# Patient Record
Sex: Male | Born: 1960 | Race: Black or African American | Hispanic: No | Marital: Single | Smoking: Current every day smoker
Health system: Southern US, Community
[De-identification: ages and names within clinical notes are randomized; demographics above are authoritative.]

## PROBLEM LIST (undated history)

## (undated) DIAGNOSIS — I10 Essential (primary) hypertension: Secondary | ICD-10-CM

## (undated) DIAGNOSIS — E119 Type 2 diabetes mellitus without complications: Secondary | ICD-10-CM

## (undated) DIAGNOSIS — D649 Anemia, unspecified: Secondary | ICD-10-CM

## (undated) DIAGNOSIS — N186 End stage renal disease: Secondary | ICD-10-CM

## (undated) DIAGNOSIS — I739 Peripheral vascular disease, unspecified: Secondary | ICD-10-CM

## (undated) DIAGNOSIS — Z992 Dependence on renal dialysis: Secondary | ICD-10-CM

## (undated) DIAGNOSIS — M25512 Pain in left shoulder: Secondary | ICD-10-CM

## (undated) HISTORY — PX: APPENDECTOMY: SHX54

## (undated) HISTORY — PX: OTHER SURGICAL HISTORY: SHX169

---

## 1998-10-02 ENCOUNTER — Emergency Department (HOSPITAL_COMMUNITY): Admission: EM | Admit: 1998-10-02 | Discharge: 1998-10-02 | Payer: Self-pay | Admitting: Emergency Medicine

## 1998-10-02 ENCOUNTER — Encounter: Payer: Self-pay | Admitting: Emergency Medicine

## 1999-08-10 ENCOUNTER — Encounter: Payer: Self-pay | Admitting: Emergency Medicine

## 1999-08-10 ENCOUNTER — Emergency Department (HOSPITAL_COMMUNITY): Admission: EM | Admit: 1999-08-10 | Discharge: 1999-08-10 | Payer: Self-pay | Admitting: Emergency Medicine

## 1999-08-30 ENCOUNTER — Ambulatory Visit (HOSPITAL_COMMUNITY): Admission: RE | Admit: 1999-08-30 | Discharge: 1999-08-30 | Payer: Self-pay | Admitting: Family Medicine

## 1999-08-30 ENCOUNTER — Encounter: Payer: Self-pay | Admitting: Family Medicine

## 1999-09-19 ENCOUNTER — Ambulatory Visit (HOSPITAL_BASED_OUTPATIENT_CLINIC_OR_DEPARTMENT_OTHER): Admission: RE | Admit: 1999-09-19 | Discharge: 1999-09-20 | Payer: Self-pay | Admitting: Orthopedic Surgery

## 2001-04-30 ENCOUNTER — Emergency Department (HOSPITAL_COMMUNITY): Admission: EM | Admit: 2001-04-30 | Discharge: 2001-04-30 | Payer: Self-pay | Admitting: Emergency Medicine

## 2001-04-30 ENCOUNTER — Encounter: Payer: Self-pay | Admitting: Emergency Medicine

## 2004-12-07 ENCOUNTER — Emergency Department (HOSPITAL_COMMUNITY): Admission: EM | Admit: 2004-12-07 | Discharge: 2004-12-07 | Payer: Self-pay | Admitting: Emergency Medicine

## 2006-02-18 ENCOUNTER — Emergency Department (HOSPITAL_COMMUNITY): Admission: EM | Admit: 2006-02-18 | Discharge: 2006-02-18 | Payer: Self-pay | Admitting: Emergency Medicine

## 2009-12-31 ENCOUNTER — Emergency Department (HOSPITAL_COMMUNITY)
Admission: EM | Admit: 2009-12-31 | Discharge: 2009-12-31 | Payer: Self-pay | Source: Home / Self Care | Admitting: Emergency Medicine

## 2009-12-31 ENCOUNTER — Ambulatory Visit: Payer: Self-pay | Admitting: Vascular Surgery

## 2010-05-17 ENCOUNTER — Emergency Department (HOSPITAL_COMMUNITY)
Admission: EM | Admit: 2010-05-17 | Discharge: 2010-05-17 | Payer: Self-pay | Source: Home / Self Care | Admitting: Emergency Medicine

## 2010-05-20 LAB — CBC
MCH: 32.7 pg (ref 26.0–34.0)
MCHC: 34 g/dL (ref 30.0–36.0)
Platelets: 179 10*3/uL (ref 150–400)
WBC: 9.8 10*3/uL (ref 4.0–10.5)

## 2010-05-20 LAB — COMPREHENSIVE METABOLIC PANEL
AST: 64 U/L — ABNORMAL HIGH (ref 0–37)
CO2: 25 mEq/L (ref 19–32)
Calcium: 9.5 mg/dL (ref 8.4–10.5)
Chloride: 107 mEq/L (ref 96–112)
Creatinine, Ser: 0.87 mg/dL (ref 0.4–1.5)
GFR calc Af Amer: >60 mL/min (ref >60)
GFR calc non Af Amer: >60 mL/min (ref >60)

## 2010-05-20 LAB — DIFFERENTIAL
Basophils Absolute: 0 10*3/uL (ref 0.0–0.1)
Basophils Relative: 0 % (ref 0–1)
Lymphocytes Relative: 48 % — ABNORMAL HIGH (ref 12–46)
Lymphs Abs: 4.7 10*3/uL — ABNORMAL HIGH (ref 0.7–4.0)
Monocytes Absolute: 0.7 10*3/uL (ref 0.1–1.0)
Monocytes Relative: 7 % (ref 3–12)
Neutro Abs: 4.2 10*3/uL (ref 1.7–7.7)
Neutrophils Relative %: 43 % (ref 43–77)

## 2010-05-20 LAB — PROTIME-INR: INR: 0.92 (ref 0.00–1.49)

## 2010-07-11 LAB — POCT I-STAT, CHEM 8
Calcium, Ion: 1.15 mmol/L (ref 1.12–1.32)
Glucose, Bld: 135 mg/dL — ABNORMAL HIGH (ref 70–99)
Sodium: 140 mEq/L (ref 135–145)
TCO2: 20 mmol/L (ref 0–100)

## 2010-07-11 LAB — URINALYSIS, ROUTINE W REFLEX MICROSCOPIC

## 2010-09-13 NOTE — Op Note (Signed)
[place]  Patient:    Daniel [name], Daniel [name]                   MRN: [id number] Proc. Date: 09/19/99 Adm. Date:  [id number] Disc. Date: [id number] Attending:  Meriel Flavors                           Operative Report  PREOPERATIVE DIAGNOSIS:  Displaced comminuted depressed intra-articular os calcis fracture, left heel.  POSTOPERATIVE DIAGNOSIS:  Displaced comminuted depressed intra-articular os calcis fracture, left heel.  OPERATIVE PROCEDURE:  Open reduction and internal fixation of os calcis fracture, left utilizing titanium os calcis 12-hole plate with ten interfragmentary 4.0 screws.  SURGEON:  Ninetta Lights, M.D.  ASSISTANT:  Aaron Edelman D. Petrarca, P.A.-C.  ANESTHESIA:  General.  BLOOD LOSS:  Minimal.  TOURNIQUET TIME:  One hour and 15 minutes.  SPECIMENS:  None.  CULTURES:  None.  COMPLICATIONS:  None.  DRESSING:  Self-compressive.  DESCRIPTION OF PROCEDURE:  The patient was brought to the operating room and placed on the operating table in the supine position.  After adequate anesthesia had been obtained, turned to appropriate position with appropriate padding and support.  Prepped and draped in the usual sterile fashion. Exsanguinated with elevation and Esmarch.  Tourniquet inflated to 200 mmHg. An L-shaped incision was made under the lateral aspect of the foot, standard approach for os calcis fracture with the incision carried all the way down to the bone, and then the soft tissue sleeve being left as big as possible. Peroneal tendon and sural nerve identified and protected.  Subperiosteal exposure of the entire lateral wall of the os calcis to allow for reduction. Small fluoroscopy used for guidance.  The main intra-articular fragment at the subtalar joint which had been significantly depressed was elevated from below, bringing it up and restoring Bohler angle to an appropriate positive position. I then put a large  Steinmann pin through the base of the os calcis in order to tilt the os calcis back over into a normal slight valgus position from the 20 degrees of varus that it was sitting in post-traumatically.  We then were able to use the fragments on the lateral side and compress these in to restore the normal width of the heel and provide some bony support underneath the elevated intra-articular fragment.  I could get up into the subtalar joint _______ wound to be sure that I was at least reasonably congruent at completion.  A Depuy plate was then placed on the lateral wall of the os calcis, utilizing the small os calcis 12-hole plate.  A series of interfragmentary screws were then placed in order to bring the plate down solidly on the lateral wall of the os calcis, and to interlock this all the way over to the medial wall with the screws in place.  At completion, I had a positive Bohlers angle.  I had restored the height and alignment of the heel to a very reasonable position on all views fluoroscopically as well as clinically.  The wounds were then irrigated and closed with Vicryl and nylon.  Margins of the wound injected with Marcaine.  A sterile compressive bulky trauma dressing applied.  Tourniquet deflated and removed.  Returned to the supine position.  Anesthesia reversed.  Left to the recovery room.  Tolerated surgery well with no complications. DD:  09/20/99 TD:  09/24/99 Job: [id number]

## 2010-09-13 NOTE — Op Note (Signed)
Daniel [name]. [facility]  Patient:    BUDD, LUCKE                   MRN: [id number] Proc. Date: 09/19/99 Adm. Date:  [id number] Disc. Date: [id number] Attending:  Meriel Flavors                           Operative Report  PREOPERATIVE DIAGNOSIS:  Displaced, comminuted, depressed intra-articular os calcis fracture, left heel.  POSTOPERATIVE DIAGNOSIS:  Displaced, comminuted, depressed intra-articular os calcis fracture, left heel.  PROCEDURE:  Open reduction and internal fixation of left os calcis fracture utilizing DePuy os calcis titanium 12-hole plate and 10 interfragmentary screws.  SURGEON:  Ninetta Lights, M.D.  ASSISTANT:  Aaron Edelman D. Petrarca, P.A.-C.  ANESTHESIA:  General.  ESTIMATED BLOOD LOSS:  Minimal.  TOURNIQUET TIME:  1 hour 15 minutes.  SPECIMENS:  None.  CULTURES:  None.  COMPLICATIONS:  None.  DRESSINGS:  Soft compressive with a bulky trauma dressing and splint.  DESCRIPTION OF PROCEDURE:  The patient was brought to the operating room and after adequate anesthesia had been obtained, turned to a prone position with appropriate padding and support.  Tourniquet had been placed about the upper aspect of the left thigh.  Prepped and draped in the usual sterile fashion. Exsanguinated with Esmarch, tourniquet inflated to 350 mmHg.  An L-shaped appropriate trauma incision made over the os calcis, extending from halfway between the fibula and Achilles tendon for the superior limb and halfway between the lateral malleolus and the bottom of the foot for the inferior limb.  The flap was kept very thick, the incision taken down to bone, and then subperiosteal exposure of the os calcis, protecting the peroneal tendons and the sural nerve.  The os calcis was exposed.  This was extended to expose the subtalar joint.  The comminuted fragments were identified.  The main portion of the intra-articular fragment, which was making up a  good 90% of the subtalar joint, was elevated back up to a reasonable position.  A large Steinmann pin was put in the more posteroinferior fragment to pull traction down on this fragment.  I was able then with indirect means to realign the medial and lateral walls and allow compression of these to restore the heel height and appropriate slight valgus position of the os calcis, taking it out of the varus position that it was in from trauma.  With this technique, I could restore the os calcis to a reasonable alignment and recreate a positive Bohlers angle and a reasonably congruent subtalar joint surface.  The lateral wall fragments were then used as graft under the articular fragment and compressed in to restore the normal width of the os calcis.  A 12-hole os calcis plate was then fashioned and placed on the lateral wall of the os calcis and interfragmentary screws were passed through the plate, over to the medial bony cortex.  The large Steinmann pin removed.  All traction removed. Overall alignment assessed clinically as well as with x-ray on an AP and lateral plane and was reasonable and acceptable.  The wound was irrigated and closed with Vicryl and nylon.  Margins of the wound injected with Marcaine. Bulky dressing, trauma dressing, and splint applied.  Tourniquet deflated and removed.  Returned to supine position.  Anesthesia reversed, brought to the recovery room.  Tolerated surgery well with no complications. DD:  09/24/99 TD:  09/26/99 Job:  [id number]

## 2011-09-24 ENCOUNTER — Telehealth: Payer: Self-pay

## 2011-09-24 NOTE — Telephone Encounter (Signed)
LMOM to call.

## 2011-09-25 ENCOUNTER — Telehealth: Payer: Self-pay | Admitting: Gastroenterology

## 2011-09-25 ENCOUNTER — Other Ambulatory Visit: Payer: Self-pay

## 2011-09-25 DIAGNOSIS — Z139 Encounter for screening, unspecified: Secondary | ICD-10-CM

## 2011-09-25 NOTE — Telephone Encounter (Signed)
Called the number [phone] and was given the number of [phone]. Called and got some of the info. Pt has just been diagnosed with diabetes. He hasn't started his meds yet. He will call me back with that info and his blood pressure med. He said he does not have insurance and will be a self pay.

## 2011-09-25 NOTE — Telephone Encounter (Signed)
See phone note of 09/24/2011

## 2011-09-25 NOTE — Telephone Encounter (Signed)
Gastroenterology Pre-Procedure Form  Pt does not have insurance  Request Date: 09/25/2011       Requesting Physician: Dr. Legrand Rams     PATIENT INFORMATION:  Daniel [name] is a 51 y.o., male (DOB=08-29-1960).  PROCEDURE: Procedure(s) requested: colonoscopy Procedure Reason: screening for colon cancer  PATIENT REVIEW QUESTIONS: The patient reports the following:   1. Diabetes Melitis: yes  2. Joint replacements in the past 12 months: no 3. Major health problems in the past 3 months: no 4. Has an artificial valve or MVP:no 5. Has been advised in past to take antibiotics in advance of a procedure like teeth cleaning: no}    MEDICATIONS & ALLERGIES:    Patient reports the following regarding taking any blood thinners:   Plavix? no Aspirin?no Coumadin?  no  Patient confirms/reports the following medications:  Current Outpatient Prescriptions  Medication Sig Dispense Refill  . amLODipine (NORVASC) 5 MG tablet Take 5 mg by mouth daily.      [name] lisinopril (PRINIVIL,ZESTRIL) 20 MG tablet Take 20 mg by mouth daily.      . metFORMIN (GLUCOPHAGE) 500 MG tablet Take 500 mg by mouth daily with breakfast.      . sildenafil (VIAGRA) 100 MG tablet Take 100 mg by mouth as needed.        Patient confirms/reports the following allergies:  No Known Allergies  Patient is appropriate to schedule for requested procedure(s): yes  AUTHORIZATION INFORMATION Primary Insurance:   ID #:  Group #:  Pre-Cert / Auth required: Pre-Cert / Auth #:   Secondary Insurance:   ID #:   Group #:  Pre-Cert / Auth required:  Pre-Cert / Auth #:   No orders of the defined types were placed in this encounter.    SCHEDULE INFORMATION: Procedure has been scheduled as follows:  Date: 10/10/2011           Time: 8:30 AM  Location: [facility] Short Stay  This Gastroenterology Pre-Precedure Form is being routed to the following provider(s) for review: Barney Drain, MD

## 2011-09-25 NOTE — Telephone Encounter (Signed)
Called the number and was told it is not his number and they do not know him.

## 2011-09-25 NOTE — Telephone Encounter (Signed)
Pt returned call to schedule TCS- he can be reached at [phone]

## 2011-09-26 NOTE — Telephone Encounter (Signed)
Continue GLUCOPHAGE.  MOVI PREP SPLIT DOSING, REGULAR BREAKFAST. CLEAR LIQUIDS AFTER 9 AM.

## 2011-09-26 NOTE — Telephone Encounter (Signed)
REVIEWED.  

## 2011-09-29 NOTE — Telephone Encounter (Signed)
Rx and instructions mailed to pt.  

## 2011-10-06 ENCOUNTER — Encounter (HOSPITAL_COMMUNITY): Payer: Self-pay | Admitting: Pharmacy Technician

## 2011-10-08 ENCOUNTER — Telehealth: Payer: Self-pay

## 2011-10-08 NOTE — Telephone Encounter (Signed)
Pt called to cancel colonoscopy appt on 10/10/2011. He needs to reschedule in August (first week). Informed Kim in Endo. And pt on recall list for first week of July to get scheduled for first week of August.

## 2011-10-10 ENCOUNTER — Ambulatory Visit (HOSPITAL_COMMUNITY): Admission: RE | Admit: 2011-10-10 | Payer: Self-pay | Source: Ambulatory Visit | Admitting: Gastroenterology

## 2011-10-10 ENCOUNTER — Encounter (HOSPITAL_COMMUNITY): Admission: RE | Payer: Self-pay | Source: Ambulatory Visit

## 2011-10-10 SURGERY — COLONOSCOPY
Anesthesia: Moderate Sedation

## 2011-11-13 ENCOUNTER — Telehealth: Payer: Self-pay

## 2011-11-13 DIAGNOSIS — Z139 Encounter for screening, unspecified: Secondary | ICD-10-CM

## 2011-11-14 ENCOUNTER — Other Ambulatory Visit: Payer: Self-pay

## 2011-11-14 DIAGNOSIS — Z139 Encounter for screening, unspecified: Secondary | ICD-10-CM

## 2011-11-14 NOTE — Telephone Encounter (Signed)
CONTINUE GLUCOPHAGE.  PREPOPIK-DRINK WATER TO KEEP URINE LIGHT YELLOW.  PT SHOULD DROP OFF RX 3 DAYS PRIOR TO PROCEDURE.

## 2011-11-14 NOTE — Telephone Encounter (Addendum)
Gastroenterology Pre-Procedure Form     Request Date: 11/13/2011    Requesting Physician: Dr. Legrand Rams     PATIENT INFORMATION:  Daniel [name] is a 51 y.o., male (DOB=1960/09/01).  PROCEDURE: Procedure(s) requested: colonoscopy Procedure Reason: screening for colon cancer  PATIENT REVIEW QUESTIONS: The patient reports the following:   1. Diabetes Melitis: yes  2. Joint replacements in the past 12 months: no 3. Major health problems in the past 3 months: no 4. Has an artificial valve or MVP:no 5. Has been advised in past to take antibiotics in advance of a procedure like teeth cleaning: no}    MEDICATIONS & ALLERGIES:    Patient reports the following regarding taking any blood thinners:   Plavix? no Aspirin?no Coumadin?  no  Patient confirms/reports the following medications:  Current Outpatient Prescriptions  Medication Sig Dispense Refill  . amLODipine (NORVASC) 5 MG tablet Take 5 mg by mouth daily.      [name] lisinopril (PRINIVIL,ZESTRIL) 20 MG tablet Take 20 mg by mouth daily.      . metFORMIN (GLUCOPHAGE) 500 MG tablet Take 500 mg by mouth daily with breakfast.      . naphazoline (CLEAR EYES) 0.012 % ophthalmic solution Place 1 drop into both eyes daily as needed. Dry, Red Eyes      . sildenafil (VIAGRA) 100 MG tablet Take 100 mg by mouth as needed. Sexual Arousal        Patient confirms/reports the following allergies:  No Known Allergies  Patient is appropriate to schedule for requested procedure(s): yes  PT SAID HE IS SELF PAY  AUTHORIZATION INFORMATION Primary Insurance:   ID #:   Group #:  Pre-Cert / Auth required: Pre-Cert / Auth #:  Secondary Insurance:   ID #:   Group #:  Pre-Cert / Auth required:  Pre-Cert / Auth #:   No orders of the defined types were placed in this encounter.    SCHEDULE INFORMATION: Procedure has been scheduled as follows:  Date: 12/12/2011     Time: 8:30 AM  Location: [facility] Short Stay  This Gastroenterology  Pre-Precedure Form is being routed to the following provider(s) for review: Barney Drain, MD    Note added to drink lots of liquids to keep urine light yellow.

## 2011-11-19 MED ORDER — SOD PICOSULFATE-MAG OX-CIT ACD 10-3.5-12 MG-GM-GM PO PACK: 1.0000 | PACK | Freq: Once | ORAL | Status: DC

## 2011-11-19 NOTE — Telephone Encounter (Signed)
Rx and presciption mailed to pt . Note to take prescription to the pharmacy at least 3 days prior to procedure.

## 2011-11-19 NOTE — Addendum Note (Signed)
Addended by: Everardo All on: 11/19/2011 10:11 AM   Modules accepted: Orders

## 2011-11-28 ENCOUNTER — Encounter (HOSPITAL_COMMUNITY): Payer: Self-pay | Admitting: Pharmacy Technician

## 2011-12-10 ENCOUNTER — Telehealth: Payer: Self-pay | Admitting: Gastroenterology

## 2011-12-10 NOTE — Telephone Encounter (Signed)
Pt's wife called this afternoon. 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 He will not be able to do his procedure in the morning and would like to [facility] it for next Friday. Please call the wife back at [phone]

## 2011-12-11 NOTE — Telephone Encounter (Signed)
Called Kim to cancel his procedure. Called Pt's wife to let her know that both doctors are off next Friday but Tamela Oddi will call your next week to [facility] him.

## 2011-12-12 ENCOUNTER — Encounter (HOSPITAL_COMMUNITY): Admission: RE | Payer: Self-pay | Source: Ambulatory Visit

## 2011-12-12 ENCOUNTER — Ambulatory Visit (HOSPITAL_COMMUNITY): Admission: RE | Admit: 2011-12-12 | Payer: Self-pay | Source: Ambulatory Visit | Admitting: Gastroenterology

## 2011-12-12 SURGERY — COLONOSCOPY
Anesthesia: Moderate Sedation

## 2011-12-15 NOTE — Telephone Encounter (Signed)
LMOM to call.

## 2011-12-16 NOTE — Telephone Encounter (Signed)
LMOM to call.

## 2011-12-17 ENCOUNTER — Telehealth: Payer: Self-pay | Admitting: *Deleted

## 2011-12-17 ENCOUNTER — Other Ambulatory Visit: Payer: Self-pay

## 2011-12-17 DIAGNOSIS — Z139 Encounter for screening, unspecified: Secondary | ICD-10-CM

## 2011-12-17 NOTE — Telephone Encounter (Signed)
Ms Tia called today to set up a colonoscopy for her husband. She is receiving chemo and will be in and out, but would like for you to call her back. Thanks.

## 2011-12-18 NOTE — Telephone Encounter (Signed)
Pt is rescheduled for 01/23/2012 @ 11:00 AM with Dr. Oneida Alar. Will route to her to address his diabetics meds for instructions on his prep.

## 2011-12-18 NOTE — Telephone Encounter (Signed)
Pt is rescheduled for 01/23/2012 at 11:00 AM with Dr. Oneida Alar.

## 2011-12-18 NOTE — Telephone Encounter (Signed)
REVIEWED.  

## 2011-12-31 ENCOUNTER — Telehealth: Payer: Self-pay

## 2011-12-31 NOTE — Telephone Encounter (Signed)
Gastroenterology Pre-Procedure Form   Pt is self pay. ( Had previous Rx for Prepopik) Wife Lenell Antu gave info for triage  Request Date: 12/31/2011      Requesting Physician: Legrand Rams     PATIENT INFORMATION:  Daniel [name] is a 51 y.o., male (DOB=August 18, 1960).  PROCEDURE: Procedure(s) requested: colonoscopy Procedure Reason: screening for colon cancer  PATIENT REVIEW QUESTIONS: The patient reports the following:   1. Diabetes Melitis: yes  2. Joint replacements in the past 12 months: no 3. Major health problems in the past 3 months: no 4. Has an artificial valve or MVP:no 5. Has been advised in past to take antibiotics in advance of a procedure like teeth cleaning: no}    MEDICATIONS & ALLERGIES:    Patient reports the following regarding taking any blood thinners:   Plavix? No Aspirin?no Coumadin?  no  Patient confirms/reports the following medications:  Current Outpatient Prescriptions  Medication Sig Dispense Refill  . amLODipine (NORVASC) 5 MG tablet Take 5 mg by mouth daily.      [name] lisinopril (PRINIVIL,ZESTRIL) 20 MG tablet Take 20 mg by mouth daily.      . metFORMIN (GLUCOPHAGE) 500 MG tablet Take 500 mg by mouth daily with breakfast.      . naphazoline (CLEAR EYES) 0.012 % ophthalmic solution Place 1 drop into both eyes daily as needed. Dry, Red Eyes      . sildenafil (VIAGRA) 100 MG tablet Take 100 mg by mouth as needed. Sexual Arousal      . Sod Picosulfate-Mag Ox-Cit Acd 10-3.5-12 MG-GM-GM PACK Take 1 kit by mouth once.  1 each  0    Patient confirms/reports the following allergies:  No Known Allergies  Patient is appropriate to schedule for requested procedure(s): yes  AUTHORIZATION INFORMATION Primary Insurance:   ID #:   Group #:  Pre-Cert / Auth required: Pre-Cert / Auth #:   Secondary Insurance:   ID #:  Group #Pre-Cert / Auth required:  Pre-Cert / Auth #:   No orders of the defined types were placed in this encounter.    SCHEDULE  INFORMATION: Procedure has been scheduled as follows:  Date: 01/23/2012      Time: 11:00 AM  Location: [facility] Short Stay  This Gastroenterology Pre-Precedure Form is being routed to the following provider(s) for review: Barney Drain, MD

## 2012-01-01 NOTE — Telephone Encounter (Signed)
PT MAY HAVE PREPOPIK SAMPLE.

## 2012-01-01 NOTE — Telephone Encounter (Signed)
LMOM that sample of Prep and instructions are at front for pick up.

## 2012-01-07 NOTE — Telephone Encounter (Signed)
Called and informed pt of prep and instructions at front desk for pick up.

## 2012-01-23 ENCOUNTER — Ambulatory Visit (HOSPITAL_COMMUNITY)
Admission: RE | Admit: 2012-01-23 | Discharge: 2012-01-23 | Disposition: A | Discharge status: Home / Self Care | Payer: Self-pay | Source: Ambulatory Visit | Attending: Gastroenterology | Admitting: Gastroenterology

## 2012-01-23 ENCOUNTER — Encounter (HOSPITAL_COMMUNITY)
Admission: RE | Disposition: A | Discharge status: Home / Self Care | Payer: Self-pay | Source: Ambulatory Visit | Attending: Gastroenterology

## 2012-01-23 ENCOUNTER — Encounter (HOSPITAL_COMMUNITY): Payer: Self-pay | Admitting: *Deleted

## 2012-01-23 DIAGNOSIS — Z1211 Encounter for screening for malignant neoplasm of colon: Secondary | ICD-10-CM

## 2012-01-23 DIAGNOSIS — K5730 Diverticulosis of large intestine without perforation or abscess without bleeding: Secondary | ICD-10-CM

## 2012-01-23 DIAGNOSIS — Z139 Encounter for screening, unspecified: Secondary | ICD-10-CM

## 2012-01-23 DIAGNOSIS — K648 Other hemorrhoids: Secondary | ICD-10-CM

## 2012-01-23 DIAGNOSIS — I10 Essential (primary) hypertension: Secondary | ICD-10-CM | POA: Insufficient documentation

## 2012-01-23 HISTORY — DX: Essential (primary) hypertension: I10

## 2012-01-23 HISTORY — PX: COLONOSCOPY: SHX5424

## 2012-01-23 LAB — GLUCOSE, CAPILLARY: Glucose-Capillary: 124 mg/dL — ABNORMAL HIGH (ref 70–99)

## 2012-01-23 SURGERY — COLONOSCOPY
Anesthesia: Moderate Sedation

## 2012-01-23 MED ORDER — SODIUM CHLORIDE 0.45 % IV SOLN
INTRAVENOUS | Status: DC
  Administered 2012-01-23: 10:00:00 via INTRAVENOUS

## 2012-01-23 MED ORDER — STERILE WATER FOR IRRIGATION IR SOLN
Status: DC | PRN
  Administered 2012-01-23: 10:00:00

## 2012-01-23 MED ORDER — MIDAZOLAM HCL 5 MG/5ML IJ SOLN
INTRAMUSCULAR | Status: AC
  Filled 2012-01-23: qty 10

## 2012-01-23 MED ORDER — MEPERIDINE HCL 100 MG/ML IJ SOLN
INTRAMUSCULAR | Status: DC | PRN
  Administered 2012-01-23: 50 mg via INTRAVENOUS
  Administered 2012-01-23: 25 mg via INTRAVENOUS

## 2012-01-23 MED ORDER — MEPERIDINE HCL 100 MG/ML IJ SOLN
INTRAMUSCULAR | Status: AC
  Filled 2012-01-23: qty 1

## 2012-01-23 MED ORDER — MIDAZOLAM HCL 5 MG/5ML IJ SOLN
INTRAMUSCULAR | Status: DC | PRN
  Administered 2012-01-23: 1 mg via INTRAVENOUS
  Administered 2012-01-23 (×2): 2 mg via INTRAVENOUS
  Administered 2012-01-23: 1 mg via INTRAVENOUS

## 2012-01-23 NOTE — Op Note (Signed)
[facility] [address] [place], [address]   COLONOSCOPY PROCEDURE REPORT  PATIENT: Daniel [name], Daniel [name]  MR#: [id number] BIRTHDATE: 06/29/1960 , 63  yrs. old GENDER: Male ENDOSCOPIST: Barney Drain, MD REFERRED [id number] Fanta, M.D. PROCEDURE DATE:  01/23/2012 PROCEDURE:   Colonoscopy, screening INDICATIONS:average risk patient for colon cancer. MEDICATIONS: Demerol 75 mg IV and Versed 6 mg IV  DESCRIPTION OF PROCEDURE:    Physical exam was performed.  Informed consent was obtained from the patient after explaining the benefits, risks, and alternatives to procedure.  The patient was connected to monitor and placed in left lateral position. Continuous oxygen was provided by nasal cannula and IV medicine administered through an indwelling cannula.  After administration of sedation and rectal exam, the patients rectum was intubated and the [id number])  colonoscope was advanced under direct visualization to the cecum.  The scope was removed slowly by carefully examining the color, texture, anatomy, and integrity mucosa on the way out.  The patient was recovered in endoscopy and discharged home in satisfactory condition.       COLON FINDINGS: Mild diverticulosis was noted in the descending colon and sigmoid colon.  , The colon was otherwise normal.  There was no inflammation, polyps or cancers unless previously stated.  , and Moderate sized internal hemorrhoids were found.  PREP QUALITY: good. CECAL W/D TIME: 12 minutes  COMPLICATIONS: None  ENDOSCOPIC IMPRESSION: 1.   Mild diverticulosis was noted in the descending colon and sigmoid colon 2.   The colon was otherwise normal 3.   Moderate sized internal hemorrhoids   RECOMMENDATIONS: HIGH FIBER DIET TCS IN 10 YEARS       _______________________________ eSignedBarney Drain, MD 01/23/2012 2:53 PM

## 2012-01-23 NOTE — H&P (Signed)
  Primary Care Physician:  Rosita Fire, MD Primary Gastroenterologist:  Dr. Oneida Alar  Pre-Procedure History & Physical: HPI:  Daniel [name] is a 51 y.o. male here for COLON CANCER SCREENING.   Past Medical History  Diagnosis Date  . Hypertension     Past Surgical History  Procedure Date  . Appendectomy   . Left heel surgery     Prior to Admission medications   Medication Sig Start Date End Date Taking? Authorizing Provider  amLODipine (NORVASC) 5 MG tablet Take 5 mg by mouth daily.   Yes Historical Provider, MD  lisinopril (PRINIVIL,ZESTRIL) 20 MG tablet Take 20 mg by mouth daily.   Yes Historical Provider, MD  metFORMIN (GLUCOPHAGE) 500 MG tablet Take 500 mg by mouth 2 (two) times daily.    Yes Historical Provider, MD  sildenafil (VIAGRA) 100 MG tablet Take 100 mg by mouth as needed. Sexual Arousal   Yes Historical Provider, MD  naphazoline (CLEAR EYES) 0.012 % ophthalmic solution Place 1 drop into both eyes daily as needed. Dry, Red Eyes    Historical Provider, MD    Allergies as of 12/17/2011  . (No Known Allergies)    Family History  Problem Relation Age of Onset  . Colon cancer Neg Hx     History   Social History  . Marital Status: Married    Spouse Name: N/A    Number of Children: N/A  . Years of Education: N/A   Occupational History  . Not on file.   Social History Main Topics  . Smoking status: Current Every Day Smoker -- 0.5 packs/day for 30 years  . Smokeless tobacco: Not on file  . Alcohol Use: 7.2 oz/week    12 Cans of beer per week  . Drug Use: No  . Sexually Active:    Other Topics Concern  . Not on file   Social History Narrative  . No narrative on file    Review of Systems: See HPI, otherwise negative ROS   Physical Exam: BP 168/101  Pulse 89  Temp 97.8 F (36.6 C) (Oral)  Resp 17  Ht 5\' 11"  (1.803 m)  Wt 275 lb (124.739 kg)  BMI 38.35 kg/m2  SpO2 95% General:   Alert,  pleasant and cooperative in NAD Head:   Normocephalic and atraumatic. Neck:  Supple; Lungs:  Clear throughout to auscultation.    Heart:  Regular rate and rhythm. Abdomen:  Soft, nontender and nondistended. Normal bowel sounds, without guarding, and without rebound.   Neurologic:  Alert and  oriented x4;  grossly normal neurologically.  Impression/Plan:     SCREENING  Plan:  1. TCS TODAY

## 2012-01-30 ENCOUNTER — Encounter (HOSPITAL_COMMUNITY): Payer: Self-pay | Admitting: Gastroenterology

## 2013-01-11 ENCOUNTER — Emergency Department (HOSPITAL_COMMUNITY)
Admission: EM | Admit: 2013-01-11 | Discharge: 2013-01-11 | Disposition: A | Discharge status: Home / Self Care | Payer: Self-pay | Attending: Emergency Medicine | Admitting: Emergency Medicine

## 2013-01-11 ENCOUNTER — Emergency Department (HOSPITAL_COMMUNITY): Payer: Self-pay

## 2013-01-11 ENCOUNTER — Encounter (HOSPITAL_COMMUNITY): Payer: Self-pay | Admitting: Emergency Medicine

## 2013-01-11 DIAGNOSIS — I10 Essential (primary) hypertension: Secondary | ICD-10-CM | POA: Insufficient documentation

## 2013-01-11 DIAGNOSIS — E119 Type 2 diabetes mellitus without complications: Secondary | ICD-10-CM | POA: Insufficient documentation

## 2013-01-11 DIAGNOSIS — Z79899 Other long term (current) drug therapy: Secondary | ICD-10-CM | POA: Insufficient documentation

## 2013-01-11 DIAGNOSIS — M7501 Adhesive capsulitis of right shoulder: Secondary | ICD-10-CM

## 2013-01-11 DIAGNOSIS — M7500 Adhesive capsulitis of unspecified shoulder: Secondary | ICD-10-CM | POA: Insufficient documentation

## 2013-01-11 DIAGNOSIS — F17200 Nicotine dependence, unspecified, uncomplicated: Secondary | ICD-10-CM | POA: Insufficient documentation

## 2013-01-11 HISTORY — DX: Type 2 diabetes mellitus without complications: E11.9

## 2013-01-11 MED ORDER — PROMETHAZINE HCL 25 MG PO TABS: 25.0000 mg | ORAL_TABLET | Freq: Four times a day (QID) | ORAL | Status: DC | PRN

## 2013-01-11 MED ORDER — ONDANSETRON 4 MG PO TBDP
8.0000 mg | ORAL_TABLET | Freq: Once | ORAL | Status: AC
  Administered 2013-01-11: 8 mg via ORAL
  Filled 2013-01-11: qty 2

## 2013-01-11 MED ORDER — OXYCODONE-ACETAMINOPHEN 5-325 MG PO TABS: 2.0000 | ORAL_TABLET | Freq: Four times a day (QID) | ORAL | Status: DC | PRN

## 2013-01-11 MED ORDER — OXYCODONE-ACETAMINOPHEN 5-325 MG PO TABS
2.0000 | ORAL_TABLET | Freq: Once | ORAL | Status: AC
  Administered 2013-01-11: 2 via ORAL
  Filled 2013-01-11: qty 2

## 2013-01-11 NOTE — ED Notes (Signed)
Patient states he started having bilateral arm and shoulder pain x 6 months ago.   He advised that is has gotten worse and he is now unable to move his R arm up and down.

## 2013-01-11 NOTE — ED Notes (Signed)
Provider at the bedside.  

## 2013-01-11 NOTE — ED Provider Notes (Signed)
CSN: [id number]     Arrival date & time 01/11/13  0720 History   First MD Initiated Contact with Patient 01/11/13 [phone]     Chief Complaint  Patient presents with  . Shoulder Pain  . Arm Pain   (Consider location/radiation/quality/duration/timing/severity/associated sxs/prior Treatment) HPI Comments: Patient is a 52 year old male with history of hypertension and diabetes who presents today with 6 months of gradually worsening shoulder pain. His pain is bilateral, but his right shoulder is worse than his left. The pain is an aching pain. He uses icy hot patches without relief. Movement makes his pain worse and he has recently not been able to lift his arm above his head. He reports that he has been seen by a doctor in [place] for this, but has not had any x-rays and cannot remember the doctor's name. He was told that if this gets worse to come back in. No initial injury. The pain sometimes radiates into his right hip. Currently his hip is not bothering him. No fevers, chills, headache, nausea, vomiting, abdominal pain, weakness, paresthesias.  The history is provided by the patient. No language interpreter was used.    Past Medical History  Diagnosis Date  . Hypertension    Past Surgical History  Procedure Laterality Date  . Appendectomy    . Left heel surgery    . Colonoscopy  01/23/2012    Procedure: COLONOSCOPY;  Surgeon: Danie Binder, MD;  [facility];  Service: Endoscopy;  Laterality: N/A;  11:10 AM   Family History  Problem Relation Age of Onset  . Colon cancer Neg Hx    History  Substance Use Topics  . Smoking status: Current Every Day Smoker -- 0.50 packs/day for 30 years  . Smokeless tobacco: Not on file  . Alcohol Use: 7.2 oz/week    12 Cans of beer per week    Review of Systems  Constitutional: Negative for fever and chills.  Respiratory: Negative for shortness of breath.   Cardiovascular: Negative for chest pain.  Gastrointestinal: Negative for nausea,  vomiting and abdominal pain.  Musculoskeletal: Positive for myalgias and arthralgias.  All other systems reviewed and are negative.    Allergies  Review of patient's allergies indicates no known allergies.  Home Medications   Current Outpatient Rx  Name  Route  Sig  Dispense  Refill  . amLODipine (NORVASC) 5 MG tablet   Oral   Take 5 mg by mouth daily.         [name] lisinopril (PRINIVIL,ZESTRIL) 20 MG tablet   Oral   Take 20 mg by mouth daily.         . metFORMIN (GLUCOPHAGE) 500 MG tablet   Oral   Take 500 mg by mouth 2 (two) times daily.          . naphazoline (CLEAR EYES) 0.012 % ophthalmic solution   Both Eyes   Place 1 drop into both eyes daily as needed. Dry, Red Eyes         . sildenafil (VIAGRA) 100 MG tablet   Oral   Take 100 mg by mouth as needed. Sexual Arousal          BP 193/119  Pulse 69  Temp(Src) 98.1 F (36.7 C) (Oral)  Resp 18  Ht 5\' 11"  (1.803 m)  Wt 250 lb (113.399 kg)  BMI 34.88 kg/m2  SpO2 96% Physical Exam  Nursing note and vitals reviewed. Constitutional: He is oriented to person, place, and time. He appears well-developed  and well-nourished.  Non-toxic appearance. He does not have a sickly appearance. He does not appear ill. No distress.  Laying with the right arm by his side and left arm above head  HENT:  Head: Normocephalic and atraumatic.  Right Ear: External ear normal.  Left Ear: External ear normal.  Nose: Nose normal.  Eyes: Conjunctivae are normal.  Neck: Normal range of motion. No tracheal deviation present.  Cardiovascular: Normal rate, regular rhythm, normal heart sounds, intact distal pulses and normal pulses.   Pulses:      Femoral pulses are 2+ on the right side, and 2+ on the left side. Pulmonary/Chest: Effort normal and breath sounds normal. No stridor.  Abdominal: Soft. He exhibits no distension. There is no tenderness.  Musculoskeletal: Normal range of motion.  Tender to palpation over right a.c. Joint.  Range of motion limited due to pain. Cap refill less than 3 seconds in all fingers. Neurovascularly intact. Sensation intact. Logroll test negative on right  Lymphadenopathy:       Right: No inguinal adenopathy present.       Left: No inguinal adenopathy present.  Neurological: He is alert and oriented to person, place, and time.  Skin: Skin is warm and dry. He is not diaphoretic.  Psychiatric: He has a normal mood and affect. His behavior is normal.    ED Course  Procedures (including critical care time) Labs Review Labs Reviewed - No data to display Imaging Review Dg Shoulder Right  01/11/2013   *RADIOLOGY REPORT*  Clinical Data: Right shoulder pain without injury  RIGHT SHOULDER - 2+ VIEW  Comparison: None.  Findings: No acute fracture or dislocation is noted. Degenerative changes of the acromioclavicular joint are seen.  Mild degenerative changes of the glenohumeral joint are noted as well.  No soft tissue abnormality is noted.  IMPRESSION: Mild degenerative changes without acute abnormality.   Original Report Authenticated By: Inez Catalina, M.D.    MDM   1. Adhesive capsulitis, right   2. Hypertension    Patient presents with right shoulder pain worse over the past 6 months. Now ROM limited in shoulder. Neurovascularly intact. Compartment soft. Sensation intact. XR shows mild degenerative changes without acute abnormality. Patient will be given pain medication and ortho follow up. He is hypertensive and has meds to take, but has not taken them recently. No headache, weakness, numbness, paresthesias. Return instructions given. Vital signs stable for discharge. Discussed case with Dr. Christy Gentles who agrees with plan. Patient / Family / Caregiver informed of clinical course, understand medical decision-making process, and agree with plan.     Elwyn Lade, PA-C 01/11/13 1517

## 2013-01-12 NOTE — ED Provider Notes (Signed)
Medical screening examination/treatment/procedure(s) were performed by non-physician practitioner and as supervising physician I was immediately available for consultation/collaboration.   Sharyon Cable, MD 01/12/13 1145

## 2013-06-28 ENCOUNTER — Encounter (HOSPITAL_COMMUNITY): Payer: Self-pay | Admitting: Emergency Medicine

## 2013-06-28 ENCOUNTER — Emergency Department (HOSPITAL_COMMUNITY)
Admission: EM | Admit: 2013-06-28 | Discharge: 2013-06-29 | Disposition: A | Discharge status: Home / Self Care | Payer: Self-pay | Attending: Emergency Medicine | Admitting: Emergency Medicine

## 2013-06-28 DIAGNOSIS — F1010 Alcohol abuse, uncomplicated: Secondary | ICD-10-CM | POA: Insufficient documentation

## 2013-06-28 DIAGNOSIS — Z5900 Homelessness unspecified: Secondary | ICD-10-CM | POA: Insufficient documentation

## 2013-06-28 DIAGNOSIS — E119 Type 2 diabetes mellitus without complications: Secondary | ICD-10-CM | POA: Insufficient documentation

## 2013-06-28 DIAGNOSIS — I10 Essential (primary) hypertension: Secondary | ICD-10-CM | POA: Insufficient documentation

## 2013-06-28 DIAGNOSIS — F17200 Nicotine dependence, unspecified, uncomplicated: Secondary | ICD-10-CM | POA: Insufficient documentation

## 2013-06-28 DIAGNOSIS — M25512 Pain in left shoulder: Secondary | ICD-10-CM

## 2013-06-28 HISTORY — DX: Pain in left shoulder: M25.512

## 2013-06-28 LAB — RAPID URINE DRUG SCREEN, HOSP PERFORMED
Amphetamines: NOT DETECTED
BARBITURATES: NOT DETECTED
Benzodiazepines: NOT DETECTED
Cocaine: POSITIVE — AB
Opiates: NOT DETECTED
TETRAHYDROCANNABINOL: NOT DETECTED

## 2013-06-28 LAB — CBC
HCT: 43.2 % (ref 39.0–52.0)
HEMOGLOBIN: 15.3 g/dL (ref 13.0–17.0)
MCH: 34.8 pg — ABNORMAL HIGH (ref 26.0–34.0)
MCHC: 35.4 g/dL (ref 30.0–36.0)
MCV: 98.2 fL (ref 78.0–100.0)
Platelets: 171 10*3/uL (ref 150–400)
RBC: 4.4 MIL/uL (ref 4.22–5.81)
RDW: 13.6 % (ref 11.5–15.5)
WBC: 13.5 10*3/uL — ABNORMAL HIGH (ref 4.0–10.5)

## 2013-06-28 LAB — COMPREHENSIVE METABOLIC PANEL
ALK PHOS: 68 U/L (ref 39–117)
ALT: 67 U/L — AB (ref 0–53)
AST: 110 U/L — ABNORMAL HIGH (ref 0–37)
Albumin: 3.6 g/dL (ref 3.5–5.2)
BUN: 16 mg/dL (ref 6–23)
CHLORIDE: 101 meq/L (ref 96–112)
CO2: 21 meq/L (ref 19–32)
Calcium: 9.2 mg/dL (ref 8.4–10.5)
Creatinine, Ser: 1.18 mg/dL (ref 0.50–1.35)
GFR, EST AFRICAN AMERICAN: 80 mL/min — AB (ref >90)
GFR, EST NON AFRICAN AMERICAN: 69 mL/min — AB (ref >90)
GLUCOSE: 127 mg/dL — AB (ref 70–99)
POTASSIUM: 4.1 meq/L (ref 3.7–5.3)
SODIUM: 138 meq/L (ref 137–147)
Total Bilirubin: 0.9 mg/dL (ref 0.3–1.2)
Total Protein: 8.4 g/dL — ABNORMAL HIGH (ref 6.0–8.3)

## 2013-06-28 LAB — ACETAMINOPHEN LEVEL

## 2013-06-28 LAB — ETHANOL: Alcohol, Ethyl (B): <11 mg/dL (ref 0–11)

## 2013-06-28 LAB — SALICYLATE LEVEL: Salicylate Lvl: <2 mg/dL — ABNORMAL LOW (ref 2.8–20.0)

## 2013-06-28 MED ORDER — ALUM & MAG HYDROXIDE-SIMETH 200-200-20 MG/5ML PO SUSP: 30.0000 mL | ORAL | Status: DC | PRN

## 2013-06-28 MED ORDER — LORAZEPAM 1 MG PO TABS: 0.0000 mg | ORAL_TABLET | Freq: Two times a day (BID) | ORAL | Status: DC

## 2013-06-28 MED ORDER — LORAZEPAM 1 MG PO TABS
0.0000 mg | ORAL_TABLET | Freq: Four times a day (QID) | ORAL | Status: DC
  Administered 2013-06-28: 1 mg via ORAL
  Filled 2013-06-28: qty 1

## 2013-06-28 MED ORDER — CLONIDINE HCL 0.2 MG PO TABS: 0.1000 mg | ORAL_TABLET | Freq: Two times a day (BID) | ORAL | Status: DC

## 2013-06-28 MED ORDER — CLONIDINE HCL 0.1 MG PO TABS
0.2000 mg | ORAL_TABLET | Freq: Once | ORAL | Status: AC
  Administered 2013-06-28: 0.2 mg via ORAL
  Filled 2013-06-28: qty 2

## 2013-06-28 MED ORDER — ONDANSETRON HCL 4 MG PO TABS: 4.0000 mg | ORAL_TABLET | Freq: Three times a day (TID) | ORAL | Status: DC | PRN

## 2013-06-28 MED ORDER — CLONIDINE HCL 0.1 MG PO TABS
0.1000 mg | ORAL_TABLET | Freq: Three times a day (TID) | ORAL | Status: DC
Start: 2013-06-28 — End: 2013-06-29
  Administered 2013-06-28: 0.1 mg via ORAL
  Filled 2013-06-28: qty 1

## 2013-06-28 MED ORDER — NICOTINE 21 MG/24HR TD PT24
21.0000 mg | MEDICATED_PATCH | Freq: Every day | TRANSDERMAL | Status: DC
  Administered 2013-06-28: 21 mg via TRANSDERMAL
  Filled 2013-06-28: qty 1

## 2013-06-28 MED ORDER — IBUPROFEN 200 MG PO TABS
600.0000 mg | ORAL_TABLET | Freq: Three times a day (TID) | ORAL | Status: DC | PRN
  Filled 2013-06-28: qty 3

## 2013-06-28 NOTE — ED Notes (Addendum)
Pt requesting [facility] detox.  Suicidal w/o plan. Last drink was last night (5) 40 oz beers.  Has been through detox before. No hx of seizures.

## 2013-06-28 NOTE — ED Provider Notes (Signed)
CSN: [id number]     Arrival date & time 06/28/13  0844 History   First MD Initiated Contact with Patient 06/28/13 [phone]     Chief Complaint  Patient presents with  . Medical Clearance     (Consider location/radiation/quality/duration/timing/severity/associated sxs/prior Treatment) The history is provided by the patient.   Patient here complaining of suicidal ideations without plan. He admits to drinking copious alcohol today as well as cocaine use. States that he drinks 5 40 ounce beers a day. Last check was yesterday. Denies abdominal pain. No vomiting or black or bloody stools. Denies any auditory or visual hallucinations. Has been in rehabilitation before in the past and that was approximately 3 years ago. Denies any prior history of DTs. No current tremors noted. History of this time because of recent job loss as well as he is now homeless Past Medical History  Diagnosis Date  . Hypertension   . Diabetes mellitus without complication    Past Surgical History  Procedure Laterality Date  . Appendectomy    . Left heel surgery    . Colonoscopy  01/23/2012    Procedure: COLONOSCOPY;  Surgeon: Danie Binder, MD;  [facility];  Service: Endoscopy;  Laterality: N/A;  11:10 AM   Family History  Problem Relation Age of Onset  . Colon cancer Neg Hx    History  Substance Use Topics  . Smoking status: Current Every Day Smoker -- 0.50 packs/day for 30 years    Types: Cigarettes  . Smokeless tobacco: Not on file  . Alcohol Use: 7.2 oz/week    12 Cans of beer per week    Review of Systems  All other systems reviewed and are negative.      Allergies  Review of patient's allergies indicates no known allergies.  Home Medications   Current Outpatient Rx  Name  Route  Sig  Dispense  Refill  . oxyCODONE-acetaminophen (PERCOCET/ROXICET) 5-325 MG per tablet   Oral   Take 2 tablets by mouth every 6 (six) hours as needed for pain.   12 tablet   0   . promethazine  (PHENERGAN) 25 MG tablet   Oral   Take 1 tablet (25 mg total) by mouth every 6 (six) hours as needed for nausea.   12 tablet   0    There were no vitals taken for this visit. Physical Exam  Nursing note and vitals reviewed. Constitutional: He is oriented to person, place, and time. He appears well-developed and well-nourished.  Non-toxic appearance. No distress.  HENT:  Head: Normocephalic and atraumatic.  Eyes: Conjunctivae, EOM and lids are normal. Pupils are equal, round, and reactive to light.  Neck: Normal range of motion. Neck supple. No tracheal deviation present. No mass present.  Cardiovascular: Normal rate, regular rhythm and normal heart sounds.  Exam reveals no gallop.   No murmur heard. Pulmonary/Chest: Effort normal and breath sounds normal. No stridor. No respiratory distress. He has no decreased breath sounds. He has no wheezes. He has no rhonchi. He has no rales.  Abdominal: Soft. Normal appearance and bowel sounds are normal. He exhibits no distension. There is no tenderness. There is no rebound and no CVA tenderness.  Musculoskeletal: Normal range of motion. He exhibits no edema and no tenderness.  Neurological: He is alert and oriented to person, place, and time. He has normal strength. No cranial nerve deficit or sensory deficit. GCS eye subscore is 4. GCS verbal subscore is 5. GCS motor subscore is 6.  Skin: Skin is warm and dry. No abrasion and no rash noted.  Psychiatric: His speech is normal. His affect is blunt. He is withdrawn. He expresses suicidal ideation. He expresses no suicidal plans and no homicidal plans.    ED Course  Procedures (including critical care time) Labs Review Labs Reviewed  ACETAMINOPHEN LEVEL  CBC  COMPREHENSIVE METABOLIC PANEL  ETHANOL  SALICYLATE LEVEL  URINE RAPID DRUG SCREEN (HOSP PERFORMED)   Imaging Review No results found.   EKG Interpretation None      MDM   Final diagnoses:  None    Patient is medically  cleared, place and ciwa and seen by behavior health assessment team    Leota Jacobsen, MD 06/28/13 [phone]

## 2013-06-28 NOTE — BH Assessment (Signed)
[place] Assessment Progress Note  At 23:27 [place] from [facility] called reporting that pt is accepted to their facility.  She requests that I call [employer] to have pt enrolled for third party payment.  She adds that she will take care of authorization.  I then called [employer] and spoke to [place].  She reports that [facility] is responsible both for enrollment and for authorization.  She agrees to call Sandy at [facility], then to call us back to let us know.  I informed her that Curlene Dolphin, [facility] will be assuming responsibility for pt at this point.  I then called EDP Dr Jeneen Rinks, who agrees to transfer to [facility].  Jalene Mullet, MA Triage Specialist 06/28/2013 @ 23:39

## 2013-06-28 NOTE — ED Provider Notes (Addendum)
Pt is still  hypertensive. Given clonidine. Pressures are improving. He has been accepted for residential treatment services. Final disposition pending.  Tanna Furry, MD 06/28/13 [id number]  Tanna Furry, MD 06/30/13 [phone]

## 2013-06-28 NOTE — BH Assessment (Signed)
Assessment Note  Daniel [name] is a 53 y.o. separated black male.  He presents at [facility] unaccompanied complaining of problems with alcohol and crack cocaine, as well as some SI.  Stressors: Pt reports that in 05/2012 he lost his job after failing a urine drug screen.  He anticipates receiving one final unemployment check.  He is eligible for reinstatement at work, but first must complete a substance abuse assessment, and the cost is prohibitive for him.  On Monday, 06/27/2013, he and his wife of the past 2.5 years separated by mutual decision with no intention to reconcile.  Pt is now living with his brother, but is uncertain whether he will be allowed to return.  Lethality: Suicidality: Pt endorses passive SI only, feeling that perhaps he would be better off dead.  He denies any thoughts of taking his life, and denies having a suicide plan.  He has never made a suicide attempt or engaged in self mutilation.  He endorses depressed mood and symptoms of depression as noted in the "risk to self" assessment below, but of not, he is not hopeless about the future, and is eager to "get my life back together." Homicidality: Pt denies homicidal thoughts or physical aggression.  Pt denies having access to firearms.  Pt denies having any legal problems at this time.  Pt is calm and cooperative during assessment. Psychosis: Pt denies hallucinations.  Pt does not appear to be responding to internal stimuli and exhibits no delusional thought.  Pt's reality testing appears to be intact. Substance Abuse: Pt reports that for the past 8 - 9 months he has been drinking about five 40 oz beers daily, with most recent use of six 40 oz beers yesterday (06/27/2013).  He has also been using about $100 worth of crack cocaine once or twice a month for an unspecified duration, with most recent use of $200 yesterday.  His first use of alcohol was at 53 y/o, and of cocaine at [age] y/o.  Pt denies any history of seizures or DT's,  but he reports a number of current withdrawal symptoms as noted in the "Additional Social History" section below, with a total CIWA score of 8.  Social History: Pt identifies an uncle, and brother, and a sister as social supports.  He continues to list his estranged wife, Forster Pamer [phone]) as his emergency contact.  He reports that he has a younger brother who drinks heavily, but he denies any history of mental health problems or suicidality in the family.  Pt denies any history of abuse.  He held a job with [employer] before being terminated.  His highest level of education is 11th grade.    Treatment History: Pt was admitted to [facility] for rehabilitation about 3 years ago, resulting in a 2 year period of sobriety, his longest ever.  About 10 years ago he was admitted to a rehab facility in [place] called [facility].  He denies any history of professional outpatient behavioral health treatment, but recently he attended 12-Step meetings for a couple months.  Today pt is seeking admission to a facility for detoxification.   Axis I: Alcohol Dependence 303.90; Cocaine Abuse 305.60; Mood Disorder NOS 296.90 Axis II: Deferred 799.9 Axis III:  Past Medical History  Diagnosis Date  . Hypertension   . Diabetes mellitus without complication   . Shoulder pain, left 06/28/2013   Axis IV: economic problems, housing problems, occupational problems and problems with primary support group Axis V: GAF =  47  Past Medical History:  Past Medical History  Diagnosis Date  . Hypertension   . Diabetes mellitus without complication   . Shoulder pain, left 06/28/2013    Past Surgical History  Procedure Laterality Date  . Appendectomy    . Left heel surgery    . Colonoscopy  01/23/2012    Procedure: COLONOSCOPY;  Surgeon: Danie Binder, MD;  [facility];  Service: Endoscopy;  Laterality: N/A;  11:10 AM    Family History:  Family History  Problem Relation Age of Onset  .  Colon cancer Neg Hx     Social History:  reports that he has been smoking Cigarettes.  He has a 30 pack-year smoking history. He has never used smokeless tobacco. He reports that he drinks about 7.2 ounces of alcohol per week. He reports that he uses illicit drugs ("Crack" cocaine).  Additional Social History:  Alcohol / Drug Use Pain Medications: Denies Prescriptions: Denies Over the Counter: Denies Negative Consequences of Use: Financial;Legal;Personal relationships;Work / School Withdrawal Symptoms: Nausea / Vomiting;Cramps;Tremors;Tingling;Fever / Chills;Sweats;Weakness;Other (Comment) (Visual sensitivity, headache) Substance #1 Name of Substance 1: Alcohol (beer) 1 - Age of First Use: 53 y/o 1 - Amount (size/oz): 200 oz 1 - Frequency: daily 1 - Duration: 8 - 9 months 1 - Last Use / Amount: 240 oz on  06/27/2013 Substance #2 Name of Substance 2: Cocaine (crack) 2 - Age of First Use: 4 or 52 y/o 2 - Amount (size/oz): $100 2 - Frequency: 1 - 2 times a month 2 - Duration: Unspecified 2 - Last Use / Amount: $200 o 06/27/2013  CIWA: CIWA-Ar BP: 178/107 mmHg Pulse Rate: 82 Nausea and Vomiting: mild nausea with no vomiting Tactile Disturbances: none Tremor: two Auditory Disturbances: not present Paroxysmal Sweats: no sweat visible Visual Disturbances: not present Anxiety: two Headache, Fullness in Head: moderate Agitation: normal activity Orientation and Clouding of Sensorium: oriented and can do serial additions CIWA-Ar Total: 8 COWS:    Allergies: No Known Allergies  Home Medications:  (Not in a hospital admission)  OB/GYN Status:  No LMP for male patient.  General Assessment Data Location of Assessment: [facility] ED Is this a Tele or Face-to-Face Assessment?: Face-to-Face Is this an Initial Assessment or a Re-assessment for this encounter?: Initial Assessment Living Arrangements: Other relatives (Brother, in brother's home) Can pt return to current living arrangement?:   (Uncertain) Admission Status: Voluntary Is patient capable of signing voluntary admission?: Yes Transfer from: [facility] Referral Source: Other ([facility])     [facility] Crisis Care Plan Living Arrangements: Other relatives (Brother, in brother's home) Name of Psychiatrist: None Name of Therapist: None  Education Status Is patient currently in school?: No Highest grade of school patient has completed: 11 Contact person: Atif Zappone (estranged wife) [phone]  Risk to self Suicidal Ideation: Yes-Currently Present (Passive only, feeling he would be better off dead.) Suicidal Intent: No Is patient at risk for suicide?: No Suicidal Plan?: No Access to Means: No What has been your use of drugs/alcohol within the last 12 months?: Daily alcohol, intermittent crack cocaine Previous Attempts/Gestures: No How many times?: 0 Other Self Harm Risks: Homelessness, unemployment, financial problems Triggers for Past Attempts: Other (Comment) (Not applicable) Intentional Self Injurious Behavior: None Family Suicide History: No Recent stressful life event(s): Job Loss;Financial Problems;Other (Comment) (Living w/ brother; separation from wife.) Persecutory voices/beliefs?: No Depression: Yes Depression Symptoms: Insomnia;Tearfulness;Isolating;Fatigue;Guilt;Loss of interest in usual pleasures;Feeling worthless/self pity;Feeling angry/irritable Substance abuse history and/or treatment for substance abuse?: Yes (Daily  alcohol, intermittent crack cocaine) Suicide prevention information given to non-admitted patients: Yes  Risk to Others Homicidal Ideation: No Thoughts of Harm to Others: No Current Homicidal Intent: No Current Homicidal Plan: No Access to Homicidal Means: No Identified Victim: None History of harm to others?: No Assessment of Violence: None Noted Violent Behavior Description: Calm/cooperative Does patient have access to weapons?: No (Denies having firearms.) Criminal  Charges Pending?: No Does patient have a court date: No  Psychosis Hallucinations: None noted Delusions: None noted  Mental Status Report Appear/Hygiene: Other (Comment);Disheveled (Paper scrubs) Eye Contact: Fair Motor Activity: Unremarkable Speech: Other (Comment) (Garbled, flat prosody) Level of Consciousness: Other (Comment) (Lethargic) Mood: Depressed Affect: Blunted Anxiety Level: None Thought Processes: Coherent;Relevant Judgement: Unimpaired Orientation: Person;Time;Place;Situation Obsessive Compulsive Thoughts/Behaviors: None  Cognitive Functioning Concentration: Decreased Memory: Recent Intact;Remote Intact IQ: Average Insight: Good Impulse Control: Good Appetite: Fair (Fluctuates) Weight Loss: 0 Weight Gain: 38 (35 - 40# over past 3 months.) Sleep: Decreased (Mid-insomnia, awakening 4 - 5 times a night) Total Hours of Sleep:  (Unspecified) Vegetative Symptoms: None  ADLScreening [facility] Assessment Services) Patient's cognitive ability adequate to safely complete daily activities?: Yes Patient able to express need for assistance with ADLs?: Yes Independently performs ADLs?: Yes (appropriate for developmental age)  Prior Inpatient Therapy Prior Inpatient Therapy: Yes Prior Therapy Dates: 3 years ago: [facility] for rehab Prior Therapy Facilty/Provider(s): 10 years ago: [facility] in [place] for rehab  Prior Outpatient Therapy Prior Outpatient Therapy: No Prior Therapy Dates: Denies any professional outpatient treatment Prior Therapy Facilty/Provider(s): Recent 2 month involvement with 12-Step meetings.  ADL Screening (condition at time of admission) Patient's cognitive ability adequate to safely complete daily activities?: Yes Is the patient deaf or have difficulty hearing?: No Does the patient have difficulty seeing, even when wearing glasses/contacts?: No Does the patient have difficulty concentrating, remembering, or making decisions?: No Patient able  to express need for assistance with ADLs?: Yes Does the patient have difficulty dressing or bathing?: No Independently performs ADLs?: Yes (appropriate for developmental age) Does the patient have difficulty walking or climbing stairs?: No Weakness of Legs: None Weakness of Arms/Hands: None  Home Assistive Devices/Equipment Home Assistive Devices/Equipment: CBG Meter (Reports that he needs reading glasses)    Abuse/Neglect Assessment (Assessment to be complete while patient is alone) Physical Abuse: Denies Verbal Abuse: Denies Sexual Abuse: Denies Exploitation of patient/patient's resources: Denies Self-Neglect: Denies Values / Beliefs Cultural Requests During Hospitalization: None Spiritual Requests During Hospitalization: None   Advance Directives (For Healthcare) Advance Directive: Patient does not have advance directive;Patient would not like information Pre-existing out of facility DNR order (yellow form or pink MOST form): No Nutrition Screen- [facility] Patient's home diet: Carb modified  Additional Information 1:1 In Past 12 Months?: No CIRT Risk: No Elopement Risk: No Does patient have medical clearance?: Yes     Disposition:  Disposition Initial Assessment Completed for this Encounter: Yes Disposition of Patient: Referred to Patient referred to: [facility] (Failing these, will consider at [facility].) After consulting with Patriciaann Clan, PA, it has been determined that pt does not present a life threatening danger to himself or others, but that he would benefit from facility-based detoxification from alcohol.  He recommends seeking placement for pt at [facility] or [facility].  Failing these, pt will be considered for admission to [facility].  At 20:56 I spoke to EDP Dr Jeneen Rinks, who concurs with opinion.  On Site Evaluation by:   Reviewed with Physician:  Patriciaann Clan, PA @ 20:52  Jalene Mullet, MA  Triage Specialist Abbe [place] 06/28/2013 9:27 PM

## 2013-06-28 NOTE — ED Notes (Signed)
Patient has elevated BP and will be cover per [facility] orders. Patient is asymptomatic and voices no complaints at this. Encouragement and support provided and safety maintain,

## 2013-06-28 NOTE — Progress Notes (Signed)
[facility] provided pt with a [employer], highlighting [facility] [place], to help patient establish primary care.

## 2013-06-28 NOTE — Discharge Instructions (Signed)
Alcohol Withdrawal °Anytime drug use is interfering with normal living activities it has become abuse. This includes problems with family and friends. Psychological dependence has developed when your mind tells you that the drug is needed. This is usually followed by physical dependence when a continuing increase of drugs are required to get the same feeling or "high." This is known as addiction or chemical dependency. A person's risk is much higher if there is a history of chemical dependency in the family. °Mild Withdrawal Following Stopping Alcohol, When Addiction or Chemical Dependency Has Developed °When a person has developed tolerance to alcohol, any sudden stopping of alcohol can cause uncomfortable physical symptoms. Most of the time these are mild and consist of tremors in the hands and increases in heart rate, breathing, and temperature. Sometimes these symptoms are associated with anxiety, panic attacks, and bad dreams. There may also be stomach upset. Normal sleep patterns are often interrupted with periods of inability to sleep (insomnia). This may last for 6 months. Because of this discomfort, many people choose to continue drinking to get rid of this discomfort and to try to feel normal. °Severe Withdrawal with Decreased or No Alcohol Intake, When Addiction or Chemical Dependency Has Developed °About five percent of alcoholics will develop signs of severe withdrawal when they stop using alcohol. One sign of this is development of generalized seizures (convulsions). Other signs of this are severe agitation and confusion. This may be associated with believing in things which are not real or seeing things which are not really there (delusions and hallucinations). Vitamin deficiencies are usually present if alcohol intake has been long-term. Treatment for this most often requires hospitalization and close observation. °Addiction can only be helped by stopping use of all chemicals. This is hard but may  save your life. With continual alcohol use, possible outcomes are usually loss of self respect and esteem, violence, and death. °Addiction cannot be cured but it can be stopped. This often requires outside help and the care of professionals. Treatment centers are listed in the yellow pages under Cocaine, Narcotics, and Alcoholics Anonymous. Most hospitals and clinics can refer you to a specialized care center. °It is not necessary for you to go through the uncomfortable symptoms of withdrawal. Your caregiver can provide you with medicines that will help you through this difficult period. Try to avoid situations, friends, or drugs that made it possible for you to keep using alcohol in the past. Learn how to say no. °It takes a long period of time to overcome addictions to all drugs, including alcohol. There may be many times when you feel as though you want a drink. After getting rid of the physical addiction and withdrawal, you will have a lessening of the craving which tells you that you need alcohol to feel normal. Call your caregiver if more support is needed. Learn who to talk to in your family and among your friends so that during these periods you can receive outside help. Alcoholics Anonymous (AA) has helped many people over the years. To get further help, contact AA or call your caregiver, counselor, or clergyperson. Al-Anon and Alateen are support groups for friends and family members of an alcoholic. The people who love and care for an alcoholic often need help, too. For information about these organizations, check your phone directory or call a local alcoholism treatment center.  °SEEK IMMEDIATE MEDICAL CARE IF:  °· You have a seizure. °· You have a fever. °· You experience uncontrolled vomiting or you   SEEK IMMEDIATE MEDICAL CARE IF:   You have a seizure.   You have a fever.   You experience uncontrolled vomiting or you vomit up blood. This may be bright red or look like black coffee grounds.   You have blood in the stool. This may be bright red or appear as a black, tarry, bad-smelling stool.   You become  lightheaded or faint. Do not drive if you feel this way. Have someone else drive you or call 911 for help.   You become more agitated or confused.   You develop uncontrolled anxiety.   You begin to see things that are not really there (hallucinate).  Your caregiver has determined that you completely understand your medical condition, and that your mental state is back to normal. You understand that you have been treated for alcohol withdrawal, have agreed not to drink any alcohol for a minimum of 1 day, will not operate a car or other machinery for 24 hours, and have had an opportunity to ask any questions about your condition.   Document Released: 01/22/2005 Document Revised: 07/07/2011 Document Reviewed: 12/01/2007   ExitCare Patient Information 2014 [employer].

## 2013-06-29 LAB — CBG MONITORING, ED: Glucose-Capillary: 110 mg/dL — ABNORMAL HIGH (ref 70–99)

## 2013-06-29 NOTE — ED Notes (Signed)
Patient discharge via ambulatory with a steady gait. No acute distress noted.

## 2015-08-21 ENCOUNTER — Emergency Department (HOSPITAL_COMMUNITY)
Admission: EM | Admit: 2015-08-21 | Discharge: 2015-08-21 | Disposition: A | Discharge status: Home / Self Care | Payer: Self-pay | Attending: Emergency Medicine | Admitting: Emergency Medicine

## 2015-08-21 ENCOUNTER — Emergency Department (HOSPITAL_COMMUNITY): Payer: Self-pay

## 2015-08-21 ENCOUNTER — Encounter (HOSPITAL_COMMUNITY): Payer: Self-pay | Admitting: Family Medicine

## 2015-08-21 DIAGNOSIS — J029 Acute pharyngitis, unspecified: Secondary | ICD-10-CM | POA: Insufficient documentation

## 2015-08-21 DIAGNOSIS — J028 Acute pharyngitis due to other specified organisms: Secondary | ICD-10-CM

## 2015-08-21 DIAGNOSIS — F17210 Nicotine dependence, cigarettes, uncomplicated: Secondary | ICD-10-CM | POA: Insufficient documentation

## 2015-08-21 DIAGNOSIS — Z79899 Other long term (current) drug therapy: Secondary | ICD-10-CM | POA: Insufficient documentation

## 2015-08-21 DIAGNOSIS — Z8739 Personal history of other diseases of the musculoskeletal system and connective tissue: Secondary | ICD-10-CM | POA: Insufficient documentation

## 2015-08-21 DIAGNOSIS — E119 Type 2 diabetes mellitus without complications: Secondary | ICD-10-CM | POA: Insufficient documentation

## 2015-08-21 DIAGNOSIS — B9789 Other viral agents as the cause of diseases classified elsewhere: Secondary | ICD-10-CM

## 2015-08-21 DIAGNOSIS — R1310 Dysphagia, unspecified: Secondary | ICD-10-CM | POA: Insufficient documentation

## 2015-08-21 DIAGNOSIS — R05 Cough: Secondary | ICD-10-CM

## 2015-08-21 DIAGNOSIS — I10 Essential (primary) hypertension: Secondary | ICD-10-CM | POA: Insufficient documentation

## 2015-08-21 DIAGNOSIS — F17200 Nicotine dependence, unspecified, uncomplicated: Secondary | ICD-10-CM

## 2015-08-21 DIAGNOSIS — R059 Cough, unspecified: Secondary | ICD-10-CM

## 2015-08-21 LAB — RAPID STREP SCREEN (MED CTR MEBANE ONLY): STREPTOCOCCUS, GROUP A SCREEN (DIRECT): NEGATIVE

## 2015-08-21 MED ORDER — IBUPROFEN 800 MG PO TABS
800.0000 mg | ORAL_TABLET | Freq: Once | ORAL | Status: AC
  Administered 2015-08-21: 800 mg via ORAL
  Filled 2015-08-21: qty 1

## 2015-08-21 MED ORDER — BENZONATATE 100 MG PO CAPS
200.0000 mg | ORAL_CAPSULE | Freq: Once | ORAL | Status: AC
  Administered 2015-08-21: 200 mg via ORAL
  Filled 2015-08-21: qty 2

## 2015-08-21 MED ORDER — BENZONATATE 100 MG PO CAPS: 100.0000 mg | ORAL_CAPSULE | Freq: Three times a day (TID) | ORAL | Status: DC

## 2015-08-21 MED ORDER — IBUPROFEN 600 MG PO TABS: 600.0000 mg | ORAL_TABLET | Freq: Four times a day (QID) | ORAL | Status: DC | PRN

## 2015-08-21 NOTE — ED Provider Notes (Signed)
CSN: [id number]     Arrival date & time 08/21/15  1256 History   First MD Initiated Contact with Patient 08/21/15 1722     Chief Complaint  Patient presents with  . Sore Throat  . Cough     (Consider location/radiation/quality/duration/timing/severity/associated sxs/prior Treatment) HPI 55 y.o. male smoker presents to the ED noting a 2-3 month history of cough worsening over the last 2-3 days with intermittent productivity of white sputum accompanied by sore throat with coughing and swallowing. He denies any rhinorrhea or sinus congestion, headache or nausea, vomiting, hemoptysis or hematemesis, diarrhea, abdominal pain. He denies any pain in his chest either with or without coughing. He denies any known sick contacts, nor recent travel. He takes no medications and tried one dose of Tylenol to only limited effect to his pain.  Additionally the patient notes a small area of swelling over his lateral left foot in the plantar region and a smaller one beneath his fifth toe of his left foot which is been present for several months associated with mild tenderness with direct palpation. He is able to ambulate normally and states that he walks a lot. Denies any history of significant trauma to his foot nor prior surgeries. He denies any swelling to his right foot, legs, or abdominal distention.  No hx of CHF, DVT/PE.   Past Medical History  Diagnosis Date  . Hypertension   . Diabetes mellitus without complication ([place])   . Shoulder pain, left 06/28/2013   Past Surgical History  Procedure Laterality Date  . Appendectomy    . Left heel surgery    . Colonoscopy  01/23/2012    Procedure: COLONOSCOPY;  Surgeon: Danie Binder, MD;  [facility];  Service: Endoscopy;  Laterality: N/A;  11:10 AM   Family History  Problem Relation Age of Onset  . Colon cancer Neg Hx    Social History  Substance Use Topics  . Smoking status: Current Every Day Smoker -- 1.00 packs/day for 30 years    Types:  Cigarettes  . Smokeless tobacco: Never Used  . Alcohol Use: 7.2 oz/week    12 Cans of beer per week    Review of Systems  Constitutional: Positive for activity change. Negative for fever, chills and appetite change.  HENT: Positive for sore throat and trouble swallowing. Negative for congestion, ear discharge, ear pain, rhinorrhea, sinus pressure, sneezing and voice change.   Respiratory: Positive for cough. Negative for chest tightness, shortness of breath and wheezing.   Cardiovascular: Negative for chest pain, palpitations and leg swelling.  Gastrointestinal: Negative for nausea, vomiting, abdominal pain and diarrhea.  Genitourinary: Negative for dysuria and difficulty urinating.  Musculoskeletal: Negative for myalgias, back pain and neck pain.  Skin: Negative for rash and wound.  Neurological: Negative for syncope, weakness, light-headedness, numbness and headaches.  All other systems reviewed and are negative.     Allergies  Review of patient's allergies indicates no known allergies.  Home Medications   Prior to Admission medications   Medication Sig Start Date End Date Taking? Authorizing Provider  benzonatate (TESSALON) 100 MG capsule Take 1 capsule (100 mg total) by mouth every 8 (eight) hours. 08/21/15   Zenovia Jarred, DO  cloNIDine (CATAPRES) 0.2 MG tablet Take 0.5 tablets (0.1 mg total) by mouth 2 (two) times daily. 06/28/13   Tanna Furry, MD  ibuprofen (ADVIL,MOTRIN) 600 MG tablet Take 1 tablet (600 mg total) by mouth every 6 (six) hours as needed. 08/21/15   Janeann Merl  Asti Mackley, DO   BP 166/77 mmHg  Pulse 77  Temp(Src) 98.1 F (36.7 C) (Oral)  Resp 16  SpO2 98% Physical Exam  Constitutional: He is oriented to person, place, and time. He appears well-developed and well-nourished. No distress.  HENT:  Head: Normocephalic and atraumatic.  Right Ear: External ear normal.  Left Ear: External ear normal.  Nose: Nose normal.  Mouth/Throat: No oropharyngeal exudate.  Mildly  erythematous posterior oropharynx with no tonsillar exudate. No PTA or RPA visualized.   Eyes: Conjunctivae and EOM are normal. Pupils are equal, round, and reactive to light.  Neck: Normal range of motion. Neck supple.  Cardiovascular: Normal rate, regular rhythm, normal heart sounds and intact distal pulses.   Pulmonary/Chest: Effort normal and breath sounds normal.  Abdominal: Soft. There is no tenderness.  Musculoskeletal: He exhibits no edema or tenderness.       Left foot: There is no tenderness, no swelling, no crepitus, no deformity and no laceration.       Feet:  Lymphadenopathy:    He has cervical adenopathy.  Neurological: He is alert and oriented to person, place, and time. No cranial nerve deficit. Coordination normal.  Skin: Skin is warm and dry. No rash noted. He is not diaphoretic.  Nursing note and vitals reviewed.   ED Course  Procedures (including critical care time) Labs Review Labs Reviewed  RAPID STREP SCREEN (NOT AT [facility])  CULTURE, GROUP A STREP [facility])    Imaging Review Dg Chest 2 View  08/21/2015  CLINICAL DATA:  Productive cough for 2 months with intermittent fever. Smoker. EXAM: CHEST  2 VIEW COMPARISON:  05/17/2010 FINDINGS: The cardiomediastinal silhouette is within normal limits. Lung volumes are unchanged. Airway thickening has slightly increased compared to the prior study. No confluent airspace opacity, edema, pleural effusion, or pneumothorax is identified. Thoracic spondylosis is noted. IMPRESSION: Increased bronchitic changes. Electronically Signed   By: Logan Bores M.D.   On: 08/21/2015 13:42   I have personally reviewed and evaluated these images and lab results as part of my medical decision-making.   EKG Interpretation   Date/Time:  Tuesday August 21 2015 19:02:24 EDT Ventricular Rate:  80 PR Interval:  164 QRS Duration: 84 QT Interval:  440 QTC Calculation: 508 R Axis:   -24 Text Interpretation:  Sinus rhythm Borderline left axis  deviation Minimal  ST depression, inferior leads Prolonged QT interval Baseline wander in  lead(s) V4 No significant change was found Confirmed by Wyvonnia Dusky  MD,  STEPHEN [phone]) on 08/21/2015 7:04:28 PM Also confirmed by Wyvonnia Dusky  MD,  [place] [phone]), editor Gilford Rile, CCT, [place] ([id number])  on 08/22/2015 7:11:31  AM      MDM  55 y.o. male smoker presents to the ED noting an acute on chronic cough, worse over the last few days with accompanying sore throat. Exam as above, consistent with viral pharyngitis with bronchitis. CXR shows evidence of bronchitis. Smoking cessation advised. Rapid strep screen negative, sent for culture. Feel that this is a viral illness precipitating his sx. His foot callous are clearly chronic and likely due to ill-fitting shoes and extensive ambulation. No edema suggestive of CHF nor DVT. Recommended symptomatic care with NSAIDs for pain, tessalon for cough and recommended honey for sore throat treatment and hydration. This plan was discussed with the patient at the bedside and he stated both understanding and agreement. Return precautions were given for significant worsening or concerns.    Final diagnoses:  Acute viral pharyngitis  Cough  Smoking  Zenovia Jarred, DO 08/22/15 [place], MD 08/22/15 [phone]

## 2015-08-21 NOTE — ED Notes (Signed)
Pt here for sore throat, cough and feet swelling.

## 2015-08-21 NOTE — ED Notes (Signed)
Ambulated Pt in hallway. Sat remained at 97%.

## 2015-08-23 LAB — CULTURE, GROUP A STREP (THRC)

## 2016-06-29 ENCOUNTER — Encounter (HOSPITAL_COMMUNITY)
Admission: EM | Disposition: A | Discharge status: Home / Self Care | Payer: Self-pay | Source: Home / Self Care | Attending: Family Medicine

## 2016-06-29 ENCOUNTER — Emergency Department (HOSPITAL_COMMUNITY): Payer: Self-pay

## 2016-06-29 ENCOUNTER — Inpatient Hospital Stay (HOSPITAL_COMMUNITY)
Admission: EM | Admit: 2016-06-29 | Discharge: 2016-07-01 | DRG: 854 | Disposition: A | Discharge status: Home / Self Care | Payer: Self-pay | Attending: Family Medicine | Admitting: Family Medicine

## 2016-06-29 ENCOUNTER — Inpatient Hospital Stay (HOSPITAL_COMMUNITY): Payer: Self-pay | Admitting: Anesthesiology

## 2016-06-29 ENCOUNTER — Encounter (HOSPITAL_COMMUNITY): Payer: Self-pay | Admitting: Emergency Medicine

## 2016-06-29 ENCOUNTER — Inpatient Hospital Stay (HOSPITAL_COMMUNITY): Payer: Self-pay

## 2016-06-29 DIAGNOSIS — I10 Essential (primary) hypertension: Secondary | ICD-10-CM | POA: Diagnosis present

## 2016-06-29 DIAGNOSIS — E119 Type 2 diabetes mellitus without complications: Secondary | ICD-10-CM

## 2016-06-29 DIAGNOSIS — E872 Acidosis: Secondary | ICD-10-CM | POA: Diagnosis present

## 2016-06-29 DIAGNOSIS — L02211 Cutaneous abscess of abdominal wall: Secondary | ICD-10-CM | POA: Diagnosis present

## 2016-06-29 DIAGNOSIS — A419 Sepsis, unspecified organism: Principal | ICD-10-CM | POA: Diagnosis present

## 2016-06-29 DIAGNOSIS — L03311 Cellulitis of abdominal wall: Secondary | ICD-10-CM

## 2016-06-29 DIAGNOSIS — F17210 Nicotine dependence, cigarettes, uncomplicated: Secondary | ICD-10-CM | POA: Diagnosis present

## 2016-06-29 DIAGNOSIS — N200 Calculus of kidney: Secondary | ICD-10-CM | POA: Diagnosis present

## 2016-06-29 DIAGNOSIS — E118 Type 2 diabetes mellitus with unspecified complications: Secondary | ICD-10-CM | POA: Diagnosis present

## 2016-06-29 DIAGNOSIS — N189 Chronic kidney disease, unspecified: Secondary | ICD-10-CM | POA: Diagnosis present

## 2016-06-29 DIAGNOSIS — F1010 Alcohol abuse, uncomplicated: Secondary | ICD-10-CM | POA: Diagnosis present

## 2016-06-29 DIAGNOSIS — Z9049 Acquired absence of other specified parts of digestive tract: Secondary | ICD-10-CM

## 2016-06-29 DIAGNOSIS — R740 Nonspecific elevation of levels of transaminase and lactic acid dehydrogenase [LDH]: Secondary | ICD-10-CM | POA: Diagnosis present

## 2016-06-29 HISTORY — PX: INCISION AND DRAINAGE ABSCESS: SHX5864

## 2016-06-29 LAB — LACTIC ACID, PLASMA
LACTIC ACID, VENOUS: 3.5 mmol/L — AB (ref 0.5–1.9)
Lactic Acid, Venous: 1.7 mmol/L (ref 0.5–1.9)
Lactic Acid, Venous: 0.9 mmol/L (ref 0.5–1.9)
Lactic Acid, Venous: 0.8 mmol/L (ref 0.5–1.9)

## 2016-06-29 LAB — CBC WITH DIFFERENTIAL/PLATELET
BASOS PCT: 0 %
Basophils Absolute: 0 10*3/uL (ref 0.0–0.1)
EOS ABS: 0 10*3/uL (ref 0.0–0.7)
EOS PCT: 0 %
HEMATOCRIT: 40.9 % (ref 39.0–52.0)
Hemoglobin: 14.3 g/dL (ref 13.0–17.0)
Lymphocytes Relative: 32 %
Lymphs Abs: 3.4 10*3/uL (ref 0.7–4.0)
MCH: 35.5 pg — ABNORMAL HIGH (ref 26.0–34.0)
MCHC: 35 g/dL (ref 30.0–36.0)
MCV: 101.5 fL — ABNORMAL HIGH (ref 78.0–100.0)
MONO ABS: 0.9 10*3/uL (ref 0.1–1.0)
MONOS PCT: 8 %
Neutro Abs: 6.2 10*3/uL (ref 1.7–7.7)
Neutrophils Relative %: 60 %
PLATELETS: 124 10*3/uL — AB (ref 150–400)
RBC: 4.03 MIL/uL — ABNORMAL LOW (ref 4.22–5.81)
RDW: 13.2 % (ref 11.5–15.5)
WBC: 10.5 10*3/uL (ref 4.0–10.5)

## 2016-06-29 LAB — APTT: aPTT: 30 seconds (ref 24–36)

## 2016-06-29 LAB — COMPREHENSIVE METABOLIC PANEL
ALBUMIN: 3.4 g/dL — AB (ref 3.5–5.0)
ALT: 49 U/L (ref 17–63)
ALT: 51 U/L (ref 17–63)
ANION GAP: 14 (ref 5–15)
AST: 111 U/L — ABNORMAL HIGH (ref 15–41)
AST: 106 U/L — ABNORMAL HIGH (ref 15–41)
Albumin: 3.4 g/dL — ABNORMAL LOW (ref 3.5–5.0)
Alkaline Phosphatase: 54 U/L (ref 38–126)
Alkaline Phosphatase: 58 U/L (ref 38–126)
Anion gap: 8 (ref 5–15)
BILIRUBIN TOTAL: 1 mg/dL (ref 0.3–1.2)
BUN: 16 mg/dL (ref 6–20)
BUN: 14 mg/dL (ref 6–20)
CHLORIDE: 106 mmol/L (ref 101–111)
CHLORIDE: 110 mmol/L (ref 101–111)
CO2: 18 mmol/L — ABNORMAL LOW (ref 22–32)
CO2: 22 mmol/L (ref 22–32)
Calcium: 9 mg/dL (ref 8.9–10.3)
Calcium: 9 mg/dL (ref 8.9–10.3)
Creatinine, Ser: 1.24 mg/dL (ref 0.61–1.24)
Creatinine, Ser: 1.13 mg/dL (ref 0.61–1.24)
GFR calc Af Amer: >60 mL/min (ref >60)
GFR calc non Af Amer: >60 mL/min (ref >60)
GFR calc non Af Amer: >60 mL/min (ref >60)
GLUCOSE: 109 mg/dL — AB (ref 65–99)
Glucose, Bld: 133 mg/dL — ABNORMAL HIGH (ref 65–99)
POTASSIUM: 3.9 mmol/L (ref 3.5–5.1)
POTASSIUM: 4 mmol/L (ref 3.5–5.1)
SODIUM: 138 mmol/L (ref 135–145)
Sodium: 140 mmol/L (ref 135–145)
TOTAL PROTEIN: 8.1 g/dL (ref 6.5–8.1)
Total Bilirubin: 0.8 mg/dL (ref 0.3–1.2)
Total Protein: 8 g/dL (ref 6.5–8.1)

## 2016-06-29 LAB — URINALYSIS, ROUTINE W REFLEX MICROSCOPIC
Bilirubin Urine: NEGATIVE
Glucose, UA: NEGATIVE mg/dL
KETONES UR: NEGATIVE mg/dL
LEUKOCYTES UA: NEGATIVE
Nitrite: NEGATIVE
PROTEIN: 30 mg/dL — AB
SQUAMOUS EPITHELIAL / LPF: NONE SEEN
Specific Gravity, Urine: 1.008 (ref 1.005–1.030)
pH: 5 (ref 5.0–8.0)

## 2016-06-29 LAB — GLUCOSE, CAPILLARY
GLUCOSE-CAPILLARY: 105 mg/dL — AB (ref 65–99)
GLUCOSE-CAPILLARY: 98 mg/dL (ref 65–99)
Glucose-Capillary: 110 mg/dL — ABNORMAL HIGH (ref 65–99)
Glucose-Capillary: 117 mg/dL — ABNORMAL HIGH (ref 65–99)

## 2016-06-29 LAB — TROPONIN I
Troponin I: <0.03 ng/mL (ref <0.03)
Troponin I: <0.03 ng/mL (ref <0.03)

## 2016-06-29 LAB — CBC
HEMATOCRIT: 39.2 % (ref 39.0–52.0)
HEMOGLOBIN: 13.7 g/dL (ref 13.0–17.0)
MCH: 34.9 pg — AB (ref 26.0–34.0)
MCHC: 34.9 g/dL (ref 30.0–36.0)
MCV: 99.7 fL (ref 78.0–100.0)
Platelets: 118 10*3/uL — ABNORMAL LOW (ref 150–400)
RBC: 3.93 MIL/uL — AB (ref 4.22–5.81)
RDW: 13 % (ref 11.5–15.5)
WBC: 13.3 10*3/uL — ABNORMAL HIGH (ref 4.0–10.5)

## 2016-06-29 LAB — PROTIME-INR
INR: 0.94
PROTHROMBIN TIME: 12.6 s (ref 11.4–15.2)

## 2016-06-29 LAB — MRSA PCR SCREENING: MRSA BY PCR: NEGATIVE

## 2016-06-29 LAB — TSH: TSH: 2.833 u[IU]/mL (ref 0.350–4.500)

## 2016-06-29 LAB — PROCALCITONIN

## 2016-06-29 LAB — LIPASE, BLOOD: LIPASE: 45 U/L (ref 11–51)

## 2016-06-29 SURGERY — INCISION AND DRAINAGE, ABSCESS
Anesthesia: General | Site: Abdomen

## 2016-06-29 MED ORDER — ONDANSETRON HCL 4 MG/2ML IJ SOLN: 4.0000 mg | Freq: Four times a day (QID) | INTRAMUSCULAR | Status: DC | PRN

## 2016-06-29 MED ORDER — VANCOMYCIN HCL IN DEXTROSE 750-5 MG/150ML-% IV SOLN
750.0000 mg | Freq: Two times a day (BID) | INTRAVENOUS | Status: DC
  Administered 2016-06-29 – 2016-06-30 (×2): 750 mg via INTRAVENOUS
  Filled 2016-06-29 (×4): qty 150

## 2016-06-29 MED ORDER — PROPOFOL 10 MG/ML IV BOLUS
INTRAVENOUS | Status: AC
  Filled 2016-06-29: qty 20

## 2016-06-29 MED ORDER — ADULT MULTIVITAMIN W/MINERALS CH
1.0000 | ORAL_TABLET | Freq: Every day | ORAL | Status: DC
  Administered 2016-06-30 – 2016-07-01 (×2): 1 via ORAL
  Filled 2016-06-29 (×3): qty 1

## 2016-06-29 MED ORDER — ONDANSETRON HCL 4 MG/2ML IJ SOLN
4.0000 mg | INTRAMUSCULAR | Status: AC
  Administered 2016-06-29: 4 mg via INTRAVENOUS
  Filled 2016-06-29: qty 2

## 2016-06-29 MED ORDER — FENTANYL CITRATE (PF) 100 MCG/2ML IJ SOLN
INTRAMUSCULAR | Status: AC
  Filled 2016-06-29: qty 2

## 2016-06-29 MED ORDER — PROMETHAZINE HCL 25 MG/ML IJ SOLN: 6.2500 mg | INTRAMUSCULAR | Status: DC | PRN

## 2016-06-29 MED ORDER — PROPOFOL 10 MG/ML IV BOLUS
INTRAVENOUS | Status: DC | PRN
  Administered 2016-06-29: 200 mg via INTRAVENOUS

## 2016-06-29 MED ORDER — MIDAZOLAM HCL 5 MG/5ML IJ SOLN
INTRAMUSCULAR | Status: DC | PRN
  Administered 2016-06-29: 2 mg via INTRAVENOUS

## 2016-06-29 MED ORDER — VANCOMYCIN HCL IN DEXTROSE 1-5 GM/200ML-% IV SOLN
1000.0000 mg | Freq: Once | INTRAVENOUS | Status: AC
  Administered 2016-06-29: 1000 mg via INTRAVENOUS
  Filled 2016-06-29: qty 200

## 2016-06-29 MED ORDER — 0.9 % SODIUM CHLORIDE (POUR BTL) OPTIME
TOPICAL | Status: DC | PRN
  Administered 2016-06-29: 1000 mL

## 2016-06-29 MED ORDER — NICOTINE 21 MG/24HR TD PT24
21.0000 mg | MEDICATED_PATCH | Freq: Every day | TRANSDERMAL | Status: DC
  Administered 2016-06-29 – 2016-07-01 (×3): 21 mg via TRANSDERMAL
  Filled 2016-06-29 (×4): qty 1

## 2016-06-29 MED ORDER — CLONIDINE HCL 0.1 MG PO TABS
0.1000 mg | ORAL_TABLET | Freq: Two times a day (BID) | ORAL | Status: DC
  Administered 2016-06-29 – 2016-07-01 (×5): 0.1 mg via ORAL
  Filled 2016-06-29 (×5): qty 1

## 2016-06-29 MED ORDER — PIPERACILLIN-TAZOBACTAM 3.375 G IVPB
3.3750 g | Freq: Three times a day (TID) | INTRAVENOUS | Status: DC
  Administered 2016-06-29 (×2): 3.375 g via INTRAVENOUS
  Filled 2016-06-29 (×4): qty 50

## 2016-06-29 MED ORDER — PIPERACILLIN-TAZOBACTAM 3.375 G IVPB 30 MIN
3.3750 g | Freq: Once | INTRAVENOUS | Status: AC
  Administered 2016-06-29: 3.375 g via INTRAVENOUS
  Filled 2016-06-29: qty 50

## 2016-06-29 MED ORDER — IOPAMIDOL (ISOVUE-300) INJECTION 61%
INTRAVENOUS | Status: AC
  Administered 2016-06-29: 100 mL via INTRAVENOUS
  Filled 2016-06-29: qty 100

## 2016-06-29 MED ORDER — FOLIC ACID 1 MG PO TABS
1.0000 mg | ORAL_TABLET | Freq: Every day | ORAL | Status: DC
  Administered 2016-06-30 – 2016-07-01 (×2): 1 mg via ORAL
  Filled 2016-06-29 (×3): qty 1

## 2016-06-29 MED ORDER — OXYCODONE HCL 5 MG PO TABS
5.0000 mg | ORAL_TABLET | ORAL | Status: DC | PRN
  Administered 2016-06-29 – 2016-06-30 (×3): 5 mg via ORAL
  Filled 2016-06-29 (×3): qty 1

## 2016-06-29 MED ORDER — LACTATED RINGERS IV SOLN
INTRAVENOUS | Status: DC | PRN
  Administered 2016-06-29: 14:00:00 via INTRAVENOUS

## 2016-06-29 MED ORDER — HYDROMORPHONE HCL 1 MG/ML IJ SOLN
0.2500 mg | INTRAMUSCULAR | Status: DC | PRN
  Administered 2016-06-29 (×2): 0.5 mg via INTRAVENOUS

## 2016-06-29 MED ORDER — SODIUM CHLORIDE 0.9 % IV SOLN
INTRAVENOUS | Status: DC
  Administered 2016-06-29 – 2016-06-30 (×4): via INTRAVENOUS

## 2016-06-29 MED ORDER — FENTANYL CITRATE (PF) 100 MCG/2ML IJ SOLN
INTRAMUSCULAR | Status: DC | PRN
  Administered 2016-06-29 (×2): 50 ug via INTRAVENOUS

## 2016-06-29 MED ORDER — INSULIN ASPART 100 UNIT/ML ~~LOC~~ SOLN
0.0000 [IU] | Freq: Three times a day (TID) | SUBCUTANEOUS | Status: DC
  Administered 2016-06-30: 3 [IU] via SUBCUTANEOUS

## 2016-06-29 MED ORDER — HYDROMORPHONE HCL 1 MG/ML IJ SOLN
INTRAMUSCULAR | Status: AC
  Filled 2016-06-29: qty 1

## 2016-06-29 MED ORDER — LORAZEPAM 2 MG/ML IJ SOLN: 1.0000 mg | Freq: Four times a day (QID) | INTRAMUSCULAR | Status: DC | PRN

## 2016-06-29 MED ORDER — VITAMIN B-1 100 MG PO TABS
100.0000 mg | ORAL_TABLET | Freq: Every day | ORAL | Status: DC
  Administered 2016-06-30 – 2016-07-01 (×2): 100 mg via ORAL
  Filled 2016-06-29 (×3): qty 1

## 2016-06-29 MED ORDER — IOPAMIDOL (ISOVUE-300) INJECTION 61%
100.0000 mL | Freq: Once | INTRAVENOUS | Status: AC | PRN
  Administered 2016-06-29: 100 mL via INTRAVENOUS

## 2016-06-29 MED ORDER — KETOROLAC TROMETHAMINE 30 MG/ML IJ SOLN
30.0000 mg | Freq: Four times a day (QID) | INTRAMUSCULAR | Status: DC | PRN
Start: 2016-06-29 — End: 2016-06-30
  Administered 2016-06-29: 30 mg via INTRAVENOUS
  Filled 2016-06-29: qty 1

## 2016-06-29 MED ORDER — ONDANSETRON HCL 4 MG PO TABS: 4.0000 mg | ORAL_TABLET | Freq: Four times a day (QID) | ORAL | Status: DC | PRN

## 2016-06-29 MED ORDER — DEXTROSE 5 % IV SOLN
3.3750 g | Freq: Three times a day (TID) | INTRAVENOUS | Status: DC
  Administered 2016-06-30 (×2): 3.375 g via INTRAVENOUS
  Filled 2016-06-29 (×3): qty 3.38

## 2016-06-29 MED ORDER — BISACODYL 5 MG PO TBEC: 5.0000 mg | DELAYED_RELEASE_TABLET | Freq: Every day | ORAL | Status: DC | PRN

## 2016-06-29 MED ORDER — THIAMINE HCL 100 MG/ML IJ SOLN: 100.0000 mg | Freq: Every day | INTRAMUSCULAR | Status: DC

## 2016-06-29 MED ORDER — MORPHINE SULFATE (PF) 4 MG/ML IV SOLN
4.0000 mg | Freq: Once | INTRAVENOUS | Status: AC
  Administered 2016-06-29: 4 mg via INTRAVENOUS
  Filled 2016-06-29: qty 1

## 2016-06-29 MED ORDER — VANCOMYCIN HCL IN DEXTROSE 1-5 GM/200ML-% IV SOLN: 1000.0000 mg | Freq: Two times a day (BID) | INTRAVENOUS | Status: DC

## 2016-06-29 MED ORDER — LIDOCAINE 2% (20 MG/ML) 5 ML SYRINGE
INTRAMUSCULAR | Status: DC | PRN
  Administered 2016-06-29: 60 mg via INTRAVENOUS

## 2016-06-29 MED ORDER — ENOXAPARIN SODIUM 40 MG/0.4ML ~~LOC~~ SOLN
40.0000 mg | SUBCUTANEOUS | Status: DC
  Administered 2016-06-29 – 2016-06-30 (×2): 40 mg via SUBCUTANEOUS
  Filled 2016-06-29 (×2): qty 0.4

## 2016-06-29 MED ORDER — MORPHINE SULFATE (PF) 4 MG/ML IV SOLN
1.0000 mg | INTRAVENOUS | Status: DC | PRN
  Administered 2016-06-30: 3 mg via INTRAVENOUS
  Filled 2016-06-29: qty 1

## 2016-06-29 MED ORDER — LORAZEPAM 1 MG PO TABS
1.0000 mg | ORAL_TABLET | Freq: Four times a day (QID) | ORAL | Status: DC | PRN
Start: 2016-06-29 — End: 2016-07-01

## 2016-06-29 MED ORDER — MIDAZOLAM HCL 2 MG/2ML IJ SOLN
INTRAMUSCULAR | Status: AC
Start: 2016-06-29 — End: 2016-06-29
  Filled 2016-06-29: qty 2

## 2016-06-29 SURGICAL SUPPLY — 31 items
BLADE SURG 15 STRL LF DISP TIS (BLADE) ×1 IMPLANT
BLADE SURG 15 STRL SS (BLADE) ×2
BNDG GAUZE ELAST 4 BULKY (GAUZE/BANDAGES/DRESSINGS) ×2 IMPLANT
COVER SURGICAL LIGHT HANDLE (MISCELLANEOUS) IMPLANT
DECANTER SPIKE VIAL GLASS SM (MISCELLANEOUS) IMPLANT
DRAPE LAPAROSCOPIC ABDOMINAL (DRAPES) ×2 IMPLANT
DRSG PAD ABDOMINAL 8X10 ST (GAUZE/BANDAGES/DRESSINGS) IMPLANT
ELECT PENCIL ROCKER SW 15FT (MISCELLANEOUS) ×2 IMPLANT
ELECT REM PT RETURN 9FT ADLT (ELECTROSURGICAL) ×2
ELECTRODE REM PT RTRN 9FT ADLT (ELECTROSURGICAL) ×1 IMPLANT
GAUZE SPONGE 4X4 12PLY STRL (GAUZE/BANDAGES/DRESSINGS) ×2 IMPLANT
GLOVE BIO SURGEON STRL SZ7.5 (GLOVE) ×2 IMPLANT
GOWN SPEC L4 XLG W/TWL (GOWN DISPOSABLE) ×2 IMPLANT
GOWN STRL REUS W/ TWL XL LVL3 (GOWN DISPOSABLE) ×3 IMPLANT
GOWN STRL REUS W/TWL XL LVL3 (GOWN DISPOSABLE) ×6
KIT BASIN OR (CUSTOM PROCEDURE TRAY) ×2 IMPLANT
NEEDLE HYPO 25X1 1.5 SAFETY (NEEDLE) IMPLANT
PACK BASIC VI WITH GOWN DISP (CUSTOM PROCEDURE TRAY) ×2 IMPLANT
PAD ABD 8X10 STRL (GAUZE/BANDAGES/DRESSINGS) ×2 IMPLANT
SPONGE LAP 18X18 X RAY DECT (DISPOSABLE) ×2 IMPLANT
SUT MNCRL AB 4-0 PS2 18 (SUTURE) IMPLANT
SUT VIC AB 3-0 SH 27 (SUTURE)
SUT VIC AB 3-0 SH 27XBRD (SUTURE) IMPLANT
SWAB COLLECTION DEVICE MRSA (MISCELLANEOUS) ×2 IMPLANT
SWAB CULTURE ESWAB REG 1ML (MISCELLANEOUS) ×2 IMPLANT
SYR 20CC LL (SYRINGE) IMPLANT
SYR BULB IRRIGATION 50ML (SYRINGE) ×2 IMPLANT
TAPE CLOTH SURG 4X10 WHT LF (GAUZE/BANDAGES/DRESSINGS) ×2 IMPLANT
TOWEL OR 17X26 10 PK STRL BLUE (TOWEL DISPOSABLE) ×2 IMPLANT
TOWEL OR NON WOVEN STRL DISP B (DISPOSABLE) ×2 IMPLANT
YANKAUER SUCT BULB TIP 10FT TU (MISCELLANEOUS) ×2 IMPLANT

## 2016-06-29 NOTE — Progress Notes (Addendum)
Pharmacy Antibiotic Note  Daniel [name] is a 56 y.o. male admitted on 06/29/2016 with sepsis d/t abdominal wall cellulitis and abscess.  Pharmacy has been consulted for Vancomycin and Zosyn dosing.  Plan:  Reduce vancomycin to 750 mg IV q12 per obesity nomogram  Continue Zosyn extended infusion as ordered  For I&D today  Daily SCr given vanc/Zosyn combo and obesity dosing  Height: 5\' 11"  (180.3 cm) Weight: 260 lb 4.8 oz (118.1 kg) IBW/kg (Calculated) : 75.3  Temp (24hrs), Avg:98.7 F (37.1 C), Min:98.4 F (36.9 C), Max:99 F (37.2 C)   Recent Labs Lab 06/29/16 0152 06/29/16 0247 06/29/16 0636  WBC 13.3*  --   --   CREATININE 1.24  --   --   LATICACIDVEN  --  3.5* 1.7    Estimated Creatinine Clearance: 86.9 mL/min (by C-G formula based on SCr of 1.24 mg/dL).    No Known Allergies  Antimicrobials this admission: Vancomycin 3/4 >> Zosyn 3/4 >>    Dose adjustments this admission: -   Microbiology results: 3/4 BCx: sent  Thank you for allowing pharmacy to be a part of this patient's care.  Ndia Sampath A 06/29/2016 11:21 AM

## 2016-06-29 NOTE — ED Notes (Signed)
Bed: PP06 Expected date:  Expected time:  Means of arrival:  Comments: 83m abdominal pain

## 2016-06-29 NOTE — H&P (Signed)
History and Physical    FREDRICO BEEDLE YTK:[id number] DOB: [birth year] DOA: 06/29/2016  Referring MD/NP/PA: EDP PCP: Rosita Fire, MD Outpatient Specialists:  Patient coming from: Home  Chief Complaint: Lower abdominal pain  HPI: HADRIEL NORTHUP is a 56 y.o. male with medical history significant of hypertension and probable diabetes mellitus presents with 4 day history of abdominal pain which have been progressively worsening with chills without fever. No nausea vomiting. No diarrhea, no recent sick contacts. 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  He is not very sure whether he has diabetes or not but his records indicate history of diabetes  ED Course: Elevation in the ED patient had frequent was more than 90, he was noted to have abdominal redness and tenderness consistent with cellulitis. Lab work indicated leukocytosis with elevated lactic acid level, otherwise hemodynamically stable.  CT abdomen noted possible abscess versus phlegmon, with an incidental finding of aortic atherosclerosis and nephrolithiasis.  Gen. surgical consult was called and antibiotics initiated - hospitalist asked to evaluate for admission after talking to general surgery on account of associated abscess  Review of Systems: As per HPI otherwise 10 point review of systems negative.  Past Medical History:  Diagnosis Date  . Diabetes mellitus without complication ([place])   . Hypertension   . Shoulder pain, left 06/28/2013    Past Surgical History:  Procedure Laterality Date  . APPENDECTOMY    . COLONOSCOPY  01/23/2012   Procedure: COLONOSCOPY;  Surgeon: Danie Binder, MD;  Location[facility];  Service: Endoscopy;  Laterality: N/A;  11:10 AM  . Left heel surgery       reports that he has been smoking Cigarettes.  He has a 30.00 pack-year smoking history. He has never used smokeless  tobacco. He reports that he drinks about 7.2 oz of alcohol per week . He reports that he uses drugs, including "Crack" cocaine.  No Known Allergies  Family History  Problem Relation Age of Onset  . Colon cancer Neg Hx      Prior to Admission medications   Medication Sig Start Date End Date Taking? Authorizing Provider  benzonatate (TESSALON) 100 MG capsule Take 1 capsule (100 mg total) by mouth every 8 (eight) hours. Patient not taking: Reported on 06/29/2016 08/21/15   Zenovia Jarred, DO  cloNIDine (CATAPRES) 0.2 MG tablet Take 0.5 tablets (0.1 mg total) by mouth 2 (two) times daily. Patient not taking: Reported on 06/29/2016 06/28/13   Tanna Furry, MD  ibuprofen (ADVIL,MOTRIN) 600 MG tablet Take 1 tablet (600 mg total) by mouth every 6 (six) hours as needed. Patient not taking: Reported on 06/29/2016 08/21/15   Zenovia Jarred, DO    Physical Exam: Vitals:   06/29/16 0500 06/29/16 0630 06/29/16 0633 06/29/16 0800  BP: 156/81 152/93 155/88 167/98  Pulse: 93 85 81 83  Resp: _0 Temp:   99 F (37.2 C)   TempSrc:   Oral   SpO2: 97% 98% 98% 97%  Weight:      Height:          Constitutional: NAD, calm, comfortable Vitals:   06/29/16 0500 06/29/16 0630 06/29/16 0633 06/29/16 0800  BP: 156/81 152/93 155/88 167/98  Pulse: 93 85 81 83  Resp: _1 Temp:   99 F (37.2 C)   TempSrc:  Oral   SpO2: 97% 98% 98% 97%  Weight:      Height:       Eyes: PERRL, lids and conjunctivae normal ENMT: Mucous membranes are moist. Posterior pharynx clear of any exudate or lesions.Normal dentition.  Neck: normal, supple, no masses, no thyromegaly Respiratory: clear to auscultation bilaterally, no wheezing, no crackles. Normal respiratory effort. No accessory muscle use.  Cardiovascular: Regular rate and rhythm, no murmurs / rubs / gallops. No extremity edema. 2+ pedal pulses. No carotid bruits.  Abdomen: no tenderness, no masses palpated. No hepatosplenomegaly. Bowel sounds positive.    Musculoskeletal: no clubbing / cyanosis. No joint deformity upper and lower extremities. Good ROM, no contractures. Normal muscle tone.  Skin: Large intra-abdominal erythema, warm to touch and tender to palpation, about 10 cm x 20 cm, generally no fluctuancy,  however there is a focal area of area of about 3-4 cm which is fluctuant.  Neurologic: CN 2-12 grossly intact. Sensation intact, DTR normal. Strength 5/5 in all 4.  Psychiatric: Normal judgment and insight. Alert and oriented x 3. Normal mood.    Labs on Admission: I have personally reviewed following labs and imaging studies  CBC:  Recent Labs Lab 06/29/16 0152  WBC 13.3*  HGB 13.7  HCT 39.2  MCV 99.7  PLT 338*   Basic Metabolic Panel:  Recent Labs Lab 06/29/16 0152  NA 138  K 3.9  CL 106  CO2 18*  GLUCOSE 133*  BUN 16  CREATININE 1.24  CALCIUM 9.0   GFR: Estimated Creatinine Clearance: 85.1 mL/min (by C-G formula based on SCr of 1.24 mg/dL). Liver Function Tests:  Recent Labs Lab 06/29/16 0152  AST 111*  ALT 49  ALKPHOS 54  BILITOT 0.8  PROT 8.0  ALBUMIN 3.4*    Recent Labs Lab 06/29/16 0152  LIPASE 45   No results for input(s): AMMONIA in the last 168 hours. Coagulation Profile: No results for input(s): INR, PROTIME in the last 168 hours. Cardiac Enzymes: No results for input(s): CKTOTAL, CKMB, CKMBINDEX, TROPONINI in the last 168 hours. BNP (last 3 results) No results for input(s): PROBNP in the last 8760 hours. HbA1C: No results for input(s): HGBA1C in the last 72 hours. CBG: No results for input(s): GLUCAP in the last 168 hours. Lipid Profile: No results for input(s): CHOL, HDL, LDLCALC, TRIG, CHOLHDL, LDLDIRECT in the last 72 hours. Thyroid Function Tests: No results for input(s): TSH, T4TOTAL, FREET4, T3FREE, THYROIDAB in the last 72 hours. Anemia Panel: No results for input(s): VITAMINB12, FOLATE, FERRITIN, TIBC, IRON, RETICCTPCT in the last 72 hours. Urine analysis:     Component Value Date/Time   COLORURINE YELLOW 06/29/2016 0153   APPEARANCEUR CLEAR 06/29/2016 0153   LABSPEC 1.008 06/29/2016 0153   PHURINE 5.0 06/29/2016 0153   GLUCOSEU NEGATIVE 06/29/2016 0153   HGBUR SMALL (A) 06/29/2016 0153   BILIRUBINUR NEGATIVE 06/29/2016 0153   KETONESUR NEGATIVE 06/29/2016 0153   PROTEINUR 30 (A) 06/29/2016 0153   UROBILINOGEN 0.2 12/31/2009 0746   NITRITE NEGATIVE 06/29/2016 0153   LEUKOCYTESUR NEGATIVE 06/29/2016 0153   Sepsis Labs: _0 (procalcitonin:4,lacticidven:4) )No results found for this or any previous visit (from the past 240 hour(s)).   Radiological Exams on Admission: Ct Abdomen Pelvis W Contrast  Result Date: 06/29/2016 CLINICAL DATA:  Abdominal pain. Evaluate for abdominal wall abscess. EXAM: CT ABDOMEN AND PELVIS WITH CONTRAST TECHNIQUE: Multidetector CT imaging of the abdomen and pelvis was performed using the standard protocol following bolus administration of intravenous contrast. CONTRAST:  147m ISOVUE-300  IOPAMIDOL (ISOVUE-300) INJECTION 61% COMPARISON:  None. FINDINGS: Lower chest: No pulmonary nodules. No visible pleural or pericardial effusion. Hepatobiliary: Normal hepatic size and contours without focal liver lesion. No perihepatic ascites. No intra- or extrahepatic biliary dilatation. There is a calcified granuloma in the left hepatic lobe. Normal gallbladder. Pancreas: Normal pancreatic contours and enhancement. No peripancreatic fluid collection or pancreatic ductal dilatation. Spleen: Normal. Adrenals/Urinary Tract: Normal adrenal glands. There is a nonobstructing left renal calculus at the lower pole measuring 3 mm. The right kidney is normal. Stomach/Bowel: No abnormal bowel dilatation. No bowel wall thickening or adjacent fat stranding to indicate acute inflammation. No abdominal fluid collection. The appendix is absent. Vascular/Lymphatic: There is atherosclerotic calcification of the non aneurysmal abdominal aorta. No  abdominal or pelvic adenopathy. Reproductive: The prostate is normal. Musculoskeletal: There is no bony spinal canal stenosis. No lytic or blastic lesions. There is lower lumbar osteophytosis. Other: Within the subcutaneous fat of the paracentral left lower quadrant, there is a intermediate attenuation collection measuring 4.7 x 3.0 x 2.8 cm with surrounding inflammatory stranding. IMPRESSION: 1. Intermediate attenuation collection within the subcutaneous fat of the left lower quadrant abdomen. The appearances is consistent with phlegmon or early abscess formation. 2. Nonobstructing left nephrolithiasis with 3 mm stone at the lower pole. 3. Aortic atherosclerosis. Electronically Signed   By: Ulyses Jarred M.D.   On: 06/29/2016 04:25    EKG: ordered  Assessment/Plan Principal Problem:   Cellulitis of abdominal wall Active Problems:   Abscess of abdominal wall   Hypertension   DM2 (diabetes mellitus, type 2) ([place])   Sepsis ([place])  #1 Sepsis: Source- abd wall cellulitis and abcsess Hemodynamically stable IV Antibiotic Watch Hemodynamics F/u  Leukocytosis, Lactate Supportive Care  #2 Metabolic Acidosis: Mild.  Anion gap met Acidosis Hx of DM2 -but glucose so far  Less than 140 mg/dl No clinical DKA Suspect due to Sepsis IVF and tx of primary etiology  #3 Lower Abdominal Wall Cellulitis and Abscess: Abx pain control NPO for now - for I& D per surgery today  #4 DM 2: Diet Controlled. Proteinuria(+) on UA CBG checks with SSI as needed A1C ordered - if DM confirmed and Met Acidosis resolved may start Metformin  #5 Hypertension: On Clonidine Consider switch to ACEI/ARB before discharge- (+) proteinuria      #6 Thrombocytopenia with Transaminitis (AST): Likely alcohol-related Follow clinically CIWA ( prventative) ordered  #7 Aortic Atherosclerosis: Secondary preventative measures Check lipid panel- target LDL <  70, statin prn - can be followed as out-patient  #8 3 mm Left  Nephrolithiasis: Nonobstructing Outpatient follow-up No further workup during this hospitalization    DVT prophylaxis: (Lovenox) Code Status: (Full) Family Communication:  Disposition Plan: Home Consults called: General Surgery, Dr Jackolyn Confer Admission status: (inpatient Margarita Sermons MD [facility] Pager [phone]  If 7PM-7AM, please contact night-coverage [email or web address] Password [email or web address]  06/29/2016, 8:11 AM

## 2016-06-29 NOTE — Anesthesia Postprocedure Evaluation (Addendum)
Anesthesia Post Note  Patient: Daniel [name]  Procedure(s) Performed: Procedure(s) (LRB): INCISION AND DRAINAGE ABDOMINAL WALL ABSCESS (N/A)  Patient location during evaluation: PACU Anesthesia Type: General Level of consciousness: sedated Pain management: pain level controlled Vital Signs Assessment: post-procedure vital signs reviewed and stable Respiratory status: spontaneous breathing and respiratory function stable Cardiovascular status: stable Anesthetic complications: no       Last Vitals:  Vitals:   06/29/16 1530 06/29/16 1551  BP: (!) 153/90 (!) 160/97  Pulse: 66 68  Resp: 13 14  Temp: 36.7 C 36.6 C    Last Pain:  Vitals:   06/29/16 1515  TempSrc:   PainSc: Asleep                 Jancy Sprankle DANIEL

## 2016-06-29 NOTE — Progress Notes (Signed)
Pharmacy Antibiotic Note  Daniel [name] is a 56 y.o. male admitted on 06/29/2016 with cellulitis.  Pharmacy has been consulted for Vancomycin and Zosyn dosing.  Plan: Vancomycin 1gm IV every 12 hours.  Goal trough 10-15 mcg/mL.  Zosyn 3.375g IV Q8H infused over 4hrs.   Height: 5\' 11"  (180.3 cm) Weight: 250 lb (113.4 kg) IBW/kg (Calculated) : 75.3  Temp (24hrs), Avg:98.5 F (36.9 C), Min:98.4 F (36.9 C), Max:98.6 F (37 C)   Recent Labs Lab 06/29/16 0152 06/29/16 0247  WBC 13.3*  --   CREATININE 1.24  --   LATICACIDVEN  --  3.5*    Estimated Creatinine Clearance: 85.1 mL/min (by C-G formula based on SCr of 1.24 mg/dL).    No Known Allergies  Antimicrobials this admission: Vancomycin 06/29/2016 >> Zosyn 06/29/2016 >>   Dose adjustments this admission: -  Microbiology results: pending  Thank you for allowing pharmacy to be a part of this patient's care.  Nani Skillern Crowford 06/29/2016 4:56 AM

## 2016-06-29 NOTE — ED Triage Notes (Signed)
Patient is having left quad abdominal pain for 4 days. Patient was having no n/v/d. Patient is having extreme pain with movement and tough.

## 2016-06-29 NOTE — Consult Note (Signed)
Reason for Consult: Abdominal wall cellulitis and abscess Referring Physician: Margarita Mail, PA  Daniel [name] is an 56 y.o. male.  HPI: This is a 56 year old male who began noticing pain in the lower abdominal wall 6 days ago.  He noticed a "knot in the area.  It is uncomfortable when he touches the area or coughs.  He presented to the emergency department for evaluation.  He met a code sepsis criteria at that time.  A CT scan was ordered which demonstrated findings concerning for abdominal wall abscess.  We were asked to see him in consultation because of that.  Past Medical History:  Diagnosis Date  . Diabetes mellitus without complication ([place])   . Hypertension   . Shoulder pain, left 06/28/2013    Past Surgical History:  Procedure Laterality Date  . APPENDECTOMY    . COLONOSCOPY  01/23/2012   Procedure: COLONOSCOPY;  Surgeon: Danie Binder, MD;  Location: [facility];  Service: Endoscopy;  Laterality: N/A;  11:10 AM  . Left heel surgery      Family History  Problem Relation Age of Onset  . Colon cancer Neg Hx     Social History:  reports that he has been smoking Cigarettes.  He has a 30.00 pack-year smoking history. He has never used smokeless tobacco. He reports that he drinks about 7.2 oz of alcohol per week . He reports that he uses drugs, including "Crack" cocaine.  Allergies: No Known Allergies Prior to Admission medications   Medication Sig Start Date End Date Taking? Authorizing Provider  benzonatate (TESSALON) 100 MG capsule Take 1 capsule (100 mg total) by mouth every 8 (eight) hours. Patient not taking: Reported on 06/29/2016 08/21/15   Zenovia Jarred, DO  cloNIDine (CATAPRES) 0.2 MG tablet Take 0.5 tablets (0.1 mg total) by mouth 2 (two) times daily. Patient not taking: Reported on 06/29/2016 06/28/13   Tanna Furry, MD  ibuprofen (ADVIL,MOTRIN) 600 MG tablet Take 1 tablet (600 mg total) by mouth every 6 (six) hours as needed. Patient not taking: Reported on 06/29/2016  08/21/15   Zenovia Jarred, DO    Results for orders placed or performed during the hospital encounter of 06/29/16 (from the past 48 hour(s))  Lipase, blood     Status: None   Collection Time: 06/29/16  1:52 AM  Result Value Ref Range   Lipase 45 11 - 51 U/L  Comprehensive metabolic panel     Status: Abnormal   Collection Time: 06/29/16  1:52 AM  Result Value Ref Range   Sodium 138 135 - 145 mmol/L   Potassium 3.9 3.5 - 5.1 mmol/L   Chloride 106 101 - 111 mmol/L   CO2 18 (L) 22 - 32 mmol/L   Glucose, Bld 133 (H) 65 - 99 mg/dL   BUN 16 6 - 20 mg/dL   Creatinine, Ser 1.24 0.61 - 1.24 mg/dL   Calcium 9.0 8.9 - 10.3 mg/dL   Total Protein 8.0 6.5 - 8.1 g/dL   Albumin 3.4 (L) 3.5 - 5.0 g/dL   AST 111 (H) 15 - 41 U/L   ALT 49 17 - 63 U/L   Alkaline Phosphatase 54 38 - 126 U/L   Total Bilirubin 0.8 0.3 - 1.2 mg/dL   GFR calc non Af Amer >60 >60 mL/min   GFR calc Af Amer >60 >60 mL/min    Comment: (NOTE) The eGFR has been calculated using the CKD EPI equation. This calculation has not been validated in all clinical  situations. eGFR's persistently <60 mL/min signify possible Chronic Kidney Disease.    Anion gap 14 5 - 15  CBC     Status: Abnormal   Collection Time: 06/29/16  1:52 AM  Result Value Ref Range   WBC 13.3 (H) 4.0 - 10.5 K/uL   RBC 3.93 (L) 4.22 - 5.81 MIL/uL   Hemoglobin 13.7 13.0 - 17.0 g/dL   HCT 39.2 39.0 - 52.0 %   MCV 99.7 78.0 - 100.0 fL   MCH 34.9 (H) 26.0 - 34.0 pg   MCHC 34.9 30.0 - 36.0 g/dL   RDW 13.0 11.5 - 15.5 %   Platelets 118 (L) 150 - 400 K/uL    Comment: RESULT REPEATED AND VERIFIED SPECIMEN CHECKED FOR CLOTS PLATELET COUNT CONFIRMED BY SMEAR   Urinalysis, Routine w reflex microscopic     Status: Abnormal   Collection Time: 06/29/16  1:53 AM  Result Value Ref Range   Color, Urine YELLOW YELLOW   APPearance CLEAR CLEAR   Specific Gravity, Urine 1.008 1.005 - 1.030   pH 5.0 5.0 - 8.0   Glucose, UA NEGATIVE NEGATIVE mg/dL   Hgb urine dipstick  SMALL (A) NEGATIVE   Bilirubin Urine NEGATIVE NEGATIVE   Ketones, ur NEGATIVE NEGATIVE mg/dL   Protein, ur 30 (A) NEGATIVE mg/dL   Nitrite NEGATIVE NEGATIVE   Leukocytes, UA NEGATIVE NEGATIVE   RBC / HPF 0-5 0 - 5 RBC/hpf   WBC, UA 0-5 0 - 5 WBC/hpf   Bacteria, UA RARE (A) NONE SEEN   Squamous Epithelial / LPF NONE SEEN NONE SEEN  Lactic acid, plasma     Status: Abnormal   Collection Time: 06/29/16  2:47 AM  Result Value Ref Range   Lactic Acid, Venous 3.5 (HH) 0.5 - 1.9 mmol/L    Comment: CRITICAL RESULT CALLED TO, READ BACK BY AND VERIFIED WITH: NASH,N RN [phone] [id number] C[place],N   Lactic acid, plasma     Status: None   Collection Time: 06/29/16  6:36 AM  Result Value Ref Range   Lactic Acid, Venous 1.7 0.5 - 1.9 mmol/L    Ct Abdomen Pelvis W Contrast  Result Date: 06/29/2016 CLINICAL DATA:  Abdominal pain. Evaluate for abdominal wall abscess. EXAM: CT ABDOMEN AND PELVIS WITH CONTRAST TECHNIQUE: Multidetector CT imaging of the abdomen and pelvis was performed using the standard protocol following bolus administration of intravenous contrast. CONTRAST:  155m ISOVUE-300 IOPAMIDOL (ISOVUE-300) INJECTION 61% COMPARISON:  None. FINDINGS: Lower chest: No pulmonary nodules. No visible pleural or pericardial effusion. Hepatobiliary: Normal hepatic size and contours without focal liver lesion. No perihepatic ascites. No intra- or extrahepatic biliary dilatation. There is a calcified granuloma in the left hepatic lobe. Normal gallbladder. Pancreas: Normal pancreatic contours and enhancement. No peripancreatic fluid collection or pancreatic ductal dilatation. Spleen: Normal. Adrenals/Urinary Tract: Normal adrenal glands. There is a nonobstructing left renal calculus at the lower pole measuring 3 mm. The right kidney is normal. Stomach/Bowel: No abnormal bowel dilatation. No bowel wall thickening or adjacent fat stranding to indicate acute inflammation. No abdominal fluid collection. The appendix is  absent. Vascular/Lymphatic: There is atherosclerotic calcification of the non aneurysmal abdominal aorta. No abdominal or pelvic adenopathy. Reproductive: The prostate is normal. Musculoskeletal: There is no bony spinal canal stenosis. No lytic or blastic lesions. There is lower lumbar osteophytosis. Other: Within the subcutaneous fat of the paracentral left lower quadrant, there is a intermediate attenuation collection measuring 4.7 x 3.0 x 2.8 cm with surrounding inflammatory stranding. IMPRESSION: 1. Intermediate attenuation collection  within the subcutaneous fat of the left lower quadrant abdomen. The appearances is consistent with phlegmon or early abscess formation. 2. Nonobstructing left nephrolithiasis with 3 mm stone at the lower pole. 3. Aortic atherosclerosis. Electronically Signed   By: Ulyses Jarred M.D.   On: 06/29/2016 04:25    Review of Systems  Constitutional: Negative for chills and fever.  Respiratory: Positive for shortness of breath.   Cardiovascular: Negative for chest pain.  Gastrointestinal: Positive for abdominal pain. Negative for nausea and vomiting.  Endo/Heme/Allergies:       No blood clots or bleeding disorders.   Blood pressure 167/98, pulse 83, temperature 99 F (37.2 C), temperature source Oral, resp. rate 16, height _0  (1.803 m), weight 113.4 kg (250 lb), SpO2 97 %. Physical Exam GENERAL APPEARANCE:  Overweight male in NAD.  Pleasant and cooperative.  EARS, NOSE, MOUTH THROAT:  [place]/AT external ears:  no lesions or deformities external nose:  no lesions or deformities hearing:  grossly normal lips:  moist, no deformities EYES external: conjunctiva, lids, sclerae normal pupils:  equal, round glasses: no   CV ascultation:  RRR, no murmur extremity edema:  no extremity varicosities:  no   RESP auscultation:  breath sounds equal and clear respiratory effort:  normal   GASTROINTESTINAL abdomen:  Soft, obese, 30 cm x 20 cm area of erythema left  lower abdominal wall with 6 cm tender area of fluctuance-difficult to examine because of significant pain to palpation. hernia:  none present scar:  RLQ  SKIN rash or lesion: see above subcutaneous nodules:  See above  NEUROLOGIC speech:  normal  PSYCHIATRIC alertness and orientation:  normal mood/affect/behavior:  normal judgement and insight:  normal   Assessment/Plan: Left lower quadrant abdominal wall abscess with significant surrounding cellulitis.  Very uncomfortable with the exam.  CT was reviewed.  Plan: He is being admitted by the medical service and starting broad-spectrum antibiotics.  I recommended incision and drainage of the abdominal wall abscess and I believe this is going to require general anesthesia given his level of discomfort with exam.  We discussed the procedure, rationale, and risks.  Risks include but are not limited to bleeding, infection, wound healing problems, anesthesia.  We also discussed the fact that this wound would need to heal secondary intention using dressing changes.  He seems to understand this and is agreeable with the plan.  Marketta Valadez J 06/29/2016, 8:14 AM

## 2016-06-29 NOTE — Transfer of Care (Signed)
Immediate Anesthesia Transfer of Care Note  Patient: Daniel [name]  Procedure(s) Performed: Procedure(s): INCISION AND DRAINAGE ABDOMINAL WALL ABSCESS (N/A)  Patient Location: PACU  Anesthesia Type:General  Level of Consciousness: sedated, patient cooperative and responds to stimulation  Airway & Oxygen Therapy: Patient Spontanous Breathing and Patient connected to face mask oxygen  Post-op Assessment: Report given to RN and Post -op Vital signs reviewed and stable  Post vital signs: Reviewed and stable  Last Vitals:  Vitals:   06/29/16 0957 06/29/16 1021  BP: 178/95 167/85  Pulse: 84 79  Resp: 16 20  Temp: 37.2 C 36.9 C    Last Pain:  Vitals:   06/29/16 1024  TempSrc:   PainSc: 9          Complications: No apparent anesthesia complications

## 2016-06-29 NOTE — ED Provider Notes (Signed)
Medical screening examination/treatment/procedure(s) were performed by non-physician practitioner and as supervising physician I was immediately available for consultation/collaboration.   EKG Interpretation None        Dellas Guard, MD 06/29/16 [phone]

## 2016-06-29 NOTE — ED Provider Notes (Signed)
[facility] DEPT Provider Note   CSN: [id number] Arrival date & time: 06/29/16  0133     History   Chief Complaint Chief Complaint  Patient presents with  . Abdominal Pain    HPI Daniel [name] is a 56 y.o. male with a past medical history of hypertension, diabetes who presents emergency Department with chief complaint of abdominal pain. Patient is a very poor historian. He states that he has "a knot" on the bottom of his belly. It has been painful and worsening over the past couple of days .  He states that he is unsure if he is diabetic or not. He denies fevers or chills. He states that the pain is severe when he touches his belly or coughs or moves. Patient is a poor historian. History is limited by the capacity of the patient, patient's ability to communicate effectively, and overall poor insight.     HPI  Past Medical History:  Diagnosis Date  . Diabetes mellitus without complication ([place])   . Hypertension   . Shoulder pain, left 06/28/2013    There are no active problems to display for this patient.   Past Surgical History:  Procedure Laterality Date  . APPENDECTOMY    . COLONOSCOPY  01/23/2012   Procedure: COLONOSCOPY;  Surgeon: Danie Binder, MD;  Location: [facility];  Service: Endoscopy;  Laterality: N/A;  11:10 AM  . Left heel surgery         Home Medications    Prior to Admission medications   Medication Sig Start Date End Date Taking? Authorizing Provider  benzonatate (TESSALON) 100 MG capsule Take 1 capsule (100 mg total) by mouth every 8 (eight) hours. 08/21/15   Zenovia Jarred, DO  cloNIDine (CATAPRES) 0.2 MG tablet Take 0.5 tablets (0.1 mg total) by mouth 2 (two) times daily. 06/28/13   Tanna Furry, MD  ibuprofen (ADVIL,MOTRIN) 600 MG tablet Take 1 tablet (600 mg total) by mouth every 6 (six) hours as needed. 08/21/15   Zenovia Jarred, DO    Family History Family History  Problem Relation Age of Onset  . Colon cancer Neg Hx     Social  History Social History  Substance Use Topics  . Smoking status: Current Every Day Smoker    Packs/day: 1.00    Years: 30.00    Types: Cigarettes  . Smokeless tobacco: Never Used  . Alcohol use 7.2 oz/week    12 Cans of beer per week     Allergies   Patient has no known allergies.   Review of Systems Review of Systems Ten systems reviewed and are negative for acute change, except as noted in the HPI.    Physical Exam Updated Vital Signs BP 131/85 (BP Location: Right Arm)   Pulse 91   Temp 98.4 F (36.9 C) (Oral)   Resp 14   Ht 5\' 11"  (1.803 m)   Wt 113.4 kg   SpO2 99%   BMI 34.87 kg/m   Physical Exam  Constitutional: He appears well-developed and well-nourished. No distress.  HENT:  Head: Normocephalic and atraumatic.  Eyes: Conjunctivae are normal. No scleral icterus.  Neck: Normal range of motion. Neck supple.  Cardiovascular: Normal rate, regular rhythm and normal heart sounds.   Pulmonary/Chest: Effort normal and breath sounds normal. No respiratory distress.  Abdominal: Soft. There is no tenderness.  Musculoskeletal: He exhibits no edema.  Neurological: He is alert.  Skin: Skin is warm and dry. He is not diaphoretic.  Psychiatric: His  behavior is normal.  Nursing note and vitals reviewed.    ED Treatments / Results  Labs (all labs ordered are listed, but only abnormal results are displayed) Labs Reviewed  LIPASE, BLOOD  COMPREHENSIVE METABOLIC PANEL  CBC  URINALYSIS, ROUTINE W REFLEX MICROSCOPIC  I-STAT CG4 LACTIC ACID, ED    EKG  EKG Interpretation None       Radiology No results found.  Procedures Procedures (including critical care time)  Medications Ordered in ED Medications - No data to display   Initial Impression / Assessment and Plan / ED Course  I have reviewed the triage vital signs and the nursing notes.  Pertinent labs & imaging results that were available during my care of the patient were reviewed by me and  considered in my medical decision making (see chart for details).  Clinical Course as of Jun 29 717  Sun Jun 29, 2016  0420 WBC: (!) 13.3 [AH]  7619 Patient meets sepsis protocol. I have ordered cultures and IV abx. CT pending. Patient is HDS. Lactic Acid, Venous: (!!) 3.5 [AH]  0548 Abscess vs. Phlegmon in the abdominal wall. Will need admission and surgery consult. CT ABDOMEN PELVIS W CONTRAST [AH]    Clinical Course User Index [AH] Margarita Mail, PA-C    Patient with epidural abscess and cellulitis. He also has serious or sepsis criteria and treated as such with blood cultures, pink and Zosyn. I discussed the case with the surgeons who will consult on the patient. Patient will be admitted to the hospitalist. Afebrile and hemodynamically stable.  Final Clinical Impressions(s) / ED Diagnoses   Final diagnoses:  Abdominal wall abscess  Cellulitis of abdominal wall    New Prescriptions New Prescriptions   No medications on file     Ned Grace 06/29/16 5093    April Palumbo, MD 06/29/16 [phone]

## 2016-06-29 NOTE — Anesthesia Procedure Notes (Signed)
Procedure Name: LMA Insertion Performed by: Gean Maidens Pre-anesthesia Checklist: Patient identified, Emergency Drugs available, Suction available, Patient being monitored and Timeout performed Patient Re-evaluated:Patient Re-evaluated prior to inductionOxygen Delivery Method: Circle system utilized Preoxygenation: Pre-oxygenation with 100% oxygen Intubation Type: IV induction Ventilation: Mask ventilation without difficulty LMA: LMA inserted LMA Size: 5.0 Number of attempts: 1 Placement Confirmation: positive ETCO2,  CO2 detector and breath sounds checked- equal and bilateral Tube secured with: Tape Dental Injury: Teeth and Oropharynx as per pre-operative assessment

## 2016-06-29 NOTE — ED Notes (Signed)
Patient lower abdomin on the skin is reddened and warm to touch. Patient is having pain when palpation is done in any quadrant of his abdomen. Patient states that he did not know he is a diabetic.

## 2016-06-29 NOTE — Anesthesia Preprocedure Evaluation (Addendum)
Anesthesia Evaluation  Patient identified by MRN, date of birth, ID band Patient awake    Reviewed: Allergy & Precautions, NPO status , Patient's Chart, lab work & pertinent test results  History of Anesthesia Complications Negative for: history of anesthetic complications  Airway Mallampati: II  TM Distance: >3 FB Neck ROM: Full    Dental  (+) Dental Advisory Given, Poor Dentition   Pulmonary Current Smoker,    Pulmonary exam normal        Cardiovascular hypertension, negative cardio ROS Normal cardiovascular exam     Neuro/Psych negative neurological ROS  negative psych ROS   GI/Hepatic negative GI ROS, (+)     substance abuse  alcohol use and cocaine use,   Endo/Other  negative endocrine ROSdiabetesMorbid obesity  Renal/GU negative Renal ROS  negative genitourinary   Musculoskeletal negative musculoskeletal ROS (+)   Abdominal   Peds negative pediatric ROS (+)  Hematology negative hematology ROS (+)   Anesthesia Other Findings   Reproductive/Obstetrics negative OB ROS                            Anesthesia Physical Anesthesia Plan  ASA: III and emergent  Anesthesia Plan: General   Post-op Pain Management:    Induction: Intravenous  Airway Management Planned: Oral ETT and LMA  Additional Equipment:   Intra-op Plan:   Post-operative Plan: Extubation in OR  Informed Consent: I have reviewed the patients History and Physical, chart, labs and discussed the procedure including the risks, benefits and alternatives for the proposed anesthesia with the patient or authorized representative who has indicated his/her understanding and acceptance.   Dental advisory given  Plan Discussed with: Surgeon, Anesthesiologist and CRNA  Anesthesia Plan Comments:        Anesthesia Quick Evaluation

## 2016-06-29 NOTE — Op Note (Signed)
06/29/2016  2:22 PM  PATIENT:  Daniel [name], 56 y.o., male, MRN: [id number]  PREOP DIAGNOSIS:  abdominal wall abscess  POSTOP DIAGNOSIS:   Left lower abdominal wall abscess (6 x 4.5 cm)  PROCEDURE:   Procedure(s):  INCISION AND DRAINAGE ABDOMINAL WALL ABSCESS  SURGEON:   Alphonsa Overall, M.D.  ASSISTANT:   Arlana Lindau, M.D.  ANESTHESIA:   general  Anesthesiologist: Duane Boston, MD CRNA: Gean Maidens, CRNA  General  EBL:  50  ml  BLOOD ADMINISTERED: none  DRAINS: none   LOCAL MEDICATIONS USED:   None  SPECIMEN:   Aerobe and anaerobe cultures  COUNTS CORRECT:  YES  INDICATIONS FOR PROCEDURE:  [name] is a 56 y.o. (DOB: 1960-12-16) AA male whose primary care physician is FANTA,TESFAYE, MD and comes for I&D of left lower abdominal wall abscess.   The indications and risks of the surgery were explained to the patient.  The risks include, but are not limited to, infection, bleeding, and nerve injury.  PROCEDURE:  The patient was taken to room #1 at [facility] operating room room. He was started on Zosyn and vancomycin as antibiotic.  His abdomen was prepped with ChloraPrep.   A timeout was held and surgical checklist run.   He had an abscess in his left pannus  which was about 6 cm in diameter. I made a cruciate incision into this abscess and obtain cultures for aerobes and anaerobes.   I ended up making a cruciate incision 6 cm x 4.5 cm. There was no evidence of the abscess tracking deep. I packed the wound with 6 inch Kerlix gauze.   He was taken to the recovery room in good condition. We will start dressing changes tomorrow.  Alphonsa Overall, MD, [facility] Surgery Pager: [phone] Office phone:  [phone]

## 2016-06-30 ENCOUNTER — Encounter (HOSPITAL_COMMUNITY): Payer: Self-pay | Admitting: Surgery

## 2016-06-30 DIAGNOSIS — L03311 Cellulitis of abdominal wall: Secondary | ICD-10-CM

## 2016-06-30 LAB — HEMOGLOBIN A1C
HEMOGLOBIN A1C: 6.2 % — AB (ref 4.8–5.6)
Mean Plasma Glucose: 131 mg/dL

## 2016-06-30 LAB — CBC
HEMATOCRIT: 36.1 % — AB (ref 39.0–52.0)
Hemoglobin: 12.1 g/dL — ABNORMAL LOW (ref 13.0–17.0)
MCH: 34.3 pg — AB (ref 26.0–34.0)
MCHC: 33.5 g/dL (ref 30.0–36.0)
MCV: 102.3 fL — ABNORMAL HIGH (ref 78.0–100.0)
Platelets: 100 10*3/uL — ABNORMAL LOW (ref 150–400)
RBC: 3.53 MIL/uL — ABNORMAL LOW (ref 4.22–5.81)
RDW: 13.2 % (ref 11.5–15.5)
WBC: 6 10*3/uL (ref 4.0–10.5)

## 2016-06-30 LAB — COMPREHENSIVE METABOLIC PANEL
ALBUMIN: 2.7 g/dL — AB (ref 3.5–5.0)
ALK PHOS: 50 U/L (ref 38–126)
ALT: 40 U/L (ref 17–63)
AST: 81 U/L — AB (ref 15–41)
Anion gap: 7 (ref 5–15)
BILIRUBIN TOTAL: 0.7 mg/dL (ref 0.3–1.2)
BUN: 20 mg/dL (ref 6–20)
CALCIUM: 8.1 mg/dL — AB (ref 8.9–10.3)
CO2: 23 mmol/L (ref 22–32)
Chloride: 107 mmol/L (ref 101–111)
Creatinine, Ser: 1.26 mg/dL — ABNORMAL HIGH (ref 0.61–1.24)
GFR calc Af Amer: >60 mL/min (ref >60)
GFR calc non Af Amer: >60 mL/min (ref >60)
GLUCOSE: 154 mg/dL — AB (ref 65–99)
POTASSIUM: 4 mmol/L (ref 3.5–5.1)
Sodium: 137 mmol/L (ref 135–145)
TOTAL PROTEIN: 6.8 g/dL (ref 6.5–8.1)

## 2016-06-30 LAB — HIV ANTIBODY (ROUTINE TESTING W REFLEX): HIV Screen 4th Generation wRfx: NONREACTIVE

## 2016-06-30 LAB — GLUCOSE, CAPILLARY
GLUCOSE-CAPILLARY: 131 mg/dL — AB (ref 65–99)
Glucose-Capillary: 121 mg/dL — ABNORMAL HIGH (ref 65–99)
Glucose-Capillary: 106 mg/dL — ABNORMAL HIGH (ref 65–99)
Glucose-Capillary: 191 mg/dL — ABNORMAL HIGH (ref 65–99)

## 2016-06-30 MED ORDER — MORPHINE SULFATE (PF) 4 MG/ML IV SOLN
1.0000 mg | INTRAVENOUS | Status: DC | PRN
  Administered 2016-06-30 – 2016-07-01 (×3): 3 mg via INTRAVENOUS
  Filled 2016-06-30 (×3): qty 1

## 2016-06-30 MED ORDER — HYDROCHLOROTHIAZIDE 25 MG PO TABS
25.0000 mg | ORAL_TABLET | Freq: Every day | ORAL | Status: DC
  Administered 2016-06-30 – 2016-07-01 (×2): 25 mg via ORAL
  Filled 2016-06-30 (×3): qty 1

## 2016-06-30 MED ORDER — METFORMIN HCL 500 MG PO TABS
500.0000 mg | ORAL_TABLET | Freq: Every day | ORAL | Status: DC
  Administered 2016-07-01: 500 mg via ORAL
  Filled 2016-06-30: qty 1

## 2016-06-30 MED ORDER — DOXYCYCLINE HYCLATE 100 MG PO TABS
100.0000 mg | ORAL_TABLET | Freq: Two times a day (BID) | ORAL | Status: DC
  Administered 2016-06-30 – 2016-07-01 (×2): 100 mg via ORAL
  Filled 2016-06-30 (×2): qty 1

## 2016-06-30 MED ORDER — ACETAMINOPHEN 325 MG PO TABS: 650.0000 mg | ORAL_TABLET | Freq: Four times a day (QID) | ORAL | Status: DC | PRN

## 2016-06-30 MED ORDER — METHOCARBAMOL 500 MG PO TABS
500.0000 mg | ORAL_TABLET | Freq: Three times a day (TID) | ORAL | Status: DC | PRN
  Administered 2016-06-30: 500 mg via ORAL
  Filled 2016-06-30: qty 1

## 2016-06-30 MED ORDER — OXYCODONE HCL 5 MG PO TABS
5.0000 mg | ORAL_TABLET | ORAL | Status: DC | PRN
  Administered 2016-06-30 (×2): 10 mg via ORAL
  Filled 2016-06-30 (×2): qty 2

## 2016-06-30 NOTE — Progress Notes (Signed)
Patient ID: Daniel [name], male   DOB: [birth year], 56 y.o.   MRN: [id number]  [facility] Surgery Progress Note  1 Day Post-Op  Subjective: Feeling ok this morning. Abdomen sore but pain well controlled. Tolerating diet.  Objective: Vital signs in last 24 hours: Temp:  [97.5 F (36.4 C)-98.9 F (37.2 C)] 97.5 F (36.4 C) (03/05 0503) Pulse Rate:  [62-84] 64 (03/05 0503) Resp:  [9-20] 18 (03/05 0503) BP: (140-191)/(80-110) 154/89 (03/05 0503) SpO2:  [96 %-100 %] 100 % (03/05 0503) Weight:  [260 lb 4.8 oz (118.1 kg)] 260 lb 4.8 oz (118.1 kg) (03/04 1021) Last BM Date: 06/28/16  Intake/Output from previous day: 03/04 0701 - 03/05 0700 In: 4410 [P.O.:840; I.V.:3270; IV Piggyback:300] Out: 2325 [Urine:2300; Blood:25] Intake/Output this shift: No intake/output data recorded.  PE: Gen:  Alert, NAD, pleasant Pulm:  Effort normal Abd: Soft, ND, +BS, no HSM, lower abdominal abscess under pannus s/p I&D with bloody drainage, no purulent drainage or noticeable surrounding erythema >> dressing changed  Lab Results:   Recent Labs  06/29/16 1131 06/30/16 0507  WBC 10.5 6.0  HGB 14.3 12.1*  HCT 40.9 36.1*  PLT 124* 100*   BMET  Recent Labs  06/29/16 1131 06/30/16 0507  NA 140 137  K 4.0 4.0  CL 110 107  CO2 22 23  GLUCOSE 109* 154*  BUN 14 20  CREATININE 1.13 1.26*  CALCIUM 9.0 8.1*   PT/INR  Recent Labs  06/29/16 1131  LABPROT 12.6  INR 0.94   CMP     Component Value Date/Time   NA 137 06/30/2016 0507   K 4.0 06/30/2016 0507   CL 107 06/30/2016 0507   CO2 23 06/30/2016 0507   GLUCOSE 154 (H) 06/30/2016 0507   BUN 20 06/30/2016 0507   CREATININE 1.26 (H) 06/30/2016 0507   CALCIUM 8.1 (L) 06/30/2016 0507   PROT 6.8 06/30/2016 0507   ALBUMIN 2.7 (L) 06/30/2016 0507   AST 81 (H) 06/30/2016 0507   ALT 40 06/30/2016 0507   ALKPHOS 50 06/30/2016 0507   BILITOT 0.7 06/30/2016 0507   GFRNONAA >60 06/30/2016 0507   GFRAA >60 06/30/2016 0507    Lipase     Component Value Date/Time   LIPASE 45 06/29/2016 0152       Studies/Results: Ct Abdomen Pelvis W Contrast  Result Date: 06/29/2016 CLINICAL DATA:  Abdominal pain. Evaluate for abdominal wall abscess. EXAM: CT ABDOMEN AND PELVIS WITH CONTRAST TECHNIQUE: Multidetector CT imaging of the abdomen and pelvis was performed using the standard protocol following bolus administration of intravenous contrast. CONTRAST:  16mL ISOVUE-300 IOPAMIDOL (ISOVUE-300) INJECTION 61% COMPARISON:  None. FINDINGS: Lower chest: No pulmonary nodules. No visible pleural or pericardial effusion. Hepatobiliary: Normal hepatic size and contours without focal liver lesion. No perihepatic ascites. No intra- or extrahepatic biliary dilatation. There is a calcified granuloma in the left hepatic lobe. Normal gallbladder. Pancreas: Normal pancreatic contours and enhancement. No peripancreatic fluid collection or pancreatic ductal dilatation. Spleen: Normal. Adrenals/Urinary Tract: Normal adrenal glands. There is a nonobstructing left renal calculus at the lower pole measuring 3 mm. The right kidney is normal. Stomach/Bowel: No abnormal bowel dilatation. No bowel wall thickening or adjacent fat stranding to indicate acute inflammation. No abdominal fluid collection. The appendix is absent. Vascular/Lymphatic: There is atherosclerotic calcification of the non aneurysmal abdominal aorta. No abdominal or pelvic adenopathy. Reproductive: The prostate is normal. Musculoskeletal: There is no bony spinal canal stenosis. No lytic or blastic lesions. There is lower lumbar osteophytosis.  Other: Within the subcutaneous fat of the paracentral left lower quadrant, there is a intermediate attenuation collection measuring 4.7 x 3.0 x 2.8 cm with surrounding inflammatory stranding. IMPRESSION: 1. Intermediate attenuation collection within the subcutaneous fat of the left lower quadrant abdomen. The appearances is consistent with phlegmon or  early abscess formation. 2. Nonobstructing left nephrolithiasis with 3 mm stone at the lower pole. 3. Aortic atherosclerosis. Electronically Signed   By: Ulyses Jarred M.D.   On: 06/29/2016 04:25   Dg Chest Port 1 View  Result Date: 06/29/2016 CLINICAL DATA:  Sepsis. EXAM: PORTABLE CHEST 1 VIEW COMPARISON:  Radiographs of August 21, 2015. FINDINGS: The heart size and mediastinal contours are within normal limits. Both lungs are clear. No pneumothorax or pleural effusion is noted. The visualized skeletal structures are unremarkable. IMPRESSION: No acute cardiopulmonary abnormality seen. Electronically Signed   By: Marijo Conception, M.D.   On: 06/29/2016 12:22    Anti-infectives: Anti-infectives    Start     Dose/Rate Route Frequency Ordered Stop   06/30/16 0600  piperacillin-tazobactam (ZOSYN) 3.375 g in dextrose 5 % 50 mL IVPB     3.375 g 12.5 mL/hr over 240 Minutes Intravenous Every 8 hours 06/29/16 2357     06/29/16 1400  piperacillin-tazobactam (ZOSYN) IVPB 3.375 g  Status:  Discontinued     3.375 g 12.5 mL/hr over 240 Minutes Intravenous Every 8 hours 06/29/16 0455 06/29/16 2357   06/29/16 1200  vancomycin (VANCOCIN) IVPB 1000 mg/200 mL premix  Status:  Discontinued     1,000 mg 200 mL/hr over 60 Minutes Intravenous Every 12 hours 06/29/16 0455 06/29/16 1127   06/29/16 1200  vancomycin (VANCOCIN) IVPB 750 mg/150 ml premix     750 mg 150 mL/hr over 60 Minutes Intravenous 2 times daily 06/29/16 1127     06/29/16 0400  piperacillin-tazobactam (ZOSYN) IVPB 3.375 g     3.375 g 100 mL/hr over 30 Minutes Intravenous  Once 06/29/16 0347 06/29/16 0437   06/29/16 0400  vancomycin (VANCOCIN) IVPB 1000 mg/200 mL premix     1,000 mg 200 mL/hr over 60 Minutes Intravenous  Once 06/29/16 0347 06/29/16 0555       Assessment/Plan Aabdominal wall abscess S/p INCISION AND DRAINAGE ABDOMINAL WALL ABSCESS 3/4 Dr. Lucia Gaskins - POD 1 - culture pending, gram stain growing GRAM NEGATIVE RODS and GRAM POSITIVE  COCCI IN CLUSTERS  DM - SSI. Continue working on better blood glucose control HTN Alcohol abuse - CIWA Tobacco abuse - nicotine patch  ID - zosyn/vanco 3/4>> FEN - carb modified diet VTE - lovenox  Plan - Dressing repacked with saline dampened gauze. Continue BID dressing changes. Encourage daily shower with wound open. Continue IV antibiotics and follow culture. Internal medicine requesting patient to be transitioned to our service, will discuss with Md. Home health consulted for wound care.   LOS: 1 day    Jerrye Beavers , [facility] Surgery 06/30/2016, 9:55 AM Pager: [phone] Consults: [phone] Mon-Fri 7:00 am-4:30 pm Sat-Sun 7:00 am-11:30 am

## 2016-06-30 NOTE — Progress Notes (Signed)
Pt discharging with [place] and appointment with [place].

## 2016-06-30 NOTE — Care Management Note (Signed)
Case Management Note  Patient Details  Name: Daniel [name] MRN: [id number] Date of Birth: Dec 18, 1960  Subjective/Objective:   Pt admitted with lower abd pain. Found to have Cellulitis of abdominal wall.                Action/Plan: plan for surgery, app   Expected Discharge Date:  06/30/16               Expected Discharge Plan:  Home/Self Care  In-House Referral:  Clinical Social Work  Discharge planning Services  CM Consult, Appointment with [place] 07/24/16 at 2:00 PM  Post Acute Care Choice:  NA Choice offered to:  Patient  DME Arranged:  N/A DME Agency:     HH Arranged:    [place] Agency:     Status of Service:  In process, will continue to follow  If discussed at Long Length of Stay Meetings, dates discussed:    Additional CommentsPurcell Mouton, RN 06/30/2016, 3:06 PM

## 2016-06-30 NOTE — Progress Notes (Signed)
Inpatient Diabetes Program Recommendations  AACE/ADA: New Consensus Statement on Inpatient Glycemic Control (2015)  Target Ranges:  Prepandial:   less than 140 mg/dL      Peak postprandial:   less than 180 mg/dL (1-2 hours)      Critically ill patients:  140 - 180 mg/dL   Lab Results  Component Value Date   GLUCAP 106 (H) 06/30/2016   HGBA1C 6.2 (H) 06/29/2016    Review of Glycemic Control  Diabetes history: DM2 Outpatient Diabetes medications: None - previously on metformin Current orders for Inpatient glycemic control: metformin 500 mg QD, Novolog 0-15 units tidwc HgbA1C - 6.2%.  Inpatient Diabetes Program Recommendations:    Caution metformin with ETOH abuse. Add HS correction. Will need prescription for glucose meter and supplies. PCP - pt will be seen at [facility] and get meds at [facility] at discharge.  Long discussion with pt regarding importance of controlling blood sugars at home to prevent long-term complications. Pt states his top 3 problems are his neuropathy in feet, sore on foot and shoulder pain. States he drinks about 4-6 beers/day "spread out - not all at one time."  Very little exercise d/t feet hurting. Uses the "egg" to trim his calluses and the rough areas continue to come back. Discussed diet, exercise and stress management with regard to glycemic control. Stressed importance of f/u with PCP for glucose management. Answered questions.  Will f/u in am.  Thank you. Lorenda Peck, RD, LDN, CDE Inpatient Diabetes Coordinator [phone]

## 2016-06-30 NOTE — Progress Notes (Signed)
PROGRESS NOTE    THUNDER BRIDGEWATER  IOX:[id number] DOB: 09-20-1960 DOA: 06/29/2016 PCP: Rosita Fire, MD  Outpatient Specialists:     Brief Narrative:  56 y/o ? 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 CT showed this. General surgery consulted recommended medical admission Abscess drained in the OR    Assessment & Plan:   Principal Problem:   Cellulitis of abdominal wall Active Problems:   Abscess of abdominal wall   Hypertension   DM2 (diabetes mellitus, type 2) ([place])   Sepsis ([place])   Sepsis secondary to abscess status post I&D 06/29/16-resolved. GR + cocci /gram-negative rods Narrow to doxycycline 100 twice a day, discontinue Vanco/Zosyn. Discontinue 125 cc/hIV fluids-dressings as per general surgery. Outpatient follow-up with general surgery 1-2 weeks-pain management Percocet 30 tablets on discharge Diabetes mellitus-A1c 6.2-start metformin 500, needs education Hypertension-on clonidine as an outpatient. Poor choice of agent. 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 Some pain. Wound just packed and very small. Poor tolerance according to surgeon who I spoke to No fever no chills Does not recall being called diabetic  Objective: Vitals:   06/29/16 1817 06/29/16 1924 06/29/16 2050 06/30/16 0503  BP: 140/80 (!) 144/96 (!) 164/106 (!) 154/89  Pulse: 77 65 62 64  Resp: 17 20 18 18   Temp: 97.6 F (36.4 C) 97.7 F (36.5 C) 97.7 F (36.5 C) 97.5  F (36.4 C)  TempSrc: Oral Oral Oral Oral  SpO2: 99% 99% 100% 100%  Weight:      Height:        Intake/Output Summary (Last 24 hours) at 06/30/16 1329 Last data filed at 06/30/16 1000  Gross per 24 hour  Intake             5010 ml  Output             1325 ml  Net             3685 ml   Filed Weights   06/29/16 0152 06/29/16 1021  Weight: 113.4 kg (250 lb) 118.1 kg (260 lb 4.8 oz)    Examination:  General exam: Appears calm and comfortable  Respiratory system: Clear to auscultation. Respiratory effort normal. Cardiovascular system: S1 & S2 heard, RRR. No JVD, murmurs, rubs, gallops or clicks. No pedal edema. Gastrointestinal system: Abdomen is nondistended, soft and nontender. as wound was just dressed did not examine Neurologically stable    Data Reviewed: I have personally reviewed following labs and imaging studies  CBC:  Recent Labs Lab 06/29/16 0152 06/29/16 1131 06/30/16 0507  WBC 13.3* 10.5 6.0  NEUTROABS  --  6.2  --   HGB 13.7 14.3 12.1*  HCT 39.2 40.9 36.1*  MCV 99.7 101.5* 102.3*  PLT 118* 124* 268*   Basic Metabolic Panel:  Recent Labs Lab 06/29/16 0152 06/29/16 1131 06/30/16 0507  NA 138 140 137  K 3.9 4.0 4.0  CL 106 110 107  CO2 18* 22 23  GLUCOSE 133* 109* 154*  BUN 16 14 20   CREATININE 1.24 1.13 1.26*  CALCIUM 9.0 9.0 8.1*  GFR: Estimated Creatinine Clearance: 85.6 mL/min (by C-G formula based on SCr of 1.26 mg/dL (H)). Liver Function Tests:  Recent Labs Lab 06/29/16 0152 06/29/16 1131 06/30/16 0507  AST 111* 106* 81*  ALT 49 51 40  ALKPHOS 54 58 50  BILITOT 0.8 1.0 0.7  PROT 8.0 8.1 6.8  ALBUMIN 3.4* 3.4* 2.7*    Recent Labs Lab 06/29/16 0152  LIPASE 45   No results for input(s): AMMONIA in the last 168 hours. Coagulation Profile:  Recent Labs Lab 06/29/16 1131  INR 0.94   Cardiac Enzymes:  Recent Labs Lab 06/29/16 1131 06/29/16 1924 06/29/16 2243  TROPONINI <0.03 <0.03 <0.03   BNP (last 3  results) No results for input(s): PROBNP in the last 8760 hours. HbA1C:  Recent Labs  06/29/16 1131  HGBA1C 6.2*   CBG:  Recent Labs Lab 06/29/16 1516 06/29/16 1639 06/29/16 2049 06/30/16 0831 06/30/16 1246  GLUCAP 110* 98 117* 121* 106*   Lipid Profile: No results for input(s): CHOL, HDL, LDLCALC, TRIG, CHOLHDL, LDLDIRECT in the last 72 hours. Thyroid Function Tests:  Recent Labs  06/29/16 1131  TSH 2.833   Anemia Panel: No results for input(s): VITAMINB12, FOLATE, FERRITIN, TIBC, IRON, RETICCTPCT in the last 72 hours. Urine analysis:    Component Value Date/Time   COLORURINE YELLOW 06/29/2016 0153   APPEARANCEUR CLEAR 06/29/2016 0153   LABSPEC 1.008 06/29/2016 0153   PHURINE 5.0 06/29/2016 0153   GLUCOSEU NEGATIVE 06/29/2016 0153   HGBUR SMALL (A) 06/29/2016 0153   BILIRUBINUR NEGATIVE 06/29/2016 0153   KETONESUR NEGATIVE 06/29/2016 0153   PROTEINUR 30 (A) 06/29/2016 0153   UROBILINOGEN 0.2 12/31/2009 0746   NITRITE NEGATIVE 06/29/2016 0153   LEUKOCYTESUR NEGATIVE 06/29/2016 0153   Sepsis Labs: @LABRCNTIP (procalcitonin:4,lacticidven:4)  ) Recent Results (from the past 240 hour(s))  MRSA PCR Screening     Status: None   Collection Time: 06/29/16 11:45 AM  Result Value Ref Range Status   MRSA by PCR NEGATIVE NEGATIVE Final    Comment:        The GeneXpert MRSA Assay (FDA approved for NASAL specimens only), is one component of a comprehensive MRSA colonization surveillance program. It is not intended to diagnose MRSA infection nor to guide or monitor treatment for MRSA infections.   Aerobic/Anaerobic Culture (surgical/deep wound)     Status: None (Preliminary result)   Collection Time: 06/29/16  1:00 PM  Result Value Ref Range Status   Specimen Description ABDOMEN ABSCESS  Final   Special Requests NONE  Final   Gram Stain   Final    ABUNDANT WBC PRESENT, PREDOMINANTLY PMN ABUNDANT GRAM NEGATIVE RODS ABUNDANT GRAM POSITIVE COCCI IN CLUSTERS     Culture   Final    TOO YOUNG TO READ Performed at [facility], [place] [address], [place], [place] [address]    Report Status PENDING  Incomplete         Radiology Studies: Ct Abdomen Pelvis W Contrast  Result Date: 06/29/2016 CLINICAL DATA:  Abdominal pain. Evaluate for abdominal wall abscess. EXAM: CT ABDOMEN AND PELVIS WITH CONTRAST TECHNIQUE: Multidetector CT imaging of the abdomen and pelvis was performed using the standard protocol following bolus administration of intravenous contrast. CONTRAST:  177mL ISOVUE-300 IOPAMIDOL (ISOVUE-300) INJECTION 61% COMPARISON:  None. FINDINGS: Lower chest: No pulmonary nodules. No visible pleural or pericardial effusion. Hepatobiliary: Normal hepatic size and contours without focal liver lesion. No perihepatic ascites. No intra- or extrahepatic biliary dilatation. There is a calcified granuloma in the left hepatic  lobe. Normal gallbladder. Pancreas: Normal pancreatic contours and enhancement. No peripancreatic fluid collection or pancreatic ductal dilatation. Spleen: Normal. Adrenals/Urinary Tract: Normal adrenal glands. There is a nonobstructing left renal calculus at the lower pole measuring 3 mm. The right kidney is normal. Stomach/Bowel: No abnormal bowel dilatation. No bowel wall thickening or adjacent fat stranding to indicate acute inflammation. No abdominal fluid collection. The appendix is absent. Vascular/Lymphatic: There is atherosclerotic calcification of the non aneurysmal abdominal aorta. No abdominal or pelvic adenopathy. Reproductive: The prostate is normal. Musculoskeletal: There is no bony spinal canal stenosis. No lytic or blastic lesions. There is lower lumbar osteophytosis. Other: Within the subcutaneous fat of the paracentral left lower quadrant, there is a intermediate attenuation collection measuring 4.7 x 3.0 x 2.8 cm with surrounding inflammatory stranding. IMPRESSION: 1. Intermediate attenuation collection within the  subcutaneous fat of the left lower quadrant abdomen. The appearances is consistent with phlegmon or early abscess formation. 2. Nonobstructing left nephrolithiasis with 3 mm stone at the lower pole. 3. Aortic atherosclerosis. Electronically Signed   By: Ulyses Jarred M.D.   On: 06/29/2016 04:25   Dg Chest Port 1 View  Result Date: 06/29/2016 CLINICAL DATA:  Sepsis. EXAM: PORTABLE CHEST 1 VIEW COMPARISON:  Radiographs of August 21, 2015. FINDINGS: The heart size and mediastinal contours are within normal limits. Both lungs are clear. No pneumothorax or pleural effusion is noted. The visualized skeletal structures are unremarkable. IMPRESSION: No acute cardiopulmonary abnormality seen. Electronically Signed   By: Marijo Conception, M.D.   On: 06/29/2016 12:22        Scheduled Meds: . cloNIDine  0.1 mg Oral BID  . enoxaparin (LOVENOX) injection  40 mg Subcutaneous Q24H  . folic acid  1 mg Oral Daily  . insulin aspart  0-15 Units Subcutaneous TID WC  . multivitamin with minerals  1 tablet Oral Daily  . nicotine  21 mg Transdermal Daily  . piperacillin-tazobactam (ZOSYN)  IV  3.375 g Intravenous Q8H  . thiamine  100 mg Oral Daily   Or  . thiamine  100 mg Intravenous Daily  . vancomycin  750 mg Intravenous BID   Continuous Infusions: . sodium chloride 150 mL/hr at 06/30/16 1249     LOS: 1 day    Time spent: [place], MD [facility]   If 7PM-7AM, please contact night-coverage [email or web address] Password [email or web address] 06/30/2016, 1:29 PM

## 2016-07-01 LAB — GLUCOSE, CAPILLARY: Glucose-Capillary: 111 mg/dL — ABNORMAL HIGH (ref 65–99)

## 2016-07-01 LAB — CREATININE, SERUM
Creatinine, Ser: 1.2 mg/dL (ref 0.61–1.24)
GFR calc non Af Amer: >60 mL/min (ref >60)

## 2016-07-01 MED ORDER — CLONIDINE HCL 0.1 MG PO TABS: 0.1000 mg | ORAL_TABLET | Freq: Two times a day (BID) | ORAL | 11 refills | Status: DC

## 2016-07-01 MED ORDER — METFORMIN HCL 500 MG PO TABS: 500.0000 mg | ORAL_TABLET | Freq: Every day | ORAL | 0 refills | Status: DC

## 2016-07-01 MED ORDER — CLONIDINE HCL 0.2 MG PO TABS: 0.1000 mg | ORAL_TABLET | Freq: Two times a day (BID) | ORAL | 0 refills | Status: DC

## 2016-07-01 MED ORDER — OXYCODONE HCL 5 MG PO TABS: 5.0000 mg | ORAL_TABLET | ORAL | 0 refills | Status: DC | PRN

## 2016-07-01 MED ORDER — DOXYCYCLINE HYCLATE 100 MG PO TABS: 100.0000 mg | ORAL_TABLET | Freq: Two times a day (BID) | ORAL | 0 refills | Status: DC

## 2016-07-01 MED ORDER — HYDROCHLOROTHIAZIDE 25 MG PO TABS: 25.0000 mg | ORAL_TABLET | Freq: Every day | ORAL | 3 refills | Status: DC

## 2016-07-01 MED ORDER — BLOOD GLUCOSE MONITOR KIT: PACK | 0 refills | Status: DC

## 2016-07-01 MED ORDER — HYDRALAZINE HCL 20 MG/ML IJ SOLN
10.0000 mg | Freq: Once | INTRAMUSCULAR | Status: AC
  Administered 2016-07-01: 10 mg via INTRAVENOUS
  Filled 2016-07-01: qty 1

## 2016-07-01 NOTE — Discharge Instructions (Signed)
WOUND CARE: - dressing to be changed twice daily - supplies: sterile saline, kerlix, scissors, gauze, tape  - remove dressing and all packing carefully, moistening with sterile saline as needed to avoid packing/internal dressing sticking to the wound. - clean edges of skin around the wound with water/gauze, making sure there is no tape debris or leakage left on skin that could cause skin irritation or breakdown. - dampen and clean kerlix with sterile saline and pack wound from wound base to skin level, making sure to take note of any possible areas of wound tracking, tunneling and packing appropriately. Wound can be packed loosely. Trim kerlix to size. - cover wound with a dry gauze pad and secure with tape.  - change dressing as needed if leakage occurs, wound gets contaminated, or patient requests to shower. - patient may shower daily with wound open and following the shower the wound should be dried and a clean dressing placed.

## 2016-07-01 NOTE — Discharge Summary (Signed)
Physician Discharge Summary  ROMYN BOSWELL NWG:[id number] DOB: [birth year] DOA: 06/29/2016  PCP: Rosita Fire, MD  Admit date: 06/29/2016 Discharge date: 07/01/2016  Time spent: 30 minutes  Recommendations for Outpatient Follow-up:  1. Started metfromin this admit 2. Wound care as per gen surgery 3. Needs discussion with PCP re DM 4. Doxycycline Rx till 07/07/16  Discharge Diagnoses:  Principal Problem:   Cellulitis of abdominal wall Active Problems:   Abscess of abdominal wall   Hypertension   DM2 (diabetes mellitus, type 2) ([place])   Sepsis ([place])   Discharge Condition:  imporved  Diet recommendation:  hh low salt  Filed Weights   06/29/16 0152 06/29/16 1021  Weight: 113.4 kg (250 lb) 118.1 kg (260 lb 4.8 oz)    History of present illness:  56 y/o ? 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 CT showed this. General surgery consulted recommended medical admission Abscess drained in the [facility] Course:  Sepsis secondary to abscess status post I&D 06/29/16-resolved. GR + cocci /gram-negative rods Narrow to doxycycline 100 twice a day, discontinue Vanco/Zosyn. Outpatient follow-up with general surgery 1-2 weeks-pain management Percocet 30 tablets on discharge Diabetes mellitus-A1c 6.2-start metformin 500, needs education--need OP PCP follow up Hypertension-on clonidine as an outpatient. Poor choice of agent. 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 Qty: 12 tablet, Refills: 0    hydrochlorothiazide (HYDRODIURIL) 25 MG tablet Take 1 tablet (25 mg total) by mouth daily. Qty: 30 tablet, Refills: 3    metFORMIN (GLUCOPHAGE) 500 MG tablet Take 1 tablet (500 mg total) by mouth daily with breakfast. Qty: 30 tablet, Refills: 0    oxyCODONE (OXY IR/ROXICODONE) 5 MG immediate release tablet Take 1-2 tablets (5-10 mg total) by mouth every 4 (four) hours as needed for moderate pain. Qty: 30 tablet, Refills: 0      CONTINUE these medications which have CHANGED   Details  cloNIDine (CATAPRES) 0.1 MG tablet Take 1 tablet (0.1 mg total) by mouth 2 (two) times daily. Qty: 60 tablet, Refills: 11      STOP taking these medications     benzonatate (TESSALON) 100 MG capsule      ibuprofen (ADVIL,MOTRIN) 600 MG tablet        No Known Allergies Follow-up Information    [facility] Follow up on 07/24/2016.   Why:  Please keep this appointment on 3/29 at 2:00 PM.  Take photo ID, all medications Contact information: [place] [phone]           The results of significant diagnostics from this hospitalization (including imaging, microbiology, ancillary and laboratory) are listed below for reference.  Significant Diagnostic Studies: Ct Abdomen Pelvis W Contrast  Result Date: 06/29/2016 CLINICAL DATA:  Abdominal pain. Evaluate for abdominal wall abscess. EXAM: CT ABDOMEN AND PELVIS WITH CONTRAST TECHNIQUE: Multidetector CT imaging of the abdomen and pelvis was performed using the standard protocol following bolus administration of intravenous contrast. CONTRAST:  17mL ISOVUE-300 IOPAMIDOL  (ISOVUE-300) INJECTION 61% COMPARISON:  None. FINDINGS: Lower chest: No pulmonary nodules. No visible pleural or pericardial effusion. Hepatobiliary: Normal hepatic size and contours without focal liver lesion. No perihepatic ascites. No intra- or extrahepatic biliary dilatation. There is a calcified granuloma in the left hepatic lobe. Normal gallbladder. Pancreas: Normal pancreatic contours and enhancement. No peripancreatic fluid collection or pancreatic ductal dilatation. Spleen: Normal. Adrenals/Urinary Tract: Normal adrenal glands. There is a nonobstructing left renal calculus at the lower pole measuring 3 mm. The right kidney is normal. Stomach/Bowel: No abnormal bowel dilatation. No bowel wall thickening or adjacent fat stranding to indicate acute inflammation. No abdominal fluid collection. The appendix is absent. Vascular/Lymphatic: There is atherosclerotic calcification of the non aneurysmal abdominal aorta. No abdominal or pelvic adenopathy. Reproductive: The prostate is normal. Musculoskeletal: There is no bony spinal canal stenosis. No lytic or blastic lesions. There is lower lumbar osteophytosis. Other: Within the subcutaneous fat of the paracentral left lower quadrant, there is a intermediate attenuation collection measuring 4.7 x 3.0 x 2.8 cm with surrounding inflammatory stranding. IMPRESSION: 1. Intermediate attenuation collection within the subcutaneous fat of the left lower quadrant abdomen. The appearances is consistent with phlegmon or early abscess formation. 2. Nonobstructing left nephrolithiasis with 3 mm stone at the lower pole. 3. Aortic atherosclerosis. Electronically Signed   By: Ulyses Jarred M.D.   On: 06/29/2016 04:25   Dg Chest Port 1 View  Result Date: 06/29/2016 CLINICAL DATA:  Sepsis. EXAM: PORTABLE CHEST 1 VIEW COMPARISON:  Radiographs of August 21, 2015. FINDINGS: The heart size and mediastinal contours are within normal limits. Both lungs are clear. No pneumothorax or pleural  effusion is noted. The visualized skeletal structures are unremarkable. IMPRESSION: No acute cardiopulmonary abnormality seen. Electronically Signed   By: Marijo Conception, M.D.   On: 06/29/2016 12:22    Microbiology: Recent Results (from the past 240 hour(s))  Blood Culture (routine x 2)     Status: None (Preliminary result)   Collection Time: 06/29/16  4:35 AM  Result Value Ref Range Status   Specimen Description BLOOD RIGHT FA  Final   Special Requests BOTTLES DRAWN AEROBIC AND ANAEROBIC 5CC  Final   Culture   Final    NO GROWTH 1 DAY Performed at [facility], [address] [place], [place] [address]    Report Status PENDING  Incomplete  Blood Culture (routine x 2)     Status: None (Preliminary result)   Collection Time: 06/29/16  4:35 AM  Result Value Ref Range Status   Specimen Description BLOOD RIGHT HAND  Final   Special Requests BOTTLES DRAWN AEROBIC AND ANAEROBIC 5CC  Final   Culture   Final    NO GROWTH 1 DAY Performed at [facility], [place] [address] [place], [place] [address]    Report Status PENDING  Incomplete  MRSA PCR Screening     Status: None   Collection Time: 06/29/16 11:45 AM  Result Value Ref Range Status   MRSA by PCR NEGATIVE NEGATIVE Final    Comment:        The GeneXpert MRSA Assay (FDA approved for NASAL specimens only), is one component  of a comprehensive MRSA colonization surveillance program. It is not intended to diagnose MRSA infection nor to guide or monitor treatment for MRSA infections.   Aerobic/Anaerobic Culture (surgical/deep wound)     Status: None (Preliminary result)   Collection Time: 06/29/16  1:00 PM  Result Value Ref Range Status   Specimen Description ABDOMEN ABSCESS  Final   Special Requests NONE  Final   Gram Stain   Final    ABUNDANT WBC PRESENT, PREDOMINANTLY PMN ABUNDANT GRAM NEGATIVE RODS ABUNDANT GRAM POSITIVE COCCI IN CLUSTERS    Culture   Final    TOO YOUNG TO READ Performed at [facility], [place] [address] [place],  [address]    Report Status PENDING  Incomplete     Labs: Basic Metabolic Panel:  Recent Labs Lab 06/29/16 0152 06/29/16 1131 06/30/16 0507  NA 138 140 137  K 3.9 4.0 4.0  CL 106 110 107  CO2 18* 22 23  GLUCOSE 133* 109* 154*  BUN 16 14 20   CREATININE 1.24 1.13 1.26*  CALCIUM 9.0 9.0 8.1*   Liver Function Tests:  Recent Labs Lab 06/29/16 0152 06/29/16 1131 06/30/16 0507  AST 111* 106* 81*  ALT 49 51 40  ALKPHOS 54 58 50  BILITOT 0.8 1.0 0.7  PROT 8.0 8.1 6.8  ALBUMIN 3.4* 3.4* 2.7*    Recent Labs Lab 06/29/16 0152  LIPASE 45   No results for input(s): AMMONIA in the last 168 hours. CBC:  Recent Labs Lab 06/29/16 0152 06/29/16 1131 06/30/16 0507  WBC 13.3* 10.5 6.0  NEUTROABS  --  6.2  --   HGB 13.7 14.3 12.1*  HCT 39.2 40.9 36.1*  MCV 99.7 101.5* 102.3*  PLT 118* 124* 100*   Cardiac Enzymes:  Recent Labs Lab 06/29/16 1131 06/29/16 1924 06/29/16 2243  TROPONINI <0.03 <0.03 <0.03   BNP: BNP (last 3 results) No results for input(s): BNP in the last 8760 hours.  ProBNP (last 3 results) No results for input(s): PROBNP in the last 8760 hours.  CBG:  Recent Labs Lab 06/30/16 0831 06/30/16 1246 06/30/16 1713 06/30/16 2124 07/01/16 0723  GLUCAP 121* 106* 191* 131* 111*       Signed:  Nita Sells MD   [facility] 07/01/2016, 8:08 AM

## 2016-07-01 NOTE — Progress Notes (Signed)
Patient ID: Daniel [name], male   DOB: 29-Dec-1960, 56 y.o.   MRN: [id number]  [facility] Surgery Progress Note  2 Days Post-Op  Subjective: Doing well this morning. Pain mostly controlled with PO meds except during dressing changes he is still requiring morphine.  Objective: Vital signs in last 24 hours: Temp:  [97.8 F (36.6 C)-98.1 F (36.7 C)] 97.8 F (36.6 C) (03/06 0500) Pulse Rate:  [67-70] 70 (03/06 0500) Resp:  [17-18] 17 (03/06 0500) BP: (139-179)/(88-114) 161/93 (03/06 0633) SpO2:  [99 %-100 %] 99 % (03/06 0500) Last BM Date: 06/30/16  Intake/Output from previous day: 03/05 0701 - 03/06 0700 In: 2960 [P.O.:960; I.V.:1950; IV Piggyback:50] Out: 2300 [Urine:2300] Intake/Output this shift: Total I/O In: -  Out: 300 [Urine:300]  PE: Gen:  Alert, NAD, pleasant Pulm:  Effort normal Abd: Soft, ND, +BS, no HSM, lower abdominal abscess under pannus s/p I&D with no purulent drainage, tissue is beefy red with no signs of infection   Lab Results:   Recent Labs  06/29/16 1131 06/30/16 0507  WBC 10.5 6.0  HGB 14.3 12.1*  HCT 40.9 36.1*  PLT 124* 100*   BMET  Recent Labs  06/29/16 1131 06/30/16 0507 07/01/16 0659  NA 140 137  --   K 4.0 4.0  --   CL 110 107  --   CO2 22 23  --   GLUCOSE 109* 154*  --   BUN 14 20  --   CREATININE 1.13 1.26* 1.20  CALCIUM 9.0 8.1*  --    PT/INR  Recent Labs  06/29/16 1131  LABPROT 12.6  INR 0.94   CMP     Component Value Date/Time   NA 137 06/30/2016 0507   K 4.0 06/30/2016 0507   CL 107 06/30/2016 0507   CO2 23 06/30/2016 0507   GLUCOSE 154 (H) 06/30/2016 0507   BUN 20 06/30/2016 0507   CREATININE 1.20 07/01/2016 0659   CALCIUM 8.1 (L) 06/30/2016 0507   PROT 6.8 06/30/2016 0507   ALBUMIN 2.7 (L) 06/30/2016 0507   AST 81 (H) 06/30/2016 0507   ALT 40 06/30/2016 0507   ALKPHOS 50 06/30/2016 0507   BILITOT 0.7 06/30/2016 0507   GFRNONAA >60 07/01/2016 0659   GFRAA >60 07/01/2016 0659   Lipase      Component Value Date/Time   LIPASE 45 06/29/2016 0152       Studies/Results: Dg Chest Port 1 View  Result Date: 06/29/2016 CLINICAL DATA:  Sepsis. EXAM: PORTABLE CHEST 1 VIEW COMPARISON:  Radiographs of August 21, 2015. FINDINGS: The heart size and mediastinal contours are within normal limits. Both lungs are clear. No pneumothorax or pleural effusion is noted. The visualized skeletal structures are unremarkable. IMPRESSION: No acute cardiopulmonary abnormality seen. Electronically Signed   By: Marijo Conception, M.D.   On: 06/29/2016 12:22    Anti-infectives: Anti-infectives    Start     Dose/Rate Route Frequency Ordered Stop   06/30/16 1500  doxycycline (VIBRA-TABS) tablet 100 mg     100 mg Oral Every 12 hours 06/30/16 1337     06/30/16 0600  piperacillin-tazobactam (ZOSYN) 3.375 g in dextrose 5 % 50 mL IVPB  Status:  Discontinued     3.375 g 12.5 mL/hr over 240 Minutes Intravenous Every 8 hours 06/29/16 2357 06/30/16 1337   06/29/16 1400  piperacillin-tazobactam (ZOSYN) IVPB 3.375 g  Status:  Discontinued     3.375 g 12.5 mL/hr over 240 Minutes Intravenous Every 8 hours 06/29/16 0455 06/29/16  2357   06/29/16 1200  vancomycin (VANCOCIN) IVPB 1000 mg/200 mL premix  Status:  Discontinued     1,000 mg 200 mL/hr over 60 Minutes Intravenous Every 12 hours 06/29/16 0455 06/29/16 1127   06/29/16 1200  vancomycin (VANCOCIN) IVPB 750 mg/150 ml premix  Status:  Discontinued     750 mg 150 mL/hr over 60 Minutes Intravenous 2 times daily 06/29/16 1127 06/30/16 1337   06/29/16 0400  piperacillin-tazobactam (ZOSYN) IVPB 3.375 g     3.375 g 100 mL/hr over 30 Minutes Intravenous  Once 06/29/16 0347 06/29/16 0437   06/29/16 0400  vancomycin (VANCOCIN) IVPB 1000 mg/200 mL premix     1,000 mg 200 mL/hr over 60 Minutes Intravenous  Once 06/29/16 0347 06/29/16 0555       Assessment/Plan Abdominal wall abscess S/p INCISION AND DRAINAGE ABDOMINAL WALL ABSCESS 3/4 Dr. Lucia Gaskins - POD 2 - culture  pending, gram stain growing GRAM NEGATIVE RODS and [place]  DM - SSI. Continue working on better blood glucose control HTN Alcohol abuse - CIWA Tobacco abuse - nicotine patch  ID - zosyn/vanco 3/4>> FEN - carb modified diet VTE - lovenox  Plan - Wound healing well. Continue BID dressing changes and encourage daily shower with wound open. From surgical standpoint patient is nearly ready for discharge on PO antibiotics, once pain is controlled on PO meds.    LOS: 2 days    Jerrye Beavers , [facility] 07/01/2016, 9:04 AM Pager: [phone] Consults: [phone] Mon-Fri 7:00 am-4:30 pm Sat-Sun 7:00 am-11:30 am

## 2016-07-04 LAB — CULTURE, BLOOD (ROUTINE X 2)
CULTURE: NO GROWTH
CULTURE: NO GROWTH

## 2016-07-06 LAB — AEROBIC/ANAEROBIC CULTURE (SURGICAL/DEEP WOUND)

## 2016-07-06 LAB — AEROBIC/ANAEROBIC CULTURE W GRAM STAIN (SURGICAL/DEEP WOUND)

## 2016-07-10 ENCOUNTER — Ambulatory Visit: Payer: Self-pay | Admitting: Family Medicine

## 2016-07-16 ENCOUNTER — Telehealth: Payer: Self-pay

## 2016-07-16 NOTE — Telephone Encounter (Signed)
Nurse, "Patty" with [facility] called to report patients BP of 210/110 today at his visit. Patty states he is only taking HCTZ at home. I advised Patty we could get patient in asap (which was scheduled for Friday 07/18/2016 @2 :30pm). We would not be able to do anything over the phone because patient has not yet established care with our office. Patty states patient is not currently having any sob/ chest pain/ or headache but was advised to tell him if he develops any of these symptoms before his appointment, he should report to the nearest Emergency room. Thanks!

## 2016-07-18 ENCOUNTER — Ambulatory Visit: Payer: Self-pay | Admitting: Family Medicine

## 2016-07-24 ENCOUNTER — Ambulatory Visit: Payer: Self-pay | Admitting: Family Medicine

## 2016-08-04 ENCOUNTER — Ambulatory Visit: Payer: Self-pay | Admitting: Family Medicine

## 2016-09-21 ENCOUNTER — Emergency Department (HOSPITAL_COMMUNITY)
Admission: EM | Admit: 2016-09-21 | Discharge: 2016-09-21 | Disposition: A | Discharge status: Home / Self Care | Payer: Self-pay | Attending: Emergency Medicine | Admitting: Emergency Medicine

## 2016-09-21 ENCOUNTER — Encounter (HOSPITAL_COMMUNITY): Payer: Self-pay

## 2016-09-21 DIAGNOSIS — E119 Type 2 diabetes mellitus without complications: Secondary | ICD-10-CM | POA: Insufficient documentation

## 2016-09-21 DIAGNOSIS — M79672 Pain in left foot: Secondary | ICD-10-CM

## 2016-09-21 DIAGNOSIS — Z79899 Other long term (current) drug therapy: Secondary | ICD-10-CM | POA: Insufficient documentation

## 2016-09-21 DIAGNOSIS — M79671 Pain in right foot: Secondary | ICD-10-CM

## 2016-09-21 DIAGNOSIS — F17210 Nicotine dependence, cigarettes, uncomplicated: Secondary | ICD-10-CM | POA: Insufficient documentation

## 2016-09-21 DIAGNOSIS — B351 Tinea unguium: Secondary | ICD-10-CM

## 2016-09-21 DIAGNOSIS — Z7984 Long term (current) use of oral hypoglycemic drugs: Secondary | ICD-10-CM | POA: Insufficient documentation

## 2016-09-21 DIAGNOSIS — I10 Essential (primary) hypertension: Secondary | ICD-10-CM | POA: Insufficient documentation

## 2016-09-21 LAB — CBG MONITORING, ED: GLUCOSE-CAPILLARY: 98 mg/dL (ref 65–99)

## 2016-09-21 MED ORDER — CLOTRIMAZOLE 1 % EX CREA: TOPICAL_CREAM | CUTANEOUS | 0 refills | Status: DC

## 2016-09-21 NOTE — ED Provider Notes (Signed)
[facility] DEPT Provider Note   CSN: [id number] Arrival date & time: 09/21/16  1558  By signing my name below, I, Daniel [name], attest that this documentation has been prepared under the direction and in the presence of Daniel Reichert, MD. Electronically Signed: Mayer [name], Scribe. 09/21/16. 6:56 PM.   History   Chief Complaint No chief complaint on file.  The history is provided by the patient. No language interpreter was used.  HPI Comments: Daniel [name] is a 56 y.o. male with PMHx of DM2 who presents to the Emergency Department complaining of constant, gradually worsening bilateral foot pain for 2 weeks. He states his toenails are also falling off. He denies any injury or trauma to the area. He denies fever, SOB, nausea, vomiting, and abdominal pain. Pt is a dump Administrator for work. He does not see a podiatrist.  Past Medical History:  Diagnosis Date  . Diabetes mellitus without complication ([place])   . Hypertension   . Shoulder pain, left 06/28/2013    Patient Active Problem List   Diagnosis Date Noted  . Cellulitis of abdominal wall 06/29/2016  . Abscess of abdominal wall 06/29/2016  . Hypertension 06/29/2016  . DM2 (diabetes mellitus, type 2) ([place]) 06/29/2016  . Sepsis ([place]) 06/29/2016    Past Surgical History:  Procedure Laterality Date  . APPENDECTOMY    . COLONOSCOPY  01/23/2012   Procedure: COLONOSCOPY;  Surgeon: Danie Binder, MD;  Location: [facility] END[facility] SUITE;  Service: Endoscopy;  Laterality: N/A;  11:10 AM  . INCISION AND DRAINAGE ABSCESS N/A 06/29/2016   Procedure: INCISION AND DRAINAGE ABDOMINAL WALL ABSCESS;  Surgeon: Alphonsa Overall, MD;  Location: [facility] ORS;  Service: General;  Laterality: N/A;  . Left heel surgery         Home Medications    Prior to Admission medications   Medication Sig Start Date End Date Taking? Authorizing Provider  blood glucose meter kit and supplies KIT Dispense based on patient and insurance preference. Use up to four  times daily as directed. (FOR ICD-9 250.00, 250.01). 07/01/16   Nita Sells, MD  cloNIDine (CATAPRES) 0.1 MG tablet Take 1 tablet (0.1 mg total) by mouth 2 (two) times daily. 07/01/16   Nita Sells, MD  cloNIDine (CATAPRES) 0.2 MG tablet Take 0.5 tablets (0.1 mg total) by mouth 2 (two) times daily. 07/01/16   Nita Sells, MD  clotrimazole (LOTRIMIN) 1 % cream Apply to affected area 2 times daily 09/21/16   Daniel Reichert, MD  doxycycline (VIBRA-TABS) 100 MG tablet Take 1 tablet (100 mg total) by mouth every 12 (twelve) hours. 07/01/16   Nita Sells, MD  hydrochlorothiazide (HYDRODIURIL) 25 MG tablet Take 1 tablet (25 mg total) by mouth daily. 07/01/16   Nita Sells, MD  metFORMIN (GLUCOPHAGE) 500 MG tablet Take 1 tablet (500 mg total) by mouth daily with breakfast. 07/01/16   Nita Sells, MD  oxyCODONE (OXY IR/ROXICODONE) 5 MG immediate release tablet Take 1-2 tablets (5-10 mg total) by mouth every 4 (four) hours as needed for moderate pain. 07/01/16   Nita Sells, MD    Family History Family History  Problem Relation Age of Onset  . Colon cancer Neg Hx     Social History Social History  Substance Use Topics  . Smoking status: Current Every Day Smoker    Packs/day: 1.00    Years: 30.00    Types: Cigarettes  . Smokeless tobacco: Never Used  . Alcohol use 7.2 oz/week    12 Cans of beer per  week     Allergies   Patient has no known allergies.   Review of Systems Review of Systems  Constitutional: Negative for fever.  Respiratory: Negative for shortness of breath.   Gastrointestinal: Negative for abdominal pain, nausea and vomiting.  Musculoskeletal: Positive for arthralgias (foot pain bilaterally).  All other systems reviewed and are negative.    Physical Exam Updated Vital Signs BP (!) 169/105   Pulse 88   Temp 99.8 F (37.7 C) (Oral)   Resp 18   SpO2 97%   Physical Exam  Constitutional: He is oriented to person,  place, and time. He appears well-developed and well-nourished.  HENT:  Head: Normocephalic and atraumatic.  Cardiovascular: Normal rate and regular rhythm.   Pulmonary/Chest: Effort normal. No respiratory distress.  Abdominal: There is no tenderness.  Musculoskeletal:  2+ DP pulses bilaterally.  No edema to the foot. There is scaling to the soles of the feet bilaterally. Onychomycosis to the nails of the great toes bilaterally and nails are loose. There is no surrounding erythema or edema. There is tenderness to palpation on the soles of the feet bilaterally as well as on the great toes bilaterally.  Neurological: He is alert and oriented to person, place, and time.  Skin: Skin is warm and dry.  Psychiatric: He has a normal mood and affect. His behavior is normal.  Nursing note and vitals reviewed.    ED Treatments / Results  DIAGNOSTIC STUDIES: Oxygen Saturation is 97% on RA, normal by my interpretation.    COORDINATION OF CARE: 6:55 PM Discussed treatment plan with pt at bedside and pt agreed to plan. Labs (all labs ordered are listed, but only abnormal results are displayed) Labs Reviewed  CBG MONITORING, ED    EKG  EKG Interpretation None       Radiology No results found.  Procedures Procedures (including critical care time)  Medications Ordered in ED Medications - No data to display   Initial Impression / Assessment and Plan / ED Course  I have reviewed the triage vital signs and the nursing notes.  Pertinent labs & imaging results that were available during my care of the patient were reviewed by me and considered in my medical decision making (see chart for details).     Patient with history of diabetes here with bilateral foot pain for the last 2 weeks. He does have loose but not detached great toenails bilaterally. There is some scaling to the soles of the feeds concerning for tinea infection. No evidence of bacterial infection. Counseled patient on proper  shoes to avoid pressure on the nails. Will treat for fungal skin infection. Discussed the importance of podiatry follow-up for further nail evaluation and possible removal.  Final Clinical Impressions(s) / ED Diagnoses   Final diagnoses:  Onychomycosis  Foot pain, bilateral    New Prescriptions New Prescriptions   CLOTRIMAZOLE (LOTRIMIN) 1 % CREAM    Apply to affected area 2 times daily  I personally performed the services described in this documentation, which was scribed in my presence. The recorded information has been reviewed and is accurate.    Daniel Reichert, MD 09/21/16 1904

## 2016-09-21 NOTE — ED Notes (Signed)
Pt st's he has had bil foot pain for approx 2 weeks.  Pt is diabetic and has fungus to his toenails

## 2016-09-21 NOTE — ED Triage Notes (Signed)
Patient complains of bilateral foot pain x 2 weeks, denies trauma. States that he has both great toe nails loose. Alert and oriented, NAD

## 2016-09-27 NOTE — Addendum Note (Signed)
Addendum  created 09/27/16 1039 by Duane Boston, MD   Sign clinical note

## 2016-10-03 ENCOUNTER — Encounter: Payer: Self-pay | Admitting: Podiatry

## 2016-12-31 NOTE — Progress Notes (Signed)
This encounter was created in error - please disregard.

## 2017-05-20 ENCOUNTER — Inpatient Hospital Stay (HOSPITAL_COMMUNITY)
Admission: EM | Admit: 2017-05-20 | Discharge: 2017-05-25 | DRG: 065 | Disposition: A | Discharge status: Home Health | Payer: Self-pay | Attending: Neurology | Admitting: Neurology

## 2017-05-20 ENCOUNTER — Other Ambulatory Visit: Payer: Self-pay

## 2017-05-20 ENCOUNTER — Emergency Department (HOSPITAL_COMMUNITY): Payer: Self-pay

## 2017-05-20 ENCOUNTER — Encounter (HOSPITAL_COMMUNITY): Payer: Self-pay | Admitting: Emergency Medicine

## 2017-05-20 DIAGNOSIS — N401 Enlarged prostate with lower urinary tract symptoms: Secondary | ICD-10-CM | POA: Diagnosis present

## 2017-05-20 DIAGNOSIS — F1910 Other psychoactive substance abuse, uncomplicated: Secondary | ICD-10-CM | POA: Diagnosis present

## 2017-05-20 DIAGNOSIS — Z9114 Patient's other noncompliance with medication regimen: Secondary | ICD-10-CM

## 2017-05-20 DIAGNOSIS — F1410 Cocaine abuse, uncomplicated: Secondary | ICD-10-CM | POA: Diagnosis present

## 2017-05-20 DIAGNOSIS — I619 Nontraumatic intracerebral hemorrhage, unspecified: Secondary | ICD-10-CM

## 2017-05-20 DIAGNOSIS — Z6836 Body mass index (BMI) 36.0-36.9, adult: Secondary | ICD-10-CM

## 2017-05-20 DIAGNOSIS — R29702 NIHSS score 2: Secondary | ICD-10-CM | POA: Diagnosis present

## 2017-05-20 DIAGNOSIS — I610 Nontraumatic intracerebral hemorrhage in hemisphere, subcortical: Secondary | ICD-10-CM

## 2017-05-20 DIAGNOSIS — R338 Other retention of urine: Secondary | ICD-10-CM | POA: Diagnosis present

## 2017-05-20 DIAGNOSIS — E785 Hyperlipidemia, unspecified: Secondary | ICD-10-CM | POA: Diagnosis present

## 2017-05-20 DIAGNOSIS — G8191 Hemiplegia, unspecified affecting right dominant side: Secondary | ICD-10-CM | POA: Diagnosis present

## 2017-05-20 DIAGNOSIS — R7303 Prediabetes: Secondary | ICD-10-CM | POA: Diagnosis present

## 2017-05-20 DIAGNOSIS — E669 Obesity, unspecified: Secondary | ICD-10-CM | POA: Diagnosis present

## 2017-05-20 DIAGNOSIS — I618 Other nontraumatic intracerebral hemorrhage: Principal | ICD-10-CM | POA: Diagnosis present

## 2017-05-20 DIAGNOSIS — I159 Secondary hypertension, unspecified: Secondary | ICD-10-CM

## 2017-05-20 DIAGNOSIS — I161 Hypertensive emergency: Secondary | ICD-10-CM | POA: Diagnosis present

## 2017-05-20 DIAGNOSIS — N179 Acute kidney failure, unspecified: Secondary | ICD-10-CM | POA: Diagnosis present

## 2017-05-20 DIAGNOSIS — I615 Nontraumatic intracerebral hemorrhage, intraventricular: Secondary | ICD-10-CM | POA: Diagnosis present

## 2017-05-20 DIAGNOSIS — Z23 Encounter for immunization: Secondary | ICD-10-CM

## 2017-05-20 DIAGNOSIS — F17210 Nicotine dependence, cigarettes, uncomplicated: Secondary | ICD-10-CM | POA: Diagnosis present

## 2017-05-20 DIAGNOSIS — D696 Thrombocytopenia, unspecified: Secondary | ICD-10-CM | POA: Diagnosis present

## 2017-05-20 HISTORY — DX: Nontraumatic intracerebral hemorrhage, unspecified: I61.9

## 2017-05-20 LAB — CBC WITH DIFFERENTIAL/PLATELET
BASOS ABS: 0 10*3/uL (ref 0.0–0.1)
Basophils Relative: 0 %
EOS PCT: 1 %
Eosinophils Absolute: 0.1 10*3/uL (ref 0.0–0.7)
HCT: 39.3 % (ref 39.0–52.0)
Hemoglobin: 13.5 g/dL (ref 13.0–17.0)
LYMPHS PCT: 45 %
Lymphs Abs: 3.2 10*3/uL (ref 0.7–4.0)
MCH: 35.1 pg — ABNORMAL HIGH (ref 26.0–34.0)
MCHC: 34.4 g/dL (ref 30.0–36.0)
MCV: 102.1 fL — AB (ref 78.0–100.0)
Monocytes Absolute: 0.7 10*3/uL (ref 0.1–1.0)
Monocytes Relative: 10 %
NEUTROS ABS: 3.1 10*3/uL (ref 1.7–7.7)
Neutrophils Relative %: 44 %
PLATELETS: 103 10*3/uL — AB (ref 150–400)
RBC: 3.85 MIL/uL — ABNORMAL LOW (ref 4.22–5.81)
RDW: 12.8 % (ref 11.5–15.5)
WBC: 7 10*3/uL (ref 4.0–10.5)

## 2017-05-20 LAB — I-STAT TROPONIN, ED: TROPONIN I, POC: 0.03 ng/mL (ref 0.00–0.08)

## 2017-05-20 LAB — COMPREHENSIVE METABOLIC PANEL
ALT: 45 U/L (ref 17–63)
AST: 84 U/L — ABNORMAL HIGH (ref 15–41)
Albumin: 3 g/dL — ABNORMAL LOW (ref 3.5–5.0)
Alkaline Phosphatase: 48 U/L (ref 38–126)
Anion gap: 13 (ref 5–15)
BILIRUBIN TOTAL: 0.6 mg/dL (ref 0.3–1.2)
BUN: 16 mg/dL (ref 6–20)
CALCIUM: 9.5 mg/dL (ref 8.9–10.3)
CO2: 21 mmol/L — ABNORMAL LOW (ref 22–32)
Chloride: 105 mmol/L (ref 101–111)
Creatinine, Ser: 1.49 mg/dL — ABNORMAL HIGH (ref 0.61–1.24)
GFR calc Af Amer: 58 mL/min — ABNORMAL LOW (ref >60)
GFR calc non Af Amer: 50 mL/min — ABNORMAL LOW (ref >60)
Glucose, Bld: 122 mg/dL — ABNORMAL HIGH (ref 65–99)
POTASSIUM: 3.6 mmol/L (ref 3.5–5.1)
Sodium: 139 mmol/L (ref 135–145)
TOTAL PROTEIN: 7.4 g/dL (ref 6.5–8.1)

## 2017-05-20 LAB — I-STAT ARTERIAL BLOOD GAS, ED
ACID-BASE DEFICIT: 3 mmol/L — AB (ref 0.0–2.0)
BICARBONATE: 22 mmol/L (ref 20.0–28.0)
O2 SAT: 94 %
PO2 ART: 74 mmHg — AB (ref 83.0–108.0)
TCO2: 23 mmol/L (ref 22–32)
pCO2 arterial: 38.9 mmHg (ref 32.0–48.0)
pH, Arterial: 7.358 (ref 7.350–7.450)

## 2017-05-20 LAB — PROTIME-INR
INR: 0.92
PROTHROMBIN TIME: 12.3 s (ref 11.4–15.2)

## 2017-05-20 LAB — URINALYSIS, COMPLETE (UACMP) WITH MICROSCOPIC
BILIRUBIN URINE: NEGATIVE
Bacteria, UA: NONE SEEN
GLUCOSE, UA: NEGATIVE mg/dL
HGB URINE DIPSTICK: NEGATIVE
KETONES UR: NEGATIVE mg/dL
LEUKOCYTES UA: NEGATIVE
NITRITE: NEGATIVE
PH: 5 (ref 5.0–8.0)
PROTEIN: 30 mg/dL — AB
Specific Gravity, Urine: 1.005 (ref 1.005–1.030)

## 2017-05-20 LAB — RAPID URINE DRUG SCREEN, HOSP PERFORMED
Amphetamines: NOT DETECTED
BARBITURATES: NOT DETECTED
Benzodiazepines: NOT DETECTED
Cocaine: POSITIVE — AB
Opiates: NOT DETECTED
TETRAHYDROCANNABINOL: NOT DETECTED

## 2017-05-20 LAB — I-STAT CG4 LACTIC ACID, ED: Lactic Acid, Venous: 1.09 mmol/L (ref 0.5–1.9)

## 2017-05-20 MED ORDER — ACETAMINOPHEN 650 MG RE SUPP: 650.0000 mg | RECTAL | Status: DC | PRN

## 2017-05-20 MED ORDER — NICARDIPINE HCL IN NACL 20-0.86 MG/200ML-% IV SOLN
INTRAVENOUS | Status: AC
  Filled 2017-05-20: qty 200

## 2017-05-20 MED ORDER — NICARDIPINE HCL IN NACL 20-0.86 MG/200ML-% IV SOLN: 0.0000 mg/h | INTRAVENOUS | Status: DC

## 2017-05-20 MED ORDER — INSULIN ASPART 100 UNIT/ML ~~LOC~~ SOLN
0.0000 [IU] | Freq: Three times a day (TID) | SUBCUTANEOUS | Status: DC
  Administered 2017-05-21 (×2): 3 [IU] via SUBCUTANEOUS
  Administered 2017-05-22: 2 [IU] via SUBCUTANEOUS
  Administered 2017-05-23 (×2): 3 [IU] via SUBCUTANEOUS
  Administered 2017-05-23: 2 [IU] via SUBCUTANEOUS
  Administered 2017-05-24: 3 [IU] via SUBCUTANEOUS
  Administered 2017-05-24 – 2017-05-25 (×3): 2 [IU] via SUBCUTANEOUS
  Administered 2017-05-25: 3 [IU] via SUBCUTANEOUS

## 2017-05-20 MED ORDER — CLEVIDIPINE BUTYRATE 0.5 MG/ML IV EMUL
0.0000 mg/h | INTRAVENOUS | Status: DC
  Administered 2017-05-20: 8 mg/h via INTRAVENOUS
  Administered 2017-05-21: 17 mg/h via INTRAVENOUS
  Administered 2017-05-21 (×3): 21 mg/h via INTRAVENOUS
  Administered 2017-05-21: 13 mg/h via INTRAVENOUS
  Administered 2017-05-21 (×2): 21 mg/h via INTRAVENOUS
  Administered 2017-05-21: 12 mg/h via INTRAVENOUS
  Administered 2017-05-21: 21 mg/h via INTRAVENOUS
  Administered 2017-05-21: 17 mg/h via INTRAVENOUS
  Administered 2017-05-22: 1 mg/h via INTRAVENOUS
  Administered 2017-05-22: 21 mg/h via INTRAVENOUS
  Administered 2017-05-22: 18 mg/h via INTRAVENOUS
  Administered 2017-05-22 (×3): 21 mg/h via INTRAVENOUS
  Filled 2017-05-20 (×16): qty 50

## 2017-05-20 MED ORDER — ACETAMINOPHEN 160 MG/5ML PO SOLN: 650.0000 mg | ORAL | Status: DC | PRN

## 2017-05-20 MED ORDER — ACETAMINOPHEN 325 MG PO TABS
650.0000 mg | ORAL_TABLET | ORAL | Status: DC | PRN
  Administered 2017-05-23 – 2017-05-24 (×3): 650 mg via ORAL
  Filled 2017-05-20 (×3): qty 2

## 2017-05-20 MED ORDER — HYDRALAZINE HCL 20 MG/ML IJ SOLN
10.0000 mg | Freq: Once | INTRAMUSCULAR | Status: AC
  Administered 2017-05-20: 10 mg via INTRAVENOUS
  Filled 2017-05-20: qty 1

## 2017-05-20 MED ORDER — PANTOPRAZOLE SODIUM 40 MG IV SOLR
40.0000 mg | Freq: Every day | INTRAVENOUS | Status: DC
  Administered 2017-05-21 – 2017-05-22 (×2): 40 mg via INTRAVENOUS
  Filled 2017-05-20 (×2): qty 40

## 2017-05-20 MED ORDER — STROKE: EARLY STAGES OF RECOVERY BOOK
Freq: Once | Status: AC
  Administered 2017-05-21: 06:00:00
  Filled 2017-05-20: qty 1

## 2017-05-20 MED ORDER — SENNOSIDES-DOCUSATE SODIUM 8.6-50 MG PO TABS
1.0000 | ORAL_TABLET | Freq: Two times a day (BID) | ORAL | Status: DC
  Administered 2017-05-21 – 2017-05-25 (×9): 1 via ORAL
  Filled 2017-05-20 (×9): qty 1

## 2017-05-20 MED ORDER — NICARDIPINE HCL IN NACL 20-0.86 MG/200ML-% IV SOLN
3.0000 mg/h | Freq: Once | INTRAVENOUS | Status: AC
  Administered 2017-05-20: 5 mg/h via INTRAVENOUS

## 2017-05-20 NOTE — ED Notes (Signed)
Per dr verbal order once nurse is to get Cleviprex discontinue Cardene.

## 2017-05-20 NOTE — ED Provider Notes (Signed)
[facility] EMERGENCY DEPARTMENT Provider Note  CSN: [id number] Arrival date & time: 05/20/17 2022  Chief Complaint(s) No chief complaint on file.  Triage Note 2027 Per EMS: pt from home with c/o bilateral leg pain.  Pt ambulated to EMS truck and became altered.  Pt was complaining of pain but would not rate or describe. Pt also complained of generalized weakness and abdominal pain.  PTA vitals: BP 200/128, CBG 148, Sp02 94-95%, temp 100.7.    HPI Daniel [name] is a 57 y.o. male   The history is provided by the patient.  Altered Mental Status   This is a new problem. Episode onset: unknown. The problem has not changed since onset.Associated symptoms include confusion. Risk factors include alcohol intake. His past medical history is significant for diabetes and hypertension.   Remainder of history, ROS, and physical exam limited due to patient's condition (AMS). Additional information was obtained from EMS.   Level V Caveat.  Patient is having fluctuating complaints.  Initially stated that his legs were hurting then stated that his neck was hurting.  When question was repeated patient denied any neck pain or leg pain.  He endorses drinking 1-1/2 beers earlier today.  Denied any illicit drug use.  Past Medical History Past Medical History:  Diagnosis Date  . Diabetes mellitus without complication ([place])   . Hypertension   . Shoulder pain, left 06/28/2013   Patient Active Problem List   Diagnosis Date Noted  . Cellulitis of abdominal wall 06/29/2016  . Abscess of abdominal wall 06/29/2016  . Hypertension 06/29/2016  . DM2 (diabetes mellitus, type 2) ([place]) 06/29/2016  . Sepsis ([place]) 06/29/2016   Home Medication(s) Prior to Admission medications   Medication Sig Start Date End Date Taking? Authorizing Provider  blood glucose meter kit and supplies KIT Dispense based on patient and insurance preference. Use up to four times daily as directed. (FOR ICD-9  250.00, 250.01). 07/01/16   Nita Sells, MD  cloNIDine (CATAPRES) 0.1 MG tablet Take 1 tablet (0.1 mg total) by mouth 2 (two) times daily. 07/01/16   Nita Sells, MD  cloNIDine (CATAPRES) 0.2 MG tablet Take 0.5 tablets (0.1 mg total) by mouth 2 (two) times daily. 07/01/16   Nita Sells, MD  clotrimazole (LOTRIMIN) 1 % cream Apply to affected area 2 times daily 09/21/16   [name], MD  doxycycline (VIBRA-TABS) 100 MG tablet Take 1 tablet (100 mg total) by mouth every 12 (twelve) hours. 07/01/16   Nita Sells, MD  hydrochlorothiazide (HYDRODIURIL) 25 MG tablet Take 1 tablet (25 mg total) by mouth daily. 07/01/16   Nita Sells, MD  metFORMIN (GLUCOPHAGE) 500 MG tablet Take 1 tablet (500 mg total) by mouth daily with breakfast. 07/01/16   Nita Sells, MD  oxyCODONE (OXY IR/ROXICODONE) 5 MG immediate release tablet Take 1-2 tablets (5-10 mg total) by mouth every 4 (four) hours as needed for moderate pain. 07/01/16   Nita Sells, MD  Past Surgical History Past Surgical History:  Procedure Laterality Date  . APPENDECTOMY    . COLONOSCOPY  01/23/2012   Procedure: COLONOSCOPY;  Surgeon: Danie Binder, MD;  Location: [facility] ENDO SUITE;  Service: Endoscopy;  Laterality: N/A;  11:10 AM  . INCISION AND DRAINAGE ABSCESS N/A 06/29/2016   Procedure: INCISION AND DRAINAGE ABDOMINAL WALL ABSCESS;  Surgeon: Alphonsa Overall, MD;  Location: [facility] ORS;  Service: General;  Laterality: N/A;  . Left heel surgery     Family History Family History  Problem Relation Age of Onset  . Colon cancer Neg Hx     Social History Social History   Tobacco Use  . Smoking status: Current Every Day Smoker    Packs/day: 1.00    Years: 30.00    Pack years: 30.00    Types: Cigarettes  . Smokeless tobacco: Never Used  Substance Use Topics  . Alcohol use:  Yes    Alcohol/week: 7.2 oz    Types: 12 Cans of beer per week  . Drug use: Yes    Types: "Crack" cocaine   Allergies Patient has no known allergies.  Review of Systems Review of Systems  Unable to perform ROS: Mental status change  Psychiatric/Behavioral: Positive for confusion.    Physical Exam Vital Signs  I have reviewed the triage vital signs BP (!) 164/105   Pulse 85   Resp 19   SpO2 97%   Physical Exam  Constitutional: He is oriented to person, place, and time. He appears well-developed and well-nourished. No distress.  HENT:  Head: Normocephalic and atraumatic.  Nose: Nose normal.  Eyes: Conjunctivae and EOM are normal. Pupils are equal, round, and reactive to light. Right eye exhibits no discharge. Left eye exhibits no discharge. No scleral icterus.  Neck: Normal range of motion. Neck supple.  Cardiovascular: Normal rate and regular rhythm. Exam reveals no gallop and no friction rub.  No murmur heard. Pulmonary/Chest: Effort normal and breath sounds normal. No stridor. No respiratory distress. He has no rales.  Abdominal: Soft. He exhibits no distension. There is no tenderness.  Musculoskeletal: He exhibits no edema or tenderness.  Neurological: He is alert and oriented to person, place, and time.  Able to follow commands.  Moves all extremities with 5 out of 5 strength throughout  Skin: Skin is warm and dry. No rash noted. He is not diaphoretic. No erythema.  Psychiatric: He has a normal mood and affect.  Vitals reviewed.   ED Results and Treatments Labs (all labs ordered are listed, but only abnormal results are displayed) Labs Reviewed  CBC WITH DIFFERENTIAL/PLATELET - Abnormal; Notable for the following components:      Result Value   RBC 3.85 (*)    MCV 102.1 (*)    MCH 35.1 (*)    Platelets 103 (*)    All other components within normal limits  URINALYSIS, COMPLETE (UACMP) WITH MICROSCOPIC - Abnormal; Notable for the following components:   Color,  Urine STRAW (*)    Protein, ur 30 (*)    Squamous Epithelial / LPF 0-5 (*)    All other components within normal limits  I-STAT ARTERIAL BLOOD GAS, ED - Abnormal; Notable for the following components:   pO2, Arterial 74.0 (*)    Acid-base deficit 3.0 (*)    All other components within normal limits  PROTIME-INR  COMPREHENSIVE METABOLIC PANEL  AMMONIA  RAPID URINE DRUG SCREEN, HOSP PERFORMED  ETHANOL  I-STAT CG4 LACTIC ACID, ED  I-STAT TROPONIN, ED  CBG  MONITORING, ED                                                                                                                         EKG  EKG Interpretation  Date/Time:  Wednesday May 20 2017 20:39:20 EST Ventricular Rate:  86 PR Interval:    QRS Duration: 96 QT Interval:  419 QTC Calculation: 502 R Axis:   -30 Text Interpretation:  Sinus rhythm Left ventricular hypertrophy Prolonged QT interval No significant change since last tracing Confirmed by Addison Lank [phone]) on 05/20/2017 10:11:48 PM      Radiology Ct Head Wo Contrast  Result Date: 05/20/2017 CLINICAL DATA:  Altered level of consciousness, bilateral leg pain. Generalized weakness and abdominal pain. History of diabetes, hypertension. EXAM: CT HEAD WITHOUT CONTRAST TECHNIQUE: Contiguous axial images were obtained from the base of the skull through the vertex without intravenous contrast. COMPARISON:  None. FINDINGS: Brain: 1.4 x 1.4 x 3.0 cm (volume = 3.1 cm^3) acute LEFT thalamus intraparenchymal hematoma, no significant vasogenic edema. Intraventricular extension with blood products and bilateral lateral ventricles, third ventricle and a lesser extent fourth ventricle. No hydrocephalus. No acute large vascular territory infarct. No abnormal extra-axial fluid collections. Vascular: Mild calcific atherosclerosis carotid siphons. Skull: No skull fracture or destructive bony lesions. No significant scalp soft tissue swelling. Sinuses/Orbits: Trace paranasal sinus  mucosal thickening without air-fluid levels. Mastoid air cells are well aerated. Soft tissue within RIGHT external auditory canal most compatible with cerumen. Ocular globes and orbital contents are normal. Other: None. IMPRESSION: 1. Acute 1.4 x 1.4 x 3 cm LEFT thalamic hematoma (usually hypertensive etiology) with intraventricular extension. No hydrocephalus. No significant mass effect. 2. Critical Value/emergent results were called by telephone at the time of interpretation on 05/20/2017 at 10:34 pm to Dr. Addison Lank , who verbally acknowledged these results. Electronically Signed   By: Elon Alas M.D.   On: 05/20/2017 22:34   Pertinent labs & imaging results that were available during my care of the patient were reviewed by me and considered in my medical decision making (see chart for details).  Medications Ordered in ED Medications  nicardipine (CARDENE) 22m in 0.86% saline 2067mIV infusion (0.1 mg/ml) (not administered)  hydrALAZINE (APRESOLINE) injection 10 mg (10 mg Intravenous Given 05/20/17 2159)  Procedures Procedures CRITICAL CARE Performed by: Grayce Sessions [place] Ponder Total critical care time: 50 minutes Critical care time was exclusive of separately billable procedures and treating other patients. Critical care was necessary to treat or prevent imminent or life-threatening deterioration. Critical care was time spent personally by me on the following activities: development of treatment plan with patient and/or surrogate as well as nursing, discussions with consultants, evaluation of patient's response to treatment, examination of patient, obtaining history from patient or surrogate, ordering and performing treatments and interventions, ordering and review of laboratory studies, ordering and review of radiographic studies, pulse oximetry and  re-evaluation of patient's condition.   (including critical care time)  Medical Decision Making / ED Course I have reviewed the nursing notes for this encounter and the patient's prior records (if available in EHR or on provided paperwork).  Clinical Course as of May 20 2254  Wed May 20, 2017  2055 Patient appears to be confused.  Possible alcohol intoxication.  However patient initial blood pressure by EMS was significant only elevated with systolics greater than 637C and diastolics in the 588F.  Here his blood pressure is 180/110s.  Will obtain altered mental status workup including CT head to assess for possible ICH.  [PC]  2233 CT head with left-sided thalamic hemorrhagic stroke with intraventricular extension.  No hydrocephalus noted.  Patient started on nicardipine drip for aggressive blood pressure control.  Neurology consulted for admission to the neuro ICU and continued management.  [PC]  2256 Patient was evaluated by neurology and will be admitted for further management.  [PC]    Clinical Course User Index [PC] Purcell Jungbluth, Grayce Sessions, MD     Final Clinical Impression(s) / ED Diagnoses Final diagnoses:  Thalamic hemorrhage ([place])  Secondary hypertension      This chart was dictated using voice recognition software.  Despite best efforts to proofread,  errors can occur which can change the documentation meaning.   Fatima Blank, MD 05/20/17 2257

## 2017-05-20 NOTE — ED Triage Notes (Signed)
Per EMS: pt from home with c/o bilateral leg pain.  Pt ambulated to EMS truck and became altered.  Pt was complaining of pain but would not rate or describe. Pt also complained of generalized weakness and abdominal pain.  PTA vitals: BP 200/128, CBG 148, Sp02 94-95%, temp 100.7.

## 2017-05-20 NOTE — ED Notes (Addendum)
Patient transported to CT 

## 2017-05-20 NOTE — ED Notes (Signed)
NIH was done with neuro dr.

## 2017-05-20 NOTE — H&P (Signed)
Neurology H&P  CC: Leg weakness   History is obtained from:patient   HPI: Daniel [name] is a 57 y.o. male with a history of DM who reports he takes no medications who presents with right sided weakness that started today, though time is unclear. He feels like it may have been about midday. He complained of "Leg pain" at triage, but it was noted he was altered and there was some concern for EtOH. A head CT was obtained which demonstrates ICH.   LKW: this morning, unclear teim tpa given?: no, ICH ICH Score: 1  ROS: A 14 point ROS was performed and is negative except as noted in the HPI.   Past Medical History:  Diagnosis Date  . Diabetes mellitus without complication ([place])   . Hypertension   . Shoulder pain, left 06/28/2013     Family History  Problem Relation Age of Onset  . Colon cancer Neg Hx      Social History:  reports that he has been smoking cigarettes.  He has a 30.00 pack-year smoking history. he has never used smokeless tobacco. He reports that he drinks about 7.2 oz of alcohol per week. He reports that he uses drugs. Drug: "Crack" cocaine.   Exam: Current vital signs: BP (!) 168/101   Pulse 88   Resp (!) 29   SpO2 97%  Vital signs in last 24 hours: Pulse Rate:  [85-88] 88 (01/23 2300) Resp:  [19-29] 29 (01/23 2300) BP: (164-168)/(101-105) 168/101 (01/23 2300) SpO2:  [97 %] 97 % (01/23 2300)  Physical Exam  Constitutional: Appears obese Psych: Affect appropriate to situation Eyes: No scleral injection HENT: No OP obstrucion Head: Normocephalic.  Cardiovascular: Normal rate and regular rhythm.  Respiratory: Effort normal and breath sounds normal to anterior ascultation GI: Soft.  No distension. There is no tenderness.  Skin: WDI  Neuro: Mental Status: Patient is awake, alert, oriented to person, place, does not give correct year or age.  He has an increased latency of speech and appears mildly confused. Cranial Nerves: II: Visual Fields are full.  L pupil slightly larger than right, both are reactive.  III,IV, VI: L partial third nerve palsy, he has some movement in all directions, though limited medially.   V: Facial sensation is symmetric to temperature VII: Facial movement is mildly decreased on the right  VIII: hearing is intact to voice X: Uvula elevates symmetrically XI: Shoulder shrug is symmetric. XII: tongue is midline without atrophy or fasciculations.  Motor: He has a right hemiparesis, 4/5 in the right arm, 4-/5 in the right leg.  Sensory: Sensation is symmetric to light touch and temperature in the arms and legs. Deep Tendon Reflexes: 2+ and symmetric in the patellae.  Cerebellar: FNF slow on the right, but no definite ataxia.    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 He also has mild AKI.   Recommendations: 1) Admit to ICU 2) no antiplatelets or anticoagulants 3) blood pressure control with goal systolic 053 - 976 4) Frequent neuro checks 5) If symptoms worsen or there is decreased mental status, repeat stat head CT 6) PT,OT,ST 7) Elevated MCV - check B12    This patient is critically ill and at significant risk of neurological worsening,  death and care requires constant monitoring of vital signs, hemodynamics,respiratory and cardiac monitoring, neurological assessment, discussion with family, other specialists and medical decision making of high complexity. I spent 50 minutes of neurocritical care time  in the care of  this patient.  Roland Rack, MD [facility] [phone]  If 7pm- 7am, please page neurology on call as listed in IXL. 05/20/2017  11:05 PM

## 2017-05-21 ENCOUNTER — Inpatient Hospital Stay (HOSPITAL_COMMUNITY): Payer: Self-pay

## 2017-05-21 LAB — LIPID PANEL
CHOL/HDL RATIO: 3.6 ratio
Cholesterol: 140 mg/dL (ref 0–200)
HDL: 39 mg/dL — AB (ref >40)
LDL CALC: 50 mg/dL (ref 0–99)
TRIGLYCERIDES: 256 mg/dL — AB (ref <150)
VLDL: 51 mg/dL — ABNORMAL HIGH (ref 0–40)

## 2017-05-21 LAB — ETHANOL: Alcohol, Ethyl (B): <10 mg/dL (ref <10)

## 2017-05-21 LAB — HEMOGLOBIN A1C
HEMOGLOBIN A1C: 6.7 % — AB (ref 4.8–5.6)
Mean Plasma Glucose: 145.59 mg/dL

## 2017-05-21 LAB — GLUCOSE, CAPILLARY
GLUCOSE-CAPILLARY: 234 mg/dL — AB (ref 65–99)
Glucose-Capillary: 166 mg/dL — ABNORMAL HIGH (ref 65–99)
Glucose-Capillary: 151 mg/dL — ABNORMAL HIGH (ref 65–99)

## 2017-05-21 LAB — AMMONIA: Ammonia: 36 umol/L — ABNORMAL HIGH (ref 9–35)

## 2017-05-21 LAB — CBG MONITORING, ED: GLUCOSE-CAPILLARY: 141 mg/dL — AB (ref 65–99)

## 2017-05-21 LAB — MRSA PCR SCREENING: MRSA by PCR: NEGATIVE

## 2017-05-21 LAB — VITAMIN B12: Vitamin B-12: 244 pg/mL (ref 180–914)

## 2017-05-21 MED ORDER — LABETALOL HCL 5 MG/ML IV SOLN
10.0000 mg | INTRAVENOUS | Status: DC | PRN
  Administered 2017-05-21 – 2017-05-22 (×4): 10 mg via INTRAVENOUS
  Filled 2017-05-21: qty 4

## 2017-05-21 MED ORDER — HYDRALAZINE HCL 20 MG/ML IJ SOLN
10.0000 mg | Freq: Once | INTRAMUSCULAR | Status: AC
  Administered 2017-05-21: 10 mg via INTRAVENOUS
  Filled 2017-05-21: qty 1

## 2017-05-21 MED ORDER — CLONIDINE HCL 0.1 MG PO TABS: 0.1000 mg | ORAL_TABLET | Freq: Two times a day (BID) | ORAL | Status: DC

## 2017-05-21 MED ORDER — CLEVIDIPINE BUTYRATE 0.5 MG/ML IV EMUL
INTRAVENOUS | Status: AC
  Administered 2017-05-21: 12 mg/h via INTRAVENOUS
  Filled 2017-05-21: qty 50

## 2017-05-21 MED ORDER — CLONIDINE HCL 0.1 MG PO TABS
0.2000 mg | ORAL_TABLET | Freq: Two times a day (BID) | ORAL | Status: DC
  Administered 2017-05-21 – 2017-05-22 (×3): 0.2 mg via ORAL
  Filled 2017-05-21 (×3): qty 2

## 2017-05-21 MED ORDER — LABETALOL HCL 5 MG/ML IV SOLN
5.0000 mg | INTRAVENOUS | Status: DC | PRN
  Administered 2017-05-21 (×2): 5 mg via INTRAVENOUS
  Filled 2017-05-21: qty 4

## 2017-05-21 MED ORDER — LABETALOL HCL 5 MG/ML IV SOLN
INTRAVENOUS | Status: AC
  Filled 2017-05-21: qty 4

## 2017-05-21 NOTE — ED Notes (Signed)
Patient denies pain and is resting comfortably.  

## 2017-05-21 NOTE — Progress Notes (Signed)
NEUROHOSPITALISTS STROKE TEAM - DAILY PROGRESS NOTE   ADMISSION HISTORY: Daniel [name] is a 57 y.o. male with a history of DM who reports he takes no medications who presents with right sided weakness that started today, though time is unclear. He feels like it may have been about midday. He complained of "Leg pain" at triage, but it was noted he was altered and there was some concern for EtOH. A head CT was obtained which demonstrates ICH.  LKW: this morning, unclear time tpa given?: no, ICH ICH Score: 1  SUBJECTIVE (INTERVAL HISTORY)  is at the bedside. Patient is found laying in bed in NAD. Overall he feels his condition is unchanged Voices no new complaints. No new events reported overnight.   OBJECTIVE Lab Results: CBC:  Recent Labs  Lab 05/20/17 2134  WBC 7.0  HGB 13.5  HCT 39.3  MCV 102.1*  PLT 103*   BMP: Recent Labs  Lab 05/20/17 2134  NA 139  K 3.6  CL 105  CO2 21*  GLUCOSE 122*  BUN 16  CREATININE 1.49*  CALCIUM 9.5   Liver Function Tests:  Recent Labs  Lab 05/20/17 2134  AST 84*  ALT 45  ALKPHOS 48  BILITOT 0.6  PROT 7.4  ALBUMIN 3.0*   Coagulation Studies:  Recent Labs    05/20/17 2134  INR 0.92   Urinalysis:  Recent Labs  Lab 05/20/17 [place] 1.005  PHURINE 5.0  GLUCOSEU NEGATIVE  HGBUR NEGATIVE  BILIRUBINUR NEGATIVE  KETONESUR NEGATIVE  PROTEINUR 30*  NITRITE NEGATIVE  LEUKOCYTESUR NEGATIVE   Urine Drug Screen:     Component Value Date/Time   LABOPIA NONE DETECTED 05/20/2017 2206   COCAINSCRNUR POSITIVE (A) 05/20/2017 2206   LABBENZ NONE DETECTED 05/20/2017 2206   AMPHETMU NONE DETECTED 05/20/2017 2206   THCU NONE DETECTED 05/20/2017 2206   LABBARB NONE DETECTED 05/20/2017 2206    PHYSICAL EXAM Temp:  [98.5 F (36.9 C)-98.8 F (37.1 C)] 98.7 F (37.1 C) (01/24 0800) Pulse Rate:  [85-120] 92 (01/24 1000) Resp:   [12-29] 21 (01/24 1000) BP: (112-168)/(53-105) 133/83 (01/24 1000) SpO2:  [92 %-100 %] 92 % (01/24 1000) Weight:  [119 kg (262 lb 5.6 oz)] 119 kg (262 lb 5.6 oz) (01/24 0540) General - Well nourished, well developed, in no apparent distress HEENT-  Normocephalic,   Cardiovascular - Regular rate and rhythm  Respiratory - Lungs clear bilaterally. No wheezing. Abdomen - soft and non-tender, BS normal Extremities- no edema or cyanosis Neuro: Mental Status: Patient is awake, alert, oriented to person, place, does not give correct year or age.  He has an increased latency of speech and appears mildly confused. Cranial Nerves: II: Visual Fields are full. L pupil slightly larger than right, both are reactive.  III,IV, VI: L partial third nerve palsy, he has some movement in all directions, though limited medially.   V: Facial sensation is symmetric to temperature VII: Facial movement is mildly decreased on the right  VIII: hearing is intact to voice X: Uvula elevates symmetrically XI: Shoulder shrug is symmetric. XII: tongue is midline without atrophy or fasciculations.  Motor: He has a right hemiparesis, 4/5 in the right arm, 4-/5 in the right leg.  Sensory: Sensation is symmetric to light touch and temperature in the arms and legs. Deep Tendon Reflexes: 2+ and symmetric in the patellae.  Cerebellar: FNF slow on the right, but no definite ataxia.   IMAGING: I have personally  reviewed the radiological images below and agree with the radiology interpretations. Ct Head Wo Contrast Result Date: 05/20/2017 IMPRESSION: 1. Acute 1.4 x 1.4 x 3 cm LEFT thalamic hematoma (usually hypertensive etiology) with intraventricular extension. No hydrocephalus. No significant mass effect.      IMPRESSION: [name] is a 57 y.o. male with PMH of +cocaine use, HTN and DM, medication non-compliance who reports right sided weakness. Head CT demonstrates ICH.  Acute 1.4 x 1.4 x 3 cm LEFT  thalamic hematoma with intraventricular extension  Suspected Etiology: Hypertension and Cocaine use Resultant Symptoms: Right sided deficits Stroke Risk Factors: diabetes mellitus, hyperlipidemia and smoking Other Stroke Risk Factors: Advanced age, Cigarette smoker, Polysubstance Abuse Obesity, Body mass index is 36.59 kg/m.   Outstanding Stroke Work-up Studies:     Lipid Panel and HgbA1C  PLAN  05/21/2017: HOLD ASA  Repeat Head CT in next few days Restart Oral meds and titrate off of IV hypertensive medication If symptoms worsen or there is decreased mental status, repeat stat head CT Frequent neuro checks Telemetry monitoring Discontinue Bedrest orders - PT/OT/SLP Consult PM & Rehab Consult Case Management /MSW-  For PCP and medication assistance Ongoing aggressive stroke risk factor management Patient counseled to be compliant with his medications Patient counseled on Lifestyle modifications including, Diet, Exercise, and Stress Follow up with [facility] in 6 weeks, [place]: Acute on Chronic Kidney Disease with Mild AKI.  Admission Creatinine 1.49 Creatinine -  1.13 in March 2018 Gentle IVF's Repeat labs in AM  Elevated LFT's Will repeat labs after IVF hydration  Elevated MCV  Will check B12   Thrombocytopenia Will monitor trend Repeat labs in AM  HYPERTENSION: Stable SBP goal less than 120 - 140  Nicardipine drip, Labetolol PRN Long term BP goal normotensive. Home Meds: Catapres and HCTZ - Patient not taking per Admission Hx  HYPERLIPIDEMIA: No results found for: CHOL, TRIG, HDL, CHOLHDL, VLDL, LDLCALC-PENDING Home Meds:  NONE LDL  goal < 70 Start statin at discharge, if necessary Monitor LFT's - elevated on admission  PRE- DIABETES: 6.2 on 06/29/2016 HbgA1C - PENDING HgbA1c goal < 7.0 Currently on: Novolog Continue CBG monitoring and SSI to maintain glucose 140-180 mg/dl DM education   TOBACCO ABUSE &  POLYSUBSTANCE ABUSE UDS+ Current smoker Smoking cessation counseling provided Nicotine patch provided  OBESITY Obesity, Body mass index is 36.59 kg/m. Greater than/equal to 30  Other Active Problems: Active Problems:   ICH (intracerebral hemorrhage) [facility])    Hospital day # 1 VTE prophylaxis:  SCD's  Diet : Fall precautions Diet Heart Room service appropriate? Yes; Fluid consistency: Thin   FAMILY UPDATES: No family at bedside  TEAM UPDATES: Melvenia Beam, MD   Prior Home Stroke Medications:  No antithrombotic  Discharge Stroke Meds:  Please discharge patient on TBD   Disposition: 01-Home or Self Care Therapy Recs:               PENDING Follow Up:  Follow-up Information    Dennie Bible, NP. Schedule an appointment as soon as possible for a visit in 6 week(s).   Specialty:  Family Medicine Contact information: [address] [place] [address] [phone]          Patient, No Pcp Per -PCP Follow up in 1-2 weeks   Case Management aware of need    Attending Note:    05/21/2017 ASSESSMENT:    Stroke Neurology Team 05/21/2017 10:34 AM  Personally examined patient and images, and have participated in and made any corrections needed to history, physical, neuro exam,assessment and plan as stated above.  I have personally obtained the history, evaluated lab date, reviewed imaging studies and agree with radiology interpretations.    Sarina Ill, MD Stroke Neurology  To contact Stroke Continuity provider, please refer to [email or web address] After hours, contact General Neurology

## 2017-05-21 NOTE — Progress Notes (Signed)
Unable to control pt BP with current infusions and PRN medications. Neuro MD paged and MD will enter orders. CT head also ordered.

## 2017-05-21 NOTE — Progress Notes (Signed)
Patient's BP increasingly more difficult to control throughout shift. Multiple providers notified. Please refer to doc flowsheets for interventions regarding blood pressure management.

## 2017-05-21 NOTE — ED Notes (Signed)
Pt is sleeping and keeps on his side and has his arm bent. When straitening his arm the pressure are in the parameters the physician is asking for.

## 2017-05-21 NOTE — Progress Notes (Signed)
PT Cancellation Note  Patient Details Name: Daniel [name] MRN: [id number] DOB: Jan 05, 1961   Cancelled Treatment:    Reason Eval/Treat Not Completed: Medical issues which prohibited therapy(pt currently on bedrest and await increased activity order)   Yui Mulvaney B Maquita Sandoval 05/21/2017, 7:22 AM  Elwyn Reach, [place]

## 2017-05-22 LAB — GLUCOSE, CAPILLARY
GLUCOSE-CAPILLARY: 171 mg/dL — AB (ref 65–99)
Glucose-Capillary: 172 mg/dL — ABNORMAL HIGH (ref 65–99)
Glucose-Capillary: 145 mg/dL — ABNORMAL HIGH (ref 65–99)
Glucose-Capillary: 162 mg/dL — ABNORMAL HIGH (ref 65–99)

## 2017-05-22 LAB — BASIC METABOLIC PANEL
ANION GAP: 13 (ref 5–15)
BUN: 18 mg/dL (ref 6–20)
CHLORIDE: 103 mmol/L (ref 101–111)
CO2: 20 mmol/L — AB (ref 22–32)
CREATININE: 1.59 mg/dL — AB (ref 0.61–1.24)
Calcium: 9.2 mg/dL (ref 8.9–10.3)
GFR calc non Af Amer: 47 mL/min — ABNORMAL LOW (ref >60)
GFR, EST AFRICAN AMERICAN: 54 mL/min — AB (ref >60)
Glucose, Bld: 200 mg/dL — ABNORMAL HIGH (ref 65–99)
Potassium: 3.5 mmol/L (ref 3.5–5.1)
Sodium: 136 mmol/L (ref 135–145)

## 2017-05-22 LAB — CBC
HCT: 42.5 % (ref 39.0–52.0)
HEMOGLOBIN: 14.7 g/dL (ref 13.0–17.0)
MCH: 35.3 pg — ABNORMAL HIGH (ref 26.0–34.0)
MCHC: 34.6 g/dL (ref 30.0–36.0)
MCV: 101.9 fL — AB (ref 78.0–100.0)
PLATELETS: 123 10*3/uL — AB (ref 150–400)
RBC: 4.17 MIL/uL — AB (ref 4.22–5.81)
RDW: 13.2 % (ref 11.5–15.5)
WBC: 7.4 10*3/uL (ref 4.0–10.5)

## 2017-05-22 LAB — TRIGLYCERIDES: Triglycerides: 220 mg/dL — ABNORMAL HIGH (ref <150)

## 2017-05-22 MED ORDER — PANTOPRAZOLE SODIUM 40 MG PO TBEC
40.0000 mg | DELAYED_RELEASE_TABLET | Freq: Every day | ORAL | Status: DC
  Administered 2017-05-23 – 2017-05-25 (×3): 40 mg via ORAL
  Filled 2017-05-22 (×3): qty 1

## 2017-05-22 MED ORDER — AMLODIPINE BESYLATE 10 MG PO TABS
10.0000 mg | ORAL_TABLET | Freq: Every day | ORAL | Status: DC
  Administered 2017-05-23 – 2017-05-25 (×3): 10 mg via ORAL
  Filled 2017-05-22 (×3): qty 1

## 2017-05-22 MED ORDER — TAMSULOSIN HCL 0.4 MG PO CAPS
0.4000 mg | ORAL_CAPSULE | Freq: Every day | ORAL | Status: DC
  Administered 2017-05-22 – 2017-05-25 (×4): 0.4 mg via ORAL
  Filled 2017-05-22 (×4): qty 1

## 2017-05-22 MED ORDER — CLONIDINE HCL 0.1 MG PO TABS
0.3000 mg | ORAL_TABLET | Freq: Two times a day (BID) | ORAL | Status: DC
Start: 2017-05-22 — End: 2017-05-23
  Administered 2017-05-22: 0.3 mg via ORAL
  Filled 2017-05-22: qty 3

## 2017-05-22 MED ORDER — LABETALOL HCL 5 MG/ML IV SOLN
10.0000 mg | INTRAVENOUS | Status: DC | PRN
  Administered 2017-05-22 – 2017-05-23 (×4): 10 mg via INTRAVENOUS
  Filled 2017-05-22 (×4): qty 4

## 2017-05-22 MED ORDER — CLONIDINE HCL 0.1 MG PO TABS
0.1000 mg | ORAL_TABLET | Freq: Once | ORAL | Status: AC
  Administered 2017-05-22: 0.1 mg via ORAL
  Filled 2017-05-22: qty 1

## 2017-05-22 MED ORDER — POTASSIUM CHLORIDE IN NACL 20-0.9 MEQ/L-% IV SOLN
INTRAVENOUS | Status: DC
  Administered 2017-05-22: 16:00:00 via INTRAVENOUS
  Filled 2017-05-22 (×2): qty 1000

## 2017-05-22 MED ORDER — VITAMIN B-12 100 MCG PO TABS
500.0000 ug | ORAL_TABLET | Freq: Every day | ORAL | Status: DC
  Administered 2017-05-22 – 2017-05-25 (×4): 500 ug via ORAL
  Filled 2017-05-22: qty 5
  Filled 2017-05-22: qty 1
  Filled 2017-05-22: qty 5
  Filled 2017-05-22: qty 1

## 2017-05-22 MED ORDER — AMLODIPINE BESYLATE 5 MG PO TABS
5.0000 mg | ORAL_TABLET | Freq: Every day | ORAL | Status: DC
  Administered 2017-05-22: 5 mg via ORAL
  Filled 2017-05-22: qty 1

## 2017-05-22 NOTE — Evaluation (Signed)
Physical Therapy Evaluation Patient Details Name: Daniel [name] MRN: [id number] DOB: [birth year] Today's Date: 05/22/2017   History of Present Illness  57 yo admitted with L ICH with right weakness. PMHx: DM, HTN, HLD, cocaine use  Clinical Impression  Pt pleasant and able to mobilize well without notable weakness. Pt with balance, cognition and safety deficits who will benefit from acute therapy to maximize mobility, function and safety. Pt does not have 24hr care and currently unable to recall year, situation or how to call 911 and cannot mobilize independently. Recommend daily mobility with nursing staff with use of RW.     Follow Up Recommendations SNF;Supervision/Assistance - 24 hour    Equipment Recommendations  Rolling walker with 5" wheels    Recommendations for Other Services       Precautions / Restrictions Precautions Precautions: Fall Restrictions Weight Bearing Restrictions: No      Mobility  Bed Mobility Overal bed mobility: Modified Independent             General bed mobility comments: increased time to achieve  Transfers Overall transfer level: Needs assistance   Transfers: Sit to/from Stand Sit to Stand: Min guard         General transfer comment: cues for position and safety  Ambulation/Gait Ambulation/Gait assistance: Min assist Ambulation Distance (Feet): 150 Feet Assistive device: Rolling walker (2 wheeled) Gait Pattern/deviations: Step-through pattern;Wide base of support   Gait velocity interpretation: Below normal speed for age/gender General Gait Details: cues for position in RW, safety and direction. Attempted gait initially without RW with very unsteady, staggering gait with wide BOS  Stairs            Wheelchair Mobility    Modified Rankin (Stroke Patients Only) Modified Rankin (Stroke Patients Only) Pre-Morbid Rankin Score: No symptoms Modified Rankin: Slight disability     Balance Overall balance assessment:  Needs assistance   Sitting balance-Leahy Scale: Fair       Standing balance-Leahy Scale: Fair                               Pertinent Vitals/Pain Pain Assessment: No/denies pain    Home Living Family/patient expects to be discharged to:: Private residence Living Arrangements: Non-relatives/Friends Available Help at Discharge: Friend(s) Type of Home: Apartment Home Access: Stairs to enter Entrance Stairs-Rails: Left Entrance Stairs-Number of Steps: 10 Home Layout: One level Home Equipment: None      Prior Function Level of Independence: Independent         Comments: builds steps     Hand Dominance   Dominant Hand: Left    Extremity/Trunk Assessment   Upper Extremity Assessment Upper Extremity Assessment: Defer to OT evaluation    Lower Extremity Assessment Lower Extremity Assessment: Overall WFL for tasks assessed(4/5 bil hip flexion, 5/5 quad and hamstring bil)    Cervical / Trunk Assessment Cervical / Trunk Assessment: Other exceptions Cervical / Trunk Exceptions: forward head  Communication   Communication: No difficulties  Cognition Arousal/Alertness: Awake/alert Behavior During Therapy: WFL for tasks assessed/performed Overall Cognitive Status: Impaired/Different from baseline Area of Impairment: Orientation;Attention;Memory                 Orientation Level: Disoriented to;Time;Situation;Place Current Attention Level: Sustained Memory: Decreased short-term memory         General Comments: pt stating year as 2000, 2003, 2013, unable to recall year after education. Stating he enjoys going to dialysis (not an HD pt),  pt unable to state how to call 911      General Comments      Exercises     Assessment/Plan    PT Assessment Patient needs continued PT services  PT Problem List Decreased mobility;Decreased coordination;Decreased cognition;Decreased balance;Decreased knowledge of use of DME;Decreased activity  tolerance;Decreased safety awareness       PT Treatment Interventions Gait training;Therapeutic exercise;Patient/family education;Balance training;Stair training;Functional mobility training;Neuromuscular re-education;Therapeutic activities;DME instruction;Cognitive remediation    PT Goals (Current goals can be found in the Care Plan section)  Acute Rehab PT Goals Patient Stated Goal: return home PT Goal Formulation: With patient Time For Goal Achievement: 06/05/17 Potential to Achieve Goals: Fair    Frequency Min 3X/week   Barriers to discharge Decreased caregiver support      Co-evaluation PT/OT/SLP Co-Evaluation/Treatment: Yes Reason for Co-Treatment: Complexity of the patient's impairments (multi-system involvement) PT goals addressed during session: Mobility/safety with mobility         AM-PAC PT "6 Clicks" Daily Activity  Outcome Measure Difficulty turning over in bed (including adjusting bedclothes, sheets and blankets)?: A Little Difficulty moving from lying on back to sitting on the side of the bed? : A Little Difficulty sitting down on and standing up from a chair with arms (e.g., wheelchair, bedside commode, etc,.)?: A Little Help needed moving to and from a bed to chair (including a wheelchair)?: A Little Help needed walking in hospital room?: A Little Help needed climbing 3-5 steps with a railing? : A Little 6 Click Score: 18    End of Session Equipment Utilized During Treatment: Gait belt Activity Tolerance: Patient tolerated treatment well Patient left: in chair;with call bell/phone within reach;with chair alarm set Nurse Communication: Mobility status PT Visit Diagnosis: Other abnormalities of gait and mobility (R26.89);Other symptoms and signs involving the nervous system (R29.898)    Time:  -1013     Charges:   PT Evaluation $PT Eval Moderate Complexity: 1 Mod     PT G Codes:        Elwyn Reach, PT [phone]   [place] 05/22/2017, 10:15 AM

## 2017-05-22 NOTE — Care Management Note (Signed)
Case Management Note  Patient Details  Name: Daniel [name] MRN: [id number] Date of Birth: Jun 29, 1960  Subjective/Objective: Pt admitted on 05/20/17 with Lt ICH.  PTA, pt independent; resided at home with friends.                     Action/Plan: PT/OT recommending SNF for rehab at dc; CSW consulted to facilitate possible dc to SNF upon medical stability.    Expected Discharge Date:                  Expected Discharge Plan:  Skilled Nursing Facility  In-House Referral:  Clinical Social Work  Discharge planning Services  CM Consult  Post Acute Care Choice:    Choice offered to:     DME Arranged:    DME Agency:     HH Arranged:    [place] Agency:     Status of Service:  In process, will continue to follow  If discussed at Long Length of Stay Meetings, dates discussed:    Additional Comments:  Ella Bodo, RN 05/22/2017, 4:36 PM

## 2017-05-22 NOTE — Progress Notes (Addendum)
NEUROHOSPITALISTS STROKE TEAM - DAILY PROGRESS NOTE   ADMISSION HISTORY: Daniel [name] is a 57 y.o. male with a history of DM who reports he takes no medications who presents with right sided weakness that started today, though time is unclear. He feels like it may have been about midday. He complained of "Leg pain" at triage, but it was noted he was altered and there was some concern for EtOH. A head CT was obtained which demonstrates ICH.  LKW: this morning, unclear time tpa given?: no, ICH ICH Score: 1  SUBJECTIVE (INTERVAL HISTORY)  No family is at the bedside. Patient is found laying in bed in NAD. Overall he feels his condition is unchanged Voices no new complaints. No new events reported overnight. Per nursing was able to ambulate in hallway with PT this AM   OBJECTIVE Lab Results: CBC:  Recent Labs  Lab 05/20/17 2134 05/22/17 0253  WBC 7.0 7.4  HGB 13.5 14.7  HCT 39.3 42.5  MCV 102.1* 101.9*  PLT 103* 123*   BMP: Recent Labs  Lab 05/20/17 2134 05/22/17 0253  NA 139 136  K 3.6 3.5  CL 105 103  CO2 21* 20*  GLUCOSE 122* 200*  BUN 16 18  CREATININE 1.49* 1.59*  CALCIUM 9.5 9.2   Liver Function Tests:  Recent Labs  Lab 05/20/17 2134  AST 84*  ALT 45  ALKPHOS 48  BILITOT 0.6  PROT 7.4  ALBUMIN 3.0*   Coagulation Studies:  Recent Labs    05/20/17 2134  INR 0.92   Urinalysis:  Recent Labs  Lab 05/20/17 2206  COLORURINE STRAW*  APPEARANCEUR CLEAR  LABSPEC 1.005  PHURINE 5.0  GLUCOSEU NEGATIVE  HGBUR NEGATIVE  BILIRUBINUR NEGATIVE  KETONESUR NEGATIVE  PROTEINUR 30*  NITRITE NEGATIVE  LEUKOCYTESUR NEGATIVE   Urine Drug Screen:     Component Value Date/Time   LABOPIA NONE DETECTED 05/20/2017 2206   COCAINSCRNUR POSITIVE (A) 05/20/2017 2206   LABBENZ NONE DETECTED 05/20/2017 2206   AMPHETMU NONE DETECTED 05/20/2017 2206   THCU NONE DETECTED 05/20/2017 2206   LABBARB NONE DETECTED  05/20/2017 2206    PHYSICAL EXAM Temp:  [97.9 F (36.6 C)-99.1 F (37.3 C)] 97.9 F (36.6 C) (01/25 1200) Pulse Rate:  [73-106] 77 (01/25 1200) Resp:  [7-27] 17 (01/25 1200) BP: (95-169)/(56-97) 125/59 (01/25 1200) SpO2:  [91 %-96 %] 92 % (01/25 1200) General - Well nourished, well developed, in no apparent distress HEENT-  Normocephalic,   Cardiovascular - Regular rate and rhythm  Respiratory - Lungs clear bilaterally. No wheezing. Abdomen - soft and non-tender, BS normal Extremities- no edema or cyanosis Neuro: Mental Status: Patient is awake, alert, oriented to person, place, does not give correct year or age.  He has an increased latency of speech and appears mildly confused. Cranial Nerves: II: Visual Fields are full. L pupil slightly larger than right, both are reactive.  III,IV, VI: L partial third nerve palsy, he has some movement in all directions, though limited medially.   V: Facial sensation is symmetric to temperature VII: Facial movement is mildly decreased on the right  VIII: hearing is intact to voice X: Uvula elevates symmetrically XI: Shoulder shrug is symmetric. XII: tongue is midline without atrophy or fasciculations.  Motor: He has a right hemiparesis, 4/5 in the right arm, 4-/5 in the right leg.  Sensory: Sensation is symmetric to light touch and temperature in the arms and legs. Deep Tendon Reflexes: 2+ and symmetric in the patellae.  Cerebellar:  FNF slow on the right, but no definite ataxia.   IMAGING: I have personally reviewed the radiological images below and agree with the radiology interpretations. Ct Head Wo Contrast Result Date: 05/20/2017 IMPRESSION: 1. Acute 1.4 x 1.4 x 3 cm LEFT thalamic hematoma (usually hypertensive etiology) with intraventricular extension. No hydrocephalus. No significant mass effect.   Ct Head Wo Contrast Result Date: 05/21/2017 IMPRESSION: Unchanged appearance of left thalamic intraparenchymal hematoma  with unchanged intraventricular extension.    IMPRESSION: [name] is a 57 y.o. male with PMH of +cocaine use, HTN and DM, medication non-compliance who reports right sided weakness. Head CT demonstrates ICH.  Acute 1.4 x 1.4 x 3 cm LEFT thalamic hematoma with intraventricular extension  Suspected Etiology: Hypertension and Cocaine use Resultant Symptoms: Right sided deficits Stroke Risk Factors: diabetes mellitus, hyperlipidemia and smoking Other Stroke Risk Factors: Advanced age, Cigarette smoker, Polysubstance Abuse Obesity, Body mass index is 36.59 kg/m.   Outstanding Stroke Work-up Studies:    Work up completed at this time  PLAN  05/22/2017: HOLD ASA  Restart Oral meds and titrate off of IV hypertensive medication If symptoms worsen or there is decreased mental status, repeat stat head CT Frequent neuro checks Telemetry monitoring PT/OT/SLP Consult PM & Rehab Consult Case Management /MSW-  For PCP and medication assistance Ongoing aggressive stroke risk factor management Patient counseled to be compliant with his medications Patient counseled on Lifestyle modifications including, Diet, Exercise, and Stress Follow up with [facility] in 6 weeks, [place]: Acute on Chronic Kidney Disease with Mild AKI.  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 Home Meds: Catapres and HCTZ - Patient not taking per Admission  Hx  HYPERLIPIDEMIA:    Component Value Date/Time   CHOL 140 05/21/2017 0632   TRIG 220 (H) 05/22/2017 0253   HDL 39 (L) 05/21/2017 0632   CHOLHDL 3.6 05/21/2017 0632   VLDL 51 (H) 05/21/2017 0632   LDLCALC 50 05/21/2017 0981  -PENDING Home Meds:  NONE LDL  goal < 70, LDL 50,  Start statin at discharge, if necessary Monitor LFT's - elevated on admission  PRE- DIABETES: HbgA1C - 6.7 HgbA1c goal < 7.0 Currently on: Novolog Continue CBG monitoring and SSI to maintain glucose 140-180 mg/dl DM education   TOBACCO ABUSE & POLYSUBSTANCE ABUSE UDS+ Current smoker Smoking cessation counseling provided Nicotine patch provided  OBESITY Obesity, Body mass index is 36.59 kg/m. Greater than/equal to 30  Other Active Problems: Active Problems:   ICH (intracerebral hemorrhage) [facility])    Hospital day # 2 VTE prophylaxis:  SCD's  Diet : Fall precautions Diet Heart Room service appropriate? Yes; Fluid consistency: Thin   FAMILY UPDATES: No family at bedside  TEAM UPDATES: Melvenia Beam, MD   Prior Home Stroke Medications:  No antithrombotic  Discharge Stroke Meds:  Please discharge patient on TBD   Disposition: 01-Home or Self Care Therapy Recs:               SNF Follow  Up:  Follow-up Information    Dennie Bible, NP. Schedule an appointment as soon as possible for a visit in 6 week(s).   Specialty:  Family Medicine Contact information: [address] [place] [address] [phone]          Patient, No Pcp Per -PCP Follow up in 1-2 weeks   Case Management aware of need    Renie Ora Stroke Neurology Team 05/22/2017 1:44 PM  To contact Stroke Continuity provider, please refer to [email or web address] After hours, contact General Neurology

## 2017-05-22 NOTE — Evaluation (Signed)
Occupational Therapy Evaluation Patient Details Name: Daniel [name] MRN: [id number] DOB: [birth year] Today's Date: 05/22/2017    History of Present Illness 57 yo admitted with L ICH with right weakness. 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 He will benefit from acute OT with follow up at SNF unless he has 24 hour care at home then he could go home.    Follow Up Recommendations  SNF;Supervision/Assistance - 24 hour    Equipment Recommendations  Other (comment)(TBD at next venue)       Precautions / Restrictions Precautions Precautions: Fall Restrictions Weight Bearing Restrictions: No      Mobility Bed Mobility Overal bed mobility: Modified Independent             General bed mobility comments: increased time to achieve  Transfers Overall transfer level: Needs assistance   Transfers: Sit to/from Stand Sit to Stand: Min guard         General transfer comment: cues for position and safety    Balance Overall balance assessment: Needs assistance   Sitting balance-Leahy Scale: Fair       Standing balance-Leahy Scale: Fair                             ADL either performed or assessed with clinical judgement   ADL Overall ADL's : Needs assistance/impaired Eating/Feeding: Independent;Sitting   Grooming: Set up;Supervision/safety;Sitting   Upper Body Bathing: Set up;Supervision/ safety;Sitting   Lower Body Bathing: Sit to/from stand;Min guard   Upper Body Dressing : Set up;Supervision/safety;Sitting   Lower Body Dressing: Sit to/from stand;Min guard Lower Body Dressing Details (indicate cue type and reason): struggles with socks but can do them with  increased time Toilet Transfer: Minimal assistance;Ambulation;RW   Toileting- Water quality scientist and Hygiene: Min guard;Sit to/from stand               Vision Baseline Vision/History: No visual deficits Patient Visual Report: No change from baseline Vision Assessment?: Yes Eye Alignment: Within Functional Limits Ocular Range of Motion: Within Functional Limits Alignment/Gaze Preference: Within Defined Limits Tracking/Visual Pursuits: Able to track stimulus in all quads without difficulty Saccades: Decreased speed of saccadic movement;Additional eye shifts occurred during testing;Other (comment)(needed VCs to keep looking back and forth between the 2 objects) Visual Fields: No apparent deficits            Pertinent Vitals/Pain Pain Assessment: No/denies pain     Hand Dominance Left   Extremity/Trunk Assessment Upper Extremity Assessment Upper Extremity Assessment: Overall WFL for tasks assessed   Lower Extremity Assessment Lower Extremity Assessment: Overall WFL for tasks assessed(4/5 bil hip flexion, 5/5 quad and hamstring bil)   Cervical / Trunk Assessment Cervical / Trunk Assessment: Other exceptions Cervical / Trunk Exceptions: forward head   Communication Communication Communication: No difficulties   Cognition Arousal/Alertness: Awake/alert Behavior During Therapy: WFL for tasks assessed/performed Overall Cognitive Status: Difficult to assess Area of Impairment: Orientation;Attention;Memory                 Orientation Level: Disoriented to;Time;Situation;Place Current Attention Level: Sustained Memory: Decreased short-term memory         General Comments: pt stating year as 2000, 2003, 2013, unable to recall  year after education. Stating he enjoys going to dialysis (not an HD pt), pt unable to state how to call [place] expects to be discharged to:: Private residence Living Arrangements:  Non-relatives/Friends Available Help at Discharge: Friend(s);Available PRN/intermittently Type of Home: Apartment Home Access: Stairs to enter Entrance Stairs-Number of Steps: 10 Entrance Stairs-Rails: Left Home Layout: One level     Bathroom Shower/Tub: Teacher, early years/pre: Standard     Home Equipment: None          Prior Functioning/Environment Level of Independence: Independent        Comments: builds steps        OT Problem List: Impaired balance (sitting and/or standing);Decreased cognition;Decreased safety awareness      OT Treatment/Interventions: Self-care/ADL training;Balance training;Cognitive remediation/compensation;DME and/or AE instruction;Patient/family education;Therapeutic activities    OT Goals(Current goals can be found in the care plan section) Acute Rehab OT Goals Patient Stated Goal: return home OT Goal Formulation: With patient Time For Goal Achievement: 06/05/17 Potential to Achieve Goals: Good  OT Frequency: Min 3X/week   Barriers to D/C: Decreased caregiver support          Co-evaluation PT/OT/SLP Co-Evaluation/Treatment: Yes Reason for Co-Treatment: Complexity of the patient's impairments (multi-system involvement) PT goals addressed during session: Mobility/safety with mobility OT goals addressed during session: ADL's and self-care;Strengthening/ROM      AM-PAC PT "6 Clicks" Daily Activity     Outcome Measure Help from another person eating meals?: None Help from another person taking care of personal grooming?: A Little Help from another person toileting, which includes using toliet, bedpan, or urinal?: A Little Help from another person bathing (including washing, rinsing, drying)?: A Little Help from another person to put on and taking off regular upper body clothing?: A Little Help from another person to put on and taking off regular lower body clothing?: A Little 6 Click Score: 19   End of Session Equipment  Utilized During Treatment: Gait belt;Rolling walker Nurse Communication: Mobility status  Activity Tolerance: Patient tolerated treatment well Patient left: in chair;with call bell/phone within reach;with chair alarm set  OT Visit Diagnosis: Unsteadiness on feet (R26.81);Other abnormalities of gait and mobility (R26.89);Cognitive communication deficit (R41.841) Symptoms and signs involving cognitive functions: Nontraumatic intracerebral hemorrhage                Time: 2694-8546 OT Time Calculation (min): 26 min Charges:  OT General Charges $OT Visit: 1 Visit OT Evaluation $OT Eval Moderate Complexity: [address], [place] [phone] 05/22/2017

## 2017-05-22 NOTE — Progress Notes (Signed)
Pt with 909 mls on bladder scan. Neuro MD paged and order for I/O cath X1 received.

## 2017-05-22 NOTE — Evaluation (Signed)
Speech Language Pathology Evaluation Patient Details Name: Daniel [name] MRN: [id number] DOB: [birth year] Today's Date: 05/22/2017 Time: 7858-8502 SLP Time Calculation (min) (ACUTE ONLY): 17 min  Problem List:  Patient Active Problem List   Diagnosis Date Noted  . ICH (intracerebral hemorrhage) ([place]) 05/20/2017  . Cellulitis of abdominal wall 06/29/2016  . Abscess of abdominal wall 06/29/2016  . Hypertension 06/29/2016  . DM2 (diabetes mellitus, type 2) ([place]) 06/29/2016  . Sepsis ([place]) 06/29/2016   Past Medical History:  Past Medical History:  Diagnosis Date  . Diabetes mellitus without complication ([place])   . Hypertension   . Shoulder pain, left 06/28/2013   Past Surgical History:  Past Surgical History:  Procedure Laterality Date  . APPENDECTOMY    . COLONOSCOPY  01/23/2012   Procedure: COLONOSCOPY;  Surgeon: Danie Binder, MD;  Location: [facility];  Service: Endoscopy;  Laterality: N/A;  11:10 AM  . INCISION AND DRAINAGE ABSCESS N/A 06/29/2016   Procedure: INCISION AND DRAINAGE ABDOMINAL WALL ABSCESS;  Surgeon: Alphonsa Overall, MD;  Location: [facility];  Service: General;  Laterality: N/A;  . Left heel surgery     HPI:  57 yo admitted with L ICH with right weakness. PMHx: DM, HTN, HLD, cocaine use.  DX:AJOIN 1.4 x 1.4 x 3 cm LEFT thalamic hematoma (usually hypertensive etiology) with intraventricular extension. No hydrocephalus. No significant mass effect.    Assessment / Plan / Recommendation Clinical Impression  Pt with moderate cognitive-linguistic deficits with limited spontaneous output, decreased initiation.  Speech is mildly dysarthric.  Affect flat. Yes/no reliability for factual questions (e.g., can you use a hammer to pound nails) is 25%.  Responsive/confrontational naming are [facility]; generative naming tasks are significantly impaired (e.g., category naming of animals - "rat, cheese, secret animals"). Poor problem solving, apathy present.  Pt will need 24 hour  supervision upon D/C.  SLP will follow acutely to address basic cognitive-communication.      SLP Assessment  SLP Recommendation/Assessment: Patient needs continued Speech Lanaguage Pathology Services SLP Visit Diagnosis: Cognitive communication deficit (R41.841)    Follow Up Recommendations  Skilled Nursing facility    Frequency and Duration min 2x/week  1 week      SLP Evaluation Cognition  Overall Cognitive Status: Impaired/Different from baseline Arousal/Alertness: Awake/alert Orientation Level: Oriented to person;Disoriented to place;Disoriented to time;Disoriented to situation Attention: Sustained Sustained Attention: Appears intact Awareness: Impaired Problem Solving: Impaired Problem Solving Impairment: Verbal basic;Functional basic       Comprehension  Auditory Comprehension Overall Auditory Comprehension: Impaired Yes/No Questions: Impaired Complex Questions: 0-24% accurate Commands: Impaired Multistep Basic Commands: 50-74% accurate Visual Recognition/Discrimination Discrimination: Not tested Reading Comprehension Reading Status: Not tested    Expression Expression Primary Mode of Expression: Verbal Verbal Expression Overall Verbal Expression: Impaired Initiation: Impaired Level of Generative/Spontaneous Verbalization: Sentence Repetition: No impairment Naming: Impairment Responsive: 76-100% accurate Confrontation: Within functional limits Divergent: 0-24% accurate Verbal Errors: Perseveration   Oral / Motor  Oral Motor/Sensory Function Overall Oral Motor/Sensory Function: Mild impairment Facial Symmetry: Abnormal symmetry right;Suspected CN VII (facial) dysfunction Motor Speech Overall Motor Speech: Impaired Respiration: Within functional limits Phonation: Normal Resonance: Within functional limits Articulation: Impaired Level of Impairment: Sentence Intelligibility: Intelligibility reduced Conversation: 75-100% accurate Motor Planning: Witnin  functional limits   GO                   Daniel [name], [place] CCC/SLP Pager [phone]  Daniel [name] 05/22/2017, 3:02 PM

## 2017-05-23 ENCOUNTER — Inpatient Hospital Stay (HOSPITAL_COMMUNITY): Payer: Self-pay

## 2017-05-23 ENCOUNTER — Other Ambulatory Visit: Payer: Self-pay

## 2017-05-23 DIAGNOSIS — I340 Nonrheumatic mitral (valve) insufficiency: Secondary | ICD-10-CM

## 2017-05-23 DIAGNOSIS — N182 Chronic kidney disease, stage 2 (mild): Secondary | ICD-10-CM

## 2017-05-23 LAB — BASIC METABOLIC PANEL
ANION GAP: 9 (ref 5–15)
BUN: 15 mg/dL (ref 6–20)
CALCIUM: 8.9 mg/dL (ref 8.9–10.3)
CO2: 22 mmol/L (ref 22–32)
Chloride: 107 mmol/L (ref 101–111)
Creatinine, Ser: 1.4 mg/dL — ABNORMAL HIGH (ref 0.61–1.24)
GFR calc Af Amer: >60 mL/min (ref >60)
GFR calc non Af Amer: 54 mL/min — ABNORMAL LOW (ref >60)
GLUCOSE: 155 mg/dL — AB (ref 65–99)
Potassium: 4 mmol/L (ref 3.5–5.1)
Sodium: 138 mmol/L (ref 135–145)

## 2017-05-23 LAB — GLUCOSE, CAPILLARY
GLUCOSE-CAPILLARY: 158 mg/dL — AB (ref 65–99)
GLUCOSE-CAPILLARY: 130 mg/dL — AB (ref 65–99)
Glucose-Capillary: 171 mg/dL — ABNORMAL HIGH (ref 65–99)
Glucose-Capillary: 131 mg/dL — ABNORMAL HIGH (ref 65–99)

## 2017-05-23 LAB — ECHOCARDIOGRAM COMPLETE
Height: 71 in
Weight: 4197.56 oz

## 2017-05-23 MED ORDER — IOPAMIDOL (ISOVUE-370) INJECTION 76%
INTRAVENOUS | Status: AC
  Administered 2017-05-23: 50 mL
  Filled 2017-05-23: qty 50

## 2017-05-23 MED ORDER — CLONIDINE HCL 0.1 MG PO TABS
0.2000 mg | ORAL_TABLET | Freq: Three times a day (TID) | ORAL | Status: DC
  Administered 2017-05-23 – 2017-05-25 (×7): 0.2 mg via ORAL
  Filled 2017-05-23 (×7): qty 2

## 2017-05-23 MED ORDER — HYDRALAZINE HCL 25 MG PO TABS
25.0000 mg | ORAL_TABLET | Freq: Three times a day (TID) | ORAL | Status: DC
  Administered 2017-05-23 – 2017-05-24 (×4): 25 mg via ORAL
  Filled 2017-05-23 (×4): qty 1

## 2017-05-23 MED ORDER — PNEUMOCOCCAL VAC POLYVALENT 25 MCG/0.5ML IJ INJ
0.5000 mL | INJECTION | INTRAMUSCULAR | Status: AC
  Administered 2017-05-24: 0.5 mL via INTRAMUSCULAR
  Filled 2017-05-23: qty 0.5

## 2017-05-23 NOTE — [place] FL2 (Signed)
[place] MEDICAID FL2 LEVEL OF CARE SCREENING TOOL     IDENTIFICATION  Patient Name: Daniel [name] Birthdate: 14-Sep-1960 Sex: male Admission Date (Current Location): 05/20/2017  [facility] and [place] Number:  [place] (Pending) Facility and Address:  [facility] [address], [place], [place] [address]      Provider Number: [id number]  Attending Physician Name and Address:  Rosalin Hawking, MD  Relative Name and Phone Number:  [name] [phone]    Current Level of Care: Hospital Recommended Level of Care: [place] Prior Approval Number:    Date Approved/Denied: 05/23/17 PASRR Number: [id number]   Discharge Plan: SNF    Current Diagnoses: Patient Active Problem List   Diagnosis Date Noted  . ICH (intracerebral hemorrhage) ([place]) 05/20/2017  . Cellulitis of abdominal wall 06/29/2016  . Abscess of abdominal wall 06/29/2016  . Hypertension 06/29/2016  . DM2 (diabetes mellitus, type 2) ([place]) 06/29/2016  . Sepsis ([place]) 06/29/2016    Orientation RESPIRATION BLADDER Height & Weight     Self  Normal Continent Weight: 262 lb 5.6 oz (119 kg) Height:  5\' 11"  (180.3 cm)  BEHAVIORAL SYMPTOMS/MOOD NEUROLOGICAL BOWEL NUTRITION STATUS      Continent    AMBULATORY STATUS COMMUNICATION OF NEEDS Skin   Limited Assist Verbally Normal(Amputation)                       Personal Care Assistance Level of Assistance  Total care       Total Care Assistance: Limited assistance   Functional Limitations Info             SPECIAL CARE FACTORS FREQUENCY  PT (By licensed PT), OT (By licensed OT)     PT Frequency: 5x OT Frequency: 5x            Contractures      Additional Factors Info                  Current Medications (05/23/2017):  This is the current hospital active medication list Current Facility-Administered Medications  Medication Dose Route Frequency Provider Last Rate Last Dose  . acetaminophen  (TYLENOL) tablet 650 mg  650 mg Oral Q4H PRN Greta Doom, MD   650 mg at 05/23/17 1740   Or  . acetaminophen (TYLENOL) solution 650 mg  650 mg Per Tube Q4H PRN Greta Doom, MD       Or  . acetaminophen (TYLENOL) suppository 650 mg  650 mg Rectal Q4H PRN Greta Doom, MD      . amLODipine (NORVASC) tablet 10 mg  10 mg Oral Daily Candise Che A, NP   10 mg at 05/23/17 0953  . cloNIDine (CATAPRES) tablet 0.2 mg  0.2 mg Oral TID Rosalin Hawking, MD   0.2 mg at 05/23/17 0953  . cyanocobalamin tablet 500 mcg  500 mcg Oral Daily Costello, Mary A, NP   500 mcg at 05/23/17 0957  . hydrALAZINE (APRESOLINE) tablet 25 mg  25 mg Oral Q8H Rosalin Hawking, MD   25 mg at 05/23/17 0957  . insulin aspart (novoLOG) injection 0-15 Units  0-15 Units Subcutaneous TID WC Greta Doom, MD   3 Units at 05/23/17 0900  . labetalol (NORMODYNE,TRANDATE) injection 10 mg  10 mg Intravenous Q10 min PRN Mary Sella, NP   10 mg at 05/23/17 0815  . pantoprazole (PROTONIX) EC tablet 40 mg  40 mg Oral Daily Melvenia Beam, MD  40 mg at 05/23/17 0953  . senna-docusate (Senokot-S) tablet 1 tablet  1 tablet Oral BID Greta Doom, MD   1 tablet at 05/23/17 [phone]  . tamsulosin (FLOMAX) capsule 0.4 mg  0.4 mg Oral Daily Costello, Mary A, NP   0.4 mg at 05/23/17 8032     Discharge Medications: Please see discharge summary for a list of discharge medications.  Relevant Imaging Results:  Relevant Lab Results:   Additional Information No known allergies, SSN : [id number]  Archie Endo, LCSWA

## 2017-05-23 NOTE — Progress Notes (Addendum)
NEUROHOSPITALISTS STROKE TEAM - DAILY PROGRESS NOTE   SUBJECTIVE (INTERVAL HISTORY) No family is at the bedside. Patient is found laying in bed in NAD. Right UE and LE strength much improved, near normal now. CTA head and neck negative. BP at 140s now, off cleviprex. Not orientated to time and age and people.     OBJECTIVE Lab Results: CBC:  Recent Labs  Lab 05/20/17 2134 05/22/17 0253  WBC 7.0 7.4  HGB 13.5 14.7  HCT 39.3 42.5  MCV 102.1* 101.9*  PLT 103* 123*   BMP: Recent Labs  Lab 05/20/17 2134 05/22/17 0253 05/23/17 0305  NA 139 136 138  K 3.6 3.5 4.0  CL 105 103 107  CO2 21* 20* 22  GLUCOSE 122* 200* 155*  BUN 16 18 15   CREATININE 1.49* 1.59* 1.40*  CALCIUM 9.5 9.2 8.9   Liver Function Tests:  Recent Labs  Lab 05/20/17 2134  AST 84*  ALT 45  ALKPHOS 48  BILITOT 0.6  PROT 7.4  ALBUMIN 3.0*   Coagulation Studies:  Recent Labs    05/20/17 2134  INR 0.92   Urinalysis:  Recent Labs  Lab 05/20/17 2206  COLORURINE STRAW*  APPEARANCEUR CLEAR  LABSPEC 1.005  PHURINE 5.0  GLUCOSEU NEGATIVE  HGBUR NEGATIVE  BILIRUBINUR NEGATIVE  KETONESUR NEGATIVE  PROTEINUR 30*  NITRITE NEGATIVE  LEUKOCYTESUR NEGATIVE   Urine Drug Screen:     Component Value Date/Time   LABOPIA NONE DETECTED 05/20/2017 2206   COCAINSCRNUR POSITIVE (A) 05/20/2017 2206   LABBENZ NONE DETECTED 05/20/2017 2206   AMPHETMU NONE DETECTED 05/20/2017 2206   THCU NONE DETECTED 05/20/2017 2206   LABBARB NONE DETECTED 05/20/2017 2206     PHYSICAL EXAM Temp:  [97.9 F (36.6 C)-98.8 F (37.1 C)] 98.2 F (36.8 C) (01/26 1651) Pulse Rate:  [59-77] 69 (01/26 1651) Resp:  [11-33] 18 (01/26 1305) BP: (112-198)/(60-133) 154/91 (01/26 1651) SpO2:  [91 %-99 %] 98 % (01/26 1651) General - Well nourished, well developed, in no apparent distress HEENT-  Normocephalic,   Cardiovascular - Regular rate and rhythm  Respiratory - Lungs  clear bilaterally. No wheezing. Abdomen - soft and non-tender, BS normal Extremities- no edema or cyanosis Neuro: Mental Status: Patient is awake, alert, oriented to place, but not orientated to time, age, or people. He has an increased latency of speech and appears mildly confused. Cranial Nerves: II: Visual Fields are full. L pupil slightly larger than right, both are reactive.  III,IV, VI: L partial third nerve palsy, he has some movement in all directions, though limited medially.   V: Facial sensation is symmetric to temperature VII: Facial movement is mildly decreased on the right  VIII: hearing is intact to voice X: Uvula elevates symmetrically XI: Shoulder shrug is symmetric. XII: tongue is midline without atrophy or fasciculations.  Motor: He has a right hemiparesis, 4+/5 in the right arm, 4+/5 in the right leg. LUE and LLE 5-/5.  Sensory: Sensation is symmetric to light touch and temperature in the arms and legs. Deep Tendon Reflexes: 2+ and symmetric in the patellae.  Cerebellar: FNF slow on the right, but no definite ataxia.    IMAGING: I have personally reviewed the radiological images below and agree with the radiology interpretations.  Ct Head Wo Contrast 05/20/2017 IMPRESSION:  Acute 1.4 x 1.4 x 3 cm LEFT thalamic hematoma (usually hypertensive etiology) with intraventricular extension. No hydrocephalus. No significant mass effect.   Ct Head Wo Contrast 05/21/2017 IMPRESSION: Unchanged appearance of left thalamic  intraparenchymal hematoma with unchanged intraventricular extension.  Ct Angio Head and neck W Or Wo Contrast 05/23/2017 IMPRESSION:  1. Negative CTA of the head and neck. No large vessel occlusion. No aneurysm or other vascular abnormality.  2. Mild carotid siphon atherosclerotic change with resultant mild cavernous ICA stenoses. No high-grade or hemodynamically significant stenosis within the major arterial vasculature of the head and neck.  3. No  significant interval change in left thalamic intraparenchymal hemorrhage with associated intraventricular extension. No hydrocephalus or ventricular trapping.   2D echo - Left ventricle: The cavity size was mildly dilated. There was   moderate concentric hypertrophy. Systolic function was normal.   The estimated ejection fraction was in the range of 55% to 60%.   Wall motion was normal; there were no regional wall motion   abnormalities. Features are consistent with a pseudonormal left   ventricular filling pattern, with concomitant abnormal relaxation   and increased filling pressure (grade 2 diastolic dysfunction).   Doppler parameters are consistent with high ventricular filling   pressure. - Aortic valve: Trileaflet; normal thickness, mildly calcified   leaflets. - Mitral valve: There was mild regurgitation. - Left atrium: The atrium was mildly dilated. - Atrial septum: There was increased thickness of the septum,   consistent with lipomatous hypertrophy. - Tricuspid valve: There was trivial regurgitation.     IMPRESSION: Daniel [name] is a 57 y.o. male with PMH of +cocaine use, HTN and DM, medication non-compliance who reports right sided weakness. Head CT demonstrates ICH.  Acute 1.4 x 1.4 x 3 cm LEFT thalamic hematoma with intraventricular extension  Suspected Etiology: Hypertension and Cocaine use Resultant Symptoms: Right sided deficits Stroke Risk Factors: diabetes mellitus, hyperlipidemia and smoking Other Stroke Risk Factors: Advanced age, Cigarette smoker, Polysubstance Abuse Obesity, Body mass index is 36.59 kg/m.   Outstanding Stroke Work-up Studies:    Work up completed at this time  PLAN  05/23/2017: HOLD ASA  Restart Oral meds and titrate off of IV hypertensive medication If symptoms worsen or there is decreased mental status, repeat stat head CT Frequent neuro checks Telemetry monitoring PT/OT/SLP Consult PM & Rehab Consult Case Management /MSW-   For PCP and medication assistance Ongoing aggressive stroke risk factor management Patient counseled to be compliant with his medications Patient counseled on Lifestyle modifications including, Diet, Exercise, and Stress Follow up with [facility] in 6 weeks, [employer]: Acute on Chronic Kidney Disease with Mild AKI.  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 Home Meds: Catapres and HCTZ - Patient not taking per Admission Hx  HYPERLIPIDEMIA:    Component Value Date/Time   CHOL 140 05/21/2017 0632   TRIG 220 (H) 05/22/2017 0253   HDL 39 (L) 05/21/2017 0632   CHOLHDL 3.6 05/21/2017 0632   VLDL 51 (H) 05/21/2017 0632   LDLCALC 50 05/21/2017 0632   Home Meds:  NONE LDL  goal < 70, LDL 50,  Start statin at discharge, if necessary Monitor LFT's - elevated on  admission  PRE- DIABETES: HbgA1C - 6.7 HgbA1c goal < 7.0 Currently on: Novolog Continue CBG monitoring and SSI to maintain glucose 140-180 mg/dl DM education   TOBACCO ABUSE & POLYSUBSTANCE ABUSE UDS+ Current smoker Smoking cessation counseling provided Nicotine patch provided  OBESITY Obesity, Body mass index is 36.59 kg/m. Greater than/equal to 30  Other Active Problems: Active Problems:   ICH (intracerebral hemorrhage) [facility])    Hospital day # 3 VTE prophylaxis:  SCD's  Diet : Fall precautions Diet Heart Room service appropriate? Yes; Fluid consistency:  Thin   FAMILY UPDATES: No family at bedside  TEAM UPDATES: Rosalin Hawking, MD   Prior Home Stroke Medications:  No antithrombotic  Discharge Stroke Meds:  Please discharge patient on TBD   Disposition: 01-Home or Self Care Therapy Recs:               SNF Follow Up:  Follow-up Information    Dennie Bible, NP. Schedule an appointment as soon as possible for a visit in 6 week(s).   Specialty:  Family Medicine Contact information: [address] [place] [address] [phone]          Patient, No Pcp Per -PCP Follow up in 1-2 weeks   Case Management aware of need    ATTENDING NOTE: I reviewed above note and agree with the assessment and plan. I have made any additions or clarifications directly to the above note. Pt was seen and examined.   57 year old male with history of hypertension, diabetes admitted for right-sided weakness.  CT showed left thalamic ICH and small IVH.  Repeat CT stable ICH and IVH.  CTA head and neck unremarkable, no aneurysm or AVM.  EF 55-60%.  LDL 50 and A1c 6.7.  His ICH most likely due to uncontrolled hypertension.    He was on Cleviprex IV initially, transition to p.o. with Norvasc and clonidine.  No added hydralazine.  Creatinine 1.59 down to 1.4.  PT/OT recommend SNF.  Rosalin Hawking, MD PhD Stroke Neurology 05/23/2017 5:58 PM  This patient is critically ill due to acute ICH and IVH, hypertensive emergency and at significant risk of neurological worsening, death form hematoma expansion, heart failure, hypertensive encephalopathy. This patient's care requires constant monitoring of vital signs, hemodynamics, respiratory and cardiac monitoring, review of multiple databases, neurological assessment, discussion with family, other specialists and medical decision making of high complexity. I spent 35 minutes of neurocritical care time in the care of this patient.   To contact Stroke Continuity provider, please refer to [email or web address] After  hours, contact General Neurology

## 2017-05-23 NOTE — Progress Notes (Signed)
  Echocardiogram 2D Echocardiogram has been performed.  Daniel [name] 05/23/2017, 3:45 PM

## 2017-05-23 NOTE — Progress Notes (Signed)
CSW completed assessment, FL2, faxed out to local facilities for SNF placement. Currently awaiting bed offers.  Madilyn Fireman, MSW, LCSW-A Weekend Clinical Social Worker [phone]

## 2017-05-23 NOTE — Clinical Social Work Note (Signed)
Clinical Social Work Assessment  Patient Details  Name: Daniel [name] MRN: [id number] Date of Birth: [birth year]  Date of referral:  05/23/17               Reason for consult:  Facility Placement                Permission sought to share information with:  Facility Sport and exercise psychologist, Family Supports Permission granted to share information::  Yes, Verbal Permission Granted  Name::     Music therapist::     Relationship::  Brother  Contact Information:  [phone]  Housing/Transportation Living arrangements for the past 2 months:  Single Family Home Source of Information:  Siblings Patient Interpreter Needed:  None Criminal Activity/Legal Involvement Pertinent to Current Situation/Hospitalization:  No - Comment as needed Significant Relationships:  Siblings Lives with:  Friends Do you feel safe going back to the place where you live?  Yes Need for family participation in patient care:  No (Coment)  Care giving concerns: Patient only oriented to himself, not to place, time, or situation. CSW contacted patient's brother [name] to discuss SNF placement process.   Social Worker assessment / plan:  CSW did not attempt to meet with patient due to him only being oriented to himself, contacted patient's brother [name] to discuss placement process and to gain permission to begin referral process. Ricky expressed agreement after CSW explained process througoholy and informed him of recommendations from PT/OT. Per notes, patient is already confused so CSW did not want to create any additional confusion.   Employment status:  (Unknown) Insurance information:  Self Pay (Medicaid Pending) PT Recommendations:  [place] / Referral to community resources:  [place]  Patient/Family's Response to care:  Patient's brother [name] expressed understanding and acceptance of SNF placement at discharge.   Patient/Family's Understanding of and Emotional  Response to Diagnosis, Current Treatment, and Prognosis:  Family member is understanding of needed treatment plan including discharging to SNF.  Emotional Assessment Appearance:    Attitude/Demeanor/Rapport:    Affect (typically observed):    Orientation:  Oriented to Self Alcohol / Substance use:  Not Applicable Psych involvement (Current and /or in the community):  No (Comment)  Discharge Needs  Concerns to be addressed:  Discharge Planning Concerns Readmission within the last 30 days:  No Current discharge risk:    Barriers to Discharge:   Lives with friends   Archie Endo, [place] 05/23/2017, 9:54 AM

## 2017-05-24 DIAGNOSIS — N179 Acute kidney failure, unspecified: Secondary | ICD-10-CM

## 2017-05-24 DIAGNOSIS — G629 Polyneuropathy, unspecified: Secondary | ICD-10-CM

## 2017-05-24 LAB — GLUCOSE, CAPILLARY
GLUCOSE-CAPILLARY: 177 mg/dL — AB (ref 65–99)
Glucose-Capillary: 122 mg/dL — ABNORMAL HIGH (ref 65–99)
Glucose-Capillary: 133 mg/dL — ABNORMAL HIGH (ref 65–99)
Glucose-Capillary: 133 mg/dL — ABNORMAL HIGH (ref 65–99)

## 2017-05-24 LAB — CBC
HCT: 42.5 % (ref 39.0–52.0)
HEMOGLOBIN: 14.7 g/dL (ref 13.0–17.0)
MCH: 35.7 pg — ABNORMAL HIGH (ref 26.0–34.0)
MCHC: 34.6 g/dL (ref 30.0–36.0)
MCV: 103.2 fL — ABNORMAL HIGH (ref 78.0–100.0)
PLATELETS: 109 10*3/uL — AB (ref 150–400)
RBC: 4.12 MIL/uL — ABNORMAL LOW (ref 4.22–5.81)
RDW: 13.3 % (ref 11.5–15.5)
WBC: 7.8 10*3/uL (ref 4.0–10.5)

## 2017-05-24 LAB — BASIC METABOLIC PANEL
Anion gap: 9 (ref 5–15)
BUN: 16 mg/dL (ref 6–20)
CO2: 22 mmol/L (ref 22–32)
CREATININE: 1.37 mg/dL — AB (ref 0.61–1.24)
Calcium: 8.8 mg/dL — ABNORMAL LOW (ref 8.9–10.3)
Chloride: 106 mmol/L (ref 101–111)
GFR, EST NON AFRICAN AMERICAN: 56 mL/min — AB (ref >60)
Glucose, Bld: 160 mg/dL — ABNORMAL HIGH (ref 65–99)
Potassium: 4 mmol/L (ref 3.5–5.1)
SODIUM: 137 mmol/L (ref 135–145)

## 2017-05-24 MED ORDER — BUTALBITAL-APAP-CAFFEINE 50-325-40 MG PO TABS
1.0000 | ORAL_TABLET | Freq: Two times a day (BID) | ORAL | Status: DC | PRN
  Administered 2017-05-24 – 2017-05-25 (×2): 1 via ORAL
  Filled 2017-05-24 (×2): qty 1

## 2017-05-24 MED ORDER — HYDRALAZINE HCL 50 MG PO TABS
50.0000 mg | ORAL_TABLET | Freq: Three times a day (TID) | ORAL | Status: DC
  Administered 2017-05-24 – 2017-05-25 (×4): 50 mg via ORAL
  Filled 2017-05-24 (×4): qty 1

## 2017-05-24 MED ORDER — ACETAMINOPHEN 325 MG PO TABS: 650.0000 mg | ORAL_TABLET | Freq: Four times a day (QID) | ORAL | Status: DC | PRN

## 2017-05-24 MED ORDER — GABAPENTIN 100 MG PO CAPS
100.0000 mg | ORAL_CAPSULE | Freq: Two times a day (BID) | ORAL | Status: DC
  Administered 2017-05-24 – 2017-05-25 (×3): 100 mg via ORAL
  Filled 2017-05-24 (×3): qty 1

## 2017-05-24 NOTE — Progress Notes (Signed)
NEUROHOSPITALISTS STROKE TEAM - DAILY PROGRESS NOTE   SUBJECTIVE (INTERVAL HISTORY) No family is at the bedside. Patient complains of mild frontal HA, no neuro changes. He also complains of b/l toes painful sensation. Does not have good appetite.     OBJECTIVE Lab Results: CBC:  Recent Labs  Lab 05/20/17 2134 05/22/17 0253 05/24/17 0241  WBC 7.0 7.4 7.8  HGB 13.5 14.7 14.7  HCT 39.3 42.5 42.5  MCV 102.1* 101.9* 103.2*  PLT 103* 123* 109*   BMP: Recent Labs  Lab 05/20/17 2134 05/22/17 0253 05/23/17 0305 05/24/17 0241  NA 139 136 138 137  K 3.6 3.5 4.0 4.0  CL 105 103 107 106  CO2 21* 20* 22 22  GLUCOSE 122* 200* 155* 160*  BUN 16 18 15 16   CREATININE 1.49* 1.59* 1.40* 1.37*  CALCIUM 9.5 9.2 8.9 8.8*   Liver Function Tests:  Recent Labs  Lab 05/20/17 2134  AST 84*  ALT 45  ALKPHOS 48  BILITOT 0.6  PROT 7.4  ALBUMIN 3.0*   Coagulation Studies:  No results for input(s): APTT, INR in the last 72 hours. Urinalysis:  Recent Labs  Lab 05/20/17 2206  COLORURINE STRAW*  APPEARANCEUR CLEAR  LABSPEC 1.005  PHURINE 5.0  GLUCOSEU NEGATIVE  HGBUR NEGATIVE  BILIRUBINUR NEGATIVE  KETONESUR NEGATIVE  PROTEINUR 30*  NITRITE NEGATIVE  LEUKOCYTESUR NEGATIVE   Urine Drug Screen:     Component Value Date/Time   LABOPIA NONE DETECTED 05/20/2017 2206   COCAINSCRNUR POSITIVE (A) 05/20/2017 2206   LABBENZ NONE DETECTED 05/20/2017 2206   AMPHETMU NONE DETECTED 05/20/2017 2206   THCU NONE DETECTED 05/20/2017 2206   LABBARB NONE DETECTED 05/20/2017 2206     PHYSICAL EXAM Temp:  [97.9 F (36.6 C)-98.6 F (37 C)] 98.3 F (36.8 C) (01/27 0531) Pulse Rate:  [60-73] 73 (01/27 0531) Resp:  [13-27] 18 (01/27 0531) BP: (137-167)/(77-110) 141/83 (01/27 0531) SpO2:  [94 %-99 %] 98 % (01/27 0531) General - Well nourished, well developed, in no apparent distress HEENT-  Normocephalic,   Cardiovascular - Regular rate  and rhythm  Respiratory - Lungs clear bilaterally. No wheezing. Abdomen - soft and non-tender, BS normal Extremities- no edema or cyanosis Neuro: Mental Status: Patient is awake, alert, oriented to place, but not orientated to time, age, or people. He has an increased latency of speech and appears mildly confused. Cranial Nerves: II: Visual Fields are full. L pupil slightly larger than right, both are reactive.  III,IV, VI: L partial third nerve palsy, he has some movement in all directions, though limited medially.   V: Facial sensation is symmetric to temperature VII: Facial movement is mildly decreased on the right  VIII: hearing is intact to voice X: Uvula elevates symmetrically XI: Shoulder shrug is symmetric. XII: tongue is midline without atrophy or fasciculations.  Motor: He has a right hemiparesis, 4+/5 in the right arm, 4+/5 in the right leg. LUE and LLE 5-/5.  Sensory: Sensation is symmetric to light touch and temperature in the arms and legs. Deep Tendon Reflexes: 2+ and symmetric in the patellae.  Cerebellar: FNF slow on the right, but no definite ataxia.    IMAGING: I have personally reviewed the radiological images below and agree with the radiology interpretations.  Ct Head Wo Contrast 05/20/2017 IMPRESSION:  Acute 1.4 x 1.4 x 3 cm LEFT thalamic hematoma (usually hypertensive etiology) with intraventricular extension. No hydrocephalus. No significant mass effect.   Ct Head Wo Contrast 05/21/2017 IMPRESSION: Unchanged appearance of left  thalamic intraparenchymal hematoma with unchanged intraventricular extension.  Ct Angio Head and neck W Or Wo Contrast 05/23/2017 IMPRESSION:  1. Negative CTA of the head and neck. No large vessel occlusion. No aneurysm or other vascular abnormality.  2. Mild carotid siphon atherosclerotic change with resultant mild cavernous ICA stenoses. No high-grade or hemodynamically significant stenosis within the major arterial  vasculature of the head and neck.  3. No significant interval change in left thalamic intraparenchymal hemorrhage with associated intraventricular extension. No hydrocephalus or ventricular trapping.   2D echo - Left ventricle: The cavity size was mildly dilated. There was   moderate concentric hypertrophy. Systolic function was normal.   The estimated ejection fraction was in the range of 55% to 60%.   Wall motion was normal; there were no regional wall motion   abnormalities. Features are consistent with a pseudonormal left   ventricular filling pattern, with concomitant abnormal relaxation   and increased filling pressure (grade 2 diastolic dysfunction).   Doppler parameters are consistent with high ventricular filling   pressure. - Aortic valve: Trileaflet; normal thickness, mildly calcified   leaflets. - Mitral valve: There was mild regurgitation. - Left atrium: The atrium was mildly dilated. - Atrial septum: There was increased thickness of the septum,   consistent with lipomatous hypertrophy. - Tricuspid valve: There was trivial regurgitation.     IMPRESSION: [name] is a 57 y.o. male with PMH of +cocaine use, HTN and DM, medication non-compliance who reports right sided weakness. Head CT demonstrates ICH.  Acute 1.4 x 1.4 x 3 cm LEFT thalamic hematoma with intraventricular extension  Suspected Etiology: Hypertension and Cocaine use Resultant Symptoms: Right sided deficits Stroke Risk Factors: diabetes mellitus, hyperlipidemia and smoking Other Stroke Risk Factors: Advanced age, Cigarette smoker, Polysubstance Abuse Obesity, Body mass index is 36.59 kg/m.   Outstanding Stroke Work-up Studies:    Work up completed at this time  PLAN  05/24/2017: HOLD ASA  Restart Oral meds and titrate off of IV hypertensive medication If symptoms worsen or there is decreased mental status, repeat stat head CT Frequent neuro checks Telemetry monitoring PT/OT/SLP Consult  PM & Rehab Consult Case Management /MSW-  For PCP and medication assistance Ongoing aggressive stroke risk factor management Patient counseled to be compliant with his medications Patient counseled on Lifestyle modifications including, Diet, Exercise, and Stress Follow up with [facility] in 6 weeks, [place]: Acute on Chronic Kidney Disease with Mild AKI.  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 Home Meds: Catapres and HCTZ - Patient not taking per Admission Hx  HYPERLIPIDEMIA:    Component Value Date/Time   CHOL 140 05/21/2017 0632   TRIG 220 (H) 05/22/2017 0253   HDL 39 (L) 05/21/2017 0632   CHOLHDL 3.6 05/21/2017 0632   VLDL 51 (H) 05/21/2017 0632   LDLCALC 50 05/21/2017 0632   Home Meds:  NONE LDL  goal < 70, LDL 50,  Start statin at discharge, if necessary Monitor LFT's -  elevated on admission  PRE- DIABETES: HbgA1C - 6.7 HgbA1c goal < 7.0 Currently on: Novolog Continue CBG monitoring and SSI to maintain glucose 140-180 mg/dl DM education   TOBACCO ABUSE & POLYSUBSTANCE ABUSE UDS+ Current smoker Smoking cessation counseling provided Nicotine patch provided  OBESITY Obesity, Body mass index is 36.59 kg/m. Greater than/equal to 30  Other Active Problems:  ICH (intracerebral hemorrhage) (HCC)  Thrombocytopenia - [facility] day # 4 VTE prophylaxis:  SCD's  Diet : Fall  precautions Diet Heart Room service appropriate? Yes; Fluid consistency: Thin   FAMILY UPDATES: No family at bedside  TEAM UPDATES: Rosalin Hawking, MD   Prior Home Stroke Medications:  No antithrombotic  Discharge Stroke Meds:  Please discharge patient on TBD   Disposition: 01-Home or Self Care Therapy Recs:               SNF Follow Up:  Follow-up Information    Dennie Bible, NP. Schedule an appointment as soon as possible for a visit in 6 week(s).   Specialty:  Family Medicine Contact information: [address] [place] [address] [phone]          Patient, No Pcp Per -PCP Follow up in 1-2 weeks   Case Management aware of need    ATTENDING NOTE: I reviewed above note and agree with the assessment and plan. I have made any additions or clarifications directly to the above note. Pt was seen and examined.   57 year old male with history of hypertension, diabetes admitted for right-sided weakness.  CT showed left thalamic ICH and small IVH.  Repeat CT stable ICH and IVH.  CTA head and neck unremarkable, no aneurysm or AVM.  EF 55-60%.  LDL 50 and A1c 6.7.  His ICH most likely due to uncontrolled hypertension.    He is off clevipres, transition to p.o. with Norvasc and clonidine and hydralazine. Increased hydralazine to 50mg  Q8h. Creatinine 1.59 ->1.4->1.37.  Also put on gabapentin for b/l toe pain likely due to neuropathy. Put on fioricet PRN for HA. PT/OT recommend SNF.  Rosalin Hawking, MD PhD Stroke Neurology 05/24/2017 5:13 PM  To contact Stroke Continuity provider, please refer to [email or web address] After hours, contact General Neurology

## 2017-05-25 DIAGNOSIS — D696 Thrombocytopenia, unspecified: Secondary | ICD-10-CM

## 2017-05-25 DIAGNOSIS — I610 Nontraumatic intracerebral hemorrhage in hemisphere, subcortical: Secondary | ICD-10-CM

## 2017-05-25 DIAGNOSIS — F1410 Cocaine abuse, uncomplicated: Secondary | ICD-10-CM

## 2017-05-25 DIAGNOSIS — I161 Hypertensive emergency: Secondary | ICD-10-CM

## 2017-05-25 DIAGNOSIS — I615 Nontraumatic intracerebral hemorrhage, intraventricular: Secondary | ICD-10-CM

## 2017-05-25 LAB — CBC
HCT: 43.4 % (ref 39.0–52.0)
HEMOGLOBIN: 14.6 g/dL (ref 13.0–17.0)
MCH: 35 pg — ABNORMAL HIGH (ref 26.0–34.0)
MCHC: 33.6 g/dL (ref 30.0–36.0)
MCV: 104.1 fL — ABNORMAL HIGH (ref 78.0–100.0)
Platelets: 100 10*3/uL — ABNORMAL LOW (ref 150–400)
RBC: 4.17 MIL/uL — AB (ref 4.22–5.81)
RDW: 13.3 % (ref 11.5–15.5)
WBC: 7.1 10*3/uL (ref 4.0–10.5)

## 2017-05-25 LAB — BASIC METABOLIC PANEL
Anion gap: 9 (ref 5–15)
BUN: 17 mg/dL (ref 6–20)
CHLORIDE: 106 mmol/L (ref 101–111)
CO2: 21 mmol/L — ABNORMAL LOW (ref 22–32)
CREATININE: 1.41 mg/dL — AB (ref 0.61–1.24)
Calcium: 8.8 mg/dL — ABNORMAL LOW (ref 8.9–10.3)
GFR, EST NON AFRICAN AMERICAN: 54 mL/min — AB (ref >60)
Glucose, Bld: 141 mg/dL — ABNORMAL HIGH (ref 65–99)
POTASSIUM: 3.7 mmol/L (ref 3.5–5.1)
SODIUM: 136 mmol/L (ref 135–145)

## 2017-05-25 LAB — GLUCOSE, CAPILLARY
GLUCOSE-CAPILLARY: 181 mg/dL — AB (ref 65–99)
Glucose-Capillary: 135 mg/dL — ABNORMAL HIGH (ref 65–99)

## 2017-05-25 MED ORDER — CYANOCOBALAMIN 500 MCG PO TABS: 500.0000 ug | ORAL_TABLET | Freq: Every day | ORAL | 1 refills | Status: DC

## 2017-05-25 MED ORDER — HYDRALAZINE HCL 50 MG PO TABS: 50.0000 mg | ORAL_TABLET | Freq: Three times a day (TID) | ORAL | 1 refills | Status: DC

## 2017-05-25 MED ORDER — CLONIDINE HCL 0.2 MG PO TABS: 0.2000 mg | ORAL_TABLET | Freq: Three times a day (TID) | ORAL | 11 refills | Status: DC

## 2017-05-25 MED ORDER — GABAPENTIN 100 MG PO CAPS: 100.0000 mg | ORAL_CAPSULE | Freq: Two times a day (BID) | ORAL | 1 refills | Status: DC

## 2017-05-25 MED ORDER — AMLODIPINE BESYLATE 10 MG PO TABS: 10.0000 mg | ORAL_TABLET | Freq: Every day | ORAL | 1 refills | Status: DC

## 2017-05-25 MED FILL — ?HYDRALAZINE 50MG TABLET: 50 | 10 days supply | Qty: 30 | Fill #0

## 2017-05-25 MED FILL — ?CLONIDINE HCL 0.2 MG TABLE: 0.2 | 20 days supply | Qty: 60 | Fill #0

## 2017-05-25 MED FILL — AMLODIPINE BESYLATE 10 MG T: 10 | 30 days supply | Qty: 30 | Fill #0

## 2017-05-25 MED FILL — GABAPENTIN 100 MG CAPSULE: 100 | 30 days supply | Qty: 60 | Fill #0

## 2017-05-25 NOTE — Discharge Summary (Addendum)
Stroke Discharge Summary  Patient ID: izick gasbarro   MRN: [id number]      DOB: 08-24-1960  Date of Admission: 05/20/2017 Date of Discharge: 05/25/2017  Attending Physician:  Rosalin Hawking, MD, Stroke MD Consultant(s):   Treatment Team:  Stroke, Md, MD None  Patient's PCP:  Patient, No Pcp Per  DISCHARGE DIAGNOSIS:  Right thalamic ICH with IVH   Active Problems: Thrombocytopenia Urinary Retention Hypertension Pre-Diabetes Tobacco Abuse Polysubstance Abuse Obesity  Past Medical History:  Diagnosis Date  . Diabetes mellitus without complication ([place])   . Hypertension   . Shoulder pain, left 06/28/2013   Past Surgical History:  Procedure Laterality Date  . APPENDECTOMY    . COLONOSCOPY  01/23/2012   Procedure: COLONOSCOPY;  Surgeon: Danie Binder, MD;  Location: [facility];  Service: Endoscopy;  Laterality: N/A;  11:10 AM  . INCISION AND DRAINAGE ABSCESS N/A 06/29/2016   Procedure: INCISION AND DRAINAGE ABDOMINAL WALL ABSCESS;  Surgeon: Alphonsa Overall, MD;  Location: [facility];  Service: General;  Laterality: N/A;  . Left heel surgery      Allergies as of 05/25/2017   No Known Allergies     Medication List    STOP taking these medications   clotrimazole 1 % cream Commonly known as:  LOTRIMIN   doxycycline 100 MG tablet Commonly known as:  VIBRA-TABS   hydrochlorothiazide 25 MG tablet Commonly known as:  HYDRODIURIL   oxyCODONE 5 MG immediate release tablet Commonly known as:  Oxy IR/ROXICODONE     TAKE these medications   amLODipine 10 MG tablet Commonly known as:  NORVASC Take 1 tablet (10 mg total) by mouth daily. Start taking on:  05/26/2017   blood glucose meter kit and supplies Kit Dispense based on patient and insurance preference. Use up to four times daily as directed. (FOR ICD-9 250.00, 250.01).   cloNIDine 0.2 MG tablet Commonly known as:  CATAPRES Take 1 tablet (0.2 mg total) by mouth 3 (three) times daily. What changed:    how much to  take  when to take this  Another medication with the same name was removed. Continue taking this medication, and follow the directions you see here.   cyanocobalamin 500 MCG tablet Take 1 tablet (500 mcg total) by mouth daily. Start taking on:  05/26/2017   gabapentin 100 MG capsule Commonly known as:  NEURONTIN Take 1 capsule (100 mg total) by mouth 2 (two) times daily.   hydrALAZINE 50 MG tablet Commonly known as:  APRESOLINE Take 1 tablet (50 mg total) by mouth every 8 (eight) hours.   metFORMIN 500 MG tablet Commonly known as:  GLUCOPHAGE Take 1 tablet (500 mg total) by mouth daily with breakfast.      LABORATORY STUDIES CBC    Component Value Date/Time   WBC 7.1 05/25/2017 0615   RBC 4.17 (L) 05/25/2017 0615   HGB 14.6 05/25/2017 0615   HCT 43.4 05/25/2017 0615   PLT 100 (L) 05/25/2017 0615   MCV 104.1 (H) 05/25/2017 0615   MCH 35.0 (H) 05/25/2017 0615   MCHC 33.6 05/25/2017 0615   RDW 13.3 05/25/2017 0615   LYMPHSABS 3.2 05/20/2017 2134   MONOABS 0.7 05/20/2017 2134   EOSABS 0.1 05/20/2017 2134   BASOSABS 0.0 05/20/2017 2134   CMP    Component Value Date/Time   NA 136 05/25/2017 0615   K 3.7 05/25/2017 0615   CL 106 05/25/2017 0615   CO2 21 (L) 05/25/2017 0615  GLUCOSE 141 (H) 05/25/2017 0615   BUN 17 05/25/2017 0615   CREATININE 1.41 (H) 05/25/2017 0615   CALCIUM 8.8 (L) 05/25/2017 0615   PROT 7.4 05/20/2017 2134   ALBUMIN 3.0 (L) 05/20/2017 2134   AST 84 (H) 05/20/2017 2134   ALT 45 05/20/2017 2134   ALKPHOS 48 05/20/2017 2134   BILITOT 0.6 05/20/2017 2134   GFRNONAA 54 (L) 05/25/2017 0615   GFRAA >60 05/25/2017 0615   COAGS Lab Results  Component Value Date   INR 0.92 05/20/2017   INR 0.94 06/29/2016   INR 0.92 05/17/2010   Lipid Panel    Component Value Date/Time   CHOL 140 05/21/2017 0632   TRIG 220 (H) 05/22/2017 0253   HDL 39 (L) 05/21/2017 0632   CHOLHDL 3.6 05/21/2017 0632   VLDL 51 (H) 05/21/2017 0632   LDLCALC 50 05/21/2017  0632   HgbA1C  Lab Results  Component Value Date   HGBA1C 6.7 (H) 05/21/2017   Urinalysis    Component Value Date/Time   COLORURINE STRAW (A) 05/20/2017 2206   APPEARANCEUR CLEAR 05/20/2017 2206   LABSPEC 1.005 05/20/2017 2206   PHURINE 5.0 05/20/2017 2206   GLUCOSEU NEGATIVE 05/20/2017 2206   HGBUR NEGATIVE 05/20/2017 2206   BILIRUBINUR NEGATIVE 05/20/2017 2206   KETONESUR NEGATIVE 05/20/2017 2206   PROTEINUR 30 (A) 05/20/2017 2206   UROBILINOGEN 0.2 12/31/2009 0746   NITRITE NEGATIVE 05/20/2017 2206   LEUKOCYTESUR NEGATIVE 05/20/2017 2206   Urine Drug Screen     Component Value Date/Time   LABOPIA NONE DETECTED 05/20/2017 2206   COCAINSCRNUR POSITIVE (A) 05/20/2017 2206   LABBENZ NONE DETECTED 05/20/2017 2206   AMPHETMU NONE DETECTED 05/20/2017 2206   THCU NONE DETECTED 05/20/2017 2206   LABBARB NONE DETECTED 05/20/2017 2206    Alcohol Level    Component Value Date/Time   ETH <10 05/21/2017 0631    SIGNIFICANT DIAGNOSTIC STUDIES Ct Head Wo Contrast 05/20/2017 IMPRESSION:  Acute 1.4 x 1.4 x 3 cm LEFT thalamic hematoma (usually hypertensive etiology) with intraventricular extension. No hydrocephalus. No significant mass effect.   Ct Head Wo Contrast 05/21/2017 IMPRESSION: Unchanged appearance of left thalamic intraparenchymal hematoma with unchanged intraventricular extension.  Ct Angio Head and neck W Or Wo Contrast 05/23/2017 IMPRESSION:  1. Negative CTA of the head and neck. No large vessel occlusion. No aneurysm or other vascular abnormality.  2. Mild carotid siphon atherosclerotic change with resultant mild cavernous ICA stenoses. No high-grade or hemodynamically significant stenosis within the major arterial vasculature of the head and neck.  3. No significant interval change in left thalamic intraparenchymal hemorrhage with associated intraventricular extension. No hydrocephalus or ventricular trapping.   2D echo - Left ventricle: The cavity size was  mildly dilated. There was moderate concentric hypertrophy. Systolic function was normal. The estimated ejection fraction was in the range of 55% to 60%. Wall motion was normal; there were no regional wall motion abnormalities. Features are consistent with a pseudonormal left ventricular filling pattern, with concomitant abnormal relaxation and increased filling pressure (grade 2 diastolic dysfunction). Doppler parameters are consistent with high ventricular filling pressure. - Aortic valve: Trileaflet; normal thickness, mildly calcified leaflets. - Mitral valve: There was mild regurgitation. - Left atrium: The atrium was mildly dilated. - Atrial septum: There was increased thickness of the septum, consistent with lipomatous hypertrophy. - Tricuspid valve: There was trivial regurgitation.    HISTORY OF [place] COURSE 57 year old male with history of hypertension,  diabetes admitted for right-sided weakness.  CT showed left thalamic ICH and small IVH.  Repeat CT stable ICH and IVH.  CTA head and neck unremarkable, no aneurysm or AVM.  EF 55-60%.  LDL 50 and A1c 6.7.  His ICH most likely due to uncontrolled hypertension.    He is off clevipres, transition to p.o. with Norvasc and clonidine and hydralazine. Increased hydralazine to 73m Q8h. Creatinine 1.59 ->1.4->1.37.  Also put on gabapentin for b/l toe pain likely due to neuropathy. Put on fioricet PRN for HA. PT/OT recommend SNF  Acute 1.4 x 1.4 x 3 cm LEFT thalamic hematoma with intraventricular extension  Suspected Etiology: Hypertension and Cocaine use Resultant Symptoms: Right sided deficits Stroke Risk Factors: diabetes mellitus, hyperlipidemia and smoking Other Stroke Risk Factors: Advanced age, Cigarette smoker, Polysubstance Abuse Obesity, Body mass index is 36.59 kg/m.   Outstanding Stroke Work-up Studies:    Work up completed at this time  PLAN  05/24/2017: HOLD ASA   Restart Oral meds and titrate off of IV hypertensive medication If symptoms worsen or there is decreased mental status, repeat stat head CT Frequent neuro checks Telemetry monitoring PT/OT/SLP Consult Case Management /MSW-  For PCP and medication assistance Ongoing aggressive stroke risk factor management Patient counseled to be compliant withhismedications Patient counseled on Lifestyle modifications including, Diet, Exercise, and Stress [facility] [place] Acute on Chronic Kidney Disease with Mild AKI.  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 Close Follow up with PCP  HYPERLIPIDEMIA: Labs(Brief)          Component Value Date/Time   CHOL 140 05/21/2017 0632   TRIG 220 (H) 05/22/2017 0253   HDL 39 (L) 05/21/2017 0632   CHOLHDL 3.6 05/21/2017 0632   VLDL 51 (H) 05/21/2017 0632   LDLCALC 50 05/21/2017 0632    Home Meds:  NONE LDL  goal < 70, LDL 50,  No need to start statin at discharge  PRE- DIABETES: HbgA1C - 6.7 HgbA1c goal < 7.0 Follow up with PCP  TOBACCO ABUSE &POLYSUBSTANCE ABUSE UDS+ Current smoker Smoking cessation counseling provided Nicotine patch provided  OBESITY Obesity, Body mass index is 36.59 kg/m. Greater than/equal to 30  DISCHARGE EXAM Blood pressure 128/78, pulse 60, temperature 98 F (36.7 C), temperature source Oral, resp. rate 18, height _0  (1.803 m), weight 119 kg (262 lb 5.6 oz), SpO2 98  %. General - Well nourished, well developed, in no apparent distress HEENT-  Normocephalic,   Cardiovascular - Regular rate and rhythm  Respiratory - Lungs clear bilaterally. No wheezing. Abdomen - soft and non-tender, BS normal Extremities- no edema or cyanosis Neuro: Mental Status: Patient is awake, alert, oriented x 3. Speech is dysarthric but understandable.  Cranial Nerves: II: Visual Fields are full. L pupil slightly larger than right, both are reactive.  III,IV, VI: L partial third nerve palsy, he has some movement in all directions, though limited medially.   V: Facial sensation is symmetric to temperature VII: Facial movement is mildly  decreased on the right  VIII: hearing is intact to voice X: Uvula elevates symmetrically XI: Shoulder shrug is symmetric. XII: tongue is midline without atrophy or fasciculations.  Motor: He has a right hemiparesis, 4+/5 in the right arm, 4+/5 in the right leg. LUE and LLE 5-/5.  Sensory: Sensation is symmetric to light touch and temperature in the arms and legs. Deep Tendon Reflexes: 2+ and symmetric in the patellae.  Cerebellar: FNF slow on the right, but no definite ataxia. Patient was able to ambulate independently with PT in the hallway this morning.  Discharge Diet   Fall precautions Diet Heart Room service appropriate? Yes; Fluid consistency: Thin liquids  DISCHARGE PLAN  Disposition:  HOME with family  No antithrombotic for secondary stroke prevention, until follow up with Neurology  Ongoing risk factor control by Primary Care Physician at time of discharge  [place] in 2 weeks.   [facility] in [facility] weeks, office to schedule an appointment.  Greater than 30 minutes were spent preparing discharge.  Mary Sella, ANP-C Stroke Neurology Team 05/25/2017 3:00 PM  ATTENDING NOTE: I reviewed above note and agree with the assessment and plan. I  have made any additions or clarifications directly to the above note. Pt was seen and examined.   57 year old male with history of hypertension, diabetes admitted for right-sided weakness.  CT showed left thalamic ICH and small IVH.  Repeat CT stable ICH and IVH.  CTA head and neck unremarkable, no aneurysm or AVM.  EF 55-60%.  LDL 50 and A1c 6.7. UDS positive for cocaine. His ICH most likely due to uncontrolled hypertension.    His BP now stable with Norvasc and clonidine and hydralazine. Creatinine 1.59 ->1.4->1.37 now at baseline.  Also put on gabapentin for b/l toe pain likely due to neuropathy. PT/OT recommend home PT/OT. Will need PCP arrange for follow up.   Will follow up with neuro clinic to repeat head CT, if blood absorbed, may consider baby ASA for stroke prevention. However, he does have thrombocytopenia with platelet only at 100. Also need monitoring before starting ASA. Educated for cocaine cessation.   Rosalin Hawking, MD PhD Stroke Neurology 05/25/2017 6:11 PM

## 2017-05-25 NOTE — Progress Notes (Signed)
Pt discharging home. Pt walked 300 feet today with PT. Pt states he has 24/7 support at home. CM spoke to Daniel [name], pt's brother, and he agrees that the patient has support at home. He also states he will check in on him regularly.  CM notified Butch Penny with [facility] that patient has [place] orders and no insurance. Butch Penny going to see if qualifies for [facility] services.  Pt without insurance or PCP. CM was able to get him an appointment at the [facility]. Information on the AVS and given to the patient. Pt to use [facility] for his pharmacy needs. Pt's prescriptions faxed to [facility] pharmacy to be filled.  Daniel [name] to provide transportation to [facility] pharmacy and then home.

## 2017-05-25 NOTE — Progress Notes (Signed)
Pt discharged home with cousin and uncle. AVS and scripts reviewed and given to patient. CM set patient up with [facility] for [facility] needs. All belongings sent with patient. VSS. BP 128/78 (BP Location: Right Arm)   Pulse 60   Temp 98 F (36.7 C) (Oral)   Resp 18   Ht 5\' 11"  (1.803 m)   Wt 119 kg (262 lb 5.6 oz)   SpO2 98%   BMI 36.59 kg/m

## 2017-05-25 NOTE — Plan of Care (Signed)
  Progressing Education: Knowledge of General Education information will improve 05/25/2017 1042 - Progressing by Harlin Heys, RN Health Behavior/Discharge Planning: Ability to manage health-related needs will improve 05/25/2017 1042 - Progressing by Harlin Heys, RN Clinical Measurements: Ability to maintain clinical measurements within normal limits will improve 05/25/2017 1042 - Progressing by Harlin Heys, RN Will remain free from infection 05/25/2017 1042 - Progressing by Harlin Heys, RN Diagnostic test results will improve 05/25/2017 1042 - Progressing by Harlin Heys, RN Respiratory complications will improve 05/25/2017 1042 - Progressing by Harlin Heys, RN Cardiovascular complication will be avoided 05/25/2017 1042 - Progressing by Harlin Heys, RN Activity: Risk for activity intolerance will decrease 05/25/2017 1042 - Progressing by Harlin Heys, RN Coping: Level of anxiety will decrease 05/25/2017 1042 - Progressing by Harlin Heys, RN Elimination: Will not experience complications related to bowel motility 05/25/2017 1042 - Progressing by Harlin Heys, RN Will not experience complications related to urinary retention 05/25/2017 1042 - Progressing by Harlin Heys, RN Pain Managment: General experience of comfort will improve 05/25/2017 1042 - Progressing by Harlin Heys, RN Safety: Ability to remain free from injury will improve 05/25/2017 1042 - Progressing by Harlin Heys, RN Skin Integrity: Risk for impaired skin integrity will decrease 05/25/2017 1042 - Progressing by Harlin Heys, RN Education: Knowledge of disease or condition will improve 05/25/2017 1042 - Progressing by Harlin Heys, RN Knowledge of secondary prevention will improve 05/25/2017 1042 - Progressing by Harlin Heys, RN Knowledge of patient specific risk factors addressed and post discharge goals established will improve 05/25/2017 1042 - Progressing by  Harlin Heys, RN Coping: Will verbalize positive feelings about self 05/25/2017 1042 - Progressing by Harlin Heys, RN Will identify appropriate support needs 05/25/2017 1042 - Progressing by Harlin Heys, [place] Behavior/Discharge Planning: Ability to manage health-related needs will improve 05/25/2017 1042 - Progressing by Harlin Heys, RN Self-Care: Ability to participate in self-care as condition permits will improve 05/25/2017 1042 - Progressing by Harlin Heys, RN Verbalization of feelings and concerns over difficulty with self-care will improve 05/25/2017 1042 - Progressing by Harlin Heys, RN Ability to communicate needs accurately will improve 05/25/2017 1042 - Progressing by Harlin Heys, RN Nutrition: Risk of aspiration will decrease 05/25/2017 1042 - Progressing by Harlin Heys, RN Dietary intake will improve 05/25/2017 1042 - Progressing by Harlin Heys, RN Intracerebral Hemorrhage Tissue Perfusion: Complications of Intracerebral Hemorrhage will be minimized 05/25/2017 1042 - Progressing by Harlin Heys, RN

## 2017-05-25 NOTE — Progress Notes (Signed)
Occupational Therapy Treatment Patient Details Name: Daniel [name] MRN: [id number] DOB: 07/29/1960 Today's Date: 05/25/2017    History of present illness 57 yo admitted with L ICH with right weakness. PMHx: DM, HTN, HLD, cocaine use   OT comments  Pt supine in bed and agreeable to OT intervention within the room. Pt performed bed mobility with increased time. Pt ambulated to bathroom with RW with overall supervision for ambulating, functional transfer onto standard toilet, and clothing management. Pt standing at sink for hand hygiene with close supervision and pt returned to bed.Pt required mod multimodal cues for orientation to location and time. Pt's caregivers arriving at end of session and OT educated pt and caregivers on secondary stroke risk and symptoms.Questions asked as needed and they verbalized understanding. Pt remained in bed with all needs within reach upon exiting the room.    Follow Up Recommendations  Home health OT;Supervision/Assistance - 24 hour    Equipment Recommendations  None recommended by OT    Recommendations for Other Services      Precautions / Restrictions Precautions Precautions: Fall Restrictions Weight Bearing Restrictions: No       Mobility Bed Mobility Overal bed mobility: Modified Independent      General bed mobility comments: +rail, increased time  Transfers Overall transfer level: Needs assistance Equipment used: Ambulation equipment used Transfers: Sit to/from Stand Sit to Stand: Supervision    General transfer comment: cues for safety and hand placement    Balance Overall balance assessment: Needs assistance Sitting-balance support: No upper extremity supported;Feet supported Sitting balance-Leahy Scale: Good     Standing balance support: Bilateral upper extremity supported;During functional activity Standing balance-Leahy Scale: Fair Standing balance comment: RW       ADL either performed or assessed with clinical  judgement   ADL Overall ADL's : Needs assistance/impaired           Toileting- Clothing Manipulation and Hygiene: Supervision/safety;Sit to/from stand       Functional mobility during ADLs: Supervision/safety;Rolling walker General ADL Comments: supervision with RW for overall functional mobility and tasks this session.      Vision Baseline Vision/History: No visual deficits Patient Visual Report: No change from baseline            Cognition Arousal/Alertness: Awake/alert Behavior During Therapy: WFL for tasks assessed/performed Overall Cognitive Status: Impaired/Different from baseline Area of Impairment: Orientation;Attention;Memory     Orientation Level: Disoriented to;Time Current Attention Level: Selective Memory: Decreased short-term memory         General Comments: pt  not oriented to month, year, or place.                    Pertinent Vitals/ Pain       Pain Assessment: No/denies pain         Frequency  Min 3X/week        Progress Toward Goals  OT Goals(current goals can now be found in the care plan section)  Progress towards OT goals: Progressing toward goals  Acute Rehab OT Goals Patient Stated Goal: return home OT Goal Formulation: With patient Time For Goal Achievement: 06/08/17 Potential to Achieve Goals: Good  Plan Discharge plan needs to be updated;Other (comment)(Home Health OT services, 24/7 Supervision for safety)    Co-evaluation    PT/OT/SLP Co-Evaluation/Treatment: Yes        AM-PAC PT "6 Clicks" Daily Activity     Outcome Measure   Help from another person eating meals?: None Help from another person taking  care of personal grooming?: A Little Help from another person toileting, which includes using toliet, bedpan, or urinal?: A Little Help from another person bathing (including washing, rinsing, drying)?: A Little Help from another person to put on and taking off regular upper body clothing?: A Little Help  from another person to put on and taking off regular lower body clothing?: A Little 6 Click Score: 19    End of Session Equipment Utilized During Treatment: Rolling walker  OT Visit Diagnosis: Unsteadiness on feet (R26.81);Other abnormalities of gait and mobility (R26.89);Cognitive communication deficit (R41.841) Symptoms and signs involving cognitive functions: Nontraumatic intracerebral hemorrhage   Activity Tolerance Patient tolerated treatment well   Patient Left with family/visitor present;in bed;with call bell/phone within reach           Time: 8185-9093 OT Time Calculation (min): 18 min  Charges: OT General Charges $OT Visit: 1 Visit OT Treatments $Self Care/Home Management : 8-22 mins   Gypsy Decant 05/25/2017, 2:33 PM

## 2017-05-25 NOTE — Progress Notes (Signed)
Physical Therapy Treatment Patient Details Name: Daniel [name] MRN: [id number] DOB: Feb 11, 1961 Today's Date: 05/25/2017    History of Present Illness 57 yo admitted with L ICH with right weakness. PMHx: DM, HTN, HLD, cocaine use    PT Comments    Pt received in bed and agreeable to therapy. Pt stood at sink with min guard assist to brush his teeth. Hallway ambulation x 300 feet with RW and min guard assist. Pt continues to demonstrate decreased safety awareness, decreased cognition and balance deficits. Current POC remains appropriate.    Follow Up Recommendations  SNF;Supervision/Assistance - 24 hour     Equipment Recommendations  Rolling walker with 5" wheels    Recommendations for Other Services       Precautions / Restrictions Precautions Precautions: Fall Restrictions Weight Bearing Restrictions: No    Mobility  Bed Mobility Overal bed mobility: Modified Independent             General bed mobility comments: +rail, increased time  Transfers Overall transfer level: Needs assistance Equipment used: Ambulation equipment used   Sit to Stand: Min guard         General transfer comment: cues for safety, increased time to stabilize initial standing balance  Ambulation/Gait Ambulation/Gait assistance: Min guard Ambulation Distance (Feet): 300 Feet Assistive device: Rolling walker (2 wheeled) Gait Pattern/deviations: Step-through pattern Gait velocity: mildly decreased Gait velocity interpretation: Below normal speed for age/gender General Gait Details: cues for maneuvering around obstacles   Stairs            Wheelchair Mobility    Modified Rankin (Stroke Patients Only) Modified Rankin (Stroke Patients Only) Pre-Morbid Rankin Score: No symptoms Modified Rankin: Slight disability     Balance Overall balance assessment: Needs assistance Sitting-balance support: No upper extremity supported;Feet supported Sitting balance-Leahy Scale:  Good     Standing balance support: Bilateral upper extremity supported;During functional activity Standing balance-Leahy Scale: Fair                              Cognition Arousal/Alertness: Awake/alert Behavior During Therapy: WFL for tasks assessed/performed Overall Cognitive Status: Impaired/Different from baseline Area of Impairment: Orientation;Attention;Memory                 Orientation Level: Disoriented to;Time Current Attention Level: Selective Memory: Decreased short-term memory                Exercises      General Comments        Pertinent Vitals/Pain Pain Assessment: No/denies pain    Home Living                      Prior Function            PT Goals (current goals can now be found in the care plan section) Acute Rehab PT Goals Patient Stated Goal: return home PT Goal Formulation: With patient Time For Goal Achievement: 06/05/17 Potential to Achieve Goals: Fair Progress towards PT goals: Progressing toward goals    Frequency    Min 3X/week      PT Plan Current plan remains appropriate    Co-evaluation              AM-PAC PT "6 Clicks" Daily Activity  Outcome Measure  Difficulty turning over in bed (including adjusting bedclothes, sheets and blankets)?: None Difficulty moving from lying on back to sitting on the side of the bed? : A  Little Difficulty sitting down on and standing up from a chair with arms (e.g., wheelchair, bedside commode, etc,.)?: A Little Help needed moving to and from a bed to chair (including a wheelchair)?: A Little Help needed walking in hospital room?: A Little Help needed climbing 3-5 steps with a railing? : A Little 6 Click Score: 19    End of Session Equipment Utilized During Treatment: Gait belt Activity Tolerance: Patient tolerated treatment well Patient left: in chair;with call bell/phone within reach;with chair alarm set Nurse Communication: Mobility status PT  Visit Diagnosis: Other abnormalities of gait and mobility (R26.89);Other symptoms and signs involving the nervous system (R29.898)     Time: 1010-1026 PT Time Calculation (min) (ACUTE ONLY): 16 min  Charges:  $Gait Training: 8-22 mins                    G Codes:       Lorrin Goodell, PT  Office # [phone] Pager [phone]    Lorriane Shire 05/25/2017, 10:47 AM

## 2017-05-27 ENCOUNTER — Emergency Department (HOSPITAL_COMMUNITY): Payer: Self-pay

## 2017-05-27 ENCOUNTER — Encounter (HOSPITAL_COMMUNITY): Payer: Self-pay | Admitting: *Deleted

## 2017-05-27 ENCOUNTER — Emergency Department (HOSPITAL_COMMUNITY)
Admission: EM | Admit: 2017-05-27 | Discharge: 2017-05-27 | Disposition: A | Discharge status: Home / Self Care | Payer: Self-pay | Attending: Emergency Medicine | Admitting: Emergency Medicine

## 2017-05-27 ENCOUNTER — Other Ambulatory Visit: Payer: Self-pay

## 2017-05-27 DIAGNOSIS — Y9389 Activity, other specified: Secondary | ICD-10-CM | POA: Insufficient documentation

## 2017-05-27 DIAGNOSIS — Z7984 Long term (current) use of oral hypoglycemic drugs: Secondary | ICD-10-CM | POA: Insufficient documentation

## 2017-05-27 DIAGNOSIS — M5441 Lumbago with sciatica, right side: Secondary | ICD-10-CM | POA: Insufficient documentation

## 2017-05-27 DIAGNOSIS — Y33XXXA Other specified events, undetermined intent, initial encounter: Secondary | ICD-10-CM | POA: Insufficient documentation

## 2017-05-27 DIAGNOSIS — E119 Type 2 diabetes mellitus without complications: Secondary | ICD-10-CM | POA: Insufficient documentation

## 2017-05-27 DIAGNOSIS — F17210 Nicotine dependence, cigarettes, uncomplicated: Secondary | ICD-10-CM | POA: Insufficient documentation

## 2017-05-27 DIAGNOSIS — R519 Headache, unspecified: Secondary | ICD-10-CM

## 2017-05-27 DIAGNOSIS — Y92512 Supermarket, store or market as the place of occurrence of the external cause: Secondary | ICD-10-CM | POA: Insufficient documentation

## 2017-05-27 DIAGNOSIS — S39012A Strain of muscle, fascia and tendon of lower back, initial encounter: Secondary | ICD-10-CM | POA: Insufficient documentation

## 2017-05-27 DIAGNOSIS — R51 Headache: Secondary | ICD-10-CM | POA: Insufficient documentation

## 2017-05-27 DIAGNOSIS — I10 Essential (primary) hypertension: Secondary | ICD-10-CM | POA: Insufficient documentation

## 2017-05-27 DIAGNOSIS — Z79899 Other long term (current) drug therapy: Secondary | ICD-10-CM | POA: Insufficient documentation

## 2017-05-27 DIAGNOSIS — Y998 Other external cause status: Secondary | ICD-10-CM | POA: Insufficient documentation

## 2017-05-27 DIAGNOSIS — Z8673 Personal history of transient ischemic attack (TIA), and cerebral infarction without residual deficits: Secondary | ICD-10-CM | POA: Insufficient documentation

## 2017-05-27 DIAGNOSIS — M5442 Lumbago with sciatica, left side: Secondary | ICD-10-CM | POA: Insufficient documentation

## 2017-05-27 LAB — URINALYSIS, ROUTINE W REFLEX MICROSCOPIC
Bilirubin Urine: NEGATIVE
Glucose, UA: NEGATIVE mg/dL
Hgb urine dipstick: NEGATIVE
KETONES UR: NEGATIVE mg/dL
LEUKOCYTES UA: NEGATIVE
Nitrite: NEGATIVE
PH: 5 (ref 5.0–8.0)
PROTEIN: 100 mg/dL — AB
SQUAMOUS EPITHELIAL / LPF: NONE SEEN
Specific Gravity, Urine: 1.011 (ref 1.005–1.030)

## 2017-05-27 LAB — COMPREHENSIVE METABOLIC PANEL
ALT: 48 U/L (ref 17–63)
AST: 66 U/L — AB (ref 15–41)
Albumin: 3.3 g/dL — ABNORMAL LOW (ref 3.5–5.0)
Alkaline Phosphatase: 49 U/L (ref 38–126)
Anion gap: 10 (ref 5–15)
BUN: 23 mg/dL — AB (ref 6–20)
CHLORIDE: 108 mmol/L (ref 101–111)
CO2: 20 mmol/L — AB (ref 22–32)
CREATININE: 1.62 mg/dL — AB (ref 0.61–1.24)
Calcium: 8.7 mg/dL — ABNORMAL LOW (ref 8.9–10.3)
GFR calc Af Amer: 53 mL/min — ABNORMAL LOW (ref >60)
GFR, EST NON AFRICAN AMERICAN: 46 mL/min — AB (ref >60)
Glucose, Bld: 137 mg/dL — ABNORMAL HIGH (ref 65–99)
POTASSIUM: 4 mmol/L (ref 3.5–5.1)
SODIUM: 138 mmol/L (ref 135–145)
Total Bilirubin: 0.7 mg/dL (ref 0.3–1.2)
Total Protein: 7.6 g/dL (ref 6.5–8.1)

## 2017-05-27 LAB — CBC
HEMATOCRIT: 39.8 % (ref 39.0–52.0)
Hemoglobin: 13.5 g/dL (ref 13.0–17.0)
MCH: 35.1 pg — AB (ref 26.0–34.0)
MCHC: 33.9 g/dL (ref 30.0–36.0)
MCV: 103.4 fL — AB (ref 78.0–100.0)
PLATELETS: 106 10*3/uL — AB (ref 150–400)
RBC: 3.85 MIL/uL — ABNORMAL LOW (ref 4.22–5.81)
RDW: 12.9 % (ref 11.5–15.5)
WBC: 8.4 10*3/uL (ref 4.0–10.5)

## 2017-05-27 LAB — PROTIME-INR
INR: 0.92
Prothrombin Time: 12.3 seconds (ref 11.4–15.2)

## 2017-05-27 MED ORDER — OXYCODONE HCL 5 MG PO TABS: 5.0000 mg | ORAL_TABLET | ORAL | 0 refills | Status: DC | PRN

## 2017-05-27 MED ORDER — HYDROMORPHONE HCL 1 MG/ML IJ SOLN
1.0000 mg | Freq: Once | INTRAMUSCULAR | Status: AC
  Administered 2017-05-27: 1 mg via INTRAVENOUS
  Filled 2017-05-27: qty 1

## 2017-05-27 NOTE — ED Provider Notes (Signed)
[facility]PART[facility] Provider Note   CSN: [id number] Arrival date & time: 05/27/17  1455     History   Chief Complaint Chief Complaint  Patient presents with  . Back Pain    HPI Daniel [name] is a 57 y.o. male.  The history is provided by the patient and medical records. No language interpreter was used.  Headache   This is a new problem. The current episode started more than 2 days ago. The problem occurs constantly. The problem has not changed since onset.The headache is associated with nothing. The quality of the pain is described as sharp and dull. The pain is at a severity of 3/10. The pain is mild. The pain does not radiate. Pertinent negatives include no fever, no malaise/fatigue, no palpitations, no shortness of breath, no nausea and no vomiting. He has tried nothing for the symptoms. The treatment provided no relief.  Back Pain   This is a new problem. The current episode started 6 to 12 hours ago. The problem occurs constantly. The problem has not changed since onset.Associated with: Bent over to pick up something in a grocery store. The pain is present in the lumbar spine. The quality of the pain is described as stabbing and shooting. The pain radiates to the left thigh and right thigh. The pain is at a severity of 10/10. The pain is severe. The symptoms are aggravated by bending, twisting and certain positions. The pain is the same all the time. Associated symptoms include headaches. Pertinent negatives include no chest pain, no fever, no numbness, no dysuria and no weakness. He has tried nothing for the symptoms.    Past Medical History:  Diagnosis Date  . Diabetes mellitus without complication ([place])   . Hypertension   . Shoulder pain, left 06/28/2013    Patient Active Problem List   Diagnosis Date Noted  . ICH (intracerebral hemorrhage) ([place]) 05/20/2017  . Cellulitis of abdominal wall 06/29/2016  . Abscess of abdominal wall  06/29/2016  . Hypertension 06/29/2016  . DM2 (diabetes mellitus, type 2) ([place]) 06/29/2016  . Sepsis ([place]) 06/29/2016    Past Surgical History:  Procedure Laterality Date  . APPENDECTOMY    . COLONOSCOPY  01/23/2012   Procedure: COLONOSCOPY;  Surgeon: Danie Binder, MD;  Location: [facility];  Service: Endoscopy;  Laterality: N/A;  11:10 AM  . INCISION AND DRAINAGE ABSCESS N/A 06/29/2016   Procedure: INCISION AND DRAINAGE ABDOMINAL WALL ABSCESS;  Surgeon: Alphonsa Overall, MD;  Location: [facility] ORS;  Service: General;  Laterality: N/A;  . Left heel surgery         Home Medications    Prior to Admission medications   Medication Sig Start Date End Date Taking? Authorizing Provider  amLODipine (NORVASC) 10 MG tablet Take 1 tablet (10 mg total) by mouth daily. 05/26/17   Mary Sella, NP  blood glucose meter kit and supplies KIT Dispense based on patient and insurance preference. Use up to four times daily as directed. (FOR ICD-9 250.00, 250.01). 07/01/16   Nita Sells, MD  cloNIDine (CATAPRES) 0.2 MG tablet Take 1 tablet (0.2 mg total) by mouth 3 (three) times daily. 05/25/17   Mary Sella, NP  gabapentin (NEURONTIN) 100 MG capsule Take 1 capsule (100 mg total) by mouth 2 (two) times daily. 05/25/17   Mary Sella, NP  hydrALAZINE (APRESOLINE) 50 MG tablet Take 1 tablet (50 mg total) by mouth every 8 (eight) hours. 05/25/17   Candise Che  A, NP  metFORMIN (GLUCOPHAGE) 500 MG tablet Take 1 tablet (500 mg total) by mouth daily with breakfast. Patient not taking: Reported on 05/21/2017 07/01/16   Nita Sells, MD  vitamin B-12 500 MCG tablet Take 1 tablet (500 mcg total) by mouth daily. 05/26/17   Mary Sella, NP    Family History Family History  Problem Relation Age of Onset  . Colon cancer Neg Hx     Social History Social History   Tobacco Use  . Smoking status: Current Every Day Smoker    Packs/day: 1.00    Years: 30.00    Pack years: 30.00    Types:  Cigarettes  . Smokeless tobacco: Never Used  Substance Use Topics  . Alcohol use: Yes    Alcohol/week: 7.2 oz    Types: 12 Cans of beer per week  . Drug use: Yes    Types: "Crack" cocaine     Allergies   Patient has no known allergies.   Review of Systems Review of Systems  Constitutional: Negative for chills, diaphoresis, fatigue, fever and malaise/fatigue.  HENT: Negative for congestion.   Eyes: Negative for visual disturbance.  Respiratory: Negative for chest tightness, shortness of breath, wheezing and stridor.   Cardiovascular: Negative for chest pain and palpitations.  Gastrointestinal: Negative for diarrhea, nausea and vomiting.  Genitourinary: Negative for dysuria.  Musculoskeletal: Positive for back pain. Negative for neck pain and neck stiffness.  Neurological: Positive for headaches. Negative for seizures, speech difficulty, weakness, light-headedness and numbness.  Psychiatric/Behavioral: Negative for agitation.  All other systems reviewed and are negative.    Physical Exam Updated Vital Signs BP (!) 164/89 (BP Location: Left Arm)   Pulse 65   Temp 98.7 F (37.1 C) (Oral)   Resp 18   SpO2 100%   Physical Exam  Constitutional: He is oriented to person, place, and time. He appears well-developed and well-nourished. No distress.  HENT:  Head: Normocephalic.  Mouth/Throat: Oropharynx is clear and moist. No oropharyngeal exudate.  Eyes: Conjunctivae and EOM are normal. Right pupil is round and reactive. Left pupil is round and reactive.    Neck: Normal range of motion.  Cardiovascular: Normal rate and intact distal pulses.  No murmur heard. Pulmonary/Chest: Effort normal. He has no wheezes. He exhibits no tenderness.  Abdominal: Soft. He exhibits no distension. There is no tenderness.  Musculoskeletal: He exhibits tenderness. He exhibits no edema.       Lumbar back: He exhibits tenderness, pain and spasm.       Back:  Neurological: He is alert and  oriented to person, place, and time. He is not disoriented. No sensory deficit. He exhibits normal muscle tone. Coordination normal. GCS eye subscore is 4. GCS verbal subscore is 5. GCS motor subscore is 6.  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  Numbness in bilateral feet.  Normal strength in bilateral legs.  Normal sensation and strength in upper extremities.  Skin: Capillary refill takes less than 2 seconds. He is not diaphoretic. No erythema. No pallor.  Psychiatric: He has a normal mood and affect.  Nursing note and vitals reviewed.    ED Treatments / Results  Labs (all labs ordered are listed, but only abnormal results are displayed) Labs Reviewed  COMPREHENSIVE METABOLIC PANEL - Abnormal; Notable for the following components:      Result Value   CO2 20 (*)    Glucose, Bld 137 (*)    BUN 23 (*)    Creatinine, Ser 1.62 (*)  Calcium 8.7 (*)    Albumin 3.3 (*)    AST 66 (*)    GFR calc non Af Amer 46 (*)    GFR calc Af Amer 53 (*)    All other components within normal limits  CBC - Abnormal; Notable for the following components:   RBC 3.85 (*)    MCV 103.4 (*)    MCH 35.1 (*)    Platelets 106 (*)    All other components within normal limits  URINALYSIS, ROUTINE W REFLEX MICROSCOPIC - Abnormal; Notable for the following components:   Protein, ur 100 (*)    Bacteria, UA RARE (*)    All other components within normal limits  PROTIME-INR    EKG  EKG Interpretation None       Radiology Ct Head Wo Contrast  Result Date: 05/27/2017 CLINICAL DATA:  Intracerebral hemorrhage EXAM: CT HEAD WITHOUT CONTRAST TECHNIQUE: Contiguous axial images were obtained from the base of the skull through the vertex without intravenous contrast. COMPARISON:  05/21/2017 FINDINGS: Brain: Again noted is the hemorrhage within the left thalamus, unchanged. No new hemorrhage or acute infarction. No hydrocephalus, mass effect or midline shift. Previously seen blood within the left lateral  ventricle occipital horn has decreased. Vascular: No hyperdense vessel or unexpected calcification. Skull: No acute calvarial abnormality. Sinuses/Orbits: Visualized paranasal sinuses and mastoids clear. Orbital soft tissues unremarkable. Other: None IMPRESSION: Stable left thalamic hemorrhage. Decreasing intraventricular blood within the left lateral ventricle. No new hemorrhage or acute finding. Electronically Signed   By: Rolm Baptise M.D.   On: 05/27/2017 18:35   Ct Lumbar Spine Wo Contrast  Result Date: 05/27/2017 CLINICAL DATA:  Acute back pain. EXAM: CT LUMBAR SPINE WITHOUT CONTRAST TECHNIQUE: Multidetector CT imaging of the lumbar spine was performed without intravenous contrast administration. Multiplanar CT image reconstructions were also generated. COMPARISON:  CT abdomen 06/29/2016 FINDINGS: Segmentation: 5 lumbar type vertebral bodies. Alignment: Normal Vertebrae: No fracture or primary bone lesion. Paraspinal and other soft tissues: Gallstone noted. Aortic atherosclerosis noted. Disc levels: T11-12: Mild disc bulge.  No stenosis. T12-L1: Chronic disc degeneration with vacuum phenomenon. Mild bulging of the disc. No compressive stenosis. Similar appearance to the CT study of last year. L1-2: Normal disc.  Mild facet hypertrophy.  No stenosis. L2-3: Mild disc bulge.  No stenosis. L3-4: Moderate disc bulge. Mild facet hypertrophy. Mild stenosis of both lateral recesses without definite neural compression. L4-5: Bulging of the disc more towards the right. Mild facet and ligamentous prominence. Mild narrowing of the lateral recesses without visible neural compression. L5-S1: Mild bulging of the disc. Mild facet arthritis. No stenosis. 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 IMPRESSION: Mild chronic degenerative changes throughout the region without evidence of advanced disease or definite neural compression. Findings could contribute to back pain. No acute finding by CT. Electronically  Signed   By: Nelson Chimes M.D.   On: 05/27/2017 18:37    Procedures Procedures (including critical care time)  Medications Ordered in ED Medications  HYDROmorphone (DILAUDID) injection 1 mg (1 mg Intravenous Given 05/27/17 1801)     Initial Impression / Assessment and Plan / ED Course  I have reviewed the triage vital signs and the nursing notes.  Pertinent labs & imaging results that were available during my care of the patient were reviewed by me and considered in my medical decision making (see chart for details).     ORRIN YURKOVICH is a 57 y.o. male with a past medical history significant for hypertension,  diabetes, and discharged 2 days ago from an admission for hemorrhagic stroke who presents with continued headaches, bilateral foot numbness, and severe low back pain.  Patient reports that he was discharged 2 days ago from an admission for hemorrhagic stroke.  Patient says that he has been taking his medications as directed and has not been checking his blood pressure.  He says that he has had continued headaches ever since discharge that is now in the 3-4 range in severity.  He describes no nausea, vomiting, or vision changes but does report that he is developed numbness in his bilateral lower extremities.  He reports this is new.  He says that this morning, all the grocery store he bent over to pick up something and had sudden onset of 10 out of 10 low back pain.  He reports the pain does radiate down his bilateral legs.  He denies any loss of bowel or bladder function.  He denies any other trauma.  He denies fevers, chills, chest pain, shortness of breath, constipation, diarrhea or dysuria.  He denies any upper extremity problems.  Next  On exam, patient does have anisocoria with his left pupil larger than his right.  Patient has no facial droop or sensation of normality in the face or arms.  Patient has symmetric grip strength and normal finger nose finger testing bilaterally.   Patient had normal strength in bilateral lower extreme knees but had reported numbness in both of his feet which she reports is new.  Patient had diffuse tenderness across his low back both in the midline and on the lateral paraspinal areas.  No CVA tenderness.  No upper back or neck tenderness.  No abdominal tenderness.  Patient had symmetric pulses in bilateral lower extremities.  Given patient's report of continued headache with what appears to be a new neurologic deficit of anisocoria, patient will have repeat head CT as he his blood pressure has been monitored at home.  Patient blood pressures in the 160s on arrival.  Next  Patient will have workup including a CT of the lumbar spine to look for severe abnormalities in the setting of his new numbness and her low back pain.  Patient was given pain medicine and have screening laboratory testing performed.  Next  Anticipate reassessment after workup.  8:31 PM Patient reports his back pain and headache have improved after pain medication.  CT imaging of the head showed no acute changes of his hemorrhagic stroke.  No evidence of new mass-effect or rebleed.  There appears to be improvement in the amount of blood.  CT of the back showed an no evidence of acute abnormality however there was degenerative disease likely causing his pain.  Patient was able to ambulate around the room without difficulty and felt his pain was improved.    Laboratory testing showed slight increase in kidney function but otherwise is reassuring.    Given patient's improvement in symptoms, I suspect this is a musculoskeletal type back pain.  Patient was given prescription for pain medication and follow-up with his PCP in several days.  Strict return precautions were given and understood and patient was encouraged to take the blood pressure medicine to prevent further intracranial hemorrhage.  Patient voiced understanding of plan of care as well as follow-up instructions.   Patient had no other questions or concerns and was discharged in good condition with improving symptoms.   Final Clinical Impressions(s) / ED Diagnoses   Final diagnoses:  Acute bilateral low back pain with  bilateral sciatica  Strain of lumbar region, initial encounter  Nonintractable headache, unspecified chronicity pattern, unspecified headache type    ED Discharge Orders        Ordered    oxyCODONE (ROXICODONE) 5 MG immediate release tablet  Every 4 hours PRN     05/27/17 2033      Clinical Impression: 1. Acute bilateral low back pain with bilateral sciatica   2. Strain of lumbar region, initial encounter   3. Nonintractable headache, unspecified chronicity pattern, unspecified headache type     Disposition: Discharge  Condition: Good  I have discussed the results, Dx and Tx plan with the pt(& family if present). He/she/they expressed understanding and agree(s) with the plan. Discharge instructions discussed at great length. Strict return precautions discussed and pt &/or family have verbalized understanding of the instructions. No further questions at time of discharge.    New Prescriptions   OXYCODONE (ROXICODONE) 5 MG IMMEDIATE RELEASE TABLET    Take 1 tablet (5 mg total) by mouth every 4 (four) hours as needed for severe pain.    Follow Up: [place] [id number] [phone] Schedule an appointment as soon as possible for a visit    [facility] [address] [id number] [place]       Tegeler, Gwenyth Allegra, MD 05/27/17 2213

## 2017-05-27 NOTE — ED Triage Notes (Signed)
Pt reports recent discharge from hospital for same. Pt had onset of severe lower back pain today. Denies weakness.

## 2017-05-27 NOTE — Discharge Instructions (Signed)
Your imaging today showed no evidence of acute changes.  We suspect her back pain was due to a musculoskeletal sprain when you bent over at the grocery store.  Please use the pain medicine to help with your symptoms.  Please stay hydrated.  Please follow-up with your PCP in the next several days and use the blood pressure medicine as previous as directed.  If any symptoms change or worsen, please return to the nearest emergency department.

## 2017-05-27 NOTE — ED Notes (Signed)
ED Provider at bedside. 

## 2017-05-29 ENCOUNTER — Emergency Department (HOSPITAL_COMMUNITY)
Admission: EM | Admit: 2017-05-29 | Discharge: 2017-05-29 | Disposition: A | Discharge status: Home / Self Care | Payer: Self-pay | Attending: Emergency Medicine | Admitting: Emergency Medicine

## 2017-05-29 ENCOUNTER — Emergency Department (HOSPITAL_COMMUNITY): Payer: Self-pay

## 2017-05-29 ENCOUNTER — Other Ambulatory Visit: Payer: Self-pay

## 2017-05-29 ENCOUNTER — Encounter (HOSPITAL_COMMUNITY): Payer: Self-pay | Admitting: *Deleted

## 2017-05-29 DIAGNOSIS — Y33XXXD Other specified events, undetermined intent, subsequent encounter: Secondary | ICD-10-CM | POA: Insufficient documentation

## 2017-05-29 DIAGNOSIS — F17210 Nicotine dependence, cigarettes, uncomplicated: Secondary | ICD-10-CM | POA: Insufficient documentation

## 2017-05-29 DIAGNOSIS — Z7984 Long term (current) use of oral hypoglycemic drugs: Secondary | ICD-10-CM | POA: Insufficient documentation

## 2017-05-29 DIAGNOSIS — S39012D Strain of muscle, fascia and tendon of lower back, subsequent encounter: Secondary | ICD-10-CM | POA: Insufficient documentation

## 2017-05-29 DIAGNOSIS — I10 Essential (primary) hypertension: Secondary | ICD-10-CM | POA: Insufficient documentation

## 2017-05-29 DIAGNOSIS — R32 Unspecified urinary incontinence: Secondary | ICD-10-CM | POA: Insufficient documentation

## 2017-05-29 DIAGNOSIS — E119 Type 2 diabetes mellitus without complications: Secondary | ICD-10-CM | POA: Insufficient documentation

## 2017-05-29 DIAGNOSIS — Z79899 Other long term (current) drug therapy: Secondary | ICD-10-CM | POA: Insufficient documentation

## 2017-05-29 DIAGNOSIS — M5126 Other intervertebral disc displacement, lumbar region: Secondary | ICD-10-CM | POA: Insufficient documentation

## 2017-05-29 DIAGNOSIS — R200 Anesthesia of skin: Secondary | ICD-10-CM | POA: Insufficient documentation

## 2017-05-29 MED ORDER — CYCLOBENZAPRINE HCL 10 MG PO TABS: 10.0000 mg | ORAL_TABLET | Freq: Two times a day (BID) | ORAL | 0 refills | Status: DC | PRN

## 2017-05-29 MED ORDER — HYDROMORPHONE HCL 1 MG/ML IJ SOLN
1.0000 mg | Freq: Once | INTRAMUSCULAR | Status: AC
  Administered 2017-05-29: 1 mg via INTRAVENOUS
  Filled 2017-05-29: qty 1

## 2017-05-29 MED ORDER — AMLODIPINE BESYLATE 5 MG PO TABS
10.0000 mg | ORAL_TABLET | Freq: Once | ORAL | Status: AC
Start: 2017-05-29 — End: 2017-05-29
  Administered 2017-05-29: 10 mg via ORAL
  Filled 2017-05-29: qty 2

## 2017-05-29 NOTE — ED Notes (Signed)
Called pt to take to room. No answer

## 2017-05-29 NOTE — ED Provider Notes (Signed)
[facility] EMERGENCY DEPARTMENT Provider Note   CSN: [id number] Arrival date & time: 05/29/17  1114     History   Chief Complaint Chief Complaint  Patient presents with  . Back Pain    HPI ETHER WOLTERS is a 57 y.o. male with a past medical history of hypertension, diabetes, recent hospitalization for ICH, presents to ED the second time this week for evaluation of low back pain.  He was seen and evaluated here 2 days ago when CT of head, lumbar spine were done with no acute abnormalities.  Note states that he felt better after pain medication was given, was able to ambulate with no issues.  He was discharged home with a few doses of pain medication to take at home.  He tells me that his pain did not get any better and is actually gotten worse since his discharge 2 days ago.  He has been ambulatory since pain began but cannot recall any inciting incident that brought the pain on.  He reports a history of chronic back pain but states that this feels different.  Pain is located in the lower part of his back and reports tingling sensation down both of the back of his legs.  HPI  Past Medical History:  Diagnosis Date  . Diabetes mellitus without complication ([place])   . Hypertension   . Shoulder pain, left 06/28/2013    Patient Active Problem List   Diagnosis Date Noted  . ICH (intracerebral hemorrhage) ([place]) 05/20/2017  . Cellulitis of abdominal wall 06/29/2016  . Abscess of abdominal wall 06/29/2016  . Hypertension 06/29/2016  . DM2 (diabetes mellitus, type 2) ([place]) 06/29/2016  . Sepsis ([place]) 06/29/2016    Past Surgical History:  Procedure Laterality Date  . APPENDECTOMY    . COLONOSCOPY  01/23/2012   Procedure: COLONOSCOPY;  Surgeon: Danie Binder, MD;  Location: [facility] SUITE;  Service: Endoscopy;  Laterality: N/A;  11:10 AM  . INCISION AND DRAINAGE ABSCESS N/A 06/29/2016   Procedure: INCISION AND DRAINAGE ABDOMINAL WALL ABSCESS;  Surgeon: Alphonsa Overall, MD;   Location: [facility] ORS;  Service: General;  Laterality: N/A;  . Left heel surgery         Home Medications    Prior to Admission medications   Medication Sig Start Date End Date Taking? Authorizing Provider  amLODipine (NORVASC) 10 MG tablet Take 1 tablet (10 mg total) by mouth daily. 05/26/17   Mary Sella, NP  blood glucose meter kit and supplies KIT Dispense based on patient and insurance preference. Use up to four times daily as directed. (FOR ICD-9 250.00, 250.01). Patient not taking: Reported on 05/27/2017 07/01/16   Nita Sells, MD  cloNIDine (CATAPRES) 0.2 MG tablet Take 1 tablet (0.2 mg total) by mouth 3 (three) times daily. 05/25/17   Mary Sella, NP  gabapentin (NEURONTIN) 100 MG capsule Take 1 capsule (100 mg total) by mouth 2 (two) times daily. 05/25/17   Mary Sella, NP  hydrALAZINE (APRESOLINE) 50 MG tablet Take 1 tablet (50 mg total) by mouth every 8 (eight) hours. 05/25/17   Mary Sella, NP  metFORMIN (GLUCOPHAGE) 500 MG tablet Take 1 tablet (500 mg total) by mouth daily with breakfast. Patient not taking: Reported on 05/21/2017 07/01/16   Nita Sells, MD  oxyCODONE (ROXICODONE) 5 MG immediate release tablet Take 1 tablet (5 mg total) by mouth every 4 (four) hours as needed for severe pain. 05/27/17   Tegeler, Gwenyth Allegra, MD  vitamin  B-12 500 MCG tablet Take 1 tablet (500 mcg total) by mouth daily. 05/26/17   Mary Sella, NP    Family History Family History  Problem Relation Age of Onset  . Colon cancer Neg Hx     Social History Social History   Tobacco Use  . Smoking status: Current Every Day Smoker    Packs/day: 1.00    Years: 30.00    Pack years: 30.00    Types: Cigarettes  . Smokeless tobacco: Never Used  Substance Use Topics  . Alcohol use: Yes    Alcohol/week: 7.2 oz    Types: 12 Cans of beer per week  . Drug use: Yes    Types: "Crack" cocaine     Allergies   Patient has no known allergies.   Review of  Systems Review of Systems  Constitutional: Negative for appetite change, chills and fever.  HENT: Negative for ear pain, rhinorrhea, sneezing and sore throat.   Eyes: Negative for photophobia and visual disturbance.  Respiratory: Negative for cough, chest tightness, shortness of breath and wheezing.   Cardiovascular: Negative for chest pain and palpitations.  Gastrointestinal: Negative for abdominal pain, blood in stool, constipation, diarrhea, nausea and vomiting.  Genitourinary: Negative for dysuria, hematuria and urgency.  Musculoskeletal: Positive for back pain. Negative for myalgias.  Skin: Negative for rash.  Neurological: Negative for dizziness, weakness and light-headedness.     Physical Exam Updated Vital Signs BP (!) 175/102 (BP Location: Right Arm)   Pulse 92   Temp 98.6 F (37 C) (Oral)   Resp 18   SpO2 98%   Physical Exam  Constitutional: He appears well-developed and well-nourished. No distress.  HENT:  Head: Normocephalic and atraumatic.  Nose: Nose normal.  Eyes: Conjunctivae and EOM are normal. Right eye exhibits no discharge. Left eye exhibits no discharge. No scleral icterus.  Neck: Normal range of motion. Neck supple.  Cardiovascular: Normal rate, regular rhythm, normal heart sounds and intact distal pulses. Exam reveals no gallop and no friction rub.  No murmur heard. Pulmonary/Chest: Effort normal and breath sounds normal. No respiratory distress.  Abdominal: Soft. Bowel sounds are normal. He exhibits no distension. There is no tenderness. There is no guarding.  Musculoskeletal: Normal range of motion. He exhibits tenderness. He exhibits no edema.       Arms: Midline thoracic and lumbar spinal tenderness to palpation. No step-off palpated. No visible bruising, edema or temperature change noted. No objective signs of numbness present. No saddle anesthesia. 2+ DP pulses bilaterally. Sensation intact to light touch. Strength 5/5 in bilateral lower extremities.   Ambulatory with normal gait.  Neurological: He is alert. He exhibits normal muscle tone. Coordination normal.  Skin: Skin is warm and dry. No rash noted.  Psychiatric: He has a normal mood and affect.  Nursing note and vitals reviewed.    ED Treatments / Results  Labs (all labs ordered are listed, but only abnormal results are displayed) Labs Reviewed - No data to display  EKG  EKG Interpretation None       Radiology Ct Head Wo Contrast  Result Date: 05/27/2017 CLINICAL DATA:  Intracerebral hemorrhage EXAM: CT HEAD WITHOUT CONTRAST TECHNIQUE: Contiguous axial images were obtained from the base of the skull through the vertex without intravenous contrast. COMPARISON:  05/21/2017 FINDINGS: Brain: Again noted is the hemorrhage within the left thalamus, unchanged. No new hemorrhage or acute infarction. No hydrocephalus, mass effect or midline shift. Previously seen blood within the left lateral ventricle occipital horn has  decreased. Vascular: No hyperdense vessel or unexpected calcification. Skull: No acute calvarial abnormality. Sinuses/Orbits: Visualized paranasal sinuses and mastoids clear. Orbital soft tissues unremarkable. Other: None IMPRESSION: Stable left thalamic hemorrhage. Decreasing intraventricular blood within the left lateral ventricle. No new hemorrhage or acute finding. Electronically Signed   By: Rolm Baptise M.D.   On: 05/27/2017 18:35   Ct Lumbar Spine Wo Contrast  Result Date: 05/27/2017 CLINICAL DATA:  Acute back pain. EXAM: CT LUMBAR SPINE WITHOUT CONTRAST TECHNIQUE: Multidetector CT imaging of the lumbar spine was performed without intravenous contrast administration. Multiplanar CT image reconstructions were also generated. COMPARISON:  CT abdomen 06/29/2016 FINDINGS: Segmentation: 5 lumbar type vertebral bodies. Alignment: Normal Vertebrae: No fracture or primary bone lesion. Paraspinal and other soft tissues: Gallstone noted. Aortic atherosclerosis noted. Disc  levels: T11-12: Mild disc bulge.  No stenosis. T12-L1: Chronic disc degeneration with vacuum phenomenon. Mild bulging of the disc. No compressive stenosis. Similar appearance to the CT study of last year. L1-2: Normal disc.  Mild facet hypertrophy.  No stenosis. L2-3: Mild disc bulge.  No stenosis. L3-4: Moderate disc bulge. Mild facet hypertrophy. Mild stenosis of both lateral recesses without definite neural compression. L4-5: Bulging of the disc more towards the right. Mild facet and ligamentous prominence. Mild narrowing of the lateral recesses without visible neural compression. L5-S1: Mild bulging of the disc. Mild facet arthritis. No stenosis. 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 IMPRESSION: Mild chronic degenerative changes throughout the region without evidence of advanced disease or definite neural compression. Findings could contribute to back pain. No acute finding by CT. Electronically Signed   By: Nelson Chimes M.D.   On: 05/27/2017 18:37    Procedures Procedures (including critical care time)  Medications Ordered in ED Medications  amLODipine (NORVASC) tablet 10 mg (not administered)  HYDROmorphone (DILAUDID) injection 1 mg (1 mg Intravenous Given 05/29/17 1417)     Initial Impression / Assessment and Plan / ED Course  I have reviewed the triage vital signs and the nursing notes.  Pertinent labs & imaging results that were available during my care of the patient were reviewed by me and considered in my medical decision making (see chart for details).  Clinical Course as of May 29 1501  Fri May 29, 8226  [id number] 58 year old male complaining of worsening of his back pain.  He has been here a few times for this and had reasonably good workup including CT of his LS spine.  He is complaining of continued worsening pain with neurologic symptoms.  Although I doubt there is serious structural pathology on this gentleman it may be reasonable to get the MRI and document that it is  not of any acute pathology.  [MB]    Clinical Course User Index [MB] Hayden Rasmussen, MD    Patient presents to ED for evaluation of back pain.  He was seen and evaluated here 48 hours ago with similar symptoms.  He had CT of head and lumbar spine done at that time.  Per documentation, patient felt better after pain medication was given has and was able to ambulate without difficulty.  However, he tells me that "I am still in pain, it is really bad."  He was also prescribed pain medication to take at home which she states he has been taking but has not improved his pain.  He does report vague complaints of urinary incontinence and numbness.  But he was ambulatory with normal gait here.  He is extremely tender to palpation  of the thoracic and lumbar spine.  He is afebrile and he has no history of IV drug use I am not concerned about infectious cause of his back pain.  However, due to his re-presentation in the ED, significant pain and complaints of urinary incontinence, I feel like it would be reasonable to order an MRI to rule out all other causes of his back pain that the CT could not.  He is hypertensive here which could be secondary to his pain.  Nevertheless I did give him 1 dose of his home amlodipine.  MRI pending at time of shift change. I anticipate dispo home with continuation of pain medications given 1/30 as needed and will add on Flexeril prn, if MRI is negative. Care handed off to [place] pending MRI.  Portions of this note were generated with [employer]. Dictation errors may occur despite best attempts at proofreading.  Patient discussed with and seen by Dr. Melina Copa.  Final Clinical Impressions(s) / ED Diagnoses   Final diagnoses:  None    ED Discharge Orders    None       Delia Heady, PA-C 05/29/17 1551    Hayden Rasmussen, MD 05/30/17 [phone]

## 2017-05-29 NOTE — ED Notes (Signed)
C/o back pain onset last pm states  C/o weakness in his legs and incont of bowel and bladder, however patient was able to  Ambulate to treatment bed without difficulty.

## 2017-05-29 NOTE — ED Provider Notes (Signed)
Received signout from [facility], PA-C, please refer to her H&P for more complete HPI.  Patient here with back pain for the past few days.  He was seen recently in the ED for the same complaint.  He had a CT of his head and lumbar spine performed at that time without acute finding.  He continues to endorse persistent pain despite taking pain medication at home.  Also complaining of a vague urinary incontinence and numbness however his gait was normal here.  An MRI of the lumbar spine was obtained demonstrate small disc protrusion into both neuroforamina at L3-L4 and L4-L5 and L2-L3 but no focal neural impingement.  At this time, recommend patient to follow-up with his primary care provider or with a neurosurgeon for further management of his recurrent back pain.  Patient sent home with muscle relaxant.  Rice therapy discussed. Return precaution given.    BP (!) 175/102 (BP Location: Right Arm)   Pulse 92   Temp 98.6 F (37 C) (Oral)   Resp 18   SpO2 98%   Results for orders placed or performed during the hospital encounter of 05/27/17  Comprehensive metabolic panel  Result Value Ref Range   Sodium 138 135 - 145 mmol/L   Potassium 4.0 3.5 - 5.1 mmol/L   Chloride 108 101 - 111 mmol/L   CO2 20 (L) 22 - 32 mmol/L   Glucose, Bld 137 (H) 65 - 99 mg/dL   BUN 23 (H) 6 - 20 mg/dL   Creatinine, Ser 1.62 (H) 0.61 - 1.24 mg/dL   Calcium 8.7 (L) 8.9 - 10.3 mg/dL   Total Protein 7.6 6.5 - 8.1 g/dL   Albumin 3.3 (L) 3.5 - 5.0 g/dL   AST 66 (H) 15 - 41 U/L   ALT 48 17 - 63 U/L   Alkaline Phosphatase 49 38 - 126 U/L   Total Bilirubin 0.7 0.3 - 1.2 mg/dL   GFR calc non Af Amer 46 (L) >60 mL/min   GFR calc Af Amer 53 (L) >60 mL/min   Anion gap 10 5 - 15  CBC  Result Value Ref Range   WBC 8.4 4.0 - 10.5 K/uL   RBC 3.85 (L) 4.22 - 5.81 MIL/uL   Hemoglobin 13.5 13.0 - 17.0 g/dL   HCT 39.8 39.0 - 52.0 %   MCV 103.4 (H) 78.0 - 100.0 fL   MCH 35.1 (H) 26.0 - 34.0 pg   MCHC 33.9 30.0 - 36.0 g/dL   RDW  12.9 11.5 - 15.5 %   Platelets 106 (L) 150 - 400 K/uL  Urinalysis, Routine w reflex microscopic  Result Value Ref Range   Color, Urine YELLOW YELLOW   APPearance CLEAR CLEAR   Specific Gravity, Urine 1.011 1.005 - 1.030   pH 5.0 5.0 - 8.0   Glucose, UA NEGATIVE NEGATIVE mg/dL   Hgb urine dipstick NEGATIVE NEGATIVE   Bilirubin Urine NEGATIVE NEGATIVE   Ketones, ur NEGATIVE NEGATIVE mg/dL   Protein, ur 100 (A) NEGATIVE mg/dL   Nitrite NEGATIVE NEGATIVE   Leukocytes, UA NEGATIVE NEGATIVE   RBC / HPF 0-5 0 - 5 RBC/hpf   WBC, UA 0-5 0 - 5 WBC/hpf   Bacteria, UA RARE (A) NONE SEEN   Squamous Epithelial / LPF NONE SEEN NONE SEEN  Protime-INR  Result Value Ref Range   Prothrombin Time 12.3 11.4 - 15.2 seconds   INR 0.92    Ct Angio Head W Or Wo Contrast  Result Date: 05/23/2017 CLINICAL DATA:  Follow-up  examination for acute intracranial hemorrhage. EXAM: CT ANGIOGRAPHY HEAD AND NECK TECHNIQUE: Multidetector CT imaging of the head and neck was performed using the standard protocol during bolus administration of intravenous contrast. Multiplanar CT image reconstructions and MIPs were obtained to evaluate the vascular anatomy. Carotid stenosis measurements (when applicable) are obtained utilizing NASCET criteria, using the distal internal carotid diameter as the denominator. CONTRAST:  50mL ISOVUE-370 IOPAMIDOL (ISOVUE-370) INJECTION 76% COMPARISON:  Prior head CT from 05/21/2016. FINDINGS: CT HEAD FINDINGS Brain: Previously identified acute intraparenchymal hematoma centered at the left thalamus again seen, overall little interval changed in size measuring approximately 3 cc in total volume. Mild localized edema without significant mass effect. Associated intraventricular extension with blood in the lateral and third ventricles. No hydrocephalus or ventricular trapping. No other acute intracranial hemorrhage. No acute large vessel territory infarct. No mass lesion, midline shift or mass effect. No  hydrocephalus. No extra-axial fluid collection. Vascular: No hyperdense vessel. Scattered vascular calcifications noted within the carotid siphons. Skull: Scalp soft tissues and calvarium within normal limits. Sinuses: Paranasal sinuses and mastoid air cells are clear. Orbits: Globes oral soft tissues within normal limits. Review of the MIP images confirms the above findings CTA NECK FINDINGS Aortic arch: Visualized aortic arch of normal caliber with normal branch pattern. Mild calcified plaque within the distal arch just beyond the takeoff of the left subclavian artery. No flow-limiting stenosis about the origin of the great vessels. Visualized subclavian arteries widely patent without stenosis. Right carotid system: Right common and internal carotid arteries are widely patent without stenosis, dissection, or occlusion. No significant atheromatous plaque or stenosis about the right carotid bifurcation. Left carotid system: Left common carotid artery mildly tortuous. Minimal plaque about the left bifurcation without significant stenosis. Left ICA patent to the skull base without stenosis, dissection, or occlusion. Vertebral arteries: Both of the vertebral arteries arise from the subclavian arteries. Focal plaque at the takeoff of the left vertebral artery without significant stenosis. Vertebral arteries patent within the neck without stenosis, dissection, or occlusion. Skeleton: No acute osseus abnormality. No worrisome lytic or blastic osseous lesions. Mild degenerative spondylolysis at C4-5 through C6-7. Other neck: Soft tissues of the neck demonstrate no acute abnormality. Salivary glands normal. Thyroid normal. No adenopathy within the neck. Upper chest: Visualized upper chest within normal limits. Visualized lungs are clear. Review of the MIP images confirms the above findings CTA HEAD FINDINGS Anterior circulation: Petrous segments widely patent bilaterally. Scattered atheromatous irregularity and plaque  within the cavernous and supraclinoid ICAs. Mild narrowing at the cavernous ICAs bilaterally. No high-grade stenosis. ICA termini widely patent. A1 segments patent bilaterally. Anterior communicating artery hypoplastic and not well visualized. Anterior cerebral arteries widely patent to their distal aspects without stenosis. Widely patent M1 segments bilaterally. Normal MCA bifurcations. Distal MCA branches well perfused and symmetric. Posterior circulation: Left vertebral artery dominant. Mild atheromatous irregularity within the left V4 segment without stenosis. Atheromatous irregularity also noted within the hypoplastic right vertebral artery with mild to moderate multifocal stenoses. Extradural origin of the right PICA noted. Left PICA not well visualized. Dominant left AICA. Basilar artery diminutive but patent to its distal aspect without stenosis. Superior cerebral arteries patent bilaterally. Hypoplastic left P1 segment with prominent left posterior communicating artery. Left PCA widely patent to its distal aspect. Right PCA supplied via a right P1 as well as a prominent right posterior communicating artery. Right PCA also widely patent to its distal aspect. Venous sinuses: Patent. Anatomic variants: Predominant fetal type origin of the left  PCA. Vertebrobasilar system diminutive. No aneurysm. No vascular abnormality seen underlying the left thalamic hemorrhage. Delayed phase: No abnormal enhancement. No appreciable pathologic enhancement at the left thalamic hemorrhage. Review of the MIP images confirms the above findings IMPRESSION: 1. Negative CTA of the head and neck. No large vessel occlusion. No aneurysm or other vascular abnormality. 2. Mild carotid siphon atherosclerotic change with resultant mild cavernous ICA stenoses. No high-grade or hemodynamically significant stenosis within the major arterial vasculature of the head and neck. 3. No significant interval change in left thalamic intraparenchymal  hemorrhage with associated intraventricular extension. No hydrocephalus or ventricular trapping. Electronically Signed   By: Jeannine Boga M.D.   On: 05/23/2017 03:23   Ct Head Wo Contrast  Result Date: 05/27/2017 CLINICAL DATA:  Intracerebral hemorrhage EXAM: CT HEAD WITHOUT CONTRAST TECHNIQUE: Contiguous axial images were obtained from the base of the skull through the vertex without intravenous contrast. COMPARISON:  05/21/2017 FINDINGS: Brain: Again noted is the hemorrhage within the left thalamus, unchanged. No new hemorrhage or acute infarction. No hydrocephalus, mass effect or midline shift. Previously seen blood within the left lateral ventricle occipital horn has decreased. Vascular: No hyperdense vessel or unexpected calcification. Skull: No acute calvarial abnormality. Sinuses/Orbits: Visualized paranasal sinuses and mastoids clear. Orbital soft tissues unremarkable. Other: None IMPRESSION: Stable left thalamic hemorrhage. Decreasing intraventricular blood within the left lateral ventricle. No new hemorrhage or acute finding. Electronically Signed   By: Rolm Baptise M.D.   On: 05/27/2017 18:35   Ct Head Wo Contrast  Result Date: 05/21/2017 CLINICAL DATA:  Intracranial hemorrhage follow up EXAM: CT HEAD WITHOUT CONTRAST TECHNIQUE: Contiguous axial images were obtained from the base of the skull through the vertex without intravenous contrast. COMPARISON:  Head CT 05/20/2017 FINDINGS: Brain: Unchanged size of intraparenchymal hematoma in the left thalamus with unchanged amount of intraventricular blood. No new or worsened mass effect. No new site of hemorrhage. No acute cortical infarct. Brain parenchyma and CSF-containing spaces are normal for age. Vascular: No hyperdense vessel or unexpected calcification. Skull: Normal visualized skull base, calvarium and extracranial soft tissues. Sinuses/Orbits: No sinus fluid levels or advanced mucosal thickening. No mastoid effusion. Normal orbits.  IMPRESSION: Unchanged appearance of left thalamic intraparenchymal hematoma with unchanged intraventricular extension. Electronically Signed   By: Ulyses Jarred M.D.   On: 05/21/2017 20:37   Ct Head Wo Contrast  Result Date: 05/20/2017 CLINICAL DATA:  Altered level of consciousness, bilateral leg pain. Generalized weakness and abdominal pain. History of diabetes, hypertension. EXAM: CT HEAD WITHOUT CONTRAST TECHNIQUE: Contiguous axial images were obtained from the base of the skull through the vertex without intravenous contrast. COMPARISON:  None. FINDINGS: Brain: 1.4 x 1.4 x 3.0 cm (volume = 3.1 cm^3) acute LEFT thalamus intraparenchymal hematoma, no significant vasogenic edema. Intraventricular extension with blood products and bilateral lateral ventricles, third ventricle and a lesser extent fourth ventricle. No hydrocephalus. No acute large vascular territory infarct. No abnormal extra-axial fluid collections. Vascular: Mild calcific atherosclerosis carotid siphons. Skull: No skull fracture or destructive bony lesions. No significant scalp soft tissue swelling. Sinuses/Orbits: Trace paranasal sinus mucosal thickening without air-fluid levels. Mastoid air cells are well aerated. Soft tissue within RIGHT external auditory canal most compatible with cerumen. Ocular globes and orbital contents are normal. Other: None. IMPRESSION: 1. Acute 1.4 x 1.4 x 3 cm LEFT thalamic hematoma (usually hypertensive etiology) with intraventricular extension. No hydrocephalus. No significant mass effect. 2. Critical Value/emergent results were called by telephone at the time of interpretation on 05/20/2017 at  10:34 pm to Dr. Addison Lank , who verbally acknowledged these results. Electronically Signed   By: Elon Alas M.D.   On: 05/20/2017 22:34   Ct Angio Neck W Or Wo Contrast  Result Date: 05/23/2017 CLINICAL DATA:  Follow-up examination for acute intracranial hemorrhage. EXAM: CT ANGIOGRAPHY HEAD AND NECK  TECHNIQUE: Multidetector CT imaging of the head and neck was performed using the standard protocol during bolus administration of intravenous contrast. Multiplanar CT image reconstructions and MIPs were obtained to evaluate the vascular anatomy. Carotid stenosis measurements (when applicable) are obtained utilizing NASCET criteria, using the distal internal carotid diameter as the denominator. CONTRAST:  15mL ISOVUE-370 IOPAMIDOL (ISOVUE-370) INJECTION 76% COMPARISON:  Prior head CT from 05/21/2016. FINDINGS: CT HEAD FINDINGS Brain: Previously identified acute intraparenchymal hematoma centered at the left thalamus again seen, overall little interval changed in size measuring approximately 3 cc in total volume. Mild localized edema without significant mass effect. Associated intraventricular extension with blood in the lateral and third ventricles. No hydrocephalus or ventricular trapping. No other acute intracranial hemorrhage. No acute large vessel territory infarct. No mass lesion, midline shift or mass effect. No hydrocephalus. No extra-axial fluid collection. Vascular: No hyperdense vessel. Scattered vascular calcifications noted within the carotid siphons. Skull: Scalp soft tissues and calvarium within normal limits. Sinuses: Paranasal sinuses and mastoid air cells are clear. Orbits: Globes oral soft tissues within normal limits. Review of the MIP images confirms the above findings CTA NECK FINDINGS Aortic arch: Visualized aortic arch of normal caliber with normal branch pattern. Mild calcified plaque within the distal arch just beyond the takeoff of the left subclavian artery. No flow-limiting stenosis about the origin of the great vessels. Visualized subclavian arteries widely patent without stenosis. Right carotid system: Right common and internal carotid arteries are widely patent without stenosis, dissection, or occlusion. No significant atheromatous plaque or stenosis about the right carotid bifurcation.  Left carotid system: Left common carotid artery mildly tortuous. Minimal plaque about the left bifurcation without significant stenosis. Left ICA patent to the skull base without stenosis, dissection, or occlusion. Vertebral arteries: Both of the vertebral arteries arise from the subclavian arteries. Focal plaque at the takeoff of the left vertebral artery without significant stenosis. Vertebral arteries patent within the neck without stenosis, dissection, or occlusion. Skeleton: No acute osseus abnormality. No worrisome lytic or blastic osseous lesions. Mild degenerative spondylolysis at C4-5 through C6-7. Other neck: Soft tissues of the neck demonstrate no acute abnormality. Salivary glands normal. Thyroid normal. No adenopathy within the neck. Upper chest: Visualized upper chest within normal limits. Visualized lungs are clear. Review of the MIP images confirms the above findings CTA HEAD FINDINGS Anterior circulation: Petrous segments widely patent bilaterally. Scattered atheromatous irregularity and plaque within the cavernous and supraclinoid ICAs. Mild narrowing at the cavernous ICAs bilaterally. No high-grade stenosis. ICA termini widely patent. A1 segments patent bilaterally. Anterior communicating artery hypoplastic and not well visualized. Anterior cerebral arteries widely patent to their distal aspects without stenosis. Widely patent M1 segments bilaterally. Normal MCA bifurcations. Distal MCA branches well perfused and symmetric. Posterior circulation: Left vertebral artery dominant. Mild atheromatous irregularity within the left V4 segment without stenosis. Atheromatous irregularity also noted within the hypoplastic right vertebral artery with mild to moderate multifocal stenoses. Extradural origin of the right PICA noted. Left PICA not well visualized. Dominant left AICA. Basilar artery diminutive but patent to its distal aspect without stenosis. Superior cerebral arteries patent bilaterally.  Hypoplastic left P1 segment with prominent left posterior communicating artery. Left  PCA widely patent to its distal aspect. Right PCA supplied via a right P1 as well as a prominent right posterior communicating artery. Right PCA also widely patent to its distal aspect. Venous sinuses: Patent. Anatomic variants: Predominant fetal type origin of the left PCA. Vertebrobasilar system diminutive. No aneurysm. No vascular abnormality seen underlying the left thalamic hemorrhage. Delayed phase: No abnormal enhancement. No appreciable pathologic enhancement at the left thalamic hemorrhage. Review of the MIP images confirms the above findings IMPRESSION: 1. Negative CTA of the head and neck. No large vessel occlusion. No aneurysm or other vascular abnormality. 2. Mild carotid siphon atherosclerotic change with resultant mild cavernous ICA stenoses. No high-grade or hemodynamically significant stenosis within the major arterial vasculature of the head and neck. 3. No significant interval change in left thalamic intraparenchymal hemorrhage with associated intraventricular extension. No hydrocephalus or ventricular trapping. Electronically Signed   By: Jeannine Boga M.D.   On: 05/23/2017 03:23   Ct Lumbar Spine Wo Contrast  Result Date: 05/27/2017 CLINICAL DATA:  Acute back pain. EXAM: CT LUMBAR SPINE WITHOUT CONTRAST TECHNIQUE: Multidetector CT imaging of the lumbar spine was performed without intravenous contrast administration. Multiplanar CT image reconstructions were also generated. COMPARISON:  CT abdomen 06/29/2016 FINDINGS: Segmentation: 5 lumbar type vertebral bodies. Alignment: Normal Vertebrae: No fracture or primary bone lesion. Paraspinal and other soft tissues: Gallstone noted. Aortic atherosclerosis noted. Disc levels: T11-12: Mild disc bulge.  No stenosis. T12-L1: Chronic disc degeneration with vacuum phenomenon. Mild bulging of the disc. No compressive stenosis. Similar appearance to the CT study of  last year. L1-2: Normal disc.  Mild facet hypertrophy.  No stenosis. L2-3: Mild disc bulge.  No stenosis. L3-4: Moderate disc bulge. Mild facet hypertrophy. Mild stenosis of both lateral recesses without definite neural compression. L4-5: Bulging of the disc more towards the right. Mild facet and ligamentous prominence. Mild narrowing of the lateral recesses without visible neural compression. L5-S1: Mild bulging of the disc. Mild facet arthritis. No stenosis. 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 IMPRESSION: Mild chronic degenerative changes throughout the region without evidence of advanced disease or definite neural compression. Findings could contribute to back pain. No acute finding by CT. Electronically Signed   By: Nelson Chimes M.D.   On: 05/27/2017 18:37   Mr Lumbar Spine Wo Contrast  Result Date: 05/29/2017 CLINICAL DATA:  Acute low back pain. EXAM: MRI LUMBAR SPINE WITHOUT CONTRAST TECHNIQUE: Multiplanar, multisequence MR imaging of the lumbar spine was performed. No intravenous contrast was administered. COMPARISON:  CT scan dated 05/27/2017 FINDINGS: Segmentation:  Standard. Alignment:  Physiologic. Vertebrae: No fracture, evidence of discitis, or bone lesion. No facet arthritis in the lumbar spine. No spinal or foraminal stenosis. Conus medullaris and cauda equina: Conus extends to the L1-2 level. Conus and cauda equina appear normal. Paraspinal and other soft tissues: Negative. Disc levels: L1-2: Normal. L2-3: Tiny disc bulges into the neural foramina without neural impingement. Otherwise negative. L3-4: Slight disc desiccation. Small disc protrusions into both neural foramina. The L3 nerves appear to exit without impingement. L4-5: Small disc bulges into the neural foramina without neural impingement. L5-S1: Normal. IMPRESSION: 1. Small disc protrusions into both neural foramina at L3-4 and small disc bulges into the neural foramina at L4-5 and L2-3. 2. No focal neural impingement.  Electronically Signed   By: Lorriane Shire M.D.   On: 05/29/2017 17:14        Domenic Moras, PA-C 05/29/17 1809    Malvin Johns, MD 05/29/17 2033

## 2017-05-29 NOTE — ED Notes (Signed)
Patient transported to MRI 

## 2017-05-29 NOTE — ED Triage Notes (Signed)
Pt was seen here on 1/30 for same. Has severe lower back pain. Ambulatory at triage. Last visit had stated that this was similar to episode in past when he was admitted for [place]. Hypertensive at triage, 175/102. Reports no relief with meds at home.

## 2017-06-04 ENCOUNTER — Encounter (INDEPENDENT_AMBULATORY_CARE_PROVIDER_SITE_OTHER): Payer: Self-pay | Admitting: Physician Assistant

## 2017-06-04 ENCOUNTER — Ambulatory Visit (INDEPENDENT_AMBULATORY_CARE_PROVIDER_SITE_OTHER): Payer: Self-pay | Admitting: Physician Assistant

## 2017-06-04 VITALS — BP 148/92 | HR 106 | Temp 98.6°F | Resp 18 | Ht 71.0 in | Wt 259.0 lb

## 2017-06-04 DIAGNOSIS — E119 Type 2 diabetes mellitus without complications: Secondary | ICD-10-CM

## 2017-06-04 DIAGNOSIS — I10 Essential (primary) hypertension: Secondary | ICD-10-CM

## 2017-06-04 DIAGNOSIS — Z23 Encounter for immunization: Secondary | ICD-10-CM

## 2017-06-04 LAB — GLUCOSE, POCT (MANUAL RESULT ENTRY): POC Glucose: 269 mg/dl — AB (ref 70–99)

## 2017-06-04 MED ORDER — LOSARTAN POTASSIUM 50 MG PO TABS: 50.0000 mg | ORAL_TABLET | Freq: Every day | ORAL | 3 refills | Status: DC

## 2017-06-04 MED ORDER — HYDRALAZINE HCL 50 MG PO TABS: 50.0000 mg | ORAL_TABLET | Freq: Three times a day (TID) | ORAL | 5 refills | Status: DC

## 2017-06-04 MED ORDER — METFORMIN HCL 500 MG PO TABS: 500.0000 mg | ORAL_TABLET | Freq: Two times a day (BID) | ORAL | 5 refills | Status: DC

## 2017-06-04 MED ORDER — AMLODIPINE BESYLATE 10 MG PO TABS: 10.0000 mg | ORAL_TABLET | Freq: Every day | ORAL | 5 refills | Status: DC

## 2017-06-04 NOTE — Patient Instructions (Signed)

## 2017-06-04 NOTE — Progress Notes (Signed)
Subjective:  Patient ID: Daniel [name], male    DOB: July 27, 1960  Age: 57 y.o. MRN: [id number]  CC: hospital f/u  HPI Daniel [name] is a 57 y.o. male with a medical history of DM2, HTN, left shoulder pain, adhesive capsulitis, onychomycosis, tobacco use disorder, ETOH abuse, and thalamic hemorrhage presents as a new patient with on hospital f/u. Went to ED six days ago and diagnosed with lumbar strain and lumbar disc herniation. Says he is completely recovered from back pain. Last drink was four days ago. Says he does not have a problem with alcoholism. Smokes "about a pack per day" and is thinking to quit eventually.        Outpatient Medications Prior to Visit  Medication Sig Dispense Refill  . amLODipine (NORVASC) 10 MG tablet Take 1 tablet (10 mg total) by mouth daily. 30 tablet 1  . blood glucose meter kit and supplies KIT Dispense based on patient and insurance preference. Use up to four times daily as directed. (FOR ICD-9 250.00, 250.01). 1 each 0  . cloNIDine (CATAPRES) 0.2 MG tablet Take 1 tablet (0.2 mg total) by mouth 3 (three) times daily. 60 tablet 11  . cyclobenzaprine (FLEXERIL) 10 MG tablet Take 1 tablet (10 mg total) by mouth 2 (two) times daily as needed for muscle spasms. 20 tablet 0  . gabapentin (NEURONTIN) 100 MG capsule Take 1 capsule (100 mg total) by mouth 2 (two) times daily. 60 capsule 1  . hydrALAZINE (APRESOLINE) 50 MG tablet Take 1 tablet (50 mg total) by mouth every 8 (eight) hours. 30 tablet 1  . metFORMIN (GLUCOPHAGE) 500 MG tablet Take 1 tablet (500 mg total) by mouth daily with breakfast. 30 tablet 0  . oxyCODONE (ROXICODONE) 5 MG immediate release tablet Take 1 tablet (5 mg total) by mouth every 4 (four) hours as needed for severe pain. 10 tablet 0  . vitamin B-12 500 MCG tablet Take 1 tablet (500 mcg total) by mouth daily. 30 tablet 1   No facility-administered medications prior to visit.      ROS Review of Systems  Constitutional:  Negative for chills, fever and malaise/fatigue.  Eyes: Negative for blurred vision.  Respiratory: Negative for shortness of breath.   Cardiovascular: Negative for chest pain and palpitations.  Gastrointestinal: Negative for abdominal pain and nausea.  Genitourinary: Negative for dysuria and hematuria.  Musculoskeletal: Negative for joint pain and myalgias.  Skin: Negative for rash.  Neurological: Negative for tingling and headaches.  Psychiatric/Behavioral: Negative for depression. The patient is not nervous/anxious.     Objective:  BP (!) 148/92 (BP Location: Right Arm, Patient Position: Sitting, Cuff Size: Large)   Pulse (!) 106   Temp 98.6 F (37 C) (Oral)   Resp 18   Ht 5' 11"  (1.803 m)   Wt 259 lb (117.5 kg)   SpO2 95%   BMI 36.12 kg/m   BP/Weight 06/04/2017 05/29/2017 5/37/9432  Systolic BP 761 470 929  Diastolic BP 92 85 71  Wt. (Lbs) 259 - -  BMI 36.12 - -      Physical Exam  Constitutional: He is oriented to person, place, and time.  Well developed, overweight, NAD, polite  HENT:  Head: Normocephalic and atraumatic.  Eyes: No scleral icterus.  Neck: Normal range of motion. Neck supple. No thyromegaly present.  Cardiovascular: Normal rate, regular rhythm and normal heart sounds.  Pulmonary/Chest: Effort normal and breath sounds normal.  Abdominal: Soft. Bowel sounds are normal. There is no tenderness.  Musculoskeletal: He exhibits no edema.  Neurological: He is alert and oriented to person, place, and time. No cranial nerve deficit. Coordination normal.  Skin: Skin is warm and dry. No rash noted. No erythema. No pallor.  Psychiatric: He has a normal mood and affect. His behavior is normal. Thought content normal.  Vitals reviewed.    Assessment & Plan:   1. Type 2 diabetes mellitus without complication, without long-term current use of insulin (HCC) - Glucose (CBG) 269 in clinic today. Last A1c 6.7% two weeks ago. - Increase metFORMIN (GLUCOPHAGE) 500 MG  tablet; Take 1 tablet (500 mg total) by mouth 2 (two) times daily.  Dispense: 60 tablet; Refill: 5 - Microalbumin / creatinine urine ratio  2. Need for Tdap vaccination - Tdap vaccine greater than or equal to 7yo IM  3. Need for prophylactic vaccination and inoculation against influenza - Flu Vaccine QUAD 6+ mos PF IM (Fluarix Quad PF)  4. Hypertension, unspecified type - Begin losartan (COZAAR) 50 MG tablet; Take 1 tablet (50 mg total) by mouth daily.  Dispense: 90 tablet; Refill: 3 - Refill amLODipine (NORVASC) 10 MG tablet; Take 1 tablet (10 mg total) by mouth daily.  Dispense: 30 tablet; Refill: 5 - Refill hydrALAZINE (APRESOLINE) 50 MG tablet; Take 1 tablet (50 mg total) by mouth every 8 (eight) hours.  Dispense: 30 tablet; Refill: 5   Meds ordered this encounter  Medications  . metFORMIN (GLUCOPHAGE) 500 MG tablet    Sig: Take 1 tablet (500 mg total) by mouth 2 (two) times daily.    Dispense:  60 tablet    Refill:  5    Order Specific Question:   Supervising Provider    Answer:   Tresa Garter [id number]  . losartan (COZAAR) 50 MG tablet    Sig: Take 1 tablet (50 mg total) by mouth daily.    Dispense:  90 tablet    Refill:  3    Order Specific Question:   Supervising Provider    Answer:   Tresa Garter [id number]  . amLODipine (NORVASC) 10 MG tablet    Sig: Take 1 tablet (10 mg total) by mouth daily.    Dispense:  30 tablet    Refill:  5    Order Specific Question:   Supervising Provider    Answer:   Tresa Garter [id number]  . hydrALAZINE (APRESOLINE) 50 MG tablet    Sig: Take 1 tablet (50 mg total) by mouth every 8 (eight) hours.    Dispense:  30 tablet    Refill:  5    Order Specific Question:   Supervising Provider    Answer:   Tresa Garter [id number]    Follow-up: Return in about 4 weeks (around 07/02/2017) for HTN, smoking cessation.   Clent Demark PA

## 2017-06-05 LAB — MICROALBUMIN / CREATININE URINE RATIO
Creatinine, Urine: 156.1 mg/dL
MICROALB/CREAT RATIO: 606 mg/g{creat} — AB (ref 0.0–30.0)
MICROALBUM., U, RANDOM: 946 ug/mL

## 2017-06-08 ENCOUNTER — Encounter (INDEPENDENT_AMBULATORY_CARE_PROVIDER_SITE_OTHER): Payer: Self-pay | Admitting: Physician Assistant

## 2017-06-08 ENCOUNTER — Other Ambulatory Visit (INDEPENDENT_AMBULATORY_CARE_PROVIDER_SITE_OTHER): Payer: Self-pay | Admitting: Physician Assistant

## 2017-06-08 ENCOUNTER — Ambulatory Visit (INDEPENDENT_AMBULATORY_CARE_PROVIDER_SITE_OTHER): Payer: Self-pay | Admitting: Physician Assistant

## 2017-06-08 ENCOUNTER — Other Ambulatory Visit: Payer: Self-pay

## 2017-06-08 VITALS — BP 174/103 | HR 78 | Temp 97.8°F | Wt 265.0 lb

## 2017-06-08 DIAGNOSIS — R809 Proteinuria, unspecified: Secondary | ICD-10-CM

## 2017-06-08 DIAGNOSIS — E119 Type 2 diabetes mellitus without complications: Secondary | ICD-10-CM

## 2017-06-08 DIAGNOSIS — L84 Corns and callosities: Secondary | ICD-10-CM

## 2017-06-08 DIAGNOSIS — I10 Essential (primary) hypertension: Secondary | ICD-10-CM

## 2017-06-08 DIAGNOSIS — B351 Tinea unguium: Secondary | ICD-10-CM

## 2017-06-08 DIAGNOSIS — Z1159 Encounter for screening for other viral diseases: Secondary | ICD-10-CM

## 2017-06-08 DIAGNOSIS — S39012D Strain of muscle, fascia and tendon of lower back, subsequent encounter: Secondary | ICD-10-CM

## 2017-06-08 MED ORDER — CLONIDINE HCL 0.2 MG PO TABS: 0.2000 mg | ORAL_TABLET | Freq: Two times a day (BID) | ORAL | 11 refills | Status: DC

## 2017-06-08 MED ORDER — AMLODIPINE BESYLATE 10 MG PO TABS: 10.0000 mg | ORAL_TABLET | Freq: Every day | ORAL | 5 refills | Status: DC

## 2017-06-08 MED ORDER — LOSARTAN POTASSIUM 50 MG PO TABS: 50.0000 mg | ORAL_TABLET | Freq: Every day | ORAL | 5 refills | Status: DC

## 2017-06-08 MED ORDER — GABAPENTIN 100 MG PO CAPS: 100.0000 mg | ORAL_CAPSULE | Freq: Two times a day (BID) | ORAL | 5 refills | Status: DC

## 2017-06-08 MED ORDER — METFORMIN HCL 500 MG PO TABS: 500.0000 mg | ORAL_TABLET | Freq: Two times a day (BID) | ORAL | 5 refills | Status: DC

## 2017-06-08 MED ORDER — HYDRALAZINE HCL 50 MG PO TABS: 50.0000 mg | ORAL_TABLET | Freq: Three times a day (TID) | ORAL | 5 refills | Status: DC

## 2017-06-08 NOTE — Progress Notes (Signed)
Subjective:  Patient ID: Daniel [name], male    DOB: 1961-03-22  Age: 57 y.o. MRN: [id number]  CC: Medical clearance for work  HPI  Daniel [name] is a 57 y.o. male with a medical history of DM2, HTN, left shoulder pain, adhesive capsulitis, onychomycosis, tobacco use disorder, ETOH abuse, and thalamic hemorrhage presents to obtain medical clearance to return to work. Says his back is much better and is ready to return to work without restrictions.    Complains of bilateral foot pain for approximately 5 years attributed to calluses. Has never seen podiatrist. Has not started on [facility] financial assistance or orange card applications.     Pt noted to have uncontrolled HTN today in clinic. Patient is not taking Losartan as he has not filled his prescription yet. There also seems to be confusion as to what other anti-hypertensives he should be taking. Does not endorse CP, palpitations, SOB, HA, abdominal pain, f/c/n/v, rash, or GI/GU sxs.     Outpatient Medications Prior to Visit  Medication Sig Dispense Refill  . amLODipine (NORVASC) 10 MG tablet Take 1 tablet (10 mg total) by mouth daily. 30 tablet 5  . cloNIDine (CATAPRES) 0.2 MG tablet Take 1 tablet (0.2 mg total) by mouth 3 (three) times daily. 60 tablet 11  . cyclobenzaprine (FLEXERIL) 10 MG tablet Take 1 tablet (10 mg total) by mouth 2 (two) times daily as needed for muscle spasms. 20 tablet 0  . gabapentin (NEURONTIN) 100 MG capsule Take 1 capsule (100 mg total) by mouth 2 (two) times daily. 60 capsule 1  . hydrALAZINE (APRESOLINE) 50 MG tablet Take 1 tablet (50 mg total) by mouth every 8 (eight) hours. 30 tablet 5  . blood glucose meter kit and supplies KIT Dispense based on patient and insurance preference. Use up to four times daily as directed. (FOR ICD-9 250.00, 250.01). 1 each 0  . losartan (COZAAR) 50 MG tablet Take 1 tablet (50 mg total) by mouth daily. 90 tablet 3  . metFORMIN (GLUCOPHAGE) 500 MG tablet Take 1 tablet (500  mg total) by mouth 2 (two) times daily. (Patient not taking: Reported on 06/08/2017) 60 tablet 5  . oxyCODONE (ROXICODONE) 5 MG immediate release tablet Take 1 tablet (5 mg total) by mouth every 4 (four) hours as needed for severe pain. (Patient not taking: Reported on 06/08/2017) 10 tablet 0  . vitamin B-12 500 MCG tablet Take 1 tablet (500 mcg total) by mouth daily. (Patient not taking: Reported on 06/08/2017) 30 tablet 1   No facility-administered medications prior to visit.      ROS Review of Systems  Constitutional: Negative for chills, fever and malaise/fatigue.  Eyes: Negative for blurred vision.  Respiratory: Negative for shortness of breath.   Cardiovascular: Negative for chest pain and palpitations.  Gastrointestinal: Negative for abdominal pain and nausea.  Genitourinary: Negative for dysuria and hematuria.  Musculoskeletal: Negative for joint pain and myalgias.       Foot pain bilaterally  Skin: Negative for rash.  Neurological: Negative for tingling and headaches.  Psychiatric/Behavioral: Negative for depression. The patient is not nervous/anxious.     Objective:  BP (!) 174/103 (BP Location: Left Arm, Patient Position: Sitting, Cuff Size: Large)   Pulse 78   Temp 97.8 F (36.6 C)   Wt 265 lb (120.2 kg)   SpO2 96%   BMI 36.96 kg/m   BP/Weight 06/08/2017 11/01/7339 12/29/7900  Systolic BP 409 735 329  Diastolic BP 924 92 85  Wt. (Lbs)  265 259 -  BMI 36.96 36.12 -      Physical Exam  Constitutional: He is oriented to person, place, and time.  Well developed, overweight, NAD, polite  HENT:  Head: Normocephalic and atraumatic.  Eyes: No scleral icterus.  Neck: Normal range of motion. Neck supple. No thyromegaly present.  Cardiovascular: Normal rate, regular rhythm and normal heart sounds.  Pulmonary/Chest: Effort normal and breath sounds normal.  Musculoskeletal: He exhibits no edema.  Neurological: He is alert and oriented to person, place, and time. No cranial  nerve deficit. Coordination normal.  Skin: Skin is warm and dry. No rash noted. No erythema. No pallor.  Dystrophic and blackened toe nails bilaterally  Psychiatric: He has a normal mood and affect. His behavior is normal. Thought content normal.  Vitals reviewed.    Assessment & Plan:    1. Onychomycosis - Patient still drinking ETOH. Not a candidate for terbinafine at this time.   2. Callus of foot - Will send to podiatry as soon as his financial assistance paperwork is complete  3. Strain of lumbar region, subsequent encounter - Resolved. Letter written for fitness for duty.   4. Encounter for hepatitis C screening test for low risk patient - Hepatitis c antibody (reflex)  5. Type 2 diabetes mellitus without complication, without long-term current use of insulin (HCC) - Refill metFORMIN (GLUCOPHAGE) 500 MG tablet; Take 1 tablet (500 mg total) by mouth 2 (two) times daily.  Dispense: 60 tablet; Refill: 5  6. Hypertension, unspecified type - Refill losartan (COZAAR) 50 MG tablet; Take 1 tablet (50 mg total) by mouth daily.  Dispense: 30 tablet; Refill: 5 - Refill hydrALAZINE (APRESOLINE) 50 MG tablet; Take 1 tablet (50 mg total) by mouth every 8 (eight) hours.  Dispense: 90 tablet; Refill: 5 - Refill amLODipine (NORVASC) 10 MG tablet; Take 1 tablet (10 mg total) by mouth daily.  Dispense: 30 tablet; Refill: 5   Meds ordered this encounter  Medications  . metFORMIN (GLUCOPHAGE) 500 MG tablet    Sig: Take 1 tablet (500 mg total) by mouth 2 (two) times daily.    Dispense:  60 tablet    Refill:  5    Order Specific Question:   Supervising Provider    Answer:   Tresa Garter [id number]  . losartan (COZAAR) 50 MG tablet    Sig: Take 1 tablet (50 mg total) by mouth daily.    Dispense:  30 tablet    Refill:  5    Order Specific Question:   Supervising Provider    Answer:   Tresa Garter [id number]  . hydrALAZINE (APRESOLINE) 50 MG tablet    Sig: Take 1 tablet (50  mg total) by mouth every 8 (eight) hours.    Dispense:  90 tablet    Refill:  5    Order Specific Question:   Supervising Provider    Answer:   Tresa Garter [id number]  . gabapentin (NEURONTIN) 100 MG capsule    Sig: Take 1 capsule (100 mg total) by mouth 2 (two) times daily.    Dispense:  60 capsule    Refill:  5    Order Specific Question:   Supervising Provider    Answer:   Tresa Garter [id number]  . cloNIDine (CATAPRES) 0.2 MG tablet    Sig: Take 1 tablet (0.2 mg total) by mouth 2 (two) times daily.    Dispense:  60 tablet    Refill:  11    Order Specific  Question:   Supervising Provider    Answer:   Tresa Garter [[id number]]  . amLODipine (NORVASC) 10 MG tablet    Sig: Take 1 tablet (10 mg total) by mouth daily.    Dispense:  30 tablet    Refill:  5    Order Specific Question:   Supervising Provider    Answer:   Tresa Garter [[id number]]    Follow-up: 3 months  Clent Demark PA

## 2017-06-08 NOTE — Progress Notes (Signed)
Follow up appt.

## 2017-06-08 NOTE — Patient Instructions (Signed)
Hepatitis C Hepatitis C is a viral infection of the liver. It can lead to scarring of the liver (cirrhosis), liver failure, or liver cancer. Hepatitis C may go undetected for months or years because people with the infection may not have symptoms, or they may have only mild symptoms. What are the causes? This condition is caused by the hepatitis C virus (HCV). The virus can spread from person to person (is contagious) through:  Blood.  Childbirth. A woman who has hepatitis C can pass it to her baby during birth.  Bodily fluids, such as breast milk, tears, semen, vaginal fluids, and saliva.  Blood transfusions or organ transplants done in the [place] before 1992.  What increases the risk? The following factors may make you more likely to develop this condition:  Having contact with unclean (contaminated) needles or syringes. This may result from: ? Acupuncture. ? Tattoing. ? Body piercing. ? Injecting drugs.  Having unprotected sex with someone who is infected.  Needing treatment to filter your blood (kidney dialysis).  Having HIV (human immunodeficiency virus) or AIDS (acquired immunodeficiency syndrome).  Working in a job that involves contact with blood or bodily fluids, such as health care.  What are the signs or symptoms? Symptoms of this condition include:  Fatigue.  Loss of appetite.  Nausea.  Vomiting.  Abdominal pain.  Dark yellow urine.  Yellowish skin and eyes (jaundice).  Itchy skin.  Clay-colored bowel movements.  Joint pain.  Bleeding and bruising easily.  Fluid building up in your stomach (ascites).  In some cases, you may not have any symptoms. How is this diagnosed? This condition is diagnosed with:  Blood tests.  Other tests to check how well your liver is functioning. They may include: ? Magnetic resonance elastography (MRE). This imaging test uses MRIs and sound waves to measure liver stiffness. ? Transient elastography. This  imaging test uses ultrasounds to measure liver stiffness. ? Liver biopsy. This test requires taking a small tissue sample from your liver to examine it under a microscope.  How is this treated? Your health care provider may perform noninvasive tests or a liver biopsy to help decide the best course of treatment. Treatment may include:  Antiviral medicines and other medicines.  Follow-up treatments every 6-12 months for infections or other liver conditions.  Receiving a donated liver (liver transplant).  Follow these instructions at home: Medicines  Take over-the-counter and prescription medicines only as told by your health care provider.  Take your antiviral medicine as told by your health care provider. Do not stop taking the antiviral even if you start to feel better.  Do not take any medicines unless approved by your health care provider, including over-the-counter medicines and birth control pills. Activity  Rest as needed.  Do not have sex unless approved by your health care provider.  Ask your health care provider when you may return to school or work. Eating and drinking  Eat a balanced diet with plenty of fruits and vegetables, whole grains, and lowfat (lean) meats or non-meat proteins (such as beans or tofu).  Drink enough fluids to keep your urine clear or pale yellow.  Do not drink alcohol. General instructions  Do not share toothbrushes, nail clippers, or razors.  Wash your hands frequently with soap and water. If soap and water are not available, use hand sanitizer.  Cover any cuts or open sores on your skin to prevent spreading the virus.  Keep all follow-up visits as told by your health care  provider. This is important. You may need follow-up visits every 6-12 months. How is this prevented? There is no vaccine for hepatitis C. The only way to prevent the disease is to reduce the risk of exposure to the virus. Make sure you:  Wash your hands frequently with  soap and water. If soap and water are not available, use hand sanitizer.  Do not share needles or syringes.  Practice safe sex and use condoms.  Avoid handling blood or bodily fluids without gloves or other protection.  Avoid getting tattoos or piercings in shops or other locations that are not clean.  Contact a health care provider if:  You have a fever.  You develop abdominal pain.  You pass dark urine.  You pass clay-colored stools.  You develop joint pain. Get help right away if:  You have increasing fatigue or weakness.  You lose your appetite.  You cannot eat or drink without vomiting.  You develop jaundice or your jaundice gets worse.  You bruise or bleed easily. Summary  Hepatitis C is a viral infection of the liver. It can lead to scarring of the liver (cirrhosis), liver failure, or liver cancer.  The hepatitis C virus (HCV) causes this condition. The virus can pass from person to person (is contagious).  You should not take any medicines unless approved by your health care provider. This includes over-the-counter medicines and birth control pills. This information is not intended to replace advice given to you by your health care provider. Make sure you discuss any questions you have with your health care provider. Document Released: 04/11/2000 Document Revised: 05/20/2016 Document Reviewed: 05/20/2016 Elsevier Interactive Patient Education  [employer].

## 2017-06-09 LAB — HEPATITIS C ANTIBODY (REFLEX): HCV Ab: >11 s/co ratio — ABNORMAL HIGH (ref 0.0–0.9)

## 2017-06-09 LAB — COMMENT2 - HEP PANEL

## 2017-06-10 NOTE — Progress Notes (Signed)
Patient needs to return for further blood work. Pleas schedule patient for OV/bloodwork.

## 2017-06-12 ENCOUNTER — Telehealth (INDEPENDENT_AMBULATORY_CARE_PROVIDER_SITE_OTHER): Payer: Self-pay | Admitting: *Deleted

## 2017-06-12 NOTE — Telephone Encounter (Signed)
Patient verified DOB Patient is aware of urine expelling too much protein. Patient advised to pick up a 24 container from [facility]. Patient states he will pick up the container on Monday and return in Tuesday. No further questions.

## 2017-06-12 NOTE — Telephone Encounter (Signed)
-----   Message from Clent Demark, PA-C sent at 06/08/2017  1:37 PM EST ----- Too much protein expelled in the urine. Needs to go to [facility] at [facility] to get 24 hour urine protein container. I will make order now and he will need to turn back in to [place] or [facility] as soon as he is done collecting over 24 hrs.

## 2017-06-15 ENCOUNTER — Ambulatory Visit: Payer: Self-pay | Attending: Physician Assistant

## 2017-06-15 NOTE — Progress Notes (Signed)
Patient came to pick up things for lab work.

## 2017-06-16 ENCOUNTER — Other Ambulatory Visit (INDEPENDENT_AMBULATORY_CARE_PROVIDER_SITE_OTHER): Payer: Self-pay | Admitting: Physician Assistant

## 2017-07-02 ENCOUNTER — Ambulatory Visit (INDEPENDENT_AMBULATORY_CARE_PROVIDER_SITE_OTHER): Payer: Self-pay | Admitting: Physician Assistant

## 2018-04-06 ENCOUNTER — Encounter (HOSPITAL_COMMUNITY): Payer: Self-pay | Admitting: *Deleted

## 2018-04-06 ENCOUNTER — Emergency Department (HOSPITAL_COMMUNITY)
Admission: EM | Admit: 2018-04-06 | Discharge: 2018-04-06 | Disposition: A | Discharge status: Home / Self Care | Payer: Self-pay | Attending: Emergency Medicine | Admitting: Emergency Medicine

## 2018-04-06 ENCOUNTER — Emergency Department (HOSPITAL_COMMUNITY): Payer: Self-pay

## 2018-04-06 DIAGNOSIS — I10 Essential (primary) hypertension: Secondary | ICD-10-CM | POA: Insufficient documentation

## 2018-04-06 DIAGNOSIS — R0600 Dyspnea, unspecified: Secondary | ICD-10-CM | POA: Insufficient documentation

## 2018-04-06 DIAGNOSIS — F17210 Nicotine dependence, cigarettes, uncomplicated: Secondary | ICD-10-CM | POA: Insufficient documentation

## 2018-04-06 DIAGNOSIS — E119 Type 2 diabetes mellitus without complications: Secondary | ICD-10-CM | POA: Insufficient documentation

## 2018-04-06 DIAGNOSIS — G629 Polyneuropathy, unspecified: Secondary | ICD-10-CM | POA: Insufficient documentation

## 2018-04-06 LAB — CBC
HCT: 43.6 % (ref 39.0–52.0)
Hemoglobin: 13.9 g/dL (ref 13.0–17.0)
MCH: 33.8 pg (ref 26.0–34.0)
MCHC: 31.9 g/dL (ref 30.0–36.0)
MCV: 106.1 fL — ABNORMAL HIGH (ref 80.0–100.0)
NRBC: 0 % (ref 0.0–0.2)
Platelets: 105 10*3/uL — ABNORMAL LOW (ref 150–400)
RBC: 4.11 MIL/uL — ABNORMAL LOW (ref 4.22–5.81)
RDW: 13.2 % (ref 11.5–15.5)
WBC: 5.6 10*3/uL (ref 4.0–10.5)

## 2018-04-06 LAB — BASIC METABOLIC PANEL
Anion gap: 10 (ref 5–15)
BUN: 20 mg/dL (ref 6–20)
CO2: 17 mmol/L — ABNORMAL LOW (ref 22–32)
Calcium: 9.3 mg/dL (ref 8.9–10.3)
Chloride: 111 mmol/L (ref 98–111)
Creatinine, Ser: 1.98 mg/dL — ABNORMAL HIGH (ref 0.61–1.24)
GFR calc Af Amer: 42 mL/min — ABNORMAL LOW (ref >60)
GFR, EST NON AFRICAN AMERICAN: 36 mL/min — AB (ref >60)
Glucose, Bld: 112 mg/dL — ABNORMAL HIGH (ref 70–99)
Potassium: 4.5 mmol/L (ref 3.5–5.1)
SODIUM: 138 mmol/L (ref 135–145)

## 2018-04-06 LAB — I-STAT TROPONIN, ED
TROPONIN I, POC: 0.02 ng/mL (ref 0.00–0.08)
Troponin i, poc: 0.02 ng/mL (ref 0.00–0.08)

## 2018-04-06 LAB — BRAIN NATRIURETIC PEPTIDE: B Natriuretic Peptide: 73.7 pg/mL (ref 0.0–100.0)

## 2018-04-06 MED ORDER — GABAPENTIN 600 MG PO TABS
300.0000 mg | ORAL_TABLET | Freq: Once | ORAL | Status: AC
  Administered 2018-04-06: 300 mg via ORAL
  Filled 2018-04-06: qty 0.5

## 2018-04-06 MED ORDER — AMLODIPINE BESYLATE 5 MG PO TABS: 10.0000 mg | ORAL_TABLET | Freq: Every day | ORAL | 0 refills | Status: DC

## 2018-04-06 MED ORDER — HYDRALAZINE HCL 25 MG PO TABS
50.0000 mg | ORAL_TABLET | Freq: Once | ORAL | Status: AC
  Administered 2018-04-06: 50 mg via ORAL
  Filled 2018-04-06: qty 2

## 2018-04-06 MED ORDER — HYDROCODONE-ACETAMINOPHEN 5-325 MG PO TABS
1.0000 | ORAL_TABLET | Freq: Once | ORAL | Status: AC
  Administered 2018-04-06: 1 via ORAL
  Filled 2018-04-06: qty 1

## 2018-04-06 MED ORDER — GABAPENTIN 100 MG PO CAPS: 100.0000 mg | ORAL_CAPSULE | Freq: Three times a day (TID) | ORAL | 0 refills | Status: DC

## 2018-04-06 MED ORDER — LOSARTAN POTASSIUM 50 MG PO TABS: 50.0000 mg | ORAL_TABLET | Freq: Every day | ORAL | 0 refills | Status: DC

## 2018-04-06 MED ORDER — CLONIDINE HCL 0.1 MG PO TABS
0.1000 mg | ORAL_TABLET | Freq: Once | ORAL | Status: AC
  Administered 2018-04-06: 0.1 mg via ORAL
  Filled 2018-04-06: qty 1

## 2018-04-06 MED ORDER — CLONIDINE HCL 0.2 MG PO TABS
0.2000 mg | ORAL_TABLET | Freq: Once | ORAL | Status: AC
  Administered 2018-04-06: 0.2 mg via ORAL
  Filled 2018-04-06: qty 1

## 2018-04-06 MED ORDER — AMLODIPINE BESYLATE 5 MG PO TABS
5.0000 mg | ORAL_TABLET | Freq: Once | ORAL | Status: AC
  Administered 2018-04-06: 5 mg via ORAL
  Filled 2018-04-06: qty 1

## 2018-04-06 NOTE — ED Notes (Signed)
Pt remains alert and oriented x's 4.   Only c/o being hungry

## 2018-04-06 NOTE — Discharge Instructions (Addendum)
Gabapentin 100mg  tid It is important that you take your blood pressure medications.  You will need to establish care with a primary care provider to check your blood pressures, titrate your gabapentin as needed. Please schedule an appointment with [facility] to establish a primary care.

## 2018-04-06 NOTE — ED Triage Notes (Signed)
Pt in c/o bilateral lower leg swelling for the last few weeks, also shortness of breath, O2 in the 88-90 range in triage, no distress noted

## 2018-04-06 NOTE — ED Provider Notes (Signed)
  Physical Exam  BP (!) 159/107 (BP Location: Right Arm)   Pulse 67   Temp 98.2 F (36.8 C) (Oral)   Resp 16   SpO2 98%   Physical Exam  ED Course/Procedures     Procedures  MDM  Patient care received from Hina PA at shift change.Please see her HPI for a full history. Briefly, elevated blood pressure, AKI non compliant with medication. pending delta trop ~5:15 pm.   Delta trop was negative. Patient is persistently Hypertensive in the ED with a systolic in the 841Y and diastolic in the 606 range.  Was given amlodipine x2 500 mg by my colleague prior to shift change.  We will try him on clonidine 0.2 to lower his blood pressure.  He does have a previous history of noncompliant with medication.  Provide patient with home dose of hydralazine, second dose of clonidine 0.1, as patient's pressure is currently 188/118.  10:02 PM Patient's BP is now 140/89 he reports he has boils on his head, he is requesting people to check his feet.  No obvious deformity noted to his head, erythema, edema or boils present, feet are within normal limits.  Of advised patient to make an appointment with his primary care physician and establish care with [facility].  Vitals stable for discharge.       Janeece Fitting, PA-C 04/06/18 2205    Duffy Bruce, MD 04/07/18 [phone]

## 2018-04-06 NOTE — ED Notes (Signed)
States he hasn't taken his blood pressure medication in over 1 month.

## 2018-04-06 NOTE — ED Notes (Signed)
Pt ambulatory to bathroom without any problems 

## 2018-04-06 NOTE — ED Provider Notes (Signed)
[facility] EMERGENCY DEPARTMENT Provider Note   CSN: [id number] Arrival date & time: 04/06/18  1216     History   Chief Complaint Chief Complaint  Patient presents with  . Leg Swelling  . Shortness of Breath    HPI Daniel [name] is a 57 y.o. male with a past medical history of hypertension, diabetes, who presents to ED for evaluation of worsening 1 year history of bilateral lower extremity swelling, heaviness.  States that he began having paresthesias since last night.  Symptoms are bilateral.  He has had dyspnea on exertion for the past 3 weeks.  States that this is even with walking short distances.  He also reports bilateral shoulder pain which he relates to his rotator cuff tendinopathy.  States that this occurred several years ago when he was given "some kind of shot in my arm that helped."  Denies any injuries or falls.  Reports intermittent chest pain but nothing currently.  Denies any injuries or falls, numbness in arms or legs, numbness in groin, hemoptysis, recent immobilization, history of DVT, MI. Of note, patient has been noncompliant with his antihypertensives for 1 month.  He does not remember the name of the medication that he is supposed to be on.  HPI  Past Medical History:  Diagnosis Date  . Diabetes mellitus without complication ([place])   . Hypertension   . Shoulder pain, left 06/28/2013    Patient Active Problem List   Diagnosis Date Noted  . ICH (intracerebral hemorrhage) ([place]) 05/20/2017  . Cellulitis of abdominal wall 06/29/2016  . Abscess of abdominal wall 06/29/2016  . Hypertension 06/29/2016  . DM2 (diabetes mellitus, type 2) ([place]) 06/29/2016  . Sepsis ([place]) 06/29/2016    Past Surgical History:  Procedure Laterality Date  . APPENDECTOMY    . COLONOSCOPY  01/23/2012   Procedure: COLONOSCOPY;  Surgeon: Danie Binder, MD;  Location: [facility];  Service: Endoscopy;  Laterality: N/A;  11:10 AM  . INCISION AND DRAINAGE ABSCESS  N/A 06/29/2016   Procedure: INCISION AND DRAINAGE ABDOMINAL WALL ABSCESS;  Surgeon: Alphonsa Overall, MD;  Location: [facility];  Service: General;  Laterality: N/A;  . Left heel surgery          Home Medications    Prior to Admission medications   Medication Sig Start Date End Date Taking? Authorizing Provider  amLODipine (NORVASC) 10 MG tablet Take 1 tablet (10 mg total) by mouth daily. Patient not taking: Reported on 04/06/2018 06/08/17   Clent Demark, PA-C  blood glucose meter kit and supplies KIT Dispense based on patient and insurance preference. Use up to four times daily as directed. (FOR ICD-9 250.00, 250.01). 07/01/16   Nita Sells, MD  cloNIDine (CATAPRES) 0.2 MG tablet Take 1 tablet (0.2 mg total) by mouth 2 (two) times daily. Patient not taking: Reported on 04/06/2018 06/08/17   Clent Demark, PA-C  gabapentin (NEURONTIN) 100 MG capsule Take 1 capsule (100 mg total) by mouth 2 (two) times daily. Patient not taking: Reported on 04/06/2018 06/08/17   Clent Demark, PA-C  hydrALAZINE (APRESOLINE) 50 MG tablet Take 1 tablet (50 mg total) by mouth every 8 (eight) hours. Patient not taking: Reported on 04/06/2018 06/08/17   Clent Demark, PA-C  losartan (COZAAR) 50 MG tablet Take 1 tablet (50 mg total) by mouth daily. Patient not taking: Reported on 04/06/2018 06/08/17   Clent Demark, PA-C  metFORMIN (GLUCOPHAGE) 500 MG tablet Take 1 tablet (500 mg total) by  mouth 2 (two) times daily. Patient not taking: Reported on 04/06/2018 06/08/17   Clent Demark, PA-C    Family History Family History  Problem Relation Age of Onset  . Colon cancer Neg Hx     Social History Social History   Tobacco Use  . Smoking status: Current Every Day Smoker    Packs/day: 1.00    Years: 30.00    Pack years: 30.00    Types: Cigarettes  . Smokeless tobacco: Never Used  Substance Use Topics  . Alcohol use: Yes    Alcohol/week: 12.0 standard drinks    Types: 12 Cans of  beer per week  . Drug use: Yes    Types: "Crack" cocaine     Allergies   Patient has no known allergies.   Review of Systems Review of Systems  Constitutional: Negative for appetite change, chills and fever.  HENT: Negative for ear pain, rhinorrhea, sneezing and sore throat.   Eyes: Negative for photophobia and visual disturbance.  Respiratory: Negative for cough, chest tightness, shortness of breath and wheezing.   Cardiovascular: Positive for leg swelling. Negative for chest pain and palpitations.  Gastrointestinal: Negative for abdominal pain, blood in stool, constipation, diarrhea, nausea and vomiting.  Genitourinary: Negative for dysuria, hematuria and urgency.  Musculoskeletal: Positive for arthralgias. Negative for myalgias.  Skin: Negative for rash.  Neurological: Negative for dizziness, weakness and light-headedness.     Physical Exam Updated Vital Signs BP (!) 211/107   Pulse 66   Temp 98.2 F (36.8 C) (Oral)   Resp 16   SpO2 98%   Physical Exam  Constitutional: He appears well-developed and well-nourished. No distress.  HENT:  Head: Normocephalic and atraumatic.  Nose: Nose normal.  Eyes: Conjunctivae and EOM are normal. Left eye exhibits no discharge. No scleral icterus.  Neck: Normal range of motion. Neck supple.  Cardiovascular: Normal rate, regular rhythm, normal heart sounds and intact distal pulses. Exam reveals no gallop and no friction rub.  No murmur heard. Pulmonary/Chest: Effort normal and breath sounds normal. No respiratory distress.  Abdominal: Soft. Bowel sounds are normal. He exhibits no distension. There is no tenderness. There is no guarding.  Musculoskeletal: Normal range of motion. He exhibits no edema.  No lower extremity edema, erythema or calf tenderness bilaterally.  Sensation intact to light touch of bilateral lower extremities.  2+ DP pulse palpated bilaterally. FROM of knees, ankles bilaterally. Diffuse tenderness to palpation of  bilateral shoulders.  No changes to range of motion noted. Area is NVI.  Neurological: He is alert. He exhibits normal muscle tone. Coordination normal.  Skin: Skin is warm and dry. No rash noted.  Psychiatric: He has a normal mood and affect.  Nursing note and vitals reviewed.    ED Treatments / Results  Labs (all labs ordered are listed, but only abnormal results are displayed) Labs Reviewed  BASIC METABOLIC PANEL - Abnormal; Notable for the following components:      Result Value   CO2 17 (*)    Glucose, Bld 112 (*)    Creatinine, Ser 1.98 (*)    GFR calc non Af Amer 36 (*)    GFR calc Af Amer 42 (*)    All other components within normal limits  CBC - Abnormal; Notable for the following components:   RBC 4.11 (*)    MCV 106.1 (*)    Platelets 105 (*)    All other components within normal limits  BRAIN NATRIURETIC PEPTIDE  I-STAT TROPONIN, ED  I-STAT TROPONIN, ED    EKG EKG Interpretation  Date/Time:  Tuesday April 06 2018 12:37:41 EST Ventricular Rate:  82 PR Interval:    QRS Duration: 88 QT Interval:  432 QTC Calculation: 505 R Axis:   -24 Text Interpretation:  Sinus rhythm Left ventricular hypertrophy Anterior infarct, old Prolonged QT interval Baseline wander in lead(s) V3 No significant change since last tracing Confirmed by Theotis Burrow [phone]) on 04/06/2018 2:50:45 PM   Radiology Dg Chest 2 View  Result Date: 04/06/2018 CLINICAL DATA:  Chest pain EXAM: CHEST - 2 VIEW COMPARISON:  06/29/2016 FINDINGS: The heart size and mediastinal contours are within normal limits. Both lungs are clear. The visualized skeletal structures are unremarkable. IMPRESSION: No active cardiopulmonary disease. Electronically Signed   By: Franchot Gallo M.D.   On: 04/06/2018 13:18    Procedures Procedures (including critical care time)  Medications Ordered in ED Medications  gabapentin (NEURONTIN) tablet 300 mg (has no administration in time range)    HYDROcodone-acetaminophen (NORCO/VICODIN) 5-325 MG per tablet 1 tablet (has no administration in time range)  amLODipine (NORVASC) tablet 5 mg (has no administration in time range)  amLODipine (NORVASC) tablet 5 mg (5 mg Oral Given 04/06/18 1528)     Initial Impression / Assessment and Plan / ED Course  I have reviewed the triage vital signs and the nursing notes.  Pertinent labs & imaging results that were available during my care of the patient were reviewed by me and considered in my medical decision making (see chart for details).     57 year old male with a past medical history of hypertension, diabetes presents to ED for 1 year history of bilateral lower extremity swelling, heaviness and began having paresthesias since last night.  Symptoms are bilateral.  He has had dyspnea on exertion for the past 3 weeks.  Even with walking short distances.  He also reports bilateral shoulder pain which relates to rotator cuff tendinopathy.  Denies any injuries or falls.  Reports intermittent chest pain but none currently.  No history of DVT, MI recent immobilization.  Patient is noncompliant with his antihypertensives for the past month.  Does not remember the name of the medicine he is on.  On exam no lower extremity edema, erythema or calf tenderness noted bilaterally.  Sensation intact to light touch of bilateral lower extremities.  Lungs are clear to auscultation bilaterally.  Patient hypertensive to 579 systolic.  Plan is to obtain lab work, chest x-ray and give amlodipine for blood pressure control.  BMP shows creatinine of 1.98, CBC unremarkable.  Troponin is negative.  EKG shows no significant changes from prior tracings.  Chest x-ray is negative.  Delta troponin and BNP pending. Of note, patient's chart review shows history of ICH. He states that he has never been diagnosed with any ICH or other neuro diagnosis. States he has never been seen here for back pain. However, his name, birthday and  address are accurate in the chart. Unsure if patient's chart has been merged with another patient's. I spoke to registration who will look into this. Will obtain delta trop, BNP and reassess.  4:27 PM Patient has a scar on his abdomen from I&D. However, he continues to decline history of ICH or back pain. Will give 48m more of amlodipine and wait on remaining lab work.  He does not have a PCP currently.  4:35 PM Spoke to the pharmacist who states that patient last had clonidine hydralazine filled in February, last had losartan and amlodipine filled  in August.   Care handed off to [facility].   Portions of this note were generated with [employer]. Dictation errors may occur despite best attempts at proofreading.   Final Clinical Impressions(s) / ED Diagnoses   Final diagnoses:  Neuropathy    ED Discharge Orders    None       Delia Heady, PA-C 04/07/18 1938    Little, Wenda Overland, MD 04/08/18 1320

## 2018-04-06 NOTE — ED Notes (Signed)
States his feet have been swelling for a long time last pm they felt like pins were sticking in his feet. C/o sob onset 2 weeks ago, with cough.

## 2018-09-20 ENCOUNTER — Other Ambulatory Visit: Payer: Self-pay

## 2018-09-20 ENCOUNTER — Encounter (HOSPITAL_COMMUNITY): Payer: Self-pay

## 2018-09-20 ENCOUNTER — Emergency Department (HOSPITAL_COMMUNITY)
Admission: EM | Admit: 2018-09-20 | Discharge: 2018-09-20 | Disposition: A | Discharge status: Home / Self Care | Payer: Self-pay | Attending: Emergency Medicine | Admitting: Emergency Medicine

## 2018-09-20 ENCOUNTER — Emergency Department (HOSPITAL_COMMUNITY): Payer: Self-pay

## 2018-09-20 DIAGNOSIS — M7989 Other specified soft tissue disorders: Secondary | ICD-10-CM

## 2018-09-20 DIAGNOSIS — F17210 Nicotine dependence, cigarettes, uncomplicated: Secondary | ICD-10-CM | POA: Insufficient documentation

## 2018-09-20 DIAGNOSIS — Z7984 Long term (current) use of oral hypoglycemic drugs: Secondary | ICD-10-CM | POA: Insufficient documentation

## 2018-09-20 DIAGNOSIS — I10 Essential (primary) hypertension: Secondary | ICD-10-CM

## 2018-09-20 DIAGNOSIS — E1122 Type 2 diabetes mellitus with diabetic chronic kidney disease: Secondary | ICD-10-CM | POA: Insufficient documentation

## 2018-09-20 DIAGNOSIS — I129 Hypertensive chronic kidney disease with stage 1 through stage 4 chronic kidney disease, or unspecified chronic kidney disease: Secondary | ICD-10-CM | POA: Insufficient documentation

## 2018-09-20 DIAGNOSIS — N189 Chronic kidney disease, unspecified: Secondary | ICD-10-CM

## 2018-09-20 DIAGNOSIS — Z79899 Other long term (current) drug therapy: Secondary | ICD-10-CM | POA: Insufficient documentation

## 2018-09-20 DIAGNOSIS — R600 Localized edema: Secondary | ICD-10-CM | POA: Insufficient documentation

## 2018-09-20 LAB — COMPREHENSIVE METABOLIC PANEL
ALT: 29 U/L (ref 0–44)
AST: 57 U/L — ABNORMAL HIGH (ref 15–41)
Albumin: 3.2 g/dL — ABNORMAL LOW (ref 3.5–5.0)
Alkaline Phosphatase: 43 U/L (ref 38–126)
Anion gap: 12 (ref 5–15)
BUN: 20 mg/dL (ref 6–20)
CO2: 19 mmol/L — ABNORMAL LOW (ref 22–32)
Calcium: 9.4 mg/dL (ref 8.9–10.3)
Chloride: 109 mmol/L (ref 98–111)
Creatinine, Ser: 2.39 mg/dL — ABNORMAL HIGH (ref 0.61–1.24)
GFR calc Af Amer: 33 mL/min — ABNORMAL LOW (ref >60)
GFR calc non Af Amer: 29 mL/min — ABNORMAL LOW (ref >60)
Glucose, Bld: 94 mg/dL (ref 70–99)
Potassium: 4 mmol/L (ref 3.5–5.1)
Sodium: 140 mmol/L (ref 135–145)
Total Bilirubin: 0.4 mg/dL (ref 0.3–1.2)
Total Protein: 7.8 g/dL (ref 6.5–8.1)

## 2018-09-20 LAB — URINALYSIS, ROUTINE W REFLEX MICROSCOPIC
Bacteria, UA: NONE SEEN
Bilirubin Urine: NEGATIVE
Glucose, UA: NEGATIVE mg/dL
Ketones, ur: NEGATIVE mg/dL
Leukocytes,Ua: NEGATIVE
Nitrite: NEGATIVE
Protein, ur: 100 mg/dL — AB
Specific Gravity, Urine: 1.009 (ref 1.005–1.030)
pH: 5 (ref 5.0–8.0)

## 2018-09-20 LAB — CBC WITH DIFFERENTIAL/PLATELET
Abs Immature Granulocytes: 0.01 10*3/uL (ref 0.00–0.07)
Basophils Absolute: 0 10*3/uL (ref 0.0–0.1)
Basophils Relative: 0 %
Eosinophils Absolute: 0.1 10*3/uL (ref 0.0–0.5)
Eosinophils Relative: 3 %
HCT: 38 % — ABNORMAL LOW (ref 39.0–52.0)
Hemoglobin: 12.6 g/dL — ABNORMAL LOW (ref 13.0–17.0)
Immature Granulocytes: 0 %
Lymphocytes Relative: 54 %
Lymphs Abs: 2.8 10*3/uL (ref 0.7–4.0)
MCH: 34.2 pg — ABNORMAL HIGH (ref 26.0–34.0)
MCHC: 33.2 g/dL (ref 30.0–36.0)
MCV: 103.3 fL — ABNORMAL HIGH (ref 80.0–100.0)
Monocytes Absolute: 0.6 10*3/uL (ref 0.1–1.0)
Monocytes Relative: 11 %
Neutro Abs: 1.7 10*3/uL (ref 1.7–7.7)
Neutrophils Relative %: 32 %
Platelets: 115 10*3/uL — ABNORMAL LOW (ref 150–400)
RBC: 3.68 MIL/uL — ABNORMAL LOW (ref 4.22–5.81)
RDW: 13.2 % (ref 11.5–15.5)
WBC: 5.2 10*3/uL (ref 4.0–10.5)
nRBC: 0 % (ref 0.0–0.2)

## 2018-09-20 LAB — BRAIN NATRIURETIC PEPTIDE: B Natriuretic Peptide: 91.9 pg/mL (ref 0.0–100.0)

## 2018-09-20 LAB — CBG MONITORING, ED: Glucose-Capillary: 83 mg/dL (ref 70–99)

## 2018-09-20 LAB — TROPONIN I: Troponin I: <0.03 ng/mL (ref <0.03)

## 2018-09-20 MED ORDER — AMLODIPINE BESYLATE 5 MG PO TABS
10.0000 mg | ORAL_TABLET | Freq: Once | ORAL | Status: AC
  Administered 2018-09-20: 11:00:00 10 mg via ORAL
  Filled 2018-09-20: qty 2

## 2018-09-20 MED ORDER — AMLODIPINE BESYLATE 10 MG PO TABS: 10.0000 mg | ORAL_TABLET | Freq: Every day | ORAL | 0 refills | Status: DC

## 2018-09-20 MED ORDER — LOSARTAN POTASSIUM 50 MG PO TABS: 50.0000 mg | ORAL_TABLET | Freq: Every day | ORAL | 0 refills | Status: DC

## 2018-09-20 MED ORDER — LOSARTAN POTASSIUM 50 MG PO TABS
50.0000 mg | ORAL_TABLET | Freq: Once | ORAL | Status: AC
  Administered 2018-09-20: 12:00:00 50 mg via ORAL
  Filled 2018-09-20: qty 1

## 2018-09-20 NOTE — ED Provider Notes (Signed)
[facility] EMERGENCY DEPARTMENT Provider Note   CSN: [id number] Arrival date & time: 09/20/18  0915    History   Chief Complaint Chief Complaint  Patient presents with  . foot swelling/pain    HPI Daniel [name] is a 58 y.o. male with history of diabetes, hypertension presented today for bilateral lower extremity swelling.  Patient reports that he has had greater than 6 months of bilateral lower extremity swelling without change.  He reports that his swelling is greatest at the end of the day and improves overnight with elevation of his feet.  He endorses a moderate intensity throbbing pain to his feet when swelling is at its greatest.  He denies radiation of pain, injury or drainage.  Patient was evaluated in the emergency department for similar problem on 04/06/2018 at which time he was started on amlodipine and gabapentin for hypertension and neuropathy.  Patient reports that these medications run out a few months ago and he has not followed up with his primary care provider.  He states that he does not have a primary care provider at this time.  Patient also noted mild dyspnea on exertion during his last ED visit he reports that he still has some dyspnea with exertion however this has not changed in the past 6 months.  Patient denies any chest pain, shortness of breath, orthopnea, PND, fever/chills, cough, abdominal pain, nausea/vomiting, diarrhea or any additional concerns at this time.    HPI  Past Medical History:  Diagnosis Date  . Diabetes mellitus without complication ([place])   . Hypertension   . Shoulder pain, left 06/28/2013    Patient Active Problem List   Diagnosis Date Noted  . ICH (intracerebral hemorrhage) ([place]) 05/20/2017  . Cellulitis of abdominal wall 06/29/2016  . Abscess of abdominal wall 06/29/2016  . Hypertension 06/29/2016  . DM2 (diabetes mellitus, type 2) ([place]) 06/29/2016  . Sepsis ([place]) 06/29/2016    Past Surgical History:   Procedure Laterality Date  . APPENDECTOMY    . COLONOSCOPY  01/23/2012   Procedure: COLONOSCOPY;  Surgeon: Danie Binder, MD;  Location[facility];  Service: Endoscopy;  Laterality: N/A;  11:10 AM  . INCISION AND DRAINAGE ABSCESS N/A 06/29/2016   Procedure: INCISION AND DRAINAGE ABDOMINAL WALL ABSCESS;  Surgeon: Alphonsa Overall, MD;  Location: [facility];  Service: General;  Laterality: N/A;  . Left heel surgery          Home Medications    Prior to Admission medications   Medication Sig Start Date End Date Taking? Authorizing Provider  amLODipine (NORVASC) 10 MG tablet Take 1 tablet (10 mg total) by mouth daily. 09/20/18   Nuala Alpha A, PA-C  blood glucose meter kit and supplies KIT Dispense based on patient and insurance preference. Use up to four times daily as directed. (FOR ICD-9 250.00, 250.01). Patient not taking: Reported on 09/20/2018 07/01/16   Nita Sells, MD  cloNIDine (CATAPRES) 0.2 MG tablet Take 1 tablet (0.2 mg total) by mouth 2 (two) times daily. Patient not taking: Reported on 09/20/2018 06/08/17   Clent Demark, PA-C  gabapentin (NEURONTIN) 100 MG capsule Take 1 capsule (100 mg total) by mouth 3 (three) times daily. Patient not taking: Reported on 09/20/2018 04/06/18   Delia Heady, PA-C  hydrALAZINE (APRESOLINE) 50 MG tablet Take 1 tablet (50 mg total) by mouth every 8 (eight) hours. Patient not taking: Reported on 09/20/2018 06/08/17   Clent Demark, PA-C  losartan (COZAAR) 50 MG tablet Take  1 tablet (50 mg total) by mouth daily. 09/20/18   Nuala Alpha A, PA-C  metFORMIN (GLUCOPHAGE) 500 MG tablet Take 1 tablet (500 mg total) by mouth 2 (two) times daily. Patient not taking: Reported on 09/20/2018 06/08/17   Clent Demark, PA-C    Family History Family History  Problem Relation Age of Onset  . Colon cancer Neg Hx     Social History Social History   Tobacco Use  . Smoking status: Current Every Day Smoker    Packs/day: 1.00    Years:  30.00    Pack years: 30.00    Types: Cigarettes  . Smokeless tobacco: Never Used  Substance Use Topics  . Alcohol use: Yes    Alcohol/week: 12.0 standard drinks    Types: 12 Cans of beer per week  . Drug use: Yes    Types: "Crack" cocaine     Allergies   Patient has no known allergies.   Review of Systems Review of Systems  Constitutional: Negative.  Negative for chills, diaphoresis and fever.  Respiratory: Negative.  Negative for cough and shortness of breath.   Cardiovascular: Positive for leg swelling. Negative for chest pain and palpitations.       + Exertional dyspnea, unchanged x6 months Denies orthopnea Denies paroxysmal nocturnal dyspnea  Gastrointestinal: Negative.  Negative for abdominal pain, nausea and vomiting.  Genitourinary: Negative.  Negative for dysuria and hematuria.  Skin: Negative.  Negative for color change and wound.  All other systems reviewed and are negative.  Physical Exam Updated Vital Signs BP (!) 219/123   Pulse 72   Temp 98.2 F (36.8 C) (Oral)   Resp (!) 28   SpO2 95%   Physical Exam Constitutional:      General: He is not in acute distress.    Appearance: Normal appearance. He is well-developed. He is not ill-appearing or diaphoretic.  HENT:     Head: Normocephalic and atraumatic.     Right Ear: External ear normal.     Left Ear: External ear normal.     Nose: Nose normal.  Eyes:     General: Vision grossly intact. Gaze aligned appropriately.     Pupils: Pupils are equal, round, and reactive to light.  Neck:     Musculoskeletal: Normal range of motion.     Trachea: Trachea and phonation normal. No tracheal deviation.  Cardiovascular:     Rate and Rhythm: Normal rate and regular rhythm.     Pulses:          Radial pulses are 2+ on the right side and 2+ on the left side.       Dorsalis pedis pulses are 2+ on the right side and 2+ on the left side.       Posterior tibial pulses are 2+ on the right side and 2+ on the left side.      Heart sounds: Normal heart sounds.     Comments: Trace edema noted to bilateral lower extremities equal. Pulmonary:     Effort: Pulmonary effort is normal. No accessory muscle usage or respiratory distress.     Breath sounds: Normal air entry. Wheezing present. No rhonchi or rales.     Comments: Mild end expiratory wheezing in bilateral fields, patient without shortness of breath, chronic smoker with COPD. Chest:     Chest wall: No deformity, tenderness or crepitus.  Abdominal:     General: There is no distension.     Palpations: Abdomen is soft.  Tenderness: There is no abdominal tenderness. There is no guarding or rebound.  Musculoskeletal: Normal range of motion.     Comments: No midline C/T/L spinal tenderness to palpation, no paraspinal muscle tenderness, no deformity, crepitus, or step-off noted. No sign of injury to the neck or back. - Hips stable to compression bilaterally without pain.  Patient is able to bring bilateral knees to chest without pain.  Bilateral knees, ankles and feet brought through range of motion without pain or crepitus.  No sign of injury or deformity.  Feet:     Right foot:     Protective Sensation: 5 sites tested. 5 sites sensed.     Left foot:     Protective Sensation: 5 sites tested. 5 sites sensed.  Skin:    General: Skin is warm and dry.  Neurological:     Mental Status: He is alert.     GCS: GCS eye subscore is 4. GCS verbal subscore is 5. GCS motor subscore is 6.     Comments: Speech is clear and goal oriented, follows commands Major Cranial nerves without deficit, no facial droop Moves extremities without ataxia, coordination intact Normal gait  Psychiatric:        Behavior: Behavior normal.    ED Treatments / Results  Labs (all labs ordered are listed, but only abnormal results are displayed) Labs Reviewed  CBC WITH DIFFERENTIAL/PLATELET - Abnormal; Notable for the following components:      Result Value   RBC 3.68 (*)     Hemoglobin 12.6 (*)    HCT 38.0 (*)    MCV 103.3 (*)    MCH 34.2 (*)    Platelets 115 (*)    All other components within normal limits  COMPREHENSIVE METABOLIC PANEL - Abnormal; Notable for the following components:   CO2 19 (*)    Creatinine, Ser 2.39 (*)    Albumin 3.2 (*)    AST 57 (*)    GFR calc non Af Amer 29 (*)    GFR calc Af Amer 33 (*)    All other components within normal limits  URINALYSIS, ROUTINE W REFLEX MICROSCOPIC - Abnormal; Notable for the following components:   Hgb urine dipstick SMALL (*)    Protein, ur 100 (*)    All other components within normal limits  BRAIN NATRIURETIC PEPTIDE  TROPONIN I  HIV ANTIBODY (ROUTINE TESTING W REFLEX)  RPR  CBG MONITORING, ED    EKG EKG Interpretation  Date/Time:  Monday Sep 20 2018 09:35:57 EDT Ventricular Rate:  75 PR Interval:    QRS Duration: 103 QT Interval:  469 QTC Calculation: 524 R Axis:   -15 Text Interpretation:  Sinus rhythm Borderline left axis deviation Anterior infarct, old Prolonged QT interval No significant change since last tracing Confirmed by [name] [phone]) on 09/20/2018 9:43:57 AM   Radiology Dg Chest Portable 1 View  Result Date: 09/20/2018 CLINICAL DATA:  Swelling, CHF EXAM: PORTABLE CHEST 1 VIEW COMPARISON:  06/29/2016 FINDINGS: The heart size and mediastinal contours are within normal limits. Both lungs are clear. The visualized skeletal structures are unremarkable. IMPRESSION: No active disease. Electronically Signed   By: Kathreen Devoid   On: 09/20/2018 10:15    Procedures Procedures (including critical care time)  Medications Ordered in ED Medications  amLODipine (NORVASC) tablet 10 mg (10 mg Oral Given 09/20/18 1043)  losartan (COZAAR) tablet 50 mg (50 mg Oral Given 09/20/18 1217)     Initial Impression / Assessment and Plan / ED Course  I have reviewed the triage vital signs and the nursing notes.  Pertinent labs & imaging results that were available during my care of the  patient were reviewed by me and considered in my medical decision making (see chart for details).    Troponin negative CBG 83 CBC nonacute BNP within normal limits Urinalysis with protein present, baseline CMP with elevated creatinine continuously worsening from prior, no acute injury DG chest:  IMPRESSION: No active disease. EKG: Sinus rhythm Borderline left axis deviation Anterior infarct, old Prolonged QT interval No significant change since last tracing Confirmed by [name]  - Patient noncompliant with home medications, hypertensive with worsening chronic kidney disease and mild bilateral lower extremity edema.  Is without chest pain, shortness of breath.  Some DOE however unchanged x1 year.  He is ambulate around the emergency department without hypoxia or tachycardia on room air. Patient seen and evaluated by Dr. Ralene Bathe, plan of care at this time is to refill patient's blood pressure medications amlodipine 10 mg daily and losartan 50 mg daily encourage PCP follow-up, leg elevation and compression stocking use.  Patient is aware that continued noncompliance will leave him at high risk for kidney failure, stroke, heart attack and other serious illnesses.  He plans to call his primary care doctor's office tomorrow to schedule follow-up appointment this week.  Patient hypertensive in the emergency department however he is asymptomatic regarding this, I discussed the signs and symptoms of hypertensive urgency/emergency with the patient he states understanding and to return to emergency department immediately if these occur.  He has been started on his blood pressure medication here in the emergency department, p.o.  At this time there does not appear to be any evidence of an acute emergency medical condition and the patient appears stable for discharge with appropriate outpatient follow up. Diagnosis was discussed with patient who verbalizes understanding of care plan and is agreeable to  discharge. I have discussed return precautions with patient who verbalizes understanding of return precautions. Patient encouraged to follow-up with their PCP. All questions answered.  Patient has been discharged in good condition.  Patient's case rediscussed with Dr. Ralene Bathe who agrees with plan to discharge with follow-up.   Note: Portions of this report may have been transcribed using voice recognition software. Every effort was made to ensure accuracy; however, inadvertent computerized transcription errors may still be present. Final Clinical Impressions(s) / ED Diagnoses   Final diagnoses:  Leg swelling  Chronic kidney disease, unspecified CKD stage  Hypertension, unspecified type    ED Discharge Orders         Ordered    losartan (COZAAR) 50 MG tablet  Daily     09/20/18 1116    amLODipine (NORVASC) 10 MG tablet  Daily     09/20/18 1116           Gari Crown 09/20/18 1316    [name], MD 09/22/18 [phone]

## 2018-09-20 NOTE — ED Triage Notes (Signed)
Patient complains of months of bilateral feet and lower leg swelling. States that the pain and swelling worse at night. Denies taking any meds but found hypertensive on assessment. Denies CP, denies SOB. Also complains of rash/on left scalp

## 2018-09-20 NOTE — ED Notes (Signed)
Pt ambulated unassisted around the hallway. Pulse ox mostly stayed at 100%, but went as low as 95% for half a minute at the beginning of the walk.

## 2018-09-20 NOTE — Discharge Instructions (Addendum)
You have been diagnosed today with leg swelling, high blood pressure, kidney disease.  At this time there does not appear to be the presence of an emergent medical condition, however there is always the potential for conditions to change. Please read and follow the below instructions.  Please return to the Emergency Department immediately for any new or worsening symptoms. Please be sure to follow up with your Primary Care Provider within one week regarding your visit today; please call their office to schedule an appointment even if you are feeling better for a follow-up visit. Please take your prescribed blood pressure medication to help lower your blood pressure and risk of worsening of kidney disease.  Please call your primary care doctor's office tomorrow to schedule a follow-up appointment for blood pressure recheck and medication management.  Please read the attached handout regarding high blood pressure.  Return for any new or worsening symptoms.  Get help right away if: You have shortness of breath or chest pain. You cannot breathe when you lie down. You have pain, redness, or warmth in the swollen areas. You have heart, liver, or kidney disease and get edema all of a sudden. You have a fever and your symptoms get worse all of a sudden. Get help right away if: You get a very bad headache. You start to feel confused. You feel weak or numb. You feel faint. You get very bad pain in your: Chest. Belly (abdomen). You throw up (vomit) more than once. You have trouble breathing. Any new/concerning or worsening symptoms  Please read the additional information packets attached to your discharge summary.  Do not take your medicine if  develop an itchy rash, swelling in your mouth or lips, or difficulty breathing. Call 911/seek emergency medical attention immediately if this occurs.

## 2018-09-21 LAB — HIV ANTIBODY (ROUTINE TESTING W REFLEX): HIV Screen 4th Generation wRfx: NONREACTIVE

## 2018-09-23 LAB — RPR, QUANT+TP ABS (REFLEX)
Rapid Plasma Reagin, Quant: 1:1 {titer} — ABNORMAL HIGH
T Pallidum Abs: REACTIVE — AB

## 2018-09-23 LAB — RPR: RPR Ser Ql: REACTIVE — AB

## 2018-10-13 ENCOUNTER — Emergency Department (HOSPITAL_COMMUNITY): Payer: Self-pay

## 2018-10-13 ENCOUNTER — Other Ambulatory Visit: Payer: Self-pay

## 2018-10-13 ENCOUNTER — Emergency Department (HOSPITAL_COMMUNITY)
Admission: EM | Admit: 2018-10-13 | Discharge: 2018-10-13 | Disposition: A | Discharge status: Home / Self Care | Payer: Self-pay | Attending: Emergency Medicine | Admitting: Emergency Medicine

## 2018-10-13 ENCOUNTER — Encounter (HOSPITAL_COMMUNITY): Payer: Self-pay

## 2018-10-13 DIAGNOSIS — E119 Type 2 diabetes mellitus without complications: Secondary | ICD-10-CM | POA: Insufficient documentation

## 2018-10-13 DIAGNOSIS — F17210 Nicotine dependence, cigarettes, uncomplicated: Secondary | ICD-10-CM | POA: Insufficient documentation

## 2018-10-13 DIAGNOSIS — M79671 Pain in right foot: Secondary | ICD-10-CM | POA: Insufficient documentation

## 2018-10-13 DIAGNOSIS — G629 Polyneuropathy, unspecified: Secondary | ICD-10-CM | POA: Insufficient documentation

## 2018-10-13 DIAGNOSIS — M79672 Pain in left foot: Secondary | ICD-10-CM | POA: Insufficient documentation

## 2018-10-13 DIAGNOSIS — R200 Anesthesia of skin: Secondary | ICD-10-CM | POA: Insufficient documentation

## 2018-10-13 DIAGNOSIS — G8929 Other chronic pain: Secondary | ICD-10-CM | POA: Insufficient documentation

## 2018-10-13 DIAGNOSIS — Z7984 Long term (current) use of oral hypoglycemic drugs: Secondary | ICD-10-CM | POA: Insufficient documentation

## 2018-10-13 LAB — CBG MONITORING, ED: Glucose-Capillary: 83 mg/dL (ref 70–99)

## 2018-10-13 MED ORDER — GABAPENTIN 300 MG PO CAPS
300.0000 mg | ORAL_CAPSULE | Freq: Once | ORAL | Status: AC
  Administered 2018-10-13: 300 mg via ORAL
  Filled 2018-10-13: qty 1

## 2018-10-13 MED ORDER — PENICILLIN G BENZATHINE 1200000 UNIT/2ML IM SUSP
2.4000 10*6.[IU] | Freq: Once | INTRAMUSCULAR | Status: AC
  Administered 2018-10-13: 12:00:00 2.4 10*6.[IU] via INTRAMUSCULAR
  Filled 2018-10-13: qty 4

## 2018-10-13 MED ORDER — GABAPENTIN 300 MG PO CAPS: 300.0000 mg | ORAL_CAPSULE | Freq: Three times a day (TID) | ORAL | 0 refills | Status: DC

## 2018-10-13 NOTE — Discharge Instructions (Signed)
Begin taking gabapentin twice daily tomorrow and then change to 3 times daily from there after.  Continue taking your blood pressure medications as prescribed.  It is important that you follow-up and establish care with a primary care provider for further management of your blood pressure, chronic kidney disease, and continue evaluation of your foot pain and neuropathy.  Please follow-up with Dr. Doreatha Martin for further evaluation of your shoulder pain.  I suspect you have a rotator cuff injury as well as arthritis.  Please return to the emergency department if you develop any new or worsening symptoms.

## 2018-10-13 NOTE — ED Triage Notes (Signed)
Complains of bilateral foot pain and right arm pain.  States he was told if he took his blood pressure medicine it would not hurt.  He says painful to walk but hurts all the time.

## 2018-10-13 NOTE — ED Provider Notes (Signed)
[facility] EMERGENCY DEPARTMENT Provider Note   CSN: [id number] Arrival date & time: 10/13/18  0034     History   Chief Complaint Chief Complaint  Patient presents with  . Foot Pain    HPI Daniel [name] is a 58 y.o. male with history of hypertension, diabetes, chronic shoulder pain who presents with chronic bilateral foot pain.  Patient reports this is been going on for months.  It is unchanged from 09/20/2018 when he was evaluated for the same problem.  He reports both feet intermittently swell, although they are not swollen today.  He reports the feet hurt all the time, but worse when he walks.  He describes it as a tingly pain.  He denies any back pain, saddle anesthesia, loss of bowel or bladder control.  He reports he urinates frequently, however this is unchanged.  He does not currently have a PCP.  He is only taking medications for his high blood pressure.     HPI  Past Medical History:  Diagnosis Date  . Diabetes mellitus without complication ([place])   . Hypertension   . Shoulder pain, left 06/28/2013    Patient Active Problem List   Diagnosis Date Noted  . ICH (intracerebral hemorrhage) ([place]) 05/20/2017  . Cellulitis of abdominal wall 06/29/2016  . Abscess of abdominal wall 06/29/2016  . Hypertension 06/29/2016  . DM2 (diabetes mellitus, type 2) ([place]) 06/29/2016  . Sepsis ([place]) 06/29/2016    Past Surgical History:  Procedure Laterality Date  . APPENDECTOMY    . COLONOSCOPY  01/23/2012   Procedure: COLONOSCOPY;  Surgeon: Danie Binder, MD;  Location[facility];  Service: Endoscopy;  Laterality: N/A;  11:10 AM  . INCISION AND DRAINAGE ABSCESS N/A 06/29/2016   Procedure: INCISION AND DRAINAGE ABDOMINAL WALL ABSCESS;  Surgeon: Alphonsa Overall, MD;  Location: [facility] ORS;  Service: General;  Laterality: N/A;  . Left heel surgery          Home Medications    Prior to Admission medications   Medication Sig Start Date End Date Taking? Authorizing  Provider  amLODipine (NORVASC) 10 MG tablet Take 1 tablet (10 mg total) by mouth daily. 09/20/18  Yes Nuala Alpha A, PA-C  losartan (COZAAR) 50 MG tablet Take 1 tablet (50 mg total) by mouth daily. 09/20/18  Yes Nuala Alpha A, PA-C  blood glucose meter kit and supplies KIT Dispense based on patient and insurance preference. Use up to four times daily as directed. (FOR ICD-9 250.00, 250.01). Patient not taking: Reported on 09/20/2018 07/01/16   Nita Sells, MD  cloNIDine (CATAPRES) 0.2 MG tablet Take 1 tablet (0.2 mg total) by mouth 2 (two) times daily. Patient not taking: Reported on 09/20/2018 06/08/17   Clent Demark, PA-C  gabapentin (NEURONTIN) 300 MG capsule Take 1 capsule (300 mg total) by mouth 3 (three) times daily. 10/13/18   Yolando Gillum, Bea Graff, PA-C  hydrALAZINE (APRESOLINE) 50 MG tablet Take 1 tablet (50 mg total) by mouth every 8 (eight) hours. 06/08/17   Clent Demark, PA-C  metFORMIN (GLUCOPHAGE) 500 MG tablet Take 1 tablet (500 mg total) by mouth 2 (two) times daily. Patient not taking: Reported on 09/20/2018 06/08/17   Clent Demark, PA-C    Family History Family History  Problem Relation Age of Onset  . Colon cancer Neg Hx     Social History Social History   Tobacco Use  . Smoking status: Current Every Day Smoker    Packs/day: 1.00  Years: 30.00    Pack years: 30.00    Types: Cigarettes  . Smokeless tobacco: Never Used  Substance Use Topics  . Alcohol use: Yes    Alcohol/week: 12.0 standard drinks    Types: 12 Cans of beer per week  . Drug use: Yes    Types: "Crack" cocaine     Allergies   Patient has no known allergies.   Review of Systems Review of Systems  Constitutional: Negative for chills and fever.  HENT: Negative for facial swelling and sore throat.   Respiratory: Negative for shortness of breath.   Cardiovascular: Negative for chest pain.  Gastrointestinal: Negative for abdominal pain, nausea and vomiting.   Genitourinary: Positive for frequency (chronic). Negative for dysuria.  Musculoskeletal: Positive for arthralgias (chronic shoulder pain). Negative for back pain.  Skin: Negative for rash and wound.  Neurological: Positive for numbness (neuropathy pain to both feet, chronic). Negative for headaches.  Psychiatric/Behavioral: The patient is not nervous/anxious.      Physical Exam Updated Vital Signs BP (!) 174/91 (BP Location: Left Arm)   Pulse 65   Temp 98.6 F (37 C) (Oral)   Resp (!) 22   Ht 5' 10"  (1.778 m)   Wt 108.9 kg   SpO2 98%   BMI 34.44 kg/m   Physical Exam Vitals signs and nursing note reviewed.  Constitutional:      General: He is not in acute distress.    Appearance: He is well-developed. He is not diaphoretic.  HENT:     Head: Normocephalic and atraumatic.     Mouth/Throat:     Pharynx: No oropharyngeal exudate.  Eyes:     General: No scleral icterus.       Right eye: No discharge.        Left eye: No discharge.     Conjunctiva/sclera: Conjunctivae normal.     Pupils: Pupils are equal, round, and reactive to light.  Neck:     Musculoskeletal: Normal range of motion and neck supple.     Thyroid: No thyromegaly.  Cardiovascular:     Rate and Rhythm: Normal rate and regular rhythm.     Heart sounds: Normal heart sounds. No murmur. No friction rub. No gallop.   Pulmonary:     Effort: Pulmonary effort is normal. No respiratory distress.     Breath sounds: Normal breath sounds. No stridor. No wheezing or rales.  Abdominal:     General: Bowel sounds are normal. There is no distension.     Palpations: Abdomen is soft.     Tenderness: There is no abdominal tenderness. There is no guarding or rebound.  Musculoskeletal:     Comments: Tenderness without swelling or erythema to the right shoulder; range of motion is limited Equal bilateral grip strength with 5/5 strength with flexion extension of the upper extremities Tenderness on the left plantar aspect of the  foot over a callus without erythema; otherwise no bony tenderness to the feet bilaterally; no edema noted  Lymphadenopathy:     Cervical: No cervical adenopathy.  Skin:    General: Skin is warm and dry.     Coloration: Skin is not pale.     Findings: No rash.  Neurological:     Mental Status: He is alert.     Coordination: Coordination normal.      ED Treatments / Results  Labs (all labs ordered are listed, but only abnormal results are displayed) Labs Reviewed  CBG MONITORING, ED    EKG  Radiology Dg Shoulder Right  Result Date: 10/13/2018 CLINICAL DATA:  Superior right shoulder pain with no known injury for 1 month. EXAM: RIGHT SHOULDER - 2+ VIEW COMPARISON:  None. FINDINGS: Mild degenerative changes at the rotator cuff insertion site. Minimal glenohumeral degenerative changes. No fracture or dislocation. IMPRESSION: Mild degenerative changes.  No other abnormalities. Electronically Signed   By: Dorise Bullion III M.D   On: 10/13/2018 11:42    Procedures Procedures (including critical care time)  Medications Ordered in ED Medications  gabapentin (NEURONTIN) capsule 300 mg (300 mg Oral Given 10/13/18 1132)  penicillin g benzathine (BICILLIN LA) 1200000 UNIT/2ML injection 2.4 Million Units (2.4 Million Units Intramuscular Given 10/13/18 1133)     Initial Impression / Assessment and Plan / ED Course  I have reviewed the triage vital signs and the nursing notes.  Pertinent labs & imaging results that were available during my care of the patient were reviewed by me and considered in my medical decision making (see chart for details).        Patient presenting with ongoing foot pain.  He appears to have a neuropathy of unknown source.  His CBG is within normal limits today, however he has documented diabetes on the chart.  He is not currently taking metformin.  Will not restart now until A1c can be completed by PCP.  Will start gabapentin and titrate to 300 mg twice  daily.  Right shoulder x-ray shows some degenerative changes at the rotator cuff insertion site, which is equivalent with exam.  Will refer to orthopedics for this.  Patient strongly encouraged to follow-up with the PCP for further management of his chronic illness as well as his previous history of CKD.  Labs that were done 3 weeks ago were stable for the patient.  Do not feel these need to be repeated considering nothing has changed.  Patient did test positive for syphilis.  This was treated with penicillin IM today.  Patient reports he has not been sexually active recently, and does not remember the last time he was, possibly a couple years ago.  Return precautions discussed.  Patient understands and agrees with plan.  Patient vital stable throughout ED course and discharged in satisfactory condition.  I discussed patient case with Dr. Darl Householder who guided the patient's management and agrees with plan.   Final Clinical Impressions(s) / ED Diagnoses   Final diagnoses:  Neuropathy  Chronic right shoulder pain    ED Discharge Orders         Ordered    gabapentin (NEURONTIN) 300 MG capsule  3 times daily     10/13/18 1237           Frederica Kuster, PA-C 10/13/18 1243    Drenda Freeze, MD 10/14/18 [phone]

## 2018-10-13 NOTE — ED Notes (Signed)
CBG 83 

## 2018-10-21 ENCOUNTER — Other Ambulatory Visit: Payer: Self-pay

## 2018-10-21 ENCOUNTER — Emergency Department (HOSPITAL_COMMUNITY)
Admission: EM | Admit: 2018-10-21 | Discharge: 2018-10-21 | Disposition: A | Discharge status: Home / Self Care | Payer: Self-pay | Attending: Emergency Medicine | Admitting: Emergency Medicine

## 2018-10-21 ENCOUNTER — Emergency Department (HOSPITAL_COMMUNITY): Payer: Self-pay

## 2018-10-21 ENCOUNTER — Encounter (HOSPITAL_COMMUNITY): Payer: Self-pay | Admitting: Emergency Medicine

## 2018-10-21 DIAGNOSIS — R600 Localized edema: Secondary | ICD-10-CM | POA: Insufficient documentation

## 2018-10-21 DIAGNOSIS — I10 Essential (primary) hypertension: Secondary | ICD-10-CM | POA: Insufficient documentation

## 2018-10-21 DIAGNOSIS — E119 Type 2 diabetes mellitus without complications: Secondary | ICD-10-CM | POA: Insufficient documentation

## 2018-10-21 DIAGNOSIS — F17210 Nicotine dependence, cigarettes, uncomplicated: Secondary | ICD-10-CM | POA: Insufficient documentation

## 2018-10-21 LAB — CBC WITH DIFFERENTIAL/PLATELET
Abs Immature Granulocytes: 0.01 10*3/uL (ref 0.00–0.07)
Basophils Absolute: 0 10*3/uL (ref 0.0–0.1)
Basophils Relative: 0 %
Eosinophils Absolute: 0.2 10*3/uL (ref 0.0–0.5)
Eosinophils Relative: 3 %
HCT: 32.6 % — ABNORMAL LOW (ref 39.0–52.0)
Hemoglobin: 10.7 g/dL — ABNORMAL LOW (ref 13.0–17.0)
Immature Granulocytes: 0 %
Lymphocytes Relative: 59 %
Lymphs Abs: 3.3 10*3/uL (ref 0.7–4.0)
MCH: 34.5 pg — ABNORMAL HIGH (ref 26.0–34.0)
MCHC: 32.8 g/dL (ref 30.0–36.0)
MCV: 105.2 fL — ABNORMAL HIGH (ref 80.0–100.0)
Monocytes Absolute: 0.7 10*3/uL (ref 0.1–1.0)
Monocytes Relative: 11 %
Neutro Abs: 1.6 10*3/uL — ABNORMAL LOW (ref 1.7–7.7)
Neutrophils Relative %: 27 %
Platelets: 115 10*3/uL — ABNORMAL LOW (ref 150–400)
RBC: 3.1 MIL/uL — ABNORMAL LOW (ref 4.22–5.81)
RDW: 13.7 % (ref 11.5–15.5)
WBC: 5.7 10*3/uL (ref 4.0–10.5)
nRBC: 0 % (ref 0.0–0.2)

## 2018-10-21 LAB — BRAIN NATRIURETIC PEPTIDE: B Natriuretic Peptide: 61.9 pg/mL (ref 0.0–100.0)

## 2018-10-21 LAB — BASIC METABOLIC PANEL
Anion gap: 10 (ref 5–15)
BUN: 34 mg/dL — ABNORMAL HIGH (ref 6–20)
CO2: 19 mmol/L — ABNORMAL LOW (ref 22–32)
Calcium: 8.8 mg/dL — ABNORMAL LOW (ref 8.9–10.3)
Chloride: 109 mmol/L (ref 98–111)
Creatinine, Ser: 2.54 mg/dL — ABNORMAL HIGH (ref 0.61–1.24)
GFR calc Af Amer: 31 mL/min — ABNORMAL LOW (ref >60)
GFR calc non Af Amer: 27 mL/min — ABNORMAL LOW (ref >60)
Glucose, Bld: 106 mg/dL — ABNORMAL HIGH (ref 70–99)
Potassium: 4 mmol/L (ref 3.5–5.1)
Sodium: 138 mmol/L (ref 135–145)

## 2018-10-21 MED ORDER — HYDROCHLOROTHIAZIDE 12.5 MG PO TABS: 12.5000 mg | ORAL_TABLET | Freq: Every day | ORAL | 0 refills | Status: DC

## 2018-10-21 MED ORDER — FUROSEMIDE 20 MG PO TABS
20.0000 mg | ORAL_TABLET | Freq: Once | ORAL | Status: AC
  Administered 2018-10-21: 20 mg via ORAL
  Filled 2018-10-21: qty 1

## 2018-10-21 NOTE — ED Provider Notes (Signed)
[facility] EMERGENCY DEPARTMENT Provider Note   CSN: [id number] Arrival date & time: 10/21/18  1219    History   Chief Complaint Chief Complaint  Patient presents with  . Leg Swelling    HPI Daniel [name] is a 58 y.o. male with past medical history of hypertension, diet controlled diabetes, chronic shoulder pain presents emergency department today with chief complaint of bilateral lower extremity edema x1 week.  Patient states he started taking gabapentin 1 week ago for bilateral foot pain. He states the pain has continued despite gabapentin.  He describes the pain as constant and is worse with walking, states pain is sharp. He takes advil for pain with relief.  He denies any back pain, saddle anesthesia, loss of bowel or bladder control, chest pain, shortness of breath, orthopnea, fever, chills, cough, abdominal pain, nausea, vomiting.  Chart review shows pt has been seen in ED for similar complaint 09/20/2018 and 04/06/2018.  Past Medical History:  Diagnosis Date  . Diabetes mellitus without complication ([place])   . Hypertension   . Shoulder pain, left 06/28/2013    Patient Active Problem List   Diagnosis Date Noted  . ICH (intracerebral hemorrhage) ([place]) 05/20/2017  . Cellulitis of abdominal wall 06/29/2016  . Abscess of abdominal wall 06/29/2016  . Hypertension 06/29/2016  . DM2 (diabetes mellitus, type 2) ([place]) 06/29/2016  . Sepsis ([place]) 06/29/2016    Past Surgical History:  Procedure Laterality Date  . APPENDECTOMY    . COLONOSCOPY  01/23/2012   Procedure: COLONOSCOPY;  Surgeon: Danie Binder, MD;  Location[facility];  Service: Endoscopy;  Laterality: N/A;  11:10 AM  . INCISION AND DRAINAGE ABSCESS N/A 06/29/2016   Procedure: INCISION AND DRAINAGE ABDOMINAL WALL ABSCESS;  Surgeon: Alphonsa Overall, MD;  Location: [facility];  Service: General;  Laterality: N/A;  . Left heel surgery          Home Medications    Prior to Admission medications    Medication Sig Start Date End Date Taking? Authorizing Provider  cloNIDine (CATAPRES) 0.2 MG tablet Take 1 tablet (0.2 mg total) by mouth 2 (two) times daily. 06/08/17  Yes Clent Demark, PA-C  losartan (COZAAR) 50 MG tablet Take 1 tablet (50 mg total) by mouth daily. 09/20/18  Yes Nuala Alpha A, PA-C  amLODipine (NORVASC) 10 MG tablet Take 1 tablet (10 mg total) by mouth daily. 09/20/18 10/21/18 Yes Nuala Alpha A, PA-C  gabapentin (NEURONTIN) 300 MG capsule Take 1 capsule (300 mg total) by mouth 3 (three) times daily. 10/13/18 10/21/18 Yes Law, Bea Graff, PA-C  blood glucose meter kit and supplies KIT Dispense based on patient and insurance preference. Use up to four times daily as directed. (FOR ICD-9 250.00, 250.01). Patient not taking: Reported on 09/20/2018 07/01/16   Nita Sells, MD  hydrochlorothiazide (HYDRODIURIL) 12.5 MG tablet Take 1 tablet (12.5 mg total) by mouth daily for 30 days. 10/21/18 11/20/18  Germani Gavilanes, Harley Hallmark, PA-C    Family History Family History  Problem Relation Age of Onset  . Colon cancer Neg Hx     Social History Social History   Tobacco Use  . Smoking status: Current Every Day Smoker    Packs/day: 1.00    Years: 30.00    Pack years: 30.00    Types: Cigarettes  . Smokeless tobacco: Never Used  Substance Use Topics  . Alcohol use: Yes    Alcohol/week: 12.0 standard drinks    Types: 12 Cans of beer per week  .  Drug use: Yes    Types: "Crack" cocaine     Allergies   Patient has no known allergies.   Review of Systems Review of Systems  Constitutional: Negative for chills and fever.  HENT: Negative for congestion, rhinorrhea, sinus pressure and sore throat.   Eyes: Negative for pain and redness.  Respiratory: Negative for cough, shortness of breath and wheezing.   Cardiovascular: Positive for leg swelling. Negative for chest pain and palpitations.  Gastrointestinal: Negative for abdominal pain, constipation, diarrhea, nausea and  vomiting.  Genitourinary: Negative for dysuria.  Musculoskeletal: Positive for arthralgias. Negative for back pain, myalgias and neck pain.  Skin: Negative for rash and wound.  Neurological: Negative for dizziness, syncope, weakness, numbness and headaches.  Psychiatric/Behavioral: Negative for confusion.     Physical Exam Updated Vital Signs BP 135/90 (BP Location: Right Arm)   Pulse 80   Temp 97.9 F (36.6 C) (Oral)   Resp 18   SpO2 99%   Physical Exam Vitals signs and nursing note reviewed.  Constitutional:      General: He is not in acute distress.    Appearance: He is not ill-appearing.  HENT:     Head: Normocephalic and atraumatic.     Right Ear: Tympanic membrane and external ear normal.     Left Ear: Tympanic membrane and external ear normal.     Nose: Nose normal.     Mouth/Throat:     Mouth: Mucous membranes are moist.     Pharynx: Oropharynx is clear.  Eyes:     General: No scleral icterus.       Right eye: No discharge.        Left eye: No discharge.     Extraocular Movements: Extraocular movements intact.     Conjunctiva/sclera: Conjunctivae normal.     Pupils: Pupils are equal, round, and reactive to light.  Neck:     Musculoskeletal: Normal range of motion.     Vascular: No JVD.  Cardiovascular:     Rate and Rhythm: Normal rate and regular rhythm.     Pulses: Normal pulses.          Radial pulses are 2+ on the right side and 2+ on the left side.       Dorsalis pedis pulses are 2+ on the right side and 2+ on the left side.     Heart sounds: Normal heart sounds.  Pulmonary:     Comments: Lungs clear to auscultation in all fields. Symmetric chest rise. No wheezing, rales, or rhonchi. Abdominal:     Comments: Abdomen is soft, non-distended, and non-tender in all quadrants. No rigidity, no guarding. No peritoneal signs.  Musculoskeletal: Normal range of motion.     Right lower leg: 1+ Edema present.     Left lower leg: 1+ Edema present.     Comments:  Bilateral knees, ankles and feet brought through range of motion without pain or crepitus.  No sign of injury or deformity, no overlying edema, open wounds  Homans sign absent bilaterally. No palpable cords, compartments are soft  Skin:    General: Skin is warm and dry.     Capillary Refill: Capillary refill takes less than 2 seconds.  Neurological:     Mental Status: He is oriented to person, place, and time.     GCS: GCS eye subscore is 4. GCS verbal subscore is 5. GCS motor subscore is 6.     Comments: Fluent speech, no facial droop.  Psychiatric:  Behavior: Behavior normal.      ED Treatments / Results  Labs (all labs ordered are listed, but only abnormal results are displayed) Labs Reviewed  CBC WITH DIFFERENTIAL/PLATELET - Abnormal; Notable for the following components:      Result Value   RBC 3.10 (*)    Hemoglobin 10.7 (*)    HCT 32.6 (*)    MCV 105.2 (*)    MCH 34.5 (*)    Platelets 115 (*)    Neutro Abs 1.6 (*)    All other components within normal limits  BASIC METABOLIC PANEL - Abnormal; Notable for the following components:   CO2 19 (*)    Glucose, Bld 106 (*)    BUN 34 (*)    Creatinine, Ser 2.54 (*)    Calcium 8.8 (*)    GFR calc non Af Amer 27 (*)    GFR calc Af Amer 31 (*)    All other components within normal limits  BRAIN NATRIURETIC PEPTIDE    EKG EKG Interpretation  Date/Time:  Thursday October 21 2018 12:33:57 EDT Ventricular Rate:  62 PR Interval:    QRS Duration: 90 QT Interval:  441 QTC Calculation: 448 R Axis:   -10 Text Interpretation:  Age not entered, assumed to be  58 years old for purpose of ECG interpretation Sinus rhythm Left ventricular hypertrophy No significant change since last tracing Confirmed by Deno Etienne [id number]) on 10/21/2018 1:35:22 PM   Radiology No results found.  Procedures Procedures (including critical care time)  Medications Ordered in ED Medications  furosemide (LASIX) tablet 20 mg (20 mg Oral Given  10/21/18 1418)     Initial Impression / Assessment and Plan / ED Course  I have reviewed the triage vital signs and the nursing notes.  Pertinent labs & imaging results that were available during my care of the patient were reviewed by me and considered in my medical decision making (see chart for details).  58 year old male presents with bilateral lower extremity edema.  Chart review shows history of medication noncompliance.   He is well-appearing, in no acute distress. On exam he has equal 1+ bilateral lower extremity edema. No erythema or calf tenderness noted bilaterally.  Sensation intact to light touch of bilateral lower extremities. There are no findings of cellulitis or infection on exam. Lungs clear to auscultation in all fields, no JVD. I reviewed home medications and he is taking amlodipine and gabapentin. Both could be contributing to his lower extremity edema. Will consult social work as pt has no pcp and needs close follow up for hypertension management. Labs today show glucose 106. He has documented diagnosis of diabetes, but is not on medications for it. Pain is consistent with neuropathy, although source unknown.  Also worsening renal function with BUN/creatinine of  34/2.54 compared to 1 month ago when it was 20/2.39. Will give low dose of PO lasix. On reassessment pt with good urine output. EKG without ischemic changes. Pt discussed with [facility] case Administrator, [employer]. She will attempt to schedule follow up appointment with [facility] and will contact pt. Discontinued amlodipine and gabapentin. Will discharge on HCTZ for hypertension. Verified pt's phone number is [phone].  Patient is hemodynamically stable, in NAD, and able to ambulate in the ED. Evaluation does not show pathology that would require ongoing emergent intervention or inpatient treatment. I explained the diagnosis to the patient.  Patient is comfortable with above plan and is  stable for discharge at this time.  All questions were answered prior to disposition. Strict return precautions for returning to the ED were discussed. Findings and plan of care discussed with supervising physician Dr. Tyrone Nine.   Final Clinical Impressions(s) / ED Diagnoses   Final diagnoses:  Bilateral lower extremity edema    ED Discharge Orders         Ordered    hydrochlorothiazide (HYDRODIURIL) 12.5 MG tablet  Daily     10/21/18 1631           Cherre Robins, PA-C 10/21/18 [place], [place], DO 10/23/18 0745

## 2018-10-21 NOTE — Discharge Instructions (Addendum)
You have been seen today for leg swelling. Please read and follow all provided instructions. Return to the emergency room for worsening condition or new concerning symptoms.    1. Medications:  Prescription sent to your pharmacy for a new blood pressure medication hydrochlorothiazide.  Please take as prescribed. -Please stop taking amlodipine and gabapentin.  These medications could be causing your lower leg swelling. -You can take Tylenol for pain as needed.  Please take as directed. -Continue other usual home medications Take medications as prescribed. Please review all of the medicines and only take them if you do not have an allergy to them.   2. Treatment: rest, drink plenty of fluids 3. Follow Up: Please follow up the [facility].  Our case manager is working on getting you an appointment scheduled for tomorrow or the next day.  They will be in contact with you.  I have also included the information for the clinic.  If you do not hear from case management I recommend you call and schedule an appointment to be seen at the clinic. -We started you on a blood pressure medication however it is very possible you will need dose changes to best control your blood pressure.  This is important that you see you primary care doctor for long-term management of your blood pressure.  It is also a possibility that you have an allergic reaction to any of the medicines that you have been prescribed - Everybody reacts differently to medications and while MOST people have no trouble with most medicines, you may have a reaction such as nausea, vomiting, rash, swelling, shortness of breath. If this is the case, please stop taking the medicine immediately and contact your physician.  ?

## 2018-10-21 NOTE — ED Triage Notes (Signed)
Patient arrived from home with reports of bilateral lower extremities swelling, stating "they gave me some medicine last week that made me swell." Patient is referring to  gabapentin (at bedside)

## 2018-10-21 NOTE — Progress Notes (Signed)
[facility] T[facility] CM -referral PCP  Referral received from attending, pt will need follow up with PCP for HTN. Will call [facility] on 10/22/2018 to arrange appt. Will call pt with appt time.  Jonnie Finner RN CCM Case Mgmt phone [phone]

## 2018-10-21 NOTE — ED Notes (Signed)
Patient verbalizes understanding of discharge instructions. Opportunity for questioning and answers were provided. Armband removed by staff, pt discharged from ED.  

## 2018-10-22 ENCOUNTER — Telehealth: Payer: Self-pay | Admitting: *Deleted

## 2018-10-22 NOTE — Telephone Encounter (Signed)
[facility] -referral PCP/3 ED visits in six months  Arranged appt for pt's clinic, [facility] on 11/01/2018 at 1:50 pm. Called number and phone disconnected. Attempted call to brother's number and no answer. Will continue to follow up to give information about appt. Jonnie Finner RN CCM Case Mgmt phone [phone]

## 2018-11-01 ENCOUNTER — Ambulatory Visit (INDEPENDENT_AMBULATORY_CARE_PROVIDER_SITE_OTHER): Payer: Self-pay | Admitting: Primary Care

## 2018-11-01 ENCOUNTER — Encounter (INDEPENDENT_AMBULATORY_CARE_PROVIDER_SITE_OTHER): Payer: Self-pay | Admitting: Primary Care

## 2018-11-01 ENCOUNTER — Other Ambulatory Visit: Payer: Self-pay

## 2018-11-01 ENCOUNTER — Other Ambulatory Visit (INDEPENDENT_AMBULATORY_CARE_PROVIDER_SITE_OTHER): Payer: Self-pay | Admitting: Primary Care

## 2018-11-01 VITALS — BP 152/83 | HR 91 | Temp 98.2°F | Ht 70.0 in | Wt 251.6 lb

## 2018-11-01 DIAGNOSIS — Z720 Tobacco use: Secondary | ICD-10-CM

## 2018-11-01 DIAGNOSIS — M2550 Pain in unspecified joint: Secondary | ICD-10-CM

## 2018-11-01 DIAGNOSIS — L259 Unspecified contact dermatitis, unspecified cause: Secondary | ICD-10-CM

## 2018-11-01 DIAGNOSIS — I10 Essential (primary) hypertension: Secondary | ICD-10-CM

## 2018-11-01 DIAGNOSIS — E119 Type 2 diabetes mellitus without complications: Secondary | ICD-10-CM

## 2018-11-01 LAB — GLUCOSE, POCT (MANUAL RESULT ENTRY): POC Glucose: 167 mg/dl — AB (ref 70–99)

## 2018-11-01 LAB — POCT GLYCOSYLATED HEMOGLOBIN (HGB A1C): Hemoglobin A1C: 5.8 % — AB (ref 4.0–5.6)

## 2018-11-01 MED ORDER — LOSARTAN POTASSIUM 50 MG PO TABS: 50.0000 mg | ORAL_TABLET | Freq: Every day | ORAL | 3 refills | Status: DC

## 2018-11-01 MED ORDER — TRIAMCINOLONE ACETONIDE 0.1 % EX CREA: 1.0000 "application " | TOPICAL_CREAM | Freq: Two times a day (BID) | CUTANEOUS | 0 refills | Status: DC

## 2018-11-01 MED ORDER — HYDROCHLOROTHIAZIDE 25 MG PO TABS: 25.0000 mg | ORAL_TABLET | Freq: Every day | ORAL | 3 refills | Status: DC

## 2018-11-01 MED ORDER — LISINOPRIL 10 MG PO TABS: 10.0000 mg | ORAL_TABLET | Freq: Every day | ORAL | Status: DC

## 2018-11-01 MED ORDER — HYDROCHLOROTHIAZIDE 12.5 MG PO TABS: 25.0000 mg | ORAL_TABLET | Freq: Every day | ORAL | 0 refills | Status: DC

## 2018-11-01 NOTE — Progress Notes (Signed)
Established Patient Office Visit  Subjective:  Patient ID: Daniel [name], male    DOB: 1960/11/21  Age: 58 y.o. MRN: [id number]  CC:  Chief Complaint  Patient presents with  . Hospitalization Follow-up    neuropathy/bilateral lower extremity edema     HPI [name] presents to the emergency room for bilateral swelling of his feet and shoulder pain. Pain has become worst no stopping pain  8/10 and hurts worst at night. Nothing makes the pain feel better. Past medical history below.  Past Medical History:  Diagnosis Date  . Diabetes mellitus without complication ([place])   . Hypertension   . Shoulder pain, left 06/28/2013    Past Surgical History:  Procedure Laterality Date  . APPENDECTOMY    . COLONOSCOPY  01/23/2012   Procedure: COLONOSCOPY;  Surgeon: Danie Binder, MD;  Location: [facility];  Service: Endoscopy;  Laterality: N/A;  11:10 AM  . INCISION AND DRAINAGE ABSCESS N/A 06/29/2016   Procedure: INCISION AND DRAINAGE ABDOMINAL WALL ABSCESS;  Surgeon: Alphonsa Overall, MD;  Location: [facility];  Service: General;  Laterality: N/A;  . Left heel surgery      Family History  Problem Relation Age of Onset  . Colon cancer Neg Hx     Social History   Socioeconomic History  . Marital status: Single    Spouse name: Not on file  . Number of children: Not on file  . Years of education: Not on file  . Highest education level: Not on file  Occupational History  . Not on file  Social Needs  . Financial resource strain: Not on file  . Food insecurity    Worry: Not on file    Inability: Not on file  . Transportation needs    Medical: Not on file    Non-medical: Not on file  Tobacco Use  . Smoking status: Current Every Day Smoker    Packs/day: 1.00    Years: 30.00    Pack years: 30.00    Types: Cigarettes  . Smokeless tobacco: Never Used  Substance and Sexual Activity  . Alcohol use: Yes    Alcohol/week: 12.0 standard drinks    Types: 12 Cans of beer per  week  . Drug use: Yes    Types: "Crack" cocaine  . Sexual activity: Yes    Birth control/protection: None  Lifestyle  . Physical activity    Days per week: Not on file    Minutes per session: Not on file  . Stress: Not on file  Relationships  . Social Herbalist on phone: Not on file    Gets together: Not on file    Attends religious service: Not on file    Active member of club or organization: Not on file    Attends meetings of clubs or organizations: Not on file    Relationship status: Not on file  . Intimate partner violence    Fear of current or ex partner: Not on file    Emotionally abused: Not on file    Physically abused: Not on file    Forced sexual activity: Not on file  Other Topics Concern  . Not on file  Social History Narrative  . Not on file    Outpatient Medications Prior to Visit  Medication Sig Dispense Refill  . hydrochlorothiazide (HYDRODIURIL) 12.5 MG tablet Take 1 tablet (12.5 mg total) by mouth daily for 30 days. 30 tablet 0  . blood glucose  meter kit and supplies KIT Dispense based on patient and insurance preference. Use up to four times daily as directed. (FOR ICD-9 250.00, 250.01). (Patient not taking: Reported on 09/20/2018) 1 each 0  . cloNIDine (CATAPRES) 0.2 MG tablet Take 1 tablet (0.2 mg total) by mouth 2 (two) times daily. (Patient not taking: Reported on 11/01/2018) 60 tablet 11  . losartan (COZAAR) 50 MG tablet Take 1 tablet (50 mg total) by mouth daily. (Patient not taking: Reported on 11/01/2018) 30 tablet 0   No facility-administered medications prior to visit.     No Known Allergies  ROS Review of Systems  Cardiovascular: Positive for leg swelling.  Endocrine: Positive for polyuria.  Genitourinary: Positive for frequency.  Musculoskeletal:       Shoulder and feet   Psychiatric/Behavioral: Positive for sleep disturbance.      Objective:    Physical Exam  Constitutional: He is oriented to person, place, and time. He  appears well-developed and well-nourished.  HENT:  Right Ear: External ear normal.  Neck: Neck supple.  Cardiovascular: Normal rate and regular rhythm.  Pulmonary/Chest: Effort normal.  Abdominal: Soft. Bowel sounds are normal. He exhibits distension.  Musculoskeletal:        General: Edema present.  Neurological: He is oriented to person, place, and time.  Psychiatric: He has a normal mood and affect.    BP (!) 152/83 (BP Location: Left Arm, Patient Position: Sitting, Cuff Size: Normal)   Pulse 91   Temp 98.2 F (36.8 C) (Oral)   Ht 5' 10"  (1.778 m)   Wt 251 lb 9.6 oz (114.1 kg)   SpO2 100%   BMI 36.10 kg/m  Wt Readings from Last 3 Encounters:  11/01/18 251 lb 9.6 oz (114.1 kg)  10/13/18 240 lb (108.9 kg)  06/08/17 265 lb (120.2 kg)     Health Maintenance Due  Topic Date Due  . FOOT EXAM  11/09/1969  . OPHTHALMOLOGY EXAM  11/09/1969  . HEMOGLOBIN A1C  11/18/2017    There are no preventive care reminders to display for this patient.  Lab Results  Component Value Date   TSH 2.833 06/29/2016   Lab Results  Component Value Date   WBC 5.7 10/21/2018   HGB 10.7 (L) 10/21/2018   HCT 32.6 (L) 10/21/2018   MCV 105.2 (H) 10/21/2018   PLT 115 (L) 10/21/2018   Lab Results  Component Value Date   NA 138 10/21/2018   K 4.0 10/21/2018   CO2 19 (L) 10/21/2018   GLUCOSE 106 (H) 10/21/2018   BUN 34 (H) 10/21/2018   CREATININE 2.54 (H) 10/21/2018   BILITOT 0.4 09/20/2018   ALKPHOS 43 09/20/2018   AST 57 (H) 09/20/2018   ALT 29 09/20/2018   PROT 7.8 09/20/2018   ALBUMIN 3.2 (L) 09/20/2018   CALCIUM 8.8 (L) 10/21/2018   ANIONGAP 10 10/21/2018   Lab Results  Component Value Date   CHOL 140 05/21/2017   Lab Results  Component Value Date   HDL 39 (L) 05/21/2017   Lab Results  Component Value Date   LDLCALC 50 05/21/2017   Lab Results  Component Value Date   TRIG 220 (H) 05/22/2017   Lab Results  Component Value Date   CHOLHDL 3.6 05/21/2017   Lab  Results  Component Value Date   HGBA1C 6.7 (H) 05/21/2017      Assessment & Plan:   Problem List Items Addressed This Visit    DM2 (diabetes mellitus, type 2) ([place]) - Primary   Relevant  Orders   HgB A1c   Glucose (CBG) (Completed)    [name] was seen today for hospitalization follow-up.  Diagnoses and all orders for this visit:  Type 2 diabetes mellitus without complication, without long-term current use of insulin ([place]) ADA recommends the following therapeutic goals for glycemic control related to A1c measurements: Goal of therapy: Less than 6.5 hemoglobin A1c.  Reference clinical practice recommendations. Foods that are high in carbohydrates are the following rice, potatoes, breads, sugars, and pastas.  Reduction in the intake (eating) will assist in lowering your blood sugars. -     HgB A1c -     Glucose (CBG)  Hypertension, unspecified type Counseled on blood pressure goal of less than 130/80, low-sodium, DASH diet, medication compliance, 150 minutes of moderate intensity exercise per week. Discussed medication compliance, adverse effects.  Tobacco abuse Nicotine can decrease circulation in your body and affect every organ. Researched shows increase in lung cancer and respiratory problems. When you are ready to stop let's talk.  Arthralgia, unspecified joint Arteria v/s venous status causing decrease blood circulation to lower extremities. Pain is worst at night. Would [facility] to have [facility]     No orders of the defined types were placed in this encounter.   Follow-up: No follow-ups on file.    [name], NP

## 2018-11-05 ENCOUNTER — Ambulatory Visit: Payer: Self-pay

## 2018-11-29 ENCOUNTER — Ambulatory Visit (INDEPENDENT_AMBULATORY_CARE_PROVIDER_SITE_OTHER): Payer: Self-pay | Admitting: Primary Care

## 2018-12-01 ENCOUNTER — Encounter (INDEPENDENT_AMBULATORY_CARE_PROVIDER_SITE_OTHER): Payer: Self-pay | Admitting: Primary Care

## 2018-12-01 ENCOUNTER — Ambulatory Visit (INDEPENDENT_AMBULATORY_CARE_PROVIDER_SITE_OTHER): Payer: Self-pay | Admitting: Primary Care

## 2018-12-01 ENCOUNTER — Other Ambulatory Visit: Payer: Self-pay

## 2018-12-01 VITALS — BP 152/85 | HR 79 | Temp 98.6°F | Ht 70.0 in | Wt 247.6 lb

## 2018-12-01 DIAGNOSIS — I10 Essential (primary) hypertension: Secondary | ICD-10-CM

## 2018-12-01 DIAGNOSIS — R7303 Prediabetes: Secondary | ICD-10-CM

## 2018-12-01 DIAGNOSIS — G629 Polyneuropathy, unspecified: Secondary | ICD-10-CM

## 2018-12-01 DIAGNOSIS — L409 Psoriasis, unspecified: Secondary | ICD-10-CM

## 2018-12-01 MED ORDER — GABAPENTIN 300 MG PO CAPS: 300.0000 mg | ORAL_CAPSULE | Freq: Three times a day (TID) | ORAL | 3 refills | Status: DC

## 2018-12-01 MED ORDER — AMLODIPINE BESYLATE 10 MG PO TABS: 10.0000 mg | ORAL_TABLET | Freq: Every day | ORAL | 0 refills | Status: DC

## 2018-12-01 NOTE — Patient Instructions (Addendum)
Psoriasis Psoriasis is a long-term (chronic) skin condition. It occurs because your body's defense system (immune system) causes skin cells to form too quickly. This causes raised, red patches (plaques) on your skin that look silvery. The patches may be on all areas of your body. They can be any size or shape. Psoriasis can come and go. It can range from mild to very bad. It cannot be passed from one person to another (is not contagious). There is no cure for this condition, but it can be helped with treatment. What are the causes? The cause of psoriasis is not known. Some things can make it worse. These are:  Skin damage, such as cuts, scrapes, sunburn, and dryness.  Not getting enough sunlight.  Some medicines.  Alcohol.  Tobacco.  Stress.  Infections. What increases the risk?  Having a family member with psoriasis.  Being very overweight (obese).  Being 20-40 years old.  Taking certain medicines. What are the signs or symptoms? There are different types of psoriasis. The types are:  Plaque. This is the most common. Symptoms include red, raised patches with a silvery coating. These may be itchy. Your nails may be crumbly or fall off.  Guttate. Symptoms include small red spots on your stomach area, arms, and legs. These may happen after you have been sick, such as with strep throat.  Inverse. Symptoms include patches in your armpits, under your breasts, private areas, or on your butt.  Pustular. Symptoms include pus-filled bumps on the palms of your hands or the soles of your feet. You also may feel very tired, weak, have a fever, and not be hungry.  Erythrodermic. Symptoms include bright red skin that looks burned. You may have a fast heartbeat and a body temperature that is too high or too low. You may be itchy or in pain.  Sebopsoriasis. Symptoms include red patches on your scalp, forehead, and face that are greasy.  Psoriatic arthritis. Symptoms include swollen,  painful joints along with scaly skin patches. How is this treated? There is no cure for this condition, but treatment can:  Help your skin heal.  Lessen itching and irritation and swelling (inflammation).  Slow the growth of new skin cells.  Help your body's defense system respond better to your skin. Treatment may include:  Creams or ointments.  Light therapy. This may include natural sunlight or light therapy in a doctor's office.  Medicines. These can help your body better manage skin cells. They may be used with light therapy or ointments. Medicines may include pills or injections. You may also get antibiotic medicines if you have an infection. Follow these instructions at home: Skin Care  Apply lotion to your skin as needed. Only use those that your doctor has said are okay.  Apply cool, wet cloths (cold compresses) to the affected areas.  Do not use a hot tub or take hot showers. Use slightly warm, not hot, water when taking showers and baths.  Do not scratch your skin. Lifestyle   Do not use any products that contain nicotine or tobacco, such as cigarettes, e-cigarettes, and chewing tobacco. If you need help quitting, ask your doctor.  Lower your stress.  Keep a healthy weight.  Go out in the sun as told by your doctor. Do not get sunburned.  Join a support group. Medicines  Take or use over-the-counter and prescription medicines only as told by your doctor.  If you were prescribed an antibiotic medicine, take it as told by your doctor.   Do not stop using the antibiotic even if you start to feel better. Alcohol use If you drink alcohol:  Limit how much you use: ? 0-1 drink a day for women. ? 0-2 drinks a day for men.  Be aware of how much alcohol is in your drink. In the [place] one drink equals one 12 oz bottle of beer (355 mL), one 5 oz glass of wine (148 mL), or one 1 oz glass of hard liquor (44 mL). General instructions  Keep a journal to track the  things that cause symptoms (triggers). Try to avoid these things.  See a counselor if you feel the support would help.  Keep all follow-up visits as told by your doctor. This is important. Contact a doctor if:  You have a fever.  Your pain gets worse.  You have more redness or warmth in the affected areas.  You have new or worse pain or stiffness in your joints.  Your nails start to break easily or pull away from the nail bed.  You feel very sad (depressed). Summary  Psoriasis is a long-term (chronic) skin condition.  There is no cure for this condition, but treatment can help manage it.  Keep a journal to track the things that cause symptoms.  Take or use over-the-counter and prescription medicines only as told by your doctor.  Keep all follow-up visits as told by your doctor. This is important. This information is not intended to replace advice given to you by your health care provider. Make sure you discuss any questions you have with your health care provider. Document Released: 05/22/2004 Document Revised: 02/16/2018 Document Reviewed: 02/16/2018 Elsevier Patient Education  [place].  Psoriasis Psoriasis is a long-term (chronic) skin condition. It occurs because your body's defense system (immune system) causes skin cells to form too quickly. This causes raised, red patches (plaques) on your skin that look silvery. The patches may be on all areas of your body. They can be any size or shape. Psoriasis can come and go. It can range from mild to very bad. It cannot be passed from one person to another (is not contagious). There is no cure for this condition, but it can be helped with treatment. What are the causes? The cause of psoriasis is not known. Some things can make it worse. These are:  Skin damage, such as cuts, scrapes, sunburn, and dryness.  Not getting enough sunlight.  Some medicines.  Alcohol.  Tobacco.  Stress.  Infections. What increases  the risk?  Having a family member with psoriasis.  Being very overweight (obese).  Being 78-70 years old.  Taking certain medicines. What are the signs or symptoms? There are different types of psoriasis. The types are:  Plaque. This is the most common. Symptoms include red, raised patches with a silvery coating. These may be itchy. Your nails may be crumbly or fall off.  Guttate. Symptoms include small red spots on your stomach area, arms, and legs. These may happen after you have been sick, such as with strep throat.  Inverse. Symptoms include patches in your armpits, under your breasts, private areas, or on your butt.  Pustular. Symptoms include pus-filled bumps on the palms of your hands or the soles of your feet. You also may feel very tired, weak, have a fever, and not be hungry.  Erythrodermic. Symptoms include bright red skin that looks burned. You may have a fast heartbeat and a body temperature that is too high or too low. You  may be itchy or in pain.  Sebopsoriasis. Symptoms include red patches on your scalp, forehead, and face that are greasy.  Psoriatic arthritis. Symptoms include swollen, painful joints along with scaly skin patches. How is this treated? There is no cure for this condition, but treatment can:  Help your skin heal.  Lessen itching and irritation and swelling (inflammation).  Slow the growth of new skin cells.  Help your body's defense system respond better to your skin. Treatment may include:  Creams or ointments.  Light therapy. This may include natural sunlight or light therapy in a doctor's office.  Medicines. These can help your body better manage skin cells. They may be used with light therapy or ointments. Medicines may include pills or injections. You may also get antibiotic medicines if you have an infection. Follow these instructions at home: Skin Care  Apply lotion to your skin as needed. Only use those that your doctor has said are  okay.  Apply cool, wet cloths (cold compresses) to the affected areas.  Do not use a hot tub or take hot showers. Use slightly warm, not hot, water when taking showers and baths.  Do not scratch your skin. Lifestyle   Do not use any products that contain nicotine or tobacco, such as cigarettes, e-cigarettes, and chewing tobacco. If you need help quitting, ask your doctor.  Lower your stress.  Keep a healthy weight.  Go out in the sun as told by your doctor. Do not get sunburned.  Join a support group. Medicines  Take or use over-the-counter and prescription medicines only as told by your doctor.  If you were prescribed an antibiotic medicine, take it as told by your doctor. Do not stop using the antibiotic even if you start to feel better. Alcohol use If you drink alcohol:  Limit how much you use: ? 0-1 drink a day for women. ? 0-2 drinks a day for men.  Be aware of how much alcohol is in your drink. In the [place] one drink equals one 12 oz bottle of beer (355 mL), one 5 oz glass of wine (148 mL), or one 1 oz glass of hard liquor (44 mL). General instructions  Keep a journal to track the things that cause symptoms (triggers). Try to avoid these things.  See a counselor if you feel the support would help.  Keep all follow-up visits as told by your doctor. This is important. Contact a doctor if:  You have a fever.  Your pain gets worse.  You have more redness or warmth in the affected areas.  You have new or worse pain or stiffness in your joints.  Your nails start to break easily or pull away from the nail bed.  You feel very sad (depressed). Summary  Psoriasis is a long-term (chronic) skin condition.  There is no cure for this condition, but treatment can help manage it.  Keep a journal to track the things that cause symptoms.  Take or use over-the-counter and prescription medicines only as told by your doctor.  Keep all follow-up visits as told by your  doctor. This is important. This information is not intended to replace advice given to you by your health care provider. Make sure you discuss any questions you have with your health care provider. Document Released: 05/22/2004 Document Revised: 02/16/2018 Document Reviewed: 02/16/2018 Elsevier Patient Education  2020 [employer].

## 2018-12-01 NOTE — Progress Notes (Signed)
Established Patient Office Visit  Subjective:  Patient ID: Daniel [name], male    DOB: Nov 15, 1960  Age: 58 y.o. MRN: [id number]  CC:  Chief Complaint  Patient presents with  . Follow-up    HTN  . Foot Pain    bilateral     HPI Daniel [name] presents for blood pressure follow up remains elevated added norvasc 73m He denies shortness of breath, headaches, chest pain or lower extremity edema. He is complaining of pins and needs in his feet. Daniel [name] was seen today for follow-up and foot pain.  Past Medical History:  Diagnosis Date  . Diabetes mellitus without complication ([place]   . Hypertension   . Shoulder pain, left 06/28/2013    Past Surgical History:  Procedure Laterality Date  . APPENDECTOMY    . COLONOSCOPY  01/23/2012   Procedure: COLONOSCOPY;  Surgeon: SDanie Binder MD;  [facility]: [facility];  Service: Endoscopy;  Laterality: N/A;  11:10 AM  . INCISION AND DRAINAGE ABSCESS N/A 06/29/2016   Procedure: INCISION AND DRAINAGE ABDOMINAL WALL ABSCESS;  Surgeon: Daniel [name] Overall MD;  Location: [facility];  Service: General;  Laterality: N/A;  . Left heel surgery      Family History  Problem Relation Age of Onset  . Colon cancer Neg Hx     Social History   Socioeconomic History  . Marital status: Single    Spouse name: Not on file  . Number of children: Not on file  . Years of education: Not on file  . Highest education level: Not on file  Occupational History  . Not on file  Social Needs  . Financial resource strain: Not on file  . Food insecurity    Worry: Not on file    Inability: Not on file  . Transportation needs    Medical: Not on file    Non-medical: Not on file  Tobacco Use  . Smoking status: Current Every Day Smoker    Packs/day: 1.00    Years: 30.00    Pack years: 30.00    Types: Cigarettes  . Smokeless tobacco: Never Used  Substance and Sexual Activity  . Alcohol use: Yes    Alcohol/week: 12.0 standard drinks    Types: 12 Cans of beer  per week  . Drug use: Yes    Types: "Crack" cocaine  . Sexual activity: Yes    Birth control/protection: None  Lifestyle  . Physical activity    Days per week: Not on file    Minutes per session: Not on file  . Stress: Not on file  Relationships  . Social [place] phone: Not on file    Gets together: Not on file    Attends religious service: Not on file    Active member of club or organization: Not on file    Attends meetings of clubs or organizations: Not on file    Relationship status: Not on file  . Intimate partner violence    Fear of current or ex partner: Not on file    Emotionally abused: Not on file    Physically abused: Not on file    Forced sexual activity: Not on file  Other Topics Concern  . Not on file  Social History Narrative  . Not on file    Outpatient Medications Prior to Visit  Medication Sig Dispense Refill  . hydrochlorothiazide (HYDRODIURIL) 25 MG tablet Take 1 tablet (25 mg total) by mouth daily. 90 tablet 3  .  losartan (COZAAR) 50 MG tablet Take 1 tablet (50 mg total) by mouth daily. 30 tablet 3  . blood glucose meter kit and supplies KIT Dispense based on patient and insurance preference. Use up to four times daily as directed. (FOR ICD-9 250.00, 250.01). (Patient not taking: Reported on 09/20/2018) 1 each 0  . cloNIDine (CATAPRES) 0.2 MG tablet Take 1 tablet (0.2 mg total) by mouth 2 (two) times daily. (Patient not taking: Reported on 11/01/2018) 60 tablet 11  . triamcinolone cream (KENALOG) 0.1 % Apply 1 application topically 2 (two) times daily. (Patient not taking: Reported on 12/01/2018) 30 g 0   No facility-administered medications prior to visit.     No Known Allergies  ROS Review of Systems  All other systems reviewed and are negative.     Objective:    Physical Exam  Constitutional: He is oriented to person, place, and time. He appears well-developed and well-nourished.  HENT:  Head: Normocephalic.  Neck: Normal range  of motion. Neck supple.  Cardiovascular: Normal rate and regular rhythm.  Pulmonary/Chest: Effort normal and breath sounds normal.  Abdominal: Soft. Bowel sounds are normal. He exhibits distension.  Musculoskeletal: Normal range of motion.  Neurological: He is oriented to person, place, and time.  Skin: Skin is warm and dry.  Psychiatric: He has a normal mood and affect.    BP (!) 152/85 (BP Location: Left Arm, Patient Position: Sitting, Cuff Size: Large)   Pulse 79   Temp 98.6 F (37 C) (Tympanic)   Ht _0  (1.778 m)   Wt 247 lb 9.6 oz (112.3 kg)   SpO2 97%   BMI 35.53 kg/m  Wt Readings from Last 3 Encounters:  12/01/18 247 lb 9.6 oz (112.3 kg)  11/01/18 251 lb 9.6 oz (114.1 kg)  10/13/18 240 lb (108.9 kg)     Health Maintenance Due  Topic Date Due  . FOOT EXAM  11/09/1969  . OPHTHALMOLOGY EXAM  11/09/1969  . INFLUENZA VACCINE  11/27/2018    There are no preventive care reminders to display for this patient.  Lab Results  Component Value Date   TSH 2.833 06/29/2016   Lab Results  Component Value Date   WBC 5.7 10/21/2018   HGB 10.7 (L) 10/21/2018   HCT 32.6 (L) 10/21/2018   MCV 105.2 (H) 10/21/2018   PLT 115 (L) 10/21/2018   Lab Results  Component Value Date   NA 138 10/21/2018   K 4.0 10/21/2018   CO2 19 (L) 10/21/2018   GLUCOSE 106 (H) 10/21/2018   BUN 34 (H) 10/21/2018   CREATININE 2.54 (H) 10/21/2018   BILITOT 0.4 09/20/2018   ALKPHOS 43 09/20/2018   AST 57 (H) 09/20/2018   ALT 29 09/20/2018   PROT 7.8 09/20/2018   ALBUMIN 3.2 (L) 09/20/2018   CALCIUM 8.8 (L) 10/21/2018   ANIONGAP 10 10/21/2018   Lab Results  Component Value Date   CHOL 140 05/21/2017   Lab Results  Component Value Date   HDL 39 (L) 05/21/2017   Lab Results  Component Value Date   LDLCALC 50 05/21/2017   Lab Results  Component Value Date   TRIG 220 (H) 05/22/2017   Lab Results  Component Value Date   CHOLHDL 3.6 05/21/2017   Lab Results  Component Value Date    HGBA1C 5.8 (A) 11/01/2018   Assessment & Plan:   Junius was seen today for follow-up and foot pain.  Diagnoses and all orders for this visit:  Prediabetes ADA recommends the  following therapeutic goals for glycemic control related to A1c measurements: Goal of therapy: Less than 6.5 hemoglobin A1c.  Reference clinical practice recommendations. Foods that are high in carbohydrates are the following rice, potatoes, breads, sugars, and pastas.  Reduction in the intake (eating) will assist in lowering your blood sugars.  Hypertension, unspecified type Counseled on blood pressure goal of less than 130/80, low-sodium, DASH diet, medication compliance, 150 minutes of moderate intensity exercise per week. Discussed medication compliance, adverse effects.  Psoriasis Thisis hypersensitivity reaction to a substance causing cellular immunity response. This may cause papules, vesicles, bullae with surrounding erythema and pruritic .Conservative treatment luke warm baths, Aveeno oatmeal bath or emollients. Medications can be discussed at this visit- antihistamine or pruritic medications. Causes may be contributed to changes in soaps, deodorants, laundry detergent or environmental- grass, weeds and trees.  Neuropathy Peripheral neuropathy develops slowly over time leading to a loss of sensation. Burning, stabbing, or aching pain in the legs or feet are common symptoms.This can lead to calluses or sores on areas of constant pressure, reduced ability to feel temperature changes. Diabetic foot exam should be done at each visit.   Other orders -     amLODipine (NORVASC) 10 MG tablet; Take 1 tablet (10 mg total) by mouth daily. -     gabapentin (NEURONTIN) 300 MG capsule; Take 1 capsule (300 mg total) by mouth 3 (three) times daily.  Follow-up: Return in about 2 years (around 11/30/2020) for Re check BP added Norvasc.    [name], NP

## 2018-12-15 ENCOUNTER — Telehealth (INDEPENDENT_AMBULATORY_CARE_PROVIDER_SITE_OTHER): Payer: Self-pay

## 2018-12-15 ENCOUNTER — Ambulatory Visit (INDEPENDENT_AMBULATORY_CARE_PROVIDER_SITE_OTHER): Payer: Self-pay | Admitting: Primary Care

## 2018-12-15 NOTE — Telephone Encounter (Signed)
FWD to PCP. Jorah Hua S [place], CMA  

## 2018-12-15 NOTE — Telephone Encounter (Signed)
Patient called to inform that gabapentin (NEURONTIN) 300 MG capsule  Is not helping him with pain. Patient had an appointment today but when front office rep checked his temperature at the door his temperature was 100.4. Patient had to reschedule appointment for 8-27. Patient would like to know if PCP can prescribe a different medication for pain.    Please advice [phone]   Patient use [place], [address]  Thank you Whitney Post

## 2018-12-17 ENCOUNTER — Other Ambulatory Visit (INDEPENDENT_AMBULATORY_CARE_PROVIDER_SITE_OTHER): Payer: Self-pay | Admitting: Primary Care

## 2018-12-17 NOTE — Telephone Encounter (Signed)
Only medication he can take is OTC tylenol 500mg  twice daily no NSAIDS he has a elevated creatinine

## 2018-12-17 NOTE — Telephone Encounter (Signed)
Left voicemail asking patient to return call to [facility] at [phone]. Nat Christen, CMA

## 2018-12-17 NOTE — Telephone Encounter (Signed)
This med was sent 12/01/2018 with 3 refills

## 2018-12-17 NOTE — Telephone Encounter (Signed)
Patient is aware of refills. He states that the medication is not helping and would like something different for pain. Nat Christen, CMA

## 2018-12-22 NOTE — Telephone Encounter (Signed)
Will discuss with patient at [place] on 12/23/2018. Nat Christen, CMA

## 2018-12-23 ENCOUNTER — Ambulatory Visit (INDEPENDENT_AMBULATORY_CARE_PROVIDER_SITE_OTHER): Payer: Self-pay | Admitting: Primary Care

## 2019-01-14 ENCOUNTER — Other Ambulatory Visit (INDEPENDENT_AMBULATORY_CARE_PROVIDER_SITE_OTHER): Payer: Self-pay | Admitting: Primary Care

## 2019-01-14 MED ORDER — AMLODIPINE BESYLATE 10 MG PO TABS: 10.0000 mg | ORAL_TABLET | Freq: Every day | ORAL | 0 refills | Status: DC

## 2019-01-17 ENCOUNTER — Encounter (INDEPENDENT_AMBULATORY_CARE_PROVIDER_SITE_OTHER): Payer: Self-pay | Admitting: Primary Care

## 2019-01-17 ENCOUNTER — Other Ambulatory Visit: Payer: Self-pay

## 2019-01-17 ENCOUNTER — Ambulatory Visit (INDEPENDENT_AMBULATORY_CARE_PROVIDER_SITE_OTHER): Payer: Self-pay | Admitting: Primary Care

## 2019-01-17 VITALS — BP 160/98 | HR 86 | Temp 98.4°F | Ht 70.0 in | Wt 251.0 lb

## 2019-01-17 DIAGNOSIS — I10 Essential (primary) hypertension: Secondary | ICD-10-CM

## 2019-01-17 DIAGNOSIS — K5909 Other constipation: Secondary | ICD-10-CM

## 2019-01-17 DIAGNOSIS — Z23 Encounter for immunization: Secondary | ICD-10-CM

## 2019-01-17 DIAGNOSIS — Z760 Encounter for issue of repeat prescription: Secondary | ICD-10-CM

## 2019-01-17 DIAGNOSIS — F17210 Nicotine dependence, cigarettes, uncomplicated: Secondary | ICD-10-CM

## 2019-01-17 DIAGNOSIS — Z720 Tobacco use: Secondary | ICD-10-CM

## 2019-01-17 MED ORDER — CLONIDINE HCL 0.2 MG PO TABS: 0.2000 mg | ORAL_TABLET | Freq: Two times a day (BID) | ORAL | 1 refills | Status: DC

## 2019-01-17 MED ORDER — HYDROCHLOROTHIAZIDE 25 MG PO TABS: 25.0000 mg | ORAL_TABLET | Freq: Every day | ORAL | 1 refills | Status: DC

## 2019-01-17 MED ORDER — LOSARTAN POTASSIUM 50 MG PO TABS: 50.0000 mg | ORAL_TABLET | Freq: Every day | ORAL | 1 refills | Status: DC

## 2019-01-17 MED ORDER — SENNA 8.6 MG PO TABS: 1.0000 | ORAL_TABLET | Freq: Every day | ORAL | 1 refills | Status: DC

## 2019-01-17 NOTE — Patient Instructions (Signed)

## 2019-01-20 NOTE — Progress Notes (Signed)
Established Patient Office Visit  Subjective:  Patient ID: Daniel [name], male    DOB: 09/13/1960  Age: 58 y.o. MRN: [id number]  CC:  Chief Complaint  Patient presents with  . Hypertension  . Medication Refill    HPI Daniel [name] presents for the management of chronic disease type 2 diabetes A1c 5.8 November 01, 2018 well-controlled, hypertension elevated with a blood pressure 160/98. He denies shortness of breath, headaches, chest pain or lower extremity edema.  Also requesting medication refills.  Past Medical History:  Diagnosis Date  . Diabetes mellitus without complication ([place])   . Hypertension   . Shoulder pain, left 06/28/2013    Past Surgical History:  Procedure Laterality Date  . APPENDECTOMY    . COLONOSCOPY  01/23/2012   Procedure: COLONOSCOPY;  Surgeon: Daniel Binder, MD;  [facility];  Service: Endoscopy;  Laterality: N/A;  11:10 AM  . INCISION AND DRAINAGE ABSCESS N/A 06/29/2016   Procedure: INCISION AND DRAINAGE ABDOMINAL WALL ABSCESS;  Surgeon: Daniel Overall, MD;  Location: [facility];  Service: General;  Laterality: N/A;  . Left heel surgery      Family History  Problem Relation Age of Onset  . Colon cancer Neg Hx     Social History   Socioeconomic History  . Marital status: Single    Spouse name: Not on file  . Number of children: Not on file  . Years of education: Not on file  . Highest education level: Not on file  Occupational History  . Not on file  Social Needs  . Financial resource strain: Not on file  . Food insecurity    Worry: Not on file    Inability: Not on file  . Transportation needs    Medical: Not on file    Non-medical: Not on file  Tobacco Use  . Smoking status: Current Every Day Smoker    Packs/day: 1.00    Years: 30.00    Pack years: 30.00    Types: Cigarettes  . Smokeless tobacco: Never Used  Substance and Sexual Activity  . Alcohol use: Yes    Alcohol/week: 12.0 standard drinks    Types: 12 Cans of  beer per week  . Drug use: Yes    Types: "Crack" cocaine  . Sexual activity: Yes    Birth control/protection: None  Lifestyle  . Physical activity    Days per week: Not on file    Minutes per session: Not on file  . Stress: Not on file  Relationships  . Social Herbalist on phone: Not on file    Gets together: Not on file    Attends religious service: Not on file    Active member of club or organization: Not on file    Attends meetings of clubs or organizations: Not on file    Relationship status: Not on file  . Intimate partner violence    Fear of current or ex partner: Not on file    Emotionally abused: Not on file    Physically abused: Not on file    Forced sexual activity: Not on file  Other Topics Concern  . Not on file  Social History Narrative  . Not on file    Outpatient Medications Prior to Visit  Medication Sig Dispense Refill  . amLODipine (NORVASC) 10 MG tablet Take 1 tablet (10 mg total) by mouth daily. 90 tablet 0  . blood glucose meter kit and supplies KIT Dispense based  on patient and insurance preference. Use up to four times daily as directed. (FOR ICD-9 250.00, 250.01). 1 each 0  . triamcinolone cream (KENALOG) 0.1 % Apply 1 application topically 2 (two) times daily. 30 g 0  . cloNIDine (CATAPRES) 0.2 MG tablet Take 1 tablet (0.2 mg total) by mouth 2 (two) times daily. 60 tablet 11  . hydrochlorothiazide (HYDRODIURIL) 25 MG tablet Take 1 tablet (25 mg total) by mouth daily. 90 tablet 3  . losartan (COZAAR) 50 MG tablet Take 1 tablet (50 mg total) by mouth daily. 30 tablet 3   No facility-administered medications prior to visit.     No Known Allergies  ROS Review of Systems    Objective:    Physical Exam  BP (!) 160/98 (BP Location: Left Arm, Patient Position: Sitting, Cuff Size: Large)   Pulse 86   Temp 98.4 F (36.9 C) (Oral)   Ht 5' 10"  (1.778 m)   Wt 251 lb (113.9 kg)   SpO2 98%   BMI 36.01 kg/m  Wt Readings from Last 3  Encounters:  01/17/19 251 lb (113.9 kg)  12/01/18 247 lb 9.6 oz (112.3 kg)  11/01/18 251 lb 9.6 oz (114.1 kg)     Health Maintenance Due  Topic Date Due  . FOOT EXAM  11/09/1969  . OPHTHALMOLOGY EXAM  11/09/1969    There are no preventive care reminders to display for this patient.  Lab Results  Component Value Date   TSH 2.833 06/29/2016   Lab Results  Component Value Date   WBC 5.7 10/21/2018   HGB 10.7 (L) 10/21/2018   HCT 32.6 (L) 10/21/2018   MCV 105.2 (H) 10/21/2018   PLT 115 (L) 10/21/2018   Lab Results  Component Value Date   NA 138 10/21/2018   K 4.0 10/21/2018   CO2 19 (L) 10/21/2018   GLUCOSE 106 (H) 10/21/2018   BUN 34 (H) 10/21/2018   CREATININE 2.54 (H) 10/21/2018   BILITOT 0.4 09/20/2018   ALKPHOS 43 09/20/2018   AST 57 (H) 09/20/2018   ALT 29 09/20/2018   PROT 7.8 09/20/2018   ALBUMIN 3.2 (L) 09/20/2018   CALCIUM 8.8 (L) 10/21/2018   ANIONGAP 10 10/21/2018   Lab Results  Component Value Date   CHOL 140 05/21/2017   Lab Results  Component Value Date   HDL 39 (L) 05/21/2017   Lab Results  Component Value Date   LDLCALC 50 05/21/2017   Lab Results  Component Value Date   TRIG 220 (H) 05/22/2017   Lab Results  Component Value Date   CHOLHDL 3.6 05/21/2017   Lab Results  Component Value Date   HGBA1C 5.8 (A) 11/01/2018      Assessment & Plan:  Daniel [name] was seen today for hypertension and medication refill.  Diagnoses and all orders for this visit:  Need for immunization against influenza CDC recommends influenza vaccine yearly.  Flu intercanthus respiratory illness caused by influenza virus that affects the nose throat and sometimes the lungs.  Experts believe that liver spread managed by tract was made mainly by tiny droplets made when people with the flu cough sneeze or talk. -     Flu Vaccine QUAD 6+ mos PF IM (Fluarix Quad PF)  Tobacco abuse Nicotine affect every organ in the body second leading cause of death.  Increased  risk for lung cancer and other respiratory diseases recommend cessation.  This will be reminded at each clinical visit.  Hypertension, unspecified type Counseled on blood pressure goal of  less than 130/80, low-sodium, DASH diet, medication compliance, 150 minutes of moderate intensity exercise per week. Discussed medication compliance, adverse effects.  Other constipation MANAGEMENT OF CHRONIC CONSTIPATION   Drink fluids in the recommended amount everyday. Recommend amount is 8 cups of water daily. Do not replace water with Gatorade or Powerade as these should only be used when you are dehydrated.   Eat lots of high fiber foods-fruits, veggies, bran and whole grain instead of white bread  Be active everyday. Inactivity makes constipation worse.  Add psyllium daily (Metamucil) which comes in capsules now. Start very low dose and work up to recommended dose on bottle daily.  Stay away from [place] or any magnesium containing laxative, unless you need it to clear things out rarely. It is an addictive laxative and your gut will become dependent on it.  If that is not working, I would start Miralax, which you can buy in generic 17 gms daily. It's a powder and not an "addictive laxative". Take it every day and titrate the dose up or down to get the daily Bm.  We will consider the use of other pharmacological treatments should the above recommendations prove to be unsuccessful.    Other orders/Medication Refills -     losartan (COZAAR) 50 MG tablet; Take 1 tablet (50 mg total) by mouth daily. -     cloNIDine (CATAPRES) 0.2 MG tablet; Take 1 tablet (0.2 mg total) by mouth 2 (two) times daily. -     hydrochlorothiazide (HYDRODIURIL) 25 MG tablet; Take 1 tablet (25 mg total) by mouth daily. -     senna (SENOKOT) 8.6 MG TABS tablet; Take 1 tablet (8.6 mg total) by mouth daily.     Meds ordered this encounter  Medications  . losartan (COZAAR) 50 MG tablet    Sig: Take 1 tablet (50  mg total) by mouth daily.    Dispense:  90 tablet    Refill:  1  . cloNIDine (CATAPRES) 0.2 MG tablet    Sig: Take 1 tablet (0.2 mg total) by mouth 2 (two) times daily.    Dispense:  180 tablet    Refill:  1  . hydrochlorothiazide (HYDRODIURIL) 25 MG tablet    Sig: Take 1 tablet (25 mg total) by mouth daily.    Dispense:  90 tablet    Refill:  1  . senna (SENOKOT) 8.6 MG TABS tablet    Sig: Take 1 tablet (8.6 mg total) by mouth daily.    Dispense:  120 tablet    Refill:  1    Follow-up: Return in about 2 weeks (around 01/31/2019) for BP ck.    [name], NP

## 2019-01-22 ENCOUNTER — Encounter (HOSPITAL_COMMUNITY): Payer: Self-pay

## 2019-01-22 ENCOUNTER — Other Ambulatory Visit: Payer: Self-pay

## 2019-01-22 ENCOUNTER — Inpatient Hospital Stay (HOSPITAL_COMMUNITY)
Admission: EM | Admit: 2019-01-22 | Discharge: 2019-01-24 | DRG: 312 | Disposition: A | Discharge status: Home / Self Care | Payer: Self-pay | Attending: Family Medicine | Admitting: Family Medicine

## 2019-01-22 ENCOUNTER — Emergency Department (HOSPITAL_COMMUNITY): Payer: Self-pay

## 2019-01-22 DIAGNOSIS — Z20828 Contact with and (suspected) exposure to other viral communicable diseases: Secondary | ICD-10-CM | POA: Diagnosis present

## 2019-01-22 DIAGNOSIS — E1142 Type 2 diabetes mellitus with diabetic polyneuropathy: Secondary | ICD-10-CM | POA: Diagnosis present

## 2019-01-22 DIAGNOSIS — R195 Other fecal abnormalities: Secondary | ICD-10-CM | POA: Insufficient documentation

## 2019-01-22 DIAGNOSIS — N183 Chronic kidney disease, stage 3 (moderate): Secondary | ICD-10-CM | POA: Diagnosis present

## 2019-01-22 DIAGNOSIS — D539 Nutritional anemia, unspecified: Secondary | ICD-10-CM | POA: Diagnosis present

## 2019-01-22 DIAGNOSIS — E876 Hypokalemia: Secondary | ICD-10-CM | POA: Diagnosis present

## 2019-01-22 DIAGNOSIS — K5791 Diverticulosis of intestine, part unspecified, without perforation or abscess with bleeding: Secondary | ICD-10-CM | POA: Diagnosis present

## 2019-01-22 DIAGNOSIS — B1920 Unspecified viral hepatitis C without hepatic coma: Secondary | ICD-10-CM | POA: Diagnosis present

## 2019-01-22 DIAGNOSIS — I951 Orthostatic hypotension: Principal | ICD-10-CM | POA: Diagnosis present

## 2019-01-22 DIAGNOSIS — I129 Hypertensive chronic kidney disease with stage 1 through stage 4 chronic kidney disease, or unspecified chronic kidney disease: Secondary | ICD-10-CM | POA: Diagnosis present

## 2019-01-22 DIAGNOSIS — E1122 Type 2 diabetes mellitus with diabetic chronic kidney disease: Secondary | ICD-10-CM | POA: Diagnosis present

## 2019-01-22 DIAGNOSIS — D649 Anemia, unspecified: Secondary | ICD-10-CM | POA: Insufficient documentation

## 2019-01-22 DIAGNOSIS — R768 Other specified abnormal immunological findings in serum: Secondary | ICD-10-CM

## 2019-01-22 DIAGNOSIS — N179 Acute kidney failure, unspecified: Secondary | ICD-10-CM | POA: Insufficient documentation

## 2019-01-22 DIAGNOSIS — Z79899 Other long term (current) drug therapy: Secondary | ICD-10-CM

## 2019-01-22 DIAGNOSIS — G8929 Other chronic pain: Secondary | ICD-10-CM | POA: Diagnosis present

## 2019-01-22 DIAGNOSIS — F17210 Nicotine dependence, cigarettes, uncomplicated: Secondary | ICD-10-CM | POA: Diagnosis present

## 2019-01-22 DIAGNOSIS — D61818 Other pancytopenia: Secondary | ICD-10-CM | POA: Diagnosis present

## 2019-01-22 DIAGNOSIS — F1010 Alcohol abuse, uncomplicated: Secondary | ICD-10-CM | POA: Diagnosis present

## 2019-01-22 DIAGNOSIS — Z9114 Patient's other noncompliance with medication regimen: Secondary | ICD-10-CM

## 2019-01-22 LAB — CBC
HCT: 29.8 % — ABNORMAL LOW (ref 39.0–52.0)
Hemoglobin: 9.9 g/dL — ABNORMAL LOW (ref 13.0–17.0)
MCH: 34.5 pg — ABNORMAL HIGH (ref 26.0–34.0)
MCHC: 33.2 g/dL (ref 30.0–36.0)
MCV: 103.8 fL — ABNORMAL HIGH (ref 80.0–100.0)
Platelets: 114 10*3/uL — ABNORMAL LOW (ref 150–400)
RBC: 2.87 MIL/uL — ABNORMAL LOW (ref 4.22–5.81)
RDW: 13 % (ref 11.5–15.5)
WBC: 5.3 10*3/uL (ref 4.0–10.5)
nRBC: 0 % (ref 0.0–0.2)

## 2019-01-22 LAB — COMPREHENSIVE METABOLIC PANEL
ALT: 45 U/L — ABNORMAL HIGH (ref 0–44)
AST: 83 U/L — ABNORMAL HIGH (ref 15–41)
Albumin: 3.1 g/dL — ABNORMAL LOW (ref 3.5–5.0)
Alkaline Phosphatase: 42 U/L (ref 38–126)
Anion gap: 12 (ref 5–15)
BUN: 47 mg/dL — ABNORMAL HIGH (ref 6–20)
CO2: 20 mmol/L — ABNORMAL LOW (ref 22–32)
Calcium: 9.9 mg/dL (ref 8.9–10.3)
Chloride: 104 mmol/L (ref 98–111)
Creatinine, Ser: 4.08 mg/dL — ABNORMAL HIGH (ref 0.61–1.24)
GFR calc Af Amer: 17 mL/min — ABNORMAL LOW (ref >60)
GFR calc non Af Amer: 15 mL/min — ABNORMAL LOW (ref >60)
Glucose, Bld: 158 mg/dL — ABNORMAL HIGH (ref 70–99)
Potassium: 3.2 mmol/L — ABNORMAL LOW (ref 3.5–5.1)
Sodium: 136 mmol/L (ref 135–145)
Total Bilirubin: 0.3 mg/dL (ref 0.3–1.2)
Total Protein: 7.8 g/dL (ref 6.5–8.1)

## 2019-01-22 LAB — DIFFERENTIAL
Abs Immature Granulocytes: 0.01 10*3/uL (ref 0.00–0.07)
Basophils Absolute: 0 10*3/uL (ref 0.0–0.1)
Basophils Relative: 0 %
Eosinophils Absolute: 0.1 10*3/uL (ref 0.0–0.5)
Eosinophils Relative: 2 %
Immature Granulocytes: 0 %
Lymphocytes Relative: 47 %
Lymphs Abs: 2.5 10*3/uL (ref 0.7–4.0)
Monocytes Absolute: 0.4 10*3/uL (ref 0.1–1.0)
Monocytes Relative: 7 %
Neutro Abs: 2.3 10*3/uL (ref 1.7–7.7)
Neutrophils Relative %: 44 %

## 2019-01-22 LAB — MAGNESIUM: Magnesium: 2.1 mg/dL (ref 1.7–2.4)

## 2019-01-22 LAB — APTT: aPTT: 30 seconds (ref 24–36)

## 2019-01-22 LAB — PROTIME-INR
INR: 1 (ref 0.8–1.2)
Prothrombin Time: 13.2 seconds (ref 11.4–15.2)

## 2019-01-22 LAB — CBG MONITORING, ED: Glucose-Capillary: 126 mg/dL — ABNORMAL HIGH (ref 70–99)

## 2019-01-22 LAB — POC OCCULT BLOOD, ED: Fecal Occult Bld: POSITIVE — AB

## 2019-01-22 LAB — PHOSPHORUS: Phosphorus: 4.6 mg/dL (ref 2.5–4.6)

## 2019-01-22 LAB — SARS CORONAVIRUS 2 BY RT PCR (HOSPITAL ORDER, PERFORMED IN ~~LOC~~ HOSPITAL LAB): SARS Coronavirus 2: NEGATIVE

## 2019-01-22 MED ORDER — SODIUM CHLORIDE 0.9 % IV BOLUS: 1000.0000 mL | Freq: Once | INTRAVENOUS | Status: DC

## 2019-01-22 MED ORDER — POTASSIUM CHLORIDE CRYS ER 20 MEQ PO TBCR
40.0000 meq | EXTENDED_RELEASE_TABLET | Freq: Once | ORAL | Status: AC
  Administered 2019-01-22: 40 meq via ORAL
  Filled 2019-01-22: qty 2

## 2019-01-22 MED ORDER — SODIUM CHLORIDE 0.9% FLUSH
3.0000 mL | Freq: Once | INTRAVENOUS | Status: AC
  Administered 2019-01-22: 16:00:00 3 mL via INTRAVENOUS

## 2019-01-22 MED ORDER — SODIUM CHLORIDE 0.9 % IV BOLUS
1000.0000 mL | Freq: Once | INTRAVENOUS | Status: AC
  Administered 2019-01-22: 1000 mL via INTRAVENOUS

## 2019-01-22 MED ORDER — VITAMIN B-1 100 MG PO TABS
100.0000 mg | ORAL_TABLET | Freq: Every day | ORAL | Status: DC
  Administered 2019-01-22 – 2019-01-24 (×3): 100 mg via ORAL
  Filled 2019-01-22 (×3): qty 1

## 2019-01-22 MED ORDER — SODIUM CHLORIDE 0.9 % IV SOLN: INTRAVENOUS | Status: DC

## 2019-01-22 MED ORDER — SODIUM CHLORIDE 0.9 % IV SOLN
INTRAVENOUS | Status: AC
  Administered 2019-01-22 – 2019-01-23 (×2): via INTRAVENOUS

## 2019-01-22 MED ORDER — ADULT MULTIVITAMIN W/MINERALS CH
1.0000 | ORAL_TABLET | Freq: Every day | ORAL | Status: DC
  Administered 2019-01-22 – 2019-01-24 (×3): 1 via ORAL
  Filled 2019-01-22 (×3): qty 1

## 2019-01-22 MED ORDER — FOLIC ACID 1 MG PO TABS
1.0000 mg | ORAL_TABLET | Freq: Every day | ORAL | Status: DC
  Administered 2019-01-22 – 2019-01-24 (×3): 1 mg via ORAL
  Filled 2019-01-22 (×3): qty 1

## 2019-01-22 MED ORDER — ENOXAPARIN SODIUM 30 MG/0.3ML ~~LOC~~ SOLN: 30.0000 mg | SUBCUTANEOUS | Status: DC

## 2019-01-22 MED ORDER — SODIUM CHLORIDE 0.9 % IV BOLUS
500.0000 mL | Freq: Once | INTRAVENOUS | Status: AC
  Administered 2019-01-22: 500 mL via INTRAVENOUS

## 2019-01-22 MED ORDER — CLONIDINE HCL 0.1 MG PO TABS
0.1000 mg | ORAL_TABLET | Freq: Two times a day (BID) | ORAL | Status: DC
  Administered 2019-01-22 – 2019-01-24 (×4): 0.1 mg via ORAL
  Filled 2019-01-22 (×5): qty 1

## 2019-01-22 NOTE — H&P (Addendum)
[facility] Admission History and Physical Service Pager: [phone]  Patient name: Daniel [name] Medical record number: [id number] Date of birth: 1960/05/31 Age: 58 y.o. Gender: male  Primary Care Provider: [name], NP Consultants: Code Status:   Code Status: Full Code Preferred Emergency Contact: [name]  Chief Complaint: weakness, dizziness    Assessment and Plan: Daniel [name] is a 58 y.o. male presenting after presyncopal episode . PMH is significant for T2DM, HTN, CKD, Chronic foot and shoulder pain, Hep C, EtOH abuse, substance abuse, intracerebral hemorrhage   Hypotension  Symptomatic Anemia Patient reports acute onset of lightheadedness that began yesterday at home.  Dizziness is worse with movement and is not described as room spinning.  States he has not had these symptoms before.  Upon arrival to the ED patient's blood pressure 105/66.  Upon standing patient's systolic blood pressure dropped to 84. Patient was given 500 mL bolus with improvement of blood pressure.  Patient states his pressure medication was recently changed.  However, he is unable to tell me what time of day he takes his medicines and which medication is for his blood pressure.  He has been eating and drinking well, poor intake is less likely.  Patient appears to have poor health literacy, so medication nonadherence and confusion is likely, and patient endorses sometimes forgetting his medications.  Polypharmacy the most likely cause of his orthostatic hypotension since losartan was recently added back to his regimen, and he likely does not need 4 agents for BP control due to his nonadherence.  Patient had a history of left thalamic intercerebral hemorrhage in 04/2017 without any residual deficits, so this is likely not contributing.  CT head obtained today was negative for acute processes.  Not likely, cerebellar stroke as neurological exam was normal.  Last ECHO  on 05/23/2017 showed EF of 55 to 60%, concentric LVH and grade 2 diastolic dysfunction.  Cardiac origin of hypotension less likely but possible.  ECHO ordered.  Patient has hemoglobin of 9.9 and has been steadily declining since last year when Hgb was 13, likely due to alcohol use given MCV 103.8 and slow GI bleed given positive fecal occult test in the ED.   - Admit to medical telemetry, attending Dr. Ardelia Mems - 1 L bolus normal saline followed by 125 ml/hr mIVF, s/p 500 ml bolus in ED - hold losartan, HCTZ, amlodipine - Vitals per routine - CBC / BMP - F/U ECHO   Acute kidney injury  CKD Creatinine on admission was 4.08  Cr 2.54 on 10/21/18, possibly patient's baseline.  Will hold nephrotoxic drugs. Potential etiology includes intrinsic injury due to ATN from hypotension as well as pre-renal due to dehydration. - Hold home Losartan, HCTZ - Avoid nephrotoxic agents - daily BMPs  - Maintenance IV fluids normal saline - F/u on FeUrea   Melena Patient reports one episode of melena three days ago.  Possibly the cause of patient's symptomatic anemia as his hemoglobin has slowly downtrended from 13  in 2019 to 9.9 on admission.  Unlikely acute GI bleed due to normal appearance of stool on DRE and slow downtrend of Hgb.  Patient had a colonoscopy in 2013 that showed only diverticulosis with a recommended repeat follow up in 10 years.  He denies Fe intake and says he will notice melanotic stools every 4-5 months.   -Consider GI consult -Trend H/H  Hypertension Hypotensive on admission. Blood pressures since admission have ranged between 105/66-124/72.  Home meds include  Losartan 50 mg daily, HCTZ 25 mg daily, Clonidine 0.2 mg twice daily.  Amlodipine 10 mg is prescribed however patient did not have this medication in his bag today unsure if this medication was discontinued at his last PCP visit.  Will continue clonidine at half dose to prevent rebound hypotension. - Hold Amlodipine, HCTZ, Losartan   - Clonidine 0.1 BID - Monitor blood pressures - consider weaning down clonidine or switching to clonidine patch given patient's nonadherence and risk of rebound hypertension  Diabetes mellitus CBGs 153, 126. Patient denies being on medication for diabetes.  Last A1c was 5.6 on 11/01/18.  He does not check his blood sugar. - BMP  - no CBG checks d/t good control  Tobacco use disorder Patient endorses smoking 1 pack/day for the past 30+ years.  Patient did not request nicotine patch while in the ED. -Encourage smoking cessation -Nicotine patch if needed  Alcohol use disorder Reports drinking two quarts of malt liquor daily.  His last drink was yesterday, 9/25.  He denies prior difficulties with withdrawal.  He denies other drug use. AST 83 and ALT 45.  - CIWA protocol  -Thiamine, folic acid, multivitamin  ?Hepatitis C Hepatitis C antibody was >11 in 06/08/17.  He denies any diagnosis of hepatitis.  It appears he was asked to follow up at the time, but no further notes refer to this. AST 83 and ALT 45.  - outpatient ID follow up for Hep C at discharge   Chronic foot pain He is unsure why he has been prescribed gabapentin but takes 300 mg once daily.  Describes numbness and tingling of his bilateral feet, likely due to neuropathy. Pain is worse at night.  - Hold home medication  -Consider ABI outpatient    FEN/GI: Regular diet, replete electrolytes as needed  Prophylaxis: SCDs    Disposition: Likely home, pending medical stabilization   History of Present Illness:  Daniel [name] is a 58 y.o. male presenting with presyncope.  This started yesterday, 9/25 and would occur when he stood or moved too fast.  He denies LOC.  He denies chest pain or abdominal pain.  He endorses chronic R shoulder pain and bilateral foot pain but no acute pain.  He did not have an appetite today says he was eating about three meals per day this week and has a normal amount of fluid intake.  He reports  adherence to his medication regimen but can't remember which medications were changed at his recent appointment.  He says his blood pressure usually is 784 systolic.    Review Of Systems: Per HPI with the following additions:   Review of Systems  Constitutional: Negative for chills and diaphoresis.  HENT: Negative for congestion and sore throat.   Eyes: Negative for blurred vision.  Respiratory: Positive for cough and shortness of breath.   Cardiovascular: Negative for chest pain, palpitations, claudication and leg swelling.  Gastrointestinal: Positive for melena. Negative for abdominal pain, nausea and vomiting.  Genitourinary: Negative for dysuria.  Musculoskeletal: Negative for back pain, falls and joint pain.  Neurological: Positive for dizziness.  Psychiatric/Behavioral: Positive for substance abuse.    Patient Active Problem List   Diagnosis Date Noted  . Orthostatic hypotension 01/22/2019  . AKI (acute kidney injury) ([place])   . Anemia   . Heme positive stool   . ICH (intracerebral hemorrhage) ([place]) 05/20/2017  . Cellulitis of abdominal wall 06/29/2016  . Abscess of abdominal wall 06/29/2016  . Hypertension 06/29/2016  . DM2 (  diabetes mellitus, type 2) ([place]) 06/29/2016  . Sepsis ([place]) 06/29/2016    Past Medical History: Past Medical History:  Diagnosis Date  . Diabetes mellitus without complication ([name])   . Hypertension   . Shoulder pain, left 06/28/2013    Past Surgical History: Past Surgical History:  Procedure Laterality Date  . APPENDECTOMY    . COLONOSCOPY  01/23/2012   Procedure: COLONOSCOPY;  Surgeon: Danie Binder, MD;  Location: [facility];  Service: Endoscopy;  Laterality: N/A;  11:10 AM  . INCISION AND DRAINAGE ABSCESS N/A 06/29/2016   Procedure: INCISION AND DRAINAGE ABDOMINAL WALL ABSCESS;  Surgeon: Alphonsa Overall, MD;  Location: [facility] ORS;  Service: General;  Laterality: N/A;  . Left heel surgery      Social History: Social History   Tobacco Use  .  Smoking status: Current Every Day Smoker    Packs/day: 1.00    Years: 30.00    Pack years: 30.00    Types: Cigarettes  . Smokeless tobacco: Never Used  Substance Use Topics  . Alcohol use: Yes    Alcohol/week: 12.0 standard drinks    Types: 12 Cans of beer per week  . Drug use: Yes    Types: "Crack" cocaine   Additional social history:  Please also refer to relevant sections of EMR.  Family History: Family History  Problem Relation Age of Onset  . Colon cancer Neg Hx   No known Family hx of heart disease   Allergies and Medications: No Known Allergies No current facility-administered medications on file prior to encounter.    Current Outpatient Medications on File Prior to Encounter  Medication Sig Dispense Refill  . amLODipine (NORVASC) 10 MG tablet Take 1 tablet (10 mg total) by mouth daily. 90 tablet 0  . blood glucose meter kit and supplies KIT Dispense based on patient and insurance preference. Use up to four times daily as directed. (FOR ICD-9 250.00, 250.01). 1 each 0  . cloNIDine (CATAPRES) 0.2 MG tablet Take 1 tablet (0.2 mg total) by mouth 2 (two) times daily. 180 tablet 1  . hydrochlorothiazide (HYDRODIURIL) 25 MG tablet Take 1 tablet (25 mg total) by mouth daily. 90 tablet 1  . losartan (COZAAR) 50 MG tablet Take 1 tablet (50 mg total) by mouth daily. 90 tablet 1  . senna (SENOKOT) 8.6 MG TABS tablet Take 1 tablet (8.6 mg total) by mouth daily. 120 tablet 1  . triamcinolone cream (KENALOG) 0.1 % Apply 1 application topically 2 (two) times daily. 30 g 0    Objective: BP 120/70   Pulse (!) 57   Temp 98.2 F (36.8 C) (Oral)   Resp 17   Ht 5' 10"  (1.778 m)   Wt 113.9 kg   SpO2 100%   BMI 36.01 kg/m   Exam: GEN:    Alert, in no acute distress, not ill-appearing  HENT:  mucus membranes moist, oropharyngeal without lesions or erythema,  nares patent, no nasal discharge, poor dentition  EYES:   pupils equal and reactive, EOM intact NECK:  supple, normal ROM,  no lymphadenopathy  RESP:  clear to auscultation bilaterally, no increased work of breathing  CVS:   regular rate and rhythm, no murmur,  ABD:  soft, non-tender; bowel sounds present; no palpable masses, EXT:   limited ROM in right upper extremity (baseline), non tender, no lower extremity edema or calf tenderness NEURO:  CN 2-12 grossly intact, speech normal, alert and oriented x4, gross sensation intact, strength 5/5 bilateral upper and lower  extremities, no nystagmus Skin:   warm and dry, normal skin turgor Psych: Normal affect and thought content     Labs and Imaging: CBC BMET  Recent Labs  Lab 01/22/19 1441  WBC 5.3  HGB 9.9*  HCT 29.8*  PLT 114*   Recent Labs  Lab 01/22/19 1441  NA 136  K 3.2*  CL 104  CO2 20*  BUN 47*  CREATININE 4.08*  GLUCOSE 158*  CALCIUM 9.9     EKG: NSR HR 81, LVH   Ct Head Wo Contrast  Result Date: 01/22/2019 CLINICAL DATA:  Headache. EXAM: CT HEAD WITHOUT CONTRAST TECHNIQUE: Contiguous axial images were obtained from the base of the skull through the vertex without intravenous contrast. COMPARISON:  CT scan of May 27, 2017. FINDINGS: Brain: No evidence of acute infarction, hemorrhage, hydrocephalus, extra-axial collection or mass lesion/mass effect. Vascular: No hyperdense vessel or unexpected calcification. Skull: Normal. Negative for fracture or focal lesion. Sinuses/Orbits: No acute finding. Other: None. IMPRESSION: Normal head CT. Electronically Signed   By: Marijo Conception M.D.   On: 01/22/2019 15:34     Lyndee Hensen, MD 01/22/2019, 7:46 PM PGY-1, [place] Intern pager: [phone], text pages welcome  FPTS Upper-Level Resident Addendum   I have independently interviewed and examined the patient. I have discussed the above with the original author and agree with their documentation. My edits for correction/addition/clarification are in blue. Please see also any attending notes.    Kathrene Alu,  MD PGY-3, [facility] 01/22/2019 8:21 PM  [place] Service pager: [phone] (text pages welcome through [place])

## 2019-01-22 NOTE — Progress Notes (Signed)
New Admission Note:  Arrival Method: By bed from ED at this time Mental Orientation: Alert and oriented Telemetry: Box 13, CCMD notified Assessment: Completed Skin: Completed, refer to flowsheets IV: Left A.C. S.L. Pain: Denies Tubes: None Safety Measures: Safety Fall Prevention Plan was given, discussed  Admission: Completed [facility] Orientation: Patient has been orientated to the room, unit and the staff. Family: None  Orders have been reviewed and implemented. Will continue to monitor the patient. Call light has been placed within reach and bed alarm has been activated.   Perry Mount, RN  Phone Number: [phone]

## 2019-01-22 NOTE — ED Provider Notes (Signed)
[facility] EMERGENCY DEPARTMENT Provider Note   CSN: [id number] Arrival date & time: 01/22/19  1409     History   Chief Complaint Chief Complaint  Patient presents with   Weakness   Dizziness    HPI Daniel [name] is a 58 y.o. male.     58 year old male with history of hypertension, diabetes, intracerebral hemorrhage presents with complaint of feeling lightheaded with standing since 6 PM last night.  Patient states symptoms completely resolved while he is resting, does not feel dizzy or lightheaded turning his head side to side.  Denies fevers, chills, shortness of breath, chest pain, abdominal pain, changes in bowel or bladder habits.  Patient reports occasional black stools, last episode was 3 days ago.  Last colonoscopy was several years ago, no results.  Denies acute changes in vision, no changes in speech or gait, denies arm weakness or numbness.  Patient was seen by his PCP 5 days ago, started on losartan and HCTZ for his blood pressure.  No other complaints or concerns.     Past Medical History:  Diagnosis Date   Diabetes mellitus without complication ([place])    Hypertension    Shoulder pain, left 06/28/2013    Patient Active Problem List   Diagnosis Date Noted   ICH (intracerebral hemorrhage) ([place]) 05/20/2017   Cellulitis of abdominal wall 06/29/2016   Abscess of abdominal wall 06/29/2016   Hypertension 06/29/2016   DM2 (diabetes mellitus, type 2) ([place]) 06/29/2016   Sepsis ([place]) 06/29/2016    Past Surgical History:  Procedure Laterality Date   APPENDECTOMY     COLONOSCOPY  01/23/2012   Procedure: COLONOSCOPY;  Surgeon: Danie Binder, MD;  [facility];  Service: Endoscopy;  Laterality: N/A;  11:10 AM   INCISION AND DRAINAGE ABSCESS N/A 06/29/2016   Procedure: INCISION AND DRAINAGE ABDOMINAL WALL ABSCESS;  Surgeon: Alphonsa Overall, MD;  Location: [facility]S;  Service: General;  Laterality: N/A;   Left heel surgery           Home Medications    Prior to Admission medications   Medication Sig Start Date End Date Taking? Authorizing Provider  amLODipine (NORVASC) 10 MG tablet Take 1 tablet (10 mg total) by mouth daily. 01/14/19   [name], NP  blood glucose meter kit and supplies KIT Dispense based on patient and insurance preference. Use up to four times daily as directed. (FOR ICD-9 250.00, 250.01). 07/01/16   Nita Sells, MD  cloNIDine (CATAPRES) 0.2 MG tablet Take 1 tablet (0.2 mg total) by mouth 2 (two) times daily. 01/17/19   [name], NP  hydrochlorothiazide (HYDRODIURIL) 25 MG tablet Take 1 tablet (25 mg total) by mouth daily. 01/17/19   [name], NP  losartan (COZAAR) 50 MG tablet Take 1 tablet (50 mg total) by mouth daily. 01/17/19   [name], NP  senna (SENOKOT) 8.6 MG TABS tablet Take 1 tablet (8.6 mg total) by mouth daily. 01/17/19   [name], NP  triamcinolone cream (KENALOG) 0.1 % Apply 1 application topically 2 (two) times daily. 11/01/18   [name], NP    Family History Family History  Problem Relation Age of Onset   Colon cancer Neg Hx     Social History Social History   Tobacco Use   Smoking status: Current Every Day Smoker    Packs/day: 1.00    Years: 30.00    Pack years: 30.00    Types: Cigarettes  Smokeless tobacco: Never Used  Substance Use Topics   Alcohol use: Yes    Alcohol/week: 12.0 standard drinks    Types: 12 Cans of beer per week   Drug use: Yes    Types: "Crack" cocaine     Allergies   Patient has no known allergies.   Review of Systems Review of Systems  Constitutional: Negative for fever.  Respiratory: Negative for chest tightness and shortness of breath.   Cardiovascular: Negative for chest pain and leg swelling.  Gastrointestinal: Negative for abdominal pain, constipation, diarrhea, nausea and vomiting.  Genitourinary: Negative for decreased urine volume and difficulty  urinating.  Musculoskeletal: Negative for arthralgias and myalgias.  Skin: Negative for rash and wound.  Allergic/Immunologic: Positive for immunocompromised state.  Neurological: Positive for light-headedness. Negative for weakness.  Hematological: Does not bruise/bleed easily.  Psychiatric/Behavioral: Negative for confusion.  All other systems reviewed and are negative.    Physical Exam Updated Vital Signs BP 112/72    Pulse 64    Temp 98.2 F (36.8 C) (Oral)    Resp (!) 26    Ht 5' 10"  (1.778 m)    Wt 113.9 kg    SpO2 99%    BMI 36.01 kg/m   Physical Exam Vitals signs and nursing note reviewed. Exam conducted with a chaperone present.  Constitutional:      General: He is not in acute distress.    Appearance: He is well-developed. He is not diaphoretic.  HENT:     Head: Normocephalic and atraumatic.  Eyes:     Extraocular Movements: Extraocular movements intact.     Pupils: Pupils are equal, round, and reactive to light.  Cardiovascular:     Rate and Rhythm: Normal rate and regular rhythm.     Pulses: Normal pulses.     Heart sounds: Normal heart sounds.  Pulmonary:     Effort: Pulmonary effort is normal.     Breath sounds: Normal breath sounds.  Abdominal:     Palpations: Abdomen is soft.     Tenderness: There is no abdominal tenderness.  Genitourinary:    Rectum: Guaiac result positive.     Comments: Soft brown stool present. Skin:    General: Skin is warm and dry.     Findings: No erythema or rash.  Neurological:     Mental Status: He is alert and oriented to person, place, and time.     Cranial Nerves: No cranial nerve deficit.     Sensory: No sensory deficit.     Motor: No weakness.     Coordination: Coordination normal.     Deep Tendon Reflexes: Reflexes normal.  Psychiatric:        Behavior: Behavior normal.      ED Treatments / Results  Labs (all labs ordered are listed, but only abnormal results are displayed) Labs Reviewed  CBC - Abnormal;  Notable for the following components:      Result Value   RBC 2.87 (*)    Hemoglobin 9.9 (*)    HCT 29.8 (*)    MCV 103.8 (*)    MCH 34.5 (*)    Platelets 114 (*)    All other components within normal limits  COMPREHENSIVE METABOLIC PANEL - Abnormal; Notable for the following components:   Potassium 3.2 (*)    CO2 20 (*)    Glucose, Bld 158 (*)    BUN 47 (*)    Creatinine, Ser 4.08 (*)    Albumin 3.1 (*)    AST  83 (*)    ALT 45 (*)    GFR calc non Af Amer 15 (*)    GFR calc Af Amer 17 (*)    All other components within normal limits  CBG MONITORING, ED - Abnormal; Notable for the following components:   Glucose-Capillary 126 (*)    All other components within normal limits  POC OCCULT BLOOD, ED - Abnormal; Notable for the following components:   Fecal Occult Bld POSITIVE (*)    All other components within normal limits  SARS CORONAVIRUS 2 (HOSPITAL ORDER, [facility])  PROTIME-INR  APTT  DIFFERENTIAL  I-STAT CHEM 8, ED  CBG MONITORING, ED    EKG EKG Interpretation  Date/Time:  Saturday January 22 2019 14:34:52 EDT Ventricular Rate:  81 PR Interval:  156 QRS Duration: 100 QT Interval:  394 QTC Calculation: 457 R Axis:   -6 Text Interpretation:  Normal sinus rhythm Left ventricular hypertrophy No significant change since last tracing Confirmed by Blanchie Dessert [id number]) on 01/22/2019 3:57:03 PM   Radiology Ct Head Wo Contrast  Result Date: 01/22/2019 CLINICAL DATA:  Headache. EXAM: CT HEAD WITHOUT CONTRAST TECHNIQUE: Contiguous axial images were obtained from the base of the skull through the vertex without intravenous contrast. COMPARISON:  CT scan of May 27, 2017. FINDINGS: Brain: No evidence of acute infarction, hemorrhage, hydrocephalus, extra-axial collection or mass lesion/mass effect. Vascular: No hyperdense vessel or unexpected calcification. Skull: Normal. Negative for fracture or focal lesion. Sinuses/Orbits: No acute  finding. Other: None. IMPRESSION: Normal head CT. Electronically Signed   By: Marijo Conception M.D.   On: 01/22/2019 15:34    Procedures .Critical Care Performed by: Tacy Learn, PA-C Authorized by: Tacy Learn, PA-C   Critical care provider statement:    Critical care time (minutes):  45   Critical care was time spent personally by me on the following activities:  Discussions with consultants, evaluation of patient's response to treatment, examination of patient, ordering and performing treatments and interventions, ordering and review of laboratory studies, ordering and review of radiographic studies, pulse oximetry, re-evaluation of patient's condition, obtaining history from patient or surrogate and review of old charts   (including critical care time)  Medications Ordered in ED Medications  sodium chloride flush (NS) 0.9 % injection 3 mL (3 mLs Intravenous Given 01/22/19 1615)  sodium chloride 0.9 % bolus 500 mL (500 mLs Intravenous New Bag/Given 01/22/19 1639)     Initial Impression / Assessment and Plan / ED Course  I have reviewed the triage vital signs and the nursing notes.  Pertinent labs & imaging results that were available during my care of the patient were reviewed by me and considered in my medical decision making (see chart for details).  Clinical Course as of Jan 21 1749  Sat Jan 21, 3057  [id number] 58 year old male presents with complaint of feeling lightheaded like he is going to pass out when he stands up.  Symptoms started last night around 6 PM.  Patient saw his PCP 5 days ago who added losartan and HCTZ for his blood pressure (per patient, review of notes shows these medications were prescribed on previous 10/2018 visit as well).  Patient reports occasionally having black stools, last episode was 3 days ago, does not take NSAIDs, not on blood thinners.  He denies chest pain, shortness of breath, weakness, fevers or any other complaints or concerns. Review of lab  work, CBC with chronic appearing anemia with hemoglobin of 9.9, gradual  trend down over the past year, 10.7 on 10/21/2018, 04/06/18 labs show hemoglobin of 13.9. CMP with AKI with creatinine of 4.08 (2.5 on 10/21/18).    [LM]  3338 On exam, patient is well-appearing, neuro exam is unremarkable, he is feeling well, does not feel lightheaded at this time.  Blood pressure is 105/66, patient is not tachycardic. Orthostatic vitals were ordered and with standing patient's blood pressure dropped to 84 systolic and he feels lightheaded.   [LM]  1743 Suspect patient lightheadedness and hypotension are due to either medication change or taking his blood pressure medications which she had not been previously doing.  Felt less likely to be due to anemia as he has not had a significant change in his hemoglobin since his June 2020 labs and his stool is not grossly heme positive. Patient was seen by Dr. Maryan Rued, ER attending, agrees with plan to consult hospitalist for admission.  Patient is agreeable with plan of care.  Patient given IV fluids for his AKI.   [LM]  1748 Case discussed with hospitalist who will consult for admission.    [LM]    Clinical Course User Index [LM] Tacy Learn, PA-C     Final Clinical Impressions(s) / ED Diagnoses   Final diagnoses:  AKI (acute kidney injury) ([place])  Orthostatic hypotension  Anemia, unspecified type  Heme positive stool    ED Discharge Orders    None       Tacy Learn, PA-C 01/22/19 1751    Blanchie Dessert, MD 01/23/19 1900

## 2019-01-22 NOTE — ED Notes (Signed)
Orthostatic vital signs attempted. Patient was unable to complete the standing portion without sitting down. Patient presents with hypotension. Provider is being notified.

## 2019-01-22 NOTE — ED Triage Notes (Signed)
Patient complains of dizziness and weakness since yesterday am, states that he feels like he isnt walking correctly, unsteady gait on arrival. Alert and oriented/

## 2019-01-23 ENCOUNTER — Observation Stay (HOSPITAL_BASED_OUTPATIENT_CLINIC_OR_DEPARTMENT_OTHER): Payer: Self-pay

## 2019-01-23 DIAGNOSIS — I951 Orthostatic hypotension: Secondary | ICD-10-CM

## 2019-01-23 LAB — CBC
HCT: 27.3 % — ABNORMAL LOW (ref 39.0–52.0)
Hemoglobin: 9.1 g/dL — ABNORMAL LOW (ref 13.0–17.0)
MCH: 35 pg — ABNORMAL HIGH (ref 26.0–34.0)
MCHC: 33.3 g/dL (ref 30.0–36.0)
MCV: 105 fL — ABNORMAL HIGH (ref 80.0–100.0)
Platelets: 96 10*3/uL — ABNORMAL LOW (ref 150–400)
RBC: 2.6 MIL/uL — ABNORMAL LOW (ref 4.22–5.81)
RDW: 13.3 % (ref 11.5–15.5)
WBC: 4.7 10*3/uL (ref 4.0–10.5)
nRBC: 0 % (ref 0.0–0.2)

## 2019-01-23 LAB — MAGNESIUM: Magnesium: 2 mg/dL (ref 1.7–2.4)

## 2019-01-23 LAB — BASIC METABOLIC PANEL
Anion gap: 9 (ref 5–15)
BUN: 43 mg/dL — ABNORMAL HIGH (ref 6–20)
CO2: 17 mmol/L — ABNORMAL LOW (ref 22–32)
Calcium: 9 mg/dL (ref 8.9–10.3)
Chloride: 113 mmol/L — ABNORMAL HIGH (ref 98–111)
Creatinine, Ser: 3.25 mg/dL — ABNORMAL HIGH (ref 0.61–1.24)
GFR calc Af Amer: 23 mL/min — ABNORMAL LOW (ref >60)
GFR calc non Af Amer: 20 mL/min — ABNORMAL LOW (ref >60)
Glucose, Bld: 124 mg/dL — ABNORMAL HIGH (ref 70–99)
Potassium: 3.3 mmol/L — ABNORMAL LOW (ref 3.5–5.1)
Sodium: 139 mmol/L (ref 135–145)

## 2019-01-23 LAB — ECHOCARDIOGRAM COMPLETE
Height: 71 in
Weight: 3988.81 oz

## 2019-01-23 LAB — VITAMIN B12: Vitamin B-12: 320 pg/mL (ref 180–914)

## 2019-01-23 MED ORDER — POTASSIUM CHLORIDE CRYS ER 20 MEQ PO TBCR
40.0000 meq | EXTENDED_RELEASE_TABLET | ORAL | Status: AC
  Administered 2019-01-23 (×3): 40 meq via ORAL
  Filled 2019-01-23 (×3): qty 2

## 2019-01-23 MED ORDER — SODIUM CHLORIDE 0.9 % IV SOLN
INTRAVENOUS | Status: AC
  Administered 2019-01-23: 20:00:00 via INTRAVENOUS

## 2019-01-23 MED ORDER — SODIUM CHLORIDE 0.9 % IV SOLN
INTRAVENOUS | Status: AC
  Administered 2019-01-23: 12:00:00 via INTRAVENOUS

## 2019-01-23 NOTE — Progress Notes (Signed)
Family Medicine Teaching Service Daily Progress Note Intern Pager: [phone]  Patient name: Daniel [name] Medical record number: [id number] Date of birth: 1960-12-29 Age: 58 y.o. Gender: male  Primary Care Provider: [name], NP Consultants: None Code Status: Full  Pt Overview and Major Events to Date:  9/26 - Admitted to FPTS  Assessment and Plan: Daniel [name] is a 58 y.o. male presenting after presyncopal episode . 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  Orthostatic Hypotension and Symptomatic Anemia  Chronic Hypertension: Symptoms multifactorial 2/2 to polypharmacy for HTN with poor health literacy likely contributing to medication adherence and symptomatic anemia. Home blood pressure meds include: Amlodipine 10mg  QD, Clonidine 0.2mg  BID, HCTZ 25mg  QD, and Losartan 50mg  QD. Orthostatics positive on admission. Blood pressures soft on admission, however improved this morning (148/79 this AM). P in the 50-60's. Hgb stable 9.9>9.1 (BL 13) this AM with MCV of 105. FOBT positive on admission - likely anemia of liver disease and slow GI bleed given history of diverticulosis on past cholonoscopy. However given new peripheral neuropathy and pancytopenia, will evaluate B12/folate levels. No known history of heart failure, however repeat echo pending. - s/p 1.5L NS bolus + MIVF overnight - continue home Clonidine 0.1mg  to prevent rebound HTN and tachycardia - continue to hold losartan, HCTZ, amlodipine - vitals per routine - AM CBC/BMP - f/u echo - consider weaning clonidine vs switching to clonidine patch given patient's non-adherence and risk of rebound HTN - f/u PT/OT recs - continuous pulse ox - folate/B12 level  Hypokalemia: K 3.2 s/p 53mEq Kdur x 1. K 3.3 this AM. Mag 2.1 on admission. - 63mEq Kdur x 3  - f/u repeat Mag level - daily  BMP  Acute on CKD: improving Cr 4.08>3.25 (BL 1.0-2.5). Expect prerenal 2/2 to hypotension given improvement with blood pressure. FeUrea pending. - hold nephrotoxic agents  - daily BMPs - hold home HCTZ and losartan - follow up FeUrea - consider continuing mIVF pending echo results  Melena/FOBT (+)  Pancytopenia: Episode of melena 4 days ago. Hemoglobin down-trending from 13 in 2019 to 9.9>9.1 this AM. FOBT positive on admission. Colonoscopy 2013 notable for diverticulosis, repeat recommended in 2023. No signs of acute bleed on exam. Labs notable for pancytopenia with Hgb 9.1, WBC 4.7, and Platelets of 114>96. Differential includes chronic alcohol use vs B12/Folate deficiency given peripheral neuropathy vs possible malignancy although weight stable which is reassuring. - consider GI consult vs workup outpatient - follow up B12/folate levels - daily CBC  H/o T2DM: Last A1C 5.6 in 10/2018. CBG overnight 120-150. Currently diet controlled. - daily BMP's    Alcohol Use Disorder  Tobacco Use Disorder: Drinks 2 quarts of malt liquor daily. Last drink on 9/25. AS/ALT WNL on admission. CIWA score of 3 overnight . Smokes 1PPD x 30+ years.  - thiamine, folic acid, MTVI - consider RUQ ultrasound to evaluate for cirrhosis - encourage alcohol and tobacco cessation - nicotine patch PRN - recommend low dose CT to screen for lung cancer  - CIWA without PRN ativan  Questionable Hepatitis C: Hep C antibody >11 in 05/2017. Denies official diagnosis of hepatitis. This was lost to follow up.  - recommend outpatient ID referral for Hep C at discharge  Bilateral Peripheral Neuropathy Home meds: Gabapentin 300mg  QD.  Described feet numbness/tingling at night. Given pancytopenia, must consider B12/Folate deficiency. - hold home meds - follow up  folate/B12 level  FEN/GI: Regular Diet PPx: SCDs  Disposition: home pending improvement  Subjective:  Patient notes lightheadedness is somewhat improved  but still present when ambulating this AM. Denies any chest pain, SOB. Normal bowel movements without any melena, hematochezia. Notes he lives with his cousin that is in similar health as he is.   Objective: Temp:  [97.7 F (36.5 C)-98.2 F (36.8 C)] 97.7 F (36.5 C) (09/27 0551) Pulse Rate:  [49-80] 50 (09/27 0551) Resp:  [14-26] 16 (09/27 0551) BP: (105-149)/(65-94) 148/79 (09/27 0551) SpO2:  [97 %-100 %] 100 % (09/27 0551) Weight:  [113.1 kg-113.9 kg] 113.1 kg (09/27 0532) Physical Exam: General: well nourished, well developed, in no acute distress with non-toxic appearance, lying comfortably in bed CV: regular rate and rhythm without murmurs, rubs, or gallops, no lower extremity edema bilaterally Lungs: clear to auscultation bilaterally with normal work of breathing on room air Abdomen: soft, non-tender, non-distended, normoactive bowel sounds Skin: warm, dry Extremities: warm and well perfused MSK: gait normal, some lightheadedness with ambulation Neuro: Alert and oriented, speech normal   Laboratory: Recent Labs  Lab 01/22/19 1441 01/23/19 0544  WBC 5.3 4.7  HGB 9.9* 9.1*  HCT 29.8* 27.3*  PLT 114* 96*   Recent Labs  Lab 01/22/19 1441 01/23/19 0544  NA 136 139  K 3.2* 3.3*  CL 104 113*  CO2 20* 17*  BUN 47* 43*  CREATININE 4.08* 3.25*  CALCIUM 9.9 9.0  PROT 7.8  --   BILITOT 0.3  --   ALKPHOS 42  --   ALT 45*  --   AST 83*  --   GLUCOSE 158* 124*    PT/INR: 13.2/1.0 PTT 30 Mag 2.1>2.0 Phos 4.6 FOBT: positive COVID neg FeUrea: pending  Imaging/Diagnostic Tests: Ct Head Wo Contrast  Result Date: 01/22/2019 CLINICAL DATA:  Headache. EXAM: CT HEAD WITHOUT CONTRAST TECHNIQUE: Contiguous axial images were obtained from the base of the skull through the vertex without intravenous contrast. COMPARISON:  CT scan of May 27, 2017. FINDINGS: Brain: No evidence of acute infarction, hemorrhage, hydrocephalus, extra-axial collection or mass lesion/mass  effect. Vascular: No hyperdense vessel or unexpected calcification. Skull: Normal. Negative for fracture or focal lesion. Sinuses/Orbits: No acute finding. Other: None. IMPRESSION: Normal head CT. Electronically Signed   By: Marijo Conception M.D.   On: 01/22/2019 15:34   Danna Hefty, DO 01/23/2019, 7:02 AM PGY-2, [place] Intern pager: [phone], text pages welcome

## 2019-01-23 NOTE — Progress Notes (Signed)
MD gave verbal order to stop NS at 125 ml/hr. Order followed.

## 2019-01-23 NOTE — Evaluation (Signed)
Occupational Therapy Evaluation Patient Details Name: Daniel [name] MRN: [id number] DOB: Apr 14, 1961 Today's Date: 01/23/2019    History of Present Illness Daniel [name] is a 58 y.o. male presenting after presyncopal episode . PMH is significant for T2DM, HTN, CKD, Chronic foot and shoulder pain, Hep C, EtOH abuse, substance abuse, intracerebral hemorrhage   Clinical Impression   Pt PTA: living with cousin and reports independence. Pt currently limited by dizziness in standing  > 1 minute and poor activity tolerance. Pt currently supervisionA for ADL tasks and mobility in room - taking only a few steps toward chair. BPs in vitals portion of chart: supine: 156/81; sit 108/95, stand 101/63 after 3 mins standing: 110/72. Pt's BP too low to ambulate in room. Pt would benefit from continued OT to address energy conservation and mobility. OT following acutely.     Follow Up Recommendations  No OT follow up;Supervision - Intermittent    Equipment Recommendations  None recommended by OT    Recommendations for Other Services       Precautions / Restrictions Precautions Precautions: Fall;Other (comment)(orthostatic hypotension) Precaution Comments: orthostatic hypotension Restrictions Weight Bearing Restrictions: No      Mobility Bed Mobility Overal bed mobility: Modified Independent                Transfers Overall transfer level: Needs assistance   Transfers: Sit to/from Stand;Stand Pivot Transfers Sit to Stand: Min guard Stand pivot transfers: Min guard       General transfer comment: [email or web address] for safety due to low BP in standing    Balance Overall balance assessment: Needs assistance   Sitting balance-Leahy Scale: Good     Standing balance support: No upper extremity supported Standing balance-Leahy Scale: Fair                             ADL either performed or assessed with clinical judgement   ADL Overall ADL's : At baseline                                        General ADL Comments: Pt requires assist due to dizziness. pt performing own toilet hygiene at [facility] by bed in standing; pt's BP too low to ambulate in room.     Vision Baseline Vision/History: No visual deficits Vision Assessment?: No apparent visual deficits     Perception     Praxis      Pertinent Vitals/Pain Pain Assessment: No/denies pain     Hand Dominance     Extremity/Trunk Assessment Upper Extremity Assessment Upper Extremity Assessment: Overall WFL for tasks assessed   Lower Extremity Assessment Lower Extremity Assessment: Defer to PT evaluation   Cervical / Trunk Assessment Cervical / Trunk Assessment: Normal   Communication Communication Communication: No difficulties   Cognition Arousal/Alertness: Awake/alert Behavior During Therapy: WFL for tasks assessed/performed Overall Cognitive Status: Within Functional Limits for tasks assessed                                     General Comments  BPs in vitals portion of chart: supine: 156/81; sit 108/95, stand 101/63 after 3 mins standing: 110/72.     Exercises     Shoulder Instructions      Home Living Family/patient expects to be discharged to:: Private  residence Living Arrangements: Other relatives Available Help at Discharge: Family(cousin) Type of Home: Apartment Home Access: Stairs to enter Entrance Stairs-Number of Steps: 2 Entrance Stairs-Rails: None Home Layout: One level     Bathroom Shower/Tub: Occupational psychologist: Standard     Home Equipment: Grab bars - tub/shower          Prior Functioning/Environment Level of Independence: Independent        Comments: driving, grocery shopping        OT Problem List: Decreased activity tolerance;Impaired balance (sitting and/or standing)      OT Treatment/Interventions: Self-care/ADL training;Therapeutic exercise;Neuromuscular education;Energy conservation     OT Goals(Current goals can be found in the care plan section) Acute Rehab OT Goals Patient Stated Goal: to go home OT Goal Formulation: With patient Time For Goal Achievement: 02/06/19 Potential to Achieve Goals: Good ADL Goals Additional ADL Goal #1: Pt will perform OOB ADL with modified independence with no safety cueing for steps to take when pt gets dizzy. Additional ADL Goal #2: Pt will state 3 ways to conserve energy in order to perform own ADL tasks.  OT Frequency: Min 2X/week   Barriers to D/C:            Co-evaluation              AM-PAC OT "6 Clicks" Daily Activity     Outcome Measure Help from another person eating meals?: None Help from another person taking care of personal grooming?: None Help from another person toileting, which includes using toliet, bedpan, or urinal?: A Little Help from another person bathing (including washing, rinsing, drying)?: A Little Help from another person to put on and taking off regular upper body clothing?: None Help from another person to put on and taking off regular lower body clothing?: A Little 6 Click Score: 21   End of Session Equipment Utilized During Treatment: Gait belt Nurse Communication: Mobility status  Activity Tolerance: Patient tolerated treatment well Patient left: in chair;with call bell/phone within reach;with nursing/sitter in room;with chair alarm set  OT Visit Diagnosis: Unsteadiness on feet (R26.81);Muscle weakness (generalized) (M62.81)                Time: 9983-3825 OT Time Calculation (min): 30 min Charges:  OT General Charges $OT Visit: 1 Visit OT Evaluation $OT Eval Moderate Complexity: 1 Mod  Darryl Nestle) Marsa Aris OTR/L Acute Rehabilitation Services Pager: [phone] Office: [phone]   Audie Pinto 01/23/2019, 12:48 PM

## 2019-01-23 NOTE — Discharge Summary (Signed)
[facility] Discharge Summary  Patient name: Daniel [name] Medical record number: [id number] Date of birth: February 16, 1961 Age: 58 y.o. Gender: male Date of Admission: 01/22/2019  Date of Discharge: 01/24/2019 Admitting Physician: Leeanne Rio, MD  Primary Care Provider: [name], NP Consultants: None  Indication for Hospitalization: Lightheadedness  Discharge Diagnoses/Problem List:  Orthostatic Hypotension Macrocytic anemia Acute on chronic kidney disease Hypokalemia Melena Alcohol use disorder Tobacco use disorder Questionable Hepatitis C Bilateral peripheral neuropathy  Disposition: to home  Discharge Condition: stable and improved  Brief Hospital Course:  Daniel [name] is a 58 y.o. male with past medical history significant for h/o diet controlled T2DM, HTN, CKD, Chronic foot and shoulder pain, Hep C, EtOH abuse, substance abuse, intracerebral hemorrhage (04/2017) without residual defects, who presented with lightheadedness  found to have orthostatic hypotension and symptomatic anemia. Hospital course outlined below:  Orthostatic Hypotension: Patient presented with worsening lightheadedness with ambulation and found to have orthostatic hypotension. It was felt symptoms may be secondary to polypharmacy and poor health literacy in regards to appropriate medication administration. Patient was on 4 different blood pressure medicines on admission but was unclear on what and when to take. Blood pressure on admission was low and orthostatic blood pressures were positive. His medications were discontinued on admission except for 1/2 dose of his home Clonidine to prevent rebound tachycardia and hypertension, with improvement in his blood pressure and symptoms. He was discharged on 9/28.  Macrocytic Anemia  Pancytopenia: Patient was noted to have hemoglobin of 9.9 on admission, down from 13.0 in Dec 2019. MCV was elevated at 105. Patient  does have a history of chronic alcohol abuse which is likely contributing. He was also notable to be FOBT positive with a history of diverticulosis on last colonoscopy in 2013. He had no signs of active bleed.  Vitamin B12 and folate levels were within normal limits. Expect this anemia was also contributing to his presenting symptoms.      Acute on Chronic Kidney Disease: Patient presented with creatinine of 4.08, with a baseline of (1.0-2.5). This slowly down trended with fluid resuscitation and resolved by discharge. FeUrea level was 14%, confirming pre-renal etiology.  Issues for Follow Up:  1. Please ensure patient follows up with Infectious disease outpatient for further evaluation of Hepatitis C antibody 2. Please check blood pressure and management accordingly with emphasis on decreasing polypharmacy 3. Please ensure patient follows up with GI for further evaluation of chronic macrocytic anemia 4. Please continue to encourage smoking and alcohol cessation  Significant Procedures:  CT head without contrast Echocardiogram  Significant Labs and Imaging:  Recent Labs  Lab 01/22/19 1441 01/23/19 0544 01/23/19 1014 01/24/19 0704  WBC 5.3 4.7  --  5.0  HGB 9.9* 9.1*  --  9.7*  HCT 29.8* 27.3* 27.5* 28.9*  PLT 114* 96*  --  100*   Recent Labs  Lab 01/22/19 1441 01/23/19 0544 01/24/19 0704  NA 136 139 142  K 3.2* 3.3* 4.0  CL 104 113* 116*  CO2 20* 17* 20*  GLUCOSE 158* 124* 108*  BUN 47* 43* 32*  CREATININE 4.08* 3.25* 2.69*  CALCIUM 9.9 9.0 8.8*  MG 2.1 2.0  --   PHOS 4.6  --   --   ALKPHOS 42  --   --   AST 83*  --   --   ALT 45*  --   --   ALBUMIN 3.1*  --   --  PT/INR: 13.2/1.0 PTT 30 Mag 2.1>2.0 Phos 4.6 FOBT: positive COVID neg FeUrea 14% Echo EF 55 to 60%. The left ventricle has normal function. Normal left ventricular size. There is mildly increased left ventricular hypertrophy  Ct Head Wo Contrast  Result Date: 01/22/2019 CLINICAL DATA:  Headache.  EXAM: CT HEAD WITHOUT CONTRAST TECHNIQUE: Contiguous axial images were obtained from the base of the skull through the vertex without intravenous contrast. COMPARISON:  CT scan of May 27, 2017. FINDINGS: Brain: No evidence of acute infarction, hemorrhage, hydrocephalus, extra-axial collection or mass lesion/mass effect. Vascular: No hyperdense vessel or unexpected calcification. Skull: Normal. Negative for fracture or focal lesion. Sinuses/Orbits: No acute finding. Other: None. IMPRESSION: Normal head CT. Electronically Signed   By: Marijo Conception M.D.   On: 01/22/2019 15:34   Results/Tests Pending at Time of Discharge: Hep C RNA quant 3,730,000  Discharge Medications:  Allergies as of 01/24/2019   No Known Allergies     Medication List    STOP taking these medications   amLODipine 10 MG tablet Commonly known as: NORVASC   hydrochlorothiazide 25 MG tablet Commonly known as: HYDRODIURIL   losartan 50 MG tablet Commonly known as: Cozaar     TAKE these medications   blood glucose meter kit and supplies Kit Dispense based on patient and insurance preference. Use up to four times daily as directed. (FOR ICD-9 250.00, 250.01).   cloNIDine 0.2 MG tablet Commonly known as: CATAPRES Take 1 tablet (0.2 mg total) by mouth 2 (two) times daily.   ferrous sulfate 325 (65 FE) MG tablet Take 1 tablet (325 mg total) by mouth daily with breakfast.   folic acid 1 MG tablet Commonly known as: FOLVITE Take 1 tablet (1 mg total) by mouth daily.   gabapentin 300 MG capsule Commonly known as: NEURONTIN Take 1 capsule (300 mg total) by mouth 3 (three) times daily. Patient takes 1 tablet daily and second dose as needed What changed: when to take this   senna 8.6 MG Tabs tablet Commonly known as: SENOKOT Take 1 tablet (8.6 mg total) by mouth daily.   thiamine 100 MG tablet Take 1 tablet (100 mg total) by mouth daily.   triamcinolone cream 0.1 % Commonly known as: KENALOG Apply 1  application topically 2 (two) times daily.       Discharge Instructions: Please refer to Patient Instructions section of EMR for full details.  Patient was counseled important signs and symptoms that should prompt return to medical care, changes in medications, dietary instructions, activity restrictions, and follow up appointments.   Follow-Up Appointments: Follow-up Information    [name], NP. Schedule an appointment as soon as possible for a visit on 01/31/2019.   Specialty: Internal Medicine Contact information: [place] [address] [phone]           Gladys Damme, MD 01/26/2019, 10:53 PM PGY-1, [place]

## 2019-01-23 NOTE — Evaluation (Signed)
Physical Therapy Evaluation Patient Details Name: Daniel [name] MRN: [id number] DOB: [birth year] Today's Date: 01/23/2019   History of Present Illness  Daniel [name] is a 58 y.o. male presenting after presyncopal episode . PMH is significant for T2DM, HTN, CKD, Chronic foot and shoulder pain, Hep C, EtOH abuse, substance abuse, intracerebral hemorrhage  Clinical Impression   Pt admitted with above diagnosis. Pt PTA: living with cousin and reports independence. Pt currently limited by dizziness in standing  > 1 minute and poor activity tolerance. Pt currently supervisionA for ADL tasks and mobility in room - taking only a few steps toward chair, then getting to the sink to wash hands; he did experience quite a BP rop with the postural change to standing; see below;  Pt currently with functional limitations due to the deficits listed below (see PT Problem List). Pt will benefit from skilled PT to increase their independence and safety with mobility to allow discharge to the venue listed below.       01/23/19 1100 01/23/19 1142 01/23/19 1146  Vital Signs  Patient Position (if appropriate) Orthostatic Vitals  --   --   Orthostatic Lying   BP- Lying 156/81  --   --   Pulse- Lying 50  --   --   Orthostatic Sitting  BP- Sitting (!) 108/95 (!) 77/66 (after attempting to take standing BP)  --   Pulse- Sitting 63 77  --   Orthostatic Standing at 0 minutes  BP- Standing at 0 minutes  (unable to stand long enough)  --  104/67  Pulse- Standing at 0 minutes  --   --  90  Orthostatic Standing at 3 minutes  BP- Standing at 3 minutes  --   --   --   Pulse- Standing at 3 minutes  --   --   --     01/23/19 1149  Vital Signs  Patient Position (if appropriate)  --   Orthostatic Lying   BP- Lying  --   Pulse- Lying  --   Orthostatic Sitting  BP- Sitting  --   Pulse- Sitting  --   Orthostatic Standing at 0 minutes  BP- Standing at 0 minutes  --   Pulse- Standing at 0 minutes  --    Orthostatic Standing at 3 minutes  BP- Standing at 3 minutes 110/72  Pulse- Standing at 3 minutes 86     Follow Up Recommendations No PT follow up    Equipment Recommendations  None recommended by PT    Recommendations for Other Services       Precautions / Restrictions Precautions Precautions: Fall;Other (comment)(orthostatic hypotension) Precaution Comments: orthostatic hypotension      Mobility  Bed Mobility Overal bed mobility: Modified Independent                Transfers Overall transfer level: Needs assistance Equipment used: None Transfers: Sit to/from [facility] Sit to Stand: Min guard Stand pivot transfers: Min guard       General transfer comment: [email or web address] for safety due to low BP in standing  Ambulation/Gait Ambulation/Gait assistance: Min guard Gait Distance (Feet): 30 Feet Assistive device: None Gait Pattern/deviations: Step-through pattern Gait velocity: slow   General Gait Details: Cues to self-monitor for activity tolerance  Stairs            Wheelchair Mobility    Modified Rankin (Stroke Patients Only)       Balance     Sitting balance-Leahy Scale: Good  Standing balance support: No upper extremity supported Standing balance-Leahy Scale: Fair                               Pertinent Vitals/Pain Pain Assessment: No/denies pain    Home Living Family/patient expects to be discharged to:: Private residence Living Arrangements: Other relatives Available Help at Discharge: Family Type of Home: Apartment Home Access: Stairs to enter Entrance Stairs-Rails: None Entrance Stairs-Number of Steps: 2 Home Layout: One level Home Equipment: Grab bars - tub/shower      Prior Function Level of Independence: Independent         Comments: driving, grocery shopping     Hand Dominance        Extremity/Trunk Assessment   Upper Extremity Assessment Upper Extremity Assessment: Defer  to OT evaluation    Lower Extremity Assessment Lower Extremity Assessment: Overall WFL for tasks assessed    Cervical / Trunk Assessment Cervical / Trunk Assessment: Normal  Communication   Communication: No difficulties  Cognition Arousal/Alertness: Awake/alert Behavior During Therapy: WFL for tasks assessed/performed Overall Cognitive Status: Within Functional Limits for tasks assessed                                        General Comments General comments (skin integrity, edema, etc.): BPs in vitals portion of chart: supine: 156/81; sit 108/95, stand 77/66 (with symptomatic for dizziness), then 101/63 after 3 mins standing: 110/72.     Exercises     Assessment/Plan    PT Assessment Patient needs continued PT services  PT Problem List Decreased strength;Decreased activity tolerance;Decreased balance;Decreased mobility;Decreased knowledge of use of DME       PT Treatment Interventions DME instruction;Gait training;Stair training;Functional mobility training;Therapeutic activities;Therapeutic exercise;Balance training    PT Goals (Current goals can be found in the Care Plan section)  Acute Rehab PT Goals Patient Stated Goal: to go home PT Goal Formulation: With patient Time For Goal Achievement: 02/06/19 Potential to Achieve Goals: Good    Frequency Min 3X/week   Barriers to discharge Decreased caregiver support Need more stable BP for safe dc home    Co-evaluation               AM-PAC PT "6 Clicks" Mobility  Outcome Measure Help needed turning from your back to your side while in a flat bed without using bedrails?: None Help needed moving from lying on your back to sitting on the side of a flat bed without using bedrails?: None Help needed moving to and from a bed to a chair (including a wheelchair)?: None Help needed standing up from a chair using your arms (e.g., wheelchair or bedside chair)?: None Help needed to walk in hospital room?: A  Little Help needed climbing 3-5 steps with a railing? : A Little 6 Click Score: 22    End of Session Equipment Utilized During Treatment: Gait belt Activity Tolerance: Patient tolerated treatment well Patient left: in chair;with call bell/phone within reach;with nursing/sitter in room Nurse Communication: Mobility status PT Visit Diagnosis: Other abnormalities of gait and mobility (R26.89)    Time: 1287-8676 PT Time Calculation (min) (ACUTE ONLY): 30 min   Charges:   PT Evaluation $PT Eval Moderate Complexity: [place], [place] Pager [phone] Office [phone]   Rockne Coons  Gregary Cromer 01/23/2019, 3:27 PM

## 2019-01-23 NOTE — Progress Notes (Signed)
  Echocardiogram 2D Echocardiogram has been performed.  Bobbye [place] 01/23/2019, 10:57 AM

## 2019-01-24 LAB — FOLATE RBC
Folate, Hemolysate: 296 ng/mL
Folate, RBC: 1076 ng/mL (ref >498)
Hematocrit: 27.5 % — ABNORMAL LOW (ref 37.5–51.0)

## 2019-01-24 LAB — CBC
HCT: 28.9 % — ABNORMAL LOW (ref 39.0–52.0)
Hemoglobin: 9.7 g/dL — ABNORMAL LOW (ref 13.0–17.0)
MCH: 35.3 pg — ABNORMAL HIGH (ref 26.0–34.0)
MCHC: 33.6 g/dL (ref 30.0–36.0)
MCV: 105.1 fL — ABNORMAL HIGH (ref 80.0–100.0)
Platelets: 100 10*3/uL — ABNORMAL LOW (ref 150–400)
RBC: 2.75 MIL/uL — ABNORMAL LOW (ref 4.22–5.81)
RDW: 13.7 % (ref 11.5–15.5)
WBC: 5 10*3/uL (ref 4.0–10.5)
nRBC: 0 % (ref 0.0–0.2)

## 2019-01-24 LAB — BASIC METABOLIC PANEL
Anion gap: 6 (ref 5–15)
BUN: 32 mg/dL — ABNORMAL HIGH (ref 6–20)
CO2: 20 mmol/L — ABNORMAL LOW (ref 22–32)
Calcium: 8.8 mg/dL — ABNORMAL LOW (ref 8.9–10.3)
Chloride: 116 mmol/L — ABNORMAL HIGH (ref 98–111)
Creatinine, Ser: 2.69 mg/dL — ABNORMAL HIGH (ref 0.61–1.24)
GFR calc Af Amer: 29 mL/min — ABNORMAL LOW (ref >60)
GFR calc non Af Amer: 25 mL/min — ABNORMAL LOW (ref >60)
Glucose, Bld: 108 mg/dL — ABNORMAL HIGH (ref 70–99)
Potassium: 4 mmol/L (ref 3.5–5.1)
Sodium: 142 mmol/L (ref 135–145)

## 2019-01-24 LAB — IRON AND TIBC
Iron: 35 ug/dL — ABNORMAL LOW (ref 45–182)
Saturation Ratios: 8 % — ABNORMAL LOW (ref 17.9–39.5)
TIBC: 420 ug/dL (ref 250–450)
UIBC: 385 ug/dL

## 2019-01-24 LAB — RETICULOCYTES
Immature Retic Fract: 26 % — ABNORMAL HIGH (ref 2.3–15.9)
RBC.: 2.75 MIL/uL — ABNORMAL LOW (ref 4.22–5.81)
Retic Count, Absolute: 85 10*3/uL (ref 19.0–186.0)
Retic Ct Pct: 3.1 % (ref 0.4–3.1)

## 2019-01-24 LAB — UREA NITROGEN, URINE: Urea Nitrogen, Ur: 290 mg/dL

## 2019-01-24 LAB — FERRITIN: Ferritin: 21 ng/mL — ABNORMAL LOW (ref 24–336)

## 2019-01-24 MED ORDER — GABAPENTIN 300 MG PO CAPS: 300.0000 mg | ORAL_CAPSULE | Freq: Three times a day (TID) | ORAL | 0 refills | Status: DC

## 2019-01-24 MED ORDER — FERROUS SULFATE 325 (65 FE) MG PO TABS: 325.0000 mg | ORAL_TABLET | Freq: Every day | ORAL | 0 refills | Status: DC

## 2019-01-24 MED ORDER — FERROUS SULFATE 325 (65 FE) MG PO TABS: 325.0000 mg | ORAL_TABLET | Freq: Every day | ORAL | Status: DC

## 2019-01-24 MED ORDER — FOLIC ACID 1 MG PO TABS: 1.0000 mg | ORAL_TABLET | Freq: Every day | ORAL | 0 refills | Status: DC

## 2019-01-24 MED ORDER — THIAMINE HCL 100 MG PO TABS: 100.0000 mg | ORAL_TABLET | Freq: Every day | ORAL | 0 refills | Status: DC

## 2019-01-24 NOTE — Progress Notes (Signed)
PT Cancellation Note  Patient Details Name: JEROMEY KRUER MRN: [id number] DOB: 09/22/1960   Cancelled Treatment:    Reason Eval/Treat Not Completed: Other (comment)   Reports he is feeling much better; Politely declining PT, hoping to dc later today;   Roney [place], [place] Pager [phone] Office [phone]    Colletta [place] 01/24/2019, 2:45 PM

## 2019-01-24 NOTE — Plan of Care (Signed)
  Problem: Education: Goal: Knowledge of General Education information will improve Description Including pain rating scale, medication(s)/side effects and non-pharmacologic comfort measures Outcome: Progressing   Problem: Clinical Measurements: Goal: Ability to maintain clinical measurements within normal limits will improve Outcome: Progressing   Problem: Activity: Goal: Risk for activity intolerance will decrease Outcome: Progressing   Problem: Safety: Goal: Ability to remain free from injury will improve Outcome: Progressing   

## 2019-01-24 NOTE — Progress Notes (Addendum)
Family Medicine Teaching Service Daily Progress Note Intern Pager: [phone]  Patient name: Daniel [name] Medical record number: [id number] Date of birth: 03-23-1961 Age: 58 y.o. Gender: male  Primary Care Provider: [name], NP Consultants: None Code Status: Full  Pt Overview and Major Events to Date:  9/26 - Admitted to FPTS  Assessment and Plan: Daniel [name] is a 58 y.o. male presenting after presyncopal episode . 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  Orthostatic Hypotension and Symptomatic Anemia  Chronic Hypertension: Patient states that he feels much better today, reports no dizziness.  Orthostatic vitals positive today, but much improved from admission: 40 mmHg difference between lying and sitting systolic, heart rate appropriate, blood pressure while standing for 3 minutes also appropriate 110/70.  We will continue home clonidine, counseled patient about medication compliance.  Referring patient to PCP for optimization as an outpatient. - continue home Clonidine 0.1mg  to prevent rebound HTN and tachycardia - continue to hold losartan, HCTZ, amlodipine  Acute anemia, macrocytic He reports occasional bloody bowel movements.  FOBT positive.  GI was curb sided and suggest that patient be worked up as an outpatient.  Vitamin B12 within normal limits. -Folate level pending  Hypokalemia: resolved Potassium 3.2 on admission, repleted to 4.0 today.  Acute on CKD: improved Cr 4.08>3.25>2.69 (BL 1.0-2.5). FEUrea 14%, pre-renal as suspected.  Continue to hold home medications as patient has orthostasis on occasion, and BPs have been appropriate 150s over 70s in the last 24 hours.  Will refer to PCP for outpatient optimization. - hold nephrotoxic agents  - daily BMPs - hold home HCTZ and losartan  H/o T2DM: Last A1C 5.6 in 10/2018. CBG  overnight 120-150. Currently diet controlled. - daily BMP's    Alcohol Use Disorder  Tobacco Use Disorder: Drinks 2 quarts of malt liquor daily. Last drink on 9/25. AS/ALT WNL on admission. CIWA score of 1 overnight . Smokes 1PPD x 30+ years.  - thiamine, folic acid, MTVI - encourage alcohol and tobacco cessation - nicotine patch PRN - recommend low dose CT to screen for lung cancer  - CIWA without PRN ativan  Questionable Hepatitis C: Hep C antibody >11 in 05/2017. Denies official diagnosis of hepatitis. This was lost to follow up. Will obtain Hep C viral load. - recommend outpatient ID referral for Hep C at discharge  Bilateral Peripheral Neuropathy Home meds: Gabapentin 300mg  QD.  Patient mistakenly thought gabapentin was actually hypertensive medication, only taking 300 mg once a day.  However it appears that gabapentin was discontinued in August by his primary care provider, as it appeared it did not work, but likely due to misunderstanding by patient.  Described feet numbness/tingling at night.  B12 within normal limits, folate pending.  Likely to be due to diabetic nephropathy. - restart gabapentin 300 mg qd TID - medication counseling  FEN/GI: Regular Diet PPx: SCDs  Disposition: to home later today after medication counseling  Subjective:  Is feeling well today, reports no dizziness.  Asking to go home.  His complaint is regarding his medication for nerve pain.  Patient has neuropathic pain, appears that gabapentin was discontinued in August as it was not working to treat his pain, however seems that patient was confused and that gabapentin was his hypertensive medication, and was only taking 300 mg once a day.  Will counsel patient on appropriate medication use, give strict guidelines on how  to use medication.  And reinstitute gabapentin appropriately.  Objective: Temp:  [98.2 F (36.8 C)-98.6 F (37 C)] 98.3 F (36.8 C) (09/28 0338) Pulse Rate:  [51-57] 57 (09/27  2124) Resp:  [18-20] 20 (09/28 0338) BP: (140-150)/(78-80) 150/79 (09/27 2124) SpO2:  [98 %-100 %] 98 % (09/27 2124)  Physical Exam: Physical Exam Constitutional:      General: He is not in acute distress.    Appearance: Normal appearance. He is obese. He is not ill-appearing.  HENT:     Head: Normocephalic and atraumatic.     Mouth/Throat:     Mouth: Mucous membranes are moist.  Eyes:     General: No scleral icterus.    Conjunctiva/sclera: Conjunctivae normal.     Pupils: Pupils are equal, round, and reactive to light.  Cardiovascular:     Rate and Rhythm: Normal rate and regular rhythm.     Heart sounds: No murmur. No friction rub. No gallop.   Pulmonary:     Effort: Pulmonary effort is normal.     Breath sounds: Normal breath sounds. No wheezing, rhonchi or rales.  Abdominal:     General: Abdomen is flat. Bowel sounds are normal. There is no distension.     Palpations: Abdomen is soft.     Tenderness: There is no abdominal tenderness.  Musculoskeletal:        General: No swelling or tenderness.     Right lower leg: No edema.     Left lower leg: No edema.  Skin:    General: Skin is warm and dry.     Capillary Refill: Capillary refill takes less than 2 seconds.     Coloration: Skin is not jaundiced or pale.  Neurological:     General: No focal deficit present.     Mental Status: He is alert and oriented to person, place, and time. Mental status is at baseline.  Psychiatric:        Mood and Affect: Mood normal.        Behavior: Behavior normal.     Laboratory: Recent Labs  Lab 01/22/19 1441 01/23/19 0544  WBC 5.3 4.7  HGB 9.9* 9.1*  HCT 29.8* 27.3*  PLT 114* 96*   Recent Labs  Lab 01/22/19 1441 01/23/19 0544  NA 136 139  K 3.2* 3.3*  CL 104 113*  CO2 20* 17*  BUN 47* 43*  CREATININE 4.08* 3.25*  CALCIUM 9.9 9.0  PROT 7.8  --   BILITOT 0.3  --   ALKPHOS 42  --   ALT 45*  --   AST 83*  --   GLUCOSE 158* 124*    PT/INR: 13.2/1.0 PTT 30 Mag  2.1>2.0 Phos 4.6 FOBT: positive COVID neg FeUrea: 14%, pre-renal  Imaging/Diagnostic Tests: Ct Head Wo Contrast  Result Date: 01/22/2019 CLINICAL DATA:  Headache. EXAM: CT HEAD WITHOUT CONTRAST TECHNIQUE: Contiguous axial images were obtained from the base of the skull through the vertex without intravenous contrast. COMPARISON:  CT scan of May 27, 2017. FINDINGS: Brain: No evidence of acute infarction, hemorrhage, hydrocephalus, extra-axial collection or mass lesion/mass effect. Vascular: No hyperdense vessel or unexpected calcification. Skull: Normal. Negative for fracture or focal lesion. Sinuses/Orbits: No acute finding. Other: None. IMPRESSION: Normal head CT. Electronically Signed   By: Marijo Conception M.D.   On: 01/22/2019 15:34   Gladys Damme, MD PGY-1, [facility] Intern pager: [phone], text pages welcome

## 2019-01-24 NOTE — Progress Notes (Addendum)
DISCHARGE NOTE  Daniel [name] to be discharged Home per MD order. Patient verbalized understanding.  Skin clean, dry and intact without evidence of skin break down, no evidence of skin tears noted. IV catheter discontinued intact. Site without signs and symptoms of complications. Dressing and pressure applied. Pt denies pain at the site currently. No complaints noted.  Patient free of lines, drains, and wounds.   Discharge packet assembled. An After Visit Summary (AVS) was printed and given to the patient at bedside. Patient escorted via stretcher and discharged to designated family member via private vehicle. All questions and concerns addressed.   Dolores Hoose, RN

## 2019-01-24 NOTE — Progress Notes (Addendum)
Entered in error

## 2019-01-24 NOTE — Discharge Instructions (Signed)
Was a pleasure taking care of you while you are in the hospital!  You were treated for low blood pressure and low blood counts.  You are to take the following medications in the following formats: 1. Clonidine 0.2 mg - take 1 pill two times per day for High blood pressure    - do not stop taking this pill suddenly, or else you may have low blood pressure and fast heart rate. Please           see your PCP for further management of your blood pressure. 2. Gabapentin 300 mg- take 1 pill three times per day for foot/leg pain 3. Senna (senokot) 8.6 mg- you can take 1 pill daily as needed to help have a bowel movement 4. Iron supplement- take 1 pill daily for low blood count (hemoglobin) 5. Thiamine- take 1 pill daily for low vitamins from drinking alcohol 6. Folic acid- take 1 pill daily for low blood count (hemoglobin) 7. Multivitamin- take 1 pill daily for low vitamins from drinking alcohol  DO NOT take any other medication than those listed above until you visit your primary care provider.  You are to follow up with your primary care provider about your blood pressure and low blood count (hemoglobin).   You also may have a viral infection in your liver called Hepatitis C. We have referred you to see the infectious disease doctors for more evaluation and possible treatment.  We recommend you decrease both your alcohol drinking and smoking. Please speak with your primary care provider if you need more community resources.  Be Well!

## 2019-01-25 LAB — HCV RNA QUANT
HCV Quantitative Log: 6.572 log10 IU/mL (ref >1.70)
HCV Quantitative: 3730000 IU/mL (ref >50)

## 2019-01-27 ENCOUNTER — Emergency Department (HOSPITAL_COMMUNITY)
Admission: EM | Admit: 2019-01-27 | Discharge: 2019-01-27 | Disposition: A | Discharge status: Home / Self Care | Payer: Self-pay | Attending: Emergency Medicine | Admitting: Emergency Medicine

## 2019-01-27 ENCOUNTER — Encounter (HOSPITAL_COMMUNITY): Payer: Self-pay

## 2019-01-27 ENCOUNTER — Other Ambulatory Visit: Payer: Self-pay

## 2019-01-27 DIAGNOSIS — Z79899 Other long term (current) drug therapy: Secondary | ICD-10-CM | POA: Insufficient documentation

## 2019-01-27 DIAGNOSIS — I10 Essential (primary) hypertension: Secondary | ICD-10-CM | POA: Insufficient documentation

## 2019-01-27 DIAGNOSIS — M79642 Pain in left hand: Secondary | ICD-10-CM | POA: Insufficient documentation

## 2019-01-27 DIAGNOSIS — F17210 Nicotine dependence, cigarettes, uncomplicated: Secondary | ICD-10-CM | POA: Insufficient documentation

## 2019-01-27 DIAGNOSIS — W19XXXA Unspecified fall, initial encounter: Secondary | ICD-10-CM | POA: Insufficient documentation

## 2019-01-27 DIAGNOSIS — D649 Anemia, unspecified: Secondary | ICD-10-CM | POA: Insufficient documentation

## 2019-01-27 DIAGNOSIS — N289 Disorder of kidney and ureter, unspecified: Secondary | ICD-10-CM | POA: Insufficient documentation

## 2019-01-27 DIAGNOSIS — R42 Dizziness and giddiness: Secondary | ICD-10-CM | POA: Insufficient documentation

## 2019-01-27 DIAGNOSIS — F1410 Cocaine abuse, uncomplicated: Secondary | ICD-10-CM | POA: Insufficient documentation

## 2019-01-27 DIAGNOSIS — E119 Type 2 diabetes mellitus without complications: Secondary | ICD-10-CM | POA: Insufficient documentation

## 2019-01-27 LAB — COMPREHENSIVE METABOLIC PANEL
ALT: 45 U/L — ABNORMAL HIGH (ref 0–44)
AST: 76 U/L — ABNORMAL HIGH (ref 15–41)
Albumin: 3 g/dL — ABNORMAL LOW (ref 3.5–5.0)
Alkaline Phosphatase: 36 U/L — ABNORMAL LOW (ref 38–126)
Anion gap: 10 (ref 5–15)
BUN: 34 mg/dL — ABNORMAL HIGH (ref 6–20)
CO2: 20 mmol/L — ABNORMAL LOW (ref 22–32)
Calcium: 9.2 mg/dL (ref 8.9–10.3)
Chloride: 111 mmol/L (ref 98–111)
Creatinine, Ser: 3.29 mg/dL — ABNORMAL HIGH (ref 0.61–1.24)
GFR calc Af Amer: 23 mL/min — ABNORMAL LOW (ref >60)
GFR calc non Af Amer: 19 mL/min — ABNORMAL LOW (ref >60)
Glucose, Bld: 149 mg/dL — ABNORMAL HIGH (ref 70–99)
Potassium: 4.1 mmol/L (ref 3.5–5.1)
Sodium: 141 mmol/L (ref 135–145)
Total Bilirubin: 0.5 mg/dL (ref 0.3–1.2)
Total Protein: 7.7 g/dL (ref 6.5–8.1)

## 2019-01-27 LAB — URINALYSIS, ROUTINE W REFLEX MICROSCOPIC
Bacteria, UA: NONE SEEN
Bilirubin Urine: NEGATIVE
Glucose, UA: NEGATIVE mg/dL
Ketones, ur: NEGATIVE mg/dL
Leukocytes,Ua: NEGATIVE
Nitrite: NEGATIVE
Protein, ur: 30 mg/dL — AB
Specific Gravity, Urine: 1.01 (ref 1.005–1.030)
pH: 5 (ref 5.0–8.0)

## 2019-01-27 LAB — ETHANOL: Alcohol, Ethyl (B): <10 mg/dL (ref <10)

## 2019-01-27 LAB — CBC
HCT: 28.9 % — ABNORMAL LOW (ref 39.0–52.0)
Hemoglobin: 9.8 g/dL — ABNORMAL LOW (ref 13.0–17.0)
MCH: 36.2 pg — ABNORMAL HIGH (ref 26.0–34.0)
MCHC: 33.9 g/dL (ref 30.0–36.0)
MCV: 106.6 fL — ABNORMAL HIGH (ref 80.0–100.0)
Platelets: 115 10*3/uL — ABNORMAL LOW (ref 150–400)
RBC: 2.71 MIL/uL — ABNORMAL LOW (ref 4.22–5.81)
RDW: 13.9 % (ref 11.5–15.5)
WBC: 5.5 10*3/uL (ref 4.0–10.5)
nRBC: 0 % (ref 0.0–0.2)

## 2019-01-27 LAB — RAPID URINE DRUG SCREEN, HOSP PERFORMED
Amphetamines: NOT DETECTED
Barbiturates: NOT DETECTED
Benzodiazepines: NOT DETECTED
Cocaine: POSITIVE — AB
Opiates: NOT DETECTED
Tetrahydrocannabinol: NOT DETECTED

## 2019-01-27 MED ORDER — CLONIDINE HCL 0.2 MG PO TABS
0.2000 mg | ORAL_TABLET | Freq: Once | ORAL | Status: AC
  Administered 2019-01-27: 0.2 mg via ORAL
  Filled 2019-01-27: qty 1

## 2019-01-27 NOTE — ED Triage Notes (Signed)
Pt arrives POV for eval of dizziness and fall. Pt reports that he fell yesteday, did no strike head, no thinners. Pt was recently admitted and d/c'd on 9/27 for orthostatic hypotension. States dizziness and weakness has been persistent since that time. Denies CP, endorses SOB

## 2019-01-27 NOTE — ED Notes (Signed)
Pt dc'd home w/all belongings, a/o x4, drove self home, no narcotics given in ED

## 2019-01-27 NOTE — Discharge Instructions (Signed)
Testing today did not show any serious problems.  Continue taking your usual medications.  Make sure you follow-up with the gastroenterologist about your anemia, as planned.  Do not use illegal drugs.  Call your primary care doctor for follow-up appointment in 1 or 2 weeks.  Make sure you are drinking plenty of fluids and eating a good diet every day.

## 2019-01-27 NOTE — ED Notes (Signed)
Pt given water, tolerated well

## 2019-01-27 NOTE — ED Provider Notes (Addendum)
[facility] EMERGENCY DEPARTMENT Provider Note   CSN: [id number] Arrival date & time: 01/27/19  1233     History   Chief Complaint Chief Complaint  Patient presents with  . Dizziness  . Fall    HPI Daniel [name] is a 58 y.o. male.     HPI   Patient is here for evaluation of persistent dizziness which occurs typically when he stands up and improves when he sits.  Symptoms are ongoing since recent hospitalization on 01/22/2019.  He was evaluated, and observed for 2 days.  He had renal insufficiency that improved with treatment and medication changes were done.  He was felt to have orthostatic hypotension.  Also noted to have microcytic anemia, with blood in stool.  He has been referred to gastroenterology.  Blood pressure medication modification included stopping amlodipine, HCTZ and losartan.  He was discharged on clonidine for high blood pressure.  Patient was able to drive himself here to be evaluated.  He states yesterday he fell injuring his left hand, but that pain has been mild and is almost gone.  He denies headache, chest pain, shortness of breath, focal weakness or paresthesia.  There are no other known modifying factors.  Past Medical History:  Diagnosis Date  . Diabetes mellitus without complication ([place])   . Hypertension   . Shoulder pain, left 06/28/2013    Patient Active Problem List   Diagnosis Date Noted  . Orthostatic hypotension 01/22/2019  . AKI (acute kidney injury) ([place])   . Anemia   . Heme positive stool   . ICH (intracerebral hemorrhage) ([place]) 05/20/2017  . Cellulitis of abdominal wall 06/29/2016  . Abscess of abdominal wall 06/29/2016  . Hypertension 06/29/2016  . DM2 (diabetes mellitus, type 2) ([place]) 06/29/2016  . Sepsis ([place]) 06/29/2016    Past Surgical History:  Procedure Laterality Date  . APPENDECTOMY    . COLONOSCOPY  01/23/2012   Procedure: COLONOSCOPY;  Surgeon: Danie Binder, MD;  [facility];  Service:  Endoscopy;  Laterality: N/A;  11:10 AM  . INCISION AND DRAINAGE ABSCESS N/A 06/29/2016   Procedure: INCISION AND DRAINAGE ABDOMINAL WALL ABSCESS;  Surgeon: Alphonsa Overall, MD;  Location: [facility] ORS;  Service: General;  Laterality: N/A;  . Left heel surgery          Home Medications    Prior to Admission medications   Medication Sig Start Date End Date Taking? Authorizing Provider  blood glucose meter kit and supplies KIT Dispense based on patient and insurance preference. Use up to four times daily as directed. (FOR ICD-9 250.00, 250.01). 07/01/16   Nita Sells, MD  cloNIDine (CATAPRES) 0.2 MG tablet Take 1 tablet (0.2 mg total) by mouth 2 (two) times daily. 01/17/19   [name], NP  ferrous sulfate 325 (65 FE) MG tablet Take 1 tablet (325 mg total) by mouth daily with breakfast. 01/25/19   Winfrey, Alcario Drought, MD  folic acid (FOLVITE) 1 MG tablet Take 1 tablet (1 mg total) by mouth daily. 01/25/19   Kathrene Alu, MD  gabapentin (NEURONTIN) 300 MG capsule Take 1 capsule (300 mg total) by mouth 3 (three) times daily. Patient takes 1 tablet daily and second dose as needed 01/24/19   Kathrene Alu, MD  senna (SENOKOT) 8.6 MG TABS tablet Take 1 tablet (8.6 mg total) by mouth daily. 01/17/19   [name], NP  thiamine 100 MG tablet Take 1 tablet (100 mg total) by mouth daily. 01/25/19  Kathrene Alu, MD  triamcinolone cream (KENALOG) 0.1 % Apply 1 application topically 2 (two) times daily. Patient not taking: Reported on 01/24/2019 11/01/18   [name], NP    Family History Family History  Problem Relation Age of Onset  . Colon cancer Neg Hx     Social History Social History   Tobacco Use  . Smoking status: Current Every Day Smoker    Packs/day: 1.00    Years: 30.00    Pack years: 30.00    Types: Cigarettes  . Smokeless tobacco: Never Used  Substance Use Topics  . Alcohol use: Yes    Alcohol/week: 12.0 standard drinks    Types: 12 Cans of beer per  week  . Drug use: Yes    Types: "Crack" cocaine     Allergies   Patient has no known allergies.   Review of Systems Review of Systems  All other systems reviewed and are negative.    Physical Exam Updated Vital Signs BP (!) 173/73 (BP Location: Left Arm)   Pulse 82   Temp 98.8 F (37.1 C) (Oral)   Resp 15   Ht 5' 10" (1.778 m)   Wt 106.6 kg   SpO2 99%   BMI 33.72 kg/m   Physical Exam Vitals signs and nursing note reviewed.  Constitutional:      General: He is not in acute distress.    Appearance: He is well-developed. He is obese. He is not ill-appearing.  HENT:     Head: Normocephalic and atraumatic.     Right Ear: External ear normal.     Left Ear: External ear normal.  Eyes:     Conjunctiva/sclera: Conjunctivae normal.     Pupils: Pupils are equal, round, and reactive to light.  Neck:     Musculoskeletal: Normal range of motion and neck supple.     Trachea: Phonation normal.  Cardiovascular:     Rate and Rhythm: Normal rate and regular rhythm.     Heart sounds: Normal heart sounds.  Pulmonary:     Effort: Pulmonary effort is normal.     Breath sounds: Normal breath sounds.  Abdominal:     General: There is no distension.     Palpations: Abdomen is soft.     Tenderness: There is no abdominal tenderness.  Musculoskeletal: Normal range of motion.  Skin:    General: Skin is warm and dry.  Neurological:     Mental Status: He is alert and oriented to person, place, and time.     Cranial Nerves: No cranial nerve deficit.     Sensory: No sensory deficit.     Motor: No abnormal muscle tone.     Coordination: Coordination normal.     Comments: No dysarthria, or aphasia.  No pronator drift.  Normal finger-to-nose bilaterally.  No ataxia.  No symptoms with standing.  Psychiatric:        Mood and Affect: Mood normal.        Behavior: Behavior normal.        Thought Content: Thought content normal.        Judgment: Judgment normal.      ED Treatments /  Results  Labs (all labs ordered are listed, but only abnormal results are displayed) Labs Reviewed  CBC - Abnormal; Notable for the following components:      Result Value   RBC 2.71 (*)    Hemoglobin 9.8 (*)    HCT 28.9 (*)    MCV 106.6 (*)  MCH 36.2 (*)    Platelets 115 (*)    All other components within normal limits  COMPREHENSIVE METABOLIC PANEL - Abnormal; Notable for the following components:   CO2 20 (*)    Glucose, Bld 149 (*)    BUN 34 (*)    Creatinine, Ser 3.29 (*)    Albumin 3.0 (*)    AST 76 (*)    ALT 45 (*)    Alkaline Phosphatase 36 (*)    GFR calc non Af Amer 19 (*)    GFR calc Af Amer 23 (*)    All other components within normal limits  URINALYSIS, ROUTINE W REFLEX MICROSCOPIC - Abnormal; Notable for the following components:   Hgb urine dipstick SMALL (*)    Protein, ur 30 (*)    All other components within normal limits  RAPID URINE DRUG SCREEN, HOSP PERFORMED - Abnormal; Notable for the following components:   Cocaine POSITIVE (*)    All other components within normal limits  ETHANOL  BLOOD GAS, VENOUS    EKG None  Radiology No results found.  Procedures Procedures (including critical care time)  Medications Ordered in ED Medications  cloNIDine (CATAPRES) tablet 0.2 mg (0.2 mg Oral Given 01/27/19 2014)     Initial Impression / Assessment and Plan / ED Course  I have reviewed the triage vital signs and the nursing notes.  Pertinent labs & imaging results that were available during my care of the patient were reviewed by me and considered in my medical decision making (see chart for details).  Clinical Course as of Jan 26 2034  Thu Jan 27, 2019  1832 Abnormal, presence of cocaine  Urine rapid drug screen (hosp performed)(!) [EW]  1832 Normal except presence of protein and small amount of hemoglobin  Urinalysis, Routine w reflex microscopic(!) [EW]  1833 Normal except CO2 low, glucose high, BUN high, creatinine high, albumin low, AST  high, ALT high, alkaline phosphatase low  Comprehensive metabolic panel(!) [EW]  0100 Normal  Ethanol [EW]  1833 Normal except hemoglobin low, MCV high, platelets low  CBC(!) [EW]  1914 Since arrival, blood pressure has trended up, will give a dose of clonidine.   [EW]    Clinical Course User Index [EW] Daleen Bo, MD        Patient Vitals for the past 24 hrs:  BP Temp Temp src Pulse Resp SpO2 Height Weight  01/27/19 1912 (!) 173/73 - - 82 15 99 % - -  01/27/19 1700 (!) 185/96 - - (!) 50 (!) 21 100 % - -  01/27/19 1615 (!) 167/89 - - (!) 51 19 98 % - -  01/27/19 1609 (!) 162/89 - - (!) 52 20 99 % - -  01/27/19 1259 - - - - - - 5' 10" (1.778 m) 106.6 kg  01/27/19 1238 126/87 98.8 F (37.1 C) Oral 67 18 98 % - -    8:35 PM Reevaluation with update and discussion. After initial assessment and treatment, an updated evaluation reveals he remains comfortable has no further complaints, findings discussed and questions answered. Daleen Bo   Medical Decision Making: Nonspecific dizziness, recently evaluated in hospital with comprehensive testing, observation and medication modification.  Patient has been abusing cocaine.  He has mildly worsened renal function from baseline, other parameters are roughly at baseline.  He is probably mildly dehydrated, without significant vital sign abnormalities.  I suspect this is been complicated by his recent substance abuse.  Renal function will likely improve now  that he is off and ARB, and drinks more fluid.  He is counseled to drink more fluid.  He is stable for outpatient management, as currently planned.  He has a primary care doctor, for follow-up.   CRITICAL CARE-no Performed by: Daleen Bo  Nursing Notes Reviewed/ Care Coordinated Applicable Imaging Reviewed Interpretation of Laboratory Data incorporated into ED treatment  The patient appears reasonably screened and/or stabilized for discharge and I doubt any other medical condition  or other [facility] requiring further screening, evaluation, or treatment in the ED at this time prior to discharge.  Plan: Home Medications-continue usual; Home Treatments-do not use cocaine; return here if the recommended treatment, does not improve the symptoms; Recommended follow up-PCP checkup 1 to 2 weeks.   Final Clinical Impressions(s) / ED Diagnoses   Final diagnoses:  Dizziness  Hypertension, unspecified type  Cocaine abuse ([place])  Anemia, unspecified type  Renal insufficiency    ED Discharge Orders    None       Daleen Bo, MD 01/27/19 2035    Daleen Bo, MD 01/27/19 2048

## 2019-01-31 ENCOUNTER — Other Ambulatory Visit: Payer: Self-pay

## 2019-01-31 ENCOUNTER — Encounter (INDEPENDENT_AMBULATORY_CARE_PROVIDER_SITE_OTHER): Payer: Self-pay | Admitting: Primary Care

## 2019-01-31 ENCOUNTER — Ambulatory Visit (INDEPENDENT_AMBULATORY_CARE_PROVIDER_SITE_OTHER): Payer: Self-pay | Admitting: Primary Care

## 2019-01-31 VITALS — BP 133/85 | HR 82 | Temp 97.6°F | Ht 70.0 in | Wt 261.6 lb

## 2019-01-31 DIAGNOSIS — I10 Essential (primary) hypertension: Secondary | ICD-10-CM

## 2019-01-31 DIAGNOSIS — Z09 Encounter for follow-up examination after completed treatment for conditions other than malignant neoplasm: Secondary | ICD-10-CM

## 2019-01-31 DIAGNOSIS — F1010 Alcohol abuse, uncomplicated: Secondary | ICD-10-CM

## 2019-01-31 DIAGNOSIS — F17200 Nicotine dependence, unspecified, uncomplicated: Secondary | ICD-10-CM

## 2019-01-31 DIAGNOSIS — G629 Polyneuropathy, unspecified: Secondary | ICD-10-CM

## 2019-01-31 DIAGNOSIS — R42 Dizziness and giddiness: Secondary | ICD-10-CM

## 2019-01-31 DIAGNOSIS — F1410 Cocaine abuse, uncomplicated: Secondary | ICD-10-CM

## 2019-01-31 MED ORDER — LOSARTAN POTASSIUM 25 MG PO TABS: ORAL_TABLET | ORAL | 0 refills | Status: DC

## 2019-01-31 MED ORDER — CLONIDINE HCL 0.2 MG PO TABS: ORAL_TABLET | ORAL | 0 refills | Status: DC

## 2019-01-31 MED ORDER — GABAPENTIN 300 MG PO CAPS: ORAL_CAPSULE | ORAL | 1 refills | Status: DC

## 2019-01-31 NOTE — Patient Instructions (Addendum)
Mental Health/ Substance Use [facility] the [place]  [place]:  [phone] or [phone]  [facility] of Care:  [phone]  [facility]our[facility] Counseling:  [place]:  [place] (walk[place])  [phone] / [address]  [place]:  [phone]  Alcoholics Anonymous:  [phone]  Narcotics Anonymous:  [phone]  Quit Smoking Hotline:  800-QUIT-NOW (972) 274-2360)    Poison Control [phone]  Counseled on blood pressure goal of less than 130/80, low-sodium, DASH diet, medication compliance, 150 minutes of moderate intensity exercise per week. Medication changed clonidine .2mg  for bedtime only causing light headiness and sleepiness Start taking Losartan 25 mg in the morning. (currently has 50mg ) He is aware to take 1/2 of tablet until Bp follow up.

## 2019-01-31 NOTE — Progress Notes (Signed)
Established Patient Office Visit  Subjective:  Patient ID: Daniel [name], male    DOB: 09-10-1960  Age: 58 y.o. MRN: [id number]  CC:  Chief Complaint  Patient presents with  . Follow-up    HTN  . Dizziness    HPI Daniel [name] presents for hospital follow up on 01/22/2019 admitted for acute kidney injury on assessment he was lightheaded and orthostatic. Returned on 01/27/2019 for dizziness. Today his Bp is 133/85 and continues to complain of dizziness. Patient states he is taking all his medication as prescribe since discharge clonidine .63m twice daily gabapentin 300 mg three times and day and aspirn. He is also complaining of drowsiness. States his feet continue to hurt on the gabapentin and want to increase it.  Past Medical History:  Diagnosis Date  . [place] complication [place]   . Hypertension   . Shoulder pain, left 06/28/2013    Past Surgical History:  Procedure Laterality Date  . APPENDECTOMY    . COLONOSCOPY  01/23/2012   Procedure: COLONOSCOPY;  Surgeon: SDanie Binder MD;  Location[facility];  Service: Endoscopy;  Laterality: N/A;  11:10 AM  . INCISION AND DRAINAGE ABSCESS N/A 06/29/2016   Procedure: INCISION AND DRAINAGE ABDOMINAL WALL ABSCESS;  Surgeon: [name] Overall MD;  Location: [facility] ORS;  Service: General;  Laterality: N/A;  . Left heel surgery      Family History  Problem Relation Age of Onset  . Colon cancer Neg Hx     Social History   Socioeconomic History  . Marital status: Single    Spouse name: Not on file  . Number of children: Not on file  . Years of education: Not on file  . Highest education level: Not on file  Occupational History  . Not on file  Social Needs  . Financial resource strain: Not on file  . Food insecurity    Worry: Not on file    Inability: Not on file  . Transportation needs    Medical: Not on file    Non-medical: Not on file  Tobacco Use  . Smoking status: Current Every Day Smoker   Packs/day: 1.00    Years: 30.00    Pack years: 30.00    Types: Cigarettes  . Smokeless tobacco: Never Used  Substance and Sexual Activity  . Alcohol use: Yes    Alcohol/week: 12.0 standard drinks    Types: 12 Cans of beer per week  . Drug use: Yes    Types: "Crack" cocaine  . Sexual activity: Yes    Birth control/protection: None  Lifestyle  . Physical activity    Days per week: Not on file    Minutes per session: Not on file  . Stress: Not on file  Relationships  . Social [place] phone: Not on file    Gets together: Not on file    Attends religious service: Not on file    Active member of club or organization: Not on file    Attends meetings of clubs or organizations: Not on file    Relationship status: Not on file  . Intimate partner violence    Fear of current or ex partner: Not on file    Emotionally abused: Not on file    Physically abused: Not on file    Forced sexual activity: Not on file  Other Topics Concern  . Not on file  Social History Narrative  . Not on file  Outpatient Medications Prior to Visit  Medication Sig Dispense Refill  . cloNIDine (CATAPRES) 0.2 MG tablet Take 1 tablet (0.2 mg total) by mouth 2 (two) times daily. 180 tablet 1  . gabapentin (NEURONTIN) 300 MG capsule Take 1 capsule (300 mg total) by mouth 3 (three) times daily. Patient takes 1 tablet daily and second dose as needed 90 capsule 0  . blood glucose meter kit and supplies KIT Dispense based on patient and insurance preference. Use up to four times daily as directed. (FOR ICD-9 250.00, 250.01). 1 each 0  . ferrous sulfate 325 (65 FE) MG tablet Take 1 tablet (325 mg total) by mouth daily with breakfast. (Patient not taking: Reported on 01/31/2019) 30 tablet 0  . folic acid (FOLVITE) 1 MG tablet Take 1 tablet (1 mg total) by mouth daily. (Patient not taking: Reported on 01/31/2019) 30 tablet 0  . senna (SENOKOT) 8.6 MG TABS tablet Take 1 tablet (8.6 mg total) by mouth daily.  (Patient not taking: Reported on 01/31/2019) 120 tablet 1  . thiamine 100 MG tablet Take 1 tablet (100 mg total) by mouth daily. (Patient not taking: Reported on 01/31/2019) 30 tablet 0  . triamcinolone cream (KENALOG) 0.1 % Apply 1 application topically 2 (two) times daily. (Patient not taking: Reported on 01/24/2019) 30 g 0   No facility-administered medications prior to visit.     No Known Allergies  ROS Review of Systems  Neurological: Positive for dizziness and light-headedness.  All other systems reviewed and are negative.     Objective:    Physical Exam  Constitutional: He is oriented to person, place, and time. He appears well-developed and well-nourished.  HENT:  Head: Normocephalic.  Neck: Normal range of motion. Neck supple.  Cardiovascular: Normal rate and regular rhythm.  Pulmonary/Chest: Effort normal and breath sounds normal.  Abdominal: Soft. Bowel sounds are normal. He exhibits distension.  Neurological: He is oriented to person, place, and time.  Skin: Skin is warm and dry.  Psychiatric: He has a normal mood and affect.    BP 133/85 (BP Location: Left Arm, Patient Position: Sitting, Cuff Size: Large)   Pulse 82   Temp 97.6 F (36.4 C) (Temporal)   Ht 5' 10"  (1.778 m)   Wt 261 lb 9.6 oz (118.7 kg)   SpO2 95%   BMI 37.54 kg/m  Wt Readings from Last 3 Encounters:  01/31/19 261 lb 9.6 oz (118.7 kg)  01/27/19 235 lb (106.6 kg)  01/23/19 249 lb 4.8 oz (113.1 kg)     Health Maintenance Due  Topic Date Due  . FOOT EXAM  11/09/1969  . OPHTHALMOLOGY EXAM  11/09/1969    There are no preventive care reminders to display for this patient.  Lab Results  Component Value Date   TSH 2.833 06/29/2016   Lab Results  Component Value Date   WBC 5.5 01/27/2019   HGB 9.8 (L) 01/27/2019   HCT 28.9 (L) 01/27/2019   MCV 106.6 (H) 01/27/2019   PLT 115 (L) 01/27/2019   Lab Results  Component Value Date   NA 141 01/27/2019   K 4.1 01/27/2019   CO2 20 (L)  01/27/2019   GLUCOSE 149 (H) 01/27/2019   BUN 34 (H) 01/27/2019   CREATININE 3.29 (H) 01/27/2019   BILITOT 0.5 01/27/2019   ALKPHOS 36 (L) 01/27/2019   AST 76 (H) 01/27/2019   ALT 45 (H) 01/27/2019   PROT 7.7 01/27/2019   ALBUMIN 3.0 (L) 01/27/2019   CALCIUM 9.2 01/27/2019  ANIONGAP 10 01/27/2019   Lab Results  Component Value Date   CHOL 140 05/21/2017   Lab Results  Component Value Date   HDL 39 (L) 05/21/2017   Lab Results  Component Value Date   LDLCALC 50 05/21/2017   Lab Results  Component Value Date   TRIG 220 (H) 05/22/2017   Lab Results  Component Value Date   CHOLHDL 3.6 05/21/2017   Lab Results  Component Value Date   HGBA1C 5.8 (A) 11/01/2018      Assessment & Plan  Daniel [name] was seen today for follow-up and dizziness.  Diagnoses and all orders for this visit:  Essential hypertension  [facility] discharge follow-up  Alcohol abuse  Cocaine abuse ([place]) Discussed the effects of increase risk for heart attacks, respiratory problems by causing lung damage, loss of smell and nose bleeds. Inform patient at next appointment will perform UDS. Patient understands risks.  Neuropathy continues to complain of neuropathy increased at night change dosage 328m twice daily and 6070mat bedtime.   Other orders -     cloNIDine (CATAPRES) 0.2 MG tablet; Take  1 tablet at night- after he is off of work -     gabapentin (NEURONTIN) 300 MG capsule; Patient takes 1 tablet twice a day and 2 tablets at night. -     losartan (COZAAR) 25 MG tablet; Take 2541mvery morning after breakfast   Meds ordered this encounter  Medications  . cloNIDine (CATAPRES) 0.2 MG tablet    Sig: Take  1 tablet at night- after he is off of work    Dispense:  30 tablet    Refill:  0  . gabapentin (NEURONTIN) 300 MG capsule    Sig: Patient takes 1 tablet twice a day and 2 tablets at night.    Dispense:  120 capsule    Refill:  1  . losartan (COZAAR) 25 MG tablet    Sig:  Take 40m12mery morning after breakfast    Dispense:  15 tablet    Refill:  0    Follow-up: Return in about 3 weeks (around 02/21/2019) for schedule with CSW for alcohol and drug abuse. and Bp check.    MichKerin Perna

## 2019-02-02 ENCOUNTER — Other Ambulatory Visit (INDEPENDENT_AMBULATORY_CARE_PROVIDER_SITE_OTHER): Payer: Self-pay | Admitting: Primary Care

## 2019-02-02 DIAGNOSIS — K7460 Unspecified cirrhosis of liver: Secondary | ICD-10-CM

## 2019-02-21 ENCOUNTER — Ambulatory Visit (INDEPENDENT_AMBULATORY_CARE_PROVIDER_SITE_OTHER): Payer: Self-pay | Admitting: Primary Care

## 2019-02-22 ENCOUNTER — Ambulatory Visit (INDEPENDENT_AMBULATORY_CARE_PROVIDER_SITE_OTHER): Payer: Self-pay | Admitting: Primary Care

## 2019-02-22 ENCOUNTER — Institutional Professional Consult (permissible substitution) (INDEPENDENT_AMBULATORY_CARE_PROVIDER_SITE_OTHER): Payer: Self-pay | Admitting: Licensed Clinical Social Worker

## 2019-03-29 ENCOUNTER — Encounter (HOSPITAL_COMMUNITY): Payer: Self-pay | Admitting: *Deleted

## 2019-03-29 ENCOUNTER — Inpatient Hospital Stay (HOSPITAL_COMMUNITY)
Admission: EM | Admit: 2019-03-29 | Discharge: 2019-03-31 | DRG: 812 | Disposition: A | Discharge status: Home / Self Care | Payer: Self-pay | Attending: Student in an Organized Health Care Education/Training Program | Admitting: Student in an Organized Health Care Education/Training Program

## 2019-03-29 ENCOUNTER — Emergency Department (HOSPITAL_COMMUNITY): Payer: Self-pay

## 2019-03-29 DIAGNOSIS — F17210 Nicotine dependence, cigarettes, uncomplicated: Secondary | ICD-10-CM | POA: Diagnosis present

## 2019-03-29 DIAGNOSIS — N185 Chronic kidney disease, stage 5: Secondary | ICD-10-CM | POA: Diagnosis present

## 2019-03-29 DIAGNOSIS — R195 Other fecal abnormalities: Secondary | ICD-10-CM | POA: Diagnosis present

## 2019-03-29 DIAGNOSIS — Z20828 Contact with and (suspected) exposure to other viral communicable diseases: Secondary | ICD-10-CM | POA: Diagnosis present

## 2019-03-29 DIAGNOSIS — F1010 Alcohol abuse, uncomplicated: Secondary | ICD-10-CM | POA: Diagnosis present

## 2019-03-29 DIAGNOSIS — E8770 Fluid overload, unspecified: Secondary | ICD-10-CM | POA: Diagnosis present

## 2019-03-29 DIAGNOSIS — D509 Iron deficiency anemia, unspecified: Principal | ICD-10-CM | POA: Diagnosis present

## 2019-03-29 DIAGNOSIS — I129 Hypertensive chronic kidney disease with stage 1 through stage 4 chronic kidney disease, or unspecified chronic kidney disease: Secondary | ICD-10-CM | POA: Diagnosis present

## 2019-03-29 DIAGNOSIS — R0600 Dyspnea, unspecified: Secondary | ICD-10-CM

## 2019-03-29 DIAGNOSIS — D631 Anemia in chronic kidney disease: Secondary | ICD-10-CM | POA: Diagnosis present

## 2019-03-29 DIAGNOSIS — Z23 Encounter for immunization: Secondary | ICD-10-CM

## 2019-03-29 DIAGNOSIS — Z8719 Personal history of other diseases of the digestive system: Secondary | ICD-10-CM

## 2019-03-29 DIAGNOSIS — E1122 Type 2 diabetes mellitus with diabetic chronic kidney disease: Secondary | ICD-10-CM

## 2019-03-29 DIAGNOSIS — Z7289 Other problems related to lifestyle: Secondary | ICD-10-CM

## 2019-03-29 DIAGNOSIS — F1490 Cocaine use, unspecified, uncomplicated: Secondary | ICD-10-CM

## 2019-03-29 DIAGNOSIS — N184 Chronic kidney disease, stage 4 (severe): Secondary | ICD-10-CM | POA: Diagnosis present

## 2019-03-29 DIAGNOSIS — R079 Chest pain, unspecified: Secondary | ICD-10-CM

## 2019-03-29 DIAGNOSIS — D519 Vitamin B12 deficiency anemia, unspecified: Secondary | ICD-10-CM | POA: Diagnosis present

## 2019-03-29 DIAGNOSIS — E538 Deficiency of other specified B group vitamins: Secondary | ICD-10-CM | POA: Diagnosis present

## 2019-03-29 DIAGNOSIS — Z9889 Other specified postprocedural states: Secondary | ICD-10-CM

## 2019-03-29 DIAGNOSIS — Z79899 Other long term (current) drug therapy: Secondary | ICD-10-CM

## 2019-03-29 DIAGNOSIS — E669 Obesity, unspecified: Secondary | ICD-10-CM | POA: Diagnosis present

## 2019-03-29 DIAGNOSIS — F1410 Cocaine abuse, uncomplicated: Secondary | ICD-10-CM | POA: Diagnosis present

## 2019-03-29 DIAGNOSIS — D649 Anemia, unspecified: Secondary | ICD-10-CM | POA: Diagnosis present

## 2019-03-29 DIAGNOSIS — Z6832 Body mass index (BMI) 32.0-32.9, adult: Secondary | ICD-10-CM

## 2019-03-29 DIAGNOSIS — R778 Other specified abnormalities of plasma proteins: Secondary | ICD-10-CM | POA: Diagnosis present

## 2019-03-29 LAB — TROPONIN I (HIGH SENSITIVITY)
Troponin I (High Sensitivity): 27 ng/L — ABNORMAL HIGH (ref <18)
Troponin I (High Sensitivity): 31 ng/L — ABNORMAL HIGH (ref <18)
Troponin I (High Sensitivity): 26 ng/L — ABNORMAL HIGH (ref <18)

## 2019-03-29 LAB — BASIC METABOLIC PANEL
Anion gap: 14 (ref 5–15)
BUN: 27 mg/dL — ABNORMAL HIGH (ref 6–20)
CO2: 17 mmol/L — ABNORMAL LOW (ref 22–32)
Calcium: 8.9 mg/dL (ref 8.9–10.3)
Chloride: 113 mmol/L — ABNORMAL HIGH (ref 98–111)
Creatinine, Ser: 2.8 mg/dL — ABNORMAL HIGH (ref 0.61–1.24)
GFR calc Af Amer: 27 mL/min — ABNORMAL LOW (ref >60)
GFR calc non Af Amer: 24 mL/min — ABNORMAL LOW (ref >60)
Glucose, Bld: 170 mg/dL — ABNORMAL HIGH (ref 70–99)
Potassium: 4.2 mmol/L (ref 3.5–5.1)
Sodium: 144 mmol/L (ref 135–145)

## 2019-03-29 LAB — CBC
HCT: 22.3 % — ABNORMAL LOW (ref 39.0–52.0)
Hemoglobin: 6.9 g/dL — CL (ref 13.0–17.0)
MCH: 33 pg (ref 26.0–34.0)
MCHC: 30.9 g/dL (ref 30.0–36.0)
MCV: 106.7 fL — ABNORMAL HIGH (ref 80.0–100.0)
Platelets: 158 10*3/uL (ref 150–400)
RBC: 2.09 MIL/uL — ABNORMAL LOW (ref 4.22–5.81)
RDW: 15.2 % (ref 11.5–15.5)
WBC: 6 10*3/uL (ref 4.0–10.5)
nRBC: 0.7 % — ABNORMAL HIGH (ref 0.0–0.2)

## 2019-03-29 LAB — FERRITIN: Ferritin: 13 ng/mL — ABNORMAL LOW (ref 24–336)

## 2019-03-29 LAB — IRON AND TIBC
Iron: 40 ug/dL — ABNORMAL LOW (ref 45–182)
Saturation Ratios: 8 % — ABNORMAL LOW (ref 17.9–39.5)
TIBC: 526 ug/dL — ABNORMAL HIGH (ref 250–450)
UIBC: 486 ug/dL

## 2019-03-29 LAB — RAPID URINE DRUG SCREEN, HOSP PERFORMED
Amphetamines: NOT DETECTED
Barbiturates: NOT DETECTED
Benzodiazepines: NOT DETECTED
Cocaine: POSITIVE — AB
Opiates: NOT DETECTED
Tetrahydrocannabinol: NOT DETECTED

## 2019-03-29 LAB — RETICULOCYTES
Immature Retic Fract: 30.9 % — ABNORMAL HIGH (ref 2.3–15.9)
RBC.: 2.2 MIL/uL — ABNORMAL LOW (ref 4.22–5.81)
Retic Count, Absolute: 79 10*3/uL (ref 19.0–186.0)
Retic Ct Pct: 3.6 % — ABNORMAL HIGH (ref 0.4–3.1)

## 2019-03-29 LAB — ABO/RH: ABO/RH(D): B POS

## 2019-03-29 LAB — POC SARS CORONAVIRUS 2 AG -  ED: SARS Coronavirus 2 Ag: NEGATIVE

## 2019-03-29 LAB — PREPARE RBC (CROSSMATCH)

## 2019-03-29 LAB — HEMOGLOBIN AND HEMATOCRIT, BLOOD
HCT: 25.5 % — ABNORMAL LOW (ref 39.0–52.0)
Hemoglobin: 7.8 g/dL — ABNORMAL LOW (ref 13.0–17.0)

## 2019-03-29 LAB — VITAMIN B12: Vitamin B-12: 258 pg/mL (ref 180–914)

## 2019-03-29 LAB — POC OCCULT BLOOD, ED: Fecal Occult Bld: NEGATIVE

## 2019-03-29 LAB — FOLATE: Folate: 10.2 ng/mL (ref >5.9)

## 2019-03-29 MED ORDER — FUROSEMIDE 10 MG/ML IJ SOLN
60.0000 mg | Freq: Once | INTRAMUSCULAR | Status: AC
  Administered 2019-03-29: 60 mg via INTRAVENOUS
  Filled 2019-03-29: qty 6

## 2019-03-29 MED ORDER — IPRATROPIUM-ALBUTEROL 0.5-2.5 (3) MG/3ML IN SOLN: 3.0000 mL | Freq: Four times a day (QID) | RESPIRATORY_TRACT | Status: DC | PRN

## 2019-03-29 MED ORDER — PANTOPRAZOLE SODIUM 40 MG PO TBEC
40.0000 mg | DELAYED_RELEASE_TABLET | Freq: Two times a day (BID) | ORAL | Status: DC
  Administered 2019-03-29 – 2019-03-31 (×4): 40 mg via ORAL
  Filled 2019-03-29 (×4): qty 1

## 2019-03-29 MED ORDER — PANTOPRAZOLE SODIUM 40 MG IV SOLR
40.0000 mg | Freq: Once | INTRAVENOUS | Status: AC
  Administered 2019-03-29: 40 mg via INTRAVENOUS
  Filled 2019-03-29: qty 40

## 2019-03-29 MED ORDER — VITAMIN B-1 100 MG PO TABS
100.0000 mg | ORAL_TABLET | Freq: Every day | ORAL | Status: DC
  Administered 2019-03-29 – 2019-03-31 (×3): 100 mg via ORAL
  Filled 2019-03-29 (×3): qty 1

## 2019-03-29 MED ORDER — CLONIDINE HCL 0.2 MG PO TABS
0.2000 mg | ORAL_TABLET | Freq: Every day | ORAL | Status: DC
  Administered 2019-03-29 – 2019-03-30 (×2): 0.2 mg via ORAL
  Filled 2019-03-29 (×2): qty 1

## 2019-03-29 MED ORDER — SODIUM CHLORIDE 0.9% IV SOLUTION
Freq: Once | INTRAVENOUS | Status: AC
  Administered 2019-03-29: 12:00:00 via INTRAVENOUS

## 2019-03-29 MED ORDER — THIAMINE HCL 100 MG/ML IJ SOLN: 100.0000 mg | Freq: Every day | INTRAMUSCULAR | Status: DC

## 2019-03-29 MED ORDER — ADULT MULTIVITAMIN W/MINERALS CH
1.0000 | ORAL_TABLET | Freq: Every day | ORAL | Status: DC
  Administered 2019-03-29 – 2019-03-31 (×3): 1 via ORAL
  Filled 2019-03-29 (×3): qty 1

## 2019-03-29 MED ORDER — LORAZEPAM 1 MG PO TABS: 1.0000 mg | ORAL_TABLET | ORAL | Status: DC | PRN

## 2019-03-29 MED ORDER — IPRATROPIUM-ALBUTEROL 0.5-2.5 (3) MG/3ML IN SOLN
3.0000 mL | Freq: Once | RESPIRATORY_TRACT | Status: AC
  Administered 2019-03-29: 3 mL via RESPIRATORY_TRACT

## 2019-03-29 MED ORDER — SODIUM CHLORIDE 0.9% FLUSH
3.0000 mL | Freq: Once | INTRAVENOUS | Status: AC
  Administered 2019-03-29: 3 mL via INTRAVENOUS

## 2019-03-29 MED ORDER — LORAZEPAM 2 MG/ML IJ SOLN
1.0000 mg | INTRAMUSCULAR | Status: DC | PRN
  Administered 2019-03-31: 2 mg via INTRAVENOUS
  Filled 2019-03-29: qty 1

## 2019-03-29 MED ORDER — GABAPENTIN 100 MG PO CAPS
200.0000 mg | ORAL_CAPSULE | Freq: Three times a day (TID) | ORAL | Status: DC
  Administered 2019-03-29 – 2019-03-31 (×5): 200 mg via ORAL
  Filled 2019-03-29 (×5): qty 2

## 2019-03-29 MED ORDER — FOLIC ACID 1 MG PO TABS
1.0000 mg | ORAL_TABLET | Freq: Every day | ORAL | Status: DC
  Administered 2019-03-29 – 2019-03-31 (×3): 1 mg via ORAL
  Filled 2019-03-29 (×3): qty 1

## 2019-03-29 NOTE — Progress Notes (Signed)
Paged Dr on call in regards to patients q1 CIWA assessment to change to q6 to meet needs of level of care of this floor.

## 2019-03-29 NOTE — ED Triage Notes (Signed)
To ED for eval of cp and sob that started yesterday while watching TV. States pain is in middle of chest without radiation. States he is winded when walking or going up stairs. No nausea or vomiting. Cough noted.

## 2019-03-29 NOTE — ED Notes (Signed)
Lunch Tray Ordered @ 1411-per Caryl Pina, RN called by Levada Dy

## 2019-03-29 NOTE — ED Provider Notes (Signed)
[facility] EMERGENCY DEPARTMENT Provider Note   CSN: [id number] Arrival date & time: 03/29/19  0603     History   Chief Complaint Chief Complaint  Patient presents with  . Chest Pain  . Cough  . Shortness of Breath    HPI Daniel [name] is a 58 y.o. male history diabetes, hypertension, anemia.  Patient presents today for chest pain and shortness of breath that began yesterday while watching TV.  He describes a central chest pressure constant no aggravating or alleviating factors, nonradiating and moderate in intensity.  Associated with shortness of breath described as difficulty catching a full breath.  Denies similar in the past.  Reports worse with exertion.  Denies fever/chills, headache, neck pain, back pain, cough/hemoptysis, abdominal pain, nausea/vomiting, diarrhea, blood in stool, melena, dysuria/hematuria, fall/injury or any additional concerns.     HPI  Past Medical History:  Diagnosis Date  . Diabetes mellitus without complication ([place])   . Hypertension   . Shoulder pain, left 06/28/2013    Patient Active Problem List   Diagnosis Date Noted  . Symptomatic anemia 03/29/2019  . Orthostatic hypotension 01/22/2019  . AKI (acute kidney injury) ([place])   . Anemia   . Heme positive stool   . ICH (intracerebral hemorrhage) ([place]) 05/20/2017  . Cellulitis of abdominal wall 06/29/2016  . Abscess of abdominal wall 06/29/2016  . Hypertension 06/29/2016  . DM2 (diabetes mellitus, type 2) ([place]) 06/29/2016  . Sepsis ([place]) 06/29/2016    Past Surgical History:  Procedure Laterality Date  . APPENDECTOMY    . COLONOSCOPY  01/23/2012   Procedure: COLONOSCOPY;  Surgeon: Danie Binder, MD;  Location: [facility]ITE;  Service: Endoscopy;  Laterality: N/A;  11:10 AM  . INCISION AND DRAINAGE ABSCESS N/A 06/29/2016   Procedure: INCISION AND DRAINAGE ABDOMINAL WALL ABSCESS;  Surgeon: Alphonsa Overall, MD;  Location: [facility] ORS;  Service: General;  Laterality: N/A;  .  Left heel surgery          Home Medications    Prior to Admission medications   Medication Sig Start Date End Date Taking? Authorizing Provider  blood glucose meter kit and supplies KIT Dispense based on patient and insurance preference. Use up to four times daily as directed. (FOR ICD-9 250.00, 250.01). 07/01/16   Nita Sells, MD  cloNIDine (CATAPRES) 0.2 MG tablet Take  1 tablet at night- after he is off of work 01/31/19   [name], NP  ferrous sulfate 325 (65 FE) MG tablet Take 1 tablet (325 mg total) by mouth daily with breakfast. Patient not taking: Reported on 01/31/2019 01/25/19   Kathrene Alu, MD  folic acid (FOLVITE) 1 MG tablet Take 1 tablet (1 mg total) by mouth daily. Patient not taking: Reported on 01/31/2019 01/25/19   Kathrene Alu, MD  gabapentin (NEURONTIN) 300 MG capsule Patient takes 1 tablet twice a day and 2 tablets at night. 01/31/19   [name], NP  losartan (COZAAR) 25 MG tablet Take 55m every morning after breakfast 01/31/19   EKerin Perna NP  senna (SENOKOT) 8.6 MG TABS tablet Take 1 tablet (8.6 mg total) by mouth daily. Patient not taking: Reported on 01/31/2019 01/17/19   EKerin Perna NP  thiamine 100 MG tablet Take 1 tablet (100 mg total) by mouth daily. Patient not taking: Reported on 01/31/2019 01/25/19   WKathrene Alu MD  triamcinolone cream (KENALOG) 0.1 % Apply 1 application topically 2 (two) times daily. Patient not taking:  Reported on 01/24/2019 11/01/18   [name], NP    Family History Family History  Problem Relation Age of Onset  . Colon cancer Neg Hx     Social History Social History   Tobacco Use  . Smoking status: Current Every Day Smoker    Packs/day: 1.00    Years: 30.00    Pack years: 30.00    Types: Cigarettes  . Smokeless tobacco: Never Used  Substance Use Topics  . Alcohol use: Yes    Alcohol/week: 12.0 standard drinks    Types: 12 Cans of beer per week  . Drug use:  Yes    Types: "Crack" cocaine     Allergies   Patient has no known allergies.   Review of Systems Review of Systems Ten systems are reviewed and are negative for acute change except as noted in the HPI   Physical Exam Updated Vital Signs BP (!) 166/108   Pulse 78   Temp 98.6 F (37 C) (Oral)   Resp 19   Ht 5' 11"  (1.803 m)   Wt 104.3 kg   SpO2 100%   BMI 32.08 kg/m   Physical Exam Constitutional:      General: He is not in acute distress.    Appearance: Normal appearance. He is well-developed. He is not ill-appearing or diaphoretic.  HENT:     Head: Normocephalic and atraumatic.     Right Ear: External ear normal.     Left Ear: External ear normal.     Nose: Nose normal.  Eyes:     General: Vision grossly intact. Gaze aligned appropriately.     Pupils: Pupils are equal, round, and reactive to light.  Neck:     Musculoskeletal: Normal range of motion.     Trachea: Trachea and phonation normal. No tracheal deviation.  Cardiovascular:     Rate and Rhythm: Normal rate and regular rhythm.     Pulses:          Radial pulses are 2+ on the right side and 2+ on the left side.       Dorsalis pedis pulses are 2+ on the right side and 2+ on the left side.     Heart sounds: Normal heart sounds.  Pulmonary:     Effort: Pulmonary effort is normal. No accessory muscle usage or respiratory distress.     Breath sounds: Normal breath sounds.  Chest:     Chest wall: No tenderness.  Abdominal:     General: There is no distension.     Palpations: Abdomen is soft.     Tenderness: There is no abdominal tenderness. There is no guarding or rebound.  Genitourinary:    Comments: Rectal examination chaperoned by Casey Burkitt.  No gross blood on examination.  Light brown stool.  Normal rectal tone.  No external hemorrhoid.  No palpable internal hemorrhoids or fissures. Musculoskeletal: Normal range of motion.     Right lower leg: He exhibits no tenderness. No edema.     Left lower leg: He  exhibits no tenderness. No edema.  Skin:    General: Skin is warm and dry.  Neurological:     Mental Status: He is alert.     GCS: GCS eye subscore is 4. GCS verbal subscore is 5. GCS motor subscore is 6.     Comments: Speech is clear and goal oriented, follows commands Major Cranial nerves without deficit, no facial droop Moves extremities without ataxia, coordination intact  Psychiatric:  Behavior: Behavior normal.    ED Treatments / Results  Labs (all labs ordered are listed, but only abnormal results are displayed) Labs Reviewed  BASIC METABOLIC PANEL - Abnormal; Notable for the following components:      Result Value   Chloride 113 (*)    CO2 17 (*)    Glucose, Bld 170 (*)    BUN 27 (*)    Creatinine, Ser 2.80 (*)    GFR calc non Af Amer 24 (*)    GFR calc Af Amer 27 (*)    All other components within normal limits  CBC - Abnormal; Notable for the following components:   RBC 2.09 (*)    Hemoglobin 6.9 (*)    HCT 22.3 (*)    MCV 106.7 (*)    nRBC 0.7 (*)    All other components within normal limits  IRON AND TIBC - Abnormal; Notable for the following components:   Iron 40 (*)    TIBC 526 (*)    Saturation Ratios 8 (*)    All other components within normal limits  FERRITIN - Abnormal; Notable for the following components:   Ferritin 13 (*)    All other components within normal limits  RETICULOCYTES - Abnormal; Notable for the following components:   Retic Ct Pct 3.6 (*)    RBC. 2.20 (*)    Immature Retic Fract 30.9 (*)    All other components within normal limits  TROPONIN I (HIGH SENSITIVITY) - Abnormal; Notable for the following components:   Troponin I (High Sensitivity) 27 (*)    All other components within normal limits  TROPONIN I (HIGH SENSITIVITY) - Abnormal; Notable for the following components:   Troponin I (High Sensitivity) 31 (*)    All other components within normal limits  VITAMIN B12  FOLATE  POC OCCULT BLOOD, ED  POC SARS CORONAVIRUS 2  AG -  ED  TYPE AND SCREEN  PREPARE RBC (CROSSMATCH)  ABO/RH    EKG EKG Interpretation  Date/Time:  Tuesday March 29 2019 06:13:44 EST Ventricular Rate:  86 PR Interval:  166 QRS Duration: 80 QT Interval:  364 QTC Calculation: 435 R Axis:   -11 Text Interpretation: Normal sinus rhythm Minimal voltage criteria for LVH, may be normal variant ( R in aVL ) Septal infarct , age undetermined Abnormal ECG Interpretation limited secondary to artifact Confirmed by Sherwood Gambler [phone]) on 03/29/2019 8:23:28 AM   Radiology Dg Chest Portable 1 View  Result Date: 03/29/2019 CLINICAL DATA:  Cough and shortness of breath EXAM: PORTABLE CHEST 1 VIEW COMPARISON:  Sep 20, 2018 FINDINGS: Lungs are clear. Heart is mildly enlarged with pulmonary vascularity normal. There is aortic atherosclerosis. No adenopathy. No bone lesions. IMPRESSION: Mild cardiac enlargement. No edema or consolidation. Aortic Atherosclerosis (ICD10-I70.0). Electronically Signed   By: Lowella Grip III M.D.   On: 03/29/2019 08:42    Procedures .Critical Care Performed by: Deliah Boston, PA-C Authorized by: Deliah Boston, PA-C   Critical care provider statement:    Critical care time (minutes):  43   Critical care was necessary to treat or prevent imminent or life-threatening deterioration of the following conditions: Symptomatic Anemia.   Critical care was time spent personally by me on the following activities:  Discussions with consultants, evaluation of patient's response to treatment, examination of patient, ordering and performing treatments and interventions, ordering and review of laboratory studies, ordering and review of radiographic studies, pulse oximetry, re-evaluation of patient's condition, obtaining history from patient  or surrogate, review of old charts and development of treatment plan with patient or surrogate   (including critical care time)  Medications Ordered in ED Medications  sodium  chloride flush (NS) 0.9 % injection 3 mL (has no administration in time range)  0.9 %  sodium chloride infusion (Manually program via Guardrails IV Fluids) (has no administration in time range)     Initial Impression / Assessment and Plan / ED Course  I have reviewed the triage vital signs and the nursing notes.  Pertinent labs & imaging results that were available during my care of the patient were reviewed by me and considered in my medical decision making (see chart for details).  Clinical Course as of Mar 28 1145  Tue Mar 29, 2019  1109 Dr. Shan Levans   [BM]    Clinical Course User Index [BM] Nuala Alpha A, PA-C   COVID-19 negative  Hemoccult negative Type and screen B+ High-sensitivity troponin: 27 BMP creatinine 2.8, BUN 27 appears baseline CBC with hemoglobin 6.9 Chest x-ray:  IMPRESSION:  Mild cardiac enlargement. No edema or consolidation. Aortic  Atherosclerosis (ICD10-I70.0).   EKG: Normal sinus rhythm Minimal voltage criteria for LVH, may be normal variant ( R in aVL ) Septal infarct , age undetermined Abnormal ECG Interpretation limited secondary to artifact Confirmed by Sherwood Gambler [phone]) on 03/29/2019 8:23:28 AM - Patient well-appearing in no acute distress eating chips on initial evaluation.  1 day of chest pain and shortness of breath.  Cranial nerves intact, no meningeal signs, chest nontender to palpation, heart regular rate and rhythm without murmur, lungs clear to auscultation bilaterally, abdomen soft nontender without peritoneal signs, bowel sounds active, neurovascular intact to all 4 extremities without sign of DVT, equal distal pulses bilaterally, Hemoccult negative for blood.  No tachycardia, hypoxia, pleurisy or extremity swelling to suggest pulmonary embolism. Suspect patient to have symptomatic anemia today hemoglobin of 6.9, transfusion ordered, suspect troponin mildly elevated due to demand ischemia.  Delta troponin pending, will admit to  hospitalist.   - On reevaluation patient resting comfortably and in no acute distress states understanding of care plan and is agreeable for admission. - Discussed case with Dr. Shan Levans from teaching service who will be seeing patient for admission.  Patient seen and evaluated by Dr. Regenia Skeeter during this visit.  Note: Portions of this report may have been transcribed using voice recognition software. Every effort was made to ensure accuracy; however, inadvertent computerized transcription errors may still be present. Final Clinical Impressions(s) / ED Diagnoses   Final diagnoses:  Symptomatic anemia    ED Discharge Orders    None       Gari Crown 03/29/19 1147    Sherwood Gambler, MD 04/04/19 2135

## 2019-03-29 NOTE — ED Notes (Signed)
hospitalist at bedside

## 2019-03-29 NOTE — ED Notes (Signed)
Lab called critical value hemoglobin 6.9 reported to Dr Regenia Skeeter and sort/triage nurse patient next to get a room.

## 2019-03-29 NOTE — ED Notes (Signed)
Ordered dinner tray.  

## 2019-03-29 NOTE — H&P (Signed)
Date: 03/29/2019               Patient Name:  Daniel [name] MRN: [id number]  DOB: 11/18/1960 Age / Sex: 58 y.o., male   PCP: [name], NP         Medical Service: Internal Medicine Teaching Service         Attending Physician: Dr. Evette Doffing, Mallie Mussel, *    First Contact: Myrtie Hawk, MD, [place] Pager: [phone])  Second Contact: Darrick Meigs, MD, [place] Pager: [place] [phone])       After Hours (After 5p/  First Contact Pager: [phone]  weekends / holidays): Second Contact Pager: [phone]   Chief Complaint: dyspnea, Foot pain  History of Present Illness: 58 y.o. yo male w/ PMH significant for HTN, CKD 4, Melena, Alcohol use disorder, cocaine use disorder.  Presents with 2-3 days of worsening shortness of breath (feels all began 6 months ago) on exertion and at rest and bilateral foot pain.  He developed a cough around 4-5 days ago and also mentions he had chest pain this morning which brought him in today. He describes the chest pain as a mild pressure which worsens when taking a deep breath and coughing.  It is non radiating and he denies diaphoresis.  Sitting and resting seems to help dyspnea and chest pain.  He denies any similar episodes.  Patient reports he has had dark tarry stools for about one month but denies any abdominal pain.  He denies constipation or diarrhea.  No nausea or vomiting.  He is drinking around one-two 40 oz beer per day.  Initial workup in the ED showed an iron deficiency anemia with negative hemoccult.  Mildly elevated Troponins.      Meds:   Current Facility-Administered Medications:  .  0.9 %  sodium chloride infusion (Manually program via Guardrails IV Fluids), , Intravenous, Once, Nuala Alpha A, PA-C .  sodium chloride flush (NS) 0.9 % injection 3 mL, 3 mL, Intravenous, Once, [place], Corene Cornea, MD  Current Outpatient Medications:  .  blood glucose meter kit and supplies KIT, Dispense based on patient and insurance preference. Use up  to four times daily as directed. (FOR ICD-9 250.00, 250.01)., Disp: 1 each, Rfl: 0 .  cloNIDine (CATAPRES) 0.2 MG tablet, Take  1 tablet at night- after he is off of work, Disp: 30 tablet, Rfl: 0 .  ferrous sulfate 325 (65 FE) MG tablet, Take 1 tablet (325 mg total) by mouth daily with breakfast. (Patient not taking: Reported on 01/31/2019), Disp: 30 tablet, Rfl: 0 .  folic acid (FOLVITE) 1 MG tablet, Take 1 tablet (1 mg total) by mouth daily. (Patient not taking: Reported on 01/31/2019), Disp: 30 tablet, Rfl: 0 .  gabapentin (NEURONTIN) 300 MG capsule, Patient takes 1 tablet twice a day and 2 tablets at night., Disp: 120 capsule, Rfl: 1 .  losartan (COZAAR) 25 MG tablet, Take 76m every morning after breakfast, Disp: 15 tablet, Rfl: 0 .  senna (SENOKOT) 8.6 MG TABS tablet, Take 1 tablet (8.6 mg total) by mouth daily. (Patient not taking: Reported on 01/31/2019), Disp: 120 tablet, Rfl: 1 .  thiamine 100 MG tablet, Take 1 tablet (100 mg total) by mouth daily. (Patient not taking: Reported on 01/31/2019), Disp: 30 tablet, Rfl: 0 .  triamcinolone cream (KENALOG) 0.1 %, Apply 1 application topically 2 (two) times daily. (Patient not taking: Reported on 01/24/2019), Disp: 30 g, Rfl: 0 Past Medical History:  Diagnosis Date  . Diabetes mellitus  without complication ([place])   . Hypertension   . Shoulder pain, left 06/28/2013    Allergies: Allergies as of 03/29/2019  . (No Known Allergies)   Past Medical History:  Diagnosis Date  . Diabetes mellitus without complication ([place])   . Hypertension   . Shoulder pain, left 06/28/2013    Family History:  Family History  Problem Relation Age of Onset  . Colon cancer Neg Hx      Social History:  Social History   Tobacco Use  . Smoking status: Current Every Day Smoker    Packs/day: 1.00    Years: 30.00    Pack years: 30.00    Types: Cigarettes  . Smokeless tobacco: Never Used  Substance Use Topics  . Alcohol use: Yes    Alcohol/week: 12.0 standard  drinks    Types: 12 Cans of beer per week  . Drug use: Yes    Types: "Crack" cocaine     Review of Systems: A complete ROS was negative except as per HPI.   Physical Exam: Blood pressure (!) 170/97, pulse 75, temperature 98.5 F (36.9 C), temperature source Oral, resp. rate 20, height 5' 11"  (1.803 m), weight 104.3 kg, SpO2 99 %. Physical Exam Constitutional:      Appearance: He is obese. He is not diaphoretic.  Eyes:     General: No scleral icterus.       Right eye: No discharge.        Left eye: No discharge.  Neck:     Vascular: JVD present.  Cardiovascular:     Rate and Rhythm: Normal rate and regular rhythm.     Heart sounds: Normal heart sounds. No murmur. No friction rub. No gallop.      Comments: Bilateral Lower extremity edema 1-2+ Pulmonary:     Effort: No respiratory distress.     Breath sounds: Examination of the right-lower field reveals rales. Examination of the left-lower field reveals rales. Wheezing (diffuse) and rales present.  Abdominal:     General: Bowel sounds are normal. There is no distension.     Palpations: Abdomen is soft. There is no mass.     Tenderness: There is no abdominal tenderness. There is no guarding.  Genitourinary:    Rectum: Guaiac result negative.  Neurological:     Mental Status: He is alert.     EKG: personally reviewed my interpretation is SR, likely LVH  CXR: personally reviewed my interpretation is: Difficult to visualize lung bases and costophrenic angles due to body habitus but upper lung fields clear   Assessment & Plan by Problem: Active Problems:   Symptomatic anemia  Iron Deficency Anemia: seems to be more of a chronic GI blood loss based on history and labs. Hgb 6.9 on admission. Likely partially contributing to patient's shortness of breath.  Coag studies normal 2 months ago.   -start pt on PPI therapy -already receiving 1u PRBC's check hgb afterwards -repeat coag studies tomorrow -consider IV iron tomorrow   Dyspnea: Primarily seems to be a pulmonary process as he may have some COPD from cigarette/crack cocaine use.  He does have cough but it is non productive and no other evidence to suggest infection.  Anemia and some mild volume overload from possible diastolic CHF vs CKD likely also contributing.  He is oxygenating normally without supplementation and is not in respiratory distress.  -Will trial some duoneb breathing treatments -lasix 55m x1 -transfusing for anemia  Chest Pain: based on history and initial workup appears atypical  for ACS could be related to pulmonary process.  Another possibility is GI related given melena.    -will repeat one more troponin with post transfusion cbc -repeat ECG -protonix and breathing treatments  Alcohol Use Disorder: Drinking 1-2 40 oz beers daily.    -CIWA protocol -thiamine and folate  CKD4: baseline cr around 3.0 today is 2.80  -continue to monitor  T2DM: seems to have improved with worsening renal function  -monitor CBG for now no need for insulin   Dispo: Admit patient to Inpatient with expected length of stay greater than 2 midnights.  Signed: Katherine Roan, MD 03/29/2019, 12:01 PM

## 2019-03-29 NOTE — Progress Notes (Signed)
NAME:  GRADY MOHABIR, MRN:  [id number], DOB:  1960/06/10, LOS: 0 ADMISSION DATE:  03/29/2019, Primary: [name], NP  CHIEF COMPLAINT:  dyspnea   Brief History  58 yo male with PMH HTN, CKD4, alcohol use disorder, cocaine use disorder, T2DM admitted to the internal medicine teaching service for symptomatic anemia with 4d history of cough, 1d hx chest pain, and 3-4d hx of dypsnea. Hgb 6.9 on admission. Iron panel consistent with iron deficiency anemia. Macrocytic component present with MCV of 107. No significant findings on CXR. ACS ruled out with no significant EKG findings or increase in troponins although they are mildly elevated. UDS + for cocaine.  Per chart review, appears anemia began around May 2020 and has been slowly down trending. Most significant drop occurring since 10/1 when hgb was 9.8.   Last colonoscopy appears to be in 2013 and was significant for mild diverticulosis and moderate internal hemmrhoids. Previous stool occult from 2 mo ago positive however one obtained this admission was negative.   Subjective  Afebrile overnight. Blood pressures improved. Now on RA. Post transfusion h/h 7.8.  Objective   Blood pressure (!) 177/85, pulse 81, temperature 98.4 F (36.9 C), temperature source Oral, resp. rate (!) 22, height 5\' 11"  (1.803 m), weight 104.3 kg, SpO2 97 %.    Examination: GENERAL: in no acute distress CARDIAC: heart RRR. No peripheral edema.  PULMONARY: acyanotic. Lung sounds clear to auscultation. ABDOMEN: soft. Nontender to palpation.  Nondistended.  NEURO: alert and oriented SKIN: no rash or lesions on limited exam  PSYCH: A/Ox3. Normal affect  Significant Diagnostic Tests:  CXR: mild cardiomegaly. No focal disease.  Labs    CBC Latest Ref Rng & Units 03/29/2019 03/29/2019 01/27/2019  WBC 4.0 - 10.5 K/uL - 6.0 5.5  Hemoglobin 13.0 - 17.0 g/dL 7.8(L) 6.9(LL) 9.8(L)  Hematocrit 39.0 - 52.0 % 25.5(L) 22.3(L) 28.9(L)  Platelets 150 - 400 K/uL -  158 115(L)   BMP Latest Ref Rng & Units 03/29/2019 01/27/2019 01/24/2019  Glucose 70 - 99 mg/dL 170(H) 149(H) 108(H)  BUN 6 - 20 mg/dL 27(H) 34(H) 32(H)  Creatinine 0.61 - 1.24 mg/dL 2.80(H) 3.29(H) 2.69(H)  Sodium 135 - 145 mmol/L 144 141 142  Potassium 3.5 - 5.1 mmol/L 4.2 4.1 4.0  Chloride 98 - 111 mmol/L 113(H) 111 116(H)  CO2 22 - 32 mmol/L 17(L) 20(L) 20(L)  Calcium 8.9 - 10.3 mg/dL 8.9 9.2 8.8(L)    Summary  58 yo male admitted to IMTS for symptomatic anemia.  Assessment & Plan:  Active Problems:   Symptomatic anemia  Symptomatic hypoproliferic macrocytic anemia superimposed on iron deficiency.  hgb 6.9 on admission. Transfused 1U PRBCs.  Iron studies suggestive of iron deficiency anemia.Total iron deficit of 2048mg . RPI is 0.99 suggesting hypoproliferation. B12 low normal. Given macrocytosis, this is likely a contributing factor to anemia. His history of alcohol use disorder and CKD are also likely contributors.  Although stool occult is negative, GI source can not be ruled out. Other consideration would be hemolytic process however bilirubin is normal. If this became elevated, I would be more inclined to consider hemolysis.  Chest pain with flat troponins and no EKG findings suggesting ACS. Likely related to anemia. Dyspnea RESOLVED. Now on RA. Likely related to anemia however does have a significant smoking history which, based on physical exam findings, could also suggest a component of COPD although this has not been formally diagnosed. CXR neg for focal disease process. Plan Trend hgb. Transfuse for hgb <  7. Duonebs prn  Alcohol use disorder. Patient reporting consumption of 1-2 40oz beers daily. b12 and folate normal. Plan: CIWA CKD3 stable.  Best practice:  CODE STATUS: full Diet: reg DVT for prophylaxis: lovenox Dispo: likely discharge tomorrow pending hgb stabilization   Mitzi Hansen, MD INTERNAL MEDICINE RESIDENT PGY-1 PAGER #: [phone] 03/30/19 8:18 PM

## 2019-03-30 ENCOUNTER — Encounter (HOSPITAL_COMMUNITY): Payer: Self-pay | Admitting: Student in an Organized Health Care Education/Training Program

## 2019-03-30 DIAGNOSIS — D539 Nutritional anemia, unspecified: Secondary | ICD-10-CM

## 2019-03-30 DIAGNOSIS — N185 Chronic kidney disease, stage 5: Secondary | ICD-10-CM | POA: Diagnosis present

## 2019-03-30 DIAGNOSIS — N183 Chronic kidney disease, stage 3 unspecified: Secondary | ICD-10-CM

## 2019-03-30 DIAGNOSIS — D509 Iron deficiency anemia, unspecified: Secondary | ICD-10-CM | POA: Diagnosis present

## 2019-03-30 DIAGNOSIS — F1010 Alcohol abuse, uncomplicated: Secondary | ICD-10-CM | POA: Diagnosis present

## 2019-03-30 DIAGNOSIS — N184 Chronic kidney disease, stage 4 (severe): Secondary | ICD-10-CM | POA: Diagnosis present

## 2019-03-30 DIAGNOSIS — E538 Deficiency of other specified B group vitamins: Secondary | ICD-10-CM | POA: Diagnosis present

## 2019-03-30 LAB — TYPE AND SCREEN
ABO/RH(D): B POS
Antibody Screen: NEGATIVE
Unit division: 0

## 2019-03-30 LAB — CBC
HCT: 22.5 % — ABNORMAL LOW (ref 39.0–52.0)
Hemoglobin: 7.1 g/dL — ABNORMAL LOW (ref 13.0–17.0)
MCH: 31.6 pg (ref 26.0–34.0)
MCHC: 31.6 g/dL (ref 30.0–36.0)
MCV: 100 fL (ref 80.0–100.0)
Platelets: 147 10*3/uL — ABNORMAL LOW (ref 150–400)
RBC: 2.25 MIL/uL — ABNORMAL LOW (ref 4.22–5.81)
RDW: 17.6 % — ABNORMAL HIGH (ref 11.5–15.5)
WBC: 5.2 10*3/uL (ref 4.0–10.5)
nRBC: 0.6 % — ABNORMAL HIGH (ref 0.0–0.2)

## 2019-03-30 LAB — COMPREHENSIVE METABOLIC PANEL
ALT: 26 U/L (ref 0–44)
AST: 50 U/L — ABNORMAL HIGH (ref 15–41)
Albumin: 2.5 g/dL — ABNORMAL LOW (ref 3.5–5.0)
Alkaline Phosphatase: 28 U/L — ABNORMAL LOW (ref 38–126)
Anion gap: 9 (ref 5–15)
BUN: 27 mg/dL — ABNORMAL HIGH (ref 6–20)
CO2: 21 mmol/L — ABNORMAL LOW (ref 22–32)
Calcium: 8.6 mg/dL — ABNORMAL LOW (ref 8.9–10.3)
Chloride: 109 mmol/L (ref 98–111)
Creatinine, Ser: 2.73 mg/dL — ABNORMAL HIGH (ref 0.61–1.24)
GFR calc Af Amer: 28 mL/min — ABNORMAL LOW (ref >60)
GFR calc non Af Amer: 24 mL/min — ABNORMAL LOW (ref >60)
Glucose, Bld: 119 mg/dL — ABNORMAL HIGH (ref 70–99)
Potassium: 3.8 mmol/L (ref 3.5–5.1)
Sodium: 139 mmol/L (ref 135–145)
Total Bilirubin: 0.5 mg/dL (ref 0.3–1.2)
Total Protein: 7 g/dL (ref 6.5–8.1)

## 2019-03-30 LAB — BPAM RBC
Blood Product Expiration Date: 202012162359
ISSUE DATE / TIME: 202012011125
Unit Type and Rh: 7300

## 2019-03-30 MED ORDER — PNEUMOCOCCAL VAC POLYVALENT 25 MCG/0.5ML IJ INJ
0.5000 mL | INJECTION | INTRAMUSCULAR | Status: AC
  Administered 2019-03-31: 0.5 mL via INTRAMUSCULAR
  Filled 2019-03-30: qty 0.5

## 2019-03-30 MED ORDER — ACETAMINOPHEN 325 MG PO TABS
650.0000 mg | ORAL_TABLET | Freq: Four times a day (QID) | ORAL | Status: DC | PRN
  Administered 2019-03-30: 650 mg via ORAL
  Filled 2019-03-30: qty 2

## 2019-03-30 MED ORDER — SODIUM CHLORIDE 0.9 % IV SOLN
510.0000 mg | Freq: Once | INTRAVENOUS | Status: AC
  Administered 2019-03-30: 510 mg via INTRAVENOUS
  Filled 2019-03-30: qty 17

## 2019-03-30 MED ORDER — LOSARTAN POTASSIUM 25 MG PO TABS
25.0000 mg | ORAL_TABLET | Freq: Every morning | ORAL | Status: DC
  Administered 2019-03-30 – 2019-03-31 (×2): 25 mg via ORAL
  Filled 2019-03-30 (×2): qty 1

## 2019-03-30 MED ORDER — PHENOL 1.4 % MT LIQD
1.0000 | OROMUCOSAL | Status: DC | PRN
  Administered 2019-03-31: 1 via OROMUCOSAL
  Filled 2019-03-30: qty 177

## 2019-03-30 NOTE — Plan of Care (Signed)
  Problem: Education: Goal: Knowledge of General Education information will improve Description Including pain rating scale, medication(s)/side effects and non-pharmacologic comfort measures Outcome: Progressing   

## 2019-03-30 NOTE — Plan of Care (Signed)
  Problem: Education: Goal: Knowledge of General Education information will improve Description: Including pain rating scale, medication(s)/side effects and non-pharmacologic comfort measures Outcome: Progressing   Problem: Health Behavior/Discharge Planning: Goal: Ability to manage health-related needs will improve Outcome: Progressing   Problem: Clinical Measurements: Goal: Ability to maintain clinical measurements within normal limits will improve Outcome: Progressing   Problem: Activity: Goal: Risk for activity intolerance will decrease Outcome: Progressing   Problem: Nutrition: Goal: Adequate nutrition will be maintained Outcome: Progressing   Problem: Elimination: Goal: Will not experience complications related to urinary retention Outcome: Progressing   Problem: Pain Managment: Goal: General experience of comfort will improve Outcome: Progressing   Problem: Safety: Goal: Ability to remain free from injury will improve Outcome: Progressing   Problem: Skin Integrity: Goal: Risk for impaired skin integrity will decrease Outcome: Progressing   

## 2019-03-30 NOTE — TOC Initial Note (Addendum)
Transition of Care [facility]) - Initial/Assessment Note    Patient Details  Name: Daniel [name] MRN: [id number] Date of Birth: Oct 15, 1960  [facility]) CM/SW Contact:    Marilu Favre, RN Phone Number: 03/30/2019, 1:09 PM  Clinical Narrative:                  Confirmed face sheet information with patient. Patient from home with cousin. Active with PCP. Will continue to follow for discharge needs. Pharmacy changed to [facility], pending scripts will see if can assist with [place].  Provided substance abuse resources. Expected Discharge Plan: Home/Self Care     Patient Goals and CMS Choice Patient states their goals for this hospitalization and ongoing recovery are:: to return to home CMS Medicare.gov Compare Post Acute Care list provided to:: Patient Choice offered to / list presented to : NA  Expected Discharge Plan and Services Expected Discharge Plan: Home/Self Care In-house Referral: Financial Counselor Discharge Planning Services: CM Consult Post Acute Care Choice: NA Living arrangements for the past 2 months: Single Family Home                 DME Arranged: N/A         HH Arranged: NA          Prior Living Arrangements/Services Living arrangements for the past 2 months: Single Family Home Lives with:: Relatives(cousin) Patient language and need for interpreter reviewed:: Yes Do you feel safe going back to the place where you live?: Yes      Need for Family Participation in Patient Care: Yes (Comment) Care giver support system in place?: Yes (comment)   Criminal Activity/Legal Involvement Pertinent to Current Situation/Hospitalization: No - Comment as needed  Activities of Daily Living Home Assistive Devices/Equipment: None ADL Screening (condition at time of admission) Patient's cognitive ability adequate to safely complete daily activities?: Yes Is the patient deaf or have difficulty hearing?: No Does the patient have  difficulty seeing, even when wearing glasses/contacts?: No Does the patient have difficulty concentrating, remembering, or making decisions?: No Patient able to express need for assistance with ADLs?: Yes Does the patient have difficulty dressing or bathing?: No Independently performs ADLs?: Yes (appropriate for developmental age) Does the patient have difficulty walking or climbing stairs?: Yes Weakness of Legs: None Weakness of Arms/Hands: None  Permission Sought/Granted   Permission granted to share information with : No              Emotional Assessment Appearance:: Appears stated age Attitude/Demeanor/Rapport: Engaged Affect (typically observed): Accepting Orientation: : Oriented to Place, Oriented to  Time, Oriented to Situation, Oriented to Self Alcohol / Substance Use: Not Applicable Psych Involvement: No (comment)  Admission diagnosis:  Symptomatic anemia [D64.9] Patient Active Problem List   Diagnosis Date Noted  . Iron deficiency anemia 03/30/2019  . CKD (chronic kidney disease) stage 4, GFR 15-29 ml/min (HCC) 03/30/2019  . Alcohol use disorder 03/30/2019  . B12 deficiency 03/30/2019  . Symptomatic anemia 03/29/2019  . Anemia   . Heme positive stool   . Hypertension 06/29/2016  . DM2 (diabetes mellitus, type 2) ([place]) 06/29/2016   PCP:  [name], NP Pharmacy:   [place], [place] - [address] [place] [address] Phone: [phone] Fax: [phone]  [facility] [place], [place] - [address] [place] [address] Phone: [phone] Fax: [phone]  Social Determinants of Health (SDOH) Interventions    Readmission Risk Interventions No flowsheet data found.

## 2019-03-30 NOTE — Progress Notes (Signed)
Patient complains of pain bilateral feet. Perfusion good with cap refill <3 sec and bilateral pedal pulse 2+. Paged Dr and made aware no current pain medication available

## 2019-03-30 NOTE — Plan of Care (Signed)
  Problem: Education: Goal: Knowledge of General Education information will improve Description: Including pain rating scale, medication(s)/side effects and non-pharmacologic comfort measures 03/30/2019 2352 by Nelia Shi, RN Outcome: Progressing 03/30/2019 2351 by Nelia Shi, RN Outcome: Progressing

## 2019-03-31 DIAGNOSIS — D631 Anemia in chronic kidney disease: Secondary | ICD-10-CM

## 2019-03-31 LAB — CBC
HCT: 24.2 % — ABNORMAL LOW (ref 39.0–52.0)
Hemoglobin: 7.6 g/dL — ABNORMAL LOW (ref 13.0–17.0)
MCH: 31 pg (ref 26.0–34.0)
MCHC: 31.4 g/dL (ref 30.0–36.0)
MCV: 98.8 fL (ref 80.0–100.0)
Platelets: 150 10*3/uL (ref 150–400)
RBC: 2.45 MIL/uL — ABNORMAL LOW (ref 4.22–5.81)
RDW: 16.8 % — ABNORMAL HIGH (ref 11.5–15.5)
WBC: 5.8 10*3/uL (ref 4.0–10.5)
nRBC: 0.7 % — ABNORMAL HIGH (ref 0.0–0.2)

## 2019-03-31 LAB — BASIC METABOLIC PANEL
Anion gap: 9 (ref 5–15)
BUN: 27 mg/dL — ABNORMAL HIGH (ref 6–20)
CO2: 23 mmol/L (ref 22–32)
Calcium: 8.9 mg/dL (ref 8.9–10.3)
Chloride: 110 mmol/L (ref 98–111)
Creatinine, Ser: 2.85 mg/dL — ABNORMAL HIGH (ref 0.61–1.24)
GFR calc Af Amer: 27 mL/min — ABNORMAL LOW (ref >60)
GFR calc non Af Amer: 23 mL/min — ABNORMAL LOW (ref >60)
Glucose, Bld: 199 mg/dL — ABNORMAL HIGH (ref 70–99)
Potassium: 4.1 mmol/L (ref 3.5–5.1)
Sodium: 142 mmol/L (ref 135–145)

## 2019-03-31 MED ORDER — PANTOPRAZOLE SODIUM 40 MG PO TBEC: 40.0000 mg | DELAYED_RELEASE_TABLET | Freq: Two times a day (BID) | ORAL | 0 refills | Status: DC

## 2019-03-31 MED ORDER — VITAMIN B-12 1000 MCG PO TABS: 1000.0000 ug | ORAL_TABLET | Freq: Every day | ORAL | 0 refills | Status: DC

## 2019-03-31 MED ORDER — POLYSACCHARIDE IRON COMPLEX 150 MG PO CAPS: 150.0000 mg | ORAL_CAPSULE | Freq: Every day | ORAL | 0 refills | Status: DC

## 2019-03-31 MED ORDER — FERROUS SULFATE 325 (65 FE) MG PO TBEC: 325.0000 mg | DELAYED_RELEASE_TABLET | Freq: Two times a day (BID) | ORAL | 0 refills | Status: DC

## 2019-03-31 MED ORDER — NALTREXONE HCL 50 MG PO TABS: 50.0000 mg | ORAL_TABLET | Freq: Every day | ORAL | 0 refills | Status: DC

## 2019-03-31 MED ORDER — AMLODIPINE BESYLATE 5 MG PO TABS: 5.0000 mg | ORAL_TABLET | Freq: Every day | ORAL | 11 refills | Status: DC

## 2019-03-31 MED FILL — NALTREXONE 50 MG TABLET: 50 | 30 days supply | Qty: 30 | Fill #0

## 2019-03-31 MED FILL — PANTOPRAZOLE SOD DR 40 MG T: 40 | 30 days supply | Qty: 60 | Fill #0

## 2019-03-31 MED FILL — VITAMIN B-12 1000 MCG TABS: 1000 | 30 days supply | Qty: 30 | Fill #0

## 2019-03-31 MED FILL — FERROUS SULFATE 325 MG TAB: 325 (65 FE) | 30 days supply | Qty: 60 | Fill #0

## 2019-03-31 NOTE — Plan of Care (Signed)

## 2019-03-31 NOTE — Progress Notes (Signed)
AVS given and reviewed with pt. Medications delivered to bedside by [facility] pharmacy and discussed with pt. All questions answered to satisfaction. Pt verbalized understanding of information given. Pt to be escorted off the unit with all belongings via wheelchair by volunteer services.

## 2019-03-31 NOTE — Discharge Instructions (Signed)
Thank you for allowing the Internal Medicine team oversee your care during this hospitalization.  The shortness of breath and chest discomfort you had when you came to the emergency department was most likely due to your blood counts being low. This is called symptomatic anemia. Below, I will list several of the reasons why you may have become anemic and what we would like you to do about them. 1. Alcohol use. Alcohol does several different things to your body. One of these things is that it affects your bone marrow and decreases the amount of blood that your bone marrow normally produces. Additionally, it decreases your absorbion of several nutrients including vitamin B12, folate, and iron. These vitamins are important in making your red blood cells. Basically, decreasing or, ideally, stopping your alcohol intake would likely help prevent this from happening again. I am prescribing you a medication called naltrexone that will help decrease your alcohol cravings. I am also recommending you restart several different supplements including B12, thiamine, folate and B12. I am also prescribing you a new type of iron pill that is typically easier on your stomach. 2. Although you did not have any obvious bleeding during your hospitalization, people can slowly loose blood through their stomach and intestines. This can be due to several different reasons. In order to figure this out, I have placed a referral to the stomach doctor who can further evaluate this.  3. Kidney Disease can also cause your blood counts to be low. I have placed a referral to the kidney doctor for them to see if they have anything to offer at this point.  We also noticed that your blood pressure has been fairly elevated during this hospitalization. Chronically elevated high blood pressure can lead to several issues including heart attack and stroke.  I am going to start you on a new blood pressure medicine called amlodipine. Please follow up  closely with your primary care doctor to further evaluate this and the above issues.  The internal medicine team wishes you the best!

## 2019-03-31 NOTE — Progress Notes (Signed)
NAME:  DAVAN NAWABI, MRN:  [id number], DOB:  December 01, 1960, LOS: 2 ADMISSION DATE:  03/29/2019, Primary: [name], NP  CHIEF COMPLAINT:  dyspnea   Brief History  58 yo male with PMH HTN, CKD4, alcohol use disorder, cocaine use disorder, T2DM admitted to the internal medicine teaching service for symptomatic anemia with 4d history of cough, 1d hx chest pain, and 3-4d hx of dypsnea. Hgb 6.9 on admission. Iron panel consistent with iron deficiency anemia. Macrocytic component present with MCV of 107. No significant findings on CXR. ACS ruled out with no significant EKG findings or increase in troponins although they are mildly elevated. UDS + for cocaine.  Per chart review, appears anemia began around May 2020 and has been slowly down trending. Most significant drop occurring since 10/1 when hgb was 9.8.   Last colonoscopy appears to be in 2013 and was significant for mild diverticulosis and moderate internal hemmrhoids. Previous stool occult from 2 mo ago positive however one obtained this admission was negative.   Subjective  Afebrile overnight. Blood pressures up. Still on room air hgb stable  Objective   Blood pressure (!) 199/110, pulse 73, temperature 98.7 F (37.1 C), temperature source Oral, resp. rate 19, height 5\' 11"  (1.803 m), weight 104.3 kg, SpO2 97 %.    Examination: GENERAL: in no acute distress CARDIAC: heart RRR. No peripheral edema.  PULMONARY: acyanotic. Lung sounds clear to auscultation. ABDOMEN: soft. Nontender to palpation.  Nondistended.  NEURO: alert and oriented SKIN: no rash or lesions on limited exam  PSYCH: A/Ox3. Normal affect  Significant Diagnostic Tests:  CXR: mild cardiomegaly. No focal disease.  Labs    CBC Latest Ref Rng & Units 03/31/2019 03/30/2019 03/29/2019  WBC 4.0 - 10.5 K/uL 5.8 5.2 -  Hemoglobin 13.0 - 17.0 g/dL 7.6(L) 7.1(L) 7.8(L)  Hematocrit 39.0 - 52.0 % 24.2(L) 22.5(L) 25.5(L)  Platelets 150 - 400 K/uL 150 147(L) -    BMP Latest Ref Rng & Units 03/30/2019 03/29/2019 01/27/2019  Glucose 70 - 99 mg/dL 119(H) 170(H) 149(H)  BUN 6 - 20 mg/dL 27(H) 27(H) 34(H)  Creatinine 0.61 - 1.24 mg/dL 2.73(H) 2.80(H) 3.29(H)  Sodium 135 - 145 mmol/L 139 144 141  Potassium 3.5 - 5.1 mmol/L 3.8 4.2 4.1  Chloride 98 - 111 mmol/L 109 113(H) 111  CO2 22 - 32 mmol/L 21(L) 17(L) 20(L)  Calcium 8.9 - 10.3 mg/dL 8.6(L) 8.9 9.2    Summary  58 yo male admitted to IMTS for symptomatic anemia.  Assessment & Plan:  Principal Problem:   Symptomatic anemia Active Problems:   Iron deficiency anemia   CKD (chronic kidney disease) stage 4, GFR 15-29 ml/min (HCC)   Alcohol use disorder   B12 deficiency  Symptomatic hypoproliferic macrocytic anemia superimposed on iron deficiency.  hgb 6.9 on admission. Transfused 1U PRBCs. hgb stable this morning. Iron studies suggestive of iron deficiency anemia.Total iron deficit of 2048mg . RPI is 0.99 suggesting hypoproliferation. B12 low normal. Given macrocytosis, this is likely a contributing factor to anemia. His history of alcohol use disorder and CKD are also likely contributors.  Although stool occult is negative, GI source can not be ruled out.   Chest pain. ACS ruled out diagnosically. Likely related to anemia. Dyspnea RESOLVED. Still on RA. Likely related to anemia. Plan hgb remains stable. No sign of active bleeding. Can likely discharge later today with close outpatient follow up. Iron, b12, folate supplementation at discharge  HTN. Blood pressures elevated during hospitalization. Plan: continue losartan and  clonidine. Will add on 5mg  amlodipine. Follow up with PCP.  Alcohol use disorder. Patient reporting consumption of 1-2 40oz beers daily.Plan: CIWA CKD3 stable.  Best practice:  CODE STATUS: full Diet: reg DVT for prophylaxis: lovenox Dispo: discharge today.   Mitzi Hansen, MD INTERNAL MEDICINE RESIDENT PGY-1 PAGER #: [phone] 03/31/19 4:54 AM

## 2019-04-05 NOTE — Discharge Summary (Addendum)
Name: Daniel [name] MRN: [id number] DOB: [birth year] 58 y.o. PCP: [name], NP  Date of Admission: 03/29/2019  6:04 AM Date of Discharge: 03/31/2019 Attending Physician: Dr. Evette Doffing  Discharge Diagnosis: 1. Principal Problem:   Symptomatic anemia Active Problems:   Iron deficiency anemia   CKD (chronic kidney disease) stage 4, GFR 15-29 ml/min (HCC)   Alcohol use disorder   B12 deficiency    Discharge Medications: Allergies as of 03/31/2019   No Known Allergies     Medication List    STOP taking these medications   ferrous sulfate 325 (65 FE) MG tablet Replaced by: ferrous sulfate 325 (65 FE) MG EC tablet   senna 8.6 MG Tabs tablet Commonly known as: SENOKOT   triamcinolone cream 0.1 % Commonly known as: KENALOG     TAKE these medications   amLODipine 5 MG tablet Commonly known as: NORVASC Take 1 tablet (5 mg total) by mouth daily.   blood glucose meter kit and supplies Kit Dispense based on patient and insurance preference. Use up to four times daily as directed. (FOR ICD-9 250.00, 250.01).   cloNIDine 0.2 MG tablet Commonly known as: CATAPRES Take  1 tablet at night- after he is off of work   ferrous sulfate 325 (65 FE) MG EC tablet Take 1 tablet (325 mg total) by mouth 2 (two) times daily for 60 doses. Replaces: ferrous sulfate 325 (65 FE) MG tablet   folic acid 1 MG tablet Commonly known as: FOLVITE Take 1 tablet (1 mg total) by mouth daily.   gabapentin 300 MG capsule Commonly known as: NEURONTIN Patient takes 1 tablet twice a day and 2 tablets at night. What changed:   how much to take  how to take this  when to take this  additional instructions   losartan 25 MG tablet Commonly known as: COZAAR Take 50m every morning after breakfast What changed:   how much to take  how to take this  when to take this  additional instructions   naltrexone 50 MG tablet Commonly known as: DEPADE Take 1 tablet (50 mg total) by  mouth daily.   pantoprazole 40 MG tablet Commonly known as: PROTONIX Take 1 tablet (40 mg total) by mouth 2 (two) times daily.   thiamine 100 MG tablet Take 1 tablet (100 mg total) by mouth daily.   vitamin B-12 1000 MCG tablet Commonly known as: CYANOCOBALAMIN Take 1 tablet (1,000 mcg total) by mouth daily.       Disposition and follow-up:   [name]  At the hospital follow up visit please address:  1.Recommending more anemia work up including evaluation for GI source with routine screening colonoscopy   2. Ensure patient is compliant with Iron, BY80and Folic acid supplement  3. Amlodipine started during this hospitalization. Please reassess BP  4. Conseal pt for alcohol abstinence   2.  Labs / imaging needed at time of follow-up: CBC (And monitor Ferritin and Hb in future visits)  3.  Pending labs/ test needing follow-up: None  Follow-up Appointments: Follow-up Information    EKerin Perna NP Follow up in 5 day(s).   Specialty: Internal Medicine Contact information: [id number].   Specialty: Gastroenterology Why: I placed a referral to their office. If they do not reach out to you within the next week, please contact  them. Contact information: [place] [id number] [phone]       [place] [place] Follow up.   Why: I placed a referral to their office. If they do not reach out to you within the next week, please contact them. Contact information: [place] [address] [phone]           Hospital Course by problem list: 1. Symptomatic anemia: Daniel [name] is a 58 y/o male with PMHx of HTN, CKD4, T2DM, alcohol use, cocaine use disorder presented with 4 day Hx of cough, SOB, chest pain and some cough. He found to have Hb of 6.9 and  admitted for management of symptomatic anemia. He received a unit of PRBC and Hb remained stable at ~ 7.5 after that. His anemia work up was suggestive of IDA with some B12 def component (his B12 level was low normal, but has macrocytic anemia with MCV 107). He received Feraheme in hospital and prescribed Ferrous Sulfate, Folate (with Hx of Alcohol use) and B12 to be taken after discharge. His CKD3 also plays a role in his anemia. His FOBT was negative but we recommend him to follow up with PCP for routine screening.  Chest pain and SOB: Pt reports 4 day Hx of chest pain and SOB. His Tropining was mildly elevated but no acute EKG changes. Not suggestive of ACS. His symptoms were resolved after transfusion and were likley due to symptomatic anemia.  No further intervention performed. He also has positive Cocaine at UDS that could be contributing with his chest pain.  HTN: Patient was on Clonidine and Losartan prior to admission. His BP was elevated at around 170s/80s on current regimen during this hospitalization and Amlodipine 5 mg QD added to his regimen and recommended to f/u with PCP.   Discharge Vitals:   BP (!) 196/117 (BP Location: Left Arm) Comment: Nurse notified  Pulse 75   Temp (!) 97.5 F (36.4 C) (Oral)   Resp 20   Ht _0  (1.803 m)   Wt 104.3 kg   SpO2 98%   BMI 32.08 kg/m   Pertinent Labs, Studies, and Procedures:    Ref Range & Units 7d ago (03/29/19) 7d ago (03/29/19) 7d ago (03/29/19)  Troponin I (High Sensitivity) <18 ng/L 26High   31High  CM  27High     Ref Range & Units 6d ago (03/30/19) 7d ago (03/29/19) 7d ago (03/29/19)  WBC 4.0 - 10.5 K/uL 5.2   6.0   RBC 4.22 - 5.81 MIL/uL 2.25Low    2.09Low    Hemoglobin 13.0 - 17.0 g/dL 7.1Low   7.8Low   6.9Low Panic  CM   HCT 39.0 - 52.0 % 22.5Low   25.5Low  CM  22.3Low    MCV 80.0 - 100.0 fL 100.0   106.7High    MCH 26.0 - 34.0 pg 31.6   33.0   MCHC 30.0 - 36.0 g/dL 31.6   30.9   RDW 11.5 - 15.5 % 17.6High    15.2    Platelets 150 - 400 K/uL 147Low    158   nRBC 0.0 - 0.2 % 0.6High    0.7High  CM  Frerritin    13 Folate    10.2 B12        258  Ref Range & Units 7d ago  Retic Ct Pct 0.4 - 3.1 % 3.6High    RBC. 4.22 - 5.81 MIL/uL 2.20Low    Retic Count, Absolute 19.0 - 186.0  K/uL 79.0   Immature Retic Fract 2.3 - 15.9 % 30.9High       Ref Range & Units 7d ago  Iron 45 - 182 ug/dL 40Low    TIBC 250 - 450 ug/dL 526High    Saturation Ratios 17.9 - 39.5 % 8Low     Discharge Instructions: Please take iron supplements, B12 and folate supplement as instructed. We also prescribed you a new medication for your blood pressure. Please take 1 tablet of Amlodipine 5 mg every day. Take rest of your medications as before. Discharge Instructions    Ambulatory referral to Gastroenterology   Complete by: As directed    Iron deficiency anemia. Evaluate for need for colonoscopy and EGD. Last colonoscopy 2013.   Ambulatory referral to Nephrology   Complete by: As directed    Anemia. CKD.   Diet - low sodium heart healthy   Complete by: As directed    Increase activity slowly   Complete by: As directed       Signed: Dewayne Hatch, MD 04/05/2019, 6:19 PM   Pager: [phone]

## 2019-04-22 ENCOUNTER — Emergency Department (HOSPITAL_COMMUNITY): Payer: Self-pay

## 2019-04-22 ENCOUNTER — Emergency Department (HOSPITAL_COMMUNITY)
Admission: EM | Admit: 2019-04-22 | Discharge: 2019-04-22 | Disposition: A | Discharge status: Home / Self Care | Payer: Self-pay | Attending: Emergency Medicine | Admitting: Emergency Medicine

## 2019-04-22 DIAGNOSIS — I129 Hypertensive chronic kidney disease with stage 1 through stage 4 chronic kidney disease, or unspecified chronic kidney disease: Secondary | ICD-10-CM | POA: Insufficient documentation

## 2019-04-22 DIAGNOSIS — Z79899 Other long term (current) drug therapy: Secondary | ICD-10-CM | POA: Insufficient documentation

## 2019-04-22 DIAGNOSIS — M79605 Pain in left leg: Secondary | ICD-10-CM | POA: Insufficient documentation

## 2019-04-22 DIAGNOSIS — F17210 Nicotine dependence, cigarettes, uncomplicated: Secondary | ICD-10-CM | POA: Insufficient documentation

## 2019-04-22 DIAGNOSIS — N184 Chronic kidney disease, stage 4 (severe): Secondary | ICD-10-CM | POA: Insufficient documentation

## 2019-04-22 DIAGNOSIS — E1122 Type 2 diabetes mellitus with diabetic chronic kidney disease: Secondary | ICD-10-CM | POA: Insufficient documentation

## 2019-04-22 MED ORDER — CYCLOBENZAPRINE HCL 10 MG PO TABS: 10.0000 mg | ORAL_TABLET | Freq: Two times a day (BID) | ORAL | 0 refills | Status: DC | PRN

## 2019-04-22 MED ORDER — ACETAMINOPHEN 325 MG PO TABS
650.0000 mg | ORAL_TABLET | Freq: Once | ORAL | Status: AC
  Administered 2019-04-22: 650 mg via ORAL
  Filled 2019-04-22: qty 2

## 2019-04-22 MED ORDER — LIDOCAINE 5 % EX PTCH
1.0000 | MEDICATED_PATCH | Freq: Once | CUTANEOUS | Status: DC
  Administered 2019-04-22: 1 via TRANSDERMAL
  Filled 2019-04-22: qty 1

## 2019-04-22 NOTE — ED Provider Notes (Signed)
[name] Provider Note   CSN: [id number] Arrival date & time: 04/22/19  0831     History Chief Complaint  Patient presents with  . Hip Pain  . feet pain    Daniel [name] is a 58 y.o. male has medical history significant for diabetes, hypertension, ICH in 2019 presents emergency department today with chief complaint of left leg and bilateral feet pain.  He states the left leg pain started yesterday.  Pain is located under his left buttock.  The pain is worse with movement.  He has no pain at rest.  He describes the pain is history of similar pain.  He states he has had bilateral feet pain for >1 month.  Pain is located on the bottom of his feet.  He states he feels like they are swollen.  Denies any injury, laceration, or wound.  He has not take any medications for symptoms prior to arrival.  He denies any fall or recent injury.  Denies fever, chills, polydipsia, polyuria, weakness, numbness, tingling.     Past Medical History:  Diagnosis Date  . Diabetes mellitus without complication ([place])   . Hypertension   . ICH (intracerebral hemorrhage) ([place]) 05/20/2017  . Shoulder pain, left 06/28/2013    Patient Active Problem List   Diagnosis Date Noted  . Iron deficiency anemia 03/30/2019  . CKD (chronic kidney disease) stage 4, GFR 15-29 ml/min ([facility]) 03/30/2019  . Alcohol use disorder 03/30/2019  . B12 deficiency 03/30/2019  . Symptomatic anemia 03/29/2019  . Anemia   . Heme positive stool   . Hypertension 06/29/2016  . DM2 (diabetes mellitus, type 2) ([place]) 06/29/2016    Past Surgical History:  Procedure Laterality Date  . APPENDECTOMY    . COLONOSCOPY  01/23/2012   Procedure: COLONOSCOPY;  Surgeon: Danie Binder, MD;  Location: [facility];  Service: Endoscopy;  Laterality: N/A;  11:10 AM  . INCISION AND DRAINAGE ABSCESS N/A 06/29/2016   Procedure: INCISION AND DRAINAGE ABDOMINAL WALL ABSCESS;  Surgeon: Alphonsa Overall, MD;   Location: [facility];  Service: General;  Laterality: N/A;  . Left heel surgery         Family History  Problem Relation Age of Onset  . Colon cancer Neg Hx     Social History   Tobacco Use  . Smoking status: Current Every Day Smoker    Packs/day: 1.00    Years: 30.00    Pack years: 30.00    Types: Cigarettes  . Smokeless tobacco: Never Used  Substance Use Topics  . Alcohol use: Yes    Alcohol/week: 12.0 standard drinks    Types: 12 Cans of beer per week  . Drug use: Yes    Types: "Crack" cocaine    Home Medications Prior to Admission medications   Medication Sig Start Date End Date Taking? Authorizing Provider  amLODipine (NORVASC) 5 MG tablet Take 1 tablet (5 mg total) by mouth daily. 03/31/19 03/30/20  Mitzi Hansen, MD  blood glucose meter kit and supplies KIT Dispense based on patient and insurance preference. Use up to four times daily as directed. (FOR ICD-9 250.00, 250.01). 07/01/16   Nita Sells, MD  cloNIDine (CATAPRES) 0.2 MG tablet Take  1 tablet at night- after he is off of work 01/31/19   [name], NP  cyclobenzaprine (FLEXERIL) 10 MG tablet Take 1 tablet (10 mg total) by mouth 2 (two) times daily as needed for muscle spasms. 04/22/19   Albrizze, Harley Hallmark,  PA-C  ferrous sulfate 325 (65 FE) MG EC tablet Take 1 tablet (325 mg total) by mouth 2 (two) times daily for 60 doses. 03/31/19 04/30/19  Mitzi Hansen, MD  folic acid (FOLVITE) 1 MG tablet Take 1 tablet (1 mg total) by mouth daily. Patient not taking: Reported on 01/31/2019 01/25/19   Kathrene Alu, MD  gabapentin (NEURONTIN) 300 MG capsule Patient takes 1 tablet twice a day and 2 tablets at night. Patient taking differently: Take 300 mg by mouth 4 (four) times daily.  01/31/19   [name], NP  losartan (COZAAR) 25 MG tablet Take 59m every morning after breakfast Patient taking differently: Take 25 mg by mouth every morning.  01/31/19   EKerin Perna NP  naltrexone (DEPADE)  50 MG tablet Take 1 tablet (50 mg total) by mouth daily. 03/31/19   CMitzi Hansen MD  pantoprazole (PROTONIX) 40 MG tablet Take 1 tablet (40 mg total) by mouth 2 (two) times daily. 03/31/19   CMitzi Hansen MD  thiamine 100 MG tablet Take 1 tablet (100 mg total) by mouth daily. Patient not taking: Reported on 01/31/2019 01/25/19   WKathrene Alu MD  vitamin B-12 (CYANOCOBALAMIN) 1000 MCG tablet Take 1 tablet (1,000 mcg total) by mouth daily. 03/31/19   CMitzi Hansen MD    Allergies    Patient has no known allergies.  Review of Systems   Review of Systems  All other systems are reviewed and are negative for acute change except as noted in the HPI.   Physical Exam Updated Vital Signs BP (!) 165/96   Pulse 75   Temp 98.5 F (36.9 C) (Oral)   Resp 16   SpO2 97%   Physical Exam Vitals and nursing note reviewed.  Constitutional:      General: He is not in acute distress.    Appearance: He is not ill-appearing.  HENT:     Head: Normocephalic and atraumatic.     Right Ear: Tympanic membrane and external ear normal.     Left Ear: Tympanic membrane and external ear normal.     Nose: Nose normal.     Mouth/Throat:     Mouth: Mucous membranes are moist.     Pharynx: Oropharynx is clear.  Eyes:     General: No scleral icterus.       Right eye: No discharge.        Left eye: No discharge.     Extraocular Movements: Extraocular movements intact.     Conjunctiva/sclera: Conjunctivae normal.     Pupils: Pupils are equal, round, and reactive to light.  Neck:     Vascular: No JVD.  Cardiovascular:     Rate and Rhythm: Normal rate and regular rhythm.     Pulses: Normal pulses.          Radial pulses are 2+ on the right side and 2+ on the left side.     Heart sounds: Normal heart sounds.  Pulmonary:     Comments: Lungs clear to auscultation in all fields. Symmetric chest rise. No wheezing, rales, or rhonchi. Abdominal:     Comments: Abdomen is soft, non-distended, and  non-tender in all quadrants. No rigidity, no guarding. No peritoneal signs.  Musculoskeletal:        General: Normal range of motion.     Cervical back: Normal range of motion.     Comments: Pelvis is stable.  Patient ambulates with steady gait. No tenderness to palpation of left hip.  No  erythema, warmth, or edema. + Tender to palpation on posterior left thigh underneath left buttock.  No overlying skin changes, no wounds, no swelling. Full ROM of bilateral knees and ankles  Feet:     Right foot:     Toenail Condition: Right toenails are abnormally thick.     Left foot:     Toenail Condition: Left toenails are abnormally thick.     Comments: No swelling noted to feet. Able to wiggle all toes. No wound or laceration. No overlying erythema, edema or skin changes.  DP pulses 2+ bilaterally. Skin:    General: Skin is warm and dry.     Capillary Refill: Capillary refill takes less than 2 seconds.  Neurological:     Mental Status: He is oriented to person, place, and time.     GCS: GCS eye subscore is 4. GCS verbal subscore is 5. GCS motor subscore is 6.     Comments: Fluent speech, no facial droop.  Psychiatric:        Behavior: Behavior normal.       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 There should be no sentence thereafter. Electronically Signed   By: Evangeline Dakin M.D.   On: 04/22/2019 13:17   Result Date: 04/22/2019 CLINICAL DATA:  58 year old who awoke this morning with acute severe LEFT hip pain. No known injury. EXAM: DG HIP (WITH OR WITHOUT PELVIS) 2-3V LEFT COMPARISON:  None. FINDINGS: No evidence of acute, subacute or  healed fractures. Well-preserved joint space. Well-preserved bone mineral density. Mild narrowing of the joint space in the contralateral RIGHT hip. Sacroiliac joints and symphysis pubis anatomically aligned without significant degenerative changes. Degenerative changes involving the visualized lower lumbar spine. IMPRESSION: 1. No acute or subacute osseous abnormality. 2. 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  This is a Electronically Signed: By: Evangeline Dakin M.D. On: 04/22/2019 12:49    Procedures Procedures (including critical care time)  Medications Ordered in ED Medications  acetaminophen (TYLENOL) tablet 650 mg (650 mg Oral Given 04/22/19 1312)    ED Course  I have reviewed the triage vital signs and the nursing notes.  Pertinent labs & imaging results that were available during my care of the patient were reviewed by me and considered in my medical decision making (see chart for details).    MDM Rules/Calculators/A&P                     Patient seen and examined.  He is afebrile, vital signs within normal range.  On my exam he is very well-appearing, no acute distress.  He is moving all extremities without signs of injury.  Pelvis is stable.  He has no tenderness to left hip.  He has tenderness to palpation of your left thigh underneath his left buttock.  No overlying skin changes, no swelling, no wound.  Full range of motion of bilateral lower extremities.  Very low suspicion for septic hip joint.  No signs of injury to bilateral feet.  No signs of cellulitis or wound.  Patient states he is on gabapentin but is not helping his pain enough.  Suspect  that his pain continues to be his diabetic neuropathy. X-ray viewed by me is negative for any acute fracture dislocation.  The radiologist does comment on mild osteoarthritis in the right hip.  In the impression is sentences incomplete stating "this is a."  I called and spoke with radiologist to confirm there was not anything  missing from the impression and he states it must have been a typo and "this is a" was not meant to be included.  Patient's pain is consistent with a musculoskeletal strain.  Given Lidoderm patch and Tylenol for pain.  Given his reassuring exam and imaging feel that he is stable to be discharged home with symptomatic treatment.  Patient given prescription for muscle relaxer and advised not to work or drive after taking as it can cause drowsiness.  The patient appears reasonably screened and/or stabilized for discharge and I doubt any other medical condition or other [facility] requiring further screening, evaluation, or treatment in the ED at this time prior to discharge. The patient is safe for discharge with strict return precautions discussed. Recommend close pcp follow up for symptom recheck.    Portions of this note were generated with [employer]. Dictation errors may occur despite best attempts at proofreading.    Final Clinical Impression(s) / ED Diagnoses Final diagnoses:  Left leg pain    Rx / DC Orders ED Discharge Orders         Ordered    cyclobenzaprine (FLEXERIL) 10 MG tablet  2 times daily PRN     04/22/19 1315           Flint Melter 04/22/19 1644    Dorie Rank, MD 04/22/19 2128

## 2019-04-22 NOTE — ED Triage Notes (Signed)
C/o left hip pain started 2 days ago, also has feet pain-- "for a long time"

## 2019-04-22 NOTE — ED Notes (Signed)
Patient verbalizes understanding of discharge instructions. Opportunity for questioning and answers were provided. Armband removed by staff, pt discharged from ED.  

## 2019-04-22 NOTE — Discharge Instructions (Addendum)
You have been seen today for leg pain. Please read and follow all provided instructions. Return to the emergency room for worsening condition or new concerning symptoms.    1. Medications:  Prescription sent to your pharmacy for flexeril. This is a muscle relaxer.  This medication can make you drowsy.  You cannot take it if you are going to be working or driving. -Also recommend you take ibuprofen for the pain to help with inflammation.  You can also take Tylenol.  Take as directed on the bottle.  Continue usual home medications Take medications as prescribed. Please review all of the medicines and only take them if you do not have an allergy to them.   2. Treatment: rest, drink plenty of fluids.  You can ice or heat the area that hurts if it helps.  3. Follow Up:  Please follow up with primary care provider by scheduling an appointment as soon as possible for a visit  The pain in your feet is likely caused by your diabetes and you need to talk about this with your primary care provider.   It is also a possibility that you have an allergic reaction to any of the medicines that you have been prescribed - Everybody reacts differently to medications and while MOST people have no trouble with most medicines, you may have a reaction such as nausea, vomiting, rash, swelling, shortness of breath. If this is the case, please stop taking the medicine immediately and contact your physician.  ?

## 2019-04-22 NOTE — ED Notes (Signed)
Pt has been taking gabapentin for foot pain with no relief. Pain is worse at night. Pt reports bilat swelling under toes.

## 2019-05-09 ENCOUNTER — Encounter: Payer: Self-pay | Admitting: Student in an Organized Health Care Education/Training Program

## 2019-06-23 ENCOUNTER — Other Ambulatory Visit: Payer: Self-pay

## 2019-06-23 ENCOUNTER — Ambulatory Visit (INDEPENDENT_AMBULATORY_CARE_PROVIDER_SITE_OTHER): Payer: Self-pay | Admitting: Primary Care

## 2019-06-23 VITALS — BP 130/76 | HR 71 | Temp 97.2°F | Ht 71.0 in | Wt 257.0 lb

## 2019-06-23 DIAGNOSIS — G629 Polyneuropathy, unspecified: Secondary | ICD-10-CM

## 2019-06-23 DIAGNOSIS — Z760 Encounter for issue of repeat prescription: Secondary | ICD-10-CM

## 2019-06-23 DIAGNOSIS — I10 Essential (primary) hypertension: Secondary | ICD-10-CM

## 2019-06-23 DIAGNOSIS — R7303 Prediabetes: Secondary | ICD-10-CM

## 2019-06-23 LAB — POCT GLYCOSYLATED HEMOGLOBIN (HGB A1C): Hemoglobin A1C: 5.2 % (ref 4.0–5.6)

## 2019-06-23 MED ORDER — GABAPENTIN 300 MG PO CAPS: ORAL_CAPSULE | ORAL | 1 refills | Status: DC

## 2019-06-23 MED ORDER — CLONIDINE HCL 0.2 MG PO TABS: ORAL_TABLET | ORAL | 1 refills | Status: DC

## 2019-06-23 MED ORDER — AMLODIPINE BESYLATE 10 MG PO TABS: 10.0000 mg | ORAL_TABLET | Freq: Every day | ORAL | 1 refills | Status: DC

## 2019-06-23 MED ORDER — LOSARTAN POTASSIUM 25 MG PO TABS: 25.0000 mg | ORAL_TABLET | Freq: Every morning | ORAL | 1 refills | Status: DC

## 2019-06-23 NOTE — Patient Instructions (Signed)
Neuropathic Pain Neuropathic pain is pain caused by damage to the nerves that are responsible for certain sensations in your body (sensory nerves). The pain can be caused by:  Damage to the sensory nerves that send signals to your spinal cord and brain (peripheral nervous system).  Damage to the sensory nerves in your brain or spinal cord (central nervous system). Neuropathic pain can make you more sensitive to pain. Even a minor sensation can feel very painful. This is usually a long-term condition that can be difficult to treat. The type of pain differs from person to person. It may:  Start suddenly (acute), or it may develop slowly and last for a long time (chronic).  Come and go as damaged nerves heal, or it may stay at the same level for years.  Cause emotional distress, loss of sleep, and a lower quality of life. What are the causes? The most common cause of this condition is diabetes. Many other diseases and conditions can also cause neuropathic pain. Causes of neuropathic pain can be classified as:  Toxic. This is caused by medicines and chemicals. The most common cause of toxic neuropathic pain is damage from cancer treatments (chemotherapy).  Metabolic. This can be caused by: ? Diabetes. This is the most common disease that damages the nerves. ? Lack of vitamin B from long-term alcohol abuse.  Traumatic. Any injury that cuts, crushes, or stretches a nerve can cause damage and pain. A common example is feeling pain after losing an arm or leg (phantom limb pain).  Compression-related. If a sensory nerve gets trapped or compressed for a long period of time, the blood supply to the nerve can be cut off.  Vascular. Many blood vessel diseases can cause neuropathic pain by decreasing blood supply and oxygen to nerves.  Autoimmune. This type of pain results from diseases in which the body's defense system (immune system) mistakenly attacks sensory nerves. Examples of autoimmune diseases  that can cause neuropathic pain include lupus and multiple sclerosis.  Infectious. Many types of viral infections can damage sensory nerves and cause pain. Shingles infection is a common cause of this type of pain.  Inherited. Neuropathic pain can be a symptom of many diseases that are passed down through families (genetic). What increases the risk? You are more likely to develop this condition if:  You have diabetes.  You smoke.  You drink too much alcohol.  You are taking certain medicines, including medicines that kill cancer cells (chemotherapy) or that treat immune system disorders. What are the signs or symptoms? The main symptom is pain. Neuropathic pain is often described as:  Burning.  Shock-like.  Stinging.  Hot or cold.  Itching. How is this diagnosed? No single test can diagnose neuropathic pain. It is diagnosed based on:  Physical exam and your symptoms. Your health care provider will ask you about your pain. You may be asked to use a pain scale to describe how bad your pain is.  Tests. These may be done to see if you have a high sensitivity to pain and to help find the cause and location of any sensory nerve damage. They include: ? Nerve conduction studies to test how well nerve signals travel through your sensory nerves (electrodiagnostic testing). ? Stimulating your sensory nerves through electrodes on your skin and measuring the response in your spinal cord and brain (somatosensory evoked potential).  Imaging studies, such as: ? X-rays. ? CT scan. ? MRI. How is this treated? Treatment for neuropathic pain may change   over time. You may need to try different treatment options or a combination of treatments. Some options include:  Treating the underlying cause of the neuropathy, such as diabetes, kidney disease, or vitamin deficiencies.  Stopping medicines that can cause neuropathy, such as chemotherapy.  Medicine to relieve pain. Medicines may  include: ? Prescription or over-the-counter pain medicine. ? Anti-seizure medicine. ? Antidepressant medicines. ? Pain-relieving patches that are applied to painful areas of skin. ? A medicine to numb the area (local anesthetic), which can be injected as a nerve block.  Transcutaneous nerve stimulation. This uses electrical currents to block painful nerve signals. The treatment is painless.  Alternative treatments, such as: ? Acupuncture. ? Meditation. ? Massage. ? Physical therapy. ? Pain management programs. ? Counseling. Follow these instructions at home: Medicines   Take over-the-counter and prescription medicines only as told by your health care provider.  Do not drive or use heavy machinery while taking prescription pain medicine.  If you are taking prescription pain medicine, take actions to prevent or treat constipation. Your health care provider may recommend that you: ? Drink enough fluid to keep your urine pale yellow. ? Eat foods that are high in fiber, such as fresh fruits and vegetables, whole grains, and beans. ? Limit foods that are high in fat and processed sugars, such as fried or sweet foods. ? Take an over-the-counter or prescription medicine for constipation. Lifestyle   Have a good support system at home.  Consider joining a chronic pain support group.  Do not use any products that contain nicotine or tobacco, such as cigarettes and e-cigarettes. If you need help quitting, ask your health care provider.  Do not drink alcohol. General instructions  Learn as much as you can about your condition.  Work closely with all your health care providers to find the treatment plan that works best for you.  Ask your health care provider what activities are safe for you.  Keep all follow-up visits as told by your health care provider. This is important. Contact a health care provider if:  Your pain treatments are not working.  You are having side effects  from your medicines.  You are struggling with tiredness (fatigue), mood changes, depression, or anxiety. Summary  Neuropathic pain is pain caused by damage to the nerves that are responsible for certain sensations in your body (sensory nerves).  Neuropathic pain may come and go as damaged nerves heal, or it may stay at the same level for years.  Neuropathic pain is usually a long-term condition that can be difficult to treat. Consider joining a chronic pain support group. This information is not intended to replace advice given to you by your health care provider. Make sure you discuss any questions you have with your health care provider. Document Revised: 08/05/2018 Document Reviewed: 05/01/2017 [employer] Patient Education  2020 [employer]  

## 2019-06-23 NOTE — Progress Notes (Signed)
Established Patient Office Visit  Subjective:  Patient ID: Daniel [name], male    DOB: 03-31-1961  Age: 59 y.o. MRN: [id number]  CC:  Chief Complaint  Patient presents with  . Anemia  . Hypertension    HPI Daniel [name] presents for hypertension management of blood pressure. He denies shortness of breath, headaches, chest pain or lower extremity edema. Prediabetes managed with diet and exercising he voices that his feet continue to burn and feel like needles and pins. There are  No other problems or concerns.  Past Medical History:  Diagnosis Date  . Diabetes mellitus without complication ([place])   . Hypertension   . ICH (intracerebral hemorrhage) ([place]) 05/20/2017  . Shoulder pain, left 06/28/2013    Past Surgical History:  Procedure Laterality Date  . APPENDECTOMY    . COLONOSCOPY  01/23/2012   Procedure: COLONOSCOPY;  Surgeon: Danie Binder, MD;  Location: [facility];  Service: Endoscopy;  Laterality: N/A;  11:10 AM  . INCISION AND DRAINAGE ABSCESS N/A 06/29/2016   Procedure: INCISION AND DRAINAGE ABDOMINAL WALL ABSCESS;  Surgeon: Alphonsa Overall, MD;  Location: [facility];  Service: General;  Laterality: N/A;  . Left heel surgery      Family History  Problem Relation Age of Onset  . Colon cancer Neg Hx     Social History   Socioeconomic History  . Marital status: Single    Spouse name: Not on file  . Number of children: Not on file  . Years of education: Not on file  . Highest education level: Not on file  Occupational History  . Not on file  Tobacco Use  . Smoking status: Current Every Day Smoker    Packs/day: 1.00    Years: 30.00    Pack years: 30.00    Types: Cigarettes  . Smokeless tobacco: Never Used  Substance and Sexual Activity  . Alcohol use: Yes    Alcohol/week: 12.0 standard drinks    Types: 12 Cans of beer per week  . Drug use: Yes    Types: "Crack" cocaine  . Sexual activity: Yes    Birth control/protection: None  Other Topics Concern   . Not on file  Social History Narrative  . Not on file   Social Determinants of Health   Financial Resource Strain:   . Difficulty of Paying Living Expenses: Not on file  Food Insecurity:   . Worried About Charity fundraiser in the Last Year: Not on file  . Ran Out of Food in the Last Year: Not on file  Transportation Needs:   . Lack of Transportation (Medical): Not on file  . Lack of Transportation (Non-Medical): Not on file  Physical Activity:   . Days of Exercise per Week: Not on file  . Minutes of Exercise per Session: Not on file  Stress:   . Feeling of Stress : Not on file  Social Connections:   . Frequency of Communication with Friends and Family: Not on file  . Frequency of Social Gatherings with Friends and Family: Not on file  . Attends Religious Services: Not on file  . Active Member of Clubs or Organizations: Not on file  . Attends Archivist Meetings: Not on file  . Marital Status: Not on file  Intimate Partner Violence:   . Fear of Current or Ex-Partner: Not on file  . Emotionally Abused: Not on file  . Physically Abused: Not on file  . Sexually Abused: Not on file  Outpatient Medications Prior to Visit  Medication Sig Dispense Refill  . folic acid (FOLVITE) 1 MG tablet Take 1 tablet (1 mg total) by mouth daily. 30 tablet 0  . amLODipine (NORVASC) 5 MG tablet Take 1 tablet (5 mg total) by mouth daily. 30 tablet 11  . cloNIDine (CATAPRES) 0.2 MG tablet Take  1 tablet at night- after he is off of work 30 tablet 0  . gabapentin (NEURONTIN) 300 MG capsule Patient takes 1 tablet twice a day and 2 tablets at night. (Patient taking differently: Take 300 mg by mouth 4 (four) times daily. ) 120 capsule 1  . blood glucose meter kit and supplies KIT Dispense based on patient and insurance preference. Use up to four times daily as directed. (FOR ICD-9 250.00, 250.01). (Patient not taking: Reported on 06/23/2019) 1 each 0  . ferrous sulfate 325 (65 FE) MG EC  tablet Take 1 tablet (325 mg total) by mouth 2 (two) times daily for 60 doses. 60 tablet 0  . vitamin B-12 (CYANOCOBALAMIN) 1000 MCG tablet Take 1 tablet (1,000 mcg total) by mouth daily. (Patient not taking: Reported on 06/23/2019) 30 tablet 0  . cyclobenzaprine (FLEXERIL) 10 MG tablet Take 1 tablet (10 mg total) by mouth 2 (two) times daily as needed for muscle spasms. 8 tablet 0  . losartan (COZAAR) 25 MG tablet Take 25m every morning after breakfast (Patient taking differently: Take 25 mg by mouth every morning. ) 15 tablet 0  . naltrexone (DEPADE) 50 MG tablet Take 1 tablet (50 mg total) by mouth daily. 30 tablet 0  . pantoprazole (PROTONIX) 40 MG tablet Take 1 tablet (40 mg total) by mouth 2 (two) times daily. 60 tablet 0  . thiamine 100 MG tablet Take 1 tablet (100 mg total) by mouth daily. (Patient not taking: Reported on 01/31/2019) 30 tablet 0   No facility-administered medications prior to visit.    No Known Allergies  ROS Review of Systems  Neurological: Positive for numbness.       Tingling burning feet  All other systems reviewed and are negative.     Objective:    Physical Exam  Constitutional: He is oriented to person, place, and time. He appears well-developed and well-nourished.  HENT:  Head: Normocephalic.  Eyes: Pupils are equal, round, and reactive to light. EOM are normal.  Cardiovascular: Normal rate and regular rhythm.  Pulmonary/Chest: Effort normal and breath sounds normal.  Abdominal: Soft. Bowel sounds are normal.  Musculoskeletal:     Cervical back: Normal range of motion.  Neurological: He is oriented to person, place, and time. He has normal reflexes.  Skin: Skin is warm and dry.  Psychiatric: He has a normal mood and affect. His behavior is normal. Judgment and thought content normal.    BP 130/76 (BP Location: Left Arm, Patient Position: Sitting, Cuff Size: Large)   Pulse 71   Temp (!) 97.2 F (36.2 C) (Temporal)   Ht 5' 11"  (1.803 m)   Wt  257 lb (116.6 kg)   SpO2 99%   BMI 35.84 kg/m  Wt Readings from Last 3 Encounters:  06/23/19 257 lb (116.6 kg)  03/29/19 230 lb (104.3 kg)  01/31/19 261 lb 9.6 oz (118.7 kg)     Health Maintenance Due  Topic Date Due  . FOOT EXAM  11/09/1969  . OPHTHALMOLOGY EXAM  11/09/1969    There are no preventive care reminders to display for this patient.  Lab Results  Component Value Date   TSH  2.833 06/29/2016   Lab Results  Component Value Date   WBC 5.8 03/31/2019   HGB 7.6 (L) 03/31/2019   HCT 24.2 (L) 03/31/2019   MCV 98.8 03/31/2019   PLT 150 03/31/2019   Lab Results  Component Value Date   NA 142 03/31/2019   K 4.1 03/31/2019   CO2 23 03/31/2019   GLUCOSE 199 (H) 03/31/2019   BUN 27 (H) 03/31/2019   CREATININE 2.85 (H) 03/31/2019   BILITOT 0.5 03/30/2019   ALKPHOS 28 (L) 03/30/2019   AST 50 (H) 03/30/2019   ALT 26 03/30/2019   PROT 7.0 03/30/2019   ALBUMIN 2.5 (L) 03/30/2019   CALCIUM 8.9 03/31/2019   ANIONGAP 9 03/31/2019   Lab Results  Component Value Date   CHOL 140 05/21/2017   Lab Results  Component Value Date   HDL 39 (L) 05/21/2017   Lab Results  Component Value Date   LDLCALC 50 05/21/2017   Lab Results  Component Value Date   TRIG 220 (H) 05/22/2017   Lab Results  Component Value Date   CHOLHDL 3.6 05/21/2017   Lab Results  Component Value Date   HGBA1C 5.2 06/23/2019      Assessment & Plan:  Daniel [name] was seen today for anemia and hypertension.  Diagnoses and all orders for this visit:  Prediabetes -     HgB A1c 5.2  This reading changes diagnosis to No diabetes. Ranges for Pre diabetes 5.7- 6.4.   Essential hypertension Blood pressure is at goal goal of less than/= 130/80, low-sodium, DASH diet, medication compliance, 150 minutes of moderate intensity exercise per week. Discussed medication compliance  Neuropathy Peripheral neuropathy has burning, stabbing, or aching pain in his feet. Continue gabapentin 339m BID and qhs  6060mat bedtime  Other orders/Medication refill -     gabapentin (NEURONTIN) 300 MG capsule; Patient takes 1 tablet twice a day and 2 tablets at night. -     amLODipine (NORVASC) 10 MG tablet; Take 1 tablet (10 mg total) by mouth daily. -     cloNIDine (CATAPRES) 0.2 MG tablet; Take  1 tablet twice daily . If makes drowsy take 2 tablets - after he is off of work -     losartan (COZAAR) 25 MG tablet; Take 1 tablet (25 mg total) by mouth every morning.   Meds ordered this encounter  Medications  . gabapentin (NEURONTIN) 300 MG capsule    Sig: Patient takes 1 tablet twice a day and 2 tablets at night.    Dispense:  120 capsule    Refill:  1  . amLODipine (NORVASC) 10 MG tablet    Sig: Take 1 tablet (10 mg total) by mouth daily.    Dispense:  90 tablet    Refill:  1  . cloNIDine (CATAPRES) 0.2 MG tablet    Sig: Take  1 tablet twice daily . If makes drowsy take 2 tablets - after he is off of work    Dispense:  180 tablet    Refill:  1  . losartan (COZAAR) 25 MG tablet    Sig: Take 1 tablet (25 mg total) by mouth every morning.    Dispense:  90 tablet    Refill:  1    Follow-up: No follow-ups on file.    MiKerin PernaNP

## 2019-06-23 NOTE — Progress Notes (Signed)
Is patient supposed to be taking HCTZ

## 2019-06-27 ENCOUNTER — Observation Stay (HOSPITAL_COMMUNITY): Payer: Self-pay

## 2019-06-27 ENCOUNTER — Other Ambulatory Visit: Payer: Self-pay

## 2019-06-27 ENCOUNTER — Emergency Department (HOSPITAL_COMMUNITY): Payer: Self-pay

## 2019-06-27 ENCOUNTER — Encounter (HOSPITAL_COMMUNITY): Payer: Self-pay | Admitting: Emergency Medicine

## 2019-06-27 ENCOUNTER — Inpatient Hospital Stay (HOSPITAL_COMMUNITY)
Admission: EM | Admit: 2019-06-27 | Discharge: 2019-07-01 | DRG: 377 | Disposition: A | Discharge status: Home / Self Care | Payer: Self-pay | Attending: Internal Medicine | Admitting: Internal Medicine

## 2019-06-27 DIAGNOSIS — Z79899 Other long term (current) drug therapy: Secondary | ICD-10-CM

## 2019-06-27 DIAGNOSIS — F17210 Nicotine dependence, cigarettes, uncomplicated: Secondary | ICD-10-CM | POA: Diagnosis present

## 2019-06-27 DIAGNOSIS — N179 Acute kidney failure, unspecified: Secondary | ICD-10-CM

## 2019-06-27 DIAGNOSIS — K5730 Diverticulosis of large intestine without perforation or abscess without bleeding: Secondary | ICD-10-CM | POA: Diagnosis present

## 2019-06-27 DIAGNOSIS — Z9889 Other specified postprocedural states: Secondary | ICD-10-CM

## 2019-06-27 DIAGNOSIS — E669 Obesity, unspecified: Secondary | ICD-10-CM | POA: Diagnosis present

## 2019-06-27 DIAGNOSIS — B1920 Unspecified viral hepatitis C without hepatic coma: Secondary | ICD-10-CM | POA: Diagnosis present

## 2019-06-27 DIAGNOSIS — N184 Chronic kidney disease, stage 4 (severe): Secondary | ICD-10-CM

## 2019-06-27 DIAGNOSIS — J189 Pneumonia, unspecified organism: Secondary | ICD-10-CM

## 2019-06-27 DIAGNOSIS — E538 Deficiency of other specified B group vitamins: Secondary | ICD-10-CM | POA: Diagnosis present

## 2019-06-27 DIAGNOSIS — I472 Ventricular tachycardia: Secondary | ICD-10-CM | POA: Diagnosis not present

## 2019-06-27 DIAGNOSIS — J181 Lobar pneumonia, unspecified organism: Secondary | ICD-10-CM | POA: Diagnosis present

## 2019-06-27 DIAGNOSIS — K922 Gastrointestinal hemorrhage, unspecified: Secondary | ICD-10-CM | POA: Diagnosis present

## 2019-06-27 DIAGNOSIS — E860 Dehydration: Secondary | ICD-10-CM

## 2019-06-27 DIAGNOSIS — D649 Anemia, unspecified: Secondary | ICD-10-CM

## 2019-06-27 DIAGNOSIS — I129 Hypertensive chronic kidney disease with stage 1 through stage 4 chronic kidney disease, or unspecified chronic kidney disease: Secondary | ICD-10-CM | POA: Diagnosis present

## 2019-06-27 DIAGNOSIS — Z20822 Contact with and (suspected) exposure to covid-19: Secondary | ICD-10-CM | POA: Diagnosis present

## 2019-06-27 DIAGNOSIS — D509 Iron deficiency anemia, unspecified: Secondary | ICD-10-CM | POA: Diagnosis present

## 2019-06-27 DIAGNOSIS — Z6839 Body mass index (BMI) 39.0-39.9, adult: Secondary | ICD-10-CM

## 2019-06-27 DIAGNOSIS — D539 Nutritional anemia, unspecified: Secondary | ICD-10-CM | POA: Diagnosis present

## 2019-06-27 DIAGNOSIS — Z7289 Other problems related to lifestyle: Secondary | ICD-10-CM

## 2019-06-27 DIAGNOSIS — Z9119 Patient's noncompliance with other medical treatment and regimen: Secondary | ICD-10-CM

## 2019-06-27 DIAGNOSIS — N1832 Chronic kidney disease, stage 3b: Secondary | ICD-10-CM | POA: Diagnosis present

## 2019-06-27 DIAGNOSIS — E1122 Type 2 diabetes mellitus with diabetic chronic kidney disease: Secondary | ICD-10-CM | POA: Diagnosis present

## 2019-06-27 DIAGNOSIS — K921 Melena: Principal | ICD-10-CM | POA: Diagnosis present

## 2019-06-27 LAB — DIFFERENTIAL
Abs Immature Granulocytes: 0 10*3/uL (ref 0.00–0.07)
Basophils Absolute: 0.1 10*3/uL (ref 0.0–0.1)
Basophils Relative: 1 %
Eosinophils Absolute: 0.1 10*3/uL (ref 0.0–0.5)
Eosinophils Relative: 1 %
Lymphocytes Relative: 33 %
Lymphs Abs: 1.9 10*3/uL (ref 0.7–4.0)
Monocytes Absolute: 0.2 10*3/uL (ref 0.1–1.0)
Monocytes Relative: 3 %
Neutro Abs: 3.5 10*3/uL (ref 1.7–7.7)
Neutrophils Relative %: 62 %
nRBC: 2 /100 WBC — ABNORMAL HIGH

## 2019-06-27 LAB — RESPIRATORY PANEL BY RT PCR (FLU A&B, COVID)
Influenza A by PCR: NEGATIVE
Influenza B by PCR: NEGATIVE
SARS Coronavirus 2 by RT PCR: NEGATIVE

## 2019-06-27 LAB — PROTIME-INR
INR: 1 (ref 0.8–1.2)
Prothrombin Time: 13 seconds (ref 11.4–15.2)

## 2019-06-27 LAB — CBG MONITORING, ED: Glucose-Capillary: 122 mg/dL — ABNORMAL HIGH (ref 70–99)

## 2019-06-27 LAB — COMPREHENSIVE METABOLIC PANEL
ALT: 31 U/L (ref 0–44)
AST: 69 U/L — ABNORMAL HIGH (ref 15–41)
Albumin: 2.2 g/dL — ABNORMAL LOW (ref 3.5–5.0)
Alkaline Phosphatase: 39 U/L (ref 38–126)
Anion gap: 11 (ref 5–15)
BUN: 64 mg/dL — ABNORMAL HIGH (ref 6–20)
CO2: 15 mmol/L — ABNORMAL LOW (ref 22–32)
Calcium: 8 mg/dL — ABNORMAL LOW (ref 8.9–10.3)
Chloride: 114 mmol/L — ABNORMAL HIGH (ref 98–111)
Creatinine, Ser: 7.02 mg/dL — ABNORMAL HIGH (ref 0.61–1.24)
GFR calc Af Amer: 9 mL/min — ABNORMAL LOW (ref >60)
GFR calc non Af Amer: 8 mL/min — ABNORMAL LOW (ref >60)
Glucose, Bld: 125 mg/dL — ABNORMAL HIGH (ref 70–99)
Potassium: 3.5 mmol/L (ref 3.5–5.1)
Sodium: 140 mmol/L (ref 135–145)
Total Bilirubin: 0.3 mg/dL (ref 0.3–1.2)
Total Protein: 6.5 g/dL (ref 6.5–8.1)

## 2019-06-27 LAB — APTT: aPTT: 33 seconds (ref 24–36)

## 2019-06-27 LAB — BASIC METABOLIC PANEL
Anion gap: 10 (ref 5–15)
BUN: 63 mg/dL — ABNORMAL HIGH (ref 6–20)
CO2: 15 mmol/L — ABNORMAL LOW (ref 22–32)
Calcium: 7.6 mg/dL — ABNORMAL LOW (ref 8.9–10.3)
Chloride: 118 mmol/L — ABNORMAL HIGH (ref 98–111)
Creatinine, Ser: 6.76 mg/dL — ABNORMAL HIGH (ref 0.61–1.24)
GFR calc Af Amer: 9 mL/min — ABNORMAL LOW (ref >60)
GFR calc non Af Amer: 8 mL/min — ABNORMAL LOW (ref >60)
Glucose, Bld: 199 mg/dL — ABNORMAL HIGH (ref 70–99)
Potassium: 3.6 mmol/L (ref 3.5–5.1)
Sodium: 143 mmol/L (ref 135–145)

## 2019-06-27 LAB — PREPARE RBC (CROSSMATCH)

## 2019-06-27 LAB — URINALYSIS, ROUTINE W REFLEX MICROSCOPIC
Bacteria, UA: NONE SEEN
Bilirubin Urine: NEGATIVE
Glucose, UA: NEGATIVE mg/dL
Ketones, ur: NEGATIVE mg/dL
Leukocytes,Ua: NEGATIVE
Nitrite: NEGATIVE
Protein, ur: 100 mg/dL — AB
Specific Gravity, Urine: 1.009 (ref 1.005–1.030)
pH: 5 (ref 5.0–8.0)

## 2019-06-27 LAB — CBC
HCT: 20.2 % — ABNORMAL LOW (ref 39.0–52.0)
Hemoglobin: 6.4 g/dL — CL (ref 13.0–17.0)
MCH: 35.6 pg — ABNORMAL HIGH (ref 26.0–34.0)
MCHC: 31.7 g/dL (ref 30.0–36.0)
MCV: 112.2 fL — ABNORMAL HIGH (ref 80.0–100.0)
Platelets: 128 10*3/uL — ABNORMAL LOW (ref 150–400)
RBC: 1.8 MIL/uL — ABNORMAL LOW (ref 4.22–5.81)
RDW: 16.4 % — ABNORMAL HIGH (ref 11.5–15.5)
WBC: 5.7 10*3/uL (ref 4.0–10.5)
nRBC: 0.7 % — ABNORMAL HIGH (ref 0.0–0.2)

## 2019-06-27 LAB — LACTIC ACID, PLASMA: Lactic Acid, Venous: 1.2 mmol/L (ref 0.5–1.9)

## 2019-06-27 LAB — POC OCCULT BLOOD, ED: Fecal Occult Bld: POSITIVE — AB

## 2019-06-27 MED ORDER — SODIUM CHLORIDE 0.9% FLUSH
3.0000 mL | Freq: Once | INTRAVENOUS | Status: AC
  Administered 2019-06-27: 15:00:00 3 mL via INTRAVENOUS

## 2019-06-27 MED ORDER — SODIUM CHLORIDE 0.9 % IV BOLUS
1000.0000 mL | Freq: Once | INTRAVENOUS | Status: AC
  Administered 2019-06-27: 13:00:00 1000 mL via INTRAVENOUS

## 2019-06-27 MED ORDER — PANTOPRAZOLE SODIUM 40 MG IV SOLR
40.0000 mg | Freq: Once | INTRAVENOUS | Status: AC
  Administered 2019-06-27: 40 mg via INTRAVENOUS
  Filled 2019-06-27: qty 40

## 2019-06-27 MED ORDER — ACETAMINOPHEN 650 MG RE SUPP: 650.0000 mg | Freq: Four times a day (QID) | RECTAL | Status: DC | PRN

## 2019-06-27 MED ORDER — SODIUM CHLORIDE 0.9 % IV SOLN
500.0000 mg | Freq: Once | INTRAVENOUS | Status: AC
  Administered 2019-06-27: 500 mg via INTRAVENOUS
  Filled 2019-06-27: qty 500

## 2019-06-27 MED ORDER — CLONIDINE HCL 0.1 MG PO TABS
0.1000 mg | ORAL_TABLET | Freq: Two times a day (BID) | ORAL | Status: DC
  Administered 2019-06-27 – 2019-06-28 (×3): 0.1 mg via ORAL
  Filled 2019-06-27 (×3): qty 1

## 2019-06-27 MED ORDER — SODIUM CHLORIDE 0.9 % IV BOLUS: 500.0000 mL | Freq: Once | INTRAVENOUS | Status: DC

## 2019-06-27 MED ORDER — SODIUM CHLORIDE 0.9 % IV SOLN
2.0000 g | Freq: Once | INTRAVENOUS | Status: AC
  Administered 2019-06-27: 13:00:00 2 g via INTRAVENOUS
  Filled 2019-06-27: qty 20

## 2019-06-27 MED ORDER — ACETAMINOPHEN 325 MG PO TABS: 650.0000 mg | ORAL_TABLET | Freq: Four times a day (QID) | ORAL | Status: DC | PRN

## 2019-06-27 MED ORDER — PANTOPRAZOLE SODIUM 40 MG IV SOLR
40.0000 mg | Freq: Two times a day (BID) | INTRAVENOUS | Status: DC
  Administered 2019-06-27 – 2019-06-28 (×2): 40 mg via INTRAVENOUS
  Filled 2019-06-27 (×2): qty 40

## 2019-06-27 MED ORDER — GABAPENTIN 300 MG PO CAPS
300.0000 mg | ORAL_CAPSULE | Freq: Every day | ORAL | Status: DC
  Administered 2019-06-27 – 2019-06-30 (×4): 300 mg via ORAL
  Filled 2019-06-27 (×4): qty 1

## 2019-06-27 MED ORDER — AMLODIPINE BESYLATE 10 MG PO TABS
10.0000 mg | ORAL_TABLET | Freq: Every day | ORAL | Status: DC
  Administered 2019-06-28 – 2019-07-01 (×4): 10 mg via ORAL
  Filled 2019-06-27 (×4): qty 1

## 2019-06-27 MED ORDER — SODIUM CHLORIDE 0.9 % IV SOLN
10.0000 mL/h | Freq: Once | INTRAVENOUS | Status: AC
  Administered 2019-06-27: 10 mL/h via INTRAVENOUS

## 2019-06-27 MED ORDER — THIAMINE HCL 100 MG PO TABS
100.0000 mg | ORAL_TABLET | Freq: Every day | ORAL | Status: DC
  Administered 2019-06-27 – 2019-07-01 (×5): 100 mg via ORAL
  Filled 2019-06-27 (×6): qty 1

## 2019-06-27 MED ORDER — FOLIC ACID 1 MG PO TABS
1.0000 mg | ORAL_TABLET | Freq: Every day | ORAL | Status: DC
  Administered 2019-06-27 – 2019-07-01 (×5): 1 mg via ORAL
  Filled 2019-06-27 (×5): qty 1

## 2019-06-27 MED ORDER — SODIUM CHLORIDE 0.9 % IV SOLN
1.0000 g | INTRAVENOUS | Status: DC
  Administered 2019-06-28: 10:00:00 1 g via INTRAVENOUS
  Filled 2019-06-27: qty 1

## 2019-06-27 MED ORDER — DEXTROSE 5 % IV SOLN
250.0000 mg | INTRAVENOUS | Status: DC
  Filled 2019-06-27 (×2): qty 250

## 2019-06-27 NOTE — ED Notes (Signed)
Hooked patient back up to the monitor patient is resting with call bell in reach 

## 2019-06-27 NOTE — ED Notes (Signed)
Pt returned from XR at this time. Placed pt back on monitor, pt placed in position of comfort and advised of wait status.   

## 2019-06-27 NOTE — ED Notes (Signed)
Date and time results received: 06/27/2110:56 PM   Test: Hemoglobin Critical Value: 6.4  Name of Provider Notified: Manuela Schwartz, PA  Orders to follow.

## 2019-06-27 NOTE — ED Provider Notes (Signed)
[facility] EMERGENCY DEPARTMENT Provider Note   CSN: [id number] Arrival date & time: 06/27/19  1054     History Chief Complaint  Patient presents with  . Dizziness    Daniel [name] is a 59 y.o. male with a past medical history of diabetes, hypertension, prior ICH presenting to the ED with a chief complaint of dizziness.  Approximately 30 hours ago started having dizziness, feeling lightheadedness and "off balance" when he woke up yesterday.  States that he had similar symptoms in the past which improved over time.  He did have a fall yesterday while he was going up the steps but denies any head injury or loss of consciousness.  He believes this was because of his ongoing dizziness.  Does report some shortness of breath but denies any cough, chest pain or leg swelling.  He has been taking his home medications as prescribed.  Cannot recall any incident 2 days ago that may have triggered this dizziness.  Denies any vomiting, diarrhea, abdominal pain, numbness in arms or legs, vision changes, headache, fever or neck stiffness.  HPI     Past Medical History:  Diagnosis Date  . Diabetes mellitus without complication ([place])   . Hypertension   . ICH (intracerebral hemorrhage) ([place]) 05/20/2017  . Shoulder pain, left 06/28/2013    Patient Active Problem List   Diagnosis Date Noted  . Iron deficiency anemia 03/30/2019  . CKD (chronic kidney disease) stage 4, GFR 15-29 ml/min ([facility]) 03/30/2019  . Alcohol use disorder 03/30/2019  . B12 deficiency 03/30/2019  . Symptomatic anemia 03/29/2019  . Anemia   . Heme positive stool   . Hypertension 06/29/2016  . DM2 (diabetes mellitus, type 2) ([place]) 06/29/2016    Past Surgical History:  Procedure Laterality Date  . APPENDECTOMY    . COLONOSCOPY  01/23/2012   Procedure: COLONOSCOPY;  Surgeon: Danie Binder, MD;  [facility];  Service: Endoscopy;  Laterality: N/A;  11:10 AM  . INCISION AND DRAINAGE ABSCESS N/A  06/29/2016   Procedure: INCISION AND DRAINAGE ABDOMINAL WALL ABSCESS;  Surgeon: Alphonsa Overall, MD;  Location: [facility];  Service: General;  Laterality: N/A;  . Left heel surgery         Family History  Problem Relation Age of Onset  . Colon cancer Neg Hx     Social History   Tobacco Use  . Smoking status: Current Every Day Smoker    Packs/day: 1.00    Years: 30.00    Pack years: 30.00    Types: Cigarettes  . Smokeless tobacco: Never Used  Substance Use Topics  . Alcohol use: Yes    Alcohol/week: 12.0 standard drinks    Types: 12 Cans of beer per week  . Drug use: Yes    Types: "Crack" cocaine    Home Medications Prior to Admission medications   Medication Sig Start Date End Date Taking? Authorizing Provider  amLODipine (NORVASC) 10 MG tablet Take 1 tablet (10 mg total) by mouth daily. 06/23/19 06/22/20 Yes [name], NP  cloNIDine (CATAPRES) 0.2 MG tablet Take  1 tablet twice daily . If makes drowsy take 2 tablets - after he is off of work 06/23/19  Yes Edwards, Milford Cage, NP  folic acid (FOLVITE) 1 MG tablet Take 1 tablet (1 mg total) by mouth daily. 01/25/19  Yes Winfrey, Alcario Drought, MD  gabapentin (NEURONTIN) 300 MG capsule Patient takes 1 tablet twice a day and 2 tablets at night. Patient taking differently:  Take 300-600 mg by mouth See admin instructions. Patient takes 1 capsule twice a day and 2 capsules (630m) at night. 06/23/19  Yes EKerin Perna NP  blood glucose meter kit and supplies KIT Dispense based on patient and insurance preference. Use up to four times daily as directed. (FOR ICD-9 250.00, 250.01). Patient not taking: Reported on 06/23/2019 07/01/16   SNita Sells MD  ferrous sulfate 325 (65 FE) MG EC tablet Take 1 tablet (325 mg total) by mouth 2 (two) times daily for 60 doses. 03/31/19 04/30/19  CMitzi Hansen MD  losartan (COZAAR) 25 MG tablet Take 1 tablet (25 mg total) by mouth every morning. 06/23/19   EKerin Perna NP  vitamin B-12  (CYANOCOBALAMIN) 1000 MCG tablet Take 1 tablet (1,000 mcg total) by mouth daily. Patient not taking: Reported on 06/23/2019 03/31/19   CMitzi Hansen MD    Allergies    Patient has no known allergies.  Review of Systems   Review of Systems  Constitutional: Negative for appetite change, chills and fever.  HENT: Negative for ear pain, rhinorrhea, sneezing and sore throat.   Eyes: Negative for photophobia and visual disturbance.  Respiratory: Positive for shortness of breath. Negative for cough, chest tightness and wheezing.   Cardiovascular: Negative for chest pain and palpitations.  Gastrointestinal: Negative for abdominal pain, blood in stool, constipation, diarrhea, nausea and vomiting.  Genitourinary: Negative for dysuria, hematuria and urgency.  Musculoskeletal: Negative for myalgias.  Skin: Negative for rash.  Neurological: Positive for dizziness and light-headedness. Negative for weakness.    Physical Exam Updated Vital Signs BP 138/81 (BP Location: Right Arm)   Pulse 69   Temp 98.3 F (36.8 C) (Oral)   Resp 18   Ht 5' 11"  (1.803 m)   Wt 116.6 kg   SpO2 100%   BMI 35.84 kg/m   Physical Exam Vitals and nursing note reviewed. Exam conducted with a chaperone present.  Constitutional:      General: He is not in acute distress.    Appearance: He is well-developed. He is obese.     Comments: Speaking in complete sentences without difficulty.  HENT:     Head: Normocephalic and atraumatic.     Nose: Nose normal.  Eyes:     General: No scleral icterus.       Right eye: No discharge.        Left eye: No discharge.     Conjunctiva/sclera: Conjunctivae normal.     Pupils: Pupils are equal, round, and reactive to light.  Cardiovascular:     Rate and Rhythm: Normal rate and regular rhythm.     Heart sounds: Normal heart sounds. No murmur. No friction rub. No gallop.   Pulmonary:     Effort: Pulmonary effort is normal. No respiratory distress.     Breath sounds: Normal  breath sounds.  Abdominal:     General: Bowel sounds are normal. There is no distension.     Palpations: Abdomen is soft.     Tenderness: There is no abdominal tenderness. There is no guarding.  Genitourinary:    Rectum: Guaiac result positive.     Comments: Rectal exam without tenderness.  Brown stool noted on exam. Musculoskeletal:        General: Normal range of motion.     Cervical back: Normal range of motion and neck supple.  Skin:    General: Skin is warm and dry.     Findings: No rash.  Neurological:     General:  No focal deficit present.     Mental Status: He is alert and oriented to person, place, and time.     Cranial Nerves: No cranial nerve deficit.     Sensory: No sensory deficit.     Motor: No weakness or abnormal muscle tone.     Coordination: Coordination normal.     Comments: Pupils reactive. No facial asymmetry noted. Cranial nerves appear grossly intact. Sensation intact to light touch on face, BUE and BLE. Strength 5/5 in BUE and BLE. Normal finger-to-nose coordination bilaterally.  No pronator drift.     ED Results / Procedures / Treatments   Labs (all labs ordered are listed, but only abnormal results are displayed) Labs Reviewed  CBC - Abnormal; Notable for the following components:      Result Value   RBC 1.80 (*)    Hemoglobin 6.4 (*)    HCT 20.2 (*)    MCV 112.2 (*)    MCH 35.6 (*)    RDW 16.4 (*)    Platelets 128 (*)    nRBC 0.7 (*)    All other components within normal limits  DIFFERENTIAL - Abnormal; Notable for the following components:   nRBC 2 (*)    All other components within normal limits  COMPREHENSIVE METABOLIC PANEL - Abnormal; Notable for the following components:   Chloride 114 (*)    CO2 15 (*)    Glucose, Bld 125 (*)    BUN 64 (*)    Creatinine, Ser 7.02 (*)    Calcium 8.0 (*)    Albumin 2.2 (*)    AST 69 (*)    GFR calc non Af Amer 8 (*)    GFR calc Af Amer 9 (*)    All other components within normal limits  CBG  MONITORING, ED - Abnormal; Notable for the following components:   Glucose-Capillary 122 (*)    All other components within normal limits  POC OCCULT BLOOD, ED - Abnormal; Notable for the following components:   Fecal Occult Bld POSITIVE (*)    All other components within normal limits  RESPIRATORY PANEL BY RT PCR (FLU A&B, COVID)  PROTIME-INR  APTT  URINALYSIS, ROUTINE W REFLEX MICROSCOPIC  PREPARE RBC (CROSSMATCH)  TYPE AND SCREEN    EKG EKG Interpretation  Date/Time:  Monday June 27 2019 10:59:14 EST Ventricular Rate:  75 PR Interval:  180 QRS Duration: 80 QT Interval:  374 QTC Calculation: 417 R Axis:   -3 Text Interpretation: Normal sinus rhythm Anterior infarct , age undetermined Abnormal ECG nonspecific ST/T changes similar to Oct 2020 Confirmed by Sherwood Gambler [phone]) on 06/27/2019 11:19:38 AM   Radiology DG Chest 2 View  Result Date: 06/27/2019 CLINICAL DATA:  Short of breath. EXAM: CHEST - 2 VIEW COMPARISON:  03/29/2019 FINDINGS: Mild cardiac enlargement. No pleural effusion identified. Left lower lobe airspace consolidation is identified. Right lung clear. 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 Followup PA and lateral chest X-ray is recommended in 3-4 weeks following trial of antibiotic therapy to ensure resolution and exclude underlying malignancy. Electronically Signed   By: Kerby Moors M.D.   On: 06/27/2019 12:19   CT Head Wo Contrast  Result Date: 06/27/2019 CLINICAL DATA:  Patient status post fall yesterday. Initial encounter. EXAM: CT HEAD WITHOUT CONTRAST TECHNIQUE: Contiguous axial images were obtained from the base of the skull through the vertex without intravenous contrast. COMPARISON:  Head CT scan 01/22/2019.  FINDINGS: Brain: No evidence of acute infarction, hemorrhage, hydrocephalus, extra-axial collection or mass lesion/mass effect. Vascular:  Atherosclerosis. Skull: Intact.  No focal lesion. Sinuses/Orbits: Minimal mucosal thickening in the maxillary sinuses is seen. Other: None. IMPRESSION: No acute abnormality. Atherosclerosis. Very mild mucosal thickening in the maxillary sinuses. Electronically Signed   By: Inge Rise M.D.   On: 06/27/2019 13:06    Procedures .Critical Care Performed by: Delia Heady, PA-C Authorized by: Delia Heady, PA-C   Critical care provider statement:    Critical care time (minutes):  35   Critical care time was exclusive of:  Separately billable procedures and treating other patients   Critical care was necessary to treat or prevent imminent or life-threatening deterioration of the following conditions:  Cardiac failure, circulatory failure, CNS failure or compromise, renal failure, respiratory failure and dehydration   Critical care was time spent personally by me on the following activities:  Development of treatment plan with patient or surrogate, discussions with consultants, evaluation of patient's response to treatment, examination of patient, obtaining history from patient or surrogate, ordering and performing treatments and interventions, ordering and review of laboratory studies, ordering and review of radiographic studies, pulse oximetry, re-evaluation of patient's condition and review of old charts   I assumed direction of critical care for this patient from another provider in my specialty: no     (including critical care time)  Medications Ordered in ED Medications  azithromycin (ZITHROMAX) 500 mg in sodium chloride 0.9 % 250 mL IVPB (500 mg Intravenous New Bag/Given 06/27/19 1431)  0.9 %  sodium chloride infusion (has no administration in time range)  sodium chloride flush (NS) 0.9 % injection 3 mL (3 mLs Intravenous Given 06/27/19 1433)  sodium chloride 0.9 % bolus 1,000 mL (1,000 mLs Intravenous New Bag/Given 06/27/19 1313)  cefTRIAXone (ROCEPHIN) 2 g in sodium chloride 0.9 % 100 mL IVPB  (2 g Intravenous New Bag/Given 06/27/19 1313)  pantoprazole (PROTONIX) injection 40 mg (40 mg Intravenous Given 06/27/19 1429)    ED Course  I have reviewed the triage vital signs and the nursing notes.  Pertinent labs & imaging results that were available during my care of the patient were reviewed by me and considered in my medical decision making (see chart for details).  Clinical Course as of Jun 26 1448  Mon Jun 27, 2019  1127 Increased from 2.8 in December 2020.  Creatinine(!): 7.02 [HK]  1253 BUN(!): 64 [HK]  1254 Potassium: 3.5 [HK]  1322 Baseline around 7.5. Will order 1 unit to transfuse. Patient reports some darker appearing stools over the past 3-4 days without history of GI bleed in the past.  Does not believe he has ever had a colonoscopy or endoscopy.  Hemoglobin(!!): 6.4 [HK]  1323 Fecal Occult Blood, POC(!): POSITIVE [HK]  1344 Spoke to Dr. Watt Climes from [place] who recommends Protonix but no other recommendations at this time for any emergent intervention due to the remainder of his work-up.   [HK]    Clinical Course User Index [HK] Delia Heady, PA-C   MDM Rules/Calculators/A&P                      ANDI MAHAFFY was evaluated in Emergency Department on 06/27/19 for the symptoms described in the history of present illness. He/she was evaluated in the context of the global COVID-19 pandemic, which necessitated consideration that the patient might be at risk for infection with the SARS-CoV-2 virus that causes COVID-19. Institutional protocols and  algorithms that pertain to the evaluation of patients at risk for COVID-19 are in a state of rapid change based on information released by regulatory bodies including the CDC and federal and state organizations. These policies and algorithms were followed during the patient's care in the ED.  59 year old male presents to the ED for dizziness.  Symptoms started yesterday.  Did have a fall yesterday due to his dizziness but denies  any head injury or loss of consciousness.  Does report some dark stools for the past 3 to 4 days.  Work-up here significant for hemoglobin of 6.4 and his baseline is around 7.5.  Creatinine of 7, this was significantly increased from his creatinine 3 months ago which was around 2.8.  Hemoccult is positive.  We will start him on Protonix, antibiotics for left lower lobe pneumonia seen on x-ray and will get him admitted to medicine service.  Final Clinical Impression(s) / ED Diagnoses Final diagnoses:  Community acquired pneumonia of left lower lobe of lung  AKI (acute kidney injury) ([place])  Dehydration  Symptomatic anemia    Rx / DC Orders ED Discharge Orders    None     Portions of this note were generated with Dragon dictation software. Dictation errors may occur despite best attempts at proofreading.    Delia Heady, PA-C 06/27/19 [place], MD 06/28/19 [phone]

## 2019-06-27 NOTE — ED Notes (Signed)
CBG 122 

## 2019-06-27 NOTE — ED Notes (Signed)
Pt transported to XR at this time.

## 2019-06-27 NOTE — H&P (Signed)
   Date: 06/27/2019               Patient Name:  Daniel [name] MRN: [id number]  DOB: 05/24/1960 Age / Sex: 58 y.o., male   PCP: Edwards, Michelle P, NP         Medical Service: Internal Medicine Teaching Service         Attending Physician: Dr. Hoffman, Erik C, DO    First Contact: Dr.  Pager: [phone]  Second Contact: Dr. Seawell Pager: [phone]       After Hours (After 5p/  First Contact Pager: [phone]  weekends / holidays): Second Contact Pager: [phone]   Chief Complaint: dizziness  History of Present Illness:  [name] is a 58yo male with PMH hypertension, CKD Stage IV, mild alcohol use disorder, and B12 deficiency, who presents of a 1 day history of dizziness. He was in his usual state of health until yesterday when he developed sudden onset dizziness when walking up the stairs. Pt states he stumbled and fell at that time but denies head trauma, loss of consciousness, or any other injury. Endorses that his dizziness feels like he is going to pass out, is generally weak everywhere, and has associated palpitations. It recurs when he is up and walking/excerting himself. Denies any room spinning sensations or changes with head movement. He states he has been having vision changes/seeing stars each morning when he sits up to get out of bed.  He endorses a chronic cough with new sputum production. Denies chest pain, shortness of breath, sick contacts, nausea, vomiting, or abdominal pain. He is having bowel movements once every three days. He denies BRB but they have been soft and dark for months. He denies dysuria or difficulty with urination. He does feel like he has decreased urination for the past week. His appetite has not changed and he's been able to drink and eat food.   Of note, he was recently admitted for symptomatic anemia in Dec 2020. He was instructed to follow-up with GI for further anemia work-up but was unable to as he wasn't sure where to go. He did  follow-up with his PCP. He has never seen a nephrologist.   In the ED, pt was afebrile, HR 74, BP 127/73, RR 24, and O2 saturation 98% on room air. CBC significant for Hgb 6.4, MCV 112, plts 128, and WBC 5.7. CMP revealed Cr 7.02 (prior baseline of 2.8), BUN 64, bicarb 15, K 3.5, Na 140,  AST 69, ALT 31, alk phos 39, and t bili 0.3. His FOBT was positive. CXR showed a LLL pneumonia. CT head was negative for hemorrhage, infarct, or mass. He was given 1L NS bolus, 40mg IV pantoprazole, and azithromycin/ceftriaxone. IMTS was called for admission.  Social: He drinks 1 40oz beer per day but has never had symptoms of withdrawal.  He is a current smoker, has a 40 pack year history. Denies marijuana, cocaine, or other substance use. He lives at home alone and is able to complete his ADLs.   Family History:  Pt's brother has hypertension. Pt is not aware of a family history of diabetes, hyperlipidemia, heart disease, strokes, or CKD.  Meds:  Current Meds  Medication Sig  . amLODipine (NORVASC) 10 MG tablet Take 1 tablet (10 mg total) by mouth daily.  . cloNIDine (CATAPRES) 0.2 MG tablet Take  1 tablet twice daily . If makes drowsy take 2 tablets - after he is off of work  . folic acid (FOLVITE)   1 MG tablet Take 1 tablet (1 mg total) by mouth daily.  . gabapentin (NEURONTIN) 300 MG capsule Patient takes 1 tablet twice a day and 2 tablets at night. (Patient taking differently: Take 300-600 mg by mouth See admin instructions. Patient takes 1 capsule twice a day and 2 capsules (600mg) at night.)   Allergies: Allergies as of 06/27/2019  . (No Known Allergies)   Past Medical History:  Diagnosis Date  . Diabetes mellitus without complication (HCC)   . Hypertension   . ICH (intracerebral hemorrhage) (HCC) 05/20/2017  . Shoulder pain, left 06/28/2013   Review of Systems: A complete ROS was negative except as per HPI.   Physical Exam: Blood pressure (!) 142/90, pulse 72, temperature 98.1 F (36.7 C),  temperature source Oral, resp. rate 19, height 5' 11" (1.803 m), weight 116.6 kg, SpO2 98 %. Physical Exam Vitals and nursing note reviewed.  Constitutional:      General: He is not in acute distress.    Appearance: Normal appearance. He is not ill-appearing.  HENT:     Head: Normocephalic and atraumatic.  Eyes:     Extraocular Movements: Extraocular movements intact.     Conjunctiva/sclera: Conjunctivae normal.  Cardiovascular:     Rate and Rhythm: Normal rate and regular rhythm.     Heart sounds: Normal heart sounds. No murmur. No friction rub. No gallop.   Pulmonary:     Effort: Pulmonary effort is normal. No tachypnea, prolonged expiration or respiratory distress.     Comments: Scattered end expiratory wheezing throughout. Course crackles in the left lower lobe. Abdominal:     General: Abdomen is flat. Bowel sounds are normal. There is distension (secondary to body habitus).     Palpations: Abdomen is soft.     Tenderness: There is no abdominal tenderness.  Musculoskeletal:        General: No swelling or tenderness. Normal range of motion.     Comments: Trace non-pitting LE edema.  Skin:    General: Skin is warm and dry.  Neurological:     General: No focal deficit present.     Mental Status: He is alert and oriented to person, place, and time.     Comments: Pt moving all extremities spontaneously against gravity.  Full and symmetric sensation to light touch in bilateral LE.  Psychiatric:        Mood and Affect: Mood normal.    EKG: personally reviewed my interpretation is normal rate, sinus rhythm, no ST segment elevations, unchanged from prior on 03/29/2019.  CXR: personally reviewed my interpretation is left lower lobe opacity best visualized on lateral view, no effusions or pneumothorax, degenerative changes of the thoracic spine  Assessment & Plan by Problem: Active Problems:   Upper GI bleed  [name] is a 58yo male with PMH hypertension, CKD Stage IV,  mild alcohol use disorder, and B12 deficiency, who presents of a 1 day history of dizziness and generalized weakness and was found to have a Hgb of 6.4 consistent with symptomatic anemia. Also noted to have acute renal failure and a left lower lobe pneumonia on additional work-up.  Symptomatic anemia Upper GI Bleed Hemoglobin on admission was 6.4, with prior baseline around 7.6. Pt endorses months long history of black stools and FOBT positive in the ED. He had an admission in Dec for symptomatic anemia suspected to be from a slow GI bleed. He was unable to follow-up outpatient with GI due to not knowing where to go. Colonoscopy in   2013 showed mild diverticulosis and moderate internal hemorrhoids. Pt denies ever having an EGD. Pt is not on an outpatient PPI. - pt's anemia likely multifactorial given superimposed chronic renal disease and dietary deficiencies (Vitamin B12 and Fe deficiency)  - pt hemodynamically stable at this time - transfuse 1unit pRBCs  - follow-up post-transfusion H&H - continue pantoprazole 40mg IV BID GI consulted, will see in the morning - clear liquid diet  - NPO at midnight  AKI Cr on admission was 7.02, with prior baseline around 2.8. Pt denies dysuria, frequency, difficulty urinating, or back pain. Does think he has been urinating less over the last week. Endorses good PO intake. He was unable to follow-up with nephrology after his last admission. - pt s/p 1L NS bolus in the ED - UA and microalbumin/creatinine ratio pending - renal ultrasound ordered - strict I/Os - will likely need nephrology consult in the AM  Community acquired pneumonia CXR on admission with left lower lobe pneumonia. Pt endorsing a chronic cough and new sputum production. Denies fevers/chills or sick contacts at home. CBC without leukocytosis and pt afebrile on admission. He was given azithromycin and ceftriaxone in the ED. - continue 5 day CAP coverage - radiology recommending follow-up PA  and lateral CXR in 3-4 weeks after antibiotic therapy  HTN Blood pressures ranging from 120-140s/70-90s. Per chart review, pt's PCP has him taking losartan 25mg, clonidine 0.2mg, and amlodipine 5mg for hypertension. Pt has is medications at the bedside which include HCTZ, clonidine, and amlodipine. - continue clonidine 0.1mg BID and amlodipine 10mg  Mild EtOH use Pt drinking one 40oz beer daily. Denies withdrawal symptoms or seizures. He was on CIWA during his last admission but required no Ativan. Per PCP, pt prescribed supplemental thiamine, folate, and B12, though only has folate at the bedside. - continue home folate and thiamine    Diet: clear liquid diet VTE: SCDs IVF: none Code: FULL  Dispo: Admit patient to Observation with expected length of stay less than 2 midnights.  Signed: , , MD 06/27/2019, 4:05 PM  Pager: [phone]  

## 2019-06-27 NOTE — ED Notes (Signed)
Got patient on the monitor into a gown patient is resting with call bell in reach  

## 2019-06-27 NOTE — ED Notes (Signed)
Pt transported to CT at this time.

## 2019-06-27 NOTE — ED Triage Notes (Signed)
Pt reports he woke up yesterday morning and felt dizzy and had trouble walking. Pt also had a fall yesterday in his house due to dizzy. Pt states it was no better today and drove himself to [employer] but was unable to walk inside so he drove himself to ER. Pt is alert and ox4, clear speech.

## 2019-06-28 DIAGNOSIS — K922 Gastrointestinal hemorrhage, unspecified: Secondary | ICD-10-CM | POA: Diagnosis present

## 2019-06-28 LAB — CBC WITH DIFFERENTIAL/PLATELET
Abs Immature Granulocytes: 0.03 10*3/uL (ref 0.00–0.07)
Basophils Absolute: 0 10*3/uL (ref 0.0–0.1)
Basophils Relative: 0 %
Eosinophils Absolute: 0.1 10*3/uL (ref 0.0–0.5)
Eosinophils Relative: 1 %
HCT: 21.3 % — ABNORMAL LOW (ref 39.0–52.0)
Hemoglobin: 7 g/dL — ABNORMAL LOW (ref 13.0–17.0)
Immature Granulocytes: 1 %
Lymphocytes Relative: 43 %
Lymphs Abs: 2.3 10*3/uL (ref 0.7–4.0)
MCH: 35 pg — ABNORMAL HIGH (ref 26.0–34.0)
MCHC: 32.9 g/dL (ref 30.0–36.0)
MCV: 106.5 fL — ABNORMAL HIGH (ref 80.0–100.0)
Monocytes Absolute: 0.5 10*3/uL (ref 0.1–1.0)
Monocytes Relative: 9 %
Neutro Abs: 2.4 10*3/uL (ref 1.7–7.7)
Neutrophils Relative %: 46 %
Platelets: 110 10*3/uL — ABNORMAL LOW (ref 150–400)
RBC: 2 MIL/uL — ABNORMAL LOW (ref 4.22–5.81)
RDW: 18.2 % — ABNORMAL HIGH (ref 11.5–15.5)
WBC: 5.2 10*3/uL (ref 4.0–10.5)
nRBC: 0.6 % — ABNORMAL HIGH (ref 0.0–0.2)

## 2019-06-28 LAB — COMPREHENSIVE METABOLIC PANEL
ALT: 28 U/L (ref 0–44)
AST: 68 U/L — ABNORMAL HIGH (ref 15–41)
Albumin: 2 g/dL — ABNORMAL LOW (ref 3.5–5.0)
Alkaline Phosphatase: 39 U/L (ref 38–126)
Anion gap: 10 (ref 5–15)
BUN: 64 mg/dL — ABNORMAL HIGH (ref 6–20)
CO2: 16 mmol/L — ABNORMAL LOW (ref 22–32)
Calcium: 7.9 mg/dL — ABNORMAL LOW (ref 8.9–10.3)
Chloride: 115 mmol/L — ABNORMAL HIGH (ref 98–111)
Creatinine, Ser: 6.79 mg/dL — ABNORMAL HIGH (ref 0.61–1.24)
GFR calc Af Amer: 9 mL/min — ABNORMAL LOW (ref >60)
GFR calc non Af Amer: 8 mL/min — ABNORMAL LOW (ref >60)
Glucose, Bld: 98 mg/dL (ref 70–99)
Potassium: 3.6 mmol/L (ref 3.5–5.1)
Sodium: 141 mmol/L (ref 135–145)
Total Bilirubin: 0.1 mg/dL — ABNORMAL LOW (ref 0.3–1.2)
Total Protein: 6.1 g/dL — ABNORMAL LOW (ref 6.5–8.1)

## 2019-06-28 LAB — TYPE AND SCREEN
ABO/RH(D): B POS
Antibody Screen: NEGATIVE
Unit division: 0

## 2019-06-28 LAB — HEPATITIS B SURFACE ANTIGEN: Hepatitis B Surface Ag: NONREACTIVE

## 2019-06-28 LAB — MICROALBUMIN / CREATININE URINE RATIO
Creatinine, Urine: 54.7 mg/dL
Microalb Creat Ratio: 1750 mg/g creat — ABNORMAL HIGH (ref 0–29)
Microalb, Ur: 957.5 ug/mL — ABNORMAL HIGH

## 2019-06-28 LAB — BPAM RBC
Blood Product Expiration Date: 202103232359
ISSUE DATE / TIME: 202103011526
Unit Type and Rh: 7300

## 2019-06-28 LAB — PHOSPHORUS
Phosphorus: 5 mg/dL — ABNORMAL HIGH (ref 2.5–4.6)
Phosphorus: 5.3 mg/dL — ABNORMAL HIGH (ref 2.5–4.6)

## 2019-06-28 LAB — MAGNESIUM: Magnesium: 1.9 mg/dL (ref 1.7–2.4)

## 2019-06-28 LAB — SODIUM, URINE, RANDOM: Sodium, Ur: 77 mmol/L

## 2019-06-28 LAB — CREATININE, URINE, RANDOM: Creatinine, Urine: 54.39 mg/dL

## 2019-06-28 LAB — LACTIC ACID, PLASMA: Lactic Acid, Venous: 0.8 mmol/L (ref 0.5–1.9)

## 2019-06-28 LAB — HEPATITIS C ANTIBODY: HCV Ab: REACTIVE — AB

## 2019-06-28 MED ORDER — VITAMIN B-12 1000 MCG PO TABS
1000.0000 ug | ORAL_TABLET | Freq: Every day | ORAL | Status: DC
  Administered 2019-06-28 – 2019-07-01 (×4): 1000 ug via ORAL
  Filled 2019-06-28 (×3): qty 1

## 2019-06-28 MED ORDER — SODIUM BICARBONATE 650 MG PO TABS
650.0000 mg | ORAL_TABLET | Freq: Two times a day (BID) | ORAL | Status: DC
  Administered 2019-06-28 – 2019-06-29 (×4): 650 mg via ORAL
  Filled 2019-06-28 (×4): qty 1

## 2019-06-28 MED ORDER — PANTOPRAZOLE SODIUM 40 MG PO TBEC
40.0000 mg | DELAYED_RELEASE_TABLET | Freq: Two times a day (BID) | ORAL | Status: DC
  Administered 2019-06-28 – 2019-07-01 (×6): 40 mg via ORAL
  Filled 2019-06-28 (×6): qty 1

## 2019-06-28 MED ORDER — SODIUM CHLORIDE 0.9 % IV SOLN
1.0000 g | INTRAVENOUS | Status: DC
  Administered 2019-06-29 – 2019-07-01 (×2): 1 g via INTRAVENOUS
  Filled 2019-06-28: qty 10
  Filled 2019-06-28: qty 1
  Filled 2019-06-28: qty 10

## 2019-06-28 MED ORDER — GUAIFENESIN-DM 100-10 MG/5ML PO SYRP
5.0000 mL | ORAL_SOLUTION | ORAL | Status: DC | PRN
  Administered 2019-06-29: 5 mL via ORAL
  Filled 2019-06-28 (×2): qty 5

## 2019-06-28 MED ORDER — DEXTROSE 5 % IV SOLN
250.0000 mg | INTRAVENOUS | Status: DC
  Filled 2019-06-28: qty 250

## 2019-06-28 NOTE — TOC Initial Note (Signed)
Transition of Care [facility]) - Initial/Assessment Note    Patient Details  Name: Daniel [name] MRN: [id number] Date of Birth: Oct 02, 1960  [facility] CM/SW Contact:    Marilu Favre, RN Phone Number: 06/28/2019, 1:57 PM  Clinical Narrative:                 Patient from home . Patient active with [facility] has an appointment July 21, 2019. Will use TOC at discharge. Will continue to follow for discharge needs.   Expected Discharge Plan: Home/Self Care Barriers to Discharge: Continued Medical Work up   Patient Goals and CMS Choice Patient states their goals for this hospitalization and ongoing recovery are:: to return to home CMS Medicare.gov Compare Post Acute Care list provided to:: Patient Choice offered to / list presented to : NA  Expected Discharge Plan and Services Expected Discharge Plan: Home/Self Care   Discharge Planning Services: CM Consult   Living arrangements for the past 2 months: Single Family Home                   DME Agency: NA       HH Arranged: NA          Prior Living Arrangements/Services Living arrangements for the past 2 months: Single Family Home Lives with:: Self Patient language and need for interpreter reviewed:: Yes Do you feel safe going back to the place where you live?: Yes      Need for Family Participation in Patient Care: Yes (Comment) Care giver support system in place?: Yes (comment)   Criminal Activity/Legal Involvement Pertinent to Current Situation/Hospitalization: No - Comment as needed  Activities of Daily Living      Permission Sought/Granted   Permission granted to share information with : No              Emotional Assessment Appearance:: Appears stated age Attitude/Demeanor/Rapport: Engaged Affect (typically observed): Accepting Orientation: : Oriented to Self, Oriented to Place, Oriented to  Time, Oriented to Situation Alcohol / Substance Use: Not Applicable Psych  Involvement: No (comment)  Admission diagnosis:  Dehydration [E86.0] Upper GI bleed [K92.2] AKI (acute kidney injury) ([place]) [N17.9] Symptomatic anemia [D64.9] Community acquired pneumonia of left lower lobe of lung [J18.9] GI bleed [K92.2] Patient Active Problem List   Diagnosis Date Noted  . GI bleed 06/28/2019  . Upper GI bleed 06/27/2019  . Iron deficiency anemia 03/30/2019  . CKD (chronic kidney disease) stage 4, GFR 15-29 ml/min (HCC) 03/30/2019  . Alcohol use disorder 03/30/2019  . B12 deficiency 03/30/2019  . Symptomatic anemia 03/29/2019  . Anemia   . Heme positive stool   . Hypertension 06/29/2016  . DM2 (diabetes mellitus, type 2) ([place]) 06/29/2016   PCP:  [name], NP Pharmacy:   [facility], [place] - [address] [place] [address] Phone: [phone] Fax: [phone]  [facility] [place] [place] [address] [place] [address] Phone: [phone] Fax: [phone]     Social Determinants of Health (SDOH) Interventions    Readmission Risk Interventions No flowsheet data found.

## 2019-06-28 NOTE — Consult Note (Addendum)
Referring Provider: Dr. Joni Reining Primary Care Physician:  [name], NP Primary Gastroenterologist:  [facility])  Reason for Consultation:  Symptomatic anemia  HPI: Daniel [name] is a 59 y.o. male with past medical history of Hepatitis C, hypertension, CKD Stage IV, type 2 DM, mild alcohol use disorder, and B12 deficiency presenting with symptomatic anemia.  Yesterday, he presented to the ED due to dizziness for approximately 1 day.    On arrival, hemoglobin was noted to be 6.4 (as compared to 7.6 on 03/31/2019)  and FOBT was positive.  He was transfused, and hemoglobin today is 7.0 with MCV of 106.5.    Patient typically runs hgb of 9-10, but presented in early December with hgb of 6.9, vs. 9.8 in October 2020.  He has CKD and his current BUN is 64 with creatinine of 6.79.   Following his episode of symptomatic anemia back in December 2020, he was supposed to follow-up with outpatient GI; however he did not follow-up, as he reports he did not know where he was supposed to be seen.  Prior to this, he states he was feeling normal.  He endorses intermittent melanotic stools for several months, though, specific timeframe is unknown by patient.  He reports his last melanotic stool was approximately 2 days ago.  He has 2 bowel movements a day and they are loose.  He denies any iron supplementation or Pepto-Bismol usage.  He denies NSAID usage other than occasional ibuprofen.  He drinks approximately 1 quart of beer per day but denies liquor use. He denies any hematochezia.    Patient further denies dysphagia, heartburn, nausea, vomiting, abdominal pain, constipation, changes in appetite, or weight loss.  He denies family history of gastrointestinal malignancies or inflammatory bowel disease.  Last September, he was noted to be Hep C positive, with a viral quantitation of 3.7 million.  Patient's last colonoscopy was in September 2013 and showed diverticulosis as well  as moderate hemorrhoids.  He has never had an upper endoscopy.   Past Medical History:  Diagnosis Date  . Diabetes mellitus without complication ([place])   . Hypertension   . ICH (intracerebral hemorrhage) ([place]) 05/20/2017  . Shoulder pain, left 06/28/2013    Past Surgical History:  Procedure Laterality Date  . APPENDECTOMY    . COLONOSCOPY  01/23/2012   Procedure: COLONOSCOPY;  Surgeon: Danie Binder, MD;  Location: [facility];  Service: Endoscopy;  Laterality: N/A;  11:10 AM  . INCISION AND DRAINAGE ABSCESS N/A 06/29/2016   Procedure: INCISION AND DRAINAGE ABDOMINAL WALL ABSCESS;  Surgeon: Alphonsa Overall, MD;  Location: [facility] OR[facility];  Service: General;  Laterality: N/A;  . Left heel surgery      Prior to Admission medications   Medication Sig Start Date End Date Taking? Authorizing Provider  amLODipine (NORVASC) 10 MG tablet Take 1 tablet (10 mg total) by mouth daily. 06/23/19 06/22/20 Yes [name], NP  cloNIDine (CATAPRES) 0.2 MG tablet Take  1 tablet twice daily . If makes drowsy take 2 tablets - after he is off of work 06/23/19  Yes Edwards, Milford Cage, NP  folic acid (FOLVITE) 1 MG tablet Take 1 tablet (1 mg total) by mouth daily. 01/25/19  Yes Winfrey, Alcario Drought, MD  gabapentin (NEURONTIN) 300 MG capsule Patient takes 1 tablet twice a day and 2 tablets at night. Patient taking differently: Take 300-600 mg by mouth See admin instructions. Patient takes 1 capsule twice a day and 2 capsules (669m) at night.  06/23/19  Yes [name], NP  blood glucose meter kit and supplies KIT Dispense based on patient and insurance preference. Use up to four times daily as directed. (FOR ICD-9 250.00, 250.01). Patient not taking: Reported on 06/23/2019 07/01/16   Nita Sells, MD  ferrous sulfate 325 (65 FE) MG EC tablet Take 1 tablet (325 mg total) by mouth 2 (two) times daily for 60 doses. 03/31/19 04/30/19  Mitzi Hansen, MD  losartan (COZAAR) 25 MG tablet Take 1 tablet (25 mg total) by  mouth every morning. 06/23/19   [name], NP  vitamin B-12 (CYANOCOBALAMIN) 1000 MCG tablet Take 1 tablet (1,000 mcg total) by mouth daily. Patient not taking: Reported on 06/23/2019 03/31/19   Mitzi Hansen, MD    Current Facility-Administered Medications  Medication Dose Route Frequency Provider Last Rate Last Admin  . acetaminophen (TYLENOL) tablet 650 mg  650 mg Oral Q6H PRN Seawell, Jaimie A, DO       Or  . acetaminophen (TYLENOL) suppository 650 mg  650 mg Rectal Q6H PRN Seawell, Jaimie A, DO      . amLODipine (NORVASC) tablet 10 mg  10 mg Oral Daily Seawell, Jaimie A, DO      . azithromycin (ZITHROMAX) 250 mg in dextrose 5 % 125 mL IVPB  250 mg Intravenous Q24H Seawell, Jaimie A, DO      . cefTRIAXone (ROCEPHIN) 1 g in sodium chloride 0.9 % 100 mL IVPB  1 g Intravenous Q24H Seawell, Jaimie A, DO      . cloNIDine (CATAPRES) tablet 0.1 mg  0.1 mg Oral BID Seawell, Jaimie A, DO   0.1 mg at 06/27/19 2056  . folic acid (FOLVITE) tablet 1 mg  1 mg Oral Daily Seawell, Jaimie A, DO   1 mg at 06/27/19 1707  . gabapentin (NEURONTIN) capsule 300 mg  300 mg Oral QHS Seawell, Jaimie A, DO   300 mg at 06/27/19 2056  . pantoprazole (PROTONIX) injection 40 mg  40 mg Intravenous Q12H Seawell, Jaimie A, DO   40 mg at 06/27/19 2056  . thiamine tablet 100 mg  100 mg Oral Daily Seawell, Jaimie A, DO   100 mg at 06/27/19 1709    Allergies as of 06/27/2019  . (No Known Allergies)    Family History  Problem Relation Age of Onset  . Colon cancer Neg Hx     Social History   Socioeconomic History  . Marital status: Single    Spouse name: Not on file  . Number of children: Not on file  . Years of education: Not on file  . Highest education level: Not on file  Occupational History  . Not on file  Tobacco Use  . Smoking status: Current Every Day Smoker    Packs/day: 1.00    Years: 30.00    Pack years: 30.00    Types: Cigarettes  . Smokeless tobacco: Never Used  Substance and Sexual  Activity  . Alcohol use: Yes    Alcohol/week: 12.0 standard drinks    Types: 12 Cans of beer per week  . Drug use: Yes    Types: "Crack" cocaine  . Sexual activity: Yes    Birth control/protection: None  Other Topics Concern  . Not on file  Social History Narrative  . Not on file   Social Determinants of Health   Financial Resource Strain:   . Difficulty of Paying Living Expenses: Not on file  Food Insecurity:   . Worried About [employer] of  Food in the Last Year: Not on file  . Ran Out of Food in the Last Year: Not on file  Transportation Needs:   . Lack of Transportation (Medical): Not on file  . Lack of Transportation (Non-Medical): Not on file  Physical Activity:   . Days of Exercise per Week: Not on file  . Minutes of Exercise per Session: Not on file  Stress:   . Feeling of Stress : Not on file  Social Connections:   . Frequency of Communication with Friends and Family: Not on file  . Frequency of Social Gatherings with Friends and Family: Not on file  . Attends Religious Services: Not on file  . Active Member of Clubs or Organizations: Not on file  . Attends Archivist Meetings: Not on file  . Marital Status: Not on file  Intimate Partner Violence:   . Fear of Current or Ex-Partner: Not on file  . Emotionally Abused: Not on file  . Physically Abused: Not on file  . Sexually Abused: Not on file    Review of Systems:   Physical Exam: Vital signs in last 24 hours: Temp:  [97.9 F (36.6 C)-98.5 F (36.9 C)] 98 F (36.7 C) (03/02 0619) Pulse Rate:  [67-79] 72 (03/02 0619) Resp:  [16-24] 16 (03/02 0619) BP: (121-150)/(70-95) 121/74 (03/02 0619) SpO2:  [96 %-100 %] 98 % (03/02 0619) Weight:  [116.6 kg-120 kg] 120 kg (03/01 1637) Last BM Date: 06/26/19 General:  Alert, obese, cooperative in NAD Head: Normocephalic and atraumatic. Eyes: Sclera clear, no icterus Lungs: Diffuse rhonchi and expiratory wheezing noted on auscultation, especially in lower  lung fields. No evident respiratory distress. Heart: Regular rate and rhythm; no murmurs, clicks, rubs,  or gallops. Abdomen: Soft, nontender, and nondistended. No masses noted. Normal bowel sounds, without guarding or rebound.   Pulses: Normal radial pulses noted. Extremities: Minimal bilateral lower extremity edema. Neurologic: Alert and coherent;  grossly normal neurologically. Skin: Intact without significant lesions or rashes. Psych:  Alert and cooperative. Normal mood and affect.  Intake/Output from previous day: 03/01 0701 - 03/02 0700 In: 620 [I.V.:20; Blood:500; IV Piggyback:100] Out: 1050 [Urine:1050] Intake/Output this shift: No intake/output data recorded.  Lab Results: Recent Labs    06/27/19 1115 06/27/19 2357  WBC 5.7 5.2  HGB 6.4* 7.0*  HCT 20.2* 21.3*  PLT 128* 110*   BMET Recent Labs    06/27/19 1115 06/27/19 2027 06/27/19 2357  NA 140 143 141  K 3.5 3.6 3.6  CL 114* 118* 115*  CO2 15* 15* 16*  GLUCOSE 125* 199* 98  BUN 64* 63* 64*  CREATININE 7.02* 6.76* 6.79*  CALCIUM 8.0* 7.6* 7.9*   LFT Recent Labs    06/27/19 2357  PROT 6.1*  ALBUMIN 2.0*  AST 68*  ALT 28  ALKPHOS 39  BILITOT 0.1*   PT/INR Recent Labs    06/27/19 1115  LABPROT 13.0  INR 1.0    Studies/Results: DG Chest 2 View  Result Date: 06/27/2019 CLINICAL DATA:  Short of breath. EXAM: CHEST - 2 VIEW COMPARISON:  03/29/2019 FINDINGS: Mild cardiac enlargement. No pleural effusion identified. Left lower lobe airspace consolidation is identified. Right lung clear. 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 Followup PA and lateral chest X-ray is recommended in 3-4 weeks following trial of antibiotic therapy to ensure resolution and exclude underlying malignancy. Electronically Signed   By: Kerby Moors M.D.   On:  06/27/2019 12:19   CT Head Wo Contrast  Result Date:  06/27/2019 CLINICAL DATA:  Patient status post fall yesterday. Initial encounter. EXAM: CT HEAD WITHOUT CONTRAST TECHNIQUE: Contiguous axial images were obtained from the base of the skull through the vertex without intravenous contrast. COMPARISON:  Head CT scan 01/22/2019. FINDINGS: Brain: No evidence of acute infarction, hemorrhage, hydrocephalus, extra-axial collection or mass lesion/mass effect. Vascular: Atherosclerosis. Skull: Intact.  No focal lesion. Sinuses/Orbits: Minimal mucosal thickening in the maxillary sinuses is seen. Other: None. IMPRESSION: No acute abnormality. Atherosclerosis. Very mild mucosal thickening in the maxillary sinuses. Electronically Signed   By: Inge Rise M.D.   On: 06/27/2019 13:06   US RENAL  Result Date: 06/27/2019 CLINICAL DATA:  Acute renal injury with elevated BUN and creatinine EXAM: RENAL / URINARY TRACT ULTRASOUND COMPLETE COMPARISON:  None. FINDINGS: Right Kidney: Renal measurements: 9.6 x 5.2 x 5.4 cm. = volume: 140 mL. Mild increased echogenicity is noted. No mass lesion or hydronephrosis is seen. Left Kidney: Renal measurements: 11.4 x 6.0 x 6.0 cm. = volume: 212 mL. Mild increased echogenicity is noted. No focal mass or hydronephrosis is noted. Bladder: Appears normal for degree of bladder distention. Other: None. IMPRESSION: Mild increased echogenicity consistent with medical renal disease. No acute abnormality noted. Electronically Signed   By: Inez Catalina M.D.   On: 06/27/2019 22:37    Impression: 1.  Symptomatic anemia.  Suspect this is multifactorial in the setting of CKD.  Documented to be iron deficient as of 2 months ago.  However do suspect patient is having chronic GI blood loss as evidenced by intermittent melanotic stools and positive FOBT on admission.      Plan: 1.  EGD and colonoscopy is warranted; however, patient is currently stable post transfusion of 1 unit.  I believe it is best to wait until improvement in pneumonia after a few  days of antibiotics.  In the meantime, from our standpoint, patient has no contraindications to advancing diet. Have gone ahead and ordered diet since patient is hungry.  Given pt's h/o non-compliance with follow-up, we may want to do his GI studies prior to dischg (although pt current expresses a preference for doing them at a later time as outpt).  2. Continue to monitor daily H&H.  Transfuse as needed.  3.  Recommend checking B12 and folate levels, as patient has a history of B12 deficiency  4.  Continue PPI--ok to transition to oral.  5.  Pneumonia mgt per MTS  6.  ID Consult for treatment of Hep C  GI will follow.   LOS: 0 days   Youlanda Mighty Hershel Corkery  06/28/2019, 9:54 AM   Pager [phone] If no answer or after 5 PM call [phone]

## 2019-06-28 NOTE — Consult Note (Addendum)
Reason for Consult: Renal failure Referring Physician:  Dr. Ladona Horns  Chief Complaint: Dizziness  Assessment/Plan: 1. AKI on CKDIIIB - appears to be fairly rapidly progressing renal disease with proteinuria present but not an active sediment. No e/o obstruction on renal ultrasound but incr echogenicity. Preserved LV EF 60% 01/23/2019.  - Plan on sending serologies for this gentleman, urine studies, quantifying proteinuria. . - Fortunately no acute indication for RRT. - Will d/w pt as a renal  biopsy may be necessary for a definitive diagnosis.  - Avoid nephrotoxins and dose meds for GFR <20 ml/min - Start HCO 614m 1 tab PO BID 2. Anemia - seen by GI with h/o melena and FOBT +.  3. CAP  4. HTN -  Agree with holding the Losartan with the AKI.   HPI: Daniel DENNis an 59y.o. male 40PY smoking  HTN ETOH CKD4 presenting with dizziness for a day while walking up the stairs. He did fall but denied any trauma to the head of LOC. He also has a chronic cough +sputum and decreased urine output. In the ED his sats were 98% on RA with anemia (Hb 6.4) and cr of 7.02 where before the cr was 1.98 03/2018, 2.8 on 03/31/19 and as high as in the 4's in 2020; Cr 04/2017 was 1.4-1.6.   CXR noted a LLL PNA and he was treated with azithromycin and ceftriaxone. He denies f/c/ rashes, joint pain, NSAID's, illicit drug use; there is also no h/o ESRD in the family or sudden deaths from ruptured aneurysms. He also denies obstructive symptoms, hematuria, nephrolithiasis.   ROS Pertinent items are noted in HPI.  Chemistry and CBC: Creatinine, Ser  Date/Time Value Ref Range Status  06/27/2019 11:57 PM 6.79 (H) 0.61 - 1.24 mg/dL Final  06/27/2019 08:27 PM 6.76 (H) 0.61 - 1.24 mg/dL Final  06/27/2019 11:15 AM 7.02 (H) 0.61 - 1.24 mg/dL Final  03/31/2019 08:02 AM 2.85 (H) 0.61 - 1.24 mg/dL Final  03/30/2019 04:36 AM 2.73 (H) 0.61 - 1.24 mg/dL Final  03/29/2019 06:20 AM 2.80 (H) 0.61 - 1.24 mg/dL Final   01/27/2019 01:01 PM 3.29 (H) 0.61 - 1.24 mg/dL Final  01/24/2019 07:04 AM 2.69 (H) 0.61 - 1.24 mg/dL Final  01/23/2019 05:44 AM 3.25 (H) 0.61 - 1.24 mg/dL Final  01/22/2019 02:41 PM 4.08 (H) 0.61 - 1.24 mg/dL Final  10/21/2018 12:48 PM 2.54 (H) 0.61 - 1.24 mg/dL Final  09/20/2018 09:29 AM 2.39 (H) 0.61 - 1.24 mg/dL Final  04/06/2018 12:30 PM 1.98 (H) 0.61 - 1.24 mg/dL Final  05/27/2017 03:29 PM 1.62 (H) 0.61 - 1.24 mg/dL Final  05/25/2017 06:15 AM 1.41 (H) 0.61 - 1.24 mg/dL Final  05/24/2017 02:41 AM 1.37 (H) 0.61 - 1.24 mg/dL Final  05/23/2017 03:05 AM 1.40 (H) 0.61 - 1.24 mg/dL Final  05/22/2017 02:53 AM 1.59 (H) 0.61 - 1.24 mg/dL Final  05/20/2017 09:34 PM 1.49 (H) 0.61 - 1.24 mg/dL Final  07/01/2016 06:59 AM 1.20 0.61 - 1.24 mg/dL Final  06/30/2016 05:07 AM 1.26 (H) 0.61 - 1.24 mg/dL Final  06/29/2016 11:31 AM 1.13 0.61 - 1.24 mg/dL Final  06/29/2016 01:52 AM 1.24 0.61 - 1.24 mg/dL Final  06/28/2013 09:18 AM 1.18 0.50 - 1.35 mg/dL Final  05/17/2010 11:34 AM 0.87 0.4 - 1.5 mg/dL Final  12/31/2009 08:00 AM 0.9 0.4 - 1.5 mg/dL Final   Recent Labs  Lab 06/27/19 1115 06/27/19 2027 06/27/19 2357  NA 140 143 141  K 3.5 3.6 3.6  CL  114* 118* 115*  CO2 15* 15* 16*  GLUCOSE 125* 199* 98  BUN 64* 63* 64*  CREATININE 7.02* 6.76* 6.79*  CALCIUM 8.0* 7.6* 7.9*  PHOS  --   --  5.0*   Recent Labs  Lab 06/27/19 1115 06/27/19 2357  WBC 5.7 5.2  NEUTROABS 3.5 2.4  HGB 6.4* 7.0*  HCT 20.2* 21.3*  MCV 112.2* 106.5*  PLT 128* 110*   Liver Function Tests: Recent Labs  Lab 06/27/19 1115 06/27/19 2357  AST 69* 68*  ALT 31 28  ALKPHOS 39 39  BILITOT 0.3 0.1*  PROT 6.5 6.1*  ALBUMIN 2.2* 2.0*   No results for input(s): LIPASE, AMYLASE in the last 168 hours. No results for input(s): AMMONIA in the last 168 hours. Cardiac Enzymes: No results for input(s): CKTOTAL, CKMB, CKMBINDEX, TROPONINI in the last 168 hours. Iron Studies: No results for input(s): IRON, TIBC, TRANSFERRIN,  FERRITIN in the last 72 hours. PT/INR: @LABRCNTIP (inr:5)  Xrays/Other Studies: ) Results for orders placed or performed during the hospital encounter of 06/27/19 (from the past 48 hour(s))  Protime-INR     Status: None   Collection Time: 06/27/19 11:15 AM  Result Value Ref Range   Prothrombin Time 13.0 11.4 - 15.2 seconds   INR 1.0 0.8 - 1.2    Comment: (NOTE) INR goal varies based on device and disease states. Performed at [facility], [place] [address], [place], [place] [address]   APTT     Status: None   Collection Time: 06/27/19 11:15 AM  Result Value Ref Range   aPTT 33 24 - 36 seconds    Comment: Performed at [facility] [address] [place], [place] [address]  CBC     Status: Abnormal   Collection Time: 06/27/19 11:15 AM  Result Value Ref Range   WBC 5.7 4.0 - 10.5 K/uL   RBC 1.80 (L) 4.22 - 5.81 MIL/uL   Hemoglobin 6.4 (LL) 13.0 - 17.0 g/dL    Comment: REPEATED TO VERIFY THIS CRITICAL RESULT HAS VERIFIED AND BEEN CALLED TO J.PAYAN,RN BY PAMELA HENDERSON ON 03 01 2021 AT 1257, AND HAS BEEN READ BACK.     HCT 20.2 (L) 39.0 - 52.0 %   MCV 112.2 (H) 80.0 - 100.0 fL   MCH 35.6 (H) 26.0 - 34.0 pg   MCHC 31.7 30.0 - 36.0 g/dL   RDW 16.4 (H) 11.5 - 15.5 %   Platelets 128 (L) 150 - 400 K/uL   nRBC 0.7 (H) 0.0 - 0.2 %    Comment: Performed at [facility] [address] [place], [place] [address]  Differential     Status: Abnormal   Collection Time: 06/27/19 11:15 AM  Result Value Ref Range   Neutrophils Relative % 62 %   Neutro Abs 3.5 1.7 - 7.7 K/uL   Lymphocytes Relative 33 %   Lymphs Abs 1.9 0.7 - 4.0 K/uL   Monocytes Relative 3 %   Monocytes Absolute 0.2 0.1 - 1.0 K/uL   Eosinophils Relative 1 %   Eosinophils Absolute 0.1 0.0 - 0.5 K/uL   Basophils Relative 1 %   Basophils Absolute 0.1 0.0 - 0.1 K/uL   nRBC 2 (H) 0 /100 WBC   Abs Immature Granulocytes 0.00 0.00 - 0.07 K/uL    Comment: Performed at [facility] [address] [place], [place] [address]  Comprehensive metabolic panel     Status: Abnormal   Collection Time: 06/27/19 11:15  AM  Result Value Ref Range   Sodium 140 135 - 145 mmol/L   Potassium 3.5 3.5 - 5.1 mmol/L   Chloride 114 (H) 98 - 111 mmol/L   CO2 15 (L) 22 - 32 mmol/L   Glucose, Bld 125 (H) 70 - 99 mg/dL    Comment: Glucose reference range applies only to samples taken after fasting for at least 8 hours.   BUN 64 (H) 6 - 20 mg/dL   Creatinine, Ser 7.02 (H) 0.61 - 1.24 mg/dL   Calcium 8.0 (L) 8.9 - 10.3 mg/dL   Total Protein 6.5 6.5 - 8.1 g/dL   Albumin 2.2 (L) 3.5 - 5.0 g/dL   AST 69 (H) 15 - 41 U/L   ALT 31 0 - 44 U/L   Alkaline Phosphatase 39 38 - 126 U/L   Total Bilirubin 0.3 0.3 - 1.2 mg/dL   GFR calc non Af Amer 8 (L) >60 mL/min   GFR calc Af Amer 9 (L) >60 mL/min   Anion gap 11 5 - 15    Comment: Performed at [facility] [address], [place], [place] [address]  POC CBG, ED     Status: Abnormal   Collection Time: 06/27/19 11:38 AM  Result Value Ref Range   Glucose-Capillary 122 (H) 70 - 99 mg/dL    Comment: Glucose reference range applies only to samples taken after fasting for at least 8 hours.  Prepare RBC     Status: None   Collection Time: 06/27/19  1:30 PM  Result Value Ref Range   Order Confirmation      ORDER PROCESSED BY BLOOD BANK Performed at [facility], [place] [address] [place], [place] [address]   Type and screen [place]     Status: None   Collection Time: 06/27/19  1:30 PM  Result Value Ref Range   ABO/RH(D) B POS    Antibody Screen NEG    Sample Expiration 06/30/2019,2359    Unit Number [id number]    Blood Component Type RED CELLS,LR    Unit division 00    Status of Unit ISSUED,FINAL    Transfusion Status OK TO TRANSFUSE    Crossmatch Result      Compatible Performed at [facility], [place] [address] [place],  [address]   POC occult blood, ED     Status: Abnormal   Collection Time: 06/27/19  1:41  PM  Result Value Ref Range   Fecal Occult Bld POSITIVE (A) NEGATIVE  Respiratory Panel by RT PCR (Flu A&B, Covid) - Nasopharyngeal Swab     Status: None   Collection Time: 06/27/19  1:47 PM   Specimen: Nasopharyngeal Swab  Result Value Ref Range   SARS Coronavirus 2 by RT PCR NEGATIVE NEGATIVE    Comment: (NOTE) SARS-CoV-2 target nucleic acids are NOT DETECTED. The SARS-CoV-2 RNA is generally detectable in upper respiratoy specimens during the acute phase of infection. The lowest concentration of SARS-CoV-2 viral copies this assay can detect is 131 copies/mL. A negative result does not preclude SARS-Cov-2 infection and should not be used as the sole basis for treatment or other patient management decisions. A negative result may occur with  improper specimen collection/handling, submission of specimen other than nasopharyngeal swab, presence of viral mutation(s) within the areas targeted by this assay, and inadequate number of viral copies (<131 copies/mL). A negative result must be combined with clinical observations, patient history, and epidemiological information. The expected result is Negative.  Fact Sheet for Patients:  PinkCheek.be Fact Sheet for Healthcare Providers:  GravelBags.it This test is not yet ap proved or cleared by the [place] FDA and  has been authorized for detection and/or diagnosis of SARS-CoV-2 by FDA under an Emergency Use Authorization (EUA). This EUA will remain  in effect (meaning this test can be used) for the duration of the COVID-19 declaration under Section 564(b)(1) of the Act, 21 U.S.C. section 360bbb-3(b)(1), unless the authorization is terminated or revoked sooner.    Influenza A by PCR NEGATIVE NEGATIVE   Influenza B by PCR NEGATIVE NEGATIVE    Comment: (NOTE) The Xpert Xpress SARS-CoV-2/FLU/RSV assay is intended as an aid in  the diagnosis of influenza from Nasopharyngeal swab  specimens and  should not be used as a sole basis for treatment. Nasal washings and  aspirates are unacceptable for Xpert Xpress SARS-CoV-2/FLU/RSV  testing. Fact Sheet for Patients: PinkCheek.be Fact Sheet for Healthcare Providers: GravelBags.it This test is not yet approved or cleared by the [place] FDA and  has been authorized for detection and/or diagnosis of SARS-CoV-2 by  FDA under an Emergency Use Authorization (EUA). This EUA will remain  in effect (meaning this test can be used) for the duration of the  Covid-19 declaration under Section 564(b)(1) of the Act, 21  U.S.C. section 360bbb-3(b)(1), unless the authorization is  terminated or revoked. Performed at [facility], [place] [address], [place], [place] [address]   Urinalysis, Routine w reflex microscopic     Status: Abnormal   Collection Time: 06/27/19  3:51 PM  Result Value Ref Range   Color, Urine STRAW (A) YELLOW   APPearance CLEAR CLEAR   Specific Gravity, Urine 1.009 1.005 - 1.030   pH 5.0 5.0 - 8.0   Glucose, UA NEGATIVE NEGATIVE mg/dL   Hgb urine dipstick SMALL (A) NEGATIVE   Bilirubin Urine NEGATIVE NEGATIVE   Ketones, ur NEGATIVE NEGATIVE mg/dL   Protein, ur 100 (A) NEGATIVE mg/dL   Nitrite NEGATIVE NEGATIVE   Leukocytes,Ua NEGATIVE NEGATIVE   RBC / HPF 0-5 0 - 5 RBC/hpf   WBC, UA 0-5 0 - 5 WBC/hpf   Bacteria, UA NONE SEEN NONE SEEN    Comment: Performed at [facility] [address], [place], [place] [address]  Basic metabolic panel     Status: Abnormal   Collection Time: 06/27/19  8:27 PM  Result Value Ref Range   Sodium 143 135 - 145 mmol/L   Potassium 3.6 3.5 - 5.1 mmol/L   Chloride 118 (H) 98 - 111 mmol/L   CO2 15 (L) 22 - 32 mmol/L   Glucose, Bld 199 (H) 70 - 99 mg/dL    Comment: Glucose reference range applies only to samples taken after fasting for at least 8 hours.   BUN 63 (H) 6 - 20 mg/dL   Creatinine, Ser 6.76 (H)  0.61 - 1.24 mg/dL   Calcium 7.6 (L) 8.9 - 10.3 mg/dL   GFR calc non Af Amer 8 (L) >60 mL/min   GFR calc Af Amer 9 (L) >60 mL/min   Anion gap 10 5 - 15    Comment: Performed at [facility] [address] [place], [place] [address]  Lactic acid, plasma     Status: None   Collection Time: 06/27/19  8:27 PM  Result Value Ref Range   Lactic Acid, Venous 1.2 0.5 - 1.9 mmol/L    Comment: Performed at [facility]  [address] [place], [place] [address]  Magnesium     Status: None   Collection Time: 06/27/19 11:57 PM  Result Value Ref Range   Magnesium 1.9 1.7 - 2.4 mg/dL    Comment: Performed at [facility], [place] [address], [place], [place] [address]  Phosphorus     Status: Abnormal   Collection Time: 06/27/19 11:57 PM  Result Value Ref Range   Phosphorus 5.0 (H) 2.5 - 4.6 mg/dL    Comment: Performed at [facility] [address] [place], [place] [address]  Comprehensive metabolic panel     Status: Abnormal   Collection Time: 06/27/19 11:57 PM  Result Value Ref Range   Sodium 141 135 - 145 mmol/L   Potassium 3.6 3.5 - 5.1 mmol/L   Chloride 115 (H) 98 - 111 mmol/L   CO2 16 (L) 22 - 32 mmol/L   Glucose, Bld 98 70 - 99 mg/dL    Comment: Glucose reference range applies only to samples taken after fasting for at least 8 hours.   BUN 64 (H) 6 - 20 mg/dL   Creatinine, Ser 6.79 (H) 0.61 - 1.24 mg/dL   Calcium 7.9 (L) 8.9 - 10.3 mg/dL   Total Protein 6.1 (L) 6.5 - 8.1 g/dL   Albumin 2.0 (L) 3.5 - 5.0 g/dL   AST 68 (H) 15 - 41 U/L   ALT 28 0 - 44 U/L   Alkaline Phosphatase 39 38 - 126 U/L   Total Bilirubin 0.1 (L) 0.3 - 1.2 mg/dL   GFR calc non Af Amer 8 (L) >60 mL/min   GFR calc Af Amer 9 (L) >60 mL/min   Anion gap 10 5 - 15    Comment: Performed at [facility], [place] [address], [place], [place] [address]  CBC with Differential/Platelet     Status: Abnormal   Collection Time: 06/27/19 11:57 PM  Result Value Ref Range   WBC 5.2 4.0 - 10.5 K/uL    RBC 2.00 (L) 4.22 - 5.81 MIL/uL   Hemoglobin 7.0 (L) 13.0 - 17.0 g/dL   HCT 21.3 (L) 39.0 - 52.0 %   MCV 106.5 (H) 80.0 - 100.0 fL   MCH 35.0 (H) 26.0 - 34.0 pg   MCHC 32.9 30.0 - 36.0 g/dL   RDW 18.2 (H) 11.5 - 15.5 %   Platelets 110 (L) 150 - 400 K/uL    Comment: REPEATED TO VERIFY PLATELET COUNT CONFIRMED BY SMEAR SPECIMEN CHECKED FOR CLOTS Immature Platelet Fraction may be clinically indicated, consider ordering this additional test [id number]    nRBC 0.6 (H) 0.0 - 0.2 %   Neutrophils Relative % 46 %   Neutro Abs 2.4 1.7 - 7.7 K/uL   Lymphocytes Relative 43 %   Lymphs Abs 2.3 0.7 - 4.0 K/uL   Monocytes Relative 9 %   Monocytes Absolute 0.5 0.1 - 1.0 K/uL   Eosinophils Relative 1 %   Eosinophils Absolute 0.1 0.0 - 0.5 K/uL   Basophils Relative 0 %   Basophils Absolute 0.0 0.0 - 0.1 K/uL   Immature Granulocytes 1 %   Abs Immature Granulocytes 0.03 0.00 - 0.07 K/uL    Comment: Performed at [facility], [place] [address], [place], [place] [address]  Lactic acid, plasma     Status: None   Collection Time: 06/27/19 11:57 PM  Result Value Ref Range   Lactic Acid, Venous 0.8 0.5 - 1.9 mmol/L    Comment: Performed at [facility],  [address], [place], [place] [address]   DG Chest 2 View  Result Date: 06/27/2019 CLINICAL DATA:  Short of breath. EXAM: CHEST - 2 VIEW COMPARISON:  03/29/2019 FINDINGS: Mild cardiac enlargement. No pleural effusion identified. Left lower lobe airspace consolidation is identified. Right lung clear. 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 Followup PA and lateral chest X-ray is recommended in 3-4 weeks following trial of antibiotic therapy to ensure resolution and exclude underlying malignancy. Electronically Signed   By: Kerby Moors M.D.   On: 06/27/2019 12:19   CT Head Wo Contrast  Result Date: 06/27/2019 CLINICAL DATA:  Patient status  post fall yesterday. Initial encounter. EXAM: CT HEAD WITHOUT CONTRAST TECHNIQUE: Contiguous axial images were obtained from the base of the skull through the vertex without intravenous contrast. COMPARISON:  Head CT scan 01/22/2019. FINDINGS: Brain: No evidence of acute infarction, hemorrhage, hydrocephalus, extra-axial collection or mass lesion/mass effect. Vascular: Atherosclerosis. Skull: Intact.  No focal lesion. Sinuses/Orbits: Minimal mucosal thickening in the maxillary sinuses is seen. Other: None. IMPRESSION: No acute abnormality. Atherosclerosis. Very mild mucosal thickening in the maxillary sinuses. Electronically Signed   By: Inge Rise M.D.   On: 06/27/2019 13:06   US RENAL  Result Date: 06/27/2019 CLINICAL DATA:  Acute renal injury with elevated BUN and creatinine EXAM: RENAL / URINARY TRACT ULTRASOUND COMPLETE COMPARISON:  None. FINDINGS: Right Kidney: Renal measurements: 9.6 x 5.2 x 5.4 cm. = volume: 140 mL. Mild increased echogenicity is noted. No mass lesion or hydronephrosis is seen. Left Kidney: Renal measurements: 11.4 x 6.0 x 6.0 cm. = volume: 212 mL. Mild increased echogenicity is noted. No focal mass or hydronephrosis is noted. Bladder: Appears normal for degree of bladder distention. Other: None. IMPRESSION: Mild increased echogenicity consistent with medical renal disease. No acute abnormality noted. Electronically Signed   By: Inez Catalina M.D.   On: 06/27/2019 22:37    PMH:   Past Medical History:  Diagnosis Date  . Diabetes mellitus without complication ([place])   . Hypertension   . ICH (intracerebral hemorrhage) ([place]) 05/20/2017  . Shoulder pain, left 06/28/2013    PSH:   Past Surgical History:  Procedure Laterality Date  . APPENDECTOMY    . COLONOSCOPY  01/23/2012   Procedure: COLONOSCOPY;  Surgeon: Danie Binder, MD;  Location: [facility]ITE;  Service: Endoscopy;  Laterality: N/A;  11:10 AM  . INCISION AND DRAINAGE ABSCESS N/A 06/29/2016   Procedure: INCISION AND  DRAINAGE ABDOMINAL WALL ABSCESS;  Surgeon: Alphonsa Overall, MD;  Location: [facility] ORS;  Service: General;  Laterality: N/A;  . Left heel surgery      Allergies: No Known Allergies  Medications:   Prior to Admission medications   Medication Sig Start Date End Date Taking? Authorizing Provider  amLODipine (NORVASC) 10 MG tablet Take 1 tablet (10 mg total) by mouth daily. 06/23/19 06/22/20 Yes [name], NP  cloNIDine (CATAPRES) 0.2 MG tablet Take  1 tablet twice daily . If makes drowsy take 2 tablets - after he is off of work 06/23/19  Yes Edwards, Milford Cage, NP  folic acid (FOLVITE) 1 MG tablet Take 1 tablet (1 mg total) by mouth daily. 01/25/19  Yes Winfrey, Alcario Drought, MD  gabapentin (NEURONTIN) 300 MG capsule Patient takes 1 tablet twice a day and 2 tablets at night. Patient taking differently: Take 300-600 mg by mouth See admin instructions. Patient takes 1 capsule twice a day  and 2 capsules (685m) at night. 06/23/19  Yes EKerin Perna NP  blood glucose meter kit and supplies KIT Dispense based on patient and insurance preference. Use up to four times daily as directed. (FOR ICD-9 250.00, 250.01). Patient not taking: Reported on 06/23/2019 07/01/16   SNita Sells MD  ferrous sulfate 325 (65 FE) MG EC tablet Take 1 tablet (325 mg total) by mouth 2 (two) times daily for 60 doses. 03/31/19 04/30/19  CMitzi Hansen MD  losartan (COZAAR) 25 MG tablet Take 1 tablet (25 mg total) by mouth every morning. 06/23/19   EKerin Perna NP  vitamin B-12 (CYANOCOBALAMIN) 1000 MCG tablet Take 1 tablet (1,000 mcg total) by mouth daily. Patient not taking: Reported on 06/23/2019 03/31/19   CMitzi Hansen MD    Discontinued Meds:   Medications Discontinued During This Encounter  Medication Reason  . sodium chloride 0.9 % bolus 500 mL     Social History:  reports that he has been smoking cigarettes. He has a 30.00 pack-year smoking history. He has never used smokeless tobacco. He reports  current alcohol use of about 12.0 standard drinks of alcohol per week. He reports current drug use. Drug: "Crack" cocaine.  Family History:   Family History  Problem Relation Age of Onset  . Colon cancer Neg Hx     Blood pressure 121/74, pulse 72, temperature 98 F (36.7 C), temperature source Oral, resp. rate 16, height 5' 9"  (1.753 m), weight 120 kg, SpO2 98 %. General appearance: alert, cooperative and appears stated age Head: Normocephalic, without obvious abnormality, atraumatic Eyes: negative Neck: no adenopathy, no carotid bruit, supple, symmetrical, trachea midline and thyroid not enlarged, symmetric, no tenderness/mass/nodules Back: symmetric, no curvature. ROM normal. No CVA tenderness. Resp: clear to auscultation bilaterally Cardio: S1, S2 normal GI: soft, non-tender; bowel sounds normal; no masses,  no organomegaly Extremities: edema tr Pulses: 2+ and symmetric Skin: Skin color, texture, turgor normal. No rashes or lesions Lymph nodes: Cervical adenopathy: none Neurologic: Grossly normal       Gerldine Suleiman, JHunt Oris MD 06/28/2019, 9:11 AM

## 2019-06-28 NOTE — Progress Notes (Signed)
Subjective:   He is still feeling weak but feels much more improved compared to yesterday after receiving blood. He had no dizziness when he sat up today. He is urinating ok. Denies chest pain, shortness of breath, or abdominal pain. Pt has not had a bowel movement yet.  Endorses having long standing hypertension, but cannot recall how long. His brother also has a history of hypertension. No family members have hx of bleeding or renal disease that he knows of.   Objective:  Vital signs in last 24 hours: Vitals:   06/27/19 1637 06/27/19 1848 06/27/19 2349 06/28/19 0619  BP: (!) 147/87 (!) 150/84 132/85 121/74  Pulse: 75 79 67 72  Resp: 18 (!) 22 18 16   Temp: 97.9 F (36.6 C) 98 F (36.7 C) 98.5 F (36.9 C) 98 F (36.7 C)  TempSrc: Oral Oral Oral Oral  SpO2: 96% 98% 99% 98%  Weight: 120 kg     Height: 5\' 9"  (1.753 m)      Physical Exam Vitals and nursing note reviewed.  Constitutional:      General: He is not in acute distress.    Appearance: He is not ill-appearing.     Comments: Pt resting comfortably in bed this morning. Well appearing though tired.  Cardiovascular:     Rate and Rhythm: Normal rate and regular rhythm.     Heart sounds: Normal heart sounds.  Pulmonary:     Effort: Pulmonary effort is normal.     Comments: Crackles in left lower lobe. Abdominal:     General: Abdomen is flat. Bowel sounds are normal.     Palpations: Abdomen is soft.  Musculoskeletal:     Right lower leg: No edema.     Left lower leg: No edema.  Neurological:     Mental Status: He is alert.    Assessment/Plan:  Active Problems:   Upper GI bleed  Daniel [name] is a 59yo male with PMH hypertension, CKD Stage IV, mild alcohol use disorder, and B12 deficiency, who presents of a 1 day history of dizziness and generalized weakness and was found to have a Hgb of 6.4 consistent with symptomatic anemia. Also noted to have acute renal failure and a left lower lobe pneumonia on  additional work-up.  Symptomatic anemia Upper GI Bleed Hemoglobin on admission was 6.4, with prior baseline around 7.6. Months long history of black stools and FOBT positive in the ED. Anemia likely multifactorial given superimposed chronic renal disease and dietary deficiencies (Vitamin B12 and Fe deficiency) but acutely worsened due to slow blood loss. S/p 1unit pRBCs on 3/1. Pt remains hemodynamically stable. - Hgb 7.0 this morning - pt symptoms improved after blood transfusion - continue to monitor with daily CBC - will restart B12 supplementation (pt was not taking outpatient), given history of low-normal B12 on 03/29/2019 - folate on 03/29/2019 was normal at 10.2  GI consulted, appreciate recommendations - endoscopy/colonoscopy warranted - will do inpatient given prior issues with follow-up - renal diet today - plan for colonoscopy prep tomorrow, and scopes on Thursday - transition PPI to oral  AKI on CKD Cr on admission was 7.02, with prior baseline around 2.8. Is not established with nephrology outpatient. - pt made 1L urine outpatient yesterday - UA with 100 protein and small Hgb - microalbumin/creatinine ratio pending - strict I/Os  Nephrology consulted, appreciate recommendations - appears to be rapidly progressive renal disease - increased echogenicity on ultrasound, without obstruction - no indication for dialysis at this time -  sending serologies, urine studies, and quantifying proteinuria - start bicarb 650mg  1 tab PO BID - avoid nephrotoxic medications, holding home losartan   Community acquired pneumonia  Pt is virtually asymptomatic, possible incidental finding on CXR with left lower lobe pneumonia. Pt states he is feeling well, denies dyspnea. Afebrile and no leukocytosis. - day 2 of 5 of CAP coverge with azithromycin and ceftriaxone  - radiology recommending follow-up PA and lateral CXR in 3-4 weeks after antibiotic therapy  HTN BP well controlled this  morning 121/74. - continue home clonidine 0.1mg  BID and amlodipine 10mg   Mild EtOH use Pt drinking one 40oz beer daily. Denies history of withdrawal symptoms or seizures.  - continue home folate and thiamine    Diet: renal diet VTE: SCDs IVF: none Code: FULL  Prior to Admission Living Arrangement: home Anticipated Discharge Location: home Barriers to Discharge: clinical improvement Dispo: Anticipated discharge in approximately 2-3 day(s).   Ladona Horns, MD 06/28/2019, 7:33 AM Pager: [phone]

## 2019-06-29 ENCOUNTER — Inpatient Hospital Stay (HOSPITAL_COMMUNITY): Payer: Self-pay

## 2019-06-29 DIAGNOSIS — D649 Anemia, unspecified: Secondary | ICD-10-CM

## 2019-06-29 LAB — GLOMERULAR BASEMENT MEMBRANE ANTIBODIES: GBM Ab: 3 units (ref 0–20)

## 2019-06-29 LAB — PROTEIN ELECTROPHORESIS, SERUM
A/G Ratio: 0.7 (ref 0.7–1.7)
Albumin ELP: 2.7 g/dL — ABNORMAL LOW (ref 2.9–4.4)
Alpha-1-Globulin: 0.2 g/dL (ref 0.0–0.4)
Alpha-2-Globulin: 0.7 g/dL (ref 0.4–1.0)
Beta Globulin: 1.1 g/dL (ref 0.7–1.3)
Gamma Globulin: 1.7 g/dL (ref 0.4–1.8)
Globulin, Total: 3.8 g/dL (ref 2.2–3.9)
Total Protein ELP: 6.5 g/dL (ref 6.0–8.5)

## 2019-06-29 LAB — RENAL FUNCTION PANEL
Albumin: 2 g/dL — ABNORMAL LOW (ref 3.5–5.0)
Anion gap: 11 (ref 5–15)
BUN: 64 mg/dL — ABNORMAL HIGH (ref 6–20)
CO2: 16 mmol/L — ABNORMAL LOW (ref 22–32)
Calcium: 7.9 mg/dL — ABNORMAL LOW (ref 8.9–10.3)
Chloride: 112 mmol/L — ABNORMAL HIGH (ref 98–111)
Creatinine, Ser: 7.06 mg/dL — ABNORMAL HIGH (ref 0.61–1.24)
GFR calc Af Amer: 9 mL/min — ABNORMAL LOW (ref >60)
GFR calc non Af Amer: 8 mL/min — ABNORMAL LOW (ref >60)
Glucose, Bld: 207 mg/dL — ABNORMAL HIGH (ref 70–99)
Phosphorus: 5.2 mg/dL — ABNORMAL HIGH (ref 2.5–4.6)
Potassium: 3.4 mmol/L — ABNORMAL LOW (ref 3.5–5.1)
Sodium: 139 mmol/L (ref 135–145)

## 2019-06-29 LAB — CBC
HCT: 22.3 % — ABNORMAL LOW (ref 39.0–52.0)
Hemoglobin: 7.3 g/dL — ABNORMAL LOW (ref 13.0–17.0)
MCH: 35.1 pg — ABNORMAL HIGH (ref 26.0–34.0)
MCHC: 32.7 g/dL (ref 30.0–36.0)
MCV: 107.2 fL — ABNORMAL HIGH (ref 80.0–100.0)
Platelets: 110 10*3/uL — ABNORMAL LOW (ref 150–400)
RBC: 2.08 MIL/uL — ABNORMAL LOW (ref 4.22–5.81)
RDW: 18.6 % — ABNORMAL HIGH (ref 11.5–15.5)
WBC: 4.8 10*3/uL (ref 4.0–10.5)
nRBC: 0 % (ref 0.0–0.2)

## 2019-06-29 LAB — FERRITIN: Ferritin: 47 ng/mL (ref 24–336)

## 2019-06-29 LAB — C3 COMPLEMENT: C3 Complement: 103 mg/dL (ref 82–167)

## 2019-06-29 LAB — ANCA TITERS
Atypical P-ANCA titer: <1:20 {titer}
C-ANCA: <1:20 {titer}
P-ANCA: <1:20 {titer}

## 2019-06-29 LAB — IRON AND TIBC
Iron: 65 ug/dL (ref 45–182)
Saturation Ratios: 17 % — ABNORMAL LOW (ref 17.9–39.5)
TIBC: 379 ug/dL (ref 250–450)
UIBC: 314 ug/dL

## 2019-06-29 LAB — ANTI-DNA ANTIBODY, DOUBLE-STRANDED: ds DNA Ab: 3 IU/mL (ref 0–9)

## 2019-06-29 LAB — C4 COMPLEMENT: Complement C4, Body Fluid: 21 mg/dL (ref 12–38)

## 2019-06-29 LAB — ANTINUCLEAR ANTIBODIES, IFA: ANA Ab, IFA: NEGATIVE

## 2019-06-29 MED ORDER — CLONIDINE HCL 0.2 MG PO TABS
0.2000 mg | ORAL_TABLET | Freq: Two times a day (BID) | ORAL | Status: DC
  Administered 2019-06-29 – 2019-07-01 (×5): 0.2 mg via ORAL
  Filled 2019-06-29 (×5): qty 1

## 2019-06-29 MED ORDER — PEG 3350-KCL-NA BICARB-NACL 420 G PO SOLR
4000.0000 mL | Freq: Once | ORAL | Status: AC
  Administered 2019-06-29: 14:00:00 4000 mL via ORAL
  Filled 2019-06-29: qty 4000

## 2019-06-29 MED ORDER — LINACLOTIDE 145 MCG PO CAPS
290.0000 ug | ORAL_CAPSULE | Freq: Once | ORAL | Status: AC
  Administered 2019-06-29: 09:00:00 290 ug via ORAL
  Filled 2019-06-29: qty 2

## 2019-06-29 MED ORDER — SODIUM CHLORIDE 0.9 % IV SOLN: INTRAVENOUS | Status: DC

## 2019-06-29 MED ORDER — AZITHROMYCIN 250 MG PO TABS
250.0000 mg | ORAL_TABLET | Freq: Every day | ORAL | Status: AC
  Administered 2019-06-29 – 2019-07-01 (×3): 250 mg via ORAL
  Filled 2019-06-29 (×3): qty 1

## 2019-06-29 MED ORDER — SODIUM CHLORIDE 0.9 % IV SOLN
510.0000 mg | Freq: Once | INTRAVENOUS | Status: AC
  Administered 2019-06-29: 11:00:00 510 mg via INTRAVENOUS
  Filled 2019-06-29: qty 17

## 2019-06-29 NOTE — Progress Notes (Signed)
Subjective: Pt seen at the bedside this morning. States he is feeling well. Denies any additional episodes of dizziness or weakness. Endorses a black soft bowel movement this morning, unchanged from prior BMs at home. Denies chest pain or shortness of breath.  Pt seen by GI this morning. Agreeable to plan for endoscopy and colonoscopy tomorrow.  Objective:  Vital signs in last 24 hours: Vitals:   06/28/19 1900 06/29/19 0007 06/29/19 0500 06/29/19 0531  BP: (!) 149/82 (!) 153/78  (!) 158/89  Pulse: 80 67  72  Resp: 18 17  16   Temp: 97.8 F (36.6 C) 99 F (37.2 C)  (!) 97.5 F (36.4 C)  TempSrc: Oral Oral  Oral  SpO2: 98% 99%  97%  Weight:   117.4 kg   Height:       Physical Exam Constitutional:      General: He is not in acute distress.    Appearance: Normal appearance. He is not ill-appearing.     Comments: Pt resting comfortably in bed.  Cardiovascular:     Rate and Rhythm: Normal rate and regular rhythm.     Heart sounds: Normal heart sounds.  Pulmonary:     Effort: Pulmonary effort is normal.     Comments: Course crackles in the left lower lobe. Breath sounds elsewhere normal.  Abdominal:     General: Abdomen is flat. Bowel sounds are normal. There is distension (secondary to body habitus).     Palpations: Abdomen is soft.  Neurological:     Mental Status: He is alert.    Assessment/Plan:  Active Problems:   Upper GI bleed   GI bleed   [name] is a 59yo male with PMH hypertension, CKD Stage IV, mildalcohol usedisorder, and B12 deficiency, who presents of a 1 day history of dizziness and generalized weakness and was found to have a Hgb of 6.4 consistent with symptomatic anemia. Also noted to have acute renal failure and a left lower lobe pneumonia on additional work-up.  Symptomatic anemia - resolved UpperGI Bleed Hemoglobinon admission was6.4, with prior baseline around 7.6.Months long history of black stools and FOBT positive in the ED.  Anemia likely multifactorial given superimposed chronic renal disease and dietary deficiencies (Vitamin B12 and Fe deficiency) but acutely worsened due to slow blood loss. S/p 1unit pRBCs on 3/1.  - Hgb 7.3 today - continue to monitor with daily CBC - B12 supplementation - Ferritin on low end of normal (47), Iron 65, TIBC 379, and saturation ratio low at 17  - IV iron transfusion today  GI consulted, appreciate recommendations - endoscopy/colonoscopy scheduled for tomorrow 3/3 - clear liquid diet, NPO at midnight - NuLytely prep this afternoon  AKI on CKD Cr on admission was 7.02, with prior baseline around 2.8.  - making urine outpatient (1L/day for last two days) - strict I/Os - UA with 100 protein and small Hgb - microalbumin/creatinine ratio elevated to 1,750  Nephrology consulted, appreciate recommendations - appears to be rapidly progressive renal disease without indication for dialysis at this time - plan for renal biopsy for definitive diagnosis on Friday 3/5 given rapid progression and significant proteinuria - serology results - HCV positive (known history), normal complement, HBV negative, GBM antibody negative - SPEP, ANCA, ANA, and anti-DNA antibiody pending - bicarb 650mg  1 tab PO BID - avoid nephrotoxic medications, holding home losartan   Community acquired pneumonia  Pt remains asymptomatic. Afebrile and no leukocytosis. - day 3 of 5 of CAP coverge with azithromycin  and ceftriaxone  - given pt's lack of clinical symptoms or evidence for pneumonia, will obtain chest CT today to further evaluate the opacity seen in pt's LLL on CXR  HTN BP mildly elevated today 150s/80s. Asymptomatic. - continue to monitor  - continue home clonidine 0.1mg  BID and amlodipine 10mg  - holding losartan and HCTZ given renal function  Mild EtOH use Pt drinking one 40oz beer daily. Denies history of withdrawal symptoms or seizures.  - continue home folate and  thiamine  Diet:renal diet AOZ:HYQM VHQ:IONG Code:FULL  Prior to Admission Living Arrangement: home Anticipated Discharge Location: home Barriers to Discharge: clinical improvement Dispo: Anticipated discharge in approximately 3-4 day(s) after endoscopy and renal biopsy procedures.   Ladona Horns, MD 06/29/2019, 6:27 AM Pager: [phone]

## 2019-06-29 NOTE — Progress Notes (Signed)
[facility] Progress Note   59 y.o. male 40PY smoking  HTN ETOH CKD4 presenting with dizziness for a day while walking up the stairs. He did fall but denied any trauma to the head of LOC. He also has a chronic cough +sputum and decreased urine output. In the ED his sats were 98% on RA with anemia (Hb 6.4) and cr of 7.02 where before the cr was 1.98 03/2018, 2.8 on 03/31/19 and as high as in the 4's in 2020; Cr 04/2017 was 1.4-1.6.   CXR noted a LLL PNA and he was treated with azithromycin and ceftriaxone. He denies f/c/ rashes, joint pain, NSAID's, illicit drug use; there is also no h/o ESRD in the family or sudden deaths from ruptured aneurysms. He also denies obstructive symptoms, hematuria, nephrolithiasis.   Assessment/ Plan:   1. AKI on CKDIIIB - appears to be fairly rapidly progressing renal disease with proteinuria present but not an active sediment. No e/o obstruction on renal ultrasound but incr echogenicity. Preserved LV EF 60% 01/23/2019.  - Sending serologies for this gentleman, urine studies, quantifying proteinuria. .C3C4 nl - Fortunately no acute indication for RRT. - Will d/w pt as a renal  biopsy may be necessary for a definitive diagnosis -> plan for biopsy on Fri as he is going for scope thur. May have progressed to ESRD but fairly rapid and has significant proteinuria. Kidneys are still decent sized so hopefully there is salvageable tissue  - Avoid nephrotoxins and dose meds for GFR <20 ml/min - Start HCO 650mg  1 tab PO BID 2. Anemia - seen by GI with h/o melena and FOBT +. Fereheme 510mg  ordered 3. CAP  4. HTN -  Agree with holding the Losartan with the AKI.   Subjective:   Denies f/c/n/v/ anorexia. No events overnight.   Objective:   BP (!) 158/89 (BP Location: Right Arm)   Pulse 72   Temp (!) 97.5 F (36.4 C) (Oral)   Resp 16   Ht 5\' 9"  (1.753 m)   Wt 117.4 kg   SpO2 97%   BMI 38.23 kg/m   Intake/Output Summary (Last 24 hours) at 06/29/2019  0934 Last data filed at 06/29/2019 0540 Gross per 24 hour  Intake 1745 ml  Output 1100 ml  Net 645 ml   Weight change: 0.862 kg  Physical Exam: General: NCAT, NAD Resp: clear to auscultation bilaterally Cardio: S1, S2 normal GI: soft, non-tender; bowel sounds normal; no masses,  no organomegaly Extremities: edema tr Pulses: 2+ and symmetric Neurologic: Grossly normal  Imaging: DG Chest 2 View  Result Date: 06/27/2019 CLINICAL DATA:  Short of breath. EXAM: CHEST - 2 VIEW COMPARISON:  03/29/2019 FINDINGS: Mild cardiac enlargement. No pleural effusion identified. Left lower lobe airspace consolidation is identified. Right lung clear. 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 Followup PA and lateral chest X-ray is recommended in 3-4 weeks following trial of antibiotic therapy to ensure resolution and exclude underlying malignancy. Electronically Signed   By: Kerby Moors M.D.   On: 06/27/2019 12:19   CT Head Wo Contrast  Result Date: 06/27/2019 CLINICAL DATA:  Patient status post fall yesterday. Initial encounter. EXAM: CT HEAD WITHOUT CONTRAST TECHNIQUE: Contiguous axial images were obtained from the base of the skull through the vertex without intravenous contrast. COMPARISON:  Head CT scan 01/22/2019. FINDINGS: Brain: No evidence of acute infarction, hemorrhage, hydrocephalus, extra-axial collection or mass lesion/mass effect. Vascular: Atherosclerosis. Skull:  Intact.  No focal lesion. Sinuses/Orbits: Minimal mucosal thickening in the maxillary sinuses is seen. Other: None. IMPRESSION: No acute abnormality. Atherosclerosis. Very mild mucosal thickening in the maxillary sinuses. Electronically Signed   By: Inge Rise M.D.   On: 06/27/2019 13:06   US RENAL  Result Date: 06/27/2019 CLINICAL DATA:  Acute renal injury with elevated BUN and creatinine EXAM: RENAL / URINARY TRACT ULTRASOUND  COMPLETE COMPARISON:  None. FINDINGS: Right Kidney: Renal measurements: 9.6 x 5.2 x 5.4 cm. = volume: 140 mL. Mild increased echogenicity is noted. No mass lesion or hydronephrosis is seen. Left Kidney: Renal measurements: 11.4 x 6.0 x 6.0 cm. = volume: 212 mL. Mild increased echogenicity is noted. No focal mass or hydronephrosis is noted. Bladder: Appears normal for degree of bladder distention. Other: None. IMPRESSION: Mild increased echogenicity consistent with medical renal disease. No acute abnormality noted. Electronically Signed   By: Inez Catalina M.D.   On: 06/27/2019 22:37    Labs: BMET Recent Labs  Lab 06/27/19 1115 06/27/19 2027 06/27/19 2357 06/28/19 1130 06/29/19 0739  NA 140 143 141  --  139  K 3.5 3.6 3.6  --  3.4*  CL 114* 118* 115*  --  112*  CO2 15* 15* 16*  --  16*  GLUCOSE 125* 199* 98  --  207*  BUN 64* 63* 64*  --  64*  CREATININE 7.02* 6.76* 6.79*  --  7.06*  CALCIUM 8.0* 7.6* 7.9*  --  7.9*  PHOS  --   --  5.0* 5.3* 5.2*   CBC Recent Labs  Lab 06/27/19 1115 06/27/19 2357 06/29/19 0739  WBC 5.7 5.2 4.8  NEUTROABS 3.5 2.4  --   HGB 6.4* 7.0* 7.3*  HCT 20.2* 21.3* 22.3*  MCV 112.2* 106.5* 107.2*  PLT 128* 110* 110*    Medications:    . amLODipine  10 mg Oral Daily  . azithromycin  250 mg Oral Daily  . cloNIDine  0.1 mg Oral BID  . folic acid  1 mg Oral Daily  . gabapentin  300 mg Oral QHS  . pantoprazole  40 mg Oral BID  . polyethylene glycol-electrolytes  4,000 mL Oral Once  . sodium bicarbonate  650 mg Oral BID  . thiamine  100 mg Oral Daily  . vitamin B-12  1,000 mcg Oral Daily      Otelia Santee, MD 06/29/2019, 9:34 AM

## 2019-06-29 NOTE — Progress Notes (Signed)
WBC nl, afebrile, no resp distress.  Hgb stable/improved 7.3.  Reviewed nature, purpose, risks of egd/colon w/ pt and he is agreeable--scheduled for tomorrow 8:30 a.m.  Discussed w/ MTS team.  Cleotis Nipper, M.D. Pager [phone] If no answer or after 5 PM call [phone]  ]

## 2019-06-29 NOTE — Progress Notes (Signed)
Assumed care from Vito Berger, RN. Patient stable and comfortable in bed.

## 2019-06-30 ENCOUNTER — Encounter (HOSPITAL_COMMUNITY): Payer: Self-pay | Admitting: Internal Medicine

## 2019-06-30 ENCOUNTER — Inpatient Hospital Stay (HOSPITAL_COMMUNITY): Payer: Self-pay | Admitting: Certified Registered"

## 2019-06-30 ENCOUNTER — Encounter (HOSPITAL_COMMUNITY)
Admission: EM | Disposition: A | Discharge status: Home / Self Care | Payer: Self-pay | Source: Home / Self Care | Attending: Internal Medicine

## 2019-06-30 HISTORY — PX: ESOPHAGOGASTRODUODENOSCOPY (EGD) WITH PROPOFOL: SHX5813

## 2019-06-30 HISTORY — PX: GIVENS CAPSULE STUDY: SHX5432

## 2019-06-30 HISTORY — PX: COLONOSCOPY WITH PROPOFOL: SHX5780

## 2019-06-30 HISTORY — PX: BIOPSY: SHX5522

## 2019-06-30 LAB — RENAL FUNCTION PANEL
Albumin: 2.1 g/dL — ABNORMAL LOW (ref 3.5–5.0)
Anion gap: 13 (ref 5–15)
BUN: 54 mg/dL — ABNORMAL HIGH (ref 6–20)
CO2: 15 mmol/L — ABNORMAL LOW (ref 22–32)
Calcium: 8.4 mg/dL — ABNORMAL LOW (ref 8.9–10.3)
Chloride: 117 mmol/L — ABNORMAL HIGH (ref 98–111)
Creatinine, Ser: 6.64 mg/dL — ABNORMAL HIGH (ref 0.61–1.24)
GFR calc Af Amer: 10 mL/min — ABNORMAL LOW (ref >60)
GFR calc non Af Amer: 8 mL/min — ABNORMAL LOW (ref >60)
Glucose, Bld: 110 mg/dL — ABNORMAL HIGH (ref 70–99)
Phosphorus: 5.7 mg/dL — ABNORMAL HIGH (ref 2.5–4.6)
Potassium: 4.2 mmol/L (ref 3.5–5.1)
Sodium: 145 mmol/L (ref 135–145)

## 2019-06-30 LAB — GLUCOSE, CAPILLARY: Glucose-Capillary: 99 mg/dL (ref 70–99)

## 2019-06-30 LAB — CBC
HCT: 23.8 % — ABNORMAL LOW (ref 39.0–52.0)
Hemoglobin: 7.6 g/dL — ABNORMAL LOW (ref 13.0–17.0)
MCH: 34.5 pg — ABNORMAL HIGH (ref 26.0–34.0)
MCHC: 31.9 g/dL (ref 30.0–36.0)
MCV: 108.2 fL — ABNORMAL HIGH (ref 80.0–100.0)
Platelets: 113 10*3/uL — ABNORMAL LOW (ref 150–400)
RBC: 2.2 MIL/uL — ABNORMAL LOW (ref 4.22–5.81)
RDW: 18.4 % — ABNORMAL HIGH (ref 11.5–15.5)
WBC: 5.5 10*3/uL (ref 4.0–10.5)
nRBC: 0.4 % — ABNORMAL HIGH (ref 0.0–0.2)

## 2019-06-30 SURGERY — ESOPHAGOGASTRODUODENOSCOPY (EGD) WITH PROPOFOL
Anesthesia: Monitor Anesthesia Care

## 2019-06-30 SURGERY — IMAGING PROCEDURE, GI TRACT, INTRALUMINAL, VIA CAPSULE
Anesthesia: LOCAL

## 2019-06-30 MED ORDER — FENTANYL CITRATE (PF) 100 MCG/2ML IJ SOLN
INTRAMUSCULAR | Status: DC | PRN
  Administered 2019-06-30 (×2): 25 ug via INTRAVENOUS

## 2019-06-30 MED ORDER — ONDANSETRON HCL 4 MG/2ML IJ SOLN
INTRAMUSCULAR | Status: DC | PRN
  Administered 2019-06-30: 4 mg via INTRAVENOUS

## 2019-06-30 MED ORDER — PHENYLEPHRINE 40 MCG/ML (10ML) SYRINGE FOR IV PUSH (FOR BLOOD PRESSURE SUPPORT)
PREFILLED_SYRINGE | INTRAVENOUS | Status: DC | PRN
  Administered 2019-06-30: 80 ug via INTRAVENOUS

## 2019-06-30 MED ORDER — PROPOFOL 500 MG/50ML IV EMUL
INTRAVENOUS | Status: DC | PRN
  Administered 2019-06-30: 75 ug/kg/min via INTRAVENOUS

## 2019-06-30 MED ORDER — DARBEPOETIN ALFA 100 MCG/0.5ML IJ SOSY
100.0000 ug | PREFILLED_SYRINGE | Freq: Once | INTRAMUSCULAR | Status: AC
  Administered 2019-06-30: 17:00:00 100 ug via SUBCUTANEOUS
  Filled 2019-06-30: qty 0.5

## 2019-06-30 MED ORDER — LIDOCAINE 2% (20 MG/ML) 5 ML SYRINGE
INTRAMUSCULAR | Status: DC | PRN
  Administered 2019-06-30: 60 mg via INTRAVENOUS

## 2019-06-30 MED ORDER — GLYCOPYRROLATE PF 0.2 MG/ML IJ SOSY
PREFILLED_SYRINGE | INTRAMUSCULAR | Status: DC | PRN
  Administered 2019-06-30: .1 mg via INTRAVENOUS

## 2019-06-30 MED ORDER — SODIUM CHLORIDE 0.9 % IV SOLN: INTRAVENOUS | Status: DC | PRN

## 2019-06-30 MED ORDER — SODIUM BICARBONATE 650 MG PO TABS
1300.0000 mg | ORAL_TABLET | Freq: Two times a day (BID) | ORAL | Status: DC
  Administered 2019-06-30 – 2019-07-01 (×3): 1300 mg via ORAL
  Filled 2019-06-30 (×3): qty 2

## 2019-06-30 MED ORDER — PROPOFOL 10 MG/ML IV BOLUS
INTRAVENOUS | Status: DC | PRN
  Administered 2019-06-30: 30 mg via INTRAVENOUS
  Administered 2019-06-30: 70 mg via INTRAVENOUS
  Administered 2019-06-30: 50 mg via INTRAVENOUS

## 2019-06-30 SURGICAL SUPPLY — 24 items

## 2019-06-30 NOTE — Anesthesia Postprocedure Evaluation (Signed)
Anesthesia Post Note  Patient: Daniel [name]  Procedure(s) Performed: ESOPHAGOGASTRODUODENOSCOPY (EGD) WITH PROPOFOL (N/A ) COLONOSCOPY WITH PROPOFOL (N/A ) BIOPSY GIVENS CAPSULE STUDY (N/A )     Patient location during evaluation: PACU Anesthesia Type: MAC Level of consciousness: awake and alert Pain management: pain level controlled Vital Signs Assessment: post-procedure vital signs reviewed and stable Respiratory status: spontaneous breathing, nonlabored ventilation, respiratory function stable and patient connected to nasal cannula oxygen Cardiovascular status: stable and blood pressure returned to baseline Postop Assessment: no apparent nausea or vomiting Anesthetic complications: no    Last Vitals:  Vitals:   06/30/19 1005 06/30/19 1123  BP: (!) 143/60 116/72  Pulse: 64 66  Resp: 13 19  Temp:  36.4 C  SpO2: 100% 100%    Last Pain:  Vitals:   06/30/19 1123  TempSrc: Oral  PainSc:                  Callyn Severtson DAVID

## 2019-06-30 NOTE — Op Note (Signed)
[facility] Patient Name: Daniel [name] Procedure Date : 06/30/2019 MRN: [id number] Attending MD: Ronald Lobo , MD Date of Birth: 01-27-1961 CSN: [id number] Age: 59 Admit Type: Inpatient Procedure:                Upper GI endoscopy Indications:              Iron deficiency anemia, Heme positive stool Providers:                Ronald Lobo, MD, Grace Isaac, RN, William Dalton, Technician Referring MD:              Medicines:                Monitored Anesthesia Care Complications:            No immediate complications. Estimated Blood Loss:     Estimated blood loss was minimal. Procedure:                Pre-Anesthesia Assessment:                           - Prior to the procedure, a History and Physical                            was performed, and patient medications and                            allergies were reviewed. The patient's tolerance of                            previous anesthesia was also reviewed. The risks                            and benefits of the procedure and the sedation                            options and risks were discussed with the patient.                            All questions were answered, and informed consent                            was obtained. Prior Anticoagulants: The patient has                            taken no previous anticoagulant or antiplatelet                            agents. ASA Grade Assessment: III - A patient with                            severe systemic disease. After reviewing the risks  and benefits, the patient was deemed in                            satisfactory condition to undergo the procedure.                           After obtaining informed consent, the endoscope was                            passed under direct vision. Throughout the                            procedure, the patient's blood pressure, pulse, and        oxygen saturations were monitored continuously. The                            GIF-H190 ([id number] Olympus gastroscope was                            introduced through the mouth, and advanced to the                            second part of duodenum. The upper GI endoscopy was                            accomplished without difficulty. The patient                            tolerated the procedure well. Scope In: Scope Out: Findings:      The larynx was normal.      The examined esophagus was normal.      The entire examined stomach was normal.      The cardia and gastric fundus were normal on retroflexion.      A single 30 mm semi-sessile polyp with no bleeding was found in the area       of the papilla; this may simply have been a very prominent papilla, but       its size would go against that. Its mucosa was normal. It was very soft.       Biopsies were taken with a cold forceps for histology. Additional random       duodenal biopsies for histology were taken with a cold forceps for       evaluation of possible celiac disease as a cause of the patient's iron       deficiency anemia. Impression:               - No source of iron deficiency anemia or heme                            positive stool endoscopically evident.                           - Normal larynx.                           - Normal esophagus.                           -  Normal stomach.                           - Duodenal polyp vs. prominent papilla,                            benign-appearing. Biopsied. Recommendation:           - Await pathology results.                           - Perform a colonoscopy today. Procedure Code(s):        --- Professional ---                           [phone], Esophagogastroduodenoscopy, flexible,                            transoral; with biopsy, single or multiple Diagnosis Code(s):        --- Professional ---                           K31.7, Polyp of stomach and duodenum                            D50.9, Iron deficiency anemia, unspecified                           R19.5, Other fecal abnormalities CPT copyright 2019 [employer]. All rights reserved. The codes documented in this report are preliminary and upon coder review may  be revised to meet current compliance requirements. Ronald Lobo, MD 06/30/2019 9:21:21 AM This report has been signed electronically. Number of Addenda: 0

## 2019-06-30 NOTE — Plan of Care (Signed)
  Problem: Clinical Measurements: Goal: Ability to maintain clinical measurements within normal limits will improve Outcome: Progressing   Problem: Coping: Goal: Level of anxiety will decrease Outcome: Progressing   Problem: Elimination: Goal: Will not experience complications related to bowel motility Outcome: Progressing Goal: Will not experience complications related to urinary retention Outcome: Progressing   Problem: Pain Managment: Goal: General experience of comfort will improve Outcome: Progressing   Problem: Safety: Goal: Ability to remain free from injury will improve Outcome: Progressing

## 2019-06-30 NOTE — Transfer of Care (Signed)
Immediate Anesthesia Transfer of Care Note  Patient: Leslye Peer Vea  Procedure(s) Performed: ESOPHAGOGASTRODUODENOSCOPY (EGD) WITH PROPOFOL (N/A ) COLONOSCOPY WITH PROPOFOL (N/A ) BIOPSY  Patient Location: Endoscopy Unit  Anesthesia Type:MAC  Level of Consciousness: awake and patient cooperative  Airway & Oxygen Therapy: Patient Spontanous Breathing and Patient connected to face mask oxygen  Post-op Assessment: Report given to RN, Post -op Vital signs reviewed and stable and Patient moving all extremities X 4  Post vital signs: Reviewed and stable  Last Vitals:  Vitals Value Taken Time  BP    Temp    Pulse    Resp    SpO2      Last Pain:  Vitals:   06/30/19 0807  TempSrc: Oral  PainSc: 0-No pain         Complications: No apparent anesthesia complications

## 2019-06-30 NOTE — Progress Notes (Addendum)
Subjective: Pt seen at the bedside this afternoon after endoscopy. States he is feeling well and tolerated the procedure. His capsule endoscopy is in process. No acute concerns at this time, asking to eat.  Objective:  Vital signs in last 24 hours: Vitals:   06/29/19 1247 06/29/19 1756 06/29/19 2350 06/30/19 0442  BP: 139/80 (!) 151/92 139/82 (!) 142/86  Pulse: 67 72 63 64  Resp: 17 17 18 18   Temp: 98.3 F (36.8 C) 97.8 F (36.6 C) 97.9 F (36.6 C) 98.6 F (37 C)  TempSrc: Oral Oral Oral Oral  SpO2: 98% 100% 98% 99%  Weight:    116 kg  Height:       Physical Exam Vitals and nursing note reviewed.  Constitutional:      General: He is not in acute distress.    Appearance: He is not ill-appearing.     Comments: Pt resting comfortably in bed. Awake and oriented.  Cardiovascular:     Rate and Rhythm: Normal rate and regular rhythm.     Heart sounds: Normal heart sounds.  Pulmonary:     Effort: Pulmonary effort is normal.  Abdominal:     General: Abdomen is flat. Bowel sounds are normal. There is distension (secondary to body habitus).     Palpations: Abdomen is soft.  Skin:    General: Skin is warm and dry.  Neurological:     Mental Status: He is alert.    Assessment/Plan:  Active Problems:   Upper GI bleed   GI bleed  [name] is a 59yo male with PMH hypertension, CKD Stage IV, mild alcohol use disorder, and B12 deficiency, who presents of a 1 day history of dizziness and generalized weakness and was found to have a Hgb of 6.4 consistent with symptomatic anemia. Also noted to have acute renal failure and a left lower lobe pneumonia on additional work-up.   Symptomatic anemia - resolved, s/p 1unit pRBCs on 3/1 Upper GI Bleed Months long history of black stools and FOBT positive in the ED. Anemia likely multifactorial given superimposed chronic renal disease and dietary deficiencies (Vitamin B12 and Fe deficiency) but acutely worsened due to suspected  superimposed GI bleeding. - Hgb 7.6 today - IV iron transfusion on 3/3, continue B12 supplementation -Nephrology adding aranesp   GI consulted, appreciate their input Endoscopy Impression: - No source of iron deficiency anemia or heme positive stool endoscopically evident. - Normal larynx, esophagus, stomach. - Duodenal polyp vs. prominent papilla, benign-appearing. Biopsied. Colonoscopy Impression: - No active bleeding or source of heme positive stool/anemia seen. - Diverticulosis in the sigmoid colon. - Probable old blood in the terminal ileum, raising the question of a small bowel source (given negative egd). - The distal rectum and anal verge are normal on retroflexion view. - To visualize the small bowel, perform video capsule endoscopy today.   AKI on CKD Cr on admission was 7.02, with prior baseline around 2.8. Labs remain stable, today K 4.2, BUN 54, Cr 6.64, bicarb 15, and GFR 10. - documented 300cc urine output yesterday - strict I/Os  Nephrology consulted, appreciate recommendations - appears to be rapidly progressive renal disease without indication for dialysis at this time - renal function stabilized - pt prefers discharge tomorrow and outpatient biopsy - will follow-up with [facility] outpatient in 2 weeks - microalbumin/creatinine ratio elevated to 1,750, HCV positive (known history), SPEP pattern of hypoalbuminemia without monoclonal protein, normal complement, HBV negative, GBM antibody negative, ANCA negative, ANA negative, and anti-DNA  antibiody negative - bicarb 650mg  1 tab PO BID - avoid nephrotoxic medications, holding home losartan    Community acquired pneumonia  Afebrile and no leukocytosis. CT on 3/3 with bilateral lower lobe pneumonia and trace bilateral effusions. - day 4 of 5 of CAP coverge with azithromycin and ceftriaxone    HTN BP labile in last 24hrs 110-170s/60-90s. - continue home clonidine 0.1mg  BID and amlodipine 10mg  - holding  losartan and HCTZ given renal function   Mild EtOH use - no withdrawal symptoms during this admission - continue home folate and thiamine    Diet: renal diet VTE: SCDs IVF: none Code: FULL   Prior to Admission Living Arrangement: home Anticipated Discharge Location: home Barriers to Discharge: completion of capsule endoscopy  Dispo: Anticipated discharge approximately tomorrow.  Ladona Horns, MD 06/30/2019, 6:46 AM Pager: [phone]

## 2019-06-30 NOTE — Progress Notes (Signed)
Patient's endoscopy and colonoscopy were well-tolerated.  They were basically unrevealing, for source of heme positive stool or iron deficiency anemia.  Please see dictated reports.  He did have a prominent polypoid lesion in the second duodenum, suggestive of a prominent papilla.  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  Biopsies are pending.  However, this lesion would not account for heme positive stool or anemia, based on its appearance.  The colonoscopy was pre of polyps, inflammation, cancer, or vascular ectasia.  I was able to enter the terminal ileum, and it did appear that there were strands of old blood present in the TI.  This would suggest a possible source of bleeding distal to the duodenum and proximal to the ileocecal valve.  Accordingly, we are arranging for the patient to have a capsule endoscopy of the small intestine today.  He could be discharged thereafter, but since it is a 12-hour study, realistically he will have to probably stay here tonight.  Cleotis Nipper, M.D. Pager [phone] If no answer or after 5 PM call [phone]

## 2019-06-30 NOTE — Anesthesia Procedure Notes (Signed)
Procedure Name: MAC Date/Time: 06/30/2019 8:44 AM Performed by: Orlie Dakin, CRNA Pre-anesthesia Checklist: Patient identified, Emergency Drugs available, Suction available and Patient being monitored Patient Re-evaluated:Patient Re-evaluated prior to induction Oxygen Delivery Method: Nasal cannula Preoxygenation: Pre-oxygenation with 100% oxygen Induction Type: IV induction Placement Confirmation: positive ETCO2

## 2019-06-30 NOTE — Interval H&P Note (Signed)
History and Physical Interval Note:  06/30/2019 8:22 AM  Daniel [name]  has presented today for surgery, with the diagnosis of heme positive stool and anemia.  The various methods of treatment have been discussed with the patient. After consideration of risks, benefits and other options for treatment, the patient has consented to  Procedure(s): ESOPHAGOGASTRODUODENOSCOPY (EGD) WITH PROPOFOL (N/A) COLONOSCOPY WITH PROPOFOL (N/A) as a surgical intervention.  The patient's history has been reviewed, patient examined, no change in status, stable for surgery.  I have reviewed the patient's chart and labs.  Questions were answered to the patient's satisfaction.     [name]

## 2019-06-30 NOTE — Progress Notes (Signed)
[facility] Progress Note   59 y.o.male40PY smoking HTN ETOH CKD3B presenting with dizziness for a day while walking up the stairs. He did fall but denied any trauma to the head of LOC. He also has a chronic cough +sputum and decreased urine output. In the ED his sats were 98% on RA with anemia (Hb 6.4) and cr of 7.02 where before the cr was 1.98 03/2018,2.8 on 12/3/20and as high as in the 4's in 2020; Cr 04/2017 was 1.4-1.6. CXR noted a LLL PNA and he was treated with azithromycin and ceftriaxone. He denies f/c/ rashes, joint pain, NSAID's, illicit drug use; there is also no h/o ESRD in the family or sudden deaths from ruptured aneurysms. He also denies obstructive symptoms, hematuria, nephrolithiasis.  Assessment/ Plan:   1. AKI on CKDIIIB - appears to be fairly rapidly progressing renal disease with proteinuria present but not an active sediment. No e/o obstruction on renal ultrasound but incr echogenicity. Preserved LV EF 60% 01/23/2019. Microalb/cr ratio is 1.75 representing 1750 mg of proteinuria extrapolated over 24 hrs. SPEP (no M-spike), ANA, DSDNA, GMB, ANCA are neg, normal C3C4 - Fortunately no acute indication for RRT and renal function is stabilizing. - Pt would like a definitive diagnosis; cr was as high as 4's in 2020 but 2.8 03/31/2019. Renal biopsy would give  a definitive diagnosis as fairly rapid progression from CKD3b to CKD5 in a span of 2 mths.  - I was planning on a  biopsy on Fri but he prefers to be d/c and then perform bx as outpt. We can make this happen from the [place] office; he will need f/u in 2 weeks at [place]. [phone].  We will call him with an appt date.  - Avoid nephrotoxins and dose meds for GFR <20 ml/min - Increase  HCO 650mg  2 tab PO BID 2. Anemia - seen by GI with h/o melena and FOBT +. Fereheme 510mg  ordered - Will dose Aranesp 100 3. CAP  4. HTN - Agree with holding the Losartan with the AKI.   Subjective:   Denies f/c/n/v/  anorexia. No events overnight. Back from scope and capsule   Objective:   BP (!) 143/60   Pulse 64   Temp 97.7 F (36.5 C) (Oral)   Resp 13   Ht 5\' 9"  (1.753 m)   Wt 121.1 kg   SpO2 100%   BMI 39.43 kg/m   Intake/Output Summary (Last 24 hours) at 06/30/2019 1201 Last data filed at 06/30/2019 0913 Gross per 24 hour  Intake 450 ml  Output 300 ml  Net 150 ml   Weight change: -1.452 kg  Physical Exam: General: NCAT, NAD Resp:clear to auscultation bilaterally Cardio:S1, S2 normal EX:BMWU, non-tender; bowel sounds normal; no masses, no organomegaly Extremities:edematr Pulses:2+ and symmetric Neurologic:Grossly normal  Imaging: CT CHEST WO CONTRAST  Result Date: 06/29/2019 CLINICAL DATA:  Pleural effusion EXAM: CT CHEST WITHOUT CONTRAST TECHNIQUE: Multidetector CT imaging of the chest was performed following the standard protocol without IV contrast. COMPARISON:  None. FINDINGS: Cardiovascular: No significant vascular findings. Normal heart size. No pericardial effusion. Thoracic aortic atherosclerosis. Mediastinum/Nodes: No enlarged mediastinal or axillary lymph nodes. Thyroid gland, trachea, and esophagus demonstrate no significant findings. Lungs/Pleura: Small bilateral pleural effusions. Bilateral lower lobe airspace disease, left greater than right. Small area of airspace disease in the superior segment of the right lower lobe. No pneumothorax. Upper Abdomen: Cholelithiasis. Musculoskeletal: No acute osseous abnormality. No aggressive osseous lesion. Calcified central disc protrusion at T6-7. IMPRESSION: 1.  Bilateral lower lobe pneumonia. Trace bilateral pleural effusions. 2.  Aortic Atherosclerosis (ICD10-I70.0). 3. Cholelithiasis. Electronically Signed   By: Kathreen Devoid   On: 06/29/2019 11:03    Labs: BMET Recent Labs  Lab 06/27/19 1115 06/27/19 2027 06/27/19 2357 06/28/19 1130 06/29/19 0739 06/30/19 0348  NA 140 143 141  --  139 145  K 3.5 3.6 3.6  --  3.4* 4.2   CL 114* 118* 115*  --  112* 117*  CO2 15* 15* 16*  --  16* 15*  GLUCOSE 125* 199* 98  --  207* 110*  BUN 64* 63* 64*  --  64* 54*  CREATININE 7.02* 6.76* 6.79*  --  7.06* 6.64*  CALCIUM 8.0* 7.6* 7.9*  --  7.9* 8.4*  PHOS  --   --  5.0* 5.3* 5.2* 5.7*   CBC Recent Labs  Lab 06/27/19 1115 06/27/19 2357 06/29/19 0739 06/30/19 0348  WBC 5.7 5.2 4.8 5.5  NEUTROABS 3.5 2.4  --   --   HGB 6.4* 7.0* 7.3* 7.6*  HCT 20.2* 21.3* 22.3* 23.8*  MCV 112.2* 106.5* 107.2* 108.2*  PLT 128* 110* 110* 113*    Medications:    . amLODipine  10 mg Oral Daily  . azithromycin  250 mg Oral Daily  . cloNIDine  0.2 mg Oral BID  . folic acid  1 mg Oral Daily  . gabapentin  300 mg Oral QHS  . pantoprazole  40 mg Oral BID  . sodium bicarbonate  650 mg Oral BID  . thiamine  100 mg Oral Daily  . vitamin B-12  1,000 mcg Oral Daily      Otelia Santee, MD 06/30/2019, 12:01 PM

## 2019-06-30 NOTE — Anesthesia Preprocedure Evaluation (Signed)
Anesthesia Evaluation  Patient identified by MRN, date of birth, ID band Patient awake    Reviewed: Allergy & Precautions, NPO status , Patient's Chart, lab work & pertinent test results  Airway Mallampati: I  TM Distance: >3 FB Neck ROM: Full    Dental   Pulmonary Current Smoker and Patient abstained from smoking.,    Pulmonary exam normal        Cardiovascular hypertension, Pt. on medications Normal cardiovascular exam     Neuro/Psych H/O IC Hemorhagge    GI/Hepatic   Endo/Other  diabetes, Type 2, Oral Hypoglycemic Agents  Renal/GU Renal InsufficiencyRenal disease     Musculoskeletal   Abdominal   Peds  Hematology   Anesthesia Other Findings   Reproductive/Obstetrics                             Anesthesia Physical Anesthesia Plan  ASA: III  Anesthesia Plan: MAC   Post-op Pain Management:    Induction: Intravenous  PONV Risk Score and Plan: Treatment may vary due to age or medical condition  Airway Management Planned: Nasal Cannula  Additional Equipment:   Intra-op Plan:   Post-operative Plan:   Informed Consent: I have reviewed the patients History and Physical, chart, labs and discussed the procedure including the risks, benefits and alternatives for the proposed anesthesia with the patient or authorized representative who has indicated his/her understanding and acceptance.       Plan Discussed with: CRNA and Surgeon  Anesthesia Plan Comments:         Anesthesia Quick Evaluation

## 2019-06-30 NOTE — Op Note (Signed)
[facility] Patient Name: Daniel [name] Procedure Date : 06/30/2019 MRN: [id number] Attending MD: Ronald Lobo , MD Date of Birth: [birth year] CSN: [id number] Age: 59 Admit Type: Inpatient Procedure:                Colonoscopy Indications:              Heme positive stool, Iron deficiency anemia Providers:                Ronald Lobo, MD, Grace Isaac, RN, William Dalton, Technician Referring MD:              Medicines:                Monitored Anesthesia Care Complications:            No immediate complications. Estimated Blood Loss:     Estimated blood loss: none. Procedure:                Pre-Anesthesia Assessment:                           - Prior to the procedure, a History and Physical                            was performed, and patient medications and                            allergies were reviewed. The patient's tolerance of                            previous anesthesia was also reviewed. The risks                            and benefits of the procedure and the sedation                            options and risks were discussed with the patient.                            All questions were answered, and informed consent                            was obtained. Prior Anticoagulants: The patient has                            taken no previous anticoagulant or antiplatelet                            agents. ASA Grade Assessment: III - A patient with                            severe systemic disease. After reviewing the risks  and benefits, the patient was deemed in                            satisfactory condition to undergo the procedure.                           After obtaining informed consent, the colonoscope                            was passed under direct vision. Throughout the                            procedure, the patient's blood pressure, pulse, and   oxygen saturations were monitored continuously. The                            CF-HQ190L ([id number]) Olympus colonoscope was                            introduced through the anus and advanced to the the                            terminal ileum. The colonoscopy was performed                            without difficulty. The patient tolerated the                            procedure well. The quality of the bowel                            preparation was good. Scope In: 8:54:17 AM Scope Out: 9:06:46 AM Total Procedure Duration: 0 hours 12 minutes 29 seconds  Findings:      The perianal and digital rectal examinations were normal. Pertinent       negatives include normal prostate (size, shape, and consistency).      A few medium-mouthed diverticula were found in the sigmoid colon.      No other significant abnormalities were identified in a careful       examination of the remainder of the colon.      There is no endoscopic evidence of inflammation, mass, polyps or       angiodysplasia in the entire colon.      The terminal ileum contained hematin (altered blood/coffee-ground-like       material). The mucosa of the TI was normal.      The retroflexed view of the distal rectum and anal verge was normal and       showed no anal or rectal abnormalities. Impression:               - No active bleeding or source of heme positive                            stool/anemia seen.                           - Diverticulosis in the  sigmoid colon.                           - Probable old blood in the terminal ileum, raising                            the question of a small bowel source (given                            negative egd).                           - The distal rectum and anal verge are normal on                            retroflexion view.                           - No specimens collected. Recommendation:           - To visualize the small bowel, perform video                             capsule endoscopy today. Procedure Code(s):        --- Professional ---                           [phone], Colonoscopy, flexible; diagnostic, including                            collection of specimen(s) by brushing or washing,                            when performed (separate procedure) Diagnosis Code(s):        --- Professional ---                           K92.2, Gastrointestinal hemorrhage, unspecified                           R19.5, Other fecal abnormalities                           D50.9, Iron deficiency anemia, unspecified CPT copyright 2019 [employer]. All rights reserved. The codes documented in this report are preliminary and upon coder review may  be revised to meet current compliance requirements. Ronald Lobo, MD 06/30/2019 9:29:35 AM This report has been signed electronically. Number of Addenda: 0

## 2019-07-01 ENCOUNTER — Encounter (HOSPITAL_COMMUNITY): Payer: Self-pay | Admitting: Internal Medicine

## 2019-07-01 DIAGNOSIS — R768 Other specified abnormal immunological findings in serum: Secondary | ICD-10-CM

## 2019-07-01 LAB — CBC
HCT: 23.8 % — ABNORMAL LOW (ref 39.0–52.0)
Hemoglobin: 7.7 g/dL — ABNORMAL LOW (ref 13.0–17.0)
MCH: 35.2 pg — ABNORMAL HIGH (ref 26.0–34.0)
MCHC: 32.4 g/dL (ref 30.0–36.0)
MCV: 108.7 fL — ABNORMAL HIGH (ref 80.0–100.0)
Platelets: 107 10*3/uL — ABNORMAL LOW (ref 150–400)
RBC: 2.19 MIL/uL — ABNORMAL LOW (ref 4.22–5.81)
RDW: 18.2 % — ABNORMAL HIGH (ref 11.5–15.5)
WBC: 4.4 10*3/uL (ref 4.0–10.5)
nRBC: 0.5 % — ABNORMAL HIGH (ref 0.0–0.2)

## 2019-07-01 LAB — RENAL FUNCTION PANEL
Albumin: 2.2 g/dL — ABNORMAL LOW (ref 3.5–5.0)
Anion gap: 11 (ref 5–15)
BUN: 51 mg/dL — ABNORMAL HIGH (ref 6–20)
CO2: 17 mmol/L — ABNORMAL LOW (ref 22–32)
Calcium: 8.1 mg/dL — ABNORMAL LOW (ref 8.9–10.3)
Chloride: 113 mmol/L — ABNORMAL HIGH (ref 98–111)
Creatinine, Ser: 6.41 mg/dL — ABNORMAL HIGH (ref 0.61–1.24)
GFR calc Af Amer: 10 mL/min — ABNORMAL LOW (ref >60)
GFR calc non Af Amer: 9 mL/min — ABNORMAL LOW (ref >60)
Glucose, Bld: 153 mg/dL — ABNORMAL HIGH (ref 70–99)
Phosphorus: 5.4 mg/dL — ABNORMAL HIGH (ref 2.5–4.6)
Potassium: 3.8 mmol/L (ref 3.5–5.1)
Sodium: 141 mmol/L (ref 135–145)

## 2019-07-01 LAB — SURGICAL PATHOLOGY

## 2019-07-01 MED ORDER — FERROUS SULFATE 325 (65 FE) MG PO TBEC: 325.0000 mg | DELAYED_RELEASE_TABLET | Freq: Two times a day (BID) | ORAL | 0 refills | Status: DC

## 2019-07-01 MED ORDER — VITAMIN B-12 1000 MCG PO TABS: 1000.0000 ug | ORAL_TABLET | Freq: Every day | ORAL | 0 refills | Status: DC

## 2019-07-01 MED ORDER — FLUTICASONE PROPIONATE 50 MCG/ACT NA SUSP
1.0000 | Freq: Two times a day (BID) | NASAL | Status: DC | PRN
  Filled 2019-07-01: qty 16

## 2019-07-01 MED ORDER — SODIUM BICARBONATE 650 MG PO TABS: 1300.0000 mg | ORAL_TABLET | Freq: Two times a day (BID) | ORAL | 0 refills | Status: DC

## 2019-07-01 MED FILL — SODIUM BICARBONATE 650 MG T: 650 | 30 days supply | Qty: 120 | Fill #0

## 2019-07-01 MED FILL — FERROUS SULFATE 325 MG TAB: 325 (65 FE) | 30 days supply | Qty: 60 | Fill #0

## 2019-07-01 MED FILL — VITAMIN B-12 1000 MCG TABS: 1000 | 30 days supply | Qty: 30 | Fill #0

## 2019-07-01 NOTE — Discharge Summary (Signed)
Name: Daniel [name] MRN: [id number] DOB: [birth year] 59 y.o. PCP: [name], NP  Date of Admission: 06/27/2019 10:59 AM Date of Discharge: 07/01/2019 Attending Physician: Lucious Groves, DO  Discharge Diagnosis: Active Problems:   Upper GI bleed   GI bleed  Discharge Medications: Allergies as of 07/01/2019   No Known Allergies     Medication List    STOP taking these medications   losartan 25 MG tablet Commonly known as: COZAAR     TAKE these medications   amLODipine 10 MG tablet Commonly known as: NORVASC Take 1 tablet (10 mg total) by mouth daily.   blood glucose meter kit and supplies Kit Dispense based on patient and insurance preference. Use up to four times daily as directed. (FOR ICD-9 250.00, 250.01).   cloNIDine 0.2 MG tablet Commonly known as: CATAPRES Take  1 tablet twice daily . If makes drowsy take 2 tablets - after he is off of work   ferrous sulfate 325 (65 FE) MG EC tablet Take 1 tablet (325 mg total) by mouth 2 (two) times daily.   folic acid 1 MG tablet Commonly known as: FOLVITE Take 1 tablet (1 mg total) by mouth daily.   gabapentin 300 MG capsule Commonly known as: NEURONTIN Patient takes 1 tablet twice a day and 2 tablets at night. What changed:   how much to take  how to take this  when to take this  additional instructions   sodium bicarbonate 650 MG tablet Take 2 tablets (1,300 mg total) by mouth 2 (two) times daily.   vitamin B-12 1000 MCG tablet Commonly known as: CYANOCOBALAMIN Take 1 tablet (1,000 mcg total) by mouth daily.       Disposition and follow-up:   DanielDaniel [name] was discharged from [facility] in Stable condition.  At the hospital follow up visit please address:  1.  Symptomatic anemia GI bleed Fe deficiency anemia - follow-up capsule endoscopy results - monitor Hgb and hemoccult outpatient via PCP - continue Fe, B12, and folate supplementation  AKI on CKD - pt  with rapidly progressive renal failure - outpatient follow-up with [facility] for renal biopsy - avoid nephrotoxic medications, pt instructed not to take NSAIDs, losartan, or HCTZ  Untreated Hep C - pt with +Hep C antibody from 2 years ago - outpatient referral to infectious disease for treatment   Bilateral lower lobe pneumonia (L>R) - CXR with evidence of LLL pneumonia and CT showing bilateral LL pneumonia - pt virtually asymptomatic, endorsed some cough - completed 5 day course of ceftriaxone/azithromycin   2.  Labs / imaging needed at time of follow-up: monitor renal function, Hgb, and hemoccult   3.  Pending labs/ test needing follow-up: pathology from duodenal endoscopy biopsy  Follow-up Appointments: Follow-up Information    [name], NP Follow up.   Specialty: Internal Medicine Why: July 21, 2019 at 0910 am  Contact information: [address] [place] [address] [phone]        [facility] [place]. Call in 1 week(s).   Why: Schedule an appointment with a kidney doctor for follow-up and a kidney biopsy.  Contact information: [place] [id number] [place] [facility] Follow up on 07/11/2019.   Specialty: Infectious Diseases Why: Please go to a follow-up appointment on 10:30AM on Monday March 15th with the infectious disease clinic for management of your Hepatitis C.  Contact information:  [place], Tina [id number] [address] [facility] Hospital Course by problem list: 1. Daniel [name] is a 59yo male with PMH hypertension, CKD Stage IV, mild alcohol use disorder, and B12 deficiency, who presents of a 1 day history of dizziness and generalized weakness. Of note, pt had a recent hospitalization in 03/2019, where he was found to have CKD and Fe deficiency anemia. He did not have any outpatient follow-up of these  findings, and presented on 06/27/2019 with chronic renal failure and a progressive symptomatic anemia.  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 SPEP (no M-spike), ANA, DSDNA, GMB, ANCA are neg, normal C3C4. [place] without obstruction and with increase echogenicity. - started on HCO 1337m PO BID - avoid nephrotoxic medications, pt instructed not to take NSAIDs, losartan, or HCTZ  Bilateral lower lobe pneumonia (L>R) - CXR on admisison with evidence of LLL pneumonia  - pt asymptomatic at this time, aside from mild worsening of chronic cough - treated with ceftriaxone/azithromycin for CAP - chest CT completed on 3/3 due to pt's lack of symptoms, results showed bilateral lower lobe pneumonias with trace bilateral pleural effusions - completed 5 day course of CAP coverage on 07/01/2019 before discharge  Discharge Vitals:   BP (!) 154/87 (BP Location: Left Arm)   Pulse 62   Temp (!) 97.4 F (36.3 C) (Oral)   Resp 18   Ht _0  (1.753 m)   Wt 116.4 kg   SpO2 100%   BMI 37.89 kg/m   Pertinent Labs, Studies, and Procedures:  Endoscopy 06/30/2019 Impression: - No source of iron  deficiency anemia or heme positive stool endoscopically evident. - Normal larynx, esophagus, stomach. - Duodenal polyp vs. prominent papilla, benign-appearing. Biopsied.  Colonoscopy 06/30/2019 Impression: - No active bleeding or source of heme positive stool/anemia seen. - Diverticulosis in the sigmoid colon. - Probable old blood in the terminal ileum, raising the question of a small bowel source (given negative egd). - The distal rectum and anal verge are normal on retroflexion view. - To visualize the small bowel, perform video capsule endoscopy today.  Capsule endoscopy results pending  CBC Latest Ref Rng & Units 07/01/2019 06/30/2019 06/29/2019  WBC 4.0 - 10.5 K/uL 4.4 5.5 4.8  Hemoglobin 13.0 - 17.0 g/dL 7.7(L) 7.6(L) 7.3(L)  Hematocrit 39.0 - 52.0 % 23.8(L) 23.8(L) 22.3(L)  Platelets 150 - 400 K/uL 107(L) 113(L) 110(L)   BMP Latest Ref Rng & Units 07/01/2019 06/30/2019 06/29/2019  Glucose 70 - 99 mg/dL 153(H) 110(H) 207(H)  BUN 6 - 20 mg/dL 51(H) 54(H) 64(H)  Creatinine 0.61 - 1.24 mg/dL 6.41(H) 6.64(H) 7.06(H)  Sodium  135 - 145 mmol/L 141 145 139  Potassium 3.5 - 5.1 mmol/L 3.8 4.2 3.4(L)  Chloride 98 - 111 mmol/L 113(H) 117(H) 112(H)  CO2 22 - 32 mmol/L 17(L) 15(L) 16(L)  Calcium 8.9 - 10.3 mg/dL 8.1(L) 8.4(L) 7.9(L)   Lab Results  Component Value Date   LABMICR 946.0 06/04/2017   MICROALBUR 957.5 (H) 06/27/2019   Recent Results (from the past 240 hour(s))  Respiratory Panel by RT PCR (Flu A&B, Covid) - Nasopharyngeal Swab     Status: None   Collection Time: 06/27/19  1:47 PM   Specimen: Nasopharyngeal Swab  Result Value Ref Range Status   SARS Coronavirus 2 by RT PCR NEGATIVE NEGATIVE Final    Comment: (NOTE) SARS-CoV-2 target nucleic acids are NOT DETECTED. The SARS-CoV-2 RNA is generally detectable in upper respiratoy specimens during the acute phase of infection. The lowest concentration of SARS-CoV-2 viral copies this assay can detect is 131 copies/mL. A negative result  does not preclude SARS-Cov-2 infection and should not be used as the sole basis for treatment or other patient management decisions. A negative result may occur with  improper specimen collection/handling, submission of specimen other than nasopharyngeal swab, presence of viral mutation(s) within the areas targeted by this assay, and inadequate number of viral copies (<131 copies/mL). A negative result must be combined with clinical observations, patient history, and epidemiological information. The expected result is Negative. Fact Sheet for Patients:  PinkCheek.be Fact Sheet for Healthcare Providers:  GravelBags.it This test is not yet ap proved or cleared by the [place] FDA and  has been authorized for detection and/or diagnosis of SARS-CoV-2 by FDA under an Emergency Use Authorization (EUA). This EUA Daniel remain  in effect (meaning this test can be used) for the duration of the COVID-19 declaration under Section 564(b)(1) of the Act, 21 U.S.C. section 360bbb-3(b)(1), unless the authorization is terminated or revoked sooner.    Influenza A by PCR NEGATIVE NEGATIVE Final   Influenza B by PCR NEGATIVE NEGATIVE Final    Comment: (NOTE) The Xpert Xpress SARS-CoV-2/FLU/RSV assay is intended as an aid in  the diagnosis of influenza from Nasopharyngeal swab specimens and  should not be used as a sole basis for treatment. Nasal washings and  aspirates are unacceptable for Xpert Xpress SARS-CoV-2/FLU/RSV  testing. Fact Sheet for Patients: PinkCheek.be Fact Sheet for Healthcare Providers: GravelBags.it This test is not yet approved or cleared by the [place] FDA and  has been authorized for detection and/or diagnosis of SARS-CoV-2 by  FDA under an Emergency Use Authorization (EUA). This EUA Daniel remain  in effect (meaning this test can be used) for the duration of  the  Covid-19 declaration under Section 564(b)(1) of the Act, 21  U.S.C. section 360bbb-3(b)(1), unless the authorization is  terminated or revoked. Performed at [facility], [place] [address] [place], [place] [address]    CT CHEST WO CONTRAST CLINICAL DATA:  Pleural effusion  EXAM: CT CHEST WITHOUT CONTRAST  TECHNIQUE: Multidetector CT imaging of the chest was performed following the standard protocol without IV contrast.  COMPARISON:  None.  FINDINGS: Cardiovascular: No significant vascular findings. Normal heart size. No pericardial effusion. Thoracic aortic atherosclerosis.  Mediastinum/Nodes: No enlarged mediastinal or axillary lymph nodes. Thyroid gland, trachea, and esophagus demonstrate no significant findings.  Lungs/Pleura: Small bilateral pleural effusions. Bilateral lower lobe airspace disease, left greater than right. Small area of airspace disease in the superior segment of the right lower lobe. No pneumothorax.  Upper Abdomen: Cholelithiasis.  Musculoskeletal: No acute osseous abnormality. No aggressive osseous lesion. Calcified central disc protrusion at T6-7.  IMPRESSION: 1. Bilateral lower lobe pneumonia. Trace bilateral pleural effusions. 2.  Aortic Atherosclerosis (ICD10-I70.0). 3. Cholelithiasis.  Electronically Signed   By: Kathreen Devoid   On: 06/29/2019 11:03   US RENAL 06/27/2019 CLINICAL DATA:  Acute renal injury with elevated BUN and creatinine  EXAM: RENAL / URINARY TRACT ULTRASOUND COMPLETE  COMPARISON:  None.  FINDINGS: Right Kidney:  Renal measurements: 9.6 x 5.2 x 5.4 cm. = volume: 140 mL. Mild increased echogenicity is noted. No mass lesion or hydronephrosis is seen.  Left Kidney:  Renal measurements: 11.4 x 6.0 x 6.0 cm. = volume: 212 mL. Mild increased echogenicity is noted. No focal mass or hydronephrosis is noted.  Bladder:  Appears normal for degree of bladder  distention.  Other:  None.  IMPRESSION: Mild increased echogenicity consistent with medical renal disease. No acute abnormality noted.   Electronically Signed   By: Inez Catalina M.D.   On: 06/27/2019 22:37  CT HEAD 06/27/2019 CLINICAL DATA:  Patient status post fall yesterday. Initial encounter.  EXAM: CT HEAD WITHOUT CONTRAST  TECHNIQUE: Contiguous axial images were obtained from the base of the skull through the vertex without intravenous contrast.  COMPARISON:  Head CT scan 01/22/2019.  FINDINGS: Brain: No evidence of acute infarction, hemorrhage, hydrocephalus, extra-axial collection or mass lesion/mass effect.  Vascular: Atherosclerosis.  Skull: Intact.  No focal lesion.  Sinuses/Orbits: Minimal mucosal thickening in the maxillary sinuses is seen.  Other: None.  IMPRESSION: No acute abnormality.  Atherosclerosis.  Very mild mucosal thickening in the maxillary sinuses.   Electronically Signed   By: Inge Rise M.D.   On: 06/27/2019 13:06   CXR 06/27/2019 CLINICAL DATA:  Short of breath.  EXAM: CHEST - 2 VIEW  COMPARISON:  03/29/2019  FINDINGS: Mild cardiac enlargement. No pleural effusion identified. Left lower lobe airspace consolidation is identified. Right lung clear. Visualized osseous structures are notable for multilevel degenerative disc disease in the thoracic spine  IMPRESSION: Left lower lobe pneumonia. Followup PA and lateral chest X-ray is recommended in 3-4 weeks following trial of antibiotic therapy to ensure resolution and exclude underlying malignancy.   Electronically Signed   By: Kerby Moors M.D.   On: 06/27/2019 12:19   Discharge Instructions: Discharge Instructions    Call MD for:   Complete by: As directed    Any bright red blood in your stools, dizziness/lightheadedness, or if you begin to urinate less.   Call MD for:  extreme fatigue   Complete by: As directed    Call MD for:   persistant dizziness or light-headedness   Complete by: As directed    Call MD for:  persistant nausea and vomiting   Complete by: As directed    Call MD for:  temperature >100.4   Complete by: As directed    Diet - low sodium heart healthy   Complete by: As directed    Discharge instructions   Complete by: As directed    Mr. Meisenheimer,  You were seen in the hospital for low blood counts and worsening renal function. A scope to look into the GI tract did not show any evidence of continued bleeding initially, but a GI doctor Daniel call you with the final results. Please follow-up with your primary provider to monitor your blood counts.   Lab work indicates that your kidney function has gotten much worse since your last time in the hospital. Please follow-up  with the [facility] doctors for kidney biopsy and continued management. Your prescription for losartan and hydrochlorothiazide was discontinued because they can be harmful to the kidneys.   Also you tested positive for hepatitis C here in the hospital. Please follow-up with the infectious disease clinic on March 15th at 10:30AM for treatment of this.  Thank you for letting us be a part of your care!   Increase activity slowly   Complete by: As directed       Signed: Ladona Horns, MD 07/01/2019, 12:03 PM   Pager: [phone]

## 2019-07-01 NOTE — Progress Notes (Signed)
Pt discharge education and instructions completed with pt at bedside; voices understanding and denies any questions. Pt IV and telemetry removed; pt prescribed medications delivered to pt at bedside by pharmacy. Pt discharged home and pt to self transport home. Pt upcoming appoints with location and time highlighted and reinforced to pt on the importance of keeping those appointments. Pt voices understanding. Pt offered wheelchair but he declined and ambulated off unit independently with belongings to the side. Delia Heady RN

## 2019-07-01 NOTE — Progress Notes (Signed)
Paged for patient have a 10 run of vtach on telemetry. Telemetry reviewed with run of vtach vs. SVT without other abnormal rhythms. At bedside patient is asymptomatic. He denies recent chest pain or shortness of breath. Most recent echo 12/2018 without signs of structural heart disease except mild LVH. Vitals WNL and physical exam benign. At this time he is stable and will need close follow-up with his PCP and multiple specialists and to have his multiple comorbidities addressed and optimized.  Molli Hazard A, DO 07/01/2019, 11:27 AM Pager: [phone]

## 2019-07-01 NOTE — Progress Notes (Addendum)
   Subjective: Pt seen at the bedside this morning. States he is feeling well and ready to go home today. Denies abdominal pain, nausea/vomiting, or shortness of breath. Endorsing some sinus congestion.  Discussed with pt the importance of outpatient follow-up with nephrology, GI, infectious disease, and his primary care doctor.   Objective:  Vital signs in last 24 hours: Vitals:   06/30/19 1123 06/30/19 1802 07/01/19 0029 07/01/19 0559  BP: 116/72 (!) 142/85 125/71 (!) 154/87  Pulse: 66 64 60 62  Resp: 19 19 16 18   Temp: 97.6 F (36.4 C) 97.8 F (36.6 C) 98 F (36.7 C) (!) 97.4 F (36.3 C)  TempSrc: Oral Oral Oral Oral  SpO2: 100% 100% 98% 100%  Weight:    116.4 kg  Height:       Physical Exam Vitals and nursing note reviewed.  Constitutional:      General: He is not in acute distress.    Appearance: He is not ill-appearing.     Comments: Pt resting in bed comfortably.  Cardiovascular:     Rate and Rhythm: Normal rate and regular rhythm.  Pulmonary:     Effort: Pulmonary effort is normal.  Abdominal:     General: Abdomen is flat. Bowel sounds are normal.     Palpations: Abdomen is soft.  Neurological:     Mental Status: He is alert.    Assessment/Plan:  Active Problems:   Upper GI bleed   GI bleed  Mr. Mika Anastasi is a 59yo male with PMH hypertension, CKD Stage IV, mildalcohol usedisorder, and B12 deficiency, who presents of a 1 day history of dizziness and generalized weakness and was found to have a Hgb of 6.4 consistent with symptomatic anemia. Also noted to have acute renal failure and a left lower lobe pneumonia on additional work-up.  Symptomatic anemia- resolved, s/p 1unit pRBCs on 3/1 UpperGI Bleed Months long history of black stools and FOBT positive in the ED.Anemia likely multifactorial given superimposed chronic renal disease and dietary deficiencies (Vitamin B12 and Fe deficiency)but acutely worsened due to suspected superimposed GI  bleeding. - Hgb stable - IV iron transfusion on 3/3, continue B12 supplementation - nephrology gave 15mcg Aranesp on 3/4  GI consulted,appreciate their input - endoscopy and colonoscopy without evidence for bleeding source - biopsy of duodenal polypoid lesion are pending - will notify pt of capsule endoscopy and pathology results after discharge - continue iron supplementation until he is confirmed to be no longer iron deficient  - outpatient PCP vs GI follow-up to monitor Hgb and hemoccult status - hep C antibody reactive, will refer to ID clinic  AKIon CKD Cr on admission was 7.02, with prior baseline around 2.8.Labs remain stable. - no urine output documented - strict I/Os  Nephrology consulted, appreciate recommendations - appears to be rapidly progressive renal diseasewithoutindication for dialysis - follow-up with [facility] in 2 weeks and get outpatient biopsy - continue bicarb 1300mg  PO BID outpatient - hold nephrotoxic medications  Community acquired pneumonia CT on 3/3 with bilateral lower lobe pneumonia and trace bilateral effusions. Pt on room air and has remains virtually asymptomatic aside from mild cough and congestion. - day5of 5 of CAP coverge with azithromycin and ceftriaxone  HTN - continuehomeclonidine 0.1mg  BID and amlodipine 10mg  - hold losartan and HCTZ given renal function - outpatient pharmacy called to cancel those prescriptions and refills  Diet:renal diet ELF:YBOF BPZ:WCHE Code:FULL  Dispo: Anticipated discharge today.  Ladona Horns, MD 07/01/2019, 10:56 AM Pager: [phone]

## 2019-07-01 NOTE — Progress Notes (Signed)
[facility] Progress Note   59 y.o.male40PY smoking HTN ETOH CKD3B presenting with dizziness for a day while walking up the stairs. He did fall but denied any trauma to the head of LOC. He also has a chronic cough +sputum and decreased urine output. In the ED his sats were 98% on RA with anemia (Hb 6.4) and cr of 7.02 where before the cr was 1.98 03/2018,2.8 on 12/3/20and as high as in the 4's in 2020; Cr 04/2017 was 1.4-1.6. CXR noted a LLL PNA and he was treated with azithromycin and ceftriaxone. He denies f/c/ rashes, joint pain, NSAID's, illicit drug use; there is also no h/o ESRD in the family or sudden deaths from ruptured aneurysms. He also denies obstructive symptoms, hematuria, nephrolithiasis.  Assessment/ Plan:   1. AKI on CKDIIIB - appears to be fairly rapidly progressing renal disease with proteinuria present but not an active sediment. No e/o obstruction on renal ultrasound but incr echogenicity. Preserved LV EF 60% 01/23/2019. Microalb/cr ratio is 1.75 representing 1750 mg of proteinuria extrapolated over 24 hrs. SPEP (no M-spike), ANA, DSDNA, GMB, ANCA are neg, normal C3C4 - Fortunately no acute indication for RRT and renal function is fairly stable but certainly w/ significantly compromised renal function. - Pt would like a definitive diagnosis; cr was as high as 4's in 2020 but 2.8 03/31/2019. Renal biopsy would give  a definitive diagnosisas fairly rapid progression from CKD3b to CKD5 in a span of 2 mths.  - I was planning on a  biopsy on Fri but he prefers to be d/c and then perform bx as outpt.  He will need f/u in 2 weeks at [place]; I've already contacted the office and they will call him with an appt.  I encouraged him to make sure he goes to the appt as he is heading towards RRT.  Continue  650mg  2 tab PO BID 2. Anemia - seen by GI with h/o melena and FOBT +.Fereheme 510mg  ordered - Gave Aranesp 100 sq on 3/4  3. CAP  4. HTN - Agree with holding the  Losartan with the AKI.  Subjective:   Denies f/c/n/v/ anorexia. No events overnight.   Objective:   BP (!) 163/88 (BP Location: Left Arm)   Pulse 64   Temp 97.9 F (36.6 C) (Oral)   Resp 18   Ht 5\' 9"  (1.753 m)   Wt 116.4 kg   SpO2 99%   BMI 37.89 kg/m   Intake/Output Summary (Last 24 hours) at 07/01/2019 1220 Last data filed at 07/01/2019 1212 Gross per 24 hour  Intake 1642 ml  Output 200 ml  Net 1442 ml   Weight change: 5.126 kg  Physical Exam: General: NCAT, NAD Resp:clear to auscultation bilaterally Cardio:S1, S2 normal KK:XFGH, non-tender; bowel sounds normal; no masses, no organomegaly Extremities:edematr Pulses:2+ and symmetric Neurologic:Grossly normal  Imaging: No results found.  Labs: BMET Recent Labs  Lab 06/27/19 1115 06/27/19 2027 06/27/19 2357 06/28/19 1130 06/29/19 0739 06/30/19 0348 07/01/19 0808  NA 140 143 141  --  139 145 141  K 3.5 3.6 3.6  --  3.4* 4.2 3.8  CL 114* 118* 115*  --  112* 117* 113*  CO2 15* 15* 16*  --  16* 15* 17*  GLUCOSE 125* 199* 98  --  207* 110* 153*  BUN 64* 63* 64*  --  64* 54* 51*  CREATININE 7.02* 6.76* 6.79*  --  7.06* 6.64* 6.41*  CALCIUM 8.0* 7.6* 7.9*  --  7.9* 8.4*  8.1*  PHOS  --   --  5.0* 5.3* 5.2* 5.7* 5.4*   CBC Recent Labs  Lab 06/27/19 1115 06/27/19 1115 06/27/19 2357 06/29/19 0739 06/30/19 0348 07/01/19 0808  WBC 5.7   < > 5.2 4.8 5.5 4.4  NEUTROABS 3.5  --  2.4  --   --   --   HGB 6.4*   < > 7.0* 7.3* 7.6* 7.7*  HCT 20.2*   < > 21.3* 22.3* 23.8* 23.8*  MCV 112.2*   < > 106.5* 107.2* 108.2* 108.7*  PLT 128*   < > 110* 110* 113* 107*   < > = values in this interval not displayed.    Medications:    . amLODipine  10 mg Oral Daily  . cloNIDine  0.2 mg Oral BID  . folic acid  1 mg Oral Daily  . gabapentin  300 mg Oral QHS  . pantoprazole  40 mg Oral BID  . sodium bicarbonate  1,300 mg Oral BID  . thiamine  100 mg Oral Daily  . vitamin B-12  1,000 mcg Oral Daily       Otelia Santee, MD 07/01/2019, 12:20 PM

## 2019-07-01 NOTE — Progress Notes (Signed)
Pharmacy student rounding with IMTS/B1 service. Asked by Dr. Heber [place] to contact patients pharmacy to discontinue his home medications, all HCTZ and losartan prescriptions plus any refills due to worsening renal function.   Spoke with pharmacy technician who d/c'd these prescriptions while I was on the phone with her.   Patients outpatient pharmacy is [facility]; phone number [phone].

## 2019-07-01 NOTE — Progress Notes (Signed)
Capsule endoscopy complete.  Hope to have it read by later today, or tomorrow.    Patient's hemoglobin is stable at 7.6, so I do not feel it is essential to keep the patient in the hospital until the capsule study is read.    No BM's since colonoscopy prep.  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  Recommendations:  1.  Okay for discharge from GI standpoint at any time once capsule endoscopy monitoring equipment is disconnected--no need to wait for passage of capsule, which might take several days.  I will notify patient of results.  2.  I will contact the patient with the pathology results on his duodenal polypoid lesion.  3.  Patient will need ongoing iron supplementation until he is confirmed to be no longer iron deficient.  4.  He will need outpatient follow-up with either a primary physician; that person can monitor Hemoccult status and evolution of his hemoglobin level.  If for some reason care is not available to the patient, I would certainly be willing to see him in my office to follow his GI issues (I have given him my card).  Unfortunately, I am concerned that this patient will not be compliant with medical follow-up.  5.  It is recalled that the patient has a high viral load from his hepatitis C, and will need referral to the infectious disease clinic for treatment of that.  Will sign off; please call me if you have any questions.  Cleotis Nipper, M.D. Pager [phone] If no answer or after 5 PM call [phone]

## 2019-07-01 NOTE — Progress Notes (Signed)
Telemetry called to inform RN of pt having 10 beats of V-tach; MD notified and she said she will come review; no new orders received. Will continue to closely monitor. Delia Heady RN

## 2019-07-01 NOTE — Progress Notes (Signed)
MD called RN concerning pt's output d/t no documentation of pt's output. Pt reported to RN he has been voiding in the urinal and that he voids. Urinal noted to be empty. Pt educated not to empty his urinal himself after usage because it has to be documented. Pt voices understanding and agrees to notify staff after voiding. NT informed to document I&O's on pt. Will continue to closely monitor. Delia Heady RN

## 2019-07-03 LAB — PROTEIN / CREATININE RATIO, URINE
Creatinine, Urine: 53.96 mg/dL
Protein Creatinine Ratio: 3.28 mg/mg{Cre} — ABNORMAL HIGH (ref 0.00–0.15)
Total Protein, Urine: 177 mg/dL

## 2019-07-04 ENCOUNTER — Telehealth: Payer: Self-pay

## 2019-07-04 NOTE — Telephone Encounter (Signed)
Transition Care Management Follow-up Telephone Call Date of discharge and from where: 07/01/2019, [facility].  Call placed to # [phone], message stated that call can't be completed at this time.   He has an appointment with Ms Oletta Lamas, NP 07/21/2019

## 2019-07-05 ENCOUNTER — Telehealth: Payer: Self-pay

## 2019-07-05 NOTE — Telephone Encounter (Signed)
Transition Care Management Follow-up Telephone Call Date of discharge and from where: 07/01/2019, [facility].  Call placed to # [phone], message stated that call can't be completed at this time.   Call placed to patient's brother, Josephina Gip # [phone], message left with call back from patient reuqested  He has an appointment with Ms Oletta Lamas, NP 07/21/2019

## 2019-07-06 ENCOUNTER — Inpatient Hospital Stay (HOSPITAL_COMMUNITY)
Admission: EM | Admit: 2019-07-06 | Discharge: 2019-07-07 | DRG: 378 | Disposition: A | Discharge status: Home / Self Care | Payer: Self-pay | Attending: Internal Medicine | Admitting: Internal Medicine

## 2019-07-06 ENCOUNTER — Emergency Department (HOSPITAL_COMMUNITY): Payer: Self-pay

## 2019-07-06 ENCOUNTER — Encounter (HOSPITAL_COMMUNITY): Payer: Self-pay | Admitting: Emergency Medicine

## 2019-07-06 ENCOUNTER — Other Ambulatory Visit: Payer: Self-pay

## 2019-07-06 DIAGNOSIS — N184 Chronic kidney disease, stage 4 (severe): Secondary | ICD-10-CM

## 2019-07-06 DIAGNOSIS — I129 Hypertensive chronic kidney disease with stage 1 through stage 4 chronic kidney disease, or unspecified chronic kidney disease: Secondary | ICD-10-CM

## 2019-07-06 DIAGNOSIS — R531 Weakness: Secondary | ICD-10-CM | POA: Diagnosis present

## 2019-07-06 DIAGNOSIS — N185 Chronic kidney disease, stage 5: Secondary | ICD-10-CM | POA: Diagnosis present

## 2019-07-06 DIAGNOSIS — F17210 Nicotine dependence, cigarettes, uncomplicated: Secondary | ICD-10-CM | POA: Diagnosis present

## 2019-07-06 DIAGNOSIS — R195 Other fecal abnormalities: Secondary | ICD-10-CM | POA: Diagnosis present

## 2019-07-06 DIAGNOSIS — D519 Vitamin B12 deficiency anemia, unspecified: Secondary | ICD-10-CM | POA: Diagnosis present

## 2019-07-06 DIAGNOSIS — M545 Low back pain: Secondary | ICD-10-CM

## 2019-07-06 DIAGNOSIS — M5441 Lumbago with sciatica, right side: Secondary | ICD-10-CM | POA: Diagnosis present

## 2019-07-06 DIAGNOSIS — D631 Anemia in chronic kidney disease: Secondary | ICD-10-CM | POA: Diagnosis present

## 2019-07-06 DIAGNOSIS — R197 Diarrhea, unspecified: Secondary | ICD-10-CM

## 2019-07-06 DIAGNOSIS — R3911 Hesitancy of micturition: Secondary | ICD-10-CM

## 2019-07-06 DIAGNOSIS — D649 Anemia, unspecified: Secondary | ICD-10-CM

## 2019-07-06 DIAGNOSIS — R0602 Shortness of breath: Secondary | ICD-10-CM

## 2019-07-06 DIAGNOSIS — E538 Deficiency of other specified B group vitamins: Secondary | ICD-10-CM

## 2019-07-06 DIAGNOSIS — Z79899 Other long term (current) drug therapy: Secondary | ICD-10-CM

## 2019-07-06 DIAGNOSIS — Z9889 Other specified postprocedural states: Secondary | ICD-10-CM

## 2019-07-06 DIAGNOSIS — M79606 Pain in leg, unspecified: Secondary | ICD-10-CM | POA: Diagnosis present

## 2019-07-06 DIAGNOSIS — B1920 Unspecified viral hepatitis C without hepatic coma: Secondary | ICD-10-CM | POA: Diagnosis present

## 2019-07-06 DIAGNOSIS — Z7289 Other problems related to lifestyle: Secondary | ICD-10-CM

## 2019-07-06 DIAGNOSIS — E1122 Type 2 diabetes mellitus with diabetic chronic kidney disease: Secondary | ICD-10-CM

## 2019-07-06 DIAGNOSIS — R32 Unspecified urinary incontinence: Secondary | ICD-10-CM

## 2019-07-06 DIAGNOSIS — K922 Gastrointestinal hemorrhage, unspecified: Principal | ICD-10-CM | POA: Diagnosis present

## 2019-07-06 DIAGNOSIS — I120 Hypertensive chronic kidney disease with stage 5 chronic kidney disease or end stage renal disease: Secondary | ICD-10-CM | POA: Diagnosis present

## 2019-07-06 DIAGNOSIS — D500 Iron deficiency anemia secondary to blood loss (chronic): Secondary | ICD-10-CM | POA: Diagnosis present

## 2019-07-06 DIAGNOSIS — Z20822 Contact with and (suspected) exposure to covid-19: Secondary | ICD-10-CM | POA: Diagnosis present

## 2019-07-06 LAB — URINALYSIS, ROUTINE W REFLEX MICROSCOPIC
Bilirubin Urine: NEGATIVE
Glucose, UA: NEGATIVE mg/dL
Ketones, ur: NEGATIVE mg/dL
Leukocytes,Ua: NEGATIVE
Nitrite: NEGATIVE
Protein, ur: 100 mg/dL — AB
Specific Gravity, Urine: 1.009 (ref 1.005–1.030)
pH: 5 (ref 5.0–8.0)

## 2019-07-06 LAB — BASIC METABOLIC PANEL
Anion gap: 14 (ref 5–15)
BUN: 57 mg/dL — ABNORMAL HIGH (ref 6–20)
CO2: 16 mmol/L — ABNORMAL LOW (ref 22–32)
Calcium: 8.2 mg/dL — ABNORMAL LOW (ref 8.9–10.3)
Chloride: 109 mmol/L (ref 98–111)
Creatinine, Ser: 7.24 mg/dL — ABNORMAL HIGH (ref 0.61–1.24)
GFR calc Af Amer: 9 mL/min — ABNORMAL LOW (ref >60)
GFR calc non Af Amer: 7 mL/min — ABNORMAL LOW (ref >60)
Glucose, Bld: 136 mg/dL — ABNORMAL HIGH (ref 70–99)
Potassium: 3.6 mmol/L (ref 3.5–5.1)
Sodium: 139 mmol/L (ref 135–145)

## 2019-07-06 LAB — CBC
HCT: 20.2 % — ABNORMAL LOW (ref 39.0–52.0)
Hemoglobin: 6.4 g/dL — CL (ref 13.0–17.0)
MCH: 35.2 pg — ABNORMAL HIGH (ref 26.0–34.0)
MCHC: 31.7 g/dL (ref 30.0–36.0)
MCV: 111 fL — ABNORMAL HIGH (ref 80.0–100.0)
Platelets: 126 10*3/uL — ABNORMAL LOW (ref 150–400)
RBC: 1.82 MIL/uL — ABNORMAL LOW (ref 4.22–5.81)
RDW: 17.5 % — ABNORMAL HIGH (ref 11.5–15.5)
WBC: 5.6 10*3/uL (ref 4.0–10.5)
nRBC: 1.1 % — ABNORMAL HIGH (ref 0.0–0.2)

## 2019-07-06 LAB — POC OCCULT BLOOD, ED: Fecal Occult Bld: POSITIVE — AB

## 2019-07-06 LAB — BRAIN NATRIURETIC PEPTIDE: B Natriuretic Peptide: 242.7 pg/mL — ABNORMAL HIGH (ref 0.0–100.0)

## 2019-07-06 LAB — PREPARE RBC (CROSSMATCH)

## 2019-07-06 LAB — CBG MONITORING, ED: Glucose-Capillary: 229 mg/dL — ABNORMAL HIGH (ref 70–99)

## 2019-07-06 MED ORDER — GABAPENTIN 300 MG PO CAPS
300.0000 mg | ORAL_CAPSULE | Freq: Two times a day (BID) | ORAL | Status: DC
  Administered 2019-07-07: 300 mg via ORAL
  Filled 2019-07-06 (×2): qty 1

## 2019-07-06 MED ORDER — ENOXAPARIN SODIUM 30 MG/0.3ML ~~LOC~~ SOLN: 30.0000 mg | SUBCUTANEOUS | Status: DC

## 2019-07-06 MED ORDER — FERROUS SULFATE 325 (65 FE) MG PO TABS
325.0000 mg | ORAL_TABLET | Freq: Two times a day (BID) | ORAL | Status: DC
  Administered 2019-07-06 – 2019-07-07 (×2): 325 mg via ORAL
  Filled 2019-07-06 (×2): qty 1

## 2019-07-06 MED ORDER — CLONIDINE HCL 0.2 MG PO TABS
0.2000 mg | ORAL_TABLET | Freq: Two times a day (BID) | ORAL | Status: DC
  Administered 2019-07-06 – 2019-07-07 (×2): 0.2 mg via ORAL
  Filled 2019-07-06 (×2): qty 1

## 2019-07-06 MED ORDER — INSULIN ASPART 100 UNIT/ML ~~LOC~~ SOLN
0.0000 [IU] | Freq: Every day | SUBCUTANEOUS | Status: DC
  Administered 2019-07-06: 2 [IU] via SUBCUTANEOUS

## 2019-07-06 MED ORDER — FOLIC ACID 1 MG PO TABS
1.0000 mg | ORAL_TABLET | Freq: Every day | ORAL | Status: DC
  Administered 2019-07-06 – 2019-07-07 (×2): 1 mg via ORAL
  Filled 2019-07-06 (×2): qty 1

## 2019-07-06 MED ORDER — ONDANSETRON HCL 4 MG/2ML IJ SOLN: 4.0000 mg | Freq: Four times a day (QID) | INTRAMUSCULAR | Status: DC | PRN

## 2019-07-06 MED ORDER — HYDROMORPHONE HCL 2 MG PO TABS: 1.0000 mg | ORAL_TABLET | Freq: Four times a day (QID) | ORAL | Status: DC | PRN

## 2019-07-06 MED ORDER — GABAPENTIN 300 MG PO CAPS
600.0000 mg | ORAL_CAPSULE | Freq: Every day | ORAL | Status: DC
  Administered 2019-07-06: 600 mg via ORAL
  Filled 2019-07-06: qty 2

## 2019-07-06 MED ORDER — SODIUM CHLORIDE 0.9% FLUSH: 3.0000 mL | Freq: Once | INTRAVENOUS | Status: DC

## 2019-07-06 MED ORDER — HYDROCODONE-ACETAMINOPHEN 5-325 MG PO TABS
1.0000 | ORAL_TABLET | Freq: Once | ORAL | Status: AC
  Administered 2019-07-06: 1 via ORAL
  Filled 2019-07-06: qty 1

## 2019-07-06 MED ORDER — ACETAMINOPHEN 325 MG PO TABS: 650.0000 mg | ORAL_TABLET | Freq: Four times a day (QID) | ORAL | Status: DC | PRN

## 2019-07-06 MED ORDER — ONDANSETRON HCL 4 MG PO TABS: 4.0000 mg | ORAL_TABLET | Freq: Four times a day (QID) | ORAL | Status: DC | PRN

## 2019-07-06 MED ORDER — SODIUM BICARBONATE 650 MG PO TABS
1300.0000 mg | ORAL_TABLET | Freq: Two times a day (BID) | ORAL | Status: DC
  Administered 2019-07-06 – 2019-07-07 (×2): 1300 mg via ORAL
  Filled 2019-07-06 (×2): qty 2

## 2019-07-06 MED ORDER — SODIUM CHLORIDE 0.9 % IV SOLN
10.0000 mL/h | Freq: Once | INTRAVENOUS | Status: AC
  Administered 2019-07-07: 10 mL/h via INTRAVENOUS

## 2019-07-06 MED ORDER — VITAMIN B-12 1000 MCG PO TABS
1000.0000 ug | ORAL_TABLET | Freq: Every day | ORAL | Status: DC
  Administered 2019-07-06 – 2019-07-07 (×2): 1000 ug via ORAL
  Filled 2019-07-06 (×2): qty 1

## 2019-07-06 MED ORDER — ACETAMINOPHEN 650 MG RE SUPP: 650.0000 mg | Freq: Four times a day (QID) | RECTAL | Status: DC | PRN

## 2019-07-06 MED ORDER — INSULIN ASPART 100 UNIT/ML ~~LOC~~ SOLN
0.0000 [IU] | Freq: Three times a day (TID) | SUBCUTANEOUS | Status: DC
  Administered 2019-07-07: 1 [IU] via SUBCUTANEOUS

## 2019-07-06 MED ORDER — AMLODIPINE BESYLATE 10 MG PO TABS
10.0000 mg | ORAL_TABLET | Freq: Every day | ORAL | Status: DC
  Administered 2019-07-07: 10 mg via ORAL
  Filled 2019-07-06: qty 2
  Filled 2019-07-06: qty 1

## 2019-07-06 NOTE — ED Provider Notes (Signed)
Patient signed out at end of shift by Janetta Hora, PA-C, pending needed admission for drop in hemoglobin. He presented today for back pain resulting from fall where "my legs gave out" He recently had a GI bleed, undetermined source, resulting in admission, d/ch x 5 days with plan to continue evaluation by GI in the outpatient setting.  He reports ongoing melena since discharge from the hospital Has normal color stool on exam, heme positive here Is anemic here to 6.4, a drop from 7.7 at discharge and needs transfusion   C/O back pain since fall yesterday, pending MRI for better evaluation  Plan: Needs to be admitted.  7:35 - discussed with [facility] admitting resident who accepts the patient for admission.   Charlann Lange, PA-C 07/06/19 Lattie Corns    [name], MD 07/08/19 [phone]

## 2019-07-06 NOTE — ED Notes (Signed)
Pt transported to xray 

## 2019-07-06 NOTE — H&P (Signed)
Date: 07/06/2019               Patient Name:  Daniel [name] MRN: [id number]  DOB: 09-19-1960 Age / Sex: 59 y.o., male   PCP: [name], NP         Medical Service: Internal Medicine Teaching Service         Attending Physician: Dr. Lucious Groves, DO    First Contact: Dr. Ronnald Ramp Pager: [phone]   Second Contact: Dr. Sharon Seller Pager: [phone]        After Hours (After 5p/  First Contact Pager: [phone]  weekends / holidays): Second Contact Pager: [phone]   Chief Complaint: SOB and weakness  History of Present Illness: Mannix Kroeker is a 59 y.o male with hypertension, CKD Stage IV, mild alcohol use disorder, B12 deficiency, and recent hospitalization for a GI bleed who presented to the ED with lower back pain and weakness. History was obtained via the patient and through chart review.   Patient states that approximately two days ago he noticed lower back pain that radiated down both his legs to his feet. This was associated with a feeling of weakness however no numbness. He states that the pain was worse with sitting up and walking and relieved when he would lie down or sit still. Because of the pain he did experience a fall last night while taking his trash to the dumpster. He did not experience loss of consciousness or hit his head. He was able to get back up and walk back to his house. He has never had pain like this before. He does acknowledge that he has had a history of pain in his feet and was recently started on gabapentin with minimal relief. He has had no changes in bowel function however has noticed urinary hesitancy and one episode of urinary incontinence. He has not had any fevers, chills, weight loss. He has no history of IV drug use.  In regards to his G.I. bleed. He was recently admitted from 3/1-3/5. During that hospitalization he received one unit of packed red cells. 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 Capsule endoscopy was performed; however, the results are not available. His hemoglobin had stabilized and he was recommended to follow-up as an outpatient. Since discharge he has continued to have black tarry stools. He denies any bright red blood per rectum. He denies any abdominal pain. He has not followed up with G.I. Denies a change in chronic SHOB but reports increased SOB when walking up stairs.  Meds:  Current Meds  Medication Sig  . amLODipine (NORVASC) 10 MG tablet Take 1 tablet (10 mg total) by mouth daily.  . cloNIDine (CATAPRES) 0.2 MG tablet Take  1 tablet twice daily . If makes drowsy take 2 tablets - after he is off of work (Patient taking differently: Take 0.2 mg by mouth See admin instructions. Take 0.2 mg by mouth two times a day and take after work if drowsiness occurs with this)  . ferrous sulfate 325 (65 FE) MG EC tablet Take 1 tablet (325 mg total) by mouth 2 (two) times daily.  [name] gabapentin (NEURONTIN) 300 MG capsule Patient takes 1 tablet twice a day and 2 tablets at night. (Patient taking differently: Take 300-600 mg by mouth See admin instructions. Take 300 mg by mouth two times a day and 600 mg at bedtime)  .  sodium bicarbonate 650 MG tablet Take 2 tablets (1,300 mg total) by mouth 2 (two) times daily.  . vitamin B-12 (CYANOCOBALAMIN) 1000 MCG tablet Take 1 tablet (1,000 mcg total) by mouth daily.   Allergies: Allergies as of 07/06/2019  . (No Known Allergies)   Past Medical History:  Diagnosis Date  . Diabetes mellitus without complication ([place])   . Hypertension   . ICH (intracerebral hemorrhage) ([place]) 05/20/2017  . Shoulder pain, left 06/28/2013   Past Surgical History:  Procedure Laterality Date  . APPENDECTOMY    . BIOPSY  06/30/2019   Procedure: BIOPSY;  Surgeon: Ronald Lobo, MD;  Location: [place];  Service: Endoscopy;;  . COLONOSCOPY   01/23/2012   Procedure: COLONOSCOPY;  Surgeon: Danie Binder, MD;  [facility];  Service: Endoscopy;  Laterality: N/A;  11:10 AM  . COLONOSCOPY WITH PROPOFOL N/A 06/30/2019   Procedure: COLONOSCOPY WITH PROPOFOL;  Surgeon: Ronald Lobo, MD;  Location: [place];  Service: Endoscopy;  Laterality: N/A;  . ESOPHAGOGASTRODUODENOSCOPY (EGD) WITH PROPOFOL N/A 06/30/2019   Procedure: ESOPHAGOGASTRODUODENOSCOPY (EGD) WITH PROPOFOL;  Surgeon: Ronald Lobo, MD;  Location: [place];  Service: Endoscopy;  Laterality: N/A;  . GIVENS CAPSULE STUDY N/A 06/30/2019   Procedure: GIVENS CAPSULE STUDY;  Surgeon: Ronald Lobo, MD;  Location: [place];  Service: Endoscopy;  Laterality: N/A;  . INCISION AND DRAINAGE ABSCESS N/A 06/29/2016   Procedure: INCISION AND DRAINAGE ABDOMINAL WALL ABSCESS;  Surgeon: Alphonsa Overall, MD;  Location: [facility];  Service: General;  Laterality: N/A;  . Left heel surgery     Family History:  Family History  Problem Relation Age of Onset  . Colon cancer Neg Hx    Social History:  Social History   Tobacco Use  . Smoking status: Current Every Day Smoker    Packs/day: 1.00    Years: 30.00    Pack years: 30.00    Types: Cigarettes  . Smokeless tobacco: Never Used  Substance Use Topics  . Alcohol use: Yes    Alcohol/week: 12.0 standard drinks    Types: 12 Cans of beer per week  . Drug use: Yes    Types: "Crack" cocaine   Review of Systems: A complete ROS was negative except as per HPI.   Review of Systems  Constitutional: Negative for chills and fever.  HENT: Negative for congestion and sore throat.   Respiratory: Positive for shortness of breath.   Cardiovascular: Positive for leg swelling. Negative for chest pain.  Gastrointestinal: Positive for diarrhea. Negative for abdominal pain.       Black tarry stools  Genitourinary:       Episode of incontinence  Musculoskeletal: Positive for falls.  Neurological: Negative for loss of consciousness.    Psychiatric/Behavioral: Negative for depression. The patient is not nervous/anxious.    Physical Exam Blood pressure (!) 152/99, pulse 62, temperature (!) 97.4 F (36.3 C), temperature source Axillary, resp. rate 13, SpO2 98 %.  Physical Exam  Constitutional: He is well-developed, well-nourished, and in no distress. No distress.  Appears uncomfortable, frequently repositioning   Cardiovascular: Normal rate, regular rhythm and normal heart sounds.  No murmur heard. 1+ pitting edema to the mid calf bilaterally  Pulmonary/Chest: Effort normal.  Bibasilar crackles  Abdominal: Soft. Bowel sounds are normal. He exhibits no distension. There is no abdominal tenderness.  Musculoskeletal:        General: No deformity. Normal range of motion.     Cervical back: Normal range of motion.  Neurological: He is  alert. Coordination normal.  Moves all 4 extremities equally  Skin: Skin is warm. He is not diaphoretic.  Nursing note and vitals reviewed.  Labs: Results for orders placed or performed during the hospital encounter of 07/06/19 (from the past 24 hour(s))  Basic metabolic panel     Status: Abnormal   Collection Time: 07/06/19  4:18 PM  Result Value Ref Range   Sodium 139 135 - 145 mmol/L   Potassium 3.6 3.5 - 5.1 mmol/L   Chloride 109 98 - 111 mmol/L   CO2 16 (L) 22 - 32 mmol/L   Glucose, Bld 136 (H) 70 - 99 mg/dL   BUN 57 (H) 6 - 20 mg/dL   Creatinine, Ser 7.24 (H) 0.61 - 1.24 mg/dL   Calcium 8.2 (L) 8.9 - 10.3 mg/dL   GFR calc non Af Amer 7 (L) >60 mL/min   GFR calc Af Amer 9 (L) >60 mL/min   Anion gap 14 5 - 15  CBC     Status: Abnormal   Collection Time: 07/06/19  4:18 PM  Result Value Ref Range   WBC 5.6 4.0 - 10.5 K/uL   RBC 1.82 (L) 4.22 - 5.81 MIL/uL   Hemoglobin 6.4 (LL) 13.0 - 17.0 g/dL   HCT 20.2 (L) 39.0 - 52.0 %   MCV 111.0 (H) 80.0 - 100.0 fL   MCH 35.2 (H) 26.0 - 34.0 pg   MCHC 31.7 30.0 - 36.0 g/dL   RDW 17.5 (H) 11.5 - 15.5 %   Platelets 126 (L) 150 - 400  K/uL   nRBC 1.1 (H) 0.0 - 0.2 %  Type and screen [place]     Status: None (Preliminary result)   Collection Time: 07/06/19  6:37 PM  Result Value Ref Range   ABO/RH(D) B POS    Antibody Screen NEG    Sample Expiration 07/09/2019,2359    Unit Number [id number]    Blood Component Type RED CELLS,LR    Unit division 00    Status of Unit ISSUED    Transfusion Status OK TO TRANSFUSE    Crossmatch Result      Compatible Performed at [facility], [address] [place], [place] [address]   Prepare RBC     Status: None   Collection Time: 07/06/19  6:37 PM  Result Value Ref Range   Order Confirmation      ORDER PROCESSED BY BLOOD BANK Performed at [facility], [place] [address] [place], [place] [address]   POC occult blood, ED Provider will collect     Status: Abnormal   Collection Time: 07/06/19  6:49 PM  Result Value Ref Range   Fecal Occult Bld POSITIVE (A) NEGATIVE  CBG monitoring, ED     Status: Abnormal   Collection Time: 07/06/19 10:36 PM  Result Value Ref Range   Glucose-Capillary 229 (H) 70 - 99 mg/dL  Urinalysis, Routine w reflex microscopic     Status: Abnormal   Collection Time: 07/06/19 10:52 PM  Result Value Ref Range   Color, Urine YELLOW YELLOW   APPearance CLEAR CLEAR   Specific Gravity, Urine 1.009 1.005 - 1.030   pH 5.0 5.0 - 8.0   Glucose, UA NEGATIVE NEGATIVE mg/dL   Hgb urine dipstick SMALL (A) NEGATIVE   Bilirubin Urine NEGATIVE NEGATIVE   Ketones, ur NEGATIVE NEGATIVE mg/dL   Protein, ur 100 (A) NEGATIVE mg/dL   Nitrite NEGATIVE NEGATIVE   Leukocytes,Ua NEGATIVE NEGATIVE   RBC / HPF  0-5 0 - 5 RBC/hpf   WBC, UA 0-5 0 - 5 WBC/hpf   Bacteria, UA RARE (A) NONE SEEN   EKG: personally reviewed my interpretation is NSR without acute ischemic changes   CXR: personally reviewed my interpretation is cardiomegaly without acute cardiopulmonary abnormalities   DG Chest 2 View  Result Date: 07/06/2019 CLINICAL DATA:   Shortness of breath and leg weakness. EXAM: CHEST - 2 VIEW COMPARISON:  06/27/2019 FINDINGS: Cardiomegaly. Possible venous hypertension without frank edema. Mild atelectasis or infiltrate at the left base, similar to the previous study. No dense consolidation or lobar collapse. No effusion. No acute bone finding. IMPRESSION: Cardiomegaly and pulmonary venous hypertension without frank edema. Redemonstration of mild atelectasis or infiltrate at the left base. Electronically Signed   By: Nelson Chimes M.D.   On: 07/06/2019 16:40   Assessment:  Mr. Lindahl is a 59 yo M with a PMHx of HTN, DM2, CKD IIIB, AUD, untreated Hep C and a recent admission for GI bleed and symptomatic anemia here with increased SOB and new lower extremity weakness s/p fall found to have a Hgb of 6.4 and a positive FOBT.   Plan by Problem:  SOB: -pt presenting with SOB he initially describes as unchanged from baseline however he states that he has had increased SOB when walking up stairs which is not normal for him -he has no infectious sx  -satting well on room air without tachypnea -EKG shows NSR without acute ischemic changes -pt does have bibasilar crackles and LE edema on exam -sx could be secondary to anemia of 6.4, has had sx anemia prior, or could be due to volume overload of which patient has no prior hx; prior Echo with EF 55-60% (04/2017)  Plan: -transfusing 1 U PRBCs -obtaining BNP to evaluate for volume overload -continue ferrous sulfate and folic acid supplementation -cardiac monitoring  -continuous pulse ox  LE Weakness: -pt presenting with 2 days of lower back pain that radiates down the legs bilaterally -describes pain as burning and worse when standing or walking and improved with sitting down or laying still -he has never had sx like this before -he has associated lower extremity weakness secondary to pain but no numbness -no genitoanal numbness -of note, he has had an episode of urinary  incontinence -sx consistent with sciatic pain; low concern for cauda equina although episode of urinary incontinence is new and concerning   Plan: -X-ray lumbar spine -MRI lumbar spine -continue home gabapentin  -dilaudid for pain control  GI Bleed: -pt with recent discharge for symptomatic anemia in setting of GI bleed for which he received one unit of packed red cells. 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 Capsule endoscopy was performed; however, the results are not available. His hemoglobin had stabilized and he was recommended to follow-up as an outpatient. Since discharge he has continued to have black tarry stools. He denies any bright red blood per rectum. He denies any abdominal pain. He has not followed up with G.I.  -Hgb 6.4 on presentation to the ED -patient having some increased SOB when going up the stairs which may represent symptomatic anemia  Plan: -transfuse 1 U PRBCs -daily CBC -transfuse < 7 -will need close fu with GI  CKDIIIB: -Creatinine 7.24 up slightly from 6.41 at discharge -pt has had one episode of urinary incontinence which is new for him -was seen by nephrology inpatient and was scheduled to see them outpatient  as well but has not yet follow up   Plan: -UA -avoid nephrotoxic meds -follow with BMP  HTN: -continue home amlodipine and clonidine  DM2: -SSI  Active Problems:   GI bleed  Dispo: Admit patient to Inpatient with expected length of stay greater than 2 midnights.  Signed: Al Decant, MD 07/06/2019, 11:02 PM  Pager: [phone]

## 2019-07-06 NOTE — ED Notes (Signed)
Pt transported to MRI 

## 2019-07-06 NOTE — ED Triage Notes (Signed)
Pt reports SOB, weakness to bilateral legs, and state legs just give out for the past few days.

## 2019-07-06 NOTE — ED Provider Notes (Signed)
[facility] EMERGENCY DEPARTMENT Provider Note   CSN: [id number] Arrival date & time: 07/06/19  1545     History Chief Complaint  Patient presents with  . Shortness of Breath  . Weakness    Daniel [name] is a 59 y.o. male with history of DM, HTN, CKD not yet on dialysis, anemia, Hep C who presents with back pain and leg weakness. He states that since he was discharged from the hospital on 3/5 for upper GI bleed and CAP. He was seen by GI and had an EGD and colonoscopy. There was some old blood in the terminal ileum and therefore a capsule endoscopy was performed. He received a blood transfusion. He was also seen by nephrology due to acute on chronic kidney disease and outpatient kidney biopsy was recommended. His SCr on d/c was 6.4. Hgb was 7.7. He returns today because he's been having low back pain which radiates down both legs. He reports associated leg weakness as well and had a fall yesterday because they suddenly gave out on him. He denies injuring himself from the fall and was able to get up. He is normally ambulatory. He denies bowel/bladder incontinence or numbness/tingling. He had a MRI of the lumbar spine in 2019 which showed mild disc disease. He reported SOB in triage but states he's always SOB and seems unchanged. He denies fever, chills, chest pain, cough, abdominal pain.  HPI     Past Medical History:  Diagnosis Date  . Diabetes mellitus without complication ([place])   . Hypertension   . ICH (intracerebral hemorrhage) ([place]) 05/20/2017  . Shoulder pain, left 06/28/2013    Patient Active Problem List   Diagnosis Date Noted  . GI bleed 06/28/2019  . Upper GI bleed 06/27/2019  . Iron deficiency anemia 03/30/2019  . CKD (chronic kidney disease) stage 4, GFR 15-29 ml/min (H[facility]) 03/30/2019  . Alcohol use disorder 03/30/2019  . B12 deficiency 03/30/2019  . Symptomatic anemia 03/29/2019  . Anemia   . Heme positive stool   . Hypertension 06/29/2016  .  DM2 (diabetes mellitus, type 2) ([place]) 06/29/2016    Past Surgical History:  Procedure Laterality Date  . APPENDECTOMY    . BIOPSY  06/30/2019   Procedure: BIOPSY;  Surgeon: Ronald Lobo, MD;  Location: [facility];  Service: Endoscopy;;  . COLONOSCOPY  01/23/2012   Procedure: COLONOSCOPY;  Surgeon: Danie Binder, MD;  [facility];  Service: Endoscopy;  Laterality: N/A;  11:10 AM  . COLONOSCOPY WITH PROPOFOL N/A 06/30/2019   Procedure: COLONOSCOPY WITH PROPOFOL;  Surgeon: Ronald Lobo, MD;  Location: [facility];  Service: Endoscopy;  Laterality: N/A;  . ESOPHAGOGASTRODUODENOSCOPY (EGD) WITH PROPOFOL N/A 06/30/2019   Procedure: ESOPHAGOGASTRODUODENOSCOPY (EGD) WITH PROPOFOL;  Surgeon: Ronald Lobo, MD;  Location: [facility];  Service: Endoscopy;  Laterality: N/A;  . GIVENS CAPSULE STUDY N/A 06/30/2019   Procedure: GIVENS CAPSULE STUDY;  Surgeon: Ronald Lobo, MD;  Location: [facility];  Service: Endoscopy;  Laterality: N/A;  . INCISION AND DRAINAGE ABSCESS N/A 06/29/2016   Procedure: INCISION AND DRAINAGE ABDOMINAL WALL ABSCESS;  Surgeon: Alphonsa Overall, MD;  Location: [facility];  Service: General;  Laterality: N/A;  . Left heel surgery         Family History  Problem Relation Age of Onset  . Colon cancer Neg Hx     Social History   Tobacco Use  . Smoking status: Current Every Day Smoker    Packs/day: 1.00    Years: 30.00  Pack years: 30.00    Types: Cigarettes  . Smokeless tobacco: Never Used  Substance Use Topics  . Alcohol use: Yes    Alcohol/week: 12.0 standard drinks    Types: 12 Cans of beer per week  . Drug use: Yes    Types: "Crack" cocaine    Home Medications Prior to Admission medications   Medication Sig Start Date End Date Taking? Authorizing Provider  amLODipine (NORVASC) 10 MG tablet Take 1 tablet (10 mg total) by mouth daily. 06/23/19 06/22/20 Yes [name], NP  cloNIDine (CATAPRES) 0.2 MG tablet Take  1 tablet twice daily . If  makes drowsy take 2 tablets - after he is off of work Patient taking differently: Take 0.2 mg by mouth 2 (two) times daily. Take  1 tablet twice daily . If makes drowsy take 2 tablets - after he is off of work 180on 02// 06/23/19  Yes Edwards, Milford Cage, NP  ferrous sulfate 325 (65 FE) MG EC tablet Take 1 tablet (325 mg total) by mouth 2 (two) times daily. 07/01/19 07/31/19 Yes Ladona Horns, MD  gabapentin (NEURONTIN) 300 MG capsule Patient takes 1 tablet twice a day and 2 tablets at night. Patient taking differently: Take 300-600 mg by mouth See admin instructions. Take 300 mg by mouth two times a day and 600 mg at bedtime 06/23/19  Yes [name] P, NP  sodium bicarbonate 650 MG tablet Take 2 tablets (1,300 mg total) by mouth 2 (two) times daily. 07/01/19  Yes Ladona Horns, MD  vitamin B-12 (CYANOCOBALAMIN) 1000 MCG tablet Take 1 tablet (1,000 mcg total) by mouth daily. 07/01/19 07/31/19 Yes Ladona Horns, MD  blood glucose meter kit and supplies KIT Dispense based on patient and insurance preference. Use up to four times daily as directed. (FOR ICD-9 250.00, 250.01). 07/01/16   Nita Sells, MD  folic acid (FOLVITE) 1 MG tablet Take 1 tablet (1 mg total) by mouth daily. 01/25/19   Kathrene Alu, MD    Allergies    Patient has no known allergies.  Review of Systems   Review of Systems  Constitutional: Negative for chills and fever.  Respiratory: Positive for shortness of breath (chronic). Negative for cough and wheezing.   Cardiovascular: Positive for leg swelling. Negative for chest pain.  Gastrointestinal: Negative for abdominal pain, nausea and vomiting.       +black stools  Neurological: Positive for weakness (leg). Negative for headaches.  All other systems reviewed and are negative.   Physical Exam Updated Vital Signs BP (!) 147/73 (BP Location: Left Arm)   Pulse 80   Temp 98.9 F (37.2 C) (Oral)   Resp (!) 24   SpO2 96%   Physical Exam Vitals and nursing note reviewed.   Constitutional:      General: He is not in acute distress.    Appearance: He is well-developed. He is obese. He is not ill-appearing.     Comments: Chronically ill appearing  HENT:     Head: Normocephalic and atraumatic.  Eyes:     General: No scleral icterus.       Right eye: No discharge.        Left eye: No discharge.     Conjunctiva/sclera: Conjunctivae normal.     Pupils: Pupils are equal, round, and reactive to light.  Cardiovascular:     Rate and Rhythm: Normal rate and regular rhythm.  Pulmonary:     Effort: Pulmonary effort is normal. No respiratory distress.  Breath sounds: Rales (in bilateral bases) present.  Abdominal:     General: There is no distension.     Palpations: Abdomen is soft.     Tenderness: There is no abdominal tenderness.  Genitourinary:    Comments: Rectal: Brown stool. No melena. Normal rectal tone. Chaperone present during exam.  Musculoskeletal:     Cervical back: Normal range of motion.     Comments: Back: Inspection: No masses, deformity, or rash Palpation: Mild lower lumbar tenderness Strength: 4/5 in lower extremities and normal plantar and dorsiflexion Sensation: Intact sensation with light touch in lower extremities bilaterally Reflexes: Patellar reflex is 2+ bilaterally SLR: Positive seated straight leg raise bilaterally Gait: Not tested   Skin:    General: Skin is warm and dry.  Neurological:     Mental Status: He is alert and oriented to person, place, and time.  Psychiatric:        Behavior: Behavior normal.     ED Results / Procedures / Treatments   Labs (all labs ordered are listed, but only abnormal results are displayed) Labs Reviewed  BASIC METABOLIC PANEL - Abnormal; Notable for the following components:      Result Value   CO2 16 (*)    Glucose, Bld 136 (*)    BUN 57 (*)    Creatinine, Ser 7.24 (*)    Calcium 8.2 (*)    GFR calc non Af Amer 7 (*)    GFR calc Af Amer 9 (*)    All other components within  normal limits  CBC - Abnormal; Notable for the following components:   RBC 1.82 (*)    Hemoglobin 6.4 (*)    HCT 20.2 (*)    MCV 111.0 (*)    MCH 35.2 (*)    RDW 17.5 (*)    Platelets 126 (*)    nRBC 1.1 (*)    All other components within normal limits  POC OCCULT BLOOD, ED - Abnormal; Notable for the following components:   Fecal Occult Bld POSITIVE (*)    All other components within normal limits  URINALYSIS, ROUTINE W REFLEX MICROSCOPIC  TYPE AND SCREEN  PREPARE RBC (CROSSMATCH)    EKG None  Radiology DG Chest 2 View  Result Date: 07/06/2019 CLINICAL DATA:  Shortness of breath and leg weakness. EXAM: CHEST - 2 VIEW COMPARISON:  06/27/2019 FINDINGS: Cardiomegaly. Possible venous hypertension without frank edema. Mild atelectasis or infiltrate at the left base, similar to the previous study. No dense consolidation or lobar collapse. No effusion. No acute bone finding. IMPRESSION: Cardiomegaly and pulmonary venous hypertension without frank edema. Redemonstration of mild atelectasis or infiltrate at the left base. Electronically Signed   By: Nelson Chimes M.D.   On: 07/06/2019 16:40    Procedures Procedures (including critical care time)  Medications Ordered in ED Medications  sodium chloride flush (NS) 0.9 % injection 3 mL (has no administration in time range)  0.9 %  sodium chloride infusion (has no administration in time range)  HYDROcodone-acetaminophen (NORCO/VICODIN) 5-325 MG per tablet 1 tablet (1 tablet Oral Given 07/06/19 1805)    ED Course  I have reviewed the triage vital signs and the nursing notes.  Pertinent labs & imaging results that were available during my care of the patient were reviewed by me and considered in my medical decision making (see chart for details).  59 year old male presents with low back pain and leg weakness.  Vital signs are reassuring here.  On exam he has mild low  back tenderness and mild bilateral lower extremity weakness.  Rectal exam  shows normal rectal tone and stool is brown.  Heart is regular rate and rhythm.  He has rales in the bilateral lung bases.  Abdomen is soft and nontender.  EKG is normal sinus rhythm.  Chest x-ray shows cardiomegaly and pulmonary venous hypertension with mild atelectasis versus infiltrate in the left lung base.  CBC is remarkable for worsening anemia.  Hemoglobin is 6.4.  Will order type and screen and unit of blood.  Serum creatinine is slightly worse and is up to 7.2 today.  Bicarb is chronically low.  Patient states he has been taking his medicines.  Shared visit with Dr. Ralene Bathe.  We will add on MRI of the lumbar spine and admit to medicine.  MDM Rules/Calculators/A&P                       Final Clinical Impression(s) / ED Diagnoses Final diagnoses:  Anemia, unspecified type  Acute midline low back pain with bilateral sciatica    Rx / DC Orders ED Discharge Orders    None       Recardo Evangelist, PA-C 07/06/19 1933    [name], MD 07/08/19 1740

## 2019-07-06 NOTE — ED Notes (Signed)
Hgb 6.4

## 2019-07-07 DIAGNOSIS — K922 Gastrointestinal hemorrhage, unspecified: Principal | ICD-10-CM

## 2019-07-07 DIAGNOSIS — N185 Chronic kidney disease, stage 5: Secondary | ICD-10-CM

## 2019-07-07 DIAGNOSIS — I120 Hypertensive chronic kidney disease with stage 5 chronic kidney disease or end stage renal disease: Secondary | ICD-10-CM

## 2019-07-07 DIAGNOSIS — R531 Weakness: Secondary | ICD-10-CM

## 2019-07-07 DIAGNOSIS — D62 Acute posthemorrhagic anemia: Secondary | ICD-10-CM

## 2019-07-07 DIAGNOSIS — Z79899 Other long term (current) drug therapy: Secondary | ICD-10-CM

## 2019-07-07 LAB — TYPE AND SCREEN
ABO/RH(D): B POS
Antibody Screen: NEGATIVE
Unit division: 0

## 2019-07-07 LAB — BASIC METABOLIC PANEL
Anion gap: 13 (ref 5–15)
BUN: 56 mg/dL — ABNORMAL HIGH (ref 6–20)
CO2: 18 mmol/L — ABNORMAL LOW (ref 22–32)
Calcium: 8.3 mg/dL — ABNORMAL LOW (ref 8.9–10.3)
Chloride: 108 mmol/L (ref 98–111)
Creatinine, Ser: 6.94 mg/dL — ABNORMAL HIGH (ref 0.61–1.24)
GFR calc Af Amer: 9 mL/min — ABNORMAL LOW (ref >60)
GFR calc non Af Amer: 8 mL/min — ABNORMAL LOW (ref >60)
Glucose, Bld: 176 mg/dL — ABNORMAL HIGH (ref 70–99)
Potassium: 3.4 mmol/L — ABNORMAL LOW (ref 3.5–5.1)
Sodium: 139 mmol/L (ref 135–145)

## 2019-07-07 LAB — CBC WITH DIFFERENTIAL/PLATELET
Abs Immature Granulocytes: 0.05 10*3/uL (ref 0.00–0.07)
Basophils Absolute: 0 10*3/uL (ref 0.0–0.1)
Basophils Relative: 0 %
Eosinophils Absolute: 0.1 10*3/uL (ref 0.0–0.5)
Eosinophils Relative: 2 %
HCT: 23.3 % — ABNORMAL LOW (ref 39.0–52.0)
Hemoglobin: 7.6 g/dL — ABNORMAL LOW (ref 13.0–17.0)
Immature Granulocytes: 1 %
Lymphocytes Relative: 41 %
Lymphs Abs: 2.3 10*3/uL (ref 0.7–4.0)
MCH: 34.7 pg — ABNORMAL HIGH (ref 26.0–34.0)
MCHC: 32.6 g/dL (ref 30.0–36.0)
MCV: 106.4 fL — ABNORMAL HIGH (ref 80.0–100.0)
Monocytes Absolute: 0.5 10*3/uL (ref 0.1–1.0)
Monocytes Relative: 9 %
Neutro Abs: 2.6 10*3/uL (ref 1.7–7.7)
Neutrophils Relative %: 47 %
Platelets: 110 10*3/uL — ABNORMAL LOW (ref 150–400)
RBC: 2.19 MIL/uL — ABNORMAL LOW (ref 4.22–5.81)
RDW: 19 % — ABNORMAL HIGH (ref 11.5–15.5)
WBC: 5.6 10*3/uL (ref 4.0–10.5)
nRBC: 1.1 % — ABNORMAL HIGH (ref 0.0–0.2)

## 2019-07-07 LAB — BPAM RBC
Blood Product Expiration Date: 202103302359
ISSUE DATE / TIME: 202103102046
Unit Type and Rh: 7300

## 2019-07-07 LAB — GLUCOSE, CAPILLARY
Glucose-Capillary: 144 mg/dL — ABNORMAL HIGH (ref 70–99)
Glucose-Capillary: 109 mg/dL — ABNORMAL HIGH (ref 70–99)
Glucose-Capillary: 138 mg/dL — ABNORMAL HIGH (ref 70–99)

## 2019-07-07 LAB — SARS CORONAVIRUS 2 (TAT 6-24 HRS): SARS Coronavirus 2: NEGATIVE

## 2019-07-07 MED ORDER — FUROSEMIDE 10 MG/ML IJ SOLN
120.0000 mg | Freq: Once | INTRAVENOUS | Status: AC
  Administered 2019-07-07: 120 mg via INTRAVENOUS
  Filled 2019-07-07: qty 12

## 2019-07-07 NOTE — Discharge Summary (Signed)
Name: Daniel [name] MRN: [id number] DOB: [birth year] 59 y.o. PCP: [name], NP  Date of Admission: 07/06/2019  3:48 PM Date of Discharge: 07/07/2019 Attending Physician: Lucious Groves, DO  Discharge Diagnosis: 1. Low back pain  2. Acute on chronic anemia 3. Suspected GI bleed  Discharge Medications: Allergies as of 07/07/2019   No Known Allergies     Medication List    TAKE these medications   amLODipine 10 MG tablet Commonly known as: NORVASC Take 1 tablet (10 mg total) by mouth daily.   blood glucose meter kit and supplies Kit Dispense based on patient and insurance preference. Use up to four times daily as directed. (FOR ICD-9 250.00, 250.01).   cloNIDine 0.2 MG tablet Commonly known as: CATAPRES Take  1 tablet twice daily . If makes drowsy take 2 tablets - after he is off of work What changed:   how much to take  how to take this  when to take this  additional instructions   ferrous sulfate 325 (65 FE) MG EC tablet Take 1 tablet (325 mg total) by mouth 2 (two) times daily.   folic acid 1 MG tablet Commonly known as: FOLVITE Take 1 tablet (1 mg total) by mouth daily.   gabapentin 300 MG capsule Commonly known as: NEURONTIN Patient takes 1 tablet twice a day and 2 tablets at night. What changed:   how much to take  how to take this  when to take this  additional instructions   sodium bicarbonate 650 MG tablet Take 2 tablets (1,300 mg total) by mouth 2 (two) times daily.   vitamin B-12 1000 MCG tablet Commonly known as: CYANOCOBALAMIN Take 1 tablet (1,000 mcg total) by mouth daily.       Disposition and follow-up:   DanielFredderick C [name] was discharged from [facility] in Stable condition.  At the hospital follow up visit please address:  1.  Low back pain  - likely secondary to degenerative disc disease and recent immobility during prior hospital stay - MRI with disc bulging with bilateral foraminal  prominence with potential for compression of either or both L3 and L4 nerves - instructed pt to take tylenol and AVOID NSAIDs secondary to kidney   2. Acute on chronic anemia Suspected GI bleed - Hgb 6.4 on admission - transfused 1 unit with improvement to 7.6 - needs GI follow-up for results of capsule endoscopy - continued monitoring of pt's anemia and need for continued transfusions  3. CKD stage 5 - Cr remains elevated at >7 - GFR 9 - pt needs nephrology follow-up for outpatient dialysis - avoid NSAIDs and nephrotoxic medications  4. Hep C - outpatient appointment with ID scheduled for 3/15 for Hep C work-up/treatment   2.  Labs / imaging needed at time of follow-up:  - nephrology to complete outpatient renal biopsy - follow-up CBC and hemoccult   3.  Pending labs/ test needing follow-up: none  Follow-up Appointments: Follow-up [facility] for Infectious Disease. Go on 07/11/2019.   Specialty: Infectious Diseases Why: An appointment has been scheduled with the infectious disease doctor's on Monday March 15th on 10:30AM Contact information: [address] [place], [place] [id number] [place] [phone]       Kidney, [place]. Call.   Why: Schedule an appointment with the kidney doctors in the next week for monitoring of your kidney function and biopsy. Contact information: [address] [place] [address]  [phone]        Gastroenterology, [name]. Call.   Why: Call for the results of the endoscopy scan completed in the hospital. This was done by Dr. Cristina Gong when you were in the hospital on 07/01/2019. Contact information: [address] [place] [address] [phone]           Hospital Course by problem list: 1. Daniel [name] is a 59 year old gentleman with past medical history of CKD stage IV, alcohol use disorder B12 deficiency and hepatitis C who was recently admitted for iron  deficiency anemia secondary to presumed GI losses complicated by progressive chronic kidney disease.  He presented to the ED with chief complaint of back pain associated with shortness of breath and generalized weakness. Found to have a mildly lower hemoglobin than his last discharge (7.7>>6.4). Pain worsened by movement and relieved with laying down or sitting still.  X-rays revealed mild degenerative changes and MRI of the lumbar spine showed arthritis and bulging disc but with no emergent compression of cord or nerve roots.  Treated with a unit of packed red blood cells and weakness improved.  Back pain improved with Norco 5-333m x 1, but remained bothersome.  He needs to follow-up with GI and nephrology (as he still does not want to pursue a renal biopsy inpatient).  Pt stable for discharge on hospital day 1 but has a few chronic medical conditions that need prolonged care. He was discharged with explicit instructions on his follow-up appointments with his PCP, nephrology, ID, and GI.  Suspected his back pain worsened from his recent hospitalization and it is due to his degenerative disc disease. Recommended Tylenol for the acute exacerbation and ice/heat/stretching at home. Gave instructions to avoid NSAIDs given his CKD.  Discharge Vitals:   BP (!) 158/91   Pulse 67   Temp 98.1 F (36.7 C) (Oral)   Resp 18   Ht 5' 11" (1.803 m)   Wt 120.6 kg   SpO2 100%   BMI 37.09 kg/m   Pertinent Labs, Studies, and Procedures:  CBC Latest Ref Rng & Units 07/07/2019 07/06/2019 07/01/2019  WBC 4.0 - 10.5 K/uL 5.6 5.6 4.4  Hemoglobin 13.0 - 17.0 g/dL 7.6(L) 6.4(LL) 7.7(L)  Hematocrit 39.0 - 52.0 % 23.3(L) 20.2(L) 23.8(L)  Platelets 150 - 400 K/uL 110(L) 126(L) 107(L)   BMP Latest Ref Rng & Units 07/07/2019 07/06/2019 07/01/2019  Glucose 70 - 99 mg/dL 176(H) 136(H) 153(H)  BUN 6 - 20 mg/dL 56(H) 57(H) 51(H)  Creatinine 0.61 - 1.24 mg/dL 6.94(H) 7.24(H) 6.41(H)  Sodium 135 - 145 mmol/L 139 139 141    Potassium 3.5 - 5.1 mmol/L 3.4(L) 3.6 3.8  Chloride 98 - 111 mmol/L 108 109 113(H)  CO2 22 - 32 mmol/L 18(L) 16(L) 17(L)  Calcium 8.9 - 10.3 mg/dL 8.3(L) 8.2(L) 8.1(L)   Urinalysis    Component Value Date/Time   COLORURINE YELLOW 07/06/2019 2252   APPEARANCEUR CLEAR 07/06/2019 2252   LABSPEC 1.009 07/06/2019 2252   PHURINE 5.0 07/06/2019 2252   GLUCOSEU NEGATIVE 07/06/2019 2252   HGBUR SMALL (A) 07/06/2019 2252   BILIRUBINUR NEGATIVE 07/06/2019 2252   KETONESUR NEGATIVE 07/06/2019 2252   PROTEINUR 100 (A) 07/06/2019 2252   UROBILINOGEN 0.2 12/31/2009 0746   NITRITE NEGATIVE 07/06/2019 2252   LEUKOCYTESUR NEGATIVE 07/06/2019 2252   DG Chest 2 View  Result Date: 07/06/2019 CLINICAL DATA:  Shortness of breath and leg weakness. EXAM: CHEST - 2 VIEW COMPARISON:  06/27/2019 FINDINGS: Cardiomegaly. Possible venous hypertension without  frank edema. Mild atelectasis or infiltrate at the left base, similar to the previous study. No dense consolidation or lobar collapse. No effusion. No acute bone finding. IMPRESSION: Cardiomegaly and pulmonary venous hypertension without frank edema. Redemonstration of mild atelectasis or infiltrate at the left base. Electronically Signed   By: Nelson Chimes M.D.   On: 07/06/2019 16:40   DG Lumbar Spine Complete  Result Date: 07/06/2019 CLINICAL DATA:  Back pain EXAM: LUMBAR SPINE - COMPLETE 4+ VIEW COMPARISON:  05/27/17 CT FINDINGS: Lumbar alignment within normal limits. Mild chronic wedging T12. Remaining vertebral body heights are within normal limits. Mild disc space narrowing at L4-L5. Mild degenerative osteophytes throughout the lumbar spine. Aortic atherosclerosis. Facet degenerative change of the lower lumbar spine IMPRESSION: No acute osseous abnormality. Mild degenerative changes of the spine Electronically Signed   By: Donavan Foil M.D.   On: 07/06/2019 19:01   MR LUMBAR SPINE WO CONTRAST  Result Date: 07/06/2019 CLINICAL DATA:  Low back pain with  bilateral leg weakness. Symptoms worsening recently. EXAM: MRI LUMBAR SPINE WITHOUT CONTRAST TECHNIQUE: Multiplanar, multisequence MR imaging of the lumbar spine was performed. No intravenous contrast was administered. COMPARISON:  Radiography same day. MRI 05/29/2017 FINDINGS: Segmentation:  5 lumbar type vertebral bodies. Alignment:  Normal Vertebrae:  No fracture or primary bone lesion. Conus medullaris and cauda equina: Conus extends to the L1 level. Conus and cauda equina appear normal. Paraspinal and other soft tissues: Negative Disc levels: T11-12 and T12-L1: Moderate bulging of the discs cause indentation of the ventral thecal sac but do not compress the cord. L1-2: Normal L2-3: Mild disc bulge. No stenosis. L3-4: Moderate disc bulge with bilateral foraminal to extraforaminal prominence. No central canal stenosis. Potential for compression of either or both L3 nerves. This is slightly more pronounced on the left. Mild facet osteoarthritis. L4-5: Bulging of the disc, again with bilateral foraminal to extraforaminal prominence. No central canal stenosis. Some potential for irritation of either L4 nerve. Mild facet osteoarthritis. L5-S1: Minimal disc bulge. Mild facet degeneration. No compressive stenosis. IMPRESSION: 1. L3-4: Disc bulge with bilateral foraminal to extraforaminal prominence. Potential for compression of either or both L3 nerves. Mild facet osteoarthritis. Similar appearance to the study of 2019. 2. L4-5: Bulging of the disc with bilateral foraminal to extraforaminal prominence. Some potential for irritation of either L4 nerve. Mild facet osteoarthritis. Similar appearance to the study of 2019. Electronically Signed   By: Nelson Chimes M.D.   On: 07/06/2019 20:49   Discharge Instructions: Discharge Instructions    Call MD for:   Complete by: As directed    Decreased urination, shortness of breath, sudden onset chest pain, lower extremity swelling, fevers or chills, or red blood in your  stools.   Call MD for:  persistant nausea and vomiting   Complete by: As directed    Call MD for:  temperature >100.4   Complete by: As directed    Diet - low sodium heart healthy   Complete by: As directed    Discharge instructions   Complete by: As directed    Mr. Robey, Massmann were seen in the hospital for low back pain.   LOW BACK PAIN - An MRI shows that this is likely related to arthritis around the nerves of the back. You may take Tylenol as needed at home for the pain or using a heating pad on the back. It will also be beneficial to stay active and stretch the back. Your primary provider can continue to manage  this pain in their office.  ANEMIA - You continue to have low blood counts and require blood transfusions. Please take iron, B12, and folate supplements at home. Follow-up with your primary provider and GI doctors, with [facility], for further monitoring.  WORSENING KIDNEY FUNCTION - Follow-up with the [facility] doctors for a kidney biopsy to determine the cause of your worsening kidney function. Do not take ibuprofen or any NSAID medication, as these can further damage your kidneys. Please take sodium bicarb twice a daily as this will protect your kidneys from worsening.  HEPATITIS C - Follow-up with the infectious disease clinic on March 15th at 10:30AM for additional blood work and treatment of your hepatitis C.  Thank you for letting us be a part of your care!   Increase activity slowly   Complete by: As directed       Signed: Ladona Horns, MD 07/07/2019, 3:42 PM   Pager: [phone]

## 2019-07-07 NOTE — ED Notes (Signed)
Pt transported to [facility] on continuous monitors with all belongings by this RN. Pt alert, speaking in full sentences. Breathing easy, non-labored. Equal rise and fall of chest noted. VSS.

## 2019-07-07 NOTE — Progress Notes (Signed)
Subjective: Pt seen at the bedside this morning. States he presented to the ED yesterday for low back pain that radiated down both of his legs. Denies any current pain while laying down in bed. Endorses continued dark stools.   Pt is interested in discharge today. Would like to follow-up with nephrology outpatient for kidney biopsy. Pt requests help coordinating all his follow-up care, instructed pt that we will reach out to [facility] today to discuss what is available through his PCP's office.  Objective:  Vital signs in last 24 hours: Vitals:   07/07/19 0030 07/07/19 0136 07/07/19 0152 07/07/19 0605  BP: (!) 155/98 (!) 145/90 (!) 141/89 (!) 144/84  Pulse: (!) 56 (!) 56 (!) 55 (!) 52  Resp: 11 16 18 18   Temp:   97.7 F (36.5 C) 97.7 F (36.5 C)  TempSrc:   Oral Oral  SpO2: 98% 100% 100% 99%  Weight:   120.6 kg   Height:   5\' 11"  (1.803 m)    Physical Exam Vitals and nursing note reviewed.  Constitutional:      General: He is not in acute distress.    Appearance: He is not ill-appearing.     Comments: Pt laying awake in bed, resting comfortably.  Pulmonary:     Effort: Pulmonary effort is normal.  Abdominal:     General: Bowel sounds are normal.     Palpations: Abdomen is soft.     Tenderness: There is no abdominal tenderness.  Musculoskeletal:     Right lower leg: No edema.     Left lower leg: No edema.  Neurological:     Mental Status: He is alert.  Psychiatric:        Mood and Affect: Mood normal.    Assessment/Plan:  Active Problems:   GI bleed  Mr. Sabre Leonetti is a 59yo male with PMH hypertension, CKD Stage IV, mildalcohol usedisorder, and B12 deficiency, who was recently admitted for Fe deficiency anemia secondary to GI bleed and presented to the ED yesterday for low back pain radiating down the back of his legs. He was then found to have a Hgb of 6.4.   Low back pain Lower extremity weakness Pt with 2 day history of pain radiating down the back of his  legs. MRI of L spine shows disc bulge of L3-4 with potential for compression of either/both L3 nerves and disc bulging at L4-5 with potential for irritation of either L4 nerve root, both similar in appearance from study in 2019. - suspect pt having a flare degenerative disc disease secondary to recent prolonged hospitalization - pt states his symptoms have improved since his presentation - recommended conservative management with tylenol, heating pad, and stretching - instructed pt to avoid NSAIDs due to renal disease - can follow-up outpatient with PCP for continued follow-up and potential steroid course vs outpatient PT   GI bleed Fe deficiency anemia Hgb 6.4 on admission, from 7.7 on discharge last week. Capsule endoscopy results are not available for review. 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 He has not yet called [facility] to schedule an outpatient biopsy, but is interested in following up with them outpatient as opposed to consulting them now in the hospital. - printed AVS instructions and follow-up provider list in large font for pt - delivered to the bedside and instructed pt on the office number to call to arrange this follow-up - avoid nephrotoxic meds, reiterated pt to not use NSAIDs or ibuprofen - pt endorses taking sodium bicarb 1300mg  BID since discharge last week  Prior to Admission Living Arrangement: home Anticipated Discharge Location: home Barriers to Discharge: outpatient care coordination with multiple specialty  providers.  Dispo: Anticipated discharge later today.  Ladona Horns, MD 07/07/2019, 6:45 AM Pager: [phone]

## 2019-07-07 NOTE — ED Notes (Signed)
Report given to Alleen Borne, Therapist, sports. All questions answered completely.

## 2019-07-07 NOTE — ED Notes (Signed)
Assumed care of pt. Pt alert, speaking in full sentences. Breathing easy, non-labored. Equal rise and fall of chest noted. Sodium bicarb given per MAR. Name/DOB verified with pt

## 2019-07-07 NOTE — TOC Initial Note (Signed)
Transition of Care [facility]) - Initial/Assessment Note    Patient Details  Name: Daniel [name] MRN: [id number] Date of Birth: 09/12/1960  Transition of [facility]) CM/SW Contact:    Bethena Roys, RN Phone Number: 07/07/2019, 4:19 PM  Clinical Narrative:     Patient presented for [place]. Appointment was scheduled at the [facility]. Patient has transportation and ability to get to pharmacy. Hospital follow up appointment scheduled. Community Case Manager has the patient's number [phone] to call patient. No further needs from Case Manager at this time.               Expected Discharge Plan: Home/Self Care Barriers to Discharge: Barriers Resolved   Patient Goals and CMS Choice Patient states their goals for this hospitalization and ongoing recovery are:: "to return home"   Choice offered to / list presented to : NA  Expected Discharge Plan and Services Expected Discharge Plan: Home/Self Care In-house Referral: NA Discharge Planning Services: CM Consult, Follow-up appt scheduled, [facility], Medication Assistance   Living arrangements for the past 2 months: Single Family Home Expected Discharge Date: 07/07/19               DME Arranged: N/A DME Agency: NA       HH Arranged: NA          Prior Living Arrangements/Services Living arrangements for the past 2 months: Single Family Home Lives with:: Relatives Patient language and need for interpreter reviewed:: Yes Do you feel safe going back to the place where you live?: Yes      Need for Family Participation in Patient Care: No (Comment) Care giver support system in place?: No (comment)   Criminal Activity/Legal Involvement Pertinent to Current Situation/Hospitalization: No - Comment as needed  Activities of Daily Living Home Assistive Devices/Equipment: Cane (specify quad or straight) ADL Screening (condition at time of admission) Patient's cognitive ability adequate to safely  complete daily activities?: Yes Is the patient deaf or have difficulty hearing?: No Does the patient have difficulty seeing, even when wearing glasses/contacts?: No Does the patient have difficulty concentrating, remembering, or making decisions?: No Patient able to express need for assistance with ADLs?: Yes Does the patient have difficulty dressing or bathing?: No Independently performs ADLs?: Yes (appropriate for developmental age) Does the patient have difficulty walking or climbing stairs?: Yes Weakness of Legs: Both Weakness of Arms/Hands: Right  Permission Sought/Granted   Permission granted to share information with : Yes, Verbal Permission Granted              Emotional Assessment Appearance:: Appears stated age Attitude/Demeanor/Rapport: Engaged Affect (typically observed): Accepting Orientation: : Oriented to Self, Oriented to Place, Oriented to  Time, Oriented to Situation Alcohol / Substance Use: Not Applicable Psych Involvement: No (comment)  Admission diagnosis:  GI bleed [K92.2] Acute midline low back pain with bilateral sciatica [M54.42, M54.41] Anemia, unspecified type [D64.9] Patient Active Problem List   Diagnosis Date Noted  . GI bleed 06/28/2019  . Upper GI bleed 06/27/2019  . Iron deficiency anemia 03/30/2019  . CKD (chronic kidney disease) stage 4, GFR 15-29 ml/min (HCC) 03/30/2019  . Alcohol use disorder 03/30/2019  . B12 deficiency 03/30/2019  . Symptomatic anemia 03/29/2019  . Anemia   . Heme positive stool   . Hypertension 06/29/2016  . DM2 (diabetes mellitus, type 2) ([place]) 06/29/2016   PCP:  [name], NP Pharmacy:   [place], [place]  [address] [place] [address] Phone: [phone] Fax: [phone]  [facility] [place] [place] - [address] [place] [address] Phone: [phone] Fax:  [phone]     Social Determinants of Health (SDOH) Interventions    Readmission Risk Interventions Readmission Risk Prevention Plan 07/07/2019  Transportation Screening Complete  PCP or Specialist Appt within 3-5 Days Complete  HRI or [place] Complete  Social Work Consult for [place] Planning/Counseling Complete  Palliative Care Screening Not Applicable  Medication Review Press photographer) Complete  Some recent data might be hidden

## 2019-07-08 ENCOUNTER — Telehealth: Payer: Self-pay

## 2019-07-08 LAB — KAPPA/LAMBDA LIGHT CHAINS
Kappa free light chain: 301.3 mg/L — ABNORMAL HIGH (ref 3.3–19.4)
Kappa, lambda light chain ratio: 1.61 (ref 0.26–1.65)
Lambda free light chains: 187.1 mg/L — ABNORMAL HIGH (ref 5.7–26.3)

## 2019-07-08 NOTE — Telephone Encounter (Signed)
Transition Care Management Follow-up Telephone Call Date of discharge and from where: 07/06/2019, [facility].  Call placed to [phone], DOB and Name verified Pt stated that he cannot take the f /u call at this time because he is at work and does not have the time/ Stated he is aware of upcoming  appointments and will call the clinic when free.   Hospital F /U form  can't be completed at this time.   He has an appointment with Ms Oletta Lamas, NP 3/17/2021at 10:50 am

## 2019-07-11 ENCOUNTER — Encounter: Payer: Self-pay | Admitting: Infectious Diseases

## 2019-07-11 ENCOUNTER — Telehealth: Payer: Self-pay | Admitting: Pharmacy Technician

## 2019-07-11 DIAGNOSIS — B1920 Unspecified viral hepatitis C without hepatic coma: Secondary | ICD-10-CM | POA: Insufficient documentation

## 2019-07-11 DIAGNOSIS — B182 Chronic viral hepatitis C: Secondary | ICD-10-CM | POA: Insufficient documentation

## 2019-07-11 NOTE — Telephone Encounter (Signed)
  [facility] Patient Advocate Encounter ° °Insurance verification completed.   ° °The patient is uninsured and will need patient assistance for medication. ° °We can complete the application and will need to meet with the patient for signatures and income documentation. ° °Pinkie Manger E. Kandie Keiper, CPhT °Specialty Pharmacy Patient Advocate °[facility] °Phone: [phone] °Fax:  [phone] ° ° °

## 2019-07-13 ENCOUNTER — Encounter (INDEPENDENT_AMBULATORY_CARE_PROVIDER_SITE_OTHER): Payer: Self-pay | Admitting: Primary Care

## 2019-07-13 ENCOUNTER — Other Ambulatory Visit: Payer: Self-pay

## 2019-07-13 ENCOUNTER — Ambulatory Visit (INDEPENDENT_AMBULATORY_CARE_PROVIDER_SITE_OTHER): Payer: Self-pay | Admitting: Primary Care

## 2019-07-13 VITALS — BP 134/74 | HR 64 | Temp 97.3°F | Ht 71.0 in | Wt 270.8 lb

## 2019-07-13 DIAGNOSIS — E539 Vitamin B deficiency, unspecified: Secondary | ICD-10-CM

## 2019-07-13 DIAGNOSIS — Z09 Encounter for follow-up examination after completed treatment for conditions other than malignant neoplasm: Secondary | ICD-10-CM

## 2019-07-13 DIAGNOSIS — D500 Iron deficiency anemia secondary to blood loss (chronic): Secondary | ICD-10-CM

## 2019-07-13 DIAGNOSIS — I10 Essential (primary) hypertension: Secondary | ICD-10-CM

## 2019-07-13 MED ORDER — CLONIDINE HCL 0.2 MG PO TABS: ORAL_TABLET | ORAL | 1 refills | Status: DC

## 2019-07-13 MED ORDER — FERROUS SULFATE 325 (65 FE) MG PO TBEC: 325.0000 mg | DELAYED_RELEASE_TABLET | Freq: Two times a day (BID) | ORAL | 1 refills | Status: DC

## 2019-07-13 MED ORDER — CHLORTHALIDONE 50 MG PO TABS: 50.0000 mg | ORAL_TABLET | Freq: Every day | ORAL | 0 refills | Status: DC

## 2019-07-13 MED ORDER — VITAMIN B-12 1000 MCG PO TABS: 1000.0000 ug | ORAL_TABLET | Freq: Every day | ORAL | 2 refills | Status: AC

## 2019-07-13 MED ORDER — AMLODIPINE BESYLATE 10 MG PO TABS: 10.0000 mg | ORAL_TABLET | Freq: Every day | ORAL | 1 refills | Status: DC

## 2019-07-13 NOTE — Progress Notes (Signed)
Established Patient Office Visit  Subjective:  Patient ID: Daniel [name], male    DOB: [birth year]  Age: 59 y.o. MRN: [id number]  CC:  Chief Complaint  Patient presents with  . Hospitalization Follow-up  . Edema  . Dizziness    HPI Daniel [name] is a 59 year old African American male who presents for hospital follow up and bilateral lower extremity edema. Blood pressure is 134/74. Denies shortness of breath, headaches or chest pain but does have lower extremity edema  Past Medical History:  Diagnosis Date  . Diabetes mellitus without complication ([place])   . Hypertension   . ICH (intracerebral hemorrhage) ([place]) 05/20/2017  . Shoulder pain, left 06/28/2013    Past Surgical History:  Procedure Laterality Date  . APPENDECTOMY    . BIOPSY  06/30/2019   Procedure: BIOPSY;  Surgeon: Ronald Lobo, MD;  Location: [facility];  Service: Endoscopy;;  . COLONOSCOPY  01/23/2012   Procedure: COLONOSCOPY;  Surgeon: Danie Binder, MD;  [facility];  Service: Endoscopy;  Laterality: N/A;  11:10 AM  . COLONOSCOPY WITH PROPOFOL N/A 06/30/2019   Procedure: COLONOSCOPY WITH PROPOFOL;  Surgeon: Ronald Lobo, MD;  Location: [facility];  Service: Endoscopy;  Laterality: N/A;  . ESOPHAGOGASTRODUODENOSCOPY (EGD) WITH PROPOFOL N/A 06/30/2019   Procedure: ESOPHAGOGASTRODUODENOSCOPY (EGD) WITH PROPOFOL;  Surgeon: Ronald Lobo, MD;  Location: [facility];  Service: Endoscopy;  Laterality: N/A;  . GIVENS CAPSULE STUDY N/A 06/30/2019   Procedure: GIVENS CAPSULE STUDY;  Surgeon: Ronald Lobo, MD;  Location: [facility];  Service: Endoscopy;  Laterality: N/A;  . INCISION AND DRAINAGE ABSCESS N/A 06/29/2016   Procedure: INCISION AND DRAINAGE ABDOMINAL WALL ABSCESS;  Surgeon: Alphonsa Overall, MD;  Location: [facility];  Service: General;  Laterality: N/A;  . Left heel surgery      Family History  Problem Relation Age of Onset  . Colon cancer Neg Hx     Social History   Socioeconomic  History  . Marital status: Single    Spouse name: Not on file  . Number of children: Not on file  . Years of education: Not on file  . Highest education level: Not on file  Occupational History  . Not on file  Tobacco Use  . Smoking status: Current Every Day Smoker    Packs/day: 1.00    Years: 30.00    Pack years: 30.00    Types: Cigarettes  . Smokeless tobacco: Never Used  Substance and Sexual Activity  . Alcohol use: Yes    Alcohol/week: 12.0 standard drinks    Types: 12 Cans of beer per week  . Drug use: Yes    Types: "Crack" cocaine  . Sexual activity: Yes    Birth control/protection: None  Other Topics Concern  . Not on file  Social History Narrative  . Not on file   Social Determinants of Health   Financial Resource Strain:   . Difficulty of Paying Living Expenses:   Food Insecurity:   . Worried About Charity fundraiser in the Last Year:   . Arboriculturist in the Last Year:   Transportation Needs:   . Film/video editor (Medical):   [name] Lack of Transportation (Non-Medical):   Physical Activity:   . Days of Exercise per Week:   . Minutes of Exercise per Session:   Stress:   . Feeling of Stress :   Social Connections:   . Frequency of Communication with Friends and Family:   .  Frequency of Social Gatherings with Friends and Family:   . Attends Religious Services:   . Active Member of Clubs or Organizations:   . Attends Archivist Meetings:   [name] Marital Status:   Intimate Partner Violence:   . Fear of Current or Ex-Partner:   . Emotionally Abused:   [name] Physically Abused:   . Sexually Abused:     Outpatient Medications Prior to Visit  Medication Sig Dispense Refill  . folic acid (FOLVITE) 1 MG tablet Take 1 tablet (1 mg total) by mouth daily. 30 tablet 0  . amLODipine (NORVASC) 10 MG tablet Take 1 tablet (10 mg total) by mouth daily. 90 tablet 1  . cloNIDine (CATAPRES) 0.2 MG tablet Take  1 tablet twice daily . If makes drowsy take 2 tablets  - after he is off of work (Patient taking differently: Take 0.2 mg by mouth See admin instructions. Take 0.2 mg by mouth two times a day and take after work if drowsiness occurs with this) 180 tablet 1  . ferrous sulfate 325 (65 FE) MG EC tablet Take 1 tablet (325 mg total) by mouth 2 (two) times daily. 60 tablet 0  . vitamin B-12 (CYANOCOBALAMIN) 1000 MCG tablet Take 1 tablet (1,000 mcg total) by mouth daily. 30 tablet 0  . blood glucose meter kit and supplies KIT Dispense based on patient and insurance preference. Use up to four times daily as directed. (FOR ICD-9 250.00, 250.01). 1 each 0  . sodium bicarbonate 650 MG tablet Take 2 tablets (1,300 mg total) by mouth 2 (two) times daily. 120 tablet 0  . gabapentin (NEURONTIN) 300 MG capsule Patient takes 1 tablet twice a day and 2 tablets at night. (Patient not taking: Reported on 07/13/2019) 120 capsule 1   No facility-administered medications prior to visit.    No Known Allergies  ROS Review of Systems  Cardiovascular: Positive for leg swelling.  Neurological: Positive for dizziness.  All other systems reviewed and are negative.     Objective:    Physical Exam  Constitutional: He is oriented to person, place, and time. He appears well-developed.  HENT:  Head: Normocephalic.  Cardiovascular: Normal rate and regular rhythm.  Pulmonary/Chest: Effort normal and breath sounds normal.  Abdominal: Bowel sounds are normal.  Musculoskeletal:        General: Normal range of motion.     Cervical back: Normal range of motion.  Neurological: He is alert and oriented to person, place, and time.  Skin: Skin is warm and dry.  Psychiatric: He has a normal mood and affect. His behavior is normal. Judgment and thought content normal.    BP 134/74 (BP Location: Left Arm, Patient Position: Sitting, Cuff Size: Large)   Pulse 64   Temp (!) 97.3 F (36.3 C) (Temporal)   Ht 5' 11"  (1.803 m)   Wt 270 lb 12.8 oz (122.8 kg)   SpO2 97%   BMI 37.77  kg/m  Wt Readings from Last 3 Encounters:  07/16/19 286 lb 9.6 oz (130 kg)  07/13/19 270 lb 12.8 oz (122.8 kg)  07/07/19 265 lb 14.4 oz (120.6 kg)     Health Maintenance Due  Topic Date Due  . FOOT EXAM  Never done  . OPHTHALMOLOGY EXAM  Never done    There are no preventive care reminders to display for this patient.  Lab Results  Component Value Date   TSH 2.833 06/29/2016   Lab Results  Component Value Date   WBC 5.7 07/16/2019  HGB 6.7 (LL) 07/16/2019   HCT 21.2 (L) 07/16/2019   MCV 111.0 (H) 07/16/2019   PLT 125 (L) 07/16/2019   Lab Results  Component Value Date   NA 140 07/16/2019   K 4.1 07/16/2019   CO2 20 (L) 07/16/2019   GLUCOSE 154 (H) 07/16/2019   BUN 86 (H) 07/16/2019   CREATININE 6.72 (H) 07/16/2019   BILITOT 0.5 07/16/2019   ALKPHOS 60 07/16/2019   AST 159 (H) 07/16/2019   ALT 48 (H) 07/16/2019   PROT 6.6 07/16/2019   ALBUMIN 2.2 (L) 07/16/2019   CALCIUM 8.1 (L) 07/16/2019   ANIONGAP 11 07/16/2019   Lab Results  Component Value Date   CHOL 140 05/21/2017   Lab Results  Component Value Date   HDL 39 (L) 05/21/2017   Lab Results  Component Value Date   LDLCALC 50 05/21/2017   Lab Results  Component Value Date   TRIG 220 (H) 05/22/2017   Lab Results  Component Value Date   CHOLHDL 3.6 05/21/2017   Lab Results  Component Value Date   HGBA1C 5.2 06/23/2019      Assessment & Plan:  Daniel [name] was seen today for hospitalization follow-up, edema and dizziness.  Diagnoses and all orders for this visit:  Essential hypertension Counseled on blood pressure goal of less than 130/80, low-sodium, DASH diet, medication compliance, 150 minutes of moderate intensity exercise per week. Added chlorthalidone 37m daily for edema and lower Blood pressure  -     cloNIDine (CATAPRES) 0.2 MG tablet; Take  1 tablet twice daily . If makes drowsy take 2 tablets - after he is off of work (Patient taking differently: Take 0.2 mg by mouth 2 (two) times  daily. ) -     amLODipine (NORVASC) 10 MG tablet; Take 1 tablet (10 mg total) by mouth daily.  Iron deficiency anemia due to chronic blood loss Increase foods in iron  such as shellfish,liver, organ meats(liver, gizzard), and red meats can increase cholesterol and should be consumed in moderation.However; legumes(beans), spinach, pumpkin seeds, [place] broccoli, tofu, green leafy vegetables and dark chocolate can be consumed without concern to cholesterol.  -     ferrous sulfate 325 (65 FE) MG EC tablet; Take 1 tablet (325 mg total) by mouth 2 (two) times daily.  Vitamin B deficiency -     vitamin B-12 (CYANOCOBALAMIN) 1000 MCG tablet; Take 1 tablet (1,000 mcg total) by mouth daily.  Other orders -     chlorthalidone (HYGROTON) 50 MG tablet; Take 1 tablet (50 mg total) by mouth daily.   Meds ordered this encounter  Medications  . cloNIDine (CATAPRES) 0.2 MG tablet    Sig: Take  1 tablet twice daily . If makes drowsy take 2 tablets - after he is off of work    Dispense:  180 tablet    Refill:  1  . amLODipine (NORVASC) 10 MG tablet    Sig: Take 1 tablet (10 mg total) by mouth daily.    Dispense:  90 tablet    Refill:  1  . ferrous sulfate 325 (65 FE) MG EC tablet    Sig: Take 1 tablet (325 mg total) by mouth 2 (two) times daily.    Dispense:  60 tablet    Refill:  1  . vitamin B-12 (CYANOCOBALAMIN) 1000 MCG tablet    Sig: Take 1 tablet (1,000 mcg total) by mouth daily.    Dispense:  30 tablet    Refill:  2  . chlorthalidone (HYGROTON)  50 MG tablet    Sig: Take 1 tablet (50 mg total) by mouth daily.    Dispense:  90 tablet    Refill:  0    Follow-up: Return in about 1 month (around 08/13/2019) for in person Bilateral edema / medication.    [name], NP

## 2019-07-13 NOTE — Patient Instructions (Signed)
Edema  Edema is when you have too much fluid in your body or under your skin. Edema may make your legs, feet, and ankles swell up. Swelling is also common in looser tissues, like around your eyes. This is a common condition. It gets more common as you get older. There are many possible causes of edema. Eating too much salt (sodium) and being on your feet or sitting for a long time can cause edema in your legs, feet, and ankles. Hot weather may make edema worse. Edema is usually painless. Your skin may look swollen or shiny. Follow these instructions at home:  Keep the swollen body part raised (elevated) above the level of your heart when you are sitting or lying down.  Do not sit still or stand for a long time.  Do not wear tight clothes. Do not wear garters on your upper legs.  Exercise your legs. This can help the swelling go down.  Wear elastic bandages or support stockings as told by your doctor.  Eat a low-salt (low-sodium) diet to reduce fluid as told by your doctor.  Depending on the cause of your swelling, you may need to limit how much fluid you drink (fluid restriction).  Take over-the-counter and prescription medicines only as told by your doctor. Contact a doctor if:  Treatment is not working.  You have heart, liver, or kidney disease and have symptoms of edema.  You have sudden and unexplained weight gain. Get help right away if:  You have shortness of breath or chest pain.  You cannot breathe when you lie down.  You have pain, redness, or warmth in the swollen areas.  You have heart, liver, or kidney disease and get edema all of a sudden.  You have a fever and your symptoms get worse all of a sudden. Summary  Edema is when you have too much fluid in your body or under your skin.  Edema may make your legs, feet, and ankles swell up. Swelling is also common in looser tissues, like around your eyes.  Raise (elevate) the swollen body part above the level of your  heart when you are sitting or lying down.  Follow your doctor's instructions about diet and how much fluid you can drink (fluid restriction). This information is not intended to replace advice given to you by your health care provider. Make sure you discuss any questions you have with your health care provider. Document Revised: 04/17/2017 Document Reviewed: 05/02/2016 Elsevier Patient Education  2020 Elsevier Inc.  

## 2019-07-13 NOTE — Progress Notes (Signed)
Pt has bottle of HCTZ that is not in his current list of medications

## 2019-07-16 ENCOUNTER — Other Ambulatory Visit: Payer: Self-pay

## 2019-07-16 ENCOUNTER — Emergency Department (HOSPITAL_COMMUNITY)
Admission: EM | Admit: 2019-07-16 | Discharge: 2019-07-17 | Disposition: A | Discharge status: Home / Self Care | Payer: Self-pay | Attending: Emergency Medicine | Admitting: Emergency Medicine

## 2019-07-16 ENCOUNTER — Encounter (HOSPITAL_COMMUNITY): Payer: Self-pay | Admitting: Emergency Medicine

## 2019-07-16 DIAGNOSIS — I120 Hypertensive chronic kidney disease with stage 5 chronic kidney disease or end stage renal disease: Secondary | ICD-10-CM | POA: Insufficient documentation

## 2019-07-16 DIAGNOSIS — E1122 Type 2 diabetes mellitus with diabetic chronic kidney disease: Secondary | ICD-10-CM | POA: Insufficient documentation

## 2019-07-16 DIAGNOSIS — K8070 Calculus of gallbladder and bile duct without cholecystitis without obstruction: Secondary | ICD-10-CM | POA: Insufficient documentation

## 2019-07-16 DIAGNOSIS — N185 Chronic kidney disease, stage 5: Secondary | ICD-10-CM | POA: Insufficient documentation

## 2019-07-16 DIAGNOSIS — R748 Abnormal levels of other serum enzymes: Secondary | ICD-10-CM | POA: Insufficient documentation

## 2019-07-16 DIAGNOSIS — K838 Other specified diseases of biliary tract: Secondary | ICD-10-CM

## 2019-07-16 DIAGNOSIS — Z992 Dependence on renal dialysis: Secondary | ICD-10-CM | POA: Insufficient documentation

## 2019-07-16 DIAGNOSIS — Z79899 Other long term (current) drug therapy: Secondary | ICD-10-CM | POA: Insufficient documentation

## 2019-07-16 DIAGNOSIS — D696 Thrombocytopenia, unspecified: Secondary | ICD-10-CM | POA: Insufficient documentation

## 2019-07-16 DIAGNOSIS — F17210 Nicotine dependence, cigarettes, uncomplicated: Secondary | ICD-10-CM | POA: Insufficient documentation

## 2019-07-16 DIAGNOSIS — D539 Nutritional anemia, unspecified: Secondary | ICD-10-CM | POA: Insufficient documentation

## 2019-07-16 DIAGNOSIS — R197 Diarrhea, unspecified: Secondary | ICD-10-CM | POA: Insufficient documentation

## 2019-07-16 DIAGNOSIS — R7401 Elevation of levels of liver transaminase levels: Secondary | ICD-10-CM | POA: Insufficient documentation

## 2019-07-16 MED ORDER — SODIUM CHLORIDE 0.9% FLUSH
3.0000 mL | Freq: Once | INTRAVENOUS | Status: AC
  Administered 2019-07-17: 3 mL via INTRAVENOUS

## 2019-07-16 NOTE — ED Triage Notes (Signed)
Patient reports LLQ pain with diarrhea onset today , denies emesis or fever .

## 2019-07-17 ENCOUNTER — Emergency Department (HOSPITAL_COMMUNITY): Payer: Self-pay

## 2019-07-17 LAB — CBC
HCT: 21.2 % — ABNORMAL LOW (ref 39.0–52.0)
Hemoglobin: 6.7 g/dL — CL (ref 13.0–17.0)
MCH: 35.1 pg — ABNORMAL HIGH (ref 26.0–34.0)
MCHC: 31.6 g/dL (ref 30.0–36.0)
MCV: 111 fL — ABNORMAL HIGH (ref 80.0–100.0)
Platelets: 125 10*3/uL — ABNORMAL LOW (ref 150–400)
RBC: 1.91 MIL/uL — ABNORMAL LOW (ref 4.22–5.81)
RDW: 17.4 % — ABNORMAL HIGH (ref 11.5–15.5)
WBC: 5.7 10*3/uL (ref 4.0–10.5)
nRBC: 0.7 % — ABNORMAL HIGH (ref 0.0–0.2)

## 2019-07-17 LAB — URINALYSIS, ROUTINE W REFLEX MICROSCOPIC
Bilirubin Urine: NEGATIVE
Glucose, UA: NEGATIVE mg/dL
Ketones, ur: NEGATIVE mg/dL
Leukocytes,Ua: NEGATIVE
Nitrite: NEGATIVE
Protein, ur: 100 mg/dL — AB
Specific Gravity, Urine: 1.012 (ref 1.005–1.030)
pH: 5 (ref 5.0–8.0)

## 2019-07-17 LAB — COMPREHENSIVE METABOLIC PANEL
ALT: 48 U/L — ABNORMAL HIGH (ref 0–44)
AST: 159 U/L — ABNORMAL HIGH (ref 15–41)
Albumin: 2.2 g/dL — ABNORMAL LOW (ref 3.5–5.0)
Alkaline Phosphatase: 60 U/L (ref 38–126)
Anion gap: 11 (ref 5–15)
BUN: 86 mg/dL — ABNORMAL HIGH (ref 6–20)
CO2: 20 mmol/L — ABNORMAL LOW (ref 22–32)
Calcium: 8.1 mg/dL — ABNORMAL LOW (ref 8.9–10.3)
Chloride: 109 mmol/L (ref 98–111)
Creatinine, Ser: 6.72 mg/dL — ABNORMAL HIGH (ref 0.61–1.24)
GFR calc Af Amer: 9 mL/min — ABNORMAL LOW (ref >60)
GFR calc non Af Amer: 8 mL/min — ABNORMAL LOW (ref >60)
Glucose, Bld: 154 mg/dL — ABNORMAL HIGH (ref 70–99)
Potassium: 4.1 mmol/L (ref 3.5–5.1)
Sodium: 140 mmol/L (ref 135–145)
Total Bilirubin: 0.5 mg/dL (ref 0.3–1.2)
Total Protein: 6.6 g/dL (ref 6.5–8.1)

## 2019-07-17 LAB — LIPASE, BLOOD: Lipase: 84 U/L — ABNORMAL HIGH (ref 11–51)

## 2019-07-17 MED ORDER — SODIUM CHLORIDE 0.9 % IV BOLUS
1000.0000 mL | Freq: Once | INTRAVENOUS | Status: AC
  Administered 2019-07-17: 02:00:00 1000 mL via INTRAVENOUS

## 2019-07-17 MED ORDER — LOPERAMIDE HCL 2 MG PO CAPS
4.0000 mg | ORAL_CAPSULE | Freq: Once | ORAL | Status: AC
  Administered 2019-07-17: 02:00:00 4 mg via ORAL
  Filled 2019-07-17: qty 2

## 2019-07-17 NOTE — ED Provider Notes (Signed)
[facility] EMERGENCY DEPARTMENT Provider Note   CSN: [id number] Arrival date & time: 07/16/19  2301   History Chief Complaint  Patient presents with   Abdominal Pain    Daniel [name] is a 59 y.o. male.  The history is provided by the patient.  Abdominal Pain He has history of hypertension, diabetes, chronic kidney disease and comes in because of diarrhea which started this morning.  He states he has had 6 or 7 episodes of diarrhea.  He denies any blood in the stool.  Denies any nausea or vomiting.  He is complaining of some periumbilical pain which he rates at 8/10.  This pain does not radiate.  He has not done anything to treat his diarrhea.  He denies any sick contacts and denies any unusual food intake.  Past Medical History:  Diagnosis Date   Diabetes mellitus without complication (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) 03/30/2019   Alcohol use disorder 03/30/2019   B12 deficiency 03/30/2019   Symptomatic anemia 03/29/2019   Anemia    Heme positive stool    Hypertension 06/29/2016   DM2 (diabetes mellitus, type 2) ([place]) 06/29/2016    Past Surgical History:  Procedure Laterality Date   APPENDECTOMY     BIOPSY  06/30/2019   Procedure: BIOPSY;  Surgeon: Ronald Lobo, MD;  Location: [place];  Service: Endoscopy;;   COLONOSCOPY  01/23/2012   Procedure: COLONOSCOPY;  Surgeon: Danie Binder, MD;  [facility];  Service: Endoscopy;  Laterality: N/A;  11:10 AM   COLONOSCOPY WITH PROPOFOL N/A 06/30/2019   Procedure: COLONOSCOPY WITH PROPOFOL;  Surgeon: Ronald Lobo, MD;  Location: [place];  Service: Endoscopy;  Laterality: N/A;   ESOPHAGOGASTRODUODENOSCOPY (EGD) WITH PROPOFOL N/A 06/30/2019   Procedure: ESOPHAGOGASTRODUODENOSCOPY (EGD) WITH PROPOFOL;  Surgeon: Ronald Lobo, MD;  Location: [place];  Service: Endoscopy;  Laterality: N/A;   GIVENS CAPSULE STUDY N/A 06/30/2019   Procedure: GIVENS CAPSULE STUDY;  Surgeon: Ronald Lobo, MD;  Location: [place];  Service: Endoscopy;  Laterality: N/A;   INCISION AND DRAINAGE ABSCESS N/A 06/29/2016   Procedure: INCISION AND DRAINAGE ABDOMINAL WALL ABSCESS;  Surgeon: Alphonsa Overall, MD;  Location: [facility];  Service: General;  Laterality: N/A;   Left heel surgery         Family History  Problem Relation Age of Onset   Colon cancer Neg Hx     Social History   Tobacco Use   Smoking status: Current Every Day Smoker    Packs/day: 1.00    Years: 30.00    Pack years: 30.00    Types: Cigarettes   Smokeless tobacco: Never Used  Substance Use Topics   Alcohol use: Yes    Alcohol/week: 12.0 standard drinks    Types: 12 Cans of beer per week   Drug use: Yes    Types: "Crack" cocaine    Home Medications Prior to Admission medications   Medication Sig Start Date End Date Taking? Authorizing Provider  amLODipine (NORVASC) 10 MG tablet Take 1 tablet (10 mg total) by mouth daily. 07/13/19 07/12/20  [name], NP  blood  glucose meter kit and supplies KIT Dispense based on patient and insurance preference. Use up to four times daily as directed. (FOR ICD-9 250.00, 250.01). 07/01/16   Nita Sells, MD  chlorthalidone (HYGROTON) 50 MG tablet Take 1 tablet (50 mg total) by mouth daily. 07/13/19   [name], NP  cloNIDine (CATAPRES) 0.2 MG tablet Take  1 tablet twice daily . If makes drowsy take 2 tablets - after he is off of work 07/13/19   [name], NP  ferrous sulfate 325 (65 FE) MG EC tablet Take 1 tablet (325 mg total) by mouth 2 (two) times daily. 07/13/19 08/12/19  [name], NP  folic acid (FOLVITE) 1 MG tablet Take 1 tablet (1 mg total) by mouth daily. 01/25/19   Kathrene Alu, MD  sodium bicarbonate 650 MG tablet Take 2 tablets (1,300 mg total) by mouth 2 (two) times daily. 07/01/19   Ladona Horns, MD  vitamin B-12 (CYANOCOBALAMIN) 1000 MCG tablet Take 1 tablet (1,000 mcg total) by mouth daily. 07/13/19 08/12/19  [name], NP    Allergies    Patient has no known allergies.  Review of Systems   Review of Systems  Gastrointestinal: Positive for abdominal pain.  All other systems reviewed and are negative.   Physical Exam Updated Vital Signs BP 125/65 (BP Location: Right Arm)    Pulse 85    Temp 98.2 F (36.8 C) (Oral)    Resp 20    Ht 5' 11"  (1.803 m)    Wt 130 kg    SpO2 100%    BMI 39.97 kg/m   Physical Exam Vitals and nursing note reviewed.   59 year old male, resting comfortably and in no acute distress. Vital signs are normal. Oxygen saturation is 100%, which is normal. Head is normocephalic and atraumatic. PERRLA, EOMI. Oropharynx is clear. Neck is nontender and supple without adenopathy or JVD. Back is nontender and there is no CVA tenderness. Lungs are clear without rales, wheezes, or rhonchi. Chest is nontender. Heart has regular rate and rhythm without murmur. Abdomen is soft, flat, with very mild periumbilical tenderness.  There is no rebound or guarding.  There are no masses or hepatosplenomegaly and peristalsis is hypoactive. Extremities have no cyanosis or edema, full range of motion is present. Skin is warm and dry without rash. Neurologic: Mental status is normal, cranial nerves are intact, there are no motor or sensory deficits.  ED Results / Procedures / Treatments   Labs (all labs ordered are listed, but only abnormal results are displayed) Labs Reviewed  LIPASE, BLOOD - Abnormal; Notable for the following components:      Result Value   Lipase 84 (*)    All other components within normal limits    COMPREHENSIVE METABOLIC PANEL - Abnormal; Notable for the following components:   CO2 20 (*)    Glucose, Bld 154 (*)    BUN 86 (*)    Creatinine, Ser 6.72 (*)    Calcium 8.1 (*)    Albumin 2.2 (*)    AST 159 (*)    ALT 48 (*)    GFR calc non Af Amer 8 (*)    GFR calc Af Amer 9 (*)    All other components within normal limits  CBC - Abnormal; Notable for the following components:   RBC 1.91 (*)    Hemoglobin 6.7 (*)    HCT 21.2 (*)    MCV 111.0 (*)    MCH 35.1 (*)  RDW 17.4 (*)    Platelets 125 (*)    nRBC 0.7 (*)    All other components within normal limits  URINALYSIS, ROUTINE W REFLEX MICROSCOPIC - Abnormal; Notable for the following components:   Hgb urine dipstick SMALL (*)    Protein, ur 100 (*)    Bacteria, UA RARE (*)    All other components within normal limits   Radiology CT ABDOMEN PELVIS WO CONTRAST  Result Date: 07/17/2019 CLINICAL DATA:  Diverticulitis suspected, left lower quadrant with diarrheal onset today mom EXAM: CT ABDOMEN AND PELVIS WITHOUT CONTRAST TECHNIQUE: Multidetector CT imaging of the abdomen and pelvis was performed following the standard protocol without IV contrast. COMPARISON:  CT 06/29/2016 FINDINGS: Lower chest: Bibasilar atelectatic changes in the lung bases. More patchy consolidative opacities noted in the posterior left lower lobe. No effusion. Hepatobiliary: No focal liver lesion is seen. Smooth liver surface contour. Normal hepatic attenuation. Mild gallbladder wall thickening and pericholecystic hazy stranding. 7 mm calculus seen at the level of the cystic duct. No other visible biliary calcifications. No abnormal dilatation of the intra or extrahepatic common bile duct or remaining portions of the biliary tree. Pancreas: Unremarkable. No pancreatic ductal dilatation or surrounding inflammatory changes. Spleen: Normal in size without focal abnormality. Adrenals/Urinary Tract: Normal adrenal glands. Punctate nonobstructing calculus seen in the  lower pole left kidney. No obstructive urolithiasis or hydronephrosis. No suspicious renal lesions. Mild asymmetric anterior bladder wall thickening, nonspecific. No bladder debris or calculi. Stomach/Bowel: Distal esophagus is unremarkable. Stomach mildly distended with ingested material. Duodenum is unremarkable. No small bowel dilatation or wall thickening. The appendix is surgically absent. Few scattered colonic diverticula including a small cecal diverticulum (3/65). No focal diverticular inflammation to suggest findings of acute diverticulitis. No colonic dilatation or wall thickening. Vascular/Lymphatic: Atherosclerotic plaque within the normal caliber aorta. No suspicious or enlarged lymph nodes in the included lymphatic chains. Reproductive: The prostate and seminal vesicles are unremarkable. Other: No abdominopelvic free fluid or free gas. No bowel containing hernias. Musculoskeletal: Multilevel degenerative changes are present in the imaged portions of the spine. No acute osseous abnormality or suspicious osseous lesion. Stable anterior wedging at T12. IMPRESSION: 1. Mild gallbladder wall thickening and pericholecystic hazy stranding with 7 mm calculus seen at the level of the cystic duct. No other visible biliary calcifications. Findings are suggestive of acute cholecystitis. 2. Punctate nonobstructing calculus in the lower pole left kidney. No obstructive urolithiasis or hydronephrosis. 3. Mild asymmetric anterior bladder wall thickening, nonspecific. Correlate with urinalysis to exclude infection. May consider cystoscopy if risk factors are present. 4. Few scattered colonic diverticula without evidence of acute diverticulitis. 5. Stable anterior wedging at T12. 6. Aortic Atherosclerosis (ICD10-I70.0). Electronically Signed   By: Lovena Le M.D.   On: 07/17/2019 02:37   US Abdomen Limited  Result Date: 07/17/2019 CLINICAL DATA:  Initial evaluation for possible acute cholecystitis. EXAM: ULTRASOUND  ABDOMEN LIMITED RIGHT UPPER QUADRANT COMPARISON:  Prior CT from earlier the same day. FINDINGS: Gallbladder: Gallbladder somewhat contracted. 7 mm echogenic focus within the gallbladder lumen at the level of the fundus likely reflects a small stone, also seen on prior CT. Gallbladder wall thickened up to 4.4 mm. Suspected superimposed mild adenomyomatosis. Trace free pericholecystic fluid. No sonographic Murphy sign elicited on exam. Common bile duct: Diameter: Dilated up to 8.7 mm distally. No visible choledocholithiasis. Liver: No focal lesion identified. Within normal limits in parenchymal echogenicity. Portal vein is patent on color Doppler imaging with normal direction of blood flow towards  the liver. Other: None. IMPRESSION: 1. Probable cholelithiasis with associated gallbladder wall thickening and trace free pericholecystic fluid, correlating with findings seen on prior CT. Clinical correlation for possible acute cholecystitis recommended. Further assessment with dedicated hepatobiliary scintigraphy could be performed for further evaluation as warranted. 2. Dilatation of the common bile duct up to 8.7 mm. No visible choledocholithiasis. Electronically Signed   By: Jeannine Boga M.D.   On: 07/17/2019 05:38    Procedures Procedures  Medications Ordered in ED Medications  sodium chloride flush (NS) 0.9 % injection 3 mL (3 mLs Intravenous Given 07/17/19 0136)  loperamide (IMODIUM) capsule 4 mg (4 mg Oral Given 07/17/19 0153)  sodium chloride 0.9 % bolus 1,000 mL (0 mLs Intravenous Stopped 07/17/19 0338)    ED Course  I have reviewed the triage vital signs and the nursing notes.  Pertinent labs & imaging results that were available during my care of the patient were reviewed by me and considered in my medical decision making (see chart for details).  MDM Rules/Calculators/A&P Diarrhea, probably infectious.  He had another episode of diarrhea after arrival in the ED, and is given a dose of  loperamide.  Labs show renal insufficiency with creatinine stable, mild elevation of ALT and AST which had not been present previously, mild elevation of lipase.  Clinically, he does not have pancreatitis, but will send for CT of abdomen and pelvis.  Also, hemoglobin has dropped from 7.6 on 3/11 following blood transfusion down to 6.7 today.  6.7 is in the range that he has been running recently.  Thrombocytopenia is present and is unchanged from prior.  Given multiple episodes of diarrhea today, will give IV fluids and a dose of loperamide.  He feels much better after above-noted treatment.  CT showed cholelithiasis and possible cholecystitis.  He was sent for gallbladder ultrasound which confirmed cholelithiasis but no findings that were strongly suggestive of acute cholecystitis.  His clinical picture is not that of acute cholecystitis.  There was incidental finding of mildly dilated common bile duct without evidence of choledocholithiasis.  He is discharged with instructions to take loperamide as needed for his diarrhea.  Advised that he needs to have his CBC rechecked to make sure hemoglobin is not dropping.  He needs to see his nephrologist as I feel he is likely to need dialysis soon.  He is referred to general surgery for evaluation of cholelithiasis.  Return precautions discussed.  Final Clinical Impression(s) / ED Diagnoses Final diagnoses:  Diarrhea of presumed infectious origin  Elevated lipase  Elevated transaminase level  Stage 5 chronic kidney disease not on chronic dialysis (HCC)  Macrocytic anemia  Thrombocytopenia (HCC)  Calculus of gallbladder and bile duct without cholecystitis or obstruction  Dilation of common bile duct    Rx / DC Orders ED Discharge Orders    None       Delora Fuel, MD 50/72/25 0602

## 2019-07-17 NOTE — Discharge Instructions (Addendum)
Take loperamide (Imodium AD) as needed for diarrhea.  Please see your primary care provider in 2-3 days to repeat your blood count.  Return if your symptoms are getting worse.  Please see your nephrologist (kidney doctor) to discuss your kidneys - they are not working well and you may need to start dialysis soon.

## 2019-07-21 ENCOUNTER — Telehealth: Payer: Self-pay

## 2019-07-21 ENCOUNTER — Ambulatory Visit (INDEPENDENT_AMBULATORY_CARE_PROVIDER_SITE_OTHER): Payer: Self-pay | Admitting: Primary Care

## 2019-07-21 ENCOUNTER — Telehealth (INDEPENDENT_AMBULATORY_CARE_PROVIDER_SITE_OTHER): Payer: Self-pay

## 2019-07-21 NOTE — Telephone Encounter (Signed)
Patient called requesting to speak with PCP in regards to his health. PCP walked in front office and over heard conversation and advice patient to go to the emergency room since patient stated he was out of air and did not sound to good.

## 2019-07-21 NOTE — Telephone Encounter (Signed)
Walked in on conversation where patient wanting to speak to me gasping for air and not feeling well. Told Mr.Shipley it was me Sharyn Lull l needed you to go to E emergency room to be evaluated and follow up with me patient voiced understanding and agreed

## 2019-07-21 NOTE — Telephone Encounter (Signed)
Call placed to the patient to inquire about the status of his medicaid application. Informed him that he is being contacted at the request of Ms Oletta Lamas, NP who he saw last week.  He then said that he hasn't seen Ms edwards, NP and he didn't see anyone last week.   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  He is currently living with his cousin and wants to get out of his house because it is difficult to navigate with the stairs .   Informed him that he will need to contact [place] to check on the status of the medicaid application [phone]. He said that he would need to call this CM back for the information because he could not write it down at this time and then hung up.

## 2019-07-23 ENCOUNTER — Observation Stay (HOSPITAL_COMMUNITY): Payer: Self-pay

## 2019-07-23 ENCOUNTER — Encounter (HOSPITAL_COMMUNITY): Payer: Self-pay | Admitting: *Deleted

## 2019-07-23 ENCOUNTER — Inpatient Hospital Stay (HOSPITAL_COMMUNITY)
Admission: EM | Admit: 2019-07-23 | Discharge: 2019-07-25 | DRG: 683 | Disposition: A | Discharge status: Home / Self Care | Payer: Self-pay | Attending: Student in an Organized Health Care Education/Training Program | Admitting: Student in an Organized Health Care Education/Training Program

## 2019-07-23 ENCOUNTER — Emergency Department (HOSPITAL_COMMUNITY): Payer: Self-pay

## 2019-07-23 ENCOUNTER — Other Ambulatory Visit: Payer: Self-pay

## 2019-07-23 DIAGNOSIS — F1010 Alcohol abuse, uncomplicated: Secondary | ICD-10-CM | POA: Diagnosis present

## 2019-07-23 DIAGNOSIS — F17210 Nicotine dependence, cigarettes, uncomplicated: Secondary | ICD-10-CM | POA: Diagnosis present

## 2019-07-23 DIAGNOSIS — D631 Anemia in chronic kidney disease: Secondary | ICD-10-CM | POA: Diagnosis present

## 2019-07-23 DIAGNOSIS — Z20822 Contact with and (suspected) exposure to covid-19: Secondary | ICD-10-CM | POA: Diagnosis present

## 2019-07-23 DIAGNOSIS — D649 Anemia, unspecified: Secondary | ICD-10-CM | POA: Diagnosis present

## 2019-07-23 DIAGNOSIS — E538 Deficiency of other specified B group vitamins: Secondary | ICD-10-CM | POA: Diagnosis present

## 2019-07-23 DIAGNOSIS — Z9114 Patient's other noncompliance with medication regimen: Secondary | ICD-10-CM

## 2019-07-23 DIAGNOSIS — I951 Orthostatic hypotension: Secondary | ICD-10-CM

## 2019-07-23 DIAGNOSIS — K2980 Duodenitis without bleeding: Secondary | ICD-10-CM

## 2019-07-23 DIAGNOSIS — E119 Type 2 diabetes mellitus without complications: Secondary | ICD-10-CM

## 2019-07-23 DIAGNOSIS — D696 Thrombocytopenia, unspecified: Secondary | ICD-10-CM | POA: Diagnosis present

## 2019-07-23 DIAGNOSIS — Z8639 Personal history of other endocrine, nutritional and metabolic disease: Secondary | ICD-10-CM

## 2019-07-23 DIAGNOSIS — D539 Nutritional anemia, unspecified: Secondary | ICD-10-CM | POA: Diagnosis present

## 2019-07-23 DIAGNOSIS — Z7289 Other problems related to lifestyle: Secondary | ICD-10-CM

## 2019-07-23 DIAGNOSIS — N185 Chronic kidney disease, stage 5: Secondary | ICD-10-CM | POA: Diagnosis present

## 2019-07-23 DIAGNOSIS — E8770 Fluid overload, unspecified: Secondary | ICD-10-CM | POA: Diagnosis present

## 2019-07-23 DIAGNOSIS — R531 Weakness: Secondary | ICD-10-CM

## 2019-07-23 DIAGNOSIS — E1122 Type 2 diabetes mellitus with diabetic chronic kidney disease: Secondary | ICD-10-CM | POA: Diagnosis present

## 2019-07-23 DIAGNOSIS — I120 Hypertensive chronic kidney disease with stage 5 chronic kidney disease or end stage renal disease: Principal | ICD-10-CM | POA: Diagnosis present

## 2019-07-23 DIAGNOSIS — D72819 Decreased white blood cell count, unspecified: Secondary | ICD-10-CM

## 2019-07-23 DIAGNOSIS — Z9889 Other specified postprocedural states: Secondary | ICD-10-CM

## 2019-07-23 DIAGNOSIS — R278 Other lack of coordination: Secondary | ICD-10-CM

## 2019-07-23 DIAGNOSIS — Z79899 Other long term (current) drug therapy: Secondary | ICD-10-CM

## 2019-07-23 DIAGNOSIS — Z720 Tobacco use: Secondary | ICD-10-CM

## 2019-07-23 LAB — CBC WITH DIFFERENTIAL/PLATELET
Abs Immature Granulocytes: 0.08 10*3/uL — ABNORMAL HIGH (ref 0.00–0.07)
Basophils Absolute: 0 10*3/uL (ref 0.0–0.1)
Basophils Relative: 0 %
Eosinophils Absolute: 0.1 10*3/uL (ref 0.0–0.5)
Eosinophils Relative: 1 %
HCT: 18.6 % — ABNORMAL LOW (ref 39.0–52.0)
Hemoglobin: 5.9 g/dL — CL (ref 13.0–17.0)
Immature Granulocytes: 2 %
Lymphocytes Relative: 43 %
Lymphs Abs: 1.6 10*3/uL (ref 0.7–4.0)
MCH: 35.5 pg — ABNORMAL HIGH (ref 26.0–34.0)
MCHC: 31.7 g/dL (ref 30.0–36.0)
MCV: 112 fL — ABNORMAL HIGH (ref 80.0–100.0)
Monocytes Absolute: 0.5 10*3/uL (ref 0.1–1.0)
Monocytes Relative: 12 %
Neutro Abs: 1.6 10*3/uL — ABNORMAL LOW (ref 1.7–7.7)
Neutrophils Relative %: 42 %
Platelets: 123 10*3/uL — ABNORMAL LOW (ref 150–400)
RBC: 1.66 MIL/uL — ABNORMAL LOW (ref 4.22–5.81)
RDW: 19 % — ABNORMAL HIGH (ref 11.5–15.5)
WBC: 3.8 10*3/uL — ABNORMAL LOW (ref 4.0–10.5)
nRBC: 1.9 % — ABNORMAL HIGH (ref 0.0–0.2)

## 2019-07-23 LAB — COMPREHENSIVE METABOLIC PANEL
ALT: 38 U/L (ref 0–44)
AST: 98 U/L — ABNORMAL HIGH (ref 15–41)
Albumin: 2.2 g/dL — ABNORMAL LOW (ref 3.5–5.0)
Alkaline Phosphatase: 50 U/L (ref 38–126)
Anion gap: 11 (ref 5–15)
BUN: 78 mg/dL — ABNORMAL HIGH (ref 6–20)
CO2: 18 mmol/L — ABNORMAL LOW (ref 22–32)
Calcium: 7.7 mg/dL — ABNORMAL LOW (ref 8.9–10.3)
Chloride: 111 mmol/L (ref 98–111)
Creatinine, Ser: 7.84 mg/dL — ABNORMAL HIGH (ref 0.61–1.24)
GFR calc Af Amer: 8 mL/min — ABNORMAL LOW (ref >60)
GFR calc non Af Amer: 7 mL/min — ABNORMAL LOW (ref >60)
Glucose, Bld: 105 mg/dL — ABNORMAL HIGH (ref 70–99)
Potassium: 3.6 mmol/L (ref 3.5–5.1)
Sodium: 140 mmol/L (ref 135–145)
Total Bilirubin: 0.7 mg/dL (ref 0.3–1.2)
Total Protein: 6.5 g/dL (ref 6.5–8.1)

## 2019-07-23 LAB — PREPARE RBC (CROSSMATCH)

## 2019-07-23 LAB — PROTIME-INR
INR: 1 (ref 0.8–1.2)
Prothrombin Time: 13 seconds (ref 11.4–15.2)

## 2019-07-23 LAB — CBG MONITORING, ED: Glucose-Capillary: 101 mg/dL — ABNORMAL HIGH (ref 70–99)

## 2019-07-23 LAB — GLUCOSE, CAPILLARY: Glucose-Capillary: 183 mg/dL — ABNORMAL HIGH (ref 70–99)

## 2019-07-23 LAB — SARS CORONAVIRUS 2 (TAT 6-24 HRS): SARS Coronavirus 2: NEGATIVE

## 2019-07-23 LAB — APTT: aPTT: 27 seconds (ref 24–36)

## 2019-07-23 LAB — VITAMIN B12: Vitamin B-12: 699 pg/mL (ref 180–914)

## 2019-07-23 LAB — ETHANOL: Alcohol, Ethyl (B): <10 mg/dL (ref <10)

## 2019-07-23 LAB — MAGNESIUM: Magnesium: 1.9 mg/dL (ref 1.7–2.4)

## 2019-07-23 LAB — SAVE SMEAR(SSMR), FOR PROVIDER SLIDE REVIEW

## 2019-07-23 MED ORDER — SODIUM CHLORIDE 0.9% IV SOLUTION: Freq: Once | INTRAVENOUS | Status: DC

## 2019-07-23 MED ORDER — SODIUM CHLORIDE 0.9 % IV BOLUS
500.0000 mL | Freq: Once | INTRAVENOUS | Status: AC
  Administered 2019-07-23: 16:00:00 500 mL via INTRAVENOUS

## 2019-07-23 MED ORDER — ACETAMINOPHEN 650 MG RE SUPP: 650.0000 mg | Freq: Four times a day (QID) | RECTAL | Status: DC | PRN

## 2019-07-23 MED ORDER — ACETAMINOPHEN 325 MG PO TABS: 650.0000 mg | ORAL_TABLET | Freq: Four times a day (QID) | ORAL | Status: DC | PRN

## 2019-07-23 MED ORDER — PANTOPRAZOLE SODIUM 40 MG PO TBEC
40.0000 mg | DELAYED_RELEASE_TABLET | Freq: Every day | ORAL | Status: DC
  Administered 2019-07-23 – 2019-07-25 (×3): 40 mg via ORAL
  Filled 2019-07-23 (×3): qty 1

## 2019-07-23 MED ORDER — SENNOSIDES-DOCUSATE SODIUM 8.6-50 MG PO TABS: 1.0000 | ORAL_TABLET | Freq: Every evening | ORAL | Status: DC | PRN

## 2019-07-23 MED ORDER — FUROSEMIDE 10 MG/ML IJ SOLN
60.0000 mg | Freq: Once | INTRAMUSCULAR | Status: AC
  Administered 2019-07-23: 60 mg via INTRAVENOUS
  Filled 2019-07-23: qty 6

## 2019-07-23 MED ORDER — SUCRALFATE 1 GM/10ML PO SUSP
1.0000 g | Freq: Three times a day (TID) | ORAL | Status: DC
  Administered 2019-07-23 – 2019-07-25 (×6): 1 g via ORAL
  Filled 2019-07-23 (×8): qty 10

## 2019-07-23 NOTE — ED Provider Notes (Signed)
  Physical Exam  BP 126/74 (BP Location: Right Arm)   Pulse 72   Temp 98.1 F (36.7 C) (Oral)   Resp 14   Ht 5\' 11"  (1.803 m)   Wt 122.5 kg   SpO2 98%   BMI 37.66 kg/m   Physical Exam  ED Course/Procedures     Procedures  MDM   Assuming care of patient from Dr. Ralene Bathe.   Patient in the ED for syncope. Pt was ambulating, got dizzy and fainted. Workup thus far shows normal ekg and CXR.  Concerning findings are as following : none Important pending results are : all labs.  According to Dr. Ralene Bathe, plan is to d/c patient if the labs are reassuring.   Patient had no complains, no concerns from the nursing side. Will continue to monitor.     Varney Biles, MD 07/23/19 [phone]

## 2019-07-23 NOTE — ED Provider Notes (Signed)
[facility] EMERGENCY DEPARTMENT Provider Note   CSN: [id number] Arrival date & time: 07/23/19  1348     History Chief Complaint  Patient presents with  . Loss of Consciousness    Daniel [name] is a 59 y.o. male.  The history is provided by the patient and medical records. No language interpreter was used.  Loss of Consciousness  Daniel [name] is a 59 y.o. male who presents to the Emergency Department complaining of syncope. He presents the emergency department for evaluation following a syncopal event. Per EMS report he was walking outside of [place] when he passed out. Patient states that he did not pass out but he became lightheaded and fell. He states that he did drink 124 ounce beer today. He denies any recent illnesses. He denies any headache, chest pain, shortness of breath, nausea, vomiting, diarrhea, blocker blood he stools. He has no complaints on ED evaluation. He states that he did hit his right hand but is not very sore anymore. He did not hit his head.    Past Medical History:  Diagnosis Date  . Diabetes mellitus without complication ([place])   . Hypertension   . ICH (intracerebral hemorrhage) ([place]) 05/20/2017  . Shoulder pain, left 06/28/2013    Patient Active Problem List   Diagnosis Date Noted  . Chronic hepatitis C without hepatic coma ([place]) 07/11/2019  . GI bleed 06/28/2019  . Upper GI bleed 06/27/2019  . Iron deficiency anemia 03/30/2019  . CKD (chronic kidney disease) stage 4, GFR 15-29 ml/min ([facility]) 03/30/2019  . Alcohol use disorder 03/30/2019  . B12 deficiency 03/30/2019  . Symptomatic anemia 03/29/2019  . Anemia   . Heme positive stool   . Hypertension 06/29/2016  . DM2 (diabetes mellitus, type 2) ([place]) 06/29/2016    Past Surgical History:  Procedure Laterality Date  . APPENDECTOMY    . BIOPSY  06/30/2019   Procedure: BIOPSY;  Surgeon: Ronald Lobo, MD;  Location: [facility];  Service: Endoscopy;;  . COLONOSCOPY   01/23/2012   Procedure: COLONOSCOPY;  Surgeon: Danie Binder, MD;  [facility];  Service: Endoscopy;  Laterality: N/A;  11:10 AM  . COLONOSCOPY WITH PROPOFOL N/A 06/30/2019   Procedure: COLONOSCOPY WITH PROPOFOL;  Surgeon: Ronald Lobo, MD;  Location: [facility];  Service: Endoscopy;  Laterality: N/A;  . ESOPHAGOGASTRODUODENOSCOPY (EGD) WITH PROPOFOL N/A 06/30/2019   Procedure: ESOPHAGOGASTRODUODENOSCOPY (EGD) WITH PROPOFOL;  Surgeon: Ronald Lobo, MD;  Location: [facility];  Service: Endoscopy;  Laterality: N/A;  . GIVENS CAPSULE STUDY N/A 06/30/2019   Procedure: GIVENS CAPSULE STUDY;  Surgeon: Ronald Lobo, MD;  Location: [facility];  Service: Endoscopy;  Laterality: N/A;  . INCISION AND DRAINAGE ABSCESS N/A 06/29/2016   Procedure: INCISION AND DRAINAGE ABDOMINAL WALL ABSCESS;  Surgeon: Alphonsa Overall, MD;  Location: [facility];  Service: General;  Laterality: N/A;  . Left heel surgery         Family History  Problem Relation Age of Onset  . Colon cancer Neg Hx     Social History   Tobacco Use  . Smoking status: Current Every Day Smoker    Packs/day: 1.00    Years: 30.00    Pack years: 30.00    Types: Cigarettes  . Smokeless tobacco: Never Used  Substance Use Topics  . Alcohol use: Yes    Alcohol/week: 12.0 standard drinks    Types: 12 Cans of beer per week  . Drug use: Yes    Types: "Crack" cocaine  Home Medications Prior to Admission medications   Medication Sig Start Date End Date Taking? Authorizing Provider  amLODipine (NORVASC) 10 MG tablet Take 1 tablet (10 mg total) by mouth daily. 07/13/19 07/12/20  [name], NP  blood glucose meter kit and supplies KIT Dispense based on patient and insurance preference. Use up to four times daily as directed. (FOR ICD-9 250.00, 250.01). 07/01/16   Nita Sells, MD  chlorthalidone (HYGROTON) 50 MG tablet Take 1 tablet (50 mg total) by mouth daily. 07/13/19   [name], NP  cloNIDine  (CATAPRES) 0.2 MG tablet Take  1 tablet twice daily . If makes drowsy take 2 tablets - after he is off of work Patient taking differently: Take 0.2 mg by mouth 2 (two) times daily.  07/13/19   [name], NP  ferrous sulfate 325 (65 FE) MG EC tablet Take 1 tablet (325 mg total) by mouth 2 (two) times daily. 07/13/19 08/12/19  [name], NP  folic acid (FOLVITE) 1 MG tablet Take 1 tablet (1 mg total) by mouth daily. 01/25/19   Kathrene Alu, MD  hydrochlorothiazide (HYDRODIURIL) 25 MG tablet Take 25 mg by mouth daily. 07/09/19   [provider]  sodium bicarbonate 650 MG tablet Take 2 tablets (1,300 mg total) by mouth 2 (two) times daily. 07/01/19   Ladona Horns, MD  vitamin B-12 (CYANOCOBALAMIN) 1000 MCG tablet Take 1 tablet (1,000 mcg total) by mouth daily. 07/13/19 08/12/19  [name], NP    Allergies    Patient has no known allergies.  Review of Systems   Review of Systems  Cardiovascular: Positive for syncope.  All other systems reviewed and are negative.   Physical Exam Updated Vital Signs BP 137/82   Pulse 65   Temp 98.1 F (36.7 C) (Oral)   Resp 11   Ht 5' 11"  (1.803 m)   Wt 122.5 kg   SpO2 100%   BMI 37.66 kg/m   Physical Exam Vitals and nursing note reviewed.  Constitutional:      Appearance: He is well-developed.  HENT:     Head: Normocephalic and atraumatic.  Cardiovascular:     Rate and Rhythm: Normal rate and regular rhythm.     Heart sounds: No murmur.  Pulmonary:     Effort: Pulmonary effort is normal. No respiratory distress.     Breath sounds: Normal breath sounds.  Abdominal:     Palpations: Abdomen is soft.     Tenderness: There is no abdominal tenderness. There is no guarding or rebound.  Musculoskeletal:        General: No tenderness.     Comments: Trace edema to bilateral lower extremities. There is no significant swelling, tenderness to the right hand. Range of motion is intact throughout the hand.  Skin:     General: Skin is warm and dry.  Neurological:     Mental Status: He is alert and oriented to person, place, and time.     Comments: Five out of five strength in all four extremities with sensation to light touch intact in all four extremities.  Psychiatric:        Behavior: Behavior normal.     ED Results / Procedures / Treatments   Labs (all labs ordered are listed, but only abnormal results are displayed) Labs Reviewed  COMPREHENSIVE METABOLIC PANEL - Abnormal; Notable for the following components:      Result Value   CO2 18 (*)    Glucose, Bld 105 (*)  BUN 78 (*)    Creatinine, Ser 7.84 (*)    Calcium 7.7 (*)    Albumin 2.2 (*)    AST 98 (*)    GFR calc non Af Amer 7 (*)    GFR calc Af Amer 8 (*)    All other components within normal limits  CBC WITH DIFFERENTIAL/PLATELET - Abnormal; Notable for the following components:   WBC 3.8 (*)    RBC 1.66 (*)    Hemoglobin 5.9 (*)    HCT 18.6 (*)    MCV 112.0 (*)    MCH 35.5 (*)    RDW 19.0 (*)    Platelets 123 (*)    nRBC 1.9 (*)    Neutro Abs 1.6 (*)    Abs Immature Granulocytes 0.08 (*)    All other components within normal limits  CBG MONITORING, ED - Abnormal; Notable for the following components:   Glucose-Capillary 101 (*)    All other components within normal limits  SARS CORONAVIRUS 2 (TAT 6-24 HRS)  ETHANOL  PROTIME-INR  MAGNESIUM  TYPE AND SCREEN  PREPARE RBC (CROSSMATCH)    EKG None  Radiology DG Chest 2 View  Result Date: 07/23/2019 CLINICAL DATA:  Syncope EXAM: CHEST - 2 VIEW COMPARISON:  Chest radiograph dated 07/06/2019 FINDINGS: The heart remains enlarged. Mild left basilar atelectasis/airspace disease has decreased. Mild diffuse bilateral interstitial opacities are not significantly changed and likely represent pulmonary edema. There is no pleural effusion or pneumothorax. IMPRESSION: 1. Cardiomegaly with mild pulmonary edema. 2. Decreased left basilar atelectasis/airspace disease. Electronically  Signed   By: Zerita Boers M.D.   On: 07/23/2019 15:41    Procedures Procedures (including critical care time) CRITICAL CARE Performed by: [name]   Total critical care time: 35 minutes  Critical care time was exclusive of separately billable procedures and treating other patients.  Critical care was necessary to treat or prevent imminent or life-threatening deterioration.  Critical care was time spent personally by me on the following activities: development of treatment plan with patient and/or surrogate as well as nursing, discussions with consultants, evaluation of patient's response to treatment, examination of patient, obtaining history from patient or surrogate, ordering and performing treatments and interventions, ordering and review of laboratory studies, ordering and review of radiographic studies, pulse oximetry and re-evaluation of patient's condition.  Medications Ordered in ED Medications  0.9 %  sodium chloride infusion (Manually program via Guardrails IV Fluids) (has no administration in time range)  sodium chloride 0.9 % bolus 500 mL (500 mLs Intravenous New Bag/Given 07/23/19 1603)    ED Course  I have reviewed the triage vital signs and the nursing notes.  Pertinent labs & imaging results that were available during my care of the patient were reviewed by me and considered in my medical decision making (see chart for details).    MDM Rules/Calculators/A&P                     Patient here for evaluation of lightheadedness, syncope versus near syncope. He is orthostatic in the emergency department. Labs significant for progressive anemia. Patient denies any bleeding at home. Labs are also significant for progressive renal failure. Concern for symptomatic anemia, will transfuse. Internal medicine consulted for admission.  Final Clinical Impression(s) / ED Diagnoses Final diagnoses:  Symptomatic anemia  Orthostasis  Stage 5 chronic kidney disease not on  chronic dialysis ([place])    Rx / DC Orders ED Discharge Orders    None  [name], MD 07/24/19 [phone]

## 2019-07-23 NOTE — ED Notes (Signed)
Pt able to stand and transfere from [facility] to bed with out assistance.

## 2019-07-23 NOTE — ED Notes (Signed)
During assessment Pt reported he has not been able to walk in for ever. Pt can not state how long the for ever is.

## 2019-07-23 NOTE — H&P (Addendum)
Date: 07/23/2019               Patient Name:  ANTWANE GROSE MRN: [id number]  DOB: Apr 15, 1961 Age / Sex: 59 y.o., male   PCP: [name], NP         Medical Service: Internal Medicine Teaching Service         Attending Physician: Dr. Evette Doffing, Mallie Mussel, *    First Contact: Dr. Ronnald Ramp Pager: [phone]  Second Contact: Dr. Sharon Seller Pager: [phone]       After Hours (After 5p/  First Contact Pager: [phone]  weekends / holidays): Second Contact Pager: [phone]   Chief Complaint: syncope  History of Present Illness:   59yo male with PMH HTN, CKD Stage V, Vitamin B12 deficiency, GI bleed, chronic anemia presenting after becoming dizzy and [place]. He states he suddenly became dizzy and weak while standing and fell without LOC or hitting his head. He states this has not happened before. He no longer feels dizzy.  He denies recent illness, nausea, vomiting, abdominal pain, melena, BRB per rectum, diarrhea, constipation, difficulty with urination, dysuria. He states his legs feel weak now but did not earlier today. He denies changes in vision, numbness or tingling.  He was recently admitted for upper GI bleed earlier in the month with melena and hemoglobin <7 requiring transfusion. He has not yet had his follow-up appointment with GI. He also was planning to follow-up with nephrology for outpatient renal biopsy for CKD but that appointment is not until April 9th.   In the ED patient had normal vital signs but was found to have hemoglobin of 5.8. He was transfused 1U of blood.   Social:   He lives at home with his cousin.  He drinks 1 40oz per day  He smokes   Family History:   Family History  Problem Relation Age of Onset  . Colon cancer Neg Hx      Meds:  Current Meds  Medication Sig  . blood glucose meter kit and supplies KIT Dispense based on patient and insurance preference. Use up to four times daily as directed. (FOR ICD-9 250.00,  250.01).  . chlorthalidone (HYGROTON) 50 MG tablet Take 1 tablet (50 mg total) by mouth daily.  . ferrous sulfate 325 (65 FE) MG EC tablet Take 1 tablet (325 mg total) by mouth 2 (two) times daily.  . folic acid (FOLVITE) 1 MG tablet Take 1 tablet (1 mg total) by mouth daily.  . hydrochlorothiazide (HYDRODIURIL) 25 MG tablet Take 25 mg by mouth daily.  . sodium bicarbonate 650 MG tablet Take 2 tablets (1,300 mg total) by mouth 2 (two) times daily.  . vitamin B-12 (CYANOCOBALAMIN) 1000 MCG tablet Take 1 tablet (1,000 mcg total) by mouth daily.     Allergies: Allergies as of 07/23/2019  . (No Known Allergies)   Past Medical History:  Diagnosis Date  . Diabetes mellitus without complication ([place])   . Hypertension   . ICH (intracerebral hemorrhage) ([place]) 05/20/2017  . Shoulder pain, left 06/28/2013     Review of Systems: A complete ROS was negative except as per HPI.   Physical Exam: Blood pressure (!) 145/84, pulse 82, temperature 97.9 F (36.6 C), temperature source Oral, resp. rate 17, height 5' 11"  (1.803 m), weight 122.5 kg, SpO2 100 %.  Constitution: NAD, sitting up in bed, non-ill appearing Eyes: eom intact, perrla Cardio: RRR, no m/r/g, +1 pitting edema, +JVD Respiratory: CTA, no  w/r/r Abdominal: NTTP, soft, non-distended  Neuro: CN II-XII intact Skin: c/d/i    EKG: personally reviewed my interpretation is NSR with prolonged QTc  CXR: personally reviewed my interpretation is cardiomegaly, mild pulmonary edema  Assessment & Plan by Problem: Active Problems:   Acute on chronic anemia  59yo male with PMH HTN, CKD Stage V, Vitamin B12 deficiency, GI bleed, chronic anemia presenting after becoming dizzy and [place] found to have hemoglobin of 5.8.    Acute on Chronic Macrocytic Anemia Chronic Thrombocytopenia New Onset Leukopenia Previously admitted earlier this month for acute GI bleed with EGD, colonoscopy and capsule endoscopy done. Source  of bleeding was not found although there was hematin on colonoscopy. EGD biopsy did show peptic duodenitis. Except for his fall he has no signs of recurrence of acute GI bleed. Additionally, his hemoglobin has remained ~7 and his current symptomatic anemia may be secondary to his CKD. He was given aranesp on 3/4 per nephro.  He has a history of vitamin B12 deficiency and is not sure if he is currently taking B12. He received feraheme 12/20 and 3/21 during acute anemia admissions requiring transfusion.   - transfuse 2U  - cont. protonix 40 mg qd, start carafate qid  - pathologist smear review  - B12 level, MMA  - consider aranesp again as last dose was nearly one month ago.  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 Previous work-up earlier this month showed proteinuria with Pr/Cr ratio of 1.75, no M spike on SPEP, unremarkable ANA, dsDNA, GMB, ANCA, and normal C3/C4. Renal US showed no signs of obstruction. Nephrology offered renal biopsy but he opted to have this done outpatient and has not yet had his follow-up appointment.  During last discharge his losartan and HCTZ were held. His PCP recently started chlorthalidone for LE edema but he has not taken this yet. He does have sign of LE edema but endorses that he is not having any issues with urination. He is approaching dialysis but will attempt lasix challenge. It may be prudent for him to undergo renal biopsy while he is admitted if he is amenable.   - hold chlorthalidone  - lasix 60 mg IV - discuss renal biopsy with him in the am.   HTN BP medications include clonidine .80m, which he states he is not taking; norvasc 10 mg. Chlorthalidone was started recently by PCP but he is not taking this either.   - hold BP medications with recent fall - monitor for rebound hypertension  - d/c chlorthalidone with CKD   LE Weakness New bilateral LE weakness R>L. Denies injury with fall. Mild dysmetria on right.   -  CT head   Diet: renal  VTE: SCDs IVF: none Code: full  Dispo: Admit patient to Observation with expected length of stay less than 2 midnights.  Signed:Marty Heck DO 07/23/2019, 8:57 PM  Pager: [phone]

## 2019-07-23 NOTE — ED Triage Notes (Signed)
PT had a witnessed syncope episode out side [employer] today. Pt A/O for EMS . EMS reported Pt told them he drank one Beer today . Pt has been seen for adm with a GI bleed this month . Pt denies seeing any blood in his stool since last DC. EMS vitals  BP 120/69 , P 74, CBG 140  EMS gave Pt 450 ml IV NS.

## 2019-07-24 LAB — BPAM RBC
Blood Product Expiration Date: 202104082359
Blood Product Expiration Date: 202104172359
ISSUE DATE / TIME: 202103271828
ISSUE DATE / TIME: 202103272258
Unit Type and Rh: 1700
Unit Type and Rh: 7300

## 2019-07-24 LAB — BASIC METABOLIC PANEL
Anion gap: 12 (ref 5–15)
BUN: 75 mg/dL — ABNORMAL HIGH (ref 6–20)
CO2: 21 mmol/L — ABNORMAL LOW (ref 22–32)
Calcium: 8.4 mg/dL — ABNORMAL LOW (ref 8.9–10.3)
Chloride: 110 mmol/L (ref 98–111)
Creatinine, Ser: 7.71 mg/dL — ABNORMAL HIGH (ref 0.61–1.24)
GFR calc Af Amer: 8 mL/min — ABNORMAL LOW (ref >60)
GFR calc non Af Amer: 7 mL/min — ABNORMAL LOW (ref >60)
Glucose, Bld: 172 mg/dL — ABNORMAL HIGH (ref 70–99)
Potassium: 3.3 mmol/L — ABNORMAL LOW (ref 3.5–5.1)
Sodium: 143 mmol/L (ref 135–145)

## 2019-07-24 LAB — CBC WITH DIFFERENTIAL/PLATELET
Abs Immature Granulocytes: 0.07 10*3/uL (ref 0.00–0.07)
Basophils Absolute: 0 10*3/uL (ref 0.0–0.1)
Basophils Relative: 1 %
Eosinophils Absolute: 0.1 10*3/uL (ref 0.0–0.5)
Eosinophils Relative: 2 %
HCT: 24.4 % — ABNORMAL LOW (ref 39.0–52.0)
Hemoglobin: 8.1 g/dL — ABNORMAL LOW (ref 13.0–17.0)
Immature Granulocytes: 2 %
Lymphocytes Relative: 42 %
Lymphs Abs: 1.7 10*3/uL (ref 0.7–4.0)
MCH: 34.2 pg — ABNORMAL HIGH (ref 26.0–34.0)
MCHC: 33.2 g/dL (ref 30.0–36.0)
MCV: 103 fL — ABNORMAL HIGH (ref 80.0–100.0)
Monocytes Absolute: 0.5 10*3/uL (ref 0.1–1.0)
Monocytes Relative: 12 %
Neutro Abs: 1.6 10*3/uL — ABNORMAL LOW (ref 1.7–7.7)
Neutrophils Relative %: 41 %
Platelets: 137 10*3/uL — ABNORMAL LOW (ref 150–400)
RBC: 2.37 MIL/uL — ABNORMAL LOW (ref 4.22–5.81)
RDW: 22.4 % — ABNORMAL HIGH (ref 11.5–15.5)
WBC: 4 10*3/uL (ref 4.0–10.5)
nRBC: 1.5 % — ABNORMAL HIGH (ref 0.0–0.2)

## 2019-07-24 LAB — TYPE AND SCREEN
ABO/RH(D): B POS
Antibody Screen: NEGATIVE
Unit division: 0
Unit division: 0

## 2019-07-24 LAB — CBC
HCT: 23.9 % — ABNORMAL LOW (ref 39.0–52.0)
Hemoglobin: 8.1 g/dL — ABNORMAL LOW (ref 13.0–17.0)
MCH: 34.9 pg — ABNORMAL HIGH (ref 26.0–34.0)
MCHC: 33.9 g/dL (ref 30.0–36.0)
MCV: 103 fL — ABNORMAL HIGH (ref 80.0–100.0)
Platelets: 133 10*3/uL — ABNORMAL LOW (ref 150–400)
RBC: 2.32 MIL/uL — ABNORMAL LOW (ref 4.22–5.81)
RDW: 23.2 % — ABNORMAL HIGH (ref 11.5–15.5)
WBC: 4.1 10*3/uL (ref 4.0–10.5)
nRBC: 1.2 % — ABNORMAL HIGH (ref 0.0–0.2)

## 2019-07-24 LAB — HIV ANTIBODY (ROUTINE TESTING W REFLEX): HIV Screen 4th Generation wRfx: NONREACTIVE

## 2019-07-24 LAB — MAGNESIUM: Magnesium: 2.1 mg/dL (ref 1.7–2.4)

## 2019-07-24 LAB — FERRITIN: Ferritin: 313 ng/mL (ref 24–336)

## 2019-07-24 MED ORDER — AMLODIPINE BESYLATE 10 MG PO TABS
10.0000 mg | ORAL_TABLET | Freq: Every day | ORAL | Status: DC
  Administered 2019-07-24 – 2019-07-25 (×2): 10 mg via ORAL
  Filled 2019-07-24 (×2): qty 1

## 2019-07-24 MED ORDER — MUPIROCIN 2 % EX OINT: 1.0000 "application " | TOPICAL_OINTMENT | Freq: Two times a day (BID) | CUTANEOUS | Status: DC

## 2019-07-24 MED ORDER — FOLIC ACID 1 MG PO TABS
1.0000 mg | ORAL_TABLET | Freq: Every day | ORAL | Status: DC
  Administered 2019-07-24 – 2019-07-25 (×2): 1 mg via ORAL
  Filled 2019-07-24 (×2): qty 1

## 2019-07-24 NOTE — Progress Notes (Addendum)
   Subjective:   Feeling much better after his transfusion. Had episode of black bowel movement this morning.   Objective:  Vital signs in last 24 hours: Vitals:   07/23/19 2332 07/24/19 0215 07/24/19 0325 07/24/19 0500  BP: (!) 156/95 (!) 142/90 (!) 147/99   Pulse: 66 60 65   Resp: 18 17 19    Temp: 97.7 F (36.5 C) 98.3 F (36.8 C) 98.1 F (36.7 C)   TempSrc: Oral Oral Oral   SpO2: 100% 100% 100%   Weight:    121.2 kg  Height:       Constitution: NAD, appears stated age Cardio: RRR, no m/r/g  Respiratory: CTA, no w/r/r Abdominal: NTTP, soft, non-distended, +BS MSK: moving all extremities Neuro: normal affect, a&ox3 Skin: c/d/i   Assessment/Plan:  Active Problems:   Acute on chronic anemia   Stage 5 chronic kidney disease not on chronic dialysis [facility])  Two 59-year-old male with past medical history of hypertension, CKD stage V, vitamin B12 deficiency, GI bleed, chronic anemia presenting after becoming dizzy and falling [place] found to have a hemoglobin of 5.8.  Acute on chronic macrocytic anemia Chronic thrombocytopenia Acute on chronic anemia initially thought likely secondary to patient's CKD stage V.  This a.m. patient endorses one dark bowel movement.  Previous recent work-up earlier this month with normal EGD, colonoscopy, and capsule endoscopy except for hematin in the colon and duodenitis on EGD.  Possible recurrence of GI bleed.  He was transfused overnight with improvement in hemoglobin. B12 level normal.  - observe overnight, trend CBC, will need outpatient follow-up with GI  - ferritin  - continue PPI, carafate  - f/u with nephrology after discharge for monthly aranesp  - start folic acid - pathologist smear review pending   CKD Stage V He will follow-up with nephrology 4/9 for renal biopsy. Discussed having this done inpatient but he would like to defer. He is not having difficulty with urination. Some signs of volume o/l at admission and  may need diuretic at discharge - d/c chlorthalidone at discharge, he has not been taking this   HTN Blood pressure elevated today. He has not been taking clonidine .1mg  or chlorthalidone, which was recently started by pcp  - restart norvasc  VTE: SCDs IVF: none Diet: renal  Code: full   Dispo: Anticipated discharge in approximately 1-2 days.   Marty Heck, DO 07/24/2019, 6:38 AM Pager: [phone]

## 2019-07-24 NOTE — Plan of Care (Signed)
  Problem: Education: Goal: Knowledge of General Education information will improve Description: Including pain rating scale, medication(s)/side effects and non-pharmacologic comfort measures Outcome: Progressing   Problem: Safety: Goal: Ability to remain free from injury will improve Outcome: Progressing   

## 2019-07-25 DIAGNOSIS — E538 Deficiency of other specified B group vitamins: Secondary | ICD-10-CM

## 2019-07-25 LAB — BASIC METABOLIC PANEL
Anion gap: 13 (ref 5–15)
BUN: 76 mg/dL — ABNORMAL HIGH (ref 6–20)
CO2: 21 mmol/L — ABNORMAL LOW (ref 22–32)
Calcium: 8 mg/dL — ABNORMAL LOW (ref 8.9–10.3)
Chloride: 108 mmol/L (ref 98–111)
Creatinine, Ser: 7.58 mg/dL — ABNORMAL HIGH (ref 0.61–1.24)
GFR calc Af Amer: 8 mL/min — ABNORMAL LOW (ref >60)
GFR calc non Af Amer: 7 mL/min — ABNORMAL LOW (ref >60)
Glucose, Bld: 135 mg/dL — ABNORMAL HIGH (ref 70–99)
Potassium: 3.2 mmol/L — ABNORMAL LOW (ref 3.5–5.1)
Sodium: 142 mmol/L (ref 135–145)

## 2019-07-25 LAB — PATHOLOGIST SMEAR REVIEW

## 2019-07-25 LAB — CBC
HCT: 23.7 % — ABNORMAL LOW (ref 39.0–52.0)
Hemoglobin: 8 g/dL — ABNORMAL LOW (ref 13.0–17.0)
MCH: 34.3 pg — ABNORMAL HIGH (ref 26.0–34.0)
MCHC: 33.8 g/dL (ref 30.0–36.0)
MCV: 101.7 fL — ABNORMAL HIGH (ref 80.0–100.0)
Platelets: 133 10*3/uL — ABNORMAL LOW (ref 150–400)
RBC: 2.33 MIL/uL — ABNORMAL LOW (ref 4.22–5.81)
RDW: 22.4 % — ABNORMAL HIGH (ref 11.5–15.5)
WBC: 4.1 10*3/uL (ref 4.0–10.5)
nRBC: 1 % — ABNORMAL HIGH (ref 0.0–0.2)

## 2019-07-25 LAB — IRON AND TIBC
Iron: 79 ug/dL (ref 45–182)
Saturation Ratios: 22 % (ref 17.9–39.5)
TIBC: 357 ug/dL (ref 250–450)
UIBC: 278 ug/dL

## 2019-07-25 LAB — MAGNESIUM: Magnesium: 2.1 mg/dL (ref 1.7–2.4)

## 2019-07-25 MED ORDER — FUROSEMIDE 40 MG PO TABS: 40.0000 mg | ORAL_TABLET | Freq: Every day | ORAL | 0 refills | Status: DC

## 2019-07-25 MED FILL — FUROSEMIDE 40 MG TABLET: 40 | 30 days supply | Qty: 30 | Fill #0

## 2019-07-25 NOTE — Discharge Summary (Signed)
Name: Daniel [name] MRN: [id number] DOB: 1961-01-03 59 y.o. PCP: [name], NP  Date of Admission: 07/23/2019  1:48 PM Date of Discharge: 07/25/2019 Attending Physician: Dr. 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   blood glucose meter kit and supplies Kit Dispense based on patient and insurance preference. Use up to four times daily as directed. (FOR ICD-9 250.00, 250.01).   cloNIDine 0.2 MG tablet Commonly known as: CATAPRES Take  1 tablet twice daily . If makes drowsy take 2 tablets - after he is off of work   ferrous sulfate 325 (65 FE) MG EC tablet Take 1 tablet (325 mg total) by mouth 2 (two) times daily.   folic acid 1 MG tablet Commonly known as: FOLVITE Take 1 tablet (1 mg total) by mouth daily.   furosemide 40 MG tablet Commonly known as: Lasix Take 1 tablet (40 mg total) by mouth daily.   sodium bicarbonate 650 MG tablet Take 2 tablets (1,300 mg total) by mouth 2 (two) times daily.   vitamin B-12 1000 MCG tablet Commonly known as: CYANOCOBALAMIN Take 1 tablet (1,000 mcg total) by mouth daily.       Disposition and follow-up:   Daniel [name] was discharged from [facility] in Stable condition.  At the hospital follow up visit please address:  1.  Acute on chronic anemia - Hgb 5.9 on admission - s/p 2u pRBC with Hgb stable at 8 on discharge - suspect  advanced kidney disease is driving pt's recurrent anemia - continue outpatient follow-up with nephrology - continue iron, B12, and folate supplementation  CKD stage V, not on dialysis - started pt on 35m PO lasix daily at discharge, given shortness of breath as a symptom of volume overall on presentation - STOP pt's HCTZ and chlorthalidone - continue 1,3048msodium bicarb BID  HTN - pt's BP remains mildly elevated with systolic BP's in the [address]563O continue amlodipine 1072maily and clonidine 0.2mg77mD  - continue follow-up with nephrology as CKD likely contributing to persistent hypertension  2.  Labs / imaging needed at time of follow-up: recheck Hgb  3.  Pending labs/ test needing follow-up: none  Follow-up Appointments: Follow-up Information    [facility] up.   Specialty: Gastroenterology Why: Call for follow-up if you begin having black or red stools Contact information: [address] by problem list: 1. Daniel [name] 58 y31r old M with a significant PMH of chronic kidney disease stage V and moderate alcohol use disorder admitted after becoming dizzy and falling while walking [place] the mall and found to have an acute on chronic symptomatic anemia with a hemoglobin of 5.8.  He was admitted once in December with a similar anemia requiring a transfusion of blood and  IV iron.  Then he was admitted 2 subsequent times in March for similar anemia requiring blood transfusion.  He has had a full endoscopy evaluation with normal EGD, colonoscopy, and capsule endoscopy. CT head on this admission negative.  Of note, he has had 2 Feraheme infusions since December, last ferritin was 47 on 3/3, and repeat this admission 313 on 3/28.  He had a borderline B12 deficiency and has been supplementing with oral B12 with good improvement.  Pt needs to continue folic acid 1 mg daily given his ongoing  alcohol use. Advanced kidney disease appears to be driving his recurrent anemia given his endorsement of mostly normal brown stools and no evidence for acute bleeding. He is being evaluated as an outpatient by nephrology. Pt given 2u pRBCs with Hgb response to 8.1. Hgb stable at 8.0 on discharge on 3/29. Pt instructed to continue close outpatient follow-up with nephrology, his PCP, and GI.  Discharge Vitals:   BP (!) 195/100 (BP Location: Right Arm)   Pulse 83   Temp 97.8 F (36.6 C) (Oral)   Resp 18   Ht 5' 11"  (1.803 m)   Wt 120.1 kg   SpO2 100%   BMI 36.93 kg/m   Pertinent Labs, Studies, and Procedures:  CBC Latest Ref Rng & Units 07/25/2019 07/24/2019 07/24/2019  WBC 4.0 - 10.5 K/uL 4.1 4.1 4.0  Hemoglobin 13.0 - 17.0 g/dL 8.0(L) 8.1(L) 8.1(L)  Hematocrit 39.0 - 52.0 % 23.7(L) 23.9(L) 24.4(L)  Platelets 150 - 400 K/uL 133(L) 133(L) 137(L)   BMP Latest Ref Rng & Units 07/25/2019 07/24/2019 07/23/2019  Glucose 70 - 99 mg/dL 135(H) 172(H) 105(H)  BUN 6 - 20 mg/dL 76(H) 75(H) 78(H)  Creatinine 0.61 - 1.24 mg/dL 7.58(H) 7.71(H) 7.84(H)  Sodium 135 - 145 mmol/L 142 143 140  Potassium 3.5 - 5.1 mmol/L 3.2(L) 3.3(L) 3.6  Chloride 98 - 111 mmol/L 108 110 111  CO2 22 - 32 mmol/L 21(L) 21(L) 18(L)  Calcium 8.9 - 10.3 mg/dL 8.0(L) 8.4(L) 7.7(L)   Lab Results  Component Value Date   VITAMINB12 699 07/23/2019   Iron/TIBC/Ferritin/ %Sat    Component Value Date/Time   IRON 79 07/25/2019 0433   TIBC 357 07/25/2019 0433   FERRITIN 313 07/24/2019 1410   IRONPCTSAT 22 07/25/2019 0433   Urinalysis    Component Value Date/Time   COLORURINE YELLOW 07/17/2019 0016   APPEARANCEUR CLEAR 07/17/2019 0016   LABSPEC 1.012 07/17/2019 0016   PHURINE 5.0 07/17/2019 0016   GLUCOSEU NEGATIVE 07/17/2019 0016   HGBUR SMALL (A) 07/17/2019 0016   BILIRUBINUR NEGATIVE 07/17/2019 0016   KETONESUR NEGATIVE 07/17/2019 0016   PROTEINUR 100 (A) 07/17/2019 0016   UROBILINOGEN 0.2 12/31/2009 0746   NITRITE  NEGATIVE 07/17/2019 0016   LEUKOCYTESUR NEGATIVE 07/17/2019 0016    EKG Interpretation  Date/Time:  Saturday July 23 2019 14:19:45 EDT Ventricular Rate:  68 PR Interval:  180 QRS Duration: 94 QT Interval:  460 QTC Calculation: 489 R Axis:   -14 Text Interpretation: Normal sinus rhythm Prolonged QT Abnormal ECG Confirmed by Isla Pence [phone]) on 07/24/2019 2:37:38 PM      Path Review Macrocytic anemia   Comment: Left shift with nucleated RBC  Thrombocytopenia  Reviewed by Lennox Solders. Lyndon Code, M.D.   DG Chest 2 View  Result Date: 07/23/2019 CLINICAL DATA:  Syncope EXAM: CHEST - 2 VIEW COMPARISON:  Chest radiograph dated 07/06/2019 FINDINGS: The heart remains enlarged. Mild left basilar atelectasis/airspace disease has decreased. Mild diffuse bilateral interstitial opacities are not  significantly changed and likely represent pulmonary edema. There is no pleural effusion or pneumothorax. IMPRESSION: 1. Cardiomegaly with mild pulmonary edema. 2. Decreased left basilar atelectasis/airspace disease. Electronically Signed   By: Zerita Boers M.D.   On: 07/23/2019 15:41   CT HEAD WO CONTRAST  Result Date: 07/23/2019 CLINICAL DATA:  Focal neuro deficit, > 6 hrs, stroke suspected. Syncope. EXAM: CT HEAD WITHOUT CONTRAST TECHNIQUE: Contiguous axial images were obtained from the base of the skull through the vertex without intravenous contrast. COMPARISON:  Head CT 06/27/2019 FINDINGS: Brain: No intracranial hemorrhage, mass effect, or midline shift. Brain volume is normal for age. No hydrocephalus. The basilar cisterns are patent. Minor chronic small vessel ischemia. Remote lacunar infarcts in the right caudate and basal ganglia. No evidence of territorial infarct or acute ischemia. No extra-axial or intracranial fluid collection. Vascular: Atherosclerosis of skullbase vasculature without hyperdense vessel or abnormal calcification. Skull: No fracture or focal lesion. Sinuses/Orbits: Improved  maxillary sinus mucosal thickening from prior exam. No acute findings. Mastoid air cells are clear. Other: None. IMPRESSION: No acute intracranial abnormality. Electronically Signed   By: Keith Rake M.D.   On: 07/23/2019 21:03   Discharge Instructions: Discharge Instructions    Call MD for:   Complete by: As directed    Decreased urine out, shortness of breath, chest pain, leg swelling, or sudden weight gain   Call MD for:  persistant dizziness or light-headedness   Complete by: As directed    Call MD for:  persistant nausea and vomiting   Complete by: As directed    Call MD for:  temperature >100.4   Complete by: As directed    Diet - low sodium heart healthy   Complete by: As directed    Discharge instructions   Complete by: As directed    Daniel [name],  You were seen in the hospital for low blood counts that caused you to pass out. You were given 2 blood transfusions and the blood counts normalized. It is likely that your continued low blood counts and need for blood transfusions is because of your kidney disease and slow bleeding in your gut.  STOP taking - hydrochlorothiazide daily and chlorthalidone daily  START taking - furosemide 1 tablet (883m) daily, amlodipine 1 tablet (141m daily, and clonidine 1 tablet (0.83m64mtwice a day  Continue taking - sodium bicarbonate 2 tablets (1,300m30mwice a day, ferrous sulfate 1 tablet (325mg66mice a day, vitamin b12 dQ76y, and folic acid daily  Please follow-up with the kidney doctors on Friday April 9th. If you have any further episodes of black or red stool, then please call the office of [facility] to follow-up with the stomach doctors to continue investigating the bleeding.  Thank you for [place] of your [place]!   Increase activity slowly   Complete by: As directed       Signed: JonesLadona Horns3/29/2021, 10:03 AM   Pager: [phone]

## 2019-07-25 NOTE — Progress Notes (Signed)
   Subjective: Pt seen at the bedside this morning. Walking easily in the room/hallway. States he is feeling well. Denies any additional black bowel movements. Denies shortness of breath, chest pain, lightheadedness, or dizziness.   Pt ready to discharge home today. States he is aware of his nephrology appointment on 08/05/2019. Emphasized the continued importance of outpatient follow-up with his PCP, nephrology, and GI given pt's continued symptomatic anemia and need for transfusions.   Objective:  Vital signs in last 24 hours: Vitals:   07/24/19 0725 07/24/19 1929 07/24/19 2029 07/25/19 0447  BP: (!) 152/90 (!) 145/91 (!) 141/80 (!) 142/88  Pulse: 69 76 72 77  Resp: 18 19 20 19   Temp: 97.9 F (36.6 C) 98.4 F (36.9 C) 98.5 F (36.9 C) 99 F (37.2 C)  TempSrc: Oral Oral Oral Oral  SpO2: 100% 99% 100% 100%  Weight:      Height:       Physical Exam Vitals and nursing note reviewed.  Constitutional:      General: He is not in acute distress.    Appearance: He is obese. He is not ill-appearing.  Cardiovascular:     Rate and Rhythm: Normal rate and regular rhythm.     Heart sounds: Normal heart sounds.  Pulmonary:     Effort: Pulmonary effort is normal. No respiratory distress.  Neurological:     Mental Status: He is alert.  Psychiatric:        Mood and Affect: Mood normal.    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  Acute on chronic macrocytic anemia Chronic thrombocytopenia Acute on chronic anemia likely multifactorial given pt's CKD stage V and continued slow GI bleeding (due to  persistent black bowel movements). Prior GI work-up with EGD, colonoscopy, and capsule endoscopy unremarkable. Ferritin 313, iron 79, and TIBC 357. S/p 2u pRBCs. - Hgb stable this AM at 8.0 - path smea with macrocytic anemia, left shift with nucleated RBC, and thrombocytopenia - outpatient follow-up with GI as needed for continued dark stools - continue PPI, carafate  - nephrology follow-up on 4/9, will likely need monthly aranesp around that time too - continue supplementation with iron, B12, and folate  CKD Stage V Follow-up with nephrology 4/9 for renal biopsy.  - shortness of breath on admission consistent with a component of volume overload - will discharge with 40mg  PO lasix daily - pt to stop HCTZ and chlorthalidone at discharge - continue 1,300mg  sodium bicarb BID  HTN BP elevated 140-150s/80-90s. - continue amlodipine 10mg  daily - home meds to resume clonidine 0.2mg  BID  - continue follow-up with nephrology as CKD likely contributing to persistent hypertension  Prior to Admission Living Arrangement: home Anticipated Discharge Location: home Barriers to Discharge: none Dispo: Anticipated discharge today  Ladona Horns, MD 07/25/2019, 6:25 AM Pager: [phone]

## 2019-07-25 NOTE — Progress Notes (Signed)
Constant C Kijowski to be D/C'd home per MD order.  Discussed prescriptions and follow up appointments with the patient. Prescriptions given to patient, medication list explained in detail. Pt verbalized understanding.  Allergies as of 07/25/2019   No Known Allergies     Medication List    STOP taking these medications   chlorthalidone 50 MG tablet Commonly known as: HYGROTON   hydrochlorothiazide 25 MG tablet Commonly known as: HYDRODIURIL     TAKE these medications   amLODipine 10 MG tablet Commonly known as: NORVASC Take 1 tablet (10 mg total) by mouth daily.   blood glucose meter kit and supplies Kit Dispense based on patient and insurance preference. Use up to four times daily as directed. (FOR ICD-9 250.00, 250.01).   cloNIDine 0.2 MG tablet Commonly known as: CATAPRES Take  1 tablet twice daily . If makes drowsy take 2 tablets - after he is off of work   ferrous sulfate 325 (65 FE) MG EC tablet Take 1 tablet (325 mg total) by mouth 2 (two) times daily.   folic acid 1 MG tablet Commonly known as: FOLVITE Take 1 tablet (1 mg total) by mouth daily.   furosemide 40 MG tablet Commonly known as: Lasix Take 1 tablet (40 mg total) by mouth daily.   sodium bicarbonate 650 MG tablet Take 2 tablets (1,300 mg total) by mouth 2 (two) times daily.   vitamin B-12 1000 MCG tablet Commonly known as: CYANOCOBALAMIN Take 1 tablet (1,000 mcg total) by mouth daily.       Vitals:   07/25/19 0906 07/25/19 1003  BP: (!) 195/100 (!) 146/81  Pulse: 83 85  Resp: 18   Temp: 97.8 F (36.6 C)   SpO2: 100%     Skin clean, dry and intact without evidence of skin break down, no evidence of skin tears noted. IV catheter discontinued intact. Site without signs and symptoms of complications. Dressing and pressure applied. Cardiac monitoring discontinued. Pt denies pain at this time. No complaints noted.  An After Visit Summary was printed and given to the patient. Patient escorted via  [place], and D/C home via private auto.  [place] 07/25/2019 11:20 AM

## 2019-07-26 ENCOUNTER — Telehealth: Payer: Self-pay

## 2019-07-26 NOTE — Telephone Encounter (Signed)
Transition Care Management Follow-up Telephone Call Date of discharge and from where:  07/25/2019, [facility].  Call placed to # [phone], unable to leave a message, voicemail full.  Patient has an appt with PCP 08/04/2019 @ 1430.

## 2019-07-26 NOTE — Telephone Encounter (Signed)
Transition Care Management Follow-up Telephone Call Date of discharge and from where:  07/25/2019, [facility].  Tired to reach patient at [phone], unable to leave a message, voicemail full.  Patient has an appt with PCP 08/04/2019 @ 1430.

## 2019-07-27 ENCOUNTER — Telehealth: Payer: Self-pay

## 2019-07-27 NOTE — Telephone Encounter (Signed)
Transition Care Management Follow-up Telephone Call  Date of discharge and from where: 07/25/2019, [facility]    Call placed to patient # [phone], unable to leave a message, voicemail full.  Call placed to patient's contact  - Josephina Gip (Brother)  [phone] and informed that the patient could be reached at # [phone].  Call then placed to that number and message left with call back requested to this CM.   Patient has an appointment at [facility] 08/04/2019

## 2019-07-30 LAB — METHYLMALONIC ACID, SERUM: Methylmalonic Acid, Quantitative: 380 nmol/L — ABNORMAL HIGH (ref 0–378)

## 2019-07-31 ENCOUNTER — Other Ambulatory Visit: Payer: Self-pay

## 2019-07-31 ENCOUNTER — Encounter (HOSPITAL_COMMUNITY): Payer: Self-pay

## 2019-07-31 ENCOUNTER — Inpatient Hospital Stay (HOSPITAL_COMMUNITY)
Admission: EM | Admit: 2019-07-31 | Discharge: 2019-08-09 | DRG: 981 | Disposition: A | Discharge status: Home / Self Care | Payer: Self-pay | Attending: Internal Medicine | Admitting: Internal Medicine

## 2019-07-31 DIAGNOSIS — N2581 Secondary hyperparathyroidism of renal origin: Secondary | ICD-10-CM | POA: Diagnosis present

## 2019-07-31 DIAGNOSIS — Z95828 Presence of other vascular implants and grafts: Secondary | ICD-10-CM

## 2019-07-31 DIAGNOSIS — N185 Chronic kidney disease, stage 5: Secondary | ICD-10-CM | POA: Diagnosis present

## 2019-07-31 DIAGNOSIS — K317 Polyp of stomach and duodenum: Secondary | ICD-10-CM | POA: Diagnosis present

## 2019-07-31 DIAGNOSIS — Z9181 History of falling: Secondary | ICD-10-CM

## 2019-07-31 DIAGNOSIS — E872 Acidosis: Secondary | ICD-10-CM | POA: Diagnosis present

## 2019-07-31 DIAGNOSIS — F419 Anxiety disorder, unspecified: Secondary | ICD-10-CM | POA: Diagnosis present

## 2019-07-31 DIAGNOSIS — Z79899 Other long term (current) drug therapy: Secondary | ICD-10-CM

## 2019-07-31 DIAGNOSIS — F329 Major depressive disorder, single episode, unspecified: Secondary | ICD-10-CM | POA: Diagnosis present

## 2019-07-31 DIAGNOSIS — Z9889 Other specified postprocedural states: Secondary | ICD-10-CM

## 2019-07-31 DIAGNOSIS — K5730 Diverticulosis of large intestine without perforation or abscess without bleeding: Secondary | ICD-10-CM

## 2019-07-31 DIAGNOSIS — K5520 Angiodysplasia of colon without hemorrhage: Secondary | ICD-10-CM

## 2019-07-31 DIAGNOSIS — D638 Anemia in other chronic diseases classified elsewhere: Secondary | ICD-10-CM

## 2019-07-31 DIAGNOSIS — D62 Acute posthemorrhagic anemia: Secondary | ICD-10-CM | POA: Diagnosis present

## 2019-07-31 DIAGNOSIS — E119 Type 2 diabetes mellitus without complications: Secondary | ICD-10-CM

## 2019-07-31 DIAGNOSIS — B182 Chronic viral hepatitis C: Secondary | ICD-10-CM | POA: Diagnosis present

## 2019-07-31 DIAGNOSIS — D631 Anemia in chronic kidney disease: Secondary | ICD-10-CM | POA: Diagnosis present

## 2019-07-31 DIAGNOSIS — E1122 Type 2 diabetes mellitus with diabetic chronic kidney disease: Secondary | ICD-10-CM | POA: Diagnosis present

## 2019-07-31 DIAGNOSIS — I10 Essential (primary) hypertension: Secondary | ICD-10-CM | POA: Diagnosis present

## 2019-07-31 DIAGNOSIS — R296 Repeated falls: Secondary | ICD-10-CM | POA: Diagnosis present

## 2019-07-31 DIAGNOSIS — Z419 Encounter for procedure for purposes other than remedying health state, unspecified: Secondary | ICD-10-CM

## 2019-07-31 DIAGNOSIS — D696 Thrombocytopenia, unspecified: Secondary | ICD-10-CM | POA: Diagnosis present

## 2019-07-31 DIAGNOSIS — Z992 Dependence on renal dialysis: Secondary | ICD-10-CM

## 2019-07-31 DIAGNOSIS — E669 Obesity, unspecified: Secondary | ICD-10-CM | POA: Diagnosis present

## 2019-07-31 DIAGNOSIS — R269 Unspecified abnormalities of gait and mobility: Secondary | ICD-10-CM

## 2019-07-31 DIAGNOSIS — F17210 Nicotine dependence, cigarettes, uncomplicated: Secondary | ICD-10-CM | POA: Diagnosis present

## 2019-07-31 DIAGNOSIS — I951 Orthostatic hypotension: Secondary | ICD-10-CM | POA: Diagnosis present

## 2019-07-31 DIAGNOSIS — K5521 Angiodysplasia of colon with hemorrhage: Principal | ICD-10-CM | POA: Diagnosis present

## 2019-07-31 DIAGNOSIS — N186 End stage renal disease: Secondary | ICD-10-CM | POA: Diagnosis present

## 2019-07-31 DIAGNOSIS — Z20822 Contact with and (suspected) exposure to covid-19: Secondary | ICD-10-CM | POA: Diagnosis present

## 2019-07-31 DIAGNOSIS — I120 Hypertensive chronic kidney disease with stage 5 chronic kidney disease or end stage renal disease: Secondary | ICD-10-CM | POA: Diagnosis present

## 2019-07-31 DIAGNOSIS — D649 Anemia, unspecified: Secondary | ICD-10-CM

## 2019-07-31 DIAGNOSIS — R011 Cardiac murmur, unspecified: Secondary | ICD-10-CM

## 2019-07-31 DIAGNOSIS — E876 Hypokalemia: Secondary | ICD-10-CM | POA: Diagnosis present

## 2019-07-31 DIAGNOSIS — N179 Acute kidney failure, unspecified: Secondary | ICD-10-CM | POA: Diagnosis present

## 2019-07-31 DIAGNOSIS — D500 Iron deficiency anemia secondary to blood loss (chronic): Secondary | ICD-10-CM | POA: Diagnosis present

## 2019-07-31 LAB — BASIC METABOLIC PANEL
Anion gap: 17 — ABNORMAL HIGH (ref 5–15)
BUN: 104 mg/dL — ABNORMAL HIGH (ref 6–20)
CO2: 17 mmol/L — ABNORMAL LOW (ref 22–32)
Calcium: 7.7 mg/dL — ABNORMAL LOW (ref 8.9–10.3)
Chloride: 102 mmol/L (ref 98–111)
Creatinine, Ser: 8.06 mg/dL — ABNORMAL HIGH (ref 0.61–1.24)
GFR calc Af Amer: 8 mL/min — ABNORMAL LOW (ref >60)
GFR calc non Af Amer: 7 mL/min — ABNORMAL LOW (ref >60)
Glucose, Bld: 190 mg/dL — ABNORMAL HIGH (ref 70–99)
Potassium: 3.1 mmol/L — ABNORMAL LOW (ref 3.5–5.1)
Sodium: 136 mmol/L (ref 135–145)

## 2019-07-31 LAB — URINALYSIS, ROUTINE W REFLEX MICROSCOPIC
Bacteria, UA: NONE SEEN
Bilirubin Urine: NEGATIVE
Glucose, UA: NEGATIVE mg/dL
Ketones, ur: NEGATIVE mg/dL
Leukocytes,Ua: NEGATIVE
Nitrite: NEGATIVE
Protein, ur: 100 mg/dL — AB
Specific Gravity, Urine: 1.009 (ref 1.005–1.030)
pH: 5 (ref 5.0–8.0)

## 2019-07-31 LAB — CBG MONITORING, ED: Glucose-Capillary: 178 mg/dL — ABNORMAL HIGH (ref 70–99)

## 2019-07-31 LAB — CBC
HCT: 21.2 % — ABNORMAL LOW (ref 39.0–52.0)
Hemoglobin: 7.1 g/dL — ABNORMAL LOW (ref 13.0–17.0)
MCH: 34.5 pg — ABNORMAL HIGH (ref 26.0–34.0)
MCHC: 33.5 g/dL (ref 30.0–36.0)
MCV: 102.9 fL — ABNORMAL HIGH (ref 80.0–100.0)
Platelets: 114 10*3/uL — ABNORMAL LOW (ref 150–400)
RBC: 2.06 MIL/uL — ABNORMAL LOW (ref 4.22–5.81)
RDW: 19.4 % — ABNORMAL HIGH (ref 11.5–15.5)
WBC: 4.3 10*3/uL (ref 4.0–10.5)
nRBC: 0 % (ref 0.0–0.2)

## 2019-07-31 LAB — RENAL FUNCTION PANEL
Albumin: 2.3 g/dL — ABNORMAL LOW (ref 3.5–5.0)
Anion gap: 15 (ref 5–15)
BUN: 105 mg/dL — ABNORMAL HIGH (ref 6–20)
CO2: 18 mmol/L — ABNORMAL LOW (ref 22–32)
Calcium: 7.7 mg/dL — ABNORMAL LOW (ref 8.9–10.3)
Chloride: 104 mmol/L (ref 98–111)
Creatinine, Ser: 8.25 mg/dL — ABNORMAL HIGH (ref 0.61–1.24)
GFR calc Af Amer: 7 mL/min — ABNORMAL LOW (ref >60)
GFR calc non Af Amer: 6 mL/min — ABNORMAL LOW (ref >60)
Glucose, Bld: 113 mg/dL — ABNORMAL HIGH (ref 70–99)
Phosphorus: 4.5 mg/dL (ref 2.5–4.6)
Potassium: 2.9 mmol/L — ABNORMAL LOW (ref 3.5–5.1)
Sodium: 137 mmol/L (ref 135–145)

## 2019-07-31 LAB — PREPARE RBC (CROSSMATCH)

## 2019-07-31 LAB — RESPIRATORY PANEL BY RT PCR (FLU A&B, COVID)
Influenza A by PCR: NEGATIVE
Influenza B by PCR: NEGATIVE
SARS Coronavirus 2 by RT PCR: NEGATIVE

## 2019-07-31 MED ORDER — PANTOPRAZOLE SODIUM 40 MG PO TBEC
40.0000 mg | DELAYED_RELEASE_TABLET | Freq: Every day | ORAL | Status: DC
  Administered 2019-07-31 – 2019-08-09 (×9): 40 mg via ORAL
  Filled 2019-07-31 (×9): qty 1

## 2019-07-31 MED ORDER — SODIUM BICARBONATE 650 MG PO TABS
1300.0000 mg | ORAL_TABLET | Freq: Two times a day (BID) | ORAL | Status: DC
  Administered 2019-07-31 – 2019-08-03 (×5): 1300 mg via ORAL
  Filled 2019-07-31 (×6): qty 2

## 2019-07-31 MED ORDER — SODIUM CHLORIDE 0.9% FLUSH: 3.0000 mL | Freq: Once | INTRAVENOUS | Status: DC

## 2019-07-31 MED ORDER — SODIUM CHLORIDE 0.9 % IV SOLN: 10.0000 mL/h | Freq: Once | INTRAVENOUS | Status: DC

## 2019-07-31 NOTE — ED Notes (Signed)
Pt has unsteady gait when standing

## 2019-07-31 NOTE — ED Provider Notes (Signed)
[facility] EMERGENCY DEPARTMENT Provider Note   CSN: [id number] Arrival date & time: 07/31/19  1646     History Chief Complaint  Patient presents with  . Dizziness    Daniel [name] is a 59 y.o. male.  Who presents the emergency department for weakness and fall.  The patient has stage V  kidney disease and is not yet on dialysis.  He has recurrent bouts of symptomatic anemia likely secondary to his kidney function.  He was admitted once in December with a similar anemia requiring a transfusion of blood and IV iron.  Then he was admitted 2 subsequent times in March for similar anemia requiring blood transfusion.  He has had a full endoscopy evaluation with normal EGD, colonoscopy, and capsule endoscopy.  The patient feels overall very weak.  Today he got up out of his car and he got very lightheaded and fell to the ground.  He did not hit his head or lose consciousness and he has no complaint of injuries.  Patient states that he does not know why his blood keeps getting so low.  Denies melena or hematochezia  .        Past Medical History:  Diagnosis Date  . Diabetes mellitus without complication ([place])   . Hypertension   . ICH (intracerebral hemorrhage) ([place]) 05/20/2017  . Shoulder pain, left 06/28/2013    Patient Active Problem List   Diagnosis Date Noted  . Acute on chronic anemia 07/23/2019  . Stage 5 chronic kidney disease not on chronic dialysis ([place])   . Chronic hepatitis C without hepatic coma ([place]) 07/11/2019  . Iron deficiency anemia 03/30/2019  . CKD (chronic kidney disease) stage 4, GFR 15-29 ml/min (HCC) 03/30/2019  . Alcohol use disorder 03/30/2019  . B12 deficiency 03/30/2019  . Anemia   . Hypertension 06/29/2016  . DM2 (diabetes mellitus, type 2) ([place]) 06/29/2016    Past Surgical History:  Procedure Laterality Date  . APPENDECTOMY    . BIOPSY  06/30/2019   Procedure: BIOPSY;  Surgeon: Ronald Lobo, MD;  Location: [facility];   Service: Endoscopy;;  . COLONOSCOPY  01/23/2012   Procedure: COLONOSCOPY;  Surgeon: Danie Binder, MD;  [facility];  Service: Endoscopy;  Laterality: N/A;  11:10 AM  . COLONOSCOPY WITH PROPOFOL N/A 06/30/2019   Procedure: COLONOSCOPY WITH PROPOFOL;  Surgeon: Ronald Lobo, MD;  Location: [facility];  Service: Endoscopy;  Laterality: N/A;  . ESOPHAGOGASTRODUODENOSCOPY (EGD) WITH PROPOFOL N/A 06/30/2019   Procedure: ESOPHAGOGASTRODUODENOSCOPY (EGD) WITH PROPOFOL;  Surgeon: Ronald Lobo, MD;  Location: [facility];  Service: Endoscopy;  Laterality: N/A;  . GIVENS CAPSULE STUDY N/A 06/30/2019   Procedure: GIVENS CAPSULE STUDY;  Surgeon: Ronald Lobo, MD;  Location: [facility];  Service: Endoscopy;  Laterality: N/A;  . INCISION AND DRAINAGE ABSCESS N/A 06/29/2016   Procedure: INCISION AND DRAINAGE ABDOMINAL WALL ABSCESS;  Surgeon: Alphonsa Overall, MD;  [facility];  Service: General;  Laterality: N/A;  . Left heel surgery         Family History  Problem Relation Age of Onset  . Colon cancer Neg Hx     Social History   Tobacco Use  . Smoking status: Current Every Day Smoker    Packs/day: 1.00    Years: 30.00    Pack years: 30.00    Types: Cigarettes  . Smokeless tobacco: Never Used  Substance Use Topics  . Alcohol use: Yes    Alcohol/week: 12.0 standard drinks  Types: 12 Cans of beer per week  . Drug use: Yes    Types: "Crack" cocaine    Home Medications Prior to Admission medications   Medication Sig Start Date End Date Taking? Authorizing Provider  amLODipine (NORVASC) 10 MG tablet Take 1 tablet (10 mg total) by mouth daily. Patient not taking: Reported on 07/23/2019 07/13/19 07/12/20  [name], NP  blood glucose meter kit and supplies KIT Dispense based on patient and insurance preference. Use up to four times daily as directed. (FOR ICD-9 250.00, 250.01). 07/01/16   Nita Sells, MD  cloNIDine (CATAPRES) 0.2 MG tablet Take  1 tablet twice  daily . If makes drowsy take 2 tablets - after he is off of work Patient not taking: Reported on 07/23/2019 07/13/19   [name], NP  ferrous sulfate 325 (65 FE) MG EC tablet Take 1 tablet (325 mg total) by mouth 2 (two) times daily. 07/13/19 08/12/19  [name], NP  folic acid (FOLVITE) 1 MG tablet Take 1 tablet (1 mg total) by mouth daily. 01/25/19   Kathrene Alu, MD  furosemide (LASIX) 40 MG tablet Take 1 tablet (40 mg total) by mouth daily. 07/25/19 08/24/19  Ladona Horns, MD  sodium bicarbonate 650 MG tablet Take 2 tablets (1,300 mg total) by mouth 2 (two) times daily. 07/01/19   Ladona Horns, MD  vitamin B-12 (CYANOCOBALAMIN) 1000 MCG tablet Take 1 tablet (1,000 mcg total) by mouth daily. 07/13/19 08/12/19  [name], NP    Allergies    Patient has no known allergies.  Review of Systems   Review of Systems Ten systems reviewed and are negative for acute change, except as noted in the HPI.   Physical Exam Updated Vital Signs BP 131/70 (BP Location: Right Arm)   Pulse 70   Temp 98 F (36.7 C) (Oral)   Resp 20   Ht 5' 11"  (1.803 m)   Wt 124.7 kg   SpO2 97%   BMI 38.35 kg/m   Physical Exam Vitals and nursing note reviewed.  Constitutional:      General: He is not in acute distress.    Appearance: He is well-developed. He is not diaphoretic.     Comments: Ashen appearance ashen appearance, pale conjunctiva  HENT:     Head: Normocephalic and atraumatic.  Eyes:     General: No scleral icterus.    Conjunctiva/sclera: Conjunctivae normal.  Cardiovascular:     Rate and Rhythm: Normal rate and regular rhythm.     Heart sounds: Normal heart sounds.  Pulmonary:     Effort: Pulmonary effort is normal. No respiratory distress.     Breath sounds: Normal breath sounds.  Abdominal:     Palpations: Abdomen is soft.     Tenderness: There is no abdominal tenderness.  Musculoskeletal:     Cervical back: Normal range of motion and neck supple.  Skin:     General: Skin is warm and dry.  Neurological:     Mental Status: He is alert.  Psychiatric:        Behavior: Behavior normal.     ED Results / Procedures / Treatments   Labs (all labs ordered are listed, but only abnormal results are displayed) Labs Reviewed  BASIC METABOLIC PANEL - Abnormal; Notable for the following components:      Result Value   Potassium 3.1 (*)    CO2 17 (*)    Glucose, Bld 190 (*)    BUN 104 (*)  Creatinine, Ser 8.06 (*)    Calcium 7.7 (*)    GFR calc non Af Amer 7 (*)    GFR calc Af Amer 8 (*)    Anion gap 17 (*)    All other components within normal limits  CBC - Abnormal; Notable for the following components:   RBC 2.06 (*)    Hemoglobin 7.1 (*)    HCT 21.2 (*)    MCV 102.9 (*)    MCH 34.5 (*)    RDW 19.4 (*)    Platelets 114 (*)    All other components within normal limits  CBG MONITORING, ED - Abnormal; Notable for the following components:   Glucose-Capillary 178 (*)    All other components within normal limits  URINALYSIS, ROUTINE W REFLEX MICROSCOPIC  TYPE AND SCREEN  PREPARE RBC (CROSSMATCH)    EKG None  Radiology No results found.  Procedures .Critical Care Performed by: Margarita Mail, PA-C Authorized by: Margarita Mail, PA-C   Critical care provider statement:    Critical care time (minutes):  50   Critical care time was exclusive of:  Separately billable procedures and treating other patients   Critical care was necessary to treat or prevent imminent or life-threatening deterioration of the following conditions:  Renal failure and circulatory failure   Critical care was time spent personally by me on the following activities:  Discussions with consultants, evaluation of patient's response to treatment, examination of patient, ordering and performing treatments and interventions, ordering and review of laboratory studies, ordering and review of radiographic studies, pulse oximetry, re-evaluation of patient's condition,  obtaining history from patient or surrogate and review of old charts   (including critical care time)  Medications Ordered in ED Medications  sodium chloride flush (NS) 0.9 % injection 3 mL (has no administration in time range)  0.9 %  sodium chloride infusion (has no administration in time range)    ED Course  I have reviewed the triage vital signs and the nursing notes.  Pertinent labs & imaging results that were available during my care of the patient were reviewed by me and considered in my medical decision making (see chart for details).  Clinical Course as of Jul 30 2029  Nancy Fetter Jul 31, 2019  1819 CBC(!) [AH]    Clinical Course User Index [AH] Margarita Mail, PA-C   MDM Rules/Calculators/A&P                      This patient complains of weakness/ near syncope, this involves an extensive number of treatment options, and is a complaint that carries with it a high risk of complications and morbidity.  The differential diagnosis includes The differential for syncope is extensive and includes, but is not limited to: arrythmia (Vtach, SVT, SSS, sinus arrest, AV block, bradycardia) aortic stenosis, AMI, HOCM, PE, atrial myxoma, pulmonary hypertension, orthostatic hypotension, (hypovolemia, drug effect, GB syndrome, micturition, cough, swall) carotid sinus sensitivity, Seizure, TIA/CVA, hypoglycemia,  Vertigo.   I Ordered, reviewed, and interpreted labs, which included cbc, bmp, cbg which show chronic sequelae of end-stage renal disease with elevated creatinine which is at baseline.  BUN is also more elevated than normal.  Anion gap is 17 likely from his elevated BUN.  The patient's CBC shows macrocytic anemia with a hemoglobin of 7.1 down about 1 g from his discharge hemoglobin on 07/16/2019   Additional history obtained from internal medicine resident service who is very familiar with this patient and has had multiple admissions,  review of EMR Previous records obtained and reviewed  I  consulted the internal medicine teaching service to discuss the patient's case and for admission and discussed lab    Critical interventions: Patient with positive orthostatic hypotension with a systolic pressure of 040 which dropped to 109 with standing.  He is also very weak and unable to maintain that position for a long period of time.  I have ordered 2 units of packed red blood cells for transfusion.  Patient will need admission for further management.  Apparently he has returning so quickly that he is unable to follow-up with his specialist especially with nephrology.  Discussed potentially obtaining a consult with nephrology during the visit.  Final Clinical Impression(s) / ED Diagnoses Final diagnoses:  Symptomatic anemia  Orthostatic hypotension  Chronic kidney disease, stage V [facility])    Rx / DC Orders ED Discharge Orders    None       Margarita Mail, PA-C 07/31/19 2036    Dorie Rank, MD 08/01/19 [phone]

## 2019-07-31 NOTE — ED Triage Notes (Signed)
Pt reports he has had dizziness for "months".  No dizziness when sitting but when standing gait is off balance and falls.  Pt does drive but states as long as he is sitting there is no dizziness.  Denies vomiting but states stools are black and his doctor is aware.

## 2019-07-31 NOTE — ED Notes (Signed)
Pt signed blood consent form and copy is at bedside. Originally copy sent to Medical Records.

## 2019-07-31 NOTE — H&P (Signed)
Date: 07/31/2019               Patient Name:  Daniel [name] MRN: [id number]  DOB: 1960/08/21 Age / Sex: 59 y.o., male   PCP: [name], NP         Medical Service: Internal Medicine Teaching Service         Attending Physician: Dr. Velna Ochs, MD    First Contact: Sheppard Coil, MD, Mitzi Hansen Pager: [place] [phone])  Second Contact: Masoudi, MD, [place] Pager: EM [phone])       After Hours (After 5p/  First Contact Pager: [phone]  weekends / holidays): Second Contact Pager: [phone]   Chief Complaint: Ground-level fall, dizziness  History of Present Illness: [name] is a 59 year old very pleasant gentleman with medical history significant for chronic kidney disease stage V, type 2 diabetes mellitus, anemia of chronic disease, chronic hepatitis C and hypertension who is presenting after a ground-level fall in setting of dizziness.  Mr. Sanguinetti reports that he has been feeling dizzy and weak for the past several months.  He also reports of gait abnormalities which results in frequent falls.  He states that today he got out of his car, felt dizzy and subsequently fell to the ground. He denies LOC, trauma, fevers, chills, sore throat, sick contacts, chest pain, SOB. He states that he becomes fatigued easily especially in the morning. He states that his eyes have been watering and itching for the past few weeks. He continues to endorse melanic stools. Also reports of dysuria, urinary frequency. He states that he has not been able to follow up with nephrology because he is uninsured.  He endorses anxiety and depression regarding his medical problems.  Per chart review, he has had multiple admissions to the hospital and ED visits for dizziness thought to be secondary to symptomatic anemia.  His last admission to the hospital was from July 23, 2019 to July 25, 2019 where he was found to have a hemoglobin of 5.9.  He received 2 unit PRBC transfusion and was instructed to  follow-up with nephrology. He has undergone an endoscopy which which was unremarkable but did reveal duodenal polyp versus prominent papular; colonoscopy was also performed which revealed diverticulosis of the sigmoid colon and old blood in the terminal ileum.  He also underwent a capsule endoscopy which revealed no AVMs, ulceration, diverticular change or masses.  ED course: Afebrile, pulse 77, respiratory rate 18, BP 111/79, SPO2 99% on room air.  BMP showed potassium 3.1, bicarb 17, BUN 104, creatinine 8.06, GFR 8, anion gap 17.  CBC reviewed hemoglobin 7.1, hematocrit 21, MCV 102. Orthostatic vitals were performed and he had systolic BP dropped from 546-568.  He was typed and screened for 2 PRBC transfusion.   Lab Orders     Respiratory Panel by RT PCR (Flu A&B, Covid) - Nasopharyngeal Swab     Basic metabolic panel     CBC     CBC     Renal function panel     Erythropoietin     PTH, intact and calcium     Urinalysis, Routine w reflex microscopic     Hepatic function panel     CBG monitoring, ED   Meds:  No outpatient medications have been marked as taking for the 07/31/19 encounter [facility] Encounter).   Allergies: Allergies as of 07/31/2019  . (No Known Allergies)   Past Medical History:  Diagnosis Date  . Diabetes mellitus without complication ([place])   .  Hypertension   . ICH (intracerebral hemorrhage) ([place]) 05/20/2017  . Shoulder pain, left 06/28/2013   Past Surgical History:  Procedure Laterality Date  . APPENDECTOMY    . BIOPSY  06/30/2019   Procedure: BIOPSY;  Surgeon: Ronald Lobo, MD;  Location: [facility];  Service: Endoscopy;;  . COLONOSCOPY  01/23/2012   Procedure: COLONOSCOPY;  Surgeon: Danie Binder, MD;  [facility];  Service: Endoscopy;  Laterality: N/A;  11:10 AM  . COLONOSCOPY WITH PROPOFOL N/A 06/30/2019   Procedure: COLONOSCOPY WITH PROPOFOL;  Surgeon: Ronald Lobo, MD;  Location: [place];  Service: Endoscopy;  Laterality: N/A;  .  ESOPHAGOGASTRODUODENOSCOPY (EGD) WITH PROPOFOL N/A 06/30/2019   Procedure: ESOPHAGOGASTRODUODENOSCOPY (EGD) WITH PROPOFOL;  Surgeon: Ronald Lobo, MD;  Location: [place];  Service: Endoscopy;  Laterality: N/A;  . GIVENS CAPSULE STUDY N/A 06/30/2019   Procedure: GIVENS CAPSULE STUDY;  Surgeon: Ronald Lobo, MD;  Location: [facility];  Service: Endoscopy;  Laterality: N/A;  . INCISION AND DRAINAGE ABSCESS N/A 06/29/2016   Procedure: INCISION AND DRAINAGE ABDOMINAL WALL ABSCESS;  Surgeon: Alphonsa Overall, MD;  [facility];  Service: General;  Laterality: N/A;  . Left heel surgery     Family History:  Family History  Problem Relation Age of Onset  . Colon cancer Neg Hx    Social History:  Social History   Tobacco Use  . Smoking status: Current Every Day Smoker    Packs/day: 1.00    Years: 30.00    Pack years: 30.00    Types: Cigarettes  . Smokeless tobacco: Never Used  Substance Use Topics  . Alcohol use: Yes    Alcohol/week: 12.0 standard drinks    Types: 12 Cans of beer per week  . Drug use: Yes    Types: "Crack" cocaine   Review of Systems: A complete ROS was negative except as per HPI.   Physical Exam: Blood pressure (!) 156/94, pulse 70, temperature 98 F (36.7 C), temperature source Oral, resp. rate 16, height 5\' 11"  (1.803 m), weight 124.7 kg, SpO2 98 %. Physical Exam  Constitutional: He is oriented to person, place, and time. No distress.  HENT:  Head: Normocephalic and atraumatic.  Cardiovascular: Normal rate, regular rhythm and normal heart sounds.  No murmur heard. Trace edema  Pulmonary/Chest: Effort normal.  Basilar crackles; occasional wheezes  Abdominal: Soft. Bowel sounds are normal. He exhibits no distension.  Musculoskeletal:        General: No deformity.     Cervical back: Normal range of motion.  Neurological: He is alert and oriented to person, place, and time.  Skin: Skin is warm and dry. He is not diaphoretic. No erythema.  Psychiatric:  Affect normal.   Results for orders placed or performed during the hospital encounter of 07/31/19 (from the past 24 hour(s))  CBG monitoring, ED     Status: Abnormal   Collection Time: 07/31/19  5:03 PM  Result Value Ref Range   Glucose-Capillary 178 (H) 70 - 99 mg/dL  Basic metabolic panel     Status: Abnormal   Collection Time: 07/31/19  5:13 PM  Result Value Ref Range   Sodium 136 135 - 145 mmol/L   Potassium 3.1 (L) 3.5 - 5.1 mmol/L   Chloride 102 98 - 111 mmol/L   CO2 17 (L) 22 - 32 mmol/L   Glucose, Bld 190 (H) 70 - 99 mg/dL   BUN 104 (H) 6 - 20 mg/dL   Creatinine, Ser 8.06 (H) 0.61 - 1.24 mg/dL  Calcium 7.7 (L) 8.9 - 10.3 mg/dL   GFR calc non Af Amer 7 (L) >60 mL/min   GFR calc Af Amer 8 (L) >60 mL/min   Anion gap 17 (H) 5 - 15  CBC     Status: Abnormal   Collection Time: 07/31/19  5:13 PM  Result Value Ref Range   WBC 4.3 4.0 - 10.5 K/uL   RBC 2.06 (L) 4.22 - 5.81 MIL/uL   Hemoglobin 7.1 (L) 13.0 - 17.0 g/dL   HCT 21.2 (L) 39.0 - 52.0 %   MCV 102.9 (H) 80.0 - 100.0 fL   MCH 34.5 (H) 26.0 - 34.0 pg   MCHC 33.5 30.0 - 36.0 g/dL   RDW 19.4 (H) 11.5 - 15.5 %   Platelets 114 (L) 150 - 400 K/uL   nRBC 0.0 0.0 - 0.2 %  Prepare RBC (crossmatch)     Status: None   Collection Time: 07/31/19  8:15 PM  Result Value Ref Range   Order Confirmation      ORDER PROCESSED BY BLOOD BANK Performed at [facility], [address], [place], [place] [address]   Renal function panel     Status: Abnormal   Collection Time: 07/31/19  8:33 PM  Result Value Ref Range   Sodium 137 135 - 145 mmol/L   Potassium 2.9 (L) 3.5 - 5.1 mmol/L   Chloride 104 98 - 111 mmol/L   CO2 18 (L) 22 - 32 mmol/L   Glucose, Bld 113 (H) 70 - 99 mg/dL   BUN 105 (H) 6 - 20 mg/dL   Creatinine, Ser 8.25 (H) 0.61 - 1.24 mg/dL   Calcium 7.7 (L) 8.9 - 10.3 mg/dL   Phosphorus 4.5 2.5 - 4.6 mg/dL   Albumin 2.3 (L) 3.5 - 5.0 g/dL   GFR calc non Af Amer 6 (L) >60 mL/min   GFR calc Af Amer 7 (L) >60 mL/min    Anion gap 15 5 - 15  Type and screen     Status: None (Preliminary result)   Collection Time: 07/31/19  8:40 PM  Result Value Ref Range   ABO/RH(D) B POS    Antibody Screen NEG    Sample Expiration 08/03/2019,2359    Unit Number [id number]    Blood Component Type RBC LR PHER1    Unit division 00    Status of Unit ALLOCATED    Transfusion Status OK TO TRANSFUSE    Crossmatch Result      Compatible Performed at [facility], [address] [place], [name] [address]    Unit Number [id number]    Blood Component Type RED CELLS,LR    Unit division 00    Status of Unit ALLOCATED    Transfusion Status OK TO TRANSFUSE    Crossmatch Result Compatible   Urinalysis, Routine w reflex microscopic     Status: Abnormal   Collection Time: 07/31/19  9:28 PM  Result Value Ref Range   Color, Urine YELLOW YELLOW   APPearance CLEAR CLEAR   Specific Gravity, Urine 1.009 1.005 - 1.030   pH 5.0 5.0 - 8.0   Glucose, UA NEGATIVE NEGATIVE mg/dL   Hgb urine dipstick SMALL (A) NEGATIVE   Bilirubin Urine NEGATIVE NEGATIVE   Ketones, ur NEGATIVE NEGATIVE mg/dL   Protein, ur 100 (A) NEGATIVE mg/dL   Nitrite NEGATIVE NEGATIVE   Leukocytes,Ua NEGATIVE NEGATIVE   WBC, UA 0-5 0 - 5 WBC/hpf   Bacteria, UA NONE SEEN NONE SEEN   EKG:  personally reviewed my interpretation is unremarkable  CXR: Not performed  No results found.  Assessment & Plan by Problem:  Mr. Jim is a 59 year old male with frequent recent admissions for symptomatic anemia and GI blood loss with a history of CKD stage V, hypertension and diabetes here for symptomatic anemia and falls likely due to GI blood loss related anemia versus anemia of chronic renal failure.  Principal Problem:   Symptomatic anemia -Mr. Baird Cancer has been frequently readmitted for symptomatic anemia thought to be related to GI blood loss -Has received blood transfusions on prior admissions -Underwent colonoscopy and endoscopy with old blood  identified in the terminal ileum but with indeterminate source of bleeding  -Patient states he is unable to follow-up with GI outpatient as he is uninsured -He endorses continued tarry bowel movements -He continues to endorse fatigue and weakness most pronounced when standing and walking resulting in multiple falls without trauma to his head or extremities or loss of consciousness -On exam, no signs of trauma, heart sounds normal, no obvious pallor -Hemoglobin of 7.1 on admission up from 5.9 eight days ago for which he was admitted and underwent blood transfusion  Plan: -Cardiac monitoring -Continuous pulse ox -Daily CBC -Follow-up erythropoietin level -Continue Protonix, sodium bicarb -Transfuse 2 units  Active Problems potassium is 3.1 CKD (chronic kidney disease), stage V (HCC)   -Patient with stage V chronic kidney disease not on dialysis and unable to follow-up with nephrology outpatient due to insurance status -CKD is likely contributing to his chronic and symptomatic anemia -Patient continues making urine although he does endorse recent dysuria, frequency and urgency -Creatinine 8.06 today from prior of 7.5 today; BUN is 104; has an anion gap of 17 and a bicarb of 17; calcium is 3.1  Plan: -Continue sodium bicarb -Follow-up erythropoietin level -Follow-up PTH level -Follow-up RFP -Follow-up UA -Consider nephrology consult in the a.m. -Renal diet -Avoid nephrotoxic medications -Replete potassium  Hypertension -Patient on clonidine and Lasix at home with stable BP here  Plan: -Hold antihypertensives at this time in setting of dizziness and falls  Dispo: Admit patient to Observation with expected length of stay less than 2 midnights.  Signed: Al Decant, MD 07/31/2019, 10:04 PM  Pager: [phone] Internal Medicine Teaching Service

## 2019-07-31 NOTE — ED Notes (Signed)
Pt stated that he was feeling fine. Rechecked temperature which was 97.5. Pt has remianed afebrile.

## 2019-08-01 DIAGNOSIS — E119 Type 2 diabetes mellitus without complications: Secondary | ICD-10-CM

## 2019-08-01 DIAGNOSIS — N185 Chronic kidney disease, stage 5: Secondary | ICD-10-CM

## 2019-08-01 DIAGNOSIS — D649 Anemia, unspecified: Secondary | ICD-10-CM

## 2019-08-01 LAB — HEPATIC FUNCTION PANEL
ALT: 44 U/L (ref 0–44)
AST: 111 U/L — ABNORMAL HIGH (ref 15–41)
Albumin: 2.2 g/dL — ABNORMAL LOW (ref 3.5–5.0)
Alkaline Phosphatase: 58 U/L (ref 38–126)
Bilirubin, Direct: 0.1 mg/dL (ref 0.0–0.2)
Indirect Bilirubin: 0.5 mg/dL (ref 0.3–0.9)
Total Bilirubin: 0.6 mg/dL (ref 0.3–1.2)
Total Protein: 7 g/dL (ref 6.5–8.1)

## 2019-08-01 LAB — CBC
HCT: 26 % — ABNORMAL LOW (ref 39.0–52.0)
Hemoglobin: 8.9 g/dL — ABNORMAL LOW (ref 13.0–17.0)
MCH: 33.5 pg (ref 26.0–34.0)
MCHC: 34.2 g/dL (ref 30.0–36.0)
MCV: 97.7 fL (ref 80.0–100.0)
Platelets: 110 10*3/uL — ABNORMAL LOW (ref 150–400)
RBC: 2.66 MIL/uL — ABNORMAL LOW (ref 4.22–5.81)
RDW: 19 % — ABNORMAL HIGH (ref 11.5–15.5)
WBC: 4.3 10*3/uL (ref 4.0–10.5)
nRBC: 0.5 % — ABNORMAL HIGH (ref 0.0–0.2)

## 2019-08-01 LAB — RENAL FUNCTION PANEL
Albumin: 2.2 g/dL — ABNORMAL LOW (ref 3.5–5.0)
Anion gap: 17 — ABNORMAL HIGH (ref 5–15)
BUN: 107 mg/dL — ABNORMAL HIGH (ref 6–20)
CO2: 20 mmol/L — ABNORMAL LOW (ref 22–32)
Calcium: 8.1 mg/dL — ABNORMAL LOW (ref 8.9–10.3)
Chloride: 105 mmol/L (ref 98–111)
Creatinine, Ser: 8.3 mg/dL — ABNORMAL HIGH (ref 0.61–1.24)
GFR calc Af Amer: 7 mL/min — ABNORMAL LOW (ref >60)
GFR calc non Af Amer: 6 mL/min — ABNORMAL LOW (ref >60)
Glucose, Bld: 118 mg/dL — ABNORMAL HIGH (ref 70–99)
Phosphorus: 7.2 mg/dL — ABNORMAL HIGH (ref 2.5–4.6)
Potassium: 3.3 mmol/L — ABNORMAL LOW (ref 3.5–5.1)
Sodium: 142 mmol/L (ref 135–145)

## 2019-08-01 MED ORDER — POTASSIUM CHLORIDE CRYS ER 20 MEQ PO TBCR
20.0000 meq | EXTENDED_RELEASE_TABLET | Freq: Once | ORAL | Status: AC
  Administered 2019-08-01: 20 meq via ORAL
  Filled 2019-08-01: qty 1

## 2019-08-01 NOTE — TOC Initial Note (Signed)
Transition of Care [facility]) - Initial/Assessment Note    Patient Details  Name: Daniel [name] MRN: [id number] Date of Birth: 1961-01-22  Transition of Care [facility]) CM/SW Contact:    Bartholomew Crews, RN Phone Number: [phone] 08/01/2019, 4:03 PM  Clinical Narrative:                 Spoke with patient at the bedside. PTA home with cousin. Veified contact information for his brother and his cousin. Patient is an existing patient at [facility] for primary care. [facility] is preferred pharmacy of choice. Continues to drive and states that his care is in the parking lot of the hospital. Uses a cane, but no other DME. Patient requesting assistance with Medicaid application and referral to servant center to assist with disability application. Patient stating he is not going to be able to work any more. Referral sent to financial counselor. TOC following for transition needs.   Expected Discharge Plan: Home/Self Care Barriers to Discharge: Continued Medical Work up   Patient Goals and CMS Choice   CMS Medicare.gov Compare Post Acute Care list provided to:: Patient    Expected Discharge Plan and Services Expected Discharge Plan: Home/Self Care In-house Referral: Financial Counselor, Clinical Social Work Discharge Planning Services: CM Consult   Living arrangements for the past 2 months: Single Family Home                 DME Arranged: N/A DME Agency: NA       HH Arranged: NA [place] Agency: NA        Prior Living Arrangements/Services Living arrangements for the past 2 months: Daniel [name] Lives with:: Self, Relatives Patient language and need for interpreter reviewed:: Yes            Current home services: DME(cane) Criminal Activity/Legal Involvement Pertinent to Current Situation/Hospitalization: No - Comment as needed  Activities of Daily Living Home Assistive Devices/Equipment: Cane (specify quad or straight) ADL Screening (condition at time of  admission) Patient's cognitive ability adequate to safely complete daily activities?: Yes Is the patient deaf or have difficulty hearing?: No Does the patient have difficulty seeing, even when wearing glasses/contacts?: No Does the patient have difficulty concentrating, remembering, or making decisions?: No Patient able to express need for assistance with ADLs?: Yes Does the patient have difficulty dressing or bathing?: No Independently performs ADLs?: Yes (appropriate for developmental age) Does the patient have difficulty walking or climbing stairs?: Yes Weakness of Legs: Both Weakness of Arms/Hands: None  Permission Sought/Granted                  Emotional Assessment Appearance:: Appears stated age Attitude/Demeanor/Rapport: Engaged Affect (typically observed): Accepting Orientation: : Oriented to Self, Oriented to  Time, Oriented to Place, Oriented to Situation Alcohol / Substance Use: Not Applicable Psych Involvement: No (comment)  Admission diagnosis:  Orthostatic hypotension [I95.1] Chronic kidney disease, stage V (HCC) [N18.5] Symptomatic anemia [D64.9] Patient Active Problem List   Diagnosis Date Noted  . Symptomatic anemia 07/31/2019  . Acute on chronic anemia 07/23/2019  . Chronic kidney disease, stage V ([place])   . Chronic hepatitis C without hepatic coma ([place]) 07/11/2019  . Iron deficiency anemia 03/30/2019  . CKD (chronic kidney disease), stage V ([place]) 03/30/2019  . Alcohol use disorder 03/30/2019  . B12 deficiency 03/30/2019  . Orthostatic hypotension 01/22/2019  . Anemia   . Hypertension 06/29/2016  . DM2 (diabetes mellitus, type 2) ([place]) 06/29/2016   PCP:  [name]  Mamie Nick, NP Pharmacy:   [facility], [place] [address] Phone: [phone] Fax: [phone]  [facility] [place] [place] - [address] [place] [address] Phone: [phone] Fax: [phone]     Social Determinants of Health (SDOH) Interventions    Readmission Risk Interventions Readmission Risk Prevention Plan 07/07/2019  Transportation Screening Complete  PCP or Specialist Appt within 3-5 Days Complete  HRI or [place] Complete  Social Work Consult for [place] Planning/Counseling Complete  Palliative Care Screening Not Applicable  Medication Review Press photographer) Complete  Some recent data might be hidden

## 2019-08-01 NOTE — Consult Note (Signed)
[facility] Consult: 12:29 PM 08/01/2019  LOS: 0 days    Referring Provider: Dr Philipp Ovens  Primary Care Physician:  [name], NP [facility] Primary Gastroenterologist: Althia Forts.    Reason for Consultation: Acute on chronic anemia.    HPI: Daniel [name] is a 59 y.o. male.  PMH Stage 5 CKD, not yet on dialysis.  Recurrent episodes of symptomatic anemia attributed mostly to his poor kidney function.  Hep C, untreated, oral count 3.7 million in 12/2018.  Diabetes.  Substance abuse. Has received transfusion of RBCs in 03/2019 and twice in March 2020. Received Feraheme twice since 08/2018.  12/2011 screening colonoscopy.  Dr. Carlyon Prows fields. Mild descending and sigmoid diverticulosis, internal hemorrhoids. Admitted 03/2019 with anemia requiring transfusion and IV iron.  2 admissions in March for transfusion requiring anemia.  Hgb 6.4 on 3/1,  5.9 on 3/28.[name]  Underwent 06/30/2019 EGD: normal other than a benign appearing duodenal polyp vs prominent papilla, pathology showed Foveolar metaplasia/peptic duodenitis. 06/30/19 colonoscopy.  Sigmoid diverticulosis.  Probable old blood in terminal ileum, raising question of small bowel source. 07/08/2018 capsule endoscopy.  Normal study.  No blood seen throughout the 13 hours of studies.  No AVMs, ulcers, diverticulosis, masses or other causes for blood and anemia He was discharged prior to the reading of the capsule study.  Dr. Cristina Gong suggested follow-up with his primary care provider and his note states "If for some reason care is not available to the patient, I would certainly be willing to see him in my office to follow his GI issues (I have given him my card).  Unfortunately, I am concerned that this patient will not be compliant with medical  follow-up.".[name] "patient has a high viral load from his hepatitis C, and will need referral to the infectious disease clinic for treatment of that" CTAP 07/17/2019: Mild gallbladder wall thickening with pericholecystic stranding, stone at level cystic duct.  No evidence cirrhosis or changes of any sort in the liver.  Patient had a dizzy spell resulting in a fall yesterday.  There was no LOC. She says his stools are more often dark than not but not melenic.  Does not see blood in his stool or burgundy stools.  Appetite is good, no nausea, vomiting.  No abdominal pain.  No heartburn. Hb 7.1 >> 2 PRBCs>>  8.9.  Was 8 on 3/29  MCV elevated.  Previously 111, currently 102. Platelets also low at 114 which is within previous range in March. INR 1 FOBT not yet tested    Past Medical History:  Diagnosis Date  . Diabetes mellitus without complication ([place])   . Hypertension   . ICH (intracerebral hemorrhage) ([place]) 05/20/2017  . Shoulder pain, left 06/28/2013    Past Surgical History:  Procedure Laterality Date  . APPENDECTOMY    . BIOPSY  06/30/2019   Procedure: BIOPSY;  Surgeon: Ronald Lobo, MD;  Location: [facility];  Service: Endoscopy;;  . COLONOSCOPY  01/23/2012   Procedure: COLONOSCOPY;  Surgeon: Danie Binder, MD;  [facility];  Service:  Endoscopy;  Laterality: N/A;  11:10 AM  . COLONOSCOPY WITH PROPOFOL N/A 06/30/2019   Procedure: COLONOSCOPY WITH PROPOFOL;  Surgeon: Ronald Lobo, MD;  Location: [facility];  Service: Endoscopy;  Laterality: N/A;  . ESOPHAGOGASTRODUODENOSCOPY (EGD) WITH PROPOFOL N/A 06/30/2019   Procedure: ESOPHAGOGASTRODUODENOSCOPY (EGD) WITH PROPOFOL;  Surgeon: Ronald Lobo, MD;  Location: [facility];  Service: Endoscopy;  Laterality: N/A;  . GIVENS CAPSULE STUDY N/A 06/30/2019   Procedure: GIVENS CAPSULE STUDY;  Surgeon: Ronald Lobo, MD;  Location: [facility];  Service: Endoscopy;  Laterality: N/A;  . INCISION AND DRAINAGE ABSCESS N/A 06/29/2016    Procedure: INCISION AND DRAINAGE ABDOMINAL WALL ABSCESS;  Surgeon: Alphonsa Overall, MD;  Location: [facility];  Service: General;  Laterality: N/A;  . Left heel surgery      Prior to Admission medications   Medication Sig Start Date End Date Taking? Authorizing Provider  amLODipine (NORVASC) 10 MG tablet Take 1 tablet (10 mg total) by mouth daily. 07/13/19 07/12/20  [name], NP  blood glucose meter kit and supplies KIT Dispense based on patient and insurance preference. Use up to four times daily as directed. (FOR ICD-9 250.00, 250.01). 07/01/16   Nita Sells, MD  cloNIDine (CATAPRES) 0.2 MG tablet Take  1 tablet twice daily . If makes drowsy take 2 tablets - after he is off of work Patient taking differently: Take 0.2 mg by mouth See admin instructions. Take 1 tablet (0.2 mg) by mouth twice daily, if it makes you drowsy take 2 tablets (0.4 mg) after work 07/13/19   [name], NP  ferrous sulfate 325 (65 FE) MG EC tablet Take 1 tablet (325 mg total) by mouth 2 (two) times daily. 07/13/19 08/12/19  [name], NP  folic acid (FOLVITE) 1 MG tablet Take 1 tablet (1 mg total) by mouth daily. 01/25/19   Kathrene Alu, MD  furosemide (LASIX) 40 MG tablet Take 1 tablet (40 mg total) by mouth daily. 07/25/19 08/24/19  Ladona Horns, MD  sodium bicarbonate 650 MG tablet Take 2 tablets (1,300 mg total) by mouth 2 (two) times daily. 07/01/19   Ladona Horns, MD  vitamin B-12 (CYANOCOBALAMIN) 1000 MCG tablet Take 1 tablet (1,000 mcg total) by mouth daily. 07/13/19 08/12/19  [name], NP    Scheduled Meds: . pantoprazole  40 mg Oral Daily  . sodium bicarbonate  1,300 mg Oral BID  . sodium chloride flush  3 mL Intravenous Once   Infusions: . sodium chloride     PRN Meds:    Allergies as of 07/31/2019  . (No Known Allergies)    Family History  Problem Relation Age of Onset  . Colon cancer Neg Hx     Social History   Socioeconomic History  . Marital status:  Single    Spouse name: Not on file  . Number of children: Not on file  . Years of education: Not on file  . Highest education level: Not on file  Occupational History  . Not on file  Tobacco Use  . Smoking status: Current Every Day Smoker    Packs/day: 1.00    Years: 30.00    Pack years: 30.00    Types: Cigarettes  . Smokeless tobacco: Never Used  Substance and Sexual Activity  . Alcohol use: Yes    Alcohol/week: 12.0 standard drinks    Types: 12 Cans of beer per week  . Drug use: Yes    Types: "Crack" cocaine  . Sexual activity: Yes  Birth control/protection: None  Other Topics Concern  . Not on file  Social History Narrative  . Not on file   Social Determinants of Health   Financial Resource Strain:   . Difficulty of Paying Living Expenses:   Food Insecurity:   . Worried About Charity fundraiser in the Last Year:   . Arboriculturist in the Last Year:   Transportation Needs:   . Film/video editor (Medical):   [name] Lack of Transportation (Non-Medical):   Physical Activity:   . Days of Exercise per Week:   . Minutes of Exercise per Session:   Stress:   . Feeling of Stress :   Social Connections:   . Frequency of Communication with Friends and Family:   . Frequency of Social Gatherings with Friends and Family:   . Attends Religious Services:   . Active Member of Clubs or Organizations:   . Attends Archivist Meetings:   [name] Marital Status:   Intimate Partner Violence:   . Fear of Current or Ex-Partner:   . Emotionally Abused:   [name] Physically Abused:   . Sexually Abused:     REVIEW OF SYSTEMS: Constitutional: Not feeling dizzy today. ENT:  No nose bleeds Pulm: Not short of breath CV:  No palpitations, no LE edema.  GU:  No hematuria, no frequency GI: See HPI. Heme: Denies unusual or excessive bleeding or bruising. Transfusions: See HPI. Neuro:  No headaches, no peripheral tingling or numbness.  Near syncope but did not lose consciousness  yesterday. Derm:  No itching, no rash or sores.  Endocrine:  No sweats or chills.  No polyuria or dysuria Immunization: Not queried Travel:  None beyond local counties in last few months.    PHYSICAL EXAM: Vital signs in last 24 hours: Vitals:   08/01/19 0516 08/01/19 0955  BP: (!) 141/79 (!) 144/86  Pulse: 72 72  Resp: 18 (!) 24  Temp: 98.4 F (36.9 C) 97.9 F (36.6 C)  SpO2: 100% 100%   Wt Readings from Last 3 Encounters:  07/31/19 124.7 kg  07/25/19 120.1 kg  07/16/19 130 kg    General: Obese, comfortable looking.  Does not appear acutely or chronically ill. Head: No facial asymmetry or swelling.  No signs head trauma. Eyes: No scleral icterus.  No conjunctival pallor. Ears: Slightly hard of hearing Nose: No discharge Mouth: Moist, pink, clear mucosa.  Tongue midline Neck: No JVD, no masses, no thyromegaly. Lungs: Clear bilaterally.  No cough, no labored breathing. Heart: RRR.  No MRG.  S1, S2 present Abdomen: Soft, obese.  Not tender.  Not distended.  No HSM, masses, bruits, hernias.   Rectal: Deferred Musc/Skeltl: No joint redness, swelling or gross deformity. Extremities: Trace lower extremity pedal/ankle edema.. Neurologic: Oriented x3.  Fluid speech.  Moves all 4 limbs, strength not tested.  No tremors. Skin: No rash, no sores, no suspicious lesions. Nodes: No cervical adenopathy. Psych: Calm, cooperative, pleasant.  Intake/Output from previous day: 04/04 0701 - 04/05 0700 In: 1007 [P.O.:360; Blood:647] Out: 1100 [Urine:1100] Intake/Output this shift: Total I/O In: 240 [P.O.:240] Out: 300 [Urine:300]  LAB RESULTS: Recent Labs    07/31/19 1713 08/01/19 0702  WBC 4.3 4.3  HGB 7.1* 8.9*  HCT 21.2* 26.0*  PLT 114* 110*   BMET Lab Results  Component Value Date   NA 142 08/01/2019   NA 137 07/31/2019   NA 136 07/31/2019   K 3.3 (L) 08/01/2019   K 2.9 (L) 07/31/2019  K 3.1 (L) 07/31/2019   CL 105 08/01/2019   CL 104 07/31/2019   CL 102  07/31/2019   CO2 20 (L) 08/01/2019   CO2 18 (L) 07/31/2019   CO2 17 (L) 07/31/2019   GLUCOSE 118 (H) 08/01/2019   GLUCOSE 113 (H) 07/31/2019   GLUCOSE 190 (H) 07/31/2019   BUN 107 (H) 08/01/2019   BUN 105 (H) 07/31/2019   BUN 104 (H) 07/31/2019   CREATININE 8.30 (H) 08/01/2019   CREATININE 8.25 (H) 07/31/2019   CREATININE 8.06 (H) 07/31/2019   CALCIUM 8.1 (L) 08/01/2019   CALCIUM 7.7 (L) 07/31/2019   CALCIUM 7.7 (L) 07/31/2019   LFT Recent Labs    07/31/19 2033 08/01/19 0702  PROT  --  7.0  ALBUMIN 2.3* 2.2*  2.2*  AST  --  111*  ALT  --  44  ALKPHOS  --  58  BILITOT  --  0.6  BILIDIR  --  0.1  IBILI  --  0.5   PT/INR Lab Results  Component Value Date   INR 1.0 07/23/2019   INR 1.0 06/27/2019   INR 1.0 01/22/2019   Hepatitis Panel No results for input(s): HEPBSAG, HCVAB, HEPAIGM, HEPBIGM in the last 72 hours. C-Diff No components found for: CDIFF Lipase     Component Value Date/Time   LIPASE 84 (H) 07/16/2019 2336    Drugs of Abuse     Component Value Date/Time   LABOPIA NONE DETECTED 03/29/2019 1209   COCAINSCRNUR POSITIVE (A) 03/29/2019 1209   LABBENZ NONE DETECTED 03/29/2019 1209   AMPHETMU NONE DETECTED 03/29/2019 1209   THCU NONE DETECTED 03/29/2019 1209   LABBARB NONE DETECTED 03/29/2019 1209     RADIOLOGY STUDIES: No results found.    IMPRESSION:   *   Acute on chronic anemia.  Has required multiple transfusions over the last several months. Last week iron, TIBC, iron saturation, ferritin, Y70 normal.  Folic acid normal 10/19/7626 and 04/17/2019. 2 units transfused thus far this admission. Underwent EGD, colonoscopy, capsule endoscopy as related above in HPI.  *   Untreated hepatitis C, no evidence for cirrhosis on CT scan last month.  AST 111, albumin 2.2, otherwise normal LFTs.  *    Stage V CKD.  *    Hypokalemia.  *    Lack of healthcare insurance complicating follow-up with primary care as well as specialist  providers.    PLAN:     *  Per Dr Havery Moros.     Azucena Freed  08/01/2019, 12:29 PM Phone [phone]

## 2019-08-01 NOTE — Plan of Care (Signed)
  Problem: Safety: Goal: Ability to remain free from injury will improve Outcome: Progressing   

## 2019-08-01 NOTE — Anesthesia Preprocedure Evaluation (Addendum)
Anesthesia Evaluation  Patient identified by MRN, date of birth, ID band Patient awake    Reviewed: Allergy & Precautions, NPO status , Patient's Chart, lab work & pertinent test results  Airway Mallampati: III  TM Distance: >3 FB Neck ROM: Full    Dental  (+) Missing, Dental Advisory Given, Poor Dentition,    Pulmonary Current Smoker and Patient abstained from smoking.,    breath sounds clear to auscultation       Cardiovascular hypertension,  Rhythm:Regular Rate:Normal     Neuro/Psych    GI/Hepatic (+) Hepatitis -  Endo/Other  diabetes  Renal/GU Renal disease     Musculoskeletal   Abdominal Normal abdominal exam  (+)   Peds  Hematology  (+) anemia ,   Anesthesia Other Findings   Reproductive/Obstetrics                           Anesthesia Physical Anesthesia Plan  ASA: III  Anesthesia Plan: MAC   Post-op Pain Management:    Induction: Intravenous  PONV Risk Score and Plan: 2 and Ondansetron, Dexamethasone and Midazolam  Airway Management Planned: Nasal Cannula and Simple Face Mask  Additional Equipment:   Intra-op Plan:   Post-operative Plan:   Informed Consent: I have reviewed the patients History and Physical, chart, labs and discussed the procedure including the risks, benefits and alternatives for the proposed anesthesia with the patient or authorized representative who has indicated his/her understanding and acceptance.       Plan Discussed with: Anesthesiologist and CRNA  Anesthesia Plan Comments:        Anesthesia Quick Evaluation

## 2019-08-01 NOTE — Consult Note (Addendum)
[facility] NOTE   MRN : [id number]  Reason for Consult: AKI on CKD Referring Physician: Dr. Justin Mend  History of Present Illness: 59 y/o male with with CKD stage V.  We have been asked to provide permanent access and TDC placement.   He was admitted with symptomatic anemia 07/23/2019-07/25/2019 and transfused 2 units of packed red blood cells for hemoglobin of 5.9.   Endoscopic evaluation was unremarkable but did reveal duodenal polyp, colonoscopy was also performed and he underwent capsule endoscopy revealed no AVMs ulceration or diverticular disease.  In the emergency room he is found to have BUN of 104 creatinine 08.06.  Past medical history: type 2 diabetes mellitus, anemia of chronic disease, chronic hepatitis C and hypertension.         Current Facility-Administered Medications  Medication Dose Route Frequency Provider Last Rate Last Admin  . 0.9 %  sodium chloride infusion  10 mL/hr Intravenous Once Agyei, Obed K, MD      . pantoprazole (PROTONIX) EC tablet 40 mg  40 mg Oral Daily Agyei, Obed K, MD   40 mg at 08/01/19 0913  . sodium bicarbonate tablet 1,300 mg  1,300 mg Oral BID Jean Rosenthal, MD   1,300 mg at 08/01/19 0913  . sodium chloride flush (NS) 0.9 % injection 3 mL  3 mL Intravenous Once Agyei, Obed K, MD        Pt meds include: Statin :No Betablocker: No ASA: No Other anticoagulants/antiplatelets: none  Past Medical History:  Diagnosis Date  . Diabetes mellitus without complication ([place])   . Hypertension   . ICH (intracerebral hemorrhage) ([place]) 05/20/2017  . Shoulder pain, left 06/28/2013    Past Surgical History:  Procedure Laterality Date  . APPENDECTOMY    . BIOPSY  06/30/2019   Procedure: BIOPSY;  Surgeon: Ronald Lobo, MD;  Location: [facility];  Service: Endoscopy;;  . COLONOSCOPY  01/23/2012   Procedure: COLONOSCOPY;  Surgeon: Danie Binder, MD;  [facility];  Service: Endoscopy;  Laterality: N/A;  11:10  AM  . COLONOSCOPY WITH PROPOFOL N/A 06/30/2019   Procedure: COLONOSCOPY WITH PROPOFOL;  Surgeon: Ronald Lobo, MD;  Location: [facility];  Service: Endoscopy;  Laterality: N/A;  . ESOPHAGOGASTRODUODENOSCOPY (EGD) WITH PROPOFOL N/A 06/30/2019   Procedure: ESOPHAGOGASTRODUODENOSCOPY (EGD) WITH PROPOFOL;  Surgeon: Ronald Lobo, MD;  Location: [facility];  Service: Endoscopy;  Laterality: N/A;  . GIVENS CAPSULE STUDY N/A 06/30/2019   Procedure: GIVENS CAPSULE STUDY;  Surgeon: Ronald Lobo, MD;  Location: [facility];  Service: Endoscopy;  Laterality: N/A;  . INCISION AND DRAINAGE ABSCESS N/A 06/29/2016   Procedure: INCISION AND DRAINAGE ABDOMINAL WALL ABSCESS;  Surgeon: Alphonsa Overall, MD;  Location: [facility];  Service: General;  Laterality: N/A;  . Left heel surgery      Social History Social History   Tobacco Use  . Smoking status: Current Every Day Smoker    Packs/day: 1.00    Years: 30.00    Pack years: 30.00    Types: Cigarettes  . Smokeless tobacco: Never Used  Substance Use Topics  . Alcohol use: Yes    Alcohol/week: 12.0 standard drinks    Types: 12 Cans of beer per week  . Drug use: Yes    Types: "Crack" cocaine    Family History Family History  Problem Relation Age of Onset  . Colon cancer Neg Hx     No Known Allergies   REVIEW OF SYSTEMS  General: [ ]  Weight  loss, [ ]  Fever, [ ]  chills Neurologic: [ ]  Dizziness, [ ]  Blackouts, [ ]  Seizure [ ]  Stroke, [ ]  "Mini stroke", [ ]  Slurred speech, [ ]  Temporary blindness; [ ]  weakness in arms or legs, [ ]  Hoarseness [ ]  Dysphagia Cardiac: [ ]  Chest pain/pressure, [ ]  Shortness of breath at rest [ ]  Shortness of breath with exertion, [ ]  Atrial fibrillation or irregular heartbeat  Vascular: [ ]  Pain in legs with walking, [ ]  Pain in legs at rest, [ ]  Pain in legs at night,  [ ]  Non-healing ulcer, [ ]  Blood clot in vein/DVT,   Pulmonary: [ ]  Home oxygen, [ ]  Productive cough, [ ]  Coughing up blood, [ ]  Asthma,  [ ]   Wheezing [ ]  COPD Musculoskeletal:  [ ]  Arthritis, [ ]  Low back pain, [ ]  Joint pain Hematologic: [ ]  Easy Bruising, [x ] Anemia; [ ]  Hepatitis Gastrointestinal: [ ]  Blood in stool, [ ]  Gastroesophageal Reflux/heartburn, Urinary: [x ] chronic Kidney disease, [ ]  on HD - [ ]  MWF or [ ]  TTHS, [ ]  Burning with urination, [ ]  Difficulty urinating Skin: [ ]  Rashes, [ ]  Wounds Psychological: [ ]  Anxiety, [ ]  Depression  Physical Examination Vitals:   08/01/19 0200 08/01/19 0229 08/01/19 0516 08/01/19 0955  BP: 134/84 (!) 148/83 (!) 141/79 (!) 144/86  Pulse: 68 70 72 72  Resp: 18 18 18  (!) 24  Temp: 97.8 F (36.6 C) 97.7 F (36.5 C) 98.4 F (36.9 C) 97.9 F (36.6 C)  TempSrc: Oral Oral Oral Oral  SpO2: 97% 98% 100% 100%  Weight:      Height:       Body mass index is 38.35 kg/m.  General:  WDWN in NAD HENT: WNL Eyes: Pupils equal Pulmonary: normal non-labored breathing , without Rales, rhonchi,  wheezing Cardiac: RRR, without  Murmurs, rubs or gallops; No carotid bruits Abdomen: soft, NT, no masses Skin: no rashes, ulcers noted;  no Gangrene , no cellulitis; no open wounds;   Vascular Exam/Pulses:palpable radial and brachial pulses B UE   Musculoskeletal: no muscle wasting or atrophy; no edema  Neurologic: A&O X 3; Appropriate Affect ;  SENSATION: normal; MOTOR FUNCTION: 5/5 Symmetric Speech is fluent/normal   Significant Diagnostic Studies: CBC Lab Results  Component Value Date   WBC 4.3 08/01/2019   HGB 8.9 (L) 08/01/2019   HCT 26.0 (L) 08/01/2019   MCV 97.7 08/01/2019   PLT 110 (L) 08/01/2019    BMET    Component Value Date/Time   NA 142 08/01/2019 0702   K 3.3 (L) 08/01/2019 0702   CL 105 08/01/2019 0702   CO2 20 (L) 08/01/2019 0702   GLUCOSE 118 (H) 08/01/2019 0702   BUN 107 (H) 08/01/2019 0702   CREATININE 8.30 (H) 08/01/2019 0702   CALCIUM 8.1 (L) 08/01/2019 0702   GFRNONAA 6 (L) 08/01/2019 0702   GFRAA 7 (L) 08/01/2019 0702   Estimated Creatinine  Clearance: 12.9 mL/min (A) (by C-G formula based on SCr of 8.3 mg/dL (H)).  COAG Lab Results  Component Value Date   INR 1.0 07/23/2019   INR 1.0 06/27/2019   INR 1.0 01/22/2019     Non-Invasive Vascular Imaging:  pending  ASSESSMENT/PLAN:  CKD stage V Nephrology thinks he needs to start HD and have asked [place] to place a [facility] and permanent access.  He is right hand dominant.  Pending vein mapping.    He has a scheduled EGD study tomorrow.  We can plan  on left AV fistula and TDC placement Wednesday 08/03/2019.     Roxy Horseman 08/01/2019 2:53 PM  I have seen and evaluated the patient. I agree with the PA note as documented above.  59 year old male with stage V CKD and hepatitis C that was admitted with symptomatic anemia and hemoglobin 7.1.  Ultimately has been evaluated by GI and plan endoscopy tomorrow.  Vascular surgery has been asked to place tunneled dialysis catheter and permanent AV fistula access.  Patient is right-handed.  He has palpable pulses to bilateral upper extremities.  Discussed risks and benefits of tunneled dialysis catheter placement as well as permanent AV fistula access in his left arm.  He needs vein mapping which has been ordered.  Cannot do his procedure tomorrow since he is already scheduled with GI and cannot have multiple anesthetics in the same day.  We will plan for Wednesday with vascular surgery.  Marty Heck, MD [facility] Office: [phone]

## 2019-08-01 NOTE — H&P (View-Only) (Signed)
[facility] Consult: 12:29 PM 08/01/2019  LOS: 0 days    Referring Provider: Dr Philipp Ovens  Primary Care Physician:  [name], NP [facility] Primary Gastroenterologist: Althia Forts.    Reason for Consultation: Acute on chronic anemia.    HPI: Daniel [name] is a 59 y.o. male.  PMH Stage 5 CKD, not yet on dialysis.  Recurrent episodes of symptomatic anemia attributed mostly to his poor kidney function.  Hep C, untreated, oral count 3.7 million in 12/2018.  Diabetes.  Substance abuse. Has received transfusion of RBCs in 03/2019 and twice in March 2020. Received Feraheme twice since 08/2018.  12/2011 screening colonoscopy.  Dr. Carlyon Prows fields. Mild descending and sigmoid diverticulosis, internal hemorrhoids. Admitted 03/2019 with anemia requiring transfusion and IV iron.  2 admissions in March for transfusion requiring anemia.  Hgb 6.4 on 3/1,  5.9 on 3/28.[name]  Underwent 06/30/2019 EGD: normal other than a benign appearing duodenal polyp vs prominent papilla, pathology showed Foveolar metaplasia/peptic duodenitis. 06/30/19 colonoscopy.  Sigmoid diverticulosis.  Probable old blood in terminal ileum, raising question of small bowel source. 07/08/2018 capsule endoscopy.  Normal study.  No blood seen throughout the 13 hours of studies.  No AVMs, ulcers, diverticulosis, masses or other causes for blood and anemia He was discharged prior to the reading of the capsule study.  Dr. Cristina Gong suggested follow-up with his primary care provider and his note states "If for some reason care is not available to the patient, I would certainly be willing to see him in my office to follow his GI issues (I have given him my card).  Unfortunately, I am concerned that this patient will not be compliant with medical  follow-up.".[name] "patient has a high viral load from his hepatitis C, and will need referral to the infectious disease clinic for treatment of that" CTAP 07/17/2019: Mild gallbladder wall thickening with pericholecystic stranding, stone at level cystic duct.  No evidence cirrhosis or changes of any sort in the liver.  Patient had a dizzy spell resulting in a fall yesterday.  There was no LOC. She says his stools are more often dark than not but not melenic.  Does not see blood in his stool or burgundy stools.  Appetite is good, no nausea, vomiting.  No abdominal pain.  No heartburn. Hb 7.1 >> 2 PRBCs>>  8.9.  Was 8 on 3/29  MCV elevated.  Previously 111, currently 102. Platelets also low at 114 which is within previous range in March. INR 1 FOBT not yet tested    Past Medical History:  Diagnosis Date  . Diabetes mellitus without complication ([place])   . Hypertension   . ICH (intracerebral hemorrhage) ([place]) 05/20/2017  . Shoulder pain, left 06/28/2013    Past Surgical History:  Procedure Laterality Date  . APPENDECTOMY    . BIOPSY  06/30/2019   Procedure: BIOPSY;  Surgeon: Ronald Lobo, MD;  Location: [facility];  Service: Endoscopy;;  . COLONOSCOPY  01/23/2012   Procedure: COLONOSCOPY;  Surgeon: Danie Binder, MD;  [facility];  Service:  Endoscopy;  Laterality: N/A;  11:10 AM  . COLONOSCOPY WITH PROPOFOL N/A 06/30/2019   Procedure: COLONOSCOPY WITH PROPOFOL;  Surgeon: Ronald Lobo, MD;  Location: [place];  Service: Endoscopy;  Laterality: N/A;  . ESOPHAGOGASTRODUODENOSCOPY (EGD) WITH PROPOFOL N/A 06/30/2019   Procedure: ESOPHAGOGASTRODUODENOSCOPY (EGD) WITH PROPOFOL;  Surgeon: Ronald Lobo, MD;  Location: [place];  Service: Endoscopy;  Laterality: N/A;  . GIVENS CAPSULE STUDY N/A 06/30/2019   Procedure: GIVENS CAPSULE STUDY;  Surgeon: Ronald Lobo, MD;  Location: [place];  Service: Endoscopy;  Laterality: N/A;  . INCISION AND DRAINAGE ABSCESS N/A 06/29/2016    Procedure: INCISION AND DRAINAGE ABDOMINAL WALL ABSCESS;  Surgeon: Alphonsa Overall, MD;  [facility];  Service: General;  Laterality: N/A;  . Left heel surgery      Prior to Admission medications   Medication Sig Start Date End Date Taking? Authorizing Provider  amLODipine (NORVASC) 10 MG tablet Take 1 tablet (10 mg total) by mouth daily. 07/13/19 07/12/20  [name], NP  blood glucose meter kit and supplies KIT Dispense based on patient and insurance preference. Use up to four times daily as directed. (FOR ICD-9 250.00, 250.01). 07/01/16   Nita Sells, MD  cloNIDine (CATAPRES) 0.2 MG tablet Take  1 tablet twice daily . If makes drowsy take 2 tablets - after he is off of work Patient taking differently: Take 0.2 mg by mouth See admin instructions. Take 1 tablet (0.2 mg) by mouth twice daily, if it makes you drowsy take 2 tablets (0.4 mg) after work 07/13/19   [name], NP  ferrous sulfate 325 (65 FE) MG EC tablet Take 1 tablet (325 mg total) by mouth 2 (two) times daily. 07/13/19 08/12/19  [name], NP  folic acid (FOLVITE) 1 MG tablet Take 1 tablet (1 mg total) by mouth daily. 01/25/19   Kathrene Alu, MD  furosemide (LASIX) 40 MG tablet Take 1 tablet (40 mg total) by mouth daily. 07/25/19 08/24/19  Ladona Horns, MD  sodium bicarbonate 650 MG tablet Take 2 tablets (1,300 mg total) by mouth 2 (two) times daily. 07/01/19   Ladona Horns, MD  vitamin B-12 (CYANOCOBALAMIN) 1000 MCG tablet Take 1 tablet (1,000 mcg total) by mouth daily. 07/13/19 08/12/19  [name], NP    Scheduled Meds: . pantoprazole  40 mg Oral Daily  . sodium bicarbonate  1,300 mg Oral BID  . sodium chloride flush  3 mL Intravenous Once   Infusions: . sodium chloride     PRN Meds:    Allergies as of 07/31/2019  . (No Known Allergies)    Family History  Problem Relation Age of Onset  . Colon cancer Neg Hx     Social History   Socioeconomic History  . Marital status:  Single    Spouse name: Not on file  . Number of children: Not on file  . Years of education: Not on file  . Highest education level: Not on file  Occupational History  . Not on file  Tobacco Use  . Smoking status: Current Every Day Smoker    Packs/day: 1.00    Years: 30.00    Pack years: 30.00    Types: Cigarettes  . Smokeless tobacco: Never Used  Substance and Sexual Activity  . Alcohol use: Yes    Alcohol/week: 12.0 standard drinks    Types: 12 Cans of beer per week  . Drug use: Yes    Types: "Crack" cocaine  . Sexual activity: Yes  Birth control/protection: None  Other Topics Concern  . Not on file  Social History Narrative  . Not on file   Social Determinants of Health   Financial Resource Strain:   . Difficulty of Paying Living Expenses:   Food Insecurity:   . Worried About Charity fundraiser in the Last Year:   . Arboriculturist in the Last Year:   Transportation Needs:   . Film/video editor (Medical):   [name] Lack of Transportation (Non-Medical):   Physical Activity:   . Days of Exercise per Week:   . Minutes of Exercise per Session:   Stress:   . Feeling of Stress :   Social Connections:   . Frequency of Communication with Friends and Family:   . Frequency of Social Gatherings with Friends and Family:   . Attends Religious Services:   . Active Member of Clubs or Organizations:   . Attends Archivist Meetings:   [name] Marital Status:   Intimate Partner Violence:   . Fear of Current or Ex-Partner:   . Emotionally Abused:   [name] Physically Abused:   . Sexually Abused:     REVIEW OF SYSTEMS: Constitutional: Not feeling dizzy today. ENT:  No nose bleeds Pulm: Not short of breath CV:  No palpitations, no LE edema.  GU:  No hematuria, no frequency GI: See HPI. Heme: Denies unusual or excessive bleeding or bruising. Transfusions: See HPI. Neuro:  No headaches, no peripheral tingling or numbness.  Near syncope but did not lose consciousness  yesterday. Derm:  No itching, no rash or sores.  Endocrine:  No sweats or chills.  No polyuria or dysuria Immunization: Not queried Travel:  None beyond local counties in last few months.    PHYSICAL EXAM: Vital signs in last 24 hours: Vitals:   08/01/19 0516 08/01/19 0955  BP: (!) 141/79 (!) 144/86  Pulse: 72 72  Resp: 18 (!) 24  Temp: 98.4 F (36.9 C) 97.9 F (36.6 C)  SpO2: 100% 100%   Wt Readings from Last 3 Encounters:  07/31/19 124.7 kg  07/25/19 120.1 kg  07/16/19 130 kg    General: Obese, comfortable looking.  Does not appear acutely or chronically ill. Head: No facial asymmetry or swelling.  No signs head trauma. Eyes: No scleral icterus.  No conjunctival pallor. Ears: Slightly hard of hearing Nose: No discharge Mouth: Moist, pink, clear mucosa.  Tongue midline Neck: No JVD, no masses, no thyromegaly. Lungs: Clear bilaterally.  No cough, no labored breathing. Heart: RRR.  No MRG.  S1, S2 present Abdomen: Soft, obese.  Not tender.  Not distended.  No HSM, masses, bruits, hernias.   Rectal: Deferred Musc/Skeltl: No joint redness, swelling or gross deformity. Extremities: Trace lower extremity pedal/ankle edema.. Neurologic: Oriented x3.  Fluid speech.  Moves all 4 limbs, strength not tested.  No tremors. Skin: No rash, no sores, no suspicious lesions. Nodes: No cervical adenopathy. Psych: Calm, cooperative, pleasant.  Intake/Output from previous day: 04/04 0701 - 04/05 0700 In: 1007 [P.O.:360; Blood:647] Out: 1100 [Urine:1100] Intake/Output this shift: Total I/O In: 240 [P.O.:240] Out: 300 [Urine:300]  LAB RESULTS: Recent Labs    07/31/19 1713 08/01/19 0702  WBC 4.3 4.3  HGB 7.1* 8.9*  HCT 21.2* 26.0*  PLT 114* 110*   BMET Lab Results  Component Value Date   NA 142 08/01/2019   NA 137 07/31/2019   NA 136 07/31/2019   K 3.3 (L) 08/01/2019   K 2.9 (L) 07/31/2019  K 3.1 (L) 07/31/2019   CL 105 08/01/2019   CL 104 07/31/2019   CL 102  07/31/2019   CO2 20 (L) 08/01/2019   CO2 18 (L) 07/31/2019   CO2 17 (L) 07/31/2019   GLUCOSE 118 (H) 08/01/2019   GLUCOSE 113 (H) 07/31/2019   GLUCOSE 190 (H) 07/31/2019   BUN 107 (H) 08/01/2019   BUN 105 (H) 07/31/2019   BUN 104 (H) 07/31/2019   CREATININE 8.30 (H) 08/01/2019   CREATININE 8.25 (H) 07/31/2019   CREATININE 8.06 (H) 07/31/2019   CALCIUM 8.1 (L) 08/01/2019   CALCIUM 7.7 (L) 07/31/2019   CALCIUM 7.7 (L) 07/31/2019   LFT Recent Labs    07/31/19 2033 08/01/19 0702  PROT  --  7.0  ALBUMIN 2.3* 2.2*  2.2*  AST  --  111*  ALT  --  44  ALKPHOS  --  58  BILITOT  --  0.6  BILIDIR  --  0.1  IBILI  --  0.5   PT/INR Lab Results  Component Value Date   INR 1.0 07/23/2019   INR 1.0 06/27/2019   INR 1.0 01/22/2019   Hepatitis Panel No results for input(s): HEPBSAG, HCVAB, HEPAIGM, HEPBIGM in the last 72 hours. C-Diff No components found for: CDIFF Lipase     Component Value Date/Time   LIPASE 84 (H) 07/16/2019 2336    Drugs of Abuse     Component Value Date/Time   LABOPIA NONE DETECTED 03/29/2019 1209   COCAINSCRNUR POSITIVE (A) 03/29/2019 1209   LABBENZ NONE DETECTED 03/29/2019 1209   AMPHETMU NONE DETECTED 03/29/2019 1209   THCU NONE DETECTED 03/29/2019 1209   LABBARB NONE DETECTED 03/29/2019 1209     RADIOLOGY STUDIES: No results found.    IMPRESSION:   *   Acute on chronic anemia.  Has required multiple transfusions over the last several months. Last week iron, TIBC, iron saturation, ferritin, Y70 normal.  Folic acid normal 10/19/7626 and 04/17/2019. 2 units transfused thus far this admission. Underwent EGD, colonoscopy, capsule endoscopy as related above in HPI.  *   Untreated hepatitis C, no evidence for cirrhosis on CT scan last month.  AST 111, albumin 2.2, otherwise normal LFTs.  *    Stage V CKD.  *    Hypokalemia.  *    Lack of healthcare insurance complicating follow-up with primary care as well as specialist  providers.    PLAN:     *  Per Dr Havery Moros.     Azucena Freed  08/01/2019, 12:29 PM Phone [phone]

## 2019-08-01 NOTE — Progress Notes (Signed)
Patient ID: Daniel [name], male   DOB: 10-May-1960, 59 y.o.   MRN: [id number]   Subjective:  Patient examined at bedside. He reports feeling well overall. Still having black stools. He notes ongoing dizziness and feeling like he is going to fall when he stands up. He denies any chest pain or shortness of breath. He denies any NSAID use. Discussed his ongoing renal failure and need for dialysis. Patient reports he was told that he did not need dialysis for another year. Has not been able to follow up with PCP due to insurance issues. Patient agreeable to start dialysis if needed.   Objective:  Vital signs in last 24 hours: Vitals:   07/31/19 2335 08/01/19 0200 08/01/19 0229 08/01/19 0516  BP: (!) 153/84 134/84 (!) 148/83 (!) 141/79  Pulse: 71 68 70 72  Resp: 19 18 18 18   Temp: 97.7 F (36.5 C) 97.8 F (36.6 C) 97.7 F (36.5 C) 98.4 F (36.9 C)  TempSrc: Oral Oral Oral Oral  SpO2: 99% 97% 98% 100%  Weight:      Height:       Weight change:   Intake/Output Summary (Last 24 hours) at 08/01/2019 0652 Last data filed at 08/01/2019 0516 Gross per 24 hour  Intake 887 ml  Output 1100 ml  Net -213 ml   Physical Exam Cardiovascular:     Rate and Rhythm: Normal rate and regular rhythm.     Pulses: Normal pulses.     Heart sounds: Murmur present. Systolic murmur present with a grade of 1/6.     Comments: Trace edema bilateral lower extremities up to ~2 inches above ankle Pulmonary:     Effort: Pulmonary effort is normal.     Breath sounds: Normal breath sounds.  Abdominal:     General: Bowel sounds are normal.     Palpations: Abdomen is soft.     Tenderness: There is no abdominal tenderness.  Neurological:     Mental Status: He is alert.    CBC Latest Ref Rng & Units 08/01/2019 07/31/2019 07/25/2019  WBC 4.0 - 10.5 K/uL 4.3 4.3 4.1  Hemoglobin 13.0 - 17.0 g/dL 8.9(L) 7.1(L) 8.0(L)  Hematocrit 39.0 - 52.0 % 26.0(L) 21.2(L) 23.7(L)  Platelets 150 - 400 K/uL 110(L) 114(L) 133(L)    CMP Latest Ref Rng & Units 08/01/2019 07/31/2019 07/31/2019  Glucose 70 - 99 mg/dL 118(H) 113(H) 190(H)  BUN 6 - 20 mg/dL 107(H) 105(H) 104(H)  Creatinine 0.61 - 1.24 mg/dL 8.30(H) 8.25(H) 8.06(H)  Sodium 135 - 145 mmol/L 142 137 136  Potassium 3.5 - 5.1 mmol/L 3.3(L) 2.9(L) 3.1(L)  Chloride 98 - 111 mmol/L 105 104 102  CO2 22 - 32 mmol/L 20(L) 18(L) 17(L)  Calcium 8.9 - 10.3 mg/dL 8.1(L) 7.7(L) 7.7(L)  Total Protein 6.5 - 8.1 g/dL 7.0 - -  Total Bilirubin 0.3 - 1.2 mg/dL 0.6 - -  Alkaline Phos 38 - 126 U/L 58 - -  AST 15 - 41 U/L 111(H) - -  ALT 0 - 44 U/L 44 - -    Ref Range & Units 07:02 1 d ago 1 d ago 7 d ago  Sodium 135 - 145 mmol/L 142  137  136  142   Potassium 3.5 - 5.1 mmol/L 3.3Low   2.9Low   3.1Low   3.2Low    Chloride 98 - 111 mmol/L 105  104  102  108   CO2 22 - 32 mmol/L 20Low   18Low   17Low   21Low  Glucose, Bld 70 - 99 mg/dL 118High   113High  CM  190High  CM  135High  CM   Comment: Glucose reference range applies only to samples taken after fasting for at least 8 hours.  BUN 6 - 20 mg/dL 107High   105High   104High   76High    Creatinine, Ser 0.61 - 1.24 mg/dL 8.30High   8.25High   8.06High   7.58High    Calcium 8.9 - 10.3 mg/dL 8.1Low   7.7Low   7.7Low   8.0Low    Phosphorus 2.5 - 4.6 mg/dL 7.2High   4.5     Albumin 3.5 - 5.0 g/dL 2.2Low   2.3Low      GFR calc non Af Amer >60 mL/min 6Low   6Low   7Low   7Low    GFR calc Af Amer >60 mL/min 7Low   7Low   8Low   8Low    Anion gap 5 - 15 17High   15 CM  17High  CM  13 CM    Urinalysis    Component Value Date/Time   COLORURINE YELLOW 07/31/2019 2128   [place] 07/31/2019 2128   LABSPEC 1.009 07/31/2019 2128   PHURINE 5.0 07/31/2019 2128   GLUCOSEU NEGATIVE 07/31/2019 2128   HGBUR SMALL (A) 07/31/2019 2128   [place] NEGATIVE 07/31/2019 2128   [place] NEGATIVE 07/31/2019 2128   PROTEINUR 100 (A) 07/31/2019 2128   UROBILINOGEN 0.2 12/31/2009 0746   NITRITE NEGATIVE 07/31/2019 2128    LEUKOCYTESUR NEGATIVE 07/31/2019 2128   Assessment/Plan:  Principal Problem:   Symptomatic anemia Active Problems:   Hypertension   DM2 (diabetes mellitus, type 2) (HCC)   Orthostatic hypotension   CKD (chronic kidney disease), stage V (HCC)   Acute on chronic anemia  Summary:  Daniel [name] is a 59 year old male with a history of CKD stage 5, type II DM, HTN, and anemia of chronic disease who presented with dizziness and admitted for symptomatic anemia due to GI bleed v chronic renal failure.   Symptomatic anemia, orthostatic hypotension:  On admission Hgb was 7.1 and received 2 U pRBC while in the ED and Hgb was 8.9 this morning. The patient continues to endorse melanotic stools and orthostatic dizziness, which is concerning for ongoing GI bleed. He has had an upper EGD, capsular EGD, and colonoscopy over the course of the last month none of which identified source of bleeding. He has been unable to follow up with outpatient GI clinic due to lack of insurance.   - GI consulted; we greatly appreciate recommendations - monitor daily CBC - continue protonix  - EPO level pending - continue 0.9% sodium chloride infusion   CKD stage V:  Patient's creatinine is 8.3 this morning and has climbed slowly since the beginning of this month when it was upper 6 to lower 7. Anion gap of 17 today with BUN 107, up from 104 yesterday. As of December 2020 his creatinine was high 2, and his baseline 2019-2020 appears to have been 2-3. His kidney function has dropped significantly over this period of 3-4 months with no known inciting event. The patient denies NSAID use, which could have explained concurrent GI bleeding and worsening kidney function. His diabetes is well controlled with most recent HgbA1c at 5.2 and HTN has been well controlled as outpatient as well.  At this time etiology of progression of kidney disease is not known.   He has been unable to follow with outpatient nephrology due to lack  of  insurance.   - nephrology consulted; will see patient this afternoon; we appreciate all recommendations.  - Pending labs: EPO level, PTH, protein creatinine ratio - avoid nephrotoxic medications - had low K+, received 20 mEq potassium chloride this AM - continue 1300 mg sodium bicarbonate BID  HTN:  On clonidine and lasix at home. Holding antihypertensives for now given ongoing dizziness and falls.    Diet: renal w/ fluid restriction 1200 mL VTE ppx: SCDs, no lovenox due to GI bleed Code: full Dispo: to be determined     LOS: 0 days   Mikael Spray, Medical Student 08/01/2019, 6:52 AM

## 2019-08-01 NOTE — Plan of Care (Signed)
  Problem: Activity: Goal: Risk for activity intolerance will decrease Outcome: Progressing   

## 2019-08-01 NOTE — Consult Note (Addendum)
[facility] Renal Consultation Note  Requesting MD:  Indication for Consultation: Acute on chronic kidney disease, maintenance of euvolemia, treatment and evaluation of electrolyte abnormalities, treatment and evaluation of acid-base disorders.  HPI:  Daniel [name] is a 59 y.o. male.  History of chronic kidney disease stage V, diabetes mellitus type 2, anemia chronic disease, history of hepatitis C and hypertension.  He was evaluated for progression of his renal disease by Dr. Augustin Coupe and and was meant to have follow-up with [facility].  Serological evaluation has been unremarkable.  He was admitted with symptomatic anemia 07/23/2019-07/25/2019 and transfused 2 units of packed red blood cells for hemoglobin of 5.9.  Endoscopic evaluation was unremarkable but did reveal duodenal polyp, colonoscopy was also performed and he underwent capsule endoscopy revealed no AVMs ulceration or diverticular disease.  In the emergency room he is found to have BUN of 104 creatinine 08.06 orthostatic vitals were performed with a systolic blood pressure that dropped from 150 mmHg to 109 mmHg.  Was typed and screened for 2 units packed red blood cells and gastroenterology was consulted.  Blood pressure 144/86 pulse 72 temperature 97.9 O2 sats 100% room air  Sodium 142 potassium 3.3 chloride 105 CO2 20 BUN 107 creatinine 8.3 glucose 118 calcium 8.1 phosphorus 7.2 albumin 2.2 AST 111.  Liver enzymes within normal range T sats 22%.    Urinalysis 100 mg/dL protein 0-5 WBCs 0-5 RBCs.  CT scan of head showed no acute intracranial abnormality Ultrasound of abdomen cholelithiasis with associated gallbladder wall thickening dilation of the common bile duct 8.7 mm CT scan of abdomen 07/17/2019 showed normal adrenal glands nonobstructing calculus lower pole left kidney no obstructive urolithiasis no hydronephrosis no suspicious renal lesions.  Protonix 40 mg daily sodium bicarbonate 1.3 g twice  daily  Creatinine, Ser  Date/Time Value Ref Range Status  08/01/2019 07:02 AM 8.30 (H) 0.61 - 1.24 mg/dL Final  07/31/2019 08:33 PM 8.25 (H) 0.61 - 1.24 mg/dL Final  07/31/2019 05:13 PM 8.06 (H) 0.61 - 1.24 mg/dL Final  07/25/2019 04:33 AM 7.58 (H) 0.61 - 1.24 mg/dL Final  07/24/2019 06:18 AM 7.71 (H) 0.61 - 1.24 mg/dL Final  07/23/2019 03:55 PM 7.84 (H) 0.61 - 1.24 mg/dL Final  07/16/2019 11:36 PM 6.72 (H) 0.61 - 1.24 mg/dL Final  07/07/2019 03:18 AM 6.94 (H) 0.61 - 1.24 mg/dL Final  07/06/2019 04:18 PM 7.24 (H) 0.61 - 1.24 mg/dL Final  07/01/2019 08:08 AM 6.41 (H) 0.61 - 1.24 mg/dL Final  06/30/2019 03:48 AM 6.64 (H) 0.61 - 1.24 mg/dL Final  06/29/2019 07:39 AM 7.06 (H) 0.61 - 1.24 mg/dL Final  06/27/2019 11:57 PM 6.79 (H) 0.61 - 1.24 mg/dL Final  06/27/2019 08:27 PM 6.76 (H) 0.61 - 1.24 mg/dL Final  06/27/2019 11:15 AM 7.02 (H) 0.61 - 1.24 mg/dL Final  03/31/2019 08:02 AM 2.85 (H) 0.61 - 1.24 mg/dL Final  03/30/2019 04:36 AM 2.73 (H) 0.61 - 1.24 mg/dL Final  03/29/2019 06:20 AM 2.80 (H) 0.61 - 1.24 mg/dL Final  01/27/2019 01:01 PM 3.29 (H) 0.61 - 1.24 mg/dL Final  01/24/2019 07:04 AM 2.69 (H) 0.61 - 1.24 mg/dL Final  01/23/2019 05:44 AM 3.25 (H) 0.61 - 1.24 mg/dL Final  01/22/2019 02:41 PM 4.08 (H) 0.61 - 1.24 mg/dL Final  10/21/2018 12:48 PM 2.54 (H) 0.61 - 1.24 mg/dL Final  09/20/2018 09:29 AM 2.39 (H) 0.61 - 1.24 mg/dL Final  04/06/2018 12:30 PM 1.98 (H) 0.61 - 1.24 mg/dL Final  05/27/2017 03:29 PM 1.62 (H) 0.61 -  1.24 mg/dL Final  05/25/2017 06:15 AM 1.41 (H) 0.61 - 1.24 mg/dL Final  05/24/2017 02:41 AM 1.37 (H) 0.61 - 1.24 mg/dL Final  05/23/2017 03:05 AM 1.40 (H) 0.61 - 1.24 mg/dL Final  05/22/2017 02:53 AM 1.59 (H) 0.61 - 1.24 mg/dL Final  05/20/2017 09:34 PM 1.49 (H) 0.61 - 1.24 mg/dL Final  07/01/2016 06:59 AM 1.20 0.61 - 1.24 mg/dL Final  06/30/2016 05:07 AM 1.26 (H) 0.61 - 1.24 mg/dL Final  06/29/2016 11:31 AM 1.13 0.61 - 1.24 mg/dL Final  06/29/2016 01:52 AM 1.24  0.61 - 1.24 mg/dL Final  06/28/2013 09:18 AM 1.18 0.50 - 1.35 mg/dL Final  05/17/2010 11:34 AM 0.87 0.4 - 1.5 mg/dL Final  12/31/2009 08:00 AM 0.9 0.4 - 1.5 mg/dL Final     PMHx:   Past Medical History:  Diagnosis Date  . Diabetes mellitus without complication ([place])   . Hypertension   . ICH (intracerebral hemorrhage) ([place]) 05/20/2017  . Shoulder pain, left 06/28/2013    Past Surgical History:  Procedure Laterality Date  . APPENDECTOMY    . BIOPSY  06/30/2019   Procedure: BIOPSY;  Surgeon: Ronald Lobo, MD;  Location: [place];  Service: Endoscopy;;  . COLONOSCOPY  01/23/2012   Procedure: COLONOSCOPY;  Surgeon: Danie Binder, MD;  [facility];  Service: Endoscopy;  Laterality: N/A;  11:10 AM  . COLONOSCOPY WITH PROPOFOL N/A 06/30/2019   Procedure: COLONOSCOPY WITH PROPOFOL;  Surgeon: Ronald Lobo, MD;  Location: [facility];  Service: Endoscopy;  Laterality: N/A;  . ESOPHAGOGASTRODUODENOSCOPY (EGD) WITH PROPOFOL N/A 06/30/2019   Procedure: ESOPHAGOGASTRODUODENOSCOPY (EGD) WITH PROPOFOL;  Surgeon: Ronald Lobo, MD;  Location: [place];  Service: Endoscopy;  Laterality: N/A;  . GIVENS CAPSULE STUDY N/A 06/30/2019   Procedure: GIVENS CAPSULE STUDY;  Surgeon: Ronald Lobo, MD;  Location: [place];  Service: Endoscopy;  Laterality: N/A;  . INCISION AND DRAINAGE ABSCESS N/A 06/29/2016   Procedure: INCISION AND DRAINAGE ABDOMINAL WALL ABSCESS;  Surgeon: Alphonsa Overall, MD;  Location: [facility];  Service: General;  Laterality: N/A;  . Left heel surgery      Family Hx:  Family History  Problem Relation Age of Onset  . Colon cancer Neg Hx     Social History:  reports that he has been smoking cigarettes. He has a 30.00 pack-year smoking history. He has never used smokeless tobacco. He reports current alcohol use of about 12.0 standard drinks of alcohol per week. He reports current drug use. Drug: "Crack" cocaine.  Allergies: No Known Allergies  Medications: Prior  to Admission medications   Medication Sig Start Date End Date Taking? Authorizing Provider  amLODipine (NORVASC) 10 MG tablet Take 1 tablet (10 mg total) by mouth daily. 07/13/19 07/12/20 Yes [name], NP  cloNIDine (CATAPRES) 0.2 MG tablet Take  1 tablet twice daily . If makes drowsy take 2 tablets - after he is off of work Patient taking differently: Take 0.2 mg by mouth See admin instructions. Take 1 tablet (0.2 mg) by mouth twice daily, if it makes you drowsy take 2 tablets (0.4 mg) after work 07/13/19  Yes [name], NP  ferrous sulfate 325 (65 FE) MG EC tablet Take 1 tablet (325 mg total) by mouth 2 (two) times daily. 07/13/19 08/12/19 Yes [name], NP  furosemide (LASIX) 40 MG tablet Take 1 tablet (40 mg total) by mouth daily. 07/25/19 08/24/19 Yes Ladona Horns, MD  gabapentin (NEURONTIN) 300 MG capsule Take 300 mg by mouth See admin instructions.  Take 321m twice daily and 6034mat bedtime.   Yes [provider]  hydrochlorothiazide (HYDRODIURIL) 25 MG tablet Take 25 mg by mouth daily.   Yes [provider]  sodium bicarbonate 650 MG tablet Take 2 tablets (1,300 mg total) by mouth 2 (two) times daily. 07/01/19  Yes JoLadona HornsMD  vitamin B-12 (CYANOCOBALAMIN) 1000 MCG tablet Take 1 tablet (1,000 mcg total) by mouth daily. 07/13/19 08/12/19 Yes EdKerin PernaNP  blood glucose meter kit and supplies KIT Dispense based on patient and insurance preference. Use up to four times daily as directed. (FOR ICD-9 250.00, 250.01). 07/01/16   SaNita SellsMD     Labs:  Results for orders placed or performed during the hospital encounter of 07/31/19 (from the past 48 hour(s))  CBG monitoring, ED     Status: Abnormal   Collection Time: 07/31/19  5:03 PM  Result Value Ref Range   Glucose-Capillary 178 (H) 70 - 99 mg/dL    Comment: Glucose reference range applies only to samples taken after fasting for at least 8 hours.  Basic metabolic panel      Status: Abnormal   Collection Time: 07/31/19  5:13 PM  Result Value Ref Range   Sodium 136 135 - 145 mmol/L   Potassium 3.1 (L) 3.5 - 5.1 mmol/L   Chloride 102 98 - 111 mmol/L   CO2 17 (L) 22 - 32 mmol/L   Glucose, Bld 190 (H) 70 - 99 mg/dL    Comment: Glucose reference range applies only to samples taken after fasting for at least 8 hours.   BUN 104 (H) 6 - 20 mg/dL   Creatinine, Ser 8.06 (H) 0.61 - 1.24 mg/dL   Calcium 7.7 (L) 8.9 - 10.3 mg/dL   GFR calc non Af Amer 7 (L) >60 mL/min   GFR calc Af Amer 8 (L) >60 mL/min   Anion gap 17 (H) 5 - 15    Comment: Performed at [facility] [place] [id number]     Status: Abnormal   Collection Time: 07/31/19  5:13 PM  Result Value Ref Range   WBC 4.3 4.0 - 10.5 K/uL   RBC 2.06 (L) 4.22 - 5.81 MIL/uL   Hemoglobin 7.1 (L) 13.0 - 17.0 g/dL   HCT 21.2 (L) 39.0 - 52.0 %   MCV 102.9 (H) 80.0 - 100.0 fL   MCH 34.5 (H) 26.0 - 34.0 pg   MCHC 33.5 30.0 - 36.0 g/dL   RDW 19.4 (H) 11.5 - 15.5 %   Platelets 114 (L) 150 - 400 K/uL    Comment: REPEATED TO VERIFY PLATELET COUNT CONFIRMED BY SMEAR SPECIMEN CHECKED FOR CLOTS Immature Platelet Fraction may be clinically indicated, consider ordering this additional test [id number]  nRBC 0.0 0.0 - 0.2 %    Comment: Performed at [facility] [address] [place] [id number] RBC (crossmatch)     Status: None   Collection Time: 07/31/19  8:15 PM  Result Value Ref Range   Order Confirmation      ORDER PROCESSED BY BLOOD BANK Performed at [facility] [place] [id number] Renal function panel     Status: Abnormal   Collection Time: 07/31/19  8:33 PM  Result Value Ref Range   Sodium 137 135 - 145 mmol/L   Potassium 2.9 (L) 3.5 - 5.1 mmol/L   Chloride 104 98 -  111 mmol/L   CO2 18 (L) 22 - 32 mmol/L   Glucose, Bld 113 (H) 70 - 99 mg/dL    Comment: Glucose reference range applies only to samples taken after  fasting for at least 8 hours.   BUN 105 (H) 6 - 20 mg/dL   Creatinine, Ser 8.25 (H) 0.61 - 1.24 mg/dL   Calcium 7.7 (L) 8.9 - 10.3 mg/dL   Phosphorus 4.5 2.5 - 4.6 mg/dL   Albumin 2.3 (L) 3.5 - 5.0 g/dL   GFR calc non Af Amer 6 (L) >60 mL/min   GFR calc Af Amer 7 (L) >60 mL/min   Anion gap 15 5 - 15    Comment: Performed at [facility] [address] [place], [place] [address]  Type and screen     Status: None (Preliminary result)   Collection Time: 07/31/19  8:40 PM  Result Value Ref Range   ABO/RH(D) B POS    Antibody Screen NEG    Sample Expiration 08/03/2019,2359    Unit Number [id number]    Blood Component Type RBC LR PHER1    Unit division 00    Status of Unit ISSUED    Transfusion Status OK TO TRANSFUSE    Crossmatch Result Compatible    Unit Number [id number]    Blood Component Type RED CELLS,LR    Unit division 00    Status of Unit ISSUED,FINAL    Transfusion Status OK TO TRANSFUSE    Crossmatch Result      Compatible Performed at [facility], [place] [address] [place], [place] [address]   Urinalysis, Routine w reflex microscopic     Status: Abnormal   Collection Time: 07/31/19  9:28 PM  Result Value Ref Range   Color, Urine YELLOW YELLOW   APPearance CLEAR CLEAR   Specific Gravity, Urine 1.009 1.005 - 1.030   pH 5.0 5.0 - 8.0   Glucose, UA NEGATIVE NEGATIVE mg/dL   Hgb urine dipstick SMALL (A) NEGATIVE   Bilirubin Urine NEGATIVE NEGATIVE   Ketones, ur NEGATIVE NEGATIVE mg/dL   Protein, ur 100 (A) NEGATIVE mg/dL   Nitrite NEGATIVE NEGATIVE   Leukocytes,Ua NEGATIVE NEGATIVE   WBC, UA 0-5 0 - 5 WBC/hpf   Bacteria, UA NONE SEEN NONE SEEN    Comment: Performed at [facility] [address], [place], [place] [address]  Respiratory Panel by RT PCR (Flu A&B, Covid) - Nasopharyngeal Swab     Status: None   Collection Time: 07/31/19 10:48 PM   Specimen: Nasopharyngeal Swab  Result Value Ref Range   SARS Coronavirus 2 by RT PCR NEGATIVE  NEGATIVE    Comment: (NOTE) SARS-CoV-2 target nucleic acids are NOT DETECTED. The SARS-CoV-2 RNA is generally detectable in upper respiratoy specimens during the acute phase of infection. The lowest concentration of SARS-CoV-2 viral copies this assay can detect is 131 copies/mL. A negative result does not preclude SARS-Cov-2 infection and should not be used as the sole basis for treatment or other patient management decisions. A negative result may occur with  improper specimen collection/handling, submission of specimen other than nasopharyngeal swab, presence of viral mutation(s) within the areas targeted by this assay, and inadequate number of viral copies (<131 copies/mL). A negative result must be combined with clinical observations, patient history, and epidemiological information. The expected result is Negative. Fact Sheet for Patients:  PinkCheek.be Fact Sheet for Healthcare Providers:  GravelBags.it This test is not yet ap proved or cleared by the [place]  States FDA and  has been authorized for detection and/or diagnosis of SARS-CoV-2 by FDA under an Emergency Use Authorization (EUA). This EUA will remain  in effect (meaning this test can be used) for the duration of the COVID-19 declaration under Section 564(b)(1) of the Act, 21 U.S.C. section 360bbb-3(b)(1), unless the authorization is terminated or revoked sooner.    Influenza A by PCR NEGATIVE NEGATIVE   Influenza B by PCR NEGATIVE NEGATIVE    Comment: (NOTE) The Xpert Xpress SARS-CoV-2/FLU/RSV assay is intended as an aid in  the diagnosis of influenza from Nasopharyngeal swab specimens and  should not be used as a sole basis for treatment. Nasal washings and  aspirates are unacceptable for Xpert Xpress SARS-CoV-2/FLU/RSV  testing. Fact Sheet for Patients: PinkCheek.be Fact Sheet for Healthcare  Providers: GravelBags.it This test is not yet approved or cleared by the [place] FDA and  has been authorized for detection and/or diagnosis of SARS-CoV-2 by  FDA under an Emergency Use Authorization (EUA). This EUA will remain  in effect (meaning this test can be used) for the duration of the  Covid-19 declaration under Section 564(b)(1) of the Act, 21  U.S.C. section 360bbb-3(b)(1), unless the authorization is  terminated or revoked. Performed at [facility], [place] [address] [place], [place] [address]   CBC     Status: Abnormal   Collection Time: 08/01/19  7:02 AM  Result Value Ref Range   WBC 4.3 4.0 - 10.5 K/uL   RBC 2.66 (L) 4.22 - 5.81 MIL/uL   Hemoglobin 8.9 (L) 13.0 - 17.0 g/dL    Comment: REPEATED TO VERIFY POST TRANSFUSION SPECIMEN    HCT 26.0 (L) 39.0 - 52.0 %   MCV 97.7 80.0 - 100.0 fL   MCH 33.5 26.0 - 34.0 pg   MCHC 34.2 30.0 - 36.0 g/dL   RDW 19.0 (H) 11.5 - 15.5 %   Platelets 110 (L) 150 - 400 K/uL    Comment: REPEATED TO VERIFY Immature Platelet Fraction may be clinically indicated, consider ordering this additional test [id number] CONSISTENT WITH PREVIOUS RESULT    nRBC 0.5 (H) 0.0 - 0.2 %    Comment: Performed at [facility], [place] [address], [place], [place] [address]  Hepatic function panel     Status: Abnormal   Collection Time: 08/01/19  7:02 AM  Result Value Ref Range   Total Protein 7.0 6.5 - 8.1 g/dL   Albumin 2.2 (L) 3.5 - 5.0 g/dL   AST 111 (H) 15 - 41 U/L   ALT 44 0 - 44 U/L   Alkaline Phosphatase 58 38 - 126 U/L   Total Bilirubin 0.6 0.3 - 1.2 mg/dL   Bilirubin, Direct 0.1 0.0 - 0.2 mg/dL   Indirect Bilirubin 0.5 0.3 - 0.9 mg/dL    Comment: Performed at [facility] [address] [place], [place] [address]  Renal function panel     Status: Abnormal   Collection Time: 08/01/19  7:02 AM  Result Value Ref Range   Sodium 142 135 - 145 mmol/L   Potassium 3.3 (L) 3.5 - 5.1 mmol/L    Chloride 105 98 - 111 mmol/L   CO2 20 (L) 22 - 32 mmol/L   Glucose, Bld 118 (H) 70 - 99 mg/dL    Comment: Glucose reference range applies only to samples taken after fasting for at least 8 hours.   BUN 107 (H) 6 - 20 mg/dL   Creatinine, Ser 8.30 (H) 0.61 -  1.24 mg/dL   Calcium 8.1 (L) 8.9 - 10.3 mg/dL   Phosphorus 7.2 (H) 2.5 - 4.6 mg/dL   Albumin 2.2 (L) 3.5 - 5.0 g/dL   GFR calc non Af Amer 6 (L) >60 mL/min   GFR calc Af Amer 7 (L) >60 mL/min   Anion gap 17 (H) 5 - 15    Comment: Performed at [facility] [address], [place], [place] [address]     ROS: General denies fatigue fever sweats chills Eyes no visual complaints blurred vision double vision Ears nose mouth throat no hearing loss epistaxis no sore throat Cardiovascular no anginal chest pain orthopnea PND palpitations Respiratory no cough wheeze emesis Abdominal system no abdominal pain nausea vomiting.  GI blood loss as per GI Urogenital no urgency frequency dysuria Dermatologic no skin rash or itching Endocrine  diabetes no thyroid or adrenal disease Neurologic no stroke seizures    Physical Exam: Vitals:   08/01/19 0516 08/01/19 0955  BP: (!) 141/79 (!) 144/86  Pulse: 72 72  Resp: 18 (!) 24  Temp: 98.4 F (36.9 C) 97.9 F (36.6 C)  SpO2: 100% 100%     General: Alert oriented nondistressed HEENT: Normocephalic atraumatic Eyes: Pupils round equal reactive no icterus or pallor Neck: Supple no thyromegaly adenopathy no JVP Heart: Regular rate and rhythm no murmurs rubs gallops Lungs: Clear to auscultation occasional wheezes Abdomen: Soft nontender bowel sounds present Extremities: No cyanosis clubbing or edema Skin: No skin lesions noted Neuro: Grossly intact  Assessment/Plan: 1.Progressive renal insufficiency and active appearing urine sediment.  Fairly remarkable progression in the past 18 months.  Did have chronic kidney disease at baseline.  He is very inactive urine sediment and no evidence  of obstruction on CT scan.  Serological evaluation has been unrevealing.  It was considered that a renal biopsy should be performed although I do not see that this is going to provide any diagnostic or therapeutic benefit.  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  We will obtain vein mapping and consult with vein and vascular surgery 2. Hypertension/volume  -appears adequately controlled at this time.  Antihypertensives have been put on hold secondary to GI bleed.  He also has symptomatic orthostasis. 3.  GI blood loss with Feraheme administered in the past.  Most recently he has a history of transfusions in December 2020 and twice in March 2020.  Thorough GI evaluation with mild gallbladder wall thickening pericholecystic stranding 4.  History of hepatitis C untreated can 3.7 million 12/2018 5.  Diabetes as per primary service   vein and vascular surgery consult called 08/01/2019.  Will need dialysis cath and AV fistula for initiation of dialysis.  Have discussed with patient patient is agreeable to proceed with dialysis   Sherril Croon 08/01/2019, 1:43 PM

## 2019-08-02 ENCOUNTER — Inpatient Hospital Stay (HOSPITAL_COMMUNITY): Payer: Self-pay | Admitting: Anesthesiology

## 2019-08-02 ENCOUNTER — Encounter (HOSPITAL_COMMUNITY)
Admission: EM | Disposition: A | Discharge status: Home / Self Care | Payer: Self-pay | Source: Home / Self Care | Attending: Internal Medicine

## 2019-08-02 ENCOUNTER — Inpatient Hospital Stay (HOSPITAL_COMMUNITY): Payer: Self-pay

## 2019-08-02 ENCOUNTER — Encounter (HOSPITAL_COMMUNITY): Payer: Self-pay | Admitting: Internal Medicine

## 2019-08-02 DIAGNOSIS — K317 Polyp of stomach and duodenum: Secondary | ICD-10-CM

## 2019-08-02 DIAGNOSIS — K5520 Angiodysplasia of colon without hemorrhage: Secondary | ICD-10-CM

## 2019-08-02 DIAGNOSIS — K5521 Angiodysplasia of colon with hemorrhage: Secondary | ICD-10-CM

## 2019-08-02 DIAGNOSIS — K3189 Other diseases of stomach and duodenum: Secondary | ICD-10-CM

## 2019-08-02 DIAGNOSIS — K31819 Angiodysplasia of stomach and duodenum without bleeding: Secondary | ICD-10-CM

## 2019-08-02 DIAGNOSIS — K922 Gastrointestinal hemorrhage, unspecified: Secondary | ICD-10-CM

## 2019-08-02 DIAGNOSIS — N186 End stage renal disease: Secondary | ICD-10-CM

## 2019-08-02 HISTORY — PX: BIOPSY: SHX5522

## 2019-08-02 HISTORY — PX: ENTEROSCOPY: SHX5533

## 2019-08-02 HISTORY — PX: HEMOSTASIS CONTROL: SHX6838

## 2019-08-02 LAB — BPAM RBC
Blood Product Expiration Date: 202104302359
Blood Product Expiration Date: 202105042359
ISSUE DATE / TIME: 202104042209
ISSUE DATE / TIME: 202104050208
Unit Type and Rh: 7300
Unit Type and Rh: 7300

## 2019-08-02 LAB — RENAL FUNCTION PANEL
Albumin: 2.2 g/dL — ABNORMAL LOW (ref 3.5–5.0)
Anion gap: 15 (ref 5–15)
BUN: 111 mg/dL — ABNORMAL HIGH (ref 6–20)
CO2: 19 mmol/L — ABNORMAL LOW (ref 22–32)
Calcium: 8.3 mg/dL — ABNORMAL LOW (ref 8.9–10.3)
Chloride: 108 mmol/L (ref 98–111)
Creatinine, Ser: 8.22 mg/dL — ABNORMAL HIGH (ref 0.61–1.24)
GFR calc Af Amer: 7 mL/min — ABNORMAL LOW (ref >60)
GFR calc non Af Amer: 6 mL/min — ABNORMAL LOW (ref >60)
Glucose, Bld: 111 mg/dL — ABNORMAL HIGH (ref 70–99)
Phosphorus: 6.4 mg/dL — ABNORMAL HIGH (ref 2.5–4.6)
Potassium: 3.2 mmol/L — ABNORMAL LOW (ref 3.5–5.1)
Sodium: 142 mmol/L (ref 135–145)

## 2019-08-02 LAB — CBC
HCT: 27.5 % — ABNORMAL LOW (ref 39.0–52.0)
Hemoglobin: 9 g/dL — ABNORMAL LOW (ref 13.0–17.0)
MCH: 32.5 pg (ref 26.0–34.0)
MCHC: 32.7 g/dL (ref 30.0–36.0)
MCV: 99.3 fL (ref 80.0–100.0)
Platelets: 98 10*3/uL — ABNORMAL LOW (ref 150–400)
RBC: 2.77 MIL/uL — ABNORMAL LOW (ref 4.22–5.81)
RDW: 20.2 % — ABNORMAL HIGH (ref 11.5–15.5)
WBC: 4.2 10*3/uL (ref 4.0–10.5)
nRBC: 0 % (ref 0.0–0.2)

## 2019-08-02 LAB — TYPE AND SCREEN
ABO/RH(D): B POS
Antibody Screen: NEGATIVE
Unit division: 0
Unit division: 0

## 2019-08-02 LAB — PTH, INTACT AND CALCIUM
Calcium, Total (PTH): 7.9 mg/dL — ABNORMAL LOW (ref 8.7–10.2)
PTH: 217 pg/mL — ABNORMAL HIGH (ref 15–65)

## 2019-08-02 LAB — SURGICAL PCR SCREEN
MRSA, PCR: NEGATIVE
Staphylococcus aureus: NEGATIVE

## 2019-08-02 LAB — ERYTHROPOIETIN: Erythropoietin: 14.3 m[IU]/mL (ref 2.6–18.5)

## 2019-08-02 SURGERY — ENTEROSCOPY
Anesthesia: Monitor Anesthesia Care

## 2019-08-02 MED ORDER — SODIUM CHLORIDE 0.9 % IV SOLN
1.5000 g | INTRAVENOUS | Status: AC
  Administered 2019-08-03: 1.5 g via INTRAVENOUS
  Filled 2019-08-02 (×2): qty 1.5

## 2019-08-02 MED ORDER — SODIUM CHLORIDE 0.9 % IV SOLN
INTRAVENOUS | Status: AC | PRN
  Administered 2019-08-02: 500 mL via INTRAVENOUS

## 2019-08-02 MED ORDER — SPOT INK MARKER SYRINGE KIT
PACK | SUBMUCOSAL | Status: AC
  Filled 2019-08-02: qty 5

## 2019-08-02 MED ORDER — CHLORHEXIDINE GLUCONATE CLOTH 2 % EX PADS
6.0000 | MEDICATED_PAD | Freq: Every day | CUTANEOUS | Status: DC
  Administered 2019-08-03 – 2019-08-06 (×4): 6 via TOPICAL

## 2019-08-02 MED ORDER — SODIUM CHLORIDE 0.9 % IV SOLN
250.0000 mg | Freq: Every day | INTRAVENOUS | Status: AC
  Administered 2019-08-02 – 2019-08-03 (×2): 250 mg via INTRAVENOUS
  Filled 2019-08-02 (×2): qty 20

## 2019-08-02 MED ORDER — PROPOFOL 10 MG/ML IV BOLUS
INTRAVENOUS | Status: DC | PRN
  Administered 2019-08-02: 20 mg via INTRAVENOUS
  Administered 2019-08-02 (×2): 30 mg via INTRAVENOUS

## 2019-08-02 MED ORDER — BUTAMBEN-TETRACAINE-BENZOCAINE 2-2-14 % EX AERO
INHALATION_SPRAY | CUTANEOUS | Status: DC | PRN
  Administered 2019-08-02: 10:00:00 2 via TOPICAL

## 2019-08-02 MED ORDER — POTASSIUM CHLORIDE CRYS ER 20 MEQ PO TBCR
20.0000 meq | EXTENDED_RELEASE_TABLET | Freq: Once | ORAL | Status: AC
  Administered 2019-08-02: 20 meq via ORAL
  Filled 2019-08-02: qty 1

## 2019-08-02 MED ORDER — GLYCOPYRROLATE 0.2 MG/ML IJ SOLN
INTRAMUSCULAR | Status: DC | PRN
  Administered 2019-08-02: .1 mg via INTRAVENOUS

## 2019-08-02 MED ORDER — PHENYLEPHRINE 40 MCG/ML (10ML) SYRINGE FOR IV PUSH (FOR BLOOD PRESSURE SUPPORT)
PREFILLED_SYRINGE | INTRAVENOUS | Status: DC | PRN
  Administered 2019-08-02 (×2): 80 ug via INTRAVENOUS

## 2019-08-02 MED ORDER — GLUCAGON HCL RDNA (DIAGNOSTIC) 1 MG IJ SOLR
INTRAMUSCULAR | Status: AC
  Filled 2019-08-02: qty 1

## 2019-08-02 MED ORDER — DARBEPOETIN ALFA 60 MCG/0.3ML IJ SOSY: 60.0000 ug | PREFILLED_SYRINGE | INTRAMUSCULAR | Status: DC

## 2019-08-02 MED ORDER — LACTATED RINGERS IV BOLUS: 1000.0000 mL | Freq: Once | INTRAVENOUS | Status: DC

## 2019-08-02 MED ORDER — PROPOFOL 500 MG/50ML IV EMUL
INTRAVENOUS | Status: DC | PRN
  Administered 2019-08-02: 75 ug/kg/min via INTRAVENOUS
  Administered 2019-08-02: 125 ug/kg/min via INTRAVENOUS

## 2019-08-02 MED ORDER — AMLODIPINE BESYLATE 10 MG PO TABS
10.0000 mg | ORAL_TABLET | Freq: Every day | ORAL | Status: DC
  Administered 2019-08-02 – 2019-08-09 (×8): 10 mg via ORAL
  Filled 2019-08-02 (×8): qty 1

## 2019-08-02 NOTE — Anesthesia Postprocedure Evaluation (Signed)
Anesthesia Post Note  Patient: Daniel [name]  Procedure(s) Performed: ENTEROSCOPY (N/A ) HEMOSTASIS CONTROL BIOPSY     Patient location during evaluation: PACU Anesthesia Type: MAC Level of consciousness: awake Pain management: pain level controlled Vital Signs Assessment: post-procedure vital signs reviewed and stable Respiratory status: spontaneous breathing Cardiovascular status: stable Postop Assessment: no apparent nausea or vomiting Anesthetic complications: no    Last Vitals:  Vitals:   08/02/19 1105 08/02/19 1141  BP: 136/86 (!) 178/78  Pulse: 73 (!) 51  Resp: 14 18  Temp:    SpO2: 100% 98%    Last Pain:  Vitals:   08/02/19 1105  TempSrc:   PainSc: 0-No pain                 Newel Oien

## 2019-08-02 NOTE — Plan of Care (Signed)
  Problem: Activity: Goal: Risk for activity intolerance will decrease Outcome: Progressing   

## 2019-08-02 NOTE — Progress Notes (Signed)
Upper extremity vein mapping has been completed.   Preliminary results in CV Proc.   Abram Sander 08/02/2019 3:36 PM

## 2019-08-02 NOTE — Transfer of Care (Signed)
Immediate Anesthesia Transfer of Care Note  Patient: Daniel [name]  Procedure(s) Performed: ENTEROSCOPY (N/A ) HEMOSTASIS CONTROL BIOPSY  Patient Location: PACU  Anesthesia Type:MAC  Level of Consciousness: awake, oriented and patient cooperative  Airway & Oxygen Therapy: Patient Spontanous Breathing and Patient connected to nasal cannula oxygen  Post-op Assessment: Report given to RN and Post -op Vital signs reviewed and stable  Post vital signs: Reviewed  Last Vitals:  Vitals Value Taken Time  BP 129/70 08/02/19 1052  Temp    Pulse 82 08/02/19 1054  Resp 17 08/02/19 1054  SpO2 100 % 08/02/19 1054  Vitals shown include unvalidated device data.  Last Pain:  Vitals:   08/02/19 1051  TempSrc:   PainSc: 0-No pain         Complications: No apparent anesthesia complications

## 2019-08-02 NOTE — Plan of Care (Signed)
  Problem: Education: Goal: Knowledge of General Education information will improve Description Including pain rating scale, medication(s)/side effects and non-pharmacologic comfort measures Outcome: Progressing   Problem: Health Behavior/Discharge Planning: Goal: Ability to manage health-related needs will improve Outcome: Progressing   

## 2019-08-02 NOTE — Anesthesia Procedure Notes (Signed)
Procedure Name: MAC Date/Time: 08/02/2019 10:06 AM Performed by: Jenne Campus, CRNA Pre-anesthesia Checklist: Patient identified, Emergency Drugs available, Suction available and Patient being monitored Oxygen Delivery Method: Nasal cannula

## 2019-08-02 NOTE — Interval H&P Note (Signed)
History and Physical Interval Note:  08/02/2019 9:47 AM  Daniel [name]  has presented today for surgery, with the diagnosis of anemia.  The various methods of treatment have been discussed with the patient and family. After consideration of risks, benefits and other options for treatment, the patient has consented to  Procedure(s): ENTEROSCOPY (N/A) as a surgical intervention.  The patient's history has been reviewed, patient examined, no change in status, stable for surgery.  I have reviewed the patient's chart and labs.  Questions were answered to the patient's satisfaction.     [place]

## 2019-08-02 NOTE — Op Note (Signed)
[name] Patient Name: Daniel [name] Procedure Date : 08/02/2019 MRN: [id number] Attending MD: Carlota Raspberry. Havery Moros , MD Date of Birth: [birth year] CSN: [id number] Age: 59 Admit Type: Inpatient Procedure:                Small bowel enteroscopy Indications:              recurrent anemia / intermittent dark stools                            concerning for obscure gastrointestinal bleeding,                            prior EGD / colonoscopy / capsule study without                            clear etiology one month ago Providers:                Remo Lipps P. Havery Moros, MD, Benetta Spar RN, RN,                            Cletis Athens, Technician, Luciana Axe, CRNA Referring MD:              Medicines:                Monitored Anesthesia Care Complications:            No immediate complications. Estimated blood loss:                            Minimal. Estimated Blood Loss:     Estimated blood loss was minimal. Procedure:                Pre-Anesthesia Assessment:                           - Prior to the procedure, a History and Physical                            was performed, and patient medications and                            allergies were reviewed. The patient's tolerance of                            previous anesthesia was also reviewed. The risks                            and benefits of the procedure and the sedation                            options and risks were discussed with the patient.                            All questions were answered, and informed consent  was obtained. Prior Anticoagulants: The patient has                            taken no previous anticoagulant or antiplatelet                            agents. ASA Grade Assessment: III - A patient with                            severe systemic disease. After reviewing the risks                            and benefits, the patient was deemed in                             satisfactory condition to undergo the procedure.                           After obtaining informed consent, the endoscope was                            passed under direct vision. Throughout the                            procedure, the patient's blood pressure, pulse, and                            oxygen saturations were monitored continuously. The                            [id number]-H190DL ([id number] Olympus pediatric colonscope                            was introduced through the mouth and advanced to                            the proximal jejunum. The small bowel enteroscopy                            was accomplished without difficulty. The patient                            tolerated the procedure well. Scope In: Scope Out: Findings:      Esophagogastric landmarks were identified: the cricopharyngeus was found       at 43 cm, the Z-line was found at 43 cm, the gastroesophageal junction       was found at 43 cm and the upper extent of the gastric folds was found       at 43 cm from the incisors.      The exam of the esophagus was otherwise normal.      The entire examined stomach was normal.      The ampulla was prominent but without obvious adenomatous tissue.       Biopsies were taken with a cold forceps for histology.  A few small sessile polypoid lesions were found in the duodenal bulb,       benign appearing. Biopsies were taken with a cold forceps for histology       to rule out adenomatous changes. One of the polypoid lesions located at       the 6 o clock position in the bulb just distal to the pylorus had small       amount of old heme on the side of it with ? small AVM, it was ablated       with APC.      A single angiodysplastic lesion with no bleeding was found in the       duodenal bulb. Fulguration to ablate the lesion to prevent bleeding by       argon plasma was successful.      The exam of the duodenum was otherwise normal.      A single angiodysplastic  lesion with no bleeding was found in the       proximal jejunum. Fulguration to ablate the lesion to prevent bleeding       by argon plasma was successful. The remainder of the examined portion of       the proximal jejunum appeared normal. Impression:               - Esophagogastric landmarks identified.                           - Normal esophagus.                           - Normal stomach.                           - Prominent ampulla without overt adenomatous                            changes. Biopsied.                           - A few benign appearing duodenal polyps. Biopsied,                            one suspected AVM treated as above.                           - A single non-bleeding angiodysplastic lesion in                            the duodenum. Treated with argon plasma coagulation                            (APC).                           - A single non-bleeding angiodysplastic lesion in                            the proximal jejunum. Treated with argon plasma  coagulation (APC).                           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 Recommendation:           - Return patient to hospital ward for ongoing care.                           - Clear liquid diet now, advance as tolerated later                            today                           - Continue present medications.                           - Await pathology results.                           - Serial Hgb, monitor for recurrent bleeding                            symptoms Procedure Code(s):        --- Professional ---                           [phone], 14, Small intestinal endoscopy, enteroscopy                            beyond second portion of duodenum, not including                            ileum; with control of bleeding (eg, injection,                             bipolar cautery, unipolar cautery, laser, heater                            probe, stapler, plasma coagulator)                           [id number], Small intestinal endoscopy, enteroscopy                            beyond second portion of duodenum, not including                            ileum; with biopsy, single or multiple Diagnosis Code(s):        --- Professional ---  K31.89, Other diseases of stomach and duodenum                           K31.7, Polyp of stomach and duodenum                           K31.819, Angiodysplasia of stomach and duodenum                            without bleeding                           K92.2, Gastrointestinal hemorrhage, unspecified CPT copyright 2019 [employer]. All rights reserved. The codes documented in this report are preliminary and upon coder review may  be revised to meet current compliance requirements. Remo Lipps P. Bertel Venard, MD 08/02/2019 10:55:29 AM This report has been signed electronically. Number of Addenda: 0

## 2019-08-02 NOTE — Progress Notes (Addendum)
  Progress Note    08/02/2019 7:47 AM * No surgery date entered *  Subjective: No complaints.  He is quite concerned about his life on hemodialysis, his job and paying bills.  He is n.p.o. for endoscopy today.   Vitals:   08/01/19 2122 08/02/19 0448  BP: (!) 145/81 (!) 152/77  Pulse: 76 64  Resp: 20 18  Temp: 98.6 F (37 C) 98.2 F (36.8 C)  SpO2: 99% 98%    Physical Exam: Cardiac: Rate rhythm regular Lungs: Nonlabored Extremities: Moves all extremities well  CBC    Component Value Date/Time   WBC 4.2 08/02/2019 0638   RBC 2.77 (L) 08/02/2019 0638   HGB 9.0 (L) 08/02/2019 0638   HCT 27.5 (L) 08/02/2019 0638   HCT 27.5 (L) 01/23/2019 1014   PLT 98 (L) 08/02/2019 0638   MCV 99.3 08/02/2019 0638   MCH 32.5 08/02/2019 0638   MCHC 32.7 08/02/2019 0638   RDW 20.2 (H) 08/02/2019 0638   LYMPHSABS 1.7 07/24/2019 0618   MONOABS 0.5 07/24/2019 0618   EOSABS 0.1 07/24/2019 0618   BASOSABS 0.0 07/24/2019 0618    BMET    Component Value Date/Time   NA 142 08/01/2019 0702   K 3.3 (L) 08/01/2019 0702   CL 105 08/01/2019 0702   CO2 20 (L) 08/01/2019 0702   GLUCOSE 118 (H) 08/01/2019 0702   BUN 107 (H) 08/01/2019 0702   CREATININE 8.30 (H) 08/01/2019 0702   CALCIUM 7.9 (L) 08/01/2019 0702   CALCIUM 8.1 (L) 08/01/2019 0702   GFRNONAA 6 (L) 08/01/2019 0702   GFRAA 7 (L) 08/01/2019 0702     Intake/Output Summary (Last 24 hours) at 08/02/2019 0747 Last data filed at 08/02/2019 0500 Gross per 24 hour  Intake 1140 ml  Output 875 ml  Net 265 ml    HOSPITAL MEDICATIONS Scheduled Meds: . pantoprazole  40 mg Oral Daily  . sodium bicarbonate  1,300 mg Oral BID  . sodium chloride flush  3 mL Intravenous Once   Continuous Infusions: . sodium chloride     PRN Meds:.  Assessment:  59 y.o. male assess for permanent hemodialysis access.   Vein mapping duplex of upper extremities pending.  * No surgery date entered *  Plan: - Plan is to proceed with tunneled dialysis  catheter as well as left upper extremity AV fistula tomorrow.  I reviewed these procedures with him this morning and his questions are answered. -DVT prophylaxis: SCDs   Risa Grill, PA-C [facility] [phone] 08/02/2019  7:47 AM   I have seen and evaluated the patient. I agree with the PA note as documented above. Plan left arm avf vs graft tomorrow and TDC placement.  Please keep NPO after midnight.  Has endoscopy with GI today so will delay surgery until tomorrow.  Marty Heck, MD [facility] Office: [phone]

## 2019-08-02 NOTE — Progress Notes (Signed)
[facility] Progress Note   Subjective:   Patient feeling well this morning with no acute complaints. He denies fatigue, night sweats, change in vision, chest pain, shortness of breath, or palpitations. SCDs hooked up and turned on prior to leaving the room.   Patient denied history of DMT2 and did not know he was having problems with his kidneys prior to admission. Reinforced to patient that vascular surgery will placing TDC and LUE AVF tomorrow so he can have dialysis in the hospital to help his kidneys. Patient notes that he is having a scope done at 9 AM (enteroscopy for reevaluation of GI bleed).  He is very concerned about his financial and living situation. Patient would like to have someone from social work talk to him. Upon reviewing chart, case manager met with patient yesterday for initial visit with plans to follow up.  Objective Vitals:   08/01/19 0955 08/01/19 1709 08/01/19 2122 08/02/19 0448  BP: (!) 144/86  (!) 145/81 (!) 152/77  Pulse: 72  76 64  Resp: (!) _0 Temp: 97.9 F (36.6 C)  98.6 F (37 C) 98.2 F (36.8 C)  TempSrc: Oral  Oral Oral  SpO2: 100% 100% 99% 98%  Weight:   113.1 kg   Height:       Physical Exam General: Male patient resting in hospital bed in NAD. Heart: Appears perfused. No lifts, heaves, or thrills.  Lungs: Good respiratory effort. No increased work of breathing or use of accessory muscles.  Abdomen: Soft, nondistended, and nontender.  Extremities: 2+ pulses bilaterally, no lower extremity edema. Dialysis Access: None.  Filed Weights   07/31/19 1659 08/01/19 2122  Weight: 124.7 kg 113.1 kg    Intake/Output Summary (Last 24 hours) at 08/02/2019 0811 Last data filed at 08/02/2019 0500 Gross per 24 hour  Intake 1140 ml  Output 875 ml  Net 265 ml    Additional Objective Labs: Basic Metabolic Panel: Recent Labs  Lab 07/31/19 2033 08/01/19 0702 08/02/19 0713  NA 137 142 142  K 2.9* 3.3* 3.2*  CL 104 105 108   CO2 18* 20* 19*  GLUCOSE 113* 118* 111*  BUN 105* 107* 111*  CREATININE 8.25* 8.30* 8.22*  CALCIUM 7.7* 8.1*  7.9* 8.3*  PHOS 4.5 7.2* 6.4*   Liver Function Tests: Recent Labs  Lab 07/31/19 2033 08/01/19 0702 08/02/19 0713  AST  --  111*  --   ALT  --  44  --   ALKPHOS  --  58  --   BILITOT  --  0.6  --   PROT  --  7.0  --   ALBUMIN 2.3* 2.2*  2.2* 2.2*   No results for input(s): LIPASE, AMYLASE in the last 168 hours. CBC: Recent Labs  Lab 07/31/19 1713 08/01/19 0702 08/02/19 0638  WBC 4.3 4.3 4.2  HGB 7.1* 8.9* 9.0*  HCT 21.2* 26.0* 27.5*  MCV 102.9* 97.7 99.3  PLT 114* 110* 98*   Blood Culture    Component Value Date/Time   SDES ABDOMEN ABSCESS 06/29/2016 1300   SPECREQUEST NONE 06/29/2016 1300   CULT  06/29/2016 1300    MODERATE STAPHYLOCOCCUS LUGDUNENSIS ABUNDANT PEPTOSTREPTOCOCCUS SPECIES ABUNDANT ANAEROBIC GRAM NEGATIVE ROD UNABLE TO FURTHER IDENTIFY. BETA LACTAMASE NEGATIVE Performed at [facility], [place] [address] [place], [place] [address]    REPTSTATUS 07/06/2016 FINAL 06/29/2016 1300    Cardiac Enzymes: No results for input(s): CKTOTAL, CKMB, CKMBINDEX, TROPONINI in the last 168 hours. CBG: Recent  Labs  Lab 07/31/19 1703  GLUCAP 178*   Iron Studies: No results for input(s): IRON, TIBC, TRANSFERRIN, FERRITIN in the last 72 hours. Lab Results  Component Value Date   INR 1.0 07/23/2019   INR 1.0 06/27/2019   INR 1.0 01/22/2019    Ref Range & Units 08/01/19  PTH 15 - 65 pg/mL 217 High    Calcium, Total (PTH) 8.7 - 10.2 mg/dL 7.9 Low     Urinalysis    Component Value Date/Time   COLORURINE YELLOW 07/31/2019 2128   APPEARANCEUR CLEAR 07/31/2019 2128   LABSPEC 1.009 07/31/2019 2128   PHURINE 5.0 07/31/2019 2128   GLUCOSEU NEGATIVE 07/31/2019 2128   HGBUR SMALL (A) 07/31/2019 2128   BILIRUBINUR NEGATIVE 07/31/2019 2128   KETONESUR NEGATIVE 07/31/2019 2128   PROTEINUR 100 (A) 07/31/2019 2128   UROBILINOGEN 0.2 12/31/2009 0746    NITRITE NEGATIVE 07/31/2019 2128   LEUKOCYTESUR NEGATIVE 07/31/2019 2128   Studies/Results: No results found.  Medications: . sodium chloride    . [START ON 08/03/2019] cefUROXime (ZINACEF)  IV    . lactated ringers     . pantoprazole  40 mg Oral Daily  . sodium bicarbonate  1,300 mg Oral BID  . sodium chloride flush  3 mL Intravenous Once   Assessment/Plan: 1. Progressive renal insufficiency and active appearing urine sediment. History of CKD V, DMT2, ACD, Hep C, and HTN. Admitted 07/31/19 with symptomatic anemia s/p dizziness and ground-level fall with BUN:Cr of 104:8.06. Patient previously evaluated by Dr. Lin for significant progression of CKD with intended follow up with [facility]. - Workup for autoimmune causes and multiple myeloma were non-contributory. - CT abd 07/17/19 showed no evidence of obstruction. UA + for Hgb and protein. - Renal biopsy would likely not provide any diagnostic or therapeutic benefit.  - BUN and Cr persistently elevated to 111 and 8.22. Protein:Cr and EPO pending. Currently making good urine.  - Vasc Surg placing TDC and LUE AVF tomorrow, 08/03/19, to start HD. Deferred to tomorrow considering patient's enteroscopy this morning.   2. Anemia of CKD - GI blood loss with feraheme administered int he past. Recent transfusions in Dec 2020 and twice in Mar 2020. GI workup with EGD, colonoscopy, and capsule study Mar 2020 with no confirmed etiology. Hgb 7.1 on admission. Monitor and transfuse as needed.  - Enteroscopy today, 08/02/19, to re-evaluate small bowel per GI   3. Secondary hyperparathyroidism - PTH 217, Ca 7.9 - Plan to address with initiation of dialysis   4. HTN/volume - BP 144/86-152/77 with some symptomatic orthostatis. Controlled at this time. Holding antihypertensives secondary to GI bleed.   5. History of untreated Hep C. 3.7 million 12/2018  6. Diabetes. Per primary team.       , PA-S2 [employer] 08/02/2019,8:11  AM   

## 2019-08-02 NOTE — Progress Notes (Signed)
Pre op orders placed for TDC/left arm access tomorrow by Dr. Donzetta Matters.  Npo after MN/consent/labs.  Daniel [name], [facility] 08/02/2019 2:05 PM

## 2019-08-02 NOTE — Progress Notes (Signed)
Patient ID: [name], male   DOB: Nov 02, 1960, 59 y.o.   MRN: [id number]   Subjective: HD: 1  [name] was seen this afternoon and reports feeling stressed about needing to leave soon to take care of some bills that he needs to pay. He expressed desire to have his case manager come and speak with him some time today to help him with these issues. Otherwise he is physically feeling well. He does not recall any further melena and denies any further dizziness, nausea, or orthostatic symptoms. His only question was about the difference between the tunnel cathater and AV fistula and why he needed both. I explained that the cathater will be temporary as the AV fistula matures and that the cathater will be so he can start dialysis immediately. He expressed understanding of this and had no further questions or concerns at this time.    Objective:  Vital signs in last 24 hours: Vitals:   08/01/19 0955 08/01/19 1709 08/01/19 2122 08/02/19 0448  BP: (!) 144/86  (!) 145/81 (!) 152/77  Pulse: 72  76 64  Resp: (!) 24 18 20 18   Temp: 97.9 F (36.6 C)  98.6 F (37 C) 98.2 F (36.8 C)  TempSrc: Oral  Oral Oral  SpO2: 100% 100% 99% 98%  Weight:   113.1 kg   Height:       Weight change: -11.6 kg  Intake/Output Summary (Last 24 hours) at 08/02/2019 0705 Last data filed at 08/02/2019 0500 Gross per 24 hour  Intake 1140 ml  Output 1175 ml  Net -35 ml   Physical Exam Cardiovascular:     Rate and Rhythm: Normal rate and regular rhythm.     Pulses: Normal pulses.     Heart sounds: Normal heart sounds.  Pulmonary:     Effort: Pulmonary effort is normal.     Breath sounds: Normal breath sounds.  Musculoskeletal:     Right lower leg: No edema.     Left lower leg: No edema.  Skin:    General: Skin is warm and dry.  Neurological:     Mental Status: He is alert.     CBC Latest Ref Rng & Units 08/02/2019 08/01/2019 07/31/2019  WBC 4.0 - 10.5 K/uL 4.2 4.3 4.3  Hemoglobin 13.0 - 17.0 g/dL  9.0(L) 8.9(L) 7.1(L)  Hematocrit 39.0 - 52.0 % 27.5(L) 26.0(L) 21.2(L)  Platelets 150 - 400 K/uL 98(L) 110(L) 114(L)   BMP Latest Ref Rng & Units 08/02/2019 08/01/2019 08/01/2019  Glucose 70 - 99 mg/dL 111(H) 118(H) -  BUN 6 - 20 mg/dL 111(H) 107(H) -  Creatinine 0.61 - 1.24 mg/dL 8.22(H) 8.30(H) -  Sodium 135 - 145 mmol/L 142 142 -  Potassium 3.5 - 5.1 mmol/L 3.2(L) 3.3(L) -  Chloride 98 - 111 mmol/L 108 105 -  CO2 22 - 32 mmol/L 19(L) 20(L) -  Calcium 8.9 - 10.3 mg/dL 8.3(L) 8.1(L) 7.9(L)   Small bowel enteroscopy:   Normal esophagus. Normal stomach. Prominent ampulla without overt adenomatous changes.   A few benign appearing duodenal polyps. Biopsied, one suspected AVM, treated.  A single non-bleeding angiodysplastic lesion in the duodenum. Treated with argon plasma coagulation (APC). A single non-bleeding angiodysplastic lesion in the proximal jejunum. Treated with argon plasma coagulation (APC).  Assessment/Plan:  Principal Problem:   Symptomatic anemia Active Problems:   Hypertension   DM2 (diabetes mellitus, type 2) (HCC)   Orthostatic hypotension   CKD (chronic kidney disease), stage V (HCC)   Acute on chronic  anemia   Chronic kidney disease, stage V ([place])  Summary:  Elzy Tomasello is a 59 year old male with a past medical history of CKD stage 5, untreated HCV, type II DM and anemia of chronic disease admitted for symptomatic acute on chronic anemia due to chronic renal failure and ongoing GI bleed on HD 1.   Symptomatic anemia, orthostatic hypotension:  Hgb is stable today at 9.0 after post transfusion hgb of 8.9 yesterday.  Orthostatic vital yesterday evening showed continued orthostatic hypotension. GI was consulted yesterday and did enteroscopy today, which showed small bowel AVMs which were treated and could be the cause of the patient's anemia and orthostatic hypotension. GI following; we appreciate all recommendations.  - continue protonix - EPO level pending - 1  L IV lactated ringer infusion   CKD stage V, now ESRD:  Consulted nephrology who has classified him as ESRD and recommending hemodialysis. Vascular surgery consulted and following as well. We appreciate all recommendations.  - nephrology consulted; now ESRD - vascular surgery consulted; vein mapping, L arm AV fistula and catheter placement on Wednesday - dialysis to be initiated on 04/07 - 1300 mg sodium bicarbonate BID - start norvasc for HTN  - KCl 20 mEq one time dose   HCV:  History of untreated HCV. No evidence of cirrhosis on recent US 03/21.  - infectious disease referral to outpatient clinic for follow up treatment   Diet: renal w/ fluid restriction 1200 mL VTE ppx: SCDs, no lovenox due to GI bleed Code: full Dispo: to be determined    LOS: 1 day   Mikael Spray, Medical Student 08/02/2019, 7:05 AM

## 2019-08-03 ENCOUNTER — Encounter: Payer: Self-pay | Admitting: Gastroenterology

## 2019-08-03 ENCOUNTER — Inpatient Hospital Stay (HOSPITAL_COMMUNITY): Payer: Self-pay

## 2019-08-03 ENCOUNTER — Inpatient Hospital Stay (HOSPITAL_COMMUNITY): Payer: Self-pay | Admitting: Certified Registered"

## 2019-08-03 ENCOUNTER — Encounter (HOSPITAL_COMMUNITY)
Admission: EM | Disposition: A | Discharge status: Home / Self Care | Payer: Self-pay | Source: Home / Self Care | Attending: Internal Medicine

## 2019-08-03 ENCOUNTER — Encounter (HOSPITAL_COMMUNITY): Payer: Self-pay | Admitting: Internal Medicine

## 2019-08-03 DIAGNOSIS — K5521 Angiodysplasia of colon with hemorrhage: Principal | ICD-10-CM

## 2019-08-03 HISTORY — PX: INSERTION OF DIALYSIS CATHETER: SHX1324

## 2019-08-03 HISTORY — PX: AV FISTULA PLACEMENT: SHX1204

## 2019-08-03 LAB — CBC
HCT: 28.9 % — ABNORMAL LOW (ref 39.0–52.0)
Hemoglobin: 9.8 g/dL — ABNORMAL LOW (ref 13.0–17.0)
MCH: 33.4 pg (ref 26.0–34.0)
MCHC: 33.9 g/dL (ref 30.0–36.0)
MCV: 98.6 fL (ref 80.0–100.0)
Platelets: 102 10*3/uL — ABNORMAL LOW (ref 150–400)
RBC: 2.93 MIL/uL — ABNORMAL LOW (ref 4.22–5.81)
RDW: 20 % — ABNORMAL HIGH (ref 11.5–15.5)
WBC: 4.6 10*3/uL (ref 4.0–10.5)
nRBC: 0 % (ref 0.0–0.2)

## 2019-08-03 LAB — BASIC METABOLIC PANEL
Anion gap: 13 (ref 5–15)
BUN: 99 mg/dL — ABNORMAL HIGH (ref 6–20)
CO2: 20 mmol/L — ABNORMAL LOW (ref 22–32)
Calcium: 8.4 mg/dL — ABNORMAL LOW (ref 8.9–10.3)
Chloride: 110 mmol/L (ref 98–111)
Creatinine, Ser: 8.22 mg/dL — ABNORMAL HIGH (ref 0.61–1.24)
GFR calc Af Amer: 7 mL/min — ABNORMAL LOW (ref >60)
GFR calc non Af Amer: 6 mL/min — ABNORMAL LOW (ref >60)
Glucose, Bld: 138 mg/dL — ABNORMAL HIGH (ref 70–99)
Potassium: 3.1 mmol/L — ABNORMAL LOW (ref 3.5–5.1)
Sodium: 143 mmol/L (ref 135–145)

## 2019-08-03 LAB — GLUCOSE, CAPILLARY
Glucose-Capillary: 121 mg/dL — ABNORMAL HIGH (ref 70–99)
Glucose-Capillary: 171 mg/dL — ABNORMAL HIGH (ref 70–99)

## 2019-08-03 LAB — SURGICAL PATHOLOGY

## 2019-08-03 LAB — PROTIME-INR
INR: 1 (ref 0.8–1.2)
Prothrombin Time: 13 seconds (ref 11.4–15.2)

## 2019-08-03 SURGERY — ARTERIOVENOUS (AV) FISTULA CREATION
Anesthesia: Regional | Site: Arm Lower | Laterality: Right

## 2019-08-03 MED ORDER — SODIUM CHLORIDE 0.9 % IV SOLN
INTRAVENOUS | Status: AC
  Filled 2019-08-03: qty 1.2

## 2019-08-03 MED ORDER — PHENYLEPHRINE HCL-NACL 10-0.9 MG/250ML-% IV SOLN
INTRAVENOUS | Status: DC | PRN
  Administered 2019-08-03: 25 ug/min via INTRAVENOUS

## 2019-08-03 MED ORDER — SODIUM CHLORIDE 0.9 % IV SOLN
INTRAVENOUS | Status: DC | PRN
  Administered 2019-08-03: 500 mL

## 2019-08-03 MED ORDER — HEPARIN SODIUM (PORCINE) 1000 UNIT/ML IJ SOLN
INTRAMUSCULAR | Status: AC
  Filled 2019-08-03: qty 1

## 2019-08-03 MED ORDER — OXYCODONE HCL 5 MG/5ML PO SOLN: 5.0000 mg | Freq: Once | ORAL | Status: DC | PRN

## 2019-08-03 MED ORDER — PROPOFOL 10 MG/ML IV BOLUS
INTRAVENOUS | Status: AC
  Filled 2019-08-03: qty 20

## 2019-08-03 MED ORDER — MIDAZOLAM HCL 2 MG/2ML IJ SOLN
INTRAMUSCULAR | Status: DC | PRN
  Administered 2019-08-03: 1 mg via INTRAVENOUS

## 2019-08-03 MED ORDER — OXYCODONE-ACETAMINOPHEN 5-325 MG PO TABS
1.0000 | ORAL_TABLET | Freq: Four times a day (QID) | ORAL | Status: DC | PRN
  Administered 2019-08-03 – 2019-08-04 (×2): 1 via ORAL
  Filled 2019-08-03 (×2): qty 1

## 2019-08-03 MED ORDER — POTASSIUM CHLORIDE CRYS ER 20 MEQ PO TBCR: 40.0000 meq | EXTENDED_RELEASE_TABLET | Freq: Once | ORAL | Status: DC

## 2019-08-03 MED ORDER — LIDOCAINE-EPINEPHRINE (PF) 1 %-1:200000 IJ SOLN
INTRAMUSCULAR | Status: DC | PRN
  Administered 2019-08-03: 10 mL

## 2019-08-03 MED ORDER — FENTANYL CITRATE (PF) 100 MCG/2ML IJ SOLN: 50.0000 ug | Freq: Once | INTRAMUSCULAR | Status: AC

## 2019-08-03 MED ORDER — FENTANYL CITRATE (PF) 100 MCG/2ML IJ SOLN
INTRAMUSCULAR | Status: DC | PRN
  Administered 2019-08-03: 50 ug via INTRAVENOUS

## 2019-08-03 MED ORDER — HEPARIN SODIUM (PORCINE) 1000 UNIT/ML IJ SOLN
INTRAMUSCULAR | Status: DC | PRN
  Administered 2019-08-03: 3000 [IU]

## 2019-08-03 MED ORDER — SODIUM CHLORIDE 0.9 % IV SOLN: INTRAVENOUS | Status: DC

## 2019-08-03 MED ORDER — LIDOCAINE-EPINEPHRINE (PF) 1.5 %-1:200000 IJ SOLN
INTRAMUSCULAR | Status: DC | PRN
  Administered 2019-08-03: 30 mL via PERINEURAL

## 2019-08-03 MED ORDER — ONDANSETRON HCL 4 MG/2ML IJ SOLN
INTRAMUSCULAR | Status: AC
  Filled 2019-08-03: qty 2

## 2019-08-03 MED ORDER — MIDAZOLAM HCL 2 MG/2ML IJ SOLN
INTRAMUSCULAR | Status: AC
  Filled 2019-08-03: qty 2

## 2019-08-03 MED ORDER — STERILE WATER FOR IRRIGATION IR SOLN
Status: DC | PRN
  Administered 2019-08-03: 1000 mL

## 2019-08-03 MED ORDER — FENTANYL CITRATE (PF) 250 MCG/5ML IJ SOLN
INTRAMUSCULAR | Status: AC
  Filled 2019-08-03: qty 5

## 2019-08-03 MED ORDER — HYDROMORPHONE HCL 1 MG/ML IJ SOLN
INTRAMUSCULAR | Status: AC
  Filled 2019-08-03: qty 1

## 2019-08-03 MED ORDER — ONDANSETRON HCL 4 MG/2ML IJ SOLN
INTRAMUSCULAR | Status: DC | PRN
  Administered 2019-08-03: 4 mg via INTRAVENOUS

## 2019-08-03 MED ORDER — POTASSIUM CHLORIDE CRYS ER 20 MEQ PO TBCR
40.0000 meq | EXTENDED_RELEASE_TABLET | Freq: Once | ORAL | Status: AC
  Administered 2019-08-03: 40 meq via ORAL
  Filled 2019-08-03: qty 2

## 2019-08-03 MED ORDER — OXYCODONE HCL 5 MG PO TABS: 5.0000 mg | ORAL_TABLET | Freq: Once | ORAL | Status: DC | PRN

## 2019-08-03 MED ORDER — LIDOCAINE-EPINEPHRINE 1 %-1:100000 IJ SOLN
INTRAMUSCULAR | Status: AC
  Filled 2019-08-03: qty 1

## 2019-08-03 MED ORDER — 0.9 % SODIUM CHLORIDE (POUR BTL) OPTIME
TOPICAL | Status: DC | PRN
  Administered 2019-08-03: 1000 mL

## 2019-08-03 MED ORDER — HYDROMORPHONE HCL 1 MG/ML IJ SOLN
0.2500 mg | INTRAMUSCULAR | Status: DC | PRN
  Administered 2019-08-03 (×2): 0.5 mg via INTRAVENOUS

## 2019-08-03 MED ORDER — PROPOFOL 500 MG/50ML IV EMUL
INTRAVENOUS | Status: DC | PRN
  Administered 2019-08-03: 100 ug/kg/min via INTRAVENOUS

## 2019-08-03 MED ORDER — FENTANYL CITRATE (PF) 100 MCG/2ML IJ SOLN
INTRAMUSCULAR | Status: AC
  Administered 2019-08-03: 12:00:00 50 ug via INTRAVENOUS
  Filled 2019-08-03: qty 2

## 2019-08-03 MED ORDER — PROMETHAZINE HCL 25 MG/ML IJ SOLN: 6.2500 mg | INTRAMUSCULAR | Status: DC | PRN

## 2019-08-03 SURGICAL SUPPLY — 52 items
ADH SKN CLS APL DERMABOND .7 (GAUZE/BANDAGES/DRESSINGS) ×2
ARMBAND PINK RESTRICT EXTREMIT (MISCELLANEOUS) ×3 IMPLANT
BAG DECANTER FOR FLEXI CONT (MISCELLANEOUS) ×3 IMPLANT
BIOPATCH RED 1 DISK 7.0 (GAUZE/BANDAGES/DRESSINGS) ×3 IMPLANT
CANISTER SUCT 3000ML PPV (MISCELLANEOUS) ×3 IMPLANT
CATH PALINDROME RT-P 15FX19CM (CATHETERS) ×1 IMPLANT
CATH PALINDROME RT-P 15FX23CM (CATHETERS) IMPLANT
CATH PALINDROME RT-P 15FX28CM (CATHETERS) IMPLANT
CATH PALINDROME RT-P 15FX55CM (CATHETERS) IMPLANT
CLIP VESOCCLUDE MED 6/CT (CLIP) ×3 IMPLANT
CLIP VESOCCLUDE SM WIDE 6/CT (CLIP) ×3 IMPLANT
COVER PROBE W GEL 5X96 (DRAPES) ×3 IMPLANT
COVER SURGICAL LIGHT HANDLE (MISCELLANEOUS) ×3 IMPLANT
COVER WAND RF STERILE (DRAPES) ×2 IMPLANT
DERMABOND ADVANCED (GAUZE/BANDAGES/DRESSINGS) ×2
DERMABOND ADVANCED .7 DNX12 (GAUZE/BANDAGES/DRESSINGS) ×2 IMPLANT
DRAPE C-ARM 42X72 X-RAY (DRAPES) ×3 IMPLANT
DRAPE CHEST BREAST 15X10 FENES (DRAPES) ×2 IMPLANT
ELECT REM PT RETURN 9FT ADLT (ELECTROSURGICAL) ×3
ELECTRODE REM PT RTRN 9FT ADLT (ELECTROSURGICAL) ×2 IMPLANT
GAUZE 4X4 16PLY RFD (DISPOSABLE) ×3 IMPLANT
GLOVE BIO SURGEON STRL SZ7.5 (GLOVE) ×3 IMPLANT
GLOVE SURG SS PI 7.0 STRL IVOR (GLOVE) ×2 IMPLANT
GOWN STRL REUS W/ TWL LRG LVL3 (GOWN DISPOSABLE) ×4 IMPLANT
GOWN STRL REUS W/ TWL XL LVL3 (GOWN DISPOSABLE) ×2 IMPLANT
GOWN STRL REUS W/TWL LRG LVL3 (GOWN DISPOSABLE) ×6
GOWN STRL REUS W/TWL XL LVL3 (GOWN DISPOSABLE) ×3
INSERT FOGARTY SM (MISCELLANEOUS) ×1 IMPLANT
KIT BASIN OR (CUSTOM PROCEDURE TRAY) ×3 IMPLANT
KIT TURNOVER KIT B (KITS) ×3 IMPLANT
NDL 18GX1X1/2 (RX/OR ONLY) (NEEDLE) ×2 IMPLANT
NDL HYPO 25GX1X1/2 BEV (NEEDLE) ×2 IMPLANT
NEEDLE 18GX1X1/2 (RX/OR ONLY) (NEEDLE) ×3 IMPLANT
NEEDLE HYPO 25GX1X1/2 BEV (NEEDLE) ×3 IMPLANT
NS IRRIG 1000ML POUR BTL (IV SOLUTION) ×3 IMPLANT
PACK CV ACCESS (CUSTOM PROCEDURE TRAY) ×3 IMPLANT
PACK SURGICAL SETUP 50X90 (CUSTOM PROCEDURE TRAY) ×2 IMPLANT
PAD ARMBOARD 7.5X6 YLW CONV (MISCELLANEOUS) ×6 IMPLANT
SOAP 2 % CHG 4 OZ (WOUND CARE) ×3 IMPLANT
SUT ETHILON 3 0 PS 1 (SUTURE) ×3 IMPLANT
SUT MNCRL AB 4-0 PS2 18 (SUTURE) ×4 IMPLANT
SUT PROLENE 6 0 BV (SUTURE) ×4 IMPLANT
SUT VIC AB 3-0 SH 27 (SUTURE) ×3
SUT VIC AB 3-0 SH 27X BRD (SUTURE) ×2 IMPLANT
SYR 10ML LL (SYRINGE) ×3 IMPLANT
SYR 20ML LL LF (SYRINGE) ×6 IMPLANT
SYR 5ML LL (SYRINGE) ×3 IMPLANT
SYR CONTROL 10ML LL (SYRINGE) ×3 IMPLANT
TOWEL GREEN STERILE (TOWEL DISPOSABLE) ×3 IMPLANT
TOWEL GREEN STERILE FF (TOWEL DISPOSABLE) ×6 IMPLANT
UNDERPAD 30X30 (UNDERPADS AND DIAPERS) ×3 IMPLANT
WATER STERILE IRR 1000ML POUR (IV SOLUTION) ×3 IMPLANT

## 2019-08-03 NOTE — Transfer of Care (Signed)
Immediate Anesthesia Transfer of Care Note  Patient: Daniel [name]  Procedure(s) Performed: LEFT ARM ARTERIOVENOUS (AV) CEPHALIC  FISTULA CREATION (Left Arm Lower) INSERTION OF DIALYSIS CATHETER (Right )  Patient Location: PACU  Anesthesia Type:MAC and Regional  Level of Consciousness: awake and oriented  Airway & Oxygen Therapy: Patient Spontanous Breathing  Post-op Assessment: Report given to RN  Post vital signs: Reviewed and stable  Last Vitals:  Vitals Value Taken Time  BP    Temp    Pulse 96 08/03/19 1412  Resp 16 08/03/19 1412  SpO2 96 % 08/03/19 1412  Vitals shown include unvalidated device data.  Last Pain:  Vitals:   08/03/19 1230  TempSrc:   PainSc: 0-No pain         Complications: No apparent anesthesia complications

## 2019-08-03 NOTE — Discharge Instructions (Signed)
[facility]  Discharge Instructions  AV Fistula or Graft Surgery for Dialysis Access  Please refer to the following instructions for your post-procedure care. Your surgeon or physician assistant will discuss any changes with you.  Activity  You may drive the day following your surgery, if you are comfortable and no longer taking prescription pain medication. Resume full activity as the soreness in your incision resolves.  Bathing/Showering  You may shower after you go home. Keep your incision dry for 48 hours. Do not soak in a bathtub, hot tub, or swim until the incision heals completely. You may not shower if you have a hemodialysis catheter.  Incision Care  Clean your incision with mild soap and water after 48 hours. Pat the area dry with a clean towel. You do not need a bandage unless otherwise instructed. Do not apply any ointments or creams to your incision. You may have skin glue on your incision. Do not peel it off. It will come off on its own in about one week. Your arm may swell a bit after surgery. To reduce swelling use pillows to elevate your arm so it is above your heart. Your doctor will tell you if you need to lightly wrap your arm with an ACE bandage.  Diet  Resume your normal diet. There are not special food restrictions following this procedure. In order to heal from your surgery, it is CRITICAL to get adequate nutrition. Your body requires vitamins, minerals, and protein. Vegetables are the best source of vitamins and minerals. Vegetables also provide the perfect balance of protein. Processed food has little nutritional value, so try to avoid this.  Medications  Resume taking all of your medications. If your incision is causing pain, you may take over-the counter pain relievers such as acetaminophen (Tylenol). If you were prescribed a stronger pain medication, please be aware these medications can cause nausea and constipation. Prevent  nausea by taking the medication with a snack or meal. Avoid constipation by drinking plenty of fluids and eating foods with high amount of fiber, such as fruits, vegetables, and grains.  Do not take Tylenol if you are taking prescription pain medications.  Follow up Your surgeon may want to see you in the office following your access surgery. If so, this will be arranged at the time of your surgery.  Please call us immediately for any of the following conditions:  . Increased pain, redness, drainage (pus) from your incision site . Fever of 101 degrees or higher . Severe or worsening pain at your incision site . Hand pain or numbness. .  Reduce your risk of vascular disease:  . Stop smoking. If you would like help, call [facility] at 1-800-QUIT-NOW 302-008-1683) or [place] at (575)422-8140  . Manage your cholesterol . Maintain a desired weight . Control your diabetes . Keep your blood pressure down  Dialysis  It will take several weeks to several months for your new dialysis access to be ready for use. Your surgeon will determine when it is okay to use it. Your nephrologist will continue to direct your dialysis. You can continue to use your Permcath until your new access is ready for use.   08/03/2019 YEHYA BRENDLE [id number] [birth year]  Surgeon(s): Waynetta Sandy, MD  Procedure(s): Creation left brachiocephalic AV fistula and insertion of tunneled dialysis catheter.   x Do not stick fistula for 12 weeks    If you have any questions, please call the office at [phone].

## 2019-08-03 NOTE — Progress Notes (Signed)
Daily Rounding Note  08/03/2019, 11:19 AM  LOS: 2 days   SUBJECTIVE:   Chief complaint: Anemia, FOBT positive stool recurrent.    Patient feels okay.  Eating well but he is n.p.o. now to allow for AV fistula procedure this afternoon.  Plan is to start dialysis this afternoon via temporary catheter. No BM yesterday or today.  OBJECTIVE:         Vital signs in last 24 hours:    Temp:  [98.2 F (36.8 C)-98.5 F (36.9 C)] 98.5 F (36.9 C) (04/07 0839) Pulse Rate:  [51-79] 70 (04/07 0839) Resp:  [14-18] 18 (04/07 0839) BP: (138-178)/(78-116) 165/90 (04/07 0839) SpO2:  [98 %-100 %] 100 % (04/07 0839) Weight:  [111.6 kg] 111.6 kg (04/06 2152) Last BM Date: 08/01/19 Filed Weights   08/01/19 2122 08/02/19 0917 08/02/19 2152  Weight: 113.1 kg 113.1 kg 111.6 kg   General: Comfortable, alert. Heart: RRR Chest: Clear bilaterally. Abdomen: Obese, soft, nontender, obese/slight protuberant. Extremities: No CCE. Neuro/Psych: Alert.  Appropriate.  Oriented x3.  Intake/Output from previous day: 04/06 0701 - 04/07 0700 In: 680 [P.O.:480; I.V.:200] Out: 2225 [Urine:2225]  Intake/Output this shift: Total I/O In: -  Out: 200 [Urine:200]  Lab Results: Recent Labs    08/01/19 0702 08/02/19 0638 08/03/19 0547  WBC 4.3 4.2 4.6  HGB 8.9* 9.0* 9.8*  HCT 26.0* 27.5* 28.9*  PLT 110* 98* 102*   BMET Recent Labs    08/01/19 0702 08/02/19 0713 08/03/19 0547  NA 142 142 143  K 3.3* 3.2* 3.1*  CL 105 108 110  CO2 20* 19* 20*  GLUCOSE 118* 111* 138*  BUN 107* 111* 99*  CREATININE 8.30* 8.22* 8.22*  CALCIUM 8.1*  7.9* 8.3* 8.4*   LFT Recent Labs    07/31/19 2033 08/01/19 0702 08/02/19 0713  PROT  --  7.0  --   ALBUMIN 2.3* 2.2*  2.2* 2.2*  AST  --  111*  --   ALT  --  44  --   ALKPHOS  --  58  --   BILITOT  --  0.6  --   BILIDIR  --  0.1  --   IBILI  --  0.5  --    PT/INR Recent Labs    08/03/19 0547    LABPROT 13.0  INR 1.0   Hepatitis Panel No results for input(s): HEPBSAG, HCVAB, HEPAIGM, HEPBIGM in the last 72 hours.  Studies/Results: VAS [place] UPPER EXT VEIN MAPPING (PRE-OP AVF)  Result Date: 08/03/2019 UPPER EXTREMITY VEIN MAPPING  Indications: Pre-access. Performing Technologist: Abram Sander RVS  Examination Guidelines: A complete evaluation includes B-mode imaging, spectral Doppler, color Doppler, and power Doppler as needed of all accessible portions of each vessel. Bilateral testing is considered an integral part of a complete examination. Limited examinations for reoccurring indications may be performed as noted. +-----------------+-------------+----------+--------+ Right Cephalic   Diameter (cm)Depth (cm)Findings +-----------------+-------------+----------+--------+ Shoulder             0.17        1.34            +-----------------+-------------+----------+--------+ Prox upper arm       0.16        0.96            +-----------------+-------------+----------+--------+ Mid upper arm        0.16        0.50            +-----------------+-------------+----------+--------+  Dist upper arm       0.16        0.43            +-----------------+-------------+----------+--------+ Antecubital fossa    0.22        0.23            +-----------------+-------------+----------+--------+ Prox forearm         0.24        0.79            +-----------------+-------------+----------+--------+ Mid forearm          0.14        0.50            +-----------------+-------------+----------+--------+ Dist forearm         0.17        0.28            +-----------------+-------------+----------+--------+ +-----------------+-------------+----------+--------------+ Right Basilic    Diameter (cm)Depth (cm)   Findings    +-----------------+-------------+----------+--------------+ Prox upper arm       0.42        2.19                   +-----------------+-------------+----------+--------------+ Mid upper arm        0.22        1.65                  +-----------------+-------------+----------+--------------+ Dist upper arm       0.21        1.37                  +-----------------+-------------+----------+--------------+ Antecubital fossa    0.26        0.87     branching    +-----------------+-------------+----------+--------------+ Prox forearm                            not visualized +-----------------+-------------+----------+--------------+ Mid forearm                             not visualized +-----------------+-------------+----------+--------------+ Distal forearm                          not visualized +-----------------+-------------+----------+--------------+ +-----------------+-------------+----------+--------+ Left Cephalic    Diameter (cm)Depth (cm)Findings +-----------------+-------------+----------+--------+ Shoulder             0.33        1.21            +-----------------+-------------+----------+--------+ Prox upper arm       0.28        0.70            +-----------------+-------------+----------+--------+ Mid upper arm        0.30        0.62            +-----------------+-------------+----------+--------+ Dist upper arm       0.31        0.46            +-----------------+-------------+----------+--------+ Antecubital fossa    0.49        0.37            +-----------------+-------------+----------+--------+ Prox forearm         0.23        0.43            +-----------------+-------------+----------+--------+ Mid forearm          0.18  0.28            +-----------------+-------------+----------+--------+ Dist forearm         0.16        0.22            +-----------------+-------------+----------+--------+ +-----------------+-------------+----------+--------------+ Left Basilic     Diameter (cm)Depth (cm)   Findings     +-----------------+-------------+----------+--------------+ Prox upper arm       0.29        1.65                  +-----------------+-------------+----------+--------------+ Mid upper arm                           not visualized +-----------------+-------------+----------+--------------+ Dist upper arm                          not visualized +-----------------+-------------+----------+--------------+ Antecubital fossa                       not visualized +-----------------+-------------+----------+--------------+ Prox forearm                            not visualized +-----------------+-------------+----------+--------------+ Mid forearm                             not visualized +-----------------+-------------+----------+--------------+ Distal forearm                          not visualized +-----------------+-------------+----------+--------------+ *See table(s) above for measurements and observations.  Diagnosing physician: Deitra Mayo MD Electronically signed by Deitra Mayo MD on 08/03/2019 at 8:07:48 AM.    Final     Scheduled Meds: . amLODipine  10 mg Oral Daily  . Chlorhexidine Gluconate Cloth  6 each Topical Q0600  . darbepoetin (ARANESP) injection - DIALYSIS  60 mcg Intravenous Q Wed-HD  . pantoprazole  40 mg Oral Daily  . sodium bicarbonate  1,300 mg Oral BID  . sodium chloride flush  3 mL Intravenous Once   Continuous Infusions: . sodium chloride    . cefUROXime (ZINACEF)  IV    . lactated ringers Stopped (08/02/19 1223)   PRN Meds:.   ASSESMENT:   *   Anemia, FOBT positive.  Recurrent issue 06/30/2019 EGD.  Benign looking duodenal polyp versus prominent papula, biopsy showed foveolar metaplasia/peptic duodenitis. 06/30/2019 colonoscopy showed sigmoid diverticulosis, probable old blood in terminal ileum. 07/08/2018 capsule endoscopy.  Normal 13-hour study.  No bleeding, AVMs or lesions of any sort. 08/02/2019 small bowel enteroscopy:  Prominent ampulla, biopsied.  Benign looking duodenal polyps, biopsied.  Nonbleeding duodenal AVM eradicated with APC.  Nonbleeding AVM in proximal jejunum eradicated with APC. S/p PRBCs x 2.  Hgb 7.1 >> 9.    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  AV fistula placement planned 4/7.  Will begin dialysis this afternoon via temporary dialysis catheter.  *   Thrombocytopenia.     PLAN   *   In future, renal can follow his blood counts at dialysis Does not need office follow-up with GI. Given nature of AVMs he may have recurrent acute on chronic blood loss anemia and require additional enteroscopy or other studies.  Should contact the GI service if he requires admission for this.  *  Continue 1 x daily full  dose PPI    Azucena Freed  08/03/2019, 11:19 AM Phone [phone]

## 2019-08-03 NOTE — Anesthesia Procedure Notes (Signed)
Procedure Name: MAC Date/Time: 08/03/2019 12:43 PM Performed by: Barrington Ellison, CRNA Pre-anesthesia Checklist: Patient identified, Emergency Drugs available, Suction available and Patient being monitored Patient Re-evaluated:Patient Re-evaluated prior to induction Oxygen Delivery Method: Simple face mask

## 2019-08-03 NOTE — Op Note (Signed)
Patient name: Daniel [name] MRN: [id number] DOB: 09/22/1960 Sex: male  08/03/2019 Pre-operative Diagnosis: End-stage renal disease Post-operative diagnosis:  Same Surgeon:  Erlene Quan C. Donzetta Matters, MD Assistant: Ellsworth Lennox, RNFA Procedure Performed: 1.  Right IJ 19 cm tunneled dialysis catheter placement with ultrasound guidance 2.  Creation of left brachial artery to cephalic vein AV fistula  Indications: 59 year old male now with end-stage renal disease in need of permanent and temporary access.  He is indicated for tunneled catheter as well as creation of left arm AV fistula.  Patient is right arm dominant.  Findings: IJ was large compressible.  19 cm catheter was placed with ultrasound and fluoroscopic guidance.  There is a large cephalic vein at the antecubitum that was compressible.  Did have some disease from previous IVs at completion was a very strong thrill and a palpable radial artery pulse the wrist.   Procedure:  The patient was identified in the holding area and taken to the operating room where MAC anesthesia was induced.  A left upper extremity preoperative block was in place and this was tested noted to be intact.  Timeout was called antibiotics were administered.  Ultrasound was used to identify the right internal jugular vein which was noted to be patent and compressible.  The areas anesthetized 1% lidocaine as well as the expected tunnel tract.  I cannulated the vein with direct ultrasound visualization with 18-gauge needle and placed a wire centrally.  I tunneled a 19 cm catheter and trimmed to size.  I serially dilated the wire tract in place introducer sheath under fluoroscopic guidance.  Catheter was placed the SVC atrial junction.  This was done with fluoroscopic guidance.  Was assembled flushed with heparinized saline locked with 1.5 cc of concentrated heparin either port.  Was affixed to the skin with 3-0 nylon suture.  Neck incision was closed with 4 Monocryl dermal was  placed at both sites.  A sterile dressing was placed.  Return my attention left upper extremity.  The block was checked and was noted to be intact.  Ultrasound was used to identify the cephalic vein which was patent and compressible.  Transverse incision was created overlying the vein and the palpable brachial artery pulse.  I dissected out the vein for several centimeters.  I ligated branches between ties.  There was 1 large branch that I oversewed with Vicryl suture as it bled after tying.  I then dissected out the brachial artery was very large and placed a vessel loop around this.  I then clamped the vein marked for orientation transected.  I flushed with heparinized saline and clamped it.  I tied off the distal end of the vein.  I clamped the artery distally and proximally opened longitudinally flushed with heparinized saline by directions.  I then sewed the vein end-to-side with 6-0 Prolene suture.  Prior to completion allowed flushing all directions.  Upon completion there was one side branch that did bleed I oversewed this with 6-0 Prolene suture.  I then had a very strong thrill in the vein confirmed with Doppler a palpable radial artery pulse the wrist also confirmed with Doppler.  Satisfied I irrigated the wound with saline obtain hemostasis and closed in layers of Vicryl and Monocryl.  Dermabond is placed at the level of the skin.  He was awake from anesthesia having tolerated procedure without immediate complication.  Counts were correct at completion.  EBL: 50 cc   [name] C. Donzetta Matters, MD [facility] Office: [phone] Pager: [phone]

## 2019-08-03 NOTE — Progress Notes (Signed)
  Progress Note    08/03/2019 12:03 PM Day of Surgery  Subjective: No overnight issues.  Vitals:   08/03/19 0413 08/03/19 0839  BP: 138/78 (!) 165/90  Pulse: 72 70  Resp: 14 18  Temp: 98.5 F (36.9 C) 98.5 F (36.9 C)  SpO2: 98% 100%    Physical Exam: Awake alert oriented Unlabored respirations Palpable left radial pulse  CBC    Component Value Date/Time   WBC 4.6 08/03/2019 0547   RBC 2.93 (L) 08/03/2019 0547   HGB 9.8 (L) 08/03/2019 0547   HCT 28.9 (L) 08/03/2019 0547   HCT 27.5 (L) 01/23/2019 1014   PLT 102 (L) 08/03/2019 0547   MCV 98.6 08/03/2019 0547   MCH 33.4 08/03/2019 0547   MCHC 33.9 08/03/2019 0547   RDW 20.0 (H) 08/03/2019 0547   LYMPHSABS 1.7 07/24/2019 0618   MONOABS 0.5 07/24/2019 0618   EOSABS 0.1 07/24/2019 0618   BASOSABS 0.0 07/24/2019 0618    BMET    Component Value Date/Time   NA 143 08/03/2019 0547   K 3.1 (L) 08/03/2019 0547   CL 110 08/03/2019 0547   CO2 20 (L) 08/03/2019 0547   GLUCOSE 138 (H) 08/03/2019 0547   BUN 99 (H) 08/03/2019 0547   CREATININE 8.22 (H) 08/03/2019 0547   CALCIUM 8.4 (L) 08/03/2019 0547   CALCIUM 7.9 (L) 08/01/2019 0702   GFRNONAA 6 (L) 08/03/2019 0547   GFRAA 7 (L) 08/03/2019 0547    INR    Component Value Date/Time   INR 1.0 08/03/2019 0547     Intake/Output Summary (Last 24 hours) at 08/03/2019 1203 Last data filed at 08/03/2019 1020 Gross per 24 hour  Intake 480 ml  Output 1500 ml  Net -1020 ml     Assessment:  59 y.o. male is in need of permanent dialysis access.  Plan: OR today for tunneled dialysis catheter and left arm AV fistula versus graft   Ascher Schroepfer C. Donzetta Matters, MD [facility] [place] Office: [phone] Pager: [phone]  08/03/2019 12:03 PM

## 2019-08-03 NOTE — Anesthesia Procedure Notes (Signed)
Anesthesia Regional Block: Supraclavicular block   Pre-Anesthetic Checklist: ,, timeout performed, Correct Patient, Correct Site, Correct Laterality, Correct Procedure, Correct Position, site marked, Risks and benefits discussed,  Surgical consent,  Pre-op evaluation,  At surgeon's request and post-op pain management  Laterality: Left  Prep: chloraprep       Needles:  Injection technique: Single-shot  Needle Type: Echogenic Stimulator Needle     Needle Length: 10cm  Needle Gauge: 20     Additional Needles:   Procedures:,,,, ultrasound used (permanent image in chart),,,,  Narrative:  Start time: 08/03/2019 11:15 AM End time: 08/03/2019 11:25 AM Injection made incrementally with aspirations every 5 mL.  Performed by: Personally  Anesthesiologist: Murvin Natal, MD  Additional Notes: Functioning IV was confirmed and monitors were applied.  A timeout was performed. Sterile prep, hand hygiene and sterile gloves were used. A 1106mm 20ga B Braun echogenic stimulator needle was used. Negative aspiration and negative test dose prior to incremental administration of local anesthetic. The patient tolerated the procedure well.  Ultrasound guidance: relevent anatomy identified, needle position confirmed, local anesthetic spread visualized around nerve(s), vascular puncture avoided.  Image printed for medical record.

## 2019-08-03 NOTE — Progress Notes (Signed)
Patient ID: Daniel [name], male   DOB: 07/11/1960, 59 y.o.   MRN: [id number]   Subjective: HD: 2  [name] was seen this morning on rounds and appeared to be in better spirits today making some jokes. He has not had any dizziness or further melanotic stools. We discussed the plan to start HD today and his upcoming procedure for catheter and fistula placement.  He wanted some clarification of the purpose of catheter v fistula which was explained to him and he expressed understanding. He also wanted information regarding how often he will have to have HD and how long he will be in the hospital. Necessity of 3x weekly HD was explained and he was informed that his length of stay will likely depend on how long it takes to get his insurance set up and an outpatient HD clinic set up for him to go to after discharge. He was informed that it will likely be at minimum a few days. He expressed understanding of this conversation.    Objective:  Vital signs in last 24 hours: Vitals:   08/02/19 1141 08/02/19 1711 08/02/19 2152 08/03/19 0413  BP: (!) 178/78 (!) 171/96 (!) 175/116 138/78  Pulse: (!) 51 73 79 72  Resp: 18 18 16 14   Temp:  98.2 F (36.8 C) 98.2 F (36.8 C) 98.5 F (36.9 C)  TempSrc:  Oral Oral Oral  SpO2: 98% 98% 98% 98%  Weight:   111.6 kg   Height:       Weight change: 0 kg  Intake/Output Summary (Last 24 hours) at 08/03/2019 0700 Last data filed at 08/03/2019 0630 Gross per 24 hour  Intake 680 ml  Output 2225 ml  Net -1545 ml   Physical Exam  Cardiovascular: Normal rate, regular rhythm, normal heart sounds and intact distal pulses.  Respiratory: Effort normal and breath sounds normal.  GI: Soft. Bowel sounds are normal. He exhibits no distension. There is no abdominal tenderness.  Neurological: He is alert.  Skin: Skin is warm and dry.   CBC Latest Ref Rng & Units 08/03/2019 08/02/2019 08/01/2019  WBC 4.0 - 10.5 K/uL 4.6 4.2 4.3  Hemoglobin 13.0 - 17.0 g/dL 9.8(L)  9.0(L) 8.9(L)  Hematocrit 39.0 - 52.0 % 28.9(L) 27.5(L) 26.0(L)  Platelets 150 - 400 K/uL 102(L) 98(L) 110(L)   BMP Latest Ref Rng & Units 08/03/2019 08/02/2019 08/01/2019  Glucose 70 - 99 mg/dL 138(H) 111(H) 118(H)  BUN 6 - 20 mg/dL 99(H) 111(H) 107(H)  Creatinine 0.61 - 1.24 mg/dL 8.22(H) 8.22(H) 8.30(H)  Sodium 135 - 145 mmol/L 143 142 142  Potassium 3.5 - 5.1 mmol/L 3.1(L) 3.2(L) 3.3(L)  Chloride 98 - 111 mmol/L 110 108 105  CO2 22 - 32 mmol/L 20(L) 19(L) 20(L)  Calcium 8.9 - 10.3 mg/dL 8.4(L) 8.3(L) 8.1(L)   INR: 04/07 1.4 04/06 1.4 04/05 1.4  Assessment/Plan:  Principal Problem:   Symptomatic anemia Active Problems:   Hypertension   DM2 (diabetes mellitus, type 2) (HCC)   Orthostatic hypotension   CKD (chronic kidney disease), stage V (HCC)   Acute on chronic anemia   Chronic kidney disease, stage V (HCC)   AVM (arteriovenous malformation) of small bowel, acquired with hemorrhage  Summary:  Daniel [name] is a 59 year old male with a history of CKD stage V now ESRD, DM2, and chronic anemia admitted for symptomatic acute on chronic anemia secondary to GI bleeding and CKD on HD 2.   CKD stage V, now ESRD:  Creatinine has been stable  at 8.22 since yesterday. BUN remains elevated at 99, down from 111 yesterday. The patient is still able to make urine. Nephrology on board; appreciate all recommendations. No recommendation for biopsy as it will not provide diagnostic or therapeutic benefit.  - tunnel catheter and AV fistula placement today - start HD today  - aranesp to start today  - continue sodium bicarbonate 1300 mg BID  Symptomatic anemia, orthostatic hypotension:  Patient's Hgb has been stable since transfusion and AVM ablation. He has also been asymptomatic with no orthostatic symptoms or melanotic stool since 2 days ago. - continue protonix - received ferric gluconate 250 mg yesterday  - aranesp to start today   HTN:  - continue amlodipine 10 mg daily   Diet:  NPO VTE ppx: SCDs, no lovenox due to GI bleed Code:full Dispo:to be determined    LOS: 2 days   Mikael Spray, Medical Student 08/03/2019, 7:00 AM

## 2019-08-03 NOTE — Progress Notes (Signed)
Informed primary RN Angela Nevin at 1700 that HD will be [id number]/82021 per Dr Carolin Sicks

## 2019-08-03 NOTE — Progress Notes (Signed)
[facility] Progress Note   Subjective:    Patient quiet this morning. Examined after nurse changed peripheral IV from R arm to L arm. Appears down and nervous about TDC and LUE AVF placement this morning with dialysis to follow. He noted that he was very hungry/thirsty (NPO since midnight). Reassured patient and asked for questions/concerns but he had none to offer to me. Patient otherwise comfortable with no acute complaints.   Objective Vitals:   08/02/19 1711 08/02/19 2152 08/03/19 0413 08/03/19 0839  BP: (!) 171/96 (!) 175/116 138/78 (!) 165/90  Pulse: 73 79 72 70  Resp: 18 16 14 18   Temp: 98.2 F (36.8 C) 98.2 F (36.8 C) 98.5 F (36.9 C) 98.5 F (36.9 C)  TempSrc: Oral Oral Oral Oral  SpO2: 98% 98% 98% 100%  Weight:  111.6 kg    Height:       Physical Exam General: Male resting in hospital bed while IV is placed. In NAD.  Heart: RRR S1/S2 distinct. No murmurs, gallops, or rubs.  Lungs: CTA anteriorly. No increased work of breathing or use of accessory muscles.  Abdomen: Mildly distended but soft, and nontender.  Extremities: No edema, ischemic changes, or open wounds  Dialysis Access: None.  Filed Weights   08/01/19 2122 08/02/19 0917 08/02/19 2152  Weight: 113.1 kg 113.1 kg 111.6 kg    Intake/Output Summary (Last 24 hours) at 08/03/2019 1058 Last data filed at 08/03/2019 1020 Gross per 24 hour  Intake 480 ml  Output 1500 ml  Net -1020 ml    Additional Objective Labs: Basic Metabolic Panel: Recent Labs  Lab 07/31/19 2033 07/31/19 2033 08/01/19 0702 08/02/19 0713 08/03/19 0547  NA 137   < > 142 142 143  K 2.9*   < > 3.3* 3.2* 3.1*  CL 104   < > 105 108 110  CO2 18*   < > 20* 19* 20*  GLUCOSE 113*   < > 118* 111* 138*  BUN 105*   < > 107* 111* 99*  CREATININE 8.25*   < > 8.30* 8.22* 8.22*  CALCIUM 7.7*   < > 8.1*  7.9* 8.3* 8.4*  PHOS 4.5  --  7.2* 6.4*  --    < > = values in this interval not displayed.   Liver Function  Tests: Recent Labs  Lab 07/31/19 2033 08/01/19 0702 08/02/19 0713  AST  --  111*  --   ALT  --  44  --   ALKPHOS  --  58  --   BILITOT  --  0.6  --   PROT  --  7.0  --   ALBUMIN 2.3* 2.2*  2.2* 2.2*   No results for input(s): LIPASE, AMYLASE in the last 168 hours. CBC: Recent Labs  Lab 07/31/19 1713 07/31/19 1713 08/01/19 0702 08/02/19 0638 08/03/19 0547  WBC 4.3   < > 4.3 4.2 4.6  HGB 7.1*   < > 8.9* 9.0* 9.8*  HCT 21.2*   < > 26.0* 27.5* 28.9*  MCV 102.9*  --  97.7 99.3 98.6  PLT 114*   < > 110* 98* 102*   < > = values in this interval not displayed.   Blood Culture    Component Value Date/Time   SDES ABDOMEN ABSCESS 06/29/2016 1300   SPECREQUEST NONE 06/29/2016 1300   CULT  06/29/2016 1300    MODERATE STAPHYLOCOCCUS LUGDUNENSIS ABUNDANT PEPTOSTREPTOCOCCUS SPECIES ABUNDANT ANAEROBIC GRAM NEGATIVE ROD UNABLE TO FURTHER IDENTIFY. BETA LACTAMASE NEGATIVE Performed at  [facility], [place] [address], [place], [place] [address]    REPTSTATUS 07/06/2016 FINAL 06/29/2016 1300    Cardiac Enzymes: No results for input(s): CKTOTAL, CKMB, CKMBINDEX, TROPONINI in the last 168 hours. CBG: Recent Labs  Lab 07/31/19 1703  GLUCAP 178*   Iron Studies: No results for input(s): IRON, TIBC, TRANSFERRIN, FERRITIN in the last 72 hours. Lab Results  Component Value Date   INR 1.0 08/03/2019   INR 1.0 07/23/2019   INR 1.0 06/27/2019   Studies/Results: VAS [place] UPPER EXT VEIN MAPPING (PRE-OP AVF) -- Result Date: 08/03/2019 See chart for final measurements and observations. Diagnosing physician: Deitra Mayo MD   Medications: . sodium chloride    . cefUROXime (ZINACEF)  IV    . ferric gluconate (FERRLECIT/NULECIT) IV 250 mg (08/03/19 1001)  . lactated ringers Stopped (08/02/19 1223)   . amLODipine  10 mg Oral Daily  . Chlorhexidine Gluconate Cloth  6 each Topical Q0600  . darbepoetin (ARANESP) injection - DIALYSIS  60 mcg Intravenous Q Wed-HD  . pantoprazole   40 mg Oral Daily  . sodium bicarbonate  1,300 mg Oral BID  . sodium chloride flush  3 mL Intravenous Once    Assessment/Plan: 59 y.o M with PMH of CKD V, DMT2, ACD, Hep C, and HTN. Admitted 07/31/19 with symptomatic anemia s/p dizziness and ground-level fall with BUN:Cr of 104:8.06. Patient previously evaluated by Dr. Augustin Coupe for significant progression of CKD with intended follow up with [facility].  1. 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 Good UOP during admission.  - Recent workup for renal failure was unremarkable. CT abd 07/17/19 showed no evidence of obstruction. Renal US 07/01/19 with medical renal disease. UA + for proteinuria on admission. - BUN and Cr persistently elevated to 99 and 8.22. EPO 14.3. Protein:Cr pending.  - Continue sodium bicarb 1.3 g PO BID - Vein mapping completed yesterday. Vasc Surg placing TDC and LUE AVF today, 08/03/19, to start HD this afternoon.  - Social worker consulted to arrange for OP HD.   2. Anemia due to GI bleed and CKD - GI blood loss with feraheme administered int he past. Recent transfusions in Dec 2020 and twice in Mar 2020. GI workup with EGD, colonoscopy, and capsule study Mar 2020 with no confirmed etiology. Hgb 7.1 on admission. T sat 22%.  - Upper EGD repeated yesterday, 08/02/19, revealed non-bleeding ulcer.  - Will start iron and ESA during HD today  3. Hypokalemia - Remains low at 3.1 today. Replete with 40 mg KCl today.   4. Secondary hyperparathyroidism - PTH 217, Phos 6. Expect to improve with initiation of dialysis.   5. HTN/volume - Last BP 165/90. Up to 175/116 last night following initiation of amlodipine 10 mg yesterday afternoon, 08/02/19. Expect BP to improve with HD. Continue to monitor. May consider restarting home clonidine.  6. History of untreated Hep C. 3.7 million 12/2018  7. Diabetes. Per primary team.    Nephrology will continue to follow along in the care of this patient.   Marisa Sprinkles, PA-S2 [facility]

## 2019-08-03 NOTE — Anesthesia Postprocedure Evaluation (Signed)
Anesthesia Post Note  Patient: Daniel [name]  Procedure(s) Performed: LEFT ARM ARTERIOVENOUS (AV) CEPHALIC  FISTULA CREATION (Left Arm Lower) INSERTION OF DIALYSIS CATHETER (Right )     Patient location during evaluation: PACU Anesthesia Type: Regional Level of consciousness: awake and alert Pain management: pain level controlled Vital Signs Assessment: post-procedure vital signs reviewed and stable Respiratory status: spontaneous breathing, nonlabored ventilation, respiratory function stable and patient connected to nasal cannula oxygen Cardiovascular status: stable and blood pressure returned to baseline Postop Assessment: no apparent nausea or vomiting Anesthetic complications: no    Last Vitals:  Vitals:   08/03/19 1516 08/03/19 1600  BP: 139/86   Pulse: 90   Resp: 18   Temp: (!) 39.3 C 36.6 C  SpO2: 99%     Last Pain:  Vitals:   08/03/19 1600  TempSrc: Oral  PainSc:                  [name] P Kamiryn Bezanson

## 2019-08-03 NOTE — Anesthesia Preprocedure Evaluation (Addendum)
Anesthesia Evaluation  Patient identified by MRN, date of birth, ID band Patient awake    Reviewed: Allergy & Precautions, NPO status , Patient's Chart, lab work & pertinent test results  Airway Mallampati: III  TM Distance: >3 FB Neck ROM: Full    Dental  (+) Poor Dentition, Missing   Pulmonary Current Smoker and Patient abstained from smoking.,    Pulmonary exam normal breath sounds clear to auscultation       Cardiovascular hypertension, Pt. on medications Normal cardiovascular exam Rhythm:Regular Rate:Normal  ECG: rate 76   Neuro/Psych PSYCHIATRIC DISORDERS ICH (intracerebral hemorrhage)  negative neurological ROS     GI/Hepatic negative GI ROS, (+)     substance abuse  ,   Endo/Other  diabetes  Renal/GU ESRFRenal disease     Musculoskeletal negative musculoskeletal ROS (+)   Abdominal (+) + obese,   Peds  Hematology  (+) anemia ,   Anesthesia Other Findings END STAGE RENAL DISEASE  Reproductive/Obstetrics                           Anesthesia Physical Anesthesia Plan  ASA: III  Anesthesia Plan: Regional   Post-op Pain Management:    Induction: Intravenous  PONV Risk Score and Plan: 0 and Ondansetron, Dexamethasone, Propofol infusion and Treatment may vary due to age or medical condition  Airway Management Planned: Nasal Cannula  Additional Equipment:   Intra-op Plan:   Post-operative Plan:   Informed Consent: I have reviewed the patients History and Physical, chart, labs and discussed the procedure including the risks, benefits and alternatives for the proposed anesthesia with the patient or authorized representative who has indicated his/her understanding and acceptance.     Dental advisory given  Plan Discussed with: CRNA  Anesthesia Plan Comments:      Anesthesia Quick Evaluation

## 2019-08-03 NOTE — Progress Notes (Signed)
Patient for left AV fistula today. Upon reviewing chart, found peripheral IV in left arm. Angela Nevin, RN made aware and stated she would place new IV in non-operative arm.

## 2019-08-04 ENCOUNTER — Telehealth (INDEPENDENT_AMBULATORY_CARE_PROVIDER_SITE_OTHER): Payer: Self-pay | Admitting: Primary Care

## 2019-08-04 DIAGNOSIS — N186 End stage renal disease: Secondary | ICD-10-CM

## 2019-08-04 DIAGNOSIS — B1920 Unspecified viral hepatitis C without hepatic coma: Secondary | ICD-10-CM

## 2019-08-04 DIAGNOSIS — Z992 Dependence on renal dialysis: Secondary | ICD-10-CM

## 2019-08-04 LAB — RENAL FUNCTION PANEL
Albumin: 2.4 g/dL — ABNORMAL LOW (ref 3.5–5.0)
Anion gap: 13 (ref 5–15)
BUN: 88 mg/dL — ABNORMAL HIGH (ref 6–20)
CO2: 19 mmol/L — ABNORMAL LOW (ref 22–32)
Calcium: 8.3 mg/dL — ABNORMAL LOW (ref 8.9–10.3)
Chloride: 107 mmol/L (ref 98–111)
Creatinine, Ser: 7.92 mg/dL — ABNORMAL HIGH (ref 0.61–1.24)
GFR calc Af Amer: 8 mL/min — ABNORMAL LOW (ref >60)
GFR calc non Af Amer: 7 mL/min — ABNORMAL LOW (ref >60)
Glucose, Bld: 121 mg/dL — ABNORMAL HIGH (ref 70–99)
Phosphorus: 6.3 mg/dL — ABNORMAL HIGH (ref 2.5–4.6)
Potassium: 3.7 mmol/L (ref 3.5–5.1)
Sodium: 139 mmol/L (ref 135–145)

## 2019-08-04 LAB — CBC
HCT: 27.5 % — ABNORMAL LOW (ref 39.0–52.0)
Hemoglobin: 8.9 g/dL — ABNORMAL LOW (ref 13.0–17.0)
MCH: 32.6 pg (ref 26.0–34.0)
MCHC: 32.4 g/dL (ref 30.0–36.0)
MCV: 100.7 fL — ABNORMAL HIGH (ref 80.0–100.0)
Platelets: 97 10*3/uL — ABNORMAL LOW (ref 150–400)
RBC: 2.73 MIL/uL — ABNORMAL LOW (ref 4.22–5.81)
RDW: 19.8 % — ABNORMAL HIGH (ref 11.5–15.5)
WBC: 6 10*3/uL (ref 4.0–10.5)
nRBC: 0 % (ref 0.0–0.2)

## 2019-08-04 LAB — HEPATITIS B SURFACE ANTIGEN: Hepatitis B Surface Ag: NONREACTIVE

## 2019-08-04 LAB — HEPATITIS B CORE ANTIBODY, TOTAL: Hep B Core Total Ab: REACTIVE — AB

## 2019-08-04 LAB — HEPATITIS B SURFACE ANTIBODY,QUALITATIVE: Hep B S Ab: REACTIVE — AB

## 2019-08-04 MED ORDER — DARBEPOETIN ALFA 60 MCG/0.3ML IJ SOSY: 60.0000 ug | PREFILLED_SYRINGE | INTRAMUSCULAR | Status: DC

## 2019-08-04 MED ORDER — HYDROMORPHONE HCL 2 MG PO TABS
1.0000 mg | ORAL_TABLET | ORAL | Status: DC | PRN
  Administered 2019-08-04 – 2019-08-05 (×3): 1 mg via ORAL
  Filled 2019-08-04 (×3): qty 1

## 2019-08-04 MED ORDER — SODIUM CHLORIDE 0.9 % IV SOLN: 100.0000 mL | INTRAVENOUS | Status: DC | PRN

## 2019-08-04 MED ORDER — PENTAFLUOROPROP-TETRAFLUOROETH EX AERO: 1.0000 "application " | INHALATION_SPRAY | CUTANEOUS | Status: DC | PRN

## 2019-08-04 MED ORDER — ALTEPLASE 2 MG IJ SOLR: 2.0000 mg | Freq: Once | INTRAMUSCULAR | Status: DC | PRN

## 2019-08-04 MED ORDER — CHLORHEXIDINE GLUCONATE CLOTH 2 % EX PADS: 6.0000 | MEDICATED_PAD | Freq: Every day | CUTANEOUS | Status: DC

## 2019-08-04 MED ORDER — HYDROMORPHONE HCL 2 MG PO TABS
1.0000 mg | ORAL_TABLET | Freq: Three times a day (TID) | ORAL | Status: DC | PRN
  Administered 2019-08-04: 1 mg via ORAL
  Filled 2019-08-04: qty 1

## 2019-08-04 MED ORDER — HEPARIN SODIUM (PORCINE) 1000 UNIT/ML DIALYSIS: 1000.0000 [IU] | INTRAMUSCULAR | Status: DC | PRN

## 2019-08-04 MED ORDER — HEPARIN SODIUM (PORCINE) 1000 UNIT/ML IJ SOLN
INTRAMUSCULAR | Status: AC
  Filled 2019-08-04: qty 3

## 2019-08-04 MED ORDER — LIDOCAINE-PRILOCAINE 2.5-2.5 % EX CREA: 1.0000 "application " | TOPICAL_CREAM | CUTANEOUS | Status: DC | PRN

## 2019-08-04 MED ORDER — DARBEPOETIN ALFA 60 MCG/0.3ML IJ SOSY: 60.0000 ug | PREFILLED_SYRINGE | INTRAMUSCULAR | Status: AC

## 2019-08-04 MED ORDER — LIDOCAINE HCL (PF) 1 % IJ SOLN: 5.0000 mL | INTRAMUSCULAR | Status: DC | PRN

## 2019-08-04 NOTE — Progress Notes (Signed)
Referral for OP HD treatment submitted to [facility] Admissions to request treatment for ESRD at [facility], which is the clinic closest to patient's home. Patient reports that he drives.  Patient is uninsured, which may delay approval process, however, he reports that, "I have worked all my life and my Social Security records will show that." Patient is fixated on his, now, inability to work and bills he can no longer pay. Navigator encouraged him to take things one step at a time and to focus on his physical health in order to evaluate what type of work he may be capable of doing in the future, stating that this situation may not mean that he is completely unable to make an income, and that he may be able to inquire about Disability income. Navigator thinks he will be able to qualify for Medicare given his work hx and dx of ESRD, which will cover OP HD, but will need [employer] Department to financially clear patient before he can be accepted for treatment in the clinic. Renal Navigator will follow closely.  Alphonzo Cruise, [place] Renal Navigator [phone]

## 2019-08-04 NOTE — Progress Notes (Signed)
Daniel [name] Progress Note   Subjective:    Patient seen during first HD treatment. Completed entire 2 hours with no complaints. He is feeling well and in better spirits than yesterday. He endorses good appetite and some L arm soreness from his fistula. He denies SOB, CP, N/V, dysuria. He made 450 mL of urine yesterday.   Objective: Vitals:   08/04/19 0730 08/04/19 0800 08/04/19 0830 08/04/19 1041  BP: (!) 132/50 (!) 161/100 (!) 158/83 (!) 150/86  Pulse: 83 90 89 88  Resp: 15 15 17 18   Temp:    98.4 F (36.9 C)  TempSrc:    Oral  SpO2:    100%  Weight:      Height:       Physical Exam: General:Male resting in hospital bed completing HD treatment. NAD.  Heart:RRR S1/S2 distinct. No murmurs, gallops, or rubs.  Lungs: CTA anteriorly. No increased work of breathing or use of accessory muscles. Abdomen:Mildly distended but soft, and nontender. Extremities:No edema or ischemic changes. L arm incision c/d/i.  Dialysis Access: RIJ TDC, L brachial artery to cephalic vein fistula with palpable thrill.   Dialysis Orders:    Filed Weights   08/03/19 1159 08/03/19 2205 08/04/19 0652  Weight: 111.6 kg 112.8 kg 113.9 kg    Intake/Output Summary (Last 24 hours) at 08/04/2019 1108 Last data filed at 08/04/2019 0900 Gross per 24 hour  Intake 1720 ml  Output 10 ml  Net 1710 ml    Additional Objective Labs: Basic Metabolic Panel: Recent Labs  Lab 08/01/19 0702 08/01/19 0702 08/02/19 0713 08/03/19 0547 08/04/19 0712  NA 142   < > 142 143 139  K 3.3*   < > 3.2* 3.1* 3.7  CL 105   < > 108 110 107  CO2 20*   < > 19* 20* 19*  GLUCOSE 118*   < > 111* 138* 121*  BUN 107*   < > 111* 99* 88*  CREATININE 8.30*   < > 8.22* 8.22* 7.92*  CALCIUM 8.1*  7.9*   < > 8.3* 8.4* 8.3*  PHOS 7.2*  --  6.4*  --  6.3*   < > = values in this interval not displayed.   Liver Function Tests: Recent Labs  Lab 08/01/19 0702 08/02/19 0713 08/04/19 0712  AST 111*  --   --   ALT  44  --   --   ALKPHOS 58  --   --   BILITOT 0.6  --   --   PROT 7.0  --   --   ALBUMIN 2.2*  2.2* 2.2* 2.4*   No results for input(s): LIPASE, AMYLASE in the last 168 hours. CBC: Recent Labs  Lab 07/31/19 1713 07/31/19 1713 08/01/19 0702 08/01/19 0702 08/02/19 0638 08/03/19 0547 08/04/19 0713  WBC 4.3   < > 4.3   < > 4.2 4.6 6.0  HGB 7.1*   < > 8.9*   < > 9.0* 9.8* 8.9*  HCT 21.2*   < > 26.0*   < > 27.5* 28.9* 27.5*  MCV 102.9*  --  97.7  --  99.3 98.6 100.7*  PLT 114*   < > 110*   < > 98* 102* 97*   < > = values in this interval not displayed.   Blood Culture    Component Value Date/Time   SDES ABDOMEN ABSCESS 06/29/2016 1300   SPECREQUEST NONE 06/29/2016 1300   CULT  06/29/2016 1300    MODERATE STAPHYLOCOCCUS LUGDUNENSIS ABUNDANT PEPTOSTREPTOCOCCUS  SPECIES ABUNDANT ANAEROBIC GRAM NEGATIVE ROD UNABLE TO FURTHER IDENTIFY. BETA LACTAMASE NEGATIVE Performed at [facility], [place] [address] [place], [place] [address]    REPTSTATUS 07/06/2016 FINAL 06/29/2016 1300    Cardiac Enzymes: No results for input(s): CKTOTAL, CKMB, CKMBINDEX, TROPONINI in the last 168 hours. CBG: Recent Labs  Lab 07/31/19 1703 08/03/19 1205 08/03/19 1413  GLUCAP 178* 121* 171*   Iron Studies: No results for input(s): IRON, TIBC, TRANSFERRIN, FERRITIN in the last 72 hours. Lab Results  Component Value Date   INR 1.0 08/03/2019   INR 1.0 07/23/2019   INR 1.0 06/27/2019   Studies/Results: DG Chest Port 1 View  Result Date: 08/03/2019 CLINICAL DATA:  Status post dialysis catheter insertion. EXAM: PORTABLE CHEST 1 VIEW COMPARISON:  Radiograph 07/23/2019 FINDINGS: Right internal jugular dialysis catheter tip in the lower SVC. No pneumothorax. Unchanged heart size and mediastinal contours, mild cardiomegaly. Aortic atherosclerosis. Vascular congestion with suggestion of mild pulmonary edema. No confluent airspace disease or large pleural effusion. IMPRESSION: 1. Tip of the right internal  jugular dialysis catheter in the mid-lower SVC. No pneumothorax. 2. Stable cardiomegaly. Vascular congestion with suggestion of mild pulmonary edema. Aortic Atherosclerosis (ICD10-I70.0). Electronically Signed   By: Keith Rake M.D.   On: 08/03/2019 16:42   DG Fluoro Guide CV Line-No Report  Result Date: 08/03/2019 Fluoroscopy was utilized by the requesting physician.  No radiographic interpretation.   VAS [place] UPPER EXT VEIN MAPPING (PRE-OP AVF)  Result Date: 08/03/2019 UPPER EXTREMITY VEIN MAPPING  Indications: Pre-access. Performing Technologist: Abram Sander RVS  Examination Guidelines: A complete evaluation includes B-mode imaging, spectral Doppler, color Doppler, and power Doppler as needed of all accessible portions of each vessel. Bilateral testing is considered an integral part of a complete examination. Limited examinations for reoccurring indications may be performed as noted. +-----------------+-------------+----------+--------+ Right Cephalic   Diameter (cm)Depth (cm)Findings +-----------------+-------------+----------+--------+ Shoulder             0.17        1.34            +-----------------+-------------+----------+--------+ Prox upper arm       0.16        0.96            +-----------------+-------------+----------+--------+ Mid upper arm        0.16        0.50            +-----------------+-------------+----------+--------+ Dist upper arm       0.16        0.43            +-----------------+-------------+----------+--------+ Antecubital fossa    0.22        0.23            +-----------------+-------------+----------+--------+ Prox forearm         0.24        0.79            +-----------------+-------------+----------+--------+ Mid forearm          0.14        0.50            +-----------------+-------------+----------+--------+ Dist forearm         0.17        0.28            +-----------------+-------------+----------+--------+  +-----------------+-------------+----------+--------------+ Right Basilic    Diameter (cm)Depth (cm)   Findings    +-----------------+-------------+----------+--------------+ Prox upper arm       0.42  2.19                  +-----------------+-------------+----------+--------------+ Mid upper arm        0.22        1.65                  +-----------------+-------------+----------+--------------+ Dist upper arm       0.21        1.37                  +-----------------+-------------+----------+--------------+ Antecubital fossa    0.26        0.87     branching    +-----------------+-------------+----------+--------------+ Prox forearm                            not visualized +-----------------+-------------+----------+--------------+ Mid forearm                             not visualized +-----------------+-------------+----------+--------------+ Distal forearm                          not visualized +-----------------+-------------+----------+--------------+ +-----------------+-------------+----------+--------+ Left Cephalic    Diameter (cm)Depth (cm)Findings +-----------------+-------------+----------+--------+ Shoulder             0.33        1.21            +-----------------+-------------+----------+--------+ Prox upper arm       0.28        0.70            +-----------------+-------------+----------+--------+ Mid upper arm        0.30        0.62            +-----------------+-------------+----------+--------+ Dist upper arm       0.31        0.46            +-----------------+-------------+----------+--------+ Antecubital fossa    0.49        0.37            +-----------------+-------------+----------+--------+ Prox forearm         0.23        0.43            +-----------------+-------------+----------+--------+ Mid forearm          0.18        0.28            +-----------------+-------------+----------+--------+ Dist  forearm         0.16        0.22            +-----------------+-------------+----------+--------+ +-----------------+-------------+----------+--------------+ Left Basilic     Diameter (cm)Depth (cm)   Findings    +-----------------+-------------+----------+--------------+ Prox upper arm       0.29        1.65                  +-----------------+-------------+----------+--------------+ Mid upper arm                           not visualized +-----------------+-------------+----------+--------------+ Dist upper arm                          not visualized +-----------------+-------------+----------+--------------+ Antecubital fossa  not visualized +-----------------+-------------+----------+--------------+ Prox forearm                            not visualized +-----------------+-------------+----------+--------------+ Mid forearm                             not visualized +-----------------+-------------+----------+--------------+ Distal forearm                          not visualized +-----------------+-------------+----------+--------------+ *See table(s) above for measurements and observations.  Diagnosing physician: Deitra Mayo MD Electronically signed by Deitra Mayo MD on 08/03/2019 at 8:07:48 AM.    Final     Medications: . sodium chloride    . sodium chloride 10 mL/hr at 08/03/19 1201  . lactated ringers Stopped (08/02/19 1223)   . amLODipine  10 mg Oral Daily  . Chlorhexidine Gluconate Cloth  6 each Topical Q0600  . darbepoetin (ARANESP) injection - DIALYSIS  60 mcg Intravenous To Hemo  . [START ON 08/12/2019] darbepoetin (ARANESP) injection - DIALYSIS  60 mcg Intravenous Q Fri-HD  . heparin      . pantoprazole  40 mg Oral Daily  . sodium chloride flush  3 mL Intravenous Once    Assessment/Plan: 59 y.o M with PMH of CKD V, DMT2, ACD, Hep C, and HTN. Admitted 07/31/19 with symptomatic anemia s/p dizziness and  ground-level fall with BUN:Cr of 104:8.06. Patient previously evaluated by Dr. Augustin Coupe for significant progression of CKD with intended follow up with [facility]. RIJ TDC and L brachial artery/cephalic vein fistula placed 08/04/19. First HD treatment 08/04/19.   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 CT abd 07/17/19 showed no evidence of obstruction. Renal US 07/01/19 with medical renal disease. UA + for proteinuria on admission. - Cr down from 8.22 to 7.92 today. EPO 14.3. Protein:Cr pending.  - Continue sodium bicarb 1.3 g PO BID - Vasc Surg placing TDC and LUE AVF yesterday, 08/03/19. Patient completed 2 hours of HD today, 08/04/19, with no problems. Recheck renal function panel.  - Case manager saw patient today to discuss financial counselor. Renal navigator working on OP HD.    2.Anemiadue to GI bleed and CKD - GI blood loss with feraheme administered int he past. Recent transfusions in Dec 2020 and twice in Mar 2020. GI workup with EGD, colonoscopy, and capsule study Mar 2020 with no confirmed etiology. Hgb 7.1 on admission. T sat 22%.  - Upper EGD repeated yesterday, 08/02/19, revealed non-bleeding ulcer.  - Iron and ESA started with HD treatment today.   3. Hypokalemia - 3.7 this morning - Repleted with HD. Repeat renal function panel in the morning.   4. Secondary hyperparathyroidism - PTH 217, Phos 6. Expect to improve with initiation of dialysis.  5. HTN/volume -  Expect BP to improve with HD. Continue to monitor. May consider restarting home clonidine.  6. History of untreated Hep C.3.7 million 12/2018  7. Diabetes. Per primary team.   Nephrology will continue to follow along in the care of this patient.   Marisa Sprinkles, PA-S2 [facility]

## 2019-08-04 NOTE — Progress Notes (Addendum)
  Progress Note    08/04/2019 7:22 AM 1 Day Post-Op  Subjective:  Soreness L arm incision   Vitals:   08/03/19 2205 08/04/19 0543  BP: 134/82 (!) 148/96  Pulse: 87 77  Resp: 16 16  Temp: 97.8 F (36.6 C) 98.3 F (36.8 C)  SpO2: 100% 97%   Physical Exam: Lungs:  Non labored Incisions:  L arm incision c/d/i Extremities:  Palpable thrill L arm BC fistula; palpable L radial pulse Neurologic: A&O  CBC    Component Value Date/Time   WBC 4.6 08/03/2019 0547   RBC 2.93 (L) 08/03/2019 0547   HGB 9.8 (L) 08/03/2019 0547   HCT 28.9 (L) 08/03/2019 0547   HCT 27.5 (L) 01/23/2019 1014   PLT 102 (L) 08/03/2019 0547   MCV 98.6 08/03/2019 0547   MCH 33.4 08/03/2019 0547   MCHC 33.9 08/03/2019 0547   RDW 20.0 (H) 08/03/2019 0547   LYMPHSABS 1.7 07/24/2019 0618   MONOABS 0.5 07/24/2019 0618   EOSABS 0.1 07/24/2019 0618   BASOSABS 0.0 07/24/2019 0618    BMET    Component Value Date/Time   NA 143 08/03/2019 0547   K 3.1 (L) 08/03/2019 0547   CL 110 08/03/2019 0547   CO2 20 (L) 08/03/2019 0547   GLUCOSE 138 (H) 08/03/2019 0547   BUN 99 (H) 08/03/2019 0547   CREATININE 8.22 (H) 08/03/2019 0547   CALCIUM 8.4 (L) 08/03/2019 0547   CALCIUM 7.9 (L) 08/01/2019 0702   GFRNONAA 6 (L) 08/03/2019 0547   GFRAA 7 (L) 08/03/2019 0547    INR    Component Value Date/Time   INR 1.0 08/03/2019 0547     Intake/Output Summary (Last 24 hours) at 08/04/2019 0722 Last data filed at 08/04/2019 0600 Gross per 24 hour  Intake 1420 ml  Output 460 ml  Net 960 ml     Assessment/Plan:  59 y.o. male is s/p L brachiocephalic fistula 1 Day Post-Op   Patent L arm fistula without steal symptoms Check fistula duplex in 4-6 weeks; office will call to arrange [facility] for d/c from vascular standpoint   Dagoberto Ligas, PA-C [facility] [phone] 08/04/2019 7:22 AM   I have independently interviewed and examined patient and agree with PA assessment and plan above.   Kadia Abaya  C. Donzetta Matters, MD [facility] [place] Office: [phone] Pager: [phone]

## 2019-08-04 NOTE — Progress Notes (Signed)
Patient ID: Daniel [name], male   DOB: December 18, 1960, 59 y.o.   MRN: [id number]   Subjective: HD: 3   [name] was seen this morning during id first HD session. He said that he feels fine and that his procedure went well yesterday and HD today went well too. He denies any abdominal pain or further melanotic stool. He did complain of an abnormal urine stream saying that "it goes everywhere" and is not a normal straight stream, which is new for him. He denies any dysuria, hesitancy, or changes in frequency.  He is very concerned about when he will be able to leave the hospital as he has bills he needs to pay and no family in the area who can help him. We informed him that he will have to be here until outpatient dialysis can be set up for him and he expressed understanding of this. We also advised him to talk to his case manager and financial advisor about these issues to see if they could assist him further.   Objective:  Vital signs in last 24 hours: Vitals:   08/03/19 1516 08/03/19 1600 08/03/19 2205 08/04/19 0543  BP: 139/86  134/82 (!) 148/96  Pulse: 90  87 77  Resp: 18  16 16   Temp: (!) 102.8 F (39.3 C) 97.8 F (36.6 C) 97.8 F (36.6 C) 98.3 F (36.8 C)  TempSrc: Oral Oral Oral Oral  SpO2: 99%  100% 97%  Weight:   112.8 kg   Height:       Weight change: -1.5 kg  Intake/Output Summary (Last 24 hours) at 08/04/2019 0711 Last data filed at 08/04/2019 0600 Gross per 24 hour  Intake 1420 ml  Output 460 ml  Net 960 ml   Physical Exam  Cardiovascular: Normal rate, regular rhythm and normal heart sounds.  Respiratory: Effort normal and breath sounds normal.  GI: Soft. There is no abdominal tenderness.  Neurological: He is alert.  Skin: Skin is warm and dry.   CBC Latest Ref Rng & Units 08/04/2019 08/03/2019 08/02/2019  WBC 4.0 - 10.5 K/uL 6.0 4.6 4.2  Hemoglobin 13.0 - 17.0 g/dL 8.9(L) 9.8(L) 9.0(L)  Hematocrit 39.0 - 52.0 % 27.5(L) 28.9(L) 27.5(L)  Platelets 150 - 400 K/uL  97(L) 102(L) 98(L)   BMP Latest Ref Rng & Units 08/04/2019 08/03/2019 08/02/2019  Glucose 70 - 99 mg/dL 121(H) 138(H) 111(H)  BUN 6 - 20 mg/dL 88(H) 99(H) 111(H)  Creatinine 0.61 - 1.24 mg/dL 7.92(H) 8.22(H) 8.22(H)  Sodium 135 - 145 mmol/L 139 143 142  Potassium 3.5 - 5.1 mmol/L 3.7 3.1(L) 3.2(L)  Chloride 98 - 111 mmol/L 107 110 108  CO2 22 - 32 mmol/L 19(L) 20(L) 19(L)  Calcium 8.9 - 10.3 mg/dL 8.3(L) 8.4(L) 8.3(L)  Phosphorus:  6.3 Albumin:  2.4 GFR:   8 Anion gap: 13  Assessment/Plan:  Principal Problem:   Symptomatic anemia Active Problems:   Hypertension   DM2 (diabetes mellitus, type 2) (HCC)   Orthostatic hypotension   CKD (chronic kidney disease), stage V (HCC)   Acute on chronic anemia   Chronic kidney disease, stage V (HCC)   AVM (arteriovenous malformation) of small bowel, acquired with hemorrhage  Summary:  Daniel [name] is a 59 year old male with a history of CKD V, now ESRD, type II DM, and HCV without cirrhosis who was admitted for acute on chronic anemia and progression of CKD on hospital day 3.  Symptomatic anemia, orthostatic hypotension: Patient had enteroscopy with ablation of two  AVMs, likely the source of his GI bleeding and symptomatic anemia. During the procedure biopsies were taken of the ampulla as it was protuberant on exam. Biopsies showed adenomatous changes that will require outpatient follow up with GI. Otherwise, the patient's hemoglobin has remained stable and he is no longer symptomatic.   - follow up outpatient GI appointment with Dr. Havery Moros on 09/13/2019 at 9:50 AM for side viewing duodenoscope for further evaluation of adenomatous changes identified on biopsy  - continue protonix - will need close monitoring of Hgb as ESRD predisposes him to further AVMs - iron and aransep during HD  CKD V, now ESRD:  The patient had a R IJ tunnel catheter and L brachial to cephalic AV fistula placed yesterday without complication. Will initiate  dialysis today.Social worker consulted to arrange outpatient HD. Nephrology following; we appreciate all recommendations.   -  Discontinued Oxycodone 325 mg Q6H PRN given ESRD - started Dilaudid 1 mg PO Q8H PRN  - continue sodium bircarbonate 1300 mg BID  HTN:  The patient continues to have HTN despite restarting amlodipine. Expect BP to improve following HD. Will monitor for now.   - continue amlodipine - restart home clonidine if BP not improved after a few days of HD  Diet: Renal diet, fluid restricted 1200 mL  VTE ppx: SCDs, no lovenox due to GI bleed Code:full Dispo:pending insurance and outpatient HD setup      LOS: 3 days   Mikael Spray, Medical Student 08/04/2019, 7:11 AM

## 2019-08-04 NOTE — TOC Progression Note (Signed)
Transition of Care [facility]) - Progression Note    Patient Details  Name: Daniel [name] MRN: [id number] Date of Birth: 1960/10/14  Transition of [facility]) CM/SW Contact  Bartholomew Crews, RN Phone Number: [phone] 08/04/2019, 11:05 AM  Clinical Narrative:     Spoke with patient at the bedside following first HD treatment. Discussed financial counselor following up with paperwork today. Renal navigator working on outpatient HD. TOC following for transition needs.    Expected Discharge Plan: Home/Self Care Barriers to Discharge: Continued Medical Work up  Expected Discharge Plan and Services Expected Discharge Plan: Home/Self Care In-house Referral: Development worker, community, Clinical Social Work Discharge Planning Services: CM Consult   Living arrangements for the past 2 months: Single Family Home                 DME Arranged: N/A DME Agency: NA       HH Arranged: NA HH Agency: NA         Social Determinants of Health (SDOH) Interventions    Readmission Risk Interventions Readmission Risk Prevention Plan 07/07/2019  Transportation Screening Complete  PCP or Specialist Appt within 3-5 Days Complete  HRI or [place] Complete  Social Work Consult for [place] Planning/Counseling Complete  Palliative Care Screening Not Applicable  Medication Review [employer]) Complete  Some recent data might be hidden

## 2019-08-05 DIAGNOSIS — K5520 Angiodysplasia of colon without hemorrhage: Secondary | ICD-10-CM

## 2019-08-05 DIAGNOSIS — E876 Hypokalemia: Secondary | ICD-10-CM

## 2019-08-05 LAB — CBC
HCT: 27.5 % — ABNORMAL LOW (ref 39.0–52.0)
Hemoglobin: 9.1 g/dL — ABNORMAL LOW (ref 13.0–17.0)
MCH: 33 pg (ref 26.0–34.0)
MCHC: 33.1 g/dL (ref 30.0–36.0)
MCV: 99.6 fL (ref 80.0–100.0)
Platelets: 80 10*3/uL — ABNORMAL LOW (ref 150–400)
RBC: 2.76 MIL/uL — ABNORMAL LOW (ref 4.22–5.81)
RDW: 19.4 % — ABNORMAL HIGH (ref 11.5–15.5)
WBC: 5.3 10*3/uL (ref 4.0–10.5)
nRBC: 0 % (ref 0.0–0.2)

## 2019-08-05 LAB — RENAL FUNCTION PANEL
Albumin: 2.4 g/dL — ABNORMAL LOW (ref 3.5–5.0)
Anion gap: 15 (ref 5–15)
BUN: 69 mg/dL — ABNORMAL HIGH (ref 6–20)
CO2: 21 mmol/L — ABNORMAL LOW (ref 22–32)
Calcium: 8.5 mg/dL — ABNORMAL LOW (ref 8.9–10.3)
Chloride: 104 mmol/L (ref 98–111)
Creatinine, Ser: 7.55 mg/dL — ABNORMAL HIGH (ref 0.61–1.24)
GFR calc Af Amer: 8 mL/min — ABNORMAL LOW (ref >60)
GFR calc non Af Amer: 7 mL/min — ABNORMAL LOW (ref >60)
Glucose, Bld: 139 mg/dL — ABNORMAL HIGH (ref 70–99)
Phosphorus: 6.1 mg/dL — ABNORMAL HIGH (ref 2.5–4.6)
Potassium: 3.3 mmol/L — ABNORMAL LOW (ref 3.5–5.1)
Sodium: 140 mmol/L (ref 135–145)

## 2019-08-05 MED ORDER — HEPARIN SODIUM (PORCINE) 1000 UNIT/ML IJ SOLN
INTRAMUSCULAR | Status: AC
  Filled 2019-08-05: qty 4

## 2019-08-05 MED ORDER — DARBEPOETIN ALFA 60 MCG/0.3ML IJ SOSY
60.0000 ug | PREFILLED_SYRINGE | INTRAMUSCULAR | Status: DC
  Administered 2019-08-05: 60 ug via INTRAVENOUS
  Filled 2019-08-05: qty 0.3

## 2019-08-05 MED ORDER — LIDOCAINE HCL (PF) 1 % IJ SOLN: 5.0000 mL | INTRAMUSCULAR | Status: DC | PRN

## 2019-08-05 MED ORDER — SODIUM CHLORIDE 0.9 % IV SOLN
125.0000 mg | INTRAVENOUS | Status: DC
  Administered 2019-08-05: 125 mg via INTRAVENOUS
  Filled 2019-08-05 (×3): qty 10

## 2019-08-05 MED ORDER — SODIUM CHLORIDE 0.9 % IV SOLN: 100.0000 mL | INTRAVENOUS | Status: DC | PRN

## 2019-08-05 MED ORDER — HEPARIN SODIUM (PORCINE) 1000 UNIT/ML DIALYSIS: 1000.0000 [IU] | INTRAMUSCULAR | Status: DC | PRN

## 2019-08-05 MED ORDER — LIDOCAINE-PRILOCAINE 2.5-2.5 % EX CREA: 1.0000 "application " | TOPICAL_CREAM | CUTANEOUS | Status: DC | PRN

## 2019-08-05 MED ORDER — DARBEPOETIN ALFA 60 MCG/0.3ML IJ SOSY
PREFILLED_SYRINGE | INTRAMUSCULAR | Status: AC
  Filled 2019-08-05: qty 0.3

## 2019-08-05 MED ORDER — HEPARIN SODIUM (PORCINE) 1000 UNIT/ML DIALYSIS: 20.0000 [IU]/kg | INTRAMUSCULAR | Status: DC | PRN

## 2019-08-05 MED ORDER — POTASSIUM CHLORIDE CRYS ER 20 MEQ PO TBCR
30.0000 meq | EXTENDED_RELEASE_TABLET | Freq: Once | ORAL | Status: AC
  Administered 2019-08-05: 30 meq via ORAL
  Filled 2019-08-05: qty 1

## 2019-08-05 MED ORDER — ALTEPLASE 2 MG IJ SOLR: 2.0000 mg | Freq: Once | INTRAMUSCULAR | Status: DC | PRN

## 2019-08-05 MED ORDER — PENTAFLUOROPROP-TETRAFLUOROETH EX AERO: 1.0000 "application " | INHALATION_SPRAY | CUTANEOUS | Status: DC | PRN

## 2019-08-05 NOTE — Plan of Care (Signed)
  Problem: Education: Goal: Knowledge of General Education information will improve Description: Including pain rating scale, medication(s)/side effects and non-pharmacologic comfort measures Outcome: Completed/Met   Problem: Health Behavior/Discharge Planning: Goal: Ability to manage health-related needs will improve Outcome: Completed/Met   Problem: Clinical Measurements: Goal: Ability to maintain clinical measurements within normal limits will improve Outcome: Completed/Met Goal: Will remain free from infection Outcome: Completed/Met Goal: Diagnostic test results will improve Outcome: Completed/Met Goal: Respiratory complications will improve Outcome: Completed/Met Goal: Cardiovascular complication will be avoided Outcome: Completed/Met   Problem: Elimination: Goal: Will not experience complications related to bowel motility Outcome: Completed/Met Goal: Will not experience complications related to urinary retention Outcome: Completed/Met   Problem: Skin Integrity: Goal: Risk for impaired skin integrity will decrease Outcome: Completed/Met   Problem: Education: Goal: Knowledge of disease and its progression will improve Outcome: Completed/Met   Problem: Activity: Goal: Activity intolerance will improve Outcome: Completed/Met   Problem: Respiratory: Goal: Respiratory symptoms related to disease process will be avoided Outcome: Completed/Met   Problem: Self-Concept: Goal: Body image disturbance will be avoided or minimized Outcome: Completed/Met   Problem: Urinary Elimination: Goal: Progression of disease will be identified and treated Outcome: Completed/Met

## 2019-08-05 NOTE — Progress Notes (Signed)
Patient ID: Daniel [name], male   DOB: 28-Jan-1961, 59 y.o.   MRN: [id number]   Subjective: HD: 4  Patient notes feeling well and tolerating dialysis yesterday. He notes continued pain in left arm at site of fistula. Patient eager for discharge; however, discussed need for continued hospitalization until placed with outpatient dialysis center.   Objective:  Vital signs in last 24 hours: Vitals:   08/04/19 1657 08/04/19 2125 08/05/19 0402 08/05/19 0415  BP: (!) 161/75 (!) 150/88  (!) 158/82  Pulse: 86 91  79  Resp: 18 19  19   Temp: 98.3 F (36.8 C) 98.7 F (37.1 C)  98.6 F (37 C)  TempSrc: Oral Oral  Oral  SpO2: 99% 96%  99%  Weight:   111.9 kg   Height:       Weight change: 1.8 kg  Intake/Output Summary (Last 24 hours) at 08/05/2019 0651 Last data filed at 08/05/2019 0600 Gross per 24 hour  Intake 840 ml  Output 700 ml  Net 140 ml   Physical Exam  Cardiovascular: Normal rate, regular rhythm, normal heart sounds and intact distal pulses.  Respiratory: Effort normal and breath sounds normal.  GI: Soft. Bowel sounds are normal. He exhibits no distension. There is no abdominal tenderness.  Neurological: He is alert.  Skin: Skin is warm and dry.    CBC Latest Ref Rng & Units 08/05/2019 08/04/2019 08/03/2019  WBC 4.0 - 10.5 K/uL 5.3 6.0 4.6  Hemoglobin 13.0 - 17.0 g/dL 9.1(L) 8.9(L) 9.8(L)  Hematocrit 39.0 - 52.0 % 27.5(L) 27.5(L) 28.9(L)  Platelets 150 - 400 K/uL 80(L) 97(L) 102(L)   BMP Latest Ref Rng & Units 08/05/2019 08/04/2019 08/03/2019  Glucose 70 - 99 mg/dL 139(H) 121(H) 138(H)  BUN 6 - 20 mg/dL 69(H) 88(H) 99(H)  Creatinine 0.61 - 1.24 mg/dL 7.55(H) 7.92(H) 8.22(H)  Sodium 135 - 145 mmol/L 140 139 143  Potassium 3.5 - 5.1 mmol/L 3.3(L) 3.7 3.1(L)  Chloride 98 - 111 mmol/L 104 107 110  CO2 22 - 32 mmol/L 21(L) 19(L) 20(L)  Calcium 8.9 - 10.3 mg/dL 8.5(L) 8.3(L) 8.4(L)  Phosphorus:  6.1 Albumin:  2.4 GFR:   8 Anion gap: 15   Assessment/Plan:  Principal Problem:   Symptomatic anemia Active Problems:   Hypertension   DM2 (diabetes mellitus, type 2) (HCC)   Orthostatic hypotension   CKD (chronic kidney disease), stage V (HCC)   Acute on chronic anemia   Chronic kidney disease, stage V (HCC)   AVM (arteriovenous malformation) of small bowel, acquired with hemorrhage   ESRD (end stage renal disease) ([place])  Summary:  Daniel [name] is a 59 year old male with a history of CKD V, now ESRD, HCV, and DM II admitted for symptomatic anemia and progression of CKD on HD 4.   CKD v, now ESRD:  Patient had first round of dialysis yesterday and tolerated it well. Patient currently without insurance, but he will likely be able to qualify for medicare. Currently awaiting [facility] financial department to financially clear him so that he may be accepted at outpatient HD clinic. Renal navigator and [facility] team are working with patient to resolve these barriers. We greatly appreciate their assistance.  Creatinine is stable, now 7.55 down from 7.92 yesterday. Phosphorus and calcium levels are normalizing.   - dilaudid Q4H PRN for pain at fistula site - continue sodium bircarbonate - 2nd round of HD today    Anemia, orthostatic hypotension, AVM: Anemia continues to improve. Hgb increased to 9.1 from 8.9  yesterday. Anemia was likely due to AVM in small bowel that has been repaired as well as his CKD.   - aranesp with HD - outpatient monitoring of CBC - continue protonix - ferric gluconate infusion today 125 mg   Hypokalemia:  Potassium was at 3.3 today down from 3.7.   - will be repleted with HD today    Hypertension: Improving. BP at 158/82 this morning. Will continue to monitor.   - continue amplodipine   Diet: Renal, fluid restricted 1200 mL VTE ppx: SCDs, no lovenox due to GI bleed Code:full Dispo:pending financial clearance for outpatient HD setup     LOS: 4 days   Mikael Spray, Medical Student 08/05/2019, 6:51 AM

## 2019-08-05 NOTE — Progress Notes (Signed)
Renal Navigator is understanding of patient's desire to go home, however, as previously explained to him, he will most likely be accepted for OP HD treatment, but this will be delayed due to his uninsured status. Renal Navigator checked with [facility] Admissions and patient has not yet been financially cleared. He needs to remain in the hospital until he has been financially cleared and accepted at an OP HD clinic.  Patient's referral has been diverted from [place] (no seats available at this time) to [facility], next closest clinic to have a seat available.  Navigator will follow up on Monday, 08/08/19.  Alphonzo Cruise, [place] Renal Navigator [phone]

## 2019-08-05 NOTE — Progress Notes (Addendum)
[facility] Progress Note   Subjective:  Patient doing well this morning following dialysis treatment yesterday. Notes that he hopes things work out with him getting Medicare to cover dialysis treatments. He slept well last night. He jokingly mentions that he didn't eat much dinner last night because the food is awful. He states that he is ready to get home. Notes pain surrounding L AVF site.   Objective: Vitals:   08/04/19 1657 08/04/19 2125 08/05/19 0402 08/05/19 0415  BP: (!) 161/75 (!) 150/88  (!) 158/82  Pulse: 86 91  79  Resp: 18 19  19   Temp: 98.3 F (36.8 C) 98.7 F (37.1 C)  98.6 F (37 C)  TempSrc: Oral Oral  Oral  SpO2: 99% 96%  99%  Weight:   111.9 kg   Height:       Physical Exam: General:Alert male resting in hospital bed. NAD.  Heart:RRR S1/S2 distinct. No murmurs, gallops, or rubs. Lungs:CTA BL.No increased work of breathing or use of accessory muscles. Abdomen:Mildly distended but soft, and nontender. Extremities:No edema orischemic changes. L arm incision c/d/i but with surrounding erythema and minimal swelling.  Dialysis Access: RIJ TDC, L AVF with palpable thrill.  Filed Weights   08/04/19 0652 08/04/19 0859 08/05/19 0402  Weight: 113.9 kg 113.4 kg 111.9 kg    Intake/Output Summary (Last 24 hours) at 08/05/2019 0825 Last data filed at 08/05/2019 0600 Gross per 24 hour  Intake 840 ml  Output 700 ml  Net 140 ml    Additional Objective Labs: Basic Metabolic Panel: Recent Labs  Lab 08/02/19 0713 08/02/19 0713 08/03/19 0547 08/04/19 0712 08/05/19 0533  NA 142   < > 143 139 140  K 3.2*   < > 3.1* 3.7 3.3*  CL 108   < > 110 107 104  CO2 19*   < > 20* 19* 21*  GLUCOSE 111*   < > 138* 121* 139*  BUN 111*   < > 99* 88* 69*  CREATININE 8.22*   < > 8.22* 7.92* 7.55*  CALCIUM 8.3*   < > 8.4* 8.3* 8.5*  PHOS 6.4*  --   --  6.3* 6.1*   < > = values in this interval not displayed.   Liver Function Tests: Recent Labs  Lab  08/01/19 0702 08/01/19 0702 08/02/19 0713 08/04/19 0712 08/05/19 0533  AST 111*  --   --   --   --   ALT 44  --   --   --   --   ALKPHOS 58  --   --   --   --   BILITOT 0.6  --   --   --   --   PROT 7.0  --   --   --   --   ALBUMIN 2.2*  2.2*   < > 2.2* 2.4* 2.4*   < > = values in this interval not displayed.   No results for input(s): LIPASE, AMYLASE in the last 168 hours. CBC: Recent Labs  Lab 08/01/19 0702 08/01/19 0702 08/02/19 1607 08/02/19 3710 08/03/19 0547 08/04/19 0713 08/05/19 0533  WBC 4.3   < > 4.2   < > 4.6 6.0 5.3  HGB 8.9*   < > 9.0*   < > 9.8* 8.9* 9.1*  HCT 26.0*   < > 27.5*   < > 28.9* 27.5* 27.5*  MCV 97.7  --  99.3  --  98.6 100.7* 99.6  PLT 110*   < > 98*   < >  102* 97* 80*   < > = values in this interval not displayed.   Blood Culture    Component Value Date/Time   SDES ABDOMEN ABSCESS 06/29/2016 1300   SPECREQUEST NONE 06/29/2016 1300   CULT  06/29/2016 1300    MODERATE STAPHYLOCOCCUS LUGDUNENSIS ABUNDANT PEPTOSTREPTOCOCCUS SPECIES ABUNDANT ANAEROBIC GRAM NEGATIVE ROD UNABLE TO FURTHER IDENTIFY. BETA LACTAMASE NEGATIVE Performed at [facility], [place] [address] [place], [place] [address]    REPTSTATUS 07/06/2016 FINAL 06/29/2016 1300    Cardiac Enzymes: No results for input(s): CKTOTAL, CKMB, CKMBINDEX, TROPONINI in the last 168 hours. CBG: Recent Labs  Lab 07/31/19 1703 08/03/19 1205 08/03/19 1413  GLUCAP 178* 121* 171*   Iron Studies: No results for input(s): IRON, TIBC, TRANSFERRIN, FERRITIN in the last 72 hours. Lab Results  Component Value Date   INR 1.0 08/03/2019   INR 1.0 07/23/2019   INR 1.0 06/27/2019   Studies/Results: DG Chest Port 1 View  Result Date: 08/03/2019 CLINICAL DATA:  Status post dialysis catheter insertion. EXAM: PORTABLE CHEST 1 VIEW COMPARISON:  Radiograph 07/23/2019 FINDINGS: Right internal jugular dialysis catheter tip in the lower SVC. No pneumothorax. Unchanged heart size and mediastinal  contours, mild cardiomegaly. Aortic atherosclerosis. Vascular congestion with suggestion of mild pulmonary edema. No confluent airspace disease or large pleural effusion. IMPRESSION: 1. Tip of the right internal jugular dialysis catheter in the mid-lower SVC. No pneumothorax. 2. Stable cardiomegaly. Vascular congestion with suggestion of mild pulmonary edema. Aortic Atherosclerosis (ICD10-I70.0). Electronically Signed   By: Keith Rake M.D.   On: 08/03/2019 16:42   DG Fluoro Guide CV Line-No Report  Result Date: 08/03/2019 Fluoroscopy was utilized by the requesting physician.  No radiographic interpretation.    Medications: . sodium chloride    . sodium chloride 10 mL/hr at 08/03/19 1201  . lactated ringers Stopped (08/02/19 1223)   . amLODipine  10 mg Oral Daily  . Chlorhexidine Gluconate Cloth  6 each Topical Q0600  . darbepoetin (ARANESP) injection - DIALYSIS  60 mcg Intravenous To Hemo  . [START ON 08/12/2019] darbepoetin (ARANESP) injection - DIALYSIS  60 mcg Intravenous Q Fri-HD  . pantoprazole  40 mg Oral Daily  . sodium chloride flush  3 mL Intravenous Once    Dialysis Orders:    Assessment/Plan: 59 y.o M with PMH ofCKD V, DMT2, ACD, Hep C, and HTN. Admitted 07/31/19 with symptomatic anemia s/p dizziness and ground-level fall with BUN:Cr of 104:8.06. Patient previously evaluated by Dr. Augustin Coupe for significant progression of CKD with intended follow up with [facility]. RIJ TDC and L brachial artery/cephalic vein fistula placed 08/04/19. First HD treatment 08/04/19.   1.ProgressiveCKD V to new ESRD with metabolic acidosis - Recent workup forrenal failure was unremarkable.CT abd 07/17/19 showed no evidence of obstruction.Renal US 07/01/19 with medical renal disease. UA +for proteinuria on admission. Protein:Cr pending.EPO 14.3. - Cr down from 7.92 to 7.55 today. - CO2 21. Continue sodium bicarb 1.3 g PO BID -Vasc Surg placed TDC and LUE 08/03/19. Patient completed 2 hours of HD yesterday,  08/04/19, with no problems. Plan for second round of HD today, 08/05/19.  - Renal navigator met with patient yesterday. Referral for OP HD submitted to [facility] for treatment at [facility] 08/05/19.   2.Anemiadue to GI bleed andCKD - GI blood loss with feraheme administered int he past. Recent transfusions in Dec 2020 and twice in Mar 2020. GI workup with EGD, colonoscopy, and capsule study Mar 2020 with no confirmed etiology. Hgb 7.1 on admission,  9.1 today, 08/05/19.T sat 22%. -Upper EGD repeated yesterday, 08/02/19,revealed non-bleeding ulcer.  - Iron and ESA with HD treatments.   3. Hypokalemia - Down from 3.7 to 3.3 - Replete with HD. Repeat renal function panel in the morning.   4.Secondary hyperparathyroidism - PTH 217,Phos 6. Expect to improve withinitiation of dialysis.  5.HTN/volume - Expect BP to improve with HD. Continue to monitor. May consider restarting home clonidine.  6.History of untreated Hep C.3.7 million 12/2018  7.Diabetes. Per primary team.   Nephrology will continue to follow along in the care of this patient.  Marisa Sprinkles, PA-S2 [facility]  Nephrology attending: Patient was seen and examined at bedside. Chart reviewed. I agree with assessment plan except as outlined above.  59 year old male with history of DM, hep C, HTN, CKD V, admitted with anemia and found to have worsening renal failure.  UA with proteinuria without RBC. Kidney ultrasound with medical renal disease. On examination: Lying on bed comfortable, not in distress. Feels weak. Lungs clear bilateral. Cardiovascular regular rate rhythm. No rubs. No lower extremity edema.  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 Patient recently had extensive work-up for renal failure including unremarkable serology.   Status post right IJ TDC and creation of left brachial artery to cephalic vein AV fistula by  Dr. Donzetta Matters on 4/7.  Initiated dialysis on 4/8, tolerating well.  Second HD today. Social worker consulted to arrange for OP HD unit.  #Anemia due to GI bleed and CKD: Upper EGD on 4/6 with nonbleeding ulcer. Iron saturation 22%. Started iron and ESA during HD.  #Secondary hyperparathyroidism.: PTH 217 which is at goal.  Phosphorus level expect to improve with dialysis.  #Hypokalemia: higher K bath with HD, no need to replete oral KCL.  #Hypertension/volume: BP improving with HD. Monitor.   # Disp: Okay to discharge when OP HD unit arranged.  Discussed with renal navigator.  Daniel James, MD [facility].

## 2019-08-06 DIAGNOSIS — D631 Anemia in chronic kidney disease: Secondary | ICD-10-CM

## 2019-08-06 LAB — GLUCOSE, CAPILLARY: Glucose-Capillary: 107 mg/dL — ABNORMAL HIGH (ref 70–99)

## 2019-08-06 MED ORDER — HEPARIN SODIUM (PORCINE) 1000 UNIT/ML IJ SOLN
INTRAMUSCULAR | Status: AC
  Administered 2019-08-06: 3000 [IU]
  Filled 2019-08-06: qty 3

## 2019-08-06 MED ORDER — CHLORHEXIDINE GLUCONATE CLOTH 2 % EX PADS
6.0000 | MEDICATED_PAD | Freq: Every day | CUTANEOUS | Status: DC
  Administered 2019-08-06 – 2019-08-08 (×3): 6 via TOPICAL

## 2019-08-06 MED ORDER — CYCLOBENZAPRINE HCL 5 MG PO TABS
5.0000 mg | ORAL_TABLET | Freq: Three times a day (TID) | ORAL | Status: DC | PRN
  Administered 2019-08-06 – 2019-08-07 (×2): 5 mg via ORAL
  Filled 2019-08-06 (×2): qty 1

## 2019-08-06 NOTE — Progress Notes (Signed)
   Subjective: Patient was seen and evaluated at bedside on morning rounds. No acute events overnight. He complaines of some muscle cramp at distal left upper extremity where procedure performed.  He denies any numbness, tingling.  He was informed that he has as needed pain medication available.  We discussed that we are waiting for outpatient hemodialysis arrangement for him.  Objective:  Vital signs in last 24 hours: Vitals:   08/05/19 1348 08/05/19 1648 08/05/19 2052 08/06/19 0506  BP: (!) 152/102 (!) 166/78 (!) 154/81 (!) 156/96  Pulse: 94 84 82 88  Resp: 16 18 19 16   Temp: 98.2 F (36.8 C) 98.2 F (36.8 C) 98.9 F (37.2 C) 98.7 F (37.1 C)  TempSrc: Oral Oral Oral Oral  SpO2: 98% 98% 97% 97%  Weight: 111.6 kg     Height:       Constitutional:Well-developed and well-nourished. No acute distress.  HENT:  Head: Normocephalic and atraumatic.  Eyes: Conjunctivae are normal, EOM nl Cardiovascular: RRR, nl E0F1, systolic murmur, no LEE Respiratory: Effort normal and breath sounds normal. No respiratory distress. No wheezes.  GI: Soft. Bowel sounds are normal. No distension. There is no tenderness.  Neurological: Is alert and oriented x 3  MSK: Surgical incision site of left upper extremity is clean, without erythema, swelling or discharge.  Radial pulses are normal, has normal capillary refill bilaterally. Psychiatric:  Normal mood and affect. Behavior is normal. Judgment and thought content normal.    Assessment/Plan:  Principal Problem:   Symptomatic anemia Active Problems:   Hypertension   DM2 (diabetes mellitus, type 2) (HCC)   Orthostatic hypotension   CKD (chronic kidney disease), stage V (HCC)   Acute on chronic anemia   Chronic kidney disease, stage V (HCC)   AVM (arteriovenous malformation) of small bowel, acquired with hemorrhage   ESRD (end stage renal disease) (HCC)  Daniel [name] is a 59 year old male with a history of CKD V, now ESRD, HCV, and DM II  admitted for symptomatic anemia and progression of CKD to ESRD on HD 4.   CKDV, now ESRD:  -Started on hemodialysis this admission -Nephrology is on board appreciate recommendations -Medically stable, awaiting CLIP  Anemia, orthostatic hypotension: In setting of blood loss through small bowel AVM, also anemia of chronic disease and anemia secondary to ESRD.  -Status post 2 units transfusion on arrival, also getting Aranesp and iron through dialysis -GI was consulted and evaluated patient. -Plan to do outpatient follow-up with GI.  Appointment scheduled for 4/18 -Hemoglobin stable at 9.1 today -Monitoring CBC  Prior to Admission Living Arrangement: Home Anticipated Discharge Location: Home Barriers to Discharge: Awaiting outpatient hemodialysis arrangement Dispo: Anticipate discharge next week, pending CLIP  Dewayne Hatch, MD 08/06/2019, 6:40 AM Pager: @[email or web address] @

## 2019-08-06 NOTE — Progress Notes (Signed)
Patient continues to refuse telemetry.

## 2019-08-06 NOTE — Progress Notes (Signed)
Patient is refusing tele monitor MD notify.

## 2019-08-06 NOTE — Progress Notes (Signed)
[facility] NEPHROLOGY PROGRESS NOTE  Assessment/ Plan: Pt is a 59 y.o. yo male  with history of DM, hep C, HTN, CKD V, admitted with anemia and found to have worsening renal failure.  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 Patient recently had extensive work-up for renal failure including unremarkable serology.  Status post right IJ TDC and creation of left brachial artery to cephalic vein AV fistula by Dr. Donzetta Matters on 4/7. Initiated dialysis on 4/8, tolerating well.  Plan for third HD today.  Clinically improved. Renal navigator is following for OP HD unit, delaying because of uninsured status.  #Anemia due to GI bleed and CKD: Upper EGD on 4/6 with nonbleeding ulcer. Iron saturation 22%. Started iron and ESA during HD.  #Secondary hyperparathyroidism.: PTH 217 which is at goal.    Start PhosLo.  #Hypokalemia: higher K bath with HD, no need to replete oral KCL.  #Hypertension/volume: BP improving with HD. Monitor.   # Disp: Okay to discharge when OP HD unit arranged.  Discussed with renal navigator.  Subjective: Seen and examined bedside.  He is walking in the room.  Denies nausea vomiting chest pain shortness of breath.  Tolerated HD well yesterday with 1 L UF.  No new event. Objective Vital signs in last 24 hours: Vitals:   08/05/19 1648 08/05/19 2052 08/06/19 0506 08/06/19 1015  BP: (!) 166/78 (!) 154/81 (!) 156/96 (!) 147/88  Pulse: 84 82 88 92  Resp: 18 19 16 18   Temp: 98.2 F (36.8 C) 98.9 F (37.2 C) 98.7 F (37.1 C) 98.5 F (36.9 C)  TempSrc: Oral Oral Oral Oral  SpO2: 98% 97% 97% 100%  Weight:      Height:       Weight change: -0.6 kg  Intake/Output Summary (Last 24 hours) at 08/06/2019 1116 Last data filed at 08/06/2019 0900 Gross per 24 hour  Intake 480 ml  Output 1250 ml  Net -770 ml       Labs: Basic Metabolic Panel: Recent Labs  Lab 08/02/19 0713 08/02/19 0713  08/03/19 0547 08/04/19 0712 08/05/19 0533  NA 142   < > 143 139 140  K 3.2*   < > 3.1* 3.7 3.3*  CL 108   < > 110 107 104  CO2 19*   < > 20* 19* 21*  GLUCOSE 111*   < > 138* 121* 139*  BUN 111*   < > 99* 88* 69*  CREATININE 8.22*   < > 8.22* 7.92* 7.55*  CALCIUM 8.3*   < > 8.4* 8.3* 8.5*  PHOS 6.4*  --   --  6.3* 6.1*   < > = values in this interval not displayed.   Liver Function Tests: Recent Labs  Lab 08/01/19 0702 08/01/19 0702 08/02/19 0713 08/04/19 0712 08/05/19 0533  AST 111*  --   --   --   --   ALT 44  --   --   --   --   ALKPHOS 58  --   --   --   --   BILITOT 0.6  --   --   --   --   PROT 7.0  --   --   --   --   ALBUMIN 2.2*  2.2*   < > 2.2* 2.4* 2.4*   < > = values in this interval not displayed.   No results for input(s): LIPASE, AMYLASE in the last 168 hours. No results for  input(s): AMMONIA in the last 168 hours. CBC: Recent Labs  Lab 08/01/19 0702 08/01/19 0702 08/02/19 0258 08/02/19 5277 08/03/19 0547 08/04/19 0713 08/05/19 0533  WBC 4.3   < > 4.2   < > 4.6 6.0 5.3  HGB 8.9*   < > 9.0*   < > 9.8* 8.9* 9.1*  HCT 26.0*   < > 27.5*   < > 28.9* 27.5* 27.5*  MCV 97.7  --  99.3  --  98.6 100.7* 99.6  PLT 110*   < > 98*   < > 102* 97* 80*   < > = values in this interval not displayed.   Cardiac Enzymes: No results for input(s): CKTOTAL, CKMB, CKMBINDEX, TROPONINI in the last 168 hours. CBG: Recent Labs  Lab 07/31/19 1703 08/03/19 1205 08/03/19 1413  GLUCAP 178* 121* 171*    Iron Studies: No results for input(s): IRON, TIBC, TRANSFERRIN, FERRITIN in the last 72 hours. Studies/Results: No results found.  Medications: Infusions: . sodium chloride    . sodium chloride 10 mL/hr at 08/03/19 1201  . ferric gluconate (FERRLECIT/NULECIT) IV 125 mg (08/05/19 1438)  . lactated ringers Stopped (08/02/19 1223)    Scheduled Medications: . amLODipine  10 mg Oral Daily  . Chlorhexidine Gluconate Cloth  6 each Topical Q0600  . Chlorhexidine  Gluconate Cloth  6 each Topical Q0600  . darbepoetin (ARANESP) injection - DIALYSIS  60 mcg Intravenous Q Fri-HD  . pantoprazole  40 mg Oral Daily  . sodium chloride flush  3 mL Intravenous Once    have reviewed scheduled and prn medications.  Physical Exam: General:NAD, comfortable Heart:RRR, s1s2 nl Lungs:clear b/l, no crackle Abdomen:soft, Non-tender, non-distended Extremities:No edema Dialysis Access: Right IJ TDC and left UE AV fistula, site healing well with some swelling of the arm.  Jaielle Dlouhy Prasad Tiawanna Luchsinger 08/06/2019,11:16 AM  LOS: 5 days  Pager: [phone]

## 2019-08-06 NOTE — Progress Notes (Signed)
Continue to refuse telemetry.

## 2019-08-06 NOTE — Plan of Care (Signed)
  Problem: Pain Managment: Goal: General experience of comfort will improve Outcome: Completed/Met   Problem: Clinical Measurements: Goal: Complications related to the disease process or treatment will be avoided or minimized Outcome: Completed/Met Goal: Dialysis access will remain free of complications Outcome: Completed/Met   Problem: Fluid Volume: Goal: Fluid volume balance will be maintained or improved Outcome: Completed/Met

## 2019-08-07 LAB — GLUCOSE, CAPILLARY
Glucose-Capillary: 91 mg/dL (ref 70–99)
Glucose-Capillary: 133 mg/dL — ABNORMAL HIGH (ref 70–99)

## 2019-08-07 NOTE — Progress Notes (Signed)
[facility] NEPHROLOGY PROGRESS NOTE  Assessment/ Plan: Pt is a 59 y.o. yo male  with history of DM, hep C, HTN, CKD V, admitted with anemia and found to have worsening renal failure.  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 Patient recently had extensive work-up for renal failure including unremarkable serology.  Status post right IJ TDC and creation of left brachial artery to cephalic vein AV fistula by Dr. Donzetta Matters on 4/7. Initiated dialysis on 4/8, tolerating well.  3rd HD LT treatment on 4/10, tolerated well.  Clinically improved.  Plan for next HD on 4/13 per TTS schedule. Renal navigator is following for OP HD unit, delaying because of uninsured status.  #Anemia due to GI bleed and CKD: Upper EGD on 4/6 with nonbleeding ulcer. Iron saturation 22%. Started iron and ESA during HD.  #Secondary hyperparathyroidism.: PTH 217 which is at goal.    Started PhosLo.  #Hypokalemia: higher K bath with HD, no need to replete oral KCL.  #Hypertension/volume: BP improving with HD. Monitor.   # Disp: Okay to discharge when OP HD unit arranged.  Discussed with renal navigator.  Subjective: Seen and examined bedside.  Has been tolerating dialysis well.  Denies nausea vomiting chest pain shortness of breath walking in the room. Objective Vital signs in last 24 hours: Vitals:   08/06/19 1745 08/06/19 2157 08/07/19 0517 08/07/19 0922  BP: (!) 146/92 121/60 (!) 145/68 140/71  Pulse: (!) 103 96 93 95  Resp: 18 20 20 18   Temp: 98.9 F (37.2 C) 99.4 F (37.4 C) 98.3 F (36.8 C) 98.6 F (37 C)  TempSrc: Oral Oral Oral Oral  SpO2: 98% 100% 95% 96%  Weight: 109.8 kg     Height:       Weight change: -2 kg  Intake/Output Summary (Last 24 hours) at 08/07/2019 0955 Last data filed at 08/07/2019 0900 Gross per 24 hour  Intake 480 ml  Output 2425 ml  Net -1945 ml       Labs: Basic Metabolic Panel: Recent Labs  Lab  08/02/19 0713 08/02/19 0713 08/03/19 0547 08/04/19 0712 08/05/19 0533  NA 142   < > 143 139 140  K 3.2*   < > 3.1* 3.7 3.3*  CL 108   < > 110 107 104  CO2 19*   < > 20* 19* 21*  GLUCOSE 111*   < > 138* 121* 139*  BUN 111*   < > 99* 88* 69*  CREATININE 8.22*   < > 8.22* 7.92* 7.55*  CALCIUM 8.3*   < > 8.4* 8.3* 8.5*  PHOS 6.4*  --   --  6.3* 6.1*   < > = values in this interval not displayed.   Liver Function Tests: Recent Labs  Lab 08/01/19 0702 08/01/19 0702 08/02/19 0713 08/04/19 0712 08/05/19 0533  AST 111*  --   --   --   --   ALT 44  --   --   --   --   ALKPHOS 58  --   --   --   --   BILITOT 0.6  --   --   --   --   PROT 7.0  --   --   --   --   ALBUMIN 2.2*  2.2*   < > 2.2* 2.4* 2.4*   < > = values in this interval not displayed.   No results for input(s): LIPASE, AMYLASE in the last 168 hours.  No results for input(s): AMMONIA in the last 168 hours. CBC: Recent Labs  Lab 08/01/19 0702 08/01/19 0702 08/02/19 7829 08/02/19 5621 08/03/19 0547 08/04/19 0713 08/05/19 0533  WBC 4.3   < > 4.2   < > 4.6 6.0 5.3  HGB 8.9*   < > 9.0*   < > 9.8* 8.9* 9.1*  HCT 26.0*   < > 27.5*   < > 28.9* 27.5* 27.5*  MCV 97.7  --  99.3  --  98.6 100.7* 99.6  PLT 110*   < > 98*   < > 102* 97* 80*   < > = values in this interval not displayed.   Cardiac Enzymes: No results for input(s): CKTOTAL, CKMB, CKMBINDEX, TROPONINI in the last 168 hours. CBG: Recent Labs  Lab 07/31/19 1703 08/03/19 1205 08/03/19 1413 08/06/19 2223 08/07/19 0642  GLUCAP 178* 121* 171* 107* 91    Iron Studies: No results for input(s): IRON, TIBC, TRANSFERRIN, FERRITIN in the last 72 hours. Studies/Results: No results found.  Medications: Infusions: . sodium chloride    . sodium chloride 10 mL/hr at 08/03/19 1201  . ferric gluconate (FERRLECIT/NULECIT) IV 125 mg (08/05/19 1438)  . lactated ringers Stopped (08/02/19 1223)    Scheduled Medications: . amLODipine  10 mg Oral Daily  .  Chlorhexidine Gluconate Cloth  6 each Topical Q0600  . Chlorhexidine Gluconate Cloth  6 each Topical Q0600  . darbepoetin (ARANESP) injection - DIALYSIS  60 mcg Intravenous Q Fri-HD  . pantoprazole  40 mg Oral Daily  . sodium chloride flush  3 mL Intravenous Once    have reviewed scheduled and prn medications.  Physical Exam: General:NAD, comfortable, walking in the room and hallway. Heart:RRR, s1s2 nl Lungs:clear b/l, no crackle Abdomen:soft, Non-tender, non-distended Extremities:No edema Dialysis Access: Right IJ TDC and left UE AV fistula, site healing well with some swelling of the arm.  Daniel [name] Daniel [name] 08/07/2019,9:55 AM  LOS: 6 days  Pager: [phone]

## 2019-08-07 NOTE — Progress Notes (Signed)
Patient ID: Daniel [name], male   DOB: 05-10-1960, 59 y.o.   MRN: [id number]   Subjective:  HD: 6  Daniel [name] was seen this morning on rounds and reports feeling well, but is ready to leave the hospital. We discussed that the renal coordinator is still working on the process of financial approval and insurance set up for outpatient dialysis. He expressed understanding of this and had no other questions or concerns. He reports no further pain in his av fistula site and says the muscle spasms in his hand have improved since yesterday.   Objective:  Vital signs in last 24 hours: Vitals:   08/06/19 1730 08/06/19 1745 08/06/19 2157 08/07/19 0517  BP: (!) 146/98 (!) 146/92 121/60 (!) 145/68  Pulse: 97 (!) 103 96 93  Resp:  18 20 20   Temp:  98.9 F (37.2 C) 99.4 F (37.4 C) 98.3 F (36.8 C)  TempSrc:  Oral Oral Oral  SpO2:  98% 100% 95%  Weight:  109.8 kg    Height:       Weight change: -2 kg  Intake/Output Summary (Last 24 hours) at 08/07/2019 0655 Last data filed at 08/07/2019 0600 Gross per 24 hour  Intake 480 ml  Output 1425 ml  Net -945 ml   Physical Exam  Constitutional: He is oriented to person, place, and time.  Cardiovascular: Normal rate, regular rhythm, normal heart sounds, intact distal pulses and normal pulses.  No edema present.   Respiratory: Effort normal and breath sounds normal.  GI: Soft. Bowel sounds are normal. He exhibits no distension. There is no abdominal tenderness.  Neurological: He is alert and oriented to person, place, and time.  Skin: Skin is warm and dry.   Assessment/Plan:  Principal Problem:   Symptomatic anemia Active Problems:   Hypertension   DM2 (diabetes mellitus, type 2) (HCC)   Orthostatic hypotension   CKD (chronic kidney disease), stage V (HCC)   Acute on chronic anemia   Chronic kidney disease, stage V (HCC)   AVM (arteriovenous malformation) of small bowel, acquired with hemorrhage   ESRD (end stage renal disease)  ([place])  Summary: Daniel [name] is a 59 year old male with a history of CKD, V now ESRD, DM II, and HCV admitted for symptomatic acute on chronic anemia and progression CKD to ESRD on HD 5.    CKD V. Now ESRD on hemodialysis:  Nephology following; we appreciate their recommendations.  - now on hemodialysis - medically stable, awaiting CLIP - start PhosLo - Dilaudid 1 mg Q4H PRN   Anemia, orthostatic hypotension:  Anemia due to CKD and GI bleed. Hgb has been stable since ablation of small bowel AVM.  - Aranesp and iron during dialysis - continue protonix  - Follow up with outpatient GI appointment on 04/18   Diet: Renal diet with fluid restriction 1200 mL  Prior to Admission Living Arrangement: Home Anticipated Discharge Location: Home Barriers to Discharge: Awaiting outpatient hemodialysis arrangement Dispo: Anticipate discharge next week, pending CLIP    LOS: 6 days   Mikael Spray, Medical Student 08/07/2019, 6:55 AM

## 2019-08-07 NOTE — Plan of Care (Signed)
  Problem: Safety: Goal: Ability to remain free from injury will improve Outcome: Progressing   

## 2019-08-07 NOTE — Progress Notes (Signed)
Patient is still refusing tele monitoring.

## 2019-08-08 LAB — GLUCOSE, CAPILLARY
Glucose-Capillary: 159 mg/dL — ABNORMAL HIGH (ref 70–99)
Glucose-Capillary: 111 mg/dL — ABNORMAL HIGH (ref 70–99)

## 2019-08-08 MED ORDER — SODIUM CHLORIDE 0.9 % IV SOLN
125.0000 mg | INTRAVENOUS | Status: DC
  Administered 2019-08-09: 125 mg via INTRAVENOUS
  Filled 2019-08-08: qty 10

## 2019-08-08 MED ORDER — DARBEPOETIN ALFA 60 MCG/0.3ML IJ SOSY: 60.0000 ug | PREFILLED_SYRINGE | INTRAMUSCULAR | Status: DC

## 2019-08-08 MED ORDER — RENA-VITE PO TABS
1.0000 | ORAL_TABLET | Freq: Every day | ORAL | Status: DC
  Administered 2019-08-08: 1 via ORAL
  Filled 2019-08-08: qty 1

## 2019-08-08 NOTE — Progress Notes (Signed)
Patient ID: Daniel [name], male   DOB: May 14, 1960, 59 y.o.   MRN: [id number]  [facility] Progress Note   Assessment/ Plan:   1. ESRD: Following rather rapidly progressive chronic kidney disease with progression to uremic symptoms and indications for dialysis.  He is currently on a TTS dialysis schedule as we await outpatient dialysis unit placement (unable to do so at this time due to lack of financial clearance).  He is status post left BCF by Dr. Donzetta Matters on 4/7 and is currently undergoing HD via right IJ TDC. 2. Anemia: He is currently on intravenous iron and ESA, no overt blood loss.  We will continue to monitor H/H trend. 3. CKD-MBD: Elevated phosphorus level noted on labs from 3 days ago, will recheck labs again tomorrow with hemodialysis to determine need for binders. 4. Nutrition: Continue renal diet with protein supplementation.  Begin renal multivitamin. 5. Hypertension: Blood pressure intermittently elevated, will continue to monitor with consistent hemodialysis for additional adjustment of antihypertensive therapy.  Subjective:   Reports to be feeling fair, frustrated with length of stay in the hospital.  Would like to go downstairs and check on his car.   Objective:   BP (!) 150/89 (BP Location: Right Arm)   Pulse 94   Temp 98.7 F (37.1 C) (Oral)   Resp 17   Ht 5\' 11"  (1.803 m)   Wt 110.1 kg   SpO2 98%   BMI 33.85 kg/m   Physical Exam: Gen: Appears comfortable resting in bed, laying on his left side. CVS: Pulse regular rhythm, normal rate, S1 and S2 normal Resp: Clear to auscultation, no rales/rhonchi Abd: Soft, obese, nontender Ext: Trace lower extremity edema.  Palpable thrill over left BCF with associated erythema/ecchymosis.  Labs: BMET Recent Labs  Lab 08/02/19 0713 08/03/19 0547 08/04/19 0712 08/05/19 0533  NA 142 143 139 140  K 3.2* 3.1* 3.7 3.3*  CL 108 110 107 104  CO2 19* 20* 19* 21*  GLUCOSE 111* 138* 121* 139*  BUN 111* 99* 88*  69*  CREATININE 8.22* 8.22* 7.92* 7.55*  CALCIUM 8.3* 8.4* 8.3* 8.5*  PHOS 6.4*  --  6.3* 6.1*   CBC Recent Labs  Lab 08/02/19 0638 08/03/19 0547 08/04/19 0713 08/05/19 0533  WBC 4.2 4.6 6.0 5.3  HGB 9.0* 9.8* 8.9* 9.1*  HCT 27.5* 28.9* 27.5* 27.5*  MCV 99.3 98.6 100.7* 99.6  PLT 98* 102* 97* 80*     Medications:    . amLODipine  10 mg Oral Daily  . Chlorhexidine Gluconate Cloth  6 each Topical Q0600  . Chlorhexidine Gluconate Cloth  6 each Topical Q0600  . darbepoetin (ARANESP) injection - DIALYSIS  60 mcg Intravenous Q Fri-HD  . pantoprazole  40 mg Oral Daily  . sodium chloride flush  3 mL Intravenous Once   Elmarie Shiley, MD 08/08/2019, 11:37 AM

## 2019-08-08 NOTE — Progress Notes (Signed)
Patient has not received financial clearance for OP HD approval-Renal Navigator continuing to follow closely and will check again this afternoon.  Alphonzo Cruise, [place] Renal Navigator [phone]

## 2019-08-08 NOTE — Progress Notes (Signed)
Patient ID: Daniel [name], male   DOB: 1961/04/24, 59 y.o.   MRN: [id number]   Subjective: HD: 7   Daniel [name] was seen this morning on rounds and has no complaints. He is ready to go home, but understands that he must wait for outpatient hemodialysis is set up. He is frustrated at this but is okay with continued hospitalization.   Objective:  Vital signs in last 24 hours: Vitals:   08/07/19 0922 08/07/19 1637 08/07/19 2044 08/08/19 0617  BP: 140/71 (!) 147/81 (!) 159/90 (!) 145/97  Pulse: 95 95 91 91  Resp: 18 18 18 16   Temp: 98.6 F (37 C) 99.2 F (37.3 C) 98.4 F (36.9 C) 98.4 F (36.9 C)  TempSrc: Oral Oral Oral Oral  SpO2: 96% 96% 99% 99%  Weight:   110.1 kg   Height:       Weight change: -0.7 kg  Intake/Output Summary (Last 24 hours) at 08/08/2019 0700 Last data filed at 08/08/2019 0200 Gross per 24 hour  Intake 960 ml  Output 1950 ml  Net -990 ml   Physical Exam  Cardiovascular: Normal rate, regular rhythm, normal heart sounds and intact distal pulses.  Respiratory: Effort normal and breath sounds normal.  GI: Soft. He exhibits no distension. There is no abdominal tenderness.  Neurological: He is alert.  Skin: Skin is warm and dry.   Assessment/Plan:  Principal Problem:   Symptomatic anemia Active Problems:   Hypertension   DM2 (diabetes mellitus, type 2) (HCC)   Orthostatic hypotension   CKD (chronic kidney disease), stage V (HCC)   Acute on chronic anemia   Chronic kidney disease, stage V (HCC)   AVM (arteriovenous malformation) of small bowel, acquired with hemorrhage   ESRD (end stage renal disease) ([place])  Summary: Daniel [name] is a 59 year old male with a history of CKD V, now ESRD, DM II, and HCV who was admitted for CKD progression and symptomatic anemia on HD 7.   CKD V, now ESRD:  Patient tolerating dialysis well. Nex HD session scheduled for 04/13. Awaiting CLIP process. Spoke with renal coordinator this morning and there are no  new updates. She will contact me when he has a chair and financial approval. Nephrology follow; we appreciate all recommendations.  - patient started on HD 04/08 with new AV fistula and tunnel catheter placement on 04/07 - medically stable - waiting on financial approval; already has seat for OP HD - Dilaudid 1 mg Q4H PRN   Anemia, orthostatic hypotension:  Due to CKD and GI bleed. GI bleeding resolved after ablation of small bowel AVM.  -Aranesp and iron during dialysis - continue protonix - follow up outpatient GI on  04/18   Diet: Renal diet with fluid restriction 1200 mL  Prior to Admission Living Arrangement:Home Anticipated Discharge Location:Home Barriers to [place] outpatient hemodialysis arrangement Dispo:Anticipate discharge next few days, pendingCLIP    LOS: 7 days   Mikael Spray, Medical Student 08/08/2019, 7:00 AM

## 2019-08-09 LAB — RENAL FUNCTION PANEL
Albumin: 2.6 g/dL — ABNORMAL LOW (ref 3.5–5.0)
Anion gap: 15 (ref 5–15)
BUN: 45 mg/dL — ABNORMAL HIGH (ref 6–20)
CO2: 23 mmol/L (ref 22–32)
Calcium: 8.7 mg/dL — ABNORMAL LOW (ref 8.9–10.3)
Chloride: 98 mmol/L (ref 98–111)
Creatinine, Ser: 8 mg/dL — ABNORMAL HIGH (ref 0.61–1.24)
GFR calc Af Amer: 8 mL/min — ABNORMAL LOW (ref >60)
GFR calc non Af Amer: 7 mL/min — ABNORMAL LOW (ref >60)
Glucose, Bld: 122 mg/dL — ABNORMAL HIGH (ref 70–99)
Phosphorus: 6.5 mg/dL — ABNORMAL HIGH (ref 2.5–4.6)
Potassium: 3.1 mmol/L — ABNORMAL LOW (ref 3.5–5.1)
Sodium: 136 mmol/L (ref 135–145)

## 2019-08-09 LAB — CBC
HCT: 29.3 % — ABNORMAL LOW (ref 39.0–52.0)
Hemoglobin: 9.5 g/dL — ABNORMAL LOW (ref 13.0–17.0)
MCH: 33.1 pg (ref 26.0–34.0)
MCHC: 32.4 g/dL (ref 30.0–36.0)
MCV: 102.1 fL — ABNORMAL HIGH (ref 80.0–100.0)
Platelets: 115 10*3/uL — ABNORMAL LOW (ref 150–400)
RBC: 2.87 MIL/uL — ABNORMAL LOW (ref 4.22–5.81)
RDW: 19.2 % — ABNORMAL HIGH (ref 11.5–15.5)
WBC: 5.7 10*3/uL (ref 4.0–10.5)
nRBC: 0.7 % — ABNORMAL HIGH (ref 0.0–0.2)

## 2019-08-09 MED ORDER — HYDROMORPHONE HCL 1 MG/ML IJ SOLN: 1.0000 mg | Freq: Once | INTRAMUSCULAR | Status: AC

## 2019-08-09 MED ORDER — HEPARIN SODIUM (PORCINE) 1000 UNIT/ML DIALYSIS
40.0000 [IU]/kg | INTRAMUSCULAR | Status: DC | PRN
  Filled 2019-08-09: qty 5

## 2019-08-09 MED ORDER — POTASSIUM CHLORIDE CRYS ER 20 MEQ PO TBCR: 40.0000 meq | EXTENDED_RELEASE_TABLET | Freq: Once | ORAL | Status: DC

## 2019-08-09 MED ORDER — HYDROMORPHONE HCL 2 MG PO TABS: 1.0000 mg | ORAL_TABLET | Freq: Four times a day (QID) | ORAL | 0 refills | Status: DC | PRN

## 2019-08-09 MED ORDER — HEPARIN SODIUM (PORCINE) 1000 UNIT/ML IJ SOLN
INTRAMUSCULAR | Status: AC
  Administered 2019-08-09: 3000 [IU] via INTRAVENOUS
  Filled 2019-08-09: qty 3

## 2019-08-09 MED ORDER — HYDROMORPHONE HCL 1 MG/ML IJ SOLN
INTRAMUSCULAR | Status: AC
  Administered 2019-08-09: 1 mg via INTRAVENOUS
  Filled 2019-08-09: qty 1

## 2019-08-09 MED ORDER — ACETAMINOPHEN 325 MG PO TABS: 650.0000 mg | ORAL_TABLET | ORAL | Status: DC | PRN

## 2019-08-09 MED ORDER — HEPARIN SODIUM (PORCINE) 1000 UNIT/ML IJ SOLN: 3000.0000 [IU] | INTRAMUSCULAR | Status: DC

## 2019-08-09 NOTE — Progress Notes (Signed)
Renal Navigator spoke with Director of Operations/A. Jacqualin Combes regarding the fact that patient has still not been cleared financially by [employer] and really needs to discharge from the hospital by this point. Ms. Jacqualin Combes states she will take patient at his word that he has worked all his life, since given his dx of ESRD, he will qualify for Medicare with work hx, as long as he understands that he will receive a bill for OP HD if he is not giving accurate information. This was discussed with patient and he again confirms that he has worked all his life. He is very appreciative of the exception being made in order for him to be released from the hospital to start in the OP HD clinic. Patient has been instructed verbally and in writing that he needs to report to the clinic on Thursday, 08/11/19 at 11:00am to complete intake paperwork prior to his first treatment at 12:00pm, as he has been given a TTS schedule with a seat time of 12:00pm. On all following appointments, patient needs to arrive at 11:40am for his 12:00pm seat.  Patient requests transportation be arranged for him. Navigator assisted patient in completing [facility] [place] application, though patient states he can drive to his first appointment if needed. Renal Navigator informed Nephrologist, Renal NP (to request orders be sent to clinic) and Attending.  Patient cleared for discharge from OP HD standpoint.  Alphonzo Cruise, [place] Renal Navigator [phone]

## 2019-08-09 NOTE — Progress Notes (Signed)
   Subjective:  Pt seen at the bedside this AM during dialysis. He is ready to go home, but understands that he must wait for outpatient hemodialysis is set up. No complaints at this time.  Objective:  CBC Latest Ref Rng & Units 08/09/2019 08/05/2019 08/04/2019  WBC 4.0 - 10.5 K/uL 5.7 5.3 6.0  Hemoglobin 13.0 - 17.0 g/dL 9.5(L) 9.1(L) 8.9(L)  Hematocrit 39.0 - 52.0 % 29.3(L) 27.5(L) 27.5(L)  Platelets 150 - 400 K/uL 115(L) 80(L) 97(L)   BMP Latest Ref Rng & Units 08/09/2019 08/05/2019 08/04/2019  Glucose 70 - 99 mg/dL 122(H) 139(H) 121(H)  BUN 6 - 20 mg/dL 45(H) 69(H) 88(H)  Creatinine 0.61 - 1.24 mg/dL 8.00(H) 7.55(H) 7.92(H)  Sodium 135 - 145 mmol/L 136 140 139  Potassium 3.5 - 5.1 mmol/L 3.1(L) 3.3(L) 3.7  Chloride 98 - 111 mmol/L 98 104 107  CO2 22 - 32 mmol/L 23 21(L) 19(L)  Calcium 8.9 - 10.3 mg/dL 8.7(L) 8.5(L) 8.3(L)   Vital signs in last 24 hours: Vitals:   08/09/19 0930 08/09/19 1000 08/09/19 1030 08/09/19 1100  BP: 131/77 140/79 (!) 144/85 (!) 159/84  Pulse: 91 95 88 84  Resp: 20 20 20 20   Temp:      TempSrc:      SpO2:      Weight:      Height:       Physical Exam General: Laying in bed getting dialysis, NAD HEENT: NCAT CV: Normal S1-S2, no murmurs rubs or gallops appreciated.  No peripheral edema. Pulmonary: Clear to auscultation bilaterally, no crackles or wheezes appreciated ABD: Soft and nontender in all quadrants Neuro: Alert and oriented, nonfocal  Assessment/Plan:  Principal Problem:   Symptomatic anemia Active Problems:   Hypertension   DM2 (diabetes mellitus, type 2) (HCC)   Orthostatic hypotension   CKD (chronic kidney disease), stage V (HCC)   Acute on chronic anemia   Chronic kidney disease, stage V (HCC)   AVM (arteriovenous malformation) of small bowel, acquired with hemorrhage   ESRD (end stage renal disease) (HCC)  In summary, Mr. Snyders is a 59 year old male with a history of CKD V, now ESRD, DM II, and HCV who was admitted for CKD  progression to ESRD and symptomatic anemia.    #ESRD: Tolerating dialysis well.  Currently waiting on outpatient dialysis to be set up.  Per the renal coordinator, still no outpatient set up as of yet.  Status post tunnel catheter placement on 4/7 and started HD on 4/8.  The patient is medically stable for discharge. - Dilaudid 1 mg Q4H PRN   #Symptomatic Anemia #Orthostatic hypotension: Likley secondary to progression of CKD and chronic GI bleed. Upper endoscopy was performed on 4/6 and the bleeding resolved after ablation of small bowel AVM. S/p 2U pRBC when admitted. Hgb has been stable.  - F/u w/ GI in outpatient on 4/18 -Daily CBC  #FEN/GI -Diet: Renal diet with fluid restriction 1200 mL  -Fluids: none  #DVT prophylaxis -SCDs  #CODE STATUS: FULL  #Dispo:Anticipate discharge once outpatient dialysis is set up. Prior to Admission Living Arrangement:Home Anticipated Discharge Location:Home Barriers to [place] outpatient hemodialysis arrangement  Earlene Plater, MD Internal Medicine, PGY1 Pager: [phone]  08/09/2019,1:26 PM

## 2019-08-09 NOTE — Procedures (Signed)
Patient seen on Hemodialysis. BP (!) 160/87 (BP Location: Right Arm)   Pulse 78   Temp 97.7 F (36.5 C) (Oral)   Resp 20   Ht 5\' 11"  (1.803 m)   Wt 110.1 kg   SpO2 99%   BMI 33.85 kg/m   QB 400, UF goal 1L (revised from 2L after review of net UF so far)   Cramping on dialysis -- stop UF and give 278mL NS. Still awaiting financial clearance for OP HD.  Elmarie Shiley MD [facility]. Office # [phone] Pager # [phone] 10:12 AM

## 2019-08-09 NOTE — Discharge Summary (Signed)
Name: Daniel [name] MRN: [id number] DOB: 1960/05/20 59 y.o. PCP: [name], NP  Date of Admission: 07/31/2019  4:59 PM Date of Discharge: 08/09/2019 Attending Physician: Velna Ochs, MD  Discharge Diagnosis: 1. ESRD 2. Symptomatic Anemia  Discharge Medications: Allergies as of 08/09/2019   No Known Allergies     Medication List    TAKE these medications   amLODipine 10 MG tablet Commonly known as: NORVASC Take 1 tablet (10 mg total) by mouth daily.   blood glucose meter kit and supplies Kit Dispense based on patient and insurance preference. Use up to four times daily as directed. (FOR ICD-9 250.00, 250.01).   cloNIDine 0.2 MG tablet Commonly known as: CATAPRES Take  1 tablet twice daily . If makes drowsy take 2 tablets - after he is off of work What changed:   how much to take  how to take this  when to take this  additional instructions   ferrous sulfate 325 (65 FE) MG EC tablet Take 1 tablet (325 mg total) by mouth 2 (two) times daily.   furosemide 40 MG tablet Commonly known as: Lasix Take 1 tablet (40 mg total) by mouth daily.   gabapentin 300 MG capsule Commonly known as: NEURONTIN Take 300 mg by mouth See admin instructions. Take 326m twice daily and 6029mat bedtime.   hydrochlorothiazide 25 MG tablet Commonly known as: HYDRODIURIL Take 25 mg by mouth daily.   HYDROmorphone 2 MG tablet Commonly known as: Dilaudid Take 0.5 tablets (1 mg total) by mouth every 6 (six) hours as needed for moderate pain or severe pain.   sodium bicarbonate 650 MG tablet Take 2 tablets (1,300 mg total) by mouth 2 (two) times daily.   vitamin B-12 1000 MCG tablet Commonly known as: CYANOCOBALAMIN Take 1 tablet (1,000 mcg total) by mouth daily.      Disposition and follow-up:   [name]  At the hospital follow up visit please address:  1. ESRD: Patient  presented with fatigue, dizziness,and generalized weakness and SOB.  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  S/P tunnel catheter and L arm fistua placement on 4/7 and started HD on 4/8.  Has outpatient follow-up scheduled for ongoing dialysis.  -Next HD session on 4/15 at 11 AM. -F/u with vascular surgery in 4-6 weeks with Dr. CaDonzetta Matters2. Symptomatic Anemia: Hgb on admission was 7.1. Patient was lightheaded, dizzy, weak and had dyspnea with exertion.  Patient has had chronic melena and has required transfusions in the past.  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  He received 2 units of PRBCs and his hemoglobin became stable. -f/u with GI on 4/18  3.  Labs / imaging needed at time of follow-up: CBC  4.  Pending labs/ test needing follow-up: none  Follow-up Appointments: Follow-up Information    [facility] In 6 weeks.   Specialty: Vascular Surgery Why: Office will call you to arrange your appt (sent) Contact information: [phone]     ArYetta FlockMD Follow up on 09/13/2019.   Specialty: Gastroenterology Why: For follow up with GI.  appointment is at 9:50 AM.   Contact information: [address] [phone]          Hospital Course by problem list: 1. ESRD 2.  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  The patient presented with symptomatic anemia with a hemoglobin of 7.1.  An upper endoscopy with GI demonstrated AVMs within the small bowel that were cauterized.  Patient's hemoglobin became stable after receiving 2 units of PRBCs and ablations.  The patient also had  a tunneled catheter and left arm fistula placed on 4/7 and started hemodialysis on 4/8.  The patient tolerated his hemodialysis sessions well, with and he is to continue dialysis in the outpatient setting.  Patient is to follow-up with nephrology in the outpatient setting for dialysis on 4/15 and with GI on 4/18.  Vascular surgery will also follow with the patient to evaluate his fistula in 4-6 weeks.  Discharge Vitals:   BP (!) 141/77 (BP Location: Right Arm)   Pulse 77   Temp 98.2 F (36.8 C) (Oral)   Resp 19   Ht _0  (1.803 m)   Wt 109.4 kg   SpO2 99%   BMI 33.64 kg/m   Pertinent Labs, Studies, and Procedures:  CBC Latest Ref Rng & Units 08/09/2019 08/05/2019 08/04/2019  WBC 4.0 - 10.5 K/uL 5.7 5.3 6.0  Hemoglobin 13.0 - 17.0 g/dL 9.5(L) 9.1(L) 8.9(L)  Hematocrit 39.0 - 52.0 % 29.3(L) 27.5(L) 27.5(L)  Platelets 150 - 400 K/uL 115(L) 80(L) 97(L)   BMP Latest Ref Rng & Units 08/09/2019 08/05/2019 08/04/2019  Glucose 70 - 99 mg/dL 122(H) 139(H) 121(H)  BUN 6 - 20 mg/dL 45(H) 69(H) 88(H)  Creatinine 0.61 - 1.24 mg/dL 8.00(H) 7.55(H) 7.92(H)  Sodium 135 - 145 mmol/L 136 140 139  Potassium 3.5 - 5.1 mmol/L 3.1(L) 3.3(L) 3.7  Chloride 98 - 111 mmol/L 98 104 107  CO2 22 - 32 mmol/L 23 21(L) 19(L)  Calcium 8.9 - 10.3 mg/dL 8.7(L) 8.5(L) 8.3(L)   CXR: IMPRESSION: 1. Tip of the right internal jugular dialysis catheter in the mid-lower SVC. No pneumothorax. 2. Stable cardiomegaly. Vascular congestion with suggestion of mild pulmonary edema.  Discharge Instructions: Discharge Instructions    Call MD for:  difficulty breathing, headache or visual disturbances   Complete by: As directed    Call MD for:  extreme fatigue   Complete by: As directed    Call MD for:  hives   Complete by: As directed    Call MD for:  persistant dizziness or light-headedness   Complete by: As directed    Call MD for:  persistant nausea and vomiting   Complete by: As directed    Call MD for:  redness,  tenderness, or signs of infection (pain, swelling, redness, odor or green/yellow discharge around incision site)   Complete by: As directed    Call MD for:  severe uncontrolled pain   Complete by: As directed    Call MD for:  temperature >100.4   Complete by: As directed    Diet - low sodium heart healthy   Complete by: As directed    Discharge instructions   Complete by: As directed    Thank you for allowing [place] to take care of you during your hospitalization.  Below is a summary of what we treated:  1.  End-stage renal disease -You will need to get dialysis in the outpatient setting indefinitely.  Your first appointment  is at 11 AM on Thursday.  Please make sure you make it to dialysis  2.  Anemia -Your blood counts were really low when you first came in.  You got 2 units of blood while you were here and your blood counts remained stable. -The GI doctors looked in your intestines and found small lesions that were bleeding.  This is likely the source of why your blood counts were low.  If you start having black stools and fatigue again, it may be a sign that you are bleeding again.  3.  Follow-up -Please go to dialysis when you are scheduled to do so. -Please follow-up with the GI doctors in outpatient setting.  You have an appointment on 5/18 at 9:50 AM -Please make an appointment with your primary care provider as soon as possible so they can help manage your medical problems moving forward. -The vascular surgeons want to see you in their office in 4 to 6 weeks to make sure your fistula is healing properly.  Please schedule an appointment to have this done.   Increase activity slowly   Complete by: As directed      Signed: Earlene Plater, MD Internal Medicine, PGY1 Pager: [phone]  08/10/2019,12:42 PM

## 2019-08-09 NOTE — Plan of Care (Signed)
  Problem: Nutrition: Goal: Adequate nutrition will be maintained Outcome: Adequate for Discharge   Problem: Health Behavior/Discharge Planning: Goal: Ability to manage health-related needs will improve Outcome: Adequate for Discharge

## 2019-08-09 NOTE — Plan of Care (Signed)
  Problem: Nutritional: Goal: Ability to make appropriate dietary choices will improve Outcome: Completed/Met

## 2019-08-10 ENCOUNTER — Telehealth: Payer: Self-pay

## 2019-08-10 NOTE — Telephone Encounter (Signed)
Transition Care Management Follow-up Telephone Call Date of discharge and from where: 08/09/2019, [facility].  Call placed to patient # [phone], message left with call back requested to this CM.  Call placed to # [phone] and the person who answered said it was a wrong number and it was removed from [place].  Call placed to patient's brother, [name] # [phone] and he said that the patient was home.   A message has already been left at his home number.  Patient has an appointment at [facility] on 08/16/2019 but that is a dialysis day and he will need to change the appointment

## 2019-08-11 ENCOUNTER — Telehealth: Payer: Self-pay

## 2019-08-11 NOTE — Telephone Encounter (Signed)
Transition Care Management Follow-up Telephone Call Attempt #2   Date of discharge and from where: 08/09/2019, [facility].  Call placed to patient # [phone], message left with call back requested to this CM.   Patient has an appointment at [facility] on 08/16/2019 but that is a dialysis day and he will need to change the appointment

## 2019-08-15 ENCOUNTER — Ambulatory Visit (INDEPENDENT_AMBULATORY_CARE_PROVIDER_SITE_OTHER): Payer: Self-pay | Admitting: Primary Care

## 2019-08-16 ENCOUNTER — Other Ambulatory Visit: Payer: Self-pay

## 2019-08-16 ENCOUNTER — Encounter (INDEPENDENT_AMBULATORY_CARE_PROVIDER_SITE_OTHER): Payer: Self-pay | Admitting: Primary Care

## 2019-08-16 ENCOUNTER — Ambulatory Visit (INDEPENDENT_AMBULATORY_CARE_PROVIDER_SITE_OTHER): Payer: Self-pay | Admitting: Primary Care

## 2019-08-16 VITALS — BP 118/75 | HR 110 | Temp 97.0°F | Ht 71.0 in | Wt 248.0 lb

## 2019-08-16 DIAGNOSIS — L299 Pruritus, unspecified: Secondary | ICD-10-CM

## 2019-08-16 DIAGNOSIS — I10 Essential (primary) hypertension: Secondary | ICD-10-CM

## 2019-08-16 DIAGNOSIS — N186 End stage renal disease: Secondary | ICD-10-CM

## 2019-08-16 DIAGNOSIS — F1010 Alcohol abuse, uncomplicated: Secondary | ICD-10-CM

## 2019-08-16 DIAGNOSIS — Z720 Tobacco use: Secondary | ICD-10-CM

## 2019-08-16 MED ORDER — HYDROXYZINE HCL 10 MG PO TABS: 10.0000 mg | ORAL_TABLET | Freq: Three times a day (TID) | ORAL | 0 refills | Status: DC | PRN

## 2019-08-16 MED ORDER — GABAPENTIN 300 MG PO CAPS: 300.0000 mg | ORAL_CAPSULE | ORAL | 2 refills | Status: DC

## 2019-08-16 NOTE — Progress Notes (Signed)
Established Patient Office Visit  Subjective:  Patient ID: Daniel [name], male    DOB: 27-Jun-1960  Age: 59 y.o. MRN: [id number]  CC:  Chief Complaint  Patient presents with  . Hospitalization Follow-up    anemia, orthostatic hypotension, CKD  . Pruritis    HPI [name] is a 59 year old African American male with stage V kidney disease requiring dialysis 3 times a week. His blood pressure is excellent today 118/75 and this is after HD. Recently admitted to the hospital for symptomatic anemic requiring blood transfusion.  He presents today for pruritus all over especially after dialysis .Asked him to inform [facility] about this too.  Past Medical History:  Diagnosis Date  . Diabetes mellitus without complication ([place])   . Hypertension   . ICH (intracerebral hemorrhage) ([place]) 05/20/2017  . Shoulder pain, left 06/28/2013    Past Surgical History:  Procedure Laterality Date  . APPENDECTOMY    . AV FISTULA PLACEMENT Left 08/03/2019   Procedure: LEFT ARM ARTERIOVENOUS (AV) CEPHALIC  FISTULA CREATION;  Surgeon: Waynetta Sandy, MD;  Location: [place];  Service: Vascular;  Laterality: Left;  . BIOPSY  06/30/2019   Procedure: BIOPSY;  Surgeon: Ronald Lobo, MD;  Location: [place];  Service: Endoscopy;;  . BIOPSY  08/02/2019   Procedure: BIOPSY;  Surgeon: Yetta Flock, MD;  Location: [place];  Service: Gastroenterology;;  . COLONOSCOPY  01/23/2012   Procedure: COLONOSCOPY;  Surgeon: Danie Binder, MD;  [facility];  Service: Endoscopy;  Laterality: N/A;  11:10 AM  . COLONOSCOPY WITH PROPOFOL N/A 06/30/2019   Procedure: COLONOSCOPY WITH PROPOFOL;  Surgeon: Ronald Lobo, MD;  Location: [place];  Service: Endoscopy;  Laterality: N/A;  . ENTEROSCOPY N/A 08/02/2019   Procedure: ENTEROSCOPY;  Surgeon: Yetta Flock, MD;  Location: [facility] ENDOSCOPY;  Service: Gastroenterology;  Laterality: N/A;  . ESOPHAGOGASTRODUODENOSCOPY (EGD)  WITH PROPOFOL N/A 06/30/2019   Procedure: ESOPHAGOGASTRODUODENOSCOPY (EGD) WITH PROPOFOL;  Surgeon: Ronald Lobo, MD;  Location: [place];  Service: Endoscopy;  Laterality: N/A;  . GIVENS CAPSULE STUDY N/A 06/30/2019   Procedure: GIVENS CAPSULE STUDY;  Surgeon: Ronald Lobo, MD;  Location: [place];  Service: Endoscopy;  Laterality: N/A;  . HEMOSTASIS CONTROL  08/02/2019   Procedure: HEMOSTASIS CONTROL;  Surgeon: Yetta Flock, MD;  Location: [place] ENDOSCOPY;  Service: Gastroenterology;;  . INCISION AND DRAINAGE ABSCESS N/A 06/29/2016   Procedure: INCISION AND DRAINAGE ABDOMINAL WALL ABSCESS;  Surgeon: Alphonsa Overall, MD;  Location: [facility];  Service: General;  Laterality: N/A;  . INSERTION OF DIALYSIS CATHETER Right 08/03/2019   Procedure: INSERTION OF DIALYSIS CATHETER;  Surgeon: Waynetta Sandy, MD;  Location: [place];  Service: Vascular;  Laterality: Right;  . Left heel surgery      Family History  Problem Relation Age of Onset  . Colon cancer Neg Hx     Social History   Socioeconomic History  . Marital status: Single    Spouse name: Not on file  . Number of children: Not on file  . Years of education: Not on file  . Highest education level: Not on file  Occupational History  . Not on file  Tobacco Use  . Smoking status: Current Every Day Smoker    Packs/day: 1.00    Years: 30.00    Pack years: 30.00    Types: Cigarettes  . Smokeless tobacco: Never Used  Substance and Sexual Activity  . Alcohol use: Yes  Alcohol/week: 12.0 standard drinks    Types: 12 Cans of beer per week  . Drug use: Yes    Types: "Crack" cocaine  . Sexual activity: Yes    Birth control/protection: None  Other Topics Concern  . Not on file  Social History Narrative  . Not on file   Social Determinants of Health   Financial Resource Strain:   . Difficulty of Paying Living Expenses:   Food Insecurity:   . Worried About Charity fundraiser in the Last Year:   . Arboriculturist  in the Last Year:   Transportation Needs:   . Film/video editor (Medical):   [name] Lack of Transportation (Non-Medical):   Physical Activity:   . Days of Exercise per Week:   . Minutes of Exercise per Session:   Stress:   . Feeling of Stress :   Social Connections:   . Frequency of Communication with Friends and Family:   . Frequency of Social Gatherings with Friends and Family:   . Attends Religious Services:   . Active Member of Clubs or Organizations:   . Attends Archivist Meetings:   [name] Marital Status:   Intimate Partner Violence:   . Fear of Current or Ex-Partner:   . Emotionally Abused:   [name] Physically Abused:   . Sexually Abused:     Outpatient Medications Prior to Visit  Medication Sig Dispense Refill  . amLODipine (NORVASC) 10 MG tablet Take 1 tablet (10 mg total) by mouth daily. 90 tablet 1  . cloNIDine (CATAPRES) 0.2 MG tablet Take  1 tablet twice daily . If makes drowsy take 2 tablets - after he is off of work (Patient taking differently: Take 0.2 mg by mouth See admin instructions. Take 1 tablet (0.2 mg) by mouth twice daily, if it makes you drowsy take 2 tablets (0.4 mg) after work) 180 tablet 1  . furosemide (LASIX) 40 MG tablet Take 1 tablet (40 mg total) by mouth daily. 30 tablet 0  . gabapentin (NEURONTIN) 300 MG capsule Take 300 mg by mouth See admin instructions. Take 366m twice daily and 6057mat bedtime.    . blood glucose meter kit and supplies KIT Dispense based on patient and insurance preference. Use up to four times daily as directed. (FOR ICD-9 250.00, 250.01). 1 each 0  . chlorthalidone (HYGROTON) 50 MG tablet Take 50 mg by mouth daily.    . ferrous sulfate 325 (65 FE) MG EC tablet Take 1 tablet (325 mg total) by mouth 2 (two) times daily. 60 tablet 1  . folic acid (FOLVITE) 1 MG tablet Take 1 mg by mouth daily.    . hydrochlorothiazide (HYDRODIURIL) 25 MG tablet Take 25 mg by mouth daily.    . Marland KitchenYDROmorphone (DILAUDID) 2 MG tablet Take 0.5  tablets (1 mg total) by mouth every 6 (six) hours as needed for moderate pain or severe pain. (Patient not taking: Reported on 08/16/2019) 12 tablet 0  . sodium bicarbonate 325 MG tablet Take 650 mg by mouth 2 (two) times daily.    . sodium bicarbonate 650 MG tablet Take 2 tablets (1,300 mg total) by mouth 2 (two) times daily. (Patient not taking: Reported on 08/16/2019) 120 tablet 0   No facility-administered medications prior to visit.    No Known Allergies  ROS Review of Systems  Skin:        Pruritis worst after dialysis    All other systems reviewed and are negative.  Objective:    Physical Exam  Constitutional: He is oriented to person, place, and time. He appears well-developed and well-nourished.  HENT:  Head: Normocephalic and atraumatic.  Cardiovascular: Normal rate and regular rhythm.  Pulmonary/Chest: Effort normal and breath sounds normal.  Abdominal: Soft. Bowel sounds are normal.  Musculoskeletal:        General: Normal range of motion.     Cervical back: Normal range of motion and neck supple.  Neurological: He is oriented to person, place, and time.  Skin: Skin is warm and dry.  Psychiatric: He has a normal mood and affect. His behavior is normal. Judgment and thought content normal.    BP 118/75 (BP Location: Left Arm, Patient Position: Sitting, Cuff Size: Large)   Pulse (!) 110   Temp (!) 97 F (36.1 C) (Temporal)   Ht 5' 11"  (1.803 m)   Wt 248 lb (112.5 kg)   SpO2 97%   BMI 34.59 kg/m  Wt Readings from Last 3 Encounters:  08/16/19 248 lb (112.5 kg)  08/09/19 241 lb 2.9 oz (109.4 kg)  07/25/19 264 lb 12.4 oz (120.1 kg)     Health Maintenance Due  Topic Date Due  . FOOT EXAM  Never done  . OPHTHALMOLOGY EXAM  Never done  . COVID-19 Vaccine (1) Never done    There are no preventive care reminders to display for this patient.  Lab Results  Component Value Date   TSH 2.833 06/29/2016   Lab Results  Component Value Date   WBC 5.7  08/09/2019   HGB 9.5 (L) 08/09/2019   HCT 29.3 (L) 08/09/2019   MCV 102.1 (H) 08/09/2019   PLT 115 (L) 08/09/2019   Lab Results  Component Value Date   NA 136 08/09/2019   K 3.1 (L) 08/09/2019   CO2 23 08/09/2019   GLUCOSE 122 (H) 08/09/2019   BUN 45 (H) 08/09/2019   CREATININE 8.00 (H) 08/09/2019   BILITOT 0.6 08/01/2019   ALKPHOS 58 08/01/2019   AST 111 (H) 08/01/2019   ALT 44 08/01/2019   PROT 7.0 08/01/2019   ALBUMIN 2.6 (L) 08/09/2019   CALCIUM 8.7 (L) 08/09/2019   ANIONGAP 15 08/09/2019   Lab Results  Component Value Date   CHOL 140 05/21/2017   Lab Results  Component Value Date   HDL 39 (L) 05/21/2017   Lab Results  Component Value Date   LDLCALC 50 05/21/2017   Lab Results  Component Value Date   TRIG 220 (H) 05/22/2017   Lab Results  Component Value Date   CHOLHDL 3.6 05/21/2017   Lab Results  Component Value Date   HGBA1C 5.2 06/23/2019      Assessment & Plan:  Danell was seen today for hospitalization follow-up and pruritis.  Diagnoses and all orders for this visit:  Essential hypertension We have discussed target BP range and blood pressure goal. I have advised patient to check BP regularly and to call us back or report to clinic if the numbers are consistently higher than 130/80. We discussed the importance of compliance with medical therapy and DASH diet recommended, consequences of uncontrolled hypertension discussed.  - continue current BP medications  ESRD (end stage renal disease) ([place]) Chronic kidney disease stage V. Followed by [facility] .  Pruritic dermatitis Prescribing hydroxyzine 51m three times a day before and after dialysis . Pruritus only occurs with dialysis per patient   Tobacco abuse He is aware of increased risk for lung cancer and other respiratory diseases recommend cessation.  This will be reminded at each clinical visit.  -     gabapentin (NEURONTIN) 300 MG capsule; Take 1 capsule (300 mg total) by mouth  See admin instructions. Take 352m twice daily and 60100mat bedtime. -     hydrOXYzine (ATARAX/VISTARIL) 10 MG tablet; Take 1 tablet (10 mg total) by mouth 3 (three) times daily as needed.   Meds ordered this encounter  Medications  . gabapentin (NEURONTIN) 300 MG capsule    Sig: Take 1 capsule (300 mg total) by mouth See admin instructions. Take 30064mwice daily and 600m46m bedtime.    Dispense:  90 capsule    Refill:  2  . hydrOXYzine (ATARAX/VISTARIL) 10 MG tablet    Sig: Take 1 tablet (10 mg total) by mouth 3 (three) times daily as needed.    Dispense:  30 tablet    Refill:  0    Follow-up: Return in about 3 months (around 11/15/2019) for Htn in person and tobacco abuse .    MichKerin Perna

## 2019-08-18 ENCOUNTER — Telehealth: Payer: Self-pay

## 2019-08-18 NOTE — Telephone Encounter (Signed)
Call placed to patient # [phone] at request of [name], NP.  She noted that he had questions about paying for his medications and his bills.  Message left with call back requested to this CM

## 2019-09-13 ENCOUNTER — Other Ambulatory Visit: Payer: Self-pay | Admitting: *Deleted

## 2019-09-13 ENCOUNTER — Ambulatory Visit: Payer: Self-pay | Admitting: Gastroenterology

## 2019-09-13 DIAGNOSIS — N185 Chronic kidney disease, stage 5: Secondary | ICD-10-CM

## 2019-09-16 ENCOUNTER — Inpatient Hospital Stay (HOSPITAL_COMMUNITY): Admit: 2019-09-16 | Payer: Self-pay

## 2019-10-04 ENCOUNTER — Other Ambulatory Visit: Payer: Self-pay | Admitting: *Deleted

## 2019-10-04 DIAGNOSIS — N185 Chronic kidney disease, stage 5: Secondary | ICD-10-CM

## 2019-10-07 ENCOUNTER — Ambulatory Visit (HOSPITAL_COMMUNITY): Payer: Self-pay | Attending: Vascular Surgery

## 2019-10-12 ENCOUNTER — Other Ambulatory Visit: Payer: Self-pay | Admitting: *Deleted

## 2019-10-12 DIAGNOSIS — N185 Chronic kidney disease, stage 5: Secondary | ICD-10-CM

## 2019-10-14 ENCOUNTER — Other Ambulatory Visit: Payer: Self-pay

## 2019-10-14 ENCOUNTER — Other Ambulatory Visit (HOSPITAL_COMMUNITY)
Admission: RE | Admit: 2019-10-14 | Discharge: 2019-10-14 | Disposition: A | Discharge status: Home / Self Care | Payer: Medicaid Other | Source: Ambulatory Visit | Attending: Vascular Surgery | Admitting: Vascular Surgery

## 2019-10-14 ENCOUNTER — Ambulatory Visit (HOSPITAL_COMMUNITY)
Admission: RE | Admit: 2019-10-14 | Discharge: 2019-10-14 | Disposition: A | Discharge status: Home / Self Care | Payer: Medicaid Other | Source: Ambulatory Visit | Attending: Vascular Surgery | Admitting: Vascular Surgery

## 2019-10-14 ENCOUNTER — Ambulatory Visit (INDEPENDENT_AMBULATORY_CARE_PROVIDER_SITE_OTHER): Payer: Self-pay | Admitting: Physician Assistant

## 2019-10-14 VITALS — BP 144/81 | HR 80 | Temp 98.1°F | Resp 20 | Ht 71.0 in | Wt 257.5 lb

## 2019-10-14 DIAGNOSIS — N185 Chronic kidney disease, stage 5: Secondary | ICD-10-CM

## 2019-10-14 DIAGNOSIS — I770 Arteriovenous fistula, acquired: Secondary | ICD-10-CM | POA: Insufficient documentation

## 2019-10-14 DIAGNOSIS — Z20822 Contact with and (suspected) exposure to covid-19: Secondary | ICD-10-CM | POA: Diagnosis not present

## 2019-10-14 DIAGNOSIS — Z01818 Encounter for other preprocedural examination: Secondary | ICD-10-CM | POA: Diagnosis not present

## 2019-10-14 NOTE — Progress Notes (Signed)
  POST OPERATIVE OFFICE NOTE    CC:  F/u for surgery  HPI:  This is a 59 y.o. male who is s/p right IJ 19 cm tunneled dialysis catheter placement with ultrasound guidance and creation of left brachial artery to cephalic vein AV fistula by Dr. Donzetta Matters on 08/03/2019. No complications with catheter. No hand pain.  He dialyzes T/T/S.   No Known Allergies  Current Outpatient Medications  Medication Sig Dispense Refill  . amLODipine (NORVASC) 10 MG tablet Take 1 tablet (10 mg total) by mouth daily. 90 tablet 1  . blood glucose meter kit and supplies KIT Dispense based on patient and insurance preference. Use up to four times daily as directed. (FOR ICD-9 250.00, 250.01). 1 each 0  . chlorthalidone (HYGROTON) 50 MG tablet Take 50 mg by mouth daily.    . cloNIDine (CATAPRES) 0.2 MG tablet Take  1 tablet twice daily . If makes drowsy take 2 tablets - after he is off of work (Patient taking differently: Take 0.2 mg by mouth See admin instructions. Take 1 tablet (0.2 mg) by mouth twice daily, if it makes you drowsy take 2 tablets (0.4 mg) after work) 262 tablet 1  . folic acid (FOLVITE) 1 MG tablet Take 1 mg by mouth daily.    [name] gabapentin (NEURONTIN) 300 MG capsule Take 1 capsule (300 mg total) by mouth See admin instructions. Take 344m twice daily and 6047mat bedtime. 90 capsule 2  . hydrochlorothiazide (HYDRODIURIL) 25 MG tablet Take 25 mg by mouth daily.    . Marland KitchenYDROmorphone (DILAUDID) 2 MG tablet Take 0.5 tablets (1 mg total) by mouth every 6 (six) hours as needed for moderate pain or severe pain. 12 tablet 0  . hydrOXYzine (ATARAX/VISTARIL) 10 MG tablet Take 1 tablet (10 mg total) by mouth 3 (three) times daily as needed. 30 tablet 0  . sodium bicarbonate 325 MG tablet Take 650 mg by mouth 2 (two) times daily.    . sodium bicarbonate 650 MG tablet Take 2 tablets (1,300 mg total) by mouth 2 (two) times daily. 120 tablet 0  . ferrous sulfate 325 (65 FE) MG EC tablet Take 1 tablet (325 mg total) by  mouth 2 (two) times daily. 60 tablet 1  . furosemide (LASIX) 40 MG tablet Take 1 tablet (40 mg total) by mouth daily. 30 tablet 0   No current facility-administered medications for this visit.     ROS:  See HPI Vitals:   10/14/19 1045  BP: (!) 144/81  Pulse: 80  Resp: 20  Temp: 98.1 F (36.7 C)  SpO2: 98%   Physical Exam:  General appearance: WD, WN in NAD Cardiac:RRR Respiratory:CTAB Incision:  Well healed Extremities:  Left upper arm fistula with palpable thrill. Good bruit Neuro: A and O times 4   Assessment/Plan:  This is a [age].o. male who is s/p: left B-C AVF.  His fistula diameter is adequate but the fistula is too deep to access.  Will need superficialization and ligation of competing side branches.  I explained how this is done and will be done as an outpatient as long as he did well.  We will plan this on a non-dialysis day.  SaRisa GrillPA-C [facility] [phone] MD:  CaDonzetta Matters

## 2019-10-14 NOTE — Progress Notes (Signed)
II was unable to reach patient.  I left  A message on voice mail.  I instructed the patient to arrive at [place] entrance at [phone]  , register in the [place]. DO NOT eat or drink anything after midnight.  I instructed the patient to take the following medications in the am with just enough water to get them down:  Amlodipine, Clonidine, Gabapentin, Sodium Bicarb. I instructed patient to shower, wear clean clothes, brush teeth.  Do not wear any lotion, powders or colognes and to bring any valuables. Glasses, contacts, hearing aids, or dentures, partials are not allowed to be worn in the OR. I informed patient that there will need to be a driver and someone to stay with him/her for the first 24 hours after surgery.   I instructed  patient to call [phone], in the am if there were any questions or problems.

## 2019-10-15 LAB — SARS CORONAVIRUS 2 (TAT 6-24 HRS): SARS Coronavirus 2: NEGATIVE

## 2019-10-15 NOTE — Progress Notes (Signed)
Per the lab, the covid results should be resulted by 12:00pm today.

## 2019-10-16 NOTE — Anesthesia Preprocedure Evaluation (Addendum)
Anesthesia Evaluation  Patient identified by MRN, date of birth, ID band Patient awake    Reviewed: Allergy & Precautions, NPO status , Patient's Chart, lab work & pertinent test results  History of Anesthesia Complications Negative for: history of anesthetic complications  Airway Mallampati: III  TM Distance: >3 FB Neck ROM: Full    Dental  (+) Poor Dentition, Missing   Pulmonary Current Smoker,    Pulmonary exam normal        Cardiovascular hypertension, Pt. on medications Normal cardiovascular exam     Neuro/Psych ICH (intracerebral hemorrhage)     GI/Hepatic negative GI ROS, (+)     substance abuse  ,   Endo/Other  diabetes  Renal/GU ESRFRenal disease     Musculoskeletal negative musculoskeletal ROS (+)   Abdominal (+) - obese,   Peds  Hematology  (+) anemia ,   Anesthesia Other Findings END STAGE RENAL DISEASE  Reproductive/Obstetrics                            Anesthesia Physical  Anesthesia Plan  ASA: III  Anesthesia Plan: Regional   Post-op Pain Management:    Induction: Intravenous  PONV Risk Score and Plan: 0 and Ondansetron, Dexamethasone, Propofol infusion and Treatment may vary due to age or medical condition  Airway Management Planned: Nasal Cannula and Natural Airway  Additional Equipment:   Intra-op Plan:   Post-operative Plan:   Informed Consent: I have reviewed the patients History and Physical, chart, labs and discussed the procedure including the risks, benefits and alternatives for the proposed anesthesia with the patient or authorized representative who has indicated his/her understanding and acceptance.     Dental advisory given  Plan Discussed with: Anesthesiologist and CRNA  Anesthesia Plan Comments:        Anesthesia Quick Evaluation

## 2019-10-17 ENCOUNTER — Other Ambulatory Visit: Payer: Self-pay

## 2019-10-17 ENCOUNTER — Ambulatory Visit (HOSPITAL_COMMUNITY): Payer: Medicaid Other | Admitting: Anesthesiology

## 2019-10-17 ENCOUNTER — Ambulatory Visit (HOSPITAL_COMMUNITY)
Admission: RE | Admit: 2019-10-17 | Discharge: 2019-10-17 | Disposition: A | Discharge status: Home / Self Care | Payer: Medicaid Other | Attending: Vascular Surgery | Admitting: Vascular Surgery

## 2019-10-17 ENCOUNTER — Encounter (HOSPITAL_COMMUNITY): Payer: Self-pay | Admitting: Vascular Surgery

## 2019-10-17 ENCOUNTER — Encounter (HOSPITAL_COMMUNITY)
Admission: RE | Disposition: A | Discharge status: Home / Self Care | Payer: Self-pay | Source: Home / Self Care | Attending: Vascular Surgery

## 2019-10-17 DIAGNOSIS — Z79899 Other long term (current) drug therapy: Secondary | ICD-10-CM | POA: Diagnosis not present

## 2019-10-17 DIAGNOSIS — T82510A Breakdown (mechanical) of surgically created arteriovenous fistula, initial encounter: Secondary | ICD-10-CM | POA: Diagnosis not present

## 2019-10-17 DIAGNOSIS — Y841 Kidney dialysis as the cause of abnormal reaction of the patient, or of later complication, without mention of misadventure at the time of the procedure: Secondary | ICD-10-CM | POA: Insufficient documentation

## 2019-10-17 DIAGNOSIS — Z992 Dependence on renal dialysis: Secondary | ICD-10-CM | POA: Diagnosis not present

## 2019-10-17 DIAGNOSIS — T82898A Other specified complication of vascular prosthetic devices, implants and grafts, initial encounter: Secondary | ICD-10-CM | POA: Diagnosis not present

## 2019-10-17 DIAGNOSIS — N186 End stage renal disease: Secondary | ICD-10-CM | POA: Diagnosis not present

## 2019-10-17 HISTORY — PX: FISTULA SUPERFICIALIZATION: SHX6341

## 2019-10-17 LAB — GLUCOSE, CAPILLARY
Glucose-Capillary: 121 mg/dL — ABNORMAL HIGH (ref 70–99)
Glucose-Capillary: 81 mg/dL (ref 70–99)
Glucose-Capillary: 77 mg/dL (ref 70–99)
Glucose-Capillary: 99 mg/dL (ref 70–99)

## 2019-10-17 LAB — POCT I-STAT, CHEM 8
BUN: 42 mg/dL — ABNORMAL HIGH (ref 6–20)
Calcium, Ion: 1.07 mmol/L — ABNORMAL LOW (ref 1.15–1.40)
Chloride: 100 mmol/L (ref 98–111)
Creatinine, Ser: 10 mg/dL — ABNORMAL HIGH (ref 0.61–1.24)
Glucose, Bld: 102 mg/dL — ABNORMAL HIGH (ref 70–99)
HCT: 26 % — ABNORMAL LOW (ref 39.0–52.0)
Hemoglobin: 8.8 g/dL — ABNORMAL LOW (ref 13.0–17.0)
Potassium: 3.7 mmol/L (ref 3.5–5.1)
Sodium: 139 mmol/L (ref 135–145)
TCO2: 27 mmol/L (ref 22–32)

## 2019-10-17 SURGERY — FISTULA SUPERFICIALIZATION
Anesthesia: Regional | Site: Arm Upper | Laterality: Left

## 2019-10-17 MED ORDER — PROPOFOL 500 MG/50ML IV EMUL
INTRAVENOUS | Status: DC | PRN
  Administered 2019-10-17: 100 ug/kg/min via INTRAVENOUS

## 2019-10-17 MED ORDER — FENTANYL CITRATE (PF) 100 MCG/2ML IJ SOLN
INTRAMUSCULAR | Status: AC
  Administered 2019-10-17: 100 ug via INTRAVENOUS
  Filled 2019-10-17: qty 2

## 2019-10-17 MED ORDER — ORAL CARE MOUTH RINSE: 15.0000 mL | Freq: Once | OROMUCOSAL | Status: AC

## 2019-10-17 MED ORDER — ACETAMINOPHEN 500 MG PO TABS
1000.0000 mg | ORAL_TABLET | Freq: Once | ORAL | Status: AC
  Administered 2019-10-17: 1000 mg via ORAL
  Filled 2019-10-17: qty 2

## 2019-10-17 MED ORDER — SODIUM CHLORIDE 0.9 % IV SOLN: INTRAVENOUS | Status: DC

## 2019-10-17 MED ORDER — LIDOCAINE-EPINEPHRINE (PF) 1.5 %-1:200000 IJ SOLN
INTRAMUSCULAR | Status: DC | PRN
  Administered 2019-10-17: 30 mL via PERINEURAL

## 2019-10-17 MED ORDER — CELECOXIB 200 MG PO CAPS
200.0000 mg | ORAL_CAPSULE | Freq: Once | ORAL | Status: AC
  Administered 2019-10-17: 200 mg via ORAL
  Filled 2019-10-17: qty 1

## 2019-10-17 MED ORDER — OXYCODONE-ACETAMINOPHEN 5-325 MG PO TABS: 1.0000 | ORAL_TABLET | Freq: Four times a day (QID) | ORAL | 0 refills | Status: DC | PRN

## 2019-10-17 MED ORDER — FENTANYL CITRATE (PF) 100 MCG/2ML IJ SOLN: 100.0000 ug | Freq: Once | INTRAMUSCULAR | Status: AC

## 2019-10-17 MED ORDER — LIDOCAINE HCL (PF) 1 % IJ SOLN
INTRAMUSCULAR | Status: AC
  Filled 2019-10-17: qty 30

## 2019-10-17 MED ORDER — CEFAZOLIN SODIUM-DEXTROSE 2-4 GM/100ML-% IV SOLN
2.0000 g | INTRAVENOUS | Status: AC
  Administered 2019-10-17: 2 g via INTRAVENOUS
  Filled 2019-10-17: qty 100

## 2019-10-17 MED ORDER — CHLORHEXIDINE GLUCONATE 4 % EX LIQD: 60.0000 mL | Freq: Once | CUTANEOUS | Status: DC

## 2019-10-17 MED ORDER — SODIUM CHLORIDE 0.9 % IV SOLN
INTRAVENOUS | Status: AC
  Filled 2019-10-17: qty 1.2

## 2019-10-17 MED ORDER — ONDANSETRON HCL 4 MG/2ML IJ SOLN
INTRAMUSCULAR | Status: DC | PRN
Start: 2019-10-17 — End: 2019-10-17
  Administered 2019-10-17: 4 mg via INTRAVENOUS

## 2019-10-17 MED ORDER — DEXAMETHASONE SODIUM PHOSPHATE 4 MG/ML IJ SOLN
INTRAMUSCULAR | Status: DC | PRN
Start: 2019-10-17 — End: 2019-10-17
  Administered 2019-10-17: 10 mg via PERINEURAL

## 2019-10-17 MED ORDER — MIDAZOLAM HCL 2 MG/2ML IJ SOLN: 2.0000 mg | Freq: Once | INTRAMUSCULAR | Status: AC

## 2019-10-17 MED ORDER — 0.9 % SODIUM CHLORIDE (POUR BTL) OPTIME
TOPICAL | Status: DC | PRN
  Administered 2019-10-17: 1000 mL

## 2019-10-17 MED ORDER — SODIUM CHLORIDE 0.9 % IV SOLN
INTRAVENOUS | Status: DC | PRN
  Administered 2019-10-17: 500 mL

## 2019-10-17 MED ORDER — MIDAZOLAM HCL 2 MG/2ML IJ SOLN
INTRAMUSCULAR | Status: AC
  Administered 2019-10-17: 2 mg via INTRAVENOUS
  Filled 2019-10-17: qty 2

## 2019-10-17 MED ORDER — CHLORHEXIDINE GLUCONATE 0.12 % MT SOLN
15.0000 mL | Freq: Once | OROMUCOSAL | Status: AC
  Administered 2019-10-17: 15 mL via OROMUCOSAL
  Filled 2019-10-17: qty 15

## 2019-10-17 SURGICAL SUPPLY — 33 items
AGENT HMST SPONGE THK3/8 (HEMOSTASIS)
ARMBAND PINK RESTRICT EXTREMIT (MISCELLANEOUS) ×2 IMPLANT
CANISTER SUCT 3000ML PPV (MISCELLANEOUS) ×2 IMPLANT
CLIP VESOCCLUDE MED 6/CT (CLIP) ×2 IMPLANT
CLIP VESOCCLUDE SM WIDE 6/CT (CLIP) ×2 IMPLANT
COVER PROBE W GEL 5X96 (DRAPES) ×2 IMPLANT
COVER WAND RF STERILE (DRAPES) ×2 IMPLANT
DECANTER SPIKE VIAL GLASS SM (MISCELLANEOUS) IMPLANT
DERMABOND ADVANCED (GAUZE/BANDAGES/DRESSINGS) ×1
DERMABOND ADVANCED .7 DNX12 (GAUZE/BANDAGES/DRESSINGS) ×1 IMPLANT
ELECT REM PT RETURN 9FT ADLT (ELECTROSURGICAL) ×2
ELECTRODE REM PT RTRN 9FT ADLT (ELECTROSURGICAL) ×1 IMPLANT
GLOVE BIO SURGEON STRL SZ7.5 (GLOVE) ×2 IMPLANT
GLOVE BIOGEL PI IND STRL 8 (GLOVE) ×1 IMPLANT
GLOVE BIOGEL PI INDICATOR 8 (GLOVE) ×1
GOWN STRL REUS W/ TWL LRG LVL3 (GOWN DISPOSABLE) ×2 IMPLANT
GOWN STRL REUS W/ TWL XL LVL3 (GOWN DISPOSABLE) ×2 IMPLANT
GOWN STRL REUS W/TWL LRG LVL3 (GOWN DISPOSABLE) ×4
GOWN STRL REUS W/TWL XL LVL3 (GOWN DISPOSABLE) ×4
HEMOSTAT SPONGE AVITENE ULTRA (HEMOSTASIS) IMPLANT
KIT BASIN OR (CUSTOM PROCEDURE TRAY) ×2 IMPLANT
KIT TURNOVER KIT B (KITS) ×2 IMPLANT
NS IRRIG 1000ML POUR BTL (IV SOLUTION) ×2 IMPLANT
PACK CV ACCESS (CUSTOM PROCEDURE TRAY) ×2 IMPLANT
PAD ARMBOARD 7.5X6 YLW CONV (MISCELLANEOUS) ×4 IMPLANT
SUT MNCRL AB 4-0 PS2 18 (SUTURE) ×4 IMPLANT
SUT PROLENE 6 0 BV (SUTURE) ×4 IMPLANT
SUT PROLENE 7 0 BV 1 (SUTURE) IMPLANT
SUT VIC AB 3-0 SH 27 (SUTURE) ×4
SUT VIC AB 3-0 SH 27X BRD (SUTURE) ×2 IMPLANT
TOWEL GREEN STERILE (TOWEL DISPOSABLE) ×2 IMPLANT
UNDERPAD 30X36 HEAVY ABSORB (UNDERPADS AND DIAPERS) ×2 IMPLANT
WATER STERILE IRR 1000ML POUR (IV SOLUTION) ×2 IMPLANT

## 2019-10-17 NOTE — H&P (Signed)
History and Physical Interval Note:  10/17/2019 8:34 AM  Daniel [name]  has presented today for surgery, with the diagnosis of END STAGE RENAL DISEASE.  The various methods of treatment have been discussed with the patient and family. After consideration of risks, benefits and other options for treatment, the patient has consented to  Procedure(s): FISTULA SUPERFICIALIZATION OF LEFT ARTERIOVENOUS FISTULA (Left) as a surgical intervention.  The patient's history has been reviewed, patient examined, no change in status, stable for surgery.  I have reviewed the patient's chart and labs.  Questions were answered to the patient's satisfaction.    Plan left arm AV fistula revision with sidebranch ligation and superficialization.  Unfortunately had breakfast at 5 AM and has to wait till 1 PM which he is amendable to.  Daniel [name]  POST OPERATIVE OFFICE NOTE    CC:  F/u for surgery  HPI:  This is a 59 y.o. male who is s/p right IJ 19 cm tunneled dialysis catheter placement with ultrasound guidance andcreation of left brachial artery to cephalic vein AV fistula by Dr. Donzetta Matters on 08/03/2019. No complications with catheter. No hand pain.  He dialyzes T/T/S.   No Known Allergies        Current Outpatient Medications  Medication Sig Dispense Refill  . amLODipine (NORVASC) 10 MG tablet Take 1 tablet (10 mg total) by mouth daily. 90 tablet 1  . blood glucose meter kit and supplies KIT Dispense based on patient and insurance preference. Use up to four times daily as directed. (FOR ICD-9 250.00, 250.01). 1 each 0  . chlorthalidone (HYGROTON) 50 MG tablet Take 50 mg by mouth daily.    . cloNIDine (CATAPRES) 0.2 MG tablet Take  1 tablet twice daily . If makes drowsy take 2 tablets - after he is off of work (Patient taking differently: Take 0.2 mg by mouth See admin instructions. Take 1 tablet (0.2 mg) by mouth twice daily, if it makes you drowsy take 2 tablets (0.4 mg) after work) 333  tablet 1  . folic acid (FOLVITE) 1 MG tablet Take 1 mg by mouth daily.    [name] gabapentin (NEURONTIN) 300 MG capsule Take 1 capsule (300 mg total) by mouth See admin instructions. Take 345m twice daily and 6018mat bedtime. 90 capsule 2  . hydrochlorothiazide (HYDRODIURIL) 25 MG tablet Take 25 mg by mouth daily.    . Marland KitchenYDROmorphone (DILAUDID) 2 MG tablet Take 0.5 tablets (1 mg total) by mouth every 6 (six) hours as needed for moderate pain or severe pain. 12 tablet 0  . hydrOXYzine (ATARAX/VISTARIL) 10 MG tablet Take 1 tablet (10 mg total) by mouth 3 (three) times daily as needed. 30 tablet 0  . sodium bicarbonate 325 MG tablet Take 650 mg by mouth 2 (two) times daily.    . sodium bicarbonate 650 MG tablet Take 2 tablets (1,300 mg total) by mouth 2 (two) times daily. 120 tablet 0  . ferrous sulfate 325 (65 FE) MG EC tablet Take 1 tablet (325 mg total) by mouth 2 (two) times daily. 60 tablet 1  . furosemide (LASIX) 40 MG tablet Take 1 tablet (40 mg total) by mouth daily. 30 tablet 0   No current facility-administered medications for this visit.     ROS:  See HPI    Vitals:   10/14/19 1045  BP: (!) 144/81  Pulse: 80  Resp: 20  Temp: 98.1 F (36.7 C)  SpO2: 98%   Physical Exam:  General appearance: WD, WN  in NAD Cardiac:RRR Respiratory:CTAB Incision:  Well healed Extremities:  Left upper arm fistula with palpable thrill. Good bruit Neuro: A and O times 4   Assessment/Plan:  This is a 59 y.o. male who is s/p: left B-C AVF.  His fistula diameter is adequate but the fistula is too deep to access.  Will need superficialization and ligation of competing side branches.  I explained how this is done and will be done as an outpatient as long as he did well.  We will plan this on a non-dialysis day.  Risa Grill, PA-C [facility] [phone]  Clinic MD:  Donzetta Matters

## 2019-10-17 NOTE — Progress Notes (Signed)
Pt ate Marguarite Arbour this AM at 5 AM. Dr. Tobias Alexander made aware, must wait 8 hrs. OR desk contacted Dr. Carlis Abbott, will be moved to this afternoon. Pt agreeable to plan.  Pt drove car to hospital, informed he is not allowed to drive home. Called and spoke to uncle, Tymar Polyak, [phone], who will be able to drive pt home post op.

## 2019-10-17 NOTE — Anesthesia Procedure Notes (Signed)
Anesthesia Regional Block: Supraclavicular block   Pre-Anesthetic Checklist: ,, timeout performed, Correct Patient, Correct Site, Correct Laterality, Correct Procedure, Correct Position, site marked, Risks and benefits discussed,  Surgical consent,  Pre-op evaluation,  At surgeon's request and post-op pain management  Laterality: Left  Prep: chloraprep       Needles:  Injection technique: Single-shot  Needle Type: Echogenic Stimulator Needle     Needle Length: 5cm  Needle Gauge: 22     Additional Needles:   Narrative:  Start time: 10/17/2019 1:18 PM End time: 10/17/2019 1:28 PM Injection made incrementally with aspirations every 5 mL.  Performed by: Personally  Anesthesiologist: Duane Boston, MD  Additional Notes: Functioning IV was confirmed and monitors applied.  A 65mm 22ga echogenic arrow stimulator was used. Sterile prep and drape,hand hygiene and sterile gloves were used.Ultrasound guidance: relevant anatomy identified, needle position confirmed, local anesthetic spread visualized around nerve(s)., vascular puncture avoided.  Image printed for medical record.  Negative aspiration and negative test dose prior to incremental administration of local anesthetic. The patient tolerated the procedure well.

## 2019-10-17 NOTE — Transfer of Care (Signed)
Immediate Anesthesia Transfer of Care Note  Patient: Daniel [name]  Procedure(s) Performed: LEFT UPPER EXTREMITY FISTULA REVISION, SIDE BRANCH LIGATION,  AND SUPERFICIALIZATION (Left Arm Upper)  Patient Location: PACU  Anesthesia Type:MAC  Level of Consciousness: drowsy and patient cooperative  Airway & Oxygen Therapy: Patient Spontanous Breathing and Patient connected to face mask oxygen  Post-op Assessment: Report given to RN and Post -op Vital signs reviewed and stable  Post vital signs: Reviewed and stable  Last Vitals:  Vitals Value Taken Time  BP 111/88 10/17/19 1529  Temp    Pulse 74 10/17/19 1531  Resp 28 10/17/19 1531  SpO2 100 % 10/17/19 1531  Vitals shown include unvalidated device data.  Last Pain:  Vitals:   10/17/19 0831  TempSrc: Oral  PainSc:       Patients Stated Pain Goal: 3 (21/22/48 2500)  Complications: No complications documented.

## 2019-10-17 NOTE — Discharge Instructions (Signed)
[facility]  Discharge Instructions  AV Fistula or Graft Surgery for Dialysis Access  Please refer to the following instructions for your post-procedure care. Your surgeon or physician assistant will discuss any changes with you.  Activity  You may drive the day following your surgery, if you are comfortable and no longer taking prescription pain medication. Resume full activity as the soreness in your incision resolves.  Bathing/Showering  You may shower after you go home. Keep your incision dry for 48 hours. Do not soak in a bathtub, hot tub, or swim until the incision heals completely. You may not shower if you have a hemodialysis catheter.  Incision Care  Clean your incision with mild soap and water after 48 hours. Pat the area dry with a clean towel. You do not need a bandage unless otherwise instructed. Do not apply any ointments or creams to your incision. You may have skin glue on your incision. Do not peel it off. It will come off on its own in about one week. Your arm may swell a bit after surgery. To reduce swelling use pillows to elevate your arm so it is above your heart. Your doctor will tell you if you need to lightly wrap your arm with an ACE bandage.  Diet  Resume your normal diet. There are not special food restrictions following this procedure. In order to heal from your surgery, it is CRITICAL to get adequate nutrition. Your body requires vitamins, minerals, and protein. Vegetables are the best source of vitamins and minerals. Vegetables also provide the perfect balance of protein. Processed food has little nutritional value, so try to avoid this.  Medications  Resume taking all of your medications. If your incision is causing pain, you may take over-the counter pain relievers such as acetaminophen (Tylenol). If you were prescribed a stronger pain medication, please be aware these medications can cause nausea and constipation. Prevent  nausea by taking the medication with a snack or meal. Avoid constipation by drinking plenty of fluids and eating foods with high amount of fiber, such as fruits, vegetables, and grains.  Do not take Tylenol if you are taking prescription pain medications.  Follow up Your surgeon may want to see you in the office following your access surgery. If so, this will be arranged at the time of your surgery.  Please call us immediately for any of the following conditions:  . Increased pain, redness, drainage (pus) from your incision site . Fever of 101 degrees or higher . Severe or worsening pain at your incision site . Hand pain or numbness. .  Reduce your risk of vascular disease:  . Stop smoking. If you would like help, call [facility] at 1-800-QUIT-NOW 740-367-7251) or [facility] at [phone]  . Manage your cholesterol . Maintain a desired weight . Control your diabetes . Keep your blood pressure down  Dialysis  It will take several weeks to several months for your new dialysis access to be ready for use. Your surgeon will determine when it is okay to use it. Your nephrologist will continue to direct your dialysis. You can continue to use your Permcath until your new access is ready for use.   10/17/2019 Daniel [name] [id number] 1960/10/16  Surgeon(s): Marty Heck, MD  Procedure(s): LEFT UPPER EXTREMITY FISTULA REVISION, SIDE BRANCH LIGATION,  AND SUPERFICIALIZATION  x Do not stick fistula for 6 weeks    If you have any questions, please call the office at [phone].

## 2019-10-17 NOTE — Op Note (Signed)
Date: October 17, 2019  Preoperative diagnosis: Too deep to cannulate left upper extremity brachiocephalic AV fistula  Postoperative diagnosis: Same  Procedure: Left upper extremity AV fistula revision with sidebranch ligation and superficialization  Surgeon: Dr. Marty Heck, MD  Assistant: Leontine Locket, PA  Indications: Patient is a 59 year old male who underwent a left brachiocephalic AV fistula on 3/84/66 with Dr. Donzetta Matters.  Ultimately this remains too deep to cannulate in the mid to upper arm and he presents today for planned superficialization and sidebranch ligation to further facilitate maturation.  Risk benefits have been discussed in detail.  Findings: One large side branch was identified just above the antecubitum that was ligated as well as several smaller side branches in the mid to upper portion of the fistula.  Ultimately through two skip incisions the fistula was circumferentially mobilized and elevated by closing the subcutaneous tissue with 3-0 Vicryl underneath the fistula and skin was run closed with 4-0 Monocryl just over the fistula.  Good thrill upon completion.  Anesthesia: MAC  Details: Patient was taken to the operating room after informed consent was obtained.  Placed on operative table in supine position and left arm was then prepped and draped in usual sterile fashion.  Preop timeout was performed.  Initially sterile ultrasound was used to map out the course of the brachiocephalic fistula in the left upper arm and also marked side branches that we could identify with ultrasound.  Two skip incisions were then made on the upper arm with scalpel.  Dissected down with Bovie cautery and the subcutaneous tissue was opened and then the fistula was circumferentially mobilized throughout its course through the skin bridges.  We identified several side branches that were ligated between 3-0 silk ties and divided.  Ultimately once the fistula was fully mobilized from just above  the antecubitum up toward the axilla we then closed the subcutaneous tissue with 3-0 Vicryl to elevate the fistula.  The skin was run closed with 4-0 Monocryl just over the fistula.  Dermabond was applied.  Excellent thrill upon completion.  Complication: None  Condition: Stable  Marty Heck, MD [facility] [place] Office: [place]

## 2019-10-18 ENCOUNTER — Encounter (HOSPITAL_COMMUNITY): Payer: Self-pay | Admitting: Vascular Surgery

## 2019-10-18 NOTE — Anesthesia Postprocedure Evaluation (Signed)
Anesthesia Post Note  Patient: Daniel [name]  Procedure(s) Performed: LEFT UPPER EXTREMITY FISTULA REVISION, SIDE BRANCH LIGATION,  AND SUPERFICIALIZATION (Left Arm Upper)     Patient location during evaluation: PACU Anesthesia Type: Regional Level of consciousness: awake and alert Pain management: pain level controlled Vital Signs Assessment: post-procedure vital signs reviewed and stable Respiratory status: spontaneous breathing, nonlabored ventilation, respiratory function stable and patient connected to nasal cannula oxygen Cardiovascular status: stable and blood pressure returned to baseline Postop Assessment: no apparent nausea or vomiting Anesthetic complications: no   No complications documented.  Last Vitals:  Vitals:   10/17/19 1545 10/17/19 1600  BP: 140/90 (!) 157/98  Pulse: 76 76  Resp: 17 18  Temp:  36.6 C  SpO2: 97% 97%    Last Pain:  Vitals:   10/17/19 1600  TempSrc:   PainSc: 0-No pain                 Tiajuana Amass

## 2019-10-22 ENCOUNTER — Emergency Department (HOSPITAL_COMMUNITY)
Admission: EM | Admit: 2019-10-22 | Discharge: 2019-10-23 | Disposition: A | Discharge status: Home / Self Care | Payer: Medicaid Other | Attending: Emergency Medicine | Admitting: Emergency Medicine

## 2019-10-22 ENCOUNTER — Emergency Department (HOSPITAL_COMMUNITY): Payer: Medicaid Other

## 2019-10-22 ENCOUNTER — Encounter (HOSPITAL_COMMUNITY): Payer: Self-pay | Admitting: Emergency Medicine

## 2019-10-22 ENCOUNTER — Other Ambulatory Visit: Payer: Self-pay

## 2019-10-22 ENCOUNTER — Emergency Department (HOSPITAL_COMMUNITY): Payer: Medicaid Other | Admitting: Anesthesiology

## 2019-10-22 ENCOUNTER — Encounter (HOSPITAL_COMMUNITY)
Admission: EM | Disposition: A | Discharge status: Home / Self Care | Payer: Self-pay | Source: Home / Self Care | Attending: Emergency Medicine

## 2019-10-22 DIAGNOSIS — Z20822 Contact with and (suspected) exposure to covid-19: Secondary | ICD-10-CM | POA: Insufficient documentation

## 2019-10-22 DIAGNOSIS — Z79899 Other long term (current) drug therapy: Secondary | ICD-10-CM | POA: Insufficient documentation

## 2019-10-22 DIAGNOSIS — I120 Hypertensive chronic kidney disease with stage 5 chronic kidney disease or end stage renal disease: Secondary | ICD-10-CM | POA: Insufficient documentation

## 2019-10-22 DIAGNOSIS — F17210 Nicotine dependence, cigarettes, uncomplicated: Secondary | ICD-10-CM | POA: Insufficient documentation

## 2019-10-22 DIAGNOSIS — Z7984 Long term (current) use of oral hypoglycemic drugs: Secondary | ICD-10-CM | POA: Diagnosis not present

## 2019-10-22 DIAGNOSIS — T8242XA Displacement of vascular dialysis catheter, initial encounter: Secondary | ICD-10-CM | POA: Diagnosis not present

## 2019-10-22 DIAGNOSIS — Z992 Dependence on renal dialysis: Secondary | ICD-10-CM | POA: Insufficient documentation

## 2019-10-22 DIAGNOSIS — E1122 Type 2 diabetes mellitus with diabetic chronic kidney disease: Secondary | ICD-10-CM | POA: Insufficient documentation

## 2019-10-22 DIAGNOSIS — Z419 Encounter for procedure for purposes other than remedying health state, unspecified: Secondary | ICD-10-CM

## 2019-10-22 DIAGNOSIS — N186 End stage renal disease: Secondary | ICD-10-CM | POA: Insufficient documentation

## 2019-10-22 DIAGNOSIS — T829XXA Unspecified complication of cardiac and vascular prosthetic device, implant and graft, initial encounter: Secondary | ICD-10-CM

## 2019-10-22 HISTORY — PX: INSERTION OF DIALYSIS CATHETER: SHX1324

## 2019-10-22 LAB — RENAL FUNCTION PANEL
Albumin: 2.4 g/dL — ABNORMAL LOW (ref 3.5–5.0)
Anion gap: 17 — ABNORMAL HIGH (ref 5–15)
BUN: 66 mg/dL — ABNORMAL HIGH (ref 6–20)
CO2: 23 mmol/L (ref 22–32)
Calcium: 8.6 mg/dL — ABNORMAL LOW (ref 8.9–10.3)
Chloride: 95 mmol/L — ABNORMAL LOW (ref 98–111)
Creatinine, Ser: 8.56 mg/dL — ABNORMAL HIGH (ref 0.61–1.24)
GFR calc Af Amer: 7 mL/min — ABNORMAL LOW (ref >60)
GFR calc non Af Amer: 6 mL/min — ABNORMAL LOW (ref >60)
Glucose, Bld: 107 mg/dL — ABNORMAL HIGH (ref 70–99)
Phosphorus: 6.6 mg/dL — ABNORMAL HIGH (ref 2.5–4.6)
Potassium: 3.7 mmol/L (ref 3.5–5.1)
Sodium: 135 mmol/L (ref 135–145)

## 2019-10-22 LAB — CBC
HCT: 24.9 % — ABNORMAL LOW (ref 39.0–52.0)
Hemoglobin: 8 g/dL — ABNORMAL LOW (ref 13.0–17.0)
MCH: 34.8 pg — ABNORMAL HIGH (ref 26.0–34.0)
MCHC: 32.1 g/dL (ref 30.0–36.0)
MCV: 108.3 fL — ABNORMAL HIGH (ref 80.0–100.0)
Platelets: 136 10*3/uL — ABNORMAL LOW (ref 150–400)
RBC: 2.3 MIL/uL — ABNORMAL LOW (ref 4.22–5.81)
RDW: 13.3 % (ref 11.5–15.5)
WBC: 6.1 10*3/uL (ref 4.0–10.5)
nRBC: 0.3 % — ABNORMAL HIGH (ref 0.0–0.2)

## 2019-10-22 LAB — SARS CORONAVIRUS 2 BY RT PCR (HOSPITAL ORDER, PERFORMED IN ~~LOC~~ HOSPITAL LAB): SARS Coronavirus 2: NEGATIVE

## 2019-10-22 LAB — I-STAT CHEM 8, ED
BUN: 63 mg/dL — ABNORMAL HIGH (ref 6–20)
Calcium, Ion: 1.1 mmol/L — ABNORMAL LOW (ref 1.15–1.40)
Chloride: 98 mmol/L (ref 98–111)
Creatinine, Ser: 9.7 mg/dL — ABNORMAL HIGH (ref 0.61–1.24)
Glucose, Bld: 119 mg/dL — ABNORMAL HIGH (ref 70–99)
HCT: 24 % — ABNORMAL LOW (ref 39.0–52.0)
Hemoglobin: 8.2 g/dL — ABNORMAL LOW (ref 13.0–17.0)
Potassium: 3.8 mmol/L (ref 3.5–5.1)
Sodium: 134 mmol/L — ABNORMAL LOW (ref 135–145)
TCO2: 28 mmol/L (ref 22–32)

## 2019-10-22 LAB — GLUCOSE, CAPILLARY: Glucose-Capillary: 87 mg/dL (ref 70–99)

## 2019-10-22 LAB — CBG MONITORING, ED: Glucose-Capillary: 87 mg/dL (ref 70–99)

## 2019-10-22 SURGERY — INSERTION OF DIALYSIS CATHETER
Anesthesia: Monitor Anesthesia Care | Laterality: Right

## 2019-10-22 MED ORDER — FENTANYL CITRATE (PF) 100 MCG/2ML IJ SOLN
INTRAMUSCULAR | Status: AC
  Filled 2019-10-22: qty 2

## 2019-10-22 MED ORDER — HEPARIN SODIUM (PORCINE) 1000 UNIT/ML DIALYSIS
1000.0000 [IU] | INTRAMUSCULAR | Status: DC | PRN
  Filled 2019-10-22: qty 1

## 2019-10-22 MED ORDER — SODIUM CHLORIDE 0.9 % IV SOLN
INTRAVENOUS | Status: AC
  Filled 2019-10-22: qty 1.2

## 2019-10-22 MED ORDER — SODIUM CHLORIDE 0.9 % IV SOLN: 100.0000 mL | INTRAVENOUS | Status: DC | PRN

## 2019-10-22 MED ORDER — LIDOCAINE 2% (20 MG/ML) 5 ML SYRINGE
INTRAMUSCULAR | Status: AC
  Filled 2019-10-22: qty 5

## 2019-10-22 MED ORDER — LIDOCAINE HCL (PF) 1 % IJ SOLN: 5.0000 mL | INTRAMUSCULAR | Status: DC | PRN

## 2019-10-22 MED ORDER — DIPHENHYDRAMINE HCL 50 MG/ML IJ SOLN
12.5000 mg | Freq: Once | INTRAMUSCULAR | Status: AC
  Administered 2019-10-22: 12.5 mg via INTRAVENOUS

## 2019-10-22 MED ORDER — ACETAMINOPHEN 500 MG PO TABS
ORAL_TABLET | ORAL | Status: AC
  Administered 2019-10-22: 1000 mg via ORAL
  Filled 2019-10-22: qty 2

## 2019-10-22 MED ORDER — PROPOFOL 500 MG/50ML IV EMUL
INTRAVENOUS | Status: DC | PRN
  Administered 2019-10-22: 100 ug/kg/min via INTRAVENOUS

## 2019-10-22 MED ORDER — FENTANYL CITRATE (PF) 250 MCG/5ML IJ SOLN
INTRAMUSCULAR | Status: DC | PRN
  Administered 2019-10-22: 50 ug via INTRAVENOUS

## 2019-10-22 MED ORDER — OXYCODONE HCL 5 MG PO TABS: 5.0000 mg | ORAL_TABLET | ORAL | 0 refills | Status: DC | PRN

## 2019-10-22 MED ORDER — ACETAMINOPHEN 500 MG PO TABS: 1000.0000 mg | ORAL_TABLET | Freq: Once | ORAL | Status: AC

## 2019-10-22 MED ORDER — ALTEPLASE 2 MG IJ SOLR: 2.0000 mg | Freq: Once | INTRAMUSCULAR | Status: DC | PRN

## 2019-10-22 MED ORDER — FENTANYL CITRATE (PF) 100 MCG/2ML IJ SOLN
50.0000 ug | Freq: Once | INTRAMUSCULAR | Status: AC
  Administered 2019-10-22: 50 ug via INTRAVENOUS
  Filled 2019-10-22: qty 2

## 2019-10-22 MED ORDER — CHLORHEXIDINE GLUCONATE CLOTH 2 % EX PADS: 6.0000 | MEDICATED_PAD | Freq: Every day | CUTANEOUS | Status: DC

## 2019-10-22 MED ORDER — SODIUM CHLORIDE 0.9 % IV SOLN: INTRAVENOUS | Status: DC

## 2019-10-22 MED ORDER — ALBUTEROL SULFATE HFA 108 (90 BASE) MCG/ACT IN AERS
INHALATION_SPRAY | RESPIRATORY_TRACT | Status: DC | PRN
  Administered 2019-10-22: 2 via RESPIRATORY_TRACT

## 2019-10-22 MED ORDER — CHLORHEXIDINE GLUCONATE 0.12 % MT SOLN
OROMUCOSAL | Status: AC
  Administered 2019-10-22: 15 mL via OROMUCOSAL
  Filled 2019-10-22: qty 15

## 2019-10-22 MED ORDER — 0.9 % SODIUM CHLORIDE (POUR BTL) OPTIME
TOPICAL | Status: DC | PRN
  Administered 2019-10-22: 1000 mL

## 2019-10-22 MED ORDER — LIDOCAINE-PRILOCAINE 2.5-2.5 % EX CREA
1.0000 | TOPICAL_CREAM | CUTANEOUS | Status: DC | PRN
  Filled 2019-10-22: qty 5

## 2019-10-22 MED ORDER — LIDOCAINE-EPINEPHRINE (PF) 1 %-1:200000 IJ SOLN
INTRAMUSCULAR | Status: AC
  Filled 2019-10-22: qty 30

## 2019-10-22 MED ORDER — DIPHENHYDRAMINE HCL 50 MG/ML IJ SOLN
INTRAMUSCULAR | Status: AC
  Filled 2019-10-22: qty 1

## 2019-10-22 MED ORDER — HEPARIN SODIUM (PORCINE) 1000 UNIT/ML IJ SOLN
INTRAMUSCULAR | Status: AC
  Filled 2019-10-22: qty 1

## 2019-10-22 MED ORDER — MIDAZOLAM HCL 2 MG/2ML IJ SOLN
INTRAMUSCULAR | Status: AC
  Filled 2019-10-22: qty 2

## 2019-10-22 MED ORDER — SODIUM CHLORIDE 0.9 % IV SOLN
INTRAVENOUS | Status: DC | PRN
  Administered 2019-10-22: 500 mL

## 2019-10-22 MED ORDER — PROPOFOL 10 MG/ML IV BOLUS
INTRAVENOUS | Status: DC | PRN
  Administered 2019-10-22 (×2): 20 mg via INTRAVENOUS

## 2019-10-22 MED ORDER — LIDOCAINE-EPINEPHRINE (PF) 1 %-1:200000 IJ SOLN
INTRAMUSCULAR | Status: DC | PRN
  Administered 2019-10-22: 10 mL

## 2019-10-22 MED ORDER — HEPARIN SODIUM (PORCINE) 1000 UNIT/ML IJ SOLN
INTRAMUSCULAR | Status: DC | PRN
  Administered 2019-10-22: 3800 [IU]

## 2019-10-22 MED ORDER — CEFAZOLIN SODIUM-DEXTROSE 1-4 GM/50ML-% IV SOLN
1.0000 g | INTRAVENOUS | Status: DC
  Filled 2019-10-22: qty 50

## 2019-10-22 MED ORDER — ONDANSETRON HCL 4 MG/2ML IJ SOLN
INTRAMUSCULAR | Status: DC | PRN
  Administered 2019-10-22: 4 mg via INTRAVENOUS

## 2019-10-22 MED ORDER — FENTANYL CITRATE (PF) 100 MCG/2ML IJ SOLN
25.0000 ug | INTRAMUSCULAR | Status: DC | PRN
  Administered 2019-10-22 (×2): 50 ug via INTRAVENOUS

## 2019-10-22 MED ORDER — MIDAZOLAM HCL 2 MG/2ML IJ SOLN
INTRAMUSCULAR | Status: DC | PRN
  Administered 2019-10-22: 1 mg via INTRAVENOUS

## 2019-10-22 MED ORDER — LIDOCAINE HCL 1 % IJ SOLN
INTRAMUSCULAR | Status: AC
  Filled 2019-10-22: qty 20

## 2019-10-22 MED ORDER — CEFAZOLIN SODIUM-DEXTROSE 2-3 GM-%(50ML) IV SOLR
INTRAVENOUS | Status: DC | PRN
Start: 2019-10-22 — End: 2019-10-22
  Administered 2019-10-22: 2 g via INTRAVENOUS

## 2019-10-22 MED ORDER — LIDOCAINE 2% (20 MG/ML) 5 ML SYRINGE
INTRAMUSCULAR | Status: DC | PRN
  Administered 2019-10-22: 40 mg via INTRAVENOUS

## 2019-10-22 MED ORDER — CHLORHEXIDINE GLUCONATE 0.12 % MT SOLN: 15.0000 mL | Freq: Once | OROMUCOSAL | Status: AC

## 2019-10-22 MED ORDER — PENTAFLUOROPROP-TETRAFLUOROETH EX AERO
1.0000 "application " | INHALATION_SPRAY | CUTANEOUS | Status: DC | PRN
  Filled 2019-10-22: qty 116

## 2019-10-22 MED ORDER — FENTANYL CITRATE (PF) 250 MCG/5ML IJ SOLN
INTRAMUSCULAR | Status: AC
  Filled 2019-10-22: qty 5

## 2019-10-22 SURGICAL SUPPLY — 47 items
ADH SKN CLS APL DERMABOND .7 (GAUZE/BANDAGES/DRESSINGS) ×1
APL PRP STRL LF DISP 70% ISPRP (MISCELLANEOUS) ×1
BAG DECANTER FOR FLEXI CONT (MISCELLANEOUS) ×2 IMPLANT
BIOPATCH BLUE 3/4IN DISK W/1.5 (GAUZE/BANDAGES/DRESSINGS) ×2 IMPLANT
BIOPATCH RED 1 DISK 7.0 (GAUZE/BANDAGES/DRESSINGS) ×2 IMPLANT
CATH PALINDROME-P 19CM W/VT (CATHETERS) IMPLANT
CATH PALINDROME-P 23CM W/VT (CATHETERS) ×2 IMPLANT
CATH PALINDROME-P 28CM W/VT (CATHETERS) IMPLANT
CHLORAPREP W/TINT 26 (MISCELLANEOUS) ×2 IMPLANT
COVER PROBE W GEL 5X96 (DRAPES) ×2 IMPLANT
COVER SURGICAL LIGHT HANDLE (MISCELLANEOUS) ×2 IMPLANT
COVER WAND RF STERILE (DRAPES) IMPLANT
DERMABOND ADVANCED (GAUZE/BANDAGES/DRESSINGS) ×1
DERMABOND ADVANCED .7 DNX12 (GAUZE/BANDAGES/DRESSINGS) ×1 IMPLANT
DRAPE C-ARM 42X72 X-RAY (DRAPES) ×2 IMPLANT
DRAPE CHEST BREAST 15X10 FENES (DRAPES) ×2 IMPLANT
GAUZE 4X4 16PLY RFD (DISPOSABLE) ×2 IMPLANT
GAUZE SPONGE 4X4 12PLY STRL LF (GAUZE/BANDAGES/DRESSINGS) ×2 IMPLANT
GLOVE BIO SURGEON STRL SZ7.5 (GLOVE) ×2 IMPLANT
GLOVE BIOGEL PI IND STRL 7.0 (GLOVE) ×1 IMPLANT
GLOVE BIOGEL PI IND STRL 7.5 (GLOVE) ×1 IMPLANT
GLOVE BIOGEL PI IND STRL 8 (GLOVE) ×1 IMPLANT
GLOVE BIOGEL PI INDICATOR 7.0 (GLOVE) ×1
GLOVE BIOGEL PI INDICATOR 7.5 (GLOVE) ×1
GLOVE BIOGEL PI INDICATOR 8 (GLOVE) ×1
GLOVE SURG SS PI 7.5 STRL IVOR (GLOVE) ×2 IMPLANT
GOWN STRL REUS W/ TWL LRG LVL3 (GOWN DISPOSABLE) ×2 IMPLANT
GOWN STRL REUS W/TWL LRG LVL3 (GOWN DISPOSABLE) ×4
KIT BASIN OR (CUSTOM PROCEDURE TRAY) ×2 IMPLANT
KIT PALINDROME-P 55CM (CATHETERS) IMPLANT
KIT TURNOVER KIT B (KITS) ×2 IMPLANT
NEEDLE 18GX1X1/2 (RX/OR ONLY) (NEEDLE) ×2 IMPLANT
NEEDLE HYPO 25GX1X1/2 BEV (NEEDLE) ×2 IMPLANT
NS IRRIG 1000ML POUR BTL (IV SOLUTION) ×2 IMPLANT
PACK SURGICAL SETUP 50X90 (CUSTOM PROCEDURE TRAY) ×2 IMPLANT
PAD ARMBOARD 7.5X6 YLW CONV (MISCELLANEOUS) ×4 IMPLANT
SET MICROPUNCTURE 5F STIFF (MISCELLANEOUS) ×2 IMPLANT
SUT ETHILON 3 0 PS 1 (SUTURE) ×2 IMPLANT
SUT VICRYL 4-0 PS2 18IN ABS (SUTURE) ×2 IMPLANT
SYR 10ML LL (SYRINGE) ×2 IMPLANT
SYR 20ML LL LF (SYRINGE) ×4 IMPLANT
SYR 5ML LL (SYRINGE) ×4 IMPLANT
SYR CONTROL 10ML LL (SYRINGE) ×2 IMPLANT
TAPE CLOTH SURG 4X10 WHT LF (GAUZE/BANDAGES/DRESSINGS) ×2 IMPLANT
TOWEL GREEN STERILE (TOWEL DISPOSABLE) ×4 IMPLANT
TOWEL GREEN STERILE FF (TOWEL DISPOSABLE) ×2 IMPLANT
WATER STERILE IRR 1000ML POUR (IV SOLUTION) ×2 IMPLANT

## 2019-10-22 NOTE — ED Provider Notes (Addendum)
[facility] EMERGENCY DEPARTMENT Provider Note   CSN: [id number] Arrival date & time: 10/22/19  4196     History Chief Complaint  Patient presents with  . Vascular Access Problem    Daniel [name] is a 59 y.o. male.  HPI    59 yo male esrd on dialysis t, th, Saturday last dialysed presents today with dialysis catheter falling out.  Patient states he was dialyzed on Thursday.  He reports last every went to bed he felt that some of the catheter was out farther than it had been.  He went into a store when he came out he saw the catheter laying on the ground outside the store.  He does not report any trauma to the chest wall.  He is due for dialysis today.  He had a left upper extremity AV fistula revision performed on June 21.  He states his tender at the site.  He denies fever or redness. Past Medical History:  Diagnosis Date  . Diabetes mellitus without complication ([place])   . Hypertension   . ICH (intracerebral hemorrhage) ([place]) 05/20/2017  . Shoulder pain, left 06/28/2013    Patient Active Problem List   Diagnosis Date Noted  . ESRD (end stage renal disease) ([place])   . AVM (arteriovenous malformation) of small bowel, acquired with hemorrhage   . Symptomatic anemia 07/31/2019  . Acute on chronic anemia 07/23/2019  . Chronic kidney disease, stage V ([place])   . Chronic hepatitis C without hepatic coma ([place]) 07/11/2019  . Iron deficiency anemia 03/30/2019  . CKD (chronic kidney disease), stage V ([place]) 03/30/2019  . Alcohol use disorder 03/30/2019  . B12 deficiency 03/30/2019  . Orthostatic hypotension 01/22/2019  . Anemia   . Hypertension 06/29/2016  . DM2 (diabetes mellitus, type 2) ([place]) 06/29/2016    Past Surgical History:  Procedure Laterality Date  . APPENDECTOMY    . AV FISTULA PLACEMENT Left 08/03/2019   Procedure: LEFT ARM ARTERIOVENOUS (AV) CEPHALIC  FISTULA CREATION;  Surgeon: Waynetta Sandy, MD;  Location: [place];  Service: Vascular;   Laterality: Left;  . BIOPSY  06/30/2019   Procedure: BIOPSY;  Surgeon: Ronald Lobo, MD;  Location: [place];  Service: Endoscopy;;  . BIOPSY  08/02/2019   Procedure: BIOPSY;  Surgeon: Yetta Flock, MD;  Location: [place];  Service: Gastroenterology;;  . COLONOSCOPY  01/23/2012   Procedure: COLONOSCOPY;  Surgeon: Danie Binder, MD;  [facility];  Service: Endoscopy;  Laterality: N/A;  11:10 AM  . COLONOSCOPY WITH PROPOFOL N/A 06/30/2019   Procedure: COLONOSCOPY WITH PROPOFOL;  Surgeon: Ronald Lobo, MD;  Location: [place];  Service: Endoscopy;  Laterality: N/A;  . ENTEROSCOPY N/A 08/02/2019   Procedure: ENTEROSCOPY;  Surgeon: Yetta Flock, MD;  Location: [facility] ENDOSCOPY;  Service: Gastroenterology;  Laterality: N/A;  . ESOPHAGOGASTRODUODENOSCOPY (EGD) WITH PROPOFOL N/A 06/30/2019   Procedure: ESOPHAGOGASTRODUODENOSCOPY (EGD) WITH PROPOFOL;  Surgeon: Ronald Lobo, MD;  Location: [place];  Service: Endoscopy;  Laterality: N/A;  . FISTULA SUPERFICIALIZATION Left 10/17/2019   Procedure: LEFT UPPER EXTREMITY FISTULA REVISION, SIDE BRANCH LIGATION,  AND SUPERFICIALIZATION;  Surgeon: Marty Heck, MD;  Location: [place];  Service: Vascular;  Laterality: Left;  . GIVENS CAPSULE STUDY N/A 06/30/2019   Procedure: GIVENS CAPSULE STUDY;  Surgeon: Ronald Lobo, MD;  Location: [facility] ENDOSCOPY;  Service: Endoscopy;  Laterality: N/A;  . HEMOSTASIS CONTROL  08/02/2019   Procedure: HEMOSTASIS CONTROL;  Surgeon: Yetta Flock, MD;  Location: [place] ENDOSCOPY;  Service: Gastroenterology;;  . Lannette Donath AND DRAINAGE ABSCESS N/A 06/29/2016   Procedure: INCISION AND DRAINAGE ABDOMINAL WALL ABSCESS;  Surgeon: Alphonsa Overall, MD;  Location: [facility];  Service: General;  Laterality: N/A;  . INSERTION OF DIALYSIS CATHETER Right 08/03/2019   Procedure: INSERTION OF DIALYSIS CATHETER;  Surgeon: Waynetta Sandy, MD;  Location: [place];  Service: Vascular;  Laterality: Right;  .  Left heel surgery         Family History  Problem Relation Age of Onset  . Colon cancer Neg Hx     Social History   Tobacco Use  . Smoking status: Current Every Day Smoker    Packs/day: 1.00    Years: 30.00    Pack years: 30.00    Types: Cigarettes  . Smokeless tobacco: Never Used  Substance Use Topics  . Alcohol use: Yes    Alcohol/week: 12.0 standard drinks    Types: 12 Cans of beer per week  . Drug use: Yes    Types: "Crack" cocaine    Comment: last in 2020    Home Medications Prior to Admission medications   Medication Sig Start Date End Date Taking? Authorizing Provider  amLODipine (NORVASC) 10 MG tablet Take 1 tablet (10 mg total) by mouth daily. 07/13/19 07/12/20  [name], NP  blood glucose meter kit and supplies KIT Dispense based on patient and insurance preference. Use up to four times daily as directed. (FOR ICD-9 250.00, 250.01). 07/01/16   Nita Sells, MD  chlorthalidone (HYGROTON) 50 MG tablet Take 50 mg by mouth daily. 08/11/19   [provider]  cloNIDine (CATAPRES) 0.2 MG tablet Take  1 tablet twice daily . If makes drowsy take 2 tablets - after he is off of work Patient taking differently: Take 0.2 mg by mouth See admin instructions. Take 1 tablet (0.2 mg) by mouth twice daily, if it makes you drowsy take 2 tablets (0.4 mg) after work 07/13/19   [name], NP  ferrous sulfate 325 (65 FE) MG EC tablet Take 1 tablet (325 mg total) by mouth 2 (two) times daily. 07/13/19 10/17/19  [name], NP  folic acid (FOLVITE) 1 MG tablet Take 1 mg by mouth daily. 08/11/19   [provider]  furosemide (LASIX) 40 MG tablet Take 1 tablet (40 mg total) by mouth daily. 07/25/19 10/17/19  Ladona Horns, MD  gabapentin (NEURONTIN) 300 MG capsule Take 1 capsule (300 mg total) by mouth See admin instructions. Take 311m twice daily and 6049mat bedtime. 08/16/19   EdKerin PernaNP  hydrochlorothiazide (HYDRODIURIL) 25 MG tablet  Take 25 mg by mouth daily.    [provider]  hydrOXYzine (ATARAX/VISTARIL) 10 MG tablet Take 1 tablet (10 mg total) by mouth 3 (three) times daily as needed. Patient taking differently: Take 10 mg by mouth 3 (three) times daily as needed for itching or anxiety.  08/16/19   EdKerin PernaNP  oxyCODONE-acetaminophen (PERCOCET) 5-325 MG tablet Take 1 tablet by mouth every 6 (six) hours as needed for severe pain. 10/17/19   RhGabriel EaringPA-C    Allergies    Patient has no known allergies.  Review of Systems   Review of Systems  Physical Exam Updated Vital Signs BP (!) 152/79 (BP Location: Right Arm)   Pulse 96   Temp 98.4 F (36.9 C) (Oral)   Resp 16   SpO2 100%   Physical Exam  ED Results / Procedures / Treatments   Labs (  all labs ordered are listed, but only abnormal results are displayed) Labs Reviewed  I-STAT CHEM 8, ED    EKG None  Radiology No results found.  Procedures Procedures (including critical care time)  Medications Ordered in ED Medications - No data to display  ED Course  I have reviewed the triage vital signs and the nursing notes.  Pertinent labs & imaging results that were available during my care of the patient were reviewed by me and considered in my medical decision making (see chart for details).    MDM Rules/Calculators/A&P                          Discussed with Dr. Scot Dock and he will be in to evaluate Dr. Scot Dock has been in to evaluate patient.  Plan is to take the patient to the operating room.  Patient reports last p.o. 6 AM. Will consult nephrology Cussed with Dr. Hollie Salk and she is aware that patient will need dialysis after access has been obtained Final Clinical Impression(s) / ED Diagnoses Final diagnoses:  ESRD (end stage renal disease) ([place])  Complication of vascular dialysis catheter, unspecified complication, initial encounter    Rx / DC Orders ED Discharge Orders    None       Pattricia Boss,  MD 10/22/19 5520    Pattricia Boss, MD 10/22/19 1003

## 2019-10-22 NOTE — Op Note (Signed)
° ° °  NAME: Daniel [name]    MRN: [id number] DOB: 1960-09-24    DATE OF OPERATION: 10/22/2019  PREOP DIAGNOSIS:    End-stage renal disease  POSTOP DIAGNOSIS:    Same  PROCEDURE:    Ultrasound-guided placement of right IJ 23 cm tunneled dialysis catheter  SURGEON: Judeth Cornfield. Scot Dock, MD  ASSIST: None  ANESTHESIA: Local with sedation  EBL: Minimal  INDICATIONS:    Daniel [name] is a 59 y.o. male who had a right IJ tunneled dialysis catheter which "fell out."  He needs dialysis and we were asked to place a new catheter  FINDINGS:   Patent right IJ  TECHNIQUE:   The patient was taken to the operating room and sedated by anesthesia.  The neck and upper chest were prepped and draped in usual sterile fashion.  By duplex the IJ's were both patent.  I selected a right-sided approach.  The neck and upper chest had been prepped and draped, under ultrasound guidance, after the skin was anesthetized, I cannulated the right IJ with a micropuncture needle and a micropuncture sheath was introduced over a wire.  I then advanced the wire into the right atrium.  I selected the exit site for the catheter and the skin anesthetized between the 2 areas.  A small incision was made at the exit site in the catheter tunneled to the IJ cannulation site.  Next the track over the wire was dilated and then the dilator and sheath were passed over the wire and the dilator and wire were removed.  The catheter was passed through the peel-away sheath and positioned in the right atrium.  Catheter was secured at its exit site with 3-0 nylon suture.  The IJ cannulation site was closed with a 4-0 subcuticular stitch.  Sterile dressing was applied.  Both ports withdrew easily with and flushed with heparinized saline and filled with concentrated heparin.   Deitra Mayo, MD, FACS [facility]  DATE OF DICTATION:   10/22/2019

## 2019-10-22 NOTE — Progress Notes (Signed)
  [facility] Progress Note   Assessment/ Plan:   1. Inadvertent TDC Removal- for [facility] replacement with [facility] today.  Appreciate Dr Scot Dock 2.ESRD TTS AF.  Will do HD today 3. Anemia: ESA just given 4. CKD-MBD: binders, hectorol with rx 5. Nutrition: NPO for now 6. Hypertension: UF as tolerated 7.  Dispo: OK to go from renal perspective after HD, don't forsee he will need admission  Subjective:    Brief note: TTS ESRD pt who had his HD catheter fell out this AM.  Came to ED today.  Plan for [facility] placement with VVS and then dialysis thereafter.  He dialyzes at [facility].  Had AVF superficialization and side branch ligation 6/21, reports L arm soreness.  CXR and AXR without retained foreign body  Dialysis orders: TTS 4 hrs EDW 114 kg 2K 2.25 Ca bath No UF profile Hectorol 4 mg q rx No heparin mircera 75 q 4 weeks, last given 6/24.     Objective:   BP (!) 168/103   Pulse 76   Temp 98 F (36.7 C) (Oral)   Resp 16   SpO2 98%   Physical Exam: Gen: NAD sitting on hospital bed CVS: RRR Resp: clear Abd: soft Ext: trace LE edema ACCESS: R IJ TDC catheter tract without bleeding.  Some scabbing at site.  No tenderness in tract or purulence expressed.  LUE AVF + T/B, some soreness, slight ecchymoses/ warmth.    Labs: BMET Recent Labs  Lab 10/17/19 0927 10/22/19 0841 10/22/19 1145  NA 139 134* 135  K 3.7 3.8 3.7  CL 100 98 95*  CO2  --   --  23  GLUCOSE 102* 119* 107*  BUN 42* 63* 66*  CREATININE 10.00* 9.70* 8.56*  CALCIUM  --   --  8.6*  PHOS  --   --  6.6*   CBC Recent Labs  Lab 10/17/19 0927 10/22/19 0841 10/22/19 1145  WBC  --   --  6.1  HGB 8.8* 8.2* 8.0*  HCT 26.0* 24.0* 24.9*  MCV  --   --  108.3*  PLT  --   --  136*      Medications:    . acetaminophen      . acetaminophen  1,000 mg Oral Once  . chlorhexidine  15 mL Mouth/Throat Once  . chlorhexidine      . [MAR Hold] Chlorhexidine Gluconate Cloth  6 each Topical Q0600     Madelon Lips MD 10/22/2019, 1:55 PM

## 2019-10-22 NOTE — ED Triage Notes (Addendum)
Pt states he walked in a store this morning and came back out and saw hemodialysis catheter from R chest lying on the ground.  States he didn't realize it came out.  Bandage still in place that was already on site.  Reports SOB since then.  Also c/o L upper arm pain at site of dialysis graft that was placed approx 1 week ago.

## 2019-10-22 NOTE — OR Nursing (Signed)
Patient has left upper arm fistula;  positive for thrill and pulse pre and post-op.

## 2019-10-22 NOTE — ED Notes (Signed)
MD was called to inform him that pt is back in the room.

## 2019-10-22 NOTE — ED Notes (Signed)
Pt is going to xray.

## 2019-10-22 NOTE — Anesthesia Postprocedure Evaluation (Signed)
Anesthesia Post Note  Patient: Daniel [name]  Procedure(s) Performed: INSERTION OF 23CM TUNNELED DIALYSIS CATHETER RIGHT INTERNAL JUGULAR (Right )     Patient location during evaluation: PACU Anesthesia Type: MAC Level of consciousness: awake and alert Pain management: pain level controlled Vital Signs Assessment: post-procedure vital signs reviewed and stable Respiratory status: spontaneous breathing, nonlabored ventilation and respiratory function stable Cardiovascular status: stable and blood pressure returned to baseline Postop Assessment: no apparent nausea or vomiting Anesthetic complications: no   No complications documented.  Last Vitals:  Vitals:   10/22/19 1525 10/22/19 1540  BP: (!) 157/93 (!) 155/92  Pulse: 80 84  Resp: 20 20  Temp:    SpO2: 96% 96%    Last Pain:  Vitals:   10/22/19 1540  TempSrc:   PainSc: 0-No pain                 Rexine Gowens,W. EDMOND

## 2019-10-22 NOTE — Consult Note (Signed)
REASON FOR CONSULT:    Hemodialysis catheter came out.  The consult is requested by Dr. Jeanell Sparrow.  ASSESSMENT & PLAN:   END-STAGE RENAL DISEASE: His right IJ tunneled dialysis catheter came out.  He will need a new catheter.  He had cereal and a chicken wing at 6 AM and then a slurp he and doughnuts at 8 AM.  I have spoken with Dr. Ola Spurr.  He will need to be n.p.o. for 8 hours since he had the chicken at 6 AM.  I have reviewed the indications for placement of the catheter and the potential complications and he is agreeable to proceed.  We will proceed at 2 PM if an OR room is available.   Deitra Mayo, MD Office: [phone]   HPI:   Daniel [name] is a pleasant 59 y.o. male, who had Superficialization of a right upper arm fistula last week.  He had a previous catheter in place.  The patient states that the catheter simply came out on its own.  He dialyzes on Tuesdays Thursdays and Saturdays.  For this reason he was unable to get dialysis today.  Vascular surgery is consulted for placement of a new catheter.   Past Medical History:  Diagnosis Date  . Diabetes mellitus without complication ([place])   . Hypertension   . ICH (intracerebral hemorrhage) ([place]) 05/20/2017  . Shoulder pain, left 06/28/2013    Family History  Problem Relation Age of Onset  . Colon cancer Neg Hx     SOCIAL HISTORY: Social History   Socioeconomic History  . Marital status: Single    Spouse name: Not on file  . Number of children: Not on file  . Years of education: Not on file  . Highest education level: Not on file  Occupational History  . Not on file  Tobacco Use  . Smoking status: Current Every Day Smoker    Packs/day: 1.00    Years: 30.00    Pack years: 30.00    Types: Cigarettes  . Smokeless tobacco: Never Used  Substance and Sexual Activity  . Alcohol use: Yes    Alcohol/week: 12.0 standard drinks    Types: 12 Cans of beer per week  . Drug use: Yes    Types: "Crack" cocaine     Comment: last in 2020  . Sexual activity: Yes    Birth control/protection: None  Other Topics Concern  . Not on file  Social History Narrative  . Not on file   Social Determinants of Health   Financial Resource Strain:   . Difficulty of Paying Living Expenses:   Food Insecurity:   . Worried About Charity fundraiser in the Last Year:   . Arboriculturist in the Last Year:   Transportation Needs:   . Film/video editor (Medical):   [name] Lack of Transportation (Non-Medical):   Physical Activity:   . Days of Exercise per Week:   . Minutes of Exercise per Session:   Stress:   . Feeling of Stress :   Social Connections:   . Frequency of Communication with Friends and Family:   . Frequency of Social Gatherings with Friends and Family:   . Attends Religious Services:   . Active Member of Clubs or Organizations:   . Attends Archivist Meetings:   [name] Marital Status:   Intimate Partner Violence:   . Fear of Current or Ex-Partner:   . Emotionally Abused:   [name] Physically Abused:   .  Sexually Abused:     No Known Allergies  Current Facility-Administered Medications  Medication Dose Route Frequency Provider Last Rate Last Admin  . [START ON 10/23/2019] ceFAZolin (ANCEF) IVPB 1 g/50 mL premix  1 g Intravenous On Call Angelia Mould, MD       Current Outpatient Medications  Medication Sig Dispense Refill  . amLODipine (NORVASC) 10 MG tablet Take 1 tablet (10 mg total) by mouth daily. 90 tablet 1  . cloNIDine (CATAPRES) 0.2 MG tablet Take  1 tablet twice daily . If makes drowsy take 2 tablets - after he is off of work (Patient taking differently: Take 0.2 mg by mouth See admin instructions. Take 1 tablet (0.2 mg) by mouth twice daily, if it makes you drowsy take 2 tablets (0.4 mg) after work) 180 tablet 1  . gabapentin (NEURONTIN) 300 MG capsule Take 1 capsule (300 mg total) by mouth See admin instructions. Take 322m twice daily and 6033mat bedtime. 90 capsule 2  .  hydrochlorothiazide (HYDRODIURIL) 25 MG tablet Take 25 mg by mouth daily.    . hydrOXYzine (ATARAX/VISTARIL) 10 MG tablet Take 1 tablet (10 mg total) by mouth 3 (three) times daily as needed. (Patient taking differently: Take 10 mg by mouth 3 (three) times daily as needed for itching or anxiety. ) 30 tablet 0  . blood glucose meter kit and supplies KIT Dispense based on patient and insurance preference. Use up to four times daily as directed. (FOR ICD-9 250.00, 250.01). 1 each 0  . chlorthalidone (HYGROTON) 50 MG tablet Take 50 mg by mouth daily.    . ferrous sulfate 325 (65 FE) MG EC tablet Take 1 tablet (325 mg total) by mouth 2 (two) times daily. 60 tablet 1  . folic acid (FOLVITE) 1 MG tablet Take 1 mg by mouth daily.    . furosemide (LASIX) 40 MG tablet Take 1 tablet (40 mg total) by mouth daily. 30 tablet 0  . oxyCODONE-acetaminophen (PERCOCET) 5-325 MG tablet Take 1 tablet by mouth every 6 (six) hours as needed for severe pain. (Patient not taking: Reported on 10/22/2019) 8 tablet 0    REVIEW OF SYSTEMS:  _0  denotes positive finding, _1  denotes negative finding Cardiac  Comments:  Chest pain or chest pressure:    Shortness of breath upon exertion:    Short of breath when lying flat:    Irregular heart rhythm:        Vascular    Pain in calf, thigh, or hip brought on by ambulation:    Pain in feet at night that wakes you up from your sleep:     Blood clot in your veins:    Leg swelling:         Pulmonary    Oxygen at home:    Productive cough:     Wheezing:         Neurologic    Sudden weakness in arms or legs:     Sudden numbness in arms or legs:     Sudden onset of difficulty speaking or slurred speech:    Temporary loss of vision in one eye:     Problems with dizziness:         Gastrointestinal    Blood in stool:     Vomited blood:         Genitourinary    Burning when urinating:     Blood in urine:        Psychiatric    Major depression:  Hematologic     Bleeding problems:    Problems with blood clotting too easily:        Skin    Rashes or ulcers:        Constitutional    Fever or chills:     PHYSICAL EXAM:   Vitals:   10/22/19 0710 10/22/19 0808 10/22/19 0809 10/22/19 0810  BP: (!) 152/79 (!) 158/82  (!) 158/92  Pulse: 96  89 89  Resp: _0 Temp: 98.4 F (36.9 C)   98.3 F (36.8 C)  TempSrc: Oral   Oral  SpO2: 100%  97% 97%    GENERAL: The patient is a well-nourished male, in no acute distress. The vital signs are documented above. CARDIAC: There is a regular rate and rhythm.  VASCULAR: His fistula has a good thrill in the left upper arm. There is no bleeding from the catheter exit site. PULMONARY: There is good air exchange bilaterally without wheezing or rales. ABDOMEN: Soft and non-tender with normal pitched bowel sounds.  MUSCULOSKELETAL: There are no major deformities or cyanosis. NEUROLOGIC: No focal weakness or paresthesias are detected. SKIN: There are no ulcers or rashes noted. PSYCHIATRIC: The patient has a normal affect.  DATA:     CHEST X-RAY:  No retained foreign body.

## 2019-10-22 NOTE — Progress Notes (Signed)
Spoke to [place] patients brother at [phone] and he will pick up patient from dialysis following treatment. Dialysis RN to call when patient is ready.

## 2019-10-22 NOTE — Anesthesia Preprocedure Evaluation (Addendum)
Anesthesia Evaluation  Patient identified by MRN, date of birth, ID band Patient awake    Reviewed: Allergy & Precautions, H&P , NPO status , Patient's Chart, lab work & pertinent test results  Airway Mallampati: II  TM Distance: >3 FB Neck ROM: Full    Dental no notable dental hx. (+) Teeth Intact, Dental Advisory Given   Pulmonary Current Smoker and Patient abstained from smoking.,    Pulmonary exam normal breath sounds clear to auscultation       Cardiovascular hypertension, Pt. on medications  Rhythm:Regular Rate:Normal     Neuro/Psych negative neurological ROS  negative psych ROS   GI/Hepatic negative GI ROS, Neg liver ROS,   Endo/Other  diabetesMorbid obesity  Renal/GU ESRF and DialysisRenal disease  negative genitourinary   Musculoskeletal   Abdominal   Peds  Hematology  (+) Blood dyscrasia, anemia ,   Anesthesia Other Findings   Reproductive/Obstetrics negative OB ROS                            Anesthesia Physical Anesthesia Plan  ASA: III  Anesthesia Plan: MAC   Post-op Pain Management:    Induction: Intravenous  PONV Risk Score and Plan: 1 and Propofol infusion and Ondansetron  Airway Management Planned: Simple Face Mask  Additional Equipment:   Intra-op Plan:   Post-operative Plan:   Informed Consent: I have reviewed the patients History and Physical, chart, labs and discussed the procedure including the risks, benefits and alternatives for the proposed anesthesia with the patient or authorized representative who has indicated his/her understanding and acceptance.     Dental advisory given  Plan Discussed with: CRNA  Anesthesia Plan Comments:         Anesthesia Quick Evaluation

## 2019-10-22 NOTE — ED Notes (Signed)
MD Scot Dock would like to be called when patient arrives back in the room. [phone]

## 2019-10-22 NOTE — Transfer of Care (Signed)
Immediate Anesthesia Transfer of Care Note  Patient: Leslye Peer K[place]oka  Procedure(s) Performed: INSERTION OF TUNNELED DIALYSIS CATHETER (N/A )  Patient Location: PACU  Anesthesia Type:MAC  Level of Consciousness: awake, alert  and oriented  Airway & Oxygen Therapy: Patient Spontanous Breathing  Post-op Assessment: Report given to RN and Post -op Vital signs reviewed and stable  Post vital signs: Reviewed and stable  Last Vitals:  Vitals Value Taken Time  BP 140/84 10/22/19 1508  Temp    Pulse 79 10/22/19 1509  Resp 16 10/22/19 1509  SpO2 98 % 10/22/19 1509  Vitals shown include unvalidated device data.  Last Pain:  Vitals:   10/22/19 1328  TempSrc:   PainSc: 6          Complications: No complications documented.

## 2019-10-22 NOTE — ED Notes (Signed)
Got patient on the monitor into a gown patient is resting with call bell in reach  

## 2019-10-22 NOTE — ED Notes (Signed)
1400- Schedule for OR 1230- Wash in Chlorhexidine

## 2019-10-22 NOTE — ED Notes (Signed)
Report given to short stay nurse. 

## 2019-10-23 ENCOUNTER — Encounter (HOSPITAL_COMMUNITY): Payer: Self-pay | Admitting: Vascular Surgery

## 2019-10-23 MED ORDER — HEPARIN SODIUM (PORCINE) 1000 UNIT/ML IJ SOLN
INTRAMUSCULAR | Status: AC
  Administered 2019-10-23: 1000 [IU]
  Filled 2019-10-23: qty 4

## 2019-10-23 NOTE — Progress Notes (Signed)
   10/23/19 0150  Hand-Off documentation  Handoff Given Given to shift RN/LPN  Report given to (Full Name) Brittney   Handoff Received Received from shift RN/LPN  Report received from (Full Name) Kerrilyn Azbill, RN  Vital Signs  Temp 98.6 F (37 C)  Temp Source Oral  Pulse Rate 82  Resp 16  BP (!) 175/100  BP Location Right Arm  BP Method Automatic  Patient Position (if appropriate) Lying  Oxygen Therapy  SpO2 99 %  O2 Device Room Air  Pain Assessment  Pain Scale 0-10  Pain Score 0  Post-Hemodialysis Assessment  Rinseback Volume (mL) 250 mL  KECN 275 V  Dialyzer Clearance Heavily streaked  Duration of HD Treatment -hour(s) 3.5 hour(s)  Hemodialysis Intake (mL) 500 mL  UF Total -Machine (mL) 3004 mL  Net UF (mL) 2504 mL  Tolerated HD Treatment Yes  Post-Hemodialysis Comments pt tolerated the HD tx very well,. tx achieved as expected with no complaints.  AVG/AVF Arterial Site Held (minutes) 0 minutes  AVG/AVF Venous Site Held (minutes) 0 minutes  Education / Care Plan  Dialysis Education Provided Yes  Hemodialysis Catheter Right Internal jugular Double lumen Permanent (Tunneled)  Placement Date/Time: 10/22/19 1448   Placed prior to admission: No  Time Out: Correct patient;Correct site;Correct procedure  Maximum sterile barrier precautions: Mask;Hand hygiene;Cap;Large sterile sheet;Sterile gloves  Site Prep: (c) Other (comment)...  Site Condition No complications  Blue Lumen Status Heparin locked;Capped (Central line)  Red Lumen Status Heparin locked;Capped (Central line)  Catheter fill solution Heparin 1000 units/ml  Catheter fill volume (Arterial) 1.9 cc  Catheter fill volume (Venous) 1.9  Dressing Type Gauze/Drain sponge  Dressing Status Clean;Dry;Intact  Drainage Description None

## 2019-10-23 NOTE — ED Provider Notes (Signed)
  2:54 AM Patient returns from having dialysis catheter replaced.  He did receive full dialysis treatment afterwards.  He states he feels great.  Vitals are stable.  Lungs clear, no distress.  Dialysis catheter appears clean.  He is anxious to go home.  Feel this is appropriate.  He will resume his normal dialysis schedule.  Close follow-up with PCP.  Return here for any new/acute changes.   Larene Pickett, PA-C 10/23/19 [place], [place], MD 10/23/19 [phone]

## 2019-10-23 NOTE — ED Notes (Signed)
Pt verbalized understanding of d/c instructions, follow up acre and s/s requiring return to ed. Pt had no additional questions and was transported via wheelchair to exit.

## 2019-11-11 ENCOUNTER — Ambulatory Visit (INDEPENDENT_AMBULATORY_CARE_PROVIDER_SITE_OTHER): Payer: Self-pay | Admitting: Physician Assistant

## 2019-11-11 ENCOUNTER — Other Ambulatory Visit: Payer: Self-pay

## 2019-11-11 VITALS — BP 151/90 | HR 90 | Temp 98.2°F | Resp 20 | Ht 71.0 in | Wt 263.4 lb

## 2019-11-11 DIAGNOSIS — N186 End stage renal disease: Secondary | ICD-10-CM

## 2019-11-11 NOTE — Progress Notes (Signed)
  POST OPERATIVE DIALYSIS ACCESS OFFICE NOTE    CC:  F/u for dialysis access surgery  HPI:  This is a 59 y.o. male who is s/p left BC AVF on 08/03/2019 by Dr. Donzetta Matters. He subsequently underwent side-branch ligation and superficialization by Dr. Carlis Abbott on 10/17/2019. No complaints today. Denies hand pain or numbness.   Dialysis days:  TTS   Dialysis center:  [facility] [address]   No Known Allergies  Current Outpatient Medications  Medication Sig Dispense Refill  . amLODipine (NORVASC) 10 MG tablet Take 1 tablet (10 mg total) by mouth daily. 90 tablet 1  . blood glucose meter kit and supplies KIT Dispense based on patient and insurance preference. Use up to four times daily as directed. (FOR ICD-9 250.00, 250.01). 1 each 0  . chlorthalidone (HYGROTON) 50 MG tablet Take 50 mg by mouth daily.    . cloNIDine (CATAPRES) 0.2 MG tablet Take  1 tablet twice daily . If makes drowsy take 2 tablets - after he is off of work (Patient taking differently: Take 0.2 mg by mouth See admin instructions. Take 1 tablet (0.2 mg) by mouth twice daily, if it makes you drowsy take 2 tablets (0.4 mg) after work) 180 tablet 1  . ferrous sulfate 325 (65 FE) MG tablet SMARTSIG:1 Tablet(s) By Mouth Twice Daily    . gabapentin (NEURONTIN) 300 MG capsule Take 1 capsule (300 mg total) by mouth See admin instructions. Take '300mg'$  twice daily and '600mg'$  at bedtime. 90 capsule 2  . hydrochlorothiazide (HYDRODIURIL) 25 MG tablet Take 25 mg by mouth daily.    . hydrOXYzine (ATARAX/VISTARIL) 25 MG tablet Take 25 mg by mouth 2 (two) times daily as needed.    [name] oxyCODONE (ROXICODONE) 5 MG immediate release tablet Take 1 tablet (5 mg total) by mouth every 4 (four) hours as needed. 12 tablet 0  . oxyCODONE-acetaminophen (PERCOCET) 5-325 MG tablet Take 1 tablet by mouth every 6 (six) hours as needed for severe pain. 8 tablet 0  . furosemide (LASIX) 40 MG tablet Take 1 tablet (40 mg total) by mouth daily. 30 tablet 0   No current  facility-administered medications for this visit.     ROS:  See HPI  BP (!) 151/90 (BP Location: Right Arm, Patient Position: Sitting, Cuff Size: Large)   Pulse 90   Temp 98.2 F (36.8 C) (Temporal)   Resp 20   Ht '5\' 11"'$  (1.803 m)   Wt 263 lb 6.4 oz (119.5 kg)   SpO2 95%   BMI 36.74 kg/m    Physical Exam:  General appearance:in NAD Cardiac:RRR Respiratory:nonlabored Incision:  Well healed Extremities:  5/5 grip strength sensation intact. +thrill and normal bruit in fistula. Hand warm and well perfused  Assessment/Plan:   -s/p LUE AVF post-op 13 weeks -pt does not have evidence of steal syndrome - fistula easily palpated along its course in the left upper arm -the fistula may be used immediately -please notify us when appropriate to remove [place], PA-C 11/11/2019 8:47 AM [facility] [phone]  Clinic MD:  Donzetta Matters

## 2019-12-17 ENCOUNTER — Emergency Department (HOSPITAL_COMMUNITY)
Admission: EM | Admit: 2019-12-17 | Discharge: 2019-12-17 | Disposition: A | Discharge status: Home / Self Care | Payer: Medicare Other | Attending: Emergency Medicine | Admitting: Emergency Medicine

## 2019-12-17 ENCOUNTER — Encounter (HOSPITAL_COMMUNITY): Payer: Self-pay | Admitting: Emergency Medicine

## 2019-12-17 ENCOUNTER — Other Ambulatory Visit: Payer: Self-pay

## 2019-12-17 ENCOUNTER — Emergency Department (HOSPITAL_COMMUNITY): Payer: Medicare Other

## 2019-12-17 DIAGNOSIS — N186 End stage renal disease: Secondary | ICD-10-CM | POA: Insufficient documentation

## 2019-12-17 DIAGNOSIS — Z992 Dependence on renal dialysis: Secondary | ICD-10-CM | POA: Insufficient documentation

## 2019-12-17 DIAGNOSIS — F17210 Nicotine dependence, cigarettes, uncomplicated: Secondary | ICD-10-CM | POA: Diagnosis not present

## 2019-12-17 DIAGNOSIS — I959 Hypotension, unspecified: Secondary | ICD-10-CM | POA: Diagnosis not present

## 2019-12-17 DIAGNOSIS — E119 Type 2 diabetes mellitus without complications: Secondary | ICD-10-CM | POA: Diagnosis not present

## 2019-12-17 DIAGNOSIS — R42 Dizziness and giddiness: Secondary | ICD-10-CM | POA: Insufficient documentation

## 2019-12-17 DIAGNOSIS — I120 Hypertensive chronic kidney disease with stage 5 chronic kidney disease or end stage renal disease: Secondary | ICD-10-CM | POA: Diagnosis not present

## 2019-12-17 HISTORY — DX: End stage renal disease: N18.6

## 2019-12-17 LAB — CBC
HCT: 28.4 % — ABNORMAL LOW (ref 39.0–52.0)
Hemoglobin: 9.1 g/dL — ABNORMAL LOW (ref 13.0–17.0)
MCH: 34.3 pg — ABNORMAL HIGH (ref 26.0–34.0)
MCHC: 32 g/dL (ref 30.0–36.0)
MCV: 107.2 fL — ABNORMAL HIGH (ref 80.0–100.0)
Platelets: 141 10*3/uL — ABNORMAL LOW (ref 150–400)
RBC: 2.65 MIL/uL — ABNORMAL LOW (ref 4.22–5.81)
RDW: 14.4 % (ref 11.5–15.5)
WBC: 5.4 10*3/uL (ref 4.0–10.5)
nRBC: 0.4 % — ABNORMAL HIGH (ref 0.0–0.2)

## 2019-12-17 LAB — BASIC METABOLIC PANEL
Anion gap: 18 — ABNORMAL HIGH (ref 5–15)
BUN: 67 mg/dL — ABNORMAL HIGH (ref 6–20)
CO2: 21 mmol/L — ABNORMAL LOW (ref 22–32)
Calcium: 9.4 mg/dL (ref 8.9–10.3)
Chloride: 96 mmol/L — ABNORMAL LOW (ref 98–111)
Creatinine, Ser: 11.21 mg/dL — ABNORMAL HIGH (ref 0.61–1.24)
GFR calc Af Amer: 5 mL/min — ABNORMAL LOW (ref >60)
GFR calc non Af Amer: 4 mL/min — ABNORMAL LOW (ref >60)
Glucose, Bld: 187 mg/dL — ABNORMAL HIGH (ref 70–99)
Potassium: 4 mmol/L (ref 3.5–5.1)
Sodium: 135 mmol/L (ref 135–145)

## 2019-12-17 MED ORDER — SODIUM CHLORIDE 0.9% FLUSH: 3.0000 mL | Freq: Once | INTRAVENOUS | Status: DC

## 2019-12-17 MED ORDER — SODIUM CHLORIDE 0.9 % IV BOLUS
500.0000 mL | Freq: Once | INTRAVENOUS | Status: AC
  Administered 2019-12-17: 500 mL via INTRAVENOUS

## 2019-12-17 NOTE — Discharge Instructions (Signed)
Make sure you do not take your blood pressure medications (the Clonidine (Catapres) and the Amlodopine (Norvasc)) until you see your dialysis doctor on Tuesday. Please make sure to discuss your symptoms that you have been experiencing the last month (dizziness and low BP) and that you have stopped your medications per the hospital kidney doctors request. Please make sure to go to dialysis on Tuesday and do not miss your appointment. Return to the ER if your symptoms worsen.

## 2019-12-17 NOTE — ED Provider Notes (Signed)
[facility] EMERGENCY DEPARTMENT Provider Note   CSN: [id number] Arrival date & time: 12/17/19  0301     History Chief Complaint  Patient presents with  . near syncope    Daniel [name] is a 59 y.o. male.  HPI 59 year old male with a history of DM type II, ESRD on dialysis TTHS, hypertension, ICH presents to the ER with dizziness and feeling like he's going to pass out for approximately a month. Patient states dizziness is at its worse when he stands up and when he tries to walk "he walks funny". He says his gate improves after some time and his dizziness improves.  Denies any unilateral weakness.  States he has been compliant with dialysis, however missed today's session due to being in the ER. Has been taking his blood pressure medication. Patient poor historian, unaware of who his nephrologist is.  States he started dialysis recently, several months ago.  Per chart review this appears to be in April.  Denies any chest pain or shortness of breath.  No noticeable leg swelling.  No hematuria or hematochezia. No fevers, cough or chills.      Past Medical History:  Diagnosis Date  . Diabetes mellitus without complication ([place])   . End stage chronic kidney disease ([place])   . Hypertension   . ICH (intracerebral hemorrhage) ([place]) 05/20/2017  . Shoulder pain, left 06/28/2013    Patient Active Problem List   Diagnosis Date Noted  . ESRD (end stage renal disease) ([place])   . AVM (arteriovenous malformation) of small bowel, acquired with hemorrhage   . Symptomatic anemia 07/31/2019  . Acute on chronic anemia 07/23/2019  . Chronic kidney disease, stage V ([place])   . Chronic hepatitis C without hepatic coma ([place]) 07/11/2019  . Iron deficiency anemia 03/30/2019  . CKD (chronic kidney disease), stage V ([place]) 03/30/2019  . Alcohol use disorder 03/30/2019  . B12 deficiency 03/30/2019  . Orthostatic hypotension 01/22/2019  . Anemia   . Hypertension 06/29/2016  . DM2  (diabetes mellitus, type 2) ([place]) 06/29/2016    Past Surgical History:  Procedure Laterality Date  . APPENDECTOMY    . AV FISTULA PLACEMENT Left 08/03/2019   Procedure: LEFT ARM ARTERIOVENOUS (AV) CEPHALIC  FISTULA CREATION;  Surgeon: Waynetta Sandy, MD;  Location: [facility];  Service: Vascular;  Laterality: Left;  . BIOPSY  06/30/2019   Procedure: BIOPSY;  Surgeon: Ronald Lobo, MD;  Location: [facility];  Service: Endoscopy;;  . BIOPSY  08/02/2019   Procedure: BIOPSY;  Surgeon: Yetta Flock, MD;  Location: [facility];  Service: Gastroenterology;;  . COLONOSCOPY  01/23/2012   Procedure: COLONOSCOPY;  Surgeon: Danie Binder, MD;  [facility];  Service: Endoscopy;  Laterality: N/A;  11:10 AM  . COLONOSCOPY WITH PROPOFOL N/A 06/30/2019   Procedure: COLONOSCOPY WITH PROPOFOL;  Surgeon: Ronald Lobo, MD;  Location: [facility];  Service: Endoscopy;  Laterality: N/A;  . ENTEROSCOPY N/A 08/02/2019   Procedure: ENTEROSCOPY;  Surgeon: Yetta Flock, MD;  Location[facility] ENDOSCOPY;  Service: Gastroenterology;  Laterality: N/A;  . ESOPHAGOGASTRODUODENOSCOPY (EGD) WITH PROPOFOL N/A 06/30/2019   Procedure: ESOPHAGOGASTRODUODENOSCOPY (EGD) WITH PROPOFOL;  Surgeon: Ronald Lobo, MD;  Location: [facility];  Service: Endoscopy;  Laterality: N/A;  . FISTULA SUPERFICIALIZATION Left 10/17/2019   Procedure: LEFT UPPER EXTREMITY FISTULA REVISION, SIDE BRANCH LIGATION,  AND SUPERFICIALIZATION;  Surgeon: Marty Heck, MD;  Location: [facility];  Service: Vascular;  Laterality: Left;  . GIVENS CAPSULE STUDY N/A 06/30/2019  Procedure: GIVENS CAPSULE STUDY;  Surgeon: Ronald Lobo, MD;  Location: [facility] ENDOSCOPY;  Service: Endoscopy;  Laterality: N/A;  . HEMOSTASIS CONTROL  08/02/2019   Procedure: HEMOSTASIS CONTROL;  Surgeon: Yetta Flock, MD;  Location: [facility] ENDOSCOPY;  Service: Gastroenterology;;  . INCISION AND DRAINAGE ABSCESS N/A 06/29/2016   Procedure: INCISION AND  DRAINAGE ABDOMINAL WALL ABSCESS;  Surgeon: Alphonsa Overall, MD;  Location: [facility];  Service: General;  Laterality: N/A;  . INSERTION OF DIALYSIS CATHETER Right 08/03/2019   Procedure: INSERTION OF DIALYSIS CATHETER;  Surgeon: Waynetta Sandy, MD;  Location: [facility];  Service: Vascular;  Laterality: Right;  . INSERTION OF DIALYSIS CATHETER Right 10/22/2019   Procedure: INSERTION OF 23CM TUNNELED DIALYSIS CATHETER RIGHT INTERNAL JUGULAR;  Surgeon: Angelia Mould, MD;  Location: [facility];  Service: Vascular;  Laterality: Right;  . Left heel surgery         Family History  Problem Relation Age of Onset  . Colon cancer Neg Hx     Social History   Tobacco Use  . Smoking status: Current Every Day Smoker    Packs/day: 1.00    Years: 30.00    Pack years: 30.00    Types: Cigarettes  . Smokeless tobacco: Never Used  Substance Use Topics  . Alcohol use: Yes    Alcohol/week: 12.0 standard drinks    Types: 12 Cans of beer per week  . Drug use: Yes    Types: "Crack" cocaine    Comment: last in 2020    Home Medications Prior to Admission medications   Medication Sig Start Date End Date Taking? Authorizing Provider  amLODipine (NORVASC) 10 MG tablet Take 1 tablet (10 mg total) by mouth daily. 07/13/19 07/12/20  [name], NP  blood glucose meter kit and supplies KIT Dispense based on patient and insurance preference. Use up to four times daily as directed. (FOR ICD-9 250.00, 250.01). 07/01/16   Nita Sells, MD  chlorthalidone (HYGROTON) 50 MG tablet Take 50 mg by mouth daily. 08/11/19   [provider]  cloNIDine (CATAPRES) 0.2 MG tablet Take  1 tablet twice daily . If makes drowsy take 2 tablets - after he is off of work Patient taking differently: Take 0.2 mg by mouth See admin instructions. Take 1 tablet (0.2 mg) by mouth twice daily, if it makes you drowsy take 2 tablets (0.4 mg) after work 07/13/19   [name], NP  ferrous sulfate 325 (65 FE) MG  tablet SMARTSIG:1 Tablet(s) By Mouth Twice Daily 08/11/19   [provider]  furosemide (LASIX) 40 MG tablet Take 1 tablet (40 mg total) by mouth daily. 07/25/19 10/22/19  Ladona Horns, MD  gabapentin (NEURONTIN) 300 MG capsule Take 1 capsule (300 mg total) by mouth See admin instructions. Take 324m twice daily and 6070mat bedtime. 08/16/19   EdKerin PernaNP  hydrochlorothiazide (HYDRODIURIL) 25 MG tablet Take 25 mg by mouth daily.    [provider]  hydrOXYzine (ATARAX/VISTARIL) 25 MG tablet Take 25 mg by mouth 2 (two) times daily as needed. 11/01/19   [provider]  oxyCODONE (ROXICODONE) 5 MG immediate release tablet Take 1 tablet (5 mg total) by mouth every 4 (four) hours as needed. 10/22/19   DiAngelia MouldMD  oxyCODONE-acetaminophen (PERCOCET) 5-325 MG tablet Take 1 tablet by mouth every 6 (six) hours as needed for severe pain. 10/17/19   RhGabriel EaringPA-C    Allergies    Patient has no known allergies.  Review of Systems   Review of Systems  Constitutional: Negative for chills and fever.  HENT: Negative for ear pain and sore throat.   Eyes: Negative for pain and visual disturbance.  Respiratory: Negative for cough and shortness of breath.   Cardiovascular: Negative for chest pain and palpitations.  Gastrointestinal: Negative for abdominal pain and vomiting.  Genitourinary: Negative for dysuria and hematuria.  Musculoskeletal: Negative for arthralgias and back pain.  Skin: Negative for color change and rash.  Neurological: Positive for dizziness and weakness. Negative for seizures and syncope.  Psychiatric/Behavioral: Negative for confusion.  All other systems reviewed and are negative.   Physical Exam Updated Vital Signs BP 120/79 (BP Location: Right Arm)   Pulse 63   Temp 97.6 F (36.4 C) (Oral)   Resp 16   Ht 5' 11.5" (1.816 m)   Wt 113.4 kg   SpO2 98%   BMI 34.38 kg/m   Physical Exam Vitals and nursing note reviewed.    Constitutional:      General: He is not in acute distress.    Appearance: Normal appearance. He is well-developed. He is obese. He is not ill-appearing or toxic-appearing.  HENT:     Head: Normocephalic and atraumatic.     Nose: Nose normal.     Mouth/Throat:     Mouth: Mucous membranes are moist.     Pharynx: Oropharynx is clear.  Eyes:     Conjunctiva/sclera: Conjunctivae normal.  Cardiovascular:     Rate and Rhythm: Normal rate and regular rhythm.     Pulses: Normal pulses.     Heart sounds: Normal heart sounds. No murmur heard.   Pulmonary:     Effort: Pulmonary effort is normal. No respiratory distress.     Breath sounds: Normal breath sounds.  Abdominal:     Palpations: Abdomen is soft.     Tenderness: There is no abdominal tenderness.  Musculoskeletal:        General: Normal range of motion.     Cervical back: Normal range of motion and neck supple.     Right lower leg: No edema.     Left lower leg: No edema.  Skin:    General: Skin is warm and dry.     Capillary Refill: Capillary refill takes less than 2 seconds.     Findings: No erythema or rash.  Neurological:     General: No focal deficit present.     Mental Status: He is alert and oriented to person, place, and time.     Sensory: No sensory deficit.     Motor: No weakness.     Comments: Mental Status:  Alert, thought content appropriate, able to give a coherent history. Speech fluent without evidence of aphasia. Able to follow 2 step commands without difficulty.  Cranial Nerves:  II: Peripheral visual fields grossly normal, pupils equal, round, reactive to light III,IV, VI: ptosis not present, extra-ocular motions intact bilaterally  V,VII: smile symmetric, facial light touch sensation equal VIII: hearing grossly normal to voice  X: uvula elevates symmetrically  XI: bilateral shoulder shrug symmetric and strong XII: midline tongue extension without fassiculations Motor:  Normal tone. 5/5 strength of BUE  and BLE major muscle groups including strong and equal grip strength and dorsiflexion/plantar flexion Sensory: light touch normal in all extremities. Cerebellar: normal finger-to-nose with bilateral upper extremities, Romberg sign absent Gait: not accessed       ED Results / Procedures / Treatments   Labs (all labs ordered are listed, but  only abnormal results are displayed) Labs Reviewed  BASIC METABOLIC PANEL - Abnormal; Notable for the following components:      Result Value   Chloride 96 (*)    CO2 21 (*)    Glucose, Bld 187 (*)    BUN 67 (*)    Creatinine, Ser 11.21 (*)    GFR calc non Af Amer 4 (*)    GFR calc Af Amer 5 (*)    Anion gap 18 (*)    All other components within normal limits  CBC - Abnormal; Notable for the following components:   RBC 2.65 (*)    Hemoglobin 9.1 (*)    HCT 28.4 (*)    MCV 107.2 (*)    MCH 34.3 (*)    Platelets 141 (*)    nRBC 0.4 (*)    All other components within normal limits  URINALYSIS, ROUTINE W REFLEX MICROSCOPIC  CBG MONITORING, ED    EKG EKG Interpretation  Date/Time:  Saturday December 17 2019 08:49:23 EDT Ventricular Rate:  79 PR Interval:  188 QRS Duration: 78 QT Interval:  398 QTC Calculation: 456 R Axis:   -24 Text Interpretation: Normal sinus rhythm Minimal voltage criteria for LVH, may be normal variant ( R in aVL ) Inferior infarct , age undetermined Possible Anterior infarct , age undetermined No significant change since last tracing Confirmed by Blanchie Dessert [phone]) on 12/17/2019 5:02:22 PM   Radiology DG Chest 2 View  Result Date: 12/17/2019 CLINICAL DATA:  Near syncope. EXAM: CHEST - 2 VIEW COMPARISON:  October 22, 2019 FINDINGS: Mild, stable chronic appearing increased lung markings are seen. A trace amount of atelectasis is noted within the left lung base. There is no evidence of a pleural effusion or pneumothorax. The cardiac silhouette is mildly enlarged. There is mild calcification of the aortic arch. The  visualized skeletal structures are unremarkable. IMPRESSION: Chronic appearing increased lung markings without acute or active cardiopulmonary disease. Electronically Signed   By: Virgina Norfolk M.D.   On: 12/17/2019 17:50    Procedures Procedures (including critical care time)  Medications Ordered in ED Medications  sodium chloride flush (NS) 0.9 % injection 3 mL (3 mLs Intravenous Not Given 12/17/19 1639)  sodium chloride 0.9 % bolus 500 mL (0 mLs Intravenous Stopped 12/17/19 2013)    ED Course  I have reviewed the triage vital signs and the nursing notes.  Pertinent labs & imaging results that were available during my care of the patient were reviewed by me and considered in my medical decision making (see chart for details).    MDM Rules/Calculators/A&P                         59 year old male presents with 1 month of dizziness, worse with getting up and walking On presentation, he is alert, oriented, nontoxic-appearing, speaking full sentences, without increased work of breathing.  Vitals with soft blood pressures as low as 100/61.  Not tachycardic, tachypneic or hypoxic.  No gross neurologic abnormalities noted.  Patient did have significant orthostatic vitals, with blood pressure of 127/76 lying, symptomatic with sitting in a blood pressure 104/72, and a blood pressure of 91/63 with visible wobbliness and dizziness with standing.  Lab work without evidence of anemia, no evidence of infection.  Chest x-ray without evidence of pneumonia, no widened mediastinum.  UA pending however the patient does not not have any urinary symptoms at this time, does make a little bit of urine.  EKG normal sinus rhythm.  Discussed the case with Dr. Maryan Rued, suspect that his dizziness and orthostasis is secondary to his recent initiation of dialysis and him continuing to take his blood pressure medications.  Consulted Dr. Joylene Grapes with nephrology, he recommends 500 cc bolus and reassessing.  Patient will  need to stop blood pressure medications, and reassess the patient.  If he continues to be symptomatic, he will need to be admitted for observation.  9:20 PM: Patient received 500 cc of fluid.  Reports significant improvement in dizziness.  Was able to ambulate in the ED without symptoms.  Repeat orthostatic vital signs much improved, with lying blood pressure of 120/79 and standing blood pressure 104/74.  Patient was instructed to hold his amlodipine and Catapres until his dialysis appointment on Tuesday.  I was not able to locate his nephrologist information through my chart.  Patient was given strict instructions to discuss his symptoms with the dialysis center and to tell them that he has stopped his medications.  Strict return precautions discussed.  Patient voices understanding and is agreeable.  Case discussed with Dr. Maryan Rued who is agreeable to the above plan and disposition.   Final Clinical Impression(s) / ED Diagnoses Final diagnoses:  Dizziness  Hypotension, unspecified hypotension type    Rx / DC Orders ED Discharge Orders    None       Lyndel Safe 12/17/19 2124    Blanchie Dessert, MD 12/17/19 2332

## 2019-12-17 NOTE — ED Notes (Signed)
Discharge instructions reviewed w/ pt along w/ follow-up care and medication instructions per nephrologist and importance of not missing dialysis. Pt verbalized understanding. Pt A&Ox4, VSS, ambulatory w/ steady gait upon departure.

## 2019-12-17 NOTE — ED Notes (Signed)
Pt transported to Xray. 

## 2019-12-17 NOTE — ED Triage Notes (Signed)
Pt reports feeling like he is going to pass out and dizziness x 1 month.  Denies pain.  Missed dialysis today.  States he went on Thursday.

## 2019-12-21 ENCOUNTER — Ambulatory Visit: Payer: MEDICAID | Admitting: Podiatry

## 2019-12-28 ENCOUNTER — Other Ambulatory Visit: Payer: Self-pay

## 2019-12-28 ENCOUNTER — Encounter (INDEPENDENT_AMBULATORY_CARE_PROVIDER_SITE_OTHER): Payer: Self-pay | Admitting: Primary Care

## 2019-12-28 ENCOUNTER — Ambulatory Visit (INDEPENDENT_AMBULATORY_CARE_PROVIDER_SITE_OTHER): Payer: Medicare Other | Admitting: Primary Care

## 2019-12-28 VITALS — BP 145/92 | HR 92 | Temp 97.6°F | Resp 16 | Ht 71.0 in | Wt 259.0 lb

## 2019-12-28 DIAGNOSIS — Z13220 Encounter for screening for lipoid disorders: Secondary | ICD-10-CM

## 2019-12-28 DIAGNOSIS — L299 Pruritus, unspecified: Secondary | ICD-10-CM | POA: Diagnosis not present

## 2019-12-28 DIAGNOSIS — I10 Essential (primary) hypertension: Secondary | ICD-10-CM

## 2019-12-28 DIAGNOSIS — N185 Chronic kidney disease, stage 5: Secondary | ICD-10-CM

## 2019-12-28 DIAGNOSIS — R7303 Prediabetes: Secondary | ICD-10-CM

## 2019-12-28 DIAGNOSIS — K5909 Other constipation: Secondary | ICD-10-CM

## 2019-12-28 LAB — POCT GLYCOSYLATED HEMOGLOBIN (HGB A1C): Hemoglobin A1C: 5.2 % (ref 4.0–5.6)

## 2019-12-28 MED ORDER — HYDROXYZINE HCL 25 MG PO TABS
25.0000 mg | ORAL_TABLET | Freq: Two times a day (BID) | ORAL | 1 refills | Status: DC | PRN
Start: 2019-12-28 — End: 2020-02-23

## 2019-12-28 NOTE — Progress Notes (Signed)
Assessment and Plan: Diagnoses and all orders for this visit:  Prediabetes -     CMP14+EGFR -     Lipid Panel -     HgB A1c 5.2 Per ADA guidelines patient doe not have pre or diabetes with A1C 5,2  Pruritus Contact dermatitis unknown etiology , errupted healed lesions bilateral arms  -     CMP14+EGFR -     Lipid Panel  Other constipation -     CMP14+EGFR -     Ambulatory referral to Gastroenterology  CKD (chronic kidney disease), stage V (H[facility]) -     CMP14+EGFR  Lipid screening -     Lipid Panel  Essential hypertension -     Lipid Panel  Other orders -     hydrOXYzine (ATARAX/VISTARIL) 25 MG tablet; Take 1 tablet (25 mg total) by mouth 2 (two) times daily as needed.     HPI Mr.Hoang C. Basley 59 y.o.male presents for follow up from the hospital.Patient presents to the emergency room on 12/17/2019 with 1 month of dizziness, worse with getting up and walking.   Past Medical History:  Diagnosis Date  . Diabetes mellitus without complication ([place])   . End stage chronic kidney disease ([place])   . Hypertension   . ICH (intracerebral hemorrhage) ([place]) 05/20/2017  . Shoulder pain, left 06/28/2013     No Known Allergies    Current Outpatient Medications on File Prior to Visit  Medication Sig Dispense Refill  . amLODipine (NORVASC) 10 MG tablet Take 1 tablet (10 mg total) by mouth daily. 90 tablet 1  . blood glucose meter kit and supplies KIT Dispense based on patient and insurance preference. Use up to four times daily as directed. (FOR ICD-9 250.00, 250.01). 1 each 0  . chlorthalidone (HYGROTON) 50 MG tablet Take 50 mg by mouth daily.    . cloNIDine (CATAPRES) 0.2 MG tablet Take  1 tablet twice daily . If makes drowsy take 2 tablets - after he is off of work (Patient taking differently: Take 0.2 mg by mouth See admin instructions. Take 1 tablet (0.2 mg) by mouth twice daily, if it makes you drowsy take 2 tablets (0.4 mg) after work) 180 tablet 1  . ferrous sulfate 325 (65  FE) MG tablet SMARTSIG:1 Tablet(s) By Mouth Twice Daily    . furosemide (LASIX) 40 MG tablet Take 1 tablet (40 mg total) by mouth daily. 30 tablet 0  . gabapentin (NEURONTIN) 300 MG capsule Take 1 capsule (300 mg total) by mouth See admin instructions. Take 339m twice daily and 6079mat bedtime. 90 capsule 2  . hydrochlorothiazide (HYDRODIURIL) 25 MG tablet Take 25 mg by mouth daily.    . hydrOXYzine (ATARAX/VISTARIL) 25 MG tablet Take 25 mg by mouth 2 (two) times daily as needed.    . Marland KitchenxyCODONE (ROXICODONE) 5 MG immediate release tablet Take 1 tablet (5 mg total) by mouth every 4 (four) hours as needed. 12 tablet 0  . oxyCODONE-acetaminophen (PERCOCET) 5-325 MG tablet Take 1 tablet by mouth every 6 (six) hours as needed for severe pain. 8 tablet 0   No current facility-administered medications on file prior to visit.    ROS: all negative except above.   Physical Exam: Filed Weights   12/28/19 1515  Weight: 259 lb (117.5 kg)   BP (!) 145/92   Pulse 92   Temp 97.6 F (36.4 C)   Resp 16   Ht _0  (1.803 m)   Wt 259 lb (117.5 kg)  SpO2 96%   BMI 36.12 kg/m  General Appearance: Well nourished, in no apparent distress. Eyes: PERRLA, EOMs, conjunctiva no swelling or erythema Sinuses: No Frontal/maxillary tenderness ENT/Mouth: Ext aud canals clear, TMs without erythema, bulging. No erythema, swelling, or exudate on post pharynx.  Tonsils not swollen or erythematous. Hearing normal.  Neck: Supple, thyroid normal.  Respiratory: Respiratory effort normal, BS equal bilaterally without rales, rhonchi, wheezing or stridor.  Cardio: RRR with no MRGs. Brisk peripheral pulses without edema.  Abdomen: Soft, + BS.  Non tender, no guarding, rebound, hernias, masses. Lymphatics: Non tender without lymphadenopathy.  Musculoskeletal: Full ROM, 5/5 strength, normal gait.  Skin: Warm, dry  errupted healed lesions bilateral arms without rashes, lesions,  Neuro: Cranial nerves intact. Normal muscle  tone, no cerebellar symptoms. Sensation intact.  Psych: Awake and oriented X 3, normal affect, Insight and Judgment appropriate.     [name], NP 4:10 PM

## 2019-12-28 NOTE — Progress Notes (Signed)
[facility] pt stated not doing well since out of the hospital/

## 2019-12-29 ENCOUNTER — Other Ambulatory Visit (INDEPENDENT_AMBULATORY_CARE_PROVIDER_SITE_OTHER): Payer: Self-pay | Admitting: Primary Care

## 2019-12-29 DIAGNOSIS — E782 Mixed hyperlipidemia: Secondary | ICD-10-CM

## 2019-12-29 LAB — CMP14+EGFR
ALT: 29 IU/L (ref 0–44)
AST: 75 IU/L — ABNORMAL HIGH (ref 0–40)
Albumin/Globulin Ratio: 0.9 — ABNORMAL LOW (ref 1.2–2.2)
Albumin: 3.9 g/dL (ref 3.8–4.9)
Alkaline Phosphatase: 52 IU/L (ref 48–121)
BUN/Creatinine Ratio: 4 — ABNORMAL LOW (ref 10–24)
BUN: 31 mg/dL — ABNORMAL HIGH (ref 8–27)
Bilirubin Total: 0.5 mg/dL (ref 0.0–1.2)
CO2: 25 mmol/L (ref 20–29)
Calcium: 8.9 mg/dL (ref 8.6–10.2)
Chloride: 86 mmol/L — ABNORMAL LOW (ref 96–106)
Creatinine, Ser: 8.15 mg/dL — ABNORMAL HIGH (ref 0.76–1.27)
GFR calc Af Amer: 7 mL/min/{1.73_m2} — ABNORMAL LOW (ref >59)
GFR calc non Af Amer: 6 mL/min/{1.73_m2} — ABNORMAL LOW (ref >59)
Globulin, Total: 4.5 g/dL (ref 1.5–4.5)
Glucose: 85 mg/dL (ref 65–99)
Potassium: 3.3 mmol/L — ABNORMAL LOW (ref 3.5–5.2)
Sodium: 128 mmol/L — ABNORMAL LOW (ref 134–144)
Total Protein: 8.4 g/dL (ref 6.0–8.5)

## 2019-12-29 LAB — LIPID PANEL
Chol/HDL Ratio: 2.9 ratio (ref 0.0–5.0)
Cholesterol, Total: 103 mg/dL (ref 100–199)
HDL: 35 mg/dL — ABNORMAL LOW (ref >39)
LDL Chol Calc (NIH): 32 mg/dL (ref 0–99)
Triglycerides: 235 mg/dL — ABNORMAL HIGH (ref 0–149)
VLDL Cholesterol Cal: 36 mg/dL (ref 5–40)

## 2019-12-29 MED ORDER — OMEGA-3-FATTY ACID/PLACEBO 1000 MG CAPSULE #180 S0927: 3.0000 | ORAL_CAPSULE | Freq: Two times a day (BID) | ORAL | 0 refills | Status: DC

## 2020-01-04 ENCOUNTER — Ambulatory Visit: Payer: Medicare Other | Admitting: Podiatry

## 2020-01-31 ENCOUNTER — Ambulatory Visit (HOSPITAL_COMMUNITY)
Admission: EM | Admit: 2020-01-31 | Discharge: 2020-01-31 | Disposition: A | Discharge status: Home / Self Care | Payer: Medicare Other | Attending: Urgent Care | Admitting: Urgent Care

## 2020-01-31 ENCOUNTER — Encounter (HOSPITAL_COMMUNITY): Payer: Self-pay

## 2020-01-31 ENCOUNTER — Other Ambulatory Visit: Payer: Self-pay

## 2020-01-31 DIAGNOSIS — N4889 Other specified disorders of penis: Secondary | ICD-10-CM

## 2020-01-31 DIAGNOSIS — N481 Balanitis: Secondary | ICD-10-CM | POA: Diagnosis not present

## 2020-01-31 DIAGNOSIS — E1122 Type 2 diabetes mellitus with diabetic chronic kidney disease: Secondary | ICD-10-CM

## 2020-01-31 DIAGNOSIS — N186 End stage renal disease: Secondary | ICD-10-CM

## 2020-01-31 DIAGNOSIS — Z992 Dependence on renal dialysis: Secondary | ICD-10-CM

## 2020-01-31 MED ORDER — MUPIROCIN 2 % EX OINT: 1.0000 "application " | TOPICAL_OINTMENT | Freq: Three times a day (TID) | CUTANEOUS | 0 refills | Status: DC

## 2020-01-31 MED ORDER — MICONAZOLE NITRATE 2 % EX CREA: 1.0000 "application " | TOPICAL_CREAM | Freq: Two times a day (BID) | CUTANEOUS | 0 refills | Status: DC

## 2020-01-31 NOTE — Discharge Instructions (Addendum)
Please using miconazole twice daily for the next 2 weeks.  This is an antifungal medication.  Also would like for you to try mupirocin Bactroban ointment to cover for bacterial infection.  This is 3 times daily.  If your symptoms worsen over the next 2 days, report to the emergency room as you may need more intensive treatment then we could provide in the urgent care setting.

## 2020-01-31 NOTE — ED Provider Notes (Addendum)
[place]   MRN: [id number] DOB: [birth year]  Subjective:   Daniel [name] is a 59 y.o. male with PMH of CKD stage V, diabetes presenting for 2 week hx of persistent now painful skin tear of the head of the penis. Patient states that his sx started randomly, noticed he woke up like that. Denies being sexually active. Has not tried any oral medications. Has dialysis 3 times weekly, MWF. Patient denies being sexually active. He is not circumcised.   No current facility-administered medications for this encounter.  Current Outpatient Medications:  .  amLODipine (NORVASC) 10 MG tablet, Take 1 tablet (10 mg total) by mouth daily., Disp: 90 tablet, Rfl: 1 .  blood glucose meter kit and supplies KIT, Dispense based on patient and insurance preference. Use up to four times daily as directed. (FOR ICD-9 250.00, 250.01)., Disp: 1 each, Rfl: 0 .  chlorthalidone (HYGROTON) 50 MG tablet, Take 50 mg by mouth daily., Disp: , Rfl:  .  cloNIDine (CATAPRES) 0.2 MG tablet, Take  1 tablet twice daily . If makes drowsy take 2 tablets - after he is off of work (Patient taking differently: Take 0.2 mg by mouth See admin instructions. Take 1 tablet (0.2 mg) by mouth twice daily, if it makes you drowsy take 2 tablets (0.4 mg) after work), Disp: 180 tablet, Rfl: 1 .  ferrous sulfate 325 (65 FE) MG tablet, SMARTSIG:1 Tablet(s) By Mouth Twice Daily, Disp: , Rfl:  .  furosemide (LASIX) 40 MG tablet, Take 1 tablet (40 mg total) by mouth daily., Disp: 30 tablet, Rfl: 0 .  gabapentin (NEURONTIN) 300 MG capsule, Take 1 capsule (300 mg total) by mouth See admin instructions. Take 326m twice daily and 6033mat bedtime., Disp: 90 capsule, Rfl: 2 .  hydrOXYzine (ATARAX/VISTARIL) 25 MG tablet, Take 1 tablet (25 mg total) by mouth 2 (two) times daily as needed., Disp: 120 tablet, Rfl: 1 .  Investigational omega-3-fatty acid/placebo capsule [id number] 3 capsules by mouth 2 (two) times daily. Take with food.,  Disp: 540 capsule, Rfl: 0 .  oxyCODONE (ROXICODONE) 5 MG immediate release tablet, Take 1 tablet (5 mg total) by mouth every 4 (four) hours as needed., Disp: 12 tablet, Rfl: 0 .  oxyCODONE-acetaminophen (PERCOCET) 5-325 MG tablet, Take 1 tablet by mouth every 6 (six) hours as needed for severe pain., Disp: 8 tablet, Rfl: 0   No Known Allergies  Past Medical History:  Diagnosis Date  . Diabetes mellitus without complication ([facility]  . End stage chronic kidney disease ([facility] [place]  . Hypertension   . ICH (intracerebral hemorrhage) ([facility]  . Shoulder pain, left 06/28/2013     Past Surgical History:  Procedure Laterality Date  . APPENDECTOMY    . AV FISTULA PLACEMENT Left 08/03/2019   Procedure: LEFT ARM ARTERIOVENOUS (AV) CEPHALIC  FISTULA CREATION;  Surgeon: CaWaynetta SandyMD;  [facility];  Laterality: Left;  . BIOPSY  06/30/2019   Procedure: BIOPSY;  Surgeon: BuRonald LoboMD;  [facility]: Endoscopy;;  . BIOPSY  08/02/2019   Procedure: BIOPSY;  Surgeon: ArYetta FlockMD;  [facility] Service: Gastroenterology;;  . COLONOSCOPY  01/23/2012   Procedure: COLONOSCOPY;  Surgeon: SaDanie BinderMD;  [facility];  Service: Endoscopy;  Laterality: N/A;  11:10 AM  . COLONOSCOPY WITH PROPOFOL N/A 06/30/2019   Procedure: COLONOSCOPY WITH PROPOFOL;  Surgeon: BuRonald LoboMD;  [facility];  Service: Endoscopy;  Laterality: N/A;  . ENTEROSCOPY N/A 08/02/2019   Procedure: ENTEROSCOPY;  Surgeon: Yetta Flock, MD;  [facility] END[facility];  Service: Gastroenterology;  Laterality: N/A;  . ESOPHAGOGASTRODUODENOSCOPY (EGD) WITH PROPOFOL N/A 06/30/2019   Procedure: ESOPHAGOGASTRODUODENOSCOPY (EGD) WITH PROPOFOL;  Surgeon: Ronald Lobo, MD;  Location: [place];  Service: Endoscopy;  Laterality: N/A;  . FISTULA SUPERFICIALIZATION Left 10/17/2019   Procedure: LEFT UPPER EXTREMITY FISTULA REVISION,  SIDE BRANCH LIGATION,  AND SUPERFICIALIZATION;  Surgeon: Marty Heck, MD;  Location: [place];  Service: Vascular;  Laterality: Left;  . GIVENS CAPSULE STUDY N/A 06/30/2019   Procedure: GIVENS CAPSULE STUDY;  Surgeon: Ronald Lobo, MD;  Location: [facility] ENDOSCOPY;  Service: Endoscopy;  Laterality: N/A;  . HEMOSTASIS CONTROL  08/02/2019   Procedure: HEMOSTASIS CONTROL;  Surgeon: Yetta Flock, MD;  Location: [place] ENDOSCOPY;  Service: Gastroenterology;;  . INCISION AND DRAINAGE ABSCESS N/A 06/29/2016   Procedure: INCISION AND DRAINAGE ABDOMINAL WALL ABSCESS;  Surgeon: Alphonsa Overall, MD;  [facility];  Service: General;  Laterality: N/A;  . INSERTION OF DIALYSIS CATHETER Right 08/03/2019   Procedure: INSERTION OF DIALYSIS CATHETER;  Surgeon: Waynetta Sandy, MD;  Location: [place];  Service: Vascular;  Laterality: Right;  . INSERTION OF DIALYSIS CATHETER Right 10/22/2019   Procedure: INSERTION OF 23CM TUNNELED DIALYSIS CATHETER RIGHT INTERNAL JUGULAR;  Surgeon: Angelia Mould, MD;  Location: [place];  Service: Vascular;  Laterality: Right;  . Left heel surgery      Family History  Problem Relation Age of Onset  . Colon cancer Neg Hx     Social History   Tobacco Use  . Smoking status: Current Every Day Smoker    Packs/day: 1.00    Years: 30.00    Pack years: 30.00    Types: Cigarettes  . Smokeless tobacco: Never Used  Substance Use Topics  . Alcohol use: Yes    Alcohol/week: 12.0 standard drinks    Types: 12 Cans of beer per week  . Drug use: Yes    Types: "Crack" cocaine    Comment: last in 2020    ROS   Objective:   Vitals: BP (!) 144/84 (BP Location: Right Arm)   Pulse 91   Temp 99.1 F (37.3 C) (Oral)   Resp 20   SpO2 98%   Physical Exam Constitutional:      General: He is not in acute distress.    Appearance: Normal appearance. He is well-developed and normal weight. He is not ill-appearing, toxic-appearing or diaphoretic.  HENT:     Head:  Normocephalic and atraumatic.     Right Ear: External ear normal.     Left Ear: External ear normal.     Nose: Nose normal.     Mouth/Throat:     Pharynx: Oropharynx is clear.  Eyes:     General: No scleral icterus.       Right eye: No discharge.        Left eye: No discharge.     Extraocular Movements: Extraocular movements intact.     Conjunctiva/sclera: Conjunctivae normal.     Pupils: Pupils are equal, round, and reactive to light.  Cardiovascular:     Rate and Rhythm: Normal rate.  Pulmonary:     Effort: Pulmonary effort is normal.  Genitourinary:   Musculoskeletal:     Cervical back: Normal range of motion.  Skin:    General: Skin is warm and dry.  Neurological:     Mental  Status: He is alert and oriented to person, place, and time.  Psychiatric:        Mood and Affect: Mood normal.        Behavior: Behavior normal.        Thought Content: Thought content normal.        Judgment: Judgment normal.              Assessment and Plan :   PDMP not reviewed this encounter.  1. Balanitis   2. Penile pain   3. ESRD on dialysis ([place])   4. Controlled type 2 diabetes mellitus with stage 5 chronic kidney disease not on chronic dialysis, unspecified whether long term insulin use ([place])     Will cover for balanitis and superficial skin infection with miconazole cream and Bactroban.  Discussed case with Dr. Meda Coffee, considered chancroid, necrosis but will use a trial of after mentioned therapies.  Strict ER precautions. Counseled patient on potential for adverse effects with medications prescribed today, patient verbalized understanding.     Jaynee Eagles, [place] 01/31/20 [id number]

## 2020-01-31 NOTE — ED Triage Notes (Signed)
Pt states he has area on penis where the skin ha come off about 2 weeks ago that is painful.

## 2020-02-09 ENCOUNTER — Encounter (HOSPITAL_COMMUNITY): Payer: Self-pay | Admitting: *Deleted

## 2020-02-09 ENCOUNTER — Other Ambulatory Visit: Payer: Self-pay

## 2020-02-09 ENCOUNTER — Ambulatory Visit (HOSPITAL_COMMUNITY)
Admission: EM | Admit: 2020-02-09 | Discharge: 2020-02-09 | Disposition: A | Discharge status: Home / Self Care | Payer: Medicare Other | Attending: Family Medicine | Admitting: Family Medicine

## 2020-02-09 DIAGNOSIS — K921 Melena: Secondary | ICD-10-CM

## 2020-02-09 NOTE — ED Triage Notes (Signed)
Pt reports he finished dialysis at 1100. Pt has had 2 episodes of bloody stools since 11AM.. Pt reports feeling dizzy

## 2020-02-09 NOTE — Discharge Instructions (Addendum)
I feel your black stools are likely to taking Pepto Bismol yesterday. Nevertheless, you should call your primary care provider about having another colonoscopy.

## 2020-02-14 NOTE — ED Provider Notes (Signed)
[place]   [id number] 02/09/20 Arrival Time: 2542  ASSESSMENT & PLAN:  1. Black stools     No obvious abnormalities/masses/BRB on rectal exam. Did take Pepto Bismol yesterday. This could blacken stools but, nevertheless, he should have a colonoscopy. Discussed. He will talke with his PCP re: referral. ED if abrupt worsening or if he develops abdominal pain.    Reviewed expectations re: course of current medical issues. Questions answered. Outlined signs and symptoms indicating need for more acute intervention. Patient verbalized understanding. After Visit Summary given.   SUBJECTIVE: History from: patient.  Daniel [name] is a 59 y.o. male who reports "black stools" noted today x 2. No foul smell. No h/o GI bleed. No BRB. Afebrile. No abd pain. Did take Pepto Bismol yesterday for upset stomach. Mild lightheadedness after dialysis today; few minutes; has resolved.  Past Surgical History:  Procedure Laterality Date  . APPENDECTOMY    . AV FISTULA PLACEMENT Left 08/03/2019   Procedure: LEFT ARM ARTERIOVENOUS (AV) CEPHALIC  FISTULA CREATION;  Surgeon: Waynetta Sandy, MD;  Location: [place];  Service: Vascular;  Laterality: Left;  . BIOPSY  06/30/2019   Procedure: BIOPSY;  Surgeon: Ronald Lobo, MD;  Location: [place];  Service: Endoscopy;;  . BIOPSY  08/02/2019   Procedure: BIOPSY;  Surgeon: Yetta Flock, MD;  Location: [place];  Service: Gastroenterology;;  . COLONOSCOPY  01/23/2012   Procedure: COLONOSCOPY;  Surgeon: Danie Binder, MD;  [facility];  Service: Endoscopy;  Laterality: N/A;  11:10 AM  . COLONOSCOPY WITH PROPOFOL N/A 06/30/2019   Procedure: COLONOSCOPY WITH PROPOFOL;  Surgeon: Ronald Lobo, MD;  Location: [place];  Service: Endoscopy;  Laterality: N/A;  . ENTEROSCOPY N/A 08/02/2019   Procedure: ENTEROSCOPY;  Surgeon: Yetta Flock, MD;  Location[facility] ENDOSCOPY;  Service: Gastroenterology;  Laterality:  N/A;  . ESOPHAGOGASTRODUODENOSCOPY (EGD) WITH PROPOFOL N/A 06/30/2019   Procedure: ESOPHAGOGASTRODUODENOSCOPY (EGD) WITH PROPOFOL;  Surgeon: Ronald Lobo, MD;  Location: [place];  Service: Endoscopy;  Laterality: N/A;  . FISTULA SUPERFICIALIZATION Left 10/17/2019   Procedure: LEFT UPPER EXTREMITY FISTULA REVISION, SIDE BRANCH LIGATION,  AND SUPERFICIALIZATION;  Surgeon: Marty Heck, MD;  Location: [place];  Service: Vascular;  Laterality: Left;  . GIVENS CAPSULE STUDY N/A 06/30/2019   Procedure: GIVENS CAPSULE STUDY;  Surgeon: Ronald Lobo, MD;  Location: [facility] ENDOSCOPY;  Service: Endoscopy;  Laterality: N/A;  . HEMOSTASIS CONTROL  08/02/2019   Procedure: HEMOSTASIS CONTROL;  Surgeon: Yetta Flock, MD;  Location: [place] ENDOSCOPY;  Service: Gastroenterology;;  . INCISION AND DRAINAGE ABSCESS N/A 06/29/2016   Procedure: INCISION AND DRAINAGE ABDOMINAL WALL ABSCESS;  Surgeon: Alphonsa Overall, MD;  [facility];  Service: General;  Laterality: N/A;  . INSERTION OF DIALYSIS CATHETER Right 08/03/2019   Procedure: INSERTION OF DIALYSIS CATHETER;  Surgeon: Waynetta Sandy, MD;  Location: [place];  Service: Vascular;  Laterality: Right;  . INSERTION OF DIALYSIS CATHETER Right 10/22/2019   Procedure: INSERTION OF 23CM TUNNELED DIALYSIS CATHETER RIGHT INTERNAL JUGULAR;  Surgeon: Angelia Mould, MD;  Location: [place];  Service: Vascular;  Laterality: Right;  . Left heel surgery       OBJECTIVE:  Vitals:   02/09/20 1826 02/09/20 1829  BP: 135/69   Pulse: 90   Resp: 18   Temp: 98.8 F (37.1 C)   TempSrc: Oral   SpO2: 96%   Height:  5' 11.5" (1.816 m)    General appearance: alert; no distress Oropharynx: NAD;  no bleeding Heart: regular Abdomen: soft; non-distended; no significant abdominal tenderness; no masses or organomegaly; no guarding or rebound tenderness; normal rectal exam Back: no CVA tenderness Skin: warm; dry Neurologic: normal gait Psychological: alert and  cooperative; normal mood and affect   No Known Allergies                                             Past Medical History:  Diagnosis Date  . Diabetes mellitus without complication ([place])   . End stage chronic kidney disease ([place])   . Hypertension   . ICH (intracerebral hemorrhage) ([place]) 05/20/2017  . Shoulder pain, left 06/28/2013   Social History   Socioeconomic History  . Marital status: Single    Spouse name: Not on file  . Number of children: Not on file  . Years of education: Not on file  . Highest education level: Not on file  Occupational History  . Not on file  Tobacco Use  . Smoking status: Current Every Day Smoker    Packs/day: 1.00    Years: 30.00    Pack years: 30.00    Types: Cigarettes  . Smokeless tobacco: Never Used  Substance and Sexual Activity  . Alcohol use: Yes    Alcohol/week: 12.0 standard drinks    Types: 12 Cans of beer per week  . Drug use: Yes    Types: "Crack" cocaine    Comment: last in 2020  . Sexual activity: Yes    Birth control/protection: None  Other Topics Concern  . Not on file  Social History Narrative  . Not on file   Social Determinants of Health   Financial Resource Strain:   . Difficulty of Paying Living Expenses: Not on file  Food Insecurity:   . Worried About Charity fundraiser in the Last Year: Not on file  . Ran Out of Food in the Last Year: Not on file  Transportation Needs:   . Lack of Transportation (Medical): Not on file  . Lack of Transportation (Non-Medical): Not on file  Physical Activity:   . Days of Exercise per Week: Not on file  . Minutes of Exercise per Session: Not on file  Stress:   . Feeling of Stress : Not on file  Social Connections:   . Frequency of Communication with Friends and Family: Not on file  . Frequency of Social Gatherings with Friends and Family: Not on file  . Attends Religious Services: Not on file  . Active Member of Clubs or Organizations: Not on file  . Attends Theatre manager Meetings: Not on file  . Marital Status: Not on file  Intimate Partner Violence:   . Fear of Current or Ex-Partner: Not on file  . Emotionally Abused: Not on file  . Physically Abused: Not on file  . Sexually Abused: Not on file   Family History  Problem Relation Age of Onset  . Colon cancer Neg Hx      Vanessa Kick, MD 02/14/20 1006

## 2020-02-20 ENCOUNTER — Ambulatory Visit (HOSPITAL_COMMUNITY)
Admission: EM | Admit: 2020-02-20 | Discharge: 2020-02-20 | Disposition: A | Discharge status: Home / Self Care | Payer: Medicare Other | Attending: Internal Medicine | Admitting: Internal Medicine

## 2020-02-20 ENCOUNTER — Other Ambulatory Visit: Payer: Self-pay

## 2020-02-20 ENCOUNTER — Encounter (HOSPITAL_COMMUNITY): Payer: Self-pay | Admitting: Emergency Medicine

## 2020-02-20 DIAGNOSIS — L298 Other pruritus: Secondary | ICD-10-CM

## 2020-02-20 MED ORDER — MELATONIN 3 MG PO TABS
3.0000 mg | ORAL_TABLET | Freq: Every day | ORAL | 0 refills | Status: DC
Start: 2020-02-20 — End: 2020-02-23

## 2020-02-20 MED ORDER — CLARITIN 5 MG PO CHEW: 5.0000 mg | CHEWABLE_TABLET | Freq: Every day | ORAL | 0 refills | Status: DC

## 2020-02-20 NOTE — ED Triage Notes (Signed)
reports a rash all over his body for 2 weeks.  Rash itches.

## 2020-02-20 NOTE — Discharge Instructions (Addendum)
Please use mild body lotion to prevent dry skin.  Take medications as tolerated If symptoms persist please reach out to your nephrologist for further medication adjustment.

## 2020-02-21 NOTE — ED Provider Notes (Signed)
[facility]    CSN: [id number] Arrival date & time: 02/20/20  1524      History   Chief Complaint Chief Complaint  Patient presents with  . Rash    HPI Daniel [name] is a 59 y.o. male with a history of end-stage renal disease on hemodialysis comes to the urgent care with complaints of generalized itchy rash.  Patient started dialysis 5 months ago.  A few weeks after onset of dialysis patient started experiencing generalized itching.  He has several areas of excoriations without redness or discharge.  He has also seen hyperpigmented lesions all over his body.  Patient is currently on gabapentin and hydroxyzine.  No fever or chills.  HPI  Past Medical History:  Diagnosis Date  . Diabetes mellitus without complication ([place])   . End stage chronic kidney disease ([place])   . Hypertension   . ICH (intracerebral hemorrhage) ([place]) 05/20/2017  . Shoulder pain, left 06/28/2013    Patient Active Problem List   Diagnosis Date Noted  . ESRD (end stage renal disease) ([place])   . AVM (arteriovenous malformation) of small bowel, acquired with hemorrhage   . Symptomatic anemia 07/31/2019  . Acute on chronic anemia 07/23/2019  . Chronic kidney disease, stage V ([place])   . Chronic hepatitis C without hepatic coma ([place]) 07/11/2019  . Iron deficiency anemia 03/30/2019  . CKD (chronic kidney disease), stage V ([place]) 03/30/2019  . Alcohol use disorder 03/30/2019  . B12 deficiency 03/30/2019  . Orthostatic hypotension 01/22/2019  . Anemia   . Hypertension 06/29/2016  . DM2 (diabetes mellitus, type 2) ([place]) 06/29/2016    Past Surgical History:  Procedure Laterality Date  . APPENDECTOMY    . AV FISTULA PLACEMENT Left 08/03/2019   Procedure: LEFT ARM ARTERIOVENOUS (AV) CEPHALIC  FISTULA CREATION;  Surgeon: Waynetta Sandy, MD;  Location: [place];  Service: Vascular;  Laterality: Left;  . BIOPSY  06/30/2019   Procedure: BIOPSY;  Surgeon: Ronald Lobo, MD;  Location: [place];  Service: Endoscopy;;  . BIOPSY  08/02/2019   Procedure: BIOPSY;  Surgeon: Yetta Flock, MD;  Location: [place];  Service: Gastroenterology;;  . COLONOSCOPY  01/23/2012   Procedure: COLONOSCOPY;  Surgeon: Danie Binder, MD;  [facility];  Service: Endoscopy;  Laterality: N/A;  11:10 AM  . COLONOSCOPY WITH PROPOFOL N/A 06/30/2019   Procedure: COLONOSCOPY WITH PROPOFOL;  Surgeon: Ronald Lobo, MD;  Location: [place];  Service: Endoscopy;  Laterality: N/A;  . ENTEROSCOPY N/A 08/02/2019   Procedure: ENTEROSCOPY;  Surgeon: Yetta Flock, MD;  Location[facility];  Service: Gastroenterology;  Laterality: N/A;  . ESOPHAGOGASTRODUODENOSCOPY (EGD) WITH PROPOFOL N/A 06/30/2019   Procedure: ESOPHAGOGASTRODUODENOSCOPY (EGD) WITH PROPOFOL;  Surgeon: Ronald Lobo, MD;  Location: [place];  Service: Endoscopy;  Laterality: N/A;  . FISTULA SUPERFICIALIZATION Left 10/17/2019   Procedure: LEFT UPPER EXTREMITY FISTULA REVISION, SIDE BRANCH LIGATION,  AND SUPERFICIALIZATION;  Surgeon: Marty Heck, MD;  Location: [place];  Service: Vascular;  Laterality: Left;  . GIVENS CAPSULE STUDY N/A 06/30/2019   Procedure: GIVENS CAPSULE STUDY;  Surgeon: Ronald Lobo, MD;  Location: [facility] ENDOSCOPY;  Service: Endoscopy;  Laterality: N/A;  . HEMOSTASIS CONTROL  08/02/2019   Procedure: HEMOSTASIS CONTROL;  Surgeon: Yetta Flock, MD;  Location: [facility] ENDOSCOPY;  Service: Gastroenterology;;  . INCISION AND DRAINAGE ABSCESS N/A 06/29/2016   Procedure: INCISION AND DRAINAGE ABDOMINAL WALL ABSCESS;  Surgeon: Alphonsa Overall, MD;  Location: [facility];  Service: General;  Laterality: N/A;  . INSERTION OF DIALYSIS CATHETER Right 08/03/2019   Procedure: INSERTION OF DIALYSIS CATHETER;  Surgeon: Waynetta Sandy, MD;  Location: [place];  Service: Vascular;  Laterality: Right;  . INSERTION OF DIALYSIS CATHETER Right 10/22/2019   Procedure: INSERTION OF 23CM TUNNELED DIALYSIS CATHETER  RIGHT INTERNAL JUGULAR;  Surgeon: Angelia Mould, MD;  Location: [place];  Service: Vascular;  Laterality: Right;  . Left heel surgery         Home Medications    Prior to Admission medications   Medication Sig Start Date End Date Taking? Authorizing Provider  cloNIDine (CATAPRES) 0.2 MG tablet Take  1 tablet twice daily . If makes drowsy take 2 tablets - after he is off of work Patient taking differently: Take 0.2 mg by mouth See admin instructions. Take 1 tablet (0.2 mg) by mouth twice daily, if it makes you drowsy take 2 tablets (0.4 mg) after work 07/13/19  Yes [name], NP  gabapentin (NEURONTIN) 300 MG capsule Take 1 capsule (300 mg total) by mouth See admin instructions. Take 359m twice daily and 6081mat bedtime. 08/16/19  Yes EdKerin PernaNP  hydrOXYzine (ATARAX/VISTARIL) 25 MG tablet Take 1 tablet (25 mg total) by mouth 2 (two) times daily as needed. 12/28/19  Yes EdKerin PernaNP  blood glucose meter kit and supplies KIT Dispense based on patient and insurance preference. Use up to four times daily as directed. (FOR ICD-9 250.00, 250.01). 07/01/16   SaNita SellsMD  loratadine (CLARITIN) 5 MG chewable tablet Chew 1 tablet (5 mg total) by mouth daily. 02/20/20   [name], PhMyrene GalasMD  melatonin 3 MG TABS tablet Take 1 tablet (3 mg total) by mouth at bedtime. 02/20/20   [name], PhMyrene GalasMD  miconazole (MICOTIN) 2 % cream Apply 1 application topically 2 (two) times daily. 01/31/20   MaJaynee EaglesPA-C  mupirocin ointment (BACTROBAN) 2 % Apply 1 application topically 3 (three) times daily. 01/31/20   MaJaynee EaglesPA-C  amLODipine (NORVASC) 10 MG tablet Take 1 tablet (10 mg total) by mouth daily. 07/13/19 02/20/20  EdKerin PernaNP  ferrous sulfate 325 (65 FE) MG tablet SMARTSIG:1 Tablet(s) By Mouth Twice Daily 08/11/19 02/09/20  [provider]  furosemide (LASIX) 40 MG tablet Take 1 tablet (40 mg total) by mouth daily. 07/25/19 02/09/20   JoLadona HornsMD    Family History Family History  Problem Relation Age of Onset  . Colon cancer Neg Hx     Social History Social History   Tobacco Use  . Smoking status: Current Every Day Smoker    Packs/day: 1.00    Years: 30.00    Pack years: 30.00    Types: Cigarettes  . Smokeless tobacco: Never Used  Substance Use Topics  . Alcohol use: Yes    Alcohol/week: 12.0 standard drinks    Types: 12 Cans of beer per week  . Drug use: Yes    Types: "Crack" cocaine    Comment: last in 2020     Allergies   Patient has no known allergies.   Review of Systems Review of Systems  Respiratory: Negative.   Cardiovascular: Negative.   Genitourinary: Negative.   Musculoskeletal: Negative for arthralgias, gait problem, joint swelling and myalgias.  Skin: Positive for rash. Negative for color change, pallor and wound.  Neurological: Negative.      Physical Exam Triage Vital Signs ED Triage Vitals  Enc Vitals Group     BP  02/20/20 1705 (!) 153/91     Pulse Rate 02/20/20 1705 92     Resp 02/20/20 1705 (!) 22     Temp 02/20/20 1705 98.5 F (36.9 C)     Temp Source 02/20/20 1705 Oral     SpO2 02/20/20 1705 95 %     Weight --      Height --      Head Circumference --      Peak Flow --      Pain Score 02/20/20 1703 0     Pain Loc --      Pain Edu? --      Excl. in [place]? --    No data found.  Updated Vital Signs BP (!) 153/91 (BP Location: Right Arm)   Pulse 92   Temp 98.5 F (36.9 C) (Oral)   Resp (!) 22   SpO2 95%   Visual Acuity Right Eye Distance:   Left Eye Distance:   Bilateral Distance:    Right Eye Near:   Left Eye Near:    Bilateral Near:     Physical Exam Vitals and nursing note reviewed.  Constitutional:      General: He is not in acute distress.    Appearance: He is not ill-appearing.  Cardiovascular:     Rate and Rhythm: Normal rate and regular rhythm.  Pulmonary:     Effort: Pulmonary effort is normal.     Breath sounds: Normal breath  sounds.  Musculoskeletal:        General: Normal range of motion.     Cervical back: Normal range of motion.  Skin:    Comments: Hyperpigmented papular lesions over the upper extremities torso and lower extremities.  Excoriations over the upper extremities.  No erythema.  Neurological:     Mental Status: He is alert.      UC Treatments / Results  Labs (all labs ordered are listed, but only abnormal results are displayed) Labs Reviewed - No data to display  EKG   Radiology No results found.  Procedures Procedures (including critical care time)  Medications Ordered in UC Medications - No data to display  Initial Impression / Assessment and Plan / UC Course  I have reviewed the triage vital signs and the nursing notes.  Pertinent labs & imaging results that were available during my care of the patient were reviewed by me and considered in my medical decision making (see chart for details).     1.  Uremic pruritus: Patient is on gabapentin and hydroxyzine Patient skin is dry so I advised him to use body lotion very frequently to help with the dryness/itching I added Claritin to the treatment regimen Patient requested medication for sleep-I will start him on melatonin. Patient is encouraged to follow-up with his nephrologist since itching in the setting of hemodialysis is common and the patient will need his medications to be adjusted over time to control his symptoms. Final Clinical Impressions(s) / UC Diagnoses   Final diagnoses:  Uremic pruritus     Discharge Instructions     Please use mild body lotion to prevent dry skin.  Take medications as tolerated If symptoms persist please reach out to your nephrologist for further medication adjustment.    ED Prescriptions    Medication Sig Dispense Auth. Provider   melatonin 3 MG TABS tablet Take 1 tablet (3 mg total) by mouth at bedtime. 30 tablet Khrystian Schauf, Myrene Galas, MD   loratadine (CLARITIN) 5 MG chewable tablet  Chew 1  tablet (5 mg total) by mouth daily. 30 tablet Sri Clegg, Myrene Galas, MD     PDMP not reviewed this encounter.   Chase Picket, MD 02/21/20 1535

## 2020-02-23 ENCOUNTER — Encounter (HOSPITAL_COMMUNITY): Payer: Self-pay | Admitting: Emergency Medicine

## 2020-02-23 ENCOUNTER — Encounter (INDEPENDENT_AMBULATORY_CARE_PROVIDER_SITE_OTHER): Payer: Self-pay | Admitting: Primary Care

## 2020-02-23 ENCOUNTER — Ambulatory Visit (HOSPITAL_COMMUNITY)
Admission: EM | Admit: 2020-02-23 | Discharge: 2020-02-23 | Disposition: A | Discharge status: Home / Self Care | Payer: Medicare Other | Attending: Family Medicine | Admitting: Family Medicine

## 2020-02-23 ENCOUNTER — Other Ambulatory Visit: Payer: Self-pay

## 2020-02-23 ENCOUNTER — Ambulatory Visit: Payer: Medicare Other | Admitting: Nurse Practitioner

## 2020-02-23 ENCOUNTER — Ambulatory Visit (INDEPENDENT_AMBULATORY_CARE_PROVIDER_SITE_OTHER): Payer: Medicare Other | Admitting: Primary Care

## 2020-02-23 VITALS — BP 142/92 | HR 95 | Temp 97.5°F | Ht 71.0 in | Wt 252.6 lb

## 2020-02-23 DIAGNOSIS — N186 End stage renal disease: Secondary | ICD-10-CM | POA: Diagnosis not present

## 2020-02-23 DIAGNOSIS — L281 Prurigo nodularis: Secondary | ICD-10-CM

## 2020-02-23 DIAGNOSIS — L299 Pruritus, unspecified: Secondary | ICD-10-CM

## 2020-02-23 DIAGNOSIS — S3120XA Unspecified open wound of penis, initial encounter: Secondary | ICD-10-CM

## 2020-02-23 MED ORDER — GABAPENTIN 300 MG PO CAPS: 300.0000 mg | ORAL_CAPSULE | ORAL | 2 refills | Status: DC

## 2020-02-23 MED ORDER — CETIRIZINE HCL 10 MG PO TABS: 10.0000 mg | ORAL_TABLET | Freq: Every day | ORAL | 1 refills | Status: DC

## 2020-02-23 MED ORDER — MUPIROCIN 2 % EX OINT: 1.0000 "application " | TOPICAL_OINTMENT | Freq: Two times a day (BID) | CUTANEOUS | 0 refills | Status: DC

## 2020-02-23 MED ORDER — HYDROXYZINE HCL 25 MG PO TABS
25.0000 mg | ORAL_TABLET | Freq: Two times a day (BID) | ORAL | 1 refills | Status: DC | PRN
Start: 2020-02-23 — End: 2020-08-22

## 2020-02-23 MED ORDER — GABAPENTIN 300 MG PO CAPS: 300.0000 mg | ORAL_CAPSULE | Freq: Three times a day (TID) | ORAL | 2 refills | Status: DC

## 2020-02-23 NOTE — ED Provider Notes (Signed)
[facility]   [id number] 02/23/20 Arrival Time: 2376  ASSESSMENT & PLAN:  1. Open wound of penis, initial encounter     Meds ordered this encounter  Medications  . mupirocin ointment (BACTROBAN) 2 %    Sig: Apply 1 application topically 2 (two) times daily.    Dispense:  22 g    Refill:  0   No bleeding or drainage. Discussed that this will take some time to heal.  Recommend:  Follow-up Information    Schedule an appointment as soon as possible for a visit  with [facility].   Specialty: Wound Care Contact information: [place], [address] [id number] [place] [phone]              Reviewed expectations re: course of current medical issues. Questions answered. Outlined signs and symptoms indicating need for more acute intervention. Patient verbalized understanding. After Visit Summary given.   SUBJECTIVE:  Daniel [name] is a 59 y.o. male who reports catching the tip of his penis in his zipper; few weeks ago. Healing very slowly. Painful at times. No bleeding or drainage. Afebrile.  OBJECTIVE:  Vitals:   02/23/20 1747  BP: (!) 151/81  Resp: 19  Temp: 98.6 F (37 C)  TempSrc: Oral  SpO2: 96%     General appearance: alert, cooperative, appears stated age and no distress GU: inferior glans penis with approx 1 x 1 cm ulceration with eschar formation; no bleeding or drainage; is TTP Skin: warm and dry Psychological: alert and cooperative; normal mood and affect.  No Known Allergies  Past Medical History:  Diagnosis Date  . Diabetes mellitus without complication ([place])   . End stage chronic kidney disease ([place])   . Hypertension   . ICH (intracerebral hemorrhage) ([place]) 05/20/2017  . Shoulder pain, left 06/28/2013   Family History  Problem Relation Age of Onset  . Colon cancer Neg Hx    Social History   Socioeconomic History  . Marital status: Single    Spouse name: Not on  file  . Number of children: Not on file  . Years of education: Not on file  . Highest education level: Not on file  Occupational History  . Not on file  Tobacco Use  . Smoking status: Current Every Day Smoker    Packs/day: 1.00    Years: 30.00    Pack years: 30.00    Types: Cigarettes  . Smokeless tobacco: Never Used  Substance and Sexual Activity  . Alcohol use: Yes    Alcohol/week: 12.0 standard drinks    Types: 12 Cans of beer per week  . Drug use: Yes    Types: "Crack" cocaine    Comment: last in 2020  . Sexual activity: Yes    Birth control/protection: None  Other Topics Concern  . Not on file  Social History Narrative  . Not on file   Social Determinants of Health   Financial Resource Strain:   . Difficulty of Paying Living Expenses: Not on file  Food Insecurity:   . Worried About Charity fundraiser in the Last Year: Not on file  . Ran Out of Food in the Last Year: Not on file  Transportation Needs:   . Lack of Transportation (Medical): Not on file  . Lack of Transportation (Non-Medical): Not on file  Physical Activity:   . Days of Exercise per Week: Not on file  . Minutes of Exercise per Session: Not on  file  Stress:   . Feeling of Stress : Not on file  Social Connections:   . Frequency of Communication with Friends and Family: Not on file  . Frequency of Social Gatherings with Friends and Family: Not on file  . Attends Religious Services: Not on file  . Active Member of Clubs or Organizations: Not on file  . Attends Archivist Meetings: Not on file  . Marital Status: Not on file  Intimate Partner Violence:   . Fear of Current or Ex-Partner: Not on file  . Emotionally Abused: Not on file  . Physically Abused: Not on file  . Sexually Abused: Not on file          Vanessa Kick, MD 02/28/20 1118

## 2020-02-23 NOTE — ED Triage Notes (Signed)
Pt did not wish to discuss reason for visit with male.

## 2020-02-23 NOTE — Patient Instructions (Signed)
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 Pruritus is always severe and distressing; it can be paroxysmal, sporadic, or continuous and is worsened by heat, sweating, or irritation from clothing. (See 'Introduction' above and 'Clinical manifestations' above.) ?The diagnosis of PN is clinical, based upon a history of chronic, severe pruritus and the clinical finding of characteristic excoriated, nodular lesions symmetrically distributed. Cutaneous and systemic causes of chronic pruritus should be investigated. (See 'Diagnosis' above and "Pruritus: Etiology and patient evaluation", section on 'Potential causes'.) ?Treatment of PN is difficult and requires a multifaceted approach, involving patient education to adopt skin care measures to reduce skin irritation and scratching, symptomatic treatment of pruritus, and topical or systemic therapies aimed at interrupting the itch-scratch cycle and flattening the skin lesions. (See 'Management' above and 'Symptomatic control of pruritus' above.) ?For patients with a limited number of nodular lesions, we suggest superpotent topical corticosteroids (group 1 (table 2)) as first-line therapy (Grade 2C). Intralesional injection of corticosteroids may be an additional or alternative treatment modality for patients with very few lesions. Sedating antihistamines, such diphenhydramine, hydroxyzine, or others, are routinely used. (See 'Patients with limited disease' above.) ?For patients with widespread or recalcitrant disease, we suggest

## 2020-02-23 NOTE — Progress Notes (Signed)
Acute Office Visit  Subjective:    Patient ID: Daniel [name], male    DOB: 03/08/1961, 59 y.o.   MRN: [id number]  Chief Complaint  Patient presents with  . Rash    HPI Daniel [name] is a 59 year old obese male end-stage chronic disease on hemodialysis.  Presents today for a pruritus rash that covers his arms, trunk and back.  Ruled out bedbugs and scabies.  Past Medical History:  Diagnosis Date  . Diabetes mellitus without complication ([place])   . End stage chronic kidney disease ([place])   . Hypertension   . ICH (intracerebral hemorrhage) ([place]) 05/20/2017  . Shoulder pain, left 06/28/2013    Past Surgical History:  Procedure Laterality Date  . APPENDECTOMY    . AV FISTULA PLACEMENT Left 08/03/2019   Procedure: LEFT ARM ARTERIOVENOUS (AV) CEPHALIC  FISTULA CREATION;  Surgeon: Waynetta Sandy, MD;  Location: [place];  Service: Vascular;  Laterality: Left;  . BIOPSY  06/30/2019   Procedure: BIOPSY;  Surgeon: Ronald Lobo, MD;  Location: [place];  Service: Endoscopy;;  . BIOPSY  08/02/2019   Procedure: BIOPSY;  Surgeon: Yetta Flock, MD;  Location: [place];  Service: Gastroenterology;;  . COLONOSCOPY  01/23/2012   Procedure: COLONOSCOPY;  Surgeon: Danie Binder, MD;  [facility];  Service: Endoscopy;  Laterality: N/A;  11:10 AM  . COLONOSCOPY WITH PROPOFOL N/A 06/30/2019   Procedure: COLONOSCOPY WITH PROPOFOL;  Surgeon: Ronald Lobo, MD;  Location: [place];  Service: Endoscopy;  Laterality: N/A;  . ENTEROSCOPY N/A 08/02/2019   Procedure: ENTEROSCOPY;  Surgeon: Yetta Flock, MD;  Location: [facility] ENDOSCOPY;  Service: Gastroenterology;  Laterality: N/A;  . ESOPHAGOGASTRODUODENOSCOPY (EGD) WITH PROPOFOL N/A 06/30/2019   Procedure: ESOPHAGOGASTRODUODENOSCOPY (EGD) WITH PROPOFOL;  Surgeon: Ronald Lobo, MD;  Location: [place];  Service: Endoscopy;  Laterality: N/A;  . FISTULA SUPERFICIALIZATION Left 10/17/2019   Procedure: LEFT  UPPER EXTREMITY FISTULA REVISION, SIDE BRANCH LIGATION,  AND SUPERFICIALIZATION;  Surgeon: Marty Heck, MD;  Location: [place];  Service: Vascular;  Laterality: Left;  . GIVENS CAPSULE STUDY N/A 06/30/2019   Procedure: GIVENS CAPSULE STUDY;  Surgeon: Ronald Lobo, MD;  Location: [facility] ENDOSCOPY;  Service: Endoscopy;  Laterality: N/A;  . HEMOSTASIS CONTROL  08/02/2019   Procedure: HEMOSTASIS CONTROL;  Surgeon: Yetta Flock, MD;  Location: [place] ENDOSCOPY;  Service: Gastroenterology;;  . INCISION AND DRAINAGE ABSCESS N/A 06/29/2016   Procedure: INCISION AND DRAINAGE ABDOMINAL WALL ABSCESS;  Surgeon: Alphonsa Overall, MD;  [facility];  Service: General;  Laterality: N/A;  . INSERTION OF DIALYSIS CATHETER Right 08/03/2019   Procedure: INSERTION OF DIALYSIS CATHETER;  Surgeon: Waynetta Sandy, MD;  Location: [place];  Service: Vascular;  Laterality: Right;  . INSERTION OF DIALYSIS CATHETER Right 10/22/2019   Procedure: INSERTION OF 23CM TUNNELED DIALYSIS CATHETER RIGHT INTERNAL JUGULAR;  Surgeon: Angelia Mould, MD;  Location: [place];  Service: Vascular;  Laterality: Right;  . Left heel surgery      Family History  Problem Relation Age of Onset  . Colon cancer Neg Hx     Social History   Socioeconomic History  . Marital status: Single    Spouse name: Not on file  . Number of children: Not on file  . Years of education: Not on file  . Highest education level: Not on file  Occupational History  . Not on file  Tobacco Use  . Smoking status: Current Every Day Smoker    Packs/day:  1.00    Years: 30.00    Pack years: 30.00    Types: Cigarettes  . Smokeless tobacco: Never Used  Substance and Sexual Activity  . Alcohol use: Yes    Alcohol/week: 12.0 standard drinks    Types: 12 Cans of beer per week  . Drug use: Yes    Types: "Crack" cocaine    Comment: last in 2020  . Sexual activity: Yes    Birth control/protection: None  Other Topics Concern  . Not on file   Social History Narrative  . Not on file   Social Determinants of Health   Financial Resource Strain:   . Difficulty of Paying Living Expenses: Not on file  Food Insecurity:   . Worried About Charity fundraiser in the Last Year: Not on file  . Ran Out of Food in the Last Year: Not on file  Transportation Needs:   . Lack of Transportation (Medical): Not on file  . Lack of Transportation (Non-Medical): Not on file  Physical Activity:   . Days of Exercise per Week: Not on file  . Minutes of Exercise per Session: Not on file  Stress:   . Feeling of Stress : Not on file  Social Connections:   . Frequency of Communication with Friends and Family: Not on file  . Frequency of Social Gatherings with Friends and Family: Not on file  . Attends Religious Services: Not on file  . Active Member of Clubs or Organizations: Not on file  . Attends Archivist Meetings: Not on file  . Marital Status: Not on file  Intimate Partner Violence:   . Fear of Current or Ex-Partner: Not on file  . Emotionally Abused: Not on file  . Physically Abused: Not on file  . Sexually Abused: Not on file    Outpatient Medications Prior to Visit  Medication Sig Dispense Refill  . gabapentin (NEURONTIN) 300 MG capsule Take 1 capsule (300 mg total) by mouth See admin instructions. Take 324m twice daily and 6058mat bedtime. 90 capsule 2  . hydrOXYzine (ATARAX/VISTARIL) 25 MG tablet Take 1 tablet (25 mg total) by mouth 2 (two) times daily as needed. 120 tablet 1  . blood glucose meter kit and supplies KIT Dispense based on patient and insurance preference. Use up to four times daily as directed. (FOR ICD-9 250.00, 250.01). 1 each 0  . cloNIDine (CATAPRES) 0.2 MG tablet Take  1 tablet twice daily . If makes drowsy take 2 tablets - after Daniel [name] is off of work (Patient not taking: Reported on 02/23/2020) 180 tablet 1  . loratadine (CLARITIN) 5 MG chewable tablet Chew 1 tablet (5 mg total) by mouth daily. 30 tablet 0   . melatonin 3 MG TABS tablet Take 1 tablet (3 mg total) by mouth at bedtime. 30 tablet 0  . miconazole (MICOTIN) 2 % cream Apply 1 application topically 2 (two) times daily. 30 g 0  . mupirocin ointment (BACTROBAN) 2 % Apply 1 application topically 3 (three) times daily. 30 g 0   No facility-administered medications prior to visit.    No Known Allergies  Review of Systems  Skin: Positive for rash.  All other systems reviewed and are negative.      Objective:    Physical Exam Vitals reviewed.  Constitutional:      Appearance: Daniel [name] is obese.  HENT:     Head: Normocephalic.     Nose: Nose normal.  Cardiovascular:     Rate and Rhythm:  Normal rate and regular rhythm.  Pulmonary:     Effort: Pulmonary effort is normal.     Breath sounds: Normal breath sounds.  Abdominal:     General: Bowel sounds are normal. There is distension.     Palpations: Abdomen is soft.  Musculoskeletal:        General: Normal range of motion.     Cervical back: Normal range of motion and neck supple.     Comments: Shunt left arm positive for bruit and thrill  Skin:    Findings: Erythema, lesion and rash present.  Neurological:     Mental Status: Daniel [name] is alert and oriented to person, place, and time.  Psychiatric:        Mood and Affect: Mood normal.        Behavior: Behavior normal.        Thought Content: Thought content normal.        Judgment: Judgment normal.     BP (!) 142/92 (BP Location: Right Arm, Patient Position: Sitting, Cuff Size: Large)   Pulse 95   Temp (!) 97.5 F (36.4 C) (Temporal)   Ht 5' 11"  (1.803 m)   Wt 252 lb 9.6 oz (114.6 kg)   SpO2 98%   BMI 35.23 kg/m  Wt Readings from Last 3 Encounters:  02/23/20 252 lb 9.6 oz (114.6 kg)  12/28/19 259 lb (117.5 kg)  12/17/19 250 lb (113.4 kg)    Health Maintenance Due  Topic Date Due  . FOOT EXAM  Never done  . OPHTHALMOLOGY EXAM  Never done    There are no preventive care reminders to display for this patient.   Lab  Results  Component Value Date   TSH 2.833 06/29/2016   Lab Results  Component Value Date   WBC 5.4 12/17/2019   HGB 9.1 (L) 12/17/2019   HCT 28.4 (L) 12/17/2019   MCV 107.2 (H) 12/17/2019   PLT 141 (L) 12/17/2019   Lab Results  Component Value Date   NA 128 (L) 12/28/2019   K 3.3 (L) 12/28/2019   CO2 25 12/28/2019   GLUCOSE 85 12/28/2019   BUN 31 (H) 12/28/2019   CREATININE 8.15 (H) 12/28/2019   BILITOT 0.5 12/28/2019   ALKPHOS 52 12/28/2019   AST 75 (H) 12/28/2019   ALT 29 12/28/2019   PROT 8.4 12/28/2019   ALBUMIN 3.9 12/28/2019   CALCIUM 8.9 12/28/2019   ANIONGAP 18 (H) 12/17/2019   Lab Results  Component Value Date   CHOL 103 12/28/2019   Lab Results  Component Value Date   HDL 35 (L) 12/28/2019   Lab Results  Component Value Date   LDLCALC 32 12/28/2019   Lab Results  Component Value Date   TRIG 235 (H) 12/28/2019   Lab Results  Component Value Date   CHOLHDL 2.9 12/28/2019   Lab Results  Component Value Date   HGBA1C 5.2 12/28/2019       Assessment & Plan:  Daniel [name] was seen today for rash.  Diagnoses and all orders for this visit:  Prurigo nodularis Differential diagnosis included the above.  Consulted with nephrologist and discuss this diagnosis and likely treatment options.  This can be a common effect with dialysis.  Advised 40 mg of prednisone and stop.  Daniel [name] also agreed with my treatment of antihistamines and hydroxyzine  Pruritic dermatitis -     -     gabapentin (NEURONTIN) 300 MG capsule; Take 1 capsule (300 mg total) by mouth 3 (three) times daily. Take  342m twice daily and 6043mat bedtime.  ESRD (end stage renal disease) ([place] by nephrology on dialysis 3 days a week  Other orders -     hydrOXYzine (ATARAX/VISTARIL) 25 MG tablet; Take 1 tablet (25 mg total) by mouth 2 (two) times daily as needed. -     Discontinue: cetirizine (ZYRTEC ALLERGY) 10 MG tablet; Take 1 tablet (10 mg total) by mouth daily. -     cetirizine  (ZYRTEC ALLERGY) 10 MG tablet; Take 1 tablet (10 mg total) by mouth daily.  No orders of the defined types were placed in this encounter.  This visit included a discussion and consult with a nephrologist To discuss my findings and differential diagnosis. MiKerin PernaNP

## 2020-02-23 NOTE — Progress Notes (Deleted)
     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 Past appendectomy and AV fistula placement.   He presented to the urgent care clinic on 02/09/2020 with complaints of black stools.  He took Pepto bismol for an upset stomach.   EGD 06/30/2019 by Dr. Cristina Gong: - No source of iron deficiency anemia or heme positive stool endoscopically evident. - Normal larynx. - Normal esophagus. - Normal stomach. - Duodenal polyp vs. prominent papilla, benign-appearing. Biopsied.  Biopsy Report: A. DUODENUM, BIOPSY:  - Patchy chronic duodenitis with surface gastric foveolar metaplasia,  suggestive of peptic duodenitis   B. DUODENUM, BIOPSY:  - Patchy chronic duodenitis with surface gastric foveolar metaplasia,  suggestive of peptic duodenitis   Colonoscopy by Dr. Cristina Gong 06/30/2019: - No active bleeding or source of heme positive stool/anemia seen. - Diverticulosis in the sigmoid colon. - Probable old blood in the terminal ileum, raising the question of a small bowel source (given negative egd). - The distal rectum and anal verge are normal on retroflexion view. - No specimens collected.  Small bowel capsule endoscopy 07/08/2019: Normal study. No evidence of small bowel AVMs.  Small bowel enteroscopy 08/02/2019 by Dr. Havery Moros: - Esophagogastric landmarks identified. - Normal esophagus. - Normal stomach. - Prominent ampulla without overt adenomatous changes. Biopsied. - A few benign appearing duodenal polyps. Biopsied, one suspected AVM treated as above. 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 Treated with argon  plasma coagulation (APC). - A single non-bleeding angiodysplastic lesion in the proximal jejunum. Treated with argon plasma coagulation (APC). Small bowel AVMs could be the cause of the patient's anemia / symptoms, in the setting of advanced renal disease.   Current Medications, Allergies, Past Medical History, Past Surgical History, Family History and Social History were reviewed in [employer] record.   Review of Systems:   Constitutional: Negative for fever, sweats, chills or weight loss.  Respiratory: Negative for shortness of breath.   Cardiovascular: Negative for chest pain, palpitations and leg swelling.  Gastrointestinal: See HPI.  Musculoskeletal: Negative for back pain or muscle aches.  Neurological: Negative for dizziness, headaches or paresthesias.    Physical Exam: There were no vitals taken for this visit. General: Well developed, w   ***male in no acute distress. Head: Normocephalic and atraumatic. Eyes: No scleral icterus. Conjunctiva pink . Ears: Normal auditory acuity. Mouth: Dentition intact. No ulcers or lesions.  Lungs: Clear throughout to auscultation. Heart: Regular rate and rhythm, no murmur. Abdomen: Soft, nontender and nondistended. No masses or hepatomegaly. Normal bowel sounds x 4 quadrants.  Rectal: *** Musculoskeletal: Symmetrical with no gross deformities. Extremities: No edema. Neurological: Alert oriented x 4. No focal deficits.  Psychological: Alert and cooperative. Normal mood and affect  Assessment and Recommendations: ***

## 2020-03-05 ENCOUNTER — Ambulatory Visit: Payer: Medicare Other | Admitting: Podiatry

## 2020-03-05 ENCOUNTER — Other Ambulatory Visit: Payer: Self-pay

## 2020-03-05 ENCOUNTER — Encounter (HOSPITAL_COMMUNITY): Payer: Self-pay

## 2020-03-05 ENCOUNTER — Ambulatory Visit (HOSPITAL_COMMUNITY)
Admission: EM | Admit: 2020-03-05 | Discharge: 2020-03-05 | Disposition: A | Discharge status: Home / Self Care | Payer: Medicare Other | Attending: Internal Medicine | Admitting: Internal Medicine

## 2020-03-05 DIAGNOSIS — L298 Other pruritus: Secondary | ICD-10-CM | POA: Diagnosis not present

## 2020-03-05 DIAGNOSIS — N485 Ulcer of penis: Secondary | ICD-10-CM

## 2020-03-05 DIAGNOSIS — I10 Essential (primary) hypertension: Secondary | ICD-10-CM

## 2020-03-05 NOTE — Discharge Instructions (Signed)
Please take medications as prescribed Generalized itching is also a result of hemodialysis Continue current medications as prescribed The urologist wants you to call the office and make an appointment to be seen in the coming week.

## 2020-03-05 NOTE — ED Triage Notes (Signed)
Pt presents with rash on different areas of body that is not clearing up and sore on penis for past few weeks.Daniel [name]

## 2020-03-06 NOTE — ED Provider Notes (Signed)
[place]    CSN: [id number] Arrival date & time: 03/05/20  1539      History   Chief Complaint Chief Complaint  Patient presents with  . Rash    HPI Daniel [name] is a 59 y.o. 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  Patient has been on gabapentin, Vistaril and recently Zyrtec for the pruritus with minimal improvement in his symptoms.  No fever or chills.  Patient has shallow ulcer on the penis.  Ulcer is painful.  No surrounding erythema.  No fever or chills.  Patient continues to make some urine.  No urethral discharge.  No dysuria urgency or frequency.  Patient continues to have generalized pruritus with hyperpigmented nodular lesions all over his body.    HPI  Past Medical History:  Diagnosis Date  . Diabetes mellitus without complication ([place])   . End stage chronic kidney disease ([place])   . Hypertension   . ICH (intracerebral hemorrhage) ([place]) 05/20/2017  . Shoulder pain, left 06/28/2013    Patient Active Problem List   Diagnosis Date Noted  . ESRD (end stage renal disease) ([place])   . AVM (arteriovenous malformation) of small bowel, acquired with hemorrhage   . Symptomatic anemia 07/31/2019  . Acute on chronic anemia 07/23/2019  . Chronic kidney disease, stage V ([place])   . Chronic hepatitis C without hepatic coma ([place]) 07/11/2019  . Iron deficiency anemia 03/30/2019  . CKD (chronic kidney disease), stage V ([place]) 03/30/2019  . Alcohol use disorder 03/30/2019  . B12 deficiency 03/30/2019  . Orthostatic hypotension 01/22/2019  . Anemia   . Hypertension 06/29/2016  . DM2 (diabetes mellitus, type 2) ([place]) 06/29/2016    Past Surgical History:  Procedure Laterality Date  . APPENDECTOMY    . AV FISTULA PLACEMENT Left 08/03/2019   Procedure: LEFT ARM ARTERIOVENOUS (AV)  CEPHALIC  FISTULA CREATION;  Surgeon: Waynetta Sandy, MD;  Location: [place];  Service: Vascular;  Laterality: Left;  . BIOPSY  06/30/2019   Procedure: BIOPSY;  Surgeon: Ronald Lobo, MD;  Location: [place];  Service: Endoscopy;;  . BIOPSY  08/02/2019   Procedure: BIOPSY;  Surgeon: Yetta Flock, MD;  Location: [place];  Service: Gastroenterology;;  . COLONOSCOPY  01/23/2012   Procedure: COLONOSCOPY;  Surgeon: Danie Binder, MD;  [facility];  Service: Endoscopy;  Laterality: N/A;  11:10 AM  . COLONOSCOPY WITH PROPOFOL N/A 06/30/2019   Procedure: COLONOSCOPY WITH PROPOFOL;  Surgeon: Ronald Lobo, MD;  Location: [place];  Service: Endoscopy;  Laterality: N/A;  . ENTEROSCOPY N/A 08/02/2019   Procedure: ENTEROSCOPY;  Surgeon: Yetta Flock, MD;  Location: [facility] ENDOSCOPY;  Service: Gastroenterology;  Laterality: N/A;  . ESOPHAGOGASTRODUODENOSCOPY (EGD) WITH PROPOFOL N/A 06/30/2019   Procedure: ESOPHAGOGASTRODUODENOSCOPY (EGD) WITH PROPOFOL;  Surgeon: Ronald Lobo, MD;  Location: [place];  Service: Endoscopy;  Laterality: N/A;  . FISTULA SUPERFICIALIZATION Left 10/17/2019   Procedure: LEFT UPPER EXTREMITY FISTULA REVISION, SIDE BRANCH LIGATION,  AND SUPERFICIALIZATION;  Surgeon: Marty Heck, MD;  Location: [place];  Service: Vascular;  Laterality: Left;  . GIVENS CAPSULE STUDY N/A 06/30/2019   Procedure: GIVENS CAPSULE STUDY;  Surgeon: Ronald Lobo, MD;  Location[facility]COPY;  Service: Endoscopy;  Laterality: N/A;  . HEMOSTASIS CONTROL  08/02/2019   Procedure: HEMOSTASIS CONTROL;  Surgeon: [place] Cellar  P, MD;  Location: [place];  Service: Gastroenterology;;  . INCISION AND DRAINAGE ABSCESS N/A 06/29/2016   Procedure: INCISION AND DRAINAGE ABDOMINAL WALL ABSCESS;  Surgeon: Alphonsa Overall, MD;  [facility];  Service: General;  Laterality: N/A;  . INSERTION OF DIALYSIS CATHETER Right 08/03/2019   Procedure: INSERTION OF DIALYSIS  CATHETER;  Surgeon: Waynetta Sandy, MD;  Location: [place];  Service: Vascular;  Laterality: Right;  . INSERTION OF DIALYSIS CATHETER Right 10/22/2019   Procedure: INSERTION OF 23CM TUNNELED DIALYSIS CATHETER RIGHT INTERNAL JUGULAR;  Surgeon: Angelia Mould, MD;  Location: [place];  Service: Vascular;  Laterality: Right;  . Left heel surgery         Home Medications    Prior to Admission medications   Medication Sig Start Date End Date Taking? Authorizing Provider  blood glucose meter kit and supplies KIT Dispense based on patient and insurance preference. Use up to four times daily as directed. (FOR ICD-9 250.00, 250.01). 07/01/16   Nita Sells, MD  cetirizine (ZYRTEC ALLERGY) 10 MG tablet Take 1 tablet (10 mg total) by mouth daily. 02/23/20   [name], NP  cloNIDine (CATAPRES) 0.2 MG tablet Take  1 tablet twice daily . If makes drowsy take 2 tablets - after he is off of work Patient not taking: Reported on 02/23/2020 07/13/19   [name], NP  gabapentin (NEURONTIN) 300 MG capsule Take 1 capsule (300 mg total) by mouth 3 (three) times daily. Take 34m twice daily and 6036mat bedtime. 02/23/20   EdKerin PernaNP  hydrOXYzine (ATARAX/VISTARIL) 25 MG tablet Take 1 tablet (25 mg total) by mouth 2 (two) times daily as needed. 02/23/20   EdKerin PernaNP  mupirocin ointment (BACTROBAN) 2 % Apply 1 application topically 2 (two) times daily. 02/23/20   HaVanessa KickMD  amLODipine (NORVASC) 10 MG tablet Take 1 tablet (10 mg total) by mouth daily. 07/13/19 02/20/20  EdKerin PernaNP  ferrous sulfate 325 (65 FE) MG tablet SMARTSIG:1 Tablet(s) By Mouth Twice Daily 08/11/19 02/09/20  [provider]  furosemide (LASIX) 40 MG tablet Take 1 tablet (40 mg total) by mouth daily. 07/25/19 02/09/20  JoLadona HornsMD    Family History Family History  Problem Relation Age of Onset  . Colon cancer Neg Hx     Social History Social  History   Tobacco Use  . Smoking status: Current Every Day Smoker    Packs/day: 1.00    Years: 30.00    Pack years: 30.00    Types: Cigarettes  . Smokeless tobacco: Never Used  Substance Use Topics  . Alcohol use: Yes    Alcohol/week: 12.0 standard drinks    Types: 12 Cans of beer per week  . Drug use: Yes    Types: "Crack" cocaine    Comment: last in 2020     Allergies   Patient has no known allergies.   Review of Systems Review of Systems  Constitutional: Negative.   Respiratory: Negative.   Gastrointestinal: Negative.   Genitourinary: Positive for genital sores and penile pain. Negative for difficulty urinating, discharge, dysuria and frequency.  Musculoskeletal: Negative.   Skin: Positive for color change, rash and wound. Negative for pallor.  Neurological: Negative.      Physical Exam Triage Vital Signs ED Triage Vitals  Enc Vitals Group     BP 03/05/20 1639 (!) 152/75     Pulse Rate 03/05/20 1639 75     Resp 03/05/20  1639 17     Temp 03/05/20 1639 97.7 F (36.5 C)     Temp Source 03/05/20 1639 Oral     SpO2 03/05/20 1639 100 %     Weight --      Height --      Head Circumference --      Peak Flow --      Pain Score 03/05/20 1638 4     Pain Loc --      Pain Edu? --      Excl. in [place]? --    No data found.  Updated Vital Signs BP (!) 152/75 (BP Location: Right Arm)   Pulse 75   Temp 97.7 F (36.5 C) (Oral)   Resp 17   SpO2 100%   Visual Acuity Right Eye Distance:   Left Eye Distance:   Bilateral Distance:    Right Eye Near:   Left Eye Near:    Bilateral Near:     Physical Exam Constitutional:      Comments: Chronically ill looking  Cardiovascular:     Rate and Rhythm: Normal rate and regular rhythm.  Pulmonary:     Effort: Pulmonary effort is normal.     Breath sounds: Normal breath sounds. No wheezing, rhonchi or rales.  Genitourinary:    Comments: Circular penile ulcer on the head of the penis.  No surrounding erythema.  Ulcer  base has slough.  Penile meatus is normal. Musculoskeletal:        General: No swelling or signs of injury. Normal range of motion.  Skin:    General: Skin is warm.     Findings: Lesion and rash present. No bruising or erythema.     Comments: Hyperpigmented papular rash over the torso and upper extremities.  Occasional ulcerations noted.  Neurological:     General: No focal deficit present.     Mental Status: He is alert.      UC Treatments / Results  Labs (all labs ordered are listed, but only abnormal results are displayed) Labs Reviewed - No data to display  EKG   Radiology No results found.  Procedures Procedures (including critical care time)  Medications Ordered in UC Medications - No data to display  Initial Impression / Assessment and Plan / UC Course  I have reviewed the triage vital signs and the nursing notes.  Pertinent labs & imaging results that were available during my care of the patient were reviewed by me and considered in my medical decision making (see chart for details).     1.  Uremic pruritus: Gabapentin, hydroxyzine Patient advised to increase hydroxyzine dose from 25 to 50 twice daily Emollient application to help with the symptoms as patient skin is very dry  2.  Penile ulcer: Patient is advised to follow-up with the urologist.  Patient will call the urologist for an appointment next week.  I spoke with the urologist on-call Dr. Lovena Neighbours and he would like to see the patient in the office sometime next week.  Patient will call to make the appointment to be seen next week. Final Clinical Impressions(s) / UC Diagnoses   Final diagnoses:  Uremic pruritus  Penile ulcer     Discharge Instructions     Please take medications as prescribed Generalized itching is also a result of hemodialysis Continue current medications as prescribed The urologist wants you to call the office and make an appointment to be seen in the coming week.   ED  Prescriptions  None     PDMP not reviewed this encounter.   Chase Picket, MD 03/06/20 [phone]

## 2020-03-08 ENCOUNTER — Ambulatory Visit (INDEPENDENT_AMBULATORY_CARE_PROVIDER_SITE_OTHER): Payer: Self-pay | Admitting: Primary Care

## 2020-03-08 NOTE — Telephone Encounter (Signed)
Pt. Called to report severe pain in penile ulcer.  Was seen in ER on 11/8, and advised to contact [facility] for an appt. Next week.  Stated he called [facility], and was informed there are no openings until late December.  The pt. Reported pain is constant, severe.  Reported the penile ulcer is nickel-size, red and oozing a white discharge.  Denied fever/ chills, or swelling.  Reported very painful when underwear rubs against the ulcer, or when urinating.  Denied pain in groin or abdomen.    Call placed to [facility] on pt's behalf.  Spoke with Mickel Baas, Triage nurse.  Was advised that there are no available appts. To even try to work pt. Into.  Suggested to try another Urology office.  Called [facility]; spoke with [place] she will discuss with [name], and will try to find another Urology clinic for pt. To be seen ASAP.  Advised that someone from the office, will call pt. With plan.  Pt. Was advised of the above plan.  Advised to call back or go to the ER if fever/ chills and onset of bleeding from the ulcer site.  Verb. Understanding.  Reason for Disposition . SEVERE pain (e.g., excruciating)  Answer Assessment - Initial Assessment Questions 1. SYMPTOM: "What's the main symptom you're concerned about?" (e.g., discharge from penis, rash, pain, itching, swelling)     Sore on penis that is very painful 2. LOCATION: "Where is the sore located?"    Tip of penis  3. ONSET: "When did symptoms  start?"     About 2 weeks ago  4. PAIN: "Is there any pain?" If Yes, ask: "How bad is it?"  (Scale 1-10; or mild, moderate, severe)     Constant; it rubs against underwear 5. URINE: "Any difficulty passing urine?" If Yes, ask: "When was the last time?"     Difficulty urinating due to the amt. Of pain ; is also a dialysis pt.  6. CAUSE: "What do you think is causing the symptoms?"     Sore on penis 7. OTHER SYMPTOMS: "Do you have any other symptoms?" (e.g., fever,  abdominal pain, blood in urine)    nicklel - sized sore on tip of penis; red with white discharge. Denied fever/ chills, denies pain in abdomen or groin  Protocols used: PENIS AND SCROTUM [facility]

## 2020-03-13 ENCOUNTER — Encounter (INDEPENDENT_AMBULATORY_CARE_PROVIDER_SITE_OTHER): Payer: Self-pay | Admitting: Primary Care

## 2020-03-13 NOTE — Telephone Encounter (Signed)
error 

## 2020-03-21 ENCOUNTER — Other Ambulatory Visit: Payer: Self-pay

## 2020-03-21 ENCOUNTER — Encounter (HOSPITAL_COMMUNITY): Payer: Self-pay | Admitting: Urology

## 2020-03-21 ENCOUNTER — Other Ambulatory Visit: Payer: Self-pay | Admitting: Urology

## 2020-03-21 NOTE — Progress Notes (Addendum)
COVID Vaccine Completed: Yes Date COVID Vaccine completed: 08/29/19 COVID vaccine manufacturer: [employer]   PCP - [name] NP last office visit 02/23/20 in epic Cardiologist - N/A  Chest x-ray - 12/17/19 in epic EKG - 12/19/19 IN EPIC Stress Test - N/A ECHO - 01/23/2019 in epic Cardiac Cath - N/A Pacemaker/ICD device last checked:N/A  Sleep Study - N/A CPAP - N/A  Fasting Blood Sugar - N/A Checks Blood Sugar __N/A___ times a day  Blood Thinner Instructions: N/A Aspirin Instructions: N/A Last Dose:N/A  Activity level:  Can go up a flight of stairs without stopping and without symptoms       Anesthesia review: dialysis  Patient denies shortness of breath, fever, cough and chest pain at PAT appointment   Patient verbalized understanding of instructions that were given to them at the PAT appointment. Patient was also instructed that they will need to review over the PAT instructions again at home before surgery.

## 2020-03-26 ENCOUNTER — Ambulatory Visit (HOSPITAL_COMMUNITY): Payer: Medicare Other | Admitting: Physician Assistant

## 2020-03-26 ENCOUNTER — Other Ambulatory Visit: Payer: Self-pay

## 2020-03-26 ENCOUNTER — Ambulatory Visit (HOSPITAL_COMMUNITY)
Admission: RE | Admit: 2020-03-26 | Discharge: 2020-03-26 | Disposition: A | Discharge status: Home / Self Care | Payer: Medicare Other | Attending: Urology | Admitting: Urology

## 2020-03-26 ENCOUNTER — Encounter (HOSPITAL_COMMUNITY): Payer: Self-pay | Admitting: Urology

## 2020-03-26 ENCOUNTER — Encounter (HOSPITAL_COMMUNITY)
Admission: RE | Disposition: A | Discharge status: Home / Self Care | Payer: Self-pay | Source: Home / Self Care | Attending: Urology

## 2020-03-26 DIAGNOSIS — F17210 Nicotine dependence, cigarettes, uncomplicated: Secondary | ICD-10-CM | POA: Insufficient documentation

## 2020-03-26 DIAGNOSIS — N186 End stage renal disease: Secondary | ICD-10-CM | POA: Insufficient documentation

## 2020-03-26 DIAGNOSIS — E1122 Type 2 diabetes mellitus with diabetic chronic kidney disease: Secondary | ICD-10-CM | POA: Diagnosis not present

## 2020-03-26 DIAGNOSIS — Z79899 Other long term (current) drug therapy: Secondary | ICD-10-CM | POA: Insufficient documentation

## 2020-03-26 DIAGNOSIS — Z992 Dependence on renal dialysis: Secondary | ICD-10-CM | POA: Insufficient documentation

## 2020-03-26 DIAGNOSIS — Z20822 Contact with and (suspected) exposure to covid-19: Secondary | ICD-10-CM | POA: Insufficient documentation

## 2020-03-26 HISTORY — PX: PENILE BIOPSY: SHX6013

## 2020-03-26 HISTORY — DX: Dependence on renal dialysis: Z99.2

## 2020-03-26 HISTORY — DX: Anemia, unspecified: D64.9

## 2020-03-26 LAB — BASIC METABOLIC PANEL
Anion gap: 18 — ABNORMAL HIGH (ref 5–15)
BUN: 64 mg/dL — ABNORMAL HIGH (ref 6–20)
CO2: 22 mmol/L (ref 22–32)
Calcium: 8.9 mg/dL (ref 8.9–10.3)
Chloride: 98 mmol/L (ref 98–111)
Creatinine, Ser: 8.94 mg/dL — ABNORMAL HIGH (ref 0.61–1.24)
GFR, Estimated: 6 mL/min — ABNORMAL LOW (ref >60)
Glucose, Bld: 89 mg/dL (ref 70–99)
Potassium: 4.4 mmol/L (ref 3.5–5.1)
Sodium: 138 mmol/L (ref 135–145)

## 2020-03-26 LAB — CBC
HCT: 31.5 % — ABNORMAL LOW (ref 39.0–52.0)
Hemoglobin: 10 g/dL — ABNORMAL LOW (ref 13.0–17.0)
MCH: 33.3 pg (ref 26.0–34.0)
MCHC: 31.7 g/dL (ref 30.0–36.0)
MCV: 105 fL — ABNORMAL HIGH (ref 80.0–100.0)
Platelets: 149 10*3/uL — ABNORMAL LOW (ref 150–400)
RBC: 3 MIL/uL — ABNORMAL LOW (ref 4.22–5.81)
RDW: 14.6 % (ref 11.5–15.5)
WBC: 7.6 10*3/uL (ref 4.0–10.5)
nRBC: 0.3 % — ABNORMAL HIGH (ref 0.0–0.2)

## 2020-03-26 LAB — HEMOGLOBIN A1C
Hgb A1c MFr Bld: 6.4 % — ABNORMAL HIGH (ref 4.8–5.6)
Mean Plasma Glucose: 136.98 mg/dL

## 2020-03-26 LAB — SARS CORONAVIRUS 2 BY RT PCR (HOSPITAL ORDER, PERFORMED IN ~~LOC~~ HOSPITAL LAB): SARS Coronavirus 2: NEGATIVE

## 2020-03-26 LAB — GLUCOSE, CAPILLARY: Glucose-Capillary: 87 mg/dL (ref 70–99)

## 2020-03-26 SURGERY — BIOPSY, PENIS
Anesthesia: General | Site: Penis

## 2020-03-26 MED ORDER — CEFAZOLIN SODIUM-DEXTROSE 2-4 GM/100ML-% IV SOLN
2.0000 g | INTRAVENOUS | Status: DC
  Filled 2020-03-26: qty 100

## 2020-03-26 MED ORDER — LABETALOL HCL 5 MG/ML IV SOLN
INTRAVENOUS | Status: DC | PRN
  Administered 2020-03-26: 2.5 mg via INTRAVENOUS

## 2020-03-26 MED ORDER — BUPIVACAINE HCL 0.25 % IJ SOLN
INTRAMUSCULAR | Status: AC
  Filled 2020-03-26: qty 1

## 2020-03-26 MED ORDER — ONDANSETRON HCL 4 MG/2ML IJ SOLN
INTRAMUSCULAR | Status: DC | PRN
  Administered 2020-03-26: 4 mg via INTRAVENOUS

## 2020-03-26 MED ORDER — CHLORHEXIDINE GLUCONATE 0.12 % MT SOLN
15.0000 mL | Freq: Once | OROMUCOSAL | Status: AC
  Administered 2020-03-26: 15 mL via OROMUCOSAL

## 2020-03-26 MED ORDER — PROPOFOL 10 MG/ML IV BOLUS
INTRAVENOUS | Status: DC | PRN
  Administered 2020-03-26: 20 mg via INTRAVENOUS
  Administered 2020-03-26: 150 mg via INTRAVENOUS

## 2020-03-26 MED ORDER — LABETALOL HCL 5 MG/ML IV SOLN
INTRAVENOUS | Status: AC
  Filled 2020-03-26: qty 4

## 2020-03-26 MED ORDER — ACETAMINOPHEN 10 MG/ML IV SOLN: 1000.0000 mg | Freq: Once | INTRAVENOUS | Status: DC | PRN

## 2020-03-26 MED ORDER — BUPIVACAINE HCL (PF) 0.5 % IJ SOLN
INTRAMUSCULAR | Status: DC | PRN
  Administered 2020-03-26: 10 mL

## 2020-03-26 MED ORDER — ONDANSETRON HCL 4 MG/2ML IJ SOLN
INTRAMUSCULAR | Status: AC
  Filled 2020-03-26: qty 2

## 2020-03-26 MED ORDER — ONDANSETRON HCL 4 MG/2ML IJ SOLN: 4.0000 mg | Freq: Once | INTRAMUSCULAR | Status: DC | PRN

## 2020-03-26 MED ORDER — SODIUM CHLORIDE 0.9 % IV SOLN: INTRAVENOUS | Status: DC

## 2020-03-26 MED ORDER — MIDAZOLAM HCL 2 MG/2ML IJ SOLN
INTRAMUSCULAR | Status: AC
  Filled 2020-03-26: qty 2

## 2020-03-26 MED ORDER — OXYCODONE-ACETAMINOPHEN 5-325 MG PO TABS
1.0000 | ORAL_TABLET | ORAL | 0 refills | Status: DC | PRN
Start: 2020-03-26 — End: 2020-07-29

## 2020-03-26 MED ORDER — LIDOCAINE HCL (PF) 2 % IJ SOLN
INTRAMUSCULAR | Status: AC
  Filled 2020-03-26: qty 5

## 2020-03-26 MED ORDER — FENTANYL CITRATE (PF) 100 MCG/2ML IJ SOLN: 25.0000 ug | INTRAMUSCULAR | Status: DC | PRN

## 2020-03-26 MED ORDER — LIDOCAINE HCL (CARDIAC) PF 50 MG/5ML IV SOSY
PREFILLED_SYRINGE | INTRAVENOUS | Status: DC | PRN
  Administered 2020-03-26 (×2): 50 mg via INTRAVENOUS

## 2020-03-26 MED ORDER — 0.9 % SODIUM CHLORIDE (POUR BTL) OPTIME
TOPICAL | Status: DC | PRN
  Administered 2020-03-26: 1000 mL

## 2020-03-26 MED ORDER — FENTANYL CITRATE (PF) 100 MCG/2ML IJ SOLN
INTRAMUSCULAR | Status: DC | PRN
  Administered 2020-03-26 (×3): 50 ug via INTRAVENOUS

## 2020-03-26 MED ORDER — LACTATED RINGERS IV SOLN: INTRAVENOUS | Status: DC

## 2020-03-26 MED ORDER — MIDAZOLAM HCL 5 MG/5ML IJ SOLN
INTRAMUSCULAR | Status: DC | PRN
  Administered 2020-03-26: .5 mg via INTRAVENOUS
  Administered 2020-03-26: 1 mg via INTRAVENOUS
  Administered 2020-03-26: .5 mg via INTRAVENOUS

## 2020-03-26 MED ORDER — PROPOFOL 10 MG/ML IV BOLUS
INTRAVENOUS | Status: AC
  Filled 2020-03-26: qty 20

## 2020-03-26 MED ORDER — ORAL CARE MOUTH RINSE: 15.0000 mL | Freq: Once | OROMUCOSAL | Status: AC

## 2020-03-26 MED ORDER — FENTANYL CITRATE (PF) 100 MCG/2ML IJ SOLN
INTRAMUSCULAR | Status: AC
  Filled 2020-03-26: qty 2

## 2020-03-26 MED ORDER — FENTANYL CITRATE (PF) 250 MCG/5ML IJ SOLN
INTRAMUSCULAR | Status: AC
  Filled 2020-03-26: qty 5

## 2020-03-26 MED ORDER — BUPIVACAINE HCL (PF) 0.5 % IJ SOLN
INTRAMUSCULAR | Status: AC
  Filled 2020-03-26: qty 30

## 2020-03-26 SURGICAL SUPPLY — 42 items
BAG DECANTER FOR FLEXI CONT (MISCELLANEOUS) IMPLANT
BLADE SURG 15 STRL LF DISP TIS (BLADE) ×1 IMPLANT
BLADE SURG 15 STRL SS (BLADE) ×2
BNDG COHESIVE 1X5 TAN STRL LF (GAUZE/BANDAGES/DRESSINGS) ×2 IMPLANT
BNDG CONFORM 2 STRL LF (GAUZE/BANDAGES/DRESSINGS) ×2 IMPLANT
CATH FOLEY 2WAY SLVR  5CC 18FR (CATHETERS)
CATH FOLEY 2WAY SLVR 5CC 18FR (CATHETERS) IMPLANT
COVER WAND RF STERILE (DRAPES) IMPLANT
DRAIN PENROSE 0.5X18 (DRAIN) IMPLANT
DRAPE LAPAROTOMY T 98X78 PEDS (DRAPES) ×2 IMPLANT
ELECT REM PT RETURN 15FT ADLT (MISCELLANEOUS) ×2 IMPLANT
GAUZE 4X4 16PLY RFD (DISPOSABLE) ×4 IMPLANT
GAUZE PETROLATUM 1 X8 (GAUZE/BANDAGES/DRESSINGS) IMPLANT
GAUZE SPONGE 2X2 8PLY STRL LF (GAUZE/BANDAGES/DRESSINGS) ×1 IMPLANT
GAUZE SPONGE 4X4 12PLY STRL (GAUZE/BANDAGES/DRESSINGS) IMPLANT
GAUZE XEROFORM 1X8 LF (GAUZE/BANDAGES/DRESSINGS) ×8 IMPLANT
GLOVE BIOGEL M STRL SZ7.5 (GLOVE) ×2 IMPLANT
GOWN STRL REUS W/TWL XL LVL3 (GOWN DISPOSABLE) ×2 IMPLANT
IV NS 1000ML (IV SOLUTION) ×2
IV NS 1000ML BAXH (IV SOLUTION) ×1 IMPLANT
KIT BASIN OR (CUSTOM PROCEDURE TRAY) ×2 IMPLANT
KIT TURNOVER KIT A (KITS) IMPLANT
NEEDLE HYPO 22GX1.5 SAFETY (NEEDLE) ×2 IMPLANT
NS IRRIG 1000ML POUR BTL (IV SOLUTION) IMPLANT
PACK GENERAL/GYN (CUSTOM PROCEDURE TRAY) ×2 IMPLANT
PENCIL SMOKE EVACUATOR (MISCELLANEOUS) IMPLANT
PLUG CATH AND CAP STER (CATHETERS) IMPLANT
SET CYSTO W/LG BORE CLAMP LF (SET/KITS/TRAYS/PACK) IMPLANT
SPONGE GAUZE 2X2 STER 10/PKG (GAUZE/BANDAGES/DRESSINGS) ×1
SUT CHROMIC 3 0 SH 27 (SUTURE) IMPLANT
SUT CHROMIC 4 0 SH 27 (SUTURE) ×2 IMPLANT
SUT PDS AB 3-0 SH 27 (SUTURE) IMPLANT
SUT SILK 2 0 (SUTURE)
SUT SILK 2-0 18XBRD TIE 12 (SUTURE) IMPLANT
SWAB COLLECTION DEVICE MRSA (MISCELLANEOUS) ×2 IMPLANT
SWAB CULTURE ESWAB REG 1ML (MISCELLANEOUS) ×2 IMPLANT
SYR 10ML LL (SYRINGE) ×2 IMPLANT
SYR 50ML LL SCALE MARK (SYRINGE) IMPLANT
SYR CONTROL 10ML LL (SYRINGE) ×2 IMPLANT
TOWEL OR 17X26 10 PK STRL BLUE (TOWEL DISPOSABLE) ×2 IMPLANT
TOWEL OR NON WOVEN STRL DISP B (DISPOSABLE) ×2 IMPLANT
WATER STERILE IRR 1000ML POUR (IV SOLUTION) IMPLANT

## 2020-03-26 NOTE — Transfer of Care (Signed)
Immediate Anesthesia Transfer of Care Note  Patient: Daniel [name]  Procedure(s) Performed: PENILE ULCER DEBRIDEMENT (N/A Penis)  Patient Location: PACU  Anesthesia Type:General  Level of Consciousness: awake, alert , oriented and patient cooperative  Airway & Oxygen Therapy: Patient Spontanous Breathing and Patient connected to face mask oxygen  Post-op Assessment: Report given to RN, Post -op Vital signs reviewed and stable and Patient moving all extremities X 4  Post vital signs: stable  Last Vitals:  Vitals Value Taken Time  BP 166/99 03/26/20 1411  Temp    Pulse 79 03/26/20 1415  Resp 17 03/26/20 1415  SpO2 100 % 03/26/20 1415  Vitals shown include unvalidated device data.  Last Pain:  Vitals:   03/26/20 1004  TempSrc: Oral  PainSc: 0-No pain      Patients Stated Pain Goal: 4 (32/02/33 [id number])  Complications: No complications documented.

## 2020-03-26 NOTE — Anesthesia Preprocedure Evaluation (Addendum)
Anesthesia Evaluation  Patient identified by MRN, date of birth, ID band Patient awake    Reviewed: Allergy & Precautions, NPO status , Patient's Chart, lab work & pertinent test results  Airway Mallampati: IV  TM Distance: >3 FB Neck ROM: Full    Dental  (+) Poor Dentition, Chipped,    Pulmonary Current SmokerPatient did not abstain from smoking.,    Pulmonary exam normal breath sounds clear to auscultation       Cardiovascular hypertension, Pt. on medications Normal cardiovascular exam Rhythm:Regular Rate:Normal  ECG: NSR, rate 79   Neuro/Psych PSYCHIATRIC DISORDERS ICH (intracerebral hemorrhage)    GI/Hepatic negative GI ROS, (+) Hepatitis -  Endo/Other  negative endocrine ROSdiabetes  Renal/GU ESRF and DialysisRenal disease     Musculoskeletal negative musculoskeletal ROS (+)   Abdominal (+) + obese,   Peds  Hematology  (+) anemia ,   Anesthesia Other Findings PENILE ULCER  Reproductive/Obstetrics                            Anesthesia Physical Anesthesia Plan  ASA: III  Anesthesia Plan: General   Post-op Pain Management:    Induction: Intravenous  PONV Risk Score and Plan: 1 and Ondansetron, Dexamethasone and Treatment may vary due to age or medical condition  Airway Management Planned: LMA  Additional Equipment:   Intra-op Plan:   Post-operative Plan: Extubation in OR  Informed Consent: I have reviewed the patients History and Physical, chart, labs and discussed the procedure including the risks, benefits and alternatives for the proposed anesthesia with the patient or authorized representative who has indicated his/her understanding and acceptance.     Dental advisory given  Plan Discussed with: CRNA  Anesthesia Plan Comments:         Anesthesia Quick Evaluation

## 2020-03-26 NOTE — H&P (Signed)
Patient is a 59 year old African American male who has end-stage renal disease on hemodialysis. He states 3 weeks ago began developing a "sore in to "on the tip of the penis and has progressed over the last 3 weeks such that is now exquisitely tender to touch. Denies any fever or history of trauma. No history of STD exposure.  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 Here to discuss dressing changes. He has a Xeroform dressing. We discussed how to apply the dressing on a twice a day basis none going to see him back middle of next week  -03/14/20-patient with end-stage renal disease and penile ulceration which likely represents calciphylaxis. He has been doing dressing changes with Xeroform to the area twice daily. Here for follow-up. Patient states the ulcer area still very tender but no longer has the bad odor.  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 Here for follow-up. He has continued withXeroform dressing changes twice daily. He states that he has noted some worsening odor from the ulcer area.     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 Has smoked since 02/27/1976. Smokes 1/2 pack per day.   Tobacco Use Assessment Completed: Used Tobacco in last 30 days? Light Drinker.  Does not drink caffeine.    REVIEW OF SYSTEMS:    GU Review Male:   Patient reports get up at night to urinate,  leakage of urine, and trouble starting your stream. Patient denies frequent urination, hard to postpone urination, burning/ pain with urination, stream starts and stops, erection problems, and penile pain.  Gastrointestinal (Upper):   Patient denies nausea, vomiting, and indigestion/ heartburn.  Gastrointestinal (Lower):   Patient denies diarrhea and constipation.  Constitutional:   Patient reports fatigue. Patient denies fever, night sweats, and weight loss.  Skin:   Patient denies skin rash/ lesion and itching.  Eyes:   Patient denies blurred vision and double vision.  Ears/ Nose/ Throat:   Patient denies sore throat and sinus problems.  Hematologic/Lymphatic:   Patient denies swollen glands and easy bruising.  Cardiovascular:   Patient denies leg swelling and chest pains.  Respiratory:   Patient denies cough and shortness of breath.  Endocrine:   Patient denies excessive thirst.  Musculoskeletal:   Patient denies back pain and joint pain.  Neurological:   Patient denies headaches and dizziness.  Psychologic:   Patient denies anxiety and depression.   Notes: on dialysis    VITAL SIGNS:      03/21/2020 10:04 AM  Weight 230 lb / 104.33 kg  Height 71 in / 180.34 cm  BP 148/80 mmHg  Pulse 92 /min  Temperature 98.7 F / 37.0 C  BMI 32.1 kg/m   GU PHYSICAL EXAMINATION:    Anus and Perineum: No hemorrhoids. No anal stenosis. No rectal fissure, no anal fissure. No edema, no dimple, no perineal tenderness, no anal tenderness.  Scrotum:  No lesions. No edema. No cysts. No warts.  Epididymides: Right: no spermatocele, no masses, no cysts, no tenderness, no induration, no enlargement. Left: no spermatocele, no masses, no cysts, no tenderness, no induration, no enlargement.  Testes: No tenderness, no swelling, no enlargement left testes. No tenderness, no swelling, no enlargement right testes. Normal location left testes. Normal location right testes. No mass, no cyst, no varicocele, no hydrocele  left testes. No mass, no cyst, no varicocele, no hydrocele right testes.  Urethral Meatus: Normal size. No lesion, no wart, no discharge, no polyp. Normal location.  Penis: Glandular ulcer has some fibrin S exited today does not seem to have increased in size.  Prostate: 40 gram or 2+ size. Left lobe normal consistency, right lobe normal consistency. Symmetrical lobes. No prostate nodule. Left lobe no tenderness, right lobe no tenderness.  Seminal Vesicles: Nonpalpable.  Sphincter Tone: Normal sphincter. No rectal tenderness. No rectal mass.    MULTI-SYSTEM PHYSICAL EXAMINATION:    Constitutional: Well-nourished. No physical deformities. Normally developed. Good grooming.  Neck: Neck symmetrical, not swollen. Normal tracheal position.  Respiratory: No labored breathing, no use of accessory muscles.   Cardiovascular: Normal temperature, normal extremity pulses, no swelling, no varicosities.  Lymphatic: No enlargement of neck, axillae, groin.  Skin: No paleness, no jaundice, no cyanosis. No lesion, no ulcer, no rash.  Neurologic / Psychiatric: Oriented to time, oriented to place, oriented to person. No depression, no anxiety, no agitation.  Eyes: Normal conjunctivae. Normal eyelids.  Ears, Nose, Mouth, and Throat: Left ear no scars, no lesions, no masses. Right ear no scars, no lesions, no masses. Nose no scars, no lesions, no masses. Normal hearing. Normal lips.  Musculoskeletal: Normal gait and station of head and neck.     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 As the ulcer  seems to have gotten some more exited to it and perhaps has a deeper ulcer I am going to recommend surgical debridement. Will schedule accordingly on 03/26/2020. Patient understands debridement could cause permanent damage to the penis with scarring and disfigurement. Sometimes ulcers could require partial penectomy.

## 2020-03-26 NOTE — Anesthesia Postprocedure Evaluation (Signed)
Anesthesia Post Note  Patient: Daniel [name]  Procedure(s) Performed: PENILE ULCER DEBRIDEMENT (N/A Penis)     Patient location during evaluation: PACU Anesthesia Type: General Level of consciousness: awake and alert Pain management: pain level controlled Vital Signs Assessment: post-procedure vital signs reviewed and stable Respiratory status: spontaneous breathing, nonlabored ventilation, respiratory function stable and patient connected to nasal cannula oxygen Cardiovascular status: blood pressure returned to baseline and stable Postop Assessment: no apparent nausea or vomiting Anesthetic complications: no   No complications documented.  Last Vitals:  Vitals:   03/26/20 1430 03/26/20 1445  BP: (!) 171/92 (!) 155/86  Pulse: 81 76  Resp: 18 11  Temp:  36.6 C  SpO2: 100% 97%    Last Pain:  Vitals:   03/26/20 1445  TempSrc:   PainSc: 0-No pain                 Eryc Bodey P Comfort Iversen

## 2020-03-26 NOTE — Interval H&P Note (Signed)
History and Physical Interval Note:  03/26/2020 10:02 AM  Daniel [name]  has presented today for surgery, with the diagnosis of PENILE ULCER.  The various methods of treatment have been discussed with the patient and family. After consideration of risks, benefits and other options for treatment, the patient has consented to  Procedure(s) with comments: [place] (N/A) - 30 MINS as a surgical intervention.  The patient's history has been reviewed, patient examined, no change in status, stable for surgery.  I have reviewed the patient's chart and labs.  Questions were answered to the patient's satisfaction.     Remi Haggard

## 2020-03-26 NOTE — Interval H&P Note (Signed)
History and Physical Interval Note:  03/26/2020 1:00 PM  Daniel [name]  has presented today for surgery, with the diagnosis of PENILE ULCER.  The various methods of treatment have been discussed with the patient and family. After consideration of risks, benefits and other options for treatment, the patient has consented to  Procedure(s) with comments: [place] (N/A) - 30 MINS as a surgical intervention.  The patient's history has been reviewed, patient examined, no change in status, stable for surgery.  I have reviewed the patient's chart and labs.  Questions were answered to the patient's satisfaction.     Remi Haggard

## 2020-03-26 NOTE — Discharge Instructions (Signed)
Remove gauze wrap in a.m. and start xeroform dressing changes twice daily

## 2020-03-26 NOTE — Op Note (Addendum)
Operative report  Preop diagnosis: Calciphylaxis involving penis Postop diagnosis: Same Procedure: Debridement of penile ulcer Surgeon: Milford Cage Anesthesia: General Estimated blood loss minimal Operative findings penile ulcer debrided down to bed of granulation tissue with some bleeding.  Tissue cultures were sent.  Gauze wrap with compression at termination of the case. Ulcer size was 2cm X 1.5cm.  Depth was into the subcutaneous tissue Complications none Operative note: After obtaining informed consent for the patient is taken to the major OR suite and placed under general anesthesia.  Placed in the supine position genitalia prepped and draped in usual sterile fashion.  Half percent Marcaine was used for penile block.  The penile ulcer with subsequent debrided utilizing a 15 blade down to bed of granulation tissue which seemed to bleed adequately.  There is another smaller ulcer on the left portion of the glans this was likewise debrided.  This was debrided into the corporal tissue which tended to ooze and I required a skin stitch to control the bleeding.  Both of the penile ulcers were inspected and again good hemostasis was noted.  A gauze wrap was placed with mild compression along with [place] to hold this in place.  Again good hemostasis was noted.  Procedure was terminated he was taken back to the recovery room in stable condition.  No immediate complication from the procedure.

## 2020-03-26 NOTE — Anesthesia Procedure Notes (Signed)
Procedure Name: LMA Insertion Date/Time: 03/26/2020 1:15 PM Performed by: Lissa Morales, CRNA Pre-anesthesia Checklist: Patient identified, Emergency Drugs available, Suction available and Patient being monitored Patient Re-evaluated:Patient Re-evaluated prior to induction Oxygen Delivery Method: Circle system utilized Preoxygenation: Pre-oxygenation with 100% oxygen Induction Type: IV induction LMA: LMA with gastric port inserted LMA Size: 5.0 Tube type: Oral Number of attempts: 1 Placement Confirmation: positive ETCO2 Tube secured with: Tape Dental Injury: Teeth and Oropharynx as per pre-operative assessment

## 2020-03-27 ENCOUNTER — Encounter (HOSPITAL_COMMUNITY): Payer: Self-pay | Admitting: Urology

## 2020-03-31 LAB — AEROBIC/ANAEROBIC CULTURE W GRAM STAIN (SURGICAL/DEEP WOUND)

## 2020-04-10 ENCOUNTER — Encounter (INDEPENDENT_AMBULATORY_CARE_PROVIDER_SITE_OTHER): Payer: Self-pay | Admitting: Primary Care

## 2020-04-10 ENCOUNTER — Other Ambulatory Visit: Payer: Self-pay

## 2020-04-10 ENCOUNTER — Ambulatory Visit (INDEPENDENT_AMBULATORY_CARE_PROVIDER_SITE_OTHER): Payer: Medicare Other | Admitting: Primary Care

## 2020-04-10 VITALS — BP 121/85 | HR 93 | Temp 97.5°F | Ht 71.0 in | Wt 261.6 lb

## 2020-04-10 DIAGNOSIS — L281 Prurigo nodularis: Secondary | ICD-10-CM | POA: Diagnosis not present

## 2020-04-10 DIAGNOSIS — K921 Melena: Secondary | ICD-10-CM

## 2020-04-10 NOTE — Progress Notes (Signed)
Acute Office Visit  Subjective:    Patient ID: Daniel [name], male    DOB: 1960/05/23, 59 y.o.   MRN: [id number]  Chief Complaint  Patient presents with  . Blood In Stools  . Rash    All over body     HPI [name] is a 59 year old obese male who presents with the same problem rash and itching all over explained and spoke with nephrology Prurigo nodularis unable to understands this can be a side effect from dialysis. None of the medication prescribed last visit helped.  Past Medical History:  Diagnosis Date  . Anemia   . Diabetes mellitus without complication ([place])    patient denies  . Dialysis patient ([place])   . End stage chronic kidney disease ([place])   . Hypertension   . ICH (intracerebral hemorrhage) ([place]) 05/20/2017  . Shoulder pain, left 06/28/2013    Past Surgical History:  Procedure Laterality Date  . APPENDECTOMY    . AV FISTULA PLACEMENT Left 08/03/2019   Procedure: LEFT ARM ARTERIOVENOUS (AV) CEPHALIC  FISTULA CREATION;  Surgeon: Waynetta Sandy, MD;  Location: [place];  Service: Vascular;  Laterality: Left;  . BIOPSY  06/30/2019   Procedure: BIOPSY;  Surgeon: Ronald Lobo, MD;  Location: [place];  Service: Endoscopy;;  . BIOPSY  08/02/2019   Procedure: BIOPSY;  Surgeon: Yetta Flock, MD;  Location: [place];  Service: Gastroenterology;;  . COLONOSCOPY  01/23/2012   Procedure: COLONOSCOPY;  Surgeon: Danie Binder, MD;  [facility];  Service: Endoscopy;  Laterality: N/A;  11:10 AM  . COLONOSCOPY WITH PROPOFOL N/A 06/30/2019   Procedure: COLONOSCOPY WITH PROPOFOL;  Surgeon: Ronald Lobo, MD;  Location: [place];  Service: Endoscopy;  Laterality: N/A;  . ENTEROSCOPY N/A 08/02/2019   Procedure: ENTEROSCOPY;  Surgeon: Yetta Flock, MD;  Location: [facility];  Service: Gastroenterology;  Laterality: N/A;  . ESOPHAGOGASTRODUODENOSCOPY (EGD) WITH PROPOFOL N/A 06/30/2019   Procedure: ESOPHAGOGASTRODUODENOSCOPY  (EGD) WITH PROPOFOL;  Surgeon: Ronald Lobo, MD;  Location: [place];  Service: Endoscopy;  Laterality: N/A;  . FISTULA SUPERFICIALIZATION Left 10/17/2019   Procedure: LEFT UPPER EXTREMITY FISTULA REVISION, SIDE BRANCH LIGATION,  AND SUPERFICIALIZATION;  Surgeon: Marty Heck, MD;  Location: [place];  Service: Vascular;  Laterality: Left;  . GIVENS CAPSULE STUDY N/A 06/30/2019   Procedure: GIVENS CAPSULE STUDY;  Surgeon: Ronald Lobo, MD;  Location: [facility] ENDOSCOPY;  Service: Endoscopy;  Laterality: N/A;  . HEMOSTASIS CONTROL  08/02/2019   Procedure: HEMOSTASIS CONTROL;  Surgeon: Yetta Flock, MD;  Location: [place] [facility];  Service: Gastroenterology;;  . INCISION AND DRAINAGE ABSCESS N/A 06/29/2016   Procedure: INCISION AND DRAINAGE ABDOMINAL WALL ABSCESS;  Surgeon: Alphonsa Overall, MD;  Location: [facility];  Service: General;  Laterality: N/A;  . INSERTION OF DIALYSIS CATHETER Right 08/03/2019   Procedure: INSERTION OF DIALYSIS CATHETER;  Surgeon: Waynetta Sandy, MD;  Location: [place];  Service: Vascular;  Laterality: Right;  . INSERTION OF DIALYSIS CATHETER Right 10/22/2019   Procedure: INSERTION OF 23CM TUNNELED DIALYSIS CATHETER RIGHT INTERNAL JUGULAR;  Surgeon: Angelia Mould, MD;  Location: [place];  Service: Vascular;  Laterality: Right;  . Left heel surgery    . PENILE BIOPSY N/A 03/26/2020   Procedure: PENILE ULCER DEBRIDEMENT;  Surgeon: Remi Haggard, MD;  Location: [facility];  Service: Urology;  Laterality: N/A;  30 MINS    Family History  Problem Relation Age of Onset  . Colon cancer  Neg Hx     Social History   Socioeconomic History  . Marital status: Single    Spouse name: Not on file  . Number of children: Not on file  . Years of education: Not on file  . Highest education level: Not on file  Occupational History  . Not on file  Tobacco Use  . Smoking status: Current Every Day Smoker    Packs/day: 1.00    Years: 30.00    Pack years: 30.00     Types: Cigarettes  . Smokeless tobacco: Never Used  Vaping Use  . Vaping Use: Never used  Substance and Sexual Activity  . Alcohol use: Not Currently    Alcohol/week: 12.0 standard drinks    Types: 12 Cans of beer per week  . Drug use: Not Currently    Types: "Crack" cocaine    Comment: last in 2020  . Sexual activity: Yes    Birth control/protection: None  Other Topics Concern  . Not on file  Social History Narrative  . Not on file   Social Determinants of Health   Financial Resource Strain: Not on file  Food Insecurity: Not on file  Transportation Needs: Not on file  Physical Activity: Not on file  Stress: Not on file  Social Connections: Not on file  Intimate Partner Violence: Not on file    Outpatient Medications Prior to Visit  Medication Sig Dispense Refill  . amLODipine (NORVASC) 10 MG tablet Take 10 mg by mouth daily.    . blood glucose meter kit and supplies KIT Dispense based on patient and insurance preference. Use up to four times daily as directed. (FOR ICD-9 250.00, 250.01). 1 each 0  . Calcium Acetate 667 MG TABS Take 667-1,334 mg by mouth See admin instructions. Take 667 mg in the morning and afternoon and 1,334 in the evening    . cetirizine (ZYRTEC ALLERGY) 10 MG tablet Take 1 tablet (10 mg total) by mouth daily. 90 tablet 1  . cloNIDine (CATAPRES) 0.2 MG tablet Take  1 tablet twice daily . If makes drowsy take 2 tablets - after he is off of work (Patient taking differently: Take 0.2 mg by mouth See admin instructions. If makes drowsy take 2 tablets - after he is off of work) 180 tablet 1  . gabapentin (NEURONTIN) 300 MG capsule Take 1 capsule (300 mg total) by mouth 3 (three) times daily. Take 376m twice daily and 6076mat bedtime. (Patient taking differently: Take 300-600 mg by mouth See admin instructions. Take 30062mwice daily and 600m25m bedtime.) 120 capsule 2  . hydrOXYzine (ATARAX/VISTARIL) 25 MG tablet Take 1 tablet (25 mg total) by mouth 2 (two)  times daily as needed. (Patient taking differently: Take 25 mg by mouth 2 (two) times daily as needed (Pain).) 120 tablet 1  . lidocaine-prilocaine (EMLA) cream Apply 1 application topically daily as needed.    . mupirocin ointment (BACTROBAN) 2 % Apply 1 application topically 2 (two) times daily. 22 g 0  . oxyCODONE-acetaminophen (PERCOCET) 5-325 MG tablet Take 1 tablet by mouth every 4 (four) hours as needed for severe pain. 20 tablet 0  . oxyCODONE-acetaminophen (PERCOCET/ROXICET) 5-325 MG tablet Take 1 tablet by mouth every 4 (four) hours as needed.    . triamcinolone (KENALOG) 0.1 % Apply 1 application topically 2 (two) times daily.     No facility-administered medications prior to visit.    No Known Allergies  Review of Systems  Skin: Positive for color change and  rash.  All other systems reviewed and are negative.      Objective:    Physical Exam Vitals reviewed.  Constitutional:      Appearance: He is obese.  HENT:     Head: Normocephalic.     Nose: Nose normal.  Cardiovascular:     Rate and Rhythm: Normal rate and regular rhythm.  Pulmonary:     Effort: Pulmonary effort is normal.     Breath sounds: Normal breath sounds.  Abdominal:     General: Bowel sounds are normal. There is distension.  Musculoskeletal:        General: Normal range of motion.     Cervical back: Normal range of motion and neck supple.  Skin:    General: Skin is warm and dry.  Neurological:     Mental Status: He is alert and oriented to person, place, and time.  Psychiatric:        Mood and Affect: Mood normal.        Behavior: Behavior normal.        Thought Content: Thought content normal.        Judgment: Judgment normal.     BP 121/85 (BP Location: Right Arm, Patient Position: Sitting, Cuff Size: Large)   Pulse 93   Temp (!) 97.5 F (36.4 C) (Temporal)   Ht _0  (1.803 m)   Wt 261 lb 9.6 oz (118.7 kg)   SpO2 94%   BMI 36.49 kg/m  Wt Readings from Last 3 Encounters:   04/10/20 261 lb 9.6 oz (118.7 kg)  03/26/20 269 lb 3.2 oz (122.1 kg)  02/23/20 252 lb 9.6 oz (114.6 kg)    Health Maintenance Due  Topic Date Due  . FOOT EXAM  Never done  . OPHTHALMOLOGY EXAM  Never done  . COVID-19 Vaccine (2 - Booster for Janssen series) 10/24/2019    There are no preventive care reminders to display for this patient.   Lab Results  Component Value Date   TSH 2.833 06/29/2016   Lab Results  Component Value Date   WBC 7.6 03/26/2020   HGB 10.0 (L) 03/26/2020   HCT 31.5 (L) 03/26/2020   MCV 105.0 (H) 03/26/2020   PLT 149 (L) 03/26/2020   Lab Results  Component Value Date   NA 138 03/26/2020   K 4.4 03/26/2020   CO2 22 03/26/2020   GLUCOSE 89 03/26/2020   BUN 64 (H) 03/26/2020   CREATININE 8.94 (H) 03/26/2020   BILITOT 0.5 12/28/2019   ALKPHOS 52 12/28/2019   AST 75 (H) 12/28/2019   ALT 29 12/28/2019   PROT 8.4 12/28/2019   ALBUMIN 3.9 12/28/2019   CALCIUM 8.9 03/26/2020   ANIONGAP 18 (H) 03/26/2020   Lab Results  Component Value Date   CHOL 103 12/28/2019   Lab Results  Component Value Date   HDL 35 (L) 12/28/2019   Lab Results  Component Value Date   LDLCALC 32 12/28/2019   Lab Results  Component Value Date   TRIG 235 (H) 12/28/2019   Lab Results  Component Value Date   CHOLHDL 2.9 12/28/2019   Lab Results  Component Value Date   HGBA1C 6.4 (H) 03/26/2020       Assessment & Plan:  Amauris was seen today for blood in stools and rash.  Diagnoses and all orders for this visit:  Prurigo nodularis -     Ambulatory referral to Dermatology  Hematochezia -     Ambulatory referral to Gastroenterology   Consulted  with Dr. Dellia Nims for additional treatment options  No orders of the defined types were placed in this encounter.    [name], NP

## 2020-04-10 NOTE — Patient Instructions (Addendum)
Pruritus Pruritus is an itchy feeling on the skin. One of the most common causes is dry skin, but many different things can cause itching. Most cases of itching do not require medical attention. Sometimes itchy skin can turn into a rash. Follow these instructions at home: Skin care   Apply moisturizing lotion to your skin as needed. Lotion that contains petroleum jelly is best.  Take medicines or apply medicated creams only as told by your health care provider. This may include: ? Corticosteroid cream. ? Anti-itch lotions. ? Oral antihistamines.  Apply a cool, wet cloth (cool compress) to the affected areas.  Take baths with one of the following: ? Epsom salts. You can get these at your local pharmacy or grocery store. Follow the instructions on the packaging. ? Baking soda. Pour a small amount into the bath as told by your health care provider. ? Colloidal oatmeal. You can get this at your local pharmacy or grocery store. Follow the instructions on the packaging.  Apply baking soda paste to your skin. To make the paste, stir water into a small amount of baking soda until it reaches a paste-like consistency.  Do not scratch your skin.  Do not take hot showers or baths, which can make itching worse. A cool shower may help with itching as long as you apply moisturizing lotion after the shower.  Do not use scented soaps, detergents, perfumes, and cosmetic products. Instead, use gentle, unscented versions of these items. General instructions  Avoid wearing tight clothes.  Keep a journal to help find out what is causing your itching. Write down: ? What you eat and drink. ? What cosmetic products you use. ? What soaps or detergents you use. ? What you wear, including jewelry.  Use a humidifier. This keeps the air moist, which helps to prevent dry skin.  Be aware of any changes in your itchiness. Contact a health care provider if:  The itching does not go away after several  days.  You are unusually thirsty or urinating more than normal.  Your skin tingles or feels numb.  Your skin or the white parts of your eyes turn yellow (jaundice).  You feel weak.  You have any of the following: ? Night sweats. ? Tiredness (fatigue). ? Weight loss. ? Abdominal pain. Summary  Pruritus is an itchy feeling on the skin. One of the most common causes is dry skin, but many different conditions and factors can cause itching.  Apply moisturizing lotion to your skin as needed. Lotion that contains petroleum jelly is best.  Take medicines or apply medicated creams only as told by your health care provider.  Do not take hot showers or baths. Do not use scented soaps, detergents, perfumes, or cosmetic products. This information is not intended to replace advice given to you by your health care provider. Make sure you discuss any questions you have with your health care provider. Document Revised: 04/28/2017 Document Reviewed: 04/28/2017 Elsevier Patient Education  2020 [employer]  

## 2020-04-29 ENCOUNTER — Other Ambulatory Visit: Payer: Self-pay

## 2020-04-29 ENCOUNTER — Emergency Department (HOSPITAL_COMMUNITY)
Admission: EM | Admit: 2020-04-29 | Discharge: 2020-04-30 | Disposition: A | Discharge status: Home / Self Care | Payer: Medicare Other | Attending: Emergency Medicine | Admitting: Emergency Medicine

## 2020-04-29 DIAGNOSIS — N485 Ulcer of penis: Secondary | ICD-10-CM | POA: Diagnosis not present

## 2020-04-29 DIAGNOSIS — E1122 Type 2 diabetes mellitus with diabetic chronic kidney disease: Secondary | ICD-10-CM | POA: Diagnosis not present

## 2020-04-29 DIAGNOSIS — L298 Other pruritus: Secondary | ICD-10-CM | POA: Diagnosis not present

## 2020-04-29 DIAGNOSIS — F17210 Nicotine dependence, cigarettes, uncomplicated: Secondary | ICD-10-CM | POA: Insufficient documentation

## 2020-04-29 DIAGNOSIS — Z992 Dependence on renal dialysis: Secondary | ICD-10-CM | POA: Insufficient documentation

## 2020-04-29 DIAGNOSIS — L281 Prurigo nodularis: Secondary | ICD-10-CM | POA: Diagnosis not present

## 2020-04-29 DIAGNOSIS — Z79899 Other long term (current) drug therapy: Secondary | ICD-10-CM | POA: Insufficient documentation

## 2020-04-29 DIAGNOSIS — R21 Rash and other nonspecific skin eruption: Secondary | ICD-10-CM | POA: Diagnosis present

## 2020-04-29 DIAGNOSIS — I120 Hypertensive chronic kidney disease with stage 5 chronic kidney disease or end stage renal disease: Secondary | ICD-10-CM | POA: Diagnosis not present

## 2020-04-29 DIAGNOSIS — N186 End stage renal disease: Secondary | ICD-10-CM

## 2020-04-29 NOTE — ED Triage Notes (Signed)
Pt c/o bumps all over body and sore on penis that wont go away x52m. Pt is dialysis pt t/th/s. Has not missed

## 2020-04-30 DIAGNOSIS — L281 Prurigo nodularis: Secondary | ICD-10-CM | POA: Diagnosis not present

## 2020-04-30 LAB — CBC WITH DIFFERENTIAL/PLATELET
Abs Immature Granulocytes: 0.08 10*3/uL — ABNORMAL HIGH (ref 0.00–0.07)
Basophils Absolute: 0 10*3/uL (ref 0.0–0.1)
Basophils Relative: 0 %
Eosinophils Absolute: 0.1 10*3/uL (ref 0.0–0.5)
Eosinophils Relative: 1 %
HCT: 32.1 % — ABNORMAL LOW (ref 39.0–52.0)
Hemoglobin: 10.1 g/dL — ABNORMAL LOW (ref 13.0–17.0)
Immature Granulocytes: 1 %
Lymphocytes Relative: 31 %
Lymphs Abs: 2.8 10*3/uL (ref 0.7–4.0)
MCH: 33.7 pg (ref 26.0–34.0)
MCHC: 31.5 g/dL (ref 30.0–36.0)
MCV: 107 fL — ABNORMAL HIGH (ref 80.0–100.0)
Monocytes Absolute: 1 10*3/uL (ref 0.1–1.0)
Monocytes Relative: 11 %
Neutro Abs: 5.1 10*3/uL (ref 1.7–7.7)
Neutrophils Relative %: 56 %
Platelets: 173 10*3/uL (ref 150–400)
RBC: 3 MIL/uL — ABNORMAL LOW (ref 4.22–5.81)
RDW: 15.5 % (ref 11.5–15.5)
WBC: 9.1 10*3/uL (ref 4.0–10.5)
nRBC: 0.9 % — ABNORMAL HIGH (ref 0.0–0.2)

## 2020-04-30 LAB — BASIC METABOLIC PANEL
Anion gap: 22 — ABNORMAL HIGH (ref 5–15)
BUN: 44 mg/dL — ABNORMAL HIGH (ref 6–20)
CO2: 22 mmol/L (ref 22–32)
Calcium: 8.9 mg/dL (ref 8.9–10.3)
Chloride: 90 mmol/L — ABNORMAL LOW (ref 98–111)
Creatinine, Ser: 8.18 mg/dL — ABNORMAL HIGH (ref 0.61–1.24)
GFR, Estimated: 7 mL/min — ABNORMAL LOW (ref >60)
Glucose, Bld: 107 mg/dL — ABNORMAL HIGH (ref 70–99)
Potassium: 4.2 mmol/L (ref 3.5–5.1)
Sodium: 134 mmol/L — ABNORMAL LOW (ref 135–145)

## 2020-04-30 MED ORDER — PRAMOXINE HCL 0.5 % EX OINT: 1.0000 "application " | TOPICAL_OINTMENT | Freq: Four times a day (QID) | CUTANEOUS | 0 refills | Status: DC

## 2020-04-30 MED ORDER — HYDROCODONE-ACETAMINOPHEN 5-325 MG PO TABS
1.0000 | ORAL_TABLET | Freq: Once | ORAL | Status: AC
  Administered 2020-04-30: 1 via ORAL
  Filled 2020-04-30: qty 1

## 2020-04-30 MED ORDER — SULFAMETHOXAZOLE-TRIMETHOPRIM 800-160 MG PO TABS
1.0000 | ORAL_TABLET | Freq: Once | ORAL | Status: AC
  Administered 2020-04-30: 1 via ORAL
  Filled 2020-04-30: qty 1

## 2020-04-30 MED ORDER — DIPHENHYDRAMINE HCL 50 MG/ML IJ SOLN
50.0000 mg | Freq: Once | INTRAMUSCULAR | Status: AC
  Administered 2020-04-30: 50 mg via INTRAMUSCULAR
  Filled 2020-04-30: qty 1

## 2020-04-30 MED ORDER — SULFAMETHOXAZOLE-TRIMETHOPRIM 400-80 MG PO TABS: 1.0000 | ORAL_TABLET | Freq: Two times a day (BID) | ORAL | 0 refills | Status: DC

## 2020-04-30 MED ORDER — MUPIROCIN CALCIUM 2 % EX CREA: 1.0000 "application " | TOPICAL_CREAM | Freq: Two times a day (BID) | CUTANEOUS | 0 refills | Status: DC

## 2020-04-30 NOTE — ED Provider Notes (Signed)
[facility] EMERGENCY DEPARTMENT Provider Note   CSN: [id number] Arrival date & time: 04/29/20  2052     History Chief Complaint  Patient presents with  . Rash    Daniel [name] is a 60 y.o. male.  Pt presents today with an itchy rash.  Pt tells me it's been going on for a few weeks.  However, on chart review, I see that he has been seen for it several times as far back as October.  Pt also c/o a painful bump on his penis. He is not sexually active.  He told me it had only been going on for about a month, but it looks like he's been seen for it several times as well.  It initially started with his penis getting stuck in a zipper which caused a skin tear.  He was seen starting in October.  He did f/u with urology.  Dr. Milford Cage did a debridement of the ulcer on 11/30.   Pt is a dialysis patient and goes Tues, Thurs, Sat.  He said he's not missed.  He still urinates, but it is very painful for him to urinate.  PT's chart said he is diabetic, however he said he is not and he is not on any diabetic meds.        Past Medical History:  Diagnosis Date  . Anemia   . Diabetes mellitus without complication ([place])    patient denies  . Dialysis patient ([place])   . End stage chronic kidney disease ([place])   . Hypertension   . ICH (intracerebral hemorrhage) ([place]) 05/20/2017  . Shoulder pain, left 06/28/2013    Patient Active Problem List   Diagnosis Date Noted  . ESRD (end stage renal disease) ([place])   . AVM (arteriovenous malformation) of small bowel, acquired with hemorrhage   . Symptomatic anemia 07/31/2019  . Acute on chronic anemia 07/23/2019  . Chronic kidney disease, stage V ([place])   . Chronic hepatitis C without hepatic coma ([place]) 07/11/2019  . Iron deficiency anemia 03/30/2019  . CKD (chronic kidney disease), stage V ([place]) 03/30/2019  . Alcohol use disorder 03/30/2019  . B12 deficiency 03/30/2019  . Orthostatic hypotension 01/22/2019  . Anemia   . Hypertension  06/29/2016  . DM2 (diabetes mellitus, type 2) ([place]) 06/29/2016    Past Surgical History:  Procedure Laterality Date  . APPENDECTOMY    . AV FISTULA PLACEMENT Left 08/03/2019   Procedure: LEFT ARM ARTERIOVENOUS (AV) CEPHALIC  FISTULA CREATION;  Surgeon: Waynetta Sandy, MD;  Location: [place];  Service: Vascular;  Laterality: Left;  . BIOPSY  06/30/2019   Procedure: BIOPSY;  Surgeon: Ronald Lobo, MD;  Location: [place];  Service: Endoscopy;;  . BIOPSY  08/02/2019   Procedure: BIOPSY;  Surgeon: Yetta Flock, MD;  Location: [place];  Service: Gastroenterology;;  . COLONOSCOPY  01/23/2012   Procedure: COLONOSCOPY;  Surgeon: Danie Binder, MD;  [facility];  Service: Endoscopy;  Laterality: N/A;  11:10 AM  . COLONOSCOPY WITH PROPOFOL N/A 06/30/2019   Procedure: COLONOSCOPY WITH PROPOFOL;  Surgeon: Ronald Lobo, MD;  Location: [place];  Service: Endoscopy;  Laterality: N/A;  . ENTEROSCOPY N/A 08/02/2019   Procedure: ENTEROSCOPY;  Surgeon: Yetta Flock, MD;  Location: [facility] ENDOSCOPY;  Service: Gastroenterology;  Laterality: N/A;  . ESOPHAGOGASTRODUODENOSCOPY (EGD) WITH PROPOFOL N/A 06/30/2019   Procedure: ESOPHAGOGASTRODUODENOSCOPY (EGD) WITH PROPOFOL;  Surgeon: Ronald Lobo, MD;  Location: [place];  Service: Endoscopy;  Laterality: N/A;  .  FISTULA SUPERFICIALIZATION Left 10/17/2019   Procedure: LEFT UPPER EXTREMITY FISTULA REVISION, SIDE BRANCH LIGATION,  AND SUPERFICIALIZATION;  Surgeon: Marty Heck, MD;  Location: [place];  Service: Vascular;  Laterality: Left;  . GIVENS CAPSULE STUDY N/A 06/30/2019   Procedure: GIVENS CAPSULE STUDY;  Surgeon: Ronald Lobo, MD;  Location: [facility] END[facility]COPY;  Service: Endoscopy;  Laterality: N/A;  . HEMOSTASIS CONTROL  08/02/2019   Procedure: HEMOSTASIS CONTROL;  Surgeon: Yetta Flock, MD;  Location: [place] [facility];  Service: Gastroenterology;;  . INCISION AND DRAINAGE ABSCESS N/A 06/29/2016   Procedure:  INCISION AND DRAINAGE ABDOMINAL WALL ABSCESS;  Surgeon: Alphonsa Overall, MD;  Location: [facility];  Service: General;  Laterality: N/A;  . INSERTION OF DIALYSIS CATHETER Right 08/03/2019   Procedure: INSERTION OF DIALYSIS CATHETER;  Surgeon: Waynetta Sandy, MD;  Location: [place];  Service: Vascular;  Laterality: Right;  . INSERTION OF DIALYSIS CATHETER Right 10/22/2019   Procedure: INSERTION OF 23CM TUNNELED DIALYSIS CATHETER RIGHT INTERNAL JUGULAR;  Surgeon: Angelia Mould, MD;  Location: [place];  Service: Vascular;  Laterality: Right;  . Left heel surgery    . PENILE BIOPSY N/A 03/26/2020   Procedure: PENILE ULCER DEBRIDEMENT;  Surgeon: Remi Haggard, MD;  Location: [facility];  Service: Urology;  Laterality: N/A;  30 MINS       Family History  Problem Relation Age of Onset  . Colon cancer Neg Hx     Social History   Tobacco Use  . Smoking status: Current Every Day Smoker    Packs/day: 1.00    Years: 30.00    Pack years: 30.00    Types: Cigarettes  . Smokeless tobacco: Never Used  Vaping Use  . Vaping Use: Never used  Substance Use Topics  . Alcohol use: Not Currently    Alcohol/week: 12.0 standard drinks    Types: 12 Cans of beer per week  . Drug use: Not Currently    Types: "Crack" cocaine    Comment: last in 2020    Home Medications Prior to Admission medications   Medication Sig Start Date End Date Taking? Authorizing Provider  mupirocin cream (BACTROBAN) 2 % Apply 1 application topically 2 (two) times daily. 04/30/20  Yes Isla Pence, MD  Pramoxine HCl 0.5 % OINT Apply 1 application topically 4 (four) times daily. 04/30/20  Yes Isla Pence, MD  sulfamethoxazole-trimethoprim (BACTRIM) 400-80 MG tablet Take 1 tablet by mouth 2 (two) times daily. 04/30/20  Yes Isla Pence, MD  amLODipine (NORVASC) 10 MG tablet Take 10 mg by mouth daily.    [provider]  blood glucose meter kit and supplies KIT Dispense based on patient and insurance  preference. Use up to four times daily as directed. (FOR ICD-9 250.00, 250.01). 07/01/16   Nita Sells, MD  Calcium Acetate 667 MG TABS Take 667-1,334 mg by mouth See admin instructions. Take 667 mg in the morning and afternoon and 1,334 in the evening 03/20/20   [provider]  cetirizine (ZYRTEC ALLERGY) 10 MG tablet Take 1 tablet (10 mg total) by mouth daily. 02/23/20   [name], NP  cloNIDine (CATAPRES) 0.2 MG tablet Take  1 tablet twice daily . If makes drowsy take 2 tablets - after he is off of work Patient taking differently: Take 0.2 mg by mouth See admin instructions. If makes drowsy take 2 tablets - after he is off of work 07/13/19   [name], NP  gabapentin (NEURONTIN) 300 MG capsule Take 1 capsule (300  mg total) by mouth 3 (three) times daily. Take 383m twice daily and 6029mat bedtime. Patient taking differently: Take 300-600 mg by mouth See admin instructions. Take 30032mwice daily and 600m25m bedtime. 02/23/20   EdwaKerin Perna  hydrOXYzine (ATARAX/VISTARIL) 25 MG tablet Take 1 tablet (25 mg total) by mouth 2 (two) times daily as needed. Patient taking differently: Take 25 mg by mouth 2 (two) times daily as needed (Pain). 02/23/20   EdwaKerin Perna  lidocaine-prilocaine (EMLA) cream Apply 1 application topically daily as needed. 01/24/20   [provider]  mupirocin ointment (BACTROBAN) 2 % Apply 1 application topically 2 (two) times daily. 02/23/20   HaglVanessa Kick  oxyCODONE-acetaminophen (PERCOCET) 5-325 MG tablet Take 1 tablet by mouth every 4 (four) hours as needed for severe pain. 03/26/20 03/26/21  NewsRemi Haggard  oxyCODONE-acetaminophen (PERCOCET/ROXICET) 5-325 MG tablet Take 1 tablet by mouth every 4 (four) hours as needed. 03/21/20   [provider]  triamcinolone (KENALOG) 0.1 % Apply 1 application topically 2 (two) times daily.    [provider]  ferrous sulfate 325 (65 FE) MG tablet  SMARTSIG:1 Tablet(s) By Mouth Twice Daily 08/11/19 02/09/20  [provider]  furosemide (LASIX) 40 MG tablet Take 1 tablet (40 mg total) by mouth daily. 07/25/19 02/09/20  JoneLadona Horns    Allergies    Patient has no known allergies.  Review of Systems   Review of Systems  Genitourinary: Positive for penile pain.  Skin: Positive for rash.  All other systems reviewed and are negative.   Physical Exam Updated Vital Signs BP (!) 154/72   Pulse 96   Temp 98.5 F (36.9 C) (Oral)   Resp 15   Ht _0  (1.803 m)   Wt 118.4 kg   SpO2 100%   BMI 36.40 kg/m   Physical Exam Vitals and nursing note reviewed. Exam conducted with a chaperone present.  Constitutional:      Appearance: Normal appearance. He is obese.  HENT:     Head: Normocephalic and atraumatic.     Right Ear: External ear normal.     Left Ear: External ear normal.     Nose: Nose normal.     Mouth/Throat:     Mouth: Mucous membranes are moist.     Pharynx: Oropharynx is clear.  Eyes:     Extraocular Movements: Extraocular movements intact.     Conjunctiva/sclera: Conjunctivae normal.     Pupils: Pupils are equal, round, and reactive to light.  Cardiovascular:     Rate and Rhythm: Normal rate and regular rhythm.     Pulses: Normal pulses.     Heart sounds: Normal heart sounds.  Pulmonary:     Effort: Pulmonary effort is normal.     Breath sounds: Normal breath sounds.  Abdominal:     General: Abdomen is flat. Bowel sounds are normal.     Palpations: Abdomen is soft.  Genitourinary:    Penis: Uncircumcised.      Comments: Large ulceration to penile shaft See picture. Musculoskeletal:        General: Normal range of motion.     Cervical back: Normal range of motion and neck supple.  Skin:    General: Skin is warm.     Capillary Refill: Capillary refill takes less than 2 seconds.  Neurological:     General: No focal deficit present.     Mental Status: He is alert and oriented to person, place,  and time.  Psychiatric:        Mood and Affect: Mood normal.        Behavior: Behavior normal.       ED Results / Procedures / Treatments   Labs (all labs ordered are listed, but only abnormal results are displayed) Labs Reviewed  BASIC METABOLIC PANEL - Abnormal; Notable for the following components:      Result Value   Sodium 134 (*)    Chloride 90 (*)    Glucose, Bld 107 (*)    BUN 44 (*)    Creatinine, Ser 8.18 (*)    GFR, Estimated 7 (*)    Anion gap 22 (*)    All other components within normal limits  CBC WITH DIFFERENTIAL/PLATELET - Abnormal; Notable for the following components:   RBC 3.00 (*)    Hemoglobin 10.1 (*)    HCT 32.1 (*)    MCV 107.0 (*)    nRBC 0.9 (*)    Abs Immature Granulocytes 0.08 (*)    All other components within normal limits    EKG None  Radiology No results found.  Procedures Procedures (including critical care time)  Medications Ordered in ED Medications  sulfamethoxazole-trimethoprim (BACTRIM DS) 800-160 MG per tablet 1 tablet (has no administration in time range)  diphenhydrAMINE (BENADRYL) injection 50 mg (50 mg Intramuscular Given 04/30/20 1042)  HYDROcodone-acetaminophen (NORCO/VICODIN) 5-325 MG per tablet 1 tablet (1 tablet Oral Given 04/30/20 1126)    ED Course  I have reviewed the triage vital signs and the nursing notes.  Pertinent labs & imaging results that were available during my care of the patient were reviewed by me and considered in my medical decision making (see chart for details).    MDM Rules/Calculators/A&P                          Pt is already on atarax and neurontin.  I will try pramoxine ointment to see if that helps.  Pt encouraged to go to his dialysis treatments as that will help with the itching.  Pt d/w Dr. Lovena Neighbours (urology) who recommended abx and f/u with Dr. Milford Cage.  Pt thinks he has an appt on 1/5.  Pt d/c home with renally dosed bactrim.    Pt is to return if worse.  Final Clinical  Impression(s) / ED Diagnoses Final diagnoses:  Prurigo nodularis  Penile ulcer  Uremic pruritus  ESRD on hemodialysis ([place])    Rx / DC Orders ED Discharge Orders         Ordered    Pramoxine HCl 0.5 % OINT  4 times daily        04/30/20 1141    mupirocin cream (BACTROBAN) 2 %  2 times daily        04/30/20 1221    sulfamethoxazole-trimethoprim (BACTRIM) 400-80 MG tablet  2 times daily        04/30/20 1221           Isla Pence, MD 04/30/20 1221

## 2020-04-30 NOTE — Discharge Instructions (Addendum)
Go to your dialysis sessions as scheduled.  This will help the itching.

## 2020-05-01 DIAGNOSIS — D408 Neoplasm of uncertain behavior of other specified male genital organs: Secondary | ICD-10-CM | POA: Insufficient documentation

## 2020-05-20 ENCOUNTER — Emergency Department (HOSPITAL_COMMUNITY)
Admission: EM | Admit: 2020-05-20 | Discharge: 2020-05-20 | Disposition: A | Discharge status: Home / Self Care | Payer: Medicare Other | Attending: Emergency Medicine | Admitting: Emergency Medicine

## 2020-05-20 ENCOUNTER — Encounter (HOSPITAL_COMMUNITY): Payer: Self-pay

## 2020-05-20 DIAGNOSIS — Z992 Dependence on renal dialysis: Secondary | ICD-10-CM | POA: Diagnosis not present

## 2020-05-20 DIAGNOSIS — I120 Hypertensive chronic kidney disease with stage 5 chronic kidney disease or end stage renal disease: Secondary | ICD-10-CM | POA: Insufficient documentation

## 2020-05-20 DIAGNOSIS — U071 COVID-19: Secondary | ICD-10-CM | POA: Diagnosis not present

## 2020-05-20 DIAGNOSIS — N186 End stage renal disease: Secondary | ICD-10-CM | POA: Insufficient documentation

## 2020-05-20 DIAGNOSIS — R109 Unspecified abdominal pain: Secondary | ICD-10-CM | POA: Diagnosis not present

## 2020-05-20 DIAGNOSIS — N485 Ulcer of penis: Secondary | ICD-10-CM | POA: Diagnosis not present

## 2020-05-20 DIAGNOSIS — Z79899 Other long term (current) drug therapy: Secondary | ICD-10-CM | POA: Insufficient documentation

## 2020-05-20 DIAGNOSIS — E1122 Type 2 diabetes mellitus with diabetic chronic kidney disease: Secondary | ICD-10-CM | POA: Insufficient documentation

## 2020-05-20 DIAGNOSIS — F17210 Nicotine dependence, cigarettes, uncomplicated: Secondary | ICD-10-CM | POA: Diagnosis not present

## 2020-05-20 DIAGNOSIS — N4889 Other specified disorders of penis: Secondary | ICD-10-CM | POA: Diagnosis present

## 2020-05-20 LAB — CBC
HCT: 27.6 % — ABNORMAL LOW (ref 39.0–52.0)
Hemoglobin: 8.4 g/dL — ABNORMAL LOW (ref 13.0–17.0)
MCH: 32.8 pg (ref 26.0–34.0)
MCHC: 30.4 g/dL (ref 30.0–36.0)
MCV: 107.8 fL — ABNORMAL HIGH (ref 80.0–100.0)
Platelets: 109 10*3/uL — ABNORMAL LOW (ref 150–400)
RBC: 2.56 MIL/uL — ABNORMAL LOW (ref 4.22–5.81)
RDW: 16.8 % — ABNORMAL HIGH (ref 11.5–15.5)
WBC: 5.7 10*3/uL (ref 4.0–10.5)
nRBC: 0.7 % — ABNORMAL HIGH (ref 0.0–0.2)

## 2020-05-20 LAB — COMPREHENSIVE METABOLIC PANEL
ALT: 25 U/L (ref 0–44)
AST: 78 U/L — ABNORMAL HIGH (ref 15–41)
Albumin: 2.8 g/dL — ABNORMAL LOW (ref 3.5–5.0)
Alkaline Phosphatase: 48 U/L (ref 38–126)
Anion gap: 19 — ABNORMAL HIGH (ref 5–15)
BUN: 46 mg/dL — ABNORMAL HIGH (ref 6–20)
CO2: 24 mmol/L (ref 22–32)
Calcium: 8.7 mg/dL — ABNORMAL LOW (ref 8.9–10.3)
Chloride: 92 mmol/L — ABNORMAL LOW (ref 98–111)
Creatinine, Ser: 12.34 mg/dL — ABNORMAL HIGH (ref 0.61–1.24)
GFR, Estimated: 4 mL/min — ABNORMAL LOW (ref >60)
Glucose, Bld: 93 mg/dL (ref 70–99)
Potassium: 4.3 mmol/L (ref 3.5–5.1)
Sodium: 135 mmol/L (ref 135–145)
Total Bilirubin: 0.9 mg/dL (ref 0.3–1.2)
Total Protein: 8.2 g/dL — ABNORMAL HIGH (ref 6.5–8.1)

## 2020-05-20 LAB — LIPASE, BLOOD: Lipase: 107 U/L — ABNORMAL HIGH (ref 11–51)

## 2020-05-20 MED ORDER — HYDROMORPHONE HCL 1 MG/ML IJ SOLN
0.5000 mg | Freq: Once | INTRAMUSCULAR | Status: AC
  Administered 2020-05-20: 0.5 mg via INTRAVENOUS
  Filled 2020-05-20: qty 1

## 2020-05-20 MED ORDER — HYDROMORPHONE HCL 2 MG PO TABS
2.0000 mg | ORAL_TABLET | ORAL | 0 refills | Status: DC | PRN
Start: 2020-05-20 — End: 2020-07-29

## 2020-05-20 NOTE — ED Provider Notes (Signed)
[facility] EMERGENCY DEPARTMENT Provider Note   CSN: [id number] Arrival date & time: 05/20/20  3235     History Chief Complaint  Patient presents with  . Abdominal Pain    Daniel [name] is a 60 y.o. male.  60 yo M with a chief complaint of pain to his penis.  This has been an ongoing issue.  Was supposed to have a procedure done by urologist at [facility] yesterday however was found to be febrile and tested positive for the coronavirus.  The surgery was called off and the patient was sent home.  He states that he has been aching all over that he has been coughing and having fevers.  Feels like the pain to his penis is gotten worse and he feels like he cannot deal with it anymore.  Feels like something needs to be done for it.  The history is provided by the patient.  Illness Severity:  Moderate Onset quality:  Gradual Duration:  2 days Timing:  Constant Progression:  Worsening Chronicity:  New Associated symptoms: cough, fever, myalgias and shortness of breath   Associated symptoms: no abdominal pain, no chest pain, no congestion, no diarrhea, no headaches, no rash and no vomiting        Past Medical History:  Diagnosis Date  . Anemia   . Diabetes mellitus without complication ([place])    patient denies  . Dialysis patient ([place])   . End stage chronic kidney disease ([place])   . Hypertension   . ICH (intracerebral hemorrhage) ([place]) 05/20/2017  . Shoulder pain, left 06/28/2013    Patient Active Problem List   Diagnosis Date Noted  . ESRD (end stage renal disease) ([place])   . AVM (arteriovenous malformation) of small bowel, acquired with hemorrhage   . Symptomatic anemia 07/31/2019  . Acute on chronic anemia 07/23/2019  . Chronic kidney disease, stage V ([place])   . Chronic hepatitis C without hepatic coma ([employer]) 07/11/2019  . Iron deficiency anemia 03/30/2019  . CKD (chronic kidney disease), stage V ([place]) 03/30/2019  . Alcohol use disorder 03/30/2019  . B12  deficiency 03/30/2019  . Orthostatic hypotension 01/22/2019  . Anemia   . Hypertension 06/29/2016  . DM2 (diabetes mellitus, type 2) ([place]) 06/29/2016    Past Surgical History:  Procedure Laterality Date  . APPENDECTOMY    . AV FISTULA PLACEMENT Left 08/03/2019   Procedure: LEFT ARM ARTERIOVENOUS (AV) CEPHALIC  FISTULA CREATION;  Surgeon: Waynetta Sandy, MD;  Location: [place];  Service: Vascular;  Laterality: Left;  . BIOPSY  06/30/2019   Procedure: BIOPSY;  Surgeon: Ronald Lobo, MD;  Location: [place];  Service: Endoscopy;;  . BIOPSY  08/02/2019   Procedure: BIOPSY;  Surgeon: Yetta Flock, MD;  Location: [place];  Service: Gastroenterology;;  . COLONOSCOPY  01/23/2012   Procedure: COLONOSCOPY;  Surgeon: Danie Binder, MD;  [facility];  Service: Endoscopy;  Laterality: N/A;  11:10 AM  . COLONOSCOPY WITH PROPOFOL N/A 06/30/2019   Procedure: COLONOSCOPY WITH PROPOFOL;  Surgeon: Ronald Lobo, MD;  [place];  Service: Endoscopy;  Laterality: N/A;  . ENTEROSCOPY N/A 08/02/2019   Procedure: ENTEROSCOPY;  Surgeon: Yetta Flock, MD;  Location: [facility] ENDOSCOPY;  Service: Gastroenterology;  Laterality: N/A;  . ESOPHAGOGASTRODUODENOSCOPY (EGD) WITH PROPOFOL N/A 06/30/2019   Procedure: ESOPHAGOGASTRODUODENOSCOPY (EGD) WITH PROPOFOL;  Surgeon: Ronald Lobo, MD;  Location: [place];  Service: Endoscopy;  Laterality: N/A;  . FISTULA SUPERFICIALIZATION Left 10/17/2019   Procedure: LEFT  UPPER EXTREMITY FISTULA REVISION, SIDE BRANCH LIGATION,  AND SUPERFICIALIZATION;  Surgeon: Marty Heck, MD;  Location: [place];  Service: Vascular;  Laterality: Left;  . GIVENS CAPSULE STUDY N/A 06/30/2019   Procedure: GIVENS CAPSULE STUDY;  Surgeon: Ronald Lobo, MD;  Location: [facility] ENDOSCOPY;  Service: Endoscopy;  Laterality: N/A;  . HEMOSTASIS CONTROL  08/02/2019   Procedure: HEMOSTASIS CONTROL;  Surgeon: Yetta Flock, MD;  Location: [place] ENDOSCOPY;   Service: Gastroenterology;;  . INCISION AND DRAINAGE ABSCESS N/A 06/29/2016   Procedure: INCISION AND DRAINAGE ABDOMINAL WALL ABSCESS;  Surgeon: Alphonsa Overall, MD;  Location: [facility];  Service: General;  Laterality: N/A;  . INSERTION OF DIALYSIS CATHETER Right 08/03/2019   Procedure: INSERTION OF DIALYSIS CATHETER;  Surgeon: Waynetta Sandy, MD;  Location: [place];  Service: Vascular;  Laterality: Right;  . INSERTION OF DIALYSIS CATHETER Right 10/22/2019   Procedure: INSERTION OF 23CM TUNNELED DIALYSIS CATHETER RIGHT INTERNAL JUGULAR;  Surgeon: Angelia Mould, MD;  Location: [place];  Service: Vascular;  Laterality: Right;  . Left heel surgery    . PENILE BIOPSY N/A 03/26/2020   Procedure: PENILE ULCER DEBRIDEMENT;  Surgeon: Remi Haggard, MD;  Location: [facility];  Service: Urology;  Laterality: N/A;  30 MINS       Family History  Problem Relation Age of Onset  . Colon cancer Neg Hx     Social History   Tobacco Use  . Smoking status: Current Every Day Smoker    Packs/day: 1.00    Years: 30.00    Pack years: 30.00    Types: Cigarettes  . Smokeless tobacco: Never Used  Vaping Use  . Vaping Use: Never used  Substance Use Topics  . Alcohol use: Not Currently    Alcohol/week: 12.0 standard drinks    Types: 12 Cans of beer per week  . Drug use: Not Currently    Types: "Crack" cocaine    Comment: last in 2020    Home Medications Prior to Admission medications   Medication Sig Start Date End Date Taking? Authorizing Provider  HYDROmorphone (DILAUDID) 2 MG tablet Take 1 tablet (2 mg total) by mouth every 4 (four) hours as needed for severe pain. 05/20/20  Yes Deno Etienne, DO  amLODipine (NORVASC) 10 MG tablet Take 10 mg by mouth daily.    [provider]  blood glucose meter kit and supplies KIT Dispense based on patient and insurance preference. Use up to four times daily as directed. (FOR ICD-9 250.00, 250.01). 07/01/16   Nita Sells, MD  Calcium Acetate  667 MG TABS Take 667-1,334 mg by mouth See admin instructions. Take 667 mg in the morning and afternoon and 1,334 in the evening 03/20/20   [provider]  cetirizine (ZYRTEC ALLERGY) 10 MG tablet Take 1 tablet (10 mg total) by mouth daily. 02/23/20   [name], NP  cloNIDine (CATAPRES) 0.2 MG tablet Take  1 tablet twice daily . If makes drowsy take 2 tablets - after he is off of work Patient taking differently: Take 0.2 mg by mouth See admin instructions. If makes drowsy take 2 tablets - after he is off of work 07/13/19   [name], NP  gabapentin (NEURONTIN) 300 MG capsule Take 1 capsule (300 mg total) by mouth 3 (three) times daily. Take 369m twice daily and 6023mat bedtime. Patient taking differently: Take 300-600 mg by mouth See admin instructions. Take 30062mwice daily and 600m67m bedtime. 02/23/20   EdwaJuluis Mire  P, NP  hydrOXYzine (ATARAX/VISTARIL) 25 MG tablet Take 1 tablet (25 mg total) by mouth 2 (two) times daily as needed. Patient taking differently: Take 25 mg by mouth 2 (two) times daily as needed (Pain). 02/23/20   [name], NP  lidocaine-prilocaine (EMLA) cream Apply 1 application topically daily as needed. 01/24/20   [provider]  mupirocin cream (BACTROBAN) 2 % Apply 1 application topically 2 (two) times daily. 04/30/20   Isla Pence, MD  mupirocin ointment (BACTROBAN) 2 % Apply 1 application topically 2 (two) times daily. 02/23/20   Vanessa Kick, MD  oxyCODONE-acetaminophen (PERCOCET) 5-325 MG tablet Take 1 tablet by mouth every 4 (four) hours as needed for severe pain. 03/26/20 03/26/21  Remi Haggard, MD  oxyCODONE-acetaminophen (PERCOCET/ROXICET) 5-325 MG tablet Take 1 tablet by mouth every 4 (four) hours as needed. 03/21/20   [provider]  Pramoxine HCl 0.5 % OINT Apply 1 application topically 4 (four) times daily. 04/30/20   Isla Pence, MD  sulfamethoxazole-trimethoprim (BACTRIM) 400-80 MG tablet  Take 1 tablet by mouth 2 (two) times daily. 04/30/20   Isla Pence, MD  triamcinolone (KENALOG) 0.1 % Apply 1 application topically 2 (two) times daily.    [provider]  ferrous sulfate 325 (65 FE) MG tablet SMARTSIG:1 Tablet(s) By Mouth Twice Daily 08/11/19 02/09/20  [provider]  furosemide (LASIX) 40 MG tablet Take 1 tablet (40 mg total) by mouth daily. 07/25/19 02/09/20  Ladona Horns, MD    Allergies    Patient has no known allergies.  Review of Systems   Review of Systems  Constitutional: Positive for chills and fever.  HENT: Negative for congestion and facial swelling.   Eyes: Negative for discharge and visual disturbance.  Respiratory: Positive for cough and shortness of breath.   Cardiovascular: Negative for chest pain and palpitations.  Gastrointestinal: Negative for abdominal pain, diarrhea and vomiting.  Genitourinary: Positive for penile pain.  Musculoskeletal: Positive for myalgias. Negative for arthralgias.  Skin: Positive for wound. Negative for color change and rash.  Neurological: Negative for tremors, syncope and headaches.  Psychiatric/Behavioral: Negative for confusion and dysphoric mood.    Physical Exam Updated Vital Signs BP 121/72   Pulse 88   Temp 98.6 F (37 C) (Oral)   Resp 20   SpO2 93%   Physical Exam Vitals and nursing note reviewed.  Constitutional:      Appearance: He is well-developed and well-nourished. He is obese.  HENT:     Head: Normocephalic and atraumatic.  Eyes:     Extraocular Movements: EOM normal.     Pupils: Pupils are equal, round, and reactive to light.  Neck:     Vascular: No JVD.  Cardiovascular:     Rate and Rhythm: Normal rate and regular rhythm.     Heart sounds: No murmur heard. No friction rub. No gallop.   Pulmonary:     Effort: No respiratory distress.     Breath sounds: No wheezing.  Abdominal:     General: There is no distension.     Tenderness: There is no guarding or rebound.   Genitourinary:    Comments: Ulceration with erosion of the head of the penis mostly on the dorsal aspect.  No purulent drainage no fluctuance.  No induration. Musculoskeletal:        General: Normal range of motion.     Cervical back: Normal range of motion and neck supple.  Skin:    Coloration: Skin is not pale.  Findings: No rash.  Neurological:     Mental Status: He is alert and oriented to person, place, and time.  Psychiatric:        Mood and Affect: Mood and affect normal.        Behavior: Behavior normal.     ED Results / Procedures / Treatments   Labs (all labs ordered are listed, but only abnormal results are displayed) Labs Reviewed  LIPASE, BLOOD - Abnormal; Notable for the following components:      Result Value   Lipase 107 (*)    All other components within normal limits  COMPREHENSIVE METABOLIC PANEL - Abnormal; Notable for the following components:   Chloride 92 (*)    BUN 46 (*)    Creatinine, Ser 12.34 (*)    Calcium 8.7 (*)    Total Protein 8.2 (*)    Albumin 2.8 (*)    AST 78 (*)    GFR, Estimated 4 (*)    Anion gap 19 (*)    All other components within normal limits  CBC - Abnormal; Notable for the following components:   RBC 2.56 (*)    Hemoglobin 8.4 (*)    HCT 27.6 (*)    MCV 107.8 (*)    RDW 16.8 (*)    Platelets 109 (*)    nRBC 0.7 (*)    All other components within normal limits  URINALYSIS, ROUTINE W REFLEX MICROSCOPIC    EKG EKG Interpretation  Date/Time:  Sunday May 20 2020 07:59:48 EST Ventricular Rate:  91 PR Interval:    QRS Duration: 88 QT Interval:  376 QTC Calculation: 463 R Axis:   -20 Text Interpretation: Sinus rhythm Inferior infarct, old Anteroseptal infarct, old No significant change since last tracing Confirmed by ,  ([id number]) on 05/20/2020 8:24:31 AM   Radiology No results found.  Procedures Procedures (including critical care time)  Medications Ordered in ED Medications  HYDROmorphone  (DILAUDID) injection 0.5 mg (0.5 mg Intravenous Given 05/20/20 0813)    ED Course  I have reviewed the triage vital signs and the nursing notes.  Pertinent labs & imaging results that were available during my care of the patient were reviewed by me and considered in my medical decision making (see chart for details).    MDM Rules/Calculators/A&P                          59  yo M with chief complaints of worsening pain to the tip of his penis.  Patient has a suspected vascular lesion to his penis.  He was scheduled to have a procedure done yesterday however had fevers and chills upon arrival and so was sent to the ED and diagnosed with coronavirus.  His surgery was called off and he was sent home.  Not immediately hypoxic here.  Will attempt to ambulate.  Will try some pain medicine for home and so that he can make it to outpatient follow-up.  He has missed dialysis this week and so we will obtain a laboratory evaluation.  Has been able to urinate.   Work-up here without hypoxia on ambulation.  No hyperkalemia no acidosis no fluid overload.  We will have the patient follow-up with his dialysis center and his urologist.  9:20 AM:  I have discussed the diagnosis/risks/treatment options with the patient and believe the pt to be eligible for discharge home to follow-up with Urology. We also discussed returning to the ED immediately if new or  worsening sx occur. We discussed the sx which are most concerning (e.g., sudden worsening pain, fever, inability to tolerate by mouth, sob) that necessitate immediate return. Medications administered to the patient during their visit and any new prescriptions provided to the patient are listed below.  Medications given during this visit Medications  HYDROmorphone (DILAUDID) injection 0.5 mg (0.5 mg Intravenous Given 05/20/20 0813)     The patient appears reasonably screen and/or stabilized for discharge and I doubt any other medical condition or other [facility]  requiring further screening, evaluation, or treatment in the ED at this time prior to discharge.    Final Clinical Impression(s) / ED Diagnoses Final diagnoses:  Penile ulcer  COVID-19 virus infection    Rx / DC Orders ED Discharge Orders         Ordered    HYDROmorphone (DILAUDID) 2 MG tablet  Every 4 hours PRN        05/20/20 0901           Deno Etienne, DO 05/20/20 0920

## 2020-05-20 NOTE — ED Triage Notes (Addendum)
Pt reports that he went to a hospital yesterday and was diagnosed with colitis and covid and told to come here if abd pain is worse, denies n/v/d

## 2020-05-20 NOTE — ED Notes (Signed)
Ambulated patient in room on pulse oximetry. Patient's oxygen level remained at 93% on room air while ambulating. Patient reported pain while walking but did not report feeling short of breath.

## 2020-05-20 NOTE — Discharge Instructions (Signed)
Follow-up with your urologist in the office.  I prescribed you a very short course of pain medicine to try and take other than the pain medicine you had at home.  Your urologist should be managing this pain for you.  Please discuss with them that your pain is gotten worse and brought you to the ED.  You also have the coronavirus.  The big thing that should bring you back to the hospital would be difficulty breathing or if you are unable to eat or drink.  Please return to the ED if that occurs.

## 2020-05-29 ENCOUNTER — Emergency Department (HOSPITAL_COMMUNITY)
Admission: EM | Admit: 2020-05-29 | Discharge: 2020-05-29 | Disposition: A | Discharge status: Home / Self Care | Payer: Medicare Other | Attending: Emergency Medicine | Admitting: Emergency Medicine

## 2020-05-29 ENCOUNTER — Encounter (HOSPITAL_COMMUNITY): Payer: Self-pay | Admitting: Emergency Medicine

## 2020-05-29 DIAGNOSIS — Z79899 Other long term (current) drug therapy: Secondary | ICD-10-CM | POA: Diagnosis not present

## 2020-05-29 DIAGNOSIS — N186 End stage renal disease: Secondary | ICD-10-CM | POA: Insufficient documentation

## 2020-05-29 DIAGNOSIS — M79671 Pain in right foot: Secondary | ICD-10-CM | POA: Insufficient documentation

## 2020-05-29 DIAGNOSIS — E1122 Type 2 diabetes mellitus with diabetic chronic kidney disease: Secondary | ICD-10-CM | POA: Diagnosis not present

## 2020-05-29 DIAGNOSIS — F17210 Nicotine dependence, cigarettes, uncomplicated: Secondary | ICD-10-CM | POA: Insufficient documentation

## 2020-05-29 DIAGNOSIS — Z992 Dependence on renal dialysis: Secondary | ICD-10-CM | POA: Insufficient documentation

## 2020-05-29 DIAGNOSIS — I120 Hypertensive chronic kidney disease with stage 5 chronic kidney disease or end stage renal disease: Secondary | ICD-10-CM | POA: Diagnosis not present

## 2020-05-29 DIAGNOSIS — M79672 Pain in left foot: Secondary | ICD-10-CM

## 2020-05-29 MED ORDER — HYDROCODONE-ACETAMINOPHEN 5-325 MG PO TABS: 1.0000 | ORAL_TABLET | ORAL | 0 refills | Status: DC | PRN

## 2020-05-29 NOTE — ED Triage Notes (Signed)
Pt reports bilateral foot pain x2 days, denies any recent injury, hx of DM, unsure if he has neuropathy or not. Denies swelling.

## 2020-05-29 NOTE — ED Provider Notes (Signed)
[name] EMERGENCY DEPARTMENT Provider Note   CSN: [id number] Arrival date & time: 05/29/20  0338     History Chief Complaint  Patient presents with  . Foot Pain    Daniel [name] is a 60 y.o. male.  The history is provided by the patient.  Foot Pain This is a new problem. The problem occurs constantly. The problem has not changed since onset.Nothing aggravates the symptoms. Nothing relieves the symptoms. He has tried nothing for the symptoms. The treatment provided no relief.   Pt complains of pain in both feet.  Pt reports he has had pain for over a month.  Pt is on dialysis.  Pt denies any injury.  Pt denies any new condition     Past Medical History:  Diagnosis Date  . Anemia   . Diabetes mellitus without complication ([place])    patient denies  . Dialysis patient ([place])   . End stage chronic kidney disease ([place])   . Hypertension   . ICH (intracerebral hemorrhage) ([place]) 05/20/2017  . Shoulder pain, left 06/28/2013    Patient Active Problem List   Diagnosis Date Noted  . ESRD (end stage renal disease) ([place])   . AVM (arteriovenous malformation) of small bowel, acquired with hemorrhage   . Symptomatic anemia 07/31/2019  . Acute on chronic anemia 07/23/2019  . Chronic kidney disease, stage V ([place])   . Chronic hepatitis C without hepatic coma ([place]) 07/11/2019  . Iron deficiency anemia 03/30/2019  . CKD (chronic kidney disease), stage V ([place]) 03/30/2019  . Alcohol use disorder 03/30/2019  . B12 deficiency 03/30/2019  . Orthostatic hypotension 01/22/2019  . Anemia   . Hypertension 06/29/2016  . DM2 (diabetes mellitus, type 2) ([place]) 06/29/2016    Past Surgical History:  Procedure Laterality Date  . APPENDECTOMY    . AV FISTULA PLACEMENT Left 08/03/2019   Procedure: LEFT ARM ARTERIOVENOUS (AV) CEPHALIC  FISTULA CREATION;  Surgeon: Waynetta Sandy, MD;  Location: [place];  Service: Vascular;  Laterality: Left;  . BIOPSY  06/30/2019    Procedure: BIOPSY;  Surgeon: Ronald Lobo, MD;  Location: [place];  Service: Endoscopy;;  . BIOPSY  08/02/2019   Procedure: BIOPSY;  Surgeon: Yetta Flock, MD;  Location: [place];  Service: Gastroenterology;;  . COLONOSCOPY  01/23/2012   Procedure: COLONOSCOPY;  Surgeon: Danie Binder, MD;  [facility];  Service: Endoscopy;  Laterality: N/A;  11:10 AM  . COLONOSCOPY WITH PROPOFOL N/A 06/30/2019   Procedure: COLONOSCOPY WITH PROPOFOL;  Surgeon: Ronald Lobo, MD;  Location: [place];  Service: Endoscopy;  Laterality: N/A;  . ENTEROSCOPY N/A 08/02/2019   Procedure: ENTEROSCOPY;  Surgeon: Yetta Flock, MD;  Location: [facility] ENDOSCOPY;  Service: Gastroenterology;  Laterality: N/A;  . ESOPHAGOGASTRODUODENOSCOPY (EGD) WITH PROPOFOL N/A 06/30/2019   Procedure: ESOPHAGOGASTRODUODENOSCOPY (EGD) WITH PROPOFOL;  Surgeon: Ronald Lobo, MD;  Location: [place];  Service: Endoscopy;  Laterality: N/A;  . FISTULA SUPERFICIALIZATION Left 10/17/2019   Procedure: LEFT UPPER EXTREMITY FISTULA REVISION, SIDE BRANCH LIGATION,  AND SUPERFICIALIZATION;  Surgeon: Marty Heck, MD;  Location: [place];  Service: Vascular;  Laterality: Left;  . GIVENS CAPSULE STUDY N/A 06/30/2019   Procedure: GIVENS CAPSULE STUDY;  Surgeon: Ronald Lobo, MD;  Location: [facility] ENDOSCOPY;  Service: Endoscopy;  Laterality: N/A;  . HEMOSTASIS CONTROL  08/02/2019   Procedure: HEMOSTASIS CONTROL;  Surgeon: Yetta Flock, MD;  Location: [place];  Service: Gastroenterology;;  . INCISION AND DRAINAGE ABSCESS N/A 06/29/2016  Procedure: INCISION AND DRAINAGE ABDOMINAL WALL ABSCESS;  Surgeon: Alphonsa Overall, MD;  [facility];  Service: General;  Laterality: N/A;  . INSERTION OF DIALYSIS CATHETER Right 08/03/2019   Procedure: INSERTION OF DIALYSIS CATHETER;  Surgeon: Waynetta Sandy, MD;  Location: [place];  Service: Vascular;  Laterality: Right;  . INSERTION OF DIALYSIS CATHETER Right  10/22/2019   Procedure: INSERTION OF 23CM TUNNELED DIALYSIS CATHETER RIGHT INTERNAL JUGULAR;  Surgeon: Angelia Mould, MD;  Location: [place];  Service: Vascular;  Laterality: Right;  . Left heel surgery    . PENILE BIOPSY N/A 03/26/2020   Procedure: PENILE ULCER DEBRIDEMENT;  Surgeon: Remi Haggard, MD;  [facility];  Service: Urology;  Laterality: N/A;  30 MINS       Family History  Problem Relation Age of Onset  . Colon cancer Neg Hx     Social History   Tobacco Use  . Smoking status: Current Every Day Smoker    Packs/day: 1.00    Years: 30.00    Pack years: 30.00    Types: Cigarettes  . Smokeless tobacco: Never Used  Vaping Use  . Vaping Use: Never used  Substance Use Topics  . Alcohol use: Not Currently    Alcohol/week: 12.0 standard drinks    Types: 12 Cans of beer per week  . Drug use: Not Currently    Types: "Crack" cocaine    Comment: last in 2020    Home Medications Prior to Admission medications   Medication Sig Start Date End Date Taking? Authorizing Provider  HYDROcodone-acetaminophen (NORCO/VICODIN) 5-325 MG tablet Take 1 tablet by mouth every 4 (four) hours as needed for moderate pain. 05/29/20 05/29/21 Yes Caryl Ada K, PA-C  amLODipine (NORVASC) 10 MG tablet Take 10 mg by mouth daily.    [provider]  blood glucose meter kit and supplies KIT Dispense based on patient and insurance preference. Use up to four times daily as directed. (FOR ICD-9 250.00, 250.01). 07/01/16   Nita Sells, MD  Calcium Acetate 667 MG TABS Take 667-1,334 mg by mouth See admin instructions. Take 667 mg in the morning and afternoon and 1,334 in the evening 03/20/20   [provider]  cetirizine (ZYRTEC ALLERGY) 10 MG tablet Take 1 tablet (10 mg total) by mouth daily. 02/23/20   [name], NP  cloNIDine (CATAPRES) 0.2 MG tablet Take  1 tablet twice daily . If makes drowsy take 2 tablets - after he is off of work Patient taking  differently: Take 0.2 mg by mouth See admin instructions. If makes drowsy take 2 tablets - after he is off of work 07/13/19   [name], NP  gabapentin (NEURONTIN) 300 MG capsule Take 1 capsule (300 mg total) by mouth 3 (three) times daily. Take 312m twice daily and 6030mat bedtime. Patient taking differently: Take 300-600 mg by mouth See admin instructions. Take 30032mwice daily and 600m22m bedtime. 02/23/20   EdwaKerin Perna  HYDROmorphone (DILAUDID) 2 MG tablet Take 1 tablet (2 mg total) by mouth every 4 (four) hours as needed for severe pain. 05/20/20   FloyDeno Etienne  hydrOXYzine (ATARAX/VISTARIL) 25 MG tablet Take 1 tablet (25 mg total) by mouth 2 (two) times daily as needed. Patient taking differently: Take 25 mg by mouth 2 (two) times daily as needed (Pain). 02/23/20   EdwaKerin Perna  lidocaine-prilocaine (EMLA) cream Apply 1 application topically daily as needed. 01/24/20   [provider]  mupirocin cream (BACTROBAN) 2 % Apply 1 application topically 2 (two) times daily. 04/30/20   Isla Pence, MD  mupirocin ointment (BACTROBAN) 2 % Apply 1 application topically 2 (two) times daily. 02/23/20   Vanessa Kick, MD  oxyCODONE-acetaminophen (PERCOCET) 5-325 MG tablet Take 1 tablet by mouth every 4 (four) hours as needed for severe pain. 03/26/20 03/26/21  Remi Haggard, MD  oxyCODONE-acetaminophen (PERCOCET/ROXICET) 5-325 MG tablet Take 1 tablet by mouth every 4 (four) hours as needed. 03/21/20   [provider]  Pramoxine HCl 0.5 % OINT Apply 1 application topically 4 (four) times daily. 04/30/20   Isla Pence, MD  sulfamethoxazole-trimethoprim (BACTRIM) 400-80 MG tablet Take 1 tablet by mouth 2 (two) times daily. 04/30/20   Isla Pence, MD  triamcinolone (KENALOG) 0.1 % Apply 1 application topically 2 (two) times daily.    [provider]  ferrous sulfate 325 (65 FE) MG tablet SMARTSIG:1 Tablet(s) By Mouth Twice Daily 08/11/19  02/09/20  [provider]  furosemide (LASIX) 40 MG tablet Take 1 tablet (40 mg total) by mouth daily. 07/25/19 02/09/20  Ladona Horns, MD    Allergies    Patient has no known allergies.  Review of Systems   Review of Systems  All other systems reviewed and are negative.   Physical Exam Updated Vital Signs BP 133/83 (BP Location: Right Arm)   Pulse 85   Temp (!) 97.5 F (36.4 C) (Oral)   Resp 20   SpO2 98%   Physical Exam Vitals and nursing note reviewed.  Constitutional:      Appearance: He is well-developed and well-nourished.  HENT:     Head: Normocephalic.  Eyes:     Extraocular Movements: EOM normal.  Cardiovascular:     Rate and Rhythm: Normal rate.  Pulmonary:     Effort: Pulmonary effort is normal.  Abdominal:     General: There is no distension.  Musculoskeletal:        General: Tenderness present. No swelling. Normal range of motion.     Cervical back: Normal range of motion.  Skin:    General: Skin is warm.  Neurological:     General: No focal deficit present.     Mental Status: He is alert and oriented to person, place, and time.  Psychiatric:        Mood and Affect: Mood and affect and mood normal.     ED Results / Procedures / Treatments   Labs (all labs ordered are listed, but only abnormal results are displayed) Labs Reviewed - No data to display  EKG None  Radiology No results found.  Procedures Procedures   Medications Ordered in ED Medications - No data to display  ED Course  I have reviewed the triage vital signs and the nursing notes.  Pertinent labs & imaging results that were available during my care of the patient were reviewed by me and considered in my medical decision making (see chart for details).    MDM Rules/Calculators/A&P                          MDM: Pt advised he needs to see his primary MD for recheck.  I suspect neuropathy.  Final Clinical Impression(s) / ED Diagnoses Final diagnoses:  Foot pain,  left  Foot pain, right    Rx / DC Orders ED Discharge Orders         Ordered    HYDROcodone-acetaminophen (NORCO/VICODIN) 5-325 MG tablet  Every 4 hours PRN        05/29/20 1130        An After Visit Summary was printed and given to the patient.    Fransico Meadow, PA-C 05/29/20 [place], [place], DO 05/29/20 1157

## 2020-05-29 NOTE — Discharge Instructions (Signed)
Follow up at [facility].

## 2020-06-01 ENCOUNTER — Telehealth (INDEPENDENT_AMBULATORY_CARE_PROVIDER_SITE_OTHER): Payer: Self-pay

## 2020-06-01 NOTE — Telephone Encounter (Signed)
Copied from [place] [id number]. Topic: General - Inquiry >> May 31, 2020  9:42 AM Greggory Keen D wrote: Pt called asking for an appt to see [facility] for feet pain.  There was not an appt until Feb 15th.  He states he went to the ER recently and they prescrib  Hydrocodone 325  CB#  He does not know his number but it should be in the files   per pt.

## 2020-06-04 ENCOUNTER — Other Ambulatory Visit (INDEPENDENT_AMBULATORY_CARE_PROVIDER_SITE_OTHER): Payer: Self-pay | Admitting: Primary Care

## 2020-06-04 ENCOUNTER — Encounter (HOSPITAL_COMMUNITY): Payer: Self-pay | Admitting: Emergency Medicine

## 2020-06-04 ENCOUNTER — Other Ambulatory Visit: Payer: Self-pay

## 2020-06-04 ENCOUNTER — Ambulatory Visit (INDEPENDENT_AMBULATORY_CARE_PROVIDER_SITE_OTHER)
Admission: EM | Admit: 2020-06-04 | Discharge: 2020-06-04 | Disposition: A | Discharge status: Home / Self Care | Payer: Medicare Other | Source: Home / Self Care

## 2020-06-04 ENCOUNTER — Encounter (HOSPITAL_COMMUNITY): Payer: Self-pay

## 2020-06-04 ENCOUNTER — Telehealth (INDEPENDENT_AMBULATORY_CARE_PROVIDER_SITE_OTHER): Payer: Self-pay

## 2020-06-04 ENCOUNTER — Emergency Department (HOSPITAL_COMMUNITY)
Admission: EM | Admit: 2020-06-04 | Discharge: 2020-06-04 | Disposition: A | Discharge status: Home / Self Care | Payer: Medicare Other | Attending: Emergency Medicine | Admitting: Emergency Medicine

## 2020-06-04 DIAGNOSIS — Z5321 Procedure and treatment not carried out due to patient leaving prior to being seen by health care provider: Secondary | ICD-10-CM | POA: Diagnosis not present

## 2020-06-04 DIAGNOSIS — M79672 Pain in left foot: Secondary | ICD-10-CM | POA: Insufficient documentation

## 2020-06-04 DIAGNOSIS — Z760 Encounter for issue of repeat prescription: Secondary | ICD-10-CM | POA: Insufficient documentation

## 2020-06-04 DIAGNOSIS — M79671 Pain in right foot: Secondary | ICD-10-CM | POA: Diagnosis not present

## 2020-06-04 DIAGNOSIS — G894 Chronic pain syndrome: Secondary | ICD-10-CM

## 2020-06-04 DIAGNOSIS — B351 Tinea unguium: Secondary | ICD-10-CM

## 2020-06-04 LAB — CBC WITH DIFFERENTIAL/PLATELET
Abs Immature Granulocytes: 0.08 10*3/uL — ABNORMAL HIGH (ref 0.00–0.07)
Basophils Absolute: 0 10*3/uL (ref 0.0–0.1)
Basophils Relative: 0 %
Eosinophils Absolute: 0.1 10*3/uL (ref 0.0–0.5)
Eosinophils Relative: 1 %
HCT: 28.9 % — ABNORMAL LOW (ref 39.0–52.0)
Hemoglobin: 9 g/dL — ABNORMAL LOW (ref 13.0–17.0)
Immature Granulocytes: 1 %
Lymphocytes Relative: 30 %
Lymphs Abs: 2 10*3/uL (ref 0.7–4.0)
MCH: 34.7 pg — ABNORMAL HIGH (ref 26.0–34.0)
MCHC: 31.1 g/dL (ref 30.0–36.0)
MCV: 111.6 fL — ABNORMAL HIGH (ref 80.0–100.0)
Monocytes Absolute: 0.7 10*3/uL (ref 0.1–1.0)
Monocytes Relative: 10 %
Neutro Abs: 3.8 10*3/uL (ref 1.7–7.7)
Neutrophils Relative %: 58 %
Platelets: 89 10*3/uL — ABNORMAL LOW (ref 150–400)
RBC: 2.59 MIL/uL — ABNORMAL LOW (ref 4.22–5.81)
RDW: 17.6 % — ABNORMAL HIGH (ref 11.5–15.5)
WBC: 6.7 10*3/uL (ref 4.0–10.5)
nRBC: 0.3 % — ABNORMAL HIGH (ref 0.0–0.2)

## 2020-06-04 LAB — URIC ACID: Uric Acid, Serum: 9.9 mg/dL — ABNORMAL HIGH (ref 3.7–8.6)

## 2020-06-04 MED ORDER — TRIAMCINOLONE ACETONIDE 40 MG/ML IJ SUSP
INTRAMUSCULAR | Status: AC
  Filled 2020-06-04: qty 1

## 2020-06-04 MED ORDER — TRIAMCINOLONE ACETONIDE 40 MG/ML IJ SUSP
40.0000 mg | Freq: Once | INTRAMUSCULAR | Status: AC
  Administered 2020-06-04: 40 mg via INTRAMUSCULAR

## 2020-06-04 MED ORDER — TERBINAFINE HCL 1 % EX CREA: 1.0000 "application " | TOPICAL_CREAM | Freq: Two times a day (BID) | CUTANEOUS | 1 refills | Status: DC

## 2020-06-04 NOTE — ED Provider Notes (Signed)
[facility]    CSN: [id number] Arrival date & time: 06/04/20  1028      History   Chief Complaint Chief Complaint  Patient presents with  . Leg Pain  . Foot Pain    HPI Daniel [name] is a 60 y.o. male.   Here today following up on his chronic b/l severe foot pain. States the pain is 10/10 and burning and sharp in quality. Denies injury to the area and has been seen numerous times for this in the ER. Awaiting appt with PCP now to get referred to Pain mgmt. Hx of DM, ESRD on dialysis, HTN, etc. On gabapentin and has been taking norco, oxycodone, hydromorphone as able the past few months for this issue but states PCP refusing to refill his norco last given 1 week ago in ED and he is out. Requesting refill today. Denies fever, chills, known foot wounds, hx of gout.      Past Medical History:  Diagnosis Date  . Anemia   . Diabetes mellitus without complication ([place])    patient denies  . Dialysis patient ([place])   . End stage chronic kidney disease ([place])   . Hypertension   . ICH (intracerebral hemorrhage) ([place]) 05/20/2017  . Shoulder pain, left 06/28/2013    Patient Active Problem List   Diagnosis Date Noted  . ESRD (end stage renal disease) ([place])   . AVM (arteriovenous malformation) of small bowel, acquired with hemorrhage   . Symptomatic anemia 07/31/2019  . Acute on chronic anemia 07/23/2019  . Chronic kidney disease, stage V ([place])   . Chronic hepatitis C without hepatic coma ([place]) 07/11/2019  . Iron deficiency anemia 03/30/2019  . CKD (chronic kidney disease), stage V ([place]) 03/30/2019  . Alcohol use disorder 03/30/2019  . B12 deficiency 03/30/2019  . Orthostatic hypotension 01/22/2019  . Anemia   . Hypertension 06/29/2016  . DM2 (diabetes mellitus, type 2) ([place]) 06/29/2016    Past Surgical History:  Procedure Laterality Date  . APPENDECTOMY    . AV FISTULA PLACEMENT Left 08/03/2019   Procedure: LEFT ARM ARTERIOVENOUS (AV) CEPHALIC  FISTULA CREATION;   Surgeon: Waynetta Sandy, MD;  Location: [place];  Service: Vascular;  Laterality: Left;  . BIOPSY  06/30/2019   Procedure: BIOPSY;  Surgeon: Ronald Lobo, MD;  Location: [place];  Service: Endoscopy;;  . BIOPSY  08/02/2019   Procedure: BIOPSY;  Surgeon: Yetta Flock, MD;  Location: [place];  Service: Gastroenterology;;  . COLONOSCOPY  01/23/2012   Procedure: COLONOSCOPY;  Surgeon: Danie Binder, MD;  [facility];  Service: Endoscopy;  Laterality: N/A;  11:10 AM  . COLONOSCOPY WITH PROPOFOL N/A 06/30/2019   Procedure: COLONOSCOPY WITH PROPOFOL;  Surgeon: Ronald Lobo, MD;  Location: [place];  Service: Endoscopy;  Laterality: N/A;  . ENTEROSCOPY N/A 08/02/2019   Procedure: ENTEROSCOPY;  Surgeon: Yetta Flock, MD;  Location: [facility] ENDOSCOPY;  Service: Gastroenterology;  Laterality: N/A;  . ESOPHAGOGASTRODUODENOSCOPY (EGD) WITH PROPOFOL N/A 06/30/2019   Procedure: ESOPHAGOGASTRODUODENOSCOPY (EGD) WITH PROPOFOL;  Surgeon: Ronald Lobo, MD;  Location: [place];  Service: Endoscopy;  Laterality: N/A;  . FISTULA SUPERFICIALIZATION Left 10/17/2019   Procedure: LEFT UPPER EXTREMITY FISTULA REVISION, SIDE BRANCH LIGATION,  AND SUPERFICIALIZATION;  Surgeon: Marty Heck, MD;  Location: [place];  Service: Vascular;  Laterality: Left;  . GIVENS CAPSULE STUDY N/A 06/30/2019   Procedure: GIVENS CAPSULE STUDY;  Surgeon: Ronald Lobo, MD;  Location: [facility] ENDOSCOPY;  Service: Endoscopy;  Laterality: N/A;  .  HEMOSTASIS CONTROL  08/02/2019   Procedure: HEMOSTASIS CONTROL;  Surgeon: Yetta Flock, MD;  Location: [facility] ENDOSCOPY;  Service: Gastroenterology;;  . INCISION AND DRAINAGE ABSCESS N/A 06/29/2016   Procedure: INCISION AND DRAINAGE ABDOMINAL WALL ABSCESS;  Surgeon: Alphonsa Overall, MD;  [facility];  Service: General;  Laterality: N/A;  . INSERTION OF DIALYSIS CATHETER Right 08/03/2019   Procedure: INSERTION OF DIALYSIS CATHETER;  Surgeon: Waynetta Sandy, MD;  Location: [place];  Service: Vascular;  Laterality: Right;  . INSERTION OF DIALYSIS CATHETER Right 10/22/2019   Procedure: INSERTION OF 23CM TUNNELED DIALYSIS CATHETER RIGHT INTERNAL JUGULAR;  Surgeon: Angelia Mould, MD;  Location: [place];  Service: Vascular;  Laterality: Right;  . Left heel surgery    . PENILE BIOPSY N/A 03/26/2020   Procedure: PENILE ULCER DEBRIDEMENT;  Surgeon: Remi Haggard, MD;  [facility];  Service: Urology;  Laterality: N/A;  30 MINS       Home Medications    Prior to Admission medications   Medication Sig Start Date End Date Taking? Authorizing Provider  HYDROcodone-acetaminophen (NORCO/VICODIN) 5-325 MG tablet Take 1 tablet by mouth every 4 (four) hours as needed for moderate pain. 05/29/20 05/29/21 Yes Caryl Ada K, PA-C  terbinafine (LAMISIL) 1 % cream Apply 1 application topically 2 (two) times daily. 06/04/20  Yes Volney American, PA-C  amLODipine (NORVASC) 10 MG tablet Take 10 mg by mouth daily.    [provider]  blood glucose meter kit and supplies KIT Dispense based on patient and insurance preference. Use up to four times daily as directed. (FOR ICD-9 250.00, 250.01). 07/01/16   Nita Sells, MD  Calcium Acetate 667 MG TABS Take 667-1,334 mg by mouth See admin instructions. Take 667 mg in the morning and afternoon and 1,334 in the evening 03/20/20   [provider]  cetirizine (ZYRTEC ALLERGY) 10 MG tablet Take 1 tablet (10 mg total) by mouth daily. 02/23/20   [name], NP  cloNIDine (CATAPRES) 0.2 MG tablet Take  1 tablet twice daily . If makes drowsy take 2 tablets - after he is off of work Patient taking differently: Take 0.2 mg by mouth See admin instructions. If makes drowsy take 2 tablets - after he is off of work 07/13/19   [name], NP  gabapentin (NEURONTIN) 300 MG capsule Take 1 capsule (300 mg total) by mouth 3 (three) times daily. Take 342m twice daily and 6018mat  bedtime. Patient taking differently: Take 300-600 mg by mouth See admin instructions. Take 30062mwice daily and 600m53m bedtime. 02/23/20   EdwaKerin Perna  HYDROmorphone (DILAUDID) 2 MG tablet Take 1 tablet (2 mg total) by mouth every 4 (four) hours as needed for severe pain. 05/20/20   FloyDeno Etienne  hydrOXYzine (ATARAX/VISTARIL) 25 MG tablet Take 1 tablet (25 mg total) by mouth 2 (two) times daily as needed. Patient taking differently: Take 25 mg by mouth 2 (two) times daily as needed (Pain). 02/23/20   EdwaKerin Perna  lidocaine-prilocaine (EMLA) cream Apply 1 application topically daily as needed. 01/24/20   [provider]  mupirocin cream (BACTROBAN) 2 % Apply 1 application topically 2 (two) times daily. 04/30/20   HaviIsla Pence  mupirocin ointment (BACTROBAN) 2 % Apply 1 application topically 2 (two) times daily. 02/23/20   HaglVanessa Kick  oxyCODONE-acetaminophen (PERCOCET) 5-325 MG tablet Take 1 tablet by mouth every 4 (four) hours as needed for severe pain. 03/26/20  03/26/21  Remi Haggard, MD  oxyCODONE-acetaminophen (PERCOCET/ROXICET) 5-325 MG tablet Take 1 tablet by mouth every 4 (four) hours as needed. 03/21/20   [provider]  Pramoxine HCl 0.5 % OINT Apply 1 application topically 4 (four) times daily. 04/30/20   Isla Pence, MD  sulfamethoxazole-trimethoprim (BACTRIM) 400-80 MG tablet Take 1 tablet by mouth 2 (two) times daily. 04/30/20   Isla Pence, MD  triamcinolone (KENALOG) 0.1 % Apply 1 application topically 2 (two) times daily.    [provider]  ferrous sulfate 325 (65 FE) MG tablet SMARTSIG:1 Tablet(s) By Mouth Twice Daily 08/11/19 02/09/20  [provider]  furosemide (LASIX) 40 MG tablet Take 1 tablet (40 mg total) by mouth daily. 07/25/19 02/09/20  Ladona Horns, MD    Family History Family History  Problem Relation Age of Onset  . Colon cancer Neg Hx     Social History Social History   Tobacco  Use  . Smoking status: Current Every Day Smoker    Packs/day: 1.00    Years: 30.00    Pack years: 30.00    Types: Cigarettes  . Smokeless tobacco: Never Used  Vaping Use  . Vaping Use: Never used  Substance Use Topics  . Alcohol use: Not Currently    Alcohol/week: 12.0 standard drinks    Types: 12 Cans of beer per week  . Drug use: Not Currently    Types: "Crack" cocaine    Comment: last in 2020     Allergies   Patient has no known allergies.   Review of Systems Review of Systems PER HPI   Physical Exam Triage Vital Signs ED Triage Vitals  Enc Vitals Group     BP 06/04/20 1235 (!) 177/96     Pulse Rate 06/04/20 1235 90     Resp 06/04/20 1235 (!) 22     Temp 06/04/20 1235 97.9 F (36.6 C)     Temp Source 06/04/20 1235 Oral     SpO2 06/04/20 1235 96 %     Weight --      Height --      Head Circumference --      Peak Flow --      Pain Score 06/04/20 1233 10     Pain Loc --      Pain Edu? --      Excl. in [place]? --    No data found.  Updated Vital Signs BP (!) 177/96 (BP Location: Right Arm)   Pulse 90   Temp 97.9 F (36.6 C) (Oral)   Resp (!) 22   SpO2 96%   Visual Acuity Right Eye Distance:   Left Eye Distance:   Bilateral Distance:    Right Eye Near:   Left Eye Near:    Bilateral Near:     Physical Exam Vitals and nursing note reviewed.  Constitutional:      Appearance: Normal appearance.  HENT:     Head: Atraumatic.  Eyes:     Extraocular Movements: Extraocular movements intact.     Conjunctiva/sclera: Conjunctivae normal.  Cardiovascular:     Rate and Rhythm: Normal rate and regular rhythm.  Pulmonary:     Effort: Pulmonary effort is normal.     Breath sounds: Normal breath sounds.  Musculoskeletal:        General: Swelling (trace edema b/l feet) and tenderness (b/l feet diffusely ttp) present. Normal range of motion.     Cervical back: Normal range of motion and neck supple.  Skin:  General: Skin is warm and dry.     Findings:  Erythema (mild erythema at toes diffusely b/l feet) present.     Comments: Peeling skin, thickened toenails b/l distal foot. No ulcers or open wounds noted  Neurological:     General: No focal deficit present.     Mental Status: He is oriented to person, place, and time.  Psychiatric:        Mood and Affect: Mood normal.        Thought Content: Thought content normal.        Judgment: Judgment normal.      UC Treatments / Results  Labs (all labs ordered are listed, but only abnormal results are displayed) Labs Reviewed  CBC WITH DIFFERENTIAL/PLATELET  URIC ACID    EKG   Radiology No results found.  Procedures Procedures (including critical care time)  Medications Ordered in UC Medications  triamcinolone acetonide (KENALOG-40) injection 40 mg (40 mg Intramuscular Given 06/04/20 1348)    Initial Impression / Assessment and Plan / UC Course  I have reviewed the triage vital signs and the nursing notes.  Pertinent labs & imaging results that were available during my care of the patient were reviewed by me and considered in my medical decision making (see chart for details).     Discussed with patient that we do not manage chronic pain conditions in this setting and would not be able to refill his narcotic pain medication. Per PDMP he has filled numerous scripts for narcotic pain medication over the last several months and agree with Pain Mgmt consultation which it appears PCP is currently arranging. Will treat for fungal infection and run labs to r/o gouty arthritis but agree with ED provider that likely neuropathy is to blame for his severe pain. Continue gabapentin, will give IM kenalog to see if this provides any relief. F/u with PCP as soon as able for recheck.   Final Clinical Impressions(s) / UC Diagnoses   Final diagnoses:  Bilateral foot pain  Onychomycosis   Discharge Instructions   None    ED Prescriptions    Medication Sig Dispense Auth. Provider    terbinafine (LAMISIL) 1 % cream Apply 1 application topically 2 (two) times daily. 90 g Volney American, [place]     I have reviewed the PDMP during this encounter.   Volney American, [place] 06/04/20 1550

## 2020-06-04 NOTE — ED Notes (Signed)
Pt states he does not want to stay and wait and not be able to get med refill so he is going to leave.

## 2020-06-04 NOTE — Telephone Encounter (Signed)
Please refer patient to pain management.

## 2020-06-04 NOTE — Telephone Encounter (Signed)
Patient came into the clinic requesting a referral to pain management.   Please advice [phone]

## 2020-06-04 NOTE — Telephone Encounter (Signed)
Do not prescribe narcotic sent to pain management

## 2020-06-04 NOTE — ED Triage Notes (Signed)
Pt requesting oxycodone refill that he got here on 2/1 for bilateral foot pain.

## 2020-06-04 NOTE — ED Triage Notes (Signed)
Pt presents with bilateral foot and leg pain x 1 month. Pt states he is not aware of what caused the pain. States he notices swelling that comes and goes. Pt states he needs  refilled. Pt states he needs a refill on HYDROcodone.

## 2020-06-07 ENCOUNTER — Telehealth: Payer: Self-pay | Admitting: Podiatry

## 2020-06-07 ENCOUNTER — Other Ambulatory Visit: Payer: Self-pay | Admitting: Sports Medicine

## 2020-06-07 ENCOUNTER — Ambulatory Visit (INDEPENDENT_AMBULATORY_CARE_PROVIDER_SITE_OTHER): Payer: Medicare Other

## 2020-06-07 ENCOUNTER — Encounter: Payer: Self-pay | Admitting: Podiatry

## 2020-06-07 ENCOUNTER — Other Ambulatory Visit: Payer: Self-pay

## 2020-06-07 ENCOUNTER — Ambulatory Visit (INDEPENDENT_AMBULATORY_CARE_PROVIDER_SITE_OTHER): Payer: Medicare Other | Admitting: Podiatry

## 2020-06-07 ENCOUNTER — Other Ambulatory Visit: Payer: Self-pay | Admitting: Podiatry

## 2020-06-07 DIAGNOSIS — M779 Enthesopathy, unspecified: Secondary | ICD-10-CM

## 2020-06-07 DIAGNOSIS — I999 Unspecified disorder of circulatory system: Secondary | ICD-10-CM

## 2020-06-07 DIAGNOSIS — M79672 Pain in left foot: Secondary | ICD-10-CM

## 2020-06-07 DIAGNOSIS — M79671 Pain in right foot: Secondary | ICD-10-CM

## 2020-06-07 DIAGNOSIS — M778 Other enthesopathies, not elsewhere classified: Secondary | ICD-10-CM | POA: Diagnosis not present

## 2020-06-07 MED ORDER — TRAMADOL HCL 50 MG PO TABS
50.0000 mg | ORAL_TABLET | Freq: Three times a day (TID) | ORAL | 0 refills | Status: AC
Start: 2020-06-07 — End: 2020-06-12

## 2020-06-07 MED ORDER — TRIAMCINOLONE ACETONIDE 10 MG/ML IJ SUSP
10.0000 mg | Freq: Once | INTRAMUSCULAR | Status: AC
  Administered 2020-06-07: 10 mg

## 2020-06-07 MED ORDER — TRAMADOL HCL 50 MG PO TABS: 50.0000 mg | ORAL_TABLET | Freq: Three times a day (TID) | ORAL | 2 refills | Status: DC

## 2020-06-07 NOTE — Progress Notes (Signed)
Sent Tramadol to pharmacy

## 2020-06-07 NOTE — Telephone Encounter (Signed)
Pt states pharmacy never sent in Rx. Please advise.

## 2020-06-07 NOTE — Telephone Encounter (Signed)
Sent Tramadol to his pharmacy

## 2020-06-08 ENCOUNTER — Ambulatory Visit (INDEPENDENT_AMBULATORY_CARE_PROVIDER_SITE_OTHER): Payer: Medicare Other | Admitting: Primary Care

## 2020-06-08 NOTE — Progress Notes (Signed)
Subjective:   Patient ID: Daniel [name], male   DOB: 60 y.o.   MRN: [id number]   HPI Patient presents stating that he has had a lot of pain in the bottom of both his feet and he gets discomfort also in his legs at times he does smoke approximately a pack a day and is no longer working.  States that he is also concerned about circulation and just is not sure as to what is causing amount of pain he is experiencing   Review of Systems  All other systems reviewed and are negative.       Objective:  Physical Exam Vitals and nursing note reviewed.  Constitutional:      Appearance: He is well-developed and well-nourished.  Cardiovascular:     Pulses: Intact distal pulses.  Pulmonary:     Effort: Pulmonary effort is normal.  Musculoskeletal:        General: Normal range of motion.  Skin:    General: Skin is warm.  Neurological:     Mental Status: He is alert.     Vascular status found to be diminished with diminished pulses PT and DP bilateral.  Patient is noted to have exquisite discomfort in the forefoot mostly around the second and third metatarsal phalangeal joints with fluid buildup around the joint surfaces that are painful and has generalized foot pain nondescript in nature extending from the ankle distal.  Also gets some cramping in his legs.  Patient did have reasonably good digital perfusion warm feet and he is well oriented x3     Assessment:  Inflammatory capsulitis which appears to be present bilateral with also distinct possibility for vascular disease or other systemic pathology     Plan:  H&P reviewed condition and I have ordered vascular studies to be done on him with the possibility that that might be of benefit.  I did go ahead today I did sterile prep and I injected the second and third metatarsal phalangeal joints bilateral with a combination of 3 mg dexamethasone 2 mg Kenalog and I advised on supportive therapy.  Patient was given a prescription for tramadol  which she can utilize and will be seen back after he has circulatory studies to see if there is any other ways we can be of benefit to him  X-rays indicate bone structure for the most part looks good mild arthritis no indications of advanced condition

## 2020-06-15 ENCOUNTER — Other Ambulatory Visit: Payer: Self-pay | Admitting: Podiatry

## 2020-06-15 DIAGNOSIS — I999 Unspecified disorder of circulatory system: Secondary | ICD-10-CM

## 2020-06-15 DIAGNOSIS — I739 Peripheral vascular disease, unspecified: Secondary | ICD-10-CM

## 2020-06-18 ENCOUNTER — Ambulatory Visit (HOSPITAL_COMMUNITY): Admission: RE | Admit: 2020-06-18 | Payer: Medicare Other | Source: Ambulatory Visit

## 2020-06-26 ENCOUNTER — Ambulatory Visit (INDEPENDENT_AMBULATORY_CARE_PROVIDER_SITE_OTHER): Payer: Medicare Other | Admitting: Primary Care

## 2020-06-29 ENCOUNTER — Ambulatory Visit (HOSPITAL_COMMUNITY)
Admission: RE | Admit: 2020-06-29 | Payer: Medicare Other | Source: Ambulatory Visit | Attending: Podiatry | Admitting: Podiatry

## 2020-07-02 ENCOUNTER — Emergency Department (HOSPITAL_COMMUNITY)
Admission: EM | Admit: 2020-07-02 | Discharge: 2020-07-03 | Disposition: A | Discharge status: Home / Self Care | Payer: Medicare Other | Attending: Emergency Medicine | Admitting: Emergency Medicine

## 2020-07-02 ENCOUNTER — Other Ambulatory Visit: Payer: Self-pay

## 2020-07-02 ENCOUNTER — Encounter (HOSPITAL_COMMUNITY): Payer: Self-pay

## 2020-07-02 DIAGNOSIS — Z79899 Other long term (current) drug therapy: Secondary | ICD-10-CM | POA: Insufficient documentation

## 2020-07-02 DIAGNOSIS — N186 End stage renal disease: Secondary | ICD-10-CM | POA: Diagnosis not present

## 2020-07-02 DIAGNOSIS — M79672 Pain in left foot: Secondary | ICD-10-CM | POA: Insufficient documentation

## 2020-07-02 DIAGNOSIS — F17210 Nicotine dependence, cigarettes, uncomplicated: Secondary | ICD-10-CM | POA: Diagnosis not present

## 2020-07-02 DIAGNOSIS — M79671 Pain in right foot: Secondary | ICD-10-CM | POA: Insufficient documentation

## 2020-07-02 DIAGNOSIS — M25551 Pain in right hip: Secondary | ICD-10-CM | POA: Insufficient documentation

## 2020-07-02 DIAGNOSIS — I120 Hypertensive chronic kidney disease with stage 5 chronic kidney disease or end stage renal disease: Secondary | ICD-10-CM | POA: Insufficient documentation

## 2020-07-02 DIAGNOSIS — E119 Type 2 diabetes mellitus without complications: Secondary | ICD-10-CM | POA: Insufficient documentation

## 2020-07-02 DIAGNOSIS — L299 Pruritus, unspecified: Secondary | ICD-10-CM | POA: Diagnosis present

## 2020-07-02 DIAGNOSIS — E790 Hyperuricemia without signs of inflammatory arthritis and tophaceous disease: Secondary | ICD-10-CM | POA: Insufficient documentation

## 2020-07-02 DIAGNOSIS — Z992 Dependence on renal dialysis: Secondary | ICD-10-CM | POA: Diagnosis not present

## 2020-07-02 NOTE — ED Triage Notes (Signed)
Pt reports that he has been itching all over for a long time and nothing makes it stop, pt also reports that he has been having pain in his R hip that shoots down his R leg since yesterday, hurts so bad he can barely move it.

## 2020-07-02 NOTE — ED Notes (Signed)
Patient ambulatory to and from restroom with steady gait.

## 2020-07-03 DIAGNOSIS — L299 Pruritus, unspecified: Secondary | ICD-10-CM | POA: Diagnosis not present

## 2020-07-03 LAB — COMPREHENSIVE METABOLIC PANEL
ALT: 21 U/L (ref 0–44)
AST: 89 U/L — ABNORMAL HIGH (ref 15–41)
Albumin: 2.7 g/dL — ABNORMAL LOW (ref 3.5–5.0)
Alkaline Phosphatase: 53 U/L (ref 38–126)
Anion gap: 16 — ABNORMAL HIGH (ref 5–15)
BUN: 63 mg/dL — ABNORMAL HIGH (ref 6–20)
CO2: 18 mmol/L — ABNORMAL LOW (ref 22–32)
Calcium: 8.6 mg/dL — ABNORMAL LOW (ref 8.9–10.3)
Chloride: 104 mmol/L (ref 98–111)
Creatinine, Ser: 10.02 mg/dL — ABNORMAL HIGH (ref 0.61–1.24)
GFR, Estimated: 5 mL/min — ABNORMAL LOW (ref >60)
Glucose, Bld: 100 mg/dL — ABNORMAL HIGH (ref 70–99)
Potassium: 3.9 mmol/L (ref 3.5–5.1)
Sodium: 138 mmol/L (ref 135–145)
Total Bilirubin: 0.7 mg/dL (ref 0.3–1.2)
Total Protein: 7.5 g/dL (ref 6.5–8.1)

## 2020-07-03 LAB — CBC WITH DIFFERENTIAL/PLATELET
Abs Immature Granulocytes: 0.02 10*3/uL (ref 0.00–0.07)
Basophils Absolute: 0 10*3/uL (ref 0.0–0.1)
Basophils Relative: 0 %
Eosinophils Absolute: 0.1 10*3/uL (ref 0.0–0.5)
Eosinophils Relative: 1 %
HCT: 27.6 % — ABNORMAL LOW (ref 39.0–52.0)
Hemoglobin: 9.1 g/dL — ABNORMAL LOW (ref 13.0–17.0)
Immature Granulocytes: 0 %
Lymphocytes Relative: 41 %
Lymphs Abs: 2.5 10*3/uL (ref 0.7–4.0)
MCH: 37.8 pg — ABNORMAL HIGH (ref 26.0–34.0)
MCHC: 33 g/dL (ref 30.0–36.0)
MCV: 114.5 fL — ABNORMAL HIGH (ref 80.0–100.0)
Monocytes Absolute: 0.6 10*3/uL (ref 0.1–1.0)
Monocytes Relative: 10 %
Neutro Abs: 2.8 10*3/uL (ref 1.7–7.7)
Neutrophils Relative %: 48 %
Platelets: 114 10*3/uL — ABNORMAL LOW (ref 150–400)
RBC: 2.41 MIL/uL — ABNORMAL LOW (ref 4.22–5.81)
RDW: 18.3 % — ABNORMAL HIGH (ref 11.5–15.5)
WBC: 6.1 10*3/uL (ref 4.0–10.5)
nRBC: 0 % (ref 0.0–0.2)

## 2020-07-03 LAB — URIC ACID: Uric Acid, Serum: 11.2 mg/dL — ABNORMAL HIGH (ref 3.7–8.6)

## 2020-07-03 LAB — PHOSPHORUS: Phosphorus: 7.8 mg/dL — ABNORMAL HIGH (ref 2.5–4.6)

## 2020-07-03 MED ORDER — TRAMADOL HCL 50 MG PO TABS
50.0000 mg | ORAL_TABLET | Freq: Once | ORAL | Status: AC
  Administered 2020-07-03: 50 mg via ORAL
  Filled 2020-07-03: qty 1

## 2020-07-03 MED ORDER — HYDROXYZINE HCL 10 MG PO TABS
10.0000 mg | ORAL_TABLET | Freq: Once | ORAL | Status: AC
  Administered 2020-07-03: 10 mg via ORAL
  Filled 2020-07-03: qty 1

## 2020-07-03 NOTE — ED Notes (Signed)
ED Provider at bedside. 

## 2020-07-03 NOTE — ED Provider Notes (Signed)
[facility] EMERGENCY DEPARTMENT Provider Note   CSN: [id number] Arrival date & time: 07/02/20  2220     History Chief Complaint  Patient presents with  . Pruritis  . Sciatica    Daniel [name] is a 60 y.o. male with a history of ESRD on HD (M/W/F), chronic hepatitis C, iron deficiency anemia, intracranial hemorrhage, HTN, AVM of the small bowel who presents the emergency department with a chief complaint of pruritus.  The patient reports that he was started on dialysis approximately 6 months ago.  Since that time, he has been having itching to his entire body that is almost constant.  He has attempted to take Benadryl in the past without improvement.  Tonight, he came to the ER for evaluation after he had a difficult time falling asleep due to the persistent itching.  He has also developed an associated rash with scabbing diffusely to his body.  No family members in his home have a similar rash.  Rash was not present before he started dialysis.  There is no concern for bedbugs exposure.  The patient also reports that he has been having pain in his bilateral feet for many, many months.  Pain is throbbing and constant.  No known aggravating or alleviating factors.  He has been seen and evaluated by podiatry and primary care multiple times for the same without improvement.  Symptoms are not worse tonight, but they have not resolved.   Triage note also mentions that the patient is having pain in his right hip that shoots down his right leg.  He denies this complaint to me.  No fevers, chills, nausea, vomiting, chest pain, shortness of breath, abdominal pain, diarrhea, constipation, weakness, numbness, headache, redness, warmth, or purulent drainage to the skin.  No other treatment prior to arrival.  No recent falls.  The history is provided by the patient and medical records. No language interpreter was used.       Past Medical History:  Diagnosis Date  . Anemia   .  Diabetes mellitus without complication ([place])    patient denies  . Dialysis patient ([place])   . End stage chronic kidney disease ([place])   . Hypertension   . ICH (intracerebral hemorrhage) ([place]) 05/20/2017  . Shoulder pain, left 06/28/2013    Patient Active Problem List   Diagnosis Date Noted  . ESRD (end stage renal disease) ([place])   . AVM (arteriovenous malformation) of small bowel, acquired with hemorrhage   . Symptomatic anemia 07/31/2019  . Acute on chronic anemia 07/23/2019  . Chronic kidney disease, stage V ([place])   . Chronic hepatitis C without hepatic coma ([place]) 07/11/2019  . Iron deficiency anemia 03/30/2019  . CKD (chronic kidney disease), stage V ([place]) 03/30/2019  . Alcohol use disorder 03/30/2019  . B12 deficiency 03/30/2019  . Orthostatic hypotension 01/22/2019  . Anemia   . Hypertension 06/29/2016  . DM2 (diabetes mellitus, type 2) ([place]) 06/29/2016    Past Surgical History:  Procedure Laterality Date  . APPENDECTOMY    . AV FISTULA PLACEMENT Left 08/03/2019   Procedure: LEFT ARM ARTERIOVENOUS (AV) CEPHALIC  FISTULA CREATION;  Surgeon: Waynetta Sandy, MD;  Location: [place];  Service: Vascular;  Laterality: Left;  . BIOPSY  06/30/2019   Procedure: BIOPSY;  Surgeon: Ronald Lobo, MD;  Location: [place];  Service: Endoscopy;;  . BIOPSY  08/02/2019   Procedure: BIOPSY;  Surgeon: Yetta Flock, MD;  Location: [place];  Service: Gastroenterology;;  . COLONOSCOPY  01/23/2012   Procedure: COLONOSCOPY;  Surgeon: Danie Binder, MD;  [facility];  Service: Endoscopy;  Laterality: N/A;  11:10 AM  . COLONOSCOPY WITH PROPOFOL N/A 06/30/2019   Procedure: COLONOSCOPY WITH PROPOFOL;  Surgeon: Ronald Lobo, MD;  Location: [place];  Service: Endoscopy;  Laterality: N/A;  . ENTEROSCOPY N/A 08/02/2019   Procedure: ENTEROSCOPY;  Surgeon: Yetta Flock, MD;  Location[facility]COPY;  Service: Gastroenterology;  Laterality: N/A;  .  ESOPHAGOGASTRODUODENOSCOPY (EGD) WITH PROPOFOL N/A 06/30/2019   Procedure: ESOPHAGOGASTRODUODENOSCOPY (EGD) WITH PROPOFOL;  Surgeon: Ronald Lobo, MD;  Location: [place];  Service: Endoscopy;  Laterality: N/A;  . FISTULA SUPERFICIALIZATION Left 10/17/2019   Procedure: LEFT UPPER EXTREMITY FISTULA REVISION, SIDE BRANCH LIGATION,  AND SUPERFICIALIZATION;  Surgeon: Marty Heck, MD;  Location: [place];  Service: Vascular;  Laterality: Left;  . GIVENS CAPSULE STUDY N/A 06/30/2019   Procedure: GIVENS CAPSULE STUDY;  Surgeon: Ronald Lobo, MD;  Location: [facility] ENDOSCOPY;  Service: Endoscopy;  Laterality: N/A;  . HEMOSTASIS CONTROL  08/02/2019   Procedure: HEMOSTASIS CONTROL;  Surgeon: Yetta Flock, MD;  Location: [place] [facility]OS[facility];  Service: Gastroenterology;;  . INCISION AND DRAINAGE ABSCESS N/A 06/29/2016   Procedure: INCISION AND DRAINAGE ABDOMINAL WALL ABSCESS;  Surgeon: Alphonsa Overall, MD;  Location: [facility];  Service: General;  Laterality: N/A;  . INSERTION OF DIALYSIS CATHETER Right 08/03/2019   Procedure: INSERTION OF DIALYSIS CATHETER;  Surgeon: Waynetta Sandy, MD;  Location: [place];  Service: Vascular;  Laterality: Right;  . INSERTION OF DIALYSIS CATHETER Right 10/22/2019   Procedure: INSERTION OF 23CM TUNNELED DIALYSIS CATHETER RIGHT INTERNAL JUGULAR;  Surgeon: Angelia Mould, MD;  Location: [place];  Service: Vascular;  Laterality: Right;  . Left heel surgery    . PENILE BIOPSY N/A 03/26/2020   Procedure: PENILE ULCER DEBRIDEMENT;  Surgeon: Remi Haggard, MD;  Location: [facility];  Service: Urology;  Laterality: N/A;  30 MINS       Family History  Problem Relation Age of Onset  . Colon cancer Neg Hx     Social History   Tobacco Use  . Smoking status: Current Every Day Smoker    Packs/day: 1.00    Years: 30.00    Pack years: 30.00    Types: Cigarettes  . Smokeless tobacco: Never Used  Vaping Use  . Vaping Use: Never used  Substance Use Topics  . Alcohol  use: Not Currently    Alcohol/week: 12.0 standard drinks    Types: 12 Cans of beer per week  . Drug use: Not Currently    Types: "Crack" cocaine    Comment: last in 2020    Home Medications Prior to Admission medications   Medication Sig Start Date End Date Taking? Authorizing Provider  amLODipine (NORVASC) 10 MG tablet Take 10 mg by mouth daily.    [provider]  blood glucose meter kit and supplies KIT Dispense based on patient and insurance preference. Use up to four times daily as directed. (FOR ICD-9 250.00, 250.01). 07/01/16   Nita Sells, MD  Calcium Acetate 667 MG TABS Take 667-1,334 mg by mouth See admin instructions. Take 667 mg in the morning and afternoon and 1,334 in the evening 03/20/20   [provider]  cetirizine (ZYRTEC ALLERGY) 10 MG tablet Take 1 tablet (10 mg total) by mouth daily. 02/23/20   [name], NP  cloNIDine (CATAPRES) 0.2 MG tablet Take  1 tablet twice daily . If makes drowsy take 2  tablets - after he is off of work Patient taking differently: Take 0.2 mg by mouth See admin instructions. If makes drowsy take 2 tablets - after he is off of work 07/13/19   [name], NP  gabapentin (NEURONTIN) 300 MG capsule Take 1 capsule (300 mg total) by mouth 3 (three) times daily. Take 392m twice daily and 6055mat bedtime. Patient taking differently: Take 300-600 mg by mouth See admin instructions. Take 30061mwice daily and 600m59m bedtime. 02/23/20   EdwaKerin Perna  HYDROcodone-acetaminophen (NORCO/VICODIN) 5-325 MG tablet Take 1 tablet by mouth every 4 (four) hours as needed for moderate pain. 05/29/20 05/29/21  SofiFransico Meadow-C  HYDROmorphone (DILAUDID) 2 MG tablet Take 1 tablet (2 mg total) by mouth every 4 (four) hours as needed for severe pain. 05/20/20   FloyDeno Etienne  hydrOXYzine (ATARAX/VISTARIL) 25 MG tablet Take 1 tablet (25 mg total) by mouth 2 (two) times daily as needed. Patient taking differently: Take  25 mg by mouth 2 (two) times daily as needed (Pain). 02/23/20   EdwaKerin Perna  lidocaine-prilocaine (EMLA) cream Apply 1 application topically daily as needed. 01/24/20   [provider]  mupirocin cream (BACTROBAN) 2 % Apply 1 application topically 2 (two) times daily. 04/30/20   HaviIsla Pence  mupirocin ointment (BACTROBAN) 2 % Apply 1 application topically 2 (two) times daily. 02/23/20   HaglVanessa Kick  oxyCODONE-acetaminophen (PERCOCET) 5-325 MG tablet Take 1 tablet by mouth every 4 (four) hours as needed for severe pain. 03/26/20 03/26/21  NewsRemi Haggard  oxyCODONE-acetaminophen (PERCOCET/ROXICET) 5-325 MG tablet Take 1 tablet by mouth every 4 (four) hours as needed. 03/21/20   [provider]  Pramoxine HCl 0.5 % OINT Apply 1 application topically 4 (four) times daily. 04/30/20   HaviIsla Pence  sulfamethoxazole-trimethoprim (BACTRIM) 400-80 MG tablet Take 1 tablet by mouth 2 (two) times daily. 04/30/20   HaviIsla Pence  terbinafine (LAMISIL) 1 % cream Apply 1 application topically 2 (two) times daily. 06/04/20   LaneVolney American-C  triamcinolone (KENALOG) 0.1 % Apply 1 application topically 2 (two) times daily.    [provider]  ferrous sulfate 325 (65 FE) MG tablet SMARTSIG:1 Tablet(s) By Mouth Twice Daily 08/11/19 02/09/20  [provider]  furosemide (LASIX) 40 MG tablet Take 1 tablet (40 mg total) by mouth daily. 07/25/19 02/09/20  JoneLadona Horns    Allergies    Patient has no known allergies.  Review of Systems   Review of Systems  Constitutional: Negative for appetite change and fever.  HENT: Negative for congestion and sore throat.   Eyes: Negative for visual disturbance.  Respiratory: Negative for shortness of breath.   Cardiovascular: Negative for chest pain.  Gastrointestinal: Negative for abdominal pain, diarrhea, nausea and vomiting.  Genitourinary: Negative for dysuria.  Musculoskeletal: Positive  for arthralgias and myalgias. Negative for back pain, gait problem, joint swelling, neck pain and neck stiffness.  Skin: Positive for rash and wound. Negative for color change.       Pruritus  Allergic/Immunologic: Negative for immunocompromised state.  Neurological: Negative for dizziness, seizures, weakness, numbness and headaches.  Psychiatric/Behavioral: Negative for confusion.    Physical Exam Updated Vital Signs BP (!) 168/84 (BP Location: Right Arm)   Pulse 90   Temp 98.4 F (36.9 C) (Oral)   Resp 18   SpO2 96%   Physical Exam Vitals and nursing note reviewed.  Constitutional:  General: He is not in acute distress.    Appearance: He is well-developed. He is not toxic-appearing.     Comments: Chronically ill-appearing.  No acute distress.  HENT:     Head: Normocephalic.  Eyes:     Conjunctiva/sclera: Conjunctivae normal.  Cardiovascular:     Rate and Rhythm: Normal rate and regular rhythm.     Heart sounds: No murmur heard.   Pulmonary:     Effort: Pulmonary effort is normal.  Abdominal:     General: There is no distension.     Palpations: Abdomen is soft. There is no mass.     Tenderness: There is no abdominal tenderness. There is no right CVA tenderness, left CVA tenderness, guarding or rebound.     Hernia: No hernia is present.     Comments: Abdomen is soft, nontender, nondistended.  Musculoskeletal:     Cervical back: Neck supple.     Comments: Dried, peeling skin noted to the bilateral soles of the feet.  No redness or warmth.  2 small areas of ecchymosis noted to the dorsum of the bilateral feet around the base of the third metatarsal bilaterally.  Full active and passive range of motion of all digits of the bilateral feet.  Neurovascular intact throughout the bilateral feet.  No swelling of the digits of the bilateral feet.  No redness, warmth to the joints of the bilateral feet.  Normal exam of the bilateral ankles.  Skin:    General: Skin is warm and  dry.     Findings: Rash present.     Comments: Brown-colored papular lesions with multiple healed liver scabs noted almost diffusely to the body, but rash is only located within areas that are able to be reached by the patient if he is scratching.  No dermatographia.  No erythema, edema, warmth, fluctuance, induration, red streaking.  No drainage from the scabs.  No vesicles, bulla.   Neurological:     Mental Status: He is alert.  Psychiatric:        Behavior: Behavior normal.     ED Results / Procedures / Treatments   Labs (all labs ordered are listed, but only abnormal results are displayed) Labs Reviewed  CBC WITH DIFFERENTIAL/PLATELET - Abnormal; Notable for the following components:      Result Value   RBC 2.41 (*)    Hemoglobin 9.1 (*)    HCT 27.6 (*)    MCV 114.5 (*)    MCH 37.8 (*)    RDW 18.3 (*)    Platelets 114 (*)    All other components within normal limits  URIC ACID - Abnormal; Notable for the following components:   Uric Acid, Serum 11.2 (*)    All other components within normal limits  COMPREHENSIVE METABOLIC PANEL - Abnormal; Notable for the following components:   CO2 18 (*)    Glucose, Bld 100 (*)    BUN 63 (*)    Creatinine, Ser 10.02 (*)    Calcium 8.6 (*)    Albumin 2.7 (*)    AST 89 (*)    GFR, Estimated 5 (*)    Anion gap 16 (*)    All other components within normal limits  PHOSPHORUS - Abnormal; Notable for the following components:   Phosphorus 7.8 (*)    All other components within normal limits    EKG None  Radiology No results found.  Procedures Procedures   Medications Ordered in ED Medications  traMADol (ULTRAM) tablet 50 mg (50 mg  Oral Given 07/03/20 0108)  hydrOXYzine (ATARAX/VISTARIL) tablet 10 mg (10 mg Oral Given 07/03/20 0108)    ED Course  I have reviewed the triage vital signs and the nursing notes.  Pertinent labs & imaging results that were available during my care of the patient were reviewed by me and considered in  my medical decision making (see chart for details).    MDM Rules/Calculators/A&P                          60 year old male with a history of ESRD on HD (M/W/F), chronic hepatitis C, iron deficiency anemia, intracranial hemorrhage, HTN, AVM of the small bowel who presents to the emergency department with multiple chronic complaints.  His primary concern is pruritus that has been going on for many months.  Notably, he reports that it began around the time that he was started on hemodialysis about 6 months ago.  The pruritus has been accompanied by an associated rash.  On exam, he has a papular rash with multiple scabs that is only noted on areas of the body that are able to be scratched by the patient.    He is also endorsing chronic bilateral foot pain that he has previously been evaluated by podiatry and family medicine for that has not improved.  No recent falls, injuries, or trauma.  The patient was discussed with Dr. Leonette Monarch, attending physician.  Vital signs are stable.  Labs have been ordered and independently reviewed by me. Phosphorus level is elevated at 7.8.  Serum uric acid level is elevated to 11.2.  He has anemia, but this is stable from previous.  No leukocytosis.  Otherwise, labs appear stable from previous.  I have reviewed the patient's medical record, including the x-rays of the patient's bilateral feet that were performed by podiatry last month.  I have a low suspicion for gout or septic joint.  There is no suggestion of abscess or cellulitis on his exam.  Doubt osteomyelitis.  No further work-up indicated.  Advised the patient follow-up with podiatry for reevaluation.  I suspect that the patient's itching and rash may be related to either his elevated phosphorus level or uric acid level since he is on dialysis.  After discussing with Dr. Leonette Monarch, the patient may benefit from being evaluated by his nephrologist in the clinic and to being started on a phosphorus binding agent.  I  did give the patient a dose of hydroxyzine in the ER and pruritus appeared improved on my evaluation.  However, since he is on hemodialysis we will avoid sending the patient home with a prescription of this medication today.  Patient was given tramadol in the ER for pain control with good improvement.  He will follow up with primary care.  At this time, the patient is hemodynamically stable and in no acute distress.  ER return precautions given.  Safe for discharge home with outpatient follow-up as indicated.   Final Clinical Impression(s) / ED Diagnoses Final diagnoses:  Serum phosphate elevated  Elevated uric acid in blood    Rx / DC Orders ED Discharge Orders    None       Joanne Gavel, PA-C 07/03/20 0430    Fatima Blank, MD 07/03/20 1800

## 2020-07-03 NOTE — Discharge Instructions (Signed)
Thank you for allowing me to care for you today in the Emergency Department.   You were seen today for itching.  This is also likely the cause of the rash that you have.  Your phosphorus and uric acid levels were high.  Please call to schedule a follow-up appointment with your nephrologist/kidney doctor to address these concerns as they are likely related to dialysis.  They might be able to adjust your medications to help with your symptoms.  Follow-up with your podiatrist if you continue to have foot pain.  You can take 650 mg of Tylenol once every 6 hours.  If you continue to have worsening pain, please address this with primary care.  Return to the emergency department if you become unable to walk, if you have high fevers, uncontrollable vomiting, or other new, concerning symptoms.

## 2020-07-04 ENCOUNTER — Encounter (HOSPITAL_COMMUNITY): Payer: Self-pay

## 2020-07-07 ENCOUNTER — Emergency Department (HOSPITAL_COMMUNITY): Payer: Medicare Other

## 2020-07-07 ENCOUNTER — Emergency Department (HOSPITAL_COMMUNITY)
Admission: EM | Admit: 2020-07-07 | Discharge: 2020-07-07 | Disposition: A | Discharge status: Home / Self Care | Payer: Medicare Other | Attending: Emergency Medicine | Admitting: Emergency Medicine

## 2020-07-07 DIAGNOSIS — R110 Nausea: Secondary | ICD-10-CM | POA: Diagnosis not present

## 2020-07-07 DIAGNOSIS — I120 Hypertensive chronic kidney disease with stage 5 chronic kidney disease or end stage renal disease: Secondary | ICD-10-CM | POA: Diagnosis not present

## 2020-07-07 DIAGNOSIS — Z79899 Other long term (current) drug therapy: Secondary | ICD-10-CM | POA: Diagnosis not present

## 2020-07-07 DIAGNOSIS — E1122 Type 2 diabetes mellitus with diabetic chronic kidney disease: Secondary | ICD-10-CM | POA: Diagnosis not present

## 2020-07-07 DIAGNOSIS — R0602 Shortness of breath: Secondary | ICD-10-CM | POA: Diagnosis not present

## 2020-07-07 DIAGNOSIS — R1032 Left lower quadrant pain: Secondary | ICD-10-CM

## 2020-07-07 DIAGNOSIS — Z992 Dependence on renal dialysis: Secondary | ICD-10-CM | POA: Diagnosis not present

## 2020-07-07 DIAGNOSIS — R197 Diarrhea, unspecified: Secondary | ICD-10-CM | POA: Insufficient documentation

## 2020-07-07 DIAGNOSIS — N186 End stage renal disease: Secondary | ICD-10-CM | POA: Diagnosis not present

## 2020-07-07 DIAGNOSIS — R059 Cough, unspecified: Secondary | ICD-10-CM | POA: Diagnosis not present

## 2020-07-07 DIAGNOSIS — Z20822 Contact with and (suspected) exposure to covid-19: Secondary | ICD-10-CM | POA: Diagnosis not present

## 2020-07-07 DIAGNOSIS — F17210 Nicotine dependence, cigarettes, uncomplicated: Secondary | ICD-10-CM | POA: Diagnosis not present

## 2020-07-07 DIAGNOSIS — R509 Fever, unspecified: Secondary | ICD-10-CM | POA: Diagnosis present

## 2020-07-07 LAB — COMPREHENSIVE METABOLIC PANEL
ALT: 26 U/L (ref 0–44)
AST: 93 U/L — ABNORMAL HIGH (ref 15–41)
Albumin: 2.7 g/dL — ABNORMAL LOW (ref 3.5–5.0)
Alkaline Phosphatase: 57 U/L (ref 38–126)
Anion gap: 9 (ref 5–15)
BUN: 30 mg/dL — ABNORMAL HIGH (ref 6–20)
CO2: 28 mmol/L (ref 22–32)
Calcium: 8.7 mg/dL — ABNORMAL LOW (ref 8.9–10.3)
Chloride: 99 mmol/L (ref 98–111)
Creatinine, Ser: 6.06 mg/dL — ABNORMAL HIGH (ref 0.61–1.24)
GFR, Estimated: 10 mL/min — ABNORMAL LOW (ref >60)
Glucose, Bld: 157 mg/dL — ABNORMAL HIGH (ref 70–99)
Potassium: 4.5 mmol/L (ref 3.5–5.1)
Sodium: 136 mmol/L (ref 135–145)
Total Bilirubin: 0.6 mg/dL (ref 0.3–1.2)
Total Protein: 7.6 g/dL (ref 6.5–8.1)

## 2020-07-07 LAB — CBC WITH DIFFERENTIAL/PLATELET
Abs Immature Granulocytes: 0.08 10*3/uL — ABNORMAL HIGH (ref 0.00–0.07)
Basophils Absolute: 0 10*3/uL (ref 0.0–0.1)
Basophils Relative: 0 %
Eosinophils Absolute: 0.1 10*3/uL (ref 0.0–0.5)
Eosinophils Relative: 1 %
HCT: 32 % — ABNORMAL LOW (ref 39.0–52.0)
Hemoglobin: 9.9 g/dL — ABNORMAL LOW (ref 13.0–17.0)
Immature Granulocytes: 1 %
Lymphocytes Relative: 13 %
Lymphs Abs: 1 10*3/uL (ref 0.7–4.0)
MCH: 36.7 pg — ABNORMAL HIGH (ref 26.0–34.0)
MCHC: 30.9 g/dL (ref 30.0–36.0)
MCV: 118.5 fL — ABNORMAL HIGH (ref 80.0–100.0)
Monocytes Absolute: 0.5 10*3/uL (ref 0.1–1.0)
Monocytes Relative: 6 %
Neutro Abs: 5.7 10*3/uL (ref 1.7–7.7)
Neutrophils Relative %: 79 %
Platelets: 128 10*3/uL — ABNORMAL LOW (ref 150–400)
RBC: 2.7 MIL/uL — ABNORMAL LOW (ref 4.22–5.81)
RDW: 18.3 % — ABNORMAL HIGH (ref 11.5–15.5)
WBC: 7.3 10*3/uL (ref 4.0–10.5)
nRBC: 1.2 % — ABNORMAL HIGH (ref 0.0–0.2)

## 2020-07-07 LAB — APTT: aPTT: 31 seconds (ref 24–36)

## 2020-07-07 LAB — PROTIME-INR
INR: 1 (ref 0.8–1.2)
Prothrombin Time: 12.8 seconds (ref 11.4–15.2)

## 2020-07-07 LAB — RESP PANEL BY RT-PCR (FLU A&B, COVID) ARPGX2
Influenza A by PCR: NEGATIVE
Influenza B by PCR: NEGATIVE
SARS Coronavirus 2 by RT PCR: NEGATIVE

## 2020-07-07 LAB — LACTIC ACID, PLASMA
Lactic Acid, Venous: 2.5 mmol/L (ref 0.5–1.9)
Lactic Acid, Venous: 1.6 mmol/L (ref 0.5–1.9)

## 2020-07-07 MED ORDER — AMOXICILLIN-POT CLAVULANATE 875-125 MG PO TABS: 1.0000 | ORAL_TABLET | Freq: Two times a day (BID) | ORAL | 0 refills | Status: DC

## 2020-07-07 MED ORDER — ACETAMINOPHEN 500 MG PO TABS
1000.0000 mg | ORAL_TABLET | Freq: Once | ORAL | Status: AC
  Administered 2020-07-07: 1000 mg via ORAL
  Filled 2020-07-07: qty 2

## 2020-07-07 MED ORDER — SODIUM CHLORIDE 0.9 % IV BOLUS
500.0000 mL | Freq: Once | INTRAVENOUS | Status: AC
  Administered 2020-07-07: 500 mL via INTRAVENOUS

## 2020-07-07 MED ORDER — AMOXICILLIN-POT CLAVULANATE 875-125 MG PO TABS
1.0000 | ORAL_TABLET | Freq: Once | ORAL | Status: AC
  Administered 2020-07-07: 1 via ORAL
  Filled 2020-07-07: qty 1

## 2020-07-07 NOTE — ED Provider Notes (Signed)
[name]MENT Provider Note   CSN: [id number] Arrival date & time: 07/07/20  6389     History Chief Complaint  Patient presents with  . Fever    Daniel [name] is a 60 y.o. male.  Daniel [name] is a 59 y.o. male with history of ESRD on HD, hypertension, diabetes, anemia, ICH, who presents to the emergency department via EMS from dialysis clinic for evaluation of fever.  Patient reports that he was feeling a bit unwell yesterday, but did not know that he had a fever, while at dialysis today he developed chills and started feeling more poorly. Found to be febrile to 102.4 at dialysis.  Completed 2 hours of his typical 4-hour dialysis treatment.  Patient also reports some shortness of breath, patient found to be at 92% on room air with EMS, they placed patient on 2 L nasal cannula, but satting normally on room air here in the ED.  Reports he has had an occasional cough.  Initially denies abdominal pain but then reports some left lower quadrant abdominal pain he thinks started yesterday, had 1 episode of diarrhea today, feels a bit nauseated but no vomiting.  Reports he still makes urine, but denies burning or pain with urination.  No flank pain.  Patient denies any known sick contacts.  Has had 1 dose of J&J vaccine for COVID.  He has not taken any medications prior to arrival.  No other aggravating or alleviating factors.        Past Medical History:  Diagnosis Date  . Anemia   . Diabetes mellitus without complication ([place])    patient denies  . Dialysis patient ([place])   . End stage chronic kidney disease ([place])   . Hypertension   . ICH (intracerebral hemorrhage) ([place]) 05/20/2017  . Shoulder pain, left 06/28/2013    Patient Active Problem List   Diagnosis Date Noted  . ESRD (end stage renal disease) ([place])   . AVM (arteriovenous malformation) of small bowel, acquired with hemorrhage   . Symptomatic anemia 07/31/2019  . Acute on chronic  anemia 07/23/2019  . Chronic kidney disease, stage V ([place])   . Chronic hepatitis C without hepatic coma ([place]) 07/11/2019  . Iron deficiency anemia 03/30/2019  . CKD (chronic kidney disease), stage V ([place]) 03/30/2019  . Alcohol use disorder 03/30/2019  . B12 deficiency 03/30/2019  . Orthostatic hypotension 01/22/2019  . Anemia   . Hypertension 06/29/2016  . DM2 (diabetes mellitus, type 2) ([place]) 06/29/2016    Past Surgical History:  Procedure Laterality Date  . APPENDECTOMY    . AV FISTULA PLACEMENT Left 08/03/2019   Procedure: LEFT ARM ARTERIOVENOUS (AV) CEPHALIC  FISTULA CREATION;  Surgeon: Waynetta Sandy, MD;  Location: [place];  Service: Vascular;  Laterality: Left;  . BIOPSY  06/30/2019   Procedure: BIOPSY;  Surgeon: Ronald Lobo, MD;  Location: [place];  Service: Endoscopy;;  . BIOPSY  08/02/2019   Procedure: BIOPSY;  Surgeon: Yetta Flock, MD;  Location: [place];  Service: Gastroenterology;;  . COLONOSCOPY  01/23/2012   Procedure: COLONOSCOPY;  Surgeon: Danie Binder, MD;  [facility];  Service: Endoscopy;  Laterality: N/A;  11:10 AM  . COLONOSCOPY WITH PROPOFOL N/A 06/30/2019   Procedure: COLONOSCOPY WITH PROPOFOL;  Surgeon: Ronald Lobo, MD;  Location: [place];  Service: Endoscopy;  Laterality: N/A;  . ENTEROSCOPY N/A 08/02/2019   Procedure: ENTEROSCOPY;  Surgeon: Yetta Flock, MD;  Location: [place];  Service:  Gastroenterology;  Laterality: N/A;  . ESOPHAGOGASTRODUODENOSCOPY (EGD) WITH PROPOFOL N/A 06/30/2019   Procedure: ESOPHAGOGASTRODUODENOSCOPY (EGD) WITH PROPOFOL;  Surgeon: Ronald Lobo, MD;  Location: [place];  Service: Endoscopy;  Laterality: N/A;  . FISTULA SUPERFICIALIZATION Left 10/17/2019   Procedure: LEFT UPPER EXTREMITY FISTULA REVISION, SIDE BRANCH LIGATION,  AND SUPERFICIALIZATION;  Surgeon: Marty Heck, MD;  Location: [place];  Service: Vascular;  Laterality: Left;  . GIVENS CAPSULE STUDY N/A  06/30/2019   Procedure: GIVENS CAPSULE STUDY;  Surgeon: Ronald Lobo, MD;  Location: [facility] END[facility];  Service: Endoscopy;  Laterality: N/A;  . HEMOSTASIS CONTROL  08/02/2019   Procedure: HEMOSTASIS CONTROL;  Surgeon: Yetta Flock, MD;  Location: [place] [facility];  Service: Gastroenterology;;  . INCISION AND DRAINAGE ABSCESS N/A 06/29/2016   Procedure: INCISION AND DRAINAGE ABDOMINAL WALL ABSCESS;  Surgeon: Alphonsa Overall, MD;  Location: [facility];  Service: General;  Laterality: N/A;  . INSERTION OF DIALYSIS CATHETER Right 08/03/2019   Procedure: INSERTION OF DIALYSIS CATHETER;  Surgeon: Waynetta Sandy, MD;  Location: [place];  Service: Vascular;  Laterality: Right;  . INSERTION OF DIALYSIS CATHETER Right 10/22/2019   Procedure: INSERTION OF 23CM TUNNELED DIALYSIS CATHETER RIGHT INTERNAL JUGULAR;  Surgeon: Angelia Mould, MD;  Location: [place];  Service: Vascular;  Laterality: Right;  . Left heel surgery    . PENILE BIOPSY N/A 03/26/2020   Procedure: PENILE ULCER DEBRIDEMENT;  Surgeon: Remi Haggard, MD;  Location: [facility];  Service: Urology;  Laterality: N/A;  30 MINS       Family History  Problem Relation Age of Onset  . Colon cancer Neg Hx     Social History   Tobacco Use  . Smoking status: Current Every Day Smoker    Packs/day: 1.00    Years: 30.00    Pack years: 30.00    Types: Cigarettes  . Smokeless tobacco: Never Used  Vaping Use  . Vaping Use: Never used  Substance Use Topics  . Alcohol use: Not Currently    Alcohol/week: 12.0 standard drinks    Types: 12 Cans of beer per week  . Drug use: Not Currently    Types: "Crack" cocaine    Comment: last in 2020    Home Medications Prior to Admission medications   Medication Sig Start Date End Date Taking? Authorizing Provider  amLODipine (NORVASC) 10 MG tablet Take 10 mg by mouth daily.    [provider]  blood glucose meter kit and supplies KIT Dispense based on patient and insurance preference. Use  up to four times daily as directed. (FOR ICD-9 250.00, 250.01). 07/01/16   Nita Sells, MD  Calcium Acetate 667 MG TABS Take 667-1,334 mg by mouth See admin instructions. Take 667 mg in the morning and afternoon and 1,334 in the evening 03/20/20   [provider]  cetirizine (ZYRTEC ALLERGY) 10 MG tablet Take 1 tablet (10 mg total) by mouth daily. 02/23/20   [name], NP  cloNIDine (CATAPRES) 0.2 MG tablet Take  1 tablet twice daily . If makes drowsy take 2 tablets - after he is off of work Patient taking differently: Take 0.2 mg by mouth See admin instructions. If makes drowsy take 2 tablets - after he is off of work 07/13/19   [name], NP  gabapentin (NEURONTIN) 300 MG capsule Take 1 capsule (300 mg total) by mouth 3 (three) times daily. Take 380m twice daily and 6067mat bedtime. Patient taking differently: Take 300-600 mg by mouth See  admin instructions. Take 322m twice daily and 601mat bedtime. 02/23/20   EdKerin PernaNP  HYDROcodone-acetaminophen (NORCO/VICODIN) 5-325 MG tablet Take 1 tablet by mouth every 4 (four) hours as needed for moderate pain. 05/29/20 05/29/21  SoFransico MeadowPA-C  HYDROmorphone (DILAUDID) 2 MG tablet Take 1 tablet (2 mg total) by mouth every 4 (four) hours as needed for severe pain. 05/20/20   FlDeno EtienneDO  hydrOXYzine (ATARAX/VISTARIL) 25 MG tablet Take 1 tablet (25 mg total) by mouth 2 (two) times daily as needed. Patient taking differently: Take 25 mg by mouth 2 (two) times daily as needed (Pain). 02/23/20   EdKerin PernaNP  lidocaine-prilocaine (EMLA) cream Apply 1 application topically daily as needed. 01/24/20   [provider]  mupirocin cream (BACTROBAN) 2 % Apply 1 application topically 2 (two) times daily. 04/30/20   HaIsla PenceMD  mupirocin ointment (BACTROBAN) 2 % Apply 1 application topically 2 (two) times daily. 02/23/20   HaVanessa KickMD  oxyCODONE-acetaminophen (PERCOCET) 5-325 MG  tablet Take 1 tablet by mouth every 4 (four) hours as needed for severe pain. 03/26/20 03/26/21  NeRemi HaggardMD  oxyCODONE-acetaminophen (PERCOCET/ROXICET) 5-325 MG tablet Take 1 tablet by mouth every 4 (four) hours as needed. 03/21/20   [provider]  Pramoxine HCl 0.5 % OINT Apply 1 application topically 4 (four) times daily. 04/30/20   HaIsla PenceMD  sulfamethoxazole-trimethoprim (BACTRIM) 400-80 MG tablet Take 1 tablet by mouth 2 (two) times daily. 04/30/20   HaIsla PenceMD  terbinafine (LAMISIL) 1 % cream Apply 1 application topically 2 (two) times daily. 06/04/20   LaVolney AmericanPA-C  triamcinolone (KENALOG) 0.1 % Apply 1 application topically 2 (two) times daily.    [provider]  ferrous sulfate 325 (65 FE) MG tablet SMARTSIG:1 Tablet(s) By Mouth Twice Daily 08/11/19 02/09/20  [provider]  furosemide (LASIX) 40 MG tablet Take 1 tablet (40 mg total) by mouth daily. 07/25/19 02/09/20  JoLadona HornsMD    Allergies    Patient has no known allergies.  Review of Systems   Review of Systems  Constitutional: Positive for chills, fatigue and fever.  HENT: Negative for congestion, rhinorrhea and sore throat.   Respiratory: Positive for cough and shortness of breath.   Cardiovascular: Negative for chest pain, palpitations and leg swelling.  Gastrointestinal: Positive for abdominal pain, diarrhea and nausea. Negative for constipation and vomiting.  Genitourinary: Negative for dysuria, flank pain and frequency.  Musculoskeletal: Negative for arthralgias and myalgias.  Skin: Negative for color change and rash.  Neurological: Negative for dizziness, syncope and light-headedness.  All other systems reviewed and are negative.   Physical Exam Updated Vital Signs Pulse (!) 114   Temp (!) 100.6 F (38.1 C) (Oral)   Resp (!) 22   SpO2 100%   Physical Exam Vitals and nursing note reviewed.  Constitutional:      General: He is not in  acute distress.    Appearance: Normal appearance. He is well-developed. He is not diaphoretic.     Comments: Alert, chronically ill-appearing, but in no acute distress  HENT:     Head: Normocephalic and atraumatic.     Mouth/Throat:     Mouth: Mucous membranes are moist.     Pharynx: Oropharynx is clear.  Eyes:     General:        Right eye: No discharge.        Left eye: No discharge.  Pupils: Pupils are equal, round, and reactive to light.  Cardiovascular:     Rate and Rhythm: Regular rhythm. Tachycardia present.     Heart sounds: Normal heart sounds. No murmur heard. No friction rub. No gallop.      Comments: Mildly tachycardic on arrival with regular rhythm Pulmonary:     Effort: Pulmonary effort is normal. No respiratory distress.     Breath sounds: Normal breath sounds. No wheezing or rales.     Comments: Respirations equal and unlabored, patient able to speak in full sentences, lungs clear to auscultation bilaterally breath sounds slightly diminished bilaterally, satting well on room air, occasional dry cough Abdominal:     General: Bowel sounds are normal. There is no distension.     Palpations: Abdomen is soft. There is no mass.     Tenderness: There is abdominal tenderness. There is no guarding.     Comments: Abdomen is soft and nondistended, bowel sounds present throughout, there is mild left lower quadrant tenderness without guarding or rebound tenderness, all other quadrants nontender to palpation, no CVA tenderness bilaterally, no peritoneal signs.  Musculoskeletal:        General: No deformity.     Cervical back: Neck supple.     Comments: Mild edema. Dialysis fistula noted in the left upper arm  Skin:    General: Skin is warm and dry.     Capillary Refill: Capillary refill takes less than 2 seconds.  Neurological:     Mental Status: He is alert.     Coordination: Coordination normal.     Comments: Speech is clear, able to follow commands CN III-XII  intact Normal strength in upper and lower extremities bilaterally including dorsiflexion and plantar flexion, strong and equal grip strength Sensation normal to light and sharp touch Moves extremities without ataxia, coordination intact  Psychiatric:        Mood and Affect: Mood normal.        Behavior: Behavior normal.     ED Results / Procedures / Treatments   Labs (all labs ordered are listed, but only abnormal results are displayed) Labs Reviewed  LACTIC ACID, PLASMA - Abnormal; Notable for the following components:      Result Value   Lactic Acid, Venous 2.5 (*)    All other components within normal limits  COMPREHENSIVE METABOLIC PANEL - Abnormal; Notable for the following components:   Glucose, Bld 157 (*)    BUN 30 (*)    Creatinine, Ser 6.06 (*)    Calcium 8.7 (*)    Albumin 2.7 (*)    AST 93 (*)    GFR, Estimated 10 (*)    All other components within normal limits  CBC WITH DIFFERENTIAL/PLATELET - Abnormal; Notable for the following components:   RBC 2.70 (*)    Hemoglobin 9.9 (*)    HCT 32.0 (*)    MCV 118.5 (*)    MCH 36.7 (*)    RDW 18.3 (*)    Platelets 128 (*)    nRBC 1.2 (*)    Abs Immature Granulocytes 0.08 (*)    All other components within normal limits  CULTURE, BLOOD (SINGLE)  RESP PANEL BY RT-PCR (FLU A&B, COVID) [id number]  LACTIC ACID, PLASMA  PROTIME-INR  APTT    EKG None  Radiology CT ABDOMEN PELVIS WO CONTRAST  Result Date: 07/07/2020 CLINICAL DATA:  Diverticulitis suspected.  Pain. EXAM: CT ABDOMEN AND PELVIS WITHOUT CONTRAST TECHNIQUE: Multidetector CT imaging of the abdomen and pelvis was performed following  the standard protocol without IV contrast. COMPARISON:  July 17, 2019 FINDINGS: Lower chest: No acute abnormality. Hepatobiliary: Cholelithiasis without obvious wall thickening or pericholecystic fluid. Liver is unremarkable. Pancreas: Unremarkable. No pancreatic ductal dilatation or surrounding inflammatory changes. Spleen: Normal in  size without focal abnormality. Adrenals/Urinary Tract: There is a 3 mm stone in the lower pole the left kidney. No other stones. No hydronephrosis or perinephric stranding. The ureters are normal with no stones. The bladder is unremarkable. Adrenal glands are normal. Stomach/Bowel: The stomach and small bowel are normal. The colon is unremarkable with no evidence of diverticulitis. One or 2 scattered colonic diverticuli are identified. The appendix is not visualized but there is no secondary evidence of appendicitis. Vascular/Lymphatic: Calcified atherosclerosis is seen in the abdominal aorta without aneurysm. No adenopathy. Reproductive: Prostate is unremarkable. Other: There is a fat containing umbilical hernia. No free air free fluid. Musculoskeletal: No acute or significant osseous findings. IMPRESSION: 1. Cholelithiasis without wall thickening or pericholecystic fluid. 2. No diverticulitis identified. A few scattered colonic diverticuli are noted. 3. 3 mm nonobstructive stone in the lower left kidney. 4. Small fat containing umbilical hernia. 5. No other abnormalities. Electronically Signed   By: Dorise Bullion III M.D   On: 07/07/2020 12:42   DG Chest Port 1 View  Result Date: 07/07/2020 CLINICAL DATA:  Questionable sepsis. EXAM: PORTABLE CHEST 1 VIEW COMPARISON:  December 17, 2019 FINDINGS: Stable cardiomegaly. The cardiomediastinal silhouette is stable. No pneumothorax. No nodules or masses. No focal infiltrates. IMPRESSION: No active disease. Electronically Signed   By: Dorise Bullion III M.D   On: 07/07/2020 10:43    Procedures Procedures   Medications Ordered in ED Medications  acetaminophen (TYLENOL) tablet 1,000 mg (1,000 mg Oral Given 07/07/20 1210)  sodium chloride 0.9 % bolus 500 mL (0 mLs Intravenous Stopped 07/07/20 1523)  sodium chloride 0.9 % bolus 500 mL (0 mLs Intravenous Stopped 07/07/20 1523)  amoxicillin-clavulanate (AUGMENTIN) 875-125 MG per tablet 1 tablet (1 tablet Oral Given  07/07/20 1524)    ED Course  I have reviewed the triage vital signs and the nursing notes.  Pertinent labs & imaging results that were available during my care of the patient were reviewed by me and considered in my medical decision making (see chart for details).    MDM Rules/Calculators/A&P                          60 year old male arrives from dialysis clinic for evaluation of fever, started feeling poorly yesterday but did not know he had a fever until he started having chills about 2 hours into his dialysis session.  States he has had an intermittent cough and some shortness of breath and has also noted some mild lower abdominal pain and one episode of diarrhea.  Denies chest pain.  Does still make urine but denies urinary symptoms.  On arrival patient febrile to 100.6 and mildly tachycardic, hypertensive, satting well on room air.  Despite fever patient is overall well-appearing and hemodynamically stable.  Will treat fever and will initiate work-up for evolving sepsis, but will hold off on starting IV antibiotics or aggressive fluid hydration.  Also some concern for possible viral upper respiratory infection, such as Covid, given fever and intermittent cough, patient has been vaccinated with 1 dose of J&J.  I have independently ordered, reviewed and interpreted all labs and imaging: CBC: No leukocytosis, hemoglobin of 9.9 at baseline, platelets 128 CMP: Glucose of 157 but no  other significant electrolyte derangements, despite not completing dialysis treatment today potassium of 4.5, creatinine of 6.06 and BUN of 30, mildly elevated AST, but LFTs otherwise normal.  No anion gap. Lactic acid: Initially elevated at 2.5, after given 1 L of IV fluids patient's lactic has normalized to 1.6, he has had no episodes of hypotension.  No concern for severe sepsis.  Patient's fever and tachycardia both resolved with Tylenol. Coags: WNL  Covid & flu panel: Negative  Patient unable to provide urine  sample, on bladder scan patient with very small amount of urine in bladder so will not cath.  Suspect patient does not make very much urine anymore.  Low suspicion for UTI.  Chest x-ray is clear with no evidence of pneumonia or other active cardiopulmonary disease.  No clear source for fever at this time, on repeat abdominal exam patient has increasing tenderness in the left lower quadrant.  Will get CT without contrast to evaluate for potential diverticulitis, colitis or other intra-abdominal source for infection.  CT shows cholelithiasis without wall thickening or pericholecystic fluid, patient does not have any right upper quadrant tenderness, negative Murphy sign on exam.  CT does not show any clear evidence of diverticulitis but does show some colonic diverticuli, patient continues to be focally tender in the left lower quadrant.  He has a 3 mm nonobstructive stone in the lower left kidney and a fat-containing umbilical hernia, no other acute abnormalities noted.  No other source for patient's fever, he does have a blood culture pending.  Concerned this could be a viral upper respiratory infection, but also concern for developing diverticulitis given left lower quadrant pain and an episode of diarrhea with fever.  Case discussed with Dr. Almyra Free who has seen and evaluated patient as well.  Recommends starting patient on Augmentin for presumed diverticulitis, but given the patient is alert, vitals are now normal and he is overall well-appearing feel he could be treated as an outpatient with strict return precautions.  I discussed this plan with the patient who expresses understanding and agreement.  Given first dose of Augmentin here in the ED, tolerating p.o.  Stressed the importance of close follow-up for any worsening symptoms and patient expresses understanding and agreement.  Will call his dialysis center to schedule next session.  At this time feel patient is stable for discharge home.  Final  Clinical Impression(s) / ED Diagnoses Final diagnoses:  Fever, unspecified fever cause  LLQ abdominal pain  Diarrhea, unspecified type    Rx / DC Orders ED Discharge Orders         Ordered    amoxicillin-clavulanate (AUGMENTIN) 875-125 MG tablet  2 times daily,   Status:  Discontinued        07/07/20 1512    amoxicillin-clavulanate (AUGMENTIN) 875-125 MG tablet  2 times daily        07/07/20 1513           Janet Berlin 07/11/20 1257    Luna Fuse, MD 07/18/20 1918

## 2020-07-07 NOTE — ED Notes (Signed)
Pt removed his dialysis clamp after this nurse explaining the need to leave it on.

## 2020-07-07 NOTE — Discharge Instructions (Addendum)
Your work-up today has overall been reassuring, we do see some signs of diverticuli on your CT and given your left lower quadrant pain and fever I am concerned for developing diverticulitis, this could be the cause of your fever.  You could also have a viral respiratory infection, your Covid and flu test were negative today and chest x-ray shows no evidence of pneumonia.  If you develop worsening fevers, worsening abdominal pain, vomiting, blood in your stool, pain with urination, cough, worsening shortness of breath or chest pain please return to the emergency department.  You also have a blood culture pending and if this comes back positive you will be called and asked to return for further evaluation.  Please follow closely with your primary care doctor.

## 2020-07-07 NOTE — ED Triage Notes (Signed)
Pt BB [facility] from dialysis. Sudden onset fever and chills after 2hours and 2 liters of a 4 hour treatment. C/O chronic leg pain and SOB.

## 2020-07-07 NOTE — ED Notes (Signed)
Pt up to bathroom independently. Reminded to use call bell for assistance. Transported to CT via stretcher.

## 2020-07-07 NOTE — ED Notes (Addendum)
Primary RN and PA notified of critical result-Lactic 2.5

## 2020-07-07 NOTE — ED Notes (Signed)
Bladder scanner with 61ml urine

## 2020-07-12 LAB — CULTURE, BLOOD (SINGLE)
Culture: NO GROWTH
Special Requests: ADEQUATE

## 2020-07-18 ENCOUNTER — Ambulatory Visit (INDEPENDENT_AMBULATORY_CARE_PROVIDER_SITE_OTHER): Payer: Medicare Other | Admitting: Primary Care

## 2020-07-24 ENCOUNTER — Ambulatory Visit (HOSPITAL_COMMUNITY)
Admission: EM | Admit: 2020-07-24 | Discharge: 2020-07-24 | Disposition: A | Discharge status: Home / Self Care | Payer: Medicare Other

## 2020-07-24 ENCOUNTER — Encounter (HOSPITAL_COMMUNITY): Payer: Self-pay

## 2020-07-24 ENCOUNTER — Emergency Department (HOSPITAL_COMMUNITY)
Admission: EM | Admit: 2020-07-24 | Discharge: 2020-07-24 | Disposition: A | Discharge status: Home / Self Care | Payer: Medicare Other | Attending: Emergency Medicine | Admitting: Emergency Medicine

## 2020-07-24 ENCOUNTER — Other Ambulatory Visit: Payer: Self-pay

## 2020-07-24 DIAGNOSIS — M25532 Pain in left wrist: Secondary | ICD-10-CM | POA: Insufficient documentation

## 2020-07-24 DIAGNOSIS — E1122 Type 2 diabetes mellitus with diabetic chronic kidney disease: Secondary | ICD-10-CM | POA: Diagnosis not present

## 2020-07-24 DIAGNOSIS — I120 Hypertensive chronic kidney disease with stage 5 chronic kidney disease or end stage renal disease: Secondary | ICD-10-CM | POA: Diagnosis not present

## 2020-07-24 DIAGNOSIS — Z79899 Other long term (current) drug therapy: Secondary | ICD-10-CM | POA: Diagnosis not present

## 2020-07-24 DIAGNOSIS — Z992 Dependence on renal dialysis: Secondary | ICD-10-CM | POA: Insufficient documentation

## 2020-07-24 DIAGNOSIS — N186 End stage renal disease: Secondary | ICD-10-CM | POA: Insufficient documentation

## 2020-07-24 DIAGNOSIS — F17210 Nicotine dependence, cigarettes, uncomplicated: Secondary | ICD-10-CM | POA: Insufficient documentation

## 2020-07-24 LAB — CBC WITH DIFFERENTIAL/PLATELET
Abs Immature Granulocytes: 0.06 10*3/uL (ref 0.00–0.07)
Basophils Absolute: 0 10*3/uL (ref 0.0–0.1)
Basophils Relative: 0 %
Eosinophils Absolute: 0.1 10*3/uL (ref 0.0–0.5)
Eosinophils Relative: 1 %
HCT: 33.6 % — ABNORMAL LOW (ref 39.0–52.0)
Hemoglobin: 10.7 g/dL — ABNORMAL LOW (ref 13.0–17.0)
Immature Granulocytes: 1 %
Lymphocytes Relative: 30 %
Lymphs Abs: 1.9 10*3/uL (ref 0.7–4.0)
MCH: 35 pg — ABNORMAL HIGH (ref 26.0–34.0)
MCHC: 31.8 g/dL (ref 30.0–36.0)
MCV: 109.8 fL — ABNORMAL HIGH (ref 80.0–100.0)
Monocytes Absolute: 0.7 10*3/uL (ref 0.1–1.0)
Monocytes Relative: 11 %
Neutro Abs: 3.6 10*3/uL (ref 1.7–7.7)
Neutrophils Relative %: 57 %
Platelets: 156 10*3/uL (ref 150–400)
RBC: 3.06 MIL/uL — ABNORMAL LOW (ref 4.22–5.81)
RDW: 16.3 % — ABNORMAL HIGH (ref 11.5–15.5)
WBC: 6.3 10*3/uL (ref 4.0–10.5)
nRBC: 0.5 % — ABNORMAL HIGH (ref 0.0–0.2)

## 2020-07-24 LAB — COMPREHENSIVE METABOLIC PANEL
ALT: 28 U/L (ref 0–44)
AST: 74 U/L — ABNORMAL HIGH (ref 15–41)
Albumin: 2.8 g/dL — ABNORMAL LOW (ref 3.5–5.0)
Alkaline Phosphatase: 64 U/L (ref 38–126)
Anion gap: 13 (ref 5–15)
BUN: 24 mg/dL — ABNORMAL HIGH (ref 6–20)
CO2: 29 mmol/L (ref 22–32)
Calcium: 8.6 mg/dL — ABNORMAL LOW (ref 8.9–10.3)
Chloride: 94 mmol/L — ABNORMAL LOW (ref 98–111)
Creatinine, Ser: 5.99 mg/dL — ABNORMAL HIGH (ref 0.61–1.24)
GFR, Estimated: 10 mL/min — ABNORMAL LOW (ref >60)
Glucose, Bld: 163 mg/dL — ABNORMAL HIGH (ref 70–99)
Potassium: 3.2 mmol/L — ABNORMAL LOW (ref 3.5–5.1)
Sodium: 136 mmol/L (ref 135–145)
Total Bilirubin: 0.4 mg/dL (ref 0.3–1.2)
Total Protein: 8.7 g/dL — ABNORMAL HIGH (ref 6.5–8.1)

## 2020-07-24 MED ORDER — OXYCODONE-ACETAMINOPHEN 5-325 MG PO TABS
2.0000 | ORAL_TABLET | Freq: Once | ORAL | Status: AC
  Administered 2020-07-24: 2 via ORAL
  Filled 2020-07-24: qty 2

## 2020-07-24 MED ORDER — COLCHICINE 0.6 MG PO TABS: 0.3000 mg | ORAL_TABLET | Freq: Two times a day (BID) | ORAL | 0 refills | Status: DC

## 2020-07-24 MED ORDER — CEPHALEXIN 500 MG PO CAPS: 500.0000 mg | ORAL_CAPSULE | Freq: Two times a day (BID) | ORAL | 0 refills | Status: DC

## 2020-07-24 MED ORDER — COLCHICINE 0.6 MG PO TABS
0.6000 mg | ORAL_TABLET | ORAL | Status: AC
  Administered 2020-07-24: 0.6 mg via ORAL
  Filled 2020-07-24: qty 1

## 2020-07-24 NOTE — Discharge Instructions (Signed)
Your symptoms are likely related to gout in your wrist however if you are developing severe or worsening pain swelling fever or spreading redness you must return to the emergency department immediately.  Please follow-up with your doctor within 3 days if no improvement  Take the following medications:  Colchicine, 0.3 mg twice daily  Cephalexin, 500 mg twice daily, on dialysis days make sure you take your dose after dialysis.

## 2020-07-24 NOTE — ED Provider Notes (Signed)
[facility] Provider Note   CSN: [id number] Arrival date & time: 07/24/20  1200     History No chief complaint on file.   Daniel [name] is a 60 y.o. male.  HPI   This patient is a 60 year old male, he states that he is on dialysis, he dialyzed today and received a full session.  He also has a history of alcohol use disorder and hepatitis C.  He has never had any problems with significant inflammatory arthritis other than minor aches and pains however he presents today with 2 days of left wrist pain.  He states that it happened 2 days ago he awoke from sleep with a rather severe pain in his left wrist it is painful to light touch, even the bed sheets bother it, it is painful with any range of motion and is associated with a slight swelling and a slight redness.  He denies any fever, denies any stretch or swelling or spread of this up the arm or to the hand however when he tries to move his wrist it causes recurrent pain.  He has never had any gout, he has no complaints of any other joints of his body and denies any problems with his graft or fistula.  Past Medical History:  Diagnosis Date  . Anemia   . Diabetes mellitus without complication ([place])    patient denies  . Dialysis patient ([place])   . End stage chronic kidney disease ([place])   . Hypertension   . ICH (intracerebral hemorrhage) ([place]) 05/20/2017  . Shoulder pain, left 06/28/2013    Patient Active Problem List   Diagnosis Date Noted  . ESRD (end stage renal disease) ([place])   . AVM (arteriovenous malformation) of small bowel, acquired with hemorrhage   . Symptomatic anemia 07/31/2019  . Acute on chronic anemia 07/23/2019  . Chronic kidney disease, stage V ([place])   . Chronic hepatitis C without hepatic coma ([place]) 07/11/2019  . Iron deficiency anemia 03/30/2019  . CKD (chronic kidney disease), stage V ([place]) 03/30/2019  . Alcohol use disorder 03/30/2019  . B12 deficiency 03/30/2019  .  Orthostatic hypotension 01/22/2019  . Anemia   . Hypertension 06/29/2016  . DM2 (diabetes mellitus, type 2) ([place]) 06/29/2016    Past Surgical History:  Procedure Laterality Date  . APPENDECTOMY    . AV FISTULA PLACEMENT Left 08/03/2019   Procedure: LEFT ARM ARTERIOVENOUS (AV) CEPHALIC  FISTULA CREATION;  Surgeon: Waynetta Sandy, MD;  Location: [facility];  Service: Vascular;  Laterality: Left;  . BIOPSY  06/30/2019   Procedure: BIOPSY;  Surgeon: Ronald Lobo, MD;  Location: [facility];  Service: Endoscopy;;  . BIOPSY  08/02/2019   Procedure: BIOPSY;  Surgeon: Yetta Flock, MD;  Location: [facility];  Service: Gastroenterology;;  . COLONOSCOPY  01/23/2012   Procedure: COLONOSCOPY;  Surgeon: Danie Binder, MD;  [facility];  Service: Endoscopy;  Laterality: N/A;  11:10 AM  . COLONOSCOPY WITH PROPOFOL N/A 06/30/2019   Procedure: COLONOSCOPY WITH PROPOFOL;  Surgeon: Ronald Lobo, MD;  Location: [place];  Service: Endoscopy;  Laterality: N/A;  . ENTEROSCOPY N/A 08/02/2019   Procedure: ENTEROSCOPY;  Surgeon: Yetta Flock, MD;  Location: [facility] END[facility]COPY;  Service: Gastroenterology;  Laterality: N/A;  . ESOPHAGOGASTRODUODENOSCOPY (EGD) WITH PROPOFOL N/A 06/30/2019   Procedure: ESOPHAGOGASTRODUODENOSCOPY (EGD) WITH PROPOFOL;  Surgeon: Ronald Lobo, MD;  Location: [facility];  Service: Endoscopy;  Laterality: N/A;  . FISTULA SUPERFICIALIZATION Left 10/17/2019   Procedure:  LEFT UPPER EXTREMITY FISTULA REVISION, SIDE BRANCH LIGATION,  AND SUPERFICIALIZATION;  Surgeon: Marty Heck, MD;  Location: [facility];  Service: Vascular;  Laterality: Left;  . GIVENS CAPSULE STUDY N/A 06/30/2019   Procedure: GIVENS CAPSULE STUDY;  Surgeon: Ronald Lobo, MD;  Location: [facility] ENDOSCOPY;  Service: Endoscopy;  Laterality: N/A;  . HEMOSTASIS CONTROL  08/02/2019   Procedure: HEMOSTASIS CONTROL;  Surgeon: Yetta Flock, MD;  Location: [place] ENDOS[facility];  Service:  Gastroenterology;;  . INCISION AND DRAINAGE ABSCESS N/A 06/29/2016   Procedure: INCISION AND DRAINAGE ABDOMINAL WALL ABSCESS;  Surgeon: Alphonsa Overall, MD;  Location: [facility];  Service: General;  Laterality: N/A;  . INSERTION OF DIALYSIS CATHETER Right 08/03/2019   Procedure: INSERTION OF DIALYSIS CATHETER;  Surgeon: Waynetta Sandy, MD;  Location: [place];  Service: Vascular;  Laterality: Right;  . INSERTION OF DIALYSIS CATHETER Right 10/22/2019   Procedure: INSERTION OF 23CM TUNNELED DIALYSIS CATHETER RIGHT INTERNAL JUGULAR;  Surgeon: Angelia Mould, MD;  Location: [place];  Service: Vascular;  Laterality: Right;  . Left heel surgery    . PENILE BIOPSY N/A 03/26/2020   Procedure: PENILE ULCER DEBRIDEMENT;  Surgeon: Remi Haggard, MD;  Location: [facility];  Service: Urology;  Laterality: N/A;  30 MINS       Family History  Problem Relation Age of Onset  . Colon cancer Neg Hx     Social History   Tobacco Use  . Smoking status: Current Every Day Smoker    Packs/day: 1.00    Years: 30.00    Pack years: 30.00    Types: Cigarettes  . Smokeless tobacco: Never Used  Vaping Use  . Vaping Use: Never used  Substance Use Topics  . Alcohol use: Not Currently    Alcohol/week: 12.0 standard drinks    Types: 12 Cans of beer per week  . Drug use: Not Currently    Types: "Crack" cocaine    Comment: last in 2020    Home Medications Prior to Admission medications   Medication Sig Start Date End Date Taking? Authorizing Provider  cephALEXin (KEFLEX) 500 MG capsule Take 1 capsule (500 mg total) by mouth 2 (two) times daily for 7 days. 07/24/20 07/31/20 Yes Noemi Chapel, MD  colchicine 0.6 MG tablet Take 0.5 tablets (0.3 mg total) by mouth 2 (two) times daily. 07/24/20  Yes Noemi Chapel, MD  amLODipine (NORVASC) 10 MG tablet Take 10 mg by mouth daily.    [provider]  amoxicillin-clavulanate (AUGMENTIN) 875-125 MG tablet Take 1 tablet by mouth 2 (two) times daily. One po bid  x 7 days 07/07/20   Jacqlyn Larsen, PA-C  blood glucose meter kit and supplies KIT Dispense based on patient and insurance preference. Use up to four times daily as directed. (FOR ICD-9 250.00, 250.01). 07/01/16   Nita Sells, MD  Calcium Acetate 667 MG TABS Take 667-1,334 mg by mouth See admin instructions. Take 667 mg in the morning and afternoon and 1,334 in the evening 03/20/20   [provider]  cetirizine (ZYRTEC ALLERGY) 10 MG tablet Take 1 tablet (10 mg total) by mouth daily. 02/23/20   [name], NP  cloNIDine (CATAPRES) 0.2 MG tablet Take  1 tablet twice daily . If makes drowsy take 2 tablets - after he is off of work Patient taking differently: Take 0.2 mg by mouth See admin instructions. If makes drowsy take 2 tablets - after he is off of work 07/13/19   [name], NP  gabapentin (NEURONTIN) 300 MG capsule Take 1 capsule (300 mg total) by mouth 3 (three) times daily. Take 336m twice daily and 6046mat bedtime. Patient taking differently: Take 300-600 mg by mouth See admin instructions. Take 30039mwice daily and 600m48m bedtime. 02/23/20   EdwaKerin Perna  HYDROcodone-acetaminophen (NORCO/VICODIN) 5-325 MG tablet Take 1 tablet by mouth every 4 (four) hours as needed for moderate pain. 05/29/20 05/29/21  SofiFransico Meadow-C  HYDROmorphone (DILAUDID) 2 MG tablet Take 1 tablet (2 mg total) by mouth every 4 (four) hours as needed for severe pain. 05/20/20   FloyDeno Etienne  hydrOXYzine (ATARAX/VISTARIL) 25 MG tablet Take 1 tablet (25 mg total) by mouth 2 (two) times daily as needed. Patient taking differently: Take 25 mg by mouth 2 (two) times daily as needed (Pain). 02/23/20   EdwaKerin Perna  lidocaine-prilocaine (EMLA) cream Apply 1 application topically daily as needed. 01/24/20   [provider]  mupirocin cream (BACTROBAN) 2 % Apply 1 application topically 2 (two) times daily. 04/30/20   HaviIsla Pence  mupirocin ointment  (BACTROBAN) 2 % Apply 1 application topically 2 (two) times daily. 02/23/20   HaglVanessa Kick  oxyCODONE-acetaminophen (PERCOCET) 5-325 MG tablet Take 1 tablet by mouth every 4 (four) hours as needed for severe pain. 03/26/20 03/26/21  NewsRemi Haggard  oxyCODONE-acetaminophen (PERCOCET/ROXICET) 5-325 MG tablet Take 1 tablet by mouth every 4 (four) hours as needed. 03/21/20   [provider]  Pramoxine HCl 0.5 % OINT Apply 1 application topically 4 (four) times daily. 04/30/20   HaviIsla Pence  sulfamethoxazole-trimethoprim (BACTRIM) 400-80 MG tablet Take 1 tablet by mouth 2 (two) times daily. 04/30/20   HaviIsla Pence  terbinafine (LAMISIL) 1 % cream Apply 1 application topically 2 (two) times daily. 06/04/20   LaneVolney American-C  triamcinolone (KENALOG) 0.1 % Apply 1 application topically 2 (two) times daily.    [provider]  ferrous sulfate 325 (65 FE) MG tablet SMARTSIG:1 Tablet(s) By Mouth Twice Daily 08/11/19 02/09/20  [provider]  furosemide (LASIX) 40 MG tablet Take 1 tablet (40 mg total) by mouth daily. 07/25/19 02/09/20  JoneLadona Horns    Allergies    Patient has no known allergies.  Review of Systems   Review of Systems  All other systems reviewed and are negative.   Physical Exam Updated Vital Signs BP (!) 161/105   Pulse 88   Temp 98.6 F (37 C) (Oral)   Resp 16   SpO2 98%   Physical Exam Vitals and nursing note reviewed.  Constitutional:      General: He is not in acute distress.    Appearance: He is well-developed.  HENT:     Head: Normocephalic and atraumatic.     Mouth/Throat:     Pharynx: No oropharyngeal exudate.  Eyes:     General: No scleral icterus.       Right eye: No discharge.        Left eye: No discharge.     Conjunctiva/sclera: Conjunctivae normal.     Pupils: Pupils are equal, round, and reactive to light.  Neck:     Thyroid: No thyromegaly.     Vascular: No JVD.  Cardiovascular:      Rate and Rhythm: Normal rate and regular rhythm.     Heart sounds: Normal heart sounds. No murmur heard. No friction rub. No gallop.      Comments: Good thrill in the  left upper extremity fistula Pulmonary:     Effort: Pulmonary effort is normal. No respiratory distress.     Breath sounds: Normal breath sounds. No wheezing or rales.  Abdominal:     General: Bowel sounds are normal. There is no distension.     Palpations: Abdomen is soft. There is no mass.     Tenderness: There is no abdominal tenderness.  Musculoskeletal:        General: Swelling and tenderness present.     Cervical back: Normal range of motion and neck supple.     Comments: There is tenderness with range of motion of the left wrist, there is a slight warmth to the radial aspect of the wrist, there is tenderness with any palpation over any part of the wrist.  All other joints have normal range of motion  Lymphadenopathy:     Cervical: No cervical adenopathy.  Skin:    General: Skin is warm and dry.     Findings: No erythema or rash.  Neurological:     Mental Status: He is alert.     Coordination: Coordination normal.  Psychiatric:        Behavior: Behavior normal.     ED Results / Procedures / Treatments   Labs (all labs ordered are listed, but only abnormal results are displayed) Labs Reviewed  COMPREHENSIVE METABOLIC PANEL - Abnormal; Notable for the following components:      Result Value   Potassium 3.2 (*)    Chloride 94 (*)    Glucose, Bld 163 (*)    BUN 24 (*)    Creatinine, Ser 5.99 (*)    Calcium 8.6 (*)    Total Protein 8.7 (*)    Albumin 2.8 (*)    AST 74 (*)    GFR, Estimated 10 (*)    All other components within normal limits  CBC WITH DIFFERENTIAL/PLATELET - Abnormal; Notable for the following components:   RBC 3.06 (*)    Hemoglobin 10.7 (*)    HCT 33.6 (*)    MCV 109.8 (*)    MCH 35.0 (*)    RDW 16.3 (*)    nRBC 0.5 (*)    All other components within normal limits     EKG None  Radiology No results found.  Procedures Procedures   Medications Ordered in ED Medications  oxyCODONE-acetaminophen (PERCOCET/ROXICET) 5-325 MG per tablet 2 tablet (has no administration in time range)  colchicine tablet 0.6 mg (has no administration in time range)    ED Course  I have reviewed the triage vital signs and the nursing notes.  Pertinent labs & imaging results that were available during my care of the patient were reviewed by me and considered in my medical decision making (see chart for details).    MDM Rules/Calculators/A&P                          This patient has no leukocytosis, no fever and and exam consistent of a monoarticular arthritis that was acute in onset, could be consistent with an inflammatory gouty arthritis.  We will consult with pharmacy regarding dosing of colchicine, will also give cephalexin in case there is a early infection though the presentation is not entirely suggestive he is at higher risk for infection.  It does not appear severely swollen thus I do not think an aspiration of the joint would be necessary for diagnosis at this time.  He was given the indications for return  and is agreeable.  I discussed the patient's care with the ED pharmacist who recommends the dosing of colchicine to be 0.6 mg now, 0.3 mg twice daily until pain gone.  Cephalexin can be 500 mg twice daily.  The patient is totally agreeable to the plan, he is well-appearing without fever tachycardia, labs reviewed and unremarkable.  Stable for discharge  Final Clinical Impression(s) / ED Diagnoses Final diagnoses:  Wrist pain, acute, left    Rx / DC Orders ED Discharge Orders         Ordered    cephALEXin (KEFLEX) 500 MG capsule  2 times daily        07/24/20 1534    colchicine 0.6 MG tablet  2 times daily        07/24/20 1534           Noemi Chapel, MD 07/24/20 1535

## 2020-07-24 NOTE — ED Notes (Signed)
Paged [facility] per Dr. Sabra Heck instruction

## 2020-07-24 NOTE — ED Triage Notes (Signed)
Patient complains of left forearm pain for several days. Denies injury and this arm has dialysis graft. Had normal dialysis treatment today and no change in pain when graft accessed. Tender to touch, no redness.

## 2020-07-24 NOTE — ED Notes (Signed)
Discharge instructions discussed with pt. Pt verbalized understanding with no questions. Wrist brace applied to left wrist.

## 2020-07-29 ENCOUNTER — Emergency Department (HOSPITAL_COMMUNITY)
Admission: EM | Admit: 2020-07-29 | Discharge: 2020-07-29 | Disposition: A | Discharge status: Home / Self Care | Payer: Medicare Other | Attending: Emergency Medicine | Admitting: Emergency Medicine

## 2020-07-29 ENCOUNTER — Emergency Department (HOSPITAL_COMMUNITY): Payer: Medicare Other

## 2020-07-29 ENCOUNTER — Other Ambulatory Visit: Payer: Self-pay

## 2020-07-29 DIAGNOSIS — Z992 Dependence on renal dialysis: Secondary | ICD-10-CM | POA: Insufficient documentation

## 2020-07-29 DIAGNOSIS — E1122 Type 2 diabetes mellitus with diabetic chronic kidney disease: Secondary | ICD-10-CM | POA: Insufficient documentation

## 2020-07-29 DIAGNOSIS — N186 End stage renal disease: Secondary | ICD-10-CM | POA: Insufficient documentation

## 2020-07-29 DIAGNOSIS — F17210 Nicotine dependence, cigarettes, uncomplicated: Secondary | ICD-10-CM | POA: Diagnosis not present

## 2020-07-29 DIAGNOSIS — Z79899 Other long term (current) drug therapy: Secondary | ICD-10-CM | POA: Insufficient documentation

## 2020-07-29 DIAGNOSIS — M5432 Sciatica, left side: Secondary | ICD-10-CM

## 2020-07-29 DIAGNOSIS — I120 Hypertensive chronic kidney disease with stage 5 chronic kidney disease or end stage renal disease: Secondary | ICD-10-CM | POA: Insufficient documentation

## 2020-07-29 DIAGNOSIS — R159 Full incontinence of feces: Secondary | ICD-10-CM | POA: Insufficient documentation

## 2020-07-29 DIAGNOSIS — M79605 Pain in left leg: Secondary | ICD-10-CM | POA: Diagnosis present

## 2020-07-29 LAB — CBC
HCT: 30.8 % — ABNORMAL LOW (ref 39.0–52.0)
Hemoglobin: 9.8 g/dL — ABNORMAL LOW (ref 13.0–17.0)
MCH: 34.3 pg — ABNORMAL HIGH (ref 26.0–34.0)
MCHC: 31.8 g/dL (ref 30.0–36.0)
MCV: 107.7 fL — ABNORMAL HIGH (ref 80.0–100.0)
Platelets: 122 10*3/uL — ABNORMAL LOW (ref 150–400)
RBC: 2.86 MIL/uL — ABNORMAL LOW (ref 4.22–5.81)
RDW: 15.9 % — ABNORMAL HIGH (ref 11.5–15.5)
WBC: 6.9 10*3/uL (ref 4.0–10.5)
nRBC: 0 % (ref 0.0–0.2)

## 2020-07-29 LAB — COMPREHENSIVE METABOLIC PANEL
ALT: 39 U/L (ref 0–44)
AST: 119 U/L — ABNORMAL HIGH (ref 15–41)
Albumin: 2.7 g/dL — ABNORMAL LOW (ref 3.5–5.0)
Alkaline Phosphatase: 64 U/L (ref 38–126)
Anion gap: 14 (ref 5–15)
BUN: 32 mg/dL — ABNORMAL HIGH (ref 6–20)
CO2: 28 mmol/L (ref 22–32)
Calcium: 8.7 mg/dL — ABNORMAL LOW (ref 8.9–10.3)
Chloride: 96 mmol/L — ABNORMAL LOW (ref 98–111)
Creatinine, Ser: 7.67 mg/dL — ABNORMAL HIGH (ref 0.61–1.24)
GFR, Estimated: 7 mL/min — ABNORMAL LOW (ref >60)
Glucose, Bld: 100 mg/dL — ABNORMAL HIGH (ref 70–99)
Potassium: 4 mmol/L (ref 3.5–5.1)
Sodium: 138 mmol/L (ref 135–145)
Total Bilirubin: 0.9 mg/dL (ref 0.3–1.2)
Total Protein: 8.6 g/dL — ABNORMAL HIGH (ref 6.5–8.1)

## 2020-07-29 LAB — PHOSPHORUS: Phosphorus: 6.1 mg/dL — ABNORMAL HIGH (ref 2.5–4.6)

## 2020-07-29 MED ORDER — OXYCODONE-ACETAMINOPHEN 5-325 MG PO TABS: 1.0000 | ORAL_TABLET | Freq: Four times a day (QID) | ORAL | 0 refills | Status: DC | PRN

## 2020-07-29 MED ORDER — DIPHENHYDRAMINE HCL 50 MG/ML IJ SOLN
12.5000 mg | Freq: Once | INTRAMUSCULAR | Status: AC
  Administered 2020-07-29: 12.5 mg via INTRAVENOUS
  Filled 2020-07-29: qty 1

## 2020-07-29 MED ORDER — ACETAMINOPHEN 325 MG PO TABS
650.0000 mg | ORAL_TABLET | Freq: Once | ORAL | Status: AC
  Administered 2020-07-29: 650 mg via ORAL
  Filled 2020-07-29: qty 2

## 2020-07-29 MED ORDER — PREDNISONE 20 MG PO TABS
60.0000 mg | ORAL_TABLET | Freq: Once | ORAL | Status: AC
  Administered 2020-07-29: 60 mg via ORAL
  Filled 2020-07-29: qty 3

## 2020-07-29 MED ORDER — GABAPENTIN 300 MG PO CAPS
300.0000 mg | ORAL_CAPSULE | Freq: Once | ORAL | Status: AC
  Administered 2020-07-29: 300 mg via ORAL
  Filled 2020-07-29: qty 1

## 2020-07-29 MED ORDER — PREDNISONE 20 MG PO TABS: 40.0000 mg | ORAL_TABLET | Freq: Every day | ORAL | 0 refills | Status: DC

## 2020-07-29 MED ORDER — OXYCODONE-ACETAMINOPHEN 5-325 MG PO TABS
2.0000 | ORAL_TABLET | Freq: Once | ORAL | Status: AC
  Administered 2020-07-29: 2 via ORAL
  Filled 2020-07-29: qty 2

## 2020-07-29 MED ORDER — HYDROMORPHONE HCL 1 MG/ML IJ SOLN
1.0000 mg | Freq: Once | INTRAMUSCULAR | Status: AC
  Administered 2020-07-29: 1 mg via INTRAMUSCULAR
  Filled 2020-07-29: qty 1

## 2020-07-29 MED ORDER — GABAPENTIN 600 MG PO TABS
300.0000 mg | ORAL_TABLET | Freq: Once | ORAL | Status: DC
  Filled 2020-07-29: qty 0.5

## 2020-07-29 NOTE — ED Provider Notes (Signed)
[facility] EMERGENCY DEPARTMENT Provider Note   CSN: [id number] Arrival date & time: 07/29/20  0059     History Chief Complaint  Patient presents with  . Leg Pain    Daniel [name] is a 60 y.o. male.  Patient with history of ESRD on dialysis, hypertension, hepatitis C presenting with a 1 day history of left leg pain.  Pain starts in his buttocks and radiates down his entire leg causing weakness due to pain.  No recent fall or injury.  He states the leg feels heavy and he is having difficulty walking.  He denies any back pain, fever, vomiting.  No abdominal pain, chest pain or shortness of breath.  He reports never having this pain in the past.  He is not taking anything for it at home.  The pain starts in his buttock his entire left leg.  There is no back pain.  There is no bladder incontinence.  Patient reports he did have 3 episodes of bowel incontinence over the past several days.  States he wakes up with stool in his bed and does not know how he got there.  No missed dialysis sessions.  Did not take any pain medication. He has never had this kind of pain before. He was seen in the ED several days ago for left wrist pain that was thought to be inflammatory in nature.  Reports this is unchanged.  The history is provided by the patient.  Leg Pain Associated symptoms: no back pain and no fever        Past Medical History:  Diagnosis Date  . Anemia   . Diabetes mellitus without complication ([place])    patient denies  . Dialysis patient ([place])   . End stage chronic kidney disease ([place])   . Hypertension   . ICH (intracerebral hemorrhage) ([place]) 05/20/2017  . Shoulder pain, left 06/28/2013    Patient Active Problem List   Diagnosis Date Noted  . ESRD (end stage renal disease) ([place])   . AVM (arteriovenous malformation) of small bowel, acquired with hemorrhage   . Symptomatic anemia 07/31/2019  . Acute on chronic anemia 07/23/2019  . Chronic kidney disease, stage  V ([place])   . Chronic hepatitis C without hepatic coma ([place]) 07/11/2019  . Iron deficiency anemia 03/30/2019  . CKD (chronic kidney disease), stage V ([place]) 03/30/2019  . Alcohol use disorder 03/30/2019  . B12 deficiency 03/30/2019  . Orthostatic hypotension 01/22/2019  . Anemia   . Hypertension 06/29/2016  . DM2 (diabetes mellitus, type 2) ([place]) 06/29/2016    Past Surgical History:  Procedure Laterality Date  . APPENDECTOMY    . AV FISTULA PLACEMENT Left 08/03/2019   Procedure: LEFT ARM ARTERIOVENOUS (AV) CEPHALIC  FISTULA CREATION;  Surgeon: Waynetta Sandy, MD;  Location: [facility];  Service: Vascular;  Laterality: Left;  . BIOPSY  06/30/2019   Procedure: BIOPSY;  Surgeon: Ronald Lobo, MD;  Location: [facility];  Service: Endoscopy;;  . BIOPSY  08/02/2019   Procedure: BIOPSY;  Surgeon: Yetta Flock, MD;  Location: [facility];  Service: Gastroenterology;;  . COLONOSCOPY  01/23/2012   Procedure: COLONOSCOPY;  Surgeon: Danie Binder, MD;  [facility];  Service: Endoscopy;  Laterality: N/A;  11:10 AM  . COLONOSCOPY WITH PROPOFOL N/A 06/30/2019   Procedure: COLONOSCOPY WITH PROPOFOL;  Surgeon: Ronald Lobo, MD;  Location: [facility];  Service: Endoscopy;  Laterality: N/A;  . ENTEROSCOPY N/A 08/02/2019   Procedure: ENTEROSCOPY;  Surgeon: Waynesville Cellar  P, MD;  Location: [facility];  Service: Gastroenterology;  Laterality: N/A;  . ESOPHAGOGASTRODUODENOSCOPY (EGD) WITH PROPOFOL N/A 06/30/2019   Procedure: ESOPHAGOGASTRODUODENOSCOPY (EGD) WITH PROPOFOL;  Surgeon: Ronald Lobo, MD;  Location: [facility];  Service: Endoscopy;  Laterality: N/A;  . FISTULA SUPERFICIALIZATION Left 10/17/2019   Procedure: LEFT UPPER EXTREMITY FISTULA REVISION, SIDE BRANCH LIGATION,  AND SUPERFICIALIZATION;  Surgeon: Marty Heck, MD;  Location: [facility];  Service: Vascular;  Laterality: Left;  . GIVENS CAPSULE STUDY N/A 06/30/2019   Procedure: GIVENS CAPSULE STUDY;  Surgeon:  Ronald Lobo, MD;  Location: [facility];  Service: Endoscopy;  Laterality: N/A;  . HEMOSTASIS CONTROL  08/02/2019   Procedure: HEMOSTASIS CONTROL;  Surgeon: Yetta Flock, MD;  Location: [facility];  Service: Gastroenterology;;  . INCISION AND DRAINAGE ABSCESS N/A 06/29/2016   Procedure: INCISION AND DRAINAGE ABDOMINAL WALL ABSCESS;  Surgeon: Alphonsa Overall, MD;  Location: [facility];  Service: General;  Laterality: N/A;  . INSERTION OF DIALYSIS CATHETER Right 08/03/2019   Procedure: INSERTION OF DIALYSIS CATHETER;  Surgeon: Waynetta Sandy, MD;  Location: [facility];  Service: Vascular;  Laterality: Right;  . INSERTION OF DIALYSIS CATHETER Right 10/22/2019   Procedure: INSERTION OF 23CM TUNNELED DIALYSIS CATHETER RIGHT INTERNAL JUGULAR;  Surgeon: Angelia Mould, MD;  Location: [facility];  Service: Vascular;  Laterality: Right;  . Left heel surgery    . PENILE BIOPSY N/A 03/26/2020   Procedure: PENILE ULCER DEBRIDEMENT;  Surgeon: Remi Haggard, MD;  Location: [facility];  Service: Urology;  Laterality: N/A;  30 MINS       Family History  Problem Relation Age of Onset  . Colon cancer Neg Hx     Social History   Tobacco Use  . Smoking status: Current Every Day Smoker    Packs/day: 1.00    Years: 30.00    Pack years: 30.00    Types: Cigarettes  . Smokeless tobacco: Never Used  Vaping Use  . Vaping Use: Never used  Substance Use Topics  . Alcohol use: Not Currently    Alcohol/week: 12.0 standard drinks    Types: 12 Cans of beer per week  . Drug use: Not Currently    Types: "Crack" cocaine    Comment: last in 2020    Home Medications Prior to Admission medications   Medication Sig Start Date End Date Taking? Authorizing Provider  amLODipine (NORVASC) 10 MG tablet Take 10 mg by mouth daily.    [provider]  amoxicillin-clavulanate (AUGMENTIN) 875-125 MG tablet Take 1 tablet by mouth 2 (two) times daily. One po bid x 7 days 07/07/20   Jacqlyn Larsen, PA-C   blood glucose meter kit and supplies KIT Dispense based on patient and insurance preference. Use up to four times daily as directed. (FOR ICD-9 250.00, 250.01). 07/01/16   Nita Sells, MD  Calcium Acetate 667 MG TABS Take 667-1,334 mg by mouth See admin instructions. Take 667 mg in the morning and afternoon and 1,334 in the evening 03/20/20   [provider]  cephALEXin (KEFLEX) 500 MG capsule Take 1 capsule (500 mg total) by mouth 2 (two) times daily for 7 days. 07/24/20 07/31/20  Noemi [facility], MD  cetirizine (ZYRTEC ALLERGY) 10 MG tablet Take 1 tablet (10 mg total) by mouth daily. 02/23/20   [name], NP  cloNIDine (CATAPRES) 0.2 MG tablet Take  1 tablet twice daily . If makes drowsy take 2 tablets - after he is off of work Patient taking differently:  Take 0.2 mg by mouth See admin instructions. If makes drowsy take 2 tablets - after he is off of work 07/13/19   [name], NP  colchicine 0.6 MG tablet Take 0.5 tablets (0.3 mg total) by mouth 2 (two) times daily. 07/24/20   Noemi [facility], MD  gabapentin (NEURONTIN) 300 MG capsule Take 1 capsule (300 mg total) by mouth 3 (three) times daily. Take 324m twice daily and 608mat bedtime. Patient taking differently: Take 300-600 mg by mouth See admin instructions. Take 30061mwice daily and 600m68m bedtime. 02/23/20   EdwaKerin Perna  HYDROcodone-acetaminophen (NORCO/VICODIN) 5-325 MG tablet Take 1 tablet by mouth every 4 (four) hours as needed for moderate pain. 05/29/20 05/29/21  SofiFransico Meadow-C  HYDROmorphone (DILAUDID) 2 MG tablet Take 1 tablet (2 mg total) by mouth every 4 (four) hours as needed for severe pain. 05/20/20   FloyDeno Etienne  hydrOXYzine (ATARAX/VISTARIL) 25 MG tablet Take 1 tablet (25 mg total) by mouth 2 (two) times daily as needed. Patient taking differently: Take 25 mg by mouth 2 (two) times daily as needed (Pain). 02/23/20   EdwaKerin Perna  lidocaine-prilocaine (EMLA) cream Apply  1 application topically daily as needed. 01/24/20   [provider]  mupirocin cream (BACTROBAN) 2 % Apply 1 application topically 2 (two) times daily. 04/30/20   HaviIsla Pence  mupirocin ointment (BACTROBAN) 2 % Apply 1 application topically 2 (two) times daily. 02/23/20   HaglVanessa Kick  oxyCODONE-acetaminophen (PERCOCET) 5-325 MG tablet Take 1 tablet by mouth every 4 (four) hours as needed for severe pain. 03/26/20 03/26/21  NewsRemi Haggard  oxyCODONE-acetaminophen (PERCOCET/ROXICET) 5-325 MG tablet Take 1 tablet by mouth every 4 (four) hours as needed. 03/21/20   [provider]  Pramoxine HCl 0.5 % OINT Apply 1 application topically 4 (four) times daily. 04/30/20   HaviIsla Pence  sulfamethoxazole-trimethoprim (BACTRIM) 400-80 MG tablet Take 1 tablet by mouth 2 (two) times daily. 04/30/20   HaviIsla Pence  terbinafine (LAMISIL) 1 % cream Apply 1 application topically 2 (two) times daily. 06/04/20   LaneVolney American-C  triamcinolone (KENALOG) 0.1 % Apply 1 application topically 2 (two) times daily.    [provider]  ferrous sulfate 325 (65 FE) MG tablet SMARTSIG:1 Tablet(s) By Mouth Twice Daily 08/11/19 02/09/20  [provider]  furosemide (LASIX) 40 MG tablet Take 1 tablet (40 mg total) by mouth daily. 07/25/19 02/09/20  JoneLadona Horns    Allergies    Patient has no known allergies.  Review of Systems   Review of Systems  Constitutional: Negative for activity change, appetite change and fever.  HENT: Negative for congestion and rhinorrhea.   Eyes: Negative for visual disturbance.  Respiratory: Negative for cough, chest tightness and shortness of breath.   Cardiovascular: Negative for chest pain.  Gastrointestinal: Negative for abdominal pain, nausea and vomiting.  Genitourinary: Negative for dysuria and hematuria.  Musculoskeletal: Positive for arthralgias and myalgias. Negative for back pain.  Neurological: Negative for  dizziness, weakness and headaches.   all other systems are negative except as noted in the HPI and PMH.   Physical Exam Updated Vital Signs BP (!) 146/86   Pulse 89   Temp 98.7 F (37.1 C) (Oral)   Resp 20   Ht 5' 11"  (1.803 m)   Wt 118.4 kg   SpO2 100%   BMI 36.41 kg/m   Physical Exam Vitals and nursing note  reviewed.  Constitutional:      General: He is not in acute distress.    Appearance: He is well-developed.  HENT:     Head: Normocephalic and atraumatic.     Mouth/Throat:     Pharynx: No oropharyngeal exudate.  Eyes:     Conjunctiva/sclera: Conjunctivae normal.     Pupils: Pupils are equal, round, and reactive to light.  Neck:     Comments: No meningismus. Cardiovascular:     Rate and Rhythm: Normal rate and regular rhythm.     Heart sounds: Normal heart sounds. No murmur heard.   Pulmonary:     Effort: Pulmonary effort is normal. No respiratory distress.     Breath sounds: Normal breath sounds.  Abdominal:     Palpations: Abdomen is soft.     Tenderness: There is no abdominal tenderness. There is no guarding or rebound.  Genitourinary:    Comments:  Normal rectal tone Musculoskeletal:        General: Tenderness present. Normal range of motion.     Cervical back: Normal range of motion and neck supple.     Comments: 4/5 strength in left lower extremity likely due to pain. 5/5 strength in R lower extremity. Ankle plantar and dorsiflexion intact. Great toe extension intact bilaterally. +2 DP and PT pulses. Unable to elicit patellar reflexes bilaterally. Intact DP and PT pulses  Skin:    General: Skin is warm.  Neurological:     Mental Status: He is alert and oriented to person, place, and time.     Cranial Nerves: No cranial nerve deficit.     Motor: No abnormal muscle tone.     Coordination: Coordination normal.     Comments: No ataxia on finger to nose bilaterally. No pronator drift. 5/5 strength throughout. CN 2-12 intact.Equal grip strength. Sensation  intact.   Psychiatric:        Behavior: Behavior normal.     ED Results / Procedures / Treatments   Labs (all labs ordered are listed, but only abnormal results are displayed) Labs Reviewed  CBC - Abnormal; Notable for the following components:      Result Value   RBC 2.86 (*)    Hemoglobin 9.8 (*)    HCT 30.8 (*)    MCV 107.7 (*)    MCH 34.3 (*)    RDW 15.9 (*)    Platelets 122 (*)    All other components within normal limits  COMPREHENSIVE METABOLIC PANEL - Abnormal; Notable for the following components:   Chloride 96 (*)    Glucose, Bld 100 (*)    BUN 32 (*)    Creatinine, Ser 7.67 (*)    Calcium 8.7 (*)    Total Protein 8.6 (*)    Albumin 2.7 (*)    AST 119 (*)    GFR, Estimated 7 (*)    All other components within normal limits  PHOSPHORUS - Abnormal; Notable for the following components:   Phosphorus 6.1 (*)    All other components within normal limits    EKG None  Radiology No results found.  Procedures Procedures   Medications Ordered in ED Medications  HYDROmorphone (DILAUDID) injection 1 mg (has no administration in time range)  acetaminophen (TYLENOL) tablet 650 mg (650 mg Oral Given 07/29/20 0111)    ED Course  I have reviewed the triage vital signs and the nursing notes.  Pertinent labs & imaging results that were available during my care of the patient were reviewed by me  and considered in my medical decision making (see chart for details).    MDM Rules/Calculators/A&P                          Patient here with left leg pain associated weakness and pain.  No fever.  Presentation consistent with sciatica however patient describes several episodes of bowel incontinence.  He is neurovascularly intact with distal pulses intact. Does have some weakness in the leg but is likely secondary to pain. MRI Will be obtained to evaluate for spinal cord pathology given incontinence  Labs reviewed.  No hyperkalemia.  MRI still pending at shift change.   MRI of thoracic and lumbar spine pending to evaluate for cord compression or cauda equina given patient sciatica symptoms with reported fecal incontinence Final Clinical Impression(s) / ED Diagnoses Final diagnoses:  Sciatica of left side  Incontinence of feces, unspecified fecal incontinence type    Rx / DC Orders ED Discharge Orders    None       Denver Bentson, Annie Main, MD 07/29/20 [phone]

## 2020-07-29 NOTE — ED Notes (Signed)
Patient transported to MRI 

## 2020-07-29 NOTE — Discharge Instructions (Addendum)
Do not take any colchicine for the gout as it will make your diarrhea worse.  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  We have sent medication to the pharmacy for pain and steroid to help with swelling.  Keep taking your gabapentin as well.  You will need to see pain management and the specialist to try to help fix this problem for you .

## 2020-07-29 NOTE — ED Triage Notes (Signed)
Left leg pain since yesterday. Pt reports gout but doesn't take any meds for it. Denies any recent falls/ trauma. Compliant with dialysis schedule, last session Saturday 07/28/20.

## 2020-07-29 NOTE — ED Notes (Signed)
PA Mia aware of pt in triage.

## 2020-07-29 NOTE — ED Provider Notes (Signed)
Assumed care from Dr. Wyvonnia Dusky at 7:30 AM.  Patient here with left buttocks and leg pain that started a few days ago.  He reports the pain is severe and worse when he is trying to walk.  Patient on exam has warm feet with palpable pulses.  No evidence of cellulitis at this time.  Patient is neurovascularly intact at this time.  Labs today without any acute changes.  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  Normal cord signaling.  Thoracic spine shows moderate sized paracentral disc extrusion at T6 and 7 which is chronic as well as T7-8 which might have increased since last year but no spinal cord signaling abnormalities.  He also has broad-based disc bulging at T9-10 and T12-L1 with foraminal stenosis most pronounced at the right T3-T4 left T9 and bilateral T10 and 11.  It appears that his MRI is not significantly changed from 1 year ago.  Patient has been working on getting referral to pain management.  He does take Neurontin at home but has been getting narcotic prescriptions here and there as his PCP will not fill them.  Patient reports he is not sure what he is going to do because the pain is so severe.  Patient given oral pain control here as well as gabapentin and steroids.  Will ensure patient is able to ambulate before discharge.  Does not appear to have a acute spinal emergency.  10:43 AM Patient is able to ambulate here although he does have some pain he does not appear to be unstable on his feet or a fall risk.  He was given a prescription for steroids as well as a short course of opiate.  He last filled a prescription 2 months ago.  He is currently waiting for referral to a  pain clinic.  He was also given information for neurosurgery.   Blanchie Dessert, MD 07/29/20 1044

## 2020-07-29 NOTE — ED Notes (Signed)
Pt returned from MRI °

## 2020-07-29 NOTE — ED Provider Notes (Signed)
MSE was initiated and I personally evaluated the patient and placed orders (if any) at  3:28 AM on July 29, 2020.  The patient appears stable so that the remainder of the MSE may be completed by another provider.  HPI: 60 year old male with a history of ESRD, gout, and hyperphosphatemia, diabetes mellitus type 2 who presents emergency department with a chief complaint of left leg pain and weakness.  The patient reports symptoms began yesterday.  No recent falls or injuries.  He states that his leg feels heavy and he has had difficulty walking.  No numbness, back pain, fevers, chills, abdominal pain, chest pain, shortness of breath.  He does report that he has had a few episodes of fecal incontinence that are new over the last few days.  He was last fully dialyzed on 07/28/20.  No treatment for pain prior to arrival.  Physical exam: Patient is unable to extend the left leg out straight.  5 of 5 strength against resistance and full extension of the right leg is intact.  DP and PT pulses are 2+ and symmetric.  Sensation is intact and equal to bilateral lower extremities.  Mild tenderness palpation to the thoracic spine, but no lumbar spinous process tenderness, crepitus, or step-offs.  Vitals:   07/29/20 0108  BP: (!) 139/116  Pulse: 100  Resp: 18  Temp: 98.7 F (37.1 C)  SpO2: 95%    Discussed with patient that there care has been initiated.   Patient has been counseled on need to stay for full evaluation, including full H&P and physical exam.  Risks of leaving the emergency department prior to completion of treatment were discussed. Patient was advised to inform ED staff if they are leaving before treatment is complete. The patient acknowledged these risks and time was allowed for questions.      Joanne Gavel, PA-C 07/29/20 0328    Ezequiel Essex, MD 07/29/20 [phone]

## 2020-07-29 NOTE — ED Notes (Signed)
Pt reports that he is feeling itchy all over. MD made aware. Med orders to be put in

## 2020-08-04 ENCOUNTER — Other Ambulatory Visit: Payer: Self-pay

## 2020-08-04 ENCOUNTER — Emergency Department (HOSPITAL_COMMUNITY): Payer: Medicare Other

## 2020-08-04 ENCOUNTER — Emergency Department (HOSPITAL_COMMUNITY)
Admission: EM | Admit: 2020-08-04 | Discharge: 2020-08-04 | Disposition: A | Discharge status: Home / Self Care | Payer: Medicare Other | Attending: Emergency Medicine | Admitting: Emergency Medicine

## 2020-08-04 ENCOUNTER — Encounter (HOSPITAL_COMMUNITY): Payer: Self-pay

## 2020-08-04 DIAGNOSIS — M79672 Pain in left foot: Secondary | ICD-10-CM | POA: Diagnosis not present

## 2020-08-04 DIAGNOSIS — M79671 Pain in right foot: Secondary | ICD-10-CM | POA: Diagnosis not present

## 2020-08-04 DIAGNOSIS — E871 Hypo-osmolality and hyponatremia: Secondary | ICD-10-CM | POA: Diagnosis not present

## 2020-08-04 DIAGNOSIS — R0602 Shortness of breath: Secondary | ICD-10-CM | POA: Diagnosis present

## 2020-08-04 DIAGNOSIS — Z992 Dependence on renal dialysis: Secondary | ICD-10-CM | POA: Insufficient documentation

## 2020-08-04 DIAGNOSIS — N186 End stage renal disease: Secondary | ICD-10-CM | POA: Diagnosis not present

## 2020-08-04 DIAGNOSIS — E1122 Type 2 diabetes mellitus with diabetic chronic kidney disease: Secondary | ICD-10-CM | POA: Insufficient documentation

## 2020-08-04 DIAGNOSIS — I120 Hypertensive chronic kidney disease with stage 5 chronic kidney disease or end stage renal disease: Secondary | ICD-10-CM | POA: Diagnosis not present

## 2020-08-04 DIAGNOSIS — D631 Anemia in chronic kidney disease: Secondary | ICD-10-CM

## 2020-08-04 DIAGNOSIS — D696 Thrombocytopenia, unspecified: Secondary | ICD-10-CM | POA: Diagnosis not present

## 2020-08-04 DIAGNOSIS — Z79899 Other long term (current) drug therapy: Secondary | ICD-10-CM | POA: Insufficient documentation

## 2020-08-04 DIAGNOSIS — F17210 Nicotine dependence, cigarettes, uncomplicated: Secondary | ICD-10-CM | POA: Diagnosis not present

## 2020-08-04 DIAGNOSIS — N189 Chronic kidney disease, unspecified: Secondary | ICD-10-CM

## 2020-08-04 LAB — BASIC METABOLIC PANEL
Anion gap: 17 — ABNORMAL HIGH (ref 5–15)
BUN: 87 mg/dL — ABNORMAL HIGH (ref 6–20)
CO2: 25 mmol/L (ref 22–32)
Calcium: 8.4 mg/dL — ABNORMAL LOW (ref 8.9–10.3)
Chloride: 90 mmol/L — ABNORMAL LOW (ref 98–111)
Creatinine, Ser: 11.5 mg/dL — ABNORMAL HIGH (ref 0.61–1.24)
GFR, Estimated: 5 mL/min — ABNORMAL LOW (ref >60)
Glucose, Bld: 112 mg/dL — ABNORMAL HIGH (ref 70–99)
Potassium: 5 mmol/L (ref 3.5–5.1)
Sodium: 132 mmol/L — ABNORMAL LOW (ref 135–145)

## 2020-08-04 LAB — CBC
HCT: 27.2 % — ABNORMAL LOW (ref 39.0–52.0)
Hemoglobin: 8.7 g/dL — ABNORMAL LOW (ref 13.0–17.0)
MCH: 34.3 pg — ABNORMAL HIGH (ref 26.0–34.0)
MCHC: 32 g/dL (ref 30.0–36.0)
MCV: 107.1 fL — ABNORMAL HIGH (ref 80.0–100.0)
Platelets: 149 10*3/uL — ABNORMAL LOW (ref 150–400)
RBC: 2.54 MIL/uL — ABNORMAL LOW (ref 4.22–5.81)
RDW: 16.4 % — ABNORMAL HIGH (ref 11.5–15.5)
WBC: 7.2 10*3/uL (ref 4.0–10.5)
nRBC: 0.6 % — ABNORMAL HIGH (ref 0.0–0.2)

## 2020-08-04 LAB — TROPONIN I (HIGH SENSITIVITY)
Troponin I (High Sensitivity): 24 ng/L — ABNORMAL HIGH (ref <18)
Troponin I (High Sensitivity): 24 ng/L — ABNORMAL HIGH (ref <18)

## 2020-08-04 MED ORDER — OXYCODONE-ACETAMINOPHEN 5-325 MG PO TABS
1.0000 | ORAL_TABLET | Freq: Once | ORAL | Status: AC
  Administered 2020-08-04: 1 via ORAL
  Filled 2020-08-04: qty 1

## 2020-08-04 MED ORDER — OXYCODONE-ACETAMINOPHEN 5-325 MG PO TABS: 1.0000 | ORAL_TABLET | Freq: Four times a day (QID) | ORAL | 0 refills | Status: DC | PRN

## 2020-08-04 MED ORDER — ALBUTEROL SULFATE HFA 108 (90 BASE) MCG/ACT IN AERS: 2.0000 | INHALATION_SPRAY | RESPIRATORY_TRACT | Status: DC | PRN

## 2020-08-04 NOTE — ED Triage Notes (Signed)
Emergency Medicine Provider Triage Evaluation Note  Daniel [name] , a 60 y.o. male  was evaluated in triage.  Pt complains of bilateral foot pain and peeling since yesterday. No hx of trauma. No medications taken PTA. Also c/o SOB. Last dialyzed on Tuesday; missed Thursday's session. Denies fever, chest pain, vomiting, nausea.  Review of Systems  Positive: Foot pain, SOB Negative: Fever, chest pain, vomiting  Physical Exam  BP (!) 176/101 (BP Location: Right Arm)   Pulse 84   Temp 98 F (36.7 C) (Oral)   Resp 20   Ht 5\' 11"  (1.803 m)   Wt 120.2 kg   SpO2 93%   BMI 36.96 kg/m  Gen:   Awake, no distress   HEENT:  Atraumatic  Resp:  Normal effort; lungs CTAB Cardiac:  Normal rate  Abd:   Nondistended MSK:   Moves extremities without difficulty. Foot compartments soft. No evidence of cellulitis or abscess. Peeling skin noted to plantar aspect b/l feet. Neuro:  Speech clear   Medical Decision Making  Medically screening exam initiated at 1:28 AM.  Appropriate orders placed.  Jaishawn C Petropoulos was informed that the remainder of the evaluation will be completed by another provider, this initial triage assessment does not replace that evaluation, and the importance of remaining in the ED until their evaluation is complete.  Clinical Impression  SOB, foot pain   Antonietta Breach, PA-C 08/04/20 0131

## 2020-08-04 NOTE — ED Triage Notes (Signed)
Pt reports that his feet are hurting. Pt reports that he did not go to dialysis at all last week and he is SOB. Denies nausea/vomiting. Has dialysis on Tues, Thurs, and Sat.

## 2020-08-04 NOTE — ED Notes (Signed)
ED Provider at bedside. 

## 2020-08-04 NOTE — Discharge Instructions (Addendum)
Make sure you go to your dialysis session today.  It is very important that you not miss dialysis sessions.  Please make an appointment to see the podiatrist regarding your foot pain.  You also should be evaluated by the vascular surgeon to see if bad circulation is causing some of your foot pain.  You have been coming to the emergency department to get pain medication.  Please discuss with your primary care provider possible referral to a pain management clinic.  It would be much better for you to get your narcotic prescriptions from a single provider.

## 2020-08-04 NOTE — ED Notes (Signed)
Received pt at this time from lobby per wheelchair.

## 2020-08-04 NOTE — ED Provider Notes (Signed)
[facility] EMERGENCY DEPARTMENT Provider Note   CSN: [id number] Arrival date & time: 08/04/20  0042   History Chief complaint: Pain in both feet  Daniel [name] is a 61 y.o. male.  The history is provided by the patient.  He has history of hypertension, diabetes, end-stage renal disease on hemodialysis and comes in complaining of bilateral foot pain and also of shortness of breath.  His foot pain is diffuse and he rates it at 10/10.  He states it just started yesterday.  He initially stated that he had not taken anything for pain, but when I suggested that he needed to try acetaminophen, he stated it did not work.  He states that he had taken acetaminophen 2 weeks ago.  I did point out to him that he stated his feet started hurting yesterday at which point he states that it has actually been an ongoing problem.  He denies any trauma.  He cannot characterize the pain.  He has noted that the skin on his feet is cracking and peeling.  He he did miss his dialysis session 2 days ago and is noticing a little bit of shortness of breath today.  He denies any chest pain, heaviness, tightness, pressure.  Past Medical History:  Diagnosis Date  . Anemia   . Diabetes mellitus without complication ([place])    patient denies  . Dialysis patient ([place])   . End stage chronic kidney disease ([place])   . Hypertension   . ICH (intracerebral hemorrhage) ([place]) 05/20/2017  . Shoulder pain, left 06/28/2013    Patient Active Problem List   Diagnosis Date Noted  . ESRD (end stage renal disease) ([place])   . AVM (arteriovenous malformation) of small bowel, acquired with hemorrhage   . Symptomatic anemia 07/31/2019  . Acute on chronic anemia 07/23/2019  . Chronic kidney disease, stage V ([place])   . Chronic hepatitis C without hepatic coma ([place]) 07/11/2019  . Iron deficiency anemia 03/30/2019  . CKD (chronic kidney disease), stage V ([place]) 03/30/2019  . Alcohol use disorder 03/30/2019  . B12  deficiency 03/30/2019  . Orthostatic hypotension 01/22/2019  . Anemia   . Hypertension 06/29/2016  . DM2 (diabetes mellitus, type 2) ([place]) 06/29/2016    Past Surgical History:  Procedure Laterality Date  . APPENDECTOMY    . AV FISTULA PLACEMENT Left 08/03/2019   Procedure: LEFT ARM ARTERIOVENOUS (AV) CEPHALIC  FISTULA CREATION;  Surgeon: Waynetta Sandy, MD;  Location: [facility];  Service: Vascular;  Laterality: Left;  . BIOPSY  06/30/2019   Procedure: BIOPSY;  Surgeon: Ronald Lobo, MD;  Location: [facility];  Service: Endoscopy;;  . BIOPSY  08/02/2019   Procedure: BIOPSY;  Surgeon: Yetta Flock, MD;  Location: [facility];  Service: Gastroenterology;;  . COLONOSCOPY  01/23/2012   Procedure: COLONOSCOPY;  Surgeon: Danie Binder, MD;  [facility];  Service: Endoscopy;  Laterality: N/A;  11:10 AM  . COLONOSCOPY WITH PROPOFOL N/A 06/30/2019   Procedure: COLONOSCOPY WITH PROPOFOL;  Surgeon: Ronald Lobo, MD;  Location: [facility];  Service: Endoscopy;  Laterality: N/A;  . ENTEROSCOPY N/A 08/02/2019   Procedure: ENTEROSCOPY;  Surgeon: Yetta Flock, MD;  Location: [facility] ENDOSCOPY;  Service: Gastroenterology;  Laterality: N/A;  . ESOPHAGOGASTRODUODENOSCOPY (EGD) WITH PROPOFOL N/A 06/30/2019   Procedure: ESOPHAGOGASTRODUODENOSCOPY (EGD) WITH PROPOFOL;  Surgeon: Ronald Lobo, MD;  Location: [facility];  Service: Endoscopy;  Laterality: N/A;  . FISTULA SUPERFICIALIZATION Left 10/17/2019   Procedure: LEFT UPPER EXTREMITY FISTULA REVISION,  SIDE BRANCH LIGATION,  AND SUPERFICIALIZATION;  Surgeon: Marty Heck, MD;  Location: [facility];  Service: Vascular;  Laterality: Left;  . GIVENS CAPSULE STUDY N/A 06/30/2019   Procedure: GIVENS CAPSULE STUDY;  Surgeon: Ronald Lobo, MD;  Location[facility] ENDOSCOPY;  Service: Endoscopy;  Laterality: N/A;  . HEMOSTASIS CONTROL  08/02/2019   Procedure: HEMOSTASIS CONTROL;  Surgeon: Yetta Flock, MD;  Location: [facility] ENDOSCOPY;   Service: Gastroenterology;;  . INCISION AND DRAINAGE ABSCESS N/A 06/29/2016   Procedure: INCISION AND DRAINAGE ABDOMINAL WALL ABSCESS;  Surgeon: Alphonsa Overall, MD;  Location: [facility];  Service: General;  Laterality: N/A;  . INSERTION OF DIALYSIS CATHETER Right 08/03/2019   Procedure: INSERTION OF DIALYSIS CATHETER;  Surgeon: Waynetta Sandy, MD;  Location: [facility];  Service: Vascular;  Laterality: Right;  . INSERTION OF DIALYSIS CATHETER Right 10/22/2019   Procedure: INSERTION OF 23CM TUNNELED DIALYSIS CATHETER RIGHT INTERNAL JUGULAR;  Surgeon: Angelia Mould, MD;  Location: [facility];  Service: Vascular;  Laterality: Right;  . Left heel surgery    . PENILE BIOPSY N/A 03/26/2020   Procedure: PENILE ULCER DEBRIDEMENT;  Surgeon: Remi Haggard, MD;  Location: [facility];  Service: Urology;  Laterality: N/A;  30 MINS       Family History  Problem Relation Age of Onset  . Colon cancer Neg Hx     Social History   Tobacco Use  . Smoking status: Current Every Day Smoker    Packs/day: 1.00    Years: 30.00    Pack years: 30.00    Types: Cigarettes  . Smokeless tobacco: Never Used  Vaping Use  . Vaping Use: Never used  Substance Use Topics  . Alcohol use: Not Currently    Alcohol/week: 12.0 standard drinks    Types: 12 Cans of beer per week  . Drug use: Not Currently    Types: "Crack" cocaine    Comment: last in 2020    Home Medications Prior to Admission medications   Medication Sig Start Date End Date Taking? Authorizing Provider  amLODipine (NORVASC) 10 MG tablet Take 10 mg by mouth daily.    [provider]  blood glucose meter kit and supplies KIT Dispense based on patient and insurance preference. Use up to four times daily as directed. (FOR ICD-9 250.00, 250.01). 07/01/16   Nita Sells, MD  Calcium Acetate 667 MG TABS Take 667-1,334 mg by mouth See admin instructions. Take 667 mg in the morning and afternoon and 1,334 in the evening 03/20/20   [provider]  cetirizine (ZYRTEC ALLERGY) 10 MG tablet Take 1 tablet (10 mg total) by mouth daily. Patient taking differently: Take 10 mg by mouth daily as needed for allergies. 02/23/20   [name], NP  cloNIDine (CATAPRES) 0.2 MG tablet Take  1 tablet twice daily . If makes drowsy take 2 tablets - after he is off of work Patient taking differently: Take 0.2 mg by mouth See admin instructions. If makes drowsy take 2 tablets - after he is off of work 07/13/19   [name], NP  gabapentin (NEURONTIN) 300 MG capsule Take 1 capsule (300 mg total) by mouth 3 (three) times daily. Take 332m twice daily and 6030mat bedtime. Patient taking differently: Take 300-600 mg by mouth See admin instructions. Take 30067mwice daily and 600m72m bedtime. 02/23/20   EdwaKerin Perna  hydrOXYzine (ATARAX/VISTARIL) 25 MG tablet Take 1 tablet (25 mg total) by mouth 2 (two) times daily  as needed. Patient taking differently: Take 25 mg by mouth 2 (two) times daily as needed for anxiety. 02/23/20   [name], NP  lidocaine-prilocaine (EMLA) cream Apply 1 application topically daily as needed (pain). 01/24/20   [provider]  mupirocin cream (BACTROBAN) 2 % Apply 1 application topically 2 (two) times daily. Patient not taking: No sig reported 04/30/20   Isla Pence, MD  oxyCODONE-acetaminophen (PERCOCET/ROXICET) 5-325 MG tablet Take 1 tablet by mouth every 6 (six) hours as needed for severe pain. 07/29/20   Alroy Bailiff, [place], PA-C  Pramoxine HCl 0.5 % OINT Apply 1 application topically 4 (four) times daily. Patient not taking: No sig reported 04/30/20   Isla Pence, MD  predniSONE (DELTASONE) 20 MG tablet Take 2 tablets (40 mg total) by mouth daily. 07/29/20   Blanchie Dessert, MD  sulfamethoxazole-trimethoprim (BACTRIM) 400-80 MG tablet Take 1 tablet by mouth 2 (two) times daily. Patient not taking: Reported on 07/29/2020 04/30/20   Isla Pence, MD  terbinafine (LAMISIL) 1 %  cream Apply 1 application topically 2 (two) times daily. Patient not taking: No sig reported 06/04/20   Volney American, PA-C  colchicine 0.6 MG tablet Take 0.5 tablets (0.3 mg total) by mouth 2 (two) times daily. 07/24/20 07/29/20  Noemi Chapel, MD  ferrous sulfate 325 (65 FE) MG tablet SMARTSIG:1 Tablet(s) By Mouth Twice Daily 08/11/19 02/09/20  [provider]  furosemide (LASIX) 40 MG tablet Take 1 tablet (40 mg total) by mouth daily. 07/25/19 02/09/20  Ladona Horns, MD    Allergies    Dilaudid [hydromorphone hcl]  Review of Systems   Review of Systems  All other systems reviewed and are negative.   Physical Exam Updated Vital Signs BP (!) 167/101   Pulse 78   Temp 98 F (36.7 C) (Oral)   Resp 15   Ht 5' 11"  (1.803 m)   Wt 120.2 kg   SpO2 98%   BMI 36.96 kg/m   Physical Exam Vitals and nursing note reviewed.   60 year old male, resting comfortably and in no acute distress. Vital signs are significant for elevated blood pressure. Oxygen saturation is 98%, which is normal. Head is normocephalic and atraumatic. PERRLA, EOMI. Oropharynx is clear. Neck is nontender and supple without adenopathy or JVD. Back is nontender and there is no CVA tenderness. Lungs have bibasilar rales without wheezes or rhonchi. Chest is nontender. Heart has regular rate and rhythm without murmur. Abdomen is soft, protuberant, nontender without masses or hepatosplenomegaly and peristalsis is normoactive. Extremities: There is 1-2+ pretibial and pedal edema.  Feet are warm with slightly delayed capillary refill of 3 seconds which is symmetric.  Dorsalis pedis pulses are present bilaterally.  There is some cracking of the skin of the plantar surface of both feet, but no open sores. Skin is warm and dry without rash. Neurologic: Mental status is normal, cranial nerves are intact, moves all extremities equally.  ED Results / Procedures / Treatments   Labs (all labs ordered are listed, but  only abnormal results are displayed) Labs Reviewed  BASIC METABOLIC PANEL - Abnormal; Notable for the following components:      Result Value   Sodium 132 (*)    Chloride 90 (*)    Glucose, Bld 112 (*)    BUN 87 (*)    Creatinine, Ser 11.50 (*)    Calcium 8.4 (*)    GFR, Estimated 5 (*)    Anion gap 17 (*)    All other components  within normal limits  CBC - Abnormal; Notable for the following components:   RBC 2.54 (*)    Hemoglobin 8.7 (*)    HCT 27.2 (*)    MCV 107.1 (*)    MCH 34.3 (*)    RDW 16.4 (*)    Platelets 149 (*)    nRBC 0.6 (*)    All other components within normal limits  TROPONIN I (HIGH SENSITIVITY) - Abnormal; Notable for the following components:   Troponin I (High Sensitivity) 24 (*)    All other components within normal limits  TROPONIN I (HIGH SENSITIVITY) - Abnormal; Notable for the following components:   Troponin I (High Sensitivity) 24 (*)    All other components within normal limits   Radiology DG Chest 2 View  Result Date: 08/04/2020 CLINICAL DATA:  Shortness of breath with bilateral foot pain. EXAM: CHEST - 2 VIEW COMPARISON:  July 07, 2020 FINDINGS: Mild, diffuse, chronic appearing increased interstitial lung markings are seen. There is no evidence of focal consolidation, pleural effusion or pneumothorax. The cardiac silhouette is mildly enlarged. Mild to moderate severity calcification of the aortic arch is seen. The visualized skeletal structures are unremarkable. IMPRESSION: Chronic appearing increased interstitial lung markings without evidence of acute or active cardiopulmonary disease. Electronically Signed   By: Virgina Norfolk M.D.   On: 08/04/2020 02:25    Procedures Procedures   Medications Ordered in ED Medications  albuterol (VENTOLIN HFA) 108 (90 Base) MCG/ACT inhaler 2 puff (has no administration in time range)  oxyCODONE-acetaminophen (PERCOCET/ROXICET) 5-325 MG per tablet 1 tablet (1 tablet Oral Given 08/04/20 0501)    ED  Course  I have reviewed the triage vital signs and the nursing notes.  Pertinent labs & imaging results that were available during my care of the patient were reviewed by me and considered in my medical decision making (see chart for details).  MDM Rules/Calculators/A&P Bilateral foot pain.  This appears to be at least partly from a peripheral vascular disease.  No evidence of acute vascular compromise.  Dyspnea is secondary to fluid overload in patient has missed his dialysis.  Chest x-ray does not show pulmonary edema, but edema is noted by exam.  I have impressed on the patient the importance of his going for his dialysis session today.  Labs have been obtained showing stable anemia, mild hyponatremia, mild chronic elevation of troponin which was unchanged when repeated, and mild thrombocytopenia.  I did review his record on the [place] controlled substance reporting website, and he has been getting short term narcotic prescriptions fairly frequently with the most recent being on 4/3 which time he was given prescription for 15 oxycodone-acetaminophen 5-325 tablets.  I have discussed with the patient the need for him to get his narcotic prescriptions from one source and suggested that he try to get established with chronic pain clinic.  I have encouraged him to follow-up with podiatry and with vascular surgery regarding his ongoing foot pain but I suspect that there will not be much that can be done for it.  Final Clinical Impression(s) / ED Diagnoses Final diagnoses:  Bilateral foot pain  Shortness of breath  End-stage renal disease on hemodialysis (HCC)  Anemia associated with chronic renal failure  Hyponatremia  Thrombocytopenia ([place])    Rx / DC Orders ED Discharge Orders         Ordered    oxyCODONE-acetaminophen (PERCOCET/ROXICET) 5-325 MG tablet  Every 6 hours PRN        08/04/20 0446  Delora Fuel, MD 95/74/73 [id number]

## 2020-08-09 ENCOUNTER — Other Ambulatory Visit: Payer: Self-pay

## 2020-08-09 ENCOUNTER — Encounter (HOSPITAL_COMMUNITY): Payer: Self-pay | Admitting: Emergency Medicine

## 2020-08-09 ENCOUNTER — Inpatient Hospital Stay (HOSPITAL_COMMUNITY)
Admission: EM | Admit: 2020-08-09 | Discharge: 2020-08-16 | DRG: 356 | Disposition: A | Discharge status: Home Health | Payer: Medicare Other | Attending: Internal Medicine | Admitting: Internal Medicine

## 2020-08-09 ENCOUNTER — Emergency Department (HOSPITAL_COMMUNITY)
Admission: EM | Admit: 2020-08-09 | Discharge: 2020-08-09 | Discharge status: Against Medical Advice | Payer: Medicare Other | Source: Home / Self Care | Attending: Emergency Medicine | Admitting: Emergency Medicine

## 2020-08-09 ENCOUNTER — Ambulatory Visit (HOSPITAL_COMMUNITY)
Admission: EM | Admit: 2020-08-09 | Discharge: 2020-08-09 | Disposition: A | Discharge status: Home / Self Care | Payer: Medicare Other

## 2020-08-09 ENCOUNTER — Encounter (HOSPITAL_COMMUNITY): Payer: Medicare Other

## 2020-08-09 DIAGNOSIS — Z79899 Other long term (current) drug therapy: Secondary | ICD-10-CM | POA: Insufficient documentation

## 2020-08-09 DIAGNOSIS — K922 Gastrointestinal hemorrhage, unspecified: Secondary | ICD-10-CM | POA: Diagnosis not present

## 2020-08-09 DIAGNOSIS — I10 Essential (primary) hypertension: Secondary | ICD-10-CM | POA: Diagnosis present

## 2020-08-09 DIAGNOSIS — T82858A Stenosis of vascular prosthetic devices, implants and grafts, initial encounter: Secondary | ICD-10-CM | POA: Diagnosis present

## 2020-08-09 DIAGNOSIS — D62 Acute posthemorrhagic anemia: Secondary | ICD-10-CM | POA: Diagnosis present

## 2020-08-09 DIAGNOSIS — F17210 Nicotine dependence, cigarettes, uncomplicated: Secondary | ICD-10-CM | POA: Insufficient documentation

## 2020-08-09 DIAGNOSIS — Z992 Dependence on renal dialysis: Secondary | ICD-10-CM

## 2020-08-09 DIAGNOSIS — Y712 Prosthetic and other implants, materials and accessory cardiovascular devices associated with adverse incidents: Secondary | ICD-10-CM | POA: Diagnosis present

## 2020-08-09 DIAGNOSIS — N2581 Secondary hyperparathyroidism of renal origin: Secondary | ICD-10-CM | POA: Diagnosis present

## 2020-08-09 DIAGNOSIS — E1122 Type 2 diabetes mellitus with diabetic chronic kidney disease: Secondary | ICD-10-CM | POA: Insufficient documentation

## 2020-08-09 DIAGNOSIS — I120 Hypertensive chronic kidney disease with stage 5 chronic kidney disease or end stage renal disease: Secondary | ICD-10-CM | POA: Insufficient documentation

## 2020-08-09 DIAGNOSIS — N186 End stage renal disease: Secondary | ICD-10-CM | POA: Insufficient documentation

## 2020-08-09 DIAGNOSIS — E669 Obesity, unspecified: Secondary | ICD-10-CM | POA: Diagnosis present

## 2020-08-09 DIAGNOSIS — K260 Acute duodenal ulcer with hemorrhage: Principal | ICD-10-CM | POA: Diagnosis present

## 2020-08-09 DIAGNOSIS — M79622 Pain in left upper arm: Secondary | ICD-10-CM | POA: Diagnosis not present

## 2020-08-09 DIAGNOSIS — K2970 Gastritis, unspecified, without bleeding: Secondary | ICD-10-CM | POA: Diagnosis present

## 2020-08-09 DIAGNOSIS — D649 Anemia, unspecified: Secondary | ICD-10-CM | POA: Diagnosis present

## 2020-08-09 DIAGNOSIS — T829XXA Unspecified complication of cardiac and vascular prosthetic device, implant and graft, initial encounter: Secondary | ICD-10-CM

## 2020-08-09 DIAGNOSIS — R52 Pain, unspecified: Secondary | ICD-10-CM

## 2020-08-09 DIAGNOSIS — M10031 Idiopathic gout, right wrist: Secondary | ICD-10-CM | POA: Diagnosis not present

## 2020-08-09 DIAGNOSIS — E1151 Type 2 diabetes mellitus with diabetic peripheral angiopathy without gangrene: Secondary | ICD-10-CM | POA: Diagnosis present

## 2020-08-09 DIAGNOSIS — R600 Localized edema: Secondary | ICD-10-CM | POA: Insufficient documentation

## 2020-08-09 DIAGNOSIS — Z20822 Contact with and (suspected) exposure to covid-19: Secondary | ICD-10-CM | POA: Diagnosis present

## 2020-08-09 DIAGNOSIS — Z419 Encounter for procedure for purposes other than remedying health state, unspecified: Secondary | ICD-10-CM

## 2020-08-09 DIAGNOSIS — D631 Anemia in chronic kidney disease: Secondary | ICD-10-CM | POA: Diagnosis present

## 2020-08-09 DIAGNOSIS — M25422 Effusion, left elbow: Secondary | ICD-10-CM

## 2020-08-09 DIAGNOSIS — K2980 Duodenitis without bleeding: Secondary | ICD-10-CM | POA: Diagnosis present

## 2020-08-09 DIAGNOSIS — Y733 Surgical instruments, materials and gastroenterology and urology devices (including sutures) associated with adverse incidents: Secondary | ICD-10-CM | POA: Insufficient documentation

## 2020-08-09 DIAGNOSIS — L298 Other pruritus: Secondary | ICD-10-CM | POA: Diagnosis not present

## 2020-08-09 DIAGNOSIS — Z6837 Body mass index (BMI) 37.0-37.9, adult: Secondary | ICD-10-CM

## 2020-08-09 DIAGNOSIS — T82590A Other mechanical complication of surgically created arteriovenous fistula, initial encounter: Secondary | ICD-10-CM | POA: Insufficient documentation

## 2020-08-09 DIAGNOSIS — K921 Melena: Secondary | ICD-10-CM

## 2020-08-09 DIAGNOSIS — T82510A Breakdown (mechanical) of surgically created arteriovenous fistula, initial encounter: Secondary | ICD-10-CM | POA: Diagnosis present

## 2020-08-09 DIAGNOSIS — L853 Xerosis cutis: Secondary | ICD-10-CM | POA: Diagnosis present

## 2020-08-09 DIAGNOSIS — B182 Chronic viral hepatitis C: Secondary | ICD-10-CM | POA: Diagnosis present

## 2020-08-09 DIAGNOSIS — Y832 Surgical operation with anastomosis, bypass or graft as the cause of abnormal reaction of the patient, or of later complication, without mention of misadventure at the time of the procedure: Secondary | ICD-10-CM | POA: Diagnosis present

## 2020-08-09 DIAGNOSIS — K5730 Diverticulosis of large intestine without perforation or abscess without bleeding: Secondary | ICD-10-CM | POA: Diagnosis present

## 2020-08-09 DIAGNOSIS — Z95828 Presence of other vascular implants and grafts: Secondary | ICD-10-CM

## 2020-08-09 DIAGNOSIS — Z885 Allergy status to narcotic agent status: Secondary | ICD-10-CM

## 2020-08-09 DIAGNOSIS — K264 Chronic or unspecified duodenal ulcer with hemorrhage: Secondary | ICD-10-CM

## 2020-08-09 DIAGNOSIS — E119 Type 2 diabetes mellitus without complications: Secondary | ICD-10-CM

## 2020-08-09 LAB — CBC WITH DIFFERENTIAL/PLATELET
Abs Immature Granulocytes: 0 10*3/uL (ref 0.00–0.07)
Basophils Absolute: 0.1 10*3/uL (ref 0.0–0.1)
Basophils Relative: 1 %
Eosinophils Absolute: 0 10*3/uL (ref 0.0–0.5)
Eosinophils Relative: 0 %
HCT: 22.8 % — ABNORMAL LOW (ref 39.0–52.0)
Hemoglobin: 7.1 g/dL — ABNORMAL LOW (ref 13.0–17.0)
Lymphocytes Relative: 37 %
Lymphs Abs: 2.1 10*3/uL (ref 0.7–4.0)
MCH: 34.8 pg — ABNORMAL HIGH (ref 26.0–34.0)
MCHC: 31.1 g/dL (ref 30.0–36.0)
MCV: 111.8 fL — ABNORMAL HIGH (ref 80.0–100.0)
Monocytes Absolute: 0.2 10*3/uL (ref 0.1–1.0)
Monocytes Relative: 4 %
Neutro Abs: 3.4 10*3/uL (ref 1.7–7.7)
Neutrophils Relative %: 58 %
Platelets: 169 10*3/uL (ref 150–400)
RBC: 2.04 MIL/uL — ABNORMAL LOW (ref 4.22–5.81)
RDW: 16.9 % — ABNORMAL HIGH (ref 11.5–15.5)
WBC: 5.8 10*3/uL (ref 4.0–10.5)
nRBC: 0 /100 WBC
nRBC: 0.5 % — ABNORMAL HIGH (ref 0.0–0.2)

## 2020-08-09 LAB — PROTIME-INR
INR: 1.1 (ref 0.8–1.2)
Prothrombin Time: 14 seconds (ref 11.4–15.2)

## 2020-08-09 LAB — BASIC METABOLIC PANEL
Anion gap: 17 — ABNORMAL HIGH (ref 5–15)
BUN: 79 mg/dL — ABNORMAL HIGH (ref 6–20)
CO2: 21 mmol/L — ABNORMAL LOW (ref 22–32)
Calcium: 8.2 mg/dL — ABNORMAL LOW (ref 8.9–10.3)
Chloride: 100 mmol/L (ref 98–111)
Creatinine, Ser: 10.51 mg/dL — ABNORMAL HIGH (ref 0.61–1.24)
GFR, Estimated: 5 mL/min — ABNORMAL LOW (ref >60)
Glucose, Bld: 104 mg/dL — ABNORMAL HIGH (ref 70–99)
Potassium: 4.7 mmol/L (ref 3.5–5.1)
Sodium: 138 mmol/L (ref 135–145)

## 2020-08-09 MED ORDER — DIPHENHYDRAMINE HCL 25 MG PO CAPS
25.0000 mg | ORAL_CAPSULE | Freq: Once | ORAL | Status: AC
  Administered 2020-08-09: 25 mg via ORAL
  Filled 2020-08-09: qty 1

## 2020-08-09 NOTE — ED Triage Notes (Signed)
Patient seen earlier but eloped, reports L arm pain in his AV fistula.

## 2020-08-09 NOTE — ED Provider Notes (Addendum)
[place]    CSN: [id number] Arrival date & time: 08/09/20  1033      History   Chief Complaint Chief Complaint  Patient presents with  . hemodialysis graft issue    HPI Daniel [name] is a 60 y.o. male.   History of Present Illness  Daniel [name] is a 60 y.o. male that complains of pain in the left upper arm pain after being stuck with a needle in an attempt to access his fistula for dialysis earlier today. Patient describes the pain as sharp/stabbing. Pain severity now is 10 /10. The pain does not radiate. Pain is aggravated by movement, use and palpation. Pain is alleviated by nothing. He denies any numbness, tingling, weakness, loss of sensation or loss of motion of the extremity. The patient did not undergo dialysis because of this.  He states that he was told that he could resume his usual dialysis session on Saturday.  Care prior to arrival consisted of nothing, with no relief.        Past Medical History:  Diagnosis Date  . Anemia   . Diabetes mellitus without complication ([place])    patient denies  . Dialysis patient ([place])   . End stage chronic kidney disease ([place])   . Hypertension   . ICH (intracerebral hemorrhage) ([place]) 05/20/2017  . Shoulder pain, left 06/28/2013    Patient Active Problem List   Diagnosis Date Noted  . ESRD (end stage renal disease) ([place])   . AVM (arteriovenous malformation) of small bowel, acquired with hemorrhage   . Symptomatic anemia 07/31/2019  . Acute on chronic anemia 07/23/2019  . Chronic kidney disease, stage V ([place])   . Chronic hepatitis C without hepatic coma ([place]) 07/11/2019  . Iron deficiency anemia 03/30/2019  . CKD (chronic kidney disease), stage V ([place]) 03/30/2019  . Alcohol use disorder 03/30/2019  . B12 deficiency 03/30/2019  . Orthostatic hypotension 01/22/2019  . Anemia   . Hypertension 06/29/2016  . DM2 (diabetes mellitus, type 2) ([place]) 06/29/2016    Past Surgical History:  Procedure  Laterality Date  . APPENDECTOMY    . AV FISTULA PLACEMENT Left 08/03/2019   Procedure: LEFT ARM ARTERIOVENOUS (AV) CEPHALIC  FISTULA CREATION;  Surgeon: Waynetta Sandy, MD;  Location: [place];  Service: Vascular;  Laterality: Left;  . BIOPSY  06/30/2019   Procedure: BIOPSY;  Surgeon: Ronald Lobo, MD;  Location: [place];  Service: Endoscopy;;  . BIOPSY  08/02/2019   Procedure: BIOPSY;  Surgeon: Yetta Flock, MD;  Location: [place];  Service: Gastroenterology;;  . COLONOSCOPY  01/23/2012   Procedure: COLONOSCOPY;  Surgeon: Danie Binder, MD;  [facility];  Service: Endoscopy;  Laterality: N/A;  11:10 AM  . COLONOSCOPY WITH PROPOFOL N/A 06/30/2019   Procedure: COLONOSCOPY WITH PROPOFOL;  Surgeon: Ronald Lobo, MD;  Location: [place];  Service: Endoscopy;  Laterality: N/A;  . ENTEROSCOPY N/A 08/02/2019   Procedure: ENTEROSCOPY;  Surgeon: Yetta Flock, MD;  Location: [facility];  Service: Gastroenterology;  Laterality: N/A;  . ESOPHAGOGASTRODUODENOSCOPY (EGD) WITH PROPOFOL N/A 06/30/2019   Procedure: ESOPHAGOGASTRODUODENOSCOPY (EGD) WITH PROPOFOL;  Surgeon: Ronald Lobo, MD;  Location: [place];  Service: Endoscopy;  Laterality: N/A;  . FISTULA SUPERFICIALIZATION Left 10/17/2019   Procedure: LEFT UPPER EXTREMITY FISTULA REVISION, SIDE BRANCH LIGATION,  AND SUPERFICIALIZATION;  Surgeon: Marty Heck, MD;  Location: [place];  Service: Vascular;  Laterality: Left;  . GIVENS CAPSULE STUDY N/A 06/30/2019   Procedure: GIVENS CAPSULE  STUDY;  Surgeon: Ronald Lobo, MD;  Location: [place];  Service: Endoscopy;  Laterality: N/A;  . HEMOSTASIS CONTROL  08/02/2019   Procedure: HEMOSTASIS CONTROL;  Surgeon: Yetta Flock, MD;  Location: [place] [facility]OSCOPY;  Service: Gastroenterology;;  . INCISION AND DRAINAGE ABSCESS N/A 06/29/2016   Procedure: INCISION AND DRAINAGE ABDOMINAL WALL ABSCESS;  Surgeon: Alphonsa Overall, MD;  [facility];  Service:  General;  Laterality: N/A;  . INSERTION OF DIALYSIS CATHETER Right 08/03/2019   Procedure: INSERTION OF DIALYSIS CATHETER;  Surgeon: Waynetta Sandy, MD;  Location: [place];  Service: Vascular;  Laterality: Right;  . INSERTION OF DIALYSIS CATHETER Right 10/22/2019   Procedure: INSERTION OF 23CM TUNNELED DIALYSIS CATHETER RIGHT INTERNAL JUGULAR;  Surgeon: Angelia Mould, MD;  Location: [place];  Service: Vascular;  Laterality: Right;  . Left heel surgery    . PENILE BIOPSY N/A 03/26/2020   Procedure: PENILE ULCER DEBRIDEMENT;  Surgeon: Remi Haggard, MD;  [facility];  Service: Urology;  Laterality: N/A;  30 MINS       Home Medications    Prior to Admission medications   Medication Sig Start Date End Date Taking? Authorizing Provider  amLODipine (NORVASC) 10 MG tablet Take 10 mg by mouth daily.   Yes [provider]  oxyCODONE-acetaminophen (PERCOCET/ROXICET) 5-325 MG tablet Take 1 tablet by mouth every 6 (six) hours as needed for severe pain. 0/3/70  Yes Delora Fuel, MD  blood glucose meter kit and supplies KIT Dispense based on patient and insurance preference. Use up to four times daily as directed. (FOR ICD-9 250.00, 250.01). 07/01/16   Nita Sells, MD  Calcium Acetate 667 MG TABS Take 667-1,334 mg by mouth See admin instructions. Take 667 mg in the morning and afternoon and 1,334 in the evening 03/20/20   [provider]  cetirizine (ZYRTEC ALLERGY) 10 MG tablet Take 1 tablet (10 mg total) by mouth daily. Patient taking differently: Take 10 mg by mouth daily as needed for allergies. 02/23/20   [name], NP  cloNIDine (CATAPRES) 0.2 MG tablet Take  1 tablet twice daily . If makes drowsy take 2 tablets - after he is off of work Patient taking differently: Take 0.2 mg by mouth See admin instructions. If makes drowsy take 2 tablets - after he is off of work 07/13/19   [name], NP  gabapentin (NEURONTIN) 300 MG capsule Take 1  capsule (300 mg total) by mouth 3 (three) times daily. Take 365m twice daily and 6074mat bedtime. Patient taking differently: Take 300-600 mg by mouth See admin instructions. Take 30010mwice daily and 600m5m bedtime. 02/23/20   EdwaKerin Perna  hydrOXYzine (ATARAX/VISTARIL) 25 MG tablet Take 1 tablet (25 mg total) by mouth 2 (two) times daily as needed. Patient taking differently: Take 25 mg by mouth 2 (two) times daily as needed for anxiety. 02/23/20   EdwaKerin Perna  lidocaine-prilocaine (EMLA) cream Apply 1 application topically daily as needed (pain). 01/24/20   [provider]  colchicine 0.6 MG tablet Take 0.5 tablets (0.3 mg total) by mouth 2 (two) times daily. 07/24/20 07/29/20  MillNoemi Chapel  ferrous sulfate 325 (65 FE) MG tablet SMARTSIG:1 Tablet(s) By Mouth Twice Daily 08/11/19 02/09/20  [provider]  furosemide (LASIX) 40 MG tablet Take 1 tablet (40 mg total) by mouth daily. 07/25/19 02/09/20  JoneLadona Horns    Family History Family History  Problem Relation Age of Onset  .  Colon cancer Neg Hx     Social History Social History   Tobacco Use  . Smoking status: Current Every Day Smoker    Packs/day: 1.00    Years: 30.00    Pack years: 30.00    Types: Cigarettes  . Smokeless tobacco: Never Used  Vaping Use  . Vaping Use: Never used  Substance Use Topics  . Alcohol use: Not Currently    Alcohol/week: 12.0 standard drinks    Types: 12 Cans of beer per week  . Drug use: Not Currently    Types: "Crack" cocaine    Comment: last in 2020     Allergies   Dilaudid [hydromorphone hcl]   Review of Systems Review of Systems  Musculoskeletal: Positive for arthralgias.  All other systems reviewed and are negative.    Physical Exam Triage Vital Signs ED Triage Vitals  Enc Vitals Group     BP 08/09/20 1144 (!) 176/96     Pulse Rate 08/09/20 1144 91     Resp 08/09/20 1144 (!) 26     Temp 08/09/20 1144 97.8 F (36.6 C)      Temp Source 08/09/20 1144 Oral     SpO2 08/09/20 1144 100 %     Weight --      Height --      Head Circumference --      Peak Flow --      Pain Score 08/09/20 1138 10     Pain Loc --      Pain Edu? --      Excl. in [place]? --    No data found.  Updated Vital Signs BP (!) 176/96 (BP Location: Right Arm)   Pulse 91   Temp 97.8 F (36.6 C) (Oral)   Resp (!) 26   SpO2 100%   Visual Acuity Right Eye Distance:   Left Eye Distance:   Bilateral Distance:    Right Eye Near:   Left Eye Near:    Bilateral Near:     Physical Exam Vitals reviewed.  Constitutional:      General: He is not in acute distress.    Appearance: Normal appearance. He is not ill-appearing or toxic-appearing.  HENT:     Head: Normocephalic.  Cardiovascular:     Rate and Rhythm: Normal rate.  Musculoskeletal:        General: Normal range of motion.     Left upper arm: Swelling and tenderness present. No deformity.     Cervical back: Normal range of motion and neck supple.     Comments: Patient is exquisitely tender in the left upper arm.  Significant swelling noted to the area just above the dialysis access. Dialysis access of the left upper arm noted. Patient will not allow me to fully exam the area due to pain.  Skin:    General: Skin is warm and dry.  Neurological:     General: No focal deficit present.     Mental Status: He is alert and oriented to person, place, and time.      UC Treatments / Results  Labs (all labs ordered are listed, but only abnormal results are displayed) Labs Reviewed - No data to display  EKG   Radiology No results found.  Procedures Procedures (including critical care time)  Medications Ordered in UC Medications - No data to display  Initial Impression / Assessment and Plan / UC Course  I have reviewed the triage vital signs and the nursing notes.  Pertinent labs &  imaging results that were available during my care of the patient were reviewed by me and  considered in my medical decision making (see chart for details).    60 year old male with end-stage renal disease on HD, Tuesday, Thursday and Saturday presents with pain in the left upper arm after being stuck with a needle in an attempt to access his fistula for dialysis earlier today.  Pain is severe.  He did not have his dialysis performed today.  Significant swelling noted to the left upper arm.  Patient will not allow me to fully examine his arm. The patient is afebrile.  He is very uncomfortable but nontoxic-appearing. Discussed concerns with patient and need for immediate ED evaluation. Patient verbalized understanding and agreeable.  Patient is able to transport himself to the ED. Patient has a clear mental status at this time and has clear judgment to make decisions regarding their care.   This care was provided during an unprecedented National Emergency due to the Novel Coronavirus (COVID-19) pandemic. COVID-19 infections and transmission risks place heavy strains on healthcare resources.  As this pandemic evolves, our facility, providers, and staff strive to respond fluidly, to remain operational, and to provide care relative to available resources and information. Outcomes are unpredictable and treatments are without well-defined guidelines. Further, the impact of COVID-19 on all aspects of urgent care, including the impact to patients seeking care for reasons other than COVID-19, is unavoidable during this national emergency. At this time of the global pandemic, management of patients has significantly changed, even for non-COVID positive patients given high local and regional COVID volumes at this time requiring high healthcare system and resource utilization. The standard of care for management of both COVID suspected and non-COVID suspected patients continues to change rapidly at the local, regional, national, and global levels. This patient was worked up and treated to the best available but  ever changing evidence and resources available at this current time.   Documentation was completed with the aid of voice recognition software. Transcription may contain typographical errors. Final Clinical Impressions(s) / UC Diagnoses   Final diagnoses:  Left upper arm pain  Swelling of joint of upper arm, left     Discharge Instructions     Go to the emergency room right now for further evaluation of your left arm pain and swelling.    ED Prescriptions    None     PDMP not reviewed this encounter.   Enrique Sack, [place] 08/09/20 [place], [place], [place] 08/09/20 1216

## 2020-08-09 NOTE — H&P (View-Only) (Signed)
[facility] [place]  ASSESSMENT / PLAN: 60 y.o. male with ESRD dialyzing Tuesday, Thursday, Saturday via left upper extremity brachiocephalic arteriovenous fistula.  The fistula was traumatically accessed today causing hematoma.  I do not think the fistula will be usable in the near term, but may be salvageable.  We will plan to place a tunneled dialysis catheter tomorrow in OR. This can be done as an outpatient from my standpoint. NPO at midnight.   CHIEF COMPLAINT: traumatic AVF access  HISTORY OF PRESENT ILLNESS: Daniel [name] is a 60 y.o. male with end-stage renal disease dialyzing Tuesday, Thursday, Saturday via left upper extremity brachiocephalic AV fistula 11/29/4194 by Dr. Donzetta Matters.  Fistula was then superficialized 10/17/2019 by Dr. Carlis Abbott.  Patient reports he was at his typical dialysis session today.  Cannulation was attempted.  He had pain in the arm afterwards.  He noted significant swelling and tenderness in the left arm.  He also complains of painful leg swelling and a nodular rash across his extremities.  Past Medical History:  Diagnosis Date  . Anemia   . Diabetes mellitus without complication ([place])    patient denies  . Dialysis patient ([place])   . End stage chronic kidney disease ([place])   . Hypertension   . ICH (intracerebral hemorrhage) ([place]) 05/20/2017  . Shoulder pain, left 06/28/2013    Past Surgical History:  Procedure Laterality Date  . APPENDECTOMY    . AV FISTULA PLACEMENT Left 08/03/2019   Procedure: LEFT ARM ARTERIOVENOUS (AV) CEPHALIC  FISTULA CREATION;  Surgeon: Waynetta Sandy, MD;  Location: [place];  Service: Vascular;  Laterality: Left;  . BIOPSY  06/30/2019   Procedure: BIOPSY;  Surgeon: Ronald Lobo, MD;  Location: [place];  Service: Endoscopy;;  . BIOPSY  08/02/2019   Procedure: BIOPSY;  Surgeon: Yetta Flock, MD;  Location: [place];  Service: Gastroenterology;;  . COLONOSCOPY  01/23/2012   Procedure:  COLONOSCOPY;  Surgeon: Danie Binder, MD;  [facility];  Service: Endoscopy;  Laterality: N/A;  11:10 AM  . COLONOSCOPY WITH PROPOFOL N/A 06/30/2019   Procedure: COLONOSCOPY WITH PROPOFOL;  Surgeon: Ronald Lobo, MD;  Location: [place];  Service: Endoscopy;  Laterality: N/A;  . ENTEROSCOPY N/A 08/02/2019   Procedure: ENTEROSCOPY;  Surgeon: Yetta Flock, MD;  Location: [facility]COPY;  Service: Gastroenterology;  Laterality: N/A;  . ESOPHAGOGASTRODUODENOSCOPY (EGD) WITH PROPOFOL N/A 06/30/2019   Procedure: ESOPHAGOGASTRODUODENOSCOPY (EGD) WITH PROPOFOL;  Surgeon: Ronald Lobo, MD;  Location: [place];  Service: Endoscopy;  Laterality: N/A;  . FISTULA SUPERFICIALIZATION Left 10/17/2019   Procedure: LEFT UPPER EXTREMITY FISTULA REVISION, SIDE BRANCH LIGATION,  AND SUPERFICIALIZATION;  Surgeon: Marty Heck, MD;  Location: [place];  Service: Vascular;  Laterality: Left;  . GIVENS CAPSULE STUDY N/A 06/30/2019   Procedure: GIVENS CAPSULE STUDY;  Surgeon: Ronald Lobo, MD;  Location: [facility]COPY;  Service: Endoscopy;  Laterality: N/A;  . HEMOSTASIS CONTROL  08/02/2019   Procedure: HEMOSTASIS CONTROL;  Surgeon: Yetta Flock, MD;  Location: [place] [facility];  Service: Gastroenterology;;  . INCISION AND DRAINAGE ABSCESS N/A 06/29/2016   Procedure: INCISION AND DRAINAGE ABDOMINAL WALL ABSCESS;  Surgeon: Alphonsa Overall, MD;  [facility];  Service: General;  Laterality: N/A;  . INSERTION OF DIALYSIS CATHETER Right 08/03/2019   Procedure: INSERTION OF DIALYSIS CATHETER;  Surgeon: Waynetta Sandy, MD;  Location: [place];  Service: Vascular;  Laterality: Right;  . INSERTION OF DIALYSIS CATHETER Right 10/22/2019   Procedure: INSERTION OF  23CM TUNNELED DIALYSIS CATHETER RIGHT INTERNAL JUGULAR;  Surgeon: Angelia Mould, MD;  Location: [place];  Service: Vascular;  Laterality: Right;  . Left heel surgery    . PENILE BIOPSY N/A 03/26/2020   Procedure: PENILE ULCER  DEBRIDEMENT;  Surgeon: Remi Haggard, MD;  [facility];  Service: Urology;  Laterality: N/A;  30 MINS    Family History  Problem Relation Age of Onset  . Colon cancer Neg Hx     Social History   Socioeconomic History  . Marital status: Single    Spouse name: Not on file  . Number of children: Not on file  . Years of education: Not on file  . Highest education level: Not on file  Occupational History  . Not on file  Tobacco Use  . Smoking status: Current Every Day Smoker    Packs/day: 1.00    Years: 30.00    Pack years: 30.00    Types: Cigarettes  . Smokeless tobacco: Never Used  Vaping Use  . Vaping Use: Never used  Substance and Sexual Activity  . Alcohol use: Not Currently    Alcohol/week: 12.0 standard drinks    Types: 12 Cans of beer per week  . Drug use: Not Currently    Types: "Crack" cocaine    Comment: last in 2020  . Sexual activity: Yes    Birth control/protection: None  Other Topics Concern  . Not on file  Social History Narrative  . Not on file   Social Determinants of Health   Financial Resource Strain: Not on file  Food Insecurity: Not on file  Transportation Needs: Not on file  Physical Activity: Not on file  Stress: Not on file  Social Connections: Not on file  Intimate Partner Violence: Not on file    Allergies  Allergen Reactions  . Dilaudid [Hydromorphone Hcl] Itching    Pt reports itchiness after IM injection     No current facility-administered medications for this encounter.   Current Outpatient Medications  Medication Sig Dispense Refill  . amLODipine (NORVASC) 10 MG tablet Take 10 mg by mouth daily.    . blood glucose meter kit and supplies KIT Dispense based on patient and insurance preference. Use up to four times daily as directed. (FOR ICD-9 250.00, 250.01). 1 each 0  . Calcium Acetate 667 MG TABS Take 667-1,334 mg by mouth See admin instructions. Take 667 mg in the morning and afternoon and 1,334 in the evening     . cetirizine (ZYRTEC ALLERGY) 10 MG tablet Take 1 tablet (10 mg total) by mouth daily. (Patient taking differently: Take 10 mg by mouth daily as needed for allergies.) 90 tablet 1  . cloNIDine (CATAPRES) 0.2 MG tablet Take  1 tablet twice daily . If makes drowsy take 2 tablets - after he is off of work (Patient taking differently: Take 0.2 mg by mouth See admin instructions. If makes drowsy take 2 tablets - after he is off of work) 180 tablet 1  . gabapentin (NEURONTIN) 300 MG capsule Take 1 capsule (300 mg total) by mouth 3 (three) times daily. Take 351m twice daily and 6035mat bedtime. (Patient taking differently: Take 300-600 mg by mouth See admin instructions. Take 30054mwice daily and 600m26m bedtime.) 120 capsule 2  . hydrOXYzine (ATARAX/VISTARIL) 25 MG tablet Take 1 tablet (25 mg total) by mouth 2 (two) times daily as needed. (Patient taking differently: Take 25 mg by mouth 2 (two) times daily as needed for anxiety.)  120 tablet 1  . lidocaine-prilocaine (EMLA) cream Apply 1 application topically daily as needed (pain).    [name] oxyCODONE-acetaminophen (PERCOCET/ROXICET) 5-325 MG tablet Take 1 tablet by mouth every 6 (six) hours as needed for severe pain. 15 tablet 0    REVIEW OF SYSTEMS:  _0  denotes positive finding, _1  denotes negative finding Cardiac  Comments:  Chest pain or chest pressure:    Shortness of breath upon exertion:    Short of breath when lying flat:    Irregular heart rhythm:        Vascular    Pain in calf, thigh, or hip brought on by ambulation:    Pain in feet at night that wakes you up from your sleep:     Blood clot in your veins:    Leg swelling:  x       Pulmonary    Oxygen at home:    Productive cough:     Wheezing:         Neurologic    Sudden weakness in arms or legs:     Sudden numbness in arms or legs:     Sudden onset of difficulty speaking or slurred speech:    Temporary loss of vision in one eye:     Problems with dizziness:          Gastrointestinal    Blood in stool:     Vomited blood:         Genitourinary    Burning when urinating:     Blood in urine:        Psychiatric    Major depression:         Hematologic    Bleeding problems:    Problems with blood clotting too easily:        Skin    Rashes or ulcers:        Constitutional    Fever or chills:      PHYSICAL EXAM  Vitals:   08/09/20 1204 08/09/20 1315 08/09/20 1330 08/09/20 1345  BP: (!) 192/103 (!) 184/71 (!) 165/65 (!) 175/50  Pulse: 86 85  94  Resp: _2 Temp: 98.2 F (36.8 C)     TempSrc: Oral     SpO2: 100% 96%  99%    Constitutional: Chronically ill appearing.  No distress. Appears well nourished.  Neurologic: CN intact.  No focal findings.  No sensory loss. Psychiatric: Mood and affect symmetric and appropriate. Eyes: No icterus. No conjunctival pallor. Ears, nose, throat: mucous membranes moist. Midline trachea.  Cardiac: Regular rate and rhythm.  Respiratory: unlabored. Abdominal: soft, non-tender, non-distended.  Peripheral vascular:  2+left radial pulse  Left upper arm tender and edematous  Palpable thrill in LUE AVF Extremity: No cyanosis. No pallor.  Skin: Nodular rash across extremities. No gangrene. No ulceration.  Lymphatic: No Stemmer's sign. No palpable lymphadenopathy.  PERTINENT LABORATORY AND RADIOLOGIC DATA  Most recent CBC CBC Latest Ref Rng & Units 08/09/2020 08/04/2020 07/29/2020  WBC 4.0 - 10.5 K/uL 5.8 7.2 6.9  Hemoglobin 13.0 - 17.0 g/dL 7.1(L) 8.7(L) 9.8(L)  Hematocrit 39.0 - 52.0 % 22.8(L) 27.2(L) 30.8(L)  Platelets 150 - 400 K/uL 169 149(L) 122(L)     Most recent CMP CMP Latest Ref Rng & Units 08/04/2020 07/29/2020 07/24/2020  Glucose 70 - 99 mg/dL 112(H) 100(H) 163(H)  BUN 6 - 20 mg/dL 87(H) 32(H) 24(H)  Creatinine 0.61 - 1.24 mg/dL 11.50(H) 7.67(H) 5.99(H)  Sodium 135 - 145 mmol/L 132(L) 138  136  Potassium 3.5 - 5.1 mmol/L 5.0 4.0 3.2(L)  Chloride 98 - 111 mmol/L 90(L) 96(L) 94(L)  CO2  22 - 32 mmol/L _0 Calcium 8.9 - 10.3 mg/dL 8.4(L) 8.7(L) 8.6(L)  Total Protein 6.5 - 8.1 g/dL - 8.6(H) 8.7(H)  Total Bilirubin 0.3 - 1.2 mg/dL - 0.9 0.4  Alkaline Phos 38 - 126 U/L - 64 64  AST 15 - 41 U/L - 119(H) 74(H)  ALT 0 - 44 U/L - 39 28    Renal function Estimated Creatinine Clearance: 9 mL/min (A) (by C-G formula based on SCr of 11.5 mg/dL (H)).  Hgb A1c MFr Bld (%)  Date Value  03/26/2020 6.4 (H)    LDL Chol Calc (NIH)  Date Value Ref Range Status  12/28/2019 32 0 - 99 mg/dL Final     Daniel [name]. Stanford Breed, MD [facility] Phone Number: [phone] 08/09/2020 3:28 PM

## 2020-08-09 NOTE — ED Triage Notes (Addendum)
Patient was at dialysis. Patient reports "they stuck it in the wrong place".  Did not have dialysis.  Patient has a dressing to left upper arm.  Patient says he cannot move his arm, cannot pick it up from shoulder.  Left upper arm is larger than right, patient jumps when he thinks you are going to touch upper arm.  Left radial pulse is 2 +.  Right and left hand are both cool to touch.    Patient complains of bilateral foot pain, history of the same  Patient complains of bumps all over him that are itching

## 2020-08-09 NOTE — Progress Notes (Addendum)
[name] Is currently in the ED with swollen arm at fistula site. I spoke with patient's nurse, she is not sure if patient will be discharged this evening or not, "it will be up to ED Dr." Dr. Stanford Breed said as far as he is concerned [name] may be discharged."  I called [name] cell phone, there was no answer, I left a message with instructions for patient , if he is discharged tonight.  I left  A message on voice mail.  I instructed the patient to arrive at [facility] entrance at 0750, register in the [place]. DO NOT eat or drink anything after midnight.  I instructed the patient to take the following medications in the am with just enough water to get them down: Amlodipine, Zyrtec, Clonidine, Gabapentin; if needed: Hydroxyzine, and Percocet.  I asked patient to not wear any lotions, powders, cologne, jewelry, piercing, make-up or nail polish.  Wear clean clothes. Brush teeth.I instructed  patient to call [phone], in the am if there were any questions or problems.  [name] has type II diabetes, patient is not on mediation for diabetes.

## 2020-08-09 NOTE — ED Notes (Signed)
Patient presented to sam, np.  Patient was advised by her to go to ED now.  Rolled patient in wheelchair to car, reinforced the need to go to ED.  Patient left giving intent he was going to ED

## 2020-08-09 NOTE — Progress Notes (Signed)
FYI--   Just received call from his HD unit that he was stuck once and infiltrated his AVF this morning, but they were able to re-cannulate him and start HD which was running fine. He then insisted to sign off dialysis due to arm pain after 1 hour and has now presented to UC, then ED.   I have not evaluated this patient, just adding this note to his chart for awareness.

## 2020-08-09 NOTE — Consult Note (Signed)
[facility] [place]  ASSESSMENT / PLAN: 60 y.o. male with ESRD dialyzing Tuesday, Thursday, Saturday via left upper extremity brachiocephalic arteriovenous fistula.  The fistula was traumatically accessed today causing hematoma.  I do not think the fistula will be usable in the near term, but may be salvageable.  We will plan to place a tunneled dialysis catheter tomorrow in OR. This can be done as an outpatient from my standpoint. NPO at midnight.   CHIEF COMPLAINT: traumatic AVF access  HISTORY OF PRESENT ILLNESS: Daniel [name] is a 60 y.o. male with end-stage renal disease dialyzing Tuesday, Thursday, Saturday via left upper extremity brachiocephalic AV fistula 11/28/9935 by Dr. Donzetta Matters.  Fistula was then superficialized 10/17/2019 by Dr. Carlis Abbott.  Patient reports he was at his typical dialysis session today.  Cannulation was attempted.  He had pain in the arm afterwards.  He noted significant swelling and tenderness in the left arm.  He also complains of painful leg swelling and a nodular rash across his extremities.  Past Medical History:  Diagnosis Date  . Anemia   . Diabetes mellitus without complication ([place])    patient denies  . Dialysis patient ([place])   . End stage chronic kidney disease ([place])   . Hypertension   . ICH (intracerebral hemorrhage) ([place]) 05/20/2017  . Shoulder pain, left 06/28/2013    Past Surgical History:  Procedure Laterality Date  . APPENDECTOMY    . AV FISTULA PLACEMENT Left 08/03/2019   Procedure: LEFT ARM ARTERIOVENOUS (AV) CEPHALIC  FISTULA CREATION;  Surgeon: Waynetta Sandy, MD;  Location: [place];  Service: Vascular;  Laterality: Left;  . BIOPSY  06/30/2019   Procedure: BIOPSY;  Surgeon: Ronald Lobo, MD;  Location: [place];  Service: Endoscopy;;  . BIOPSY  08/02/2019   Procedure: BIOPSY;  Surgeon: Yetta Flock, MD;  Location: [place];  Service: Gastroenterology;;  . COLONOSCOPY  01/23/2012   Procedure:  COLONOSCOPY;  Surgeon: Danie Binder, MD;  [facility];  Service: Endoscopy;  Laterality: N/A;  11:10 AM  . COLONOSCOPY WITH PROPOFOL N/A 06/30/2019   Procedure: COLONOSCOPY WITH PROPOFOL;  Surgeon: Ronald Lobo, MD;  Location: [place];  Service: Endoscopy;  Laterality: N/A;  . ENTEROSCOPY N/A 08/02/2019   Procedure: ENTEROSCOPY;  Surgeon: Yetta Flock, MD;  Location: [facility] ENDOSCOPY;  Service: Gastroenterology;  Laterality: N/A;  . ESOPHAGOGASTRODUODENOSCOPY (EGD) WITH PROPOFOL N/A 06/30/2019   Procedure: ESOPHAGOGASTRODUODENOSCOPY (EGD) WITH PROPOFOL;  Surgeon: Ronald Lobo, MD;  Location: [place];  Service: Endoscopy;  Laterality: N/A;  . FISTULA SUPERFICIALIZATION Left 10/17/2019   Procedure: LEFT UPPER EXTREMITY FISTULA REVISION, SIDE BRANCH LIGATION,  AND SUPERFICIALIZATION;  Surgeon: Marty Heck, MD;  Location: [place];  Service: Vascular;  Laterality: Left;  . GIVENS CAPSULE STUDY N/A 06/30/2019   Procedure: GIVENS CAPSULE STUDY;  Surgeon: Ronald Lobo, MD;  Location: [facility] ENDOSCOPY;  Service: Endoscopy;  Laterality: N/A;  . HEMOSTASIS CONTROL  08/02/2019   Procedure: HEMOSTASIS CONTROL;  Surgeon: Yetta Flock, MD;  Location: [place] ENDOSCOPY;  Service: Gastroenterology;;  . INCISION AND DRAINAGE ABSCESS N/A 06/29/2016   Procedure: INCISION AND DRAINAGE ABDOMINAL WALL ABSCESS;  Surgeon: Alphonsa Overall, MD;  [facility];  Service: General;  Laterality: N/A;  . INSERTION OF DIALYSIS CATHETER Right 08/03/2019   Procedure: INSERTION OF DIALYSIS CATHETER;  Surgeon: Waynetta Sandy, MD;  Location: [place];  Service: Vascular;  Laterality: Right;  . INSERTION OF DIALYSIS CATHETER Right 10/22/2019   Procedure: INSERTION OF  23CM TUNNELED DIALYSIS CATHETER RIGHT INTERNAL JUGULAR;  Surgeon: Angelia Mould, MD;  Location: [place];  Service: Vascular;  Laterality: Right;  . Left heel surgery    . PENILE BIOPSY N/A 03/26/2020   Procedure: PENILE ULCER  DEBRIDEMENT;  Surgeon: Remi Haggard, MD;  [facility];  Service: Urology;  Laterality: N/A;  30 MINS    Family History  Problem Relation Age of Onset  . Colon cancer Neg Hx     Social History   Socioeconomic History  . Marital status: Single    Spouse name: Not on file  . Number of children: Not on file  . Years of education: Not on file  . Highest education level: Not on file  Occupational History  . Not on file  Tobacco Use  . Smoking status: Current Every Day Smoker    Packs/day: 1.00    Years: 30.00    Pack years: 30.00    Types: Cigarettes  . Smokeless tobacco: Never Used  Vaping Use  . Vaping Use: Never used  Substance and Sexual Activity  . Alcohol use: Not Currently    Alcohol/week: 12.0 standard drinks    Types: 12 Cans of beer per week  . Drug use: Not Currently    Types: "Crack" cocaine    Comment: last in 2020  . Sexual activity: Yes    Birth control/protection: None  Other Topics Concern  . Not on file  Social History Narrative  . Not on file   Social Determinants of Health   Financial Resource Strain: Not on file  Food Insecurity: Not on file  Transportation Needs: Not on file  Physical Activity: Not on file  Stress: Not on file  Social Connections: Not on file  Intimate Partner Violence: Not on file    Allergies  Allergen Reactions  . Dilaudid [Hydromorphone Hcl] Itching    Pt reports itchiness after IM injection     No current facility-administered medications for this encounter.   Current Outpatient Medications  Medication Sig Dispense Refill  . amLODipine (NORVASC) 10 MG tablet Take 10 mg by mouth daily.    . blood glucose meter kit and supplies KIT Dispense based on patient and insurance preference. Use up to four times daily as directed. (FOR ICD-9 250.00, 250.01). 1 each 0  . Calcium Acetate 667 MG TABS Take 667-1,334 mg by mouth See admin instructions. Take 667 mg in the morning and afternoon and 1,334 in the evening     . cetirizine (ZYRTEC ALLERGY) 10 MG tablet Take 1 tablet (10 mg total) by mouth daily. (Patient taking differently: Take 10 mg by mouth daily as needed for allergies.) 90 tablet 1  . cloNIDine (CATAPRES) 0.2 MG tablet Take  1 tablet twice daily . If makes drowsy take 2 tablets - after he is off of work (Patient taking differently: Take 0.2 mg by mouth See admin instructions. If makes drowsy take 2 tablets - after he is off of work) 180 tablet 1  . gabapentin (NEURONTIN) 300 MG capsule Take 1 capsule (300 mg total) by mouth 3 (three) times daily. Take 351m twice daily and 6035mat bedtime. (Patient taking differently: Take 300-600 mg by mouth See admin instructions. Take 30054mwice daily and 600m26m bedtime.) 120 capsule 2  . hydrOXYzine (ATARAX/VISTARIL) 25 MG tablet Take 1 tablet (25 mg total) by mouth 2 (two) times daily as needed. (Patient taking differently: Take 25 mg by mouth 2 (two) times daily as needed for anxiety.)  120 tablet 1  . lidocaine-prilocaine (EMLA) cream Apply 1 application topically daily as needed (pain).    [name] oxyCODONE-acetaminophen (PERCOCET/ROXICET) 5-325 MG tablet Take 1 tablet by mouth every 6 (six) hours as needed for severe pain. 15 tablet 0    REVIEW OF SYSTEMS:  _0  denotes positive finding, _1  denotes negative finding Cardiac  Comments:  Chest pain or chest pressure:    Shortness of breath upon exertion:    Short of breath when lying flat:    Irregular heart rhythm:        Vascular    Pain in calf, thigh, or hip brought on by ambulation:    Pain in feet at night that wakes you up from your sleep:     Blood clot in your veins:    Leg swelling:  x       Pulmonary    Oxygen at home:    Productive cough:     Wheezing:         Neurologic    Sudden weakness in arms or legs:     Sudden numbness in arms or legs:     Sudden onset of difficulty speaking or slurred speech:    Temporary loss of vision in one eye:     Problems with dizziness:          Gastrointestinal    Blood in stool:     Vomited blood:         Genitourinary    Burning when urinating:     Blood in urine:        Psychiatric    Major depression:         Hematologic    Bleeding problems:    Problems with blood clotting too easily:        Skin    Rashes or ulcers:        Constitutional    Fever or chills:      PHYSICAL EXAM  Vitals:   08/09/20 1204 08/09/20 1315 08/09/20 1330 08/09/20 1345  BP: (!) 192/103 (!) 184/71 (!) 165/65 (!) 175/50  Pulse: 86 85  94  Resp: _2 Temp: 98.2 F (36.8 C)     TempSrc: Oral     SpO2: 100% 96%  99%    Constitutional: Chronically ill appearing.  No distress. Appears well nourished.  Neurologic: CN intact.  No focal findings.  No sensory loss. Psychiatric: Mood and affect symmetric and appropriate. Eyes: No icterus. No conjunctival pallor. Ears, nose, throat: mucous membranes moist. Midline trachea.  Cardiac: Regular rate and rhythm.  Respiratory: unlabored. Abdominal: soft, non-tender, non-distended.  Peripheral vascular:  2+left radial pulse  Left upper arm tender and edematous  Palpable thrill in LUE AVF Extremity: No cyanosis. No pallor.  Skin: Nodular rash across extremities. No gangrene. No ulceration.  Lymphatic: No Stemmer's sign. No palpable lymphadenopathy.  PERTINENT LABORATORY AND RADIOLOGIC DATA  Most recent CBC CBC Latest Ref Rng & Units 08/09/2020 08/04/2020 07/29/2020  WBC 4.0 - 10.5 K/uL 5.8 7.2 6.9  Hemoglobin 13.0 - 17.0 g/dL 7.1(L) 8.7(L) 9.8(L)  Hematocrit 39.0 - 52.0 % 22.8(L) 27.2(L) 30.8(L)  Platelets 150 - 400 K/uL 169 149(L) 122(L)     Most recent CMP CMP Latest Ref Rng & Units 08/04/2020 07/29/2020 07/24/2020  Glucose 70 - 99 mg/dL 112(H) 100(H) 163(H)  BUN 6 - 20 mg/dL 87(H) 32(H) 24(H)  Creatinine 0.61 - 1.24 mg/dL 11.50(H) 7.67(H) 5.99(H)  Sodium 135 - 145 mmol/L 132(L) 138  136  Potassium 3.5 - 5.1 mmol/L 5.0 4.0 3.2(L)  Chloride 98 - 111 mmol/L 90(L) 96(L) 94(L)  CO2  22 - 32 mmol/L _0 Calcium 8.9 - 10.3 mg/dL 8.4(L) 8.7(L) 8.6(L)  Total Protein 6.5 - 8.1 g/dL - 8.6(H) 8.7(H)  Total Bilirubin 0.3 - 1.2 mg/dL - 0.9 0.4  Alkaline Phos 38 - 126 U/L - 64 64  AST 15 - 41 U/L - 119(H) 74(H)  ALT 0 - 44 U/L - 39 28    Renal function Estimated Creatinine Clearance: 9 mL/min (A) (by C-G formula based on SCr of 11.5 mg/dL (H)).  Hgb A1c MFr Bld (%)  Date Value  03/26/2020 6.4 (H)    LDL Chol Calc (NIH)  Date Value Ref Range Status  12/28/2019 32 0 - 99 mg/dL Final     Yevonne Aline. Stanford Breed, MD [facility] Phone Number: [phone] 08/09/2020 3:28 PM

## 2020-08-09 NOTE — ED Triage Notes (Signed)
Emergency Medicine Provider Triage Evaluation Note  Daniel [name] , a 60 y.o. male  was evaluated in triage.  Pt complains of coming from dialysis, staff couldn't acces port, rash   Review of Systems  Positive:  Rash, pain in left arm near port Negative: Fevers, chills, throat swelling, lip swelling   Physical Exam  BP (!) 192/103 (BP Location: Right Arm)   Pulse 86   Temp 98.2 F (36.8 C) (Oral)   Resp 18   SpO2 100%  Gen:   Awake, no distress   HEENT:  Atraumatic  Resp:  Normal effort  Cardiac:  Normal rate, left arm radial pulse intact, no palpable thrill Abd:   Nondistended, nontender  MSK:   Swollen left upper arm around fistula, tender  Neuro:  Speech clear   Medical Decision Making  Medically screening exam initiated at 12:17 PM.  Appropriate orders placed.  Daniel [name] was informed that the remainder of the evaluation will be completed by another provider, this initial triage assessment does not replace that evaluation, and the importance of remaining in the ED until their evaluation is complete.  Clinical Impression  60 year old male sent from dialysis with difficulty accessing port.  Now with pain in his left arm that is sharp and stabbing.  Radial pulse intact, though difficult to assess thrill.  Also with diffuse longstanding rash that is itching.  Will give Benadryl.   Garald Balding, PA-C 08/09/20 1222

## 2020-08-09 NOTE — ED Provider Notes (Signed)
[place] EMERGENCY DEPARTMENT Provider Note   CSN: [id number] Arrival date & time: 08/09/20  1200     History Chief Complaint  Patient presents with  . Rash    Daniel [name] is a 60 y.o. male.  HPI   60 year old male who is end-stage renal disease every TTS presents to the emergency department concern for left upper extremity swelling.  Patient states today at dialysis they tried to access his fistula, they were unsuccessful, did not perform dialysis.  Following this patient experienced sudden swelling of the left upper extremity.  He is now complaining of pain extending from the shoulder down to the hand with difficulty closing the hand due to swelling.  No significant bleeding from the AV fistula site.  There is also mention of a rash that the patient has been experiencing for the past 2 to 3 months, he is currently being treated as an outpatient for that.  Denies any chest pain or shortness of breath.  No recent fever or illness.  Past Medical History:  Diagnosis Date  . Anemia   . Diabetes mellitus without complication ([place])    patient denies  . Dialysis patient ([place])   . End stage chronic kidney disease ([place])   . Hypertension   . ICH (intracerebral hemorrhage) ([place]) 05/20/2017  . Shoulder pain, left 06/28/2013    Patient Active Problem List   Diagnosis Date Noted  . ESRD (end stage renal disease) ([place])   . AVM (arteriovenous malformation) of small bowel, acquired with hemorrhage   . Symptomatic anemia 07/31/2019  . Acute on chronic anemia 07/23/2019  . Chronic kidney disease, stage V ([place])   . Chronic hepatitis C without hepatic coma ([place]) 07/11/2019  . Iron deficiency anemia 03/30/2019  . CKD (chronic kidney disease), stage V ([place]) 03/30/2019  . Alcohol use disorder 03/30/2019  . B12 deficiency 03/30/2019  . Orthostatic hypotension 01/22/2019  . Anemia   . Hypertension 06/29/2016  . DM2 (diabetes mellitus, type 2) ([place]) 06/29/2016    Past  Surgical History:  Procedure Laterality Date  . APPENDECTOMY    . AV FISTULA PLACEMENT Left 08/03/2019   Procedure: LEFT ARM ARTERIOVENOUS (AV) CEPHALIC  FISTULA CREATION;  Surgeon: Waynetta Sandy, MD;  Location: [place];  Service: Vascular;  Laterality: Left;  . BIOPSY  06/30/2019   Procedure: BIOPSY;  Surgeon: Ronald Lobo, MD;  Location: [place];  Service: Endoscopy;;  . BIOPSY  08/02/2019   Procedure: BIOPSY;  Surgeon: Yetta Flock, MD;  Location: [place];  Service: Gastroenterology;;  . COLONOSCOPY  01/23/2012   Procedure: COLONOSCOPY;  Surgeon: Danie Binder, MD;  [facility];  Service: Endoscopy;  Laterality: N/A;  11:10 AM  . COLONOSCOPY WITH PROPOFOL N/A 06/30/2019   Procedure: COLONOSCOPY WITH PROPOFOL;  Surgeon: Ronald Lobo, MD;  Location: [place];  Service: Endoscopy;  Laterality: N/A;  . ENTEROSCOPY N/A 08/02/2019   Procedure: ENTEROSCOPY;  Surgeon: Yetta Flock, MD;  Location: [facility] ENDOSCOPY;  Service: Gastroenterology;  Laterality: N/A;  . ESOPHAGOGASTRODUODENOSCOPY (EGD) WITH PROPOFOL N/A 06/30/2019   Procedure: ESOPHAGOGASTRODUODENOSCOPY (EGD) WITH PROPOFOL;  Surgeon: Ronald Lobo, MD;  Location: [place];  Service: Endoscopy;  Laterality: N/A;  . FISTULA SUPERFICIALIZATION Left 10/17/2019   Procedure: LEFT UPPER EXTREMITY FISTULA REVISION, SIDE BRANCH LIGATION,  AND SUPERFICIALIZATION;  Surgeon: Marty Heck, MD;  Location: [place];  Service: Vascular;  Laterality: Left;  . GIVENS CAPSULE STUDY N/A 06/30/2019   Procedure: GIVENS CAPSULE STUDY;  Surgeon: Ronald Lobo, MD;  Location: [facility] ENDOSCOPY;  Service: Endoscopy;  Laterality: N/A;  . HEMOSTASIS CONTROL  08/02/2019   Procedure: HEMOSTASIS CONTROL;  Surgeon: Yetta Flock, MD;  Location: [place] [facility]OSCOPY;  Service: Gastroenterology;;  . INCISION AND DRAINAGE ABSCESS N/A 06/29/2016   Procedure: INCISION AND DRAINAGE ABDOMINAL WALL ABSCESS;  Surgeon: Alphonsa Overall, MD;   [facility];  Service: General;  Laterality: N/A;  . INSERTION OF DIALYSIS CATHETER Right 08/03/2019   Procedure: INSERTION OF DIALYSIS CATHETER;  Surgeon: Waynetta Sandy, MD;  Location: [place];  Service: Vascular;  Laterality: Right;  . INSERTION OF DIALYSIS CATHETER Right 10/22/2019   Procedure: INSERTION OF 23CM TUNNELED DIALYSIS CATHETER RIGHT INTERNAL JUGULAR;  Surgeon: Angelia Mould, MD;  Location: [place];  Service: Vascular;  Laterality: Right;  . Left heel surgery    . PENILE BIOPSY N/A 03/26/2020   Procedure: PENILE ULCER DEBRIDEMENT;  Surgeon: Remi Haggard, MD;  [facility];  Service: Urology;  Laterality: N/A;  30 MINS       Family History  Problem Relation Age of Onset  . Colon cancer Neg Hx     Social History   Tobacco Use  . Smoking status: Current Every Day Smoker    Packs/day: 1.00    Years: 30.00    Pack years: 30.00    Types: Cigarettes  . Smokeless tobacco: Never Used  Vaping Use  . Vaping Use: Never used  Substance Use Topics  . Alcohol use: Not Currently    Alcohol/week: 12.0 standard drinks    Types: 12 Cans of beer per week  . Drug use: Not Currently    Types: "Crack" cocaine    Comment: last in 2020    Home Medications Prior to Admission medications   Medication Sig Start Date End Date Taking? Authorizing Provider  amLODipine (NORVASC) 10 MG tablet Take 10 mg by mouth daily.    [provider]  blood glucose meter kit and supplies KIT Dispense based on patient and insurance preference. Use up to four times daily as directed. (FOR ICD-9 250.00, 250.01). 07/01/16   Nita Sells, MD  Calcium Acetate 667 MG TABS Take 667-1,334 mg by mouth See admin instructions. Take 667 mg in the morning and afternoon and 1,334 in the evening 03/20/20   [provider]  cetirizine (ZYRTEC ALLERGY) 10 MG tablet Take 1 tablet (10 mg total) by mouth daily. Patient taking differently: Take 10 mg by mouth daily as needed  for allergies. 02/23/20   [name], NP  cloNIDine (CATAPRES) 0.2 MG tablet Take  1 tablet twice daily . If makes drowsy take 2 tablets - after he is off of work Patient taking differently: Take 0.2 mg by mouth See admin instructions. If makes drowsy take 2 tablets - after he is off of work 07/13/19   [name], NP  gabapentin (NEURONTIN) 300 MG capsule Take 1 capsule (300 mg total) by mouth 3 (three) times daily. Take 333m twice daily and 6071mat bedtime. Patient taking differently: Take 300-600 mg by mouth See admin instructions. Take 30069mwice daily and 600m28m bedtime. 02/23/20   EdwaKerin Perna  hydrOXYzine (ATARAX/VISTARIL) 25 MG tablet Take 1 tablet (25 mg total) by mouth 2 (two) times daily as needed. Patient taking differently: Take 25 mg by mouth 2 (two) times daily as needed for anxiety. 02/23/20   EdwaKerin Perna  lidocaine-prilocaine (EMLA) cream Apply 1 application topically daily as needed (  pain). 01/24/20   [provider]  oxyCODONE-acetaminophen (PERCOCET/ROXICET) 5-325 MG tablet Take 1 tablet by mouth every 6 (six) hours as needed for severe pain. 0/9/47   Delora Fuel, MD  colchicine 0.6 MG tablet Take 0.5 tablets (0.3 mg total) by mouth 2 (two) times daily. 07/24/20 07/29/20  Noemi Chapel, MD  ferrous sulfate 325 (65 FE) MG tablet SMARTSIG:1 Tablet(s) By Mouth Twice Daily 08/11/19 02/09/20  [provider]  furosemide (LASIX) 40 MG tablet Take 1 tablet (40 mg total) by mouth daily. 07/25/19 02/09/20  Ladona Horns, MD    Allergies    Dilaudid [hydromorphone hcl]  Review of Systems   Review of Systems  Constitutional: Negative for chills and fever.  HENT: Negative for congestion.   Eyes: Negative for visual disturbance.  Respiratory: Negative for shortness of breath.   Cardiovascular: Negative for chest pain.  Gastrointestinal: Negative for abdominal pain, diarrhea and vomiting.  Genitourinary: Negative for dysuria.   Musculoskeletal:       + Left upper extremity swelling and pain  Skin: Negative for rash.  Neurological: Negative for headaches.    Physical Exam Updated Vital Signs BP (!) 182/100 (BP Location: Right Arm)   Pulse 88   Temp 99.3 F (37.4 C) (Oral)   Resp 20   SpO2 99%   Physical Exam Vitals and nursing note reviewed.  Constitutional:      Appearance: Normal appearance.  HENT:     Head: Normocephalic.     Mouth/Throat:     Mouth: Mucous membranes are moist.  Cardiovascular:     Rate and Rhythm: Normal rate.  Pulmonary:     Effort: Pulmonary effort is normal. No respiratory distress.  Abdominal:     Palpations: Abdomen is soft.     Tenderness: There is no abdominal tenderness.  Musculoskeletal:     Comments: Palpable left radial pulse, there is edema of the left upper extremity extending from the shoulder down to the hand, palpable thrill overlying the AV fistula, pressure dressing removed, no active bleeding  Skin:    General: Skin is warm.  Neurological:     Mental Status: He is alert and oriented to person, place, and time. Mental status is at baseline.  Psychiatric:        Mood and Affect: Mood normal.     ED Results / Procedures / Treatments   Labs (all labs ordered are listed, but only abnormal results are displayed) Labs Reviewed  CBC WITH DIFFERENTIAL/PLATELET - Abnormal; Notable for the following components:      Result Value   RBC 2.04 (*)    Hemoglobin 7.1 (*)    HCT 22.8 (*)    MCV 111.8 (*)    MCH 34.8 (*)    RDW 16.9 (*)    nRBC 0.5 (*)    All other components within normal limits  BASIC METABOLIC PANEL - Abnormal; Notable for the following components:   CO2 21 (*)    Glucose, Bld 104 (*)    BUN 79 (*)    Creatinine, Ser 10.51 (*)    Calcium 8.2 (*)    GFR, Estimated 5 (*)    Anion gap 17 (*)    All other components within normal limits  PROTIME-INR    EKG None  Radiology No results found.  Procedures Procedures   Medications  Ordered in ED Medications  diphenhydrAMINE (BENADRYL) capsule 25 mg (25 mg Oral Given 08/09/20 1314)    ED Course  I have reviewed the triage  vital signs and the nursing notes.  Pertinent labs & imaging results that were available during my care of the patient were reviewed by me and considered in my medical decision making (see chart for details).    MDM Rules/Calculators/A&P                          60 year old male presents emergency department for potential AV fistula problem with left upper extremity swelling.  The fistula was accessed today for dialysis, was unsuccessful and they were unable to perform dialysis, this resulted in left upper extremity swelling.  Spoke with on-call vascular surgeon Dr. Luan Pulling, they recommend ultrasound duplex of the AV fistula to evaluate for possible hematoma and functionality.  Blood work was done which shows expected predialysis kidney function without any acute electrolyte abnormalities.  Patient does not have any active chest pain, shortness of breath or respiratory distress.  I have placed a consult called to nephrology to see if he is appropriate for rescheduling outpatient dialysis.  If this is the case then vascular will have to decide if the AV fistula can be accessed or if his upper port will have to be placed.  Patient signed out pending nephrology recommendations of vascular recommendations.  Patient is currently stable, again no respiratory distress, sitting in bed.  Left upper extremity continues to be swollen.  Final Clinical Impression(s) / ED Diagnoses Final diagnoses:  None    Rx / DC Orders ED Discharge Orders    None       Lorelle Gibbs, DO 08/09/20 1647

## 2020-08-09 NOTE — ED Notes (Signed)
Patient is being discharged from the Urgent Care and sent to the Emergency Department via POV . Per Aldona Bar, NP, patient is in need of higher level of care due to dialysis site compromised and vascular concerns. Patient is aware and verbalizes understanding of plan of care.  Vitals:   08/09/20 1144  BP: (!) 176/96  Pulse: 91  Resp: (!) 26  Temp: 97.8 F (36.6 C)  SpO2: 100%

## 2020-08-09 NOTE — Discharge Instructions (Signed)
Go to the emergency room right now for further evaluation of your left arm pain and swelling.

## 2020-08-09 NOTE — ED Triage Notes (Signed)
Pt was sent by dialysis because they were unable to access his fisutala and now he is having left arm pain  Pt also c/o full body rash the itches, for the past month

## 2020-08-09 NOTE — ED Notes (Signed)
Sitting on end of bed. Has pulled off EKG leads, pulse ox cable and BP cuff and refuses to be reconnected. Was found walking in hallway even after being told not to get out of be multiple times by both tech and RN's. A/O x 4.

## 2020-08-09 NOTE — ED Provider Notes (Signed)
Pt was seen by Dr Stanford Breed.  Consult note reviewed.   I went to discuss plan with patient as his vascular study was still pending.  I checked the room and the patient is not there.  The gown is on the floor and all of the monitoring leads are on the bed.  Staff do not know where the patient is.  It appears as if he eloped.  Dr Stanford Breed did plan on doing pt's procedure as an outpatient.  The patient may have thought he was ready to go home.   Dorie Rank, MD 08/09/20 [phone]

## 2020-08-10 ENCOUNTER — Encounter (HOSPITAL_COMMUNITY): Payer: Self-pay

## 2020-08-10 ENCOUNTER — Encounter (HOSPITAL_COMMUNITY): Payer: Self-pay | Admitting: Anesthesiology

## 2020-08-10 ENCOUNTER — Encounter (HOSPITAL_COMMUNITY)
Admission: EM | Disposition: A | Discharge status: Home Health | Payer: Self-pay | Source: Home / Self Care | Attending: Internal Medicine

## 2020-08-10 ENCOUNTER — Observation Stay (HOSPITAL_COMMUNITY): Payer: Medicare Other | Admitting: Anesthesiology

## 2020-08-10 ENCOUNTER — Ambulatory Visit (HOSPITAL_COMMUNITY): Admission: RE | Admit: 2020-08-10 | Payer: Medicare Other | Source: Home / Self Care | Admitting: Vascular Surgery

## 2020-08-10 ENCOUNTER — Observation Stay (HOSPITAL_COMMUNITY): Payer: Medicare Other

## 2020-08-10 DIAGNOSIS — I10 Essential (primary) hypertension: Secondary | ICD-10-CM

## 2020-08-10 DIAGNOSIS — K264 Chronic or unspecified duodenal ulcer with hemorrhage: Secondary | ICD-10-CM

## 2020-08-10 DIAGNOSIS — K921 Melena: Secondary | ICD-10-CM | POA: Diagnosis not present

## 2020-08-10 DIAGNOSIS — K922 Gastrointestinal hemorrhage, unspecified: Secondary | ICD-10-CM | POA: Diagnosis present

## 2020-08-10 DIAGNOSIS — N186 End stage renal disease: Secondary | ICD-10-CM | POA: Diagnosis not present

## 2020-08-10 HISTORY — PX: HEMOSTASIS CLIP PLACEMENT: SHX6857

## 2020-08-10 HISTORY — PX: ESOPHAGOGASTRODUODENOSCOPY: SHX5428

## 2020-08-10 LAB — I-STAT CHEM 8, ED
BUN: 101 mg/dL — ABNORMAL HIGH (ref 6–20)
BUN: 114 mg/dL — ABNORMAL HIGH (ref 6–20)
Calcium, Ion: 1.01 mmol/L — ABNORMAL LOW (ref 1.15–1.40)
Calcium, Ion: 0.96 mmol/L — ABNORMAL LOW (ref 1.15–1.40)
Chloride: 101 mmol/L (ref 98–111)
Chloride: 102 mmol/L (ref 98–111)
Creatinine, Ser: 11.5 mg/dL — ABNORMAL HIGH (ref 0.61–1.24)
Creatinine, Ser: 13.1 mg/dL — ABNORMAL HIGH (ref 0.61–1.24)
Glucose, Bld: 91 mg/dL (ref 70–99)
Glucose, Bld: 94 mg/dL (ref 70–99)
HCT: 24 % — ABNORMAL LOW (ref 39.0–52.0)
HCT: 16 % — ABNORMAL LOW (ref 39.0–52.0)
Hemoglobin: 8.2 g/dL — ABNORMAL LOW (ref 13.0–17.0)
Hemoglobin: 5.4 g/dL — CL (ref 13.0–17.0)
Potassium: 4.4 mmol/L (ref 3.5–5.1)
Potassium: 4.4 mmol/L (ref 3.5–5.1)
Sodium: 136 mmol/L (ref 135–145)
Sodium: 136 mmol/L (ref 135–145)
TCO2: 21 mmol/L — ABNORMAL LOW (ref 22–32)
TCO2: 19 mmol/L — ABNORMAL LOW (ref 22–32)

## 2020-08-10 LAB — CBC
HCT: 24.2 % — ABNORMAL LOW (ref 39.0–52.0)
HCT: 18.9 % — ABNORMAL LOW (ref 39.0–52.0)
HCT: 22.4 % — ABNORMAL LOW (ref 39.0–52.0)
Hemoglobin: 7.5 g/dL — ABNORMAL LOW (ref 13.0–17.0)
Hemoglobin: 5.8 g/dL — CL (ref 13.0–17.0)
Hemoglobin: 7.3 g/dL — ABNORMAL LOW (ref 13.0–17.0)
MCH: 34.6 pg — ABNORMAL HIGH (ref 26.0–34.0)
MCH: 33.9 pg (ref 26.0–34.0)
MCH: 32.7 pg (ref 26.0–34.0)
MCHC: 31 g/dL (ref 30.0–36.0)
MCHC: 30.7 g/dL (ref 30.0–36.0)
MCHC: 32.6 g/dL (ref 30.0–36.0)
MCV: 111.5 fL — ABNORMAL HIGH (ref 80.0–100.0)
MCV: 110.5 fL — ABNORMAL HIGH (ref 80.0–100.0)
MCV: 100.4 fL — ABNORMAL HIGH (ref 80.0–100.0)
Platelets: 122 10*3/uL — ABNORMAL LOW (ref 150–400)
Platelets: 128 10*3/uL — ABNORMAL LOW (ref 150–400)
Platelets: 119 10*3/uL — ABNORMAL LOW (ref 150–400)
RBC: 2.17 MIL/uL — ABNORMAL LOW (ref 4.22–5.81)
RBC: 1.71 MIL/uL — ABNORMAL LOW (ref 4.22–5.81)
RBC: 2.23 MIL/uL — ABNORMAL LOW (ref 4.22–5.81)
RDW: 16.8 % — ABNORMAL HIGH (ref 11.5–15.5)
RDW: 17 % — ABNORMAL HIGH (ref 11.5–15.5)
RDW: 22.4 % — ABNORMAL HIGH (ref 11.5–15.5)
WBC: 6.2 10*3/uL (ref 4.0–10.5)
WBC: 7.1 10*3/uL (ref 4.0–10.5)
WBC: 7.1 10*3/uL (ref 4.0–10.5)
nRBC: 0.5 % — ABNORMAL HIGH (ref 0.0–0.2)
nRBC: 0.4 % — ABNORMAL HIGH (ref 0.0–0.2)
nRBC: 0.6 % — ABNORMAL HIGH (ref 0.0–0.2)

## 2020-08-10 LAB — GLUCOSE, CAPILLARY
Glucose-Capillary: 96 mg/dL (ref 70–99)
Glucose-Capillary: 75 mg/dL (ref 70–99)
Glucose-Capillary: 101 mg/dL — ABNORMAL HIGH (ref 70–99)

## 2020-08-10 LAB — CBG MONITORING, ED
Glucose-Capillary: 78 mg/dL (ref 70–99)
Glucose-Capillary: 100 mg/dL — ABNORMAL HIGH (ref 70–99)

## 2020-08-10 LAB — PREPARE RBC (CROSSMATCH)

## 2020-08-10 LAB — RESP PANEL BY RT-PCR (FLU A&B, COVID) ARPGX2
Influenza A by PCR: NEGATIVE
Influenza B by PCR: NEGATIVE
SARS Coronavirus 2 by RT PCR: NEGATIVE

## 2020-08-10 LAB — POC OCCULT BLOOD, ED: Fecal Occult Bld: POSITIVE — AB

## 2020-08-10 LAB — HIV ANTIBODY (ROUTINE TESTING W REFLEX): HIV Screen 4th Generation wRfx: NONREACTIVE

## 2020-08-10 LAB — APTT: aPTT: 40 seconds — ABNORMAL HIGH (ref 24–36)

## 2020-08-10 SURGERY — EGD (ESOPHAGOGASTRODUODENOSCOPY)
Anesthesia: Monitor Anesthesia Care

## 2020-08-10 SURGERY — INSERTION OF DIALYSIS CATHETER
Anesthesia: Choice

## 2020-08-10 SURGERY — ESOPHAGOGASTRODUODENOSCOPY (EGD) WITH PROPOFOL
Anesthesia: Monitor Anesthesia Care

## 2020-08-10 MED ORDER — MORPHINE SULFATE (PF) 2 MG/ML IV SOLN
0.5000 mg | INTRAVENOUS | Status: DC | PRN
  Administered 2020-08-10 – 2020-08-13 (×7): 0.5 mg via INTRAVENOUS
  Filled 2020-08-10 (×7): qty 1

## 2020-08-10 MED ORDER — CALCIUM ACETATE (PHOS BINDER) 667 MG PO CAPS
667.0000 mg | ORAL_CAPSULE | Freq: Two times a day (BID) | ORAL | Status: DC
  Administered 2020-08-11 – 2020-08-13 (×2): 667 mg via ORAL
  Filled 2020-08-10 (×3): qty 1

## 2020-08-10 MED ORDER — PROPOFOL 500 MG/50ML IV EMUL
INTRAVENOUS | Status: DC | PRN
  Administered 2020-08-10: 100 ug/kg/min via INTRAVENOUS

## 2020-08-10 MED ORDER — ALTEPLASE 2 MG IJ SOLR
2.0000 mg | Freq: Once | INTRAMUSCULAR | Status: DC | PRN
  Filled 2020-08-10: qty 2

## 2020-08-10 MED ORDER — LIDOCAINE 2% (20 MG/ML) 5 ML SYRINGE
INTRAMUSCULAR | Status: DC | PRN
  Administered 2020-08-10: 100 mg via INTRAVENOUS

## 2020-08-10 MED ORDER — CAMPHOR-MENTHOL 0.5-0.5 % EX LOTN
TOPICAL_LOTION | CUTANEOUS | Status: DC | PRN
  Filled 2020-08-10: qty 222

## 2020-08-10 MED ORDER — CHLORHEXIDINE GLUCONATE 4 % EX LIQD: 60.0000 mL | Freq: Once | CUTANEOUS | Status: DC

## 2020-08-10 MED ORDER — OXYCODONE-ACETAMINOPHEN 5-325 MG PO TABS
1.0000 | ORAL_TABLET | Freq: Once | ORAL | Status: AC
  Administered 2020-08-10: 1 via ORAL
  Filled 2020-08-10: qty 1

## 2020-08-10 MED ORDER — SODIUM CHLORIDE 0.9% IV SOLUTION: Freq: Once | INTRAVENOUS | Status: AC

## 2020-08-10 MED ORDER — SODIUM CHLORIDE 0.9 % IV SOLN
8.0000 mg/h | INTRAVENOUS | Status: DC
  Administered 2020-08-10 – 2020-08-13 (×6): 8 mg/h via INTRAVENOUS
  Filled 2020-08-10 (×9): qty 80

## 2020-08-10 MED ORDER — LABETALOL HCL 5 MG/ML IV SOLN: 10.0000 mg | INTRAVENOUS | Status: DC | PRN

## 2020-08-10 MED ORDER — DARBEPOETIN ALFA 150 MCG/0.3ML IJ SOSY
150.0000 ug | PREFILLED_SYRINGE | INTRAMUSCULAR | Status: DC
  Filled 2020-08-10: qty 0.3

## 2020-08-10 MED ORDER — AMLODIPINE BESYLATE 10 MG PO TABS
10.0000 mg | ORAL_TABLET | Freq: Every day | ORAL | Status: DC
  Administered 2020-08-11 – 2020-08-16 (×6): 10 mg via ORAL
  Filled 2020-08-10 (×6): qty 1

## 2020-08-10 MED ORDER — SODIUM CHLORIDE 0.9 % IV SOLN: INTRAVENOUS | Status: DC

## 2020-08-10 MED ORDER — SODIUM CHLORIDE 0.9 % IV SOLN
8.0000 mg/h | INTRAVENOUS | Status: DC
  Filled 2020-08-10: qty 80

## 2020-08-10 MED ORDER — LIDOCAINE-PRILOCAINE 2.5-2.5 % EX CREA: 1.0000 "application " | TOPICAL_CREAM | CUTANEOUS | Status: DC | PRN

## 2020-08-10 MED ORDER — SODIUM CHLORIDE 0.9 % IV SOLN
80.0000 mg | Freq: Once | INTRAVENOUS | Status: DC
  Filled 2020-08-10: qty 80

## 2020-08-10 MED ORDER — PROPOFOL 10 MG/ML IV BOLUS
INTRAVENOUS | Status: DC | PRN
  Administered 2020-08-10: 10 mg via INTRAVENOUS

## 2020-08-10 MED ORDER — SODIUM CHLORIDE 0.9 % IV SOLN
80.0000 mg | Freq: Once | INTRAVENOUS | Status: AC
  Administered 2020-08-10: 80 mg via INTRAVENOUS
  Filled 2020-08-10: qty 80

## 2020-08-10 MED ORDER — INSULIN ASPART 100 UNIT/ML ~~LOC~~ SOLN
0.0000 [IU] | SUBCUTANEOUS | Status: DC
  Administered 2020-08-12: 2 [IU] via SUBCUTANEOUS
  Administered 2020-08-12 – 2020-08-15 (×8): 1 [IU] via SUBCUTANEOUS

## 2020-08-10 MED ORDER — CHLORHEXIDINE GLUCONATE 0.12 % MT SOLN
OROMUCOSAL | Status: AC
  Filled 2020-08-10: qty 15

## 2020-08-10 MED ORDER — HYDROXYZINE HCL 25 MG PO TABS
25.0000 mg | ORAL_TABLET | Freq: Two times a day (BID) | ORAL | Status: DC | PRN
  Administered 2020-08-10 (×2): 25 mg via ORAL
  Filled 2020-08-10 (×2): qty 1

## 2020-08-10 MED ORDER — PANTOPRAZOLE SODIUM 40 MG IV SOLR: 40.0000 mg | Freq: Two times a day (BID) | INTRAVENOUS | Status: DC

## 2020-08-10 MED ORDER — LIDOCAINE HCL (PF) 1 % IJ SOLN: 5.0000 mL | INTRAMUSCULAR | Status: DC | PRN

## 2020-08-10 MED ORDER — SODIUM CHLORIDE 0.9% IV SOLUTION: Freq: Once | INTRAVENOUS | Status: DC

## 2020-08-10 MED ORDER — HYDRALAZINE HCL 20 MG/ML IJ SOLN
10.0000 mg | INTRAMUSCULAR | Status: DC | PRN
  Administered 2020-08-10: 10 mg via INTRAVENOUS
  Filled 2020-08-10 (×2): qty 1

## 2020-08-10 MED ORDER — CLONIDINE HCL 0.2 MG PO TABS
0.2000 mg | ORAL_TABLET | Freq: Two times a day (BID) | ORAL | Status: DC
  Administered 2020-08-10 – 2020-08-16 (×11): 0.2 mg via ORAL
  Filled 2020-08-10 (×11): qty 1

## 2020-08-10 MED ORDER — MIDAZOLAM HCL 2 MG/2ML IJ SOLN
INTRAMUSCULAR | Status: AC
  Filled 2020-08-10: qty 2

## 2020-08-10 MED ORDER — SODIUM CHLORIDE 0.9 % IV SOLN: 100.0000 mL | INTRAVENOUS | Status: DC | PRN

## 2020-08-10 MED ORDER — HEPARIN SODIUM (PORCINE) 1000 UNIT/ML DIALYSIS
1000.0000 [IU] | INTRAMUSCULAR | Status: DC | PRN
  Filled 2020-08-10: qty 1

## 2020-08-10 MED ORDER — PROPOFOL 10 MG/ML IV BOLUS
INTRAVENOUS | Status: AC
  Filled 2020-08-10: qty 20

## 2020-08-10 MED ORDER — FUROSEMIDE 10 MG/ML IJ SOLN
40.0000 mg | Freq: Once | INTRAMUSCULAR | Status: DC
  Filled 2020-08-10: qty 4

## 2020-08-10 MED ORDER — GLYCOPYRROLATE PF 0.2 MG/ML IJ SOSY
PREFILLED_SYRINGE | INTRAMUSCULAR | Status: DC | PRN
  Administered 2020-08-10: .2 mg via INTRAVENOUS

## 2020-08-10 MED ORDER — SODIUM CHLORIDE 0.9 % IV SOLN
INTRAVENOUS | Status: DC
  Administered 2020-08-10: 10 mL/h via INTRAVENOUS

## 2020-08-10 MED ORDER — CHLORHEXIDINE GLUCONATE CLOTH 2 % EX PADS
6.0000 | MEDICATED_PAD | Freq: Every day | CUTANEOUS | Status: DC
  Administered 2020-08-11 – 2020-08-16 (×4): 6 via TOPICAL

## 2020-08-10 MED ORDER — INSULIN ASPART 100 UNIT/ML ~~LOC~~ SOLN: SUBCUTANEOUS | Status: DC | PRN

## 2020-08-10 MED ORDER — DOXERCALCIFEROL 4 MCG/2ML IV SOLN
3.0000 ug | INTRAVENOUS | Status: DC
  Filled 2020-08-10: qty 2

## 2020-08-10 MED ORDER — DARBEPOETIN ALFA 150 MCG/0.3ML IJ SOSY: 120.0000 ug | PREFILLED_SYRINGE | INTRAMUSCULAR | Status: DC

## 2020-08-10 MED ORDER — CHLORHEXIDINE GLUCONATE 0.12 % MT SOLN
15.0000 mL | OROMUCOSAL | Status: AC
  Administered 2020-08-10: 15 mL via OROMUCOSAL
  Filled 2020-08-10: qty 15

## 2020-08-10 MED ORDER — FENTANYL CITRATE (PF) 250 MCG/5ML IJ SOLN
INTRAMUSCULAR | Status: AC
  Filled 2020-08-10: qty 5

## 2020-08-10 MED ORDER — CALCIUM ACETATE (PHOS BINDER) 667 MG PO CAPS
1334.0000 mg | ORAL_CAPSULE | Freq: Every day | ORAL | Status: DC
  Administered 2020-08-11 – 2020-08-12 (×2): 1334 mg via ORAL
  Filled 2020-08-10 (×2): qty 2

## 2020-08-10 MED ORDER — CEFAZOLIN SODIUM-DEXTROSE 2-4 GM/100ML-% IV SOLN
2.0000 g | INTRAVENOUS | Status: DC
  Filled 2020-08-10 (×2): qty 100

## 2020-08-10 MED ORDER — PENTAFLUOROPROP-TETRAFLUOROETH EX AERO: 1.0000 "application " | INHALATION_SPRAY | CUTANEOUS | Status: DC | PRN

## 2020-08-10 NOTE — Consult Note (Addendum)
[facility] Renal Consultation Note    Indication for Consultation:  Management of ESRD/hemodialysis; anemia, hypertension/volume and secondary hyperparathyroidism  UVO:[id number], Milford Cage, NP  HPI: Daniel [name] is a 60 y.o. male with a past medical history significant for ESRD, T2DM, Hepatitis C, HTN, and recurrent GIBs. He receives outpatient hemodialysis at [facility]) TTS. Patient presents to the ED c/o pain to L upper extremity in addition to having frequent black stools. Reviewed records in [place]. Noted that patient developed a hematoma to L AVF d/t traumatic access. Reviewed outpatient HD records. Last HD treatment was on 08/07/20; however, only stayed for 2 hours. On 08/09/20. patient only stayed for HD treatment for few minutes; more likely due to pain at L AVF site. Hemoglobin at admission was 7.5 which then trended downward to 5.8. Patient received fluid bolus and protonix infusion was initiated. Patient ordered to received 2 units PRBCs. GI is following. Seen and examined patient at bedside. Currently on RA. Denies SOB, CP, and ABD pain. Labwork shows: K+ 4.4, BUN 114, and Cr 13.10. CXR (-) pleural effusions and CT Abd/Pelvis showed no acute findings. L AVF cannot be used at this time. Plan for [facility] placement then HD treatment in AM 08/11/20.  Past Medical History:  Diagnosis Date  . Anemia   . Diabetes mellitus without complication ([place])    patient denies  . Dialysis patient ([place])   . End stage chronic kidney disease ([place])   . Hypertension   . ICH (intracerebral hemorrhage) ([place]) 05/20/2017  . Shoulder pain, left 06/28/2013   Past Surgical History:  Procedure Laterality Date  . APPENDECTOMY    . AV FISTULA PLACEMENT Left 08/03/2019   Procedure: LEFT ARM ARTERIOVENOUS (AV) CEPHALIC  FISTULA CREATION;  Surgeon: Waynetta Sandy, MD;  Location: [place];  Service: Vascular;  Laterality: Left;  . BIOPSY  06/30/2019   Procedure: BIOPSY;   Surgeon: Ronald Lobo, MD;  Location: [place];  Service: Endoscopy;;  . BIOPSY  08/02/2019   Procedure: BIOPSY;  Surgeon: Yetta Flock, MD;  Location: [place];  Service: Gastroenterology;;  . COLONOSCOPY  01/23/2012   Procedure: COLONOSCOPY;  Surgeon: Danie Binder, MD;  [facility];  Service: Endoscopy;  Laterality: N/A;  11:10 AM  . COLONOSCOPY WITH PROPOFOL N/A 06/30/2019   Procedure: COLONOSCOPY WITH PROPOFOL;  Surgeon: Ronald Lobo, MD;  Location: [place];  Service: Endoscopy;  Laterality: N/A;  . ENTEROSCOPY N/A 08/02/2019   Procedure: ENTEROSCOPY;  Surgeon: Yetta Flock, MD;  Location: [facility] ENDOSCOPY;  Service: Gastroenterology;  Laterality: N/A;  . ESOPHAGOGASTRODUODENOSCOPY (EGD) WITH PROPOFOL N/A 06/30/2019   Procedure: ESOPHAGOGASTRODUODENOSCOPY (EGD) WITH PROPOFOL;  Surgeon: Ronald Lobo, MD;  Location: [place];  Service: Endoscopy;  Laterality: N/A;  . FISTULA SUPERFICIALIZATION Left 10/17/2019   Procedure: LEFT UPPER EXTREMITY FISTULA REVISION, SIDE BRANCH LIGATION,  AND SUPERFICIALIZATION;  Surgeon: Marty Heck, MD;  Location: [place];  Service: Vascular;  Laterality: Left;  . GIVENS CAPSULE STUDY N/A 06/30/2019   Procedure: GIVENS CAPSULE STUDY;  Surgeon: Ronald Lobo, MD;  Location: [facility] ENDOSCOPY;  Service: Endoscopy;  Laterality: N/A;  . HEMOSTASIS CONTROL  08/02/2019   Procedure: HEMOSTASIS CONTROL;  Surgeon: Yetta Flock, MD;  Location: [facility] ENDOSCOPY;  Service: Gastroenterology;;  . INCISION AND DRAINAGE ABSCESS N/A 06/29/2016   Procedure: INCISION AND DRAINAGE ABDOMINAL WALL ABSCESS;  Surgeon: Alphonsa Overall, MD;  Location: [facility];  Service: General;  Laterality: N/A;  . INSERTION OF DIALYSIS CATHETER Right  08/03/2019   Procedure: INSERTION OF DIALYSIS CATHETER;  Surgeon: Waynetta Sandy, MD;  Location: [place];  Service: Vascular;  Laterality: Right;  . INSERTION OF DIALYSIS CATHETER Right 10/22/2019   Procedure:  INSERTION OF 23CM TUNNELED DIALYSIS CATHETER RIGHT INTERNAL JUGULAR;  Surgeon: Angelia Mould, MD;  Location: [place];  Service: Vascular;  Laterality: Right;  . Left heel surgery    . PENILE BIOPSY N/A 03/26/2020   Procedure: PENILE ULCER DEBRIDEMENT;  Surgeon: Remi Haggard, MD;  Location: [facility];  Service: Urology;  Laterality: N/A;  30 MINS   Family History  Problem Relation Age of Onset  . Colon cancer Neg Hx    Social History:  reports that he has been smoking cigarettes. He has a 30.00 pack-year smoking history. He has never used smokeless tobacco. He reports previous alcohol use of about 12.0 standard drinks of alcohol per week. He reports previous drug use. Drug: "Crack" cocaine. Allergies  Allergen Reactions  . Dilaudid [Hydromorphone Hcl] Itching and Other (See Comments)    Pt reports itchiness after IM injection    Prior to Admission medications   Medication Sig Start Date End Date Taking? Authorizing Provider  amLODipine (NORVASC) 10 MG tablet Take 10 mg by mouth daily.    [provider]  blood glucose meter kit and supplies KIT Dispense based on patient and insurance preference. Use up to four times daily as directed. (FOR ICD-9 250.00, 250.01). 07/01/16   Nita Sells, MD  Calcium Acetate 667 MG TABS Take 667-1,334 mg by mouth See admin instructions. Take 667 mg in the morning and afternoon and 1,334 in the evening 03/20/20   [provider]  cetirizine (ZYRTEC ALLERGY) 10 MG tablet Take 1 tablet (10 mg total) by mouth daily. Patient taking differently: Take 10 mg by mouth daily as needed for allergies. 02/23/20   [name], NP  cloNIDine (CATAPRES) 0.2 MG tablet Take  1 tablet twice daily . If makes drowsy take 2 tablets - after he is off of work Patient taking differently: Take 0.2 mg by mouth See admin instructions. If makes drowsy take 2 tablets - after he is off of work 07/13/19   [name], NP  gabapentin (NEURONTIN)  300 MG capsule Take 1 capsule (300 mg total) by mouth 3 (three) times daily. Take 334m twice daily and 6058mat bedtime. Patient taking differently: Take 300-600 mg by mouth See admin instructions. Take 30094mwice daily and 600m39m bedtime. 02/23/20   EdwaKerin Perna  hydrOXYzine (ATARAX/VISTARIL) 25 MG tablet Take 1 tablet (25 mg total) by mouth 2 (two) times daily as needed. Patient taking differently: Take 25 mg by mouth 2 (two) times daily as needed for anxiety. 02/23/20   EdwaKerin Perna  lidocaine-prilocaine (EMLA) cream Apply 1 application topically daily as needed (pain). 01/24/20   [provider]  oxyCODONE-acetaminophen (PERCOCET/ROXICET) 5-325 MG tablet Take 1 tablet by mouth every 6 (six) hours as needed for severe pain. 4/9/0/9/62licDelora Fuel  colchicine 0.6 MG tablet Take 0.5 tablets (0.3 mg total) by mouth 2 (two) times daily. 07/24/20 07/29/20  MillNoemi Chapel  ferrous sulfate 325 (65 FE) MG tablet SMARTSIG:1 Tablet(s) By Mouth Twice Daily 08/11/19 02/09/20  [provider]  furosemide (LASIX) 40 MG tablet Take 1 tablet (40 mg total) by mouth daily. 07/25/19 02/09/20  JoneLadona Horns   Current Facility-Administered Medications  Medication Dose Route Frequency Provider Last Rate Last Admin  . [  MAR Hold] 0.9 %  sodium chloride infusion (Manually program via Guardrails IV Fluids)   Intravenous Once Kc, Ramesh, MD      . 0.9 %  sodium chloride infusion   Intravenous Continuous Nolon Nations, MD 10 mL/hr at 08/10/20 0950 New Bag at 08/10/20 0950  . [MAR Hold] 0.9 %  sodium chloride infusion  100 mL Intravenous PRN Adelfa Koh, NP      . Doug Sou Hold] 0.9 %  sodium chloride infusion  100 mL Intravenous PRN Tobie Poet E, NP      . 0.9 %  sodium chloride infusion   Intravenous Continuous Pyrtle, Lajuan Lines, MD 200 mL/hr at 08/10/20 1604 Rate Change at 08/10/20 1604  . [MAR Hold] alteplase (CATHFLO ACTIVASE) injection 2 mg  2 mg Intracatheter  Once PRN Adelfa Koh, NP      . Doug Sou Hold] amLODipine (NORVASC) tablet 10 mg  10 mg Oral Daily Rise Patience, MD      . Doug Sou Hold] calcium acetate (PHOSLO) capsule 1,334 mg  1,334 mg Oral Q supper Rise Patience, MD      . [facility] Hold] calcium acetate (PHOSLO) capsule 667 mg  667 mg Oral BID WC Rise Patience, MD      . Doug Sou Hold] camphor-menthol [facility]) lotion   Topical PRN Rise Patience, MD      . Doug Sou Hold] cloNIDine (CATAPRES) tablet 0.2 mg  0.2 mg Oral BID Rise Patience, MD      . Doug Sou Hold] furosemide (LASIX) injection 40 mg  40 mg Intravenous Once Kc, Maren Beach, MD      . Doug Sou Hold] heparin injection 1,000 Units  1,000 Units Dialysis PRN Adelfa Koh, NP      . Doug Sou Hold] hydrALAZINE (APRESOLINE) injection 10 mg  10 mg Intravenous Q4H PRN Rise Patience, MD   10 mg at 08/10/20 0527  . [MAR Hold] hydrOXYzine (ATARAX/VISTARIL) tablet 25 mg  25 mg Oral BID PRN Rise Patience, MD   25 mg at 08/10/20 0859  . [MAR Hold] insulin aspart (novoLOG) injection 0-9 Units  0-9 Units Subcutaneous Q4H Rise Patience, MD      . labetalol (NORMODYNE) injection 10 mg  10 mg Intravenous Q3H PRN Kc, Maren Beach, MD      . Doug Sou Hold] lidocaine (PF) (XYLOCAINE) 1 % injection 5 mL  5 mL Intradermal PRN Adelfa Koh, NP      . Doug Sou Hold] lidocaine-prilocaine (EMLA) cream 1 application  1 application Topical PRN Adelfa Koh, NP      . Doug Sou Hold] morphine 2 MG/ML injection 0.5 mg  0.5 mg Intravenous Q3H PRN Rise Patience, MD   0.5 mg at 08/10/20 0900  . pantoprazole (PROTONIX) 80 mg in sodium chloride 0.9 % 100 mL (0.8 mg/mL) infusion  8 mg/hr Intravenous Continuous Kc, Ramesh, MD      . Doug Sou Hold] pantoprazole (PROTONIX) 80 mg in sodium chloride 0.9 % 100 mL IVPB  80 mg Intravenous Once Kc, Ramesh, MD      . Doug Sou Hold] pantoprazole (PROTONIX) injection 40 mg  40 mg Intravenous Q12H Antonieta Pert, MD      . Doug Sou Hold]  pentafluoroprop-tetrafluoroeth (GEBAUERS) aerosol 1 application  1 application Topical PRN Adelfa Koh, NP       Facility-Administered Medications Ordered in Other Encounters  Medication Dose Route Frequency Provider Last Rate Last Admin  . chlorhexidine (PERIDEX) 0.12 % solution  Labs: Basic Metabolic Panel: Recent Labs  Lab 08/04/20 0119 08/09/20 1537 08/10/20 0117 08/10/20 0845  NA 132* 138 136 136  K 5.0 4.7 4.4 4.4  CL 90* 100 101 102  CO2 25 21*  --   --   GLUCOSE 112* 104* 91 94  BUN 87* 79* 101* 114*  CREATININE 11.50* 10.51* 11.50* 13.10*  CALCIUM 8.4* 8.2*  --   --    CBC: Recent Labs  Lab 08/04/20 0119 08/09/20 1440 08/10/20 0117 08/10/20 0403 08/10/20 0845  WBC 7.2 5.8  --  6.2 7.1  NEUTROABS  --  3.4  --   --   --   HGB 8.7* 7.1* 8.2* 7.5* 5.8*  5.4*  HCT 27.2* 22.8* 24.0* 24.2* 18.9*  16.0*  MCV 107.1* 111.8*  --  111.5* 110.5*  PLT 149* 169  --  122* 128*   CBG: Recent Labs  Lab 08/10/20 0433 08/10/20 0742 08/10/20 0929  GLUCAP 78 100* 96   Studies/Results: CT ABDOMEN PELVIS WO CONTRAST  Result Date: 08/10/2020 CLINICAL DATA:  Generalized abdominal pain, anemia. EXAM: CT ABDOMEN AND PELVIS WITHOUT CONTRAST TECHNIQUE: Multidetector CT imaging of the abdomen and pelvis was performed following the standard protocol without IV contrast. COMPARISON:  July 07, 2020. FINDINGS: Lower chest: No acute abnormality. Hepatobiliary: No focal liver abnormality is seen. No gallstones, gallbladder wall thickening, or biliary dilatation. Pancreas: Unremarkable. No pancreatic ductal dilatation or surrounding inflammatory changes. Spleen: Normal in size without focal abnormality. Adrenals/Urinary Tract: Adrenal glands appear normal. Small nonobstructive left renal calculus is noted. No hydronephrosis or renal obstruction is noted. Urinary bladder is unremarkable. Stomach/Bowel: The stomach appears normal. There is no evidence of bowel obstruction or  inflammation. Vascular/Lymphatic: Aortic atherosclerosis. No enlarged abdominal or pelvic lymph nodes. Reproductive: Prostate is unremarkable. Other: No abdominal wall hernia or abnormality. No abdominopelvic ascites. Musculoskeletal: No acute or significant osseous findings. IMPRESSION: Small nonobstructive left renal calculus. No hydronephrosis or renal obstruction is noted. No acute abnormality seen in the abdomen or pelvis. Aortic Atherosclerosis (ICD10-I70.0). Electronically Signed   By: Marijo Conception M.D.   On: 08/10/2020 12:52   Physical Exam: Vitals:   08/10/20 1314 08/10/20 1411 08/10/20 1507 08/10/20 1608  BP: (!) 161/71 (!) 191/97 (!) 213/99 (!) 133/55  Pulse: 87 89 90 100  Resp: _0 Temp: 97.7 F (36.5 C) 97.9 F (36.6 C) 97.9 F (36.6 C)   TempSrc: Oral Oral Temporal   SpO2: 100% 100% 100% 100%  Weight:   120.2 kg   Height:   5' 11" (1.803 m)      General: WDWN NAD Head: NCAT sclera not icteric MMM Lungs: Diminished upper lobes; No wheeze, rales or rhonchi.  Heart: RRR. No murmur, rubs or gallops.  Abdomen: large, soft, nontender, +BS, no guarding, no rebound tenderness Lower extremities:no edema bilateral lower extremities Neuro: AAOx3. Moves all extremities spontaneously. Psych:  Responds to questions appropriately with a normal affect. Dialysis Access: L AVF- cannot be used at this time  Dialysis Orders:   TTS - [facility]  4hrs, BFR 450, DFR 500,  EDW 116.5kg, 3K/ 2.5Ca  Access: L AVF (not in use) Mircera 260mg q2wks - last 07/31/2020 Hectorol 375m IV qHD -last 08/09/2020 Home meds: Calcium Acetate 66752m tablets TID and 2 tablets with snacks  Last Labs: Hgb 5.8, K 4.4, Ca 8.2 (4/9 OP HD), P 8.8 (4/9 OP HD), PTH 388 (3/17 OP HD)  Assessment/Plan: 1.  GIB- Patient underwent upper  endoscopy today. Received 2 units PRBCs for Hgb 5.8. Order to re-check Hgb post transfusion. Followed by GI 2. Hematoma L AVF- Avoid AVF (per [facility]VS). Plan for [facility]  placement then HD treatment tomorrow 08/11/20. HD orders in place for tomorrow. 3.  ESRD -  HD TTS. Noted patient was over EDW this admission. UFG for 3-4L as tolerated.  4.  Hypertension/volume  - Bps currently variable; Patient not in acute respiratory distress at this time. Plan for HD treatment tomorrow 4/16 after [facility] placement. 5.  Anemia of CKD - Hgb 5.8; received 2 units PRBCs; continue to monitor trend. Will resume ESA 6.  Secondary Hyperparathyroidism -  Will add RFP with scheduled Hgb check today to check for Ca and PO4 levels. Will resume Hectorol and binder. 7.  Nutrition - NPO; can advance as tolerated to renal diet when clinically stable.  Tobie Poet, NP [facility] 08/10/2020, 4:15 PM

## 2020-08-10 NOTE — Consult Note (Signed)
Consultation  Referring Provider:  [facility] Primary Care Physician:  [name], NP Primary Gastroenterologist:   Althia Forts  Reason for Consultation:   Anemia, melena  HPI: Daniel [name] is a 60 y.o. male, who we are asked to see for anemia, heme positive stool and complaint of black stools x3 days at home. Patient presented to the emergency room early this morning with complaints of left upper extremity pain.  He had also been in the ER yesterday with left upper extremity pain which then worsened. Patient has history of end-stage renal disease is on dialysis, hypertension, has history of intracranial hemorrhage, is status post appendectomy and has had prior GI evaluation for anemia and heme positive stool.  Is not on any aspirin or NSAIDs.  Stool documented heme positive today. Hemoglobin yesterday 8.2, down to 7.1 , then 5.8 this a.m. MCV 111. Review of labs shows hemoglobin of 8.7 on 08/04/2020 and 07/24/2020 hemoglobin 10.7. COVID-19 negative  Patient has been seen by vascular this surgery this morning, and plan is for insertion of tunneled dialysis catheter.  Patient last had endoscopic evaluation with enteroscopy in April 2021 per Dr. Havery Moros with finding of few sessile polyps in the duodenal bulb question small AVM at the bulb which was treated with APC, there was another AVM at the duodenal bulb also treated with APC and 1 AVM in the proximal jejunum treated with APC.  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  He was to follow-up with Dr. Havery Moros as an outpatient but that did not occur. In March 2021 he had colonoscopy per Dr. Cristina Gong for similar reasons showing sigmoid diverticuli otherwise negative exam and EGD at that time was negative with the exception of small duodenal polyps.  Patient is not able to give a good history at this point has been crying and somewhat agitated since brought back from preop.  He says he needs to urinate and is  unable.  He denies any abdominal pain no nausea or vomiting, no heartburn or indigestion.  He does confirm that he has been seeing black tarry stools at home over the past couple of days Denies any use of aspirin or NSAIDs.    Past Medical History:  Diagnosis Date  . Anemia   . Diabetes mellitus without complication ([place])    patient denies  . Dialysis patient ([place])   . End stage chronic kidney disease ([place])   . Hypertension   . ICH (intracerebral hemorrhage) ([place]) 05/20/2017  . Shoulder pain, left 06/28/2013    Past Surgical History:  Procedure Laterality Date  . APPENDECTOMY    . AV FISTULA PLACEMENT Left 08/03/2019   Procedure: LEFT ARM ARTERIOVENOUS (AV) CEPHALIC  FISTULA CREATION;  Surgeon: Waynetta Sandy, MD;  Location: [place];  Service: Vascular;  Laterality: Left;  . BIOPSY  06/30/2019   Procedure: BIOPSY;  Surgeon: Ronald Lobo, MD;  Location: [place];  Service: Endoscopy;;  . BIOPSY  08/02/2019   Procedure: BIOPSY;  Surgeon: Yetta Flock, MD;  Location: [place];  Service: Gastroenterology;;  . COLONOSCOPY  01/23/2012   Procedure: COLONOSCOPY;  Surgeon: Danie Binder, MD;  [facility];  Service: Endoscopy;  Laterality: N/A;  11:10 AM  . COLONOSCOPY WITH PROPOFOL N/A 06/30/2019   Procedure: COLONOSCOPY WITH PROPOFOL;  Surgeon: Ronald Lobo, MD;  Location: [place];  Service: Endoscopy;  Laterality: N/A;  . ENTEROSCOPY N/A 08/02/2019   Procedure: ENTEROSCOPY;  Surgeon: Yetta Flock, MD;  Location: [facility];  Service: Gastroenterology;  Laterality: N/A;  . ESOPHAGOGASTRODUODENOSCOPY (EGD) WITH PROPOFOL N/A 06/30/2019   Procedure: ESOPHAGOGASTRODUODENOSCOPY (EGD) WITH PROPOFOL;  Surgeon: Ronald Lobo, MD;  Location: [place];  Service: Endoscopy;  Laterality: N/A;  . FISTULA SUPERFICIALIZATION Left 10/17/2019   Procedure: LEFT UPPER EXTREMITY FISTULA REVISION, SIDE BRANCH LIGATION,  AND SUPERFICIALIZATION;  Surgeon: Marty Heck, MD;  Location: [place];  Service: Vascular;  Laterality: Left;  . GIVENS CAPSULE STUDY N/A 06/30/2019   Procedure: GIVENS CAPSULE STUDY;  Surgeon: Ronald Lobo, MD;  Location: [facility];  Service: Endoscopy;  Laterality: N/A;  . HEMOSTASIS CONTROL  08/02/2019   Procedure: HEMOSTASIS CONTROL;  Surgeon: Yetta Flock, MD;  Location: [place] [facility];  Service: Gastroenterology;;  . INCISION AND DRAINAGE ABSCESS N/A 06/29/2016   Procedure: INCISION AND DRAINAGE ABDOMINAL WALL ABSCESS;  Surgeon: Alphonsa Overall, MD;  Location: [facility];  Service: General;  Laterality: N/A;  . INSERTION OF DIALYSIS CATHETER Right 08/03/2019   Procedure: INSERTION OF DIALYSIS CATHETER;  Surgeon: Waynetta Sandy, MD;  Location: [place];  Service: Vascular;  Laterality: Right;  . INSERTION OF DIALYSIS CATHETER Right 10/22/2019   Procedure: INSERTION OF 23CM TUNNELED DIALYSIS CATHETER RIGHT INTERNAL JUGULAR;  Surgeon: Angelia Mould, MD;  Location: [place];  Service: Vascular;  Laterality: Right;  . Left heel surgery    . PENILE BIOPSY N/A 03/26/2020   Procedure: PENILE ULCER DEBRIDEMENT;  Surgeon: Remi Haggard, MD;  Location: [facility];  Service: Urology;  Laterality: N/A;  74 MINS    Prior to Admission medications   Medication Sig Start Date End Date Taking? Authorizing Provider  amLODipine (NORVASC) 10 MG tablet Take 10 mg by mouth daily.    [provider]  blood glucose meter kit and supplies KIT Dispense based on patient and insurance preference. Use up to four times daily as directed. (FOR ICD-9 250.00, 250.01). 07/01/16   Nita Sells, MD  Calcium Acetate 667 MG TABS Take 667-1,334 mg by mouth See admin instructions. Take 667 mg in the morning and afternoon and 1,334 in the evening 03/20/20   [provider]  cetirizine (ZYRTEC ALLERGY) 10 MG tablet Take 1 tablet (10 mg total) by mouth daily. Patient taking differently: Take 10 mg by mouth daily as needed for  allergies. 02/23/20   [name], NP  cloNIDine (CATAPRES) 0.2 MG tablet Take  1 tablet twice daily . If makes drowsy take 2 tablets - after he is off of work Patient taking differently: Take 0.2 mg by mouth See admin instructions. If makes drowsy take 2 tablets - after he is off of work 07/13/19   [name], NP  gabapentin (NEURONTIN) 300 MG capsule Take 1 capsule (300 mg total) by mouth 3 (three) times daily. Take 354m twice daily and 6068mat bedtime. Patient taking differently: Take 300-600 mg by mouth See admin instructions. Take 30042mwice daily and 600m89m bedtime. 02/23/20   EdwaKerin Perna  hydrOXYzine (ATARAX/VISTARIL) 25 MG tablet Take 1 tablet (25 mg total) by mouth 2 (two) times daily as needed. Patient taking differently: Take 25 mg by mouth 2 (two) times daily as needed for anxiety. 02/23/20   EdwaKerin Perna  lidocaine-prilocaine (EMLA) cream Apply 1 application topically daily as needed (pain). 01/24/20   [provider]  oxyCODONE-acetaminophen (PERCOCET/ROXICET) 5-325 MG tablet Take 1 tablet by mouth every 6 (six) hours as needed for severe pain. 4/9/4/0/98licDelora Fuel  colchicine 0.6 MG tablet Take 0.5 tablets (0.3 mg total) by mouth 2 (two) times daily. 07/24/20 07/29/20  Noemi Chapel, MD  ferrous sulfate 325 (65 FE) MG tablet SMARTSIG:1 Tablet(s) By Mouth Twice Daily 08/11/19 02/09/20  [provider]  furosemide (LASIX) 40 MG tablet Take 1 tablet (40 mg total) by mouth daily. 07/25/19 02/09/20  Ladona Horns, MD    Current Facility-Administered Medications  Medication Dose Route Frequency Provider Last Rate Last Admin  . 0.9 %  sodium chloride infusion   Intravenous Continuous Waynetta Sandy, MD 10 mL/hr at 08/10/20 0852 10 mL/hr at 08/10/20 0852  . 0.9 %  sodium chloride infusion   Intravenous Continuous Nolon Nations, MD 10 mL/hr at 08/10/20 0950 New Bag at 08/10/20 0950  . [MAR Hold] amLODipine (NORVASC)  tablet 10 mg  10 mg Oral Daily Rise Patience, MD      . [facility] Hold] calcium acetate (PHOSLO) capsule 1,334 mg  1,334 mg Oral Q supper Rise Patience, MD      . [facility] Hold] calcium acetate (PHOSLO) capsule 667 mg  667 mg Oral BID WC Rise Patience, MD      . Doug Sou Hold] camphor-menthol [facility]) lotion   Topical PRN Rise Patience, MD      . ceFAZolin (ANCEF) IVPB 2g/100 mL premix  2 g Intravenous To SSTC Waynetta Sandy, MD      . chlorhexidine (HIBICLENS) 4 % liquid 4 application  60 mL Topical Once Waynetta Sandy, MD       And  . chlorhexidine (HIBICLENS) 4 % liquid 4 application  60 mL Topical Once Waynetta Sandy, MD      . [facility] Hold] cloNIDine (CATAPRES) tablet 0.2 mg  0.2 mg Oral BID Rise Patience, MD      . [facility] Hold] hydrALAZINE (APRESOLINE) injection 10 mg  10 mg Intravenous Q4H PRN Rise Patience, MD   10 mg at 08/10/20 0527  . [MAR Hold] hydrOXYzine (ATARAX/VISTARIL) tablet 25 mg  25 mg Oral BID PRN Rise Patience, MD   25 mg at 08/10/20 0859  . [MAR Hold] insulin aspart (novoLOG) injection 0-9 Units  0-9 Units Subcutaneous Q4H Rise Patience, MD      . [facility] Hold] morphine 2 MG/ML injection 0.5 mg  0.5 mg Intravenous Q3H PRN Rise Patience, MD   0.5 mg at 08/10/20 0900  . [MAR Hold] pantoprazole (PROTONIX) injection 40 mg  40 mg Intravenous Q12H Rise Patience, MD       Facility-Administered Medications Ordered in Other Encounters  Medication Dose Route Frequency Provider Last Rate Last Admin  . chlorhexidine (PERIDEX) 0.12 % solution             Allergies as of 08/09/2020 - Review Complete 08/09/2020  Allergen Reaction Noted  . Dilaudid [hydromorphone hcl] Itching 07/29/2020    Family History  Problem Relation Age of Onset  . Colon cancer Neg Hx     Social History   Socioeconomic History  . Marital status: Single    Spouse name: Not on file  . Number of children: Not on file  .  Years of education: Not on file  . Highest education level: Not on file  Occupational History  . Not on file  Tobacco Use  . Smoking status: Current Every Day Smoker    Packs/day: 1.00    Years: 30.00    Pack years: 30.00    Types: Cigarettes  . Smokeless tobacco:  Never Used  Vaping Use  . Vaping Use: Never used  Substance and Sexual Activity  . Alcohol use: Not Currently    Alcohol/week: 12.0 standard drinks    Types: 12 Cans of beer per week  . Drug use: Not Currently    Types: "Crack" cocaine    Comment: last in 2020  . Sexual activity: Yes    Birth control/protection: None  Other Topics Concern  . Not on file  Social History Narrative  . Not on file   Social Determinants of Health   Financial Resource Strain: Not on file  Food Insecurity: Not on file  Transportation Needs: Not on file  Physical Activity: Not on file  Stress: Not on file  Social Connections: Not on file  Intimate Partner Violence: Not on file    Review of Systems: Pertinent positive and negative review of systems were noted in the above HPI section.  All other review of systems was otherwise negative.  Physical Exam: Vital signs in last 24 hours: Temp:  [98.3 F (36.8 C)-98.8 F (37.1 C)] 98.3 F (36.8 C) (04/15 0930) Pulse Rate:  [83-102] 88 (04/15 0930) Resp:  [12-29] 24 (04/15 0930) BP: (131-191)/(49-104) 188/51 (04/15 0930) SpO2:  [97 %-100 %] 100 % (04/15 0930) Weight:  [120.2 kg] 120.2 kg (04/15 0001)   General:   Alert,  Well-developed, obese African-American male, somewhat agitated  Head:  Normocephalic and atraumatic. Eyes:  Sclera clear, no icterus.   Conjunctiva pale. Ears:  Normal auditory acuity. Nose:  No deformity, discharge,  or lesions. Mouth:  No deformity or lesions.   Neck:  Supple; no masses or thyromegaly. Lungs:  Clear throughout to auscultation.   No wheezes, crackles, or rhonchi.  Heart:  Regular rate and rhythm; no murmurs, clicks, rubs,  or gallops. Abdomen:   Soft, obese, no focal tenderness no guarding or rebound, BS active,nonpalp mass or hsm.   Rectal: Not done Msk:  Symmetrical without gross deformities. . Pulses:  Normal pulses noted. Extremities: Left upper extremity swollen and tender Neurologic:  Alert and  oriented x4;  grossly normal neurologically. Skin:  Intact without significant lesions or rashes.. Psych:  Alert and semicooperative Intake/Output from previous day: No intake/output data recorded. Intake/Output this shift: Total I/O In: 500 [I.V.:500] Out: -   Lab Results: Recent Labs    08/09/20 1440 08/10/20 0117 08/10/20 0403 08/10/20 0845  WBC 5.8  --  6.2 7.1  HGB 7.1* 8.2* 7.5* 5.8*  5.4*  HCT 22.8* 24.0* 24.2* 18.9*  16.0*  PLT 169  --  122* 128*   BMET Recent Labs    08/09/20 1537 08/10/20 0117 08/10/20 0845  NA 138 136 136  K 4.7 4.4 4.4  CL 100 101 102  CO2 21*  --   --   GLUCOSE 104* 91 94  BUN 79* 101* 114*  CREATININE 10.51* 11.50* 13.10*  CALCIUM 8.2*  --   --    LFT No results for input(s): PROT, ALBUMIN, AST, ALT, ALKPHOS, BILITOT, BILIDIR, IBILI in the last 72 hours. PT/INR Recent Labs    08/09/20 1524  LABPROT 14.0  INR 1.1   Hepatitis Panel No results for input(s): HEPBSAG, HCVAB, HEPAIGM, HEPBIGM in the last 72 hours.    IMPRESSION:  #44 60 year old African-American male, with end-stage renal disease on dialysis who has been in the emergency room twice over the past 24 hours with complaints of left upper extremity pain. Plan was for him to go to the OR for dialysis  catheter replacement however that has been placed on hold secondary to GI bleeding  #2 melena x3 days, and drop in hemoglobin from 8.2 yesterday to 5.8 this a.m. 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 Rule out bleeding secondary to additional small bowel or gastric AVMs, rule out peptic ulcer disease, gastropathy Patient had colonoscopy March 2021 negative with the exception of sigmoid  diverticulosis  #3 status post intracranial hemorrhage #4.  Status post appendectomy #5.  Hypertension #6 altered mental status with agitation-etiology not clear    PLAN: #1 Patient is being transfused 2 units of packed RBCs, then will need serial hemoglobins every 6 to 8 hours and transfusion as indicated # 2.  PPI infusion has been started #3 keep n.p.o. #4 patient will be scheduled for EGD and enteroscopy with Dr. Hilarie Fredrickson this afternoon.  Procedure was discussed in detail with the patient today who voices understanding.  Further recommendations pending findings at endoscopic evaluation.    Cantrell Larouche EsterwoodPA-C  08/10/2020, 9:59 AM

## 2020-08-10 NOTE — Anesthesia Procedure Notes (Signed)
Procedure Name: MAC Date/Time: 08/10/2020 3:44 PM Performed by: Imagene Riches, CRNA Pre-anesthesia Checklist: Patient identified, Emergency Drugs available, Suction available, Patient being monitored and Timeout performed Patient Re-evaluated:Patient Re-evaluated prior to induction Oxygen Delivery Method: Nasal cannula

## 2020-08-10 NOTE — ED Provider Notes (Signed)
[facility] EMERGENCY DEPARTMENT Provider Note   CSN: [id number] Arrival date & time: 08/09/20  2339     History Chief Complaint  Patient presents with  . Extremity Weakness    Daniel [name] is a 60 y.o. male.  Patient with history of ESRD-HD, DM, HTN, previous GI bleed (2021) presents for further evaluation of left arm pain. Seen yesterday in this ED after pain and swelling developed after attempt at dialysis treatment. He was seen by vascular, Dr. Stanford Breed, who determined he would need placement of a dialysis catheter in the outpatient setting 08/10/20 in his office. At that visit, the patient left without being discharged and without receiving instructions on the planned procedure. He returns tonight for control of left arm pain. On chart review, the patient's hemoglobin was noted to be below his baseline with a significant down trending over the last 2 weeks. On further questioning, he reports his stool has been black, "messed up". The patient denied history of GI bleed in the past. He denies abdominal pain, nausea, vomiting, dizziness.    Extremity Weakness Pertinent negatives include no abdominal pain.       Past Medical History:  Diagnosis Date  . Anemia   . Diabetes mellitus without complication ([place])    patient denies  . Dialysis patient ([place])   . End stage chronic kidney disease ([place])   . Hypertension   . ICH (intracerebral hemorrhage) ([place]) 05/20/2017  . Shoulder pain, left 06/28/2013    Patient Active Problem List   Diagnosis Date Noted  . Acute GI bleeding 08/10/2020  . ESRD (end stage renal disease) ([place])   . AVM (arteriovenous malformation) of small bowel, acquired with hemorrhage   . Symptomatic anemia 07/31/2019  . Acute on chronic anemia 07/23/2019  . Chronic kidney disease, stage V ([place])   . Chronic hepatitis C without hepatic coma ([place]) 07/11/2019  . Iron deficiency anemia 03/30/2019  . CKD (chronic kidney disease), stage V ([place])  03/30/2019  . Alcohol use disorder 03/30/2019  . B12 deficiency 03/30/2019  . Orthostatic hypotension 01/22/2019  . Anemia   . Hypertension 06/29/2016  . DM2 (diabetes mellitus, type 2) ([place]) 06/29/2016    Past Surgical History:  Procedure Laterality Date  . APPENDECTOMY    . AV FISTULA PLACEMENT Left 08/03/2019   Procedure: LEFT ARM ARTERIOVENOUS (AV) CEPHALIC  FISTULA CREATION;  Surgeon: Waynetta Sandy, MD;  Location: [place];  Service: Vascular;  Laterality: Left;  . BIOPSY  06/30/2019   Procedure: BIOPSY;  Surgeon: Ronald Lobo, MD;  Location: [place];  Service: Endoscopy;;  . BIOPSY  08/02/2019   Procedure: BIOPSY;  Surgeon: Yetta Flock, MD;  Location: [place];  Service: Gastroenterology;;  . COLONOSCOPY  01/23/2012   Procedure: COLONOSCOPY;  Surgeon: Danie Binder, MD;  [facility];  Service: Endoscopy;  Laterality: N/A;  11:10 AM  . COLONOSCOPY WITH PROPOFOL N/A 06/30/2019   Procedure: COLONOSCOPY WITH PROPOFOL;  Surgeon: Ronald Lobo, MD;  Location: [place];  Service: Endoscopy;  Laterality: N/A;  . ENTEROSCOPY N/A 08/02/2019   Procedure: ENTEROSCOPY;  Surgeon: Yetta Flock, MD;  Location: [facility] ENDOSCOPY;  Service: Gastroenterology;  Laterality: N/A;  . ESOPHAGOGASTRODUODENOSCOPY (EGD) WITH PROPOFOL N/A 06/30/2019   Procedure: ESOPHAGOGASTRODUODENOSCOPY (EGD) WITH PROPOFOL;  Surgeon: Ronald Lobo, MD;  Location: [place];  Service: Endoscopy;  Laterality: N/A;  . FISTULA SUPERFICIALIZATION Left 10/17/2019   Procedure: LEFT UPPER EXTREMITY FISTULA REVISION, SIDE BRANCH LIGATION,  AND SUPERFICIALIZATION;  Surgeon:  Marty Heck, MD;  Location: [place];  Service: Vascular;  Laterality: Left;  . GIVENS CAPSULE STUDY N/A 06/30/2019   Procedure: GIVENS CAPSULE STUDY;  Surgeon: Ronald Lobo, MD;  Location: [facility] ENDOSCOPY;  Service: Endoscopy;  Laterality: N/A;  . HEMOSTASIS CONTROL  08/02/2019   Procedure: HEMOSTASIS CONTROL;   Surgeon: Yetta Flock, MD;  Location: [place] ENDOSCOPY;  Service: Gastroenterology;;  . INCISION AND DRAINAGE ABSCESS N/A 06/29/2016   Procedure: INCISION AND DRAINAGE ABDOMINAL WALL ABSCESS;  Surgeon: Alphonsa Overall, MD;  Location: [facility];  Service: General;  Laterality: N/A;  . INSERTION OF DIALYSIS CATHETER Right 08/03/2019   Procedure: INSERTION OF DIALYSIS CATHETER;  Surgeon: Waynetta Sandy, MD;  Location: [place];  Service: Vascular;  Laterality: Right;  . INSERTION OF DIALYSIS CATHETER Right 10/22/2019   Procedure: INSERTION OF 23CM TUNNELED DIALYSIS CATHETER RIGHT INTERNAL JUGULAR;  Surgeon: Angelia Mould, MD;  Location: [place];  Service: Vascular;  Laterality: Right;  . Left heel surgery    . PENILE BIOPSY N/A 03/26/2020   Procedure: PENILE ULCER DEBRIDEMENT;  Surgeon: Remi Haggard, MD;  Location: [facility];  Service: Urology;  Laterality: N/A;  30 MINS       Family History  Problem Relation Age of Onset  . Colon cancer Neg Hx     Social History   Tobacco Use  . Smoking status: Current Every Day Smoker    Packs/day: 1.00    Years: 30.00    Pack years: 30.00    Types: Cigarettes  . Smokeless tobacco: Never Used  Vaping Use  . Vaping Use: Never used  Substance Use Topics  . Alcohol use: Not Currently    Alcohol/week: 12.0 standard drinks    Types: 12 Cans of beer per week  . Drug use: Not Currently    Types: "Crack" cocaine    Comment: last in 2020    Home Medications Prior to Admission medications   Medication Sig Start Date End Date Taking? Authorizing Provider  amLODipine (NORVASC) 10 MG tablet Take 10 mg by mouth daily.    [provider]  blood glucose meter kit and supplies KIT Dispense based on patient and insurance preference. Use up to four times daily as directed. (FOR ICD-9 250.00, 250.01). 07/01/16   Nita Sells, MD  Calcium Acetate 667 MG TABS Take 667-1,334 mg by mouth See admin instructions. Take 667 mg in the morning  and afternoon and 1,334 in the evening 03/20/20   [provider]  cetirizine (ZYRTEC ALLERGY) 10 MG tablet Take 1 tablet (10 mg total) by mouth daily. Patient taking differently: Take 10 mg by mouth daily as needed for allergies. 02/23/20   [name], NP  cloNIDine (CATAPRES) 0.2 MG tablet Take  1 tablet twice daily . If makes drowsy take 2 tablets - after he is off of work Patient taking differently: Take 0.2 mg by mouth See admin instructions. If makes drowsy take 2 tablets - after he is off of work 07/13/19   [name], NP  gabapentin (NEURONTIN) 300 MG capsule Take 1 capsule (300 mg total) by mouth 3 (three) times daily. Take 313m twice daily and 6066mat bedtime. Patient taking differently: Take 300-600 mg by mouth See admin instructions. Take 30012mwice daily and 600m33m bedtime. 02/23/20   EdwaKerin Perna  hydrOXYzine (ATARAX/VISTARIL) 25 MG tablet Take 1 tablet (25 mg total) by mouth 2 (two) times daily as needed. Patient taking differently: Take 25 mg  by mouth 2 (two) times daily as needed for anxiety. 02/23/20   [name], NP  lidocaine-prilocaine (EMLA) cream Apply 1 application topically daily as needed (pain). 01/24/20   [provider]  oxyCODONE-acetaminophen (PERCOCET/ROXICET) 5-325 MG tablet Take 1 tablet by mouth every 6 (six) hours as needed for severe pain. 09/27/58   Delora Fuel, MD  colchicine 0.6 MG tablet Take 0.5 tablets (0.3 mg total) by mouth 2 (two) times daily. 07/24/20 07/29/20  Noemi Chapel, MD  ferrous sulfate 325 (65 FE) MG tablet SMARTSIG:1 Tablet(s) By Mouth Twice Daily 08/11/19 02/09/20  [provider]  furosemide (LASIX) 40 MG tablet Take 1 tablet (40 mg total) by mouth daily. 07/25/19 02/09/20  Ladona Horns, MD    Allergies    Dilaudid [hydromorphone hcl]  Review of Systems   Review of Systems  Constitutional: Negative for chills and fever.  HENT: Negative.   Respiratory: Negative.    Cardiovascular: Negative.   Gastrointestinal: Negative.  Negative for abdominal pain.       Reports black color of stools  Musculoskeletal: Positive for extremity weakness.       Arm pain  Skin: Negative.     Physical Exam Updated Vital Signs BP (!) 181/60   Pulse 87   Temp 98.8 F (37.1 C) (Oral)   Resp 18   Ht _0  (1.803 m)   Wt 120.2 kg   SpO2 99%   BMI 36.96 kg/m   Physical Exam Vitals and nursing note reviewed.  Constitutional:      Appearance: He is obese.     Comments: Chronically ill appearing  HENT:     Head: Normocephalic.  Cardiovascular:     Rate and Rhythm: Normal rate and regular rhythm.  Pulmonary:     Effort: Pulmonary effort is normal.     Breath sounds: Rales present. No wheezing or rhonchi.  Abdominal:     Palpations: Abdomen is soft.     Tenderness: There is no abdominal tenderness.  Musculoskeletal:        General: Normal range of motion.     Cervical back: Normal range of motion and neck supple.     Comments: Left upper extremity is moderately swollen and diffusely tender, with significant tenderness over AV graft in upper arm. There is a palpable thrill above and below graft. No bleeding. Vascularly intact.   Skin:    General: Skin is warm and dry.  Neurological:     Mental Status: He is alert and oriented to person, place, and time.     ED Results / Procedures / Treatments   Labs (all labs ordered are listed, but only abnormal results are displayed) Labs Reviewed  POC OCCULT BLOOD, ED - Abnormal; Notable for the following components:      Result Value   Fecal Occult Bld POSITIVE (*)    All other components within normal limits  I-STAT CHEM 8, ED - Abnormal; Notable for the following components:   BUN 101 (*)    Creatinine, Ser 11.50 (*)    Calcium, Ion 1.01 (*)    TCO2 21 (*)    Hemoglobin 8.2 (*)    HCT 24.0 (*)    All other components within normal limits  RESP PANEL BY RT-PCR (FLU A&B, COVID) ARPGX2  APTT  TYPE AND  SCREEN    EKG None  Radiology No results found.  Procedures Procedures   Medications Ordered in ED Medications  camphor-menthol (SARNA) lotion (has no administration in time range)  oxyCODONE-acetaminophen (PERCOCET/ROXICET) 5-325 MG per tablet 1 tablet (1 tablet Oral Given 08/10/20 0010)    ED Course  I have reviewed the triage vital signs and the nursing notes.  Pertinent labs & imaging results that were available during my care of the patient were reviewed by me and considered in my medical decision making (see chart for details).    MDM Rules/Calculators/A&P                          Patient to ED for arm pain, found to be anemic below baseline, reporting melena. He is found to be guaiac positive.   On chart review, he has been seen by [facility] as inpatient, as well as [facility] as inpatient. Does not appear to have followed up in office with either but unable to confirm. Will consult unassigned GI and admit to [facility].   Discussed with Dr. Hal Hope, [facility], who accepts for admission.   Secure chat message to Dr. Stanford Breed informing that the patient has been admitted, as he was planning outpatient procedure later today.   Secure chat sent to [facility] for consult for recurrent GI bleeding in the am. [place] Plan)  Final Clinical Impression(s) / ED Diagnoses Final diagnoses:  None   1. GI bleed  Rx / DC Orders ED Discharge Orders    None       Charlann Lange, Hershal Coria 08/10/20 0506    Ripley Fraise, MD 08/10/20 [phone]

## 2020-08-10 NOTE — Anesthesia Preprocedure Evaluation (Addendum)
Anesthesia Evaluation  Patient identified by MRN, date of birth, ID band  Reviewed: Allergy & Precautions, Patient's Chart, lab work & pertinent test results  History of Anesthesia Complications Negative for: history of anesthetic complications  Airway Mallampati: III  TM Distance: >3 FB     Dental  (+) Poor Dentition,    Pulmonary Current Smoker,    breath sounds clear to auscultation       Cardiovascular hypertension, Pt. on medications  Rhythm:Regular Rate:Normal   '20 TTE - EF 55 to 60%. There is mildly increased left ventricular hypertrophy. Left atrial size was mildly dilated. Mild MR.     Neuro/Psych  Hx ICH 2019  negative psych ROS   GI/Hepatic negative GI ROS, (+)     substance abuse  alcohol use, Hepatitis -, C  Endo/Other  diabetes Obesity   Renal/GU ESRF and DialysisRenal disease     Musculoskeletal negative musculoskeletal ROS (+)   Abdominal (+) + obese,   Peds  Hematology  (+) anemia ,  Thrombocytopenia, Plt 128k    Anesthesia Other Findings   Reproductive/Obstetrics                            Anesthesia Physical Anesthesia Plan  ASA: III  Anesthesia Plan: MAC   Post-op Pain Management:    Induction: Intravenous  PONV Risk Score and Plan: 1 and Propofol infusion and Treatment may vary due to age or medical condition  Airway Management Planned: Nasal Cannula and Natural Airway  Additional Equipment: None  Intra-op Plan:   Post-operative Plan:   Informed Consent: I have reviewed the patients History and Physical, chart, labs and discussed the procedure including the risks, benefits and alternatives for the proposed anesthesia with the patient or authorized representative who has indicated his/her understanding and acceptance.       Plan Discussed with: CRNA and Anesthesiologist  Anesthesia Plan Comments:        Anesthesia Quick Evaluation

## 2020-08-10 NOTE — Anesthesia Postprocedure Evaluation (Signed)
Anesthesia Post Note  Patient: Daniel [name]  Procedure(s) Performed: ENTEROSCOPY (N/A ) ESOPHAGOGASTRODUODENOSCOPY (EGD) (N/A ) HEMOSTASIS CLIP PLACEMENT     Patient location during evaluation: Endoscopy Anesthesia Type: MAC Level of consciousness: awake and alert Pain management: pain level controlled Vital Signs Assessment: post-procedure vital signs reviewed and stable Respiratory status: spontaneous breathing, nonlabored ventilation, respiratory function stable and patient connected to nasal cannula oxygen Cardiovascular status: stable and blood pressure returned to baseline Postop Assessment: no apparent nausea or vomiting Anesthetic complications: no   No complications documented.  Last Vitals:  Vitals:   08/10/20 1629 08/10/20 1719  BP: (!) 133/52 (!) 171/61  Pulse: 93 87  Resp: 19 19  Temp:  36.8 C  SpO2: 100% 100%    Last Pain:  Vitals:   08/10/20 1629  TempSrc:   PainSc: 0-No pain   Pain Goal:                   Caramia Boutin COKER

## 2020-08-10 NOTE — Progress Notes (Signed)
Paged Dr Donzetta Matters per Dr Lupita Leash request that patient back to floor.

## 2020-08-10 NOTE — Progress Notes (Signed)
   Tunneled dialysis catheter placement was canceled by anesthesia over concern of GI bleed.  If patient needs urgent dialysis he will need a temporary catheter.  We will place tunneled dialysis catheter when patient is medically stable.  Hewitt Garner C. Donzetta Matters, MD [facility] Office: [phone] Pager: [phone]

## 2020-08-10 NOTE — H&P (Signed)
History and Physical    Daniel [name] SEG:[id number] DOB: May 20, 1960 DOA: 08/09/2020  PCP: [name], NP  Patient coming from: Home.  Chief Complaint: Left upper extremity pain.  HPI: Daniel [name] is a 60 y.o. male with history of ESRD on hemodialysis on Tuesday Thursdays and Saturday presents to the ER for the second time in the last 24 hours with increasing pain in the left upper extremity.  Patient was seen earlier today by vascular surgeon in the ER after patient complained of increasing pain after dialysis.  Patient was planned to have outpatient tunneled catheter placed since patient's left AV fistula had developed a hematoma due to traumatic access.  Patient comes again because of worsening pain.  On further questioning patient also states over the last 3 days he has been having black stools.  Patient also had some rash in the lower extremity.  Denies taking any NSAIDs.  And not on the aspirin.  ED Course: In the ER patient was having significant pain in the left upper extremity but pulses are palpable.  Stool for occult blood was positive.  Hemoglobin was around 8.2 earlier today to around 7.1.  Patient's baseline hemoglobin is around 9.  Covid test was negative.  Patient admitted for upper GI bleed and also will need access for dialysis.  Review of Systems: As per HPI, rest all negative.   Past Medical History:  Diagnosis Date  . Anemia   . Diabetes mellitus without complication ([place])    patient denies  . Dialysis patient ([place])   . End stage chronic kidney disease ([place])   . Hypertension   . ICH (intracerebral hemorrhage) ([place]) 05/20/2017  . Shoulder pain, left 06/28/2013    Past Surgical History:  Procedure Laterality Date  . APPENDECTOMY    . AV FISTULA PLACEMENT Left 08/03/2019   Procedure: LEFT ARM ARTERIOVENOUS (AV) CEPHALIC  FISTULA CREATION;  Surgeon: Waynetta Sandy, MD;  Location: [place];  Service: Vascular;  Laterality: Left;  . BIOPSY   06/30/2019   Procedure: BIOPSY;  Surgeon: Ronald Lobo, MD;  Location: [place];  Service: Endoscopy;;  . BIOPSY  08/02/2019   Procedure: BIOPSY;  Surgeon: Yetta Flock, MD;  Location: [place];  Service: Gastroenterology;;  . COLONOSCOPY  01/23/2012   Procedure: COLONOSCOPY;  Surgeon: Danie Binder, MD;  [facility];  Service: Endoscopy;  Laterality: N/A;  11:10 AM  . COLONOSCOPY WITH PROPOFOL N/A 06/30/2019   Procedure: COLONOSCOPY WITH PROPOFOL;  Surgeon: Ronald Lobo, MD;  Location: [place];  Service: Endoscopy;  Laterality: N/A;  . ENTEROSCOPY N/A 08/02/2019   Procedure: ENTEROSCOPY;  Surgeon: Yetta Flock, MD;  Location: [facility] ENDOSCOPY;  Service: Gastroenterology;  Laterality: N/A;  . ESOPHAGOGASTRODUODENOSCOPY (EGD) WITH PROPOFOL N/A 06/30/2019   Procedure: ESOPHAGOGASTRODUODENOSCOPY (EGD) WITH PROPOFOL;  Surgeon: Ronald Lobo, MD;  Location: [place];  Service: Endoscopy;  Laterality: N/A;  . FISTULA SUPERFICIALIZATION Left 10/17/2019   Procedure: LEFT UPPER EXTREMITY FISTULA REVISION, SIDE BRANCH LIGATION,  AND SUPERFICIALIZATION;  Surgeon: Marty Heck, MD;  Location: [place];  Service: Vascular;  Laterality: Left;  . GIVENS CAPSULE STUDY N/A 06/30/2019   Procedure: GIVENS CAPSULE STUDY;  Surgeon: Ronald Lobo, MD;  Location[facility] END[facility]COPY;  Service: Endoscopy;  Laterality: N/A;  . HEMOSTASIS CONTROL  08/02/2019   Procedure: HEMOSTASIS CONTROL;  Surgeon: Yetta Flock, MD;  Location: [place];  Service: Gastroenterology;;  . INCISION AND DRAINAGE ABSCESS N/A 06/29/2016   Procedure: INCISION AND  DRAINAGE ABDOMINAL WALL ABSCESS;  Surgeon: Alphonsa Overall, MD;  Location: [facility];  Service: General;  Laterality: N/A;  . INSERTION OF DIALYSIS CATHETER Right 08/03/2019   Procedure: INSERTION OF DIALYSIS CATHETER;  Surgeon: Waynetta Sandy, MD;  Location: [place];  Service: Vascular;  Laterality: Right;  . INSERTION OF DIALYSIS CATHETER  Right 10/22/2019   Procedure: INSERTION OF 23CM TUNNELED DIALYSIS CATHETER RIGHT INTERNAL JUGULAR;  Surgeon: Angelia Mould, MD;  Location: [place];  Service: Vascular;  Laterality: Right;  . Left heel surgery    . PENILE BIOPSY N/A 03/26/2020   Procedure: PENILE ULCER DEBRIDEMENT;  Surgeon: Remi Haggard, MD;  Location: [facility];  Service: Urology;  Laterality: N/A;  30 MINS     reports that he has been smoking cigarettes. He has a 30.00 pack-year smoking history. He has never used smokeless tobacco. He reports previous alcohol use of about 12.0 standard drinks of alcohol per week. He reports previous drug use. Drug: "Crack" cocaine.  Allergies  Allergen Reactions  . Dilaudid [Hydromorphone Hcl] Itching    Pt reports itchiness after IM injection     Family History  Problem Relation Age of Onset  . Colon cancer Neg Hx     Prior to Admission medications   Medication Sig Start Date End Date Taking? Authorizing Provider  amLODipine (NORVASC) 10 MG tablet Take 10 mg by mouth daily.    [provider]  blood glucose meter kit and supplies KIT Dispense based on patient and insurance preference. Use up to four times daily as directed. (FOR ICD-9 250.00, 250.01). 07/01/16   Nita Sells, MD  Calcium Acetate 667 MG TABS Take 667-1,334 mg by mouth See admin instructions. Take 667 mg in the morning and afternoon and 1,334 in the evening 03/20/20   [provider]  cetirizine (ZYRTEC ALLERGY) 10 MG tablet Take 1 tablet (10 mg total) by mouth daily. Patient taking differently: Take 10 mg by mouth daily as needed for allergies. 02/23/20   [name], NP  cloNIDine (CATAPRES) 0.2 MG tablet Take  1 tablet twice daily . If makes drowsy take 2 tablets - after he is off of work Patient taking differently: Take 0.2 mg by mouth See admin instructions. If makes drowsy take 2 tablets - after he is off of work 07/13/19   [name], NP  gabapentin (NEURONTIN) 300  MG capsule Take 1 capsule (300 mg total) by mouth 3 (three) times daily. Take 313m twice daily and 6067mat bedtime. Patient taking differently: Take 300-600 mg by mouth See admin instructions. Take 30038mwice daily and 600m32m bedtime. 02/23/20   EdwaKerin Perna  hydrOXYzine (ATARAX/VISTARIL) 25 MG tablet Take 1 tablet (25 mg total) by mouth 2 (two) times daily as needed. Patient taking differently: Take 25 mg by mouth 2 (two) times daily as needed for anxiety. 02/23/20   EdwaKerin Perna  lidocaine-prilocaine (EMLA) cream Apply 1 application topically daily as needed (pain). 01/24/20   [provider]  oxyCODONE-acetaminophen (PERCOCET/ROXICET) 5-325 MG tablet Take 1 tablet by mouth every 6 (six) hours as needed for severe pain. 4/9/0/9/32licDelora Fuel  colchicine 0.6 MG tablet Take 0.5 tablets (0.3 mg total) by mouth 2 (two) times daily. 07/24/20 07/29/20  MillNoemi Chapel  ferrous sulfate 325 (65 FE) MG tablet SMARTSIG:1 Tablet(s) By Mouth Twice Daily 08/11/19 02/09/20  [provider]  furosemide (LASIX) 40 MG tablet Take 1 tablet (40 mg total)  by mouth daily. 07/25/19 02/09/20  Ladona Horns, MD    Physical Exam: Constitutional: Moderately built and nourished. Vitals:   08/10/20 0215 08/10/20 0230 08/10/20 0245 08/10/20 0300  BP: (!) 144/55 (!) 132/54 (!) 163/69 (!) 181/60  Pulse: 92 91 92 87  Resp: 15 16 (!) 29 18  Temp:      TempSrc:      SpO2: 97% 99% 100% 99%  Weight:      Height:       Eyes: Anicteric no pallor. ENMT: No discharge from the ears eyes nose or mouth. Neck: No mass felt.  No neck rigidity. Respiratory: No rhonchi or crepitations. Cardiovascular: S1-S2 heard. Abdomen: Soft nontender bowel sounds present. Musculoskeletal: Left upper extremity is mildly edematous around the AV fistula area.  Pulses are palpable. Skin: Rash in the lower extremity. Neurologic: Alert awake oriented to his name and place moving all  extremities. Psychiatric: Appears normal.  Normal affect.   Labs on Admission: I have personally reviewed following labs and imaging studies  CBC: Recent Labs  Lab 08/04/20 0119 08/09/20 1440 08/10/20 0117  WBC 7.2 5.8  --   NEUTROABS  --  3.4  --   HGB 8.7* 7.1* 8.2*  HCT 27.2* 22.8* 24.0*  MCV 107.1* 111.8*  --   PLT 149* 169  --    Basic Metabolic Panel: Recent Labs  Lab 08/04/20 0119 08/09/20 1537 08/10/20 0117  NA 132* 138 136  K 5.0 4.7 4.4  CL 90* 100 101  CO2 25 21*  --   GLUCOSE 112* 104* 91  BUN 87* 79* 101*  CREATININE 11.50* 10.51* 11.50*  CALCIUM 8.4* 8.2*  --    GFR: Estimated Creatinine Clearance: 9 mL/min (A) (by C-G formula based on SCr of 11.5 mg/dL (H)). Liver Function Tests: No results for input(s): AST, ALT, ALKPHOS, BILITOT, PROT, ALBUMIN in the last 168 hours. No results for input(s): LIPASE, AMYLASE in the last 168 hours. No results for input(s): AMMONIA in the last 168 hours. Coagulation Profile: Recent Labs  Lab 08/09/20 1524  INR 1.1   Cardiac Enzymes: No results for input(s): CKTOTAL, CKMB, CKMBINDEX, TROPONINI in the last 168 hours. BNP (last 3 results) No results for input(s): PROBNP in the last 8760 hours. HbA1C: No results for input(s): HGBA1C in the last 72 hours. CBG: No results for input(s): GLUCAP in the last 168 hours. Lipid Profile: No results for input(s): CHOL, HDL, LDLCALC, TRIG, CHOLHDL, LDLDIRECT in the last 72 hours. Thyroid Function Tests: No results for input(s): TSH, T4TOTAL, FREET4, T3FREE, THYROIDAB in the last 72 hours. Anemia Panel: No results for input(s): VITAMINB12, FOLATE, FERRITIN, TIBC, IRON, RETICCTPCT in the last 72 hours. Urine analysis:    Component Value Date/Time   COLORURINE YELLOW 07/31/2019 2128   APPEARANCEUR CLEAR 07/31/2019 2128   LABSPEC 1.009 07/31/2019 2128   PHURINE 5.0 07/31/2019 2128   GLUCOSEU NEGATIVE 07/31/2019 2128   HGBUR SMALL (A) 07/31/2019 2128   BILIRUBINUR NEGATIVE  07/31/2019 2128   KETONESUR NEGATIVE 07/31/2019 2128   PROTEINUR 100 (A) 07/31/2019 2128   UROBILINOGEN 0.2 12/31/2009 0746   NITRITE NEGATIVE 07/31/2019 2128   LEUKOCYTESUR NEGATIVE 07/31/2019 2128   Sepsis Labs: @LABRCNTIP (procalcitonin:4,lacticidven:4) ) Recent Results (from the past 240 hour(s))  Resp Panel by RT-PCR (Flu A&B, Covid) Nasopharyngeal Swab     Status: None   Collection Time: 08/10/20  2:58 AM   Specimen: Nasopharyngeal Swab; Nasopharyngeal(NP) swabs in vial transport medium  Result Value Ref Range Status   SARS  Coronavirus 2 by RT PCR NEGATIVE NEGATIVE Final    Comment: (NOTE) SARS-CoV-2 target nucleic acids are NOT DETECTED.  The SARS-CoV-2 RNA is generally detectable in upper respiratory specimens during the acute phase of infection. The lowest concentration of SARS-CoV-2 viral copies this assay can detect is 138 copies/mL. A negative result does not preclude SARS-Cov-2 infection and should not be used as the sole basis for treatment or other patient management decisions. A negative result may occur with  improper specimen collection/handling, submission of specimen other than nasopharyngeal swab, presence of viral mutation(s) within the areas targeted by this assay, and inadequate number of viral copies(<138 copies/mL). A negative result must be combined with clinical observations, patient history, and epidemiological information. The expected result is Negative.  Fact Sheet for Patients:  EntrepreneurPulse.com.au  Fact Sheet for Healthcare Providers:  IncredibleEmployment.be  This test is no t yet approved or cleared by the [place] FDA and  has been authorized for detection and/or diagnosis of SARS-CoV-2 by FDA under an Emergency Use Authorization (EUA). This EUA will remain  in effect (meaning this test can be used) for the duration of the COVID-19 declaration under Section 564(b)(1) of the Act,  21 U.S.C.section 360bbb-3(b)(1), unless the authorization is terminated  or revoked sooner.       Influenza A by PCR NEGATIVE NEGATIVE Final   Influenza B by PCR NEGATIVE NEGATIVE Final    Comment: (NOTE) The Xpert Xpress SARS-CoV-2/FLU/RSV plus assay is intended as an aid in the diagnosis of influenza from Nasopharyngeal swab specimens and should not be used as a sole basis for treatment. Nasal washings and aspirates are unacceptable for Xpert Xpress SARS-CoV-2/FLU/RSV testing.  Fact Sheet for Patients: EntrepreneurPulse.com.au  Fact Sheet for Healthcare Providers: IncredibleEmployment.be  This test is not yet approved or cleared by the [place] FDA and has been authorized for detection and/or diagnosis of SARS-CoV-2 by FDA under an Emergency Use Authorization (EUA). This EUA will remain in effect (meaning this test can be used) for the duration of the COVID-19 declaration under Section 564(b)(1) of the Act, 21 U.S.C. section 360bbb-3(b)(1), unless the authorization is terminated or revoked.  Performed at [facility], [place] [address] [place], [place] [address]      Radiological Exams on Admission: No results found.   Assessment/Plan Principal Problem:   Acute GI bleeding Active Problems:   Hypertension   DM2 (diabetes mellitus, type 2) ([place])   Anemia   ESRD (end stage renal disease) ([place])    1. Acute GI bleeding -per patient patient stools are melanotic.  Patient has underwent work-up for symptomatic anemia in March 2021 by RN patient had an EGD colonoscopy and capsule endoscopy which all did not show any source of bleeding.  We will keep patient on Protonix IV consult GI.  Check serial CBCs. 2. Hematoma of the left AV fistula secondary to traumatic access.  Plan was to have outpatient dialysis catheter placed.  Will consult vascular surgery. 3. ESRD on hemodialysis on Tuesday Thursday Saturday.  Consult nephrology for  dialysis. 4. Hypertension uncontrolled we will keep patient on as needed IV hydralazine in addition to home medication clonidine and amlodipine. 5. Diabetes mellitus type 2 presently not on medication we will keep patient on sliding scale coverage. 6. Acute blood loss anemia follow CBC. 7. Rash in the lower extremities not clear cause.  We will continue to observe.   DVT prophylaxis: SCDs.  Avoiding anticoagulation in the setting of GI bleed. Code Status: Full code.  Family Communication: Discussed with patient. Disposition Plan: Home. Consults called: We will consult vascular surgery and GI and nephrology. Admission status: Observation.   Rise Patience MD [facility] Pager [phone].  If 7PM-7AM, please contact night-coverage [email or web address] Password [facility]  08/10/2020, 4:07 AM

## 2020-08-10 NOTE — Progress Notes (Signed)
Patient seen and examined personally, I reviewed the chart, history and physical and admission note, done by admitting physician this morning and agree with the same with following addendum.  Please refer to the morning admission note for more detailed plan of care.  Briefly,  Pt seen in [place] holding area, he is waiting for  Hd access placement repots last HD Tuesday, moaning, c/o pain on legs, arms.  no nausea or vomiting or obvious abdomen pain On RA saturating well, some shortness of breath may be? he says, He is anxious/mildly agitated and appears uncomfortable.  I spoke w/ nephro and GI  Issues Acute blood loss anemia Melanotic stool concerned for acute gi bleeding: Hb in 5.4 gm- getting 1st unit, ordered 2nd unit too after discussing with nephro. Patient is agreeable for transfusion. Will order lasix 40 mg iv, plan for HD today. GI is already consulted. Will order CT Abd/Pelvis w/o-which came back and reviewed no acute finding. BP on higher side. I alerted GI for low Hb to see if we need to do anything urgently.  I have ordered bolus and infusion of Protonix.  Patient again had melanoticstool after coming to the floor Recent Labs  Lab 08/04/20 0119 08/09/20 1440 08/10/20 0117 08/10/20 0403 08/10/20 0845  HGB 8.7* 7.1* 8.2* 7.5* 5.8*  5.4*  HCT 27.2* 22.8* 24.0* 24.2* 18.9*  16.0*   ESRD HD TTS/Uremia bun in 112:-planning for dialysis today will likely need temporary catheter nephrology/vascular aware. last HD Tuesday and had 1 hr HD yesterday. ?short ness of breath, looks some fluid loverloaded. potassium and bicarb stable Wt Readings from Last 3 Encounters:  08/10/20 120.2 kg  08/04/20 120.2 kg  07/29/20 118.4 kg   Other issues below refer to HPI Left AVF hematoma/HD access problem. Uncontrolled HTN T2DM Rash on LE ?hx of Etoh use- monitor.  Contacted the listed number for his brother unable to reach.

## 2020-08-10 NOTE — ED Notes (Signed)
Patient assisted back to bed after using bedside commode.

## 2020-08-10 NOTE — Interval H&P Note (Signed)
History and Physical Interval Note:  08/10/2020 9:41 AM  Daniel [name]  has presented today for surgery, with the diagnosis of ESRD.  The various methods of treatment have been discussed with the patient and family. After consideration of risks, benefits and other options for treatment, the patient has consented to  Procedure(s): INSERTION OF TUNNELLED DIALYSIS CATHETER (N/A) as a surgical intervention.  The patient's history has been reviewed, patient examined, no change in status, stable for surgery.  I have reviewed the patient's chart and labs.  Questions were answered to the patient's satisfaction.    Patient with acute on chronic anemia today.  We are going to transfuse 1 unit and place tunneled dialysis catheter.  I discussed the risk and benefits as well as alternatives and he demonstrates good understanding.  He states that his last dialysis was Tuesday.   Servando Snare, MD

## 2020-08-10 NOTE — ED Notes (Signed)
Patient signed consent form for sedation .

## 2020-08-10 NOTE — Progress Notes (Signed)
Dr. Lissa Hoard requesting patient return to ED for medical management of possible GI bleed due to patient's decrease in hemoglobin in the last 24 hours. Patient not going to surgery for tunneled dialysis catheter at this time. [place], Agricultural consultant, notified and stated to bring patient back to ED at this time. Patient transported to ED by RN.

## 2020-08-10 NOTE — ED Notes (Addendum)
Dr Lupita Leash and OR RN notified of pt's I-stat hgb of 5.2.

## 2020-08-10 NOTE — Progress Notes (Signed)
NEW ADMISSION NOTE New Admission Note:   Arrival Method:  stretcher Mental Orientation: A&O 4 Telemetry: yes Assessment: Completed Skin: see assessment IV: Infusing blood left AC Pain: Left arm Fistula site Tubes: none  Safety Measures: Safety Fall Prevention Plan has been given, discussed and signed Admission: Completed [facility] Orientation: Patient has been orientated to the room, unit and staff.  Family:  Orders have been reviewed and implemented. Will continue to monitor the patient. Call light has been placed within reach and bed alarm has been activated.   Berneta Levins, RN

## 2020-08-10 NOTE — ED Notes (Signed)
Attempted report 

## 2020-08-10 NOTE — Plan of Care (Signed)
  Problem: Activity: Goal: Risk for activity intolerance will decrease Outcome: Not Progressing   

## 2020-08-10 NOTE — ED Notes (Signed)
Patient is getting blood and will get a second unit of blood.nurse inform  No blood sticks

## 2020-08-10 NOTE — Transfer of Care (Signed)
Immediate Anesthesia Transfer of Care Note  Patient: Daniel [name]  Procedure(s) Performed: ENTEROSCOPY (N/A ) ESOPHAGOGASTRODUODENOSCOPY (EGD) (N/A ) HEMOSTASIS CLIP PLACEMENT  Patient Location: Endoscopy Unit  Anesthesia Type:General  Level of Consciousness: drowsy  Airway & Oxygen Therapy: Patient Spontanous Breathing and Patient connected to nasal cannula oxygen  Post-op Assessment: Report given to RN and Post -op Vital signs reviewed and stable  Post vital signs: Reviewed and stable  Last Vitals:  Vitals Value Taken Time  BP 133/55 08/10/20 1610  Temp    Pulse 99 08/10/20 1611  Resp 15 08/10/20 1611  SpO2 100 % 08/10/20 1611  Vitals shown include unvalidated device data.  Last Pain:  Vitals:   08/10/20 1608  TempSrc:   PainSc: Asleep         Complications: No complications documented.

## 2020-08-10 NOTE — Op Note (Signed)
[facility] Patient Name: Daniel [name] Procedure Date : 08/10/2020 MRN: [id number] Attending MD: Jerene Bears , MD Date of Birth: 1960-10-20 CSN: [id number] Age: 60 Admit Type: Inpatient Procedure:                Upper GI endoscopy Indications:              Acute post hemorrhagic anemia, Melena Providers:                Lajuan Lines. Hilarie Fredrickson, MD, Erenest Rasher, RN, Fransico Setters                            [place], Technician Referring MD:             [facility] Medicines:                Monitored Anesthesia Care Complications:            No immediate complications. Estimated Blood Loss:     Estimated blood loss: none. Procedure:                Pre-Anesthesia Assessment:                           - Prior to the procedure, a History and Physical                            was performed, and patient medications and                            allergies were reviewed. The patient's tolerance of                            previous anesthesia was also reviewed. The risks                            and benefits of the procedure and the sedation                            options and risks were discussed with the patient.                            All questions were answered, and informed consent                            was obtained. Prior Anticoagulants: The patient has                            taken no previous anticoagulant or antiplatelet                            agents. ASA Grade Assessment: IV - A patient with                            severe systemic disease that is a constant threat  to life. After reviewing the risks and benefits,                            the patient was deemed in satisfactory condition to                            undergo the procedure.                           After obtaining informed consent, the endoscope was                            passed under direct vision. Throughout the                             procedure, the patient's blood pressure, pulse, and                            oxygen saturations were monitored continuously. The                            PCF-H190DL ([id number] Olympus pediatric colonoscope                            was introduced through the mouth, and advanced to                            the second part of duodenum. The upper GI endoscopy                            was accomplished without difficulty. The patient                            tolerated the procedure well. Scope In: Scope Out: Findings:      The examined esophagus was normal.      Scattered mild inflammation characterized by congestion (edema),       erosions and erythema was found in the gastric body and in the gastric       antrum.      One actively bleeding cratered duodenal ulcer with a visible vessel was       found in the duodenal bulb. The lesion was 9 mm in largest dimension.       For hemostasis, three hemostatic clips were successfully placed. There       was no bleeding at the end of the maneuver.      Inflammation with nodularity and polypoid mucosa was found in the       duodenal bulb. This was not biopsied today in setting of active       bleeding. The 2nd portion of duodenum was not seen well today and the       ampulla was not visualized due to blood in the lumen. Impression:               - Normal esophagus.                           -  Gastritis.                           - Bleeding duodenal ulcer with a visible vessel.                            Clips were placed x 3 with apparent hemostasis.                           - Duodenitis with nodularity and polypoid mucosa.                           - No specimens collected. Moderate Sedation:      N/A Recommendation:           - Return patient to hospital ward for ongoing care.                           - Full liquid diet. Advance as tolerated assuming                            Hgb stable/improving.                           -  Continue present medications.                           - Continue PPI gtt for 24 more hours, and when                            clinically appropriate change to PO BID PPI x 12                            weeks and then daily thereafter. Check H. Pylori                            stool antigen.                           - Repeat upper endoscopy in 2-3 months to check                            healing, but also to follow-up on previously seen                            duodenal nodularity and polyps. 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 Outpatient GI follow-up is                            recommended with Dr. Havery Moros after discharge. Procedure  Code(s):        --- Professional ---                           [phone], Esophagogastroduodenoscopy, flexible,                            transoral; with control of bleeding, any method Diagnosis Code(s):        --- Professional ---                           K29.70, Gastritis, unspecified, without bleeding                           K26.4, Chronic or unspecified duodenal ulcer with                            hemorrhage                           K29.80, Duodenitis without bleeding                           D62, Acute posthemorrhagic anemia                           K92.1, Melena (includes Hematochezia) CPT copyright 2019 American Medical Association. All rights reserved. The codes documented in this report are preliminary and upon coder review may  be revised to meet current compliance requirements. Jerene Bears, MD 08/10/2020 4:21:24 PM This report has been signed electronically. Number of Addenda: 0

## 2020-08-10 NOTE — ED Notes (Signed)
Condom cath was put on pt

## 2020-08-10 NOTE — ED Provider Notes (Signed)
MSE was initiated and I personally evaluated the patient and placed orders (if any) at  12:01 AM on August 10, 2020.  Patient to ED for second time today for evaluation of left arm pain, swelling and numbness. He reports he went to dialysis today and it could not be completed secondary to an access problem.   Chart reviewed. He was seen by Dr. Standley Dakins today who plans outpatient catheter insertion tomorrow but patient left prior to getting instructions for same.   Percocet provided for pain.  Labs reviewed. Hgb 7.1 with a steady decline noted on recent studies. He denies transfusion history. Reports his stool has been "messed up" recently, describing black color. No nausea, vomiting. No history of GI bleed.   Today's Vitals   08/09/20 2356 08/10/20 0000 08/10/20 0001  BP: (!) 160/91    Pulse: (!) 102    Resp: 20    Temp: 98.8 F (37.1 C)    TempSrc: Oral    SpO2: 98%    Weight:   120.2 kg  Height:   5\' 11"  (1.803 m)  PainSc:  10-Worst pain ever    Body mass index is 36.96 kg/m.  Patient is moaning with left arm pain Left arm is limited in range secondary to pain Moderately swollen Significantly tender over entire arm.    The patient appears stable so that the remainder of the MSE may be completed by another provider.   Charlann Lange, PA-C 08/10/20 Shellee Milo, MD 08/10/20 [phone]

## 2020-08-10 NOTE — Anesthesia Preprocedure Evaluation (Deleted)
Anesthesia Evaluation    Airway        Dental   Pulmonary Current Smoker and Patient abstained from smoking.,           Cardiovascular hypertension,      Neuro/Psych    GI/Hepatic   Endo/Other  diabetes  Renal/GU      Musculoskeletal   Abdominal   Peds  Hematology   Anesthesia Other Findings   Reproductive/Obstetrics                            Anesthesia Physical Anesthesia Plan  ASA:   Anesthesia Plan:    Post-op Pain Management:    Induction:   PONV Risk Score and Plan:   Airway Management Planned:   Additional Equipment:   Intra-op Plan:   Post-operative Plan:   Informed Consent:   Plan Discussed with:   Anesthesia Plan Comments: (Hemoglobin down form 7.5 at ~0400 to 5.4 at ~0845. No workup that I can see for bleeding, and pt brought to preop without primary or consult service input. Delay until blood trasfusion and potential source of active bleed is ruled out. I explained that if Dr. Donzetta Matters feels this is an emergency we will proceed as such.  I evaluated the patient, his abdomen is distended and he is complaining of abdominal pain. I discussed with Antonieta Pert of [place] who is primary service. I stated I felt this patient should not be in preop for a procedure he has no immediate indication for. He agreed. He also stated he has ordered a CT for w/u of his GI bleed. I asked him to ensure he is medically optimized prior to recommending he proceed with tunneled dialysis catheter placement.   I also spoke with the nephrologist, Candiss Norse, and he agreed that his source of bleeding is more pressing than his dialysis access.)      Anesthesia Quick Evaluation

## 2020-08-11 ENCOUNTER — Inpatient Hospital Stay (HOSPITAL_COMMUNITY): Payer: Medicare Other

## 2020-08-11 DIAGNOSIS — D62 Acute posthemorrhagic anemia: Secondary | ICD-10-CM | POA: Diagnosis present

## 2020-08-11 DIAGNOSIS — K922 Gastrointestinal hemorrhage, unspecified: Secondary | ICD-10-CM | POA: Diagnosis present

## 2020-08-11 DIAGNOSIS — K2980 Duodenitis without bleeding: Secondary | ICD-10-CM | POA: Diagnosis present

## 2020-08-11 DIAGNOSIS — I120 Hypertensive chronic kidney disease with stage 5 chronic kidney disease or end stage renal disease: Secondary | ICD-10-CM | POA: Diagnosis present

## 2020-08-11 DIAGNOSIS — Y712 Prosthetic and other implants, materials and accessory cardiovascular devices associated with adverse incidents: Secondary | ICD-10-CM | POA: Diagnosis present

## 2020-08-11 DIAGNOSIS — D631 Anemia in chronic kidney disease: Secondary | ICD-10-CM | POA: Diagnosis present

## 2020-08-11 DIAGNOSIS — E1151 Type 2 diabetes mellitus with diabetic peripheral angiopathy without gangrene: Secondary | ICD-10-CM | POA: Diagnosis present

## 2020-08-11 DIAGNOSIS — N186 End stage renal disease: Secondary | ICD-10-CM | POA: Diagnosis present

## 2020-08-11 DIAGNOSIS — Z6837 Body mass index (BMI) 37.0-37.9, adult: Secondary | ICD-10-CM | POA: Diagnosis not present

## 2020-08-11 DIAGNOSIS — E1122 Type 2 diabetes mellitus with diabetic chronic kidney disease: Secondary | ICD-10-CM | POA: Diagnosis present

## 2020-08-11 DIAGNOSIS — M10031 Idiopathic gout, right wrist: Secondary | ICD-10-CM | POA: Diagnosis not present

## 2020-08-11 DIAGNOSIS — B182 Chronic viral hepatitis C: Secondary | ICD-10-CM | POA: Diagnosis present

## 2020-08-11 DIAGNOSIS — Y832 Surgical operation with anastomosis, bypass or graft as the cause of abnormal reaction of the patient, or of later complication, without mention of misadventure at the time of the procedure: Secondary | ICD-10-CM | POA: Diagnosis present

## 2020-08-11 DIAGNOSIS — Z79899 Other long term (current) drug therapy: Secondary | ICD-10-CM | POA: Diagnosis not present

## 2020-08-11 DIAGNOSIS — T82510A Breakdown (mechanical) of surgically created arteriovenous fistula, initial encounter: Secondary | ICD-10-CM | POA: Diagnosis present

## 2020-08-11 DIAGNOSIS — L298 Other pruritus: Secondary | ICD-10-CM | POA: Diagnosis not present

## 2020-08-11 DIAGNOSIS — K2970 Gastritis, unspecified, without bleeding: Secondary | ICD-10-CM | POA: Diagnosis present

## 2020-08-11 DIAGNOSIS — K260 Acute duodenal ulcer with hemorrhage: Secondary | ICD-10-CM | POA: Diagnosis present

## 2020-08-11 DIAGNOSIS — L853 Xerosis cutis: Secondary | ICD-10-CM | POA: Diagnosis present

## 2020-08-11 DIAGNOSIS — Z885 Allergy status to narcotic agent status: Secondary | ICD-10-CM | POA: Diagnosis not present

## 2020-08-11 DIAGNOSIS — N2581 Secondary hyperparathyroidism of renal origin: Secondary | ICD-10-CM | POA: Diagnosis present

## 2020-08-11 DIAGNOSIS — E669 Obesity, unspecified: Secondary | ICD-10-CM | POA: Diagnosis present

## 2020-08-11 DIAGNOSIS — K264 Chronic or unspecified duodenal ulcer with hemorrhage: Secondary | ICD-10-CM | POA: Diagnosis not present

## 2020-08-11 DIAGNOSIS — Z20822 Contact with and (suspected) exposure to covid-19: Secondary | ICD-10-CM | POA: Diagnosis present

## 2020-08-11 DIAGNOSIS — F17210 Nicotine dependence, cigarettes, uncomplicated: Secondary | ICD-10-CM | POA: Diagnosis present

## 2020-08-11 DIAGNOSIS — T82858A Stenosis of vascular prosthetic devices, implants and grafts, initial encounter: Secondary | ICD-10-CM | POA: Diagnosis present

## 2020-08-11 DIAGNOSIS — Z992 Dependence on renal dialysis: Secondary | ICD-10-CM

## 2020-08-11 LAB — BASIC METABOLIC PANEL
Anion gap: 19 — ABNORMAL HIGH (ref 5–15)
BUN: 134 mg/dL — ABNORMAL HIGH (ref 6–20)
CO2: 18 mmol/L — ABNORMAL LOW (ref 22–32)
Calcium: 8.3 mg/dL — ABNORMAL LOW (ref 8.9–10.3)
Chloride: 102 mmol/L (ref 98–111)
Creatinine, Ser: 12.07 mg/dL — ABNORMAL HIGH (ref 0.61–1.24)
GFR, Estimated: 4 mL/min — ABNORMAL LOW (ref >60)
Glucose, Bld: 83 mg/dL (ref 70–99)
Potassium: 5.2 mmol/L — ABNORMAL HIGH (ref 3.5–5.1)
Sodium: 139 mmol/L (ref 135–145)

## 2020-08-11 LAB — CBC
HCT: 22.5 % — ABNORMAL LOW (ref 39.0–52.0)
Hemoglobin: 7.4 g/dL — ABNORMAL LOW (ref 13.0–17.0)
MCH: 32.6 pg (ref 26.0–34.0)
MCHC: 32.9 g/dL (ref 30.0–36.0)
MCV: 99.1 fL (ref 80.0–100.0)
Platelets: 120 10*3/uL — ABNORMAL LOW (ref 150–400)
RBC: 2.27 MIL/uL — ABNORMAL LOW (ref 4.22–5.81)
RDW: 22.5 % — ABNORMAL HIGH (ref 11.5–15.5)
WBC: 8 10*3/uL (ref 4.0–10.5)
nRBC: 0.4 % — ABNORMAL HIGH (ref 0.0–0.2)

## 2020-08-11 LAB — GLUCOSE, CAPILLARY
Glucose-Capillary: 103 mg/dL — ABNORMAL HIGH (ref 70–99)
Glucose-Capillary: 65 mg/dL — ABNORMAL LOW (ref 70–99)
Glucose-Capillary: 72 mg/dL (ref 70–99)
Glucose-Capillary: 84 mg/dL (ref 70–99)
Glucose-Capillary: 85 mg/dL (ref 70–99)
Glucose-Capillary: 90 mg/dL (ref 70–99)
Glucose-Capillary: 96 mg/dL (ref 70–99)
Glucose-Capillary: 99 mg/dL (ref 70–99)

## 2020-08-11 LAB — HEPATITIS B SURFACE ANTIGEN: Hepatitis B Surface Ag: NONREACTIVE

## 2020-08-11 MED ORDER — HYDROXYZINE HCL 25 MG PO TABS: 25.0000 mg | ORAL_TABLET | Freq: Three times a day (TID) | ORAL | Status: DC | PRN

## 2020-08-11 MED ORDER — SODIUM ZIRCONIUM CYCLOSILICATE 10 G PO PACK
10.0000 g | PACK | Freq: Once | ORAL | Status: DC
  Filled 2020-08-11: qty 1

## 2020-08-11 MED ORDER — CAPSAICIN 0.025 % EX CREA
TOPICAL_CREAM | Freq: Two times a day (BID) | CUTANEOUS | Status: DC
  Filled 2020-08-11: qty 60

## 2020-08-11 MED ORDER — GABAPENTIN 100 MG PO CAPS
100.0000 mg | ORAL_CAPSULE | Freq: Every day | ORAL | Status: DC
  Administered 2020-08-11 – 2020-08-15 (×5): 100 mg via ORAL
  Filled 2020-08-11 (×5): qty 1

## 2020-08-11 NOTE — Progress Notes (Signed)
Notified by HD RN that [name] c/o pain at L AVF site. Although cannulation was successful, patient still c/o pain. Notified Dr. Donzetta Matters. Patient's HD treatment was discontinued today (pulled off 527ml). Plan is for scheduled TDC placement on 08/12/20 at 7:30 AM. Spoke with HD Charge RN. On-call HD RN has been made aware of scheduled procedure.  Tobie Poet, NP

## 2020-08-11 NOTE — Progress Notes (Addendum)
PROGRESS NOTE    Daniel [name]  OBS:[id number] DOB: [birth year] DOA: 08/09/2020 PCP: [name], NP   Chief Complaint  Patient presents with  . 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  EGD showed actively bleeding 9 mm duodenal ulcer and 3 clips were placed.   Nephrology is following patient regarding dialysis.  On 08/03/2019, patient had a AV fistula placed in the left arm by Vascular Dr. Donzetta Matters that was complicated by a small traumatic hematoma formation.  Due to continued pain in the left arm, vascular will place dialysis catheter tomorrow in OR.  Subjective: Patient was evaluated prior to dialysis today. He has had no more episodes of bleeding. Denies any abdominal pain. Complains of pain at AVF site in left arm. Vascular will place catheter tomorrow   Assessment & Plan: Principal Problem:   Acute GI bleeding Active Problems:   Hypertension   DM2 (diabetes mellitus, type 2) (HCC)   Anemia   ESRD (end stage renal disease) ([place])   Gastrointestinal hemorrhage with melena   Duodenal ulcer hemorrhage  Acute Duodenal Ulcer s/p 4/15 endoscopic clip placement: - On Day 2 of IV Protonix 40 mg BID.  Transition to Protonix 40 mg PO BID tomorrow.  Continue x 30 days. - Hold blood thinners. - Follow up on H. Pylori results. - Follow up with [facility] outpatient for repeat EGD, appreciate.  ESRD on dialysis TTS with left brachiocephalic AVF in LUE with small hematoma formation:  Small traumatic hematoma was sustained on 08/09/20. - Vascular will place dialysis cathter tomorrow for access, appreciate. - Nephrology is following, appreciate.  Chronic Hypertension: - BP is stable.  Diabetes Mellitus Type 2: - Glucose is stable. - Continue Lispro SSi with POC glucose q6hrs.  History of Alcohol Use: - If does not  require Ativan x 24 hours, will discontinue CIWA protocol.  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  This does not look like bullous disease or PCT or NSF.  Calcific disease is a less likely possibility but given patient's history we will need to rule out. - Diagnosis will need to be confirmed with a skin biopsy. - Start Gabapentin 100 mg daily and capsaicin cream 0.03% to reduce eczema. - Start Atarax 25 mg PO TID PRN for pruritis as treatment of dermatosis.  Can consider Doxycycline if does not improve. - Given the patient's history of calciphylaxis, I will check PTH, Ca and Ph, Alk Ph, ESR and CRP, and plain Xray - although this is less likely as we would see painful lesions in a periarticular distribution with whitish discharge.  Based on results, will consider sodium thiosulfate as calcium chelator or cinalcet as calcimimetic. - There are no signs of cellulitis.  Check procalcitonin.  Hold off on antibiotics.  Uremic Pruritis: - Start Atarax 25 mg PO TID PRN.  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 - Monitor wounds.   Diet Order            Diet NPO time specified  Diet effective midnight           Diet full liquid Room service appropriate? Yes;  Fluid consistency: Thin  Diet effective now                       Patient's Body mass index is 37.54 kg/m.     DVT prophylaxis: SCDs Start: 08/10/20 0404 Code Status:   Code Status: Full Code  Family Communication: plan of care discussed with patient at bedside.  Status is: Inpatient  Remains inpatient appropriate because:Inpatient level of care appropriate due to severity of illness   Dispo: The patient is from: Home              Anticipated d/c is to: Home              Patient currently is not medically  stable to d/c.   Difficult to place patient No   Unresulted Labs (From admission, onward)          Start     Ordered   08/12/20 0500  CBC  Daily,   R      08/11/20 0825   08/11/20 1435  H. pylori antigen, stool  Once,   R        08/11/20 1434   08/11/20 1016  Hemoglobin and hematocrit, blood  Now then every 12 hours,   R (with TIMED occurrences)     Comments: Does not need the now order just start 12 hours from last hemoglobin draw, call MD for hemoglobin less than 7    08/11/20 1015   08/11/20 1601  Basic metabolic panel  Daily,   R     Question:  Specimen collection method  Answer:  Lab=Lab collect   08/10/20 1146           Medications reviewed:  Scheduled Meds: . sodium chloride   Intravenous Once  . amLODipine  10 mg Oral Daily  . calcium acetate  1,334 mg Oral Q supper  . calcium acetate  667 mg Oral BID WC  . Chlorhexidine Gluconate Cloth  6 each Topical Daily  . cloNIDine  0.2 mg Oral BID  . [START ON 08/14/2020] darbepoetin (ARANESP) injection - DIALYSIS  150 mcg Intravenous Q Tue-HD  . doxercalciferol  3 mcg Intravenous Q T,Th,Sa-HD  . furosemide  40 mg Intravenous Once  . insulin aspart  0-9 Units Subcutaneous Q4H  . [START ON 08/14/2020] pantoprazole  40 mg Intravenous Q12H  . sodium zirconium cyclosilicate  10 g Oral Once   Continuous Infusions: . sodium chloride 200 mL/hr at 08/10/20 1604  . pantoprozole (PROTONIX) infusion 8 mg/hr (08/11/20 1349)    Consultants:see note  Procedures:see note  Antimicrobials: Anti-infectives (From admission, onward)   Start     Dose/Rate Route Frequency Ordered Stop   08/10/20 1000  ceFAZolin (ANCEF) IVPB 2g/100 mL premix  Status:  Discontinued        2 g 200 mL/hr over 30 Minutes Intravenous To Short Stay 08/10/20 0716 08/10/20 1114     Culture/Microbiology    Component Value Date/Time   SDES BLOOD SITE NOT SPECIFIED 07/07/2020 1025   SPECREQUEST  07/07/2020 1025    BOTTLES DRAWN AEROBIC AND ANAEROBIC Blood  Culture adequate volume   CULT  07/07/2020 1025    NO GROWTH 5 DAYS Performed at [facility], [place] [address] [place], [place] [address]    REPTSTATUS 07/12/2020 FINAL 07/07/2020 1025    Other culture-see note  Objective: Vitals: Today's Vitals   08/11/20 1243 08/11/20 1331 08/11/20 1500 08/11/20 1702  BP: (!) 149/81 126/74  Pulse: 85 83    Resp: 16 18    Temp: 98.2 F (36.8 C) 97.9 F (36.6 C)    TempSrc: Oral     SpO2: 99% 96%    Weight: 122.1 kg     Height:      PainSc:   Asleep 10-Worst pain ever    Intake/Output Summary (Last 24 hours) at 08/11/2020 1717 Last data filed at 08/11/2020 1300 Gross per 24 hour  Intake 695 ml  Output 589 ml  Net 106 ml   Filed Weights   08/11/20 0500 08/11/20 1151 08/11/20 1243  Weight: 124.2 kg 124.2 kg 122.1 kg   Weight change: 0 kg  Intake/Output from previous day: 04/15 0701 - 04/16 0700 In: 025 [P.O.:100; I.V.:500; Blood:335] Out: 2 [Stool:2] Intake/Output this shift: Total I/O In: 595 [P.O.:595] Out: 589 [Other:589] Filed Weights   08/11/20 0500 08/11/20 1151 08/11/20 1243  Weight: 124.2 kg 124.2 kg 122.1 kg    Examination:  General exam: AAO ,NAD, weak appearing. HEENT:NCAT, PERRL Respiratory system: bilaterally diminished,no use of accessory muscle, non tender. Cardiovascular system: S1 & S2 +, regular Gastrointestinal system: Abdomen soft, NT,ND, BS+. Nervous System:Alert, awake, moving extremities and grossly nonfocal Extremities: mild peripheral edema, distal peripheral pulses palpable.  Skin: hyperpigmented nodular lesions on lower extremity, pruritis MSK: Normal muscle bulk,tone, power  Data Reviewed: I have personally reviewed following labs and imaging studies CBC: Recent Labs  Lab 08/09/20 1440 08/10/20 0117 08/10/20 0403 08/10/20 0845 08/10/20 1833 08/11/20 0908  WBC 5.8  --  6.2 7.1 7.1 8.0  NEUTROABS 3.4  --   --   --   --   --   HGB 7.1* 8.2* 7.5* 5.8*  5.4* 7.3* 7.4*  HCT 22.8*  24.0* 24.2* 18.9*  16.0* 22.4* 22.5*  MCV 111.8*  --  111.5* 110.5* 100.4* 99.1  PLT 169  --  122* 128* 119* 427*   Basic Metabolic Panel: Recent Labs  Lab 08/09/20 1537 08/10/20 0117 08/10/20 0845 08/11/20 0240  NA 138 136 136 139  K 4.7 4.4 4.4 5.2*  CL 100 101 102 102  CO2 21*  --   --  18*  GLUCOSE 104* 91 94 83  BUN 79* 101* 114* 134*  CREATININE 10.51* 11.50* 13.10* 12.07*  CALCIUM 8.2*  --   --  8.3*   GFR: Estimated Creatinine Clearance: 8.7 mL/min (A) (by C-G formula based on SCr of 12.07 mg/dL (H)). Liver Function Tests: No results for input(s): AST, ALT, ALKPHOS, BILITOT, PROT, ALBUMIN in the last 168 hours. No results for input(s): LIPASE, AMYLASE in the last 168 hours. No results for input(s): AMMONIA in the last 168 hours. Coagulation Profile: Recent Labs  Lab 08/09/20 1524  INR 1.1   CBG: Recent Labs  Lab 08/11/20 0046 08/11/20 0347 08/11/20 0842 08/11/20 1350 08/11/20 1658  GLUCAP 84 85 72 96 90     Recent Results (from the past 240 hour(s))  Resp Panel by RT-PCR (Flu A&B, Covid) Nasopharyngeal Swab     Status: None   Collection Time: 08/10/20  2:58 AM   Specimen: Nasopharyngeal Swab; Nasopharyngeal(NP) swabs in vial transport medium  Result Value Ref Range Status   SARS Coronavirus 2 by RT PCR NEGATIVE NEGATIVE Final    Comment: (NOTE) SARS-CoV-2 target nucleic acids are NOT DETECTED.  The SARS-CoV-2 RNA is generally detectable in upper respiratory specimens during the acute phase of infection. The lowest concentration of SARS-CoV-2 viral copies this assay can detect is 138 copies/mL. A negative  result does not preclude SARS-Cov-2 infection and should not be used as the sole basis for treatment or other patient management decisions. A negative result may occur with  improper specimen collection/handling, submission of specimen other than nasopharyngeal swab, presence of viral mutation(s) within the areas targeted by this assay, and  inadequate number of viral copies(<138 copies/mL). A negative result must be combined with clinical observations, patient history, and epidemiological information. The expected result is Negative.  Fact Sheet for Patients:  EntrepreneurPulse.com.au  Fact Sheet for Healthcare Providers:  IncredibleEmployment.be  This test is no t yet approved or cleared by the [place] FDA and  has been authorized for detection and/or diagnosis of SARS-CoV-2 by FDA under an Emergency Use Authorization (EUA). This EUA will remain  in effect (meaning this test can be used) for the duration of the COVID-19 declaration under Section 564(b)(1) of the Act, 21 U.S.C.section 360bbb-3(b)(1), unless the authorization is terminated  or revoked sooner.       Influenza A by PCR NEGATIVE NEGATIVE Final   Influenza B by PCR NEGATIVE NEGATIVE Final    Comment: (NOTE) The Xpert Xpress SARS-CoV-2/FLU/RSV plus assay is intended as an aid in the diagnosis of influenza from Nasopharyngeal swab specimens and should not be used as a sole basis for treatment. Nasal washings and aspirates are unacceptable for Xpert Xpress SARS-CoV-2/FLU/RSV testing.  Fact Sheet for Patients: EntrepreneurPulse.com.au  Fact Sheet for Healthcare Providers: IncredibleEmployment.be  This test is not yet approved or cleared by the [place] FDA and has been authorized for detection and/or diagnosis of SARS-CoV-2 by FDA under an Emergency Use Authorization (EUA). This EUA will remain in effect (meaning this test can be used) for the duration of the COVID-19 declaration under Section 564(b)(1) of the Act, 21 U.S.C. section 360bbb-3(b)(1), unless the authorization is terminated or revoked.  Performed at [facility], [place] [address], [place], [place] [address]      Radiology Studies: CT ABDOMEN PELVIS WO CONTRAST  Result Date: 08/10/2020 CLINICAL  DATA:  Generalized abdominal pain, anemia. EXAM: CT ABDOMEN AND PELVIS WITHOUT CONTRAST TECHNIQUE: Multidetector CT imaging of the abdomen and pelvis was performed following the standard protocol without IV contrast. COMPARISON:  July 07, 2020. FINDINGS: Lower chest: No acute abnormality. Hepatobiliary: No focal liver abnormality is seen. No gallstones, gallbladder wall thickening, or biliary dilatation. Pancreas: Unremarkable. No pancreatic ductal dilatation or surrounding inflammatory changes. Spleen: Normal in size without focal abnormality. Adrenals/Urinary Tract: Adrenal glands appear normal. Small nonobstructive left renal calculus is noted. No hydronephrosis or renal obstruction is noted. Urinary bladder is unremarkable. Stomach/Bowel: The stomach appears normal. There is no evidence of bowel obstruction or inflammation. Vascular/Lymphatic: Aortic atherosclerosis. No enlarged abdominal or pelvic lymph nodes. Reproductive: Prostate is unremarkable. Other: No abdominal wall hernia or abnormality. No abdominopelvic ascites. Musculoskeletal: No acute or significant osseous findings. IMPRESSION: Small nonobstructive left renal calculus. No hydronephrosis or renal obstruction is noted. No acute abnormality seen in the abdomen or pelvis. Aortic Atherosclerosis (ICD10-I70.0). Electronically Signed   By: Marijo Conception M.D.   On: 08/10/2020 12:52     LOS: 0 days   George Hugh, MD [facility]  08/11/2020, 5:17 PM

## 2020-08-11 NOTE — Plan of Care (Signed)
  Problem: Health Behavior/Discharge Planning: Goal: Ability to manage health-related needs will improve Outcome: Progressing   Problem: Coping: Goal: Level of anxiety will decrease Outcome: Progressing   Problem: Pain Managment: Goal: General experience of comfort will improve Outcome: Progressing   

## 2020-08-11 NOTE — Progress Notes (Signed)
CBG 65, intake of soda and CBG 84.

## 2020-08-11 NOTE — Plan of Care (Signed)
  Problem: Health Behavior/Discharge Planning: Goal: Ability to manage health-related needs will improve Outcome: Progressing   Problem: Coping: Goal: Level of anxiety will decrease Outcome: Progressing   Problem: Pain Managment: Goal: General experience of comfort will improve Outcome: Progressing   Problem: Education: Goal: Knowledge of disease and its progression will improve Outcome: Progressing   Problem: Clinical Measurements: Goal: Complications related to the disease process, condition or treatment will be avoided or minimized Outcome: Progressing

## 2020-08-11 NOTE — Progress Notes (Addendum)
Patient ID: Daniel [name], male   DOB: 04/03/1961, 60 y.o.   MRN: [id number]    Progress Note   Subjective   day # 2  CC; GI bleed/ LUEpain  HGB 7.4 this a.m.-post 2 units  Protonix infusion   EGD-actively bleeding cratered duodenal ulcer with visible vessel 9 mm, treated with 3 hemostatic clips, there is inflammation with nodularity and polypoid mucosa in the duodenal bulb not biopsied in setting of active bleeding  Patient seems to be feeling a bit better today no complaints of abdominal pain no nausea or vomiting.  Says he did have 1 black stool last night He is hungry but n.p.o. this morning but is n.p.o. as he is to have dialysis catheter placed Does not remember yesterday's events     Objective   Vital signs in last 24 hours: Temp:  [97.5 F (36.4 C)-98.2 F (36.8 C)] 97.5 F (36.4 C) (04/16 0943) Pulse Rate:  [77-100] 77 (04/16 0943) Resp:  [15-23] 18 (04/16 0943) BP: (133-213)/(51-99) 138/81 (04/16 0943) SpO2:  [99 %-100 %] 100 % (04/16 0943) Weight:  [120.2 kg-124.2 kg] 124.2 kg (04/16 0500) Last BM Date: 08/10/20 General: African-American male in NAD confused regarding yesterday's events Heart:  Regular rate and rhythm; no murmurs Lungs: Respirations even and unlabored, lungs CTA bilaterally Abdomen:  Soft, obese nontender and nondistended. Normal bowel sounds. Extremities: Left upper extremity swollen and tender Neurologic:  Alert, oriented to person and place Psych:  Cooperative. Normal mood and affect.  Intake/Output from previous day: 04/15 0701 - 04/16 0700 In: 465 [P.O.:100; I.V.:500; Blood:335] Out: 2 [Stool:2] Intake/Output this shift: No intake/output data recorded.  Lab Results: Recent Labs    08/10/20 0403 08/10/20 0845 08/10/20 1833  WBC 6.2 7.1 7.1  HGB 7.5* 5.8*  5.4* 7.3*  HCT 24.2* 18.9*  16.0* 22.4*  PLT 122* 128* 119*   BMET Recent Labs    08/09/20 1537 08/10/20 0117 08/10/20 0845 08/11/20 0240  NA 138 136 136 139  K  4.7 4.4 4.4 5.2*  CL 100 101 102 102  CO2 21*  --   --  18*  GLUCOSE 104* 91 94 83  BUN 79* 101* 114* 134*  CREATININE 10.51* 11.50* 13.10* 12.07*  CALCIUM 8.2*  --   --  8.3*   LFT No results for input(s): PROT, ALBUMIN, AST, ALT, ALKPHOS, BILITOT, BILIDIR, IBILI in the last 72 hours. PT/INR Recent Labs    08/09/20 1524  LABPROT 14.0  INR 1.1    Studies/Results: CT ABDOMEN PELVIS WO CONTRAST  Result Date: 08/10/2020 CLINICAL DATA:  Generalized abdominal pain, anemia. EXAM: CT ABDOMEN AND PELVIS WITHOUT CONTRAST TECHNIQUE: Multidetector CT imaging of the abdomen and pelvis was performed following the standard protocol without IV contrast. COMPARISON:  July 07, 2020. FINDINGS: Lower chest: No acute abnormality. Hepatobiliary: No focal liver abnormality is seen. No gallstones, gallbladder wall thickening, or biliary dilatation. Pancreas: Unremarkable. No pancreatic ductal dilatation or surrounding inflammatory changes. Spleen: Normal in size without focal abnormality. Adrenals/Urinary Tract: Adrenal glands appear normal. Small nonobstructive left renal calculus is noted. No hydronephrosis or renal obstruction is noted. Urinary bladder is unremarkable. Stomach/Bowel: The stomach appears normal. There is no evidence of bowel obstruction or inflammation. Vascular/Lymphatic: Aortic atherosclerosis. No enlarged abdominal or pelvic lymph nodes. Reproductive: Prostate is unremarkable. Other: No abdominal wall hernia or abnormality. No abdominopelvic ascites. Musculoskeletal: No acute or significant osseous findings. IMPRESSION: Small nonobstructive left renal calculus. No hydronephrosis or renal obstruction is noted. No acute abnormality  seen in the abdomen or pelvis. Aortic Atherosclerosis (ICD10-I70.0). Electronically Signed   By: Marijo Conception M.D.   On: 08/10/2020 12:52       Assessment / Plan:    #76 60 year old African-American male with end-stage renal disease on dialysis, presenting with  melena, anemia and left upper extremity pain which had been progressive.  hemoglobin of 5.8 on admission  He has been transfused 2 units of packed RBCs and hemoglobin this a.m. is pending EGD yesterday with finding of a duodenal ulcer with active bleeding and visible vessel which was treated with hemoclips for hemostasis.  Surrounding this there is an area of nodularity and polypoid mucosa not biopsied yesterday  Plan; okay for full liquid diet from GI perspective postdialysis catheter placement today Continue PPI infusion for at least another 24 hours then p.o. Protonix twice daily Continue to trend hemoglobins and transfuse as needed  Patient will need repeat EGD in 2 to 3 months with Dr. Havery Moros to document healing of this ulcer and also to follow-up on the area of duodenal nodularity and polyps.  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     Principal Problem:   Acute GI bleeding Active Problems:   Hypertension   DM2 (diabetes mellitus, type 2) ([place])   Anemia   ESRD (end stage renal disease) ([place])   Gastrointestinal hemorrhage with melena   Duodenal ulcer hemorrhage     LOS: 0 days   Georgia Baria PA-C 08/11/2020, 9:58 AM

## 2020-08-11 NOTE — Progress Notes (Signed)
Daniel [name] Progress Note   Subjective:     Daniel [name] was seen and examined at bedside. Patient is s/p 2 units of PRBCs for Hgb 5.8. Hgb now 7.3. GI following. Patient appears better and asking for something to eat. Now on RA. He reports his breathing has improved. Denies CP, ABD pain, or N/V/D. Previously had hematoma to L AVF d/t traumatic access thus access was unable to be used yesterday. Dr. Donzetta Matters ([facility]) also at bedside. Swelling and erythema of L AVF has improved. Plan is to try to stick L AVF for scheduled HD treatment today. If stick is unsuccessful, plan for [facility] placement for tomorrow 08/12/20.   Objective Vitals:   08/10/20 2025 08/11/20 0048 08/11/20 0357 08/11/20 0500  BP: (!) 164/56 (!) 153/51 139/77   Pulse: 89 84 81   Resp: 18 20 18    Temp: 98.1 F (36.7 C) 98 F (36.7 C) 97.9 F (36.6 C)   TempSrc:   Oral   SpO2: 99% 100% 99%   Weight:    124.2 kg  Height:       Physical Exam General: Appears comfortable; No acute respiratory distress Heart: Normal S1 and S2; No murmurs, gallops, or friction rub. Lungs: Clear throughout w/o wheezing, rales, or rhonchi. Abdomen: Soft, non-tender, active bowel sounds Extremities: No edema bilateral lower extremities Dialysis Access: L AVF (+) bruit/thrill, swelling and erythema appears to be improving.   Filed Weights   08/10/20 0001 08/10/20 1507 08/11/20 0500  Weight: 120.2 kg 120.2 kg 124.2 kg    Intake/Output Summary (Last 24 hours) at 08/11/2020 0907 Last data filed at 08/10/2020 1945 Gross per 24 hour  Intake 935 ml  Output 2 ml  Net 933 ml    Additional Objective Labs: Basic Metabolic Panel: Recent Labs  Lab 08/09/20 1537 08/10/20 0117 08/10/20 0845 08/11/20 0240  NA 138 136 136 139  K 4.7 4.4 4.4 5.2*  CL 100 101 102 102  CO2 21*  --   --  18*  GLUCOSE 104* 91 94 83  BUN 79* 101* 114* 134*  CREATININE 10.51* 11.50* 13.10* 12.07*  CALCIUM 8.2*  --   --  8.3*   CBC: Recent Labs  Lab  08/09/20 1440 08/10/20 0117 08/10/20 0403 08/10/20 0845 08/10/20 1833  WBC 5.8  --  6.2 7.1 7.1  NEUTROABS 3.4  --   --   --   --   HGB 7.1*   < > 7.5* 5.8*  5.4* 7.3*  HCT 22.8*   < > 24.2* 18.9*  16.0* 22.4*  MCV 111.8*  --  111.5* 110.5* 100.4*  PLT 169  --  122* 128* 119*   < > = values in this interval not displayed.   Blood Culture    Component Value Date/Time   SDES BLOOD SITE NOT SPECIFIED 07/07/2020 1025   SPECREQUEST  07/07/2020 1025    BOTTLES DRAWN AEROBIC AND ANAEROBIC Blood Culture adequate volume   CULT  07/07/2020 1025    NO GROWTH 5 DAYS Performed at [facility], [place] [address], [place], [place] [address]    REPTSTATUS 07/12/2020 FINAL 07/07/2020 1025   CBG: Recent Labs  Lab 08/10/20 2020 08/11/20 0017 08/11/20 0046 08/11/20 0347 08/11/20 0842  GLUCAP 101* 65* 84 85 72   Iron Studies: No results for input(s): IRON, TIBC, TRANSFERRIN, FERRITIN in the last 72 hours. Lab Results  Component Value Date   INR 1.1 08/09/2020   INR 1.0 07/07/2020   INR 1.0  08/03/2019   Studies/Results: CT ABDOMEN PELVIS WO CONTRAST  Result Date: 08/10/2020 CLINICAL DATA:  Generalized abdominal pain, anemia. EXAM: CT ABDOMEN AND PELVIS WITHOUT CONTRAST TECHNIQUE: Multidetector CT imaging of the abdomen and pelvis was performed following the standard protocol without IV contrast. COMPARISON:  July 07, 2020. FINDINGS: Lower chest: No acute abnormality. Hepatobiliary: No focal liver abnormality is seen. No gallstones, gallbladder wall thickening, or biliary dilatation. Pancreas: Unremarkable. No pancreatic ductal dilatation or surrounding inflammatory changes. Spleen: Normal in size without focal abnormality. Adrenals/Urinary Tract: Adrenal glands appear normal. Small nonobstructive left renal calculus is noted. No hydronephrosis or renal obstruction is noted. Urinary bladder is unremarkable. Stomach/Bowel: The stomach appears normal. There is no evidence of bowel  obstruction or inflammation. Vascular/Lymphatic: Aortic atherosclerosis. No enlarged abdominal or pelvic lymph nodes. Reproductive: Prostate is unremarkable. Other: No abdominal wall hernia or abnormality. No abdominopelvic ascites. Musculoskeletal: No acute or significant osseous findings. IMPRESSION: Small nonobstructive left renal calculus. No hydronephrosis or renal obstruction is noted. No acute abnormality seen in the abdomen or pelvis. Aortic Atherosclerosis (ICD10-I70.0). Electronically Signed   By: Marijo Conception M.D.   On: 08/10/2020 12:52    Medications: . sodium chloride    . sodium chloride    . sodium chloride 200 mL/hr at 08/10/20 1604  . pantoprozole (PROTONIX) infusion 8 mg/hr (08/11/20 0530)   . sodium chloride   Intravenous Once  . amLODipine  10 mg Oral Daily  . calcium acetate  1,334 mg Oral Q supper  . calcium acetate  667 mg Oral BID WC  . Chlorhexidine Gluconate Cloth  6 each Topical Daily  . cloNIDine  0.2 mg Oral BID  . [START ON 08/14/2020] darbepoetin (ARANESP) injection - DIALYSIS  150 mcg Intravenous Q Tue-HD  . doxercalciferol  3 mcg Intravenous Q T,Th,Sa-HD  . furosemide  40 mg Intravenous Once  . insulin aspart  0-9 Units Subcutaneous Q4H  . [START ON 08/14/2020] pantoprazole  40 mg Intravenous Q12H  . sodium zirconium cyclosilicate  10 g Oral Once    Dialysis Orders: TTS - [facility]  4hrs, BFR 450, DFR 500,  EDW 116.5kg, 3K/ 2.5Ca  Assessment/Plan: 1.  GIB- Patient underwent upper endoscopy 4/15 via Dr. Hilarie Fredrickson: Normal esophagus, gastritis, and bleeding duodenal ulcer with a visible vessel. Clips were placed X 3 with apparent hemostasis. Received 2 units PRBCs for Hgb 5.8. Hgb now 7.3. Will continue to monitor Hgb trends.  2. Hematoma L AVF- Dr. Donzetta Matters at bedside and assessed. Swelling and erythema improving. Plan to try and stick L AVF for scheduled HD today. If unsuccessful, Dr. Donzetta Matters will be notified and [facility] placement will be scheduled for  tomorrow 08/12/20.  3.  ESRD -  HD TTS. Plan for HD today 4/16 for UFG 3-4L as tolerated.  4.  Hypertension/volume  - Blood pressures are improving. Patient not in acute respiratory distress. Plan for HD treatment today 4/16, will try to stick L AVF. 5.  Anemia of CKD - Hgb 5.8; received 2 units PRBCs; Hgb now 7.3. Continue to monitor trend. Aranesp scheduled to start 08/14/20 (last Micera dose given 07/31/20). 6.  Secondary Hyperparathyroidism -  Ca now 8.3. Continue Hectorol and binder. 7.    Nutrition - NPO; patient now wants something to eat; GI needs to clear        before advancing diet.   Tobie Poet, NP [name] 08/11/2020,9:07 AM  LOS: 0 days

## 2020-08-11 NOTE — Progress Notes (Signed)
  Progress Note    08/11/2020 9:26 AM 1 Day Post-Op  Subjective: Patient feeling much better today  Vitals:   08/11/20 0048 08/11/20 0357  BP: (!) 153/51 139/77  Pulse: 84 81  Resp: 20 18  Temp: 98 F (36.7 C) 97.9 F (36.6 C)  SpO2: 100% 99%    Physical Exam: Awake alert and oriented Nonlabored respirations Abdomen is soft nontender Left upper extremity does have some tenderness although the fistula is readily palpable there is some pulsatility in the fistula Left hand is warm and well-perfused  CBC    Component Value Date/Time   WBC 7.1 08/10/2020 1833   RBC 2.23 (L) 08/10/2020 1833   HGB 7.3 (L) 08/10/2020 1833   HCT 22.4 (L) 08/10/2020 1833   HCT 27.5 (L) 01/23/2019 1014   PLT 119 (L) 08/10/2020 1833   MCV 100.4 (H) 08/10/2020 1833   MCH 32.7 08/10/2020 1833   MCHC 32.6 08/10/2020 1833   RDW 22.4 (H) 08/10/2020 1833   LYMPHSABS 2.1 08/09/2020 1440   MONOABS 0.2 08/09/2020 1440   EOSABS 0.0 08/09/2020 1440   BASOSABS 0.1 08/09/2020 1440    BMET    Component Value Date/Time   NA 139 08/11/2020 0240   NA 128 (L) 12/28/2019 1640   K 5.2 (H) 08/11/2020 0240   CL 102 08/11/2020 0240   CO2 18 (L) 08/11/2020 0240   GLUCOSE 83 08/11/2020 0240   BUN 134 (H) 08/11/2020 0240   BUN 31 (H) 12/28/2019 1640   CREATININE 12.07 (H) 08/11/2020 0240   CALCIUM 8.3 (L) 08/11/2020 0240   CALCIUM 7.9 (L) 08/01/2019 0702   GFRNONAA 4 (L) 08/11/2020 0240   GFRAA 7 (L) 12/28/2019 1640    INR    Component Value Date/Time   INR 1.1 08/09/2020 1524     Intake/Output Summary (Last 24 hours) at 08/11/2020 0926 Last data filed at 08/10/2020 1945 Gross per 24 hour  Intake 935 ml  Output 2 ml  Net 933 ml     Assessment/plan:  60 y.o. male is here with GI bleed status post upper endoscopy with clipping of duodenal ulceration also had infiltration event of his left upper extremity fistula we had planned tunneled dialysis catheter this was canceled due to aforementioned  issues yesterday.  He has not had dialysis now since Tuesday.  It appears that his fistula may be suitable for use.  I recommended attempting to use the fistula today and if it cannot be used I will plan to place tunneled dialysis catheter tomorrow.  He is okay for diet from vascular standpoint.     Abigail Marsiglia C. Donzetta Matters, MD [facility] Office: [phone] Pager: [phone]  08/11/2020 9:26 AM

## 2020-08-12 ENCOUNTER — Inpatient Hospital Stay (HOSPITAL_COMMUNITY): Payer: Medicare Other

## 2020-08-12 ENCOUNTER — Encounter (HOSPITAL_COMMUNITY)
Admission: EM | Disposition: A | Discharge status: Home Health | Payer: Self-pay | Source: Home / Self Care | Attending: Internal Medicine

## 2020-08-12 ENCOUNTER — Inpatient Hospital Stay (HOSPITAL_COMMUNITY): Payer: Medicare Other | Admitting: Certified Registered Nurse Anesthetist

## 2020-08-12 ENCOUNTER — Encounter (HOSPITAL_COMMUNITY): Payer: Self-pay | Admitting: Internal Medicine

## 2020-08-12 HISTORY — PX: INSERTION OF DIALYSIS CATHETER: SHX1324

## 2020-08-12 LAB — CBC
HCT: 20.2 % — ABNORMAL LOW (ref 39.0–52.0)
Hemoglobin: 6.6 g/dL — CL (ref 13.0–17.0)
MCH: 32.7 pg (ref 26.0–34.0)
MCHC: 32.7 g/dL (ref 30.0–36.0)
MCV: 100 fL (ref 80.0–100.0)
Platelets: 105 10*3/uL — ABNORMAL LOW (ref 150–400)
RBC: 2.02 MIL/uL — ABNORMAL LOW (ref 4.22–5.81)
RDW: 21.8 % — ABNORMAL HIGH (ref 11.5–15.5)
WBC: 6 10*3/uL (ref 4.0–10.5)
nRBC: 0.5 % — ABNORMAL HIGH (ref 0.0–0.2)

## 2020-08-12 LAB — PROCALCITONIN: Procalcitonin: 0.73 ng/mL

## 2020-08-12 LAB — GLUCOSE, CAPILLARY
Glucose-Capillary: 101 mg/dL — ABNORMAL HIGH (ref 70–99)
Glucose-Capillary: 102 mg/dL — ABNORMAL HIGH (ref 70–99)
Glucose-Capillary: 115 mg/dL — ABNORMAL HIGH (ref 70–99)
Glucose-Capillary: 136 mg/dL — ABNORMAL HIGH (ref 70–99)
Glucose-Capillary: 154 mg/dL — ABNORMAL HIGH (ref 70–99)
Glucose-Capillary: 80 mg/dL (ref 70–99)

## 2020-08-12 LAB — HEPATIC FUNCTION PANEL
ALT: 16 U/L (ref 0–44)
AST: 39 U/L (ref 15–41)
Albumin: 2.1 g/dL — ABNORMAL LOW (ref 3.5–5.0)
Alkaline Phosphatase: 38 U/L (ref 38–126)
Bilirubin, Direct: 0.2 mg/dL (ref 0.0–0.2)
Indirect Bilirubin: 0.5 mg/dL (ref 0.3–0.9)
Total Bilirubin: 0.7 mg/dL (ref 0.3–1.2)
Total Protein: 5.9 g/dL — ABNORMAL LOW (ref 6.5–8.1)

## 2020-08-12 LAB — BASIC METABOLIC PANEL
Anion gap: 15 (ref 5–15)
BUN: 123 mg/dL — ABNORMAL HIGH (ref 6–20)
CO2: 23 mmol/L (ref 22–32)
Calcium: 8.2 mg/dL — ABNORMAL LOW (ref 8.9–10.3)
Chloride: 102 mmol/L (ref 98–111)
Creatinine, Ser: 11.59 mg/dL — ABNORMAL HIGH (ref 0.61–1.24)
GFR, Estimated: 5 mL/min — ABNORMAL LOW (ref >60)
Glucose, Bld: 104 mg/dL — ABNORMAL HIGH (ref 70–99)
Potassium: 4.8 mmol/L (ref 3.5–5.1)
Sodium: 140 mmol/L (ref 135–145)

## 2020-08-12 LAB — PREPARE RBC (CROSSMATCH)

## 2020-08-12 LAB — C-REACTIVE PROTEIN: CRP: 0.6 mg/dL (ref <1.0)

## 2020-08-12 LAB — PHOSPHORUS: Phosphorus: 9.9 mg/dL — ABNORMAL HIGH (ref 2.5–4.6)

## 2020-08-12 LAB — HEMOGLOBIN AND HEMATOCRIT, BLOOD
HCT: 25.4 % — ABNORMAL LOW (ref 39.0–52.0)
Hemoglobin: 8.3 g/dL — ABNORMAL LOW (ref 13.0–17.0)

## 2020-08-12 LAB — SEDIMENTATION RATE: Sed Rate: 60 mm/hr — ABNORMAL HIGH (ref 0–16)

## 2020-08-12 SURGERY — INSERTION OF DIALYSIS CATHETER
Anesthesia: General | Site: Chest | Laterality: Right

## 2020-08-12 MED ORDER — HEPARIN SODIUM (PORCINE) 1000 UNIT/ML DIALYSIS: 1000.0000 [IU] | INTRAMUSCULAR | Status: DC | PRN

## 2020-08-12 MED ORDER — SODIUM CHLORIDE 0.9 % IV SOLN: 100.0000 mL | INTRAVENOUS | Status: DC | PRN

## 2020-08-12 MED ORDER — SODIUM CHLORIDE 0.9 % IV SOLN: INTRAVENOUS | Status: DC | PRN

## 2020-08-12 MED ORDER — DEXTROSE 5 % IV SOLN
INTRAVENOUS | Status: DC | PRN
  Administered 2020-08-12: 3 g via INTRAVENOUS

## 2020-08-12 MED ORDER — SODIUM CHLORIDE 0.9 % IV SOLN
INTRAVENOUS | Status: AC
  Filled 2020-08-12: qty 1.2

## 2020-08-12 MED ORDER — LIDOCAINE HCL (PF) 1 % IJ SOLN
INTRAMUSCULAR | Status: AC
  Filled 2020-08-12: qty 30

## 2020-08-12 MED ORDER — FENTANYL CITRATE (PF) 250 MCG/5ML IJ SOLN
INTRAMUSCULAR | Status: DC | PRN
  Administered 2020-08-12: 25 ug via INTRAVENOUS
  Administered 2020-08-12: 50 ug via INTRAVENOUS
  Administered 2020-08-12: 25 ug via INTRAVENOUS

## 2020-08-12 MED ORDER — CHLORHEXIDINE GLUCONATE 0.12 % MT SOLN
OROMUCOSAL | Status: AC
  Administered 2020-08-12: 15 mL via OROMUCOSAL
  Filled 2020-08-12: qty 15

## 2020-08-12 MED ORDER — LIDOCAINE HCL (PF) 1 % IJ SOLN: 5.0000 mL | INTRAMUSCULAR | Status: DC | PRN

## 2020-08-12 MED ORDER — FENTANYL CITRATE (PF) 250 MCG/5ML IJ SOLN
INTRAMUSCULAR | Status: AC
  Filled 2020-08-12: qty 5

## 2020-08-12 MED ORDER — HYDROXYZINE HCL 25 MG PO TABS
25.0000 mg | ORAL_TABLET | Freq: Three times a day (TID) | ORAL | Status: DC
  Administered 2020-08-12 – 2020-08-13 (×3): 25 mg via ORAL
  Filled 2020-08-12 (×3): qty 1

## 2020-08-12 MED ORDER — HEPARIN SODIUM (PORCINE) 1000 UNIT/ML IJ SOLN
INTRAMUSCULAR | Status: AC
  Administered 2020-08-12: 1000 [IU] via INTRAVENOUS_CENTRAL
  Filled 2020-08-12: qty 4

## 2020-08-12 MED ORDER — FENTANYL CITRATE (PF) 100 MCG/2ML IJ SOLN
25.0000 ug | INTRAMUSCULAR | Status: DC | PRN
  Administered 2020-08-12: 25 ug via INTRAVENOUS

## 2020-08-12 MED ORDER — PENTAFLUOROPROP-TETRAFLUOROETH EX AERO: 1.0000 "application " | INHALATION_SPRAY | CUTANEOUS | Status: DC | PRN

## 2020-08-12 MED ORDER — MIDAZOLAM HCL 2 MG/2ML IJ SOLN
INTRAMUSCULAR | Status: AC
  Filled 2020-08-12: qty 2

## 2020-08-12 MED ORDER — SODIUM CHLORIDE 0.9% IV SOLUTION: Freq: Once | INTRAVENOUS | Status: DC

## 2020-08-12 MED ORDER — LIDOCAINE-PRILOCAINE 2.5-2.5 % EX CREA: 1.0000 "application " | TOPICAL_CREAM | CUTANEOUS | Status: DC | PRN

## 2020-08-12 MED ORDER — HYDROMORPHONE HCL 1 MG/ML IJ SOLN
0.2500 mg | Freq: Once | INTRAMUSCULAR | Status: AC
Start: 2020-08-12 — End: 2020-08-12
  Administered 2020-08-12: 0.25 mg via INTRAVENOUS
  Filled 2020-08-12: qty 1

## 2020-08-12 MED ORDER — ALTEPLASE 2 MG IJ SOLR: 2.0000 mg | Freq: Once | INTRAMUSCULAR | Status: DC | PRN

## 2020-08-12 MED ORDER — HEPARIN SODIUM (PORCINE) 1000 UNIT/ML IJ SOLN
INTRAMUSCULAR | Status: AC
  Filled 2020-08-12: qty 1

## 2020-08-12 MED ORDER — MIDAZOLAM HCL 2 MG/2ML IJ SOLN
INTRAMUSCULAR | Status: DC | PRN
  Administered 2020-08-12 (×2): 1 mg via INTRAVENOUS

## 2020-08-12 MED ORDER — FENTANYL CITRATE (PF) 100 MCG/2ML IJ SOLN
INTRAMUSCULAR | Status: AC
  Filled 2020-08-12: qty 2

## 2020-08-12 MED ORDER — LIDOCAINE-EPINEPHRINE 1 %-1:100000 IJ SOLN
INTRAMUSCULAR | Status: DC | PRN
  Administered 2020-08-12: 20 mL

## 2020-08-12 MED ORDER — PROPOFOL 10 MG/ML IV BOLUS
INTRAVENOUS | Status: AC
  Filled 2020-08-12: qty 20

## 2020-08-12 MED ORDER — HEPARIN SODIUM (PORCINE) 1000 UNIT/ML IJ SOLN
INTRAMUSCULAR | Status: DC | PRN
  Administered 2020-08-12: 3.2 [IU] via INTRAVENOUS

## 2020-08-12 MED ORDER — LIDOCAINE-EPINEPHRINE 1 %-1:100000 IJ SOLN
INTRAMUSCULAR | Status: AC
  Filled 2020-08-12: qty 1

## 2020-08-12 MED ORDER — SODIUM THIOSULFATE 250 MG/ML IV SOLN
25.0000 g | INTRAVENOUS | Status: DC
  Administered 2020-08-14 – 2020-08-16 (×2): 25 g via INTRAVENOUS
  Filled 2020-08-12 (×2): qty 100

## 2020-08-12 MED ORDER — SODIUM CHLORIDE 0.9 % IV SOLN: INTRAVENOUS | Status: DC

## 2020-08-12 MED ORDER — 0.9 % SODIUM CHLORIDE (POUR BTL) OPTIME
TOPICAL | Status: DC | PRN
  Administered 2020-08-12: 1000 mL

## 2020-08-12 MED ORDER — CHLORHEXIDINE GLUCONATE 0.12 % MT SOLN: 15.0000 mL | Freq: Once | OROMUCOSAL | Status: AC

## 2020-08-12 SURGICAL SUPPLY — 38 items
ADH SKN CLS APL DERMABOND .7 (GAUZE/BANDAGES/DRESSINGS) ×1
BAG DECANTER FOR FLEXI CONT (MISCELLANEOUS) ×2 IMPLANT
BIOPATCH RED 1 DISK 7.0 (GAUZE/BANDAGES/DRESSINGS) ×2 IMPLANT
CATH PALINDROME RT-P 15FX19CM (CATHETERS) ×2 IMPLANT
CATH PALINDROME-P 19CM W/VT (CATHETERS) IMPLANT
CATH PALINDROME-P 23CM W/VT (CATHETERS) IMPLANT
CATH PALINDROME-P 28CM W/VT (CATHETERS) IMPLANT
COVER PROBE W GEL 5X96 (DRAPES) ×2 IMPLANT
COVER SURGICAL LIGHT HANDLE (MISCELLANEOUS) ×2 IMPLANT
COVER WAND RF STERILE (DRAPES) ×2 IMPLANT
DERMABOND ADVANCED (GAUZE/BANDAGES/DRESSINGS) ×1
DERMABOND ADVANCED .7 DNX12 (GAUZE/BANDAGES/DRESSINGS) ×1 IMPLANT
DRAPE C-ARM 42X72 X-RAY (DRAPES) ×2 IMPLANT
DRAPE CHEST BREAST 15X10 FENES (DRAPES) ×2 IMPLANT
GAUZE 4X4 16PLY RFD (DISPOSABLE) ×2 IMPLANT
GLOVE BIO SURGEON STRL SZ7.5 (GLOVE) ×2 IMPLANT
GOWN STRL REUS W/ TWL LRG LVL3 (GOWN DISPOSABLE) ×2 IMPLANT
GOWN STRL REUS W/ TWL XL LVL3 (GOWN DISPOSABLE) ×1 IMPLANT
GOWN STRL REUS W/TWL LRG LVL3 (GOWN DISPOSABLE) ×4
GOWN STRL REUS W/TWL XL LVL3 (GOWN DISPOSABLE) ×2
KIT BASIN OR (CUSTOM PROCEDURE TRAY) ×2 IMPLANT
KIT PALINDROME-P 55CM (CATHETERS) IMPLANT
KIT TURNOVER KIT B (KITS) ×2 IMPLANT
NEEDLE 18GX1X1/2 (RX/OR ONLY) (NEEDLE) ×2 IMPLANT
NEEDLE HYPO 25GX1X1/2 BEV (NEEDLE) ×2 IMPLANT
NS IRRIG 1000ML POUR BTL (IV SOLUTION) ×2 IMPLANT
PACK SURGICAL SETUP 50X90 (CUSTOM PROCEDURE TRAY) ×2 IMPLANT
PAD ARMBOARD 7.5X6 YLW CONV (MISCELLANEOUS) ×4 IMPLANT
SOAP 2 % CHG 4 OZ (WOUND CARE) ×2 IMPLANT
SUT ETHILON 3 0 PS 1 (SUTURE) ×2 IMPLANT
SUT MNCRL AB 4-0 PS2 18 (SUTURE) ×2 IMPLANT
SYR 10ML LL (SYRINGE) ×4 IMPLANT
SYR 20ML LL LF (SYRINGE) ×4 IMPLANT
SYR 5ML LL (SYRINGE) ×2 IMPLANT
SYR CONTROL 10ML LL (SYRINGE) ×2 IMPLANT
TOWEL GREEN STERILE (TOWEL DISPOSABLE) ×2 IMPLANT
TOWEL GREEN STERILE FF (TOWEL DISPOSABLE) ×4 IMPLANT
WATER STERILE IRR 1000ML POUR (IV SOLUTION) ×2 IMPLANT

## 2020-08-12 NOTE — Progress Notes (Signed)
[facility] Progress Note   Subjective:     Patient seen and examined at bedside s/p [facility] placement today via Dr. Donzetta Matters. L AVF currently malfunctioned. Patient tolerated procedure well. Denies SOB, CP, ABD pain, and N/V at this time. Of note, Hgb 6.6. Order to receive 1 unit PRBC with HD today. Additionally, discussed with primary, patient with lower extremity lesions suggestive for calciphylaxis. Reviewed records in [place]. 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 Sodium Thiosulfate ordered to be given during last hour of HD treatments. Plan for HD today for UFG 4-5L.   Objective Vitals:   08/12/20 1318 08/12/20 1334 08/12/20 1338 08/12/20 1350  BP: 139/66 (!) 83/62 98/84 (!) 129/42  Pulse:   84   Resp:  16 18 14   Temp:   97.9 F (36.6 C)   TempSrc:   Oral   SpO2:   94%   Weight:      Height:       Physical Exam General: Appears comfortable; No acute respiratory distress Heart: Normal S1 and S2; No murmurs, gallops, or friction rub. Lungs: Clear throughout w/o wheezing, rales, or rhonchi. Abdomen: Soft, non-tender, active bowel sounds Extremities: Noted dark lesion bilateral lower extremities; No edema bilateral lower extremities Dialysis Access: R TDC; L AVF-currently not working  [employer]   08/11/20 1243 08/12/20 0500 08/12/20 1308  Weight: 122.1 kg 123.7 kg 122 kg    Intake/Output Summary (Last 24 hours) at 08/12/2020 1408 Last data filed at 08/12/2020 0858 Gross per 24 hour  Intake 810 ml  Output 805 ml  Net 5 ml    Additional Objective Labs: Basic Metabolic Panel: Recent Labs  Lab 08/09/20 1537 08/10/20 0117 08/10/20 0845 08/11/20 0240 08/12/20 0021  NA 138   < > 136 139 140  K 4.7   < > 4.4 5.2* 4.8  CL 100   < > 102 102 102  CO2 21*  --   --  18* 23  GLUCOSE 104*   < > 94 83 104*  BUN 79*   < > 114* 134* 123*  CREATININE 10.51*   < > 13.10* 12.07* 11.59*  CALCIUM 8.2*  --   --  8.3* 8.2*  PHOS  --   --   --    --  9.9*   < > = values in this interval not displayed.   Liver Function Tests: Recent Labs  Lab 08/12/20 0021  AST 39  ALT 16  ALKPHOS 38  BILITOT 0.7  PROT 5.9*  ALBUMIN 2.1*   No results for input(s): LIPASE, AMYLASE in the last 168 hours. CBC: Recent Labs  Lab 08/09/20 1440 08/10/20 0117 08/10/20 0403 08/10/20 0845 08/10/20 1833 08/11/20 0908 08/12/20 0021  WBC 5.8  --  6.2 7.1 7.1 8.0 6.0  NEUTROABS 3.4  --   --   --   --   --   --   HGB 7.1*   < > 7.5* 5.8*  5.4* 7.3* 7.4* 6.6*  HCT 22.8*   < > 24.2* 18.9*  16.0* 22.4* 22.5* 20.2*  MCV 111.8*  --  111.5* 110.5* 100.4* 99.1 100.0  PLT 169  --  122* 128* 119* 120* 105*   < > = values in this interval not displayed.   Blood Culture    Component Value Date/Time   SDES BLOOD SITE NOT SPECIFIED 07/07/2020 1025   SPECREQUEST  07/07/2020 1025    BOTTLES DRAWN AEROBIC AND ANAEROBIC Blood Culture adequate volume  CULT  07/07/2020 1025    NO GROWTH 5 DAYS Performed at [facility], [place] [address] [place], [place] [address]    REPTSTATUS 07/12/2020 FINAL 07/07/2020 1025    Cardiac Enzymes: No results for input(s): CKTOTAL, CKMB, CKMBINDEX, TROPONINI in the last 168 hours. CBG: Recent Labs  Lab 08/11/20 2016 08/11/20 2316 08/12/20 0717 08/12/20 0825 08/12/20 1220  GLUCAP 99 103* 115* 80 101*   Iron Studies: No results for input(s): IRON, TIBC, TRANSFERRIN, FERRITIN in the last 72 hours. Lab Results  Component Value Date   INR 1.1 08/09/2020   INR 1.0 07/07/2020   INR 1.0 08/03/2019   Studies/Results: DG CHEST PORT 1 VIEW  Result Date: 08/12/2020 CLINICAL DATA:  Status post hemodialysis catheter insertion. EXAM: PORTABLE CHEST 1 VIEW COMPARISON:  08/04/2020 FINDINGS: Patient has RIGHT-sided internal jugular dialysis catheter, tip overlying the level of the UPPER RIGHT atrium. There is no pneumothorax following line placement. The heart is enlarged and stable in configuration.  Lungs are clear.  IMPRESSION: Interval placement of RIGHT internal jugular dialysis catheter. Stable cardiomegaly. Electronically Signed   By: Nolon Nations M.D.   On: 08/12/2020 09:05   DG Ankle Left Port  Result Date: 08/12/2020 CLINICAL DATA:  Evaluate for soft tissue calcifications suggesting renal calcinosis. EXAM: PORTABLE LEFT ANKLE - 2 VIEW COMPARISON:  None. FINDINGS: Extensive vascular atherosclerosis throughout the LEFT lower extremity. No additional soft tissue calcifications. Osseous alignment is normal. No fracture line or displaced fracture fragment. No acute-appearing cortical irregularity or osseous lesion. Fixation hardware appears appropriately positioned within the calcaneus. IMPRESSION: 1. No acute findings. 2. Extensive vascular calcifications (atherosclerosis) throughout this visualized portion of the LEFT lower extremity. 3. No additional soft tissue calcifications. Electronically Signed   By: Franki Cabot M.D.   On: 08/12/2020 08:04   DG Ankle Right Port  Result Date: 08/11/2020 CLINICAL DATA:  Renal disease with possible soft tissue calcifications. EXAM: PORTABLE RIGHT ANKLE - 2 VIEW COMPARISON:  None. FINDINGS: Bony structures show no acute fracture or dislocation. Diffuse vascular calcifications are noted. No significant soft tissue calcifications are noted that are not vascular in nature. 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 IMPRESSION: Diffuse vascular calcifications. No other acute abnormality is noted. Electronically Signed   By: Inez Catalina M.D.   On: 08/11/2020 19:13   DG Fluoro Guide CV Line-No Report  Result Date: 08/12/2020 Fluoroscopy was utilized by the requesting physician.  No radiographic interpretation.    Medications: . sodium chloride 200 mL/hr at 08/10/20 1604  . sodium chloride    . sodium chloride    . pantoprozole (PROTONIX) infusion 8 mg/hr (08/12/20 0928)  . [START ON 08/14/2020] sodium thiosulfate infusion for  calciphylaxis     . sodium chloride   Intravenous Once  . amLODipine  10 mg Oral Daily  . calcium acetate  1,334 mg Oral Q supper  . calcium acetate  667 mg Oral BID WC  . capsaicin   Topical BID  . Chlorhexidine Gluconate Cloth  6 each Topical Daily  . cloNIDine  0.2 mg Oral BID  . [START ON 08/14/2020] darbepoetin (ARANESP) injection - DIALYSIS  150 mcg Intravenous Q Tue-HD  . doxercalciferol  3 mcg Intravenous Q T,Th,Sa-HD  . fentaNYL      . furosemide  40 mg Intravenous Once  . gabapentin  100 mg Oral QHS  . hydrOXYzine  25 mg Oral TID  . insulin aspart  0-9 Units Subcutaneous Q4H  . [  START ON 08/14/2020] pantoprazole  40 mg Intravenous Q12H  . 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 Discussed with primary, Sodium Thiosulfate ordered to be given during last hour of HD treatments. Medication to start on 4/19. Patient currently on Calcium Acetate and Hectorol. Last PTH: 388 (07/12/20 outpatient hemodialysis). PO4: 9.9 and Corr Ca 9.2. Will order PTH to be drawn tomorrow morning. More likely will need to change to non-calcium binder and possible start [place].   2. GIB- Patient underwent upper endoscopy 4/15 via Dr. Hilarie Fredrickson: Normal esophagus, gastritis, and bleeding duodenal ulcer with a visible vessel. Clips were placed X 3 with apparent hemostasis. Received 2 units on 4/15. Hgb:7.4 08/11/20; Hgb now 6.6-Ordered 1 unit PRBC to be given in HD today. Will continue to monitor Hgb trends.  3. Hematoma L AVF- Tried to stick L AVF for yesterday's treatment; however, unsuccessful. R IJ TDC placed today. According to [facility] note, will need fistulogram in near future.  4. ESRD- HD TTS. Plan for HD today 4/17 for UFG 4-5L as  tolerated-could not complete HD yesterday d/t L AVF malfunction. Next HD back on patient's regular schedule 08/14/20. 5. Hypertension/volume- Blood pressures are improving. Patient not in acute respiratory distress. Plan for HD treatment today 4/17,  6. Anemiaof CKD- Received 2 units PRBCs on 4/15; Hgb now 6.6. 1 unit PRBC given today during HD. Continue to monitor trend. Aranesp scheduled to start 08/14/20 (last Micera dose given 07/31/20). 7. Secondary Hyperparathyroidism -Corr Ca 9.2. Continue Hectorol and binder for now. Will order PTH. Depending on PTH results and given Calciphylaxis, may need to make adjustments to binders. 8. Nutrition- Renal diet with fluid restriction  Tobie Poet, NP [place] 08/12/2020,2:08 PM  LOS: 1 day

## 2020-08-12 NOTE — Progress Notes (Addendum)
PROGRESS NOTE    Daniel [name]  HYQ:[id number] DOB: 02-Aug-1960 DOA: 08/09/2020 PCP: [name], NP   Chief Complaint  Patient presents with  . 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  EGD showed actively bleeding 9 mm duodenal ulcer and 3 clips were placed.   Nephrology is following patient regarding dialysis.  On 08/03/2019, patient had a AV fistula placed in the left arm by Vascular Dr. Donzetta Matters that was complicated by a small traumatic hematoma formation that was deemed nonfunctional.  On 4/17, Vascular placed tunneled dialysis catheter in the right IJV.  Subjective: Patient is in a lot of pain this am. Endorses significant itching in whole body. Lesions look like hyperpigmented xerosis commonly seen with dialysis patients. Hb is 6.6 this am. There are no episodes of acute bleeding. Patient will receive unit of blood with dialysis. I spoke with GI who will follow patient.   Assessment & Plan: Principal Problem:   Acute GI bleeding Active Problems:   Hypertension   DM2 (diabetes mellitus, type 2) (HCC)   Anemia   ESRD (end stage renal disease) ([place])   Gastrointestinal hemorrhage with melena   Duodenal ulcer hemorrhage  Acute Duodenal Ulcer s/p 4/15 endoscopic clip placement: - On Day 2 of IV Protonix 40 mg BID.  Transition to Protonix 40 mg PO BID tomorrow.  Continue x 30 days. - Hold blood thinners. - Follow up on H. Pylori results. - Follow up with [facility] outpatient for repeat EGD in 6 weeks, appreciate.  ESRD on dialysis TTS with left brachiocephalic AVF in LUE with small hematoma formation:  Small traumatic hematoma was sustained on 08/09/20 and fistula was deemed nonfunctional. - Vascular placed dialysis catheter today in the right IJV for access, appreciate. - They will schedule fistulagram  outpatient. - Nephrology is following, appreciate assistance and recommendations.  Anemia of CKD: - Hb 6.6 s/p 1 unit pRBC with dialysis. - There is no acute or active bleeding.  Monitor.  Chronic Hypertension: - BP is stable.  Diabetes Mellitus Type 2: - Glucose is stable. - Continue Lispro SSi with POC glucose q6hrs.  History of Alcohol Use: - Discontinue CIWA protocol.  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  This does not look like bullous disease or PCT or NSF.   - Hold off on skin biopsy as these often show false negatives. - Continue Gabapentin 100 mg daily and capsaicin cream 0.03% to reduce eczema. - Schedule Atarax 25 mg PO TID x 3 days as treatment of dermatosis.  Can consider Doxycycline if does not improve.   - Given the patient's significant pain complaints, history of calciphylaxis and Xray findings, we will start sodium thiosulfate 25 mg TTS in the last hour of dialysis.  Continue other calcium chelators.  Appreciate Nephrology. - There is no evidence of infection and no need for antibiotics. - Uremic pruritis is common as well in ESRD patients - if all therapies fail and if symptoms remain persistent limiting Daniel [name] quality of life, we can treat with 3 days of low dose prednisone.  History of penile calciphylaxis: - Seen by  Urology Dr. Milford Cage and Dermatology in past.  See 04/2020 notes.   Diet Order            Diet renal with fluid restriction Fluid restriction: 1200 mL Fluid; Room service appropriate? Yes; Fluid consistency: Thin  Diet effective now                     Patient's Body mass index is 36.36 kg/m.     DVT prophylaxis: SCDs Start: 08/10/20 0404 Code Status:   Code Status: Full Code  Family  Communication: plan of care discussed with patient at bedside.  Status is: Inpatient  Remains inpatient appropriate because:Inpatient level of care appropriate due to severity of illness   Dispo: The patient is from: Home              Anticipated d/c is to: Home              Patient currently is not medically stable to d/c.  Discharge once renal function stabilizes.   Difficult to place patient No   Unresulted Labs (From admission, onward)          Start     Ordered   08/12/20 1040  Hemoglobin and hematocrit, blood  Once,   R        08/12/20 1040   08/12/20 0757  Hemoglobin and hematocrit, blood  Now then every 8 hours,   R (with TIMED occurrences)     Comments: Does not need the now order just start 12 hours from last hemoglobin draw, call MD for hemoglobin less than 7    08/12/20 0757   08/12/20 0500  CBC  Daily,   R      08/11/20 0825   08/12/20 0500  Parathyroid hormone, intact (no Ca)  Tomorrow morning,   R        08/11/20 1750   08/12/20 0500  Calcium, ionized  Tomorrow morning,   R        08/11/20 1750   08/11/20 1435  H. pylori antigen, stool  Once,   R        08/11/20 1434   08/11/20 3614  Basic metabolic panel  Daily,   R     Question:  Specimen collection method  Answer:  Lab=Lab collect   08/10/20 1146           Medications reviewed:  Scheduled Meds: . sodium chloride   Intravenous Once  . amLODipine  10 mg Oral Daily  . calcium acetate  1,334 mg Oral Q supper  . calcium acetate  667 mg Oral BID WC  . capsaicin   Topical BID  . Chlorhexidine Gluconate Cloth  6 each Topical Daily  . cloNIDine  0.2 mg Oral BID  . [START ON 08/14/2020] darbepoetin (ARANESP) injection - DIALYSIS  150 mcg Intravenous Q Tue-HD  . doxercalciferol  3 mcg Intravenous Q T,Th,Sa-HD  . fentaNYL      . furosemide  40 mg Intravenous Once  . gabapentin  100 mg Oral QHS  . hydrOXYzine  25 mg Oral TID  . insulin aspart  0-9 Units Subcutaneous Q4H  . [START ON 08/14/2020] pantoprazole   40 mg Intravenous Q12H  . sodium zirconium cyclosilicate  10 g Oral Once   Continuous Infusions: . sodium chloride 200 mL/hr at 08/10/20 1604  . sodium chloride    . sodium chloride    . pantoprozole (PROTONIX) infusion 8 mg/hr (08/12/20 0928)  . [START ON 08/14/2020] sodium thiosulfate  infusion for calciphylaxis      Consultants:see note  Procedures:see note  Antimicrobials: Anti-infectives (From admission, onward)   Start     Dose/Rate Route Frequency Ordered Stop   08/10/20 1000  ceFAZolin (ANCEF) IVPB 2g/100 mL premix  Status:  Discontinued        2 g 200 mL/hr over 30 Minutes Intravenous To Short Stay 08/10/20 0716 08/10/20 1114     Culture/Microbiology    Component Value Date/Time   SDES BLOOD SITE NOT SPECIFIED 07/07/2020 1025   SPECREQUEST  07/07/2020 1025    BOTTLES DRAWN AEROBIC AND ANAEROBIC Blood Culture adequate volume   CULT  07/07/2020 1025    NO GROWTH 5 DAYS Performed at [facility], [place] [address] [place], [place] [address]    REPTSTATUS 07/12/2020 FINAL 07/07/2020 1025    Other culture-see note  Objective: Vitals: Today's Vitals   08/12/20 1536 08/12/20 1550 08/12/20 1618 08/12/20 1705  BP: 91/69 (!) 119/42 (!) 144/51 (!) 144/86  Pulse: 82 84 84 87  Resp:   16 18  Temp:   97.7 F (36.5 C) 97.8 F (36.6 C)  TempSrc:   Oral Oral  SpO2:   94% 95%  Weight:   118.2 kg   Height:      PainSc:   0-No pain     Intake/Output Summary (Last 24 hours) at 08/12/2020 1727 Last data filed at 08/12/2020 1618 Gross per 24 hour  Intake 1109.72 ml  Output 4695 ml  Net -3585.28 ml   Filed Weights   08/12/20 0500 08/12/20 1308 08/12/20 1618  Weight: 123.7 kg 122 kg 118.2 kg   Weight change: 4 kg  Intake/Output from previous day: 04/16 0701 - 04/17 0700 In: 4196 [P.O.:595; I.V.:600] Out: 1389 [Urine:800] Intake/Output this shift: Total I/O In: 509.7 [I.V.:200; Blood:299.7; IV Piggyback:10] Out: 2229 [NLGXQ:1194; Blood:5] Filed Weights    08/12/20 0500 08/12/20 1308 08/12/20 1618  Weight: 123.7 kg 122 kg 118.2 kg    Examination:  General exam: AAO ,NAD, weak appearing. HEENT:NCAT, PERRL Respiratory system: bilaterally diminished,no use of accessory muscle, non tender. Cardiovascular system: S1 & S2 +, regular Gastrointestinal system: Abdomen soft, NT,ND, BS+. Nervous System:Alert, awake, moving extremities and grossly nonfocal Extremities: mild peripheral edema, distal peripheral pulses palpable.  Skin: hyperpigmented nodular lesions on lower extremity, pruritis MSK: Normal muscle bulk,tone, power  Data Reviewed: I have personally reviewed following labs and imaging studies CBC: Recent Labs  Lab 08/09/20 1440 08/10/20 0117 08/10/20 0403 08/10/20 0845 08/10/20 1833 08/11/20 0908 08/12/20 0021  WBC 5.8  --  6.2 7.1 7.1 8.0 6.0  NEUTROABS 3.4  --   --   --   --   --   --   HGB 7.1*   < > 7.5* 5.8*  5.4* 7.3* 7.4* 6.6*  HCT 22.8*   < > 24.2* 18.9*  16.0* 22.4* 22.5* 20.2*  MCV 111.8*  --  111.5* 110.5* 100.4* 99.1 100.0  PLT 169  --  122* 128* 119* 120* 105*   < > = values in this interval not displayed.   Basic Metabolic Panel: Recent Labs  Lab 08/09/20 1537 08/10/20 0117 08/10/20 0845 08/11/20 0240 08/12/20 0021  NA 138 136 136 139 140  K 4.7 4.4 4.4 5.2* 4.8  CL 100 101 102 102 102  CO2 21*  --   --  18* 23  GLUCOSE 104* 91 94 83 104*  BUN 79* 101* 114* 134* 123*  CREATININE 10.51* 11.50* 13.10* 12.07* 11.59*  CALCIUM  8.2*  --   --  8.3* 8.2*  PHOS  --   --   --   --  9.9*   GFR: Estimated Creatinine Clearance: 8.9 mL/min (A) (by C-G formula based on SCr of 11.59 mg/dL (H)). Liver Function Tests: Recent Labs  Lab 08/12/20 0021  AST 39  ALT 16  ALKPHOS 38  BILITOT 0.7  PROT 5.9*  ALBUMIN 2.1*   No results for input(s): LIPASE, AMYLASE in the last 168 hours. No results for input(s): AMMONIA in the last 168 hours. Coagulation Profile: Recent Labs  Lab 08/09/20 1524  INR 1.1    CBG: Recent Labs  Lab 08/11/20 2016 08/11/20 2316 08/12/20 0717 08/12/20 0825 08/12/20 1220  GLUCAP 99 103* 115* 80 101*     Recent Results (from the past 240 hour(s))  Resp Panel by RT-PCR (Flu A&B, Covid) Nasopharyngeal Swab     Status: None   Collection Time: 08/10/20  2:58 AM   Specimen: Nasopharyngeal Swab; Nasopharyngeal(NP) swabs in vial transport medium  Result Value Ref Range Status   SARS Coronavirus 2 by RT PCR NEGATIVE NEGATIVE Final    Comment: (NOTE) SARS-CoV-2 target nucleic acids are NOT DETECTED.  The SARS-CoV-2 RNA is generally detectable in upper respiratory specimens during the acute phase of infection. The lowest concentration of SARS-CoV-2 viral copies this assay can detect is 138 copies/mL. A negative result does not preclude SARS-Cov-2 infection and should not be used as the sole basis for treatment or other patient management decisions. A negative result may occur with  improper specimen collection/handling, submission of specimen other than nasopharyngeal swab, presence of viral mutation(s) within the areas targeted by this assay, and inadequate number of viral copies(<138 copies/mL). A negative result must be combined with clinical observations, patient history, and epidemiological information. The expected result is Negative.  Fact Sheet for Patients:  EntrepreneurPulse.com.au  Fact Sheet for Healthcare Providers:  IncredibleEmployment.be  This test is no t yet approved or cleared by the [place] FDA and  has been authorized for detection and/or diagnosis of SARS-CoV-2 by FDA under an Emergency Use Authorization (EUA). This EUA will remain  in effect (meaning this test can be used) for the duration of the COVID-19 declaration under Section 564(b)(1) of the Act, 21 U.S.C.section 360bbb-3(b)(1), unless the authorization is terminated  or revoked sooner.       Influenza A by PCR NEGATIVE NEGATIVE  Final   Influenza B by PCR NEGATIVE NEGATIVE Final    Comment: (NOTE) The Xpert Xpress SARS-CoV-2/FLU/RSV plus assay is intended as an aid in the diagnosis of influenza from Nasopharyngeal swab specimens and should not be used as a sole basis for treatment. Nasal washings and aspirates are unacceptable for Xpert Xpress SARS-CoV-2/FLU/RSV testing.  Fact Sheet for Patients: EntrepreneurPulse.com.au  Fact Sheet for Healthcare Providers: IncredibleEmployment.be  This test is not yet approved or cleared by the [place] FDA and has been authorized for detection and/or diagnosis of SARS-CoV-2 by FDA under an Emergency Use Authorization (EUA). This EUA will remain in effect (meaning this test can be used) for the duration of the COVID-19 declaration under Section 564(b)(1) of the Act, 21 U.S.C. section 360bbb-3(b)(1), unless the authorization is terminated or revoked.  Performed at [facility], [place] [address] [place], [place] [address]      Radiology Studies: DG CHEST PORT 1 VIEW  Result Date: 08/12/2020 CLINICAL DATA:  Status post hemodialysis catheter insertion. EXAM: PORTABLE CHEST 1 VIEW COMPARISON:  08/04/2020 FINDINGS: Patient has  RIGHT-sided internal jugular dialysis catheter, tip overlying the level of the UPPER RIGHT atrium. There is no pneumothorax following line placement. The heart is enlarged and stable in configuration.  Lungs are clear. IMPRESSION: Interval placement of RIGHT internal jugular dialysis catheter. Stable cardiomegaly. Electronically Signed   By: Nolon Nations M.D.   On: 08/12/2020 09:05   DG Ankle Left Port  Result Date: 08/12/2020 CLINICAL DATA:  Evaluate for soft tissue calcifications suggesting renal calcinosis. EXAM: PORTABLE LEFT ANKLE - 2 VIEW COMPARISON:  None. FINDINGS: Extensive vascular atherosclerosis throughout the LEFT lower extremity. No additional soft tissue calcifications. Osseous alignment is  normal. No fracture line or displaced fracture fragment. No acute-appearing cortical irregularity or osseous lesion. Fixation hardware appears appropriately positioned within the calcaneus. IMPRESSION: 1. No acute findings. 2. Extensive vascular calcifications (atherosclerosis) throughout this visualized portion of the LEFT lower extremity. 3. No additional soft tissue calcifications. Electronically Signed   By: Franki Cabot M.D.   On: 08/12/2020 08:04   DG Ankle Right Port  Result Date: 08/11/2020 CLINICAL DATA:  Renal disease with possible soft tissue calcifications. EXAM: PORTABLE RIGHT ANKLE - 2 VIEW COMPARISON:  None. FINDINGS: Bony structures show no acute fracture or dislocation. Diffuse vascular calcifications are noted. No significant soft tissue calcifications are noted that are not vascular in nature. 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 IMPRESSION: Diffuse vascular calcifications. No other acute abnormality is noted. Electronically Signed   By: Inez Catalina M.D.   On: 08/11/2020 19:13   DG Fluoro Guide CV Line-No Report  Result Date: 08/12/2020 Fluoroscopy was utilized by the requesting physician.  No radiographic interpretation.     LOS: 1 day   George Hugh, MD [facility]  08/12/2020, 5:27 PM

## 2020-08-12 NOTE — Progress Notes (Signed)
  Progress Note    08/12/2020 7:02 AM Day of Surgery  Subjective:  No overnight complaints  Vitals:   08/11/20 2147 08/12/20 0400  BP: (!) 161/89 (!) 142/84  Pulse: 77 74  Resp: 20 20  Temp: 97.9 F (36.6 C) 97.9 F (36.6 C)  SpO2: 99% 98%    Physical Exam: aaox3 Non labored respirations Left arm with edema, palpable pulsatility in upper arm fistula  CBC    Component Value Date/Time   WBC 6.0 08/12/2020 0021   RBC 2.02 (L) 08/12/2020 0021   HGB 6.6 (LL) 08/12/2020 0021   HCT 20.2 (L) 08/12/2020 0021   HCT 27.5 (L) 01/23/2019 1014   PLT 105 (L) 08/12/2020 0021   MCV 100.0 08/12/2020 0021   MCH 32.7 08/12/2020 0021   MCHC 32.7 08/12/2020 0021   RDW 21.8 (H) 08/12/2020 0021   LYMPHSABS 2.1 08/09/2020 1440   MONOABS 0.2 08/09/2020 1440   EOSABS 0.0 08/09/2020 1440   BASOSABS 0.1 08/09/2020 1440    BMET    Component Value Date/Time   NA 140 08/12/2020 0021   NA 128 (L) 12/28/2019 1640   K 4.8 08/12/2020 0021   CL 102 08/12/2020 0021   CO2 23 08/12/2020 0021   GLUCOSE 104 (H) 08/12/2020 0021   BUN 123 (H) 08/12/2020 0021   BUN 31 (H) 12/28/2019 1640   CREATININE 11.59 (H) 08/12/2020 0021   CALCIUM 8.2 (L) 08/12/2020 0021   CALCIUM 7.9 (L) 08/01/2019 0702   GFRNONAA 5 (L) 08/12/2020 0021   GFRAA 7 (L) 12/28/2019 1640    INR    Component Value Date/Time   INR 1.1 08/09/2020 1524     Intake/Output Summary (Last 24 hours) at 08/12/2020 0601 Last data filed at 08/12/2020 0600 Gross per 24 hour  Intake 1195 ml  Output 1389 ml  Net -194 ml     Assessment/plan:  60 y.o. male is here with infiltration of his left arm avf, attempted use of fistula yesterday was not successful. Plan for tdc today. Will likely require fistulogram at some time in the future.      Ottavio Norem C. Donzetta Matters, MD [facility] Office: [phone] Pager: [phone]  08/12/2020 7:02 AM

## 2020-08-12 NOTE — Transfer of Care (Signed)
Immediate Anesthesia Transfer of Care Note  Patient: Daniel [name]  Procedure(s) Performed: INSERTION OF Right internal Jugular TUNNELED  DIALYSIS CATHETER. (Right Chest)  Patient Location: PACU  Anesthesia Type:MAC  Level of Consciousness: drowsy and patient cooperative  Airway & Oxygen Therapy: Patient Spontanous Breathing  Post-op Assessment: Report given to RN and Post -op Vital signs reviewed and stable  Post vital signs: Reviewed and stable  Last Vitals:  Vitals Value Taken Time  BP    Temp    Pulse    Resp 14 08/12/20 0822  SpO2    Vitals shown include unvalidated device data.  Last Pain:  Vitals:   08/12/20 0400  TempSrc: Oral  PainSc:       Patients Stated Pain Goal: 0 (52/71/29 2909)  Complications: No complications documented.

## 2020-08-12 NOTE — Op Note (Signed)
    Patient name: LORRIN NAWROT MRN: [id number] DOB: Sep 12, 1960 Sex: male  08/12/2020 Pre-operative Diagnosis: End-stage renal disease, malfunction left arm AV fistula Post-operative diagnosis:  Same Surgeon:  Eda Paschal. Donzetta Matters, MD Procedure Performed:  Ultrasound and fluoroscopic guided placement of right IJ 19 cm tunneled dialysis catheter  Indications: 60 year old male previously on dialysis via left upper arm AV fistula.  He had an infiltration event.  He was subsequently found to be anemic underwent upper endoscopy with clipping of a duodenal ulcer.  The fistula was attempted for use yesterday they could not get significant flow volumes and he has significant pain.  He is now indicated for tunneled dialysis catheter.   Procedure:  The patient was identified in the holding area and taken to the operating was placed supine on upper table and MAC anesthesia was induced.  He was sterilely prepped and draped in the right neck and chest in usual fashion, antibiotics were ministered timeout was called.  Ultrasound was used to identify a very large IJ.  The expected tunnel tract on his neck and chest was anesthetized with 1% lidocaine with epinephrine.  We then cannulated the IJ with 18-gauge needle.  There was very strong backbleeding.  I passed the wire this past into the heart consistent with venous access.  A 19 cm catheter was then tunneled from lateral incision.  We serially dilated the wire tract.  We placed introducer sheath under fluoroscopic guidance.  The catheter was placed to the SVC atrial junction.  Unfortunately initially due to significant scar tissue in his neck there was a bend in the catheter.  We extended our neck incision to approximately 1 cm we dissected out the scar tissue and divided this and the catheter then laid nicely.  We flushed with heparinized saline.  It was locked with 1.6 cc of concentrated heparin either port.  We affixed it to the skin with 3 nylon suture.  We closed  the neck incision with 4 Monocryl.  Dermabond is placed at both sites.  Sterile dressing was placed.  He was awakened from anesthesia having tolerated procedure without any complication.  All counts were correct at completion.  EBL: 10 cc   Taytum Scheck C. Donzetta Matters, MD [facility] Office: [phone] Pager: [phone]

## 2020-08-12 NOTE — Anesthesia Postprocedure Evaluation (Signed)
Anesthesia Post Note  Patient: Daniel [name]  Procedure(s) Performed: INSERTION OF Right internal Jugular TUNNELED  DIALYSIS CATHETER. (Right Chest)     Patient location during evaluation: PACU Anesthesia Type: General Level of consciousness: awake Pain management: pain level controlled Vital Signs Assessment: post-procedure vital signs reviewed and stable Respiratory status: spontaneous breathing Cardiovascular status: stable Postop Assessment: no apparent nausea or vomiting Anesthetic complications: no   No complications documented.  Last Vitals:  Vitals:   08/12/20 0822 08/12/20 0837  BP: (!) 144/77 (!) 149/81  Pulse: 86 87  Resp: 14 15  Temp: (!) 36.1 C   SpO2: 100% 100%    Last Pain:  Vitals:   08/12/20 0837  TempSrc:   PainSc: 4                  Ronald Londo

## 2020-08-12 NOTE — Progress Notes (Signed)
Patient ID: Daniel [name], male   DOB: July 14, 1960, 60 y.o.   MRN: [id number]    Progress Note   Subjective   Day # 3  CC; GI bleed, duodenal ulcer with visible vessel  PPI infusion  Hemoglobin 7.4/down to 6.6 today H. pylori stool antigen pending  No evidence of active bleeding, no stools  Patient had right IJ tunneled dialysis catheter placed earlier this morning  No complaints of abdominal pain, he is hungry, complaining of severe left upper extremity pain     Objective   Vital signs in last 24 hours: Temp:  [97 F (36.1 C)-98.2 F (36.8 C)] 97.4 F (36.3 C) (04/17 0923) Pulse Rate:  [74-88] 85 (04/17 0923) Resp:  [14-20] 19 (04/17 0923) BP: (126-161)/(74-89) 135/74 (04/17 0923) SpO2:  [92 %-100 %] 92 % (04/17 0923) Weight:  [122.1 kg-123.7 kg] 123.7 kg (04/17 0500) Last BM Date: 08/10/20 General:    Chronically ill-appearing African-American male in NAD Heart:  Regular rate and rhythm; no murmurs Lungs: Respirations even and unlabored, lungs CTA bilaterally Abdomen:  Soft, obese, nontender and nondistended. Normal bowel sounds. Extremities:  Without edema. Neurologic:  Alert and oriented,  grossly normal neurologically. Psych:  Cooperative. Normal mood and affect.  Intake/Output from previous day: 04/16 0701 - 04/17 0700 In: 1195 [P.O.:595; I.V.:600] Out: 1389 [Urine:800] Intake/Output this shift: Total I/O In: 210 [I.V.:200; IV Piggyback:10] Out: 5 [Blood:5]  Lab Results: Recent Labs    08/10/20 1833 08/11/20 0908 08/12/20 0021  WBC 7.1 8.0 6.0  HGB 7.3* 7.4* 6.6*  HCT 22.4* 22.5* 20.2*  PLT 119* 120* 105*   BMET Recent Labs    08/09/20 1537 08/10/20 0117 08/10/20 0845 08/11/20 0240 08/12/20 0021  NA 138   < > 136 139 140  K 4.7   < > 4.4 5.2* 4.8  CL 100   < > 102 102 102  CO2 21*  --   --  18* 23  GLUCOSE 104*   < > 94 83 104*  BUN 79*   < > 114* 134* 123*  CREATININE 10.51*   < > 13.10* 12.07* 11.59*  CALCIUM 8.2*  --   --  8.3*  8.2*   < > = values in this interval not displayed.   LFT Recent Labs    08/12/20 0021  PROT 5.9*  ALBUMIN 2.1*  AST 39  ALT 16  ALKPHOS 38  BILITOT 0.7  BILIDIR 0.2  IBILI 0.5   PT/INR Recent Labs    08/09/20 1524  LABPROT 14.0  INR 1.1    Studies/Results: DG CHEST PORT 1 VIEW  Result Date: 08/12/2020 CLINICAL DATA:  Status post hemodialysis catheter insertion. EXAM: PORTABLE CHEST 1 VIEW COMPARISON:  08/04/2020 FINDINGS: Patient has RIGHT-sided internal jugular dialysis catheter, tip overlying the level of the UPPER RIGHT atrium. There is no pneumothorax following line placement. The heart is enlarged and stable in configuration.  Lungs are clear. IMPRESSION: Interval placement of RIGHT internal jugular dialysis catheter. Stable cardiomegaly. Electronically Signed   By: Nolon Nations M.D.   On: 08/12/2020 09:05   DG Ankle Left Port  Result Date: 08/12/2020 CLINICAL DATA:  Evaluate for soft tissue calcifications suggesting renal calcinosis. EXAM: PORTABLE LEFT ANKLE - 2 VIEW COMPARISON:  None. FINDINGS: Extensive vascular atherosclerosis throughout the LEFT lower extremity. No additional soft tissue calcifications. Osseous alignment is normal. No fracture line or displaced fracture fragment. No acute-appearing cortical irregularity or osseous lesion. Fixation hardware appears appropriately positioned within the calcaneus. IMPRESSION:  1. No acute findings. 2. Extensive vascular calcifications (atherosclerosis) throughout this visualized portion of the LEFT lower extremity. 3. No additional soft tissue calcifications. Electronically Signed   By: Franki Cabot M.D.   On: 08/12/2020 08:04   DG Ankle Right Port  Result Date: 08/11/2020 CLINICAL DATA:  Renal disease with possible soft tissue calcifications. EXAM: PORTABLE RIGHT ANKLE - 2 VIEW COMPARISON:  None. FINDINGS: Bony structures show no acute fracture or dislocation. Diffuse vascular calcifications are noted. No significant  soft tissue calcifications are noted that are not vascular in nature. 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 IMPRESSION: Diffuse vascular calcifications. No other acute abnormality is noted. Electronically Signed   By: Inez Catalina M.D.   On: 08/11/2020 19:13   DG Fluoro Guide CV Line-No Report  Result Date: 08/12/2020 Fluoroscopy was utilized by the requesting physician.  No radiographic interpretation.       Assessment / Plan:    #37  60 year old African-American male with end-stage renal disease on dialysis admitted with melena, anemia and left upper extremity pain which has been progressive. Hemoglobin 5.8 on admission  EGD with finding of a duodenal ulcer actively bleeding with visible vessel which was treated with hemoclips for hemostasis This is in an area of surrounding nodularity and polypoid mucosa which was not biopsied due to active bleeding  Patient has not had further active bleeding however hemoglobin has drifted again post transfusions, hemoglobin 6.6 this a.m.-suspect this is multifactorial   Plan; transfuse to keep hemoglobin in the 7 range Complete PPI infusion today then switch to twice daily PPI  Advance diet to renal  Our office will arrange follow-up office appointment, will need repeat EGD in 2 months with Dr. 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  GI will sign off, available if needed, please call for problems       Principal Problem:   Acute GI bleeding Active Problems:   Hypertension   DM2 (diabetes mellitus, type 2) ([place])   Anemia   ESRD (end stage renal disease) ([place])   Gastrointestinal hemorrhage with melena   Duodenal ulcer hemorrhage     LOS: 1 day   Sesar Madewell PA-C 08/12/2020, 12:40 PM

## 2020-08-12 NOTE — Anesthesia Procedure Notes (Signed)
Procedure Name: MAC Date/Time: 08/12/2020 7:40 AM Performed by: Amadeo Garnet, CRNA Pre-anesthesia Checklist: Patient identified, Emergency Drugs available, Suction available and Patient being monitored Patient Re-evaluated:Patient Re-evaluated prior to induction Oxygen Delivery Method: Simple face mask Preoxygenation: Pre-oxygenation with 100% oxygen Induction Type: IV induction Placement Confirmation: positive ETCO2 Dental Injury: Teeth and Oropharynx as per pre-operative assessment

## 2020-08-12 NOTE — Progress Notes (Signed)
Taken to Pre-Op at this time. Jefferson Fuel, RN  08/12/20 [phone]

## 2020-08-12 NOTE — Plan of Care (Signed)
°  Problem: Clinical Measurements: °Goal: Diagnostic test results will improve °Outcome: Progressing °  °Problem: Activity: °Goal: Risk for activity intolerance will decrease °Outcome: Progressing °  °Problem: Coping: °Goal: Level of anxiety will decrease °Outcome: Progressing °  °Problem: Pain Managment: °Goal: General experience of comfort will improve °Outcome: Progressing °  °

## 2020-08-12 NOTE — Anesthesia Postprocedure Evaluation (Signed)
Anesthesia Post Note  Patient: Daniel [name]  Procedure(s) Performed: INSERTION OF Right internal Jugular TUNNELED  DIALYSIS CATHETER. (Right Chest)     Patient location during evaluation: PACU Anesthesia Type: MAC Level of consciousness: awake Pain management: pain level controlled Vital Signs Assessment: post-procedure vital signs reviewed and stable Respiratory status: spontaneous breathing Cardiovascular status: stable Postop Assessment: no apparent nausea or vomiting Anesthetic complications: no   No complications documented.  Last Vitals:  Vitals:   08/12/20 0852 08/12/20 0923  BP: (!) 145/74 135/74  Pulse: 88 85  Resp: 19 19  Temp: 36.7 C (!) 36.3 C  SpO2: 100% 92%    Last Pain:  Vitals:   08/12/20 0923  TempSrc: Oral  PainSc:                  Daniel [name]

## 2020-08-12 NOTE — Anesthesia Preprocedure Evaluation (Addendum)
Anesthesia Evaluation  Patient identified by MRN, date of birth, ID band Patient awake    Reviewed: Allergy & Precautions, NPO status , Patient's Chart, lab work & pertinent test results  Airway Mallampati: II  TM Distance: >3 FB     Dental   Pulmonary Current Smoker and Patient abstained from smoking.,    breath sounds clear to auscultation       Cardiovascular hypertension,  Rhythm:Regular Rate:Normal     Neuro/Psych PSYCHIATRIC DISORDERS    GI/Hepatic PUD, (+) Hepatitis -  Endo/Other  diabetes  Renal/GU ESRFRenal disease     Musculoskeletal   Abdominal   Peds  Hematology  (+) anemia ,   Anesthesia Other Findings   Reproductive/Obstetrics                             Anesthesia Physical Anesthesia Plan  ASA: III  Anesthesia Plan: MAC   Post-op Pain Management:    Induction: Intravenous  PONV Risk Score and Plan: 2 and Midazolam, Ondansetron and Dexamethasone  Airway Management Planned: Simple Face Mask  Additional Equipment:   Intra-op Plan:   Post-operative Plan:   Informed Consent: I have reviewed the patients History and Physical, chart, labs and discussed the procedure including the risks, benefits and alternatives for the proposed anesthesia with the patient or authorized representative who has indicated his/her understanding and acceptance.     Dental advisory given  Plan Discussed with: CRNA and Anesthesiologist  Anesthesia Plan Comments:        Anesthesia Quick Evaluation

## 2020-08-13 ENCOUNTER — Encounter (HOSPITAL_COMMUNITY): Payer: Self-pay | Admitting: Anesthesiology

## 2020-08-13 ENCOUNTER — Encounter (HOSPITAL_COMMUNITY): Payer: Self-pay | Admitting: Vascular Surgery

## 2020-08-13 LAB — GLUCOSE, CAPILLARY
Glucose-Capillary: 72 mg/dL (ref 70–99)
Glucose-Capillary: 116 mg/dL — ABNORMAL HIGH (ref 70–99)
Glucose-Capillary: 116 mg/dL — ABNORMAL HIGH (ref 70–99)
Glucose-Capillary: 133 mg/dL — ABNORMAL HIGH (ref 70–99)
Glucose-Capillary: 131 mg/dL — ABNORMAL HIGH (ref 70–99)
Glucose-Capillary: 114 mg/dL — ABNORMAL HIGH (ref 70–99)

## 2020-08-13 LAB — BASIC METABOLIC PANEL
Anion gap: 11 (ref 5–15)
BUN: 68 mg/dL — ABNORMAL HIGH (ref 6–20)
CO2: 25 mmol/L (ref 22–32)
Calcium: 8.2 mg/dL — ABNORMAL LOW (ref 8.9–10.3)
Chloride: 101 mmol/L (ref 98–111)
Creatinine, Ser: 9.15 mg/dL — ABNORMAL HIGH (ref 0.61–1.24)
GFR, Estimated: 6 mL/min — ABNORMAL LOW (ref >60)
Glucose, Bld: 135 mg/dL — ABNORMAL HIGH (ref 70–99)
Potassium: 3.9 mmol/L (ref 3.5–5.1)
Sodium: 137 mmol/L (ref 135–145)

## 2020-08-13 LAB — CALCIUM, IONIZED: Calcium, Ionized, Serum: 4.4 mg/dL — ABNORMAL LOW (ref 4.5–5.6)

## 2020-08-13 LAB — HEMOGLOBIN AND HEMATOCRIT, BLOOD
HCT: 25.3 % — ABNORMAL LOW (ref 39.0–52.0)
HCT: 24.3 % — ABNORMAL LOW (ref 39.0–52.0)
Hemoglobin: 8.1 g/dL — ABNORMAL LOW (ref 13.0–17.0)
Hemoglobin: 7.9 g/dL — ABNORMAL LOW (ref 13.0–17.0)

## 2020-08-13 LAB — CBC
HCT: 24.5 % — ABNORMAL LOW (ref 39.0–52.0)
Hemoglobin: 8.1 g/dL — ABNORMAL LOW (ref 13.0–17.0)
MCH: 32.9 pg (ref 26.0–34.0)
MCHC: 33.1 g/dL (ref 30.0–36.0)
MCV: 99.6 fL (ref 80.0–100.0)
Platelets: 104 10*3/uL — ABNORMAL LOW (ref 150–400)
RBC: 2.46 MIL/uL — ABNORMAL LOW (ref 4.22–5.81)
RDW: 21.5 % — ABNORMAL HIGH (ref 11.5–15.5)
WBC: 5.3 10*3/uL (ref 4.0–10.5)
nRBC: 0.4 % — ABNORMAL HIGH (ref 0.0–0.2)

## 2020-08-13 MED ORDER — LANTHANUM CARBONATE 500 MG PO CHEW
1000.0000 mg | CHEWABLE_TABLET | Freq: Three times a day (TID) | ORAL | Status: DC
  Administered 2020-08-13 – 2020-08-16 (×6): 1000 mg via ORAL
  Filled 2020-08-13 (×6): qty 2

## 2020-08-13 MED ORDER — HYDROXYZINE HCL 25 MG PO TABS
25.0000 mg | ORAL_TABLET | Freq: Three times a day (TID) | ORAL | Status: DC | PRN
  Administered 2020-08-14 (×2): 25 mg via ORAL
  Filled 2020-08-13 (×2): qty 1

## 2020-08-13 MED ORDER — PANTOPRAZOLE SODIUM 40 MG PO TBEC
40.0000 mg | DELAYED_RELEASE_TABLET | Freq: Two times a day (BID) | ORAL | Status: DC
  Administered 2020-08-13 – 2020-08-16 (×7): 40 mg via ORAL
  Filled 2020-08-13 (×7): qty 1

## 2020-08-13 MED ORDER — PANTOPRAZOLE SODIUM 40 MG PO TBEC: 40.0000 mg | DELAYED_RELEASE_TABLET | Freq: Two times a day (BID) | ORAL | Status: DC

## 2020-08-13 MED ORDER — CHLORHEXIDINE GLUCONATE CLOTH 2 % EX PADS: 6.0000 | MEDICATED_PAD | Freq: Every day | CUTANEOUS | Status: DC

## 2020-08-13 MED ORDER — HYDROMORPHONE HCL 1 MG/ML IJ SOLN
0.2500 mg | Freq: Every day | INTRAMUSCULAR | Status: AC | PRN
  Administered 2020-08-14: 0.25 mg via INTRAVENOUS
  Filled 2020-08-13: qty 1

## 2020-08-13 NOTE — Progress Notes (Signed)
PROGRESS NOTE    Daniel [name]  HYI:[id number] DOB: 02/12/1961 DOA: 08/09/2020 PCP: [name], NP   Chief Complaint  Patient presents with  . 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  EGD showed actively bleeding 9 mm duodenal ulcer and 3 clips were placed.   Nephrology is following patient regarding dialysis.  On 08/03/2019, patient had a AV fistula placed in the left arm by Vascular Dr. Donzetta Matters that was complicated by a small traumatic hematoma formation that was deemed nonfunctional.  On 4/17, Vascular placed tunneled dialysis catheter in the right IJV.  Subjective: Patient received Morphine overnight and is drowsy this morning. Morphine should be avoided in ESRD patients.  He can receive IV Dilaudid 0.25 mg PRN moving forward.  Signout was updated. Patient's itching has improved with Atarax - I will change to PRN. [place] is stable at 11/27/22.  There is no acute bleeding.   Assessment & Plan: Principal Problem:   Acute GI bleeding Active Problems:   Hypertension   DM2 (diabetes mellitus, type 2) ([facility])   Anemia   ESRD (end stage renal disease) ([place])   Gastrointestinal hemorrhage with melena   Duodenal ulcer hemorrhage  Acute Duodenal Ulcer s/p 4/15 endoscopic clip placement: - S/P 72 hours of of IV Protonix 40 mg BID.  Start Protonix 40 mg PO BID.  Continue x 30 days. - Hold blood thinners. - Follow up on H. Pylori results. - Follow up with [facility] outpatient for repeat EGD in 6 weeks, appreciate.  ESRD on dialysis TTS with left brachiocephalic AVF in LUE with small hematoma formation:  On 4/14, a small traumatic hematoma was sustained and fistula was deemed nonfunctional. - On 4/17, Vascular placed dialysis catheter today in the right IJV for access, appreciate. - They will schedule fistulagram outpatient. -  Continue dialysis TTS.  Nephrology is following, appreciate assistance and recommendations.  Anemia of CKD: - 4/17 Hb 6.6 s/p 1 unit pRBC with dialysis. - HH is now stable. - There is no acute or active bleeding. - Continue ESA with dialysis.  Chronic Hypertension: - BP is stable.  Diabetes Mellitus Type 2: - Glucose is stable. - Continue Lispro SSi with POC glucose q6hrs.  History of Alcohol Use: - Counseled on abstinence.  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  This does not look like bullous disease or PCT or NSF.   - Hold off on skin biopsy as these often show false negatives. - Continue Gabapentin 100 mg daily for pain complaints and pruritis.  Discontinue capsaicin cream as this was not effect.  Continue sarna for moisturization.. - Continue Atarax 25 mg PO TID PRN for pruritis and as treatment of dermatosis.  Consider Doxycycline if does not improve.   - Given the patient's significant pain complaints, history of calciphylaxis and Xray findings, start sodium thiosulfate 25 mg in the last hour of dialysis on TTS.  Monitor for improvement.  Continue other calcium chelators.  Appreciate Nephrology. - There is no evidence of infection and no need for antibiotics. - Uremic pruritis is common as well in ESRD patients - if all  therapies fail and if symptoms remain persistent limiting Mr. Ruddy quality of life, we can manage with 3 days of low dose prednisone which will help relieve symptoms.  History of penile calciphylaxis: - Seen by Urology Dr. Milford Cage and Dermatology in past.  See 04/2020 notes.   Diet Order            Diet renal with fluid restriction Fluid restriction: 1200 mL Fluid; Room service appropriate? Yes; Fluid consistency:  Thin  Diet effective now                 Patient's Body mass index is 36.42 kg/m.  DVT prophylaxis: SCDs Start: 08/10/20 0404 Code Status:   Code Status: Full Code  Family Communication: plan of care discussed with patient at bedside.  Status is: Inpatient  Remains inpatient appropriate because:Inpatient level of care appropriate due to severity of illness   Dispo: The patient is from: Home              Anticipated d/c is to: Home              Patient currently is not medically stable to d/c.  Discharge once renal function stabilizes, per Nephrology.   Difficult to place patient No   Unresulted Labs (From admission, onward)          Start     Ordered   08/12/20 0757  Hemoglobin and hematocrit, blood  Now then every 8 hours,   R (with TIMED occurrences)     Comments: Does not need the now order just start 12 hours from last hemoglobin draw, call MD for hemoglobin less than 7    08/12/20 0757   08/12/20 0500  CBC  Daily,   R      08/11/20 0825   08/12/20 0500  Parathyroid hormone, intact (no Ca)  Tomorrow morning,   R        08/11/20 1750   08/11/20 1435  H. pylori antigen, stool  Once,   R        08/11/20 1434   Signed and Held  Renal function panel  Once,   R        Signed and Held   Signed and Held  CBC  Once,   R        Signed and Held           Medications reviewed:  Scheduled Meds: . sodium chloride   Intravenous Once  . amLODipine  10 mg Oral Daily  . Chlorhexidine Gluconate Cloth  6 each Topical Daily  . Chlorhexidine Gluconate Cloth  6 each Topical Q0600  . cloNIDine  0.2 mg Oral BID  . [START ON 08/14/2020] darbepoetin (ARANESP) injection - DIALYSIS  150 mcg Intravenous Q Tue-HD  . gabapentin  100 mg Oral QHS  . insulin aspart  0-9 Units Subcutaneous Q4H  . lanthanum  1,000 mg Oral TID WC  . pantoprazole  40 mg Oral BID  . sodium zirconium cyclosilicate  10 g Oral Once   Continuous Infusions: . sodium chloride 200 mL/hr at 08/10/20 1604  . sodium  chloride    . sodium chloride    . [START ON 08/14/2020] sodium thiosulfate infusion for calciphylaxis      Consultants:see note  Procedures:see note  Antimicrobials: Anti-infectives (From admission, onward)   Start     Dose/Rate Route Frequency Ordered Stop   08/10/20 1000  ceFAZolin (ANCEF) IVPB 2g/100 mL premix  Status:  Discontinued  2 g 200 mL/hr over 30 Minutes Intravenous To Short Stay 08/10/20 0716 08/10/20 1114     Culture/Microbiology    Component Value Date/Time   SDES BLOOD SITE NOT SPECIFIED 07/07/2020 1025   SPECREQUEST  07/07/2020 1025    BOTTLES DRAWN AEROBIC AND ANAEROBIC Blood Culture adequate volume   CULT  07/07/2020 1025    NO GROWTH 5 DAYS Performed at [facility], [place] [address] [place],  [address]    REPTSTATUS 07/12/2020 FINAL 07/07/2020 1025    Other culture-see note  Objective: Vitals: Today's Vitals   08/13/20 0453 08/13/20 0457 08/13/20 1000 08/13/20 1403  BP:  103/83  (!) 112/54  Pulse:  79  74  Resp:  19  20  Temp:  98.2 F (36.8 C)  97.9 F (36.6 C)  TempSrc:  Oral  Oral  SpO2:  94%  97%  Weight: 118.4 kg     Height:      PainSc:   0-No pain     Intake/Output Summary (Last 24 hours) at 08/13/2020 1448 Last data filed at 08/13/2020 0600 Gross per 24 hour  Intake 129.11 ml  Output 4390 ml  Net -4260.89 ml   Filed Weights   08/12/20 1308 08/12/20 1618 08/13/20 0453  Weight: 122 kg 118.2 kg 118.4 kg   Weight change: -2.2 kg  Intake/Output from previous day: 04/17 0701 - 04/18 0700 In: 638.8 [I.V.:329.1; Blood:299.7; IV Piggyback:10] Out: 8469 [Urine:500; Blood:5] Intake/Output this shift: No intake/output data recorded. Filed Weights   08/12/20 1308 08/12/20 1618 08/13/20 0453  Weight: 122 kg 118.2 kg 118.4 kg    Examination:  General exam: AAO ,NAD, weak appearing. HEENT:NCAT, PERRL Respiratory system: bilaterally diminished,no use of accessory muscle, non tender. Cardiovascular system: S1 & S2  +, regular Gastrointestinal system: Abdomen soft, NT,ND, BS+. Nervous System:Alert, awake, moving extremities and grossly nonfocal Extremities: mild peripheral edema, distal peripheral pulses palpable.  Skin: hyperpigmented nodular lesions on lower extremity, pruritis MSK: Normal muscle bulk,tone, power  Data Reviewed: I have personally reviewed following labs and imaging studies CBC: Recent Labs  Lab 08/09/20 1440 08/10/20 0117 08/10/20 0845 08/10/20 1833 08/11/20 0908 08/12/20 0021 08/12/20 2152 08/13/20 0808  WBC 5.8   < > 7.1 7.1 8.0 6.0  --  5.3  NEUTROABS 3.4  --   --   --   --   --   --   --   HGB 7.1*   < > 5.8*  5.4* 7.3* 7.4* 6.6* 8.3* 8.1*  8.1*  HCT 22.8*   < > 18.9*  16.0* 22.4* 22.5* 20.2* 25.4* 24.5*  25.3*  MCV 111.8*   < > 110.5* 100.4* 99.1 100.0  --  99.6  PLT 169   < > 128* 119* 120* 105*  --  104*   < > = values in this interval not displayed.   Basic Metabolic Panel: Recent Labs  Lab 08/09/20 1537 08/10/20 0117 08/10/20 0845 08/11/20 0240 08/12/20 0021 08/13/20 0808  NA 138 136 136 139 140 137  K 4.7 4.4 4.4 5.2* 4.8 3.9  CL 100 101 102 102 102 101  CO2 21*  --   --  18* 23 25  GLUCOSE 104* 91 94 83 104* 135*  BUN 79* 101* 114* 134* 123* 68*  CREATININE 10.51* 11.50* 13.10* 12.07* 11.59* 9.15*  CALCIUM 8.2*  --   --  8.3* 8.2* 8.2*  PHOS  --   --   --   --  9.9*  --  GFR: Estimated Creatinine Clearance: 11.2 mL/min (A) (by C-G formula based on SCr of 9.15 mg/dL (H)). Liver Function Tests: Recent Labs  Lab 08/12/20 0021  AST 39  ALT 16  ALKPHOS 38  BILITOT 0.7  PROT 5.9*  ALBUMIN 2.1*   No results for input(s): LIPASE, AMYLASE in the last 168 hours. No results for input(s): AMMONIA in the last 168 hours. Coagulation Profile: Recent Labs  Lab 08/09/20 1524  INR 1.1   CBG: Recent Labs  Lab 08/12/20 2118 08/12/20 2346 08/13/20 0326 08/13/20 0738 08/13/20 1125  GLUCAP 102* 136* 116* 116* 133*     Recent Results  (from the past 240 hour(s))  Resp Panel by RT-PCR (Flu A&B, Covid) Nasopharyngeal Swab     Status: None   Collection Time: 08/10/20  2:58 AM   Specimen: Nasopharyngeal Swab; Nasopharyngeal(NP) swabs in vial transport medium  Result Value Ref Range Status   SARS Coronavirus 2 by RT PCR NEGATIVE NEGATIVE Final    Comment: (NOTE) SARS-CoV-2 target nucleic acids are NOT DETECTED.  The SARS-CoV-2 RNA is generally detectable in upper respiratory specimens during the acute phase of infection. The lowest concentration of SARS-CoV-2 viral copies this assay can detect is 138 copies/mL. A negative result does not preclude SARS-Cov-2 infection and should not be used as the sole basis for treatment or other patient management decisions. A negative result may occur with  improper specimen collection/handling, submission of specimen other than nasopharyngeal swab, presence of viral mutation(s) within the areas targeted by this assay, and inadequate number of viral copies(<138 copies/mL). A negative result must be combined with clinical observations, patient history, and epidemiological information. The expected result is Negative.  Fact Sheet for Patients:  EntrepreneurPulse.com.au  Fact Sheet for Healthcare Providers:  IncredibleEmployment.be  This test is no t yet approved or cleared by the [place] FDA and  has been authorized for detection and/or diagnosis of SARS-CoV-2 by FDA under an Emergency Use Authorization (EUA). This EUA will remain  in effect (meaning this test can be used) for the duration of the COVID-19 declaration under Section 564(b)(1) of the Act, 21 U.S.C.section 360bbb-3(b)(1), unless the authorization is terminated  or revoked sooner.       Influenza A by PCR NEGATIVE NEGATIVE Final   Influenza B by PCR NEGATIVE NEGATIVE Final    Comment: (NOTE) The Xpert Xpress SARS-CoV-2/FLU/RSV plus assay is intended as an aid in the  diagnosis of influenza from Nasopharyngeal swab specimens and should not be used as a sole basis for treatment. Nasal washings and aspirates are unacceptable for Xpert Xpress SARS-CoV-2/FLU/RSV testing.  Fact Sheet for Patients: EntrepreneurPulse.com.au  Fact Sheet for Healthcare Providers: IncredibleEmployment.be  This test is not yet approved or cleared by the [place] FDA and has been authorized for detection and/or diagnosis of SARS-CoV-2 by FDA under an Emergency Use Authorization (EUA). This EUA will remain in effect (meaning this test can be used) for the duration of the COVID-19 declaration under Section 564(b)(1) of the Act, 21 U.S.C. section 360bbb-3(b)(1), unless the authorization is terminated or revoked.  Performed at [facility], [place] [address] [place], [place] [address]      Radiology Studies: DG CHEST PORT 1 VIEW  Result Date: 08/12/2020 CLINICAL DATA:  Status post hemodialysis catheter insertion. EXAM: PORTABLE CHEST 1 VIEW COMPARISON:  08/04/2020 FINDINGS: Patient has RIGHT-sided internal jugular dialysis catheter, tip overlying the level of the UPPER RIGHT atrium. There is no pneumothorax following line placement. The heart is enlarged and  stable in configuration.  Lungs are clear. IMPRESSION: Interval placement of RIGHT internal jugular dialysis catheter. Stable cardiomegaly. Electronically Signed   By: Nolon Nations M.D.   On: 08/12/2020 09:05   DG Ankle Left Port  Result Date: 08/12/2020 CLINICAL DATA:  Evaluate for soft tissue calcifications suggesting renal calcinosis. EXAM: PORTABLE LEFT ANKLE - 2 VIEW COMPARISON:  None. FINDINGS: Extensive vascular atherosclerosis throughout the LEFT lower extremity. No additional soft tissue calcifications. Osseous alignment is normal. No fracture line or displaced fracture fragment. No acute-appearing cortical irregularity or osseous lesion. Fixation hardware appears  appropriately positioned within the calcaneus. IMPRESSION: 1. No acute findings. 2. Extensive vascular calcifications (atherosclerosis) throughout this visualized portion of the LEFT lower extremity. 3. No additional soft tissue calcifications. Electronically Signed   By: Franki Cabot M.D.   On: 08/12/2020 08:04   DG Ankle Right Port  Result Date: 08/11/2020 CLINICAL DATA:  Renal disease with possible soft tissue calcifications. EXAM: PORTABLE RIGHT ANKLE - 2 VIEW COMPARISON:  None. FINDINGS: Bony structures show no acute fracture or dislocation. Diffuse vascular calcifications are noted. No significant soft tissue calcifications are noted that are not vascular in nature. 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 IMPRESSION: Diffuse vascular calcifications. No other acute abnormality is noted. Electronically Signed   By: Inez Catalina M.D.   On: 08/11/2020 19:13   DG Fluoro Guide CV Line-No Report  Result Date: 08/12/2020 Fluoroscopy was utilized by the requesting physician.  No radiographic interpretation.     LOS: 2 days   George Hugh, MD [facility]  08/13/2020, 2:48 PM

## 2020-08-13 NOTE — Progress Notes (Signed)
[facility] Progress Note   Subjective:   Reports he has felt more weak since dialysis yesterday, usually not a problem for him with dialysis despite high UF goals. Appears BP was stable during dialysis, he reports he slept through HD with no issues.  Also reports feet feel heavy and "like lead." Denies SOB, CP, palpitations, dizziness, abdominal pain and nausea.   Objective Vitals:   08/12/20 1746 08/12/20 2123 08/13/20 0453 08/13/20 0457  BP: (!) 154/94 (!) 145/74  103/83  Pulse: 87 88  79  Resp: 16 16  19   Temp: 98.4 F (36.9 C) 98.9 F (37.2 C)  98.2 F (36.8 C)  TempSrc: Oral Oral  Oral  SpO2: 94% 96%  94%  Weight:   118.4 kg   Height:       Physical Exam General: WDWN male, alert and in NAD Heart: RRR, no murmurs, rubs or gallops Lungs: CTA bilaterally without wheezing, rhonchi or rales Abdomen: Soft, non-tender, non-distended, +BS Extremities: scattered hyperpigmented nodules b/l lower extremities, scaly/peeling skin noted bottom of R foot Dialysis Access: R IJ TDC, LUE AVF + bruit  Additional Objective Labs: Basic Metabolic Panel: Recent Labs  Lab 08/11/20 0240 08/12/20 0021 08/13/20 0808  NA 139 140 137  K 5.2* 4.8 3.9  CL 102 102 101  CO2 18* 23 25  GLUCOSE 83 104* 135*  BUN 134* 123* 68*  CREATININE 12.07* 11.59* 9.15*  CALCIUM 8.3* 8.2* 8.2*  PHOS  --  9.9*  --    Liver Function Tests: Recent Labs  Lab 08/12/20 0021  AST 39  ALT 16  ALKPHOS 38  BILITOT 0.7  PROT 5.9*  ALBUMIN 2.1*   No results for input(s): LIPASE, AMYLASE in the last 168 hours. CBC: Recent Labs  Lab 08/09/20 1440 08/10/20 0117 08/10/20 0845 08/10/20 1833 08/11/20 0908 08/12/20 0021 08/12/20 2152 08/13/20 0808  WBC 5.8   < > 7.1 7.1 8.0 6.0  --  5.3  NEUTROABS 3.4  --   --   --   --   --   --   --   HGB 7.1*   < > 5.8*  5.4* 7.3* 7.4* 6.6* 8.3* 8.1*  8.1*  HCT 22.8*   < > 18.9*  16.0* 22.4* 22.5* 20.2* 25.4* 24.5*  25.3*  MCV 111.8*   < > 110.5*  100.4* 99.1 100.0  --  99.6  PLT 169   < > 128* 119* 120* 105*  --  104*   < > = values in this interval not displayed.   Blood Culture    Component Value Date/Time   SDES BLOOD SITE NOT SPECIFIED 07/07/2020 1025   SPECREQUEST  07/07/2020 1025    BOTTLES DRAWN AEROBIC AND ANAEROBIC Blood Culture adequate volume   CULT  07/07/2020 1025    NO GROWTH 5 DAYS Performed at [facility], [place] [address] [place], [place] [address]    REPTSTATUS 07/12/2020 FINAL 07/07/2020 1025    Cardiac Enzymes: No results for input(s): CKTOTAL, CKMB, CKMBINDEX, TROPONINI in the last 168 hours. CBG: Recent Labs  Lab 08/12/20 1734 08/12/20 2118 08/12/20 2346 08/13/20 0326 08/13/20 0738  GLUCAP 154* 102* 136* 116* 116*   Iron Studies: No results for input(s): IRON, TIBC, TRANSFERRIN, FERRITIN in the last 72 hours. @lablastinr3 @ Studies/Results: DG CHEST PORT 1 VIEW  Result Date: 08/12/2020 CLINICAL DATA:  Status post hemodialysis catheter insertion. EXAM: PORTABLE CHEST 1 VIEW COMPARISON:  08/04/2020 FINDINGS: Patient has RIGHT-sided internal jugular dialysis catheter, tip  overlying the level of the UPPER RIGHT atrium. There is no pneumothorax following line placement. The heart is enlarged and stable in configuration.  Lungs are clear. IMPRESSION: Interval placement of RIGHT internal jugular dialysis catheter. Stable cardiomegaly. Electronically Signed   By: Nolon Nations M.D.   On: 08/12/2020 09:05   DG Ankle Left Port  Result Date: 08/12/2020 CLINICAL DATA:  Evaluate for soft tissue calcifications suggesting renal calcinosis. EXAM: PORTABLE LEFT ANKLE - 2 VIEW COMPARISON:  None. FINDINGS: Extensive vascular atherosclerosis throughout the LEFT lower extremity. No additional soft tissue calcifications. Osseous alignment is normal. No fracture line or displaced fracture fragment. No acute-appearing cortical irregularity or osseous lesion. Fixation hardware appears appropriately positioned within  the calcaneus. IMPRESSION: 1. No acute findings. 2. Extensive vascular calcifications (atherosclerosis) throughout this visualized portion of the LEFT lower extremity. 3. No additional soft tissue calcifications. Electronically Signed   By: Franki Cabot M.D.   On: 08/12/2020 08:04   DG Ankle Right Port  Result Date: 08/11/2020 CLINICAL DATA:  Renal disease with possible soft tissue calcifications. EXAM: PORTABLE RIGHT ANKLE - 2 VIEW COMPARISON:  None. FINDINGS: Bony structures show no acute fracture or dislocation. Diffuse vascular calcifications are noted. No significant soft tissue calcifications are noted that are not vascular in nature. 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 IMPRESSION: Diffuse vascular calcifications. No other acute abnormality is noted. Electronically Signed   By: Inez Catalina M.D.   On: 08/11/2020 19:13   DG Fluoro Guide CV Line-No Report  Result Date: 08/12/2020 Fluoroscopy was utilized by the requesting physician.  No radiographic interpretation.   Medications: . sodium chloride 200 mL/hr at 08/10/20 1604  . sodium chloride    . sodium chloride    . [START ON 08/14/2020] sodium thiosulfate infusion for calciphylaxis     . sodium chloride   Intravenous Once  . amLODipine  10 mg Oral Daily  . calcium acetate  1,334 mg Oral Q supper  . calcium acetate  667 mg Oral BID WC  . Chlorhexidine Gluconate Cloth  6 each Topical Daily  . cloNIDine  0.2 mg Oral BID  . [START ON 08/14/2020] darbepoetin (ARANESP) injection - DIALYSIS  150 mcg Intravenous Q Tue-HD  . doxercalciferol  3 mcg Intravenous Q T,Th,Sa-HD  . gabapentin  100 mg Oral QHS  . insulin aspart  0-9 Units Subcutaneous Q4H  . pantoprazole  40 mg Oral BID  . sodium zirconium cyclosilicate  10 g Oral Once    Dialysis Orders: TTS - [facility] 4hrs, [id number], [id number], EDW 116.5kg,3K/2.5Ca  Assessment/Plan: 1. Calciphylaxis- Patient with dark lesions  bilateral lower extremities, itching and appearance consistent with hyperpigmented xerosis. Body-wide itching has been an ongoing issue since starting dialysis.  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 Due to severity of pain and extensive calcifications seen throughout LLE on CXR, decision was made to treat as calciphylaxis and start sodium thiosulfate with HD.   Last PTH: 388 (07/12/20 outpatient hemodialysis). Will d/c hectorol for now. Also on calcium acetate as binder, last phos 9.9. Patient does not think he was taking binders regularly outpatient. Will switch binder back to fosrenol.  2. GIB- Patient underwent upper endoscopy4/15 via Dr. Hilarie Fredrickson: Normal esophagus, gastritis, and bleeding duodenal ulcer with a visible vessel. Clips were placed X 3 with apparent hemostasis. Has required 3 units PRBC to date. Due for ESA dose tomorrow. Will continue to monitor Hgb trends. 3. Hematoma L  AVF-Difficulty accessing AVF, new TDC placed by VVS with plans for outpatient fistulogram.  4. ESRD- HD TTS, had HD yesterday off schedule due to access issues as above. Next HD tomorrow. Pt reports feeling weaker post HD with no clear cause, question if he is being oversedated with multiple pain meds? Recommend avoiding morphine in ESRD patients as morphine metabolites can accumulate in renal failure.  5. Hypertension/volume-Blood pressures are improving.Patient not in acute respiratory distress. 2kg over EDW but appears close to euvolemic on exam.  6.  Secondary Hyperparathyroidism -Corr Ca controlled. Phos elevated. Stopping hectorol and changing to non-calcium binder due to concern for calciphylaxis. PTH level pending.  8. Nutrition- Renal diet with fluid restriction  Anice Paganini, PA-C 08/13/2020, 10:13 AM  [facility] Pager: [phone]

## 2020-08-13 NOTE — Plan of Care (Signed)

## 2020-08-14 ENCOUNTER — Telehealth: Payer: Self-pay

## 2020-08-14 LAB — TYPE AND SCREEN
ABO/RH(D): B POS
Antibody Screen: NEGATIVE
Unit division: 0
Unit division: 0
Unit division: 0
Unit division: 0

## 2020-08-14 LAB — GLUCOSE, CAPILLARY
Glucose-Capillary: 104 mg/dL — ABNORMAL HIGH (ref 70–99)
Glucose-Capillary: 119 mg/dL — ABNORMAL HIGH (ref 70–99)
Glucose-Capillary: 143 mg/dL — ABNORMAL HIGH (ref 70–99)
Glucose-Capillary: 143 mg/dL — ABNORMAL HIGH (ref 70–99)
Glucose-Capillary: 156 mg/dL — ABNORMAL HIGH (ref 70–99)

## 2020-08-14 LAB — RENAL FUNCTION PANEL
Albumin: 2.1 g/dL — ABNORMAL LOW (ref 3.5–5.0)
Anion gap: 14 (ref 5–15)
BUN: 86 mg/dL — ABNORMAL HIGH (ref 6–20)
CO2: 23 mmol/L (ref 22–32)
Calcium: 7.9 mg/dL — ABNORMAL LOW (ref 8.9–10.3)
Chloride: 101 mmol/L (ref 98–111)
Creatinine, Ser: 11.26 mg/dL — ABNORMAL HIGH (ref 0.61–1.24)
GFR, Estimated: 5 mL/min — ABNORMAL LOW (ref >60)
Glucose, Bld: 108 mg/dL — ABNORMAL HIGH (ref 70–99)
Phosphorus: 9.2 mg/dL — ABNORMAL HIGH (ref 2.5–4.6)
Potassium: 4.3 mmol/L (ref 3.5–5.1)
Sodium: 138 mmol/L (ref 135–145)

## 2020-08-14 LAB — BPAM RBC
Blood Product Expiration Date: 202204242359
Blood Product Expiration Date: 202204242359
Blood Product Expiration Date: 202205072359
Blood Product Expiration Date: 202205072359
ISSUE DATE / TIME: 202204150933
ISSUE DATE / TIME: 202204151247
ISSUE DATE / TIME: 202204171336
ISSUE DATE / TIME: 202204171336
Unit Type and Rh: 7300
Unit Type and Rh: 7300
Unit Type and Rh: 7300
Unit Type and Rh: 7300

## 2020-08-14 LAB — HEMOGLOBIN AND HEMATOCRIT, BLOOD
HCT: 24.6 % — ABNORMAL LOW (ref 39.0–52.0)
HCT: 24.3 % — ABNORMAL LOW (ref 39.0–52.0)
Hemoglobin: 8.1 g/dL — ABNORMAL LOW (ref 13.0–17.0)
Hemoglobin: 7.9 g/dL — ABNORMAL LOW (ref 13.0–17.0)

## 2020-08-14 LAB — CBC
HCT: 23.8 % — ABNORMAL LOW (ref 39.0–52.0)
Hemoglobin: 7.6 g/dL — ABNORMAL LOW (ref 13.0–17.0)
MCH: 32.2 pg (ref 26.0–34.0)
MCHC: 31.9 g/dL (ref 30.0–36.0)
MCV: 100.8 fL — ABNORMAL HIGH (ref 80.0–100.0)
Platelets: 100 10*3/uL — ABNORMAL LOW (ref 150–400)
RBC: 2.36 MIL/uL — ABNORMAL LOW (ref 4.22–5.81)
RDW: 21.5 % — ABNORMAL HIGH (ref 11.5–15.5)
WBC: 6.1 10*3/uL (ref 4.0–10.5)
nRBC: 0.5 % — ABNORMAL HIGH (ref 0.0–0.2)

## 2020-08-14 LAB — PARATHYROID HORMONE, INTACT (NO CA): PTH: 263 pg/mL — ABNORMAL HIGH (ref 15–65)

## 2020-08-14 LAB — POTASSIUM: Potassium: 3.5 mmol/L (ref 3.5–5.1)

## 2020-08-14 LAB — MAGNESIUM: Magnesium: 2 mg/dL (ref 1.7–2.4)

## 2020-08-14 MED ORDER — DARBEPOETIN ALFA 150 MCG/0.3ML IJ SOSY
PREFILLED_SYRINGE | INTRAMUSCULAR | Status: AC
  Administered 2020-08-14: 150 ug via INTRAVENOUS
  Filled 2020-08-14: qty 0.3

## 2020-08-14 MED ORDER — HEPARIN SODIUM (PORCINE) 1000 UNIT/ML IJ SOLN
INTRAMUSCULAR | Status: AC
  Administered 2020-08-14: 1000 [IU] via INTRAVENOUS_CENTRAL
  Filled 2020-08-14: qty 3

## 2020-08-14 NOTE — Progress Notes (Addendum)
PROGRESS NOTE    Daniel [name]  BWG:[id number] DOB: October 14, 1960 DOA: 08/09/2020 PCP: [name], NP   Chief Complaint  Patient presents with  . 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  EGD showed actively bleeding 9 mm duodenal ulcer and 3 clips were placed.   Nephrology is following patient regarding dialysis.  On 08/03/2019, patient had a AV fistula placed in the left arm by Vascular Dr. Donzetta Matters that was complicated by a small traumatic hematoma formation that was deemed nonfunctional.  On 4/17, Vascular placed tunneled dialysis catheter in the right IJV.  Subjective: Patient is a little sleepy after receiving Dilaudid 0.25 mg for continued left arm pain. I will message Vascular to reassess fistula site tomorrow. Patient's itching has improved. HH is stable. There is no acute bleeding.   Assessment & Plan: Principal Problem:   Acute GI bleeding Active Problems:   Hypertension   DM2 (diabetes mellitus, type 2) (HCC)   Anemia   ESRD (end stage renal disease) ([facility])   Gastrointestinal hemorrhage with melena   Duodenal ulcer hemorrhage  Acute Duodenal Ulcer s/p 4/15 endoscopic clip placement: - S/P 72 hours of of IV Protonix 40 mg BID. Continue Protonix 40 mg PO BID x 30 days.  - Hold blood thinners. - Follow up on H. Pylori results. - Follow up with [facility] outpatient for repeat EGD in 6 weeks, appreciate.  ESRD on dialysis TTS with left brachiocephalic AVF in LUE with small hematoma formation:  On 4/14, a small traumatic hematoma was sustained and fistula was deemed nonfunctional. - On 4/17, Vascular placed dialysis catheter today in the right IJV for access, appreciate. - Vascular will schedule fistulagram outpatient. - Continue dialysis TTS.  Nephrology is following, appreciate assistance and  recommendations.  Anemia of CKD: - 4/17 Hb 6.6 s/p 1 unit pRBC with dialysis. - HH is now stable. - There is no acute or active bleeding. - Continue ESA with dialysis.  Chronic Hypertension: - BP is stable.  Diabetes Mellitus Type 2: - Glucose is stable. - Continue Lispro SSi with POC glucose q6hrs.  History of Alcohol Use: - Counseled on abstinence.  Nodular Rash in lower extremities - dermatosis vs calciphylaxis given patient's history:  - Given symmetric distribution, pruritis, hyperpigmented nodules with xerosis, and nail findings - this looks like nonspecific cutaneous manifestations commonly seen in ESRD.  This does not look like bullous disease or PCT or NSF.   - Hold off on skin biopsy as these often show false negatives. - Continue Gabapentin 100 mg daily for pain complaints and pruritis.  Continue sarna to moisturize dry skin. - Continue Atarax 25 mg PO TID PRN for pruritis and as treatment of dermatosis.  Consider Doxycycline if does not improve.   - Given the patient's significant pain complaints, history of calciphylaxis and Xray findings, continue sodium thiosulfate 25 mg in the last hour of dialysis on TTS.  History of penile calciphylaxis: - Seen by Urology Dr. Milford Cage and Dermatology in past.  See 04/2020 notes.  Physical Deconditioning: - PT will see patient tomorrow.  We will organize discharge plan based on their assessment, appreciate.  Diet Order            Diet NPO time specified  Diet effective midnight  Diet renal with fluid restriction Fluid restriction: 1200 mL Fluid; Room service appropriate? Yes; Fluid consistency: Thin  Diet effective now                 Patient's Body mass index is 36.42 kg/m.  DVT prophylaxis: SCDs Start: 08/10/20 0404 Code Status:   Code Status: Full Code  Family Communication: plan of care discussed with patient at bedside.  Status is: Inpatient  Remains inpatient appropriate because:Inpatient level of care  appropriate due to severity of illness   Dispo: The patient is from: Home              Anticipated d/c is to: Home              Patient currently is not medically stable to d/c.  Discharge once renal function stabilizes, per Nephrology.   Difficult to place patient No   Unresulted Labs (From admission, onward)          Start     Ordered   08/12/20 0757  Hemoglobin and hematocrit, blood  Now then every 8 hours,   R (with TIMED occurrences)     Comments: Does not need the now order just start 12 hours from last hemoglobin draw, call MD for hemoglobin less than 7    08/12/20 0757   08/12/20 0500  CBC  Daily,   R      08/11/20 0825   08/11/20 1435  H. pylori antigen, stool  Once,   R        08/11/20 1434   Signed and Held  CBC  Once,   R        Signed and Held           Medications reviewed:  Scheduled Meds: . sodium chloride   Intravenous Once  . amLODipine  10 mg Oral Daily  . Chlorhexidine Gluconate Cloth  6 each Topical Daily  . Chlorhexidine Gluconate Cloth  6 each Topical Q0600  . cloNIDine  0.2 mg Oral BID  . darbepoetin (ARANESP) injection - DIALYSIS  150 mcg Intravenous Q Tue-HD  . gabapentin  100 mg Oral QHS  . insulin aspart  0-9 Units Subcutaneous Q4H  . lanthanum  1,000 mg Oral TID WC  . pantoprazole  40 mg Oral BID   Continuous Infusions: . sodium chloride 200 mL/hr at 08/10/20 1604  . sodium chloride    . sodium chloride    . sodium thiosulfate infusion for calciphylaxis Stopped (08/14/20 1356)    Consultants:see note  Procedures:see note  Antimicrobials: Anti-infectives (From admission, onward)   Start     Dose/Rate Route Frequency Ordered Stop   08/10/20 1000  ceFAZolin (ANCEF) IVPB 2g/100 mL premix  Status:  Discontinued        2 g 200 mL/hr over 30 Minutes Intravenous To Short Stay 08/10/20 0716 08/10/20 1114     Culture/Microbiology    Component Value Date/Time   SDES BLOOD SITE NOT SPECIFIED 07/07/2020 1025   SPECREQUEST  07/07/2020 1025     BOTTLES DRAWN AEROBIC AND ANAEROBIC Blood Culture adequate volume   CULT  07/07/2020 1025    NO GROWTH 5 DAYS Performed at [facility], [place] [address] [place], [place] [address]    REPTSTATUS 07/12/2020 FINAL 07/07/2020 1025    Other culture-see note  Objective: Vitals: Today's Vitals   08/14/20 1030 08/14/20 1100 08/14/20 1130 08/14/20 1215  BP: 100/62 (!) 146/69 (!) 121/53 (!) 136/50  Pulse:    81  Resp: 14   19  Temp:   98.4 F (36.9 C) 98.4 F (36.9 C)  TempSrc:   Oral   SpO2:   100% 100%  Weight:   118.4 kg   Height:      PainSc:   0-No pain 0-No pain    Intake/Output Summary (Last 24 hours) at 08/14/2020 1654 Last data filed at 08/14/2020 1300 Gross per 24 hour  Intake 1200 ml  Output 3219 ml  Net -2019 ml   Filed Weights   08/13/20 2022 08/14/20 0720 08/14/20 1130  Weight: 121.1 kg 120.4 kg 118.4 kg   Weight change: -0.9 kg  Intake/Output from previous day: 04/18 0701 - 04/19 0700 In: 1080 [P.O.:1080] Out: 1150 [Urine:1150] Intake/Output this shift: Total I/O In: 360 [P.O.:360] Out: 2069 [Other:2069] Filed Weights   08/13/20 2022 08/14/20 0720 08/14/20 1130  Weight: 121.1 kg 120.4 kg 118.4 kg    Examination:  General exam: AAO ,NAD, weak appearing. HEENT:NCAT, PERRL Respiratory system: bilaterally diminished,no use of accessory muscle, non tender. Cardiovascular system: S1 & S2 +, regular Gastrointestinal system: Abdomen soft, NT,ND, BS+. Nervous System:Alert, awake, moving extremities and grossly nonfocal Extremities: mild peripheral edema, distal peripheral pulses palpable.  Skin: hyperpigmented nodular lesions on lower extremity, pruritis MSK: Normal muscle bulk,tone, power  Data Reviewed: I have personally reviewed following labs and imaging studies CBC: Recent Labs  Lab 08/09/20 1440 08/10/20 0117 08/10/20 1833 08/11/20 0908 08/12/20 0021 08/12/20 2152 08/13/20 0808 08/13/20 1618 08/14/20 0417  WBC 5.8   < > 7.1 8.0 6.0   --  5.3  --  6.1  NEUTROABS 3.4  --   --   --   --   --   --   --   --   HGB 7.1*   < > 7.3* 7.4* 6.6* 8.3* 8.1*  8.1* 7.9* 7.6*  HCT 22.8*   < > 22.4* 22.5* 20.2* 25.4* 24.5*  25.3* 24.3* 23.8*  MCV 111.8*   < > 100.4* 99.1 100.0  --  99.6  --  100.8*  PLT 169   < > 119* 120* 105*  --  104*  --  100*   < > = values in this interval not displayed.   Basic Metabolic Panel: Recent Labs  Lab 08/09/20 1537 08/10/20 0117 08/10/20 0845 08/11/20 0240 08/12/20 0021 08/13/20 0808 08/14/20 1001  NA 138   < > 136 139 140 137 138  K 4.7   < > 4.4 5.2* 4.8 3.9 4.3  CL 100   < > 102 102 102 101 101  CO2 21*  --   --  18* 23 25 23   GLUCOSE 104*   < > 94 83 104* 135* 108*  BUN 79*   < > 114* 134* 123* 68* 86*  CREATININE 10.51*   < > 13.10* 12.07* 11.59* 9.15* 11.26*  CALCIUM 8.2*  --   --  8.3* 8.2* 8.2* 7.9*  PHOS  --   --   --   --  9.9*  --  9.2*   < > = values in this interval not displayed.   GFR: Estimated Creatinine Clearance: 9.1 mL/min (A) (by C-G formula based on SCr of 11.26 mg/dL (H)). Liver Function Tests: Recent Labs  Lab 08/12/20 0021 08/14/20 1001  AST 39  --   ALT 16  --   ALKPHOS 38  --   BILITOT 0.7  --   PROT 5.9*  --   ALBUMIN 2.1* 2.1*   No results for  input(s): LIPASE, AMYLASE in the last 168 hours. No results for input(s): AMMONIA in the last 168 hours. Coagulation Profile: Recent Labs  Lab 08/09/20 1524  INR 1.1   CBG: Recent Labs  Lab 08/13/20 1720 08/13/20 2024 08/14/20 0020 08/14/20 0433 08/14/20 1216  GLUCAP 131* 114* 143* 104* 119*     Recent Results (from the past 240 hour(s))  Resp Panel by RT-PCR (Flu A&B, Covid) Nasopharyngeal Swab     Status: None   Collection Time: 08/10/20  2:58 AM   Specimen: Nasopharyngeal Swab; Nasopharyngeal(NP) swabs in vial transport medium  Result Value Ref Range Status   SARS Coronavirus 2 by RT PCR NEGATIVE NEGATIVE Final    Comment: (NOTE) SARS-CoV-2 target nucleic acids are NOT DETECTED.  The  SARS-CoV-2 RNA is generally detectable in upper respiratory specimens during the acute phase of infection. The lowest concentration of SARS-CoV-2 viral copies this assay can detect is 138 copies/mL. A negative result does not preclude SARS-Cov-2 infection and should not be used as the sole basis for treatment or other patient management decisions. A negative result may occur with  improper specimen collection/handling, submission of specimen other than nasopharyngeal swab, presence of viral mutation(s) within the areas targeted by this assay, and inadequate number of viral copies(<138 copies/mL). A negative result must be combined with clinical observations, patient history, and epidemiological information. The expected result is Negative.  Fact Sheet for Patients:  EntrepreneurPulse.com.au  Fact Sheet for Healthcare Providers:  IncredibleEmployment.be  This test is no t yet approved or cleared by the [place] FDA and  has been authorized for detection and/or diagnosis of SARS-CoV-2 by FDA under an Emergency Use Authorization (EUA). This EUA will remain  in effect (meaning this test can be used) for the duration of the COVID-19 declaration under Section 564(b)(1) of the Act, 21 U.S.C.section 360bbb-3(b)(1), unless the authorization is terminated  or revoked sooner.       Influenza A by PCR NEGATIVE NEGATIVE Final   Influenza B by PCR NEGATIVE NEGATIVE Final    Comment: (NOTE) The Xpert Xpress SARS-CoV-2/FLU/RSV plus assay is intended as an aid in the diagnosis of influenza from Nasopharyngeal swab specimens and should not be used as a sole basis for treatment. Nasal washings and aspirates are unacceptable for Xpert Xpress SARS-CoV-2/FLU/RSV testing.  Fact Sheet for Patients: EntrepreneurPulse.com.au  Fact Sheet for Healthcare Providers: IncredibleEmployment.be  This test is not yet approved or  cleared by the [place] FDA and has been authorized for detection and/or diagnosis of SARS-CoV-2 by FDA under an Emergency Use Authorization (EUA). This EUA will remain in effect (meaning this test can be used) for the duration of the COVID-19 declaration under Section 564(b)(1) of the Act, 21 U.S.C. section 360bbb-3(b)(1), unless the authorization is terminated or revoked.  Performed at [facility], [place] [address] [place], [place] [address]      Radiology Studies: No results found.   LOS: 3 days   George Hugh, MD [facility]  08/14/2020, 4:54 PM

## 2020-08-14 NOTE — Progress Notes (Signed)
[name] Progress Note   Subjective:   Patient seen on HD, tolerating procedure well. Reports legs still feel heavy. Denies SOB, CP, palpitations, dizziness, abdominal pain and nausea.   Objective Vitals:   08/14/20 0720 08/14/20 0729 08/14/20 0745 08/14/20 0804  BP: (!) 116/50 (!) 121/51 (!) 129/29 (!) 143/52  Pulse: 70     Resp: 18  15 19   Temp: 98 F (36.7 C)     TempSrc: Oral     SpO2: 96%     Weight: 120.4 kg     Height:       Physical Exam General: WDWN male, alert and in NAD Heart: RRR, no murmurs, rubs or gallops Lungs: CTA bilaterally without wheezing, rhonchi or rales Abdomen: Soft, non-tender, non-distended, +BS Extremities: scattered hyperpigmented nodules b/l lower extremities, scaly/peeling skin noted bottom of R foot Dialysis Access: R IJ TDC accessed, LUE AVF with faint bruit  Additional Objective Labs: Basic Metabolic Panel: Recent Labs  Lab 08/11/20 0240 08/12/20 0021 08/13/20 0808  NA 139 140 137  K 5.2* 4.8 3.9  CL 102 102 101  CO2 18* 23 25  GLUCOSE 83 104* 135*  BUN 134* 123* 68*  CREATININE 12.07* 11.59* 9.15*  CALCIUM 8.3* 8.2* 8.2*  PHOS  --  9.9*  --    Liver Function Tests: Recent Labs  Lab 08/12/20 0021  AST 39  ALT 16  ALKPHOS 38  BILITOT 0.7  PROT 5.9*  ALBUMIN 2.1*   CBC: Recent Labs  Lab 08/09/20 1440 08/10/20 0117 08/10/20 1833 08/11/20 0908 08/12/20 0021 08/12/20 2152 08/13/20 0808 08/13/20 1618 08/14/20 0417  WBC 5.8   < > 7.1 8.0 6.0  --  5.3  --  6.1  NEUTROABS 3.4  --   --   --   --   --   --   --   --   HGB 7.1*   < > 7.3* 7.4* 6.6*   < > 8.1*  8.1* 7.9* 7.6*  HCT 22.8*   < > 22.4* 22.5* 20.2*   < > 24.5*  25.3* 24.3* 23.8*  MCV 111.8*   < > 100.4* 99.1 100.0  --  99.6  --  100.8*  PLT 169   < > 119* 120* 105*  --  104*  --  100*   < > = values in this interval not displayed.   Blood Culture    Component Value Date/Time   SDES BLOOD SITE NOT SPECIFIED 07/07/2020 1025    SPECREQUEST  07/07/2020 1025    BOTTLES DRAWN AEROBIC AND ANAEROBIC Blood Culture adequate volume   CULT  07/07/2020 1025    NO GROWTH 5 DAYS Performed at [facility], [place] [address] [place], [place] [address]    REPTSTATUS 07/12/2020 FINAL 07/07/2020 1025   CBG: Recent Labs  Lab 08/13/20 1125 08/13/20 1720 08/13/20 2024 08/14/20 0020 08/14/20 0433  GLUCAP 133* 131* 114* 143* 104*    Studies/Results: DG CHEST PORT 1 VIEW  Result Date: 08/12/2020 CLINICAL DATA:  Status post hemodialysis catheter insertion. EXAM: PORTABLE CHEST 1 VIEW COMPARISON:  08/04/2020 FINDINGS: Patient has RIGHT-sided internal jugular dialysis catheter, tip overlying the level of the UPPER RIGHT atrium. There is no pneumothorax following line placement. The heart is enlarged and stable in configuration.  Lungs are clear. IMPRESSION: Interval placement of RIGHT internal jugular dialysis catheter. Stable cardiomegaly. Electronically Signed   By: Nolon Nations M.D.   On: 08/12/2020 09:05   Medications: . sodium chloride 200  mL/hr at 08/10/20 1604  . sodium chloride    . sodium chloride    . sodium thiosulfate infusion for calciphylaxis     . sodium chloride   Intravenous Once  . amLODipine  10 mg Oral Daily  . Chlorhexidine Gluconate Cloth  6 each Topical Daily  . Chlorhexidine Gluconate Cloth  6 each Topical Q0600  . cloNIDine  0.2 mg Oral BID  . darbepoetin (ARANESP) injection - DIALYSIS  150 mcg Intravenous Q Tue-HD  . gabapentin  100 mg Oral QHS  . insulin aspart  0-9 Units Subcutaneous Q4H  . lanthanum  1,000 mg Oral TID WC  . 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 Body-wide itching has been an ongoing issue since starting dialysis.  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 Due to severity of pain and extensive calcifications seen throughout LLE on CXR, decision was made to treat as calciphylaxis and start sodium thiosulfate with HD.   Last PTH: 388 (07/12/20 outpatient hemodialysis). Will d/c hectorol for now. Binder changed to fosrenol. 2.GIB- Patient underwent upper endoscopy4/15 via Dr. Hilarie Fredrickson: Normal esophagus, gastritis, and bleeding duodenal ulcer with a visible vessel. Clips were placed X 3 with apparent hemostasis. Has required 3 units PRBC to date. Due for ESA dose today.Will continue to monitor Hgb trends. 3.Hematoma L AVF-Difficulty accessing AVF, new TDC placed by [facility] with plans for outpatient fistulogram.  4.ESRD- HD TTS. Pt reports feeling weaker lately. Morphine changed to dilaudid due to ESRD.  5.Hypertension/volume-Blood pressures are improving. 2kg over EDW but appears close to euvolemic on exam. UF with HD today as tolerated.  6. Secondary Hyperparathyroidism -Corr Ca controlled. Phos elevated. Stopping hectorol and changing to non-calcium binder due to concern for calciphylaxis. PTH level pending.  8.Nutrition-Renal diet with fluid restriction  Anice Paganini, PA-C 08/14/2020, 8:20 AM  [facility] Pager: [phone]

## 2020-08-14 NOTE — Telephone Encounter (Signed)
Called and left message for patient to call back to schedule an appointment with Dr. Havery Moros on 5-26 or 5-27 for F/U duodenal ulcer, GI bleed.

## 2020-08-14 NOTE — Plan of Care (Signed)
  Problem: Education: Goal: Knowledge of General Education information will improve Description Including pain rating scale, medication(s)/side effects and non-pharmacologic comfort measures Outcome: Progressing   Problem: Health Behavior/Discharge Planning: Goal: Ability to manage health-related needs will improve Outcome: Progressing   

## 2020-08-14 NOTE — Progress Notes (Signed)
Patient run 5 bts of VTACH non-sustaining. VSS. Complaints of pain of his bilateral arms and legs. Denies chest pain and SOB. MD night provider MD Rathore made aware.

## 2020-08-14 NOTE — Plan of Care (Signed)
  Problem: Pain Managment: Goal: General experience of comfort will improve Outcome: Progressing   Problem: Fluid Volume: Goal: Compliance with measures to maintain balanced fluid volume will improve Outcome: Progressing   Problem: Nutritional: Goal: Ability to make healthy dietary choices will improve Outcome: Progressing

## 2020-08-14 NOTE — Telephone Encounter (Signed)
-----   Message from Yetta Flock, MD sent at 08/12/2020  4:03 PM EDT ----- Regarding: RE: Office follow-up, and EGD Thanks Amy, Jan can you help coordinate a follow up office visit with me or Amy in the next 4-6 weeks? Also if you can help get him on my schedule for an EGD in 2 months that would be good, as we are booking so far out for procedures right now. Thanks!  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 Havery Moros does not have any appointments, thanks

## 2020-08-15 ENCOUNTER — Inpatient Hospital Stay (HOSPITAL_COMMUNITY)
Admission: EM | Disposition: A | Discharge status: Home Health | Payer: Self-pay | Source: Home / Self Care | Attending: Internal Medicine

## 2020-08-15 ENCOUNTER — Ambulatory Visit (HOSPITAL_COMMUNITY): Admission: EM | Admit: 2020-08-15 | Payer: Medicare Other | Source: Home / Self Care | Admitting: Vascular Surgery

## 2020-08-15 ENCOUNTER — Encounter (HOSPITAL_COMMUNITY): Payer: Self-pay | Admitting: Vascular Surgery

## 2020-08-15 DIAGNOSIS — N186 End stage renal disease: Secondary | ICD-10-CM

## 2020-08-15 DIAGNOSIS — T82858A Stenosis of vascular prosthetic devices, implants and grafts, initial encounter: Secondary | ICD-10-CM

## 2020-08-15 DIAGNOSIS — Z992 Dependence on renal dialysis: Secondary | ICD-10-CM

## 2020-08-15 HISTORY — PX: A/V FISTULAGRAM: CATH118298

## 2020-08-15 LAB — BASIC METABOLIC PANEL
Anion gap: 19 — ABNORMAL HIGH (ref 5–15)
BUN: 37 mg/dL — ABNORMAL HIGH (ref 6–20)
CO2: 23 mmol/L (ref 22–32)
Calcium: 8.1 mg/dL — ABNORMAL LOW (ref 8.9–10.3)
Chloride: 98 mmol/L (ref 98–111)
Creatinine, Ser: 6.91 mg/dL — ABNORMAL HIGH (ref 0.61–1.24)
GFR, Estimated: 8 mL/min — ABNORMAL LOW (ref >60)
Glucose, Bld: 125 mg/dL — ABNORMAL HIGH (ref 70–99)
Potassium: 3.8 mmol/L (ref 3.5–5.1)
Sodium: 140 mmol/L (ref 135–145)

## 2020-08-15 LAB — CBC
HCT: 25 % — ABNORMAL LOW (ref 39.0–52.0)
Hemoglobin: 8.1 g/dL — ABNORMAL LOW (ref 13.0–17.0)
MCH: 31.9 pg (ref 26.0–34.0)
MCHC: 32.4 g/dL (ref 30.0–36.0)
MCV: 98.4 fL (ref 80.0–100.0)
Platelets: 89 10*3/uL — ABNORMAL LOW (ref 150–400)
RBC: 2.54 MIL/uL — ABNORMAL LOW (ref 4.22–5.81)
RDW: 20.6 % — ABNORMAL HIGH (ref 11.5–15.5)
WBC: 6.7 10*3/uL (ref 4.0–10.5)
nRBC: 0 % (ref 0.0–0.2)

## 2020-08-15 LAB — GLUCOSE, CAPILLARY
Glucose-Capillary: 135 mg/dL — ABNORMAL HIGH (ref 70–99)
Glucose-Capillary: 114 mg/dL — ABNORMAL HIGH (ref 70–99)
Glucose-Capillary: 128 mg/dL — ABNORMAL HIGH (ref 70–99)
Glucose-Capillary: 173 mg/dL — ABNORMAL HIGH (ref 70–99)
Glucose-Capillary: 122 mg/dL — ABNORMAL HIGH (ref 70–99)

## 2020-08-15 SURGERY — A/V FISTULAGRAM
Anesthesia: LOCAL

## 2020-08-15 MED ORDER — FENTANYL CITRATE (PF) 100 MCG/2ML IJ SOLN
INTRAMUSCULAR | Status: DC | PRN
  Administered 2020-08-15: 25 ug via INTRAVENOUS

## 2020-08-15 MED ORDER — FENTANYL CITRATE (PF) 100 MCG/2ML IJ SOLN
INTRAMUSCULAR | Status: AC
  Filled 2020-08-15: qty 2

## 2020-08-15 MED ORDER — LIDOCAINE HCL (PF) 1 % IJ SOLN
INTRAMUSCULAR | Status: DC | PRN
  Administered 2020-08-15: 2 mL via INTRADERMAL

## 2020-08-15 MED ORDER — LIDOCAINE HCL (PF) 1 % IJ SOLN
INTRAMUSCULAR | Status: AC
  Filled 2020-08-15: qty 30

## 2020-08-15 MED ORDER — SENNOSIDES-DOCUSATE SODIUM 8.6-50 MG PO TABS
2.0000 | ORAL_TABLET | Freq: Every day | ORAL | Status: DC
  Administered 2020-08-15: 2 via ORAL
  Filled 2020-08-15: qty 2

## 2020-08-15 MED ORDER — IODIXANOL 320 MG/ML IV SOLN
INTRAVENOUS | Status: DC | PRN
  Administered 2020-08-15: 55 mL

## 2020-08-15 MED ORDER — PREDNISONE 20 MG PO TABS
40.0000 mg | ORAL_TABLET | Freq: Every day | ORAL | Status: DC
  Administered 2020-08-16: 40 mg via ORAL
  Filled 2020-08-15: qty 2

## 2020-08-15 MED ORDER — MIDAZOLAM HCL 2 MG/2ML IJ SOLN
INTRAMUSCULAR | Status: AC
  Filled 2020-08-15: qty 2

## 2020-08-15 MED ORDER — HEPARIN (PORCINE) IN NACL 1000-0.9 UT/500ML-% IV SOLN
INTRAVENOUS | Status: DC | PRN
  Administered 2020-08-15: 500 mL

## 2020-08-15 MED ORDER — MIDAZOLAM HCL 2 MG/2ML IJ SOLN
INTRAMUSCULAR | Status: DC | PRN
  Administered 2020-08-15: 1 mg via INTRAVENOUS

## 2020-08-15 MED ORDER — HEPARIN (PORCINE) IN NACL 1000-0.9 UT/500ML-% IV SOLN
INTRAVENOUS | Status: AC
  Filled 2020-08-15: qty 500

## 2020-08-15 MED ORDER — HYDROXYZINE HCL 25 MG PO TABS
25.0000 mg | ORAL_TABLET | Freq: Two times a day (BID) | ORAL | Status: DC
  Administered 2020-08-15 – 2020-08-16 (×2): 25 mg via ORAL
  Filled 2020-08-15 (×2): qty 1

## 2020-08-15 MED ORDER — CHLORHEXIDINE GLUCONATE CLOTH 2 % EX PADS
6.0000 | MEDICATED_PAD | Freq: Every day | CUTANEOUS | Status: DC
  Administered 2020-08-15: 6 via TOPICAL

## 2020-08-15 SURGICAL SUPPLY — 24 items
BAG SNAP BAND KOVER 36X36 (MISCELLANEOUS) ×2 IMPLANT
BALLN ATHLETIS 8X60X75 (BALLOONS) ×2
BALLN MUSTANG 8X80X75 (BALLOONS) ×2
BALLN MUSTANG 9X40X75 (BALLOONS) ×2
BALLOON ATHLETIS 8X60X75 (BALLOONS) ×1 IMPLANT
BALLOON MUSTANG 8X80X75 (BALLOONS) ×1 IMPLANT
BALLOON MUSTANG 9X40X75 (BALLOONS) ×1 IMPLANT
CATH STRAIGHT 5FR 65CM (CATHETERS) ×2 IMPLANT
COVER DOME SNAP 22 D (MISCELLANEOUS) ×2 IMPLANT
GLIDEWIRE ADV .035X180CM (WIRE) ×2 IMPLANT
KIT ENCORE 26 ADVANTAGE (KITS) ×2 IMPLANT
KIT MICROPUNCTURE NIT STIFF (SHEATH) ×2 IMPLANT
PROTECTION STATION PRESSURIZED (MISCELLANEOUS) ×2
SHEATH PINNACLE R/O II 6F 4CM (SHEATH) ×2 IMPLANT
SHEATH PINNACLE R/O II 7F 4CM (SHEATH) ×2 IMPLANT
SHEATH PROBE COVER 6X72 (BAG) ×2 IMPLANT
STATION PROTECTION PRESSURIZED (MISCELLANEOUS) ×1 IMPLANT
STENT VIABAHN 8X50X120 (Permanent Stent) ×2 IMPLANT
STENT VIABAHN5X120X8X (Permanent Stent) ×1 IMPLANT
STOPCOCK MORSE 400PSI 3WAY (MISCELLANEOUS) ×2 IMPLANT
TRAY PV CATH (CUSTOM PROCEDURE TRAY) ×2 IMPLANT
TUBING CIL FLEX 10 FLL-RA (TUBING) ×2 IMPLANT
WIRE BENTSON .035X145CM (WIRE) ×2 IMPLANT
WIRE G V18X300CM (WIRE) ×2 IMPLANT

## 2020-08-15 NOTE — Care Management Important Message (Signed)
Important Message  Patient Details  Name: Daniel [name] MRN: [id number] Date of Birth: 05/25/1960   Medicare Important Message Given:  Yes     Allanna Bresee Montine Circle 08/15/2020, 3:03 PM

## 2020-08-15 NOTE — Telephone Encounter (Signed)
Called and spoke to patient. He is currently admitted.

## 2020-08-15 NOTE — Evaluation (Signed)
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  EGD showed actively bleeding 9 mm duodenal ulcer and 3 clips were placed. Noted some issues with fistula for HD access.  Clinical Impression   Pt admitted with above diagnosis. Lives at home in a single level apartment with about a flight of steps to enter; Completely independent prior to admission, including driving -- reports having to wait after HD until he feels well enough to drive home;  Presents to PT with decr activity tolerance, significant R wrist pain limiting his ability to complete ADLs without pain; Discussed with Dr. Lavera Guise and Care Team via [place]; Pt currently with functional limitations due to the deficits listed below (see PT Problem List). Pt will benefit from skilled PT to increase their independence and safety with mobility to allow discharge to the venue listed below.       Follow Up Recommendations No PT follow up    Equipment Recommendations  Other (comment) (Platform attachment for his own RW)    Recommendations for Other Services       Precautions / Restrictions Precautions Precautions: Fall Precaution Comments: Fall risk greatly reduced with use of RW      Mobility  Bed Mobility Overal bed mobility: Needs Assistance Bed Mobility: Supine to Sit     Supine to sit: Supervision     General bed mobility comments: Incr time and used rails; Grimace using R hand; Lightheadedness initially sitting up; decr with taking time in sitting before getting up; VSS    Transfers Overall transfer level: Needs assistance Equipment used: Rolling walker (2 wheeled) Transfers: Sit to/from Stand Sit to Stand: Min guard (with physical contact)          General transfer comment: Slow rise, but no need for assist  Ambulation/Gait Ambulation/Gait assistance: Min guard (with and without physical contact) Gait Distance (Feet): 20 Feet Assistive device: Rolling walker (2 wheeled) Gait Pattern/deviations: Step-through pattern Gait velocity: slow   General Gait Details: Cues to self-monitor for activity tolerance  Stairs            Wheelchair Mobility    Modified Rankin (Stroke Patients Only)       Balance                                             Pertinent Vitals/Pain Pain Assessment: 0-10 Pain Score: 9  Pain Location: R Wrist; also swollen Pain Descriptors / Indicators: Sharp (with weight bearing, light touch, brushing teeth) Pain Intervention(s): Monitored during session (Elevated on pillow)    Home Living Family/patient expects to be discharged to:: Private residence Living Arrangements: Other relatives Available Help at Discharge: Family;Other (Comment) (though cousin is not very helpful) Type of Home: Apartment Home Access: Stairs to enter Entrance Stairs-Rails: Right Entrance Stairs-Number of Steps: flight Home Layout: One level Home Equipment: Walker - 2 wheels      Prior Function Level of Independence: Independent         Comments: Driving, independent IADLs; tells me he often has to wait after HD until he feels well enough to drive  Hand Dominance   Dominant Hand: Right    Extremity/Trunk Assessment   Upper Extremity Assessment Upper Extremity Assessment: RUE deficits/detail RUE Deficits / Details: Very painful R wrist, swollen and slightly warm to touch compared to L wrist; Difficulty and painful with brushing teeth RUE: Unable to fully assess due to pain    Lower Extremity Assessment Lower Extremity Assessment: Generalized weakness       Communication   Communication: No difficulties  Cognition Arousal/Alertness: Awake/alert Behavior During Therapy: WFL for  tasks assessed/performed Overall Cognitive Status: Within Functional Limits for tasks assessed                                        General Comments General comments (skin integrity, edema, etc.): Itchy skin, REcommend pt use washcloth to rub skin instead of scratching; place lotions on the table near him    Exercises     Assessment/Plan    PT Assessment Patient needs continued PT services  PT Problem List Decreased strength;Decreased range of motion;Decreased activity tolerance;Decreased balance;Decreased mobility;Decreased knowledge of use of DME;Decreased knowledge of precautions;Obesity;Pain       PT Treatment Interventions DME instruction;Gait training;Stair training;Functional mobility training;Therapeutic activities;Therapeutic exercise;Patient/family education    PT Goals (Current goals can be found in the Care Plan section)  Acute Rehab PT Goals Patient Stated Goal: Hopes to be home soon PT Goal Formulation: With patient Time For Goal Achievement: 08/29/20 Potential to Achieve Goals: Good    Frequency Min 3X/week   Barriers to discharge        Co-evaluation               AM-PAC PT "6 Clicks" Mobility  Outcome Measure Help needed turning from your back to your side while in a flat bed without using bedrails?: None Help needed moving from lying on your back to sitting on the side of a flat bed without using bedrails?: None Help needed moving to and from a bed to a chair (including a wheelchair)?: None Help needed standing up from a chair using your arms (e.g., wheelchair or bedside chair)?: A Little Help needed to walk in hospital room?: A Little Help needed climbing 3-5 steps with a railing? : A Lot 6 Click Score: 20    End of Session Equipment Utilized During Treatment: Gait belt Activity Tolerance: Patient tolerated treatment well Patient left: in chair;with call bell/phone within reach;with chair alarm set Nurse Communication:  Mobility status;Other (comment) (Pianful R wrist) PT Visit Diagnosis: Other abnormalities of gait and mobility (R26.89);Pain Pain - Right/Left: Right Pain - part of body:  (wrist)    Time: 0930-1004 PT Time Calculation (min) (ACUTE ONLY): 34 min   Charges:   PT Evaluation $PT Eval Moderate Complexity: 1 Mod PT Treatments $Gait Training: 8-22 mins        Roney Marion, PT  Acute Rehabilitation Services Pager [phone] Office [phone]   Colletta [place] 08/15/2020, 10:41 AM

## 2020-08-15 NOTE — Op Note (Signed)
DATE OF SERVICE: 08/15/2020  PATIENT:  Daniel [name]  60 y.o. male  PRE-OPERATIVE DIAGNOSIS:  Pulsatile left upper extremity arteriovenous fistula, recent traumatic cannulation of AVF.  POST-OPERATIVE DIAGNOSIS:  Same  PROCEDURE:   1) left upper extremity fistulagram (8mL contrast) 2) left cephalic arch stenting and angioplasty (8x12mm Viabahn) 3) left AVF angioplasty (9x2mm Athletis) 4) Conscious sedation (17 minutes)  SURGEON:  Surgeon(s) and Role:    * Cherre Robins, MD - Primary  ASSISTANT: none  ANESTHESIA:   local and IV sedation  EBL: min  BLOOD ADMINISTERED:none  DRAINS: none   LOCAL MEDICATIONS USED:  LIDOCAINE   SPECIMEN:  none  COUNTS: confirmed correct.  TOURNIQUET:  None  PATIENT DISPOSITION:  PACU - hemodynamically stable.   Delay start of Pharmacological VTE agent (>24hrs) due to surgical blood loss or risk of bleeding: no  INDICATION FOR PROCEDURE: Daniel [name] is a 60 y.o. male with pulsatile left upper extremity arteriovenous fistula and recent traumatic cannulation. After careful discussion of risks, benefits, and alternatives the patient was offered fistulagram with possible intervention. The patient understood and wished to proceed.  OPERATIVE FINDINGS: severe left cephalic arch stenosis, 28% resolution with angioplasty, resolved with stenting. ~75% AVF stenosis, 20% residual after angioplasty. Much improved thrill in AVF on completion.  DESCRIPTION OF PROCEDURE: After identification of the patient in the pre-operative holding area, the patient was transferred to the operating room. The patient was positioned supine on the operating room table. Anesthesia was induced. The left arm was prepped and draped in standard fashion. A surgical pause was performed confirming correct patient, procedure, and operative location.  Using micropuncture technique, the left upper extremity AV fistula was accessed and a 4 French sheath placed in the  fistula.  Fistulogram's were performed in stations.  This identified an AV fistula stenosis as well as the cephalic arch stenosis.  There appeared to be no stenosis in the central veins.  Initially we had great difficulty tracking a Bentson wire and at the let us balloon across the cephalic arch stenosis.  The wire was withdrawn and we reattempted to access the central veins.  Using a series of wire and catheter exchanges an 018 V18 wire was advanced into the central veins.  Over the wire angioplasty was performed of the cephalic arch using an 8 x 60 Athletics balloon.  The cephalic arch stenosis did not resolve with angioplasty.  I elected to treat this with a 8 x 50 mm Viabahn stent.  This was postdilated with a 9 x 40 mm Atlas balloon.  Resolution of the cephalic arch stenosis was noted.  The AV fistula stenosis was then treated with the same 9 x 40 mm Atlas balloon.  The 75% stenosis did not complete resolve.  About 20% residual stenosis was noted.  Thrill was much improved in the fistula.  We ended the case here.  The wire, balloon, and sheath were withdrawn.  A figure-of-eight Monocryl suture was placed at the access site.  Hemostasis was achieved.  Conscious sedation was administered with the use of IV fentanyl and midazolam under continuous physician and nurse monitoring.  Heart rate, blood pressure, and oxygen saturation were continuously monitored.  Total sedation time was 18 minutes  Upon completion of the case instrument and sharps counts were confirmed correct. The patient was transferred to the PACU in good condition. I was present for all portions of the procedure.  Yevonne Aline. Stanford Breed, MD [facility]  Number: [phone] 08/15/2020 12:49 PM

## 2020-08-15 NOTE — Plan of Care (Signed)
  Problem: Clinical Measurements: Goal: Will remain free from infection Outcome: Progressing   Problem: Coping: Goal: Level of anxiety will decrease Outcome: Progressing   Problem: Pain Managment: Goal: General experience of comfort will improve Outcome: Progressing   

## 2020-08-15 NOTE — Progress Notes (Signed)
[facility] [place] PROGRESS NOTE  ASSESSMENT / PLAN: Daniel [name] is a 60 y.o. male with pulsatile left upper extremity brachiocephalic arteriovenous fistula. Recent traumatic cannulation. Plan fistulagram today.   SUBJECTIVE: No interval complaints.  OBJECTIVE: BP (!) 135/50 (BP Location: Right Arm)   Pulse 70   Temp 98 F (36.7 C) (Oral)   Resp 18   Ht 5' 10.98" (1.803 m)   Wt 118.4 kg   SpO2 100%   BMI 36.42 kg/m   Intake/Output Summary (Last 24 hours) at 08/15/2020 1248 Last data filed at 08/15/2020 1024 Gross per 24 hour  Intake 600 ml  Output 200 ml  Net 400 ml    Pulsatile LUE AVF  CBC Latest Ref Rng & Units 08/15/2020 08/14/2020 08/14/2020  WBC 4.0 - 10.5 K/uL 6.7 - -  Hemoglobin 13.0 - 17.0 g/dL 8.1(L) 7.9(L) 8.1(L)  Hematocrit 39.0 - 52.0 % 25.0(L) 24.3(L) 24.6(L)  Platelets 150 - 400 K/uL 89(L) - -     CMP Latest Ref Rng & Units 08/15/2020 08/14/2020 08/14/2020  Glucose 70 - 99 mg/dL 125(H) - 108(H)  BUN 6 - 20 mg/dL 37(H) - 86(H)  Creatinine 0.61 - 1.24 mg/dL 6.91(H) - 11.26(H)  Sodium 135 - 145 mmol/L 140 - 138  Potassium 3.5 - 5.1 mmol/L 3.8 3.5 4.3  Chloride 98 - 111 mmol/L 98 - 101  CO2 22 - 32 mmol/L 23 - 23  Calcium 8.9 - 10.3 mg/dL 8.1(L) - 7.9(L)  Total Protein 6.5 - 8.1 g/dL - - -  Total Bilirubin 0.3 - 1.2 mg/dL - - -  Alkaline Phos 38 - 126 U/L - - -  AST 15 - 41 U/L - - -  ALT 0 - 44 U/L - - -    Estimated Creatinine Clearance: 14.9 mL/min (A) (by C-G formula based on SCr of 6.91 mg/dL (H)).  Yevonne Aline. Stanford Breed, MD [facility] Phone Number: [phone] 08/15/2020 12:48 PM

## 2020-08-15 NOTE — Progress Notes (Addendum)
PROGRESS NOTE    Daniel [name]  FKC:[id number] DOB: November 18, 1960 DOA: 08/09/2020 PCP: [name], NP   Chief Complaint  Patient presents with  . 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  EGD showed actively bleeding 9 mm duodenal ulcer and 3 clips were placed.    On 08/03/2019, patient had a AV fistula placed in the left arm by Vascular Dr. Donzetta Matters, which deemed on nonfunctional on 4/16.   On 08/12/2020, Vascular placed tunneled dialysis catheter in the right IJV.   On 08/15/2020, Vascular performed left fistula angioplasty s/p traumatic cannulation.  Subjective: Patient is sitting in chair today.  Doing well. Itching has improved on Atarax. HH has been stable.  There has been no acute bleeding. We will discharge patient home tomorrow with outpatient PT.  Assessment & Plan: Principal Problem:   Acute GI bleeding Active Problems:   Hypertension   DM2 (diabetes mellitus, type 2) (HCC)   Anemia   ESRD (end stage renal disease) ([place])   Gastrointestinal hemorrhage with melena   Duodenal ulcer hemorrhage  Acute Duodenal Ulcer s/p 4/15 endoscopic clip placement: - S/P 72 hours of of IV Protonix 40 mg BID. Continue Protonix 40 mg PO BID x 30 days.  - Hold blood thinners. - Follow up on H. Pylori results. - Follow up with [facility] outpatient for repeat EGD in 6 weeks, appreciate.  ESRD on dialysis TTS with left brachiocephalic AVF in LUE with small hematoma formation:   - Patient had Fistula Angioplasty today, appreciate Vascular. - Continue dialysis TTS.  Nephrology is following, appreciate assistance and recommendations.  Anemia of CKD: - 4/17 Hb 6.6 s/p 1 unit pRBC with dialysis. - There has been no acute  bleeding s/p 4/15 EGD and HH has been stable. - Continue ESA with dialysis.  Chronic Hypertension: - BP  is stable.  Diabetes Mellitus Type 2: - Continue Lispro SSi with POC glucose q6hrs.  History of Alcohol Use: - Counseled on abstinence.  Nodular Rash in lower extremities - dermatosis vs calciphylaxis given patient's history:  - Given symmetric distribution, pruritis, hyperpigmented nodules with xerosis, and nail findings - this looks like nonspecific cutaneous manifestations commonly seen in ESRD.   - Continue Gabapentin 100 mg daily for pain complaints and pruritis.   - Continue sarna to moisturize dry skin. - Continue Atarax 25 mg PO BID for pruritis and as treatment of dermatosis. - Given the patient's significant pain complaints, history of calciphylaxis and Xray findings, continue sodium thiosulfate 25 mg in the last hour of dialysis on TTS.  History of penile calciphylaxis: - Seen by Urology Dr. Milford Cage and Dermatology in past.  See 04/2020 notes.  Acute Gout of Right Wrist: - Start Prednisone 40 mg QD and treat for 5 days.  Physical Deconditioning: - PT recommends discharge home with outpatient PT.  Diet Order            Diet renal/carb modified with fluid restriction Fluid restriction: 1200 mL Fluid; Room service appropriate? Yes; Fluid consistency: Thin  Diet effective now                 Patient's Body mass index is 36.42 kg/m.  DVT prophylaxis: SCDs Start: 08/10/20 0404 Code Status:   Code Status: Full Code  Family Communication: plan of care discussed  with patient at bedside.  Status is: Inpatient  Remains inpatient appropriate because:Inpatient level of care appropriate due to severity of illness   Dispo: The patient is from: Home              Anticipated d/c is to: Home              Patient currently is not medically stable to d/c.  Discharge Home with [facility] and PT tomorrow.   Difficult to place patient No   Unresulted Labs (From admission, onward)          Start     Ordered   08/12/20 0500  CBC  Daily,   R      08/11/20 0825   08/11/20 1435  H.  pylori antigen, stool  Once,   R        08/11/20 1434   Signed and Held  CBC  Once,   R        Signed and Held   Signed and Held  Renal function panel  Once,   R        Signed and Held   Signed and Held  CBC  Once,   R        Signed and Held           Medications reviewed:  Scheduled Meds: . sodium chloride   Intravenous Once  . amLODipine  10 mg Oral Daily  . Chlorhexidine Gluconate Cloth  6 each Topical Daily  . Chlorhexidine Gluconate Cloth  6 each Topical Q0600  . Chlorhexidine Gluconate Cloth  6 each Topical Q0600  . cloNIDine  0.2 mg Oral BID  . darbepoetin (ARANESP) injection - DIALYSIS  150 mcg Intravenous Q Tue-HD  . gabapentin  100 mg Oral QHS  . hydrOXYzine  25 mg Oral BID  . insulin aspart  0-9 Units Subcutaneous Q4H  . lanthanum  1,000 mg Oral TID WC  . pantoprazole  40 mg Oral BID  . predniSONE  40 mg Oral Q breakfast  . senna-docusate  2 tablet Oral QHS   Continuous Infusions: . sodium chloride 200 mL/hr at 08/10/20 1604  . sodium chloride    . sodium chloride    . sodium thiosulfate infusion for calciphylaxis Stopped (08/14/20 1356)    Consultants:see note  Procedures:see note  Antimicrobials: Anti-infectives (From admission, onward)   Start     Dose/Rate Route Frequency Ordered Stop   08/10/20 1000  ceFAZolin (ANCEF) IVPB 2g/100 mL premix  Status:  Discontinued        2 g 200 mL/hr over 30 Minutes Intravenous To Short Stay 08/10/20 0716 08/10/20 1114     Culture/Microbiology    Component Value Date/Time   SDES BLOOD SITE NOT SPECIFIED 07/07/2020 1025   SPECREQUEST  07/07/2020 1025    BOTTLES DRAWN AEROBIC AND ANAEROBIC Blood Culture adequate volume   CULT  07/07/2020 1025    NO GROWTH 5 DAYS Performed at [facility], [place] [address] [place], [place] [address]    REPTSTATUS 07/12/2020 FINAL 07/07/2020 1025    Other culture-see note  Objective: Vitals: Today's Vitals   08/15/20 1245 08/15/20 1250 08/15/20 1652 08/15/20 1802  BP:  (!) 124/52 (!) 125/53 (!) 105/51   Pulse: 64 63 62   Resp: 18 17 18    Temp:   98.2 F (36.8 C)   TempSrc:      SpO2: 92% 93% 97%   Weight:      Height:  PainSc:    0-No pain    Intake/Output Summary (Last 24 hours) at 08/15/2020 1806 Last data filed at 08/15/2020 1800 Gross per 24 hour  Intake 240 ml  Output 200 ml  Net 40 ml   Filed Weights   08/13/20 2022 08/14/20 0720 08/14/20 1130  Weight: 121.1 kg 120.4 kg 118.4 kg   Weight change: -0.7 kg  Intake/Output from previous day: 04/19 0701 - 04/20 0700 In: 660 [P.O.:660] Out: 2069  Intake/Output this shift: Total I/O In: 240 [P.O.:240] Out: 200 [Urine:200] Filed Weights   08/13/20 2022 08/14/20 0720 08/14/20 1130  Weight: 121.1 kg 120.4 kg 118.4 kg    Examination:  General exam: AAO ,NAD, weak appearing. HEENT:NCAT, PERRL Respiratory system: bilaterally diminished,no use of accessory muscle, non tender. Cardiovascular system: S1 & S2 +, regular Gastrointestinal system: Abdomen soft, NT,ND, BS+. Nervous System:Alert, awake, moving extremities and grossly nonfocal Extremities: mild peripheral edema, distal peripheral pulses palpable.  Skin: hyperpigmented nodular lesions on lower extremity, pruritis MSK: Normal muscle bulk,tone, power  Data Reviewed: I have personally reviewed following labs and imaging studies CBC: Recent Labs  Lab 08/09/20 1440 08/10/20 0117 08/11/20 0908 08/12/20 0021 08/12/20 2152 08/13/20 0808 08/13/20 1618 08/14/20 0417 08/14/20 1549 08/14/20 2229 08/15/20 0544  WBC 5.8   < > 8.0 6.0  --  5.3  --  6.1  --   --  6.7  NEUTROABS 3.4  --   --   --   --   --   --   --   --   --   --   HGB 7.1*   < > 7.4* 6.6*   < > 8.1*  8.1* 7.9* 7.6* 8.1* 7.9* 8.1*  HCT 22.8*   < > 22.5* 20.2*   < > 24.5*  25.3* 24.3* 23.8* 24.6* 24.3* 25.0*  MCV 111.8*   < > 99.1 100.0  --  99.6  --  100.8*  --   --  98.4  PLT 169   < > 120* 105*  --  104*  --  100*  --   --  89*   < > = values in this  interval not displayed.   Basic Metabolic Panel: Recent Labs  Lab 08/11/20 0240 08/12/20 0021 08/13/20 0808 08/14/20 1001 08/14/20 2229 08/15/20 0544  NA 139 140 137 138  --  140  K 5.2* 4.8 3.9 4.3 3.5 3.8  CL 102 102 101 101  --  98  CO2 18* 23 25 23   --  23  GLUCOSE 83 104* 135* 108*  --  125*  BUN 134* 123* 68* 86*  --  37*  CREATININE 12.07* 11.59* 9.15* 11.26*  --  6.91*  CALCIUM 8.3* 8.2* 8.2* 7.9*  --  8.1*  MG  --   --   --   --  2.0  --   PHOS  --  9.9*  --  9.2*  --   --    GFR: Estimated Creatinine Clearance: 14.9 mL/min (A) (by C-G formula based on SCr of 6.91 mg/dL (H)). Liver Function Tests: Recent Labs  Lab 08/12/20 0021 08/14/20 1001  AST 39  --   ALT 16  --   ALKPHOS 38  --   BILITOT 0.7  --   PROT 5.9*  --   ALBUMIN 2.1* 2.1*   No results for input(s): LIPASE, AMYLASE in the last 168 hours. No results for input(s): AMMONIA in the last 168 hours. Coagulation Profile: Recent Labs  Lab  08/09/20 1524  INR 1.1   CBG: Recent Labs  Lab 08/14/20 1659 08/14/20 2013 08/15/20 0817 08/15/20 1118 08/15/20 1648  GLUCAP 156* 143* 135* 114* 128*     Recent Results (from the past 240 hour(s))  Resp Panel by RT-PCR (Flu A&B, Covid) Nasopharyngeal Swab     Status: None   Collection Time: 08/10/20  2:58 AM   Specimen: Nasopharyngeal Swab; Nasopharyngeal(NP) swabs in vial transport medium  Result Value Ref Range Status   SARS Coronavirus 2 by RT PCR NEGATIVE NEGATIVE Final    Comment: (NOTE) SARS-CoV-2 target nucleic acids are NOT DETECTED.  The SARS-CoV-2 RNA is generally detectable in upper respiratory specimens during the acute phase of infection. The lowest concentration of SARS-CoV-2 viral copies this assay can detect is 138 copies/mL. A negative result does not preclude SARS-Cov-2 infection and should not be used as the sole basis for treatment or other patient management decisions. A negative result may occur with  improper specimen  collection/handling, submission of specimen other than nasopharyngeal swab, presence of viral mutation(s) within the areas targeted by this assay, and inadequate number of viral copies(<138 copies/mL). A negative result must be combined with clinical observations, patient history, and epidemiological information. The expected result is Negative.  Fact Sheet for Patients:  EntrepreneurPulse.com.au  Fact Sheet for Healthcare Providers:  IncredibleEmployment.be  This test is no t yet approved or cleared by the [place] FDA and  has been authorized for detection and/or diagnosis of SARS-CoV-2 by FDA under an Emergency Use Authorization (EUA). This EUA will remain  in effect (meaning this test can be used) for the duration of the COVID-19 declaration under Section 564(b)(1) of the Act, 21 U.S.C.section 360bbb-3(b)(1), unless the authorization is terminated  or revoked sooner.       Influenza A by PCR NEGATIVE NEGATIVE Final   Influenza B by PCR NEGATIVE NEGATIVE Final    Comment: (NOTE) The Xpert Xpress SARS-CoV-2/FLU/RSV plus assay is intended as an aid in the diagnosis of influenza from Nasopharyngeal swab specimens and should not be used as a sole basis for treatment. Nasal washings and aspirates are unacceptable for Xpert Xpress SARS-CoV-2/FLU/RSV testing.  Fact Sheet for Patients: EntrepreneurPulse.com.au  Fact Sheet for Healthcare Providers: IncredibleEmployment.be  This test is not yet approved or cleared by the [place] FDA and has been authorized for detection and/or diagnosis of SARS-CoV-2 by FDA under an Emergency Use Authorization (EUA). This EUA will remain in effect (meaning this test can be used) for the duration of the COVID-19 declaration under Section 564(b)(1) of the Act, 21 U.S.C. section 360bbb-3(b)(1), unless the authorization is terminated or revoked.  Performed at [facility], [place] [address] [place], [place] [address]      Radiology Studies: PERIPHERAL VASCULAR CATHETERIZATION  Result Date: 08/15/2020 DATE OF SERVICE: 08/15/2020  PATIENT:  Daniel [name]  60 y.o. male  PRE-OPERATIVE DIAGNOSIS:  Pulsatile left upper extremity arteriovenous fistula, recent traumatic cannulation of AVF.  POST-OPERATIVE DIAGNOSIS:  Same  PROCEDURE:  1) left upper extremity fistulagram (24mL contrast) 2) left cephalic arch stenting and angioplasty (8x19mm Viabahn) 3) left AVF angioplasty (9x61mm Athletis) 4) Conscious sedation (17 minutes)  SURGEON:  Surgeon(s) and Role:    * Cherre Robins, MD - Primary  ASSISTANT: none  ANESTHESIA:   local and IV sedation  EBL: min  BLOOD ADMINISTERED:none  DRAINS: none  LOCAL MEDICATIONS USED:  LIDOCAINE  SPECIMEN:  none  COUNTS: confirmed correct.  TOURNIQUET:  None  PATIENT  DISPOSITION:  PACU - hemodynamically stable.  Delay start of Pharmacological VTE agent (>24hrs) due to surgical blood loss or risk of bleeding: no  INDICATION FOR PROCEDURE: QUINTELL BONNIN is a 60 y.o. male with pulsatile left upper extremity arteriovenous fistula and recent traumatic cannulation. After careful discussion of risks, benefits, and alternatives the patient was offered fistulagram with possible intervention. The patient understood and wished to proceed.  OPERATIVE FINDINGS: severe left cephalic arch stenosis, 20% resolution with angioplasty, resolved with stenting. ~75% AVF stenosis, 20% residual after angioplasty. Much improved thrill in AVF on completion.  DESCRIPTION OF PROCEDURE: After identification of the patient in the pre-operative holding area, the patient was transferred to the operating room. The patient was positioned supine on the operating room table. Anesthesia was induced. The left arm was prepped and draped in standard fashion. A surgical pause was performed confirming correct patient, procedure, and operative location.   Using micropuncture technique, the left upper extremity AV fistula was accessed and a 4 French sheath placed in the fistula.  Fistulogram's were performed in stations.  This identified an AV fistula stenosis as well as the cephalic arch stenosis.  There appeared to be no stenosis in the central veins.  Initially we had great difficulty tracking a Bentson wire and at the let us balloon across the cephalic arch stenosis.  The wire was withdrawn and we reattempted to access the central veins.  Using a series of wire and catheter exchanges an 018 V18 wire was advanced into the central veins.  Over the wire angioplasty was performed of the cephalic arch using an 8 x 60 Athletics balloon.  The cephalic arch stenosis did not resolve with angioplasty.  I elected to treat this with a 8 x 50 mm Viabahn stent.  This was postdilated with a 9 x 40 mm Atlas balloon.  Resolution of the cephalic arch stenosis was noted.  The AV fistula stenosis was then treated with the same 9 x 40 mm [place] balloon.  The 75% stenosis did not complete resolve.  About 20% residual stenosis was noted.  Thrill was much improved in the fistula.  We ended the case here.  The wire, balloon, and sheath were withdrawn.  A figure-of-eight Monocryl suture was placed at the access site.  Hemostasis was achieved.  Conscious sedation was administered with the use of IV fentanyl and midazolam under continuous physician and nurse monitoring. Heart rate, blood pressure, and oxygen saturation were continuously monitored. Total sedation time was 18 minutes  Upon completion of the case instrument and sharps counts were confirmed correct. The patient was transferred to the PACU in good condition. I was present for all portions of the procedure.  Yevonne Aline. Stanford Breed, MD [facility] Phone Number: [phone] 08/15/2020 12:49 PM     LOS: 4 days   George Hugh, MD [facility]  08/15/2020, 6:06 PM

## 2020-08-15 NOTE — Progress Notes (Signed)
[facility] Progress Note   Subjective:   Reports dialysis went better yesterday. Reports pain in b/l arms and legs but worse in R wrist. Continues to feel weak. Itching is improved. Denies SOB, CP, palpitations, dizziness, abdominal pain and nausea.   Objective Vitals:   08/14/20 1215 08/14/20 1701 08/14/20 2007 08/15/20 0618  BP: (!) 136/50 (!) 105/46 (!) 109/46 (!) 121/42  Pulse: 81 73 77 66  Resp: 19 19 18 18   Temp: 98.4 F (36.9 C) 98.4 F (36.9 C) 98 F (36.7 C) 98.3 F (36.8 C)  TempSrc:   Oral   SpO2: 100% 96% 99% 95%  Weight:      Height:       Physical Exam General: WDWN male, alert and in NAD Heart: RRR, no murmurs, rubs or gallops Lungs: CTA bilaterally without wheezing, rhonchi or rales Abdomen: Soft, non-tender, non-distended, +BS Extremities: scattered hyperpigmented nodules b/l lower extremities, scaly/peeling skin noted bottom of R foot. R wrist appears slightly swollen Dialysis Access: R IJ TDC, LUE AVF no palpable thrill  Additional Objective Labs: Basic Metabolic Panel: Recent Labs  Lab 08/12/20 0021 08/13/20 0808 08/14/20 1001 08/14/20 2229 08/15/20 0544  NA 140 137 138  --  140  K 4.8 3.9 4.3 3.5 3.8  CL 102 101 101  --  98  CO2 23 25 23   --  23  GLUCOSE 104* 135* 108*  --  125*  BUN 123* 68* 86*  --  37*  CREATININE 11.59* 9.15* 11.26*  --  6.91*  CALCIUM 8.2* 8.2* 7.9*  --  8.1*  PHOS 9.9*  --  9.2*  --   --    Liver Function Tests: Recent Labs  Lab 08/12/20 0021 08/14/20 1001  AST 39  --   ALT 16  --   ALKPHOS 38  --   BILITOT 0.7  --   PROT 5.9*  --   ALBUMIN 2.1* 2.1*   CBC: Recent Labs  Lab 08/09/20 1440 08/10/20 0117 08/11/20 0908 08/12/20 0021 08/12/20 2152 08/13/20 0808 08/13/20 1618 08/14/20 0417 08/14/20 1549 08/14/20 2229 08/15/20 0544  WBC 5.8   < > 8.0 6.0  --  5.3  --  6.1  --   --  6.7  NEUTROABS 3.4  --   --   --   --   --   --   --   --   --   --   HGB 7.1*   < > 7.4* 6.6*   < > 8.1*   8.1*   < > 7.6* 8.1* 7.9* 8.1*  HCT 22.8*   < > 22.5* 20.2*   < > 24.5*  25.3*   < > 23.8* 24.6* 24.3* 25.0*  MCV 111.8*   < > 99.1 100.0  --  99.6  --  100.8*  --   --  98.4  PLT 169   < > 120* 105*  --  104*  --  100*  --   --  89*   < > = values in this interval not displayed.   Blood Culture    Component Value Date/Time   SDES BLOOD SITE NOT SPECIFIED 07/07/2020 1025   SPECREQUEST  07/07/2020 1025    BOTTLES DRAWN AEROBIC AND ANAEROBIC Blood Culture adequate volume   CULT  07/07/2020 1025    NO GROWTH 5 DAYS Performed at [facility], [place] [address] [place], [place] [address]    REPTSTATUS 07/12/2020 FINAL 07/07/2020 1025   CBG:  Recent Labs  Lab 08/14/20 0433 08/14/20 1216 08/14/20 1659 08/14/20 2013 08/15/20 0817  GLUCAP 104* 119* 156* 143* 135*   Medications: . sodium chloride 200 mL/hr at 08/10/20 1604  . sodium chloride    . sodium chloride    . sodium thiosulfate infusion for calciphylaxis Stopped (08/14/20 1356)   . sodium chloride   Intravenous Once  . amLODipine  10 mg Oral Daily  . Chlorhexidine Gluconate Cloth  6 each Topical Daily  . Chlorhexidine Gluconate Cloth  6 each Topical Q0600  . cloNIDine  0.2 mg Oral BID  . darbepoetin (ARANESP) injection - DIALYSIS  150 mcg Intravenous Q Tue-HD  . gabapentin  100 mg Oral QHS  . insulin aspart  0-9 Units Subcutaneous Q4H  . lanthanum  1,000 mg Oral TID WC  . 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 Body-wide itching has been an ongoing issue since starting dialysis. Patient does have historyofpenile ulcer (04/2020 in [facility] records) due to Calciphylaxis.Due to severity of pain and extensive calcifications seen throughout LLE on CXR, decision was made to treat as  calciphylaxis and start sodium thiosulfate with HD.Last PTH: 388 (07/12/20 outpatient hemodialysis).Will d/c hectorol for now. Binder changed to fosrenol. Calcium and phos slowly improving.  2.GIB- Patient underwent upper endoscopy4/15 via Dr. Hilarie Fredrickson: Normal esophagus, gastritis, and bleeding duodenal ulcer with a visible vessel. Clips were placed X 3 with apparent hemostasis.Has required 3 units PRBC to date. Continue aranesp q Tuesday.Will continue to monitor Hgb trends. 3.Hematoma L AVF-Difficulty accessing AVF, new TDC placed by VVS with plans for fistulogram today 4.ESRD- HD TTS. Continue current schedule . 5. Weakness: Pt reports feeling weaker lately. Morphine changed to dilaudid due to ESRD. PT evaluating patient today.  6.Hypertension/volume-Blood pressures are controlled.2kg over EDW but appears close to euvolemic on exam. UF with HD as tolerated.   7.Secondary Hyperparathyroidism -Corr [place]rolled. Phos elevated. Stopping hectorol and changing to non-calcium binder due to concern for calciphylaxis. PTH level pending. 8.Nutrition-Renal diet with fluid restriction   Anice Paganini, PA-C 08/15/2020, 8:46 AM  [facility] Pager: [phone]

## 2020-08-16 LAB — BASIC METABOLIC PANEL
Anion gap: 18 — ABNORMAL HIGH (ref 5–15)
BUN: 48 mg/dL — ABNORMAL HIGH (ref 6–20)
CO2: 22 mmol/L (ref 22–32)
Calcium: 8.5 mg/dL — ABNORMAL LOW (ref 8.9–10.3)
Chloride: 98 mmol/L (ref 98–111)
Creatinine, Ser: 8.81 mg/dL — ABNORMAL HIGH (ref 0.61–1.24)
GFR, Estimated: 6 mL/min — ABNORMAL LOW (ref >60)
Glucose, Bld: 127 mg/dL — ABNORMAL HIGH (ref 70–99)
Potassium: 3.8 mmol/L (ref 3.5–5.1)
Sodium: 138 mmol/L (ref 135–145)

## 2020-08-16 LAB — CBC
HCT: 24.4 % — ABNORMAL LOW (ref 39.0–52.0)
Hemoglobin: 7.8 g/dL — ABNORMAL LOW (ref 13.0–17.0)
MCH: 31.7 pg (ref 26.0–34.0)
MCHC: 32 g/dL (ref 30.0–36.0)
MCV: 99.2 fL (ref 80.0–100.0)
Platelets: 101 10*3/uL — ABNORMAL LOW (ref 150–400)
RBC: 2.46 MIL/uL — ABNORMAL LOW (ref 4.22–5.81)
RDW: 19.6 % — ABNORMAL HIGH (ref 11.5–15.5)
WBC: 6.5 10*3/uL (ref 4.0–10.5)
nRBC: 0.5 % — ABNORMAL HIGH (ref 0.0–0.2)

## 2020-08-16 LAB — GLUCOSE, CAPILLARY
Glucose-Capillary: 97 mg/dL (ref 70–99)
Glucose-Capillary: 110 mg/dL — ABNORMAL HIGH (ref 70–99)

## 2020-08-16 MED ORDER — HEPARIN SODIUM (PORCINE) 1000 UNIT/ML IJ SOLN
INTRAMUSCULAR | Status: AC
  Filled 2020-08-16: qty 4

## 2020-08-16 MED ORDER — GABAPENTIN 100 MG PO CAPS: 100.0000 mg | ORAL_CAPSULE | Freq: Every day | ORAL | 0 refills | Status: DC

## 2020-08-16 MED ORDER — PANTOPRAZOLE SODIUM 40 MG PO TBEC: 40.0000 mg | DELAYED_RELEASE_TABLET | Freq: Two times a day (BID) | ORAL | 0 refills | Status: DC

## 2020-08-16 MED ORDER — LANTHANUM CARBONATE 1000 MG PO CHEW: 1000.0000 mg | CHEWABLE_TABLET | Freq: Three times a day (TID) | ORAL | 0 refills | Status: AC

## 2020-08-16 MED FILL — Heparin Sod (Porcine)-NaCl IV Soln 1000 Unit/500ML-0.9%: INTRAVENOUS | Qty: 500 | Status: AC

## 2020-08-16 MED FILL — Lidocaine HCl Local Preservative Free (PF) Inj 1%: INTRAMUSCULAR | Qty: 30 | Status: AC

## 2020-08-16 NOTE — Progress Notes (Addendum)
[facility] Progress Note   Subjective:   Pt seen on HD, reports he is going home today. Wrist pain is slightly better. Reports his legs still feel heavy and he has pain in his feet that is worse with walking. Itching overall improved. No SOB, CP, palpitations, dizziness or nausea.   Objective Vitals:   08/16/20 0815 08/16/20 0820 08/16/20 0825 08/16/20 0830  BP: 139/82 138/80 138/80 (!) 145/111  Pulse: 69  66 67  Resp: 17  16 17   Temp: 98.8 F (37.1 C)     TempSrc:      SpO2:      Weight:      Height:       Physical Exam General: WDWN male, alert and in NAD Heart: RRR, no murmurs, rubs or gallops Lungs: CTA bilaterally without wheezing, rhonchi or rales Abdomen: Soft, non-tender, non-distended, +BS Extremities: scattered hyperpigmented nodules b/l lower extremities, scaly/peeling skin noted bottom of R foot. Feet warmed with decreased pedal pulses.  Dialysis Access: R IJ TDC, LUE AVF + thrill/bruit, mildly swollen/bruised  Additional Objective Labs: Basic Metabolic Panel: Recent Labs  Lab 08/12/20 0021 08/13/20 0808 08/14/20 1001 08/14/20 2229 08/15/20 0544 08/16/20 0309  NA 140   < > 138  --  140 138  K 4.8   < > 4.3 3.5 3.8 3.8  CL 102   < > 101  --  98 98  CO2 23   < > 23  --  23 22  GLUCOSE 104*   < > 108*  --  125* 127*  BUN 123*   < > 86*  --  37* 48*  CREATININE 11.59*   < > 11.26*  --  6.91* 8.81*  CALCIUM 8.2*   < > 7.9*  --  8.1* 8.5*  PHOS 9.9*  --  9.2*  --   --   --    < > = values in this interval not displayed.   Liver Function Tests: Recent Labs  Lab 08/12/20 0021 08/14/20 1001  AST 39  --   ALT 16  --   ALKPHOS 38  --   BILITOT 0.7  --   PROT 5.9*  --   ALBUMIN 2.1* 2.1*   No results for input(s): LIPASE, AMYLASE in the last 168 hours. CBC: Recent Labs  Lab 08/09/20 1440 08/10/20 0117 08/12/20 0021 08/12/20 2152 08/13/20 0808 08/13/20 1618 08/14/20 0417 08/14/20 1549 08/14/20 2229 08/15/20 0544 08/16/20 0309   WBC 5.8   < > 6.0  --  5.3  --  6.1  --   --  6.7 6.5  NEUTROABS 3.4  --   --   --   --   --   --   --   --   --   --   HGB 7.1*   < > 6.6*   < > 8.1*  8.1*   < > 7.6*   < > 7.9* 8.1* 7.8*  HCT 22.8*   < > 20.2*   < > 24.5*  25.3*   < > 23.8*   < > 24.3* 25.0* 24.4*  MCV 111.8*   < > 100.0  --  99.6  --  100.8*  --   --  98.4 99.2  PLT 169   < > 105*  --  104*  --  100*  --   --  89* 101*   < > = values in this interval not displayed.   Blood Culture    Component  Value Date/Time   SDES BLOOD SITE NOT SPECIFIED 07/07/2020 1025   SPECREQUEST  07/07/2020 1025    BOTTLES DRAWN AEROBIC AND ANAEROBIC Blood Culture adequate volume   CULT  07/07/2020 1025    NO GROWTH 5 DAYS Performed at [facility], [place] [address], [place], [place] [address]    REPTSTATUS 07/12/2020 FINAL 07/07/2020 1025    Cardiac Enzymes: No results for input(s): CKTOTAL, CKMB, CKMBINDEX, TROPONINI in the last 168 hours. CBG: Recent Labs  Lab 08/15/20 1118 08/15/20 1648 08/15/20 2000 08/15/20 2049 08/16/20 0644  GLUCAP 114* 128* 173* 122* 97   Iron Studies: No results for input(s): IRON, TIBC, TRANSFERRIN, FERRITIN in the last 72 hours. @lablastinr3 @ Studies/Results: PERIPHERAL VASCULAR CATHETERIZATION  Result Date: 08/15/2020 DATE OF SERVICE: 08/15/2020  PATIENT:  Leslye Peer Cromie  60 y.o. male  PRE-OPERATIVE DIAGNOSIS:  Pulsatile left upper extremity arteriovenous fistula, recent traumatic cannulation of AVF.  POST-OPERATIVE DIAGNOSIS:  Same  PROCEDURE:  1) left upper extremity fistulagram (6mL contrast) 2) left cephalic arch stenting and angioplasty (8x70mm Viabahn) 3) left AVF angioplasty (9x36mm Athletis) 4) Conscious sedation (17 minutes)  SURGEON:  Surgeon(s) and Role:    * Cherre Robins, MD - Primary  ASSISTANT: none  ANESTHESIA:   local and IV sedation  EBL: min  BLOOD ADMINISTERED:none  DRAINS: none  LOCAL MEDICATIONS USED:  LIDOCAINE  SPECIMEN:  none  COUNTS: confirmed correct.   TOURNIQUET:  None  PATIENT DISPOSITION:  PACU - hemodynamically stable.  Delay start of Pharmacological VTE agent (>24hrs) due to surgical blood loss or risk of bleeding: no  INDICATION FOR PROCEDURE: AVRAM DANIELSON is a 60 y.o. male with pulsatile left upper extremity arteriovenous fistula and recent traumatic cannulation. After careful discussion of risks, benefits, and alternatives the patient was offered fistulagram with possible intervention. The patient understood and wished to proceed.  OPERATIVE FINDINGS: severe left cephalic arch stenosis, 12% resolution with angioplasty, resolved with stenting. ~75% AVF stenosis, 20% residual after angioplasty. Much improved thrill in AVF on completion.  DESCRIPTION OF PROCEDURE: After identification of the patient in the pre-operative holding area, the patient was transferred to the operating room. The patient was positioned supine on the operating room table. Anesthesia was induced. The left arm was prepped and draped in standard fashion. A surgical pause was performed confirming correct patient, procedure, and operative location.  Using micropuncture technique, the left upper extremity AV fistula was accessed and a 4 French sheath placed in the fistula.  Fistulogram's were performed in stations.  This identified an AV fistula stenosis as well as the cephalic arch stenosis.  There appeared to be no stenosis in the central veins.  Initially we had great difficulty tracking a Bentson wire and at the let us balloon across the cephalic arch stenosis.  The wire was withdrawn and we reattempted to access the central veins.  Using a series of wire and catheter exchanges an 018 V18 wire was advanced into the central veins.  Over the wire angioplasty was performed of the cephalic arch using an 8 x 60 Athletics balloon.  The cephalic arch stenosis did not resolve with angioplasty.  I elected to treat this with a 8 x 50 mm Viabahn stent.  This was postdilated with a 9 x  40 mm Atlas balloon.  Resolution of the cephalic arch stenosis was noted.  The AV fistula stenosis was then treated with the same 9 x 40 mm Atlas balloon.  The 75% stenosis did not complete resolve.  About 20% residual stenosis was noted.  Thrill was much improved in the fistula.  We ended the case here.  The wire, balloon, and sheath were withdrawn.  A figure-of-eight Monocryl suture was placed at the access site.  Hemostasis was achieved.  Conscious sedation was administered with the use of IV fentanyl and midazolam under continuous physician and nurse monitoring. Heart rate, blood pressure, and oxygen saturation were continuously monitored. Total sedation time was 18 minutes  Upon completion of the case instrument and sharps counts were confirmed correct. The patient was transferred to the PACU in good condition. I was present for all portions of the procedure.  Yevonne Aline. Stanford Breed, MD [facility] Phone Number: [phone] 08/15/2020 12:49 PM   Medications: . sodium chloride 200 mL/hr at 08/10/20 1604  . sodium chloride    . sodium chloride    . sodium thiosulfate infusion for calciphylaxis Stopped (08/14/20 1356)   . sodium chloride   Intravenous Once  . amLODipine  10 mg Oral Daily  . Chlorhexidine Gluconate Cloth  6 each Topical Daily  . Chlorhexidine Gluconate Cloth  6 each Topical Q0600  . Chlorhexidine Gluconate Cloth  6 each Topical Q0600  . cloNIDine  0.2 mg Oral BID  . darbepoetin (ARANESP) injection - DIALYSIS  150 mcg Intravenous Q Tue-HD  . gabapentin  100 mg Oral QHS  . hydrOXYzine  25 mg Oral BID  . insulin aspart  0-9 Units Subcutaneous Q4H  . lanthanum  1,000 mg Oral TID WC  . pantoprazole  40 mg Oral BID  . predniSONE  40 mg Oral Q breakfast  . senna-docusate  2 tablet Oral QHS    Dialysis Orders: TTS - [facility] 4hrs, [id number], [id number], EDW 116.5kg,3K/2.5Ca  Assessment/Plan: 1. GIB- Patient underwent upper  endoscopy4/15 via Dr. Hilarie Fredrickson: Normal esophagus, gastritis, and bleeding duodenal ulcer with a visible vessel. Clips were placed X 3 with apparent hemostasis.Has required 3 units PRBC to date. 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 The skin lesions on the lower extremity are not c/w calciphylaxis. Last PTH: 388 (07/12/20 outpatient Have d/c'd hectorol for now.Binder changed to fosrenol. Calcium and phos slowly improving.  3. Hematoma L AVF-Difficulty accessing AVF, new TDC placed by [facility] and s/p fistulogram with angioplasty on 08/15/20.  4. ESRD- HD TTS.Continue current schedule . 5. Weakness: Pt reports feeling weakerlately. Morphine changed to dilaudid due to ESRD.Plans for home PT.  6. Hypertension/volume-Blood pressures are controlled.2kg over EDW but appears close to euvolemic on exam.UF with HD as tolerated.  7. Secondary Hyperparathyroidism -Corr Cacontrolled. Phos elevated. Stopping hectorol and changing to non-calcium binder due to concern for calciphylaxis. PTH level pending. 8. Nutrition-Renal diet with fluid restriction 9. Claudication-like symptoms: Reports foot pain worse with walking. Poor pulses in the feet, but no ischemic changes on exam. We are concerned about possible claudication from PAD. We will contact [facility] to arrange outpatient referral to evaluate.   Anice Paganini, PA-C 08/16/2020, 8:56 AM  [facility] Pager: [phone]  Pt seen, examined and agree w assess/plan as above with additions as indicated.  [facility] 08/16/2020, 11:44 AM

## 2020-08-16 NOTE — Progress Notes (Signed)
PT Cancellation Note  Patient Details Name: JABRON WEESE MRN: [id number] DOB: 1960/09/13   Cancelled Treatment:    Reason Eval/Treat Not Completed: Patient at procedure or test/unavailable currently in HD- will attempt to return later if time/schedule allow    Windell Norfolk, DPT, PN1   Supplemental Physical Therapist [place]    Pager [phone] Acute Rehab Office [phone]

## 2020-08-16 NOTE — Progress Notes (Signed)
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  EGD showed actively bleeding 9 mm duodenal ulcer and 3 clips were placed. Noted some issues with fistula for HD access.    PT Comments    Pt received sitting at EOB, eager to return home. No evidence of R wrist pain. Pt moving well with RW. Steady with no LOB. Able to navigate stairs with use of rail at min guard level and will have family present to help him complete stairs at home. Recommend that pt continue to use RW at home and especially for longer distances. Pt demonstrated understanding. Left sitting in bed with all needs met and call bell within reach. Will continue to follow acutely.    Follow Up Recommendations  No PT follow up     Equipment Recommendations  None recommended by PT    Recommendations for Other Services       Precautions / Restrictions Precautions Precautions: Fall Precaution Comments: Fall risk greatly reduced with use of RW Restrictions Weight Bearing Restrictions: No    Mobility  Bed Mobility               General bed mobility comments: Pt received in sitting    Transfers Overall transfer level: Needs assistance Equipment used: Rolling walker (2 wheeled) Transfers: Sit to/from Stand Sit to Stand: Supervision         General transfer comment: cueing for hand placement  Ambulation/Gait Ambulation/Gait assistance: Supervision Gait Distance (Feet): 110 Feet Assistive device: Rolling walker (2 wheeled) Gait Pattern/deviations: Step-through pattern;Decreased stride length         Stairs Stairs: Yes Stairs assistance: Min guard Stair Management: One rail Right;Step to pattern;Alternating pattern;Forwards Number of  Stairs: 10 General stair comments: Used an alternating pattern for ascending the flight, step to pattern for descending, min guard for safety, no LOB   Wheelchair Mobility    Modified Rankin (Stroke Patients Only)       Balance Overall balance assessment: Needs assistance   Sitting balance-Leahy Scale: Good     Standing balance support: Bilateral upper extremity supported;During functional activity Standing balance-Leahy Scale: Fair Standing balance comment: Benefits from UE support for standing mobility                            Cognition Arousal/Alertness: Awake/alert Behavior During Therapy: WFL for tasks assessed/performed Overall Cognitive Status: Within Functional Limits for tasks assessed                                        Exercises      General Comments General comments (skin integrity, edema, etc.): VSS on RA      Pertinent Vitals/Pain Pain Assessment: Faces Faces Pain Scale: No hurt Pain Intervention(s): Monitored during session     PT Goals (current goals can now be found in the care plan section) Acute Rehab PT Goals Patient Stated Goal: Hopes to be home soon PT Goal Formulation: With patient Time For Goal Achievement: 08/29/20 Potential to Achieve Goals: Good Progress towards PT goals: Progressing toward goals    Frequency  Min 3X/week      PT Plan Current plan remains appropriate    Co-evaluation              AM-PAC PT "6 Clicks" Mobility   Outcome Measure  Help needed turning from your back to your side while in a flat bed without using bedrails?: None Help needed moving from lying on your back to sitting on the side of a flat bed without using bedrails?: None Help needed moving to and from a bed to a chair (including a wheelchair)?: A Little Help needed standing up from a chair using your arms (e.g., wheelchair or bedside chair)?: A Little Help needed to walk in hospital room?: A Little Help  needed climbing 3-5 steps with a railing? : A Little 6 Click Score: 20    End of Session Equipment Utilized During Treatment: Gait belt Activity Tolerance: Patient tolerated treatment well Patient left: with call bell/phone within reach;in bed Nurse Communication: Mobility status PT Visit Diagnosis: Other abnormalities of gait and mobility (R26.89);Pain Pain - Right/Left: Right Pain - part of body:  (wrist)     Time:  -     Charges:                        Rosita Kea, SPT

## 2020-08-16 NOTE — Discharge Summary (Signed)
Discharge Summary  Daniel [name] LOV:[id number] DOB: June 14, 1960  PCP: Daniel Perna, NP  Admit date: 08/09/2020 Discharge date: 08/16/2020  Time spent: 80mns, more than 50% time spent on coordination of care.   Recommendations for Outpatient Follow-up:  1. F/u with PCP within a week  for hospital discharge follow up, repeat cbc/bmp at follow up 2. F/u with [name], may need to repeat EGD, f/u with [name] to follow up h pylori test result 3. F/u with Daniel surgery Dr Daniel Breedfor left foot pain ( concerning for PAD), left avf 4. Continue HD TTS, follow up with nephrology 5. F/u with dermatology for skin rash  New meds at discharge:  ppi bid Start fosrenol Sodium thisulfate during dialysis TTS Decrease neurontin D/c calcium acetate  Discharge Diagnoses:  Active Hospital Problems   Diagnosis Date Noted  . Acute [name] bleeding 08/10/2020  . Gastrointestinal hemorrhage with melena   . Duodenal ulcer hemorrhage   . ESRD (end stage renal disease) ([facility]   . Anemia   . Hypertension 06/29/2016  . DM2 (diabetes mellitus, type 2) ([facility] 06/29/2016    Resolved Hospital Problems  No resolved problems to display.    Discharge Condition: stable  Diet recommendation: renal /carb modified  Filed Weights   08/14/20 1130 08/16/20 0810 08/16/20 1225  Weight: 118.4 kg 113.4 kg 111 kg    History of present illness: (Per admitting MD Dr. KHal [name]) Chief Complaint: Left upper extremity pain.  HPI: RRITCHARD PARAGASis a 60y.o. male with history of ESRD on hemodialysis on Tuesday Thursdays and Saturday presents to the ER for the second time in the last 24 hours with increasing pain in the left upper extremity.  Patient was seen earlier today by Daniel surgeon in the ER after patient complained of increasing pain after dialysis.  Patient was planned to have outpatient tunneled catheter placed since patient's left AV fistula had developed a hematoma due to traumatic access.  Patient  comes again because of worsening pain.  On further questioning patient also states over the last 3 days he has been having black stools.  Patient also had some rash in the lower extremity.  Denies taking any NSAIDs.  And not on the aspirin.  ED Course: In the ER patient was having significant pain in the left upper extremity but pulses are palpable.  Stool for occult blood was positive.  Hemoglobin was around 8.2 earlier today to around 7.1.  Patient's baseline hemoglobin is around 9.  Covid test was negative.  Patient admitted for upper [name] bleed and also will need access for dialysis.  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  EGD showed actively bleeding 9 mm duodenal ulcer and 3 clips were placed.    On 08/03/2019, patient had a AV fistula placed in the left arm by Daniel [name] which deemed on nonfunctional on 4/16.   On 08/12/2020, Daniel placed tunneled dialysis catheter in the right IJV.   On 08/15/2020, Daniel performed left fistula angioplasty   Acute Duodenal Ulcer s/p 4/15 endoscopic clip placement: - S/P 72 hours  of of IV Protonix 40 mg BID. Continue Protonix 40 mg PO BID x 30 days.  - Hold blood thinners. - Follow up on H. Pylori results. - Follow up with Daniel [name] (Dr. Havery [name]) outpatient for repeat EGD in 6 weeks.  ESRD on dialysis TTS with left brachiocephalic AVF in LUE with small hematoma formation:   - Patient had Fistula Angioplasty on 4/20, appreciate Daniel. - Continue dialysis TTS.  -he is cleared to discharge with outpatient follow up with  Nephrology and Daniel surgery   Anemia/Blood  loss anemia from [name] with anemia of CKD: -Hemoglobin 5.4 on presentation on 4/15 s/p prbc transfusion x2 on 4/15, -hgb 6.6 on 4/17 x1 on 4/17 Hb 6.6 s/p 1 unit pRBC with dialysis. - There has been no acute  bleeding s/p 4/15 EGD and HH has been stable around 8 for the last few days - Continue ESA with dialysis.  Chronic Hypertension: - BP is stable on current regimen, Norvasc 10 mg daily, clonidine 0.2 mg BID, avoid beta-blocker due to history of cocaine use  Noninsulin-dependent Diabetes Mellitus Type 2: -Diet controlled ,not on meds at home -He did not need much sliding scale insulin in the hospital either   Nodular Rash in lower extremities - dermatosis vs calciphylaxis given patient's history:  - Given symmetric distribution, pruritis, hyperpigmented nodules with xerosis, and nail findings - this looks like nonspecific cutaneous manifestations commonly seen in ESRD.   - Continue Gabapentin 100 mg daily for pain complaints and pruritis.   - Continue sarna to moisturize dry skin. - Continue Atarax 25 mg PO BID for pruritis and as treatment of dermatosis. - Given the patient's significant pain complaints, history of calciphylaxis and Xray findings, continue sodium thiosulfate 25 mg in the last hour of dialysis on TTS. F/u with dermatology  History of penile calciphylaxis: - Seen by Urology Dr. Milford [name] and Dermatology in past.  See 04/2020 notes.  Acute Gout of Right Wrist: - treated with Prednisone 40 mg QD in the hospital, pain resolved, f/u with pcp  Physical Deconditioning: - PT eval, did not recommend PT follow up  History of Alcohol Use: - Counseled on abstinence.  Tabacco use : Smoking cessation education provided.  History of cocaine use: Avoid beta-blocker  Procedures:  PRBC transfusion x2 on  4/15 and x1 on 4/17  4/15: EGD  4/20:  1) left upper extremity fistulagram (22m contrast) 2) left cephalic arch stenting and angioplasty (8x587mViabahn) 3) left  AVF angioplasty (9x4074mthletis) 4) Conscious sedation (17 minutes)  Consultations:  [name]  Nephrology  Daniel surgery  Discharge Exam: BP 133/71 (BP Location: Right Arm)   Pulse 70   Temp 98.2 F (36.8 C) (Oral)   Resp 18   Ht 5' 10.98" (1.803 m)   Wt 111 kg   SpO2 96%   BMI 34.15 kg/m   General: NAD, AAOx3, tunneled dialysis catheter right chest, AV fistula left arm Cardiovascular: RRR Respiratory: Normal respiratory effort  Discharge Instructions You were cared for by a hospitalist during your hospital stay. If you have any questions about your discharge medications or the care you received while you were in the hospital after you are discharged, you can call the unit and asked to speak with the hospitalist on call if the hospitalist that took care of you is not available. Once you are discharged, your primary care physician will handle any further medical issues. Please note that NO REFILLS for any discharge medications will be authorized once you are  discharged, as it is imperative that you return to your primary care physician (or establish a relationship with a primary care physician if you do not have one) for your aftercare needs so that they can reassess your need for medications and monitor your lab values.  Discharge Instructions    Diet - low sodium heart healthy   Complete by: As directed    Renal diet   Discharge wound care:   Complete by: As directed    Per Daniel instruction   Increase activity slowly   Complete by: As directed      Allergies as of 08/16/2020      Reactions   Dilaudid [hydromorphone Hcl] Itching, Other (See Comments)   Pt reports itchiness after IM injection       Medication List    STOP taking these medications   Calcium Acetate 667 MG Tabs     TAKE these medications   amLODipine 10 MG tablet Commonly known as: NORVASC Take 10 mg by mouth daily.   blood glucose meter kit and supplies Kit Dispense based on patient and  insurance preference. Use up to four times daily as directed. (FOR ICD-9 250.00, 250.01).   cetirizine 10 MG tablet Commonly known as: ZyrTEC Allergy Take 1 tablet (10 mg total) by mouth daily. What changed:   when to take this  reasons to take this   cloNIDine 0.2 MG tablet Commonly known as: CATAPRES Take  1 tablet twice daily . If makes drowsy take 2 tablets - after he is off of work What changed:   how much to take  how to take this  when to take this  additional instructions   gabapentin 100 MG capsule Commonly known as: NEURONTIN Take 1 capsule (100 mg total) by mouth at bedtime. What changed:   medication strength  how much to take  when to take this  additional instructions   hydrOXYzine 25 MG tablet Commonly known as: ATARAX/VISTARIL Take 1 tablet (25 mg total) by mouth 2 (two) times daily as needed. What changed: reasons to take this   lanthanum 1000 MG chewable tablet Commonly known as: FOSRENOL Chew 1 tablet (1,000 mg total) by mouth 3 (three) times daily with meals.   lidocaine-prilocaine cream Commonly known as: EMLA Apply 1 application topically daily as needed (pain).   oxyCODONE-acetaminophen 5-325 MG tablet Commonly known as: PERCOCET/ROXICET Take 1 tablet by mouth every 6 (six) hours as needed for severe pain.   pantoprazole 40 MG tablet Commonly known as: PROTONIX Take 1 tablet (40 mg total) by mouth 2 (two) times daily.   triamcinolone cream 0.1 % Commonly known as: KENALOG Apply 1 application topically 2 (two) times daily.            Discharge Care Instructions  (From admission, onward)         Start     Ordered   08/16/20 0000  Discharge wound care:       Comments: Per Daniel instruction   08/16/20 1315         Allergies  Allergen Reactions  . Dilaudid [Hydromorphone Hcl] Itching and Other (See Comments)    Pt reports itchiness after IM injection     Follow-up Information    Daniel Perna, NP Follow  up in 1 week(s).   Specialty: Internal Medicine Why: hospital discharge follow up Contact information: [place] [address] [phone]        Cherre Robins, MD Follow up.   Specialties:  Daniel Surgery, Interventional Cardiology Why: for left foot pain, for left dialysis fistula check Contact information: [place] [address] [phone]                The results of significant diagnostics from this hospitalization (including imaging, microbiology, ancillary and laboratory) are listed below for reference.    Significant Diagnostic Studies: CT ABDOMEN PELVIS WO CONTRAST  Result Date: 08/10/2020 CLINICAL DATA:  Generalized abdominal pain, anemia. EXAM: CT ABDOMEN AND PELVIS WITHOUT CONTRAST TECHNIQUE: Multidetector CT imaging of the abdomen and pelvis was performed following the standard protocol without IV contrast. COMPARISON:  July 07, 2020. FINDINGS: Lower chest: No acute abnormality. Hepatobiliary: No focal liver abnormality is seen. No gallstones, gallbladder wall thickening, or biliary dilatation. Pancreas: Unremarkable. No pancreatic ductal dilatation or surrounding inflammatory changes. Spleen: Normal in size without focal abnormality. Adrenals/Urinary Tract: Adrenal glands appear normal. Small nonobstructive left renal calculus is noted. No hydronephrosis or renal obstruction is noted. Urinary bladder is unremarkable. Stomach/Bowel: The stomach appears normal. There is no evidence of bowel obstruction or inflammation. Daniel/Lymphatic: Aortic atherosclerosis. No enlarged abdominal or pelvic lymph nodes. Reproductive: Prostate is unremarkable. Other: No abdominal wall hernia or abnormality. No abdominopelvic ascites. Musculoskeletal: No acute or significant osseous findings. IMPRESSION: Small nonobstructive left renal calculus. No hydronephrosis or renal obstruction is noted. No acute abnormality seen in the abdomen or pelvis. Aortic  Atherosclerosis (ICD10-I70.0). Electronically Signed   By: Marijo Conception M.D.   On: 08/10/2020 12:52   DG Chest 2 View  Result Date: 08/04/2020 CLINICAL DATA:  Shortness of breath with bilateral foot pain. EXAM: CHEST - 2 VIEW COMPARISON:  July 07, 2020 FINDINGS: Mild, diffuse, chronic appearing increased interstitial lung markings are seen. There is no evidence of focal consolidation, pleural effusion or pneumothorax. The cardiac silhouette is mildly enlarged. Mild to moderate severity calcification of the aortic arch is seen. The visualized skeletal structures are unremarkable. IMPRESSION: Chronic appearing increased interstitial lung markings without evidence of acute or active cardiopulmonary disease. Electronically Signed   By: Virgina Norfolk M.D.   On: 08/04/2020 02:25   MR THORACIC SPINE WO CONTRAST  Result Date: 07/29/2020 CLINICAL DATA:  60 year old male dialysis patient with pain. Progressive left lower extremity weakness, bowel incontinence. EXAM: MRI THORACIC SPINE WITHOUT CONTRAST TECHNIQUE: Multiplanar, multisequence MR imaging of the thoracic spine was performed. No intravenous contrast was administered. COMPARISON:  Chest CT 06/29/2019. FINDINGS: Limited cervical spine imaging: Cervical disc degeneration with only borderline to mild cervical spinal stenosis suspected at C3-C4 and C6-C7 (series 18, image 9). Grossly normal cervical spinal cord. Thoracic spine segmentation:  Appears to be normal. Alignment: Stable thoracic kyphosis since last year. No spondylolisthesis. Vertebrae: Heterogeneous bone marrow signal throughout the visible spine with no marrow edema is likely the sequelae of chronic renal failure. No acute or suspicious osseous lesion identified. Cord: No spinal cord signal abnormality despite ventral cord mass effect at T6 and T7, see details below. Conus medullaris occurs below T12. Paraspinal and other soft tissues: Negative. Negative visible thoracic and upper abdominal  viscera. Disc levels: T1-T2: Negative. T2-T3: Negative. T3-T4: Mild to moderate right side facet hypertrophy and right T3 neural foraminal stenosis. T4-T5: Mild to moderate right side facet hypertrophy and right T4 foraminal stenosis. T5-T6: Mild disc bulging, endplate spurring and facet hypertrophy. No significant stenosis. T6-T7: Moderate sized right paracentral disc extrusion was partially calcified on the CT last year (series 19, image 21 today). Associated mild spinal stenosis and ventral cord mass  effect. Underlying disc bulge and endplate spurring. No foraminal stenosis. T7-T8: Mildly lobulated and cephalad disc extrusion might have increased since the CT last year, noncalcified at that time (series 19, image 22). Associated mild spinal stenosis and ventral cord mass effect. No foraminal involvement or stenosis. T8-T9: Disc bulging eccentric to the left. Mild facet hypertrophy. Effaced ventral CSF space but no significant spinal or foraminal stenosis. T9-T10: Moderate lobulated disc bulging and/or endplate spurring greater on the left. Mild posterior element hypertrophy. Mild spinal stenosis. No cord mass effect. Moderate left and mild right T9 foraminal stenosis. T10-T11: Circumferential disc bulge with a broad-based posterior component. Mild facet hypertrophy. Mild spinal stenosis without cord mass effect. Moderate bilateral T10 foraminal stenosis. T11-T12: Circumferential disc bulge eccentric to the right. Borderline to mild spinal stenosis. No cord mass effect. Moderate bilateral T11 foraminal stenosis. T12-L1: Circumferential disc bulge eccentric to the left. Borderline to mild spinal stenosis. Mild to moderate left T12 foraminal stenosis. IMPRESSION: 1. No acute osseous abnormality in the thoracic spine. Heterogeneous marrow signal likely due to chronic renal failure. 2. Moderate-sized right paracentral disc extrusion at T6-T7 is chronic and was partially calcified on a CT last year. Similar cephalad  disc extrusion at T7-T8 might have increased since last year. MIld spinal stenosis with mild cord mass effect at both levels, but no spinal cord signal abnormality. 3. Up to mild spinal stenosis related to more broad-based disc bulging T9-T10 through T12-L1. Up to moderate multilevel degenerative neural foraminal stenosis most pronounced at the right T3, right T4, left T9, bilateral T10 and T11 nerve levels. Electronically Signed   By: Genevie Ann M.D.   On: 07/29/2020 09:21   MR LUMBAR SPINE WO CONTRAST  Result Date: 07/29/2020 CLINICAL DATA:  60 year old male dialysis patient with pain. Progressive left lower extremity weakness, bowel incontinence. EXAM: MRI LUMBAR SPINE WITHOUT CONTRAST TECHNIQUE: Multiplanar, multisequence MR imaging of the lumbar spine was performed. No intravenous contrast was administered. COMPARISON:  Thoracic spine MRI today reported separately. CT Abdomen and Pelvis 07/07/2020. Previous lumbar MRI 07/06/2019. FINDINGS: Segmentation: Normal, concordant with thoracic spine numbering today and prior lumbar MRI last year. Alignment:  Preserved lumbar lordosis. Vertebrae: Heterogeneous marrow signal throughout the visible spine and pelvis compatible with chronic renal failure. No marrow edema or evidence of acute osseous abnormality. Intact visible sacrum and SI joints. Conus medullaris and cauda equina: Conus extends to the L1 level. No lower spinal cord or conus signal abnormality. Paraspinal and other soft tissues: Negative. Disc levels: Borderline to mild lower thoracic spinal stenosis through T12-L1 reported with the thoracic spine today. L1-L2:  Stable, negative. L2-L3: Stable mild disc bulging and posterior element hypertrophy. No spinal stenosis. Mild to moderate bilateral L2 foraminal stenosis appears increased since last year. L3-L4: Disc desiccation and bulky circumferential disc bulge. Mild posterior element hypertrophy. Borderline to mild spinal stenosis. No lateral recess stenosis.  Broad-based foraminal disc with moderate to severe bilateral L3 foraminal stenosis, not significantly changed. L4-L5: Disc desiccation and circumferential disc bulge with mild facet hypertrophy. No spinal or lateral recess stenosis. Mild L4 foraminal stenosis is stable. L5-S1: Mild foraminal and far lateral disc bulging with endplate spurring. Mild facet hypertrophy. No spinal or lateral recess stenosis. Mild L5 foraminal stenosis is stable. IMPRESSION: 1. Largely stable MRI appearance of the lumbar spine since last year. Chronically heterogeneous marrow signal likely related to chronic renal failure. 2. Up to mild multifactorial spinal stenosis at L3-L4, with moderate to severe bilateral L3 neural foraminal stenosis related to  disc bulging. 3. Mild to moderate bilateral L2 foraminal stenosis due to disc bulging does appear increased since last year. 4. Up to mild lower thoracic and T12-L1 spinal stenosis reported separately today on the Thoracic Spine MRI. Electronically Signed   By: Genevie Ann M.D.   On: 07/29/2020 09:27   PERIPHERAL Daniel CATHETERIZATION  Result Date: 08/15/2020 DATE OF SERVICE: 08/15/2020  PATIENT:  Leslye Peer Meriweather  60 y.o. male  PRE-OPERATIVE DIAGNOSIS:  Pulsatile left upper extremity arteriovenous fistula, recent traumatic cannulation of AVF.  POST-OPERATIVE DIAGNOSIS:  Same  PROCEDURE:  1) left upper extremity fistulagram (6m contrast) 2) left cephalic arch stenting and angioplasty (8x546mViabahn) 3) left AVF angioplasty (9x4068mthletis) 4) Conscious sedation (17 minutes)  SURGEON:  Surgeon(s) and Role:    * HawCherre RobinsD - Primary  ASSISTANT: none  ANESTHESIA:   local and IV sedation  EBL: min  BLOOD ADMINISTERED:none  DRAINS: none  LOCAL MEDICATIONS USED:  LIDOCAINE  SPECIMEN:  none  COUNTS: confirmed correct.  TOURNIQUET:  None  PATIENT DISPOSITION:  PACU - hemodynamically stable.  Delay start of Pharmacological VTE agent (>24hrs) due to surgical blood loss  or risk of bleeding: no  INDICATION FOR PROCEDURE: RicNATHANYAL ASHMEAD a 59 23o. male with pulsatile left upper extremity arteriovenous fistula and recent traumatic cannulation. After careful discussion of risks, benefits, and alternatives the patient was offered fistulagram with possible intervention. The patient understood and wished to proceed.  OPERATIVE FINDINGS: severe left cephalic arch stenosis, 50%78%solution with angioplasty, resolved with stenting. ~75% AVF stenosis, 20% residual after angioplasty. Much improved thrill in AVF on completion.  DESCRIPTION OF PROCEDURE: After identification of the patient in the pre-operative holding area, the patient was transferred to the operating room. The patient was positioned supine on the operating room table. Anesthesia was induced. The left arm was prepped and draped in standard fashion. A surgical pause was performed confirming correct patient, procedure, and operative location.  Using micropuncture technique, the left upper extremity AV fistula was accessed and a 4 French sheath placed in the fistula.  Fistulogram's were performed in stations.  This identified an AV fistula stenosis as well as the cephalic arch stenosis.  There appeared to be no stenosis in the central veins.  Initially we had great difficulty tracking a Bentson wire and at the let us [place] across the cephalic arch stenosis.  The wire was withdrawn and we reattempted to access the central veins.  Using a series of wire and catheter exchanges an 018 V18 wire was advanced into the central veins.  Over the wire angioplasty was performed of the cephalic arch using an 8 x 60 Athletics balloon.  The cephalic arch stenosis did not resolve with angioplasty.  I elected to treat this with a 8 x 50 mm Viabahn stent.  This was postdilated with a 9 x 40 mm Atlas balloon.  Resolution of the cephalic arch stenosis was noted.  The AV fistula stenosis was then treated with the same 9 x 40 mm Atlas  balloon.  The 75% stenosis did not complete resolve.  About 20% residual stenosis was noted.  Thrill was much improved in the fistula.  We ended the case here.  The wire, balloon, and sheath were withdrawn.  A figure-of-eight Monocryl suture was placed at the access site.  Hemostasis was achieved.  Conscious sedation was administered with the use of IV fentanyl and midazolam under continuous physician and nurse monitoring. Heart rate, blood pressure, and  oxygen saturation were continuously monitored. Total sedation time was 18 minutes  Upon completion of the case instrument and sharps counts were confirmed correct. The patient was transferred to the PACU in good condition. I was present for all portions of the procedure.  Yevonne Aline. Stanford Breed, MD [facility] Phone Number: [phone] 08/15/2020 12:49 PM   DG CHEST PORT 1 VIEW  Result Date: 08/12/2020 CLINICAL DATA:  Status post hemodialysis catheter insertion. EXAM: PORTABLE CHEST 1 VIEW COMPARISON:  08/04/2020 FINDINGS: Patient has RIGHT-sided internal jugular dialysis catheter, tip overlying the level of the UPPER RIGHT atrium. There is no pneumothorax following line placement. The heart is enlarged and stable in configuration.  Lungs are clear. IMPRESSION: Interval placement of RIGHT internal jugular dialysis catheter. Stable cardiomegaly. Electronically Signed   By: Nolon Nations M.D.   On: 08/12/2020 09:05   DG Ankle Left Port  Result Date: 08/12/2020 CLINICAL DATA:  Evaluate for soft tissue calcifications suggesting renal calcinosis. EXAM: PORTABLE LEFT ANKLE - 2 VIEW COMPARISON:  None. FINDINGS: Extensive Daniel atherosclerosis throughout the LEFT lower extremity. No additional soft tissue calcifications. Osseous alignment is normal. No fracture line or displaced fracture fragment. No acute-appearing cortical irregularity or osseous lesion. Fixation hardware appears appropriately positioned within the  calcaneus. IMPRESSION: 1. No acute findings. 2. Extensive Daniel calcifications (atherosclerosis) throughout this visualized portion of the LEFT lower extremity. 3. No additional soft tissue calcifications. Electronically Signed   By: Franki Cabot M.D.   On: 08/12/2020 08:04   DG Ankle Right Port  Result Date: 08/11/2020 CLINICAL DATA:  Renal disease with possible soft tissue calcifications. EXAM: PORTABLE RIGHT ANKLE - 2 VIEW COMPARISON:  None. FINDINGS: Bony structures show no acute fracture or dislocation. Diffuse Daniel calcifications are noted. No significant soft tissue calcifications are noted that are not Daniel in nature. 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 IMPRESSION: Diffuse Daniel calcifications. No other acute abnormality is noted. Electronically Signed   By: Inez Catalina M.D.   On: 08/11/2020 19:13   DG Fluoro Guide CV Line-No Report  Result Date: 08/12/2020 Fluoroscopy was utilized by the requesting physician.  No radiographic interpretation.    Microbiology: Recent Results (from the past 240 hour(s))  Resp Panel by RT-PCR (Flu A&B, Covid) Nasopharyngeal Swab     Status: None   Collection Time: 08/10/20  2:58 AM   Specimen: Nasopharyngeal Swab; Nasopharyngeal(NP) swabs in vial transport medium  Result Value Ref Range Status   SARS Coronavirus 2 by RT PCR NEGATIVE NEGATIVE Final    Comment: (NOTE) SARS-CoV-2 target nucleic acids are NOT DETECTED.  The SARS-CoV-2 RNA is generally detectable in upper respiratory specimens during the acute phase of infection. The lowest concentration of SARS-CoV-2 viral copies this assay can detect is 138 copies/mL. A negative result does not preclude SARS-Cov-2 infection and should not be used as the sole basis for treatment or other patient management decisions. A negative result may occur with  improper specimen collection/handling, submission of specimen other than nasopharyngeal swab,  presence of viral mutation(s) within the areas targeted by this assay, and inadequate number of viral copies(<138 copies/mL). A negative result must be combined with clinical observations, patient history, and epidemiological information. The expected result is Negative.  Fact Sheet for Patients:  EntrepreneurPulse.com.au  Fact Sheet for Healthcare Providers:  IncredibleEmployment.be  This test is no t yet approved or cleared by the [place] FDA and  has been authorized for detection and/or diagnosis  of SARS-CoV-2 by FDA under an Emergency Use Authorization (EUA). This EUA will remain  in effect (meaning this test can be used) for the duration of the COVID-19 declaration under Section 564(b)(1) of the Act, 21 U.S.C.section 360bbb-3(b)(1), unless the authorization is terminated  or revoked sooner.       Influenza A by PCR NEGATIVE NEGATIVE Final   Influenza B by PCR NEGATIVE NEGATIVE Final    Comment: (NOTE) The Xpert Xpress SARS-CoV-2/FLU/RSV plus assay is intended as an aid in the diagnosis of influenza from Nasopharyngeal swab specimens and should not be used as a sole basis for treatment. Nasal washings and aspirates are unacceptable for Xpert Xpress SARS-CoV-2/FLU/RSV testing.  Fact Sheet for Patients: EntrepreneurPulse.com.au  Fact Sheet for Healthcare Providers: IncredibleEmployment.be  This test is not yet approved or cleared by the [place] FDA and has been authorized for detection and/or diagnosis of SARS-CoV-2 by FDA under an Emergency Use Authorization (EUA). This EUA will remain in effect (meaning this test can be used) for the duration of the COVID-19 declaration under Section 564(b)(1) of the Act, 21 U.S.C. section 360bbb-3(b)(1), unless the authorization is terminated or revoked.  Performed at [facility], [place] [address], [place], [place] [address]      Labs: Basic  Metabolic Panel: Recent Labs  Lab 08/12/20 0021 08/13/20 0808 08/14/20 1001 08/14/20 2229 08/15/20 0544 08/16/20 0309  NA 140 137 138  --  140 138  K 4.8 3.9 4.3 3.5 3.8 3.8  CL 102 101 101  --  98 98  CO2 _0 --  23 22  GLUCOSE 104* 135* 108*  --  125* 127*  BUN 123* 68* 86*  --  37* 48*  CREATININE 11.59* 9.15* 11.26*  --  6.91* 8.81*  CALCIUM 8.2* 8.2* 7.9*  --  8.1* 8.5*  MG  --   --   --  2.0  --   --   PHOS 9.9*  --  9.2*  --   --   --    Liver Function Tests: Recent Labs  Lab 08/12/20 0021 08/14/20 1001  AST 39  --   ALT 16  --   ALKPHOS 38  --   BILITOT 0.7  --   PROT 5.9*  --   ALBUMIN 2.1* 2.1*   No results for input(s): LIPASE, AMYLASE in the last 168 hours. No results for input(s): AMMONIA in the last 168 hours. CBC: Recent Labs  Lab 08/12/20 0021 08/12/20 2152 08/13/20 0808 08/13/20 1618 08/14/20 0417 08/14/20 1549 08/14/20 2229 08/15/20 0544 08/16/20 0309  WBC 6.0  --  5.3  --  6.1  --   --  6.7 6.5  HGB 6.6*   < > 8.1*  8.1*   < > 7.6* 8.1* 7.9* 8.1* 7.8*  HCT 20.2*   < > 24.5*  25.3*   < > 23.8* 24.6* 24.3* 25.0* 24.4*  MCV 100.0  --  99.6  --  100.8*  --   --  98.4 99.2  PLT 105*  --  104*  --  100*  --   --  89* 101*   < > = values in this interval not displayed.   Cardiac Enzymes: No results for input(s): CKTOTAL, CKMB, CKMBINDEX, TROPONINI in the last 168 hours. BNP: BNP (last 3 results) No results for input(s): BNP in the last 8760 hours.  ProBNP (last 3 results) No results for input(s): PROBNP in the last 8760 hours.  CBG: Recent Labs  Lab 08/15/20 1648 08/15/20 2000 08/15/20 2049 08/16/20 0644 08/16/20 1319  GLUCAP 128* 173* 122* 97 110*       Signed:  Florencia Reasons MD, PhD, FACP  [facility] 08/16/2020, 2:57 PM   '

## 2020-08-17 ENCOUNTER — Telehealth: Payer: Self-pay

## 2020-08-17 ENCOUNTER — Telehealth: Payer: Self-pay | Admitting: Physician Assistant

## 2020-08-17 NOTE — Telephone Encounter (Signed)
Transition of care contact from inpatient facility  Date of discharge: 08/16/20 Date of contact:  08/17/20 Method: Phone Spoke to: Patient  Patient contacted to discuss transition of care from recent inpatient hospitalization. Patient was admitted to [facility] from 08/09/20-08/16/20 with discharge diagnosis of GI bleed, AVF complication, nodular rash.  Medication changes were reviewed. Pt will call back if he is missing any of his medications. Has not heard from [facility] about follow up appointment, advised to let us know if no appointment by next week and we can help follow up.   Patient will follow up with his/her outpatient HD unit on: 08/18/20  Anice Paganini, PA-C 08/17/2020, 1:01 PM  [facility]

## 2020-08-17 NOTE — Telephone Encounter (Signed)
Transition Care Management Unsuccessful Follow-up Telephone Call  Date of discharge and from where:  [facility] on 08/16/2020  Attempts:  1st Attempt  Reason for unsuccessful TCM follow-up call:  Left voice message to call back this nurse. Contact info provided.

## 2020-08-20 ENCOUNTER — Telehealth: Payer: Self-pay

## 2020-08-20 NOTE — Telephone Encounter (Signed)
Transition Care Management Follow-up Telephone Call  Date of discharge and from where: 08/16/2020, [facility]  How have you been since you were released from the hospital? He said that he feels dizzy sometimes. He explained that it can happen when he is walking and it comes and goes. informed him that his PCP would be notified if this concern.   Any questions or concerns? Yes - noted above.  Items Reviewed:  Did the pt receive and understand the discharge instructions provided? he has the instructions and did not have any questions; but he needs to review his medications.   Medications obtained and verified? he is not sure what medications he has. He said he takes 5 medications but could not confirm what he is taking.  explained to him about the importance of  having the correct medications and taking them as ordered . scheduled him with an appointment with his PCP 08/22/2020 and instructed him to bring all of his medications with him to the appointment.   Other? No   Any new allergies since your discharge? No   Do you have support at home? Yes , staying with his cousin.   Home Care and Equipment/Supplies: Were home health services ordered? no If so, what is the name of the agency? n/a  Has the agency set up a time to come to the patient's home? not applicable Were any new equipment or medical supplies ordered?  No What is the name of the medical supply agency? n/a Were you able to get the supplies/equipment? not applicable Do you have any questions related to the use of the equipment or supplies? No   He attends HD T/T/S in [facility]  Functional Questionnaire: (I = Independent and D = Dependent) ADLs: independent. He said that he manages his own medications.   Follow up appointments reviewed:   PCP Hospital f/u appt confirmed? Yes  [name], NP 08/22/2020.    [facility] f/u appt confirmed? Yes [facility] - 09/18/2020; GI 09/27/2020   Are transportation  arrangements needed? No , he stated that he drives  If their condition worsens, is the pt aware to call PCP or go to the Emergency Dept.? Yes  Was the patient provided with contact information for the PCP's office or ED? Yes, he said he has the phone number for [facility].  Was to pt encouraged to call back with questions or concerns? Yes

## 2020-08-22 ENCOUNTER — Encounter (INDEPENDENT_AMBULATORY_CARE_PROVIDER_SITE_OTHER): Payer: Self-pay | Admitting: Primary Care

## 2020-08-22 ENCOUNTER — Ambulatory Visit (INDEPENDENT_AMBULATORY_CARE_PROVIDER_SITE_OTHER): Payer: Medicare Other | Admitting: Primary Care

## 2020-08-22 ENCOUNTER — Other Ambulatory Visit: Payer: Self-pay

## 2020-08-22 VITALS — BP 167/93 | HR 95 | Temp 98.1°F | Ht 71.0 in | Wt 265.0 lb

## 2020-08-22 DIAGNOSIS — N185 Chronic kidney disease, stage 5: Secondary | ICD-10-CM

## 2020-08-22 DIAGNOSIS — Z09 Encounter for follow-up examination after completed treatment for conditions other than malignant neoplasm: Secondary | ICD-10-CM

## 2020-08-22 DIAGNOSIS — I10 Essential (primary) hypertension: Secondary | ICD-10-CM | POA: Diagnosis not present

## 2020-08-22 MED ORDER — CETIRIZINE HCL 10 MG PO TABS: 10.0000 mg | ORAL_TABLET | Freq: Every day | ORAL | 1 refills | Status: DC | PRN

## 2020-08-22 MED ORDER — CLONIDINE HCL 0.2 MG PO TABS: ORAL_TABLET | ORAL | 1 refills | Status: DC

## 2020-08-22 MED ORDER — HYDROXYZINE HCL 25 MG PO TABS: 25.0000 mg | ORAL_TABLET | Freq: Two times a day (BID) | ORAL | 1 refills | Status: DC | PRN

## 2020-08-22 NOTE — Patient Instructions (Signed)

## 2020-08-22 NOTE — Progress Notes (Signed)
Pt not taking clonidine/amlodipine states does not have Rx for them

## 2020-08-22 NOTE — Progress Notes (Signed)
Hospital follow-up  HPI  [name]  60 y.o. obese male presents for follow up from the hospital.  Patient presented to the emergency room on April 3 , 2022 forSciatica of left side and Incontinence of feces, unspecified fecal incontinence type. Than on August 04, 2020 for Bilateral foot pain, Shortness of breath, End-stage renal disease on hemodialysis ([place]), Anemia associated with chronic renal failure, Hyponatremia and Thrombocytopenia ([place]). Admit date to the hospital was 08/09/20, patient was discharged from the hospital on 08/16/20,  having black stools for the last 3 days..Stool for occult blood was positive admitted for GI bleed     Past Medical History:  Diagnosis Date  . Anemia   . Diabetes mellitus without complication ([place])    patient denies  . Dialysis patient ([place])   . End stage chronic kidney disease ([place])   . Hypertension   . ICH (intracerebral hemorrhage) ([place]) 05/20/2017  . Shoulder pain, left 06/28/2013     Allergies  Allergen Reactions  . Dilaudid [Hydromorphone Hcl] Itching and Other (See Comments)    Pt reports itchiness after IM injection       Current Outpatient Medications on File Prior to Visit  Medication Sig Dispense Refill  . calcium acetate, Phos Binder, (PHOSLYRA) 667 MG/5ML SOLN Take by mouth 3 (three) times daily with meals.    . cetirizine (ZYRTEC ALLERGY) 10 MG tablet Take 1 tablet (10 mg total) by mouth daily. (Patient taking differently: Take 10 mg by mouth daily as needed for allergies.) 90 tablet 1  . gabapentin (NEURONTIN) 100 MG capsule Take 1 capsule (100 mg total) by mouth at bedtime. 30 capsule 0  . hydrOXYzine (ATARAX/VISTARIL) 25 MG tablet Take 1 tablet (25 mg total) by mouth 2 (two) times daily as needed. (Patient taking differently: Take 25 mg by mouth 2 (two) times daily as needed for anxiety.) 120 tablet 1  . lanthanum (FOSRENOL) 1000 MG chewable tablet Chew 1 tablet (1,000 mg total) by mouth 3 (three) times daily with meals. 90 tablet  0  . oxyCODONE-acetaminophen (PERCOCET/ROXICET) 5-325 MG tablet Take 1 tablet by mouth every 6 (six) hours as needed for severe pain. 15 tablet 0  . pantoprazole (PROTONIX) 40 MG tablet Take 1 tablet (40 mg total) by mouth 2 (two) times daily. 60 tablet 0  . triamcinolone cream (KENALOG) 0.1 % Apply 1 application topically 2 (two) times daily.    [name] amLODipine (NORVASC) 10 MG tablet Take 10 mg by mouth daily. (Patient not taking: Reported on 08/22/2020)    . blood glucose meter kit and supplies KIT Dispense based on patient and insurance preference. Use up to four times daily as directed. (FOR ICD-9 250.00, 250.01). (Patient not taking: Reported on 08/22/2020) 1 each 0  . cloNIDine (CATAPRES) 0.2 MG tablet Take  1 tablet twice daily . If makes drowsy take 2 tablets - after he is off of work (Patient not taking: Reported on 08/22/2020) 180 tablet 1  . lidocaine-prilocaine (EMLA) cream Apply 1 application topically daily as needed (pain).    . [DISCONTINUED] colchicine 0.6 MG tablet Take 0.5 tablets (0.3 mg total) by mouth 2 (two) times daily. 14 tablet 0  . [DISCONTINUED] ferrous sulfate 325 (65 FE) MG tablet SMARTSIG:1 Tablet(s) By Mouth Twice Daily    . [DISCONTINUED] furosemide (LASIX) 40 MG tablet Take 1 tablet (40 mg total) by mouth daily. 30 tablet 0   No current facility-administered medications on file prior to visit.    ROS: all negative except above.  Physical Exam: Filed Weights   08/22/20 1054  Weight: 265 lb (120.2 kg)   BP (!) 167/93 (BP Location: Right Arm, Patient Position: Sitting, Cuff Size: Large)   Pulse 95   Temp 98.1 F (36.7 C) (Temporal)   Ht _0  (1.803 m)   Wt 265 lb (120.2 kg)   SpO2 95%   BMI 36.96 kg/m  General Appearance: Well nourished, in no apparent distress. Eyes: PERRLA, EOMs, conjunctiva no swelling or erythema Sinuses: No Frontal/maxillary tenderness ENT/Mouth: Ext aud canals clear, TMs without erythema, bulging. No erythema, swelling, or exudate  on post pharynx.  Tonsils not swollen or erythematous. Hearing normal.  Neck: Supple, thyroid normal.  Respiratory: Respiratory effort normal, BS equal bilaterally without rales, rhonchi, wheezing or stridor.  Cardio: RRR with no MRGs. Brisk peripheral pulses without edema.  Abdomen: Soft, + BS.  Non tender, no guarding, rebound, hernias, masses. Lymphatics: Non tender without lymphadenopathy.  Musculoskeletal: Full ROM, 5/5 strength, normal gait.  Skin: Warm, dry without rashes, lesions, ecchymosis.  Neuro: Cranial nerves intact. Normal muscle tone, no cerebellar symptoms. Sensation intact.  Psych: Awake and oriented X 3, normal affect, Insight and Judgment appropriate.   Wilver was seen today for hospitalization follow-up.  Diagnoses and all orders for this visit:  Hospital discharge follow-up -     CBC with Differential -     Basic Metabolic Panel  Chronic kidney disease, stage V (HCC) -     CBC with Differential -     Basic Metabolic Panel  Essential hypertension  Goal BP:  For patients younger than 60: Goal BP < 130/80 For patients 60 and older: Goal BP < 140/90  For patients with diabetes: Goal BP < 140/90. Bp 167/93 your medications faithfully as instructed. Maintain a healthy weight. Get at least 150 minutes of aerobic exercise per week. Minimize salt intake. Minimize alcohol intake -     cloNIDine (CATAPRES) 0.2 MG tablet; Take  1 tablet twice daily .  Other orders -     cetirizine (ZYRTEC ALLERGY) 10 MG tablet; Take 1 tablet (10 mg total) by mouth daily as needed for allergies. -     hydrOXYzine (ATARAX/VISTARIL) 25 MG tablet; Take 1 tablet (25 mg total) by mouth 2 (two) times daily as needed for anxiety.    Daniel Perna, NP 11:04 AM

## 2020-08-23 LAB — CBC WITH DIFFERENTIAL/PLATELET
Basophils Absolute: 0 10*3/uL (ref 0.0–0.2)
Basos: 0 %
EOS (ABSOLUTE): 0.3 10*3/uL (ref 0.0–0.4)
Eos: 4 %
Hematocrit: 25 % — ABNORMAL LOW (ref 37.5–51.0)
Hemoglobin: 8.4 g/dL — ABNORMAL LOW (ref 13.0–17.7)
Immature Grans (Abs): 0.1 10*3/uL (ref 0.0–0.1)
Immature Granulocytes: 1 %
Lymphocytes Absolute: 1.8 10*3/uL (ref 0.7–3.1)
Lymphs: 30 %
MCH: 31.6 pg (ref 26.6–33.0)
MCHC: 33.6 g/dL (ref 31.5–35.7)
MCV: 94 fL (ref 79–97)
Monocytes Absolute: 0.8 10*3/uL (ref 0.1–0.9)
Monocytes: 13 %
Neutrophils Absolute: 3.2 10*3/uL (ref 1.4–7.0)
Neutrophils: 52 %
Platelets: 184 10*3/uL (ref 150–450)
RBC: 2.66 x10E6/uL — CL (ref 4.14–5.80)
RDW: 16.7 % — ABNORMAL HIGH (ref 11.6–15.4)
WBC: 6.1 10*3/uL (ref 3.4–10.8)

## 2020-08-23 LAB — BASIC METABOLIC PANEL
BUN/Creatinine Ratio: 3 — ABNORMAL LOW (ref 10–24)
BUN: 20 mg/dL (ref 8–27)
CO2: 24 mmol/L (ref 20–29)
Calcium: 8.8 mg/dL (ref 8.6–10.2)
Chloride: 89 mmol/L — ABNORMAL LOW (ref 96–106)
Creatinine, Ser: 6.61 mg/dL — ABNORMAL HIGH (ref 0.76–1.27)
Glucose: 185 mg/dL — ABNORMAL HIGH (ref 65–99)
Potassium: 3.8 mmol/L (ref 3.5–5.2)
Sodium: 138 mmol/L (ref 134–144)
eGFR: 9 mL/min/{1.73_m2} — ABNORMAL LOW (ref >59)

## 2020-08-29 ENCOUNTER — Telehealth (INDEPENDENT_AMBULATORY_CARE_PROVIDER_SITE_OTHER): Payer: Self-pay

## 2020-08-29 NOTE — Telephone Encounter (Signed)
Patient verified date of birth. He is aware that hemoglobin/hematocrit are improving but to keep appointment with GI on 09/27/20. Nat Christen, CMA

## 2020-08-29 NOTE — Telephone Encounter (Signed)
-----   Message from [name], NP sent at 08/23/2020  3:35 PM EDT ----- Slight improvement in hemoglobin and hematocrit keep appt Dr. Havery Moros on 5-26 or 5-27 for F/U duodenal ulcer, GI bleed.

## 2020-08-31 ENCOUNTER — Other Ambulatory Visit: Payer: Self-pay | Admitting: *Deleted

## 2020-08-31 DIAGNOSIS — N186 End stage renal disease: Secondary | ICD-10-CM

## 2020-08-31 DIAGNOSIS — M79606 Pain in leg, unspecified: Secondary | ICD-10-CM

## 2020-09-09 ENCOUNTER — Ambulatory Visit (HOSPITAL_COMMUNITY)
Admission: EM | Admit: 2020-09-09 | Discharge: 2020-09-09 | Disposition: A | Discharge status: Home / Self Care | Payer: Medicare Other | Attending: Internal Medicine | Admitting: Internal Medicine

## 2020-09-09 ENCOUNTER — Emergency Department (HOSPITAL_COMMUNITY)
Admission: EM | Admit: 2020-09-09 | Discharge: 2020-09-09 | Disposition: A | Discharge status: Home / Self Care | Payer: Medicare Other | Attending: Emergency Medicine | Admitting: Emergency Medicine

## 2020-09-09 ENCOUNTER — Emergency Department (HOSPITAL_COMMUNITY): Payer: Medicare Other

## 2020-09-09 ENCOUNTER — Encounter (HOSPITAL_COMMUNITY): Payer: Self-pay

## 2020-09-09 ENCOUNTER — Other Ambulatory Visit: Payer: Self-pay

## 2020-09-09 DIAGNOSIS — R42 Dizziness and giddiness: Secondary | ICD-10-CM | POA: Diagnosis not present

## 2020-09-09 DIAGNOSIS — R0602 Shortness of breath: Secondary | ICD-10-CM | POA: Insufficient documentation

## 2020-09-09 DIAGNOSIS — D631 Anemia in chronic kidney disease: Secondary | ICD-10-CM | POA: Insufficient documentation

## 2020-09-09 DIAGNOSIS — Z992 Dependence on renal dialysis: Secondary | ICD-10-CM | POA: Diagnosis not present

## 2020-09-09 DIAGNOSIS — N186 End stage renal disease: Secondary | ICD-10-CM | POA: Diagnosis not present

## 2020-09-09 DIAGNOSIS — Z751 Person awaiting admission to adequate facility elsewhere: Secondary | ICD-10-CM

## 2020-09-09 DIAGNOSIS — I120 Hypertensive chronic kidney disease with stage 5 chronic kidney disease or end stage renal disease: Secondary | ICD-10-CM | POA: Insufficient documentation

## 2020-09-09 DIAGNOSIS — I951 Orthostatic hypotension: Secondary | ICD-10-CM

## 2020-09-09 DIAGNOSIS — E1122 Type 2 diabetes mellitus with diabetic chronic kidney disease: Secondary | ICD-10-CM | POA: Insufficient documentation

## 2020-09-09 DIAGNOSIS — F17210 Nicotine dependence, cigarettes, uncomplicated: Secondary | ICD-10-CM | POA: Diagnosis not present

## 2020-09-09 DIAGNOSIS — R55 Syncope and collapse: Secondary | ICD-10-CM | POA: Insufficient documentation

## 2020-09-09 LAB — BASIC METABOLIC PANEL
Anion gap: 10 (ref 5–15)
BUN: 23 mg/dL — ABNORMAL HIGH (ref 6–20)
CO2: 28 mmol/L (ref 22–32)
Calcium: 8.5 mg/dL — ABNORMAL LOW (ref 8.9–10.3)
Chloride: 98 mmol/L (ref 98–111)
Creatinine, Ser: 6.5 mg/dL — ABNORMAL HIGH (ref 0.61–1.24)
GFR, Estimated: 9 mL/min — ABNORMAL LOW (ref >60)
Glucose, Bld: 109 mg/dL — ABNORMAL HIGH (ref 70–99)
Potassium: 4.4 mmol/L (ref 3.5–5.1)
Sodium: 136 mmol/L (ref 135–145)

## 2020-09-09 LAB — CBC WITH DIFFERENTIAL/PLATELET
Abs Immature Granulocytes: 0.09 10*3/uL — ABNORMAL HIGH (ref 0.00–0.07)
Basophils Absolute: 0 10*3/uL (ref 0.0–0.1)
Basophils Relative: 1 %
Eosinophils Absolute: 0.1 10*3/uL (ref 0.0–0.5)
Eosinophils Relative: 2 %
HCT: 25.5 % — ABNORMAL LOW (ref 39.0–52.0)
Hemoglobin: 8 g/dL — ABNORMAL LOW (ref 13.0–17.0)
Immature Granulocytes: 2 %
Lymphocytes Relative: 39 %
Lymphs Abs: 2.1 10*3/uL (ref 0.7–4.0)
MCH: 32.8 pg (ref 26.0–34.0)
MCHC: 31.4 g/dL (ref 30.0–36.0)
MCV: 104.5 fL — ABNORMAL HIGH (ref 80.0–100.0)
Monocytes Absolute: 0.7 10*3/uL (ref 0.1–1.0)
Monocytes Relative: 13 %
Neutro Abs: 2.4 10*3/uL (ref 1.7–7.7)
Neutrophils Relative %: 43 %
Platelets: 132 10*3/uL — ABNORMAL LOW (ref 150–400)
RBC: 2.44 MIL/uL — ABNORMAL LOW (ref 4.22–5.81)
RDW: 20.8 % — ABNORMAL HIGH (ref 11.5–15.5)
WBC: 5.4 10*3/uL (ref 4.0–10.5)
nRBC: 4.2 % — ABNORMAL HIGH (ref 0.0–0.2)

## 2020-09-09 LAB — CBG MONITORING, ED
Glucose-Capillary: 94 mg/dL (ref 70–99)
Glucose-Capillary: 101 mg/dL — ABNORMAL HIGH (ref 70–99)

## 2020-09-09 LAB — BRAIN NATRIURETIC PEPTIDE: B Natriuretic Peptide: 516.1 pg/mL — ABNORMAL HIGH (ref 0.0–100.0)

## 2020-09-09 LAB — TROPONIN I (HIGH SENSITIVITY): Troponin I (High Sensitivity): 11 ng/L (ref <18)

## 2020-09-09 MED ORDER — SODIUM CHLORIDE 0.9 % IV BOLUS
500.0000 mL | Freq: Once | INTRAVENOUS | Status: AC
  Administered 2020-09-09: 500 mL via INTRAVENOUS

## 2020-09-09 NOTE — ED Provider Notes (Addendum)
Patient was evaluated in the triage room.  Patient reports having a syncopal episode yesterday after dialysis and feeling very dizzy today.  Denies any lightheadedness or "room spinning."  Reports feeling like he is going to "fall out" any time he stands.  Blood pressure initially 103/62 and repeat was 91/78.  Blood sugar 94 and EKG was normal sinus rhythm with prolonged QT.  Heart sounds normal with no extra sounds.  Lung sounds clear throughout all fields.  Radial pulses +2.  Patient was recently hospitalized for GI bleed.  Patient requiring evaluation at higher level of care so EMS was called to transport patient to the ED.     Pearson Forster, NP 09/09/20 1036    Pearson Forster, NP 09/09/20 1043

## 2020-09-09 NOTE — ED Triage Notes (Signed)
CBG 94 

## 2020-09-09 NOTE — ED Provider Notes (Signed)
  Physical Exam  BP (!) 142/91   Pulse 70   Temp 98.1 F (36.7 C) (Oral)   Resp 19   SpO2 98%   Physical Exam Vitals and nursing note reviewed.  Constitutional:      Appearance: Normal appearance. He is well-developed.  HENT:     Head: Normocephalic and atraumatic.  Eyes:     Extraocular Movements: Extraocular movements intact.     Conjunctiva/sclera: Conjunctivae normal.  Cardiovascular:     Rate and Rhythm: Normal rate and regular rhythm.     Heart sounds: No murmur heard.   Pulmonary:     Effort: Pulmonary effort is normal. No respiratory distress.  Abdominal:     Palpations: Abdomen is soft.     Tenderness: There is no abdominal tenderness.  Musculoskeletal:     Cervical back: Normal range of motion and neck supple.     Right lower leg: Edema present.     Left lower leg: Edema present.  Skin:    General: Skin is warm and dry.     Capillary Refill: Capillary refill takes less than 2 seconds.  Neurological:     General: No focal deficit present.     Mental Status: He is alert.     Gait: Gait normal.  Psychiatric:        Thought Content: Thought content normal.        Judgment: Judgment normal.     ED Course/Procedures   Clinical Course as of 09/09/20 1838  Sun Sep 09, 2020  1433 + orthostatics with pressure 170 lying and 107 standing. I have ordered 570ml NS bolus [AH]    Clinical Course User Index [AH] Margarita Mail, PA-C    Procedures  MDM  Pt presents with near-syncope, generalized weakness. Reports syncopal fall yesterday after HD. Per offgoing APP, pt orthostatic positive. Receiving 500cc IVF and plan is to re-evaluate. Anemic near baseline. BNP elevated compared to baseline; nonspecific in context of ESRD  On bedside evaluation, patient's history seems consistent with orthostatic lightheadedness and syncope.  Reports it happened after trying to stand up.  He feels fine now.  No recent chest pain or difficulty breathing.  No known history of CHF  most recent echo couple years ago was normal.  Given the syncope, troponin ordered.  BMP also ordered as this has not been ordered yet.  BMP and troponin are unremarkable.  On repeat evaluation after 500 cc of IV fluids the patient says he feels much better.  He said with me at bedside and ambulated down the hallway and had no recurrence of symptoms.  Given the overall benign work-up, patient is stable and appropriate for discharge.  Discussed extensively the likely diagnosis, return precautions.     Aris Lot, MD 09/09/20 Bosie Helper    Lajean Saver, MD 09/09/20 2050

## 2020-09-09 NOTE — Discharge Instructions (Addendum)
Your symptoms of feeling lightheaded and passing out whenever you stand up and walk today are most likely due to not enough fluid in your veins in your body.  Your symptoms got better whenever you received fluid to the IV because he then had enough fluid in your veins.  At dialysis, pay attention to your blood pressure and before and after dialysis and also how you feel when you stand up and try to walk before you leave.  Also pay attention to your weights before and after dialysis and how much fluid they take off.  These things all play a role in how much fluid is in your veins to help you not feel lightheaded or pass out when you stand up.  It may be that you are having too much fluid taken off at dialysis and because your blood pressure runs high all the time, a normal blood pressure may make you feel as if you have low blood pressure.  Get help right away if you: Have chest pain. Have a fast or irregular heartbeat. Develop numbness in any part of your body. Cannot move your arms or your legs. Have trouble speaking. Become sweaty or feel light-headed. Faint. Feel short of breath. Have trouble staying awake. Feel confused.

## 2020-09-09 NOTE — ED Triage Notes (Addendum)
Pt from home BID EMS for near syncope and generalized weakness. Pt seen at [facility]. Pt reports having a syncopal episode with fall yesterday after dialysis; denies hitting head or other injuries. Today pt became dizzy while getting out of car. Orthostatics positive with EMS. Pt has received all dialysis treatments this week (Tues, Thurs, Sat). Denies any pain. BGL 124.

## 2020-09-09 NOTE — ED Notes (Signed)
Ambulated pt around the room; reports no dizziness at this time and states he "feels fine, what happens now?".

## 2020-09-09 NOTE — ED Provider Notes (Addendum)
[facility] EMERGENCY DEPARTMENT Provider Note   CSN: [id number] Arrival date & time: 09/09/20  1101     History Chief Complaint  Patient presents with  . Near Syncope    Daniel [name] is a 60 y.o. male with a past medical history of diabetes, end-stage renal disease on hemodialysis Tuesday Thursday Saturday with full treatment yesterday, history of recurrent GI bleeds, previous history of ICH, history of alcohol abuse and orthostatic hypotension.  Patient states that he was fine yesterday morning, after dialysis he noticed that he was very dizzy every time he stands.  He said that yesterday he got so lightheaded that he fell to the ground.  He did not hit his head or lose consciousness.  He does not take a blood thinner.  Patient states that he is having the same issue today and that he feels like he is going to pass out every time he stands up.  He also complains of feeling short of breath but that is chronic and unchanged.  He is unsure what of his dry weight is or how much fluid they took off but he did complete an entire dialysis treatment.  He has had symptoms like this in the past.  He denies any melena, hematochezia, abdominal pain, palpitations, headache, vision changes or room spinning dizziness.  HPI     Past Medical History:  Diagnosis Date  . Anemia   . Diabetes mellitus without complication ([place])    patient denies  . Dialysis patient ([place])   . End stage chronic kidney disease ([place])   . Hypertension   . ICH (intracerebral hemorrhage) ([place]) 05/20/2017  . Shoulder pain, left 06/28/2013    Patient Active Problem List   Diagnosis Date Noted  . Acute GI bleeding 08/10/2020  . Gastrointestinal hemorrhage with melena   . Duodenal ulcer hemorrhage   . ESRD (end stage renal disease) ([place])   . AVM (arteriovenous malformation) of small bowel, acquired with hemorrhage   . Symptomatic anemia 07/31/2019  . Acute on chronic anemia 07/23/2019  . Chronic  kidney disease, stage V ([place])   . Chronic hepatitis C without hepatic coma ([place]) 07/11/2019  . Iron deficiency anemia 03/30/2019  . CKD (chronic kidney disease), stage V ([place]) 03/30/2019  . Alcohol use disorder 03/30/2019  . B12 deficiency 03/30/2019  . Orthostatic hypotension 01/22/2019  . Anemia   . Hypertension 06/29/2016  . DM2 (diabetes mellitus, type 2) ([place]) 06/29/2016    Past Surgical History:  Procedure Laterality Date  . A/V FISTULAGRAM N/A 08/15/2020   Procedure: A/V FISTULAGRAM - Left Upper;  Surgeon: Cherre Robins, MD;  Location: [facility];  Service: Cardiovascular;  Laterality: N/A;  . APPENDECTOMY    . AV FISTULA PLACEMENT Left 08/03/2019   Procedure: LEFT ARM ARTERIOVENOUS (AV) CEPHALIC  FISTULA CREATION;  Surgeon: Waynetta Sandy, MD;  Location: [facility];  Service: Vascular;  Laterality: Left;  . BIOPSY  06/30/2019   Procedure: BIOPSY;  Surgeon: Ronald Lobo, MD;  Location: [facility];  Service: Endoscopy;;  . BIOPSY  08/02/2019   Procedure: BIOPSY;  Surgeon: Yetta Flock, MD;  Location: [facility];  Service: Gastroenterology;;  . COLONOSCOPY  01/23/2012   Procedure: COLONOSCOPY;  Surgeon: Danie Binder, MD;  [facility];  Service: Endoscopy;  Laterality: N/A;  11:10 AM  . COLONOSCOPY WITH PROPOFOL N/A 06/30/2019   Procedure: COLONOSCOPY WITH PROPOFOL;  Surgeon: Ronald Lobo, MD;  Location: [facility];  Service: Endoscopy;  Laterality: N/A;  . ENTEROSCOPY N/A 08/02/2019   Procedure: ENTEROSCOPY;  Surgeon: Yetta Flock, MD;  Location: [facility] ENDOSCOPY;  Service: Gastroenterology;  Laterality: N/A;  . ESOPHAGOGASTRODUODENOSCOPY N/A 08/10/2020   Procedure: ESOPHAGOGASTRODUODENOSCOPY (EGD);  Surgeon: Jerene Bears, MD;  Location: [facility] ENDOSCOPY;  Service: Gastroenterology;  Laterality: N/A;  . ESOPHAGOGASTRODUODENOSCOPY (EGD) WITH PROPOFOL N/A 06/30/2019   Procedure: ESOPHAGOGASTRODUODENOSCOPY (EGD) WITH PROPOFOL;  Surgeon: Ronald Lobo, MD;  Location: [facility];  Service: Endoscopy;  Laterality: N/A;  . FISTULA SUPERFICIALIZATION Left 10/17/2019   Procedure: LEFT UPPER EXTREMITY FISTULA REVISION, SIDE BRANCH LIGATION,  AND SUPERFICIALIZATION;  Surgeon: Marty Heck, MD;  Location: [facility];  Service: Vascular;  Laterality: Left;  . GIVENS CAPSULE STUDY N/A 06/30/2019   Procedure: GIVENS CAPSULE STUDY;  Surgeon: Ronald Lobo, MD;  Location: [facility];  Service: Endoscopy;  Laterality: N/A;  . HEMOSTASIS CLIP PLACEMENT  08/10/2020   Procedure: HEMOSTASIS CLIP PLACEMENT;  Surgeon: Jerene Bears, MD;  Location: [facility] ENDOSCOPY;  Service: Gastroenterology;;  . HEMOSTASIS CONTROL  08/02/2019   Procedure: HEMOSTASIS CONTROL;  Surgeon: Yetta Flock, MD;  Location: [facility]OS[facility];  Service: Gastroenterology;;  . INCISION AND DRAINAGE ABSCESS N/A 06/29/2016   Procedure: INCISION AND DRAINAGE ABDOMINAL WALL ABSCESS;  Surgeon: Alphonsa Overall, MD;  Location: [facility];  Service: General;  Laterality: N/A;  . INSERTION OF DIALYSIS CATHETER Right 08/03/2019   Procedure: INSERTION OF DIALYSIS CATHETER;  Surgeon: Waynetta Sandy, MD;  Location: [facility];  Service: Vascular;  Laterality: Right;  . INSERTION OF DIALYSIS CATHETER Right 10/22/2019   Procedure: INSERTION OF 23CM TUNNELED DIALYSIS CATHETER RIGHT INTERNAL JUGULAR;  Surgeon: Angelia Mould, MD;  Location: [facility];  Service: Vascular;  Laterality: Right;  . INSERTION OF DIALYSIS CATHETER Right 08/12/2020   Procedure: INSERTION OF Right internal Jugular TUNNELED  DIALYSIS CATHETER.;  Surgeon: Waynetta Sandy, MD;  Location: [facility];  Service: Vascular;  Laterality: Right;  . Left heel surgery    . PENILE BIOPSY N/A 03/26/2020   Procedure: PENILE ULCER DEBRIDEMENT;  Surgeon: Remi Haggard, MD;  Location: [facility];  Service: Urology;  Laterality: N/A;  30 MINS       Family History  Problem Relation Age of Onset  . Colon cancer Neg Hx     Social  History   Tobacco Use  . Smoking status: Current Every Day Smoker    Packs/day: 1.00    Years: 30.00    Pack years: 30.00    Types: Cigarettes  . Smokeless tobacco: Never Used  Vaping Use  . Vaping Use: Never used  Substance Use Topics  . Alcohol use: Not Currently    Alcohol/week: 12.0 standard drinks    Types: 12 Cans of beer per week  . Drug use: Not Currently    Types: "Crack" cocaine    Comment: last in 2020    Home Medications Prior to Admission medications   Medication Sig Start Date End Date Taking? Authorizing Provider  blood glucose meter kit and supplies KIT Dispense based on patient and insurance preference. Use up to four times daily as directed. (FOR ICD-9 250.00, 250.01). 07/01/16  Yes Nita Sells, MD  cetirizine (ZYRTEC ALLERGY) 10 MG tablet Take 1 tablet (10 mg total) by mouth daily as needed for allergies. 08/22/20  Yes [name], NP  cloNIDine (CATAPRES) 0.2 MG tablet Take  1 tablet twice daily . Patient taking differently: Take 0.2 mg by mouth 2 (two) times daily. Take  1  tablet twice daily . 08/22/20  Yes [name], NP  gabapentin (NEURONTIN) 100 MG capsule Take 1 capsule (100 mg total) by mouth at bedtime. 08/16/20  Yes Florencia Reasons, MD  hydrOXYzine (ATARAX/VISTARIL) 25 MG tablet Take 1 tablet (25 mg total) by mouth 2 (two) times daily as needed for anxiety. 08/22/20  Yes [name], NP  lanthanum (FOSRENOL) 1000 MG chewable tablet Chew 1 tablet (1,000 mg total) by mouth 3 (three) times daily with meals. 08/16/20 09/15/20 Yes Florencia Reasons, MD  lidocaine-prilocaine (EMLA) cream Apply 1 application topically daily as needed (pain). 01/24/20  Yes [provider]  oxyCODONE-acetaminophen (PERCOCET/ROXICET) 5-325 MG tablet Take 1 tablet by mouth every 6 (six) hours as needed for severe pain. 04/29/73  Yes Delora Fuel, MD  triamcinolone cream (KENALOG) 0.1 % Apply 1 application topically 2 (two) times daily. 08/02/20  Yes [provider]  pantoprazole (PROTONIX) 40 MG tablet Take 1 tablet (40 mg total) by mouth 2 (two) times daily. Patient not taking: Reported on 09/09/2020 08/16/20 09/15/20  Florencia Reasons, MD  colchicine 0.6 MG tablet Take 0.5 tablets (0.3 mg total) by mouth 2 (two) times daily. 07/24/20 07/29/20  Noemi Chapel, MD  ferrous sulfate 325 (65 FE) MG tablet SMARTSIG:1 Tablet(s) By Mouth Twice Daily 08/11/19 02/09/20  [provider]  furosemide (LASIX) 40 MG tablet Take 1 tablet (40 mg total) by mouth daily. 07/25/19 02/09/20  Ladona Horns, MD    Allergies    Dilaudid [hydromorphone hcl]  Review of Systems   Review of Systems Ten systems reviewed and are negative for acute change, except as noted in the HPI.   Physical Exam Updated Vital Signs BP (!) 166/86   Pulse 60   Temp 98.1 F (36.7 C) (Oral)   Resp 17   SpO2 98%   Physical Exam Vitals and nursing note reviewed.  Constitutional:      General: He is not in acute distress.    Appearance: He is well-developed. He is not diaphoretic.  HENT:     Head: Normocephalic and atraumatic.  Eyes:     General: No scleral icterus.    Conjunctiva/sclera: Conjunctivae normal.  Cardiovascular:     Rate and Rhythm: Normal rate and regular rhythm.     Heart sounds: Normal heart sounds.     Arteriovenous access: left arteriovenous access is present. Pulmonary:     Effort: Pulmonary effort is normal. No respiratory distress.     Breath sounds: Normal breath sounds. No wheezing or rhonchi.  Abdominal:     Palpations: Abdomen is soft.     Tenderness: There is no abdominal tenderness.  Musculoskeletal:     Cervical back: Normal range of motion and neck supple.     Right lower leg: No edema.     Left lower leg: No edema.  Skin:    General: Skin is warm and dry.  Neurological:     Mental Status: He is alert.  Psychiatric:        Behavior: Behavior normal.     ED Results / Procedures / Treatments   Labs (all labs ordered are listed, but  only abnormal results are displayed) Labs Reviewed  CBC WITH DIFFERENTIAL/PLATELET - Abnormal; Notable for the following components:      Result Value   RBC 2.44 (*)    Hemoglobin 8.0 (*)    HCT 25.5 (*)    MCV 104.5 (*)    RDW 20.8 (*)    Platelets 132 (*)  nRBC 4.2 (*)    Abs Immature Granulocytes 0.09 (*)    All other components within normal limits  BRAIN NATRIURETIC PEPTIDE - Abnormal; Notable for the following components:   B Natriuretic Peptide 516.1 (*)    All other components within normal limits  CBG MONITORING, ED - Abnormal; Notable for the following components:   Glucose-Capillary 101 (*)    All other components within normal limits  PATHOLOGIST SMEAR REVIEW  BASIC METABOLIC PANEL    EKG None  Radiology DG Chest Port 1 View  Result Date: 09/09/2020 CLINICAL DATA:  Shortness of breath. Near syncope and generalized weakness. EXAM: PORTABLE CHEST 1 VIEW COMPARISON:  08/12/2020 FINDINGS: Right chest wall dual lumen catheter is noted with tips at the cavoatrial junction. Normal heart size. Aortic atherosclerotic calcification. No pleural effusion or edema. No airspace densities. The visualized osseous structures are intact. IMPRESSION: No acute cardiopulmonary abnormalities. Electronically Signed   By: Kerby Moors M.D.   On: 09/09/2020 11:53    Procedures Procedures   Medications Ordered in ED Medications  sodium chloride 0.9 % bolus 500 mL (500 mLs Intravenous New Bag/Given 09/09/20 1439)    ED Course  I have reviewed the triage vital signs and the nursing notes.  Pertinent labs & imaging results that were available during my care of the patient were reviewed by me and considered in my medical decision making (see chart for details).  Clinical Course as of 09/09/20 1530  Sun Sep 09, 2020  1433 + orthostatics with pressure 170 lying and 107 standing. I have ordered 548m NS bolus [AH]    Clinical Course User Index [AH] HMargarita Mail PA-C   MDM  Rules/Calculators/A&P                          60year old male with a past medical history of end-stage renal disease here with feelings of near syncope with standing.  He has a history of the same.  He is hypertensive with positive orthostatic vital signs and associated feelings of presyncope with standing.  His blood pressure when lying is 170 and 107 with standing.  His symptoms began after diet dialysis session yesterday.  I ordered and reviewed and interpreted labs and included CBC with baseline macrocytic anemia, CBG of 101, BMP within normal limits.  Patient did not get chemistry panel which I have ordered and will need to be reviewed.  Patient receiving half liter of fluid bolus.  EKG shows sinus rhythm at a rate of 63 with prolonged QT . plan currently is to reevaluate the patient to see if he is feeling less dizzy with standing.  If he is improved and chemistry panel is without significant abnormality feel he is safe to go home.  If not patient will need to be reevaluated. Final Clinical Impression(s) / ED Diagnoses Final diagnoses:  None    Rx / DC Orders ED Discharge Orders    None       HMargarita Mail PA-C 09/09/20 1529    HMargarita Mail PA-C 09/09/20 1530    PDavonna Belling MD 09/10/20 1454

## 2020-09-09 NOTE — ED Triage Notes (Signed)
Pt passed out at home yesterday after dialysis . Pt reports feeling dizzy today.

## 2020-09-10 LAB — PATHOLOGIST SMEAR REVIEW

## 2020-09-12 ENCOUNTER — Telehealth: Payer: Self-pay

## 2020-09-12 NOTE — Telephone Encounter (Signed)
Called and spoke to patient. He was driving and could not write down the information. Letter has been mailed to the patient with appointment information.

## 2020-09-12 NOTE — Telephone Encounter (Signed)
-----   Message from Roetta Sessions, [place] sent at 08/15/2020 12:03 PM EDT ----- Regarding: pt F/U visit scheduled Patient was admitted and had EGD for duodenal ulcer/gi bleed in April.  SA had me schedule a f/u visit in early June.  Call patient and make sure he is aware of appointment in early June with [facility]

## 2020-09-18 ENCOUNTER — Ambulatory Visit (INDEPENDENT_AMBULATORY_CARE_PROVIDER_SITE_OTHER)
Admission: RE | Admit: 2020-09-18 | Discharge: 2020-09-18 | Disposition: A | Discharge status: Home / Self Care | Payer: Medicare Other | Source: Ambulatory Visit | Attending: Vascular Surgery | Admitting: Vascular Surgery

## 2020-09-18 ENCOUNTER — Other Ambulatory Visit: Payer: Self-pay

## 2020-09-18 ENCOUNTER — Ambulatory Visit (HOSPITAL_COMMUNITY)
Admission: RE | Admit: 2020-09-18 | Discharge: 2020-09-18 | Disposition: A | Discharge status: Home / Self Care | Payer: Medicare Other | Source: Ambulatory Visit | Attending: Vascular Surgery | Admitting: Vascular Surgery

## 2020-09-18 ENCOUNTER — Ambulatory Visit (INDEPENDENT_AMBULATORY_CARE_PROVIDER_SITE_OTHER): Payer: Medicare Other | Admitting: Physician Assistant

## 2020-09-18 ENCOUNTER — Encounter (HOSPITAL_COMMUNITY): Payer: Medicare Other

## 2020-09-18 VITALS — BP 150/85 | HR 70 | Temp 98.7°F | Resp 20 | Ht 71.0 in | Wt 270.8 lb

## 2020-09-18 DIAGNOSIS — M79606 Pain in leg, unspecified: Secondary | ICD-10-CM | POA: Insufficient documentation

## 2020-09-18 DIAGNOSIS — I739 Peripheral vascular disease, unspecified: Secondary | ICD-10-CM

## 2020-09-18 DIAGNOSIS — M7989 Other specified soft tissue disorders: Secondary | ICD-10-CM

## 2020-09-18 DIAGNOSIS — N186 End stage renal disease: Secondary | ICD-10-CM | POA: Insufficient documentation

## 2020-09-18 NOTE — Progress Notes (Signed)
Office Note     CC:  follow up Requesting Provider:  [name], NP  HPI: Daniel [name] is a 60 y.o. (Aug 24, 1960) male who presents status post left cephalic arch stenting and angioplasty by Dr. Stanford Breed on 08/15/2020 due to trouble with cannulation of fistula.  He is currently dialyzing via right IJ TDC due to recent infiltration.  He is anxious to get the catheter out of his jugular vein.  He is also complaining of bilateral foot pain with burning sensation.  He denies any wounds on his feet.  He also denies any rest pain or traditional claudication.  He has noticed small painful eschar wounds popping up on his arms and legs.  There is mention of possible calciphylaxis in his chart however he is unsure of what this is or if he has been diagnosed.   Past Medical History:  Diagnosis Date  . Anemia   . Diabetes mellitus without complication ([place])    patient denies  . Dialysis patient ([place])   . End stage chronic kidney disease ([place])   . Hypertension   . ICH (intracerebral hemorrhage) ([place]) 05/20/2017  . Shoulder pain, left 06/28/2013    Past Surgical History:  Procedure Laterality Date  . A/V FISTULAGRAM N/A 08/15/2020   Procedure: A/V FISTULAGRAM - Left Upper;  Surgeon: Cherre Robins, MD;  Location: [place] [facility];  Service: Cardiovascular;  Laterality: N/A;  . APPENDECTOMY    . AV FISTULA PLACEMENT Left 08/03/2019   Procedure: LEFT ARM ARTERIOVENOUS (AV) CEPHALIC  FISTULA CREATION;  Surgeon: Waynetta Sandy, MD;  Location: [place];  Service: Vascular;  Laterality: Left;  . BIOPSY  06/30/2019   Procedure: BIOPSY;  Surgeon: Ronald Lobo, MD;  Location: [place];  Service: Endoscopy;;  . BIOPSY  08/02/2019   Procedure: BIOPSY;  Surgeon: Yetta Flock, MD;  Location: [place];  Service: Gastroenterology;;  . COLONOSCOPY  01/23/2012   Procedure: COLONOSCOPY;  Surgeon: Danie Binder, MD;  [facility];  Service: Endoscopy;  Laterality: N/A;   11:10 AM  . COLONOSCOPY WITH PROPOFOL N/A 06/30/2019   Procedure: COLONOSCOPY WITH PROPOFOL;  Surgeon: Ronald Lobo, MD;  Location: [place];  Service: Endoscopy;  Laterality: N/A;  . ENTEROSCOPY N/A 08/02/2019   Procedure: ENTEROSCOPY;  Surgeon: Yetta Flock, MD;  Location: [facility] ENDOSCOPY;  Service: Gastroenterology;  Laterality: N/A;  . ESOPHAGOGASTRODUODENOSCOPY N/A 08/10/2020   Procedure: ESOPHAGOGASTRODUODENOSCOPY (EGD);  Surgeon: Jerene Bears, MD;  Location: [facility] ENDOSCOPY;  Service: Gastroenterology;  Laterality: N/A;  . ESOPHAGOGASTRODUODENOSCOPY (EGD) WITH PROPOFOL N/A 06/30/2019   Procedure: ESOPHAGOGASTRODUODENOSCOPY (EGD) WITH PROPOFOL;  Surgeon: Ronald Lobo, MD;  Location: [place];  Service: Endoscopy;  Laterality: N/A;  . FISTULA SUPERFICIALIZATION Left 10/17/2019   Procedure: LEFT UPPER EXTREMITY FISTULA REVISION, SIDE BRANCH LIGATION,  AND SUPERFICIALIZATION;  Surgeon: Marty Heck, MD;  Location: [place];  Service: Vascular;  Laterality: Left;  . GIVENS CAPSULE STUDY N/A 06/30/2019   Procedure: GIVENS CAPSULE STUDY;  Surgeon: Ronald Lobo, MD;  Location: [facility] ENDOSCOPY;  Service: Endoscopy;  Laterality: N/A;  . HEMOSTASIS CLIP PLACEMENT  08/10/2020   Procedure: HEMOSTASIS CLIP PLACEMENT;  Surgeon: Jerene Bears, MD;  Location: [place];  Service: Gastroenterology;;  . HEMOSTASIS CONTROL  08/02/2019   Procedure: HEMOSTASIS CONTROL;  Surgeon: Yetta Flock, MD;  Location: [place];  Service: Gastroenterology;;  . INCISION AND DRAINAGE ABSCESS N/A 06/29/2016   Procedure: INCISION AND DRAINAGE ABDOMINAL WALL ABSCESS;  Surgeon: Alphonsa Overall, MD;  [facility];  Service: General;  Laterality: N/A;  . INSERTION OF DIALYSIS CATHETER Right 08/03/2019   Procedure: INSERTION OF DIALYSIS CATHETER;  Surgeon: Waynetta Sandy, MD;  Location: [place];  Service: Vascular;  Laterality: Right;  . INSERTION OF DIALYSIS CATHETER Right 10/22/2019   Procedure:  INSERTION OF 23CM TUNNELED DIALYSIS CATHETER RIGHT INTERNAL JUGULAR;  Surgeon: Angelia Mould, MD;  Location: [place];  Service: Vascular;  Laterality: Right;  . INSERTION OF DIALYSIS CATHETER Right 08/12/2020   Procedure: INSERTION OF Right internal Jugular TUNNELED  DIALYSIS CATHETER.;  Surgeon: Waynetta Sandy, MD;  Location: [place];  Service: Vascular;  Laterality: Right;  . Left heel surgery    . PENILE BIOPSY N/A 03/26/2020   Procedure: PENILE ULCER DEBRIDEMENT;  Surgeon: Remi Haggard, MD;  [facility];  Service: Urology;  Laterality: N/A;  30 MINS    Social History   Socioeconomic History  . Marital status: Single    Spouse name: Not on file  . Number of children: Not on file  . Years of education: Not on file  . Highest education level: Not on file  Occupational History  . Not on file  Tobacco Use  . Smoking status: Current Every Day Smoker    Packs/day: 1.00    Years: 30.00    Pack years: 30.00    Types: Cigarettes  . Smokeless tobacco: Never Used  Vaping Use  . Vaping Use: Never used  Substance and Sexual Activity  . Alcohol use: Not Currently    Alcohol/week: 12.0 standard drinks    Types: 12 Cans of beer per week  . Drug use: Not Currently    Types: "Crack" cocaine    Comment: last in 2020  . Sexual activity: Yes    Birth control/protection: None  Other Topics Concern  . Not on file  Social History Narrative  . Not on file   Social Determinants of Health   Financial Resource Strain: Not on file  Food Insecurity: Not on file  Transportation Needs: Not on file  Physical Activity: Not on file  Stress: Not on file  Social Connections: Not on file  Intimate Partner Violence: Not on file    Family History  Problem Relation Age of Onset  . Colon cancer Neg Hx     Current Outpatient Medications  Medication Sig Dispense Refill  . blood glucose meter kit and supplies KIT Dispense based on patient and insurance preference. Use up to  four times daily as directed. (FOR ICD-9 250.00, 250.01). 1 each 0  . Calcium Acetate 667 MG TABS Take 2 tablets by mouth 4 (four) times daily.    . cetirizine (ZYRTEC ALLERGY) 10 MG tablet Take 1 tablet (10 mg total) by mouth daily as needed for allergies. 90 tablet 1  . cloNIDine (CATAPRES) 0.2 MG tablet Take  1 tablet twice daily . (Patient taking differently: Take 0.2 mg by mouth 2 (two) times daily. Take  1 tablet twice daily .) 180 tablet 1  . gabapentin (NEURONTIN) 100 MG capsule Take 1 capsule (100 mg total) by mouth at bedtime. 30 capsule 0  . hydrOXYzine (ATARAX/VISTARIL) 25 MG tablet Take 1 tablet (25 mg total) by mouth 2 (two) times daily as needed for anxiety. 60 tablet 1  . lidocaine-prilocaine (EMLA) cream Apply 1 application topically daily as needed (pain).    [name] oxyCODONE-acetaminophen (PERCOCET/ROXICET) 5-325 MG tablet Take 1 tablet by mouth every 6 (six) hours as needed for severe pain. 15  tablet 0  . triamcinolone cream (KENALOG) 0.1 % Apply 1 application topically 2 (two) times daily.    . pantoprazole (PROTONIX) 40 MG tablet Take 1 tablet (40 mg total) by mouth 2 (two) times daily. (Patient not taking: Reported on 09/09/2020) 60 tablet 0   No current facility-administered medications for this visit.    Allergies  Allergen Reactions  . Dilaudid [Hydromorphone Hcl] Itching and Other (See Comments)    Pt reports itchiness after IM injection      REVIEW OF SYSTEMS:   [X] denotes positive finding, [ ] denotes negative finding Cardiac  Comments:  Chest pain or chest pressure:    Shortness of breath upon exertion:    Short of breath when lying flat:    Irregular heart rhythm:        Vascular    Pain in calf, thigh, or hip brought on by ambulation:    Pain in feet at night that wakes you up from your sleep:     Blood clot in your veins:    Leg swelling:         Pulmonary    Oxygen at home:    Productive cough:     Wheezing:         Neurologic    Sudden weakness  in arms or legs:     Sudden numbness in arms or legs:     Sudden onset of difficulty speaking or slurred speech:    Temporary loss of vision in one eye:     Problems with dizziness:         Gastrointestinal    Blood in stool:     Vomited blood:         Genitourinary    Burning when urinating:     Blood in urine:        Psychiatric    Major depression:         Hematologic    Bleeding problems:    Problems with blood clotting too easily:        Skin    Rashes or ulcers:        Constitutional    Fever or chills:      PHYSICAL EXAMINATION:  Vitals:   09/18/20 1424  BP: (!) 150/85  Pulse: 70  Resp: 20  Temp: 98.7 F (37.1 C)  TempSrc: Temporal  SpO2: 95%  Weight: 270 lb 12.8 oz (122.8 kg)  Height: 5' 11" (1.803 m)    General:  WDWN in NAD; vital signs documented above Gait: Not observed HENT: WNL, normocephalic Pulmonary: normal non-labored breathing , without Rales, rhonchi,  wheezing Cardiac: regular HR Abdomen: soft, NT, no masses Skin: with rash involving arms and legs with lesions in various stages of healing; no open sores Extremities: Palpable thrill left arm AV fistula; feet are warm to touch with brisk PT and AT signals by Doppler; no open wounds on the feet Musculoskeletal: no muscle wasting or atrophy  Neurologic: A&O X 3;  No focal weakness or paresthesias are detected Psychiatric:  The pt has Normal affect.   Non-Invasive Vascular Imaging:   Patent left brachiocephalic fistula with patent cephalic arch stent   ABIs were unable to be performed due to incompressible tibial vessels however there are triphasic waveforms in the posterior tibial arteries at the level of the ankle with great toe pressures of 0.47 on the right and 0.43 on the left   ASSESSMENT/PLAN:: 60 y.o. male here status post left arm fistulogram with cephalic  arch stenting  -Hematoma has resolved from prior infiltration event; based on physical exam and dialysis access duplex it  is okay to transition back to left arm AV fistula from right IJ TDC.  TDC can be removed when nephrology is comfortable with the performance of the fistula -With complaints of burning in his feet at rest and with activity I will ask his primary care physician if she can increase the dose of his Neurontin -Despite incompressible tibial vessels patient does have triphasic waveforms at the level of the ankle bilaterally; I think it would be okay if he manages his symptoms of edema of bilateral lower extremities with light knee-high compression.  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  Currently there are no open sores requiring intensive wound care.  I would not repeat ABIs given that he has incompressible tibial vessels however if he develops wounds on his feet in the future he will need to be considered for angiography   Dagoberto Ligas, PA-C [facility] [phone]  Clinic MD:   Carlis Abbott

## 2020-09-26 ENCOUNTER — Other Ambulatory Visit: Payer: Self-pay

## 2020-09-26 ENCOUNTER — Ambulatory Visit (INDEPENDENT_AMBULATORY_CARE_PROVIDER_SITE_OTHER): Payer: Medicare Other | Admitting: Primary Care

## 2020-09-26 ENCOUNTER — Encounter (INDEPENDENT_AMBULATORY_CARE_PROVIDER_SITE_OTHER): Payer: Self-pay | Admitting: Primary Care

## 2020-09-26 VITALS — BP 158/95 | HR 74 | Temp 98.1°F | Ht 71.0 in | Wt 274.2 lb

## 2020-09-26 DIAGNOSIS — Z720 Tobacco use: Secondary | ICD-10-CM | POA: Diagnosis not present

## 2020-09-26 DIAGNOSIS — I10 Essential (primary) hypertension: Secondary | ICD-10-CM | POA: Diagnosis not present

## 2020-09-26 DIAGNOSIS — E119 Type 2 diabetes mellitus without complications: Secondary | ICD-10-CM | POA: Diagnosis not present

## 2020-09-26 DIAGNOSIS — L853 Xerosis cutis: Secondary | ICD-10-CM

## 2020-09-26 DIAGNOSIS — N186 End stage renal disease: Secondary | ICD-10-CM | POA: Diagnosis not present

## 2020-09-26 LAB — POCT GLYCOSYLATED HEMOGLOBIN (HGB A1C): Hemoglobin A1C: 5.4 % (ref 4.0–5.6)

## 2020-09-26 MED ORDER — PANTOPRAZOLE SODIUM 40 MG PO TBEC: 40.0000 mg | DELAYED_RELEASE_TABLET | Freq: Two times a day (BID) | ORAL | 0 refills | Status: DC

## 2020-09-26 MED ORDER — CALAMINE EX LOTN: 1.0000 "application " | TOPICAL_LOTION | CUTANEOUS | 0 refills | Status: DC | PRN

## 2020-09-26 MED ORDER — NICOTINE 7 MG/24HR TD PT24: 7.0000 mg | MEDICATED_PATCH | Freq: Every day | TRANSDERMAL | 0 refills | Status: DC

## 2020-09-26 MED ORDER — CLONIDINE HCL 0.2 MG PO TABS: ORAL_TABLET | ORAL | 1 refills | Status: DC

## 2020-09-26 MED ORDER — HYDROXYZINE HCL 25 MG PO TABS: 25.0000 mg | ORAL_TABLET | Freq: Three times a day (TID) | ORAL | 1 refills | Status: DC | PRN

## 2020-09-26 MED ORDER — NICOTINE 21 MG/24HR TD PT24: 21.0000 mg | MEDICATED_PATCH | Freq: Every day | TRANSDERMAL | 0 refills | Status: DC

## 2020-09-26 MED ORDER — NICOTINE 14 MG/24HR TD PT24
14.0000 mg | MEDICATED_PATCH | Freq: Every day | TRANSDERMAL | 0 refills | Status: DC
Start: 2020-09-26 — End: 2020-10-28

## 2020-09-26 NOTE — Progress Notes (Signed)
Established Patient Office Visit  Subjective:  Patient ID: Daniel [name], male    DOB: 1960/09/06  Age: 60 y.o. MRN: [id number]  CC:  Chief Complaint  Patient presents with  . Blood Pressure Check    HPI Daniel [name] is a 60 year old male obese male presents for management of blood pressure.  Blood pressure is elevated today not at goal.  He has not taken his blood pressure medication before visit.  Explained to patient again I am unable to determine if medication is affective if he does not take it. Denies shortness of breath, headaches, chest pain or lower extremity edema, sudden onset, vision changes, unilateral weakness, dizziness, paresthesias.  He continues to complain of pruritus and discoloration with lesions and explained this is a side effect of hemodialysis.  Today I will place on his AVS causes and reasons for the skin problem he is having to give him more clarity. Past Medical History:  Diagnosis Date  . Anemia   . Diabetes mellitus without complication ([place])    patient denies  . Dialysis patient ([place])   . End stage chronic kidney disease ([place])   . Hypertension   . ICH (intracerebral hemorrhage) ([place]) 05/20/2017  . Shoulder pain, left 06/28/2013    Past Surgical History:  Procedure Laterality Date  . A/V FISTULAGRAM N/A 08/15/2020   Procedure: A/V FISTULAGRAM - Left Upper;  Surgeon: Cherre Robins, MD;  Location: [place] [facility];  Service: Cardiovascular;  Laterality: N/A;  . APPENDECTOMY    . AV FISTULA PLACEMENT Left 08/03/2019   Procedure: LEFT ARM ARTERIOVENOUS (AV) CEPHALIC  FISTULA CREATION;  Surgeon: Waynetta Sandy, MD;  Location: [place];  Service: Vascular;  Laterality: Left;  . BIOPSY  06/30/2019   Procedure: BIOPSY;  Surgeon: Ronald Lobo, MD;  Location: [place];  Service: Endoscopy;;  . BIOPSY  08/02/2019   Procedure: BIOPSY;  Surgeon: Yetta Flock, MD;  Location: [place];  Service: Gastroenterology;;  . COLONOSCOPY   01/23/2012   Procedure: COLONOSCOPY;  Surgeon: Danie Binder, MD;  [facility];  Service: Endoscopy;  Laterality: N/A;  11:10 AM  . COLONOSCOPY WITH PROPOFOL N/A 06/30/2019   Procedure: COLONOSCOPY WITH PROPOFOL;  Surgeon: Ronald Lobo, MD;  Location: [place];  Service: Endoscopy;  Laterality: N/A;  . ENTEROSCOPY N/A 08/02/2019   Procedure: ENTEROSCOPY;  Surgeon: Yetta Flock, MD;  Location: [facility] ENDOSCOPY;  Service: Gastroenterology;  Laterality: N/A;  . ESOPHAGOGASTRODUODENOSCOPY N/A 08/10/2020   Procedure: ESOPHAGOGASTRODUODENOSCOPY (EGD);  Surgeon: Jerene Bears, MD;  Location: [facility] ENDOSCOPY;  Service: Gastroenterology;  Laterality: N/A;  . ESOPHAGOGASTRODUODENOSCOPY (EGD) WITH PROPOFOL N/A 06/30/2019   Procedure: ESOPHAGOGASTRODUODENOSCOPY (EGD) WITH PROPOFOL;  Surgeon: Ronald Lobo, MD;  Location: [place];  Service: Endoscopy;  Laterality: N/A;  . FISTULA SUPERFICIALIZATION Left 10/17/2019   Procedure: LEFT UPPER EXTREMITY FISTULA REVISION, SIDE BRANCH LIGATION,  AND SUPERFICIALIZATION;  Surgeon: Marty Heck, MD;  Location: [place];  Service: Vascular;  Laterality: Left;  . GIVENS CAPSULE STUDY N/A 06/30/2019   Procedure: GIVENS CAPSULE STUDY;  Surgeon: Ronald Lobo, MD;  Location: [facility] ENDOSCOPY;  Service: Endoscopy;  Laterality: N/A;  . HEMOSTASIS CLIP PLACEMENT  08/10/2020   Procedure: HEMOSTASIS CLIP PLACEMENT;  Surgeon: Jerene Bears, MD;  Location: [place];  Service: Gastroenterology;;  . HEMOSTASIS CONTROL  08/02/2019   Procedure: HEMOSTASIS CONTROL;  Surgeon: Yetta Flock, MD;  Location: [place];  Service: Gastroenterology;;  . INCISION AND DRAINAGE ABSCESS N/A 06/29/2016  Procedure: INCISION AND DRAINAGE ABDOMINAL WALL ABSCESS;  Surgeon: Alphonsa Overall, MD;  [facility];  Service: General;  Laterality: N/A;  . INSERTION OF DIALYSIS CATHETER Right 08/03/2019   Procedure: INSERTION OF DIALYSIS CATHETER;  Surgeon: Waynetta Sandy, MD;  Location: [place];  Service: Vascular;  Laterality: Right;  . INSERTION OF DIALYSIS CATHETER Right 10/22/2019   Procedure: INSERTION OF 23CM TUNNELED DIALYSIS CATHETER RIGHT INTERNAL JUGULAR;  Surgeon: Angelia Mould, MD;  Location: [place];  Service: Vascular;  Laterality: Right;  . INSERTION OF DIALYSIS CATHETER Right 08/12/2020   Procedure: INSERTION OF Right internal Jugular TUNNELED  DIALYSIS CATHETER.;  Surgeon: Waynetta Sandy, MD;  Location: [place];  Service: Vascular;  Laterality: Right;  . Left heel surgery    . PENILE BIOPSY N/A 03/26/2020   Procedure: PENILE ULCER DEBRIDEMENT;  Surgeon: Remi Haggard, MD;  [facility];  Service: Urology;  Laterality: N/A;  30 MINS    Family History  Problem Relation Age of Onset  . Colon cancer Neg Hx     Social History   Socioeconomic History  . Marital status: Single    Spouse name: Not on file  . Number of children: Not on file  . Years of education: Not on file  . Highest education level: Not on file  Occupational History  . Not on file  Tobacco Use  . Smoking status: Current Every Day Smoker    Packs/day: 1.00    Years: 30.00    Pack years: 30.00    Types: Cigarettes  . Smokeless tobacco: Never Used  Vaping Use  . Vaping Use: Never used  Substance and Sexual Activity  . Alcohol use: Not Currently    Alcohol/week: 12.0 standard drinks    Types: 12 Cans of beer per week  . Drug use: Not Currently    Types: "Crack" cocaine    Comment: last in 2020  . Sexual activity: Yes    Birth control/protection: None  Other Topics Concern  . Not on file  Social History Narrative  . Not on file   Social Determinants of Health   Financial Resource Strain: Not on file  Food Insecurity: Not on file  Transportation Needs: Not on file  Physical Activity: Not on file  Stress: Not on file  Social Connections: Not on file  Intimate Partner Violence: Not on file    Outpatient Medications Prior to  Visit  Medication Sig Dispense Refill  . Calcium Acetate 667 MG TABS Take 2 tablets by mouth 4 (four) times daily.    . cetirizine (ZYRTEC ALLERGY) 10 MG tablet Take 1 tablet (10 mg total) by mouth daily as needed for allergies. 90 tablet 1  . gabapentin (NEURONTIN) 100 MG capsule Take 1 capsule (100 mg total) by mouth at bedtime. 30 capsule 0  . oxyCODONE-acetaminophen (PERCOCET/ROXICET) 5-325 MG tablet Take 1 tablet by mouth every 6 (six) hours as needed for severe pain. 15 tablet 0  . cloNIDine (CATAPRES) 0.2 MG tablet Take  1 tablet twice daily . (Patient taking differently: Take 0.2 mg by mouth 2 (two) times daily. Take  1 tablet twice daily .) 180 tablet 1  . hydrOXYzine (ATARAX/VISTARIL) 25 MG tablet Take 1 tablet (25 mg total) by mouth 2 (two) times daily as needed for anxiety. 60 tablet 1  . blood glucose meter kit and supplies KIT Dispense based on patient and insurance preference. Use up to four times daily as directed. (FOR ICD-9 250.00, 250.01). (Patient  not taking: Reported on 09/26/2020) 1 each 0  . triamcinolone cream (KENALOG) 0.1 % Apply 1 application topically 2 (two) times daily.    . lidocaine-prilocaine (EMLA) cream Apply 1 application topically daily as needed (pain).    . pantoprazole (PROTONIX) 40 MG tablet Take 1 tablet (40 mg total) by mouth 2 (two) times daily. (Patient not taking: Reported on 09/09/2020) 60 tablet 0   No facility-administered medications prior to visit.    Allergies  Allergen Reactions  . Dilaudid [Hydromorphone Hcl] Itching and Other (See Comments)    Pt reports itchiness after IM injection     ROS Review of Systems  Respiratory: Positive for shortness of breath.        States from smoking.  Skin: Positive for color change and rash.  All other systems reviewed and are negative.     Objective:    Physical Exam Vitals reviewed.  Constitutional:      Appearance: He is obese.  HENT:     Head: Normocephalic.     Right Ear: External ear  normal.     Left Ear: External ear normal.     Nose: Nose normal.  Eyes:     Extraocular Movements: Extraocular movements intact.  Cardiovascular:     Rate and Rhythm: Normal rate and regular rhythm.  Pulmonary:     Effort: Pulmonary effort is normal.     Breath sounds: Normal breath sounds.  Abdominal:     General: Bowel sounds are normal.     Palpations: Abdomen is soft.  Musculoskeletal:        General: Normal range of motion.     Cervical back: Normal range of motion and neck supple.  Skin:    Findings: Lesion and rash present.  Neurological:     Mental Status: He is alert and oriented to person, place, and time.  Psychiatric:        Mood and Affect: Mood normal.        Behavior: Behavior normal.        Thought Content: Thought content normal.        Judgment: Judgment normal.     BP (!) 158/95 (BP Location: Right Arm, Patient Position: Sitting, Cuff Size: Large)   Pulse 74   Temp 98.1 F (36.7 C) (Temporal)   Ht 5' 11" (1.803 m)   Wt 274 lb 3.2 oz (124.4 kg)   SpO2 95%   BMI 38.24 kg/m  Wt Readings from Last 3 Encounters:  09/26/20 274 lb 3.2 oz (124.4 kg)  09/18/20 270 lb 12.8 oz (122.8 kg)  08/22/20 265 lb (120.2 kg)     Health Maintenance Due  Topic Date Due  . FOOT EXAM  Never done  . OPHTHALMOLOGY EXAM  Never done  . Zoster Vaccines- Shingrix (1 of 2) Never done  . COVID-19 Vaccine (2 - Booster for Janssen series) 10/24/2019    There are no preventive care reminders to display for this patient.  Lab Results  Component Value Date   TSH 2.833 06/29/2016   Lab Results  Component Value Date   WBC 5.4 09/09/2020   HGB 8.0 (L) 09/09/2020   HCT 25.5 (L) 09/09/2020   MCV 104.5 (H) 09/09/2020   PLT 132 (L) 09/09/2020   Lab Results  Component Value Date   NA 136 09/09/2020   K 4.4 09/09/2020   CO2 28 09/09/2020   GLUCOSE 109 (H) 09/09/2020   BUN 23 (H) 09/09/2020   CREATININE 6.50 (H) 09/09/2020     BILITOT 0.7 08/12/2020   ALKPHOS 38  08/12/2020   AST 39 08/12/2020   ALT 16 08/12/2020   PROT 5.9 (L) 08/12/2020   ALBUMIN 2.1 (L) 08/14/2020   CALCIUM 8.5 (L) 09/09/2020   ANIONGAP 10 09/09/2020   EGFR 9 (L) 08/22/2020   Lab Results  Component Value Date   CHOL 103 12/28/2019   Lab Results  Component Value Date   HDL 35 (L) 12/28/2019   Lab Results  Component Value Date   LDLCALC 32 12/28/2019   Lab Results  Component Value Date   TRIG 235 (H) 12/28/2019   Lab Results  Component Value Date   CHOLHDL 2.9 12/28/2019   Lab Results  Component Value Date   HGBA1C 5.4 09/26/2020      Assessment & Plan:  [place] was seen today for blood pressure check.  Diagnoses and all orders for this visit:  Type 2 diabetes mellitus without complication, without long-term current use of insulin (HCC) -     HgB A1c 5.4 per ADA guidelines patient is not a diabetic not on medication and A1C < 5.7. Continue to monitor foods that are high in carbohydrates are the following rice, potatoes, breads, sugars, and pastas.  Reduction in the intake (eating) will assist in lowering your blood sugars.  Tobacco abuse Candid conversation you are going to HD for damage kidneys that you dread going but you are continuing to smoke to damage his lungs its time to make a choice. Increased risk for lung cancer and other respiratory diseases recommend cessation. Today he agreed to try Nicoderm patches. Very pleased  Essential hypertension Elevated Bp - Counseled on blood pressure goal of less than 130/80, low-sodium, DASH diet, medication compliance, 150 minutes of moderate intensity exercise per week. Discussed medication compliance, adverse effects. Did not take Bp medication this morning.  ESRD (end stage renal disease) (HCC) Followed by nephrology   Xerosis of skin Spoke with a nephrologist regarding his constant pruritus , sores and dry skin tried medication no help.  Told a new drug has been approved and will be available in the next  couple of weeks and can be administered during dialysis    Meds ordered this encounter  Medications  . nicotine (NICODERM CQ) 21 mg/24hr patch    Sig: Place 1 patch (21 mg total) onto the skin daily.    Dispense:  28 patch    Refill:  0  . nicotine (NICODERM CQ) 14 mg/24hr patch    Sig: Place 1 patch (14 mg total) onto the skin daily.    Dispense:  28 patch    Refill:  0  . nicotine (NICODERM CQ) 7 mg/24hr patch    Sig: Place 1 patch (7 mg total) onto the skin daily.    Dispense:  28 patch    Refill:  0  . hydrOXYzine (ATARAX/VISTARIL) 25 MG tablet    Sig: Take 1 tablet (25 mg total) by mouth every 8 (eight) hours as needed for anxiety or itching.    Dispense:  90 tablet    Refill:  1  . calamine lotion    Sig: Apply 1 application topically as needed for itching.    Dispense:  120 mL    Refill:  0  . cloNIDine (CATAPRES) 0.2 MG tablet    Sig: Take  1 tablet twice daily .    Dispense:  180 tablet    Refill:  1  . pantoprazole (PROTONIX) 40 MG tablet    Sig: Take 1   tablet (40 mg total) by mouth 2 (two) times daily.    Dispense:  60 tablet    Refill:  0    Follow-up: Return in about 4 weeks (around 10/24/2020) for BP ck only.    [name], NP

## 2020-09-26 NOTE — Patient Instructions (Signed)
Some patients with chronic kidney disease (CKD) who are on dialysis may notice some unpleasant changes in their skin. Three skin conditions that sometimes affect those on dialysis include itching (pruritus), dry skin (xerosis) and skin discoloration (hyperpigmentation). Learning why these skin conditions happen, and what can be done to prevent or ease the problem, can help keep skin as healthy as possible.  Itching (pruritus) A majority of dialysis patients, whether they do hemodialysis or peritoneal dialysis (PD), may experience itching at some point. Some feel itchy all the time, while for others it comes and goes. Many say itching is worse during or just after treatment. For some people the itching is in one area, while others feel itchy all over.  A common cause of itching is a high level of phosphorus in the body. Because dialysis doesn't effectively remove phosphorus, a renal diet that limits foods high in phosphorous is prescribed. Additionally, taking phosphorus binders with every meal and snacks can help. Try to maintain a phosphorus level at 5.5 or less. Staying on dialysis for your full treatment time is also recommended, because it can remove some phosphorus as well as other wastes and toxins.  Allergies can cause itching. If you notice itching occurs at the beginning of dialysis treatments, you could have an allergy to the blood tubing, dialyzer (artificial kidney), the type of heparin being used or other elements associated with the treatment.  Antihistamines, such as Benadryl, are used to treat allergies and have helped to relieve itching. Creams that contain capsaicin, witch hazel, lanolin or camphormay also relieve itching. Some people report that getting sunlight or ultraviolet (UV) light treatments in a doctor's office or treatment center helps lessen itching.  Check with your doctor before trying any anti-itch method or product.  Dry skin (xerosis) Dry skin is also a common  condition for patients with end stage renal disease (ESRD). Kidney failure may make changes in the sweat glands and oil glands, which causes the skin to dry out. Dry skin can lead to infections and can cause skin wounds to heal slower than they should. Dry skin can also cause itching.  To prevent or treat dry skin, avoid long, hot showers or baths. Also, look for soaps that have natural, pure ingredients without harsh perfumes and chemicals. A moisturizing soap for sensitive skin can be a good choice. There are also bath products made with oatmeal created for dry, itchy skin that can be found at drug stores.  Apply a moisturizing, high-water content gel, lotion or cream to the body right after bathing, while the skin is still damp. Avoid creams or lotions with alcohol. Ask your doctor or pharmacist about dry skin treatments that are available.  Skin discoloration Many reported cases of discolored skin, or hyperpigmentation, happen to people with ESRD. One cause of skin discoloration is related to pigments called urochromes being retained in the skin. Normally these are excreted by healthy kidneys. Patients with this condition tend to have a grayish, almost metallic color skin.  Another discoloration is called uremic frost. This is a white, powdery substance left on the skin surface after sweat dries. Uremic frost is prevented by getting adequate dialysis.

## 2020-09-27 ENCOUNTER — Ambulatory Visit: Payer: Medicare Other | Admitting: Gastroenterology

## 2020-10-26 ENCOUNTER — Ambulatory Visit (INDEPENDENT_AMBULATORY_CARE_PROVIDER_SITE_OTHER): Payer: Medicaid Other | Admitting: Primary Care

## 2020-10-27 ENCOUNTER — Encounter (HOSPITAL_COMMUNITY): Payer: Self-pay | Admitting: *Deleted

## 2020-10-27 ENCOUNTER — Observation Stay (HOSPITAL_COMMUNITY)
Admission: EM | Admit: 2020-10-27 | Discharge: 2020-10-28 | Disposition: A | Discharge status: Home / Self Care | Payer: Medicare Other | Attending: Family Medicine | Admitting: Family Medicine

## 2020-10-27 ENCOUNTER — Other Ambulatory Visit: Payer: Self-pay

## 2020-10-27 ENCOUNTER — Emergency Department (HOSPITAL_COMMUNITY): Payer: Medicare Other

## 2020-10-27 DIAGNOSIS — N186 End stage renal disease: Secondary | ICD-10-CM | POA: Insufficient documentation

## 2020-10-27 DIAGNOSIS — R569 Unspecified convulsions: Principal | ICD-10-CM | POA: Insufficient documentation

## 2020-10-27 DIAGNOSIS — R509 Fever, unspecified: Secondary | ICD-10-CM | POA: Diagnosis present

## 2020-10-27 DIAGNOSIS — F1011 Alcohol abuse, in remission: Secondary | ICD-10-CM | POA: Diagnosis present

## 2020-10-27 DIAGNOSIS — Y900 Blood alcohol level of less than 20 mg/100 ml: Secondary | ICD-10-CM | POA: Insufficient documentation

## 2020-10-27 DIAGNOSIS — R4182 Altered mental status, unspecified: Secondary | ICD-10-CM

## 2020-10-27 DIAGNOSIS — D649 Anemia, unspecified: Secondary | ICD-10-CM | POA: Diagnosis not present

## 2020-10-27 DIAGNOSIS — R1084 Generalized abdominal pain: Secondary | ICD-10-CM | POA: Diagnosis not present

## 2020-10-27 DIAGNOSIS — Z79899 Other long term (current) drug therapy: Secondary | ICD-10-CM | POA: Diagnosis not present

## 2020-10-27 DIAGNOSIS — I120 Hypertensive chronic kidney disease with stage 5 chronic kidney disease or end stage renal disease: Secondary | ICD-10-CM | POA: Insufficient documentation

## 2020-10-27 DIAGNOSIS — F17210 Nicotine dependence, cigarettes, uncomplicated: Secondary | ICD-10-CM | POA: Diagnosis not present

## 2020-10-27 DIAGNOSIS — I10 Essential (primary) hypertension: Secondary | ICD-10-CM

## 2020-10-27 DIAGNOSIS — Z20822 Contact with and (suspected) exposure to covid-19: Secondary | ICD-10-CM | POA: Insufficient documentation

## 2020-10-27 DIAGNOSIS — E1122 Type 2 diabetes mellitus with diabetic chronic kidney disease: Secondary | ICD-10-CM | POA: Diagnosis not present

## 2020-10-27 DIAGNOSIS — Z992 Dependence on renal dialysis: Secondary | ICD-10-CM | POA: Diagnosis not present

## 2020-10-27 DIAGNOSIS — F1010 Alcohol abuse, uncomplicated: Secondary | ICD-10-CM | POA: Diagnosis present

## 2020-10-27 DIAGNOSIS — F1411 Cocaine abuse, in remission: Secondary | ICD-10-CM | POA: Diagnosis present

## 2020-10-27 DIAGNOSIS — K264 Chronic or unspecified duodenal ulcer with hemorrhage: Secondary | ICD-10-CM | POA: Diagnosis present

## 2020-10-27 LAB — CBC WITH DIFFERENTIAL/PLATELET
Abs Immature Granulocytes: 0.05 10*3/uL (ref 0.00–0.07)
Basophils Absolute: 0 10*3/uL (ref 0.0–0.1)
Basophils Relative: 0 %
Eosinophils Absolute: 0.1 10*3/uL (ref 0.0–0.5)
Eosinophils Relative: 1 %
HCT: 31.5 % — ABNORMAL LOW (ref 39.0–52.0)
Hemoglobin: 10.1 g/dL — ABNORMAL LOW (ref 13.0–17.0)
Immature Granulocytes: 1 %
Lymphocytes Relative: 20 %
Lymphs Abs: 1.5 10*3/uL (ref 0.7–4.0)
MCH: 35.7 pg — ABNORMAL HIGH (ref 26.0–34.0)
MCHC: 32.1 g/dL (ref 30.0–36.0)
MCV: 111.3 fL — ABNORMAL HIGH (ref 80.0–100.0)
Monocytes Absolute: 1.2 10*3/uL — ABNORMAL HIGH (ref 0.1–1.0)
Monocytes Relative: 16 %
Neutro Abs: 4.9 10*3/uL (ref 1.7–7.7)
Neutrophils Relative %: 62 %
Platelets: 117 10*3/uL — ABNORMAL LOW (ref 150–400)
RBC: 2.83 MIL/uL — ABNORMAL LOW (ref 4.22–5.81)
RDW: 18.3 % — ABNORMAL HIGH (ref 11.5–15.5)
WBC: 7.8 10*3/uL (ref 4.0–10.5)
nRBC: 0.8 % — ABNORMAL HIGH (ref 0.0–0.2)

## 2020-10-27 LAB — LIPASE, BLOOD: Lipase: 54 U/L — ABNORMAL HIGH (ref 11–51)

## 2020-10-27 LAB — RESP PANEL BY RT-PCR (FLU A&B, COVID) ARPGX2
Influenza A by PCR: NEGATIVE
Influenza B by PCR: NEGATIVE
SARS Coronavirus 2 by RT PCR: NEGATIVE

## 2020-10-27 LAB — COMPREHENSIVE METABOLIC PANEL
ALT: 36 U/L (ref 0–44)
AST: 88 U/L — ABNORMAL HIGH (ref 15–41)
Albumin: 2.5 g/dL — ABNORMAL LOW (ref 3.5–5.0)
Alkaline Phosphatase: 67 U/L (ref 38–126)
Anion gap: 14 (ref 5–15)
BUN: 23 mg/dL — ABNORMAL HIGH (ref 6–20)
CO2: 27 mmol/L (ref 22–32)
Calcium: 8 mg/dL — ABNORMAL LOW (ref 8.9–10.3)
Chloride: 96 mmol/L — ABNORMAL LOW (ref 98–111)
Creatinine, Ser: 7.1 mg/dL — ABNORMAL HIGH (ref 0.61–1.24)
GFR, Estimated: 8 mL/min — ABNORMAL LOW (ref >60)
Glucose, Bld: 104 mg/dL — ABNORMAL HIGH (ref 70–99)
Potassium: 4.3 mmol/L (ref 3.5–5.1)
Sodium: 137 mmol/L (ref 135–145)
Total Bilirubin: 0.6 mg/dL (ref 0.3–1.2)
Total Protein: 7.8 g/dL (ref 6.5–8.1)

## 2020-10-27 LAB — PROTIME-INR
INR: 1.1 (ref 0.8–1.2)
Prothrombin Time: 13.7 seconds (ref 11.4–15.2)

## 2020-10-27 LAB — LACTIC ACID, PLASMA: Lactic Acid, Venous: 1.4 mmol/L (ref 0.5–1.9)

## 2020-10-27 MED ORDER — CALCIUM ACETATE (PHOS BINDER) 667 MG PO CAPS
1334.0000 mg | ORAL_CAPSULE | Freq: Three times a day (TID) | ORAL | Status: DC
  Administered 2020-10-28 (×2): 1334 mg via ORAL
  Filled 2020-10-27 (×2): qty 2

## 2020-10-27 MED ORDER — INSULIN ASPART 100 UNIT/ML IJ SOLN
0.0000 [IU] | Freq: Three times a day (TID) | INTRAMUSCULAR | Status: DC
  Administered 2020-10-28: 1 [IU] via SUBCUTANEOUS

## 2020-10-27 MED ORDER — GABAPENTIN 100 MG PO CAPS: 100.0000 mg | ORAL_CAPSULE | Freq: Every day | ORAL | Status: DC

## 2020-10-27 MED ORDER — METRONIDAZOLE 500 MG/100ML IV SOLN
500.0000 mg | Freq: Once | INTRAVENOUS | Status: AC
  Administered 2020-10-27: 500 mg via INTRAVENOUS
  Filled 2020-10-27: qty 100

## 2020-10-27 MED ORDER — ONDANSETRON HCL 4 MG/2ML IJ SOLN: 4.0000 mg | Freq: Four times a day (QID) | INTRAMUSCULAR | Status: DC | PRN

## 2020-10-27 MED ORDER — ONDANSETRON HCL 4 MG PO TABS: 4.0000 mg | ORAL_TABLET | Freq: Four times a day (QID) | ORAL | Status: DC | PRN

## 2020-10-27 MED ORDER — POLYETHYLENE GLYCOL 3350 17 G PO PACK: 17.0000 g | PACK | Freq: Every day | ORAL | Status: DC | PRN

## 2020-10-27 MED ORDER — VANCOMYCIN VARIABLE DOSE PER UNSTABLE RENAL FUNCTION (PHARMACIST DOSING): Status: DC

## 2020-10-27 MED ORDER — ACETAMINOPHEN 650 MG RE SUPP: 650.0000 mg | Freq: Four times a day (QID) | RECTAL | Status: DC | PRN

## 2020-10-27 MED ORDER — TRIAMCINOLONE ACETONIDE 0.1 % EX CREA
1.0000 "application " | TOPICAL_CREAM | Freq: Two times a day (BID) | CUTANEOUS | Status: DC
  Administered 2020-10-28: 1 via TOPICAL
  Filled 2020-10-27: qty 15

## 2020-10-27 MED ORDER — PANTOPRAZOLE SODIUM 40 MG PO TBEC
40.0000 mg | DELAYED_RELEASE_TABLET | Freq: Every day | ORAL | Status: DC
  Administered 2020-10-28: 40 mg via ORAL
  Filled 2020-10-27: qty 1

## 2020-10-27 MED ORDER — SODIUM CHLORIDE 0.9 % IV SOLN: 1.0000 g | INTRAVENOUS | Status: DC

## 2020-10-27 MED ORDER — ACETAMINOPHEN 325 MG PO TABS
650.0000 mg | ORAL_TABLET | Freq: Once | ORAL | Status: AC
  Administered 2020-10-27: 650 mg via ORAL
  Filled 2020-10-27: qty 2

## 2020-10-27 MED ORDER — ACETAMINOPHEN 325 MG PO TABS: 650.0000 mg | ORAL_TABLET | Freq: Four times a day (QID) | ORAL | Status: DC | PRN

## 2020-10-27 MED ORDER — SODIUM CHLORIDE 0.9 % IV SOLN: Freq: Once | INTRAVENOUS | Status: AC

## 2020-10-27 MED ORDER — SODIUM CHLORIDE 0.9 % IV SOLN
2.0000 g | Freq: Once | INTRAVENOUS | Status: AC
  Administered 2020-10-27: 2 g via INTRAVENOUS
  Filled 2020-10-27: qty 2

## 2020-10-27 MED ORDER — VANCOMYCIN HCL 10 G IV SOLR
2250.0000 mg | Freq: Once | INTRAVENOUS | Status: AC
  Administered 2020-10-27: 2250 mg via INTRAVENOUS
  Filled 2020-10-27: qty 2250
  Filled 2020-10-27: qty 2000

## 2020-10-27 NOTE — ED Provider Notes (Signed)
[facility] EMERGENCY DEPARTMENT Provider Note   CSN: [id number] Arrival date & time: 10/27/20  1710     History Chief Complaint  Patient presents with   Altered Mental Status    Daniel [name] is a 60 y.o. male.  Presents here with concern for altered mental status.  Per EMS, patient was found in hot car outside a body shop.  Called for concern of altered mental status.  Patient is able to provide some basic history but limited due to being somewhat lethargic.  States he completed full dialysis session, did not have fever at dialysis, concerned that they may have taken off too much, is unsure how much was taken off today.  Patient was provided some IV fluids by EMS.  They states that his temperature was 102.5 F.  Patient states he is having some abdominal discomfort but denies any other acute complaints.   Extensive past medical history including history of ESRD on dialysis, diabetes. HPI     Past Medical History:  Diagnosis Date   Anemia    Diabetes mellitus without complication [facility])    patient denies   Dialysis patient [facility])    End stage chronic kidney disease ([place])    Hypertension    ICH (intracerebral hemorrhage) (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   ESRD (end stage renal disease) ([place])    AVM (arteriovenous malformation) of small bowel, acquired with hemorrhage    Symptomatic anemia 07/31/2019   Acute on chronic anemia 07/23/2019   Chronic kidney disease, stage V (H[facility])    Chronic hepatitis C without hepatic coma ([place]) 07/11/2019   Iron deficiency anemia 03/30/2019   CKD (chronic kidney disease), stage V ([place]) 03/30/2019   Alcohol use disorder 03/30/2019   B12 deficiency 03/30/2019   Orthostatic hypotension 01/22/2019    Anemia    Hypertension 06/29/2016   DM2 (diabetes mellitus, type 2) ([place]) 06/29/2016    Past Surgical History:  Procedure Laterality Date   A/V FISTULAGRAM N/A 08/15/2020   Procedure: A/V FISTULAGRAM - Left Upper;  Surgeon: Cherre Robins, MD;  Location: [place] CV LAB;  Service: Cardiovascular;  Laterality: N/A;   APPENDECTOMY     AV FISTULA PLACEMENT Left 08/03/2019   Procedure: LEFT ARM ARTERIOVENOUS (AV) CEPHALIC  FISTULA CREATION;  Surgeon: Waynetta Sandy, MD;  Location: [place];  Service: Vascular;  Laterality: Left;   BIOPSY  06/30/2019   Procedure: BIOPSY;  Surgeon: Ronald Lobo, MD;  Location: [place];  Service: Endoscopy;;   BIOPSY  08/02/2019   Procedure: BIOPSY;  Surgeon: Yetta Flock, MD;  Location: [place];  Service: Gastroenterology;;   COLONOSCOPY  01/23/2012   Procedure: COLONOSCOPY;  Surgeon: Danie Binder, MD;  [facility];  Service: Endoscopy;  Laterality: N/A;  11:10 AM   COLONOSCOPY WITH PROPOFOL N/A 06/30/2019   Procedure: COLONOSCOPY WITH PROPOFOL;  Surgeon: Ronald Lobo, MD;  Location: [place];  Service: Endoscopy;  Laterality: N/A;   ENTEROSCOPY N/A 08/02/2019   Procedure: ENTEROSCOPY;  Surgeon: Yetta Flock, MD;  Location: [facility] ENDOSCOPY;  Service: Gastroenterology;  Laterality: N/A;   ESOPHAGOGASTRODUODENOSCOPY N/A 08/10/2020   Procedure: ESOPHAGOGASTRODUODENOSCOPY (EGD);  Surgeon: Jerene Bears, MD;  Location: [facility] ENDOSCOPY;  Service: Gastroenterology;  Laterality:  N/A;   ESOPHAGOGASTRODUODENOSCOPY (EGD) WITH PROPOFOL N/A 06/30/2019   Procedure: ESOPHAGOGASTRODUODENOSCOPY (EGD) WITH PROPOFOL;  Surgeon: Ronald Lobo, MD;  Location: [place];  Service: Endoscopy;  Laterality: N/A;   FISTULA SUPERFICIALIZATION Left 10/17/2019   Procedure: LEFT UPPER EXTREMITY FISTULA REVISION, SIDE BRANCH LIGATION,  AND SUPERFICIALIZATION;  Surgeon: Marty Heck, MD;  Location: [place];  Service: Vascular;  Laterality:  Left;   GIVENS CAPSULE STUDY N/A 06/30/2019   Procedure: GIVENS CAPSULE STUDY;  Surgeon: Ronald Lobo, MD;  Location: [place];  Service: Endoscopy;  Laterality: N/A;   HEMOSTASIS CLIP PLACEMENT  08/10/2020   Procedure: HEMOSTASIS CLIP PLACEMENT;  Surgeon: Jerene Bears, MD;  Location: [place] ENDOSCOPY;  Service: Gastroenterology;;   HEMOSTASIS CONTROL  08/02/2019   Procedure: HEMOSTASIS CONTROL;  Surgeon: Yetta Flock, MD;  Location: [place] ENDOSCOPY;  Service: Gastroenterology;;   INCISION AND DRAINAGE ABSCESS N/A 06/29/2016   Procedure: INCISION AND DRAINAGE ABDOMINAL WALL ABSCESS;  Surgeon: Alphonsa Overall, MD;  Location: [facility];  Service: General;  Laterality: N/A;   INSERTION OF DIALYSIS CATHETER Right 08/03/2019   Procedure: INSERTION OF DIALYSIS CATHETER;  Surgeon: Waynetta Sandy, MD;  Location: [place];  Service: Vascular;  Laterality: Right;   INSERTION OF DIALYSIS CATHETER Right 10/22/2019   Procedure: INSERTION OF 23CM TUNNELED DIALYSIS CATHETER RIGHT INTERNAL JUGULAR;  Surgeon: Angelia Mould, MD;  Location: [place];  Service: Vascular;  Laterality: Right;   INSERTION OF DIALYSIS CATHETER Right 08/12/2020   Procedure: INSERTION OF Right internal Jugular TUNNELED  DIALYSIS CATHETER.;  Surgeon: Waynetta Sandy, MD;  Location: [place];  Service: Vascular;  Laterality: Right;   Left heel surgery     PENILE BIOPSY N/A 03/26/2020   Procedure: PENILE ULCER DEBRIDEMENT;  Surgeon: Remi Haggard, MD;  Location: [facility];  Service: Urology;  Laterality: N/A;  26 MINS       Family History  Problem Relation Age of Onset   Colon cancer Neg Hx     Social History   Tobacco Use   Smoking status: Every Day    Packs/day: 1.00    Years: 30.00    Pack years: 30.00    Types: Cigarettes   Smokeless tobacco: Never  Vaping Use   Vaping Use: Never used  Substance Use Topics   Alcohol use: Not Currently    Alcohol/week: 12.0 standard drinks    Types: 12 Cans of beer per  week   Drug use: Not Currently    Types: "Crack" cocaine    Comment: last in 2020    Home Medications Prior to Admission medications   Medication Sig Start Date End Date Taking? Authorizing Provider  Calcium Acetate 667 MG TABS Take 1,334 mg by mouth 4 (four) times daily. 09/11/20  Yes [provider]  cetirizine (ZYRTEC ALLERGY) 10 MG tablet Take 1 tablet (10 mg total) by mouth daily as needed for allergies. 08/22/20  Yes [name], NP  cloNIDine (CATAPRES) 0.2 MG tablet Take  1 tablet twice daily . Patient taking differently: Take 0.2 mg by mouth 2 (two) times daily. 09/26/20  Yes [name], NP  gabapentin (NEURONTIN) 100 MG capsule Take 1 capsule (100 mg total) by mouth at bedtime. 08/16/20  Yes Florencia Reasons, MD  hydrOXYzine (ATARAX/VISTARIL) 25 MG tablet Take 1 tablet (25 mg total) by mouth every 8 (eight) hours as needed for anxiety or itching. 09/26/20  Yes [name], NP  triamcinolone cream (KENALOG) 0.1 % Apply 1 application topically  2 (two) times daily. 08/02/20  Yes [provider]  blood glucose meter kit and supplies KIT Dispense based on patient and insurance preference. Use up to four times daily as directed. (FOR ICD-9 250.00, 250.01). Patient not taking: Reported on 09/26/2020 07/01/16   Nita Sells, MD  calamine lotion Apply 1 application topically as needed for itching. Patient not taking: No sig reported 09/26/20   [name], NP  nicotine (NICODERM CQ) 14 mg/24hr patch Place 1 patch (14 mg total) onto the skin daily. Patient not taking: No sig reported 09/26/20   [name], NP  nicotine (NICODERM CQ) 21 mg/24hr patch Place 1 patch (21 mg total) onto the skin daily. Patient not taking: No sig reported 09/26/20   [name], NP  nicotine (NICODERM CQ) 7 mg/24hr patch Place 1 patch (7 mg total) onto the skin daily. Patient not taking: No sig reported 09/26/20   [name], NP  oxyCODONE-acetaminophen  (PERCOCET/ROXICET) 5-325 MG tablet Take 1 tablet by mouth every 6 (six) hours as needed for severe pain. Patient not taking: Reported on 11/28/5051 01/02/66   Delora Fuel, MD  pantoprazole (PROTONIX) 40 MG tablet Take 1 tablet (40 mg total) by mouth 2 (two) times daily. Patient not taking: Reported on 10/27/2020 09/26/20 10/26/20  [name], NP  colchicine 0.6 MG tablet Take 0.5 tablets (0.3 mg total) by mouth 2 (two) times daily. 07/24/20 07/29/20  Noemi Chapel, MD  ferrous sulfate 325 (65 FE) MG tablet SMARTSIG:1 Tablet(s) By Mouth Twice Daily 08/11/19 02/09/20  [provider]  furosemide (LASIX) 40 MG tablet Take 1 tablet (40 mg total) by mouth daily. 07/25/19 02/09/20  Ladona Horns, MD    Allergies    Dilaudid [hydromorphone hcl]  Review of Systems   Review of Systems  Unable to perform ROS: Mental status change   Physical Exam Updated Vital Signs BP 114/65   Pulse 82   Temp 98.8 F (37.1 C) (Oral)   Resp 18   Ht 6' (1.829 m)   Wt 122.5 kg   SpO2 97%   BMI 36.62 kg/m   Physical Exam Vitals and nursing note reviewed.  Constitutional:      Appearance: He is well-developed.     Comments: Mildly lethargic but not in distress  HENT:     Head: Normocephalic and atraumatic.  Eyes:     Conjunctiva/sclera: Conjunctivae normal.  Cardiovascular:     Rate and Rhythm: Normal rate and regular rhythm.     Heart sounds: No murmur heard. Pulmonary:     Effort: Pulmonary effort is normal. No respiratory distress.     Breath sounds: Normal breath sounds.  Abdominal:     Palpations: Abdomen is soft.     Tenderness: There is abdominal tenderness.     Comments: There is mild generalized tenderness to palpation, no rebound or guarding  Musculoskeletal:        General: No deformity or signs of injury.     Cervical back: Neck supple.  Skin:    General: Skin is warm and dry.  Neurological:     Mental Status: He is alert.     Comments: Patient is grossly alert, slightly  lethargic but answers basic questions appropriately    ED Results / Procedures / Treatments   Labs (all labs ordered are listed, but only abnormal results are displayed) Labs Reviewed  COMPREHENSIVE METABOLIC PANEL - Abnormal; Notable for the following components:      Result Value  Chloride 96 (*)    Glucose, Bld 104 (*)    BUN 23 (*)    Creatinine, Ser 7.10 (*)    Calcium 8.0 (*)    Albumin 2.5 (*)    AST 88 (*)    GFR, Estimated 8 (*)    All other components within normal limits  CBC WITH DIFFERENTIAL/PLATELET - Abnormal; Notable for the following components:   RBC 2.83 (*)    Hemoglobin 10.1 (*)    HCT 31.5 (*)    MCV 111.3 (*)    MCH 35.7 (*)    RDW 18.3 (*)    Platelets 117 (*)    nRBC 0.8 (*)    Monocytes Absolute 1.2 (*)    All other components within normal limits  LIPASE, BLOOD - Abnormal; Notable for the following components:   Lipase 54 (*)    All other components within normal limits  RESP PANEL BY RT-PCR (FLU A&B, COVID) ARPGX2  CULTURE, BLOOD (ROUTINE X 2)  CULTURE, BLOOD (ROUTINE X 2)  LACTIC ACID, PLASMA  PROTIME-INR  LACTIC ACID, PLASMA  URINALYSIS, ROUTINE W REFLEX MICROSCOPIC    EKG EKG Interpretation  Date/Time:  Saturday October 27 2020 17:20:59 EDT Ventricular Rate:  91 PR Interval:  157 QRS Duration: 78 QT Interval:  387 QTC Calculation: 477 R Axis:   -11 Text Interpretation: Sinus rhythm LVH by voltage Inferior infarct, old Confirmed by Madalyn Rob [phone]) on 10/27/2020 7:48:00 PM  Radiology CT ABDOMEN PELVIS WO CONTRAST  Result Date: 10/27/2020 CLINICAL DATA:  Altered mental status. Hemodialysis patient. Seizure activity EXAM: CT ABDOMEN AND PELVIS WITHOUT CONTRAST TECHNIQUE: Multidetector CT imaging of the abdomen and pelvis was performed following the standard protocol without IV contrast. COMPARISON:  CT 08/10/2020 FINDINGS: Lower chest: Lung bases are clear. Hepatobiliary: No focal hepatic lesion on noncontrast exam. Small  gallstone within a nondistended gallbladder. Pancreas: Mild haziness about the head of the pancreas (image 31/3). Fluid collections. No pancreatic duct dilatation. Spleen: Normal spleen Adrenals/urinary tract: Adrenal glands and kidneys are normal. The ureters and bladder normal. Stomach/Bowel: Stomach, small-bowel and cecum are normal. The appendix is not identified but there is no pericecal inflammation to suggest appendicitis. The colon and rectosigmoid colon are normal. Vascular/Lymphatic: Abdominal aorta is normal caliber with atherosclerotic calcification. There is no retroperitoneal or periportal lymphadenopathy. No pelvic lymphadenopathy. Reproductive: Prostate unremarkable Other: No free fluid. Musculoskeletal: No aggressive osseous lesion. IMPRESSION: 1. Mild haziness about the head of the pancreas could represent mild focal pancreatitis. Recommend clinical/laboratory correlation. 2. No bowel inflammation or abnormality. 3.  Aortic Atherosclerosis (ICD10-I70.0). Electronically Signed   By: Suzy Bouchard M.D.   On: 10/27/2020 19:01   CT Head Wo Contrast  Result Date: 10/27/2020 CLINICAL DATA:  Delirium, altered mental status EXAM: CT HEAD WITHOUT CONTRAST TECHNIQUE: Contiguous axial images were obtained from the base of the skull through the vertex without intravenous contrast. COMPARISON:  CT 07/23/2019 FINDINGS: Brain: Stable appearance of remote lacunar type infarcts in the right caudate, left thalamus and basal ganglia. No evidence of acute infarction, hemorrhage, hydrocephalus, extra-axial collection, visible mass lesion or mass effect. Symmetric prominence of the ventricles, cisterns and sulci compatible with parenchymal volume loss. Patchy areas of white matter hypoattenuation are most compatible with chronic microvascular angiopathy. Vascular: Atherosclerotic calcification of the carotid siphons and intradural vertebral arteries. No hyperdense vessel. Skull: No calvarial fracture or suspicious  osseous lesion. No scalp swelling or hematoma. Sinuses/Orbits: Paranasal sinuses and mastoid air cells are predominantly clear. Included orbital structures  are unremarkable. Other: None IMPRESSION: No acute intracranial mass abnormality. 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 Background of microvascular angiopathy, parenchymal volume loss and intracranial atherosclerosis. Electronically Signed   By: Lovena Le M.D.   On: 10/27/2020 19:01   DG Chest Portable 1 View  Result Date: 10/27/2020 CLINICAL DATA:  Fever.  Confusion. EXAM: PORTABLE CHEST 1 VIEW COMPARISON:  09/09/2020 FINDINGS: Heart size is enlarged and stable. There has been removal of RIGHT IJ central line. Lungs are free of focal consolidations and pleural effusions. No pulmonary edema. IMPRESSION: Stable cardiomegaly. Electronically Signed   By: Nolon Nations M.D.   On: 10/27/2020 18:15    Procedures Procedures   Medications Ordered in ED Medications  vancomycin (VANCOCIN) 2,250 mg in sodium chloride 0.9 % 500 mL IVPB (has no administration in time range)  vancomycin variable dose per unstable renal function (pharmacist dosing) (has no administration in time range)  ceFEPIme (MAXIPIME) 1 g in sodium chloride 0.9 % 100 mL IVPB (has no administration in time range)  acetaminophen (TYLENOL) tablet 650 mg (650 mg Oral Given 10/27/20 1756)  ceFEPIme (MAXIPIME) 2 g in sodium chloride 0.9 % 100 mL IVPB (0 g Intravenous Stopped 10/27/20 1917)  metroNIDAZOLE (FLAGYL) IVPB 500 mg (0 mg Intravenous Stopped 10/27/20 1916)  0.9 %  sodium chloride infusion (0 mLs Intravenous Stopped 10/27/20 2042)    ED Course  I have reviewed the triage vital signs and the nursing notes.  Pertinent labs & imaging results that were available during my care of the patient were reviewed by me and considered in my medical decision making (see chart for details).    MDM Rules/Calculators/A&P                           60 year old male presented to ER with concern for altered mental status and fever.  On exam initially he appeared mildly lethargic but not in any distress.  Did note mild tenderness on exam but abdomen was soft.  Given his extensive past medical history including a history of ESRD on dialysis, completed broad initial work-up for possible sepsis.  Provided empiric antibiotics.  Did not provide any large fluid bolus given he is a dialysis patient and was not hypotensive.  His basic labs were grossly stable.  The CT scan of his head was negative.  The CT scan of his abdomen did comment on slight abnormality of the pancreas, his lipase was only minimally elevated.  Doubt this is the cause of his fever.  On reassessment, patient had no ongoing symptoms and was much more alert.  No neck pain, no ongoing confusion, doubt CNS infection.  Notably, he is unable to recall any of the events about being found in the car.  Concerned he may have had syncopal episode.  Given the history as well as the fever and his medical comorbidities, believe patient would benefit from observation admission to further investigate possible syncope versus infection, empiric antibiotics and following up blood cultures.  Consult unassigned medicine for admission.  Final Clinical Impression(s) / ED Diagnoses Final diagnoses:  Altered mental status, unspecified altered mental status type  Fever, unspecified fever cause    Rx / DC Orders ED Discharge Orders     None        [name], MD 10/27/20 2140

## 2020-10-27 NOTE — ED Notes (Signed)
EDP at [facility]. Portable CXR at [facility].

## 2020-10-27 NOTE — ED Triage Notes (Signed)
Fever 102.5 on arrival. Here by [facility]. Found in hot car outside of body shop. Owners called EMS for possible sz. No known sz activity. Here for AMS. Some confusion, follows some commands. CVA screen negative. Had HD today, "feels like they took off too much". States, "did not have fever at HD". Arrives with NSL, zofran given PTA.NS IVF 400cc given PTA with improvement. HD T, TH, S. R upper arm AVG.12 lead OK per EMS.

## 2020-10-27 NOTE — H&P (Signed)
History and Physical    Daniel [name] EAV:[id number] DOB: 1961-03-10 DOA: 10/27/2020  PCP: [name], NP  Patient coming from: via EMS from public location off of [address].   Chief Complaint:  Chief Complaint  Patient presents with   Altered Mental Status     HPI:    60 year old male with past medical history of ESRD (Tues, Thurs, Saturday), hypertension, non-insulin-dependent diabetes mellitus, multifactorial anemia (with recent bleeding duodenal ulcer 07/2020),, penile calciphylaxis, alcohol abuse in remission, cocaine abuse in remission and nicotine dependence who presents to [facility] emergency department via EMS after patient found in his vehicle exhibiting shaking activity by a bystander.  Patient states that he went for his dialysis session earlier today and that his dialysis session was completed without any issues.  Patient states that he left his dialysis center and was driving home but that is the last thing he recalls.  He understands that emergency responders found him parked off of [address] [facility]. but does not know why he was in that part of town after leaving dialysis.  Remainder of history was obtained from EMS.  EMS states that they were contacted after a bystander witnessed the patient exhibiting shaking activity in his vehicle.  EMS states that patient was extremely hot to the touch and was quite lethargic upon initial assessment.  Patient had a "reddish substance" all over his mouth and tongue.  Patient was found to be 102.5 F.  Patient was administered 400 cc of normal saline, supplemental oxygen, intravenous Zofran and brought to [facility] emergency permit for evaluation.  Upon evaluation in the emergency department, patient continued to be febrile on arrival with temperature as high as 102.9 F.  650 mg of Tylenol was administered.  Patient was administered broad-spectrum intravenous antibiotics including cefepime, metronidazole and  vancomycin.  CT imaging of the head was performed and revealed no evidence of acute disease.  CT imaging of the abdomen and pelvis revealed some vague haziness around the pancreas but otherwise was unremarkable.  Labs were mostly unremarkable with normal white blood cell count, normal lactic acid level and no significant electrolyte derangement.  Throughout the emergency department evaluation patient's lethargy had essentially completely resolved.  No further episodes of seizure like activity occurred.  The hospitalist group was then called to assess the patient for admission to the hospital.  Review of Systems:   Review of Systems  Unable to perform ROS: Mental acuity   Past Medical History:  Diagnosis Date   Anemia    Diabetes mellitus without complication [facility])    patient denies   Dialysis patient [facility])    End stage chronic kidney disease ([place])    Hypertension    ICH (intracerebral hemorrhage) ([place]) 05/20/2017   Shoulder pain, left 06/28/2013    Past Surgical History:  Procedure Laterality Date   A/V FISTULAGRAM N/A 08/15/2020   Procedure: A/V FISTULAGRAM - Left Upper;  Surgeon: Cherre Robins, MD;  Location: [place] CV LAB;  Service: Cardiovascular;  Laterality: N/A;   APPENDECTOMY     AV FISTULA PLACEMENT Left 08/03/2019   Procedure: LEFT ARM ARTERIOVENOUS (AV) CEPHALIC  FISTULA CREATION;  Surgeon: Waynetta Sandy, MD;  Location: [place];  Service: Vascular;  Laterality: Left;   BIOPSY  06/30/2019   Procedure: BIOPSY;  Surgeon: Ronald Lobo, MD;  Location: [place];  Service: Endoscopy;;   BIOPSY  08/02/2019   Procedure: BIOPSY;  Surgeon: Yetta Flock, MD;  Location: [facility]  ENDOSCOPY;  Service: Gastroenterology;;   COLONOSCOPY  01/23/2012   Procedure: COLONOSCOPY;  Surgeon: Danie Binder, MD;  [facility];  Service: Endoscopy;  Laterality: N/A;  11:10 AM   COLONOSCOPY WITH PROPOFOL N/A 06/30/2019   Procedure: COLONOSCOPY WITH PROPOFOL;  Surgeon: Ronald Lobo, MD;  Location: [place];  Service: Endoscopy;  Laterality: N/A;   ENTEROSCOPY N/A 08/02/2019   Procedure: ENTEROSCOPY;  Surgeon: Yetta Flock, MD;  Location: [facility] ENDOSCOPY;  Service: Gastroenterology;  Laterality: N/A;   ESOPHAGOGASTRODUODENOSCOPY N/A 08/10/2020   Procedure: ESOPHAGOGASTRODUODENOSCOPY (EGD);  Surgeon: Jerene Bears, MD;  Location: [facility] ENDOSCOPY;  Service: Gastroenterology;  Laterality: N/A;   ESOPHAGOGASTRODUODENOSCOPY (EGD) WITH PROPOFOL N/A 06/30/2019   Procedure: ESOPHAGOGASTRODUODENOSCOPY (EGD) WITH PROPOFOL;  Surgeon: Ronald Lobo, MD;  Location: [place];  Service: Endoscopy;  Laterality: N/A;   FISTULA SUPERFICIALIZATION Left 10/17/2019   Procedure: LEFT UPPER EXTREMITY FISTULA REVISION, SIDE BRANCH LIGATION,  AND SUPERFICIALIZATION;  Surgeon: Marty Heck, MD;  Location: [place];  Service: Vascular;  Laterality: Left;   GIVENS CAPSULE STUDY N/A 06/30/2019   Procedure: GIVENS CAPSULE STUDY;  Surgeon: Ronald Lobo, MD;  Location: [place];  Service: Endoscopy;  Laterality: N/A;   HEMOSTASIS CLIP PLACEMENT  08/10/2020   Procedure: HEMOSTASIS CLIP PLACEMENT;  Surgeon: Jerene Bears, MD;  Location: [place] ENDOSCOPY;  Service: Gastroenterology;;   HEMOSTASIS CONTROL  08/02/2019   Procedure: HEMOSTASIS CONTROL;  Surgeon: Yetta Flock, MD;  Location: [place] ENDOSCOPY;  Service: Gastroenterology;;   INCISION AND DRAINAGE ABSCESS N/A 06/29/2016   Procedure: INCISION AND DRAINAGE ABDOMINAL WALL ABSCESS;  Surgeon: Alphonsa Overall, MD;  Location: [facility];  Service: General;  Laterality: N/A;   INSERTION OF DIALYSIS CATHETER Right 08/03/2019   Procedure: INSERTION OF DIALYSIS CATHETER;  Surgeon: Waynetta Sandy, MD;  Location: [place];  Service: Vascular;  Laterality: Right;   INSERTION OF DIALYSIS CATHETER Right 10/22/2019   Procedure: INSERTION OF 23CM TUNNELED DIALYSIS CATHETER RIGHT INTERNAL JUGULAR;  Surgeon: Angelia Mould, MD;  Location: [place];   Service: Vascular;  Laterality: Right;   INSERTION OF DIALYSIS CATHETER Right 08/12/2020   Procedure: INSERTION OF Right internal Jugular TUNNELED  DIALYSIS CATHETER.;  Surgeon: Waynetta Sandy, MD;  Location: [place];  Service: Vascular;  Laterality: Right;   Left heel surgery     PENILE BIOPSY N/A 03/26/2020   Procedure: PENILE ULCER DEBRIDEMENT;  Surgeon: Remi Haggard, MD;  Location: [facility];  Service: Urology;  Laterality: N/A;  30 MINS     reports that he has been smoking cigarettes. He has a 30.00 pack-year smoking history. He has never used smokeless tobacco. He reports previous alcohol use of about 12.0 standard drinks of alcohol per week. He reports previous drug use. Drug: "Crack" cocaine.  Allergies  Allergen Reactions   Dilaudid [Hydromorphone Hcl] Itching and Other (See Comments)    Pt reports itchiness after IM injection     Family History  Problem Relation Age of Onset   Colon cancer Neg Hx      Prior to Admission medications   Medication Sig Start Date End Date Taking? Authorizing Provider  Calcium Acetate 667 MG TABS Take 1,334 mg by mouth 4 (four) times daily. 09/11/20  Yes [provider]  cetirizine (ZYRTEC ALLERGY) 10 MG tablet Take 1 tablet (10 mg total) by mouth daily as needed for allergies. 08/22/20  Yes [name], NP  cloNIDine (CATAPRES) 0.2 MG tablet Take  1 tablet twice daily .  Patient taking differently: Take 0.2 mg by mouth 2 (two) times daily. 09/26/20  Yes [name], NP  gabapentin (NEURONTIN) 100 MG capsule Take 1 capsule (100 mg total) by mouth at bedtime. 08/16/20  Yes Florencia Reasons, MD  hydrOXYzine (ATARAX/VISTARIL) 25 MG tablet Take 1 tablet (25 mg total) by mouth every 8 (eight) hours as needed for anxiety or itching. 09/26/20  Yes [name], NP  triamcinolone cream (KENALOG) 0.1 % Apply 1 application topically 2 (two) times daily. 08/02/20  Yes [provider]  blood glucose meter kit and supplies KIT  Dispense based on patient and insurance preference. Use up to four times daily as directed. (FOR ICD-9 250.00, 250.01). Patient not taking: Reported on 09/26/2020 07/01/16   Nita Sells, MD  calamine lotion Apply 1 application topically as needed for itching. Patient not taking: No sig reported 09/26/20   [name], NP  nicotine (NICODERM CQ) 14 mg/24hr patch Place 1 patch (14 mg total) onto the skin daily. Patient not taking: No sig reported 09/26/20   [name], NP  nicotine (NICODERM CQ) 21 mg/24hr patch Place 1 patch (21 mg total) onto the skin daily. Patient not taking: No sig reported 09/26/20   [name], NP  nicotine (NICODERM CQ) 7 mg/24hr patch Place 1 patch (7 mg total) onto the skin daily. Patient not taking: No sig reported 09/26/20   [name], NP  oxyCODONE-acetaminophen (PERCOCET/ROXICET) 5-325 MG tablet Take 1 tablet by mouth every 6 (six) hours as needed for severe pain. Patient not taking: Reported on 07/28/6382 08/29/62   Delora Fuel, MD  pantoprazole (PROTONIX) 40 MG tablet Take 1 tablet (40 mg total) by mouth 2 (two) times daily. Patient not taking: Reported on 10/27/2020 09/26/20 10/26/20  [name], NP  colchicine 0.6 MG tablet Take 0.5 tablets (0.3 mg total) by mouth 2 (two) times daily. 07/24/20 07/29/20  Noemi Chapel, MD  ferrous sulfate 325 (65 FE) MG tablet SMARTSIG:1 Tablet(s) By Mouth Twice Daily 08/11/19 02/09/20  [provider]  furosemide (LASIX) 40 MG tablet Take 1 tablet (40 mg total) by mouth daily. 07/25/19 02/09/20  Ladona Horns, MD    Physical Exam: Vitals:   10/27/20 1926 10/27/20 2015 10/27/20 2030 10/27/20 2130  BP:  122/73 114/65 119/68  Pulse: 85 81 82 82  Resp: _0 Temp: 98.8 F (37.1 C)     TempSrc: Oral     SpO2: 91% 97% 97% 97%  Weight:      Height:        Constitutional: Lethargic but arousable, oriented x3, no associated distress.   Skin: Papular rash noted over all extremities.   No other rashes or lesions seen.  Somewhat poor skin turgor noted.   Eyes: Pupils are equally reactive to light.  No evidence of scleral icterus or conjunctival pallor.  ENMT: Tongue is notably red with some reddish substance noted on the lower lip.  Mucous membranes are somewhat dry.   Posterior pharynx clear of any exudate or lesions.   Neck: normal, supple, no masses, no thyromegaly.  No evidence of jugular venous distension.   Respiratory: clear to auscultation bilaterally, no wheezing, no crackles. Normal respiratory effort. No accessory muscle use.  Cardiovascular: Regular rate and rhythm, no murmurs / rubs / gallops.  Trace distal bilateral lower extremity pitting edema. 2+ pedal pulses. No carotid bruits.  Chest:   Nontender without crepitus or deformity.   Back:   Nontender without  crepitus or deformity. Abdomen: Abdomen is protuberant but soft and nontender.  No evidence of intra-abdominal masses.  Positive bowel sounds noted in all quadrants.   Musculoskeletal: Treat no joint deformity upper and lower extremities. Good ROM, no contractures. Normal muscle tone.  Neurologic: Patient is lethargic but arousable and oriented x3.  CN 2-12 grossly intact. Sensation intact.  Patient moving all 4 extremities spontaneously.  Patient is following all commands.  Patient is responsive to verbal stimuli.   Psychiatric: Patient exhibits normal mood with flat affect.  Patient seems to possess insight as to their current situation.     Labs on Admission: I have personally reviewed following labs and imaging studies -   CBC: Recent Labs  Lab 10/27/20 1740  WBC 7.8  NEUTROABS 4.9  HGB 10.1*  HCT 31.5*  MCV 111.3*  PLT 253*   Basic Metabolic Panel: Recent Labs  Lab 10/27/20 1740  NA 137  K 4.3  CL 96*  CO2 27  GLUCOSE 104*  BUN 23*  CREATININE 7.10*  CALCIUM 8.0*   GFR: Estimated Creatinine Clearance: 15 mL/min (A) (by C-G formula based on SCr of 7.1 mg/dL (H)). Liver Function  Tests: Recent Labs  Lab 10/27/20 1740  AST 88*  ALT 36  ALKPHOS 67  BILITOT 0.6  PROT 7.8  ALBUMIN 2.5*   Recent Labs  Lab 10/27/20 1925  LIPASE 54*   No results for input(s): AMMONIA in the last 168 hours. Coagulation Profile: Recent Labs  Lab 10/27/20 1740  INR 1.1   Cardiac Enzymes: No results for input(s): CKTOTAL, CKMB, CKMBINDEX, TROPONINI in the last 168 hours. BNP (last 3 results) No results for input(s): PROBNP in the last 8760 hours. HbA1C: No results for input(s): HGBA1C in the last 72 hours. CBG: No results for input(s): GLUCAP in the last 168 hours. Lipid Profile: No results for input(s): CHOL, HDL, LDLCALC, TRIG, CHOLHDL, LDLDIRECT in the last 72 hours. Thyroid Function Tests: No results for input(s): TSH, T4TOTAL, FREET4, T3FREE, THYROIDAB in the last 72 hours. Anemia Panel: No results for input(s): VITAMINB12, FOLATE, FERRITIN, TIBC, IRON, RETICCTPCT in the last 72 hours. Urine analysis:    Component Value Date/Time   COLORURINE YELLOW 07/31/2019 2128   APPEARANCEUR CLEAR 07/31/2019 2128   LABSPEC 1.009 07/31/2019 2128   PHURINE 5.0 07/31/2019 2128   GLUCOSEU NEGATIVE 07/31/2019 2128   HGBUR SMALL (A) 07/31/2019 2128   BILIRUBINUR NEGATIVE 07/31/2019 2128   KETONESUR NEGATIVE 07/31/2019 2128   PROTEINUR 100 (A) 07/31/2019 2128   UROBILINOGEN 0.2 12/31/2009 0746   NITRITE NEGATIVE 07/31/2019 2128   LEUKOCYTESUR NEGATIVE 07/31/2019 2128    Radiological Exams on Admission - Personally Reviewed: CT ABDOMEN PELVIS WO CONTRAST  Result Date: 10/27/2020 CLINICAL DATA:  Altered mental status. Hemodialysis patient. Seizure activity EXAM: CT ABDOMEN AND PELVIS WITHOUT CONTRAST TECHNIQUE: Multidetector CT imaging of the abdomen and pelvis was performed following the standard protocol without IV contrast. COMPARISON:  CT 08/10/2020 FINDINGS: Lower chest: Lung bases are clear. Hepatobiliary: No focal hepatic lesion on noncontrast exam. Small gallstone within  a nondistended gallbladder. Pancreas: Mild haziness about the head of the pancreas (image 31/3). Fluid collections. No pancreatic duct dilatation. Spleen: Normal spleen Adrenals/urinary tract: Adrenal glands and kidneys are normal. The ureters and bladder normal. Stomach/Bowel: Stomach, small-bowel and cecum are normal. The appendix is not identified but there is no pericecal inflammation to suggest appendicitis. The colon and rectosigmoid colon are normal. Vascular/Lymphatic: Abdominal aorta is normal caliber with atherosclerotic calcification. There is no  retroperitoneal or periportal lymphadenopathy. No pelvic lymphadenopathy. Reproductive: Prostate unremarkable Other: No free fluid. Musculoskeletal: No aggressive osseous lesion. IMPRESSION: 1. Mild haziness about the head of the pancreas could represent mild focal pancreatitis. Recommend clinical/laboratory correlation. 2. No bowel inflammation or abnormality. 3.  Aortic Atherosclerosis (ICD10-I70.0). Electronically Signed   By: Suzy Bouchard M.D.   On: 10/27/2020 19:01   CT Head Wo Contrast  Result Date: 10/27/2020 CLINICAL DATA:  Delirium, altered mental status EXAM: CT HEAD WITHOUT CONTRAST TECHNIQUE: Contiguous axial images were obtained from the base of the skull through the vertex without intravenous contrast. COMPARISON:  CT 07/23/2019 FINDINGS: Brain: Stable appearance of remote lacunar type infarcts in the right caudate, left thalamus and basal ganglia. No evidence of acute infarction, hemorrhage, hydrocephalus, extra-axial collection, visible mass lesion or mass effect. Symmetric prominence of the ventricles, cisterns and sulci compatible with parenchymal volume loss. Patchy areas of white matter hypoattenuation are most compatible with chronic microvascular angiopathy. Vascular: Atherosclerotic calcification of the carotid siphons and intradural vertebral arteries. No hyperdense vessel. Skull: No calvarial fracture or suspicious osseous lesion.  No scalp swelling or hematoma. Sinuses/Orbits: Paranasal sinuses and mastoid air cells are predominantly clear. Included orbital structures are unremarkable. Other: None IMPRESSION: No acute intracranial mass abnormality. 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 Background of microvascular angiopathy, parenchymal volume loss and intracranial atherosclerosis. Electronically Signed   By: Lovena Le M.D.   On: 10/27/2020 19:01   DG Chest Portable 1 View  Result Date: 10/27/2020 CLINICAL DATA:  Fever.  Confusion. EXAM: PORTABLE CHEST 1 VIEW COMPARISON:  09/09/2020 FINDINGS: Heart size is enlarged and stable. There has been removal of RIGHT IJ central line. Lungs are free of focal consolidations and pleural effusions. No pulmonary edema. IMPRESSION: Stable cardiomegaly. Electronically Signed   By: Nolon Nations M.D.   On: 10/27/2020 18:15    EKG: Personally reviewed.  Rhythm is normal sinus rhythm with heart rate of 91 bpm.  QTc 477.  No dynamic ST segment changes appreciated.  Assessment/Plan Principal Problem:   Seizure-like activity [facility])  Patient presenting via EMS after reports of seizure-like activity in patient's car from bystanders By the time EMS had responded, patient was no longer exhibiting this activity but was quite confused and lethargic Lethargy continued to persist and gradually resolve to near baseline as patient was being worked up in the emergency department suggestive of a postictal state. CT head reveals no evidence of acute disease although it does reveal evidence of remote lacunar infarcts. No evidence of severe electrolyte derangement based on initial blood work No obvious evidence of acute infection based on initial work-up. Patient denies ongoing cocaine use or alcohol use. EEG ordered for the morning MRI brain without contrast ordered Serum toxicology screen ordered Discussed case with Dr. Lorrin Goodell with neurology  who agrees with plan, their input is appreciated.  No need for antiepileptics at this time unless patient exhibits recurrent seizure activity or there is an abnormality on imaging or EEG. Neurology does recommend the patient abstain from driving for 6 months  Active Problems:   Fever  Initial presentation with a fever without other SIRS criteria or other evidence of infection Considering patient was found in a presumably hot car needle today this may have been secondary to heat exposure Will hold off on any further antibiotics for now, follow blood cultures, monitor for further fevers. Neck is supple and patient is felt to be unlikely to have meningitis.  Anemia  Multifactorial secondary to anemia of chronic disease as well as admission for upper gastrointestinal bleeding in April Patient states that he is no longer taking his PPI, will place patient back on Protonix 40 mg p.o. daily    ESRD on hemodialysis [facility])  Patient reports he typically does hemodialysis Tuesday Thursday and Saturday Patient did receive hemodialysis without complication earlier today We will consult nephrology later in the hospitalization if patient requires resumption of dialysis.    Nicotine dependence, cigarettes, uncomplicated  Counseling patient on cessation   Code Status:  Full code Family Communication: deferred   Status is: Observation  The patient remains OBS appropriate and will d/c before 2 midnights.  Dispo: The patient is from: Home              Anticipated d/c is to: Home              Patient currently is not medically stable to d/c.   Difficult to place patient No        Vernelle Emerald MD [facility] Pager [phone]  If 7PM-7AM, please contact night-coverage [email or web address] Use universal [place] password for that web site. If you do not have the password, please call the hospital operator.  10/27/2020, 11:42 PM

## 2020-10-27 NOTE — Progress Notes (Signed)
Pharmacy Antibiotic Note  Daniel [name] is a 60 y.o. male admitted on 10/27/2020 with sepsis of unknown source.  Pharmacy has been consulted for vancomycin dosing.  Patient found in hot car in a parking lot. Patient is presenting with lethargy and AMS. Patient is ESRD (TuThSa). Last completed session today 7/2 am.  Plan: Vancomycin 2250mg  x 1, then 1000mg  post-HD - Patient has not missed any sessions and SCr and electrolytes appear to be off of baseline. No note from nephrology at this time - will hold off on scheduling next dose at this time. Pharmacy to enter dose pending HD schedule Cefepime 1g q24h  F/u nephrology recs F/u cultures  Height: 6' (182.9 cm) Weight: 122.5 kg (270 lb) IBW/kg (Calculated) : 77.6  No data recorded.  No results for input(s): WBC, CREATININE, LATICACIDVEN, VANCOTROUGH, VANCOPEAK, VANCORANDOM, GENTTROUGH, GENTPEAK, GENTRANDOM, TOBRATROUGH, TOBRAPEAK, TOBRARND, AMIKACINPEAK, AMIKACINTROU, AMIKACIN in the last 168 hours.  CrCl cannot be calculated (Patient's most recent lab result is older than the maximum 21 days allowed.).    Allergies  Allergen Reactions   Dilaudid [Hydromorphone Hcl] Itching and Other (See Comments)    Pt reports itchiness after IM injection     Antimicrobials this admission: Vancomycin 7/2 >> Cefepime 7/2 >>  Metronidazole 7/2 >>  Microbiology results: Pending  Thank you for allowing pharmacy to be a part of this patient's care.  Lorelei Pont, PharmD, BCPS 10/27/2020 8:47 PM ED Clinical Pharmacist -  [phone]

## 2020-10-28 ENCOUNTER — Observation Stay (HOSPITAL_COMMUNITY): Payer: Medicare Other

## 2020-10-28 ENCOUNTER — Observation Stay (HOSPITAL_BASED_OUTPATIENT_CLINIC_OR_DEPARTMENT_OTHER): Payer: Medicare Other

## 2020-10-28 DIAGNOSIS — I10 Essential (primary) hypertension: Secondary | ICD-10-CM | POA: Diagnosis not present

## 2020-10-28 DIAGNOSIS — I361 Nonrheumatic tricuspid (valve) insufficiency: Secondary | ICD-10-CM | POA: Diagnosis not present

## 2020-10-28 DIAGNOSIS — I340 Nonrheumatic mitral (valve) insufficiency: Secondary | ICD-10-CM | POA: Diagnosis not present

## 2020-10-28 DIAGNOSIS — R55 Syncope and collapse: Secondary | ICD-10-CM

## 2020-10-28 DIAGNOSIS — R569 Unspecified convulsions: Secondary | ICD-10-CM

## 2020-10-28 DIAGNOSIS — R4182 Altered mental status, unspecified: Secondary | ICD-10-CM

## 2020-10-28 DIAGNOSIS — R509 Fever, unspecified: Secondary | ICD-10-CM | POA: Diagnosis not present

## 2020-10-28 LAB — ETHANOL: Alcohol, Ethyl (B): <10 mg/dL (ref <10)

## 2020-10-28 LAB — CBC WITH DIFFERENTIAL/PLATELET
Abs Immature Granulocytes: 0.06 10*3/uL (ref 0.00–0.07)
Basophils Absolute: 0 10*3/uL (ref 0.0–0.1)
Basophils Relative: 1 %
Eosinophils Absolute: 0.1 10*3/uL (ref 0.0–0.5)
Eosinophils Relative: 1 %
HCT: 30.6 % — ABNORMAL LOW (ref 39.0–52.0)
Hemoglobin: 9.9 g/dL — ABNORMAL LOW (ref 13.0–17.0)
Immature Granulocytes: 1 %
Lymphocytes Relative: 29 %
Lymphs Abs: 1.9 10*3/uL (ref 0.7–4.0)
MCH: 35.9 pg — ABNORMAL HIGH (ref 26.0–34.0)
MCHC: 32.4 g/dL (ref 30.0–36.0)
MCV: 110.9 fL — ABNORMAL HIGH (ref 80.0–100.0)
Monocytes Absolute: 0.7 10*3/uL (ref 0.1–1.0)
Monocytes Relative: 11 %
Neutro Abs: 3.7 10*3/uL (ref 1.7–7.7)
Neutrophils Relative %: 57 %
Platelets: 114 10*3/uL — ABNORMAL LOW (ref 150–400)
RBC: 2.76 MIL/uL — ABNORMAL LOW (ref 4.22–5.81)
RDW: 18.5 % — ABNORMAL HIGH (ref 11.5–15.5)
WBC: 6.5 10*3/uL (ref 4.0–10.5)
nRBC: 0.5 % — ABNORMAL HIGH (ref 0.0–0.2)

## 2020-10-28 LAB — COMPREHENSIVE METABOLIC PANEL
ALT: 35 U/L (ref 0–44)
AST: 81 U/L — ABNORMAL HIGH (ref 15–41)
Albumin: 2.4 g/dL — ABNORMAL LOW (ref 3.5–5.0)
Alkaline Phosphatase: 61 U/L (ref 38–126)
Anion gap: 13 (ref 5–15)
BUN: 28 mg/dL — ABNORMAL HIGH (ref 6–20)
CO2: 27 mmol/L (ref 22–32)
Calcium: 8.5 mg/dL — ABNORMAL LOW (ref 8.9–10.3)
Chloride: 97 mmol/L — ABNORMAL LOW (ref 98–111)
Creatinine, Ser: 8.19 mg/dL — ABNORMAL HIGH (ref 0.61–1.24)
GFR, Estimated: 7 mL/min — ABNORMAL LOW (ref >60)
Glucose, Bld: 101 mg/dL — ABNORMAL HIGH (ref 70–99)
Potassium: 5.3 mmol/L — ABNORMAL HIGH (ref 3.5–5.1)
Sodium: 137 mmol/L (ref 135–145)
Total Bilirubin: 1.1 mg/dL (ref 0.3–1.2)
Total Protein: 7.6 g/dL (ref 6.5–8.1)

## 2020-10-28 LAB — ECHOCARDIOGRAM COMPLETE
Height: 72 in
S' Lateral: 3.8 cm
Weight: 4320 oz

## 2020-10-28 LAB — VITAMIN B12: Vitamin B-12: 314 pg/mL (ref 180–914)

## 2020-10-28 LAB — FOLATE: Folate: 8.4 ng/mL (ref >5.9)

## 2020-10-28 LAB — PROTIME-INR
INR: 1.1 (ref 0.8–1.2)
Prothrombin Time: 13.9 seconds (ref 11.4–15.2)

## 2020-10-28 LAB — C-REACTIVE PROTEIN: CRP: 2.1 mg/dL — ABNORMAL HIGH (ref <1.0)

## 2020-10-28 LAB — TSH: TSH: 2.73 u[IU]/mL (ref 0.350–4.500)

## 2020-10-28 LAB — CK: Total CK: 66 U/L (ref 49–397)

## 2020-10-28 LAB — PROCALCITONIN: Procalcitonin: 5.85 ng/mL

## 2020-10-28 LAB — TROPONIN I (HIGH SENSITIVITY): Troponin I (High Sensitivity): 12 ng/L (ref <18)

## 2020-10-28 LAB — CBG MONITORING, ED
Glucose-Capillary: 93 mg/dL (ref 70–99)
Glucose-Capillary: 114 mg/dL — ABNORMAL HIGH (ref 70–99)

## 2020-10-28 LAB — MAGNESIUM: Magnesium: 2.1 mg/dL (ref 1.7–2.4)

## 2020-10-28 LAB — GLUCOSE, CAPILLARY: Glucose-Capillary: 139 mg/dL — ABNORMAL HIGH (ref 70–99)

## 2020-10-28 LAB — PHOSPHORUS: Phosphorus: 4.9 mg/dL — ABNORMAL HIGH (ref 2.5–4.6)

## 2020-10-28 LAB — APTT: aPTT: 34 seconds (ref 24–36)

## 2020-10-28 MED ORDER — CLONIDINE HCL 0.2 MG PO TABS
0.2000 mg | ORAL_TABLET | Freq: Two times a day (BID) | ORAL | Status: DC
  Administered 2020-10-28: 0.2 mg via ORAL
  Filled 2020-10-28 (×2): qty 1

## 2020-10-28 MED ORDER — CLONIDINE HCL 0.2 MG PO TABS: ORAL_TABLET | ORAL | 1 refills | Status: DC

## 2020-10-28 MED ORDER — HYDROXYZINE HCL 25 MG PO TABS
25.0000 mg | ORAL_TABLET | Freq: Three times a day (TID) | ORAL | Status: DC | PRN
  Administered 2020-10-28: 25 mg via ORAL
  Filled 2020-10-28: qty 1

## 2020-10-28 MED ORDER — LORAZEPAM 2 MG/ML IJ SOLN: 1.0000 mg | INTRAMUSCULAR | Status: DC

## 2020-10-28 NOTE — Discharge Instructions (Signed)
Do not drive x 6 months, unless cleared by your physician

## 2020-10-28 NOTE — Progress Notes (Signed)
EEG Completed; Results Pending  

## 2020-10-28 NOTE — Procedures (Signed)
Patient Name: Daniel [name]  MRN: [id number]  Epilepsy Attending: Lora Havens  Referring Physician/Provider: Dr Inda Merlin Date: 10/28/2020 Duration: 23.14 mins  Patient history: 60yo M with ams and questionable seizure like activity. EEG to evaluate for seizure.   Level of alertness: Awake  AEDs during EEG study: GBP, Ativan  Technical aspects: This EEG study was done with scalp electrodes positioned according to the 10-20 International system of electrode placement. Electrical activity was acquired at a sampling rate of 500Hz  and reviewed with a high frequency filter of 70Hz  and a low frequency filter of 1Hz . EEG data were recorded continuously and digitally stored.   Description: The posterior dominant rhythm consists of 7Hz  activity of moderate voltage (25-35 uV) seen predominantly in posterior head regions, symmetric and reactive to eye opening and eye closing. EEG showed intermittent generalized polymorphic sharply contoured 3 to 6 Hz theta-delta slowing.  Physiologic photic driving was not seen during photic stimulation.  Hyperventilation was not performed.     ABNORMALITY - Intermittent slow, generalized - Background slow  IMPRESSION: This study is suggestive of mild to moderate diffuse encephalopathy, nonspecific etiology. No seizures or epileptiform discharges were seen throughout the recording.  Dermot Gremillion Barbra Sarks

## 2020-10-28 NOTE — ED Notes (Signed)
ECHO at bedside.

## 2020-10-28 NOTE — Progress Notes (Signed)
Patient ID: Daniel [name], male   DOB: April 17, 1961, 60 y.o.   MRN: [id number]  PROGRESS NOTE    Daniel [name]  CVE:[id number] DOB: [birth year] DOA: 10/27/2020 PCP: Daniel Perna, NP    Brief Narrative:  60 year old male with past medical history of end-stage renal disease on hemodialysis (Tuesday Thursday Saturday), hypertension, diabetes mellitus who presents to the [facility], ED after being found in a car.  Patient reports that he went to dialysis earlier in the day and left without any issues.  He does not recall anything after getting his car to drive home.  He was found in another city in a hot car because a bystander said he was shaking and called EMS.  In the ED he was noted to be febrile to 102.9, was lethargic and seemed possibly postictal but then quickly returned to his normal state of health.  We are asked to admit him to continue the work-up.   Assessment & Plan:   Principal Problem:   Seizure-like activity ([place]) Active Problems:   Essential hypertension   Anemia   ESRD on hemodialysis (HCC)   Duodenal ulcer with hemorrhage   History of alcohol abuse   History of cocaine abuse (HCC)   Nicotine dependence, cigarettes, uncomplicated   Fever  Altered mental status, questionable seizure-like activity Unclear etiology, altered mental status seemed to resolve without much treatment. No evidence of electrolyte abnormalities MRI of the brain shows multiple chronic microhemorrhages in the cerebellum but no acute infarct EEG pending Serum toxicology pending (remote history of cocaine and alcohol use) Have recommended no driving for at least 6 months by neurology  Fever Initial temp to 102.9 thought to be related to heat illness, did receive cefepime x1 in the ED. No evidence of pneumonia Patient does not make urine Blood cultures are pending Procalcitonin at 5, but no other acute evidence of infection with normal WBC count, has chronic kidney disease and the  stress of what ever happened yesterday is noted.  Will trend.  Anemia Multifactorial with history of end-stage renal disease History of upper GI bleed we will resume Protonix  End-stage renal disease on hemodialysis Will consult nephrology if remains hospitalized until Tuesday  Hypertension BP initially low, has certainly climbed.  He is on clonidine at home Resume home antihypertensives  Diabetes Monitor CBGs Sign scale insulin if needed  DVT prophylaxis: SCD/Compression stockings Code Status: Full code  Family Communication: Patient at bedside Disposition Plan: Home   Consultants:  None  Procedures: None  Antimicrobials: Anti-infectives (From admission, onward)    Start     Dose/Rate Route Frequency Ordered Stop   10/28/20 2100  ceFEPIme (MAXIPIME) 1 g in sodium chloride 0.9 % 100 mL IVPB  Status:  Discontinued        1 g 200 mL/hr over 30 Minutes Intravenous Every 24 hours 10/27/20 2048 10/27/20 2341   10/27/20 2048  vancomycin variable dose per unstable renal function (pharmacist dosing)  Status:  Discontinued         Does not apply See admin instructions 10/27/20 2048 10/27/20 2341   10/27/20 1900  vancomycin (VANCOCIN) 2,250 mg in sodium chloride 0.9 % 500 mL IVPB        2,250 mg 250 mL/hr over 120 Minutes Intravenous  Once 10/27/20 1757 10/27/20 2346   10/27/20 1800  ceFEPIme (MAXIPIME) 2 g in sodium chloride 0.9 % 100 mL IVPB        2 g 200 mL/hr over 30 Minutes Intravenous  Once 10/27/20 1757 10/27/20 1917   10/27/20 1800  metroNIDAZOLE (FLAGYL) IVPB 500 mg        500 mg 100 mL/hr over 60 Minutes Intravenous  Once 10/27/20 1757 10/27/20 1916        Subjective: Feels well, wants to go home.  Reports burning pain in his feet is on Neurontin at home.  Objective: Vitals:   10/28/20 0545 10/28/20 0635 10/28/20 0645 10/28/20 0655  BP: (!) 168/77 (!) 169/65 (!) 163/64 (!) 158/86  Pulse: 64 64 64 64  Resp: 13 19 17 16   Temp:      TempSrc:      SpO2: 98%  99% 96% 96%  Weight:      Height:        Intake/Output Summary (Last 24 hours) at 10/28/2020 0940 Last data filed at 10/27/2020 1715 Gross per 24 hour  Intake 400 ml  Output --  Net 400 ml   Filed Weights   10/27/20 1723  Weight: 122.5 kg    Examination:  General exam: Appears calm and comfortable  Respiratory system: Clear to auscultation. Respiratory effort normal. Cardiovascular system: S1 & S2 heard, RRR.  Gastrointestinal system: Abdomen is nondistended, soft and nontender.  Central nervous system: Alert and oriented. No focal neurological deficits. Extremities: Symmetric right greater than left loss of sensation on the feet bilaterally Skin: No rashes Psychiatry: Judgement and insight appear normal. Mood & affect appropriate.     Data Reviewed: I have personally reviewed following labs and imaging studies  CBC: Recent Labs  Lab 10/27/20 1740 10/28/20 0623  WBC 7.8 6.5  NEUTROABS 4.9 3.7  HGB 10.1* 9.9*  HCT 31.5* 30.6*  MCV 111.3* 110.9*  PLT 117* 606*   Basic Metabolic Panel: Recent Labs  Lab 10/27/20 1740  NA 137  K 4.3  CL 96*  CO2 27  GLUCOSE 104*  BUN 23*  CREATININE 7.10*  CALCIUM 8.0*   GFR: Estimated Creatinine Clearance: 15 mL/min (A) (by C-G formula based on SCr of 7.1 mg/dL (H)). Liver Function Tests: Recent Labs  Lab 10/27/20 1740  AST 88*  ALT 36  ALKPHOS 67  BILITOT 0.6  PROT 7.8  ALBUMIN 2.5*   Recent Labs  Lab 10/27/20 1925  LIPASE 54*    Coagulation Profile: Recent Labs  Lab 10/27/20 1740 10/28/20 0623  INR 1.1 1.1   Cardiac Enzymes: Recent Labs  Lab 10/28/20 0623  CKTOTAL 66    CBG: Recent Labs  Lab 10/28/20 0750  GLUCAP 93   Thyroid Function Tests: Recent Labs    10/28/20 0623  TSH 2.730   Anemia Panel: Recent Labs    10/28/20 0623  FOLATE 8.4   Sepsis Labs: Recent Labs  Lab 10/27/20 1740 10/28/20 0623  PROCALCITON  --  5.85  LATICACIDVEN 1.4  --     Recent Results (from the past  240 hour(s))  Resp Panel by RT-PCR (Flu A&B, Covid) Nasopharyngeal Swab     Status: None   Collection Time: 10/27/20  5:45 PM   Specimen: Nasopharyngeal Swab; Nasopharyngeal(NP) swabs in vial transport medium  Result Value Ref Range Status   SARS Coronavirus 2 by RT PCR NEGATIVE NEGATIVE Final    Comment: (NOTE) SARS-CoV-2 target nucleic acids are NOT DETECTED.  The SARS-CoV-2 RNA is generally detectable in upper respiratory specimens during the acute phase of infection. The lowest concentration of SARS-CoV-2 viral copies this assay can detect is 138 copies/mL. A negative result does not preclude SARS-Cov-2 infection and should not be  used as the sole basis for treatment or other patient management decisions. A negative result may occur with  improper specimen collection/handling, submission of specimen other than nasopharyngeal swab, presence of viral mutation(s) within the areas targeted by this assay, and inadequate number of viral copies(<138 copies/mL). A negative result must be combined with clinical observations, patient history, and epidemiological information. The expected result is Negative.  Fact Sheet for Patients:  EntrepreneurPulse.com.au  Fact Sheet for Healthcare Providers:  IncredibleEmployment.be  This test is no t yet approved or cleared by the [place] FDA and  has been authorized for detection and/or diagnosis of SARS-CoV-2 by FDA under an Emergency Use Authorization (EUA). This EUA will remain  in effect (meaning this test can be used) for the duration of the COVID-19 declaration under Section 564(b)(1) of the Act, 21 U.S.C.section 360bbb-3(b)(1), unless the authorization is terminated  or revoked sooner.       Influenza A by PCR NEGATIVE NEGATIVE Final   Influenza B by PCR NEGATIVE NEGATIVE Final    Comment: (NOTE) The Xpert Xpress SARS-CoV-2/FLU/RSV plus assay is intended as an aid in the diagnosis of influenza  from Nasopharyngeal swab specimens and should not be used as a sole basis for treatment. Nasal washings and aspirates are unacceptable for Xpert Xpress SARS-CoV-2/FLU/RSV testing.  Fact Sheet for Patients: EntrepreneurPulse.com.au  Fact Sheet for Healthcare Providers: IncredibleEmployment.be  This test is not yet approved or cleared by the [place] FDA and has been authorized for detection and/or diagnosis of SARS-CoV-2 by FDA under an Emergency Use Authorization (EUA). This EUA will remain in effect (meaning this test can be used) for the duration of the COVID-19 declaration under Section 564(b)(1) of the Act, 21 U.S.C. section 360bbb-3(b)(1), unless the authorization is terminated or revoked.  Performed at [facility], [place] [address], [place], [place] [address]       Radiology Studies: CT ABDOMEN PELVIS WO CONTRAST  Result Date: 10/27/2020 CLINICAL DATA:  Altered mental status. Hemodialysis patient. Seizure activity EXAM: CT ABDOMEN AND PELVIS WITHOUT CONTRAST TECHNIQUE: Multidetector CT imaging of the abdomen and pelvis was performed following the standard protocol without IV contrast. COMPARISON:  CT 08/10/2020 FINDINGS: Lower chest: Lung bases are clear. Hepatobiliary: No focal hepatic lesion on noncontrast exam. Small gallstone within a nondistended gallbladder. Pancreas: Mild haziness about the head of the pancreas (image 31/3). Fluid collections. No pancreatic duct dilatation. Spleen: Normal spleen Adrenals/urinary tract: Adrenal glands and kidneys are normal. The ureters and bladder normal. Stomach/Bowel: Stomach, small-bowel and cecum are normal. The appendix is not identified but there is no pericecal inflammation to suggest appendicitis. The colon and rectosigmoid colon are normal. Vascular/Lymphatic: Abdominal aorta is normal caliber with atherosclerotic calcification. There is no retroperitoneal or periportal lymphadenopathy. No  pelvic lymphadenopathy. Reproductive: Prostate unremarkable Other: No free fluid. Musculoskeletal: No aggressive osseous lesion. IMPRESSION: 1. Mild haziness about the head of the pancreas could represent mild focal pancreatitis. Recommend clinical/laboratory correlation. 2. No bowel inflammation or abnormality. 3.  Aortic Atherosclerosis (ICD10-I70.0). Electronically Signed   By: Suzy Bouchard M.D.   On: 10/27/2020 19:01   CT Head Wo Contrast  Result Date: 10/27/2020 CLINICAL DATA:  Delirium, altered mental status EXAM: CT HEAD WITHOUT CONTRAST TECHNIQUE: Contiguous axial images were obtained from the base of the skull through the vertex without intravenous contrast. COMPARISON:  CT 07/23/2019 FINDINGS: Brain: Stable appearance of remote lacunar type infarcts in the right caudate, left thalamus and basal ganglia. No evidence of acute infarction, hemorrhage, hydrocephalus,  extra-axial collection, visible mass lesion or mass effect. Symmetric prominence of the ventricles, cisterns and sulci compatible with parenchymal volume loss. Patchy areas of white matter hypoattenuation are most compatible with chronic microvascular angiopathy. Vascular: Atherosclerotic calcification of the carotid siphons and intradural vertebral arteries. No hyperdense vessel. Skull: No calvarial fracture or suspicious osseous lesion. No scalp swelling or hematoma. Sinuses/Orbits: Paranasal sinuses and mastoid air cells are predominantly clear. Included orbital structures are unremarkable. Other: None IMPRESSION: No acute intracranial mass abnormality. 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 Background of microvascular angiopathy, parenchymal volume loss and intracranial atherosclerosis. Electronically Signed   By: Lovena Le M.D.   On: 10/27/2020 19:01   MR BRAIN WO CONTRAST  Result Date: 10/28/2020 CLINICAL DATA:  Seizure EXAM: MRI HEAD WITHOUT CONTRAST TECHNIQUE: Multiplanar,  multiecho pulse sequences of the brain and surrounding structures were obtained without intravenous contrast. COMPARISON:  None. FINDINGS: Brain: Multiple chronic microhemorrhages of the cerebellum. Hemosiderin deposition the left thalamus and within the posterior left parietal lobe. No acute infarct. Normal white matter signal. Generalized volume loss without a clear lobar predilection. The midline structures are normal. Vascular: Major flow voids are preserved. Skull and upper cervical spine: Normal calvarium and skull base. Visualized upper cervical spine and soft tissues are normal. Sinuses/Orbits:No paranasal sinus fluid levels or advanced mucosal thickening. No mastoid or middle ear effusion. Normal orbits. IMPRESSION: 1. No acute intracranial abnormality. 2. Multiple chronic microhemorrhages of the cerebellum. 3. Generalized volume loss without a clear lobar predilection. Electronically Signed   By: Ulyses Jarred M.D.   On: 10/28/2020 02:40   DG Chest Portable 1 View  Result Date: 10/27/2020 CLINICAL DATA:  Fever.  Confusion. EXAM: PORTABLE CHEST 1 VIEW COMPARISON:  09/09/2020 FINDINGS: Heart size is enlarged and stable. There has been removal of RIGHT IJ central line. Lungs are free of focal consolidations and pleural effusions. No pulmonary edema. IMPRESSION: Stable cardiomegaly. Electronically Signed   By: Nolon Nations M.D.   On: 10/27/2020 18:15     Scheduled Meds:  calcium acetate  1,334 mg Oral TID AC & HS   gabapentin  100 mg Oral QHS   insulin aspart  0-9 Units Subcutaneous TID AC & HS   LORazepam  1 mg Intravenous On Call   pantoprazole  40 mg Oral Daily   triamcinolone cream  1 application Topical BID   Continuous Infusions:   LOS: 0 days    Donnamae Jude, MD 10/28/2020 9:40 AM [phone] [facility] If 7PM-7AM, please contact night-coverage 10/28/2020, 9:40 AM

## 2020-10-28 NOTE — Discharge Summary (Signed)
Physician Discharge Summary  Daniel [name] QQP:[id number] DOB: 12-13-1960 DOA: 10/27/2020  PCP: [name], NP  Admit date: 10/27/2020 Discharge date: 10/28/2020  Admitted From: Home Disposition:  home  Recommendations for Outpatient Follow-up:  Follow up with PCP in 1-2 weeks Please obtain BMP/CBC in one week Please follow up on the following pending results:  Home Health: No Equipment/Devices:None   Discharge Condition: Fair CODE STATUS: Full code Diet recommendation: renal, carb modified  Brief/Interim Summary: 60 year old male with past medical history of end-stage renal disease on hemodialysis (Tuesday Thursday Saturday), hypertension, diabetes mellitus who presents to the [facility], ED after being found in a car.  Patient reports that he went to dialysis earlier in the day and left without any issues.  He does not recall anything after getting his car to drive home.  He was found in another city in a hot car because a bystander said he was shaking and called EMS.  In the ED he was noted to be febrile to 102.9, was lethargic and seemed possibly postictal but then quickly returned to his normal state of health.  We are asked to admit him to continue the work-up. Work-up non-diagnostic, consider toxicology, pending at time of DC. Patient insistent on going home.  Discharge Diagnoses:  Principal Problem:   Seizure-like activity ([place]) Active Problems:   Essential hypertension   Anemia   ESRD on hemodialysis (HCC)   Duodenal ulcer with hemorrhage   History of alcohol abuse   History of cocaine abuse (HCC)   Nicotine dependence, cigarettes, uncomplicated   Fever  Altered mental status, questionable seizure-like activity Unclear etiology, altered mental status seemed to resolve without much treatment. No evidence of electrolyte abnormalities MRI of the brain shows multiple chronic microhemorrhages in the cerebellum but no acute infarct EEG pending Serum toxicology  pending (remote history of cocaine and alcohol use) Have recommended no driving for at least 6 months by neurology   Fever Initial temp to 102.9 thought to be related to heat illness, did receive cefepime x1 in the ED. No evidence of pneumonia Patient does not make urine Blood cultures are pending Procalcitonin at 5, but no other acute evidence of infection with normal WBC count, has chronic kidney disease and the stress of what ever happened yesterday is noted.  Will trend.  Anemia Multifactorial with history of end-stage renal disease History of upper GI bleed we will resume Protonix  End-stage renal disease on hemodialysis Will consult nephrology if remains hospitalized until Tuesday  Hypertension BP initially low, has certainly climbed.  He is on clonidine at home Resume home antihypertensives  Diabetes Monitor CBGs Sliding scale insulin if needed  Discharge Instructions:  Discharge Instructions     Call MD for:  difficulty breathing, headache or visual disturbances   Complete by: As directed    Call MD for:  persistant dizziness or light-headedness   Complete by: As directed    Call MD for:  persistant nausea and vomiting   Complete by: As directed    Call MD for:  severe uncontrolled pain   Complete by: As directed    Call MD for:  temperature >100.4   Complete by: As directed    Diet - low sodium heart healthy   Complete by: As directed    Driving Restrictions   Complete by: As directed    None x 6 months unless cleared by your doctor   Increase activity slowly   Complete by: As directed  Allergies as of 10/28/2020       Reactions   Dilaudid [hydromorphone Hcl] Itching, Other (See Comments)   Pt reports itchiness after IM injection         Medication List     TAKE these medications    blood glucose meter kit and supplies Kit Dispense based on patient and insurance preference. Use up to four times daily as directed. (FOR ICD-9 250.00, 250.01).    Calcium Acetate 667 MG Tabs Take 1,334 mg by mouth 4 (four) times daily.   cetirizine 10 MG tablet Commonly known as: ZyrTEC Allergy Take 1 tablet (10 mg total) by mouth daily as needed for allergies.   cloNIDine 0.2 MG tablet Commonly known as: CATAPRES Take  1 tablet twice daily . What changed:  how much to take how to take this when to take this additional instructions   gabapentin 100 MG capsule Commonly known as: NEURONTIN Take 1 capsule (100 mg total) by mouth at bedtime.   hydrOXYzine 25 MG tablet Commonly known as: ATARAX/VISTARIL Take 1 tablet (25 mg total) by mouth every 8 (eight) hours as needed for anxiety or itching.   pantoprazole 40 MG tablet Commonly known as: PROTONIX Take 1 tablet (40 mg total) by mouth 2 (two) times daily.   triamcinolone cream 0.1 % Commonly known as: KENALOG Apply 1 application topically 2 (two) times daily.        Follow-up Information     [name], NP Follow up in 1 week(s).   Specialty: Internal Medicine Why: Hospital follow-up Contact information: [address] [place] [address] [phone]                Allergies  Allergen Reactions   Dilaudid [Hydromorphone Hcl] Itching and Other (See Comments)    Pt reports itchiness after IM injection     Consultations: Neurology, curbside   Procedures/Studies: CT ABDOMEN PELVIS WO CONTRAST  Result Date: 10/27/2020 CLINICAL DATA:  Altered mental status. Hemodialysis patient. Seizure activity EXAM: CT ABDOMEN AND PELVIS WITHOUT CONTRAST TECHNIQUE: Multidetector CT imaging of the abdomen and pelvis was performed following the standard protocol without IV contrast. COMPARISON:  CT 08/10/2020 FINDINGS: Lower chest: Lung bases are clear. Hepatobiliary: No focal hepatic lesion on noncontrast exam. Small gallstone within a nondistended gallbladder. Pancreas: Mild haziness about the head of the pancreas (image 31/3). Fluid collections. No pancreatic duct  dilatation. Spleen: Normal spleen Adrenals/urinary tract: Adrenal glands and kidneys are normal. The ureters and bladder normal. Stomach/Bowel: Stomach, small-bowel and cecum are normal. The appendix is not identified but there is no pericecal inflammation to suggest appendicitis. The colon and rectosigmoid colon are normal. Vascular/Lymphatic: Abdominal aorta is normal caliber with atherosclerotic calcification. There is no retroperitoneal or periportal lymphadenopathy. No pelvic lymphadenopathy. Reproductive: Prostate unremarkable Other: No free fluid. Musculoskeletal: No aggressive osseous lesion. IMPRESSION: 1. Mild haziness about the head of the pancreas could represent mild focal pancreatitis. Recommend clinical/laboratory correlation. 2. No bowel inflammation or abnormality. 3.  Aortic Atherosclerosis (ICD10-I70.0). Electronically Signed   By: Suzy Bouchard M.D.   On: 10/27/2020 19:01   CT Head Wo Contrast  Result Date: 10/27/2020 CLINICAL DATA:  Delirium, altered mental status EXAM: CT HEAD WITHOUT CONTRAST TECHNIQUE: Contiguous axial images were obtained from the base of the skull through the vertex without intravenous contrast. COMPARISON:  CT 07/23/2019 FINDINGS: Brain: Stable appearance of remote lacunar type infarcts in the right caudate, left thalamus and basal ganglia. No evidence of acute infarction, hemorrhage, hydrocephalus, extra-axial collection,  visible mass lesion or mass effect. Symmetric prominence of the ventricles, cisterns and sulci compatible with parenchymal volume loss. Patchy areas of white matter hypoattenuation are most compatible with chronic microvascular angiopathy. Vascular: Atherosclerotic calcification of the carotid siphons and intradural vertebral arteries. No hyperdense vessel. Skull: No calvarial fracture or suspicious osseous lesion. No scalp swelling or hematoma. Sinuses/Orbits: Paranasal sinuses and mastoid air cells are predominantly clear. Included orbital  structures are unremarkable. Other: None IMPRESSION: No acute intracranial mass abnormality. 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 Background of microvascular angiopathy, parenchymal volume loss and intracranial atherosclerosis. Electronically Signed   By: Lovena Le M.D.   On: 10/27/2020 19:01   MR BRAIN WO CONTRAST  Result Date: 10/28/2020 CLINICAL DATA:  Seizure EXAM: MRI HEAD WITHOUT CONTRAST TECHNIQUE: Multiplanar, multiecho pulse sequences of the brain and surrounding structures were obtained without intravenous contrast. COMPARISON:  None. FINDINGS: Brain: Multiple chronic microhemorrhages of the cerebellum. Hemosiderin deposition the left thalamus and within the posterior left parietal lobe. No acute infarct. Normal white matter signal. Generalized volume loss without a clear lobar predilection. The midline structures are normal. Vascular: Major flow voids are preserved. Skull and upper cervical spine: Normal calvarium and skull base. Visualized upper cervical spine and soft tissues are normal. Sinuses/Orbits:No paranasal sinus fluid levels or advanced mucosal thickening. No mastoid or middle ear effusion. Normal orbits. IMPRESSION: 1. No acute intracranial abnormality. 2. Multiple chronic microhemorrhages of the cerebellum. 3. Generalized volume loss without a clear lobar predilection. Electronically Signed   By: Ulyses Jarred M.D.   On: 10/28/2020 02:40   DG Chest Portable 1 View  Result Date: 10/27/2020 CLINICAL DATA:  Fever.  Confusion. EXAM: PORTABLE CHEST 1 VIEW COMPARISON:  09/09/2020 FINDINGS: Heart size is enlarged and stable. There has been removal of RIGHT IJ central line. Lungs are free of focal consolidations and pleural effusions. No pulmonary edema. IMPRESSION: Stable cardiomegaly. Electronically Signed   By: Nolon Nations M.D.   On: 10/27/2020 18:15   EEG adult  Result Date: 10/28/2020 Lora Havens, MD     10/28/2020   5:03 PM Patient Name: Daniel [name] MRN: [id number] Epilepsy Attending: Lora Havens Referring Physician/Provider: Dr Inda Merlin Date: 10/28/2020 Duration: 23.14 mins Patient history: 60yo M with ams and questionable seizure like activity. EEG to evaluate for seizure. Level of alertness: Awake AEDs during EEG study: GBP, Ativan Technical aspects: This EEG study was done with scalp electrodes positioned according to the 10-20 International system of electrode placement. Electrical activity was acquired at a sampling rate of _0  and reviewed with a high frequency filter of _1  and a low frequency filter of _2 . EEG data were recorded continuously and digitally stored. Description: The posterior dominant rhythm consists of _3  activity of moderate voltage (25-35 uV) seen predominantly in posterior head regions, symmetric and reactive to eye opening and eye closing. EEG showed intermittent generalized polymorphic sharply contoured 3 to 6 Hz theta-delta slowing.  Physiologic photic driving was not seen during photic stimulation.  Hyperventilation was not performed.   ABNORMALITY - Intermittent slow, generalized - Background slow IMPRESSION: This study is suggestive of mild to moderate diffuse encephalopathy, nonspecific etiology. No seizures or epileptiform discharges were seen throughout the recording. Lora Havens   ECHOCARDIOGRAM COMPLETE  Result Date: 10/28/2020    ECHOCARDIOGRAM REPORT   Patient Name:   Daniel [name] Date of Exam: 10/28/2020 Medical Rec #:  [id number]  Height:       72.0 in Accession #:    [id number]         Weight:       270.0 lb Date of Birth:  [birth year]          BSA:          2.420 m Patient Age:    1 years           BP:           158/86 mmHg Patient Gender: M                  HR:           68 bpm. Exam Location:  Inpatient Procedure: 2D Echo Indications:    syncope  History:        Patient has prior history of Echocardiogram examinations, most                  recent 01/23/2019. End stage renal disease, Signs/Symptoms:Fever;                 Risk Factors:Diabetes, Current Smoker and Hypertension.  Sonographer:    Johny Chess Referring Phys: [id number] [place]  1. Left ventricular ejection fraction, by estimation, is 60 to 65%. The left ventricle has normal function. The left ventricle has no regional wall motion abnormalities. The left ventricular internal cavity size was mildly dilated. There is moderate left ventricular hypertrophy. Left ventricular diastolic parameters are indeterminate.  2. Right ventricular systolic function is normal. The right ventricular size is normal. There is normal pulmonary artery systolic pressure.  3. Left atrial size was mildly dilated.  4. The mitral valve is normal in structure. Mild mitral valve regurgitation. No evidence of mitral stenosis.  5. The aortic valve is tricuspid. Aortic valve regurgitation is not visualized. Mild aortic valve sclerosis is present, with no evidence of aortic valve stenosis.  6. The inferior vena cava is normal in size with greater than 50% respiratory variability, suggesting right atrial pressure of 3 mmHg. FINDINGS  Left Ventricle: Left ventricular ejection fraction, by estimation, is 60 to 65%. The left ventricle has normal function. The left ventricle has no regional wall motion abnormalities. The left ventricular internal cavity size was mildly dilated. There is  moderate left ventricular hypertrophy. Left ventricular diastolic parameters are indeterminate. Right Ventricle: The right ventricular size is normal.Right ventricular systolic function is normal. There is normal pulmonary artery systolic pressure. The tricuspid regurgitant velocity is 2.59 m/s, and with an assumed right atrial pressure of 3 mmHg, the estimated right ventricular systolic pressure is 99.3 mmHg. Left Atrium: Left atrial size was mildly dilated. Right Atrium: Right atrial size was normal in size. Pericardium:  There is no evidence of pericardial effusion. Mitral Valve: The mitral valve is normal in structure. Mild mitral valve regurgitation. No evidence of mitral valve stenosis. Tricuspid Valve: The tricuspid valve is normal in structure. Tricuspid valve regurgitation is mild . No evidence of tricuspid stenosis. Aortic Valve: The aortic valve is tricuspid. Aortic valve regurgitation is not visualized. Mild aortic valve sclerosis is present, with no evidence of aortic valve stenosis. Pulmonic Valve: The pulmonic valve was normal in structure. Pulmonic valve regurgitation is not visualized. No evidence of pulmonic stenosis. Aorta: The aortic root is normal in size and structure. Venous: The inferior vena cava is normal in size with greater than 50% respiratory variability, suggesting right atrial pressure of 3 mmHg. IAS/Shunts: No atrial level shunt detected  by color flow Doppler.  LEFT VENTRICLE PLAX 2D LVIDd:         5.50 cm  Diastology LVIDs:         3.80 cm  LV e' medial:  7.18 cm/s LV PW:         1.40 cm  LV e' lateral: 9.57 cm/s LV IVS:        1.40 cm LVOT diam:     2.00 cm LV SV:         69 LV SV Index:   28 LVOT Area:     3.14 cm  RIGHT VENTRICLE             IVC RV S prime:     14.10 cm/s  IVC diam: 1.90 cm TAPSE (M-mode): 2.6 cm LEFT ATRIUM              Index       RIGHT ATRIUM           Index LA diam:        4.40 cm  1.82 cm/m  RA Area:     15.90 cm LA Vol (A2C):   84.6 ml  34.95 ml/m RA Volume:   38.50 ml  15.91 ml/m LA Vol (A4C):   106.0 ml 43.79 ml/m LA Biplane Vol: 103.0 ml 42.56 ml/m  AORTIC VALVE LVOT Vmax:   96.00 cm/s LVOT Vmean:  64.300 cm/s LVOT VTI:    0.219 m  AORTA Ao Root diam: 3.20 cm Ao Asc diam:  3.30 cm TRICUSPID VALVE TR Peak grad:   26.8 mmHg TR Vmax:        259.00 cm/s  SHUNTS Systemic VTI:  0.22 m Systemic Diam: 2.00 cm Kirk Ruths MD Electronically signed by Kirk Ruths MD Signature Date/Time: 10/28/2020/11:51:03 AM    Final       Subjective: Feels much better. Insistent he  go home. Wants to partake in 4th of July festivities.  Discharge Exam: Vitals:   10/28/20 1336 10/28/20 1803  BP: (!) 184/95 (!) 164/94  Pulse: 67 71  Resp: 18 18  Temp: 97.9 F (36.6 C) 97.7 F (36.5 C)  SpO2: 98% 95%   Vitals:   10/28/20 0900 10/28/20 1054 10/28/20 1336 10/28/20 1803  BP: (!) 186/77 (!) 145/62 (!) 184/95 (!) 164/94  Pulse: 66  67 71  Resp: _0 Temp:   97.9 F (36.6 C) 97.7 F (36.5 C)  TempSrc:   Oral Oral  SpO2: 98%  98% 95%  Weight:      Height:        General: Pt is alert, awake, not in acute distress Cardiovascular: RRR, S1/S2 +, no rubs, no gallops Respiratory: CTA bilaterally, no wheezing, no rhonchi Abdominal: Soft, NT, ND, bowel sounds + Extremities: no edema, no cyanosis    The results of significant diagnostics from this hospitalization (including imaging, microbiology, ancillary and laboratory) are listed below for reference.     Microbiology: Recent Results (from the past 240 hour(s))  Culture, blood (Routine x 2)     Status: None (Preliminary result)   Collection Time: 10/27/20  5:40 PM   Specimen: BLOOD RIGHT FOREARM  Result Value Ref Range Status   Specimen Description BLOOD RIGHT FOREARM  Final   Special Requests   Final    BOTTLES DRAWN AEROBIC AND ANAEROBIC Blood Culture adequate volume   Culture   Final    NO GROWTH < 12 HOURS Performed at [facility], [place]  [address] [place], [place] [address]    Report Status PENDING  Incomplete  Resp Panel by RT-PCR (Flu A&B, Covid) Nasopharyngeal Swab     Status: None   Collection Time: 10/27/20  5:45 PM   Specimen: Nasopharyngeal Swab; Nasopharyngeal(NP) swabs in vial transport medium  Result Value Ref Range Status   SARS Coronavirus 2 by RT PCR NEGATIVE NEGATIVE Final    Comment: (NOTE) SARS-CoV-2 target nucleic acids are NOT DETECTED.  The SARS-CoV-2 RNA is generally detectable in upper respiratory specimens during the acute phase of infection. The  lowest concentration of SARS-CoV-2 viral copies this assay can detect is 138 copies/mL. A negative result does not preclude SARS-Cov-2 infection and should not be used as the sole basis for treatment or other patient management decisions. A negative result may occur with  improper specimen collection/handling, submission of specimen other than nasopharyngeal swab, presence of viral mutation(s) within the areas targeted by this assay, and inadequate number of viral copies(<138 copies/mL). A negative result must be combined with clinical observations, patient history, and epidemiological information. The expected result is Negative.  Fact Sheet for Patients:  EntrepreneurPulse.com.au  Fact Sheet for Healthcare Providers:  IncredibleEmployment.be  This test is no t yet approved or cleared by the [place] FDA and  has been authorized for detection and/or diagnosis of SARS-CoV-2 by FDA under an Emergency Use Authorization (EUA). This EUA will remain  in effect (meaning this test can be used) for the duration of the COVID-19 declaration under Section 564(b)(1) of the Act, 21 U.S.C.section 360bbb-3(b)(1), unless the authorization is terminated  or revoked sooner.       Influenza A by PCR NEGATIVE NEGATIVE Final   Influenza B by PCR NEGATIVE NEGATIVE Final    Comment: (NOTE) The Xpert Xpress SARS-CoV-2/FLU/RSV plus assay is intended as an aid in the diagnosis of influenza from Nasopharyngeal swab specimens and should not be used as a sole basis for treatment. Nasal washings and aspirates are unacceptable for Xpert Xpress SARS-CoV-2/FLU/RSV testing.  Fact Sheet for Patients: EntrepreneurPulse.com.au  Fact Sheet for Healthcare Providers: IncredibleEmployment.be  This test is not yet approved or cleared by the [place] FDA and has been authorized for detection and/or diagnosis of SARS-CoV-2 by FDA under  an Emergency Use Authorization (EUA). This EUA will remain in effect (meaning this test can be used) for the duration of the COVID-19 declaration under Section 564(b)(1) of the Act, 21 U.S.C. section 360bbb-3(b)(1), unless the authorization is terminated or revoked.  Performed at [facility], [place] [address] [place], [place] [address]      Labs: BNP (last 3 results) Recent Labs    09/09/20 1116  BNP 768.1*   Basic Metabolic Panel: Recent Labs  Lab 10/27/20 1740 10/28/20 0623  NA 137 137  K 4.3 5.3*  CL 96* 97*  CO2 27 27  GLUCOSE 104* 101*  BUN 23* 28*  CREATININE 7.10* 8.19*  CALCIUM 8.0* 8.5*  MG  --  2.1  PHOS  --  4.9*   Liver Function Tests: Recent Labs  Lab 10/27/20 1740 10/28/20 0623  AST 88* 81*  ALT 36 35  ALKPHOS 67 61  BILITOT 0.6 1.1  PROT 7.8 7.6  ALBUMIN 2.5* 2.4*   Recent Labs  Lab 10/27/20 1925  LIPASE 54*    CBC: Recent Labs  Lab 10/27/20 1740 10/28/20 0623  WBC 7.8 6.5  NEUTROABS 4.9 3.7  HGB 10.1* 9.9*  HCT 31.5* 30.6*  MCV 111.3* 110.9*  PLT 117* 114*  Cardiac Enzymes: Recent Labs  Lab 10/28/20 0623  CKTOTAL 66   BNP: Invalid input(s): POCBNP CBG: Recent Labs  Lab 10/28/20 0750 10/28/20 1144 10/28/20 1628  GLUCAP 93 114* 139*    Thyroid function studies Recent Labs    10/28/20 0623  TSH 2.730   Anemia work up Recent Labs    10/28/20 0623  VITAMINB12 314  FOLATE 8.4   Urinalysis    Component Value Date/Time   COLORURINE YELLOW 07/31/2019 2128   APPEARANCEUR CLEAR 07/31/2019 2128   LABSPEC 1.009 07/31/2019 2128   PHURINE 5.0 07/31/2019 2128   GLUCOSEU NEGATIVE 07/31/2019 2128   HGBUR SMALL (A) 07/31/2019 2128   BILIRUBINUR NEGATIVE 07/31/2019 2128   KETONESUR NEGATIVE 07/31/2019 2128   PROTEINUR 100 (A) 07/31/2019 2128   UROBILINOGEN 0.2 12/31/2009 0746   NITRITE NEGATIVE 07/31/2019 2128   LEUKOCYTESUR NEGATIVE 07/31/2019 2128   Sepsis Labs Invalid input(s): PROCALCITONIN,  WBC,   LACTICIDVEN Microbiology Recent Results (from the past 240 hour(s))  Culture, blood (Routine x 2)     Status: None (Preliminary result)   Collection Time: 10/27/20  5:40 PM   Specimen: BLOOD RIGHT FOREARM  Result Value Ref Range Status   Specimen Description BLOOD RIGHT FOREARM  Final   Special Requests   Final    BOTTLES DRAWN AEROBIC AND ANAEROBIC Blood Culture adequate volume   Culture   Final    NO GROWTH < 12 HOURS Performed at [facility], [place] [address] [place], [place] [address]    Report Status PENDING  Incomplete  Resp Panel by RT-PCR (Flu A&B, Covid) Nasopharyngeal Swab     Status: None   Collection Time: 10/27/20  5:45 PM   Specimen: Nasopharyngeal Swab; Nasopharyngeal(NP) swabs in vial transport medium  Result Value Ref Range Status   SARS Coronavirus 2 by RT PCR NEGATIVE NEGATIVE Final    Comment: (NOTE) SARS-CoV-2 target nucleic acids are NOT DETECTED.  The SARS-CoV-2 RNA is generally detectable in upper respiratory specimens during the acute phase of infection. The lowest concentration of SARS-CoV-2 viral copies this assay can detect is 138 copies/mL. A negative result does not preclude SARS-Cov-2 infection and should not be used as the sole basis for treatment or other patient management decisions. A negative result may occur with  improper specimen collection/handling, submission of specimen other than nasopharyngeal swab, presence of viral mutation(s) within the areas targeted by this assay, and inadequate number of viral copies(<138 copies/mL). A negative result must be combined with clinical observations, patient history, and epidemiological information. The expected result is Negative.  Fact Sheet for Patients:  EntrepreneurPulse.com.au  Fact Sheet for Healthcare Providers:  IncredibleEmployment.be  This test is no t yet approved or cleared by the [place] FDA and  has been authorized for detection and/or  diagnosis of SARS-CoV-2 by FDA under an Emergency Use Authorization (EUA). This EUA will remain  in effect (meaning this test can be used) for the duration of the COVID-19 declaration under Section 564(b)(1) of the Act, 21 U.S.C.section 360bbb-3(b)(1), unless the authorization is terminated  or revoked sooner.       Influenza A by PCR NEGATIVE NEGATIVE Final   Influenza B by PCR NEGATIVE NEGATIVE Final    Comment: (NOTE) The Xpert Xpress SARS-CoV-2/FLU/RSV plus assay is intended as an aid in the diagnosis of influenza from Nasopharyngeal swab specimens and should not be used as a sole basis for treatment. Nasal washings and aspirates are unacceptable for Xpert Xpress SARS-CoV-2/FLU/RSV testing.  Fact Sheet  for Patients: EntrepreneurPulse.com.au  Fact Sheet for Healthcare Providers: IncredibleEmployment.be  This test is not yet approved or cleared by the [place] FDA and has been authorized for detection and/or diagnosis of SARS-CoV-2 by FDA under an Emergency Use Authorization (EUA). This EUA will remain in effect (meaning this test can be used) for the duration of the COVID-19 declaration under Section 564(b)(1) of the Act, 21 U.S.C. section 360bbb-3(b)(1), unless the authorization is terminated or revoked.  Performed at [facility], [place] [address], [place], [place] [address]      Time coordinating discharge: Over 30 minutes  SIGNED:   Donnamae Jude, MD  [facility] 10/28/2020, 6:39 PM  If 7PM-7AM, please contact night-coverage

## 2020-10-28 NOTE — ED Notes (Signed)
Ordered Breakfast 

## 2020-10-28 NOTE — Progress Notes (Signed)
  Echocardiogram 2D Echocardiogram has been performed.  Daniel [name] 10/28/2020, 10:49 AM

## 2020-10-28 NOTE — ED Notes (Signed)
Pt was able to stand and walk to the restroom

## 2020-10-29 ENCOUNTER — Other Ambulatory Visit (INDEPENDENT_AMBULATORY_CARE_PROVIDER_SITE_OTHER): Payer: Self-pay | Admitting: Primary Care

## 2020-10-30 ENCOUNTER — Telehealth: Payer: Self-pay

## 2020-10-30 LAB — GLUCOSE, CAPILLARY: Glucose-Capillary: 112 mg/dL — ABNORMAL HIGH (ref 70–99)

## 2020-10-30 NOTE — Telephone Encounter (Signed)
Transition Care Management Unsuccessful Follow-up Telephone Call  Date of discharge and from where:  [facility] 10/28/2020  Attempts:  1st Attempt  Reason for unsuccessful TCM follow-up call:  Left voice message unable to reach at [phone].   Pt needs HFU appt with PCP.

## 2020-10-31 ENCOUNTER — Telehealth: Payer: Self-pay

## 2020-10-31 NOTE — Telephone Encounter (Signed)
Transition Care Management Unsuccessful Follow-up Telephone Call   Date of discharge and from where:  [facility] 10/28/2020   Attempts:  2nd Attempt   Reason for unsuccessful TCM follow-up call:  Left voice message unable to reach at [phone].   Pt needs HFU appt with PCP.

## 2020-11-01 ENCOUNTER — Telehealth: Payer: Self-pay

## 2020-11-01 LAB — CULTURE, BLOOD (ROUTINE X 2)
Culture: NO GROWTH
Special Requests: ADEQUATE

## 2020-11-01 NOTE — Telephone Encounter (Signed)
Transition Care Management Unsuccessful Follow-up Telephone Call  Date of discharge and from where:  10/28/2020, [facility]   Attempts:  3rd Attempt  Reason for unsuccessful TCM follow-up call:  Left voice message on # [phone].  Letter sent to patient requesting he contact [facility] to schedule an appointment with PCP.

## 2020-11-05 LAB — DRUG SCREEN 10 W/CONF, SERUM
Amphetamines, IA: NEGATIVE ng/mL
Barbiturates, IA: NEGATIVE ug/mL
Benzodiazepines, IA: NEGATIVE ng/mL
Cocaine & Metabolite, IA: NEGATIVE ng/mL
Methadone, IA: NEGATIVE ng/mL
Opiates, IA: NEGATIVE ng/mL
Oxycodones, IA: NEGATIVE ng/mL
Phencyclidine, IA: NEGATIVE ng/mL
Propoxyphene, IA: NEGATIVE ng/mL
THC(Marijuana) Metabolite, IA: NEGATIVE ng/mL

## 2020-11-07 ENCOUNTER — Other Ambulatory Visit (INDEPENDENT_AMBULATORY_CARE_PROVIDER_SITE_OTHER): Payer: Self-pay | Admitting: Primary Care

## 2020-11-07 DIAGNOSIS — N186 End stage renal disease: Secondary | ICD-10-CM

## 2020-11-07 NOTE — Telephone Encounter (Signed)
Future OV 11/20/20  Approved per protocol.  Requested Prescriptions  Pending Prescriptions Disp Refills  . hydrOXYzine (ATARAX/VISTARIL) 25 MG tablet [Pharmacy Med Name: hydrOXYzine HCl 25 MG Oral Tablet] 90 tablet 0    Sig: TAKE 1 TABLET BY MOUTH EVERY 8 HOURS AS NEEDED FOR ANXIETY OR  ITCHING     Ear, Nose, and Throat:  Antihistamines Passed - 11/07/2020  4:28 PM      Passed - Valid encounter within last 12 months    Recent Outpatient Visits          1 month ago Type 2 diabetes mellitus without complication, without long-term current use of insulin ([place])   [place] [facility] [name], NP   2 months ago Hospital discharge follow-up   [place], [place], NP   7 months ago Prurigo nodularis   [place], Michelle P, NP   8 months ago Prurigo nodularis   [place] [name], NP   10 months ago Prediabetes   [place] [name], NP      Future Appointments            In 1 week Oletta Lamas Milford Cage, NP [place]

## 2020-11-07 NOTE — Telephone Encounter (Signed)
Attempted to schedule hospital follow up appointment, PCP next available is not until 12/17/2020. Scheduled patient a office visit for 11/20/2020 patient would like a sooner appointment to discuss his medication and is hospital visit, please reach out if theres any cancellations. Best # to reach patient [phone]

## 2020-11-08 NOTE — Telephone Encounter (Signed)
Call placed to patient to schedule appointment to be seen sooner. Message left with call back requested.

## 2020-11-12 ENCOUNTER — Other Ambulatory Visit: Payer: Self-pay

## 2020-11-12 ENCOUNTER — Ambulatory Visit (HOSPITAL_COMMUNITY)
Admission: EM | Admit: 2020-11-12 | Discharge: 2020-11-12 | Disposition: A | Discharge status: Home / Self Care | Payer: Medicare Other | Attending: Urgent Care | Admitting: Urgent Care

## 2020-11-12 ENCOUNTER — Ambulatory Visit (INDEPENDENT_AMBULATORY_CARE_PROVIDER_SITE_OTHER): Payer: Medicare Other

## 2020-11-12 ENCOUNTER — Encounter (HOSPITAL_COMMUNITY): Payer: Self-pay

## 2020-11-12 DIAGNOSIS — M19019 Primary osteoarthritis, unspecified shoulder: Secondary | ICD-10-CM

## 2020-11-12 DIAGNOSIS — M1611 Unilateral primary osteoarthritis, right hip: Secondary | ICD-10-CM

## 2020-11-12 DIAGNOSIS — I10 Essential (primary) hypertension: Secondary | ICD-10-CM | POA: Diagnosis not present

## 2020-11-12 DIAGNOSIS — M25511 Pain in right shoulder: Secondary | ICD-10-CM | POA: Diagnosis not present

## 2020-11-12 DIAGNOSIS — N185 Chronic kidney disease, stage 5: Secondary | ICD-10-CM

## 2020-11-12 DIAGNOSIS — I120 Hypertensive chronic kidney disease with stage 5 chronic kidney disease or end stage renal disease: Secondary | ICD-10-CM

## 2020-11-12 DIAGNOSIS — M25551 Pain in right hip: Secondary | ICD-10-CM | POA: Diagnosis not present

## 2020-11-12 DIAGNOSIS — Z8639 Personal history of other endocrine, nutritional and metabolic disease: Secondary | ICD-10-CM

## 2020-11-12 DIAGNOSIS — Z992 Dependence on renal dialysis: Secondary | ICD-10-CM

## 2020-11-12 MED ORDER — OXYCODONE-ACETAMINOPHEN 5-325 MG PO TABS: 1.0000 | ORAL_TABLET | Freq: Three times a day (TID) | ORAL | 0 refills | Status: DC | PRN

## 2020-11-12 NOTE — Discharge Instructions (Addendum)
Please make sure you follow up with an orthopedist for continued management of your arthritis. Please schedule Tylenol at 500 mg - 650 mg once every 6 hours as needed for aches and pains.  If you still have pain despite taking Tylenol regularly, this is breakthrough pain.  You can use oxycodone once every 6 hours for this.  Once your pain is better controlled, switch back to just Tylenol.

## 2020-11-12 NOTE — ED Provider Notes (Signed)
[place]   MRN: [id number] DOB: 1960-10-29  Subjective:   AUDWIN SEMPER is a 60 y.o. male presenting for 3 to 4-day history of persistent moderate to severe right shoulder pain, right hip pain.  Denies fall, trauma, bruising, swelling, warmth.  Patient has not tried any medications for relief.  Regarding his blood pressure, reports that he tries to be compliant with his blood pressure medications, states he took them this morning.  Denies headache, confusion, dizziness, chest pain, shortness of breath, heart racing, diaphoresis, nausea, vomiting, abdominal pain, hematuria. Patient is a dialysis patient, missed his last dialysis.   No current facility-administered medications for this encounter.  Current Outpatient Medications:    blood glucose meter kit and supplies KIT, Dispense based on patient and insurance preference. Use up to four times daily as directed. (FOR ICD-9 250.00, 250.01)., Disp: 1 each, Rfl: 0   Calcium Acetate 667 MG TABS, Take 1,334 mg by mouth 4 (four) times daily., Disp: , Rfl:    cetirizine (ZYRTEC ALLERGY) 10 MG tablet, Take 1 tablet (10 mg total) by mouth daily as needed for allergies., Disp: 90 tablet, Rfl: 1   cloNIDine (CATAPRES) 0.2 MG tablet, Take  1 tablet twice daily ., Disp: 180 tablet, Rfl: 1   gabapentin (NEURONTIN) 100 MG capsule, Take 1 capsule (100 mg total) by mouth at bedtime., Disp: 30 capsule, Rfl: 0   hydrOXYzine (ATARAX/VISTARIL) 25 MG tablet, TAKE 1 TABLET BY MOUTH EVERY 8 HOURS AS NEEDED FOR ANXIETY OR  ITCHING, Disp: 90 tablet, Rfl: 0   pantoprazole (PROTONIX) 40 MG tablet, Take 1 tablet by mouth twice daily, Disp: 60 tablet, Rfl: 0   triamcinolone cream (KENALOG) 0.1 %, Apply 1 application topically 2 (two) times daily., Disp: , Rfl:    Allergies  Allergen Reactions   Dilaudid [Hydromorphone Hcl] Itching and Other (See Comments)    Pt reports itchiness after IM injection     Past Medical History:  Diagnosis Date    Anemia    Diabetes mellitus without complication [facility])    patient denies   Dialysis patient [facility])    End stage chronic kidney disease ([place])    Hypertension    ICH (intracerebral hemorrhage) ([place]) 05/20/2017   Shoulder pain, left 06/28/2013     Past Surgical History:  Procedure Laterality Date   A/V FISTULAGRAM N/A 08/15/2020   Procedure: A/V FISTULAGRAM - Left Upper;  Surgeon: Cherre Robins, MD;  Location: [facility];  Service: Cardiovascular;  Laterality: N/A;   APPENDECTOMY     AV FISTULA PLACEMENT Left 08/03/2019   Procedure: LEFT ARM ARTERIOVENOUS (AV) CEPHALIC  FISTULA CREATION;  Surgeon: Waynetta Sandy, MD;  Location: [place];  Service: Vascular;  Laterality: Left;   BIOPSY  06/30/2019   Procedure: BIOPSY;  Surgeon: Ronald Lobo, MD;  Location: [place];  Service: Endoscopy;;   BIOPSY  08/02/2019   Procedure: BIOPSY;  Surgeon: Yetta Flock, MD;  Location: [place];  Service: Gastroenterology;;   COLONOSCOPY  01/23/2012   Procedure: COLONOSCOPY;  Surgeon: Danie Binder, MD;  [facility];  Service: Endoscopy;  Laterality: N/A;  11:10 AM   COLONOSCOPY WITH PROPOFOL N/A 06/30/2019   Procedure: COLONOSCOPY WITH PROPOFOL;  Surgeon: Ronald Lobo, MD;  Location: [place];  Service: Endoscopy;  Laterality: N/A;   ENTEROSCOPY N/A 08/02/2019   Procedure: ENTEROSCOPY;  Surgeon: Yetta Flock, MD;  [facility];  Service: Gastroenterology;  Laterality: N/A;   ESOPHAGOGASTRODUODENOSCOPY  N/A 08/10/2020   Procedure: ESOPHAGOGASTRODUODENOSCOPY (EGD);  Surgeon: Jerene Bears, MD;  Location: [facility] ENDOSCOPY;  Service: Gastroenterology;  Laterality: N/A;   ESOPHAGOGASTRODUODENOSCOPY (EGD) WITH PROPOFOL N/A 06/30/2019   Procedure: ESOPHAGOGASTRODUODENOSCOPY (EGD) WITH PROPOFOL;  Surgeon: Ronald Lobo, MD;  Location: [place];  Service: Endoscopy;  Laterality: N/A;   FISTULA SUPERFICIALIZATION Left 10/17/2019   Procedure: LEFT UPPER  EXTREMITY FISTULA REVISION, SIDE BRANCH LIGATION,  AND SUPERFICIALIZATION;  Surgeon: Marty Heck, MD;  Location: [place];  Service: Vascular;  Laterality: Left;   GIVENS CAPSULE STUDY N/A 06/30/2019   Procedure: GIVENS CAPSULE STUDY;  Surgeon: Ronald Lobo, MD;  Location: [place];  Service: Endoscopy;  Laterality: N/A;   HEMOSTASIS CLIP PLACEMENT  08/10/2020   Procedure: HEMOSTASIS CLIP PLACEMENT;  Surgeon: Jerene Bears, MD;  Location: [place] ENDOSCOPY;  Service: Gastroenterology;;   HEMOSTASIS CONTROL  08/02/2019   Procedure: HEMOSTASIS CONTROL;  Surgeon: Yetta Flock, MD;  Location: [place] [facility]OSCOPY;  Service: Gastroenterology;;   INCISION AND DRAINAGE ABSCESS N/A 06/29/2016   Procedure: INCISION AND DRAINAGE ABDOMINAL WALL ABSCESS;  Surgeon: Alphonsa Overall, MD;  Location: [facility];  Service: General;  Laterality: N/A;   INSERTION OF DIALYSIS CATHETER Right 08/03/2019   Procedure: INSERTION OF DIALYSIS CATHETER;  Surgeon: Waynetta Sandy, MD;  Location: [place];  Service: Vascular;  Laterality: Right;   INSERTION OF DIALYSIS CATHETER Right 10/22/2019   Procedure: INSERTION OF 23CM TUNNELED DIALYSIS CATHETER RIGHT INTERNAL JUGULAR;  Surgeon: Angelia Mould, MD;  Location: [place];  Service: Vascular;  Laterality: Right;   INSERTION OF DIALYSIS CATHETER Right 08/12/2020   Procedure: INSERTION OF Right internal Jugular TUNNELED  DIALYSIS CATHETER.;  Surgeon: Waynetta Sandy, MD;  Location: [place];  Service: Vascular;  Laterality: Right;   Left heel surgery     PENILE BIOPSY N/A 03/26/2020   Procedure: PENILE ULCER DEBRIDEMENT;  Surgeon: Remi Haggard, MD;  Location[facility];  Service: Urology;  Laterality: N/A;  74 MINS    Family History  Problem Relation Age of Onset   Colon cancer Neg Hx     Social History   Tobacco Use   Smoking status: Every Day    Packs/day: 1.00    Years: 30.00    Pack years: 30.00    Types: Cigarettes   Smokeless tobacco: Never   Vaping Use   Vaping Use: Never used  Substance Use Topics   Alcohol use: Not Currently    Alcohol/week: 12.0 standard drinks    Types: 12 Cans of beer per week   Drug use: Not Currently    Types: "Crack" cocaine    Comment: last in 2020    ROS   Objective:   Vitals: BP (!) 208/142 (BP Location: Left Arm)   Pulse 86   Temp 98 F (36.7 C) (Oral)   Resp (!) 22   SpO2 94%   BP 189/125 on recheck by PA-Aariyah Sampey.  Physical Exam Constitutional:      General: He is not in acute distress.    Appearance: Normal appearance. He is well-developed and normal weight. He is not ill-appearing, toxic-appearing or diaphoretic.  HENT:     Head: Normocephalic and atraumatic.     Right Ear: External ear normal.     Left Ear: External ear normal.     Nose: Nose normal.     Mouth/Throat:     Mouth: Mucous membranes are moist.     Pharynx: Oropharynx is clear.  Eyes:  General: No scleral icterus.       Right eye: No discharge.        Left eye: No discharge.     Extraocular Movements: Extraocular movements intact.     Pupils: Pupils are equal, round, and reactive to light.  Cardiovascular:     Rate and Rhythm: Normal rate and regular rhythm.     Heart sounds: Normal heart sounds. No murmur heard.   No friction rub. No gallop.  Pulmonary:     Effort: Pulmonary effort is normal. No respiratory distress.     Breath sounds: Normal breath sounds. No stridor. No wheezing, rhonchi or rales.  Musculoskeletal:     Right shoulder: Tenderness (About the anterior and lateral deltoid) present. No swelling, deformity, effusion, laceration, bony tenderness or crepitus. Decreased range of motion. Normal strength.     Cervical back: Normal range of motion.     Right hip: Tenderness (Lateral superior hip extending slightly toward the posterior lateral buttock) present. No deformity, lacerations, bony tenderness or crepitus. Decreased range of motion. Normal strength.  Neurological:     Mental Status: He  is alert and oriented to person, place, and time.     Cranial Nerves: No cranial nerve deficit.     Motor: No weakness.     Coordination: Coordination normal.     Gait: Gait normal.     Deep Tendon Reflexes: Reflexes normal.     Comments: Negative pronator drift.  Psychiatric:        Mood and Affect: Mood normal.        Behavior: Behavior normal.        Thought Content: Thought content normal.        Judgment: Judgment normal.    DG Hip Unilat With Pelvis 2-3 Views Right  Result Date: 11/12/2020 CLINICAL DATA:  60 year old male with 3-4 days of right hip pain. No known injury. EXAM: DG HIP (WITH OR WITHOUT PELVIS) 2-3V RIGHT COMPARISON:  CT Abdomen and Pelvis 10/27/2020. FINDINGS: Suboptimal bone detail due to large body habitus. Femoral heads normally located. Asymmetric right hip joint degeneration better demonstrated by CT. No fracture or dislocation identified. SI joints appear symmetric. Lower lumbar endplate spurring. Negative visible bowel gas pattern. Bilateral calcified femoral artery atherosclerosis. IMPRESSION: 1. Suboptimal bone detail with no acute osseous abnormality identified. 2. Asymmetric right hip joint degeneration better demonstrated by recent CT. 3. Calcified femoral artery atherosclerosis. Electronically Signed   By: Genevie Ann M.D.   On: 11/12/2020 10:02     Assessment and Plan :   I have reviewed the PDMP during this encounter.  1. Arthritis of right hip   2. Acute pain of right shoulder   3. Right hip pain   4. Arthritis pain of shoulder   5. Essential hypertension   6. CKD stage 5 secondary to hypertension ([place])   7. History of diabetes mellitus     Patient has severely uncontrolled hypertension, is not medically compliant and therefore we will hold off on using any steroids.  Recommended Tylenol, oxycodone for breakthrough pain.  Despite his allergy, patient has previously used this without any issues.  Emphasized need for follow-up with Ortho.  Patient has no  signs of an acute encephalopathy, ACS through his HPI or physical exam and therefore we will hold off on an ER visit.  Reports that he does have follow-up with his regular doctor tomorrow, emphasized need to keep this appointment. Counseled patient on potential for adverse effects with medications prescribed/recommended today, ER  and return-to-clinic precautions discussed, patient verbalized understanding.    Jaynee Eagles, PA-C 11/12/20 1031

## 2020-11-12 NOTE — ED Triage Notes (Signed)
Pt reports right shoulder repaint and right sided hip pain x 3- 4 days. Denies and fall, injury.  Pt has not taken any OTC meds for complaint

## 2020-11-14 ENCOUNTER — Other Ambulatory Visit (HOSPITAL_COMMUNITY): Payer: Self-pay

## 2020-11-14 ENCOUNTER — Ambulatory Visit: Payer: Self-pay

## 2020-11-14 ENCOUNTER — Other Ambulatory Visit: Payer: Self-pay

## 2020-11-14 ENCOUNTER — Ambulatory Visit (INDEPENDENT_AMBULATORY_CARE_PROVIDER_SITE_OTHER): Payer: Medicare Other | Admitting: Family Medicine

## 2020-11-14 VITALS — Ht 71.0 in | Wt 265.0 lb

## 2020-11-14 DIAGNOSIS — M25551 Pain in right hip: Secondary | ICD-10-CM | POA: Insufficient documentation

## 2020-11-14 DIAGNOSIS — M25511 Pain in right shoulder: Secondary | ICD-10-CM | POA: Diagnosis present

## 2020-11-14 MED ORDER — METHYLPREDNISOLONE ACETATE 40 MG/ML IJ SUSP
40.0000 mg | Freq: Once | INTRAMUSCULAR | Status: AC
  Administered 2020-11-14: 40 mg via INTRA_ARTICULAR

## 2020-11-14 NOTE — Progress Notes (Signed)
Daniel [name] is a 60 y.o. male who presents to [facility] today for the following:  Right Hip Pain Chronic, many years Worse in last 1.5 weeks Seen at [facility] on 7/18 and told to come here for follow up Not taking anything for pain Pain with bearing weight Worse with flexion States that when the pain is most severe he needs to use a walker Also states that when the pain is severe he has difficulty getting in a car He has started to have some improvement in his pain over the last few weeks He reports that mostly when he has the severe bouts of pain he feels it in his anterior and lateral hip, also radiating into his groin He reports that occasionally he has some pain into his back and buttocks as well, but this is not the most severe pain Denies numbness and tingling  Right Shoulder Pain Also reports that this has been chronic over many years but is worse in the last 2 weeks States that he has difficulty lifting He finds that he has difficulty putting on shirts and getting things from shelves He is right-handed States overhead activity worsens his pain He also states that extending his arm backwards worsens his pain He denies any neck pain States that the pain is mostly in the anterior and lateral aspect of his right shoulder He denies any pain at rest He denies any recent injuries or falls Denies numbness and tingling  PMH reviewed.  He has a history of ESRD on HD Tuesday Thursday and Saturday, reports compliance with HD, denies history of diabetes ROS as above. Medications reviewed.  Exam:  Ht 5\' 11"  (1.803 m)   Wt 265 lb (120.2 kg)   BMI 36.96 kg/m  Gen: Well NAD MSK:  Right Shoulder: Inspection reveals no obvious deformity, atrophy, or asymmetry b/l. No bruising. No swelling Palpation: TTP Biceps, AC, and supraspinatus on right Passive ROM limited in all fields, active ROM with 60 degrees of external rotation, 130 degrees of flexion, 140 degrees of abduction, 20 degrees  of extension, 55 degrees of internal rotation NV intact distally b/l Special Tests:  - Impingement: Positive Hawkins - Supraspinatus: Positive empty can - Infraspinatus/Teres Minor: 5/5 strength with ER - Subscapularis: 5/5 strength with IR - Biceps tendon: Positive speeds, negative Yerrgason's  - Labrum: Negative Obriens, negative clunk, good stability - AC Joint: Positive cross arm - He does have a painful arc and shrugs his right shoulder with abduction  Right Hip:  - Inspection: No gross deformity, no swelling, erythema, or ecchymosis b/l - Palpation: No TTP, specifically none over greater trochanter or IT band b/l - ROM: Internal rotation severely limited on right compared to left, pain with external rotation as well and extreme of flexion - Strength: Normal strength in all fields b/l - Neuro/vasc: NV intact distally b/l - Special Tests: Positive FADIR on right  ULTRASOUND: Shoulder, right  Diagnostic complete ultrasound imaging obtained of patient's right shoulder.  - No obvious evidence of bony deformity or osteophyte development appreciated.  - Pec major tendon intact. - Long head of the biceps tendon: Tendon with heterogenous appearance and increase hypoechoic collections. No bullseye sign.  - Subscapularis tendon: complete visualization across the width of the insertion point yielded no evidence of tendon thickening, calcification, or tears in the long axis view.  - Supraspinatus tendon: complete visualization across the width of the insertion point yielded increased hypoechoic areas at endplate without tendon visualized at region of insertion, no  large effusion noted.  Cortical irregularity at area of insertion. - Infraspinatus and teres minor tendons: visualization across the width of the insertion points yielded no evidence of tendon thickening, calcification, or tears in the long axis view.  - AC Joint: Few oesteophytes noted with hypoechoic area at joint c/w mushroom  sign. - Posterior glenohumeral joint: no abnormalities of labrum. 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  He does have known osteoarthritis at his [facility] joint as well.  Discussed with patient treatment options including corticosteroid injection in the subacromial space, which she would like to proceed with.  Corticosteroid injection performed under ultrasound guidance per above.  He tolerated the procedure well.  We will also have him perform gentle rotator cuff strengthening with yellow Thera-Band.  Given that his ultrasound is suggestive of a chronic tear of his supraspinatus, he would likely not be a candidate for surgery.  Can continue to work with him conservatively.  Could consider an AC joint injection or glenohumeral injection in the future if he does not have good improvement with the subacromial injection.  Follow-up in 4 to 6 weeks.  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  This can also be seen on prior x-rays.  Given this, offered patient a corticosteroid injection which he decided to proceed with today.  The risks and benefits were discussed.  Procedure was performed per above and he tolerated the procedure well.  Can have him follow-up in 4 to 6 weeks as well.   Procedure performed: subacromial corticosteroid injection; US guided  Consent obtained and verified. Time-out conducted. Noted no overlying erythema, induration, or other signs of local infection. The right lateral subacromial space was identified using [place]. The overlying skin was prepped in a sterile fashion. Topical analgesic spray:  Ethyl chloride. Joint: right subacromial Needle: 25 gauge, 1.5 inch Completed without difficulty. Meds: 3 cc 1% lidocaine without epinephrine, 40 mg depo-medrol   Procedure performed: right femoral acetabular joint corticosteroid injection; US guided  The right femoral acetabular joint was identified using ultrasound. Right hip ultrasound-guided intraarticular injection:  After sterile prep with alcohol and anesthetic with Ethyl chloride spray, injected 4 cc 1 % lido and 40 mg methylprednisolone with a 22 gauge spinal needle, passing it through the iliofemoral ligament into the femoral head/neck junction.  Injectate seen filling capsule.  Very good immediate relief.   Advised to call if fevers/chills, erythema, induration, drainage, or persistent bleeding.   [place] Constable, D.O.  PGY-4 [facility]  11/14/2020 5:53 PM

## 2020-11-14 NOTE — Telephone Encounter (Signed)
Another call placed to patient to inquire if he still wants to schedule an appointment to be seen before 11/20/2020. Message left with call back requested to this CM. The patient can be seen at any of our clinics or the mobile medical unit

## 2020-11-14 NOTE — Patient Instructions (Addendum)
Thank you for coming to see me today. It was a pleasure. Today we talked about:   Your right shoulder: You have what looks like an old tear of one of your rotator cuff muscles and you also have some arthritis.  We gave you an injection to help with your pain.  You should also do exercises at least 4 times a week that we gave you with the band.  Your right hip: You have arthritis in your right hip.  We gave you an injection for this.    Today you received an injection with corticosteroid. This injection is usually done in response to pain and inflammation. There is some "numbing" medicine also in the shot so the injected area may be numb and feel really good for the next couple of hours. The numbing medicine usually wears off in 2-3 hours though, and then your pain level will be right back where it was before the injection.   The actually benefit from the steroid injection is usually noticed in 2-7 days. You may actually experience a small (as in 10%) INCREASE in pain in the first 24 hours---that is common.   Things to watch out for that you should contact us or a health care provider urgently would include: 1. Unusual (as in more than 10%) increase in pain 2. New fever > 101.5 3. New swelling or redness of the injected area.  4. Streaking of red lines around the area injected.   Please follow-up with [place] in 4-6 weeks as needed if your pain does not improve or worsens.  If you have any questions or concerns, please do not hesitate to call the office at [phone].  Best,   Arizona Constable, DO [place]

## 2020-11-14 NOTE — Assessment & Plan Note (Signed)
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  This can also be seen on prior x-rays.  Given this, offered patient a corticosteroid injection which he decided to proceed with today.  The risks and benefits were discussed.  Procedure was performed per above and he tolerated the procedure well.  Can have him follow-up in 4 to 6 weeks as well.

## 2020-11-14 NOTE — Assessment & Plan Note (Signed)
Patient's exam and ultrasound findings are most consistent with a chronic states supraspinatus tear and rotator cuff pathology, he also likely has osteoarthritis visualized on prior imaging.  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  Discussed with patient treatment options including corticosteroid injection in the subacromial space, which she would like to proceed with.  Corticosteroid injection performed under ultrasound guidance per above.  He tolerated the procedure well.  We will also have him perform gentle rotator cuff strengthening with yellow Thera-Band.  Given that his ultrasound is suggestive of a rather high-grade tear of his supraspinatus, he would likely not be a candidate for surgery.  Can continue to work with him conservatively.  Could consider an AC joint injection or glenohumeral injection in the future if he does not have good improvement with the subacromial injection.  Follow-up in 4 to 6 weeks.

## 2020-11-15 ENCOUNTER — Encounter: Payer: Self-pay | Admitting: Family Medicine

## 2020-11-19 ENCOUNTER — Encounter (HOSPITAL_COMMUNITY): Payer: Self-pay

## 2020-11-19 ENCOUNTER — Encounter (HOSPITAL_COMMUNITY): Payer: Self-pay | Admitting: *Deleted

## 2020-11-19 ENCOUNTER — Emergency Department (HOSPITAL_COMMUNITY)
Admission: EM | Admit: 2020-11-19 | Discharge: 2020-11-19 | Disposition: A | Discharge status: Home / Self Care | Payer: Medicare Other | Attending: Emergency Medicine | Admitting: Emergency Medicine

## 2020-11-19 ENCOUNTER — Other Ambulatory Visit: Payer: Self-pay

## 2020-11-19 ENCOUNTER — Ambulatory Visit (HOSPITAL_COMMUNITY): Admission: EM | Admit: 2020-11-19 | Discharge: 2020-11-19 | Payer: Medicare Other

## 2020-11-19 DIAGNOSIS — M79601 Pain in right arm: Secondary | ICD-10-CM | POA: Diagnosis not present

## 2020-11-19 DIAGNOSIS — M6281 Muscle weakness (generalized): Secondary | ICD-10-CM | POA: Diagnosis present

## 2020-11-19 DIAGNOSIS — R29898 Other symptoms and signs involving the musculoskeletal system: Secondary | ICD-10-CM

## 2020-11-19 DIAGNOSIS — Z5321 Procedure and treatment not carried out due to patient leaving prior to being seen by health care provider: Secondary | ICD-10-CM | POA: Diagnosis not present

## 2020-11-19 DIAGNOSIS — R262 Difficulty in walking, not elsewhere classified: Secondary | ICD-10-CM | POA: Diagnosis not present

## 2020-11-19 DIAGNOSIS — R259 Unspecified abnormal involuntary movements: Secondary | ICD-10-CM | POA: Diagnosis not present

## 2020-11-19 LAB — CBG MONITORING, ED: Glucose-Capillary: 173 mg/dL — ABNORMAL HIGH (ref 70–99)

## 2020-11-19 NOTE — ED Notes (Signed)
Patient decided to leave.   

## 2020-11-19 NOTE — ED Triage Notes (Signed)
Pt reports he started dropping things 2 days ago. Pt reports his arm keeps jumping .

## 2020-11-19 NOTE — ED Notes (Signed)
PT reports his legs are to weak to walk.

## 2020-11-19 NOTE — ED Provider Notes (Signed)
[place]    CSN: [id number] Arrival date & time: 11/19/20  0539      History   Chief Complaint Chief Complaint  Patient presents with   Dental Pain   RT hand pain   Leg Pain    HPI Daniel [name] is a 60 y.o. male.   I was called into triage to evaluate patient at nursing request.  Patient reports a several day history of lower extremity weakness to the point he is having difficulty ambulating.  He also reports that he has been having right arm pain as well as involuntary movements of the hand as he is having difficulty gripping objects in his hand keeps jumping.  He denies any recent head injury or medication changes.  He denies associated headache, dizziness, chest pain, shortness of breath.  He does have a history of end-stage renal disease on dialysis and reports attending all treatments and not leaving early.  He denies any recent electrolyte imbalances.  He did attend last dialysis treatment on Saturday (11/17/2020) and completed treatment.  He denies any focal unilateral weakness, changes in voice, seizure activity.  He does have some slurred speech on exam but states this is chronic.   Past Medical History:  Diagnosis Date   Anemia    Diabetes mellitus without complication [facility])    patient denies   Dialysis patient [facility])    End stage chronic kidney disease ([place])    Hypertension    ICH (intracerebral hemorrhage) ([place]) 05/20/2017   Shoulder pain, left 06/28/2013    Patient Active Problem List   Diagnosis Date Noted   Pain in joint of right shoulder 11/14/2020   Right hip pain 11/14/2020   Seizure-like activity ([place]) 10/27/2020   History of alcohol abuse 10/27/2020   History of cocaine abuse ([place]) 10/27/2020   Nicotine dependence, cigarettes, uncomplicated 76/73/4193   Fever 10/27/2020   Acute GI bleeding 08/10/2020   Gastrointestinal hemorrhage with melena    Duodenal ulcer with hemorrhage    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    ESRD on hemodialysis ([place])    AVM (arteriovenous malformation) of small bowel, acquired with hemorrhage    Symptomatic anemia 07/31/2019   Acute on chronic anemia 07/23/2019   Chronic kidney disease, stage V ([place])    Chronic hepatitis C without hepatic coma ([place]) 07/11/2019   Iron deficiency anemia 03/30/2019   CKD (chronic kidney disease), stage V ([place]) 03/30/2019   Alcohol use disorder 03/30/2019   B12 deficiency 03/30/2019   Orthostatic hypotension 01/22/2019   Anemia    Essential hypertension 06/29/2016   DM2 (diabetes mellitus, type 2) ([place]) 06/29/2016    Past Surgical History:  Procedure Laterality Date   A/V FISTULAGRAM N/A 08/15/2020   Procedure: A/V FISTULAGRAM - Left Upper;  Surgeon: Cherre Robins, MD;  Location: [place] CV LAB;  Service: Cardiovascular;  Laterality: N/A;   APPENDECTOMY     AV FISTULA PLACEMENT Left 08/03/2019   Procedure: LEFT ARM ARTERIOVENOUS (AV) CEPHALIC  FISTULA CREATION;  Surgeon: Waynetta Sandy, MD;  Location: [place];  Service: Vascular;  Laterality: Left;   BIOPSY  06/30/2019   Procedure: BIOPSY;  Surgeon: Ronald Lobo, MD;  Location: [place];  Service: Endoscopy;;   BIOPSY  08/02/2019   Procedure: BIOPSY;  Surgeon: Yetta Flock, MD;  Location: [place];  Service: Gastroenterology;;   COLONOSCOPY  01/23/2012   Procedure: COLONOSCOPY;  Surgeon: Danie Binder, MD;  [facility];  Service: Endoscopy;  Laterality: N/A;  11:10 AM   COLONOSCOPY WITH PROPOFOL N/A 06/30/2019   Procedure: COLONOSCOPY WITH PROPOFOL;  Surgeon: Ronald Lobo, MD;  Location: [place];  Service: Endoscopy;  Laterality: N/A;   ENTEROSCOPY N/A 08/02/2019   Procedure: ENTEROSCOPY;  Surgeon: Yetta Flock, MD;  Location: [facility] ENDOSCOPY;  Service: Gastroenterology;  Laterality: N/A;   ESOPHAGOGASTRODUODENOSCOPY N/A 08/10/2020   Procedure: ESOPHAGOGASTRODUODENOSCOPY (EGD);  Surgeon: Jerene Bears, MD;  Location: [facility] ENDOSCOPY;  Service:  Gastroenterology;  Laterality: N/A;   ESOPHAGOGASTRODUODENOSCOPY (EGD) WITH PROPOFOL N/A 06/30/2019   Procedure: ESOPHAGOGASTRODUODENOSCOPY (EGD) WITH PROPOFOL;  Surgeon: Ronald Lobo, MD;  Location: [place];  Service: Endoscopy;  Laterality: N/A;   FISTULA SUPERFICIALIZATION Left 10/17/2019   Procedure: LEFT UPPER EXTREMITY FISTULA REVISION, SIDE BRANCH LIGATION,  AND SUPERFICIALIZATION;  Surgeon: Marty Heck, MD;  Location: [place];  Service: Vascular;  Laterality: Left;   GIVENS CAPSULE STUDY N/A 06/30/2019   Procedure: GIVENS CAPSULE STUDY;  Surgeon: Ronald Lobo, MD;  Location: [place];  Service: Endoscopy;  Laterality: N/A;   HEMOSTASIS CLIP PLACEMENT  08/10/2020   Procedure: HEMOSTASIS CLIP PLACEMENT;  Surgeon: Jerene Bears, MD;  Location: [place] ENDOSCOPY;  Service: Gastroenterology;;   HEMOSTASIS CONTROL  08/02/2019   Procedure: HEMOSTASIS CONTROL;  Surgeon: Yetta Flock, MD;  Location: [place] ENDOSCOPY;  Service: Gastroenterology;;   INCISION AND DRAINAGE ABSCESS N/A 06/29/2016   Procedure: INCISION AND DRAINAGE ABDOMINAL WALL ABSCESS;  Surgeon: Alphonsa Overall, MD;  Location: [facility];  Service: General;  Laterality: N/A;   INSERTION OF DIALYSIS CATHETER Right 08/03/2019   Procedure: INSERTION OF DIALYSIS CATHETER;  Surgeon: Waynetta Sandy, MD;  Location: [place];  Service: Vascular;  Laterality: Right;   INSERTION OF DIALYSIS CATHETER Right 10/22/2019   Procedure: INSERTION OF 23CM TUNNELED DIALYSIS CATHETER RIGHT INTERNAL JUGULAR;  Surgeon: Angelia Mould, MD;  Location: [place];  Service: Vascular;  Laterality: Right;   INSERTION OF DIALYSIS CATHETER Right 08/12/2020   Procedure: INSERTION OF Right internal Jugular TUNNELED  DIALYSIS CATHETER.;  Surgeon: Waynetta Sandy, MD;  Location: [place];  Service: Vascular;  Laterality: Right;   Left heel surgery     PENILE BIOPSY N/A 03/26/2020   Procedure: PENILE ULCER DEBRIDEMENT;  Surgeon: Remi Haggard,  MD;  Location: [facility];  Service: Urology;  Laterality: N/A;  30 MINS       Home Medications    Prior to Admission medications   Medication Sig Start Date End Date Taking? Authorizing Provider  blood glucose meter kit and supplies KIT Dispense based on patient and insurance preference. Use up to four times daily as directed. (FOR ICD-9 250.00, 250.01). 07/01/16  Yes Nita Sells, MD  Calcium Acetate 667 MG TABS Take 1,334 mg by mouth 4 (four) times daily. 09/11/20  Yes [provider]  cetirizine (ZYRTEC ALLERGY) 10 MG tablet Take 1 tablet (10 mg total) by mouth daily as needed for allergies. 08/22/20  Yes [name], NP  cloNIDine (CATAPRES) 0.2 MG tablet Take  1 tablet twice daily . 10/28/20  Yes Donnamae Jude, MD  gabapentin (NEURONTIN) 100 MG capsule Take 1 capsule (100 mg total) by mouth at bedtime. 08/16/20  Yes Florencia Reasons, MD  hydrOXYzine (ATARAX/VISTARIL) 25 MG tablet TAKE 1 TABLET BY MOUTH EVERY 8 HOURS AS NEEDED FOR ANXIETY OR  ITCHING 11/07/20  Yes [name], NP  oxyCODONE-acetaminophen (PERCOCET/ROXICET) 5-325 MG tablet Take 1 tablet by mouth every 8 (eight) hours as needed for severe pain.  11/12/20  Yes Jaynee Eagles, PA-C  pantoprazole (PROTONIX) 40 MG tablet Take 1 tablet by mouth twice daily 10/30/20  Yes [name], NP  triamcinolone cream (KENALOG) 0.1 % Apply 1 application topically 2 (two) times daily. 08/02/20  Yes [provider]  colchicine 0.6 MG tablet Take 0.5 tablets (0.3 mg total) by mouth 2 (two) times daily. 07/24/20 07/29/20  Noemi Chapel, MD  ferrous sulfate 325 (65 FE) MG tablet SMARTSIG:1 Tablet(s) By Mouth Twice Daily 08/11/19 02/09/20  [provider]  furosemide (LASIX) 40 MG tablet Take 1 tablet (40 mg total) by mouth daily. 07/25/19 02/09/20  Ladona Horns, MD    Family History Family History  Problem Relation Age of Onset   Colon cancer Neg Hx     Social History Social History   Tobacco Use   Smoking  status: Every Day    Packs/day: 1.00    Years: 30.00    Pack years: 30.00    Types: Cigarettes   Smokeless tobacco: Never  Vaping Use   Vaping Use: Never used  Substance Use Topics   Alcohol use: Not Currently    Alcohol/week: 12.0 standard drinks    Types: 12 Cans of beer per week   Drug use: Not Currently    Types: "Crack" cocaine    Comment: last in 2020     Allergies   Dilaudid [hydromorphone hcl]   Review of Systems Review of Systems  Constitutional:  Positive for activity change. Negative for appetite change, fatigue and fever.  Eyes:  Negative for visual disturbance.  Respiratory:  Negative for cough and shortness of breath.   Cardiovascular:  Negative for chest pain.  Musculoskeletal:  Positive for arthralgias and gait problem. Negative for joint swelling and myalgias.  Neurological:  Positive for weakness. Negative for dizziness, light-headedness and headaches.    Physical Exam Triage Vital Signs ED Triage Vitals  Enc Vitals Group     BP 11/19/20 0955 (!) 178/83     Pulse Rate 11/19/20 0955 71     Resp 11/19/20 0955 20     Temp 11/19/20 0955 98 F (36.7 C)     Temp src --      SpO2 11/19/20 0955 99 %     Weight --      Height --      Head Circumference --      Peak Flow --      Pain Score 11/19/20 0959 6     Pain Loc --      Pain Edu? --      Excl. in [place]? --    No data found.  Updated Vital Signs BP (!) 149/97   Pulse 78   Temp 98.2 F (36.8 C)   Resp 20   SpO2 100%   Visual Acuity Right Eye Distance:   Left Eye Distance:   Bilateral Distance:    Right Eye Near:   Left Eye Near:    Bilateral Near:     Physical Exam Vitals reviewed.  Constitutional:      General: He is awake.     Appearance: Normal appearance. He is normal weight. He is not ill-appearing.     Comments: Pleasant male appears stated age sitting comfortably in triage room in no acute distress  HENT:     Head: Normocephalic and atraumatic.     Mouth/Throat:      Pharynx: No oropharyngeal exudate, posterior oropharyngeal erythema or uvula swelling.  Abdominal:     General: Bowel  sounds are normal.     Palpations: Abdomen is soft.     Tenderness: There is no abdominal tenderness.  Musculoskeletal:     Comments: Normal grip strength bilaterally.  Strength 4/5 bilateral upper extremities and 2/5 bilateral lower extremities.  Neurological:     Mental Status: He is alert.     Cranial Nerves: Cranial nerves are intact.     Comments: Cranial nerves II through XII grossly intact.  Psychiatric:        Behavior: Behavior is cooperative.     UC Treatments / Results  Labs (all labs ordered are listed, but only abnormal results are displayed) Labs Reviewed  CBG MONITORING, ED - Abnormal; Notable for the following components:      Result Value   Glucose-Capillary 173 (*)    All other components within normal limits  CBG MONITORING, ED    EKG   Radiology No results found.  Procedures Procedures (including critical care time)  Medications Ordered in UC Medications - No data to display  Initial Impression / Assessment and Plan / UC Course  I have reviewed the triage vital signs and the nursing notes.  Pertinent labs & imaging results that were available during my care of the patient were reviewed by me and considered in my medical decision making (see chart for details).      Discussed with patient that given new onset neurological symptoms it would be best for him to go to the emergency room for further evaluation and management as we do not have access to imaging and lab work obtained would take approximately 1 day to be received.  Patient was initially hesitant and we discussed potentially obtaining some blood work but ultimately he agreed to go to the emergency room.  Unfortunately, he presented alone and does not have any family or friends in the area.  He was transported by nonemergent EMS as vital signs are stable at the time of discharge  but he was not capable of getting down to the emergency room alone.  Final Clinical Impressions(s) / UC Diagnoses   Final diagnoses:  Weakness of both lower extremities  Difficulty walking  Right arm pain  Right arm weakness  Involuntary movements     Discharge Instructions      GO TO THE ER.     ED Prescriptions   None    PDMP not reviewed this encounter.   Terrilee Croak, PA-C 11/19/20 1139

## 2020-11-19 NOTE — Discharge Instructions (Signed)
GO TO THE ER

## 2020-11-19 NOTE — ED Triage Notes (Signed)
Patient complains of bilateral leg weakness x 2-3 days. States that he can walk with walker. Also complains of right arm weakness for same. Patient alert and oriented, also complains of tongue burning. Alert and oriented

## 2020-11-19 NOTE — ED Triage Notes (Signed)
Pt reports Sx's started 2 days ago.

## 2020-11-20 ENCOUNTER — Telehealth (INDEPENDENT_AMBULATORY_CARE_PROVIDER_SITE_OTHER): Payer: Medicaid Other | Admitting: Primary Care

## 2020-12-12 ENCOUNTER — Encounter (HOSPITAL_COMMUNITY): Payer: Self-pay

## 2020-12-12 ENCOUNTER — Ambulatory Visit (HOSPITAL_COMMUNITY)
Admission: EM | Admit: 2020-12-12 | Discharge: 2020-12-12 | Disposition: A | Discharge status: Home / Self Care | Payer: Medicare Other | Attending: Student | Admitting: Student

## 2020-12-12 ENCOUNTER — Ambulatory Visit (INDEPENDENT_AMBULATORY_CARE_PROVIDER_SITE_OTHER): Payer: Medicare Other | Admitting: Family Medicine

## 2020-12-12 ENCOUNTER — Other Ambulatory Visit: Payer: Self-pay

## 2020-12-12 VITALS — BP 150/75 | Ht 71.0 in | Wt 265.0 lb

## 2020-12-12 DIAGNOSIS — M25511 Pain in right shoulder: Secondary | ICD-10-CM

## 2020-12-12 DIAGNOSIS — I10 Essential (primary) hypertension: Secondary | ICD-10-CM | POA: Diagnosis not present

## 2020-12-12 DIAGNOSIS — N186 End stage renal disease: Secondary | ICD-10-CM

## 2020-12-12 DIAGNOSIS — K120 Recurrent oral aphthae: Secondary | ICD-10-CM | POA: Diagnosis not present

## 2020-12-12 DIAGNOSIS — R0789 Other chest pain: Secondary | ICD-10-CM | POA: Diagnosis not present

## 2020-12-12 DIAGNOSIS — Z992 Dependence on renal dialysis: Secondary | ICD-10-CM

## 2020-12-12 MED ORDER — LIDOCAINE VISCOUS HCL 2 % MT SOLN: 5.0000 mL | Freq: Four times a day (QID) | OROMUCOSAL | 0 refills | Status: DC | PRN

## 2020-12-12 MED ORDER — NITROGLYCERIN 0.2 MG/HR TD PT24: MEDICATED_PATCH | TRANSDERMAL | 1 refills | Status: DC

## 2020-12-12 NOTE — ED Triage Notes (Signed)
Pt presents with generalized chest pain X 4 days and lesion on tongue that is painful X 3 weeks.

## 2020-12-12 NOTE — Patient Instructions (Addendum)
It's too early to repeat the shot in your shoulder. We can do this in about 6 weeks if you want. Consider formal physical therapy - let us know if you want to do this and we'll put in a referral. Otherwise do home exercises at least every other day with the theraband. Voltaren gel topically up to 4 times a day. Start the nitro patches. Only 1/4 patch daily.   Nitroglycerin Protocol  Apply 1/4 nitroglycerin patch to affected area daily. Change position of patch within the affected area every 24 hours. You may experience a headache during the first 1-2 weeks of using the patch, these should subside. If you experience headaches after beginning nitroglycerin patch treatment, you may take your preferred over the counter pain reliever. Another side effect of the nitroglycerin patch is skin irritation or rash related to patch adhesive. Please notify our office if you develop more severe headaches or rash, and stop the patch. Tendon healing with nitroglycerin patch may require 12 to 24 weeks depending on the extent of injury. Men should not use if taking Viagra, Cialis, or Levitra.  Do not use if you have migraines or rosacea.    Follow up with me in 6 weeks.

## 2020-12-12 NOTE — Discharge Instructions (Addendum)
-  Magic mouthwash for mouth pain. Swish and spit -Chest wall pain: tylenol, heating pad/warm shower. If you develop left-sided pain at rest, especially if this is associated with shortness of breath, dizziness-head straight to the emergency department or call 911. -Please go home and take your bloodpressure medications. Please check your blood pressure at home or at the pharmacy. If this continues to be >140/90, follow-up with your primary care provider for further blood pressure management/ medication titration. If you develop chest pain, shortness of breath, vision changes, the worst headache of your life- head straight to the ED or call 911.

## 2020-12-12 NOTE — ED Provider Notes (Signed)
[place]    CSN: [id number] Arrival date & time: 12/12/20  9476      History   Chief Complaint Chief Complaint  Patient presents with   Chest Pain    HPI Daniel [name] is a 60 y.o. male presenting with chest wall pain, mouth lesion.  Medical history ESRD on dialysis Tuesday/Thursday/Saturday, hypertension, diabetes.  This patient did not take his antihypertensives this morning, he is not sure what medications he is on for this but states he takes them as directed.  Today does endorse right-sided chest wall pain, no pain at rest but he experiences this with abduction of the right arm.  He denies any left-sided chest pain, shortness of breath, dizziness, headaches, vision changes.  Denies falls, trauma, new activities, recent coughing. Also with canker sore in mouth for about 3 weeks, denies foul taste in mouth, sore throat, trouble swallowing, fever/chills.  HPI  Past Medical History:  Diagnosis Date   Anemia    Diabetes mellitus without complication [facility])    patient denies   Dialysis patient [facility])    End stage chronic kidney disease ([place])    Hypertension    ICH (intracerebral hemorrhage) ([place]) 05/20/2017   Shoulder pain, left 06/28/2013    Patient Active Problem List   Diagnosis Date Noted   Pain in joint of right shoulder 11/14/2020   Right hip pain 11/14/2020   Seizure-like activity ([place]) 10/27/2020   History of alcohol abuse 10/27/2020   History of cocaine abuse ([place]) 10/27/2020   Nicotine dependence, cigarettes, uncomplicated 54/65/0354   Fever 10/27/2020   Acute GI bleeding 08/10/2020   Gastrointestinal hemorrhage with melena    Duodenal ulcer with hemorrhage    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)    AVM (arteriovenous malformation) of small bowel, acquired with hemorrhage    Symptomatic anemia 07/31/2019   Acute on chronic anemia 07/23/2019   Chronic kidney disease, stage V ([place])    Chronic hepatitis  C without hepatic coma ([place]) 07/11/2019   Iron deficiency anemia 03/30/2019   CKD (chronic kidney disease), stage V ([place]) 03/30/2019   Alcohol use disorder 03/30/2019   B12 deficiency 03/30/2019   Orthostatic hypotension 01/22/2019   Anemia    Essential hypertension 06/29/2016   DM2 (diabetes mellitus, type 2) ([place]) 06/29/2016    Past Surgical History:  Procedure Laterality Date   A/V FISTULAGRAM N/A 08/15/2020   Procedure: A/V FISTULAGRAM - Left Upper;  Surgeon: Cherre Robins, MD;  Location: [place] [facility];  Service: Cardiovascular;  Laterality: N/A;   APPENDECTOMY     AV FISTULA PLACEMENT Left 08/03/2019   Procedure: LEFT ARM ARTERIOVENOUS (AV) CEPHALIC  FISTULA CREATION;  Surgeon: Waynetta Sandy, MD;  Location: [place];  Service: Vascular;  Laterality: Left;   BIOPSY  06/30/2019   Procedure: BIOPSY;  Surgeon: Ronald Lobo, MD;  Location: [place];  Service: Endoscopy;;   BIOPSY  08/02/2019   Procedure: BIOPSY;  Surgeon: Yetta Flock, MD;  Location: [place];  Service: Gastroenterology;;   COLONOSCOPY  01/23/2012   Procedure: COLONOSCOPY;  Surgeon: Danie Binder, MD;  [facility];  Service: Endoscopy;  Laterality: N/A;  11:10 AM   COLONOSCOPY WITH PROPOFOL N/A 06/30/2019   Procedure: COLONOSCOPY WITH PROPOFOL;  Surgeon: Ronald Lobo, MD;  Location: [place];  Service: Endoscopy;  Laterality: N/A;   ENTEROSCOPY N/A 08/02/2019   Procedure: ENTEROSCOPY;  Surgeon: Yetta Flock, MD;  Location: [facility]  ENDOSCOPY;  Service: Gastroenterology;  Laterality: N/A;   ESOPHAGOGASTRODUODENOSCOPY N/A 08/10/2020   Procedure: ESOPHAGOGASTRODUODENOSCOPY (EGD);  Surgeon: Jerene Bears, MD;  Location: [facility] ENDOSCOPY;  Service: Gastroenterology;  Laterality: N/A;   ESOPHAGOGASTRODUODENOSCOPY (EGD) WITH PROPOFOL N/A 06/30/2019   Procedure: ESOPHAGOGASTRODUODENOSCOPY (EGD) WITH PROPOFOL;  Surgeon: Ronald Lobo, MD;  Location: [place];  Service: Endoscopy;   Laterality: N/A;   FISTULA SUPERFICIALIZATION Left 10/17/2019   Procedure: LEFT UPPER EXTREMITY FISTULA REVISION, SIDE BRANCH LIGATION,  AND SUPERFICIALIZATION;  Surgeon: Marty Heck, MD;  Location: [place];  Service: Vascular;  Laterality: Left;   GIVENS CAPSULE STUDY N/A 06/30/2019   Procedure: GIVENS CAPSULE STUDY;  Surgeon: Ronald Lobo, MD;  Location: [place];  Service: Endoscopy;  Laterality: N/A;   HEMOSTASIS CLIP PLACEMENT  08/10/2020   Procedure: HEMOSTASIS CLIP PLACEMENT;  Surgeon: Jerene Bears, MD;  Location: [place] ENDOSCOPY;  Service: Gastroenterology;;   HEMOSTASIS CONTROL  08/02/2019   Procedure: HEMOSTASIS CONTROL;  Surgeon: Yetta Flock, MD;  Location: [place] ENDOSCOPY;  Service: Gastroenterology;;   INCISION AND DRAINAGE ABSCESS N/A 06/29/2016   Procedure: INCISION AND DRAINAGE ABDOMINAL WALL ABSCESS;  Surgeon: Alphonsa Overall, MD;  [facility];  Service: General;  Laterality: N/A;   INSERTION OF DIALYSIS CATHETER Right 08/03/2019   Procedure: INSERTION OF DIALYSIS CATHETER;  Surgeon: Waynetta Sandy, MD;  Location: [place];  Service: Vascular;  Laterality: Right;   INSERTION OF DIALYSIS CATHETER Right 10/22/2019   Procedure: INSERTION OF 23CM TUNNELED DIALYSIS CATHETER RIGHT INTERNAL JUGULAR;  Surgeon: Angelia Mould, MD;  Location: [place];  Service: Vascular;  Laterality: Right;   INSERTION OF DIALYSIS CATHETER Right 08/12/2020   Procedure: INSERTION OF Right internal Jugular TUNNELED  DIALYSIS CATHETER.;  Surgeon: Waynetta Sandy, MD;  Location: [place];  Service: Vascular;  Laterality: Right;   Left heel surgery     PENILE BIOPSY N/A 03/26/2020   Procedure: PENILE ULCER DEBRIDEMENT;  Surgeon: Remi Haggard, MD;  [facility];  Service: Urology;  Laterality: N/A;  30 MINS       Home Medications    Prior to Admission medications   Medication Sig Start Date End Date Taking? Authorizing Provider  magic mouthwash (nystatin, lidocaine,  diphenhydrAMINE, alum & mag hydroxide) suspension Swish and spit 5 mLs 4 (four) times daily as needed for mouth pain. 12/12/20  Yes Hazel Sams, PA-C  blood glucose meter kit and supplies KIT Dispense based on patient and insurance preference. Use up to four times daily as directed. (FOR ICD-9 250.00, 250.01). 07/01/16   Nita Sells, MD  Calcium Acetate 667 MG TABS Take 1,334 mg by mouth 4 (four) times daily. 09/11/20   [provider]  cetirizine (ZYRTEC ALLERGY) 10 MG tablet Take 1 tablet (10 mg total) by mouth daily as needed for allergies. 08/22/20   [name], NP  cloNIDine (CATAPRES) 0.2 MG tablet Take  1 tablet twice daily . 10/28/20   Donnamae Jude, MD  gabapentin (NEURONTIN) 100 MG capsule Take 1 capsule (100 mg total) by mouth at bedtime. 08/16/20   Florencia Reasons, MD  hydrOXYzine (ATARAX/VISTARIL) 25 MG tablet TAKE 1 TABLET BY MOUTH EVERY 8 HOURS AS NEEDED FOR ANXIETY OR  ITCHING 11/07/20   [name], NP  oxyCODONE-acetaminophen (PERCOCET/ROXICET) 5-325 MG tablet Take 1 tablet by mouth every 8 (eight) hours as needed for severe pain. 11/12/20   Jaynee Eagles, PA-C  pantoprazole (PROTONIX) 40 MG tablet Take 1 tablet by mouth twice daily  10/30/20   Grayce Sessions, NP  triamcinolone cream (KENALOG) 0.1 % Apply 1 application topically 2 (two) times daily. 08/02/20   [provider]  colchicine 0.6 MG tablet Take 0.5 tablets (0.3 mg total) by mouth 2 (two) times daily. 07/24/20 07/29/20  Eber Hong, MD  ferrous sulfate 325 (65 FE) MG tablet SMARTSIG:1 Tablet(s) By Mouth Twice Daily 08/11/19 02/09/20  [provider]  furosemide (LASIX) 40 MG tablet Take 1 tablet (40 mg total) by mouth daily. 07/25/19 02/09/20  Thom Chimes, MD    Family History Family History  Problem Relation Age of Onset   Colon cancer Neg Hx     Social History Social History   Tobacco Use   Smoking status: Every Day    Packs/day: 1.00    Years: 30.00    Pack years: 30.00     Types: Cigarettes   Smokeless tobacco: Never  Vaping Use   Vaping Use: Never used  Substance Use Topics   Alcohol use: Not Currently    Alcohol/week: 12.0 standard drinks    Types: 12 Cans of beer per week   Drug use: Not Currently    Types: "Crack" cocaine    Comment: last in 2020     Allergies   Dilaudid [hydromorphone hcl]   Review of Systems Review of Systems  HENT:  Positive for mouth sores.   Cardiovascular:        Chest wall pain  All other systems reviewed and are negative.   Physical Exam Triage Vital Signs ED Triage Vitals  Enc Vitals Group     BP 12/12/20 0859 (!) 170/83     Pulse Rate 12/12/20 0859 77     Resp 12/12/20 0859 20     Temp --      Temp src --      SpO2 12/12/20 0859 97 %     Weight --      Height --      Head Circumference --      Peak Flow --      Pain Score 12/12/20 0858 5     Pain Loc --      Pain Edu? --      Excl. in GC? --    No data found.  Updated Vital Signs BP (!) 170/83 (BP Location: Right Arm)   Pulse 77   Temp 99 F (37.2 C) (Oral)   Resp 20   SpO2 97%   Visual Acuity Right Eye Distance:   Left Eye Distance:   Bilateral Distance:    Right Eye Near:   Left Eye Near:    Bilateral Near:     Physical Exam Vitals reviewed.  Constitutional:      General: He is not in acute distress.    Appearance: Normal appearance. He is not ill-appearing, toxic-appearing or diaphoretic.  HENT:     Head: Normocephalic and atraumatic.     Jaw: There is normal jaw occlusion. No trismus, tenderness, swelling, pain on movement or malocclusion.     Salivary Glands: Right salivary gland is not diffusely enlarged or tender. Left salivary gland is not diffusely enlarged or tender.     Right Ear: Hearing normal.     Left Ear: Hearing normal.     Nose: Nose normal.     Mouth/Throat:     Lips: Pink.     Mouth: Mucous membranes are moist. No lacerations or oral lesions.     Dentition: Abnormal dentition. Does not have dentures.  Dental tenderness and dental caries present.     Tongue: No lesions. Tongue does not deviate from midline.     Palate: No mass.     Pharynx: Oropharynx is clear. Uvula midline. No oropharyngeal exudate or posterior oropharyngeal erythema.     Tonsils: No tonsillar exudate or tonsillar abscesses.      Comments: Poor dentician  Small 61m round erythematous lesion R inner cheek consistent with canker sore. No gingival swelling or tenderness. No abscess.  Eyes:     Extraocular Movements: Extraocular movements intact.     Pupils: Pupils are equal, round, and reactive to light.  Cardiovascular:     Rate and Rhythm: Normal rate and regular rhythm.     Pulses:          Radial pulses are 2+ on the right side and 2+ on the left side.     Heart sounds: Normal heart sounds.  Pulmonary:     Effort: Pulmonary effort is normal.     Breath sounds: Normal breath sounds.  Abdominal:     Palpations: Abdomen is soft.     Tenderness: There is no abdominal tenderness. There is no guarding or rebound.  Musculoskeletal:     Right lower leg: No edema.     Left lower leg: No edema.     Comments: R chest wall- TTP below pectoral muscle. Pain elicited with abduction R arm. No bony abnormality or ecchymosis.  Skin:    General: Skin is warm.     Capillary Refill: Capillary refill takes less than 2 seconds.  Neurological:     General: No focal deficit present.     Mental Status: He is alert and oriented to person, place, and time.  Psychiatric:        Mood and Affect: Mood normal.        Behavior: Behavior normal.        Thought Content: Thought content normal.        Judgment: Judgment normal.     UC Treatments / Results  Labs (all labs ordered are listed, but only abnormal results are displayed) Labs Reviewed - No data to display  EKG   Radiology No results found.  Procedures Procedures (including critical care time)  Medications Ordered in UC Medications - No data to display  Initial  Impression / Assessment and Plan / UC Course  I have reviewed the triage vital signs and the nursing notes.  Pertinent labs & imaging results that were available during my care of the patient were reviewed by me and considered in my medical decision making (see chart for details).     This patient is a very pleasant 61y.o. year old male presenting with chest wall pain and aphthous ulcer.   Pain is reproducible. EKG unchanged compared with 10/2020 EKG. BP is elevated at 170/83; this patient has not taken his antihypertensives today. Strongly encouraged him to go home and take these. He denies headaches, dizziness, left-sided chest pain, shortness of breath, vision changes.  ESRD- continue dialysis T/Th/Sat.  Aphthous ulcer- magic mouthwash.   F/u with PCP for recheck.  ED return precautions discussed. Patient verbalizes understanding and agreement.   Coding Level 4 for review of past notes/labs, order and interpretation of labs today, and prescription drug management   Final Clinical Impressions(s) / UC Diagnoses   Final diagnoses:  ESRD on dialysis ([facility]  Essential hypertension  Chest wall pain  Canker sores oral     Discharge Instructions      -  Magic mouthwash for mouth pain. Swish and spit -Chest wall pain: tylenol, heating pad/warm shower. If you develop left-sided pain at rest, especially if this is associated with shortness of breath, dizziness-head straight to the emergency department or call 911. -Please go home and take your bloodpressure medications. Please check your blood pressure at home or at the pharmacy. If this continues to be >140/90, follow-up with your primary care provider for further blood pressure management/ medication titration. If you develop chest pain, shortness of breath, vision changes, the worst headache of your life- head straight to the ED or call 911.      ED Prescriptions     Medication Sig Dispense Auth. Provider   magic mouthwash  (nystatin, lidocaine, diphenhydrAMINE, alum & mag hydroxide) suspension Swish and spit 5 mLs 4 (four) times daily as needed for mouth pain. 180 mL Hazel Sams, PA-C      PDMP not reviewed this encounter.   Hazel Sams, PA-C 12/12/20 [phone]

## 2020-12-13 ENCOUNTER — Encounter: Payer: Self-pay | Admitting: Family Medicine

## 2020-12-13 NOTE — Progress Notes (Signed)
PCP: [name], NP  Subjective:   HPI: Patient is a 60 y.o. male here for right hip and shoulder pain.  7/20: Right Hip Pain Chronic, many years Worse in last 1.5 weeks Seen at [facility] on 7/18 and told to come here for follow up Not taking anything for pain Pain with bearing weight Worse with flexion States that when the pain is most severe he needs to use a walker Also states that when the pain is severe he has difficulty getting in a car He has started to have some improvement in his pain over the last few weeks He reports that mostly when he has the severe bouts of pain he feels it in his anterior and lateral hip, also radiating into his groin He reports that occasionally he has some pain into his back and buttocks as well, but this is not the most severe pain Denies numbness and tingling  Right Shoulder Pain Also reports that this has been chronic over many years but is worse in the last 2 weeks States that he has difficulty lifting He finds that he has difficulty putting on shirts and getting things from shelves He is right-handed States overhead activity worsens his pain He also states that extending his arm backwards worsens his pain He denies any neck pain States that the pain is mostly in the anterior and lateral aspect of his right shoulder He denies any pain at rest He denies any recent injuries or falls Denies numbness and tingling  8/17: Patient reports his right hip is much better. Right shoulder still painful, difficulty getting dressed, with overhead motions. Injection here helped a little bit.  Past Medical History:  Diagnosis Date   Anemia    Diabetes mellitus without complication [facility])    patient denies   Dialysis patient [facility])    End stage chronic kidney disease ([place])    Hypertension    ICH (intracerebral hemorrhage) ([place]) 05/20/2017   Shoulder pain, left 06/28/2013    Current Outpatient Medications on File Prior to Visit  Medication Sig  Dispense Refill   blood glucose meter kit and supplies KIT Dispense based on patient and insurance preference. Use up to four times daily as directed. (FOR ICD-9 250.00, 250.01). 1 each 0   Calcium Acetate 667 MG TABS Take 1,334 mg by mouth 4 (four) times daily.     cetirizine (ZYRTEC ALLERGY) 10 MG tablet Take 1 tablet (10 mg total) by mouth daily as needed for allergies. 90 tablet 1   cloNIDine (CATAPRES) 0.2 MG tablet Take  1 tablet twice daily . 180 tablet 1   gabapentin (NEURONTIN) 100 MG capsule Take 1 capsule (100 mg total) by mouth at bedtime. 30 capsule 0   hydrOXYzine (ATARAX/VISTARIL) 25 MG tablet TAKE 1 TABLET BY MOUTH EVERY 8 HOURS AS NEEDED FOR ANXIETY OR  ITCHING 90 tablet 0   oxyCODONE-acetaminophen (PERCOCET/ROXICET) 5-325 MG tablet Take 1 tablet by mouth every 8 (eight) hours as needed for severe pain. 10 tablet 0   pantoprazole (PROTONIX) 40 MG tablet Take 1 tablet by mouth twice daily 60 tablet 0   triamcinolone cream (KENALOG) 0.1 % Apply 1 application topically 2 (two) times daily.     [DISCONTINUED] colchicine 0.6 MG tablet Take 0.5 tablets (0.3 mg total) by mouth 2 (two) times daily. 14 tablet 0   [DISCONTINUED] ferrous sulfate 325 (65 FE) MG tablet SMARTSIG:1 Tablet(s) By Mouth Twice Daily     [DISCONTINUED] furosemide (LASIX) 40 MG tablet Take 1 tablet (  40 mg total) by mouth daily. 30 tablet 0   No current facility-administered medications on file prior to visit.    Past Surgical History:  Procedure Laterality Date   A/V FISTULAGRAM N/A 08/15/2020   Procedure: A/V FISTULAGRAM - Left Upper;  Surgeon: Cherre Robins, MD;  Location: [place] [facility];  Service: Cardiovascular;  Laterality: N/A;   APPENDECTOMY     AV FISTULA PLACEMENT Left 08/03/2019   Procedure: LEFT ARM ARTERIOVENOUS (AV) CEPHALIC  FISTULA CREATION;  Surgeon: Waynetta Sandy, MD;  Location: [place];  Service: Vascular;  Laterality: Left;   BIOPSY  06/30/2019   Procedure: BIOPSY;  Surgeon:  Ronald Lobo, MD;  Location: [place];  Service: Endoscopy;;   BIOPSY  08/02/2019   Procedure: BIOPSY;  Surgeon: Yetta Flock, MD;  Location: [place];  Service: Gastroenterology;;   COLONOSCOPY  01/23/2012   Procedure: COLONOSCOPY;  Surgeon: Danie Binder, MD;  [facility];  Service: Endoscopy;  Laterality: N/A;  11:10 AM   COLONOSCOPY WITH PROPOFOL N/A 06/30/2019   Procedure: COLONOSCOPY WITH PROPOFOL;  Surgeon: Ronald Lobo, MD;  Location: [place];  Service: Endoscopy;  Laterality: N/A;   ENTEROSCOPY N/A 08/02/2019   Procedure: ENTEROSCOPY;  Surgeon: Yetta Flock, MD;  Location: [facility] ENDOSCOPY;  Service: Gastroenterology;  Laterality: N/A;   ESOPHAGOGASTRODUODENOSCOPY N/A 08/10/2020   Procedure: ESOPHAGOGASTRODUODENOSCOPY (EGD);  Surgeon: Jerene Bears, MD;  Location: [facility] ENDOSCOPY;  Service: Gastroenterology;  Laterality: N/A;   ESOPHAGOGASTRODUODENOSCOPY (EGD) WITH PROPOFOL N/A 06/30/2019   Procedure: ESOPHAGOGASTRODUODENOSCOPY (EGD) WITH PROPOFOL;  Surgeon: Ronald Lobo, MD;  Location: [place];  Service: Endoscopy;  Laterality: N/A;   FISTULA SUPERFICIALIZATION Left 10/17/2019   Procedure: LEFT UPPER EXTREMITY FISTULA REVISION, SIDE BRANCH LIGATION,  AND SUPERFICIALIZATION;  Surgeon: Marty Heck, MD;  Location: [place];  Service: Vascular;  Laterality: Left;   GIVENS CAPSULE STUDY N/A 06/30/2019   Procedure: GIVENS CAPSULE STUDY;  Surgeon: Ronald Lobo, MD;  Location: [place];  Service: Endoscopy;  Laterality: N/A;   HEMOSTASIS CLIP PLACEMENT  08/10/2020   Procedure: HEMOSTASIS CLIP PLACEMENT;  Surgeon: Jerene Bears, MD;  Location: [place] ENDOSCOPY;  Service: Gastroenterology;;   HEMOSTASIS CONTROL  08/02/2019   Procedure: HEMOSTASIS CONTROL;  Surgeon: Yetta Flock, MD;  Location: [place] ENDOSCOPY;  Service: Gastroenterology;;   INCISION AND DRAINAGE ABSCESS N/A 06/29/2016   Procedure: INCISION AND DRAINAGE ABDOMINAL WALL ABSCESS;   Surgeon: Alphonsa Overall, MD;  [facility];  Service: General;  Laterality: N/A;   INSERTION OF DIALYSIS CATHETER Right 08/03/2019   Procedure: INSERTION OF DIALYSIS CATHETER;  Surgeon: Waynetta Sandy, MD;  Location: [place];  Service: Vascular;  Laterality: Right;   INSERTION OF DIALYSIS CATHETER Right 10/22/2019   Procedure: INSERTION OF 23CM TUNNELED DIALYSIS CATHETER RIGHT INTERNAL JUGULAR;  Surgeon: Angelia Mould, MD;  Location: [place];  Service: Vascular;  Laterality: Right;   INSERTION OF DIALYSIS CATHETER Right 08/12/2020   Procedure: INSERTION OF Right internal Jugular TUNNELED  DIALYSIS CATHETER.;  Surgeon: Waynetta Sandy, MD;  Location: [place];  Service: Vascular;  Laterality: Right;   Left heel surgery     PENILE BIOPSY N/A 03/26/2020   Procedure: PENILE ULCER DEBRIDEMENT;  Surgeon: Remi Haggard, MD;  [facility];  Service: Urology;  Laterality: N/A;  30 MINS    Allergies  Allergen Reactions   Dilaudid [Hydromorphone Hcl] Itching and Other (See Comments)    Pt reports itchiness after IM injection  Social History   Socioeconomic History   Marital status: Single    Spouse name: Not on file   Number of children: Not on file   Years of education: Not on file   Highest education level: Not on file  Occupational History   Not on file  Tobacco Use   Smoking status: Every Day    Packs/day: 1.00    Years: 30.00    Pack years: 30.00    Types: Cigarettes   Smokeless tobacco: Never  Vaping Use   Vaping Use: Never used  Substance and Sexual Activity   Alcohol use: Not Currently    Alcohol/week: 12.0 standard drinks    Types: 12 Cans of beer per week   Drug use: Not Currently    Types: "Crack" cocaine    Comment: last in 2020   Sexual activity: Yes    Birth control/protection: None  Other Topics Concern   Not on file  Social History Narrative   Not on file   Social Determinants of Health   Financial Resource Strain: Not on file   Food Insecurity: Not on file  Transportation Needs: Not on file  Physical Activity: Not on file  Stress: Not on file  Social Connections: Not on file  Intimate Partner Violence: Not on file    Family History  Problem Relation Age of Onset   Colon cancer Neg Hx     BP (!) 150/75   Ht 5' 11"  (1.803 m)   Wt 265 lb (120.2 kg)   BMI 36.96 kg/m   No flowsheet data found.  No flowsheet data found.  Review of Systems: See HPI above.     Objective:  Physical Exam:  Gen: NAD, comfortable in exam room  Right shoulder: No swelling, ecchymoses.  No gross deformity. Mild diffuse tenderness. Abduction and flexion to 130 degrees, full IR.  ER 60 degrees Positive Hawkins, Neers. Negative Yergasons. Strength 4/5 with empty can and 5/5 resisted internal/external rotation.  Pain empty can NV intact distally.  Assessment & Plan:  1. Right shoulder pain - chronic supraspinatus tear, rotator cuff impingement.  Subacromial injection helped some but still with pain, difficulty with function.  Offered PT but he declined at this time.  Home exercises, try nitro patches.  F/u in 6 weeks.  Consider repeat injection, PT.

## 2020-12-25 ENCOUNTER — Ambulatory Visit (INDEPENDENT_AMBULATORY_CARE_PROVIDER_SITE_OTHER): Payer: Medicaid Other | Admitting: Primary Care

## 2020-12-28 ENCOUNTER — Other Ambulatory Visit (INDEPENDENT_AMBULATORY_CARE_PROVIDER_SITE_OTHER): Payer: Self-pay | Admitting: Primary Care

## 2020-12-28 ENCOUNTER — Other Ambulatory Visit: Payer: Self-pay

## 2020-12-28 ENCOUNTER — Non-Acute Institutional Stay (HOSPITAL_COMMUNITY)
Admission: RE | Admit: 2020-12-28 | Discharge: 2020-12-28 | Disposition: A | Discharge status: Home / Self Care | Payer: Medicare Other | Source: Ambulatory Visit | Attending: Internal Medicine | Admitting: Internal Medicine

## 2020-12-28 DIAGNOSIS — D649 Anemia, unspecified: Secondary | ICD-10-CM | POA: Insufficient documentation

## 2020-12-28 LAB — PREPARE RBC (CROSSMATCH)

## 2020-12-28 MED ORDER — SODIUM CHLORIDE 0.9% IV SOLUTION: Freq: Once | INTRAVENOUS | Status: DC

## 2020-12-28 NOTE — Telephone Encounter (Signed)
Sent to PCP ?

## 2020-12-28 NOTE — Progress Notes (Signed)
PATIENT CARE CENTER NOTE   Diagnosis: Anemia    Provider: Corliss Parish, MD   Procedure: Blood transfusion    Note:  Patient received 1 unit PRBCs via PIV. Pre-transfusion, patient's blood pressure elevated at 168/87. Patient reports that he did not take his BP medications today because he believed he was not suppose to take mediations before his transfusion. Dr. Shelva Majestic office called and notified of patient's elevated BP. Spoke with Virgilio Frees, RN who advised to proceed with blood transfusion.  Patient received blood transfusion and tolerated well. Post transfusion BP elevated at 193/104. Patient advised to take BP medications upon arrival home. Patient has dialysis appointment tomorrow. Discharge instructions given. Patient alert, oriented and ambulatory at discharge.

## 2020-12-31 LAB — BPAM RBC
Blood Product Expiration Date: 202209302359
ISSUE DATE / TIME: 202209021152
Unit Type and Rh: 7300

## 2020-12-31 LAB — TYPE AND SCREEN
ABO/RH(D): B POS
Antibody Screen: NEGATIVE
Unit division: 0

## 2021-01-05 ENCOUNTER — Encounter (HOSPITAL_COMMUNITY): Payer: Self-pay | Admitting: *Deleted

## 2021-01-05 ENCOUNTER — Other Ambulatory Visit: Payer: Self-pay

## 2021-01-05 ENCOUNTER — Emergency Department (HOSPITAL_COMMUNITY): Payer: Medicare Other

## 2021-01-05 ENCOUNTER — Emergency Department (HOSPITAL_COMMUNITY)
Admission: EM | Admit: 2021-01-05 | Discharge: 2021-01-05 | Disposition: A | Discharge status: Home / Self Care | Payer: Medicare Other | Attending: Emergency Medicine | Admitting: Emergency Medicine

## 2021-01-05 DIAGNOSIS — T148XXA Other injury of unspecified body region, initial encounter: Secondary | ICD-10-CM

## 2021-01-05 DIAGNOSIS — Y92009 Unspecified place in unspecified non-institutional (private) residence as the place of occurrence of the external cause: Secondary | ICD-10-CM | POA: Insufficient documentation

## 2021-01-05 DIAGNOSIS — N186 End stage renal disease: Secondary | ICD-10-CM | POA: Insufficient documentation

## 2021-01-05 DIAGNOSIS — Z992 Dependence on renal dialysis: Secondary | ICD-10-CM | POA: Diagnosis not present

## 2021-01-05 DIAGNOSIS — M2352 Chronic instability of knee, left knee: Secondary | ICD-10-CM | POA: Diagnosis not present

## 2021-01-05 DIAGNOSIS — Z79899 Other long term (current) drug therapy: Secondary | ICD-10-CM | POA: Diagnosis not present

## 2021-01-05 DIAGNOSIS — D631 Anemia in chronic kidney disease: Secondary | ICD-10-CM | POA: Diagnosis not present

## 2021-01-05 DIAGNOSIS — S80212A Abrasion, left knee, initial encounter: Secondary | ICD-10-CM | POA: Diagnosis not present

## 2021-01-05 DIAGNOSIS — W19XXXA Unspecified fall, initial encounter: Secondary | ICD-10-CM | POA: Insufficient documentation

## 2021-01-05 DIAGNOSIS — F17210 Nicotine dependence, cigarettes, uncomplicated: Secondary | ICD-10-CM | POA: Diagnosis not present

## 2021-01-05 DIAGNOSIS — M25561 Pain in right knee: Secondary | ICD-10-CM | POA: Insufficient documentation

## 2021-01-05 DIAGNOSIS — Z23 Encounter for immunization: Secondary | ICD-10-CM | POA: Insufficient documentation

## 2021-01-05 DIAGNOSIS — I120 Hypertensive chronic kidney disease with stage 5 chronic kidney disease or end stage renal disease: Secondary | ICD-10-CM | POA: Diagnosis not present

## 2021-01-05 DIAGNOSIS — R0602 Shortness of breath: Secondary | ICD-10-CM | POA: Insufficient documentation

## 2021-01-05 DIAGNOSIS — R519 Headache, unspecified: Secondary | ICD-10-CM | POA: Diagnosis not present

## 2021-01-05 DIAGNOSIS — S8992XA Unspecified injury of left lower leg, initial encounter: Secondary | ICD-10-CM | POA: Diagnosis present

## 2021-01-05 DIAGNOSIS — E1122 Type 2 diabetes mellitus with diabetic chronic kidney disease: Secondary | ICD-10-CM | POA: Insufficient documentation

## 2021-01-05 LAB — CBC WITH DIFFERENTIAL/PLATELET
Abs Immature Granulocytes: 0.08 10*3/uL — ABNORMAL HIGH (ref 0.00–0.07)
Basophils Absolute: 0 10*3/uL (ref 0.0–0.1)
Basophils Relative: 1 %
Eosinophils Absolute: 0.1 10*3/uL (ref 0.0–0.5)
Eosinophils Relative: 2 %
HCT: 24.5 % — ABNORMAL LOW (ref 39.0–52.0)
Hemoglobin: 7.8 g/dL — ABNORMAL LOW (ref 13.0–17.0)
Immature Granulocytes: 1 %
Lymphocytes Relative: 39 %
Lymphs Abs: 2.6 10*3/uL (ref 0.7–4.0)
MCH: 37.1 pg — ABNORMAL HIGH (ref 26.0–34.0)
MCHC: 31.8 g/dL (ref 30.0–36.0)
MCV: 116.7 fL — ABNORMAL HIGH (ref 80.0–100.0)
Monocytes Absolute: 0.8 10*3/uL (ref 0.1–1.0)
Monocytes Relative: 12 %
Neutro Abs: 3 10*3/uL (ref 1.7–7.7)
Neutrophils Relative %: 45 %
Platelets: 149 10*3/uL — ABNORMAL LOW (ref 150–400)
RBC: 2.1 MIL/uL — ABNORMAL LOW (ref 4.22–5.81)
RDW: 21.7 % — ABNORMAL HIGH (ref 11.5–15.5)
WBC: 6.7 10*3/uL (ref 4.0–10.5)
nRBC: 3 % — ABNORMAL HIGH (ref 0.0–0.2)

## 2021-01-05 LAB — COMPREHENSIVE METABOLIC PANEL
ALT: 37 U/L (ref 0–44)
AST: 76 U/L — ABNORMAL HIGH (ref 15–41)
Albumin: 2.4 g/dL — ABNORMAL LOW (ref 3.5–5.0)
Alkaline Phosphatase: 70 U/L (ref 38–126)
Anion gap: 19 — ABNORMAL HIGH (ref 5–15)
BUN: 56 mg/dL — ABNORMAL HIGH (ref 8–23)
CO2: 26 mmol/L (ref 22–32)
Calcium: 9.3 mg/dL (ref 8.9–10.3)
Chloride: 96 mmol/L — ABNORMAL LOW (ref 98–111)
Creatinine, Ser: 11.81 mg/dL — ABNORMAL HIGH (ref 0.61–1.24)
GFR, Estimated: 4 mL/min — ABNORMAL LOW (ref >60)
Glucose, Bld: 99 mg/dL (ref 70–99)
Potassium: 4.7 mmol/L (ref 3.5–5.1)
Sodium: 141 mmol/L (ref 135–145)
Total Bilirubin: 0.8 mg/dL (ref 0.3–1.2)
Total Protein: 7.3 g/dL (ref 6.5–8.1)

## 2021-01-05 LAB — TROPONIN I (HIGH SENSITIVITY)
Troponin I (High Sensitivity): 18 ng/L — ABNORMAL HIGH (ref <18)
Troponin I (High Sensitivity): 18 ng/L — ABNORMAL HIGH (ref <18)

## 2021-01-05 LAB — BRAIN NATRIURETIC PEPTIDE: B Natriuretic Peptide: 87.9 pg/mL (ref 0.0–100.0)

## 2021-01-05 MED ORDER — TETANUS-DIPHTH-ACELL PERTUSSIS 5-2.5-18.5 LF-MCG/0.5 IM SUSY
0.5000 mL | PREFILLED_SYRINGE | Freq: Once | INTRAMUSCULAR | Status: AC
  Administered 2021-01-05: 0.5 mL via INTRAMUSCULAR
  Filled 2021-01-05: qty 0.5

## 2021-01-05 MED ORDER — BACITRACIN ZINC 500 UNIT/GM EX OINT: 1.0000 "application " | TOPICAL_OINTMENT | Freq: Two times a day (BID) | CUTANEOUS | 0 refills | Status: DC

## 2021-01-05 NOTE — ED Triage Notes (Signed)
Pt from home for fall due to generalized weakness at home. Pt has abrasion to L knee. Due for dialysis this morning. Scheduled dialysis tues, Thursday, Saturday. Denies missing treatment

## 2021-01-05 NOTE — ED Provider Notes (Signed)
[facility] EMERGENCY DEPARTMENT Provider Note   CSN: [id number] Arrival date & time: 01/05/21  0440     History Chief Complaint  Patient presents with   Daniel [name] is a 60 y.o. male.   Fall Associated symptoms include shortness of breath. Pertinent negatives include no chest pain and no abdominal pain.   60 year old male with a history of DM2, anemia, ESRD on hemodialysis Tuesday, Thursday and Saturday who presents to the emergency department with shortness of breath and bilateral knee pain.  He states that he had a recent fall due to knee instability.  He states that "my knees just give out on me.".  He states that his most recent fall he fell onto both of his knees bilaterally.  He endorses some mild pain to each knee.  He denies any recent head trauma or loss of consciousness.  He denies chest pain.  He states he has not missed any dialysis sessions.  Past Medical History:  Diagnosis Date   Anemia    Diabetes mellitus without complication [facility])    patient denies   Dialysis patient [facility])    End stage chronic kidney disease ([place])    Hypertension    ICH (intracerebral hemorrhage) ([place]) 05/20/2017   Shoulder pain, left 06/28/2013    Patient Active Problem List   Diagnosis Date Noted   Pain in joint of right shoulder 11/14/2020   Right hip pain 11/14/2020   Seizure-like activity ([place]) 10/27/2020   History of alcohol abuse 10/27/2020   History of cocaine abuse ([place]) 10/27/2020   Nicotine dependence, cigarettes, uncomplicated 81/01/3158   Fever 10/27/2020   Acute GI bleeding 08/10/2020   Gastrointestinal hemorrhage with melena    Duodenal ulcer with hemorrhage    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   ESRD on hemodialysis ([place])    AVM (arteriovenous malformation) of small bowel, acquired with hemorrhage    Symptomatic anemia 07/31/2019   Acute on chronic anemia 07/23/2019   Chronic kidney disease, stage V ([place])    Chronic  hepatitis C without hepatic coma ([place]) 07/11/2019   Iron deficiency anemia 03/30/2019   CKD (chronic kidney disease), stage V ([place]) 03/30/2019   Alcohol use disorder 03/30/2019   B12 deficiency 03/30/2019   Orthostatic hypotension 01/22/2019   Anemia    Essential hypertension 06/29/2016   DM2 (diabetes mellitus, type 2) ([place]) 06/29/2016    Past Surgical History:  Procedure Laterality Date   A/V FISTULAGRAM N/A 08/15/2020   Procedure: A/V FISTULAGRAM - Left Upper;  Surgeon: Cherre Robins, MD;  Location: [place] [facility];  Service: Cardiovascular;  Laterality: N/A;   APPENDECTOMY     AV FISTULA PLACEMENT Left 08/03/2019   Procedure: LEFT ARM ARTERIOVENOUS (AV) CEPHALIC  FISTULA CREATION;  Surgeon: Waynetta Sandy, MD;  Location: [place];  Service: Vascular;  Laterality: Left;   BIOPSY  06/30/2019   Procedure: BIOPSY;  Surgeon: Ronald Lobo, MD;  Location: [place];  Service: Endoscopy;;   BIOPSY  08/02/2019   Procedure: BIOPSY;  Surgeon: Yetta Flock, MD;  Location: [place];  Service: Gastroenterology;;   COLONOSCOPY  01/23/2012   Procedure: COLONOSCOPY;  Surgeon: Danie Binder, MD;  [facility];  Service: Endoscopy;  Laterality: N/A;  11:10 AM   COLONOSCOPY WITH PROPOFOL N/A 06/30/2019   Procedure: COLONOSCOPY WITH PROPOFOL;  Surgeon: Ronald Lobo, MD;  Location: [place];  Service: Endoscopy;  Laterality: N/A;   ENTEROSCOPY N/A 08/02/2019  Procedure: ENTEROSCOPY;  Surgeon: Armbruster, Steven P, MD;  [facility];  Service: Gastroenterology;  Laterality: N/A;   ESOPHAGOGASTRODUODENOSCOPY N/A 08/10/2020   Procedure: ESOPHAGOGASTRODUODENOSCOPY (EGD);  Surgeon: Pyrtle, Jay M, MD;  [facility];  Service: Gastroenterology;  Laterality: N/A;   ESOPHAGOGASTRODUODENOSCOPY (EGD) WITH PROPOFOL N/A 06/30/2019   Procedure: ESOPHAGOGASTRODUODENOSCOPY (EGD) WITH PROPOFOL;  Surgeon: Buccini, Robert, MD;  [facility];  Service:  Endoscopy;  Laterality: N/A;   FISTULA SUPERFICIALIZATION Left 10/17/2019   Procedure: LEFT UPPER EXTREMITY FISTULA REVISION, SIDE BRANCH LIGATION,  AND SUPERFICIALIZATION;  Surgeon: [name], Christopher J, MD;  [facility];  Service: Vascular;  Laterality: Left;   GIVENS CAPSULE STUDY N/A 06/30/2019   Procedure: GIVENS CAPSULE STUDY;  Surgeon: Buccini, Robert, MD;  [facility];  Service: Endoscopy;  Laterality: N/A;   HEMOSTASIS CLIP PLACEMENT  08/10/2020   Procedure: HEMOSTASIS CLIP PLACEMENT;  Surgeon: Pyrtle, Jay M, MD;  [facility];  Service: Gastroenterology;;   HEMOSTASIS CONTROL  08/02/2019   Procedure: HEMOSTASIS CONTROL;  Surgeon: Armbruster, Steven P, MD;  [facility];  Service: Gastroenterology;;   INCISION AND DRAINAGE ABSCESS N/A 06/29/2016   Procedure: INCISION AND DRAINAGE ABDOMINAL WALL ABSCESS;  Surgeon: David Newman, MD;  Location: [facility];  Service: General;  Laterality: N/A;   INSERTION OF DIALYSIS CATHETER Right 08/03/2019   Procedure: INSERTION OF DIALYSIS CATHETER;  Surgeon: Cain, Brandon Christopher, MD;  [facility];  Service: Vascular;  Laterality: Right;   INSERTION OF DIALYSIS CATHETER Right 10/22/2019   Procedure: INSERTION OF 23CM TUNNELED DIALYSIS CATHETER RIGHT INTERNAL JUGULAR;  Surgeon: Dickson, Christopher S, MD;  [facility];  Service: Vascular;  Laterality: Right;   INSERTION OF DIALYSIS CATHETER Right 08/12/2020   Procedure: INSERTION OF Right internal Jugular TUNNELED  DIALYSIS CATHETER.;  Surgeon: Cain, Brandon Christopher, MD;  [facility];  Service: Vascular;  Laterality: Right;   Left heel surgery     PENILE BIOPSY N/A 03/26/2020   Procedure: PENILE ULCER DEBRIDEMENT;  Surgeon: Newsome, George B, MD;  Location: [facility];  Service: Urology;  Laterality: N/A;  30 MINS       Family History  Problem Relation Age of Onset   Colon cancer Neg Hx     Social History   Tobacco Use   Smoking status: Every Day     Packs/day: 1.00    Years: 30.00    Pack years: 30.00    Types: Cigarettes   Smokeless tobacco: Never  Vaping Use   Vaping Use: Never used  Substance Use Topics   Alcohol use: Not Currently    Alcohol/week: 12.0 standard drinks    Types: 12 Cans of beer per week   Drug use: Not Currently    Types: "Crack" cocaine    Comment: last in 2020    Home Medications Prior to Admission medications   Medication Sig Start Date End Date Taking? Authorizing Provider  bacitracin ointment Apply 1 application topically 2 (two) times daily. 01/05/21  Yes , , MD  amLODipine (NORVASC) 10 MG tablet Take 10 mg by mouth at bedtime. 01/01/21   [provider]  augmented betamethasone dipropionate (DIPROLENE-AF) 0.05 % cream Apply 1 application topically 2 (two) times daily as needed. 01/03/21   [provider]  blood glucose meter kit and supplies KIT Dispense based on patient and insurance preference. Use up to four times daily as directed. (FOR ICD-9 250.00, 250.01). 07/01/16   Samtani, Jai-Gurmukh, MD  Calcium Acetate 667 MG TABS Take   1,334 mg by mouth 4 (four) times daily. 09/11/20   [provider]  cetirizine (ZYRTEC ALLERGY) 10 MG tablet Take 1 tablet (10 mg total) by mouth daily as needed for allergies. 08/22/20   [name], NP  cloNIDine (CATAPRES) 0.1 MG tablet SMARTSIG:1 Tablet(s) By Mouth 01/01/21   [provider]  cloNIDine (CATAPRES) 0.2 MG tablet Take  1 tablet twice daily . 10/28/20   Donnamae Jude, MD  doxepin (SINEQUAN) 10 MG capsule Take 10 mg by mouth at bedtime. 01/03/21   [provider]  gabapentin (NEURONTIN) 100 MG capsule Take 1 capsule (100 mg total) by mouth at bedtime. 08/16/20   Florencia Reasons, MD  hydrOXYzine (ATARAX/VISTARIL) 25 MG tablet TAKE 1 TABLET BY MOUTH EVERY 8 HOURS AS NEEDED FOR ANXIETY OR  ITCHING 11/07/20   [name], NP  lidocaine-prilocaine (EMLA) cream APPLY SMALL AMOUNT TO ACCESS SITE (AVF) 1 TO 2 HOURS  BEFORE DIALYSIS. COVER WITH OCCLUSIVE DRESSING (SARAN WRAP) 12/27/20   [provider]  magic mouthwash (nystatin, lidocaine, diphenhydrAMINE, alum & mag hydroxide) suspension Swish and spit 5 mLs 4 (four) times daily as needed for mouth pain. 12/12/20   Hazel Sams, PA-C  nitroGLYCERIN (NITRODUR - DOSED IN MG/24 HR) 0.2 mg/hr patch Use 1/4 patch daily to the affected area 12/12/20   Hudnall, Sharyn Lull, MD  nystatin (MYCOSTATIN) 100000 UNIT/ML suspension Take by mouth. 12/12/20   [provider]  oxyCODONE-acetaminophen (PERCOCET/ROXICET) 5-325 MG tablet Take 1 tablet by mouth every 8 (eight) hours as needed for severe pain. 11/12/20   Jaynee Eagles, PA-C  pantoprazole (PROTONIX) 40 MG tablet Take 1 tablet by mouth twice daily 10/30/20   [name], NP  triamcinolone cream (KENALOG) 0.1 % Apply 1 application topically 2 (two) times daily. 08/02/20   [provider]  colchicine 0.6 MG tablet Take 0.5 tablets (0.3 mg total) by mouth 2 (two) times daily. 07/24/20 07/29/20  Noemi Chapel, MD  ferrous sulfate 325 (65 FE) MG tablet SMARTSIG:1 Tablet(s) By Mouth Twice Daily 08/11/19 02/09/20  [provider]  furosemide (LASIX) 40 MG tablet Take 1 tablet (40 mg total) by mouth daily. 07/25/19 02/09/20  Ladona Horns, MD    Allergies    Dilaudid [hydromorphone hcl]  Review of Systems   Review of Systems  Constitutional:  Negative for chills and fever.  HENT:  Negative for ear pain and sore throat.   Eyes:  Negative for pain and visual disturbance.  Respiratory:  Positive for shortness of breath. Negative for cough.   Cardiovascular:  Negative for chest pain and palpitations.  Gastrointestinal:  Negative for abdominal pain and vomiting.  Genitourinary:  Negative for dysuria and hematuria.  Musculoskeletal:  Positive for arthralgias. Negative for back pain.  Skin:  Negative for color change and rash.  Neurological:  Negative for seizures and syncope.  All other systems  reviewed and are negative.  Physical Exam Updated Vital Signs BP (!) 173/76   Pulse 82   Temp 97.6 F (36.4 C) (Oral)   Resp 20   SpO2 99%   Physical Exam Vitals and nursing note reviewed.  Constitutional:      General: He is not in acute distress.    Appearance: He is well-developed. He is not ill-appearing.     Comments: GCS 15, ABC intact  HENT:     Head: Normocephalic and atraumatic.  Eyes:     Conjunctiva/sclera: Conjunctivae normal.  Neck:     Comments: No midline  tenderness to palpation of the cervical spine. ROM intact. Cardiovascular:     Rate and Rhythm: Normal rate and regular rhythm.     Heart sounds: No murmur heard. Pulmonary:     Effort: Pulmonary effort is normal. No respiratory distress.     Breath sounds: Normal breath sounds.  Chest:     Comments: Chest wall stable and non-tender to AP and lateral compression. Clavicles stable and non-tender to AP compression Abdominal:     Palpations: Abdomen is soft.     Tenderness: There is no abdominal tenderness.     Comments: Pelvis stable to lateral compression.  Musculoskeletal:     Cervical back: Neck supple.     Comments: No midline tenderness to palpation of the thoracic or lumbar spine.  With mild tenderness to palpation of the knees bilaterally with intact passive range of motion.  Negative valgus or varus stress.  Abrasion to the left knee noted.  Hemostatic  Skin:    General: Skin is warm and dry.  Neurological:     Mental Status: He is alert.     Comments: CN II-XII grossly intact. Moving all four extremities spontaneously and sensation grossly intact.    ED Results / Procedures / Treatments   Labs (all labs ordered are listed, but only abnormal results are displayed) Labs Reviewed  CBC WITH DIFFERENTIAL/PLATELET - Abnormal; Notable for the following components:      Result Value   RBC 2.10 (*)    Hemoglobin 7.8 (*)    HCT 24.5 (*)    MCV 116.7 (*)    MCH 37.1 (*)    RDW 21.7 (*)    Platelets  149 (*)    nRBC 3.0 (*)    Abs Immature Granulocytes 0.08 (*)    All other components within normal limits  COMPREHENSIVE METABOLIC PANEL - Abnormal; Notable for the following components:   Chloride 96 (*)    BUN 56 (*)    Creatinine, Ser 11.81 (*)    Albumin 2.4 (*)    AST 76 (*)    GFR, Estimated 4 (*)    Anion gap 19 (*)    All other components within normal limits  TROPONIN I (HIGH SENSITIVITY) - Abnormal; Notable for the following components:   Troponin I (High Sensitivity) 18 (*)    All other components within normal limits  TROPONIN I (HIGH SENSITIVITY) - Abnormal; Notable for the following components:   Troponin I (High Sensitivity) 18 (*)    All other components within normal limits  BRAIN NATRIURETIC PEPTIDE  PATHOLOGIST SMEAR REVIEW    EKG EKG Interpretation  Date/Time:  Saturday January 05 2021 11:36:25 EDT Ventricular Rate:  77 PR Interval:  178 QRS Duration: 96 QT Interval:  449 QTC Calculation: 509 R Axis:   -19 Text Interpretation: Sinus rhythm Inferior infarct, old Anteroseptal infarct, old Prolonged QT interval Since last tracing QTc longer Confirmed by ,  ([id number]) on 01/05/2021 1:12:30 PM  Radiology DG Chest 2 View  Result Date: 01/06/2021 CLINICAL DATA:  60-year-old male with shortness of breath and weakness for 3 days. EXAM: CHEST - 2 VIEW COMPARISON:  January 05, 2021. FINDINGS: Image rotated slightly to the RIGHT. Accounting for this cardiomediastinal contours and hilar structures are unchanged with cardiac enlargement and central pulmonary vascular congestion. Mild interstitial prominence is similar to the prior study. Lung volumes are mildly diminished. No sign of effusion. No lobar consolidation. No pneumothorax. On limited assessment there is no acute skeletal process. IMPRESSION: Cardiomegaly with   central pulmonary vascular congestion and mild interstitial prominence similar to the prior study. No signs of effusion or lobar  consolidation. Electronically Signed   By: Geoffrey  Wile M.D.   On: 01/06/2021 19:29   DG Chest 2 View  Result Date: 01/05/2021 CLINICAL DATA:  Shortness of breath.  Fall 2 days ago. EXAM: CHEST - 2 VIEW COMPARISON:  7-22 FINDINGS: Midline trachea. Mild cardiomegaly. Atherosclerosis in the transverse aorta. No pleural effusion or pneumothorax. Lung volumes are low, accentuating the pulmonary interstitium. There is concurrent mild pulmonary interstitial thickening. No lobar consolidation. Left axillary vascular stent. IMPRESSION: Cardiomegaly with mild pulmonary interstitial thickening. This could be related to smoking/chronic bronchitis or pulmonary venous congestion. No overt congestive failure. Aortic Atherosclerosis (ICD10-I70.0). Electronically Signed   By: Kyle  Talbot M.D.   On: 01/05/2021 06:55   CT HEAD WO CONTRAST (5MM)  Result Date: 01/05/2021 CLINICAL DATA:  Head trauma, recent forgetfulness. EXAM: CT HEAD WITHOUT CONTRAST TECHNIQUE: Contiguous axial images were obtained from the base of the skull through the vertex without intravenous contrast. COMPARISON:  October 27, 2020 FINDINGS: Brain: No evidence of acute infarction, hemorrhage, hydrocephalus, extra-axial collection or mass lesion/mass effect. There is chronic diffuse atrophy. Small old lacunar infarctions are identified in bilateral basal ganglia and left thalamus unchanged. Vascular: No hyperdense vessel or unexpected calcification. Skull: Normal. Negative for fracture or focal lesion. Sinuses/Orbits: No acute finding. Other: None. IMPRESSION: 1. No focal acute intracranial abnormality identified. 2. Chronic diffuse atrophy. 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 Electronically Signed   By: Wei-Chen  Lin M.D.   On: 01/05/2021 12:56   CT Knee Left Wo Contrast  Result Date: 01/05/2021 CLINICAL DATA:  Left knee pain.  Recent falls. EXAM: CT OF THE LEFT KNEE WITHOUT CONTRAST TECHNIQUE:  Multidetector CT imaging of the left knee was performed according to the standard protocol. Multiplanar CT image reconstructions were also generated. COMPARISON:  Left knee x-rays from same day. FINDINGS: Bones/Joint/Cartilage No fracture or dislocation. Joint spaces are relatively preserved. Bone infarct in the proximal tibia. No joint effusion. Osteopenia. Ligaments Ligaments are suboptimally evaluated by CT. Muscles and Tendons Grossly intact. Soft tissue No fluid collection or hematoma.  No soft tissue mass. IMPRESSION: 1. No acute osseous abnormality. 2. Bone infarct in the proximal tibia. Electronically Signed   By: William T Derry M.D.   On: 01/05/2021 12:41   CT Knee Right Wo Contrast  Result Date: 01/05/2021 CLINICAL DATA:  Right knee pain.  Recent falls. EXAM: CT OF THE RIGHT KNEE WITHOUT CONTRAST TECHNIQUE: Multidetector CT imaging of the right knee was performed according to the standard protocol. Multiplanar CT image reconstructions were also generated. COMPARISON:  Right knee x-rays from same day. FINDINGS: Bones/Joint/Cartilage No fracture or dislocation. Joint spaces are relatively preserved. Chronic bone infarcts in the distal femoral metaphysis and lateral femoral condyle. No joint effusion. Osteopenia. Ligaments Ligaments are suboptimally evaluated by CT. Muscles and Tendons Grossly intact. Soft tissue No fluid collection or hematoma.  No soft tissue mass. IMPRESSION: 1. No acute osseous abnormality. 2. 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 Electronically Signed   By: William T Derry M.D.   On: 01/05/2021 12:45   DG Knee Complete 4 Views Left  Result Date: 01/05/2021 CLINICAL DATA:  Left greater than right knee pain.  Fall 2 days ago. EXAM: LEFT KNEE - COMPLETE 4+ VIEW COMPARISON:  None. FINDINGS: Advanced vascular calcifications. Enchondroma versus infarct within the proximal tibia. No acute fracture or dislocation. Mild   patellofemoral and medial compartment joint space narrowing and  subchondral sclerosis. No joint effusion. IMPRESSION: No acute osseous abnormality. Enchondroma versus infarct in the proximal tibia. Electronically Signed   By: Abigail Miyamoto M.D.   On: 01/05/2021 06:58   DG Knee Complete 4 Views Right  Result Date: 01/05/2021 CLINICAL DATA:  60 year old male status post fall 2 days ago with continued pain. EXAM: RIGHT KNEE - COMPLETE 4+ VIEW COMPARISON:  None. FINDINGS: Severe calcified peripheral vascular disease. Knee joint spaces and alignment are maintained. Patella intact. 3-4 cm medullary space globular, curvilinear sclerotic lesion. This has a narrow zone of transition and appears to be benign. Bone infarct is possible. But no superimposed acute osseous abnormality, fracture or dislocation identified. No joint effusion is evident. IMPRESSION: 1. No acute fracture or dislocation identified about the right knee. A 3-4 cm sclerotic lesion in the medullary space of the distal femur has a benign appearance, might be a chronic bone infarct. 2. Severe calcified peripheral vascular disease. Electronically Signed   By: Genevie Ann M.D.   On: 01/05/2021 06:59    Procedures Procedures   Medications Ordered in ED Medications  Tdap (BOOSTRIX) injection 0.5 mL (0.5 mLs Intramuscular Given 01/05/21 1156)    ED Course  I have reviewed the triage vital signs and the nursing notes.  Pertinent labs & imaging results that were available during my care of the patient were reviewed by me and considered in my medical decision making (see chart for details).    MDM Rules/Calculators/A&P                           60 year old male with medical history as above who presents to the emergency department with knee instability and pain bilaterally after a fall.  No head trauma or loss of consciousness.  Cervical spine cleared by Nexus criteria.  Imaging obtained to include CT head, x-rays of the knees bilaterally, x-ray of the chest.  X-rays of the knees concerning for possible infarcts  of the femur and tibia.  CTs of the knees ordered with no clear fracture, bone infarct in the proximal tibia on the left, chronic bone infarcts in the distal femur on the right.  Negative valgus and varus stress although patient can follow-up outpatient for MRI imaging if concerning for chronic ligamentous instability of the knee.   Patient did have an abrasion to the left knee on exam. Patient was given a knee brace and advised to follow-up outpatient.  Screening labs obtained as the patient is a dialysis patient without significant elevation of the patient's potassium.  His serum potassium is 4.7.  His BUN is mildly elevated at 56.  He did not go to his dialysis session today but he is not overly short of breath, lungs are clear to auscultation bilaterally.  Chest x-ray with mild pulmonary interstitial thickening and cardiomegaly with no overt congestive failure.  At this time, I do not see an indication for emergent dialysis.  Recommended that the patient follow-up during his regular scheduled dialysis session on Tuesday.  If worsening shortness of breath, the patient can call dialysis to try and get in earlier or present back to the emergency department.  DC Instructions: Apply bacitracin ointment to your knee abrasion daily.  Your imaging of your knees did not show any acute fractures.  If you have continued instability of your knee, outpatient MRI of the knee could reveal ligamentous injury.  Final Clinical Impression(s) / ED  Diagnoses Final diagnoses:  Fall, initial encounter  Abrasion  Chronic knee instability, left    Rx / DC Orders ED Discharge Orders          Ordered    Knee brace       Comments: L knee   01/05/21 1357    bacitracin ointment  2 times daily        01/05/21 1359             , , MD 01/06/21 1951  

## 2021-01-05 NOTE — ED Provider Notes (Signed)
Emergency Medicine Provider Triage Evaluation Note  Daniel [name] , a 61 y.o. male  was evaluated in triage.  Pt complains of dentalized weakness and recurrent falls onset 2-3 days ago.  Patient reports he does have ESRD and is on dialysis.  He normally goes Tuesday, Thursday and Saturday.  Reports no missed sessions.  He does have shortness of breath this morning.  Reports he has bilateral knee pain secondary to his recurrent falls.  Denies chest pain.  Review of Systems  Positive: Generalized weakness, tremulous, bilateral knee pain Negative: Fever, chills  Physical Exam  BP 123/65 (BP Location: Right Arm)   Pulse 78   Temp 98.3 F (36.8 C) (Oral)   Resp 20   SpO2 98%  Gen:   Awake, no distress   Resp:  Normal effort  MSK:   Moves extremities without difficulty, abrasions to the bilateral knees Other:  Mild tachypnea  Medical Decision Making  Medically screening exam initiated at 5:57 AM.  Appropriate orders placed.  Daniel [name] was informed that the remainder of the evaluation will be completed by another provider, this initial triage assessment does not replace that evaluation, and the importance of remaining in the ED until their evaluation is complete.  Neurolysed weakness.  Afebrile without signs or symptoms of infection.  Work-up pending.   Abigail Butts, Hershal Coria 01/05/21 0557    Orpah Greek, MD 01/05/21 [phone]

## 2021-01-05 NOTE — Discharge Instructions (Addendum)
Apply bacitracin ointment to your knee abrasion daily.  Your imaging of your knees did not show any acute fractures.  If you have continued instability of your knee, outpatient MRI of the knee could reveal ligamentous injury.

## 2021-01-06 ENCOUNTER — Emergency Department (HOSPITAL_COMMUNITY): Payer: Medicare Other

## 2021-01-06 ENCOUNTER — Emergency Department (HOSPITAL_COMMUNITY)
Admission: EM | Admit: 2021-01-06 | Discharge: 2021-01-07 | Disposition: A | Discharge status: Home / Self Care | Payer: Medicare Other | Attending: Emergency Medicine | Admitting: Emergency Medicine

## 2021-01-06 DIAGNOSIS — E1122 Type 2 diabetes mellitus with diabetic chronic kidney disease: Secondary | ICD-10-CM | POA: Diagnosis not present

## 2021-01-06 DIAGNOSIS — F17210 Nicotine dependence, cigarettes, uncomplicated: Secondary | ICD-10-CM | POA: Insufficient documentation

## 2021-01-06 DIAGNOSIS — Z79899 Other long term (current) drug therapy: Secondary | ICD-10-CM | POA: Diagnosis not present

## 2021-01-06 DIAGNOSIS — R259 Unspecified abnormal involuntary movements: Secondary | ICD-10-CM | POA: Insufficient documentation

## 2021-01-06 DIAGNOSIS — R29898 Other symptoms and signs involving the musculoskeletal system: Secondary | ICD-10-CM

## 2021-01-06 DIAGNOSIS — I120 Hypertensive chronic kidney disease with stage 5 chronic kidney disease or end stage renal disease: Secondary | ICD-10-CM | POA: Insufficient documentation

## 2021-01-06 DIAGNOSIS — R609 Edema, unspecified: Secondary | ICD-10-CM | POA: Diagnosis not present

## 2021-01-06 DIAGNOSIS — R0602 Shortness of breath: Secondary | ICD-10-CM | POA: Diagnosis not present

## 2021-01-06 DIAGNOSIS — N186 End stage renal disease: Secondary | ICD-10-CM | POA: Insufficient documentation

## 2021-01-06 DIAGNOSIS — R531 Weakness: Secondary | ICD-10-CM | POA: Diagnosis present

## 2021-01-06 DIAGNOSIS — Z992 Dependence on renal dialysis: Secondary | ICD-10-CM | POA: Insufficient documentation

## 2021-01-06 LAB — CBC WITH DIFFERENTIAL/PLATELET
Abs Immature Granulocytes: 0.09 10*3/uL — ABNORMAL HIGH (ref 0.00–0.07)
Basophils Absolute: 0 10*3/uL (ref 0.0–0.1)
Basophils Relative: 0 %
Eosinophils Absolute: 0.1 10*3/uL (ref 0.0–0.5)
Eosinophils Relative: 1 %
HCT: 22.7 % — ABNORMAL LOW (ref 39.0–52.0)
Hemoglobin: 7.1 g/dL — ABNORMAL LOW (ref 13.0–17.0)
Immature Granulocytes: 1 %
Lymphocytes Relative: 33 %
Lymphs Abs: 2.2 10*3/uL (ref 0.7–4.0)
MCH: 37.4 pg — ABNORMAL HIGH (ref 26.0–34.0)
MCHC: 31.3 g/dL (ref 30.0–36.0)
MCV: 119.5 fL — ABNORMAL HIGH (ref 80.0–100.0)
Monocytes Absolute: 0.7 10*3/uL (ref 0.1–1.0)
Monocytes Relative: 11 %
Neutro Abs: 3.5 10*3/uL (ref 1.7–7.7)
Neutrophils Relative %: 54 %
Platelets: 152 10*3/uL (ref 150–400)
RBC: 1.9 MIL/uL — ABNORMAL LOW (ref 4.22–5.81)
RDW: 22.4 % — ABNORMAL HIGH (ref 11.5–15.5)
WBC: 6.6 10*3/uL (ref 4.0–10.5)
nRBC: 3.5 % — ABNORMAL HIGH (ref 0.0–0.2)

## 2021-01-06 LAB — COMPREHENSIVE METABOLIC PANEL
ALT: 45 U/L — ABNORMAL HIGH (ref 0–44)
AST: 102 U/L — ABNORMAL HIGH (ref 15–41)
Albumin: 2.5 g/dL — ABNORMAL LOW (ref 3.5–5.0)
Alkaline Phosphatase: 68 U/L (ref 38–126)
Anion gap: 18 — ABNORMAL HIGH (ref 5–15)
BUN: 70 mg/dL — ABNORMAL HIGH (ref 8–23)
CO2: 23 mmol/L (ref 22–32)
Calcium: 8.6 mg/dL — ABNORMAL LOW (ref 8.9–10.3)
Chloride: 98 mmol/L (ref 98–111)
Creatinine, Ser: 13.36 mg/dL — ABNORMAL HIGH (ref 0.61–1.24)
GFR, Estimated: 4 mL/min — ABNORMAL LOW (ref >60)
Glucose, Bld: 186 mg/dL — ABNORMAL HIGH (ref 70–99)
Potassium: 5 mmol/L (ref 3.5–5.1)
Sodium: 139 mmol/L (ref 135–145)
Total Bilirubin: 0.8 mg/dL (ref 0.3–1.2)
Total Protein: 7.2 g/dL (ref 6.5–8.1)

## 2021-01-06 LAB — BRAIN NATRIURETIC PEPTIDE: B Natriuretic Peptide: 193.9 pg/mL — ABNORMAL HIGH (ref 0.0–100.0)

## 2021-01-06 LAB — TROPONIN I (HIGH SENSITIVITY)
Troponin I (High Sensitivity): 17 ng/L (ref <18)
Troponin I (High Sensitivity): 19 ng/L — ABNORMAL HIGH (ref <18)

## 2021-01-06 NOTE — ED Triage Notes (Addendum)
Pt c/o SHOB, weakness, "can't hold onto anything" x3 days. Last dialysis Thursday, did not go yesterday (dialysis Tues, Thurs, Sat). Pt speaking in full sentences, holding onto lighter, keys & cigarettes in triage.   8/10 CP, "like fluid"

## 2021-01-06 NOTE — ED Provider Notes (Signed)
Emergency Medicine Provider Triage Evaluation Note  Daniel [name] , a 60 y.o. male  was evaluated in triage.  Pt complains of generalized weakness, according to records was in the ED yesterday but did not stay for work-up.  Also on dialysis Tuesday Thursday Saturday, reports he did not go to dialysis yesterday as he was evaluated in the ED.  Does smoke about a pack a day, holding cigarettes on hand in triage.  Speaking in full sentences.  Review of Systems  Positive: Shortness of breath, abdominal distention Negative: Leg swelling, cough, chest pain  Physical Exam  BP 138/84 (BP Location: Right Arm)   Pulse 100   Temp 98.6 F (37 C) (Oral)   Resp (!) 21   SpO2 99%  Gen:   Awake, no distress  = Resp:  Normal effort = MSK:   Moves extremities without difficulty  Other:    Medical Decision Making  Medically screening exam initiated at 6:00 PM.  Appropriate orders placed.  Joan C Normoyle was informed that the remainder of the evaluation will be completed by another provider, this initial triage assessment does not replace that evaluation, and the importance of remaining in the ED until their evaluation is complete.  Patient here with worsening shortness of breath.  Smokes about a pack a day.  Missed dialysis yesterday.  Does appear short of breath with tachypnea present however speaking in full sentences.  Lungs are diminished to auscultation throughout.   Janeece Fitting, PA-C 01/06/21 1806    Margette Fast, MD 01/13/21 1756

## 2021-01-07 ENCOUNTER — Emergency Department (HOSPITAL_COMMUNITY): Payer: Medicare Other

## 2021-01-07 DIAGNOSIS — R531 Weakness: Secondary | ICD-10-CM | POA: Diagnosis not present

## 2021-01-07 LAB — AMMONIA: Ammonia: 56 umol/L — ABNORMAL HIGH (ref 9–35)

## 2021-01-07 NOTE — ED Provider Notes (Signed)
[facility] EMERGENCY DEPARTMENT Provider Note   CSN: [id number] Arrival date & time: 01/06/21  1747     History Chief Complaint  Patient presents with   Shortness of Breath   Weakness    Daniel [name] is a 60 y.o. male with hx of ESRD on HD, HTN, DM2, prior substance use who presents with bilateral hand weakness.  Patient reports his hands are "jumpy" and that he has difficulty holding items and repeatedly drops things. States symptoms have been present x4 weeks but were worse yesterday. Denies pain in his hands or arms, no numbness or tingling. His hands don't feel particularly weak, thinks he drops things due to the abnormal movements ("jumpiness"). Endorses weakness in bilateral knees, for which he was seen here 2 days ago. Also has some shortness of breath but he is not concerned about that currently.  No changes in his speech, no facial droop, symptoms are equal bilaterally.  Missed dialysis on Sat as he was in the ED, but has not missed any other HD sessions.     Past Medical History:  Diagnosis Date   Anemia    Diabetes mellitus without complication [facility])    patient denies   Dialysis patient [facility])    End stage chronic kidney disease ([place])    Hypertension    ICH (intracerebral hemorrhage) ([place]) 05/20/2017   Shoulder pain, left 06/28/2013    Patient Active Problem List   Diagnosis Date Noted   Pain in joint of right shoulder 11/14/2020   Right hip pain 11/14/2020   Seizure-like activity ([place]) 10/27/2020   History of alcohol abuse 10/27/2020   History of cocaine abuse ([place]) 10/27/2020   Nicotine dependence, cigarettes, uncomplicated 38/88/2800   Fever 10/27/2020   Acute GI bleeding 08/10/2020   Gastrointestinal hemorrhage with melena    Duodenal ulcer with hemorrhage    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   ESRD on hemodialysis ([place])    AVM (arteriovenous malformation) of small bowel, acquired with hemorrhage    Symptomatic  anemia 07/31/2019   Acute on chronic anemia 07/23/2019   Chronic kidney disease, stage V ([place])    Chronic hepatitis C without hepatic coma ([place]) 07/11/2019   Iron deficiency anemia 03/30/2019   CKD (chronic kidney disease), stage V ([place]) 03/30/2019   Alcohol use disorder 03/30/2019   B12 deficiency 03/30/2019   Orthostatic hypotension 01/22/2019   Anemia    Essential hypertension 06/29/2016   DM2 (diabetes mellitus, type 2) ([place]) 06/29/2016    Past Surgical History:  Procedure Laterality Date   A/V FISTULAGRAM N/A 08/15/2020   Procedure: A/V FISTULAGRAM - Left Upper;  Surgeon: Cherre Robins, MD;  Location: [facility];  Service: Cardiovascular;  Laterality: N/A;   APPENDECTOMY     AV FISTULA PLACEMENT Left 08/03/2019   Procedure: LEFT ARM ARTERIOVENOUS (AV) CEPHALIC  FISTULA CREATION;  Surgeon: Waynetta Sandy, MD;  Location: [place];  Service: Vascular;  Laterality: Left;   BIOPSY  06/30/2019   Procedure: BIOPSY;  Surgeon: Ronald Lobo, MD;  Location: [facility];  Service: Endoscopy;;   BIOPSY  08/02/2019   Procedure: BIOPSY;  Surgeon: Yetta Flock, MD;  Location: [facility];  Service: Gastroenterology;;   COLONOSCOPY  01/23/2012   Procedure: COLONOSCOPY;  Surgeon: Danie Binder, MD;  [facility];  Service: Endoscopy;  Laterality: N/A;  11:10 AM   COLONOSCOPY WITH PROPOFOL N/A 06/30/2019   Procedure: COLONOSCOPY WITH PROPOFOL;  Surgeon: Ronald Lobo, MD;  [facility];  Service: Endoscopy;  Laterality: N/A;   ENTEROSCOPY N/A 08/02/2019   Procedure: ENTEROSCOPY;  Surgeon: Yetta Flock, MD;  Location: [facility] ENDOSCOPY;  Service: Gastroenterology;  Laterality: N/A;   ESOPHAGOGASTRODUODENOSCOPY N/A 08/10/2020   Procedure: ESOPHAGOGASTRODUODENOSCOPY (EGD);  Surgeon: Jerene Bears, MD;  Location: [facility] ENDOSCOPY;  Service: Gastroenterology;  Laterality: N/A;   ESOPHAGOGASTRODUODENOSCOPY (EGD) WITH PROPOFOL N/A 06/30/2019   Procedure:  ESOPHAGOGASTRODUODENOSCOPY (EGD) WITH PROPOFOL;  Surgeon: Ronald Lobo, MD;  Location: [place];  Service: Endoscopy;  Laterality: N/A;   FISTULA SUPERFICIALIZATION Left 10/17/2019   Procedure: LEFT UPPER EXTREMITY FISTULA REVISION, SIDE BRANCH LIGATION,  AND SUPERFICIALIZATION;  Surgeon: Marty Heck, MD;  Location: [facility];  Service: Vascular;  Laterality: Left;   GIVENS CAPSULE STUDY N/A 06/30/2019   Procedure: GIVENS CAPSULE STUDY;  Surgeon: Ronald Lobo, MD;  Location: [place];  Service: Endoscopy;  Laterality: N/A;   HEMOSTASIS CLIP PLACEMENT  08/10/2020   Procedure: HEMOSTASIS CLIP PLACEMENT;  Surgeon: Jerene Bears, MD;  Location: [place] ENDOSCOPY;  Service: Gastroenterology;;   HEMOSTASIS CONTROL  08/02/2019   Procedure: HEMOSTASIS CONTROL;  Surgeon: Yetta Flock, MD;  Location: [place] [facility];  Service: Gastroenterology;;   INCISION AND DRAINAGE ABSCESS N/A 06/29/2016   Procedure: INCISION AND DRAINAGE ABDOMINAL WALL ABSCESS;  Surgeon: Alphonsa Overall, MD;  [facility];  Service: General;  Laterality: N/A;   INSERTION OF DIALYSIS CATHETER Right 08/03/2019   Procedure: INSERTION OF DIALYSIS CATHETER;  Surgeon: Waynetta Sandy, MD;  Location: [place];  Service: Vascular;  Laterality: Right;   INSERTION OF DIALYSIS CATHETER Right 10/22/2019   Procedure: INSERTION OF 23CM TUNNELED DIALYSIS CATHETER RIGHT INTERNAL JUGULAR;  Surgeon: Angelia Mould, MD;  Location: [place];  Service: Vascular;  Laterality: Right;   INSERTION OF DIALYSIS CATHETER Right 08/12/2020   Procedure: INSERTION OF Right internal Jugular TUNNELED  DIALYSIS CATHETER.;  Surgeon: Waynetta Sandy, MD;  Location: [place];  Service: Vascular;  Laterality: Right;   Left heel surgery     PENILE BIOPSY N/A 03/26/2020   Procedure: PENILE ULCER DEBRIDEMENT;  Surgeon: Remi Haggard, MD;  [facility];  Service: Urology;  Laterality: N/A;  35 MINS       Family History  Problem Relation  Age of Onset   Colon cancer Neg Hx     Social History   Tobacco Use   Smoking status: Every Day    Packs/day: 1.00    Years: 30.00    Pack years: 30.00    Types: Cigarettes   Smokeless tobacco: Never  Vaping Use   Vaping Use: Never used  Substance Use Topics   Alcohol use: Not Currently    Alcohol/week: 12.0 standard drinks    Types: 12 Cans of beer per week   Drug use: Not Currently    Types: "Crack" cocaine    Comment: last in 2020    Home Medications Prior to Admission medications   Medication Sig Start Date End Date Taking? Authorizing Provider  amLODipine (NORVASC) 10 MG tablet Take 10 mg by mouth at bedtime. 01/01/21   [provider]  augmented betamethasone dipropionate (DIPROLENE-AF) 0.05 % cream Apply 1 application topically 2 (two) times daily as needed. 01/03/21   [provider]  bacitracin ointment Apply 1 application topically 2 (two) times daily. 01/05/21   Regan Lemming, MD  blood glucose meter kit and supplies KIT Dispense based on patient and insurance preference. Use up to four times daily as directed. (FOR  ICD-9 250.00, 250.01). 07/01/16   Nita Sells, MD  Calcium Acetate 667 MG TABS Take 1,334 mg by mouth 4 (four) times daily. 09/11/20   [provider]  cetirizine (ZYRTEC ALLERGY) 10 MG tablet Take 1 tablet (10 mg total) by mouth daily as needed for allergies. 08/22/20   [name], NP  cloNIDine (CATAPRES) 0.1 MG tablet SMARTSIG:1 Tablet(s) By Mouth 01/01/21   [provider]  cloNIDine (CATAPRES) 0.2 MG tablet Take  1 tablet twice daily . 10/28/20   Donnamae Jude, MD  doxepin (SINEQUAN) 10 MG capsule Take 10 mg by mouth at bedtime. 01/03/21   [provider]  gabapentin (NEURONTIN) 100 MG capsule Take 1 capsule (100 mg total) by mouth at bedtime. 08/16/20   Florencia Reasons, MD  hydrOXYzine (ATARAX/VISTARIL) 25 MG tablet TAKE 1 TABLET BY MOUTH EVERY 8 HOURS AS NEEDED FOR ANXIETY OR  ITCHING 11/07/20   [name], NP  lidocaine-prilocaine (EMLA) cream APPLY SMALL AMOUNT TO ACCESS SITE (AVF) 1 TO 2 HOURS BEFORE DIALYSIS. COVER WITH OCCLUSIVE DRESSING (SARAN WRAP) 12/27/20   [provider]  magic mouthwash (nystatin, lidocaine, diphenhydrAMINE, alum & mag hydroxide) suspension Swish and spit 5 mLs 4 (four) times daily as needed for mouth pain. 12/12/20   Hazel Sams, PA-C  nitroGLYCERIN (NITRODUR - DOSED IN MG/24 HR) 0.2 mg/hr patch Use 1/4 patch daily to the affected area 12/12/20   Hudnall, Sharyn Lull, MD  nystatin (MYCOSTATIN) 100000 UNIT/ML suspension Take by mouth. 12/12/20   [provider]  oxyCODONE-acetaminophen (PERCOCET/ROXICET) 5-325 MG tablet Take 1 tablet by mouth every 8 (eight) hours as needed for severe pain. 11/12/20   Jaynee Eagles, PA-C  pantoprazole (PROTONIX) 40 MG tablet Take 1 tablet by mouth twice daily 10/30/20   [name], NP  triamcinolone cream (KENALOG) 0.1 % Apply 1 application topically 2 (two) times daily. 08/02/20   [provider]  colchicine 0.6 MG tablet Take 0.5 tablets (0.3 mg total) by mouth 2 (two) times daily. 07/24/20 07/29/20  Noemi Chapel, MD  ferrous sulfate 325 (65 FE) MG tablet SMARTSIG:1 Tablet(s) By Mouth Twice Daily 08/11/19 02/09/20  [provider]  furosemide (LASIX) 40 MG tablet Take 1 tablet (40 mg total) by mouth daily. 07/25/19 02/09/20  Ladona Horns, MD    Allergies    Dilaudid [hydromorphone hcl]  Review of Systems   Review of Systems  Constitutional:  Negative for fever.  Respiratory:  Positive for shortness of breath. Negative for cough.   Cardiovascular:  Negative for chest pain.  Gastrointestinal:  Negative for abdominal pain.  Musculoskeletal:  Negative for neck pain.  Neurological:  Positive for weakness. Negative for seizures, numbness and headaches.       Abnormal movements of b/l hands   Physical Exam Updated Vital Signs BP (!) 184/72   Pulse 94   Temp 97.6 F (36.4 C) (Oral)   Resp 15    SpO2 98%   Physical Exam Constitutional:      General: He is not in acute distress.    Appearance: He is obese.     Comments: Chronically ill appearing  HENT:     Head: Normocephalic and atraumatic.  Cardiovascular:     Rate and Rhythm: Normal rate and regular rhythm.  Pulmonary:     Effort: Pulmonary effort is normal.     Comments: Bibasilar crackles Abdominal:     Palpations: Abdomen is soft.     Tenderness: There is no abdominal tenderness.  Musculoskeletal:     Cervical back: Normal range of motion and neck supple.     Comments: 1+ nonpitting edema bilateral lower extremities  Skin:    Capillary Refill: Capillary refill takes less than 2 seconds.     Comments: Scattered hyperpigmented papules on bilateral forearms  Neurological:     General: No focal deficit present.     Mental Status: He is alert.     Comments: 5/5 strength throughout bilateral upper extremities. Intermittent irregular jerking movements of bilateral hands    ED Results / Procedures / Treatments   Labs (all labs ordered are listed, but only abnormal results are displayed) Labs Reviewed  COMPREHENSIVE METABOLIC PANEL - Abnormal; Notable for the following components:      Result Value   Glucose, Bld 186 (*)    BUN 70 (*)    Creatinine, Ser 13.36 (*)    Calcium 8.6 (*)    Albumin 2.5 (*)    AST 102 (*)    ALT 45 (*)    GFR, Estimated 4 (*)    Anion gap 18 (*)    All other components within normal limits  CBC WITH DIFFERENTIAL/PLATELET - Abnormal; Notable for the following components:   RBC 1.90 (*)    Hemoglobin 7.1 (*)    HCT 22.7 (*)    MCV 119.5 (*)    MCH 37.4 (*)    RDW 22.4 (*)    nRBC 3.5 (*)    Abs Immature Granulocytes 0.09 (*)    All other components within normal limits  BRAIN NATRIURETIC PEPTIDE - Abnormal; Notable for the following components:   B Natriuretic Peptide 193.9 (*)    All other components within normal limits  AMMONIA - Abnormal; Notable for the following  components:   Ammonia 56 (*)    All other components within normal limits  TROPONIN I (HIGH SENSITIVITY) - Abnormal; Notable for the following components:   Troponin I (High Sensitivity) 19 (*)    All other components within normal limits  TROPONIN I (HIGH SENSITIVITY)    EKG EKG Interpretation  Date/Time:  Sunday January 06 2021 17:59:03 EDT Ventricular Rate:  101 PR Interval:  172 QRS Duration: 82 QT Interval:  350 QTC Calculation: 453 R Axis:   -12 Text Interpretation: Sinus tachycardia Cannot rule out Anterior infarct , age undetermined Abnormal ECG When compared with ECG of 01/05/2021, QT has shortened Confirmed by Delora Fuel ([id number]) on 01/06/2021 11:34:00 PM  Radiology DG Chest 2 View  Result Date: 01/06/2021 CLINICAL DATA:  60 year old male with shortness of breath and weakness for 3 days. EXAM: CHEST - 2 VIEW COMPARISON:  January 05, 2021. FINDINGS: Image rotated slightly to the RIGHT. Accounting for this cardiomediastinal contours and hilar structures are unchanged with cardiac enlargement and central pulmonary vascular congestion. Mild interstitial prominence is similar to the prior study. Lung volumes are mildly diminished. No sign of effusion. No lobar consolidation. No pneumothorax. On limited assessment there is no acute skeletal process. IMPRESSION: Cardiomegaly with central pulmonary vascular congestion and mild interstitial prominence similar to the prior study. No signs of effusion or lobar consolidation. Electronically Signed   By: Zetta Bills M.D.   On: 01/06/2021 19:29   CT HEAD WO CONTRAST (5MM)  Result Date: 01/05/2021 CLINICAL DATA:  Head trauma, recent forgetfulness. EXAM: CT HEAD WITHOUT CONTRAST TECHNIQUE: Contiguous axial images were obtained from the base of the skull through the vertex without intravenous contrast. COMPARISON:  October 27, 2020 FINDINGS: Brain: No evidence of  acute infarction, hemorrhage, hydrocephalus, extra-axial collection or mass  lesion/mass effect. There is chronic diffuse atrophy. Small old lacunar infarctions are identified in bilateral basal ganglia and left thalamus unchanged. Vascular: No hyperdense vessel or unexpected calcification. Skull: Normal. Negative for fracture or focal lesion. Sinuses/Orbits: No acute finding. Other: None. IMPRESSION: 1. No focal acute intracranial abnormality identified. 2. Chronic diffuse atrophy. 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 Electronically Signed   By: Abelardo Diesel M.D.   On: 01/05/2021 12:56   CT Cervical Spine Wo Contrast  Result Date: 01/07/2021 CLINICAL DATA:  60 year old male with generalized weakness and recurrent falls. Bilateral hand pain, tingling, weakness. EXAM: CT CERVICAL SPINE WITHOUT CONTRAST TECHNIQUE: Multidetector CT imaging of the cervical spine was performed without intravenous contrast. Multiplanar CT image reconstructions were also generated. COMPARISON:  Head CT 01/05/2021 and earlier. FINDINGS: Alignment: Straightening with subtle reversal of cervical lordosis. Cervicothoracic junction alignment is within normal limits. Bilateral posterior element alignment is within normal limits. Skull base and vertebrae: Visualized skull base is intact. No atlanto-occipital dissociation. C1 and C2 are intact and aligned. No acute osseous abnormality identified. Soft tissues and spinal canal: No prevertebral fluid or swelling. No visible canal hematoma. Advanced calcified atherosclerosis in the neck. Partially retropharyngeal course of the left carotid. Disc levels: Intermittent calcified ligament flavum hypertrophy in the neck, most pronounced at C6-C7 (series 4, image 63). Advanced degeneration at the anterior C1-C2 articulation with small chronic ossific fragments at the tip of the clivus. C4-C5 and C6-C7 disc bulging with endplate spurring. Mild spinal stenosis suspected at the latter. Upper chest: Visible upper thoracic levels  appear intact. Calcified aortic atherosclerosis. Tortuous proximal great vessels with calcified plaque. Mild mediastinal lipomatosis. Negative upper lungs. Other: Bulky calcified distal vertebral artery atherosclerosis, otherwise negative visible noncontrast posterior fossa. Visible tympanic cavities and mastoids are clear. IMPRESSION: 1. No acute traumatic injury identified in the cervical spine. 2. Intermittent cervical spine degeneration, with mild spinal stenosis suspected at C4-C5 and C6-C7. 3. Generalized advanced calcified atherosclerosis suggesting End stage renal disease. Aortic Atherosclerosis (ICD10-I70.0). Electronically Signed   By: Genevie Ann M.D.   On: 01/07/2021 10:26   CT Knee Left Wo Contrast  Result Date: 01/05/2021 CLINICAL DATA:  Left knee pain.  Recent falls. EXAM: CT OF THE LEFT KNEE WITHOUT CONTRAST TECHNIQUE: Multidetector CT imaging of the left knee was performed according to the standard protocol. Multiplanar CT image reconstructions were also generated. COMPARISON:  Left knee x-rays from same day. FINDINGS: Bones/Joint/Cartilage No fracture or dislocation. Joint spaces are relatively preserved. Bone infarct in the proximal tibia. No joint effusion. Osteopenia. Ligaments Ligaments are suboptimally evaluated by CT. Muscles and Tendons Grossly intact. Soft tissue No fluid collection or hematoma.  No soft tissue mass. IMPRESSION: 1. No acute osseous abnormality. 2. Bone infarct in the proximal tibia. Electronically Signed   By: Titus Dubin M.D.   On: 01/05/2021 12:41   CT Knee Right Wo Contrast  Result Date: 01/05/2021 CLINICAL DATA:  Right knee pain.  Recent falls. EXAM: CT OF THE RIGHT KNEE WITHOUT CONTRAST TECHNIQUE: Multidetector CT imaging of the right knee was performed according to the standard protocol. Multiplanar CT image reconstructions were also generated. COMPARISON:  Right knee x-rays from same day. FINDINGS: Bones/Joint/Cartilage No fracture or dislocation. Joint  spaces are relatively preserved. Chronic bone infarcts in the distal femoral metaphysis and lateral femoral condyle. No joint effusion. Osteopenia. Ligaments Ligaments are suboptimally evaluated by CT. Muscles and Tendons Grossly intact.  Soft tissue No fluid collection or hematoma.  No soft tissue mass. IMPRESSION: 1. No acute osseous abnormality. 2. 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 Electronically Signed   By: Titus Dubin M.D.   On: 01/05/2021 12:45    Procedures Procedures   Medications Ordered in ED Medications - No data to display  ED Course  I have reviewed the triage vital signs and the nursing notes.  Pertinent labs & imaging results that were available during my care of the patient were reviewed by me and considered in my medical decision making (see chart for details).    MDM Rules/Calculators/A&P                         60 year old gentleman with hx of ESRD on HD T/Th/S, DM2, HTN, prior alcohol/cocaine use presents with abnormal hand movements bilaterally.  Symptoms fairly chronic (4 weeks) but are particularly bothersome to him. Described as "jumping" or "jerking" which cause him to drop things but no obvious weakness. On exam he is noted to have intermittent irregular twitching-like movements of bilateral hands. Also with bibasilar crackles and chronic skin changes on bilateral arms which he states is from dialysis. Remainder of exam, specifically his neuro exam is normal.  Differential includes asterixis from uremia and renal failure, although his exam is not typical for asterixis. Could also consider cervical spine pathology, medication side effect, peripheral neuropathy. Less likely CVA or intracranial abnormality given chronicity and normal head CT 2 days ago.  Initial workup with CBC, BMP, BNP and troponin were unremarkable other than his known renal failure (Cr 13.36, BUN 70) and chronic anemia (Hgb 7.1). CXR was obtained and shows pulmonary vascular congestion  but largely unchanged from prior. Will obtain ammonia level and CT cervical spine.  CT demonstrates mild spinal stenosis at C4-5 and C6-7 but no abnormality to explain his symptoms. Ammonia mildly elevated at 56.   Patient stable for discharge home with outpatient HD tomorrow. Suspect this is related to his renal failure although patient does not agree with this. Advised PCP and nephrology follow-up. He was also given contact info for neurology.  Final Clinical Impression(s) / ED Diagnoses Final diagnoses:  Abnormal movements  Weakness of both hands    Rx / DC Orders ED Discharge Orders     None      Alcus Dad, MD PGY-2 [facility] Family Medicine   Alcus Dad, MD 01/07/21 1327    Blanchie Dessert, MD 01/08/21 [phone]

## 2021-01-07 NOTE — Discharge Instructions (Addendum)
You were seen in the Emergency Department for weakness and abnormal movements in your hands.  It may be related to your kidney failure and dialysis or possibly a medication side effect.  The CT scan of your neck did not reveal a cause of these movements. You also had a normal scan of your brain 2 days ago. Please follow up with your primary care doctor and nephrologist (kidney specialist). It may also be helpful to see a neurologist.

## 2021-01-07 NOTE — ED Notes (Signed)
Got patient on the monitor patient is resting with call bell in reach  ?

## 2021-01-08 LAB — PATHOLOGIST SMEAR REVIEW

## 2021-01-10 ENCOUNTER — Encounter (HOSPITAL_COMMUNITY): Payer: Self-pay | Admitting: Emergency Medicine

## 2021-01-10 ENCOUNTER — Inpatient Hospital Stay (HOSPITAL_COMMUNITY)
Admission: EM | Admit: 2021-01-10 | Discharge: 2021-01-13 | DRG: 377 | Disposition: A | Discharge status: Home / Self Care | Payer: Medicare Other | Attending: Internal Medicine | Admitting: Internal Medicine

## 2021-01-10 ENCOUNTER — Emergency Department (HOSPITAL_COMMUNITY): Payer: Medicare Other

## 2021-01-10 ENCOUNTER — Other Ambulatory Visit: Payer: Self-pay

## 2021-01-10 DIAGNOSIS — E1122 Type 2 diabetes mellitus with diabetic chronic kidney disease: Secondary | ICD-10-CM | POA: Diagnosis present

## 2021-01-10 DIAGNOSIS — D500 Iron deficiency anemia secondary to blood loss (chronic): Secondary | ICD-10-CM | POA: Diagnosis present

## 2021-01-10 DIAGNOSIS — M898X9 Other specified disorders of bone, unspecified site: Secondary | ICD-10-CM | POA: Diagnosis present

## 2021-01-10 DIAGNOSIS — K2970 Gastritis, unspecified, without bleeding: Secondary | ICD-10-CM | POA: Diagnosis present

## 2021-01-10 DIAGNOSIS — F1021 Alcohol dependence, in remission: Secondary | ICD-10-CM | POA: Diagnosis present

## 2021-01-10 DIAGNOSIS — Z885 Allergy status to narcotic agent status: Secondary | ICD-10-CM

## 2021-01-10 DIAGNOSIS — E119 Type 2 diabetes mellitus without complications: Secondary | ICD-10-CM

## 2021-01-10 DIAGNOSIS — R4182 Altered mental status, unspecified: Secondary | ICD-10-CM | POA: Diagnosis not present

## 2021-01-10 DIAGNOSIS — K254 Chronic or unspecified gastric ulcer with hemorrhage: Secondary | ICD-10-CM | POA: Diagnosis not present

## 2021-01-10 DIAGNOSIS — Z6836 Body mass index (BMI) 36.0-36.9, adult: Secondary | ICD-10-CM

## 2021-01-10 DIAGNOSIS — D631 Anemia in chronic kidney disease: Secondary | ICD-10-CM | POA: Diagnosis present

## 2021-01-10 DIAGNOSIS — K2270 Barrett's esophagus without dysplasia: Secondary | ICD-10-CM | POA: Diagnosis present

## 2021-01-10 DIAGNOSIS — R531 Weakness: Secondary | ICD-10-CM

## 2021-01-10 DIAGNOSIS — L739 Follicular disorder, unspecified: Secondary | ICD-10-CM | POA: Diagnosis present

## 2021-01-10 DIAGNOSIS — B182 Chronic viral hepatitis C: Secondary | ICD-10-CM | POA: Diagnosis present

## 2021-01-10 DIAGNOSIS — I10 Essential (primary) hypertension: Secondary | ICD-10-CM | POA: Diagnosis present

## 2021-01-10 DIAGNOSIS — Z8711 Personal history of peptic ulcer disease: Secondary | ICD-10-CM

## 2021-01-10 DIAGNOSIS — F1020 Alcohol dependence, uncomplicated: Secondary | ICD-10-CM

## 2021-01-10 DIAGNOSIS — D509 Iron deficiency anemia, unspecified: Secondary | ICD-10-CM | POA: Diagnosis present

## 2021-01-10 DIAGNOSIS — D649 Anemia, unspecified: Secondary | ICD-10-CM

## 2021-01-10 DIAGNOSIS — I120 Hypertensive chronic kidney disease with stage 5 chronic kidney disease or end stage renal disease: Secondary | ICD-10-CM | POA: Diagnosis present

## 2021-01-10 DIAGNOSIS — F1411 Cocaine abuse, in remission: Secondary | ICD-10-CM | POA: Diagnosis present

## 2021-01-10 DIAGNOSIS — R195 Other fecal abnormalities: Secondary | ICD-10-CM

## 2021-01-10 DIAGNOSIS — Z992 Dependence on renal dialysis: Secondary | ICD-10-CM

## 2021-01-10 DIAGNOSIS — Z20822 Contact with and (suspected) exposure to covid-19: Secondary | ICD-10-CM | POA: Diagnosis present

## 2021-01-10 DIAGNOSIS — K922 Gastrointestinal hemorrhage, unspecified: Secondary | ICD-10-CM | POA: Diagnosis present

## 2021-01-10 DIAGNOSIS — N186 End stage renal disease: Secondary | ICD-10-CM | POA: Diagnosis present

## 2021-01-10 DIAGNOSIS — R7401 Elevation of levels of liver transaminase levels: Secondary | ICD-10-CM | POA: Diagnosis present

## 2021-01-10 DIAGNOSIS — F17210 Nicotine dependence, cigarettes, uncomplicated: Secondary | ICD-10-CM | POA: Diagnosis present

## 2021-01-10 DIAGNOSIS — Z79899 Other long term (current) drug therapy: Secondary | ICD-10-CM

## 2021-01-10 LAB — COMPREHENSIVE METABOLIC PANEL
ALT: 57 U/L — ABNORMAL HIGH (ref 0–44)
AST: 126 U/L — ABNORMAL HIGH (ref 15–41)
Albumin: 2.6 g/dL — ABNORMAL LOW (ref 3.5–5.0)
Alkaline Phosphatase: 82 U/L (ref 38–126)
Anion gap: 13 (ref 5–15)
BUN: 20 mg/dL (ref 8–23)
CO2: 31 mmol/L (ref 22–32)
Calcium: 7.9 mg/dL — ABNORMAL LOW (ref 8.9–10.3)
Chloride: 95 mmol/L — ABNORMAL LOW (ref 98–111)
Creatinine, Ser: 5.59 mg/dL — ABNORMAL HIGH (ref 0.61–1.24)
GFR, Estimated: 11 mL/min — ABNORMAL LOW (ref >60)
Glucose, Bld: 92 mg/dL (ref 70–99)
Potassium: 3.5 mmol/L (ref 3.5–5.1)
Sodium: 139 mmol/L (ref 135–145)
Total Bilirubin: 0.7 mg/dL (ref 0.3–1.2)
Total Protein: 7.7 g/dL (ref 6.5–8.1)

## 2021-01-10 LAB — CBC
HCT: 24.3 % — ABNORMAL LOW (ref 39.0–52.0)
Hemoglobin: 7.7 g/dL — ABNORMAL LOW (ref 13.0–17.0)
MCH: 37 pg — ABNORMAL HIGH (ref 26.0–34.0)
MCHC: 31.7 g/dL (ref 30.0–36.0)
MCV: 116.8 fL — ABNORMAL HIGH (ref 80.0–100.0)
Platelets: 124 10*3/uL — ABNORMAL LOW (ref 150–400)
RBC: 2.08 MIL/uL — ABNORMAL LOW (ref 4.22–5.81)
RDW: 21.4 % — ABNORMAL HIGH (ref 11.5–15.5)
WBC: 4.7 10*3/uL (ref 4.0–10.5)
nRBC: 3.4 % — ABNORMAL HIGH (ref 0.0–0.2)

## 2021-01-10 LAB — ETHANOL: Alcohol, Ethyl (B): <10 mg/dL (ref <10)

## 2021-01-10 LAB — HEMOGLOBIN AND HEMATOCRIT, BLOOD
HCT: 25.4 % — ABNORMAL LOW (ref 39.0–52.0)
Hemoglobin: 8.1 g/dL — ABNORMAL LOW (ref 13.0–17.0)

## 2021-01-10 LAB — SARS CORONAVIRUS 2 (TAT 6-24 HRS): SARS Coronavirus 2: NEGATIVE

## 2021-01-10 LAB — POC OCCULT BLOOD, ED: Fecal Occult Bld: POSITIVE — AB

## 2021-01-10 LAB — CBG MONITORING, ED: Glucose-Capillary: 103 mg/dL — ABNORMAL HIGH (ref 70–99)

## 2021-01-10 LAB — PREPARE RBC (CROSSMATCH)

## 2021-01-10 LAB — AMMONIA: Ammonia: 34 umol/L (ref 9–35)

## 2021-01-10 MED ORDER — INSULIN ASPART 100 UNIT/ML IJ SOLN
0.0000 [IU] | Freq: Three times a day (TID) | INTRAMUSCULAR | Status: DC
  Administered 2021-01-12: 1 [IU] via SUBCUTANEOUS

## 2021-01-10 MED ORDER — SODIUM CHLORIDE 0.9 % IV SOLN: 250.0000 mL | INTRAVENOUS | Status: DC | PRN

## 2021-01-10 MED ORDER — SODIUM CHLORIDE 0.9% FLUSH
3.0000 mL | INTRAVENOUS | Status: DC | PRN
  Administered 2021-01-13: 3 mL via INTRAVENOUS

## 2021-01-10 MED ORDER — SODIUM CHLORIDE 0.9 % IV SOLN: 10.0000 mL/h | Freq: Once | INTRAVENOUS | Status: DC

## 2021-01-10 MED ORDER — SODIUM CHLORIDE 0.9% FLUSH
3.0000 mL | Freq: Two times a day (BID) | INTRAVENOUS | Status: DC
  Administered 2021-01-10 – 2021-01-13 (×5): 3 mL via INTRAVENOUS

## 2021-01-10 MED ORDER — NICOTINE 21 MG/24HR TD PT24
21.0000 mg | MEDICATED_PATCH | Freq: Every day | TRANSDERMAL | Status: DC
  Administered 2021-01-10 – 2021-01-13 (×4): 21 mg via TRANSDERMAL
  Filled 2021-01-10 (×4): qty 1

## 2021-01-10 NOTE — ED Notes (Signed)
Patient transported to CT 

## 2021-01-10 NOTE — ED Triage Notes (Signed)
Pt arrived via [facility] from the dialysis ctr for AMS. Pt had AMS prior to tx and the staff thought the AMS was due to needing dialysis, but after pt finished the tx, Pt was still altered. Per EMS pt initial bp was 88/56. EMS gave NS 168mL. Pt is A&Ox4, but slow to answer. Pt goes to dialysis Tues, Thurs, Sat.

## 2021-01-10 NOTE — ED Notes (Signed)
Provided pt with [place] bag and cup of ice water

## 2021-01-10 NOTE — ED Provider Notes (Signed)
5:31 PM Care of the patient assumed at signout.  Patient is in no distress.  He is aware of indication for transfusion, and is amenable to it.  He is aware of need for admission.  I have enlisted the help of his gastroenterology team for consult as well. ([place]).   Carmin Muskrat, MD 01/10/21 1731

## 2021-01-10 NOTE — H&P (Addendum)
History and Physical    AJENE CARCHI XFG:[id number] DOB: [birth year] DOA: 01/10/2021  PCP: [name], NP Consultants:  nephrology: [facility].  Patient coming from:  Home - lives with his cousin    Chief Complaint: not feeling well/confusion   HPI: Daniel [name] is a 60 y.o. male with medical history significant of  ESRD (Tues, Thurs, Saturday), hypertension, T2DM, multifactorial anemia (with recent bleeding duodenal ulcer 07/2020), penile calciphylaxis, alcohol abuse in remission, cocaine abuse in remission and nicotine dependence who presents to ED because he felt bad. He was at dialysis today and finished his session and he just  bad.  He felt like he was confused and "talking out of his head." He had some dizziness as well. He also had some shortness of breath, but this has resolved. He is no longer confused or feeling like he is talking out of his head. He does feel like he is more forgetful at times. Hx of alcohol abuse and cocaine abuse in remission. He does drink a beer from time to time.   He had a blood transfusion on 12/28/20.  He states his stools were dark/black in color. NO blood in stool. He states this has been going on for a while.   He denies fever/chills, upper respiratory symptoms, chest pain, palpitations, N/V/D, stomach pain, no dysuria, leg swelling, skin lesion or rashes.   Last colonoscopy: 06/2019: no active source of bleed EGD: 4/22-duodenal ulcer with bleeding, 3 clips placed.  No family history of colon cancer.   ED Course: vitals: Afebrile, blood pressure 115/73, heart rate 79, respiratory rate 18, oxygen 97% on room air. Pertinent labs: Hemoglobin 7.7, platelets 124, creatinine 5.59, calcium 7.9, AST 126, ALT 57, fecal occult positive, CT head with no acute finding, 1 unit of blood ordered by ED physician.  We were called and asked to admit.   Review of Systems: As per HPI; otherwise review of systems reviewed and negative.   Ambulatory  Status:  Ambulates without assistance    Past Medical History:  Diagnosis Date   Anemia    Diabetes mellitus without complication [facility])    patient denies   Dialysis patient [facility])    End stage chronic kidney disease ([place])    Hypertension    ICH (intracerebral hemorrhage) ([place]) 05/20/2017   Shoulder pain, left 06/28/2013    Past Surgical History:  Procedure Laterality Date   A/V FISTULAGRAM N/A 08/15/2020   Procedure: A/V FISTULAGRAM - Left Upper;  Surgeon: Cherre Robins, MD;  Location: [place] CV LAB;  Service: Cardiovascular;  Laterality: N/A;   APPENDECTOMY     AV FISTULA PLACEMENT Left 08/03/2019   Procedure: LEFT ARM ARTERIOVENOUS (AV) CEPHALIC  FISTULA CREATION;  Surgeon: Waynetta Sandy, MD;  Location: [place];  Service: Vascular;  Laterality: Left;   BIOPSY  06/30/2019   Procedure: BIOPSY;  Surgeon: Ronald Lobo, MD;  Location: [place];  Service: Endoscopy;;   BIOPSY  08/02/2019   Procedure: BIOPSY;  Surgeon: Yetta Flock, MD;  Location: [place];  Service: Gastroenterology;;   COLONOSCOPY  01/23/2012   Procedure: COLONOSCOPY;  Surgeon: Danie Binder, MD;  [facility];  Service: Endoscopy;  Laterality: N/A;  11:10 AM   COLONOSCOPY WITH PROPOFOL N/A 06/30/2019   Procedure: COLONOSCOPY WITH PROPOFOL;  Surgeon: Ronald Lobo, MD;  Location: [place];  Service: Endoscopy;  Laterality: N/A;   ENTEROSCOPY N/A 08/02/2019   Procedure: ENTEROSCOPY;  Surgeon: Yetta Flock, MD;  Location:  [place] END[facility]COPY;  Service: Gastroenterology;  Laterality: N/A;   ESOPHAGOGASTRODUODENOSCOPY N/A 08/10/2020   Procedure: ESOPHAGOGASTRODUODENOSCOPY (EGD);  Surgeon: Jerene Bears, MD;  Location[facility] ENDOSCOPY;  Service: Gastroenterology;  Laterality: N/A;   ESOPHAGOGASTRODUODENOSCOPY (EGD) WITH PROPOFOL N/A 06/30/2019   Procedure: ESOPHAGOGASTRODUODENOSCOPY (EGD) WITH PROPOFOL;  Surgeon: Ronald Lobo, MD;  Location: [place];  Service: Endoscopy;   Laterality: N/A;   FISTULA SUPERFICIALIZATION Left 10/17/2019   Procedure: LEFT UPPER EXTREMITY FISTULA REVISION, SIDE BRANCH LIGATION,  AND SUPERFICIALIZATION;  Surgeon: Marty Heck, MD;  Location: [place];  Service: Vascular;  Laterality: Left;   GIVENS CAPSULE STUDY N/A 06/30/2019   Procedure: GIVENS CAPSULE STUDY;  Surgeon: Ronald Lobo, MD;  Location: [place];  Service: Endoscopy;  Laterality: N/A;   HEMOSTASIS CLIP PLACEMENT  08/10/2020   Procedure: HEMOSTASIS CLIP PLACEMENT;  Surgeon: Jerene Bears, MD;  Location: [place] ENDOSCOPY;  Service: Gastroenterology;;   HEMOSTASIS CONTROL  08/02/2019   Procedure: HEMOSTASIS CONTROL;  Surgeon: Yetta Flock, MD;  Location: [place] ENDOSCOPY;  Service: Gastroenterology;;   INCISION AND DRAINAGE ABSCESS N/A 06/29/2016   Procedure: INCISION AND DRAINAGE ABDOMINAL WALL ABSCESS;  Surgeon: Alphonsa Overall, MD;  Location: [facility];  Service: General;  Laterality: N/A;   INSERTION OF DIALYSIS CATHETER Right 08/03/2019   Procedure: INSERTION OF DIALYSIS CATHETER;  Surgeon: Waynetta Sandy, MD;  Location: [place];  Service: Vascular;  Laterality: Right;   INSERTION OF DIALYSIS CATHETER Right 10/22/2019   Procedure: INSERTION OF 23CM TUNNELED DIALYSIS CATHETER RIGHT INTERNAL JUGULAR;  Surgeon: Angelia Mould, MD;  Location: [place];  Service: Vascular;  Laterality: Right;   INSERTION OF DIALYSIS CATHETER Right 08/12/2020   Procedure: INSERTION OF Right internal Jugular TUNNELED  DIALYSIS CATHETER.;  Surgeon: Waynetta Sandy, MD;  Location: [place];  Service: Vascular;  Laterality: Right;   Left heel surgery     PENILE BIOPSY N/A 03/26/2020   Procedure: PENILE ULCER DEBRIDEMENT;  Surgeon: Remi Haggard, MD;  [facility];  Service: Urology;  Laterality: N/A;  30 MINS    Social History   Socioeconomic History   Marital status: Single    Spouse name: Not on file   Number of children: Not on file   Years of education: Not on file    Highest education level: Not on file  Occupational History   Not on file  Tobacco Use   Smoking status: Every Day    Packs/day: 1.00    Years: 30.00    Pack years: 30.00    Types: Cigarettes   Smokeless tobacco: Never  Vaping Use   Vaping Use: Never used  Substance and Sexual Activity   Alcohol use: Not Currently    Alcohol/week: 12.0 standard drinks    Types: 12 Cans of beer per week   Drug use: Not Currently    Types: "Crack" cocaine    Comment: last in 2020   Sexual activity: Yes    Birth control/protection: None  Other Topics Concern   Not on file  Social History Narrative   Not on file   Social Determinants of Health   Financial Resource Strain: Not on file  Food Insecurity: Not on file  Transportation Needs: Not on file  Physical Activity: Not on file  Stress: Not on file  Social Connections: Not on file  Intimate Partner Violence: Not on file    Allergies  Allergen Reactions   Dilaudid [Hydromorphone Hcl] Itching and Other (See Comments)    Pt reports itchiness  after IM injection     Family History  Problem Relation Age of Onset   Colon cancer Neg Hx     Prior to Admission medications   Medication Sig Start Date End Date Taking? Authorizing Provider  amLODipine (NORVASC) 10 MG tablet Take 10 mg by mouth at bedtime. 01/01/21   [provider]  augmented betamethasone dipropionate (DIPROLENE-AF) 0.05 % cream Apply 1 application topically 2 (two) times daily as needed. 01/03/21   [provider]  bacitracin ointment Apply 1 application topically 2 (two) times daily. 01/05/21   Regan Lemming, MD  blood glucose meter kit and supplies KIT Dispense based on patient and insurance preference. Use up to four times daily as directed. (FOR ICD-9 250.00, 250.01). 07/01/16   Nita Sells, MD  Calcium Acetate 667 MG TABS Take 1,334 mg by mouth 4 (four) times daily. 09/11/20   [provider]  cetirizine (ZYRTEC ALLERGY) 10 MG tablet Take 1  tablet (10 mg total) by mouth daily as needed for allergies. 08/22/20   [name], NP  cloNIDine (CATAPRES) 0.1 MG tablet SMARTSIG:1 Tablet(s) By Mouth 01/01/21   [provider]  cloNIDine (CATAPRES) 0.2 MG tablet Take  1 tablet twice daily . 10/28/20   Donnamae Jude, MD  doxepin (SINEQUAN) 10 MG capsule Take 10 mg by mouth at bedtime. 01/03/21   [provider]  gabapentin (NEURONTIN) 100 MG capsule Take 1 capsule (100 mg total) by mouth at bedtime. 08/16/20   Florencia Reasons, MD  hydrOXYzine (ATARAX/VISTARIL) 25 MG tablet TAKE 1 TABLET BY MOUTH EVERY 8 HOURS AS NEEDED FOR ANXIETY OR  ITCHING 11/07/20   [name], NP  lidocaine-prilocaine (EMLA) cream APPLY SMALL AMOUNT TO ACCESS SITE (AVF) 1 TO 2 HOURS BEFORE DIALYSIS. COVER WITH OCCLUSIVE DRESSING (SARAN WRAP) 12/27/20   [provider]  magic mouthwash (nystatin, lidocaine, diphenhydrAMINE, alum & mag hydroxide) suspension Swish and spit 5 mLs 4 (four) times daily as needed for mouth pain. 12/12/20   Hazel Sams, PA-C  nitroGLYCERIN (NITRODUR - DOSED IN MG/24 HR) 0.2 mg/hr patch Use 1/4 patch daily to the affected area 12/12/20   Hudnall, Sharyn Lull, MD  nystatin (MYCOSTATIN) 100000 UNIT/ML suspension Take by mouth. 12/12/20   [provider]  oxyCODONE-acetaminophen (PERCOCET/ROXICET) 5-325 MG tablet Take 1 tablet by mouth every 8 (eight) hours as needed for severe pain. 11/12/20   Jaynee Eagles, PA-C  pantoprazole (PROTONIX) 40 MG tablet Take 1 tablet by mouth twice daily 10/30/20   [name], NP  triamcinolone cream (KENALOG) 0.1 % Apply 1 application topically 2 (two) times daily. 08/02/20   [provider]  colchicine 0.6 MG tablet Take 0.5 tablets (0.3 mg total) by mouth 2 (two) times daily. 07/24/20 07/29/20  Noemi Chapel, MD  ferrous sulfate 325 (65 FE) MG tablet SMARTSIG:1 Tablet(s) By Mouth Twice Daily 08/11/19 02/09/20  [provider]  furosemide (LASIX) 40 MG tablet Take 1 tablet  (40 mg total) by mouth daily. 07/25/19 02/09/20  Ladona Horns, MD    Physical Exam: Vitals:   01/10/21 1600 01/10/21 1806 01/10/21 1821 01/10/21 1830  BP: 131/70 117/75 114/74 113/64  Pulse: 80 78 78 77  Resp: 13 17 15  (!) 24  Temp:  98.1 F (36.7 C) 98.3 F (36.8 C)   TempSrc:  Oral Oral   SpO2: 100%  100% 99%  Weight:      Height:         General:  Appears calm and  comfortable and is in NAD Eyes:  PERRL, EOMI, normal lids, iris ENT:  grossly normal hearing, lips & tongue, mmm; missing some teeth  Neck:  no LAD, masses or thyromegaly; no carotid bruits Cardiovascular:  RRR, no m/r/g. Trace bilateral LE edema  Respiratory:   CTA bilaterally with no wheezes/rales/rhonchi.  Normal respiratory effort. Abdomen:  soft, distended. NT. BS+ Back:   normal alignment, no CVAT Skin:  maculopapular lesions over bilateral lower legs and distal arms.  Musculoskeletal:  grossly normal tone BUE/BLE, good ROM, no bony abnormality Lower extremity:  Limited foot exam with no ulcerations.  2+ distal pulses. Psychiatric:  grossly normal mood and affect, speech fluent and appropriate, AOx3 Neurologic:  CN 2-12 grossly intact, moves all extremities in coordinated fashion, sensation intact    Radiological Exams on Admission: Independently reviewed - see discussion in A/P where applicable  CT HEAD WO CONTRAST (5MM)  Result Date: 01/10/2021 CLINICAL DATA:  Altered mental status. EXAM: CT HEAD WITHOUT CONTRAST TECHNIQUE: Contiguous axial images were obtained from the base of the skull through the vertex without intravenous contrast. COMPARISON:  January 05, 2021. FINDINGS: Brain: No evidence of acute infarction, hemorrhage, hydrocephalus, extra-axial collection or mass lesion/mass effect. Vascular: No hyperdense vessel or unexpected calcification. Skull: Normal. Negative for fracture or focal lesion. Sinuses/Orbits: No acute finding. Other: None. IMPRESSION: No acute intracranial abnormality seen.  Electronically Signed   By: Marijo Conception M.D.   On: 01/10/2021 15:17    EKG: Independently reviewed.  NSR with rate 80; nonspecific ST changes with no evidence of acute ischemia. Reading as atrial flutter with 3:1 block; however, looks like artifact. Repeat ekg pending    Labs on Admission: I have personally reviewed the available labs and imaging studies at the time of the admission.  Pertinent labs:  Hemoglobin 7.7,  platelets 124,  creatinine 5.59,  calcium 7.9,  AST 126,  ALT 57,  fecal occult positive,    Assessment/Plan Principal Problem:   Symptomatic anemia secondary to GI bleed 60 year old male presenting with symptomatic anemia, positive fecal occult and known duodenal ulcer in April 2022. -Received blood transfusion on 12/28/20 Hgb: 7.8 (9/10)->7.1 (9/11)-->7.7 today -Received 1 unit of blood in ED -GI consulted in ED and will see in the a.m. - clear liquid diet and made n.p.o. at midnight -Trend H&H's overnight every 4 hours -Transfuse to keep hemoglobin greater than 7. -Continue home Protonix  Active Problems: Transaminitis -History of elevated AST however has been trending upwards over the last 2 months. -states he has a beer every now and again no longer drinks daily like he used to -Checking ethanol level -Acute hepatitis panel.  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  Confusion Had complaints of being confused and talking out of his head at dialysis earlier today however this seems to be resolved on my exam.  He is alert and oriented.  CT head with no acute findings -Did have elevated ammonia on 9/12 when seen in ER so we will repeat this as this could be contributing to his confusion. -No signs of infection, I do have a RPR pending (rash +RPR In 2020 and unsure if treated)     Essential hypertension -Patient unsure of what medication he takes on a daily basis and history of noncompliance -He Has not had any medication  (for a few days or more)  -Blood pressures are soft -Has both clonidine 0.1 tablet as well as clonidine 0.2  mg tablet in med rec.  on last hospital discharge in July he was discharged on clonidine 0.2 mg twice daily however he states he has not taken these in 4 or more days and then states he just got a prescription for it so unsure if he is even taking this -Amlodipine also in the chart but unsure if taking -Blood pressure soft we will follow readings and adjust medication as needed    DM2 (diabetes mellitus, type 2) ([place]) Appears to be diet controlled A1c of 5.4 in June 2022 Sensitive sliding scale insulin and per protocol Accu-Cheks    ESRD on hemodialysis [facility]) -Completed his dialysis session today -We will monitor intake and output -Nephrology will need to be consulted if patient continues hospital stay into Saturday for dialysis session  Tobacco abuse Nicotine patch Encouraged cessation    Skin rash Characteristic of perforating folliculitis especially in setting of CKD.  Check RPR    Body mass index is 36.96 kg/m.   Level of care: Progressive DVT prophylaxis:  SCDs Code Status:  Full - confirmed with patient Family Communication: None present Disposition Plan:  The patient is from: home  Anticipated d/c is to: home  Requires inpatient hospitalization and is at significant risk of worsening, requires constant monitoring, assessment and MDM with specialists.  Patient is currently: acutely ill Consults called: GI   Admission status:  observation   Dragon dictation used in completing this note.    Orma Flaming MD [facility]   How to contact the [facility] Attending or Consulting provider [place] or covering provider during after hours [place], for this patient?  Check the care team in [facility] and look for a) attending/consulting [facility] provider listed and b) the [facility] team listed Log into [email or web address] and use [place] universal password to access. If you do not have  the password, please contact the hospital operator. Locate the [facility] provider you are looking for under [facility] and page to a number that you can be directly reached. If you still have difficulty reaching the provider, please page the [facility] (Director on Call) for the Hospitalists listed on amion for assistance.   01/10/2021, 7:33 PM

## 2021-01-10 NOTE — ED Provider Notes (Addendum)
[facility] EMERGENCY DEPARTMENT Provider Note   CSN: [id number] Arrival date & time: 01/10/21  1231     History Chief Complaint  Patient presents with   Altered Mental Status    Daniel [name] is a 60 y.o. male.  Patient w hx esrd/hd t/th/sa, was at dialysis today, and noted to be generally weak ?altered/confused - dialysis staff felt he may just need dialysis, so was dialyzed but symptoms persisted, so sent to ED.  Notes lightheaded/generally weak when stands.  Symptoms acute onset today, moderate, constant, persistent. Pt had his normal dialysis, and afterwards bp mildly low, 88/56. EMS gave small NS bolus and bp normal. Patient denies any chest pain or discomfort. No sob or unusual doe. No cough or uri symptoms. No headache. No focal or unilateral numbness or weakness. No change in speech or vision. No dysuria or gu c/o - states at baseline urinates once day. Normal appetite, although hasnt eaten/drank today. Denies change in meds or taking any meds today. No fever or chills. No recent blood loss or rectal bleeding/melena -  but states recently required transfusion and was told some blood in stool.   The history is provided by the patient, medical records and the EMS personnel.  Altered Mental Status Presenting symptoms: confusion   Associated symptoms: light-headedness and weakness   Associated symptoms: no abdominal pain, no fever, no headaches, no rash and no vomiting       Past Medical History:  Diagnosis Date   Anemia    Diabetes mellitus without complication [facility])    patient denies   Dialysis patient [facility])    End stage chronic kidney disease ([place])    Hypertension    ICH (intracerebral hemorrhage) ([place]) 05/20/2017   Shoulder pain, left 06/28/2013    Patient Active Problem List   Diagnosis Date Noted   Pain in joint of right shoulder 11/14/2020   Right hip pain 11/14/2020   Seizure-like activity ([place]) 10/27/2020   History of alcohol abuse  10/27/2020   History of cocaine abuse ([place]) 10/27/2020   Nicotine dependence, cigarettes, uncomplicated 12/09/4816   Fever 10/27/2020   Acute GI bleeding 08/10/2020   Gastrointestinal hemorrhage with melena    Duodenal ulcer with hemorrhage    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   ESRD on hemodialysis ([place])    AVM (arteriovenous malformation) of small bowel, acquired with hemorrhage    Symptomatic anemia 07/31/2019   Acute on chronic anemia 07/23/2019   Chronic kidney disease, stage V ([place])    Chronic hepatitis C without hepatic coma ([place]) 07/11/2019   Iron deficiency anemia 03/30/2019   CKD (chronic kidney disease), stage V ([place]) 03/30/2019   Alcohol use disorder 03/30/2019   B12 deficiency 03/30/2019   Orthostatic hypotension 01/22/2019   Anemia    Essential hypertension 06/29/2016   DM2 (diabetes mellitus, type 2) ([place]) 06/29/2016    Past Surgical History:  Procedure Laterality Date   A/V FISTULAGRAM N/A 08/15/2020   Procedure: A/V FISTULAGRAM - Left Upper;  Surgeon: Cherre Robins, MD;  Location: [place] CV LAB;  Service: Cardiovascular;  Laterality: N/A;   APPENDECTOMY     AV FISTULA PLACEMENT Left 08/03/2019   Procedure: LEFT ARM ARTERIOVENOUS (AV) CEPHALIC  FISTULA CREATION;  Surgeon: Waynetta Sandy, MD;  Location: [place];  Service: Vascular;  Laterality: Left;   BIOPSY  06/30/2019   Procedure: BIOPSY;  Surgeon: Ronald Lobo, MD;  Location: [place];  Service: Endoscopy;;   BIOPSY  08/02/2019  Procedure: BIOPSY;  Surgeon: Yetta Flock, MD;  Location: [place];  Service: Gastroenterology;;   COLONOSCOPY  01/23/2012   Procedure: COLONOSCOPY;  Surgeon: Danie Binder, MD;  [facility];  Service: Endoscopy;  Laterality: N/A;  11:10 AM   COLONOSCOPY WITH PROPOFOL N/A 06/30/2019   Procedure: COLONOSCOPY WITH PROPOFOL;  Surgeon: Ronald Lobo, MD;  Location: [place];  Service: Endoscopy;  Laterality: N/A;   ENTEROSCOPY  N/A 08/02/2019   Procedure: ENTEROSCOPY;  Surgeon: Yetta Flock, MD;  Location: [place] ENDOSCOPY;  Service: Gastroenterology;  Laterality: N/A;   ESOPHAGOGASTRODUODENOSCOPY N/A 08/10/2020   Procedure: ESOPHAGOGASTRODUODENOSCOPY (EGD);  Surgeon: Jerene Bears, MD;  Location[facility];  Service: Gastroenterology;  Laterality: N/A;   ESOPHAGOGASTRODUODENOSCOPY (EGD) WITH PROPOFOL N/A 06/30/2019   Procedure: ESOPHAGOGASTRODUODENOSCOPY (EGD) WITH PROPOFOL;  Surgeon: Ronald Lobo, MD;  Location: [place];  Service: Endoscopy;  Laterality: N/A;   FISTULA SUPERFICIALIZATION Left 10/17/2019   Procedure: LEFT UPPER EXTREMITY FISTULA REVISION, SIDE BRANCH LIGATION,  AND SUPERFICIALIZATION;  Surgeon: Marty Heck, MD;  Location: [place];  Service: Vascular;  Laterality: Left;   GIVENS CAPSULE STUDY N/A 06/30/2019   Procedure: GIVENS CAPSULE STUDY;  Surgeon: Ronald Lobo, MD;  Location: [place];  Service: Endoscopy;  Laterality: N/A;   HEMOSTASIS CLIP PLACEMENT  08/10/2020   Procedure: HEMOSTASIS CLIP PLACEMENT;  Surgeon: Jerene Bears, MD;  Location: [place] ENDOSCOPY;  Service: Gastroenterology;;   HEMOSTASIS CONTROL  08/02/2019   Procedure: HEMOSTASIS CONTROL;  Surgeon: Yetta Flock, MD;  Location: [place] ENDOSCOPY;  Service: Gastroenterology;;   INCISION AND DRAINAGE ABSCESS N/A 06/29/2016   Procedure: INCISION AND DRAINAGE ABDOMINAL WALL ABSCESS;  Surgeon: Alphonsa Overall, MD;  Location: [facility];  Service: General;  Laterality: N/A;   INSERTION OF DIALYSIS CATHETER Right 08/03/2019   Procedure: INSERTION OF DIALYSIS CATHETER;  Surgeon: Waynetta Sandy, MD;  Location: [place];  Service: Vascular;  Laterality: Right;   INSERTION OF DIALYSIS CATHETER Right 10/22/2019   Procedure: INSERTION OF 23CM TUNNELED DIALYSIS CATHETER RIGHT INTERNAL JUGULAR;  Surgeon: Angelia Mould, MD;  Location: [place];  Service: Vascular;  Laterality: Right;   INSERTION OF DIALYSIS CATHETER Right 08/12/2020    Procedure: INSERTION OF Right internal Jugular TUNNELED  DIALYSIS CATHETER.;  Surgeon: Waynetta Sandy, MD;  Location: [place];  Service: Vascular;  Laterality: Right;   Left heel surgery     PENILE BIOPSY N/A 03/26/2020   Procedure: PENILE ULCER DEBRIDEMENT;  Surgeon: Remi Haggard, MD;  Location: [facility];  Service: Urology;  Laterality: N/A;  40 MINS       Family History  Problem Relation Age of Onset   Colon cancer Neg Hx     Social History   Tobacco Use   Smoking status: Every Day    Packs/day: 1.00    Years: 30.00    Pack years: 30.00    Types: Cigarettes   Smokeless tobacco: Never  Vaping Use   Vaping Use: Never used  Substance Use Topics   Alcohol use: Not Currently    Alcohol/week: 12.0 standard drinks    Types: 12 Cans of beer per week   Drug use: Not Currently    Types: "Crack" cocaine    Comment: last in 2020    Home Medications Prior to Admission medications   Medication Sig Start Date End Date Taking? Authorizing Provider  amLODipine (NORVASC) 10 MG tablet Take 10 mg by mouth at bedtime. 01/01/21   [provider]  augmented  betamethasone dipropionate (DIPROLENE-AF) 0.05 % cream Apply 1 application topically 2 (two) times daily as needed. 01/03/21   [provider]  bacitracin ointment Apply 1 application topically 2 (two) times daily. 01/05/21   Regan Lemming, MD  blood glucose meter kit and supplies KIT Dispense based on patient and insurance preference. Use up to four times daily as directed. (FOR ICD-9 250.00, 250.01). 07/01/16   Nita Sells, MD  Calcium Acetate 667 MG TABS Take 1,334 mg by mouth 4 (four) times daily. 09/11/20   [provider]  cetirizine (ZYRTEC ALLERGY) 10 MG tablet Take 1 tablet (10 mg total) by mouth daily as needed for allergies. 08/22/20   [name], NP  cloNIDine (CATAPRES) 0.1 MG tablet SMARTSIG:1 Tablet(s) By Mouth 01/01/21   [provider]  cloNIDine (CATAPRES) 0.2 MG  tablet Take  1 tablet twice daily . 10/28/20   Donnamae Jude, MD  doxepin (SINEQUAN) 10 MG capsule Take 10 mg by mouth at bedtime. 01/03/21   [provider]  gabapentin (NEURONTIN) 100 MG capsule Take 1 capsule (100 mg total) by mouth at bedtime. 08/16/20   Florencia Reasons, MD  hydrOXYzine (ATARAX/VISTARIL) 25 MG tablet TAKE 1 TABLET BY MOUTH EVERY 8 HOURS AS NEEDED FOR ANXIETY OR  ITCHING 11/07/20   [name], NP  lidocaine-prilocaine (EMLA) cream APPLY SMALL AMOUNT TO ACCESS SITE (AVF) 1 TO 2 HOURS BEFORE DIALYSIS. COVER WITH OCCLUSIVE DRESSING (SARAN WRAP) 12/27/20   [provider]  magic mouthwash (nystatin, lidocaine, diphenhydrAMINE, alum & mag hydroxide) suspension Swish and spit 5 mLs 4 (four) times daily as needed for mouth pain. 12/12/20   Hazel Sams, PA-C  nitroGLYCERIN (NITRODUR - DOSED IN MG/24 HR) 0.2 mg/hr patch Use 1/4 patch daily to the affected area 12/12/20   Hudnall, Sharyn Lull, MD  nystatin (MYCOSTATIN) 100000 UNIT/ML suspension Take by mouth. 12/12/20   [provider]  oxyCODONE-acetaminophen (PERCOCET/ROXICET) 5-325 MG tablet Take 1 tablet by mouth every 8 (eight) hours as needed for severe pain. 11/12/20   Jaynee Eagles, PA-C  pantoprazole (PROTONIX) 40 MG tablet Take 1 tablet by mouth twice daily 10/30/20   [name], NP  triamcinolone cream (KENALOG) 0.1 % Apply 1 application topically 2 (two) times daily. 08/02/20   [provider]  colchicine 0.6 MG tablet Take 0.5 tablets (0.3 mg total) by mouth 2 (two) times daily. 07/24/20 07/29/20  Noemi Chapel, MD  ferrous sulfate 325 (65 FE) MG tablet SMARTSIG:1 Tablet(s) By Mouth Twice Daily 08/11/19 02/09/20  [provider]  furosemide (LASIX) 40 MG tablet Take 1 tablet (40 mg total) by mouth daily. 07/25/19 02/09/20  Ladona Horns, MD    Allergies    Dilaudid [hydromorphone hcl]  Review of Systems   Review of Systems  Constitutional:  Negative for chills, diaphoresis and fever.   HENT:  Negative for sore throat.   Eyes:  Negative for redness and visual disturbance.  Respiratory:  Negative for cough and shortness of breath.   Cardiovascular:  Negative for chest pain.  Gastrointestinal:  Negative for abdominal pain, blood in stool, diarrhea and vomiting.  Genitourinary:  Negative for dysuria and flank pain.  Musculoskeletal:  Negative for back pain and neck pain.  Skin:  Negative for rash.  Neurological:  Positive for weakness and light-headedness. Negative for speech difficulty, numbness and headaches.  Hematological:  Does not bruise/bleed easily.  Psychiatric/Behavioral:  Positive for confusion.    Physical Exam Updated Vital Signs BP 124/66  Pulse 82   Temp 97.6 F (36.4 C) (Oral)   Resp 15   Ht 1.803 m (_0 )   Wt 120.2 kg   SpO2 98%   BMI 36.96 kg/m   Physical Exam Vitals and nursing note reviewed.  Constitutional:      Appearance: Normal appearance. He is well-developed.  HENT:     Head: Atraumatic.     Nose: Nose normal.     Mouth/Throat:     Mouth: Mucous membranes are moist.     Pharynx: Oropharynx is clear.  Eyes:     General: No scleral icterus.    Conjunctiva/sclera: Conjunctivae normal.     Pupils: Pupils are equal, round, and reactive to light.  Neck:     Trachea: No tracheal deviation.     Comments: No stiffness or rigidity. No bruit. Cardiovascular:     Rate and Rhythm: Normal rate and regular rhythm.     Pulses: Normal pulses.     Heart sounds: Normal heart sounds. No murmur heard.   No friction rub. No gallop.  Pulmonary:     Effort: Pulmonary effort is normal. No accessory muscle usage or respiratory distress.     Breath sounds: Normal breath sounds.  Abdominal:     General: Bowel sounds are normal. There is no distension.     Palpations: Abdomen is soft.     Tenderness: There is no abdominal tenderness. There is no guarding.  Genitourinary:    Comments: No cva tenderness. Musculoskeletal:        General: No  swelling.     Cervical back: Normal range of motion and neck supple. No rigidity.     Comments: Av fistula without sign of infection and with palp thrill. No focal extremity pain, erythema/infection or tenderness.   Skin:    General: Skin is warm and dry.     Findings: No rash. Erythema: .edthis. Neurological:     Mental Status: He is alert.     Comments: Alert, speech clear, no aphasia or dysarthria. Motor/sens grossly intact bil.   Psychiatric:        Mood and Affect: Mood normal.    ED Results / Procedures / Treatments   Labs (all labs ordered are listed, but only abnormal results are displayed)  Results for orders placed or performed during the hospital encounter of 01/10/21  CBC  Result Value Ref Range   WBC 4.7 4.0 - 10.5 K/uL   RBC 2.08 (L) 4.22 - 5.81 MIL/uL   Hemoglobin 7.7 (L) 13.0 - 17.0 g/dL   HCT 24.3 (L) 39.0 - 52.0 %   MCV 116.8 (H) 80.0 - 100.0 fL   MCH 37.0 (H) 26.0 - 34.0 pg   MCHC 31.7 30.0 - 36.0 g/dL   RDW 21.4 (H) 11.5 - 15.5 %   Platelets 124 (L) 150 - 400 K/uL   nRBC 3.4 (H) 0.0 - 0.2 %  Comprehensive metabolic panel  Result Value Ref Range   Sodium 139 135 - 145 mmol/L   Potassium 3.5 3.5 - 5.1 mmol/L   Chloride 95 (L) 98 - 111 mmol/L   CO2 31 22 - 32 mmol/L   Glucose, Bld 92 70 - 99 mg/dL   BUN 20 8 - 23 mg/dL   Creatinine, Ser 5.59 (H) 0.61 - 1.24 mg/dL   Calcium 7.9 (L) 8.9 - 10.3 mg/dL   Total Protein 7.7 6.5 - 8.1 g/dL   Albumin 2.6 (L) 3.5 - 5.0 g/dL   AST 126 (H) 15 -  41 U/L   ALT 57 (H) 0 - 44 U/L   Alkaline Phosphatase 82 38 - 126 U/L   Total Bilirubin 0.7 0.3 - 1.2 mg/dL   GFR, Estimated 11 (L) >60 mL/min   Anion gap 13 5 - 15     EKG None  Radiology CT HEAD WO CONTRAST (5MM)  Result Date: 01/10/2021 CLINICAL DATA:  Altered mental status. EXAM: CT HEAD WITHOUT CONTRAST TECHNIQUE: Contiguous axial images were obtained from the base of the skull through the vertex without intravenous contrast. COMPARISON:  January 05, 2021.  FINDINGS: Brain: No evidence of acute infarction, hemorrhage, hydrocephalus, extra-axial collection or mass lesion/mass effect. Vascular: No hyperdense vessel or unexpected calcification. Skull: Normal. Negative for fracture or focal lesion. Sinuses/Orbits: No acute finding. Other: None. IMPRESSION: No acute intracranial abnormality seen. Electronically Signed   By: Marijo Conception M.D.   On: 01/10/2021 15:17    Procedures Procedures   Medications Ordered in ED Medications - No data to display  ED Course  I have reviewed the triage vital signs and the nursing notes.  Pertinent labs & imaging results that were available during my care of the patient were reviewed by me and considered in my medical decision making (see chart for details).    MDM Rules/Calculators/A&P                           Continuous pulse ox and cardiac monitoring. Labs sent. Imaging ordered.  Reviewed nursing notes and prior charts for additional history.   Labs reviewed/interpreted by me - k normal. Wbc normal.   CT reviewed/interpreted by me - no hem.  Pt indicates hasn't eaten yet today - po fluids/food.   Ambulate in hall.  Patient has eaten/drank, ambulates w steady gait.  Patients stools are heme positive, given general weakness, altered mental status (now resolved), lightheaded when stands, hgb 7 - will consult medicine for admission.   Admit call pending - signed out to field call and facilitate admission.    Final Clinical Impression(s) / ED Diagnoses Final diagnoses:  None    Rx / DC Orders ED Discharge Orders     None            Lajean Saver, MD 01/10/21 1625

## 2021-01-11 ENCOUNTER — Encounter (HOSPITAL_COMMUNITY): Payer: Self-pay | Admitting: Internal Medicine

## 2021-01-11 ENCOUNTER — Ambulatory Visit: Payer: Medicare Other | Admitting: Physician Assistant

## 2021-01-11 DIAGNOSIS — F1411 Cocaine abuse, in remission: Secondary | ICD-10-CM | POA: Diagnosis present

## 2021-01-11 DIAGNOSIS — M898X9 Other specified disorders of bone, unspecified site: Secondary | ICD-10-CM | POA: Diagnosis present

## 2021-01-11 DIAGNOSIS — R4182 Altered mental status, unspecified: Secondary | ICD-10-CM | POA: Diagnosis present

## 2021-01-11 DIAGNOSIS — N186 End stage renal disease: Secondary | ICD-10-CM | POA: Diagnosis present

## 2021-01-11 DIAGNOSIS — Z992 Dependence on renal dialysis: Secondary | ICD-10-CM | POA: Diagnosis not present

## 2021-01-11 DIAGNOSIS — I10 Essential (primary) hypertension: Secondary | ICD-10-CM

## 2021-01-11 DIAGNOSIS — R531 Weakness: Secondary | ICD-10-CM

## 2021-01-11 DIAGNOSIS — K254 Chronic or unspecified gastric ulcer with hemorrhage: Secondary | ICD-10-CM | POA: Diagnosis present

## 2021-01-11 DIAGNOSIS — F1021 Alcohol dependence, in remission: Secondary | ICD-10-CM | POA: Diagnosis present

## 2021-01-11 DIAGNOSIS — Z8711 Personal history of peptic ulcer disease: Secondary | ICD-10-CM | POA: Diagnosis not present

## 2021-01-11 DIAGNOSIS — K3189 Other diseases of stomach and duodenum: Secondary | ICD-10-CM | POA: Diagnosis not present

## 2021-01-11 DIAGNOSIS — Z20822 Contact with and (suspected) exposure to covid-19: Secondary | ICD-10-CM | POA: Diagnosis present

## 2021-01-11 DIAGNOSIS — D631 Anemia in chronic kidney disease: Secondary | ICD-10-CM | POA: Diagnosis present

## 2021-01-11 DIAGNOSIS — D649 Anemia, unspecified: Secondary | ICD-10-CM | POA: Diagnosis not present

## 2021-01-11 DIAGNOSIS — K2270 Barrett's esophagus without dysplasia: Secondary | ICD-10-CM | POA: Diagnosis present

## 2021-01-11 DIAGNOSIS — F17210 Nicotine dependence, cigarettes, uncomplicated: Secondary | ICD-10-CM | POA: Diagnosis present

## 2021-01-11 DIAGNOSIS — E1122 Type 2 diabetes mellitus with diabetic chronic kidney disease: Secondary | ICD-10-CM | POA: Diagnosis present

## 2021-01-11 DIAGNOSIS — Z6836 Body mass index (BMI) 36.0-36.9, adult: Secondary | ICD-10-CM | POA: Diagnosis not present

## 2021-01-11 DIAGNOSIS — D500 Iron deficiency anemia secondary to blood loss (chronic): Secondary | ICD-10-CM | POA: Diagnosis present

## 2021-01-11 DIAGNOSIS — B182 Chronic viral hepatitis C: Secondary | ICD-10-CM | POA: Diagnosis present

## 2021-01-11 DIAGNOSIS — R195 Other fecal abnormalities: Secondary | ICD-10-CM

## 2021-01-11 DIAGNOSIS — K2970 Gastritis, unspecified, without bleeding: Secondary | ICD-10-CM | POA: Diagnosis present

## 2021-01-11 DIAGNOSIS — E1169 Type 2 diabetes mellitus with other specified complication: Secondary | ICD-10-CM | POA: Diagnosis not present

## 2021-01-11 DIAGNOSIS — F1020 Alcohol dependence, uncomplicated: Secondary | ICD-10-CM | POA: Diagnosis not present

## 2021-01-11 DIAGNOSIS — Z79899 Other long term (current) drug therapy: Secondary | ICD-10-CM | POA: Diagnosis not present

## 2021-01-11 DIAGNOSIS — L739 Follicular disorder, unspecified: Secondary | ICD-10-CM | POA: Diagnosis present

## 2021-01-11 DIAGNOSIS — I120 Hypertensive chronic kidney disease with stage 5 chronic kidney disease or end stage renal disease: Secondary | ICD-10-CM | POA: Diagnosis present

## 2021-01-11 DIAGNOSIS — Z885 Allergy status to narcotic agent status: Secondary | ICD-10-CM | POA: Diagnosis not present

## 2021-01-11 LAB — BPAM RBC
Blood Product Expiration Date: 202210072359
ISSUE DATE / TIME: 202209151757
Unit Type and Rh: 7300

## 2021-01-11 LAB — CBC
HCT: 25.9 % — ABNORMAL LOW (ref 39.0–52.0)
Hemoglobin: 8.3 g/dL — ABNORMAL LOW (ref 13.0–17.0)
MCH: 35.9 pg — ABNORMAL HIGH (ref 26.0–34.0)
MCHC: 32 g/dL (ref 30.0–36.0)
MCV: 112.1 fL — ABNORMAL HIGH (ref 80.0–100.0)
Platelets: 111 10*3/uL — ABNORMAL LOW (ref 150–400)
RBC: 2.31 MIL/uL — ABNORMAL LOW (ref 4.22–5.81)
RDW: 24.3 % — ABNORMAL HIGH (ref 11.5–15.5)
WBC: 5.3 10*3/uL (ref 4.0–10.5)
nRBC: 1.5 % — ABNORMAL HIGH (ref 0.0–0.2)

## 2021-01-11 LAB — HEPATITIS PANEL, ACUTE
HCV Ab: REACTIVE — AB
Hep A IgM: NONREACTIVE
Hep B C IgM: NONREACTIVE
Hepatitis B Surface Ag: NONREACTIVE

## 2021-01-11 LAB — HEMOGLOBIN AND HEMATOCRIT, BLOOD
HCT: 25 % — ABNORMAL LOW (ref 39.0–52.0)
HCT: 26.6 % — ABNORMAL LOW (ref 39.0–52.0)
Hemoglobin: 7.9 g/dL — ABNORMAL LOW (ref 13.0–17.0)
Hemoglobin: 8.5 g/dL — ABNORMAL LOW (ref 13.0–17.0)

## 2021-01-11 LAB — TYPE AND SCREEN
ABO/RH(D): B POS
Antibody Screen: NEGATIVE
Unit division: 0

## 2021-01-11 LAB — CBG MONITORING, ED
Glucose-Capillary: 91 mg/dL (ref 70–99)
Glucose-Capillary: 92 mg/dL (ref 70–99)

## 2021-01-11 LAB — COMPREHENSIVE METABOLIC PANEL
ALT: 52 U/L — ABNORMAL HIGH (ref 0–44)
AST: 123 U/L — ABNORMAL HIGH (ref 15–41)
Albumin: 2.4 g/dL — ABNORMAL LOW (ref 3.5–5.0)
Alkaline Phosphatase: 75 U/L (ref 38–126)
Anion gap: 19 — ABNORMAL HIGH (ref 5–15)
BUN: 26 mg/dL — ABNORMAL HIGH (ref 8–23)
CO2: 30 mmol/L (ref 22–32)
Calcium: 8.5 mg/dL — ABNORMAL LOW (ref 8.9–10.3)
Chloride: 90 mmol/L — ABNORMAL LOW (ref 98–111)
Creatinine, Ser: 7.29 mg/dL — ABNORMAL HIGH (ref 0.61–1.24)
GFR, Estimated: 8 mL/min — ABNORMAL LOW (ref >60)
Glucose, Bld: 96 mg/dL (ref 70–99)
Potassium: 4.7 mmol/L (ref 3.5–5.1)
Sodium: 139 mmol/L (ref 135–145)
Total Bilirubin: 1.1 mg/dL (ref 0.3–1.2)
Total Protein: 7.3 g/dL (ref 6.5–8.1)

## 2021-01-11 LAB — GLUCOSE, CAPILLARY
Glucose-Capillary: 95 mg/dL (ref 70–99)
Glucose-Capillary: 140 mg/dL — ABNORMAL HIGH (ref 70–99)

## 2021-01-11 LAB — RPR: RPR Ser Ql: NONREACTIVE

## 2021-01-11 MED ORDER — CLONIDINE HCL 0.1 MG PO TABS
0.1000 mg | ORAL_TABLET | Freq: Two times a day (BID) | ORAL | Status: DC
  Administered 2021-01-11 – 2021-01-13 (×5): 0.1 mg via ORAL
  Filled 2021-01-11 (×5): qty 1

## 2021-01-11 MED ORDER — PANTOPRAZOLE SODIUM 40 MG PO TBEC
40.0000 mg | DELAYED_RELEASE_TABLET | Freq: Two times a day (BID) | ORAL | Status: DC
  Administered 2021-01-11 – 2021-01-13 (×6): 40 mg via ORAL
  Filled 2021-01-11 (×6): qty 1

## 2021-01-11 MED ORDER — CHLORHEXIDINE GLUCONATE CLOTH 2 % EX PADS
6.0000 | MEDICATED_PAD | Freq: Every day | CUTANEOUS | Status: DC
  Administered 2021-01-11: 6 via TOPICAL

## 2021-01-11 MED ORDER — CALCIUM ACETATE (PHOS BINDER) 667 MG PO CAPS
1334.0000 mg | ORAL_CAPSULE | Freq: Three times a day (TID) | ORAL | Status: DC
  Administered 2021-01-11 – 2021-01-13 (×9): 1334 mg via ORAL
  Filled 2021-01-11 (×9): qty 2

## 2021-01-11 MED ORDER — SODIUM THIOSULFATE 250 MG/ML IV SOLN
25.0000 g | INTRAVENOUS | Status: DC
  Administered 2021-01-13: 25 g via INTRAVENOUS
  Filled 2021-01-11 (×2): qty 100

## 2021-01-11 MED ORDER — GABAPENTIN 100 MG PO CAPS
100.0000 mg | ORAL_CAPSULE | Freq: Every day | ORAL | Status: DC
  Administered 2021-01-11 – 2021-01-12 (×3): 100 mg via ORAL
  Filled 2021-01-11 (×3): qty 1

## 2021-01-11 MED ORDER — CHLORHEXIDINE GLUCONATE CLOTH 2 % EX PADS
6.0000 | MEDICATED_PAD | Freq: Every day | CUTANEOUS | Status: DC
Start: 2021-01-12 — End: 2021-01-11

## 2021-01-11 MED ORDER — HYDROXYZINE HCL 25 MG PO TABS
25.0000 mg | ORAL_TABLET | Freq: Three times a day (TID) | ORAL | Status: DC | PRN
  Administered 2021-01-11 – 2021-01-12 (×2): 25 mg via ORAL
  Filled 2021-01-11 (×2): qty 1

## 2021-01-11 MED ORDER — MUPIROCIN 2 % EX OINT
1.0000 "application " | TOPICAL_OINTMENT | Freq: Two times a day (BID) | CUTANEOUS | Status: DC
  Administered 2021-01-11 – 2021-01-13 (×4): 1 via NASAL
  Filled 2021-01-11 (×2): qty 22

## 2021-01-11 NOTE — Progress Notes (Addendum)
PROGRESS NOTE    Daniel [name]   OZH:[id number]  DOB: April 14, 1961  DOA: 01/10/2021 PCP: [name], NP   Brief Narrative:  Daniel Peer Sandiferis a 60 y.o. male with medical history significant of  ESRD (Tues, Thurs, Saturday), hypertension, T2DM, multifactorial anemia (with recent bleeding duodenal ulcer 07/2020), penile calciphylaxis, alcohol abuse in remission, cocaine abuse in remission and nicotine dependence who presents to ED from dialysis for "AMS" during and after dialysis per triage notes. He was noted to have a BP of 88/56 when EMS arrive and he was given a small NS bolus. He was alert and oriented by slow to respond. The patient states he completed a full dialysis.   In ED Hb 7.7. FOBT +.    Subjective: Patient states he has had black stools ever since his last EGD/Colonoscopy. He states his last black stool was yesterday. No vomiting or hematemesis. No abdominal pain.     Assessment & Plan:   Principal Problem:   Symptomatic anemia due to GI bleeding - the patient admits to black stools -On 08/01/2020 the patient underwent a small bowel endoscopy which revealed APCs in the duodenum and jejunum. -He underwent an EGD and enteroscopy 08/10/20 which showed a 9 mm cratered duodenal ulcer with bleeding that was treated 3 hemostatic clips.  He also was noted to have gastritis. -Appreciate GI eval, will follow for plan - We will transfuse to keep hemoglobin close to 8- he has received 1 U PRBC so far - cont Protonix BID -He is hemodynamically stable without tachycardia and has a mildly elevated blood pressure -Of note ferritin was 13 in 2020, 47 on March 06/2019 followed by 313 on March 28 - will recheck iron levels tomorrow but I would presume that nephrology has been following levels as outpt  Active Problems:     ESRD on hemodialysis-dialysis Tuesday Thursday Saturday -He had a full dialysis treatment yesterday - have notified nephrology of his admission     Transaminitis  History of chronic HCV - AST 123, ALT 52-not a new finding     Essential hypertension -Holding amlodipine, clonidine in setting of acute bleeding - I will go ahead and order clonidine at 0.1 mg BID instead of 0.2 mg BID (with holding parameters) as BP is slightly elevated    DM2 (diabetes mellitus, type 2) ([place]) -Does not appear to be on any medications for this at home Hemoglobin A1C    Component Value Date/Time   HGBA1C 5.4 09/26/2020 0930   HGBA1C 6.4 (H) 03/26/2020 1022     Time spent in minutes: 35 DVT prophylaxis: SCDs Start: 01/10/21 1930  Code Status: Full code Family Communication:  Level of Care: Level of care: Progressive Disposition Plan:  Status is: Observation  The patient will require care spanning > 2 midnights and should be moved to inpatient because: IV treatments appropriate due to intensity of illness or inability to take PO and Inpatient level of care appropriate due to severity of illness  Dispo: The patient is from: Home              Anticipated d/c is to: Home              Patient currently is not medically stable to d/c.   Difficult to place patient No      Consultants:  GI Procedures:  none Antimicrobials:  Anti-infectives (From admission, onward)    None        Objective: Vitals:   01/11/21 0530 01/11/21  0545 01/11/21 0830 01/11/21 1141  BP: (!) 141/67 123/70 118/68 92/66  Pulse: 78 77 84 79  Resp: 19 14 19 17   Temp:      TempSrc:      SpO2: 98% 95% 99% 96%  Weight:      Height:        Intake/Output Summary (Last 24 hours) at 01/11/2021 1254 Last data filed at 01/10/2021 2019 Gross per 24 hour  Intake 315 ml  Output --  Net 315 ml   Filed Weights   01/10/21 1236  Weight: 120.2 kg    Examination: General exam: Appears comfortable  HEENT: PERRLA, oral mucosa moist, no sclera icterus or thrush Respiratory system: Clear to auscultation. Respiratory effort normal. Cardiovascular system: S1 & S2 heard,  RRR.   Gastrointestinal system: Abdomen soft, non-tender, nondistended. Normal bowel sounds. Central nervous system: Alert and oriented. No focal neurological deficits. Extremities: No cyanosis, clubbing or edema Skin: No rashes or ulcers Psychiatry:  Mood & affect appropriate.     Data Reviewed: I have personally reviewed following labs and imaging studies  CBC: Recent Labs  Lab 01/05/21 0505 01/06/21 1804 01/10/21 1306 01/10/21 2118 01/10/21 2333 01/11/21 0458 01/11/21 0802  WBC 6.7 6.6 4.7  --   --  5.3  --   NEUTROABS 3.0 3.5  --   --   --   --   --   HGB 7.8* 7.1* 7.7* 8.1* 7.9* 8.3* 8.5*  HCT 24.5* 22.7* 24.3* 25.4* 25.0* 25.9* 26.6*  MCV 116.7* 119.5* 116.8*  --   --  112.1*  --   PLT 149* 152 124*  --   --  111*  --    Basic Metabolic Panel: Recent Labs  Lab 01/05/21 0505 01/06/21 1804 01/10/21 1306 01/11/21 0458  NA 141 139 139 139  K 4.7 5.0 3.5 4.7  CL 96* 98 95* 90*  CO2 26 23 31 30   GLUCOSE 99 186* 92 96  BUN 56* 70* 20 26*  CREATININE 11.81* 13.36* 5.59* 7.29*  CALCIUM 9.3 8.6* 7.9* 8.5*   GFR: Estimated Creatinine Clearance: 14 mL/min (A) (by C-G formula based on SCr of 7.29 mg/dL (H)). Liver Function Tests: Recent Labs  Lab 01/05/21 0505 01/06/21 1804 01/10/21 1306 01/11/21 0458  AST 76* 102* 126* 123*  ALT 37 45* 57* 52*  ALKPHOS 70 68 82 75  BILITOT 0.8 0.8 0.7 1.1  PROT 7.3 7.2 7.7 7.3  ALBUMIN 2.4* 2.5* 2.6* 2.4*   No results for input(s): LIPASE, AMYLASE in the last 168 hours. Recent Labs  Lab 01/07/21 1006 01/10/21 2118  AMMONIA 56* 34   Coagulation Profile: No results for input(s): INR, PROTIME in the last 168 hours. Cardiac Enzymes: No results for input(s): CKTOTAL, CKMB, CKMBINDEX, TROPONINI in the last 168 hours. BNP (last 3 results) No results for input(s): PROBNP in the last 8760 hours. HbA1C: No results for input(s): HGBA1C in the last 72 hours. CBG: Recent Labs  Lab 01/10/21 2234 01/11/21 0755 01/11/21 0805   GLUCAP 103* 92 91   Lipid Profile: No results for input(s): CHOL, HDL, LDLCALC, TRIG, CHOLHDL, LDLDIRECT in the last 72 hours. Thyroid Function Tests: No results for input(s): TSH, T4TOTAL, FREET4, T3FREE, THYROIDAB in the last 72 hours. Anemia Panel: No results for input(s): VITAMINB12, FOLATE, FERRITIN, TIBC, IRON, RETICCTPCT in the last 72 hours. Urine analysis:    Component Value Date/Time   COLORURINE YELLOW 07/31/2019 2128   APPEARANCEUR CLEAR 07/31/2019 2128   LABSPEC 1.009 07/31/2019 2128  PHURINE 5.0 07/31/2019 2128   GLUCOSEU NEGATIVE 07/31/2019 2128   HGBUR SMALL (A) 07/31/2019 2128   BILIRUBINUR NEGATIVE 07/31/2019 2128   Spray 07/31/2019 2128   PROTEINUR 100 (A) 07/31/2019 2128   UROBILINOGEN 0.2 12/31/2009 0746   NITRITE NEGATIVE 07/31/2019 2128   LEUKOCYTESUR NEGATIVE 07/31/2019 2128   Sepsis Labs: @LABRCNTIP (procalcitonin:4,lacticidven:4) ) Recent Results (from the past 240 hour(s))  SARS CORONAVIRUS 2 (TAT 6-24 HRS) Nasopharyngeal Nasopharyngeal Swab     Status: None   Collection Time: 01/10/21  6:58 PM   Specimen: Nasopharyngeal Swab  Result Value Ref Range Status   SARS Coronavirus 2 NEGATIVE NEGATIVE Final    Comment: (NOTE) SARS-CoV-2 target nucleic acids are NOT DETECTED.  The SARS-CoV-2 RNA is generally detectable in upper and lower respiratory specimens during the acute phase of infection. Negative results do not preclude SARS-CoV-2 infection, do not rule out co-infections with other pathogens, and should not be used as the sole basis for treatment or other patient management decisions. Negative results must be combined with clinical observations, patient history, and epidemiological information. The expected result is Negative.  Fact Sheet for Patients: SugarRoll.be  Fact Sheet for Healthcare Providers: https://www.woods-mathews.com/  This test is not yet approved or cleared by the  [place] FDA and  has been authorized for detection and/or diagnosis of SARS-CoV-2 by FDA under an Emergency Use Authorization (EUA). This EUA will remain  in effect (meaning this test can be used) for the duration of the COVID-19 declaration under Se ction 564(b)(1) of the Act, 21 U.S.C. section 360bbb-3(b)(1), unless the authorization is terminated or revoked sooner.  Performed at [facility], [place] [address], [place], [place] [address]          Radiology Studies: CT HEAD WO CONTRAST (5MM)  Result Date: 01/10/2021 CLINICAL DATA:  Altered mental status. EXAM: CT HEAD WITHOUT CONTRAST TECHNIQUE: Contiguous axial images were obtained from the base of the skull through the vertex without intravenous contrast. COMPARISON:  January 05, 2021. FINDINGS: Brain: No evidence of acute infarction, hemorrhage, hydrocephalus, extra-axial collection or mass lesion/mass effect. Vascular: No hyperdense vessel or unexpected calcification. Skull: Normal. Negative for fracture or focal lesion. Sinuses/Orbits: No acute finding. Other: None. IMPRESSION: No acute intracranial abnormality seen. Electronically Signed   By: Marijo Conception M.D.   On: 01/10/2021 15:17      Scheduled Meds:  calcium acetate  1,334 mg Oral TID AC & HS   gabapentin  100 mg Oral QHS   insulin aspart  0-6 Units Subcutaneous TID WC   nicotine  21 mg Transdermal Daily   pantoprazole  40 mg Oral BID   sodium chloride flush  3 mL Intravenous Q12H   Continuous Infusions:  sodium chloride     sodium chloride       LOS: 0 days      Debbe Odea, MD [facility] Pager: [email or web address] 01/11/2021, 12:54 PM

## 2021-01-11 NOTE — Anesthesia Preprocedure Evaluation (Addendum)
Anesthesia Evaluation  Patient identified by MRN, date of birth, ID band Patient awake    Reviewed: Allergy & Precautions, NPO status , Patient's Chart, lab work & pertinent test results  Airway Mallampati: II  TM Distance: >3 FB Neck ROM: Full    Dental  (+) Teeth Intact   Pulmonary neg pulmonary ROS, Current Smoker,    Pulmonary exam normal        Cardiovascular hypertension, Pt. on medications  Rhythm:Regular Rate:Normal     Neuro/Psych negative neurological ROS  negative psych ROS   GI/Hepatic PUD, GERD  Medicated,(+)     substance abuse  alcohol use and cocaine use, Duodenal ulcer   Endo/Other  diabetes, Type 2  Renal/GU ESRF and DialysisRenal disease  negative genitourinary   Musculoskeletal negative musculoskeletal ROS (+)   Abdominal (+)  Abdomen: soft.    Peds  Hematology  (+) anemia ,   Anesthesia Other Findings   Reproductive/Obstetrics                            Anesthesia Physical Anesthesia Plan  ASA: 3  Anesthesia Plan: MAC   Post-op Pain Management:    Induction: Intravenous  PONV Risk Score and Plan: 0 and Ondansetron, Propofol infusion and Treatment may vary due to age or medical condition  Airway Management Planned: Simple Face Mask, Natural Airway and Nasal Cannula  Additional Equipment: None  Intra-op Plan:   Post-operative Plan:   Informed Consent: I have reviewed the patients History and Physical, chart, labs and discussed the procedure including the risks, benefits and alternatives for the proposed anesthesia with the patient or authorized representative who has indicated his/her understanding and acceptance.     Dental advisory given  Plan Discussed with: CRNA  Anesthesia Plan Comments: (Lab Results      Component                Value               Date                      WBC                      5.3                 01/11/2021                 HGB                      8.5 (L)             01/11/2021                HCT                      26.6 (L)            01/11/2021                MCV                      112.1 (H)           01/11/2021                PLT  111 (L)             01/11/2021           Lab Results      Component                Value               Date                      NA                       139                 01/11/2021                K                        4.7                 01/11/2021                CO2                      30                  01/11/2021                GLUCOSE                  96                  01/11/2021                BUN                      26 (H)              01/11/2021                CREATININE               7.29 (H)            01/11/2021                CALCIUM                  8.5 (L)             01/11/2021                GFRNONAA                 8 (L)               01/11/2021                GFRAA                    7 (L)               12/28/2019          )       Anesthesia Quick Evaluation

## 2021-01-11 NOTE — Consult Note (Signed)
Referring Provider:  [facility]         Primary Care Physician:  [name], NP Primary Gastroenterologist:   Althia Forts Reason for Consultation:    Anemia, FOBT +               ASSESSMENT / PLAN   # 60 yo male admitted with normocytic anemia,  heme positive black stool.  Iron on home medication list but patient says he does not take it. Difficult to discern baseline hemoglobin.  History of bleeding duodenal ulcer and late April of this year.  He has continued to take NSAIDs.  He may be bleeding from known duodenal ulcer.  He could also be bleeding from AVMs.  --Review of blood transfusion history shows he got a unit of blood on 12/28/20. Hgb on 9/10 was 7.8 and now down to 7.1  --Hgb improved to 7.7 after a unit of blood in the ED.  --Will need repeat EGD, most likely will be done tomorrow. The risks and benefits of EGD with possible biopsies were discussed with the patient who agrees to proceed.   # Chronic HCV  # Elevated liver enzymes. Pattern of AST / ALT elevation suggests ETOh related.   # ESRD on HD   HISTORY OF PRESENT ILLNESS                                                                                                                         Chief Complaint:  dizziness and black stool  Daniel [name] is a 60 y.o. male with a past medical history significant for ESRD on HD Tu /Th / Sa, HTN,  DM2, chronic anemia, hx of PUD with recent bleeding duodenal ulcer, hepatitis C, diverticulosis, history of cocaine abuse, history of Etoh abuse.  See PMH for any additional medical history.    ED course:  Patient came to ED yesterday for AMS and feeling dizzy at dialysis. In ED, he was hemodynamically stable.  Hgb is 8.3 ( at baseline), FOBT + Patient has chronic anemia, previously evaluated endoscopically. 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 In late April 2022 he had  an upper endoscopy for GI bleeding.  He had a duodenal ulcer with a visible vessel which we clipped.  We wanted an H. pylori stool antigen but I do not see where that was ever obtained.  Patient says that since he had the duodenal ulcer he has continued to have generalized abdominal pain which is constant and not relieved with defecation.  He takes ibuprofen for the pain on a daily basis.  He is not on any acid blockers. He has been having black stool for a week. He says that he doesn't take oral iron though it it listed on home med list. No nausea / vomiting.    PREVIOUS ENDOSCOPIC EVALUATIONS    March 2021 Colonoscopy and  EGD for anemia and FOBT Colonoscopy --complete exam, good prep --No active bleeding or source of heme positive stool/anemia seen. - Diverticulosis in the sigmoid colon. - Probable old blood in the terminal ileum, raising the question of a small bowel source (given negative egd). - The distal rectum and anal verge are normal on retroflexion view. - No specimens collected. EGD - Normal larynx. - Normal esophagus. - Normal stomach. - Duodenal polyp vs. prominent papilla, benign-appearing. Biopsied. FINAL MICROSCOPIC DIAGNOSIS:   A. DUODENUM, BIOPSY:  - Patchy chronic duodenitis with surface gastric foveolar metaplasia,  suggestive of peptic duodenitis   B. DUODENUM, BIOPSY:  - Patchy chronic duodenitis with surface gastric foveolar metaplasia,  suggestive of peptic duodenitis   March 2021 Small bowel video capsule study --Normal study . No blood seen in 13 hours   08/02/19 Small bowel enteroscopy for intermittent dark stools.  --Esophagogastric landmarks identified. - Normal esophagus. - Normal stomach. - Prominent ampulla without overt adenomatous changes. Biopsied. - A few benign appearing duodenal polyps. Biopsied, one suspected AVM treated as above. 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 Treated with argon plasma coagulation (APC). - A  single non-bleeding angiodysplastic lesion in the proximal jejunum. Treated with argon plasma coagulation (APC).  FINAL MICROSCOPIC DIAGNOSIS:   A. AMPULLA, BIOPSY:  - Pyloric gland adenoma  - Negative for cytologic dysplasia in submitted biopsies   B. DUODENUM BULB, BIOPSY:  - Duodenal mucosa with mild reactive changes  - Negative for dysplasia, increased intraepithelial lymphocytes or  villous architectural changes    08/10/20 EGD for melena  --normal esophagus --Gastriits --Bleeding duodenal ulcer with a visible vessel. Clips were placed x 3 with apparent hemostasis. - Duodenitis with nodularity and polypoid mucosa. - No specimens collected   Past Medical History:  Diagnosis Date   Anemia    Diabetes mellitus without complication [facility])    patient denies   Dialysis patient [facility])    End stage chronic kidney disease ([employer])    Hypertension    ICH (intracerebral hemorrhage) ([place]) 05/20/2017   Shoulder pain, left 06/28/2013    Past Surgical History:  Procedure Laterality Date   A/V FISTULAGRAM N/A 08/15/2020   Procedure: A/V FISTULAGRAM - Left Upper;  Surgeon: Cherre Robins, MD;  Location: [place] [facility];  Service: Cardiovascular;  Laterality: N/A;   APPENDECTOMY     AV FISTULA PLACEMENT Left 08/03/2019   Procedure: LEFT ARM ARTERIOVENOUS (AV) CEPHALIC  FISTULA CREATION;  Surgeon: Waynetta Sandy, MD;  Location: [place];  Service: Vascular;  Laterality: Left;   BIOPSY  06/30/2019   Procedure: BIOPSY;  Surgeon: Ronald Lobo, MD;  Location: [place];  Service: Endoscopy;;   BIOPSY  08/02/2019   Procedure: BIOPSY;  Surgeon: Yetta Flock, MD;  Location: [place];  Service: Gastroenterology;;   COLONOSCOPY  01/23/2012   Procedure: COLONOSCOPY;  Surgeon: Danie Binder, MD;  [facility];  Service: Endoscopy;  Laterality: N/A;  11:10 AM   COLONOSCOPY WITH PROPOFOL N/A 06/30/2019   Procedure: COLONOSCOPY WITH PROPOFOL;  Surgeon: Ronald Lobo, MD;   Location: [place];  Service: Endoscopy;  Laterality: N/A;   ENTEROSCOPY N/A 08/02/2019   Procedure: ENTEROSCOPY;  Surgeon: Yetta Flock, MD;  Location: [facility] END[facility]COPY;  Service: Gastroenterology;  Laterality: N/A;   ESOPHAGOGASTRODUODENOSCOPY N/A 08/10/2020   Procedure: ESOPHAGOGASTRODUODENOSCOPY (EGD);  Surgeon: Jerene Bears, MD;  Location: [facility] ENDOSCOPY;  Service: Gastroenterology;  Laterality: N/A;   ESOPHAGOGASTRODUODENOSCOPY (EGD) WITH PROPOFOL N/A 06/30/2019  Procedure: ESOPHAGOGASTRODUODENOSCOPY (EGD) WITH PROPOFOL;  Surgeon: Ronald Lobo, MD;  Location: [place];  Service: Endoscopy;  Laterality: N/A;   FISTULA SUPERFICIALIZATION Left 10/17/2019   Procedure: LEFT UPPER EXTREMITY FISTULA REVISION, SIDE BRANCH LIGATION,  AND SUPERFICIALIZATION;  Surgeon: Marty Heck, MD;  Location: [place];  Service: Vascular;  Laterality: Left;   GIVENS CAPSULE STUDY N/A 06/30/2019   Procedure: GIVENS CAPSULE STUDY;  Surgeon: Ronald Lobo, MD;  Location: [place];  Service: Endoscopy;  Laterality: N/A;   HEMOSTASIS CLIP PLACEMENT  08/10/2020   Procedure: HEMOSTASIS CLIP PLACEMENT;  Surgeon: Jerene Bears, MD;  Location: [place] ENDOSCOPY;  Service: Gastroenterology;;   HEMOSTASIS CONTROL  08/02/2019   Procedure: HEMOSTASIS CONTROL;  Surgeon: Yetta Flock, MD;  Location: [place] ENDOSCOPY;  Service: Gastroenterology;;   INCISION AND DRAINAGE ABSCESS N/A 06/29/2016   Procedure: INCISION AND DRAINAGE ABDOMINAL WALL ABSCESS;  Surgeon: Alphonsa Overall, MD;  Location: [facility];  Service: General;  Laterality: N/A;   INSERTION OF DIALYSIS CATHETER Right 08/03/2019   Procedure: INSERTION OF DIALYSIS CATHETER;  Surgeon: Waynetta Sandy, MD;  Location: [place];  Service: Vascular;  Laterality: Right;   INSERTION OF DIALYSIS CATHETER Right 10/22/2019   Procedure: INSERTION OF 23CM TUNNELED DIALYSIS CATHETER RIGHT INTERNAL JUGULAR;  Surgeon: Angelia Mould, MD;  Location: [place];  Service:  Vascular;  Laterality: Right;   INSERTION OF DIALYSIS CATHETER Right 08/12/2020   Procedure: INSERTION OF Right internal Jugular TUNNELED  DIALYSIS CATHETER.;  Surgeon: Waynetta Sandy, MD;  Location: [place];  Service: Vascular;  Laterality: Right;   Left heel surgery     PENILE BIOPSY N/A 03/26/2020   Procedure: PENILE ULCER DEBRIDEMENT;  Surgeon: Remi Haggard, MD;  Location: [facility];  Service: Urology;  Laterality: N/A;  56 MINS    Prior to Admission medications   Medication Sig Start Date End Date Taking? Authorizing Provider  amLODipine (NORVASC) 10 MG tablet Take 10 mg by mouth at bedtime. 01/01/21   [provider]  augmented betamethasone dipropionate (DIPROLENE-AF) 0.05 % cream Apply 1 application topically 2 (two) times daily as needed. 01/03/21   [provider]  bacitracin ointment Apply 1 application topically 2 (two) times daily. 01/05/21   Regan Lemming, MD  blood glucose meter kit and supplies KIT Dispense based on patient and insurance preference. Use up to four times daily as directed. (FOR ICD-9 250.00, 250.01). 07/01/16   Nita Sells, MD  Calcium Acetate 667 MG TABS Take 1,334 mg by mouth 4 (four) times daily. 09/11/20   [provider]  cetirizine (ZYRTEC ALLERGY) 10 MG tablet Take 1 tablet (10 mg total) by mouth daily as needed for allergies. 08/22/20   [name], NP  cloNIDine (CATAPRES) 0.1 MG tablet SMARTSIG:1 Tablet(s) By Mouth 01/01/21   [provider]  cloNIDine (CATAPRES) 0.2 MG tablet Take  1 tablet twice daily . 10/28/20   Donnamae Jude, MD  doxepin (SINEQUAN) 10 MG capsule Take 10 mg by mouth at bedtime. 01/03/21   [provider]  gabapentin (NEURONTIN) 100 MG capsule Take 1 capsule (100 mg total) by mouth at bedtime. 08/16/20   Florencia Reasons, MD  hydrOXYzine (ATARAX/VISTARIL) 25 MG tablet TAKE 1 TABLET BY MOUTH EVERY 8 HOURS AS NEEDED FOR ANXIETY OR  ITCHING 11/07/20   [name], NP   lidocaine-prilocaine (EMLA) cream APPLY SMALL AMOUNT TO ACCESS SITE (AVF) 1 TO 2 HOURS BEFORE DIALYSIS. COVER WITH OCCLUSIVE DRESSING (SARAN WRAP) 12/27/20   [provider]  magic mouthwash (nystatin, lidocaine, diphenhydrAMINE, alum & mag hydroxide) suspension Swish and spit 5 mLs 4 (four) times daily as needed for mouth pain. 12/12/20   Hazel Sams, PA-C  nitroGLYCERIN (NITRODUR - DOSED IN MG/24 HR) 0.2 mg/hr patch Use 1/4 patch daily to the affected area 12/12/20   Hudnall, Sharyn Lull, MD  nystatin (MYCOSTATIN) 100000 UNIT/ML suspension Take by mouth. 12/12/20   [provider]  oxyCODONE-acetaminophen (PERCOCET/ROXICET) 5-325 MG tablet Take 1 tablet by mouth every 8 (eight) hours as needed for severe pain. 11/12/20   Jaynee Eagles, PA-C  pantoprazole (PROTONIX) 40 MG tablet Take 1 tablet by mouth twice daily 10/30/20   [name], NP  triamcinolone cream (KENALOG) 0.1 % Apply 1 application topically 2 (two) times daily. 08/02/20   [provider]  colchicine 0.6 MG tablet Take 0.5 tablets (0.3 mg total) by mouth 2 (two) times daily. 07/24/20 07/29/20  Noemi Chapel, MD  ferrous sulfate 325 (65 FE) MG tablet SMARTSIG:1 Tablet(s) By Mouth Twice Daily 08/11/19 02/09/20  [provider]  furosemide (LASIX) 40 MG tablet Take 1 tablet (40 mg total) by mouth daily. 07/25/19 02/09/20  Ladona Horns, MD    Current Facility-Administered Medications  Medication Dose Route Frequency Provider Last Rate Last Admin   0.9 %  sodium chloride infusion  10 mL/hr Intravenous Once Lajean Saver, MD       0.9 %  sodium chloride infusion  250 mL Intravenous PRN Orma Flaming, MD       calcium acetate (PHOSLO) capsule 1,334 mg  1,334 mg Oral TID AC & HS Orma Flaming, MD   1,334 mg at 01/11/21 0815   gabapentin (NEURONTIN) capsule 100 mg  100 mg Oral QHS Orma Flaming, MD   100 mg at 01/11/21 0050   hydrOXYzine (ATARAX/VISTARIL) tablet 25 mg  25 mg Oral TID PRN Orma Flaming, MD        insulin aspart (novoLOG) injection 0-6 Units  0-6 Units Subcutaneous TID WC Orma Flaming, MD       nicotine (NICODERM CQ - dosed in mg/24 hours) patch 21 mg  21 mg Transdermal Daily Orma Flaming, MD   21 mg at 01/10/21 2237   pantoprazole (PROTONIX) EC tablet 40 mg  40 mg Oral BID Orma Flaming, MD   40 mg at 01/11/21 0050   sodium chloride flush (NS) 0.9 % injection 3 mL  3 mL Intravenous Q12H Orma Flaming, MD   3 mL at 01/10/21 2235   sodium chloride flush (NS) 0.9 % injection 3 mL  3 mL Intravenous PRN Orma Flaming, MD       Current Outpatient Medications  Medication Sig Dispense Refill   amLODipine (NORVASC) 10 MG tablet Take 10 mg by mouth at bedtime.     augmented betamethasone dipropionate (DIPROLENE-AF) 0.05 % cream Apply 1 application topically 2 (two) times daily as needed.     bacitracin ointment Apply 1 application topically 2 (two) times daily. 120 g 0   blood glucose meter kit and supplies KIT Dispense based on patient and insurance preference. Use up to four times daily as directed. (FOR ICD-9 250.00, 250.01). 1 each 0   Calcium Acetate 667 MG TABS Take 1,334 mg by mouth 4 (four) times daily.     cetirizine (ZYRTEC ALLERGY) 10 MG tablet Take 1 tablet (10 mg total) by mouth daily as needed for allergies. 90 tablet 1   cloNIDine (CATAPRES) 0.1 MG tablet SMARTSIG:1 Tablet(s) By Mouth  cloNIDine (CATAPRES) 0.2 MG tablet Take  1 tablet twice daily . 180 tablet 1   doxepin (SINEQUAN) 10 MG capsule Take 10 mg by mouth at bedtime.     gabapentin (NEURONTIN) 100 MG capsule Take 1 capsule (100 mg total) by mouth at bedtime. 30 capsule 0   hydrOXYzine (ATARAX/VISTARIL) 25 MG tablet TAKE 1 TABLET BY MOUTH EVERY 8 HOURS AS NEEDED FOR ANXIETY OR  ITCHING 90 tablet 0   lidocaine-prilocaine (EMLA) cream APPLY SMALL AMOUNT TO ACCESS SITE (AVF) 1 TO 2 HOURS BEFORE DIALYSIS. COVER WITH OCCLUSIVE DRESSING (SARAN WRAP)     magic mouthwash (nystatin, lidocaine, diphenhydrAMINE, alum & mag  hydroxide) suspension Swish and spit 5 mLs 4 (four) times daily as needed for mouth pain. 180 mL 0   nitroGLYCERIN (NITRODUR - DOSED IN MG/24 HR) 0.2 mg/hr patch Use 1/4 patch daily to the affected area 30 patch 1   nystatin (MYCOSTATIN) 100000 UNIT/ML suspension Take by mouth.     oxyCODONE-acetaminophen (PERCOCET/ROXICET) 5-325 MG tablet Take 1 tablet by mouth every 8 (eight) hours as needed for severe pain. 10 tablet 0   pantoprazole (PROTONIX) 40 MG tablet Take 1 tablet by mouth twice daily 60 tablet 0   triamcinolone cream (KENALOG) 0.1 % Apply 1 application topically 2 (two) times daily.      Allergies as of 01/10/2021 - Review Complete 01/10/2021  Allergen Reaction Noted   Dilaudid [hydromorphone hcl] Itching and Other (See Comments) 07/29/2020    Family History  Problem Relation Age of Onset   Colon cancer Neg Hx     Social History   Socioeconomic History   Marital status: Single    Spouse name: Not on file   Number of children: Not on file   Years of education: Not on file   Highest education level: Not on file  Occupational History   Not on file  Tobacco Use   Smoking status: Every Day    Packs/day: 1.00    Years: 30.00    Pack years: 30.00    Types: Cigarettes   Smokeless tobacco: Never  Vaping Use   Vaping Use: Never used  Substance and Sexual Activity   Alcohol use: Not Currently    Alcohol/week: 12.0 standard drinks    Types: 12 Cans of beer per week   Drug use: Not Currently    Types: "Crack" cocaine    Comment: last in 2020   Sexual activity: Yes    Birth control/protection: None  Other Topics Concern   Not on file  Social History Narrative   Not on file   Social Determinants of Health   Financial Resource Strain: Not on file  Food Insecurity: Not on file  Transportation Needs: Not on file  Physical Activity: Not on file  Stress: Not on file  Social Connections: Not on file  Intimate Partner Violence: Not on file    Review of  Systems: Positive for shortness of breath and dizziness. All other systems reviewed and negative except where noted in HPI.  OBJECTIVE    Physical Exam: Vital signs in last 24 hours: Temp:  [97.6 F (36.4 C)-98.3 F (36.8 C)] 98 F (36.7 C) (09/16 0320) Pulse Rate:  [76-84] 84 (09/16 0830) Resp:  [12-34] 19 (09/16 0830) BP: (109-157)/(52-91) 118/68 (09/16 0830) SpO2:  [95 %-100 %] 99 % (09/16 0830) Weight:  [120.2 kg] 120.2 kg (09/15 1236)   General:   Alert  male in NAD Psych:  Cooperative. Normal mood and affect. Eyes:  Pupils equal, sclera clear, no icterus.   Conjunctiva pink. Ears:  Normal auditory acuity. Nose:  No deformity, discharge,  or lesions. Neck:  Supple; no masses Lungs:  Clear throughout to auscultation.    Heart:  Regular rate and rhythm;  no lower extremity edema Abdomen:  Soft, obese, nontender, BS active, no palp mass   Rectal:  Deferred  Msk:  Symmetrical without gross deformities. . Neurologic:  Alert and  oriented x4;  grossly normal neurologically. Skin:  Intact without significant lesions or rashes.   Scheduled inpatient medications  calcium acetate  1,334 mg Oral TID AC & HS   gabapentin  100 mg Oral QHS   insulin aspart  0-6 Units Subcutaneous TID WC   nicotine  21 mg Transdermal Daily   pantoprazole  40 mg Oral BID   sodium chloride flush  3 mL Intravenous Q12H      Intake/Output from previous day: 09/15 0701 - 09/16 0700 In: 315 [Blood:315] Out: -  Intake/Output this shift: No intake/output data recorded.   Lab Results: Recent Labs    01/10/21 1306 01/10/21 2118 01/10/21 2333 01/11/21 0458  WBC 4.7  --   --  5.3  HGB 7.7* 8.1* 7.9* 8.3*  HCT 24.3* 25.4* 25.0* 25.9*  PLT 124*  --   --  111*   BMET Recent Labs    01/10/21 1306 01/11/21 0458  NA 139 139  K 3.5 4.7  CL 95* 90*  CO2 31 30  GLUCOSE 92 96  BUN 20 26*  CREATININE 5.59* 7.29*  CALCIUM 7.9* 8.5*   LFT Recent Labs    01/11/21 0458  PROT 7.3  ALBUMIN  2.4*  AST 123*  ALT 52*  ALKPHOS 75  BILITOT 1.1   PT/INR No results for input(s): LABPROT, INR in the last 72 hours. Hepatitis Panel Recent Labs    01/10/21 2051  HEPBSAG NON REACTIVE  HCVAB Reactive*  HEPAIGM NON REACTIVE  HEPBIGM NON REACTIVE     . CBC Latest Ref Rng & Units 01/11/2021 01/10/2021 01/10/2021  WBC 4.0 - 10.5 K/uL 5.3 - -  Hemoglobin 13.0 - 17.0 g/dL 8.3(L) 7.9(L) 8.1(L)  Hematocrit 39.0 - 52.0 % 25.9(L) 25.0(L) 25.4(L)  Platelets 150 - 400 K/uL 111(L) - -    . CMP Latest Ref Rng & Units 01/11/2021 01/10/2021 01/06/2021  Glucose 70 - 99 mg/dL 96 92 186(H)  BUN 8 - 23 mg/dL 26(H) 20 70(H)  Creatinine 0.61 - 1.24 mg/dL 7.29(H) 5.59(H) 13.36(H)  Sodium 135 - 145 mmol/L 139 139 139  Potassium 3.5 - 5.1 mmol/L 4.7 3.5 5.0  Chloride 98 - 111 mmol/L 90(L) 95(L) 98  CO2 22 - 32 mmol/L 30 31 23   Calcium 8.9 - 10.3 mg/dL 8.5(L) 7.9(L) 8.6(L)  Total Protein 6.5 - 8.1 g/dL 7.3 7.7 7.2  Total Bilirubin 0.3 - 1.2 mg/dL 1.1 0.7 0.8  Alkaline Phos 38 - 126 U/L 75 82 68  AST 15 - 41 U/L 123(H) 126(H) 102(H)  ALT 0 - 44 U/L 52(H) 57(H) 45(H)   Studies/Results: CT HEAD WO CONTRAST (5MM)  Result Date: 01/10/2021 CLINICAL DATA:  Altered mental status. EXAM: CT HEAD WITHOUT CONTRAST TECHNIQUE: Contiguous axial images were obtained from the base of the skull through the vertex without intravenous contrast. COMPARISON:  January 05, 2021. FINDINGS: Brain: No evidence of acute infarction, hemorrhage, hydrocephalus, extra-axial collection or mass lesion/mass effect. Vascular: No hyperdense vessel or unexpected calcification. Skull: Normal. Negative for fracture or focal lesion. Sinuses/Orbits: No acute finding.  Other: None. IMPRESSION: No acute intracranial abnormality seen. Electronically Signed   By: Marijo Conception M.D.   On: 01/10/2021 15:17    Principal Problem:   Symptomatic anemia Active Problems:   Essential hypertension   DM2 (diabetes mellitus, type 2) (HCC)   Iron  deficiency anemia   GI bleed   ESRD on hemodialysis ([place])   Transaminitis    Tye Savoy, NP-C @  01/11/2021, 9:16 AM

## 2021-01-11 NOTE — ED Notes (Signed)
Called to follow up on bed assignment and report. Charge RN to change room assignment and assign to another Therapist, sports.

## 2021-01-11 NOTE — Consult Note (Signed)
[facility] Renal Consultation Note  Requesting MD: Debbe Odea, MD Indication for Consultation: ESRD  Chief complaint: confusion, dark stools  HPI:  Daniel [name] is a 60 y.o. male with a history of ESRD on HD TTS, HTN, T2DM, hx penile calciphylaxis, cocaine abuse who presented to the hospital with black stools which he states has been going on about a month.  Did have HD Thursday.  Came in with reported AMS and hypotension after HD as well.  He was recently referred to GI for melanotic stools.  Did receive a unit of PRBC's on 9/2.  Prior hx bleeding duodenal ulcer in April.  Reviewed outpatient HD orders as below.  Feels ok.   PMHx:   Past Medical History:  Diagnosis Date   Anemia    Diabetes mellitus without complication [facility])    patient denies   Dialysis patient [facility])    End stage chronic kidney disease ([place])    Hypertension    ICH (intracerebral hemorrhage) ([place]) 05/20/2017   Shoulder pain, left 06/28/2013    Past Surgical History:  Procedure Laterality Date   A/V FISTULAGRAM N/A 08/15/2020   Procedure: A/V FISTULAGRAM - Left Upper;  Surgeon: Cherre Robins, MD;  [facility];  Service: Cardiovascular;  Laterality: N/A;   APPENDECTOMY     AV FISTULA PLACEMENT Left 08/03/2019   Procedure: LEFT ARM ARTERIOVENOUS (AV) CEPHALIC  FISTULA CREATION;  Surgeon: Waynetta Sandy, MD;  Location: [place];  Service: Vascular;  Laterality: Left;   BIOPSY  06/30/2019   Procedure: BIOPSY;  Surgeon: Ronald Lobo, MD;  Location: [place];  Service: Endoscopy;;   BIOPSY  08/02/2019   Procedure: BIOPSY;  Surgeon: Yetta Flock, MD;  Location: [place];  Service: Gastroenterology;;   COLONOSCOPY  01/23/2012   Procedure: COLONOSCOPY;  Surgeon: Danie Binder, MD;  [facility];  Service: Endoscopy;  Laterality: N/A;  11:10 AM   COLONOSCOPY WITH PROPOFOL N/A 06/30/2019   Procedure: COLONOSCOPY WITH PROPOFOL;  Surgeon: Ronald Lobo,  MD;  Location: [place];  Service: Endoscopy;  Laterality: N/A;   ENTEROSCOPY N/A 08/02/2019   Procedure: ENTEROSCOPY;  Surgeon: Yetta Flock, MD;  Location[facility] ENDOSCOPY;  Service: Gastroenterology;  Laterality: N/A;   ESOPHAGOGASTRODUODENOSCOPY N/A 08/10/2020   Procedure: ESOPHAGOGASTRODUODENOSCOPY (EGD);  Surgeon: Jerene Bears, MD;  Location: [facility] ENDOSCOPY;  Service: Gastroenterology;  Laterality: N/A;   ESOPHAGOGASTRODUODENOSCOPY (EGD) WITH PROPOFOL N/A 06/30/2019   Procedure: ESOPHAGOGASTRODUODENOSCOPY (EGD) WITH PROPOFOL;  Surgeon: Ronald Lobo, MD;  Location: [place];  Service: Endoscopy;  Laterality: N/A;   FISTULA SUPERFICIALIZATION Left 10/17/2019   Procedure: LEFT UPPER EXTREMITY FISTULA REVISION, SIDE BRANCH LIGATION,  AND SUPERFICIALIZATION;  Surgeon: Marty Heck, MD;  Location: [place];  Service: Vascular;  Laterality: Left;   GIVENS CAPSULE STUDY N/A 06/30/2019   Procedure: GIVENS CAPSULE STUDY;  Surgeon: Ronald Lobo, MD;  Location: [place];  Service: Endoscopy;  Laterality: N/A;   HEMOSTASIS CLIP PLACEMENT  08/10/2020   Procedure: HEMOSTASIS CLIP PLACEMENT;  Surgeon: Jerene Bears, MD;  Location: [place] ENDOSCOPY;  Service: Gastroenterology;;   HEMOSTASIS CONTROL  08/02/2019   Procedure: HEMOSTASIS CONTROL;  Surgeon: Yetta Flock, MD;  Location: [facility] ENDOSCOPY;  Service: Gastroenterology;;   INCISION AND DRAINAGE ABSCESS N/A 06/29/2016   Procedure: INCISION AND DRAINAGE ABDOMINAL WALL ABSCESS;  Surgeon: Alphonsa Overall, MD;  Location: [facility];  Service: General;  Laterality: N/A;   INSERTION OF DIALYSIS CATHETER Right 08/03/2019   Procedure: INSERTION  OF DIALYSIS CATHETER;  Surgeon: Waynetta Sandy, MD;  Location: [place];  Service: Vascular;  Laterality: Right;   INSERTION OF DIALYSIS CATHETER Right 10/22/2019   Procedure: INSERTION OF 23CM TUNNELED DIALYSIS CATHETER RIGHT INTERNAL JUGULAR;  Surgeon: [place]ia Mould, MD;  Location: [place];   Service: Vascular;  Laterality: Right;   INSERTION OF DIALYSIS CATHETER Right 08/12/2020   Procedure: INSERTION OF Right internal Jugular TUNNELED  DIALYSIS CATHETER.;  Surgeon: Waynetta Sandy, MD;  Location: [place];  Service: Vascular;  Laterality: Right;   Left heel surgery     PENILE BIOPSY N/A 03/26/2020   Procedure: PENILE ULCER DEBRIDEMENT;  Surgeon: Remi Haggard, MD;  Location: [facility];  Service: Urology;  Laterality: N/A;  53 MINS    Family Hx:  Family History  Problem Relation Age of Onset   Colon cancer Neg Hx     Social History:  reports that he has been smoking cigarettes. He has a 30.00 pack-year smoking history. He has never used smokeless tobacco. He reports that he does not currently use alcohol after a past usage of about 12.0 standard drinks per week. He reports that he does not currently use drugs after having used the following drugs: "Crack" cocaine.  Allergies:  Allergies  Allergen Reactions   Dilaudid [Hydromorphone Hcl] Itching and Other (See Comments)    Pt reports itchiness after IM injection     Medications: Prior to Admission medications   Medication Sig Start Date End Date Taking? Authorizing Provider  amLODipine (NORVASC) 10 MG tablet Take 10 mg by mouth at bedtime. 01/01/21   [provider]  augmented betamethasone dipropionate (DIPROLENE-AF) 0.05 % cream Apply 1 application topically 2 (two) times daily as needed. 01/03/21   [provider]  bacitracin ointment Apply 1 application topically 2 (two) times daily. 01/05/21   Regan Lemming, MD  blood glucose meter kit and supplies KIT Dispense based on patient and insurance preference. Use up to four times daily as directed. (FOR ICD-9 250.00, 250.01). 07/01/16   Nita Sells, MD  Calcium Acetate 667 MG TABS Take 1,334 mg by mouth 4 (four) times daily. 09/11/20   [provider]  cetirizine (ZYRTEC ALLERGY) 10 MG tablet Take 1 tablet (10 mg total) by mouth daily as  needed for allergies. 08/22/20   [name], NP  cloNIDine (CATAPRES) 0.1 MG tablet SMARTSIG:1 Tablet(s) By Mouth 01/01/21   [provider]  cloNIDine (CATAPRES) 0.2 MG tablet Take  1 tablet twice daily . 10/28/20   Donnamae Jude, MD  doxepin (SINEQUAN) 10 MG capsule Take 10 mg by mouth at bedtime. 01/03/21   [provider]  gabapentin (NEURONTIN) 100 MG capsule Take 1 capsule (100 mg total) by mouth at bedtime. 08/16/20   Florencia Reasons, MD  hydrOXYzine (ATARAX/VISTARIL) 25 MG tablet TAKE 1 TABLET BY MOUTH EVERY 8 HOURS AS NEEDED FOR ANXIETY OR  ITCHING 11/07/20   [name], NP  lidocaine-prilocaine (EMLA) cream APPLY SMALL AMOUNT TO ACCESS SITE (AVF) 1 TO 2 HOURS BEFORE DIALYSIS. COVER WITH OCCLUSIVE DRESSING (SARAN WRAP) 12/27/20   [provider]  magic mouthwash (nystatin, lidocaine, diphenhydrAMINE, alum & mag hydroxide) suspension Swish and spit 5 mLs 4 (four) times daily as needed for mouth pain. 12/12/20   Hazel Sams, PA-C  nitroGLYCERIN (NITRODUR - DOSED IN MG/24 HR) 0.2 mg/hr patch Use 1/4 patch daily to the affected area 12/12/20   Hudnall, Sharyn Lull, MD  nystatin (MYCOSTATIN) 100000 UNIT/ML suspension  Take by mouth. 12/12/20   [provider]  oxyCODONE-acetaminophen (PERCOCET/ROXICET) 5-325 MG tablet Take 1 tablet by mouth every 8 (eight) hours as needed for severe pain. 11/12/20   Jaynee Eagles, PA-C  pantoprazole (PROTONIX) 40 MG tablet Take 1 tablet by mouth twice daily 10/30/20   [name], NP  triamcinolone cream (KENALOG) 0.1 % Apply 1 application topically 2 (two) times daily. 08/02/20   [provider]  colchicine 0.6 MG tablet Take 0.5 tablets (0.3 mg total) by mouth 2 (two) times daily. 07/24/20 07/29/20  Noemi Chapel, MD  ferrous sulfate 325 (65 FE) MG tablet SMARTSIG:1 Tablet(s) By Mouth Twice Daily 08/11/19 02/09/20  [provider]  furosemide (LASIX) 40 MG tablet Take 1 tablet (40 mg total) by mouth daily. 07/25/19  02/09/20  Ladona Horns, MD    I have reviewed the patient's current and reported prior to admission medications.  Labs:  BMP Latest Ref Rng & Units 01/11/2021 01/10/2021 01/06/2021  Glucose 70 - 99 mg/dL 96 92 186(H)  BUN 8 - 23 mg/dL 26(H) 20 70(H)  Creatinine 0.61 - 1.24 mg/dL 7.29(H) 5.59(H) 13.36(H)  BUN/Creat Ratio 10 - 24 - - -  Sodium 135 - 145 mmol/L 139 139 139  Potassium 3.5 - 5.1 mmol/L 4.7 3.5 5.0  Chloride 98 - 111 mmol/L 90(L) 95(L) 98  CO2 22 - 32 mmol/L _0 Calcium 8.9 - 10.3 mg/dL 8.5(L) 7.9(L) 8.6(L)   ROS:  Pertinent items noted in HPI and remainder of comprehensive ROS otherwise negative.  Physical Exam: Vitals:   01/11/21 1530 01/11/21 1654  BP: 128/78 (!) 193/93  Pulse: 78   Resp: 20   Temp: 98.3 F (36.8 C) 98.4 F (36.9 C)  SpO2: 94%      General: adult male in bed in NAD HEENT: NCAT Eyes: EOMI scler anicteric Neck: supple trachea midline Heart: S1S2 no rub Lungs: clear to auscultation; normal work of breathing at rest on room air  Abdomen: soft/nt/obese habitus/nd Extremities: trace edema no cyanosis or clubbing Skin: lesions on arms  Neuro: alert and oriented x3 provides hx and follows commands Access: LUE AVF bruit and thrill    Outpatient HD orders:  [facility] [place] TTS 4 hours  124 kg  2K/2 Ca BF 450 DF 500 AVF Hectorol 3 mcg every tx Venofer 50 mg weekly Korsuva Mircera 225 mcg every 2 weeks - last given on 01/01/21 Sodium thiosulfate No systemic heparin (hep lock ok) Nipro cellentia dialyzer 01/10/21 post weight was 132 kg (though was 124.3 on 9/3)  Assessment/Plan:  # ESRD  - HD per TTS schedule; tx after any procedure with GI tomorrow  # GI bleed  - GI consulted  - on PPI  # Anemia chronic renal failure and GI losses - PRBC's for goal of 7-8  # HTN  - optimize volume with HD   # Metabolic bone disease  - outpatient orders include hectorol as above; continue home binders when taking PO  #  Calciphylaxis - Continue sodium thiosulfate    Claudia Desanctis 01/11/2021, 5:17 PM

## 2021-01-11 NOTE — H&P (View-Only) (Signed)
Referring Provider:  [facility]         Primary Care Physician:  [name], NP Primary Gastroenterologist:   Althia Forts Reason for Consultation:    Anemia, FOBT +               ASSESSMENT / PLAN   # 60 yo male admitted with normocytic anemia,  heme positive black stool.  Iron on home medication list but patient says he does not take it. Difficult to discern baseline hemoglobin.  History of bleeding duodenal ulcer and late April of this year.  He has continued to take NSAIDs.  He may be bleeding from known duodenal ulcer.  He could also be bleeding from AVMs.  --Review of blood transfusion history shows he got a unit of blood on 12/28/20. Hgb on 9/10 was 7.8 and now down to 7.1  --Hgb improved to 7.7 after a unit of blood in the ED.  --Will need repeat EGD, most likely will be done tomorrow. The risks and benefits of EGD with possible biopsies were discussed with the patient who agrees to proceed.   # Chronic HCV  # Elevated liver enzymes. Pattern of AST / ALT elevation suggests ETOh related.   # ESRD on HD   HISTORY OF PRESENT ILLNESS                                                                                                                         Chief Complaint:  dizziness and black stool  Daniel [name] is a 60 y.o. male with a past medical history significant for ESRD on HD Tu /Th / Sa, HTN,  DM2, chronic anemia, hx of PUD with recent bleeding duodenal ulcer, hepatitis C, diverticulosis, history of cocaine abuse, history of Etoh abuse.  See PMH for any additional medical history.    ED course:  Patient came to ED yesterday for AMS and feeling dizzy at dialysis. In ED, he was hemodynamically stable.  Hgb is 8.3 ( at baseline), FOBT + Patient has chronic anemia, previously evaluated endoscopically. 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 In late April 2022 he had  an upper endoscopy for GI bleeding.  He had a duodenal ulcer with a visible vessel which we clipped.  We wanted an H. pylori stool antigen but I do not see where that was ever obtained.  Patient says that since he had the duodenal ulcer he has continued to have generalized abdominal pain which is constant and not relieved with defecation.  He takes ibuprofen for the pain on a daily basis.  He is not on any acid blockers. He has been having black stool for a week. He says that he doesn't take oral iron though it it listed on home med list. No nausea / vomiting.    PREVIOUS ENDOSCOPIC EVALUATIONS    March 2021 Colonoscopy and  EGD for anemia and FOBT Colonoscopy --complete exam, good prep --No active bleeding or source of heme positive stool/anemia seen. - Diverticulosis in the sigmoid colon. - Probable old blood in the terminal ileum, raising the question of a small bowel source (given negative egd). - The distal rectum and anal verge are normal on retroflexion view. - No specimens collected. EGD - Normal larynx. - Normal esophagus. - Normal stomach. - Duodenal polyp vs. prominent papilla, benign-appearing. Biopsied. FINAL MICROSCOPIC DIAGNOSIS:   A. DUODENUM, BIOPSY:  - Patchy chronic duodenitis with surface gastric foveolar metaplasia,  suggestive of peptic duodenitis   B. DUODENUM, BIOPSY:  - Patchy chronic duodenitis with surface gastric foveolar metaplasia,  suggestive of peptic duodenitis   March 2021 Small bowel video capsule study --Normal study . No blood seen in 13 hours   08/02/19 Small bowel enteroscopy for intermittent dark stools.  --Esophagogastric landmarks identified. - Normal esophagus. - Normal stomach. - Prominent ampulla without overt adenomatous changes. Biopsied. - A few benign appearing duodenal polyps. Biopsied, one suspected AVM treated as above. 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 Treated with argon plasma coagulation (APC). - A  single non-bleeding angiodysplastic lesion in the proximal jejunum. Treated with argon plasma coagulation (APC).  FINAL MICROSCOPIC DIAGNOSIS:   A. AMPULLA, BIOPSY:  - Pyloric gland adenoma  - Negative for cytologic dysplasia in submitted biopsies   B. DUODENUM BULB, BIOPSY:  - Duodenal mucosa with mild reactive changes  - Negative for dysplasia, increased intraepithelial lymphocytes or  villous architectural changes    08/10/20 EGD for melena  --normal esophagus --Gastriits --Bleeding duodenal ulcer with a visible vessel. Clips were placed x 3 with apparent hemostasis. - Duodenitis with nodularity and polypoid mucosa. - No specimens collected   Past Medical History:  Diagnosis Date   Anemia    Diabetes mellitus without complication [facility])    patient denies   Dialysis patient [facility])    End stage chronic kidney disease ([place])    Hypertension    ICH (intracerebral hemorrhage) ([place]) 05/20/2017   Shoulder pain, left 06/28/2013    Past Surgical History:  Procedure Laterality Date   A/V FISTULAGRAM N/A 08/15/2020   Procedure: A/V FISTULAGRAM - Left Upper;  Surgeon: Cherre Robins, MD;  Location: [place] CV LAB;  Service: Cardiovascular;  Laterality: N/A;   APPENDECTOMY     AV FISTULA PLACEMENT Left 08/03/2019   Procedure: LEFT ARM ARTERIOVENOUS (AV) CEPHALIC  FISTULA CREATION;  Surgeon: Waynetta Sandy, MD;  Location: [place];  Service: Vascular;  Laterality: Left;   BIOPSY  06/30/2019   Procedure: BIOPSY;  Surgeon: Ronald Lobo, MD;  Location: [place];  Service: Endoscopy;;   BIOPSY  08/02/2019   Procedure: BIOPSY;  Surgeon: Yetta Flock, MD;  Location: [place];  Service: Gastroenterology;;   COLONOSCOPY  01/23/2012   Procedure: COLONOSCOPY;  Surgeon: Danie Binder, MD;  [facility];  Service: Endoscopy;  Laterality: N/A;  11:10 AM   COLONOSCOPY WITH PROPOFOL N/A 06/30/2019   Procedure: COLONOSCOPY WITH PROPOFOL;  Surgeon: Ronald Lobo, MD;   Location: [place];  Service: Endoscopy;  Laterality: N/A;   ENTEROSCOPY N/A 08/02/2019   Procedure: ENTEROSCOPY;  Surgeon: Yetta Flock, MD;  Location: [facility] ENDOSCOPY;  Service: Gastroenterology;  Laterality: N/A;   ESOPHAGOGASTRODUODENOSCOPY N/A 08/10/2020   Procedure: ESOPHAGOGASTRODUODENOSCOPY (EGD);  Surgeon: Jerene Bears, MD;  Location: [facility] ENDOSCOPY;  Service: Gastroenterology;  Laterality: N/A;   ESOPHAGOGASTRODUODENOSCOPY (EGD) WITH PROPOFOL N/A 06/30/2019  Procedure: ESOPHAGOGASTRODUODENOSCOPY (EGD) WITH PROPOFOL;  Surgeon: Ronald Lobo, MD;  Location: [place];  Service: Endoscopy;  Laterality: N/A;   FISTULA SUPERFICIALIZATION Left 10/17/2019   Procedure: LEFT UPPER EXTREMITY FISTULA REVISION, SIDE BRANCH LIGATION,  AND SUPERFICIALIZATION;  Surgeon: Marty Heck, MD;  Location: [place];  Service: Vascular;  Laterality: Left;   GIVENS CAPSULE STUDY N/A 06/30/2019   Procedure: GIVENS CAPSULE STUDY;  Surgeon: Ronald Lobo, MD;  Location: [place];  Service: Endoscopy;  Laterality: N/A;   HEMOSTASIS CLIP PLACEMENT  08/10/2020   Procedure: HEMOSTASIS CLIP PLACEMENT;  Surgeon: Jerene Bears, MD;  Location: [place] ENDOSCOPY;  Service: Gastroenterology;;   HEMOSTASIS CONTROL  08/02/2019   Procedure: HEMOSTASIS CONTROL;  Surgeon: Yetta Flock, MD;  Location: [place] ENDOSCOPY;  Service: Gastroenterology;;   INCISION AND DRAINAGE ABSCESS N/A 06/29/2016   Procedure: INCISION AND DRAINAGE ABDOMINAL WALL ABSCESS;  Surgeon: Alphonsa Overall, MD;  Location: [facility];  Service: General;  Laterality: N/A;   INSERTION OF DIALYSIS CATHETER Right 08/03/2019   Procedure: INSERTION OF DIALYSIS CATHETER;  Surgeon: Waynetta Sandy, MD;  Location: [place];  Service: Vascular;  Laterality: Right;   INSERTION OF DIALYSIS CATHETER Right 10/22/2019   Procedure: INSERTION OF 23CM TUNNELED DIALYSIS CATHETER RIGHT INTERNAL JUGULAR;  Surgeon: Angelia Mould, MD;  Location: [place];  Service:  Vascular;  Laterality: Right;   INSERTION OF DIALYSIS CATHETER Right 08/12/2020   Procedure: INSERTION OF Right internal Jugular TUNNELED  DIALYSIS CATHETER.;  Surgeon: Waynetta Sandy, MD;  Location: [place];  Service: Vascular;  Laterality: Right;   Left heel surgery     PENILE BIOPSY N/A 03/26/2020   Procedure: PENILE ULCER DEBRIDEMENT;  Surgeon: Remi Haggard, MD;  Location: [facility];  Service: Urology;  Laterality: N/A;  23 MINS    Prior to Admission medications   Medication Sig Start Date End Date Taking? Authorizing Provider  amLODipine (NORVASC) 10 MG tablet Take 10 mg by mouth at bedtime. 01/01/21   [provider]  augmented betamethasone dipropionate (DIPROLENE-AF) 0.05 % cream Apply 1 application topically 2 (two) times daily as needed. 01/03/21   [provider]  bacitracin ointment Apply 1 application topically 2 (two) times daily. 01/05/21   Regan Lemming, MD  blood glucose meter kit and supplies KIT Dispense based on patient and insurance preference. Use up to four times daily as directed. (FOR ICD-9 250.00, 250.01). 07/01/16   Nita Sells, MD  Calcium Acetate 667 MG TABS Take 1,334 mg by mouth 4 (four) times daily. 09/11/20   [provider]  cetirizine (ZYRTEC ALLERGY) 10 MG tablet Take 1 tablet (10 mg total) by mouth daily as needed for allergies. 08/22/20   [name], NP  cloNIDine (CATAPRES) 0.1 MG tablet SMARTSIG:1 Tablet(s) By Mouth 01/01/21   [provider]  cloNIDine (CATAPRES) 0.2 MG tablet Take  1 tablet twice daily . 10/28/20   Donnamae Jude, MD  doxepin (SINEQUAN) 10 MG capsule Take 10 mg by mouth at bedtime. 01/03/21   [provider]  gabapentin (NEURONTIN) 100 MG capsule Take 1 capsule (100 mg total) by mouth at bedtime. 08/16/20   Florencia Reasons, MD  hydrOXYzine (ATARAX/VISTARIL) 25 MG tablet TAKE 1 TABLET BY MOUTH EVERY 8 HOURS AS NEEDED FOR ANXIETY OR  ITCHING 11/07/20   [name], NP   lidocaine-prilocaine (EMLA) cream APPLY SMALL AMOUNT TO ACCESS SITE (AVF) 1 TO 2 HOURS BEFORE DIALYSIS. COVER WITH OCCLUSIVE DRESSING (SARAN WRAP) 12/27/20   [provider]  magic mouthwash (nystatin, lidocaine, diphenhydrAMINE, alum & mag hydroxide) suspension Swish and spit 5 mLs 4 (four) times daily as needed for mouth pain. 12/12/20   Hazel Sams, PA-C  nitroGLYCERIN (NITRODUR - DOSED IN MG/24 HR) 0.2 mg/hr patch Use 1/4 patch daily to the affected area 12/12/20   Hudnall, Sharyn Lull, MD  nystatin (MYCOSTATIN) 100000 UNIT/ML suspension Take by mouth. 12/12/20   [provider]  oxyCODONE-acetaminophen (PERCOCET/ROXICET) 5-325 MG tablet Take 1 tablet by mouth every 8 (eight) hours as needed for severe pain. 11/12/20   Jaynee Eagles, PA-C  pantoprazole (PROTONIX) 40 MG tablet Take 1 tablet by mouth twice daily 10/30/20   [name], NP  triamcinolone cream (KENALOG) 0.1 % Apply 1 application topically 2 (two) times daily. 08/02/20   [provider]  colchicine 0.6 MG tablet Take 0.5 tablets (0.3 mg total) by mouth 2 (two) times daily. 07/24/20 07/29/20  Noemi Chapel, MD  ferrous sulfate 325 (65 FE) MG tablet SMARTSIG:1 Tablet(s) By Mouth Twice Daily 08/11/19 02/09/20  [provider]  furosemide (LASIX) 40 MG tablet Take 1 tablet (40 mg total) by mouth daily. 07/25/19 02/09/20  Ladona Horns, MD    Current Facility-Administered Medications  Medication Dose Route Frequency Provider Last Rate Last Admin   0.9 %  sodium chloride infusion  10 mL/hr Intravenous Once Lajean Saver, MD       0.9 %  sodium chloride infusion  250 mL Intravenous PRN Orma Flaming, MD       calcium acetate (PHOSLO) capsule 1,334 mg  1,334 mg Oral TID AC & HS Orma Flaming, MD   1,334 mg at 01/11/21 0815   gabapentin (NEURONTIN) capsule 100 mg  100 mg Oral QHS Orma Flaming, MD   100 mg at 01/11/21 0050   hydrOXYzine (ATARAX/VISTARIL) tablet 25 mg  25 mg Oral TID PRN Orma Flaming, MD        insulin aspart (novoLOG) injection 0-6 Units  0-6 Units Subcutaneous TID WC Orma Flaming, MD       nicotine (NICODERM CQ - dosed in mg/24 hours) patch 21 mg  21 mg Transdermal Daily Orma Flaming, MD   21 mg at 01/10/21 2237   pantoprazole (PROTONIX) EC tablet 40 mg  40 mg Oral BID Orma Flaming, MD   40 mg at 01/11/21 0050   sodium chloride flush (NS) 0.9 % injection 3 mL  3 mL Intravenous Q12H Orma Flaming, MD   3 mL at 01/10/21 2235   sodium chloride flush (NS) 0.9 % injection 3 mL  3 mL Intravenous PRN Orma Flaming, MD       Current Outpatient Medications  Medication Sig Dispense Refill   amLODipine (NORVASC) 10 MG tablet Take 10 mg by mouth at bedtime.     augmented betamethasone dipropionate (DIPROLENE-AF) 0.05 % cream Apply 1 application topically 2 (two) times daily as needed.     bacitracin ointment Apply 1 application topically 2 (two) times daily. 120 g 0   blood glucose meter kit and supplies KIT Dispense based on patient and insurance preference. Use up to four times daily as directed. (FOR ICD-9 250.00, 250.01). 1 each 0   Calcium Acetate 667 MG TABS Take 1,334 mg by mouth 4 (four) times daily.     cetirizine (ZYRTEC ALLERGY) 10 MG tablet Take 1 tablet (10 mg total) by mouth daily as needed for allergies. 90 tablet 1   cloNIDine (CATAPRES) 0.1 MG tablet SMARTSIG:1 Tablet(s) By Mouth  cloNIDine (CATAPRES) 0.2 MG tablet Take  1 tablet twice daily . 180 tablet 1   doxepin (SINEQUAN) 10 MG capsule Take 10 mg by mouth at bedtime.     gabapentin (NEURONTIN) 100 MG capsule Take 1 capsule (100 mg total) by mouth at bedtime. 30 capsule 0   hydrOXYzine (ATARAX/VISTARIL) 25 MG tablet TAKE 1 TABLET BY MOUTH EVERY 8 HOURS AS NEEDED FOR ANXIETY OR  ITCHING 90 tablet 0   lidocaine-prilocaine (EMLA) cream APPLY SMALL AMOUNT TO ACCESS SITE (AVF) 1 TO 2 HOURS BEFORE DIALYSIS. COVER WITH OCCLUSIVE DRESSING (SARAN WRAP)     magic mouthwash (nystatin, lidocaine, diphenhydrAMINE, alum & mag  hydroxide) suspension Swish and spit 5 mLs 4 (four) times daily as needed for mouth pain. 180 mL 0   nitroGLYCERIN (NITRODUR - DOSED IN MG/24 HR) 0.2 mg/hr patch Use 1/4 patch daily to the affected area 30 patch 1   nystatin (MYCOSTATIN) 100000 UNIT/ML suspension Take by mouth.     oxyCODONE-acetaminophen (PERCOCET/ROXICET) 5-325 MG tablet Take 1 tablet by mouth every 8 (eight) hours as needed for severe pain. 10 tablet 0   pantoprazole (PROTONIX) 40 MG tablet Take 1 tablet by mouth twice daily 60 tablet 0   triamcinolone cream (KENALOG) 0.1 % Apply 1 application topically 2 (two) times daily.      Allergies as of 01/10/2021 - Review Complete 01/10/2021  Allergen Reaction Noted   Dilaudid [hydromorphone hcl] Itching and Other (See Comments) 07/29/2020    Family History  Problem Relation Age of Onset   Colon cancer Neg Hx     Social History   Socioeconomic History   Marital status: Single    Spouse name: Not on file   Number of children: Not on file   Years of education: Not on file   Highest education level: Not on file  Occupational History   Not on file  Tobacco Use   Smoking status: Every Day    Packs/day: 1.00    Years: 30.00    Pack years: 30.00    Types: Cigarettes   Smokeless tobacco: Never  Vaping Use   Vaping Use: Never used  Substance and Sexual Activity   Alcohol use: Not Currently    Alcohol/week: 12.0 standard drinks    Types: 12 Cans of beer per week   Drug use: Not Currently    Types: "Crack" cocaine    Comment: last in 2020   Sexual activity: Yes    Birth control/protection: None  Other Topics Concern   Not on file  Social History Narrative   Not on file   Social Determinants of Health   Financial Resource Strain: Not on file  Food Insecurity: Not on file  Transportation Needs: Not on file  Physical Activity: Not on file  Stress: Not on file  Social Connections: Not on file  Intimate Partner Violence: Not on file    Review of  Systems: Positive for shortness of breath and dizziness. All other systems reviewed and negative except where noted in HPI.  OBJECTIVE    Physical Exam: Vital signs in last 24 hours: Temp:  [97.6 F (36.4 C)-98.3 F (36.8 C)] 98 F (36.7 C) (09/16 0320) Pulse Rate:  [76-84] 84 (09/16 0830) Resp:  [12-34] 19 (09/16 0830) BP: (109-157)/(52-91) 118/68 (09/16 0830) SpO2:  [95 %-100 %] 99 % (09/16 0830) Weight:  [120.2 kg] 120.2 kg (09/15 1236)   General:   Alert  male in NAD Psych:  Cooperative. Normal mood and affect. Eyes:  Pupils equal, sclera clear, no icterus.   Conjunctiva pink. Ears:  Normal auditory acuity. Nose:  No deformity, discharge,  or lesions. Neck:  Supple; no masses Lungs:  Clear throughout to auscultation.    Heart:  Regular rate and rhythm;  no lower extremity edema Abdomen:  Soft, obese, nontender, BS active, no palp mass   Rectal:  Deferred  Msk:  Symmetrical without gross deformities. . Neurologic:  Alert and  oriented x4;  grossly normal neurologically. Skin:  Intact without significant lesions or rashes.   Scheduled inpatient medications  calcium acetate  1,334 mg Oral TID AC & HS   gabapentin  100 mg Oral QHS   insulin aspart  0-6 Units Subcutaneous TID WC   nicotine  21 mg Transdermal Daily   pantoprazole  40 mg Oral BID   sodium chloride flush  3 mL Intravenous Q12H      Intake/Output from previous day: 09/15 0701 - 09/16 0700 In: 315 [Blood:315] Out: -  Intake/Output this shift: No intake/output data recorded.   Lab Results: Recent Labs    01/10/21 1306 01/10/21 2118 01/10/21 2333 01/11/21 0458  WBC 4.7  --   --  5.3  HGB 7.7* 8.1* 7.9* 8.3*  HCT 24.3* 25.4* 25.0* 25.9*  PLT 124*  --   --  111*   BMET Recent Labs    01/10/21 1306 01/11/21 0458  NA 139 139  K 3.5 4.7  CL 95* 90*  CO2 31 30  GLUCOSE 92 96  BUN 20 26*  CREATININE 5.59* 7.29*  CALCIUM 7.9* 8.5*   LFT Recent Labs    01/11/21 0458  PROT 7.3  ALBUMIN  2.4*  AST 123*  ALT 52*  ALKPHOS 75  BILITOT 1.1   PT/INR No results for input(s): LABPROT, INR in the last 72 hours. Hepatitis Panel Recent Labs    01/10/21 2051  HEPBSAG NON REACTIVE  HCVAB Reactive*  HEPAIGM NON REACTIVE  HEPBIGM NON REACTIVE     . CBC Latest Ref Rng & Units 01/11/2021 01/10/2021 01/10/2021  WBC 4.0 - 10.5 K/uL 5.3 - -  Hemoglobin 13.0 - 17.0 g/dL 8.3(L) 7.9(L) 8.1(L)  Hematocrit 39.0 - 52.0 % 25.9(L) 25.0(L) 25.4(L)  Platelets 150 - 400 K/uL 111(L) - -    . CMP Latest Ref Rng & Units 01/11/2021 01/10/2021 01/06/2021  Glucose 70 - 99 mg/dL 96 92 186(H)  BUN 8 - 23 mg/dL 26(H) 20 70(H)  Creatinine 0.61 - 1.24 mg/dL 7.29(H) 5.59(H) 13.36(H)  Sodium 135 - 145 mmol/L 139 139 139  Potassium 3.5 - 5.1 mmol/L 4.7 3.5 5.0  Chloride 98 - 111 mmol/L 90(L) 95(L) 98  CO2 22 - 32 mmol/L 30 31 23   Calcium 8.9 - 10.3 mg/dL 8.5(L) 7.9(L) 8.6(L)  Total Protein 6.5 - 8.1 g/dL 7.3 7.7 7.2  Total Bilirubin 0.3 - 1.2 mg/dL 1.1 0.7 0.8  Alkaline Phos 38 - 126 U/L 75 82 68  AST 15 - 41 U/L 123(H) 126(H) 102(H)  ALT 0 - 44 U/L 52(H) 57(H) 45(H)   Studies/Results: CT HEAD WO CONTRAST (5MM)  Result Date: 01/10/2021 CLINICAL DATA:  Altered mental status. EXAM: CT HEAD WITHOUT CONTRAST TECHNIQUE: Contiguous axial images were obtained from the base of the skull through the vertex without intravenous contrast. COMPARISON:  January 05, 2021. FINDINGS: Brain: No evidence of acute infarction, hemorrhage, hydrocephalus, extra-axial collection or mass lesion/mass effect. Vascular: No hyperdense vessel or unexpected calcification. Skull: Normal. Negative for fracture or focal lesion. Sinuses/Orbits: No acute finding.  Other: None. IMPRESSION: No acute intracranial abnormality seen. Electronically Signed   By: Marijo Conception M.D.   On: 01/10/2021 15:17    Principal Problem:   Symptomatic anemia Active Problems:   Essential hypertension   DM2 (diabetes mellitus, type 2) (HCC)   Iron  deficiency anemia   GI bleed   ESRD on hemodialysis ([place])   Transaminitis    Tye Savoy, NP-C @  01/11/2021, 9:16 AM

## 2021-01-12 ENCOUNTER — Encounter (HOSPITAL_COMMUNITY)
Admission: EM | Disposition: A | Discharge status: Home / Self Care | Payer: Self-pay | Source: Home / Self Care | Attending: Internal Medicine

## 2021-01-12 ENCOUNTER — Inpatient Hospital Stay (HOSPITAL_COMMUNITY): Payer: Medicare Other | Admitting: Anesthesiology

## 2021-01-12 ENCOUNTER — Encounter (HOSPITAL_COMMUNITY): Payer: Self-pay | Admitting: Internal Medicine

## 2021-01-12 DIAGNOSIS — K254 Chronic or unspecified gastric ulcer with hemorrhage: Principal | ICD-10-CM

## 2021-01-12 DIAGNOSIS — K2270 Barrett's esophagus without dysplasia: Secondary | ICD-10-CM

## 2021-01-12 DIAGNOSIS — K3189 Other diseases of stomach and duodenum: Secondary | ICD-10-CM

## 2021-01-12 HISTORY — PX: ESOPHAGOGASTRODUODENOSCOPY (EGD) WITH PROPOFOL: SHX5813

## 2021-01-12 HISTORY — PX: HEMOSTASIS CLIP PLACEMENT: SHX6857

## 2021-01-12 HISTORY — PX: SCLEROTHERAPY: SHX6841

## 2021-01-12 LAB — RETICULOCYTES
Immature Retic Fract: 39.4 % — ABNORMAL HIGH (ref 2.3–15.9)
RBC.: 2.25 MIL/uL — ABNORMAL LOW (ref 4.22–5.81)
Retic Count, Absolute: 60.8 10*3/uL (ref 19.0–186.0)
Retic Ct Pct: 2.7 % (ref 0.4–3.1)

## 2021-01-12 LAB — CBC
HCT: 25.3 % — ABNORMAL LOW (ref 39.0–52.0)
Hemoglobin: 8.1 g/dL — ABNORMAL LOW (ref 13.0–17.0)
MCH: 36 pg — ABNORMAL HIGH (ref 26.0–34.0)
MCHC: 32 g/dL (ref 30.0–36.0)
MCV: 112.4 fL — ABNORMAL HIGH (ref 80.0–100.0)
Platelets: 113 10*3/uL — ABNORMAL LOW (ref 150–400)
RBC: 2.25 MIL/uL — ABNORMAL LOW (ref 4.22–5.81)
RDW: 23.1 % — ABNORMAL HIGH (ref 11.5–15.5)
WBC: 4.8 10*3/uL (ref 4.0–10.5)
nRBC: 1.9 % — ABNORMAL HIGH (ref 0.0–0.2)

## 2021-01-12 LAB — IRON AND TIBC
Iron: 83 ug/dL (ref 45–182)
Saturation Ratios: 21 % (ref 17.9–39.5)
TIBC: 386 ug/dL (ref 250–450)
UIBC: 303 ug/dL

## 2021-01-12 LAB — BASIC METABOLIC PANEL
Anion gap: 17 — ABNORMAL HIGH (ref 5–15)
BUN: 38 mg/dL — ABNORMAL HIGH (ref 8–23)
CO2: 28 mmol/L (ref 22–32)
Calcium: 9 mg/dL (ref 8.9–10.3)
Chloride: 94 mmol/L — ABNORMAL LOW (ref 98–111)
Creatinine, Ser: 8.93 mg/dL — ABNORMAL HIGH (ref 0.61–1.24)
GFR, Estimated: 6 mL/min — ABNORMAL LOW (ref >60)
Glucose, Bld: 158 mg/dL — ABNORMAL HIGH (ref 70–99)
Potassium: 4.2 mmol/L (ref 3.5–5.1)
Sodium: 139 mmol/L (ref 135–145)

## 2021-01-12 LAB — VITAMIN B12: Vitamin B-12: 492 pg/mL (ref 180–914)

## 2021-01-12 LAB — GLUCOSE, CAPILLARY
Glucose-Capillary: 114 mg/dL — ABNORMAL HIGH (ref 70–99)
Glucose-Capillary: 123 mg/dL — ABNORMAL HIGH (ref 70–99)
Glucose-Capillary: 188 mg/dL — ABNORMAL HIGH (ref 70–99)
Glucose-Capillary: 118 mg/dL — ABNORMAL HIGH (ref 70–99)
Glucose-Capillary: 187 mg/dL — ABNORMAL HIGH (ref 70–99)

## 2021-01-12 LAB — FOLATE: Folate: 10.3 ng/mL (ref >5.9)

## 2021-01-12 LAB — FERRITIN: Ferritin: 138 ng/mL (ref 24–336)

## 2021-01-12 LAB — SURGICAL PCR SCREEN
MRSA, PCR: NEGATIVE
Staphylococcus aureus: POSITIVE — AB

## 2021-01-12 SURGERY — ESOPHAGOGASTRODUODENOSCOPY (EGD) WITH PROPOFOL
Anesthesia: Monitor Anesthesia Care

## 2021-01-12 MED ORDER — FENTANYL CITRATE (PF) 100 MCG/2ML IJ SOLN: 25.0000 ug | INTRAMUSCULAR | Status: DC | PRN

## 2021-01-12 MED ORDER — EPINEPHRINE 1 MG/10ML IJ SOSY
PREFILLED_SYRINGE | INTRAMUSCULAR | Status: AC
  Filled 2021-01-12: qty 10

## 2021-01-12 MED ORDER — ACETAMINOPHEN 10 MG/ML IV SOLN: 1000.0000 mg | Freq: Once | INTRAVENOUS | Status: DC | PRN

## 2021-01-12 MED ORDER — PROPOFOL 500 MG/50ML IV EMUL
INTRAVENOUS | Status: DC | PRN
  Administered 2021-01-12: 125 ug/kg/min via INTRAVENOUS

## 2021-01-12 MED ORDER — PROPOFOL 10 MG/ML IV BOLUS
INTRAVENOUS | Status: DC | PRN
  Administered 2021-01-12 (×2): 20 mg via INTRAVENOUS

## 2021-01-12 MED ORDER — SUCRALFATE 1 GM/10ML PO SUSP
1.0000 g | Freq: Three times a day (TID) | ORAL | Status: DC
  Administered 2021-01-12 – 2021-01-13 (×4): 1 g via ORAL
  Filled 2021-01-12 (×4): qty 10

## 2021-01-12 MED ORDER — FENTANYL CITRATE (PF) 100 MCG/2ML IJ SOLN
INTRAMUSCULAR | Status: AC
  Filled 2021-01-12: qty 2

## 2021-01-12 MED ORDER — SODIUM CHLORIDE 0.9 % IV SOLN: INTRAVENOUS | Status: DC | PRN

## 2021-01-12 MED ORDER — SODIUM CHLORIDE (PF) 0.9 % IJ SOLN
PREFILLED_SYRINGE | INTRAMUSCULAR | Status: DC | PRN
  Administered 2021-01-12: 2 mL

## 2021-01-12 MED ORDER — PROMETHAZINE HCL 25 MG/ML IJ SOLN: 6.2500 mg | INTRAMUSCULAR | Status: DC | PRN

## 2021-01-12 MED ORDER — FENTANYL CITRATE (PF) 100 MCG/2ML IJ SOLN
25.0000 ug | Freq: Once | INTRAMUSCULAR | Status: AC
  Administered 2021-01-12: 25 ug via INTRAVENOUS

## 2021-01-12 MED ORDER — SODIUM CHLORIDE 0.9 % IV SOLN: INTRAVENOUS | Status: DC

## 2021-01-12 MED ORDER — LIDOCAINE 2% (20 MG/ML) 5 ML SYRINGE
INTRAMUSCULAR | Status: DC | PRN
  Administered 2021-01-12: 40 mg via INTRAVENOUS

## 2021-01-12 SURGICAL SUPPLY — 15 items

## 2021-01-12 NOTE — Progress Notes (Signed)
PROGRESS NOTE    Daniel [name]   BDZ:[id number]  DOB: 08/13/1960  DOA: 01/10/2021 PCP: [name], NP   Brief Narrative:  Daniel Peer Sandiferis a 60 y.o. male with medical history significant of  ESRD (Tues, Thurs, Saturday), hypertension, T2DM, multifactorial anemia (with recent bleeding duodenal ulcer 07/2020), penile calciphylaxis, alcohol abuse in remission, cocaine abuse in remission and nicotine dependence who presents to ED from dialysis for "AMS" during and after dialysis per triage notes. He was noted to have a BP of 88/56 when EMS arrive and he was given a small NS bolus. He was alert and oriented by slow to respond. The patient states he completed a full dialysis.   In ED Hb 7.7. FOBT +.    Subjective: He has no complaints today.     Assessment & Plan:   Principal Problem:   Symptomatic anemia due to GI bleeding - the patient admits to black stools -On 08/01/2020 the patient underwent a small bowel endoscopy which revealed APCs in the duodenum and jejunum. -He underwent an EGD and enteroscopy 08/10/20 which showed a 9 mm cratered duodenal ulcer with bleeding that was treated 3 hemostatic clips.  He also was noted to have gastritis. -Appreciate GI eval, will follow for plan - We will transfuse to keep hemoglobin close to 8- he has received 1 U PRBC so far - cont Protonix BID -He is hemodynamically stable without tachycardia and has a mildly elevated blood pressure -Of note ferritin was 13 in 2020, 47 on March 06/2019 followed by 313 on March 28 -  EGD > Oozing gastric ulcer with oozing hemorrhage                            (Forrest Class Ib). Injected. Clips were placed.                           - Nodular mucosa in the duodenal bulb.                           - No specimens collected. - resume diet today- cont PPI BID x 3 months, check H pylori serum antibody, add Sucralfate, repeat EGD in 2-3 months  Active Problems:     ESRD on hemodialysis-dialysis  Tuesday Thursday Saturday -He had a full dialysis treatment on Thursday - have notified nephrology of his admission    Transaminitis  History of chronic HCV - AST 123, ALT 52-not a new finding     Essential hypertension -Holding amlodipine, clonidine in setting of acute bleeding - cont clonidine at 0.1 mg BID instead of 0.2 mg BID (with holding parameters)     DM2 (diabetes mellitus, type 2) ([place]) -Does not appear to be on any medications for this at home Hemoglobin A1C    Component Value Date/Time   HGBA1C 5.4 09/26/2020 0930   HGBA1C 6.4 (H) 03/26/2020 1022     Time spent in minutes: 35 DVT prophylaxis: SCDs Start: 01/10/21 1930  Code Status: Full code Family Communication:  Level of Care: Level of care: Telemetry Medical Disposition Plan:  Status is: inpatient  Inpatient because: IV treatments appropriate due to intensity of illness or inability to take PO and Inpatient level of care appropriate due to severity of illness  Dispo: The patient is from: Home  Anticipated d/c is to: Home              Patient currently is not medically stable to d/c.   Difficult to place patient No  Consultants:  GI Procedures:  EGD 9/16 Antimicrobials:  Anti-infectives (From admission, onward)    None        Objective: Vitals:   01/12/21 0829 01/12/21 0834 01/12/21 0906 01/12/21 1237  BP: (!) 133/50 (!) 165/69 (!) 140/49 (!) 139/53  Pulse: 92 90 86 84  Resp:  20 15 15   Temp:  98.2 F (36.8 C) 98.7 F (37.1 C) 98 F (36.7 C)  TempSrc:   Oral Oral  SpO2: 98% 98% 98% 97%  Weight:      Height:        Intake/Output Summary (Last 24 hours) at 01/12/2021 1407 Last data filed at 01/12/2021 0756 Gross per 24 hour  Intake 340 ml  Output 275 ml  Net 65 ml    Filed Weights   01/10/21 1236  Weight: 120.2 kg    Examination: General exam: Appears comfortable  HEENT: PERRLA, oral mucosa moist, no sclera icterus or thrush Respiratory system: Clear to  auscultation. Respiratory effort normal. Cardiovascular system: S1 & S2 heard, regular rate and rhythm Gastrointestinal system: Abdomen soft, non-tender, nondistended. Normal bowel sounds   Central nervous system: Alert and oriented. No focal neurological deficits. Extremities: No cyanosis, clubbing or edema Skin: No rashes or ulcers Psychiatry:  Mood & affect appropriate.       Data Reviewed: I have personally reviewed following labs and imaging studies  CBC: Recent Labs  Lab 01/06/21 1804 01/10/21 1306 01/10/21 2118 01/10/21 2333 01/11/21 0458 01/11/21 0802 01/12/21 0056  WBC 6.6 4.7  --   --  5.3  --  4.8  NEUTROABS 3.5  --   --   --   --   --   --   HGB 7.1* 7.7* 8.1* 7.9* 8.3* 8.5* 8.1*  HCT 22.7* 24.3* 25.4* 25.0* 25.9* 26.6* 25.3*  MCV 119.5* 116.8*  --   --  112.1*  --  112.4*  PLT 152 124*  --   --  111*  --  113*    Basic Metabolic Panel: Recent Labs  Lab 01/06/21 1804 01/10/21 1306 01/11/21 0458 01/12/21 0056  NA 139 139 139 139  K 5.0 3.5 4.7 4.2  CL 98 95* 90* 94*  CO2 23 31 30 28   GLUCOSE 186* 92 96 158*  BUN 70* 20 26* 38*  CREATININE 13.36* 5.59* 7.29* 8.93*  CALCIUM 8.6* 7.9* 8.5* 9.0    GFR: Estimated Creatinine Clearance: 11.5 mL/min (A) (by C-G formula based on SCr of 8.93 mg/dL (H)). Liver Function Tests: Recent Labs  Lab 01/06/21 1804 01/10/21 1306 01/11/21 0458  AST 102* 126* 123*  ALT 45* 57* 52*  ALKPHOS 68 82 75  BILITOT 0.8 0.7 1.1  PROT 7.2 7.7 7.3  ALBUMIN 2.5* 2.6* 2.4*    No results for input(s): LIPASE, AMYLASE in the last 168 hours. Recent Labs  Lab 01/07/21 1006 01/10/21 2118  AMMONIA 56* 34    Coagulation Profile: No results for input(s): INR, PROTIME in the last 168 hours. Cardiac Enzymes: No results for input(s): CKTOTAL, CKMB, CKMBINDEX, TROPONINI in the last 168 hours. BNP (last 3 results) No results for input(s): PROBNP in the last 8760 hours. HbA1C: No results for input(s): HGBA1C in the last 72  hours. CBG: Recent Labs  Lab 01/11/21 1650 01/11/21 2034 01/12/21 0806 01/12/21 0857 01/12/21  1237  GLUCAP 95 140* 114* 123* 188*    Lipid Profile: No results for input(s): CHOL, HDL, LDLCALC, TRIG, CHOLHDL, LDLDIRECT in the last 72 hours. Thyroid Function Tests: No results for input(s): TSH, T4TOTAL, FREET4, T3FREE, THYROIDAB in the last 72 hours. Anemia Panel: Recent Labs    01/12/21 0056  VITAMINB12 492  FOLATE 10.3  FERRITIN 138  TIBC 386  IRON 83  RETICCTPCT 2.7   Urine analysis:    Component Value Date/Time   COLORURINE YELLOW 07/31/2019 2128   APPEARANCEUR CLEAR 07/31/2019 2128   LABSPEC 1.009 07/31/2019 2128   PHURINE 5.0 07/31/2019 2128   GLUCOSEU NEGATIVE 07/31/2019 2128   HGBUR SMALL (A) 07/31/2019 2128   BILIRUBINUR NEGATIVE 07/31/2019 2128   KETONESUR NEGATIVE 07/31/2019 2128   PROTEINUR 100 (A) 07/31/2019 2128   UROBILINOGEN 0.2 12/31/2009 0746   NITRITE NEGATIVE 07/31/2019 2128   LEUKOCYTESUR NEGATIVE 07/31/2019 2128   Sepsis Labs: @LABRCNTIP (procalcitonin:4,lacticidven:4) ) Recent Results (from the past 240 hour(s))  SARS CORONAVIRUS 2 (TAT 6-24 HRS) Nasopharyngeal Nasopharyngeal Swab     Status: None   Collection Time: 01/10/21  6:58 PM   Specimen: Nasopharyngeal Swab  Result Value Ref Range Status   SARS Coronavirus 2 NEGATIVE NEGATIVE Final    Comment: (NOTE) SARS-CoV-2 target nucleic acids are NOT DETECTED.  The SARS-CoV-2 RNA is generally detectable in upper and lower respiratory specimens during the acute phase of infection. Negative results do not preclude SARS-CoV-2 infection, do not rule out co-infections with other pathogens, and should not be used as the sole basis for treatment or other patient management decisions. Negative results must be combined with clinical observations, patient history, and epidemiological information. The expected result is Negative.  Fact Sheet for  Patients: SugarRoll.be  Fact Sheet for Healthcare Providers: https://www.woods-mathews.com/  This test is not yet approved or cleared by the [place] FDA and  has been authorized for detection and/or diagnosis of SARS-CoV-2 by FDA under an Emergency Use Authorization (EUA). This EUA will remain  in effect (meaning this test can be used) for the duration of the COVID-19 declaration under Se ction 564(b)(1) of the Act, 21 U.S.C. section 360bbb-3(b)(1), unless the authorization is terminated or revoked sooner.  Performed at [facility], [place] [address], [place], [place] [address]   Surgical PCR screen     Status: Abnormal   Collection Time: 01/11/21  9:48 PM   Specimen: Nasal Mucosa; Nasal Swab  Result Value Ref Range Status   MRSA, PCR NEGATIVE NEGATIVE Final   Staphylococcus aureus POSITIVE (A) NEGATIVE Final    Comment: (NOTE) The Xpert SA Assay (FDA approved for NASAL specimens in patients 33 years of age and older), is one component of a comprehensive surveillance program. It is not intended to diagnose infection nor to guide or monitor treatment. Performed at [facility], [place] [address], [place], [place] [address]          Radiology Studies: CT HEAD WO CONTRAST (5MM)  Result Date: 01/10/2021 CLINICAL DATA:  Altered mental status. EXAM: CT HEAD WITHOUT CONTRAST TECHNIQUE: Contiguous axial images were obtained from the base of the skull through the vertex without intravenous contrast. COMPARISON:  January 05, 2021. FINDINGS: Brain: No evidence of acute infarction, hemorrhage, hydrocephalus, extra-axial collection or mass lesion/mass effect. Vascular: No hyperdense vessel or unexpected calcification. Skull: Normal. Negative for fracture or focal lesion. Sinuses/Orbits: No acute finding. Other: None. IMPRESSION: No acute intracranial abnormality seen. Electronically Signed   By: Marijo Conception  M.D.   On: 01/10/2021  15:17      Scheduled Meds:  calcium acetate  1,334 mg Oral TID AC & HS   Chlorhexidine Gluconate Cloth  6 each Topical Daily   cloNIDine  0.1 mg Oral BID   gabapentin  100 mg Oral QHS   insulin aspart  0-6 Units Subcutaneous TID WC   mupirocin ointment  1 application Nasal BID   nicotine  21 mg Transdermal Daily   pantoprazole  40 mg Oral BID   sodium chloride flush  3 mL Intravenous Q12H   sucralfate  1 g Oral TID WC & HS   Continuous Infusions:  sodium chloride     sodium chloride     sodium thiosulfate infusion for calciphylaxis       LOS: 1 day      Debbe Odea, MD [facility] Pager: [email or web address] 01/12/2021, 2:07 PM

## 2021-01-12 NOTE — Anesthesia Postprocedure Evaluation (Signed)
Anesthesia Post Note  Patient: Daniel [name]  Procedure(s) Performed: ESOPHAGOGASTRODUODENOSCOPY (EGD) WITH PROPOFOL HEMOSTASIS CLIP PLACEMENT SCLEROTHERAPY     Patient location during evaluation: PACU Anesthesia Type: MAC Level of consciousness: awake and alert Pain management: pain level controlled Vital Signs Assessment: post-procedure vital signs reviewed and stable Respiratory status: spontaneous breathing, nonlabored ventilation, respiratory function stable and patient connected to nasal cannula oxygen Cardiovascular status: stable and blood pressure returned to baseline Postop Assessment: no apparent nausea or vomiting Anesthetic complications: no   No notable events documented.  Last Vitals:  Vitals:   01/12/21 0906 01/12/21 1237  BP: (!) 140/49 (!) 139/53  Pulse: 86 84  Resp: 15 15  Temp: 37.1 C 36.7 C  SpO2: 98% 97%    Last Pain:  Vitals:   01/12/21 1237  TempSrc: Oral  PainSc:                  [name] [name]

## 2021-01-12 NOTE — Plan of Care (Signed)

## 2021-01-12 NOTE — Op Note (Signed)
[facility] Patient Name: Daniel [name] Procedure Date : 01/12/2021 MRN: [id number] Attending MD: Georgian Co ,  Date of Birth: 1961-03-22 CSN: [id number] Age: 60 Admit Type: Inpatient Procedure:                Upper GI endoscopy Indications:              Anemia Providers:                Adline Mango" Marlynn Perking, RN, Laverda Sorenson, Technician, Clearnce Sorrel, CRNA Referring MD:             Debbe Odea, MD Medicines:                Monitored Anesthesia Care Complications:            No immediate complications. Estimated Blood Loss:     Estimated blood loss was minimal. Procedure:                Pre-Anesthesia Assessment:                           - Prior to the procedure, a History and Physical                            was performed, and patient medications and                            allergies were reviewed. The patient is competent.                            The risks and benefits of the procedure and the                            sedation options and risks were discussed with the                            patient. All questions were answered and informed                            consent was obtained. Patient identification and                            proposed procedure were verified by the physician                            in the pre-procedure area. Prophylactic                            Antibiotics: The patient does not require                            prophylactic antibiotics. Prior Anticoagulants: The  patient has taken no previous anticoagulant or                            antiplatelet agents. ASA Grade Assessment: III - A                            patient with severe systemic disease. After                            reviewing the risks and benefits, the patient was                            deemed in satisfactory condition to undergo the                             procedure. The anesthesia plan was to use monitored                            anesthesia care (MAC). Immediately prior to                            administration of medications, the patient was                            re-assessed for adequacy to receive sedatives. The                            heart rate, respiratory rate, oxygen saturations,                            blood pressure, adequacy of pulmonary ventilation,                            and response to care were monitored throughout the                            procedure. The physical status of the patient was                            re-assessed after the procedure.                           After obtaining informed consent, the endoscope was                            passed under direct vision. Throughout the                            procedure, the patient's blood pressure, pulse, and                            oxygen saturations were monitored continuously. The  [id number]-[id number] ([id number]) Olympus endoscope was introduced                            through the mouth, and advanced to the second part                            of duodenum. The upper GI endoscopy was                            accomplished without difficulty. The patient                            tolerated the procedure well. Scope In: Scope Out: Findings:      There were esophageal mucosal changes classified as Barrett's stage       C0-M1 per Prague criteria present at the gastroesophageal junction. The       maximum longitudinal extent of these mucosal changes was 1 cm in length.      A medium amount of food (residue) was found in the gastric body.      One oozing cratered gastric ulcer with oozing hemorrhage (Forrest Class       Ib) was found in the gastric antrum. Area was successfully injected with       2 mL of a 1:10,000 solution of epinephrine for hemostasis. For       hemostasis, two hemostatic clips were successfully  placed. There was no       bleeding at the end of the procedure.      Diffuse nodular mucosa was found in the duodenal bulb. Impression:               - Esophageal mucosal changes classified as                            Barrett's stage C0-M1 per Prague criteria.                           - A medium amount of food (residue) in the stomach.                           - Oozing gastric ulcer with oozing hemorrhage                            (Forrest Class Ib). Injected. Clips were placed.                           - Nodular mucosa in the duodenal bulb.                           - No specimens collected. Recommendation:           - Return patient to hospital ward for ongoing care.                           - Use a proton pump inhibitor PO BID for 3 months,  then PPI QD.                           - Use sucralfate suspension 1 gram PO QID for 4                            weeks.                           - Check serum H pylori antibody. Treat if positive                           - Please stop NSAID use (especially ibuprofen)                           - Repeat EGD in 2-3 months to assess nodular mucosa                            in the duodenum and Barrett's esophagus                           - Follow up with Dr. Havery Moros as an outpatient                           - The findings and recommendations were discussed                            with the patient. Procedure Code(s):        --- Professional ---                           [phone], Esophagogastroduodenoscopy, flexible,                            transoral; with control of bleeding, any method Diagnosis Code(s):        --- Professional ---                           K22.70, Barrett's esophagus without dysplasia                           K25.4, Chronic or unspecified gastric ulcer with                            hemorrhage                           K31.89, Other diseases of stomach and duodenum                            D64.9, Anemia, unspecified CPT copyright 2019 American Medical Association. All rights reserved. The codes documented in this report are preliminary and upon coder review may  be revised to meet current compliance requirements. Sonny Masters "Christia Reading,  01/12/2021 8:11:33 AM Number of Addenda: 0

## 2021-01-12 NOTE — Transfer of Care (Signed)
Immediate Anesthesia Transfer of Care Note  Patient: Daniel [name]  Procedure(s) Performed: ESOPHAGOGASTRODUODENOSCOPY (EGD) WITH PROPOFOL HEMOSTASIS CLIP PLACEMENT SCLEROTHERAPY  Patient Location: PACU  Anesthesia Type:MAC  Level of Consciousness: drowsy  Airway & Oxygen Therapy: Patient Spontanous Breathing  Post-op Assessment: Report given to RN and Post -op Vital signs reviewed and stable  Post vital signs: Reviewed and stable  Last Vitals:  Vitals Value Taken Time  BP 133/50 01/12/21 0804  Temp    Pulse 68 01/12/21 0804  Resp 17 01/12/21 0804  SpO2 100 % 01/12/21 0804  Vitals shown include unvalidated device data.  Last Pain:  Vitals:   01/12/21 0705  TempSrc: Temporal  PainSc: 0-No pain      Patients Stated Pain Goal: 0 (65/79/03 8333)  Complications: No notable events documented.

## 2021-01-12 NOTE — Interval H&P Note (Signed)
History and Physical Interval Note:  01/12/2021 7:36 AM  Daniel [name]  has presented today for surgery, with the diagnosis of Anemia, duodenal ulcer.  The various methods of treatment have been discussed with the patient and family. After consideration of risks, benefits and other options for treatment, the patient has consented to  Procedure(s): ESOPHAGOGASTRODUODENOSCOPY (EGD) WITH PROPOFOL (N/A) as a surgical intervention.  The patient's history has been reviewed, patient examined, no change in status, stable for surgery.  I have reviewed the patient's chart and labs.  Questions were answered to the patient's satisfaction.     Sharyn Creamer

## 2021-01-12 NOTE — Progress Notes (Signed)
Ok to move HD treatment to 01/13/21 per Dr. Jonnie Finner. Orders modified.

## 2021-01-12 NOTE — Progress Notes (Signed)
[facility] Progress Note  Subjective: seen in room, no c/o  Vitals:   01/12/21 0906 01/12/21 1237 01/12/21 1619 01/12/21 1954  BP: (!) 140/49 (!) 139/53 (!) 163/88 (!) 147/81  Pulse: 86 84 89 89  Resp: 15 15 16 16   Temp: 98.7 F (37.1 C) 98 F (36.7 C) 98.2 F (36.8 C) 98.2 F (36.8 C)  TempSrc: Oral Oral Oral Oral  SpO2: 98% 97%  96%  Weight:      Height:        Exam: General: adult male in bed in NAD HEENT: NCAT Eyes: EOMI scler anicteric Neck: supple trachea midline Heart: S1S2 no rub Lungs: clear to auscultation; normal work of breathing at rest on room air  Abdomen: soft/nt/obese habitus/nd Extremities: trace edema no cyanosis or clubbing Skin: lesions on arms  Neuro: alert and oriented x3 provides hx and follows commands Access: LUE AVF bruit and thrill      OP HD:  AF TTS    4h  124 kg    2K/2 Ca   450/500   L AVF  Heparin none  - Na thiosulfate tiw 25 gm  - mircera 225 q2, last 9/06  - hect 3 ug  - venofer 50 weekly    Assessment/Plan: ESRD  - HD per TTS schedule; HD today but may get bumped to tomorrow d/t staffing / high census issues GI bleed - GI consulted, on PPI Anemia chronic renal failure and GI losses - PRBC's for goal of 7-8 HTN - optimize volume with HD  Metabolic bone disease  - outpatient orders include hectorol as above; continue home binders when taking PO Calciphylaxis - Continue sodium thiosulfate    Rob Brent Noto 01/12/2021, 8:22 PM   Recent Labs  Lab 01/11/21 0458 01/11/21 0802 01/12/21 0056  K 4.7  --  4.2  BUN 26*  --  38*  CREATININE 7.29*  --  8.93*  CALCIUM 8.5*  --  9.0  HGB 8.3* 8.5* 8.1*   Inpatient medications:  calcium acetate  1,334 mg Oral TID AC & HS   Chlorhexidine Gluconate Cloth  6 each Topical Daily   cloNIDine  0.1 mg Oral BID   gabapentin  100 mg Oral QHS   insulin aspart  0-6 Units Subcutaneous TID WC   mupirocin ointment  1 application Nasal BID   nicotine  21 mg Transdermal Daily    pantoprazole  40 mg Oral BID   sodium chloride flush  3 mL Intravenous Q12H   sucralfate  1 g Oral TID WC & HS    sodium chloride     sodium chloride     sodium thiosulfate infusion for calciphylaxis     sodium chloride, hydrOXYzine, sodium chloride flush

## 2021-01-13 ENCOUNTER — Encounter (HOSPITAL_COMMUNITY): Payer: Self-pay | Admitting: Internal Medicine

## 2021-01-13 DIAGNOSIS — R7401 Elevation of levels of liver transaminase levels: Secondary | ICD-10-CM

## 2021-01-13 DIAGNOSIS — F1020 Alcohol dependence, uncomplicated: Secondary | ICD-10-CM

## 2021-01-13 DIAGNOSIS — K254 Chronic or unspecified gastric ulcer with hemorrhage: Secondary | ICD-10-CM

## 2021-01-13 DIAGNOSIS — K250 Acute gastric ulcer with hemorrhage: Secondary | ICD-10-CM

## 2021-01-13 DIAGNOSIS — D509 Iron deficiency anemia, unspecified: Secondary | ICD-10-CM

## 2021-01-13 LAB — CBC
HCT: 25.6 % — ABNORMAL LOW (ref 39.0–52.0)
Hemoglobin: 8.2 g/dL — ABNORMAL LOW (ref 13.0–17.0)
MCH: 35.7 pg — ABNORMAL HIGH (ref 26.0–34.0)
MCHC: 32 g/dL (ref 30.0–36.0)
MCV: 111.3 fL — ABNORMAL HIGH (ref 80.0–100.0)
Platelets: 107 10*3/uL — ABNORMAL LOW (ref 150–400)
RBC: 2.3 MIL/uL — ABNORMAL LOW (ref 4.22–5.81)
RDW: 22.2 % — ABNORMAL HIGH (ref 11.5–15.5)
WBC: 5.4 10*3/uL (ref 4.0–10.5)
nRBC: 1.1 % — ABNORMAL HIGH (ref 0.0–0.2)

## 2021-01-13 LAB — GLUCOSE, CAPILLARY
Glucose-Capillary: 133 mg/dL — ABNORMAL HIGH (ref 70–99)
Glucose-Capillary: 181 mg/dL — ABNORMAL HIGH (ref 70–99)

## 2021-01-13 MED ORDER — SUCRALFATE 1 GM/10ML PO SUSP: 1.0000 g | Freq: Three times a day (TID) | ORAL | 0 refills | Status: DC

## 2021-01-13 NOTE — Discharge Summary (Signed)
Physician Discharge Summary  Daniel [name] DQQ:[id number] DOB: 1960-12-01 DOA: 01/10/2021  PCP: [name], NP  Admit date: 01/10/2021 Discharge date: 01/13/2021  Admitted From: home  Disposition:  hom   Recommendations for Outpatient Follow-up:  Continue to encourage abstinence from alcohol   Home Health:  none  Discharge Condition:  stable   CODE STATUS:  full code   Diet recommendation:  renal/ carb mod Consultations: Nephrology GI  Procedures/Studies: EGD   Discharge Diagnoses:  Principal Problem:   Symptomatic anemia Active Problems:   Gastric ulcer with hemorrhage   Essential hypertension   DM2 (diabetes mellitus, type 2) ([place])   Iron deficiency anemia   ESRD on hemodialysis ([place])   Transaminitis   Alcohol dependence ([place])     Brief Summary: Daniel [name] a 60 y.o. male with medical history significant of  ESRD (Tues, Thurs, Saturday), hypertension, T2DM, multifactorial anemia (with recent bleeding duodenal ulcer 07/2020), penile calciphylaxis, alcohol abuse in remission, cocaine abuse in remission and nicotine dependence who presents to ED from dialysis for "AMS" during and after dialysis per triage notes. He was noted to have a BP of 88/56 when EMS arrive and he was given a small NS bolus. He was alert and oriented by slow to respond. The patient states he completed a full dialysis.    In ED Hb 7.7. FOBT +.   Hospital Course:  Principal Problem:   Symptomatic anemia due to GI bleeding - the patient admits to black stools -On 08/01/2020 the patient underwent a small bowel endoscopy which revealed APCs in the duodenum and jejunum. -He underwent an EGD and enteroscopy 08/10/20 which showed a 9 mm cratered duodenal ulcer with bleeding that was treated 3 hemostatic clips.  He also was noted to have gastritis. -ferritin was 13 in 2020, 47 on March 06/2019 followed by 313 on March 28/21 - 9/17 EGD > Oozing gastric ulcer with oozing hemorrhage                             ([place]rest Class Ib). Injected. Clips were placed.                           - Nodular mucosa in the duodenal bulb.                           - No specimens collected. - resume diet 9/17- no bleeding noted - recommendation per GI> cont PPI BID x 3 months, check H pylori serum antibody, add Sucralfate, repeat EGD in 2-3 months - of note, he does drink multiple beers a day and I have asked him to abstain to allow ulcer healing and to prevent future ulcers- he is further advised to avoid NSAIDs   Active Problems:    Transaminitis  History of chronic HCV - AST 123, ALT 52-not a new finding - likely related to drinking  Alcohol dependence - drinks multiple beers on a daily basis - no signs of withdrawal noted during the hospital stay   ESRD on hemodialysis-dialysis Tuesday Thursday Saturday -He had a full dialysis treatment on Thursday - have notified nephrology of his admission and he is being dialyzed today prior to dc     Essential hypertension -cont amlodipine & clonidine      DM2 (diabetes mellitus, type 2), controlled -Does not appear to be on any medications for this  at home Hemoglobin A1C Labs (Brief)          Component Value Date/Time    HGBA1C 5.4 09/26/2020 0930    HGBA1C 6.4 (H) 03/26/2020 1022      Morbid Obesity Body mass index is 36.96 kg/m.   Discharge Exam: Vitals:   01/13/21 0341 01/13/21 0804  BP: 137/68 (!) 150/91  Pulse: 81 86  Resp: (!) 23 16  Temp: 98.2 F (36.8 C) 97.9 F (36.6 C)  SpO2: 93% 97%   Vitals:   01/12/21 1954 01/12/21 2325 01/13/21 0341 01/13/21 0804  BP: (!) 147/81 134/79 137/68 (!) 150/91  Pulse: 89 84 81 86  Resp: 16 18 (!) 23 16  Temp: 98.2 F (36.8 C) 98.1 F (36.7 C) 98.2 F (36.8 C) 97.9 F (36.6 C)  TempSrc: Oral Oral Oral Oral  SpO2: 96% 98% 93% 97%  Weight:      Height:        General: Pt is alert, awake, not in acute distress Cardiovascular: RRR, S1/S2 +, no rubs, no gallops Respiratory: CTA  bilaterally, no wheezing, no rhonchi Abdominal: Soft, NT, ND, bowel sounds + Extremities: no edema, no cyanosis- fistula noted in LUE with good bruit and thrill   Discharge Instructions  Discharge Instructions     Diet general   Complete by: As directed    Renal and diabetic diet   Increase activity slowly   Complete by: As directed       Allergies as of 01/13/2021       Reactions   Dilaudid [hydromorphone Hcl] Itching, Other (See Comments)   Pt reports itchiness after IM injection         Medication List     TAKE these medications    amLODipine 10 MG tablet Commonly known as: NORVASC Take 10 mg by mouth at bedtime.   augmented betamethasone dipropionate 0.05 % cream Commonly known as: DIPROLENE-AF Apply 1 application topically 2 (two) times daily as needed.   bacitracin ointment Apply 1 application topically 2 (two) times daily.   blood glucose meter kit and supplies Kit Dispense based on patient and insurance preference. Use up to four times daily as directed. (FOR ICD-9 250.00, 250.01).   Calcium Acetate 667 MG Tabs Take 1,334 mg by mouth 4 (four) times daily.   cetirizine 10 MG tablet Commonly known as: ZyrTEC Allergy Take 1 tablet (10 mg total) by mouth daily as needed for allergies.   cloNIDine 0.2 MG tablet Commonly known as: CATAPRES Take  1 tablet twice daily . What changed: Another medication with the same name was removed. Continue taking this medication, and follow the directions you see here.   doxepin 10 MG capsule Commonly known as: SINEQUAN Take 10 mg by mouth at bedtime.   gabapentin 100 MG capsule Commonly known as: NEURONTIN Take 1 capsule (100 mg total) by mouth at bedtime.   hydrOXYzine 25 MG tablet Commonly known as: ATARAX/VISTARIL TAKE 1 TABLET BY MOUTH EVERY 8 HOURS AS NEEDED FOR ANXIETY OR  ITCHING   lidocaine-prilocaine cream Commonly known as: EMLA APPLY SMALL AMOUNT TO ACCESS SITE (AVF) 1 TO 2 HOURS BEFORE DIALYSIS.  COVER WITH OCCLUSIVE DRESSING (SARAN WRAP)   magic mouthwash (nystatin, lidocaine, diphenhydrAMINE, alum & mag hydroxide) suspension Swish and spit 5 mLs 4 (four) times daily as needed for mouth pain.   nitroGLYCERIN 0.2 mg/hr patch Commonly known as: NITRODUR - Dosed in mg/24 hr Use 1/4 patch daily to the affected area   nystatin 100000 UNIT/ML  suspension Commonly known as: MYCOSTATIN Take by mouth.   oxyCODONE-acetaminophen 5-325 MG tablet Commonly known as: PERCOCET/ROXICET Take 1 tablet by mouth every 8 (eight) hours as needed for severe pain.   pantoprazole 40 MG tablet Commonly known as: PROTONIX Take 1 tablet by mouth twice daily   sucralfate 1 GM/10ML suspension Commonly known as: CARAFATE Take 10 mLs (1 g total) by mouth 4 (four) times daily -  with meals and at bedtime.   triamcinolone cream 0.1 % Commonly known as: KENALOG Apply 1 application topically 2 (two) times daily.        Allergies  Allergen Reactions   Dilaudid [Hydromorphone Hcl] Itching and Other (See Comments)    Pt reports itchiness after IM injection       DG Chest 2 View  Result Date: 01/06/2021 CLINICAL DATA:  60 year old male with shortness of breath and weakness for 3 days. EXAM: CHEST - 2 VIEW COMPARISON:  January 05, 2021. FINDINGS: Image rotated slightly to the RIGHT. Accounting for this cardiomediastinal contours and hilar structures are unchanged with cardiac enlargement and central pulmonary vascular congestion. Mild interstitial prominence is similar to the prior study. Lung volumes are mildly diminished. No sign of effusion. No lobar consolidation. No pneumothorax. On limited assessment there is no acute skeletal process. IMPRESSION: Cardiomegaly with central pulmonary vascular congestion and mild interstitial prominence similar to the prior study. No signs of effusion or lobar consolidation. Electronically Signed   By: Zetta Bills M.D.   On: 01/06/2021 19:29   DG Chest 2  View  Result Date: 01/05/2021 CLINICAL DATA:  Shortness of breath.  Fall 2 days ago. EXAM: CHEST - 2 VIEW COMPARISON:  7-22 FINDINGS: Midline trachea. Mild cardiomegaly. Atherosclerosis in the transverse aorta. No pleural effusion or pneumothorax. Lung volumes are low, accentuating the pulmonary interstitium. There is concurrent mild pulmonary interstitial thickening. No lobar consolidation. Left axillary vascular stent. IMPRESSION: Cardiomegaly with mild pulmonary interstitial thickening. This could be related to smoking/chronic bronchitis or pulmonary venous congestion. No overt congestive failure. Aortic Atherosclerosis (ICD10-I70.0). Electronically Signed   By: Abigail Miyamoto M.D.   On: 01/05/2021 06:55   CT HEAD WO CONTRAST (5MM)  Result Date: 01/10/2021 CLINICAL DATA:  Altered mental status. EXAM: CT HEAD WITHOUT CONTRAST TECHNIQUE: Contiguous axial images were obtained from the base of the skull through the vertex without intravenous contrast. COMPARISON:  January 05, 2021. FINDINGS: Brain: No evidence of acute infarction, hemorrhage, hydrocephalus, extra-axial collection or mass lesion/mass effect. Vascular: No hyperdense vessel or unexpected calcification. Skull: Normal. Negative for fracture or focal lesion. Sinuses/Orbits: No acute finding. Other: None. IMPRESSION: No acute intracranial abnormality seen. Electronically Signed   By: Marijo Conception M.D.   On: 01/10/2021 15:17   CT HEAD WO CONTRAST (5MM)  Result Date: 01/05/2021 CLINICAL DATA:  Head trauma, recent forgetfulness. EXAM: CT HEAD WITHOUT CONTRAST TECHNIQUE: Contiguous axial images were obtained from the base of the skull through the vertex without intravenous contrast. COMPARISON:  October 27, 2020 FINDINGS: Brain: No evidence of acute infarction, hemorrhage, hydrocephalus, extra-axial collection or mass lesion/mass effect. There is chronic diffuse atrophy. Small old lacunar infarctions are identified in bilateral basal ganglia and left  thalamus unchanged. Vascular: No hyperdense vessel or unexpected calcification. Skull: Normal. Negative for fracture or focal lesion. Sinuses/Orbits: No acute finding. Other: None. IMPRESSION: 1. No focal acute intracranial abnormality identified. 2. Chronic diffuse atrophy. 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 Electronically Signed   By: Abelardo Diesel  M.D.   On: 01/05/2021 12:56   CT Cervical Spine Wo Contrast  Result Date: 01/07/2021 CLINICAL DATA:  60 year old male with generalized weakness and recurrent falls. Bilateral hand pain, tingling, weakness. EXAM: CT CERVICAL SPINE WITHOUT CONTRAST TECHNIQUE: Multidetector CT imaging of the cervical spine was performed without intravenous contrast. Multiplanar CT image reconstructions were also generated. COMPARISON:  Head CT 01/05/2021 and earlier. FINDINGS: Alignment: Straightening with subtle reversal of cervical lordosis. Cervicothoracic junction alignment is within normal limits. Bilateral posterior element alignment is within normal limits. Skull base and vertebrae: Visualized skull base is intact. No atlanto-occipital dissociation. C1 and C2 are intact and aligned. No acute osseous abnormality identified. Soft tissues and spinal canal: No prevertebral fluid or swelling. No visible canal hematoma. Advanced calcified atherosclerosis in the neck. Partially retropharyngeal course of the left carotid. Disc levels: Intermittent calcified ligament flavum hypertrophy in the neck, most pronounced at C6-C7 (series 4, image 63). Advanced degeneration at the anterior C1-C2 articulation with small chronic ossific fragments at the tip of the clivus. C4-C5 and C6-C7 disc bulging with endplate spurring. Mild spinal stenosis suspected at the latter. Upper chest: Visible upper thoracic levels appear intact. Calcified aortic atherosclerosis. Tortuous proximal great vessels with calcified plaque. Mild mediastinal lipomatosis.  Negative upper lungs. Other: Bulky calcified distal vertebral artery atherosclerosis, otherwise negative visible noncontrast posterior fossa. Visible tympanic cavities and mastoids are clear. IMPRESSION: 1. No acute traumatic injury identified in the cervical spine. 2. Intermittent cervical spine degeneration, with mild spinal stenosis suspected at C4-C5 and C6-C7. 3. Generalized advanced calcified atherosclerosis suggesting End stage renal disease. Aortic Atherosclerosis (ICD10-I70.0). Electronically Signed   By: Genevie Ann M.D.   On: 01/07/2021 10:26   CT Knee Left Wo Contrast  Result Date: 01/05/2021 CLINICAL DATA:  Left knee pain.  Recent falls. EXAM: CT OF THE LEFT KNEE WITHOUT CONTRAST TECHNIQUE: Multidetector CT imaging of the left knee was performed according to the standard protocol. Multiplanar CT image reconstructions were also generated. COMPARISON:  Left knee x-rays from same day. FINDINGS: Bones/Joint/Cartilage No fracture or dislocation. Joint spaces are relatively preserved. Bone infarct in the proximal tibia. No joint effusion. Osteopenia. Ligaments Ligaments are suboptimally evaluated by CT. Muscles and Tendons Grossly intact. Soft tissue No fluid collection or hematoma.  No soft tissue mass. IMPRESSION: 1. No acute osseous abnormality. 2. Bone infarct in the proximal tibia. Electronically Signed   By: Titus Dubin M.D.   On: 01/05/2021 12:41   CT Knee Right Wo Contrast  Result Date: 01/05/2021 CLINICAL DATA:  Right knee pain.  Recent falls. EXAM: CT OF THE RIGHT KNEE WITHOUT CONTRAST TECHNIQUE: Multidetector CT imaging of the right knee was performed according to the standard protocol. Multiplanar CT image reconstructions were also generated. COMPARISON:  Right knee x-rays from same day. FINDINGS: Bones/Joint/Cartilage No fracture or dislocation. Joint spaces are relatively preserved. Chronic bone infarcts in the distal femoral metaphysis and lateral femoral condyle. No joint effusion.  Osteopenia. Ligaments Ligaments are suboptimally evaluated by CT. Muscles and Tendons Grossly intact. Soft tissue No fluid collection or hematoma.  No soft tissue mass. IMPRESSION: 1. No acute osseous abnormality. 2. 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 Electronically Signed   By: Titus Dubin M.D.   On: 01/05/2021 12:45   DG Knee Complete 4 Views Left  Result Date: 01/05/2021 CLINICAL DATA:  Left greater than right knee pain.  Fall 2 days ago. EXAM: LEFT KNEE - COMPLETE 4+ VIEW COMPARISON:  None. FINDINGS: Advanced vascular calcifications. Enchondroma versus infarct  within the proximal tibia. No acute fracture or dislocation. Mild patellofemoral and medial compartment joint space narrowing and subchondral sclerosis. No joint effusion. IMPRESSION: No acute osseous abnormality. Enchondroma versus infarct in the proximal tibia. Electronically Signed   By: Abigail Miyamoto M.D.   On: 01/05/2021 06:58   DG Knee Complete 4 Views Right  Result Date: 01/05/2021 CLINICAL DATA:  60 year old male status post fall 2 days ago with continued pain. EXAM: RIGHT KNEE - COMPLETE 4+ VIEW COMPARISON:  None. FINDINGS: Severe calcified peripheral vascular disease. Knee joint spaces and alignment are maintained. Patella intact. 3-4 cm medullary space globular, curvilinear sclerotic lesion. This has a narrow zone of transition and appears to be benign. Bone infarct is possible. But no superimposed acute osseous abnormality, fracture or dislocation identified. No joint effusion is evident. IMPRESSION: 1. No acute fracture or dislocation identified about the right knee. A 3-4 cm sclerotic lesion in the medullary space of the distal femur has a benign appearance, might be a chronic bone infarct. 2. Severe calcified peripheral vascular disease. Electronically Signed   By: Genevie Ann M.D.   On: 01/05/2021 06:59     The results of significant diagnostics from this hospitalization (including imaging, microbiology, ancillary and  laboratory) are listed below for reference.     Microbiology: Recent Results (from the past 240 hour(s))  SARS CORONAVIRUS 2 (TAT 6-24 HRS) Nasopharyngeal Nasopharyngeal Swab     Status: None   Collection Time: 01/10/21  6:58 PM   Specimen: Nasopharyngeal Swab  Result Value Ref Range Status   SARS Coronavirus 2 NEGATIVE NEGATIVE Final    Comment: (NOTE) SARS-CoV-2 target nucleic acids are NOT DETECTED.  The SARS-CoV-2 RNA is generally detectable in upper and lower respiratory specimens during the acute phase of infection. Negative results do not preclude SARS-CoV-2 infection, do not rule out co-infections with other pathogens, and should not be used as the sole basis for treatment or other patient management decisions. Negative results must be combined with clinical observations, patient history, and epidemiological information. The expected result is Negative.  Fact Sheet for Patients: SugarRoll.be  Fact Sheet for Healthcare Providers: https://www.woods-mathews.com/  This test is not yet approved or cleared by the [place] FDA and  has been authorized for detection and/or diagnosis of SARS-CoV-2 by FDA under an Emergency Use Authorization (EUA). This EUA will remain  in effect (meaning this test can be used) for the duration of the COVID-19 declaration under Se ction 564(b)(1) of the Act, 21 U.S.C. section 360bbb-3(b)(1), unless the authorization is terminated or revoked sooner.  Performed at [facility], [place] [address] [place], [place] [address]   Surgical PCR screen     Status: Abnormal   Collection Time: 01/11/21  9:48 PM   Specimen: Nasal Mucosa; Nasal Swab  Result Value Ref Range Status   MRSA, PCR NEGATIVE NEGATIVE Final   Staphylococcus aureus POSITIVE (A) NEGATIVE Final    Comment: (NOTE) The Xpert SA Assay (FDA approved for NASAL specimens in patients 99 years of age and older), is one component of a  comprehensive surveillance program. It is not intended to diagnose infection nor to guide or monitor treatment. Performed at [facility], [place] [address] [place], [place] [address]      Labs: BNP (last 3 results) Recent Labs    09/09/20 1116 01/05/21 0505 01/06/21 1804  BNP 516.1* 87.9 275.1*   Basic Metabolic Panel: Recent Labs  Lab 01/06/21 1804 01/10/21 1306 01/11/21 0458 01/12/21 0056  NA 139 139 139 139  K 5.0 3.5 4.7 4.2  CL 98 95* 90* 94*  CO2 _0 GLUCOSE 186* 92 96 158*  BUN 70* 20 26* 38*  CREATININE 13.36* 5.59* 7.29* 8.93*  CALCIUM 8.6* 7.9* 8.5* 9.0   Liver Function Tests: Recent Labs  Lab 01/06/21 1804 01/10/21 1306 01/11/21 0458  AST 102* 126* 123*  ALT 45* 57* 52*  ALKPHOS 68 82 75  BILITOT 0.8 0.7 1.1  PROT 7.2 7.7 7.3  ALBUMIN 2.5* 2.6* 2.4*   No results for input(s): LIPASE, AMYLASE in the last 168 hours. Recent Labs  Lab 01/07/21 1006 01/10/21 2118  AMMONIA 56* 34   CBC: Recent Labs  Lab 01/06/21 1804 01/10/21 1306 01/10/21 2118 01/10/21 2333 01/11/21 0458 01/11/21 0802 01/12/21 0056 01/13/21 0056  WBC 6.6 4.7  --   --  5.3  --  4.8 5.4  NEUTROABS 3.5  --   --   --   --   --   --   --   HGB 7.1* 7.7*   < > 7.9* 8.3* 8.5* 8.1* 8.2*  HCT 22.7* 24.3*   < > 25.0* 25.9* 26.6* 25.3* 25.6*  MCV 119.5* 116.8*  --   --  112.1*  --  112.4* 111.3*  PLT 152 124*  --   --  111*  --  113* 107*   < > = values in this interval not displayed.   Cardiac Enzymes: No results for input(s): CKTOTAL, CKMB, CKMBINDEX, TROPONINI in the last 168 hours. BNP: Invalid input(s): POCBNP CBG: Recent Labs  Lab 01/12/21 0857 01/12/21 1237 01/12/21 1629 01/12/21 2027 01/13/21 0808  GLUCAP 123* 188* 118* 187* 133*   D-Dimer No results for input(s): DDIMER in the last 72 hours. Hgb A1c No results for input(s): HGBA1C in the last 72 hours. Lipid Profile No results for input(s): CHOL, HDL, LDLCALC, TRIG, CHOLHDL, LDLDIRECT in the  last 72 hours. Thyroid function studies No results for input(s): TSH, T4TOTAL, T3FREE, THYROIDAB in the last 72 hours.  Invalid input(s): FREET3 Anemia work up Recent Labs    01/12/21 0056  VITAMINB12 492  FOLATE 10.3  FERRITIN 138  TIBC 386  IRON 83  RETICCTPCT 2.7   Urinalysis    Component Value Date/Time   COLORURINE YELLOW 07/31/2019 2128   APPEARANCEUR CLEAR 07/31/2019 2128   LABSPEC 1.009 07/31/2019 2128   PHURINE 5.0 07/31/2019 2128   GLUCOSEU NEGATIVE 07/31/2019 2128   HGBUR SMALL (A) 07/31/2019 2128   BILIRUBINUR NEGATIVE 07/31/2019 2128   KETONESUR NEGATIVE 07/31/2019 2128   PROTEINUR 100 (A) 07/31/2019 2128   UROBILINOGEN 0.2 12/31/2009 0746   NITRITE NEGATIVE 07/31/2019 2128   LEUKOCYTESUR NEGATIVE 07/31/2019 2128   Sepsis Labs Invalid input(s): PROCALCITONIN,  WBC,  LACTICIDVEN Microbiology Recent Results (from the past 240 hour(s))  SARS CORONAVIRUS 2 (TAT 6-24 HRS) Nasopharyngeal Nasopharyngeal Swab     Status: None   Collection Time: 01/10/21  6:58 PM   Specimen: Nasopharyngeal Swab  Result Value Ref Range Status   SARS Coronavirus 2 NEGATIVE NEGATIVE Final    Comment: (NOTE) SARS-CoV-2 target nucleic acids are NOT DETECTED.  The SARS-CoV-2 RNA is generally detectable in upper and lower respiratory specimens during the acute phase of infection. Negative results do not preclude SARS-CoV-2 infection, do not rule out co-infections with other pathogens, and should not be used as the sole basis for treatment or other patient management decisions. Negative results must be combined with clinical observations, patient history,  and epidemiological information. The expected result is Negative.  Fact Sheet for Patients: SugarRoll.be  Fact Sheet for Healthcare Providers: https://www.woods-mathews.com/  This test is not yet approved or cleared by the [place] FDA and  has been authorized for detection and/or  diagnosis of SARS-CoV-2 by FDA under an Emergency Use Authorization (EUA). This EUA will remain  in effect (meaning this test can be used) for the duration of the COVID-19 declaration under Se ction 564(b)(1) of the Act, 21 U.S.C. section 360bbb-3(b)(1), unless the authorization is terminated or revoked sooner.  Performed at [facility], [place] [address], [place], [place] [address]   Surgical PCR screen     Status: Abnormal   Collection Time: 01/11/21  9:48 PM   Specimen: Nasal Mucosa; Nasal Swab  Result Value Ref Range Status   MRSA, PCR NEGATIVE NEGATIVE Final   Staphylococcus aureus POSITIVE (A) NEGATIVE Final    Comment: (NOTE) The Xpert SA Assay (FDA approved for NASAL specimens in patients 24 years of age and older), is one component of a comprehensive surveillance program. It is not intended to diagnose infection nor to guide or monitor treatment. Performed at [facility], [place] [address], [place], [place] [address]      Time coordinating discharge in minutes: 65  SIGNED:   Debbe Odea, MD  [facility] 01/13/2021, 9:51 AM

## 2021-01-13 NOTE — Progress Notes (Signed)
[facility] Progress Note  Subjective: Seen in HD unit. Tolerating UF, so far. No complaints. Reports he is going home today.   Vitals:   01/12/21 1954 01/12/21 2325 01/13/21 0341 01/13/21 0804  BP: (!) 147/81 134/79 137/68 (!) 150/91  Pulse: 89 84 81 86  Resp: 16 18 (!) 23 16  Temp: 98.2 F (36.8 C) 98.1 F (36.7 C) 98.2 F (36.8 C) 97.9 F (36.6 C)  TempSrc: Oral Oral Oral Oral  SpO2: 96% 98% 93% 97%  Weight:      Height:        Exam: General: adult male in bed in NAD Heart: S1S2 no rub Lungs: clear to auscultation; normal work of breathing at rest on room air  Abdomen: soft/nt/obese habitus/nd Extremities: trace edema no cyanosis or clubbing Skin: lesions on arms  Neuro: alert and oriented x3 provides hx and follows commands Access: LUE AVF in use on HD.      OP HD:  [facility] TTS    4h  124 kg    2K/2 Ca   450/500   L AVF  Heparin none  - Na thiosulfate tiw 25 gm  - mircera 225 q2, last 9/06  - hect 3 ug  - venofer 50 weekly    Assessment/Plan: ESRD  - HD per TTS schedule; HD off schedule today d/t staffing / high census issues GI bleed - GI consulted. S/p small bowel endoscopy with APCs, clipped. On PPI. Carafate started--limit duration of use in ESRD patient Anemia chronic renal failure and GI losses - Hgb 8s s/p PRBCs on 9/15.  HTN - optimize volume with HD  Metabolic bone disease  - outpatient orders include hectorol as above; continue home binders when taking PO Calciphylaxis - Continue sodium thiosulfate    Lynnda Child PA-C [facility] 01/13/2021,10:44 AM    Recent Labs  Lab 01/11/21 0458 01/11/21 0802 01/12/21 0056 01/13/21 0056  K 4.7  --  4.2  --   BUN 26*  --  38*  --   CREATININE 7.29*  --  8.93*  --   CALCIUM 8.5*  --  9.0  --   HGB 8.3*   < > 8.1* 8.2*   < > = values in this interval not displayed.    Inpatient medications:  calcium acetate  1,334 mg Oral TID AC & HS   Chlorhexidine Gluconate Cloth   6 each Topical Daily   cloNIDine  0.1 mg Oral BID   gabapentin  100 mg Oral QHS   insulin aspart  0-6 Units Subcutaneous TID WC   mupirocin ointment  1 application Nasal BID   nicotine  21 mg Transdermal Daily   pantoprazole  40 mg Oral BID   sodium chloride flush  3 mL Intravenous Q12H   sucralfate  1 g Oral TID WC & HS    sodium chloride     sodium chloride     sodium thiosulfate infusion for calciphylaxis     sodium chloride, hydrOXYzine, sodium chloride flush

## 2021-01-13 NOTE — Progress Notes (Signed)
Patient stated he is ready to go home, stated he will catch the bus since his phone ran out of battery and unbale to remember his number. Declined any assistance offered; stated "I am okay and I really just want to go home".  Patient discharged via wheelchair; all belongings sent with patient.

## 2021-01-13 NOTE — Plan of Care (Signed)

## 2021-01-13 NOTE — Progress Notes (Signed)
Patient eager to go home, discharge instructions given, explained to patient, verbalized understanding. Stated he will call a friend to pick him up.   PIV discontinued.

## 2021-01-13 NOTE — Progress Notes (Signed)
Return to the unit from HD.

## 2021-01-14 ENCOUNTER — Telehealth: Payer: Self-pay | Admitting: Nephrology

## 2021-01-14 ENCOUNTER — Telehealth: Payer: Self-pay

## 2021-01-14 NOTE — Telephone Encounter (Signed)
Transition of Care Contact from [place]  Date of Discharge: 01/10/21 Date of Contact: 01/13/21 Method of contact: phone - attempted  Attempted to contact patient to discuss transition of care from inpatient admission.  Patient did not answer the phone.  Will attempt to call them again and if unable to reach will follow up at dialysis.  Jen Mow, PA-C [facility] Pager: [phone]

## 2021-01-14 NOTE — Telephone Encounter (Signed)
Transition Care Management Unsuccessful Follow-up Telephone Call  Date of discharge and from where:  [facility]  Attempts:  1st Attempt  Reason for unsuccessful TCM follow-up call:  Left voice message unable to reach pt at this time.  Pt have scheduled HFU appt on 02/13/2021 with PCP

## 2021-01-14 NOTE — Telephone Encounter (Signed)
Lm on vm for patient to return call to schedule hospital follow up with Dr. Havery Moros in the next few weeks.

## 2021-01-14 NOTE — Telephone Encounter (Signed)
-----   Message from Yetta Flock, MD sent at 01/12/2021  4:01 PM EDT ----- Dorinda Hill, thanks for the note. Yes would be happy to coordinate follow up. He has no showed prior visits with me in the office, I will try to coordinate office follow up first for him if he is willing and then EGD.  Coila Wardell can you help coordinate an office follow up with me or APP in the next few weeks post hospitalization? Thanks  Richardson Landry  ----- Message ----- From: Sharyn Creamer, MD Sent: 01/12/2021   8:16 AM EDT To: Willia Craze, NP, Yetta Flock, MD  [facility] Richardson Landry,  Would you be able to arrange for an EGD for your patient Mr. Hanken? He is still using ibuprofen and had another bleeding ulcer that I treated today. I also recommended repeat EGD in 2-3 months because he has Barrett's esophagus and Ulice Dash on his last EGD wanted his duodenal nodular mucosa to be further assessed previously (I thought it probably was duodenitis but didn't want to change the previously plan dramatically). I didn't biopsy in the setting of acute bleeding.  Thanks, Lyndee Leo

## 2021-01-15 ENCOUNTER — Telehealth: Payer: Self-pay

## 2021-01-15 ENCOUNTER — Other Ambulatory Visit: Payer: Self-pay | Admitting: Internal Medicine

## 2021-01-15 DIAGNOSIS — A048 Other specified bacterial intestinal infections: Secondary | ICD-10-CM

## 2021-01-15 LAB — H. PYLORI ANTIBODY, IGG: H Pylori IgG: 1.17 Index Value — ABNORMAL HIGH (ref 0.00–0.79)

## 2021-01-15 MED ORDER — METRONIDAZOLE 250 MG PO TABS
250.0000 mg | ORAL_TABLET | Freq: Four times a day (QID) | ORAL | 0 refills | Status: AC
Start: 2021-01-15 — End: 2021-01-29

## 2021-01-15 MED ORDER — PANTOPRAZOLE SODIUM 40 MG PO TBEC: 40.0000 mg | DELAYED_RELEASE_TABLET | Freq: Two times a day (BID) | ORAL | 2 refills | Status: DC

## 2021-01-15 MED ORDER — BISMUTH SUBSALICYLATE 262 MG PO CHEW: 524.0000 mg | CHEWABLE_TABLET | Freq: Three times a day (TID) | ORAL | 0 refills | Status: DC

## 2021-01-15 MED ORDER — TETRACYCLINE HCL 500 MG PO CAPS: 500.0000 mg | ORAL_CAPSULE | Freq: Four times a day (QID) | ORAL | 0 refills | Status: DC

## 2021-01-15 NOTE — Progress Notes (Signed)
Patient had a positive H pylori antibody. I called the patient to notify him of the results and sent bismuth quadruple therapy for treatment to his pharmacy.  - Tetracycline 500 mg QID x 14 days - Flagyl 250 mg QID x 14 days - Bismuth subsalicylate 462 mg QID x 14 days - PPI BID x 14 days

## 2021-01-15 NOTE — Progress Notes (Signed)
Hi Ammie, I already called the patient about this result and sent in treatment. I don't see that he has GI follow up yet. Could you arrange for a 2 month clinic follow up with me? Thanks!

## 2021-01-15 NOTE — Telephone Encounter (Signed)
Transition Care Management Follow-up Telephone Call Date of discharge and from where: 01/13/2021, [facility] How have you been since you were released from the hospital? He said he is feeling pretty good.  Any questions or concerns? No  Items Reviewed: Did the pt receive and understand the discharge instructions provided? Yes  Medications obtained and verified? Yes  - he said he has all medications and did not have any questions about the med regime  Other? No  Any new allergies since your discharge? No  Do you have support at home?  He stays with his cousin.   Home Care and Equipment/Supplies: Were home health services ordered? no If so, what is the name of the agency? N/a  Has the agency set up a time to come to the patient's home? not applicable Were any new equipment or medical supplies ordered?  No What is the name of the medical supply agency? N/a Were you able to get the supplies/equipment? not applicable Do you have any questions related to the use of the equipment or supplies? No  Attends Dialysis - T/T/S at [facility]  Functional Questionnaire: (I = Independent and D = Dependent) ADLs: independent   Follow up appointments reviewed:  PCP Hospital f/u appt confirmed? Yes  Scheduled to see [name],  NP on 01/28/2021 @ 1010. [facility] f/u appt confirmed? Yes  Scheduled to see Sports Medicine- 01/23/2021; Neurology- 03/01/2021.  Are transportation arrangements needed? No  If their condition worsens, is the pt aware to call PCP or go to the Emergency Dept.? Yes Was the patient provided with contact information for the PCP's office or ED? Yes Was to pt encouraged to call back with questions or concerns? Yes

## 2021-01-17 NOTE — Telephone Encounter (Signed)
Pt returned call and has been scheduled to see Ellouise Newer, PA-C on Friday, 02/15/21 at 11 am.

## 2021-01-22 ENCOUNTER — Telehealth: Payer: Self-pay | Admitting: Internal Medicine

## 2021-01-22 DIAGNOSIS — A048 Other specified bacterial intestinal infections: Secondary | ICD-10-CM

## 2021-01-22 MED ORDER — TETRACYCLINE HCL 500 MG PO CAPS: 500.0000 mg | ORAL_CAPSULE | Freq: Every day | ORAL | 0 refills | Status: AC

## 2021-01-22 MED ORDER — BISMUTH SUBSALICYLATE 262 MG PO CHEW: 524.0000 mg | CHEWABLE_TABLET | Freq: Two times a day (BID) | ORAL | 0 refills | Status: AC

## 2021-01-22 NOTE — Telephone Encounter (Signed)
Received a notification from Daniel Fairly NP with nephrology to dose adjust his tetracycline for HD. Thus I have re-adjusted his prescription for tetracycline from 500 mg QID to QD and bismuth from BID to QD. Will also stop his carafate as per nephrology this should not be taken for longer than 2 weeks. Called the patient to notify him of these changes. He is not sure if he has started his treatment for H pylori. He is currently at HD and would like another call in a few hours when he is at home to clarify the dosages of the medications. I did call his [place] to confirm that he did pick up his tetracycline. They were not sure if he had picked up his bismuth since this is an OTC medication.

## 2021-01-23 ENCOUNTER — Encounter: Payer: Self-pay | Admitting: Family Medicine

## 2021-01-23 ENCOUNTER — Ambulatory Visit (INDEPENDENT_AMBULATORY_CARE_PROVIDER_SITE_OTHER): Payer: Medicare Other | Admitting: Family Medicine

## 2021-01-23 DIAGNOSIS — M25511 Pain in right shoulder: Secondary | ICD-10-CM | POA: Diagnosis not present

## 2021-01-23 DIAGNOSIS — G8929 Other chronic pain: Secondary | ICD-10-CM

## 2021-01-23 DIAGNOSIS — M12811 Other specific arthropathies, not elsewhere classified, right shoulder: Secondary | ICD-10-CM

## 2021-01-23 DIAGNOSIS — M75101 Unspecified rotator cuff tear or rupture of right shoulder, not specified as traumatic: Secondary | ICD-10-CM | POA: Diagnosis not present

## 2021-01-23 MED ORDER — METHYLPREDNISOLONE ACETATE 40 MG/ML IJ SUSP
40.0000 mg | Freq: Once | INTRAMUSCULAR | Status: AC
  Administered 2021-01-23: 40 mg via INTRA_ARTICULAR

## 2021-01-23 NOTE — Telephone Encounter (Signed)
Called pt to make him aware of documented changes below. Verbalized acceptance and understanding of these changes. Confirmed he did not pick up Pepto Bismol OTC but states he will do that today. Confirmed he understands the change in the frequency of this medication. Voiced no further concerns nor questions at this time.

## 2021-01-23 NOTE — Assessment & Plan Note (Signed)
Discussed repeat shoulder injection to help with pain. Patient requesting injection today for right shoulder. Patient would like to proceed with evaluation for supraspinatus tear repair. Agreeable to starting with MRI. Will order this today.   After informed written consent timeout was performed, patient was seated in chair. Right shoulder was prepped with alcohol swab and utilizing posterior approach with ultrasound guidance, patient's right glenohumeral space was injected with 3:1 lidocaine: depomedrol. Patient tolerated the procedure well without immediate complications.

## 2021-01-23 NOTE — Patient Instructions (Signed)
We repeated your injection today in your shoulder. We will go ahead with an MRI given you've not improved with more than 6 weeks of conservative treatment including home exercise program. Voltaren gel topically up to 4 times a day. I will call you with results and next steps.

## 2021-01-23 NOTE — Progress Notes (Signed)
   PCP: [name], NP  Subjective:   HPI: Patient is a 60 y.o. male here for follow-up for right shoulder pain.  Patient presents with continued right shoulder pain that he says has not improved since he was last evaluated.  He states that he has difficulty being able to raise his shoulder overhead.  He denies any symptoms of paresthesia.  He states that he often has jerks in both of his hands and sometimes will drop objects.  He states that he previously had a shoulder joint injection that helped his pain tremendously though at last visit he reported it provided minimal relief.  Last injection was over 2 months ago.  Has been doing home exercises without benefit as well.      Objective:  Physical Exam:  Gen: awake, alert, NAD, comfortable in exam room Pulm: breathing unlabored  Right Shoulder: Inspection reveals no obvious deformity, atrophy, or asymmetry. No bruising. No swelling Palpation has TTP over AC joint, no TTP over bicipital groove. Limited ROM in flexion, abduction to 45 degrees.  Full internal/external rotation NV intact distally  Special Tests:  - Impingement: Neg Hawkins, neers, empty can sign. - Supraspinatus: + empty can with markedly decreased strength 2/5 - Infraspinatus/Teres Minor: 4/5 strength with ER. + pain with resisted ER - Subscapularis:  4/5 strength with IR, +pain with resisted IR - Biceps tendon: Yergason's negative - AC Joint: pain with  cross arm - Negative apprehension test   Assessment & Plan:    Rotator cuff tear arthropathy of right shoulder Discussed repeat shoulder injection to help with pain. Patient requesting injection today for right shoulder. We will also go ahead with MRI to assess health of his rotator cuff - concern supraspinatus tear may be irreparable.  Voltaren gel.  Continue home exercises.  After informed written consent timeout was performed, patient was seated in chair in exam room. Right shoulder was prepped with  alcohol swab and utilizing lateral approach with ultrasound guidance, patient's right subacromial space was injected with 3:1 lidocaine: depomedrol. Patient tolerated the procedure well without immediate complications.   Orders Placed This Encounter  Procedures   MR SHOULDER RIGHT WO CONTRAST    Standing Status:   Future    Standing Expiration Date:   01/23/2022    Order Specific Question:   What is the patient's sedation requirement?    Answer:   No Sedation    Order Specific Question:   Does the patient have a pacemaker or implanted devices?    Answer:   No    Order Specific Question:   Preferred imaging location?    Answer:   [facility] (table limit-550lbs)    Meds ordered this encounter  Medications   methylPREDNISolone acetate (DEPO-MEDROL) injection 40 mg    Eulis Foster, MD [place], PGY-3 01/23/2021 4:20 PM

## 2021-01-28 ENCOUNTER — Ambulatory Visit (INDEPENDENT_AMBULATORY_CARE_PROVIDER_SITE_OTHER): Payer: Medicare Other | Admitting: Primary Care

## 2021-02-04 ENCOUNTER — Other Ambulatory Visit: Payer: Medicare Other

## 2021-02-07 ENCOUNTER — Emergency Department (HOSPITAL_COMMUNITY): Payer: Medicare Other

## 2021-02-07 ENCOUNTER — Encounter (HOSPITAL_COMMUNITY): Payer: Self-pay | Admitting: Pharmacy Technician

## 2021-02-07 ENCOUNTER — Inpatient Hospital Stay (HOSPITAL_COMMUNITY)
Admission: EM | Admit: 2021-02-07 | Discharge: 2021-02-09 | DRG: 377 | Disposition: A | Discharge status: Home / Self Care | Payer: Medicare Other | Attending: Internal Medicine | Admitting: Internal Medicine

## 2021-02-07 ENCOUNTER — Ambulatory Visit (HOSPITAL_COMMUNITY): Payer: Medicare Other

## 2021-02-07 DIAGNOSIS — K8020 Calculus of gallbladder without cholecystitis without obstruction: Secondary | ICD-10-CM | POA: Diagnosis present

## 2021-02-07 DIAGNOSIS — Z6841 Body Mass Index (BMI) 40.0 and over, adult: Secondary | ICD-10-CM | POA: Diagnosis not present

## 2021-02-07 DIAGNOSIS — Z79899 Other long term (current) drug therapy: Secondary | ICD-10-CM | POA: Diagnosis not present

## 2021-02-07 DIAGNOSIS — K3189 Other diseases of stomach and duodenum: Secondary | ICD-10-CM | POA: Diagnosis not present

## 2021-02-07 DIAGNOSIS — I120 Hypertensive chronic kidney disease with stage 5 chronic kidney disease or end stage renal disease: Secondary | ICD-10-CM | POA: Diagnosis present

## 2021-02-07 DIAGNOSIS — N2581 Secondary hyperparathyroidism of renal origin: Secondary | ICD-10-CM | POA: Diagnosis present

## 2021-02-07 DIAGNOSIS — K922 Gastrointestinal hemorrhage, unspecified: Secondary | ICD-10-CM | POA: Diagnosis present

## 2021-02-07 DIAGNOSIS — F1010 Alcohol abuse, uncomplicated: Secondary | ICD-10-CM | POA: Diagnosis present

## 2021-02-07 DIAGNOSIS — I10 Essential (primary) hypertension: Secondary | ICD-10-CM | POA: Diagnosis not present

## 2021-02-07 DIAGNOSIS — D62 Acute posthemorrhagic anemia: Secondary | ICD-10-CM | POA: Diagnosis present

## 2021-02-07 DIAGNOSIS — K219 Gastro-esophageal reflux disease without esophagitis: Secondary | ICD-10-CM | POA: Diagnosis present

## 2021-02-07 DIAGNOSIS — G629 Polyneuropathy, unspecified: Secondary | ICD-10-CM | POA: Diagnosis present

## 2021-02-07 DIAGNOSIS — K2971 Gastritis, unspecified, with bleeding: Secondary | ICD-10-CM | POA: Diagnosis present

## 2021-02-07 DIAGNOSIS — D631 Anemia in chronic kidney disease: Secondary | ICD-10-CM | POA: Diagnosis present

## 2021-02-07 DIAGNOSIS — N186 End stage renal disease: Secondary | ICD-10-CM

## 2021-02-07 DIAGNOSIS — Z20822 Contact with and (suspected) exposure to covid-19: Secondary | ICD-10-CM | POA: Diagnosis present

## 2021-02-07 DIAGNOSIS — K2970 Gastritis, unspecified, without bleeding: Secondary | ICD-10-CM | POA: Diagnosis not present

## 2021-02-07 DIAGNOSIS — E669 Obesity, unspecified: Secondary | ICD-10-CM | POA: Diagnosis present

## 2021-02-07 DIAGNOSIS — K2270 Barrett's esophagus without dysplasia: Secondary | ICD-10-CM | POA: Diagnosis present

## 2021-02-07 DIAGNOSIS — K259 Gastric ulcer, unspecified as acute or chronic, without hemorrhage or perforation: Secondary | ICD-10-CM | POA: Diagnosis present

## 2021-02-07 DIAGNOSIS — F1410 Cocaine abuse, uncomplicated: Secondary | ICD-10-CM | POA: Diagnosis present

## 2021-02-07 DIAGNOSIS — F17210 Nicotine dependence, cigarettes, uncomplicated: Secondary | ICD-10-CM | POA: Diagnosis present

## 2021-02-07 DIAGNOSIS — E1122 Type 2 diabetes mellitus with diabetic chronic kidney disease: Secondary | ICD-10-CM | POA: Diagnosis present

## 2021-02-07 DIAGNOSIS — Z992 Dependence on renal dialysis: Secondary | ICD-10-CM | POA: Diagnosis not present

## 2021-02-07 DIAGNOSIS — B182 Chronic viral hepatitis C: Secondary | ICD-10-CM | POA: Diagnosis present

## 2021-02-07 DIAGNOSIS — Z888 Allergy status to other drugs, medicaments and biological substances status: Secondary | ICD-10-CM | POA: Diagnosis not present

## 2021-02-07 DIAGNOSIS — D649 Anemia, unspecified: Secondary | ICD-10-CM | POA: Diagnosis not present

## 2021-02-07 DIAGNOSIS — A048 Other specified bacterial intestinal infections: Secondary | ICD-10-CM

## 2021-02-07 LAB — CBC WITH DIFFERENTIAL/PLATELET
Abs Immature Granulocytes: 0 10*3/uL (ref 0.00–0.07)
Basophils Absolute: 0 10*3/uL (ref 0.0–0.1)
Basophils Relative: 0 %
Eosinophils Absolute: 0 10*3/uL (ref 0.0–0.5)
Eosinophils Relative: 0 %
HCT: 22.9 % — ABNORMAL LOW (ref 39.0–52.0)
Hemoglobin: 7 g/dL — ABNORMAL LOW (ref 13.0–17.0)
Lymphocytes Relative: 27 %
Lymphs Abs: 1.7 10*3/uL (ref 0.7–4.0)
MCH: 35.9 pg — ABNORMAL HIGH (ref 26.0–34.0)
MCHC: 30.6 g/dL (ref 30.0–36.0)
MCV: 117.4 fL — ABNORMAL HIGH (ref 80.0–100.0)
Monocytes Absolute: 0.5 10*3/uL (ref 0.1–1.0)
Monocytes Relative: 8 %
Neutro Abs: 4 10*3/uL (ref 1.7–7.7)
Neutrophils Relative %: 65 %
Platelets: 140 10*3/uL — ABNORMAL LOW (ref 150–400)
RBC: 1.95 MIL/uL — ABNORMAL LOW (ref 4.22–5.81)
RDW: 18.8 % — ABNORMAL HIGH (ref 11.5–15.5)
WBC: 6.2 10*3/uL (ref 4.0–10.5)
nRBC: 1.3 % — ABNORMAL HIGH (ref 0.0–0.2)
nRBC: 2 /100 WBC — ABNORMAL HIGH

## 2021-02-07 LAB — COMPREHENSIVE METABOLIC PANEL
ALT: 18 U/L (ref 0–44)
AST: 46 U/L — ABNORMAL HIGH (ref 15–41)
Albumin: 2.8 g/dL — ABNORMAL LOW (ref 3.5–5.0)
Alkaline Phosphatase: 95 U/L (ref 38–126)
Anion gap: 13 (ref 5–15)
BUN: 21 mg/dL (ref 8–23)
CO2: 30 mmol/L (ref 22–32)
Calcium: 9.1 mg/dL (ref 8.9–10.3)
Chloride: 94 mmol/L — ABNORMAL LOW (ref 98–111)
Creatinine, Ser: 6.53 mg/dL — ABNORMAL HIGH (ref 0.61–1.24)
GFR, Estimated: 9 mL/min — ABNORMAL LOW (ref >60)
Glucose, Bld: 252 mg/dL — ABNORMAL HIGH (ref 70–99)
Potassium: 3.9 mmol/L (ref 3.5–5.1)
Sodium: 137 mmol/L (ref 135–145)
Total Bilirubin: 0.6 mg/dL (ref 0.3–1.2)
Total Protein: 8.5 g/dL — ABNORMAL HIGH (ref 6.5–8.1)

## 2021-02-07 LAB — CBC
HCT: 24.1 % — ABNORMAL LOW (ref 39.0–52.0)
Hemoglobin: 7.9 g/dL — ABNORMAL LOW (ref 13.0–17.0)
MCH: 36.2 pg — ABNORMAL HIGH (ref 26.0–34.0)
MCHC: 32.8 g/dL (ref 30.0–36.0)
MCV: 110.6 fL — ABNORMAL HIGH (ref 80.0–100.0)
Platelets: 125 10*3/uL — ABNORMAL LOW (ref 150–400)
RBC: 2.18 MIL/uL — ABNORMAL LOW (ref 4.22–5.81)
RDW: 21.6 % — ABNORMAL HIGH (ref 11.5–15.5)
WBC: 5.6 10*3/uL (ref 4.0–10.5)
nRBC: 1.4 % — ABNORMAL HIGH (ref 0.0–0.2)

## 2021-02-07 LAB — TROPONIN I (HIGH SENSITIVITY)
Troponin I (High Sensitivity): 8 ng/L (ref <18)
Troponin I (High Sensitivity): 8 ng/L (ref <18)

## 2021-02-07 LAB — PREPARE RBC (CROSSMATCH)

## 2021-02-07 LAB — LIPASE, BLOOD: Lipase: 113 U/L — ABNORMAL HIGH (ref 11–51)

## 2021-02-07 MED ORDER — ADULT MULTIVITAMIN W/MINERALS CH
1.0000 | ORAL_TABLET | Freq: Every day | ORAL | Status: DC
  Administered 2021-02-07 – 2021-02-09 (×3): 1 via ORAL
  Filled 2021-02-07 (×3): qty 1

## 2021-02-07 MED ORDER — AMLODIPINE BESYLATE 5 MG PO TABS
5.0000 mg | ORAL_TABLET | Freq: Every day | ORAL | Status: DC
  Administered 2021-02-08 – 2021-02-09 (×2): 5 mg via ORAL
  Filled 2021-02-07 (×2): qty 1

## 2021-02-07 MED ORDER — SODIUM THIOSULFATE 250 MG/ML IV SOLN
25.0000 g | INTRAVENOUS | Status: DC
  Administered 2021-02-09: 25 g via INTRAVENOUS
  Filled 2021-02-07: qty 100

## 2021-02-07 MED ORDER — NICOTINE 21 MG/24HR TD PT24
21.0000 mg | MEDICATED_PATCH | Freq: Every day | TRANSDERMAL | Status: DC
  Administered 2021-02-07 – 2021-02-08 (×2): 21 mg via TRANSDERMAL
  Filled 2021-02-07 (×3): qty 1

## 2021-02-07 MED ORDER — MORPHINE SULFATE (PF) 2 MG/ML IV SOLN
1.0000 mg | INTRAVENOUS | Status: DC | PRN
  Administered 2021-02-08: 1 mg via INTRAVENOUS
  Filled 2021-02-07: qty 1

## 2021-02-07 MED ORDER — SODIUM CHLORIDE 0.9% FLUSH: 3.0000 mL | INTRAVENOUS | Status: DC | PRN

## 2021-02-07 MED ORDER — CALCIUM ACETATE (PHOS BINDER) 667 MG PO CAPS
1334.0000 mg | ORAL_CAPSULE | Freq: Three times a day (TID) | ORAL | Status: DC
  Administered 2021-02-07 – 2021-02-09 (×7): 1334 mg via ORAL
  Filled 2021-02-07 (×7): qty 2

## 2021-02-07 MED ORDER — SUCRALFATE 1 G PO TABS
1.0000 g | ORAL_TABLET | Freq: Every day | ORAL | Status: DC
  Administered 2021-02-07 – 2021-02-09 (×3): 1 g via ORAL
  Filled 2021-02-07 (×3): qty 1

## 2021-02-07 MED ORDER — DOXEPIN HCL 10 MG PO CAPS
10.0000 mg | ORAL_CAPSULE | Freq: Every day | ORAL | Status: DC
  Administered 2021-02-07 – 2021-02-08 (×2): 10 mg via ORAL
  Filled 2021-02-07 (×3): qty 1

## 2021-02-07 MED ORDER — ONDANSETRON HCL 4 MG PO TABS: 4.0000 mg | ORAL_TABLET | Freq: Four times a day (QID) | ORAL | Status: DC | PRN

## 2021-02-07 MED ORDER — SODIUM CHLORIDE 0.9 % IV SOLN: 10.0000 mL/h | Freq: Once | INTRAVENOUS | Status: DC

## 2021-02-07 MED ORDER — PANTOPRAZOLE 80MG IVPB - SIMPLE MED
80.0000 mg | Freq: Once | INTRAVENOUS | Status: DC
  Filled 2021-02-07: qty 100

## 2021-02-07 MED ORDER — SODIUM CHLORIDE 0.9% FLUSH
3.0000 mL | Freq: Two times a day (BID) | INTRAVENOUS | Status: DC
  Administered 2021-02-07 – 2021-02-09 (×4): 3 mL via INTRAVENOUS

## 2021-02-07 MED ORDER — LORATADINE 10 MG PO TABS: 10.0000 mg | ORAL_TABLET | Freq: Every day | ORAL | Status: DC | PRN

## 2021-02-07 MED ORDER — LORAZEPAM 1 MG PO TABS: 1.0000 mg | ORAL_TABLET | ORAL | Status: DC | PRN

## 2021-02-07 MED ORDER — CLONIDINE HCL 0.1 MG PO TABS
0.1000 mg | ORAL_TABLET | Freq: Two times a day (BID) | ORAL | Status: DC
  Administered 2021-02-07 – 2021-02-09 (×4): 0.1 mg via ORAL
  Filled 2021-02-07 (×4): qty 1

## 2021-02-07 MED ORDER — LORAZEPAM 2 MG/ML IJ SOLN: 1.0000 mg | INTRAMUSCULAR | Status: DC | PRN

## 2021-02-07 MED ORDER — PANTOPRAZOLE SODIUM 40 MG IV SOLR: 40.0000 mg | Freq: Two times a day (BID) | INTRAVENOUS | Status: DC

## 2021-02-07 MED ORDER — PANTOPRAZOLE INFUSION (NEW) - SIMPLE MED
8.0000 mg/h | INTRAVENOUS | Status: DC
  Administered 2021-02-07 – 2021-02-09 (×4): 8 mg/h via INTRAVENOUS
  Filled 2021-02-07 (×3): qty 80
  Filled 2021-02-07: qty 100
  Filled 2021-02-07: qty 80

## 2021-02-07 MED ORDER — GABAPENTIN 300 MG PO CAPS
300.0000 mg | ORAL_CAPSULE | Freq: Three times a day (TID) | ORAL | Status: DC
  Administered 2021-02-07 – 2021-02-09 (×5): 300 mg via ORAL
  Filled 2021-02-07 (×5): qty 1

## 2021-02-07 MED ORDER — THIAMINE HCL 100 MG/ML IJ SOLN: 100.0000 mg | Freq: Every day | INTRAMUSCULAR | Status: DC

## 2021-02-07 MED ORDER — ONDANSETRON HCL 4 MG/2ML IJ SOLN: 4.0000 mg | Freq: Four times a day (QID) | INTRAMUSCULAR | Status: DC | PRN

## 2021-02-07 MED ORDER — SODIUM CHLORIDE 0.9 % IV SOLN: 250.0000 mL | INTRAVENOUS | Status: DC | PRN

## 2021-02-07 MED ORDER — HYDROXYZINE HCL 25 MG PO TABS: 25.0000 mg | ORAL_TABLET | Freq: Three times a day (TID) | ORAL | Status: DC | PRN

## 2021-02-07 MED ORDER — DOXERCALCIFEROL 4 MCG/2ML IV SOLN
3.0000 ug | INTRAVENOUS | Status: DC
  Administered 2021-02-09: 3 ug via INTRAVENOUS
  Filled 2021-02-07 (×3): qty 2

## 2021-02-07 MED ORDER — FOLIC ACID 1 MG PO TABS
1.0000 mg | ORAL_TABLET | Freq: Every day | ORAL | Status: DC
  Administered 2021-02-07 – 2021-02-09 (×3): 1 mg via ORAL
  Filled 2021-02-07 (×3): qty 1

## 2021-02-07 MED ORDER — THIAMINE HCL 100 MG PO TABS
100.0000 mg | ORAL_TABLET | Freq: Every day | ORAL | Status: DC
  Administered 2021-02-07 – 2021-02-09 (×3): 100 mg via ORAL
  Filled 2021-02-07 (×3): qty 1

## 2021-02-07 MED ORDER — GABAPENTIN 100 MG PO CAPS
100.0000 mg | ORAL_CAPSULE | Freq: Every day | ORAL | Status: DC
  Administered 2021-02-07 – 2021-02-08 (×2): 100 mg via ORAL
  Filled 2021-02-07 (×2): qty 1

## 2021-02-07 NOTE — ED Provider Notes (Addendum)
Emergency Medicine Provider Triage Evaluation Note  Daniel [name] , a 60 y.o. male  was evaluated in triage.  Pt complains of weakness.  Began yesterday.  He has pain to his epigastric region and lower chest.  Does not radiating to back.  He feels weak in his bilateral upper and lower extremities however no numbness.  Went to dialysis today, did 2 sessions and came off due to weakness.  He denies any shortness of breath, headache, focal weakness or numbness.  No known sick contact  Review of Systems  Positive: Epigastric pain, weakness Negative: Numbness, headache  Physical Exam  There were no vitals taken for this visit. Gen:   Awake, no distress   Resp:  Normal effort  MSK:   Moves extremities without difficulty  ABD:  Diffuse tenderness to upper abdomen, lower chest Other:    Medical Decision Making  Medically screening exam initiated at 10:56 AM.  Appropriate orders placed.  Escher C Heatley was informed that the remainder of the evaluation will be completed by another provider, this initial triage assessment does not replace that evaluation, and the importance of remaining in the ED until their evaluation is complete.  Soft BP, systolic 93  Sarah, charge nurse notified patient needs room in back    Wheeler, Silas Flood, PA-C 02/07/21 1100    Tegeler, Gwenyth Allegra, MD 02/07/21 [phone]

## 2021-02-07 NOTE — Consult Note (Signed)
Consultation  Referring Provider:   Dr. Sherry Ruffing Primary Care Physician:  [name], NP Primary Gastroenterologist:   Althia Forts (if he follows in our clinic it will be with Dr. Havery Moros)    Reason for Consultation:   Melena, anemia         HPI:   Daniel [name] is a 60 y.o. male with a past medical history significant for ESRD on HD (Tuesday/Thursday/Saturday), hypertension, diabetes type 2, chronic anemia, PUD with recent bleeding ulcer, hepatitis C, history of cocaine abuse and alcohol abuse, who presented to the hospital today with increasing weakness and report of continued melena.    01/11/2021 consult by our service during patient's last hospitalization for anemia and melena.  At that time that he had history of bleeding duodenal ulcer in late April of this year and had continue taking NSAIDs.  Repeat EGD 01/12/2021 showed esophageal mucosal changes classified as Barrett's, medium amount of food residue in the stomach, oozing gastric ulcer with oozing hemorrhage which was injected with clips placed as well as nodular mucosa in the duodenal bulb.  At that time recommend the patient is a PPI p.o. twice daily for 3 months and Carafate suspension 1 g p.o. 4 times daily for 4 weeks.  An H. pylori antibody was checked and it was recommended he discontinue NSAID use.  Repeat EGD recommended in 2 to 3 months.    01/15/2021 patient was called regarding a positive H. pylori antibody.  He was started on bismuth quadruple therapy with tetracycline, Flagyl, bismuth and PPI.  Dosing was later adjusted given his ESRD.  Tetracycline started 500 mg 4 times daily was changed to daily and Bismuth from twice daily to daily.  His Carafate was also stopped.     Today, the patient presented to the ER and tells me that he has had increasing weakness, worse over the past week, and in fact had a fall 2 days ago.  Tells me that prior to all this he was able to walk fine and has not really sure why his legs  are so "weak".  Does tell me that he has been continuing with black stool at least twice a day ever since discharge at the end of September.  Tells me he was not using Pepto-Bismol or iron supplementation at home.  Does tell me that he finished his therapy for H. pylori as prescribed by our clinic about a week ago or so.  Associated symptoms include some mild epigastric pain somewhat worse after eating, ever since the end of September.    Denies fever, chills, weight loss or symptoms that awaken him from sleep.  GI history: See above for most recent history/admission, also more in chart  Past Medical History:  Diagnosis Date   Anemia    Diabetes mellitus without complication [facility])    patient denies   Dialysis patient [facility])    End stage chronic kidney disease ([place])    Hypertension    ICH (intracerebral hemorrhage) ([place]) 05/20/2017   Shoulder pain, left 06/28/2013    Past Surgical History:  Procedure Laterality Date   A/V FISTULAGRAM N/A 08/15/2020   Procedure: A/V FISTULAGRAM - Left Upper;  Surgeon: Cherre Robins, MD;  Location: [place] CV LAB;  Service: Cardiovascular;  Laterality: N/A;   APPENDECTOMY     AV FISTULA PLACEMENT Left 08/03/2019   Procedure: LEFT ARM ARTERIOVENOUS (AV) CEPHALIC  FISTULA CREATION;  Surgeon: Waynetta Sandy, MD;  Location: [place];  Service:  Vascular;  Laterality: Left;   BIOPSY  06/30/2019   Procedure: BIOPSY;  Surgeon: Ronald Lobo, MD;  Location: [place];  Service: Endoscopy;;   BIOPSY  08/02/2019   Procedure: BIOPSY;  Surgeon: Yetta Flock, MD;  Location: [place];  Service: Gastroenterology;;   COLONOSCOPY  01/23/2012   Procedure: COLONOSCOPY;  Surgeon: Danie Binder, MD;  [facility];  Service: Endoscopy;  Laterality: N/A;  11:10 AM   COLONOSCOPY WITH PROPOFOL N/A 06/30/2019   Procedure: COLONOSCOPY WITH PROPOFOL;  Surgeon: Ronald Lobo, MD;  Location: [place];  Service: Endoscopy;  Laterality: N/A;    ENTEROSCOPY N/A 08/02/2019   Procedure: ENTEROSCOPY;  Surgeon: Yetta Flock, MD;  Location: [facility] ENDOSCOPY;  Service: Gastroenterology;  Laterality: N/A;   ESOPHAGOGASTRODUODENOSCOPY N/A 08/10/2020   Procedure: ESOPHAGOGASTRODUODENOSCOPY (EGD);  Surgeon: Jerene Bears, MD;  Location: [facility]COPY;  Service: Gastroenterology;  Laterality: N/A;   ESOPHAGOGASTRODUODENOSCOPY (EGD) WITH PROPOFOL N/A 06/30/2019   Procedure: ESOPHAGOGASTRODUODENOSCOPY (EGD) WITH PROPOFOL;  Surgeon: Ronald Lobo, MD;  Location: [place];  Service: Endoscopy;  Laterality: N/A;   ESOPHAGOGASTRODUODENOSCOPY (EGD) WITH PROPOFOL N/A 01/12/2021   Procedure: ESOPHAGOGASTRODUODENOSCOPY (EGD) WITH PROPOFOL;  Surgeon: Sharyn Creamer, MD;  Location: [place];  Service: Gastroenterology;  Laterality: N/A;   FISTULA SUPERFICIALIZATION Left 10/17/2019   Procedure: LEFT UPPER EXTREMITY FISTULA REVISION, SIDE BRANCH LIGATION,  AND SUPERFICIALIZATION;  Surgeon: Marty Heck, MD;  Location: [place];  Service: Vascular;  Laterality: Left;   GIVENS CAPSULE STUDY N/A 06/30/2019   Procedure: GIVENS CAPSULE STUDY;  Surgeon: Ronald Lobo, MD;  Location: [place];  Service: Endoscopy;  Laterality: N/A;   HEMOSTASIS CLIP PLACEMENT  08/10/2020   Procedure: HEMOSTASIS CLIP PLACEMENT;  Surgeon: Jerene Bears, MD;  Location: [facility] ENDOSCOPY;  Service: Gastroenterology;;   HEMOSTASIS CLIP PLACEMENT  01/12/2021   Procedure: HEMOSTASIS CLIP PLACEMENT;  Surgeon: Sharyn Creamer, MD;  Location: [facility] ENDOSCOPY;  Service: Gastroenterology;;   HEMOSTASIS CONTROL  08/02/2019   Procedure: HEMOSTASIS CONTROL;  Surgeon: Yetta Flock, MD;  Location: [facility] ENDOSCOPY;  Service: Gastroenterology;;   INCISION AND DRAINAGE ABSCESS N/A 06/29/2016   Procedure: INCISION AND DRAINAGE ABDOMINAL WALL ABSCESS;  Surgeon: Alphonsa Overall, MD;  [facility];  Service: General;  Laterality: N/A;   INSERTION OF DIALYSIS CATHETER Right 08/03/2019   Procedure:  INSERTION OF DIALYSIS CATHETER;  Surgeon: Waynetta Sandy, MD;  Location: [place];  Service: Vascular;  Laterality: Right;   INSERTION OF DIALYSIS CATHETER Right 10/22/2019   Procedure: INSERTION OF 23CM TUNNELED DIALYSIS CATHETER RIGHT INTERNAL JUGULAR;  Surgeon: Angelia Mould, MD;  Location: [place];  Service: Vascular;  Laterality: Right;   INSERTION OF DIALYSIS CATHETER Right 08/12/2020   Procedure: INSERTION OF Right internal Jugular TUNNELED  DIALYSIS CATHETER.;  Surgeon: Waynetta Sandy, MD;  Location: [place];  Service: Vascular;  Laterality: Right;   Left heel surgery     PENILE BIOPSY N/A 03/26/2020   Procedure: PENILE ULCER DEBRIDEMENT;  Surgeon: Remi Haggard, MD;  Location: [facility];  Service: Urology;  Laterality: N/A;  30 MINS   SCLEROTHERAPY  01/12/2021   Procedure: SCLEROTHERAPY;  Surgeon: Sharyn Creamer, MD;  Location: [facility] ENDOSCOPY;  Service: Gastroenterology;;    Family History  Problem Relation Age of Onset   Colon cancer Neg Hx     Social History   Tobacco Use   Smoking status: Every Day    Packs/day: 1.00    Years: 30.00    Pack  years: 30.00    Types: Cigarettes   Smokeless tobacco: Never  Vaping Use   Vaping Use: Never used  Substance Use Topics   Alcohol use: Not Currently    Alcohol/week: 12.0 standard drinks    Types: 12 Cans of beer per week   Drug use: Not Currently    Types: "Crack" cocaine    Comment: last in 2020    Prior to Admission medications   Medication Sig Start Date End Date Taking? Authorizing Provider  amLODipine (NORVASC) 10 MG tablet Take 10 mg by mouth in the morning and at bedtime. 01/01/21  Yes [provider]  Calcium Acetate 667 MG TABS Take 1,334 mg by mouth 4 (four) times daily. 09/11/20  Yes [provider]  cetirizine (ZYRTEC ALLERGY) 10 MG tablet Take 1 tablet (10 mg total) by mouth daily as needed for allergies. 08/22/20  Yes [name], NP  cloNIDine (CATAPRES) 0.2 MG tablet  Take  1 tablet twice daily . Patient taking differently: Take 0.2 mg by mouth 2 (two) times daily. Take  1 tablet twice daily . 10/28/20  Yes Donnamae Jude, MD  doxepin (SINEQUAN) 10 MG capsule Take 10 mg by mouth at bedtime. 01/03/21  Yes [provider]  gabapentin (NEURONTIN) 100 MG capsule Take 1 capsule (100 mg total) by mouth at bedtime. 08/16/20  Yes Florencia Reasons, MD  gabapentin (NEURONTIN) 300 MG capsule Take 300 mg by mouth in the morning, at noon, and at bedtime. 02/04/21  Yes [provider]  hydrOXYzine (ATARAX/VISTARIL) 25 MG tablet TAKE 1 TABLET BY MOUTH EVERY 8 HOURS AS NEEDED FOR ANXIETY OR  ITCHING Patient taking differently: Take 25 mg by mouth in the morning and at bedtime. 11/07/20  Yes [name], NP  lidocaine-prilocaine (EMLA) cream APPLY SMALL AMOUNT TO ACCESS SITE (AVF) 1 TO 2 HOURS BEFORE DIALYSIS. COVER WITH OCCLUSIVE DRESSING (SARAN WRAP) 12/27/20  Yes [provider]  oxyCODONE-acetaminophen (PERCOCET/ROXICET) 5-325 MG tablet Take 1 tablet by mouth every 8 (eight) hours as needed for severe pain. 11/12/20  Yes Jaynee Eagles, PA-C  pantoprazole (PROTONIX) 40 MG tablet Take 1 tablet (40 mg total) by mouth 2 (two) times daily. 01/15/21  Yes Sharyn Creamer, MD  sucralfate (CARAFATE) 1 g tablet Take 1 g by mouth daily. 01/30/21  Yes [provider]  bacitracin ointment Apply 1 application topically 2 (two) times daily. Patient not taking: No sig reported 01/05/21   Regan Lemming, MD  blood glucose meter kit and supplies KIT Dispense based on patient and insurance preference. Use up to four times daily as directed. (FOR ICD-9 250.00, 250.01). 07/01/16   Nita Sells, MD  magic mouthwash (nystatin, lidocaine, diphenhydrAMINE, alum & mag hydroxide) suspension Swish and spit 5 mLs 4 (four) times daily as needed for mouth pain. Patient not taking: No sig reported 12/12/20   Hazel Sams, PA-C  nitroGLYCERIN (NITRODUR - DOSED IN MG/24 HR) 0.2  mg/hr patch Use 1/4 patch daily to the affected area Patient not taking: No sig reported 12/12/20   Hudnall, Sharyn Lull, MD  colchicine 0.6 MG tablet Take 0.5 tablets (0.3 mg total) by mouth 2 (two) times daily. 07/24/20 07/29/20  Noemi Chapel, MD  ferrous sulfate 325 (65 FE) MG tablet SMARTSIG:1 Tablet(s) By Mouth Twice Daily 08/11/19 02/09/20  [provider]  furosemide (LASIX) 40 MG tablet Take 1 tablet (40 mg total) by mouth daily. 07/25/19 02/09/20  Ladona Horns, MD    Current Facility-Administered Medications  Medication Dose Route Frequency Provider Last Rate Last Admin   0.9 %  sodium chloride infusion  10 mL/hr Intravenous Once Lilland, Alana, DO       Current Outpatient Medications  Medication Sig Dispense Refill   amLODipine (NORVASC) 10 MG tablet Take 10 mg by mouth in the morning and at bedtime.     Calcium Acetate 667 MG TABS Take 1,334 mg by mouth 4 (four) times daily.     cetirizine (ZYRTEC ALLERGY) 10 MG tablet Take 1 tablet (10 mg total) by mouth daily as needed for allergies. 90 tablet 1   cloNIDine (CATAPRES) 0.2 MG tablet Take  1 tablet twice daily . (Patient taking differently: Take 0.2 mg by mouth 2 (two) times daily. Take  1 tablet twice daily .) 180 tablet 1   doxepin (SINEQUAN) 10 MG capsule Take 10 mg by mouth at bedtime.     gabapentin (NEURONTIN) 100 MG capsule Take 1 capsule (100 mg total) by mouth at bedtime. 30 capsule 0   gabapentin (NEURONTIN) 300 MG capsule Take 300 mg by mouth in the morning, at noon, and at bedtime.     hydrOXYzine (ATARAX/VISTARIL) 25 MG tablet TAKE 1 TABLET BY MOUTH EVERY 8 HOURS AS NEEDED FOR ANXIETY OR  ITCHING (Patient taking differently: Take 25 mg by mouth in the morning and at bedtime.) 90 tablet 0   lidocaine-prilocaine (EMLA) cream APPLY SMALL AMOUNT TO ACCESS SITE (AVF) 1 TO 2 HOURS BEFORE DIALYSIS. COVER WITH OCCLUSIVE DRESSING (SARAN WRAP)     oxyCODONE-acetaminophen (PERCOCET/ROXICET) 5-325 MG tablet Take 1 tablet by mouth  every 8 (eight) hours as needed for severe pain. 10 tablet 0   pantoprazole (PROTONIX) 40 MG tablet Take 1 tablet (40 mg total) by mouth 2 (two) times daily. 60 tablet 2   sucralfate (CARAFATE) 1 g tablet Take 1 g by mouth daily.     bacitracin ointment Apply 1 application topically 2 (two) times daily. (Patient not taking: No sig reported) 120 g 0   blood glucose meter kit and supplies KIT Dispense based on patient and insurance preference. Use up to four times daily as directed. (FOR ICD-9 250.00, 250.01). 1 each 0   magic mouthwash (nystatin, lidocaine, diphenhydrAMINE, alum & mag hydroxide) suspension Swish and spit 5 mLs 4 (four) times daily as needed for mouth pain. (Patient not taking: No sig reported) 180 mL 0   nitroGLYCERIN (NITRODUR - DOSED IN MG/24 HR) 0.2 mg/hr patch Use 1/4 patch daily to the affected area (Patient not taking: No sig reported) 30 patch 1    Allergies as of 02/07/2021 - Review Complete 02/07/2021  Allergen Reaction Noted   Dilaudid [hydromorphone hcl] Itching and Other (See Comments) 07/29/2020     Review of Systems:    Constitutional: No weight loss, fever or chills Skin: No rash  Cardiovascular: No chest pain  Respiratory: +DOE Gastrointestinal: See HPI and otherwise negative Genitourinary: No dysuria  Neurological: No headache Musculoskeletal: No new muscle or joint pain Hematologic: No bruising Psychiatric: No history of depression or anxiety    Physical Exam:  Vital signs in last 24 hours: Temp:  [97.6 F (36.4 C)] 97.6 F (36.4 C) (10/13 1057) Pulse Rate:  [66-76] 68 (10/13 1350) Resp:  [15-23] 15 (10/13 1355) BP: (91-114)/(54-62) 114/54 (10/13 1351) SpO2:  [93 %-100 %] 98 % (10/13 1350)   General:   Pleasant chronically ill appearing, Overweight AA male appears to be in NAD, Well developed, Well nourished, alert and cooperative Head:  Normocephalic  and atraumatic. Eyes:   PEERL, EOMI. No icterus. Conjunctiva pink. Ears:  Normal auditory  acuity. Neck:  Supple Throat: Oral cavity and pharynx without inflammation, swelling or lesion.  Lungs: Respirations even and unlabored. Lungs clear to auscultation bilaterally.   No wheezes, crackles, or rhonchi.  Heart: Normal S1, S2. No MRG. Regular rate and rhythm. No peripheral edema, cyanosis or pallor.  Abdomen:  Soft, nondistended, nontender. No rebound or guarding. Normal bowel sounds. No appreciable masses or hepatomegaly. Rectal:  Not performed.  Msk:  Symmetrical without gross deformities. Peripheral pulses intact.  Extremities:  Without edema, no deformity or joint abnormality.  Neurologic:  Alert and  oriented x4;  grossly normal neurologically.  Skin:   Dry and intact without significant lesions or rashes. Psychiatric: Demonstrates good judgement and reason without abnormal affect or behaviors.   LAB RESULTS: Recent Labs    02/07/21 1103  WBC 6.2  HGB 7.0*  HCT 22.9*  PLT 140*   BMET Recent Labs    02/07/21 1103  NA 137  K 3.9  CL 94*  CO2 30  GLUCOSE 252*  BUN 21  CREATININE 6.53*  CALCIUM 9.1   Hepatic Function Latest Ref Rng & Units 02/07/2021 01/11/2021 01/10/2021  Total Protein 6.5 - 8.1 g/dL 8.5(H) 7.3 7.7  Albumin 3.5 - 5.0 g/dL 2.8(L) 2.4(L) 2.6(L)  AST 15 - 41 U/L 46(H) 123(H) 126(H)  ALT 0 - 44 U/L 18 52(H) 57(H)  Alk Phosphatase 38 - 126 U/L 95 75 82  Total Bilirubin 0.3 - 1.2 mg/dL 0.6 1.1 0.7  Bilirubin, Direct 0.0 - 0.2 mg/dL - - -    STUDIES: CT ABDOMEN PELVIS WO CONTRAST  Result Date: 02/07/2021 CLINICAL DATA:  Abdominal distension EXAM: CT ABDOMEN AND PELVIS WITHOUT CONTRAST TECHNIQUE: Multidetector CT imaging of the abdomen and pelvis was performed following the standard protocol without IV contrast. COMPARISON:  CT abdomen and pelvis dated October 27, 2020 FINDINGS: Lower chest: Normal heart size. No pericardial effusion. Coronary artery calcifications. Mild bibasilar atelectasis. No acute abnormality. Hepatobiliary: No focal liver  abnormality. Cholelithiasis with possible mild gallbladder wall thickening. No biliary ductal dilation. Pancreas: Unremarkable. No pancreatic ductal dilatation or surrounding inflammatory changes. Spleen: Normal in size without focal abnormality. Adrenals/Urinary Tract: Adrenal glands are unremarkable. Atrophic bilateral kidneys no hydronephrosis. Punctate nonobstructing stones of the left kidney. Bladder is unremarkable. Stomach/Bowel: Stomach is within normal limits. Appendix is surgically absent. No evidence of bowel wall thickening, distention, or inflammatory changes. Vascular/Lymphatic: Aortic atherosclerosis. No enlarged abdominal or pelvic lymph nodes. Reproductive: Prostate is unremarkable. Other: No abdominal wall hernia or abnormality. No abdominopelvic ascites. Musculoskeletal: No acute or significant osseous findings. IMPRESSION: Cholelithiasis with possible mild gallbladder wall thickening. Correlate for symptoms of right upper quadrant pain and consider further evaluation with gallbladder ultrasound. Otherwise, no acute findings in the abdomen or pelvis. Aortic aneurysm NOS (ICD10-I71.9). Electronically Signed   By: Yetta Glassman M.D.   On: 02/07/2021 13:38   DG Chest 2 View  Result Date: 02/07/2021 CLINICAL DATA:  Weakness, epigastric pain EXAM: CHEST - 2 VIEW COMPARISON:  01/06/2021 FINDINGS: Unchanged cardiac and mediastinal contours, with redemonstrated cardiac enlargement and aortic atherosclerosis. Low lung volumes. Mild pulmonary interstitial thickening. No focal pulmonary opacity. No pleural effusion or pneumothorax. No acute osseous abnormality. IMPRESSION: Cardiomegaly and mild pulmonary interstitial thickening, which could be related to chronic bronchitis or pulmonary venous congestion. Electronically Signed   By: Merilyn Baba M.D.   On: 02/07/2021 12:02   US Abdomen Limited RUQ (  LIVER/GB)  Result Date: 02/07/2021 CLINICAL DATA:  Cholelithiasis. Question cholecystitis.  Abdominal distension. EXAM: ULTRASOUND ABDOMEN LIMITED RIGHT UPPER QUADRANT COMPARISON:  CT abdomen pelvis 02/07/2021 FINDINGS: Gallbladder: Calcified gallstones noted within the gallbladder lumen. Associated gallbladder wall thickening. No definite pericholecystic fluid; however, limited evaluation deep ended tray shin of the ultrasound waves. No sonographic Murphy sign noted by sonographer. Common bile duct: Diameter: 4 mm. Liver: No focal lesion identified. Slightly heterogeneous hepatic parenchyma which may be due to ultrasound technique. Otherwise normal in parenchymal echogenicity. Portal vein is patent on color Doppler imaging with normal direction of blood flow towards the liver. Other: None. IMPRESSION: 1. Cholelithiasis with associated nonspecific gallbladder wall thickening. No pericholecystic fluid or sonographic Percell Miller sign reported. Gallbladder wall thickening can be seen in the setting of chronic liver disease. Correlate with clinical exam for acute cholecystitis. 2. Limited ultrasound to penetration of sonographic waves. Electronically Signed   By: Iven Finn M.D.   On: 02/07/2021 15:33      Impression / Plan:   Impression: 1.  Anemia with melena: Hemoglobin 9 on 9/18-> 7.0 today--> 1 unit PRBCs ordered, reports continued black stools twice daily ever since time of discharge at the end of September with increasing weakness and a fall 2 days ago, denies continued NSAID use, did finish therapy for H. pylori; most likely upper GI bleed from recently clipped gastric ulcer 2.  Cholelithiasis with gallbladder wall thickening: Does not report any specific gallbladder symptoms today and LFTs are actually improved 3.  ESRD on HD: Tuesday/Thursday/Saturday 4.  Chronic HCV 5.  Element of chronically elevated LFTs: these actually seem improved this admission and have been thought related to alcohol use in the past  Plan: 1.  We will plan for repeat EGD tomorrow.  Did discuss risks, benefits,  limitations and alternatives with the patient and he agrees to proceed. 2.  Patient will be on clear liquid diet today (given that he had a medium amount of food residue at time of last EGD) and n.p.o. at midnight. 3.  Started Pantoprazole 40 mg IV twice daily 4.  Continue to monitor hemoglobin and transfuse as needed less than 7.  5.  Ordered recheck of CBC and CMP in the morning.  Thank you for your kind consultation, we will continue to follow.  Lavone Nian Hyde Sires  02/07/2021, 3:42 PM

## 2021-02-07 NOTE — TOC Initial Note (Signed)
Transition of Care Practice [facility]) - Initial/Assessment Note    Patient Details  Name: Daniel [name] MRN: [id number] Date of Birth: 12/15/1960  Transition of Care [facility]) CM/SW Contact:    Verdell Carmine, RN Phone Number: 02/07/2021, 5:44 PM  Clinical Narrative:                  60 year old male history of ESRD on dialysis, presented to the ED with abdominal pain. US revealed cholelithiasis with thickening GB as well as Murphy's sign. He also states he some melena, HBG 7. Impression is UGI bleed  likely due to PUD. History of ETOH although he states he has cut back from previous. EDD 10/16 CM will follow for needs, Recommendations, and transitions.   Expected Discharge Plan: Home/Self Care Barriers to Discharge: Continued Medical Work up   Patient Goals and CMS Choice        Expected Discharge Plan and Services Expected Discharge Plan: Home/Self Care       Living arrangements for the past 2 months: Single Family Home                                      Prior Living Arrangements/Services Living arrangements for the past 2 months: Single Family Home   Patient language and need for interpreter reviewed:: Yes        Need for Family Participation in Patient Care: Yes (Comment) Care giver support system in place?: Yes (comment)   Criminal Activity/Legal Involvement Pertinent to Current Situation/Hospitalization: No - Comment as needed  Activities of Daily Living      Permission Sought/Granted                  Emotional Assessment       Orientation: : Oriented to Place, Oriented to  Time, Oriented to Situation, Oriented to Self Alcohol / Substance Use: Not Applicable Psych Involvement: No (comment)  Admission diagnosis:  UGI bleed [K92.2] Cholelithiasis [K80.20] Symptomatic anemia [D64.9] Patient Active Problem List   Diagnosis Date Noted   UGI bleed 02/07/2021   Rotator cuff tear arthropathy of right shoulder 01/23/2021   Alcohol dependence  ([place]) 01/13/2021   Gastric ulcer with hemorrhage 01/13/2021   Generalized weakness    Transaminitis 01/10/2021   Pain in joint of right shoulder 11/14/2020   Right hip pain 11/14/2020   Seizure-like activity ([place]) 10/27/2020   History of alcohol abuse 10/27/2020   History of cocaine abuse ([place]) 10/27/2020   Nicotine dependence, cigarettes, uncomplicated 27/06/5007   Fever 10/27/2020   Acute GI bleeding 08/10/2020   Gastrointestinal hemorrhage with melena    Duodenal ulcer with hemorrhage    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   ESRD on hemodialysis ([place])    AVM (arteriovenous malformation) of small bowel, acquired with hemorrhage    Symptomatic anemia 07/31/2019   Acute on chronic anemia 07/23/2019   Chronic kidney disease, stage V ([place])    Chronic hepatitis C without hepatic coma ([place]) 07/11/2019   Iron deficiency anemia 03/30/2019   CKD (chronic kidney disease), stage V ([place]) 03/30/2019   Alcohol use disorder 03/30/2019   B12 deficiency 03/30/2019   Orthostatic hypotension 01/22/2019   Anemia    Essential hypertension 06/29/2016   DM2 (diabetes mellitus, type 2) ([place]) 06/29/2016   PCP:  [name], NP Pharmacy:   [place] [place]  [address] [place] [address] Phone: [phone] Fax: [phone]     Social Determinants of Health (SDOH) Interventions    Readmission Risk Interventions Readmission Risk Prevention Plan 07/07/2019  Transportation Screening Complete  PCP or Specialist Appt within 3-5 Days Complete  HRI or [place] Complete  Social Work Consult for [place] Planning/Counseling Complete  Palliative Care Screening Not Applicable  Medication Review Press photographer) Complete  Some recent data might be hidden

## 2021-02-07 NOTE — Plan of Care (Signed)
Pt will report zero bloody stools over next 72 hrs

## 2021-02-07 NOTE — ED Provider Notes (Signed)
Daniel [name] Provider Note   CSN: [id number] Arrival date & time: 02/07/21  1037   History Chief Complaint  Patient presents with   Weakness    Daniel [name] is a 60 y.o. male presenting with weakness and epigastric pain. PMH significant for HTN, T2DM, ESRD on HD, Anemia.  Patient reports that he has had on and off weakness and abdominal pain for the last 2 weeks.  Today during dialysis his pain and weakness got worse and he was only able to tolerate 2 hours of his session.  In the last 2 weeks, he has been getting dizzy and has fallen on his knees at least once every day due to weakness and has had intermittent diplopia.  He has had leg swelling, which he reports is not abnormal for him. Patient was hospitalized last month from 9/15-9/18 for symptomatic anemia from a GI bleed. At that time he had duodenal ulcer that was treated with hemostatic clips.   Patient has a history of ESRD on HD and still makes urine, and he urinates 1/4 cup 2x/day.  He notes that he lives with his cousin, but has to do everything independently.  Last alcoholic beverage was a 93TD beer yesterday. Patient reports his eyes have been yellow for the last 4 months.    Past Medical History:  Diagnosis Date   Anemia    Diabetes mellitus without complication [facility])    patient denies   Dialysis patient [facility])    End stage chronic kidney disease ([place])    Hypertension    ICH (intracerebral hemorrhage) ([place]) 05/20/2017   Shoulder pain, left 06/28/2013    Patient Active Problem List   Diagnosis Date Noted   Rotator cuff tear arthropathy of right shoulder 01/23/2021   Alcohol dependence ([place]) 01/13/2021   Gastric ulcer with hemorrhage 01/13/2021   Generalized weakness    Transaminitis 01/10/2021   Pain in joint of right shoulder 11/14/2020   Right hip pain 11/14/2020   Seizure-like activity ([place]) 10/27/2020   History of alcohol abuse 10/27/2020   History of cocaine  abuse ([place]) 10/27/2020   Nicotine dependence, cigarettes, uncomplicated 42/87/6811   Fever 10/27/2020   Acute GI bleeding 08/10/2020   Gastrointestinal hemorrhage with melena    Duodenal ulcer with hemorrhage    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   ESRD on hemodialysis ([place])    AVM (arteriovenous malformation) of small bowel, acquired with hemorrhage    Symptomatic anemia 07/31/2019   Acute on chronic anemia 07/23/2019   Chronic kidney disease, stage V ([place])    Chronic hepatitis C without hepatic coma ([place]) 07/11/2019   Iron deficiency anemia 03/30/2019   CKD (chronic kidney disease), stage V ([place]) 03/30/2019   Alcohol use disorder 03/30/2019   B12 deficiency 03/30/2019   Orthostatic hypotension 01/22/2019   Anemia    Essential hypertension 06/29/2016   DM2 (diabetes mellitus, type 2) ([place]) 06/29/2016    Past Surgical History:  Procedure Laterality Date   A/V FISTULAGRAM N/A 08/15/2020   Procedure: A/V FISTULAGRAM - Left Upper;  Surgeon: Cherre Robins, MD;  Location: [place] [facility];  Service: Cardiovascular;  Laterality: N/A;   APPENDECTOMY     AV FISTULA PLACEMENT Left 08/03/2019   Procedure: LEFT ARM ARTERIOVENOUS (AV) CEPHALIC  FISTULA CREATION;  Surgeon: Waynetta Sandy, MD;  Location: [place];  Service: Vascular;  Laterality: Left;   BIOPSY  06/30/2019   Procedure: BIOPSY;  Surgeon: Ronald Lobo, MD;  [facility];  Service: Endoscopy;;   BIOPSY  08/02/2019   Procedure: BIOPSY;  Surgeon: Yetta Flock, MD;  Location: [place];  Service: Gastroenterology;;   COLONOSCOPY  01/23/2012   Procedure: COLONOSCOPY;  Surgeon: Danie Binder, MD;  [facility];  Service: Endoscopy;  Laterality: N/A;  11:10 AM   COLONOSCOPY WITH PROPOFOL N/A 06/30/2019   Procedure: COLONOSCOPY WITH PROPOFOL;  Surgeon: Ronald Lobo, MD;  Location: [place];  Service: Endoscopy;  Laterality: N/A;   ENTEROSCOPY N/A 08/02/2019   Procedure:  ENTEROSCOPY;  Surgeon: Yetta Flock, MD;  Location: [facility] ENDOSCOPY;  Service: Gastroenterology;  Laterality: N/A;   ESOPHAGOGASTRODUODENOSCOPY N/A 08/10/2020   Procedure: ESOPHAGOGASTRODUODENOSCOPY (EGD);  Surgeon: Jerene Bears, MD;  Location: [facility] ENDOSCOPY;  Service: Gastroenterology;  Laterality: N/A;   ESOPHAGOGASTRODUODENOSCOPY (EGD) WITH PROPOFOL N/A 06/30/2019   Procedure: ESOPHAGOGASTRODUODENOSCOPY (EGD) WITH PROPOFOL;  Surgeon: Ronald Lobo, MD;  Location: [place];  Service: Endoscopy;  Laterality: N/A;   ESOPHAGOGASTRODUODENOSCOPY (EGD) WITH PROPOFOL N/A 01/12/2021   Procedure: ESOPHAGOGASTRODUODENOSCOPY (EGD) WITH PROPOFOL;  Surgeon: Sharyn Creamer, MD;  Location: [place];  Service: Gastroenterology;  Laterality: N/A;   FISTULA SUPERFICIALIZATION Left 10/17/2019   Procedure: LEFT UPPER EXTREMITY FISTULA REVISION, SIDE BRANCH LIGATION,  AND SUPERFICIALIZATION;  Surgeon: Marty Heck, MD;  Location: [place];  Service: Vascular;  Laterality: Left;   GIVENS CAPSULE STUDY N/A 06/30/2019   Procedure: GIVENS CAPSULE STUDY;  Surgeon: Ronald Lobo, MD;  Location: [place];  Service: Endoscopy;  Laterality: N/A;   HEMOSTASIS CLIP PLACEMENT  08/10/2020   Procedure: HEMOSTASIS CLIP PLACEMENT;  Surgeon: Jerene Bears, MD;  Location: [facility] ENDOS[facility];  Service: Gastroenterology;;   HEMOSTASIS CLIP PLACEMENT  01/12/2021   Procedure: HEMOSTASIS CLIP PLACEMENT;  Surgeon: Sharyn Creamer, MD;  Location: [facility] ENDOSCOPY;  Service: Gastroenterology;;   HEMOSTASIS CONTROL  08/02/2019   Procedure: HEMOSTASIS CONTROL;  Surgeon: Yetta Flock, MD;  Location: [facility] ENDOSCOPY;  Service: Gastroenterology;;   INCISION AND DRAINAGE ABSCESS N/A 06/29/2016   Procedure: INCISION AND DRAINAGE ABDOMINAL WALL ABSCESS;  Surgeon: Alphonsa Overall, MD;  Location: [facility];  Service: General;  Laterality: N/A;   INSERTION OF DIALYSIS CATHETER Right 08/03/2019   Procedure: INSERTION OF DIALYSIS CATHETER;  Surgeon:  Waynetta Sandy, MD;  Location: [place];  Service: Vascular;  Laterality: Right;   INSERTION OF DIALYSIS CATHETER Right 10/22/2019   Procedure: INSERTION OF 23CM TUNNELED DIALYSIS CATHETER RIGHT INTERNAL JUGULAR;  Surgeon: Angelia Mould, MD;  Location: [place];  Service: Vascular;  Laterality: Right;   INSERTION OF DIALYSIS CATHETER Right 08/12/2020   Procedure: INSERTION OF Right internal Jugular TUNNELED  DIALYSIS CATHETER.;  Surgeon: Waynetta Sandy, MD;  Location: [place];  Service: Vascular;  Laterality: Right;   Left heel surgery     PENILE BIOPSY N/A 03/26/2020   Procedure: PENILE ULCER DEBRIDEMENT;  Surgeon: Remi Haggard, MD;  Location: [facility];  Service: Urology;  Laterality: N/A;  [place]  01/12/2021   Procedure: SCLEROTHERAPY;  Surgeon: Sharyn Creamer, MD;  Location: [facility] ENDOSCOPY;  Service: Gastroenterology;;       Family History  Problem Relation Age of Onset   Colon cancer Neg Hx     Social History   Tobacco Use   Smoking status: Every Day    Packs/day: 1.00    Years: 30.00    Pack years: 30.00    Types: Cigarettes   Smokeless tobacco: Never  Vaping Use  Vaping Use: Never used  Substance Use Topics   Alcohol use: Not Currently    Alcohol/week: 12.0 standard drinks    Types: 12 Cans of beer per week   Drug use: Not Currently    Types: "Crack" cocaine    Comment: last in 2020    Home Medications Prior to Admission medications   Medication Sig Start Date End Date Taking? Authorizing Provider  amLODipine (NORVASC) 10 MG tablet Take 10 mg by mouth in the morning and at bedtime. 01/01/21  Yes [provider]  Calcium Acetate 667 MG TABS Take 1,334 mg by mouth 4 (four) times daily. 09/11/20  Yes [provider]  cetirizine (ZYRTEC ALLERGY) 10 MG tablet Take 1 tablet (10 mg total) by mouth daily as needed for allergies. 08/22/20  Yes [name], NP  cloNIDine (CATAPRES) 0.2 MG tablet Take  1 tablet twice  daily . Patient taking differently: Take 0.2 mg by mouth 2 (two) times daily. Take  1 tablet twice daily . 10/28/20  Yes Donnamae Jude, MD  doxepin (SINEQUAN) 10 MG capsule Take 10 mg by mouth at bedtime. 01/03/21  Yes [provider]  gabapentin (NEURONTIN) 100 MG capsule Take 1 capsule (100 mg total) by mouth at bedtime. 08/16/20  Yes Florencia Reasons, MD  gabapentin (NEURONTIN) 300 MG capsule Take 300 mg by mouth in the morning, at noon, and at bedtime. 02/04/21  Yes [provider]  hydrOXYzine (ATARAX/VISTARIL) 25 MG tablet TAKE 1 TABLET BY MOUTH EVERY 8 HOURS AS NEEDED FOR ANXIETY OR  ITCHING Patient taking differently: Take 25 mg by mouth in the morning and at bedtime. 11/07/20  Yes [name], NP  lidocaine-prilocaine (EMLA) cream APPLY SMALL AMOUNT TO ACCESS SITE (AVF) 1 TO 2 HOURS BEFORE DIALYSIS. COVER WITH OCCLUSIVE DRESSING (SARAN WRAP) 12/27/20  Yes [provider]  oxyCODONE-acetaminophen (PERCOCET/ROXICET) 5-325 MG tablet Take 1 tablet by mouth every 8 (eight) hours as needed for severe pain. 11/12/20  Yes Jaynee Eagles, PA-C  pantoprazole (PROTONIX) 40 MG tablet Take 1 tablet (40 mg total) by mouth 2 (two) times daily. 01/15/21  Yes Sharyn Creamer, MD  sucralfate (CARAFATE) 1 g tablet Take 1 g by mouth daily. 01/30/21  Yes [provider]  bacitracin ointment Apply 1 application topically 2 (two) times daily. Patient not taking: No sig reported 01/05/21   Regan Lemming, MD  blood glucose meter kit and supplies KIT Dispense based on patient and insurance preference. Use up to four times daily as directed. (FOR ICD-9 250.00, 250.01). 07/01/16   Nita Sells, MD  magic mouthwash (nystatin, lidocaine, diphenhydrAMINE, alum & mag hydroxide) suspension Swish and spit 5 mLs 4 (four) times daily as needed for mouth pain. Patient not taking: No sig reported 12/12/20   Hazel Sams, PA-C  nitroGLYCERIN (NITRODUR - DOSED IN MG/24 HR) 0.2 mg/hr patch Use 1/4  patch daily to the affected area Patient not taking: No sig reported 12/12/20   Hudnall, Sharyn Lull, MD  colchicine 0.6 MG tablet Take 0.5 tablets (0.3 mg total) by mouth 2 (two) times daily. 07/24/20 07/29/20  Noemi Chapel, MD  ferrous sulfate 325 (65 FE) MG tablet SMARTSIG:1 Tablet(s) By Mouth Twice Daily 08/11/19 02/09/20  [provider]  furosemide (LASIX) 40 MG tablet Take 1 tablet (40 mg total) by mouth daily. 07/25/19 02/09/20  Ladona Horns, MD    Allergies    Dilaudid [hydromorphone hcl]  Review of Systems   Review of Systems  Constitutional:  Positive for activity change and fatigue. Negative for appetite change and chills.  HENT:  Negative for congestion and trouble swallowing.   Eyes:  Positive for visual disturbance (intermittent diplopia). Negative for pain.  Respiratory:  Negative for cough, chest tightness and shortness of breath.   Cardiovascular:  Positive for leg swelling. Negative for chest pain and palpitations.  Gastrointestinal:  Positive for abdominal distention, abdominal pain and blood in stool. Negative for nausea and vomiting.  Genitourinary:  Negative for difficulty urinating.  Musculoskeletal:  Negative for joint swelling.       Weakness with legs causing several falls  Skin:        Several areas  Neurological:  Positive for dizziness, weakness and numbness. Negative for syncope.   Physical Exam Updated Vital Signs BP (!) 104/55   Pulse 69   Temp 98 F (36.7 C) (Oral)   Resp 14   SpO2 100%   Physical Exam Constitutional:      Appearance: He is obese.     Comments: Appears chronically ill  HENT:     Head: Normocephalic and atraumatic.     Right Ear: External ear normal.     Left Ear: External ear normal.     Nose: Nose normal.     Mouth/Throat:     Mouth: Mucous membranes are dry.  Eyes:     General: Scleral icterus present.     Extraocular Movements: Extraocular movements intact.     Pupils: Pupils are equal, round, and reactive to light.   Cardiovascular:     Rate and Rhythm: Normal rate and regular rhythm.     Pulses: Normal pulses.  Pulmonary:     Effort: Pulmonary effort is normal.     Breath sounds: Normal breath sounds.  Abdominal:     General: There is distension.     Tenderness: There is abdominal tenderness (epigastric). There is no guarding.  Musculoskeletal:        General: Normal range of motion.     Cervical back: Normal range of motion and neck supple.     Right lower leg: Edema (trace) present.     Left lower leg: Edema (trace) present.  Skin:    General: Skin is warm and dry.     Capillary Refill: Capillary refill takes less than 2 seconds.  Neurological:     Mental Status: He is alert.     Motor: Weakness (4/5 strength for BLE) present.   Painful wound present on back of left calf as pictured below    ED Results / Procedures / Treatments   Labs (all labs ordered are listed, but only abnormal results are displayed) Labs Reviewed  CBC WITH DIFFERENTIAL/PLATELET - Abnormal; Notable for the following components:      Result Value   RBC 1.95 (*)    Hemoglobin 7.0 (*)    HCT 22.9 (*)    MCV 117.4 (*)    MCH 35.9 (*)    RDW 18.8 (*)    Platelets 140 (*)    nRBC 1.3 (*)    nRBC 2 (*)    All other components within normal limits  COMPREHENSIVE METABOLIC PANEL - Abnormal; Notable for the following components:   Chloride 94 (*)    Glucose, Bld 252 (*)    Creatinine, Ser 6.53 (*)    Total Protein 8.5 (*)    Albumin 2.8 (*)    AST 46 (*)    GFR, Estimated 9 (*)    All other components within  normal limits  LIPASE, BLOOD - Abnormal; Notable for the following components:   Lipase 113 (*)    All other components within normal limits  TYPE AND SCREEN  PREPARE RBC (CROSSMATCH)  TROPONIN I (HIGH SENSITIVITY)  TROPONIN I (HIGH SENSITIVITY)    EKG EKG Interpretation  Date/Time:  Thursday February 07 2021 10:49:43 EDT Ventricular Rate:  73 PR Interval:  164 QRS Duration: 90 QT  Interval:  444 QTC Calculation: 489 R Axis:   -13 Text Interpretation: Normal sinus rhythm Minimal voltage criteria for LVH, may be normal variant ( R in aVL ) Cannot rule out Anterior infarct , age undetermined Abnormal ECG When compared to prior, slight t wave inversion in lead 3. no STEMI Confirmed by Antony Blackbird [id number]) on 02/07/2021 11:05:20 AM  Radiology CT ABDOMEN PELVIS WO CONTRAST  Result Date: 02/07/2021 CLINICAL DATA:  Abdominal distension EXAM: CT ABDOMEN AND PELVIS WITHOUT CONTRAST TECHNIQUE: Multidetector CT imaging of the abdomen and pelvis was performed following the standard protocol without IV contrast. COMPARISON:  CT abdomen and pelvis dated October 27, 2020 FINDINGS: Lower chest: Normal heart size. No pericardial effusion. Coronary artery calcifications. Mild bibasilar atelectasis. No acute abnormality. Hepatobiliary: No focal liver abnormality. Cholelithiasis with possible mild gallbladder wall thickening. No biliary ductal dilation. Pancreas: Unremarkable. No pancreatic ductal dilatation or surrounding inflammatory changes. Spleen: Normal in size without focal abnormality. Adrenals/Urinary Tract: Adrenal glands are unremarkable. Atrophic bilateral kidneys no hydronephrosis. Punctate nonobstructing stones of the left kidney. Bladder is unremarkable. Stomach/Bowel: Stomach is within normal limits. Appendix is surgically absent. No evidence of bowel wall thickening, distention, or inflammatory changes. Vascular/Lymphatic: Aortic atherosclerosis. No enlarged abdominal or pelvic lymph nodes. Reproductive: Prostate is unremarkable. Other: No abdominal wall hernia or abnormality. No abdominopelvic ascites. Musculoskeletal: No acute or significant osseous findings. IMPRESSION: Cholelithiasis with possible mild gallbladder wall thickening. Correlate for symptoms of right upper quadrant pain and consider further evaluation with gallbladder ultrasound. Otherwise, no acute findings in the abdomen  or pelvis. Aortic aneurysm NOS (ICD10-I71.9). Electronically Signed   By: Yetta Glassman M.D.   On: 02/07/2021 13:38   DG Chest 2 View  Result Date: 02/07/2021 CLINICAL DATA:  Weakness, epigastric pain EXAM: CHEST - 2 VIEW COMPARISON:  01/06/2021 FINDINGS: Unchanged cardiac and mediastinal contours, with redemonstrated cardiac enlargement and aortic atherosclerosis. Low lung volumes. Mild pulmonary interstitial thickening. No focal pulmonary opacity. No pleural effusion or pneumothorax. No acute osseous abnormality. IMPRESSION: Cardiomegaly and mild pulmonary interstitial thickening, which could be related to chronic bronchitis or pulmonary venous congestion. Electronically Signed   By: Merilyn Baba M.D.   On: 02/07/2021 12:02   US Abdomen Limited RUQ (LIVER/GB)  Result Date: 02/07/2021 CLINICAL DATA:  Cholelithiasis. Question cholecystitis. Abdominal distension. EXAM: ULTRASOUND ABDOMEN LIMITED RIGHT UPPER QUADRANT COMPARISON:  CT abdomen pelvis 02/07/2021 FINDINGS: Gallbladder: Calcified gallstones noted within the gallbladder lumen. Associated gallbladder wall thickening. No definite pericholecystic fluid; however, limited evaluation deep ended tray shin of the ultrasound waves. No sonographic Murphy sign noted by sonographer. Common bile duct: Diameter: 4 mm. Liver: No focal lesion identified. Slightly heterogeneous hepatic parenchyma which may be due to ultrasound technique. Otherwise normal in parenchymal echogenicity. Portal vein is patent on color Doppler imaging with normal direction of blood flow towards the liver. Other: None. IMPRESSION: 1. Cholelithiasis with associated nonspecific gallbladder wall thickening. No pericholecystic fluid or sonographic Percell Miller sign reported. Gallbladder wall thickening can be seen in the setting of chronic liver disease. Correlate with clinical exam for acute cholecystitis. 2. Limited  ultrasound to penetration of sonographic waves. Electronically Signed   By:  Iven Finn M.D.   On: 02/07/2021 15:33    Procedures Procedures   Medications Ordered in ED Medications  0.9 %  sodium chloride infusion (has no administration in time range)    ED Course  I have reviewed the triage vital signs and the nursing notes.  Pertinent labs & imaging results that were available during my care of the patient were reviewed by me and considered in my medical decision making (see chart for details).    MDM Rules/Calculators/A&P  JOVONNI BORQUEZ is a 61 y.o. male presenting with weakness and epigastric pain. PMH significant for HTN, T2DM, ESRD on HD, Anemia.  Patient with generalized weakness and epigastric pain with recent melena. Patient was admitted for hospitalization last month for symptomatic anemia with a bleeding duodenal ulcer that required a transfusion. Patient was unable to complete his dialysis session today due to the weakness and pain. Given his history of ulcers requiring intervention, obtained basic labs including CBC, CMP, Lipase, and troponin. CBC significant for anemia with Hgb of 7 and elevated lipase of 113 (which has been elevated in the past to 107). Patient was consented for blood transfusion. 1U PRBC ordered for transfusion.  CT scans showed cholelithiasis with possible mild wall thickening of gallbladder with no other acute findings. RUQ [place] was obtained and showed cholelithiasis with nonspecific gallbladder wall thickening (which can be seen in the setting of chronic liver disease). Do not feel that he has cholecystitis at this time.   Consulted with [facility] Ellouise Newer) who will come see the patient. Patient needs admission to the hospital for further work-up/follow-up regarding concern for further GI bleed as well as hemodialysis.  Final Clinical Impression(s) / ED Diagnoses Final diagnoses:  Symptomatic anemia  Cholelithiasis     Hisayo Delossantos, DO 02/07/21 1556    Tegeler, Gwenyth Allegra, MD 02/09/21 1358

## 2021-02-07 NOTE — ED Triage Notes (Signed)
Pt here with reports of generalized weakness onset yesterday. Pt did 2 hours of dialysis today. Pt appears fatigued.

## 2021-02-07 NOTE — H&P (View-Only) (Signed)
Consultation  Referring Provider:   Dr. Sherry Ruffing Primary Care Physician:  [name], NP Primary Gastroenterologist:   Althia Forts (if he follows in our clinic it will be with Dr. Havery Moros)    Reason for Consultation:   Melena, anemia         HPI:   Daniel [name] is a 60 y.o. male with a past medical history significant for ESRD on HD (Tuesday/Thursday/Saturday), hypertension, diabetes type 2, chronic anemia, PUD with recent bleeding ulcer, hepatitis C, history of cocaine abuse and alcohol abuse, who presented to the hospital today with increasing weakness and report of continued melena.    01/11/2021 consult by our service during patient's last hospitalization for anemia and melena.  At that time that he had history of bleeding duodenal ulcer in late April of this year and had continue taking NSAIDs.  Repeat EGD 01/12/2021 showed esophageal mucosal changes classified as Barrett's, medium amount of food residue in the stomach, oozing gastric ulcer with oozing hemorrhage which was injected with clips placed as well as nodular mucosa in the duodenal bulb.  At that time recommend the patient is a PPI p.o. twice daily for 3 months and Carafate suspension 1 g p.o. 4 times daily for 4 weeks.  An H. pylori antibody was checked and it was recommended he discontinue NSAID use.  Repeat EGD recommended in 2 to 3 months.    01/15/2021 patient was called regarding a positive H. pylori antibody.  He was started on bismuth quadruple therapy with tetracycline, Flagyl, bismuth and PPI.  Dosing was later adjusted given his ESRD.  Tetracycline started 500 mg 4 times daily was changed to daily and Bismuth from twice daily to daily.  His Carafate was also stopped.     Today, the patient presented to the ER and tells me that he has had increasing weakness, worse over the past week, and in fact had a fall 2 days ago.  Tells me that prior to all this he was able to walk fine and has not really sure why his legs  are so "weak".  Does tell me that he has been continuing with black stool at least twice a day ever since discharge at the end of September.  Tells me he was not using Pepto-Bismol or iron supplementation at home.  Does tell me that he finished his therapy for H. pylori as prescribed by our clinic about a week ago or so.  Associated symptoms include some mild epigastric pain somewhat worse after eating, ever since the end of September.    Denies fever, chills, weight loss or symptoms that awaken him from sleep.  GI history: See above for most recent history/admission, also more in chart  Past Medical History:  Diagnosis Date   Anemia    Diabetes mellitus without complication [facility])    patient denies   Dialysis patient [facility])    End stage chronic kidney disease ([place])    Hypertension    ICH (intracerebral hemorrhage) ([place]) 05/20/2017   Shoulder pain, left 06/28/2013    Past Surgical History:  Procedure Laterality Date   A/V FISTULAGRAM N/A 08/15/2020   Procedure: A/V FISTULAGRAM - Left Upper;  Surgeon: Cherre Robins, MD;  Location: [place] CV LAB;  Service: Cardiovascular;  Laterality: N/A;   APPENDECTOMY     AV FISTULA PLACEMENT Left 08/03/2019   Procedure: LEFT ARM ARTERIOVENOUS (AV) CEPHALIC  FISTULA CREATION;  Surgeon: Waynetta Sandy, MD;  Location: [place];  Service:  Vascular;  Laterality: Left;   BIOPSY  06/30/2019   Procedure: BIOPSY;  Surgeon: Ronald Lobo, MD;  Location: [place];  Service: Endoscopy;;   BIOPSY  08/02/2019   Procedure: BIOPSY;  Surgeon: Yetta Flock, MD;  Location: [place];  Service: Gastroenterology;;   COLONOSCOPY  01/23/2012   Procedure: COLONOSCOPY;  Surgeon: Danie Binder, MD;  [facility];  Service: Endoscopy;  Laterality: N/A;  11:10 AM   COLONOSCOPY WITH PROPOFOL N/A 06/30/2019   Procedure: COLONOSCOPY WITH PROPOFOL;  Surgeon: Ronald Lobo, MD;  Location: [place];  Service: Endoscopy;  Laterality: N/A;    ENTEROSCOPY N/A 08/02/2019   Procedure: ENTEROSCOPY;  Surgeon: Yetta Flock, MD;  Location: [facility] ENDOSCOPY;  Service: Gastroenterology;  Laterality: N/A;   ESOPHAGOGASTRODUODENOSCOPY N/A 08/10/2020   Procedure: ESOPHAGOGASTRODUODENOSCOPY (EGD);  Surgeon: Jerene Bears, MD;  Location: [facility]COPY;  Service: Gastroenterology;  Laterality: N/A;   ESOPHAGOGASTRODUODENOSCOPY (EGD) WITH PROPOFOL N/A 06/30/2019   Procedure: ESOPHAGOGASTRODUODENOSCOPY (EGD) WITH PROPOFOL;  Surgeon: Ronald Lobo, MD;  Location: [place];  Service: Endoscopy;  Laterality: N/A;   ESOPHAGOGASTRODUODENOSCOPY (EGD) WITH PROPOFOL N/A 01/12/2021   Procedure: ESOPHAGOGASTRODUODENOSCOPY (EGD) WITH PROPOFOL;  Surgeon: Sharyn Creamer, MD;  Location: [place];  Service: Gastroenterology;  Laterality: N/A;   FISTULA SUPERFICIALIZATION Left 10/17/2019   Procedure: LEFT UPPER EXTREMITY FISTULA REVISION, SIDE BRANCH LIGATION,  AND SUPERFICIALIZATION;  Surgeon: Marty Heck, MD;  Location: [place];  Service: Vascular;  Laterality: Left;   GIVENS CAPSULE STUDY N/A 06/30/2019   Procedure: GIVENS CAPSULE STUDY;  Surgeon: Ronald Lobo, MD;  Location: [place];  Service: Endoscopy;  Laterality: N/A;   HEMOSTASIS CLIP PLACEMENT  08/10/2020   Procedure: HEMOSTASIS CLIP PLACEMENT;  Surgeon: Jerene Bears, MD;  Location: [facility] ENDOSCOPY;  Service: Gastroenterology;;   HEMOSTASIS CLIP PLACEMENT  01/12/2021   Procedure: HEMOSTASIS CLIP PLACEMENT;  Surgeon: Sharyn Creamer, MD;  Location: [facility] ENDOSCOPY;  Service: Gastroenterology;;   HEMOSTASIS CONTROL  08/02/2019   Procedure: HEMOSTASIS CONTROL;  Surgeon: Yetta Flock, MD;  Location: [facility] ENDOSCOPY;  Service: Gastroenterology;;   INCISION AND DRAINAGE ABSCESS N/A 06/29/2016   Procedure: INCISION AND DRAINAGE ABDOMINAL WALL ABSCESS;  Surgeon: Alphonsa Overall, MD;  Location: [facility];  Service: General;  Laterality: N/A;   INSERTION OF DIALYSIS CATHETER Right 08/03/2019   Procedure:  INSERTION OF DIALYSIS CATHETER;  Surgeon: Waynetta Sandy, MD;  Location: [place];  Service: Vascular;  Laterality: Right;   INSERTION OF DIALYSIS CATHETER Right 10/22/2019   Procedure: INSERTION OF 23CM TUNNELED DIALYSIS CATHETER RIGHT INTERNAL JUGULAR;  Surgeon: Angelia Mould, MD;  Location: [place];  Service: Vascular;  Laterality: Right;   INSERTION OF DIALYSIS CATHETER Right 08/12/2020   Procedure: INSERTION OF Right internal Jugular TUNNELED  DIALYSIS CATHETER.;  Surgeon: Waynetta Sandy, MD;  Location: [place];  Service: Vascular;  Laterality: Right;   Left heel surgery     PENILE BIOPSY N/A 03/26/2020   Procedure: PENILE ULCER DEBRIDEMENT;  Surgeon: Remi Haggard, MD;  Location: [facility];  Service: Urology;  Laterality: N/A;  30 MINS   SCLEROTHERAPY  01/12/2021   Procedure: SCLEROTHERAPY;  Surgeon: Sharyn Creamer, MD;  Location: [facility] ENDOSCOPY;  Service: Gastroenterology;;    Family History  Problem Relation Age of Onset   Colon cancer Neg Hx     Social History   Tobacco Use   Smoking status: Every Day    Packs/day: 1.00    Years: 30.00    Pack  years: 30.00    Types: Cigarettes   Smokeless tobacco: Never  Vaping Use   Vaping Use: Never used  Substance Use Topics   Alcohol use: Not Currently    Alcohol/week: 12.0 standard drinks    Types: 12 Cans of beer per week   Drug use: Not Currently    Types: "Crack" cocaine    Comment: last in 2020    Prior to Admission medications   Medication Sig Start Date End Date Taking? Authorizing Provider  amLODipine (NORVASC) 10 MG tablet Take 10 mg by mouth in the morning and at bedtime. 01/01/21  Yes [provider]  Calcium Acetate 667 MG TABS Take 1,334 mg by mouth 4 (four) times daily. 09/11/20  Yes [provider]  cetirizine (ZYRTEC ALLERGY) 10 MG tablet Take 1 tablet (10 mg total) by mouth daily as needed for allergies. 08/22/20  Yes [name], NP  cloNIDine (CATAPRES) 0.2 MG tablet  Take  1 tablet twice daily . Patient taking differently: Take 0.2 mg by mouth 2 (two) times daily. Take  1 tablet twice daily . 10/28/20  Yes Donnamae Jude, MD  doxepin (SINEQUAN) 10 MG capsule Take 10 mg by mouth at bedtime. 01/03/21  Yes [provider]  gabapentin (NEURONTIN) 100 MG capsule Take 1 capsule (100 mg total) by mouth at bedtime. 08/16/20  Yes Florencia Reasons, MD  gabapentin (NEURONTIN) 300 MG capsule Take 300 mg by mouth in the morning, at noon, and at bedtime. 02/04/21  Yes [provider]  hydrOXYzine (ATARAX/VISTARIL) 25 MG tablet TAKE 1 TABLET BY MOUTH EVERY 8 HOURS AS NEEDED FOR ANXIETY OR  ITCHING Patient taking differently: Take 25 mg by mouth in the morning and at bedtime. 11/07/20  Yes [name], NP  lidocaine-prilocaine (EMLA) cream APPLY SMALL AMOUNT TO ACCESS SITE (AVF) 1 TO 2 HOURS BEFORE DIALYSIS. COVER WITH OCCLUSIVE DRESSING (SARAN WRAP) 12/27/20  Yes [provider]  oxyCODONE-acetaminophen (PERCOCET/ROXICET) 5-325 MG tablet Take 1 tablet by mouth every 8 (eight) hours as needed for severe pain. 11/12/20  Yes Jaynee Eagles, PA-C  pantoprazole (PROTONIX) 40 MG tablet Take 1 tablet (40 mg total) by mouth 2 (two) times daily. 01/15/21  Yes Sharyn Creamer, MD  sucralfate (CARAFATE) 1 g tablet Take 1 g by mouth daily. 01/30/21  Yes [provider]  bacitracin ointment Apply 1 application topically 2 (two) times daily. Patient not taking: No sig reported 01/05/21   Regan Lemming, MD  blood glucose meter kit and supplies KIT Dispense based on patient and insurance preference. Use up to four times daily as directed. (FOR ICD-9 250.00, 250.01). 07/01/16   Nita Sells, MD  magic mouthwash (nystatin, lidocaine, diphenhydrAMINE, alum & mag hydroxide) suspension Swish and spit 5 mLs 4 (four) times daily as needed for mouth pain. Patient not taking: No sig reported 12/12/20   Hazel Sams, PA-C  nitroGLYCERIN (NITRODUR - DOSED IN MG/24 HR) 0.2  mg/hr patch Use 1/4 patch daily to the affected area Patient not taking: No sig reported 12/12/20   Hudnall, Sharyn Lull, MD  colchicine 0.6 MG tablet Take 0.5 tablets (0.3 mg total) by mouth 2 (two) times daily. 07/24/20 07/29/20  Noemi Chapel, MD  ferrous sulfate 325 (65 FE) MG tablet SMARTSIG:1 Tablet(s) By Mouth Twice Daily 08/11/19 02/09/20  [provider]  furosemide (LASIX) 40 MG tablet Take 1 tablet (40 mg total) by mouth daily. 07/25/19 02/09/20  Ladona Horns, MD    Current Facility-Administered Medications  Medication Dose Route Frequency Provider Last Rate Last Admin   0.9 %  sodium chloride infusion  10 mL/hr Intravenous Once Lilland, Alana, DO       Current Outpatient Medications  Medication Sig Dispense Refill   amLODipine (NORVASC) 10 MG tablet Take 10 mg by mouth in the morning and at bedtime.     Calcium Acetate 667 MG TABS Take 1,334 mg by mouth 4 (four) times daily.     cetirizine (ZYRTEC ALLERGY) 10 MG tablet Take 1 tablet (10 mg total) by mouth daily as needed for allergies. 90 tablet 1   cloNIDine (CATAPRES) 0.2 MG tablet Take  1 tablet twice daily . (Patient taking differently: Take 0.2 mg by mouth 2 (two) times daily. Take  1 tablet twice daily .) 180 tablet 1   doxepin (SINEQUAN) 10 MG capsule Take 10 mg by mouth at bedtime.     gabapentin (NEURONTIN) 100 MG capsule Take 1 capsule (100 mg total) by mouth at bedtime. 30 capsule 0   gabapentin (NEURONTIN) 300 MG capsule Take 300 mg by mouth in the morning, at noon, and at bedtime.     hydrOXYzine (ATARAX/VISTARIL) 25 MG tablet TAKE 1 TABLET BY MOUTH EVERY 8 HOURS AS NEEDED FOR ANXIETY OR  ITCHING (Patient taking differently: Take 25 mg by mouth in the morning and at bedtime.) 90 tablet 0   lidocaine-prilocaine (EMLA) cream APPLY SMALL AMOUNT TO ACCESS SITE (AVF) 1 TO 2 HOURS BEFORE DIALYSIS. COVER WITH OCCLUSIVE DRESSING (SARAN WRAP)     oxyCODONE-acetaminophen (PERCOCET/ROXICET) 5-325 MG tablet Take 1 tablet by mouth  every 8 (eight) hours as needed for severe pain. 10 tablet 0   pantoprazole (PROTONIX) 40 MG tablet Take 1 tablet (40 mg total) by mouth 2 (two) times daily. 60 tablet 2   sucralfate (CARAFATE) 1 g tablet Take 1 g by mouth daily.     bacitracin ointment Apply 1 application topically 2 (two) times daily. (Patient not taking: No sig reported) 120 g 0   blood glucose meter kit and supplies KIT Dispense based on patient and insurance preference. Use up to four times daily as directed. (FOR ICD-9 250.00, 250.01). 1 each 0   magic mouthwash (nystatin, lidocaine, diphenhydrAMINE, alum & mag hydroxide) suspension Swish and spit 5 mLs 4 (four) times daily as needed for mouth pain. (Patient not taking: No sig reported) 180 mL 0   nitroGLYCERIN (NITRODUR - DOSED IN MG/24 HR) 0.2 mg/hr patch Use 1/4 patch daily to the affected area (Patient not taking: No sig reported) 30 patch 1    Allergies as of 02/07/2021 - Review Complete 02/07/2021  Allergen Reaction Noted   Dilaudid [hydromorphone hcl] Itching and Other (See Comments) 07/29/2020     Review of Systems:    Constitutional: No weight loss, fever or chills Skin: No rash  Cardiovascular: No chest pain  Respiratory: +DOE Gastrointestinal: See HPI and otherwise negative Genitourinary: No dysuria  Neurological: No headache Musculoskeletal: No new muscle or joint pain Hematologic: No bruising Psychiatric: No history of depression or anxiety    Physical Exam:  Vital signs in last 24 hours: Temp:  [97.6 F (36.4 C)] 97.6 F (36.4 C) (10/13 1057) Pulse Rate:  [66-76] 68 (10/13 1350) Resp:  [15-23] 15 (10/13 1355) BP: (91-114)/(54-62) 114/54 (10/13 1351) SpO2:  [93 %-100 %] 98 % (10/13 1350)   General:   Pleasant chronically ill appearing, Overweight AA male appears to be in NAD, Well developed, Well nourished, alert and cooperative Head:  Normocephalic  and atraumatic. Eyes:   PEERL, EOMI. No icterus. Conjunctiva pink. Ears:  Normal auditory  acuity. Neck:  Supple Throat: Oral cavity and pharynx without inflammation, swelling or lesion.  Lungs: Respirations even and unlabored. Lungs clear to auscultation bilaterally.   No wheezes, crackles, or rhonchi.  Heart: Normal S1, S2. No MRG. Regular rate and rhythm. No peripheral edema, cyanosis or pallor.  Abdomen:  Soft, nondistended, nontender. No rebound or guarding. Normal bowel sounds. No appreciable masses or hepatomegaly. Rectal:  Not performed.  Msk:  Symmetrical without gross deformities. Peripheral pulses intact.  Extremities:  Without edema, no deformity or joint abnormality.  Neurologic:  Alert and  oriented x4;  grossly normal neurologically.  Skin:   Dry and intact without significant lesions or rashes. Psychiatric: Demonstrates good judgement and reason without abnormal affect or behaviors.   LAB RESULTS: Recent Labs    02/07/21 1103  WBC 6.2  HGB 7.0*  HCT 22.9*  PLT 140*   BMET Recent Labs    02/07/21 1103  NA 137  K 3.9  CL 94*  CO2 30  GLUCOSE 252*  BUN 21  CREATININE 6.53*  CALCIUM 9.1   Hepatic Function Latest Ref Rng & Units 02/07/2021 01/11/2021 01/10/2021  Total Protein 6.5 - 8.1 g/dL 8.5(H) 7.3 7.7  Albumin 3.5 - 5.0 g/dL 2.8(L) 2.4(L) 2.6(L)  AST 15 - 41 U/L 46(H) 123(H) 126(H)  ALT 0 - 44 U/L 18 52(H) 57(H)  Alk Phosphatase 38 - 126 U/L 95 75 82  Total Bilirubin 0.3 - 1.2 mg/dL 0.6 1.1 0.7  Bilirubin, Direct 0.0 - 0.2 mg/dL - - -    STUDIES: CT ABDOMEN PELVIS WO CONTRAST  Result Date: 02/07/2021 CLINICAL DATA:  Abdominal distension EXAM: CT ABDOMEN AND PELVIS WITHOUT CONTRAST TECHNIQUE: Multidetector CT imaging of the abdomen and pelvis was performed following the standard protocol without IV contrast. COMPARISON:  CT abdomen and pelvis dated October 27, 2020 FINDINGS: Lower chest: Normal heart size. No pericardial effusion. Coronary artery calcifications. Mild bibasilar atelectasis. No acute abnormality. Hepatobiliary: No focal liver  abnormality. Cholelithiasis with possible mild gallbladder wall thickening. No biliary ductal dilation. Pancreas: Unremarkable. No pancreatic ductal dilatation or surrounding inflammatory changes. Spleen: Normal in size without focal abnormality. Adrenals/Urinary Tract: Adrenal glands are unremarkable. Atrophic bilateral kidneys no hydronephrosis. Punctate nonobstructing stones of the left kidney. Bladder is unremarkable. Stomach/Bowel: Stomach is within normal limits. Appendix is surgically absent. No evidence of bowel wall thickening, distention, or inflammatory changes. Vascular/Lymphatic: Aortic atherosclerosis. No enlarged abdominal or pelvic lymph nodes. Reproductive: Prostate is unremarkable. Other: No abdominal wall hernia or abnormality. No abdominopelvic ascites. Musculoskeletal: No acute or significant osseous findings. IMPRESSION: Cholelithiasis with possible mild gallbladder wall thickening. Correlate for symptoms of right upper quadrant pain and consider further evaluation with gallbladder ultrasound. Otherwise, no acute findings in the abdomen or pelvis. Aortic aneurysm NOS (ICD10-I71.9). Electronically Signed   By: Yetta Glassman M.D.   On: 02/07/2021 13:38   DG Chest 2 View  Result Date: 02/07/2021 CLINICAL DATA:  Weakness, epigastric pain EXAM: CHEST - 2 VIEW COMPARISON:  01/06/2021 FINDINGS: Unchanged cardiac and mediastinal contours, with redemonstrated cardiac enlargement and aortic atherosclerosis. Low lung volumes. Mild pulmonary interstitial thickening. No focal pulmonary opacity. No pleural effusion or pneumothorax. No acute osseous abnormality. IMPRESSION: Cardiomegaly and mild pulmonary interstitial thickening, which could be related to chronic bronchitis or pulmonary venous congestion. Electronically Signed   By: Merilyn Baba M.D.   On: 02/07/2021 12:02   US Abdomen Limited RUQ (  LIVER/GB)  Result Date: 02/07/2021 CLINICAL DATA:  Cholelithiasis. Question cholecystitis.  Abdominal distension. EXAM: ULTRASOUND ABDOMEN LIMITED RIGHT UPPER QUADRANT COMPARISON:  CT abdomen pelvis 02/07/2021 FINDINGS: Gallbladder: Calcified gallstones noted within the gallbladder lumen. Associated gallbladder wall thickening. No definite pericholecystic fluid; however, limited evaluation deep ended tray shin of the ultrasound waves. No sonographic Murphy sign noted by sonographer. Common bile duct: Diameter: 4 mm. Liver: No focal lesion identified. Slightly heterogeneous hepatic parenchyma which may be due to ultrasound technique. Otherwise normal in parenchymal echogenicity. Portal vein is patent on color Doppler imaging with normal direction of blood flow towards the liver. Other: None. IMPRESSION: 1. Cholelithiasis with associated nonspecific gallbladder wall thickening. No pericholecystic fluid or sonographic Percell Miller sign reported. Gallbladder wall thickening can be seen in the setting of chronic liver disease. Correlate with clinical exam for acute cholecystitis. 2. Limited ultrasound to penetration of sonographic waves. Electronically Signed   By: Iven Finn M.D.   On: 02/07/2021 15:33      Impression / Plan:   Impression: 1.  Anemia with melena: Hemoglobin 9 on 9/18-> 7.0 today--> 1 unit PRBCs ordered, reports continued black stools twice daily ever since time of discharge at the end of September with increasing weakness and a fall 2 days ago, denies continued NSAID use, did finish therapy for H. pylori; most likely upper GI bleed from recently clipped gastric ulcer 2.  Cholelithiasis with gallbladder wall thickening: Does not report any specific gallbladder symptoms today and LFTs are actually improved 3.  ESRD on HD: Tuesday/Thursday/Saturday 4.  Chronic HCV 5.  Element of chronically elevated LFTs: these actually seem improved this admission and have been thought related to alcohol use in the past  Plan: 1.  We will plan for repeat EGD tomorrow.  Did discuss risks, benefits,  limitations and alternatives with the patient and he agrees to proceed. 2.  Patient will be on clear liquid diet today (given that he had a medium amount of food residue at time of last EGD) and n.p.o. at midnight. 3.  Started Pantoprazole 40 mg IV twice daily 4.  Continue to monitor hemoglobin and transfuse as needed less than 7.  5.  Ordered recheck of CBC and CMP in the morning.  Thank you for your kind consultation, we will continue to follow.  Lavone Nian Camella Seim  02/07/2021, 3:42 PM

## 2021-02-07 NOTE — H&P (Addendum)
HISTORY AND PHYSICAL       PATIENT DETAILS Name: Daniel [name] Age: 60 y.o. Sex: male Date of Birth: 1960/10/01 Admit Date: 02/07/2021 ZOX:WRUEAVW, Milford Cage, NP   Patient coming from: Home   CHIEF COMPLAINT:  Epigastric pain Black tarry stools Weakness  HPI: Daniel [name] is a 60 y.o. male with medical history significant of ESRD on HD TTS, alcohol use, known history of peptic ulcer-most recent EGD on 9/17 requiring hemostasis, hepatitis C who was referred to the ED from his hemodialysis center for evaluation of worsening weakness.  Per patient-he has had worsening weakness for at least a week-and for the past few days he has had further worsening where it is very difficult for him to walk.  He has had epigastric pain for close to a week-and this has worsened over the past few days.  He claims he has had black tarry stools for approximately a month or so-but over the past 2-3 days this has become more frequent.  While he was at hemodialysis-he started having worsening of his epigastric pain-and as a result-he was referred to the emergency room.  He denies any fever, nausea, vomiting or diarrhea.   ED Course: Found to have a hemoglobin of 7.0-CT abdomen did not show any acute abnormalities-started on PRBC transfusion, GI consulted-TR[facility] called to admit patient.  Note: Lives at: Home Mobility:  Independent Chronic Indwelling Foley:no   REVIEW OF SYSTEMS:  Constitutional:   No  weight loss, night sweats,  Fevers, chills, fatigue.  HEENT:    No headaches, Dysphagia,Tooth/dental problems,Sore throat  Cardio-vascular: No chest pain,Orthopnea, PND, anasarca, palpitations  GI:  No heartburn, indigestion, abdominal pain, nausea, vomiting, diarrhea  Resp: No shortness of breath, cough, hemoptysis,plueritic chest pain.   Skin:  No rash or lesions.  GU:  No dysuria, change in color of urine, no urgency or frequency.  No flank  pain.  Musculoskeletal: No joint pain or swelling.  No decreased range of motion.  No back pain.  Endocrine: No heat intolerance, no cold intolerance, no polyuria, no polydipsia  Psych: No change in mood or affect. No depression or anxiety.  No memory loss.   ALLERGIES:   Allergies  Allergen Reactions   Dilaudid [Hydromorphone Hcl] Itching and Other (See Comments)    Pt reports itchiness after IM injection     PAST MEDICAL HISTORY: Past Medical History:  Diagnosis Date   Anemia    Diabetes mellitus without complication [facility])    patient denies   Dialysis patient [facility])    End stage chronic kidney disease ([place])    Hypertension    ICH (intracerebral hemorrhage) ([place]) 05/20/2017   Shoulder pain, left 06/28/2013    PAST SURGICAL HISTORY: Past Surgical History:  Procedure Laterality Date   A/V FISTULAGRAM N/A 08/15/2020   Procedure: A/V FISTULAGRAM - Left Upper;  Surgeon: Cherre Robins, MD;  Location: [place] CV LAB;  Service: Cardiovascular;  Laterality: N/A;   APPENDECTOMY     AV FISTULA PLACEMENT Left 08/03/2019   Procedure: LEFT ARM ARTERIOVENOUS (AV) CEPHALIC  FISTULA CREATION;  Surgeon: Waynetta Sandy, MD;  Location: [place];  Service: Vascular;  Laterality: Left;   BIOPSY  06/30/2019   Procedure: BIOPSY;  Surgeon: Ronald Lobo, MD;  Location: [place];  Service: Endoscopy;;   BIOPSY  08/02/2019   Procedure: BIOPSY;  Surgeon: Yetta Flock, MD;  Location: [place];  Service: Gastroenterology;;   COLONOSCOPY  01/23/2012  Procedure: COLONOSCOPY;  Surgeon: Danie Binder, MD;  [facility];  Service: Endoscopy;  Laterality: N/A;  11:10 AM   COLONOSCOPY WITH PROPOFOL N/A 06/30/2019   Procedure: COLONOSCOPY WITH PROPOFOL;  Surgeon: Ronald Lobo, MD;  Location: [place];  Service: Endoscopy;  Laterality: N/A;   ENTEROSCOPY N/A 08/02/2019   Procedure: ENTEROSCOPY;  Surgeon: Yetta Flock, MD;  Location: [facility] ENDOSCOPY;  Service:  Gastroenterology;  Laterality: N/A;   ESOPHAGOGASTRODUODENOSCOPY N/A 08/10/2020   Procedure: ESOPHAGOGASTRODUODENOSCOPY (EGD);  Surgeon: Jerene Bears, MD;  Location: [facility] ENDOSCOPY;  Service: Gastroenterology;  Laterality: N/A;   ESOPHAGOGASTRODUODENOSCOPY (EGD) WITH PROPOFOL N/A 06/30/2019   Procedure: ESOPHAGOGASTRODUODENOSCOPY (EGD) WITH PROPOFOL;  Surgeon: Ronald Lobo, MD;  Location: [place];  Service: Endoscopy;  Laterality: N/A;   ESOPHAGOGASTRODUODENOSCOPY (EGD) WITH PROPOFOL N/A 01/12/2021   Procedure: ESOPHAGOGASTRODUODENOSCOPY (EGD) WITH PROPOFOL;  Surgeon: Sharyn Creamer, MD;  Location: [place];  Service: Gastroenterology;  Laterality: N/A;   FISTULA SUPERFICIALIZATION Left 10/17/2019   Procedure: LEFT UPPER EXTREMITY FISTULA REVISION, SIDE BRANCH LIGATION,  AND SUPERFICIALIZATION;  Surgeon: Marty Heck, MD;  Location: [place];  Service: Vascular;  Laterality: Left;   GIVENS CAPSULE STUDY N/A 06/30/2019   Procedure: GIVENS CAPSULE STUDY;  Surgeon: Ronald Lobo, MD;  Location: [place];  Service: Endoscopy;  Laterality: N/A;   HEMOSTASIS CLIP PLACEMENT  08/10/2020   Procedure: HEMOSTASIS CLIP PLACEMENT;  Surgeon: Jerene Bears, MD;  Location: [facility] ENDOSCOPY;  Service: Gastroenterology;;   HEMOSTASIS CLIP PLACEMENT  01/12/2021   Procedure: HEMOSTASIS CLIP PLACEMENT;  Surgeon: Sharyn Creamer, MD;  Location: [facility] ENDOSCOPY;  Service: Gastroenterology;;   HEMOSTASIS CONTROL  08/02/2019   Procedure: HEMOSTASIS CONTROL;  Surgeon: Yetta Flock, MD;  Location: [facility] ENDOSCOPY;  Service: Gastroenterology;;   INCISION AND DRAINAGE ABSCESS N/A 06/29/2016   Procedure: INCISION AND DRAINAGE ABDOMINAL WALL ABSCESS;  Surgeon: Alphonsa Overall, MD;  Location: [facility];  Service: General;  Laterality: N/A;   INSERTION OF DIALYSIS CATHETER Right 08/03/2019   Procedure: INSERTION OF DIALYSIS CATHETER;  Surgeon: Waynetta Sandy, MD;  Location: [place];  Service: Vascular;  Laterality: Right;    INSERTION OF DIALYSIS CATHETER Right 10/22/2019   Procedure: INSERTION OF 23CM TUNNELED DIALYSIS CATHETER RIGHT INTERNAL JUGULAR;  Surgeon: Angelia Mould, MD;  Location: [place];  Service: Vascular;  Laterality: Right;   INSERTION OF DIALYSIS CATHETER Right 08/12/2020   Procedure: INSERTION OF Right internal Jugular TUNNELED  DIALYSIS CATHETER.;  Surgeon: Waynetta Sandy, MD;  Location: [place];  Service: Vascular;  Laterality: Right;   Left heel surgery     PENILE BIOPSY N/A 03/26/2020   Procedure: PENILE ULCER DEBRIDEMENT;  Surgeon: Remi Haggard, MD;  Location: [facility];  Service: Urology;  Laterality: N/A;  30 MINS   SCLEROTHERAPY  01/12/2021   Procedure: SCLEROTHERAPY;  Surgeon: Sharyn Creamer, MD;  Location: [facility] ENDOSCOPY;  Service: Gastroenterology;;    MEDICATIONS AT HOME: Prior to Admission medications   Medication Sig Start Date End Date Taking? Authorizing Provider  amLODipine (NORVASC) 10 MG tablet Take 10 mg by mouth in the morning and at bedtime. 01/01/21  Yes [provider]  Calcium Acetate 667 MG TABS Take 1,334 mg by mouth 4 (four) times daily. 09/11/20  Yes [provider]  cetirizine (ZYRTEC ALLERGY) 10 MG tablet Take 1 tablet (10 mg total) by mouth daily as needed for allergies. 08/22/20  Yes [name], NP  cloNIDine (CATAPRES) 0.2 MG tablet Take  1  tablet twice daily . Patient taking differently: Take 0.2 mg by mouth 2 (two) times daily. Take  1 tablet twice daily . 10/28/20  Yes Donnamae Jude, MD  doxepin (SINEQUAN) 10 MG capsule Take 10 mg by mouth at bedtime. 01/03/21  Yes [provider]  gabapentin (NEURONTIN) 100 MG capsule Take 1 capsule (100 mg total) by mouth at bedtime. 08/16/20  Yes Florencia Reasons, MD  gabapentin (NEURONTIN) 300 MG capsule Take 300 mg by mouth in the morning, at noon, and at bedtime. 02/04/21  Yes [provider]  hydrOXYzine (ATARAX/VISTARIL) 25 MG tablet TAKE 1 TABLET BY MOUTH EVERY 8 HOURS AS  NEEDED FOR ANXIETY OR  ITCHING Patient taking differently: Take 25 mg by mouth in the morning and at bedtime. 11/07/20  Yes [name], NP  lidocaine-prilocaine (EMLA) cream APPLY SMALL AMOUNT TO ACCESS SITE (AVF) 1 TO 2 HOURS BEFORE DIALYSIS. COVER WITH OCCLUSIVE DRESSING (SARAN WRAP) 12/27/20  Yes [provider]  oxyCODONE-acetaminophen (PERCOCET/ROXICET) 5-325 MG tablet Take 1 tablet by mouth every 8 (eight) hours as needed for severe pain. 11/12/20  Yes Jaynee Eagles, PA-C  pantoprazole (PROTONIX) 40 MG tablet Take 1 tablet (40 mg total) by mouth 2 (two) times daily. 01/15/21  Yes Sharyn Creamer, MD  sucralfate (CARAFATE) 1 g tablet Take 1 g by mouth daily. 01/30/21  Yes [provider]  bacitracin ointment Apply 1 application topically 2 (two) times daily. Patient not taking: No sig reported 01/05/21   Regan Lemming, MD  blood glucose meter kit and supplies KIT Dispense based on patient and insurance preference. Use up to four times daily as directed. (FOR ICD-9 250.00, 250.01). 07/01/16   Nita Sells, MD  magic mouthwash (nystatin, lidocaine, diphenhydrAMINE, alum & mag hydroxide) suspension Swish and spit 5 mLs 4 (four) times daily as needed for mouth pain. Patient not taking: No sig reported 12/12/20   Hazel Sams, PA-C  nitroGLYCERIN (NITRODUR - DOSED IN MG/24 HR) 0.2 mg/hr patch Use 1/4 patch daily to the affected area Patient not taking: No sig reported 12/12/20   Hudnall, Sharyn Lull, MD  colchicine 0.6 MG tablet Take 0.5 tablets (0.3 mg total) by mouth 2 (two) times daily. 07/24/20 07/29/20  Noemi Chapel, MD  ferrous sulfate 325 (65 FE) MG tablet SMARTSIG:1 Tablet(s) By Mouth Twice Daily 08/11/19 02/09/20  [provider]  furosemide (LASIX) 40 MG tablet Take 1 tablet (40 mg total) by mouth daily. 07/25/19 02/09/20  Ladona Horns, MD    FAMILY HISTORY: Family History  Problem Relation Age of Onset   Colon cancer Neg Hx      SOCIAL HISTORY:  reports  that he has been smoking cigarettes. He has a 30.00 pack-year smoking history. He has never used smokeless tobacco. He reports that he does not currently use alcohol after a past usage of about 12.0 standard drinks per week. He reports that he does not currently use drugs after having used the following drugs: "Crack" cocaine.  PHYSICAL EXAM: Blood pressure 121/61, pulse 68, temperature 97.7 F (36.5 C), temperature source Oral, resp. rate 13, SpO2 100 %.  General appearance :Awake, alert, not in any distress.  Eyes:, pupils equally reactive to light and accomodation,no scleral icterus.Pink conjunctiva HEENT: Atraumatic and Normocephalic Neck: supple, no JVD.  Resp:Good air entry bilaterally, no added sounds  CVS: S1 S2 regular, no murmurs.  GI: Soft but has tenderness in the epigastric area without any peritoneal signs.  Abdomen appears to be distended/obese. Extremities: ++  Edema Neurology:  speech clear,Non focal, sensation is grossly intact. Psychiatric: Normal judgment and insight. Alert and oriented x 3. Normal mood. Musculoskeletal:gait appears to be normal.No digital cyanosis Skin:No Rash, warm and dry Wounds:N/A  LABS ON ADMISSION:  I have personally reviewed following labs and imaging studies  CBC: Recent Labs  Lab 02/07/21 1103  WBC 6.2  NEUTROABS 4.0  HGB 7.0*  HCT 22.9*  MCV 117.4*  PLT 140*    Basic Metabolic Panel: Recent Labs  Lab 02/07/21 1103  NA 137  K 3.9  CL 94*  CO2 30  GLUCOSE 252*  BUN 21  CREATININE 6.53*  CALCIUM 9.1    GFR: CrCl cannot be calculated (Unknown ideal weight.).  Liver Function Tests: Recent Labs  Lab 02/07/21 1103  AST 46*  ALT 18  ALKPHOS 95  BILITOT 0.6  PROT 8.5*  ALBUMIN 2.8*   Recent Labs  Lab 02/07/21 1103  LIPASE 113*   No results for input(s): AMMONIA in the last 168 hours.  Coagulation Profile: No results for input(s): INR, PROTIME in the last 168 hours.  Cardiac Enzymes: No results for  input(s): CKTOTAL, CKMB, CKMBINDEX, TROPONINI in the last 168 hours.  BNP (last 3 results) No results for input(s): PROBNP in the last 8760 hours.  HbA1C: No results for input(s): HGBA1C in the last 72 hours.  CBG: No results for input(s): GLUCAP in the last 168 hours.  Lipid Profile: No results for input(s): CHOL, HDL, LDLCALC, TRIG, CHOLHDL, LDLDIRECT in the last 72 hours.  Thyroid Function Tests: No results for input(s): TSH, T4TOTAL, FREET4, T3FREE, THYROIDAB in the last 72 hours.  Anemia Panel: No results for input(s): VITAMINB12, FOLATE, FERRITIN, TIBC, IRON, RETICCTPCT in the last 72 hours.  Urine analysis:    Component Value Date/Time   COLORURINE YELLOW 07/31/2019 2128   APPEARANCEUR CLEAR 07/31/2019 2128   LABSPEC 1.009 07/31/2019 2128   PHURINE 5.0 07/31/2019 2128   GLUCOSEU NEGATIVE 07/31/2019 2128   HGBUR SMALL (A) 07/31/2019 2128   BILIRUBINUR NEGATIVE 07/31/2019 2128   KETONESUR NEGATIVE 07/31/2019 2128   PROTEINUR 100 (A) 07/31/2019 2128   UROBILINOGEN 0.2 12/31/2009 0746   NITRITE NEGATIVE 07/31/2019 2128   LEUKOCYTESUR NEGATIVE 07/31/2019 2128    Sepsis Labs: Lactic Acid, Venous    Component Value Date/Time   LATICACIDVEN 1.4 10/27/2020 1740     Microbiology: No results found for this or any previous visit (from the past 240 hour(s)).    RADIOLOGIC STUDIES ON ADMISSION: CT ABDOMEN PELVIS WO CONTRAST  Result Date: 02/07/2021 CLINICAL DATA:  Abdominal distension EXAM: CT ABDOMEN AND PELVIS WITHOUT CONTRAST TECHNIQUE: Multidetector CT imaging of the abdomen and pelvis was performed following the standard protocol without IV contrast. COMPARISON:  CT abdomen and pelvis dated October 27, 2020 FINDINGS: Lower chest: Normal heart size. No pericardial effusion. Coronary artery calcifications. Mild bibasilar atelectasis. No acute abnormality. Hepatobiliary: No focal liver abnormality. Cholelithiasis with possible mild gallbladder wall thickening. No biliary  ductal dilation. Pancreas: Unremarkable. No pancreatic ductal dilatation or surrounding inflammatory changes. Spleen: Normal in size without focal abnormality. Adrenals/Urinary Tract: Adrenal glands are unremarkable. Atrophic bilateral kidneys no hydronephrosis. Punctate nonobstructing stones of the left kidney. Bladder is unremarkable. Stomach/Bowel: Stomach is within normal limits. Appendix is surgically absent. No evidence of bowel wall thickening, distention, or inflammatory changes. Vascular/Lymphatic: Aortic atherosclerosis. No enlarged abdominal or pelvic lymph nodes. Reproductive: Prostate is unremarkable. Other: No abdominal wall hernia or abnormality. No abdominopelvic ascites. Musculoskeletal: No acute or significant osseous findings. IMPRESSION:  Cholelithiasis with possible mild gallbladder wall thickening. Correlate for symptoms of right upper quadrant pain and consider further evaluation with gallbladder ultrasound. Otherwise, no acute findings in the abdomen or pelvis. Aortic aneurysm NOS (ICD10-I71.9). Electronically Signed   By: Yetta Glassman M.D.   On: 02/07/2021 13:38   DG Chest 2 View  Result Date: 02/07/2021 CLINICAL DATA:  Weakness, epigastric pain EXAM: CHEST - 2 VIEW COMPARISON:  01/06/2021 FINDINGS: Unchanged cardiac and mediastinal contours, with redemonstrated cardiac enlargement and aortic atherosclerosis. Low lung volumes. Mild pulmonary interstitial thickening. No focal pulmonary opacity. No pleural effusion or pneumothorax. No acute osseous abnormality. IMPRESSION: Cardiomegaly and mild pulmonary interstitial thickening, which could be related to chronic bronchitis or pulmonary venous congestion. Electronically Signed   By: Merilyn Baba M.D.   On: 02/07/2021 12:02   US Abdomen Limited RUQ (LIVER/GB)  Result Date: 02/07/2021 CLINICAL DATA:  Cholelithiasis. Question cholecystitis. Abdominal distension. EXAM: ULTRASOUND ABDOMEN LIMITED RIGHT UPPER QUADRANT COMPARISON:  CT  abdomen pelvis 02/07/2021 FINDINGS: Gallbladder: Calcified gallstones noted within the gallbladder lumen. Associated gallbladder wall thickening. No definite pericholecystic fluid; however, limited evaluation deep ended tray shin of the ultrasound waves. No sonographic Murphy sign noted by sonographer. Common bile duct: Diameter: 4 mm. Liver: No focal lesion identified. Slightly heterogeneous hepatic parenchyma which may be due to ultrasound technique. Otherwise normal in parenchymal echogenicity. Portal vein is patent on color Doppler imaging with normal direction of blood flow towards the liver. Other: None. IMPRESSION: 1. Cholelithiasis with associated nonspecific gallbladder wall thickening. No pericholecystic fluid or sonographic Percell Miller sign reported. Gallbladder wall thickening can be seen in the setting of chronic liver disease. Correlate with clinical exam for acute cholecystitis. 2. Limited ultrasound to penetration of sonographic waves. Electronically Signed   By: Iven Finn M.D.   On: 02/07/2021 15:33     EKG:  Personally reviewed.  Normal sinus rhythm  ASSESSMENT AND PLAN: Recurrent upper GI bleeding with acute blood loss anemia superimposed on anemia due to ESRD: Suspect ongoing GI bleeding due to recurrence of peptic ulcer disease-likely due to ongoing alcohol use.  Starting PPI infusion-follow CBC-GI consulted and planning for EGD tomorrow.  ESRD on HD TTS: Had approximately 1.5 hours of of dialysis today-he appears to have significant lower extremity edema but otherwise appears comfortable-not hypoxic.  Nephrology consulted for dialysis needs-they will evaluate tomorrow.  Multifactorial anemia: Probably has normocytic anemia due to ESRD at baseline-worsened by ongoing GI bleeding.  Will defer need for Aranesp/IV iron to nephrology.  Epigastric pain: Likely due to peptic ulcer disease-although lipase is mildly elevated-does not have features of pancreatitis on CT abdomen.  Plan is to  continue PPI/Carafate-clear liquid diet today-and follow clinical course.  HTN: Continue clonidine/amlodipine but have reduced dosage due to GI bleeding-May need to be adjusted further.  Watch closely.  DM-2: Reportedly diet controlled-watch closely during this hospitalization.  Peripheral neuropathy: Continue Neurontin  EtOH abuse: Claims he has cut back significantly and drinks only on the weekends-but then acknowledges drinking 1 can of beer yesterday.  Unclear how truthful he is-plan is to monitor closely-start as needed Ativan per CIWA protocol.  Watch closely for development of any withdrawal symptoms.  Generalized weakness/debility: Suspect due to symptomatic anemia-should improve with transfusion-treatment of underlying GI bleeding.  Will obtain PT/OT eval as well  History of HCV  Further plan will depend as patient's clinical course evolves and further radiologic and laboratory data become available. Patient will be monitored closely.  Above noted plan was discussed with  patient  face to face at bedside, he was in agreement.   CONSULTS: GI Nephrology  DVT Prophylaxis: SCD's  Code Status: Full Code  Disposition Plan:  Discharge back home in the next 2-3 days.   Admission status: Inpatient going to tele  The medical decision making on this patient was of high complexity and the patient is at high risk for clinical deterioration, therefore this is a level 3 visit.   Total time spent  55 minutes.Greater than 50% of this time was spent in counseling, explanation of diagnosis, planning of further management, and coordination of care.  Severity of illness: The appropriate patient status for this patient is INPATIENT. Inpatient status is judged to be reasonable and necessary in order to provide the required intensity of service to ensure the patient's safety. The patient's presenting symptoms, physical exam findings, and initial radiographic and laboratory data in the context of  their chronic comorbidities is felt to place them at high risk for further clinical deterioration. Furthermore, it is not anticipated that the patient will be medically stable for discharge from the hospital within 2 midnights of admission. The following factors support the patient status of inpatient.   " The patient's presenting symptoms include weakness/melena " The worrisome physical exam findings include lower extremity edema, weakness " The initial radiographic and laboratory data are worrisome because of hemoglobin of 7 " The chronic co-morbidities include EtOH use, HTN, ESRD   * I certify that at the point of admission it is my clinical judgment that the patient will require inpatient hospital care spanning beyond 2 midnights from the point of admission due to high intensity of service, high risk for further deterioration and high frequency of surveillance required.**  Javen Hinderliter [facility] Pager [phone]  If 7PM-7AM, please contact night-coverage  Please page via [email or web address]  Go to [email or web address] and use [place]'s universal password to access. If you do not have the password, please contact the hospital operator.  Locate the [facility] provider you are looking for under [facility] and page to a number that you can be directly reached. If you still have difficulty reaching the provider, please page the [facility] (Director on Call) for the Hospitalists listed on amion for assistance.  02/07/2021, 4:39 PM

## 2021-02-07 NOTE — Consult Note (Signed)
ESRD Consult Note  Assessment/Recommendations:   # ESRD: -Outpatient orders: [place], TTS, 4 hours, Nipro, 450/500, 2K, 2 Cal, EDW 129 kg, aVF LUE, Hectorol 3 MCG q. treatment, Venofer 50 mg q. weekly, korsuva 1.2 mL q. treatment, Mircera 225 mcg every 2 weeks, sodium thiosulfate 25 g q. treatment.  No heparin. -Anticipate next HD on Sat 10/15. Unfortunately, we do not carry Nipro dialyzers here  # Volume/ hypertension: EDW 129kg. Attempt to achieve EDW as tolerated  # Anemia of Chronic Kidney Disease   GIB/melena: Hemoglobin 7. Currently receiving mircera 264mg, last dose 10/4-not due for ESA yet. GI on board, repeat EGD tomorrow, on PPI BID, transfuse PRN for hgb<7   # Secondary Hyperparathyroidism/Hyperphosphatemia: resume home binders and hectorol  # Calciphylaxis -continue with Na thiosulfate here  # Vascular access:LUE AVF +b/t  # Additional recommendations: - Dose all meds for creatinine clearance < 10 ml/min  - Unless absolutely necessary, no MRIs with gadolinium.  - Implement save arm precautions.  Prefer needle sticks in the dorsum of the hands or wrists.  No blood pressure measurements in arm. - If blood transfusion is requested during hemodialysis sessions, please alert u[place]oreaprior to the session.  - If a hemodialysis catheter line culture is requested, please alert u[place]as only hemodialysis nurses are able to collect those specimens.   Recommendations were discussed with the primary team.  VGean Quint MD [facility]  History of Present Illness: Daniel CHIARAis a/an 60y.o. male with a past medical history of ESRD on IHD TTS, PAD, alcohol use, history of otitis media, history of cocaine use who presents with worsening weakness.  He did have dialysis today for about 2 hours until he requested to get off. He is unable to tell me where he gets his dialysis or who sees him on HD, outpatient HD chart reviewed. Found to have a hgb of 7. Has been having melena  since his recent discharge. Has known PUD. Open for EGD tomorrow w/ GI. No other complaints from patient. Poor historian.   Medications:  Current Facility-Administered Medications  Medication Dose Route Frequency Provider Last Rate Last Admin   0.9 %  sodium chloride infusion  10 mL/hr Intravenous Once Lilland, Alana, DO       0.9 %  sodium chloride infusion  250 mL Intravenous PRN Ghimire, SHenreitta Leber MD       folic acid (FOLVITE) tablet 1 mg  1 mg Oral Daily Ghimire, SHenreitta Leber MD       LORazepam (ATIVAN) tablet 1-4 mg  1-4 mg Oral Q1H PRN Ghimire, SHenreitta Leber MD       Or   LORazepam (ATIVAN) injection 1-4 mg  1-4 mg Intravenous Q1H PRN Ghimire, SHenreitta Leber MD       multivitamin with minerals tablet 1 tablet  1 tablet Oral Daily Ghimire, SHenreitta Leber MD       ondansetron ([facility] tablet 4 mg  4 mg Oral Q6H PRN Ghimire, SHenreitta Leber MD       Or   ondansetron (ZOFRAN) injection 4 mg  4 mg Intravenous Q6H PRN Ghimire, SHenreitta Leber MD       pantoprazole (PROTONIX) injection 40 mg  40 mg Intravenous Q12H LLevin Erp PA       sodium chloride flush (NS) 0.9 % injection 3 mL  3 mL Intravenous Q12H Ghimire, Shanker M, MD       sodium chloride flush (NS) 0.9 % injection 3 mL  3 mL  Intravenous PRN Jonetta Osgood, MD       thiamine tablet 100 mg  100 mg Oral Daily Ghimire, Henreitta Leber, MD       Or   thiamine (B-1) injection 100 mg  100 mg Intravenous Daily Ghimire, Henreitta Leber, MD       Current Outpatient Medications  Medication Sig Dispense Refill   amLODipine (NORVASC) 10 MG tablet Take 10 mg by mouth in the morning and at bedtime.     Calcium Acetate 667 MG TABS Take 1,334 mg by mouth 4 (four) times daily.     cetirizine (ZYRTEC ALLERGY) 10 MG tablet Take 1 tablet (10 mg total) by mouth daily as needed for allergies. 90 tablet 1   cloNIDine (CATAPRES) 0.2 MG tablet Take  1 tablet twice daily . (Patient taking differently: Take 0.2 mg by mouth 2 (two) times daily. Take  1 tablet twice daily .)  180 tablet 1   doxepin (SINEQUAN) 10 MG capsule Take 10 mg by mouth at bedtime.     gabapentin (NEURONTIN) 100 MG capsule Take 1 capsule (100 mg total) by mouth at bedtime. 30 capsule 0   gabapentin (NEURONTIN) 300 MG capsule Take 300 mg by mouth in the morning, at noon, and at bedtime.     hydrOXYzine (ATARAX/VISTARIL) 25 MG tablet TAKE 1 TABLET BY MOUTH EVERY 8 HOURS AS NEEDED FOR ANXIETY OR  ITCHING (Patient taking differently: Take 25 mg by mouth in the morning and at bedtime.) 90 tablet 0   lidocaine-prilocaine (EMLA) cream APPLY SMALL AMOUNT TO ACCESS SITE (AVF) 1 TO 2 HOURS BEFORE DIALYSIS. COVER WITH OCCLUSIVE DRESSING (SARAN WRAP)     oxyCODONE-acetaminophen (PERCOCET/ROXICET) 5-325 MG tablet Take 1 tablet by mouth every 8 (eight) hours as needed for severe pain. 10 tablet 0   pantoprazole (PROTONIX) 40 MG tablet Take 1 tablet (40 mg total) by mouth 2 (two) times daily. 60 tablet 2   sucralfate (CARAFATE) 1 g tablet Take 1 g by mouth daily.     bacitracin ointment Apply 1 application topically 2 (two) times daily. (Patient not taking: No sig reported) 120 g 0   blood glucose meter kit and supplies KIT Dispense based on patient and insurance preference. Use up to four times daily as directed. (FOR ICD-9 250.00, 250.01). 1 each 0   magic mouthwash (nystatin, lidocaine, diphenhydrAMINE, alum & mag hydroxide) suspension Swish and spit 5 mLs 4 (four) times daily as needed for mouth pain. (Patient not taking: No sig reported) 180 mL 0   nitroGLYCERIN (NITRODUR - DOSED IN MG/24 HR) 0.2 mg/hr patch Use 1/4 patch daily to the affected area (Patient not taking: No sig reported) 30 patch 1     ALLERGIES Dilaudid [hydromorphone hcl]  MEDICAL HISTORY Past Medical History:  Diagnosis Date   Anemia    Diabetes mellitus without complication ([place])    patient denies   Dialysis patient [facility])    End stage chronic kidney disease ([place])    Hypertension    ICH (intracerebral hemorrhage) ([place]) 05/20/2017    Shoulder pain, left 06/28/2013     SOCIAL HISTORY Social History   Socioeconomic History   Marital status: Single    Spouse name: Not on file   Number of children: Not on file   Years of education: Not on file   Highest education level: Not on file  Occupational History   Not on file  Tobacco Use   Smoking status: Every Day    Packs/day: 1.00  Years: 30.00    Pack years: 30.00    Types: Cigarettes   Smokeless tobacco: Never  Vaping Use   Vaping Use: Never used  Substance and Sexual Activity   Alcohol use: Not Currently    Alcohol/week: 12.0 standard drinks    Types: 12 Cans of beer per week   Drug use: Not Currently    Types: "Crack" cocaine    Comment: last in 2020   Sexual activity: Yes    Birth control/protection: None  Other Topics Concern   Not on file  Social History Narrative   Not on file   Social Determinants of Health   Financial Resource Strain: Not on file  Food Insecurity: Not on file  Transportation Needs: Not on file  Physical Activity: Not on file  Stress: Not on file  Social Connections: Not on file  Intimate Partner Violence: Not on file     FAMILY HISTORY Family History  Problem Relation Age of Onset   Colon cancer Neg Hx      Review of Systems: 12 systems were reviewed and negative except per HPI  Physical Exam: Vitals:   02/07/21 1548 02/07/21 1600  BP: (!) 104/55 121/61  Pulse: 69 68  Resp: 14 13  Temp: 98 F (36.7 C) 97.7 F (36.5 C)  SpO2: 100% 100%   Total I/O In: 315 [Blood:315] Out: -   Intake/Output Summary (Last 24 hours) at 02/07/2021 1652 Last data filed at 02/07/2021 1548 Gross per 24 hour  Intake 315 ml  Output --  Net 315 ml   General: well-appearing, no acute distress HEENT: anicteric sclera, MMM CV: normal rate, no murmurs, no edema Lungs: bilateral chest rise, normal wob Abd: obese, soft, non-tender, non-distended Ext: +edema b/l LE's Neuro: awake, alert Dialysis access: LUE AVF +b/t  Test  Results Reviewed Lab Results  Component Value Date   NA 137 02/07/2021   K 3.9 02/07/2021   CL 94 (L) 02/07/2021   CO2 30 02/07/2021   BUN 21 02/07/2021   CREATININE 6.53 (H) 02/07/2021   CALCIUM 9.1 02/07/2021   ALBUMIN 2.8 (L) 02/07/2021   PHOS 4.9 (H) 10/28/2020    I have reviewed relevant outside healthcare records

## 2021-02-08 ENCOUNTER — Other Ambulatory Visit: Payer: Self-pay

## 2021-02-08 ENCOUNTER — Inpatient Hospital Stay (HOSPITAL_COMMUNITY): Payer: Medicare Other | Admitting: Anesthesiology

## 2021-02-08 ENCOUNTER — Encounter (HOSPITAL_COMMUNITY)
Admission: EM | Disposition: A | Discharge status: Home / Self Care | Payer: Self-pay | Source: Home / Self Care | Attending: Internal Medicine

## 2021-02-08 ENCOUNTER — Encounter (HOSPITAL_COMMUNITY): Payer: Self-pay | Admitting: Internal Medicine

## 2021-02-08 DIAGNOSIS — D649 Anemia, unspecified: Secondary | ICD-10-CM | POA: Diagnosis not present

## 2021-02-08 DIAGNOSIS — K2970 Gastritis, unspecified, without bleeding: Secondary | ICD-10-CM

## 2021-02-08 DIAGNOSIS — K3189 Other diseases of stomach and duodenum: Secondary | ICD-10-CM

## 2021-02-08 DIAGNOSIS — K259 Gastric ulcer, unspecified as acute or chronic, without hemorrhage or perforation: Secondary | ICD-10-CM

## 2021-02-08 DIAGNOSIS — N186 End stage renal disease: Secondary | ICD-10-CM | POA: Diagnosis not present

## 2021-02-08 DIAGNOSIS — I10 Essential (primary) hypertension: Secondary | ICD-10-CM | POA: Diagnosis not present

## 2021-02-08 DIAGNOSIS — K922 Gastrointestinal hemorrhage, unspecified: Secondary | ICD-10-CM | POA: Diagnosis not present

## 2021-02-08 HISTORY — PX: BIOPSY: SHX5522

## 2021-02-08 HISTORY — PX: ESOPHAGOGASTRODUODENOSCOPY (EGD) WITH PROPOFOL: SHX5813

## 2021-02-08 LAB — COMPREHENSIVE METABOLIC PANEL
ALT: 17 U/L (ref 0–44)
AST: 40 U/L (ref 15–41)
Albumin: 2.5 g/dL — ABNORMAL LOW (ref 3.5–5.0)
Alkaline Phosphatase: 88 U/L (ref 38–126)
Anion gap: 13 (ref 5–15)
BUN: 29 mg/dL — ABNORMAL HIGH (ref 8–23)
CO2: 30 mmol/L (ref 22–32)
Calcium: 9.5 mg/dL (ref 8.9–10.3)
Chloride: 93 mmol/L — ABNORMAL LOW (ref 98–111)
Creatinine, Ser: 8.22 mg/dL — ABNORMAL HIGH (ref 0.61–1.24)
GFR, Estimated: 7 mL/min — ABNORMAL LOW (ref >60)
Glucose, Bld: 113 mg/dL — ABNORMAL HIGH (ref 70–99)
Potassium: 4.1 mmol/L (ref 3.5–5.1)
Sodium: 136 mmol/L (ref 135–145)
Total Bilirubin: 1 mg/dL (ref 0.3–1.2)
Total Protein: 7.5 g/dL (ref 6.5–8.1)

## 2021-02-08 LAB — BPAM RBC
Blood Product Expiration Date: 202210252359
ISSUE DATE / TIME: 202210131521
Unit Type and Rh: 7300

## 2021-02-08 LAB — CBC WITH DIFFERENTIAL/PLATELET
Abs Immature Granulocytes: 0.08 10*3/uL — ABNORMAL HIGH (ref 0.00–0.07)
Basophils Absolute: 0 10*3/uL (ref 0.0–0.1)
Basophils Relative: 1 %
Eosinophils Absolute: 0.1 10*3/uL (ref 0.0–0.5)
Eosinophils Relative: 1 %
HCT: 24.3 % — ABNORMAL LOW (ref 39.0–52.0)
Hemoglobin: 7.8 g/dL — ABNORMAL LOW (ref 13.0–17.0)
Immature Granulocytes: 1 %
Lymphocytes Relative: 35 %
Lymphs Abs: 2 10*3/uL (ref 0.7–4.0)
MCH: 35.5 pg — ABNORMAL HIGH (ref 26.0–34.0)
MCHC: 32.1 g/dL (ref 30.0–36.0)
MCV: 110.5 fL — ABNORMAL HIGH (ref 80.0–100.0)
Monocytes Absolute: 0.7 10*3/uL (ref 0.1–1.0)
Monocytes Relative: 13 %
Neutro Abs: 2.9 10*3/uL (ref 1.7–7.7)
Neutrophils Relative %: 49 %
Platelets: 126 10*3/uL — ABNORMAL LOW (ref 150–400)
RBC: 2.2 MIL/uL — ABNORMAL LOW (ref 4.22–5.81)
RDW: 21 % — ABNORMAL HIGH (ref 11.5–15.5)
WBC: 5.8 10*3/uL (ref 4.0–10.5)
nRBC: 1.2 % — ABNORMAL HIGH (ref 0.0–0.2)

## 2021-02-08 LAB — TYPE AND SCREEN
ABO/RH(D): B POS
Antibody Screen: NEGATIVE
Unit division: 0

## 2021-02-08 LAB — RESP PANEL BY RT-PCR (FLU A&B, COVID) ARPGX2
Influenza A by PCR: NEGATIVE
Influenza B by PCR: NEGATIVE
SARS Coronavirus 2 by RT PCR: NEGATIVE

## 2021-02-08 LAB — HEPATITIS B SURFACE ANTIGEN: Hepatitis B Surface Ag: NONREACTIVE

## 2021-02-08 SURGERY — ESOPHAGOGASTRODUODENOSCOPY (EGD) WITH PROPOFOL
Anesthesia: Monitor Anesthesia Care

## 2021-02-08 MED ORDER — SODIUM CHLORIDE 0.9 % IV SOLN: INTRAVENOUS | Status: DC | PRN

## 2021-02-08 MED ORDER — CHLORHEXIDINE GLUCONATE CLOTH 2 % EX PADS: 6.0000 | MEDICATED_PAD | Freq: Every day | CUTANEOUS | Status: DC

## 2021-02-08 MED ORDER — PROPOFOL 10 MG/ML IV BOLUS
INTRAVENOUS | Status: DC | PRN
  Administered 2021-02-08: 100 ug/kg/min via INTRAVENOUS

## 2021-02-08 MED ORDER — CHLORHEXIDINE GLUCONATE CLOTH 2 % EX PADS
6.0000 | MEDICATED_PAD | Freq: Every day | CUTANEOUS | Status: DC
  Administered 2021-02-08 – 2021-02-09 (×2): 6 via TOPICAL

## 2021-02-08 SURGICAL SUPPLY — 15 items

## 2021-02-08 NOTE — Op Note (Signed)
[facility] Patient Name: Daniel [name] Procedure Date : 02/08/2021 MRN: [id number] Attending MD: Georgian Co ,  Date of Birth: 1960/06/15 CSN: [id number] Age: 60 Admit Type: Inpatient Procedure:                Upper GI endoscopy Indications:              Melena, Anemia Providers:                Sonny Masters "Lyndee Leo" Pollyann Glen Person, Faustina                            Mbumina, Technician, Hinton Dyer Referring MD:             Hospitalist group Medicines:                Monitored Anesthesia Care Complications:            No immediate complications. Estimated Blood Loss:     Estimated blood loss was minimal. Procedure:                Pre-Anesthesia Assessment:                           - Prior to the procedure, a History and Physical                            was performed, and patient medications and                            allergies were reviewed. The patient's tolerance of                            previous anesthesia was also reviewed. The risks                            and benefits of the procedure and the sedation                            options and risks were discussed with the patient.                            All questions were answered, and informed consent                            was obtained. Prior Anticoagulants: The patient has                            taken no previous anticoagulant or antiplatelet                            agents. ASA Grade Assessment: III - A patient with                            severe systemic disease. After reviewing the risks  and benefits, the patient was deemed in                            satisfactory condition to undergo the procedure.                           After obtaining informed consent, the endoscope was                            passed under direct vision. Throughout the                            procedure, the patient's blood pressure, pulse, and                             oxygen saturations were monitored continuously. The                            GIF-H190 ([id number]) Olympus endoscope was introduced                            through the mouth, and advanced to the third part                            of duodenum. The upper GI endoscopy was                            accomplished without difficulty. The patient                            tolerated the procedure well. Scope In: Scope Out: Findings:      There were esophageal mucosal changes classified as Barrett's stage       C0-M1 per Prague criteria present in the lower third of the esophagus.       The maximum longitudinal extent of these mucosal changes was 1 cm in       length.      Localized mild inflammation characterized by friability and granularity       was found in the stomach. Biopsies were taken with a cold forceps for       Helicobacter pylori testing.      One non-bleeding cratered gastric ulcer with a flat pigmented spot       (Forrest Class IIc) was found in the gastric antrum. The lesion was 2 mm       in largest dimension.      Two endoclips were found in the gastric antrum, which had prolapsed into       the dudoenal bulb.      Nodular mucosa was found in the duodenal bulb. Impression:               - Normal esophagus.                           - Gastritis vs. PHG. Biopsied.                           -  Non-bleeding gastric ulcer with a flat pigmented                            spot (Forrest Class IIc).                           - Two endoclips were found on the stomach,                            prolapsed into the duodenal bulb.                           - Nodular mucosa in the duodenal bulb. Recommendation:           - Return patient to hospital ward for ongoing care.                           - Overall it appears that the gastric ulcer that                            was previously visualized had improved (was not                            actively bleeding). There  was some heme found in                            the stomach overlying an area of gastritis. Patient                            may have had some slow blood loss from gastritis.                            Patient's iron study labs are not consistent with                            IDA so he may have multifactorial etiologies for                            his anemia (e.g. low erythropoetin due to chronic                            renal disease).                           - Use a proton pump inhibitor PO BID for 8 weeks.                           - Recommend cessation of alcohol use, as this is                            likely worsening his gastritis.                           - Await pathology results,  which will determine if                            patient still has infection with H pylori.                           - Repeat upper endoscopy in 8 weeks to check                            healing.                           - The findings and recommendations were discussed                            with the patient. Procedure Code(s):        --- Professional ---                           [phone], Esophagogastroduodenoscopy, flexible,                            transoral; with biopsy, single or multiple Diagnosis Code(s):        --- Professional ---                           K29.70, Gastritis, unspecified, without bleeding                           K25.9, Gastric ulcer, unspecified as acute or                            chronic, without hemorrhage or perforation                           T18.2XXA, Foreign body in stomach, initial encounter                           K31.89, Other diseases of stomach and duodenum                           K92.1, Melena (includes Hematochezia)                           D64.9, Anemia, unspecified CPT copyright 2019 American Medical Association. All rights reserved. The codes documented in this report are preliminary and upon coder review may  be revised to  meet current compliance requirements. Sonny Masters "Christia Reading,  02/08/2021 1:55:36 PM Number of Addenda: 0

## 2021-02-08 NOTE — Evaluation (Signed)
Physical Therapy Evaluation Patient Details Name: Daniel [name] MRN: [id number] DOB: [birth year] Today's Date: 02/08/2021  History of Present Illness  Pt adm 10/13 with upper GI bleed and acute blood loss anemia. Pt transfused 1 unit. PMH - ESRD on HD, calciphylaxis, HTN, etoh abuse, peripheral neuropathy, DM  Clinical Impression  Pt presents to PT close to or at baseline level of function. Pt amb in hallway without difficulty. Pt reports at home he gets weak and falls ~2x/wk. Asked pt about HHPT to assess falls at home but pt not interested in follow up PT at this time. Ready for dc home from PT standpoint.        Recommendations for follow up therapy are one component of a multi-disciplinary discharge planning process, led by the attending physician.  Recommendations may be updated based on patient status, additional functional criteria and insurance authorization.  Follow Up Recommendations No PT follow up    Equipment Recommendations  None recommended by PT    Recommendations for Other Services       Precautions / Restrictions Precautions Precautions: Fall      Mobility  Bed Mobility               General bed mobility comments: Pt up in chair    Transfers Overall transfer level: Modified independent Equipment used: None                Ambulation/Gait Ambulation/Gait assistance: Modified independent (Device/Increase time);Supervision Gait Distance (Feet): 225 Feet Assistive device: None Gait Pattern/deviations: Step-through pattern;Wide base of support Gait velocity: decr Gait velocity interpretation: 1.31 - 2.62 ft/sec, indicative of limited community ambulator General Gait Details: No loss of balance or instability  Stairs            Wheelchair Mobility    Modified Rankin (Stroke Patients Only)       Balance Overall balance assessment: Mild deficits observed, not formally tested                                            Pertinent Vitals/Pain Pain Assessment: No/denies pain    Home Living Family/patient expects to be discharged to:: Private residence Living Arrangements: Other relatives (cousin) Available Help at Discharge: Family;Other (Comment) Type of Home: Apartment Home Access: Level entry     Home Layout: Two level;Bed/bath upstairs Home Equipment: Walker - 2 wheels      Prior Function Level of Independence: Independent         Comments: Drives himself to HD. Fatigues on stairs at home. Reports 2 falls per week     Hand Dominance   Dominant Hand: Right    Extremity/Trunk Assessment   Upper Extremity Assessment Upper Extremity Assessment: Defer to OT evaluation    Lower Extremity Assessment Lower Extremity Assessment: Generalized weakness       Communication   Communication: No difficulties  Cognition Arousal/Alertness: Awake/alert Behavior During Therapy: Flat affect Overall Cognitive Status: Within Functional Limits for tasks assessed                                        General Comments      Exercises     Assessment/Plan    PT Assessment Patent does not need any further PT services (Pt not interested in any follow  up PT to address repeated falls at home)  PT Problem List         PT Treatment Interventions      PT Goals (Current goals can be found in the Care Plan section)  Acute Rehab PT Goals Patient Stated Goal: go home PT Goal Formulation: All assessment and education complete, DC therapy    Frequency     Barriers to discharge        Co-evaluation               AM-PAC PT "6 Clicks" Mobility  Outcome Measure Help needed turning from your back to your side while in a flat bed without using bedrails?: None Help needed moving from lying on your back to sitting on the side of a flat bed without using bedrails?: None Help needed moving to and from a bed to a chair (including a wheelchair)?: None Help needed standing up  from a chair using your arms (e.g., wheelchair or bedside chair)?: None Help needed to walk in hospital room?: None Help needed climbing 3-5 steps with a railing? : A Little 6 Click Score: 23    End of Session   Activity Tolerance: Patient tolerated treatment well Patient left: in chair;with call bell/phone within reach   PT Visit Diagnosis: Muscle weakness (generalized) (M62.81)    Time: 0601-5615 PT Time Calculation (min) (ACUTE ONLY): 13 min   Charges:   PT Evaluation $PT Eval Low Complexity: 1 Low          [place] Pager [phone] Office [place] 02/08/2021, 11:52 AM

## 2021-02-08 NOTE — Progress Notes (Signed)
PROGRESS NOTE        PATIENT DETAILS Name: Daniel [name] Age: 60 y.o. Sex: male Date of Birth: 1960/12/27 Admit Date: 02/07/2021 Admitting Physician Evalee Mutton Kristeen Mans, MD [id number], [place] Cage, NP  Brief Narrative: Patient is a 60 y.o. male with history of recent hospitalization for upper GI bleeding-from bleeding peptic ulcer disease (most recent EGD on 9/17)-presented with weakness and worsening anemia along with melanotic stools-found to have upper GI bleeding and acute blood loss anemia.  See below for further details.  Subjective: Lying comfortably in bed-denies any chest pain or shortness of breath.  Claims he had dark-colored stools earlier this morning.  Remains n.p.o. for EGD.  Objective: Vitals: Blood pressure (!) 119/43, pulse 78, temperature 98 F (36.7 C), temperature source Oral, resp. rate 13, SpO2 93 %.   Exam: Gen Exam:Alert awake-not in any distress HEENT:atraumatic, normocephalic Chest: B/L clear to auscultation anteriorly CVS:S1S2 regular Abdomen:soft non tender, non distended Extremities:++edema Neurology: Non focal Skin: no rash  Pertinent Labs/Radiology: WBC: 5.8 Hb: 7.8 Plt: 126 Na: 136 K: 4.1 Creatinine: 8.2 to  10/13>> CT abdomen/pelvis: Cholelithiasis-no acute findings. 10/13>> RUQ ultrasound: Cholelithiasis-nonspecific gallbladder wall thickening  Assessment/Plan: Recurrent upper GI bleeding with acute blood loss anemia superimposed on anemia due to ESRD: Suspect ongoing GI bleeding due to recurrence of peptic ulcer disease-likely due to ongoing alcohol use.  CBC relatively stable after 1 unit of PRBC transfusion last night.  Continue PPI for infusion-follow CBC-EGD scheduled for later today.     ESRD on HD TTS: Continues to have lower extremity edema-nephrology following-scheduled for dialysis tomorrow.  tomorrow.  History of calciphylaxis: Continue sodium thiosulfate   Multifactorial anemia: Probably has  normocytic anemia due to ESRD at baseline-worsened by ongoing GI bleeding.  Will defer need for Aranesp/IV iron to nephrology.   Epigastric pain: Likely due to peptic ulcer disease-although lipase is mildly elevated-does not have features of pancreatitis on CT abdomen.  Pain seems to be better today-continue PPI/Carafate.  Await EGD.    HTN: BP stable with clonidine/amlodipine (half of home dosage) follow and adjust accordingly.    DM-2: Reportedly diet controlled-watch closely during this hospitalization.   Peripheral neuropathy: Continue Neurontin   EtOH abuse: Claims he has cut back significantly and drinks only on the weekends-but then acknowledges drinking 1 can of beer yesterday.  Unclear how truthful he is-plan is to monitor closely-continue Ativan per CIWA protocol-no withdrawal symptoms as of yet.  Generalized weakness/debility: Suspect due to symptomatic anemia-should improve with transfusion-treatment of underlying GI bleeding.  Await PT/OT eval  History of HCV  Obesity: Estimated body mass index is 36.26 kg/m as calculated from the following:   Height as of 01/23/21: 5\' 11"  (1.803 m).   Weight as of 01/23/21: 117.9 kg.    Procedures: None Consults: GI, renal DVT Prophylaxis: SCD's Code Status:Full code  Family Communication: None at bedside  Time spent:  35 minutes-Greater than 50% of this time was spent in counseling, explanation of diagnosis, planning of further management, and coordination of care.  Diet: Diet Order             Diet NPO time specified  Diet effective midnight                      Disposition Plan: Status is: Inpatient  Remains inpatient appropriate because: GI bleeding with  acute blood loss anemia-needs inpatient EGD and stabilization before consideration of discharge.   Barriers to Discharge: UGI with acute blood loss anemia-for EGD on 10/14. Antimicrobial agents: Anti-infectives (From admission, onward)    None         MEDICATIONS: Scheduled Meds:  amLODipine  5 mg Oral Daily   calcium acetate  1,334 mg Oral TID PC & HS   Chlorhexidine Gluconate Cloth  6 each Topical Q0600   cloNIDine  0.1 mg Oral BID   doxepin  10 mg Oral QHS   [START ON 02/09/2021] doxercalciferol  3 mcg Intravenous Q T,Th,Sa-HD   folic acid  1 mg Oral Daily   gabapentin  100 mg Oral QHS   gabapentin  300 mg Oral TID   multivitamin with minerals  1 tablet Oral Daily   nicotine  21 mg Transdermal Daily   [START ON 02/11/2021] pantoprazole  40 mg Intravenous Q12H   sodium chloride flush  3 mL Intravenous Q12H   sucralfate  1 g Oral Daily   thiamine  100 mg Oral Daily   Or   thiamine  100 mg Intravenous Daily   Continuous Infusions:  sodium chloride     sodium chloride     pantoprazole     pantoprazole 8 mg/hr (02/08/21 0800)   [START ON 02/09/2021] sodium thiosulfate infusion for calciphylaxis     PRN Meds:.sodium chloride, hydrOXYzine, loratadine, LORazepam **OR** LORazepam, morphine injection, ondansetron **OR** ondansetron (ZOFRAN) IV, sodium chloride flush   I have personally reviewed following labs and imaging studies  LABORATORY DATA: CBC: Recent Labs  Lab 02/07/21 1103 02/07/21 2043 02/08/21 0206  WBC 6.2 5.6 5.8  NEUTROABS 4.0  --  2.9  HGB 7.0* 7.9* 7.8*  HCT 22.9* 24.1* 24.3*  MCV 117.4* 110.6* 110.5*  PLT 140* 125* 126*    Basic Metabolic Panel: Recent Labs  Lab 02/07/21 1103 02/08/21 0206  NA 137 136  K 3.9 4.1  CL 94* 93*  CO2 30 30  GLUCOSE 252* 113*  BUN 21 29*  CREATININE 6.53* 8.22*  CALCIUM 9.1 9.5    GFR: CrCl cannot be calculated (Unknown ideal weight.).  Liver Function Tests: Recent Labs  Lab 02/07/21 1103 02/08/21 0206  AST 46* 40  ALT 18 17  ALKPHOS 95 88  BILITOT 0.6 1.0  PROT 8.5* 7.5  ALBUMIN 2.8* 2.5*   Recent Labs  Lab 02/07/21 1103  LIPASE 113*   No results for input(s): AMMONIA in the last 168 hours.  Coagulation Profile: No results for  input(s): INR, PROTIME in the last 168 hours.  Cardiac Enzymes: No results for input(s): CKTOTAL, CKMB, CKMBINDEX, TROPONINI in the last 168 hours.  BNP (last 3 results) No results for input(s): PROBNP in the last 8760 hours.  Lipid Profile: No results for input(s): CHOL, HDL, LDLCALC, TRIG, CHOLHDL, LDLDIRECT in the last 72 hours.  Thyroid Function Tests: No results for input(s): TSH, T4TOTAL, FREET4, T3FREE, THYROIDAB in the last 72 hours.  Anemia Panel: No results for input(s): VITAMINB12, FOLATE, FERRITIN, TIBC, IRON, RETICCTPCT in the last 72 hours.  Urine analysis:    Component Value Date/Time   COLORURINE YELLOW 07/31/2019 2128   APPEARANCEUR CLEAR 07/31/2019 2128   LABSPEC 1.009 07/31/2019 2128   PHURINE 5.0 07/31/2019 2128   GLUCOSEU NEGATIVE 07/31/2019 2128   HGBUR SMALL (A) 07/31/2019 2128   BILIRUBINUR NEGATIVE 07/31/2019 2128   KETONESUR NEGATIVE 07/31/2019 2128   PROTEINUR 100 (A) 07/31/2019 2128   UROBILINOGEN 0.2 12/31/2009 0746  NITRITE NEGATIVE 07/31/2019 2128   LEUKOCYTESUR NEGATIVE 07/31/2019 2128    Sepsis Labs: Lactic Acid, Venous    Component Value Date/Time   LATICACIDVEN 1.4 10/27/2020 1740    MICROBIOLOGY: No results found for this or any previous visit (from the past 240 hour(s)).  RADIOLOGY STUDIES/RESULTS: CT ABDOMEN PELVIS WO CONTRAST  Result Date: 02/07/2021 CLINICAL DATA:  Abdominal distension EXAM: CT ABDOMEN AND PELVIS WITHOUT CONTRAST TECHNIQUE: Multidetector CT imaging of the abdomen and pelvis was performed following the standard protocol without IV contrast. COMPARISON:  CT abdomen and pelvis dated October 27, 2020 FINDINGS: Lower chest: Normal heart size. No pericardial effusion. Coronary artery calcifications. Mild bibasilar atelectasis. No acute abnormality. Hepatobiliary: No focal liver abnormality. Cholelithiasis with possible mild gallbladder wall thickening. No biliary ductal dilation. Pancreas: Unremarkable. No pancreatic  ductal dilatation or surrounding inflammatory changes. Spleen: Normal in size without focal abnormality. Adrenals/Urinary Tract: Adrenal glands are unremarkable. Atrophic bilateral kidneys no hydronephrosis. Punctate nonobstructing stones of the left kidney. Bladder is unremarkable. Stomach/Bowel: Stomach is within normal limits. Appendix is surgically absent. No evidence of bowel wall thickening, distention, or inflammatory changes. Vascular/Lymphatic: Aortic atherosclerosis. No enlarged abdominal or pelvic lymph nodes. Reproductive: Prostate is unremarkable. Other: No abdominal wall hernia or abnormality. No abdominopelvic ascites. Musculoskeletal: No acute or significant osseous findings. IMPRESSION: Cholelithiasis with possible mild gallbladder wall thickening. Correlate for symptoms of right upper quadrant pain and consider further evaluation with gallbladder ultrasound. Otherwise, no acute findings in the abdomen or pelvis. Aortic aneurysm NOS (ICD10-I71.9). Electronically Signed   By: Yetta Glassman M.D.   On: 02/07/2021 13:38   DG Chest 2 View  Result Date: 02/07/2021 CLINICAL DATA:  Weakness, epigastric pain EXAM: CHEST - 2 VIEW COMPARISON:  01/06/2021 FINDINGS: Unchanged cardiac and mediastinal contours, with redemonstrated cardiac enlargement and aortic atherosclerosis. Low lung volumes. Mild pulmonary interstitial thickening. No focal pulmonary opacity. No pleural effusion or pneumothorax. No acute osseous abnormality. IMPRESSION: Cardiomegaly and mild pulmonary interstitial thickening, which could be related to chronic bronchitis or pulmonary venous congestion. Electronically Signed   By: Merilyn Baba M.D.   On: 02/07/2021 12:02   US Abdomen Limited RUQ (LIVER/GB)  Result Date: 02/07/2021 CLINICAL DATA:  Cholelithiasis. Question cholecystitis. Abdominal distension. EXAM: ULTRASOUND ABDOMEN LIMITED RIGHT UPPER QUADRANT COMPARISON:  CT abdomen pelvis 02/07/2021 FINDINGS: Gallbladder:  Calcified gallstones noted within the gallbladder lumen. Associated gallbladder wall thickening. No definite pericholecystic fluid; however, limited evaluation deep ended tray shin of the ultrasound waves. No sonographic Murphy sign noted by sonographer. Common bile duct: Diameter: 4 mm. Liver: No focal lesion identified. Slightly heterogeneous hepatic parenchyma which may be due to ultrasound technique. Otherwise normal in parenchymal echogenicity. Portal vein is patent on color Doppler imaging with normal direction of blood flow towards the liver. Other: None. IMPRESSION: 1. Cholelithiasis with associated nonspecific gallbladder wall thickening. No pericholecystic fluid or sonographic Percell Miller sign reported. Gallbladder wall thickening can be seen in the setting of chronic liver disease. Correlate with clinical exam for acute cholecystitis. 2. Limited ultrasound to penetration of sonographic waves. Electronically Signed   By: Iven Finn M.D.   On: 02/07/2021 15:33     LOS: 1 day   Oren Binet, MD  [facility]    To contact the attending provider between 7A-7P or the covering provider during after hours 7P-7A, please log into the web site [email or web address] and access using universal [place] password for that web site. If you do not have the password, please call the hospital operator.  02/08/2021, 9:56 AM

## 2021-02-08 NOTE — Progress Notes (Signed)
Pt transported down to Endo via wheelchair.

## 2021-02-08 NOTE — Progress Notes (Signed)
[facility] Progress Note   60 y.o. male  ESRD TTS, PAD, alcohol use, history of otitis media, history of cocaine use who presents with worsening weakness.  He was only on  dialysis  for  2 hours until he requested to get off. Outpt  hgb of 7 and melena since his recent discharge. Has known PUD. Poor historian.  Assessment/ Plan:   1)  ESRD: -Outpatient orders: [place], TTS, 4 hours, Nipro, 450/500, 2K, 2 Cal, EDW 129 kg, aVF LUE, Hectorol 3 MCG q. treatment, Venofer 50 mg q. weekly, korsuva 1.2 mL q. treatment, Mircera 225 mcg every 2 weeks, sodium thiosulfate 25 g q. treatment.  No heparin. -Next HD on Sat 10/15. Unfortunately, we do not carry Nipro dialyzers here   2) Volume/ hypertension: EDW 129kg. Attempt to achieve EDW as tolerated   3) Anemia of Chronic Kidney Disease   GIB/melena: Hemoglobin 7's after 1 U PRBC 10/13. Currently receiving mircera 254mcg, last dose 10/4-not due for ESA yet. GI on board, repeat EGD today, on PPI BID, transfuse PRN for hgb<7    4) Secondary Hyperparathyroidism/Hyperphosphatemia: resume home binders and hectorol; will recheck phos in AM.   5)  Calciphylaxis -continue with Na thiosulfate here   6) Vascular access:LUE AVF +b/t   7) Additional recommendations: - Dose all meds for creatinine clearance < 10 ml/min  - Unless absolutely necessary, no MRIs with gadolinium.  - No blood pressure measurements in lt arm. - If blood transfusion is requested during hemodialysis sessions, please alert [place] prior to the session.   Subjective:   1U PRBC yest, 2 BM this AM (brown), mild SOB   Objective:   BP (!) 119/43 (BP Location: Right Wrist)   Pulse 78   Temp 98 F (36.7 C) (Oral)   Resp 13   SpO2 93%   Intake/Output Summary (Last 24 hours) at 02/08/2021 6301 Last data filed at 02/07/2021 1836 Gross per 24 hour  Intake 315 ml  Output --  Net 315 ml   Weight change:   Physical Exam: General: well-appearing, no acute distress HEENT:  anicteric sclera CV: normal rate, no murmurs, no edema Lungs: bilateral chest rise, normal wob Abd: obese, soft, non-tender, non-distended Ext: +edema b/l LE's Neuro: awake, alert Dialysis access: LUE BCF +b/t  Imaging: CT ABDOMEN PELVIS WO CONTRAST  Result Date: 02/07/2021 CLINICAL DATA:  Abdominal distension EXAM: CT ABDOMEN AND PELVIS WITHOUT CONTRAST TECHNIQUE: Multidetector CT imaging of the abdomen and pelvis was performed following the standard protocol without IV contrast. COMPARISON:  CT abdomen and pelvis dated October 27, 2020 FINDINGS: Lower chest: Normal heart size. No pericardial effusion. Coronary artery calcifications. Mild bibasilar atelectasis. No acute abnormality. Hepatobiliary: No focal liver abnormality. Cholelithiasis with possible mild gallbladder wall thickening. No biliary ductal dilation. Pancreas: Unremarkable. No pancreatic ductal dilatation or surrounding inflammatory changes. Spleen: Normal in size without focal abnormality. Adrenals/Urinary Tract: Adrenal glands are unremarkable. Atrophic bilateral kidneys no hydronephrosis. Punctate nonobstructing stones of the left kidney. Bladder is unremarkable. Stomach/Bowel: Stomach is within normal limits. Appendix is surgically absent. No evidence of bowel wall thickening, distention, or inflammatory changes. Vascular/Lymphatic: Aortic atherosclerosis. No enlarged abdominal or pelvic lymph nodes. Reproductive: Prostate is unremarkable. Other: No abdominal wall hernia or abnormality. No abdominopelvic ascites. Musculoskeletal: No acute or significant osseous findings. IMPRESSION: Cholelithiasis with possible mild gallbladder wall thickening. Correlate for symptoms of right upper quadrant pain and consider further evaluation with gallbladder ultrasound. Otherwise, no acute findings in the abdomen or pelvis. Aortic aneurysm  NOS (ICD10-I71.9). Electronically Signed   By: Yetta Glassman M.D.   On: 02/07/2021 13:38   DG Chest 2  View  Result Date: 02/07/2021 CLINICAL DATA:  Weakness, epigastric pain EXAM: CHEST - 2 VIEW COMPARISON:  01/06/2021 FINDINGS: Unchanged cardiac and mediastinal contours, with redemonstrated cardiac enlargement and aortic atherosclerosis. Low lung volumes. Mild pulmonary interstitial thickening. No focal pulmonary opacity. No pleural effusion or pneumothorax. No acute osseous abnormality. IMPRESSION: Cardiomegaly and mild pulmonary interstitial thickening, which could be related to chronic bronchitis or pulmonary venous congestion. Electronically Signed   By: Merilyn Baba M.D.   On: 02/07/2021 12:02   US Abdomen Limited RUQ (LIVER/GB)  Result Date: 02/07/2021 CLINICAL DATA:  Cholelithiasis. Question cholecystitis. Abdominal distension. EXAM: ULTRASOUND ABDOMEN LIMITED RIGHT UPPER QUADRANT COMPARISON:  CT abdomen pelvis 02/07/2021 FINDINGS: Gallbladder: Calcified gallstones noted within the gallbladder lumen. Associated gallbladder wall thickening. No definite pericholecystic fluid; however, limited evaluation deep ended tray shin of the ultrasound waves. No sonographic Murphy sign noted by sonographer. Common bile duct: Diameter: 4 mm. Liver: No focal lesion identified. Slightly heterogeneous hepatic parenchyma which may be due to ultrasound technique. Otherwise normal in parenchymal echogenicity. Portal vein is patent on color Doppler imaging with normal direction of blood flow towards the liver. Other: None. IMPRESSION: 1. Cholelithiasis with associated nonspecific gallbladder wall thickening. No pericholecystic fluid or sonographic Percell Miller sign reported. Gallbladder wall thickening can be seen in the setting of chronic liver disease. Correlate with clinical exam for acute cholecystitis. 2. Limited ultrasound to penetration of sonographic waves. Electronically Signed   By: Iven Finn M.D.   On: 02/07/2021 15:33    Labs: BMET Recent Labs  Lab 02/07/21 1103 02/08/21 0206  NA 137 136  K 3.9 4.1   CL 94* 93*  CO2 30 30  GLUCOSE 252* 113*  BUN 21 29*  CREATININE 6.53* 8.22*  CALCIUM 9.1 9.5   CBC Recent Labs  Lab 02/07/21 1103 02/07/21 2043 02/08/21 0206  WBC 6.2 5.6 5.8  NEUTROABS 4.0  --  2.9  HGB 7.0* 7.9* 7.8*  HCT 22.9* 24.1* 24.3*  MCV 117.4* 110.6* 110.5*  PLT 140* 125* 126*    Medications:     amLODipine  5 mg Oral Daily   calcium acetate  1,334 mg Oral TID PC & HS   cloNIDine  0.1 mg Oral BID   doxepin  10 mg Oral QHS   [START ON 02/09/2021] doxercalciferol  3 mcg Intravenous Q T,Th,Sa-HD   folic acid  1 mg Oral Daily   gabapentin  100 mg Oral QHS   gabapentin  300 mg Oral TID   multivitamin with minerals  1 tablet Oral Daily   nicotine  21 mg Transdermal Daily   [START ON 02/11/2021] pantoprazole  40 mg Intravenous Q12H   sodium chloride flush  3 mL Intravenous Q12H   sucralfate  1 g Oral Daily   thiamine  100 mg Oral Daily   Or   thiamine  100 mg Intravenous Daily      Otelia Santee, MD 02/08/2021, 7:28 AM

## 2021-02-08 NOTE — Interval H&P Note (Signed)
History and Physical Interval Note:  02/08/2021 12:42 PM  Daniel [name]  has presented today for surgery, with the diagnosis of Anemia.  The various methods of treatment have been discussed with the patient and family. After consideration of risks, benefits and other options for treatment, the patient has consented to  Procedure(s): ESOPHAGOGASTRODUODENOSCOPY (EGD) WITH PROPOFOL (N/A) as a surgical intervention.  The patient's history has been reviewed, patient examined, no change in status, stable for surgery.  I have reviewed the patient's chart and labs.  Questions were answered to the patient's satisfaction.     Sharyn Creamer

## 2021-02-08 NOTE — Transfer of Care (Signed)
Immediate Anesthesia Transfer of Care Note  Patient: Daniel [name]  Procedure(s) Performed: ESOPHAGOGASTRODUODENOSCOPY (EGD) WITH PROPOFOL BIOPSY  Patient Location: PACU  Anesthesia Type:MAC  Level of Consciousness: awake  Airway & Oxygen Therapy: Patient Spontanous Breathing and Patient connected to nasal cannula oxygen  Post-op Assessment: Report given to RN and Post -op Vital signs reviewed and stable  Post vital signs: stable  Last Vitals:  Vitals Value Taken Time  BP 97/75 02/08/21 1335  Temp    Pulse 64 02/08/21 1336  Resp 11 02/08/21 1336  SpO2 100 % 02/08/21 1336  Vitals shown include unvalidated device data.  Last Pain:  Vitals:   02/08/21 1212  TempSrc:   PainSc: 0-No pain         Complications: No notable events documented.

## 2021-02-08 NOTE — Anesthesia Preprocedure Evaluation (Signed)
Anesthesia Evaluation  Patient identified by MRN, date of birth, ID band Patient awake    Reviewed: Allergy & Precautions, NPO status , Patient's Chart, lab work & pertinent test results  Airway Mallampati: II  TM Distance: >3 FB Neck ROM: Full    Dental  (+) Teeth Intact   Pulmonary neg pulmonary ROS, Current Smoker and Patient abstained from smoking.,    Pulmonary exam normal        Cardiovascular hypertension, Pt. on medications  Rhythm:Regular Rate:Normal     Neuro/Psych negative neurological ROS  negative psych ROS   GI/Hepatic PUD, GERD  Medicated,(+)     substance abuse  alcohol use and cocaine use, Duodenal ulcer   Endo/Other  diabetes, Type 2  Renal/GU ESRF and DialysisRenal disease  negative genitourinary   Musculoskeletal negative musculoskeletal ROS (+)   Abdominal (+)  Abdomen: soft.    Peds  Hematology  (+) anemia ,   Anesthesia Other Findings   Reproductive/Obstetrics                             Anesthesia Physical  Anesthesia Plan  ASA: 3  Anesthesia Plan: MAC   Post-op Pain Management:    Induction: Intravenous  PONV Risk Score and Plan: 0 and Propofol infusion and Treatment may vary due to age or medical condition  Airway Management Planned: Simple Face Mask, Natural Airway and Nasal Cannula  Additional Equipment: None  Intra-op Plan:   Post-operative Plan:   Informed Consent: I have reviewed the patients History and Physical, chart, labs and discussed the procedure including the risks, benefits and alternatives for the proposed anesthesia with the patient or authorized representative who has indicated his/her understanding and acceptance.     Dental advisory given  Plan Discussed with: CRNA  Anesthesia Plan Comments: (Lab Results      Component                Value               Date                      WBC                      5.3                  01/11/2021                HGB                      8.5 (L)             01/11/2021                HCT                      26.6 (L)            01/11/2021                MCV                      112.1 (H)           01/11/2021                PLT  111 (L)             01/11/2021           Lab Results      Component                Value               Date                      NA                       139                 01/11/2021                K                        4.7                 01/11/2021                CO2                      30                  01/11/2021                GLUCOSE                  96                  01/11/2021                BUN                      26 (H)              01/11/2021                CREATININE               7.29 (H)            01/11/2021                CALCIUM                  8.5 (L)             01/11/2021                GFRNONAA                 8 (L)               01/11/2021                GFRAA                    7 (L)               12/28/2019          )        Anesthesia Quick Evaluation

## 2021-02-08 NOTE — Plan of Care (Signed)

## 2021-02-08 NOTE — Anesthesia Postprocedure Evaluation (Signed)
Anesthesia Post Note  Patient: Daniel [name]  Procedure(s) Performed: ESOPHAGOGASTRODUODENOSCOPY (EGD) WITH PROPOFOL BIOPSY     Patient location during evaluation: PACU Anesthesia Type: MAC Level of consciousness: awake and alert Pain management: pain level controlled Vital Signs Assessment: post-procedure vital signs reviewed and stable Respiratory status: spontaneous breathing, nonlabored ventilation, respiratory function stable and patient connected to nasal cannula oxygen Cardiovascular status: stable and blood pressure returned to baseline Postop Assessment: no apparent nausea or vomiting Anesthetic complications: no   No notable events documented.  Last Vitals:  Vitals:   02/08/21 1404 02/08/21 1410  BP: (!) 126/36 (!) 120/38  Pulse: 65 65  Resp: 13 15  Temp:    SpO2: 100% 100%    Last Pain:  Vitals:   02/08/21 1410  TempSrc:   PainSc: 0-No pain                 Effie Berkshire

## 2021-02-09 DIAGNOSIS — K922 Gastrointestinal hemorrhage, unspecified: Secondary | ICD-10-CM | POA: Diagnosis not present

## 2021-02-09 DIAGNOSIS — D649 Anemia, unspecified: Secondary | ICD-10-CM | POA: Diagnosis not present

## 2021-02-09 DIAGNOSIS — N186 End stage renal disease: Secondary | ICD-10-CM | POA: Diagnosis not present

## 2021-02-09 DIAGNOSIS — I10 Essential (primary) hypertension: Secondary | ICD-10-CM | POA: Diagnosis not present

## 2021-02-09 LAB — CBC
HCT: 24.9 % — ABNORMAL LOW (ref 39.0–52.0)
Hemoglobin: 8.1 g/dL — ABNORMAL LOW (ref 13.0–17.0)
MCH: 35.7 pg — ABNORMAL HIGH (ref 26.0–34.0)
MCHC: 32.5 g/dL (ref 30.0–36.0)
MCV: 109.7 fL — ABNORMAL HIGH (ref 80.0–100.0)
Platelets: 122 10*3/uL — ABNORMAL LOW (ref 150–400)
RBC: 2.27 MIL/uL — ABNORMAL LOW (ref 4.22–5.81)
RDW: 19.6 % — ABNORMAL HIGH (ref 11.5–15.5)
WBC: 5.5 10*3/uL (ref 4.0–10.5)
nRBC: 0.9 % — ABNORMAL HIGH (ref 0.0–0.2)

## 2021-02-09 LAB — HEPATITIS B SURFACE ANTIBODY,QUALITATIVE: Hep B S Ab: REACTIVE — AB

## 2021-02-09 LAB — HEPATITIS B CORE ANTIBODY, TOTAL: Hep B Core Total Ab: REACTIVE — AB

## 2021-02-09 MED ORDER — PANTOPRAZOLE SODIUM 40 MG PO TBEC: 40.0000 mg | DELAYED_RELEASE_TABLET | Freq: Two times a day (BID) | ORAL | 2 refills | Status: DC

## 2021-02-09 NOTE — Progress Notes (Signed)
Daniel [name] Progress Note   60 y.o. male  ESRD TTS, PAD, alcohol use, history of otitis media, history of cocaine use who presents with worsening weakness.  He was only on  dialysis  for  2 hours until he requested to get off. Outpt  hgb of 7 and melena since his recent discharge. Has known PUD. Poor historian.  Assessment/ Plan:   1)  ESRD: -Outpatient orders: [place], TTS, 4 hours, Nipro, 450/500, 2K, 2 Cal, EDW 129 kg, aVF LUE, Hectorol 3 MCG q. treatment, Venofer 50 mg q. weekly, korsuva 1.2 mL q. treatment, Mircera 225 mcg every 2 weeks, sodium thiosulfate 25 g q. treatment.  No heparin.  -Next HD today on Sat 10/15 and we will try to get him on 1st shift to facilitate d/c. Unfortunately, we do not carry Nipro dialyzers here   2) Volume/ hypertension: EDW 129kg. Attempt to achieve EDW as tolerated   3) Anemia of Chronic Kidney Disease   GIB/melena: Hemoglobin 7's after 1 U PRBC 10/13. Currently receiving mircera 222mcg, last dose 10/4-not due for ESA yet. GI on board, repeat EGD 10/14 -> [place], inflammation in stomach + bx, gastric ulcer w/ NL esophagus + prior gastric ulcer improved + H.Pylori + bx pending  ->  PPI BID for 6 weeks, transfuse PRN for hgb<7    4) Secondary Hyperparathyroidism/Hyperphosphatemia: resume home binders and hectorol; will recheck phos in AM.   5)  Calciphylaxis -continue with Na thiosulfate here   6) Vascular access:LUE AVF +b/t   7) Additional recommendations: - Dose all meds for creatinine clearance < 10 ml/min  - Unless absolutely necessary, no MRIs with gadolinium.  - No blood pressure measurements in lt arm. - If blood transfusion is requested during hemodialysis sessions, please alert [place] prior to the session.   Subjective:   1U PRBC 10/13, denies SOB S/p EGD on `10/14; denies any loose stool, melena, or BRBPR   Objective:   BP 139/60 (BP Location: Right Leg)   Pulse 79   Temp 97.9 F (36.6 C) (Oral)   Resp 19   Ht 5\' 11"   (1.803 m)   Wt 117.9 kg   SpO2 96%   BMI 36.26 kg/m   Intake/Output Summary (Last 24 hours) at 02/09/2021 0755 Last data filed at 02/08/2021 2350 Gross per 24 hour  Intake 738.55 ml  Output --  Net 738.55 ml   Weight change:   Physical Exam: General: well-appearing, no acute distress HEENT: anicteric sclera CV: normal rate, no murmurs, no edema Lungs: bilateral chest rise, normal wob Abd: obese, soft, non-tender, non-distended Ext: +edema b/l LE's Neuro: awake, alert Dialysis access: LUE BCF +b/t  Imaging: CT ABDOMEN PELVIS WO CONTRAST  Result Date: 02/07/2021 CLINICAL DATA:  Abdominal distension EXAM: CT ABDOMEN AND PELVIS WITHOUT CONTRAST TECHNIQUE: Multidetector CT imaging of the abdomen and pelvis was performed following the standard protocol without IV contrast. COMPARISON:  CT abdomen and pelvis dated October 27, 2020 FINDINGS: Lower chest: Normal heart size. No pericardial effusion. Coronary artery calcifications. Mild bibasilar atelectasis. No acute abnormality. Hepatobiliary: No focal liver abnormality. Cholelithiasis with possible mild gallbladder wall thickening. No biliary ductal dilation. Pancreas: Unremarkable. No pancreatic ductal dilatation or surrounding inflammatory changes. Spleen: Normal in size without focal abnormality. Adrenals/Urinary Tract: Adrenal glands are unremarkable. Atrophic bilateral kidneys no hydronephrosis. Punctate nonobstructing stones of the left kidney. Bladder is unremarkable. Stomach/Bowel: Stomach is within normal limits. Appendix is surgically absent. No evidence of bowel wall thickening, distention, or inflammatory changes. Vascular/Lymphatic:  Aortic atherosclerosis. No enlarged abdominal or pelvic lymph nodes. Reproductive: Prostate is unremarkable. Other: No abdominal wall hernia or abnormality. No abdominopelvic ascites. Musculoskeletal: No acute or significant osseous findings. IMPRESSION: Cholelithiasis with possible mild gallbladder wall  thickening. Correlate for symptoms of right upper quadrant pain and consider further evaluation with gallbladder ultrasound. Otherwise, no acute findings in the abdomen or pelvis. Aortic aneurysm NOS (ICD10-I71.9). Electronically Signed   By: Yetta Glassman M.D.   On: 02/07/2021 13:38   DG Chest 2 View  Result Date: 02/07/2021 CLINICAL DATA:  Weakness, epigastric pain EXAM: CHEST - 2 VIEW COMPARISON:  01/06/2021 FINDINGS: Unchanged cardiac and mediastinal contours, with redemonstrated cardiac enlargement and aortic atherosclerosis. Low lung volumes. Mild pulmonary interstitial thickening. No focal pulmonary opacity. No pleural effusion or pneumothorax. No acute osseous abnormality. IMPRESSION: Cardiomegaly and mild pulmonary interstitial thickening, which could be related to chronic bronchitis or pulmonary venous congestion. Electronically Signed   By: Merilyn Baba M.D.   On: 02/07/2021 12:02   US Abdomen Limited RUQ (LIVER/GB)  Result Date: 02/07/2021 CLINICAL DATA:  Cholelithiasis. Question cholecystitis. Abdominal distension. EXAM: ULTRASOUND ABDOMEN LIMITED RIGHT UPPER QUADRANT COMPARISON:  CT abdomen pelvis 02/07/2021 FINDINGS: Gallbladder: Calcified gallstones noted within the gallbladder lumen. Associated gallbladder wall thickening. No definite pericholecystic fluid; however, limited evaluation deep ended tray shin of the ultrasound waves. No sonographic Murphy sign noted by sonographer. Common bile duct: Diameter: 4 mm. Liver: No focal lesion identified. Slightly heterogeneous hepatic parenchyma which may be due to ultrasound technique. Otherwise normal in parenchymal echogenicity. Portal vein is patent on color Doppler imaging with normal direction of blood flow towards the liver. Other: None. IMPRESSION: 1. Cholelithiasis with associated nonspecific gallbladder wall thickening. No pericholecystic fluid or sonographic Percell Miller sign reported. Gallbladder wall thickening can be seen in the setting  of chronic liver disease. Correlate with clinical exam for acute cholecystitis. 2. Limited ultrasound to penetration of sonographic waves. Electronically Signed   By: Iven Finn M.D.   On: 02/07/2021 15:33    Labs: BMET Recent Labs  Lab 02/07/21 1103 02/08/21 0206  NA 137 136  K 3.9 4.1  CL 94* 93*  CO2 30 30  GLUCOSE 252* 113*  BUN 21 29*  CREATININE 6.53* 8.22*  CALCIUM 9.1 9.5   CBC Recent Labs  Lab 02/07/21 1103 02/07/21 2043 02/08/21 0206  WBC 6.2 5.6 5.8  NEUTROABS 4.0  --  2.9  HGB 7.0* 7.9* 7.8*  HCT 22.9* 24.1* 24.3*  MCV 117.4* 110.6* 110.5*  PLT 140* 125* 126*    Medications:     amLODipine  5 mg Oral Daily   calcium acetate  1,334 mg Oral TID PC & HS   Chlorhexidine Gluconate Cloth  6 each Topical Q0600   cloNIDine  0.1 mg Oral BID   doxepin  10 mg Oral QHS   doxercalciferol  3 mcg Intravenous Q T,Th,Sa-HD   folic acid  1 mg Oral Daily   gabapentin  100 mg Oral QHS   gabapentin  300 mg Oral TID   multivitamin with minerals  1 tablet Oral Daily   nicotine  21 mg Transdermal Daily   [START ON 02/11/2021] pantoprazole  40 mg Intravenous Q12H   sodium chloride flush  3 mL Intravenous Q12H   sucralfate  1 g Oral Daily   thiamine  100 mg Oral Daily   Or   thiamine  100 mg Intravenous Daily      Otelia Santee, MD 02/09/2021, 7:55 AM

## 2021-02-09 NOTE — Progress Notes (Signed)
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

## 2021-02-09 NOTE — Discharge Summary (Signed)
PATIENT DETAILS Name: Daniel [name] Age: 60 y.o. Sex: male Date of Birth: [birth year] MRN: [id number]. Admitting Physician: Jonetta Osgood, MD VOH:[id number], Milford Cage, NP  Admit Date: 02/07/2021 Discharge date: 02/09/2021  Recommendations for Outpatient Follow-up:  Follow up with PCP in 1-2 weeks Please obtain CMP/CBC in one week Please ensure follow-up with nephrology/hemodialysis clinic. Needs continued counseling to completely stop alcohol use to allow gastritis/gastric ulcer to heal.  Admitted From:  Home  Disposition: [place]: No  Equipment/Devices: None  Discharge Condition: Stable  CODE STATUS: FULL CODE  Diet recommendation:  Diet Order             Diet - low sodium heart healthy           Diet renal with fluid restriction Fluid restriction: 1200 mL Fluid; Room service appropriate? Yes; Fluid consistency: Thin  Diet effective now                    Brief Summary: Patient is a 60 y.o. male with history of recent hospitalization for upper GI bleeding-from bleeding peptic ulcer disease (most recent EGD on 9/17)-presented with weakness and worsening anemia along with melanotic stools-found to have upper GI bleeding and acute blood loss anemia.  See below for further details.  Brief Hospital Course: Recurrent upper GI bleeding with acute blood loss anemia: Admitted-started on PPI-required 1 unit of PRBC.  Underwent EGD on 10/15-which showed bleeding from gastritis, prior gastric ulcer was healing.  Recommendations from GI are to stop further alcohol use-continue PPI twice daily for 8 weeks.  GI recommending a second look endoscopy in 8 weeks.  Hemoglobin has been stable-no further bleeding-patient claims to be passing brown-colored stools.  ESRD on HD TTS: Evaluated by nephrology-underwent HD on 10/15-resume usual HD schedule in the outpatient setting.   History of calciphylaxis: Continue sodium thiosulfate   Multifactorial anemia:  Probably has normocytic anemia due to ESRD at baseline-worsened by ongoing GI bleeding.  CBC now stable-ill defer need for Aranesp/IV iron to nephrology.   Epigastric pain: Likely due to peptic ulcer disease-although lipase is mildly elevated-does not have features of pancreatitis on CT abdomen.  Pain has resolved with PPI.   HTN: BP stable with clonidine/amlodipine    DM-2: Reportedly diet controlled-watch closely during this hospitalization.   Peripheral neuropathy: Continue Neurontin   EtOH abuse: Claims he has cut back significantly and drinks only on the weekends-but then acknowledges drinking 1 can of beer yesterday.  Unclear how truthful he is-plan is to monitor closely-managed with Ativan per [facility] protocol-however no withdrawal symptoms during this hospitalization.  He was counseled extensively regarding importance of completely abstaining from further alcohol use.   Generalized weakness/debility: Suspect due to symptomatic anemia-should improve with transfusion-treatment of underlying GI bleeding.  Evaluated by PT-significantly better posttransfusion-claims he ambulated in the hallway and wants to go home today.   Obesity: Estimated body mass index is 40.8 kg/m as calculated from the following:   Height as of this encounter: 5' 11"  (1.803 m).   Weight as of this encounter: 132.7 kg.      Procedures 10/14>> EGD  Discharge Diagnoses:  Active Problems:   UGI bleed   Discharge Instructions:  Activity:  As tolerated    Discharge Instructions     Diet - low sodium heart healthy   Complete by: As directed    Discharge instructions   Complete by: As directed    Follow with Primary MD  [name]  P, NP in 1-2 weeks  Please get a complete blood count and chemistry panel checked by your Primary MD at your next visit, and again as instructed by your Primary MD.  Get Medicines reviewed and adjusted: Please take all your medications with you for your next visit with  your Primary MD  Laboratory/radiological data: Please request your Primary MD to go over all hospital tests and procedure/radiological results at the follow up, please ask your Primary MD to get all Hospital records sent to his/her office.  In some cases, they will be blood work, cultures and biopsy results pending at the time of your discharge. Please request that your primary care M.D. follows up on these results.  Also Note the following: If you experience worsening of your admission symptoms, develop shortness of breath, life threatening emergency, suicidal or homicidal thoughts you must seek medical attention immediately by calling 911 or calling your MD immediately  if symptoms less severe.  You must read complete instructions/literature along with all the possible adverse reactions/side effects for all the Medicines you take and that have been prescribed to you. Take any new Medicines after you have completely understood and accpet all the possible adverse reactions/side effects.   Do not drive when taking Pain medications or sleeping medications (Benzodaizepines)  Do not take more than prescribed Pain, Sleep and Anxiety Medications. It is not advisable to combine anxiety,sleep and pain medications without talking with your primary care practitioner  Special Instructions: If you have smoked or chewed Tobacco  in the last 2 yrs please stop smoking, stop any regular Alcohol  and or any Recreational drug use.  Wear Seat belts while driving.  Please note: You were cared for by a hospitalist during your hospital stay. Once you are discharged, your primary care physician will handle any further medical issues. Please note that NO REFILLS for any discharge medications will be authorized once you are discharged, as it is imperative that you return to your primary care physician (or establish a relationship with a primary care physician if you do not have one) for your post hospital discharge  needs so that they can reassess your need for medications and monitor your lab values.   Follow-up with your hemodialysis clinic at your usual schedule.  Follow-up with gastroenterology clinic-they will call you for a follow-up appointment  Refrain from smoking and drinking alcohol-as this will prevent ulcer healing-and put you at increased risk of recurrent GI bleeding.   Increase activity slowly   Complete by: As directed    No dressing needed   Complete by: As directed       Allergies as of 02/09/2021       Reactions   Dilaudid [hydromorphone Hcl] Itching, Other (See Comments)   Pt reports itchiness after IM injection         Medication List     STOP taking these medications    bacitracin ointment   magic mouthwash (nystatin, lidocaine, diphenhydrAMINE, alum & mag hydroxide) suspension   nitroGLYCERIN 0.2 mg/hr patch Commonly known as: NITRODUR - Dosed in mg/24 hr       TAKE these medications    amLODipine 10 MG tablet Commonly known as: NORVASC Take 10 mg by mouth in the morning and at bedtime.   blood glucose meter kit and supplies Kit Dispense based on patient and insurance preference. Use up to four times daily as directed. (FOR ICD-9 250.00, 250.01).   Calcium Acetate 667 MG Tabs Take 1,334 mg by mouth  4 (four) times daily.   cetirizine 10 MG tablet Commonly known as: ZyrTEC Allergy Take 1 tablet (10 mg total) by mouth daily as needed for allergies.   cloNIDine 0.2 MG tablet Commonly known as: CATAPRES Take  1 tablet twice daily . What changed:  how much to take how to take this when to take this   doxepin 10 MG capsule Commonly known as: SINEQUAN Take 10 mg by mouth at bedtime.   gabapentin 100 MG capsule Commonly known as: NEURONTIN Take 1 capsule (100 mg total) by mouth at bedtime.   gabapentin 300 MG capsule Commonly known as: NEURONTIN Take 300 mg by mouth in the morning, at noon, and at bedtime.   hydrOXYzine 25 MG  tablet Commonly known as: ATARAX/VISTARIL TAKE 1 TABLET BY MOUTH EVERY 8 HOURS AS NEEDED FOR ANXIETY OR  ITCHING What changed: See the new instructions.   lidocaine-prilocaine cream Commonly known as: EMLA APPLY SMALL AMOUNT TO ACCESS SITE (AVF) 1 TO 2 HOURS BEFORE DIALYSIS. COVER WITH OCCLUSIVE DRESSING (SARAN WRAP)   oxyCODONE-acetaminophen 5-325 MG tablet Commonly known as: PERCOCET/ROXICET Take 1 tablet by mouth every 8 (eight) hours as needed for severe pain.   pantoprazole 40 MG tablet Commonly known as: PROTONIX Take 1 tablet (40 mg total) by mouth 2 (two) times daily.   sucralfate 1 g tablet Commonly known as: CARAFATE Take 1 g by mouth daily.               Discharge Care Instructions  (From admission, onward)           Start     Ordered   02/09/21 0000  No dressing needed        02/09/21 1146            Follow-up Information     [name], NP. Schedule an appointment as soon as possible for a visit in 1 week(s).   Specialty: Internal Medicine Contact information: [place] [address] [facility] Follow up.   Specialty: Gastroenterology Why: Office will call with date/time, If you dont hear from them,please give them a call Contact information: [place] [address] [phone]        Hemodialysis clinic Follow up.   Why: Follow at your usual schedule.               Allergies  Allergen Reactions   Dilaudid [Hydromorphone Hcl] Itching and Other (See Comments)    Pt reports itchiness after IM injection       Consultations: GI, nephrology   Other Procedures/Studies: CT ABDOMEN PELVIS WO CONTRAST  Result Date: 02/07/2021 CLINICAL DATA:  Abdominal distension EXAM: CT ABDOMEN AND PELVIS WITHOUT CONTRAST TECHNIQUE: Multidetector CT imaging of the abdomen and pelvis was performed following the standard protocol without IV  contrast. COMPARISON:  CT abdomen and pelvis dated October 27, 2020 FINDINGS: Lower chest: Normal heart size. No pericardial effusion. Coronary artery calcifications. Mild bibasilar atelectasis. No acute abnormality. Hepatobiliary: No focal liver abnormality. Cholelithiasis with possible mild gallbladder wall thickening. No biliary ductal dilation. Pancreas: Unremarkable. No pancreatic ductal dilatation or surrounding inflammatory changes. Spleen: Normal in size without focal abnormality. Adrenals/Urinary Tract: Adrenal glands are unremarkable. Atrophic bilateral kidneys no hydronephrosis. Punctate nonobstructing stones of the left kidney. Bladder is unremarkable. Stomach/Bowel: Stomach is within normal limits. Appendix is surgically absent. No evidence of bowel wall thickening, distention, or inflammatory changes. Vascular/Lymphatic: Aortic atherosclerosis.  No enlarged abdominal or pelvic lymph nodes. Reproductive: Prostate is unremarkable. Other: No abdominal wall hernia or abnormality. No abdominopelvic ascites. Musculoskeletal: No acute or significant osseous findings. IMPRESSION: Cholelithiasis with possible mild gallbladder wall thickening. Correlate for symptoms of right upper quadrant pain and consider further evaluation with gallbladder ultrasound. Otherwise, no acute findings in the abdomen or pelvis. Aortic aneurysm NOS (ICD10-I71.9). Electronically Signed   By: Yetta Glassman M.D.   On: 02/07/2021 13:38   DG Chest 2 View  Result Date: 02/07/2021 CLINICAL DATA:  Weakness, epigastric pain EXAM: CHEST - 2 VIEW COMPARISON:  01/06/2021 FINDINGS: Unchanged cardiac and mediastinal contours, with redemonstrated cardiac enlargement and aortic atherosclerosis. Low lung volumes. Mild pulmonary interstitial thickening. No focal pulmonary opacity. No pleural effusion or pneumothorax. No acute osseous abnormality. IMPRESSION: Cardiomegaly and mild pulmonary interstitial thickening, which could be related to chronic  bronchitis or pulmonary venous congestion. Electronically Signed   By: Merilyn Baba M.D.   On: 02/07/2021 12:02   CT HEAD WO CONTRAST (5MM)  Result Date: 01/10/2021 CLINICAL DATA:  Altered mental status. EXAM: CT HEAD WITHOUT CONTRAST TECHNIQUE: Contiguous axial images were obtained from the base of the skull through the vertex without intravenous contrast. COMPARISON:  January 05, 2021. FINDINGS: Brain: No evidence of acute infarction, hemorrhage, hydrocephalus, extra-axial collection or mass lesion/mass effect. Vascular: No hyperdense vessel or unexpected calcification. Skull: Normal. Negative for fracture or focal lesion. Sinuses/Orbits: No acute finding. Other: None. IMPRESSION: No acute intracranial abnormality seen. Electronically Signed   By: Marijo Conception M.D.   On: 01/10/2021 15:17   US Abdomen Limited RUQ (LIVER/GB)  Result Date: 02/07/2021 CLINICAL DATA:  Cholelithiasis. Question cholecystitis. Abdominal distension. EXAM: ULTRASOUND ABDOMEN LIMITED RIGHT UPPER QUADRANT COMPARISON:  CT abdomen pelvis 02/07/2021 FINDINGS: Gallbladder: Calcified gallstones noted within the gallbladder lumen. Associated gallbladder wall thickening. No definite pericholecystic fluid; however, limited evaluation deep ended tray shin of the ultrasound waves. No sonographic Murphy sign noted by sonographer. Common bile duct: Diameter: 4 mm. Liver: No focal lesion identified. Slightly heterogeneous hepatic parenchyma which may be due to ultrasound technique. Otherwise normal in parenchymal echogenicity. Portal vein is patent on color Doppler imaging with normal direction of blood flow towards the liver. Other: None. IMPRESSION: 1. Cholelithiasis with associated nonspecific gallbladder wall thickening. No pericholecystic fluid or sonographic Percell Miller sign reported. Gallbladder wall thickening can be seen in the setting of chronic liver disease. Correlate with clinical exam for acute cholecystitis. 2. Limited ultrasound  to penetration of sonographic waves. Electronically Signed   By: Iven Finn M.D.   On: 02/07/2021 15:33     TODAY-DAY OF DISCHARGE:  Subjective:   Daniel [name] today has no headache,no chest abdominal pain,no new weakness tingling or numbness, feels much better wants to go home today.  Objective:   Blood pressure (!) 120/46, pulse 87, temperature 98.8 F (37.1 C), temperature source Oral, resp. rate 13, height 5' 11"  (1.803 m), weight 132.7 kg, SpO2 98 %.  Intake/Output Summary (Last 24 hours) at 02/09/2021 1146 Last data filed at 02/09/2021 0800 Gross per 24 hour  Intake 528.56 ml  Output --  Net 528.56 ml   Filed Weights   02/08/21 1212 02/09/21 0840  Weight: 117.9 kg 132.7 kg    Exam: Awake Alert, Oriented *3, No new F.N deficits, Normal affect [place].AT,PERRAL Supple Neck,No JVD, No cervical lymphadenopathy appriciated.  Symmetrical Chest wall movement, Good air movement bilaterally, CTAB RRR,No Gallops,Rubs or new Murmurs, No Parasternal Heave +ve B.Sounds, Abd Soft, Non tender,  No organomegaly appriciated, No rebound -guarding or rigidity. No Cyanosis, Clubbing or edema, No new Rash or bruise   PERTINENT RADIOLOGIC STUDIES: CT ABDOMEN PELVIS WO CONTRAST  Result Date: 02/07/2021 CLINICAL DATA:  Abdominal distension EXAM: CT ABDOMEN AND PELVIS WITHOUT CONTRAST TECHNIQUE: Multidetector CT imaging of the abdomen and pelvis was performed following the standard protocol without IV contrast. COMPARISON:  CT abdomen and pelvis dated October 27, 2020 FINDINGS: Lower chest: Normal heart size. No pericardial effusion. Coronary artery calcifications. Mild bibasilar atelectasis. No acute abnormality. Hepatobiliary: No focal liver abnormality. Cholelithiasis with possible mild gallbladder wall thickening. No biliary ductal dilation. Pancreas: Unremarkable. No pancreatic ductal dilatation or surrounding inflammatory changes. Spleen: Normal in size without focal abnormality.  Adrenals/Urinary Tract: Adrenal glands are unremarkable. Atrophic bilateral kidneys no hydronephrosis. Punctate nonobstructing stones of the left kidney. Bladder is unremarkable. Stomach/Bowel: Stomach is within normal limits. Appendix is surgically absent. No evidence of bowel wall thickening, distention, or inflammatory changes. Vascular/Lymphatic: Aortic atherosclerosis. No enlarged abdominal or pelvic lymph nodes. Reproductive: Prostate is unremarkable. Other: No abdominal wall hernia or abnormality. No abdominopelvic ascites. Musculoskeletal: No acute or significant osseous findings. IMPRESSION: Cholelithiasis with possible mild gallbladder wall thickening. Correlate for symptoms of right upper quadrant pain and consider further evaluation with gallbladder ultrasound. Otherwise, no acute findings in the abdomen or pelvis. Aortic aneurysm NOS (ICD10-I71.9). Electronically Signed   By: Yetta Glassman M.D.   On: 02/07/2021 13:38   US Abdomen Limited RUQ (LIVER/GB)  Result Date: 02/07/2021 CLINICAL DATA:  Cholelithiasis. Question cholecystitis. Abdominal distension. EXAM: ULTRASOUND ABDOMEN LIMITED RIGHT UPPER QUADRANT COMPARISON:  CT abdomen pelvis 02/07/2021 FINDINGS: Gallbladder: Calcified gallstones noted within the gallbladder lumen. Associated gallbladder wall thickening. No definite pericholecystic fluid; however, limited evaluation deep ended tray shin of the ultrasound waves. No sonographic Murphy sign noted by sonographer. Common bile duct: Diameter: 4 mm. Liver: No focal lesion identified. Slightly heterogeneous hepatic parenchyma which may be due to ultrasound technique. Otherwise normal in parenchymal echogenicity. Portal vein is patent on color Doppler imaging with normal direction of blood flow towards the liver. Other: None. IMPRESSION: 1. Cholelithiasis with associated nonspecific gallbladder wall thickening. No pericholecystic fluid or sonographic Percell Miller sign reported. Gallbladder wall  thickening can be seen in the setting of chronic liver disease. Correlate with clinical exam for acute cholecystitis. 2. Limited ultrasound to penetration of sonographic waves. Electronically Signed   By: Iven Finn M.D.   On: 02/07/2021 15:33     PERTINENT LAB RESULTS: CBC: Recent Labs    02/08/21 0206 02/09/21 0753  WBC 5.8 5.5  HGB 7.8* 8.1*  HCT 24.3* 24.9*  PLT 126* 122*   CMET CMP     Component Value Date/Time   NA 136 02/08/2021 0206   NA 138 08/22/2020 1141   K 4.1 02/08/2021 0206   CL 93 (L) 02/08/2021 0206   CO2 30 02/08/2021 0206   GLUCOSE 113 (H) 02/08/2021 0206   BUN 29 (H) 02/08/2021 0206   BUN 20 08/22/2020 1141   CREATININE 8.22 (H) 02/08/2021 0206   CALCIUM 9.5 02/08/2021 0206   CALCIUM 7.9 (L) 08/01/2019 0702   PROT 7.5 02/08/2021 0206   PROT 8.4 12/28/2019 1640   ALBUMIN 2.5 (L) 02/08/2021 0206   ALBUMIN 3.9 12/28/2019 1640   AST 40 02/08/2021 0206   ALT 17 02/08/2021 0206   ALKPHOS 88 02/08/2021 0206   BILITOT 1.0 02/08/2021 0206   BILITOT 0.5 12/28/2019 1640   GFRNONAA 7 (L) 02/08/2021 0206   GFRAA 7 (  L) 12/28/2019 1640    GFR Estimated Creatinine Clearance: 13.1 mL/min (A) (by C-G formula based on SCr of 8.22 mg/dL (H)). Recent Labs    02/07/21 1103  LIPASE 113*   No results for input(s): CKTOTAL, CKMB, CKMBINDEX, TROPONINI in the last 72 hours. Invalid input(s): POCBNP No results for input(s): DDIMER in the last 72 hours. No results for input(s): HGBA1C in the last 72 hours. No results for input(s): CHOL, HDL, LDLCALC, TRIG, CHOLHDL, LDLDIRECT in the last 72 hours. No results for input(s): TSH, T4TOTAL, T3FREE, THYROIDAB in the last 72 hours.  Invalid input(s): FREET3 No results for input(s): VITAMINB12, FOLATE, FERRITIN, TIBC, IRON, RETICCTPCT in the last 72 hours. Coags: No results for input(s): INR in the last 72 hours.  Invalid input(s): PT Microbiology: Recent Results (from the past 240 hour(s))  Resp Panel by RT-PCR  (Flu A&B, Covid) Nasopharyngeal Swab     Status: None   Collection Time: 02/08/21  8:56 AM   Specimen: Nasopharyngeal Swab; Nasopharyngeal(NP) swabs in vial transport medium  Result Value Ref Range Status   SARS Coronavirus 2 by RT PCR NEGATIVE NEGATIVE Final    Comment: (NOTE) SARS-CoV-2 target nucleic acids are NOT DETECTED.  The SARS-CoV-2 RNA is generally detectable in upper respiratory specimens during the acute phase of infection. The lowest concentration of SARS-CoV-2 viral copies this assay can detect is 138 copies/mL. A negative result does not preclude SARS-Cov-2 infection and should not be used as the sole basis for treatment or other patient management decisions. A negative result may occur with  improper specimen collection/handling, submission of specimen other than nasopharyngeal swab, presence of viral mutation(s) within the areas targeted by this assay, and inadequate number of viral copies(<138 copies/mL). A negative result must be combined with clinical observations, patient history, and epidemiological information. The expected result is Negative.  Fact Sheet for Patients:  EntrepreneurPulse.com.au  Fact Sheet for Healthcare Providers:  IncredibleEmployment.be  This test is no t yet approved or cleared by the [place] FDA and  has been authorized for detection and/or diagnosis of SARS-CoV-2 by FDA under an Emergency Use Authorization (EUA). This EUA will remain  in effect (meaning this test can be used) for the duration of the COVID-19 declaration under Section 564(b)(1) of the Act, 21 U.S.C.section 360bbb-3(b)(1), unless the authorization is terminated  or revoked sooner.       Influenza A by PCR NEGATIVE NEGATIVE Final   Influenza B by PCR NEGATIVE NEGATIVE Final    Comment: (NOTE) The Xpert Xpress SARS-CoV-2/FLU/RSV plus assay is intended as an aid in the diagnosis of influenza from Nasopharyngeal swab specimens  and should not be used as a sole basis for treatment. Nasal washings and aspirates are unacceptable for Xpert Xpress SARS-CoV-2/FLU/RSV testing.  Fact Sheet for Patients: EntrepreneurPulse.com.au  Fact Sheet for Healthcare Providers: IncredibleEmployment.be  This test is not yet approved or cleared by the [place] FDA and has been authorized for detection and/or diagnosis of SARS-CoV-2 by FDA under an Emergency Use Authorization (EUA). This EUA will remain in effect (meaning this test can be used) for the duration of the COVID-19 declaration under Section 564(b)(1) of the Act, 21 U.S.C. section 360bbb-3(b)(1), unless the authorization is terminated or revoked.  Performed at [facility], [place] [address] [place],  [address]     FURTHER DISCHARGE INSTRUCTIONS:  Get Medicines reviewed and adjusted: Please take all your medications with you for your next visit with your Primary MD  Laboratory/radiological data: Please request  your Primary MD to go over all hospital tests and procedure/radiological results at the follow up, please ask your Primary MD to get all Hospital records sent to his/her office.  In some cases, they will be blood work, cultures and biopsy results pending at the time of your discharge. Please request that your primary care M.D. goes through all the records of your hospital data and follows up on these results.  Also Note the following: If you experience worsening of your admission symptoms, develop shortness of breath, life threatening emergency, suicidal or homicidal thoughts you must seek medical attention immediately by calling 911 or calling your MD immediately  if symptoms less severe.  You must read complete instructions/literature along with all the possible adverse reactions/side effects for all the Medicines you take and that have been prescribed to you. Take any new Medicines after you have completely  understood and accpet all the possible adverse reactions/side effects.   Do not drive when taking Pain medications or sleeping medications (Benzodaizepines)  Do not take more than prescribed Pain, Sleep and Anxiety Medications. It is not advisable to combine anxiety,sleep and pain medications without talking with your primary care practitioner  Special Instructions: If you have smoked or chewed Tobacco  in the last 2 yrs please stop smoking, stop any regular Alcohol  and or any Recreational drug use.  Wear Seat belts while driving.  Please note: You were cared for by a hospitalist during your hospital stay. Once you are discharged, your primary care physician will handle any further medical issues. Please note that NO REFILLS for any discharge medications will be authorized once you are discharged, as it is imperative that you return to your primary care physician (or establish a relationship with a primary care physician if you do not have one) for your post hospital discharge needs so that they can reassess your need for medications and monitor your lab values.  Total Time spent coordinating discharge including counseling, education and face to face time equals 35 minutes   Signed: Alizay Bronkema 02/09/2021 11:46 AM

## 2021-02-09 NOTE — Evaluation (Signed)
Occupational Therapy Evaluation Patient Details Name: Daniel [name] MRN: [id number] DOB: 1961-04-28 Today's Date: 02/09/2021   History of Present Illness Pt adm 10/13 with upper GI bleed and acute blood loss anemia. Pt transfused 1 unit. PMH - ESRD on HD, calciphylaxis, HTN, etoh abuse, peripheral neuropathy, DM   Clinical Impression   Pt preparing to discharge following HD. Demonstrated ability to ambulate in room and perform ADL modified independently. No OT needs. Pt is declining DME for bathroom.      Recommendations for follow up therapy are one component of a multi-disciplinary discharge planning process, led by the attending physician.  Recommendations may be updated based on patient status, additional functional criteria and insurance authorization.   Follow Up Recommendations  No OT follow up    Equipment Recommendations  None recommended by OT    Recommendations for Other Services       Precautions / Restrictions Precautions Precautions: Fall      Mobility Bed Mobility Overal bed mobility: Modified Independent             General bed mobility comments: HOB up    Transfers Overall transfer level: Modified independent Equipment used: None             General transfer comment: slow to rise, wide base    Balance Overall balance assessment: Mild deficits observed, not formally tested                                         ADL either performed or assessed with clinical judgement   ADL Overall ADL's : Modified independent                                       General ADL Comments: increased time and effort with LB ADL, declining DME for showering     Vision Ability to See in Adequate Light: 0 Adequate Patient Visual Report: No change from baseline       Perception     Praxis      Pertinent Vitals/Pain Pain Assessment: No/denies pain     Hand Dominance Right   Extremity/Trunk Assessment Upper  Extremity Assessment Upper Extremity Assessment: Overall WFL for tasks assessed   Lower Extremity Assessment Lower Extremity Assessment: Defer to PT evaluation   Cervical / Trunk Assessment Cervical / Trunk Assessment: Other exceptions Cervical / Trunk Exceptions: obesity   Communication Communication Communication: No difficulties   Cognition Arousal/Alertness: Awake/alert Behavior During Therapy: Flat affect Overall Cognitive Status: Within Functional Limits for tasks assessed                                     General Comments       Exercises     Shoulder Instructions      Home Living Family/patient expects to be discharged to:: Private residence Living Arrangements: Other relatives Available Help at Discharge: Family;Other (Comment) Type of Home: Apartment Home Access: Level entry     Home Layout: Two level;Bed/bath upstairs Alternate Level Stairs-Number of Steps: flight Alternate Level Stairs-Rails: Right Bathroom Shower/Tub: Walk-in Psychologist, prison and probation services: Standard     Home Equipment: Walker - 2 wheels          Prior Functioning/Environment Level of  Independence: Independent        Comments: Drives himself to HD. Fatigues on stairs at home. Reports 2 falls per week        OT Problem List:        OT Treatment/Interventions:      OT Goals(Current goals can be found in the care plan section) Acute Rehab OT Goals Patient Stated Goal: go home  OT Frequency:     Barriers to D/C:            Co-evaluation              AM-PAC OT "6 Clicks" Daily Activity     Outcome Measure Help from another person eating meals?: None Help from another person taking care of personal grooming?: None Help from another person toileting, which includes using toliet, bedpan, or urinal?: None Help from another person bathing (including washing, rinsing, drying)?: None Help from another person to put on and taking off regular upper body  clothing?: None Help from another person to put on and taking off regular lower body clothing?: None 6 Click Score: 24   End of Session    Activity Tolerance: Patient tolerated treatment well Patient left: in bed;with call bell/phone within reach  OT Visit Diagnosis: Other abnormalities of gait and mobility (R26.89)                Time: 6468-0321 OT Time Calculation (min): 14 min Charges:  OT General Charges $OT Visit: 1 Visit OT Evaluation $OT Eval Low Complexity: 1 Low  Nestor Lewandowsky, OTR/L Acute Rehabilitation Services Pager: [phone] Office: [phone]   Daniel [name] 02/09/2021, 1:02 PM

## 2021-02-09 NOTE — Progress Notes (Signed)
Pt returned to unit from hd, alert and oriented, report received from [facility]ony, dialysis 3L removed, ran for 3 hours without concerns, fistula positive thrill and bruit, gauze dressing in place, no drainage noted.

## 2021-02-09 NOTE — Progress Notes (Signed)
OT Cancellation Note  Patient Details Name: Daniel [name] MRN: [id number] DOB: 1961/01/18   Cancelled Treatment:    Reason Eval/Treat Not Completed: Patient at procedure or test/ unavailable (HD)  [name] 02/09/2021, 8:52 AM Nestor Lewandowsky, OTR/L Acute Rehabilitation Services Pager: [phone] Office: [phone]

## 2021-02-10 ENCOUNTER — Telehealth (HOSPITAL_COMMUNITY): Payer: Self-pay | Admitting: Nephrology

## 2021-02-10 ENCOUNTER — Encounter (HOSPITAL_COMMUNITY): Payer: Self-pay | Admitting: Internal Medicine

## 2021-02-10 NOTE — Telephone Encounter (Signed)
Transition of care contact from inpatient facility  Date of discharge: 02/09/2021 Date of contact: 02/10/2021 Method: Phone Spoke to: Patient  Patient contacted to discuss transition of care from recent inpatient hospitalization. Patient was admitted to [facility] from 10/13 - 02/09/2021 with discharge diagnosis of acute GIB, symptomatic anemia.  Medication changes were reviewed. Has his PPI. Denies further bleeding. Tells me "my hands keep shaking and I dropped something." No dizziness, seizures. No nausea/vomiting and speech seems clear. Tells me hasn't been drinking since discharge. No alcohol withdrawal symptoms during hospitalization, but perhaps having some now? Hopefully if this is some mild withdrawal it will resolve over the next day or so. Other etiology would be gabapentin myoclonic jerks - on quite a large dose (300mg  AM, 300mg  mid-day, 400mg  QHS) - discussed can try to do that just once a day and see if helps.  Patient will follow up with his/her outpatient HD unit on: Tuesday.  Veneta Penton, PA-C [employer] Pager [phone]

## 2021-02-11 ENCOUNTER — Emergency Department (HOSPITAL_COMMUNITY): Payer: Medicare Other

## 2021-02-11 ENCOUNTER — Telehealth: Payer: Self-pay

## 2021-02-11 ENCOUNTER — Other Ambulatory Visit: Payer: Self-pay

## 2021-02-11 ENCOUNTER — Emergency Department (HOSPITAL_COMMUNITY)
Admission: EM | Admit: 2021-02-11 | Discharge: 2021-02-11 | Disposition: A | Discharge status: Home / Self Care | Payer: Medicare Other | Attending: Emergency Medicine | Admitting: Emergency Medicine

## 2021-02-11 DIAGNOSIS — Z8549 Personal history of malignant neoplasm of other male genital organs: Secondary | ICD-10-CM | POA: Diagnosis not present

## 2021-02-11 DIAGNOSIS — I120 Hypertensive chronic kidney disease with stage 5 chronic kidney disease or end stage renal disease: Secondary | ICD-10-CM | POA: Diagnosis not present

## 2021-02-11 DIAGNOSIS — Z992 Dependence on renal dialysis: Secondary | ICD-10-CM | POA: Insufficient documentation

## 2021-02-11 DIAGNOSIS — Z79899 Other long term (current) drug therapy: Secondary | ICD-10-CM | POA: Insufficient documentation

## 2021-02-11 DIAGNOSIS — W19XXXA Unspecified fall, initial encounter: Secondary | ICD-10-CM | POA: Diagnosis not present

## 2021-02-11 DIAGNOSIS — M25112 Fistula, left shoulder: Secondary | ICD-10-CM | POA: Diagnosis not present

## 2021-02-11 DIAGNOSIS — F17210 Nicotine dependence, cigarettes, uncomplicated: Secondary | ICD-10-CM | POA: Diagnosis not present

## 2021-02-11 DIAGNOSIS — N186 End stage renal disease: Secondary | ICD-10-CM | POA: Insufficient documentation

## 2021-02-11 DIAGNOSIS — M7989 Other specified soft tissue disorders: Secondary | ICD-10-CM | POA: Diagnosis present

## 2021-02-11 DIAGNOSIS — E1122 Type 2 diabetes mellitus with diabetic chronic kidney disease: Secondary | ICD-10-CM | POA: Insufficient documentation

## 2021-02-11 LAB — BASIC METABOLIC PANEL
Anion gap: 13 (ref 5–15)
BUN: 44 mg/dL — ABNORMAL HIGH (ref 8–23)
CO2: 25 mmol/L (ref 22–32)
Calcium: 9.9 mg/dL (ref 8.9–10.3)
Chloride: 95 mmol/L — ABNORMAL LOW (ref 98–111)
Creatinine, Ser: 10.47 mg/dL — ABNORMAL HIGH (ref 0.61–1.24)
GFR, Estimated: 5 mL/min — ABNORMAL LOW (ref >60)
Glucose, Bld: 135 mg/dL — ABNORMAL HIGH (ref 70–99)
Potassium: 4.8 mmol/L (ref 3.5–5.1)
Sodium: 133 mmol/L — ABNORMAL LOW (ref 135–145)

## 2021-02-11 LAB — SURGICAL PATHOLOGY

## 2021-02-11 LAB — CBC
HCT: 25.4 % — ABNORMAL LOW (ref 39.0–52.0)
Hemoglobin: 8.1 g/dL — ABNORMAL LOW (ref 13.0–17.0)
MCH: 35.5 pg — ABNORMAL HIGH (ref 26.0–34.0)
MCHC: 31.9 g/dL (ref 30.0–36.0)
MCV: 111.4 fL — ABNORMAL HIGH (ref 80.0–100.0)
Platelets: 129 10*3/uL — ABNORMAL LOW (ref 150–400)
RBC: 2.28 MIL/uL — ABNORMAL LOW (ref 4.22–5.81)
RDW: 18.6 % — ABNORMAL HIGH (ref 11.5–15.5)
WBC: 6.8 10*3/uL (ref 4.0–10.5)
nRBC: 0 % (ref 0.0–0.2)

## 2021-02-11 MED ORDER — HYDROCODONE-ACETAMINOPHEN 5-325 MG PO TABS
1.0000 | ORAL_TABLET | Freq: Once | ORAL | Status: AC
  Administered 2021-02-11: 1 via ORAL
  Filled 2021-02-11: qty 1

## 2021-02-11 MED ORDER — OXYCODONE-ACETAMINOPHEN 5-325 MG PO TABS: 1.0000 | ORAL_TABLET | ORAL | Status: DC | PRN

## 2021-02-11 NOTE — ED Notes (Signed)
Pt instructed on necessity of using a walker given recent hx of frequent falls. Pt to f/u with PCP. Pt verbalized understanding.   Pt provided discharge instructions and prescription information. Pt was given the opportunity to ask questions and questions were answered. Discharge signature not obtained in the setting of the COVID-19 pandemic in order to reduce high touch surfaces.

## 2021-02-11 NOTE — Discharge Instructions (Addendum)
The testing today does not show any problems that require you to stay in the hospital or get further testing done.  It is important to call your primary care doctor for a follow-up appointment as soon as possible.  Continue taking your prescribed medications.

## 2021-02-11 NOTE — ED Provider Notes (Signed)
[facility] EMERGENCY DEPARTMENT Provider Note   CSN: [id number] Arrival date & time: 02/11/21  8921     History Chief Complaint  Patient presents with   Sabino Gasser is a 60 y.o. male.  HPI He presents by private vehicle, drove his own car. Patient presents for evaluation of "I keep falling, and cannot hold anything in my hands. " He states this occurs when he goes to a friend's house or when he goes to the stores and states "it is embarrassing."  He was hospitalized and discharged, 2 days ago with similar complaints.  He also complains of lower extremity swelling for 2 weeks.  He dialyzes and is due to dialyze tomorrow.  There are no other known active modifying factors.    Past Medical History:  Diagnosis Date   Anemia    Diabetes mellitus without complication [facility])    patient denies   Dialysis patient [facility])    End stage chronic kidney disease ([place])    Hypertension    ICH (intracerebral hemorrhage) ([place]) 05/20/2017   Shoulder pain, left 06/28/2013    Patient Active Problem List   Diagnosis Date Noted   UGI bleed 02/07/2021   Rotator cuff tear arthropathy of right shoulder 01/23/2021   Alcohol dependence ([place]) 01/13/2021   Gastric ulcer with hemorrhage 01/13/2021   Generalized weakness    Transaminitis 01/10/2021   Pain in joint of right shoulder 11/14/2020   Right hip pain 11/14/2020   Seizure-like activity ([place]) 10/27/2020   History of alcohol abuse 10/27/2020   History of cocaine abuse ([place]) 10/27/2020   Nicotine dependence, cigarettes, uncomplicated 19/41/7408   Fever 10/27/2020   Acute GI bleeding 08/10/2020   Gastrointestinal hemorrhage with melena    Duodenal ulcer with hemorrhage    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   ESRD on hemodialysis ([place])    AVM (arteriovenous malformation) of small bowel, acquired with hemorrhage    Symptomatic anemia 07/31/2019   Acute on chronic anemia 07/23/2019   Chronic kidney  disease, stage V ([name])    Chronic hepatitis C without hepatic coma ([place]) 07/11/2019   Iron deficiency anemia 03/30/2019   CKD (chronic kidney disease), stage V ([place]) 03/30/2019   Alcohol use disorder 03/30/2019   B12 deficiency 03/30/2019   Orthostatic hypotension 01/22/2019   Anemia    Essential hypertension 06/29/2016   DM2 (diabetes mellitus, type 2) ([place]) 06/29/2016    Past Surgical History:  Procedure Laterality Date   A/V FISTULAGRAM N/A 08/15/2020   Procedure: A/V FISTULAGRAM - Left Upper;  Surgeon: Cherre Robins, MD;  Location: [place] CV [facility];  Service: Cardiovascular;  Laterality: N/A;   APPENDECTOMY     AV FISTULA PLACEMENT Left 08/03/2019   Procedure: LEFT ARM ARTERIOVENOUS (AV) CEPHALIC  FISTULA CREATION;  Surgeon: Waynetta Sandy, MD;  Location: [place];  Service: Vascular;  Laterality: Left;   BIOPSY  06/30/2019   Procedure: BIOPSY;  Surgeon: Ronald Lobo, MD;  Location: [place];  Service: Endoscopy;;   BIOPSY  08/02/2019   Procedure: BIOPSY;  Surgeon: Yetta Flock, MD;  Location: [place];  Service: Gastroenterology;;   BIOPSY  02/08/2021   Procedure: BIOPSY;  Surgeon: Sharyn Creamer, MD;  Location: [place];  Service: Gastroenterology;;   COLONOSCOPY  01/23/2012   Procedure: COLONOSCOPY;  Surgeon: Danie Binder, MD;  [facility];  Service: Endoscopy;  Laterality: N/A;  11:10 AM   COLONOSCOPY WITH PROPOFOL N/A 06/30/2019  Procedure: COLONOSCOPY WITH PROPOFOL;  Surgeon: Ronald Lobo, MD;  Location: [place];  Service: Endoscopy;  Laterality: N/A;   ENTEROSCOPY N/A 08/02/2019   Procedure: ENTEROSCOPY;  Surgeon: Yetta Flock, MD;  Location: [facility] ENDOSCOPY;  Service: Gastroenterology;  Laterality: N/A;   ESOPHAGOGASTRODUODENOSCOPY N/A 08/10/2020   Procedure: ESOPHAGOGASTRODUODENOSCOPY (EGD);  Surgeon: Jerene Bears, MD;  Location: [facility] ENDOSCOPY;  Service: Gastroenterology;  Laterality: N/A;   ESOPHAGOGASTRODUODENOSCOPY  (EGD) WITH PROPOFOL N/A 06/30/2019   Procedure: ESOPHAGOGASTRODUODENOSCOPY (EGD) WITH PROPOFOL;  Surgeon: Ronald Lobo, MD;  Location: [place];  Service: Endoscopy;  Laterality: N/A;   ESOPHAGOGASTRODUODENOSCOPY (EGD) WITH PROPOFOL N/A 01/12/2021   Procedure: ESOPHAGOGASTRODUODENOSCOPY (EGD) WITH PROPOFOL;  Surgeon: Sharyn Creamer, MD;  Location: [place];  Service: Gastroenterology;  Laterality: N/A;   ESOPHAGOGASTRODUODENOSCOPY (EGD) WITH PROPOFOL N/A 02/08/2021   Procedure: ESOPHAGOGASTRODUODENOSCOPY (EGD) WITH PROPOFOL;  Surgeon: Sharyn Creamer, MD;  Location: [place];  Service: Gastroenterology;  Laterality: N/A;   FISTULA SUPERFICIALIZATION Left 10/17/2019   Procedure: LEFT UPPER EXTREMITY FISTULA REVISION, SIDE BRANCH LIGATION,  AND SUPERFICIALIZATION;  Surgeon: Marty Heck, MD;  Location: [place];  Service: Vascular;  Laterality: Left;   GIVENS CAPSULE STUDY N/A 06/30/2019   Procedure: GIVENS CAPSULE STUDY;  Surgeon: Ronald Lobo, MD;  Location: [place];  Service: Endoscopy;  Laterality: N/A;   HEMOSTASIS CLIP PLACEMENT  08/10/2020   Procedure: HEMOSTASIS CLIP PLACEMENT;  Surgeon: Jerene Bears, MD;  Location: [facility] ENDOSCOPY;  Service: Gastroenterology;;   HEMOSTASIS CLIP PLACEMENT  01/12/2021   Procedure: HEMOSTASIS CLIP PLACEMENT;  Surgeon: Sharyn Creamer, MD;  Location[facility] END[facility]COPY;  Service: Gastroenterology;;   HEMOSTASIS CONTROL  08/02/2019   Procedure: HEMOSTASIS CONTROL;  Surgeon: Yetta Flock, MD;  Location: [facility] ENDOSCOPY;  Service: Gastroenterology;;   INCISION AND DRAINAGE ABSCESS N/A 06/29/2016   Procedure: INCISION AND DRAINAGE ABDOMINAL WALL ABSCESS;  Surgeon: Alphonsa Overall, MD;  Location: [facility];  Service: General;  Laterality: N/A;   INSERTION OF DIALYSIS CATHETER Right 08/03/2019   Procedure: INSERTION OF DIALYSIS CATHETER;  Surgeon: Waynetta Sandy, MD;  Location: [place];  Service: Vascular;  Laterality: Right;   INSERTION OF DIALYSIS  CATHETER Right 10/22/2019   Procedure: INSERTION OF 23CM TUNNELED DIALYSIS CATHETER RIGHT INTERNAL JUGULAR;  Surgeon: Angelia Mould, MD;  Location: [place];  Service: Vascular;  Laterality: Right;   INSERTION OF DIALYSIS CATHETER Right 08/12/2020   Procedure: INSERTION OF Right internal Jugular TUNNELED  DIALYSIS CATHETER.;  Surgeon: Waynetta Sandy, MD;  Location: [place];  Service: Vascular;  Laterality: Right;   Left heel surgery     PENILE BIOPSY N/A 03/26/2020   Procedure: PENILE ULCER DEBRIDEMENT;  Surgeon: Remi Haggard, MD;  Location: [facility];  Service: Urology;  Laterality: N/A;  [place]  01/12/2021   Procedure: SCLEROTHERAPY;  Surgeon: Sharyn Creamer, MD;  Location: [facility] ENDOSCOPY;  Service: Gastroenterology;;       Family History  Problem Relation Age of Onset   Colon cancer Neg Hx     Social History   Tobacco Use   Smoking status: Every Day    Packs/day: 1.00    Years: 30.00    Pack years: 30.00    Types: Cigarettes   Smokeless tobacco: Never  Vaping Use   Vaping Use: Never used  Substance Use Topics   Alcohol use: Not Currently    Alcohol/week: 12.0 standard drinks    Types: 12 Cans of beer per week   Drug  use: Not Currently    Types: "Crack" cocaine    Comment: last in 2020    Home Medications Prior to Admission medications   Medication Sig Start Date End Date Taking? Authorizing Provider  amLODipine (NORVASC) 10 MG tablet Take 10 mg by mouth in the morning and at bedtime. 01/01/21   [provider]  blood glucose meter kit and supplies KIT Dispense based on patient and insurance preference. Use up to four times daily as directed. (FOR ICD-9 250.00, 250.01). 07/01/16   Nita Sells, MD  Calcium Acetate 667 MG TABS Take 1,334 mg by mouth 4 (four) times daily. 09/11/20   [provider]  cetirizine (ZYRTEC ALLERGY) 10 MG tablet Take 1 tablet (10 mg total) by mouth daily as needed for allergies. 08/22/20    [name], NP  cloNIDine (CATAPRES) 0.2 MG tablet Take  1 tablet twice daily . Patient taking differently: Take 0.2 mg by mouth 2 (two) times daily. Take  1 tablet twice daily . 10/28/20   Donnamae Jude, MD  doxepin (SINEQUAN) 10 MG capsule Take 10 mg by mouth at bedtime. 01/03/21   [provider]  gabapentin (NEURONTIN) 100 MG capsule Take 1 capsule (100 mg total) by mouth at bedtime. 08/16/20   Florencia Reasons, MD  gabapentin (NEURONTIN) 300 MG capsule Take 300 mg by mouth in the morning, at noon, and at bedtime. 02/04/21   [provider]  hydrOXYzine (ATARAX/VISTARIL) 25 MG tablet TAKE 1 TABLET BY MOUTH EVERY 8 HOURS AS NEEDED FOR ANXIETY OR  ITCHING Patient taking differently: Take 25 mg by mouth in the morning and at bedtime. 11/07/20   [name], NP  lidocaine-prilocaine (EMLA) cream APPLY SMALL AMOUNT TO ACCESS SITE (AVF) 1 TO 2 HOURS BEFORE DIALYSIS. COVER WITH OCCLUSIVE DRESSING (SARAN WRAP) 12/27/20   [provider]  oxyCODONE-acetaminophen (PERCOCET/ROXICET) 5-325 MG tablet Take 1 tablet by mouth every 8 (eight) hours as needed for severe pain. 11/12/20   Jaynee Eagles, PA-C  pantoprazole (PROTONIX) 40 MG tablet Take 1 tablet (40 mg total) by mouth 2 (two) times daily. 02/09/21   Ghimire, Henreitta Leber, MD  sucralfate (CARAFATE) 1 g tablet Take 1 g by mouth daily. 01/30/21   [provider]  colchicine 0.6 MG tablet Take 0.5 tablets (0.3 mg total) by mouth 2 (two) times daily. 07/24/20 07/29/20  Noemi Chapel, MD  ferrous sulfate 325 (65 FE) MG tablet SMARTSIG:1 Tablet(s) By Mouth Twice Daily 08/11/19 02/09/20  [provider]  furosemide (LASIX) 40 MG tablet Take 1 tablet (40 mg total) by mouth daily. 07/25/19 02/09/20  Ladona Horns, MD    Allergies    Dilaudid [hydromorphone hcl]  Review of Systems   Review of Systems  All other systems reviewed and are negative.  Physical Exam Updated Vital Signs BP (!) 142/74 (BP Location: Right Arm)    Pulse 80   Temp (!) 97.5 F (36.4 C) (Oral)   Resp (!) 22   Ht 5' 11"  (1.803 m)   Wt 129.7 kg   SpO2 97%   BMI 39.88 kg/m   Physical Exam Vitals and nursing note reviewed.  Constitutional:      General: He is not in acute distress.    Appearance: He is well-developed. He is obese. He is not ill-appearing, toxic-appearing or diaphoretic.  HENT:     Head: Normocephalic and atraumatic.     Right Ear: External ear normal.     Left Ear: External ear normal.  Eyes:  Conjunctiva/sclera: Conjunctivae normal.     Pupils: Pupils are equal, round, and reactive to light.  Neck:     Trachea: Phonation normal.  Cardiovascular:     Rate and Rhythm: Normal rate.     Comments: Normal pulsation, fistula, left upper arm Pulmonary:     Effort: Pulmonary effort is normal.  Abdominal:     Tenderness: There is no abdominal tenderness.  Musculoskeletal:        General: Normal range of motion.     Cervical back: Normal range of motion and neck supple.  Skin:    General: Skin is warm and dry.  Neurological:     Mental Status: He is alert and oriented to person, place, and time.     Cranial Nerves: No cranial nerve deficit.     Sensory: No sensory deficit.     Motor: No abnormal muscle tone.     Coordination: Coordination normal.  Psychiatric:        Behavior: Behavior normal.        Thought Content: Thought content normal.        Judgment: Judgment normal.    ED Results / Procedures / Treatments   Labs (all labs ordered are listed, but only abnormal results are displayed) Labs Reviewed  BASIC METABOLIC PANEL - Abnormal; Notable for the following components:      Result Value   Sodium 133 (*)    Chloride 95 (*)    Glucose, Bld 135 (*)    BUN 44 (*)    Creatinine, Ser 10.47 (*)    GFR, Estimated 5 (*)    All other components within normal limits  CBC - Abnormal; Notable for the following components:   RBC 2.28 (*)    Hemoglobin 8.1 (*)    HCT 25.4 (*)    MCV 111.4 (*)     MCH 35.5 (*)    RDW 18.6 (*)    Platelets 129 (*)    All other components within normal limits  URINALYSIS, ROUTINE W REFLEX MICROSCOPIC  CBG MONITORING, ED    EKG None  Radiology DG Knee Complete 4 Views Left  Result Date: 02/11/2021 CLINICAL DATA:  Leg weakness, recent fall EXAM: LEFT KNEE - COMPLETE 4+ VIEW COMPARISON:  CT 01/05/2021 FINDINGS: No fracture, dislocation, or effusion. No significant osseous degenerative change. Old proximal tibial shaft bone infarct. Extensive femoropopliteal and tibial arterial calcifications. IMPRESSION: 1. No acute bone findings. 2. Extensive arterial calcifications. Electronically Signed   By: Lucrezia Europe M.D.   On: 02/11/2021 07:49   DG Knee Complete 4 Views Right  Result Date: 02/11/2021 CLINICAL DATA:  Weakness, recent fall EXAM: RIGHT KNEE - COMPLETE 4+ VIEW COMPARISON:  CT 01/05/2021 FINDINGS: No evidence of fracture, dislocation, or joint effusion. 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 No evidence of arthropathy or acute bone abnormality. Extensive arterial calcifications. IMPRESSION: 1. No acute bone findings. 2. Extensive arterial calcifications Electronically Signed   By: Lucrezia Europe M.D.   On: 02/11/2021 07:50    Procedures Procedures   Medications Ordered in ED Medications  HYDROcodone-acetaminophen (NORCO/VICODIN) 5-325 MG per tablet 1 tablet (1 tablet Oral Given 02/11/21 0720)    ED Course  I have reviewed the triage vital signs and the nursing notes.  Pertinent labs & imaging results that were available during my care of the patient were reviewed by me and considered in my medical decision making (see chart for details).    MDM Rules/Calculators/A&P  Patient Vitals for the past 24 hrs:  BP Temp Temp src Pulse Resp SpO2 Height Weight  02/11/21 1058 (!) 142/74 -- -- 80 (!) 22 97 % -- --  02/11/21 0950 123/78 -- -- 79 16 100 % -- --  02/11/21 0706 -- -- -- -- -- -- 5' 11"  (1.803 m) 129.7  kg  02/11/21 0659 123/80 (!) 97.5 F (36.4 C) Oral 91 17 100 % -- --    At the time of discharge- reevaluation with update and discussion. After initial assessment and treatment, an updated evaluation reveals no change in status, findings discussed and questions answered. Daleen Bo   Medical Decision Making:  This patient is presenting for evaluation of recurrent falling which was recently addressed during hospitalization, which does require a range of treatment options, and is not a complaint that involves a high risk of morbidity and mortality. The differential diagnoses include nonspecific weakness, acute neurologic disorder, central nervous system disorder. I decided to review old records, and in summary elderly male with a history of hypertension, diabetes, end-stage renal disease, seizures, alcohol abuse, cocaine abuse, nonspecific weakness.  I did not require additional historical information from anyone.  Clinical Laboratory Tests Ordered, included CBC and Metabolic panel. Review indicates normal except sodium low, chloride low, glucose high, BUN high, creatinine high, hemoglobin low, MCV high. Radiologic Tests Ordered, included x-ray knees.  I independently Visualized: Radiographic images, which show no acute abnormalities    Critical Interventions-clinical evaluation, laboratory testing, radiography, observation reassessment  After These Interventions, the Patient was reevaluated and was found at baseline.  He had a OT evaluation, done 2 days ago that showed he was stable for home care.  He has been referred to his PCP.  No reason to change that plan.  CRITICAL CARE-no Performed by: Daleen Bo  Nursing Notes Reviewed/ Care Coordinated Applicable Imaging Reviewed Interpretation of Laboratory Data incorporated into ED treatment  The patient appears reasonably screened and/or stabilized for discharge and I doubt any other medical condition or other [facility] requiring further  screening, evaluation, or treatment in the ED at this time prior to discharge.  Plan: Home Medications-continue usual; Home Treatments-gradually [employer] and activity; return here if the recommended treatment, does not improve the symptoms; Recommended follow up-PCP as planned     Final Clinical Impression(s) / ED Diagnoses Final diagnoses:  Fall, initial encounter    Rx / DC Orders ED Discharge Orders     None        Daleen Bo, MD 02/13/21 1900

## 2021-02-11 NOTE — Telephone Encounter (Signed)
Transition Care Management Unsuccessful   Date of discharge and from where:  02/09/2021, [facility]  Currently in ED at [facility]

## 2021-02-11 NOTE — ED Triage Notes (Signed)
Pt here POV d/t fall and continued weakness. Pt states he wants this issue fixed. Legs buckle under him without notice and his hands will drop items randomly. No neuro deficits noted in triage. Alert and oriented X4.  Injuries of scrapes noted on bilateral knees. Pain 8/10

## 2021-02-12 ENCOUNTER — Telehealth: Payer: Self-pay

## 2021-02-12 NOTE — Telephone Encounter (Signed)
Transition Care Management Unsuccessful Follow-up Telephone Call  Date of discharge and from where:  02/09/2021, [place] in ED 02/11/2021.   Attempts:  2nd Attempt  Reason for unsuccessful TCM follow-up call:  Left voice message on # [phone].  Patient has appointment with [name], NP 02/13/2021.

## 2021-02-13 ENCOUNTER — Telehealth: Payer: Self-pay

## 2021-02-13 ENCOUNTER — Encounter (INDEPENDENT_AMBULATORY_CARE_PROVIDER_SITE_OTHER): Payer: Self-pay | Admitting: Primary Care

## 2021-02-13 ENCOUNTER — Ambulatory Visit (INDEPENDENT_AMBULATORY_CARE_PROVIDER_SITE_OTHER): Payer: Medicare Other | Admitting: Primary Care

## 2021-02-13 ENCOUNTER — Other Ambulatory Visit: Payer: Self-pay

## 2021-02-13 ENCOUNTER — Inpatient Hospital Stay (INDEPENDENT_AMBULATORY_CARE_PROVIDER_SITE_OTHER): Payer: Medicare Other | Admitting: Primary Care

## 2021-02-13 VITALS — BP 120/70 | HR 74 | Temp 97.5°F | Ht 71.0 in | Wt 300.4 lb

## 2021-02-13 DIAGNOSIS — R42 Dizziness and giddiness: Secondary | ICD-10-CM | POA: Diagnosis not present

## 2021-02-13 DIAGNOSIS — Z09 Encounter for follow-up examination after completed treatment for conditions other than malignant neoplasm: Secondary | ICD-10-CM

## 2021-02-13 DIAGNOSIS — R531 Weakness: Secondary | ICD-10-CM

## 2021-02-13 DIAGNOSIS — R296 Repeated falls: Secondary | ICD-10-CM | POA: Diagnosis not present

## 2021-02-13 NOTE — Telephone Encounter (Signed)
Transition Care Management Unsuccessful Follow-up Telephone Call  Date of discharge and from where:  02/09/2021, [facility] - seen in ED 02/11/2021  Attempts:  3rd Attempt  Reason for unsuccessful TCM follow-up call:  Left voice message on # [phone].   Patient has an appointment today with [name], NP @ [facility]

## 2021-02-13 NOTE — Patient Instructions (Addendum)
[employer] # [phone] [employer] # [phone] [facility] # [phone] Burnetta Sabin # [phone]  Influenza, Adult Influenza is also called "the flu." It is an infection in the lungs, nose, and throat (respiratory tract). It spreads easily from person to person (is contagious). The flu causes symptoms that are like a cold, along with high fever and body aches. What are the causes? This condition is caused by the influenza virus. You can get the virus by: Breathing in droplets that are in the air after a person infected with the flu coughed or sneezed. Touching something that has the virus on it and then touching your mouth, nose, or eyes. What increases the risk? Certain things may make you more likely to get the flu. These include: Not washing your hands often. Having close contact with many people during cold and flu season. Touching your mouth, eyes, or nose without first washing your hands. Not getting a flu shot every year. You may have a higher risk for the flu, and serious problems, such as a lung infection (pneumonia), if you: Are older than 65. Are pregnant. Have a weakened disease-fighting system (immune system) because of a disease or because you are taking certain medicines. Have a long-term (chronic) condition, such as: Heart, kidney, or lung disease. Diabetes. Asthma. Have a liver disorder. Are very overweight (morbidly obese). Have anemia. What are the signs or symptoms? Symptoms usually begin suddenly and last 4-14 days. They may include: Fever and chills. Headaches, body aches, or muscle aches. Sore throat. Cough. Runny or stuffy (congested) nose. Feeling discomfort in your chest. Not wanting to eat as much as normal. Feeling weak or tired. Feeling dizzy. Feeling sick to your stomach or throwing up. How is this treated? If the flu is found early, you can be treated with antiviral medicine. This can help to reduce  how bad the illness is and how long it lasts. This may be given by mouth or through an IV tube. Taking care of yourself at home can help your symptoms get better. Your doctor may want you to: Take over-the-counter medicines. Drink plenty of fluids. The flu often goes away on its own. If you have very bad symptoms or other problems, you may be treated in a hospital. Follow these instructions at home:   Activity Rest as needed. Get plenty of sleep. Stay home from work or school as told by your doctor. Do not leave home until you do not have a fever for 24 hours without taking medicine. Leave home only to go to your doctor. Eating and drinking Take an ORS (oral rehydration solution). This is a drink that is sold at pharmacies and stores. Drink enough fluid to keep your pee pale yellow. Drink clear fluids in small amounts as you are able. Clear fluids include: Water. Ice chips. Fruit juice mixed with water. Low-calorie sports drinks. Eat bland foods that are easy to digest. Eat small amounts as you are able. These foods include: Bananas. Applesauce. Rice. Lean meats. Toast. Crackers. Do not eat or drink: Fluids that have a lot of sugar or caffeine. Alcohol. Spicy or fatty foods. General instructions Take over-the-counter and prescription medicines only as told by your doctor. Use a cool mist humidifier to add moisture to the air in your home. This can make it easier for you to breathe. When using a cool mist humidifier, clean it daily. Empty water and replace with clean water. Cover your mouth and nose when you cough or sneeze.  Wash your hands with soap and water often and for at least 20 seconds. This is also important after you cough or sneeze. If you cannot use soap and water, use alcohol-based hand sanitizer. Keep all follow-up visits. How is this prevented?  Get a flu shot every year. You may get the flu shot in late summer, fall, or winter. Ask your doctor when you should  get your flu shot. Avoid contact with people who are sick during fall and winter. This is cold and flu season. Contact a doctor if: You get new symptoms. You have: Chest pain. Watery poop (diarrhea). A fever. Your cough gets worse. You start to have more mucus. You feel sick to your stomach. You throw up. Get help right away if you: Have shortness of breath. Have trouble breathing. Have skin or nails that turn a bluish color. Have very bad pain or stiffness in your neck. Get a sudden headache. Get sudden pain in your face or ear. Cannot eat or drink without throwing up. These symptoms may represent a serious problem that is an emergency. Get medical help right away. Call your local emergency services (911 in the [place] Do not wait to see if the symptoms will go away. Do not drive yourself to the hospital. Summary Influenza is also called "the flu." It is an infection in the lungs, nose, and throat. It spreads easily from person to person. Take over-the-counter and prescription medicines only as told by your doctor. Getting a flu shot every year is the best way to not get the flu. This information is not intended to replace advice given to you by your health care provider. Make sure you discuss any questions you have with your health care provider. Document Revised: 12/02/2019 Document Reviewed: 12/02/2019 Elsevier Patient Education  [place].

## 2021-02-13 NOTE — Telephone Encounter (Signed)
Patient was at this appointment at [facility] with Daniel Mire, NP when this CM was contacted. He has been living with his cousin for 6 years and the cousin recently died, so he will need to move out of the apartment.  He is not on the lease and said he is trying to find somewhere to live.  He needs to be out of the apartment by the end of the month.  Provided him with contact # for [employer], [employer], [employer] and [employer].

## 2021-02-13 NOTE — Progress Notes (Signed)
[facility]   Subjective:   [name] is a 60 y.o. male presents for emergency room follow up on 02/11/21 after a fall. States he was not drinking went to get up and walk - knees became weak and down he went. Imaging completed-No evidence of fracture, dislocation, or joint effusion. 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 No evidence of arthropathy or acute bone abnormality. Extensive arterial calcifications. Today he feels dizzy - room and him moving not to sure and feet are swollen and hurt. Pain feet 10/10. Denies shortness of breath, headaches, chest pain but  lower extremity edema present 3+ ( has dialysis tomorrow fluid would be removed. ) Past Medical History:  Diagnosis Date   Anemia    Diabetes mellitus without complication ([employer])    patient denies   Dialysis patient [facility])    End stage chronic kidney disease ([place])    Hypertension    ICH (intracerebral hemorrhage) ([place]) 05/20/2017   Shoulder pain, left 06/28/2013     Allergies  Allergen Reactions   Dilaudid [Hydromorphone Hcl] Itching and Other (See Comments)    Pt reports itchiness after IM injection       Current Outpatient Medications on File Prior to Visit  Medication Sig Dispense Refill   amLODipine (NORVASC) 10 MG tablet Take 10 mg by mouth in the morning and at bedtime.     blood glucose meter kit and supplies KIT Dispense based on patient and insurance preference. Use up to four times daily as directed. (FOR ICD-9 250.00, 250.01). 1 each 0   Calcium Acetate 667 MG TABS Take 1,334 mg by mouth 4 (four) times daily.     cetirizine (ZYRTEC ALLERGY) 10 MG tablet Take 1 tablet (10 mg total) by mouth daily as needed for allergies. 90 tablet 1   cloNIDine (CATAPRES) 0.2 MG tablet Take  1 tablet twice daily . (Patient taking differently: Take 0.2 mg by mouth 2 (two) times daily. Take  1 tablet twice daily .) 180 tablet 1   doxepin (SINEQUAN) 10 MG capsule Take 10 mg by mouth at bedtime.      gabapentin (NEURONTIN) 100 MG capsule Take 1 capsule (100 mg total) by mouth at bedtime. 30 capsule 0   gabapentin (NEURONTIN) 300 MG capsule Take 300 mg by mouth in the morning, at noon, and at bedtime.     hydrOXYzine (ATARAX/VISTARIL) 25 MG tablet TAKE 1 TABLET BY MOUTH EVERY 8 HOURS AS NEEDED FOR ANXIETY OR  ITCHING (Patient taking differently: Take 25 mg by mouth in the morning and at bedtime.) 90 tablet 0   lidocaine-prilocaine (EMLA) cream APPLY SMALL AMOUNT TO ACCESS SITE (AVF) 1 TO 2 HOURS BEFORE DIALYSIS. COVER WITH OCCLUSIVE DRESSING (SARAN WRAP)     pantoprazole (PROTONIX) 40 MG tablet Take 1 tablet (40 mg total) by mouth 2 (two) times daily. 60 tablet 2   sucralfate (CARAFATE) 1 g tablet Take 1 g by mouth daily.     [DISCONTINUED] colchicine 0.6 MG tablet Take 0.5 tablets (0.3 mg total) by mouth 2 (two) times daily. 14 tablet 0   [DISCONTINUED] ferrous sulfate 325 (65 FE) MG tablet SMARTSIG:1 Tablet(s) By Mouth Twice Daily     [DISCONTINUED] furosemide (LASIX) 40 MG tablet Take 1 tablet (40 mg total) by mouth daily. 30 tablet 0   No current facility-administered medications on file prior to visit.     Review of System: Comprehensive ROS completed and pertinent positives noted in HPI  Objective:  BP 120/70 (  BP Location: Right Arm, Patient Position: Sitting, Cuff Size: Large)   Pulse 74   Temp (!) 97.5 F (36.4 C) (Temporal)   Ht 5' 11"  (1.803 m)   Wt (!) 300 lb 6.4 oz (136.3 kg)   SpO2 98%   BMI 41.90 kg/m   Filed Weights   02/13/21 1449  Weight: (!) 300 lb 6.4 oz (136.3 kg)    Physical Exam: General Appearance: Well nourished, morbid obese male in no apparent distress. Eyes: PERRLA, EOMs, conjunctiva no swelling or erythema Sinuses: No Frontal/maxillary tenderness ENT/Mouth: Ext aud canals clear, TMs without erythema, bulging. No erythema, swelling, or exudate on post pharynx.  Tonsils not swollen or erythematous. Hearing normal.  Neck: Supple, thyroid normal.   Respiratory: Respiratory effort normal, BS equal bilaterally without rales, rhonchi, wheezing or stridor.  Cardio: RRR with no MRGs. Brisk peripheral pulses without edema.  Abdomen: Soft, + BS.  Non tender, no guarding, rebound, hernias, masses. Lymphatics: Non tender without lymphadenopathy.  Musculoskeletal: Full ROM, 54/5 strength, abnormal gait. (Fistula left upper arm positive for thrill and feel) Skin: Warm, dry without rashes, lesions, ecchymosis.  Psych: Awake and oriented X 3, normal affect, Insight and Judgment appropriate.    Assessment:  Daniel [name] was seen today for hospitalization follow-up.  Diagnoses and all orders for this visit:  Hospital discharge follow-up Retrieved from ED discharge Follow-Ups: Schedule an appointment with [name], NP (Internal Medicine); To be seen for further care and treatment  Generalized weakness With frequent falls recommend PT  Falls frequently Has steps difficult to maneuver - looking for another place to live, no throw rugs, has rails to get into the house no adaptive equipment in bathroom and lighting is good    Dizzy spells Reviewed medication he has 2 bottles of clonidine 0.90m  bid and another clonidine .215mBid. Start with taking only clonodine 0.76m92mid and re-evaluate BP  This note has been created with [employer], quantityny transcriptional errors are unintentional.   MicKerin PernaP 02/13/2021, 3:01 PM

## 2021-02-15 ENCOUNTER — Ambulatory Visit: Payer: Medicare Other | Admitting: Physician Assistant

## 2021-02-23 ENCOUNTER — Emergency Department (HOSPITAL_COMMUNITY): Payer: Medicare Other

## 2021-02-23 ENCOUNTER — Other Ambulatory Visit: Payer: Self-pay

## 2021-02-23 ENCOUNTER — Encounter (HOSPITAL_COMMUNITY): Payer: Self-pay

## 2021-02-23 ENCOUNTER — Inpatient Hospital Stay (HOSPITAL_COMMUNITY)
Admission: EM | Admit: 2021-02-23 | Discharge: 2021-02-28 | DRG: 377 | Disposition: A | Discharge status: Home / Self Care | Payer: Medicare Other | Attending: Internal Medicine | Admitting: Internal Medicine

## 2021-02-23 DIAGNOSIS — I2510 Atherosclerotic heart disease of native coronary artery without angina pectoris: Secondary | ICD-10-CM | POA: Diagnosis present

## 2021-02-23 DIAGNOSIS — K922 Gastrointestinal hemorrhage, unspecified: Secondary | ICD-10-CM | POA: Diagnosis present

## 2021-02-23 DIAGNOSIS — I120 Hypertensive chronic kidney disease with stage 5 chronic kidney disease or end stage renal disease: Secondary | ICD-10-CM | POA: Diagnosis present

## 2021-02-23 DIAGNOSIS — N186 End stage renal disease: Secondary | ICD-10-CM | POA: Diagnosis present

## 2021-02-23 DIAGNOSIS — E1122 Type 2 diabetes mellitus with diabetic chronic kidney disease: Secondary | ICD-10-CM | POA: Diagnosis present

## 2021-02-23 DIAGNOSIS — K317 Polyp of stomach and duodenum: Secondary | ICD-10-CM | POA: Diagnosis not present

## 2021-02-23 DIAGNOSIS — K7469 Other cirrhosis of liver: Secondary | ICD-10-CM | POA: Diagnosis present

## 2021-02-23 DIAGNOSIS — F17210 Nicotine dependence, cigarettes, uncomplicated: Secondary | ICD-10-CM | POA: Diagnosis present

## 2021-02-23 DIAGNOSIS — Z992 Dependence on renal dialysis: Secondary | ICD-10-CM

## 2021-02-23 DIAGNOSIS — Z6832 Body mass index (BMI) 32.0-32.9, adult: Secondary | ICD-10-CM

## 2021-02-23 DIAGNOSIS — B182 Chronic viral hepatitis C: Secondary | ICD-10-CM | POA: Diagnosis present

## 2021-02-23 DIAGNOSIS — I10 Essential (primary) hypertension: Secondary | ICD-10-CM | POA: Diagnosis present

## 2021-02-23 DIAGNOSIS — E669 Obesity, unspecified: Secondary | ICD-10-CM | POA: Diagnosis present

## 2021-02-23 DIAGNOSIS — K921 Melena: Secondary | ICD-10-CM | POA: Diagnosis present

## 2021-02-23 DIAGNOSIS — F1010 Alcohol abuse, uncomplicated: Secondary | ICD-10-CM | POA: Diagnosis present

## 2021-02-23 DIAGNOSIS — R9431 Abnormal electrocardiogram [ECG] [EKG]: Secondary | ICD-10-CM | POA: Diagnosis present

## 2021-02-23 DIAGNOSIS — D649 Anemia, unspecified: Secondary | ICD-10-CM | POA: Diagnosis not present

## 2021-02-23 DIAGNOSIS — Z79899 Other long term (current) drug therapy: Secondary | ICD-10-CM

## 2021-02-23 DIAGNOSIS — D62 Acute posthemorrhagic anemia: Secondary | ICD-10-CM

## 2021-02-23 DIAGNOSIS — Z885 Allergy status to narcotic agent status: Secondary | ICD-10-CM | POA: Diagnosis not present

## 2021-02-23 DIAGNOSIS — K2970 Gastritis, unspecified, without bleeding: Secondary | ICD-10-CM | POA: Diagnosis not present

## 2021-02-23 DIAGNOSIS — G8929 Other chronic pain: Secondary | ICD-10-CM | POA: Diagnosis present

## 2021-02-23 DIAGNOSIS — D1330 Benign neoplasm of unspecified part of small intestine: Secondary | ICD-10-CM

## 2021-02-23 DIAGNOSIS — K31811 Angiodysplasia of stomach and duodenum with bleeding: Secondary | ICD-10-CM | POA: Diagnosis present

## 2021-02-23 DIAGNOSIS — Z8711 Personal history of peptic ulcer disease: Secondary | ICD-10-CM

## 2021-02-23 DIAGNOSIS — R531 Weakness: Secondary | ICD-10-CM | POA: Diagnosis present

## 2021-02-23 DIAGNOSIS — K2270 Barrett's esophagus without dysplasia: Secondary | ICD-10-CM | POA: Diagnosis present

## 2021-02-23 DIAGNOSIS — E119 Type 2 diabetes mellitus without complications: Secondary | ICD-10-CM

## 2021-02-23 DIAGNOSIS — N2581 Secondary hyperparathyroidism of renal origin: Secondary | ICD-10-CM | POA: Diagnosis present

## 2021-02-23 DIAGNOSIS — D631 Anemia in chronic kidney disease: Secondary | ICD-10-CM | POA: Diagnosis present

## 2021-02-23 DIAGNOSIS — B1920 Unspecified viral hepatitis C without hepatic coma: Secondary | ICD-10-CM | POA: Diagnosis present

## 2021-02-23 DIAGNOSIS — Z20822 Contact with and (suspected) exposure to covid-19: Secondary | ICD-10-CM | POA: Diagnosis present

## 2021-02-23 DIAGNOSIS — K5520 Angiodysplasia of colon without hemorrhage: Secondary | ICD-10-CM

## 2021-02-23 DIAGNOSIS — N189 Chronic kidney disease, unspecified: Secondary | ICD-10-CM | POA: Diagnosis present

## 2021-02-23 DIAGNOSIS — A048 Other specified bacterial intestinal infections: Secondary | ICD-10-CM

## 2021-02-23 DIAGNOSIS — F1011 Alcohol abuse, in remission: Secondary | ICD-10-CM | POA: Diagnosis present

## 2021-02-23 DIAGNOSIS — D500 Iron deficiency anemia secondary to blood loss (chronic): Secondary | ICD-10-CM | POA: Diagnosis present

## 2021-02-23 DIAGNOSIS — M898X9 Other specified disorders of bone, unspecified site: Secondary | ICD-10-CM | POA: Diagnosis present

## 2021-02-23 DIAGNOSIS — E1169 Type 2 diabetes mellitus with other specified complication: Secondary | ICD-10-CM | POA: Diagnosis not present

## 2021-02-23 LAB — CBC
HCT: 14.9 % — ABNORMAL LOW (ref 39.0–52.0)
HCT: 21.3 % — ABNORMAL LOW (ref 39.0–52.0)
Hemoglobin: 4.8 g/dL — CL (ref 13.0–17.0)
Hemoglobin: 7.1 g/dL — ABNORMAL LOW (ref 13.0–17.0)
MCH: 36.4 pg — ABNORMAL HIGH (ref 26.0–34.0)
MCH: 34.3 pg — ABNORMAL HIGH (ref 26.0–34.0)
MCHC: 32.2 g/dL (ref 30.0–36.0)
MCHC: 33.3 g/dL (ref 30.0–36.0)
MCV: 112.9 fL — ABNORMAL HIGH (ref 80.0–100.0)
MCV: 102.9 fL — ABNORMAL HIGH (ref 80.0–100.0)
Platelets: 129 10*3/uL — ABNORMAL LOW (ref 150–400)
Platelets: 114 10*3/uL — ABNORMAL LOW (ref 150–400)
RBC: 1.32 MIL/uL — ABNORMAL LOW (ref 4.22–5.81)
RBC: 2.07 MIL/uL — ABNORMAL LOW (ref 4.22–5.81)
RDW: 17.2 % — ABNORMAL HIGH (ref 11.5–15.5)
RDW: 22.4 % — ABNORMAL HIGH (ref 11.5–15.5)
WBC: 4.8 10*3/uL (ref 4.0–10.5)
WBC: 5.1 10*3/uL (ref 4.0–10.5)
nRBC: 1.7 % — ABNORMAL HIGH (ref 0.0–0.2)
nRBC: 2 % — ABNORMAL HIGH (ref 0.0–0.2)

## 2021-02-23 LAB — LIPASE, BLOOD: Lipase: 108 U/L — ABNORMAL HIGH (ref 11–51)

## 2021-02-23 LAB — CBC WITH DIFFERENTIAL/PLATELET
Abs Immature Granulocytes: 0.05 10*3/uL (ref 0.00–0.07)
Basophils Absolute: 0 10*3/uL (ref 0.0–0.1)
Basophils Relative: 0 %
Eosinophils Absolute: 0 10*3/uL (ref 0.0–0.5)
Eosinophils Relative: 0 %
HCT: 15.7 % — ABNORMAL LOW (ref 39.0–52.0)
Hemoglobin: 5 g/dL — CL (ref 13.0–17.0)
Immature Granulocytes: 1 %
Lymphocytes Relative: 32 %
Lymphs Abs: 1.7 10*3/uL (ref 0.7–4.0)
MCH: 36 pg — ABNORMAL HIGH (ref 26.0–34.0)
MCHC: 31.8 g/dL (ref 30.0–36.0)
MCV: 112.9 fL — ABNORMAL HIGH (ref 80.0–100.0)
Monocytes Absolute: 0.5 10*3/uL (ref 0.1–1.0)
Monocytes Relative: 10 %
Neutro Abs: 3.1 10*3/uL (ref 1.7–7.7)
Neutrophils Relative %: 57 %
Platelets: 137 10*3/uL — ABNORMAL LOW (ref 150–400)
RBC: 1.39 MIL/uL — ABNORMAL LOW (ref 4.22–5.81)
RDW: 17 % — ABNORMAL HIGH (ref 11.5–15.5)
WBC: 5.4 10*3/uL (ref 4.0–10.5)
nRBC: 1.5 % — ABNORMAL HIGH (ref 0.0–0.2)

## 2021-02-23 LAB — TROPONIN I (HIGH SENSITIVITY)
Troponin I (High Sensitivity): 15 ng/L (ref <18)
Troponin I (High Sensitivity): 15 ng/L (ref <18)

## 2021-02-23 LAB — COMPREHENSIVE METABOLIC PANEL
ALT: 24 U/L (ref 0–44)
AST: 79 U/L — ABNORMAL HIGH (ref 15–41)
Albumin: 2.6 g/dL — ABNORMAL LOW (ref 3.5–5.0)
Alkaline Phosphatase: 69 U/L (ref 38–126)
Anion gap: 15 (ref 5–15)
BUN: 44 mg/dL — ABNORMAL HIGH (ref 8–23)
CO2: 29 mmol/L (ref 22–32)
Calcium: 8.2 mg/dL — ABNORMAL LOW (ref 8.9–10.3)
Chloride: 96 mmol/L — ABNORMAL LOW (ref 98–111)
Creatinine, Ser: 9.33 mg/dL — ABNORMAL HIGH (ref 0.61–1.24)
GFR, Estimated: 6 mL/min — ABNORMAL LOW (ref >60)
Glucose, Bld: 98 mg/dL (ref 70–99)
Potassium: 3.7 mmol/L (ref 3.5–5.1)
Sodium: 140 mmol/L (ref 135–145)
Total Bilirubin: 0.9 mg/dL (ref 0.3–1.2)
Total Protein: 7.5 g/dL (ref 6.5–8.1)

## 2021-02-23 LAB — PREPARE RBC (CROSSMATCH)

## 2021-02-23 LAB — LACTIC ACID, PLASMA
Lactic Acid, Venous: 1.4 mmol/L (ref 0.5–1.9)
Lactic Acid, Venous: 1.4 mmol/L (ref 0.5–1.9)

## 2021-02-23 LAB — RESP PANEL BY RT-PCR (FLU A&B, COVID) ARPGX2
Influenza A by PCR: NEGATIVE
Influenza B by PCR: NEGATIVE
SARS Coronavirus 2 by RT PCR: NEGATIVE

## 2021-02-23 LAB — POC OCCULT BLOOD, ED: Fecal Occult Bld: POSITIVE — AB

## 2021-02-23 LAB — MAGNESIUM: Magnesium: 2.1 mg/dL (ref 1.7–2.4)

## 2021-02-23 LAB — ETHANOL: Alcohol, Ethyl (B): <10 mg/dL (ref <10)

## 2021-02-23 LAB — CBG MONITORING, ED
Glucose-Capillary: 89 mg/dL (ref 70–99)
Glucose-Capillary: 106 mg/dL — ABNORMAL HIGH (ref 70–99)
Glucose-Capillary: 94 mg/dL (ref 70–99)

## 2021-02-23 MED ORDER — FOLIC ACID 1 MG PO TABS
1.0000 mg | ORAL_TABLET | Freq: Every day | ORAL | Status: DC
  Administered 2021-02-23 – 2021-02-27 (×5): 1 mg via ORAL
  Filled 2021-02-23 (×6): qty 1

## 2021-02-23 MED ORDER — SODIUM THIOSULFATE 250 MG/ML IV SOLN
25.0000 g | INTRAVENOUS | Status: DC
  Administered 2021-02-28: 25 g via INTRAVENOUS
  Filled 2021-02-23 (×2): qty 100

## 2021-02-23 MED ORDER — SODIUM CHLORIDE 0.9 % IV SOLN: 10.0000 mL/h | Freq: Once | INTRAVENOUS | Status: DC

## 2021-02-23 MED ORDER — DARBEPOETIN ALFA 200 MCG/0.4ML IJ SOSY
200.0000 ug | PREFILLED_SYRINGE | INTRAMUSCULAR | Status: DC
  Administered 2021-02-26: 200 ug via INTRAVENOUS
  Filled 2021-02-23 (×2): qty 0.4

## 2021-02-23 MED ORDER — NEPRO/CARBSTEADY PO LIQD
237.0000 mL | Freq: Two times a day (BID) | ORAL | Status: DC
  Filled 2021-02-23: qty 237

## 2021-02-23 MED ORDER — ADULT MULTIVITAMIN W/MINERALS CH
1.0000 | ORAL_TABLET | Freq: Every day | ORAL | Status: DC
  Administered 2021-02-23 – 2021-02-25 (×3): 1 via ORAL
  Filled 2021-02-23 (×4): qty 1

## 2021-02-23 MED ORDER — CALCIUM ACETATE (PHOS BINDER) 667 MG PO CAPS: 1334.0000 mg | ORAL_CAPSULE | Freq: Three times a day (TID) | ORAL | Status: DC

## 2021-02-23 MED ORDER — DOXEPIN HCL 10 MG PO CAPS
10.0000 mg | ORAL_CAPSULE | Freq: Every day | ORAL | Status: DC
  Administered 2021-02-23 – 2021-02-27 (×5): 10 mg via ORAL
  Filled 2021-02-23 (×6): qty 1

## 2021-02-23 MED ORDER — AMLODIPINE BESYLATE 10 MG PO TABS
10.0000 mg | ORAL_TABLET | Freq: Every day | ORAL | Status: DC
  Administered 2021-02-24 – 2021-02-27 (×3): 10 mg via ORAL
  Filled 2021-02-23: qty 1
  Filled 2021-02-23: qty 2
  Filled 2021-02-23 (×2): qty 1

## 2021-02-23 MED ORDER — GABAPENTIN 100 MG PO CAPS
100.0000 mg | ORAL_CAPSULE | Freq: Every day | ORAL | Status: DC
  Administered 2021-02-23: 100 mg via ORAL
  Filled 2021-02-23: qty 1

## 2021-02-23 MED ORDER — SODIUM CHLORIDE 0.9% FLUSH: 3.0000 mL | INTRAVENOUS | Status: DC | PRN

## 2021-02-23 MED ORDER — SODIUM CHLORIDE 0.9 % IV SOLN: 250.0000 mL | INTRAVENOUS | Status: DC | PRN

## 2021-02-23 MED ORDER — NICOTINE 21 MG/24HR TD PT24
21.0000 mg | MEDICATED_PATCH | Freq: Every day | TRANSDERMAL | Status: DC
  Administered 2021-02-23 – 2021-02-28 (×6): 21 mg via TRANSDERMAL
  Filled 2021-02-23 (×8): qty 1

## 2021-02-23 MED ORDER — LORATADINE 10 MG PO TABS
10.0000 mg | ORAL_TABLET | Freq: Every day | ORAL | Status: DC
  Administered 2021-02-24 – 2021-02-27 (×4): 10 mg via ORAL
  Filled 2021-02-23 (×5): qty 1

## 2021-02-23 MED ORDER — THIAMINE HCL 100 MG PO TABS
100.0000 mg | ORAL_TABLET | Freq: Every day | ORAL | Status: DC
  Administered 2021-02-23 – 2021-02-27 (×5): 100 mg via ORAL
  Filled 2021-02-23 (×6): qty 1

## 2021-02-23 MED ORDER — OXYCODONE HCL 5 MG PO TABS
5.0000 mg | ORAL_TABLET | ORAL | Status: DC | PRN
  Administered 2021-02-24 – 2021-02-26 (×4): 5 mg via ORAL
  Filled 2021-02-23 (×4): qty 1

## 2021-02-23 MED ORDER — SUCRALFATE 1 G PO TABS
1.0000 g | ORAL_TABLET | Freq: Every day | ORAL | Status: DC
  Administered 2021-02-24 – 2021-02-27 (×4): 1 g via ORAL
  Filled 2021-02-23 (×5): qty 1

## 2021-02-23 MED ORDER — SODIUM CHLORIDE 0.9% FLUSH
3.0000 mL | Freq: Two times a day (BID) | INTRAVENOUS | Status: DC
  Administered 2021-02-23 – 2021-02-28 (×10): 3 mL via INTRAVENOUS

## 2021-02-23 MED ORDER — PANTOPRAZOLE SODIUM 40 MG IV SOLR
40.0000 mg | Freq: Two times a day (BID) | INTRAVENOUS | Status: DC
  Administered 2021-02-24 – 2021-02-26 (×4): 40 mg via INTRAVENOUS
  Filled 2021-02-23 (×5): qty 40

## 2021-02-23 MED ORDER — PANTOPRAZOLE 80MG IVPB - SIMPLE MED
80.0000 mg | Freq: Once | INTRAVENOUS | Status: AC
  Administered 2021-02-23: 80 mg via INTRAVENOUS
  Filled 2021-02-23: qty 80

## 2021-02-23 MED ORDER — GABAPENTIN 300 MG PO CAPS
300.0000 mg | ORAL_CAPSULE | Freq: Three times a day (TID) | ORAL | Status: DC
  Administered 2021-02-23 (×2): 300 mg via ORAL
  Filled 2021-02-23 (×2): qty 1

## 2021-02-23 MED ORDER — INSULIN ASPART 100 UNIT/ML IJ SOLN
0.0000 [IU] | Freq: Three times a day (TID) | INTRAMUSCULAR | Status: DC
Start: 2021-02-23 — End: 2021-02-28
  Administered 2021-02-25: 1 [IU] via SUBCUTANEOUS

## 2021-02-23 MED ORDER — HYDROXYZINE HCL 25 MG PO TABS
25.0000 mg | ORAL_TABLET | Freq: Two times a day (BID) | ORAL | Status: DC
  Administered 2021-02-23 – 2021-02-27 (×7): 25 mg via ORAL
  Filled 2021-02-23 (×8): qty 1

## 2021-02-23 MED ORDER — LANTHANUM CARBONATE 500 MG PO CHEW
1000.0000 mg | CHEWABLE_TABLET | Freq: Three times a day (TID) | ORAL | Status: DC
  Administered 2021-02-24 – 2021-02-28 (×9): 1000 mg via ORAL
  Filled 2021-02-23 (×16): qty 2

## 2021-02-23 NOTE — ED Notes (Signed)
Pt states d/t his dialysis he does not make any urine, so unable to provide sample for urine drug screen.

## 2021-02-23 NOTE — Consult Note (Signed)
[facility] Renal Consultation Note  Indication for Consultation:  Management of ESRD/hemodialysis; anemia, hypertension/volume and secondary hyperparathyroidism  HPI: Daniel [name] is a 60 y.o. male a past medical history of ESRD on HD TTS, noncompliant with outpatient dialysis missed this past Thursday the 27th, calciphylaxis left lower extremity ulcer, PAD, alcohol use, history of otitis media, history of cocaine use, history of recent admit 10/13 -15/2022 recurrent upper GI bleed with acute blood loss prior gastric ulcer on EGD 02/09/21.  Today presented for outpatient HD complaining of weakness, continued liquidy dark black stool with abdominal pain with 1.5 hours of HD became semiconscious with low blood pressure thus EMS was called for transfer to ER for eval. presentation the ER BP 140/81, O2 sat 100% afebrile, ER labs hemoglobin 5.0 plt 137 fecal occult positive BUN 44 , K3.7, chest x-ray centimeters no signs of pulmonary edema or focal consolidation.,  COVID-negative. Currently denies shortness of breath, chest pain ,abdominal pain, just having weakness.      Past Medical History:  Diagnosis Date   Anemia    Diabetes mellitus without complication [facility])    patient denies   Dialysis patient [facility])    End stage chronic kidney disease ([place])    Hypertension    ICH (intracerebral hemorrhage) ([place]) 05/20/2017   Shoulder pain, left 06/28/2013    Past Surgical History:  Procedure Laterality Date   A/V FISTULAGRAM N/A 08/15/2020   Procedure: A/V FISTULAGRAM - Left Upper;  Surgeon: Cherre Robins, MD;  Location: [facility];  Service: Cardiovascular;  Laterality: N/A;   APPENDECTOMY     AV FISTULA PLACEMENT Left 08/03/2019   Procedure: LEFT ARM ARTERIOVENOUS (AV) CEPHALIC  FISTULA CREATION;  Surgeon: Waynetta Sandy, MD;  Location: [place];  Service: Vascular;  Laterality: Left;   BIOPSY  06/30/2019   Procedure: BIOPSY;  Surgeon: Ronald Lobo, MD;   Location: [place];  Service: Endoscopy;;   BIOPSY  08/02/2019   Procedure: BIOPSY;  Surgeon: Yetta Flock, MD;  Location: [place];  Service: Gastroenterology;;   BIOPSY  02/08/2021   Procedure: BIOPSY;  Surgeon: Sharyn Creamer, MD;  Location: [place];  Service: Gastroenterology;;   COLONOSCOPY  01/23/2012   Procedure: COLONOSCOPY;  Surgeon: Danie Binder, MD;  [facility];  Service: Endoscopy;  Laterality: N/A;  11:10 AM   COLONOSCOPY WITH PROPOFOL N/A 06/30/2019   Procedure: COLONOSCOPY WITH PROPOFOL;  Surgeon: Ronald Lobo, MD;  Location: [place];  Service: Endoscopy;  Laterality: N/A;   ENTEROSCOPY N/A 08/02/2019   Procedure: ENTEROSCOPY;  Surgeon: Yetta Flock, MD;  Location: [facility];  Service: Gastroenterology;  Laterality: N/A;   ESOPHAGOGASTRODUODENOSCOPY N/A 08/10/2020   Procedure: ESOPHAGOGASTRODUODENOSCOPY (EGD);  Surgeon: Jerene Bears, MD;  Location: [facility] ENDOSCOPY;  Service: Gastroenterology;  Laterality: N/A;   ESOPHAGOGASTRODUODENOSCOPY (EGD) WITH PROPOFOL N/A 06/30/2019   Procedure: ESOPHAGOGASTRODUODENOSCOPY (EGD) WITH PROPOFOL;  Surgeon: Ronald Lobo, MD;  Location: [place];  Service: Endoscopy;  Laterality: N/A;   ESOPHAGOGASTRODUODENOSCOPY (EGD) WITH PROPOFOL N/A 01/12/2021   Procedure: ESOPHAGOGASTRODUODENOSCOPY (EGD) WITH PROPOFOL;  Surgeon: Sharyn Creamer, MD;  Location: [place];  Service: Gastroenterology;  Laterality: N/A;   ESOPHAGOGASTRODUODENOSCOPY (EGD) WITH PROPOFOL N/A 02/08/2021   Procedure: ESOPHAGOGASTRODUODENOSCOPY (EGD) WITH PROPOFOL;  Surgeon: Sharyn Creamer, MD;  Location: [place];  Service: Gastroenterology;  Laterality: N/A;   FISTULA SUPERFICIALIZATION Left 10/17/2019   Procedure: LEFT UPPER EXTREMITY FISTULA REVISION, SIDE BRANCH LIGATION,  AND SUPERFICIALIZATION;  Surgeon: Marty Heck,  MD;  Location: [place];  Service: Vascular;  Laterality: Left;   GIVENS CAPSULE STUDY N/A 06/30/2019    Procedure: GIVENS CAPSULE STUDY;  Surgeon: Ronald Lobo, MD;  Location: [place];  Service: Endoscopy;  Laterality: N/A;   HEMOSTASIS CLIP PLACEMENT  08/10/2020   Procedure: HEMOSTASIS CLIP PLACEMENT;  Surgeon: Jerene Bears, MD;  Location: [facility] ENDOSCOPY;  Service: Gastroenterology;;   HEMOSTASIS CLIP PLACEMENT  01/12/2021   Procedure: HEMOSTASIS CLIP PLACEMENT;  Surgeon: Sharyn Creamer, MD;  Location[facility] END[facility];  Service: Gastroenterology;;   HEMOSTASIS CONTROL  08/02/2019   Procedure: HEMOSTASIS CONTROL;  Surgeon: Yetta Flock, MD;  Location: [facility] ENDOSCOPY;  Service: Gastroenterology;;   INCISION AND DRAINAGE ABSCESS N/A 06/29/2016   Procedure: INCISION AND DRAINAGE ABDOMINAL WALL ABSCESS;  Surgeon: Alphonsa Overall, MD;  Location: [facility];  Service: General;  Laterality: N/A;   INSERTION OF DIALYSIS CATHETER Right 08/03/2019   Procedure: INSERTION OF DIALYSIS CATHETER;  Surgeon: Waynetta Sandy, MD;  Location: [place];  Service: Vascular;  Laterality: Right;   INSERTION OF DIALYSIS CATHETER Right 10/22/2019   Procedure: INSERTION OF 23CM TUNNELED DIALYSIS CATHETER RIGHT INTERNAL JUGULAR;  Surgeon: Angelia Mould, MD;  Location: [place];  Service: Vascular;  Laterality: Right;   INSERTION OF DIALYSIS CATHETER Right 08/12/2020   Procedure: INSERTION OF Right internal Jugular TUNNELED  DIALYSIS CATHETER.;  Surgeon: Waynetta Sandy, MD;  Location: [place];  Service: Vascular;  Laterality: Right;   Left heel surgery     PENILE BIOPSY N/A 03/26/2020   Procedure: PENILE ULCER DEBRIDEMENT;  Surgeon: Remi Haggard, MD;  Location: [facility];  Service: Urology;  Laterality: N/A;  [place]  01/12/2021   Procedure: SCLEROTHERAPY;  Surgeon: Sharyn Creamer, MD;  Location[facility];  Service: Gastroenterology;;      Family History  Problem Relation Age of Onset   Colon cancer Neg Hx       reports that he has been smoking cigarettes. He has a 30.00 pack-year  smoking history. He has never used smokeless tobacco. He reports that he does not currently use alcohol after a past usage of about 12.0 standard drinks per week. He reports that he does not currently use drugs after having used the following drugs: "Crack" cocaine.   Allergies  Allergen Reactions   Dilaudid [Hydromorphone Hcl] Itching and Other (See Comments)    Pt reports itchiness after IM injection     Prior to Admission medications   Medication Sig Start Date End Date Taking? Authorizing Provider  amLODipine (NORVASC) 10 MG tablet Take 10 mg by mouth in the morning and at bedtime. 01/01/21  Yes [provider]  blood glucose meter kit and supplies KIT Dispense based on patient and insurance preference. Use up to four times daily as directed. (FOR ICD-9 250.00, 250.01). 07/01/16  Yes Nita Sells, MD  Calcium Acetate 667 MG TABS Take 1,334 mg by mouth in the morning, at noon, and at bedtime. 09/11/20  Yes [provider]  cetirizine (ZYRTEC ALLERGY) 10 MG tablet Take 1 tablet (10 mg total) by mouth daily as needed for allergies. 08/22/20  Yes [name], NP  cloNIDine (CATAPRES) 0.2 MG tablet Take  1 tablet twice daily . Patient taking differently: Take 0.2 mg by mouth 2 (two) times daily. Take  1 tablet twice daily . 10/28/20  Yes Donnamae Jude, MD  doxepin (SINEQUAN) 10 MG capsule Take 10 mg by mouth at bedtime. 01/03/21  Yes [provider]  gabapentin (NEURONTIN) 100 MG capsule Take 1 capsule (100 mg total) by mouth at bedtime. 08/16/20  Yes Florencia Reasons, MD  gabapentin (NEURONTIN) 300 MG capsule Take 300 mg by mouth in the morning, at noon, and at bedtime. 02/04/21  Yes [provider]  hydrOXYzine (ATARAX/VISTARIL) 25 MG tablet TAKE 1 TABLET BY MOUTH EVERY 8 HOURS AS NEEDED FOR ANXIETY OR  ITCHING Patient taking differently: Take 25 mg by mouth in the morning and at bedtime. 11/07/20  Yes [name], NP  lidocaine-prilocaine (EMLA) cream  Apply 1 application topically once. 12/27/20  Yes [provider]  pantoprazole (PROTONIX) 40 MG tablet Take 1 tablet (40 mg total) by mouth 2 (two) times daily. 02/09/21  Yes Ghimire, Henreitta Leber, MD  sucralfate (CARAFATE) 1 g tablet Take 1 g by mouth daily. 01/30/21  Yes [provider]  colchicine 0.6 MG tablet Take 0.5 tablets (0.3 mg total) by mouth 2 (two) times daily. 07/24/20 07/29/20  Noemi Chapel, MD  ferrous sulfate 325 (65 FE) MG tablet SMARTSIG:1 Tablet(s) By Mouth Twice Daily 08/11/19 02/09/20  [provider]  furosemide (LASIX) 40 MG tablet Take 1 tablet (40 mg total) by mouth daily. 07/25/19 02/09/20  Ladona Horns, MD      .  ROS: See HPI   Physical Exam: Vitals:   02/23/21 1245 02/23/21 1330  BP: (!) 145/79 (!) 144/49  Pulse: (!) 101 93  Resp: 20 18  Temp:    SpO2: 97% 97%     General: Alert obese African-American male NAD HEENT: [place], EOMI, nonicteric, MMM Neck: No JVD supple Heart: RRR, no MRG Lungs: CTA nonlabored breathing Abdomen: Obese, NABS, TTP /epigastric area, no rebound Extremities: Bilateral pedal edema trace Skin: Hyperpigmented macular rash, left calf with dark ulcerated lesion Neuro: Alert O x3 no acute focal deficits appreciated Dialysis Access: Positive bruit LUA aVF  Dialysis Orders: Center: [facility], TTS 4-hour, EDW 131 kg, [place] 2K 2 CA No heparin  Mircera 225 MCG every 2 weeks, last dose 01/29/2021 Hectorol 1 mcg q. HD Sodium thiosulfate 25 g q. dialysis  L UA  aVF    Assessment/Plan Symptomatic anemia with recurrent GI bleed= Hgb 5.0 on admit =per admit planning transfusion of packed red blood cells, serial Hgb's, no heparin with dialysis ESRD -HD TTS we will hold next dialysis until Tuesday follow-up labs Hypertension/volume  -reported hypotension at center, at hospital stable BP, on listed home BP meds of clonidine 0.2 mg twice daily, amlodipine 10 mg at bedtime, will hold clonidine just use amlodipine for now  follow-up BP trend, chest x-ray showing no excess fluid has some on exam but not needed for HD today Anemia of ESRD and GI bleed-transfusion as #1, on max ESA as outpatient ,next dose Mircera next dialysis 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  Last outpatient phosphorus was 7, also DC vitamin D with calciphylaxis Nutrition -albumin 2.6, protein supplements Transaminitis-, history of chronic hep C /history of alcohol use ethanol level pending Diabetes mellitus type 2 =per admit   Ernest Haber, PA-C [place] [phone] 02/23/2021, 2:43 PM

## 2021-02-23 NOTE — ED Triage Notes (Signed)
Coming from HD general maliase and weakness and dark stool with abd pain x  1-2 months was d/c from the hospital for this same complaint recently thinks within the last 2 weeks. On completed 1/2 HD, they states he was unresponsive but on ems arrival awake and alert and denied LOC.

## 2021-02-23 NOTE — H&P (View-Only) (Signed)
Consultation  Referring Provider:    Dr. Joya Gaskins Primary Care Physician:  [name], NP Primary Gastroenterologist:     Althia Forts    Reason for Consultation:     Melena, anemia         HPI:   Daniel [name] is a 60 y.o. male with a history of end-stage renal disease on dialysis (Tuesday/Thursday/Saturday), hypertension, diabetes and a history of recurrent upper GI bleeds who presented to the ER today with recurrent symptoms of melena and symptomatic anemia (fatigue, weakness, dyspnea).  He was found to have a hemoglobin of 5 which is down from 8 on October 17.  He was admitted in mid September and again in mid October with identical symptoms (melena, weakness and acute drop in hemoglobin).  An upper endoscopy on his admission in September was notable for a cratered antral ulcer treated with epinephrine and Hemoclip.  This was deemed to be the source of his bleeding.  Gastric biopsies were taken and were positive for H. pylori and he was prescribed quadruple therapy which she completed.  The upper endoscopy on October 14 was not positive for definitive bleeding source.  There was a small antral ulcer without stigmata and the previously treated prepyloric ulcer appeared to have resolved.  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  Capsule study in that year did not show AVMs deep in the small intestine  The patient denies taking any NSAIDs.  He denied any alcohol use to me.  He does smoke cigarettes.  He reports generalized abdominal discomfort and decreased appetite over the last few days.  He has been passing melenic stools for over a week now, usually about 3/day.  Stools are black as tar per the patient.  No hematemesis, no hematochezia.  He reports taking his Protonix as directed.  Past Medical History:  Diagnosis Date   Anemia    Diabetes mellitus without complication [facility])    patient denies   Dialysis patient [facility])    End stage  chronic kidney disease ([place])    Hypertension    ICH (intracerebral hemorrhage) ([place]) 05/20/2017   Shoulder pain, left 06/28/2013    Past Surgical History:  Procedure Laterality Date   A/V FISTULAGRAM N/A 08/15/2020   Procedure: A/V FISTULAGRAM - Left Upper;  Surgeon: Cherre Robins, MD;  Location: [place] [facility];  Service: Cardiovascular;  Laterality: N/A;   APPENDECTOMY     AV FISTULA PLACEMENT Left 08/03/2019   Procedure: LEFT ARM ARTERIOVENOUS (AV) CEPHALIC  FISTULA CREATION;  Surgeon: Waynetta Sandy, MD;  Location: [place];  Service: Vascular;  Laterality: Left;   BIOPSY  06/30/2019   Procedure: BIOPSY;  Surgeon: Ronald Lobo, MD;  Location: [place];  Service: Endoscopy;;   BIOPSY  08/02/2019   Procedure: BIOPSY;  Surgeon: Yetta Flock, MD;  Location: [place];  Service: Gastroenterology;;   BIOPSY  02/08/2021   Procedure: BIOPSY;  Surgeon: Sharyn Creamer, MD;  Location: [place];  Service: Gastroenterology;;   COLONOSCOPY  01/23/2012   Procedure: COLONOSCOPY;  Surgeon: Danie Binder, MD;  [facility];  Service: Endoscopy;  Laterality: N/A;  11:10 AM   COLONOSCOPY WITH PROPOFOL N/A 06/30/2019   Procedure: COLONOSCOPY WITH PROPOFOL;  Surgeon: Ronald Lobo, MD;  Location: [place];  Service: Endoscopy;  Laterality: N/A;   ENTEROSCOPY N/A 08/02/2019   Procedure: ENTEROSCOPY;  Surgeon: Yetta Flock, MD;  Location: [place] [facility];  Service:  Gastroenterology;  Laterality: N/A;   ESOPHAGOGASTRODUODENOSCOPY N/A 08/10/2020   Procedure: ESOPHAGOGASTRODUODENOSCOPY (EGD);  Surgeon: Jerene Bears, MD;  Location: [facility] END[facility]COPY;  Service: Gastroenterology;  Laterality: N/A;   ESOPHAGOGASTRODUODENOSCOPY (EGD) WITH PROPOFOL N/A 06/30/2019   Procedure: ESOPHAGOGASTRODUODENOSCOPY (EGD) WITH PROPOFOL;  Surgeon: Ronald Lobo, MD;  Location: [place];  Service: Endoscopy;  Laterality: N/A;   ESOPHAGOGASTRODUODENOSCOPY (EGD) WITH PROPOFOL N/A 01/12/2021    Procedure: ESOPHAGOGASTRODUODENOSCOPY (EGD) WITH PROPOFOL;  Surgeon: Sharyn Creamer, MD;  Location: [place];  Service: Gastroenterology;  Laterality: N/A;   ESOPHAGOGASTRODUODENOSCOPY (EGD) WITH PROPOFOL N/A 02/08/2021   Procedure: ESOPHAGOGASTRODUODENOSCOPY (EGD) WITH PROPOFOL;  Surgeon: Sharyn Creamer, MD;  Location: [place];  Service: Gastroenterology;  Laterality: N/A;   FISTULA SUPERFICIALIZATION Left 10/17/2019   Procedure: LEFT UPPER EXTREMITY FISTULA REVISION, SIDE BRANCH LIGATION,  AND SUPERFICIALIZATION;  Surgeon: Marty Heck, MD;  Location: [place];  Service: Vascular;  Laterality: Left;   GIVENS CAPSULE STUDY N/A 06/30/2019   Procedure: GIVENS CAPSULE STUDY;  Surgeon: Ronald Lobo, MD;  Location: [place];  Service: Endoscopy;  Laterality: N/A;   HEMOSTASIS CLIP PLACEMENT  08/10/2020   Procedure: HEMOSTASIS CLIP PLACEMENT;  Surgeon: Jerene Bears, MD;  Location: [facility] ENDOSCOPY;  Service: Gastroenterology;;   HEMOSTASIS CLIP PLACEMENT  01/12/2021   Procedure: HEMOSTASIS CLIP PLACEMENT;  Surgeon: Sharyn Creamer, MD;  Location: [facility] ENDOSCOPY;  Service: Gastroenterology;;   HEMOSTASIS CONTROL  08/02/2019   Procedure: HEMOSTASIS CONTROL;  Surgeon: Yetta Flock, MD;  Location: [facility] ENDOSCOPY;  Service: Gastroenterology;;   INCISION AND DRAINAGE ABSCESS N/A 06/29/2016   Procedure: INCISION AND DRAINAGE ABDOMINAL WALL ABSCESS;  Surgeon: Alphonsa Overall, MD;  Location: [facility];  Service: General;  Laterality: N/A;   INSERTION OF DIALYSIS CATHETER Right 08/03/2019   Procedure: INSERTION OF DIALYSIS CATHETER;  Surgeon: Waynetta Sandy, MD;  Location: [place];  Service: Vascular;  Laterality: Right;   INSERTION OF DIALYSIS CATHETER Right 10/22/2019   Procedure: INSERTION OF 23CM TUNNELED DIALYSIS CATHETER RIGHT INTERNAL JUGULAR;  Surgeon: Angelia Mould, MD;  Location: [place];  Service: Vascular;  Laterality: Right;   INSERTION OF DIALYSIS CATHETER Right 08/12/2020    Procedure: INSERTION OF Right internal Jugular TUNNELED  DIALYSIS CATHETER.;  Surgeon: Waynetta Sandy, MD;  Location: [place];  Service: Vascular;  Laterality: Right;   Left heel surgery     PENILE BIOPSY N/A 03/26/2020   Procedure: PENILE ULCER DEBRIDEMENT;  Surgeon: Remi Haggard, MD;  Location: [facility];  Service: Urology;  Laterality: N/A;  Organ  01/12/2021   Procedure: SCLEROTHERAPY;  Surgeon: Sharyn Creamer, MD;  Location: [facility] ENDOSCOPY;  Service: Gastroenterology;;    Family History  Problem Relation Age of Onset   Colon cancer Neg Hx      Social History   Tobacco Use   Smoking status: Every Day    Packs/day: 1.00    Years: 30.00    Pack years: 30.00    Types: Cigarettes   Smokeless tobacco: Never  Vaping Use   Vaping Use: Never used  Substance Use Topics   Alcohol use: Not Currently    Alcohol/week: 12.0 standard drinks    Types: 12 Cans of beer per week   Drug use: Not Currently    Types: "Crack" cocaine    Comment: last in 2020    Prior to Admission medications   Medication Sig Start Date End Date Taking? Authorizing Provider  amLODipine (NORVASC) 10 MG tablet Take 10  mg by mouth in the morning and at bedtime. 01/01/21  Yes [provider]  blood glucose meter kit and supplies KIT Dispense based on patient and insurance preference. Use up to four times daily as directed. (FOR ICD-9 250.00, 250.01). 07/01/16  Yes Nita Sells, MD  Calcium Acetate 667 MG TABS Take 1,334 mg by mouth in the morning, at noon, and at bedtime. 09/11/20  Yes [provider]  cetirizine (ZYRTEC ALLERGY) 10 MG tablet Take 1 tablet (10 mg total) by mouth daily as needed for allergies. 08/22/20  Yes [name], NP  cloNIDine (CATAPRES) 0.2 MG tablet Take  1 tablet twice daily . Patient taking differently: Take 0.2 mg by mouth 2 (two) times daily. Take  1 tablet twice daily . 10/28/20  Yes Donnamae Jude, MD  doxepin (SINEQUAN) 10 MG capsule  Take 10 mg by mouth at bedtime. 01/03/21  Yes [provider]  gabapentin (NEURONTIN) 100 MG capsule Take 1 capsule (100 mg total) by mouth at bedtime. 08/16/20  Yes Florencia Reasons, MD  gabapentin (NEURONTIN) 300 MG capsule Take 300 mg by mouth in the morning, at noon, and at bedtime. 02/04/21  Yes [provider]  hydrOXYzine (ATARAX/VISTARIL) 25 MG tablet TAKE 1 TABLET BY MOUTH EVERY 8 HOURS AS NEEDED FOR ANXIETY OR  ITCHING Patient taking differently: Take 25 mg by mouth in the morning and at bedtime. 11/07/20  Yes [name], NP  lidocaine-prilocaine (EMLA) cream Apply 1 application topically once. 12/27/20  Yes [provider]  pantoprazole (PROTONIX) 40 MG tablet Take 1 tablet (40 mg total) by mouth 2 (two) times daily. 02/09/21  Yes Ghimire, Henreitta Leber, MD  sucralfate (CARAFATE) 1 g tablet Take 1 g by mouth daily. 01/30/21  Yes [provider]  colchicine 0.6 MG tablet Take 0.5 tablets (0.3 mg total) by mouth 2 (two) times daily. 07/24/20 07/29/20  Noemi Chapel, MD  ferrous sulfate 325 (65 FE) MG tablet SMARTSIG:1 Tablet(s) By Mouth Twice Daily 08/11/19 02/09/20  [provider]  furosemide (LASIX) 40 MG tablet Take 1 tablet (40 mg total) by mouth daily. 07/25/19 02/09/20  Ladona Horns, MD    Current Facility-Administered Medications  Medication Dose Route Frequency Provider Last Rate Last Admin   0.9 %  sodium chloride infusion  10 mL/hr Intravenous Once Valarie Merino, MD   Held at 02/23/21 1219   0.9 %  sodium chloride infusion  250 mL Intravenous PRN Orma Flaming, MD       [START ON 02/24/2021] amLODipine (NORVASC) tablet 10 mg  10 mg Oral Daily Orma Flaming, MD       calcium acetate (PHOSLO) capsule 1,334 mg  1,334 mg Oral TID with meals Orma Flaming, MD       [START ON 02/26/2021] Darbepoetin Alfa (ARANESP) injection 200 mcg  200 mcg Intravenous Q Tue-HD Ernest Haber, PA-C       doxepin (SINEQUAN) capsule 10 mg  10 mg Oral QHS Orma Flaming, MD       [START ON 02/24/2021] feeding supplement (NEPRO CARB STEADY) liquid 237 mL  237 mL Oral BID BM Zeyfang, David, PA-C       folic acid (FOLVITE) tablet 1 mg  1 mg Oral Daily Orma Flaming, MD   1 mg at 02/23/21 1349   gabapentin (NEURONTIN) capsule 100 mg  100 mg Oral QHS Orma Flaming, MD       gabapentin (NEURONTIN) capsule 300 mg  300 mg Oral TID Orma Flaming, MD  300 mg at 02/23/21 1742   hydrOXYzine (ATARAX/VISTARIL) tablet 25 mg  25 mg Oral BID Orma Flaming, MD       insulin aspart (novoLOG) injection 0-6 Units  0-6 Units Subcutaneous TID WC Orma Flaming, MD       lanthanum [facility]) chewable tablet 1,000 mg  1,000 mg Oral TID WC Ernest Haber, PA-C       [START ON 02/24/2021] loratadine (CLARITIN) tablet 10 mg  10 mg Oral Daily Orma Flaming, MD       multivitamin with minerals tablet 1 tablet  1 tablet Oral Daily Orma Flaming, MD   1 tablet at 02/23/21 1349   nicotine (NICODERM CQ - dosed in mg/24 hours) patch 21 mg  21 mg Transdermal Daily Orma Flaming, MD   21 mg at 02/23/21 1742   oxyCODONE (Oxy IR/ROXICODONE) immediate release tablet 5 mg  5 mg Oral Q4H PRN Orma Flaming, MD       [START ON 02/24/2021] pantoprazole (PROTONIX) injection 40 mg  40 mg Intravenous Q12H Orma Flaming, MD       sodium chloride flush (NS) 0.9 % injection 3 mL  3 mL Intravenous Q12H Orma Flaming, MD   3 mL at 02/23/21 1348   sodium chloride flush (NS) 0.9 % injection 3 mL  3 mL Intravenous PRN Orma Flaming, MD       Derrill Memo ON 02/26/2021] sodium thiosulfate 25 g in sodium chloride 0.9 % 200 mL Infusion for Calciphylaxis  25 g Intravenous Q T,Th,Sa-HD Ernest Haber, PA-C       sucralfate (CARAFATE) tablet 1 g  1 g Oral Daily Orma Flaming, MD       thiamine tablet 100 mg  100 mg Oral Daily Orma Flaming, MD   100 mg at 02/23/21 1349   Current Outpatient Medications  Medication Sig Dispense Refill   amLODipine (NORVASC) 10 MG tablet Take 10 mg by mouth in the morning and  at bedtime.     blood glucose meter kit and supplies KIT Dispense based on patient and insurance preference. Use up to four times daily as directed. (FOR ICD-9 250.00, 250.01). 1 each 0   Calcium Acetate 667 MG TABS Take 1,334 mg by mouth in the morning, at noon, and at bedtime.     cetirizine (ZYRTEC ALLERGY) 10 MG tablet Take 1 tablet (10 mg total) by mouth daily as needed for allergies. 90 tablet 1   cloNIDine (CATAPRES) 0.2 MG tablet Take  1 tablet twice daily . (Patient taking differently: Take 0.2 mg by mouth 2 (two) times daily. Take  1 tablet twice daily .) 180 tablet 1   doxepin (SINEQUAN) 10 MG capsule Take 10 mg by mouth at bedtime.     gabapentin (NEURONTIN) 100 MG capsule Take 1 capsule (100 mg total) by mouth at bedtime. 30 capsule 0   gabapentin (NEURONTIN) 300 MG capsule Take 300 mg by mouth in the morning, at noon, and at bedtime.     hydrOXYzine (ATARAX/VISTARIL) 25 MG tablet TAKE 1 TABLET BY MOUTH EVERY 8 HOURS AS NEEDED FOR ANXIETY OR  ITCHING (Patient taking differently: Take 25 mg by mouth in the morning and at bedtime.) 90 tablet 0   lidocaine-prilocaine (EMLA) cream Apply 1 application topically once.     pantoprazole (PROTONIX) 40 MG tablet Take 1 tablet (40 mg total) by mouth 2 (two) times daily. 60 tablet 2   sucralfate (CARAFATE) 1 g tablet Take 1 g by mouth daily.      Allergies as  of 02/23/2021 - Review Complete 02/23/2021  Allergen Reaction Noted   Dilaudid [hydromorphone hcl] Itching and Other (See Comments) 07/29/2020     Review of Systems:    As per HPI, otherwise negative    Physical Exam:  Vital signs in last 24 hours: Temp:  [98.2 F (36.8 C)-98.3 F (36.8 C)] 98.2 F (36.8 C) (10/29 1613) Pulse Rate:  [87-101] 87 (10/29 1745) Resp:  [14-25] 21 (10/29 1745) BP: (84-190)/(45-109) 128/86 (10/29 1745) SpO2:  [94 %-100 %] 97 % (10/29 1745) Weight:  [104.3 kg] 104.3 kg (10/29 0936)   General:   Pleasant African-American male in NAD, lying in  bed Head:  Normocephalic and atraumatic. Eyes:   No icterus.   Conjunctiva pink. Ears:  Normal auditory acuity. Neck:  Supple Lungs:  Respirations even and unlabored. Lungs clear to auscultation bilaterally.   No wheezes, crackles, or rhonchi.  Heart:  Regular rate and rhythm; soft systolic murmur at left upper sternal border Abdomen:  Soft, obese, nondistended, nontender. Normal bowel sounds. No appreciable masses or hepatomegaly.  Rectal:  Not performed.  Msk:  Symmetrical without gross deformities.  Extremities:  Without edema. Neurologic:  Alert and  oriented x4;  grossly normal neurologically. Skin: Numerous hyperpigmented lesions on lower extremities and chest.  Some lesions in legs with crusting Psych:  Alert and cooperative. Normal affect.  LAB RESULTS: Recent Labs    02/23/21 1002 02/23/21 1520  WBC 5.4 4.8  HGB 5.0* 4.8*  HCT 15.7* 14.9*  PLT 137* 129*   BMET Recent Labs    02/23/21 0948  NA 140  K 3.7  CL 96*  CO2 29  GLUCOSE 98  BUN 44*  CREATININE 9.33*  CALCIUM 8.2*   LFT Recent Labs    02/23/21 0948  PROT 7.5  ALBUMIN 2.6*  AST 79*  ALT 24  ALKPHOS 69  BILITOT 0.9   PT/INR No results for input(s): LABPROT, INR in the last 72 hours.  STUDIES: DG Chest Port 1 View  Result Date: 02/23/2021 CLINICAL DATA:  Syncope EXAM: PORTABLE CHEST 1 VIEW COMPARISON:  Previous studies including the examination of 02/07/2021 FINDINGS: Transverse diameter of heart is increased. There are no signs of pulmonary edema. There is no focal pulmonary consolidation. There is no significant pleural effusion or pneumothorax. IMPRESSION: Cardiomegaly. There are no signs of pulmonary edema or focal pulmonary consolidation. Electronically Signed   By: Elmer Picker M.D.   On: 02/23/2021 11:08     PREVIOUS ENDOSCOPIES:            March 2021 Colonoscopy and EGD for anemia and FOBT Colonoscopy --complete exam, good prep --No active bleeding or source of heme positive  stool/anemia seen. - Diverticulosis in the sigmoid colon. - Probable old blood in the terminal ileum, raising the question of a small bowel source (given negative egd). - The distal rectum and anal verge are normal on retroflexion view. - No specimens collected. EGD - Normal larynx. - Normal esophagus. - Normal stomach. - Duodenal polyp vs. prominent papilla, benign-appearing. Biopsied. FINAL MICROSCOPIC DIAGNOSIS:   A. DUODENUM, BIOPSY:  - Patchy chronic duodenitis with surface gastric foveolar metaplasia,  suggestive of peptic duodenitis   B. DUODENUM, BIOPSY:  - Patchy chronic duodenitis with surface gastric foveolar metaplasia,  suggestive of peptic duodenitis    March 2021 Small bowel video capsule study --Normal study . No blood seen in 13 hours    08/02/19 Small bowel enteroscopy for intermittent dark stools.  --Esophagogastric landmarks identified. - Normal  esophagus. - Normal stomach. - Prominent ampulla without overt adenomatous changes. Biopsied. - A few benign appearing duodenal polyps. Biopsied, one suspected AVM treated as above. 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 Treated with argon plasma coagulation (APC). - A single non-bleeding angiodysplastic lesion in the proximal jejunum. Treated with argon plasma coagulation (APC).   FINAL MICROSCOPIC DIAGNOSIS:   A. AMPULLA, BIOPSY:  - Pyloric gland adenoma  - Negative for cytologic dysplasia in submitted biopsies   B. DUODENUM BULB, BIOPSY:  - Duodenal mucosa with mild reactive changes  - Negative for dysplasia, increased intraepithelial lymphocytes or  villous architectural changes      08/10/20 EGD for melena  --normal esophagus --Gastriits --Bleeding duodenal ulcer with a visible vessel. Clips were placed x 3 with apparent hemostasis. - Duodenitis with nodularity and polypoid mucosa. - No specimens collected   EGD Sept 17, 2022 -Oozing prepyloric gastric ulcer, treated with  epinephrine and Hemoclip x2 - H. pylori positive gastritis  EGD February 08, 2021 -Small clean-based antral ulcer -Previously placed hemoclips prolapsed into duodenal bulb - No evidence of bleeding and no stigmata -repeat biopsies negative for H. 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  He is hemodynamically stable and is receiving 2 units PRBCs currently.  This presentation is highly consistent with recurrent upper GI bleed, but given his recent upper endoscopy with no high risk ulcers, I am more suspicious for AVMs as the source of this bleed.  Given that the AVMs were located in the duodenum and jejunum last year, I will plan to repeat a push enteroscopy reassess for recurrent small bowel AVMs.  He was also noted to have a prominent ampulla with biopsy showing a pyloric gland adenoma in 2021.  It does not appear that this has been resected (patient did not follow up as outpatient).  We will reassess for this tomorrow as well.  Upper GI bleed (melena, acute blood loss anemia) - Enteroscopy tomorrow morning - Protonix 40 mg IV twice daily - Clear liquid diet tonight, n.p.o. at 3 AM - Transfuse to maintain hemoglobin greater than 7 - CBC posttransfusion and in a.m. - Reassess ampullary pyloric gland adenoma     LOS: 0 days   [name]  02/23/2021, 5:47 PM

## 2021-02-23 NOTE — Consult Note (Signed)
Consultation  Referring Provider:    Dr. Joya Gaskins Primary Care Physician:  [name], NP Primary Gastroenterologist:     Althia Forts    Reason for Consultation:     Melena, anemia         HPI:   Daniel [name] is a 60 y.o. male with a history of end-stage renal disease on dialysis (Tuesday/Thursday/Saturday), hypertension, diabetes and a history of recurrent upper GI bleeds who presented to the ER today with recurrent symptoms of melena and symptomatic anemia (fatigue, weakness, dyspnea).  He was found to have a hemoglobin of 5 which is down from 8 on October 17.  He was admitted in mid September and again in mid October with identical symptoms (melena, weakness and acute drop in hemoglobin).  An upper endoscopy on his admission in September was notable for a cratered antral ulcer treated with epinephrine and Hemoclip.  This was deemed to be the source of his bleeding.  Gastric biopsies were taken and were positive for H. pylori and he was prescribed quadruple therapy which she completed.  The upper endoscopy on October 14 was not positive for definitive bleeding source.  There was a small antral ulcer without stigmata and the previously treated prepyloric ulcer appeared to have resolved.  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  Capsule study in that year did not show AVMs deep in the small intestine  The patient denies taking any NSAIDs.  He denied any alcohol use to me.  He does smoke cigarettes.  He reports generalized abdominal discomfort and decreased appetite over the last few days.  He has been passing melenic stools for over a week now, usually about 3/day.  Stools are black as tar per the patient.  No hematemesis, no hematochezia.  He reports taking his Protonix as directed.  Past Medical History:  Diagnosis Date   Anemia    Diabetes mellitus without complication [facility])    patient denies   Dialysis patient [facility])    End stage  chronic kidney disease ([place])    Hypertension    ICH (intracerebral hemorrhage) ([place]) 05/20/2017   Shoulder pain, left 06/28/2013    Past Surgical History:  Procedure Laterality Date   A/V FISTULAGRAM N/A 08/15/2020   Procedure: A/V FISTULAGRAM - Left Upper;  Surgeon: Cherre Robins, MD;  Location: [place] [facility];  Service: Cardiovascular;  Laterality: N/A;   APPENDECTOMY     AV FISTULA PLACEMENT Left 08/03/2019   Procedure: LEFT ARM ARTERIOVENOUS (AV) CEPHALIC  FISTULA CREATION;  Surgeon: Waynetta Sandy, MD;  Location: [place];  Service: Vascular;  Laterality: Left;   BIOPSY  06/30/2019   Procedure: BIOPSY;  Surgeon: Ronald Lobo, MD;  Location: [place];  Service: Endoscopy;;   BIOPSY  08/02/2019   Procedure: BIOPSY;  Surgeon: Yetta Flock, MD;  Location: [place];  Service: Gastroenterology;;   BIOPSY  02/08/2021   Procedure: BIOPSY;  Surgeon: Sharyn Creamer, MD;  Location: [place];  Service: Gastroenterology;;   COLONOSCOPY  01/23/2012   Procedure: COLONOSCOPY;  Surgeon: Danie Binder, MD;  [facility];  Service: Endoscopy;  Laterality: N/A;  11:10 AM   COLONOSCOPY WITH PROPOFOL N/A 06/30/2019   Procedure: COLONOSCOPY WITH PROPOFOL;  Surgeon: Ronald Lobo, MD;  Location: [place];  Service: Endoscopy;  Laterality: N/A;   ENTEROSCOPY N/A 08/02/2019   Procedure: ENTEROSCOPY;  Surgeon: Yetta Flock, MD;  Location: [place] [facility];  Service:  Gastroenterology;  Laterality: N/A;   ESOPHAGOGASTRODUODENOSCOPY N/A 08/10/2020   Procedure: ESOPHAGOGASTRODUODENOSCOPY (EGD);  Surgeon: Jerene Bears, MD;  Location: [facility] END[facility]COPY;  Service: Gastroenterology;  Laterality: N/A;   ESOPHAGOGASTRODUODENOSCOPY (EGD) WITH PROPOFOL N/A 06/30/2019   Procedure: ESOPHAGOGASTRODUODENOSCOPY (EGD) WITH PROPOFOL;  Surgeon: Ronald Lobo, MD;  Location: [place];  Service: Endoscopy;  Laterality: N/A;   ESOPHAGOGASTRODUODENOSCOPY (EGD) WITH PROPOFOL N/A 01/12/2021    Procedure: ESOPHAGOGASTRODUODENOSCOPY (EGD) WITH PROPOFOL;  Surgeon: Sharyn Creamer, MD;  Location: [place];  Service: Gastroenterology;  Laterality: N/A;   ESOPHAGOGASTRODUODENOSCOPY (EGD) WITH PROPOFOL N/A 02/08/2021   Procedure: ESOPHAGOGASTRODUODENOSCOPY (EGD) WITH PROPOFOL;  Surgeon: Sharyn Creamer, MD;  Location: [place];  Service: Gastroenterology;  Laterality: N/A;   FISTULA SUPERFICIALIZATION Left 10/17/2019   Procedure: LEFT UPPER EXTREMITY FISTULA REVISION, SIDE BRANCH LIGATION,  AND SUPERFICIALIZATION;  Surgeon: Marty Heck, MD;  Location: [place];  Service: Vascular;  Laterality: Left;   GIVENS CAPSULE STUDY N/A 06/30/2019   Procedure: GIVENS CAPSULE STUDY;  Surgeon: Ronald Lobo, MD;  Location: [place];  Service: Endoscopy;  Laterality: N/A;   HEMOSTASIS CLIP PLACEMENT  08/10/2020   Procedure: HEMOSTASIS CLIP PLACEMENT;  Surgeon: Jerene Bears, MD;  Location: [facility] ENDOSCOPY;  Service: Gastroenterology;;   HEMOSTASIS CLIP PLACEMENT  01/12/2021   Procedure: HEMOSTASIS CLIP PLACEMENT;  Surgeon: Sharyn Creamer, MD;  Location: [facility] ENDOSCOPY;  Service: Gastroenterology;;   HEMOSTASIS CONTROL  08/02/2019   Procedure: HEMOSTASIS CONTROL;  Surgeon: Yetta Flock, MD;  Location: [facility] ENDOSCOPY;  Service: Gastroenterology;;   INCISION AND DRAINAGE ABSCESS N/A 06/29/2016   Procedure: INCISION AND DRAINAGE ABDOMINAL WALL ABSCESS;  Surgeon: Alphonsa Overall, MD;  Location: [facility];  Service: General;  Laterality: N/A;   INSERTION OF DIALYSIS CATHETER Right 08/03/2019   Procedure: INSERTION OF DIALYSIS CATHETER;  Surgeon: Waynetta Sandy, MD;  Location: [place];  Service: Vascular;  Laterality: Right;   INSERTION OF DIALYSIS CATHETER Right 10/22/2019   Procedure: INSERTION OF 23CM TUNNELED DIALYSIS CATHETER RIGHT INTERNAL JUGULAR;  Surgeon: Angelia Mould, MD;  Location: [place];  Service: Vascular;  Laterality: Right;   INSERTION OF DIALYSIS CATHETER Right 08/12/2020    Procedure: INSERTION OF Right internal Jugular TUNNELED  DIALYSIS CATHETER.;  Surgeon: Waynetta Sandy, MD;  Location: [place];  Service: Vascular;  Laterality: Right;   Left heel surgery     PENILE BIOPSY N/A 03/26/2020   Procedure: PENILE ULCER DEBRIDEMENT;  Surgeon: Remi Haggard, MD;  Location: [facility];  Service: Urology;  Laterality: N/A;  Organ  01/12/2021   Procedure: SCLEROTHERAPY;  Surgeon: Sharyn Creamer, MD;  Location: [facility] ENDOSCOPY;  Service: Gastroenterology;;    Family History  Problem Relation Age of Onset   Colon cancer Neg Hx      Social History   Tobacco Use   Smoking status: Every Day    Packs/day: 1.00    Years: 30.00    Pack years: 30.00    Types: Cigarettes   Smokeless tobacco: Never  Vaping Use   Vaping Use: Never used  Substance Use Topics   Alcohol use: Not Currently    Alcohol/week: 12.0 standard drinks    Types: 12 Cans of beer per week   Drug use: Not Currently    Types: "Crack" cocaine    Comment: last in 2020    Prior to Admission medications   Medication Sig Start Date End Date Taking? Authorizing Provider  amLODipine (NORVASC) 10 MG tablet Take 10  mg by mouth in the morning and at bedtime. 01/01/21  Yes [provider]  blood glucose meter kit and supplies KIT Dispense based on patient and insurance preference. Use up to four times daily as directed. (FOR ICD-9 250.00, 250.01). 07/01/16  Yes Nita Sells, MD  Calcium Acetate 667 MG TABS Take 1,334 mg by mouth in the morning, at noon, and at bedtime. 09/11/20  Yes [provider]  cetirizine (ZYRTEC ALLERGY) 10 MG tablet Take 1 tablet (10 mg total) by mouth daily as needed for allergies. 08/22/20  Yes [name], NP  cloNIDine (CATAPRES) 0.2 MG tablet Take  1 tablet twice daily . Patient taking differently: Take 0.2 mg by mouth 2 (two) times daily. Take  1 tablet twice daily . 10/28/20  Yes Donnamae Jude, MD  doxepin (SINEQUAN) 10 MG capsule  Take 10 mg by mouth at bedtime. 01/03/21  Yes [provider]  gabapentin (NEURONTIN) 100 MG capsule Take 1 capsule (100 mg total) by mouth at bedtime. 08/16/20  Yes Florencia Reasons, MD  gabapentin (NEURONTIN) 300 MG capsule Take 300 mg by mouth in the morning, at noon, and at bedtime. 02/04/21  Yes [provider]  hydrOXYzine (ATARAX/VISTARIL) 25 MG tablet TAKE 1 TABLET BY MOUTH EVERY 8 HOURS AS NEEDED FOR ANXIETY OR  ITCHING Patient taking differently: Take 25 mg by mouth in the morning and at bedtime. 11/07/20  Yes [name], NP  lidocaine-prilocaine (EMLA) cream Apply 1 application topically once. 12/27/20  Yes [provider]  pantoprazole (PROTONIX) 40 MG tablet Take 1 tablet (40 mg total) by mouth 2 (two) times daily. 02/09/21  Yes Ghimire, Henreitta Leber, MD  sucralfate (CARAFATE) 1 g tablet Take 1 g by mouth daily. 01/30/21  Yes [provider]  colchicine 0.6 MG tablet Take 0.5 tablets (0.3 mg total) by mouth 2 (two) times daily. 07/24/20 07/29/20  Noemi Chapel, MD  ferrous sulfate 325 (65 FE) MG tablet SMARTSIG:1 Tablet(s) By Mouth Twice Daily 08/11/19 02/09/20  [provider]  furosemide (LASIX) 40 MG tablet Take 1 tablet (40 mg total) by mouth daily. 07/25/19 02/09/20  Ladona Horns, MD    Current Facility-Administered Medications  Medication Dose Route Frequency Provider Last Rate Last Admin   0.9 %  sodium chloride infusion  10 mL/hr Intravenous Once Valarie Merino, MD   Held at 02/23/21 1219   0.9 %  sodium chloride infusion  250 mL Intravenous PRN Orma Flaming, MD       [START ON 02/24/2021] amLODipine (NORVASC) tablet 10 mg  10 mg Oral Daily Orma Flaming, MD       calcium acetate (PHOSLO) capsule 1,334 mg  1,334 mg Oral TID with meals Orma Flaming, MD       [START ON 02/26/2021] Darbepoetin Alfa (ARANESP) injection 200 mcg  200 mcg Intravenous Q Tue-HD Ernest Haber, PA-C       doxepin (SINEQUAN) capsule 10 mg  10 mg Oral QHS Orma Flaming, MD       [START ON 02/24/2021] feeding supplement (NEPRO CARB STEADY) liquid 237 mL  237 mL Oral BID BM Zeyfang, David, PA-C       folic acid (FOLVITE) tablet 1 mg  1 mg Oral Daily Orma Flaming, MD   1 mg at 02/23/21 1349   gabapentin (NEURONTIN) capsule 100 mg  100 mg Oral QHS Orma Flaming, MD       gabapentin (NEURONTIN) capsule 300 mg  300 mg Oral TID Orma Flaming, MD  300 mg at 02/23/21 1742   hydrOXYzine (ATARAX/VISTARIL) tablet 25 mg  25 mg Oral BID Orma Flaming, MD       insulin aspart (novoLOG) injection 0-6 Units  0-6 Units Subcutaneous TID WC Orma Flaming, MD       lanthanum [facility]) chewable tablet 1,000 mg  1,000 mg Oral TID WC Ernest Haber, PA-C       [START ON 02/24/2021] loratadine (CLARITIN) tablet 10 mg  10 mg Oral Daily Orma Flaming, MD       multivitamin with minerals tablet 1 tablet  1 tablet Oral Daily Orma Flaming, MD   1 tablet at 02/23/21 1349   nicotine (NICODERM CQ - dosed in mg/24 hours) patch 21 mg  21 mg Transdermal Daily Orma Flaming, MD   21 mg at 02/23/21 1742   oxyCODONE (Oxy IR/ROXICODONE) immediate release tablet 5 mg  5 mg Oral Q4H PRN Orma Flaming, MD       [START ON 02/24/2021] pantoprazole (PROTONIX) injection 40 mg  40 mg Intravenous Q12H Orma Flaming, MD       sodium chloride flush (NS) 0.9 % injection 3 mL  3 mL Intravenous Q12H Orma Flaming, MD   3 mL at 02/23/21 1348   sodium chloride flush (NS) 0.9 % injection 3 mL  3 mL Intravenous PRN Orma Flaming, MD       Derrill Memo ON 02/26/2021] sodium thiosulfate 25 g in sodium chloride 0.9 % 200 mL Infusion for Calciphylaxis  25 g Intravenous Q T,Th,Sa-HD Ernest Haber, PA-C       sucralfate (CARAFATE) tablet 1 g  1 g Oral Daily Orma Flaming, MD       thiamine tablet 100 mg  100 mg Oral Daily Orma Flaming, MD   100 mg at 02/23/21 1349   Current Outpatient Medications  Medication Sig Dispense Refill   amLODipine (NORVASC) 10 MG tablet Take 10 mg by mouth in the morning and  at bedtime.     blood glucose meter kit and supplies KIT Dispense based on patient and insurance preference. Use up to four times daily as directed. (FOR ICD-9 250.00, 250.01). 1 each 0   Calcium Acetate 667 MG TABS Take 1,334 mg by mouth in the morning, at noon, and at bedtime.     cetirizine (ZYRTEC ALLERGY) 10 MG tablet Take 1 tablet (10 mg total) by mouth daily as needed for allergies. 90 tablet 1   cloNIDine (CATAPRES) 0.2 MG tablet Take  1 tablet twice daily . (Patient taking differently: Take 0.2 mg by mouth 2 (two) times daily. Take  1 tablet twice daily .) 180 tablet 1   doxepin (SINEQUAN) 10 MG capsule Take 10 mg by mouth at bedtime.     gabapentin (NEURONTIN) 100 MG capsule Take 1 capsule (100 mg total) by mouth at bedtime. 30 capsule 0   gabapentin (NEURONTIN) 300 MG capsule Take 300 mg by mouth in the morning, at noon, and at bedtime.     hydrOXYzine (ATARAX/VISTARIL) 25 MG tablet TAKE 1 TABLET BY MOUTH EVERY 8 HOURS AS NEEDED FOR ANXIETY OR  ITCHING (Patient taking differently: Take 25 mg by mouth in the morning and at bedtime.) 90 tablet 0   lidocaine-prilocaine (EMLA) cream Apply 1 application topically once.     pantoprazole (PROTONIX) 40 MG tablet Take 1 tablet (40 mg total) by mouth 2 (two) times daily. 60 tablet 2   sucralfate (CARAFATE) 1 g tablet Take 1 g by mouth daily.      Allergies as  of 02/23/2021 - Review Complete 02/23/2021  Allergen Reaction Noted   Dilaudid [hydromorphone hcl] Itching and Other (See Comments) 07/29/2020     Review of Systems:    As per HPI, otherwise negative    Physical Exam:  Vital signs in last 24 hours: Temp:  [98.2 F (36.8 C)-98.3 F (36.8 C)] 98.2 F (36.8 C) (10/29 1613) Pulse Rate:  [87-101] 87 (10/29 1745) Resp:  [14-25] 21 (10/29 1745) BP: (84-190)/(45-109) 128/86 (10/29 1745) SpO2:  [94 %-100 %] 97 % (10/29 1745) Weight:  [104.3 kg] 104.3 kg (10/29 0936)   General:   Pleasant African-American male in NAD, lying in  bed Head:  Normocephalic and atraumatic. Eyes:   No icterus.   Conjunctiva pink. Ears:  Normal auditory acuity. Neck:  Supple Lungs:  Respirations even and unlabored. Lungs clear to auscultation bilaterally.   No wheezes, crackles, or rhonchi.  Heart:  Regular rate and rhythm; soft systolic murmur at left upper sternal border Abdomen:  Soft, obese, nondistended, nontender. Normal bowel sounds. No appreciable masses or hepatomegaly.  Rectal:  Not performed.  Msk:  Symmetrical without gross deformities.  Extremities:  Without edema. Neurologic:  Alert and  oriented x4;  grossly normal neurologically. Skin: Numerous hyperpigmented lesions on lower extremities and chest.  Some lesions in legs with crusting Psych:  Alert and cooperative. Normal affect.  LAB RESULTS: Recent Labs    02/23/21 1002 02/23/21 1520  WBC 5.4 4.8  HGB 5.0* 4.8*  HCT 15.7* 14.9*  PLT 137* 129*   BMET Recent Labs    02/23/21 0948  NA 140  K 3.7  CL 96*  CO2 29  GLUCOSE 98  BUN 44*  CREATININE 9.33*  CALCIUM 8.2*   LFT Recent Labs    02/23/21 0948  PROT 7.5  ALBUMIN 2.6*  AST 79*  ALT 24  ALKPHOS 69  BILITOT 0.9   PT/INR No results for input(s): LABPROT, INR in the last 72 hours.  STUDIES: DG Chest Port 1 View  Result Date: 02/23/2021 CLINICAL DATA:  Syncope EXAM: PORTABLE CHEST 1 VIEW COMPARISON:  Previous studies including the examination of 02/07/2021 FINDINGS: Transverse diameter of heart is increased. There are no signs of pulmonary edema. There is no focal pulmonary consolidation. There is no significant pleural effusion or pneumothorax. IMPRESSION: Cardiomegaly. There are no signs of pulmonary edema or focal pulmonary consolidation. Electronically Signed   By: Elmer Picker M.D.   On: 02/23/2021 11:08     PREVIOUS ENDOSCOPIES:            March 2021 Colonoscopy and EGD for anemia and FOBT Colonoscopy --complete exam, good prep --No active bleeding or source of heme positive  stool/anemia seen. - Diverticulosis in the sigmoid colon. - Probable old blood in the terminal ileum, raising the question of a small bowel source (given negative egd). - The distal rectum and anal verge are normal on retroflexion view. - No specimens collected. EGD - Normal larynx. - Normal esophagus. - Normal stomach. - Duodenal polyp vs. prominent papilla, benign-appearing. Biopsied. FINAL MICROSCOPIC DIAGNOSIS:   A. DUODENUM, BIOPSY:  - Patchy chronic duodenitis with surface gastric foveolar metaplasia,  suggestive of peptic duodenitis   B. DUODENUM, BIOPSY:  - Patchy chronic duodenitis with surface gastric foveolar metaplasia,  suggestive of peptic duodenitis    March 2021 Small bowel video capsule study --Normal study . No blood seen in 13 hours    08/02/19 Small bowel enteroscopy for intermittent dark stools.  --Esophagogastric landmarks identified. - Normal  esophagus. - Normal stomach. - Prominent ampulla without overt adenomatous changes. Biopsied. - A few benign appearing duodenal polyps. Biopsied, one suspected AVM treated as above. 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 Treated with argon plasma coagulation (APC). - A single non-bleeding angiodysplastic lesion in the proximal jejunum. Treated with argon plasma coagulation (APC).   FINAL MICROSCOPIC DIAGNOSIS:   A. AMPULLA, BIOPSY:  - Pyloric gland adenoma  - Negative for cytologic dysplasia in submitted biopsies   B. DUODENUM BULB, BIOPSY:  - Duodenal mucosa with mild reactive changes  - Negative for dysplasia, increased intraepithelial lymphocytes or  villous architectural changes      08/10/20 EGD for melena  --normal esophagus --Gastriits --Bleeding duodenal ulcer with a visible vessel. Clips were placed x 3 with apparent hemostasis. - Duodenitis with nodularity and polypoid mucosa. - No specimens collected   EGD Sept 17, 2022 -Oozing prepyloric gastric ulcer, treated with  epinephrine and Hemoclip x2 - H. pylori positive gastritis  EGD February 08, 2021 -Small clean-based antral ulcer -Previously placed hemoclips prolapsed into duodenal bulb - No evidence of bleeding and no stigmata -repeat biopsies negative for H. 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  He is hemodynamically stable and is receiving 2 units PRBCs currently.  This presentation is highly consistent with recurrent upper GI bleed, but given his recent upper endoscopy with no high risk ulcers, I am more suspicious for AVMs as the source of this bleed.  Given that the AVMs were located in the duodenum and jejunum last year, I will plan to repeat a push enteroscopy reassess for recurrent small bowel AVMs.  He was also noted to have a prominent ampulla with biopsy showing a pyloric gland adenoma in 2021.  It does not appear that this has been resected (patient did not follow up as outpatient).  We will reassess for this tomorrow as well.  Upper GI bleed (melena, acute blood loss anemia) - Enteroscopy tomorrow morning - Protonix 40 mg IV twice daily - Clear liquid diet tonight, n.p.o. at 3 AM - Transfuse to maintain hemoglobin greater than 7 - CBC posttransfusion and in a.m. - Reassess ampullary pyloric gland adenoma     LOS: 0 days   [name]  02/23/2021, 5:47 PM

## 2021-02-23 NOTE — ED Notes (Signed)
Pt c/o some generalized itching no rash   light discoomfort

## 2021-02-23 NOTE — ED Provider Notes (Signed)
[facility] EMERGENCY DEPARTMENT Provider Note   CSN: [id number] Arrival date & time: 02/23/21  4536     History Chief Complaint  Patient presents with   Weakness    Daniel [name] is a 59 y.o. male.  60 year old male with prior medical history as detailed below presents for evaluation.  Patient was at dialysis this morning.  He completed about 1.5 hours of dialysis.  Patient reportedly had a passing out episode while on dialysis.  Patient complains of feeling weak and fatigued.  He complains of bloody stools.  He denies bloody emesis.  He denies fever or chest pain.  He appears to be comfortable on initial exam.  The history is provided by the patient.  Weakness Severity:  Moderate Onset quality:  Gradual Timing:  Unable to specify Progression:  Waxing and waning Chronicity:  New     Past Medical History:  Diagnosis Date   Anemia    Diabetes mellitus without complication ([place])    patient denies   Dialysis patient ([place])    End stage chronic kidney disease ([place])    Hypertension    ICH (intracerebral hemorrhage) ([place]) 05/20/2017   Shoulder pain, left 06/28/2013    Patient Active Problem List   Diagnosis Date Noted   UGI bleed 02/07/2021   Rotator cuff tear arthropathy of right shoulder 01/23/2021   Alcohol dependence ([place]) 01/13/2021   Gastric ulcer with hemorrhage 01/13/2021   Generalized weakness    Transaminitis 01/10/2021   Pain in joint of right shoulder 11/14/2020   Right hip pain 11/14/2020   Seizure-like activity ([place]) 10/27/2020   History of alcohol abuse 10/27/2020   History of cocaine abuse ([place]) 10/27/2020   Nicotine dependence, cigarettes, uncomplicated 46/80/3212   Fever 10/27/2020   Acute GI bleeding 08/10/2020   Gastrointestinal hemorrhage with melena    Duodenal ulcer with hemorrhage    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   ESRD on hemodialysis ([place])    AVM (arteriovenous malformation) of small bowel,  acquired with hemorrhage    Symptomatic anemia 07/31/2019   Acute on chronic anemia 07/23/2019   Chronic kidney disease, stage V ([place])    Chronic hepatitis C without hepatic coma ([place]) 07/11/2019   Iron deficiency anemia 03/30/2019   CKD (chronic kidney disease), stage V ([place]) 03/30/2019   Alcohol use disorder 03/30/2019   B12 deficiency 03/30/2019   Orthostatic hypotension 01/22/2019   Anemia    Essential hypertension 06/29/2016   DM2 (diabetes mellitus, type 2) ([place]) 06/29/2016    Past Surgical History:  Procedure Laterality Date   A/V FISTULAGRAM N/A 08/15/2020   Procedure: A/V FISTULAGRAM - Left Upper;  Surgeon: Cherre Robins, MD;  Location: [place] [facility];  Service: Cardiovascular;  Laterality: N/A;   APPENDECTOMY     AV FISTULA PLACEMENT Left 08/03/2019   Procedure: LEFT ARM ARTERIOVENOUS (AV) CEPHALIC  FISTULA CREATION;  Surgeon: Waynetta Sandy, MD;  Location: [place];  Service: Vascular;  Laterality: Left;   BIOPSY  06/30/2019   Procedure: BIOPSY;  Surgeon: Ronald Lobo, MD;  Location: [place];  Service: Endoscopy;;   BIOPSY  08/02/2019   Procedure: BIOPSY;  Surgeon: Yetta Flock, MD;  Location: [place];  Service: Gastroenterology;;   BIOPSY  02/08/2021   Procedure: BIOPSY;  Surgeon: Sharyn Creamer, MD;  Location: [place];  Service: Gastroenterology;;   COLONOSCOPY  01/23/2012   Procedure: COLONOSCOPY;  Surgeon: Danie Binder, MD;  [facility];  Service:  Endoscopy;  Laterality: N/A;  11:10 AM   COLONOSCOPY WITH PROPOFOL N/A 06/30/2019   Procedure: COLONOSCOPY WITH PROPOFOL;  Surgeon: Ronald Lobo, MD;  Location: [place];  Service: Endoscopy;  Laterality: N/A;   ENTEROSCOPY N/A 08/02/2019   Procedure: ENTEROSCOPY;  Surgeon: Yetta Flock, MD;  Location: [facility] ENDOSCOPY;  Service: Gastroenterology;  Laterality: N/A;   ESOPHAGOGASTRODUODENOSCOPY N/A 08/10/2020   Procedure: ESOPHAGOGASTRODUODENOSCOPY (EGD);  Surgeon: Jerene Bears, MD;  Location: [facility] ENDOSCOPY;  Service: Gastroenterology;  Laterality: N/A;   ESOPHAGOGASTRODUODENOSCOPY (EGD) WITH PROPOFOL N/A 06/30/2019   Procedure: ESOPHAGOGASTRODUODENOSCOPY (EGD) WITH PROPOFOL;  Surgeon: Ronald Lobo, MD;  Location: [place];  Service: Endoscopy;  Laterality: N/A;   ESOPHAGOGASTRODUODENOSCOPY (EGD) WITH PROPOFOL N/A 01/12/2021   Procedure: ESOPHAGOGASTRODUODENOSCOPY (EGD) WITH PROPOFOL;  Surgeon: Sharyn Creamer, MD;  Location: [place];  Service: Gastroenterology;  Laterality: N/A;   ESOPHAGOGASTRODUODENOSCOPY (EGD) WITH PROPOFOL N/A 02/08/2021   Procedure: ESOPHAGOGASTRODUODENOSCOPY (EGD) WITH PROPOFOL;  Surgeon: Sharyn Creamer, MD;  Location: [place];  Service: Gastroenterology;  Laterality: N/A;   FISTULA SUPERFICIALIZATION Left 10/17/2019   Procedure: LEFT UPPER EXTREMITY FISTULA REVISION, SIDE BRANCH LIGATION,  AND SUPERFICIALIZATION;  Surgeon: Marty Heck, MD;  Location: [place];  Service: Vascular;  Laterality: Left;   GIVENS CAPSULE STUDY N/A 06/30/2019   Procedure: GIVENS CAPSULE STUDY;  Surgeon: Ronald Lobo, MD;  Location: [place];  Service: Endoscopy;  Laterality: N/A;   HEMOSTASIS CLIP PLACEMENT  08/10/2020   Procedure: HEMOSTASIS CLIP PLACEMENT;  Surgeon: Jerene Bears, MD;  Location: [facility] ENDOSCOPY;  Service: Gastroenterology;;   HEMOSTASIS CLIP PLACEMENT  01/12/2021   Procedure: HEMOSTASIS CLIP PLACEMENT;  Surgeon: Sharyn Creamer, MD;  Location: [facility] ENDOSCOPY;  Service: Gastroenterology;;   HEMOSTASIS CONTROL  08/02/2019   Procedure: HEMOSTASIS CONTROL;  Surgeon: Yetta Flock, MD;  Location: [facility] ENDOSCOPY;  Service: Gastroenterology;;   INCISION AND DRAINAGE ABSCESS N/A 06/29/2016   Procedure: INCISION AND DRAINAGE ABDOMINAL WALL ABSCESS;  Surgeon: Alphonsa Overall, MD;  Location: [facility];  Service: General;  Laterality: N/A;   INSERTION OF DIALYSIS CATHETER Right 08/03/2019   Procedure: INSERTION OF DIALYSIS CATHETER;  Surgeon: Waynetta Sandy, MD;  Location: [place];  Service: Vascular;  Laterality: Right;   INSERTION OF DIALYSIS CATHETER Right 10/22/2019   Procedure: INSERTION OF 23CM TUNNELED DIALYSIS CATHETER RIGHT INTERNAL JUGULAR;  Surgeon: Angelia Mould, MD;  Location: [place];  Service: Vascular;  Laterality: Right;   INSERTION OF DIALYSIS CATHETER Right 08/12/2020   Procedure: INSERTION OF Right internal Jugular TUNNELED  DIALYSIS CATHETER.;  Surgeon: Waynetta Sandy, MD;  Location: [place];  Service: Vascular;  Laterality: Right;   Left heel surgery     PENILE BIOPSY N/A 03/26/2020   Procedure: PENILE ULCER DEBRIDEMENT;  Surgeon: Remi Haggard, MD;  Location: [facility];  Service: Urology;  Laterality: N/A;  Start  01/12/2021   Procedure: SCLEROTHERAPY;  Surgeon: Sharyn Creamer, MD;  Location: [facility] ENDOSCOPY;  Service: Gastroenterology;;       Family History  Problem Relation Age of Onset   Colon cancer Neg Hx     Social History   Tobacco Use   Smoking status: Every Day    Packs/day: 1.00    Years: 30.00    Pack years: 30.00    Types: Cigarettes   Smokeless tobacco: Never  Vaping Use   Vaping Use: Never used  Substance Use Topics   Alcohol use: Not Currently    Alcohol/week:  12.0 standard drinks    Types: 12 Cans of beer per week   Drug use: Not Currently    Types: "Crack" cocaine    Comment: last in 2020    Home Medications Prior to Admission medications   Medication Sig Start Date End Date Taking? Authorizing Provider  amLODipine (NORVASC) 10 MG tablet Take 10 mg by mouth in the morning and at bedtime. 01/01/21   [provider]  blood glucose meter kit and supplies KIT Dispense based on patient and insurance preference. Use up to four times daily as directed. (FOR ICD-9 250.00, 250.01). 07/01/16   Nita Sells, MD  Calcium Acetate 667 MG TABS Take 1,334 mg by mouth 4 (four) times daily. 09/11/20   [provider]  cetirizine (ZYRTEC  ALLERGY) 10 MG tablet Take 1 tablet (10 mg total) by mouth daily as needed for allergies. 08/22/20   [name], NP  cloNIDine (CATAPRES) 0.2 MG tablet Take  1 tablet twice daily . Patient taking differently: Take 0.2 mg by mouth 2 (two) times daily. Take  1 tablet twice daily . 10/28/20   Donnamae Jude, MD  doxepin (SINEQUAN) 10 MG capsule Take 10 mg by mouth at bedtime. 01/03/21   [provider]  gabapentin (NEURONTIN) 100 MG capsule Take 1 capsule (100 mg total) by mouth at bedtime. 08/16/20   Florencia Reasons, MD  gabapentin (NEURONTIN) 300 MG capsule Take 300 mg by mouth in the morning, at noon, and at bedtime. 02/04/21   [provider]  hydrOXYzine (ATARAX/VISTARIL) 25 MG tablet TAKE 1 TABLET BY MOUTH EVERY 8 HOURS AS NEEDED FOR ANXIETY OR  ITCHING Patient taking differently: Take 25 mg by mouth in the morning and at bedtime. 11/07/20   [name], NP  lidocaine-prilocaine (EMLA) cream APPLY SMALL AMOUNT TO ACCESS SITE (AVF) 1 TO 2 HOURS BEFORE DIALYSIS. COVER WITH OCCLUSIVE DRESSING (SARAN WRAP) 12/27/20   [provider]  pantoprazole (PROTONIX) 40 MG tablet Take 1 tablet (40 mg total) by mouth 2 (two) times daily. 02/09/21   Ghimire, Henreitta Leber, MD  sucralfate (CARAFATE) 1 g tablet Take 1 g by mouth daily. 01/30/21   [provider]  colchicine 0.6 MG tablet Take 0.5 tablets (0.3 mg total) by mouth 2 (two) times daily. 07/24/20 07/29/20  Noemi Chapel, MD  ferrous sulfate 325 (65 FE) MG tablet SMARTSIG:1 Tablet(s) By Mouth Twice Daily 08/11/19 02/09/20  [provider]  furosemide (LASIX) 40 MG tablet Take 1 tablet (40 mg total) by mouth daily. 07/25/19 02/09/20  Ladona Horns, MD    Allergies    Dilaudid [hydromorphone hcl]  Review of Systems   Review of Systems  Neurological:  Positive for weakness.  All other systems reviewed and are negative.  Physical Exam Updated Vital Signs BP 122/75   Pulse 96   Temp 98.3 F (36.8 C) (Oral)    Resp (!) 21   Ht 5' 11"  (1.803 m)   Wt 104.3 kg   SpO2 94%   BMI 32.07 kg/m   Physical Exam Vitals and nursing note reviewed.  Constitutional:      General: He is not in acute distress.    Appearance: Normal appearance. He is well-developed.  HENT:     Head: Normocephalic and atraumatic.  Eyes:     Conjunctiva/sclera: Conjunctivae normal.     Pupils: Pupils are equal, round, and reactive to light.  Cardiovascular:     Rate and Rhythm: Normal rate and regular rhythm.  Heart sounds: Normal heart sounds.  Pulmonary:     Effort: Pulmonary effort is normal. No respiratory distress.     Breath sounds: Normal breath sounds.  Abdominal:     General: There is no distension.     Palpations: Abdomen is soft.     Tenderness: There is no abdominal tenderness.  Genitourinary:    Comments: Brown stool that is guaiac positive present on rectal exam Musculoskeletal:        General: No deformity. Normal range of motion.     Cervical back: Normal range of motion and neck supple.     Comments: AV fistula in left upper extremity with palpable thrill and no overlying erythema.  Skin:    General: Skin is warm and dry.  Neurological:     General: No focal deficit present.     Mental Status: He is alert and oriented to person, place, and time.    ED Results / Procedures / Treatments   Labs (all labs ordered are listed, but only abnormal results are displayed) Labs Reviewed  COMPREHENSIVE METABOLIC PANEL - Abnormal; Notable for the following components:      Result Value   Chloride 96 (*)    BUN 44 (*)    Creatinine, Ser 9.33 (*)    Calcium 8.2 (*)    Albumin 2.6 (*)    AST 79 (*)    GFR, Estimated 6 (*)    All other components within normal limits  LIPASE, BLOOD - Abnormal; Notable for the following components:   Lipase 108 (*)    All other components within normal limits  CBC WITH DIFFERENTIAL/PLATELET - Abnormal; Notable for the following components:   RBC 1.39 (*)     Hemoglobin 5.0 (*)    HCT 15.7 (*)    MCV 112.9 (*)    MCH 36.0 (*)    RDW 17.0 (*)    Platelets 137 (*)    nRBC 1.5 (*)    All other components within normal limits  POC OCCULT BLOOD, ED - Abnormal; Notable for the following components:   Fecal Occult Bld POSITIVE (*)    All other components within normal limits  CULTURE, BLOOD (ROUTINE X 2)  CULTURE, BLOOD (ROUTINE X 2)  RESP PANEL BY RT-PCR (FLU A&B, COVID) ARPGX2  LACTIC ACID, PLASMA  CBC WITH DIFFERENTIAL/PLATELET  LACTIC ACID, PLASMA  PROTIME-INR  CBG MONITORING, ED  TYPE AND SCREEN  PREPARE RBC (CROSSMATCH)  TROPONIN I (HIGH SENSITIVITY)  TROPONIN I (HIGH SENSITIVITY)    EKG EKG Interpretation  Date/Time:  Saturday February 23 2021 09:35:26 EDT Ventricular Rate:  98 PR Interval:  171 QRS Duration: 84 QT Interval:  415 QTC Calculation: 530 R Axis:   -18 Text Interpretation: Sinus rhythm Borderline left axis deviation Anterior infarct, old Prolonged QT interval Confirmed by Dene Gentry [phone]) on 02/23/2021 9:49:11 AM  Radiology DG Chest Port 1 View  Result Date: 02/23/2021 CLINICAL DATA:  Syncope EXAM: PORTABLE CHEST 1 VIEW COMPARISON:  Previous studies including the examination of 02/07/2021 FINDINGS: Transverse diameter of heart is increased. There are no signs of pulmonary edema. There is no focal pulmonary consolidation. There is no significant pleural effusion or pneumothorax. IMPRESSION: Cardiomegaly. There are no signs of pulmonary edema or focal pulmonary consolidation. Electronically Signed   By: Elmer Picker M.D.   On: 02/23/2021 11:08    Procedures Procedures   Medications Ordered in ED Medications  pantoprazole (PROTONIX) 80 mg /NS 100 mL IVPB (has no administration in time  range)  0.9 %  sodium chloride infusion (has no administration in time range)    ED Course  I have reviewed the triage vital signs and the nursing notes.  Pertinent labs & imaging results that were available during  my care of the patient were reviewed by me and considered in my medical decision making (see chart for details).    MDM Rules/Calculators/A&P                           MDM  MSE complete  Daniel [name] was evaluated in Emergency Department on 02/23/2021 for the symptoms described in the history of present illness. He was evaluated in the context of the global COVID-19 pandemic, which necessitated consideration that the patient might be at risk for infection with the SARS-CoV-2 virus that causes COVID-19. Institutional protocols and algorithms that pertain to the evaluation of patients at risk for COVID-19 are in a state of rapid change based on information released by regulatory bodies including the CDC and federal and state organizations. These policies and algorithms were followed during the patient's care in the ED.  Presents from dialysis after reported syncopal event during dialysis.  Patient is comfortable on arrival to the ED.  Patient does complain of recurrent rectal bleeding and bloody stools  Patient is guaiac is positive.  Patient with hemoglobin of 5.  Patient's hemodynamics are stable.  The patient would benefit from admission and transfusion.  Hospitalist service is aware of case and will evaluate for admission.  Final Clinical Impression(s) / ED Diagnoses Final diagnoses:  Gastrointestinal hemorrhage, unspecified gastrointestinal hemorrhage type    Rx / DC Orders ED Discharge Orders     None        Valarie Merino, MD 02/23/21 1243

## 2021-02-23 NOTE — H&P (Signed)
History and Physical    LETROY VAZGUEZ YIR:[id number] DOB: 1961-03-26 DOA: 02/23/2021  PCP: [name], NP Consultants:  nephrology: Narda Amber kidney Patient coming from: dialysis. Lives alone   Chief Complaint: weakness and not feeling well   HPI: ASKARI KINLEY is a 60 y.o. male with medical history significant of  ESRD on HD TTS, alcohol and cocaine use, known history of peptic ulcer-most recent EGD on 10/14 requiring hemostasis, T2DM, anemia, hepatitis C who presented to ED after having episode of weakness and feeling like his speech was off during dialysis today.  The nurses report he passed out, but he states this did not happen and he never lost consciousness.  He has just been feeling very weak and tired. He states he has had dark/black stools for the past few days as well, no bright red blood. He also has stomach pain in lower left quadrant for over a month. He denies any alcohol or NSAID use. No recent steroids.   He denies any fever/chills, chest pain or palpitations, headaches or vision changes. He does have some shortness of breath, no cough. His left leg has a sore that has been there x 2-3 months. No extra swelling in his legs from baseline.    Chronic abdominal pain in LLQ that has been there for over a month. Constant, 8/10 and sharp. No radiation. CT scan on 10/13 with no acute finding in LLQ.   ED Course: vitals: Afebrile, blood pressure 134/81, heart rate 100, respiratory rate 18, oxygen 97% on room air. Pertinent labs: BUN 44, creatinine 9.33, AST 79, lipase 108, globin 5.0, hematocrit 15.7, MCV 112, platelets 137, fecal occult positive CXR x-ray: Shows cardiomegaly no signs of pulmonary edema or acute consolidation.  In ED patient was started on Protonix drip and 2 units of PRBCs ordered to transfuse.  GI was consulted and we were asked to admit.   Review of Systems: As per HPI; otherwise review of systems reviewed and negative.   Ambulatory Status:   Ambulates without assistance, but states he needs a walker     Past Medical History:  Diagnosis Date   Anemia    Diabetes mellitus without complication [facility])    patient denies   Dialysis patient [facility])    End stage chronic kidney disease ([place])    Hypertension    ICH (intracerebral hemorrhage) ([place]) 05/20/2017   Shoulder pain, left 06/28/2013    Past Surgical History:  Procedure Laterality Date   A/V FISTULAGRAM N/A 08/15/2020   Procedure: A/V FISTULAGRAM - Left Upper;  Surgeon: Cherre Robins, MD;  Location: [place] [facility] LAB;  Service: Cardiovascular;  Laterality: N/A;   APPENDECTOMY     AV FISTULA PLACEMENT Left 08/03/2019   Procedure: LEFT ARM ARTERIOVENOUS (AV) CEPHALIC  FISTULA CREATION;  Surgeon: Waynetta Sandy, MD;  Location: [place];  Service: Vascular;  Laterality: Left;   BIOPSY  06/30/2019   Procedure: BIOPSY;  Surgeon: Ronald Lobo, MD;  Location: [place];  Service: Endoscopy;;   BIOPSY  08/02/2019   Procedure: BIOPSY;  Surgeon: Yetta Flock, MD;  Location: [place];  Service: Gastroenterology;;   BIOPSY  02/08/2021   Procedure: BIOPSY;  Surgeon: Sharyn Creamer, MD;  Location: [place];  Service: Gastroenterology;;   COLONOSCOPY  01/23/2012   Procedure: COLONOSCOPY;  Surgeon: Danie Binder, MD;  [facility];  Service: Endoscopy;  Laterality: N/A;  11:10 AM   COLONOSCOPY WITH PROPOFOL N/A 06/30/2019   Procedure: COLONOSCOPY WITH  PROPOFOL;  Surgeon: Ronald Lobo, MD;  Location: [place];  Service: Endoscopy;  Laterality: N/A;   ENTEROSCOPY N/A 08/02/2019   Procedure: ENTEROSCOPY;  Surgeon: Yetta Flock, MD;  Location: [facility] ENDOSCOPY;  Service: Gastroenterology;  Laterality: N/A;   ESOPHAGOGASTRODUODENOSCOPY N/A 08/10/2020   Procedure: ESOPHAGOGASTRODUODENOSCOPY (EGD);  Surgeon: Jerene Bears, MD;  Location: [facility] ENDOSCOPY;  Service: Gastroenterology;  Laterality: N/A;   ESOPHAGOGASTRODUODENOSCOPY (EGD) WITH PROPOFOL N/A  06/30/2019   Procedure: ESOPHAGOGASTRODUODENOSCOPY (EGD) WITH PROPOFOL;  Surgeon: Ronald Lobo, MD;  Location: [place];  Service: Endoscopy;  Laterality: N/A;   ESOPHAGOGASTRODUODENOSCOPY (EGD) WITH PROPOFOL N/A 01/12/2021   Procedure: ESOPHAGOGASTRODUODENOSCOPY (EGD) WITH PROPOFOL;  Surgeon: Sharyn Creamer, MD;  Location: [place];  Service: Gastroenterology;  Laterality: N/A;   ESOPHAGOGASTRODUODENOSCOPY (EGD) WITH PROPOFOL N/A 02/08/2021   Procedure: ESOPHAGOGASTRODUODENOSCOPY (EGD) WITH PROPOFOL;  Surgeon: Sharyn Creamer, MD;  Location: [place];  Service: Gastroenterology;  Laterality: N/A;   FISTULA SUPERFICIALIZATION Left 10/17/2019   Procedure: LEFT UPPER EXTREMITY FISTULA REVISION, SIDE BRANCH LIGATION,  AND SUPERFICIALIZATION;  Surgeon: Marty Heck, MD;  Location: [place];  Service: Vascular;  Laterality: Left;   GIVENS CAPSULE STUDY N/A 06/30/2019   Procedure: GIVENS CAPSULE STUDY;  Surgeon: Ronald Lobo, MD;  Location: [place];  Service: Endoscopy;  Laterality: N/A;   HEMOSTASIS CLIP PLACEMENT  08/10/2020   Procedure: HEMOSTASIS CLIP PLACEMENT;  Surgeon: Jerene Bears, MD;  Location: [facility] ENDOSCOPY;  Service: Gastroenterology;;   HEMOSTASIS CLIP PLACEMENT  01/12/2021   Procedure: HEMOSTASIS CLIP PLACEMENT;  Surgeon: Sharyn Creamer, MD;  Location: [facility] ENDOSCOPY;  Service: Gastroenterology;;   HEMOSTASIS CONTROL  08/02/2019   Procedure: HEMOSTASIS CONTROL;  Surgeon: Yetta Flock, MD;  Location: [facility] ENDOSCOPY;  Service: Gastroenterology;;   INCISION AND DRAINAGE ABSCESS N/A 06/29/2016   Procedure: INCISION AND DRAINAGE ABDOMINAL WALL ABSCESS;  Surgeon: Alphonsa Overall, MD;  Location: [facility];  Service: General;  Laterality: N/A;   INSERTION OF DIALYSIS CATHETER Right 08/03/2019   Procedure: INSERTION OF DIALYSIS CATHETER;  Surgeon: Waynetta Sandy, MD;  Location: [place];  Service: Vascular;  Laterality: Right;   INSERTION OF DIALYSIS CATHETER Right 10/22/2019    Procedure: INSERTION OF 23CM TUNNELED DIALYSIS CATHETER RIGHT INTERNAL JUGULAR;  Surgeon: Angelia Mould, MD;  Location: [place];  Service: Vascular;  Laterality: Right;   INSERTION OF DIALYSIS CATHETER Right 08/12/2020   Procedure: INSERTION OF Right internal Jugular TUNNELED  DIALYSIS CATHETER.;  Surgeon: Waynetta Sandy, MD;  Location: [place];  Service: Vascular;  Laterality: Right;   Left heel surgery     PENILE BIOPSY N/A 03/26/2020   Procedure: PENILE ULCER DEBRIDEMENT;  Surgeon: Remi Haggard, MD;  Location: [facility];  Service: Urology;  Laterality: N/A;  [place]  01/12/2021   Procedure: SCLEROTHERAPY;  Surgeon: Sharyn Creamer, MD;  Location: [facility] END[facility]COPY;  Service: Gastroenterology;;    Social History   Socioeconomic History   Marital status: Single    Spouse name: Not on file   Number of children: Not on file   Years of education: Not on file   Highest education level: Not on file  Occupational History   Not on file  Tobacco Use   Smoking status: Every Day    Packs/day: 1.00    Years: 30.00    Pack years: 30.00    Types: Cigarettes   Smokeless tobacco: Never  Vaping Use   Vaping Use: Never used  Substance and Sexual Activity  Alcohol use: Not Currently    Alcohol/week: 12.0 standard drinks    Types: 12 Cans of beer per week   Drug use: Not Currently    Types: "Crack" cocaine    Comment: last in 2020   Sexual activity: Yes    Birth control/protection: None  Other Topics Concern   Not on file  Social History Narrative   Not on file   Social Determinants of Health   Financial Resource Strain: Not on file  Food Insecurity: Not on file  Transportation Needs: Not on file  Physical Activity: Not on file  Stress: Not on file  Social Connections: Not on file  Intimate Partner Violence: Not on file    Allergies  Allergen Reactions   Dilaudid [Hydromorphone Hcl] Itching and Other (See Comments)    Pt reports itchiness after IM  injection     Family History  Problem Relation Age of Onset   Colon cancer Neg Hx     Prior to Admission medications   Medication Sig Start Date End Date Taking? Authorizing Provider  amLODipine (NORVASC) 10 MG tablet Take 10 mg by mouth in the morning and at bedtime. 01/01/21   [provider]  blood glucose meter kit and supplies KIT Dispense based on patient and insurance preference. Use up to four times daily as directed. (FOR ICD-9 250.00, 250.01). 07/01/16   Nita Sells, MD  Calcium Acetate 667 MG TABS Take 1,334 mg by mouth 4 (four) times daily. 09/11/20   [provider]  cetirizine (ZYRTEC ALLERGY) 10 MG tablet Take 1 tablet (10 mg total) by mouth daily as needed for allergies. 08/22/20   [name], NP  cloNIDine (CATAPRES) 0.2 MG tablet Take  1 tablet twice daily . Patient taking differently: Take 0.2 mg by mouth 2 (two) times daily. Take  1 tablet twice daily . 10/28/20   Donnamae Jude, MD  doxepin (SINEQUAN) 10 MG capsule Take 10 mg by mouth at bedtime. 01/03/21   [provider]  gabapentin (NEURONTIN) 100 MG capsule Take 1 capsule (100 mg total) by mouth at bedtime. 08/16/20   Florencia Reasons, MD  gabapentin (NEURONTIN) 300 MG capsule Take 300 mg by mouth in the morning, at noon, and at bedtime. 02/04/21   [provider]  hydrOXYzine (ATARAX/VISTARIL) 25 MG tablet TAKE 1 TABLET BY MOUTH EVERY 8 HOURS AS NEEDED FOR ANXIETY OR  ITCHING Patient taking differently: Take 25 mg by mouth in the morning and at bedtime. 11/07/20   [name], NP  lidocaine-prilocaine (EMLA) cream APPLY SMALL AMOUNT TO ACCESS SITE (AVF) 1 TO 2 HOURS BEFORE DIALYSIS. COVER WITH OCCLUSIVE DRESSING (SARAN WRAP) 12/27/20   [provider]  pantoprazole (PROTONIX) 40 MG tablet Take 1 tablet (40 mg total) by mouth 2 (two) times daily. 02/09/21   Ghimire, Henreitta Leber, MD  sucralfate (CARAFATE) 1 g tablet Take 1 g by mouth daily. 01/30/21   [provider]  colchicine 0.6 MG tablet Take 0.5 tablets (0.3 mg total) by mouth 2 (two) times daily. 07/24/20 07/29/20  Noemi Chapel, MD  ferrous sulfate 325 (65 FE) MG tablet SMARTSIG:1 Tablet(s) By Mouth Twice Daily 08/11/19 02/09/20  [provider]  furosemide (LASIX) 40 MG tablet Take 1 tablet (40 mg total) by mouth daily. 07/25/19 02/09/20  Ladona Horns, MD    Physical Exam: Vitals:   02/23/21 0936 02/23/21 0945 02/23/21 1130 02/23/21 1145  BP:  (!) 147/77 122/75 140/81  Pulse:  99 96 94  Resp:  (!) 21 (!) 21 20  Temp:      TempSrc:      SpO2:  98% 94% 100%  Weight: 104.3 kg     Height: 5' 11"  (1.803 m)        General:  Appears calm and comfortable and is in NAD Eyes:  PERRL, EOMI, normal lids, iris ENT:  grossly normal hearing, lips & tongue, mmm; appropriate dentition Neck:  no LAD, masses or thyromegaly; no carotid bruits Cardiovascular:  RRR, no m/r/g. Trace bilateral leg edema.  Respiratory:   CTA bilaterally with no wheezes/rales/rhonchi.  Normal respiratory effort. Abdomen:  soft, TTP in LLQ and epigastric area.  No rebound or guarding, protuberant, NABS Back:   normal alignment, no CVAT Skin:  hyperpigmented macular rash over extremities and trunk. Skin lesion that is dried on posterior left calf.  Musculoskeletal:  grossly normal tone BUE/BLE, good ROM, no bony abnormality Lower extremity:  Limited foot exam with no ulcerations.  2+ distal pulses. Psychiatric:  grossly normal mood and affect, speech fluent and appropriate, AOx3 Neurologic:  CN 2-12 grossly intact, moves all extremities in coordinated fashion, sensation intact   Radiological Exams on Admission: Independently reviewed - see discussion in A/P where applicable  DG Chest Port 1 View  Result Date: 02/23/2021 CLINICAL DATA:  Syncope EXAM: PORTABLE CHEST 1 VIEW COMPARISON:  Previous studies including the examination of 02/07/2021 FINDINGS: Transverse diameter of heart is increased. There are no signs of  pulmonary edema. There is no focal pulmonary consolidation. There is no significant pleural effusion or pneumothorax. IMPRESSION: Cardiomegaly. There are no signs of pulmonary edema or focal pulmonary consolidation. Electronically Signed   By: Elmer Picker M.D.   On: 02/23/2021 11:08    EKG: Independently reviewed.  NSR with rate 98; nonspecific ST changes with no evidence of acute ischemia. Prolonged qt   Labs on Admission: I have personally reviewed the available labs and imaging studies at the time of the admission.  Pertinent labs:  BUN 44,  creatinine 9.33,  AST 79,  lipase 108,  hemoglobin 5.0,  hematocrit 15.7,  MCV 112,  platelets 137,  fecal occult positive    Assessment/Plan Principal Problem:   Gastrointestinal hemorrhage with melena with known PUD -60 year old male with known peptic ulcer disease presenting after questionable syncope/weakness at dialysis found to have a hemoglobin of 5.0, positive fecal occult and complaints of melena. 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  Mild inflammation with friability and granularity in the stomach.  As well as a 2 mm nonbleeding gastric ulcer in the gastric antrum. -GI has been consulted -clear liquid diet/SCDs  Active Problems:   Symptomatic anemia -Likely secondary to known peptic ulcer disease -Transfused 2 units packed red blood cells and will check serial H&H's, transfuse to keep hemoglobin greater than 7.    ESRD on hemodialysis ([facility] -He has dialysis on Tuesday/Thursdays and Saturdays and received half of his session today. -Nephrology has been consulted -no signs of volume overload -intake/output.   Prolonged qt -avoid QT prolonging drugs -check magnesium -telemetry -repeat ekg in AM    Transaminitis Chronic. Likely secondary to drinking and chronic HCV. Drinks a beer "every now and then" -ethanol level pending -acute hep panel checked in 12/2020 -[place] 10/22-cholelithiasis with associated nonspecific gallbladder wall thickening. Negative  murphy sign on exam.   Elevated lipase Elevated over past year. No TTP in epigastric area.      DM2 (diabetes mellitus, type 2) (HCC) A1c: 5.4 in 09/2020 Diet controlled Accuchecks and sensitive SSI per protocol     Essential hypertension -some soft bp reads. Start norvasc tomorrow, hold clonidine and add back if bp elevated.     History of alcohol abuse and cocaine abuse -Ethanol level and UDS pending -MV, thiamine, folic acid  -no hx of withdrawals.     Chronic hepatitis C without hepatic coma (HCC) -stable     Anemia due to chronic kidney disease -acute on chronic due to above -baseline hgb: 7.8-8.2    Nicotine dependence, cigarettes, uncomplicated Nicotine patch and encouraged to quit.  Skin rash Characteristic of perforating folliculitis   Ambulatory dysfunction Has fallen 5-6 times recently  PT/OT   Body mass index is 32.07 kg/m.    Level of care: Progressive DVT prophylaxis:   SCDs Code Status:  Full - confirmed with patient Family Communication: None present attempted to call brother.  Disposition Plan:  The patient is from: home  Anticipated d/c is to: per day team   Requires inpatient hospitalization for blood transfusion an GI bleed and is at significant risk of  worsening, requires constant monitoring, assessment and MDM with specialists.    Patient is currently: stable  Consults called: GI and nephrology   Admission status:  inpatient   Dragon dictation used in completing this note.    Orma Flaming MD [facility]   How to contact the [facility] Attending or Consulting provider [place] or covering provider during after hours [place], for this patient?  Check the care team in [facility] and look  for a) attending/consulting TR[facility] provider listed and b) the [facility] team listed Log into [email or web address] and use [place] universal password to access. If you do not have the password, please contact the hospital operator. Locate the [facility] provider you are looking for under [facility] and page to a number that you can be directly reached. If you still have difficulty reaching the provider, please page the [facility] (Director on Call) for the Hospitalists listed on amion for assistance.   02/23/2021, 12:45 PM

## 2021-02-23 NOTE — ED Notes (Signed)
Recheck  cbc per order of dr Boneta Lucks

## 2021-02-24 ENCOUNTER — Encounter (HOSPITAL_COMMUNITY): Payer: Self-pay | Admitting: Family Medicine

## 2021-02-24 ENCOUNTER — Encounter (HOSPITAL_COMMUNITY)
Admission: EM | Disposition: A | Discharge status: Home / Self Care | Payer: Self-pay | Source: Home / Self Care | Attending: Internal Medicine

## 2021-02-24 ENCOUNTER — Inpatient Hospital Stay (HOSPITAL_COMMUNITY): Payer: Medicare Other | Admitting: Certified Registered Nurse Anesthetist

## 2021-02-24 DIAGNOSIS — K317 Polyp of stomach and duodenum: Secondary | ICD-10-CM

## 2021-02-24 DIAGNOSIS — K921 Melena: Secondary | ICD-10-CM | POA: Diagnosis not present

## 2021-02-24 DIAGNOSIS — D62 Acute posthemorrhagic anemia: Secondary | ICD-10-CM

## 2021-02-24 DIAGNOSIS — K2970 Gastritis, unspecified, without bleeding: Secondary | ICD-10-CM

## 2021-02-24 DIAGNOSIS — D1330 Benign neoplasm of unspecified part of small intestine: Secondary | ICD-10-CM

## 2021-02-24 DIAGNOSIS — K31811 Angiodysplasia of stomach and duodenum with bleeding: Principal | ICD-10-CM

## 2021-02-24 HISTORY — PX: SCLEROTHERAPY: SHX6841

## 2021-02-24 HISTORY — PX: BIOPSY: SHX5522

## 2021-02-24 HISTORY — PX: ENTEROSCOPY: SHX5533

## 2021-02-24 HISTORY — PX: HOT HEMOSTASIS: SHX5433

## 2021-02-24 LAB — COMPREHENSIVE METABOLIC PANEL
ALT: 21 U/L (ref 0–44)
AST: 69 U/L — ABNORMAL HIGH (ref 15–41)
Albumin: 2.3 g/dL — ABNORMAL LOW (ref 3.5–5.0)
Alkaline Phosphatase: 67 U/L (ref 38–126)
Anion gap: 16 — ABNORMAL HIGH (ref 5–15)
BUN: 52 mg/dL — ABNORMAL HIGH (ref 8–23)
CO2: 23 mmol/L (ref 22–32)
Calcium: 8.3 mg/dL — ABNORMAL LOW (ref 8.9–10.3)
Chloride: 99 mmol/L (ref 98–111)
Creatinine, Ser: 10.75 mg/dL — ABNORMAL HIGH (ref 0.61–1.24)
GFR, Estimated: 5 mL/min — ABNORMAL LOW (ref >60)
Glucose, Bld: 90 mg/dL (ref 70–99)
Potassium: 3.9 mmol/L (ref 3.5–5.1)
Sodium: 138 mmol/L (ref 135–145)
Total Bilirubin: 0.9 mg/dL (ref 0.3–1.2)
Total Protein: 6.6 g/dL (ref 6.5–8.1)

## 2021-02-24 LAB — CBC
HCT: 20.2 % — ABNORMAL LOW (ref 39.0–52.0)
Hemoglobin: 6.8 g/dL — CL (ref 13.0–17.0)
MCH: 34.3 pg — ABNORMAL HIGH (ref 26.0–34.0)
MCHC: 33.7 g/dL (ref 30.0–36.0)
MCV: 102 fL — ABNORMAL HIGH (ref 80.0–100.0)
Platelets: 125 10*3/uL — ABNORMAL LOW (ref 150–400)
RBC: 1.98 MIL/uL — ABNORMAL LOW (ref 4.22–5.81)
RDW: 22.7 % — ABNORMAL HIGH (ref 11.5–15.5)
WBC: 4.7 10*3/uL (ref 4.0–10.5)
nRBC: 1.7 % — ABNORMAL HIGH (ref 0.0–0.2)

## 2021-02-24 LAB — CBG MONITORING, ED
Glucose-Capillary: 100 mg/dL — ABNORMAL HIGH (ref 70–99)
Glucose-Capillary: 92 mg/dL (ref 70–99)

## 2021-02-24 LAB — MRSA NEXT GEN BY PCR, NASAL: MRSA by PCR Next Gen: NOT DETECTED

## 2021-02-24 LAB — GLUCOSE, CAPILLARY: Glucose-Capillary: 131 mg/dL — ABNORMAL HIGH (ref 70–99)

## 2021-02-24 LAB — HEMOGLOBIN AND HEMATOCRIT, BLOOD
HCT: 22.9 % — ABNORMAL LOW (ref 39.0–52.0)
Hemoglobin: 7.5 g/dL — ABNORMAL LOW (ref 13.0–17.0)

## 2021-02-24 LAB — PREPARE RBC (CROSSMATCH)

## 2021-02-24 SURGERY — ENTEROSCOPY
Anesthesia: Monitor Anesthesia Care

## 2021-02-24 MED ORDER — SODIUM CHLORIDE 0.9% IV SOLUTION
Freq: Once | INTRAVENOUS | Status: DC
Start: 2021-02-24 — End: 2021-02-26

## 2021-02-24 MED ORDER — EPINEPHRINE 1 MG/10ML IJ SOSY
PREFILLED_SYRINGE | INTRAMUSCULAR | Status: AC
  Filled 2021-02-24: qty 10

## 2021-02-24 MED ORDER — SODIUM CHLORIDE (PF) 0.9 % IJ SOLN
PREFILLED_SYRINGE | INTRAMUSCULAR | Status: DC | PRN
  Administered 2021-02-24: 3 mL

## 2021-02-24 MED ORDER — LIDOCAINE 2% (20 MG/ML) 5 ML SYRINGE
INTRAMUSCULAR | Status: DC | PRN
Start: 2021-02-24 — End: 2021-02-24
  Administered 2021-02-24: 40 mg via INTRAVENOUS

## 2021-02-24 MED ORDER — PHENYLEPHRINE 40 MCG/ML (10ML) SYRINGE FOR IV PUSH (FOR BLOOD PRESSURE SUPPORT)
PREFILLED_SYRINGE | INTRAVENOUS | Status: DC | PRN
  Administered 2021-02-24: 80 ug via INTRAVENOUS

## 2021-02-24 MED ORDER — GABAPENTIN 300 MG PO CAPS
300.0000 mg | ORAL_CAPSULE | Freq: Every day | ORAL | Status: DC
  Administered 2021-02-25 – 2021-02-27 (×3): 300 mg via ORAL
  Filled 2021-02-24 (×3): qty 1

## 2021-02-24 MED ORDER — GLUCAGON HCL RDNA (DIAGNOSTIC) 1 MG IJ SOLR
INTRAMUSCULAR | Status: DC | PRN
  Administered 2021-02-24 (×2): .5 mg via INTRAVENOUS

## 2021-02-24 MED ORDER — PROPOFOL 500 MG/50ML IV EMUL
INTRAVENOUS | Status: DC | PRN
  Administered 2021-02-24: 125 ug/kg/min via INTRAVENOUS

## 2021-02-24 MED ORDER — PROPOFOL 10 MG/ML IV BOLUS
INTRAVENOUS | Status: DC | PRN
  Administered 2021-02-24 (×2): 20 mg via INTRAVENOUS

## 2021-02-24 NOTE — Anesthesia Postprocedure Evaluation (Signed)
Anesthesia Post Note  Patient: Leslye Peer Mastrianni  Procedure(s) Performed: ENTEROSCOPY HOT HEMOSTASIS (ARGON PLASMA COAGULATION/BICAP) SCLEROTHERAPY BIOPSY     Patient location during evaluation: Endoscopy Anesthesia Type: MAC Level of consciousness: awake and alert Pain management: pain level controlled Vital Signs Assessment: post-procedure vital signs reviewed and stable Respiratory status: spontaneous breathing, nonlabored ventilation, respiratory function stable and patient connected to nasal cannula oxygen Cardiovascular status: stable and blood pressure returned to baseline Postop Assessment: no apparent nausea or vomiting Anesthetic complications: no   No notable events documented.  Last Vitals:  Vitals:   02/24/21 1619 02/24/21 1915  BP:  (!) 97/52  Pulse: 92 95  Resp: 18 17  Temp: 36.9 C 36.8 C  SpO2: 96% 92%    Last Pain:  Vitals:   02/24/21 1915  TempSrc: Oral  PainSc:                  Catalina Gravel

## 2021-02-24 NOTE — ED Notes (Signed)
Pt alert, NAD, calm, interactive, resps e/u, speaking in clear complete sentences, verbalizes no removable hardware in mouth, last ate/ drank yesterday, report given to Endo by phone. PT speaking on phone with family. VSS. BP elevated.

## 2021-02-24 NOTE — Anesthesia Preprocedure Evaluation (Addendum)
Anesthesia Evaluation  Patient identified by MRN, date of birth, ID band Patient awake    Reviewed: Allergy & Precautions, NPO status , Patient's Chart, lab work & pertinent test results  Airway Mallampati: II  TM Distance: >3 FB Neck ROM: Full    Dental  (+) Teeth Intact, Dental Advisory Given, Missing, Poor Dentition,    Pulmonary Current Smoker and Patient abstained from smoking.,    Pulmonary exam normal breath sounds clear to auscultation       Cardiovascular hypertension, Pt. on medications Normal cardiovascular exam Rhythm:Regular Rate:Normal     Neuro/Psych negative neurological ROS     GI/Hepatic PUD, GERD  Medicated,(+)     substance abuse  alcohol use and cocaine use, Hepatitis -, C  Endo/Other  diabetes, Type 2Obesity   Renal/GU ESRF and DialysisRenal disease     Musculoskeletal negative musculoskeletal ROS (+)   Abdominal   Peds  Hematology  (+) Blood dyscrasia (Thrombocytopenia), anemia ,   Anesthesia Other Findings Day of surgery medications reviewed with the patient.  Reproductive/Obstetrics                            Anesthesia Physical Anesthesia Plan  ASA: 3  Anesthesia Plan: MAC   Post-op Pain Management:    Induction: Intravenous  PONV Risk Score and Plan: 0 and Propofol infusion and Treatment may vary due to age or medical condition  Airway Management Planned: Nasal Cannula and Natural Airway  Additional Equipment:   Intra-op Plan:   Post-operative Plan:   Informed Consent: I have reviewed the patients History and Physical, chart, labs and discussed the procedure including the risks, benefits and alternatives for the proposed anesthesia with the patient or authorized representative who has indicated his/her understanding and acceptance.     Dental advisory given  Plan Discussed with: CRNA and Anesthesiologist  Anesthesia Plan Comments:         Anesthesia Quick Evaluation

## 2021-02-24 NOTE — Consult Note (Signed)
WOC Nurse Consult Note: Reason for Consult:Chronic, nonhealing full thickness wound on posterior left LE Wound type: full thickness Pressure Injury POA: Yes Measurement: Per Bedside RN, full thickness wound is dry, red, and measures approximately 3cm x 2cm x 0.2cm Wound bed:As noted above Drainage (amount, consistency, odor) None Periwound:discolored and with evidence of some contraction (healing) Dressing procedure/placement/frequency: This area is chronic and direct pressure to it creates discomfort. The patient instinctively avoids/prevents pressure from touching the area, so a boot is not needed/will not be provided. Topical care will be aimed at providing a moisture retentive wound environment conducive to wound repair as well as being antimicrobial and nonadherent.  Xeroform gauze topped with dry gauze and covered with an ABD pad for cushioning and secured with a few turns of Kerlix roll gauze is ordered.  Daily changes are indicated to prevent the folded xeroform from drying out.   [facility] nursing team will not follow, but will remain available to this patient, the nursing and medical teams.  Please re-consult if needed. Thanks, Maudie Flakes, MSN, RN, [place], Arther Abbott  Pager# [phone]

## 2021-02-24 NOTE — Progress Notes (Signed)
PROGRESS NOTE    Daniel [name]  WGN:[id number] DOB: 12/22/1960 DOA: 02/23/2021 PCP: [name], NP  Brief Narrative:Daniel [name] is a 60/M w ESRD on HD TTS, alcohol and cocaine use, known history of peptic ulcer-most recent EGD on 10/14 and AVMs T2DM, anemia, hepatitis C who presented to ED after having episode of weakness, black tarry stools for few days. Denies active alcohol or NSAID use at this time  -In the ED hemoglobin was 4.8, he was transfused 2 units of PRBC and started on a Protonix drip   Assessment & Plan:   Upper GI bleed Acute blood loss anemia, hemoglobin 4.8 on admission -History of peptic ulcer disease and duodenal/jejunal AVMs -Transfused 2 units of PRBC, monitor hemoglobin -Gastroenterology following for EGD with enteroscopy today -Continue IV PPI -Will likely need more PRBCs  ESRD on hemodialysis -Completed half of his HD yesterday, nephrology following  Transaminitis Chronic HCV -History of alcohol use -Trend LFTs  Type 2 diabetes mellitus -Diet controlled, HbA1c A1c was 5.4 in June  Essential hypertension -BP is soft, clonidine on hold, restart Norvasc  History of alcohol, cocaine and tobacco abuse -Counseled  L heel wound -WoC consult  DVT prophylaxis: SCDs Code Status: Full code Family Communication: Discussed patient in detail, no family at bedside Disposition Plan:  Inpatient appropriate due to severity of upper GI bleed, acute blood loss anemia    Consultants:  GI  Procedures:   Antimicrobials:    Subjective:   Objective: Vitals:   02/24/21 1020 02/24/21 1028 02/24/21 1044 02/24/21 1048  BP: (!) 149/79 (!) 157/93 124/63 (!) 142/74  Pulse: 91 91 91 82  Resp: (!) 21 19 20 20   Temp: 97.8 F (36.6 C)     TempSrc: Temporal     SpO2: 99% 96% 96% 99%  Weight:      Height:        Intake/Output Summary (Last 24 hours) at 02/24/2021 1137 Last data filed at 02/24/2021 1010 Gross per 24 hour  Intake 1183.67  ml  Output --  Net 1183.67 ml   Filed Weights   02/23/21 0930 02/23/21 0936  Weight: 104.3 kg 104.3 kg    Examination: Gen: Awake, Alert, Oriented X 3, no distress HEENT: no JVD Lungs: Good air movement bilaterally, CTAB CVS: S1S2/RRR Abd: soft, Non tender, non distended, BS present Extremities: LUE fistula/graft, L heel wound w/ dressing Skin: hyperpigmented lesions on legs, heel wound w/ dressing   Data Reviewed:   CBC: Recent Labs  Lab 02/23/21 1002 02/23/21 1520 02/23/21 2204 02/24/21 0533  WBC 5.4 4.8 5.1 4.7  NEUTROABS 3.1  --   --   --   HGB 5.0* 4.8* 7.1* 6.8*  HCT 15.7* 14.9* 21.3* 20.2*  MCV 112.9* 112.9* 102.9* 102.0*  PLT 137* 129* 114* 784*   Basic Metabolic Panel: Recent Labs  Lab 02/23/21 0948 02/23/21 1316 02/24/21 0533  NA 140  --  138  K 3.7  --  3.9  CL 96*  --  99  CO2 29  --  23  GLUCOSE 98  --  90  BUN 44*  --  52*  CREATININE 9.33*  --  10.75*  CALCIUM 8.2*  --  8.3*  MG  --  2.1  --    GFR: Estimated Creatinine Clearance: 8.9 mL/min (A) (by C-G formula based on SCr of 10.75 mg/dL (H)). Liver Function Tests: Recent Labs  Lab 02/23/21 0948 02/24/21 0533  AST 79* 69*  ALT 24 21  ALKPHOS  69 67  BILITOT 0.9 0.9  PROT 7.5 6.6  ALBUMIN 2.6* 2.3*   Recent Labs  Lab 02/23/21 0948  LIPASE 108*   No results for input(s): AMMONIA in the last 168 hours. Coagulation Profile: No results for input(s): INR, PROTIME in the last 168 hours. Cardiac Enzymes: No results for input(s): CKTOTAL, CKMB, CKMBINDEX, TROPONINI in the last 168 hours. BNP (last 3 results) No results for input(s): PROBNP in the last 8760 hours. HbA1C: No results for input(s): HGBA1C in the last 72 hours. CBG: Recent Labs  Lab 02/23/21 0939 02/23/21 1729 02/23/21 2224 02/24/21 0816  GLUCAP 89 106* 94 100*   Lipid Profile: No results for input(s): CHOL, HDL, LDLCALC, TRIG, CHOLHDL, LDLDIRECT in the last 72 hours. Thyroid Function Tests: No results for  input(s): TSH, T4TOTAL, FREET4, T3FREE, THYROIDAB in the last 72 hours. Anemia Panel: No results for input(s): VITAMINB12, FOLATE, FERRITIN, TIBC, IRON, RETICCTPCT in the last 72 hours. Urine analysis:    Component Value Date/Time   COLORURINE YELLOW 07/31/2019 2128   APPEARANCEUR CLEAR 07/31/2019 2128   LABSPEC 1.009 07/31/2019 2128   PHURINE 5.0 07/31/2019 2128   GLUCOSEU NEGATIVE 07/31/2019 2128   HGBUR SMALL (A) 07/31/2019 2128   BILIRUBINUR NEGATIVE 07/31/2019 2128   KETONESUR NEGATIVE 07/31/2019 2128   PROTEINUR 100 (A) 07/31/2019 2128   UROBILINOGEN 0.2 12/31/2009 0746   NITRITE NEGATIVE 07/31/2019 2128   LEUKOCYTESUR NEGATIVE 07/31/2019 2128   Sepsis Labs: @LABRCNTIP (procalcitonin:4,lacticidven:4)  ) Recent Results (from the past 240 hour(s))  Resp Panel by RT-PCR (Flu A&B, Covid) Nasopharyngeal Swab     Status: None   Collection Time: 02/23/21  9:49 AM   Specimen: Nasopharyngeal Swab; Nasopharyngeal(NP) swabs in vial transport medium  Result Value Ref Range Status   SARS Coronavirus 2 by RT PCR NEGATIVE NEGATIVE Final    Comment: (NOTE) SARS-CoV-2 target nucleic acids are NOT DETECTED.  The SARS-CoV-2 RNA is generally detectable in upper respiratory specimens during the acute phase of infection. The lowest concentration of SARS-CoV-2 viral copies this assay can detect is 138 copies/mL. A negative result does not preclude SARS-Cov-2 infection and should not be used as the sole basis for treatment or other patient management decisions. A negative result may occur with  improper specimen collection/handling, submission of specimen other than nasopharyngeal swab, presence of viral mutation(s) within the areas targeted by this assay, and inadequate number of viral copies(<138 copies/mL). A negative result must be combined with clinical observations, patient history, and epidemiological information. The expected result is Negative.  Fact Sheet for Patients:   EntrepreneurPulse.com.au  Fact Sheet for Healthcare Providers:  IncredibleEmployment.be  This test is no t yet approved or cleared by the [place] FDA and  has been authorized for detection and/or diagnosis of SARS-CoV-2 by FDA under an Emergency Use Authorization (EUA). This EUA will remain  in effect (meaning this test can be used) for the duration of the COVID-19 declaration under Section 564(b)(1) of the Act, 21 U.S.C.section 360bbb-3(b)(1), unless the authorization is terminated  or revoked sooner.       Influenza A by PCR NEGATIVE NEGATIVE Final   Influenza B by PCR NEGATIVE NEGATIVE Final    Comment: (NOTE) The Xpert Xpress SARS-CoV-2/FLU/RSV plus assay is intended as an aid in the diagnosis of influenza from Nasopharyngeal swab specimens and should not be used as a sole basis for treatment. Nasal washings and aspirates are unacceptable for Xpert Xpress SARS-CoV-2/FLU/RSV testing.  Fact Sheet for Patients: EntrepreneurPulse.com.au  Fact Sheet for Healthcare  Providers: IncredibleEmployment.be  This test is not yet approved or cleared by the [place] and has been authorized for detection and/or diagnosis of SARS-CoV-2 by FDA under an Emergency Use Authorization (EUA). This EUA will remain in effect (meaning this test can be used) for the duration of the COVID-19 declaration under Section 564(b)(1) of the Act, 21 U.S.C. section 360bbb-3(b)(1), unless the authorization is terminated or revoked.  Performed at [facility], [place] [address], [place], [place] [address]   Culture, blood (routine x 2)     Status: None (Preliminary result)   Collection Time: 02/23/21 10:02 AM   Specimen: BLOOD RIGHT HAND  Result Value Ref Range Status   Specimen Description BLOOD RIGHT HAND  Final   Special Requests   Final    BOTTLES DRAWN AEROBIC AND ANAEROBIC Blood Culture adequate volume    Culture   Final    NO GROWTH < 24 HOURS Performed at [facility], [place] [address] [place], [place] [address]    Report Status PENDING  Incomplete  Culture, blood (routine x 2)     Status: None (Preliminary result)   Collection Time: 02/23/21 11:55 AM   Specimen: BLOOD  Result Value Ref Range Status   Specimen Description BLOOD RIGHT ANTECUBITAL  Final   Special Requests   Final    BOTTLES DRAWN AEROBIC AND ANAEROBIC Blood Culture results may not be optimal due to an inadequate volume of blood received in culture bottles   Culture   Final    NO GROWTH < 24 HOURS Performed at [facility], [place] [address], [place], [place] [address]    Report Status PENDING  Incomplete         Radiology Studies: DG Chest Port 1 View  Result Date: 02/23/2021 CLINICAL DATA:  Syncope EXAM: PORTABLE CHEST 1 VIEW COMPARISON:  Previous studies including the examination of 02/07/2021 FINDINGS: Transverse diameter of heart is increased. There are no signs of pulmonary edema. There is no focal pulmonary consolidation. There is no significant pleural effusion or pneumothorax. IMPRESSION: Cardiomegaly. There are no signs of pulmonary edema or focal pulmonary consolidation. Electronically Signed   By: Elmer Picker M.D.   On: 02/23/2021 11:08        Scheduled Meds:  amLODipine  10 mg Oral Daily   calcium acetate  1,334 mg Oral TID with meals   [START ON 02/26/2021] darbepoetin (ARANESP) injection - DIALYSIS  200 mcg Intravenous Q Tue-HD   doxepin  10 mg Oral QHS   feeding supplement (NEPRO CARB STEADY)  237 mL Oral BID BM   folic acid  1 mg Oral Daily   gabapentin  100 mg Oral QHS   gabapentin  300 mg Oral TID   hydrOXYzine  25 mg Oral BID   insulin aspart  0-6 Units Subcutaneous TID WC   lanthanum  1,000 mg Oral TID WC   loratadine  10 mg Oral Daily   multivitamin with minerals  1 tablet Oral Daily   nicotine  21 mg Transdermal Daily   pantoprazole (PROTONIX) IV  40 mg Intravenous  Q12H   sodium chloride flush  3 mL Intravenous Q12H   sucralfate  1 g Oral Daily   thiamine  100 mg Oral Daily   Continuous Infusions:  sodium chloride Stopped (02/23/21 1219)   sodium chloride     [START ON 02/26/2021] sodium thiosulfate infusion for calciphylaxis       LOS: 1 day    Time  spent: 20min  Domenic Polite, MD [facility]   02/24/2021, 11:37 AM

## 2021-02-24 NOTE — Progress Notes (Signed)
Subjective: Back from EGD, still in ER, complaining of being hungry and some left leg discomfort from calciphylaxis area.  No shortness of breath or chest pain  Objective Vital signs in last 24 hours: Vitals:   02/24/21 1044 02/24/21 1048 02/24/21 1100 02/24/21 1200  BP: 124/63 (!) 142/74 (!) 159/79 (!) 141/62  Pulse: 91 82 86 87  Resp: 20 20 16 17   Temp:      TempSrc:      SpO2: 96% 99% 99% 99%  Weight:      Height:       Weight change:   Physical Exam: General: Alert obese African-American male NAD Heart: RRR, no MRG Lungs: CTA nonlabored breathing Abdomen: Obese, NABS, nontender nondistended  Extremities: Bilateral pedal edema trace With scattered hyperpigmented macular rash on lower extremities and left calf with dry dark ulcerated lesion, also has a left heel wound with dressing on Dialysis Access: Positive bruit LUA aVF   Dialysis Orders: Center: [facility], TTS 4-hour, EDW 131 kg, [place] 2K 2 CA No heparin  Mircera 225 MCG every 2 weeks, last dose 01/29/2021 Hectorol 1 mcg q. HD Sodium thiosulfate 25 g q. dialysis  L UA  aVF      Problem/Plan: Symptomatic anemia with recurrent GI bleed= Hgb 5.0 , 4.8 on admit =per status post transfusion 2 units  packed red blood cells 7.1 then  6.8 serial Hgb's, per admit and transfusion as needed.  Status post EGD, result pending, no heparin with dialysis ESRD -HD TTS, a.m. K3.9, 100% O2 sat on room air no emergent need for dialysis today, follow-up a.m. labs, tentatively  plan for next dialysis on schedule Tues. 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  Last outpatient phosphorus was 7, also DC vitamin D with calciphylaxis Nutrition -albumin 2.3 use protein supplements when taking PTO's Left heel wound= per admit wound care to see proximal calciphylaxis also Transaminitis-, history of chronic hep C /history of alcohol use . Diabetes mellitus type 2 =per admit History of substance abuse, alcohol, cocaine and tobacco= counseled per admit  Ernest Haber, PA-C [facility] [place] [phone] 02/24/2021,12:40 PM  LOS: 1 day   Labs: Basic Metabolic Panel: Recent Labs  Lab 02/23/21 0948 02/24/21 0533  NA 140 138  K 3.7 3.9  CL 96* 99  CO2 29 23  GLUCOSE 98 90  BUN 44* 52*  CREATININE 9.33* 10.75*  CALCIUM 8.2* 8.3*   Liver Function Tests: Recent Labs  Lab 02/23/21 0948 02/24/21 0533  AST 79* 69*  ALT 24 21  ALKPHOS 69 67  BILITOT 0.9 0.9  PROT 7.5 6.6  ALBUMIN 2.6* 2.3*   Recent Labs  Lab 02/23/21 0948  LIPASE 108*   No results for input(s): AMMONIA in the last 168 hours. CBC: Recent Labs  Lab 02/23/21 1002 02/23/21 1520 02/23/21 2204 02/24/21 0533  WBC 5.4 4.8 5.1 4.7  NEUTROABS 3.1  --   --   --  HGB 5.0* 4.8* 7.1* 6.8*  HCT 15.7* 14.9* 21.3* 20.2*  MCV 112.9* 112.9* 102.9* 102.0*  PLT 137* 129* 114* 125*   Cardiac Enzymes: No results for input(s): CKTOTAL, CKMB, CKMBINDEX, TROPONINI in the last 168 hours. CBG: Recent Labs  Lab 02/23/21 0939 02/23/21 1729 02/23/21 2224 02/24/21 0816 02/24/21 1219  GLUCAP 89 106* 94 100* 92    Studies/Results: DG Chest Port 1 View  Result Date: 02/23/2021 CLINICAL DATA:  Syncope EXAM: PORTABLE CHEST 1 VIEW COMPARISON:  Previous studies including the examination of 02/07/2021 FINDINGS: Transverse diameter of heart is  increased. There are no signs of pulmonary edema. There is no focal pulmonary consolidation. There is no significant pleural effusion or pneumothorax. IMPRESSION: Cardiomegaly. There are no signs of pulmonary edema or focal pulmonary consolidation. Electronically Signed   By: Elmer Picker M.D.   On: 02/23/2021 11:08   Medications:  sodium chloride Stopped (02/23/21 1219)   sodium chloride     [START ON 02/26/2021] sodium thiosulfate infusion for calciphylaxis      sodium chloride   Intravenous Once   amLODipine  10 mg Oral Daily   calcium acetate  1,334 mg Oral TID with meals   [START ON 02/26/2021] darbepoetin (ARANESP) injection - DIALYSIS  200 mcg Intravenous Q Tue-HD   doxepin  10 mg Oral QHS   feeding supplement (NEPRO CARB STEADY)  237 mL Oral BID BM   folic acid  1 mg Oral Daily   [START ON 02/25/2021] gabapentin  300 mg Oral QHS   hydrOXYzine  25 mg Oral BID   insulin aspart  0-6 Units Subcutaneous TID WC   lanthanum  1,000 mg Oral TID WC   loratadine  10 mg Oral Daily   multivitamin with minerals  1 tablet Oral Daily   nicotine  21 mg Transdermal Daily   pantoprazole (PROTONIX) IV  40 mg Intravenous Q12H   sodium chloride flush  3 mL Intravenous Q12H   sucralfate  1 g Oral Daily   thiamine  100 mg Oral Daily

## 2021-02-24 NOTE — Evaluation (Signed)
Physical Therapy Evaluation Patient Details Name: Daniel [name] MRN: [id number] DOB: 1960-07-19 Today's Date: 02/24/2021  History of Present Illness  The pt is a 60 yo male presenting to ED 10/29 with weakness and x1 week of dark/black stools. Upon work up, Hgb 5.0 and pt given 2 units PRBC. PMH includes: anemia, DM II, ESRD on HD, HTN, ICH, and alcohol and cocaine use.   Clinical Impression  Pt in bed upon arrival of PT, agreeable to evaluation at this time. Prior to admission the pt was completely independent without need for AD, reports he was living in a rooming house, but is still driving as needed for any errands or appointments. The pt now presents with limitations in functional mobility and endurance due to above dx, and will continue to benefit from skilled PT to address these deficits. The pt reports he is limited to ~30 ft ambulation at baseline due to poor endurance. Will continue to benefit from skilled PT acutely to progress ambulation distance and train on DME as needed to improve pt ability to mobilize in community safely.      Recommendations for follow up therapy are one component of a multi-disciplinary discharge planning process, led by the attending physician.  Recommendations may be updated based on patient status, additional functional criteria and insurance authorization.  Follow Up Recommendations No PT follow up    Assistance Recommended at Discharge PRN  Functional Status Assessment Patient has had a recent decline in their functional status and demonstrates the ability to make significant improvements in function in a reasonable and predictable amount of time.  Equipment Recommendations  Rollator (4 wheels)    Recommendations for Other Services       Precautions / Restrictions Precautions Precautions: Fall Restrictions Weight Bearing Restrictions: No      Mobility  Bed Mobility Overal bed mobility: Modified Independent             General bed  mobility comments: HOB up    Transfers Overall transfer level: Modified independent Equipment used: None               General transfer comment: pt able to power up from low surface and EOB without assist and with good stability. uses wide BOS    Ambulation/Gait Ambulation/Gait assistance: Supervision Gait Distance (Feet): 15 Feet Assistive device: None Gait Pattern/deviations: Step-through pattern;Wide base of support Gait velocity: decr Gait velocity interpretation: <1.31 ft/sec, indicative of household ambulator General Gait Details: no LOB or instability, but pt with slowed speed and reports limited by fatigue at this time     Balance Overall balance assessment: Mild deficits observed, not formally tested                                           Pertinent Vitals/Pain Pain Assessment: Faces Faces Pain Scale: Hurts little more Pain Location: wounds on LLE Pain Descriptors / Indicators: Discomfort Pain Intervention(s): Limited activity within patient's tolerance;Monitored during session;Repositioned    Home Living Family/patient expects to be discharged to:: Group home (pt refers to it as "rooming house") Living Arrangements: Other (Comment) (rooming house) Available Help at Discharge: Friend(s);Available PRN/intermittently Type of Home: House Home Access: Level entry       Home Layout: One level Home Equipment: None Additional Comments: information above different from information charted earlier this month, may need to verify    Prior Function Prior Level  of Function : Independent/Modified Independent;Driving             Mobility Comments: pt reports independence without AD, driving to run all errands independently       Hand Dominance   Dominant Hand: Right    Extremity/Trunk Assessment   Upper Extremity Assessment Upper Extremity Assessment: Overall WFL for tasks assessed (baseline deficits in R shoulder, but able to reach  overhead and pt states no impact on daily activities)    Lower Extremity Assessment Lower Extremity Assessment: Overall WFL for tasks assessed (poor endurance, but good power functionally and to MMT)    Cervical / Trunk Assessment Cervical / Trunk Assessment: Normal;Other exceptions Cervical / Trunk Exceptions: obesity  Communication   Communication: No difficulties  Cognition Arousal/Alertness: Awake/alert Behavior During Therapy: Flat affect Overall Cognitive Status: Within Functional Limits for tasks assessed                                 General Comments: pt able to follow all cues/commands, not formally tested        General Comments General comments (skin integrity, edema, etc.): VSS on RA, RN about to start 2 units PRBC    Exercises     Assessment/Plan    PT Assessment Patient needs continued PT services  PT Problem List Decreased strength;Decreased activity tolerance;Decreased balance;Cardiopulmonary status limiting activity       PT Treatment Interventions DME instruction;Gait training;Stair training;Functional mobility training;Therapeutic activities;Balance training;Therapeutic exercise;Patient/family education    PT Goals (Current goals can be found in the Care Plan section)  Acute Rehab PT Goals Patient Stated Goal: to get his energy back PT Goal Formulation: With patient Time For Goal Achievement: 03/10/21 Potential to Achieve Goals: Good    Frequency Min 2X/week (pt close to baseline, likely does not need many visits of PT to regain independence)    AM-PAC PT "6 Clicks" Mobility  Outcome Measure Help needed turning from your back to your side while in a flat bed without using bedrails?: None Help needed moving from lying on your back to sitting on the side of a flat bed without using bedrails?: None Help needed moving to and from a bed to a chair (including a wheelchair)?: None Help needed standing up from a chair using your arms  (e.g., wheelchair or bedside chair)?: None Help needed to walk in hospital room?: A Little Help needed climbing 3-5 steps with a railing? : A Little 6 Click Score: 22    End of Session Equipment Utilized During Treatment: Gait belt Activity Tolerance: Patient tolerated treatment well Patient left: in bed;with call bell/phone within reach;with nursing/sitter in room Nurse Communication: Mobility status PT Visit Diagnosis: Muscle weakness (generalized) (M62.81)    Time: 7106-2694 PT Time Calculation (min) (ACUTE ONLY): 17 min   Charges:   PT Evaluation $PT Eval Low Complexity: 1 Low          West Carbo, PT, DPT   Acute Rehabilitation Department Pager #: [phone]  Sandra Cockayne 02/24/2021, 1:36 PM

## 2021-02-24 NOTE — Op Note (Signed)
[facility] Patient Name: Nachum Derossett Procedure Date : 02/24/2021 MRN: [id number] Attending MD: Gladstone Pih. Candis Schatz , MD Date of Birth: 28-Oct-1960 CSN: [id number] Age: 60 Admit Type: Inpatient Procedure:                Small bowel enteroscopy Indications:              Melena, Arteriovenous malformation in the small                            intestine Providers:                Deva Ron E. Candis Schatz, MD, Doristine Johns, RN,                            Jaci Carrel, RN, [facility] Technician,                            Technician, Clearnce Sorrel, CRNA Referring MD:              Medicines:                Monitored Anesthesia Care Complications:            No immediate complications. Estimated Blood Loss:     Estimated blood loss was minimal. Procedure:                Pre-Anesthesia Assessment:                           - Prior to the procedure, a History and Physical                            was performed, and patient medications and                            allergies were reviewed. The patient's tolerance of                            previous anesthesia was also reviewed. The risks                            and benefits of the procedure and the sedation                            options and risks were discussed with the patient.                            All questions were answered, and informed consent                            was obtained. Prior Anticoagulants: The patient has                            taken no previous anticoagulant or antiplatelet                            agents.  ASA Grade Assessment: III - A patient with                            severe systemic disease. After reviewing the risks                            and benefits, the patient was deemed in                            satisfactory condition to undergo the procedure.                           After obtaining informed consent, the endoscope was                            passed  under direct vision. Throughout the                            procedure, the patient's blood pressure, pulse, and                            oxygen saturations were monitored continuously. The                            PCF-H190TL ([id number] Olympus slim colonoscope was                            introduced through the mouth and advanced to the                            proximal jejunum. The small bowel enteroscopy was                            somewhat difficult due to poor endoscopic                            visualization. Successful completion of the                            procedure was aided by administering glucagon 0.5                            mg IV x 2. The patient tolerated the procedure well. Scope In: Scope Out: Findings:      Localized mucosal changes characterized by sloughing were found in the       lower third of the esophagus. Biopsies were taken with a cold forceps       for histology. Estimated blood loss was minimal.      Diffuse mild inflammation characterized by congestion (edema) and       granularity was found in the gastric body.      The exam of the stomach was otherwise normal.      A single hemoclip was seen prolapsing into the duodenal bulb. Multiple 4       to 7 mm sessile polyps with  no bleeding were found in the duodenal bulb.       Biopsies were taken with a cold forceps for histology. Estimated blood       loss was minimal.      A single 15 mm nodule was found in the second portion of the duodenum.       This lesion was in the area of the ampulla, but the papilla was not       definitively identified. Biopsies were taken with a cold forceps for       histology. Estimated blood loss was minimal.      Three angioectasias, one of which was actively bleeding were found, one       in the second portion of the duodenum and two in the third portion of       the duodenum. The actively bleeding AVM was difficult to visualize due       to its location  between two prominent folds. Area was successfully       injected with 3 mL of a 1:10,000 solution of epinephrine for hemostasis.       Coagulation for hemostasis using argon plasma at 0.5 liters/minute and       20 watts was successful. The other two non-bleeding AVMs were treated       with APC coagulation without epinephrine. Estimated blood loss was       minimal.      The exam of the duodenum was otherwise normal.      Exam of the jejunum was otherwise normal. Impression:               - Mucosal changes in the esophagus. Biopsied.                           - Gastritis. Previously diagnosed and treated as H.                            pylori gastritis. The previously noted gastric                            ulcers have healed.                           - Multiple duodenal polyps. Suspect these are                            reactive/inflammatory. Biopsied.                           - Duodenal nodule(s). Biopsied. Suspect this is the                            adenoma previous biopsied in 2021.                           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 Treated with argon plasma  coagulation (APC). Injected. Recommendation:           - Return patient to hospital ward for ongoing care.                           - Resume previous diet.                           - Use Protonix (pantoprazole) 40 mg PO BID.                           - Await pathology results.                           - Patient will need outpatient follow up with Dr.                            Havery Moros.                           - Trend CBC q12 hours. Tentative discharge tomorrow                            if stable hgb and no evidence of recurrent bleeding. Procedure Code(s):        --- Professional ---                           [id number], 71, Small intestinal endoscopy, enteroscopy                            beyond second portion  of duodenum, not including                            ileum; with control of bleeding (eg, injection,                            bipolar cautery, unipolar cautery, laser, heater                            probe, stapler, plasma coagulator)                           44361, Small intestinal endoscopy, enteroscopy                            beyond second portion of duodenum, not including                            ileum; with biopsy, single or multiple Diagnosis Code(s):        --- Professional ---                           K22.8, Other specified diseases of esophagus                           K29.70, Gastritis, unspecified, without bleeding  K31.7, Polyp of stomach and duodenum                           K31.89, Other diseases of stomach and duodenum                           K31.811, Angiodysplasia of stomach and duodenum                            with bleeding                           K92.1, Melena (includes Hematochezia) CPT copyright 2019 [employer] Association. All rights reserved. The codes documented in this report are preliminary and upon coder review may  be revised to meet current compliance requirements. Myla Mauriello E. Candis Schatz, MD 02/24/2021 10:41:05 AM This report has been signed electronically. Number of Addenda: 0

## 2021-02-24 NOTE — ED Notes (Signed)
Pt assisted to bedside commode. Unable to produce ane urine. Urinal at bedside.

## 2021-02-24 NOTE — ED Notes (Signed)
To Endo.

## 2021-02-24 NOTE — Transfer of Care (Signed)
Immediate Anesthesia Transfer of Care Note  Patient: Daniel [name]  Procedure(s) Performed: ENTEROSCOPY HOT HEMOSTASIS (ARGON PLASMA COAGULATION/BICAP) SCLEROTHERAPY BIOPSY  Patient Location: Endoscopy Unit  Anesthesia Type:MAC  Level of Consciousness: awake, alert  and oriented  Airway & Oxygen Therapy: Patient Spontanous Breathing  Post-op Assessment: Report given to RN and Post -op Vital signs reviewed and stable  Post vital signs: Reviewed and stable  Last Vitals:  Vitals Value Taken Time  BP 149/79 02/24/21 1020  Temp    Pulse 92 02/24/21 1020  Resp 21 02/24/21 1020  SpO2 97 % 02/24/21 1020  Vitals shown include unvalidated device data.  Last Pain:  Vitals:   02/24/21 0854  TempSrc: Temporal  PainSc: 0-No pain         Complications: No notable events documented.

## 2021-02-24 NOTE — Interval H&P Note (Signed)
History and Physical Interval Note:  Patient received 2 units pRBCs last night with appropriate rise to 7, repeat this am 6.8.  Patient feels a little better.  HD stable.  Plan for enteroscopy today to treat suspected bleeding AVMs  02/24/2021 9:07 AM  [name]  has presented today for surgery, with the diagnosis of Melena.  The various methods of treatment have been discussed with the patient and family. After consideration of risks, benefits and other options for treatment, the patient has consented to  Procedure(s): ENTEROSCOPY (N/A) as a surgical intervention.  The patient's history has been reviewed, patient examined, no change in status, stable for surgery.  I have reviewed the patient's chart and labs.  Questions were answered to the patient's satisfaction.     [name]

## 2021-02-25 ENCOUNTER — Encounter (HOSPITAL_COMMUNITY): Payer: Self-pay | Admitting: Gastroenterology

## 2021-02-25 ENCOUNTER — Telehealth: Payer: Self-pay

## 2021-02-25 DIAGNOSIS — K921 Melena: Secondary | ICD-10-CM | POA: Diagnosis not present

## 2021-02-25 LAB — CBC
HCT: 24.4 % — ABNORMAL LOW (ref 39.0–52.0)
Hemoglobin: 8.1 g/dL — ABNORMAL LOW (ref 13.0–17.0)
MCH: 33.8 pg (ref 26.0–34.0)
MCHC: 33.2 g/dL (ref 30.0–36.0)
MCV: 101.7 fL — ABNORMAL HIGH (ref 80.0–100.0)
Platelets: 137 10*3/uL — ABNORMAL LOW (ref 150–400)
RBC: 2.4 MIL/uL — ABNORMAL LOW (ref 4.22–5.81)
RDW: 23 % — ABNORMAL HIGH (ref 11.5–15.5)
WBC: 5.4 10*3/uL (ref 4.0–10.5)
nRBC: 1.7 % — ABNORMAL HIGH (ref 0.0–0.2)

## 2021-02-25 LAB — RENAL FUNCTION PANEL
Albumin: 2.3 g/dL — ABNORMAL LOW (ref 3.5–5.0)
Anion gap: 16 — ABNORMAL HIGH (ref 5–15)
BUN: 67 mg/dL — ABNORMAL HIGH (ref 8–23)
CO2: 24 mmol/L (ref 22–32)
Calcium: 8.7 mg/dL — ABNORMAL LOW (ref 8.9–10.3)
Chloride: 99 mmol/L (ref 98–111)
Creatinine, Ser: 12.15 mg/dL — ABNORMAL HIGH (ref 0.61–1.24)
GFR, Estimated: 4 mL/min — ABNORMAL LOW (ref >60)
Glucose, Bld: 151 mg/dL — ABNORMAL HIGH (ref 70–99)
Phosphorus: 8.1 mg/dL — ABNORMAL HIGH (ref 2.5–4.6)
Potassium: 4.5 mmol/L (ref 3.5–5.1)
Sodium: 139 mmol/L (ref 135–145)

## 2021-02-25 LAB — GLUCOSE, CAPILLARY
Glucose-Capillary: 134 mg/dL — ABNORMAL HIGH (ref 70–99)
Glucose-Capillary: 109 mg/dL — ABNORMAL HIGH (ref 70–99)
Glucose-Capillary: 164 mg/dL — ABNORMAL HIGH (ref 70–99)
Glucose-Capillary: 139 mg/dL — ABNORMAL HIGH (ref 70–99)
Glucose-Capillary: 96 mg/dL (ref 70–99)

## 2021-02-25 MED ORDER — HYDROCERIN EX CREA
TOPICAL_CREAM | Freq: Two times a day (BID) | CUTANEOUS | Status: DC
  Administered 2021-02-25 – 2021-02-27 (×2): 1 via TOPICAL
  Filled 2021-02-25: qty 113

## 2021-02-25 MED ORDER — CHLORHEXIDINE GLUCONATE CLOTH 2 % EX PADS
6.0000 | MEDICATED_PAD | Freq: Every day | CUTANEOUS | Status: DC
  Administered 2021-02-26 – 2021-02-28 (×3): 6 via TOPICAL

## 2021-02-25 MED ORDER — CAMPHOR-MENTHOL 0.5-0.5 % EX LOTN
TOPICAL_LOTION | CUTANEOUS | Status: DC | PRN
  Filled 2021-02-25: qty 222

## 2021-02-25 NOTE — Evaluation (Signed)
Occupational Therapy Evaluation Patient Details Name: Daniel [name] MRN: [id number] DOB: 1961/01/17 Today's Date: 02/25/2021   History of Present Illness The pt is a 60 yo male presenting to ED 10/29 with weakness and x1 week of dark/black stools. Upon work up, Hgb 5.0 and pt given 2 units PRBC. PMH includes: anemia, DM II, ESRD on HD, HTN, ICH, and alcohol and cocaine use.   Clinical Impression   Pt admitted with the above diagnosis and overall has returned to at or close to baseline with all adls. Pt lives in a group home and was independent there and driving.  Feel strength is improved after given 2 units of blood. Discussed safety at home and how to avoid falls and energy conservation.  No further acute OT needs at this time. Do feel, once medically stable, that pt can return to group home.       Recommendations for follow up therapy are one component of a multi-disciplinary discharge planning process, led by the attending physician.  Recommendations may be updated based on patient status, additional functional criteria and insurance authorization.   Follow Up Recommendations  No OT follow up    Assistance Recommended at Discharge PRN  Functional Status Assessment  Patient has had a recent decline in their functional status and demonstrates the ability to make significant improvements in function in a reasonable and predictable amount of time.  Equipment Recommendations  None recommended by OT    Recommendations for Other Services       Precautions / Restrictions Precautions Precautions: Fall Precaution Comments: Pt has fallen twice in last week due to weakness but reports no falls prior to this. Restrictions Weight Bearing Restrictions: No      Mobility Bed Mobility Overal bed mobility: Modified Independent             General bed mobility comments: HOB up    Transfers Overall transfer level: Modified independent Equipment used: None                General transfer comment: pt able to power up from low surface and EOB without assist and with good stability. uses wide BOS      Balance Overall balance assessment: Mild deficits observed, not formally tested                                         ADL either performed or assessed with clinical judgement   ADL Overall ADL's : At baseline                                       General ADL Comments: increased time and effort with LB ADL, declining DME for showering     Vision Baseline Vision/History: 1 Wears glasses Ability to See in Adequate Light: 0 Adequate Patient Visual Report: No change from baseline Vision Assessment?: No apparent visual deficits Additional Comments: wears glassses for reading     Perception     Praxis      Pertinent Vitals/Pain Pain Assessment: Faces Faces Pain Scale: Hurts little more Pain Location: wounds LLE and back Pain Descriptors / Indicators: Discomfort Pain Intervention(s): Monitored during session;Repositioned     Hand Dominance Right   Extremity/Trunk Assessment Upper Extremity Assessment Upper Extremity Assessment: RUE deficits/detail RUE Deficits / Details: PROM shoulder WFL.  AROM  to 90 degrees in shoulder due to pain. Pt states he is supposed to go to dr. on Thursday for shoulder pain RUE: Unable to fully assess due to pain RUE Sensation: WNL RUE Coordination: decreased gross motor   Lower Extremity Assessment Lower Extremity Assessment: Defer to PT evaluation   Cervical / Trunk Assessment Cervical / Trunk Assessment: Normal;Other exceptions Cervical / Trunk Exceptions: obesity   Communication Communication Communication: No difficulties   Cognition Arousal/Alertness: Awake/alert Behavior During Therapy: WFL for tasks assessed/performed Overall Cognitive Status: Within Functional Limits for tasks assessed                                 General Comments: pt able to  follow all cues/commands, not formally tested     General Comments  Pt appears to be at his baseline with adls. Feels stronger on feet and able to complete basic adls with rest breaks.    Exercises     Shoulder Instructions      Home Living Family/patient expects to be discharged to:: Group home Living Arrangements: Other (Comment) Available Help at Discharge: Friend(s);Available PRN/intermittently Type of Home: House Home Access: Level entry     Home Layout: One level     Bathroom Shower/Tub: Teacher, early years/pre: Standard     Home Equipment: None          Prior Functioning/Environment Prior Level of Function : Independent/Modified Independent;Driving             Mobility Comments: pt reports independence without AD, driving to run all errands independently ADLs Comments: cleans own room and cooks for self.        OT Problem List:        OT Treatment/Interventions:      OT Goals(Current goals can be found in the care plan section) Acute Rehab OT Goals Patient Stated Goal: to go home OT Goal Formulation: All assessment and education complete, DC therapy  OT Frequency:     Barriers to D/C:            Co-evaluation              AM-PAC OT "6 Clicks" Daily Activity     Outcome Measure Help from another person eating meals?: None Help from another person taking care of personal grooming?: None Help from another person toileting, which includes using toliet, bedpan, or urinal?: None Help from another person bathing (including washing, rinsing, drying)?: None Help from another person to put on and taking off regular upper body clothing?: None Help from another person to put on and taking off regular lower body clothing?: None 6 Click Score: 24   End of Session Nurse Communication: Mobility status  Activity Tolerance: Patient tolerated treatment well Patient left: in chair;with call bell/phone within reach  OT Visit Diagnosis:  Other abnormalities of gait and mobility (R26.89)                Time: 3474-2595 OT Time Calculation (min): 19 min Charges:  OT General Charges $OT Visit: 1 Visit OT Evaluation $OT Eval Low Complexity: 1 Low  Glenford Peers 02/25/2021, 9:11 AM

## 2021-02-25 NOTE — Plan of Care (Signed)
  Problem: Education: Goal: Knowledge of General Education information will improve Description: Including pain rating scale, medication(s)/side effects and non-pharmacologic comfort measures Outcome: Progressing   Problem: Clinical Measurements: Goal: Diagnostic test results will improve Outcome: Progressing   Problem: Nutrition: Goal: Adequate nutrition will be maintained Outcome: Progressing   Problem: Pain Managment: Goal: General experience of comfort will improve Outcome: Progressing

## 2021-02-25 NOTE — Progress Notes (Signed)
Subjective: seen in room, no new c/o's.   Objective Vital signs in last 24 hours: Vitals:   02/24/21 2306 02/25/21 0310 02/25/21 0733 02/25/21 1108  BP: 129/64 (!) 117/48 (!) 142/63 129/64  Pulse: 96 91  96  Resp: 19 13 19    Temp: 97.8 F (36.6 C) 97.9 F (36.6 C) 97.6 F (36.4 C) 97.9 F (36.6 C)  TempSrc: Oral Oral    SpO2: 93% 92%  94%  Weight:      Height:       Weight change: 25 kg  Physical Exam: General: Alert obese African-American male NAD Heart: RRR, no MRG Lungs: CTA nonlabored breathing Abdomen: Obese, NABS, nontender nondistended  Extremities: Bilateral pedal edema trace With scattered hyperpigmented macular rash on lower extremities w/ one left calf dark ulcerated lesion, also has a left heel wound with dressing on Dialysis Access: Positive bruit LUA aVF   OP HD: [facility]  4h  131kg  2/2 bath  Hep none LUA AVF   - mircera 225 q2, last 10/04  - hect 1ug IV tiw  - Na thiosulfate 25 gm tiw IV      Problem/Plan: Symptomatic anemia/ recurrent GI bleed- Hgb 5.0 on admit, sp prbc's. SP EGD. No heparin w/ dialysis.  ESRD -HD TTS. Next HD Tuesday.  Hypertension/volume - 2kg under dry wt, no vol excess on exam or CXR. UF as tolerated w/ next HD 11/01.  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 Phoslo dc'd as binder d/t Ca++ load, also dc'd hectorol. Fosrenol started as non Ca binder.  Nutrition -albumin 2.3 use protein supplements when taking PTO's Left heel wound-  per admit wound care to see Transaminitis--  history of chronic hep C /history of alcohol use . Diabetes mellitus type 2 - per admit History of substance abuse ( alcohol, cocaine and tobacco)   Daniel Splinter, MD 02/25/2021, 11:52 AM      Labs: Basic Metabolic Panel: Recent Labs  Lab 02/23/21 0948 02/24/21 0533 02/25/21 0051  NA 140 138  139  K 3.7 3.9 4.5  CL 96* 99 99  CO2 29 23 24   GLUCOSE 98 90 151*  BUN 44* 52* 67*  CREATININE 9.33* 10.75* 12.15*  CALCIUM 8.2* 8.3* 8.7*  PHOS  --   --  8.1*    Liver Function Tests: Recent Labs  Lab 02/23/21 0948 02/24/21 0533 02/25/21 0051  AST 79* 69*  --   ALT 24 21  --   ALKPHOS 69 67  --   BILITOT 0.9 0.9  --   PROT 7.5 6.6  --   ALBUMIN 2.6* 2.3* 2.3*    Recent Labs  Lab 02/23/21 0948  LIPASE 108*    No results for input(s): AMMONIA in the last 168 hours. CBC: Recent Labs  Lab 02/23/21 1002 02/23/21 1520 02/23/21 2204 02/24/21 0533 02/24/21 1855 02/25/21 0806  WBC 5.4 4.8 5.1 4.7  --  5.4  NEUTROABS 3.1  --   --   --   --   --   HGB 5.0* 4.8* 7.1* 6.8* 7.5* 8.1*  HCT 15.7* 14.9* 21.3* 20.2* 22.9* 24.4*  MCV 112.9* 112.9* 102.9* 102.0*  --  101.7*  PLT 137* 129* 114* 125*  --  137*    Cardiac Enzymes: No results for input(s): CKTOTAL, CKMB, CKMBINDEX, TROPONINI in  the last 168 hours. CBG: Recent Labs  Lab 02/24/21 1219 02/24/21 1601 02/24/21 2106 02/25/21 0609 02/25/21 1109  GLUCAP 92 134* 131* 109* 164*     Studies/Results: No results found. Medications:  sodium chloride Stopped (02/23/21 1219)   sodium chloride     [START ON 02/26/2021] sodium thiosulfate infusion for calciphylaxis      sodium chloride   Intravenous Once   amLODipine  10 mg Oral Daily   [START ON 02/26/2021] darbepoetin (ARANESP) injection - DIALYSIS  200 mcg Intravenous Q Tue-HD   doxepin  10 mg Oral QHS   feeding supplement (NEPRO CARB STEADY)  237 mL Oral BID BM   folic acid  1 mg Oral Daily   gabapentin  300 mg Oral QHS   hydrOXYzine  25 mg Oral BID   insulin aspart  0-6 Units Subcutaneous TID WC   lanthanum  1,000 mg Oral TID WC   loratadine  10 mg Oral Daily   multivitamin with minerals  1 tablet Oral Daily   nicotine  21 mg Transdermal Daily   pantoprazole (PROTONIX) IV  40 mg Intravenous Q12H   sodium chloride flush  3 mL Intravenous Q12H    sucralfate  1 g Oral Daily   thiamine  100 mg Oral Daily

## 2021-02-25 NOTE — Progress Notes (Signed)
PROGRESS NOTE    [name]  JJO:[id number] DOB: 1960-12-18 DOA: 02/23/2021 PCP: [name], NP  Brief Narrative:Daniel [name] is a 60/M w ESRD on HD TTS, alcohol and cocaine use, known history of peptic ulcer-most recent EGD on 10/14 and AVMs T2DM, anemia, hepatitis C who presented to ED after having episode of weakness, black tarry stools for few days. Denies active alcohol or NSAID use at this time  -In the ED hemoglobin was 4.8, he was transfused 2 units of PRBC and started on a Protonix drip   Assessment & Plan:   Upper GI bleed, small bowel AVMs Acute blood loss anemia, hemoglobin 4.8 on admission -EGD 10/30 noted gastritis, healed gastric ulcers, duodenal polyps, 3 AVMs in the duodenum 1 of which was actively bleeding, treated with APC -Transfused 3 units of PRBC -Continue PPI -Ambulate, out of bed to chair, PT eval -CBC in a.m.  ESRD on hemodialysis -Nephrology following, HD tomorrow  Transaminitis Chronic HCV -History of alcohol use -Trend LFTs  Type 2 diabetes mellitus -Diet controlled, HbA1c A1c was 5.4 in June  Essential hypertension -BP stable, continue Norvasc, clonidine on hold  History of alcohol, cocaine and tobacco abuse -Counseled  Calciphylaxis -Gets sodium thiosulfate with HD, per nephrology  L heel wound -[facility] consult appreciated,  DVT prophylaxis: SCDs Code Status: Full code Family Communication: Discussed patient in detail, no family at bedside Disposition Plan:  Inpatient appropriate due to severity of upper GI bleed, acute blood loss anemia    Consultants:  GI  Procedures: EGD 10/30  Antimicrobials:    Subjective: -Feels okay, itching all over, denies any other specific complaints  Objective: Vitals:   02/24/21 2306 02/25/21 0310 02/25/21 0733 02/25/21 1108  BP: 129/64 (!) 117/48 (!) 142/63 129/64  Pulse: 96 91  96  Resp: 19 13 19    Temp: 97.8 F (36.6 C) 97.9 F (36.6 C) 97.6 F (36.4 C) 97.9 F  (36.6 C)  TempSrc: Oral Oral    SpO2: 93% 92%  94%  Weight:      Height:        Intake/Output Summary (Last 24 hours) at 02/25/2021 1223 Last data filed at 02/25/2021 0915 Gross per 24 hour  Intake 356 ml  Output --  Net 356 ml   Filed Weights   02/23/21 0930 02/23/21 0936 02/24/21 1707  Weight: 104.3 kg 104.3 kg 129.3 kg    Examination: Gen: Chronically ill male sitting up in bed, AAOx3, no distress HEENT: No JVD CVS: S1-S2, regular rate rhythm Lungs: Decreased breath sounds the bases otherwise clear Abdomen: Soft, nontender, obese, bowel sounds present Extremities: LUE fistula/graft, L heel wound w/ dressing Skin: hyperpigmented lesions on legs, heel wound w/ dressing   Data Reviewed:   CBC: Recent Labs  Lab 02/23/21 1002 02/23/21 1520 02/23/21 2204 02/24/21 0533 02/24/21 1855 02/25/21 0806  WBC 5.4 4.8 5.1 4.7  --  5.4  NEUTROABS 3.1  --   --   --   --   --   HGB 5.0* 4.8* 7.1* 6.8* 7.5* 8.1*  HCT 15.7* 14.9* 21.3* 20.2* 22.9* 24.4*  MCV 112.9* 112.9* 102.9* 102.0*  --  101.7*  PLT 137* 129* 114* 125*  --  160*   Basic Metabolic Panel: Recent Labs  Lab 02/23/21 0948 02/23/21 1316 02/24/21 0533 02/25/21 0051  NA 140  --  138 139  K 3.7  --  3.9 4.5  CL 96*  --  99 99  CO2 29  --  23 24  GLUCOSE 98  --  90 151*  BUN 44*  --  52* 67*  CREATININE 9.33*  --  10.75* 12.15*  CALCIUM 8.2*  --  8.3* 8.7*  MG  --  2.1  --   --   PHOS  --   --   --  8.1*   GFR: Estimated Creatinine Clearance: 8.8 mL/min (A) (by C-G formula based on SCr of 12.15 mg/dL (H)). Liver Function Tests: Recent Labs  Lab 02/23/21 0948 02/24/21 0533 02/25/21 0051  AST 79* 69*  --   ALT 24 21  --   ALKPHOS 69 67  --   BILITOT 0.9 0.9  --   PROT 7.5 6.6  --   ALBUMIN 2.6* 2.3* 2.3*   Recent Labs  Lab 02/23/21 0948  LIPASE 108*   No results for input(s): AMMONIA in the last 168 hours. Coagulation Profile: No results for input(s): INR, PROTIME in the last 168  hours. Cardiac Enzymes: No results for input(s): CKTOTAL, CKMB, CKMBINDEX, TROPONINI in the last 168 hours. BNP (last 3 results) No results for input(s): PROBNP in the last 8760 hours. HbA1C: No results for input(s): HGBA1C in the last 72 hours. CBG: Recent Labs  Lab 02/24/21 1219 02/24/21 1601 02/24/21 2106 02/25/21 0609 02/25/21 1109  GLUCAP 92 134* 131* 109* 164*   Lipid Profile: No results for input(s): CHOL, HDL, LDLCALC, TRIG, CHOLHDL, LDLDIRECT in the last 72 hours. Thyroid Function Tests: No results for input(s): TSH, T4TOTAL, FREET4, T3FREE, THYROIDAB in the last 72 hours. Anemia Panel: No results for input(s): VITAMINB12, FOLATE, FERRITIN, TIBC, IRON, RETICCTPCT in the last 72 hours. Urine analysis:    Component Value Date/Time   COLORURINE YELLOW 07/31/2019 2128   APPEARANCEUR CLEAR 07/31/2019 2128   LABSPEC 1.009 07/31/2019 2128   PHURINE 5.0 07/31/2019 2128   GLUCOSEU NEGATIVE 07/31/2019 2128   HGBUR SMALL (A) 07/31/2019 2128   BILIRUBINUR NEGATIVE 07/31/2019 2128   KETONESUR NEGATIVE 07/31/2019 2128   PROTEINUR 100 (A) 07/31/2019 2128   UROBILINOGEN 0.2 12/31/2009 0746   NITRITE NEGATIVE 07/31/2019 2128   LEUKOCYTESUR NEGATIVE 07/31/2019 2128   Sepsis Labs: @LABRCNTIP (procalcitonin:4,lacticidven:4)  ) Recent Results (from the past 240 hour(s))  Resp Panel by RT-PCR (Flu A&B, Covid) Nasopharyngeal Swab     Status: None   Collection Time: 02/23/21  9:49 AM   Specimen: Nasopharyngeal Swab; Nasopharyngeal(NP) swabs in vial transport medium  Result Value Ref Range Status   SARS Coronavirus 2 by RT PCR NEGATIVE NEGATIVE Final    Comment: (NOTE) SARS-CoV-2 target nucleic acids are NOT DETECTED.  The SARS-CoV-2 RNA is generally detectable in upper respiratory specimens during the acute phase of infection. The lowest concentration of SARS-CoV-2 viral copies this assay can detect is 138 copies/mL. A negative result does not preclude SARS-Cov-2 infection and  should not be used as the sole basis for treatment or other patient management decisions. A negative result may occur with  improper specimen collection/handling, submission of specimen other than nasopharyngeal swab, presence of viral mutation(s) within the areas targeted by this assay, and inadequate number of viral copies(<138 copies/mL). A negative result must be combined with clinical observations, patient history, and epidemiological information. The expected result is Negative.  Fact Sheet for Patients:  EntrepreneurPulse.com.au  Fact Sheet for Healthcare Providers:  IncredibleEmployment.be  This test is no t yet approved or cleared by the [place] FDA and  has been authorized for detection and/or diagnosis of SARS-CoV-2 by FDA under an Emergency Use Authorization (  EUA). This EUA will remain  in effect (meaning this test can be used) for the duration of the COVID-19 declaration under Section 564(b)(1) of the Act, 21 U.S.C.section 360bbb-3(b)(1), unless the authorization is terminated  or revoked sooner.       Influenza A by PCR NEGATIVE NEGATIVE Final   Influenza B by PCR NEGATIVE NEGATIVE Final    Comment: (NOTE) The Xpert Xpress SARS-CoV-2/FLU/RSV plus assay is intended as an aid in the diagnosis of influenza from Nasopharyngeal swab specimens and should not be used as a sole basis for treatment. Nasal washings and aspirates are unacceptable for Xpert Xpress SARS-CoV-2/FLU/RSV testing.  Fact Sheet for Patients: EntrepreneurPulse.com.au  Fact Sheet for Healthcare Providers: IncredibleEmployment.be  This test is not yet approved or cleared by the [place] FDA and has been authorized for detection and/or diagnosis of SARS-CoV-2 by FDA under an Emergency Use Authorization (EUA). This EUA will remain in effect (meaning this test can be used) for the duration of the COVID-19 declaration  under Section 564(b)(1) of the Act, 21 U.S.C. section 360bbb-3(b)(1), unless the authorization is terminated or revoked.  Performed at [facility], [place] [address], [place], [place] [address]   Culture, blood (routine x 2)     Status: None (Preliminary result)   Collection Time: 02/23/21 10:02 AM   Specimen: BLOOD RIGHT HAND  Result Value Ref Range Status   Specimen Description BLOOD RIGHT HAND  Final   Special Requests   Final    BOTTLES DRAWN AEROBIC AND ANAEROBIC Blood Culture adequate volume   Culture   Final    NO GROWTH 2 DAYS Performed at [facility], [place] [address] [place], [place] [address]    Report Status PENDING  Incomplete  Culture, blood (routine x 2)     Status: None (Preliminary result)   Collection Time: 02/23/21 11:55 AM   Specimen: BLOOD  Result Value Ref Range Status   Specimen Description BLOOD RIGHT ANTECUBITAL  Final   Special Requests   Final    BOTTLES DRAWN AEROBIC AND ANAEROBIC Blood Culture results may not be optimal due to an inadequate volume of blood received in culture bottles   Culture   Final    NO GROWTH 2 DAYS Performed at [facility], [place] [address], [place], [place] [address]    Report Status PENDING  Incomplete  MRSA Next Gen by PCR, Nasal     Status: None   Collection Time: 02/24/21  5:48 PM   Specimen: Nasal Mucosa; Nasal Swab  Result Value Ref Range Status   MRSA by PCR Next Gen NOT DETECTED NOT DETECTED Final    Comment: (NOTE) The GeneXpert MRSA Assay (FDA approved for NASAL specimens only), is one component of a comprehensive MRSA colonization surveillance program. It is not intended to diagnose MRSA infection nor to guide or monitor treatment for MRSA infections. Test performance is not FDA approved in patients less than 102 years old. Performed at [facility], [place] [address], [place], [place] [address]          Radiology Studies: No results found.      Scheduled Meds:  sodium chloride    Intravenous Once   amLODipine  10 mg Oral Daily   [START ON 02/26/2021] Chlorhexidine Gluconate Cloth  6 each Topical Q0600   [START ON 02/26/2021] darbepoetin (ARANESP) injection - DIALYSIS  200 mcg Intravenous Q Tue-HD   doxepin  10 mg Oral QHS   feeding supplement (NEPRO  CARB STEADY)  237 mL Oral BID BM   folic acid  1 mg Oral Daily   gabapentin  300 mg Oral QHS   hydrocerin   Topical BID   hydrOXYzine  25 mg Oral BID   insulin aspart  0-6 Units Subcutaneous TID WC   lanthanum  1,000 mg Oral TID WC   loratadine  10 mg Oral Daily   multivitamin with minerals  1 tablet Oral Daily   nicotine  21 mg Transdermal Daily   pantoprazole (PROTONIX) IV  40 mg Intravenous Q12H   sodium chloride flush  3 mL Intravenous Q12H   sucralfate  1 g Oral Daily   thiamine  100 mg Oral Daily   Continuous Infusions:  sodium chloride Stopped (02/23/21 1219)   sodium chloride     [START ON 02/26/2021] sodium thiosulfate infusion for calciphylaxis       LOS: 2 days    Time spent: 12min  Domenic Polite, MD [facility]   02/25/2021, 12:23 PM

## 2021-02-25 NOTE — Telephone Encounter (Signed)
-----   Message from Sharyn Creamer, MD sent at 02/25/2021 10:16 AM EDT ----- Lesly Rubenstein,   Could you arrange for GI clinic follow up with Dr. Havery Moros or an APP in 1 month? Patient was admitted with bleed from an AVM but will likely be discharged today.  Thanks, Lyndee Leo

## 2021-02-25 NOTE — Progress Notes (Signed)
Brief GI Progress Note: SBE 10/30 with bleeding AVM treated with Epi and APC. Hb stable at 8.1 from 7.5. GI will sign off for now. We will arrange for GI clinic follow up with Dr. Havery Moros.

## 2021-02-25 NOTE — Telephone Encounter (Signed)
Patient has been scheduled for a 10-month follow up with Dr. Havery Moros on Wednesday, 03/27/21 at 1:20 pm. Letter mailed to patient with appt information.

## 2021-02-25 NOTE — Progress Notes (Signed)
Pt receives out-pt HD at [facility] on TTS. Pt arrives at 6:50 for 7:10 chair time. Will follow and assist.  Melven Sartorius Renal Navigator [phone]

## 2021-02-26 DIAGNOSIS — K921 Melena: Secondary | ICD-10-CM | POA: Diagnosis not present

## 2021-02-26 LAB — GLUCOSE, CAPILLARY
Glucose-Capillary: 119 mg/dL — ABNORMAL HIGH (ref 70–99)
Glucose-Capillary: 115 mg/dL — ABNORMAL HIGH (ref 70–99)
Glucose-Capillary: 123 mg/dL — ABNORMAL HIGH (ref 70–99)
Glucose-Capillary: 128 mg/dL — ABNORMAL HIGH (ref 70–99)

## 2021-02-26 LAB — HEPATITIS B SURFACE ANTIBODY,QUALITATIVE: Hep B S Ab: REACTIVE — AB

## 2021-02-26 LAB — CBC
HCT: 21.2 % — ABNORMAL LOW (ref 39.0–52.0)
Hemoglobin: 6.8 g/dL — CL (ref 13.0–17.0)
MCH: 33.5 pg (ref 26.0–34.0)
MCHC: 32.1 g/dL (ref 30.0–36.0)
MCV: 104.4 fL — ABNORMAL HIGH (ref 80.0–100.0)
Platelets: 130 10*3/uL — ABNORMAL LOW (ref 150–400)
RBC: 2.03 MIL/uL — ABNORMAL LOW (ref 4.22–5.81)
RDW: 22.2 % — ABNORMAL HIGH (ref 11.5–15.5)
WBC: 5.6 10*3/uL (ref 4.0–10.5)
nRBC: 1.8 % — ABNORMAL HIGH (ref 0.0–0.2)

## 2021-02-26 LAB — BASIC METABOLIC PANEL
Anion gap: 17 — ABNORMAL HIGH (ref 5–15)
BUN: 75 mg/dL — ABNORMAL HIGH (ref 8–23)
CO2: 23 mmol/L (ref 22–32)
Calcium: 8.8 mg/dL — ABNORMAL LOW (ref 8.9–10.3)
Chloride: 98 mmol/L (ref 98–111)
Creatinine, Ser: 13.9 mg/dL — ABNORMAL HIGH (ref 0.61–1.24)
GFR, Estimated: 4 mL/min — ABNORMAL LOW (ref >60)
Glucose, Bld: 139 mg/dL — ABNORMAL HIGH (ref 70–99)
Potassium: 4 mmol/L (ref 3.5–5.1)
Sodium: 138 mmol/L (ref 135–145)

## 2021-02-26 LAB — PREPARE RBC (CROSSMATCH)

## 2021-02-26 LAB — HEMOGLOBIN AND HEMATOCRIT, BLOOD
HCT: 24.9 % — ABNORMAL LOW (ref 39.0–52.0)
Hemoglobin: 8.4 g/dL — ABNORMAL LOW (ref 13.0–17.0)

## 2021-02-26 LAB — SURGICAL PATHOLOGY

## 2021-02-26 LAB — HEPATITIS B SURFACE ANTIGEN: Hepatitis B Surface Ag: NONREACTIVE

## 2021-02-26 MED ORDER — RENA-VITE PO TABS
1.0000 | ORAL_TABLET | Freq: Every day | ORAL | Status: DC
  Administered 2021-02-26 – 2021-02-27 (×2): 1 via ORAL
  Filled 2021-02-26 (×2): qty 1

## 2021-02-26 MED ORDER — PANTOPRAZOLE SODIUM 40 MG PO TBEC
40.0000 mg | DELAYED_RELEASE_TABLET | Freq: Two times a day (BID) | ORAL | Status: DC
  Administered 2021-02-26 – 2021-02-27 (×3): 40 mg via ORAL
  Filled 2021-02-26 (×3): qty 1

## 2021-02-26 MED ORDER — SODIUM CHLORIDE 0.9% IV SOLUTION: Freq: Once | INTRAVENOUS | Status: DC

## 2021-02-26 NOTE — Significant Event (Signed)
HGB down to 6.8 this AM.  1u PRBC transfusion ordered over 4h.  Also will send message to nephrology to let them know, may or may not need additional unit(s) during dialysis today.

## 2021-02-26 NOTE — Progress Notes (Signed)
Subjective: seen on HD, no c/o's   Objective Vital signs in last 24 hours: Vitals:   02/26/21 1030 02/26/21 1100 02/26/21 1126 02/26/21 1300  BP: (!) 124/43 (!) 141/58 106/76 (!) 140/31  Pulse: 77  92 98  Resp: 15 (!) 22 16 14   Temp:   (!) 97.1 F (36.2 C) (!) 97.4 F (36.3 C)  TempSrc:   Temporal Oral  SpO2: 99% 99% 99% 94%  Weight:      Height:       Weight change:   Physical Exam: General: Alert obese African-American male NAD Heart: RRR, no MRG Lungs: CTA nonlabored breathing Abdomen: Obese, NABS, nontender nondistended  Extremities: Bilateral pedal edema trace With scattered hyperpigmented macular rash on lower extremities w/ one left calf dark ulcerated lesion, also has a left heel wound with dressing on Dialysis Access: Positive bruit LUA aVF   OP HD: [facility] TTS  4h  131kg  2/2 bath  Hep none LUA AVF   - mircera 225 q2, last 10/04  - hect 1ug IV tiw  - Na thiosulfate 25 gm tiw IV      Problem/Plan: Symptomatic anemia/ recurrent GI bleed- Hgb 5.0 on admit, sp prbc's. SP EGD. No heparin w/ dialysis.  ESRD -HD TTS. Next HD today. Hypertension/volume - at dry wt, no vol excess on exam or CXR.  Anemia of ESRD and GI bleed- transfusion as per #1, on max ESA as outpatient ,next dose esa due w/ HD Tuesday darbe 200ug weekly MBD ckd / hx calciphylaxis - of LLE, cont Na thiosulfate. Phoslo dc'd as binder d/t Ca++ load, also dc'd hectorol. Fosrenol started as non Ca binder.  Nutrition -albumin 2.3 use protein supplements when taking PTO's Left heel wound-  per admit wound care to see Transaminitis--  history of chronic hep C /history of alcohol use . Diabetes mellitus type 2 - per admit History of substance abuse ( alcohol, cocaine and tobacco)   Kelly Splinter, MD 02/26/2021, 3:01 PM      Labs: Basic Metabolic Panel: Recent Labs  Lab 02/24/21 0533 02/25/21 0051 02/26/21 0052  NA 138 139 138  K 3.9 4.5 4.0  CL 99 99 98  CO2 23 24 23   GLUCOSE 90 151* 139*   BUN 52* 67* 75*  CREATININE 10.75* 12.15* 13.90*  CALCIUM 8.3* 8.7* 8.8*  PHOS  --  8.1*  --     Liver Function Tests: Recent Labs  Lab 02/23/21 0948 02/24/21 0533 02/25/21 0051  AST 79* 69*  --   ALT 24 21  --   ALKPHOS 69 67  --   BILITOT 0.9 0.9  --   PROT 7.5 6.6  --   ALBUMIN 2.6* 2.3* 2.3*    Recent Labs  Lab 02/23/21 0948  LIPASE 108*    No results for input(s): AMMONIA in the last 168 hours. CBC: Recent Labs  Lab 02/23/21 1002 02/23/21 1520 02/23/21 2204 02/24/21 0533 02/24/21 1855 02/25/21 0806 02/26/21 0052  WBC 5.4 4.8 5.1 4.7  --  5.4 5.6  NEUTROABS 3.1  --   --   --   --   --   --   HGB 5.0* 4.8* 7.1* 6.8* 7.5* 8.1* 6.8*  HCT 15.7* 14.9* 21.3* 20.2* 22.9* 24.4* 21.2*  MCV 112.9* 112.9* 102.9* 102.0*  --  101.7* 104.4*  PLT 137* 129* 114* 125*  --  137* 130*    Cardiac Enzymes: No results for input(s): CKTOTAL, CKMB, CKMBINDEX, TROPONINI in the last 168 hours.  CBG: Recent Labs  Lab 02/25/21 1109 02/25/21 1625 02/25/21 2135 02/26/21 0613 02/26/21 1230  GLUCAP 164* 139* 96 119* 115*     Studies/Results: No results found. Medications:  sodium chloride     sodium thiosulfate infusion for calciphylaxis      sodium chloride   Intravenous Once   amLODipine  10 mg Oral Daily   Chlorhexidine Gluconate Cloth  6 each Topical Q0600   darbepoetin (ARANESP) injection - DIALYSIS  200 mcg Intravenous Q Tue-HD   doxepin  10 mg Oral QHS   feeding supplement (NEPRO CARB STEADY)  237 mL Oral BID BM   folic acid  1 mg Oral Daily   gabapentin  300 mg Oral QHS   hydrocerin   Topical BID   hydrOXYzine  25 mg Oral BID   insulin aspart  0-6 Units Subcutaneous TID WC   lanthanum  1,000 mg Oral TID WC   loratadine  10 mg Oral Daily   multivitamin  1 tablet Oral QHS   nicotine  21 mg Transdermal Daily   pantoprazole  40 mg Oral BID   sodium chloride flush  3 mL Intravenous Q12H   sucralfate  1 g Oral Daily   thiamine  100 mg Oral Daily

## 2021-02-26 NOTE — Care Management Important Message (Signed)
Important Message  Patient Details  Name: Daniel [name] MRN: [id number] Date of Birth: Jun 26, 1960   Medicare Important Message Given:        [name] 02/26/2021, 12:50 PM

## 2021-02-26 NOTE — Progress Notes (Signed)
PROGRESS NOTE    Daniel [name]  ZRA:[id number] DOB: January 11, 1961 DOA: 02/23/2021 PCP: [name], NP  Brief Narrative:Daniel [name] is a 60/M w ESRD on HD TTS, alcohol and cocaine use, known history of peptic ulcer-most recent EGD on 10/14 and AVMs T2DM, anemia, hepatitis C who presented to ED after having episode of weakness, black tarry stools for few days. Denies active alcohol or NSAID use at this time  -In the ED hemoglobin was 4.8, he was transfused 2 units of PRBC and started on a Protonix drip   Assessment & Plan:   Upper GI bleed, small bowel AVMs Acute blood loss anemia, hemoglobin 4.8 on admission -EGD 10/30 noted gastritis, healed gastric ulcers, duodenal polyps, 3 AVMs in the duodenum 1 of which was actively bleeding, treated with APC -Transfused 3 units of PRBC this admission -Hemoglobin down to 6.8 today, suspect this is still equilibrating, transfuse 1 unit of PRBC today, does not appear to be actively bleeding anymore -Gastroenterology has signed off, home tomorrow if Hb is stable  ESRD on hemodialysis -Nephrology following, dialysis today  Type 2 diabetes mellitus -Diet controlled, HbA1c A1c was 5.4 in June -CBG stable  Essential hypertension -BP stable, continue Norvasc, clonidine on hold  History of alcohol use Chronic HCV -Counseled  History of cocaine and tobacco abuse -Counseled  Calciphylaxis -Gets sodium thiosulfate with HD, per nephrology  L heel wound, [facility] consult appreciated, continue daily dressing  DVT prophylaxis: SCDs Code Status: Full code Family Communication: Discussed patient in detail, no family at bedside Disposition Plan:  Inpatient appropriate due to severity of upper GI bleed, acute blood loss anemia    Consultants:  GI  Procedures: EGD 10/30  Antimicrobials:    Subjective: -Feels okay, denies black stools yesterday or overnight  Objective: Vitals:   02/26/21 1030 02/26/21 1100  02/26/21 1126 02/26/21 1300  BP: (!) 124/43 (!) 141/58 106/76 (!) 140/31  Pulse: 77  92 98  Resp: 15 (!) 22 16 14   Temp:   (!) 97.1 F (36.2 C) (!) 97.4 F (36.3 C)  TempSrc:   Temporal Oral  SpO2: 99% 99% 99% 94%  Weight:      Height:        Intake/Output Summary (Last 24 hours) at 02/26/2021 1431 Last data filed at 02/26/2021 1300 Gross per 24 hour  Intake 1591.67 ml  Output 2043 ml  Net -451.33 ml   Filed Weights   02/23/21 0936 02/24/21 1707 02/26/21 0811  Weight: 104.3 kg 129.3 kg 132 kg    Examination: General: Chronically ill male sitting up in bed, seen on dialysis, AAOx3, no distress HEENT: No JVD CVS: S1-S2, regular rate rhythm Lungs: Decreased breath sounds the bases, few basilar rales Abdomen: Soft, obese, nontender, bowel sounds present  Extremities: LUE fistula/graft, L heel wound w/ dressing Skin: hyperpigmented lesions on legs, heel wound w/ dressing   Data Reviewed:   CBC: Recent Labs  Lab 02/23/21 1002 02/23/21 1520 02/23/21 2204 02/24/21 0533 02/24/21 1855 02/25/21 0806 02/26/21 0052  WBC 5.4 4.8 5.1 4.7  --  5.4 5.6  NEUTROABS 3.1  --   --   --   --   --   --   HGB 5.0* 4.8* 7.1* 6.8* 7.5* 8.1* 6.8*  HCT 15.7* 14.9* 21.3* 20.2* 22.9* 24.4* 21.2*  MCV 112.9* 112.9* 102.9* 102.0*  --  101.7* 104.4*  PLT 137* 129* 114* 125*  --  137* 545*   Basic Metabolic Panel: Recent Labs  Lab  02/23/21 0948 02/23/21 1316 02/24/21 0533 02/25/21 0051 02/26/21 0052  NA 140  --  138 139 138  K 3.7  --  3.9 4.5 4.0  CL 96*  --  99 99 98  CO2 29  --  23 24 23   GLUCOSE 98  --  90 151* 139*  BUN 44*  --  52* 67* 75*  CREATININE 9.33*  --  10.75* 12.15* 13.90*  CALCIUM 8.2*  --  8.3* 8.7* 8.8*  MG  --  2.1  --   --   --   PHOS  --   --   --  8.1*  --    GFR: Estimated Creatinine Clearance: 7.7 mL/min (A) (by C-G formula based on SCr of 13.9 mg/dL (H)). Liver Function Tests: Recent Labs  Lab 02/23/21 0948 02/24/21 0533 02/25/21 0051  AST 79* 69*   --   ALT 24 21  --   ALKPHOS 69 67  --   BILITOT 0.9 0.9  --   PROT 7.5 6.6  --   ALBUMIN 2.6* 2.3* 2.3*   Recent Labs  Lab 02/23/21 0948  LIPASE 108*   No results for input(s): AMMONIA in the last 168 hours. Coagulation Profile: No results for input(s): INR, PROTIME in the last 168 hours. Cardiac Enzymes: No results for input(s): CKTOTAL, CKMB, CKMBINDEX, TROPONINI in the last 168 hours. BNP (last 3 results) No results for input(s): PROBNP in the last 8760 hours. HbA1C: No results for input(s): HGBA1C in the last 72 hours. CBG: Recent Labs  Lab 02/25/21 1109 02/25/21 1625 02/25/21 2135 02/26/21 0613 02/26/21 1230  GLUCAP 164* 139* 96 119* 115*   Lipid Profile: No results for input(s): CHOL, HDL, LDLCALC, TRIG, CHOLHDL, LDLDIRECT in the last 72 hours. Thyroid Function Tests: No results for input(s): TSH, T4TOTAL, FREET4, T3FREE, THYROIDAB in the last 72 hours. Anemia Panel: No results for input(s): VITAMINB12, FOLATE, FERRITIN, TIBC, IRON, RETICCTPCT in the last 72 hours. Urine analysis:    Component Value Date/Time   COLORURINE YELLOW 07/31/2019 2128   APPEARANCEUR CLEAR 07/31/2019 2128   LABSPEC 1.009 07/31/2019 2128   PHURINE 5.0 07/31/2019 2128   GLUCOSEU NEGATIVE 07/31/2019 2128   HGBUR SMALL (A) 07/31/2019 2128   BILIRUBINUR NEGATIVE 07/31/2019 2128   KETONESUR NEGATIVE 07/31/2019 2128   PROTEINUR 100 (A) 07/31/2019 2128   UROBILINOGEN 0.2 12/31/2009 0746   NITRITE NEGATIVE 07/31/2019 2128   LEUKOCYTESUR NEGATIVE 07/31/2019 2128   Sepsis Labs: @LABRCNTIP (procalcitonin:4,lacticidven:4)  ) Recent Results (from the past 240 hour(s))  Resp Panel by RT-PCR (Flu A&B, Covid) Nasopharyngeal Swab     Status: None   Collection Time: 02/23/21  9:49 AM   Specimen: Nasopharyngeal Swab; Nasopharyngeal(NP) swabs in vial transport medium  Result Value Ref Range Status   SARS Coronavirus 2 by RT PCR NEGATIVE NEGATIVE Final    Comment: (NOTE) SARS-CoV-2 target  nucleic acids are NOT DETECTED.  The SARS-CoV-2 RNA is generally detectable in upper respiratory specimens during the acute phase of infection. The lowest concentration of SARS-CoV-2 viral copies this assay can detect is 138 copies/mL. A negative result does not preclude SARS-Cov-2 infection and should not be used as the sole basis for treatment or other patient management decisions. A negative result may occur with  improper specimen collection/handling, submission of specimen other than nasopharyngeal swab, presence of viral mutation(s) within the areas targeted by this assay, and inadequate number of viral copies(<138 copies/mL). A negative result must be combined with clinical observations, patient history, and epidemiological  information. The expected result is Negative.  Fact Sheet for Patients:  EntrepreneurPulse.com.au  Fact Sheet for Healthcare Providers:  IncredibleEmployment.be  This test is no t yet approved or cleared by the [place] FDA and  has been authorized for detection and/or diagnosis of SARS-CoV-2 by FDA under an Emergency Use Authorization (EUA). This EUA will remain  in effect (meaning this test can be used) for the duration of the COVID-19 declaration under Section 564(b)(1) of the Act, 21 U.S.C.section 360bbb-3(b)(1), unless the authorization is terminated  or revoked sooner.       Influenza A by PCR NEGATIVE NEGATIVE Final   Influenza B by PCR NEGATIVE NEGATIVE Final    Comment: (NOTE) The Xpert Xpress SARS-CoV-2/FLU/RSV plus assay is intended as an aid in the diagnosis of influenza from Nasopharyngeal swab specimens and should not be used as a sole basis for treatment. Nasal washings and aspirates are unacceptable for Xpert Xpress SARS-CoV-2/FLU/RSV testing.  Fact Sheet for Patients: EntrepreneurPulse.com.au  Fact Sheet for Healthcare  Providers: IncredibleEmployment.be  This test is not yet approved or cleared by the [place] FDA and has been authorized for detection and/or diagnosis of SARS-CoV-2 by FDA under an Emergency Use Authorization (EUA). This EUA will remain in effect (meaning this test can be used) for the duration of the COVID-19 declaration under Section 564(b)(1) of the Act, 21 U.S.C. section 360bbb-3(b)(1), unless the authorization is terminated or revoked.  Performed at [facility], [place] [address], [place], [place] [address]   Culture, blood (routine x 2)     Status: None (Preliminary result)   Collection Time: 02/23/21 10:02 AM   Specimen: BLOOD RIGHT HAND  Result Value Ref Range Status   Specimen Description BLOOD RIGHT HAND  Final   Special Requests   Final    BOTTLES DRAWN AEROBIC AND ANAEROBIC Blood Culture adequate volume   Culture   Final    NO GROWTH 3 DAYS Performed at [facility], [place] [address] [place], [place] [address]    Report Status PENDING  Incomplete  Culture, blood (routine x 2)     Status: None (Preliminary result)   Collection Time: 02/23/21 11:55 AM   Specimen: BLOOD  Result Value Ref Range Status   Specimen Description BLOOD RIGHT ANTECUBITAL  Final   Special Requests   Final    BOTTLES DRAWN AEROBIC AND ANAEROBIC Blood Culture results may not be optimal due to an inadequate volume of blood received in culture bottles   Culture   Final    NO GROWTH 3 DAYS Performed at [facility], [place] [address] [place], [place] [address]    Report Status PENDING  Incomplete  MRSA Next Gen by PCR, Nasal     Status: None   Collection Time: 02/24/21  5:48 PM   Specimen: Nasal Mucosa; Nasal Swab  Result Value Ref Range Status   MRSA by PCR Next Gen NOT DETECTED NOT DETECTED Final    Comment: (NOTE) The GeneXpert MRSA Assay (FDA approved for NASAL specimens only), is one component of a comprehensive MRSA colonization surveillance program.  It is not intended to diagnose MRSA infection nor to guide or monitor treatment for MRSA infections. Test performance is not FDA approved in patients less than 29 years old. Performed at [facility], [place] [address], [place],  [address]     Scheduled Meds:  sodium chloride   Intravenous Once   amLODipine  10 mg Oral Daily   Chlorhexidine  Gluconate Cloth  6 each Topical Q0600   darbepoetin (ARANESP) injection - DIALYSIS  200 mcg Intravenous Q Tue-HD   doxepin  10 mg Oral QHS   feeding supplement (NEPRO CARB STEADY)  237 mL Oral BID BM   folic acid  1 mg Oral Daily   gabapentin  300 mg Oral QHS   hydrocerin   Topical BID   hydrOXYzine  25 mg Oral BID   insulin aspart  0-6 Units Subcutaneous TID WC   lanthanum  1,000 mg Oral TID WC   loratadine  10 mg Oral Daily   multivitamin  1 tablet Oral QHS   nicotine  21 mg Transdermal Daily   pantoprazole (PROTONIX) IV  40 mg Intravenous Q12H   sodium chloride flush  3 mL Intravenous Q12H   sucralfate  1 g Oral Daily   thiamine  100 mg Oral Daily   Continuous Infusions:  sodium chloride     sodium thiosulfate infusion for calciphylaxis       LOS: 3 days    Time spent: 44min  Domenic Polite, MD [facility]   02/26/2021, 2:31 PM

## 2021-02-26 NOTE — Progress Notes (Signed)
PT Cancellation Note  Patient Details Name: Daniel [name] MRN: [id number] DOB: Mar 11, 1961   Cancelled Treatment:    Reason Eval/Treat Not Completed: Patient at procedure or test/unavailable (HD). Will follow up for PT treatment as schedule permits.  Brandon Melnick, SPT  Brandon Melnick 02/26/2021, 8:31 AM

## 2021-02-26 NOTE — Progress Notes (Signed)
Physical Therapy Treatment Patient Details Name: Daniel [name] MRN: [id number] DOB: January 21, 1961 Today's Date: 02/26/2021   History of Present Illness Pt is a 60 y.o. male admitted 02/23/21 with weakness and x1 week of dark/black stools. Upon work up, Hgb 5.0 and pt given 2 units PRBC. EGD 10/30 noted gastritis, healed gastric ulcers, duodenal polyps, bleeding arteriovenous malformation (AVM) treated with APC. PMH includes anemia, DM II, ESRD (HD TTS(, HTN, ICH, alcohol and cocaine use.   PT Comments    Pt progressing with mobility. Today's session focused on ambulation for improving strength and activity tolerance; pt moving well with supervision for safety, limited by fatigue; pt also expresses concerns about continuing to have black, tarry stools this afternoon (RN notified). Pt declines any DME use while admitted or for discharge. Will continue to follow acutely to address established goals.    Recommendations for follow up therapy are one component of a multi-disciplinary discharge planning process, led by the attending physician.  Recommendations may be updated based on patient status, additional functional criteria and insurance authorization.  Follow Up Recommendations  No PT follow up     Assistance Recommended at Discharge PRN  Equipment Recommendations  None recommended by PT    Recommendations for Other Services       Precautions / Restrictions Precautions Precautions: Fall Restrictions Weight Bearing Restrictions: No     Mobility  Bed Mobility Overal bed mobility: Modified Independent                  Transfers Overall transfer level: Independent Equipment used: None               General transfer comment: indep to stand from EOB and low toilet height, reliant on momentum with wide BOS    Ambulation/Gait Ambulation/Gait assistance: Supervision Gait Distance (Feet): 104 Feet Assistive device: None Gait Pattern/deviations: Step-through  pattern;Decreased stride length;Wide base of support Gait velocity: Decreased   General Gait Details: Pt declined use of rollator, reports, "I want my legs to get stronger without that" - slow, steady gait without DME, supervision for safety/lines; reports distance limited by fatigue and back discomfort   Stairs             Wheelchair Mobility    Modified Rankin (Stroke Patients Only)       Balance Overall balance assessment: Mild deficits observed, not formally tested   Sitting balance-Leahy Scale: Good       Standing balance-Leahy Scale: Good               High level balance activites: Side stepping;Direction changes;Turns;Sudden stops;Head turns High Level Balance Comments: No overt instability or LOB with observed higher level balance tasks            Cognition Arousal/Alertness: Awake/alert Behavior During Therapy: WFL for tasks assessed/performed Overall Cognitive Status: Within Functional Limits for tasks assessed                                          Exercises      General Comments General comments (skin integrity, edema, etc.): Educ on use of rollator for stability and energy conservation (especially with community distances) since pt limited with ambulation distance at baseline; pt reports not interested in using DME      Pertinent Vitals/Pain Pain Assessment: Faces Faces Pain Scale: Hurts little more Pain Location: back Pain Descriptors / Indicators: Discomfort  Pain Intervention(s): Monitored during session;Limited activity within patient's tolerance    Home Living                          Prior Function            PT Goals (current goals can now be found in the care plan section) Progress towards PT goals: Progressing toward goals    Frequency    Min 3X/week      PT Plan Equipment recommendations need to be updated;Frequency needs to be updated    Co-evaluation              AM-PAC PT  "6 Clicks" Mobility   Outcome Measure  Help needed turning from your back to your side while in a flat bed without using bedrails?: None Help needed moving from lying on your back to sitting on the side of a flat bed without using bedrails?: None Help needed moving to and from a bed to a chair (including a wheelchair)?: None Help needed standing up from a chair using your arms (e.g., wheelchair or bedside chair)?: None Help needed to walk in hospital room?: A Little Help needed climbing 3-5 steps with a railing? : A Little 6 Click Score: 22    End of Session   Activity Tolerance: Patient tolerated treatment well Patient left: in bed;with call bell/phone within reach Nurse Communication: Mobility status PT Visit Diagnosis: Muscle weakness (generalized) (M62.81)     Time: 3736-6815 PT Time Calculation (min) (ACUTE ONLY): 22 min  Charges:  $Therapeutic Exercise: 8-22 mins                     Mabeline Caras, PT, DPT Acute Rehabilitation Services  Pager [phone] Office [phone]  Derry Lory 02/26/2021, 3:35 PM

## 2021-02-26 NOTE — Progress Notes (Signed)
Date and time results received: 02/26/21 0158    Test: Hemoglobin Critical Value: 6.8  Name of Provider Notified: Sheran Luz, MD  Orders Received? Or Actions Taken?: Orders Received - See Orders for details

## 2021-02-27 DIAGNOSIS — E1169 Type 2 diabetes mellitus with other specified complication: Secondary | ICD-10-CM | POA: Diagnosis not present

## 2021-02-27 DIAGNOSIS — K921 Melena: Secondary | ICD-10-CM | POA: Diagnosis not present

## 2021-02-27 DIAGNOSIS — D62 Acute posthemorrhagic anemia: Secondary | ICD-10-CM | POA: Diagnosis not present

## 2021-02-27 DIAGNOSIS — D631 Anemia in chronic kidney disease: Secondary | ICD-10-CM

## 2021-02-27 DIAGNOSIS — N186 End stage renal disease: Secondary | ICD-10-CM

## 2021-02-27 DIAGNOSIS — D649 Anemia, unspecified: Secondary | ICD-10-CM

## 2021-02-27 DIAGNOSIS — Z992 Dependence on renal dialysis: Secondary | ICD-10-CM

## 2021-02-27 DIAGNOSIS — I10 Essential (primary) hypertension: Secondary | ICD-10-CM

## 2021-02-27 LAB — BASIC METABOLIC PANEL
Anion gap: 13 (ref 5–15)
BUN: 42 mg/dL — ABNORMAL HIGH (ref 8–23)
CO2: 25 mmol/L (ref 22–32)
Calcium: 8.5 mg/dL — ABNORMAL LOW (ref 8.9–10.3)
Chloride: 99 mmol/L (ref 98–111)
Creatinine, Ser: 9.58 mg/dL — ABNORMAL HIGH (ref 0.61–1.24)
GFR, Estimated: 6 mL/min — ABNORMAL LOW (ref >60)
Glucose, Bld: 133 mg/dL — ABNORMAL HIGH (ref 70–99)
Potassium: 3.8 mmol/L (ref 3.5–5.1)
Sodium: 137 mmol/L (ref 135–145)

## 2021-02-27 LAB — BPAM RBC
Blood Product Expiration Date: 202211142359
Blood Product Expiration Date: 202211142359
Blood Product Expiration Date: 202211152359
Blood Product Expiration Date: 202211162359
ISSUE DATE / TIME: 202210291543
ISSUE DATE / TIME: 202210291850
ISSUE DATE / TIME: 202210301243
ISSUE DATE / TIME: 202211010227
Unit Type and Rh: 7300
Unit Type and Rh: 7300
Unit Type and Rh: 7300
Unit Type and Rh: 7300

## 2021-02-27 LAB — CBC
HCT: 22.7 % — ABNORMAL LOW (ref 39.0–52.0)
Hemoglobin: 7.7 g/dL — ABNORMAL LOW (ref 13.0–17.0)
MCH: 33 pg (ref 26.0–34.0)
MCHC: 33.9 g/dL (ref 30.0–36.0)
MCV: 97.4 fL (ref 80.0–100.0)
Platelets: 123 10*3/uL — ABNORMAL LOW (ref 150–400)
RBC: 2.33 MIL/uL — ABNORMAL LOW (ref 4.22–5.81)
RDW: 22.7 % — ABNORMAL HIGH (ref 11.5–15.5)
WBC: 6.7 10*3/uL (ref 4.0–10.5)
nRBC: 1.5 % — ABNORMAL HIGH (ref 0.0–0.2)

## 2021-02-27 LAB — TYPE AND SCREEN
ABO/RH(D): B POS
Antibody Screen: NEGATIVE
Unit division: 0
Unit division: 0
Unit division: 0
Unit division: 0

## 2021-02-27 LAB — GLUCOSE, CAPILLARY
Glucose-Capillary: 98 mg/dL (ref 70–99)
Glucose-Capillary: 120 mg/dL — ABNORMAL HIGH (ref 70–99)
Glucose-Capillary: 119 mg/dL — ABNORMAL HIGH (ref 70–99)
Glucose-Capillary: 111 mg/dL — ABNORMAL HIGH (ref 70–99)

## 2021-02-27 LAB — HEPATITIS B SURFACE ANTIBODY, QUANTITATIVE: Hep B S AB Quant (Post): 149.3 m[IU]/mL (ref >9.9)

## 2021-02-27 NOTE — Progress Notes (Signed)
Subjective: seen on HD, no c/o's   Objective Vital signs in last 24 hours: Vitals:   02/26/21 2356 02/27/21 0220 02/27/21 0422 02/27/21 0723  BP: 123/74 (!) 145/84  (!) 120/55  Pulse: 93 98  100  Resp: 19 15  17   Temp: 98.3 F (36.8 C) 98.3 F (36.8 C)  98 F (36.7 C)  TempSrc: Oral Oral  Oral  SpO2: 96% 98%  94%  Weight:   129.7 kg   Height:       Weight change:   Physical Exam: General: Alert obese African-American male NAD Heart: RRR, no MRG Lungs: CTA nonlabored breathing Abdomen: Obese, NABS, nontender nondistended  Extremities: Bilateral pedal edema trace With scattered hyperpigmented macular rash on lower extremities w/ one left calf dark ulcerated lesion, also has a left heel wound with dressing on Dialysis Access: Positive bruit LUA aVF   OP HD: [facility] TTS  4h  131kg  2/2 bath  Hep none LUA AVF   - mircera 225 q2, last 10/04  - hect 1ug IV tiw  - Na thiosulfate 25 gm tiw IV      Problem/Plan: Symptomatic anemia/ recurrent GI bleed- Hgb 5.0 on admit. 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 SP 7u prbc's.  ESRD -HD TTS. HD tomorrow. No heparin w/ HD.  Hypertension/volume - at dry wt, no vol excess on exam or CXR.  Anemia of ESRD and GI bleed- transfusion as per #1, on max ESA as outpatient ,next dose esa due w/ HD Tuesday darbe 200ug weekly MBD ckd / hx calciphylaxis - of LLE, cont Na thiosulfate. Phoslo dc'd as binder d/t Ca++ load, also dc'd hectorol. Fosrenol started as non Ca binder.  Nutrition -albumin 2.3 use protein supplements when taking PTO's Left heel wound-  per admit wound care to see Transaminitis--  history of chronic hep C /history of alcohol use . Diabetes mellitus type 2 - per admit History of substance abuse ( alcohol, cocaine and tobacco)   Kelly Splinter, MD 02/27/2021, 11:19 AM      Labs: Basic Metabolic Panel: Recent Labs  Lab 02/25/21 0051 02/26/21 0052 02/27/21 0111  NA 139 138 137  K 4.5 4.0 3.8  CL 99 98 99  CO2 24 23 25    GLUCOSE 151* 139* 133*  BUN 67* 75* 42*  CREATININE 12.15* 13.90* 9.58*  CALCIUM 8.7* 8.8* 8.5*  PHOS 8.1*  --   --     Liver Function Tests: Recent Labs  Lab 02/23/21 0948 02/24/21 0533 02/25/21 0051  AST 79* 69*  --   ALT 24 21  --   ALKPHOS 69 67  --   BILITOT 0.9 0.9  --   PROT 7.5 6.6  --   ALBUMIN 2.6* 2.3* 2.3*    Recent Labs  Lab 02/23/21 0948  LIPASE 108*    No results for input(s): AMMONIA in the last 168 hours. CBC: Recent Labs  Lab 02/23/21 1002 02/23/21 1520 02/23/21 2204 02/24/21 0533 02/24/21 1855 02/25/21 0806 02/26/21 0052 02/26/21 1525 02/27/21 0111  WBC 5.4   < > 5.1 4.7  --  5.4 5.6  --  6.7  NEUTROABS 3.1  --   --   --   --   --   --   --   --   HGB 5.0*   < > 7.1* 6.8*   < > 8.1* 6.8* 8.4* 7.7*  HCT 15.7*   < > 21.3* 20.2*   < > 24.4* 21.2* 24.9* 22.7*  MCV 112.9*   < > 102.9* 102.0*  --  101.7* 104.4*  --  97.4  PLT 137*   < > 114* 125*  --  137* 130*  --  123*   < > = values in this interval not displayed.    Cardiac Enzymes: No results for input(s): CKTOTAL, CKMB, CKMBINDEX, TROPONINI in the last 168 hours. CBG: Recent Labs  Lab 02/26/21 0613 02/26/21 1230 02/26/21 1625 02/26/21 2105 02/27/21 0619  GLUCAP 119* 115* 123* 128* 98     Studies/Results: No results found. Medications:  sodium chloride     sodium thiosulfate infusion for calciphylaxis      sodium chloride   Intravenous Once   amLODipine  10 mg Oral Daily   Chlorhexidine Gluconate Cloth  6 each Topical Q0600   darbepoetin (ARANESP) injection - DIALYSIS  200 mcg Intravenous Q Tue-HD   doxepin  10 mg Oral QHS   feeding supplement (NEPRO CARB STEADY)  237 mL Oral BID BM   folic acid  1 mg Oral Daily   gabapentin  300 mg Oral QHS   hydrocerin   Topical BID   hydrOXYzine  25 mg Oral BID   insulin aspart  0-6 Units Subcutaneous TID WC   lanthanum  1,000 mg Oral TID WC   loratadine  10 mg Oral Daily   multivitamin  1 tablet Oral QHS   nicotine  21 mg  Transdermal Daily   pantoprazole  40 mg Oral BID   sodium chloride flush  3 mL Intravenous Q12H   sucralfate  1 g Oral Daily   thiamine  100 mg Oral Daily

## 2021-02-27 NOTE — Progress Notes (Signed)
PT Cancellation Note  Patient Details Name: MONICA ZAHLER MRN: [id number] DOB: 05-27-1960   Cancelled Treatment:    Reason Eval/Treat Not Completed: (P) Patient declined, no reason specified Pt reports stomach pain with eating breakfast. RN notified. Pt request follow back later.   Jacoya Bauman B. Migdalia Dk PT, DPT Acute Rehabilitation Services Pager [phone] Office [phone]   [place] 02/27/2021, 10:17 AM

## 2021-02-27 NOTE — TOC Initial Note (Signed)
Transition of Care [facility]) - Initial/Assessment Note    Patient Details  Name: Daniel [name] MRN: [id number] Date of Birth: 1961/04/16  Transition of Care [facility]) CM/SW Contact:    Zenon Mayo, RN Phone Number: 02/27/2021, 12:57 PM  Clinical Narrative:                 Patient from home, admitted with GIB , TOC will continue to follow for dc needs.  Expected Discharge Plan: Home/Self Care Barriers to Discharge: Continued Medical Work up   Patient Goals and CMS Choice        Expected Discharge Plan and Services Expected Discharge Plan: Home/Self Care   Discharge Planning Services: CM Consult   Living arrangements for the past 2 months: Single Family Home                   DME Agency: NA       HH Arranged: NA          Prior Living Arrangements/Services Living arrangements for the past 2 months: Single Family Home            Need for Family Participation in Patient Care: Yes (Comment)     Criminal Activity/Legal Involvement Pertinent to Current Situation/Hospitalization: No - Comment as needed  Activities of Daily Living      Permission Sought/Granted                  Emotional Assessment       Orientation: : Oriented to Self, Oriented to Place, Oriented to  Time, Oriented to Situation Alcohol / Substance Use: Tobacco Use    Admission diagnosis:  GI bleed [K92.2] Gastrointestinal hemorrhage with melena [K92.1] Gastrointestinal hemorrhage, unspecified gastrointestinal hemorrhage type [K92.2] Patient Active Problem List   Diagnosis Date Noted   Acute blood loss anemia    Benign neoplasm of duodenum, jejunum, and ileum    Anemia due to chronic kidney disease 02/23/2021   Prolonged QT interval 02/23/2021   Rotator cuff tear arthropathy of right shoulder 01/23/2021   Alcohol dependence ([place]) 01/13/2021   Gastric ulcer with hemorrhage 01/13/2021   Generalized weakness    Transaminitis 01/10/2021   Seizure-like activity ([place])  10/27/2020   History of alcohol abuse 10/27/2020   History of cocaine abuse ([place]) 10/27/2020   Nicotine dependence, cigarettes, uncomplicated 93/81/0175   Acute GI bleeding 08/10/2020   Gastrointestinal hemorrhage with melena    Duodenal ulcer with hemorrhage    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   ESRD on hemodialysis ([place])    AVM (arteriovenous malformation) of small bowel, acquired with hemorrhage    Symptomatic anemia 07/31/2019   Acute on chronic anemia 07/23/2019   Chronic hepatitis C without hepatic coma ([place]) 07/11/2019   Iron deficiency anemia 03/30/2019   Alcohol use disorder 03/30/2019   B12 deficiency 03/30/2019   Orthostatic hypotension 01/22/2019   Essential hypertension 06/29/2016   DM2 (diabetes mellitus, type 2) ([place]) 06/29/2016   PCP:  [name], NP Pharmacy:   [facility] [address] [place] [address] Phone: [phone] Fax: [phone]     Social Determinants of Health (SDOH) Interventions    Readmission Risk Interventions Readmission Risk Prevention Plan 02/27/2021 07/07/2019  Transportation Screening Complete Complete  PCP or Specialist Appt within 3-5 Days - Complete  HRI or [place] - Complete  Social Work Consult for [place] Planning/Counseling - Complete  Palliative Care Screening -  Not Applicable  Medication Review (RN Care Manager) Complete Complete  PCP or Specialist appointment within 3-5 days of discharge Complete -  HRI or Home Care Consult Complete -  SW Recovery Care/Counseling Consult Complete -  Palliative Care Screening Not Applicable -  [place] Not Applicable -  Some recent data might be hidden

## 2021-02-27 NOTE — Progress Notes (Signed)
[facility] PROGRESS NOTE    Progress Note  [name]  QBV:[id number] DOB: 01-Jan-1961 DOA: 02/23/2021 PCP: [name], NP     Brief Narrative:   Daniel [name] is an 60 y.o. male past medical history of end-stage renal disease on hemodialysis alcohol abuse known history of peptic ulcer disease with an EGD on 02/09/2019 due to an AVM's, insulin-dependent diabetes mellitus comes into the ED for black tarry stools denies any NSAID or alcohol use, his hemoglobin at that time was 4 transfused 2 units of packed red blood cells started on Protonix drip. 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  There was one AVM in the duodenum which is actively bleeding treated with APC and transfused 3 additional units of packed red blood cells.    Assessment/Plan:   Gastrointestinal hemorrhage with melena: Due to AVMs of the small bowel EGD on 02/24/2021 showed bleeding AVM in the duodenum treated with APC. He has been transfused a total of 7 units of packed red blood cells. His hemoglobin today is 7.7. Anemia panel will be unreliable at this point. GI has signed off. Physical therapy has been consulted recommended no further physical therapy. Still with melanotic stools recheck a CBC tomorrow morning.  ESRD on hemodialysis: Last dialysis was on 02/26/2021 further management per renal.  Diet control diabetes mellitus type 2: With a last A1c of 5.4 in June. CBGs continue to be stable.  Essential hypertension: Relatively well controlled on Norvasc continue to monitor.  History of alcohol abuse/chronic HCC: Counseled follow-up with GI as an outpatient.  History of cocaine and tobacco abuse: Counseled.  Calciphylaxis: Get sodium thiosulfate with HD further management per renal.  Left heel wound/chronic present on admission: Wound care has been consulted continue daily  dressing changes.    DVT prophylaxis: scd Family Communication:none Status is: Inpatient  Remains inpatient appropriate because: Due to acute severity of illness of his GI bleed.  Still having melanotic stools.        Code Status:     Code Status Orders  (From admission, onward)           Start     Ordered   02/23/21 1315  Full code  Continuous        02/23/21 1315           Code Status History     Date Active Date Inactive Code Status Order ID Comments User Context   02/07/2021 1638 02/09/2021 1834 Full Code [id number]  Jonetta Osgood, MD ED   01/10/2021 1931 01/13/2021 2035 Full Code [id number]  Orma Flaming, MD ED   10/27/2020 2341 10/29/2020 0222 Full Code [id number]  Shalhoub, Sherryll Burger, MD ED   08/10/2020 0406 08/16/2020 2005 Full Code [id number]  Rise Patience, MD ED   07/31/2019 2033 08/09/2019 2019 Full Code [id number]  Jean Rosenthal, MD ED   07/23/2019 1838 07/25/2019 1628 Full Code [id number]  Seawell, Jaimie A, DO ED   07/06/2019 2147 07/07/2019 2140 Full Code [id number]  Ina Homes, MD ED   06/27/2019 1518 07/01/2019 1927 Full Code [id number]  Marty Heck, DO ED   03/29/2019 1121 03/31/2019 1716 Full Code [id number]  Katherine Roan, MD ED   01/22/2019 1842 01/24/2019 2155 Full Code [id number]  Kathrene Alu, MD ED   05/20/2017 2310 05/25/2017 1920 Full Code [id number]  Greta Doom, MD ED  06/29/2016 1113 07/01/2016 1523 Full Code [id number]  Benito Mccreedy, MD Inpatient   06/28/2013 0907 06/29/2013 0330 Full Code [id number]  Leota Jacobsen, MD ED         IV Access:   Peripheral IV   Procedures and diagnostic studies:   No results found.   Medical Consultants:   None.   Subjective:    [name] relates he still having melanotic stools.  Objective:    Vitals:   02/26/21 2356 02/27/21 0220 02/27/21 0422 02/27/21 0723  BP: 123/74 (!) 145/84  (!) 120/55  Pulse: 93 98  100  Resp: 19 15  17   Temp: 98.3 F (36.8 C)  98.3 F (36.8 C)  98 F (36.7 C)  TempSrc: Oral Oral  Oral  SpO2: 96% 98%  94%  Weight:   129.7 kg   Height:       SpO2: 94 %   Intake/Output Summary (Last 24 hours) at 02/27/2021 0925 Last data filed at 02/27/2021 0730 Gross per 24 hour  Intake 1073 ml  Output 1843 ml  Net -770 ml   Filed Weights   02/24/21 1707 02/26/21 0811 02/27/21 0422  Weight: 129.3 kg 132 kg 129.7 kg    Exam: General exam: In no acute distress. Respiratory system: Good air movement and clear to auscultation. Cardiovascular system: S1 & S2 heard, RRR. No JVD. Gastrointestinal system: Abdomen is nondistended, soft and nontender.  Extremities: No pedal edema. Skin: No rashes, lesions or ulcers  Data Reviewed:    Labs: Basic Metabolic Panel: Recent Labs  Lab 02/23/21 0948 02/23/21 1316 02/24/21 0533 02/25/21 0051 02/26/21 0052 02/27/21 0111  NA 140  --  138 139 138 137  K 3.7  --  3.9 4.5 4.0 3.8  CL 96*  --  99 99 98 99  CO2 29  --  23 24 23 25   GLUCOSE 98  --  90 151* 139* 133*  BUN 44*  --  52* 67* 75* 42*  CREATININE 9.33*  --  10.75* 12.15* 13.90* 9.58*  CALCIUM 8.2*  --  8.3* 8.7* 8.8* 8.5*  MG  --  2.1  --   --   --   --   PHOS  --   --   --  8.1*  --   --    GFR Estimated Creatinine Clearance: 11.1 mL/min (A) (by C-G formula based on SCr of 9.58 mg/dL (H)). Liver Function Tests: Recent Labs  Lab 02/23/21 0948 02/24/21 0533 02/25/21 0051  AST 79* 69*  --   ALT 24 21  --   ALKPHOS 69 67  --   BILITOT 0.9 0.9  --   PROT 7.5 6.6  --   ALBUMIN 2.6* 2.3* 2.3*   Recent Labs  Lab 02/23/21 0948  LIPASE 108*   No results for input(s): AMMONIA in the last 168 hours. Coagulation profile No results for input(s): INR, PROTIME in the last 168 hours. COVID-19 Labs  No results for input(s): DDIMER, FERRITIN, LDH, CRP in the last 72 hours.  Lab Results  Component Value Date   SARSCOV2NAA NEGATIVE 02/23/2021   SARSCOV2NAA NEGATIVE 02/08/2021   SARSCOV2NAA NEGATIVE 01/10/2021    [place] NEGATIVE 10/27/2020    CBC: Recent Labs  Lab 02/23/21 1002 02/23/21 1520 02/23/21 2204 02/24/21 0533 02/24/21 1855 02/25/21 0806 02/26/21 0052 02/26/21 1525 02/27/21 0111  WBC 5.4   < > 5.1 4.7  --  5.4 5.6  --  6.7  NEUTROABS 3.1  --   --   --   --   --   --   --   --  HGB 5.0*   < > 7.1* 6.8* 7.5* 8.1* 6.8* 8.4* 7.7*  HCT 15.7*   < > 21.3* 20.2* 22.9* 24.4* 21.2* 24.9* 22.7*  MCV 112.9*   < > 102.9* 102.0*  --  101.7* 104.4*  --  97.4  PLT 137*   < > 114* 125*  --  137* 130*  --  123*   < > = values in this interval not displayed.   Cardiac Enzymes: No results for input(s): CKTOTAL, CKMB, CKMBINDEX, TROPONINI in the last 168 hours. BNP (last 3 results) No results for input(s): PROBNP in the last 8760 hours. CBG: Recent Labs  Lab 02/26/21 0613 02/26/21 1230 02/26/21 1625 02/26/21 2105 02/27/21 0619  GLUCAP 119* 115* 123* 128* 98   D-Dimer: No results for input(s): DDIMER in the last 72 hours. Hgb A1c: No results for input(s): HGBA1C in the last 72 hours. Lipid Profile: No results for input(s): CHOL, HDL, LDLCALC, TRIG, CHOLHDL, LDLDIRECT in the last 72 hours. Thyroid function studies: No results for input(s): TSH, T4TOTAL, T3FREE, THYROIDAB in the last 72 hours.  Invalid input(s): FREET3 Anemia work up: No results for input(s): VITAMINB12, FOLATE, FERRITIN, TIBC, IRON, RETICCTPCT in the last 72 hours. Sepsis Labs: Recent Labs  Lab 02/23/21 0948 02/23/21 1002 02/23/21 1148 02/23/21 1520 02/24/21 0533 02/25/21 0806 02/26/21 0052 02/27/21 0111  WBC  --    < >  --    < > 4.7 5.4 5.6 6.7  LATICACIDVEN 1.4  --  1.4  --   --   --   --   --    < > = values in this interval not displayed.   Microbiology Recent Results (from the past 240 hour(s))  Resp Panel by RT-PCR (Flu A&B, Covid) Nasopharyngeal Swab     Status: None   Collection Time: 02/23/21  9:49 AM   Specimen: Nasopharyngeal Swab; Nasopharyngeal(NP) swabs in vial transport medium   Result Value Ref Range Status   SARS Coronavirus 2 by RT PCR NEGATIVE NEGATIVE Final    Comment: (NOTE) SARS-CoV-2 target nucleic acids are NOT DETECTED.  The SARS-CoV-2 RNA is generally detectable in upper respiratory specimens during the acute phase of infection. The lowest concentration of SARS-CoV-2 viral copies this assay can detect is 138 copies/mL. A negative result does not preclude SARS-Cov-2 infection and should not be used as the sole basis for treatment or other patient management decisions. A negative result may occur with  improper specimen collection/handling, submission of specimen other than nasopharyngeal swab, presence of viral mutation(s) within the areas targeted by this assay, and inadequate number of viral copies(<138 copies/mL). A negative result must be combined with clinical observations, patient history, and epidemiological information. The expected result is Negative.  Fact Sheet for Patients:  EntrepreneurPulse.com.au  Fact Sheet for Healthcare Providers:  IncredibleEmployment.be  This test is no t yet approved or cleared by the [place] FDA and  has been authorized for detection and/or diagnosis of SARS-CoV-2 by FDA under an Emergency Use Authorization (EUA). This EUA will remain  in effect (meaning this test can be used) for the duration of the COVID-19 declaration under Section 564(b)(1) of the Act, 21 U.S.C.section 360bbb-3(b)(1), unless the authorization is terminated  or revoked sooner.       Influenza A by PCR NEGATIVE NEGATIVE Final   Influenza B by PCR NEGATIVE NEGATIVE Final    Comment: (NOTE) The Xpert Xpress SARS-CoV-2/FLU/RSV plus assay is intended as an aid in the diagnosis of influenza from Nasopharyngeal swab specimens  and should not be used as a sole basis for treatment. Nasal washings and aspirates are unacceptable for Xpert Xpress SARS-CoV-2/FLU/RSV testing.  Fact Sheet for  Patients: EntrepreneurPulse.com.au  Fact Sheet for Healthcare Providers: IncredibleEmployment.be  This test is not yet approved or cleared by the [place] FDA and has been authorized for detection and/or diagnosis of SARS-CoV-2 by FDA under an Emergency Use Authorization (EUA). This EUA will remain in effect (meaning this test can be used) for the duration of the COVID-19 declaration under Section 564(b)(1) of the Act, 21 U.S.C. section 360bbb-3(b)(1), unless the authorization is terminated or revoked.  Performed at [facility], [place] [address] [place], [place] [address]   Culture, blood (routine x 2)     Status: None (Preliminary result)   Collection Time: 02/23/21 10:02 AM   Specimen: BLOOD RIGHT HAND  Result Value Ref Range Status   Specimen Description BLOOD RIGHT HAND  Final   Special Requests   Final    BOTTLES DRAWN AEROBIC AND ANAEROBIC Blood Culture adequate volume   Culture   Final    NO GROWTH 4 DAYS Performed at [facility], [place] [address], [place], [place] [address]    Report Status PENDING  Incomplete  Culture, blood (routine x 2)     Status: None (Preliminary result)   Collection Time: 02/23/21 11:55 AM   Specimen: BLOOD  Result Value Ref Range Status   Specimen Description BLOOD RIGHT ANTECUBITAL  Final   Special Requests   Final    BOTTLES DRAWN AEROBIC AND ANAEROBIC Blood Culture results may not be optimal due to an inadequate volume of blood received in culture bottles   Culture   Final    NO GROWTH 4 DAYS Performed at [facility], [place] [address], [place], [place] [address]    Report Status PENDING  Incomplete  MRSA Next Gen by PCR, Nasal     Status: None   Collection Time: 02/24/21  5:48 PM   Specimen: Nasal Mucosa; Nasal Swab  Result Value Ref Range Status   MRSA by PCR Next Gen NOT DETECTED NOT DETECTED Final    Comment: (NOTE) The GeneXpert MRSA Assay (FDA approved for NASAL specimens  only), is one component of a comprehensive MRSA colonization surveillance program. It is not intended to diagnose MRSA infection nor to guide or monitor treatment for MRSA infections. Test performance is not FDA approved in patients less than 41 years old. Performed at [facility], [place] [address], [place],  [address]      Medications:    sodium chloride   Intravenous Once   amLODipine  10 mg Oral Daily   Chlorhexidine Gluconate Cloth  6 each Topical Q0600   darbepoetin (ARANESP) injection - DIALYSIS  200 mcg Intravenous Q Tue-HD   doxepin  10 mg Oral QHS   feeding supplement (NEPRO CARB STEADY)  237 mL Oral BID BM   folic acid  1 mg Oral Daily   gabapentin  300 mg Oral QHS   hydrocerin   Topical BID   hydrOXYzine  25 mg Oral BID   insulin aspart  0-6 Units Subcutaneous TID WC   lanthanum  1,000 mg Oral TID WC   loratadine  10 mg Oral Daily   multivitamin  1 tablet Oral QHS   nicotine  21 mg Transdermal Daily   pantoprazole  40 mg Oral BID   sodium chloride flush  3 mL Intravenous Q12H   sucralfate  1 g Oral Daily   thiamine  100 mg Oral Daily   Continuous Infusions:  sodium chloride     sodium thiosulfate infusion for calciphylaxis        LOS: 4 days   Charlynne Cousins  [facility]  02/27/2021, 9:25 AM

## 2021-02-27 NOTE — Progress Notes (Signed)
PT Cancellation Note  Patient Details Name: [name] MRN: [id number] DOB: 1960-12-18   Cancelled Treatment:    Reason Eval/Treat Not Completed: (P) Patient declined, no reason specified Pt reports his stomach is feeling better but he is too tired to walk now. Pt states he will walk with PT tomorrow.   Rosalea Withrow B. Migdalia Dk PT, DPT Acute Rehabilitation Services Pager [phone] Office [phone]   [place] 02/27/2021, 2:47 PM

## 2021-02-28 ENCOUNTER — Other Ambulatory Visit (HOSPITAL_COMMUNITY): Payer: Self-pay

## 2021-02-28 DIAGNOSIS — K921 Melena: Secondary | ICD-10-CM | POA: Diagnosis not present

## 2021-02-28 DIAGNOSIS — K922 Gastrointestinal hemorrhage, unspecified: Secondary | ICD-10-CM

## 2021-02-28 DIAGNOSIS — N186 End stage renal disease: Secondary | ICD-10-CM | POA: Diagnosis not present

## 2021-02-28 DIAGNOSIS — D62 Acute posthemorrhagic anemia: Secondary | ICD-10-CM | POA: Diagnosis not present

## 2021-02-28 LAB — GLUCOSE, CAPILLARY
Glucose-Capillary: 113 mg/dL — ABNORMAL HIGH (ref 70–99)
Glucose-Capillary: 135 mg/dL — ABNORMAL HIGH (ref 70–99)

## 2021-02-28 LAB — CULTURE, BLOOD (ROUTINE X 2)
Culture: NO GROWTH
Culture: NO GROWTH
Special Requests: ADEQUATE

## 2021-02-28 LAB — CBC
HCT: 24.5 % — ABNORMAL LOW (ref 39.0–52.0)
Hemoglobin: 8 g/dL — ABNORMAL LOW (ref 13.0–17.0)
MCH: 33.2 pg (ref 26.0–34.0)
MCHC: 32.7 g/dL (ref 30.0–36.0)
MCV: 101.7 fL — ABNORMAL HIGH (ref 80.0–100.0)
Platelets: 126 10*3/uL — ABNORMAL LOW (ref 150–400)
RBC: 2.41 MIL/uL — ABNORMAL LOW (ref 4.22–5.81)
RDW: 22 % — ABNORMAL HIGH (ref 11.5–15.5)
WBC: 5 10*3/uL (ref 4.0–10.5)
nRBC: 2 % — ABNORMAL HIGH (ref 0.0–0.2)

## 2021-02-28 MED ORDER — DIPHENHYDRAMINE HCL 50 MG/ML IJ SOLN
25.0000 mg | Freq: Once | INTRAMUSCULAR | Status: AC
  Administered 2021-02-28: 25 mg via INTRAVENOUS
  Filled 2021-02-28: qty 1

## 2021-02-28 MED ORDER — SUCROFERRIC OXYHYDROXIDE 500 MG PO CHEW
500.0000 mg | CHEWABLE_TABLET | Freq: Three times a day (TID) | ORAL | Status: DC
  Filled 2021-02-28: qty 1

## 2021-02-28 MED ORDER — PANTOPRAZOLE SODIUM 40 MG PO TBEC
40.0000 mg | DELAYED_RELEASE_TABLET | Freq: Two times a day (BID) | ORAL | 2 refills | Status: DC
  Filled 2021-02-28 (×2): qty 60, 30d supply, fill #0

## 2021-02-28 MED ORDER — SUCRALFATE 1 G PO TABS
1.0000 g | ORAL_TABLET | Freq: Every day | ORAL | 3 refills | Status: DC
  Filled 2021-02-28: qty 30, 30d supply, fill #0

## 2021-02-28 MED ORDER — LANTHANUM CARBONATE 1000 MG PO CHEW
1000.0000 mg | CHEWABLE_TABLET | Freq: Three times a day (TID) | ORAL | 0 refills | Status: DC
  Filled 2021-02-28: qty 90, 30d supply, fill #0

## 2021-02-28 MED ORDER — CALCIUM ACETATE (PHOS BINDER) 667 MG PO CAPS
1334.0000 mg | ORAL_CAPSULE | Freq: Three times a day (TID) | ORAL | 0 refills | Status: DC
  Filled 2021-02-28: qty 180, 30d supply, fill #0

## 2021-02-28 NOTE — Discharge Summary (Addendum)
Physician Discharge Summary  Daniel [name]:[id number] DOB: [birth year] DOA: 02/23/2021  PCP: [name], NP  Admit date: 02/23/2021 Discharge date: 02/28/2021  Admitted From: Home Disposition:  Home  Recommendations for Outpatient Follow-up:  Follow up with PCP in 1-2 weeks Please obtain BMP/CBC in one week   Home Health:no Equipment/Devices:None  Discharge Condition:Stable CODE STATUS:Full Diet recommendation: Heart Healthy  Brief/Interim Summary:  60 y.o. male past medical history of end-stage renal disease on hemodialysis alcohol abuse known history of peptic ulcer disease with an EGD on 02/09/2019 due to an AVM's, insulin-dependent diabetes mellitus comes into the ED for black tarry stools denies any NSAID or alcohol use, his hemoglobin at that time was 4 transfused 2 units of packed red blood cells started on Protonix drip. 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  There was one AVM in the duodenum which is actively bleeding treated with APC and transfused 3 additional units of packed red blood cells.  Discharge Diagnoses:  Principal Problem:   Gastrointestinal hemorrhage with melena Active Problems:   Essential hypertension   DM2 (diabetes mellitus, type 2) (HCC)   Chronic hepatitis C without hepatic coma (HCC)   Symptomatic anemia   ESRD on hemodialysis (HCC)   History of alcohol abuse   Nicotine dependence, cigarettes, uncomplicated   Anemia due to chronic kidney disease   Prolonged QT interval   Acute blood loss anemia   Benign neoplasm of duodenum, jejunum, and ileum  Acute upper GI bleed with melena/acute blood loss anemia: Likely due to AVM small bowel, EGD done on 02/24/2021 showed bleeding AVM in the duodenum treated with APC. During his hospital stay had to be transfused 7 units of packed red blood cells his hemoglobin this morning is 8.2. His  anemia panel will be unreliable. GI has signed off. Physical therapy evaluated the patient and recommended no physical needs. CBC this morning significantly improved.  End-stage renal disease on hemodialysis: He will continue his regular dialysis treatments nephrology was consulted during this admission.  Diet controlled diabetes mellitus type 2: With an A1c of 5.4 in June 2 BG seems to be stable on current regimen.   Essential hypertension: No changes made to his medication continue current regimen.  History of alcohol abuse/chronic hepatitis C: Follow-up with GI as an outpatient.  History of cocaine and tobacco abuse: Counseling.  Calciphylaxis: He gets sodium thiosulfate with HD continue further management per renal.  Left heel wound/chronic present on admission: Continue daily wound dressings.    Discharge Instructions  Discharge Instructions     Diet - low sodium heart healthy   Complete by: As directed    Discharge wound care:   Complete by: As directed    As per wound care instructions   Increase activity slowly   Complete by: As directed       Allergies as of 02/28/2021       Reactions   Dilaudid [hydromorphone Hcl] Itching, Other (See Comments)   Pt reports itchiness after IM injection         Medication List     STOP taking these medications    Calcium Acetate 667 MG Tabs   cetirizine 10 MG tablet Commonly known as: ZyrTEC Allergy   sucralfate 1 g tablet Commonly known as: CARAFATE       TAKE these medications    amLODipine 10 MG tablet Commonly known as: NORVASC Take  10 mg by mouth in the morning and at bedtime.   blood glucose meter kit and supplies Kit Dispense based on patient and insurance preference. Use up to four times daily as directed. (FOR ICD-9 250.00, 250.01).   cloNIDine 0.2 MG tablet Commonly known as: CATAPRES Take  1 tablet twice daily . What changed:  how much to take how to take this when to take this    doxepin 10 MG capsule Commonly known as: SINEQUAN Take 10 mg by mouth at bedtime.   gabapentin 300 MG capsule Commonly known as: NEURONTIN Take 300 mg by mouth in the morning, at noon, and at bedtime. What changed: Another medication with the same name was removed. Continue taking this medication, and follow the directions you see here.   hydrOXYzine 25 MG tablet Commonly known as: ATARAX/VISTARIL TAKE 1 TABLET BY MOUTH EVERY 8 HOURS AS NEEDED FOR ANXIETY OR  ITCHING What changed: See the new instructions.   lidocaine-prilocaine cream Commonly known as: EMLA Apply 1 application topically once.   pantoprazole 40 MG tablet Commonly known as: PROTONIX Take 1 tablet (40 mg total) by mouth 2 (two) times daily.               Discharge Care Instructions  (From admission, onward)           Start     Ordered   02/28/21 0000  Discharge wound care:       Comments: As per wound care instructions   02/28/21 0846            Follow-up Information     [name], NP Follow up.   Specialty: Internal Medicine Why: Nov. 16 2022 _0 :10PM Contact information: [address] [place] [address] [phone]                Allergies  Allergen Reactions   Dilaudid [Hydromorphone Hcl] Itching and Other (See Comments)    Pt reports itchiness after IM injection     Consultations: Gastroenterology   Procedures/Studies: CT ABDOMEN PELVIS WO CONTRAST  Result Date: 02/07/2021 CLINICAL DATA:  Abdominal distension EXAM: CT ABDOMEN AND PELVIS WITHOUT CONTRAST TECHNIQUE: Multidetector CT imaging of the abdomen and pelvis was performed following the standard protocol without IV contrast. COMPARISON:  CT abdomen and pelvis dated October 27, 2020 FINDINGS: Lower chest: Normal heart size. No pericardial effusion. Coronary artery calcifications. Mild bibasilar atelectasis. No acute abnormality. Hepatobiliary: No focal liver abnormality. Cholelithiasis with possible  mild gallbladder wall thickening. No biliary ductal dilation. Pancreas: Unremarkable. No pancreatic ductal dilatation or surrounding inflammatory changes. Spleen: Normal in size without focal abnormality. Adrenals/Urinary Tract: Adrenal glands are unremarkable. Atrophic bilateral kidneys no hydronephrosis. Punctate nonobstructing stones of the left kidney. Bladder is unremarkable. Stomach/Bowel: Stomach is within normal limits. Appendix is surgically absent. No evidence of bowel wall thickening, distention, or inflammatory changes. Vascular/Lymphatic: Aortic atherosclerosis. No enlarged abdominal or pelvic lymph nodes. Reproductive: Prostate is unremarkable. Other: No abdominal wall hernia or abnormality. No abdominopelvic ascites. Musculoskeletal: No acute or significant osseous findings. IMPRESSION: Cholelithiasis with possible mild gallbladder wall thickening. Correlate for symptoms of right upper quadrant pain and consider further evaluation with gallbladder ultrasound. Otherwise, no acute findings in the abdomen or pelvis. Aortic aneurysm NOS (ICD10-I71.9). Electronically Signed   By: Yetta Glassman M.D.   On: 02/07/2021 13:38   DG Chest 2 View  Result Date: 02/07/2021 CLINICAL DATA:  Weakness, epigastric pain EXAM: CHEST - 2 VIEW COMPARISON:  01/06/2021 FINDINGS: Unchanged cardiac and mediastinal  contours, with redemonstrated cardiac enlargement and aortic atherosclerosis. Low lung volumes. Mild pulmonary interstitial thickening. No focal pulmonary opacity. No pleural effusion or pneumothorax. No acute osseous abnormality. IMPRESSION: Cardiomegaly and mild pulmonary interstitial thickening, which could be related to chronic bronchitis or pulmonary venous congestion. Electronically Signed   By: Merilyn Baba M.D.   On: 02/07/2021 12:02   DG Chest Port 1 View  Result Date: 02/23/2021 CLINICAL DATA:  Syncope EXAM: PORTABLE CHEST 1 VIEW COMPARISON:  Previous studies including the examination of  02/07/2021 FINDINGS: Transverse diameter of heart is increased. There are no signs of pulmonary edema. There is no focal pulmonary consolidation. There is no significant pleural effusion or pneumothorax. IMPRESSION: Cardiomegaly. There are no signs of pulmonary edema or focal pulmonary consolidation. Electronically Signed   By: Elmer Picker M.D.   On: 02/23/2021 11:08   DG Knee Complete 4 Views Left  Result Date: 02/11/2021 CLINICAL DATA:  Leg weakness, recent fall EXAM: LEFT KNEE - COMPLETE 4+ VIEW COMPARISON:  CT 01/05/2021 FINDINGS: No fracture, dislocation, or effusion. No significant osseous degenerative change. Old proximal tibial shaft bone infarct. Extensive femoropopliteal and tibial arterial calcifications. IMPRESSION: 1. No acute bone findings. 2. Extensive arterial calcifications. Electronically Signed   By: Lucrezia Europe M.D.   On: 02/11/2021 07:49   DG Knee Complete 4 Views Right  Result Date: 02/11/2021 CLINICAL DATA:  Weakness, recent fall EXAM: RIGHT KNEE - COMPLETE 4+ VIEW COMPARISON:  CT 01/05/2021 FINDINGS: No evidence of fracture, dislocation, or joint effusion. 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 No evidence of arthropathy or acute bone abnormality. Extensive arterial calcifications. IMPRESSION: 1. No acute bone findings. 2. Extensive arterial calcifications Electronically Signed   By: Lucrezia Europe M.D.   On: 02/11/2021 07:50   US Abdomen Limited RUQ (LIVER/GB)  Result Date: 02/07/2021 CLINICAL DATA:  Cholelithiasis. Question cholecystitis. Abdominal distension. EXAM: ULTRASOUND ABDOMEN LIMITED RIGHT UPPER QUADRANT COMPARISON:  CT abdomen pelvis 02/07/2021 FINDINGS: Gallbladder: Calcified gallstones noted within the gallbladder lumen. Associated gallbladder wall thickening. No definite pericholecystic fluid; however, limited evaluation deep ended tray shin of the ultrasound waves. No sonographic Murphy sign noted by sonographer. Common bile duct: Diameter: 4 mm.  Liver: No focal lesion identified. Slightly heterogeneous hepatic parenchyma which may be due to ultrasound technique. Otherwise normal in parenchymal echogenicity. Portal vein is patent on color Doppler imaging with normal direction of blood flow towards the liver. Other: None. IMPRESSION: 1. Cholelithiasis with associated nonspecific gallbladder wall thickening. No pericholecystic fluid or sonographic Percell Miller sign reported. Gallbladder wall thickening can be seen in the setting of chronic liver disease. Correlate with clinical exam for acute cholecystitis. 2. Limited ultrasound to penetration of sonographic waves. Electronically Signed   By: Iven Finn M.D.   On: 02/07/2021 15:33   (Echo, Carotid, EGD, Colonoscopy, ERCP)    Subjective: He is tolerating his diet able to no new complaints this morning.  He relates his stools have improved  Discharge Exam: Vitals:   02/28/21 1005 02/28/21 1010  BP: 135/74 (!) 124/51  Pulse: (!) 102 100  Resp: 17 17  Temp: 98 F (36.7 C)   SpO2: 98%    Vitals:   02/28/21 0327 02/28/21 0727 02/28/21 1005 02/28/21 1010  BP: 115/65 114/69 135/74 (!) 124/51  Pulse: 92 99 (!) 102 100  Resp: _0 Temp: 98 F (36.7 C) 98.6 F (37 C) 98 F (36.7 C)   TempSrc: Oral Oral Oral   SpO2: 96% 99% 98%  Weight:   130.9 kg   Height:        General: Pt is alert, awake, not in acute distress Cardiovascular: RRR, S1/S2 +, no rubs, no gallops Respiratory: CTA bilaterally, no wheezing, no rhonchi Abdominal: Soft, NT, ND, bowel sounds + Extremities: no edema, no cyanosis    The results of significant diagnostics from this hospitalization (including imaging, microbiology, ancillary and laboratory) are listed below for reference.     Microbiology: Recent Results (from the past 240 hour(s))  Resp Panel by RT-PCR (Flu A&B, Covid) Nasopharyngeal Swab     Status: None   Collection Time: 02/23/21  9:49 AM   Specimen: Nasopharyngeal Swab; Nasopharyngeal(NP)  swabs in vial transport medium  Result Value Ref Range Status   SARS Coronavirus 2 by RT PCR NEGATIVE NEGATIVE Final    Comment: (NOTE) SARS-CoV-2 target nucleic acids are NOT DETECTED.  The SARS-CoV-2 RNA is generally detectable in upper respiratory specimens during the acute phase of infection. The lowest concentration of SARS-CoV-2 viral copies this assay can detect is 138 copies/mL. A negative result does not preclude SARS-Cov-2 infection and should not be used as the sole basis for treatment or other patient management decisions. A negative result may occur with  improper specimen collection/handling, submission of specimen other than nasopharyngeal swab, presence of viral mutation(s) within the areas targeted by this assay, and inadequate number of viral copies(<138 copies/mL). A negative result must be combined with clinical observations, patient history, and epidemiological information. The expected result is Negative.  Fact Sheet for Patients:  EntrepreneurPulse.com.au  Fact Sheet for Healthcare Providers:  IncredibleEmployment.be  This test is no t yet approved or cleared by the [place] FDA and  has been authorized for detection and/or diagnosis of SARS-CoV-2 by FDA under an Emergency Use Authorization (EUA). This EUA will remain  in effect (meaning this test can be used) for the duration of the COVID-19 declaration under Section 564(b)(1) of the Act, 21 U.S.C.section 360bbb-3(b)(1), unless the authorization is terminated  or revoked sooner.       Influenza A by PCR NEGATIVE NEGATIVE Final   Influenza B by PCR NEGATIVE NEGATIVE Final    Comment: (NOTE) The Xpert Xpress SARS-CoV-2/FLU/RSV plus assay is intended as an aid in the diagnosis of influenza from Nasopharyngeal swab specimens and should not be used as a sole basis for treatment. Nasal washings and aspirates are unacceptable for Xpert Xpress  SARS-CoV-2/FLU/RSV testing.  Fact Sheet for Patients: EntrepreneurPulse.com.au  Fact Sheet for Healthcare Providers: IncredibleEmployment.be  This test is not yet approved or cleared by the [place] FDA and has been authorized for detection and/or diagnosis of SARS-CoV-2 by FDA under an Emergency Use Authorization (EUA). This EUA will remain in effect (meaning this test can be used) for the duration of the COVID-19 declaration under Section 564(b)(1) of the Act, 21 U.S.C. section 360bbb-3(b)(1), unless the authorization is terminated or revoked.  Performed at [facility], [place] [address], [place], [place] [address]   Culture, blood (routine x 2)     Status: None   Collection Time: 02/23/21 10:02 AM   Specimen: BLOOD RIGHT HAND  Result Value Ref Range Status   Specimen Description BLOOD RIGHT HAND  Final   Special Requests   Final    BOTTLES DRAWN AEROBIC AND ANAEROBIC Blood Culture adequate volume   Culture   Final    NO GROWTH 5 DAYS Performed at [facility], [place] [address] [place], [place] [address]  Report Status 02/28/2021 FINAL  Final  Culture, blood (routine x 2)     Status: None   Collection Time: 02/23/21 11:55 AM   Specimen: BLOOD  Result Value Ref Range Status   Specimen Description BLOOD RIGHT ANTECUBITAL  Final   Special Requests   Final    BOTTLES DRAWN AEROBIC AND ANAEROBIC Blood Culture results may not be optimal due to an inadequate volume of blood received in culture bottles   Culture   Final    NO GROWTH 5 DAYS Performed at [facility], [place] [address] [place], [place] [address]    Report Status 02/28/2021 FINAL  Final  MRSA Next Gen by PCR, Nasal     Status: None   Collection Time: 02/24/21  5:48 PM   Specimen: Nasal Mucosa; Nasal Swab  Result Value Ref Range Status   MRSA by PCR Next Gen NOT DETECTED NOT DETECTED Final    Comment: (NOTE) The GeneXpert MRSA Assay (FDA approved for NASAL  specimens only), is one component of a comprehensive MRSA colonization surveillance program. It is not intended to diagnose MRSA infection nor to guide or monitor treatment for MRSA infections. Test performance is not FDA approved in patients less than 63 years old. Performed at [facility], [place] [address], [place], [place] [address]      Labs: BNP (last 3 results) Recent Labs    09/09/20 1116 01/05/21 0505 01/06/21 1804  BNP 516.1* 87.9 712.1*   Basic Metabolic Panel: Recent Labs  Lab 02/23/21 0948 02/23/21 1316 02/24/21 0533 02/25/21 0051 02/26/21 0052 02/27/21 0111  NA 140  --  138 139 138 137  K 3.7  --  3.9 4.5 4.0 3.8  CL 96*  --  99 99 98 99  CO2 29  --  _0 GLUCOSE 98  --  90 151* 139* 133*  BUN 44*  --  52* 67* 75* 42*  CREATININE 9.33*  --  10.75* 12.15* 13.90* 9.58*  CALCIUM 8.2*  --  8.3* 8.7* 8.8* 8.5*  MG  --  2.1  --   --   --   --   PHOS  --   --   --  8.1*  --   --    Liver Function Tests: Recent Labs  Lab 02/23/21 0948 02/24/21 0533 02/25/21 0051  AST 79* 69*  --   ALT 24 21  --   ALKPHOS 69 67  --   BILITOT 0.9 0.9  --   PROT 7.5 6.6  --   ALBUMIN 2.6* 2.3* 2.3*   Recent Labs  Lab 02/23/21 0948  LIPASE 108*   No results for input(s): AMMONIA in the last 168 hours. CBC: Recent Labs  Lab 02/23/21 1002 02/23/21 1520 02/24/21 0533 02/24/21 1855 02/25/21 0806 02/26/21 0052 02/26/21 1525 02/27/21 0111 02/28/21 0100  WBC 5.4   < > 4.7  --  5.4 5.6  --  6.7 5.0  NEUTROABS 3.1  --   --   --   --   --   --   --   --   HGB 5.0*   < > 6.8*   < > 8.1* 6.8* 8.4* 7.7* 8.0*  HCT 15.7*   < > 20.2*   < > 24.4* 21.2* 24.9* 22.7* 24.5*  MCV 112.9*   < > 102.0*  --  101.7* 104.4*  --  97.4 101.7*  PLT 137*   < > 125*  --  137*  130*  --  123* 126*   < > = values in this interval not displayed.   Cardiac Enzymes: No results for input(s): CKTOTAL, CKMB, CKMBINDEX, TROPONINI in the last 168 hours. BNP: Invalid input(s):  POCBNP CBG: Recent Labs  Lab 02/27/21 0619 02/27/21 1132 02/27/21 1642 02/27/21 2132 02/28/21 0628  GLUCAP 98 120* 119* 111* 113*   D-Dimer No results for input(s): DDIMER in the last 72 hours. Hgb A1c No results for input(s): HGBA1C in the last 72 hours. Lipid Profile No results for input(s): CHOL, HDL, LDLCALC, TRIG, CHOLHDL, LDLDIRECT in the last 72 hours. Thyroid function studies No results for input(s): TSH, T4TOTAL, T3FREE, THYROIDAB in the last 72 hours.  Invalid input(s): FREET3 Anemia work up No results for input(s): VITAMINB12, FOLATE, FERRITIN, TIBC, IRON, RETICCTPCT in the last 72 hours. Urinalysis    Component Value Date/Time   COLORURINE YELLOW 07/31/2019 2128   APPEARANCEUR CLEAR 07/31/2019 2128   LABSPEC 1.009 07/31/2019 2128   PHURINE 5.0 07/31/2019 2128   GLUCOSEU NEGATIVE 07/31/2019 2128   HGBUR SMALL (A) 07/31/2019 2128   BILIRUBINUR NEGATIVE 07/31/2019 2128   KETONESUR NEGATIVE 07/31/2019 2128   PROTEINUR 100 (A) 07/31/2019 2128   UROBILINOGEN 0.2 12/31/2009 0746   NITRITE NEGATIVE 07/31/2019 2128   LEUKOCYTESUR NEGATIVE 07/31/2019 2128   Sepsis Labs Invalid input(s): PROCALCITONIN,  WBC,  LACTICIDVEN Microbiology Recent Results (from the past 240 hour(s))  Resp Panel by RT-PCR (Flu A&B, Covid) Nasopharyngeal Swab     Status: None   Collection Time: 02/23/21  9:49 AM   Specimen: Nasopharyngeal Swab; Nasopharyngeal(NP) swabs in vial transport medium  Result Value Ref Range Status   SARS Coronavirus 2 by RT PCR NEGATIVE NEGATIVE Final    Comment: (NOTE) SARS-CoV-2 target nucleic acids are NOT DETECTED.  The SARS-CoV-2 RNA is generally detectable in upper respiratory specimens during the acute phase of infection. The lowest concentration of SARS-CoV-2 viral copies this assay can detect is 138 copies/mL. A negative result does not preclude SARS-Cov-2 infection and should not be used as the sole basis for treatment or other patient management  decisions. A negative result may occur with  improper specimen collection/handling, submission of specimen other than nasopharyngeal swab, presence of viral mutation(s) within the areas targeted by this assay, and inadequate number of viral copies(<138 copies/mL). A negative result must be combined with clinical observations, patient history, and epidemiological information. The expected result is Negative.  Fact Sheet for Patients:  EntrepreneurPulse.com.au  Fact Sheet for Healthcare Providers:  IncredibleEmployment.be  This test is no t yet approved or cleared by the [place] FDA and  has been authorized for detection and/or diagnosis of SARS-CoV-2 by FDA under an Emergency Use Authorization (EUA). This EUA will remain  in effect (meaning this test can be used) for the duration of the COVID-19 declaration under Section 564(b)(1) of the Act, 21 U.S.C.section 360bbb-3(b)(1), unless the authorization is terminated  or revoked sooner.       Influenza A by PCR NEGATIVE NEGATIVE Final   Influenza B by PCR NEGATIVE NEGATIVE Final    Comment: (NOTE) The Xpert Xpress SARS-CoV-2/FLU/RSV plus assay is intended as an aid in the diagnosis of influenza from Nasopharyngeal swab specimens and should not be used as a sole basis for treatment. Nasal washings and aspirates are unacceptable for Xpert Xpress SARS-CoV-2/FLU/RSV testing.  Fact Sheet for Patients: EntrepreneurPulse.com.au  Fact Sheet for Healthcare Providers: IncredibleEmployment.be  This test is not yet approved or cleared by the [place] FDA and has  been authorized for detection and/or diagnosis of SARS-CoV-2 by FDA under an Emergency Use Authorization (EUA). This EUA will remain in effect (meaning this test can be used) for the duration of the COVID-19 declaration under Section 564(b)(1) of the Act, 21 U.S.C. section 360bbb-3(b)(1), unless the  authorization is terminated or revoked.  Performed at [facility], [place] [address] [place], [place] [address]   Culture, blood (routine x 2)     Status: None   Collection Time: 02/23/21 10:02 AM   Specimen: BLOOD RIGHT HAND  Result Value Ref Range Status   Specimen Description BLOOD RIGHT HAND  Final   Special Requests   Final    BOTTLES DRAWN AEROBIC AND ANAEROBIC Blood Culture adequate volume   Culture   Final    NO GROWTH 5 DAYS Performed at [facility], [place] [address], [place], [place] [address]    Report Status 02/28/2021 FINAL  Final  Culture, blood (routine x 2)     Status: None   Collection Time: 02/23/21 11:55 AM   Specimen: BLOOD  Result Value Ref Range Status   Specimen Description BLOOD RIGHT ANTECUBITAL  Final   Special Requests   Final    BOTTLES DRAWN AEROBIC AND ANAEROBIC Blood Culture results may not be optimal due to an inadequate volume of blood received in culture bottles   Culture   Final    NO GROWTH 5 DAYS Performed at [facility], [place] [address] [place], [place] [address]    Report Status 02/28/2021 FINAL  Final  MRSA Next Gen by PCR, Nasal     Status: None   Collection Time: 02/24/21  5:48 PM   Specimen: Nasal Mucosa; Nasal Swab  Result Value Ref Range Status   MRSA by PCR Next Gen NOT DETECTED NOT DETECTED Final    Comment: (NOTE) The GeneXpert MRSA Assay (FDA approved for NASAL specimens only), is one component of a comprehensive MRSA colonization surveillance program. It is not intended to diagnose MRSA infection nor to guide or monitor treatment for MRSA infections. Test performance is not FDA approved in patients less than 32 years old. Performed at [facility], [place] [address], [place], [place] [address]       SIGNED:   Charlynne Cousins, MD  [facility] 02/28/2021, 10:44 AM Pager   If 7PM-7AM, please contact night-coverage [email or web address] Password [email or web address]

## 2021-02-28 NOTE — Progress Notes (Signed)
Subjective: seen on HD, no c/o's   Objective Vital signs in last 24 hours: Vitals:   02/28/21 1030 02/28/21 1100 02/28/21 1130 02/28/21 1200  BP: (!) 100/54 107/67 (!) 112/46 (!) 105/40  Pulse: 100 (!) 104 (!) 102 98  Resp: 16 20 17 19   Temp:      TempSrc:      SpO2:      Weight:      Height:       Weight change:   Physical Exam: General: Alert obese African-American male NAD Heart: RRR, no MRG Lungs: CTA nonlabored breathing Abdomen: Obese, NABS, nontender nondistended  Extremities: Bilateral pedal edema trace With scattered hyperpigmented macular rash on lower extremities w/ one left calf dark ulcerated lesion Dialysis Access: + bruit LUA aVF   OP HD: [facility]  4h  131kg  2/2 bath  Hep none LUA AVF   - mircera 225 q2, last 10/04  - hect 1ug IV tiw  - Na thiosulfate 25 gm tiw IV  Assessment/Plan: Symptomatic anemia/ recurrent GI bleed- Hgb 5.0 on admit. 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 SP 7u prbc's. Hb 8.0 today and stable.  ESRD -HD TTS. HD today. No heparin w/ HD.  Hypertension/volume - at dry wt, no vol excess on exam or CXR.  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 Phoslo dc'd as binder, also dc'd hectorol. Fosrenol started as non-Ca++ binder.  Nutrition -albumin 2.3 use protein supplements when taking PTO's Left heel wound-  per admit wound care to see Transaminitis--  history of chronic hep C /history of alcohol use . Diabetes mellitus type 2 - per admit History of substance abuse ( alcohol, cocaine and tobacco)   Kelly Splinter, MD 02/28/2021, 12:43 PM      Labs: Basic Metabolic Panel: Recent Labs  Lab 02/25/21 0051 02/26/21 0052 02/27/21 0111  NA 139 138 137  K 4.5 4.0 3.8  CL 99 98 99  CO2 24 23 25   GLUCOSE 151* 139* 133*  BUN 67* 75* 42*  CREATININE 12.15* 13.90* 9.58*  CALCIUM  8.7* 8.8* 8.5*  PHOS 8.1*  --   --     Liver Function Tests: Recent Labs  Lab 02/23/21 0948 02/24/21 0533 02/25/21 0051  AST 79* 69*  --   ALT 24 21  --   ALKPHOS 69 67  --   BILITOT 0.9 0.9  --   PROT 7.5 6.6  --   ALBUMIN 2.6* 2.3* 2.3*    Recent Labs  Lab 02/23/21 0948  LIPASE 108*    No results for input(s): AMMONIA in the last 168 hours. CBC: Recent Labs  Lab 02/23/21 1002 02/23/21 1520 02/24/21 0533 02/24/21 1855 02/25/21 0806 02/26/21 0052 02/26/21 1525 02/27/21 0111 02/28/21 0100  WBC 5.4   < > 4.7  --  5.4 5.6  --  6.7 5.0  NEUTROABS 3.1  --   --   --   --   --   --   --   --   HGB 5.0*   < > 6.8*   < > 8.1* 6.8* 8.4* 7.7* 8.0*  HCT 15.7*   < > 20.2*   < > 24.4* 21.2* 24.9* 22.7* 24.5*  MCV 112.9*   < > 102.0*  --  101.7* 104.4*  --  97.4 101.7*  PLT 137*   < > 125*  --  137* 130*  --  123* 126*   < > = values in this interval not displayed.    Cardiac Enzymes: No results for input(s): CKTOTAL, CKMB, CKMBINDEX, TROPONINI in the last 168 hours. CBG: Recent Labs  Lab 02/27/21 0619 02/27/21 1132 02/27/21 1642 02/27/21 2132 02/28/21 0628  GLUCAP 98 120* 119* 111* 113*     Studies/Results: No results found. Medications:  sodium chloride     sodium thiosulfate infusion for calciphylaxis 25 g (02/28/21 1159)    sodium chloride   Intravenous Once   amLODipine  10 mg Oral Daily   Chlorhexidine Gluconate Cloth  6 each Topical Q0600   darbepoetin (ARANESP) injection - DIALYSIS  200 mcg Intravenous Q Tue-HD   doxepin  10 mg Oral QHS   feeding supplement (NEPRO CARB STEADY)  237 mL Oral BID BM   folic acid  1 mg Oral Daily   gabapentin  300 mg Oral QHS   hydrocerin   Topical BID   hydrOXYzine  25 mg Oral BID   insulin aspart  0-6 Units Subcutaneous TID WC   loratadine  10 mg Oral Daily   multivitamin  1 tablet Oral QHS   nicotine  21 mg Transdermal Daily   pantoprazole  40 mg Oral BID   sodium chloride flush  3 mL Intravenous Q12H    sucralfate  1 g Oral Daily   sucroferric oxyhydroxide  500 mg Oral TID WC   thiamine  100 mg Oral Daily

## 2021-02-28 NOTE — TOC Transition Note (Addendum)
Transition of Care [facility]) - CM/SW Discharge Note   Patient Details  Name: Daniel [name] MRN: [id number] Date of Birth: [birth year]  [facility] CM/SW Contact:  Zenon Mayo, RN Phone Number: 02/28/2021, 9:27 AM   Clinical Narrative:    Patient is for dc today, he states he needs transportation to the [facility] to pick up his car at discharge, he will receive dialysis here at the hospital then discharge.  Secretary will assist with transport with [employer].  He states he is independent and does not need any DME .     Final next level of care: Home/Self Care Barriers to Discharge: No Barriers Identified   Patient Goals and CMS Choice Patient states their goals for this hospitalization and ongoing recovery are:: return home independently   Choice offered to / list presented to : NA  Discharge Placement                       Discharge Plan and Services   Discharge Planning Services: CM Consult              DME Agency: NA       HH Arranged: NA          Social Determinants of Health (SDOH) Interventions     Readmission Risk Interventions Readmission Risk Prevention Plan 02/27/2021 07/07/2019  Transportation Screening Complete Complete  PCP or Specialist Appt within 3-5 Days - Complete  [place]RI or [place] - Complete  Social Work Consult for [place] Planning/Counseling - Complete  Palliative Care Screening - Not Applicable  Medication Review Press photographer) Complete Complete  PCP or Specialist appointment within 3-5 days of discharge Complete -  [place] or Home Care Consult Complete -  SW Recovery Care/Counseling Consult Complete -  Palliative Care Screening Not Applicable -  [place] Not Applicable -  Some recent data might be hidden

## 2021-02-28 NOTE — Progress Notes (Signed)
D/C order noted. Contacted pt's clinic [facility] to see if they could work pt in on today's schedule since pt to receive HD today. Clinic is full with no availability. Pt missed his regular appt at 7:10 this am.   Melven Sartorius Renal Navigator [phone]

## 2021-02-28 NOTE — Progress Notes (Signed)
PT Cancellation Note  Patient Details Name: Daniel [name] MRN: [id number] DOB: July 15, 1960   Cancelled Treatment:    Reason Eval/Treat Not Completed: Patient declined, pt wanting to sleep. Will follow-up later today if schedule permits.   Brandon Melnick, SPT  Brandon Melnick 02/28/2021, 8:19 AM

## 2021-03-01 ENCOUNTER — Ambulatory Visit: Payer: Medicare Other | Admitting: Neurology

## 2021-03-01 ENCOUNTER — Encounter: Payer: Self-pay | Admitting: Neurology

## 2021-03-01 ENCOUNTER — Telehealth: Payer: Self-pay | Admitting: Nephrology

## 2021-03-01 ENCOUNTER — Telehealth: Payer: Self-pay

## 2021-03-01 NOTE — Telephone Encounter (Signed)
Transition Care Management Unsuccessful Follow-up Telephone Call  Date of discharge and from where:  [facility] 02/28/2021  Attempts:  1st Attempt  Reason for unsuccessful TCM follow-up call:  Left voice message unable to reach pt at this time//Pt has schedule HFU appt with PCP on 03/13/2021

## 2021-03-01 NOTE — Telephone Encounter (Signed)
Transition of care contact from inpatient facility  Date of Discharge: 02/28/21 Date of Contact: 03/01/21 Method of contact: Phone  Attempted to contact patient to discuss transition of care from inpatient admission. Patient did not answer the phone. Message was left on the patient's voicemail with call back number [phone].

## 2021-03-04 ENCOUNTER — Telehealth: Payer: Self-pay

## 2021-03-04 NOTE — Telephone Encounter (Signed)
Transition Care Management Unsuccessful Follow-up Telephone Call  Date of discharge and from where:  02/28/2021, [facility]   Attempts:  2nd Attempt  Reason for unsuccessful TCM follow-up call:  Left voice message on # [phone].  Call back requested to this CM.  Patient has appointment with [name], NP @ [facility] - 03/13/2021.

## 2021-03-05 ENCOUNTER — Telehealth: Payer: Self-pay

## 2021-03-05 NOTE — Telephone Encounter (Signed)
Transition Care Management Follow-up Telephone Call Date of discharge and from where: 02/28/2021, [facility]  How have you been since you were released from the hospital? He said he is doing okay Any questions or concerns? No  Items Reviewed: Did the pt receive and understand the discharge instructions provided? Yes  Medications obtained and verified? Yes - he said he has all medications and did not have any questions about his med regime. Other? No  Any new allergies since your discharge? No  Dietary orders reviewed? Yes Do you have support at home?  Lives alone  Home Care and Equipment/Supplies: Were home health services ordered? no If so, what is the name of the agency? N/a  Has the agency set up a time to come to the patient's home? not applicable Were any new equipment or medical supplies ordered?  No What is the name of the medical supply agency? N/a Were you able to get the supplies/equipment? not applicable Do you have any questions related to the use of the equipment or supplies? No  Attends dialysis: T/T/S at [place]: (I = Independent and D = Dependent) ADLs: independent. Has cane to use with ambulation if needed.   Follow up appointments reviewed:  PCP Hospital f/u appt confirmed? Yes  Scheduled to see [name], NP on 03/13/2021.  [facility] f/u appt confirmed? Yes  Scheduled to see GI 03/27/2021, was no show for neurology 03/01/2021.  Are transportation arrangements needed? No  If their condition worsens, is the pt aware to call PCP or go to the Emergency Dept.? Yes Was the patient provided with contact information for the PCP's office or ED? Yes Was to pt encouraged to call back with questions or concerns? Yes

## 2021-03-11 ENCOUNTER — Emergency Department (HOSPITAL_COMMUNITY)
Admission: EM | Admit: 2021-03-11 | Discharge: 2021-03-12 | Disposition: A | Discharge status: Home / Self Care | Payer: Medicare Other | Attending: Emergency Medicine | Admitting: Emergency Medicine

## 2021-03-11 ENCOUNTER — Encounter (HOSPITAL_COMMUNITY): Payer: Self-pay | Admitting: Emergency Medicine

## 2021-03-11 ENCOUNTER — Other Ambulatory Visit: Payer: Self-pay

## 2021-03-11 DIAGNOSIS — R195 Other fecal abnormalities: Secondary | ICD-10-CM | POA: Diagnosis present

## 2021-03-11 DIAGNOSIS — R109 Unspecified abdominal pain: Secondary | ICD-10-CM | POA: Diagnosis not present

## 2021-03-11 DIAGNOSIS — M7989 Other specified soft tissue disorders: Secondary | ICD-10-CM | POA: Diagnosis not present

## 2021-03-11 DIAGNOSIS — M79605 Pain in left leg: Secondary | ICD-10-CM | POA: Insufficient documentation

## 2021-03-11 DIAGNOSIS — Z5321 Procedure and treatment not carried out due to patient leaving prior to being seen by health care provider: Secondary | ICD-10-CM | POA: Diagnosis not present

## 2021-03-11 DIAGNOSIS — N186 End stage renal disease: Secondary | ICD-10-CM | POA: Insufficient documentation

## 2021-03-11 LAB — CBC
HCT: 26 % — ABNORMAL LOW (ref 39.0–52.0)
Hemoglobin: 8.1 g/dL — ABNORMAL LOW (ref 13.0–17.0)
MCH: 32.9 pg (ref 26.0–34.0)
MCHC: 31.2 g/dL (ref 30.0–36.0)
MCV: 105.7 fL — ABNORMAL HIGH (ref 80.0–100.0)
Platelets: 137 10*3/uL — ABNORMAL LOW (ref 150–400)
RBC: 2.46 MIL/uL — ABNORMAL LOW (ref 4.22–5.81)
RDW: 19.9 % — ABNORMAL HIGH (ref 11.5–15.5)
WBC: 5.7 10*3/uL (ref 4.0–10.5)
nRBC: 0.3 % — ABNORMAL HIGH (ref 0.0–0.2)

## 2021-03-11 LAB — COMPREHENSIVE METABOLIC PANEL
ALT: 38 U/L (ref 0–44)
AST: 97 U/L — ABNORMAL HIGH (ref 15–41)
Albumin: 2.6 g/dL — ABNORMAL LOW (ref 3.5–5.0)
Alkaline Phosphatase: 80 U/L (ref 38–126)
Anion gap: 15 (ref 5–15)
BUN: 27 mg/dL — ABNORMAL HIGH (ref 8–23)
CO2: 26 mmol/L (ref 22–32)
Calcium: 8.6 mg/dL — ABNORMAL LOW (ref 8.9–10.3)
Chloride: 97 mmol/L — ABNORMAL LOW (ref 98–111)
Creatinine, Ser: 10.06 mg/dL — ABNORMAL HIGH (ref 0.61–1.24)
GFR, Estimated: 5 mL/min — ABNORMAL LOW (ref >60)
Glucose, Bld: 99 mg/dL (ref 70–99)
Potassium: 3.6 mmol/L (ref 3.5–5.1)
Sodium: 138 mmol/L (ref 135–145)
Total Bilirubin: 1 mg/dL (ref 0.3–1.2)
Total Protein: 7.7 g/dL (ref 6.5–8.1)

## 2021-03-11 LAB — LIPASE, BLOOD: Lipase: 156 U/L — ABNORMAL HIGH (ref 11–51)

## 2021-03-11 NOTE — ED Provider Notes (Signed)
Emergency Medicine Provider Triage Evaluation Note  Daniel [name] , a 60 y.o. male  was evaluated in triage.  Pt complains of bright red blood in stool with abdominal pain x 1 day. Also complaining of left leg pain and swelling as well as diffuse skin lesions for several weeks. Dialysis patient Tues/Thurs/Sat, has not missed any treatments.   Review of Systems  Positive: Abdominal pain, blood in stool, shortness of breath, leg swelling Negative: Chest pain, rectal pain, nausea, vomiting  Physical Exam  BP (!) 150/84 (BP Location: Right Arm)   Pulse 95   Temp 98.8 F (37.1 C)   Resp 16   SpO2 100%  Gen:   Awake, no distress   Resp:  Normal effort  MSK:   Moves extremities without difficulty  Other:  Bilateral leg swelling, chronic appearing eczematoid skin lesions  Medical Decision Making  Medically screening exam initiated at 7:19 PM.  Appropriate orders placed.  Daniel [name] was informed that the remainder of the evaluation will be completed by another provider, this initial triage assessment does not replace that evaluation, and the importance of remaining in the ED until their evaluation is complete.     Daniel [name] 03/11/21 1921    Daniel Rasmussen, MD 03/11/21 [phone]

## 2021-03-11 NOTE — ED Triage Notes (Signed)
Patient states that he has had blood in his stools today, with abdominal pain, no nausea or vomiting.  Patient also states that his left leg is swollen today.

## 2021-03-12 ENCOUNTER — Emergency Department (HOSPITAL_COMMUNITY)
Admission: EM | Admit: 2021-03-12 | Discharge: 2021-03-13 | Disposition: A | Discharge status: Home / Self Care | Payer: Medicare Other | Attending: Emergency Medicine | Admitting: Emergency Medicine

## 2021-03-12 DIAGNOSIS — K5730 Diverticulosis of large intestine without perforation or abscess without bleeding: Secondary | ICD-10-CM | POA: Diagnosis not present

## 2021-03-12 DIAGNOSIS — N200 Calculus of kidney: Secondary | ICD-10-CM | POA: Diagnosis not present

## 2021-03-12 DIAGNOSIS — K921 Melena: Secondary | ICD-10-CM | POA: Diagnosis present

## 2021-03-12 DIAGNOSIS — K8020 Calculus of gallbladder without cholecystitis without obstruction: Secondary | ICD-10-CM | POA: Insufficient documentation

## 2021-03-12 DIAGNOSIS — I700 Atherosclerosis of aorta: Secondary | ICD-10-CM | POA: Insufficient documentation

## 2021-03-12 DIAGNOSIS — R109 Unspecified abdominal pain: Secondary | ICD-10-CM | POA: Insufficient documentation

## 2021-03-12 DIAGNOSIS — Z5321 Procedure and treatment not carried out due to patient leaving prior to being seen by health care provider: Secondary | ICD-10-CM | POA: Insufficient documentation

## 2021-03-12 DIAGNOSIS — R600 Localized edema: Secondary | ICD-10-CM | POA: Insufficient documentation

## 2021-03-12 DIAGNOSIS — K625 Hemorrhage of anus and rectum: Secondary | ICD-10-CM | POA: Diagnosis not present

## 2021-03-12 DIAGNOSIS — R2242 Localized swelling, mass and lump, left lower limb: Secondary | ICD-10-CM | POA: Diagnosis not present

## 2021-03-12 LAB — CBC
HCT: 25.1 % — ABNORMAL LOW (ref 39.0–52.0)
Hemoglobin: 7.8 g/dL — ABNORMAL LOW (ref 13.0–17.0)
MCH: 33.2 pg (ref 26.0–34.0)
MCHC: 31.1 g/dL (ref 30.0–36.0)
MCV: 106.8 fL — ABNORMAL HIGH (ref 80.0–100.0)
Platelets: 137 10*3/uL — ABNORMAL LOW (ref 150–400)
RBC: 2.35 MIL/uL — ABNORMAL LOW (ref 4.22–5.81)
RDW: 19.9 % — ABNORMAL HIGH (ref 11.5–15.5)
WBC: 5.3 10*3/uL (ref 4.0–10.5)
nRBC: 0 % (ref 0.0–0.2)

## 2021-03-12 LAB — COMPREHENSIVE METABOLIC PANEL
ALT: 34 U/L (ref 0–44)
AST: 90 U/L — ABNORMAL HIGH (ref 15–41)
Albumin: 2.5 g/dL — ABNORMAL LOW (ref 3.5–5.0)
Alkaline Phosphatase: 83 U/L (ref 38–126)
Anion gap: 16 — ABNORMAL HIGH (ref 5–15)
BUN: 33 mg/dL — ABNORMAL HIGH (ref 8–23)
CO2: 24 mmol/L (ref 22–32)
Calcium: 8.4 mg/dL — ABNORMAL LOW (ref 8.9–10.3)
Chloride: 100 mmol/L (ref 98–111)
Creatinine, Ser: 11.74 mg/dL — ABNORMAL HIGH (ref 0.61–1.24)
GFR, Estimated: 4 mL/min — ABNORMAL LOW (ref >60)
Glucose, Bld: 114 mg/dL — ABNORMAL HIGH (ref 70–99)
Potassium: 3.5 mmol/L (ref 3.5–5.1)
Sodium: 140 mmol/L (ref 135–145)
Total Bilirubin: 0.7 mg/dL (ref 0.3–1.2)
Total Protein: 7.7 g/dL (ref 6.5–8.1)

## 2021-03-12 LAB — TYPE AND SCREEN
ABO/RH(D): B POS
ABO/RH(D): B POS
Antibody Screen: NEGATIVE
Antibody Screen: NEGATIVE

## 2021-03-12 NOTE — ED Notes (Signed)
Pt was called x3 for vital update, no response will try again later.

## 2021-03-12 NOTE — ED Triage Notes (Signed)
Pt presents with blood in his stool since yesterday  Swelling to left leg.  Missed dialysis today.

## 2021-03-13 ENCOUNTER — Other Ambulatory Visit: Payer: Self-pay

## 2021-03-13 ENCOUNTER — Ambulatory Visit (INDEPENDENT_AMBULATORY_CARE_PROVIDER_SITE_OTHER): Payer: Medicare Other | Admitting: Primary Care

## 2021-03-13 ENCOUNTER — Emergency Department (HOSPITAL_COMMUNITY)
Admission: EM | Admit: 2021-03-13 | Discharge: 2021-03-13 | Disposition: A | Discharge status: Home / Self Care | Payer: Medicare Other | Source: Home / Self Care

## 2021-03-13 ENCOUNTER — Other Ambulatory Visit: Payer: Self-pay | Admitting: Internal Medicine

## 2021-03-13 ENCOUNTER — Encounter (INDEPENDENT_AMBULATORY_CARE_PROVIDER_SITE_OTHER): Payer: Self-pay | Admitting: Primary Care

## 2021-03-13 ENCOUNTER — Emergency Department (HOSPITAL_COMMUNITY): Payer: Medicare Other

## 2021-03-13 VITALS — BP 150/89 | HR 99 | Temp 97.2°F | Ht 73.0 in | Wt 288.8 lb

## 2021-03-13 DIAGNOSIS — S91309A Unspecified open wound, unspecified foot, initial encounter: Secondary | ICD-10-CM | POA: Insufficient documentation

## 2021-03-13 DIAGNOSIS — Z992 Dependence on renal dialysis: Secondary | ICD-10-CM

## 2021-03-13 DIAGNOSIS — N186 End stage renal disease: Secondary | ICD-10-CM | POA: Diagnosis not present

## 2021-03-13 DIAGNOSIS — K921 Melena: Secondary | ICD-10-CM | POA: Insufficient documentation

## 2021-03-13 DIAGNOSIS — X58XXXA Exposure to other specified factors, initial encounter: Secondary | ICD-10-CM | POA: Insufficient documentation

## 2021-03-13 DIAGNOSIS — A048 Other specified bacterial intestinal infections: Secondary | ICD-10-CM

## 2021-03-13 DIAGNOSIS — Z5321 Procedure and treatment not carried out due to patient leaving prior to being seen by health care provider: Secondary | ICD-10-CM | POA: Insufficient documentation

## 2021-03-13 DIAGNOSIS — Z09 Encounter for follow-up examination after completed treatment for conditions other than malignant neoplasm: Secondary | ICD-10-CM

## 2021-03-13 DIAGNOSIS — D500 Iron deficiency anemia secondary to blood loss (chronic): Secondary | ICD-10-CM | POA: Diagnosis not present

## 2021-03-13 DIAGNOSIS — I10 Essential (primary) hypertension: Secondary | ICD-10-CM | POA: Diagnosis not present

## 2021-03-13 DIAGNOSIS — K8020 Calculus of gallbladder without cholecystitis without obstruction: Secondary | ICD-10-CM | POA: Insufficient documentation

## 2021-03-13 NOTE — ED Provider Notes (Signed)
Emergency Medicine Provider Triage Evaluation Note  Daniel [name] , a 60 y.o. male  was evaluated in triage.  Pt complains of rectal bleeding and missed dialysis.  Review of Systems  Positive: Rectal bleeding, abdominal cramping Negative: Syncope, DOE  Physical Exam  BP (!) 161/90   Pulse (!) 101   Temp 97.9 F (36.6 C)   Resp 16   Ht 5\' 11"  (1.803 m)   Wt 117.9 kg   SpO2 99%   BMI 36.26 kg/m  Gen:   Awake, no distress   Resp:  Normal effort  MSK:   Moves extremities without difficulty  Other:    Medical Decision Making  Medically screening exam initiated at 6:03 AM.  Appropriate orders placed.  Daniel [name] was informed that the remainder of the evaluation will be completed by another provider, this initial triage assessment does not replace that evaluation, and the importance of remaining in the ED until their evaluation is complete.  Labs done within last 24 hours. CT ordered.    Merrily Pew, MD 03/13/21 [phone]

## 2021-03-13 NOTE — ED Notes (Signed)
Call pt.x4 noanswer  for recheck vitalsigns

## 2021-03-13 NOTE — ED Notes (Signed)
Called pt 3x no response °

## 2021-03-13 NOTE — ED Notes (Signed)
Pt not answering for vital recheck 2x

## 2021-03-13 NOTE — ED Triage Notes (Signed)
Pt reports blood in his stool for the past 2 days. Pt also c/o swollen feet and a wound on his foot that won't heal for 3 months.

## 2021-03-13 NOTE — ED Notes (Signed)
Pt not answering for vital recheck

## 2021-03-14 NOTE — Progress Notes (Signed)
Biopsies of patient's duodenal nodule did not reveal any dysplastic changes.  Can consider endoscopic ultrasound for further evaluation as outpatient.  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  Patient had no esophageal symptoms at time of endoscopy.  Patient will be following up with Dr. Havery Moros to discuss appropriateness of further endoscopic evaluation.

## 2021-03-17 ENCOUNTER — Emergency Department (HOSPITAL_COMMUNITY): Payer: Medicare Other

## 2021-03-17 ENCOUNTER — Encounter (HOSPITAL_COMMUNITY): Payer: Self-pay | Admitting: Emergency Medicine

## 2021-03-17 ENCOUNTER — Emergency Department (HOSPITAL_COMMUNITY)
Admission: EM | Admit: 2021-03-17 | Discharge: 2021-03-18 | Disposition: A | Discharge status: Home / Self Care | Payer: Medicare Other | Attending: Emergency Medicine | Admitting: Emergency Medicine

## 2021-03-17 DIAGNOSIS — R202 Paresthesia of skin: Secondary | ICD-10-CM | POA: Diagnosis not present

## 2021-03-17 DIAGNOSIS — D696 Thrombocytopenia, unspecified: Secondary | ICD-10-CM | POA: Insufficient documentation

## 2021-03-17 DIAGNOSIS — E1122 Type 2 diabetes mellitus with diabetic chronic kidney disease: Secondary | ICD-10-CM | POA: Insufficient documentation

## 2021-03-17 DIAGNOSIS — R0609 Other forms of dyspnea: Secondary | ICD-10-CM | POA: Insufficient documentation

## 2021-03-17 DIAGNOSIS — I120 Hypertensive chronic kidney disease with stage 5 chronic kidney disease or end stage renal disease: Secondary | ICD-10-CM | POA: Insufficient documentation

## 2021-03-17 DIAGNOSIS — D631 Anemia in chronic kidney disease: Secondary | ICD-10-CM | POA: Insufficient documentation

## 2021-03-17 DIAGNOSIS — M79671 Pain in right foot: Secondary | ICD-10-CM | POA: Insufficient documentation

## 2021-03-17 DIAGNOSIS — M79672 Pain in left foot: Secondary | ICD-10-CM | POA: Insufficient documentation

## 2021-03-17 DIAGNOSIS — Z79899 Other long term (current) drug therapy: Secondary | ICD-10-CM | POA: Insufficient documentation

## 2021-03-17 DIAGNOSIS — F17210 Nicotine dependence, cigarettes, uncomplicated: Secondary | ICD-10-CM | POA: Diagnosis not present

## 2021-03-17 DIAGNOSIS — Z85038 Personal history of other malignant neoplasm of large intestine: Secondary | ICD-10-CM | POA: Diagnosis not present

## 2021-03-17 DIAGNOSIS — Z992 Dependence on renal dialysis: Secondary | ICD-10-CM | POA: Diagnosis not present

## 2021-03-17 DIAGNOSIS — N186 End stage renal disease: Secondary | ICD-10-CM | POA: Insufficient documentation

## 2021-03-17 NOTE — ED Triage Notes (Signed)
Patient reports feet and lower legs swelling this week , denies injury/ambulatory , no SOB .

## 2021-03-17 NOTE — ED Provider Notes (Signed)
[place] EMERGENCY DEPARTMENT Provider Note   CSN: [id number] Arrival date & time: 03/17/21  2147     History Chief Complaint  Patient presents with   Lower legs/Feet Swelling    Daniel [name] is a 60 y.o. male with a hx of hypertension, diabetes mellitus, anemia, ESRD on dialysis T/Th/sat, ICH, & polysubstance use who presents to the ED with complaint of leg/foot pain/swelling. Patient reports he has been dealing with bilateral lower extremity swelling, L>R, for the past several weeks, this has persisted and has become painful over the past few days, especially in his feet, has some intermittent paresthesias/burning in the feet too. He notes some waxing/waning skin lesions to the extremities that have been present for months. Reports dyspnea x 1 week, constant, no alleviating/aggravating factors. Most recently dialyzed yesterday- only had a 1/2 dialysis session due to leaving because of his foot pain. No recent injuries. Denies fever, N/V, abdominal pain, chest pain, prior VTE, prior cancer, or hormone use.   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 Patient denies melena or hematochezia.   HPI     Past Medical History:  Diagnosis Date   Anemia    Diabetes mellitus without complication [facility])    patient denies   Dialysis patient [facility])    End stage chronic kidney disease ([place])    Hypertension    ICH (intracerebral hemorrhage) ([place]) 05/20/2017   Shoulder pain, left 06/28/2013    Patient Active Problem List   Diagnosis Date Noted   Acute blood loss anemia    Benign neoplasm of duodenum, jejunum, and ileum    Anemia due to chronic kidney disease 02/23/2021   Prolonged QT  interval 02/23/2021   Rotator cuff tear arthropathy of right shoulder 01/23/2021   Alcohol dependence ([place]) 01/13/2021   Gastric ulcer with hemorrhage 01/13/2021   Generalized weakness    Transaminitis 01/10/2021   Seizure-like activity ([place]) 10/27/2020   History of alcohol abuse 10/27/2020   History of cocaine abuse ([place]) 10/27/2020   Nicotine dependence, cigarettes, uncomplicated 26/94/8546   Acute GI bleeding 08/10/2020   Gastrointestinal hemorrhage with melena    Duodenal ulcer with hemorrhage    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   ESRD on hemodialysis ([place])    AVM (arteriovenous malformation) of small bowel, acquired with hemorrhage    Symptomatic anemia 07/31/2019   Acute on chronic anemia 07/23/2019   Chronic hepatitis C without hepatic coma ([place]) 07/11/2019   Iron deficiency anemia 03/30/2019   Alcohol use disorder 03/30/2019   B12 deficiency 03/30/2019   Orthostatic hypotension 01/22/2019   Essential hypertension 06/29/2016   DM2 (diabetes mellitus, type 2) ([place]) 06/29/2016    Past Surgical History:  Procedure Laterality Date   A/V FISTULAGRAM N/A 08/15/2020   Procedure: A/V FISTULAGRAM - Left Upper;  Surgeon: Cherre Robins, MD;  Location: [place] CV [facility];  Service: Cardiovascular;  Laterality: N/A;   APPENDECTOMY     AV FISTULA PLACEMENT Left 08/03/2019   Procedure: LEFT ARM ARTERIOVENOUS (AV) CEPHALIC  FISTULA CREATION;  Surgeon: Waynetta Sandy, MD;  Location: [facility];  Service: Vascular;  Laterality: Left;   BIOPSY  06/30/2019   Procedure: BIOPSY;  Surgeon: Ronald Lobo,  MD;  Location: [place];  Service: Endoscopy;;   BIOPSY  08/02/2019   Procedure: BIOPSY;  Surgeon: Yetta Flock, MD;  Location: [facility] ENDOSCOPY;  Service: Gastroenterology;;   BIOPSY  02/08/2021   Procedure: BIOPSY;  Surgeon: Sharyn Creamer, MD;  Location: [place];  Service: Gastroenterology;;   BIOPSY  02/24/2021   Procedure: BIOPSY;  Surgeon: [name], MD;  Location: [place];  Service: Gastroenterology;;   COLONOSCOPY  01/23/2012   Procedure: COLONOSCOPY;  Surgeon: Danie Binder, MD;  [facility];  Service: Endoscopy;  Laterality: N/A;  11:10 AM   COLONOSCOPY WITH PROPOFOL N/A 06/30/2019   Procedure: COLONOSCOPY WITH PROPOFOL;  Surgeon: Ronald Lobo, MD;  Location: [place];  Service: Endoscopy;  Laterality: N/A;   ENTEROSCOPY N/A 08/02/2019   Procedure: ENTEROSCOPY;  Surgeon: Yetta Flock, MD;  Location: [facility] ENDOSCOPY;  Service: Gastroenterology;  Laterality: N/A;   ENTEROSCOPY N/A 02/24/2021   Procedure: ENTEROSCOPY;  Surgeon: [name], MD;  Location: [facility] ENDOSCOPY;  Service: Gastroenterology;  Laterality: N/A;   ESOPHAGOGASTRODUODENOSCOPY N/A 08/10/2020   Procedure: ESOPHAGOGASTRODUODENOSCOPY (EGD);  Surgeon: Jerene Bears, MD;  Location: [facility] ENDOSCOPY;  Service: Gastroenterology;  Laterality: N/A;   ESOPHAGOGASTRODUODENOSCOPY (EGD) WITH PROPOFOL N/A 06/30/2019   Procedure: ESOPHAGOGASTRODUODENOSCOPY (EGD) WITH PROPOFOL;  Surgeon: Ronald Lobo, MD;  Location: [place];  Service: Endoscopy;  Laterality: N/A;   ESOPHAGOGASTRODUODENOSCOPY (EGD) WITH PROPOFOL N/A 01/12/2021   Procedure: ESOPHAGOGASTRODUODENOSCOPY (EGD) WITH PROPOFOL;  Surgeon: Sharyn Creamer, MD;  Location: [place];  Service: Gastroenterology;  Laterality: N/A;   ESOPHAGOGASTRODUODENOSCOPY (EGD) WITH PROPOFOL N/A 02/08/2021   Procedure: ESOPHAGOGASTRODUODENOSCOPY (EGD) WITH PROPOFOL;  Surgeon: Sharyn Creamer, MD;  Location: [place];  Service: Gastroenterology;  Laterality: N/A;   FISTULA SUPERFICIALIZATION Left 10/17/2019   Procedure: LEFT UPPER EXTREMITY FISTULA REVISION, SIDE BRANCH LIGATION,  AND SUPERFICIALIZATION;  Surgeon: Marty Heck, MD;  Location: [place];  Service: Vascular;  Laterality: Left;   GIVENS CAPSULE STUDY N/A 06/30/2019   Procedure: GIVENS CAPSULE STUDY;  Surgeon: Ronald Lobo, MD;  Location:  [place];  Service: Endoscopy;  Laterality: N/A;   HEMOSTASIS CLIP PLACEMENT  08/10/2020   Procedure: HEMOSTASIS CLIP PLACEMENT;  Surgeon: Jerene Bears, MD;  Location: [place];  Service: Gastroenterology;;   HEMOSTASIS CLIP PLACEMENT  01/12/2021   Procedure: HEMOSTASIS CLIP PLACEMENT;  Surgeon: Sharyn Creamer, MD;  Location: [place];  Service: Gastroenterology;;   HEMOSTASIS CONTROL  08/02/2019   Procedure: HEMOSTASIS CONTROL;  Surgeon: Yetta Flock, MD;  Location: [place];  Service: Gastroenterology;;   HOT HEMOSTASIS  02/24/2021   Procedure: HOT HEMOSTASIS (ARGON PLASMA COAGULATION/BICAP);  Surgeon: [name], MD;  Location: [facility] ENDOSCOPY;  Service: Gastroenterology;;   INCISION AND DRAINAGE ABSCESS N/A 06/29/2016   Procedure: INCISION AND DRAINAGE ABDOMINAL WALL ABSCESS;  Surgeon: Alphonsa Overall, MD;  Location: [facility];  Service: General;  Laterality: N/A;   INSERTION OF DIALYSIS CATHETER Right 08/03/2019   Procedure: INSERTION OF DIALYSIS CATHETER;  Surgeon: Waynetta Sandy, MD;  Location: [place];  Service: Vascular;  Laterality: Right;   INSERTION OF DIALYSIS CATHETER Right 10/22/2019   Procedure: INSERTION OF 23CM TUNNELED DIALYSIS CATHETER RIGHT INTERNAL JUGULAR;  Surgeon: Angelia Mould, MD;  Location: [place];  Service: Vascular;  Laterality: Right;   INSERTION OF DIALYSIS CATHETER Right 08/12/2020   Procedure: INSERTION OF Right internal Jugular TUNNELED  DIALYSIS CATHETER.;  Surgeon: Waynetta Sandy, MD;  Location: [place];  Service: Vascular;  Laterality: Right;   Left heel surgery     PENILE BIOPSY N/A 03/26/2020   Procedure: PENILE ULCER DEBRIDEMENT;  Surgeon: Remi Haggard, MD;  Location: [facility];  Service: Urology;  Laterality: N/A;  30 MINS   SCLEROTHERAPY  01/12/2021   Procedure: SCLEROTHERAPY;  Surgeon: Sharyn Creamer, MD;  Location: [facility] ENDOSCOPY;  Service: Gastroenterology;;   Clide Deutscher  02/24/2021   Procedure:  Clide Deutscher;  Surgeon: [name], MD;  Location[facility] ENDOSCOPY;  Service: Gastroenterology;;       Family History  Problem Relation Age of Onset   Colon cancer Neg Hx     Social History   Tobacco Use   Smoking status: Every Day    Packs/day: 1.00    Years: 30.00    Pack years: 30.00    Types: Cigarettes   Smokeless tobacco: Never  Vaping Use   Vaping Use: Never used  Substance Use Topics   Alcohol use: Not Currently    Alcohol/week: 12.0 standard drinks    Types: 12 Cans of beer per week   Drug use: Not Currently    Types: "Crack" cocaine    Comment: last in 2020    Home Medications Prior to Admission medications   Medication Sig Start Date End Date Taking? Authorizing Provider  amLODipine (NORVASC) 10 MG tablet Take 10 mg by mouth in the morning and at bedtime. 01/01/21   [provider]  blood glucose meter kit and supplies KIT Dispense based on patient and insurance preference. Use up to four times daily as directed. (FOR ICD-9 250.00, 250.01). 07/01/16   Nita Sells, MD  cloNIDine (CATAPRES) 0.2 MG tablet Take  1 tablet twice daily . Patient taking differently: Take 0.2 mg by mouth 2 (two) times daily. Take  1 tablet twice daily . 10/28/20   Donnamae Jude, MD  doxepin (SINEQUAN) 10 MG capsule Take 10 mg by mouth at bedtime. 01/03/21   [provider]  gabapentin (NEURONTIN) 300 MG capsule Take 300 mg by mouth in the morning, at noon, and at bedtime. 02/04/21   [provider]  hydrOXYzine (ATARAX/VISTARIL) 25 MG tablet TAKE 1 TABLET BY MOUTH EVERY 8 HOURS AS NEEDED FOR ANXIETY OR  ITCHING Patient taking differently: Take 25 mg by mouth in the morning and at bedtime. 11/07/20   [name], NP  lidocaine-prilocaine (EMLA) cream Apply 1 application topically once. 12/27/20   [provider]  pantoprazole (PROTONIX) 40 MG tablet Take 1 tablet (40 mg total) by mouth 2 (two) times daily. 02/28/21   Charlynne Cousins, MD   colchicine 0.6 MG tablet Take 0.5 tablets (0.3 mg total) by mouth 2 (two) times daily. 07/24/20 07/29/20  Noemi Chapel, MD  ferrous sulfate 325 (65 FE) MG tablet SMARTSIG:1 Tablet(s) By Mouth Twice Daily 08/11/19 02/09/20  [provider]  furosemide (LASIX) 40 MG tablet Take 1 tablet (40 mg total) by mouth daily. 07/25/19 02/09/20  Ladona Horns, MD    Allergies    Dilaudid [hydromorphone hcl]  Review of Systems   Review of Systems  Constitutional:  Negative for chills and fever.  Respiratory:  Positive for shortness of breath.   Cardiovascular:  Positive for leg swelling. Negative for chest pain.  Gastrointestinal:  Negative for abdominal pain, constipation, nausea and vomiting.  Musculoskeletal:  Positive for arthralgias and myalgias.  Skin:  Positive for rash.  Neurological:  Negative for syncope.  All other systems reviewed and are negative.  Physical Exam Updated Vital Signs  BP 128/77 (BP Location: Right Arm)   Pulse (!) 102   Temp 98.5 F (36.9 C) (Oral)   Resp (!) 22   Ht 5' 11"  (1.803 m)   Wt 120.2 kg   SpO2 100%   BMI 36.96 kg/m   Physical Exam Vitals and nursing note reviewed.  Constitutional:      General: He is not in acute distress.    Appearance: He is not toxic-appearing.  HENT:     Head: Normocephalic and atraumatic.  Cardiovascular:     Rate and Rhythm: Normal rate and regular rhythm.     Comments: DP/PT dopplerable pulses. Feet are warm to the touch with < 2 second capillary refill.  Pulmonary:     Effort: Pulmonary effort is normal. No respiratory distress.  Abdominal:     Palpations: Abdomen is soft.     Tenderness: There is no abdominal tenderness. There is no guarding or rebound.  Musculoskeletal:     Comments: LUE AV fistula with palpable thrill.  Les: Mildly asymmetric edema to bilateral lower legs- L>R. Able to actively range @ the knees, ankles and the digits. Tender throughout the feet and lower legs bilaterally. Compartments are  soft.   Skin:    Capillary Refill: Capillary refill takes less than 2 seconds.     Comments: Multiple somewhat skin lesions a few mm to cm in diameter to upper/lower extremities with some eschar, no significant open sores, no palm/sole involvement.  No fluctuance, no streaking erythema, no purulence.   Neurological:     Mental Status: He is alert.     Comments: Alert, clear speech, sensation grossly intact to light touch in bilateral lower extremities, but patient reports decreased at times bilaterally. 5/5 strength with plantar/dorsiflexion bilaterally.   Psychiatric:        Mood and Affect: Mood normal.    ED Results / Procedures / Treatments   Labs (all labs ordered are listed, but only abnormal results are displayed) Labs Reviewed  CBC WITH DIFFERENTIAL/PLATELET - Abnormal; Notable for the following components:      Result Value   RBC 2.16 (*)    Hemoglobin 7.3 (*)    HCT 23.0 (*)    MCV 106.5 (*)    RDW 19.4 (*)    Platelets 122 (*)    All other components within normal limits  COMPREHENSIVE METABOLIC PANEL - Abnormal; Notable for the following components:   Potassium 3.4 (*)    BUN 31 (*)    Creatinine, Ser 11.30 (*)    Calcium 8.4 (*)    Albumin 2.3 (*)    AST 55 (*)    GFR, Estimated 5 (*)    All other components within normal limits  PHOSPHORUS - Abnormal; Notable for the following components:   Phosphorus 5.8 (*)    All other components within normal limits  TROPONIN I (HIGH SENSITIVITY) - Abnormal; Notable for the following components:   Troponin I (High Sensitivity) 21 (*)    All other components within normal limits  TROPONIN I (HIGH SENSITIVITY) - Abnormal; Notable for the following components:   Troponin I (High Sensitivity) 19 (*)    All other components within normal limits    EKG EKG Interpretation  Date/Time:  Sunday March 17 2021 23:15:59 EST Ventricular Rate:  88 PR Interval:  183 QRS Duration: 97 QT Interval:  418 QTC Calculation: 506 R  Axis:   -16 Text Interpretation: Sinus rhythm Borderline left axis deviation Borderline low voltage, extremity leads Anteroseptal infarct,  old Prolonged QT interval Confirmed by Addison Lank [phone]) on 03/17/2021 11:47:41 PM  Radiology DG Chest Portable 1 View  Result Date: 03/17/2021 CLINICAL DATA:  Dyspnea EXAM: PORTABLE CHEST 1 VIEW COMPARISON:  02/23/2021 FINDINGS: Lungs are well expanded, symmetric, and clear. No pneumothorax or pleural effusion. Stable mild cardiomegaly. Pulmonary vascularity is normal. Osseous structures are age-appropriate. No acute bone abnormality. IMPRESSION: No active disease.  Stable cardiomegaly Electronically Signed   By: Fidela Salisbury M.D.   On: 03/17/2021 22:56    Procedures Procedures   Medications Ordered in ED Medications - No data to display  ED Course  I have reviewed the triage vital signs and the nursing notes.  Pertinent labs & imaging results that were available during my care of the patient were reviewed by me and considered in my medical decision making (see chart for details).    MDM Rules/Calculators/A&P                           Patient presents to the ED with complaints of foot/leg pain & swelling. Nontoxic, vitals w/ elevated BP.   Additional history obtained:  Additional history obtained from chart review & nursing note review.  - Recent admission for GI bleed- required ACP tx of duodenal AVM, multiple units of PRBCs.  - Most recent vascular surgery visit 09/18/2020- patient had complaints of foot pain/burning/swelling as well as rash at that time, ABIs performed @ that time- despite incompressible tibial vessels patient noted to have triphasic waveforms at the level of the ankle bilaterally, purchased compression stockings at that time per discussion with vascular provider. In terms of his lesions to the arms/legs there was consideration of calciphylaxis however there were no open sores requiring intensive wound care.   Recommendations was to not repeat ABIs given his incompressible tibial vessels however if he develops wounds on his feet in the future he will need to be considered for angiography  Lab Tests:  I Ordered, reviewed, and interpreted labs, which included:  CBC: Anemia & thrombocytopenia similar to prior.  CMP: consistent w/ ESRD. No significant hypercalcemia.  Phosphorus: minimal elevation Troponins: No significant delta elevation.   Imaging Studies ordered:  I ordered imaging studies which included CXR, I independently reviewed, formal radiology impression shows: No active disease.  ED Course:  Dyspnea- CXR fairly unremarkable, no hypoxia, flat troponins.  Foot & leg pain/swelling/burning w/ rash- per chart review this appears to be a chronic problem for the patient, seen by vascular surgery for same with ABIs, able to doppler pulses in the feet today with appropriate temperature to touch and cap refill. 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 Given chronicity and patient reports of the LLE typically being more swollen than the RLE chronically feel that a DVT is less likely.  Patient given analgesics with some improvement in pain. He overall appears reasonable for discharge home with follow up with PCP & vascular. We discussed results, treatment plan, need for follow-up, and return precautions with the patient. Provided opportunity for questions, patient confirmed understanding and is in agreement with plan.    This is a shared visit with supervising physician Dr. Leonette Monarch who has independently evaluated patient & provided guidance in evaluation/management/disposition, in  agreement with care   Portions of this note  were generated with Dragon dictation software. Dictation errors may occur despite best attempts at proofreading.  Final Clinical Impression(s) / ED Diagnoses Final diagnoses:  Pain in both feet    Rx / DC Orders ED Discharge Orders     None        Amaryllis Dyke, PA-C 03/18/21 [place], MD 03/18/21 [phone]

## 2021-03-18 DIAGNOSIS — M79671 Pain in right foot: Secondary | ICD-10-CM | POA: Diagnosis not present

## 2021-03-18 LAB — CBC WITH DIFFERENTIAL/PLATELET
Abs Immature Granulocytes: 0.02 10*3/uL (ref 0.00–0.07)
Basophils Absolute: 0 10*3/uL (ref 0.0–0.1)
Basophils Relative: 0 %
Eosinophils Absolute: 0.1 10*3/uL (ref 0.0–0.5)
Eosinophils Relative: 2 %
HCT: 23 % — ABNORMAL LOW (ref 39.0–52.0)
Hemoglobin: 7.3 g/dL — ABNORMAL LOW (ref 13.0–17.0)
Immature Granulocytes: 0 %
Lymphocytes Relative: 36 %
Lymphs Abs: 1.8 10*3/uL (ref 0.7–4.0)
MCH: 33.8 pg (ref 26.0–34.0)
MCHC: 31.7 g/dL (ref 30.0–36.0)
MCV: 106.5 fL — ABNORMAL HIGH (ref 80.0–100.0)
Monocytes Absolute: 0.6 10*3/uL (ref 0.1–1.0)
Monocytes Relative: 13 %
Neutro Abs: 2.4 10*3/uL (ref 1.7–7.7)
Neutrophils Relative %: 49 %
Platelets: 122 10*3/uL — ABNORMAL LOW (ref 150–400)
RBC: 2.16 MIL/uL — ABNORMAL LOW (ref 4.22–5.81)
RDW: 19.4 % — ABNORMAL HIGH (ref 11.5–15.5)
WBC: 5 10*3/uL (ref 4.0–10.5)
nRBC: 0 % (ref 0.0–0.2)

## 2021-03-18 LAB — COMPREHENSIVE METABOLIC PANEL
ALT: 22 U/L (ref 0–44)
AST: 55 U/L — ABNORMAL HIGH (ref 15–41)
Albumin: 2.3 g/dL — ABNORMAL LOW (ref 3.5–5.0)
Alkaline Phosphatase: 64 U/L (ref 38–126)
Anion gap: 15 (ref 5–15)
BUN: 31 mg/dL — ABNORMAL HIGH (ref 8–23)
CO2: 24 mmol/L (ref 22–32)
Calcium: 8.4 mg/dL — ABNORMAL LOW (ref 8.9–10.3)
Chloride: 104 mmol/L (ref 98–111)
Creatinine, Ser: 11.3 mg/dL — ABNORMAL HIGH (ref 0.61–1.24)
GFR, Estimated: 5 mL/min — ABNORMAL LOW (ref >60)
Glucose, Bld: 94 mg/dL (ref 70–99)
Potassium: 3.4 mmol/L — ABNORMAL LOW (ref 3.5–5.1)
Sodium: 143 mmol/L (ref 135–145)
Total Bilirubin: 0.8 mg/dL (ref 0.3–1.2)
Total Protein: 7.1 g/dL (ref 6.5–8.1)

## 2021-03-18 LAB — PHOSPHORUS: Phosphorus: 5.8 mg/dL — ABNORMAL HIGH (ref 2.5–4.6)

## 2021-03-18 LAB — TROPONIN I (HIGH SENSITIVITY)
Troponin I (High Sensitivity): 19 ng/L — ABNORMAL HIGH (ref <18)
Troponin I (High Sensitivity): 21 ng/L — ABNORMAL HIGH (ref <18)

## 2021-03-18 MED ORDER — OXYCODONE-ACETAMINOPHEN 5-325 MG PO TABS
1.0000 | ORAL_TABLET | Freq: Once | ORAL | Status: AC
  Administered 2021-03-18: 1 via ORAL
  Filled 2021-03-18: qty 1

## 2021-03-18 MED ORDER — FENTANYL CITRATE PF 50 MCG/ML IJ SOSY
50.0000 ug | PREFILLED_SYRINGE | Freq: Once | INTRAMUSCULAR | Status: AC
  Administered 2021-03-18: 50 ug via INTRAVENOUS
  Filled 2021-03-18: qty 1

## 2021-03-18 NOTE — Discharge Instructions (Signed)
You were seen in the ER today for foot pain.  Your labs were overall similar to prior labs you have had done, your phosphorus was mildly elevated- have this rechecked by primary care. Please follow up with your vascular surgeon and primary care as soon as possible. Go to your dialysis sessions as scheduled.   Return to the ER for new or worsening symptoms including but not limited to new or worsening pain, fever, chest pain, passing out, increased work of breathing, lesions and feet, discoloration of your feet or any other concerns.

## 2021-03-19 ENCOUNTER — Emergency Department (HOSPITAL_COMMUNITY)
Admission: EM | Admit: 2021-03-19 | Discharge: 2021-03-19 | Disposition: A | Discharge status: Home / Self Care | Payer: Medicare Other | Attending: Emergency Medicine | Admitting: Emergency Medicine

## 2021-03-19 ENCOUNTER — Other Ambulatory Visit: Payer: Self-pay

## 2021-03-19 ENCOUNTER — Encounter (HOSPITAL_COMMUNITY): Payer: Self-pay

## 2021-03-19 ENCOUNTER — Emergency Department (HOSPITAL_BASED_OUTPATIENT_CLINIC_OR_DEPARTMENT_OTHER): Payer: Medicare Other

## 2021-03-19 ENCOUNTER — Emergency Department (HOSPITAL_COMMUNITY): Payer: Medicare Other

## 2021-03-19 DIAGNOSIS — Z992 Dependence on renal dialysis: Secondary | ICD-10-CM | POA: Insufficient documentation

## 2021-03-19 DIAGNOSIS — I739 Peripheral vascular disease, unspecified: Secondary | ICD-10-CM | POA: Insufficient documentation

## 2021-03-19 DIAGNOSIS — M7989 Other specified soft tissue disorders: Secondary | ICD-10-CM | POA: Diagnosis not present

## 2021-03-19 DIAGNOSIS — M79671 Pain in right foot: Secondary | ICD-10-CM

## 2021-03-19 DIAGNOSIS — E1122 Type 2 diabetes mellitus with diabetic chronic kidney disease: Secondary | ICD-10-CM | POA: Diagnosis not present

## 2021-03-19 DIAGNOSIS — I120 Hypertensive chronic kidney disease with stage 5 chronic kidney disease or end stage renal disease: Secondary | ICD-10-CM | POA: Insufficient documentation

## 2021-03-19 DIAGNOSIS — F17210 Nicotine dependence, cigarettes, uncomplicated: Secondary | ICD-10-CM | POA: Diagnosis not present

## 2021-03-19 DIAGNOSIS — N186 End stage renal disease: Secondary | ICD-10-CM | POA: Insufficient documentation

## 2021-03-19 DIAGNOSIS — Z79899 Other long term (current) drug therapy: Secondary | ICD-10-CM | POA: Diagnosis not present

## 2021-03-19 DIAGNOSIS — M79672 Pain in left foot: Secondary | ICD-10-CM

## 2021-03-19 DIAGNOSIS — M79605 Pain in left leg: Secondary | ICD-10-CM | POA: Diagnosis present

## 2021-03-19 LAB — CBC WITH DIFFERENTIAL/PLATELET
Abs Immature Granulocytes: 0.02 10*3/uL (ref 0.00–0.07)
Basophils Absolute: 0 10*3/uL (ref 0.0–0.1)
Basophils Relative: 0 %
Eosinophils Absolute: 0.1 10*3/uL (ref 0.0–0.5)
Eosinophils Relative: 2 %
HCT: 22.5 % — ABNORMAL LOW (ref 39.0–52.0)
Hemoglobin: 7 g/dL — ABNORMAL LOW (ref 13.0–17.0)
Immature Granulocytes: 0 %
Lymphocytes Relative: 43 %
Lymphs Abs: 2 10*3/uL (ref 0.7–4.0)
MCH: 33.8 pg (ref 26.0–34.0)
MCHC: 31.1 g/dL (ref 30.0–36.0)
MCV: 108.7 fL — ABNORMAL HIGH (ref 80.0–100.0)
Monocytes Absolute: 0.7 10*3/uL (ref 0.1–1.0)
Monocytes Relative: 14 %
Neutro Abs: 2 10*3/uL (ref 1.7–7.7)
Neutrophils Relative %: 41 %
Platelets: 128 10*3/uL — ABNORMAL LOW (ref 150–400)
RBC: 2.07 MIL/uL — ABNORMAL LOW (ref 4.22–5.81)
RDW: 19.7 % — ABNORMAL HIGH (ref 11.5–15.5)
WBC: 4.8 10*3/uL (ref 4.0–10.5)
nRBC: 0 % (ref 0.0–0.2)

## 2021-03-19 LAB — BASIC METABOLIC PANEL
Anion gap: 12 (ref 5–15)
BUN: 45 mg/dL — ABNORMAL HIGH (ref 8–23)
CO2: 22 mmol/L (ref 22–32)
Calcium: 8.4 mg/dL — ABNORMAL LOW (ref 8.9–10.3)
Chloride: 114 mmol/L — ABNORMAL HIGH (ref 98–111)
Creatinine, Ser: 12.52 mg/dL — ABNORMAL HIGH (ref 0.61–1.24)
GFR, Estimated: 4 mL/min — ABNORMAL LOW (ref >60)
Glucose, Bld: 81 mg/dL (ref 70–99)
Potassium: 3.7 mmol/L (ref 3.5–5.1)
Sodium: 148 mmol/L — ABNORMAL HIGH (ref 135–145)

## 2021-03-19 MED ORDER — SODIUM CHLORIDE (PF) 0.9 % IJ SOLN
INTRAMUSCULAR | Status: AC
  Filled 2021-03-19: qty 50

## 2021-03-19 MED ORDER — IOHEXOL 350 MG/ML SOLN
80.0000 mL | Freq: Once | INTRAVENOUS | Status: AC | PRN
  Administered 2021-03-19: 80 mL via INTRAVENOUS

## 2021-03-19 MED ORDER — ACETAMINOPHEN 500 MG PO TABS
1000.0000 mg | ORAL_TABLET | Freq: Once | ORAL | Status: AC
  Administered 2021-03-19: 1000 mg via ORAL
  Filled 2021-03-19: qty 2

## 2021-03-19 MED ORDER — HYDROCODONE-ACETAMINOPHEN 5-325 MG PO TABS: 1.0000 | ORAL_TABLET | ORAL | 0 refills | Status: DC | PRN

## 2021-03-19 MED ORDER — GABAPENTIN 300 MG PO CAPS: 300.0000 mg | ORAL_CAPSULE | Freq: Two times a day (BID) | ORAL | 0 refills | Status: DC

## 2021-03-19 NOTE — ED Provider Notes (Signed)
[facility] DEPT Provider Note   CSN: [id number] Arrival date & time: 03/19/21  4801     History Chief Complaint  Patient presents with   Foot Swelling    Daniel [name] is a 60 y.o. male.  Pt presents to the ED today with leg pain and swelling.  Pt said he has had this pain for several months.  Today, he was supposed to go to dialysis, but did not go because his feet were hurting.  Pt has seen vascular for this problem.  It looks like they tried to do ABIs, but his arteries were not compressible.  They recommended cta as a next step.  Pt denies any f/c. He denies any f/c.  Last dialysis was on Saturday, 11/19.      Past Medical History:  Diagnosis Date   Anemia    Diabetes mellitus without complication [facility])    patient denies   Dialysis patient [facility])    End stage chronic kidney disease ([place])    Hypertension    ICH (intracerebral hemorrhage) ([place]) 05/20/2017   Shoulder pain, left 06/28/2013    Patient Active Problem List   Diagnosis Date Noted   Acute blood loss anemia    Benign neoplasm of duodenum, jejunum, and ileum    Anemia due to chronic kidney disease 02/23/2021   Prolonged QT interval 02/23/2021   Rotator cuff tear arthropathy of right shoulder 01/23/2021   Alcohol dependence ([place]) 01/13/2021   Gastric ulcer with hemorrhage 01/13/2021   Generalized weakness    Transaminitis 01/10/2021   Seizure-like activity ([place]) 10/27/2020   History of alcohol abuse 10/27/2020   History of cocaine abuse ([place]) 10/27/2020   Nicotine dependence, cigarettes, uncomplicated 65/53/7482   Acute GI bleeding 08/10/2020   Gastrointestinal hemorrhage with melena    Duodenal ulcer with hemorrhage    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   ESRD on hemodialysis ([place])    AVM (arteriovenous malformation) of small bowel, acquired with hemorrhage    Symptomatic anemia 07/31/2019   Acute on chronic anemia 07/23/2019   Chronic hepatitis C  without hepatic coma ([place]) 07/11/2019   Iron deficiency anemia 03/30/2019   Alcohol use disorder 03/30/2019   B12 deficiency 03/30/2019   Orthostatic hypotension 01/22/2019   Essential hypertension 06/29/2016   DM2 (diabetes mellitus, type 2) ([place]) 06/29/2016    Past Surgical History:  Procedure Laterality Date   A/V FISTULAGRAM N/A 08/15/2020   Procedure: A/V FISTULAGRAM - Left Upper;  Surgeon: Cherre Robins, MD;  Location: [facility];  Service: Cardiovascular;  Laterality: N/A;   APPENDECTOMY     AV FISTULA PLACEMENT Left 08/03/2019   Procedure: LEFT ARM ARTERIOVENOUS (AV) CEPHALIC  FISTULA CREATION;  Surgeon: Waynetta Sandy, MD;  Location: [place];  Service: Vascular;  Laterality: Left;   BIOPSY  06/30/2019   Procedure: BIOPSY;  Surgeon: Ronald Lobo, MD;  Location: [place];  Service: Endoscopy;;   BIOPSY  08/02/2019   Procedure: BIOPSY;  Surgeon: Yetta Flock, MD;  Location: [facility] END[facility]COPY;  Service: Gastroenterology;;   BIOPSY  02/08/2021   Procedure: BIOPSY;  Surgeon: Sharyn Creamer, MD;  Location: [place];  Service: Gastroenterology;;   BIOPSY  02/24/2021   Procedure: BIOPSY;  Surgeon: [name], MD;  Location: [place];  Service: Gastroenterology;;   COLONOSCOPY  01/23/2012   Procedure: COLONOSCOPY;  Surgeon: Danie Binder, MD;  [facility];  Service: Endoscopy;  Laterality: N/A;  11:10 AM  COLONOSCOPY WITH PROPOFOL N/A 06/30/2019   Procedure: COLONOSCOPY WITH PROPOFOL;  Surgeon: Ronald Lobo, MD;  Location: [place];  Service: Endoscopy;  Laterality: N/A;   ENTEROSCOPY N/A 08/02/2019   Procedure: ENTEROSCOPY;  Surgeon: Yetta Flock, MD;  Location: [facility];  Service: Gastroenterology;  Laterality: N/A;   ENTEROSCOPY N/A 02/24/2021   Procedure: ENTEROSCOPY;  Surgeon: [name], MD;  Location: [facility] ENDOSCOPY;  Service: Gastroenterology;  Laterality: N/A;   ESOPHAGOGASTRODUODENOSCOPY N/A 08/10/2020    Procedure: ESOPHAGOGASTRODUODENOSCOPY (EGD);  Surgeon: Jerene Bears, MD;  Location: [facility] ENDOSCOPY;  Service: Gastroenterology;  Laterality: N/A;   ESOPHAGOGASTRODUODENOSCOPY (EGD) WITH PROPOFOL N/A 06/30/2019   Procedure: ESOPHAGOGASTRODUODENOSCOPY (EGD) WITH PROPOFOL;  Surgeon: Ronald Lobo, MD;  Location: [place];  Service: Endoscopy;  Laterality: N/A;   ESOPHAGOGASTRODUODENOSCOPY (EGD) WITH PROPOFOL N/A 01/12/2021   Procedure: ESOPHAGOGASTRODUODENOSCOPY (EGD) WITH PROPOFOL;  Surgeon: Sharyn Creamer, MD;  Location: [place];  Service: Gastroenterology;  Laterality: N/A;   ESOPHAGOGASTRODUODENOSCOPY (EGD) WITH PROPOFOL N/A 02/08/2021   Procedure: ESOPHAGOGASTRODUODENOSCOPY (EGD) WITH PROPOFOL;  Surgeon: Sharyn Creamer, MD;  Location: [place];  Service: Gastroenterology;  Laterality: N/A;   FISTULA SUPERFICIALIZATION Left 10/17/2019   Procedure: LEFT UPPER EXTREMITY FISTULA REVISION, SIDE BRANCH LIGATION,  AND SUPERFICIALIZATION;  Surgeon: Marty Heck, MD;  Location: [place];  Service: Vascular;  Laterality: Left;   GIVENS CAPSULE STUDY N/A 06/30/2019   Procedure: GIVENS CAPSULE STUDY;  Surgeon: Ronald Lobo, MD;  Location: [place];  Service: Endoscopy;  Laterality: N/A;   HEMOSTASIS CLIP PLACEMENT  08/10/2020   Procedure: HEMOSTASIS CLIP PLACEMENT;  Surgeon: Jerene Bears, MD;  Location: [place];  Service: Gastroenterology;;   HEMOSTASIS CLIP PLACEMENT  01/12/2021   Procedure: HEMOSTASIS CLIP PLACEMENT;  Surgeon: Sharyn Creamer, MD;  Location: [place];  Service: Gastroenterology;;   HEMOSTASIS CONTROL  08/02/2019   Procedure: HEMOSTASIS CONTROL;  Surgeon: Yetta Flock, MD;  Location: [place];  Service: Gastroenterology;;   HOT HEMOSTASIS  02/24/2021   Procedure: HOT HEMOSTASIS (ARGON PLASMA COAGULATION/BICAP);  Surgeon: [name], MD;  Location: [facility] ENDOSCOPY;  Service: Gastroenterology;;   INCISION AND DRAINAGE ABSCESS N/A 06/29/2016    Procedure: INCISION AND DRAINAGE ABDOMINAL WALL ABSCESS;  Surgeon: Alphonsa Overall, MD;  Location: [facility];  Service: General;  Laterality: N/A;   INSERTION OF DIALYSIS CATHETER Right 08/03/2019   Procedure: INSERTION OF DIALYSIS CATHETER;  Surgeon: Waynetta Sandy, MD;  Location: [place];  Service: Vascular;  Laterality: Right;   INSERTION OF DIALYSIS CATHETER Right 10/22/2019   Procedure: INSERTION OF 23CM TUNNELED DIALYSIS CATHETER RIGHT INTERNAL JUGULAR;  Surgeon: Angelia Mould, MD;  Location: [place];  Service: Vascular;  Laterality: Right;   INSERTION OF DIALYSIS CATHETER Right 08/12/2020   Procedure: INSERTION OF Right internal Jugular TUNNELED  DIALYSIS CATHETER.;  Surgeon: Waynetta Sandy, MD;  Location: [place];  Service: Vascular;  Laterality: Right;   Left heel surgery     PENILE BIOPSY N/A 03/26/2020   Procedure: PENILE ULCER DEBRIDEMENT;  Surgeon: Remi Haggard, MD;  Location: [facility];  Service: Urology;  Laterality: N/A;  30 MINS   SCLEROTHERAPY  01/12/2021   Procedure: SCLEROTHERAPY;  Surgeon: Sharyn Creamer, MD;  Location: [facility] ENDOSCOPY;  Service: Gastroenterology;;   Clide Deutscher  02/24/2021   Procedure: Clide Deutscher;  Surgeon: [name], MD;  Location: [facility] ENDOSCOPY;  Service: Gastroenterology;;       Family History  Problem Relation Age of Onset   Colon cancer Neg Hx  Social History   Tobacco Use   Smoking status: Every Day    Packs/day: 1.00    Years: 30.00    Pack years: 30.00    Types: Cigarettes   Smokeless tobacco: Never  Vaping Use   Vaping Use: Never used  Substance Use Topics   Alcohol use: Not Currently    Alcohol/week: 12.0 standard drinks    Types: 12 Cans of beer per week   Drug use: Not Currently    Types: "Crack" cocaine    Comment: last in 2020    Home Medications Prior to Admission medications   Medication Sig Start Date End Date Taking? Authorizing Provider  amLODipine (NORVASC) 10 MG tablet Take 10 mg by  mouth at bedtime. 01/01/21  Yes [provider]  calcium acetate (PHOSLO) 667 MG capsule Take 1,334 mg by mouth 3 (three) times daily with meals. 03/14/21  Yes [provider]  cloNIDine (CATAPRES) 0.1 MG tablet Take 0.1 mg by mouth 2 (two) times daily. 03/13/21  Yes [provider]  doxepin (SINEQUAN) 10 MG capsule Take 10 mg by mouth at bedtime. 01/03/21  Yes [provider]  HYDROcodone-acetaminophen (NORCO/VICODIN) 5-325 MG tablet Take 1 tablet by mouth every 4 (four) hours as needed. 03/19/21  Yes Isla Pence, MD  hydrOXYzine (ATARAX/VISTARIL) 25 MG tablet TAKE 1 TABLET BY MOUTH EVERY 8 HOURS AS NEEDED FOR ANXIETY OR  ITCHING Patient taking differently: Take 25 mg by mouth in the morning and at bedtime. 11/07/20  Yes [name], NP  lidocaine-prilocaine (EMLA) cream Apply 1 application topically once. 12/27/20  Yes [provider]  pantoprazole (PROTONIX) 40 MG tablet Take 1 tablet (40 mg total) by mouth 2 (two) times daily. 02/28/21  Yes Charlynne Cousins, MD  blood glucose meter kit and supplies KIT Dispense based on patient and insurance preference. Use up to four times daily as directed. (FOR ICD-9 250.00, 250.01). 07/01/16   Nita Sells, MD  cloNIDine (CATAPRES) 0.2 MG tablet Take  1 tablet twice daily . Patient not taking: Reported on 03/19/2021 10/28/20   Donnamae Jude, MD  gabapentin (NEURONTIN) 300 MG capsule Take 1 capsule (300 mg total) by mouth 2 (two) times daily. 03/19/21   Isla Pence, MD  colchicine 0.6 MG tablet Take 0.5 tablets (0.3 mg total) by mouth 2 (two) times daily. 07/24/20 07/29/20  Noemi Chapel, MD  ferrous sulfate 325 (65 FE) MG tablet SMARTSIG:1 Tablet(s) By Mouth Twice Daily 08/11/19 02/09/20  [provider]  furosemide (LASIX) 40 MG tablet Take 1 tablet (40 mg total) by mouth daily. 07/25/19 02/09/20  Ladona Horns, MD    Allergies    Dilaudid [hydromorphone hcl]  Review of Systems   Review  of Systems  Musculoskeletal:        Bilateral feet pain  All other systems reviewed and are negative.  Physical Exam Updated Vital Signs BP (!) 176/96   Pulse 85   Temp 97.9 F (36.6 C) (Oral)   Resp 18   Ht 5' 11"  (1.803 m)   Wt 120.2 kg   SpO2 97%   BMI 36.96 kg/m   Physical Exam Vitals and nursing note reviewed.  Constitutional:      Appearance: Normal appearance.  HENT:     Head: Normocephalic and atraumatic.     Right Ear: External ear normal.     Left Ear: External ear normal.     Nose: Nose normal.     Mouth/Throat:     Mouth: Mucous  membranes are moist.     Pharynx: Oropharynx is clear.  Eyes:     Extraocular Movements: Extraocular movements intact.     Conjunctiva/sclera: Conjunctivae normal.     Pupils: Pupils are equal, round, and reactive to light.  Cardiovascular:     Rate and Rhythm: Normal rate and regular rhythm.     Pulses: Normal pulses.     Heart sounds: Normal heart sounds.  Pulmonary:     Effort: Pulmonary effort is normal.     Breath sounds: Normal breath sounds.  Abdominal:     General: Abdomen is flat. Bowel sounds are normal.     Palpations: Abdomen is soft.  Musculoskeletal:     Cervical back: Normal range of motion and neck supple.     Comments: + thrill LUE Left greater than right leg swelling.  Tenderness to palpation.  Skin:    General: Skin is warm.     Capillary Refill: Capillary refill takes less than 2 seconds.     Comments: Several lesions to his feet.  Chronic.  Neurological:     General: No focal deficit present.     Mental Status: He is alert and oriented to person, place, and time.  Psychiatric:        Mood and Affect: Mood normal.        Behavior: Behavior normal.    ED Results / Procedures / Treatments   Labs (all labs ordered are listed, but only abnormal results are displayed) Labs Reviewed  BASIC METABOLIC PANEL - Abnormal; Notable for the following components:      Result Value   Sodium 148 (*)    Chloride  114 (*)    BUN 45 (*)    Creatinine, Ser 12.52 (*)    Calcium 8.4 (*)    GFR, Estimated 4 (*)    All other components within normal limits  CBC WITH DIFFERENTIAL/PLATELET - Abnormal; Notable for the following components:   RBC 2.07 (*)    Hemoglobin 7.0 (*)    HCT 22.5 (*)    MCV 108.7 (*)    RDW 19.7 (*)    Platelets 128 (*)    All other components within normal limits    EKG None  Radiology CT Angio Aortobifemoral W and/or Wo Contrast  Result Date: 03/19/2021 CLINICAL DATA:  Bilateral foot swelling, arterial embolism EXAM: CT ANGIOGRAPHY OF ABDOMINAL AORTA WITH ILIOFEMORAL RUNOFF TECHNIQUE: Multidetector CT imaging of the abdomen, pelvis and lower extremities was performed using the standard protocol during bolus administration of intravenous contrast. Multiplanar CT image reconstructions and MIPs were obtained to evaluate the vascular anatomy. CONTRAST:  66m OMNIPAQUE IOHEXOL 350 MG/ML SOLN COMPARISON:  03/13/2021 FINDINGS: VASCULAR Aorta: Moderate calcified atheromatous plaque. No aneurysm, dissection, or stenosis. Celiac: Patent without evidence of aneurysm, dissection, vasculitis or significant stenosis. SMA: Scattered calcified plaque. No aneurysm, dissection, or stenosis. Classic distal branch anatomy. Renals: Single bilaterally, both with calcified ostial plaque but no high-grade stenosis. IMA: Patent without evidence of aneurysm, dissection, vasculitis or significant stenosis. RIGHT Lower Extremity Inflow: Common iliac atheromatous, patent Internal iliac atheromatous, patent External iliac calcified plaque with short-segment stenosis in its midportion of possible hemodynamic significance Outflow: Common femoral heavily atheromatous, patent Deep femoral branches atheromatous, patent SFA extensive calcified plaque with calcified stenosis in its midportion over short segment, and tandem regions of at least mild stenosis in its distal half. There is poor contrast opacification limiting  accurate assessment of stenosis severity. Popliteal scattered calcified plaque with tandem stenosis of  at least mild severity, limited assessment due to poor contrast enhancement Runoff: Heavily calcification throughout the trifurcation vessels. Limited contrast enhancement further stymies evaluation of patency. LEFT Lower Extremity Inflow: Moderate calcified plaque throughout the iliac arterial system without high-grade stenosis or aneurysm. Outflow: Common femoral  atheromatous, patent Deep femoral branches atheromatous, patent SFA extensive calcified plaque with calcified tandem mid and distal stenoses of at least moderate severity. There is poor contrast opacification limiting accurate assessment of stenosis severity. Popliteal extensive calcified plaque with tandem stenosis of at least mild severity, limited assessment due to poor contrast enhancement Runoff: Heavily calcification throughout the trifurcation vessels. Limited contrast enhancement further stymies evaluation of patency. Veins: No obvious venous abnormality within the limitations of this arterial phase study. Review of the MIP images confirms the above findings. NON-VASCULAR Lower chest: No pleural or pericardial effusion. Coronary calcifications. Hepatobiliary: 9 mm calcified gallstone in the neck of the nondistended gallbladder. No biliary ductal dilatation or focal liver lesion. Pancreas: Unremarkable. No pancreatic ductal dilatation or surrounding inflammatory changes. Spleen: Normal in size without focal abnormality. Adrenals/Urinary Tract: Adrenal glands unremarkable. 3 mm calcification in the lower pole left renal collecting system. No hydronephrosis. Urinary bladder incompletely distended. Stomach/Bowel: Stomach is incompletely distended, unremarkable. Small bowel decompressed. The colon is nondilated, unremarkable. Lymphatic: No abdominal or pelvic adenopathy. Reproductive: Prostate is unremarkable. Other: Left pelvic phleboliths.  No  ascites.  No free air. Musculoskeletal: No fracture or worrisome bone lesion. Bone infarcts in the distal right femoral shaft and proximal left tibial shaft. Orthopedic hardware affixes the left calcaneus. IMPRESSION: 1. Short-segment stenosis in the mid right external iliac artery of possible hemodynamic significance. 2. Tandem stenoses in bilateral superficial femoral arteries of possible hemodynamic significance. 3. Heavy arterial calcification in bilateral popliteal arteries and tibial runoff, with limited contrast opacification limiting assessment of patency. MRA runoff may provide more accurate assessment of these segments. 4. Cholelithiasis. 5. Nonobstructive left nephrolithiasis. Electronically Signed   By: Lucrezia Europe M.D.   On: 03/19/2021 09:50   DG Chest Portable 1 View  Result Date: 03/17/2021 CLINICAL DATA:  Dyspnea EXAM: PORTABLE CHEST 1 VIEW COMPARISON:  02/23/2021 FINDINGS: Lungs are well expanded, symmetric, and clear. No pneumothorax or pleural effusion. Stable mild cardiomegaly. Pulmonary vascularity is normal. Osseous structures are age-appropriate. No acute bone abnormality. IMPRESSION: No active disease.  Stable cardiomegaly Electronically Signed   By: Fidela [place] M.D.   On: 03/17/2021 22:56    Procedures Procedures   Medications Ordered in ED Medications  acetaminophen (TYLENOL) tablet 1,000 mg (1,000 mg Oral Given 03/19/21 0716)  iohexol (OMNIPAQUE) 350 MG/ML injection 80 mL (80 mLs Intravenous Contrast Given 03/19/21 0819)  sodium chloride (PF) 0.9 % injection (  Given by Other 03/19/21 6761)    ED Course  I have reviewed the triage vital signs and the nursing notes.  Pertinent labs & imaging results that were available during my care of the patient were reviewed by me and considered in my medical decision making (see chart for details).    MDM Rules/Calculators/A&P                           Pt has no evidence of DVT on [place].  He has significant PVD, but no acute  occlusions.  He has weak pulses in both feet.  He may also have some neuropathy.  I have recommended f/u with vascular.  I also increased his neurontin.  He is to call his dialysis  center to rearrange his dialysis.  He is to return if worse. Final Clinical Impression(s) / ED Diagnoses Final diagnoses:  PVD (peripheral vascular disease) ([place])  ESRD on hemodialysis ([place])    Rx / DC Orders ED Discharge Orders          Ordered    gabapentin (NEURONTIN) 300 MG capsule  2 times daily        03/19/21 1011    HYDROcodone-acetaminophen (NORCO/VICODIN) 5-325 MG tablet  Every 4 hours PRN        03/19/21 1011             Isla Pence, MD 03/19/21 1015

## 2021-03-19 NOTE — Progress Notes (Signed)
BLE venous duplex has been completed.  Preliminary results given to Dr. Gilford Raid via secure chat.   Results can be found under chart review under CV PROC. 03/19/2021 11:04 AM Marjorie Deprey RVT, RDMS

## 2021-03-19 NOTE — ED Triage Notes (Addendum)
Pt reports with bilateral foot swelling and states that he missed his dialysis appointment today because his feet hurt. Pt states that he has a sore behind his right leg. Pt states that "they don't help him do nothing".

## 2021-03-23 MED ORDER — AMLODIPINE BESYLATE 10 MG PO TABS: 10.0000 mg | ORAL_TABLET | Freq: Every day | ORAL | 0 refills | Status: DC

## 2021-03-23 NOTE — Progress Notes (Signed)
Daniel [name] is a 60 y.o. male presents for hypertension evaluation, Denies shortness of breath, headaches, chest pain or lower extremity edema, sudden onset, vision changes, unilateral weakness, dizziness, paresthesias.  Presented to the emergency room on 11/16 with rectal bleeding, abdominal cramping.  Patient left the emergency room before evaluation.  Patient's blood pressure is elevated today did not take medication and reviewing history of medication refills noncompliant.  Patient also has renal failure and on dialysis also complicating managing blood pressure  Patient denies adherence with medications.  Dietary habits include: Conscientious of what he is trying to eat Exercise habits include: Yes walking  Family / Social history: No    Past Medical History:  Diagnosis Date   Anemia    Diabetes mellitus without complication ([place])    patient denies   Dialysis patient [facility])    End stage chronic kidney disease ([place])    Hypertension    ICH (intracerebral hemorrhage) ([place]) 05/20/2017   Shoulder pain, left 06/28/2013   Past Surgical History:  Procedure Laterality Date   A/V FISTULAGRAM N/A 08/15/2020   Procedure: A/V FISTULAGRAM - Left Upper;  Surgeon: Cherre Robins, MD;  Location: [place] [facility];  Service: Cardiovascular;  Laterality: N/A;   APPENDECTOMY     AV FISTULA PLACEMENT Left 08/03/2019   Procedure: LEFT ARM ARTERIOVENOUS (AV) CEPHALIC  FISTULA CREATION;  Surgeon: Waynetta Sandy, MD;  Location: [place];  Service: Vascular;  Laterality: Left;   BIOPSY  06/30/2019   Procedure: BIOPSY;  Surgeon: Ronald Lobo, MD;  Location: [place];  Service: Endoscopy;;   BIOPSY  08/02/2019   Procedure: BIOPSY;  Surgeon: Yetta Flock, MD;  Location: [facility] ENDOSCOPY;  Service: Gastroenterology;;   BIOPSY  02/08/2021   Procedure: BIOPSY;  Surgeon: Sharyn Creamer, MD;  Location: [place];  Service: Gastroenterology;;   BIOPSY   02/24/2021   Procedure: BIOPSY;  Surgeon: [name], MD;  Location: [place];  Service: Gastroenterology;;   COLONOSCOPY  01/23/2012   Procedure: COLONOSCOPY;  Surgeon: Danie Binder, MD;  [facility];  Service: Endoscopy;  Laterality: N/A;  11:10 AM   COLONOSCOPY WITH PROPOFOL N/A 06/30/2019   Procedure: COLONOSCOPY WITH PROPOFOL;  Surgeon: Ronald Lobo, MD;  Location: [place];  Service: Endoscopy;  Laterality: N/A;   ENTEROSCOPY N/A 08/02/2019   Procedure: ENTEROSCOPY;  Surgeon: Yetta Flock, MD;  Location: [facility] ENDOSCOPY;  Service: Gastroenterology;  Laterality: N/A;   ENTEROSCOPY N/A 02/24/2021   Procedure: ENTEROSCOPY;  Surgeon: [name], MD;  Location: [facility] ENDOSCOPY;  Service: Gastroenterology;  Laterality: N/A;   ESOPHAGOGASTRODUODENOSCOPY N/A 08/10/2020   Procedure: ESOPHAGOGASTRODUODENOSCOPY (EGD);  Surgeon: Jerene Bears, MD;  Location: [facility] ENDOSCOPY;  Service: Gastroenterology;  Laterality: N/A;   ESOPHAGOGASTRODUODENOSCOPY (EGD) WITH PROPOFOL N/A 06/30/2019   Procedure: ESOPHAGOGASTRODUODENOSCOPY (EGD) WITH PROPOFOL;  Surgeon: Ronald Lobo, MD;  Location: [place];  Service: Endoscopy;  Laterality: N/A;   ESOPHAGOGASTRODUODENOSCOPY (EGD) WITH PROPOFOL N/A 01/12/2021   Procedure: ESOPHAGOGASTRODUODENOSCOPY (EGD) WITH PROPOFOL;  Surgeon: Sharyn Creamer, MD;  Location: [place];  Service: Gastroenterology;  Laterality: N/A;   ESOPHAGOGASTRODUODENOSCOPY (EGD) WITH PROPOFOL N/A 02/08/2021   Procedure: ESOPHAGOGASTRODUODENOSCOPY (EGD) WITH PROPOFOL;  Surgeon: Sharyn Creamer, MD;  Location: [place];  Service: Gastroenterology;  Laterality: N/A;   FISTULA SUPERFICIALIZATION Left 10/17/2019   Procedure: LEFT UPPER EXTREMITY FISTULA REVISION, SIDE BRANCH LIGATION,  AND SUPERFICIALIZATION;  Surgeon: Marty Heck, MD;  Location: [place];  Service:  Vascular;  Laterality: Left;   GIVENS CAPSULE STUDY N/A 06/30/2019   Procedure: GIVENS  CAPSULE STUDY;  Surgeon: Ronald Lobo, MD;  Location: [place];  Service: Endoscopy;  Laterality: N/A;   HEMOSTASIS CLIP PLACEMENT  08/10/2020   Procedure: HEMOSTASIS CLIP PLACEMENT;  Surgeon: Jerene Bears, MD;  Location: [place];  Service: Gastroenterology;;   HEMOSTASIS CLIP PLACEMENT  01/12/2021   Procedure: HEMOSTASIS CLIP PLACEMENT;  Surgeon: Sharyn Creamer, MD;  Location: [place];  Service: Gastroenterology;;   HEMOSTASIS CONTROL  08/02/2019   Procedure: HEMOSTASIS CONTROL;  Surgeon: Yetta Flock, MD;  Location: [place];  Service: Gastroenterology;;   HOT HEMOSTASIS  02/24/2021   Procedure: HOT HEMOSTASIS (ARGON PLASMA COAGULATION/BICAP);  Surgeon: [name], MD;  Location: [facility] ENDOSCOPY;  Service: Gastroenterology;;   INCISION AND DRAINAGE ABSCESS N/A 06/29/2016   Procedure: INCISION AND DRAINAGE ABDOMINAL WALL ABSCESS;  Surgeon: Alphonsa Overall, MD;  Location: [facility];  Service: General;  Laterality: N/A;   INSERTION OF DIALYSIS CATHETER Right 08/03/2019   Procedure: INSERTION OF DIALYSIS CATHETER;  Surgeon: Waynetta Sandy, MD;  Location: [place];  Service: Vascular;  Laterality: Right;   INSERTION OF DIALYSIS CATHETER Right 10/22/2019   Procedure: INSERTION OF 23CM TUNNELED DIALYSIS CATHETER RIGHT INTERNAL JUGULAR;  Surgeon: Angelia Mould, MD;  Location: [place];  Service: Vascular;  Laterality: Right;   INSERTION OF DIALYSIS CATHETER Right 08/12/2020   Procedure: INSERTION OF Right internal Jugular TUNNELED  DIALYSIS CATHETER.;  Surgeon: Waynetta Sandy, MD;  Location: [place];  Service: Vascular;  Laterality: Right;   Left heel surgery     PENILE BIOPSY N/A 03/26/2020   Procedure: PENILE ULCER DEBRIDEMENT;  Surgeon: Remi Haggard, MD;  Location: [facility];  Service: Urology;  Laterality: N/A;  30 MINS   SCLEROTHERAPY  01/12/2021   Procedure: SCLEROTHERAPY;  Surgeon: Sharyn Creamer, MD;  Location: [facility] ENDOSCOPY;  Service:  Gastroenterology;;   Clide Deutscher  02/24/2021   Procedure: Clide Deutscher;  Surgeon: [name], MD;  Location: [facility] ENDOSCOPY;  Service: Gastroenterology;;   Allergies  Allergen Reactions   Dilaudid [Hydromorphone Hcl] Itching and Other (See Comments)    Pt reports itchiness after IM injection    Current Outpatient Medications on File Prior to Visit  Medication Sig Dispense Refill   amLODipine (NORVASC) 10 MG tablet Take 10 mg by mouth at bedtime.     blood glucose meter kit and supplies KIT Dispense based on patient and insurance preference. Use up to four times daily as directed. (FOR ICD-9 250.00, 250.01). 1 each 0   cloNIDine (CATAPRES) 0.2 MG tablet Take  1 tablet twice daily . (Patient not taking: Reported on 03/19/2021) 180 tablet 1   doxepin (SINEQUAN) 10 MG capsule Take 10 mg by mouth at bedtime.     hydrOXYzine (ATARAX/VISTARIL) 25 MG tablet TAKE 1 TABLET BY MOUTH EVERY 8 HOURS AS NEEDED FOR ANXIETY OR  ITCHING (Patient taking differently: Take 25 mg by mouth in the morning and at bedtime.) 90 tablet 0   lidocaine-prilocaine (EMLA) cream Apply 1 application topically once.     pantoprazole (PROTONIX) 40 MG tablet Take 1 tablet (40 mg total) by mouth 2 (two) times daily. 60 tablet 2   [DISCONTINUED] colchicine 0.6 MG tablet Take 0.5 tablets (0.3 mg total) by mouth 2 (two) times daily. 14 tablet 0   [DISCONTINUED] ferrous sulfate 325 (65 FE) MG tablet SMARTSIG:1 Tablet(s) By Mouth Twice Daily     [DISCONTINUED] furosemide (LASIX) 40  MG tablet Take 1 tablet (40 mg total) by mouth daily. 30 tablet 0   No current facility-administered medications on file prior to visit.   Social History   Socioeconomic History   Marital status: Single    Spouse name: Not on file   Number of children: Not on file   Years of education: Not on file   Highest education level: Not on file  Occupational History   Not on file  Tobacco Use   Smoking status: Every Day    Packs/day: 1.00     Years: 30.00    Pack years: 30.00    Types: Cigarettes   Smokeless tobacco: Never  Vaping Use   Vaping Use: Never used  Substance and Sexual Activity   Alcohol use: Not Currently    Alcohol/week: 12.0 standard drinks    Types: 12 Cans of beer per week   Drug use: Not Currently    Types: "Crack" cocaine    Comment: last in 2020   Sexual activity: Yes    Birth control/protection: None  Other Topics Concern   Not on file  Social History Narrative   Not on file   Social Determinants of Health   Financial Resource Strain: Not on file  Food Insecurity: Not on file  Transportation Needs: Not on file  Physical Activity: Not on file  Stress: Not on file  Social Connections: Not on file  Intimate Partner Violence: Not on file   Family History  Problem Relation Age of Onset   Colon cancer Neg Hx      OBJECTIVE:  Vitals:   03/13/21 1346  BP: (!) 150/89  Pulse: 99  Temp: (!) 97.2 F (36.2 C)  TempSrc: Temporal  SpO2: (!) 77%  Weight: 288 lb 12.8 oz (131 kg)  Height: 6' 1"  (1.854 m)    Physical Exam . Vitals:   03/13/21 1346  BP: (!) 150/89  Pulse: 99  Temp: (!) 97.2 F (36.2 C)  TempSrc: Temporal  SpO2: (!) 77%  Weight: 288 lb 12.8 oz (131 kg)  Height: 6' 1"  (1.854 m)   General: Vital signs reviewed.  Patient is well-developed and well-nourished, in no acute distress and cooperative with exam.  Head: Normocephalic and atraumatic. Eyes: EOMI, conjunctivae normal, no scleral icterus.  Neck: Supple, trachea midline, normal ROM, no JVD, masses, thyromegaly, or carotid bruit present.  Cardiovascular: RRR, S1 normal, S2 normal, no murmurs, gallops, or rubs. Pulmonary/Chest: Clear to auscultation bilaterally, no wheezes, rales, or rhonchi. Abdominal: Soft, non-tender, non-distended, BS +, no masses, organomegaly, or guarding present.  Musculoskeletal: No joint deformities, erythema, or stiffness, ROM full and nontender. Extremities: No lower extremity edema  bilaterally,  pulses symmetric and intact bilaterally. No cyanosis or clubbing. Neurological: A&O x3,  motor deficit, sensory intact to light touch bilaterally.  Skin: Warm, dry and intact. No rashes or erythema. Psychiatric: Normal mood and affect. speech and behavior is normal. Cognition and memory are normal.    ROS Comprehensive review of systems noted in HPI positives and negatives Last 3 Office BP readings: BP Readings from Last 3 Encounters:  03/19/21 (!) 160/90  03/18/21 (!) 142/81  03/13/21 (!) 150/89    BMET    Component Value Date/Time   NA 148 (H) 03/19/2021 0702   NA 138 08/22/2020 1141   K 3.7 03/19/2021 0702   CL 114 (H) 03/19/2021 0702   CO2 22 03/19/2021 0702   GLUCOSE 81 03/19/2021 0702   BUN 45 (H) 03/19/2021 0702   BUN  20 08/22/2020 1141   CREATININE 12.52 (H) 03/19/2021 0702   CALCIUM 8.4 (L) 03/19/2021 0702   CALCIUM 7.9 (L) 08/01/2019 0702   GFRNONAA 4 (L) 03/19/2021 0702   GFRAA 7 (L) 12/28/2019 1640    Renal function: Estimated Creatinine Clearance: 8.8 mL/min (A) (by C-G formula based on SCr of 12.52 mg/dL (H)).  Clinical ASCVD: No  The ASCVD Risk score (Arnett DK, et al., 2019) failed to calculate for the following reasons:   The valid total cholesterol range is 130 to 320 mg/dL  ASCVD risk factors include- [place]   ASSESSMENT & PLAN: Daniel [name] was seen today for hypertension.  Diagnoses and all orders for this visit: Essential hypertension -Counseled on lifestyle modifications for blood pressure control including reduced dietary sodium, increased exercise, weight reduction and adequate sleep. Also, educated patient about the risk for cardiovascular events, stroke and heart attack. Also counseled patient about the importance of medication adherence. If you participate in smoking, it is important to stop using tobacco as this will increase the risks associated with uncontrolled blood pressure.  Difficulty managing blood pressure medication with  noncompliance, alcohol and tobacco abuse end-stage renal disease on hemodialysis  -Hypertension longstanding diagnosed currently noncompliant with blood pressure medicines on current medications. Patient is not adherent with current medications.   Goal BP:  For patients younger than 60: Goal BP < 130/80. For patients 60 and older: Goal BP < 140/90. For patients with diabetes: Goal BP < 130/80. Your most recent BP: 150/89  Minimize salt intake. Minimize alcohol intake  Iron deficiency anemia due to chronic blood loss Reviewed of emergency room labs indicated low hemoglobin hematocrit acute blood loss from rectal bleeding.  Patient did not stay for evaluation and treatment.  Will refer to GI.  ESRD on hemodialysis [facility]) Followed by nephrology  Hospital discharge follow-up Complaint of rectal bleeding labs with taking patient left with low hemoglobin and hematocrit indicating to follow-up with PCP  This note has been created with Dragon speech recognition software and smart Company secretary. Any transcriptional errors are unintentional.   [name], NP 03/23/2021, 12:45 PM

## 2021-03-24 ENCOUNTER — Emergency Department (HOSPITAL_COMMUNITY): Payer: Medicare Other

## 2021-03-24 ENCOUNTER — Inpatient Hospital Stay (HOSPITAL_COMMUNITY): Payer: Medicare Other

## 2021-03-24 ENCOUNTER — Encounter (HOSPITAL_COMMUNITY): Payer: Self-pay | Admitting: Emergency Medicine

## 2021-03-24 ENCOUNTER — Other Ambulatory Visit: Payer: Self-pay

## 2021-03-24 ENCOUNTER — Inpatient Hospital Stay (HOSPITAL_COMMUNITY)
Admission: EM | Admit: 2021-03-24 | Discharge: 2021-03-27 | DRG: 377 | Disposition: A | Discharge status: Home / Self Care | Payer: Medicare Other | Attending: Internal Medicine | Admitting: Internal Medicine

## 2021-03-24 DIAGNOSIS — Z9115 Patient's noncompliance with renal dialysis: Secondary | ICD-10-CM

## 2021-03-24 DIAGNOSIS — M79605 Pain in left leg: Secondary | ICD-10-CM | POA: Diagnosis present

## 2021-03-24 DIAGNOSIS — Z87442 Personal history of urinary calculi: Secondary | ICD-10-CM

## 2021-03-24 DIAGNOSIS — K31819 Angiodysplasia of stomach and duodenum without bleeding: Secondary | ICD-10-CM | POA: Diagnosis not present

## 2021-03-24 DIAGNOSIS — R262 Difficulty in walking, not elsewhere classified: Secondary | ICD-10-CM | POA: Diagnosis present

## 2021-03-24 DIAGNOSIS — K922 Gastrointestinal hemorrhage, unspecified: Secondary | ICD-10-CM | POA: Diagnosis present

## 2021-03-24 DIAGNOSIS — E1151 Type 2 diabetes mellitus with diabetic peripheral angiopathy without gangrene: Secondary | ICD-10-CM | POA: Diagnosis present

## 2021-03-24 DIAGNOSIS — Z992 Dependence on renal dialysis: Secondary | ICD-10-CM

## 2021-03-24 DIAGNOSIS — Z6841 Body Mass Index (BMI) 40.0 and over, adult: Secondary | ICD-10-CM | POA: Diagnosis not present

## 2021-03-24 DIAGNOSIS — K8020 Calculus of gallbladder without cholecystitis without obstruction: Secondary | ICD-10-CM | POA: Diagnosis present

## 2021-03-24 DIAGNOSIS — I10 Essential (primary) hypertension: Secondary | ICD-10-CM | POA: Diagnosis not present

## 2021-03-24 DIAGNOSIS — N186 End stage renal disease: Secondary | ICD-10-CM | POA: Diagnosis present

## 2021-03-24 DIAGNOSIS — E8729 Other acidosis: Secondary | ICD-10-CM

## 2021-03-24 DIAGNOSIS — Z20822 Contact with and (suspected) exposure to covid-19: Secondary | ICD-10-CM | POA: Diagnosis present

## 2021-03-24 DIAGNOSIS — F1410 Cocaine abuse, uncomplicated: Secondary | ICD-10-CM | POA: Diagnosis not present

## 2021-03-24 DIAGNOSIS — E118 Type 2 diabetes mellitus with unspecified complications: Secondary | ICD-10-CM | POA: Diagnosis not present

## 2021-03-24 DIAGNOSIS — F1411 Cocaine abuse, in remission: Secondary | ICD-10-CM | POA: Diagnosis present

## 2021-03-24 DIAGNOSIS — I708 Atherosclerosis of other arteries: Secondary | ICD-10-CM | POA: Diagnosis present

## 2021-03-24 DIAGNOSIS — L089 Local infection of the skin and subcutaneous tissue, unspecified: Secondary | ICD-10-CM | POA: Diagnosis not present

## 2021-03-24 DIAGNOSIS — N2581 Secondary hyperparathyroidism of renal origin: Secondary | ICD-10-CM | POA: Diagnosis present

## 2021-03-24 DIAGNOSIS — T426X5A Adverse effect of other antiepileptic and sedative-hypnotic drugs, initial encounter: Secondary | ICD-10-CM | POA: Diagnosis present

## 2021-03-24 DIAGNOSIS — E1140 Type 2 diabetes mellitus with diabetic neuropathy, unspecified: Secondary | ICD-10-CM | POA: Diagnosis present

## 2021-03-24 DIAGNOSIS — D649 Anemia, unspecified: Secondary | ICD-10-CM | POA: Diagnosis present

## 2021-03-24 DIAGNOSIS — K921 Melena: Secondary | ICD-10-CM

## 2021-03-24 DIAGNOSIS — Z79899 Other long term (current) drug therapy: Secondary | ICD-10-CM

## 2021-03-24 DIAGNOSIS — G928 Other toxic encephalopathy: Secondary | ICD-10-CM | POA: Diagnosis present

## 2021-03-24 DIAGNOSIS — D62 Acute posthemorrhagic anemia: Secondary | ICD-10-CM | POA: Diagnosis present

## 2021-03-24 DIAGNOSIS — E119 Type 2 diabetes mellitus without complications: Secondary | ICD-10-CM

## 2021-03-24 DIAGNOSIS — M79604 Pain in right leg: Secondary | ICD-10-CM | POA: Diagnosis present

## 2021-03-24 DIAGNOSIS — K279 Peptic ulcer, site unspecified, unspecified as acute or chronic, without hemorrhage or perforation: Secondary | ICD-10-CM | POA: Diagnosis not present

## 2021-03-24 DIAGNOSIS — F17200 Nicotine dependence, unspecified, uncomplicated: Secondary | ICD-10-CM | POA: Diagnosis present

## 2021-03-24 DIAGNOSIS — D696 Thrombocytopenia, unspecified: Secondary | ICD-10-CM | POA: Diagnosis present

## 2021-03-24 DIAGNOSIS — K2980 Duodenitis without bleeding: Secondary | ICD-10-CM | POA: Diagnosis present

## 2021-03-24 DIAGNOSIS — R600 Localized edema: Secondary | ICD-10-CM | POA: Diagnosis not present

## 2021-03-24 DIAGNOSIS — I120 Hypertensive chronic kidney disease with stage 5 chronic kidney disease or end stage renal disease: Secondary | ICD-10-CM | POA: Diagnosis present

## 2021-03-24 DIAGNOSIS — E11628 Type 2 diabetes mellitus with other skin complications: Secondary | ICD-10-CM | POA: Diagnosis not present

## 2021-03-24 DIAGNOSIS — L738 Other specified follicular disorders: Secondary | ICD-10-CM | POA: Diagnosis present

## 2021-03-24 DIAGNOSIS — Z720 Tobacco use: Secondary | ICD-10-CM | POA: Diagnosis present

## 2021-03-24 DIAGNOSIS — K31811 Angiodysplasia of stomach and duodenum with bleeding: Principal | ICD-10-CM | POA: Diagnosis present

## 2021-03-24 DIAGNOSIS — F10129 Alcohol abuse with intoxication, unspecified: Secondary | ICD-10-CM | POA: Diagnosis not present

## 2021-03-24 DIAGNOSIS — Z5900 Homelessness unspecified: Secondary | ICD-10-CM

## 2021-03-24 DIAGNOSIS — K219 Gastro-esophageal reflux disease without esophagitis: Secondary | ICD-10-CM | POA: Diagnosis present

## 2021-03-24 DIAGNOSIS — Z8711 Personal history of peptic ulcer disease: Secondary | ICD-10-CM

## 2021-03-24 DIAGNOSIS — B1920 Unspecified viral hepatitis C without hepatic coma: Secondary | ICD-10-CM | POA: Diagnosis present

## 2021-03-24 DIAGNOSIS — Z885 Allergy status to narcotic agent status: Secondary | ICD-10-CM

## 2021-03-24 DIAGNOSIS — R531 Weakness: Secondary | ICD-10-CM | POA: Diagnosis present

## 2021-03-24 DIAGNOSIS — E1122 Type 2 diabetes mellitus with diabetic chronic kidney disease: Secondary | ICD-10-CM | POA: Diagnosis present

## 2021-03-24 DIAGNOSIS — K2990 Gastroduodenitis, unspecified, without bleeding: Secondary | ICD-10-CM | POA: Diagnosis present

## 2021-03-24 DIAGNOSIS — R609 Edema, unspecified: Secondary | ICD-10-CM | POA: Diagnosis not present

## 2021-03-24 DIAGNOSIS — M898X9 Other specified disorders of bone, unspecified site: Secondary | ICD-10-CM | POA: Diagnosis present

## 2021-03-24 DIAGNOSIS — E66812 Obesity, class 2: Secondary | ICD-10-CM | POA: Diagnosis present

## 2021-03-24 DIAGNOSIS — H0011 Chalazion right upper eyelid: Secondary | ICD-10-CM | POA: Diagnosis present

## 2021-03-24 DIAGNOSIS — F17210 Nicotine dependence, cigarettes, uncomplicated: Secondary | ICD-10-CM | POA: Diagnosis present

## 2021-03-24 DIAGNOSIS — R441 Visual hallucinations: Secondary | ICD-10-CM | POA: Insufficient documentation

## 2021-03-24 DIAGNOSIS — G8929 Other chronic pain: Secondary | ICD-10-CM | POA: Diagnosis present

## 2021-03-24 DIAGNOSIS — B182 Chronic viral hepatitis C: Secondary | ICD-10-CM | POA: Diagnosis present

## 2021-03-24 DIAGNOSIS — E1169 Type 2 diabetes mellitus with other specified complication: Secondary | ICD-10-CM | POA: Diagnosis not present

## 2021-03-24 DIAGNOSIS — E6609 Other obesity due to excess calories: Secondary | ICD-10-CM | POA: Diagnosis present

## 2021-03-24 DIAGNOSIS — F1011 Alcohol abuse, in remission: Secondary | ICD-10-CM | POA: Diagnosis present

## 2021-03-24 DIAGNOSIS — K2970 Gastritis, unspecified, without bleeding: Secondary | ICD-10-CM | POA: Diagnosis not present

## 2021-03-24 LAB — COMPREHENSIVE METABOLIC PANEL
ALT: 19 U/L (ref 0–44)
AST: 51 U/L — ABNORMAL HIGH (ref 15–41)
Albumin: 2.1 g/dL — ABNORMAL LOW (ref 3.5–5.0)
Alkaline Phosphatase: 64 U/L (ref 38–126)
Anion gap: 17 — ABNORMAL HIGH (ref 5–15)
BUN: 40 mg/dL — ABNORMAL HIGH (ref 8–23)
CO2: 23 mmol/L (ref 22–32)
Calcium: 8.6 mg/dL — ABNORMAL LOW (ref 8.9–10.3)
Chloride: 102 mmol/L (ref 98–111)
Creatinine, Ser: 11.59 mg/dL — ABNORMAL HIGH (ref 0.61–1.24)
GFR, Estimated: 5 mL/min — ABNORMAL LOW (ref >60)
Glucose, Bld: 111 mg/dL — ABNORMAL HIGH (ref 70–99)
Potassium: 4.3 mmol/L (ref 3.5–5.1)
Sodium: 142 mmol/L (ref 135–145)
Total Bilirubin: 0.5 mg/dL (ref 0.3–1.2)
Total Protein: 6.8 g/dL (ref 6.5–8.1)

## 2021-03-24 LAB — CBC
HCT: 26.2 % — ABNORMAL LOW (ref 39.0–52.0)
Hemoglobin: 8.1 g/dL — ABNORMAL LOW (ref 13.0–17.0)
MCH: 32 pg (ref 26.0–34.0)
MCHC: 30.9 g/dL (ref 30.0–36.0)
MCV: 103.6 fL — ABNORMAL HIGH (ref 80.0–100.0)
Platelets: 107 10*3/uL — ABNORMAL LOW (ref 150–400)
RBC: 2.53 MIL/uL — ABNORMAL LOW (ref 4.22–5.81)
RDW: 20.7 % — ABNORMAL HIGH (ref 11.5–15.5)
WBC: 5.4 10*3/uL (ref 4.0–10.5)
nRBC: 0.6 % — ABNORMAL HIGH (ref 0.0–0.2)

## 2021-03-24 LAB — CBC WITH DIFFERENTIAL/PLATELET
Abs Immature Granulocytes: 0.03 10*3/uL (ref 0.00–0.07)
Basophils Absolute: 0 10*3/uL (ref 0.0–0.1)
Basophils Relative: 0 %
Eosinophils Absolute: 0.1 10*3/uL (ref 0.0–0.5)
Eosinophils Relative: 2 %
HCT: 21.4 % — ABNORMAL LOW (ref 39.0–52.0)
Hemoglobin: 6.8 g/dL — CL (ref 13.0–17.0)
Immature Granulocytes: 1 %
Lymphocytes Relative: 43 %
Lymphs Abs: 2.2 10*3/uL (ref 0.7–4.0)
MCH: 33.5 pg (ref 26.0–34.0)
MCHC: 31.8 g/dL (ref 30.0–36.0)
MCV: 105.4 fL — ABNORMAL HIGH (ref 80.0–100.0)
Monocytes Absolute: 0.5 10*3/uL (ref 0.1–1.0)
Monocytes Relative: 10 %
Neutro Abs: 2.3 10*3/uL (ref 1.7–7.7)
Neutrophils Relative %: 44 %
Platelets: 103 10*3/uL — ABNORMAL LOW (ref 150–400)
RBC: 2.03 MIL/uL — ABNORMAL LOW (ref 4.22–5.81)
RDW: 19 % — ABNORMAL HIGH (ref 11.5–15.5)
Smear Review: DECREASED
WBC: 5.2 10*3/uL (ref 4.0–10.5)
nRBC: 0.6 % — ABNORMAL HIGH (ref 0.0–0.2)

## 2021-03-24 LAB — RESP PANEL BY RT-PCR (FLU A&B, COVID) ARPGX2
Influenza A by PCR: NEGATIVE
Influenza B by PCR: NEGATIVE
SARS Coronavirus 2 by RT PCR: NEGATIVE

## 2021-03-24 LAB — AMMONIA: Ammonia: 23 umol/L (ref 9–35)

## 2021-03-24 LAB — TSH: TSH: 3.465 u[IU]/mL (ref 0.350–4.500)

## 2021-03-24 LAB — ETHANOL: Alcohol, Ethyl (B): <10 mg/dL (ref <10)

## 2021-03-24 LAB — POC OCCULT BLOOD, ED: Fecal Occult Bld: POSITIVE — AB

## 2021-03-24 LAB — PREPARE RBC (CROSSMATCH)

## 2021-03-24 LAB — PROTIME-INR
INR: 1 (ref 0.8–1.2)
Prothrombin Time: 12.7 seconds (ref 11.4–15.2)

## 2021-03-24 LAB — CBG MONITORING, ED
Glucose-Capillary: 88 mg/dL (ref 70–99)
Glucose-Capillary: 71 mg/dL (ref 70–99)

## 2021-03-24 LAB — MAGNESIUM: Magnesium: 2.2 mg/dL (ref 1.7–2.4)

## 2021-03-24 MED ORDER — PANTOPRAZOLE SODIUM 40 MG IV SOLR
40.0000 mg | Freq: Two times a day (BID) | INTRAVENOUS | Status: DC
  Administered 2021-03-24 (×2): 40 mg via INTRAVENOUS
  Filled 2021-03-24 (×2): qty 40

## 2021-03-24 MED ORDER — DOXERCALCIFEROL 4 MCG/2ML IV SOLN
3.0000 ug | INTRAVENOUS | Status: DC
  Administered 2021-03-26: 3 ug via INTRAVENOUS
  Filled 2021-03-24 (×2): qty 2

## 2021-03-24 MED ORDER — LORAZEPAM 2 MG/ML IJ SOLN: 1.0000 mg | INTRAMUSCULAR | Status: DC | PRN

## 2021-03-24 MED ORDER — ADULT MULTIVITAMIN W/MINERALS CH
1.0000 | ORAL_TABLET | Freq: Every day | ORAL | Status: DC
  Administered 2021-03-25 – 2021-03-26 (×2): 1 via ORAL
  Filled 2021-03-24 (×2): qty 1

## 2021-03-24 MED ORDER — LORAZEPAM 1 MG PO TABS: 1.0000 mg | ORAL_TABLET | ORAL | Status: DC | PRN

## 2021-03-24 MED ORDER — ACETAMINOPHEN 650 MG RE SUPP: 650.0000 mg | Freq: Four times a day (QID) | RECTAL | Status: DC | PRN

## 2021-03-24 MED ORDER — SODIUM THIOSULFATE 250 MG/ML IV SOLN
25.0000 g | INTRAVENOUS | Status: DC
  Filled 2021-03-24: qty 100

## 2021-03-24 MED ORDER — ACETAMINOPHEN 325 MG PO TABS
650.0000 mg | ORAL_TABLET | Freq: Once | ORAL | Status: AC
  Administered 2021-03-24: 11:00:00 650 mg via ORAL
  Filled 2021-03-24: qty 2

## 2021-03-24 MED ORDER — THIAMINE HCL 100 MG PO TABS
100.0000 mg | ORAL_TABLET | Freq: Every day | ORAL | Status: DC
  Administered 2021-03-25 – 2021-03-27 (×3): 100 mg via ORAL
  Filled 2021-03-24 (×3): qty 1

## 2021-03-24 MED ORDER — NICOTINE 21 MG/24HR TD PT24
21.0000 mg | MEDICATED_PATCH | Freq: Every day | TRANSDERMAL | Status: DC
  Administered 2021-03-24 – 2021-03-26 (×3): 21 mg via TRANSDERMAL
  Filled 2021-03-24 (×4): qty 1

## 2021-03-24 MED ORDER — SODIUM CHLORIDE 0.9 % IV SOLN: 250.0000 mL | INTRAVENOUS | Status: DC | PRN

## 2021-03-24 MED ORDER — HYDRALAZINE HCL 20 MG/ML IJ SOLN: 10.0000 mg | Freq: Three times a day (TID) | INTRAMUSCULAR | Status: DC | PRN

## 2021-03-24 MED ORDER — DARBEPOETIN ALFA 200 MCG/0.4ML IJ SOSY
200.0000 ug | PREFILLED_SYRINGE | Freq: Once | INTRAMUSCULAR | Status: DC
  Filled 2021-03-24: qty 0.4

## 2021-03-24 MED ORDER — SODIUM CHLORIDE 0.9% FLUSH: 3.0000 mL | INTRAVENOUS | Status: DC | PRN

## 2021-03-24 MED ORDER — SODIUM CHLORIDE 0.9% FLUSH
3.0000 mL | Freq: Two times a day (BID) | INTRAVENOUS | Status: DC
  Administered 2021-03-25 – 2021-03-27 (×3): 3 mL via INTRAVENOUS

## 2021-03-24 MED ORDER — FOLIC ACID 1 MG PO TABS
1.0000 mg | ORAL_TABLET | Freq: Every day | ORAL | Status: DC
  Administered 2021-03-25 – 2021-03-27 (×3): 1 mg via ORAL
  Filled 2021-03-24 (×3): qty 1

## 2021-03-24 MED ORDER — SODIUM CHLORIDE 0.9% IV SOLUTION: Freq: Once | INTRAVENOUS | Status: AC

## 2021-03-24 MED ORDER — SODIUM CHLORIDE 0.9 % IV SOLN
125.0000 mg | INTRAVENOUS | Status: DC
  Administered 2021-03-26: 125 mg via INTRAVENOUS
  Filled 2021-03-24: qty 10

## 2021-03-24 MED ORDER — INSULIN ASPART 100 UNIT/ML IJ SOLN
0.0000 [IU] | Freq: Three times a day (TID) | INTRAMUSCULAR | Status: DC
Start: 2021-03-24 — End: 2021-03-27

## 2021-03-24 MED ORDER — CHLORHEXIDINE GLUCONATE CLOTH 2 % EX PADS: 6.0000 | MEDICATED_PAD | Freq: Every day | CUTANEOUS | Status: DC

## 2021-03-24 MED ORDER — ACETAMINOPHEN 325 MG PO TABS
650.0000 mg | ORAL_TABLET | Freq: Four times a day (QID) | ORAL | Status: DC | PRN
  Administered 2021-03-24 – 2021-03-26 (×3): 650 mg via ORAL
  Filled 2021-03-24 (×4): qty 2

## 2021-03-24 MED ORDER — CYCLOBENZAPRINE HCL 10 MG PO TABS
5.0000 mg | ORAL_TABLET | Freq: Three times a day (TID) | ORAL | Status: DC | PRN
  Administered 2021-03-24 – 2021-03-27 (×6): 5 mg via ORAL
  Filled 2021-03-24 (×6): qty 1

## 2021-03-24 NOTE — ED Triage Notes (Signed)
Pt to triage via [facility] from home.  EMS called out for seizures.  Pt with extremity twitching for 3 days with pain.  Recently started Gabapentin.  Also reports visual hallucinations (blurry people) since starting medication.

## 2021-03-24 NOTE — Consult Note (Signed)
Referring Provider: [facility] PCP: [name], NP  Gastroenterologist: Althia Forts  Reason for consultation:   Anemia                ASSESSMENT / PLAN   #  60 year old male with multiple medical problems presenting with progressive anemia / FOBT positive and several admissions for the same. He has a history of PUD / duodenitis, small bowel AVMs ( one actively bleeding duodenal AVM late last month)  Hgb drifted over last couple of weeks, 8.1 >>> 6.8.  Heme positive but no overt bleeding      --He will probably need another upper endoscopy this admission. He has significant edema in his lower extremities and says he is due for dialysis tomorrow.   --Supportive care for now, timing of EGD will be decided later.  --Currently getting a unit of blood --BID IV PPI already ordered  # ESRD on HD  # Additional medical history listed below.   HISTORY OF PRESENT ILLNESS                                                                                                                         Chief Complaint:  anemia  Daniel [name] is a 60 y.o. male with a past medical history significant for ESRD on dialysis , hypertension , cholelithiasis, kidney stones, diabetes , HCV, recurrent GI bleeds, PUD, small bowel AVMs. See PMH for any additional medical problems.   Daniel [name] has been hospitalized several times for anemia and GI bleeds.  He has a history of PUD/H. pylori infection treated with quadruple therapy.  We last saw the patient when he was hospitalized at the end of October for symptomatic anemia, melena. He underwent a small bowel endoscopy that admission with findings of gastritis, healed gastric ulcer, multiple duodenal polyps, duodenal nodule, and 3 angioectasias in the duodenum, 1 of which was actively bleeding and treated .  Bleeding stopped, hemoglobin stabilized at 8.1.  Patient has a follow-up scheduled with Dr. Havery Moros on 03/27/2021    ED course:  Patient  presented to ED today for evaluation of weakness and left food pain.  Hemoglobin found to be 6.8.   1 unit of blood ordered.  Patient was started on a clear liquid diet, IV PPI and we were called for consultation.He hasn't had any black stools or red blood in stools. He has not had any nausea / vomiting except for one episode today. He didn't look at emesis but believes it contained food. It has recently been in ED several times for pain and weakness of his legs, weakness.   IMAGING:  DG Chest 2 View  Result Date: 03/24/2021 CLINICAL DATA:  Weakness.  Seizures. EXAM: CHEST - 2 VIEW COMPARISON:  Radiographs 03/17/2021 and CT 06/29/2019. FINDINGS: Stable cardiomegaly and aortic atherosclerosis. There is vascular congestion without overt pulmonary edema, confluent airspace opacity, pneumothorax or significant pleural effusion. Mild degenerative changes are present within the spine. Telemetry leads overlie the  chest. Vascular stent noted overlying the left shoulder. IMPRESSION: Cardiomegaly with chronic vascular congestion. No acute findings identified. Electronically Signed   By: Richardean Sale M.D.   On: 03/24/2021 12:30      PREVIOUS ENDOSCOPIC EVALUATIONS  / IMAGING STUDIES  PREVIOUS ENDOSCOPIES:      **Copied from our consult note 02/23/2021         March 2021 Colonoscopy and EGD for anemia and FOBT Colonoscopy --complete exam, good prep --No active bleeding or source of heme positive stool/anemia seen. - Diverticulosis in the sigmoid colon. - Probable old blood in the terminal ileum, raising the question of a small bowel source (given negative egd). - The distal rectum and anal verge are normal on retroflexion view. - No specimens collected. EGD - Normal larynx. - Normal esophagus. - Normal stomach. - Duodenal polyp vs. prominent papilla, benign-appearing. Biopsied. FINAL MICROSCOPIC DIAGNOSIS:   A. DUODENUM, BIOPSY:  - Patchy chronic duodenitis with surface gastric foveolar  metaplasia,  suggestive of peptic duodenitis   B. DUODENUM, BIOPSY:  - Patchy chronic duodenitis with surface gastric foveolar metaplasia,  suggestive of peptic duodenitis    March 2021 Small bowel video capsule study --Normal study . No blood seen in 13 hours    08/02/19 Small bowel enteroscopy for intermittent dark stools.  --Esophagogastric landmarks identified. - Normal esophagus. - Normal stomach. - Prominent ampulla without overt adenomatous changes. Biopsied. - A few benign appearing duodenal polyps. Biopsied, one suspected AVM treated as above. 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 Treated with argon plasma coagulation (APC). - A single non-bleeding angiodysplastic lesion in the proximal jejunum. Treated with argon plasma coagulation (APC).   FINAL MICROSCOPIC DIAGNOSIS:   A. AMPULLA, BIOPSY:  - Pyloric gland adenoma  - Negative for cytologic dysplasia in submitted biopsies   B. DUODENUM BULB, BIOPSY:  - Duodenal mucosa with mild reactive changes  - Negative for dysplasia, increased intraepithelial lymphocytes or  villous architectural changes      08/10/20 EGD for melena  --normal esophagus --Gastriits --Bleeding duodenal ulcer with a visible vessel. Clips were placed x 3 with apparent hemostasis. - Duodenitis with nodularity and polypoid mucosa. - No specimens collected     EGD Sept 17, 2022 -Oozing prepyloric gastric ulcer, treated with epinephrine and Hemoclip x2 - H. pylori positive gastritis   EGD February 08, 2021 -Small clean-based antral ulcer -Previously placed hemoclips prolapsed into duodenal bulb - No evidence of bleeding and no stigmata -repeat biopsies negative for H. Pylori  EGD 01/3021 Small bowel endoscopy  -  Mucosal changes in the esophagus. Biopsied. - Gastritis. Previously diagnosed and treated as H. pylori gastritis. The previously noted gastric ulcers have healed. - Multiple duodenal polyps. Suspect these are  reactive/inflammatory. Biopsied. - Duodenal nodule(s). Biopsied. Suspect this is the adenoma previous biopsied in 2021. - Three angioectasias in the duodenum, one of which was actively bleeding. Treated with argon plasma coagulation (APC). Injected.  FINAL MICROSCOPIC DIAGNOSIS:   A. DUODENUM, NODULE, BIOPSY:  - Benign polypoid duodenal mucosa with underlying somewhat prominent  Brunner's gland ducts  - Negative for dysplasia or malignancy   B. DUODENUM, BULB, POLYPECTOMY:  - Polypoid duodenal mucosa with chronic inflammation and surface  foveolar metaplasia, suggestive of nodular peptic duodenitis  - Negative for dysplasia or malignancy   C. ESOPHAGUS, BIOPSY:  - Esophageal squamous mucosa showing a somewhat two-tone appearance with  an intraepithelial cleft and associated acute inflammation. See comment   COMMENT:   The superficial esophageal  layer above the split shows hypereosinophilic  cells with parakeratosis, whereas the deeper layer appears relatively  normal. 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 The etiology of this entity is  uncertain, and the condition can sometimes be associated with chronic  debilitation and certain medications. Clinical correlation is suggested.  There is no evidence of viral cytopathic changes.   Past Medical History:  Diagnosis Date   Anemia    Diabetes mellitus without complication [facility])    patient denies   Dialysis patient [facility])    End stage chronic kidney disease ([place])    Hypertension    ICH (intracerebral hemorrhage) ([place]) 05/20/2017   Shoulder pain, left 06/28/2013    Past Surgical History:  Procedure Laterality Date   A/V FISTULAGRAM N/A 08/15/2020   Procedure: A/V FISTULAGRAM - Left Upper;  Surgeon: Cherre Robins, MD;  Location: [place] [facility];  Service: Cardiovascular;  Laterality: N/A;    APPENDECTOMY     AV FISTULA PLACEMENT Left 08/03/2019   Procedure: LEFT ARM ARTERIOVENOUS (AV) CEPHALIC  FISTULA CREATION;  Surgeon: Waynetta Sandy, MD;  Location: [place];  Service: Vascular;  Laterality: Left;   BIOPSY  06/30/2019   Procedure: BIOPSY;  Surgeon: Ronald Lobo, MD;  Location: [place];  Service: Endoscopy;;   BIOPSY  08/02/2019   Procedure: BIOPSY;  Surgeon: Yetta Flock, MD;  Location: [facility] ENDOSCOPY;  Service: Gastroenterology;;   BIOPSY  02/08/2021   Procedure: BIOPSY;  Surgeon: Sharyn Creamer, MD;  Location: [place];  Service: Gastroenterology;;   BIOPSY  02/24/2021   Procedure: BIOPSY;  Surgeon: [name], MD;  Location: [place];  Service: Gastroenterology;;   COLONOSCOPY  01/23/2012   Procedure: COLONOSCOPY;  Surgeon: Danie Binder, MD;  [facility];  Service: Endoscopy;  Laterality: N/A;  11:10 AM   COLONOSCOPY WITH PROPOFOL N/A 06/30/2019   Procedure: COLONOSCOPY WITH PROPOFOL;  Surgeon: Ronald Lobo, MD;  Location: [place];  Service: Endoscopy;  Laterality: N/A;   ENTEROSCOPY N/A 08/02/2019   Procedure: ENTEROSCOPY;  Surgeon: Yetta Flock, MD;  Location: [facility] ENDOSCOPY;  Service: Gastroenterology;  Laterality: N/A;   ENTEROSCOPY N/A 02/24/2021   Procedure: ENTEROSCOPY;  Surgeon: [name], MD;  Location: [facility] ENDOSCOPY;  Service: Gastroenterology;  Laterality: N/A;   ESOPHAGOGASTRODUODENOSCOPY N/A 08/10/2020   Procedure: ESOPHAGOGASTRODUODENOSCOPY (EGD);  Surgeon: Jerene Bears, MD;  Location: [facility] ENDOSCOPY;  Service: Gastroenterology;  Laterality: N/A;   ESOPHAGOGASTRODUODENOSCOPY (EGD) WITH PROPOFOL N/A 06/30/2019   Procedure: ESOPHAGOGASTRODUODENOSCOPY (EGD) WITH PROPOFOL;  Surgeon: Ronald Lobo, MD;  Location: [place];  Service: Endoscopy;  Laterality: N/A;   ESOPHAGOGASTRODUODENOSCOPY (EGD) WITH PROPOFOL N/A 01/12/2021   Procedure: ESOPHAGOGASTRODUODENOSCOPY (EGD) WITH PROPOFOL;  Surgeon:  Sharyn Creamer, MD;  Location: [place];  Service: Gastroenterology;  Laterality: N/A;   ESOPHAGOGASTRODUODENOSCOPY (EGD) WITH PROPOFOL N/A 02/08/2021   Procedure: ESOPHAGOGASTRODUODENOSCOPY (EGD) WITH PROPOFOL;  Surgeon: Sharyn Creamer, MD;  Location: [place];  Service: Gastroenterology;  Laterality: N/A;   FISTULA SUPERFICIALIZATION Left 10/17/2019   Procedure: LEFT UPPER EXTREMITY FISTULA REVISION, SIDE BRANCH LIGATION,  AND SUPERFICIALIZATION;  Surgeon: Marty Heck, MD;  Location: [place];  Service: Vascular;  Laterality: Left;   GIVENS CAPSULE STUDY N/A 06/30/2019   Procedure: GIVENS CAPSULE STUDY;  Surgeon: Ronald Lobo, MD;  Location: [place];  Service: Endoscopy;  Laterality: N/A;   HEMOSTASIS CLIP PLACEMENT  08/10/2020   Procedure: HEMOSTASIS  CLIP PLACEMENT;  Surgeon: Jerene Bears, MD;  Location: [place];  Service: Gastroenterology;;   HEMOSTASIS CLIP PLACEMENT  01/12/2021   Procedure: HEMOSTASIS CLIP PLACEMENT;  Surgeon: Sharyn Creamer, MD;  Location: [place];  Service: Gastroenterology;;   HEMOSTASIS CONTROL  08/02/2019   Procedure: HEMOSTASIS CONTROL;  Surgeon: Yetta Flock, MD;  Location: [place];  Service: Gastroenterology;;   HOT HEMOSTASIS  02/24/2021   Procedure: HOT HEMOSTASIS (ARGON PLASMA COAGULATION/BICAP);  Surgeon: [name], MD;  Location: [facility] ENDOSCOPY;  Service: Gastroenterology;;   INCISION AND DRAINAGE ABSCESS N/A 06/29/2016   Procedure: INCISION AND DRAINAGE ABDOMINAL WALL ABSCESS;  Surgeon: Alphonsa Overall, MD;  Location: [facility];  Service: General;  Laterality: N/A;   INSERTION OF DIALYSIS CATHETER Right 08/03/2019   Procedure: INSERTION OF DIALYSIS CATHETER;  Surgeon: Waynetta Sandy, MD;  Location: [place];  Service: Vascular;  Laterality: Right;   INSERTION OF DIALYSIS CATHETER Right 10/22/2019   Procedure: INSERTION OF 23CM TUNNELED DIALYSIS CATHETER RIGHT INTERNAL JUGULAR;  Surgeon: Angelia Mould, MD;   Location: [place];  Service: Vascular;  Laterality: Right;   INSERTION OF DIALYSIS CATHETER Right 08/12/2020   Procedure: INSERTION OF Right internal Jugular TUNNELED  DIALYSIS CATHETER.;  Surgeon: Waynetta Sandy, MD;  Location: [place];  Service: Vascular;  Laterality: Right;   Left heel surgery     PENILE BIOPSY N/A 03/26/2020   Procedure: PENILE ULCER DEBRIDEMENT;  Surgeon: Remi Haggard, MD;  Location: [facility];  Service: Urology;  Laterality: N/A;  30 MINS   SCLEROTHERAPY  01/12/2021   Procedure: SCLEROTHERAPY;  Surgeon: Sharyn Creamer, MD;  Location: [facility] ENDOSCOPY;  Service: Gastroenterology;;   Clide Deutscher  02/24/2021   Procedure: Clide Deutscher;  Surgeon: [name], MD;  Location: [facility] ENDOSCOPY;  Service: Gastroenterology;;    Prior to Admission medications   Medication Sig Start Date End Date Taking? Authorizing Provider  amLODipine (NORVASC) 10 MG tablet Take 1 tablet (10 mg total) by mouth at bedtime. 03/23/21   [name], NP  blood glucose meter kit and supplies KIT Dispense based on patient and insurance preference. Use up to four times daily as directed. (FOR ICD-9 250.00, 250.01). 07/01/16   Nita Sells, MD  calcium acetate (PHOSLO) 667 MG capsule Take 1,334 mg by mouth 3 (three) times daily with meals. 03/14/21   [provider]  cloNIDine (CATAPRES) 0.1 MG tablet Take 0.1 mg by mouth 2 (two) times daily. 03/13/21   [provider]  cloNIDine (CATAPRES) 0.2 MG tablet Take  1 tablet twice daily . Patient not taking: Reported on 03/19/2021 10/28/20   Donnamae Jude, MD  doxepin (SINEQUAN) 10 MG capsule Take 10 mg by mouth at bedtime. 01/03/21   [provider]  gabapentin (NEURONTIN) 300 MG capsule Take 1 capsule (300 mg total) by mouth 2 (two) times daily. 03/19/21   Isla Pence, MD  HYDROcodone-acetaminophen (NORCO/VICODIN) 5-325 MG tablet Take 1 tablet by mouth every 4 (four) hours as needed. 03/19/21   Isla Pence, MD  hydrOXYzine (ATARAX/VISTARIL) 25 MG tablet TAKE 1 TABLET BY MOUTH EVERY 8 HOURS AS NEEDED FOR ANXIETY OR  ITCHING Patient taking differently: Take 25 mg by mouth in the morning and at bedtime. 11/07/20   [name], NP  lidocaine-prilocaine (EMLA) cream Apply 1 application topically once. 12/27/20   [provider]  pantoprazole (PROTONIX) 40 MG tablet Take 1 tablet (40 mg total) by mouth 2 (two) times daily. 02/28/21  Charlynne Cousins, MD  colchicine 0.6 MG tablet Take 0.5 tablets (0.3 mg total) by mouth 2 (two) times daily. 07/24/20 07/29/20  Noemi Chapel, MD  ferrous sulfate 325 (65 FE) MG tablet SMARTSIG:1 Tablet(s) By Mouth Twice Daily 08/11/19 02/09/20  [provider]  furosemide (LASIX) 40 MG tablet Take 1 tablet (40 mg total) by mouth daily. 07/25/19 02/09/20  Ladona Horns, MD    Current Facility-Administered Medications  Medication Dose Route Frequency Provider Last Rate Last Admin   cyclobenzaprine (FLEXERIL) tablet 5 mg  5 mg Oral TID PRN Orma Flaming, MD       insulin aspart (novoLOG) injection 0-6 Units  0-6 Units Subcutaneous TID WC Orma Flaming, MD       pantoprazole (PROTONIX) injection 40 mg  40 mg Intravenous Q12H Orma Flaming, MD       Current Outpatient Medications  Medication Sig Dispense Refill   amLODipine (NORVASC) 10 MG tablet Take 1 tablet (10 mg total) by mouth at bedtime. 90 tablet 0   blood glucose meter kit and supplies KIT Dispense based on patient and insurance preference. Use up to four times daily as directed. (FOR ICD-9 250.00, 250.01). 1 each 0   calcium acetate (PHOSLO) 667 MG capsule Take 1,334 mg by mouth 3 (three) times daily with meals.     cloNIDine (CATAPRES) 0.1 MG tablet Take 0.1 mg by mouth 2 (two) times daily.     cloNIDine (CATAPRES) 0.2 MG tablet Take  1 tablet twice daily . (Patient not taking: Reported on 03/19/2021) 180 tablet 1   doxepin (SINEQUAN) 10 MG capsule Take 10 mg by mouth at bedtime.      gabapentin (NEURONTIN) 300 MG capsule Take 1 capsule (300 mg total) by mouth 2 (two) times daily. 60 capsule 0   HYDROcodone-acetaminophen (NORCO/VICODIN) 5-325 MG tablet Take 1 tablet by mouth every 4 (four) hours as needed. 10 tablet 0   hydrOXYzine (ATARAX/VISTARIL) 25 MG tablet TAKE 1 TABLET BY MOUTH EVERY 8 HOURS AS NEEDED FOR ANXIETY OR  ITCHING (Patient taking differently: Take 25 mg by mouth in the morning and at bedtime.) 90 tablet 0   lidocaine-prilocaine (EMLA) cream Apply 1 application topically once.     pantoprazole (PROTONIX) 40 MG tablet Take 1 tablet (40 mg total) by mouth 2 (two) times daily. 60 tablet 2    Allergies as of 03/24/2021 - Review Complete 03/24/2021  Allergen Reaction Noted   Dilaudid [hydromorphone hcl] Itching and Other (See Comments) 07/29/2020    Family History  Problem Relation Age of Onset   Colon cancer Neg Hx     Social History   Socioeconomic History   Marital status: Single    Spouse name: Not on file   Number of children: Not on file   Years of education: Not on file   Highest education level: Not on file  Occupational History   Not on file  Tobacco Use   Smoking status: Every Day    Packs/day: 1.00    Years: 30.00    Pack years: 30.00    Types: Cigarettes   Smokeless tobacco: Never  Vaping Use   Vaping Use: Never used  Substance and Sexual Activity   Alcohol use: Not Currently    Alcohol/week: 12.0 standard drinks    Types: 12 Cans of beer per week   Drug use: Not Currently    Types: "Crack" cocaine    Comment: last in 2020   Sexual activity: Yes    Birth control/protection: None  Other Topics Concern   Not on file  Social History Narrative   Not on file   Social Determinants of Health   Financial Resource Strain: Not on file  Food Insecurity: Not on file  Transportation Needs: Not on file  Physical Activity: Not on file  Stress: Not on file  Social Connections: Not on file  Intimate Partner Violence: Not on file     Review of Systems: All systems reviewed and negative except where noted in HPI.   OBJECTIVE    Physical Exam: Vital signs in last 24 hours: Temp:  [97.7 F (36.5 C)-99.7 F (37.6 C)] 97.7 F (36.5 C) (11/27 1353) Pulse Rate:  [77-88] 79 (11/27 1353) Resp:  [11-26] 20 (11/27 1353) BP: (103-131)/(53-93) 123/82 (11/27 1353) SpO2:  [95 %-99 %] 98 % (11/27 1353) Weight:  [117.9 kg] 117.9 kg (11/27 1034)    General:  Alert obese male in NAD Psych:  Pleasant, cooperative. Normal mood and affect Eyes: Pupils equal, no icterus. Conjunctive pink Ears:  Normal auditory acuity Nose: No deformity, discharge or lesions Neck:  Supple, no masses felt Lungs:  decreased breath sound LLL.   Heart:  Regular rate, regular rhythm. Pitting edema of BLE. Left foot edema > right foot  Abdomen:  Soft, nondistended, nontender, active bowel sounds, no masses felt Rectal :  Deferred Msk: Symmetrical without gross deformities.  Neurologic:  Alert, oriented, grossly normal neurologically Skin:  Intact without significant lesions.    Scheduled inpatient medications  insulin aspart  0-6 Units Subcutaneous TID WC   pantoprazole (PROTONIX) IV  40 mg Intravenous Q12H    Intake/Output from previous day: No intake/output data recorded. Intake/Output this shift: No intake/output data recorded.   Lab Results: Recent Labs    03/24/21 0917  WBC 5.2  HGB 6.8*  HCT 21.4*  PLT 103*   BMET Recent Labs    03/24/21 0917  NA 142  K 4.3  CL 102  CO2 23  GLUCOSE 111*  BUN 40*  CREATININE 11.59*  CALCIUM 8.6*   LFTs Recent Labs    03/24/21 0917  PROT 6.8  ALBUMIN 2.1*  AST 51*  ALT 19  ALKPHOS 64  BILITOT 0.5   PT/INR No results for input(s): LABPROT, INR in the last 72 hours. Hepatitis Panel No results for input(s): HEPBSAG, HCVAB, HEPAIGM, HEPBIGM in the last 72 hours.   . CBC Latest Ref Rng & Units 03/24/2021 03/19/2021 03/18/2021  WBC 4.0 - 10.5 K/uL 5.2 4.8 5.0   Hemoglobin 13.0 - 17.0 g/dL 6.8(LL) 7.0(L) 7.3(L)  Hematocrit 39.0 - 52.0 % 21.4(L) 22.5(L) 23.0(L)  Platelets 150 - 400 K/uL 103(L) 128(L) 122(L)    . CMP Latest Ref Rng & Units 03/24/2021 03/19/2021 03/18/2021  Glucose 70 - 99 mg/dL 111(H) 81 94  BUN 8 - 23 mg/dL 40(H) 45(H) 31(H)  Creatinine 0.61 - 1.24 mg/dL 11.59(H) 12.52(H) 11.30(H)  Sodium 135 - 145 mmol/L 142 148(H) 143  Potassium 3.5 - 5.1 mmol/L 4.3 3.7 3.4(L)  Chloride 98 - 111 mmol/L 102 114(H) 104  CO2 22 - 32 mmol/L 23 22 24   Calcium 8.9 - 10.3 mg/dL 8.6(L) 8.4(L) 8.4(L)  Total Protein 6.5 - 8.1 g/dL 6.8 - 7.1  Total Bilirubin 0.3 - 1.2 mg/dL 0.5 - 0.8  Alkaline Phos 38 - 126 U/L 64 - 64  AST 15 - 41 U/L 51(H) - 55(H)  ALT 0 - 44 U/L 19 - 22     Active Problems:   Upper GI bleed  Tye Savoy, NP-C @  03/24/2021, 1:58 PM

## 2021-03-24 NOTE — ED Provider Notes (Signed)
[facility] EMERGENCY DEPARTMENT Provider Note   CSN: [id number] Arrival date & time: 03/24/21  1191     History Chief Complaint  Patient presents with   Medication Reaction    Daniel [name] is a 60 y.o. male presenting for evaluation of increased weakness and confusion.  Patient states for the past 3 days, he has had increased spasming of both arms.  This occurs when he is reaching for something.  Yesterday after his dialysis session he had worsened weakness and confusion, and today he is unable to walk when he normally can.  He has been seen multiple times in the past week or 2 for leg pain, which he reports is still severe.  It has not improved with any of his medication.  His gabapentin dose was increased about 5 days ago, but this is a long-term medicine for him, and his new symptoms did not begin after starting the gabapentin.  He had a negative ultrasound during his last visit and an overall reassuring CTA.  He is not on any blood thinners.  He reports when he walks he feels dizzy, needs to hold onto something so he does not fall over, this is new for him.  He denies nausea, vomiting, abdominal pain, change in bowel movements.  No blood in his stool that he has seen.  He does produce minimal urine, it is unchanged.  No chest pain, shortness of breath, or cough.  Additional history obtained from chart review.  Patient with a history of chronic anemia, diabetes, ESRD on dialysis goes Tuesday, Thursday, Saturday, hypertension, ICH   HPI     Past Medical History:  Diagnosis Date   Anemia    Diabetes mellitus without complication [facility])    patient denies   Dialysis patient [facility])    End stage chronic kidney disease ([place])    Hypertension    ICH (intracerebral hemorrhage) ([place]) 05/20/2017   Shoulder pain, left 06/28/2013    Patient Active Problem List   Diagnosis Date Noted   Upper GI bleed 03/24/2021   Chalazion of right upper eyelid 03/24/2021    Thrombocytopenia ([place]) 03/24/2021   Visual hallucination 03/24/2021   Bilateral leg edema 03/24/2021   Acute blood loss anemia    Benign neoplasm of duodenum, jejunum, and ileum    Anemia due to chronic kidney disease 02/23/2021   Prolonged QT interval 02/23/2021   Rotator cuff tear arthropathy of right shoulder 01/23/2021   Alcohol dependence ([place]) 01/13/2021   Gastric ulcer with hemorrhage 01/13/2021   Generalized weakness    Transaminitis 01/10/2021   History of alcohol abuse 10/27/2020   History of cocaine abuse ([place]) 10/27/2020   Nicotine dependence, cigarettes, uncomplicated 47/82/9562   Acute GI bleeding 08/10/2020   Gastrointestinal hemorrhage with melena    Duodenal ulcer with hemorrhage    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   ESRD on hemodialysis ([place])    AVM (arteriovenous malformation) of small bowel, acquired with hemorrhage    Symptomatic anemia 07/31/2019   Acute on chronic anemia 07/23/2019   Chronic hepatitis C without hepatic coma ([place]) 07/11/2019   Iron deficiency anemia 03/30/2019   Alcohol use disorder 03/30/2019   B12 deficiency 03/30/2019   Orthostatic hypotension 01/22/2019   Essential hypertension 06/29/2016   DM2 (diabetes mellitus, type 2) ([place]) 06/29/2016    Past Surgical History:  Procedure Laterality Date   A/V FISTULAGRAM N/A 08/15/2020   Procedure: A/V FISTULAGRAM - Left Upper;  Surgeon: Cherre Robins, MD;  Location: [place] [facility];  Service: Cardiovascular;  Laterality: N/A;   APPENDECTOMY     AV FISTULA PLACEMENT Left 08/03/2019   Procedure: LEFT ARM ARTERIOVENOUS (AV) CEPHALIC  FISTULA CREATION;  Surgeon: Waynetta Sandy, MD;  Location: [place];  Service: Vascular;  Laterality: Left;   BIOPSY  06/30/2019   Procedure: BIOPSY;  Surgeon: Ronald Lobo, MD;  Location: [place];  Service: Endoscopy;;   BIOPSY  08/02/2019   Procedure: BIOPSY;  Surgeon: Yetta Flock, MD;  Location: [facility] ENDOSCOPY;  Service:  Gastroenterology;;   BIOPSY  02/08/2021   Procedure: BIOPSY;  Surgeon: Sharyn Creamer, MD;  Location: [place];  Service: Gastroenterology;;   BIOPSY  02/24/2021   Procedure: BIOPSY;  Surgeon: [name], MD;  Location: [place];  Service: Gastroenterology;;   COLONOSCOPY  01/23/2012   Procedure: COLONOSCOPY;  Surgeon: Danie Binder, MD;  [facility];  Service: Endoscopy;  Laterality: N/A;  11:10 AM   COLONOSCOPY WITH PROPOFOL N/A 06/30/2019   Procedure: COLONOSCOPY WITH PROPOFOL;  Surgeon: Ronald Lobo, MD;  Location: [place];  Service: Endoscopy;  Laterality: N/A;   ENTEROSCOPY N/A 08/02/2019   Procedure: ENTEROSCOPY;  Surgeon: Yetta Flock, MD;  Location: [facility] ENDOSCOPY;  Service: Gastroenterology;  Laterality: N/A;   ENTEROSCOPY N/A 02/24/2021   Procedure: ENTEROSCOPY;  Surgeon: [name], MD;  Location: [facility] ENDOSCOPY;  Service: Gastroenterology;  Laterality: N/A;   ESOPHAGOGASTRODUODENOSCOPY N/A 08/10/2020   Procedure: ESOPHAGOGASTRODUODENOSCOPY (EGD);  Surgeon: Jerene Bears, MD;  Location: [facility] ENDOSCOPY;  Service: Gastroenterology;  Laterality: N/A;   ESOPHAGOGASTRODUODENOSCOPY (EGD) WITH PROPOFOL N/A 06/30/2019   Procedure: ESOPHAGOGASTRODUODENOSCOPY (EGD) WITH PROPOFOL;  Surgeon: Ronald Lobo, MD;  Location: [place];  Service: Endoscopy;  Laterality: N/A;   ESOPHAGOGASTRODUODENOSCOPY (EGD) WITH PROPOFOL N/A 01/12/2021   Procedure: ESOPHAGOGASTRODUODENOSCOPY (EGD) WITH PROPOFOL;  Surgeon: Sharyn Creamer, MD;  Location: [place];  Service: Gastroenterology;  Laterality: N/A;   ESOPHAGOGASTRODUODENOSCOPY (EGD) WITH PROPOFOL N/A 02/08/2021   Procedure: ESOPHAGOGASTRODUODENOSCOPY (EGD) WITH PROPOFOL;  Surgeon: Sharyn Creamer, MD;  Location: [place];  Service: Gastroenterology;  Laterality: N/A;   FISTULA SUPERFICIALIZATION Left 10/17/2019   Procedure: LEFT UPPER EXTREMITY FISTULA REVISION, SIDE BRANCH LIGATION,  AND  SUPERFICIALIZATION;  Surgeon: Marty Heck, MD;  Location: [place];  Service: Vascular;  Laterality: Left;   GIVENS CAPSULE STUDY N/A 06/30/2019   Procedure: GIVENS CAPSULE STUDY;  Surgeon: Ronald Lobo, MD;  Location: [place];  Service: Endoscopy;  Laterality: N/A;   HEMOSTASIS CLIP PLACEMENT  08/10/2020   Procedure: HEMOSTASIS CLIP PLACEMENT;  Surgeon: Jerene Bears, MD;  Location: [place];  Service: Gastroenterology;;   HEMOSTASIS CLIP PLACEMENT  01/12/2021   Procedure: HEMOSTASIS CLIP PLACEMENT;  Surgeon: Sharyn Creamer, MD;  Location: [place];  Service: Gastroenterology;;   HEMOSTASIS CONTROL  08/02/2019   Procedure: HEMOSTASIS CONTROL;  Surgeon: Yetta Flock, MD;  Location[place];  Service: Gastroenterology;;   HOT HEMOSTASIS  02/24/2021   Procedure: HOT HEMOSTASIS (ARGON PLASMA COAGULATION/BICAP);  Surgeon: [name], MD;  Location: [facility] ENDOSCOPY;  Service: Gastroenterology;;   INCISION AND DRAINAGE ABSCESS N/A 06/29/2016   Procedure: INCISION AND DRAINAGE ABDOMINAL WALL ABSCESS;  Surgeon: Alphonsa Overall, MD;  Location: [facility];  Service: General;  Laterality: N/A;   INSERTION OF DIALYSIS CATHETER Right 08/03/2019   Procedure: INSERTION OF DIALYSIS CATHETER;  Surgeon: Waynetta Sandy, MD;  Location: [place];  Service: Vascular;  Laterality: Right;   INSERTION OF DIALYSIS CATHETER  Right 10/22/2019   Procedure: INSERTION OF 23CM TUNNELED DIALYSIS CATHETER RIGHT INTERNAL JUGULAR;  Surgeon: Angelia Mould, MD;  Location: [place];  Service: Vascular;  Laterality: Right;   INSERTION OF DIALYSIS CATHETER Right 08/12/2020   Procedure: INSERTION OF Right internal Jugular TUNNELED  DIALYSIS CATHETER.;  Surgeon: Waynetta Sandy, MD;  Location: [place];  Service: Vascular;  Laterality: Right;   Left heel surgery     PENILE BIOPSY N/A 03/26/2020   Procedure: PENILE ULCER DEBRIDEMENT;  Surgeon: Remi Haggard, MD;  Location: [facility];  Service:  Urology;  Laterality: N/A;  30 MINS   SCLEROTHERAPY  01/12/2021   Procedure: SCLEROTHERAPY;  Surgeon: Sharyn Creamer, MD;  Location: [facility] ENDOSCOPY;  Service: Gastroenterology;;   Clide Deutscher  02/24/2021   Procedure: Clide Deutscher;  Surgeon: [name], MD;  Location: [facility] ENDOSCOPY;  Service: Gastroenterology;;       Family History  Problem Relation Age of Onset   Colon cancer Neg Hx     Social History   Tobacco Use   Smoking status: Every Day    Packs/day: 1.00    Years: 30.00    Pack years: 30.00    Types: Cigarettes   Smokeless tobacco: Never  Vaping Use   Vaping Use: Never used  Substance Use Topics   Alcohol use: Not Currently    Alcohol/week: 12.0 standard drinks    Types: 12 Cans of beer per week   Drug use: Not Currently    Types: "Crack" cocaine    Comment: last in 2020    Home Medications Prior to Admission medications   Medication Sig Start Date End Date Taking? Authorizing Provider  amLODipine (NORVASC) 10 MG tablet Take 1 tablet (10 mg total) by mouth at bedtime. 03/23/21   [name], NP  blood glucose meter kit and supplies KIT Dispense based on patient and insurance preference. Use up to four times daily as directed. (FOR ICD-9 250.00, 250.01). 07/01/16   Nita Sells, MD  calcium acetate (PHOSLO) 667 MG capsule Take 1,334 mg by mouth 3 (three) times daily with meals. 03/14/21   [provider]  cloNIDine (CATAPRES) 0.1 MG tablet Take 0.1 mg by mouth 2 (two) times daily. 03/13/21   [provider]  cloNIDine (CATAPRES) 0.2 MG tablet Take  1 tablet twice daily . Patient not taking: Reported on 03/19/2021 10/28/20   Donnamae Jude, MD  doxepin (SINEQUAN) 10 MG capsule Take 10 mg by mouth at bedtime. 01/03/21   [provider]  gabapentin (NEURONTIN) 300 MG capsule Take 1 capsule (300 mg total) by mouth 2 (two) times daily. 03/19/21   Isla Pence, MD  HYDROcodone-acetaminophen (NORCO/VICODIN) 5-325 MG tablet  Take 1 tablet by mouth every 4 (four) hours as needed. 03/19/21   Isla Pence, MD  hydrOXYzine (ATARAX/VISTARIL) 25 MG tablet TAKE 1 TABLET BY MOUTH EVERY 8 HOURS AS NEEDED FOR ANXIETY OR  ITCHING Patient taking differently: Take 25 mg by mouth in the morning and at bedtime. 11/07/20   [name], NP  lidocaine-prilocaine (EMLA) cream Apply 1 application topically once. 12/27/20   [provider]  pantoprazole (PROTONIX) 40 MG tablet Take 1 tablet (40 mg total) by mouth 2 (two) times daily. 02/28/21   Charlynne Cousins, MD  colchicine 0.6 MG tablet Take 0.5 tablets (0.3 mg total) by mouth 2 (two) times daily. 07/24/20 07/29/20  Noemi Chapel, MD  ferrous sulfate 325 (65 FE) MG tablet SMARTSIG:1 Tablet(s) By Mouth Twice Daily 08/11/19  02/09/20  [provider]  furosemide (LASIX) 40 MG tablet Take 1 tablet (40 mg total) by mouth daily. 07/25/19 02/09/20  Ladona Horns, MD    Allergies    Dilaudid [hydromorphone hcl]  Review of Systems   Review of Systems  Musculoskeletal:  Positive for myalgias.  Neurological:  Positive for dizziness and weakness.  Psychiatric/Behavioral:  Positive for confusion.   All other systems reviewed and are negative.  Physical Exam Updated Vital Signs BP 123/82   Pulse 79   Temp 97.7 F (36.5 C) (Oral)   Resp 20   Ht $R'5\' 11"'WA$  (1.803 m)   Wt 117.9 kg   SpO2 98%   BMI 36.26 kg/m   Physical Exam Vitals and nursing note reviewed. Exam conducted with a chaperone present.  Constitutional:      General: He is not in acute distress.    Appearance: Normal appearance.     Comments: Chronically ill  HENT:     Head: Normocephalic and atraumatic.  Eyes:     Extraocular Movements: Extraocular movements intact.     Conjunctiva/sclera: Conjunctivae normal.     Pupils: Pupils are equal, round, and reactive to light.  Cardiovascular:     Rate and Rhythm: Normal rate and regular rhythm.     Pulses: Normal pulses.  Pulmonary:     Effort:  Pulmonary effort is normal. No respiratory distress.     Breath sounds: Normal breath sounds. No wheezing.     Comments: Speaking in full sentences.  Clear lung sounds in all fields. Abdominal:     General: There is no distension.     Palpations: Abdomen is soft. There is no mass.     Tenderness: There is no abdominal tenderness. There is no guarding or rebound.  Genitourinary:    Comments: No melena.  Hemoccult positive. Musculoskeletal:     Cervical back: Normal range of motion and neck supple.     Right lower leg: Edema present.     Left lower leg: Edema present.     Comments: Bilateral edema.  Skin color changes of the left, appears chronic.  Pedal pulses present bilaterally.  Skin:    General: Skin is warm and dry.     Capillary Refill: Capillary refill takes less than 2 seconds.     Comments: No tremors or arm spasms noted on my exam.  Neurological:     Mental Status: He is alert and oriented to person, place, and time.  Psychiatric:        Mood and Affect: Mood and affect normal.        Speech: Speech normal.        Behavior: Behavior normal.    ED Results / Procedures / Treatments   Labs (all labs ordered are listed, but only abnormal results are displayed) Labs Reviewed  CBC WITH DIFFERENTIAL/PLATELET - Abnormal; Notable for the following components:      Result Value   RBC 2.03 (*)    Hemoglobin 6.8 (*)    HCT 21.4 (*)    MCV 105.4 (*)    RDW 19.0 (*)    Platelets 103 (*)    nRBC 0.6 (*)    All other components within normal limits  COMPREHENSIVE METABOLIC PANEL - Abnormal; Notable for the following components:   Glucose, Bld 111 (*)    BUN 40 (*)    Creatinine, Ser 11.59 (*)    Calcium 8.6 (*)    Albumin 2.1 (*)    AST 51 (*)  GFR, Estimated 5 (*)    Anion gap 17 (*)    All other components within normal limits  POC OCCULT BLOOD, ED - Abnormal; Notable for the following components:   Fecal Occult Bld POSITIVE (*)    All other components within normal  limits  RESP PANEL BY RT-PCR (FLU A&B, COVID) ARPGX2  MAGNESIUM  URINALYSIS, ROUTINE W REFLEX MICROSCOPIC  RAPID URINE DRUG SCREEN, HOSP PERFORMED  AMMONIA  TSH  CBC  CBC  CBC  PROTIME-INR  ETHANOL  TYPE AND SCREEN  PREPARE RBC (CROSSMATCH)    EKG None  Radiology DG Chest 2 View  Result Date: 03/24/2021 CLINICAL DATA:  Weakness.  Seizures. EXAM: CHEST - 2 VIEW COMPARISON:  Radiographs 03/17/2021 and CT 06/29/2019. FINDINGS: Stable cardiomegaly and aortic atherosclerosis. There is vascular congestion without overt pulmonary edema, confluent airspace opacity, pneumothorax or significant pleural effusion. Mild degenerative changes are present within the spine. Telemetry leads overlie the chest. Vascular stent noted overlying the left shoulder. IMPRESSION: Cardiomegaly with chronic vascular congestion. No acute findings identified. Electronically Signed   By: Richardean Sale M.D.   On: 03/24/2021 12:30    Procedures .Critical Care Performed by: Franchot Heidelberg, PA-C Authorized by: Franchot Heidelberg, PA-C   Critical care provider statement:    Critical care time (minutes):  35   Critical care time was exclusive of:  Separately billable procedures and treating other patients and teaching time   Critical care was necessary to treat or prevent imminent or life-threatening deterioration of the following conditions:  Circulatory failure   Critical care was time spent personally by me on the following activities:  Blood draw for specimens, development of treatment plan with patient or surrogate, evaluation of patient's response to treatment, examination of patient, obtaining history from patient or surrogate, ordering and performing treatments and interventions, ordering and review of laboratory studies, ordering and review of radiographic studies, pulse oximetry, re-evaluation of patient's condition and review of old charts   I assumed direction of critical care for this patient from  another provider in my specialty: no     Care discussed with: admitting provider     Medications Ordered in ED Medications  cyclobenzaprine (FLEXERIL) tablet 5 mg (has no administration in time range)  insulin aspart (novoLOG) injection 0-6 Units (has no administration in time range)  pantoprazole (PROTONIX) injection 40 mg (has no administration in time range)  sodium chloride flush (NS) 0.9 % injection 3 mL (has no administration in time range)  sodium chloride flush (NS) 0.9 % injection 3 mL (has no administration in time range)  0.9 %  sodium chloride infusion (has no administration in time range)  acetaminophen (TYLENOL) tablet 650 mg (has no administration in time range)    Or  acetaminophen (TYLENOL) suppository 650 mg (has no administration in time range)  multivitamin with minerals tablet 1 tablet (has no administration in time range)  thiamine tablet 100 mg (has no administration in time range)  nicotine (NICODERM CQ - dosed in mg/24 hours) patch 21 mg (has no administration in time range)  LORazepam (ATIVAN) tablet 1-4 mg (has no administration in time range)    Or  LORazepam (ATIVAN) injection 1-4 mg (has no administration in time range)  folic acid (FOLVITE) tablet 1 mg (has no administration in time range)  acetaminophen (TYLENOL) tablet 650 mg (650 mg Oral Given 03/24/21 1111)  0.9 %  sodium chloride infusion (Manually program via Guardrails IV Fluids) ( Intravenous New Bag/Given 03/24/21 1327)  ED Course  I have reviewed the triage vital signs and the nursing notes.  Pertinent labs & imaging results that were available during my care of the patient were reviewed by me and considered in my medical decision making (see chart for details).   MDM Rules/Calculators/A&P                           Patient presenting for evaluation of weakness and confusion.  On exam, patient appears nontoxic.  He is not obtunded and no focal weakness.  He is also complaining about leg  pain, which is chronic and unchanged for him.  Labs obtained from triage interpreted by me, shows worsening anemia.  This is likely contributing to his confusion and weakness.  This is also likely contributing to his spasms.  No obvious infection, rectal temperature normal, doubt infection as cause for his sxs.  We will add on chest x-ray to ensure no pneumonia.  Rectal exam without gross melena, however Hemoccult was positive.  In the setting of recent GI bleed requiring 7 units transfusion over the course of his hospital stay less than a month ago, I am concerned about patient's positive Hemoccult and downtrending hemoglobin.  He will need admission for further management.  Chest x-ray viewed and independently interpreted by me, no pneumonia, pnx, effusion. Blood transfusion ordered.   Discussed with Dr. Rogers Blocker from [facility] hospital service, patient admitted  Final Clinical Impression(s) / ED Diagnoses Final diagnoses:  Acute on chronic anemia  Weakness    Rx / DC Orders ED Discharge Orders     None        Franchot Heidelberg, PA-C 03/24/21 1448    Godfrey Pick, MD 03/26/21 [phone]

## 2021-03-24 NOTE — H&P (Addendum)
History and Physical    Daniel [name] WCH:[id number] DOB: Jan 06, 1961 DOA: 03/24/2021  PCP: [name], NP Consultants:  nephrology: [facility]  Patient coming from:  Home - lives alone   Chief Complaint: weakness and confusion   HPI: Daniel [name] is a 60 y.o. male with medical history significant of  ESRD on HD TTS, remote alcohol and cocaine use, known history of peptic ulcer-most recent EGD on 10/14 with no bleeding and then small bowel enteroscopy on 02/24/21 where he was found to have 3 AVM, one actively bleeding. Hemostasis obtained with epinephrine and the other 2 non bleeding AVMs were treated with APC coagulation without epinephrine, T2DM, anemia, hepatitis C who presented to ED with one weak history of progressive weakness, confusion and new "involuntary jerking" of his upper extremities. He also states that his legs are swollen, but near baseline. He denies any blood in his stool. He states due to his weakness it's hard to even walk.   He does report having visual hallucinations that started 4-7 days ago where he will see people in his house. They do not scare him and he knows that they are not there. He states he saw someone in his bedroom and another time there were lots of people sitting with funny hats on.   Denies any fevers/chills, headaches, vision changes, chest pain, palpitations, shortness of breath, cough, stomach pain, N/V/D, dysuria. No change in chronic skin rash.    ED Course: vitals: temperature: 99.7, HR: 88, BP: 104/53, RR: 20, oxygen: 97% RA Pertinent labs: hgb: 6.8, (8.1 13 days ago), platelets 103, BUN: 40, creatinine: 11.59, AST: 51, AG: 17, fecal occult: positive CXR: chronic vascular congestion. Cardiomegaly. No acute findings.  In Ed: 1 unit of PRBC ordered to transfuse and TRH was asked to admit.   Review of Systems: As per HPI; otherwise review of systems reviewed and negative.   Ambulatory Status:  Ambulates without assistance  walker?    Past Medical History:  Diagnosis Date   Anemia    Diabetes mellitus without complication [facility])    patient denies   Dialysis patient [facility])    End stage chronic kidney disease ([place])    Hypertension    ICH (intracerebral hemorrhage) ([place]) 05/20/2017   Shoulder pain, left 06/28/2013    Past Surgical History:  Procedure Laterality Date   A/V FISTULAGRAM N/A 08/15/2020   Procedure: A/V FISTULAGRAM - Left Upper;  Surgeon: Cherre Robins, MD;  Location: [place] CV LAB;  Service: Cardiovascular;  Laterality: N/A;   APPENDECTOMY     AV FISTULA PLACEMENT Left 08/03/2019   Procedure: LEFT ARM ARTERIOVENOUS (AV) CEPHALIC  FISTULA CREATION;  Surgeon: Waynetta Sandy, MD;  Location: [place];  Service: Vascular;  Laterality: Left;   BIOPSY  06/30/2019   Procedure: BIOPSY;  Surgeon: Ronald Lobo, MD;  Location: [place];  Service: Endoscopy;;   BIOPSY  08/02/2019   Procedure: BIOPSY;  Surgeon: Yetta Flock, MD;  Location: [facility] ENDOSCOPY;  Service: Gastroenterology;;   BIOPSY  02/08/2021   Procedure: BIOPSY;  Surgeon: Sharyn Creamer, MD;  Location: [place];  Service: Gastroenterology;;   BIOPSY  02/24/2021   Procedure: BIOPSY;  Surgeon: [name], MD;  Location: [place];  Service: Gastroenterology;;   COLONOSCOPY  01/23/2012   Procedure: COLONOSCOPY;  Surgeon: Danie Binder, MD;  [facility];  Service: Endoscopy;  Laterality: N/A;  11:10 AM   COLONOSCOPY WITH PROPOFOL N/A 06/30/2019   Procedure: COLONOSCOPY  WITH PROPOFOL;  Surgeon: Ronald Lobo, MD;  Location: [place];  Service: Endoscopy;  Laterality: N/A;   ENTEROSCOPY N/A 08/02/2019   Procedure: ENTEROSCOPY;  Surgeon: Yetta Flock, MD;  Location: [facility] ENDOSCOPY;  Service: Gastroenterology;  Laterality: N/A;   ENTEROSCOPY N/A 02/24/2021   Procedure: ENTEROSCOPY;  Surgeon: [name], MD;  Location: [facility] ENDOSCOPY;  Service: Gastroenterology;  Laterality: N/A;    ESOPHAGOGASTRODUODENOSCOPY N/A 08/10/2020   Procedure: ESOPHAGOGASTRODUODENOSCOPY (EGD);  Surgeon: Jerene Bears, MD;  Location: [facility] ENDOSCOPY;  Service: Gastroenterology;  Laterality: N/A;   ESOPHAGOGASTRODUODENOSCOPY (EGD) WITH PROPOFOL N/A 06/30/2019   Procedure: ESOPHAGOGASTRODUODENOSCOPY (EGD) WITH PROPOFOL;  Surgeon: Ronald Lobo, MD;  Location: [place];  Service: Endoscopy;  Laterality: N/A;   ESOPHAGOGASTRODUODENOSCOPY (EGD) WITH PROPOFOL N/A 01/12/2021   Procedure: ESOPHAGOGASTRODUODENOSCOPY (EGD) WITH PROPOFOL;  Surgeon: Sharyn Creamer, MD;  Location: [place];  Service: Gastroenterology;  Laterality: N/A;   ESOPHAGOGASTRODUODENOSCOPY (EGD) WITH PROPOFOL N/A 02/08/2021   Procedure: ESOPHAGOGASTRODUODENOSCOPY (EGD) WITH PROPOFOL;  Surgeon: Sharyn Creamer, MD;  Location: [place];  Service: Gastroenterology;  Laterality: N/A;   FISTULA SUPERFICIALIZATION Left 10/17/2019   Procedure: LEFT UPPER EXTREMITY FISTULA REVISION, SIDE BRANCH LIGATION,  AND SUPERFICIALIZATION;  Surgeon: Marty Heck, MD;  Location: [place];  Service: Vascular;  Laterality: Left;   GIVENS CAPSULE STUDY N/A 06/30/2019   Procedure: GIVENS CAPSULE STUDY;  Surgeon: Ronald Lobo, MD;  Location: [place];  Service: Endoscopy;  Laterality: N/A;   HEMOSTASIS CLIP PLACEMENT  08/10/2020   Procedure: HEMOSTASIS CLIP PLACEMENT;  Surgeon: Jerene Bears, MD;  Location: [place];  Service: Gastroenterology;;   HEMOSTASIS CLIP PLACEMENT  01/12/2021   Procedure: HEMOSTASIS CLIP PLACEMENT;  Surgeon: Sharyn Creamer, MD;  Location: [employer];  Service: Gastroenterology;;   HEMOSTASIS CONTROL  08/02/2019   Procedure: HEMOSTASIS CONTROL;  Surgeon: Yetta Flock, MD;  Location: [place];  Service: Gastroenterology;;   HOT HEMOSTASIS  02/24/2021   Procedure: HOT HEMOSTASIS (ARGON PLASMA COAGULATION/BICAP);  Surgeon: [name], MD;  Location: [facility] ENDOSCOPY;  Service: Gastroenterology;;   INCISION  AND DRAINAGE ABSCESS N/A 06/29/2016   Procedure: INCISION AND DRAINAGE ABDOMINAL WALL ABSCESS;  Surgeon: Alphonsa Overall, MD;  Location: [facility];  Service: General;  Laterality: N/A;   INSERTION OF DIALYSIS CATHETER Right 08/03/2019   Procedure: INSERTION OF DIALYSIS CATHETER;  Surgeon: Waynetta Sandy, MD;  Location: [place];  Service: Vascular;  Laterality: Right;   INSERTION OF DIALYSIS CATHETER Right 10/22/2019   Procedure: INSERTION OF 23CM TUNNELED DIALYSIS CATHETER RIGHT INTERNAL JUGULAR;  Surgeon: Angelia Mould, MD;  Location: [place];  Service: Vascular;  Laterality: Right;   INSERTION OF DIALYSIS CATHETER Right 08/12/2020   Procedure: INSERTION OF Right internal Jugular TUNNELED  DIALYSIS CATHETER.;  Surgeon: Waynetta Sandy, MD;  Location: [place];  Service: Vascular;  Laterality: Right;   Left heel surgery     PENILE BIOPSY N/A 03/26/2020   Procedure: PENILE ULCER DEBRIDEMENT;  Surgeon: Remi Haggard, MD;  Location: [facility];  Service: Urology;  Laterality: N/A;  30 MINS   SCLEROTHERAPY  01/12/2021   Procedure: SCLEROTHERAPY;  Surgeon: Sharyn Creamer, MD;  Location: [facility] ENDOSCOPY;  Service: Gastroenterology;;   Clide Deutscher  02/24/2021   Procedure: Clide Deutscher;  Surgeon: [name], MD;  Location: [facility] ENDOSCOPY;  Service: Gastroenterology;;    Social History   Socioeconomic History   Marital status: Single    Spouse name: Not on file   Number of children: Not on  file   Years of education: Not on file   Highest education level: Not on file  Occupational History   Not on file  Tobacco Use   Smoking status: Every Day    Packs/day: 1.00    Years: 30.00    Pack years: 30.00    Types: Cigarettes   Smokeless tobacco: Never  Vaping Use   Vaping Use: Never used  Substance and Sexual Activity   Alcohol use: Not Currently    Alcohol/week: 12.0 standard drinks    Types: 12 Cans of beer per week   Drug use: Not Currently    Types: "Crack" cocaine     Comment: last in 2020   Sexual activity: Yes    Birth control/protection: None  Other Topics Concern   Not on file  Social History Narrative   Not on file   Social Determinants of Health   Financial Resource Strain: Not on file  Food Insecurity: Not on file  Transportation Needs: Not on file  Physical Activity: Not on file  Stress: Not on file  Social Connections: Not on file  Intimate Partner Violence: Not on file    Allergies  Allergen Reactions   Dilaudid [Hydromorphone Hcl] Itching and Other (See Comments)    Pt reports itchiness after IM injection     Family History  Problem Relation Age of Onset   Colon cancer Neg Hx     Prior to Admission medications   Medication Sig Start Date End Date Taking? Authorizing Provider  amLODipine (NORVASC) 10 MG tablet Take 1 tablet (10 mg total) by mouth at bedtime. 03/23/21   [name], NP  blood glucose meter kit and supplies KIT Dispense based on patient and insurance preference. Use up to four times daily as directed. (FOR ICD-9 250.00, 250.01). 07/01/16   Nita Sells, MD  calcium acetate (PHOSLO) 667 MG capsule Take 1,334 mg by mouth 3 (three) times daily with meals. 03/14/21   [provider]  cloNIDine (CATAPRES) 0.1 MG tablet Take 0.1 mg by mouth 2 (two) times daily. 03/13/21   [provider]  cloNIDine (CATAPRES) 0.2 MG tablet Take  1 tablet twice daily . Patient not taking: Reported on 03/19/2021 10/28/20   Donnamae Jude, MD  doxepin (SINEQUAN) 10 MG capsule Take 10 mg by mouth at bedtime. 01/03/21   [provider]  gabapentin (NEURONTIN) 300 MG capsule Take 1 capsule (300 mg total) by mouth 2 (two) times daily. 03/19/21   Isla Pence, MD  HYDROcodone-acetaminophen (NORCO/VICODIN) 5-325 MG tablet Take 1 tablet by mouth every 4 (four) hours as needed. 03/19/21   Isla Pence, MD  hydrOXYzine (ATARAX/VISTARIL) 25 MG tablet TAKE 1 TABLET BY MOUTH EVERY 8 HOURS AS NEEDED FOR ANXIETY  OR  ITCHING Patient taking differently: Take 25 mg by mouth in the morning and at bedtime. 11/07/20   [name], NP  lidocaine-prilocaine (EMLA) cream Apply 1 application topically once. 12/27/20   [provider]  pantoprazole (PROTONIX) 40 MG tablet Take 1 tablet (40 mg total) by mouth 2 (two) times daily. 02/28/21   Charlynne Cousins, MD  colchicine 0.6 MG tablet Take 0.5 tablets (0.3 mg total) by mouth 2 (two) times daily. 07/24/20 07/29/20  Noemi Chapel, MD  ferrous sulfate 325 (65 FE) MG tablet SMARTSIG:1 Tablet(s) By Mouth Twice Daily 08/11/19 02/09/20  [provider]  furosemide (LASIX) 40 MG tablet Take 1 tablet (40 mg total) by mouth daily. 07/25/19 02/09/20  Ladona Horns, MD  Physical Exam: Vitals:   03/24/21 1125 03/24/21 1130 03/24/21 1200 03/24/21 1230  BP:  (!) 103/93 116/71 131/72  Pulse:  77 77 83  Resp:  15 (!) 21 11  Temp: 97.7 F (36.5 C)     TempSrc: Rectal     SpO2:  99% 97% 98%  Weight:      Height:         General:  Appears calm and comfortable and is in NAD Eyes:  PERRL, EOMI, normal lids, iris. Chalazion on right upper eyelid. Swelling of upper lid.  ENT:  grossly normal hearing, lips & tongue, mmm; appropriate dentition Neck:  no LAD, masses or thyromegaly; no carotid bruits Cardiovascular:  RRR, no m/r/g. Bilateral LE edema, 3+ pitting to above ankle.   Respiratory:   CTA bilaterally with no wheezes/rales/rhonchi.  Normal respiratory effort. Abdomen:  soft, NT but protuberant, ND, NABS Back:   normal alignment, no CVAT Skin:  scabbed macular rash over extremities and trunk > on extremities.  Musculoskeletal:  grossly normal tone BUE/BLE, good ROM, no bony abnormality Lower extremity:  Limited foot exam with no ulcerations.  2+ distal pulses. Psychiatric:  grossly normal mood and affect, speech fluent and appropriate, Aox3. Denies current VH/AH  Neurologic:  CN 2-12 grossly intact, moves all extremities in coordinated fashion,  sensation intact. Finger to nose intact bilaterally. Gait deferred     Radiological Exams on Admission: Independently reviewed - see discussion in A/P where applicable  DG Chest 2 View  Result Date: 03/24/2021 CLINICAL DATA:  Weakness.  Seizures. EXAM: CHEST - 2 VIEW COMPARISON:  Radiographs 03/17/2021 and CT 06/29/2019. FINDINGS: Stable cardiomegaly and aortic atherosclerosis. There is vascular congestion without overt pulmonary edema, confluent airspace opacity, pneumothorax or significant pleural effusion. Mild degenerative changes are present within the spine. Telemetry leads overlie the chest. Vascular stent noted overlying the left shoulder. IMPRESSION: Cardiomegaly with chronic vascular congestion. No acute findings identified. Electronically Signed   By: Richardean Sale M.D.   On: 03/24/2021 12:30    EKG: Independently reviewed.  NSR with rate 79; nonspecific ST changes with no evidence of acute ischemia. Borderline prolonged qt. Similar to previous tracings.    Labs on Admission: I have personally reviewed the available labs and imaging studies at the time of the admission.  Pertinent labs:  hgb: 6.8, (8.1 13 days ago),  platelets 103,  BUN: 40,  creatinine: 11.59,  AST: 51,  AG: 17,  fecal occult: positive    Assessment/Plan Upper GI bleed with symptomatic anemia in setting of known PUD and AVM of small bowel with hemorrhage -60 year old male with known PUD/AVM with hemorrhage presenting with worsening weakness x 1 week and +fecal occult with hemoglobin to 6.8 -admit to progressive -receiving one unit PRBC. 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  Mild inflammation with friability and granularity in the stomach.  As well as a 2 mm nonbleeding gastric ulcer in the gastric antrum. Plan to repeat EGD in 8 weeks -recent  small bowel enteroscopy on 02/24/21: 3 AVM in duodenum with one actively bleeding.   Symptomatic anemia on chronic anemia  -Likely secondary to known peptic ulcer disease/AVM -Transfused 1 unit packed red blood cells and will check serial H&H's, transfuse to keep hemoglobin greater than 7. -baseline hgb: 7.8-8.2  Thrombocytopenia-chronic  -question if due to  chronic hep c, but typically not this low, baseline 120-130 -continue to monitor  -check INR   Visual hallucinations Psychiatry consult, not actively hallucinating. No harm to self or others. Does not need a sitter at this time.  -hold gabapentin -try for UDS  -he is alert and oriented on my exam with no signs of confusion or neurological deficit, but complains of "talking out of his head at times."  -check ammonia/TSH. Recent B12 wnl.  -no signs of infection, monitor CBC/fever curve.  -check head CT   Involuntary jerking movements -not really reproducible, finger to nose intact -? If more FMD vs. Gabapentin. Holding.  -check TSH, recent B12 wnl -trial flexeril prn -if continues, neurology outpatient w/u   Right upper lid chalazion -x3+ week history -will try warm compresses, but will need opthalmology if still present in 1-2 weeks  Worsening lower extremity in setting of significant PVD and dialysis noncompliance  -bilateral LE doppler negative for DVT on 11/22 -CTA aortobifemoral done on 11/22: no acute occulusion, but stenosis in right external iliac artery  and tandem stenoses in bilateral superficial femoral arteries of possible hemodynamic significant.  -has seen vascular outpatient 08/2020 recommended compressions and he had triphasic waveforms at level of ankle bilaterally.  -recommend f;u with vascular outpatient for possible consideration of angiography if he has worsening edema despite adequate volume control with dialysis      ESRD on hemodialysis [facility]) -He has dialysis on Tuesday/Thursdays and Saturdays. After  discussion with nephrology has only had 4 hours of dialysis over past week -Nephrology has been consulted -no signs of volume overload (LE edema seems to be at baseline)  -intake/output.    Transaminitis-chronic  Chronic. Likely secondary to drinking and chronic HCV. Drinks a beer "every now and then" -acute hep panel checked in 12/2020. Reactive HCV ab -[place]: 10/22-cholelithiasis with associated nonspecific gallbladder wall thickening. Negative murphy sign on exam.      DM2 (diabetes mellitus, type 2) (HCC) A1c: 5.4 in 09/2020 Diet controlled Accuchecks and sensitive SSI per protocol      Essential hypertension -some soft bp reads. MAR not done, will write for PRN hydralazine    History of alcohol abuse and cocaine abuse -remote hx  -UDS pending in setting of confusion/hallucinations  -MV, thiamine, folic acid  -no hx of withdrawals, but CIWA protocol placed.      Chronic hepatitis C without hepatic coma (HCC) -stable     Nicotine dependence, cigarettes, uncomplicated Nicotine patch and encouraged to quit.   Skin rash Characteristic of perforating folliculitis    Generalized weakness  -PT eval for weakness   SW consult-? Homelessness  MAR will need to be completed when has been done   Body mass index is 36.26 kg/m.   Level of care:  DVT prophylaxis:  SCDs Code Status:  Full - confirmed with patient Family Communication: None present; I spoke to his brother, Josephina Gip, by phone at time of admit: [phone] Disposition Plan:  The patient is from: home  Anticipated d/c is to: home   Requires inpatient hospitalization and is at significant risk of worsening, requires constant monitoring, blood transfusion, assessment and MDM with specialists.    Patient is currently: stable Consults called: GI and nephrology/PT and psychiatry  Admission status:  inpatient    Orma Flaming MD [facility]   How to contact the [facility] Attending or Consulting provider [place] or covering provider during after hours [place], for this patient?  Check the care team  in [facility] and look for a) attending/consulting [facility] provider listed and b) the [facility] team listed Log into [email or web address] and use [place]'s universal password to access. If you do not have the password, please contact the hospital operator. Locate the [facility] provider you are looking for under [facility] and page to a number that you can be directly reached. If you still have difficulty reaching the provider, please page the [facility] (Director on Call) for the Hospitalists listed on amion for assistance.   03/24/2021, 1:04 PM

## 2021-03-24 NOTE — Consult Note (Signed)
[facility] Renal Consultation Note    Indication for Consultation:  Management of ESRD/hemodialysis; anemia, hypertension/volume and secondary hyperparathyroidism PCP: [name] NP Nephrologist: Dr. Moshe Cipro  HPI: Daniel [name] is a 60 y.o. male with ESRD on hemodialysis T,Th, S at [facility]. PMH: DMT2, HTN, Calciphylaxis,GIB, Hepatitis C, ETOH abuse, medical noncompliance, homelessness. He has had 33 ED visits and 6 hospitalizations since 04/2020. Last HD 03/23/2021 ran 3.38 hrs, has had total of 4.7 hours of dialysis in past week. Left 7.5 kg above OP EDW 03/23/2021;  He presented to ED today with C.O weakness, confusion and "jerking" of upper extremities. C/O visual hallucinations.HGB 6.8. WBC 5.2 PLT 103 SCr 11.59 BUN 40 CO2 23. CXR unremarkable. He is being admitted with UGIB, GI has been consulted. No ESA in [place] since 03/02/2021 D/T early sign offs/missed HD.   Seen in ED. Alert, no different than he usually appears in HD clinic. No myoclonus. Lying flat, denies SOB. Denies bloody stools, no abdominal pain. C/O weakness in legs but had 3+ BLE edema and is currently 16.5 kg above OP EDW.  Past Medical History:  Diagnosis Date   Anemia    Diabetes mellitus without complication [facility])    patient denies   Dialysis patient [facility])    End stage chronic kidney disease ([place])    Hypertension    ICH (intracerebral hemorrhage) ([place]) 05/20/2017   Shoulder pain, left 06/28/2013   Past Surgical History:  Procedure Laterality Date   A/V FISTULAGRAM N/A 08/15/2020   Procedure: A/V FISTULAGRAM - Left Upper;  Surgeon: Cherre Robins, MD;  Location: [place] CV LAB;  Service: Cardiovascular;  Laterality: N/A;   APPENDECTOMY     AV FISTULA PLACEMENT Left 08/03/2019   Procedure: LEFT ARM ARTERIOVENOUS (AV) CEPHALIC  FISTULA CREATION;  Surgeon: Waynetta Sandy, MD;  Location: [place];  Service: Vascular;  Laterality: Left;   BIOPSY   06/30/2019   Procedure: BIOPSY;  Surgeon: Ronald Lobo, MD;  Location: [place];  Service: Endoscopy;;   BIOPSY  08/02/2019   Procedure: BIOPSY;  Surgeon: Yetta Flock, MD;  Location: [facility] ENDOSCOPY;  Service: Gastroenterology;;   BIOPSY  02/08/2021   Procedure: BIOPSY;  Surgeon: Sharyn Creamer, MD;  Location: [place];  Service: Gastroenterology;;   BIOPSY  02/24/2021   Procedure: BIOPSY;  Surgeon: [name], MD;  Location: [place];  Service: Gastroenterology;;   COLONOSCOPY  01/23/2012   Procedure: COLONOSCOPY;  Surgeon: Danie Binder, MD;  [facility];  Service: Endoscopy;  Laterality: N/A;  11:10 AM   COLONOSCOPY WITH PROPOFOL N/A 06/30/2019   Procedure: COLONOSCOPY WITH PROPOFOL;  Surgeon: Ronald Lobo, MD;  Location: [place];  Service: Endoscopy;  Laterality: N/A;   ENTEROSCOPY N/A 08/02/2019   Procedure: ENTEROSCOPY;  Surgeon: Yetta Flock, MD;  Location: [facility] ENDOSCOPY;  Service: Gastroenterology;  Laterality: N/A;   ENTEROSCOPY N/A 02/24/2021   Procedure: ENTEROSCOPY;  Surgeon: [name], MD;  Location: [facility] ENDOSCOPY;  Service: Gastroenterology;  Laterality: N/A;   ESOPHAGOGASTRODUODENOSCOPY N/A 08/10/2020   Procedure: ESOPHAGOGASTRODUODENOSCOPY (EGD);  Surgeon: Jerene Bears, MD;  Location: [facility] ENDOSCOPY;  Service: Gastroenterology;  Laterality: N/A;   ESOPHAGOGASTRODUODENOSCOPY (EGD) WITH PROPOFOL N/A 06/30/2019   Procedure: ESOPHAGOGASTRODUODENOSCOPY (EGD) WITH PROPOFOL;  Surgeon: Ronald Lobo, MD;  Location: [place];  Service: Endoscopy;  Laterality: N/A;   ESOPHAGOGASTRODUODENOSCOPY (EGD) WITH PROPOFOL N/A 01/12/2021   Procedure: ESOPHAGOGASTRODUODENOSCOPY (EGD) WITH PROPOFOL;  Surgeon: Sharyn Creamer, MD;  Location: [facility] ENDOSCOPY;  Service: Gastroenterology;  Laterality: N/A;   ESOPHAGOGASTRODUODENOSCOPY (EGD) WITH PROPOFOL N/A 02/08/2021   Procedure: ESOPHAGOGASTRODUODENOSCOPY (EGD) WITH PROPOFOL;  Surgeon: Sharyn Creamer, MD;  Location: [place];  Service: Gastroenterology;  Laterality: N/A;   FISTULA SUPERFICIALIZATION Left 10/17/2019   Procedure: LEFT UPPER EXTREMITY FISTULA REVISION, SIDE BRANCH LIGATION,  AND SUPERFICIALIZATION;  Surgeon: Marty Heck, MD;  Location: [place];  Service: Vascular;  Laterality: Left;   GIVENS CAPSULE STUDY N/A 06/30/2019   Procedure: GIVENS CAPSULE STUDY;  Surgeon: Ronald Lobo, MD;  Location: [place];  Service: Endoscopy;  Laterality: N/A;   HEMOSTASIS CLIP PLACEMENT  08/10/2020   Procedure: HEMOSTASIS CLIP PLACEMENT;  Surgeon: Jerene Bears, MD;  Location: [place];  Service: Gastroenterology;;   HEMOSTASIS CLIP PLACEMENT  01/12/2021   Procedure: HEMOSTASIS CLIP PLACEMENT;  Surgeon: Sharyn Creamer, MD;  Location: [place];  Service: Gastroenterology;;   HEMOSTASIS CONTROL  08/02/2019   Procedure: HEMOSTASIS CONTROL;  Surgeon: Yetta Flock, MD;  Location: [place];  Service: Gastroenterology;;   HOT HEMOSTASIS  02/24/2021   Procedure: HOT HEMOSTASIS (ARGON PLASMA COAGULATION/BICAP);  Surgeon: [name], MD;  Location: [facility] ENDOSCOPY;  Service: Gastroenterology;;   INCISION AND DRAINAGE ABSCESS N/A 06/29/2016   Procedure: INCISION AND DRAINAGE ABDOMINAL WALL ABSCESS;  Surgeon: Alphonsa Overall, MD;  Location: [facility];  Service: General;  Laterality: N/A;   INSERTION OF DIALYSIS CATHETER Right 08/03/2019   Procedure: INSERTION OF DIALYSIS CATHETER;  Surgeon: Waynetta Sandy, MD;  Location: [place];  Service: Vascular;  Laterality: Right;   INSERTION OF DIALYSIS CATHETER Right 10/22/2019   Procedure: INSERTION OF 23CM TUNNELED DIALYSIS CATHETER RIGHT INTERNAL JUGULAR;  Surgeon: Angelia Mould, MD;  Location: [place];  Service: Vascular;  Laterality: Right;   INSERTION OF DIALYSIS CATHETER Right 08/12/2020   Procedure: INSERTION OF Right internal Jugular TUNNELED  DIALYSIS CATHETER.;  Surgeon: Waynetta Sandy, MD;   Location: [place];  Service: Vascular;  Laterality: Right;   Left heel surgery     PENILE BIOPSY N/A 03/26/2020   Procedure: PENILE ULCER DEBRIDEMENT;  Surgeon: Remi Haggard, MD;  Location: [facility];  Service: Urology;  Laterality: N/A;  30 MINS   SCLEROTHERAPY  01/12/2021   Procedure: SCLEROTHERAPY;  Surgeon: Sharyn Creamer, MD;  Location: [facility] ENDOSCOPY;  Service: Gastroenterology;;   Clide Deutscher  02/24/2021   Procedure: Clide Deutscher;  Surgeon: [name], MD;  Location: [facility] ENDOSCOPY;  Service: Gastroenterology;;   Family History  Problem Relation Age of Onset   Colon cancer Neg Hx    Social History:  reports that he has been smoking cigarettes. He has a 30.00 pack-year smoking history. He has never used smokeless tobacco. He reports that he does not currently use alcohol after a past usage of about 12.0 standard drinks per week. He reports that he does not currently use drugs after having used the following drugs: "Crack" cocaine. Allergies  Allergen Reactions   Dilaudid [Hydromorphone Hcl] Itching and Other (See Comments)    Pt reports itchiness after IM injection    Prior to Admission medications   Medication Sig Start Date End Date Taking? Authorizing Provider  amLODipine (NORVASC) 10 MG tablet Take 1 tablet (10 mg total) by mouth at bedtime. 03/23/21   [name], NP  blood glucose meter kit and supplies KIT Dispense based on patient and insurance preference. Use up to four times daily as directed. (FOR ICD-9 250.00, 250.01). 07/01/16   Samtani,  Jai-Gurmukh, MD  calcium acetate (PHOSLO) 667 MG capsule Take 1,334 mg by mouth 3 (three) times daily with meals. 03/14/21   [provider]  cloNIDine (CATAPRES) 0.1 MG tablet Take 0.1 mg by mouth 2 (two) times daily. 03/13/21   [provider]  cloNIDine (CATAPRES) 0.2 MG tablet Take  1 tablet twice daily . Patient not taking: Reported on 03/19/2021 10/28/20   Donnamae Jude, MD  doxepin (SINEQUAN) 10 MG  capsule Take 10 mg by mouth at bedtime. 01/03/21   [provider]  gabapentin (NEURONTIN) 300 MG capsule Take 1 capsule (300 mg total) by mouth 2 (two) times daily. 03/19/21   Isla Pence, MD  HYDROcodone-acetaminophen (NORCO/VICODIN) 5-325 MG tablet Take 1 tablet by mouth every 4 (four) hours as needed. 03/19/21   Isla Pence, MD  hydrOXYzine (ATARAX/VISTARIL) 25 MG tablet TAKE 1 TABLET BY MOUTH EVERY 8 HOURS AS NEEDED FOR ANXIETY OR  ITCHING Patient taking differently: Take 25 mg by mouth in the morning and at bedtime. 11/07/20   [name], NP  lidocaine-prilocaine (EMLA) cream Apply 1 application topically once. 12/27/20   [provider]  pantoprazole (PROTONIX) 40 MG tablet Take 1 tablet (40 mg total) by mouth 2 (two) times daily. 02/28/21   Charlynne Cousins, MD  colchicine 0.6 MG tablet Take 0.5 tablets (0.3 mg total) by mouth 2 (two) times daily. 07/24/20 07/29/20  Noemi Chapel, MD  ferrous sulfate 325 (65 FE) MG tablet SMARTSIG:1 Tablet(s) By Mouth Twice Daily 08/11/19 02/09/20  [provider]  furosemide (LASIX) 40 MG tablet Take 1 tablet (40 mg total) by mouth daily. 07/25/19 02/09/20  Ladona Horns, MD   Current Facility-Administered Medications  Medication Dose Route Frequency Provider Last Rate Last Admin   0.9 %  sodium chloride infusion  250 mL Intravenous PRN Orma Flaming, MD       acetaminophen (TYLENOL) tablet 650 mg  650 mg Oral Q6H PRN Orma Flaming, MD       Or   acetaminophen (TYLENOL) suppository 650 mg  650 mg Rectal Q6H PRN Orma Flaming, MD       cyclobenzaprine (FLEXERIL) tablet 5 mg  5 mg Oral TID PRN Orma Flaming, MD       folic acid (FOLVITE) tablet 1 mg  1 mg Oral Daily Orma Flaming, MD       insulin aspart (novoLOG) injection 0-6 Units  0-6 Units Subcutaneous TID WC Orma Flaming, MD       LORazepam (ATIVAN) tablet 1-4 mg  1-4 mg Oral Q1H PRN Orma Flaming, MD       Or   LORazepam (ATIVAN) injection 1-4 mg  1-4 mg  Intravenous Q1H PRN Orma Flaming, MD       multivitamin with minerals tablet 1 tablet  1 tablet Oral Daily Orma Flaming, MD       nicotine (NICODERM CQ - dosed in mg/24 hours) patch 21 mg  21 mg Transdermal Daily Orma Flaming, MD       pantoprazole (PROTONIX) injection 40 mg  40 mg Intravenous Q12H Orma Flaming, MD       sodium chloride flush (NS) 0.9 % injection 3 mL  3 mL Intravenous Q12H Orma Flaming, MD       sodium chloride flush (NS) 0.9 % injection 3 mL  3 mL Intravenous PRN Orma Flaming, MD       thiamine tablet 100 mg  100 mg Oral Daily Orma Flaming, MD       Current  Outpatient Medications  Medication Sig Dispense Refill   amLODipine (NORVASC) 10 MG tablet Take 1 tablet (10 mg total) by mouth at bedtime. 90 tablet 0   blood glucose meter kit and supplies KIT Dispense based on patient and insurance preference. Use up to four times daily as directed. (FOR ICD-9 250.00, 250.01). 1 each 0   calcium acetate (PHOSLO) 667 MG capsule Take 1,334 mg by mouth 3 (three) times daily with meals.     cloNIDine (CATAPRES) 0.1 MG tablet Take 0.1 mg by mouth 2 (two) times daily.     cloNIDine (CATAPRES) 0.2 MG tablet Take  1 tablet twice daily . (Patient not taking: Reported on 03/19/2021) 180 tablet 1   doxepin (SINEQUAN) 10 MG capsule Take 10 mg by mouth at bedtime.     gabapentin (NEURONTIN) 300 MG capsule Take 1 capsule (300 mg total) by mouth 2 (two) times daily. 60 capsule 0   HYDROcodone-acetaminophen (NORCO/VICODIN) 5-325 MG tablet Take 1 tablet by mouth every 4 (four) hours as needed. 10 tablet 0   hydrOXYzine (ATARAX/VISTARIL) 25 MG tablet TAKE 1 TABLET BY MOUTH EVERY 8 HOURS AS NEEDED FOR ANXIETY OR  ITCHING (Patient taking differently: Take 25 mg by mouth in the morning and at bedtime.) 90 tablet 0   lidocaine-prilocaine (EMLA) cream Apply 1 application topically once.     pantoprazole (PROTONIX) 40 MG tablet Take 1 tablet (40 mg total) by mouth 2 (two) times daily. 60 tablet 2    Labs: Basic Metabolic Panel: Recent Labs  Lab 03/18/21 0036 03/19/21 0702 03/24/21 0917  NA 143 148* 142  K 3.4* 3.7 4.3  CL 104 114* 102  CO2 24 22 23   GLUCOSE 94 81 111*  BUN 31* 45* 40*  CREATININE 11.30* 12.52* 11.59*  CALCIUM 8.4* 8.4* 8.6*  PHOS 5.8*  --   --    Liver Function Tests: Recent Labs  Lab 03/18/21 0036 03/24/21 0917  AST 55* 51*  ALT 22 19  ALKPHOS 64 64  BILITOT 0.8 0.5  PROT 7.1 6.8  ALBUMIN 2.3* 2.1*   No results for input(s): LIPASE, AMYLASE in the last 168 hours. No results for input(s): AMMONIA in the last 168 hours. CBC: Recent Labs  Lab 03/18/21 0036 03/19/21 0702 03/24/21 0917  WBC 5.0 4.8 5.2  NEUTROABS 2.4 2.0 2.3  HGB 7.3* 7.0* 6.8*  HCT 23.0* 22.5* 21.4*  MCV 106.5* 108.7* 105.4*  PLT 122* 128* 103*   Cardiac Enzymes: No results for input(s): CKTOTAL, CKMB, CKMBINDEX, TROPONINI in the last 168 hours. CBG: No results for input(s): GLUCAP in the last 168 hours. Iron Studies: No results for input(s): IRON, TIBC, TRANSFERRIN, FERRITIN in the last 72 hours. Studies/Results: DG Chest 2 View  Result Date: 03/24/2021 CLINICAL DATA:  Weakness.  Seizures. EXAM: CHEST - 2 VIEW COMPARISON:  Radiographs 03/17/2021 and CT 06/29/2019. FINDINGS: Stable cardiomegaly and aortic atherosclerosis. There is vascular congestion without overt pulmonary edema, confluent airspace opacity, pneumothorax or significant pleural effusion. Mild degenerative changes are present within the spine. Telemetry leads overlie the chest. Vascular stent noted overlying the left shoulder. IMPRESSION: Cardiomegaly with chronic vascular congestion. No acute findings identified. Electronically Signed   By: Richardean Sale M.D.   On: 03/24/2021 12:30    ROS: As per HPI otherwise negative.  Physical Exam: Vitals:   03/24/21 1320 03/24/21 1330 03/24/21 1345 03/24/21 1353  BP: 114/69 116/83 123/82 123/82  Pulse: 78 78 79 79  Resp: 11 (!) 26 16 20   Temp: 97.8  F (36.6  C)   97.7 F (36.5 C)  TempSrc: Oral   Oral  SpO2: 97% 98% 98% 98%  Weight:      Height:         General: Chronically ill appearing disheveled male in NAD Head: Normocephalic, atraumatic, sclera non-icteric, mucus membranes are moist Neck: Supple. JVD not elevated. Lungs: Clear bilaterally to auscultation without wheezes, rales, or rhonchi. Breathing is unlabored. Heart: RRR with S1 S2. No murmurs, rubs, or gallops appreciated. Abdomen: Obese, protuberant, NT. NABS.  Lower extremities:3+ BLE pitting edema, pedal edema  Neuro: Alert and oriented X 3. Moves all extremities spontaneously. Psych:  Responds to questions appropriately with a normal affect. Dialysis Access: L AVF + T/B  Dialysis Orders: [facility] T,Th,S 4 hrs 450/500 129 kg 2.0K/2.0 Ca AVF -No heparin -Mircera 225 mcg IV q 2 weeks (last dose 03/02/2021) -Venofer 100 mg IV X 10 doses (1/10 doses given) -Hectorol 3 mcg IV TIW  Assessment/Plan:  Symptomatic anemia: HGB 6.8 on adm. Last OP HGB 7.5 03/14/2021, Probably recurrent UGIB but has not had ESA in OP center since 03/02/2021 D/T missed and truncated treatments. GI consulted. PRBCs ordered per primary.   Visual Hallucinations: H/O polysubstance abuse. BUN 43 SCr 11.2. Only 4.7 hr of 12 hours weekly HD rec'd this week. Lack of dialysis probably contributing. Gabapentin dose at OP clinic was 100 mg PO BID. Max Gabapentin dose for ESRD pt is 300 mg PO daily. Has been prescribed carafate by GI in past which was dc'd. Unable to check aluminum level at hospital. Should not be on carafate for longer than 2 weeks D/T risk of aluminum toxicity.   ESRD -  T,Th,S Will order HD tomorrow of schedule as he is severely under dialyzed. No heparin. Short treatment 03/26/2021 to resume T,Th,S schedule.   Hypertension/volume  - BP controlled. CXR unremarkable but 3+ pitting edema by exam. Left 7.5 kg above OP EDW. Lower volume as tolerated. HTN managed by Dr. Clover Mealy. Clonidine 0.1 mg PO BID  hold in AM on HD days, Amlodipine 10 mg PO q HS. Both are on hold D/T soft BP.   Anemia  - As noted above. Give ESA with HD tomorrow. Was getting Fe load at [place] for Tsat 28 03/14/2021. Will continue.   Metabolic bone disease -  Continue VDRA, resume binders when eating.  Nutrition - Clear liquids.  H/O calciphylaxis. Continue sodium thiosulfate. Avoid calcium based medications.  DMT2-per primary  Jimmye Norman. Owens Shark, NP-C 03/24/2021, 2:52 PM  [employer] [phone]

## 2021-03-24 NOTE — ED Provider Notes (Signed)
Emergency Medicine Provider Triage Evaluation Note  Daniel [name] , a 60 y.o. male  was evaluated in triage.  Pt complains of medication reaction.  Review of Systems  Positive: Increase jerky movements, difficulty thinking, leg swelling Negative: Fever, neck stiffness, cold sxs, rash, itch  Physical Exam  BP (!) 104/53 (BP Location: Right Arm)   Pulse 88   Temp 99.7 F (37.6 C) (Oral)   Resp 20   SpO2 97%  Gen:   Awake, no distress   Resp:  Normal effort  MSK:   Moves extremities without difficulty  Other:    Medical Decision Making  Medically screening exam initiated at 9:16 AM.  Appropriate orders placed.  Daniel [name] was informed that the remainder of the evaluation will be completed by another provider, this initial triage assessment does not replace that evaluation, and the importance of remaining in the ED until their evaluation is complete.  Pt recently started on Gabapentin x 1 weeks for neuropathic pain.  He report feeling loopy, having increase involuntary jerking movement to his extremities and notice increase swelling to both legs.  Concerns for gabapentin side effect.    Domenic Moras, PA-C 03/24/21 1027    Godfrey Pick, MD 03/26/21 [phone]

## 2021-03-24 NOTE — Evaluation (Signed)
Physical Therapy Evaluation Patient Details Name: Daniel [name] MRN: [id number] DOB: [birth year] Today's Date: 03/24/2021  History of Present Illness  Pt is a 60 y.o. male who presented 03/24/21 with one week hx of progressive weakness, confusion, visual hallucinations, and new "involuntary jerking" of his upper extremities. Pt admitted with upper GI bleed with symptomatic anemia. CT negative for acute intracranial abnormalities. PMH: anemia, DM2, hepatitis C, HTN, ICH, ESRD on HD TTS, remote alcohol and cocaine use, known history of peptic ulcer   Clinical Impression  Pt presents with condition above and deficits mentioned below, see PT Problem List. PTA, he was living in a "rooming house" with his room accessible via a level entry, but the showers being upstairs. PTA, he was independent for household distance mobility without an AD and continues to state he does not need an AD at d/c. Pt reports he fatigues fairly quickly at baseline. Currently, pt's primary limiting factor causing difficulty with standing and ambulating is his bil ankle and feet edema and pain. Pt is requiring min guard-minA to perform transfers and ambulate short household distances at this time due to deficits in strength, balance, and activity tolerance along with the aforementioned pain. I expect pt to quickly return to baseline once his pain and thus ability to weight bear and balance improve. Pt would benefit from further acute PT services to maximize his return to baseline prior to d/c.     Recommendations for follow up therapy are one component of a multi-disciplinary discharge planning process, led by the attending physician.  Recommendations may be updated based on patient status, additional functional criteria and insurance authorization.  Follow Up Recommendations No PT follow up    Assistance Recommended at Discharge PRN  Functional Status Assessment Patient has had a recent decline in their functional status  and demonstrates the ability to make significant improvements in function in a reasonable and predictable amount of time.  Equipment Recommendations  None recommended by PT (pt stating he does not need any AD)    Recommendations for Other Services       Precautions / Restrictions Precautions Precautions: Fall Restrictions Weight Bearing Restrictions: No      Mobility  Bed Mobility Overal bed mobility: Modified Independent             General bed mobility comments: Pt able to perform all bed mobility aspects without assistance.    Transfers Overall transfer level: Needs assistance Equipment used: None Transfers: Sit to/from Stand Sit to Stand: Min assist;Min guard;From elevated surface           General transfer comment: Pt came to stand from edge of stretcher 2x, first time he displayed difficulty/pain with weight bearing and thus had LOB sitting back down on bed, minA to prevent missing bed. 2nd rep he was able to come to stand with min guard.    Ambulation/Gait Ambulation/Gait assistance: Min guard;Min assist Gait Distance (Feet): 44 Feet Assistive device: None Gait Pattern/deviations: Step-through pattern;Decreased stride length;Wide base of support Gait velocity: reduced Gait velocity interpretation: <1.8 ft/sec, indicate of risk for recurrent falls   General Gait Details: Pt with bil feet edema and pain, resulting in difficulty weight bearing and needing light minA to prevent LOB initially, progressing to min guard assist. Wide BOS and slow pace likely due to pain.  Stairs            Wheelchair Mobility    Modified Rankin (Stroke Patients Only) Modified Rankin (Stroke Patients Only) Pre-Morbid Rankin Score: No  symptoms Modified Rankin: Moderately severe disability     Balance Overall balance assessment: Mild deficits observed, not formally tested (primarily caused by feet pain)                                            Pertinent Vitals/Pain Pain Assessment: Faces Faces Pain Scale: Hurts even more Pain Location: bil feet Pain Descriptors / Indicators: Discomfort;Grimacing;Guarding Pain Intervention(s): Limited activity within patient's tolerance;Monitored during session;Repositioned    Home Living Family/patient expects to be discharged to:: Group home ("rooming house") Living Arrangements: Other (Comment) (others in rooming house)   Type of Home: House Home Access: Level entry     Alternate Level Stairs-Number of Steps: flight Home Layout: Two level;Other (Comment) (shower upstairs) Home Equipment: None      Prior Function Prior Level of Function : Independent/Modified Independent             Mobility Comments: Reports being independent without AD       Hand Dominance   Dominant Hand: Right    Extremity/Trunk Assessment   Upper Extremity Assessment Upper Extremity Assessment: Overall WFL for tasks assessed (no noted "jerking" throughout)    Lower Extremity Assessment Lower Extremity Assessment: RLE deficits/detail;LLE deficits/detail RLE Deficits / Details: Gross functional weakness noted; edema noted with skin peeling at ankles and feet LLE Deficits / Details: Gross functional weakness noted; edema noted with skin peeling at ankles and feet    Cervical / Trunk Assessment Cervical / Trunk Assessment: Normal  Communication   Communication: No difficulties  Cognition Arousal/Alertness: Awake/alert Behavior During Therapy: WFL for tasks assessed/performed Overall Cognitive Status: Within Functional Limits for tasks assessed                                          General Comments      Exercises     Assessment/Plan    PT Assessment Patient needs continued PT services  PT Problem List Decreased strength;Decreased activity tolerance;Decreased balance;Decreased mobility;Decreased skin integrity;Pain       PT Treatment Interventions DME  instruction;Gait training;Stair training;Functional mobility training;Therapeutic activities;Therapeutic exercise;Balance training;Neuromuscular re-education;Patient/family education    PT Goals (Current goals can be found in the Care Plan section)  Acute Rehab PT Goals Patient Stated Goal: to get better PT Goal Formulation: With patient Time For Goal Achievement: 04/07/21 Potential to Achieve Goals: Good    Frequency Min 3X/week   Barriers to discharge        Co-evaluation               AM-PAC PT "6 Clicks" Mobility  Outcome Measure Help needed turning from your back to your side while in a flat bed without using bedrails?: None Help needed moving from lying on your back to sitting on the side of a flat bed without using bedrails?: None Help needed moving to and from a bed to a chair (including a wheelchair)?: A Little Help needed standing up from a chair using your arms (e.g., wheelchair or bedside chair)?: A Little Help needed to walk in hospital room?: A Little Help needed climbing 3-5 steps with a railing? : A Little 6 Click Score: 20    End of Session   Activity Tolerance: Patient limited by pain Patient left: in bed;with call bell/phone within reach  Nurse Communication: Mobility status PT Visit Diagnosis: Unsteadiness on feet (R26.81);Other abnormalities of gait and mobility (R26.89);Muscle weakness (generalized) (M62.81);Difficulty in walking, not elsewhere classified (R26.2);Pain Pain - Right/Left:  (bil) Pain - part of body: Ankle and joints of foot    Time: 1650-1705 PT Time Calculation (min) (ACUTE ONLY): 15 min   Charges:   PT Evaluation $PT Eval Low Complexity: 1 Low          Moishe Spice, PT, DPT Acute Rehabilitation Services  Pager: [phone] Office: [phone]   Orvan Falconer 03/24/2021, 5:19 PM

## 2021-03-24 NOTE — Consult Note (Signed)
Brief Psychiatry Consult Note  Received consult and performed brief chart review. Per chart, pt started on gabapentin about a week ago (around the same time hallucinations and muscle twitches start). Agree with current w/u (B12, TSH, etc). May also wish to consider head imaging if pt with ongoing new hallucinations at 77 if medical w/u otherwise unrevealing. Would recommend holding gabapentin and will assess pt formally tomorrow.   Naz Denunzio A Kiaraliz Rafuse

## 2021-03-25 ENCOUNTER — Inpatient Hospital Stay (HOSPITAL_COMMUNITY): Payer: Medicare Other

## 2021-03-25 DIAGNOSIS — I10 Essential (primary) hypertension: Secondary | ICD-10-CM

## 2021-03-25 DIAGNOSIS — R441 Visual hallucinations: Secondary | ICD-10-CM

## 2021-03-25 DIAGNOSIS — Z720 Tobacco use: Secondary | ICD-10-CM | POA: Diagnosis present

## 2021-03-25 DIAGNOSIS — D696 Thrombocytopenia, unspecified: Secondary | ICD-10-CM

## 2021-03-25 DIAGNOSIS — Z992 Dependence on renal dialysis: Secondary | ICD-10-CM

## 2021-03-25 DIAGNOSIS — F10129 Alcohol abuse with intoxication, unspecified: Secondary | ICD-10-CM | POA: Insufficient documentation

## 2021-03-25 DIAGNOSIS — R531 Weakness: Secondary | ICD-10-CM

## 2021-03-25 DIAGNOSIS — R9431 Abnormal electrocardiogram [ECG] [EKG]: Secondary | ICD-10-CM | POA: Insufficient documentation

## 2021-03-25 DIAGNOSIS — F1410 Cocaine abuse, uncomplicated: Secondary | ICD-10-CM

## 2021-03-25 DIAGNOSIS — M79604 Pain in right leg: Secondary | ICD-10-CM

## 2021-03-25 DIAGNOSIS — N186 End stage renal disease: Secondary | ICD-10-CM | POA: Diagnosis not present

## 2021-03-25 DIAGNOSIS — M79605 Pain in left leg: Secondary | ICD-10-CM

## 2021-03-25 DIAGNOSIS — K31819 Angiodysplasia of stomach and duodenum without bleeding: Secondary | ICD-10-CM | POA: Diagnosis not present

## 2021-03-25 DIAGNOSIS — R600 Localized edema: Secondary | ICD-10-CM

## 2021-03-25 DIAGNOSIS — Z6839 Body mass index (BMI) 39.0-39.9, adult: Secondary | ICD-10-CM | POA: Diagnosis present

## 2021-03-25 DIAGNOSIS — K279 Peptic ulcer, site unspecified, unspecified as acute or chronic, without hemorrhage or perforation: Secondary | ICD-10-CM | POA: Diagnosis present

## 2021-03-25 DIAGNOSIS — D649 Anemia, unspecified: Secondary | ICD-10-CM

## 2021-03-25 DIAGNOSIS — F17200 Nicotine dependence, unspecified, uncomplicated: Secondary | ICD-10-CM | POA: Diagnosis present

## 2021-03-25 DIAGNOSIS — E118 Type 2 diabetes mellitus with unspecified complications: Secondary | ICD-10-CM

## 2021-03-25 DIAGNOSIS — B182 Chronic viral hepatitis C: Secondary | ICD-10-CM

## 2021-03-25 LAB — COMPREHENSIVE METABOLIC PANEL
ALT: 20 U/L (ref 0–44)
ALT: 20 U/L (ref 0–44)
AST: 57 U/L — ABNORMAL HIGH (ref 15–41)
AST: 57 U/L — ABNORMAL HIGH (ref 15–41)
Albumin: 2.2 g/dL — ABNORMAL LOW (ref 3.5–5.0)
Albumin: 2.1 g/dL — ABNORMAL LOW (ref 3.5–5.0)
Alkaline Phosphatase: 62 U/L (ref 38–126)
Alkaline Phosphatase: 64 U/L (ref 38–126)
Anion gap: 15 (ref 5–15)
Anion gap: 17 — ABNORMAL HIGH (ref 5–15)
BUN: 45 mg/dL — ABNORMAL HIGH (ref 8–23)
BUN: 48 mg/dL — ABNORMAL HIGH (ref 8–23)
CO2: 24 mmol/L (ref 22–32)
CO2: 23 mmol/L (ref 22–32)
Calcium: 8.4 mg/dL — ABNORMAL LOW (ref 8.9–10.3)
Calcium: 8.6 mg/dL — ABNORMAL LOW (ref 8.9–10.3)
Chloride: 99 mmol/L (ref 98–111)
Chloride: 102 mmol/L (ref 98–111)
Creatinine, Ser: 12.03 mg/dL — ABNORMAL HIGH (ref 0.61–1.24)
Creatinine, Ser: 12.65 mg/dL — ABNORMAL HIGH (ref 0.61–1.24)
GFR, Estimated: 4 mL/min — ABNORMAL LOW (ref >60)
GFR, Estimated: 4 mL/min — ABNORMAL LOW (ref >60)
Glucose, Bld: 105 mg/dL — ABNORMAL HIGH (ref 70–99)
Glucose, Bld: 100 mg/dL — ABNORMAL HIGH (ref 70–99)
Potassium: 4 mmol/L (ref 3.5–5.1)
Potassium: 4.4 mmol/L (ref 3.5–5.1)
Sodium: 138 mmol/L (ref 135–145)
Sodium: 142 mmol/L (ref 135–145)
Total Bilirubin: 0.7 mg/dL (ref 0.3–1.2)
Total Bilirubin: 0.6 mg/dL (ref 0.3–1.2)
Total Protein: 7.2 g/dL (ref 6.5–8.1)
Total Protein: 7 g/dL (ref 6.5–8.1)

## 2021-03-25 LAB — TYPE AND SCREEN
ABO/RH(D): B POS
Antibody Screen: NEGATIVE
Unit division: 0

## 2021-03-25 LAB — RENAL FUNCTION PANEL
Albumin: 2.1 g/dL — ABNORMAL LOW (ref 3.5–5.0)
Anion gap: 13 (ref 5–15)
BUN: 23 mg/dL (ref 8–23)
CO2: 27 mmol/L (ref 22–32)
Calcium: 8.3 mg/dL — ABNORMAL LOW (ref 8.9–10.3)
Chloride: 98 mmol/L (ref 98–111)
Creatinine, Ser: 7.53 mg/dL — ABNORMAL HIGH (ref 0.61–1.24)
GFR, Estimated: 8 mL/min — ABNORMAL LOW (ref >60)
Glucose, Bld: 97 mg/dL (ref 70–99)
Phosphorus: 3.1 mg/dL (ref 2.5–4.6)
Potassium: 4 mmol/L (ref 3.5–5.1)
Sodium: 138 mmol/L (ref 135–145)

## 2021-03-25 LAB — CBC WITH DIFFERENTIAL/PLATELET
Abs Immature Granulocytes: 0.04 10*3/uL (ref 0.00–0.07)
Basophils Absolute: 0 10*3/uL (ref 0.0–0.1)
Basophils Relative: 0 %
Eosinophils Absolute: 0.1 10*3/uL (ref 0.0–0.5)
Eosinophils Relative: 2 %
HCT: 24.7 % — ABNORMAL LOW (ref 39.0–52.0)
Hemoglobin: 7.9 g/dL — ABNORMAL LOW (ref 13.0–17.0)
Immature Granulocytes: 1 %
Lymphocytes Relative: 39 %
Lymphs Abs: 1.9 10*3/uL (ref 0.7–4.0)
MCH: 32.4 pg (ref 26.0–34.0)
MCHC: 32 g/dL (ref 30.0–36.0)
MCV: 101.2 fL — ABNORMAL HIGH (ref 80.0–100.0)
Monocytes Absolute: 0.4 10*3/uL (ref 0.1–1.0)
Monocytes Relative: 9 %
Neutro Abs: 2.4 10*3/uL (ref 1.7–7.7)
Neutrophils Relative %: 49 %
Platelets: 106 10*3/uL — ABNORMAL LOW (ref 150–400)
RBC: 2.44 MIL/uL — ABNORMAL LOW (ref 4.22–5.81)
RDW: 20.6 % — ABNORMAL HIGH (ref 11.5–15.5)
WBC: 4.9 10*3/uL (ref 4.0–10.5)
nRBC: 0.4 % — ABNORMAL HIGH (ref 0.0–0.2)

## 2021-03-25 LAB — CBC
HCT: 25.5 % — ABNORMAL LOW (ref 39.0–52.0)
Hemoglobin: 8.1 g/dL — ABNORMAL LOW (ref 13.0–17.0)
MCH: 32.3 pg (ref 26.0–34.0)
MCHC: 31.8 g/dL (ref 30.0–36.0)
MCV: 101.6 fL — ABNORMAL HIGH (ref 80.0–100.0)
Platelets: 108 10*3/uL — ABNORMAL LOW (ref 150–400)
RBC: 2.51 MIL/uL — ABNORMAL LOW (ref 4.22–5.81)
RDW: 21 % — ABNORMAL HIGH (ref 11.5–15.5)
WBC: 5.7 10*3/uL (ref 4.0–10.5)
nRBC: 0.5 % — ABNORMAL HIGH (ref 0.0–0.2)

## 2021-03-25 LAB — PHOSPHORUS: Phosphorus: 4.4 mg/dL (ref 2.5–4.6)

## 2021-03-25 LAB — BPAM RBC
Blood Product Expiration Date: 202212122359
ISSUE DATE / TIME: 202211271307
Unit Type and Rh: 7300

## 2021-03-25 LAB — GLUCOSE, CAPILLARY
Glucose-Capillary: 136 mg/dL — ABNORMAL HIGH (ref 70–99)
Glucose-Capillary: 110 mg/dL — ABNORMAL HIGH (ref 70–99)

## 2021-03-25 LAB — MAGNESIUM: Magnesium: 2.4 mg/dL (ref 1.7–2.4)

## 2021-03-25 LAB — GAMMA GT: GGT: 177 U/L — ABNORMAL HIGH (ref 7–50)

## 2021-03-25 MED ORDER — LIDOCAINE-PRILOCAINE 2.5-2.5 % EX CREA: 1.0000 "application " | TOPICAL_CREAM | CUTANEOUS | Status: DC | PRN

## 2021-03-25 MED ORDER — LIDOCAINE HCL (PF) 1 % IJ SOLN: 5.0000 mL | INTRAMUSCULAR | Status: DC | PRN

## 2021-03-25 MED ORDER — SODIUM CHLORIDE 0.9 % IV SOLN: 100.0000 mL | INTRAVENOUS | Status: DC | PRN

## 2021-03-25 MED ORDER — PENTAFLUOROPROP-TETRAFLUOROETH EX AERO: 1.0000 "application " | INHALATION_SPRAY | CUTANEOUS | Status: DC | PRN

## 2021-03-25 MED ORDER — PANTOPRAZOLE SODIUM 40 MG PO TBEC
40.0000 mg | DELAYED_RELEASE_TABLET | Freq: Two times a day (BID) | ORAL | Status: DC
  Administered 2021-03-25 – 2021-03-27 (×4): 40 mg via ORAL
  Filled 2021-03-25 (×4): qty 1

## 2021-03-25 MED ORDER — DIPHENHYDRAMINE-ZINC ACETATE 2-0.1 % EX CREA
TOPICAL_CREAM | Freq: Every day | CUTANEOUS | Status: DC | PRN
  Administered 2021-03-25 – 2021-03-26 (×2): 1 via TOPICAL
  Filled 2021-03-25: qty 28

## 2021-03-25 MED ORDER — HEPARIN SODIUM (PORCINE) 5000 UNIT/ML IJ SOLN
5000.0000 [IU] | Freq: Two times a day (BID) | INTRAMUSCULAR | Status: DC
  Administered 2021-03-25 – 2021-03-27 (×3): 5000 [IU] via SUBCUTANEOUS
  Filled 2021-03-25 (×3): qty 1

## 2021-03-25 MED ORDER — DARBEPOETIN ALFA 200 MCG/0.4ML IJ SOSY
200.0000 ug | PREFILLED_SYRINGE | INTRAMUSCULAR | Status: AC
  Administered 2021-03-25: 200 ug via INTRAVENOUS
  Filled 2021-03-25: qty 0.4

## 2021-03-25 MED ORDER — DARBEPOETIN ALFA 200 MCG/0.4ML IJ SOSY: 200.0000 ug | PREFILLED_SYRINGE | INTRAMUSCULAR | Status: DC

## 2021-03-25 NOTE — Progress Notes (Addendum)
Attending physician's note   I have taken an interval history, reviewed the chart and examined the patient. I agree with the Advanced Practitioner's note, impression, and recommendations as outlined.   No further overt bleeding today.  Completed hemodialysis earlier today.  H/H stable at 7.9/24.7 after 1 unit PRBC transfusion.  1) Acute on chronic anemia/Symptomatic anemia 2) History of bleeding small bowel AVMs 3) History of PUD 4) History of H. 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 at midnight for procedure tomorrow - Push enteroscopy for diagnostic and therapeutic intent tomorrow - May need to consider long-acting octreotide  The indications, risks, and benefits of EGD/Push enteroscopy were explained to the patient in detail. Risks include but are not limited to bleeding, perforation, adverse reaction to medications, and cardiopulmonary compromise. Sequelae include but are not limited to the possibility of surgery, hospitalization, and mortality. The patient verbalized understanding and wished to proceed. All questions answered. Further recommendations pending results of the exam.    Gerrit Heck, DO, FACG [phone] office                Daily Rounding Note  03/25/2021, 12:17 PM  LOS: 1 day   SUBJECTIVE:   Chief complaint:   gibleed and anemia.  recurrent   Stool was brown yesterday, no BM today.  Had been black at time of admission.  Hungry, on clears only as of this AM.  Says he was npo all of yesterday.  No n/v/ab pain.   OBJECTIVE:         Vital signs in last 24 hours:    Temp:  [97.4 F (36.3 C)-97.8 F (36.6 C)] 97.7 F (36.5 C) (11/28 0837) Pulse Rate:  [76-94] 94 (11/28 0930) Resp:  [11-26] 18 (11/28 1200) BP:  (84-141)/(47-98) 120/73 (11/28 1200) SpO2:  [94 %-100 %] 100 % (11/28 0837) Weight:  [135.2 kg-138.7 kg] 138.7 kg (11/28 0837) Last BM Date: 03/25/21 Filed Weights   03/24/21 1034 03/24/21 2235 03/25/21 0837  Weight: 117.9 kg 135.2 kg (!) 138.7 kg   General: looks chronically ill   Heart: RRR Chest: diminished BS, no dyspnea Abdomen: obese, soft, NT.  Extremities: + LE edema Neuro/Psych:  pleasant, cooperative, no confusion.    Intake/Output from previous day: 11/27 0701 - 11/28 0700 In: 375 [P.O.:60; Blood:315] Out: -   Intake/Output this shift: No intake/output data recorded.  Lab Results: Recent Labs    03/24/21 1831 03/25/21 0138 03/25/21 0903  WBC 5.4 5.7 4.9  HGB 8.1* 8.1* 7.9*  HCT 26.2* 25.5* 24.7*  PLT 107* 108* 106*   BMET Recent Labs    03/24/21 0917 03/25/21 0138 03/25/21 0903  NA 142 138 142  K 4.3 4.0 4.4  CL 102 99 102  CO2 23 24 23   GLUCOSE 111* 105* 100*  BUN 40* 45* 48*  CREATININE 11.59* 12.03* 12.65*  CALCIUM 8.6* 8.4* 8.6*   LFT Recent Labs    03/24/21 0917 03/25/21 0138 03/25/21 0903  PROT 6.8 7.2 7.0  ALBUMIN 2.1* 2.2* 2.1*  AST 51* 57* 57*  ALT 19 20 20   ALKPHOS 64 62 64  BILITOT 0.5 0.7 0.6   PT/INR Recent Labs    03/24/21 1831  LABPROT 12.7  INR 1.0   Hepatitis Panel No results for input(s): HEPBSAG, HCVAB, HEPAIGM, HEPBIGM in the last 72  hours.  Studies/Results: DG Chest 2 View  Result Date: 03/24/2021 CLINICAL DATA:  Weakness.  Seizures. EXAM: CHEST - 2 VIEW COMPARISON:  Radiographs 03/17/2021 and CT 06/29/2019. FINDINGS: Stable cardiomegaly and aortic atherosclerosis. There is vascular congestion without overt pulmonary edema, confluent airspace opacity, pneumothorax or significant pleural effusion. Mild degenerative changes are present within the spine. Telemetry leads overlie the chest. Vascular stent noted overlying the left shoulder. IMPRESSION: Cardiomegaly with chronic vascular congestion. No acute findings  identified. Electronically Signed   By: Richardean Sale M.D.   On: 03/24/2021 12:30   CT HEAD WO CONTRAST (5MM)  Result Date: 03/24/2021 CLINICAL DATA:  Seizures with increased twitching and hallucinations. Recent medication change. EXAM: CT HEAD WITHOUT CONTRAST TECHNIQUE: Contiguous axial images were obtained from the base of the skull through the vertex without intravenous contrast. COMPARISON:  CT head 01/10/2021 FINDINGS: Brain: There is no evidence of acute intracranial hemorrhage, mass lesion, brain edema or extra-axial fluid collection. Stable mild atrophy with prominence of the ventricles and subarachnoid spaces. Stable mild chronic small vessel ischemic changes in the periventricular white matter. There is no CT evidence of acute cortical infarction. Vascular: Prominent intracranial vascular calcifications. No hyperdense vessel identified. Skull: Negative for fracture or focal lesion. Sinuses/Orbits: The visualized paranasal sinuses and mastoid air cells are clear. No orbital abnormalities are seen. Other: None. IMPRESSION: Stable head CT.  No acute intracranial findings. Electronically Signed   By: Richardean Sale M.D.   On: 03/24/2021 15:41    Scheduled Meds:  Chlorhexidine Gluconate Cloth  6 each Topical Q0600   darbepoetin (ARANESP) injection - DIALYSIS  200 mcg Intravenous Q Mon-HD   doxercalciferol  3 mcg Intravenous Q T,Th,Sa-HD   folic acid  1 mg Oral Daily   insulin aspart  0-6 Units Subcutaneous TID WC   multivitamin with minerals  1 tablet Oral Daily   nicotine  21 mg Transdermal Daily   pantoprazole (PROTONIX) IV  40 mg Intravenous Q12H   sodium chloride flush  3 mL Intravenous Q12H   thiamine  100 mg Oral Daily   Continuous Infusions:  sodium chloride     sodium chloride     sodium chloride     ferric gluconate (FERRLECIT) IVPB     sodium thiosulfate infusion for calciphylaxis     PRN Meds:.sodium chloride, sodium chloride, sodium chloride, acetaminophen **OR**  acetaminophen, cyclobenzaprine, diphenhydrAMINE-zinc acetate, hydrALAZINE, lidocaine (PF), lidocaine-prilocaine, LORazepam **OR** LORazepam, pentafluoroprop-tetrafluoroeth, sodium chloride flush   ASSESMENT:   Recurrent anemia, FOBT positive stools.  In past treated for H. pylori/peptic ulcer disease.  SBE late October 2022 for melena/anemia.  This showed gastritis, healed gastric ulcer, duodenal polyps, duodenal nodule, 1 of 3 duodenal AVMs was actively bleeding this was treated with epinephrine and APC.  The other nonbleeding AVMs were treated with APC.  Hemoclip from prior procedure at the duodenal bulb.  Hgb 6.8 >> 1 PRBC >> 7.9     ESRD.  HD MWF    Volume overload   PLAN     Repeat SBE?, timing?  I advanced to renal/carb mod diet, NPO after midnight incase of SBE tomorrow.  Change over to oral PPI bid.      Azucena Freed  03/25/2021, 12:17 PM Phone [phone]

## 2021-03-25 NOTE — Consult Note (Signed)
[facility] Face-to-Face Psychiatry Consult   Reason for Consult: Visual hallucinations Referring Physician: Orma Flaming, MD Patient Identification: Daniel [name] MRN:  [id number] Principal Diagnosis: <principal problem not specified> Diagnosis:  Active Problems:   Essential hypertension   DM2 (diabetes mellitus, type 2) ([place])   Chronic hepatitis C without hepatic coma ([place])   Acute on chronic anemia   ESRD on hemodialysis (HCC)   Nicotine dependence, cigarettes, uncomplicated   Transaminitis   Generalized weakness   Prolonged QT interval   Upper GI bleed   Chalazion of right upper eyelid   Thrombocytopenia (HCC)   Visual hallucination   Bilateral leg edema  Assessment  Daniel [name] is a 60 y.o. male admitted medically 03/24/2021  9:06 AM for upper GI bleed.Patient carries no history of psychiatric diagnoses and has a past medical history of ESRD on HD TTS, remote alcohol and cocaine use, known history of peptic ulcer .Psychiatry was consulted for reported visual hallucinations by patient.   On initial examination, patient appears very pleasant and endorses that he has been having what sound like visual hallucinations over the past week since starting a new medication, gabapentin. We plan to recommend patient's gabapentin be discontinued patient continued to be monitored.   On assessment today patient endorses that he "sees things when my eyes are closed" which is likely not a visual hallucination as his eyes are closed.  More concerning was that patient endorsed that he was hearing multiple voices and could not make out what they were saying endorsing auditory hallucinations.  However, both of these symptoms appear to have started after patient started his gabapentin.  Patient also endorsed that he was having physical side effects, sudden jerking movements in his extremities and starting gabapentin.  We recommend that this medication be discontinued as it is likely the etiology behind  patient's most recent hallucinations and jerking of body movements.  Notably the recommended dose of gabapentin in a patient with ESRD is half of patient's prescribed amount, it is possible that patient may be having adverse reaction to increase availability of gabapentin.  Would not recommend antipsychotic therapy at this time as psychotic symptoms is likely related to medication; however patient would also be at increased risk for torsades due to his ESRD.  At this time the risk outweighed the benefits of starting antipsychotic therapy, but this can be reconsidered should patient's symptoms worsen.  Also in the differential is MDD with psychotic features however, this is low in the differential as patient does not screen positive for depression in the symptoms started more recently the episode of grief.  However, patient would again benefit from therapy more than an antipsychotic medication at this time.  Medication induced psychotic symptoms - Discontinue gabapentin 300 mg twice daily  Safety At this time patient determined to be a low risk of harm to himself or others.  Recommend patient be on routine observation.  Dispo - Per primary Thank you for this consult we will continue to follow.   Total Time spent with patient: 20 minutes  Subjective:   Daniel [name] is a 60 y.o. male patient admitted with upper GI bleed.  HPI: Patient assessed this a.m. while in HD.  Patient endorsed that he would like to continue assessment while he was in HD.  Patient AAO x4.  Patient reports that he has been "seeing things when I close my eyes."  Patient not able to give details about what he is "seeing" but endorses that it is bizarre.  Patient reports that he does not see these things when his eyes are open.  Patient reports that he has been hearing multiple voices and is not able to discern what they are saying.  Patient reports that both of the symptoms started in the past week after he started  gabapentin.  Patient reports that he has not found the gabapentin helpful for his neuropathic pain and feels like the gabapentin may be the cause of his problems.  Patient denies SI, HI and true VH.  Patient reports that he has been sleeping fairly well and denies feelings of hopelessness, worthlessness and guilt.  Patient endorses that he does believe his appetite has decreased over the past few weeks.  Patient reports that his uncle died approximately 2 or 3 weeks ago and they had previously been living together for the past 8 years.  Patient reports that he does not feel significantly depressed and has been trying to get out and do things however he is significantly less active than he was approximately 4 months ago due to his health.  Patient endorses that he is looking forward to going out more as his health improves.  Past Psychiatric History: None    Past Medical History:  Past Medical History:  Diagnosis Date   Anemia    Diabetes mellitus without complication [facility])    patient denies   Dialysis patient [facility])    End stage chronic kidney disease ([place])    Hypertension    ICH (intracerebral hemorrhage) ([place]) 05/20/2017   Shoulder pain, left 06/28/2013    Past Surgical History:  Procedure Laterality Date   A/V FISTULAGRAM N/A 08/15/2020   Procedure: A/V FISTULAGRAM - Left Upper;  Surgeon: Cherre Robins, MD;  Location: [place] [facility];  Service: Cardiovascular;  Laterality: N/A;   APPENDECTOMY     AV FISTULA PLACEMENT Left 08/03/2019   Procedure: LEFT ARM ARTERIOVENOUS (AV) CEPHALIC  FISTULA CREATION;  Surgeon: Waynetta Sandy, MD;  Location: [place];  Service: Vascular;  Laterality: Left;   BIOPSY  06/30/2019   Procedure: BIOPSY;  Surgeon: Ronald Lobo, MD;  Location: [place];  Service: Endoscopy;;   BIOPSY  08/02/2019   Procedure: BIOPSY;  Surgeon: Yetta Flock, MD;  Location: [facility] ENDOSCOPY;  Service: Gastroenterology;;   BIOPSY  02/08/2021   Procedure: BIOPSY;   Surgeon: Sharyn Creamer, MD;  Location: [place];  Service: Gastroenterology;;   BIOPSY  02/24/2021   Procedure: BIOPSY;  Surgeon: [name], MD;  Location: [place];  Service: Gastroenterology;;   COLONOSCOPY  01/23/2012   Procedure: COLONOSCOPY;  Surgeon: Danie Binder, MD;  [facility];  Service: Endoscopy;  Laterality: N/A;  11:10 AM   COLONOSCOPY WITH PROPOFOL N/A 06/30/2019   Procedure: COLONOSCOPY WITH PROPOFOL;  Surgeon: Ronald Lobo, MD;  Location: [place];  Service: Endoscopy;  Laterality: N/A;   ENTEROSCOPY N/A 08/02/2019   Procedure: ENTEROSCOPY;  Surgeon: Yetta Flock, MD;  Location: [facility] ENDOSCOPY;  Service: Gastroenterology;  Laterality: N/A;   ENTEROSCOPY N/A 02/24/2021   Procedure: ENTEROSCOPY;  Surgeon: [name], MD;  Location: [facility] ENDOSCOPY;  Service: Gastroenterology;  Laterality: N/A;   ESOPHAGOGASTRODUODENOSCOPY N/A 08/10/2020   Procedure: ESOPHAGOGASTRODUODENOSCOPY (EGD);  Surgeon: Jerene Bears, MD;  Location: [facility] ENDOSCOPY;  Service: Gastroenterology;  Laterality: N/A;   ESOPHAGOGASTRODUODENOSCOPY (EGD) WITH PROPOFOL N/A 06/30/2019   Procedure: ESOPHAGOGASTRODUODENOSCOPY (EGD) WITH PROPOFOL;  Surgeon: Ronald Lobo, MD;  Location: [place];  Service: Endoscopy;  Laterality: N/A;   ESOPHAGOGASTRODUODENOSCOPY (  EGD) WITH PROPOFOL N/A 01/12/2021   Procedure: ESOPHAGOGASTRODUODENOSCOPY (EGD) WITH PROPOFOL;  Surgeon: Sharyn Creamer, MD;  Location: [place];  Service: Gastroenterology;  Laterality: N/A;   ESOPHAGOGASTRODUODENOSCOPY (EGD) WITH PROPOFOL N/A 02/08/2021   Procedure: ESOPHAGOGASTRODUODENOSCOPY (EGD) WITH PROPOFOL;  Surgeon: Sharyn Creamer, MD;  Location: [place];  Service: Gastroenterology;  Laterality: N/A;   FISTULA SUPERFICIALIZATION Left 10/17/2019   Procedure: LEFT UPPER EXTREMITY FISTULA REVISION, SIDE BRANCH LIGATION,  AND SUPERFICIALIZATION;  Surgeon: Marty Heck, MD;  Location: [place];   Service: Vascular;  Laterality: Left;   GIVENS CAPSULE STUDY N/A 06/30/2019   Procedure: GIVENS CAPSULE STUDY;  Surgeon: Ronald Lobo, MD;  Location: [place];  Service: Endoscopy;  Laterality: N/A;   HEMOSTASIS CLIP PLACEMENT  08/10/2020   Procedure: HEMOSTASIS CLIP PLACEMENT;  Surgeon: Jerene Bears, MD;  Location: [place];  Service: Gastroenterology;;   HEMOSTASIS CLIP PLACEMENT  01/12/2021   Procedure: HEMOSTASIS CLIP PLACEMENT;  Surgeon: Sharyn Creamer, MD;  Location: [place];  Service: Gastroenterology;;   HEMOSTASIS CONTROL  08/02/2019   Procedure: HEMOSTASIS CONTROL;  Surgeon: Yetta Flock, MD;  Location: [place];  Service: Gastroenterology;;   HOT HEMOSTASIS  02/24/2021   Procedure: HOT HEMOSTASIS (ARGON PLASMA COAGULATION/BICAP);  Surgeon: [name], MD;  Location: [facility] ENDOSCOPY;  Service: Gastroenterology;;   INCISION AND DRAINAGE ABSCESS N/A 06/29/2016   Procedure: INCISION AND DRAINAGE ABDOMINAL WALL ABSCESS;  Surgeon: Alphonsa Overall, MD;  Location: [facility];  Service: General;  Laterality: N/A;   INSERTION OF DIALYSIS CATHETER Right 08/03/2019   Procedure: INSERTION OF DIALYSIS CATHETER;  Surgeon: Waynetta Sandy, MD;  Location: [place];  Service: Vascular;  Laterality: Right;   INSERTION OF DIALYSIS CATHETER Right 10/22/2019   Procedure: INSERTION OF 23CM TUNNELED DIALYSIS CATHETER RIGHT INTERNAL JUGULAR;  Surgeon: Angelia Mould, MD;  Location: [place];  Service: Vascular;  Laterality: Right;   INSERTION OF DIALYSIS CATHETER Right 08/12/2020   Procedure: INSERTION OF Right internal Jugular TUNNELED  DIALYSIS CATHETER.;  Surgeon: Waynetta Sandy, MD;  Location: [place];  Service: Vascular;  Laterality: Right;   Left heel surgery     PENILE BIOPSY N/A 03/26/2020   Procedure: PENILE ULCER DEBRIDEMENT;  Surgeon: Remi Haggard, MD;  [facility];  Service: Urology;  Laterality: N/A;  [place]  01/12/2021   Procedure:  SCLEROTHERAPY;  Surgeon: Sharyn Creamer, MD;  Location: [facility] ENDOSCOPY;  Service: Gastroenterology;;   Clide Deutscher  02/24/2021   Procedure: Clide Deutscher;  Surgeon: [name], MD;  Location: [facility] ENDOSCOPY;  Service: Gastroenterology;;   Family History:  Family History  Problem Relation Age of Onset   Colon cancer Neg Hx    Family Psychiatric  History: None Social History:  Social History   Substance and Sexual Activity  Alcohol Use Not Currently   Alcohol/week: 12.0 standard drinks   Types: 12 Cans of beer per week     Social History   Substance and Sexual Activity  Drug Use Not Currently   Types: "Crack" cocaine   Comment: last in 2020    Social History   Socioeconomic History   Marital status: Single    Spouse name: Not on file   Number of children: Not on file   Years of education: Not on file   Highest education level: Not on file  Occupational History   Not on file  Tobacco Use   Smoking status: Every Day    Packs/day: 1.00  Years: 30.00    Pack years: 30.00    Types: Cigarettes   Smokeless tobacco: Never  Vaping Use   Vaping Use: Never used  Substance and Sexual Activity   Alcohol use: Not Currently    Alcohol/week: 12.0 standard drinks    Types: 12 Cans of beer per week   Drug use: Not Currently    Types: "Crack" cocaine    Comment: last in 2020   Sexual activity: Yes    Birth control/protection: None  Other Topics Concern   Not on file  Social History Narrative   Not on file   Social Determinants of Health   Financial Resource Strain: Not on file  Food Insecurity: Not on file  Transportation Needs: Not on file  Physical Activity: Not on file  Stress: Not on file  Social Connections: Not on file   Additional Social History: Family history:-None  Allergies:   Allergies  Allergen Reactions   Dilaudid [Hydromorphone Hcl] Itching and Other (See Comments)    Pt reports itchiness after IM injection     Labs:  Results for orders  placed or performed during the hospital encounter of 03/24/21 (from the past 48 hour(s))  CBC with Differential     Status: Abnormal   Collection Time: 03/24/21  9:17 AM  Result Value Ref Range   WBC 5.2 4.0 - 10.5 K/uL   RBC 2.03 (L) 4.22 - 5.81 MIL/uL   Hemoglobin 6.8 (LL) 13.0 - 17.0 g/dL    Comment: REPEATED TO VERIFY THIS CRITICAL RESULT HAS VERIFIED AND BEEN CALLED TO C CHAPMAN RN BY BILLEE WYLIE ON 11 27 2022 AT 1042, AND HAS BEEN READ BACK.     HCT 21.4 (L) 39.0 - 52.0 %   MCV 105.4 (H) 80.0 - 100.0 fL   MCH 33.5 26.0 - 34.0 pg   MCHC 31.8 30.0 - 36.0 g/dL   RDW 19.0 (H) 11.5 - 15.5 %   Platelets 103 (L) 150 - 400 K/uL    Comment: Immature Platelet Fraction may be clinically indicated, consider ordering this additional test [id number] REPEATED TO VERIFY PLATELET COUNT CONFIRMED BY SMEAR    nRBC 0.6 (H) 0.0 - 0.2 %   Neutrophils Relative % 44 %   Neutro Abs 2.3 1.7 - 7.7 K/uL   Lymphocytes Relative 43 %   Lymphs Abs 2.2 0.7 - 4.0 K/uL   Monocytes Relative 10 %   Monocytes Absolute 0.5 0.1 - 1.0 K/uL   Eosinophils Relative 2 %   Eosinophils Absolute 0.1 0.0 - 0.5 K/uL   Basophils Relative 0 %   Basophils Absolute 0.0 0.0 - 0.1 K/uL   WBC Morphology MORPHOLOGY UNREMARKABLE    RBC Morphology MORPHOLOGY UNREMARKABLE    Smear Review PLATELETS APPEAR DECREASED    Immature Granulocytes 1 %   Abs Immature Granulocytes 0.03 0.00 - 0.07 K/uL    Comment: Performed at [facility], [address] [place], [place] [address]  Comprehensive metabolic panel     Status: Abnormal   Collection Time: 03/24/21  9:17 AM  Result Value Ref Range   Sodium 142 135 - 145 mmol/L   Potassium 4.3 3.5 - 5.1 mmol/L   Chloride 102 98 - 111 mmol/L   CO2 23 22 - 32 mmol/L   Glucose, Bld 111 (H) 70 - 99 mg/dL    Comment: Glucose reference range applies only to samples taken after fasting for at least 8 hours.   BUN 40 (H) 8 - 23 mg/dL  Creatinine, Ser 11.59 (H) 0.61 - 1.24 mg/dL   Calcium  8.6 (L) 8.9 - 10.3 mg/dL   Total Protein 6.8 6.5 - 8.1 g/dL   Albumin 2.1 (L) 3.5 - 5.0 g/dL   AST 51 (H) 15 - 41 U/L   ALT 19 0 - 44 U/L   Alkaline Phosphatase 64 38 - 126 U/L   Total Bilirubin 0.5 0.3 - 1.2 mg/dL   GFR, Estimated 5 (L) >60 mL/min    Comment: (NOTE) Calculated using the CKD-EPI Creatinine Equation (2021)    Anion gap 17 (H) 5 - 15    Comment: Performed at [facility], [place] [address], [place], [place] [address]  Magnesium     Status: None   Collection Time: 03/24/21 11:24 AM  Result Value Ref Range   Magnesium 2.2 1.7 - 2.4 mg/dL    Comment: Performed at [facility] [address] [place], [place] [address]  POC occult blood, ED     Status: Abnormal   Collection Time: 03/24/21 11:29 AM  Result Value Ref Range   Fecal Occult Bld POSITIVE (A) NEGATIVE  Type and screen [place]     Status: None   Collection Time: 03/24/21 12:01 PM  Result Value Ref Range   ABO/RH(D) B POS    Antibody Screen NEG    Sample Expiration 03/27/2021,2359    Unit Number [id number]    Blood Component Type RED CELLS,LR    Unit division 00    Status of Unit ISSUED,FINAL    Transfusion Status OK TO TRANSFUSE    Crossmatch Result      Compatible Performed at [facility], [place] [address], [place], [place] [address]   Prepare RBC (crossmatch)     Status: None   Collection Time: 03/24/21 12:30 PM  Result Value Ref Range   Order Confirmation      ORDERS RECEIVED TO CROSSMATCH Performed at [facility] [address] [place], [place] [address]   Resp Panel by RT-PCR (Flu A&B, Covid) Nasopharyngeal Swab     Status: None   Collection Time: 03/24/21  1:45 PM   Specimen: Nasopharyngeal Swab; Nasopharyngeal(NP) swabs in vial transport medium  Result Value Ref Range   SARS Coronavirus 2 by RT PCR NEGATIVE NEGATIVE    Comment: (NOTE) SARS-CoV-2 target nucleic acids are NOT DETECTED.  The SARS-CoV-2 RNA is generally detectable in upper  respiratory specimens during the acute phase of infection. The lowest concentration of SARS-CoV-2 viral copies this assay can detect is 138 copies/mL. A negative result does not preclude SARS-Cov-2 infection and should not be used as the sole basis for treatment or other patient management decisions. A negative result may occur with  improper specimen collection/handling, submission of specimen other than nasopharyngeal swab, presence of viral mutation(s) within the areas targeted by this assay, and inadequate number of viral copies(<138 copies/mL). A negative result must be combined with clinical observations, patient history, and epidemiological information. The expected result is Negative.  Fact Sheet for Patients:  EntrepreneurPulse.com.au  Fact Sheet for Healthcare Providers:  IncredibleEmployment.be  This test is no t yet approved or cleared by the [place] FDA and  has been authorized for detection and/or diagnosis of SARS-CoV-2 by FDA under an Emergency Use Authorization (EUA). This EUA will remain  in effect (meaning this test can be used) for the duration of the COVID-19 declaration under Section 564(b)(1) of the Act, 21 U.S.C.section 360bbb-3(b)(1), unless the authorization is  terminated  or revoked sooner.       Influenza A by PCR NEGATIVE NEGATIVE   Influenza B by PCR NEGATIVE NEGATIVE    Comment: (NOTE) The Xpert Xpress SARS-CoV-2/FLU/RSV plus assay is intended as an aid in the diagnosis of influenza from Nasopharyngeal swab specimens and should not be used as a sole basis for treatment. Nasal washings and aspirates are unacceptable for Xpert Xpress SARS-CoV-2/FLU/RSV testing.  Fact Sheet for Patients: EntrepreneurPulse.com.au  Fact Sheet for Healthcare Providers: IncredibleEmployment.be  This test is not yet approved or cleared by the [place] FDA and has been authorized for  detection and/or diagnosis of SARS-CoV-2 by FDA under an Emergency Use Authorization (EUA). This EUA will remain in effect (meaning this test can be used) for the duration of the COVID-19 declaration under Section 564(b)(1) of the Act, 21 U.S.C. section 360bbb-3(b)(1), unless the authorization is terminated or revoked.  Performed at [facility], [place] [address] [place], [place] [address]   Ethanol     Status: None   Collection Time: 03/24/21  3:30 PM  Result Value Ref Range   Alcohol, Ethyl (B) <10 <10 mg/dL    Comment: (NOTE) Lowest detectable limit for serum alcohol is 10 mg/dL.  For medical purposes only. Performed at [facility], [place] [address], [place], [place] [address]   CBG monitoring, ED     Status: None   Collection Time: 03/24/21  4:49 PM  Result Value Ref Range   Glucose-Capillary 88 70 - 99 mg/dL    Comment: Glucose reference range applies only to samples taken after fasting for at least 8 hours.  Ammonia     Status: None   Collection Time: 03/24/21  6:31 PM  Result Value Ref Range   Ammonia 23 9 - 35 umol/L    Comment: Performed at [facility], [place] [address], [place], [place] [address]  TSH     Status: None   Collection Time: 03/24/21  6:31 PM  Result Value Ref Range   TSH 3.465 0.350 - 4.500 uIU/mL    Comment: Performed by a 3rd Generation assay with a functional sensitivity of <=0.01 uIU/mL. Performed at [facility], [place] [address] [place], [place] [address]   CBC     Status: Abnormal   Collection Time: 03/24/21  6:31 PM  Result Value Ref Range   WBC 5.4 4.0 - 10.5 K/uL   RBC 2.53 (L) 4.22 - 5.81 MIL/uL   Hemoglobin 8.1 (L) 13.0 - 17.0 g/dL   HCT 26.2 (L) 39.0 - 52.0 %   MCV 103.6 (H) 80.0 - 100.0 fL   MCH 32.0 26.0 - 34.0 pg   MCHC 30.9 30.0 - 36.0 g/dL   RDW 20.7 (H) 11.5 - 15.5 %   Platelets 107 (L) 150 - 400 K/uL    Comment: Immature Platelet Fraction may be clinically indicated, consider ordering this additional  test [id number] CONSISTENT WITH PREVIOUS RESULT REPEATED TO VERIFY    nRBC 0.6 (H) 0.0 - 0.2 %    Comment: Performed at [facility], [place] [address], [place],  [address]  Protime-INR     Status: None   Collection Time: 03/24/21  6:31 PM  Result Value Ref Range   Prothrombin Time 12.7 11.4 - 15.2 seconds   INR 1.0 0.8 - 1.2    Comment: (NOTE) INR goal varies based on device and disease states. Performed at [facility], [place],  [place] [address]   CBG monitoring, ED     Status: None   Collection Time: 03/24/21  9:39 PM  Result Value Ref Range   Glucose-Capillary 71 70 - 99 mg/dL    Comment: Glucose reference range applies only to samples taken after fasting for at least 8 hours.  CBC     Status: Abnormal   Collection Time: 03/25/21  1:38 AM  Result Value Ref Range   WBC 5.7 4.0 - 10.5 K/uL   RBC 2.51 (L) 4.22 - 5.81 MIL/uL   Hemoglobin 8.1 (L) 13.0 - 17.0 g/dL   HCT 25.5 (L) 39.0 - 52.0 %   MCV 101.6 (H) 80.0 - 100.0 fL   MCH 32.3 26.0 - 34.0 pg   MCHC 31.8 30.0 - 36.0 g/dL   RDW 21.0 (H) 11.5 - 15.5 %   Platelets 108 (L) 150 - 400 K/uL    Comment: Immature Platelet Fraction may be clinically indicated, consider ordering this additional test [id number] CONSISTENT WITH PREVIOUS RESULT REPEATED TO VERIFY    nRBC 0.5 (H) 0.0 - 0.2 %    Comment: Performed at [facility], [place] [address], [place], [place] [address]  Comprehensive metabolic panel     Status: Abnormal   Collection Time: 03/25/21  1:38 AM  Result Value Ref Range   Sodium 138 135 - 145 mmol/L   Potassium 4.0 3.5 - 5.1 mmol/L   Chloride 99 98 - 111 mmol/L   CO2 24 22 - 32 mmol/L   Glucose, Bld 105 (H) 70 - 99 mg/dL    Comment: Glucose reference range applies only to samples taken after fasting for at least 8 hours.   BUN 45 (H) 8 - 23 mg/dL   Creatinine, Ser 12.03 (H) 0.61 - 1.24 mg/dL   Calcium 8.4 (L) 8.9 - 10.3 mg/dL   Total Protein 7.2 6.5 - 8.1 g/dL   Albumin 2.2  (L) 3.5 - 5.0 g/dL   AST 57 (H) 15 - 41 U/L   ALT 20 0 - 44 U/L   Alkaline Phosphatase 62 38 - 126 U/L   Total Bilirubin 0.7 0.3 - 1.2 mg/dL   GFR, Estimated 4 (L) >60 mL/min    Comment: (NOTE) Calculated using the CKD-EPI Creatinine Equation (2021)    Anion gap 15 5 - 15    Comment: Performed at [facility], [place] [address] [place], [place] [address]  Gamma GT     Status: Abnormal   Collection Time: 03/25/21  1:38 AM  Result Value Ref Range   GGT 177 (H) 7 - 50 U/L    Comment: Performed at [facility], [place] [address], [place], [place] [address]  Comprehensive metabolic panel     Status: Abnormal   Collection Time: 03/25/21  9:03 AM  Result Value Ref Range   Sodium 142 135 - 145 mmol/L   Potassium 4.4 3.5 - 5.1 mmol/L   Chloride 102 98 - 111 mmol/L   CO2 23 22 - 32 mmol/L   Glucose, Bld 100 (H) 70 - 99 mg/dL    Comment: Glucose reference range applies only to samples taken after fasting for at least 8 hours.   BUN 48 (H) 8 - 23 mg/dL   Creatinine, Ser 12.65 (H) 0.61 - 1.24 mg/dL   Calcium 8.6 (L) 8.9 - 10.3 mg/dL   Total Protein 7.0 6.5 - 8.1 g/dL   Albumin 2.1 (L) 3.5 - 5.0 g/dL   AST 57 (H) 15 -  41 U/L   ALT 20 0 - 44 U/L   Alkaline Phosphatase 64 38 - 126 U/L   Total Bilirubin 0.6 0.3 - 1.2 mg/dL   GFR, Estimated 4 (L) >60 mL/min    Comment: (NOTE) Calculated using the CKD-EPI Creatinine Equation (2021)    Anion gap 17 (H) 5 - 15    Comment: Performed at [facility] [address] [place], [place] [address]  Magnesium     Status: None   Collection Time: 03/25/21  9:03 AM  Result Value Ref Range   Magnesium 2.4 1.7 - 2.4 mg/dL    Comment: Performed at [facility] [address], [place], [place] [address]  Phosphorus     Status: None   Collection Time: 03/25/21  9:03 AM  Result Value Ref Range   Phosphorus 4.4 2.5 - 4.6 mg/dL    Comment: Performed at [facility] [address] [place], [place] [address]  CBC with  Differential/Platelet     Status: Abnormal   Collection Time: 03/25/21  9:03 AM  Result Value Ref Range   WBC 4.9 4.0 - 10.5 K/uL   RBC 2.44 (L) 4.22 - 5.81 MIL/uL   Hemoglobin 7.9 (L) 13.0 - 17.0 g/dL   HCT 24.7 (L) 39.0 - 52.0 %   MCV 101.2 (H) 80.0 - 100.0 fL   MCH 32.4 26.0 - 34.0 pg   MCHC 32.0 30.0 - 36.0 g/dL   RDW 20.6 (H) 11.5 - 15.5 %   Platelets 106 (L) 150 - 400 K/uL    Comment: Immature Platelet Fraction may be clinically indicated, consider ordering this additional test [id number] CONSISTENT WITH PREVIOUS RESULT REPEATED TO VERIFY    nRBC 0.4 (H) 0.0 - 0.2 %   Neutrophils Relative % 49 %   Neutro Abs 2.4 1.7 - 7.7 K/uL   Lymphocytes Relative 39 %   Lymphs Abs 1.9 0.7 - 4.0 K/uL   Monocytes Relative 9 %   Monocytes Absolute 0.4 0.1 - 1.0 K/uL   Eosinophils Relative 2 %   Eosinophils Absolute 0.1 0.0 - 0.5 K/uL   Basophils Relative 0 %   Basophils Absolute 0.0 0.0 - 0.1 K/uL   Immature Granulocytes 1 %   Abs Immature Granulocytes 0.04 0.00 - 0.07 K/uL    Comment: Performed at [facility], [facility] [address], [place], [place] [address]    Current Facility-Administered Medications  Medication Dose Route Frequency Provider Last Rate Last Admin   0.9 %  sodium chloride infusion  250 mL Intravenous PRN Orma Flaming, MD       acetaminophen (TYLENOL) tablet 650 mg  650 mg Oral Q6H PRN Orma Flaming, MD   650 mg at 03/25/21 1149   Or   acetaminophen (TYLENOL) suppository 650 mg  650 mg Rectal Q6H PRN Orma Flaming, MD       Chlorhexidine Gluconate Cloth 2 % PADS 6 each  6 each Topical Q0600 Valentina Gu, NP       cyclobenzaprine (FLEXERIL) tablet 5 mg  5 mg Oral TID PRN Orma Flaming, MD   5 mg at 03/24/21 2137   diphenhydrAMINE-zinc acetate (BENADRYL) 2-0.1 % cream   Topical Daily PRN Kristopher Oppenheim, DO       doxercalciferol (HECTOROL) injection 3 mcg  3 mcg Intravenous Q T,Th,Sa-HD Valentina Gu, NP       ferric gluconate (FERRLECIT) 125 mg in  sodium chloride 0.9 % 100 mL IVPB  125 mg  Intravenous Q T,Th,Sa-HD Valentina Gu, NP       folic acid (FOLVITE) tablet 1 mg  1 mg Oral Daily Orma Flaming, MD       hydrALAZINE (APRESOLINE) injection 10 mg  10 mg Intravenous Q8H PRN Orma Flaming, MD       insulin aspart (novoLOG) injection 0-6 Units  0-6 Units Subcutaneous TID WC Orma Flaming, MD       LORazepam (ATIVAN) tablet 1-4 mg  1-4 mg Oral Q1H PRN Orma Flaming, MD       Or   LORazepam (ATIVAN) injection 1-4 mg  1-4 mg Intravenous Q1H PRN Orma Flaming, MD       multivitamin with minerals tablet 1 tablet  1 tablet Oral Daily Orma Flaming, MD       nicotine (NICODERM CQ - dosed in mg/24 hours) patch 21 mg  21 mg Transdermal Daily Orma Flaming, MD   21 mg at 03/24/21 1710   pantoprazole (PROTONIX) EC tablet 40 mg  40 mg Oral BID Vena Rua, PA-C       sodium chloride flush (NS) 0.9 % injection 3 mL  3 mL Intravenous Q12H Orma Flaming, MD       sodium chloride flush (NS) 0.9 % injection 3 mL  3 mL Intravenous PRN Orma Flaming, MD       sodium thiosulfate 25 g in sodium chloride 0.9 % 200 mL Infusion for Calciphylaxis  25 g Intravenous Q T,Th,Sa-HD Valentina Gu, NP       thiamine tablet 100 mg  100 mg Oral Daily Orma Flaming, MD        Psychiatric Specialty Exam:  Presentation  General Appearance: Appropriate for Environment  Eye Contact:Good  Speech:Clear and Coherent  Speech Volume:Normal  Handedness:No data recorded  Mood and Affect  Mood:Euthymic  Affect:Congruent; Appropriate   Thought Process  Thought Processes:Coherent  Descriptions of Associations:Intact  Orientation:Full (Time, Place and Person)  Thought Content:Logical  History of Schizophrenia/Schizoaffective disorder:No data recorded Duration of Psychotic Symptoms:No data recorded Hallucinations:Hallucinations: Auditory Description of Auditory Hallucinations: "I hear mumbling when I close my eyes"  Ideas of  Reference:None  Suicidal Thoughts:Suicidal Thoughts: No  Homicidal Thoughts:Homicidal Thoughts: No   Sensorium  Memory:Immediate Good; Recent Good; Remote Good  Judgment:Fair  Insight:Fair   Executive Functions  Concentration:Good  Attention Span:Good  Recall:No data recorded Fund of Knowledge:Good  Language:Good   Psychomotor Activity  Psychomotor Activity:Psychomotor Activity: Normal   Assets  Assets:Resilience; Data processing manager; Housing; Communication Skills   Sleep  Sleep:Sleep: Fair   Physical Exam: Physical Exam HENT:     Head: Normocephalic and atraumatic.  Eyes:     Extraocular Movements: Extraocular movements intact.  Pulmonary:     Effort: Pulmonary effort is normal.  Skin:    General: Skin is dry.  Neurological:     Mental Status: He is alert and oriented to person, place, and time.   Review of Systems  Psychiatric/Behavioral:  Positive for hallucinations. Negative for suicidal ideas.   Blood pressure (P) 121/85, pulse 94, temperature 97.7 F (36.5 C), resp. rate (P) 11, height 5\' 11"  (1.803 m), weight (!) 138.7 kg, SpO2 100 %. Body mass index is 42.65 kg/m.  PGY-2  Freida Busman, MD 03/25/2021 2:41 PM

## 2021-03-25 NOTE — H&P (View-Only) (Signed)
Attending physician's note   I have taken an interval history, reviewed the chart and examined the patient. I agree with the Advanced Practitioner's note, impression, and recommendations as outlined.   No further overt bleeding today.  Completed hemodialysis earlier today.  H/H stable at 7.9/24.7 after 1 unit PRBC transfusion.  1) Acute on chronic anemia/Symptomatic anemia 2) History of bleeding small bowel AVMs 3) History of PUD 4) History of H. 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 at midnight for procedure tomorrow - Push enteroscopy for diagnostic and therapeutic intent tomorrow - May need to consider long-acting octreotide  The indications, risks, and benefits of EGD/Push enteroscopy were explained to the patient in detail. Risks include but are not limited to bleeding, perforation, adverse reaction to medications, and cardiopulmonary compromise. Sequelae include but are not limited to the possibility of surgery, hospitalization, and mortality. The patient verbalized understanding and wished to proceed. All questions answered. Further recommendations pending results of the exam.    Gerrit Heck, DO, FACG [phone] office                Daily Rounding Note  03/25/2021, 12:17 PM  LOS: 1 day   SUBJECTIVE:   Chief complaint:   gibleed and anemia.  recurrent   Stool was brown yesterday, no BM today.  Had been black at time of admission.  Hungry, on clears only as of this AM.  Says he was npo all of yesterday.  No n/v/ab pain.   OBJECTIVE:         Vital signs in last 24 hours:    Temp:  [97.4 F (36.3 C)-97.8 F (36.6 C)] 97.7 F (36.5 C) (11/28 0837) Pulse Rate:  [76-94] 94 (11/28 0930) Resp:  [11-26] 18 (11/28 1200) BP:  (84-141)/(47-98) 120/73 (11/28 1200) SpO2:  [94 %-100 %] 100 % (11/28 0837) Weight:  [135.2 kg-138.7 kg] 138.7 kg (11/28 0837) Last BM Date: 03/25/21 Filed Weights   03/24/21 1034 03/24/21 2235 03/25/21 0837  Weight: 117.9 kg 135.2 kg (!) 138.7 kg   General: looks chronically ill   Heart: RRR Chest: diminished BS, no dyspnea Abdomen: obese, soft, NT.  Extremities: + LE edema Neuro/Psych:  pleasant, cooperative, no confusion.    Intake/Output from previous day: 11/27 0701 - 11/28 0700 In: 375 [P.O.:60; Blood:315] Out: -   Intake/Output this shift: No intake/output data recorded.  Lab Results: Recent Labs    03/24/21 1831 03/25/21 0138 03/25/21 0903  WBC 5.4 5.7 4.9  HGB 8.1* 8.1* 7.9*  HCT 26.2* 25.5* 24.7*  PLT 107* 108* 106*   BMET Recent Labs    03/24/21 0917 03/25/21 0138 03/25/21 0903  NA 142 138 142  K 4.3 4.0 4.4  CL 102 99 102  CO2 23 24 23   GLUCOSE 111* 105* 100*  BUN 40* 45* 48*  CREATININE 11.59* 12.03* 12.65*  CALCIUM 8.6* 8.4* 8.6*   LFT Recent Labs    03/24/21 0917 03/25/21 0138 03/25/21 0903  PROT 6.8 7.2 7.0  ALBUMIN 2.1* 2.2* 2.1*  AST 51* 57* 57*  ALT 19 20 20   ALKPHOS 64 62 64  BILITOT 0.5 0.7 0.6   PT/INR Recent Labs    03/24/21 1831  LABPROT 12.7  INR 1.0   Hepatitis Panel No results for input(s): HEPBSAG, HCVAB, HEPAIGM, HEPBIGM in the last 72  hours.  Studies/Results: DG Chest 2 View  Result Date: 03/24/2021 CLINICAL DATA:  Weakness.  Seizures. EXAM: CHEST - 2 VIEW COMPARISON:  Radiographs 03/17/2021 and CT 06/29/2019. FINDINGS: Stable cardiomegaly and aortic atherosclerosis. There is vascular congestion without overt pulmonary edema, confluent airspace opacity, pneumothorax or significant pleural effusion. Mild degenerative changes are present within the spine. Telemetry leads overlie the chest. Vascular stent noted overlying the left shoulder. IMPRESSION: Cardiomegaly with chronic vascular congestion. No acute findings  identified. Electronically Signed   By: Richardean Sale M.D.   On: 03/24/2021 12:30   CT HEAD WO CONTRAST (5MM)  Result Date: 03/24/2021 CLINICAL DATA:  Seizures with increased twitching and hallucinations. Recent medication change. EXAM: CT HEAD WITHOUT CONTRAST TECHNIQUE: Contiguous axial images were obtained from the base of the skull through the vertex without intravenous contrast. COMPARISON:  CT head 01/10/2021 FINDINGS: Brain: There is no evidence of acute intracranial hemorrhage, mass lesion, brain edema or extra-axial fluid collection. Stable mild atrophy with prominence of the ventricles and subarachnoid spaces. Stable mild chronic small vessel ischemic changes in the periventricular white matter. There is no CT evidence of acute cortical infarction. Vascular: Prominent intracranial vascular calcifications. No hyperdense vessel identified. Skull: Negative for fracture or focal lesion. Sinuses/Orbits: The visualized paranasal sinuses and mastoid air cells are clear. No orbital abnormalities are seen. Other: None. IMPRESSION: Stable head CT.  No acute intracranial findings. Electronically Signed   By: Richardean Sale M.D.   On: 03/24/2021 15:41    Scheduled Meds:  Chlorhexidine Gluconate Cloth  6 each Topical Q0600   darbepoetin (ARANESP) injection - DIALYSIS  200 mcg Intravenous Q Mon-HD   doxercalciferol  3 mcg Intravenous Q T,Th,Sa-HD   folic acid  1 mg Oral Daily   insulin aspart  0-6 Units Subcutaneous TID WC   multivitamin with minerals  1 tablet Oral Daily   nicotine  21 mg Transdermal Daily   pantoprazole (PROTONIX) IV  40 mg Intravenous Q12H   sodium chloride flush  3 mL Intravenous Q12H   thiamine  100 mg Oral Daily   Continuous Infusions:  sodium chloride     sodium chloride     sodium chloride     ferric gluconate (FERRLECIT) IVPB     sodium thiosulfate infusion for calciphylaxis     PRN Meds:.sodium chloride, sodium chloride, sodium chloride, acetaminophen **OR**  acetaminophen, cyclobenzaprine, diphenhydrAMINE-zinc acetate, hydrALAZINE, lidocaine (PF), lidocaine-prilocaine, LORazepam **OR** LORazepam, pentafluoroprop-tetrafluoroeth, sodium chloride flush   ASSESMENT:   Recurrent anemia, FOBT positive stools.  In past treated for H. pylori/peptic ulcer disease.  SBE late October 2022 for melena/anemia.  This showed gastritis, healed gastric ulcer, duodenal polyps, duodenal nodule, 1 of 3 duodenal AVMs was actively bleeding this was treated with epinephrine and APC.  The other nonbleeding AVMs were treated with APC.  Hemoclip from prior procedure at the duodenal bulb.  Hgb 6.8 >> 1 PRBC >> 7.9     ESRD.  HD MWF    Volume overload   PLAN     Repeat SBE?, timing?  I advanced to renal/carb mod diet, NPO after midnight incase of SBE tomorrow.  Change over to oral PPI bid.      Azucena Freed  03/25/2021, 12:17 PM Phone [phone]

## 2021-03-25 NOTE — Progress Notes (Signed)
ANTICOAGULATION CONSULT NOTE - Initial Consult  Pharmacy Consult for Heparin Indication: VTE prophylaxis  Allergies  Allergen Reactions   Dilaudid [Hydromorphone Hcl] Itching and Other (See Comments)    Pt reports itchiness after IM injection     Patient Measurements: Height: 5\' 11"  (180.3 cm) Weight: (!) 138.7 kg (305 lb 12.5 oz) IBW/kg (Calculated) : 75.3   Vital Signs: Temp: 97.9 F (36.6 C) (11/28 1651) Temp Source: Oral (11/28 1651) BP: 122/78 (11/28 1651) Pulse Rate: 81 (11/28 1700)  Labs: Recent Labs    03/24/21 0917 03/24/21 1831 03/25/21 0138 03/25/21 0903  HGB 6.8* 8.1* 8.1* 7.9*  HCT 21.4* 26.2* 25.5* 24.7*  PLT 103* 107* 108* 106*  LABPROT  --  12.7  --   --   INR  --  1.0  --   --   CREATININE 11.59*  --  12.03* 12.65*    Estimated Creatinine Clearance: 8.7 mL/min (A) (by C-G formula based on SCr of 12.65 mg/dL (H)).   Medical History: Past Medical History:  Diagnosis Date   Anemia    Diabetes mellitus without complication [facility])    patient denies   Dialysis patient [facility])    End stage chronic kidney disease ([place])    Hypertension    ICH (intracerebral hemorrhage) ([place]) 05/20/2017   Shoulder pain, left 06/28/2013    Assessment: 60 yo male presented on 03/24/2021 with weakness and confusion found to have upper GIB with symptomatic anemia. Patient is ESRD on HD. GI on board. Pharmacy consulted to start heparin for VTE prophylaxis. Hgb 7.9. Plt 106. SCD ordered and charted as on.    Goal of Therapy:  Monitor platelets by anticoagulation protocol: Yes   Plan:  Start heparin 5000 units SQ BID Watch CBC and signs of bleeding closely   Cristela Felt, PharmD, BCPS Clinical Pharmacist 03/25/2021 6:29 PM

## 2021-03-25 NOTE — Progress Notes (Signed)
[facility] Progress Note  Subjective: seen on HD  Vitals:   03/25/21 1200 03/25/21 1230 03/25/21 1300 03/25/21 1540  BP: 120/73 115/73 121/85 133/75  Pulse:      Resp: 18 15 11 12   Temp:      TempSrc:      SpO2:      Weight:      Height:        Exam: General: Chronically ill appearing disheveled male in NAD Head: Normocephalic, atraumatic, sclera non-icteric, mucus membranes are moist Neck: Supple. JVD not elevated. Lungs: Clear bilaterally to auscultation without wheezes, rales, or rhonchi. Breathing is unlabored. Heart: RRR with S1 S2. No murmurs, rubs, or gallops appreciated. Abdomen: Obese, protuberant, NT. NABS.  Lower extremities:3+ BLE pitting edema, pedal edema  Neuro: Alert and oriented X 3. Moves all extremities spontaneously. Psych:  Responds to questions appropriately with a normal affect. Dialysis Access: L AVF + T/B   OP HD: SW TTS  4h  450/500  129kg  2/2 bath L AVF  Hep none -Mircera 225 mcg IV q 2 weeks (last dose 03/02/2021) -Venofer 100 mg IV X 10 doses (1/10 doses given) -Hectorol 3 mcg IV TIW   Assessment/Plan:  Symptomatic anemia: HGB 6.8 on adm. Last OP HGB 7.5 03/14/2021, Probably recurrent UGIB but has not had ESA in OP center since 03/02/2021 D/T missed and truncated treatments. GI consulted. PRBCs ordered per primary.   Visual Hallucinations: H/O polysubstance abuse. BUN 43 SCr 11.2. Only 4.7 hr of 12 hours weekly HD rec'd this week. Lack of dialysis probably contributing. Gabapentin dose at OP clinic was 100 mg PO BID. Max Gabapentin dose for ESRD pt is 300 mg PO daily. Has been prescribed carafate by GI in past which was dc'd. Unable to check aluminum level at hospital. Should not be on carafate for longer than 2 weeks D/T risk of aluminum toxicity.   ESRD - usual HD is TTS.  Will order HD tomorrow off schedule as he is severely under dialyzed. No heparin. Then resume T,Th,S schedule w/ HD tomorrow.   Hypertension/volume  - BP  controlled. CXR unremarkable but 3+ pitting edema by exam. Left 7.5 kg above OP EDW. Lower volume as tolerated. HTN managed by Dr. Clover Mealy. Clonidine 0.1 mg PO BID hold in AM on HD days, Amlodipine 10 mg PO q HS. Both are on hold D/T soft BP.   Anemia  - As noted above. Give ESA with HD tomorrow. Was getting Fe load at [place] for Tsat 28 03/14/2021. Will continue.   Metabolic bone disease -  Continue VDRA, resume binders when eating.  Nutrition - Clear liquids.  H/O calciphylaxis. Continue sodium thiosulfate. Avoid calcium based medications.  DMT2-per primary     Rob Nicklas Mcsweeney 03/25/2021, 4:35 PM   Recent Labs  Lab 03/25/21 0138 03/25/21 0903  K 4.0 4.4  BUN 45* 48*  CREATININE 12.03* 12.65*  CALCIUM 8.4* 8.6*  PHOS  --  4.4  HGB 8.1* 7.9*   Inpatient medications:  Chlorhexidine Gluconate Cloth  6 each Topical Q0600   doxercalciferol  3 mcg Intravenous Q T,Th,Sa-HD   folic acid  1 mg Oral Daily   insulin aspart  0-6 Units Subcutaneous TID WC   multivitamin with minerals  1 tablet Oral Daily   nicotine  21 mg Transdermal Daily   pantoprazole  40 mg Oral BID   sodium chloride flush  3 mL Intravenous Q12H   thiamine  100 mg Oral Daily  sodium chloride     ferric gluconate (FERRLECIT) IVPB     sodium thiosulfate infusion for calciphylaxis     sodium chloride, acetaminophen **OR** acetaminophen, cyclobenzaprine, diphenhydrAMINE-zinc acetate, hydrALAZINE, LORazepam **OR** LORazepam, sodium chloride flush

## 2021-03-25 NOTE — Progress Notes (Signed)
PROGRESS NOTE    PASHA BROAD  AQL:[id number] DOB: 1960/09/20 DOA: 03/24/2021 PCP: [name], NP   Brief Narrative:   Daniel [name] is a 60 y.o. BM PMHx ESRD on HD T/Th/S, remote EtOH and Cocaine use, known history of peptic ulcer-most recent EGD on 10/14 with no bleeding and then small bowel enteroscopy on 02/24/21 where he was found to have 3 AVM, one actively bleeding. Hemostasis obtained with epinephrine and the other 2 non bleeding AVMs were treated with APC coagulation without epinephrine, T2DM, anemia, hepatitis C   Presented to ED with one weak history of progressive weakness, confusion and new "involuntary jerking" of his upper extremities. He also states that his legs are swollen, but near baseline. He denies any blood in his stool. He states due to his weakness it's hard to even walk.    He does report having visual hallucinations that started 4-7 days ago where he will see people in his house. They do not scare him and he knows that they are not there. He states he saw someone in his bedroom and another time there were lots of people sitting with funny hats on.    Denies any fevers/chills, headaches, vision changes, chest pain, palpitations, shortness of breath, cough, stomach pain, N/V/D, dysuria. No change in chronic skin rash.      ED Course: vitals: temperature: 99.7, HR: 88, BP: 104/53, RR: 20, oxygen: 97% RA Pertinent labs: hgb: 6.8, (8.1 13 days ago), platelets 103, BUN: 40, creatinine: 11.59, AST: 51, AG: 17, fecal occult: positive CXR: chronic vascular congestion. Cardiomegaly. No acute findings.  In Ed: 1 unit of PRBC ordered to transfuse and TRH was asked to admit.    Review of Systems: As per HPI; otherwise review of systems reviewed and negative.    Ambulatory Status:  Ambulates without assistance walker?    Subjective: A/O x4, states bilateral lower extremity pain which started 4 days ago and has continued to worsen.  No injury reported to  either extremity.   Assessment & Plan:  Covid vaccination; ,  Active Problems:   Essential hypertension   DM2 (diabetes mellitus, type 2) ([place])   Chronic hepatitis C without hepatic coma (HCC)   Acute on chronic anemia   ESRD on hemodialysis (HCC)   Nicotine dependence, cigarettes, uncomplicated   Transaminitis   Generalized weakness   Prolonged QT interval   Upper GI bleed   Chalazion of right upper eyelid   Thrombocytopenia (HCC)   Visual hallucination   Bilateral leg edema   AVM (arteriovenous malformation) of duodenum, acquired   Peptic ulcer disease   End-stage renal disease on hemodialysis ([place])   Diabetes mellitus type 2, controlled, with complications ([place])   QT prolongation   Alcohol abuse with intoxication ([place])   Cocaine abuse ([place])   Nicotine abuse   Morbidly obese ([place])  Upper GI bleed with symptomatic anemia in setting of known PUD and AVM of small bowel with hemorrhage Lab Results  Component Value Date   HGB 7.9 (L) 03/25/2021   HGB 8.1 (L) 03/25/2021   HGB 8.1 (L) 03/24/2021   HGB 6.8 (LL) 03/24/2021   HGB 7.0 (L) 03/19/2021  - known PUD/AVM with hemorrhage presenting with worsening weakness x 1 week and +fecal occult with hemoglobin to 6.8 --11/27 transfuse 1 unit PRBC Transfuse to keep hgb >7.0  -serial cbc q 6 -protonix IV BID -clear liquid diet and SCDs -GI consulted  -02/08/2021 EGD; showed 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  Mild inflammation with friability and granularity in the stomach.  As well as a 2 mm nonbleeding gastric ulcer in the gastric antrum. Plan to repeat EGD in 8 weeks -02/24/2021 small bowel enteroscopy: 3 AVM in duodenum with one actively bleeding.   Acute on chronic anemia (baseline HgB 7.8--8.2) Lab Results  Component Value Date   HGB 7.9 (L) 03/25/2021   HGB 8.1 (L) 03/25/2021   HGB 8.1 (L) 03/24/2021   HGB 6.8 (LL) 03/24/2021   HGB 7.0 (L) 03/19/2021  -Likely secondary to known peptic ulcer  disease/AVM -11/27 transfuse 1 unit PRBC  -Transfused to maintain HgB>7    Thrombocytopenia-chronic  -question if due to chronic hep c, but typically not this low, baseline 120-130 -continue to monitor  -check INR    Visual hallucinations Psychiatry consult, not actively hallucinating. No harm to self or others. Does not need a sitter at this time.  -he is alert and oriented on my exam with no signs of confusion or neurological deficit, but complains of "talking out of his head at times."  -check ammonia/TSH. Recent B12 wnl.  -no signs of infection, monitor CBC/fever curve.  -check head CT -11/28 blood UDS pending - 11/28 per psychiatry recommendation DC gabapentin; they do not believe patient candidate for antipsychotics at this time.   Involuntary jerking movements -not really reproducible, finger to nose intact -? If more FMD vs. Gabapentin. Holding.  -check TSH, recent B12 wnl -trial flexeril prn -11/28 resolved   Right upper lid chalazion -x3+ week history -will try warm compresses, but will need opthalmology if still present in 1-2 weeks   Worsening lower extremity in setting of significant PVD and dialysis noncompliance  -bilateral LE doppler negative for DVT on 11/22 -CTA aortobifemoral done on 11/22: no acute occulusion, but stenosis in right external iliac artery  and tandem stenoses in bilateral superficial femoral arteries of possible hemodynamic significant.  -has seen vascular outpatient 08/2020 recommended compressions and he had triphasic waveforms at level of ankle bilaterally.  -recommend f;u with vascular outpatient for possible consideration of angiography if he has worsening edema despite adequate volume control with dialysis   Bilateral lower extremity pain - Bilateral lower extremity pain, swelling LEFT>>> RIGHT -11/28 Doppler US pending     ESRD on hemodialysis (HD T/Th/S -He has dialysis on Tuesday/Thursdays and Saturdays. After discussion with  nephrology has only had 4 hours of dialysis over past week -Nephrology has been consulted -no signs of volume overload (LE edema seems to be at baseline)  -intake/output.    Transaminitis-chronic  Chronic. Likely secondary to drinking and chronic HCV. Drinks a beer "every now and then" -acute hep panel checked in 12/2020. Reactive HCV ab -[place]: 10/22-cholelithiasis with associated nonspecific gallbladder wall thickening. Negative murphy sign on exam.      DM2 (diabetes mellitus, type 2) (HCC) A1c: 5.4 in 09/2020 Diet controlled Accuchecks and sensitive SSI per protocol      Essential hypertension -some soft bp reads. MAR not done, will write for PRN hydralazine  QT interval prolongation - QTC borderline prolongation. - Judicious use of any QT prolonging medication especially inpatient with renal failure     History of alcohol abuse and cocaine abuse -remote hx  -UDS pending in setting of confusion/hallucinations  -MV, thiamine, folic acid  -no hx of withdrawals, but CIWA protocol placed.      Chronic hepatitis C without hepatic coma (HCC) -stable     Nicotine dependence, cigarettes, uncomplicated Nicotine patch and  encouraged to quit.   Skin rash Characteristic of perforating folliculitis    Generalized weakness  -PT eval for weakness   Morbidly obese (BMI 41.57 kg/m)  Goals of care - 11/28 consult LCSW: Homelessness.     DVT prophylaxis: Heparin subcu Code Status: Full Family Communication:  Status is: Inpatient    Dispo: The patient is from:  Homeless              Anticipated d/c is to:  Homeless              Anticipated d/c date is: 3 days              Patient currently is not medically stable to d/c.      Consultants:  Nephrology  Procedures/Significant Events:     I have personally reviewed and interpreted all radiology studies and my findings are as above.  VENTILATOR SETTINGS:    Cultures   Antimicrobials:    Devices    LINES /  TUBES:      Continuous Infusions:  sodium chloride     ferric gluconate (FERRLECIT) IVPB     sodium thiosulfate infusion for calciphylaxis       Objective: Vitals:   03/25/21 1540 03/25/21 1600 03/25/21 1651 03/25/21 1700  BP: 133/75  122/78   Pulse:   82 81  Resp: 12 20 18 15   Temp:   97.9 F (36.6 C)   TempSrc:   Oral   SpO2:   99% 99%  Weight:      Height:        Intake/Output Summary (Last 24 hours) at 03/25/2021 1807 Last data filed at 03/25/2021 1600 Gross per 24 hour  Intake 300 ml  Output --  Net 300 ml   Filed Weights   03/24/21 1034 03/24/21 2235 03/25/21 0837  Weight: 117.9 kg 135.2 kg (!) 138.7 kg    Examination:  General: No acute respiratory distress Eyes: negative scleral hemorrhage, negative anisocoria, negative icterus ENT: Negative Runny nose, negative gingival bleeding, Neck:  Negative scars, masses, torticollis, lymphadenopathy, JVD Lungs: Clear to auscultation bilaterally without wheezes or crackles Cardiovascular: Regular rate and rhythm without murmur gallop or rub normal S1 and S2 Abdomen: negative abdominal pain, nondistended, positive soft, bowel sounds, no rebound, no ascites, no appreciable mass Extremities: bilateral lower extremity edema LEFT>> RIGHT.  Pain to palpation calfs LEFT>> RIGHT Skin: Negative rashes, lesions, ulcers Psychiatric:  Negative depression, negative anxiety, negative fatigue, negative mania  Central nervous system:  Cranial nerves II through XII intact, tongue/uvula midline, all extremities muscle strength 5/5, sensation intact throughout, negative dysarthria, negative expressive aphasia, negative receptive aphasia.  .     Data Reviewed: Care during the described time interval was provided by me .  I have reviewed this patient's available data, including medical history, events of note, physical examination, and all test results as part of my evaluation.   CBC: Recent Labs  Lab 03/19/21 0702 03/24/21 0917  03/24/21 1831 03/25/21 0138 03/25/21 0903  WBC 4.8 5.2 5.4 5.7 4.9  NEUTROABS 2.0 2.3  --   --  2.4  HGB 7.0* 6.8* 8.1* 8.1* 7.9*  HCT 22.5* 21.4* 26.2* 25.5* 24.7*  MCV 108.7* 105.4* 103.6* 101.6* 101.2*  PLT 128* 103* 107* 108* 433*   Basic Metabolic Panel: Recent Labs  Lab 03/19/21 0702 03/24/21 0917 03/24/21 1124 03/25/21 0138 03/25/21 0903  NA 148* 142  --  138 142  K 3.7 4.3  --  4.0 4.4  CL  114* 102  --  99 102  CO2 22 23  --  24 23  GLUCOSE 81 111*  --  105* 100*  BUN 45* 40*  --  45* 48*  CREATININE 12.52* 11.59*  --  12.03* 12.65*  CALCIUM 8.4* 8.6*  --  8.4* 8.6*  MG  --   --  2.2  --  2.4  PHOS  --   --   --   --  4.4   GFR: Estimated Creatinine Clearance: 8.7 mL/min (A) (by C-G formula based on SCr of 12.65 mg/dL (H)). Liver Function Tests: Recent Labs  Lab 03/24/21 0917 03/25/21 0138 03/25/21 0903  AST 51* 57* 57*  ALT 19 20 20   ALKPHOS 64 62 64  BILITOT 0.5 0.7 0.6  PROT 6.8 7.2 7.0  ALBUMIN 2.1* 2.2* 2.1*   No results for input(s): LIPASE, AMYLASE in the last 168 hours. Recent Labs  Lab 03/24/21 1831  AMMONIA 23   Coagulation Profile: Recent Labs  Lab 03/24/21 1831  INR 1.0   Cardiac Enzymes: No results for input(s): CKTOTAL, CKMB, CKMBINDEX, TROPONINI in the last 168 hours. BNP (last 3 results) No results for input(s): PROBNP in the last 8760 hours. HbA1C: No results for input(s): HGBA1C in the last 72 hours. CBG: Recent Labs  Lab 03/24/21 1649 03/24/21 2139 03/25/21 1700  GLUCAP 88 71 136*   Lipid Profile: No results for input(s): CHOL, HDL, LDLCALC, TRIG, CHOLHDL, LDLDIRECT in the last 72 hours. Thyroid Function Tests: Recent Labs    03/24/21 1831  TSH 3.465   Anemia Panel: No results for input(s): VITAMINB12, FOLATE, FERRITIN, TIBC, IRON, RETICCTPCT in the last 72 hours. Urine analysis:    Component Value Date/Time   COLORURINE YELLOW 07/31/2019 2128   APPEARANCEUR CLEAR 07/31/2019 2128   LABSPEC 1.009  07/31/2019 2128   PHURINE 5.0 07/31/2019 2128   GLUCOSEU NEGATIVE 07/31/2019 2128   HGBUR SMALL (A) 07/31/2019 2128   BILIRUBINUR NEGATIVE 07/31/2019 2128   KETONESUR NEGATIVE 07/31/2019 2128   PROTEINUR 100 (A) 07/31/2019 2128   UROBILINOGEN 0.2 12/31/2009 0746   NITRITE NEGATIVE 07/31/2019 2128   LEUKOCYTESUR NEGATIVE 07/31/2019 2128   Sepsis Labs: @LABRCNTIP (procalcitonin:4,lacticidven:4)  ) Recent Results (from the past 240 hour(s))  Resp Panel by RT-PCR (Flu A&B, Covid) Nasopharyngeal Swab     Status: None   Collection Time: 03/24/21  1:45 PM   Specimen: Nasopharyngeal Swab; Nasopharyngeal(NP) swabs in vial transport medium  Result Value Ref Range Status   SARS Coronavirus 2 by RT PCR NEGATIVE NEGATIVE Final    Comment: (NOTE) SARS-CoV-2 target nucleic acids are NOT DETECTED.  The SARS-CoV-2 RNA is generally detectable in upper respiratory specimens during the acute phase of infection. The lowest concentration of SARS-CoV-2 viral copies this assay can detect is 138 copies/mL. A negative result does not preclude SARS-Cov-2 infection and should not be used as the sole basis for treatment or other patient management decisions. A negative result may occur with  improper specimen collection/handling, submission of specimen other than nasopharyngeal swab, presence of viral mutation(s) within the areas targeted by this assay, and inadequate number of viral copies(<138 copies/mL). A negative result must be combined with clinical observations, patient history, and epidemiological information. The expected result is Negative.  Fact Sheet for Patients:  EntrepreneurPulse.com.au  Fact Sheet for Healthcare Providers:  IncredibleEmployment.be  This test is no t yet approved or cleared by the [place] FDA and  has been authorized for detection and/or diagnosis of SARS-CoV-2 by FDA under  an Emergency Use Authorization (EUA). This EUA will  remain  in effect (meaning this test can be used) for the duration of the COVID-19 declaration under Section 564(b)(1) of the Act, 21 U.S.C.section 360bbb-3(b)(1), unless the authorization is terminated  or revoked sooner.       Influenza A by PCR NEGATIVE NEGATIVE Final   Influenza B by PCR NEGATIVE NEGATIVE Final    Comment: (NOTE) The Xpert Xpress SARS-CoV-2/FLU/RSV plus assay is intended as an aid in the diagnosis of influenza from Nasopharyngeal swab specimens and should not be used as a sole basis for treatment. Nasal washings and aspirates are unacceptable for Xpert Xpress SARS-CoV-2/FLU/RSV testing.  Fact Sheet for Patients: EntrepreneurPulse.com.au  Fact Sheet for Healthcare Providers: IncredibleEmployment.be  This test is not yet approved or cleared by the [place] FDA and has been authorized for detection and/or diagnosis of SARS-CoV-2 by FDA under an Emergency Use Authorization (EUA). This EUA will remain in effect (meaning this test can be used) for the duration of the COVID-19 declaration under Section 564(b)(1) of the Act, 21 U.S.C. section 360bbb-3(b)(1), unless the authorization is terminated or revoked.  Performed at [facility], [place] [address], [place], [place] [address]          Radiology Studies: DG Chest 2 View  Result Date: 03/24/2021 CLINICAL DATA:  Weakness.  Seizures. EXAM: CHEST - 2 VIEW COMPARISON:  Radiographs 03/17/2021 and CT 06/29/2019. FINDINGS: Stable cardiomegaly and aortic atherosclerosis. There is vascular congestion without overt pulmonary edema, confluent airspace opacity, pneumothorax or significant pleural effusion. Mild degenerative changes are present within the spine. Telemetry leads overlie the chest. Vascular stent noted overlying the left shoulder. IMPRESSION: Cardiomegaly with chronic vascular congestion. No acute findings identified. Electronically Signed   By: Richardean Sale  M.D.   On: 03/24/2021 12:30   CT HEAD WO CONTRAST (5MM)  Result Date: 03/24/2021 CLINICAL DATA:  Seizures with increased twitching and hallucinations. Recent medication change. EXAM: CT HEAD WITHOUT CONTRAST TECHNIQUE: Contiguous axial images were obtained from the base of the skull through the vertex without intravenous contrast. COMPARISON:  CT head 01/10/2021 FINDINGS: Brain: There is no evidence of acute intracranial hemorrhage, mass lesion, brain edema or extra-axial fluid collection. Stable mild atrophy with prominence of the ventricles and subarachnoid spaces. Stable mild chronic small vessel ischemic changes in the periventricular white matter. There is no CT evidence of acute cortical infarction. Vascular: Prominent intracranial vascular calcifications. No hyperdense vessel identified. Skull: Negative for fracture or focal lesion. Sinuses/Orbits: The visualized paranasal sinuses and mastoid air cells are clear. No orbital abnormalities are seen. Other: None. IMPRESSION: Stable head CT.  No acute intracranial findings. Electronically Signed   By: Richardean Sale M.D.   On: 03/24/2021 15:41   VAS [place] LOWER EXTREMITY VENOUS (DVT)  Result Date: 03/25/2021  Lower Venous DVT Study Patient Name:  SINGLETON HICKOX  Date of Exam:   03/25/2021 Medical Rec #: [id number]           Accession #:    [id number] Date of Birth: 1961/04/06           Patient Gender: M Patient Age:   25 years Exam Location:  [facility] Procedure:      VAS [place] LOWER EXTREMITY VENOUS (DVT) Referring Phys: Vicente Serene Yaden Seith --------------------------------------------------------------------------------  Indications: Pain.  Comparison Study: prior 03/19/21 Performing Technologist: Archie Patten RVS  Examination Guidelines: A complete evaluation includes B-mode imaging, spectral Doppler, color Doppler, and power Doppler as needed of all accessible  portions of each vessel. Bilateral testing is considered an integral part of a complete  examination. Limited examinations for reoccurring indications may be performed as noted. The reflux portion of the exam is performed with the patient in reverse Trendelenburg.  +---------+---------------+---------+-----------+----------+--------------+ RIGHT    CompressibilityPhasicitySpontaneityPropertiesThrombus Aging +---------+---------------+---------+-----------+----------+--------------+ CFV      Full           Yes      Yes                                 +---------+---------------+---------+-----------+----------+--------------+ SFJ      Full                                                        +---------+---------------+---------+-----------+----------+--------------+ FV Prox  Full                                                        +---------+---------------+---------+-----------+----------+--------------+ FV Mid   Full                                                        +---------+---------------+---------+-----------+----------+--------------+ FV DistalFull                                                        +---------+---------------+---------+-----------+----------+--------------+ PFV      Full                                                        +---------+---------------+---------+-----------+----------+--------------+ POP      Full           Yes      Yes                                 +---------+---------------+---------+-----------+----------+--------------+ PTV      Full                                                        +---------+---------------+---------+-----------+----------+--------------+ PERO     Full                                                        +---------+---------------+---------+-----------+----------+--------------+   +---------+---------------+---------+-----------+----------+--------------+ LEFT  CompressibilityPhasicitySpontaneityPropertiesThrombus Aging  +---------+---------------+---------+-----------+----------+--------------+ CFV      Full           Yes      Yes                                 +---------+---------------+---------+-----------+----------+--------------+ SFJ      Full                                                        +---------+---------------+---------+-----------+----------+--------------+ FV Prox  Full                                                        +---------+---------------+---------+-----------+----------+--------------+ FV Mid   Full                                                        +---------+---------------+---------+-----------+----------+--------------+ FV DistalFull                                                        +---------+---------------+---------+-----------+----------+--------------+ PFV      Full                                                        +---------+---------------+---------+-----------+----------+--------------+ POP      Full           Yes      Yes                                 +---------+---------------+---------+-----------+----------+--------------+ PTV      Full                                                        +---------+---------------+---------+-----------+----------+--------------+ PERO     Full                                                        +---------+---------------+---------+-----------+----------+--------------+     Summary: BILATERAL: - No evidence of deep vein thrombosis seen in the lower extremities, bilaterally. -No evidence of popliteal cyst, bilaterally.   *See table(s) above for measurements and observations. Electronically signed by Monica Martinez MD on 03/25/2021 at 4:44:01 PM.    Final  Scheduled Meds:  Chlorhexidine Gluconate Cloth  6 each Topical Q0600   doxercalciferol  3 mcg Intravenous Q T,Th,Sa-HD   folic acid  1 mg Oral Daily   insulin aspart  0-6 Units Subcutaneous TID  WC   multivitamin with minerals  1 tablet Oral Daily   nicotine  21 mg Transdermal Daily   pantoprazole  40 mg Oral BID   sodium chloride flush  3 mL Intravenous Q12H   thiamine  100 mg Oral Daily   Continuous Infusions:  sodium chloride     ferric gluconate (FERRLECIT) IVPB     sodium thiosulfate infusion for calciphylaxis       LOS: 1 day   The patient is critically ill with multiple organ systems failure and requires high complexity decision making for assessment and support, frequent evaluation and titration of therapies, application of advanced monitoring technologies and extensive interpretation of multiple databases. Critical Care Time devoted to patient care services described in this note  Time spent: 40 minutes     Aletta Edmunds, Geraldo Docker, MD [facility]   If 7PM-7AM, please contact night-coverage 03/25/2021, 6:07 PM

## 2021-03-25 NOTE — Progress Notes (Signed)
Lower extremity venous has been completed.   Preliminary results in CV Proc.   Daniel [name] 03/25/2021 3:25 PM

## 2021-03-25 NOTE — Progress Notes (Signed)
PT Cancellation Note  Patient Details Name: Daniel [name] MRN: [id number] DOB: 1961/01/31   Cancelled Treatment:    Reason Eval/Treat Not Completed: Patient declined, no reason specified  Pt at HD in morning and declined PT in afternoon due to fatigue.  While in room , RN also reports scheduled LE U/S to evaluate for DVT, however, once back in computer this had been completed and negative. Will f/u tomorrow as able. Abran Jhonathan, PT Acute Rehab Services Pager [phone] [facility] [place] 03/25/2021, 4:25 PM

## 2021-03-26 ENCOUNTER — Encounter (HOSPITAL_COMMUNITY)
Admission: EM | Disposition: A | Discharge status: Home / Self Care | Payer: Self-pay | Source: Home / Self Care | Attending: Internal Medicine

## 2021-03-26 ENCOUNTER — Encounter (HOSPITAL_COMMUNITY): Payer: Self-pay | Admitting: Family Medicine

## 2021-03-26 ENCOUNTER — Inpatient Hospital Stay (HOSPITAL_COMMUNITY): Payer: Medicare Other | Admitting: Certified Registered"

## 2021-03-26 DIAGNOSIS — K2980 Duodenitis without bleeding: Secondary | ICD-10-CM

## 2021-03-26 DIAGNOSIS — K2970 Gastritis, unspecified, without bleeding: Secondary | ICD-10-CM

## 2021-03-26 HISTORY — PX: BIOPSY: SHX5522

## 2021-03-26 HISTORY — PX: ENTEROSCOPY: SHX5533

## 2021-03-26 HISTORY — PX: HOT HEMOSTASIS: SHX5433

## 2021-03-26 LAB — COMPREHENSIVE METABOLIC PANEL
ALT: 21 U/L (ref 0–44)
AST: 62 U/L — ABNORMAL HIGH (ref 15–41)
Albumin: 2.2 g/dL — ABNORMAL LOW (ref 3.5–5.0)
Alkaline Phosphatase: 67 U/L (ref 38–126)
Anion gap: 11 (ref 5–15)
BUN: 24 mg/dL — ABNORMAL HIGH (ref 8–23)
CO2: 28 mmol/L (ref 22–32)
Calcium: 8.4 mg/dL — ABNORMAL LOW (ref 8.9–10.3)
Chloride: 98 mmol/L (ref 98–111)
Creatinine, Ser: 7.89 mg/dL — ABNORMAL HIGH (ref 0.61–1.24)
GFR, Estimated: 7 mL/min — ABNORMAL LOW (ref >60)
Glucose, Bld: 87 mg/dL (ref 70–99)
Potassium: 4 mmol/L (ref 3.5–5.1)
Sodium: 137 mmol/L (ref 135–145)
Total Bilirubin: 0.6 mg/dL (ref 0.3–1.2)
Total Protein: 7.4 g/dL (ref 6.5–8.1)

## 2021-03-26 LAB — GLUCOSE, CAPILLARY
Glucose-Capillary: 79 mg/dL (ref 70–99)
Glucose-Capillary: 75 mg/dL (ref 70–99)
Glucose-Capillary: 83 mg/dL (ref 70–99)
Glucose-Capillary: 81 mg/dL (ref 70–99)
Glucose-Capillary: 88 mg/dL (ref 70–99)
Glucose-Capillary: 112 mg/dL — ABNORMAL HIGH (ref 70–99)

## 2021-03-26 LAB — HEPATITIS B SURFACE ANTIGEN: Hepatitis B Surface Ag: NONREACTIVE

## 2021-03-26 LAB — CBC WITH DIFFERENTIAL/PLATELET
Abs Immature Granulocytes: 0.03 10*3/uL (ref 0.00–0.07)
Basophils Absolute: 0 10*3/uL (ref 0.0–0.1)
Basophils Relative: 0 %
Eosinophils Absolute: 0.1 10*3/uL (ref 0.0–0.5)
Eosinophils Relative: 2 %
HCT: 25.8 % — ABNORMAL LOW (ref 39.0–52.0)
Hemoglobin: 8.2 g/dL — ABNORMAL LOW (ref 13.0–17.0)
Immature Granulocytes: 1 %
Lymphocytes Relative: 34 %
Lymphs Abs: 1.6 10*3/uL (ref 0.7–4.0)
MCH: 32.2 pg (ref 26.0–34.0)
MCHC: 31.8 g/dL (ref 30.0–36.0)
MCV: 101.2 fL — ABNORMAL HIGH (ref 80.0–100.0)
Monocytes Absolute: 0.5 10*3/uL (ref 0.1–1.0)
Monocytes Relative: 10 %
Neutro Abs: 2.5 10*3/uL (ref 1.7–7.7)
Neutrophils Relative %: 53 %
Platelets: 101 10*3/uL — ABNORMAL LOW (ref 150–400)
RBC: 2.55 MIL/uL — ABNORMAL LOW (ref 4.22–5.81)
RDW: 20.2 % — ABNORMAL HIGH (ref 11.5–15.5)
WBC: 4.8 10*3/uL (ref 4.0–10.5)
nRBC: 0.8 % — ABNORMAL HIGH (ref 0.0–0.2)

## 2021-03-26 LAB — HEPATITIS B SURFACE ANTIBODY,QUALITATIVE: Hep B S Ab: REACTIVE — AB

## 2021-03-26 LAB — MAGNESIUM: Magnesium: 2 mg/dL (ref 1.7–2.4)

## 2021-03-26 LAB — PHOSPHORUS: Phosphorus: 3.1 mg/dL (ref 2.5–4.6)

## 2021-03-26 SURGERY — ENTEROSCOPY
Anesthesia: Monitor Anesthesia Care

## 2021-03-26 MED ORDER — PROPOFOL 500 MG/50ML IV EMUL
INTRAVENOUS | Status: DC | PRN
  Administered 2021-03-26: 125 ug/kg/min via INTRAVENOUS

## 2021-03-26 MED ORDER — RENA-VITE PO TABS: 1.0000 | ORAL_TABLET | Freq: Every day | ORAL | Status: DC

## 2021-03-26 MED ORDER — HYDROCODONE-ACETAMINOPHEN 5-325 MG PO TABS
1.0000 | ORAL_TABLET | ORAL | Status: DC | PRN
  Administered 2021-03-26 (×2): 1 via ORAL
  Filled 2021-03-26 (×2): qty 1

## 2021-03-26 MED ORDER — QUETIAPINE FUMARATE 25 MG PO TABS
50.0000 mg | ORAL_TABLET | Freq: Every day | ORAL | Status: DC
  Administered 2021-03-26: 50 mg via ORAL
  Filled 2021-03-26: qty 2

## 2021-03-26 MED ORDER — SODIUM CHLORIDE 0.9 % IV SOLN: INTRAVENOUS | Status: DC | PRN

## 2021-03-26 MED ORDER — GABAPENTIN 300 MG PO CAPS: 300.0000 mg | ORAL_CAPSULE | Freq: Two times a day (BID) | ORAL | Status: DC

## 2021-03-26 MED ORDER — PROPOFOL 10 MG/ML IV BOLUS
INTRAVENOUS | Status: DC | PRN
  Administered 2021-03-26: 30 mg via INTRAVENOUS
  Administered 2021-03-26 (×2): 20 mg via INTRAVENOUS

## 2021-03-26 MED ORDER — HYDROCODONE-ACETAMINOPHEN 5-325 MG PO TABS
ORAL_TABLET | ORAL | Status: AC
  Filled 2021-03-26: qty 1

## 2021-03-26 MED ORDER — LIDOCAINE 2% (20 MG/ML) 5 ML SYRINGE
INTRAMUSCULAR | Status: DC | PRN
  Administered 2021-03-26: 100 mg via INTRAVENOUS

## 2021-03-26 NOTE — Progress Notes (Signed)
[facility] Progress Note  Subjective: seen on HD. Had SB enteroscopy yesterday, 2 AVM's rx'd w/ APC.   Vitals:   03/26/21 1401 03/26/21 1430 03/26/21 1500 03/26/21 1530  BP: (!) 105/49 (!) 115/57 (!) 90/53 (!) 123/51  Pulse:  82 94 91  Resp:  16 17 15   Temp:      TempSrc:      SpO2:  100% 100% 100%  Weight:      Height:        Exam: General: Chronically ill appearing disheveled male in NAD Head: Normocephalic, atraumatic, sclera non-icteric, mucus membranes are moist Neck: Supple. JVD not elevated. Lungs: Clear bilaterally to auscultation without wheezes, rales, or rhonchi. Breathing is unlabored. Heart: RRR with S1 S2. No murmurs, rubs, or gallops appreciated. Abdomen: Obese, protuberant, NT. NABS.  Lower extremities:3+ BLE pitting edema, pedal edema  Neuro: Alert and oriented X 3. Moves all extremities spontaneously. Psych:  Responds to questions appropriately with a normal affect. Dialysis Access: L AVF + T/B   OP HD: SW TTS  4h  450/500  129kg  2/2 bath L AVF  Hep none -Mircera 225 mcg IV q 2 weeks (last dose 03/02/2021) -Venofer 100 mg IV X 10 doses (1/10 doses given) -Hectorol 3 mcg IV TIW   Assessment/Plan:  Symptomatic anemia: HGB 6.8 on adm. Last OP HGB 7.5 03/14/2021, Probably recurrent UGIB. 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  Also has not had ESA in OP center since 03/02/2021 D/T missed and truncated treatments.   Visual Hallucinations: H/O polysubstance abuse. BUN 43 SCr 11.2. Only 4.7 hr of 12 hours weekly HD rec'd this week. Lack of dialysis probably contributing. Gabapentin dose at OP clinic was 100 mg PO BID. Max Gabapentin dose for ESRD pt is 300 mg PO daily. Has been prescribed carafate by GI in past which was dc'd. Unable to check aluminum level at hospital. Should not be on carafate for longer than 2 weeks D/T risk of aluminum toxicity. MS back to baseline.   ESRD - usual HD is TTS. HD again today to  get back on schedule. No heparin. Then resume T,Th,S schedule w/ HD tomorrow.   Hypertension/volume  - BP controlled. CXR unremarkable but 2+ pitting edema by exam. Left 7.5 kg above OP EDW. Down to dry wt after HD today. Still has LE edema, may lower edw further.  OP meds are Clonidine 0.1 mg PO BID hold in AM on HD days, Amlodipine 10 mg PO q HS. Both are on hold D/T soft BP's which have improved now.   Anemia  - As noted above. Give ESA with HD tomorrow. Was getting Fe load at [place] for Tsat 28 03/14/2021. Will continue.   Metabolic bone disease -  Continue VDRA, resume binders when eating.  Nutrition - Clear liquids.  H/O calciphylaxis. Continue sodium thiosulfate. Avoid calcium based medications.  DMT2-per primary     Rob Kaleah Hagemeister 03/26/2021, 4:04 PM   Recent Labs  Lab 03/25/21 0903 03/25/21 2205 03/26/21 0138  K 4.4 4.0 4.0  BUN 48* 23 24*  CREATININE 12.65* 7.53* 7.89*  CALCIUM 8.6* 8.3* 8.4*  PHOS 4.4 3.1 3.1  HGB 7.9*  --  8.2*    Inpatient medications:  Chlorhexidine Gluconate Cloth  6 each Topical Q0600   doxercalciferol  3 mcg Intravenous Q T,Th,Sa-HD   folic acid  1 mg Oral Daily   heparin injection (subcutaneous)  5,000 Units Subcutaneous Q12H  insulin aspart  0-6 Units Subcutaneous TID WC   [START ON 03/27/2021] multivitamin  1 tablet Oral QHS   nicotine  21 mg Transdermal Daily   pantoprazole  40 mg Oral BID   QUEtiapine  50 mg Oral QHS   sodium chloride flush  3 mL Intravenous Q12H   thiamine  100 mg Oral Daily    sodium chloride     ferric gluconate (FERRLECIT) IVPB     sodium thiosulfate infusion for calciphylaxis     sodium chloride, acetaminophen **OR** acetaminophen, cyclobenzaprine, diphenhydrAMINE-zinc acetate, hydrALAZINE, HYDROcodone-acetaminophen, LORazepam **OR** LORazepam, sodium chloride flush

## 2021-03-26 NOTE — Interval H&P Note (Signed)
History and Physical Interval Note:  03/26/2021 9:10 AM  Daniel [name]  has presented today for surgery, with the diagnosis of anemia.   blood loss.  The various methods of treatment have been discussed with the patient and family. After consideration of risks, benefits and other options for treatment, the patient has consented to  Procedure(s): ENTEROSCOPY (N/A) as a surgical intervention.  The patient's history has been reviewed, patient examined, no change in status, stable for surgery.  I have reviewed the patient's chart and labs.  Questions were answered to the patient's satisfaction.     [name]

## 2021-03-26 NOTE — Progress Notes (Signed)
Treatment terminated at patient's request.

## 2021-03-26 NOTE — Transfer of Care (Signed)
Immediate Anesthesia Transfer of Care Note  Patient: Daniel [name]  Procedure(s) Performed: ENTEROSCOPY HOT HEMOSTASIS (ARGON PLASMA COAGULATION/BICAP) BIOPSY  Patient Location: PACU  Anesthesia Type:MAC  Level of Consciousness: drowsy and patient cooperative  Airway & Oxygen Therapy: Patient Spontanous Breathing and Patient connected to nasal cannula oxygen  Post-op Assessment: Report given to RN and Post -op Vital signs reviewed and stable  Post vital signs: Reviewed and stable  Last Vitals:  Vitals Value Taken Time  BP 91/47 03/26/21 1009  Temp    Pulse 88 03/26/21 1010  Resp 15 03/26/21 1010  SpO2 100 % 03/26/21 1010  Vitals shown include unvalidated device data.  Last Pain:  Vitals:   03/26/21 0851  TempSrc: Temporal  PainSc: 10-Worst pain ever      Patients Stated Pain Goal: 3 (47/15/95 3967)  Complications: No notable events documented.

## 2021-03-26 NOTE — Progress Notes (Signed)
Bp rechecked.  

## 2021-03-26 NOTE — Progress Notes (Signed)
Patient with cramping. UF turned off.

## 2021-03-26 NOTE — Anesthesia Preprocedure Evaluation (Signed)
Anesthesia Evaluation  Patient identified by MRN, date of birth, ID band Patient awake    Reviewed: Allergy & Precautions, NPO status , Patient's Chart, lab work & pertinent test results  Airway Mallampati: II  TM Distance: >3 FB Neck ROM: Full    Dental  (+) Dental Advisory Given, Missing, Poor Dentition,    Pulmonary Current Smoker and Patient abstained from smoking.,    Pulmonary exam normal breath sounds clear to auscultation       Cardiovascular hypertension, Pt. on medications Normal cardiovascular exam Rhythm:Regular Rate:Normal     Neuro/Psych negative neurological ROS     GI/Hepatic PUD, GERD  Medicated,(+)     substance abuse  alcohol use and cocaine use, Hepatitis -, C  Endo/Other  diabetes, Type 2Obesity   Renal/GU ESRF and DialysisRenal disease     Musculoskeletal negative musculoskeletal ROS (+)   Abdominal (+) + obese,   Peds  Hematology  (+) Blood dyscrasia (Thrombocytopenia), anemia ,   Anesthesia Other Findings Day of surgery medications reviewed with the patient.  Reproductive/Obstetrics                             Anesthesia Physical  Anesthesia Plan  ASA: 3  Anesthesia Plan: MAC   Post-op Pain Management:    Induction: Intravenous  PONV Risk Score and Plan: 0 and Propofol infusion and Treatment may vary due to age or medical condition  Airway Management Planned: Nasal Cannula, Natural Airway and Simple Face Mask  Additional Equipment:   Intra-op Plan:   Post-operative Plan:   Informed Consent: I have reviewed the patients History and Physical, chart, labs and discussed the procedure including the risks, benefits and alternatives for the proposed anesthesia with the patient or authorized representative who has indicated his/her understanding and acceptance.     Dental advisory given  Plan Discussed with: CRNA  Anesthesia Plan Comments:          Anesthesia Quick Evaluation

## 2021-03-26 NOTE — Progress Notes (Signed)
PT Cancellation Note  Patient Details Name: Daniel [name] MRN: [id number] DOB: July 14, 1960   Cancelled Treatment:    Reason Eval/Treat Not Completed: Patient at procedure or test/unavailable (HD). Will follow-up for PT treatment as schedule permits.  Mabeline Caras, PT, DPT Acute Rehabilitation Services  Pager [phone] Office [place] 03/26/2021, 3:53 PM

## 2021-03-26 NOTE — Care Management Important Message (Signed)
Important Message  Patient Details  Name: Daniel [name] MRN: [id number] Date of Birth: February 05, 1961   Medicare Important Message Given:  Yes     Joetta Manners 03/26/2021, 12:07 PM

## 2021-03-26 NOTE — Progress Notes (Signed)
Hemodialysis treatment terminated early at patient's request. AMA form signed and placed in chart. Dr. Jonnie Finner notified. Patient unable to receive scheduled dose of sodium thiosulfate due to early treatment termination. Reported to primary nurse. Received call from primary nurse, stating patient unable to receive dose on floor due administration orders stating to give during last hour of hemodialysis. Medication charted as missed dose.

## 2021-03-26 NOTE — Progress Notes (Signed)
Pt receives out-pt HD at [facility] SW/AF on TTS. Pt arrives at 6:50 for 7:10 chair time. Will assist as needed.  Melven Sartorius Renal Navigator [phone]

## 2021-03-26 NOTE — Plan of Care (Signed)
Plan of care is reviewed.  Problem: Education: Pt understood plan of care for upper endoscopy. Consent was signed. Goal: Knowledge of General Education information will improve Description: Including pain rating scale, medication(s)/side effects and non-pharmacologic comfort measures Outcome: Progressing   Problem: Clinical Measurements: Hemodynamics stable, NSR on the monitor. BP within normal limits. Goal: Cardiovascular complication will be avoided Outcome: Progressing   Problem: Clinical Measurements: No respiratory distress, on room air, SPO2 92-98%. Goal: Respiratory complications will improve Outcome: Progressing   Problem: Clinical Measurements: Remains afebrile.  Goal: Will remain free from infection Outcome: Progressing   Problem: Activity: Able to tolerate walking with a walker and one staff standby assisted to the bathroom.  Goal: Risk for activity intolerance will decrease Outcome: Progressing   Problem: Nutrition: Good appetite. NPO after midnight for upper endoscopy. Follow CBG, no signs of hypoglycemia. Goal: Adequate nutrition will be maintained Outcome: Progressing   Problem: Elimination: Pt had one normal brownish BM tonight.  Goal: Will not experience complications related to bowel motility Outcome: Progressing   Kennyth Lose, RN

## 2021-03-26 NOTE — Progress Notes (Signed)
Pt came back to rm 13 from endoscopy. Reinitiated tele. VSS. Call bell within reach.   Lavenia Atlas, RN

## 2021-03-26 NOTE — Anesthesia Postprocedure Evaluation (Signed)
Anesthesia Post Note  Patient: Daniel [name]  Procedure(s) Performed: ENTEROSCOPY HOT HEMOSTASIS (ARGON PLASMA COAGULATION/BICAP) BIOPSY     Patient location during evaluation: PACU Anesthesia Type: MAC Level of consciousness: awake Pain management: pain level controlled Vital Signs Assessment: post-procedure vital signs reviewed and stable Respiratory status: spontaneous breathing Cardiovascular status: stable Postop Assessment: no apparent nausea or vomiting Anesthetic complications: no   No notable events documented.  Last Vitals:  Vitals:   03/26/21 1010 03/26/21 1024  BP: (!) 91/47 101/60  Pulse: 88 85  Resp: 15 14  Temp: 36.4 C   SpO2: 100% 99%    Last Pain:  Vitals:   03/26/21 1024  TempSrc:   PainSc: 0-No pain                 Huston Foley

## 2021-03-26 NOTE — Op Note (Signed)
[facility] Patient Name: Daniel [name] Procedure Date : 03/26/2021 MRN: [id number] Attending MD: Gerrit Heck , MD Date of Birth: [birth year] CSN: [id number] Age: 60 Admit Type: Inpatient Procedure:                Small bowel enteroscopy Indications:              Acute post hemorrhagic anemia, history of small                            bowel AVMs                           60 year old male with history of ESRD on HD                            admitted with acute on chronic anemia and FOBT                            positive stool. History of bleeding duodenal AVMs                            treated with APC in 01/2021, along with prior                            history of gastritis, gastric ulcer (treated with                            hemostatic clips 1 month prior). Transfused 1 unit                            PRBCs with H/H at 8.2/26 (at baseline). No further                            bleeding since admission. Plan for enteroscopy                            today for diagnostic and therapeutic intent. Providers:                Gerrit Heck, MD, Dulcy Fanny, Benetta Spar, Technician Referring MD:              Medicines:                Monitored Anesthesia Care Complications:            No immediate complications. Estimated Blood Loss:     Estimated blood loss was minimal. Procedure:                Pre-Anesthesia Assessment:                           - Prior to the procedure, a History and Physical  was performed, and patient medications and                            allergies were reviewed. The patient's tolerance of                            previous anesthesia was also reviewed. The risks                            and benefits of the procedure and the sedation                            options and risks were discussed with the patient.                            All questions were  answered, and informed consent                            was obtained. Prior Anticoagulants: The patient is                            currently prescribed heparin for DVT prophylaxis.                            ASA Grade Assessment: III - A patient with severe                            systemic disease. After reviewing the risks and                            benefits, the patient was deemed in satisfactory                            condition to undergo the procedure.                           After obtaining informed consent, the endoscope was                            passed under direct vision. Throughout the                            procedure, the patient's blood pressure, pulse, and                            oxygen saturations were monitored continuously. The                            PCF-190TL ([id number] Olympus colonoscope was                            introduced through the mouth and advanced to the  proximal jejunum. The small bowel enteroscopy was                            accomplished without difficulty. The patient                            tolerated the procedure well. Scope In: Scope Out: Findings:      The examined esophagus was normal.      Diffuse mild inflammation characterized by congestion (edema) was found       in the gastric fundus and in the gastric body. Biopsies were taken with       a cold forceps for histology. Estimated blood loss was minimal.      Mild inflammation with duodenitis and nodularity with no bleeding was       found in the duodenal bulb.      Two angioectasias with no bleeding were found in the fourth portion of       the duodenum. Coagulation for hemostasis using argon plasma was       successful. Estimated blood loss: none. The remainder of the small bowel       to the proximal jejunum was normal appearing. Impression:               - Normal esophagus. 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                           - Mild, non-ulcer gastritis. Biopsied.                           - Mild inflammation with nodularity in the duodenal                            bulb. This overall appeared improved from images on                            the prior studies.                           - Two non-bleeding angioectasias in the duodenum.                            Treated with argon plasma coagulation (APC). Recommendation:           - Return patient to hospital ward for ongoing care.                           - Continue present medications.                           - Await pathology results.                           - Resume high dose PPI for 6 weeks, then reduce to  daily.                           - If recurrent bleeding, recommend next step to be                            video capsule endoscopy to evaluate for additional                            distal small bowel AVMs, with decision of                            device-assisted enteroscopy vs long-acting                            Octreotide depending on results.                           - Continue serial Hgb/Hct checks while inpatient.                           - Repeat CBC in 7-10 days as outpatient (can be                            done with hemodialysis).                           - Please do not hesitate to contact the inpatient                            GI service with additional questions or concerns. Procedure Code(s):        --- Professional ---                           [phone], 30, Small intestinal endoscopy, enteroscopy                            beyond second portion of duodenum, not including                            ileum; with control of bleeding (eg, injection,                            bipolar cautery, unipolar cautery, laser, heater                            probe, stapler, plasma coagulator)                           44361, Small  intestinal endoscopy, enteroscopy                            beyond second portion of duodenum, not including  ileum; with biopsy, single or multiple Diagnosis Code(s):        --- Professional ---                           K29.70, Gastritis, unspecified, without bleeding                           K31.89, Other diseases of stomach and duodenum                           K31.819, Angiodysplasia of stomach and duodenum                            without bleeding                           D62, Acute posthemorrhagic anemia CPT copyright 2019 [employer]. All rights reserved. The codes documented in this report are preliminary and upon coder review may  be revised to meet current compliance requirements. Gerrit Heck, MD 03/26/2021 10:16:41 AM Number of Addenda: 0

## 2021-03-26 NOTE — Progress Notes (Signed)
PROGRESS NOTE    Daniel [name]  TMH:[id number] DOB: Sep 12, 1960 DOA: 03/24/2021 PCP: [name], NP   Brief Narrative:   Daniel [name] is a 60 y.o. BM PMHx ESRD on HD T/Th/S, remote EtOH and Cocaine use, known history of peptic ulcer-most recent EGD on 10/14 with no bleeding and then small bowel enteroscopy on 02/24/21 where he was found to have 3 AVM, one actively bleeding. Hemostasis obtained with epinephrine and the other 2 non bleeding AVMs were treated with APC coagulation without epinephrine, T2DM, anemia, hepatitis C   Presented to ED with one weak history of progressive weakness, confusion and new "involuntary jerking" of his upper extremities. He also states that his legs are swollen, but near baseline. He denies any blood in his stool. He states due to his weakness it's hard to even walk.    He does report having visual hallucinations that started 4-7 days ago where he will see people in his house. They do not scare him and he knows that they are not there. He states he saw someone in his bedroom and another time there were lots of people sitting with funny hats on.    Denies any fevers/chills, headaches, vision changes, chest pain, palpitations, shortness of breath, cough, stomach pain, N/V/D, dysuria. No change in chronic skin rash.      ED Course: vitals: temperature: 99.7, HR: 88, BP: 104/53, RR: 20, oxygen: 97% RA Pertinent labs: hgb: 6.8, (8.1 13 days ago), platelets 103, BUN: 40, creatinine: 11.59, AST: 51, AG: 17, fecal occult: positive CXR: chronic vascular congestion. Cardiomegaly. No acute findings.  In Ed: 1 unit of PRBC ordered to transfuse and TRH was asked to admit.    Review of Systems: As per HPI; otherwise review of systems reviewed and negative.    Ambulatory Status:  Ambulates without assistance walker?    Subjective: 11/29 afebrile overnight A/O x4, positive chronic bilateral lower extremity pain.    Assessment & Plan:  Covid  vaccination; ,  Active Problems:   Essential hypertension   DM2 (diabetes mellitus, type 2) ([place])   Chronic hepatitis C without hepatic coma (HCC)   Acute on chronic anemia   ESRD on hemodialysis (HCC)   Nicotine dependence, cigarettes, uncomplicated   Transaminitis   Generalized weakness   Prolonged QT interval   Upper GI bleed   Chalazion of right upper eyelid   Thrombocytopenia (HCC)   Visual hallucination   Bilateral leg edema   AVM (arteriovenous malformation) of duodenum, acquired   Peptic ulcer disease   End-stage renal disease on hemodialysis ([place])   Diabetes mellitus type 2, controlled, with complications ([place])   QT prolongation   Alcohol abuse with intoxication ([place])   Cocaine abuse ([place])   Nicotine abuse   Morbidly obese ([place])   Gastritis and gastroduodenitis  Upper GI bleed with symptomatic anemia in setting of known PUD and AVM of small bowel with hemorrhage Lab Results  Component Value Date   HGB 8.2 (L) 03/26/2021   HGB 7.9 (L) 03/25/2021   HGB 8.1 (L) 03/25/2021   HGB 8.1 (L) 03/24/2021   HGB 6.8 (LL) 03/24/2021  - known PUD/AVM with hemorrhage presenting with worsening weakness x 1 week and +fecal occult with hemoglobin to 6.8 --11/27 transfuse 1 unit PRBC Transfuse to keep hgb >7.0  -serial cbc q 6 -protonix IV BID -clear liquid diet and SCDs -GI consulted  -02/08/2021 EGD; showed 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  Mild inflammation  with friability and granularity in the stomach.  As well as a 2 mm nonbleeding gastric ulcer in the gastric antrum. Plan to repeat EGD in 8 weeks -02/24/2021 small bowel enteroscopy: 3 AVM in duodenum with one actively bleeding.  -11/29 no active bleeds found small bowel enteroscopy see results below -Resume diet and if negative bleeding overnight discharge on 11/30. - Await pathology results. - Resume high dose PPI for 6 weeks, then reduce to daily. - If recurrent bleeding, recommend next step to  be video capsule endoscopy to evaluate for additional distal small bowel AVMs, with decision of device-assisted enteroscopy vs long-acting Octreotide depending on results. - Continue serial Hgb/Hct checks while inpatient. - Repeat CBC in 7-10 days as outpatient (can be done with hemodialysis).   Acute on chronic anemia (baseline HgB 7.8--8.2) Lab Results  Component Value Date   HGB 8.2 (L) 03/26/2021   HGB 7.9 (L) 03/25/2021   HGB 8.1 (L) 03/25/2021   HGB 8.1 (L) 03/24/2021   HGB 6.8 (LL) 03/24/2021  -Likely secondary to known peptic ulcer disease/AVM -11/27 transfuse 1 unit PRBC  -Transfused to maintain HgB>7 -11/29 stable.    Thrombocytopenia-chronic  -question if due to chronic hep c, but typically not this low, baseline 120-130 -continue to monitor  -check INR    Visual hallucinations Psychiatry consult, not actively hallucinating. No harm to self or others. Does not need a sitter at this time.  -he is alert and oriented on my exam with no signs of confusion or neurological deficit, but complains of "talking out of his head at times."  -check ammonia/TSH. Recent B12 wnl.  -no signs of infection, monitor CBC/fever curve.  -check head CT -11/28 blood UDS pending - 11/28 per psychiatry recommendation DC gabapentin; they do not believe patient candidate for antipsychotics at this time. -11/29 per psychiatry recommendation Seroquel 50 mg qhs.  Psychiatry requests that patient have 30-day supply upon discharge.   Involuntary jerking movements -not really reproducible, finger to nose intact -? If more FMD vs. Gabapentin. Holding.  -check TSH, recent B12 wnl -trial flexeril prn -11/28 resolved   Right upper lid chalazion -x3+ week history -will try warm compresses, but will need opthalmology if still present in 1-2 weeks   Worsening lower extremity in setting of significant PVD and dialysis noncompliance  -bilateral LE doppler negative for DVT on 11/22 -CTA aortobifemoral  done on 11/22: no acute occulusion, but stenosis in right external iliac artery  and tandem stenoses in bilateral superficial femoral arteries of possible hemodynamic significant.  -has seen vascular outpatient 08/2020 recommended compressions and he had triphasic waveforms at level of ankle bilaterally.  -recommend f;u with vascular outpatient for possible consideration of angiography if he has worsening edema despite adequate volume control with dialysis  -11/29 counseled patient at length concerning his chronic lower extremity pain secondary to longstanding peripheral vascular disease and HD.  Currently no good definitive treatment, will treat for symptoms. - 11/29 TED hose knee-high 12 hours on/12 hours off  Bilateral lower extremity pain - Bilateral lower extremity pain, swelling LEFT>>> RIGHT -11/28 Doppler US pending     ESRD on hemodialysis (HD T/Th/S -He has dialysis on Tuesday/Thursdays and Saturdays. After discussion with nephrology has only had 4 hours of dialysis over past week -Nephrology has been consulted -no signs of volume overload (LE edema seems to be at baseline)  -intake/output.    Transaminitis-chronic  Chronic. Likely secondary to drinking and chronic HCV. Drinks a beer "every now and then" -acute hep  panel checked in 12/2020. Reactive HCV ab -[place]: 10/22-cholelithiasis with associated nonspecific gallbladder wall thickening. Negative murphy sign on exam.      DM2 (diabetes mellitus, type 2) (HCC) A1c: 5.4 in 09/2020 Diet controlled Accuchecks and sensitive SSI per protocol      Essential hypertension -some soft bp reads. MAR not done, will write for PRN hydralazine  QT interval prolongation - QTC borderline prolongation. - Judicious use of any QT prolonging medication especially inpatient with renal failure     History of alcohol abuse and cocaine abuse -remote hx  -UDS pending in setting of confusion/hallucinations  -MV, thiamine, folic acid  -no hx of  withdrawals, but CIWA protocol placed.      Chronic hepatitis C without hepatic coma (HCC) -stable     Nicotine dependence, cigarettes, uncomplicated -Nicotine patch and encouraged to quit.   Skin rash -Characteristic of perforating folliculitis    Generalized weakness  -PT eval for weakness   Morbidly obese (BMI 41.57 kg/m)  Goals of care - 11/28 consult LCSW: Homelessness.     DVT prophylaxis: Heparin subcu Code Status: Full Family Communication:  Status is: Inpatient    Dispo: The patient is from:  Homeless              Anticipated d/c is to:  Homeless              Anticipated d/c date is: 1 day              patient currently is not medically stable to d/c.      Consultants:  Nephrology GI  Procedures/Significant Events:  11/28 bilateral lower extremity ultrasound:No evidence of deep vein thrombosis seen in the lower extremities, bilaterally.  -No evidence of popliteal cyst, bilaterally.   11/29 Small Bowel Enteroscopy The examined esophagus was normal. Findings: - Normal esophagus. 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 - Mild, non-ulcer gastritis. Biopsied. - Mild inflammation with nodularity in the duodenal bulb. This overall appeared improved from images on the prior studies. - Two non-bleeding angioectasias in the duodenum. Treated with argon plasma coagulation (APC).    I have personally reviewed and interpreted all radiology studies and my findings are as above.  VENTILATOR SETTINGS:    Cultures   Antimicrobials:    Devices    LINES / TUBES:      Continuous Infusions:  sodium chloride     ferric gluconate (FERRLECIT) IVPB     sodium thiosulfate infusion for calciphylaxis       Objective: Vitals:   03/26/21 1010 03/26/21 1024 03/26/21 1039 03/26/21 1059  BP: (!) 91/47 101/60 (!) 107/51 (!) 136/54  Pulse: 88 85 84 80  Resp: 15 14 19 15   Temp: 97.6 F (36.4 C)  (!) 97 F (36.1 C) 97.6 F (36.4  C)  TempSrc:    Oral  SpO2: 100% 99% 100% 100%  Weight:      Height:        Intake/Output Summary (Last 24 hours) at 03/26/2021 1108 Last data filed at 03/26/2021 1003 Gross per 24 hour  Intake 980 ml  Output 1 ml  Net 979 ml    Filed Weights   03/25/21 0837 03/26/21 0505 03/26/21 0851  Weight: (!) 138.7 kg 129.9 kg 129.9 kg    Examination:  General: No acute respiratory distress Eyes: negative scleral hemorrhage, negative anisocoria, negative icterus ENT: Negative Runny nose, negative gingival bleeding, Neck:  Negative scars, masses, torticollis, lymphadenopathy, JVD Lungs: Clear to auscultation bilaterally without  wheezes or crackles Cardiovascular: Regular rate and rhythm without murmur gallop or rub normal S1 and S2 Abdomen: negative abdominal pain, nondistended, positive soft, bowel sounds, no rebound, no ascites, no appreciable mass Extremities: bilateral lower extremity edema LEFT>> RIGHT.  Pain to palpation calfs LEFT>> RIGHT Skin: Negative rashes, lesions, ulcers Psychiatric:  Negative depression, negative anxiety, negative fatigue, negative mania  Central nervous system:  Cranial nerves II through XII intact, tongue/uvula midline, all extremities muscle strength 5/5, sensation intact throughout, negative dysarthria, negative expressive aphasia, negative receptive aphasia.  .     Data Reviewed: Care during the described time interval was provided by me .  I have reviewed this patient's available data, including medical history, events of note, physical examination, and all test results as part of my evaluation.   CBC: Recent Labs  Lab 03/24/21 0917 03/24/21 1831 03/25/21 0138 03/25/21 0903 03/26/21 0138  WBC 5.2 5.4 5.7 4.9 4.8  NEUTROABS 2.3  --   --  2.4 2.5  HGB 6.8* 8.1* 8.1* 7.9* 8.2*  HCT 21.4* 26.2* 25.5* 24.7* 25.8*  MCV 105.4* 103.6* 101.6* 101.2* 101.2*  PLT 103* 107* 108* 106* 101*    Basic Metabolic Panel: Recent Labs  Lab 03/24/21 0917  03/24/21 1124 03/25/21 0138 03/25/21 0903 03/25/21 2205 03/26/21 0138  NA 142  --  138 142 138 137  K 4.3  --  4.0 4.4 4.0 4.0  CL 102  --  99 102 98 98  CO2 23  --  24 23 27 28   GLUCOSE 111*  --  105* 100* 97 87  BUN 40*  --  45* 48* 23 24*  CREATININE 11.59*  --  12.03* 12.65* 7.53* 7.89*  CALCIUM 8.6*  --  8.4* 8.6* 8.3* 8.4*  MG  --  2.2  --  2.4  --  2.0  PHOS  --   --   --  4.4 3.1 3.1    GFR: Estimated Creatinine Clearance: 13.5 mL/min (A) (by C-G formula based on SCr of 7.89 mg/dL (H)). Liver Function Tests: Recent Labs  Lab 03/24/21 0917 03/25/21 0138 03/25/21 0903 03/25/21 2205 03/26/21 0138  AST 51* 57* 57*  --  62*  ALT 19 20 20   --  21  ALKPHOS 64 62 64  --  67  BILITOT 0.5 0.7 0.6  --  0.6  PROT 6.8 7.2 7.0  --  7.4  ALBUMIN 2.1* 2.2* 2.1* 2.1* 2.2*    No results for input(s): LIPASE, AMYLASE in the last 168 hours. Recent Labs  Lab 03/24/21 1831  AMMONIA 23    Coagulation Profile: Recent Labs  Lab 03/24/21 1831  INR 1.0    Cardiac Enzymes: No results for input(s): CKTOTAL, CKMB, CKMBINDEX, TROPONINI in the last 168 hours. BNP (last 3 results) No results for input(s): PROBNP in the last 8760 hours. HbA1C: No results for input(s): HGBA1C in the last 72 hours. CBG: Recent Labs  Lab 03/25/21 2108 03/26/21 0617 03/26/21 0859 03/26/21 1010 03/26/21 1103  GLUCAP 110* 79 75 83 81    Lipid Profile: No results for input(s): CHOL, HDL, LDLCALC, TRIG, CHOLHDL, LDLDIRECT in the last 72 hours. Thyroid Function Tests: Recent Labs    03/24/21 1831  TSH 3.465    Anemia Panel: No results for input(s): VITAMINB12, FOLATE, FERRITIN, TIBC, IRON, RETICCTPCT in the last 72 hours. Urine analysis:    Component Value Date/Time   COLORURINE YELLOW 07/31/2019 2128   APPEARANCEUR CLEAR 07/31/2019 2128   LABSPEC 1.009 07/31/2019 2128  PHURINE 5.0 07/31/2019 2128   GLUCOSEU NEGATIVE 07/31/2019 2128   HGBUR SMALL (A) 07/31/2019 2128   BILIRUBINUR  NEGATIVE 07/31/2019 2128   [place] 07/31/2019 2128   PROTEINUR 100 (A) 07/31/2019 2128   UROBILINOGEN 0.2 12/31/2009 0746   NITRITE NEGATIVE 07/31/2019 2128   LEUKOCYTESUR NEGATIVE 07/31/2019 2128   Sepsis Labs: @LABRCNTIP (procalcitonin:4,lacticidven:4)  ) Recent Results (from the past 240 hour(s))  Resp Panel by RT-PCR (Flu A&B, Covid) Nasopharyngeal Swab     Status: None   Collection Time: 03/24/21  1:45 PM   Specimen: Nasopharyngeal Swab; Nasopharyngeal(NP) swabs in vial transport medium  Result Value Ref Range Status   SARS Coronavirus 2 by RT PCR NEGATIVE NEGATIVE Final    Comment: (NOTE) SARS-CoV-2 target nucleic acids are NOT DETECTED.  The SARS-CoV-2 RNA is generally detectable in upper respiratory specimens during the acute phase of infection. The lowest concentration of SARS-CoV-2 viral copies this assay can detect is 138 copies/mL. A negative result does not preclude SARS-Cov-2 infection and should not be used as the sole basis for treatment or other patient management decisions. A negative result may occur with  improper specimen collection/handling, submission of specimen other than nasopharyngeal swab, presence of viral mutation(s) within the areas targeted by this assay, and inadequate number of viral copies(<138 copies/mL). A negative result must be combined with clinical observations, patient history, and epidemiological information. The expected result is Negative.  Fact Sheet for Patients:  EntrepreneurPulse.com.au  Fact Sheet for Healthcare Providers:  IncredibleEmployment.be  This test is no t yet approved or cleared by the [place] FDA and  has been authorized for detection and/or diagnosis of SARS-CoV-2 by FDA under an Emergency Use Authorization (EUA). This EUA will remain  in effect (meaning this test can be used) for the duration of the COVID-19 declaration under Section 564(b)(1) of the Act,  21 U.S.C.section 360bbb-3(b)(1), unless the authorization is terminated  or revoked sooner.       Influenza A by PCR NEGATIVE NEGATIVE Final   Influenza B by PCR NEGATIVE NEGATIVE Final    Comment: (NOTE) The Xpert Xpress SARS-CoV-2/FLU/RSV plus assay is intended as an aid in the diagnosis of influenza from Nasopharyngeal swab specimens and should not be used as a sole basis for treatment. Nasal washings and aspirates are unacceptable for Xpert Xpress SARS-CoV-2/FLU/RSV testing.  Fact Sheet for Patients: EntrepreneurPulse.com.au  Fact Sheet for Healthcare Providers: IncredibleEmployment.be  This test is not yet approved or cleared by the [place] FDA and has been authorized for detection and/or diagnosis of SARS-CoV-2 by FDA under an Emergency Use Authorization (EUA). This EUA will remain in effect (meaning this test can be used) for the duration of the COVID-19 declaration under Section 564(b)(1) of the Act, 21 U.S.C. section 360bbb-3(b)(1), unless the authorization is terminated or revoked.  Performed at [facility], [place] [address], [place], [place] [address]          Radiology Studies: DG Chest 2 View  Result Date: 03/24/2021 CLINICAL DATA:  Weakness.  Seizures. EXAM: CHEST - 2 VIEW COMPARISON:  Radiographs 03/17/2021 and CT 06/29/2019. FINDINGS: Stable cardiomegaly and aortic atherosclerosis. There is vascular congestion without overt pulmonary edema, confluent airspace opacity, pneumothorax or significant pleural effusion. Mild degenerative changes are present within the spine. Telemetry leads overlie the chest. Vascular stent noted overlying the left shoulder. IMPRESSION: Cardiomegaly with chronic vascular congestion. No acute findings identified. Electronically Signed   By: Richardean Sale M.D.   On: 03/24/2021 12:30   CT  HEAD WO CONTRAST (5MM)  Result Date: 03/24/2021 CLINICAL DATA:  Seizures with increased  twitching and hallucinations. Recent medication change. EXAM: CT HEAD WITHOUT CONTRAST TECHNIQUE: Contiguous axial images were obtained from the base of the skull through the vertex without intravenous contrast. COMPARISON:  CT head 01/10/2021 FINDINGS: Brain: There is no evidence of acute intracranial hemorrhage, mass lesion, brain edema or extra-axial fluid collection. Stable mild atrophy with prominence of the ventricles and subarachnoid spaces. Stable mild chronic small vessel ischemic changes in the periventricular white matter. There is no CT evidence of acute cortical infarction. Vascular: Prominent intracranial vascular calcifications. No hyperdense vessel identified. Skull: Negative for fracture or focal lesion. Sinuses/Orbits: The visualized paranasal sinuses and mastoid air cells are clear. No orbital abnormalities are seen. Other: None. IMPRESSION: Stable head CT.  No acute intracranial findings. Electronically Signed   By: Richardean Sale M.D.   On: 03/24/2021 15:41   VAS [place] LOWER EXTREMITY VENOUS (DVT)  Result Date: 03/25/2021  Lower Venous DVT Study Patient Name:  Daniel [name]  Date of Exam:   03/25/2021 Medical Rec #: [id number]           Accession #:    [id number] Date of Birth: 1961/01/28           Patient Gender: M Patient Age:   68 years Exam Location:  [facility] Procedure:      VAS [place] LOWER EXTREMITY VENOUS (DVT) Referring Phys: Vicente Serene Kycen Spalla --------------------------------------------------------------------------------  Indications: Pain.  Comparison Study: prior 03/19/21 Performing Technologist: Archie Patten RVS  Examination Guidelines: A complete evaluation includes B-mode imaging, spectral Doppler, color Doppler, and power Doppler as needed of all accessible portions of each vessel. Bilateral testing is considered an integral part of a complete examination. Limited examinations for reoccurring indications may be performed as noted. The reflux portion of the exam is  performed with the patient in reverse Trendelenburg.  +---------+---------------+---------+-----------+----------+--------------+ RIGHT    CompressibilityPhasicitySpontaneityPropertiesThrombus Aging +---------+---------------+---------+-----------+----------+--------------+ CFV      Full           Yes      Yes                                 +---------+---------------+---------+-----------+----------+--------------+ SFJ      Full                                                        +---------+---------------+---------+-----------+----------+--------------+ FV Prox  Full                                                        +---------+---------------+---------+-----------+----------+--------------+ FV Mid   Full                                                        +---------+---------------+---------+-----------+----------+--------------+ FV DistalFull                                                        +---------+---------------+---------+-----------+----------+--------------+  PFV      Full                                                        +---------+---------------+---------+-----------+----------+--------------+ POP      Full           Yes      Yes                                 +---------+---------------+---------+-----------+----------+--------------+ PTV      Full                                                        +---------+---------------+---------+-----------+----------+--------------+ PERO     Full                                                        +---------+---------------+---------+-----------+----------+--------------+   +---------+---------------+---------+-----------+----------+--------------+ LEFT     CompressibilityPhasicitySpontaneityPropertiesThrombus Aging +---------+---------------+---------+-----------+----------+--------------+ CFV      Full           Yes      Yes                                  +---------+---------------+---------+-----------+----------+--------------+ SFJ      Full                                                        +---------+---------------+---------+-----------+----------+--------------+ FV Prox  Full                                                        +---------+---------------+---------+-----------+----------+--------------+ FV Mid   Full                                                        +---------+---------------+---------+-----------+----------+--------------+ FV DistalFull                                                        +---------+---------------+---------+-----------+----------+--------------+ PFV      Full                                                        +---------+---------------+---------+-----------+----------+--------------+   POP      Full           Yes      Yes                                 +---------+---------------+---------+-----------+----------+--------------+ PTV      Full                                                        +---------+---------------+---------+-----------+----------+--------------+ PERO     Full                                                        +---------+---------------+---------+-----------+----------+--------------+     Summary: BILATERAL: - No evidence of deep vein thrombosis seen in the lower extremities, bilaterally. -No evidence of popliteal cyst, bilaterally.   *See table(s) above for measurements and observations. Electronically signed by Monica Martinez MD on 03/25/2021 at 4:44:01 PM.    Final         Scheduled Meds:  Chlorhexidine Gluconate Cloth  6 each Topical Q0600   doxercalciferol  3 mcg Intravenous Q T,Th,Sa-HD   folic acid  1 mg Oral Daily   heparin injection (subcutaneous)  5,000 Units Subcutaneous Q12H   insulin aspart  0-6 Units Subcutaneous TID WC   multivitamin with minerals  1 tablet Oral Daily   nicotine  21 mg  Transdermal Daily   pantoprazole  40 mg Oral BID   sodium chloride flush  3 mL Intravenous Q12H   thiamine  100 mg Oral Daily   Continuous Infusions:  sodium chloride     ferric gluconate (FERRLECIT) IVPB     sodium thiosulfate infusion for calciphylaxis       LOS: 2 days   The patient is critically ill with multiple organ systems failure and requires high complexity decision making for assessment and support, frequent evaluation and titration of therapies, application of advanced monitoring technologies and extensive interpretation of multiple databases. Critical Care Time devoted to patient care services described in this note  Time spent: 40 minutes     Cordarryl Monrreal, Geraldo Docker, MD [facility]   If 7PM-7AM, please contact night-coverage 03/26/2021, 11:08 AM

## 2021-03-27 ENCOUNTER — Ambulatory Visit: Payer: Medicare Other | Admitting: Gastroenterology

## 2021-03-27 DIAGNOSIS — K2990 Gastroduodenitis, unspecified, without bleeding: Secondary | ICD-10-CM

## 2021-03-27 DIAGNOSIS — E1169 Type 2 diabetes mellitus with other specified complication: Secondary | ICD-10-CM

## 2021-03-27 DIAGNOSIS — E8729 Other acidosis: Secondary | ICD-10-CM

## 2021-03-27 LAB — SURGICAL PATHOLOGY

## 2021-03-27 LAB — CBC WITH DIFFERENTIAL/PLATELET
Abs Immature Granulocytes: 0.03 10*3/uL (ref 0.00–0.07)
Basophils Absolute: 0 10*3/uL (ref 0.0–0.1)
Basophils Relative: 0 %
Eosinophils Absolute: 0.1 10*3/uL (ref 0.0–0.5)
Eosinophils Relative: 2 %
HCT: 27.1 % — ABNORMAL LOW (ref 39.0–52.0)
Hemoglobin: 8.6 g/dL — ABNORMAL LOW (ref 13.0–17.0)
Immature Granulocytes: 1 %
Lymphocytes Relative: 32 %
Lymphs Abs: 1.6 10*3/uL (ref 0.7–4.0)
MCH: 32.5 pg (ref 26.0–34.0)
MCHC: 31.7 g/dL (ref 30.0–36.0)
MCV: 102.3 fL — ABNORMAL HIGH (ref 80.0–100.0)
Monocytes Absolute: 0.6 10*3/uL (ref 0.1–1.0)
Monocytes Relative: 13 %
Neutro Abs: 2.6 10*3/uL (ref 1.7–7.7)
Neutrophils Relative %: 52 %
Platelets: 96 10*3/uL — ABNORMAL LOW (ref 150–400)
RBC: 2.65 MIL/uL — ABNORMAL LOW (ref 4.22–5.81)
RDW: 19.6 % — ABNORMAL HIGH (ref 11.5–15.5)
WBC: 4.9 10*3/uL (ref 4.0–10.5)
nRBC: 0.6 % — ABNORMAL HIGH (ref 0.0–0.2)

## 2021-03-27 LAB — COMPREHENSIVE METABOLIC PANEL
ALT: 22 U/L (ref 0–44)
AST: 68 U/L — ABNORMAL HIGH (ref 15–41)
Albumin: 2.4 g/dL — ABNORMAL LOW (ref 3.5–5.0)
Alkaline Phosphatase: 74 U/L (ref 38–126)
Anion gap: 12 (ref 5–15)
BUN: 14 mg/dL (ref 8–23)
CO2: 26 mmol/L (ref 22–32)
Calcium: 8.7 mg/dL — ABNORMAL LOW (ref 8.9–10.3)
Chloride: 98 mmol/L (ref 98–111)
Creatinine, Ser: 5.96 mg/dL — ABNORMAL HIGH (ref 0.61–1.24)
GFR, Estimated: 10 mL/min — ABNORMAL LOW (ref >60)
Glucose, Bld: 115 mg/dL — ABNORMAL HIGH (ref 70–99)
Potassium: 3.7 mmol/L (ref 3.5–5.1)
Sodium: 136 mmol/L (ref 135–145)
Total Bilirubin: 0.6 mg/dL (ref 0.3–1.2)
Total Protein: 7.8 g/dL (ref 6.5–8.1)

## 2021-03-27 LAB — GLUCOSE, CAPILLARY
Glucose-Capillary: 90 mg/dL (ref 70–99)
Glucose-Capillary: 139 mg/dL — ABNORMAL HIGH (ref 70–99)

## 2021-03-27 LAB — PHOSPHORUS: Phosphorus: 3.4 mg/dL (ref 2.5–4.6)

## 2021-03-27 LAB — MAGNESIUM: Magnesium: 1.9 mg/dL (ref 1.7–2.4)

## 2021-03-27 LAB — HEPATITIS B SURFACE ANTIBODY, QUANTITATIVE: Hep B S AB Quant (Post): 178.6 m[IU]/mL (ref >9.9)

## 2021-03-27 MED ORDER — QUETIAPINE FUMARATE 50 MG PO TABS: 50.0000 mg | ORAL_TABLET | Freq: Every day | ORAL | 0 refills | Status: DC

## 2021-03-27 NOTE — Progress Notes (Signed)
[facility] Progress Note  Subjective: seen in room, no c/o's  Vitals:   03/26/21 1952 03/26/21 2314 03/27/21 0501 03/27/21 0733  BP: 124/81 134/76 126/78 130/77  Pulse: 93 98 85 92  Resp: 20 14 14 16   Temp: 97.9 F (36.6 C) 98 F (36.7 C) (!) 97.4 F (36.3 C) (!) 97.4 F (36.3 C)  TempSrc: Oral Oral Oral Oral  SpO2: 99% 98% 98% 98%  Weight:   127.7 kg   Height:        Exam:  alert, nad   no jvd  Chest cta bilat  Cor reg no RG  Abd soft ntnd no ascites   Ext 1-2+ LE edema   Alert, NF, ox3    OP HD: SW TTS  4h  450/500  129kg  2/2 bath L AVF  Hep none -Mircera 225 mcg IV q 2 weeks (last dose 03/02/2021) -Venofer 100 mg IV X 10 doses (1/10 doses given) -Hectorol 3 mcg IV TIW   Assessment/Plan:  Symptomatic anemia: HGB 6.8 on adm. Last OP HGB 7.5 03/14/2021, Probably recurrent UGIB. 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  Also has not had ESA in OP center since 03/02/2021 D/T missed and truncated treatments. Hb stable 8-9 now.   Visual Hallucinations: H/O polysubstance abuse. BUN 43 SCr 11.2. Only 4.7 hr of 12 hours weekly HD rec'd this week. Lack of dialysis probably contributing. Gabapentin dose at OP clinic was 100 mg PO BID. Has been prescribed carafate by GI in past which was dc'd. Should not be on carafate for longer than 2 weeks D/T risk of aluminum toxicity. MS back to baseline.   ESRD - usual HD is TTS. Had HD Monday and Tuesday here this week.   Hypertension/volume  - BP controlled, home clonidine/ norvasc on hold, BP's are wnl, prob due to resolved vol overload (8kg down after HD x 2 here). Is under dry wt 1 kg today. Lower edw at dc.   Anemia  - As noted above. Give ESA with HD tomorrow. Was getting Fe load at [place] for Tsat 28 03/14/2021. Will continue.   Metabolic bone disease -  Continue VDRA, resume binders when eating.  Nutrition - Clear liquids.  H/O calciphylaxis. Continue sodium thiosulfate. Avoid  calcium based medications.  DMT2-per primary Dispo - have d/w pmd, going home today     Daniel [name] 03/27/2021, 11:21 AM   Recent Labs  Lab 03/26/21 0138 03/27/21 0131  K 4.0 3.7  BUN 24* 14  CREATININE 7.89* 5.96*  CALCIUM 8.4* 8.7*  PHOS 3.1 3.4  HGB 8.2* 8.6*    Inpatient medications:  Chlorhexidine Gluconate Cloth  6 each Topical Q0600   doxercalciferol  3 mcg Intravenous Q T,Th,Sa-HD   folic acid  1 mg Oral Daily   heparin injection (subcutaneous)  5,000 Units Subcutaneous Q12H   insulin aspart  0-6 Units Subcutaneous TID WC   multivitamin  1 tablet Oral QHS   nicotine  21 mg Transdermal Daily   pantoprazole  40 mg Oral BID   QUEtiapine  50 mg Oral QHS   sodium chloride flush  3 mL Intravenous Q12H   thiamine  100 mg Oral Daily    sodium chloride     ferric gluconate (FERRLECIT) IVPB Stopped (03/26/21 1703)   sodium thiosulfate infusion for calciphylaxis     sodium chloride, acetaminophen **OR** acetaminophen, cyclobenzaprine, diphenhydrAMINE-zinc acetate, hydrALAZINE, HYDROcodone-acetaminophen, LORazepam **OR** LORazepam, sodium chloride flush

## 2021-03-27 NOTE — Progress Notes (Signed)
Physical Therapy Treatment & Discharge Patient Details Name: Daniel [name] MRN: [id number] DOB: [birth year] Today's Date: 03/27/2021   History of Present Illness Pt is a 60 y.o. male admitted 03/24/21 with 1-wk h/o progressive weakness, confusion, visual hallucinations, and new "involuntary jerking" of UEs. Workup for upper GI bleed, anemia. CT negative for acute intracranial abnormalities. S/p small bowel endoscopy 11/29. PMH includes DM2, Hep C, HTN, ESRD (HD TTS), ICH, remote substance abuse.   PT Comments    Pt progressing well with mobility; mod indep with RW use. Educ re: activity recommendations, DME needs for fall risk reduction, importance of mobility. Pt has met short-term acute PT goals, reports no further questions or concerns. Will d/c acute PT.    Recommendations for follow up therapy are one component of a multi-disciplinary discharge planning process, led by the attending physician.  Recommendations may be updated based on patient status, additional functional criteria and insurance authorization.  Follow Up Recommendations  No PT follow up     Assistance Recommended at Discharge PRN  Equipment Recommendations  None recommended by PT    Recommendations for Other Services       Precautions / Restrictions Precautions Precautions: Fall Restrictions Weight Bearing Restrictions: No RLE Weight Bearing: Weight bearing as tolerated LLE Weight Bearing: Weight bearing as tolerated     Mobility  Bed Mobility Overal bed mobility: Modified Independent                  Transfers Overall transfer level: Modified independent Equipment used: None;Rolling walker (2 wheels) Transfers: Sit to/from Stand                  Ambulation/Gait Ambulation/Gait assistance: Modified independent (Device/Increase time) Gait Distance (Feet): 200 Feet Assistive device: Rolling walker (2 wheels) Gait Pattern/deviations: Step-through pattern;Decreased stride length;Wide  base of support Gait velocity: Decreased     General Gait Details: Mod indep with RW, pt reports he plans to use his walker at home. Able to take a few steps around room without DME, supervision for safety. Encouraged RW use   Stairs Stairs:  (pt declined)           Engineer, building services [place] (Stroke Patients Only)       Balance Overall balance assessment: Modified Independent   Sitting balance-Leahy Scale: Good Sitting balance - Comments: indep to don pants and shoes sitting EOB     Standing balance-Leahy Scale: Fair                              Cognition Arousal/Alertness: Awake/alert Behavior During Therapy: WFL for tasks assessed/performed Overall Cognitive Status: Within Functional Limits for tasks assessed                                          Exercises      General Comments General comments (skin integrity, edema, etc.): pt hopeful for d/c home today; reviewed educ on activity recommendations, DME use for fall risk reduction,importance of mobility; pt reports no questions or concerns      Pertinent Vitals/Pain Pain Assessment: Faces Faces Pain Scale: Hurts a little bit Pain Location: L ankle Pain Descriptors / Indicators: Sore Pain Intervention(s): Monitored during session    Home Living  Prior Function            PT Goals (current goals can now be found in the care plan section) Progress towards PT goals: Goals met/education completed, patient discharged from PT    Frequency    Min 3X/week      PT Plan Current plan remains appropriate    Co-evaluation              AM-PAC PT "6 Clicks" Mobility   Outcome Measure  Help needed turning from your back to your side while in a flat bed without using bedrails?: None Help needed moving from lying on your back to sitting on the side of a flat bed without using bedrails?: None Help needed moving to and from  a bed to a chair (including a wheelchair)?: None Help needed standing up from a chair using your arms (e.g., wheelchair or bedside chair)?: None Help needed to walk in hospital room?: None Help needed climbing 3-5 steps with a railing? : A Little 6 Click Score: 23    End of Session   Activity Tolerance: Patient tolerated treatment well Patient left: in bed;with call bell/phone within reach Nurse Communication: Mobility status PT Visit Diagnosis: Unsteadiness on feet (R26.81);Other abnormalities of gait and mobility (R26.89);Muscle weakness (generalized) (M62.81)     Time: 7737-5051 PT Time Calculation (min) (ACUTE ONLY): 13 min  Charges:  $Gait Training: 8-22 mins                     Mabeline Caras, PT, DPT Acute Rehabilitation Services  Pager [phone] Office [place] 03/27/2021, 12:25 PM

## 2021-03-27 NOTE — Progress Notes (Signed)
Pt d/c to home today. Contacted [facility] to make clinic aware of pt's d/c today and to resume care tomorrow.   Melven Sartorius Renal Navigator [phone]

## 2021-03-27 NOTE — Discharge Summary (Signed)
Physician Discharge Summary  Daniel [name] NWG:[id number] DOB: [birth year] DOA: 03/24/2021  PCP: [name], NP  Admit date: 03/24/2021 Discharge date: 03/27/2021  Admitted From: Home Disposition:  home   Recommendations for Outpatient Follow-up and new medication changes:  Follow up with [name] NP in 7 to 10 days.  Consider resumption of erythrocyte stimulation agent on hemodialysis.  Follow-up cell count within 7-10 days as an outpatient. Continue pantoprazole twice daily for 6 weeks then reduce to once daily. Discontinue doxepin and started on quetiapine at night 50 mg.  Follow up with psychiatry as outpatient.  Follow up with GI for gastric biopsy report.   Home Health: no   Equipment/Devices: na    Discharge Condition: stable  CODE STATUS: full  Diet recommendation:  heart healthy   Brief/Interim Summary: Daniel [name] was admitted to the hospital with a working diagnosis of symptomatic anemia, due to acute blood loss, upper GI bleed.  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  He reported involuntary jerking of his upper extremities, worsening lower extremity edema and generalized weakness.  Positive hallucinations.  On his initial physical examination temperature 99.7, heart rate 88, blood pressure 104/53, respirate 20, oxygen saturation 97% on room air.  Positive palpebral edema, lungs were clear to auscultation bilaterally, heart S1-S2, present, rhythmic, positive 3+ pitting about the ankle lower extremity edema.  Sodium 142, potassium 4.3, chloride 102, bicarb 23, glucose 111, BUN 40, creatinine 11.59, AST 51, ALT 19, white count 5.2, hemoglobin 6.8, hematocrit 21.4, platelets 103. SARS COVID-19 negative.  Head CT no acute changes. Chest radiograph with  cardiomegaly, mild hilar vascular congestion.  EKG 79 bpm, normal axis, normal intervals, sinus rhythm, no significant ST segment or T wave changes.  Patient received 1 unit of packed red blood cells with good toleration.  He was placed on intravenous pantoprazole. 11/29 had small bowel enteroscopy showing normal esophagus.  Mild nonerosive gastritis.  Nonbleeding angiectasia's in the duodenum.  Treated with argon plasma coagulation.  His hemoglobin and hematocrit remained stable.  He underwent hemodialysis with ultrafiltration with improvement of volume status.  Acute on chronic anemia, acute blood loss due to duodenal angiectasia/AVM.  Chronic thrombocytopenia. Patient received 1 unit packed red blood cell with good toleration, initially placed on intravenous pantoprazole now transition to oral pantoprazole.  Plan to continue for 6 weeks twice daily dosing and then reduce to once daily.  Follow-up cell count as an outpatient.  To consider erythropoietin as an outpatient during hemodialysis. Patient received IV iron on HD for iron deficiency anemia.   At discharge hemoglobin is 8.6, hematocrit 27.1 and platelets 96.  2.  Toxic/metabolic encephalopathy.  Patient with a jerking movements at home and visual hallucinations.  Psychiatry was consulted with recommendations to discontinue gabapentin.  Continue Seroquel 50 mg nightly. Avoid sucralfate due to possible element and toxicity.  3.  End-stage renal disease on hemodialysis, hypervolemia.  Patient underwent hemodialysis ultrafiltration, improved volume status.  Follow-up as an outpatient. Further work-up with ultrasonography lower extremities was negative for deep vein thrombosis. Metabolic bone disease continue with phoslo and doxercalciferol.   4.  Type II diabetes mellitus.  His glucose remained well controlled during his hospitalization.  5.  Hypertension.  His blood pressure remained well controlled during his hospitalization,  continue hemodialysis with ultrafiltration. At discharge will continue with amlodipine and clonidine  with close follow up of bleed pressure as outpatient.   6.  Obesity class II.  Calculated BMI 39.2.  7.  Nicotine dependence, history of alcohol and cocaine abuse.  No signs of acute withdrawal, follow-up as an outpatient.  Discharge Diagnoses:  Principal Problem:   Acute on chronic anemia Active Problems:   Essential hypertension   DM2 (diabetes mellitus, type 2) (HCC)   Chronic hepatitis C without hepatic coma (HCC)   ESRD on hemodialysis (HCC)   Nicotine dependence, cigarettes, uncomplicated   Upper GI bleed   Thrombocytopenia (HCC)   AVM (arteriovenous malformation) of duodenum, acquired   Peptic ulcer disease   End-stage renal disease on hemodialysis ([place])   Diabetes mellitus type 2, controlled, with complications ([place])   Cocaine abuse ([place])   Nicotine abuse   Morbidly obese ([place])   Gastritis and gastroduodenitis    Discharge Instructions   Allergies as of 03/27/2021       Reactions   Dilaudid [hydromorphone Hcl] Itching, Other (See Comments)   Pt reports itchiness after IM injection         Medication List     STOP taking these medications    doxepin 10 MG capsule Commonly known as: SINEQUAN   gabapentin 300 MG capsule Commonly known as: NEURONTIN       TAKE these medications    amLODipine 10 MG tablet Commonly known as: NORVASC Take 1 tablet (10 mg total) by mouth at bedtime.   blood glucose meter kit and supplies Kit Dispense based on patient and insurance preference. Use up to four times daily as directed. (FOR ICD-9 250.00, 250.01).   calcium acetate 667 MG capsule Commonly known as: PHOSLO Take 1,334 mg by mouth 3 (three) times daily with meals.   cloNIDine 0.1 MG tablet Commonly known as: CATAPRES Take 0.1 mg by mouth as directed. TAKE 1 TABLET BID BUT ON DIALYSIS DAYS TAKE ONLY AT BEDTIME. What changed: Another medication with the  same name was removed. Continue taking this medication, and follow the directions you see here.   HYDROcodone-acetaminophen 5-325 MG tablet Commonly known as: NORCO/VICODIN Take 1 tablet by mouth every 4 (four) hours as needed.   hydrOXYzine 25 MG tablet Commonly known as: ATARAX TAKE 1 TABLET BY MOUTH EVERY 8 HOURS AS NEEDED FOR ANXIETY OR  ITCHING What changed: See the new instructions.   lidocaine-prilocaine cream Commonly known as: EMLA Apply 1 application topically once.   pantoprazole 40 MG tablet Commonly known as: PROTONIX Take 1 tablet (40 mg total) by mouth 2 (two) times daily.   QUEtiapine 50 MG tablet Commonly known as: SEROQUEL Take 1 tablet (50 mg total) by mouth at bedtime.        Allergies  Allergen Reactions   Dilaudid [Hydromorphone Hcl] Itching and Other (See Comments)    Pt reports itchiness after IM injection     Consultations: GI Nephrology    Procedures/Studies: CT ABDOMEN PELVIS WO CONTRAST  Result Date: 03/13/2021 CLINICAL DATA:  60 year old male with generalized abdominal pain and bright red blood in stool x3 days. EXAM: CT ABDOMEN AND PELVIS WITHOUT CONTRAST TECHNIQUE: Multidetector CT imaging of the abdomen and pelvis was performed following the standard protocol without IV contrast. COMPARISON:  Noncontrast CT Abdomen and Pelvis 02/07/2021 and earlier. CT Abdomen and Pelvis with contrast 06/29/2016. FINDINGS: Lower chest: Calcified coronary artery atherosclerosis. Stable cardiac size at the upper limits of normal. No pericardial or pleural effusion. Stable mild lung base scarring. Hepatobiliary: Chronic cholelithiasis but the gallbladder  is partially contracted. Still, there is an indistinct appearance of the gallbladder wall similar to the CT last month. Noncontrast liver appears stable with evidence of patchy steatosis and occasional punctate calcified granulomas. Pancreas: Negative noncontrast pancreas. Spleen: Negative. Adrenals/Urinary  Tract: 2 mm left lower pole renal calculus is stable. No obstructive uropathy. And otherwise negative. Stomach/Bowel: Mild diverticulosis of the descending and sigmoid colon with no active inflammation. Otherwise negative large bowel. Diminutive or absent appendix. Decompressed terminal ileum. No dilated small bowel. Negative stomach and duodenum. No free air or free fluid. Vascular/Lymphatic: Diffuse calcified atherosclerosis throughout the abdomen and pelvis. Normal caliber abdominal aorta. Vascular patency is not evaluated in the absence of IV contrast. No lymphadenopathy. Reproductive: Negative. Other: No pelvic free fluid. Musculoskeletal: Chronic disc and endplate degeneration in the thoracic spine. 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 Asymmetric chronic hip joint degeneration. No acute osseous abnormality identified. IMPRESSION: 1. Chronic cholelithiasis, with indistinct appearance of the gallbladder wall similar to the CT last month. However, relatively contracted state of the gallbladder argues against Acute Cholecystitis. But if there is right upper quadrant pain recommend Right Upper Quadrant Ultrasound may be valuable. 2. No other acute or inflammatory process identified in the non-contrast abdomen or pelvis. 3. Diffuse calcified atherosclerosis, Aortic atherosclerosis (ICD10-I70.0). Left nephrolithiasis. Mild large bowel diverticulosis. Electronically Signed   By: Genevie Ann M.D.   On: 03/13/2021 06:35   DG Chest 2 View  Result Date: 03/24/2021 CLINICAL DATA:  Weakness.  Seizures. EXAM: CHEST - 2 VIEW COMPARISON:  Radiographs 03/17/2021 and CT 06/29/2019. FINDINGS: Stable cardiomegaly and aortic atherosclerosis. There is vascular congestion without overt pulmonary edema, confluent airspace opacity, pneumothorax or significant pleural effusion. Mild degenerative changes are present within the spine. Telemetry leads overlie the chest. Vascular stent noted overlying the left shoulder.  IMPRESSION: Cardiomegaly with chronic vascular congestion. No acute findings identified. Electronically Signed   By: Richardean Sale M.D.   On: 03/24/2021 12:30   CT HEAD WO CONTRAST (5MM)  Result Date: 03/24/2021 CLINICAL DATA:  Seizures with increased twitching and hallucinations. Recent medication change. EXAM: CT HEAD WITHOUT CONTRAST TECHNIQUE: Contiguous axial images were obtained from the base of the skull through the vertex without intravenous contrast. COMPARISON:  CT head 01/10/2021 FINDINGS: Brain: There is no evidence of acute intracranial hemorrhage, mass lesion, brain edema or extra-axial fluid collection. Stable mild atrophy with prominence of the ventricles and subarachnoid spaces. Stable mild chronic small vessel ischemic changes in the periventricular white matter. There is no CT evidence of acute cortical infarction. Vascular: Prominent intracranial vascular calcifications. No hyperdense vessel identified. Skull: Negative for fracture or focal lesion. Sinuses/Orbits: The visualized paranasal sinuses and mastoid air cells are clear. No orbital abnormalities are seen. Other: None. IMPRESSION: Stable head CT.  No acute intracranial findings. Electronically Signed   By: Richardean Sale M.D.   On: 03/24/2021 15:41   CT Angio Aortobifemoral W and/or Wo Contrast  Result Date: 03/19/2021 CLINICAL DATA:  Bilateral foot swelling, arterial embolism EXAM: CT ANGIOGRAPHY OF ABDOMINAL AORTA WITH ILIOFEMORAL RUNOFF TECHNIQUE: Multidetector CT imaging of the abdomen, pelvis and lower extremities was performed using the standard protocol during bolus administration of intravenous contrast. Multiplanar CT image reconstructions and MIPs were obtained to evaluate the vascular anatomy. CONTRAST:  67m OMNIPAQUE IOHEXOL 350 MG/ML SOLN COMPARISON:  03/13/2021 FINDINGS: VASCULAR Aorta: Moderate calcified atheromatous plaque. No aneurysm, dissection, or stenosis. Celiac: Patent without evidence of aneurysm,  dissection, vasculitis or significant stenosis. SMA: Scattered calcified plaque. No  aneurysm, dissection, or stenosis. Classic distal branch anatomy. Renals: Single bilaterally, both with calcified ostial plaque but no high-grade stenosis. IMA: Patent without evidence of aneurysm, dissection, vasculitis or significant stenosis. RIGHT Lower Extremity Inflow: Common iliac atheromatous, patent Internal iliac atheromatous, patent External iliac calcified plaque with short-segment stenosis in its midportion of possible hemodynamic significance Outflow: Common femoral heavily atheromatous, patent Deep femoral branches atheromatous, patent SFA extensive calcified plaque with calcified stenosis in its midportion over short segment, and tandem regions of at least mild stenosis in its distal half. There is poor contrast opacification limiting accurate assessment of stenosis severity. Popliteal scattered calcified plaque with tandem stenosis of at least mild severity, limited assessment due to poor contrast enhancement Runoff: Heavily calcification throughout the trifurcation vessels. Limited contrast enhancement further stymies evaluation of patency. LEFT Lower Extremity Inflow: Moderate calcified plaque throughout the iliac arterial system without high-grade stenosis or aneurysm. Outflow: Common femoral  atheromatous, patent Deep femoral branches atheromatous, patent SFA extensive calcified plaque with calcified tandem mid and distal stenoses of at least moderate severity. There is poor contrast opacification limiting accurate assessment of stenosis severity. Popliteal extensive calcified plaque with tandem stenosis of at least mild severity, limited assessment due to poor contrast enhancement Runoff: Heavily calcification throughout the trifurcation vessels. Limited contrast enhancement further stymies evaluation of patency. Veins: No obvious venous abnormality within the limitations of this arterial phase study. Review of  the MIP images confirms the above findings. NON-VASCULAR Lower chest: No pleural or pericardial effusion. Coronary calcifications. Hepatobiliary: 9 mm calcified gallstone in the neck of the nondistended gallbladder. No biliary ductal dilatation or focal liver lesion. Pancreas: Unremarkable. No pancreatic ductal dilatation or surrounding inflammatory changes. Spleen: Normal in size without focal abnormality. Adrenals/Urinary Tract: Adrenal glands unremarkable. 3 mm calcification in the lower pole left renal collecting system. No hydronephrosis. Urinary bladder incompletely distended. Stomach/Bowel: Stomach is incompletely distended, unremarkable. Small bowel decompressed. The colon is nondilated, unremarkable. Lymphatic: No abdominal or pelvic adenopathy. Reproductive: Prostate is unremarkable. Other: Left pelvic phleboliths.  No ascites.  No free air. Musculoskeletal: No fracture or worrisome bone lesion. Bone infarcts in the distal right femoral shaft and proximal left tibial shaft. Orthopedic hardware affixes the left calcaneus. IMPRESSION: 1. Short-segment stenosis in the mid right external iliac artery of possible hemodynamic significance. 2. Tandem stenoses in bilateral superficial femoral arteries of possible hemodynamic significance. 3. Heavy arterial calcification in bilateral popliteal arteries and tibial runoff, with limited contrast opacification limiting assessment of patency. MRA runoff may provide more accurate assessment of these segments. 4. Cholelithiasis. 5. Nonobstructive left nephrolithiasis. Electronically Signed   By: Lucrezia Europe M.D.   On: 03/19/2021 09:50   DG Chest Portable 1 View  Result Date: 03/17/2021 CLINICAL DATA:  Dyspnea EXAM: PORTABLE CHEST 1 VIEW COMPARISON:  02/23/2021 FINDINGS: Lungs are well expanded, symmetric, and clear. No pneumothorax or pleural effusion. Stable mild cardiomegaly. Pulmonary vascularity is normal. Osseous structures are age-appropriate. No acute bone  abnormality. IMPRESSION: No active disease.  Stable cardiomegaly Electronically Signed   By: Fidela Salisbury M.D.   On: 03/17/2021 22:56   VAS [place] LOWER EXTREMITY VENOUS (DVT)  Result Date: 03/25/2021  Lower Venous DVT Study Patient Name:  Daniel [name]  Date of Exam:   03/25/2021 Medical Rec #: [id number]           Accession #:    [id number] Date of Birth: [birth year]           Patient Gender: M Patient Age:  60 years Exam Location:  [facility] Procedure:      VAS [place] LOWER EXTREMITY VENOUS (DVT) Referring Phys: CURTIS WOODS --------------------------------------------------------------------------------  Indications: Pain.  Comparison Study: prior 03/19/21 Performing Technologist: Archie Patten RVS  Examination Guidelines: A complete evaluation includes B-mode imaging, spectral Doppler, color Doppler, and power Doppler as needed of all accessible portions of each vessel. Bilateral testing is considered an integral part of a complete examination. Limited examinations for reoccurring indications may be performed as noted. The reflux portion of the exam is performed with the patient in reverse Trendelenburg.  +---------+---------------+---------+-----------+----------+--------------+ RIGHT    CompressibilityPhasicitySpontaneityPropertiesThrombus Aging +---------+---------------+---------+-----------+----------+--------------+ CFV      Full           Yes      Yes                                 +---------+---------------+---------+-----------+----------+--------------+ SFJ      Full                                                        +---------+---------------+---------+-----------+----------+--------------+ FV Prox  Full                                                        +---------+---------------+---------+-----------+----------+--------------+ FV Mid   Full                                                         +---------+---------------+---------+-----------+----------+--------------+ FV DistalFull                                                        +---------+---------------+---------+-----------+----------+--------------+ PFV      Full                                                        +---------+---------------+---------+-----------+----------+--------------+ POP      Full           Yes      Yes                                 +---------+---------------+---------+-----------+----------+--------------+ PTV      Full                                                        +---------+---------------+---------+-----------+----------+--------------+ PERO     Full                                                        +---------+---------------+---------+-----------+----------+--------------+   +---------+---------------+---------+-----------+----------+--------------+  LEFT     CompressibilityPhasicitySpontaneityPropertiesThrombus Aging +---------+---------------+---------+-----------+----------+--------------+ CFV      Full           Yes      Yes                                 +---------+---------------+---------+-----------+----------+--------------+ SFJ      Full                                                        +---------+---------------+---------+-----------+----------+--------------+ FV Prox  Full                                                        +---------+---------------+---------+-----------+----------+--------------+ FV Mid   Full                                                        +---------+---------------+---------+-----------+----------+--------------+ FV DistalFull                                                        +---------+---------------+---------+-----------+----------+--------------+ PFV      Full                                                         +---------+---------------+---------+-----------+----------+--------------+ POP      Full           Yes      Yes                                 +---------+---------------+---------+-----------+----------+--------------+ PTV      Full                                                        +---------+---------------+---------+-----------+----------+--------------+ PERO     Full                                                        +---------+---------------+---------+-----------+----------+--------------+     Summary: BILATERAL: - No evidence of deep vein thrombosis seen in the lower extremities, bilaterally. -No evidence of popliteal cyst, bilaterally.   *See table(s) above for measurements and observations. Electronically signed by Monica Martinez MD on 03/25/2021 at 4:44:01 PM.  Final    VAS [place] LOWER EXTREMITY VENOUS (DVT) (7a-7p)  Result Date: 03/19/2021  Lower Venous DVT Study Patient Name:  Daniel [name]  Date of Exam:   03/19/2021 Medical Rec #: [id number]           Accession #:    [id number] Date of Birth: 1960/06/16           Patient Gender: M Patient Age:   79 years Exam Location:  [facility] Procedure:      VAS [place] LOWER EXTREMITY VENOUS (DVT) Referring Phys: JULIE HAVILAND --------------------------------------------------------------------------------  Indications: Bilateral foot pain & swelling.  Limitations: Calcific shadowing and poor ultrasound/tissue interface. Comparison Study: No previous exams Performing Technologist: Jody Hill RVT, RDMS  Examination Guidelines: A complete evaluation includes B-mode imaging, spectral Doppler, color Doppler, and power Doppler as needed of all accessible portions of each vessel. Bilateral testing is considered an integral part of a complete examination. Limited examinations for reoccurring indications may be performed as noted. The reflux portion of the exam is performed with the patient in reverse Trendelenburg.   +---------+---------------+---------+-----------+----------+--------------+ RIGHT    CompressibilityPhasicitySpontaneityPropertiesThrombus Aging +---------+---------------+---------+-----------+----------+--------------+ CFV      Full           Yes      Yes                                 +---------+---------------+---------+-----------+----------+--------------+ SFJ      Full                                                        +---------+---------------+---------+-----------+----------+--------------+ FV Prox  Full           Yes      Yes                                 +---------+---------------+---------+-----------+----------+--------------+ FV Mid   Full           Yes      Yes                                 +---------+---------------+---------+-----------+----------+--------------+ FV DistalFull           Yes      Yes                                 +---------+---------------+---------+-----------+----------+--------------+ PFV      Full                                                        +---------+---------------+---------+-----------+----------+--------------+ POP      Full           Yes      Yes                                 +---------+---------------+---------+-----------+----------+--------------+ PTV  Full                                                        +---------+---------------+---------+-----------+----------+--------------+ PERO                                                  Not visualized +---------+---------------+---------+-----------+----------+--------------+   Right Technical Findings: Not visualized segments include peroneal veins.  +---------+---------------+---------+-----------+----------+-------------------+ LEFT     CompressibilityPhasicitySpontaneityPropertiesThrombus Aging      +---------+---------------+---------+-----------+----------+-------------------+ CFV      Full           Yes       Yes                                      +---------+---------------+---------+-----------+----------+-------------------+ SFJ      Full                                                             +---------+---------------+---------+-----------+----------+-------------------+ FV Prox  Full           Yes      Yes                                      +---------+---------------+---------+-----------+----------+-------------------+ FV Mid   Full           Yes      Yes                                      +---------+---------------+---------+-----------+----------+-------------------+ FV DistalFull           Yes      Yes                                      +---------+---------------+---------+-----------+----------+-------------------+ PFV      Full                                                             +---------+---------------+---------+-----------+----------+-------------------+ POP      Full           Yes      Yes                                      +---------+---------------+---------+-----------+----------+-------------------+ PTV      Full  Not well visualized +---------+---------------+---------+-----------+----------+-------------------+ PERO     Full                                         Not well visualized +---------+---------------+---------+-----------+----------+-------------------+     Summary: BILATERAL: - No evidence of deep vein thrombosis seen in the lower extremities, bilaterally. - No evidence of superficial venous thrombosis in the lower extremities, bilaterally. -No evidence of popliteal cyst, bilaterally. RIGHT: - Portions of this examination were limited- see technologist comments above.  LEFT: - Portions of this examination were limited- see technologist comments above.  *See table(s) above for measurements and observations. Electronically signed by Deitra Mayo MD on 03/19/2021  at 12:16:58 PM.    Final      Procedures: enteroscopy   Subjective: Patient is feeling well, no nausea or vomiting, edema and weakness have improved.   Discharge Exam: Vitals:   03/27/21 0501 03/27/21 0733  BP: 126/78 130/77  Pulse: 85 92  Resp: 14 16  Temp: (!) 97.4 F (36.3 C) (!) 97.4 F (36.3 C)  SpO2: 98% 98%   Vitals:   03/26/21 1952 03/26/21 2314 03/27/21 0501 03/27/21 0733  BP: 124/81 134/76 126/78 130/77  Pulse: 93 98 85 92  Resp: _0 Temp: 97.9 F (36.6 C) 98 F (36.7 C) (!) 97.4 F (36.3 C) (!) 97.4 F (36.3 C)  TempSrc: Oral Oral Oral Oral  SpO2: 99% 98% 98% 98%  Weight:   127.7 kg   Height:        General: Not in pain or dyspnea  Neurology: Awake and alert, non focal  E ENT: no pallor, no icterus, oral mucosa moist Cardiovascular: No JVD. S1-S2 present, rhythmic, no gallops, rubs, or murmurs. + non pitting bilateral lower extremity edema. Pulmonary: positive breath sounds bilaterally, adequate air movement, no wheezing, rhonchi or rales. Gastrointestinal. Abdomen soft and non tender Skin. No rashes Musculoskeletal: no joint deformities   The results of significant diagnostics from this hospitalization (including imaging, microbiology, ancillary and laboratory) are listed below for reference.     Microbiology: Recent Results (from the past 240 hour(s))  Resp Panel by RT-PCR (Flu A&B, Covid) Nasopharyngeal Swab     Status: None   Collection Time: 03/24/21  1:45 PM   Specimen: Nasopharyngeal Swab; Nasopharyngeal(NP) swabs in vial transport medium  Result Value Ref Range Status   SARS Coronavirus 2 by RT PCR NEGATIVE NEGATIVE Final    Comment: (NOTE) SARS-CoV-2 target nucleic acids are NOT DETECTED.  The SARS-CoV-2 RNA is generally detectable in upper respiratory specimens during the acute phase of infection. The lowest concentration of SARS-CoV-2 viral copies this assay can detect is 138 copies/mL. A negative result does not preclude  SARS-Cov-2 infection and should not be used as the sole basis for treatment or other patient management decisions. A negative result may occur with  improper specimen collection/handling, submission of specimen other than nasopharyngeal swab, presence of viral mutation(s) within the areas targeted by this assay, and inadequate number of viral copies(<138 copies/mL). A negative result must be combined with clinical observations, patient history, and epidemiological information. The expected result is Negative.  Fact Sheet for Patients:  EntrepreneurPulse.com.au  Fact Sheet for Healthcare Providers:  IncredibleEmployment.be  This test is no t yet approved or cleared by the [place] FDA and  has been authorized for detection and/or diagnosis of SARS-CoV-2 by FDA under an  Emergency Use Authorization (EUA). This EUA will remain  in effect (meaning this test can be used) for the duration of the COVID-19 declaration under Section 564(b)(1) of the Act, 21 U.S.C.section 360bbb-3(b)(1), unless the authorization is terminated  or revoked sooner.       Influenza A by PCR NEGATIVE NEGATIVE Final   Influenza B by PCR NEGATIVE NEGATIVE Final    Comment: (NOTE) The Xpert Xpress SARS-CoV-2/FLU/RSV plus assay is intended as an aid in the diagnosis of influenza from Nasopharyngeal swab specimens and should not be used as a sole basis for treatment. Nasal washings and aspirates are unacceptable for Xpert Xpress SARS-CoV-2/FLU/RSV testing.  Fact Sheet for Patients: EntrepreneurPulse.com.au  Fact Sheet for Healthcare Providers: IncredibleEmployment.be  This test is not yet approved or cleared by the [place] FDA and has been authorized for detection and/or diagnosis of SARS-CoV-2 by FDA under an Emergency Use Authorization (EUA). This EUA will remain in effect (meaning this test can be used) for the duration of  the COVID-19 declaration under Section 564(b)(1) of the Act, 21 U.S.C. section 360bbb-3(b)(1), unless the authorization is terminated or revoked.  Performed at [facility], [place] [address] [place],  [address]      Labs: BNP (last 3 results) Recent Labs    09/09/20 1116 01/05/21 0505 01/06/21 1804  BNP 516.1* 87.9 767.3*   Basic Metabolic Panel: Recent Labs  Lab 03/24/21 1124 03/25/21 0138 03/25/21 0903 03/25/21 2205 03/26/21 0138 03/27/21 0131  NA  --  138 142 138 137 136  K  --  4.0 4.4 4.0 4.0 3.7  CL  --  99 102 98 98 98  CO2  --  _0 GLUCOSE  --  105* 100* 97 87 115*  BUN  --  45* 48* 23 24* 14  CREATININE  --  12.03* 12.65* 7.53* 7.89* 5.96*  CALCIUM  --  8.4* 8.6* 8.3* 8.4* 8.7*  MG 2.2  --  2.4  --  2.0 1.9  PHOS  --   --  4.4 3.1 3.1 3.4   Liver Function Tests: Recent Labs  Lab 03/24/21 0917 03/25/21 0138 03/25/21 0903 03/25/21 2205 03/26/21 0138 03/27/21 0131  AST 51* 57* 57*  --  62* 68*  ALT _1 --  21 22  ALKPHOS 64 62 64  --  67 74  BILITOT 0.5 0.7 0.6  --  0.6 0.6  PROT 6.8 7.2 7.0  --  7.4 7.8  ALBUMIN 2.1* 2.2* 2.1* 2.1* 2.2* 2.4*   No results for input(s): LIPASE, AMYLASE in the last 168 hours. Recent Labs  Lab 03/24/21 1831  AMMONIA 23   CBC: Recent Labs  Lab 03/24/21 0917 03/24/21 1831 03/25/21 0138 03/25/21 0903 03/26/21 0138 03/27/21 0131  WBC 5.2 5.4 5.7 4.9 4.8 4.9  NEUTROABS 2.3  --   --  2.4 2.5 2.6  HGB 6.8* 8.1* 8.1* 7.9* 8.2* 8.6*  HCT 21.4* 26.2* 25.5* 24.7* 25.8* 27.1*  MCV 105.4* 103.6* 101.6* 101.2* 101.2* 102.3*  PLT 103* 107* 108* 106* 101* 96*   Cardiac Enzymes: No results for input(s): CKTOTAL, CKMB, CKMBINDEX, TROPONINI in the last 168 hours. BNP: Invalid input(s): POCBNP CBG: Recent Labs  Lab 03/26/21 1103 03/26/21 1703 03/26/21 2105 03/27/21 0637 03/27/21 1106  GLUCAP 81 88 112* 90 139*   D-Dimer No results for input(s): DDIMER in the last 72 hours. Hgb  A1c No results for input(s): HGBA1C in the last 72 hours. Lipid  Profile No results for input(s): CHOL, HDL, LDLCALC, TRIG, CHOLHDL, LDLDIRECT in the last 72 hours. Thyroid function studies Recent Labs    03/24/21 1831  TSH 3.465   Anemia work up No results for input(s): VITAMINB12, FOLATE, FERRITIN, TIBC, IRON, RETICCTPCT in the last 72 hours. Urinalysis    Component Value Date/Time   COLORURINE YELLOW 07/31/2019 2128   APPEARANCEUR CLEAR 07/31/2019 2128   LABSPEC 1.009 07/31/2019 2128   PHURINE 5.0 07/31/2019 2128   GLUCOSEU NEGATIVE 07/31/2019 2128   HGBUR SMALL (A) 07/31/2019 2128   BILIRUBINUR NEGATIVE 07/31/2019 2128   KETONESUR NEGATIVE 07/31/2019 2128   PROTEINUR 100 (A) 07/31/2019 2128   UROBILINOGEN 0.2 12/31/2009 0746   NITRITE NEGATIVE 07/31/2019 2128   LEUKOCYTESUR NEGATIVE 07/31/2019 2128   Sepsis Labs Invalid input(s): PROCALCITONIN,  WBC,  LACTICIDVEN Microbiology Recent Results (from the past 240 hour(s))  Resp Panel by RT-PCR (Flu A&B, Covid) Nasopharyngeal Swab     Status: None   Collection Time: 03/24/21  1:45 PM   Specimen: Nasopharyngeal Swab; Nasopharyngeal(NP) swabs in vial transport medium  Result Value Ref Range Status   SARS Coronavirus 2 by RT PCR NEGATIVE NEGATIVE Final    Comment: (NOTE) SARS-CoV-2 target nucleic acids are NOT DETECTED.  The SARS-CoV-2 RNA is generally detectable in upper respiratory specimens during the acute phase of infection. The lowest concentration of SARS-CoV-2 viral copies this assay can detect is 138 copies/mL. A negative result does not preclude SARS-Cov-2 infection and should not be used as the sole basis for treatment or other patient management decisions. A negative result may occur with  improper specimen collection/handling, submission of specimen other than nasopharyngeal swab, presence of viral mutation(s) within the areas targeted by this assay, and inadequate number of viral copies(<138 copies/mL). A  negative result must be combined with clinical observations, patient history, and epidemiological information. The expected result is Negative.  Fact Sheet for Patients:  EntrepreneurPulse.com.au  Fact Sheet for Healthcare Providers:  IncredibleEmployment.be  This test is no t yet approved or cleared by the [place] FDA and  has been authorized for detection and/or diagnosis of SARS-CoV-2 by FDA under an Emergency Use Authorization (EUA). This EUA will remain  in effect (meaning this test can be used) for the duration of the COVID-19 declaration under Section 564(b)(1) of the Act, 21 U.S.C.section 360bbb-3(b)(1), unless the authorization is terminated  or revoked sooner.       Influenza A by PCR NEGATIVE NEGATIVE Final   Influenza B by PCR NEGATIVE NEGATIVE Final    Comment: (NOTE) The Xpert Xpress SARS-CoV-2/FLU/RSV plus assay is intended as an aid in the diagnosis of influenza from Nasopharyngeal swab specimens and should not be used as a sole basis for treatment. Nasal washings and aspirates are unacceptable for Xpert Xpress SARS-CoV-2/FLU/RSV testing.  Fact Sheet for Patients: EntrepreneurPulse.com.au  Fact Sheet for Healthcare Providers: IncredibleEmployment.be  This test is not yet approved or cleared by the [place] FDA and has been authorized for detection and/or diagnosis of SARS-CoV-2 by FDA under an Emergency Use Authorization (EUA). This EUA will remain in effect (meaning this test can be used) for the duration of the COVID-19 declaration under Section 564(b)(1) of the Act, 21 U.S.C. section 360bbb-3(b)(1), unless the authorization is terminated or revoked.  Performed at [facility], [place] [address], [place], [place] [address]      Time coordinating discharge: 45 minutes  SIGNED:   Tawni Millers, MD  [facility] 03/27/2021, 11:53 AM

## 2021-03-27 NOTE — Consult Note (Signed)
[facility] Face-to-Face Psychiatry Consult   Reason for Consult:  Visual hallucinations Referring Physician:  Orma Flaming, MD Patient Identification: Daniel [name] MRN:  [id number] Principal Diagnosis: Acute on chronic anemia Diagnosis:  Principal Problem:   Acute on chronic anemia Active Problems:   Essential hypertension   DM2 (diabetes mellitus, type 2) ([place])   Chronic hepatitis C without hepatic coma (HCC)   ESRD on hemodialysis (HCC)   Nicotine dependence, cigarettes, uncomplicated   Upper GI bleed   Thrombocytopenia (HCC)   AVM (arteriovenous malformation) of duodenum, acquired   Peptic ulcer disease   End-stage renal disease on hemodialysis ([place])   Diabetes mellitus type 2, controlled, with complications ([place])   Cocaine abuse ([place])   Nicotine abuse   Morbidly obese ([place])   Gastritis and gastroduodenitis  Assessment  Daniel [name] is a 60 y.o. male admitted medically 03/24/2021  9:06 AM for upper GI bleed.Patient carries no history of psychiatric diagnoses and has a past medical history of ESRD on HD TTS, remote alcohol and cocaine use, known history of peptic ulcer .Psychiatry was consulted for reported visual hallucinations by patient.   On initial examination, patient appears very pleasant and endorses that he has been having what sound like visual hallucinations over the past week since starting a new medication, gabapentin. We plan to recommend patient's gabapentin be discontinued patient continued to be monitored.    Patient overall improved, taking meds as prescribed. Patient denies AVH and jerking movements of the body since discontinuation of Gabapentin. Patient endorsed he has no plans to restart the medication at discharge.   Medication induced psychotic symptoms - Discontinue gabapentin 300 mg twice daily  Safety At this time patient determined to be a low risk of harm to himself or others.  Recommend patient be on routine observation.  Dispo - Per  primary Thank you for this consult we will  sign off.    Total Time spent with patient: 15 minutes  Subjective:   Daniel [name] is a 60 y.o. male patient admitted with upper GI bleed.  HPI:  Patient Aox4 today. Patient endorses that he has been doing well and denies SI,HI, and AVH since his gabapentin was stopped. Patient endorsed that he has been compliant with his medications in the hospital and is feeling good overall. Patient does not endorse any additional concerns.   Past Medical History:  Past Medical History:  Diagnosis Date   Anemia    Diabetes mellitus without complication [facility])    patient denies   Dialysis patient [facility])    End stage chronic kidney disease ([place])    Hypertension    ICH (intracerebral hemorrhage) ([place]) 05/20/2017   Shoulder pain, left 06/28/2013    Past Surgical History:  Procedure Laterality Date   A/V FISTULAGRAM N/A 08/15/2020   Procedure: A/V FISTULAGRAM - Left Upper;  Surgeon: Cherre Robins, MD;  Location: [place] [facility];  Service: Cardiovascular;  Laterality: N/A;   APPENDECTOMY     AV FISTULA PLACEMENT Left 08/03/2019   Procedure: LEFT ARM ARTERIOVENOUS (AV) CEPHALIC  FISTULA CREATION;  Surgeon: Waynetta Sandy, MD;  Location: [place];  Service: Vascular;  Laterality: Left;   BIOPSY  06/30/2019   Procedure: BIOPSY;  Surgeon: Ronald Lobo, MD;  Location: [facility] ENDOSCOPY;  Service: Endoscopy;;   BIOPSY  08/02/2019   Procedure: BIOPSY;  Surgeon: Yetta Flock, MD;  Location: [facility] ENDOSCOPY;  Service: Gastroenterology;;   BIOPSY  02/08/2021   Procedure: BIOPSY;  Surgeon: Dayna Barker  C, MD;  Location: [place];  Service: Gastroenterology;;   BIOPSY  02/24/2021   Procedure: BIOPSY;  Surgeon: [name], MD;  Location: [place];  Service: Gastroenterology;;   COLONOSCOPY  01/23/2012   Procedure: COLONOSCOPY;  Surgeon: Danie Binder, MD;  [facility];  Service: Endoscopy;  Laterality: N/A;  11:10 AM    COLONOSCOPY WITH PROPOFOL N/A 06/30/2019   Procedure: COLONOSCOPY WITH PROPOFOL;  Surgeon: Ronald Lobo, MD;  Location: [place];  Service: Endoscopy;  Laterality: N/A;   ENTEROSCOPY N/A 08/02/2019   Procedure: ENTEROSCOPY;  Surgeon: Yetta Flock, MD;  Location: [facility] ENDOSCOPY;  Service: Gastroenterology;  Laterality: N/A;   ENTEROSCOPY N/A 02/24/2021   Procedure: ENTEROSCOPY;  Surgeon: [name], MD;  Location: [facility] ENDOSCOPY;  Service: Gastroenterology;  Laterality: N/A;   ESOPHAGOGASTRODUODENOSCOPY N/A 08/10/2020   Procedure: ESOPHAGOGASTRODUODENOSCOPY (EGD);  Surgeon: Jerene Bears, MD;  Location: [facility] ENDOSCOPY;  Service: Gastroenterology;  Laterality: N/A;   ESOPHAGOGASTRODUODENOSCOPY (EGD) WITH PROPOFOL N/A 06/30/2019   Procedure: ESOPHAGOGASTRODUODENOSCOPY (EGD) WITH PROPOFOL;  Surgeon: Ronald Lobo, MD;  Location: [place];  Service: Endoscopy;  Laterality: N/A;   ESOPHAGOGASTRODUODENOSCOPY (EGD) WITH PROPOFOL N/A 01/12/2021   Procedure: ESOPHAGOGASTRODUODENOSCOPY (EGD) WITH PROPOFOL;  Surgeon: Sharyn Creamer, MD;  Location: [place];  Service: Gastroenterology;  Laterality: N/A;   ESOPHAGOGASTRODUODENOSCOPY (EGD) WITH PROPOFOL N/A 02/08/2021   Procedure: ESOPHAGOGASTRODUODENOSCOPY (EGD) WITH PROPOFOL;  Surgeon: Sharyn Creamer, MD;  Location: [place];  Service: Gastroenterology;  Laterality: N/A;   FISTULA SUPERFICIALIZATION Left 10/17/2019   Procedure: LEFT UPPER EXTREMITY FISTULA REVISION, SIDE BRANCH LIGATION,  AND SUPERFICIALIZATION;  Surgeon: Marty Heck, MD;  Location: [place];  Service: Vascular;  Laterality: Left;   GIVENS CAPSULE STUDY N/A 06/30/2019   Procedure: GIVENS CAPSULE STUDY;  Surgeon: Ronald Lobo, MD;  Location: [place];  Service: Endoscopy;  Laterality: N/A;   HEMOSTASIS CLIP PLACEMENT  08/10/2020   Procedure: HEMOSTASIS CLIP PLACEMENT;  Surgeon: Jerene Bears, MD;  Location: [place];  Service: Gastroenterology;;    HEMOSTASIS CLIP PLACEMENT  01/12/2021   Procedure: HEMOSTASIS CLIP PLACEMENT;  Surgeon: Sharyn Creamer, MD;  Location: [place];  Service: Gastroenterology;;   HEMOSTASIS CONTROL  08/02/2019   Procedure: HEMOSTASIS CONTROL;  Surgeon: Yetta Flock, MD;  Location: [place];  Service: Gastroenterology;;   HOT HEMOSTASIS  02/24/2021   Procedure: HOT HEMOSTASIS (ARGON PLASMA COAGULATION/BICAP);  Surgeon: [name], MD;  Location: [facility] ENDOSCOPY;  Service: Gastroenterology;;   INCISION AND DRAINAGE ABSCESS N/A 06/29/2016   Procedure: INCISION AND DRAINAGE ABDOMINAL WALL ABSCESS;  Surgeon: Alphonsa Overall, MD;  Location: [facility];  Service: General;  Laterality: N/A;   INSERTION OF DIALYSIS CATHETER Right 08/03/2019   Procedure: INSERTION OF DIALYSIS CATHETER;  Surgeon: Waynetta Sandy, MD;  Location: [place];  Service: Vascular;  Laterality: Right;   INSERTION OF DIALYSIS CATHETER Right 10/22/2019   Procedure: INSERTION OF 23CM TUNNELED DIALYSIS CATHETER RIGHT INTERNAL JUGULAR;  Surgeon: Angelia Mould, MD;  Location: [place];  Service: Vascular;  Laterality: Right;   INSERTION OF DIALYSIS CATHETER Right 08/12/2020   Procedure: INSERTION OF Right internal Jugular TUNNELED  DIALYSIS CATHETER.;  Surgeon: Waynetta Sandy, MD;  Location: [place];  Service: Vascular;  Laterality: Right;   Left heel surgery     PENILE BIOPSY N/A 03/26/2020   Procedure: PENILE ULCER DEBRIDEMENT;  Surgeon: Remi Haggard, MD;  Location: [facility];  Service: Urology;  Laterality: N/A;  30 MINS  SCLEROTHERAPY  01/12/2021   Procedure: SCLEROTHERAPY;  Surgeon: Sharyn Creamer, MD;  Location: [facility] ENDOSCOPY;  Service: Gastroenterology;;   Clide Deutscher  02/24/2021   Procedure: Clide Deutscher;  Surgeon: [name], MD;  Location: [facility] ENDOSCOPY;  Service: Gastroenterology;;   Family History:  Family History  Problem Relation Age of Onset   Colon cancer Neg Hx     Social History:  Social  History   Substance and Sexual Activity  Alcohol Use Not Currently   Alcohol/week: 12.0 standard drinks   Types: 12 Cans of beer per week     Social History   Substance and Sexual Activity  Drug Use Not Currently   Types: "Crack" cocaine   Comment: last in 2020    Social History   Socioeconomic History   Marital status: Single    Spouse name: Not on file   Number of children: Not on file   Years of education: Not on file   Highest education level: Not on file  Occupational History   Not on file  Tobacco Use   Smoking status: Every Day    Packs/day: 1.00    Years: 30.00    Pack years: 30.00    Types: Cigarettes   Smokeless tobacco: Never  Vaping Use   Vaping Use: Never used  Substance and Sexual Activity   Alcohol use: Not Currently    Alcohol/week: 12.0 standard drinks    Types: 12 Cans of beer per week   Drug use: Not Currently    Types: "Crack" cocaine    Comment: last in 2020   Sexual activity: Yes    Birth control/protection: None  Other Topics Concern   Not on file  Social History Narrative   Not on file   Social Determinants of Health   Financial Resource Strain: Not on file  Food Insecurity: Not on file  Transportation Needs: Not on file  Physical Activity: Not on file  Stress: Not on file  Social Connections: Not on file   Additional Social History:    Allergies:   Allergies  Allergen Reactions   Dilaudid [Hydromorphone Hcl] Itching and Other (See Comments)    Pt reports itchiness after IM injection     Labs:  Results for orders placed or performed during the hospital encounter of 03/24/21 (from the past 48 hour(s))  Glucose, capillary     Status: Abnormal   Collection Time: 03/25/21  5:00 PM  Result Value Ref Range   Glucose-Capillary 136 (H) 70 - 99 mg/dL    Comment: Glucose reference range applies only to samples taken after fasting for at least 8 hours.   Comment 1 Notify RN    Comment 2 Document in Chart   Glucose, capillary      Status: Abnormal   Collection Time: 03/25/21  9:08 PM  Result Value Ref Range   Glucose-Capillary 110 (H) 70 - 99 mg/dL    Comment: Glucose reference range applies only to samples taken after fasting for at least 8 hours.  Renal function panel     Status: Abnormal   Collection Time: 03/25/21 10:05 PM  Result Value Ref Range   Sodium 138 135 - 145 mmol/L   Potassium 4.0 3.5 - 5.1 mmol/L   Chloride 98 98 - 111 mmol/L   CO2 27 22 - 32 mmol/L   Glucose, Bld 97 70 - 99 mg/dL    Comment: Glucose reference range applies only to samples taken after fasting for at least 8 hours.   BUN  23 8 - 23 mg/dL   Creatinine, Ser 7.53 (H) 0.61 - 1.24 mg/dL    Comment: DIALYSIS   Calcium 8.3 (L) 8.9 - 10.3 mg/dL   Phosphorus 3.1 2.5 - 4.6 mg/dL   Albumin 2.1 (L) 3.5 - 5.0 g/dL   GFR, Estimated 8 (L) >60 mL/min    Comment: (NOTE) Calculated using the CKD-EPI Creatinine Equation (2021)    Anion gap 13 5 - 15    Comment: Performed at [facility] [address], [place], [place] [address]  Comprehensive metabolic panel     Status: Abnormal   Collection Time: 03/26/21  1:38 AM  Result Value Ref Range   Sodium 137 135 - 145 mmol/L   Potassium 4.0 3.5 - 5.1 mmol/L   Chloride 98 98 - 111 mmol/L   CO2 28 22 - 32 mmol/L   Glucose, Bld 87 70 - 99 mg/dL    Comment: Glucose reference range applies only to samples taken after fasting for at least 8 hours.   BUN 24 (H) 8 - 23 mg/dL   Creatinine, Ser 7.89 (H) 0.61 - 1.24 mg/dL   Calcium 8.4 (L) 8.9 - 10.3 mg/dL   Total Protein 7.4 6.5 - 8.1 g/dL   Albumin 2.2 (L) 3.5 - 5.0 g/dL   AST 62 (H) 15 - 41 U/L   ALT 21 0 - 44 U/L   Alkaline Phosphatase 67 38 - 126 U/L   Total Bilirubin 0.6 0.3 - 1.2 mg/dL   GFR, Estimated 7 (L) >60 mL/min    Comment: (NOTE) Calculated using the CKD-EPI Creatinine Equation (2021)    Anion gap 11 5 - 15    Comment: Performed at [facility], [place] [address] [place], [place] [address]  Magnesium     Status: None    Collection Time: 03/26/21  1:38 AM  Result Value Ref Range   Magnesium 2.0 1.7 - 2.4 mg/dL    Comment: Performed at [facility] [address], [place], [place] [address]  Phosphorus     Status: None   Collection Time: 03/26/21  1:38 AM  Result Value Ref Range   Phosphorus 3.1 2.5 - 4.6 mg/dL    Comment: Performed at [facility] [name] [place], [place] [address]  CBC with Differential/Platelet     Status: Abnormal   Collection Time: 03/26/21  1:38 AM  Result Value Ref Range   WBC 4.8 4.0 - 10.5 K/uL   RBC 2.55 (L) 4.22 - 5.81 MIL/uL   Hemoglobin 8.2 (L) 13.0 - 17.0 g/dL   HCT 25.8 (L) 39.0 - 52.0 %   MCV 101.2 (H) 80.0 - 100.0 fL   MCH 32.2 26.0 - 34.0 pg   MCHC 31.8 30.0 - 36.0 g/dL   RDW 20.2 (H) 11.5 - 15.5 %   Platelets 101 (L) 150 - 400 K/uL    Comment: Immature Platelet Fraction may be clinically indicated, consider ordering this additional test [id number] CONSISTENT WITH PREVIOUS RESULT REPEATED TO VERIFY    nRBC 0.8 (H) 0.0 - 0.2 %   Neutrophils Relative % 53 %   Neutro Abs 2.5 1.7 - 7.7 K/uL   Lymphocytes Relative 34 %   Lymphs Abs 1.6 0.7 - 4.0 K/uL   Monocytes Relative 10 %   Monocytes Absolute 0.5 0.1 - 1.0 K/uL   Eosinophils Relative 2 %   Eosinophils Absolute 0.1 0.0 - 0.5 K/uL   Basophils Relative 0 %   Basophils  Absolute 0.0 0.0 - 0.1 K/uL   Immature Granulocytes 1 %   Abs Immature Granulocytes 0.03 0.00 - 0.07 K/uL    Comment: Performed at [facility], [place] [address] [place] [address] [place], [place] [address]  Hepatitis B surface antibody,qualitative     Status: Abnormal   Collection Time: 03/26/21  1:38 AM  Result Value Ref Range   Hep B S Ab Reactive (A) NON REACTIVE    Comment: (NOTE) Consistent with immunity, greater than 9.9 mIU/mL.  Performed at [facility], [place] [address] [place],  [address]   Hepatitis B surface antibody,quantitative     Status: None   Collection Time: 03/26/21  1:38 AM  Result Value Ref  Range   Hepatitis B-Post 178.6 Immunity>9.9 mIU/mL    Comment: (NOTE)  Status of Immunity                     Anti-HBs Level  ------------------                     -------------- Inconsistent with Immunity                   0.0 - 9.9 Consistent with Immunity                          >9.9 Performed At: [facility] [place], [place] [id number] Rush Farmer MD [id number]   Hepatitis B surface antigen     Status: None   Collection Time: 03/26/21  1:38 AM  Result Value Ref Range   Hepatitis B Surface Ag NON REACTIVE NON REACTIVE    Comment: Performed at [facility] [address], [place], [place] [address]  Glucose, capillary     Status: None   Collection Time: 03/26/21  6:17 AM  Result Value Ref Range   Glucose-Capillary 79 70 - 99 mg/dL    Comment: Glucose reference range applies only to samples taken after fasting for at least 8 hours.  Glucose, capillary     Status: None   Collection Time: 03/26/21  8:59 AM  Result Value Ref Range   Glucose-Capillary 75 70 - 99 mg/dL    Comment: Glucose reference range applies only to samples taken after fasting for at least 8 hours.  Glucose, capillary     Status: None   Collection Time: 03/26/21 10:10 AM  Result Value Ref Range   Glucose-Capillary 83 70 - 99 mg/dL    Comment: Glucose reference range applies only to samples taken after fasting for at least 8 hours.  Glucose, capillary     Status: None   Collection Time: 03/26/21 11:03 AM  Result Value Ref Range   Glucose-Capillary 81 70 - 99 mg/dL    Comment: Glucose reference range applies only to samples taken after fasting for at least 8 hours.  Glucose, capillary     Status: None   Collection Time: 03/26/21  5:03 PM  Result Value Ref Range   Glucose-Capillary 88 70 - 99 mg/dL    Comment: Glucose reference range applies only to samples taken after fasting for at least 8 hours.  Glucose, capillary     Status: Abnormal   Collection Time: 03/26/21  9:05  PM  Result Value Ref Range   Glucose-Capillary 112 (H) 70 - 99 mg/dL    Comment: Glucose reference range applies only to samples taken after fasting for at least 8 hours.  Comprehensive metabolic panel     Status: Abnormal   Collection Time: 03/27/21  1:31 AM  Result Value Ref Range   Sodium 136 135 - 145 mmol/L   Potassium 3.7 3.5 - 5.1 mmol/L   Chloride 98 98 - 111 mmol/L   CO2 26 22 - 32 mmol/L   Glucose, Bld 115 (H) 70 - 99 mg/dL    Comment: Glucose reference range applies only to samples taken after fasting for at least 8 hours.   BUN 14 8 - 23 mg/dL   Creatinine, Ser 5.96 (H) 0.61 - 1.24 mg/dL   Calcium 8.7 (L) 8.9 - 10.3 mg/dL   Total Protein 7.8 6.5 - 8.1 g/dL   Albumin 2.4 (L) 3.5 - 5.0 g/dL   AST 68 (H) 15 - 41 U/L   ALT 22 0 - 44 U/L   Alkaline Phosphatase 74 38 - 126 U/L   Total Bilirubin 0.6 0.3 - 1.2 mg/dL   GFR, Estimated 10 (L) >60 mL/min    Comment: (NOTE) Calculated using the CKD-EPI Creatinine Equation (2021)    Anion gap 12 5 - 15    Comment: Performed at [facility], [place] [address], [place], [place] [address]  Magnesium     Status: None   Collection Time: 03/27/21  1:31 AM  Result Value Ref Range   Magnesium 1.9 1.7 - 2.4 mg/dL    Comment: Performed at [facility] [address], [place], [place] [address]  Phosphorus     Status: None   Collection Time: 03/27/21  1:31 AM  Result Value Ref Range   Phosphorus 3.4 2.5 - 4.6 mg/dL    Comment: Performed at [facility] [address], [place], [place] [address]  CBC with Differential/Platelet     Status: Abnormal   Collection Time: 03/27/21  1:31 AM  Result Value Ref Range   WBC 4.9 4.0 - 10.5 K/uL   RBC 2.65 (L) 4.22 - 5.81 MIL/uL   Hemoglobin 8.6 (L) 13.0 - 17.0 g/dL   HCT 27.1 (L) 39.0 - 52.0 %   MCV 102.3 (H) 80.0 - 100.0 fL   MCH 32.5 26.0 - 34.0 pg   MCHC 31.7 30.0 - 36.0 g/dL   RDW 19.6 (H) 11.5 - 15.5 %   Platelets 96 (L) 150 - 400 K/uL    Comment: Immature Platelet  Fraction may be clinically indicated, consider ordering this additional test [id number] CONSISTENT WITH PREVIOUS RESULT REPEATED TO VERIFY    nRBC 0.6 (H) 0.0 - 0.2 %   Neutrophils Relative % 52 %   Neutro Abs 2.6 1.7 - 7.7 K/uL   Lymphocytes Relative 32 %   Lymphs Abs 1.6 0.7 - 4.0 K/uL   Monocytes Relative 13 %   Monocytes Absolute 0.6 0.1 - 1.0 K/uL   Eosinophils Relative 2 %   Eosinophils Absolute 0.1 0.0 - 0.5 K/uL   Basophils Relative 0 %   Basophils Absolute 0.0 0.0 - 0.1 K/uL   Immature Granulocytes 1 %   Abs Immature Granulocytes 0.03 0.00 - 0.07 K/uL    Comment: Performed at [facility], [place] [address], [place], [place] [address]  Glucose, capillary     Status: None   Collection Time: 03/27/21  6:37 AM  Result Value Ref Range   Glucose-Capillary 90 70 - 99 mg/dL    Comment: Glucose reference range applies only to samples taken after fasting for at least 8 hours.  Glucose, capillary  Status: Abnormal   Collection Time: 03/27/21 11:06 AM  Result Value Ref Range   Glucose-Capillary 139 (H) 70 - 99 mg/dL    Comment: Glucose reference range applies only to samples taken after fasting for at least 8 hours.    Current Facility-Administered Medications  Medication Dose Route Frequency Provider Last Rate Last Admin   0.9 %  sodium chloride infusion  250 mL Intravenous PRN Cirigliano, Vito V, DO       acetaminophen (TYLENOL) tablet 650 mg  650 mg Oral Q6H PRN Cirigliano, Vito V, DO   650 mg at 03/26/21 0640   Or   acetaminophen (TYLENOL) suppository 650 mg  650 mg Rectal Q6H PRN Cirigliano, Vito V, DO       Chlorhexidine Gluconate Cloth 2 % PADS 6 each  6 each Topical Q0600 Cirigliano, Vito V, DO       cyclobenzaprine (FLEXERIL) tablet 5 mg  5 mg Oral TID PRN Cirigliano, Vito V, DO   5 mg at 03/27/21 0935   diphenhydrAMINE-zinc acetate (BENADRYL) 2-0.1 % cream   Topical Daily PRN Cirigliano, Vito V, DO   1 application at 02/54/27 0825   doxercalciferol (HECTOROL)  injection 3 mcg  3 mcg Intravenous Q T,Th,Sa-HD Cirigliano, Vito V, DO   3 mcg at 03/26/21 1606   ferric gluconate (FERRLECIT) 125 mg in sodium chloride 0.9 % 100 mL IVPB  125 mg Intravenous Q T,Th,Sa-HD Cirigliano, Vito V, DO   Stopped at 10/19/74 2831   folic acid (FOLVITE) tablet 1 mg  1 mg Oral Daily Cirigliano, Vito V, DO   1 mg at 03/27/21 0933   heparin injection 5,000 Units  5,000 Units Subcutaneous Q12H Cirigliano, Vito V, DO   5,000 Units at 03/27/21 0936   hydrALAZINE (APRESOLINE) injection 10 mg  10 mg Intravenous Q8H PRN Cirigliano, Vito V, DO       HYDROcodone-acetaminophen (NORCO/VICODIN) 5-325 MG per tablet 1 tablet  1 tablet Oral Q4H PRN Cirigliano, Vito V, DO   1 tablet at 03/26/21 2236   insulin aspart (novoLOG) injection 0-6 Units  0-6 Units Subcutaneous TID WC Cirigliano, Vito V, DO       multivitamin (RENA-VIT) tablet 1 tablet  1 tablet Oral QHS Skeet Simmer, RPH       nicotine (NICODERM CQ - dosed in mg/24 hours) patch 21 mg  21 mg Transdermal Daily Cirigliano, Vito V, DO   21 mg at 03/26/21 0823   pantoprazole (PROTONIX) EC tablet 40 mg  40 mg Oral BID Cirigliano, Vito V, DO   40 mg at 03/27/21 0934   QUEtiapine (SEROQUEL) tablet 50 mg  50 mg Oral QHS Allie Bossier, MD   50 mg at 03/26/21 2235   sodium chloride flush (NS) 0.9 % injection 3 mL  3 mL Intravenous Q12H Cirigliano, Vito V, DO   3 mL at 03/27/21 0937   sodium chloride flush (NS) 0.9 % injection 3 mL  3 mL Intravenous PRN Cirigliano, Vito V, DO       sodium thiosulfate 25 g in sodium chloride 0.9 % 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 90 tablet 0  blood glucose meter kit and supplies KIT Dispense based on patient  and insurance preference. Use up to four times daily as directed. (FOR ICD-9 250.00, 250.01). 1 each 0   calcium acetate (PHOSLO) 667 MG capsule Take 1,334 mg by mouth 3 (three) times daily with meals.     cloNIDine (CATAPRES) 0.1 MG tablet Take 0.1 mg by mouth as directed. TAKE 1 TABLET BID BUT ON DIALYSIS DAYS TAKE ONLY AT BEDTIME.     HYDROcodone-acetaminophen (NORCO/VICODIN) 5-325 MG tablet Take 1 tablet by mouth every 4 (four) hours as needed. 10 tablet 0   hydrOXYzine (ATARAX/VISTARIL) 25 MG tablet TAKE 1 TABLET BY MOUTH EVERY 8 HOURS AS NEEDED FOR ANXIETY OR  ITCHING (Patient taking differently: Take 25 mg by mouth in the morning and at bedtime.) 90 tablet 0   lidocaine-prilocaine (EMLA) cream Apply 1 application topically once.     pantoprazole (PROTONIX) 40 MG tablet Take 1 tablet (40 mg total) by mouth 2 (two) times daily. 60 tablet 2   QUEtiapine (SEROQUEL) 50 MG tablet Take 1 tablet (50 mg total) by mouth at bedtime. 30 tablet 0  ic Specialty Exam:  Presentation  General Appearance: Appropriate for Environment  Eye Contact:Good  Speech:Clear and Coherent  Speech Volume:Normal  Handedness:No data recorded  Mood and Affect  Mood:Euthymic  Affect:Appropriate   Thought Process  Thought Processes:Coherent  Descriptions of Associations:Intact  Orientation:Full (Time, Place and Person)  Thought Content:Logical  History of Schizophrenia/Schizoaffective disorder:No data recorded Duration of Psychotic Symptoms:No data recorded Hallucinations:Hallucinations: None  Ideas of Reference:None  Suicidal Thoughts:Suicidal Thoughts: No  Homicidal Thoughts:Homicidal Thoughts: No   Sensorium  Memory:Immediate Good; Recent Good  Judgment:Fair  Insight:Fair   Executive Functions  Concentration:Good  Attention Span:Good  Recall:No data recorded Fund of Knowledge:Good  Language:Good   Psychomotor Activity  Psychomotor Activity:Psychomotor Activity:  Normal   Assets  Assets:Communication Skills; Desire for Improvement; Resilience   Sleep  Sleep:Sleep: Good   Physical Exam: Physical Exam Constitutional:      Appearance: Normal appearance.  HENT:     Head: Normocephalic and atraumatic.  Pulmonary:     Effort: Pulmonary effort is normal.  Skin:    General: Skin is dry.  Neurological:     Mental Status: He is alert and oriented to person, place, and time.   Review of Systems  Psychiatric/Behavioral:  Negative for hallucinations and suicidal ideas.   Blood pressure 130/77, pulse 92, temperature (!) 97.4 F (36.3 C), temperature source Oral, resp. rate 16, height 5' 11"  (1.803 m), weight 127.7 kg, SpO2 98 %. Body mass index is 39.27 kg/m.   PGY-2 Freida Busman, MD 03/27/2021 3:11 PM

## 2021-03-27 NOTE — Progress Notes (Signed)
Nursing DC note  Patient alert and oriented, verbalized understanding of dc instructions. Dc paperwork and belongings given to patient. This RN is going to take the patient down to the main entrance of [facility].

## 2021-03-27 NOTE — Progress Notes (Signed)
[facility] ROUNDING NOTE   Subjective: No acute events overnight.  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  No overt bleeding overnight.  H/H stable at 8.6/27.1.  Objective: Vital signs in last 24 hours: Temp:  [97 F (36.1 C)-98 F (36.7 C)] 97.4 F (36.3 C) (11/30 0733) Pulse Rate:  [80-98] 92 (11/30 0733) Resp:  [12-20] 16 (11/30 0733) BP: (71-156)/(40-105) 130/77 (11/30 0733) SpO2:  [92 %-100 %] 98 % (11/30 0733) Weight:  [126.8 kg-129.3 kg] 127.7 kg (11/30 0501) Last BM Date: 03/26/21 General: NAD Abdomen:  Soft, NT, ND    Intake/Output from previous day: 11/29 0701 - 11/30 0700 In: 566 [P.O.:356; I.V.:100; IV Piggyback:110] Out: 4368  Intake/Output this shift: No intake/output data recorded.   Lab Results: Recent Labs    03/25/21 0903 03/26/21 0138 03/27/21 0131  WBC 4.9 4.8 4.9  HGB 7.9* 8.2* 8.6*  PLT 106* 101* 96*  MCV 101.2* 101.2* 102.3*   BMET Recent Labs    03/25/21 2205 03/26/21 0138 03/27/21 0131  NA 138 137 136  K 4.0 4.0 3.7  CL 98 98 98  CO2 27 28 26   GLUCOSE 97 87 115*  BUN 23 24* 14  CREATININE 7.53* 7.89* 5.96*  CALCIUM 8.3* 8.4* 8.7*   LFT Recent Labs    03/25/21 0903 03/25/21 2205 03/26/21 0138 03/27/21 0131  PROT 7.0  --  7.4 7.8  ALBUMIN 2.1* 2.1* 2.2* 2.4*  AST 57*  --  62* 68*  ALT 20  --  21 22  ALKPHOS 64  --  67 74  BILITOT 0.6  --  0.6 0.6   PT/INR Recent Labs    03/24/21 1831  INR 1.0      Imaging/Other results: VAS [place] LOWER EXTREMITY VENOUS (DVT)  Result Date: 03/25/2021  Lower Venous DVT Study Patient Name:  Daniel [name]  Date of Exam:   03/25/2021 Medical Rec #: [id number]           Accession #:    [id number] Date of Birth: 1961/01/06           Patient Gender: M Patient Age:   60 years Exam Location:  [facility] Procedure:      VAS [place] LOWER EXTREMITY VENOUS (DVT) Referring  Phys: Vicente Serene WOODS --------------------------------------------------------------------------------  Indications: Pain.  Comparison Study: prior 03/19/21 Performing Technologist: Archie Patten RVS  Examination Guidelines: A complete evaluation includes B-mode imaging, spectral Doppler, color Doppler, and power Doppler as needed of all accessible portions of each vessel. Bilateral testing is considered an integral part of a complete examination. Limited examinations for reoccurring indications may be performed as noted. The reflux portion of the exam is performed with the patient in reverse Trendelenburg.  +---------+---------------+---------+-----------+----------+--------------+ RIGHT    CompressibilityPhasicitySpontaneityPropertiesThrombus Aging +---------+---------------+---------+-----------+----------+--------------+ CFV      Full           Yes      Yes                                 +---------+---------------+---------+-----------+----------+--------------+ SFJ      Full                                                        +---------+---------------+---------+-----------+----------+--------------+  FV Prox  Full                                                        +---------+---------------+---------+-----------+----------+--------------+ FV Mid   Full                                                        +---------+---------------+---------+-----------+----------+--------------+ FV DistalFull                                                        +---------+---------------+---------+-----------+----------+--------------+ PFV      Full                                                        +---------+---------------+---------+-----------+----------+--------------+ POP      Full           Yes      Yes                                 +---------+---------------+---------+-----------+----------+--------------+ PTV      Full                                                         +---------+---------------+---------+-----------+----------+--------------+ PERO     Full                                                        +---------+---------------+---------+-----------+----------+--------------+   +---------+---------------+---------+-----------+----------+--------------+ LEFT     CompressibilityPhasicitySpontaneityPropertiesThrombus Aging +---------+---------------+---------+-----------+----------+--------------+ CFV      Full           Yes      Yes                                 +---------+---------------+---------+-----------+----------+--------------+ SFJ      Full                                                        +---------+---------------+---------+-----------+----------+--------------+ FV Prox  Full                                                        +---------+---------------+---------+-----------+----------+--------------+  FV Mid   Full                                                        +---------+---------------+---------+-----------+----------+--------------+ FV DistalFull                                                        +---------+---------------+---------+-----------+----------+--------------+ PFV      Full                                                        +---------+---------------+---------+-----------+----------+--------------+ POP      Full           Yes      Yes                                 +---------+---------------+---------+-----------+----------+--------------+ PTV      Full                                                        +---------+---------------+---------+-----------+----------+--------------+ PERO     Full                                                        +---------+---------------+---------+-----------+----------+--------------+     Summary: BILATERAL: - No evidence of deep vein thrombosis seen in the lower  extremities, bilaterally. -No evidence of popliteal cyst, bilaterally.   *See table(s) above for measurements and observations. Electronically signed by Monica Martinez MD on 03/25/2021 at 4:44:01 PM.    Final       Assessment and Plan:  1) Small bowel AVMs 2) Gastritis/Duodenitis 3) Acute on chronic anemia - H/H stable and no further overt GI blood loss - Continue Protonix 40 mg bid x6 weeks, then reduce to daily - If recurrent bleed, neck step would be video capsule endoscopy to evaluate for additional distal small bowel AVMs which would either be amenable to device assisted enteroscopy or long-acting octreotide - Continue serial H/H checks as outpatient with dialysis - Okay to discharge from a GI standpoint.  GI service will sign off at this time    Lavena Bullion, DO  03/27/2021, 10:07 AM [facility] Pager [phone]

## 2021-03-28 ENCOUNTER — Encounter: Payer: Self-pay | Admitting: Gastroenterology

## 2021-03-28 ENCOUNTER — Telehealth: Payer: Self-pay

## 2021-03-28 ENCOUNTER — Encounter (HOSPITAL_COMMUNITY): Payer: Self-pay | Admitting: Gastroenterology

## 2021-03-28 NOTE — Telephone Encounter (Signed)
Transition Care Management Unsuccessful Follow-up Telephone Call  Date of discharge and from where:  03/27/2021, [facility]   Attempts:  1st Attempt  Reason for unsuccessful TCM follow-up call:  Unable to reach patient - # [phone], the phone rings then goes to fast busy signal.  Need to discuss scheduling a hospital follow up appointment with [name], NP @ [facility]

## 2021-03-29 ENCOUNTER — Telehealth: Payer: Self-pay

## 2021-03-29 NOTE — Telephone Encounter (Signed)
Transition Care Management Unsuccessful Follow-up Telephone Call   Date of discharge and from where:  03/27/2021, [facility]    Attempts:  2nd Attempt   Reason for unsuccessful TCM follow-up call:  Unable to reach patient - # [phone], the phone rings then goes to fast busy signal.   Need to discuss scheduling a hospital follow up appointment with [name], NP @ [facility]

## 2021-03-30 ENCOUNTER — Emergency Department (HOSPITAL_COMMUNITY)
Admission: EM | Admit: 2021-03-30 | Discharge: 2021-03-30 | Disposition: A | Discharge status: Home / Self Care | Payer: Medicare Other | Attending: Student | Admitting: Student

## 2021-03-30 ENCOUNTER — Other Ambulatory Visit: Payer: Self-pay

## 2021-03-30 ENCOUNTER — Emergency Department (HOSPITAL_COMMUNITY): Payer: Medicare Other

## 2021-03-30 ENCOUNTER — Encounter (HOSPITAL_COMMUNITY): Payer: Self-pay | Admitting: Emergency Medicine

## 2021-03-30 DIAGNOSIS — I70245 Atherosclerosis of native arteries of left leg with ulceration of other part of foot: Secondary | ICD-10-CM

## 2021-03-30 DIAGNOSIS — I120 Hypertensive chronic kidney disease with stage 5 chronic kidney disease or end stage renal disease: Secondary | ICD-10-CM | POA: Insufficient documentation

## 2021-03-30 DIAGNOSIS — Z85068 Personal history of other malignant neoplasm of small intestine: Secondary | ICD-10-CM | POA: Diagnosis not present

## 2021-03-30 DIAGNOSIS — N186 End stage renal disease: Secondary | ICD-10-CM

## 2021-03-30 DIAGNOSIS — Z7982 Long term (current) use of aspirin: Secondary | ICD-10-CM

## 2021-03-30 DIAGNOSIS — Z79899 Other long term (current) drug therapy: Secondary | ICD-10-CM | POA: Insufficient documentation

## 2021-03-30 DIAGNOSIS — D631 Anemia in chronic kidney disease: Secondary | ICD-10-CM | POA: Insufficient documentation

## 2021-03-30 DIAGNOSIS — Z8549 Personal history of malignant neoplasm of other male genital organs: Secondary | ICD-10-CM | POA: Insufficient documentation

## 2021-03-30 DIAGNOSIS — Z992 Dependence on renal dialysis: Secondary | ICD-10-CM | POA: Insufficient documentation

## 2021-03-30 DIAGNOSIS — F17210 Nicotine dependence, cigarettes, uncomplicated: Secondary | ICD-10-CM | POA: Insufficient documentation

## 2021-03-30 DIAGNOSIS — E1122 Type 2 diabetes mellitus with diabetic chronic kidney disease: Secondary | ICD-10-CM | POA: Diagnosis not present

## 2021-03-30 DIAGNOSIS — I739 Peripheral vascular disease, unspecified: Secondary | ICD-10-CM

## 2021-03-30 DIAGNOSIS — M79671 Pain in right foot: Secondary | ICD-10-CM | POA: Diagnosis present

## 2021-03-30 LAB — CBC WITH DIFFERENTIAL/PLATELET
Abs Immature Granulocytes: 0.03 10*3/uL (ref 0.00–0.07)
Basophils Absolute: 0 10*3/uL (ref 0.0–0.1)
Basophils Relative: 1 %
Eosinophils Absolute: 0.1 10*3/uL (ref 0.0–0.5)
Eosinophils Relative: 2 %
HCT: 29 % — ABNORMAL LOW (ref 39.0–52.0)
Hemoglobin: 9.2 g/dL — ABNORMAL LOW (ref 13.0–17.0)
Immature Granulocytes: 1 %
Lymphocytes Relative: 31 %
Lymphs Abs: 2 10*3/uL (ref 0.7–4.0)
MCH: 33.5 pg (ref 26.0–34.0)
MCHC: 31.7 g/dL (ref 30.0–36.0)
MCV: 105.5 fL — ABNORMAL HIGH (ref 80.0–100.0)
Monocytes Absolute: 0.6 10*3/uL (ref 0.1–1.0)
Monocytes Relative: 10 %
Neutro Abs: 3.6 10*3/uL (ref 1.7–7.7)
Neutrophils Relative %: 55 %
Platelets: 146 10*3/uL — ABNORMAL LOW (ref 150–400)
RBC: 2.75 MIL/uL — ABNORMAL LOW (ref 4.22–5.81)
RDW: 20.1 % — ABNORMAL HIGH (ref 11.5–15.5)
WBC: 6.3 10*3/uL (ref 4.0–10.5)
nRBC: 0.9 % — ABNORMAL HIGH (ref 0.0–0.2)

## 2021-03-30 LAB — BASIC METABOLIC PANEL
Anion gap: 15 (ref 5–15)
BUN: 9 mg/dL (ref 8–23)
CO2: 30 mmol/L (ref 22–32)
Calcium: 8.5 mg/dL — ABNORMAL LOW (ref 8.9–10.3)
Chloride: 96 mmol/L — ABNORMAL LOW (ref 98–111)
Creatinine, Ser: 5.23 mg/dL — ABNORMAL HIGH (ref 0.61–1.24)
GFR, Estimated: 12 mL/min — ABNORMAL LOW (ref >60)
Glucose, Bld: 155 mg/dL — ABNORMAL HIGH (ref 70–99)
Potassium: 3.4 mmol/L — ABNORMAL LOW (ref 3.5–5.1)
Sodium: 141 mmol/L (ref 135–145)

## 2021-03-30 LAB — LACTIC ACID, PLASMA: Lactic Acid, Venous: 2.1 mmol/L (ref 0.5–1.9)

## 2021-03-30 NOTE — ED Provider Notes (Signed)
Emergency Medicine Provider Triage Evaluation Note  Daniel [name] , [name] 60 y.o. male  was evaluated in triage.  Pt complains of bilateral foot pain x3 weeks.  He denies any injury or trauma.  He notes that at baseline he has diminished feeling to his bilateral feet.  He has diabetes and compliant with his medications he goes to dialysis Tuesday Thursday Saturday and went today.  He denies swelling, fever, chills, color change, wound.  Review of Systems  Positive: Bilateral foot pain Negative: Swelling, fever, chills  Physical Exam  BP 115/62 (BP Location: Right Arm)   Pulse 95   Temp 97.6 F (36.4 C) (Oral)   Resp 18   SpO2 99%  Gen:   Awake, no distress   Resp:  Normal effort  MSK:   Moves extremities without difficulty  Other:  Mild tenderness to palpation to the plantar surface of bilateral feet.  No color change or ulcers noted.  Medical Decision Making  Medically screening exam initiated at 11:33 AM.  Appropriate orders placed.  Daniel [name] was informed that the remainder of the evaluation will be completed by another provider, this initial triage assessment does not replace that evaluation, and the importance of remaining in the ED until their evaluation is complete.    Daniel Doucet A, PA-C 03/30/21 1135    Carmin Muskrat, MD 03/30/21 1423

## 2021-03-30 NOTE — Discharge Instructions (Addendum)
Follow up for blood flow study, this will be scheduled for you.  Call Dr. Nicole Cella office if you have not been contacted by Tuesday to have this scheduled. Return to the ER for worsening or concerning symptoms.

## 2021-03-30 NOTE — Consult Note (Signed)
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  Although he has biphasic posterior tibial signals bilaterally I think he does have evidence of infrainguinal arterial occlusive disease bilaterally.  Given that there is a small wound at the base of the left fourth and fifth toe I have recommended that we proceed with arteriography.  We will focus on the left leg.  He dialyzes on Tuesdays Thursdays and Saturdays so we will schedule this on a Monday Wednesday or Friday.    I have reviewed with the patient the indications for arteriography. In addition, I have reviewed the potential complications of arteriography including but not limited to: Bleeding, arterial injury, arterial thrombosis, dye action, renal insufficiency, or other unpredictable medical problems. I have explained to the patient that if we find disease amenable to angioplasty we could potentially address this at the same time. I have discussed the potential complications of angioplasty and stenting, including but not limited to: Bleeding, arterial thrombosis, arterial injury, dissection, or the need for surgical intervention.  Recommend the following which can slow the progression of atherosclerosis and reduce the risk of major adverse cardiac / limb events:  Aspirin 30m PO QD.  Atorvastatin 40-80mg PO QD (or other "high intensity" statin therapy). Complete cessation from all tobacco products. Blood glucose control with goal A1c < 7%. Blood pressure control with goal blood pressure < 140/90 mmHg. Lipid reduction therapy with goal LDL-C <100 mg/dL (<70 if symptomatic from PAD).   END-STAGE RENAL DISEASE: His left upper arm fistula has an excellent thrill and appears to be working well.  BILATERAL FOOT PAIN: I think his pain in his feet is most likely related to neuropathy.  I explained to him that narcotics would not be  helpful for this pain.  REASON FOR CONSULT:    Bilateral foot pain for 3 weeks.  The consult is requested by Dr. KMatilde Sprang   HPI:   Daniel MERRYFIELDis a 60y.o. [facility] a [facility] bilateral foot pain for 3 weeks.  He has end-stage renal disease and dialyzes on Tuesdays Thursdays and Saturdays.  He was seen in our office on 09/18/2020 for follow-up after left cephalic arch angioplasty and stenting.  At that time he was complaining of bilateral foot pain with a burning sensation.  He did not have any wounds on his feet.  He did not have any claudication or rest pain.  He had ABIs performed at that visit however the arteries were incompressible and ABIs could not be obtained.  However he had a triphasic posterior tibial signal bilaterally at the ankle and reasonable toe pressures.  Past Medical History:  Diagnosis Date   Anemia    Diabetes mellitus without complication ([facility]    patient denies   Dialysis patient ([facility]    End stage chronic kidney disease ([facility]    Hypertension    ICH (intracerebral hemorrhage) ([facility] 05/20/2017   Shoulder pain, left 06/28/2013    Family History  Problem Relation Age of Onset   Colon cancer Neg Hx     SOCIAL HISTORY: Social History   Tobacco Use   Smoking status: Every Day    Packs/day: 1.00    Years: 30.00    Pack years: 30.00    Types: Cigarettes   Smokeless tobacco: Never  Substance Use Topics   Alcohol use: Not Currently  Alcohol/week: 12.0 standard drinks    Types: 12 Cans of beer per week    Allergies  Allergen Reactions   Dilaudid [Hydromorphone Hcl] Itching and Other (See Comments)    Pt reports itchiness after IM injection     No current facility-administered medications for this encounter.   Current Outpatient Medications  Medication Sig Dispense Refill   amLODipine (NORVASC) 10 MG tablet Take 1 tablet (10 mg total) by mouth at bedtime. 90 tablet 0   blood glucose  meter kit and supplies KIT Dispense based on patient and insurance preference. Use up to four times daily as directed. (FOR ICD-9 250.00, 250.01). 1 each 0   calcium acetate (PHOSLO) 667 MG capsule Take 1,334 mg by mouth 3 (three) times daily with meals.     cloNIDine (CATAPRES) 0.1 MG tablet Take 0.1 mg by mouth as directed. TAKE 1 TABLET BID BUT ON DIALYSIS DAYS TAKE ONLY AT BEDTIME.     HYDROcodone-acetaminophen (NORCO/VICODIN) 5-325 MG tablet Take 1 tablet by mouth every 4 (four) hours as needed. 10 tablet 0   hydrOXYzine (ATARAX/VISTARIL) 25 MG tablet TAKE 1 TABLET BY MOUTH EVERY 8 HOURS AS NEEDED FOR ANXIETY OR  ITCHING (Patient taking differently: Take 25 mg by mouth in the morning and at bedtime.) 90 tablet 0   lidocaine-prilocaine (EMLA) cream Apply 1 application topically once.     pantoprazole (PROTONIX) 40 MG tablet Take 1 tablet (40 mg total) by mouth 2 (two) times daily. 60 tablet 2   QUEtiapine (SEROQUEL) 50 MG tablet Take 1 tablet (50 mg total) by mouth at bedtime. 30 tablet 0    REVIEW OF SYSTEMS:  _0  denotes positive finding, _1  denotes negative finding Cardiac  Comments:  Chest pain or chest pressure:    Shortness of breath upon exertion: x   Short of breath when lying flat:    Irregular heart rhythm:        Vascular    Pain in calf, thigh, or hip brought on by ambulation: x   Pain in feet at night that wakes you up from your sleep:     Blood clot in your veins:    Leg swelling:         Pulmonary    Oxygen at home:    Productive cough:     Wheezing:         Neurologic    Sudden weakness in arms or legs:     Sudden numbness in arms or legs:     Sudden onset of difficulty speaking or slurred speech:    Temporary loss of vision in one eye:     Problems with dizziness:         Gastrointestinal    Blood in stool:     Vomited blood:         Genitourinary    Burning when urinating:     Blood in urine:        Psychiatric    Major depression:          Hematologic    Bleeding problems:    Problems with blood clotting too easily:        Skin    Rashes or ulcers:        Constitutional    Fever or chills:    -  PHYSICAL EXAM:   Vitals:   03/30/21 1106  BP: 115/62  Pulse: 95  Resp: 18  Temp: 97.6 F (36.4 C)  TempSrc: Oral  SpO2: 99%  There is no height or weight on file to calculate BMI. GENERAL: The patient is a well-nourished male, in no acute distress. The vital signs are documented above. CARDIAC: There is a regular rate and rhythm.  VASCULAR: I do not detect carotid bruits. On the right side he has a palpable femoral pulse and weakly palpable popliteal pulse.  I cannot palpate pedal pulses.  He has a biphasic posterior tibial signal with a Doppler and a biphasic dorsalis pedis signal on the right. On the left side he has a palpable femoral pulse.  I cannot palpate a popliteal pulse or pedal pulses.  He has a biphasic posterior tibial signal with the Doppler.  He has a monophasic dorsalis pedis signal and a biphasic anterior tibial signal and peroneal signal on the left. He has a good thrill in his left upper arm fistula. PULMONARY: There is good air exchange bilaterally without wheezing or rales. ABDOMEN: Soft and non-tender with normal pitched bowel sounds.  Because of the size it is difficult to assess for an aneurysm. MUSCULOSKELETAL: There are no major deformities. NEUROLOGIC: No focal weakness or paresthesias are detected. SKIN: He has a small wound at the base of his left fourth and fifth toes as documented in the photographs below.     PSYCHIATRIC: The patient has a normal affect.  DATA:    ARTERIAL DOPPLER STUDY: I have reviewed the arterial Doppler study that was done on 09/18/2020.  On the right side there was a triphasic posterior tibial signal and dorsalis pedis signal.  The arteries were not compressible and therefore an ABI could not be obtained.  However toe pressure was 87 mmHg.  On the left side  he had a triphasic dorsalis pedis and posterior tibial signal.  Again the arteries were calcified and not compressible.  An ABI could not be obtained.  Toe pressure was 88 mmHg.  VENOUS DUPLEX: I reviewed the venous duplex scan that was done on 03/25/2021.  This showed no evidence of DVT in either lower extremity.  Deitra Mayo [facility]

## 2021-03-30 NOTE — ED Notes (Signed)
Patient transported to X-ray 

## 2021-03-30 NOTE — ED Triage Notes (Addendum)
Pt reports pain and swelling to bilateral feet x 3 days.  No known injury.  Dialysis completed today.

## 2021-03-30 NOTE — ED Provider Notes (Signed)
[facility] EMERGENCY DEPARTMENT Provider Note   CSN: [id number] Arrival date & time: 03/30/21  1053     History Chief Complaint  Patient presents with   Foot Pain    Daniel [name] is a 60 y.o. male.  60 year old male presents with complaint of pain in bilateral feet x3 weeks.  Patient is a dialysis patient, also diabetic.  Does not see podiatry.  Denies any falls or injuries to his feet, states they hurt worse during dialysis and with walking.  States he has neuropathy however his feet have been hurting worse for the past 3 weeks. Patient was recently admitted to the hospital, discharged on March 27, 2021 for anemia.      Past Medical History:  Diagnosis Date   Anemia    Diabetes mellitus without complication [facility])    patient denies   Dialysis patient [facility])    End stage chronic kidney disease ([place])    Hypertension    ICH (intracerebral hemorrhage) ([place]) 05/20/2017   Shoulder pain, left 06/28/2013    Patient Active Problem List   Diagnosis Date Noted   Gastritis and gastroduodenitis    End-stage renal disease on hemodialysis ([place]) 03/25/2021   Diabetes mellitus type 2, controlled, with complications ([place]) 25/08/3974   QT prolongation 03/25/2021   Alcohol abuse with intoxication ([place]) 03/25/2021   Cocaine abuse ([place]) 03/25/2021   Nicotine abuse 03/25/2021   Morbidly obese ([place]) 03/25/2021   AVM (arteriovenous malformation) of duodenum, acquired    Peptic ulcer disease    Upper GI bleed 03/24/2021   Chalazion of right upper eyelid 03/24/2021   Thrombocytopenia ([place]) 03/24/2021   Visual hallucination 03/24/2021   Bilateral leg edema 03/24/2021   Acute blood loss anemia    Benign neoplasm of duodenum, jejunum, and ileum    Anemia due to chronic kidney disease 02/23/2021   Prolonged QT interval 02/23/2021   Rotator cuff tear arthropathy of right shoulder 01/23/2021   Alcohol dependence ([place]) 01/13/2021   Gastric ulcer with hemorrhage  01/13/2021   Generalized weakness    Transaminitis 01/10/2021   History of alcohol abuse 10/27/2020   History of cocaine abuse ([place]) 10/27/2020   Nicotine dependence, cigarettes, uncomplicated 73/41/9379   Acute GI bleeding 08/10/2020   Gastrointestinal hemorrhage with melena    Duodenal ulcer with hemorrhage    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   ESRD on hemodialysis ([place])    AVM (arteriovenous malformation) of small bowel, acquired with hemorrhage    Symptomatic anemia 07/31/2019   Acute on chronic anemia 07/23/2019   Chronic hepatitis C without hepatic coma ([place]) 07/11/2019   Iron deficiency anemia 03/30/2019   Alcohol use disorder 03/30/2019   B12 deficiency 03/30/2019   Orthostatic hypotension 01/22/2019   Essential hypertension 06/29/2016   DM2 (diabetes mellitus, type 2) ([place]) 06/29/2016    Past Surgical History:  Procedure Laterality Date   A/V FISTULAGRAM N/A 08/15/2020   Procedure: A/V FISTULAGRAM - Left Upper;  Surgeon: Cherre Robins, MD;  Location: [place] [facility];  Service: Cardiovascular;  Laterality: N/A;   APPENDECTOMY     AV FISTULA PLACEMENT Left 08/03/2019   Procedure: LEFT ARM ARTERIOVENOUS (AV) CEPHALIC  FISTULA CREATION;  Surgeon: Waynetta Sandy, MD;  Location: [place];  Service: Vascular;  Laterality: Left;   BIOPSY  06/30/2019   Procedure: BIOPSY;  Surgeon: Ronald Lobo, MD;  Location: [facility] ENDOSCOPY;  Service: Endoscopy;;   BIOPSY  08/02/2019   Procedure: BIOPSY;  Surgeon: Cornland Cellar  P, MD;  Location: [place];  Service: Gastroenterology;;   BIOPSY  02/08/2021   Procedure: BIOPSY;  Surgeon: Sharyn Creamer, MD;  Location: [facility] ENDOSCOPY;  Service: Gastroenterology;;   BIOPSY  02/24/2021   Procedure: BIOPSY;  Surgeon: [name], MD;  Location: [facility] ENDOSCOPY;  Service: Gastroenterology;;   BIOPSY  03/26/2021   Procedure: BIOPSY;  Surgeon: Lavena Bullion, DO;  Location: [place];  Service:  Gastroenterology;;   COLONOSCOPY  01/23/2012   Procedure: COLONOSCOPY;  Surgeon: Danie Binder, MD;  [facility];  Service: Endoscopy;  Laterality: N/A;  11:10 AM   COLONOSCOPY WITH PROPOFOL N/A 06/30/2019   Procedure: COLONOSCOPY WITH PROPOFOL;  Surgeon: Ronald Lobo, MD;  Location: [place];  Service: Endoscopy;  Laterality: N/A;   ENTEROSCOPY N/A 08/02/2019   Procedure: ENTEROSCOPY;  Surgeon: Yetta Flock, MD;  Location: [facility] ENDOSCOPY;  Service: Gastroenterology;  Laterality: N/A;   ENTEROSCOPY N/A 02/24/2021   Procedure: ENTEROSCOPY;  Surgeon: [name], MD;  Location: [facility] ENDOSCOPY;  Service: Gastroenterology;  Laterality: N/A;   ENTEROSCOPY N/A 03/26/2021   Procedure: ENTEROSCOPY;  Surgeon: Lavena Bullion, DO;  Location: [place];  Service: Gastroenterology;  Laterality: N/A;   ESOPHAGOGASTRODUODENOSCOPY N/A 08/10/2020   Procedure: ESOPHAGOGASTRODUODENOSCOPY (EGD);  Surgeon: Jerene Bears, MD;  Location: [facility] ENDOSCOPY;  Service: Gastroenterology;  Laterality: N/A;   ESOPHAGOGASTRODUODENOSCOPY (EGD) WITH PROPOFOL N/A 06/30/2019   Procedure: ESOPHAGOGASTRODUODENOSCOPY (EGD) WITH PROPOFOL;  Surgeon: Ronald Lobo, MD;  Location: [place];  Service: Endoscopy;  Laterality: N/A;   ESOPHAGOGASTRODUODENOSCOPY (EGD) WITH PROPOFOL N/A 01/12/2021   Procedure: ESOPHAGOGASTRODUODENOSCOPY (EGD) WITH PROPOFOL;  Surgeon: Sharyn Creamer, MD;  Location: [place];  Service: Gastroenterology;  Laterality: N/A;   ESOPHAGOGASTRODUODENOSCOPY (EGD) WITH PROPOFOL N/A 02/08/2021   Procedure: ESOPHAGOGASTRODUODENOSCOPY (EGD) WITH PROPOFOL;  Surgeon: Sharyn Creamer, MD;  Location: [place];  Service: Gastroenterology;  Laterality: N/A;   FISTULA SUPERFICIALIZATION Left 10/17/2019   Procedure: LEFT UPPER EXTREMITY FISTULA REVISION, SIDE BRANCH LIGATION,  AND SUPERFICIALIZATION;  Surgeon: Marty Heck, MD;  Location: [place];  Service: Vascular;  Laterality: Left;    GIVENS CAPSULE STUDY N/A 06/30/2019   Procedure: GIVENS CAPSULE STUDY;  Surgeon: Ronald Lobo, MD;  Location: [place];  Service: Endoscopy;  Laterality: N/A;   HEMOSTASIS CLIP PLACEMENT  08/10/2020   Procedure: HEMOSTASIS CLIP PLACEMENT;  Surgeon: Jerene Bears, MD;  Location: [place];  Service: Gastroenterology;;   HEMOSTASIS CLIP PLACEMENT  01/12/2021   Procedure: HEMOSTASIS CLIP PLACEMENT;  Surgeon: Sharyn Creamer, MD;  Location: [place];  Service: Gastroenterology;;   HEMOSTASIS CONTROL  08/02/2019   Procedure: HEMOSTASIS CONTROL;  Surgeon: Yetta Flock, MD;  Location: [place];  Service: Gastroenterology;;   HOT HEMOSTASIS  02/24/2021   Procedure: HOT HEMOSTASIS (ARGON PLASMA COAGULATION/BICAP);  Surgeon: [name], MD;  Location: [facility] ENDOSCOPY;  Service: Gastroenterology;;   HOT HEMOSTASIS N/A 03/26/2021   Procedure: HOT HEMOSTASIS (ARGON PLASMA COAGULATION/BICAP);  Surgeon: Lavena Bullion, DO;  Location: [facility] ENDOSCOPY;  Service: Gastroenterology;  Laterality: N/A;   INCISION AND DRAINAGE ABSCESS N/A 06/29/2016   Procedure: INCISION AND DRAINAGE ABDOMINAL WALL ABSCESS;  Surgeon: Alphonsa Overall, MD;  Location: [facility];  Service: General;  Laterality: N/A;   INSERTION OF DIALYSIS CATHETER Right 08/03/2019   Procedure: INSERTION OF DIALYSIS CATHETER;  Surgeon: Waynetta Sandy, MD;  Location: [place];  Service: Vascular;  Laterality: Right;   INSERTION OF DIALYSIS CATHETER Right 10/22/2019   Procedure: INSERTION OF  23CM TUNNELED DIALYSIS CATHETER RIGHT INTERNAL JUGULAR;  Surgeon: Angelia Mould, MD;  Location: [place];  Service: Vascular;  Laterality: Right;   INSERTION OF DIALYSIS CATHETER Right 08/12/2020   Procedure: INSERTION OF Right internal Jugular TUNNELED  DIALYSIS CATHETER.;  Surgeon: Waynetta Sandy, MD;  Location: [place];  Service: Vascular;  Laterality: Right;   Left heel surgery     PENILE BIOPSY N/A 03/26/2020   Procedure:  PENILE ULCER DEBRIDEMENT;  Surgeon: Remi Haggard, MD;  Location: [facility];  Service: Urology;  Laterality: N/A;  30 MINS   SCLEROTHERAPY  01/12/2021   Procedure: SCLEROTHERAPY;  Surgeon: Sharyn Creamer, MD;  Location: [facility] ENDOSCOPY;  Service: Gastroenterology;;   Clide Deutscher  02/24/2021   Procedure: Clide Deutscher;  Surgeon: [name], MD;  Location: [facility] ENDOSCOPY;  Service: Gastroenterology;;       Family History  Problem Relation Age of Onset   Colon cancer Neg Hx     Social History   Tobacco Use   Smoking status: Every Day    Packs/day: 1.00    Years: 30.00    Pack years: 30.00    Types: Cigarettes   Smokeless tobacco: Never  Vaping Use   Vaping Use: Never used  Substance Use Topics   Alcohol use: Not Currently    Alcohol/week: 12.0 standard drinks    Types: 12 Cans of beer per week   Drug use: Not Currently    Types: "Crack" cocaine    Comment: last in 2020    Home Medications Prior to Admission medications   Medication Sig Start Date End Date Taking? Authorizing Provider  amLODipine (NORVASC) 10 MG tablet Take 1 tablet (10 mg total) by mouth at bedtime. 03/23/21   [name], NP  blood glucose meter kit and supplies KIT Dispense based on patient and insurance preference. Use up to four times daily as directed. (FOR ICD-9 250.00, 250.01). 07/01/16   Nita Sells, MD  calcium acetate (PHOSLO) 667 MG capsule Take 1,334 mg by mouth 3 (three) times daily with meals. 03/14/21   [provider]  cloNIDine (CATAPRES) 0.1 MG tablet Take 0.1 mg by mouth as directed. TAKE 1 TABLET BID BUT ON DIALYSIS DAYS TAKE ONLY AT BEDTIME. 03/13/21   [provider]  HYDROcodone-acetaminophen (NORCO/VICODIN) 5-325 MG tablet Take 1 tablet by mouth every 4 (four) hours as needed. 03/19/21   Isla Pence, MD  hydrOXYzine (ATARAX/VISTARIL) 25 MG tablet TAKE 1 TABLET BY MOUTH EVERY 8 HOURS AS NEEDED FOR ANXIETY OR  ITCHING Patient taking differently:  Take 25 mg by mouth in the morning and at bedtime. 11/07/20   [name], NP  lidocaine-prilocaine (EMLA) cream Apply 1 application topically once. 12/27/20   [provider]  pantoprazole (PROTONIX) 40 MG tablet Take 1 tablet (40 mg total) by mouth 2 (two) times daily. 02/28/21   Charlynne Cousins, MD  QUEtiapine (SEROQUEL) 50 MG tablet Take 1 tablet (50 mg total) by mouth at bedtime. 03/27/21 04/26/21  Arrien, Jimmy Picket, MD  colchicine 0.6 MG tablet Take 0.5 tablets (0.3 mg total) by mouth 2 (two) times daily. 07/24/20 07/29/20  Noemi Chapel, MD  ferrous sulfate 325 (65 FE) MG tablet SMARTSIG:1 Tablet(s) By Mouth Twice Daily 08/11/19 02/09/20  [provider]  furosemide (LASIX) 40 MG tablet Take 1 tablet (40 mg total) by mouth daily. 07/25/19 02/09/20  Ladona Horns, MD    Allergies    Dilaudid [hydromorphone hcl]  Review of Systems   Review  of Systems  Constitutional:  Negative for fever.  Respiratory:  Negative for shortness of breath.   Cardiovascular:  Negative for chest pain.  Gastrointestinal:  Negative for abdominal pain.  Musculoskeletal:  Positive for arthralgias and joint swelling.  Skin:  Positive for color change and wound.  Allergic/Immunologic: Positive for immunocompromised state.  Neurological:  Negative for weakness and numbness.  Hematological:  Negative for adenopathy.  All other systems reviewed and are negative.  Physical Exam Updated Vital Signs BP 108/83 (BP Location: Right Arm)   Pulse 95   Temp 98.1 F (36.7 C) (Oral)   Resp 20   SpO2 99%   Physical Exam Vitals and nursing note reviewed.  Constitutional:      General: He is not in acute distress.    Appearance: He is well-developed. He is not diaphoretic.  HENT:     Head: Normocephalic and atraumatic.  Cardiovascular:     Pulses: Normal pulses.     Comments: DP pulses present bilaterally with Doppler Pulmonary:     Effort: Pulmonary effort is normal.   Musculoskeletal:        General: Swelling and tenderness present.     Right lower leg: No edema.     Left lower leg: No edema.     Comments: Chronic changes to bilateral feet, does appear to have a old wound to the dorsum of the left foot near the fifth MTP.  Left foot more swollen than right, both feet are tender to palpation.  He does report sensation to each toe.  Left foot appears darker than right as pictured. Pulses present with bedside Doppler.  Skin:    General: Skin is warm and dry.  Neurological:     Mental Status: He is alert and oriented to person, place, and time.     Motor: No weakness.  Psychiatric:        Behavior: Behavior normal.       ED Results / Procedures / Treatments   Labs (all labs ordered are listed, but only abnormal results are displayed) Labs Reviewed  CBC WITH DIFFERENTIAL/PLATELET - Abnormal; Notable for the following components:      Result Value   RBC 2.75 (*)    Hemoglobin 9.2 (*)    HCT 29.0 (*)    MCV 105.5 (*)    RDW 20.1 (*)    Platelets 146 (*)    nRBC 0.9 (*)    All other components within normal limits  BASIC METABOLIC PANEL - Abnormal; Notable for the following components:   Potassium 3.4 (*)    Chloride 96 (*)    Glucose, Bld 155 (*)    Creatinine, Ser 5.23 (*)    Calcium 8.5 (*)    GFR, Estimated 12 (*)    All other components within normal limits  LACTIC ACID, PLASMA - Abnormal; Notable for the following components:   Lactic Acid, Venous 2.1 (*)    All other components within normal limits    EKG None  Radiology DG Foot Complete Left  Result Date: 03/30/2021 CLINICAL DATA:  Pain, diabetic wound EXAM: LEFT FOOT - COMPLETE 3+ VIEW COMPARISON:  None. FINDINGS: Plate fixation of the calcaneus. Extensive vascular arterial calcifications. No osseous erosionto suggest osteomyelitis. No soft tissue swelling or subcutaneous gas. IMPRESSION: No radiographic evidence of osteomyelitis. Electronically Signed   By: Suzy Bouchard  M.D.   On: 03/30/2021 13:14   DG Foot Complete Right  Result Date: 03/30/2021 CLINICAL DATA:  Pain, diabetic, wound. EXAM: RIGHT  FOOT COMPLETE - 3+ VIEW COMPARISON:  None. FINDINGS: Extensive vascular calcifications throughout the ankle and foot. Large plantar calcaneal spur. Sclerotic structure in the distal tibia could represent a bone infarct. Negative for fracture or dislocation. No evidence for bone destruction or periosteal reaction. Wound site is not known. IMPRESSION: 1. No acute bone abnormality to the right foot. 2. Calcaneal spur. 3. 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 Electronically Signed   By: Markus Daft M.D.   On: 03/30/2021 13:15    Procedures Procedures   Medications Ordered in ED Medications - No data to display  ED Course  I have reviewed the triage vital signs and the nursing notes.  Pertinent labs & imaging results that were available during my care of the patient were reviewed by me and considered in my medical decision making (see chart for details).  Clinical Course as of 03/30/21 1504  Sat Mar 30, 5556  2548 60 year old male with history of diabetes, on dialysis with recent hospital admission, found to have abnormal CT angio during his hospital stay as follows:  1. Short-segment stenosis in the mid right external iliac artery of possible hemodynamic significance. 2. Tandem stenoses in bilateral superficial femoral arteries of possible hemodynamic significance. 3. Heavy arterial calcification in bilateral popliteal arteries and tibial runoff, with limited contrast opacification limiting assessment of patency. MRA runoff may provide more accurate assessment of these segments.  Presents emergency room with complaint of bilateral foot pain, worse when he is getting dialysis, worse with bearing weight. Patient is found to have skin changes particularly to the left foot.  He reports having sensation in each of his toes.  Bedside Doppler is able to find feet  pulses in both feet, left worse than right.  He does appear to have an old wound to the dorsum of the left foot near the fifth MTP.  Concern for vascular compromise particularly to this left foot given his worsening claudication, external/skin changes with faintly dopplerable pulses. Case discussed with Dr. De Burrs, ER attending, agrees with plan to consult vascular surgery.  Case discussed with Dr. Scot Dock who will come and see the patient.  Concern for patient ability to follow-up outpatient, since leaving the hospital, social worker has been unable to contact patient and he has not made follow up arrangements. He was seen in vascular clinic on 08/2020 and has a fistula, however is unaware he has a vascular doctor.  [LM]  1359 Lactic acid pending.  CBC and BMP without significant changes from chronic baseline changes.  X-ray of the right foot shows possible bone infarct of the distal tibia.  X-ray of the left foot without evidence of osteomyelitis. [LM]  4356 Patient was seen by vascular surgery who will arrange for additional testing outpatient, patient may be discharged.  Discussed importance of follow-up with patient who verbalizes understanding.  Also discussed with patient importance of answering his phone as offices have been trying to arrange care for him, he admits that he was without his phone for a day and realizes the importance of answering calls this week.  If he has not heard from the vascular office, he will call to verify his appointment information for Friday. [LM]    Clinical Course User Index [LM] Roque Lias   MDM Rules/Calculators/A&P                           Final Clinical Impression(s) / ED Diagnoses Final diagnoses:  Peripheral  artery disease [facility])    Rx / DC Orders ED Discharge Orders     None        Roque Lias 03/30/21 1504    Kommor, Debe Coder, MD 03/30/21 1640

## 2021-04-01 ENCOUNTER — Telehealth: Payer: Self-pay

## 2021-04-01 NOTE — Telephone Encounter (Signed)
Transition Care Management Unsuccessful Follow-up Telephone Call  Date of discharge and from where:  03/27/2021, [facility].  Patient was seen again in ED 03/30/2021.   Attempts:  3rd Attempt  Reason for unsuccessful TCM follow-up call:  Left voice message on # [phone] back requested to this CM  It is noted that mail was returned from patient's address on file 03/26/2021. Patient has contact number for PCP- Ms Oletta Lamas, NP on his AVS and he can call to schedule a follow up appointment.

## 2021-04-04 ENCOUNTER — Other Ambulatory Visit: Payer: Self-pay

## 2021-04-04 ENCOUNTER — Encounter: Payer: Self-pay | Admitting: Vascular Surgery

## 2021-04-04 ENCOUNTER — Ambulatory Visit (INDEPENDENT_AMBULATORY_CARE_PROVIDER_SITE_OTHER): Payer: Medicare Other | Admitting: Vascular Surgery

## 2021-04-04 VITALS — BP 134/80 | HR 85 | Temp 98.7°F | Resp 20 | Ht 71.0 in | Wt 296.0 lb

## 2021-04-04 DIAGNOSIS — N186 End stage renal disease: Secondary | ICD-10-CM | POA: Diagnosis not present

## 2021-04-04 DIAGNOSIS — I739 Peripheral vascular disease, unspecified: Secondary | ICD-10-CM | POA: Diagnosis not present

## 2021-04-04 NOTE — Progress Notes (Signed)
  REASON FOR VISIT:   To discuss arteriogram  MEDICAL ISSUES:   PERIPHERAL ARTERIAL DISEASE: Based on his exam I think the patient does have evidence of infrainguinal arterial occlusive disease bilaterally.  Although he has biphasic posterior tibial signals bilaterally I think he does have evidence of infrainguinal arterial occlusive disease bilaterally.  Given that there is a small wound at the base of the left fourth and fifth toe I have recommended that we proceed with arteriography.  We will focus on the left leg.  He dialyzes on Tuesdays Thursdays and Saturdays.  He would like to have this done as soon as possible so I have scheduled this with Dr. Cain on 04/08/2021.   I have reviewed with the patient the indications for arteriography. In addition, I have reviewed the potential complications of arteriography including but not limited to: Bleeding, arterial injury, arterial thrombosis, dye action, renal insufficiency, or other unpredictable medical problems. I have explained to the patient that if we find disease amenable to angioplasty we could potentially address this at the same time. I have discussed the potential complications of angioplasty and stenting, including but not limited to: Bleeding, arterial thrombosis, arterial injury, dissection, or the need for surgical intervention.   Recommend the following which can slow the progression of atherosclerosis and reduce the risk of major adverse cardiac / limb events:  Aspirin 81mg PO QD.  Atorvastatin 40-80mg PO QD (or other "high intensity" statin therapy). Complete cessation from all tobacco products. Blood glucose control with goal A1c < 7%. Blood pressure control with goal blood pressure < 140/90 mmHg. Lipid reduction therapy with goal LDL-C <100 mg/dL (<70 if symptomatic from PAD).    END-STAGE RENAL DISEASE: His left upper arm fistula has an excellent thrill and appears to be working well.   BILATERAL FOOT PAIN: I think his pain  in his feet is most likely related to neuropathy.  I explained to him that narcotics would not be helpful for this pain.   HPI:   Daniel [name] is a pleasant 60 y.o. male who I saw as a consult in the emergency department on 03/30/2021 with foot pain.  He had been having foot pain on both sides for 3 weeks.  He has end-stage renal disease and dialyzes on Tuesdays Thursdays and Saturdays.  Of note he was seen in our office on 09/18/2020 for follow-up after left cephalic arch angioplasty and stenting.  He was complaining of bilateral foot pain and burning at that time.  He did not have any wounds on his feet.  ABIs could not be obtained as his arteries were not compressible.  However, he had triphasic posterior tibial signals bilaterally and reasonable toe pressures.  On my exam he had palpable femoral pulses.  I could not palpate pedal pulses.  On the left side he had a small wound at the base of his fourth and fifth toes as documented on the photograph from my note.  He was to be set up for an arteriogram and comes into the office today.  Continues to have pain in both feet.  The wound on the left foot has not changed significantly.  He would like to have the arteriogram done soon as possible.  Dialyzes on Tuesdays Thursdays and Saturdays.   Past Medical History:  Diagnosis Date   Anemia    Diabetes mellitus without complication (HCC)    patient denies   Dialysis patient (HCC)    End stage chronic kidney disease (HCC)      Hypertension    ICH (intracerebral hemorrhage) (HCC) 05/20/2017   Shoulder pain, left 06/28/2013    Family History  Problem Relation Age of Onset   Colon cancer Neg Hx     SOCIAL HISTORY: Social History   Tobacco Use   Smoking status: Every Day    Packs/day: 0.50    Years: 30.00    Pack years: 15.00    Types: Cigarettes   Smokeless tobacco: Never  Substance Use Topics   Alcohol use: Not Currently    Alcohol/week: 12.0 standard drinks    Types: 12 Cans of  beer per week    Allergies  Allergen Reactions   Dilaudid [Hydromorphone Hcl] Itching and Other (See Comments)    Pt reports itchiness after IM injection     Current Outpatient Medications  Medication Sig Dispense Refill   amLODipine (NORVASC) 10 MG tablet Take 1 tablet (10 mg total) by mouth at bedtime. 90 tablet 0   blood glucose meter kit and supplies KIT Dispense based on patient and insurance preference. Use up to four times daily as directed. (FOR ICD-9 250.00, 250.01). 1 each 0   calcium acetate (PHOSLO) 667 MG capsule Take 1,334 mg by mouth 3 (three) times daily with meals.     cloNIDine (CATAPRES) 0.1 MG tablet Take 0.1 mg by mouth as directed. TAKE 1 TABLET BID BUT ON DIALYSIS DAYS TAKE ONLY AT BEDTIME.     HYDROcodone-acetaminophen (NORCO/VICODIN) 5-325 MG tablet Take 1 tablet by mouth every 4 (four) hours as needed. 10 tablet 0   hydrOXYzine (ATARAX/VISTARIL) 25 MG tablet TAKE 1 TABLET BY MOUTH EVERY 8 HOURS AS NEEDED FOR ANXIETY OR  ITCHING (Patient taking differently: Take 25 mg by mouth in the morning and at bedtime.) 90 tablet 0   lidocaine-prilocaine (EMLA) cream Apply 1 application topically once.     pantoprazole (PROTONIX) 40 MG tablet Take 1 tablet (40 mg total) by mouth 2 (two) times daily. 60 tablet 2   QUEtiapine (SEROQUEL) 50 MG tablet Take 1 tablet (50 mg total) by mouth at bedtime. 30 tablet 0   No current facility-administered medications for this visit.    REVIEW OF SYSTEMS: [X] denotes positive finding, [ ] denotes negative finding Cardiac  Comments:  Chest pain or chest pressure:    Shortness of breath upon exertion: x   Short of breath when lying flat:    Irregular heart rhythm:        Vascular    Pain in calf, thigh, or hip brought on by ambulation: x   Pain in feet at night that wakes you up from your sleep:     Blood clot in your veins:    Leg swelling:         Pulmonary    Oxygen at home:    Productive cough:     Wheezing:          Neurologic    Sudden weakness in arms or legs:     Sudden numbness in arms or legs:     Sudden onset of difficulty speaking or slurred speech:    Temporary loss of vision in one eye:     Problems with dizziness:         Gastrointestinal    Blood in stool:     Vomited blood:         Genitourinary    Burning when urinating:     Blood in urine:        Psychiatric    Major   depression:         Hematologic    Bleeding problems:    Problems with blood clotting too easily:        Skin    Rashes or ulcers:        Constitutional    Fever or chills:     PHYSICAL EXAM:   Vitals:   04/04/21 0820  BP: 134/80  Pulse: 85  Resp: 20  Temp: 98.7 F (37.1 C)  SpO2: 96%  Weight: 296 lb (134.3 kg)  Height: 5' 11" (1.803 m)    GENERAL: The patient is a well-nourished male, in no acute distress. The vital signs are documented above. CARDIAC: There is a regular rate and rhythm.  VASCULAR:  I do not detect carotid bruits. On the right side he has a palpable femoral pulse and weakly palpable popliteal pulse.  I cannot palpate pedal pulses.  He has a biphasic posterior tibial signal with a Doppler and a biphasic dorsalis pedis signal on the right. On the left side he has a palpable femoral pulse.  I cannot palpate a popliteal pulse or pedal pulses.  He has a biphasic posterior tibial signal with the Doppler.  He has a monophasic dorsalis pedis signal and a biphasic anterior tibial signal and peroneal signal on the left. He has a good thrill in his left upper arm fistula. PULMONARY: There is good air exchange bilaterally without wheezing or rales. ABDOMEN: Soft and non-tender with normal pitched bowel sounds.  MUSCULOSKELETAL: There are no major deformities or cyanosis. NEUROLOGIC: No focal weakness or paresthesias are detected. SKIN: He has a wound at the base of his left third and fourth toes.  This is not changed.     PSYCHIATRIC: The patient has a normal affect.  DATA:     ARTERIAL DOPPLER STUDY: I have reviewed the arterial Doppler study that was done on 09/18/2020.   On the right side there was a triphasic posterior tibial signal and dorsalis pedis signal.  The arteries were not compressible and therefore an ABI could not be obtained.  However toe pressure was 87 mmHg.   On the left side he had a triphasic dorsalis pedis and posterior tibial signal.  Again the arteries were calcified and not compressible.  An ABI could not be obtained.  Toe pressure was 88 mmHg.   VENOUS DUPLEX: I reviewed the venous duplex scan that was done on 03/25/2021.  This showed no evidence of DVT in either lower extremity.  Yunior Jain [facility] [place] Office [phone]  

## 2021-04-04 NOTE — H&P (View-Only) (Signed)
REASON FOR VISIT:   To discuss arteriogram  MEDICAL ISSUES:   PERIPHERAL ARTERIAL DISEASE: Based on his exam I think the patient does have evidence of infrainguinal arterial occlusive disease bilaterally.  Although he has biphasic posterior tibial signals bilaterally I think he does have evidence of infrainguinal arterial occlusive disease bilaterally.  Given that there is a small wound at the base of the left fourth and fifth toe I have recommended that we proceed with arteriography.  We will focus on the left leg.  He dialyzes on Tuesdays Thursdays and Saturdays.  He would like to have this done as soon as possible so I have scheduled this with Dr. Donzetta Matters on 04/08/2021.   I have reviewed with the patient the indications for arteriography. In addition, I have reviewed the potential complications of arteriography including but not limited to: Bleeding, arterial injury, arterial thrombosis, dye action, renal insufficiency, or other unpredictable medical problems. I have explained to the patient that if we find disease amenable to angioplasty we could potentially address this at the same time. I have discussed the potential complications of angioplasty and stenting, including but not limited to: Bleeding, arterial thrombosis, arterial injury, dissection, or the need for surgical intervention.   Recommend the following which can slow the progression of atherosclerosis and reduce the risk of major adverse cardiac / limb events:  Aspirin 38m PO QD.  Atorvastatin 40-80mg PO QD (or other "high intensity" statin therapy). Complete cessation from all tobacco products. Blood glucose control with goal A1c < 7%. Blood pressure control with goal blood pressure < 140/90 mmHg. Lipid reduction therapy with goal LDL-C <100 mg/dL (<70 if symptomatic from PAD).    END-STAGE RENAL DISEASE: His left upper arm fistula has an excellent thrill and appears to be working well.   BILATERAL FOOT PAIN: I think his pain  in his feet is most likely related to neuropathy.  I explained to him that narcotics would not be helpful for this pain.   HPI:   RNICHOLI GHUMANis a pleasant 60y.o. male who I saw as a consult in the emergency department on 03/30/2021 with foot pain.  He had been having foot pain on both sides for 3 weeks.  He has end-stage renal disease and dialyzes on Tuesdays Thursdays and Saturdays.  Of note he was seen in our office on 09/18/2020 for follow-up after left cephalic arch angioplasty and stenting.  He was complaining of bilateral foot pain and burning at that time.  He did not have any wounds on his feet.  ABIs could not be obtained as his arteries were not compressible.  However, he had triphasic posterior tibial signals bilaterally and reasonable toe pressures.  On my exam he had palpable femoral pulses.  I could not palpate pedal pulses.  On the left side he had a small wound at the base of his fourth and fifth toes as documented on the photograph from my note.  He was to be set up for an arteriogram and comes into the office today.  Continues to have pain in both feet.  The wound on the left foot has not changed significantly.  He would like to have the arteriogram done soon as possible.  Dialyzes on Tuesdays Thursdays and Saturdays.   Past Medical History:  Diagnosis Date   Anemia    Diabetes mellitus without complication ([facility]    patient denies   Dialysis patient ([facility]    End stage chronic kidney disease ([facility]  Hypertension    ICH (intracerebral hemorrhage) ([place]) 05/20/2017   Shoulder pain, left 06/28/2013    Family History  Problem Relation Age of Onset   Colon cancer Neg Hx     SOCIAL HISTORY: Social History   Tobacco Use   Smoking status: Every Day    Packs/day: 0.50    Years: 30.00    Pack years: 15.00    Types: Cigarettes   Smokeless tobacco: Never  Substance Use Topics   Alcohol use: Not Currently    Alcohol/week: 12.0 standard drinks    Types: 12 Cans of  beer per week    Allergies  Allergen Reactions   Dilaudid [Hydromorphone Hcl] Itching and Other (See Comments)    Pt reports itchiness after IM injection     Current Outpatient Medications  Medication Sig Dispense Refill   amLODipine (NORVASC) 10 MG tablet Take 1 tablet (10 mg total) by mouth at bedtime. 90 tablet 0   blood glucose meter kit and supplies KIT Dispense based on patient and insurance preference. Use up to four times daily as directed. (FOR ICD-9 250.00, 250.01). 1 each 0   calcium acetate (PHOSLO) 667 MG capsule Take 1,334 mg by mouth 3 (three) times daily with meals.     cloNIDine (CATAPRES) 0.1 MG tablet Take 0.1 mg by mouth as directed. TAKE 1 TABLET BID BUT ON DIALYSIS DAYS TAKE ONLY AT BEDTIME.     HYDROcodone-acetaminophen (NORCO/VICODIN) 5-325 MG tablet Take 1 tablet by mouth every 4 (four) hours as needed. 10 tablet 0   hydrOXYzine (ATARAX/VISTARIL) 25 MG tablet TAKE 1 TABLET BY MOUTH EVERY 8 HOURS AS NEEDED FOR ANXIETY OR  ITCHING (Patient taking differently: Take 25 mg by mouth in the morning and at bedtime.) 90 tablet 0   lidocaine-prilocaine (EMLA) cream Apply 1 application topically once.     pantoprazole (PROTONIX) 40 MG tablet Take 1 tablet (40 mg total) by mouth 2 (two) times daily. 60 tablet 2   QUEtiapine (SEROQUEL) 50 MG tablet Take 1 tablet (50 mg total) by mouth at bedtime. 30 tablet 0   No current facility-administered medications for this visit.    REVIEW OF SYSTEMS: _0  denotes positive finding, _1  denotes negative finding Cardiac  Comments:  Chest pain or chest pressure:    Shortness of breath upon exertion: x   Short of breath when lying flat:    Irregular heart rhythm:        Vascular    Pain in calf, thigh, or hip brought on by ambulation: x   Pain in feet at night that wakes you up from your sleep:     Blood clot in your veins:    Leg swelling:         Pulmonary    Oxygen at home:    Productive cough:     Wheezing:          Neurologic    Sudden weakness in arms or legs:     Sudden numbness in arms or legs:     Sudden onset of difficulty speaking or slurred speech:    Temporary loss of vision in one eye:     Problems with dizziness:         Gastrointestinal    Blood in stool:     Vomited blood:         Genitourinary    Burning when urinating:     Blood in urine:        Psychiatric    Major  depression:         Hematologic    Bleeding problems:    Problems with blood clotting too easily:        Skin    Rashes or ulcers:        Constitutional    Fever or chills:     PHYSICAL EXAM:   Vitals:   04/04/21 0820  BP: 134/80  Pulse: 85  Resp: 20  Temp: 98.7 F (37.1 C)  SpO2: 96%  Weight: 296 lb (134.3 kg)  Height: _0  (1.803 m)    GENERAL: The patient is a well-nourished male, in no acute distress. The vital signs are documented above. CARDIAC: There is a regular rate and rhythm.  VASCULAR:  I do not detect carotid bruits. On the right side he has a palpable femoral pulse and weakly palpable popliteal pulse.  I cannot palpate pedal pulses.  He has a biphasic posterior tibial signal with a Doppler and a biphasic dorsalis pedis signal on the right. On the left side he has a palpable femoral pulse.  I cannot palpate a popliteal pulse or pedal pulses.  He has a biphasic posterior tibial signal with the Doppler.  He has a monophasic dorsalis pedis signal and a biphasic anterior tibial signal and peroneal signal on the left. He has a good thrill in his left upper arm fistula. PULMONARY: There is good air exchange bilaterally without wheezing or rales. ABDOMEN: Soft and non-tender with normal pitched bowel sounds.  MUSCULOSKELETAL: There are no major deformities or cyanosis. NEUROLOGIC: No focal weakness or paresthesias are detected. SKIN: He has a wound at the base of his left third and fourth toes.  This is not changed.     PSYCHIATRIC: The patient has a normal affect.  DATA:     ARTERIAL DOPPLER STUDY: I have reviewed the arterial Doppler study that was done on 09/18/2020.   On the right side there was a triphasic posterior tibial signal and dorsalis pedis signal.  The arteries were not compressible and therefore an ABI could not be obtained.  However toe pressure was 87 mmHg.   On the left side he had a triphasic dorsalis pedis and posterior tibial signal.  Again the arteries were calcified and not compressible.  An ABI could not be obtained.  Toe pressure was 88 mmHg.   VENOUS DUPLEX: I reviewed the venous duplex scan that was done on 03/25/2021.  This showed no evidence of DVT in either lower extremity.  Deitra Mayo [facility] [phone]

## 2021-04-05 LAB — DRUG SCREEN 10 W/CONF, SERUM
Amphetamines, IA: NEGATIVE ng/mL
Amphetamines, IA: NEGATIVE ng/mL
Barbiturates, IA: NEGATIVE ug/mL
Barbiturates, IA: NEGATIVE ug/mL
Benzodiazepines, IA: NEGATIVE ng/mL
Benzodiazepines, IA: NEGATIVE ng/mL
Cocaine & Metabolite, IA: NEGATIVE ng/mL
Cocaine & Metabolite, IA: NEGATIVE ng/mL
Methadone, IA: NEGATIVE ng/mL
Methadone, IA: NEGATIVE ng/mL
Opiates, IA: POSITIVE ng/mL — AB
Opiates, IA: POSITIVE ng/mL — AB
Oxycodones, IA: NEGATIVE ng/mL
Oxycodones, IA: NEGATIVE ng/mL
Phencyclidine, IA: NEGATIVE ng/mL
Phencyclidine, IA: NEGATIVE ng/mL
Propoxyphene, IA: NEGATIVE ng/mL
Propoxyphene, IA: NEGATIVE ng/mL
THC(Marijuana) Metabolite, IA: NEGATIVE ng/mL
THC(Marijuana) Metabolite, IA: NEGATIVE ng/mL

## 2021-04-05 LAB — OXYCODONES,MS,WB/SP RFX
Oxycocone: NEGATIVE ng/mL
Oxycocone: NEGATIVE ng/mL
Oxycodones Confirmation: NEGATIVE
Oxycodones Confirmation: NEGATIVE
Oxymorphone: NEGATIVE ng/mL
Oxymorphone: NEGATIVE ng/mL

## 2021-04-05 LAB — OPIATES,MS,WB/SP RFX
6-Acetylmorphine: NEGATIVE
6-Acetylmorphine: NEGATIVE
Codeine: NEGATIVE ng/mL
Codeine: NEGATIVE ng/mL
Dihydrocodeine: NEGATIVE ng/mL
Dihydrocodeine: NEGATIVE ng/mL
Hydrocodone: 1.5 ng/mL
Hydrocodone: 1.2 ng/mL
Hydromorphone: NEGATIVE ng/mL
Hydromorphone: NEGATIVE ng/mL
Morphine: NEGATIVE ng/mL
Morphine: NEGATIVE ng/mL
Opiate Confirmation: POSITIVE
Opiate Confirmation: POSITIVE

## 2021-04-08 ENCOUNTER — Observation Stay (HOSPITAL_COMMUNITY)
Admission: RE | Admit: 2021-04-08 | Discharge: 2021-04-09 | Disposition: A | Discharge status: Home / Self Care | Payer: Medicare Other | Attending: Vascular Surgery | Admitting: Vascular Surgery

## 2021-04-08 ENCOUNTER — Encounter (HOSPITAL_COMMUNITY)
Admission: RE | Disposition: A | Discharge status: Home / Self Care | Payer: Self-pay | Source: Home / Self Care | Attending: Vascular Surgery

## 2021-04-08 ENCOUNTER — Other Ambulatory Visit: Payer: Self-pay

## 2021-04-08 DIAGNOSIS — I70245 Atherosclerosis of native arteries of left leg with ulceration of other part of foot: Secondary | ICD-10-CM

## 2021-04-08 DIAGNOSIS — E1122 Type 2 diabetes mellitus with diabetic chronic kidney disease: Secondary | ICD-10-CM | POA: Diagnosis not present

## 2021-04-08 DIAGNOSIS — Z992 Dependence on renal dialysis: Secondary | ICD-10-CM

## 2021-04-08 DIAGNOSIS — E1169 Type 2 diabetes mellitus with other specified complication: Secondary | ICD-10-CM

## 2021-04-08 DIAGNOSIS — N186 End stage renal disease: Secondary | ICD-10-CM | POA: Diagnosis not present

## 2021-04-08 DIAGNOSIS — Z79899 Other long term (current) drug therapy: Secondary | ICD-10-CM | POA: Insufficient documentation

## 2021-04-08 DIAGNOSIS — I120 Hypertensive chronic kidney disease with stage 5 chronic kidney disease or end stage renal disease: Secondary | ICD-10-CM | POA: Insufficient documentation

## 2021-04-08 DIAGNOSIS — L97529 Non-pressure chronic ulcer of other part of left foot with unspecified severity: Secondary | ICD-10-CM | POA: Insufficient documentation

## 2021-04-08 DIAGNOSIS — F17210 Nicotine dependence, cigarettes, uncomplicated: Secondary | ICD-10-CM | POA: Insufficient documentation

## 2021-04-08 DIAGNOSIS — I739 Peripheral vascular disease, unspecified: Secondary | ICD-10-CM | POA: Diagnosis present

## 2021-04-08 HISTORY — PX: ABDOMINAL AORTOGRAM W/LOWER EXTREMITY: CATH118223

## 2021-04-08 LAB — POCT I-STAT, CHEM 8
BUN: 37 mg/dL — ABNORMAL HIGH (ref 8–23)
Calcium, Ion: 1.07 mmol/L — ABNORMAL LOW (ref 1.15–1.40)
Chloride: 99 mmol/L (ref 98–111)
Creatinine, Ser: 12.8 mg/dL — ABNORMAL HIGH (ref 0.61–1.24)
Glucose, Bld: 92 mg/dL (ref 70–99)
HCT: 24 % — ABNORMAL LOW (ref 39.0–52.0)
Hemoglobin: 8.2 g/dL — ABNORMAL LOW (ref 13.0–17.0)
Potassium: 4.7 mmol/L (ref 3.5–5.1)
Sodium: 139 mmol/L (ref 135–145)
TCO2: 31 mmol/L (ref 22–32)

## 2021-04-08 SURGERY — ABDOMINAL AORTOGRAM W/LOWER EXTREMITY
Anesthesia: LOCAL

## 2021-04-08 MED ORDER — PHENOL 1.4 % MT LIQD: 1.0000 | OROMUCOSAL | Status: DC | PRN

## 2021-04-08 MED ORDER — LIDOCAINE HCL (PF) 1 % IJ SOLN: 5.0000 mL | INTRAMUSCULAR | Status: DC | PRN

## 2021-04-08 MED ORDER — CLONIDINE HCL 0.1 MG PO TABS: 0.1000 mg | ORAL_TABLET | ORAL | Status: DC

## 2021-04-08 MED ORDER — SODIUM CHLORIDE 0.9 % IV SOLN: 250.0000 mL | INTRAVENOUS | Status: DC | PRN

## 2021-04-08 MED ORDER — MIDAZOLAM HCL 2 MG/2ML IJ SOLN
INTRAMUSCULAR | Status: AC
  Filled 2021-04-08: qty 2

## 2021-04-08 MED ORDER — LIDOCAINE-PRILOCAINE 2.5-2.5 % EX CREA
1.0000 "application " | TOPICAL_CREAM | CUTANEOUS | Status: DC | PRN
  Filled 2021-04-08: qty 5

## 2021-04-08 MED ORDER — SODIUM CHLORIDE 0.9 % IV SOLN: 100.0000 mL | INTRAVENOUS | Status: DC | PRN

## 2021-04-08 MED ORDER — METOPROLOL TARTRATE 5 MG/5ML IV SOLN
2.0000 mg | INTRAVENOUS | Status: DC | PRN
Start: 2021-04-08 — End: 2021-04-09

## 2021-04-08 MED ORDER — AMLODIPINE BESYLATE 10 MG PO TABS
10.0000 mg | ORAL_TABLET | Freq: Every day | ORAL | Status: DC
  Administered 2021-04-08: 10 mg via ORAL
  Filled 2021-04-08: qty 1

## 2021-04-08 MED ORDER — MORPHINE SULFATE (PF) 2 MG/ML IV SOLN
2.0000 mg | INTRAVENOUS | Status: DC | PRN
  Administered 2021-04-08: 2 mg via INTRAVENOUS
  Filled 2021-04-08: qty 1

## 2021-04-08 MED ORDER — SODIUM THIOSULFATE 250 MG/ML IV SOLN
25.0000 g | INTRAVENOUS | Status: DC
  Administered 2021-04-09: 25 g via INTRAVENOUS
  Filled 2021-04-08 (×2): qty 100

## 2021-04-08 MED ORDER — OXYCODONE HCL 5 MG PO TABS
5.0000 mg | ORAL_TABLET | ORAL | Status: DC | PRN
  Administered 2021-04-08: 10 mg via ORAL
  Filled 2021-04-08: qty 2

## 2021-04-08 MED ORDER — SODIUM CHLORIDE 0.9 % IV SOLN
INTRAVENOUS | Status: DC | PRN
  Administered 2021-04-08: 10 mL/h via INTRAVENOUS

## 2021-04-08 MED ORDER — ACETAMINOPHEN 650 MG RE SUPP: 325.0000 mg | RECTAL | Status: DC | PRN

## 2021-04-08 MED ORDER — SODIUM CHLORIDE 0.9 % IV SOLN: INTRAVENOUS | Status: AC

## 2021-04-08 MED ORDER — MIDAZOLAM HCL 2 MG/2ML IJ SOLN
INTRAMUSCULAR | Status: DC | PRN
  Administered 2021-04-08: 2 mg via INTRAVENOUS

## 2021-04-08 MED ORDER — HEPARIN (PORCINE) IN NACL 1000-0.9 UT/500ML-% IV SOLN
INTRAVENOUS | Status: AC
  Filled 2021-04-08: qty 500

## 2021-04-08 MED ORDER — MORPHINE SULFATE (PF) 2 MG/ML IV SOLN
2.0000 mg | Freq: Once | INTRAVENOUS | Status: AC
  Administered 2021-04-08: 2 mg via INTRAVENOUS
  Filled 2021-04-08: qty 1

## 2021-04-08 MED ORDER — HYDRALAZINE HCL 20 MG/ML IJ SOLN: 5.0000 mg | INTRAMUSCULAR | Status: DC | PRN

## 2021-04-08 MED ORDER — ALUM & MAG HYDROXIDE-SIMETH 200-200-20 MG/5ML PO SUSP: 15.0000 mL | ORAL | Status: DC | PRN

## 2021-04-08 MED ORDER — ACETAMINOPHEN 325 MG PO TABS: 650.0000 mg | ORAL_TABLET | ORAL | Status: DC | PRN

## 2021-04-08 MED ORDER — ACETAMINOPHEN 325 MG PO TABS: 325.0000 mg | ORAL_TABLET | ORAL | Status: DC | PRN

## 2021-04-08 MED ORDER — PENTAFLUOROPROP-TETRAFLUOROETH EX AERO
1.0000 "application " | INHALATION_SPRAY | CUTANEOUS | Status: DC | PRN
  Filled 2021-04-08: qty 116

## 2021-04-08 MED ORDER — PANTOPRAZOLE SODIUM 40 MG PO TBEC
40.0000 mg | DELAYED_RELEASE_TABLET | Freq: Every day | ORAL | Status: DC
  Administered 2021-04-08 – 2021-04-09 (×2): 40 mg via ORAL
  Filled 2021-04-08 (×2): qty 1

## 2021-04-08 MED ORDER — SODIUM CHLORIDE 0.9% FLUSH: 3.0000 mL | Freq: Two times a day (BID) | INTRAVENOUS | Status: DC

## 2021-04-08 MED ORDER — HEPARIN SODIUM (PORCINE) 1000 UNIT/ML DIALYSIS
1000.0000 [IU] | INTRAMUSCULAR | Status: DC | PRN
  Filled 2021-04-08: qty 1

## 2021-04-08 MED ORDER — HEPARIN (PORCINE) IN NACL 1000-0.9 UT/500ML-% IV SOLN
INTRAVENOUS | Status: DC | PRN
  Administered 2021-04-08 (×2): 500 mL

## 2021-04-08 MED ORDER — FENTANYL CITRATE (PF) 100 MCG/2ML IJ SOLN
INTRAMUSCULAR | Status: AC
  Filled 2021-04-08: qty 2

## 2021-04-08 MED ORDER — ALTEPLASE 2 MG IJ SOLR
2.0000 mg | Freq: Once | INTRAMUSCULAR | Status: DC | PRN
  Filled 2021-04-08: qty 2

## 2021-04-08 MED ORDER — LABETALOL HCL 5 MG/ML IV SOLN: 10.0000 mg | INTRAVENOUS | Status: DC | PRN

## 2021-04-08 MED ORDER — LIDOCAINE HCL (PF) 1 % IJ SOLN
INTRAMUSCULAR | Status: DC | PRN
  Administered 2021-04-08: 15 mL

## 2021-04-08 MED ORDER — QUETIAPINE FUMARATE 50 MG PO TABS
50.0000 mg | ORAL_TABLET | Freq: Every day | ORAL | Status: DC
  Administered 2021-04-08: 50 mg via ORAL
  Filled 2021-04-08: qty 1

## 2021-04-08 MED ORDER — GUAIFENESIN-DM 100-10 MG/5ML PO SYRP: 15.0000 mL | ORAL_SOLUTION | ORAL | Status: DC | PRN

## 2021-04-08 MED ORDER — HYDROCODONE-ACETAMINOPHEN 5-325 MG PO TABS
1.0000 | ORAL_TABLET | ORAL | Status: DC | PRN
Start: 2021-04-08 — End: 2021-04-09
  Administered 2021-04-08 – 2021-04-09 (×2): 2 via ORAL
  Filled 2021-04-08 (×2): qty 2

## 2021-04-08 MED ORDER — LIDOCAINE HCL (PF) 1 % IJ SOLN
INTRAMUSCULAR | Status: AC
  Filled 2021-04-08: qty 30

## 2021-04-08 MED ORDER — SODIUM CHLORIDE 0.9% FLUSH: 3.0000 mL | INTRAVENOUS | Status: DC | PRN

## 2021-04-08 MED ORDER — HYDROXYZINE HCL 25 MG PO TABS
25.0000 mg | ORAL_TABLET | Freq: Two times a day (BID) | ORAL | Status: DC | PRN
  Administered 2021-04-08: 25 mg via ORAL
  Filled 2021-04-08: qty 1

## 2021-04-08 MED ORDER — FENTANYL CITRATE (PF) 100 MCG/2ML IJ SOLN
INTRAMUSCULAR | Status: DC | PRN
  Administered 2021-04-08: 50 ug via INTRAVENOUS

## 2021-04-08 SURGICAL SUPPLY — 11 items
CATH OMNI FLUSH 5F 65CM (CATHETERS) ×1 IMPLANT
CLOSURE MYNX CONTROL 5F (Vascular Products) ×1 IMPLANT
KIT MICROPUNCTURE NIT STIFF (SHEATH) ×1 IMPLANT
KIT PV (KITS) ×2 IMPLANT
MAT PREVALON FULL STRYKER (MISCELLANEOUS) ×1 IMPLANT
SHEATH PINNACLE 5F 10CM (SHEATH) ×1 IMPLANT
SHEATH PROBE COVER 6X72 (BAG) ×1 IMPLANT
SYR MEDRAD MARK V 150ML (SYRINGE) ×1 IMPLANT
TRANSDUCER W/STOPCOCK (MISCELLANEOUS) ×2 IMPLANT
TRAY PV CATH (CUSTOM PROCEDURE TRAY) ×2 IMPLANT
WIRE BENTSON .035X145CM (WIRE) ×1 IMPLANT

## 2021-04-08 NOTE — Progress Notes (Signed)
Report received from Michelle, RN

## 2021-04-08 NOTE — Interval H&P Note (Signed)
History and Physical Interval Note:  04/08/2021 7:30 AM  Daniel [name]  has presented today for surgery, with the diagnosis of PAD with ulcer.  The various methods of treatment have been discussed with the patient and family. After consideration of risks, benefits and other options for treatment, the patient has consented to  Procedure(s): ABDOMINAL AORTOGRAM W/LOWER EXTREMITY (N/A) as a surgical intervention.  The patient's history has been reviewed, patient examined, no change in status, stable for surgery.  I have reviewed the patient's chart and labs.  Questions were answered to the patient's satisfaction.     Servando Snare

## 2021-04-08 NOTE — Progress Notes (Signed)
Report called to Patty, RN on 6N.

## 2021-04-08 NOTE — Progress Notes (Signed)
Arrived to 6n1 from pacu. Denies nausea/pain at this time.

## 2021-04-08 NOTE — Op Note (Signed)
    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 Donzetta Matters, MD Procedure Performed: 1.  Ultrasound-guided cannulation right common femoral artery 2.  Aortogram with bilateral lower extremity runoff 3.  Mynx device closure right common femoral artery 4.  Moderate sedation with fentanyl and Versed for 29 minutes  Indications: 60 year old male with history of end-stage renal disease currently on dialysis via fistula.  He is indicated for aortogram with bilateral lower extremity runoff due to chronic limb threatening ischemia with left lower extremity ulceration.  Findings: The aortoiliac segments are free of flow-limiting stenosis.  Bilateral renal arteries do not appear to fill the kidneys consistent with dialysis access.  On the left side there is calcification of the left SFA does not appear to be flow-limiting.  Below the knee the peroneal artery is very large runs off to the ankle where it feeds a posterior tibial artery which is diminutive.  The posterior tibial artery is occluded for a very long segment.  The anterior tibial artery is patent toe the mid leg and then becomes sclerotic does not proceed onto the foot.  On the right side he has calcification of the SFA 1 area with a large calcified segment that appears to obscure flow but does not appear to impede flow.  Below the knee appears to have posterior tibial artery runoff to the foot.  Patient does not appear to have any endovascular or open surgical options to improve flow into the left foot.  Will follow patient up to evaluate his wound possibly would need transmetatarsal amputation versus below-knee amputation.   Procedure:  The patient was identified in the holding area and taken to room 8.  The patient was then placed supine  on the table and prepped and draped in the usual sterile fashion.  A time out was called.  Ultrasound was used to evaluate the right common femoral artery.  This was calcified however free of flow-limiting stenosis.  There is anesthetized 1% lidocaine cannulated with direct ultrasound visualization with micropuncture needle followed by wire sheath.  Images saved in permanent record.  Bentson wires placed followed by 5 [place] sheath.  We performed aortogram followed by bilateral extremity runoff.  With the above findings I elected no intervention.  Catheter was removed over wire.  I performed retrograde angiogram of the right common femoral artery.  I deployed a minx device which supplied well.  He tolerated procedure without any complication  Contrast: 78GN     Trayce Maino C. Donzetta Matters, MD [facility] [place] Office: [phone] Pager: [phone]

## 2021-04-08 NOTE — Progress Notes (Signed)
60 year old male with ESRD chronic HD TTS ([facility]), hypertension, PAD, calciphylaxis lower extremity (on outpatient sodium thiosulfate at dialysis )EtOH, cocaine abuse, tobacco,non  compliance with dialysis admitted to observation status post Dr. Donzetta Matters bilateral lower extremity arteriogram to evaluate ulceration with threatening ischemia left lower extremity .  Seen post procedure in short stay with complaints of discomfort of left lower extremity last HD was Saturday 04/06/21. (Missed 12/08, 12/06 HD) Plan to do dialysis tomorrow in hospital unless he can do outpatient HD.  We will do formal consult if he is admitted from observation to inpatient  OP HD= TTS ADM 0.4 hours EDW 128 Hgb\ 2K, 2 CA bath,LUA AVF Venofer 100 last dose 12/13 Mircera 225 MCG last dose 04/06/21 Hectorol 3 mics q. dialysis No heparin Sodium thiosulfate 25% 25 g q. dialysis  Ernest Haber, PA-C [place] 04/08/2021,4:40 PM  LOS: 0 days

## 2021-04-08 NOTE — Progress Notes (Addendum)
Contacted by PA to request that pt receive out-pt HD tomorrow at pt's clinic after d/c. Spoke to [place] at [facility] ([facility]) who states that clinic does not have an appointment available for pt at this time. Clinic advised navigator that clinic can be contacted tomorrow morning to see if anyone has cancelled. Update provided to PA.    Melven Sartorius Renal Navigator [phone]   Addendum at 4:24 pm: Contacted inpt HD unit to request that pt be placed on 2nd shift tomorrow just in case pt can receive treatment as out-pt. Navigator to contact pt's clinic in the am to see if an appt has opened up in order for pt to receive treatment out-pt to avoid HD on day of d/c. Discussed with renal MD and PA.

## 2021-04-09 ENCOUNTER — Encounter (HOSPITAL_COMMUNITY): Payer: Self-pay | Admitting: Vascular Surgery

## 2021-04-09 DIAGNOSIS — N186 End stage renal disease: Secondary | ICD-10-CM | POA: Diagnosis not present

## 2021-04-09 DIAGNOSIS — I70245 Atherosclerosis of native arteries of left leg with ulceration of other part of foot: Secondary | ICD-10-CM | POA: Diagnosis not present

## 2021-04-09 DIAGNOSIS — Z992 Dependence on renal dialysis: Secondary | ICD-10-CM | POA: Diagnosis not present

## 2021-04-09 LAB — CBC
HCT: 24.5 % — ABNORMAL LOW (ref 39.0–52.0)
Hemoglobin: 7.6 g/dL — ABNORMAL LOW (ref 13.0–17.0)
MCH: 32.3 pg (ref 26.0–34.0)
MCHC: 31 g/dL (ref 30.0–36.0)
MCV: 104.3 fL — ABNORMAL HIGH (ref 80.0–100.0)
Platelets: 165 10*3/uL (ref 150–400)
RBC: 2.35 MIL/uL — ABNORMAL LOW (ref 4.22–5.81)
RDW: 19.2 % — ABNORMAL HIGH (ref 11.5–15.5)
WBC: 5.1 10*3/uL (ref 4.0–10.5)
nRBC: 1 % — ABNORMAL HIGH (ref 0.0–0.2)

## 2021-04-09 LAB — RENAL FUNCTION PANEL
Albumin: 2.3 g/dL — ABNORMAL LOW (ref 3.5–5.0)
Anion gap: 17 — ABNORMAL HIGH (ref 5–15)
BUN: 40 mg/dL — ABNORMAL HIGH (ref 8–23)
CO2: 25 mmol/L (ref 22–32)
Calcium: 8.9 mg/dL (ref 8.9–10.3)
Chloride: 97 mmol/L — ABNORMAL LOW (ref 98–111)
Creatinine, Ser: 12.93 mg/dL — ABNORMAL HIGH (ref 0.61–1.24)
GFR, Estimated: 4 mL/min — ABNORMAL LOW (ref >60)
Glucose, Bld: 89 mg/dL (ref 70–99)
Phosphorus: 5.7 mg/dL — ABNORMAL HIGH (ref 2.5–4.6)
Potassium: 4.6 mmol/L (ref 3.5–5.1)
Sodium: 139 mmol/L (ref 135–145)

## 2021-04-09 MED ORDER — GABAPENTIN 100 MG PO CAPS
100.0000 mg | ORAL_CAPSULE | Freq: Three times a day (TID) | ORAL | 3 refills | Status: DC
Start: 2021-04-09 — End: 2021-08-10

## 2021-04-09 MED ORDER — GABAPENTIN 100 MG PO CAPS
100.0000 mg | ORAL_CAPSULE | Freq: Three times a day (TID) | ORAL | Status: DC
  Filled 2021-04-09 (×2): qty 1

## 2021-04-09 MED ORDER — HYDROCODONE-ACETAMINOPHEN 5-325 MG PO TABS
1.0000 | ORAL_TABLET | Freq: Four times a day (QID) | ORAL | 0 refills | Status: DC | PRN
Start: 2021-04-09 — End: 2021-04-22

## 2021-04-09 NOTE — Progress Notes (Signed)
Contacted [facility] ([facility]) and spoke to [employer], Agricultural consultant. Clinic does not have an appt available for pt this afternoon. Update provided to nephrologist and pt's RN. Pt will need treatment here. Inpt unit aware.   Melven Sartorius Renal Navigator [phone]

## 2021-04-09 NOTE — Procedures (Signed)
° °  I was present at this dialysis session, have reviewed the session itself and made  appropriate changes Kelly Splinter MD [place] pager [phone]   04/09/2021, 2:27 PM

## 2021-04-09 NOTE — Progress Notes (Addendum)
  Progress Note    04/09/2021 8:32 AM 1 Day Post-Op  Subjective:  no major complaints   Vitals:   04/09/21 0421 04/09/21 0716  BP: (!) 146/80 118/80  Pulse: 79 79  Resp: 18 18  Temp: (!) 97.5 F (36.4 C) 97.6 F (36.4 C)  SpO2: 95% 96%   Physical Exam: Cardiac:  regular Lungs:  non labored Extremities: right femoral access site c/d/I without swelling or hematoma. Left upper extremity AVF aneurysmal, pulsatile with hypopigmentation. Skin mobile over hypopigmented area Abdomen:  obese, soft Neurologic: alert and oriented  CBC    Component Value Date/Time   WBC 5.1 04/09/2021 0415   RBC 2.35 (L) 04/09/2021 0415   HGB 7.6 (L) 04/09/2021 0415   HGB 8.4 (L) 08/22/2020 1141   HCT 24.5 (L) 04/09/2021 0415   HCT 25.0 (L) 08/22/2020 1141   PLT 165 04/09/2021 0415   PLT 184 08/22/2020 1141   MCV 104.3 (H) 04/09/2021 0415   MCV 94 08/22/2020 1141   MCH 32.3 04/09/2021 0415   MCHC 31.0 04/09/2021 0415   RDW 19.2 (H) 04/09/2021 0415   RDW 16.7 (H) 08/22/2020 1141   LYMPHSABS 2.0 03/30/2021 1133   LYMPHSABS 1.8 08/22/2020 1141   MONOABS 0.6 03/30/2021 1133   EOSABS 0.1 03/30/2021 1133   EOSABS 0.3 08/22/2020 1141   BASOSABS 0.0 03/30/2021 1133   BASOSABS 0.0 08/22/2020 1141    BMET    Component Value Date/Time   NA 139 04/08/2021 0724   NA 138 08/22/2020 1141   K 4.7 04/08/2021 0724   CL 99 04/08/2021 0724   CO2 30 03/30/2021 1133   GLUCOSE 92 04/08/2021 0724   BUN 37 (H) 04/08/2021 0724   BUN 20 08/22/2020 1141   CREATININE 12.80 (H) 04/08/2021 0724   CALCIUM 8.5 (L) 03/30/2021 1133   CALCIUM 7.9 (L) 08/01/2019 0702   GFRNONAA 12 (L) 03/30/2021 1133   GFRAA 7 (L) 12/28/2019 1640    INR    Component Value Date/Time   INR 1.0 03/24/2021 1831    No intake or output data in the 24 hours ending 04/09/21 1761   Assessment/Plan:  60 y.o. male is s/p Angiogram 1 Day Post-Op   No revascularization options available Right groin without swelling or  hematoma He has Left AVF with some pulsatility and hypopigmentation. This can be evaluated further at time of his follow up in the office Waiting on Renal Navigator to update on outpatient dialysis seat availability or patient otherwise on schedule to have IP HD second shift and then will discharge home after May need assistance with transportation. Lives at group home. TOC consulted for assistance He will follow up in 1-2 weeks with Dr. Scot Dock to discuss possible TMA vs BKA   Karoline Caldwell, PA-C [facility] [phone] 04/09/2021 8:32 AM  I have independently interviewed and examined patient and agree with PA assessment and plan above. HD today then possible home. I have written for neurontin 100mg  tid and sent Rx to his pharmacy.   Wyatte Dames C. Donzetta Matters, MD [facility] [place] Office: [phone] Pager: [phone]

## 2021-04-09 NOTE — Progress Notes (Signed)
Discharge instructions given to patient, patient verbalizes understanding. IV removed. Patient taken down to vehicle. Patient discharged

## 2021-04-09 NOTE — Social Work (Addendum)
TOC consulted for possible transportation needs. CSW will place taxi voucher on pts chart. Nurse to call taxi when ready for DC.   Emeterio Reeve, LCSW Clinical Social Worker

## 2021-04-09 NOTE — Discharge Instructions (Signed)
  [facility] [place]  Discharge Instructions  Lower Extremity Angiogram; Angioplasty/Stenting  Please refer to the following instructions for your post-procedure care. Your surgeon or physician assistant will discuss any changes with you.  Activity  Avoid lifting more than 8 pounds (1 gallons of milk) for 5 days after your procedure. You may walk as much as you can tolerate. It's OK to drive after 72 hours.  Bathing/Showering  You may shower the day after your procedure. If you have a bandage, you may remove it at 24- 48 hours. Clean your incision site with mild soap and water. Pat the area dry with a clean towel.  Diet  Resume your pre-procedure diet. There are no special food restrictions following this procedure. All patients with peripheral vascular disease should follow a low fat/low cholesterol diet. In order to heal from your surgery, it is CRITICAL to get adequate nutrition. Your body requires vitamins, minerals, and protein. Vegetables are the best source of vitamins and minerals. Vegetables also provide the perfect balance of protein. Processed food has little nutritional value, so try to avoid this.  Medications  Resume taking all of your medications unless your doctor tells you not to. If your incision is causing pain, you may take over-the-counter pain relievers such as acetaminophen (Tylenol)  Follow Up  Follow up will be arranged at the time of your procedure. You may have an office visit scheduled or may be scheduled for surgery. Ask your surgeon if you have any questions.  Please call us immediately for any of the following conditions: .Severe or worsening pain your legs or feet at rest or with walking. .Increased pain, redness, drainage at your groin puncture site. .Fever of 101 degrees or higher. .If you have any mild or slow bleeding from your puncture site: lie down, apply firm constant pressure over the area with a piece of gauze or a  clean wash cloth for 30 minutes- no peeking!, call 911 right away if you are still bleeding after 30 minutes, or if the bleeding is heavy and unmanageable.  Reduce your risk factors of vascular disease:  . Stop smoking. If you would like help call [facility] at 1-800-QUIT-NOW (1-800-784-8669) or [place] at 336-586-4000. . Manage your cholesterol . Maintain a desired weight . Control your diabetes . Keep your blood pressure down .  If you have any questions, please call the office at [phone] 

## 2021-04-10 ENCOUNTER — Telehealth: Payer: Self-pay

## 2021-04-10 NOTE — Telephone Encounter (Signed)
Transition Care Management Unsuccessful Follow-up Telephone Call  Date of discharge and from where:  [facility] on 04/09/2021  Attempts:  1st Attempt  Reason for unsuccessful TCM follow-up call:  Left voice message unable to reach pt at [phone] . Pt need a HFU appt with PCP

## 2021-04-11 ENCOUNTER — Telehealth: Payer: Self-pay

## 2021-04-11 NOTE — Telephone Encounter (Signed)
Transition Care Management Unsuccessful Follow-up Telephone Call  Date of discharge and from where:  04/09/2021, [facility]   Attempts:  2nd Attempt  Reason for unsuccessful TCM follow-up call:  Left voice message on # [phone].  Call back requested to this CM

## 2021-04-12 ENCOUNTER — Telehealth: Payer: Self-pay

## 2021-04-12 NOTE — Telephone Encounter (Signed)
Transition Care Management Unsuccessful Follow-up Telephone Call   Date of discharge and from where:  04/09/2021, [facility]    Attempts:  3rd Attempt   Reason for unsuccessful TCM follow-up call:  Called pt person answered stated not a good time to talk. Called again Left voice message on # [phone].

## 2021-04-12 NOTE — Discharge Summary (Signed)
Physician Discharge Summary  Patient ID: Daniel [name] MRN: [id number] DOB/AGE: 06-30-1960 60 y.o.  Admit date: 04/08/2021 Discharge date: 04-09-2021  Admission Diagnosis: End-stage renal disease Left foot ulceration  Discharge Diagnoses:  Same  Secondary Diagnoses: Principal Problem:   PAD (peripheral artery disease) ([place])   Procedures: Procedure Performed: 1.  Ultrasound-guided cannulation right common femoral artery 2.  Aortogram with bilateral lower extremity runoff 3.  Mynx device closure right common femoral artery 4.  Moderate sedation with fentanyl and Versed for 29 minutes  Discharged Condition: stable  Hospital Course: Patient was admitted for overnight monitoring after angiography of his left lower extremity.  Following day he had dialysis and then was discharged uneventfully.  Consults:  Treatment Team:  Waynetta Sandy, MD Roney Jaffe, MD  Significant Diagnostic Studies: CBC CBC Latest Ref Rng & Units 04/09/2021 04/08/2021 03/30/2021  WBC 4.0 - 10.5 K/uL 5.1 - 6.3  Hemoglobin 13.0 - 17.0 g/dL 7.6(L) 8.2(L) 9.2(L)  Hematocrit 39.0 - 52.0 % 24.5(L) 24.0(L) 29.0(L)  Platelets 150 - 400 K/uL 165 - 146(L)     COAG Lab Results  Component Value Date   INR 1.0 03/24/2021   INR 1.1 10/28/2020   INR 1.1 10/27/2020   No results found for: PTT  Disposition: Discharge disposition: 01-Home or Self Care       Discharge Instructions     Discharge patient   Complete by: As directed    Discharge disposition: 01-Home or Self Care   Discharge patient date: 04/09/2021      Allergies as of 04/09/2021       Reactions   Dilaudid [hydromorphone Hcl] Itching, Other (See Comments)   Pt reports itchiness after IM injection         Medication List     TAKE these medications    amLODipine 10 MG tablet Commonly known as: NORVASC Take 1 tablet (10 mg total) by mouth at bedtime.   blood glucose meter kit and supplies  Kit Dispense based on patient and insurance preference. Use up to four times daily as directed. (FOR ICD-9 250.00, 250.01).   calcium acetate 667 MG capsule Commonly known as: PHOSLO Take 1,334 mg by mouth 3 (three) times daily with meals.   cloNIDine 0.1 MG tablet Commonly known as: CATAPRES Take 0.1 mg by mouth as directed. TAKE 1 TABLET BID BUT ON DIALYSIS DAYS TAKE ONLY AT BEDTIME.   gabapentin 100 MG capsule Commonly known as: NEURONTIN Take 1 capsule (100 mg total) by mouth 3 (three) times daily.   HYDROcodone-acetaminophen 5-325 MG tablet Commonly known as: NORCO/VICODIN Take 1 tablet by mouth every 6 (six) hours as needed for moderate pain. What changed:  when to take this reasons to take this   hydrOXYzine 25 MG tablet Commonly known as: ATARAX TAKE 1 TABLET BY MOUTH EVERY 8 HOURS AS NEEDED FOR ANXIETY OR  ITCHING What changed: See the new instructions.   lidocaine-prilocaine cream Commonly known as: EMLA Apply 1 application topically once.   pantoprazole 40 MG tablet Commonly known as: PROTONIX Take 1 tablet (40 mg total) by mouth 2 (two) times daily.   QUEtiapine 50 MG tablet Commonly known as: SEROQUEL Take 1 tablet (50 mg total) by mouth at bedtime.        Follow-up Information     [facility] Follow up.   Why: 1-2 weeks. The office will call the patient with an appointment Contact information: [address] [place] [phone]  Signed: Eda Paschal. Donzetta Matters, MD [facility] [place] Office: [phone] Pager: [phone]   04/12/2021, 12:53 PM

## 2021-04-14 ENCOUNTER — Emergency Department (HOSPITAL_COMMUNITY): Payer: Medicare Other

## 2021-04-14 ENCOUNTER — Other Ambulatory Visit: Payer: Self-pay

## 2021-04-14 ENCOUNTER — Encounter (HOSPITAL_COMMUNITY): Payer: Self-pay | Admitting: Emergency Medicine

## 2021-04-14 ENCOUNTER — Inpatient Hospital Stay (HOSPITAL_COMMUNITY)
Admission: EM | Admit: 2021-04-14 | Discharge: 2021-04-25 | DRG: 270 | Disposition: A | Discharge status: Skilled Nursing Facility | Payer: Medicare Other | Attending: Internal Medicine | Admitting: Internal Medicine

## 2021-04-14 DIAGNOSIS — F39 Unspecified mood [affective] disorder: Secondary | ICD-10-CM | POA: Diagnosis present

## 2021-04-14 DIAGNOSIS — Z79899 Other long term (current) drug therapy: Secondary | ICD-10-CM

## 2021-04-14 DIAGNOSIS — Z20822 Contact with and (suspected) exposure to covid-19: Secondary | ICD-10-CM | POA: Diagnosis present

## 2021-04-14 DIAGNOSIS — R609 Edema, unspecified: Secondary | ICD-10-CM | POA: Diagnosis not present

## 2021-04-14 DIAGNOSIS — I10 Essential (primary) hypertension: Secondary | ICD-10-CM | POA: Diagnosis not present

## 2021-04-14 DIAGNOSIS — E669 Obesity, unspecified: Secondary | ICD-10-CM | POA: Diagnosis present

## 2021-04-14 DIAGNOSIS — I70245 Atherosclerosis of native arteries of left leg with ulceration of other part of foot: Secondary | ICD-10-CM | POA: Diagnosis not present

## 2021-04-14 DIAGNOSIS — M8618 Other acute osteomyelitis, other site: Secondary | ICD-10-CM | POA: Diagnosis present

## 2021-04-14 DIAGNOSIS — E118 Type 2 diabetes mellitus with unspecified complications: Secondary | ICD-10-CM | POA: Diagnosis not present

## 2021-04-14 DIAGNOSIS — E1140 Type 2 diabetes mellitus with diabetic neuropathy, unspecified: Secondary | ICD-10-CM | POA: Diagnosis present

## 2021-04-14 DIAGNOSIS — Z888 Allergy status to other drugs, medicaments and biological substances status: Secondary | ICD-10-CM | POA: Diagnosis not present

## 2021-04-14 DIAGNOSIS — D631 Anemia in chronic kidney disease: Secondary | ICD-10-CM | POA: Diagnosis present

## 2021-04-14 DIAGNOSIS — E1169 Type 2 diabetes mellitus with other specified complication: Secondary | ICD-10-CM | POA: Diagnosis present

## 2021-04-14 DIAGNOSIS — K5900 Constipation, unspecified: Secondary | ICD-10-CM | POA: Diagnosis present

## 2021-04-14 DIAGNOSIS — Z6839 Body mass index (BMI) 39.0-39.9, adult: Secondary | ICD-10-CM | POA: Diagnosis not present

## 2021-04-14 DIAGNOSIS — L97529 Non-pressure chronic ulcer of other part of left foot with unspecified severity: Secondary | ICD-10-CM | POA: Diagnosis present

## 2021-04-14 DIAGNOSIS — Y828 Other medical devices associated with adverse incidents: Secondary | ICD-10-CM | POA: Diagnosis present

## 2021-04-14 DIAGNOSIS — E11628 Type 2 diabetes mellitus with other skin complications: Secondary | ICD-10-CM | POA: Diagnosis present

## 2021-04-14 DIAGNOSIS — I120 Hypertensive chronic kidney disease with stage 5 chronic kidney disease or end stage renal disease: Secondary | ICD-10-CM | POA: Diagnosis present

## 2021-04-14 DIAGNOSIS — Z992 Dependence on renal dialysis: Secondary | ICD-10-CM | POA: Diagnosis not present

## 2021-04-14 DIAGNOSIS — Q2739 Arteriovenous malformation, other site: Secondary | ICD-10-CM

## 2021-04-14 DIAGNOSIS — L089 Local infection of the skin and subcutaneous tissue, unspecified: Secondary | ICD-10-CM | POA: Diagnosis not present

## 2021-04-14 DIAGNOSIS — N2581 Secondary hyperparathyroidism of renal origin: Secondary | ICD-10-CM | POA: Diagnosis present

## 2021-04-14 DIAGNOSIS — N186 End stage renal disease: Secondary | ICD-10-CM | POA: Diagnosis present

## 2021-04-14 DIAGNOSIS — F17210 Nicotine dependence, cigarettes, uncomplicated: Secondary | ICD-10-CM | POA: Diagnosis present

## 2021-04-14 DIAGNOSIS — T82898A Other specified complication of vascular prosthetic devices, implants and grafts, initial encounter: Secondary | ICD-10-CM | POA: Diagnosis present

## 2021-04-14 DIAGNOSIS — E1122 Type 2 diabetes mellitus with diabetic chronic kidney disease: Secondary | ICD-10-CM | POA: Diagnosis present

## 2021-04-14 DIAGNOSIS — Y92239 Unspecified place in hospital as the place of occurrence of the external cause: Secondary | ICD-10-CM | POA: Diagnosis present

## 2021-04-14 DIAGNOSIS — R197 Diarrhea, unspecified: Secondary | ICD-10-CM | POA: Diagnosis present

## 2021-04-14 DIAGNOSIS — E1151 Type 2 diabetes mellitus with diabetic peripheral angiopathy without gangrene: Principal | ICD-10-CM | POA: Diagnosis present

## 2021-04-14 DIAGNOSIS — E11621 Type 2 diabetes mellitus with foot ulcer: Secondary | ICD-10-CM | POA: Diagnosis present

## 2021-04-14 DIAGNOSIS — L03116 Cellulitis of left lower limb: Secondary | ICD-10-CM | POA: Diagnosis present

## 2021-04-14 DIAGNOSIS — I739 Peripheral vascular disease, unspecified: Secondary | ICD-10-CM

## 2021-04-14 DIAGNOSIS — G8929 Other chronic pain: Secondary | ICD-10-CM | POA: Diagnosis present

## 2021-04-14 DIAGNOSIS — M898X9 Other specified disorders of bone, unspecified site: Secondary | ICD-10-CM | POA: Diagnosis present

## 2021-04-14 DIAGNOSIS — R259 Unspecified abnormal involuntary movements: Secondary | ICD-10-CM | POA: Diagnosis not present

## 2021-04-14 DIAGNOSIS — Z5900 Homelessness unspecified: Secondary | ICD-10-CM

## 2021-04-14 HISTORY — DX: Peripheral vascular disease, unspecified: I73.9

## 2021-04-14 LAB — CBC WITH DIFFERENTIAL/PLATELET
Abs Immature Granulocytes: 0.1 10*3/uL — ABNORMAL HIGH (ref 0.00–0.07)
Basophils Absolute: 0 10*3/uL (ref 0.0–0.1)
Basophils Relative: 1 %
Eosinophils Absolute: 0.1 10*3/uL (ref 0.0–0.5)
Eosinophils Relative: 2 %
HCT: 28.9 % — ABNORMAL LOW (ref 39.0–52.0)
Hemoglobin: 8.9 g/dL — ABNORMAL LOW (ref 13.0–17.0)
Immature Granulocytes: 2 %
Lymphocytes Relative: 30 %
Lymphs Abs: 1.9 10*3/uL (ref 0.7–4.0)
MCH: 33.5 pg (ref 26.0–34.0)
MCHC: 30.8 g/dL (ref 30.0–36.0)
MCV: 108.6 fL — ABNORMAL HIGH (ref 80.0–100.0)
Monocytes Absolute: 0.8 10*3/uL (ref 0.1–1.0)
Monocytes Relative: 12 %
Neutro Abs: 3.3 10*3/uL (ref 1.7–7.7)
Neutrophils Relative %: 53 %
Platelets: 203 10*3/uL (ref 150–400)
RBC: 2.66 MIL/uL — ABNORMAL LOW (ref 4.22–5.81)
RDW: 20.1 % — ABNORMAL HIGH (ref 11.5–15.5)
WBC: 6.2 10*3/uL (ref 4.0–10.5)
nRBC: 2.3 % — ABNORMAL HIGH (ref 0.0–0.2)

## 2021-04-14 LAB — BASIC METABOLIC PANEL
Anion gap: 12 (ref 5–15)
BUN: 22 mg/dL (ref 8–23)
CO2: 28 mmol/L (ref 22–32)
Calcium: 9.4 mg/dL (ref 8.9–10.3)
Chloride: 96 mmol/L — ABNORMAL LOW (ref 98–111)
Creatinine, Ser: 8.65 mg/dL — ABNORMAL HIGH (ref 0.61–1.24)
GFR, Estimated: 6 mL/min — ABNORMAL LOW (ref >60)
Glucose, Bld: 136 mg/dL — ABNORMAL HIGH (ref 70–99)
Potassium: 4.2 mmol/L (ref 3.5–5.1)
Sodium: 136 mmol/L (ref 135–145)

## 2021-04-14 LAB — PREALBUMIN: Prealbumin: 14.7 mg/dL — ABNORMAL LOW (ref 18–38)

## 2021-04-14 LAB — SEDIMENTATION RATE: Sed Rate: >140 mm/hr — ABNORMAL HIGH (ref 0–16)

## 2021-04-14 LAB — CBG MONITORING, ED
Glucose-Capillary: 118 mg/dL — ABNORMAL HIGH (ref 70–99)
Glucose-Capillary: 112 mg/dL — ABNORMAL HIGH (ref 70–99)
Glucose-Capillary: 130 mg/dL — ABNORMAL HIGH (ref 70–99)

## 2021-04-14 LAB — RESP PANEL BY RT-PCR (FLU A&B, COVID) ARPGX2
Influenza A by PCR: NEGATIVE
Influenza B by PCR: NEGATIVE
SARS Coronavirus 2 by RT PCR: NEGATIVE

## 2021-04-14 LAB — C-REACTIVE PROTEIN: CRP: 1.7 mg/dL — ABNORMAL HIGH (ref <1.0)

## 2021-04-14 LAB — LACTIC ACID, PLASMA: Lactic Acid, Venous: 1.6 mmol/L (ref 0.5–1.9)

## 2021-04-14 MED ORDER — ACETAMINOPHEN 650 MG RE SUPP: 650.0000 mg | Freq: Four times a day (QID) | RECTAL | Status: DC | PRN

## 2021-04-14 MED ORDER — PIPERACILLIN-TAZOBACTAM IN DEX 2-0.25 GM/50ML IV SOLN
2.2500 g | Freq: Three times a day (TID) | INTRAVENOUS | Status: DC
  Filled 2021-04-14 (×2): qty 50

## 2021-04-14 MED ORDER — HYDRALAZINE HCL 20 MG/ML IJ SOLN: 5.0000 mg | INTRAMUSCULAR | Status: DC | PRN

## 2021-04-14 MED ORDER — CAMPHOR-MENTHOL 0.5-0.5 % EX LOTN: 1.0000 "application " | TOPICAL_LOTION | Freq: Three times a day (TID) | CUTANEOUS | Status: DC | PRN

## 2021-04-14 MED ORDER — PANTOPRAZOLE SODIUM 40 MG PO TBEC
40.0000 mg | DELAYED_RELEASE_TABLET | Freq: Two times a day (BID) | ORAL | Status: DC
  Administered 2021-04-14 – 2021-04-25 (×21): 40 mg via ORAL
  Filled 2021-04-14 (×22): qty 1

## 2021-04-14 MED ORDER — NEPRO/CARBSTEADY PO LIQD: 237.0000 mL | Freq: Three times a day (TID) | ORAL | Status: DC | PRN

## 2021-04-14 MED ORDER — HEPARIN SODIUM (PORCINE) 5000 UNIT/ML IJ SOLN
5000.0000 [IU] | Freq: Three times a day (TID) | INTRAMUSCULAR | Status: DC
  Administered 2021-04-14 – 2021-04-25 (×30): 5000 [IU] via SUBCUTANEOUS
  Filled 2021-04-14 (×30): qty 1

## 2021-04-14 MED ORDER — DOCUSATE SODIUM 283 MG RE ENEM: 1.0000 | ENEMA | RECTAL | Status: DC | PRN

## 2021-04-14 MED ORDER — FENTANYL CITRATE PF 50 MCG/ML IJ SOSY
50.0000 ug | PREFILLED_SYRINGE | Freq: Once | INTRAMUSCULAR | Status: AC
  Administered 2021-04-14: 11:00:00 50 ug via INTRAVENOUS
  Filled 2021-04-14: qty 1

## 2021-04-14 MED ORDER — CLONIDINE HCL 0.1 MG PO TABS: 0.1000 mg | ORAL_TABLET | ORAL | Status: DC

## 2021-04-14 MED ORDER — ACETAMINOPHEN 325 MG PO TABS
650.0000 mg | ORAL_TABLET | Freq: Four times a day (QID) | ORAL | Status: DC | PRN
  Administered 2021-04-15 – 2021-04-20 (×4): 650 mg via ORAL
  Filled 2021-04-14 (×4): qty 2

## 2021-04-14 MED ORDER — HYDROXYZINE HCL 25 MG PO TABS
25.0000 mg | ORAL_TABLET | Freq: Three times a day (TID) | ORAL | Status: DC | PRN
  Administered 2021-04-16 – 2021-04-25 (×6): 25 mg via ORAL
  Filled 2021-04-14 (×6): qty 1

## 2021-04-14 MED ORDER — ZOLPIDEM TARTRATE 5 MG PO TABS: 5.0000 mg | ORAL_TABLET | Freq: Every evening | ORAL | Status: DC | PRN

## 2021-04-14 MED ORDER — CLONIDINE HCL 0.1 MG PO TABS
0.1000 mg | ORAL_TABLET | ORAL | Status: DC
  Administered 2021-04-14 – 2021-04-24 (×9): 0.1 mg via ORAL
  Filled 2021-04-14 (×9): qty 1

## 2021-04-14 MED ORDER — QUETIAPINE FUMARATE 25 MG PO TABS
50.0000 mg | ORAL_TABLET | Freq: Every day | ORAL | Status: DC
  Administered 2021-04-14 – 2021-04-19 (×6): 50 mg via ORAL
  Filled 2021-04-14 (×3): qty 2
  Filled 2021-04-14: qty 1
  Filled 2021-04-14 (×2): qty 2
  Filled 2021-04-14: qty 1

## 2021-04-14 MED ORDER — OXYCODONE-ACETAMINOPHEN 5-325 MG PO TABS
1.0000 | ORAL_TABLET | Freq: Once | ORAL | Status: AC
  Administered 2021-04-14: 09:00:00 1 via ORAL
  Filled 2021-04-14: qty 1

## 2021-04-14 MED ORDER — METRONIDAZOLE 500 MG/100ML IV SOLN
500.0000 mg | Freq: Three times a day (TID) | INTRAVENOUS | Status: AC
  Administered 2021-04-14 – 2021-04-17 (×8): 500 mg via INTRAVENOUS
  Filled 2021-04-14 (×8): qty 100

## 2021-04-14 MED ORDER — CALCIUM CARBONATE ANTACID 1250 MG/5ML PO SUSP: 500.0000 mg | Freq: Four times a day (QID) | ORAL | Status: DC | PRN

## 2021-04-14 MED ORDER — CLONIDINE HCL 0.1 MG PO TABS
0.1000 mg | ORAL_TABLET | ORAL | Status: DC
  Administered 2021-04-16 – 2021-04-23 (×4): 0.1 mg via ORAL
  Filled 2021-04-14 (×5): qty 1

## 2021-04-14 MED ORDER — HYDROCODONE-ACETAMINOPHEN 5-325 MG PO TABS
1.0000 | ORAL_TABLET | Freq: Four times a day (QID) | ORAL | Status: DC | PRN
  Administered 2021-04-14 – 2021-04-15 (×5): 1 via ORAL
  Filled 2021-04-14 (×5): qty 1

## 2021-04-14 MED ORDER — CALCIUM ACETATE (PHOS BINDER) 667 MG PO CAPS
1334.0000 mg | ORAL_CAPSULE | Freq: Three times a day (TID) | ORAL | Status: DC
  Administered 2021-04-14 – 2021-04-25 (×26): 1334 mg via ORAL
  Filled 2021-04-14 (×27): qty 2

## 2021-04-14 MED ORDER — NICOTINE 14 MG/24HR TD PT24
14.0000 mg | MEDICATED_PATCH | Freq: Every day | TRANSDERMAL | Status: DC
  Administered 2021-04-14 – 2021-04-25 (×10): 14 mg via TRANSDERMAL
  Filled 2021-04-14 (×11): qty 1

## 2021-04-14 MED ORDER — AMLODIPINE BESYLATE 10 MG PO TABS
10.0000 mg | ORAL_TABLET | Freq: Every day | ORAL | Status: DC
  Administered 2021-04-14 – 2021-04-24 (×10): 10 mg via ORAL
  Filled 2021-04-14 (×10): qty 1
  Filled 2021-04-14: qty 2
  Filled 2021-04-14: qty 1

## 2021-04-14 MED ORDER — SORBITOL 70 % SOLN: 30.0000 mL | Status: DC | PRN

## 2021-04-14 MED ORDER — VANCOMYCIN HCL 10 G IV SOLR
2500.0000 mg | Freq: Once | INTRAVENOUS | Status: AC
  Administered 2021-04-14: 18:00:00 2500 mg via INTRAVENOUS
  Filled 2021-04-14: qty 2500

## 2021-04-14 MED ORDER — INSULIN ASPART 100 UNIT/ML IJ SOLN
0.0000 [IU] | Freq: Three times a day (TID) | INTRAMUSCULAR | Status: DC
  Administered 2021-04-15 – 2021-04-22 (×6): 1 [IU] via SUBCUTANEOUS

## 2021-04-14 MED ORDER — SODIUM CHLORIDE 0.9 % IV SOLN
2.0000 g | INTRAVENOUS | Status: AC
  Administered 2021-04-14 – 2021-04-15 (×2): 2 g via INTRAVENOUS
  Filled 2021-04-14 (×2): qty 20

## 2021-04-14 MED ORDER — GABAPENTIN 100 MG PO CAPS
100.0000 mg | ORAL_CAPSULE | Freq: Three times a day (TID) | ORAL | Status: DC
  Administered 2021-04-14 – 2021-04-25 (×32): 100 mg via ORAL
  Filled 2021-04-14 (×33): qty 1

## 2021-04-14 NOTE — ED Notes (Signed)
Paged VT at 8:24a

## 2021-04-14 NOTE — H&P (Signed)
History and Physical    SHAHMEER BUNN WPY:[id number] DOB: [birth year] DOA: 04/14/2021  PCP: [name], NP Consultants:  Scot Dock - vascular surgery; nephrology; [place]; [phone] - urology Patient coming from:  Home - lives in a boarding house; NOK: Lucille Passy, [phone]  Chief Complaint: Foot infection  HPI: Daniel [name] is a 60 y.o. male with medical history significant of ESRD on TTS HD; DM; HTN; ICH in 2019; and PAD presenting with foot infection.  He was last admitted from 12/12-13 for PAD and had aortogram with cannulation of the R common femoral artery.  He does not have revascularization options available and so was to f/u with Dr. Scot Dock in 1-2 weeks to discuss TMA vs. BKA.  He is having worsening pain and swelling in his foot.  It has a sore on it and it isn't getting better.  He had recent procedure and told him to come back.  They did mention amputation but "we ain't gonna talk about that."    ED Course: Vasculopathy, worsened L foot infection.  Had procedure on 12/12 - likely needs future amputation.  Having worse pain, edema, wound infection.  Dr. Virl Cagey will consult.  Started on antibiotics.  Review of Systems: As per HPI; otherwise review of systems reviewed and negative.   Ambulatory Status:  Ambulates without assistance  COVID Vaccine Status:  Complete  Past Medical History:  Diagnosis Date   Anemia    Diabetes mellitus without complication [facility])    patient denies   Dialysis patient [facility])    End stage chronic kidney disease ([place])    Hypertension    ICH (intracerebral hemorrhage) ([place]) 05/20/2017   PAD (peripheral artery disease) (HCC)    Shoulder pain, left 06/28/2013    Past Surgical History:  Procedure Laterality Date   A/V FISTULAGRAM N/A 08/15/2020   Procedure: A/V FISTULAGRAM - Left Upper;  Surgeon: Cherre Robins, MD;  Location: [place] CV LAB;  Service: Cardiovascular;  Laterality: N/A;   ABDOMINAL  AORTOGRAM W/LOWER EXTREMITY N/A 04/08/2021   Procedure: ABDOMINAL AORTOGRAM W/LOWER EXTREMITY;  Surgeon: Waynetta Sandy, MD;  Location: [place] CV LAB;  Service: Cardiovascular;  Laterality: N/A;   APPENDECTOMY     AV FISTULA PLACEMENT Left 08/03/2019   Procedure: LEFT ARM ARTERIOVENOUS (AV) CEPHALIC  FISTULA CREATION;  Surgeon: Waynetta Sandy, MD;  Location: [place];  Service: Vascular;  Laterality: Left;   BIOPSY  06/30/2019   Procedure: BIOPSY;  Surgeon: Ronald Lobo, MD;  Location: [facility] ENDOSCOPY;  Service: Endoscopy;;   BIOPSY  08/02/2019   Procedure: BIOPSY;  Surgeon: Yetta Flock, MD;  Location: [facility] ENDOSCOPY;  Service: Gastroenterology;;   BIOPSY  02/08/2021   Procedure: BIOPSY;  Surgeon: Sharyn Creamer, MD;  Location: [facility] ENDOSCOPY;  Service: Gastroenterology;;   BIOPSY  02/24/2021   Procedure: BIOPSY;  Surgeon: [name], MD;  Location: [facility] ENDOSCOPY;  Service: Gastroenterology;;   BIOPSY  03/26/2021   Procedure: BIOPSY;  Surgeon: Lavena Bullion, DO;  Location: [place];  Service: Gastroenterology;;   COLONOSCOPY  01/23/2012   Procedure: COLONOSCOPY;  Surgeon: Danie Binder, MD;  [facility];  Service: Endoscopy;  Laterality: N/A;  11:10 AM   COLONOSCOPY WITH PROPOFOL N/A 06/30/2019   Procedure: COLONOSCOPY WITH PROPOFOL;  Surgeon: Ronald Lobo, MD;  Location: [place];  Service: Endoscopy;  Laterality: N/A;   ENTEROSCOPY N/A 08/02/2019   Procedure: ENTEROSCOPY;  Surgeon: Yetta Flock, MD;  Location: [facility]  ENDOSCOPY;  Service: Gastroenterology;  Laterality: N/A;   ENTEROSCOPY N/A 02/24/2021   Procedure: ENTEROSCOPY;  Surgeon: [name], MD;  Location: [facility] ENDOSCOPY;  Service: Gastroenterology;  Laterality: N/A;   ENTEROSCOPY N/A 03/26/2021   Procedure: ENTEROSCOPY;  Surgeon: Lavena Bullion, DO;  Location: [place];  Service: Gastroenterology;  Laterality: N/A;   ESOPHAGOGASTRODUODENOSCOPY N/A 08/10/2020    Procedure: ESOPHAGOGASTRODUODENOSCOPY (EGD);  Surgeon: Jerene Bears, MD;  Location: [facility] ENDOSCOPY;  Service: Gastroenterology;  Laterality: N/A;   ESOPHAGOGASTRODUODENOSCOPY (EGD) WITH PROPOFOL N/A 06/30/2019   Procedure: ESOPHAGOGASTRODUODENOSCOPY (EGD) WITH PROPOFOL;  Surgeon: Ronald Lobo, MD;  Location: [place];  Service: Endoscopy;  Laterality: N/A;   ESOPHAGOGASTRODUODENOSCOPY (EGD) WITH PROPOFOL N/A 01/12/2021   Procedure: ESOPHAGOGASTRODUODENOSCOPY (EGD) WITH PROPOFOL;  Surgeon: Sharyn Creamer, MD;  Location: [place];  Service: Gastroenterology;  Laterality: N/A;   ESOPHAGOGASTRODUODENOSCOPY (EGD) WITH PROPOFOL N/A 02/08/2021   Procedure: ESOPHAGOGASTRODUODENOSCOPY (EGD) WITH PROPOFOL;  Surgeon: Sharyn Creamer, MD;  Location: [place];  Service: Gastroenterology;  Laterality: N/A;   FISTULA SUPERFICIALIZATION Left 10/17/2019   Procedure: LEFT UPPER EXTREMITY FISTULA REVISION, SIDE BRANCH LIGATION,  AND SUPERFICIALIZATION;  Surgeon: Marty Heck, MD;  Location: [place];  Service: Vascular;  Laterality: Left;   GIVENS CAPSULE STUDY N/A 06/30/2019   Procedure: GIVENS CAPSULE STUDY;  Surgeon: Ronald Lobo, MD;  Location: [place];  Service: Endoscopy;  Laterality: N/A;   HEMOSTASIS CLIP PLACEMENT  08/10/2020   Procedure: HEMOSTASIS CLIP PLACEMENT;  Surgeon: Jerene Bears, MD;  Location: [place];  Service: Gastroenterology;;   HEMOSTASIS CLIP PLACEMENT  01/12/2021   Procedure: HEMOSTASIS CLIP PLACEMENT;  Surgeon: Sharyn Creamer, MD;  Location: [place];  Service: Gastroenterology;;   HEMOSTASIS CONTROL  08/02/2019   Procedure: HEMOSTASIS CONTROL;  Surgeon: Yetta Flock, MD;  Location: [place];  Service: Gastroenterology;;   HOT HEMOSTASIS  02/24/2021   Procedure: HOT HEMOSTASIS (ARGON PLASMA COAGULATION/BICAP);  Surgeon: [name], MD;  Location: [facility] ENDOSCOPY;  Service: Gastroenterology;;   HOT HEMOSTASIS N/A 03/26/2021   Procedure: HOT  HEMOSTASIS (ARGON PLASMA COAGULATION/BICAP);  Surgeon: Lavena Bullion, DO;  Location: [facility] ENDOSCOPY;  Service: Gastroenterology;  Laterality: N/A;   INCISION AND DRAINAGE ABSCESS N/A 06/29/2016   Procedure: INCISION AND DRAINAGE ABDOMINAL WALL ABSCESS;  Surgeon: Alphonsa Overall, MD;  Location: [facility];  Service: General;  Laterality: N/A;   INSERTION OF DIALYSIS CATHETER Right 08/03/2019   Procedure: INSERTION OF DIALYSIS CATHETER;  Surgeon: Waynetta Sandy, MD;  Location: [place];  Service: Vascular;  Laterality: Right;   INSERTION OF DIALYSIS CATHETER Right 10/22/2019   Procedure: INSERTION OF 23CM TUNNELED DIALYSIS CATHETER RIGHT INTERNAL JUGULAR;  Surgeon: Angelia Mould, MD;  Location: [place];  Service: Vascular;  Laterality: Right;   INSERTION OF DIALYSIS CATHETER Right 08/12/2020   Procedure: INSERTION OF Right internal Jugular TUNNELED  DIALYSIS CATHETER.;  Surgeon: Waynetta Sandy, MD;  Location: [place];  Service: Vascular;  Laterality: Right;   Left heel surgery     PENILE BIOPSY N/A 03/26/2020   Procedure: PENILE ULCER DEBRIDEMENT;  Surgeon: Remi Haggard, MD;  Location: [facility];  Service: Urology;  Laterality: N/A;  30 MINS   SCLEROTHERAPY  01/12/2021   Procedure: SCLEROTHERAPY;  Surgeon: Sharyn Creamer, MD;  Location: [facility] ENDOSCOPY;  Service: Gastroenterology;;   Clide Deutscher  02/24/2021   Procedure: Clide Deutscher;  Surgeon: [name], MD;  Location: [facility] ENDOSCOPY;  Service: Gastroenterology;;    Social History   Socioeconomic History  Marital status: Single    Spouse name: Not on file   Number of children: Not on file   Years of education: Not on file   Highest education level: Not on file  Occupational History   Occupation: disabled  Tobacco Use   Smoking status: Every Day    Packs/day: 0.50    Years: 45.00    Pack years: 22.50    Types: Cigarettes   Smokeless tobacco: Never  Vaping Use   Vaping Use: Never used  Substance and Sexual  Activity   Alcohol use: Not Currently    Alcohol/week: 12.0 standard drinks    Types: 12 Cans of beer per week   Drug use: Not Currently    Types: "Crack" cocaine    Comment: last in 2020   Sexual activity: Yes    Birth control/protection: None  Other Topics Concern   Not on file  Social History Narrative   Not on file   Social Determinants of Health   Financial Resource Strain: Not on file  Food Insecurity: Not on file  Transportation Needs: Not on file  Physical Activity: Not on file  Stress: Not on file  Social Connections: Not on file  Intimate Partner Violence: Not on file    Allergies  Allergen Reactions   Dilaudid [Hydromorphone Hcl] Itching and Other (See Comments)    Pt reports itchiness after IM injection     Family History  Problem Relation Age of Onset   Colon cancer Neg Hx     Prior to Admission medications   Medication Sig Start Date End Date Taking? Authorizing Provider  amLODipine (NORVASC) 10 MG tablet Take 1 tablet (10 mg total) by mouth at bedtime. 03/23/21   [name], NP  blood glucose meter kit and supplies KIT Dispense based on patient and insurance preference. Use up to four times daily as directed. (FOR ICD-9 250.00, 250.01). 07/01/16   Nita Sells, MD  calcium acetate (PHOSLO) 667 MG capsule Take 1,334 mg by mouth 3 (three) times daily with meals. 03/14/21   [provider]  cloNIDine (CATAPRES) 0.1 MG tablet Take 0.1 mg by mouth as directed. TAKE 1 TABLET BID BUT ON DIALYSIS DAYS TAKE ONLY AT BEDTIME. 03/13/21   [provider]  gabapentin (NEURONTIN) 100 MG capsule Take 1 capsule (100 mg total) by mouth 3 (three) times daily. 04/09/21   Waynetta Sandy, MD  HYDROcodone-acetaminophen (NORCO/VICODIN) 5-325 MG tablet Take 1 tablet by mouth every 6 (six) hours as needed for moderate pain. 04/09/21   Waynetta Sandy, MD  hydrOXYzine (ATARAX/VISTARIL) 25 MG tablet TAKE 1 TABLET BY MOUTH EVERY 8  HOURS AS NEEDED FOR ANXIETY OR  ITCHING Patient taking differently: Take 25 mg by mouth in the morning and at bedtime. 11/07/20   [name], NP  lidocaine-prilocaine (EMLA) cream Apply 1 application topically once. 12/27/20   [provider]  pantoprazole (PROTONIX) 40 MG tablet Take 1 tablet (40 mg total) by mouth 2 (two) times daily. 02/28/21   Charlynne Cousins, MD  QUEtiapine (SEROQUEL) 50 MG tablet Take 1 tablet (50 mg total) by mouth at bedtime. 03/27/21 04/26/21  Arrien, Jimmy Picket, MD  colchicine 0.6 MG tablet Take 0.5 tablets (0.3 mg total) by mouth 2 (two) times daily. 07/24/20 07/29/20  Noemi Chapel, MD  ferrous sulfate 325 (65 FE) MG tablet SMARTSIG:1 Tablet(s) By Mouth Twice Daily 08/11/19 02/09/20  [provider]  furosemide (LASIX) 40 MG tablet Take 1 tablet (40 mg total)  by mouth daily. 07/25/19 02/09/20  Ladona Horns, MD    Physical Exam: Vitals:   04/14/21 0736 04/14/21 1114  BP: 137/76 139/85  Pulse: 94 88  Resp: 20 16  Temp: 98.7 F (37.1 C) 98 F (36.7 C)  TempSrc: Oral Oral  SpO2: 98% 99%     General:  Appears calm and comfortable and is in NAD Eyes:  PERRL, EOMI, normal lids, iris ENT:  grossly normal hearing, lips & tongue, mmm; poor dentition Neck:  no LAD, masses or thyromegaly Cardiovascular:  RRR, no m/r/g.  Respiratory:   CTA bilaterally with no wheezes/rales/rhonchi.  Normal respiratory effort. Abdomen:  soft, NT, ND Skin:  deep ulcer at base of dorsal 4/5th left toe; no obvious surrounding erythema but there is edema of the LLE    Musculoskeletal:  grossly normal tone BUE/BLE, good ROM, no bony abnormality Psychiatric:  grossly normal mood and affect, speech fluent and appropriate, AOx3 Neurologic:  CN 2-12 grossly intact, moves all extremities in coordinated fashion    Radiological Exams on Admission: Independently reviewed - see discussion in A/P where applicable  DG Foot Complete Left  Result Date:  04/14/2021 CLINICAL DATA:  Infection. Pain in bilateral feet. Wound on the top of the left foot. EXAM: LEFT FOOT - COMPLETE 3+ VIEW COMPARISON:  None. FINDINGS: Fractures are seen through the bases of the third, fourth, and fifth proximal phalanges. No evidence of osteomyelitis. No other abnormalities. IMPRESSION: There are fractures through the bases of the third, fourth, and fifth proximal phalanges, unchanged since March 30, 2021. No osteomyelitis identified. Electronically Signed   By: Dorise Bullion III M.D.   On: 04/14/2021 10:03   VAS [place] LOWER EXTREMITY VENOUS (DVT) (7a-7p)  Result Date: 04/14/2021  Lower Venous DVT Study Patient Name:  CHIKE FARRINGTON  Date of Exam:   04/14/2021 Medical Rec #: [id number]           Accession #:    [id number] Date of Birth: [birth year]           Patient Gender: M Patient Age:   77 years Exam [facility]  [facility] Procedure:      VAS [place] LOWER EXTREMITY VENOUS (DVT) Referring Phys: Ova Freshwater FONDAW --------------------------------------------------------------------------------  Indications: Edema. Other Indications: Non healing wound left foot. Risk Factors: Patient with rest pain and no options to improve flow to the left foot. Limitations: Involuntary movement secondary to rest pain in left foot. Comparison Study: Prior negative bilateral LEVs done 03/19/21 and 03/25/21 Performing Technologist: Sharion Dove RVS  Examination Guidelines: A complete evaluation includes B-mode imaging, spectral Doppler, color Doppler, and power Doppler as needed of all accessible portions of each vessel. Bilateral testing is considered an integral part of a complete examination. Limited examinations for reoccurring indications may be performed as noted. The reflux portion of the exam is performed with the patient in reverse Trendelenburg.  +-----+---------------+---------+-----------+----------+--------------+  RIGHT Compressibility Phasicity Spontaneity Properties Thrombus  Aging  +-----+---------------+---------+-----------+----------+--------------+  CFV   Full            Yes       Yes                                    +-----+---------------+---------+-----------+----------+--------------+   +---------+---------------+---------+-----------+----------+--------------+  LEFT      Compressibility Phasicity Spontaneity Properties Thrombus Aging  +---------+---------------+---------+-----------+----------+--------------+  CFV       Full  Yes       Yes                                    +---------+---------------+---------+-----------+----------+--------------+  SFJ       Full                                                             +---------+---------------+---------+-----------+----------+--------------+  FV Prox   Full                                                             +---------+---------------+---------+-----------+----------+--------------+  FV Mid    Full                                                             +---------+---------------+---------+-----------+----------+--------------+  FV Distal Full                                                             +---------+---------------+---------+-----------+----------+--------------+  PFV       Full                                                             +---------+---------------+---------+-----------+----------+--------------+  POP       Full            Yes       Yes                                    +---------+---------------+---------+-----------+----------+--------------+  PTV       Full            Yes       Yes                                    +---------+---------------+---------+-----------+----------+--------------+    Summary: RIGHT: - No evidence of common femoral vein obstruction.  LEFT: - There is no evidence of deep vein thrombosis in the lower extremity.  Interstitial edema noted throughout the left calf.  *See table(s) above for measurements and observations.    Preliminary     EKG:   pending   Labs on Admission: I have personally reviewed the available labs and imaging studies at the time of the admission.  Pertinent labs:   Glucose 136 BUN 22/Creatinine 8.65/GFR 6  WBC 6.2 Hgb 8.9   Assessment/Plan Principal Problem:   Diabetic foot infection (HCC) Active Problems:   Essential hypertension   End-stage renal disease on hemodialysis (HCC)   Diabetes mellitus type 2, controlled, with complications (HCC)   Class 2 obesity due to excess calories with body mass index (BMI) of 39.0 to 39.9 in adult   PAD (peripheral artery disease) (HCC)   LE Wound Infection -Prior foot xray on 12/3 without apparent osteomyelitis; also none noted today (although he does have fractures along the bases of the 3/4/5 phalanges -Amputation was mentioned as a consideration during his prior hospitalization -Foot ulcer is non-healing and draining and now the patient has developed surrounding edema -[place] in ER negative for DVT -Negative lactate, no current concerns for sepsis -Will treat with IV antibiotics (Rocephin, Flagyl, and Vancomycin as per the lower extremity ulcer algorithm -Dr. Virl Cagey has been consulted -Based on refractory ulcer infection with known vascular compromise, it seems that the patient may be heading for amputation - TMA vs. BKA -LE wound order set utilized including labs (CRP, ESR, A1c, prealbumin, HIV, and blood cultures) and consults (peripheral vascular navigator; TOC team; wound care; and nutrition)  -He is likely to need SNF placement post-amputation  PAD -Patient with aortogram last week, no apparent revascularization targets -Vascular surgery consulted, as above  ESRD on HD -Patient on chronic TTS HD -Nephrology prn order set utilized -He does not appear to be volume overloaded or otherwise in need of acute HD -Nephrology notified that patient will need HD  -Continue Phoslo  DM -Recent A1c <6, not currently on meds -Cover with very sensitive-scale SSI   -Continue Neurontin  HTN -Continue Norvasc, Clonidine  Mood d/o -Continue Seroquel qhs  Obesity -BMI 39 -Weight loss should be encouraged -Outpatient PCP/bariatric medicine f/u encouraged     Note: This patient has been tested and is negative for the novel coronavirus COVID-19. The patient has been fully vaccinated against COVID-19.   Level of care: Med-Surg DVT prophylaxis: Heparin Code Status:  Full - confirmed with patient Family Communication: None present; patient did not request that I contact NOK at the time of admission Disposition Plan:  The patient is from: home (boarding house)  Anticipated d/c is to: SNF rehab  Anticipated d/c date will depend on clinical response to treatment, but possibly as early as tomorrow if she has excellent response to treatment  Patient is currently: acutely ill Consults called: Vascular surgery; peripheral vascular navigator; TOC team; wound care; and nutrition  Admission status:  Admit - It is my clinical opinion that admission to INPATIENT is reasonable and necessary because of the expectation that this patient will require hospital care that crosses at least 2 midnights to treat this condition based on the medical complexity of the problems presented.  Given the aforementioned information, the predictability of an adverse outcome is felt to be significant.    Karmen Bongo MD [facility]   How to contact the [facility] Attending or Consulting provider [place] or covering provider during after hours [place], for this patient?  Check the care team in [facility] and look for a) attending/consulting [facility] provider listed and b) the [facility] team listed Log into [email or web address] and use [place]'s universal password to access. If you do not have the password, please contact the hospital operator. Locate the [facility] provider you are looking for under [facility] and page to a number that you can be directly reached. If you still have difficulty  reaching  the provider, please page the [facility] (Director on Call) for the Hospitalists listed on amion for assistance.   04/14/2021, 12:04 PM

## 2021-04-14 NOTE — Progress Notes (Signed)
VASCULAR LAB    Left lower extremity venous duplex has been performed.  See CV proc for preliminary results.  Messaged results to Franchot Heidelberg, PA-C via secure chat  Kinzee Happel, RVT 04/14/2021, 9:01 AM

## 2021-04-14 NOTE — ED Notes (Signed)
Metronidazole is infusing, will start vancomycin after it's completed.

## 2021-04-14 NOTE — ED Provider Notes (Signed)
[facility] EMERGENCY DEPARTMENT Provider Note   CSN: [id number] Arrival date & time: 04/14/21  8185     History Chief Complaint  Patient presents with   Foot Pain    Daniel [name] is a 60 y.o. male presenting for evaluation of worsening left foot pain.  Patient states for the past week, he has had gradually worsening left foot pain.  He also reports increased swelling and increased size and drainage of the wound of his left foot.  He recently had a vascular procedure.  He has not been taking anything for pain including Tylenol or ibuprofen.  He denies any fall, trauma, or injury.  He has chronic pain, but states that it has gotten worse.  He denies fevers, chills, nausea, vomiting, confusion.  He also reports dysuria, but no hematuria.  He makes urine 1-2 times a day.  He is a dialysis patient, goes Tuesday, Thursday, Saturday.  Last session was yesterday, was normal for him.  He does not feel fluid overloaded  Additional history obtained from chart review. H/o anemia, DM, ESRD on dialysis, ICH, HTN.  Reviewed recent vascular note.  Patient had endovascular procedure on 12/12 in which stenosis and poor flow of the left foot was noted.  However there is no immediate intervene able lesion.  Per note, patient will likely need either a transmetatarsal amputation or BKA.  HPI     Past Medical History:  Diagnosis Date   Anemia    Diabetes mellitus without complication [facility])    patient denies   Dialysis patient [facility])    End stage chronic kidney disease ([place])    Hypertension    ICH (intracerebral hemorrhage) ([place]) 05/20/2017   Shoulder pain, left 06/28/2013    Patient Active Problem List   Diagnosis Date Noted   PAD (peripheral artery disease) ([place]) 04/08/2021   Gastritis and gastroduodenitis    End-stage renal disease on hemodialysis ([place]) 03/25/2021   Diabetes mellitus type 2, controlled, with complications ([place]) 63/14/9702   QT prolongation 03/25/2021    Alcohol abuse with intoxication ([place]) 03/25/2021   Cocaine abuse ([place]) 03/25/2021   Nicotine abuse 03/25/2021   Morbidly obese ([place]) 03/25/2021   AVM (arteriovenous malformation) of duodenum, acquired    Peptic ulcer disease    Upper GI bleed 03/24/2021   Chalazion of right upper eyelid 03/24/2021   Thrombocytopenia ([place]) 03/24/2021   Visual hallucination 03/24/2021   Bilateral leg edema 03/24/2021   Acute blood loss anemia    Benign neoplasm of duodenum, jejunum, and ileum    Anemia due to chronic kidney disease 02/23/2021   Prolonged QT interval 02/23/2021   Rotator cuff tear arthropathy of right shoulder 01/23/2021   Alcohol dependence ([place]) 01/13/2021   Gastric ulcer with hemorrhage 01/13/2021   Generalized weakness    Transaminitis 01/10/2021   History of alcohol abuse 10/27/2020   History of cocaine abuse ([place]) 10/27/2020   Nicotine dependence, cigarettes, uncomplicated 63/78/5885   Acute GI bleeding 08/10/2020   Gastrointestinal hemorrhage with melena    Duodenal ulcer with hemorrhage    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   ESRD on hemodialysis ([place])    AVM (arteriovenous malformation) of small bowel, acquired with hemorrhage    Symptomatic anemia 07/31/2019   Acute on chronic anemia 07/23/2019   Chronic hepatitis C without hepatic coma ([place]) 07/11/2019   Iron deficiency anemia 03/30/2019   Alcohol use disorder 03/30/2019   B12 deficiency 03/30/2019   Orthostatic hypotension 01/22/2019  hypertension 06/29/2016  ° DM2 (diabetes mellitus, type 2) (H[facility]) 06/29/2016  ° ° °Past Surgical History:  °Procedure Laterality Date  ° A/V FISTULAGRAM N/A 08/15/2020  ° Procedure: A/V FISTULAGRAM - Left Upper;  Surgeon: Hawken, Thomas N, MD;  [facility] INV[facility] CV LAB;  Service: Cardiovascular;  Laterality: N/A;  ° ABDOMINAL AORTOGRAM W/LOWER EXTREMITY N/A 04/08/2021  ° Procedure: ABDOMINAL AORTOGRAM W/LOWER EXTREMITY;  Surgeon: Cain, Brandon Christopher, MD;   [facility]: [facility] INVAS[facility] CV LAB;  Service: Cardiovascular;  Laterality: N/A;  ° APPENDECTOMY    ° AV FISTULA PLACEMENT Left 08/03/2019  ° Procedure: LEFT ARM ARTERIOVENOUS (AV) CEPHALIC  FISTULA CREATION;  Surgeon: Cain, Brandon Christopher, MD;  Location: [facility] OR;  Service: Vascular;  Laterality: Left;  ° BIOPSY  06/30/2019  ° Procedure: BIOPSY;  Surgeon: Buccini, Robert, MD;  Location: [facility] ENDOSCOPY;  Service: Endoscopy;;  ° BIOPSY  08/02/2019  ° Procedure: BIOPSY;  Surgeon: Armbruster, Steven P, MD;  [facility] ENDOSCOPY;  Service: Gastroenterology;;  ° BIOPSY  02/08/2021  ° Procedure: BIOPSY;  Surgeon: Dorsey, Ying C, MD;  [facility] ENDOS[facility];  Service: Gastroenterology;;  ° BIOPSY  02/24/2021  ° Procedure: BIOPSY;  Surgeon: Cunningham, Scott E, MD;  [facility] END[facility];  Service: Gastroenterology;;  ° BIOPSY  03/26/2021  ° Procedure: BIOPSY;  Surgeon: Cirigliano, Vito V, DO;  [facility] END[facility];  Service: Gastroenterology;;  ° COLONOSCOPY  01/23/2012  ° Procedure: COLONOSCOPY;  Surgeon: Sandi L Fields, MD;  [facility];  Service: Endoscopy;  Laterality: N/A;  11:10 AM  ° COLONOSCOPY WITH PROPOFOL N/A 06/30/2019  ° Procedure: COLONOSCOPY WITH PROPOFOL;  Surgeon: Buccini, Robert, MD;  [facility];  Service: Endoscopy;  Laterality: N/A;  ° ENTEROSCOPY N/A 08/02/2019  ° Procedure: ENTEROSCOPY;  Surgeon: Armbruster, Steven P, MD;  [facility] END[facility];  Service: Gastroenterology;  Laterality: N/A;  ° ENTEROSCOPY N/A 02/24/2021  ° Procedure: ENTEROSCOPY;  Surgeon: Cunningham, Scott E, MD;  [facility];  Service: Gastroenterology;  Laterality: N/A;  ° ENTEROSCOPY N/A 03/26/2021  ° Procedure: ENTEROSCOPY;  Surgeon: Cirigliano, Vito V, DO;  [facility];  Service: Gastroenterology;  Laterality: N/A;  ° ESOPHAGOGASTRODUODENOSCOPY N/A 08/10/2020  ° Procedure: ESOPHAGOGASTRODUODENOSCOPY (EGD);  Surgeon: Pyrtle, Jay M, MD;  [facility];  Service: Gastroenterology;   Laterality: N/A;  ° ESOPHAGOGASTRODUODENOSCOPY (EGD) WITH PROPOFOL N/A 06/30/2019  ° Procedure: ESOPHAGOGASTRODUODENOSCOPY (EGD) WITH PROPOFOL;  Surgeon: Buccini, Robert, MD;  [facility];  Service: Endoscopy;  Laterality: N/A;  ° ESOPHAGOGASTRODUODENOSCOPY (EGD) WITH PROPOFOL N/A 01/12/2021  ° Procedure: ESOPHAGOGASTRODUODENOSCOPY (EGD) WITH PROPOFOL;  Surgeon: Dorsey, Ying C, MD;  [facility];  Service: Gastroenterology;  Laterality: N/A;  ° ESOPHAGOGASTRODUODENOSCOPY (EGD) WITH PROPOFOL N/A 02/08/2021  ° Procedure: ESOPHAGOGASTRODUODENOSCOPY (EGD) WITH PROPOFOL;  Surgeon: Dorsey, Ying C, MD;  [facility];  Service: Gastroenterology;  Laterality: N/A;  ° FISTULA SUPERFICIALIZATION Left 10/17/2019  ° Procedure: LEFT UPPER EXTREMITY FISTULA REVISION, SIDE BRANCH LIGATION,  AND SUPERFICIALIZATION;  Surgeon: [name], Christopher J, MD;  [facility];  Service: Vascular;  Laterality: Left;  ° GIVENS CAPSULE STUDY N/A 06/30/2019  ° Procedure: GIVENS CAPSULE STUDY;  Surgeon: Buccini, Robert, MD;  [facility];  Service: Endoscopy;  Laterality: N/A;  ° HEMOSTASIS CLIP PLACEMENT  08/10/2020  ° Procedure: HEMOSTASIS CLIP PLACEMENT;  Surgeon: Pyrtle, Jay M, MD;  [facility];  Service: Gastroenterology;;  ° HEMOSTASIS CLIP PLACEMENT  01/12/2021  ° Procedure: HEMOSTASIS CLIP PLACEMENT;  Surgeon: Dorsey, Ying C, MD;  [facility];    Service: Gastroenterology;;   HEMOSTASIS CONTROL  08/02/2019   Procedure: HEMOSTASIS CONTROL;  Surgeon: Yetta Flock, MD;  Location: [place];  Service: Gastroenterology;;   HOT HEMOSTASIS  02/24/2021   Procedure: HOT HEMOSTASIS (ARGON PLASMA COAGULATION/BICAP);  Surgeon: [name], MD;  Location[facility]COPY;  Service: Gastroenterology;;   HOT HEMOSTASIS N/A 03/26/2021   Procedure: HOT HEMOSTASIS (ARGON PLASMA COAGULATION/BICAP);  Surgeon: Lavena Bullion, DO;  Location: [facility] ENDOSCOPY;  Service: Gastroenterology;   Laterality: N/A;   INCISION AND DRAINAGE ABSCESS N/A 06/29/2016   Procedure: INCISION AND DRAINAGE ABDOMINAL WALL ABSCESS;  Surgeon: Alphonsa Overall, MD;  Location: [facility] ORS;  Service: General;  Laterality: N/A;   INSERTION OF DIALYSIS CATHETER Right 08/03/2019   Procedure: INSERTION OF DIALYSIS CATHETER;  Surgeon: Waynetta Sandy, MD;  Location: [place];  Service: Vascular;  Laterality: Right;   INSERTION OF DIALYSIS CATHETER Right 10/22/2019   Procedure: INSERTION OF 23CM TUNNELED DIALYSIS CATHETER RIGHT INTERNAL JUGULAR;  Surgeon: Angelia Mould, MD;  Location: [place];  Service: Vascular;  Laterality: Right;   INSERTION OF DIALYSIS CATHETER Right 08/12/2020   Procedure: INSERTION OF Right internal Jugular TUNNELED  DIALYSIS CATHETER.;  Surgeon: Waynetta Sandy, MD;  Location: [place];  Service: Vascular;  Laterality: Right;   Left heel surgery     PENILE BIOPSY N/A 03/26/2020   Procedure: PENILE ULCER DEBRIDEMENT;  Surgeon: Remi Haggard, MD;  Location: [facility] ORS;  Service: Urology;  Laterality: N/A;  30 MINS   SCLEROTHERAPY  01/12/2021   Procedure: SCLEROTHERAPY;  Surgeon: Sharyn Creamer, MD;  Location: [facility] ENDOSCOPY;  Service: Gastroenterology;;   Clide Deutscher  02/24/2021   Procedure: Clide Deutscher;  Surgeon: [name], MD;  Location: [facility] ENDOSCOPY;  Service: Gastroenterology;;       Family History  Problem Relation Age of Onset   Colon cancer Neg Hx     Social History   Tobacco Use   Smoking status: Every Day    Packs/day: 0.50    Years: 30.00    Pack years: 15.00    Types: Cigarettes   Smokeless tobacco: Never  Vaping Use   Vaping Use: Never used  Substance Use Topics   Alcohol use: Not Currently    Alcohol/week: 12.0 standard drinks    Types: 12 Cans of beer per week   Drug use: Not Currently    Types: "Crack" cocaine    Comment: last in 2020    Home Medications Prior to Admission medications   Medication Sig Start Date End Date Taking?  Authorizing Provider  amLODipine (NORVASC) 10 MG tablet Take 1 tablet (10 mg total) by mouth at bedtime. 03/23/21   [name], NP  blood glucose meter kit and supplies KIT Dispense based on patient and insurance preference. Use up to four times daily as directed. (FOR ICD-9 250.00, 250.01). 07/01/16   Nita Sells, MD  calcium acetate (PHOSLO) 667 MG capsule Take 1,334 mg by mouth 3 (three) times daily with meals. 03/14/21   [provider]  cloNIDine (CATAPRES) 0.1 MG tablet Take 0.1 mg by mouth as directed. TAKE 1 TABLET BID BUT ON DIALYSIS DAYS TAKE ONLY AT BEDTIME. 03/13/21   [provider]  gabapentin (NEURONTIN) 100 MG capsule Take 1 capsule (100 mg total) by mouth 3 (three) times daily. 04/09/21   Waynetta Sandy, MD  HYDROcodone-acetaminophen (NORCO/VICODIN) 5-325 MG tablet Take 1 tablet by mouth every 6 (six) hours as needed for moderate pain. 04/09/21   Servando Snare  Christopher, MD  °hydrOXYzine (ATARAX/VISTARIL) 25 MG tablet TAKE 1 TABLET BY MOUTH EVERY 8 HOURS AS NEEDED FOR ANXIETY OR  ITCHING °Patient taking differently: Take 25 mg by mouth in the morning and at bedtime. 11/07/20   Edwards, Michelle P, NP  °lidocaine-prilocaine (EMLA) cream Apply 1 application topically once. 12/27/20   [provider]  °pantoprazole (PROTONIX) 40 MG tablet Take 1 tablet (40 mg total) by mouth 2 (two) times daily. 02/28/21   Feliz Ortiz, Abraham, MD  °QUEtiapine (SEROQUEL) 50 MG tablet Take 1 tablet (50 mg total) by mouth at bedtime. 03/27/21 04/26/21  Arrien, Mauricio Daniel, MD  °colchicine 0.6 MG tablet Take 0.5 tablets (0.3 mg total) by mouth 2 (two) times daily. 07/24/20 07/29/20  Miller, Brian, MD  °ferrous sulfate 325 (65 FE) MG tablet SMARTSIG:1 Tablet(s) By Mouth Twice Daily 08/11/19 02/09/20  [provider]  °furosemide (LASIX) 40 MG tablet Take 1 tablet (40 mg total) by mouth daily. 07/25/19 02/09/20  Jones, Ellen, MD  ° ° °Allergies    °Dilaudid  [hydromorphone hcl] ° °Review of Systems   °Review of Systems  °Cardiovascular:  Positive for leg swelling.  °Genitourinary:  Positive for dysuria.  °Musculoskeletal:  Positive for arthralgias.  °Skin:  Positive for wound.  °All other systems reviewed and are negative. ° °Physical Exam °Updated Vital Signs °BP 137/76 (BP Location: Right Arm)    Pulse 94    Temp 98.7 °F (37.1 °C) (Oral)    Resp 20    SpO2 98%  ° °Physical Exam °Vitals and nursing note reviewed.  °Constitutional:   °   General: He is not in acute distress. °   Appearance: Normal appearance.  °HENT:  °   Head: Normocephalic and atraumatic.  °Eyes:  °   Conjunctiva/sclera: Conjunctivae normal.  °   Pupils: Pupils are equal, round, and reactive to light.  °Cardiovascular:  °   Rate and Rhythm: Normal rate and regular rhythm.  °   Pulses: Normal pulses.  °Pulmonary:  °   Effort: Pulmonary effort is normal. No respiratory distress.  °   Breath sounds: Normal breath sounds. No wheezing.  °   Comments: Speaking in full sentences.  Clear lung sounds in all fields. °Abdominal:  °   General: There is no distension.  °   Palpations: Abdomen is soft.  °   Tenderness: There is no abdominal tenderness.  °Musculoskeletal:     °   General: Normal range of motion.  °   Cervical back: Normal range of motion and neck supple.  °   Comments: Left foot swollen when compared to the right.  Dime sized wound of the left foot at the base of the pinky with minimal purulence.  Diffuse erythema, tenderness, and warmth of the left foot.  When compared to pictures from last week, swelling is worse in wound is larger/with more drainage.   °Skin: °   General: Skin is warm and dry.  °   Capillary Refill: Capillary refill takes less than 2 seconds.  °Neurological:  °   Mental Status: He is alert and oriented to person, place, and time.  °Psychiatric:     °   Mood and Affect: Mood and affect normal.     °   Speech: Speech normal.     °   Behavior: Behavior normal.  ° ° °ED Results /  Procedures / Treatments   °Labs °(all labs ordered are listed, but only abnormal results are displayed) °Labs Reviewed  °  CBC WITH DIFFERENTIAL/PLATELET - Abnormal; Notable for the following components:      Result Value   RBC 2.66 (*)    Hemoglobin 8.9 (*)    HCT 28.9 (*)    MCV 108.6 (*)    RDW 20.1 (*)    nRBC 2.3 (*)    Abs Immature Granulocytes 0.10 (*)    All other components within normal limits  BASIC METABOLIC PANEL - Abnormal; Notable for the following components:   Chloride 96 (*)    Glucose, Bld 136 (*)    Creatinine, Ser 8.65 (*)    GFR, Estimated 6 (*)    All other components within normal limits  CULTURE, BLOOD (ROUTINE X 2)  CULTURE, BLOOD (ROUTINE X 2)  RESP PANEL BY RT-PCR (FLU A&B, COVID) [id number]  LACTIC ACID, PLASMA  LACTIC ACID, PLASMA  URINALYSIS, ROUTINE W REFLEX MICROSCOPIC    EKG None  Radiology DG Foot Complete Left  Result Date: 04/14/2021 CLINICAL DATA:  Infection. Pain in bilateral feet. Wound on the top of the left foot. EXAM: LEFT FOOT - COMPLETE 3+ VIEW COMPARISON:  None. FINDINGS: Fractures are seen through the bases of the third, fourth, and fifth proximal phalanges. No evidence of osteomyelitis. No other abnormalities. IMPRESSION: There are fractures through the bases of the third, fourth, and fifth proximal phalanges, unchanged since March 30, 2021. No osteomyelitis identified. Electronically Signed   By: Dorise Bullion III M.D.   On: 04/14/2021 10:03   VAS [place] LOWER EXTREMITY VENOUS (DVT) (7a-7p)  Result Date: 04/14/2021  Lower Venous DVT Study Patient Name:  Daniel [name]  Date of Exam:   04/14/2021 Medical Rec #: [id number]           Accession #:    [id number] Date of Birth: [birth year]           Patient Gender: M Patient Age:   74 years Exam Location:  [facility] Procedure:      VAS [place] LOWER EXTREMITY VENOUS (DVT) Referring Phys: Ova Freshwater FONDAW --------------------------------------------------------------------------------   Indications: Edema. Other Indications: Non healing wound left foot. Risk Factors: Patient with rest pain and no options to improve flow to the left foot. Limitations: Involuntary movement secondary to rest pain in left foot. Comparison Study: Prior negative bilateral LEVs done 03/19/21 and 03/25/21 Performing Technologist: Sharion Dove RVS  Examination Guidelines: A complete evaluation includes B-mode imaging, spectral Doppler, color Doppler, and power Doppler as needed of all accessible portions of each vessel. Bilateral testing is considered an integral part of a complete examination. Limited examinations for reoccurring indications may be performed as noted. The reflux portion of the exam is performed with the patient in reverse Trendelenburg.  +-----+---------------+---------+-----------+----------+--------------+  RIGHT Compressibility Phasicity Spontaneity Properties Thrombus Aging  +-----+---------------+---------+-----------+----------+--------------+  CFV   Full            Yes       Yes                                    +-----+---------------+---------+-----------+----------+--------------+   +---------+---------------+---------+-----------+----------+--------------+  LEFT      Compressibility Phasicity Spontaneity Properties Thrombus Aging  +---------+---------------+---------+-----------+----------+--------------+  CFV       Full            Yes       Yes                                    +---------+---------------+---------+-----------+----------+--------------+  SFJ       Full                                                             +---------+---------------+---------+-----------+----------+--------------+  FV Prox   Full                                                             +---------+---------------+---------+-----------+----------+--------------+  FV Mid    Full                                                              +---------+---------------+---------+-----------+----------+--------------+  FV Distal Full                                                             +---------+---------------+---------+-----------+----------+--------------+  PFV       Full                                                             +---------+---------------+---------+-----------+----------+--------------+  POP       Full            Yes       Yes                                    +---------+---------------+---------+-----------+----------+--------------+  PTV       Full            Yes       Yes                                    +---------+---------------+---------+-----------+----------+--------------+    Summary: RIGHT: - No evidence of common femoral vein obstruction.  LEFT: - There is no evidence of deep vein thrombosis in the lower extremity.  Interstitial edema noted throughout the left calf.  *See table(s) above for measurements and observations.    Preliminary    ° °Procedures °Procedures  ° °Medications Ordered in ED °Medications  °piperacillin-tazobactam (ZOSYN) IVPB 2.25 g (has no administration in time range)  °oxyCODONE-acetaminophen (PERCOCET/ROXICET) 5-325 MG per tablet 1 tablet (1 tablet Oral Given 04/14/21 0834)  ° ° °ED Course  °I have reviewed the triage vital signs and the nursing notes. ° °Pertinent labs & imaging results that were available during my care of the patient were reviewed by me and considered in my medical decision making (see   chart for details).    MDM Rules/Calculators/A&P                          Patient presenting for evaluation of worsening left foot pain.  On exam, patient appears nontoxic.  Vital signs are reassuring, he is not septic.  However he does have swelling of his left foot, worse when compared to previous pictures.  He also has an enlarging wound with worsening drainage.  Consider DVT.  Consider infection.  Consider neuropathy as patient does have baseline bilateral foot pain.  Will  obtain labs, ultrasound, x-ray.  Ultrasound negative for DVT.  X-ray negative for osteomyelitis.  Labs overall reassuring, no leukocytosis.  Patient is not septic.  IV abx started. Will consult with vascular and plan for medicine admit.  Discussed with Dr. Unk Lightning from vascular surgery who will evaluate the patient.  Discussed with Dr. Lorin Mercy from [facility] hospitalist service, patient to be admitted.     Final Clinical Impression(s) / ED Diagnoses Final diagnoses:  Foot infection  PVD (peripheral vascular disease) [facility])    Rx / DC Orders ED Discharge Orders     None        Franchot Heidelberg, PA-C 04/14/21 1014    Long, Wonda Olds, MD 04/17/21 [phone]

## 2021-04-14 NOTE — ED Notes (Signed)
VT called back and is aware.

## 2021-04-14 NOTE — Progress Notes (Signed)
Pharmacy Antibiotic Note  Daniel [name] is a 60 y.o. male admitted on 04/14/2021 with  lower extremity wound infection - high risk for MRSA .  Pharmacy has been consulted for vancomycin dosing.  Patient with a history of ESRD on TTS HD; DM; HTN; ICH in 2019; and PAD. Patient presenting with foot infection.  Admitted from 12/12-13 for PAD w/ aortogram and cannulation of the right common femoral artery at that time.  WBC 6.2; LA 1.6; afeb  Plan: Vancomycin 2500mg  given in ED would plan for 1000mg  post-HD doses. Nephrology has been notified no note in place yet. Metronidazole per MD Ceftriaxone per MD Trend WBC, Fever, Renal function, & Clinical course F/u cultures, clinical course, WBC, fever De-escalate when able F/u Nephrology plan & vascular rec's     Temp (24hrs), Avg:98.7 F (37.1 C), Min:98.7 F (37.1 C), Max:98.7 F (37.1 C)  Recent Labs  Lab 04/08/21 0724 04/09/21 0415 04/09/21 0930 04/14/21 0804  WBC  --  5.1  --  6.2  CREATININE 12.80*  --  12.93* 8.65*    Estimated Creatinine Clearance: 12.2 mL/min (A) (by C-G formula based on SCr of 8.65 mg/dL (H)).    Allergies  Allergen Reactions   Dilaudid [Hydromorphone Hcl] Itching and Other (See Comments)    Pt reports itchiness after IM injection    Antimicrobials this admission: ceftriaxone 12/18 >>  metronidazole 12/18 >>  vancomycin 12/18 >>   Microbiology results: Pending  Thank you for allowing pharmacy to be a part of this patients care.  Lorelei Pont, PharmD, BCPS 04/14/2021 9:36 AM ED Clinical Pharmacist -  [phone]

## 2021-04-14 NOTE — ED Provider Notes (Addendum)
Emergency Medicine Provider Triage Evaluation Note  Daniel [name] , a 60 y.o. male  was evaluated in triage.  Pt complains of left foot pain. Seems that his symptoms are somewhat chronic in nature.  He states that he had a procedure done by his vascular surgeon 12/12 and has not had any pain medicine since then.  A states that his left foot is painful achy and uncomfortable.  Review of Systems  Positive: Left foot pain Negative: Fever  Physical Exam  BP 137/76 (BP Location: Right Arm)    Pulse 94    Temp 98.7 F (37.1 C) (Oral)    Resp 20    SpO2 98%  Gen:   Awake, no distress  Resp:  Normal effort  MSK:   Moves extremities without difficulty  Other:  Left foot with ulceration. No surrounding erythema.  Unable to palpate pulses however patient has bilateral biphasic Doppler-confirmed DP and PT pulses Left foot slightly swollen.    Medical Decision Making  Medically screening exam initiated at 8:03 AM.  Appropriate orders placed.  Daniel [name] was informed that the remainder of the evaluation will be completed by another provider, this initial triage assessment does not replace that evaluation, and the importance of remaining in the ED until their evaluation is complete.  Obtain LLE DVT study and labs. W recent surgery and some left foot swelling will obtain DVT study. Has dopplerable flow. Worth considering that pain may be post-op due to no pain medicine.    Tedd Sias, [place] 04/14/21 0805    Tedd Sias, PA 04/14/21 6962    Pattricia Boss, MD 04/15/21 [phone]

## 2021-04-14 NOTE — ED Notes (Signed)
Pt to vascular lab for leg [place]

## 2021-04-14 NOTE — Consult Note (Signed)
[place] Nurse Consult Note: Reason for [place] is simultaneously consulted with vascular surgery. Patient with known PAD and nonhealing wound.  Dr. Virl Cagey has seen today and  Wound type: arterial insufficiency Pressure Injury POA: Yes/No/NA Measurement: 2cm x 1.6cm with depth unable to be determined due to the presence of nonviable tissue. Wound bed: dry, red with nonviable tissue core at center Drainage (amount, consistency, odor) none Periwound: edematous, discolored Dressing procedure/placement/frequency: As patient is discussing operative intervention with Vascular, conservative topical wound care is initiated that will act only as an astringent and antimicrobial agent. Left foot lesion is to be painted twice daily with a betadine swabstick and allowed to air dry. No dressing will be used to obscure or constrict blood flow. Heels are to be floated to reduce risk of pressure injury.  [place] nursing team will not follow, but will remain available to this patient, the nursing and medical teams.  Please re-consult if needed. Thanks, Maudie Flakes, MSN, RN, [place], Arther Abbott  Pager# [phone]

## 2021-04-14 NOTE — ED Triage Notes (Addendum)
Pt had vascular surgery to bilateral lower extremities last week.  Reports pain to bilateral feet.  States he isn't taking any pain medication.  PA dopplered pulses in bilateral feet.

## 2021-04-14 NOTE — Consult Note (Signed)
Hospital Consult    Reason for Consult: Left lower extremity foot wound with known peripheral arterial disease Requesting Physician: Dr. Karmen Bongo MRN #:  [id number]  History of Present Illness: This is a 60 y.o. male well-known to the vascular surgery service, most recently undergoing bilateral lower extremity angiography on 04/08/2021 for [place] 5 critical limb ischemia in the left leg.  Unfortunately, the patient was found to have an unreconstructable vascular disease.  He was counseled that if the wound does not heal, he would require an amputation transmetatarsal versus below the knee.  On exam, Daniel [name] was doing well, watching football.  He complained of severe pain in the left foot, new onset edema.  He denied fever, chills.  Stated the wound on the dorsal aspect of the foot is stable.    Past Medical History:  Diagnosis Date   Anemia    Diabetes mellitus without complication [facility])    patient denies   Dialysis patient [facility])    End stage chronic kidney disease ([place])    Hypertension    ICH (intracerebral hemorrhage) ([place]) 05/20/2017   PAD (peripheral artery disease) ([facility])    Shoulder pain, left 06/28/2013    Past Surgical History:  Procedure Laterality Date   A/V FISTULAGRAM N/A 08/15/2020   Procedure: A/V FISTULAGRAM - Left Upper;  Surgeon: Cherre , MD;  Location: [place] CV [facility];  Service: Cardiovascular;  Laterality: N/A;   ABDOMINAL AORTOGRAM W/LOWER EXTREMITY N/A 04/08/2021   Procedure: ABDOMINAL AORTOGRAM W/LOWER EXTREMITY;  Surgeon: Waynetta Sandy, MD;  Location: [place] CV [facility];  Service: Cardiovascular;  Laterality: N/A;   APPENDECTOMY     AV FISTULA PLACEMENT Left 08/03/2019   Procedure: LEFT ARM ARTERIOVENOUS (AV) CEPHALIC  FISTULA CREATION;  Surgeon: Waynetta Sandy, MD;  Location: [place];  Service: Vascular;  Laterality: Left;   BIOPSY  06/30/2019   Procedure: BIOPSY;  Surgeon: Ronald Lobo, MD;  Location: [facility] ENDOSCOPY;   Service: Endoscopy;;   BIOPSY  08/02/2019   Procedure: BIOPSY;  Surgeon: Yetta Flock, MD;  Location: [facility] ENDOSCOPY;  Service: Gastroenterology;;   BIOPSY  02/08/2021   Procedure: BIOPSY;  Surgeon: Sharyn Creamer, MD;  Location: [facility] ENDOSCOPY;  Service: Gastroenterology;;   BIOPSY  02/24/2021   Procedure: BIOPSY;  Surgeon: [name], MD;  Location: [facility] ENDOSCOPY;  Service: Gastroenterology;;   BIOPSY  03/26/2021   Procedure: BIOPSY;  Surgeon: Lavena Bullion, DO;  Location: [place];  Service: Gastroenterology;;   COLONOSCOPY  01/23/2012   Procedure: COLONOSCOPY;  Surgeon: Danie Binder, MD;  [facility];  Service: Endoscopy;  Laterality: N/A;  11:10 AM   COLONOSCOPY WITH PROPOFOL N/A 06/30/2019   Procedure: COLONOSCOPY WITH PROPOFOL;  Surgeon: Ronald Lobo, MD;  Location: [place];  Service: Endoscopy;  Laterality: N/A;   ENTEROSCOPY N/A 08/02/2019   Procedure: ENTEROSCOPY;  Surgeon: Yetta Flock, MD;  Location: [facility] ENDOSCOPY;  Service: Gastroenterology;  Laterality: N/A;   ENTEROSCOPY N/A 02/24/2021   Procedure: ENTEROSCOPY;  Surgeon: [name], MD;  Location: [facility] ENDOSCOPY;  Service: Gastroenterology;  Laterality: N/A;   ENTEROSCOPY N/A 03/26/2021   Procedure: ENTEROSCOPY;  Surgeon: Lavena Bullion, DO;  Location: [place];  Service: Gastroenterology;  Laterality: N/A;   ESOPHAGOGASTRODUODENOSCOPY N/A 08/10/2020   Procedure: ESOPHAGOGASTRODUODENOSCOPY (EGD);  Surgeon: Jerene Bears, MD;  Location: [facility] ENDOSCOPY;  Service: Gastroenterology;  Laterality: N/A;   ESOPHAGOGASTRODUODENOSCOPY (EGD) WITH PROPOFOL N/A 06/30/2019   Procedure: ESOPHAGOGASTRODUODENOSCOPY (EGD) WITH PROPOFOL;  Surgeon:  Ronald Lobo, MD;  Location: [facility] ENDOSCOPY;  Service: Endoscopy;  Laterality: N/A;   ESOPHAGOGASTRODUODENOSCOPY (EGD) WITH PROPOFOL N/A 01/12/2021   Procedure: ESOPHAGOGASTRODUODENOSCOPY (EGD) WITH PROPOFOL;  Surgeon: Sharyn Creamer, MD;  Location:  [place];  Service: Gastroenterology;  Laterality: N/A;   ESOPHAGOGASTRODUODENOSCOPY (EGD) WITH PROPOFOL N/A 02/08/2021   Procedure: ESOPHAGOGASTRODUODENOSCOPY (EGD) WITH PROPOFOL;  Surgeon: Sharyn Creamer, MD;  Location: [place];  Service: Gastroenterology;  Laterality: N/A;   FISTULA SUPERFICIALIZATION Left 10/17/2019   Procedure: LEFT UPPER EXTREMITY FISTULA REVISION, SIDE BRANCH LIGATION,  AND SUPERFICIALIZATION;  Surgeon: Marty Heck, MD;  Location: [place];  Service: Vascular;  Laterality: Left;   GIVENS CAPSULE STUDY N/A 06/30/2019   Procedure: GIVENS CAPSULE STUDY;  Surgeon: Ronald Lobo, MD;  Location: [place];  Service: Endoscopy;  Laterality: N/A;   HEMOSTASIS CLIP PLACEMENT  08/10/2020   Procedure: HEMOSTASIS CLIP PLACEMENT;  Surgeon: Jerene Bears, MD;  Location: [place];  Service: Gastroenterology;;   HEMOSTASIS CLIP PLACEMENT  01/12/2021   Procedure: HEMOSTASIS CLIP PLACEMENT;  Surgeon: Sharyn Creamer, MD;  Location: [place];  Service: Gastroenterology;;   HEMOSTASIS CONTROL  08/02/2019   Procedure: HEMOSTASIS CONTROL;  Surgeon: Yetta Flock, MD;  Location: [place];  Service: Gastroenterology;;   HOT HEMOSTASIS  02/24/2021   Procedure: HOT HEMOSTASIS (ARGON PLASMA COAGULATION/BICAP);  Surgeon: [name], MD;  Location: [facility] ENDOSCOPY;  Service: Gastroenterology;;   HOT HEMOSTASIS N/A 03/26/2021   Procedure: HOT HEMOSTASIS (ARGON PLASMA COAGULATION/BICAP);  Surgeon: Lavena Bullion, DO;  Location: [facility] ENDOSCOPY;  Service: Gastroenterology;  Laterality: N/A;   INCISION AND DRAINAGE ABSCESS N/A 06/29/2016   Procedure: INCISION AND DRAINAGE ABDOMINAL WALL ABSCESS;  Surgeon: Alphonsa Overall, MD;  Location: [facility];  Service: General;  Laterality: N/A;   INSERTION OF DIALYSIS CATHETER Right 08/03/2019   Procedure: INSERTION OF DIALYSIS CATHETER;  Surgeon: Waynetta Sandy, MD;  Location: [place];  Service: Vascular;  Laterality: Right;    INSERTION OF DIALYSIS CATHETER Right 10/22/2019   Procedure: INSERTION OF 23CM TUNNELED DIALYSIS CATHETER RIGHT INTERNAL JUGULAR;  Surgeon: Angelia Mould, MD;  Location: [place];  Service: Vascular;  Laterality: Right;   INSERTION OF DIALYSIS CATHETER Right 08/12/2020   Procedure: INSERTION OF Right internal Jugular TUNNELED  DIALYSIS CATHETER.;  Surgeon: Waynetta Sandy, MD;  Location: [place];  Service: Vascular;  Laterality: Right;   Left heel surgery     PENILE BIOPSY N/A 03/26/2020   Procedure: PENILE ULCER DEBRIDEMENT;  Surgeon: Remi Haggard, MD;  Location: [facility];  Service: Urology;  Laterality: N/A;  30 MINS   SCLEROTHERAPY  01/12/2021   Procedure: SCLEROTHERAPY;  Surgeon: Sharyn Creamer, MD;  Location: [facility] ENDOSCOPY;  Service: Gastroenterology;;   Clide Deutscher  02/24/2021   Procedure: Clide Deutscher;  Surgeon: [name], MD;  Location: [facility] ENDOSCOPY;  Service: Gastroenterology;;    Allergies  Allergen Reactions   Dilaudid [Hydromorphone Hcl] Itching and Other (See Comments)    Pt reports itchiness after IM injection     Prior to Admission medications   Medication Sig Start Date End Date Taking? Authorizing Provider  amLODipine (NORVASC) 10 MG tablet Take 1 tablet (10 mg total) by mouth at bedtime. 03/23/21  Yes [name], NP  calcium acetate (PHOSLO) 667 MG capsule Take 1,334 mg by mouth 3 (three) times daily with meals. 03/14/21  Yes [provider]  cloNIDine (CATAPRES) 0.1 MG tablet Take 0.1 mg by mouth as directed. TAKE 1 TABLET BID  BUT ON DIALYSIS DAYS TAKE ONLY AT BEDTIME. 03/13/21  Yes [provider]  gabapentin (NEURONTIN) 100 MG capsule Take 1 capsule (100 mg total) by mouth 3 (three) times daily. 04/09/21  Yes Waynetta Sandy, MD  HYDROcodone-acetaminophen (NORCO/VICODIN) 5-325 MG tablet Take 1 tablet by mouth every 6 (six) hours as needed for moderate pain. 04/09/21  Yes Waynetta Sandy, MD   hydrOXYzine (ATARAX/VISTARIL) 25 MG tablet TAKE 1 TABLET BY MOUTH EVERY 8 HOURS AS NEEDED FOR ANXIETY OR  ITCHING Patient taking differently: Take 25 mg by mouth in the morning and at bedtime. 11/07/20  Yes [name], NP  lidocaine-prilocaine (EMLA) cream Apply 1 application topically once. 12/27/20  Yes [provider]  pantoprazole (PROTONIX) 40 MG tablet Take 1 tablet (40 mg total) by mouth 2 (two) times daily. 02/28/21  Yes Charlynne Cousins, MD  QUEtiapine (SEROQUEL) 50 MG tablet Take 1 tablet (50 mg total) by mouth at bedtime. 03/27/21 04/26/21 Yes Arrien, Jimmy Picket, MD  blood glucose meter kit and supplies KIT Dispense based on patient and insurance preference. Use up to four times daily as directed. (FOR ICD-9 250.00, 250.01). 07/01/16   Nita Sells, MD  colchicine 0.6 MG tablet Take 0.5 tablets (0.3 mg total) by mouth 2 (two) times daily. 07/24/20 07/29/20  Noemi Chapel, MD  ferrous sulfate 325 (65 FE) MG tablet SMARTSIG:1 Tablet(s) By Mouth Twice Daily 08/11/19 02/09/20  [provider]  furosemide (LASIX) 40 MG tablet Take 1 tablet (40 mg total) by mouth daily. 07/25/19 02/09/20  Ladona Horns, MD    Social History   Socioeconomic History   Marital status: Single    Spouse name: Not on file   Number of children: Not on file   Years of education: Not on file   Highest education level: Not on file  Occupational History   Occupation: disabled  Tobacco Use   Smoking status: Every Day    Packs/day: 0.50    Years: 45.00    Pack years: 22.50    Types: Cigarettes   Smokeless tobacco: Never  Vaping Use   Vaping Use: Never used  Substance and Sexual Activity   Alcohol use: Not Currently    Alcohol/week: 12.0 standard drinks    Types: 12 Cans of beer per week   Drug use: Not Currently    Types: "Crack" cocaine    Comment: last in 2020   Sexual activity: Yes    Birth control/protection: None  Other Topics Concern   Not on file  Social  History Narrative   Not on file   Social Determinants of Health   Financial Resource Strain: Not on file  Food Insecurity: Not on file  Transportation Needs: Not on file  Physical Activity: Not on file  Stress: Not on file  Social Connections: Not on file  Intimate Partner Violence: Not on file     Family History  Problem Relation Age of Onset   Colon cancer Neg Hx     ROS: Otherwise negative unless mentioned in HPI  Physical Examination  Vitals:   04/14/21 1114 04/14/21 1200  BP: 139/85 138/90  Pulse: 88 88  Resp: 16 16  Temp: 98 F (36.7 C)   SpO2: 99% 100%   There is no height or weight on file to calculate BMI.  General:  WDWN in NAD Gait: Not observed HENT: WNL, normocephalic Pulmonary: normal non-labored breathing, without Rales, rhonchi,  wheezing Cardiac: regular, Abdomen: soft, NT/ND, no masses Skin: without rashes  Vascular Exam/Pulses: 2 + femoral pulses, nonpalpable pedal pulses Extremities: with ischemic changes, without Gangrene , with cellulitis; with open wounds - left foot dorsal ulceration Musculoskeletal: no muscle wasting or atrophy  Neurologic: A&O X 3;  No focal weakness or paresthesias are detected; speech is fluent/normal Psychiatric:  The pt has Normal affect. Lymph:  Unremarkable  CBC    Component Value Date/Time   WBC 6.2 04/14/2021 0804   RBC 2.66 (L) 04/14/2021 0804   HGB 8.9 (L) 04/14/2021 0804   HGB 8.4 (L) 08/22/2020 1141   HCT 28.9 (L) 04/14/2021 0804   HCT 25.0 (L) 08/22/2020 1141   PLT 203 04/14/2021 0804   PLT 184 08/22/2020 1141   MCV 108.6 (H) 04/14/2021 0804   MCV 94 08/22/2020 1141   MCH 33.5 04/14/2021 0804   MCHC 30.8 04/14/2021 0804   RDW 20.1 (H) 04/14/2021 0804   RDW 16.7 (H) 08/22/2020 1141   LYMPHSABS 1.9 04/14/2021 0804   LYMPHSABS 1.8 08/22/2020 1141   MONOABS 0.8 04/14/2021 0804   EOSABS 0.1 04/14/2021 0804   EOSABS 0.3 08/22/2020 1141   BASOSABS 0.0 04/14/2021 0804   BASOSABS 0.0 08/22/2020 1141     BMET    Component Value Date/Time   NA 136 04/14/2021 0804   NA 138 08/22/2020 1141   K 4.2 04/14/2021 0804   CL 96 (L) 04/14/2021 0804   CO2 28 04/14/2021 0804   GLUCOSE 136 (H) 04/14/2021 0804   BUN 22 04/14/2021 0804   BUN 20 08/22/2020 1141   CREATININE 8.65 (H) 04/14/2021 0804   CALCIUM 9.4 04/14/2021 0804   CALCIUM 7.9 (L) 08/01/2019 0702   GFRNONAA 6 (L) 04/14/2021 0804   GFRAA 7 (L) 12/28/2019 1640    COAGS: Lab Results  Component Value Date   INR 1.0 03/24/2021   INR 1.1 10/28/2020   INR 1.1 10/27/2020     Non-Invasive Vascular Imaging:   -  Statin:  No. Beta Blocker:  No. Aspirin:  No. ACEI:  No. ARB:  No. CCB use:  Yes Other antiplatelets/anticoagulants:  No.    ASSESSMENT/PLAN: This is a 60 y.o. male with known bilateral lower extremity peripheral vascular disease.  He has unreconstructable disease in the left foot presenting today with progression in his wound with surrounding cellulitis.  The foot is painful to palpation and weightbearing.  I had a long discussion with [place] regarding his options.  Unfortunately these are limited to transmetatarsal versus below-knee amputation.  I am concerned due to his poor runoff, a transmetatarsal amputation would not heal.  In patients with end-stage renal disease, even below-knee amputations have difficulty with wound healing.  He would like to discuss his options further with Dr. Donzetta Matters tomorrow.  I will make sure this occurs. No plans for revascularization today.  Recommend broad-spectrum antibiotics.  We will continue to follow patient.   Cassandria Santee MD MS [facility] [phone] 04/14/2021  1:34 PM

## 2021-04-15 DIAGNOSIS — L97529 Non-pressure chronic ulcer of other part of left foot with unspecified severity: Secondary | ICD-10-CM

## 2021-04-15 DIAGNOSIS — I70245 Atherosclerosis of native arteries of left leg with ulceration of other part of foot: Secondary | ICD-10-CM

## 2021-04-15 LAB — BASIC METABOLIC PANEL
Anion gap: 10 (ref 5–15)
BUN: 29 mg/dL — ABNORMAL HIGH (ref 8–23)
CO2: 28 mmol/L (ref 22–32)
Calcium: 8.9 mg/dL (ref 8.9–10.3)
Chloride: 97 mmol/L — ABNORMAL LOW (ref 98–111)
Creatinine, Ser: 10.64 mg/dL — ABNORMAL HIGH (ref 0.61–1.24)
GFR, Estimated: 5 mL/min — ABNORMAL LOW (ref >60)
Glucose, Bld: 157 mg/dL — ABNORMAL HIGH (ref 70–99)
Potassium: 5.1 mmol/L (ref 3.5–5.1)
Sodium: 135 mmol/L (ref 135–145)

## 2021-04-15 LAB — CBC
HCT: 28.9 % — ABNORMAL LOW (ref 39.0–52.0)
Hemoglobin: 9 g/dL — ABNORMAL LOW (ref 13.0–17.0)
MCH: 34 pg (ref 26.0–34.0)
MCHC: 31.1 g/dL (ref 30.0–36.0)
MCV: 109.1 fL — ABNORMAL HIGH (ref 80.0–100.0)
Platelets: 195 10*3/uL (ref 150–400)
RBC: 2.65 MIL/uL — ABNORMAL LOW (ref 4.22–5.81)
RDW: 19.7 % — ABNORMAL HIGH (ref 11.5–15.5)
WBC: 5.6 10*3/uL (ref 4.0–10.5)
nRBC: 2.5 % — ABNORMAL HIGH (ref 0.0–0.2)

## 2021-04-15 LAB — CBG MONITORING, ED
Glucose-Capillary: 139 mg/dL — ABNORMAL HIGH (ref 70–99)
Glucose-Capillary: 114 mg/dL — ABNORMAL HIGH (ref 70–99)

## 2021-04-15 LAB — SURGICAL PCR SCREEN
MRSA, PCR: NEGATIVE
Staphylococcus aureus: POSITIVE — AB

## 2021-04-15 LAB — GLUCOSE, CAPILLARY
Glucose-Capillary: 154 mg/dL — ABNORMAL HIGH (ref 70–99)
Glucose-Capillary: 174 mg/dL — ABNORMAL HIGH (ref 70–99)

## 2021-04-15 MED ORDER — ALTEPLASE 2 MG IJ SOLR
2.0000 mg | Freq: Once | INTRAMUSCULAR | Status: DC | PRN
  Filled 2021-04-15: qty 2

## 2021-04-15 MED ORDER — SODIUM CHLORIDE 0.9 % IV SOLN: 100.0000 mL | INTRAVENOUS | Status: DC | PRN

## 2021-04-15 MED ORDER — HYDROCODONE-ACETAMINOPHEN 5-325 MG PO TABS
1.0000 | ORAL_TABLET | ORAL | Status: DC | PRN
Start: 2021-04-15 — End: 2021-04-25
  Administered 2021-04-16 – 2021-04-24 (×23): 2 via ORAL
  Administered 2021-04-24: 10:00:00 1 via ORAL
  Administered 2021-04-24 – 2021-04-25 (×6): 2 via ORAL
  Filled 2021-04-15 (×30): qty 2

## 2021-04-15 MED ORDER — LIDOCAINE HCL (PF) 1 % IJ SOLN
5.0000 mL | INTRAMUSCULAR | Status: DC | PRN
  Filled 2021-04-15: qty 5

## 2021-04-15 MED ORDER — CHLORHEXIDINE GLUCONATE CLOTH 2 % EX PADS
6.0000 | MEDICATED_PAD | Freq: Every day | CUTANEOUS | Status: DC
  Administered 2021-04-19 – 2021-04-25 (×6): 6 via TOPICAL

## 2021-04-15 MED ORDER — VANCOMYCIN HCL IN DEXTROSE 1-5 GM/200ML-% IV SOLN
1000.0000 mg | Freq: Once | INTRAVENOUS | Status: AC
  Administered 2021-04-16: 05:00:00 1000 mg via INTRAVENOUS
  Filled 2021-04-15: qty 200

## 2021-04-15 MED ORDER — LIDOCAINE-PRILOCAINE 2.5-2.5 % EX CREA
1.0000 "application " | TOPICAL_CREAM | CUTANEOUS | Status: DC | PRN
  Filled 2021-04-15: qty 5

## 2021-04-15 MED ORDER — VANCOMYCIN VARIABLE DOSE PER UNSTABLE RENAL FUNCTION (PHARMACIST DOSING): Status: DC

## 2021-04-15 MED ORDER — HEPARIN SODIUM (PORCINE) 1000 UNIT/ML DIALYSIS: 1000.0000 [IU] | INTRAMUSCULAR | Status: DC | PRN

## 2021-04-15 MED ORDER — PENTAFLUOROPROP-TETRAFLUOROETH EX AERO: 1.0000 "application " | INHALATION_SPRAY | CUTANEOUS | Status: DC | PRN

## 2021-04-15 NOTE — Progress Notes (Signed)
VASCULAR SURGERY:  He is agreeable to left below the knee amputation.  Have scheduled this for tomorrow morning.  I have spoken to the dialysis unit and they will dialyze him second shift.  I have written preop orders.  I have reviewed the indications for the procedure and the potential complications with the patient and he is agreeable to proceed.  All of his questions were answered.  Gae Gallop, MD 11:49 AM

## 2021-04-15 NOTE — Progress Notes (Signed)
Report given to HD nurse , waiting for transport to pick patient for HD tonight, held all his night meds till after HD, will give pain med and continue to monitor patient.

## 2021-04-15 NOTE — ED Notes (Signed)
Pt has a 2cmx2cm wound on left anterior foot above the 5th digit. It has a necrotic ctr.

## 2021-04-15 NOTE — ED Notes (Signed)
Breakfast orders placed 

## 2021-04-15 NOTE — Plan of Care (Signed)

## 2021-04-15 NOTE — H&P (View-Only) (Signed)
VASCULAR SURGERY:  He is agreeable to left below the knee amputation.  Have scheduled this for tomorrow morning.  I have spoken to the dialysis unit and they will dialyze him second shift.  I have written preop orders.  I have reviewed the indications for the procedure and the potential complications with the patient and he is agreeable to proceed.  All of his questions were answered.  Gae Gallop, MD 11:49 AM

## 2021-04-15 NOTE — ED Notes (Signed)
I cleaned pt's wound w/NS and applied a dry dressing

## 2021-04-15 NOTE — Progress Notes (Signed)
PROGRESS NOTE    KWINTON MAAHS  WLN:[id number] DOB: 1960-12-25 DOA: 04/14/2021 PCP: [name], NP   Chief Complaint  Patient presents with   Foot Pain  Brief Narrative/Hospital Course: [name], 60 y.o. male with PMH of ESRD on TTS HD; DM; HTN; ICH in 2019; and  B/L LE PAD presenting with foot infection.  He was last admitted from 12/12-13 for PAD and had aortogram with cannulation of the R common femoral artery, he does not have revascularization options available and so was to f/u with Dr. Scot Dock in 1-2 weeks to discuss TMA vs. BKA now presents with worsening pain and swelling in his foot. In the ED afebrile vitals stable labs with creatinine 8.6, CRP 1.7 lactic acid 1.6, normal WBC count anemia 8.9 g, xray- are fractures through the bases of the third, fourth, and fifth proximal phalanges, unchanged since March 30, 2021 \\We  were consulted, placed on vancomycin,Flagyl and ceftriaxone and admitted for further management.  Subjective: Seen and examined in the ED this morning.  Complains of pain on the left foot.  Sensitive to touch, no other new complaints Afebrile overnight.  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  Essential hypertension: Blood pressure is controlled on Norvasc and clonidine  ESRD on HD TTS, nephro has been consulted on admission Metabolic bone disease continue home meds, PhosLo monitor calcium phosphorus Anemia of renal disease: Hemoglobin is stable monitor  D4YC with complications, recent X4G less than 6  blood sugar stable continue sensitive sliding scale, continue Neurontin Recent Labs  Lab 04/14/21 1238 04/14/21 1733 04/14/21 2203  GLUCAP 118* 112* 130*     Class II obesity BMI 39 will benefit weight loss, outpatient OSA eval  DVT prophylaxis: heparin injection 5,000 Units Start: 04/14/21 1400 Code Status:   Code Status: Full Code Family Communication: plan of care discussed with patient at bedside. Status is: Inpatient  Remains inpatient appropriate because: For ongoing management of left foot diabetic wound and need for vascular evaluation possible amputation Disposition: Currently not medically stable for discharge. Anticipated Disposition: TBD   Objective: Vitals last 24 hrs: Vitals:   04/15/21 0245 04/15/21 0442 04/15/21 0733 04/15/21 0733  BP: (!) 145/54 108/81 (!) 115/46   Pulse: 99 (!) 105 88   Resp: (!) 23 20 15    Temp:    98.9 F (37.2 C)  TempSrc:    Oral  SpO2: 97% 99% 97%    Weight change:   Intake/Output Summary (Last 24 hours) at 04/15/2021 0820 Last data filed at 04/14/2021 2152 Gross per 24 hour  Intake 796.02 ml  Output --  Net 796.02 ml   Net IO Since Admission: 796.02 mL [04/15/21 0820]   Physical Examination: General exam: AA0X3, obese, pleasant, NAD HEENT:Oral mucosa moist, Ear/Nose WNL grossly,dentition normal. Respiratory system: B/l CLEAR BS, no use of accessory muscle, non tender. Cardiovascular system: S1 & S2 +,No JVD. Gastrointestinal system: Abdomen soft, NT,ND, BS+. Nervous System:Alert, awake, moving extremities. Extremities: Left foot with dressing in place.  Removed, very sensitive towards tender Skin: No rashes, no icterus. MSK: Normal muscle bulk, tone, power.  Medications reviewed:  Scheduled Meds:  amLODipine  10 mg Oral QHS   calcium acetate  1,334 mg Oral TID WC   [START ON 04/16/2021] cloNIDine  0.1 mg Oral Once per day on Tue Thu Sat   cloNIDine  0.1 mg Oral 2 times per day on Sun Mon Wed Fri   gabapentin  100 mg  Oral TID   heparin  5,000 Units Subcutaneous Q8H   insulin aspart  0-6 Units Subcutaneous TID WC   nicotine  14 mg Transdermal Daily   pantoprazole  40 mg Oral BID   QUEtiapine  50 mg Oral QHS   Continuous Infusions:  cefTRIAXone (ROCEPHIN)  IV Stopped (04/14/21 1634)   metronidazole Stopped (04/15/21 9562)   Diet Order             Diet renal with fluid restriction Fluid restriction: 1200 mL Fluid; Room service appropriate? Yes; Fluid consistency: Thin  Diet effective now                   Weight change:   Wt Readings from Last 3 Encounters:  04/09/21 127.2 kg  04/04/21 134.3 kg  03/27/21 127.7 kg     Consultants:see note  Procedures:see note Antimicrobials: Anti-infectives (From admission, onward)    Start     Dose/Rate Route Frequency Ordered Stop   04/14/21 1130  vancomycin (VANCOCIN) 2,500 mg in sodium chloride 0.9 % 500 mL IVPB        2,500 mg 250 mL/hr over 120 Minutes Intravenous  Once 04/14/21 1120 04/14/21 2006   04/14/21 1115  cefTRIAXone (ROCEPHIN) 2 g in sodium chloride 0.9 % 100 mL IVPB        2 g 200 mL/hr over 30 Minutes Intravenous Every 24 hours 04/14/21 1109 04/21/21 1114   04/14/21 1115  metroNIDAZOLE (FLAGYL) IVPB 500 mg        500 mg 100 mL/hr over 60 Minutes Intravenous Every 8 hours 04/14/21 1109 04/21/21 1114   04/14/21 0945  piperacillin-tazobactam (ZOSYN) IVPB 2.25 g  Status:  Discontinued        2.25 g 100 mL/hr over 30 Minutes Intravenous Every 8 hours 04/14/21 0940 04/14/21 1109      Culture/Microbiology    Component Value Date/Time   SDES BLOOD RIGHT ANTECUBITAL 02/23/2021 1155   SPECREQUEST  02/23/2021 1155    BOTTLES DRAWN AEROBIC AND ANAEROBIC Blood Culture results may not be optimal due to an inadequate volume of blood received in culture bottles   CULT  02/23/2021 1155    NO GROWTH 5 DAYS Performed at [facility], [place] [address] [place], [place] [address]    REPTSTATUS 02/28/2021 FINAL 02/23/2021 1155    Other  culture-see note  Unresulted Labs (From admission, onward)     Start     Ordered   04/14/21 0842  Blood culture (routine x 2)  BLOOD CULTURE X 2,   STAT      04/14/21 0841   04/14/21 0839  Lactic acid, plasma  Now then every 2 hours,   STAT      04/14/21 5784          Data Reviewed: I have personally reviewed following labs and imaging studies CBC: Recent Labs  Lab 04/09/21 0415 04/14/21 0804 04/15/21 0508  WBC 5.1 6.2 5.6  NEUTROABS  --  3.3  --   HGB 7.6* 8.9* 9.0*  HCT 24.5* 28.9* 28.9*  MCV 104.3* 108.6* 109.1*  PLT 165 203 696   Basic Metabolic Panel: Recent Labs  Lab 04/09/21 0930 04/14/21  5170 04/15/21 0508  NA 139 136 135  K 4.6 4.2 5.1  CL 97* 96* 97*  CO2 25 28 28   GLUCOSE 89 136* 157*  BUN 40* 22 29*  CREATININE 12.93* 8.65* 10.64*  CALCIUM 8.9 9.4 8.9  PHOS 5.7*  --   --    GFR: Estimated Creatinine Clearance: 9.9 mL/min (A) (by C-G formula based on SCr of 10.64 mg/dL (H)). Liver Function Tests: Recent Labs  Lab 04/09/21 0930  ALBUMIN 2.3*   No results for input(s): LIPASE, AMYLASE in the last 168 hours. No results for input(s): AMMONIA in the last 168 hours. Coagulation Profile: No results for input(s): INR, PROTIME in the last 168 hours. Cardiac Enzymes: No results for input(s): CKTOTAL, CKMB, CKMBINDEX, TROPONINI in the last 168 hours. BNP (last 3 results) No results for input(s): PROBNP in the last 8760 hours. HbA1C: No results for input(s): HGBA1C in the last 72 hours. CBG: Recent Labs  Lab 04/14/21 1238 04/14/21 1733 04/14/21 2203  GLUCAP 118* 112* 130*   Lipid Profile: No results for input(s): CHOL, HDL, LDLCALC, TRIG, CHOLHDL, LDLDIRECT in the last 72 hours. Thyroid Function Tests: No results for input(s): TSH, T4TOTAL, FREET4, T3FREE, THYROIDAB in the last 72 hours. Anemia Panel: No results for input(s): VITAMINB12, FOLATE, FERRITIN, TIBC, IRON, RETICCTPCT in the last 72 hours. Sepsis Labs: Recent Labs  Lab  04/14/21 0839  LATICACIDVEN 1.6    Recent Results (from the past 240 hour(s))  Resp Panel by RT-PCR (Flu A&B, Covid) Nasopharyngeal Swab     Status: None   Collection Time: 04/14/21 10:25 AM   Specimen: Nasopharyngeal Swab; Nasopharyngeal(NP) swabs in vial transport medium  Result Value Ref Range Status   SARS Coronavirus 2 by RT PCR NEGATIVE NEGATIVE Final    Comment: (NOTE) SARS-CoV-2 target nucleic acids are NOT DETECTED.  The SARS-CoV-2 RNA is generally detectable in upper respiratory specimens during the acute phase of infection. The lowest concentration of SARS-CoV-2 viral copies this assay can detect is 138 copies/mL. A negative result does not preclude SARS-Cov-2 infection and should not be used as the sole basis for treatment or other patient management decisions. A negative result may occur with  improper specimen collection/handling, submission of specimen other than nasopharyngeal swab, presence of viral mutation(s) within the areas targeted by this assay, and inadequate number of viral copies(<138 copies/mL). A negative result must be combined with clinical observations, patient history, and epidemiological information. The expected result is Negative.  Fact Sheet for Patients:  EntrepreneurPulse.com.au  Fact Sheet for Healthcare Providers:  IncredibleEmployment.be  This test is no t yet approved or cleared by the [place] FDA and  has been authorized for detection and/or diagnosis of SARS-CoV-2 by FDA under an Emergency Use Authorization (EUA). This EUA will remain  in effect (meaning this test can be used) for the duration of the COVID-19 declaration under Section 564(b)(1) of the Act, 21 U.S.C.section 360bbb-3(b)(1), unless the authorization is terminated  or revoked sooner.       Influenza A by PCR NEGATIVE NEGATIVE Final   Influenza B by PCR NEGATIVE NEGATIVE Final    Comment: (NOTE) The Xpert Xpress  SARS-CoV-2/FLU/RSV plus assay is intended as an aid in the diagnosis of influenza from Nasopharyngeal swab specimens and should not be used as a sole basis for treatment. Nasal washings and aspirates are unacceptable for Xpert Xpress SARS-CoV-2/FLU/RSV testing.  Fact Sheet for Patients: EntrepreneurPulse.com.au  Fact Sheet for Healthcare Providers: IncredibleEmployment.be  This test is not yet approved or  cleared by the [place] and has been authorized for detection and/or diagnosis of SARS-CoV-2 by FDA under an Emergency Use Authorization (EUA). This EUA will remain in effect (meaning this test can be used) for the duration of the COVID-19 declaration under Section 564(b)(1) of the Act, 21 U.S.C. section 360bbb-3(b)(1), unless the authorization is terminated or revoked.  Performed at [facility], [place] [address], [place], [place] [address]      Radiology Studies: DG Foot Complete Left  Result Date: 04/14/2021 CLINICAL DATA:  Infection. Pain in bilateral feet. Wound on the top of the left foot. EXAM: LEFT FOOT - COMPLETE 3+ VIEW COMPARISON:  None. FINDINGS: Fractures are seen through the bases of the third, fourth, and fifth proximal phalanges. No evidence of osteomyelitis. No other abnormalities. IMPRESSION: There are fractures through the bases of the third, fourth, and fifth proximal phalanges, unchanged since March 30, 2021. No osteomyelitis identified. Electronically Signed   By: Dorise Bullion III M.D.   On: 04/14/2021 10:03   VAS [place] LOWER EXTREMITY VENOUS (DVT) (7a-7p)  Result Date: 04/14/2021  Lower Venous DVT Study Patient Name:  FODAY CONE  Date of Exam:   04/14/2021 Medical Rec #: [id number]           Accession #:    [id number] Date of Birth: October 11, 1960           Patient Gender: M Patient Age:   60 years Exam Location:  [facility] Procedure:      VAS [place] LOWER EXTREMITY VENOUS (DVT) Referring Phys: Ova Freshwater  FONDAW --------------------------------------------------------------------------------  Indications: Edema. Other Indications: Non healing wound left foot. Risk Factors: Patient with rest pain and no options to improve flow to the left foot. Limitations: Involuntary movement secondary to rest pain in left foot. Comparison Study: Prior negative bilateral LEVs done 03/19/21 and 03/25/21 Performing Technologist: Sharion Dove RVS  Examination Guidelines: A complete evaluation includes B-mode imaging, spectral Doppler, color Doppler, and power Doppler as needed of all accessible portions of each vessel. Bilateral testing is considered an integral part of a complete examination. Limited examinations for reoccurring indications may be performed as noted. The reflux portion of the exam is performed with the patient in reverse Trendelenburg.  +-----+---------------+---------+-----------+----------+--------------+  RIGHT Compressibility Phasicity Spontaneity Properties Thrombus Aging  +-----+---------------+---------+-----------+----------+--------------+  CFV   Full            Yes       Yes                                    +-----+---------------+---------+-----------+----------+--------------+   +---------+---------------+---------+-----------+----------+--------------+  LEFT      Compressibility Phasicity Spontaneity Properties Thrombus Aging  +---------+---------------+---------+-----------+----------+--------------+  CFV       Full            Yes       Yes                                    +---------+---------------+---------+-----------+----------+--------------+  SFJ       Full                                                             +---------+---------------+---------+-----------+----------+--------------+  FV Prox   Full                                                             +---------+---------------+---------+-----------+----------+--------------+  FV Mid    Full                                                              +---------+---------------+---------+-----------+----------+--------------+  FV Distal Full                                                             +---------+---------------+---------+-----------+----------+--------------+  PFV       Full                                                             +---------+---------------+---------+-----------+----------+--------------+  POP       Full            Yes       Yes                                    +---------+---------------+---------+-----------+----------+--------------+  PTV       Full            Yes       Yes                                    +---------+---------------+---------+-----------+----------+--------------+     Summary: RIGHT: - No evidence of common femoral vein obstruction.  LEFT: - There is no evidence of deep vein thrombosis in the lower extremity.  Interstitial edema noted throughout the left calf.  *See table(s) above for measurements and observations. Electronically signed by Orlie Pollen on 04/14/2021 at 12:05:45 PM.    Final      LOS: 1 day   Antonieta Pert, MD [facility]  04/15/2021, 8:20 AM

## 2021-04-15 NOTE — Consult Note (Addendum)
[facility] Renal Consultation Note    Indication for Consultation:  Management of ESRD/hemodialysis; anemia, hypertension/volume and secondary hyperparathyroidism  LMB:[id number], Milford Cage, NP  HPI: Daniel [name] is a 60 y.o. male with ESRD on HD TTS at [facility]. His past medical history significant for DM; HTN; ICH in 2019; and  B/L LE PAD presenting with worsening L foot pain and infection.  He was last hospitalized from 12/12-12/13/22 for PAD and had aortogram with cannulation of the R common femoral artery. Patient with limited revascularization options.  Patient seen by Dr. Scot Dock. His last HD was on 04/13/21 where she received full treatment. Plan for HD tonight. Patient scheduled for L BKA 04/16/21 in AM.   Past Medical History:  Diagnosis Date   Anemia    Diabetes mellitus without complication [facility])    patient denies   Dialysis patient [facility])    End stage chronic kidney disease ([place])    Hypertension    ICH (intracerebral hemorrhage) ([place]) 05/20/2017   PAD (peripheral artery disease) ([facility])    Shoulder pain, left 06/28/2013   Past Surgical History:  Procedure Laterality Date   A/V FISTULAGRAM N/A 08/15/2020   Procedure: A/V FISTULAGRAM - Left Upper;  Surgeon: Cherre Robins, MD;  Location: [place] CV LAB;  Service: Cardiovascular;  Laterality: N/A;   ABDOMINAL AORTOGRAM W/LOWER EXTREMITY N/A 04/08/2021   Procedure: ABDOMINAL AORTOGRAM W/LOWER EXTREMITY;  Surgeon: Waynetta Sandy, MD;  Location: [place] CV LAB;  Service: Cardiovascular;  Laterality: N/A;   APPENDECTOMY     AV FISTULA PLACEMENT Left 08/03/2019   Procedure: LEFT ARM ARTERIOVENOUS (AV) CEPHALIC  FISTULA CREATION;  Surgeon: Waynetta Sandy, MD;  Location: [place];  Service: Vascular;  Laterality: Left;   BIOPSY  06/30/2019   Procedure: BIOPSY;  Surgeon: Ronald Lobo, MD;  Location: [facility] ENDOSCOPY;  Service: Endoscopy;;   BIOPSY  08/02/2019   Procedure: BIOPSY;   Surgeon: Yetta Flock, MD;  Location: [facility] ENDOSCOPY;  Service: Gastroenterology;;   BIOPSY  02/08/2021   Procedure: BIOPSY;  Surgeon: Sharyn Creamer, MD;  Location: [facility] ENDOSCOPY;  Service: Gastroenterology;;   BIOPSY  02/24/2021   Procedure: BIOPSY;  Surgeon: [name], MD;  Location: [facility] ENDOSCOPY;  Service: Gastroenterology;;   BIOPSY  03/26/2021   Procedure: BIOPSY;  Surgeon: Lavena Bullion, DO;  Location: [place];  Service: Gastroenterology;;   COLONOSCOPY  01/23/2012   Procedure: COLONOSCOPY;  Surgeon: Danie Binder, MD;  [facility];  Service: Endoscopy;  Laterality: N/A;  11:10 AM   COLONOSCOPY WITH PROPOFOL N/A 06/30/2019   Procedure: COLONOSCOPY WITH PROPOFOL;  Surgeon: Ronald Lobo, MD;  Location: [place];  Service: Endoscopy;  Laterality: N/A;   ENTEROSCOPY N/A 08/02/2019   Procedure: ENTEROSCOPY;  Surgeon: Yetta Flock, MD;  Location: [facility] ENDOSCOPY;  Service: Gastroenterology;  Laterality: N/A;   ENTEROSCOPY N/A 02/24/2021   Procedure: ENTEROSCOPY;  Surgeon: [name], MD;  Location: [facility] ENDOSCOPY;  Service: Gastroenterology;  Laterality: N/A;   ENTEROSCOPY N/A 03/26/2021   Procedure: ENTEROSCOPY;  Surgeon: Lavena Bullion, DO;  Location: [place];  Service: Gastroenterology;  Laterality: N/A;   ESOPHAGOGASTRODUODENOSCOPY N/A 08/10/2020   Procedure: ESOPHAGOGASTRODUODENOSCOPY (EGD);  Surgeon: Jerene Bears, MD;  Location: [facility] ENDOSCOPY;  Service: Gastroenterology;  Laterality: N/A;   ESOPHAGOGASTRODUODENOSCOPY (EGD) WITH PROPOFOL N/A 06/30/2019   Procedure: ESOPHAGOGASTRODUODENOSCOPY (EGD) WITH PROPOFOL;  Surgeon: Ronald Lobo, MD;  Location: [place];  Service: Endoscopy;  Laterality: N/A;  ESOPHAGOGASTRODUODENOSCOPY (EGD) WITH PROPOFOL N/A 01/12/2021   Procedure: ESOPHAGOGASTRODUODENOSCOPY (EGD) WITH PROPOFOL;  Surgeon: Sharyn Creamer, MD;  Location: [place];  Service: Gastroenterology;  Laterality: N/A;    ESOPHAGOGASTRODUODENOSCOPY (EGD) WITH PROPOFOL N/A 02/08/2021   Procedure: ESOPHAGOGASTRODUODENOSCOPY (EGD) WITH PROPOFOL;  Surgeon: Sharyn Creamer, MD;  Location: [place];  Service: Gastroenterology;  Laterality: N/A;   FISTULA SUPERFICIALIZATION Left 10/17/2019   Procedure: LEFT UPPER EXTREMITY FISTULA REVISION, SIDE BRANCH LIGATION,  AND SUPERFICIALIZATION;  Surgeon: Marty Heck, MD;  Location: [place];  Service: Vascular;  Laterality: Left;   GIVENS CAPSULE STUDY N/A 06/30/2019   Procedure: GIVENS CAPSULE STUDY;  Surgeon: Ronald Lobo, MD;  Location: [place];  Service: Endoscopy;  Laterality: N/A;   HEMOSTASIS CLIP PLACEMENT  08/10/2020   Procedure: HEMOSTASIS CLIP PLACEMENT;  Surgeon: Jerene Bears, MD;  Location: [place];  Service: Gastroenterology;;   HEMOSTASIS CLIP PLACEMENT  01/12/2021   Procedure: HEMOSTASIS CLIP PLACEMENT;  Surgeon: Sharyn Creamer, MD;  Location: [place];  Service: Gastroenterology;;   HEMOSTASIS CONTROL  08/02/2019   Procedure: HEMOSTASIS CONTROL;  Surgeon: Yetta Flock, MD;  Location: [place];  Service: Gastroenterology;;   HOT HEMOSTASIS  02/24/2021   Procedure: HOT HEMOSTASIS (ARGON PLASMA COAGULATION/BICAP);  Surgeon: [name], MD;  Location: [facility] ENDOSCOPY;  Service: Gastroenterology;;   HOT HEMOSTASIS N/A 03/26/2021   Procedure: HOT HEMOSTASIS (ARGON PLASMA COAGULATION/BICAP);  Surgeon: Lavena Bullion, DO;  Location: [facility] ENDOSCOPY;  Service: Gastroenterology;  Laterality: N/A;   INCISION AND DRAINAGE ABSCESS N/A 06/29/2016   Procedure: INCISION AND DRAINAGE ABDOMINAL WALL ABSCESS;  Surgeon: Alphonsa Overall, MD;  Location: [facility];  Service: General;  Laterality: N/A;   INSERTION OF DIALYSIS CATHETER Right 08/03/2019   Procedure: INSERTION OF DIALYSIS CATHETER;  Surgeon: Waynetta Sandy, MD;  Location: [place];  Service: Vascular;  Laterality: Right;   INSERTION OF DIALYSIS CATHETER Right 10/22/2019   Procedure:  INSERTION OF 23CM TUNNELED DIALYSIS CATHETER RIGHT INTERNAL JUGULAR;  Surgeon: Angelia Mould, MD;  Location: [place];  Service: Vascular;  Laterality: Right;   INSERTION OF DIALYSIS CATHETER Right 08/12/2020   Procedure: INSERTION OF Right internal Jugular TUNNELED  DIALYSIS CATHETER.;  Surgeon: Waynetta Sandy, MD;  Location: [place];  Service: Vascular;  Laterality: Right;   Left heel surgery     PENILE BIOPSY N/A 03/26/2020   Procedure: PENILE ULCER DEBRIDEMENT;  Surgeon: Remi Haggard, MD;  Location: [facility];  Service: Urology;  Laterality: N/A;  30 MINS   SCLEROTHERAPY  01/12/2021   Procedure: SCLEROTHERAPY;  Surgeon: Sharyn Creamer, MD;  Location: [facility] ENDOSCOPY;  Service: Gastroenterology;;   Clide Deutscher  02/24/2021   Procedure: Clide Deutscher;  Surgeon: [name], MD;  Location: [facility] ENDOSCOPY;  Service: Gastroenterology;;   Family History  Problem Relation Age of Onset   Colon cancer Neg Hx    Social History:  reports that he has been smoking cigarettes. He has a 22.50 pack-year smoking history. He has never used smokeless tobacco. He reports that he does not currently use alcohol after a past usage of about 12.0 standard drinks per week. He reports that he does not currently use drugs after having used the following drugs: "Crack" cocaine. Allergies  Allergen Reactions   Dilaudid [Hydromorphone Hcl] Itching and Other (See Comments)    Pt reports itchiness after IM injection    Prior to Admission medications   Medication Sig Start Date End Date Taking? Authorizing Provider  amLODipine (NORVASC) 10  MG tablet Take 1 tablet (10 mg total) by mouth at bedtime. 03/23/21  Yes [name], NP  calcium acetate (PHOSLO) 667 MG capsule Take 1,334 mg by mouth 3 (three) times daily with meals. 03/14/21  Yes [provider]  cloNIDine (CATAPRES) 0.1 MG tablet Take 0.1 mg by mouth as directed. TAKE 1 TABLET BID BUT ON DIALYSIS DAYS TAKE ONLY AT BEDTIME.  03/13/21  Yes [provider]  gabapentin (NEURONTIN) 100 MG capsule Take 1 capsule (100 mg total) by mouth 3 (three) times daily. 04/09/21  Yes Waynetta Sandy, MD  HYDROcodone-acetaminophen (NORCO/VICODIN) 5-325 MG tablet Take 1 tablet by mouth every 6 (six) hours as needed for moderate pain. 04/09/21  Yes Waynetta Sandy, MD  hydrOXYzine (ATARAX/VISTARIL) 25 MG tablet TAKE 1 TABLET BY MOUTH EVERY 8 HOURS AS NEEDED FOR ANXIETY OR  ITCHING Patient taking differently: Take 25 mg by mouth in the morning and at bedtime. 11/07/20  Yes [name], NP  lidocaine-prilocaine (EMLA) cream Apply 1 application topically once. 12/27/20  Yes [provider]  pantoprazole (PROTONIX) 40 MG tablet Take 1 tablet (40 mg total) by mouth 2 (two) times daily. 02/28/21  Yes Charlynne Cousins, MD  QUEtiapine (SEROQUEL) 50 MG tablet Take 1 tablet (50 mg total) by mouth at bedtime. 03/27/21 04/26/21 Yes Arrien, Jimmy Picket, MD  blood glucose meter kit and supplies KIT Dispense based on patient and insurance preference. Use up to four times daily as directed. (FOR ICD-9 250.00, 250.01). 07/01/16   Nita Sells, MD  colchicine 0.6 MG tablet Take 0.5 tablets (0.3 mg total) by mouth 2 (two) times daily. 07/24/20 07/29/20  Noemi Chapel, MD  ferrous sulfate 325 (65 FE) MG tablet SMARTSIG:1 Tablet(s) By Mouth Twice Daily 08/11/19 02/09/20  [provider]  furosemide (LASIX) 40 MG tablet Take 1 tablet (40 mg total) by mouth daily. 07/25/19 02/09/20  Ladona Horns, MD   Current Facility-Administered Medications  Medication Dose Route Frequency Provider Last Rate Last Admin   0.9 %  sodium chloride infusion  100 mL Intravenous PRN Adelfa Koh, NP       0.9 %  sodium chloride infusion  100 mL Intravenous PRN Adelfa Koh, NP       acetaminophen (TYLENOL) tablet 650 mg  650 mg Oral Q6H PRN Karmen Bongo, MD   650 mg at 04/15/21 7124   Or   acetaminophen  (TYLENOL) suppository 650 mg  650 mg Rectal Q6H PRN Karmen Bongo, MD       alteplase (CATHFLO ACTIVASE) injection 2 mg  2 mg Intracatheter Once PRN Adelfa Koh, NP       amLODipine (NORVASC) tablet 10 mg  10 mg Oral Ivery Quale, MD   10 mg at 04/14/21 2153   calcium acetate (PHOSLO) capsule 1,334 mg  1,334 mg Oral TID WC Karmen Bongo, MD   1,334 mg at 04/15/21 1303   calcium carbonate (dosed in mg elemental calcium) suspension 500 mg of elemental calcium  500 mg of elemental calcium Oral Q6H PRN Karmen Bongo, MD       camphor-menthol [facility]) lotion 1 application  1 application Topical P8K PRN Karmen Bongo, MD       cefTRIAXone (ROCEPHIN) 2 g in sodium chloride 0.9 % 100 mL IVPB  2 g Intravenous Q24H Karmen Bongo, MD   Stopped at 04/15/21 1101   [START ON 04/16/2021] Chlorhexidine Gluconate Cloth 2 % PADS 6 each  6 each Topical Q0600 Tobie Poet  E, NP       [START ON 04/16/2021] cloNIDine (CATAPRES) tablet 0.1 mg  0.1 mg Oral Once per day on Tue Thu Sat Heloise Purpura, [facility]       cloNIDine (CATAPRES) tablet 0.1 mg  0.1 mg Oral 2 times per day on Sun Mon Wed Fri Heloise Purpura, RPH   0.1 mg at 04/14/21 2207   docusate sodium (ENEMEEZ) enema 283 mg  1 enema Rectal PRN Karmen Bongo, MD       feeding supplement (NEPRO CARB STEADY) liquid 237 mL  237 mL Oral TID PRN Karmen Bongo, MD       gabapentin (NEURONTIN) capsule 100 mg  100 mg Oral TID Karmen Bongo, MD   100 mg at 04/15/21 0949   heparin injection 1,000 Units  1,000 Units Dialysis PRN Adelfa Koh, NP       heparin injection 5,000 Units  5,000 Units Subcutaneous Lynne Logan, MD   5,000 Units at 04/15/21 9480   hydrALAZINE (APRESOLINE) injection 5 mg  5 mg Intravenous Q4H PRN Karmen Bongo, MD       HYDROcodone-acetaminophen (NORCO/VICODIN) 5-325 MG per tablet 1 tablet  1 tablet Oral Q6H PRN Karmen Bongo, MD   1 tablet at 04/15/21 1422   hydrOXYzine (ATARAX) tablet 25 mg  25  mg Oral Q8H PRN Karmen Bongo, MD       insulin aspart (novoLOG) injection 0-6 Units  0-6 Units Subcutaneous TID WC Karmen Bongo, MD       lidocaine (PF) (XYLOCAINE) 1 % injection 5 mL  5 mL Intradermal PRN Adelfa Koh, NP       lidocaine-prilocaine (EMLA) cream 1 application  1 application Topical PRN Adelfa Koh, NP       metroNIDAZOLE (FLAGYL) IVPB 500 mg  500 mg Intravenous Lynne Logan, MD   Stopped at 04/15/21 1204   nicotine (NICODERM CQ - dosed in mg/24 hours) patch 14 mg  14 mg Transdermal Daily Karmen Bongo, MD   14 mg at 04/15/21 1655   pantoprazole (PROTONIX) EC tablet 40 mg  40 mg Oral BID Karmen Bongo, MD   40 mg at 04/15/21 3748   pentafluoroprop-tetrafluoroeth (GEBAUERS) aerosol 1 application  1 application Topical PRN Adelfa Koh, NP       QUEtiapine (SEROQUEL) tablet 50 mg  50 mg Oral Ivery Quale, MD   50 mg at 04/14/21 2153   sorbitol 70 % solution 30 mL  30 mL Oral PRN Karmen Bongo, MD       vancomycin variable dose per unstable renal function (pharmacist dosing)   Does not apply See admin instructions Antonieta Pert, MD       zolpidem (AMBIEN) tablet 5 mg  5 mg Oral QHS PRN Karmen Bongo, MD       Labs: Basic Metabolic Panel: Recent Labs  Lab 04/09/21 0930 04/14/21 0804 04/15/21 0508  NA 139 136 135  K 4.6 4.2 5.1  CL 97* 96* 97*  CO2 25 28 28   GLUCOSE 89 136* 157*  BUN 40* 22 29*  CREATININE 12.93* 8.65* 10.64*  CALCIUM 8.9 9.4 8.9  PHOS 5.7*  --   --    Liver Function Tests: Recent Labs  Lab 04/09/21 0930  ALBUMIN 2.3*   No results for input(s): LIPASE, AMYLASE in the last 168 hours. No results for input(s): AMMONIA in the last 168 hours. CBC: Recent Labs  Lab 04/09/21 0415 04/14/21 0804 04/15/21 0508  WBC 5.1 6.2 5.6  NEUTROABS  --  3.3  --   HGB 7.6* 8.9* 9.0*  HCT 24.5* 28.9* 28.9*  MCV 104.3* 108.6* 109.1*  PLT 165 203 195   Cardiac Enzymes: No results for input(s): CKTOTAL, CKMB,  CKMBINDEX, TROPONINI in the last 168 hours. CBG: Recent Labs  Lab 04/14/21 1238 04/14/21 1733 04/14/21 2203 04/15/21 0836 04/15/21 1144  GLUCAP 118* 112* 130* 139* 114*   Iron Studies: No results for input(s): IRON, TIBC, TRANSFERRIN, FERRITIN in the last 72 hours. Studies/Results: DG Foot Complete Left  Result Date: 04/14/2021 CLINICAL DATA:  Infection. Pain in bilateral feet. Wound on the top of the left foot. EXAM: LEFT FOOT - COMPLETE 3+ VIEW COMPARISON:  None. FINDINGS: Fractures are seen through the bases of the third, fourth, and fifth proximal phalanges. No evidence of osteomyelitis. No other abnormalities. IMPRESSION: There are fractures through the bases of the third, fourth, and fifth proximal phalanges, unchanged since March 30, 2021. No osteomyelitis identified. Electronically Signed   By: Dorise Bullion III M.D.   On: 04/14/2021 10:03   VAS [place] LOWER EXTREMITY VENOUS (DVT) (7a-7p)  Result Date: 04/14/2021  Lower Venous DVT Study Patient Name:  Daniel [name]  Date of Exam:   04/14/2021 Medical Rec #: [id number]           Accession #:    [id number] Date of Birth: Jun 24, 1960           Patient Gender: M Patient Age:   61 years Exam Location:  [facility] Procedure:      VAS [place] LOWER EXTREMITY VENOUS (DVT) Referring Phys: Ova Freshwater FONDAW --------------------------------------------------------------------------------  Indications: Edema. Other Indications: Non healing wound left foot. Risk Factors: Patient with rest pain and no options to improve flow to the left foot. Limitations: Involuntary movement secondary to rest pain in left foot. Comparison Study: Prior negative bilateral LEVs done 03/19/21 and 03/25/21 Performing Technologist: Sharion Dove RVS  Examination Guidelines: A complete evaluation includes B-mode imaging, spectral Doppler, color Doppler, and power Doppler as needed of all accessible portions of each vessel. Bilateral testing is considered an integral part  of a complete examination. Limited examinations for reoccurring indications may be performed as noted. The reflux portion of the exam is performed with the patient in reverse Trendelenburg.  +-----+---------------+---------+-----------+----------+--------------+  RIGHT Compressibility Phasicity Spontaneity Properties Thrombus Aging  +-----+---------------+---------+-----------+----------+--------------+  CFV   Full            Yes       Yes                                    +-----+---------------+---------+-----------+----------+--------------+   +---------+---------------+---------+-----------+----------+--------------+  LEFT      Compressibility Phasicity Spontaneity Properties Thrombus Aging  +---------+---------------+---------+-----------+----------+--------------+  CFV       Full            Yes       Yes                                    +---------+---------------+---------+-----------+----------+--------------+  SFJ       Full                                                             +---------+---------------+---------+-----------+----------+--------------+  FV Prox   Full                                                             +---------+---------------+---------+-----------+----------+--------------+  FV Mid    Full                                                             +---------+---------------+---------+-----------+----------+--------------+  FV Distal Full                                                             +---------+---------------+---------+-----------+----------+--------------+  PFV       Full                                                             +---------+---------------+---------+-----------+----------+--------------+  POP       Full            Yes       Yes                                    +---------+---------------+---------+-----------+----------+--------------+  PTV       Full            Yes       Yes                                     +---------+---------------+---------+-----------+----------+--------------+     Summary: RIGHT: - No evidence of common femoral vein obstruction.  LEFT: - There is no evidence of deep vein thrombosis in the lower extremity.  Interstitial edema noted throughout the left calf.  *See table(s) above for measurements and observations. Electronically signed by Orlie Pollen on 04/14/2021 at 12:05:45 PM.    Final     Physical Exam: Vitals:   04/15/21 1026 04/15/21 1100 04/15/21 1200 04/15/21 1300  BP: 103/64 (!) 103/55 (!) 128/49 117/90  Pulse: 81 80 88 90  Resp: 18 18 18 19   Temp:      TempSrc:      SpO2: 98% 96% 100% 98%     General: WDWN NAD Head: NCAT sclera not icteric Lungs: CTA bilaterally. No wheeze, rales or rhonchi. Breathing is unlabored. Heart: RRR. No murmur, rubs or gallops.  Abdomen: soft, nontender, +BS, no guarding, no rebound tenderness  Lower extremities: no edema, noted quarter-size open wound L foot-no drainage present Neuro: AAOx3. Moves all extremities spontaneously. Dialysis Access: Noted small open area, thinning, and shining skin at L AVF site- (+) Bruit Thrill  Dialysis Orders:  TTS - [place] 4hrs, BFR 450,  DFR 500,  [id number], 2K/ 2Ca Mircera 225 mcg q2wks - last 04/06/21 No Systemic Heparin Hectorol 57mg IV with HD-04/13/21 Venofer 1055mIV X 6 doses-last 04/13/21    Last Labs: Hgb 9.0, K 5.1, Ca 8.9  Assessment/Plan: Diabetic L foot infection/PVD-Underwent LE angiography 04/08/21 showed limb ischemia L leg. Seen by Dr. DiScot DockPlan for L BKA 04/16/21 in AM. On ABX. ESRD - usual scheduled TTS. Plan for HD tonight in preparation for L BKA tomorrow AM. Noted small open area with shinny and thinning skin at L AVF. Spoke with Dr. DiScot Dockho will assess the area. Advised HD staff to ensure they cannulate at other sites. Hypertension/volume  - Doesn't appear in volume overload, Bps stable. Continue current anti-hypertensive regimen Anemia of CKD -  Hgb now 9.0, on 2 week ESA-dose not due yet. Fe held for now d/t current ABX regimen. Follow trends. Secondary Hyperparathyroidism - Ca ok, will check PO4 in AM.  Nutrition - Renal diet with fluid restriction, NPO after midnight  CoTobie PoetNP [facility]2/19/2022, 3:45 PM    I have seen and examined this patient and agree with plan and assessment in the above note with renal recommendations/intervention highlighted.  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  Dr. DiScot Docko evaluate the avf as well as perform LBKA tomorrow.  Will plan for HD tonight so as not to interfere with timing of surgery tomorrow.  JoGovernor Rooksoladonato,MD 04/15/2021 6:57 PM

## 2021-04-16 ENCOUNTER — Inpatient Hospital Stay (HOSPITAL_COMMUNITY): Payer: Medicare Other | Admitting: Certified Registered Nurse Anesthetist

## 2021-04-16 ENCOUNTER — Encounter (HOSPITAL_COMMUNITY)
Admission: EM | Disposition: A | Discharge status: Skilled Nursing Facility | Payer: Self-pay | Source: Home / Self Care | Attending: Internal Medicine

## 2021-04-16 HISTORY — PX: AMPUTATION: SHX166

## 2021-04-16 LAB — BASIC METABOLIC PANEL
Anion gap: 10 (ref 5–15)
BUN: 15 mg/dL (ref 8–23)
CO2: 28 mmol/L (ref 22–32)
Calcium: 8.7 mg/dL — ABNORMAL LOW (ref 8.9–10.3)
Chloride: 95 mmol/L — ABNORMAL LOW (ref 98–111)
Creatinine, Ser: 5.82 mg/dL — ABNORMAL HIGH (ref 0.61–1.24)
GFR, Estimated: 10 mL/min — ABNORMAL LOW (ref >60)
Glucose, Bld: 184 mg/dL — ABNORMAL HIGH (ref 70–99)
Potassium: 3.6 mmol/L (ref 3.5–5.1)
Sodium: 133 mmol/L — ABNORMAL LOW (ref 135–145)

## 2021-04-16 LAB — CBC
HCT: 28.3 % — ABNORMAL LOW (ref 39.0–52.0)
Hemoglobin: 8.9 g/dL — ABNORMAL LOW (ref 13.0–17.0)
MCH: 33.1 pg (ref 26.0–34.0)
MCHC: 31.4 g/dL (ref 30.0–36.0)
MCV: 105.2 fL — ABNORMAL HIGH (ref 80.0–100.0)
Platelets: 164 10*3/uL (ref 150–400)
RBC: 2.69 MIL/uL — ABNORMAL LOW (ref 4.22–5.81)
RDW: 19.3 % — ABNORMAL HIGH (ref 11.5–15.5)
WBC: 5.1 10*3/uL (ref 4.0–10.5)
nRBC: 1.8 % — ABNORMAL HIGH (ref 0.0–0.2)

## 2021-04-16 LAB — HEPATITIS B SURFACE ANTIGEN: Hepatitis B Surface Ag: NONREACTIVE

## 2021-04-16 LAB — GLUCOSE, CAPILLARY
Glucose-Capillary: 197 mg/dL — ABNORMAL HIGH (ref 70–99)
Glucose-Capillary: 100 mg/dL — ABNORMAL HIGH (ref 70–99)
Glucose-Capillary: 148 mg/dL — ABNORMAL HIGH (ref 70–99)
Glucose-Capillary: 173 mg/dL — ABNORMAL HIGH (ref 70–99)
Glucose-Capillary: 163 mg/dL — ABNORMAL HIGH (ref 70–99)

## 2021-04-16 LAB — HEPATITIS B SURFACE ANTIBODY,QUALITATIVE: Hep B S Ab: REACTIVE — AB

## 2021-04-16 SURGERY — AMPUTATION BELOW KNEE
Anesthesia: General | Site: Knee | Laterality: Left

## 2021-04-16 MED ORDER — SODIUM CHLORIDE 0.9 % IV SOLN: INTRAVENOUS | Status: DC | PRN

## 2021-04-16 MED ORDER — PROPOFOL 10 MG/ML IV BOLUS
INTRAVENOUS | Status: DC | PRN
  Administered 2021-04-16: 200 mg via INTRAVENOUS

## 2021-04-16 MED ORDER — FENTANYL CITRATE (PF) 100 MCG/2ML IJ SOLN
INTRAMUSCULAR | Status: AC
  Administered 2021-04-16: 10:00:00 50 ug via INTRAVENOUS
  Filled 2021-04-16: qty 2

## 2021-04-16 MED ORDER — BACITRACIN ZINC 500 UNIT/GM EX OINT
TOPICAL_OINTMENT | CUTANEOUS | Status: DC | PRN
  Administered 2021-04-16: 1 via TOPICAL

## 2021-04-16 MED ORDER — OXYCODONE HCL 5 MG/5ML PO SOLN: 5.0000 mg | Freq: Once | ORAL | Status: AC | PRN

## 2021-04-16 MED ORDER — PROPOFOL 10 MG/ML IV BOLUS
INTRAVENOUS | Status: AC
  Filled 2021-04-16: qty 20

## 2021-04-16 MED ORDER — FENTANYL CITRATE (PF) 250 MCG/5ML IJ SOLN
INTRAMUSCULAR | Status: DC | PRN
  Administered 2021-04-16: 50 ug via INTRAVENOUS
  Administered 2021-04-16: 25 ug via INTRAVENOUS

## 2021-04-16 MED ORDER — HYDROMORPHONE HCL 1 MG/ML IJ SOLN
INTRAMUSCULAR | Status: AC
  Administered 2021-04-16: 11:00:00 0.5 mg via INTRAVENOUS
  Filled 2021-04-16: qty 1

## 2021-04-16 MED ORDER — AMISULPRIDE (ANTIEMETIC) 5 MG/2ML IV SOLN: 10.0000 mg | Freq: Once | INTRAVENOUS | Status: DC | PRN

## 2021-04-16 MED ORDER — ALTEPLASE 2 MG IJ SOLR: 2.0000 mg | Freq: Once | INTRAMUSCULAR | Status: DC | PRN

## 2021-04-16 MED ORDER — LIDOCAINE HCL (PF) 1 % IJ SOLN: 5.0000 mL | INTRAMUSCULAR | Status: DC | PRN

## 2021-04-16 MED ORDER — MIDAZOLAM HCL 2 MG/2ML IJ SOLN
INTRAMUSCULAR | Status: AC
  Filled 2021-04-16: qty 2

## 2021-04-16 MED ORDER — PHENYLEPHRINE HCL-NACL 20-0.9 MG/250ML-% IV SOLN
INTRAVENOUS | Status: DC | PRN
Start: 2021-04-16 — End: 2021-04-16
  Administered 2021-04-16: 15 ug/min via INTRAVENOUS

## 2021-04-16 MED ORDER — OXYCODONE HCL 5 MG PO TABS
ORAL_TABLET | ORAL | Status: AC
  Administered 2021-04-16: 11:00:00 5 mg via ORAL
  Filled 2021-04-16: qty 1

## 2021-04-16 MED ORDER — ONDANSETRON HCL 4 MG/2ML IJ SOLN
INTRAMUSCULAR | Status: DC | PRN
  Administered 2021-04-16: 4 mg via INTRAVENOUS

## 2021-04-16 MED ORDER — SODIUM CHLORIDE 0.9 % IV SOLN: 100.0000 mL | INTRAVENOUS | Status: DC | PRN

## 2021-04-16 MED ORDER — LIDOCAINE-PRILOCAINE 2.5-2.5 % EX CREA: 1.0000 "application " | TOPICAL_CREAM | CUTANEOUS | Status: DC | PRN

## 2021-04-16 MED ORDER — FENTANYL CITRATE (PF) 250 MCG/5ML IJ SOLN
INTRAMUSCULAR | Status: AC
  Filled 2021-04-16: qty 5

## 2021-04-16 MED ORDER — MEPERIDINE HCL 25 MG/ML IJ SOLN: 6.2500 mg | INTRAMUSCULAR | Status: DC | PRN

## 2021-04-16 MED ORDER — FENTANYL CITRATE (PF) 100 MCG/2ML IJ SOLN: 25.0000 ug | INTRAMUSCULAR | Status: DC | PRN

## 2021-04-16 MED ORDER — 0.9 % SODIUM CHLORIDE (POUR BTL) OPTIME
TOPICAL | Status: DC | PRN
  Administered 2021-04-16: 07:00:00 1000 mL

## 2021-04-16 MED ORDER — VANCOMYCIN HCL IN DEXTROSE 1-5 GM/200ML-% IV SOLN
1000.0000 mg | INTRAVENOUS | Status: AC
  Administered 2021-04-17: 1000 mg via INTRAVENOUS
  Filled 2021-04-16 (×2): qty 200

## 2021-04-16 MED ORDER — OXYCODONE HCL 5 MG PO TABS: 5.0000 mg | ORAL_TABLET | Freq: Once | ORAL | Status: AC | PRN

## 2021-04-16 MED ORDER — DEXAMETHASONE SODIUM PHOSPHATE 10 MG/ML IJ SOLN
INTRAMUSCULAR | Status: DC | PRN
  Administered 2021-04-16: 4 mg via INTRAVENOUS

## 2021-04-16 MED ORDER — PROMETHAZINE HCL 25 MG/ML IJ SOLN: 6.2500 mg | INTRAMUSCULAR | Status: DC | PRN

## 2021-04-16 MED ORDER — MIDAZOLAM HCL 2 MG/2ML IJ SOLN
INTRAMUSCULAR | Status: DC | PRN
Start: 2021-04-16 — End: 2021-04-16
  Administered 2021-04-16: 1 mg via INTRAVENOUS

## 2021-04-16 MED ORDER — PENTAFLUOROPROP-TETRAFLUOROETH EX AERO: 1.0000 "application " | INHALATION_SPRAY | CUTANEOUS | Status: DC | PRN

## 2021-04-16 MED ORDER — HYDROMORPHONE HCL 1 MG/ML IJ SOLN
0.2500 mg | INTRAMUSCULAR | Status: DC | PRN
  Administered 2021-04-16 (×2): 0.5 mg via INTRAVENOUS

## 2021-04-16 MED ORDER — KETOROLAC TROMETHAMINE 15 MG/ML IJ SOLN
15.0000 mg | Freq: Four times a day (QID) | INTRAMUSCULAR | Status: AC | PRN
  Administered 2021-04-16 – 2021-04-21 (×9): 15 mg via INTRAVENOUS
  Filled 2021-04-16 (×10): qty 1

## 2021-04-16 MED ORDER — FENTANYL CITRATE (PF) 100 MCG/2ML IJ SOLN
INTRAMUSCULAR | Status: AC
  Administered 2021-04-16: 11:00:00 50 ug via INTRAVENOUS
  Filled 2021-04-16: qty 2

## 2021-04-16 MED ORDER — HEPARIN SODIUM (PORCINE) 1000 UNIT/ML DIALYSIS: 1000.0000 [IU] | INTRAMUSCULAR | Status: DC | PRN

## 2021-04-16 MED ORDER — LIDOCAINE 2% (20 MG/ML) 5 ML SYRINGE
INTRAMUSCULAR | Status: DC | PRN
  Administered 2021-04-16: 60 mg via INTRAVENOUS

## 2021-04-16 MED ORDER — FENTANYL CITRATE (PF) 100 MCG/2ML IJ SOLN
25.0000 ug | INTRAMUSCULAR | Status: DC | PRN
  Administered 2021-04-16 (×2): 50 ug via INTRAVENOUS

## 2021-04-16 SURGICAL SUPPLY — 55 items
BAG COUNTER SPONGE SURGICOUNT (BAG) ×2 IMPLANT
BAG SPNG CNTER NS LX DISP (BAG) ×1
BAG SURGICOUNT SPONGE COUNTING (BAG) ×1
BANDAGE ESMARK 6X9 LF (GAUZE/BANDAGES/DRESSINGS) ×1 IMPLANT
BLADE SAW RECIP 87.9 MT (BLADE) ×5 IMPLANT
BNDG COHESIVE 6X5 TAN STRL LF (GAUZE/BANDAGES/DRESSINGS) ×3 IMPLANT
BNDG ELASTIC 4X5.8 VLCR STR LF (GAUZE/BANDAGES/DRESSINGS) ×4 IMPLANT
BNDG ESMARK 6X9 LF (GAUZE/BANDAGES/DRESSINGS) ×3
BNDG GAUZE ELAST 4 BULKY (GAUZE/BANDAGES/DRESSINGS) ×4 IMPLANT
CANISTER SUCT 3000ML PPV (MISCELLANEOUS) ×6 IMPLANT
CLIP VESOCCLUDE MED 6/CT (CLIP) IMPLANT
COVER SURGICAL LIGHT HANDLE (MISCELLANEOUS) ×3 IMPLANT
CUFF TOURN SGL QUICK 24 (TOURNIQUET CUFF)
CUFF TOURN SGL QUICK 34 (TOURNIQUET CUFF)
CUFF TOURN SGL QUICK 42 (TOURNIQUET CUFF) IMPLANT
CUFF TRNQT CYL 24X4X16.5-23 (TOURNIQUET CUFF) IMPLANT
CUFF TRNQT CYL 34X4.125X (TOURNIQUET CUFF) IMPLANT
DRAIN CHANNEL 19F RND (DRAIN) IMPLANT
DRAPE HALF SHEET 40X57 (DRAPES) ×3 IMPLANT
DRAPE INCISE IOBAN 66X45 STRL (DRAPES) ×3 IMPLANT
DRAPE ORTHO SPLIT 77X108 STRL (DRAPES) ×6
DRAPE SURG ORHT 6 SPLT 77X108 (DRAPES) ×2 IMPLANT
DRAPE U-SHAPE 47X51 STRL (DRAPES) ×3 IMPLANT
DRSG ADAPTIC 3X8 NADH LF (GAUZE/BANDAGES/DRESSINGS) ×3 IMPLANT
ELECT REM PT RETURN 9FT ADLT (ELECTROSURGICAL) ×3
ELECTRODE REM PT RTRN 9FT ADLT (ELECTROSURGICAL) ×1 IMPLANT
EVACUATOR SILICONE 100CC (DRAIN) IMPLANT
GAUZE SPONGE 4X4 12PLY STRL (GAUZE/BANDAGES/DRESSINGS) ×4 IMPLANT
GLOVE SRG 8 PF TXTR STRL LF DI (GLOVE) ×1 IMPLANT
GLOVE SURG ENC MOIS LTX SZ7.5 (GLOVE) ×3 IMPLANT
GLOVE SURG POLY ORTHO LF SZ7.5 (GLOVE) IMPLANT
GLOVE SURG UNDER LTX SZ8 (GLOVE) ×3 IMPLANT
GLOVE SURG UNDER POLY LF SZ8 (GLOVE) ×3
GOWN STRL REUS W/ TWL LRG LVL3 (GOWN DISPOSABLE) ×3 IMPLANT
GOWN STRL REUS W/TWL LRG LVL3 (GOWN DISPOSABLE) ×9
KIT BASIN OR (CUSTOM PROCEDURE TRAY) ×3 IMPLANT
KIT TURNOVER KIT B (KITS) ×3 IMPLANT
NS IRRIG 1000ML POUR BTL (IV SOLUTION) ×3 IMPLANT
PACK GENERAL/GYN (CUSTOM PROCEDURE TRAY) ×3 IMPLANT
PAD ARMBOARD 7.5X6 YLW CONV (MISCELLANEOUS) ×6 IMPLANT
RASP HELIOCORDIAL MED (MISCELLANEOUS) IMPLANT
SPONGE T-LAP 18X18 ~~LOC~~+RFID (SPONGE) ×4 IMPLANT
STAPLER VISISTAT (STAPLE) ×5 IMPLANT
STOCKINETTE IMPERVIOUS LG (DRAPES) ×3 IMPLANT
SUT ETHILON 3 0 PS 1 (SUTURE) IMPLANT
SUT SILK 0 TIES 10X30 (SUTURE) ×3 IMPLANT
SUT SILK 2 0 (SUTURE) ×3
SUT SILK 2 0 SH CR/8 (SUTURE) ×7 IMPLANT
SUT SILK 2-0 18XBRD TIE 12 (SUTURE) ×1 IMPLANT
SUT SILK 3 0 (SUTURE) ×3
SUT SILK 3-0 18XBRD TIE 12 (SUTURE) ×1 IMPLANT
SUT VIC AB 2-0 CT1 18 (SUTURE) ×7 IMPLANT
TOWEL GREEN STERILE (TOWEL DISPOSABLE) ×6 IMPLANT
UNDERPAD 30X36 HEAVY ABSORB (UNDERPADS AND DIAPERS) ×3 IMPLANT
WATER STERILE IRR 1000ML POUR (IV SOLUTION) ×3 IMPLANT

## 2021-04-16 NOTE — Progress Notes (Addendum)
[name] Progress Note   Subjective:    Seen and examined patient at bedside. Tolerated last HD overnight with net UF 3.5L. 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 Scot Dock. Patient reports post-operative pain at L stump. Plan for HD 04/17/21.   Objective Vitals:   04/16/21 1100 04/16/21 1115 04/16/21 1124 04/16/21 1140  BP: 130/68 125/71 111/70 110/67  Pulse: 89 91 92 96  Resp: 14 13 12 14   Temp:   (!) 97 F (36.1 C)   TempSrc:      SpO2: 99% 100% 97% 97%  Weight:       Physical Exam General: Appears uncomfortable; Alert; NAD Heart: S1 and S2; No murmurs, gallops, or rubs Lungs: Clear anteriorly; No wheezing, rales, or rhonchi Abdomen: Large, soft, non-tender, active bowel sounds Extremities:No edema RLE, L thigh, or L stump; ACE wrap applied LLE Dialysis Access: L AVF tapped; (+) Bruit/Thrill; Notable anuerysm-skin shinny and thin (assessed yesterday).  Filed Weights   04/15/21 2331 04/16/21 0331  Weight: 131.4 kg 128 kg    Intake/Output Summary (Last 24 hours) at 04/16/2021 1228 Last data filed at 04/16/2021 0931 Gross per 24 hour  Intake 400 ml  Output 3600 ml  Net -3200 ml    Additional Objective Labs: Basic Metabolic Panel: Recent Labs  Lab 04/14/21 0804 04/15/21 0508 04/16/21 0435  NA 136 135 133*  K 4.2 5.1 3.6  CL 96* 97* 95*  CO2 28 28 28   GLUCOSE 136* 157* 184*  BUN 22 29* 15  CREATININE 8.65* 10.64* 5.82*  CALCIUM 9.4 8.9 8.7*   Liver Function Tests: No results for input(s): AST, ALT, ALKPHOS, BILITOT, PROT, ALBUMIN in the last 168 hours. No results for input(s): LIPASE, AMYLASE in the last 168 hours. CBC: Recent Labs  Lab 04/14/21 0804 04/15/21 0508 04/16/21 0435  WBC 6.2 5.6 5.1  NEUTROABS 3.3  --   --   HGB 8.9* 9.0* 8.9*  HCT 28.9* 28.9* 28.3*  MCV 108.6* 109.1* 105.2*  PLT 203 195 164   Blood Culture    Component Value Date/Time   SDES BLOOD RIGHT HAND 04/14/2021 1035   SPECREQUEST  04/14/2021 1035    BOTTLES DRAWN  AEROBIC AND ANAEROBIC Blood Culture adequate volume   CULT  04/14/2021 1035    NO GROWTH 2 DAYS Performed at [facility], [place] [address] [place],  [address]    REPTSTATUS PENDING 04/14/2021 1035    Cardiac Enzymes: No results for input(s): CKTOTAL, CKMB, CKMBINDEX, TROPONINI in the last 168 hours. CBG: Recent Labs  Lab 04/15/21 1631 04/15/21 2014 04/16/21 0516 04/16/21 0950 04/16/21 1143  GLUCAP 154* 174* 197* 100* 148*   Iron Studies: No results for input(s): IRON, TIBC, TRANSFERRIN, FERRITIN in the last 72 hours. Lab Results  Component Value Date   INR 1.0 03/24/2021   INR 1.1 10/28/2020   INR 1.1 10/27/2020   Studies/Results: No results found.  Medications:  cefTRIAXone (ROCEPHIN)  IV Stopped (04/15/21 1101)   metronidazole 500 mg (04/16/21 0421)    amLODipine  10 mg Oral QHS   calcium acetate  1,334 mg Oral TID WC   Chlorhexidine Gluconate Cloth  6 each Topical Q0600   cloNIDine  0.1 mg Oral Once per day on Tue Thu Sat   cloNIDine  0.1 mg Oral 2 times per day on Sun Mon Wed Fri   gabapentin  100 mg Oral TID   heparin  5,000 Units Subcutaneous Q8H   insulin aspart  0-6 Units Subcutaneous TID  WC   nicotine  14 mg Transdermal Daily   pantoprazole  40 mg Oral BID   QUEtiapine  50 mg Oral QHS   vancomycin variable dose per unstable renal function (pharmacist dosing)   Does not apply See admin instructions    Dialysis Orders: TTS - [facility] 4hrs, BFR 450, DFR 500,  [id number], 2K/ 2Ca Mircera 225 mcg q2wks - last 04/06/21 No Systemic Heparin Hectorol 17mcg IV with HD-04/13/21 Venofer 100mg  IV X 6 doses-last 04/13/21  Assessment/Plan: Diabetic L foot infection/PVD-Underwent LE angiography 04/08/21 showed limb ischemia L leg. 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 Scot Dock. Continue ABX. ESRD - usual schedule is TTS. Tolerated HD overnight in preparation for L BKA today-net UF 3.5L. Plan for HD 04/17/21 off schedule. Patient with notable aneurysm with  shinny, thinning, and erythema at L AVF; (+) Bruit/Thrill; HD staff currently rotating site during cannulation. Discussed with Dr. Scot Dock yesterday and reached out to [facility] today to ensure this is evaluated during hospitalization. Hypertension/volume  - Patient euvolemic on exam. Bps stable. Continue current anti-hypertensive regimen Anemia of CKD - Hgb now 8.9, on 2 week ESA-dose not due yet. Fe held for now d/t current ABX regimen. Follow trends. Secondary Hyperparathyroidism - Ca ok, will check PO4 in AM.  Nutrition - Currently NPO; can advance to renal diet with fluid restrictions as tolerated once clinically stable.  Daniel Poet, NP [facility] 04/16/2021,12:28 PM  LOS: 2 days    I have seen and examined this patient and agree with plan and assessment in the above note with renal recommendations/intervention highlighted. In quite a bit of pain today.  Will get back on schedule Thursday. Daniel John A Min Collymore,MD 04/16/2021 2:01 PM

## 2021-04-16 NOTE — Anesthesia Procedure Notes (Signed)
Procedure Name: LMA Insertion Date/Time: 04/16/2021 7:40 AM Performed by: Clearnce Sorrel, CRNA Pre-anesthesia Checklist: Patient identified, Emergency Drugs available, Suction available and Patient being monitored Patient Re-evaluated:Patient Re-evaluated prior to induction Oxygen Delivery Method: Circle System Utilized Preoxygenation: Pre-oxygenation with 100% oxygen Induction Type: IV induction Ventilation: Mask ventilation without difficulty LMA: LMA inserted LMA Size: 4.0 Number of attempts: 1 Airway Equipment and Method: Bite block Placement Confirmation: positive ETCO2 Tube secured with: Tape Dental Injury: Teeth and Oropharynx as per pre-operative assessment

## 2021-04-16 NOTE — Plan of Care (Signed)
  Problem: Health Behavior/Discharge Planning: Goal: Ability to manage health-related needs will improve Outcome: Progressing   Problem: Clinical Measurements: Goal: Ability to maintain clinical measurements within normal limits will improve Outcome: Progressing Goal: Will remain free from infection Outcome: Progressing   

## 2021-04-16 NOTE — Op Note (Signed)
° ° °  NAME: Daniel [name]    MRN: [id number] DOB: 02-10-1961    DATE OF OPERATION: 04/16/2021  PREOP DIAGNOSIS:    Nonhealing wound left lower extremity with peripheral vascular disease  POSTOP DIAGNOSIS:    Same  PROCEDURE:    Left below the knee amputation  SURGEON: Judeth Cornfield. Scot Dock, MD  ASSIST: Delena Serve, RNFA  ANESTHESIA: General  EBL: 200 cc  INDICATIONS:    [name] is a 60 y.o. male who presented with a nonhealing wound of the left foot.  He underwent an arteriogram and had no options for revascularization.  He had single-vessel runoff via the peroneal artery only.  FINDINGS:   The tissue had good perfusion at this level.  TECHNIQUE:   The patient was taken to the operating room and received a general anesthetic.  The left leg was prepped and draped in the usual sterile fashion.  The circumference of the limb was measured 10 cm distal to the  tibial tuberosity and two thirds of this distance was used to mark the anterior skin flap.  A long posterior flap of equal length was marked.  A tourniquet had been placed on the upper thigh.  The leg was exsanguinated with an Esmarch bandage and the tourniquet inflated to 300 mmHg.  Given his severe calcific disease secondary to his diabetes and end-stage renal disease the tourniquet was not effective and therefore this was released.  Therefore the dissection was done through the skin with a knife and then through the subcutaneous tissue and muscle with electrocautery.  Arteries and veins were individually suture ligated with 2-0 silk ties.  The dissection was carried down to the tibia which was dissected free circumferentially.  The periosteum was elevated and the bone divided proximal to the level of skin division.  The anterior aspect of the bone was beveled.  The fibula was divided proximal to this.  The edges of the bone were rasped.  The wound was irrigated.  Additional hemostasis was obtained using  electrocautery and 2-0 silk ties.  The fascial layer was closed with interrupted 2-0 Vicryl's.  The skin was closed with staples.  A sterile dressing was applied.  The patient tolerated procedure well was transferred to recovery room in stable condition.  All needle and sponge counts were correct.  Given the complexity of the case a first assistant was necessary in order to expedient the procedure and safely perform the technical aspects of the operation.  Deitra Mayo, MD, FACS [facility]  DATE OF DICTATION:   04/16/2021

## 2021-04-16 NOTE — Transfer of Care (Signed)
Immediate Anesthesia Transfer of Care Note  Patient: Daniel [name]  Procedure(s) Performed: LEFT BELOW KNEE AMPUTATION (Left: Knee)  Patient Location: PACU  Anesthesia Type:General  Level of Consciousness: drowsy  Airway & Oxygen Therapy: Patient Spontanous Breathing and Patient connected to face mask oxygen  Post-op Assessment: Report given to RN and Post -op Vital signs reviewed and stable  Post vital signs: Reviewed and stable  Last Vitals:  Vitals Value Taken Time  BP 92/64 04/16/21 0952  Temp    Pulse 72 04/16/21 0954  Resp 13 04/16/21 0954  SpO2 100 % 04/16/21 0954  Vitals shown include unvalidated device data.  Last Pain:  Vitals:   04/16/21 0416  TempSrc:   PainSc: 10-Worst pain ever      Patients Stated Pain Goal: 1 (76/28/31 5176)  Complications: No notable events documented.

## 2021-04-16 NOTE — Anesthesia Postprocedure Evaluation (Signed)
Anesthesia Post Note  Patient: Leslye Peer Asmar  Procedure(s) Performed: LEFT BELOW KNEE AMPUTATION (Left: Knee)     Patient location during evaluation: PACU Anesthesia Type: General Level of consciousness: awake and alert Pain management: pain level controlled Vital Signs Assessment: post-procedure vital signs reviewed and stable Respiratory status: spontaneous breathing, nonlabored ventilation and respiratory function stable Cardiovascular status: blood pressure returned to baseline and stable Postop Assessment: no apparent nausea or vomiting Anesthetic complications: no   No notable events documented.  Last Vitals:  Vitals:   04/16/21 1115 04/16/21 1124  BP: 125/71 111/70  Pulse: 91 92  Resp: 13 12  Temp:    SpO2: 100% 97%    Last Pain:  Vitals:   04/16/21 1115  TempSrc:   PainSc: Asleep                 Lynda Rainwater

## 2021-04-16 NOTE — Progress Notes (Signed)
PROGRESS NOTE    Daniel [name]  JIR:[id number] DOB: 1960/12/02 DOA: 04/14/2021 PCP: [name], NP   Chief Complaint  Patient presents with   Foot Pain  Brief Narrative/Hospital Course: Daniel [name], 60 y.o. male with PMH of ESRD on TTS HD; DM; HTN; ICH in 2019; and  B/L LE PAD presenting with foot infection.  He was last admitted from 12/12-13 for PAD and had aortogram with cannulation of the R common femoral artery, he does not have revascularization options available and so was to f/u with Dr. Scot Dock in 1-2 weeks to discuss TMA vs. BKA now presents with worsening pain and swelling in his foot. In the ED afebrile vitals stable labs with creatinine 8.6, CRP 1.7 lactic acid 1.6, normal WBC count anemia 8.9 g, xray- are fractures through the bases of the third, fourth, and fifth proximal phalanges, unchanged since March 30, 2021 \\We  were consulted, placed on vancomycin,Flagyl and ceftriaxone and admitted for further management.  Subjective:  Seen and examined.  Mildly lethargic, complains of pain on the amputation site Patient underwent left below-knee amputation due to nonhealing wound of the left foot in the setting of poor circulation  Assessment & Plan:  Diabetic foot infection of the left foot Known bilateral lower extremity PVD-with unreconstructable disease in the left foot: VVS on  board, s/p left BKA 12/20. Cont on Flagyl/ceftriaxone. Plan per VVS  Essential hypertension: Controlled on Norvasc and clonidine  ESRD on HD TTS, nephro on board for HD per schedule Metabolic bone disease continue home meds, PhosLo monitor calcium phosphorus Anemia of renal disease: Hemoglobin is stable monitor, transfuse if less than 7 g. Recent Labs  Lab 04/14/21 0804 04/15/21 0508 04/16/21 0435  HGB 8.9* 9.0* 8.9*  HCT 28.9* 28.9* 28.3*    B5ZW with complications-neuropathy and nephropathy, recent A1c less than 6 , well-controlled on SSI. Continue Neurontin Recent  Labs  Lab 04/15/21 1631 04/15/21 2014 04/16/21 0516 04/16/21 0950 04/16/21 1143  GLUCAP 154* 174* 197* 100* 148*     Class II obesity BMI 39 will benefit weight loss, outpatient OSA eval  DVT prophylaxis: heparin injection 5,000 Units Start: 04/14/21 1400 Code Status:   Code Status: Full Code Family Communication: plan of care discussed with patient at bedside. Status is: Inpatient  Remains inpatient appropriate because: For ongoing management of left foot diabetic wound and need for vascular evaluation possible amputation Disposition: Currently not medically stable for discharge. Anticipated Disposition: TBD  Objective: Vitals last 24 hrs: Vitals:   04/16/21 1100 04/16/21 1115 04/16/21 1124 04/16/21 1140  BP: 130/68 125/71 111/70 110/67  Pulse: 89 91 92 96  Resp: 14 13 12 14   Temp:   (!) 97 F (36.1 C)   TempSrc:      SpO2: 99% 100% 97% 97%  Weight:       Weight change:   Intake/Output Summary (Last 24 hours) at 04/16/2021 1149 Last data filed at 04/16/2021 0931 Gross per 24 hour  Intake 593.77 ml  Output 3600 ml  Net -3006.23 ml   Net IO Since Admission: -2,112.29 mL [04/16/21 1149]   Physical Examination: General exam: Alert awake mild regarding,older than stated age, weak appearing. HEENT:Oral mucosa moist, Ear/Nose WNL grossly, dentition normal. Respiratory system: bilaterally diminished, no use of accessory muscle Cardiovascular system: S1 & S2 +, No JVD,. Gastrointestinal system: Abdomen soft,NT,ND, BS+ Nervous System:Alert, awake, moving extremities and grossly nonfocal Extremities: LLE in dressing post op,edema, distal peripheral pulses palpable.  Skin: No rashes,no icterus. MSK:  Normal muscle bulk,tone, power   Medications reviewed:  Scheduled Meds:  amLODipine  10 mg Oral QHS   calcium acetate  1,334 mg Oral TID WC   Chlorhexidine Gluconate Cloth  6 each Topical Q0600   cloNIDine  0.1 mg Oral Once per day on Tue Thu Sat   cloNIDine  0.1 mg Oral  2 times per day on Sun Mon Wed Fri   gabapentin  100 mg Oral TID   heparin  5,000 Units Subcutaneous Q8H   insulin aspart  0-6 Units Subcutaneous TID WC   nicotine  14 mg Transdermal Daily   pantoprazole  40 mg Oral BID   QUEtiapine  50 mg Oral QHS   vancomycin variable dose per unstable renal function (pharmacist dosing)   Does not apply See admin instructions   Continuous Infusions:  cefTRIAXone (ROCEPHIN)  IV Stopped (04/15/21 1101)   metronidazole 500 mg (04/16/21 0421)   Diet Order             Diet NPO time specified Except for: Sips with Meds  Diet effective midnight                   Weight change:   Wt Readings from Last 3 Encounters:  04/16/21 128 kg  04/09/21 127.2 kg  04/04/21 134.3 kg     Consultants:see note  Procedures:see note Antimicrobials: Anti-infectives (From admission, onward)    Start     Dose/Rate Route Frequency Ordered Stop   04/16/21 0200  vancomycin (VANCOCIN) IVPB 1000 mg/200 mL premix        1,000 mg 200 mL/hr over 60 Minutes Intravenous  Once 04/15/21 2103 04/16/21 0625   04/15/21 1220  vancomycin variable dose per unstable renal function (pharmacist dosing)         Does not apply See admin instructions 04/15/21 1220     04/14/21 1130  vancomycin (VANCOCIN) 2,500 mg in sodium chloride 0.9 % 500 mL IVPB        2,500 mg 250 mL/hr over 120 Minutes Intravenous  Once 04/14/21 1120 04/14/21 2006   04/14/21 1115  cefTRIAXone (ROCEPHIN) 2 g in sodium chloride 0.9 % 100 mL IVPB        2 g 200 mL/hr over 30 Minutes Intravenous Every 24 hours 04/14/21 1109 04/21/21 1114   04/14/21 1115  metroNIDAZOLE (FLAGYL) IVPB 500 mg        500 mg 100 mL/hr over 60 Minutes Intravenous Every 8 hours 04/14/21 1109 04/21/21 1114   04/14/21 0945  piperacillin-tazobactam (ZOSYN) IVPB 2.25 g  Status:  Discontinued        2.25 g 100 mL/hr over 30 Minutes Intravenous Every 8 hours 04/14/21 0940 04/14/21 1109      Culture/Microbiology    Component Value  Date/Time   SDES BLOOD RIGHT HAND 04/14/2021 1035   SPECREQUEST  04/14/2021 1035    BOTTLES DRAWN AEROBIC AND ANAEROBIC Blood Culture adequate volume   CULT  04/14/2021 1035    NO GROWTH 2 DAYS Performed at [facility], [place] [address], [place], [place] [address]    REPTSTATUS PENDING 04/14/2021 1035    Other culture-see note  Unresulted Labs (From admission, onward)     Start     Ordered   04/16/21 0500  Hepatitis B surface antibody,quantitative  Once,   R        04/16/21 0500   Pending  Basic metabolic panel  Daily,   R      Pending  Pending  CBC  Daily,   R      Pending   Pending  CBC  (heparin)  Once,   R       Comments: Baseline for heparin therapy IF NOT ALREADY DRAWN.  Notify MD if PLT < 100 K.    Pending   Pending  Creatinine, serum  (heparin)  Once,   R       Comments: Baseline for heparin therapy IF NOT ALREADY DRAWN.    Pending          Data Reviewed: I have personally reviewed following labs and imaging studies CBC: Recent Labs  Lab 04/14/21 0804 04/15/21 0508 04/16/21 0435  WBC 6.2 5.6 5.1  NEUTROABS 3.3  --   --   HGB 8.9* 9.0* 8.9*  HCT 28.9* 28.9* 28.3*  MCV 108.6* 109.1* 105.2*  PLT 203 195 563   Basic Metabolic Panel: Recent Labs  Lab 04/14/21 0804 04/15/21 0508 04/16/21 0435  NA 136 135 133*  K 4.2 5.1 3.6  CL 96* 97* 95*  CO2 28 28 28   GLUCOSE 136* 157* 184*  BUN 22 29* 15  CREATININE 8.65* 10.64* 5.82*  CALCIUM 9.4 8.9 8.7*   GFR: Estimated Creatinine Clearance: 18.2 mL/min (A) (by C-G formula based on SCr of 5.82 mg/dL (H)). Liver Function Tests: No results for input(s): AST, ALT, ALKPHOS, BILITOT, PROT, ALBUMIN in the last 168 hours. No results for input(s): LIPASE, AMYLASE in the last 168 hours. No results for input(s): AMMONIA in the last 168 hours. Coagulation Profile: No results for input(s): INR, PROTIME in the last 168 hours. Cardiac Enzymes: No results for input(s): CKTOTAL, CKMB, CKMBINDEX, TROPONINI in the  last 168 hours. BNP (last 3 results) No results for input(s): PROBNP in the last 8760 hours. HbA1C: No results for input(s): HGBA1C in the last 72 hours. CBG: Recent Labs  Lab 04/15/21 1631 04/15/21 2014 04/16/21 0516 04/16/21 0950 04/16/21 1143  GLUCAP 154* 174* 197* 100* 148*   Lipid Profile: No results for input(s): CHOL, HDL, LDLCALC, TRIG, CHOLHDL, LDLDIRECT in the last 72 hours. Thyroid Function Tests: No results for input(s): TSH, T4TOTAL, FREET4, T3FREE, THYROIDAB in the last 72 hours. Anemia Panel: No results for input(s): VITAMINB12, FOLATE, FERRITIN, TIBC, IRON, RETICCTPCT in the last 72 hours. Sepsis Labs: Recent Labs  Lab 04/14/21 0839  LATICACIDVEN 1.6    Recent Results (from the past 240 hour(s))  Resp Panel by RT-PCR (Flu A&B, Covid) Nasopharyngeal Swab     Status: None   Collection Time: 04/14/21 10:25 AM   Specimen: Nasopharyngeal Swab; Nasopharyngeal(NP) swabs in vial transport medium  Result Value Ref Range Status   SARS Coronavirus 2 by RT PCR NEGATIVE NEGATIVE Final    Comment: (NOTE) SARS-CoV-2 target nucleic acids are NOT DETECTED.  The SARS-CoV-2 RNA is generally detectable in upper respiratory specimens during the acute phase of infection. The lowest concentration of SARS-CoV-2 viral copies this assay can detect is 138 copies/mL. A negative result does not preclude SARS-Cov-2 infection and should not be used as the sole basis for treatment or other patient management decisions. A negative result may occur with  improper specimen collection/handling, submission of specimen other than nasopharyngeal swab, presence of viral mutation(s) within the areas targeted by this assay, and inadequate number of viral copies(<138 copies/mL). A negative result must be combined with clinical observations, patient history, and epidemiological information. The expected result is Negative.  Fact Sheet for Patients:   EntrepreneurPulse.com.au  Fact Sheet for Healthcare Providers:  IncredibleEmployment.be  This test is no t yet approved or cleared by the [place] and  has been authorized for detection and/or diagnosis of SARS-CoV-2 by FDA under an Emergency Use Authorization (EUA). This EUA will remain  in effect (meaning this test can be used) for the duration of the COVID-19 declaration under Section 564(b)(1) of the Act, 21 U.S.C.section 360bbb-3(b)(1), unless the authorization is terminated  or revoked sooner.       Influenza A by PCR NEGATIVE NEGATIVE Final   Influenza B by PCR NEGATIVE NEGATIVE Final    Comment: (NOTE) The Xpert Xpress SARS-CoV-2/FLU/RSV plus assay is intended as an aid in the diagnosis of influenza from Nasopharyngeal swab specimens and should not be used as a sole basis for treatment. Nasal washings and aspirates are unacceptable for Xpert Xpress SARS-CoV-2/FLU/RSV testing.  Fact Sheet for Patients: EntrepreneurPulse.com.au  Fact Sheet for Healthcare Providers: IncredibleEmployment.be  This test is not yet approved or cleared by the [place] FDA and has been authorized for detection and/or diagnosis of SARS-CoV-2 by FDA under an Emergency Use Authorization (EUA). This EUA will remain in effect (meaning this test can be used) for the duration of the COVID-19 declaration under Section 564(b)(1) of the Act, 21 U.S.C. section 360bbb-3(b)(1), unless the authorization is terminated or revoked.  Performed at [facility], [place] [address], [place], [place] [address]   Blood culture (routine x 2)     Status: None (Preliminary result)   Collection Time: 04/14/21 10:30 AM   Specimen: BLOOD  Result Value Ref Range Status   Specimen Description BLOOD RIGHT ANTECUBITAL  Final   Special Requests   Final    BOTTLES DRAWN AEROBIC AND ANAEROBIC Blood Culture adequate volume   Culture    Final    NO GROWTH 2 DAYS Performed at [facility], [place] [address] [place], [place] [address]    Report Status PENDING  Incomplete  Blood culture (routine x 2)     Status: None (Preliminary result)   Collection Time: 04/14/21 10:35 AM   Specimen: BLOOD RIGHT HAND  Result Value Ref Range Status   Specimen Description BLOOD RIGHT HAND  Final   Special Requests   Final    BOTTLES DRAWN AEROBIC AND ANAEROBIC Blood Culture adequate volume   Culture   Final    NO GROWTH 2 DAYS Performed at [facility], [place] [address], [place], [place] [address]    Report Status PENDING  Incomplete  Surgical pcr screen     Status: Abnormal   Collection Time: 04/15/21  4:48 PM   Specimen: Nasal Mucosa; Nasal Swab  Result Value Ref Range Status   MRSA, PCR NEGATIVE NEGATIVE Final   Staphylococcus aureus POSITIVE (A) NEGATIVE Final    Comment: (NOTE) The Xpert SA Assay (FDA approved for NASAL specimens in patients 60 years of age and older), is one component of a comprehensive surveillance program. It is not intended to diagnose infection nor to guide or monitor treatment. Performed at [facility], [place] [address] [place], [place] [address]      Radiology Studies: No results found.   LOS: 2 days   Antonieta Pert, MD [facility]  04/16/2021, 11:49 AM

## 2021-04-16 NOTE — Interval H&P Note (Signed)
History and Physical Interval Note:  04/16/2021 7:14 AM  Daniel [name]  has presented today for surgery, with the diagnosis of Critical limb ischemia.  The various methods of treatment have been discussed with the patient and family. After consideration of risks, benefits and other options for treatment, the patient has consented to  Procedure(s): LEFT BELOW KNEE AMPUTATION (Left) as a surgical intervention.  The patient's history has been reviewed, patient examined, no change in status, stable for surgery.  I have reviewed the patient's chart and labs.  Questions were answered to the patient's satisfaction.     Deitra Mayo

## 2021-04-16 NOTE — Anesthesia Preprocedure Evaluation (Signed)
Anesthesia Evaluation  Patient identified by MRN, date of birth, ID band Patient awake    Reviewed: Allergy & Precautions, NPO status , Patient's Chart, lab work & pertinent test results  Airway Mallampati: II  TM Distance: >3 FB Neck ROM: Full    Dental  (+) Dental Advisory Given, Missing, Poor Dentition,    Pulmonary Current Smoker and Patient abstained from smoking.,    Pulmonary exam normal breath sounds clear to auscultation       Cardiovascular hypertension, Pt. on medications + Peripheral Vascular Disease  Normal cardiovascular exam Rhythm:Regular Rate:Normal     Neuro/Psych negative neurological ROS     GI/Hepatic PUD, GERD  Medicated,(+)     substance abuse  alcohol use and cocaine use, Hepatitis -, C  Endo/Other  diabetes, Type 2Obesity   Renal/GU ESRF and DialysisRenal disease     Musculoskeletal negative musculoskeletal ROS (+)   Abdominal (+) + obese,   Peds  Hematology  (+) Blood dyscrasia (Thrombocytopenia), anemia ,   Anesthesia Other Findings Day of surgery medications reviewed with the patient.  Reproductive/Obstetrics                             Anesthesia Physical  Anesthesia Plan  ASA: 4  Anesthesia Plan: General   Post-op Pain Management:    Induction: Intravenous  PONV Risk Score and Plan: 1 and Treatment may vary due to age or medical condition, Ondansetron and Midazolam  Airway Management Planned: LMA and Oral ETT  Additional Equipment:   Intra-op Plan:   Post-operative Plan:   Informed Consent: I have reviewed the patients History and Physical, chart, labs and discussed the procedure including the risks, benefits and alternatives for the proposed anesthesia with the patient or authorized representative who has indicated his/her understanding and acceptance.     Dental advisory given  Plan Discussed with: CRNA  Anesthesia Plan Comments:          Anesthesia Quick Evaluation

## 2021-04-17 ENCOUNTER — Encounter (HOSPITAL_COMMUNITY): Payer: Self-pay | Admitting: Vascular Surgery

## 2021-04-17 DIAGNOSIS — E118 Type 2 diabetes mellitus with unspecified complications: Secondary | ICD-10-CM

## 2021-04-17 DIAGNOSIS — E11628 Type 2 diabetes mellitus with other skin complications: Secondary | ICD-10-CM | POA: Diagnosis not present

## 2021-04-17 DIAGNOSIS — I10 Essential (primary) hypertension: Secondary | ICD-10-CM

## 2021-04-17 DIAGNOSIS — L089 Local infection of the skin and subcutaneous tissue, unspecified: Secondary | ICD-10-CM | POA: Diagnosis not present

## 2021-04-17 DIAGNOSIS — D62 Acute posthemorrhagic anemia: Secondary | ICD-10-CM

## 2021-04-17 DIAGNOSIS — Z89512 Acquired absence of left leg below knee: Secondary | ICD-10-CM

## 2021-04-17 LAB — RENAL FUNCTION PANEL
Albumin: 2.2 g/dL — ABNORMAL LOW (ref 3.5–5.0)
Anion gap: 11 (ref 5–15)
BUN: 27 mg/dL — ABNORMAL HIGH (ref 8–23)
CO2: 24 mmol/L (ref 22–32)
Calcium: 8.4 mg/dL — ABNORMAL LOW (ref 8.9–10.3)
Chloride: 96 mmol/L — ABNORMAL LOW (ref 98–111)
Creatinine, Ser: 8.43 mg/dL — ABNORMAL HIGH (ref 0.61–1.24)
GFR, Estimated: 7 mL/min — ABNORMAL LOW (ref >60)
Glucose, Bld: 159 mg/dL — ABNORMAL HIGH (ref 70–99)
Phosphorus: 3.6 mg/dL (ref 2.5–4.6)
Potassium: 6 mmol/L — ABNORMAL HIGH (ref 3.5–5.1)
Sodium: 131 mmol/L — ABNORMAL LOW (ref 135–145)

## 2021-04-17 LAB — CBC WITH DIFFERENTIAL/PLATELET
Abs Immature Granulocytes: 0.07 10*3/uL (ref 0.00–0.07)
Basophils Absolute: 0 10*3/uL (ref 0.0–0.1)
Basophils Relative: 0 %
Eosinophils Absolute: 0 10*3/uL (ref 0.0–0.5)
Eosinophils Relative: 0 %
HCT: 21.2 % — ABNORMAL LOW (ref 39.0–52.0)
Hemoglobin: 6.7 g/dL — CL (ref 13.0–17.0)
Immature Granulocytes: 1 %
Lymphocytes Relative: 14 %
Lymphs Abs: 1.6 10*3/uL (ref 0.7–4.0)
MCH: 34.2 pg — ABNORMAL HIGH (ref 26.0–34.0)
MCHC: 31.6 g/dL (ref 30.0–36.0)
MCV: 108.2 fL — ABNORMAL HIGH (ref 80.0–100.0)
Monocytes Absolute: 1 10*3/uL (ref 0.1–1.0)
Monocytes Relative: 9 %
Neutro Abs: 8.5 10*3/uL — ABNORMAL HIGH (ref 1.7–7.7)
Neutrophils Relative %: 76 %
Platelets: 155 10*3/uL (ref 150–400)
RBC: 1.96 MIL/uL — ABNORMAL LOW (ref 4.22–5.81)
RDW: 19.9 % — ABNORMAL HIGH (ref 11.5–15.5)
WBC: 11.2 10*3/uL — ABNORMAL HIGH (ref 4.0–10.5)
nRBC: 1.5 % — ABNORMAL HIGH (ref 0.0–0.2)

## 2021-04-17 LAB — GLUCOSE, CAPILLARY
Glucose-Capillary: 165 mg/dL — ABNORMAL HIGH (ref 70–99)
Glucose-Capillary: 146 mg/dL — ABNORMAL HIGH (ref 70–99)
Glucose-Capillary: 136 mg/dL — ABNORMAL HIGH (ref 70–99)

## 2021-04-17 LAB — HEPATITIS B SURFACE ANTIBODY, QUANTITATIVE: Hep B S AB Quant (Post): 194.2 m[IU]/mL (ref >9.9)

## 2021-04-17 LAB — PREPARE RBC (CROSSMATCH)

## 2021-04-17 MED ORDER — ACETAMINOPHEN 325 MG PO TABS: 650.0000 mg | ORAL_TABLET | Freq: Once | ORAL | Status: DC

## 2021-04-17 MED ORDER — DIPHENHYDRAMINE HCL 50 MG/ML IJ SOLN
25.0000 mg | Freq: Once | INTRAMUSCULAR | Status: DC
  Filled 2021-04-17: qty 1

## 2021-04-17 MED ORDER — SODIUM CHLORIDE 0.9% IV SOLUTION: Freq: Once | INTRAVENOUS | Status: DC

## 2021-04-17 NOTE — Progress Notes (Signed)
PT Cancellation Note  Patient Details Name: Daniel [name] MRN: [id number] DOB: Jul 13, 1960   Cancelled Treatment:    Reason Eval/Treat Not Completed: Patient declined, no reason specified;Patient not medically ready.  Pt declined due to too much pain today. 04/17/2021  Ginger Carne., PT Acute Rehabilitation Services [phone]  (pager) [phone]  (office)   Tessie Fass Chrislyn Seedorf 04/17/2021, 4:45 PM

## 2021-04-17 NOTE — Progress Notes (Signed)
[facility]  Subjective  - left AV fistula with aneurysmal area of concern.   Objective (!) 131/50 85 (!) 97.5 F (36.4 C) (Temporal) 12 96% No intake or output data in the 24 hours ending 04/17/21 1125  aneurysmal area left UE over AV fistula. On HD currently with stick sites avoiding the area of concern Left Hand motor, sensation and pulse intact.  Palpable good non pulsatile thrill in fistula. No bleeding noted and no flow issues currently while on HD Skin is white in appearence and is adhered to underlying tissue/fistula. No erythema or edema surrounding the fistula.  Assessment/Planning: Left UE AV fistula with aneurysm The patient denise episodes of prolonged bleeding. He states the  aneurysm has been present for years and they have been avoiding sticking in that area for some time.   He denise pain.    The fistula is viable with a palpable thrill that is not pulsatile.  The skin has no ulcers over the aneurysm, no erythema or surrounding edema. Continue to avoid sticking the aneurysm.  May stick above or below.    One episode of prolonged bleeding 04/16/21    Roxy Horseman 04/17/2021 11:25 AM --  Laboratory Lab Results: Recent Labs    04/16/21 0435 04/17/21 0334  WBC 5.1 11.2*  HGB 8.9* 6.7*  HCT 28.3* 21.2*  PLT 164 155   BMET Recent Labs    04/16/21 0435 04/17/21 0334  NA 133* 131*  K 3.6 6.0*  CL 95* 96*  CO2 28 24  GLUCOSE 184* 159*  BUN 15 27*  CREATININE 5.82* 8.43*  CALCIUM 8.7* 8.4*    COAG Lab Results  Component Value Date   INR 1.0 03/24/2021   INR 1.1 10/28/2020   INR 1.1 10/27/2020   No results found for: PTT

## 2021-04-17 NOTE — Care Management Important Message (Signed)
Important Message  Patient Details  Name: Daniel [name] MRN: [id number] Date of Birth: May 20, 1960   Medicare Important Message Given:  Yes     Hannah Beat 04/17/2021, 1:47 PM

## 2021-04-17 NOTE — Progress Notes (Signed)
PROGRESS NOTE    Daniel [name]  TFT:[id number] DOB: [birth year] DOA: 04/14/2021 PCP: [name], NP    Brief Narrative/Hospital Course: Daniel [name], 60 y.o. male with PMH of ESRD on TTS HD; DM; HTN; ICH in 2019; and  B/L LE PAD presenting with foot infection.  He was last admitted from 12/12-13 for PAD and had aortogram with cannulation of the R common femoral artery, he does not have revascularization options available and so was to f/u with Dr. Scot Dock in 1-2 weeks to discuss TMA vs. BKA now presents with worsening pain and swelling in his foot. In the ED afebrile vitals stable labs with creatinine 8.6, CRP 1.7 lactic acid 1.6, normal WBC count anemia 8.9 g, xray- are fractures through the bases of the third, fourth, and fifth proximal phalanges, unchanged since March 30, 2021 \\We  were consulted, placed on vancomycin,Flagyl and ceftriaxone and admitted for further management.   Subjective: Patient mentions that the pain in his left lower extremity is not very well controlled.  Currently 8 out of 10 in intensity.  Denies any shortness of breath or chest pain.  No nausea vomiting   Assessment & Plan:  Diabetic foot infection of the left foot Bilateral lower extremity PVD Patient was seen by vascular surgery.  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  Currently remains on IV ceftriaxone and metronidazole.  Also noted to be on vancomycin.   Vascular surgery has ordered fentanyl PCA. Should be able to discontinue antibiotics after 48 hours.  Essential hypertension Blood pressure is reasonably well controlled.  Noted to be on amlodipine and clonidine.    ESRD on HD TTS Being dialyzed off schedule.  Nephrology is following.  Metabolic bone disease Continue home meds, PhosLo monitor calcium phosphorus  Anemia of renal disease Drop in hemoglobin noted.  No overt blood loss noted.  To be transfused 2 units of PRBC with HD.  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   Currently just on SSI. General in total.  Monitor CBGs.      Obesity Estimated body mass index is 38.34 kg/m as calculated from the following:   Height as of 04/08/21: 5\' 11"  (1.803 m).   Weight as of this encounter: 124.7 kg.  DVT prophylaxis: heparin injection 5,000 Units Start: 04/14/21 1400 Code Status:   Code Status: Full Code Family Communication: plan of care discussed with patient at bedside. Disposition: Be determined.  PT and OT evaluation.   Status is: Inpatient  Remains inpatient appropriate because: Status post left BKA.  Anemia.  End-stage renal disease.   Objective: Vitals last 24 hrs: Vitals:   04/17/21 0855 04/17/21 0857 04/17/21 0930 04/17/21 1000  BP: (!) 117/56 (!) 114/46 113/60 107/63  Pulse: 90 88 85 86  Resp: 14 13    Temp: 97.9 F (36.6 C)     TempSrc: Temporal     SpO2: 96%     Weight: 124.7 kg      Weight change:  No intake or output data in the 24 hours ending 04/17/21 1021  Net IO Since Admission: -2,112.29 mL [04/17/21 1021]   Physical Examination:  General appearance: Awake alert.  In no distress Resp: Clear to auscultation bilaterally.  Normal effort Cardio: S1-S2 is normal regular.  No S3-S4.  No rubs murmurs or bruit GI: Abdomen is soft.  Nontender nondistended.  Bowel sounds are present normal.  No masses organomegaly Extremities: No edema.   Neurologic: Alert  and oriented x3.  No focal neurological deficits.     Medications reviewed:  Scheduled Meds:  sodium chloride   Intravenous Once   acetaminophen  650 mg Oral Once   amLODipine  10 mg Oral QHS   calcium acetate  1,334 mg Oral TID WC   Chlorhexidine Gluconate Cloth  6 each Topical Q0600   cloNIDine  0.1 mg Oral Once per day on Tue Thu Sat   cloNIDine  0.1 mg Oral 2 times per day on Sun Mon Wed Fri   diphenhydrAMINE  25 mg Intravenous Once   gabapentin  100 mg Oral TID   heparin  5,000  Units Subcutaneous Q8H   insulin aspart  0-6 Units Subcutaneous TID WC   nicotine  14 mg Transdermal Daily   pantoprazole  40 mg Oral BID   QUEtiapine  50 mg Oral QHS   Continuous Infusions:  sodium chloride     sodium chloride     cefTRIAXone (ROCEPHIN)  IV Stopped (04/15/21 1101)   metronidazole 500 mg (04/17/21 0209)   vancomycin     Diet Order             Diet renal with fluid restriction Fluid restriction: 1200 mL Fluid; Room service appropriate? Yes; Fluid consistency: Thin  Diet effective now                   Weight change:   Wt Readings from Last 3 Encounters:  04/17/21 124.7 kg  04/09/21 127.2 kg  04/04/21 134.3 kg     Consultants:see note  Procedures:see note   Antimicrobials: Anti-infectives (From admission, onward)    Start     Dose/Rate Route Frequency Ordered Stop   04/17/21 1200  vancomycin (VANCOCIN) IVPB 1000 mg/200 mL premix        1,000 mg 200 mL/hr over 60 Minutes Intravenous Every Wed (Hemodialysis) 04/16/21 1339 04/17/21 2359   04/16/21 0200  vancomycin (VANCOCIN) IVPB 1000 mg/200 mL premix        1,000 mg 200 mL/hr over 60 Minutes Intravenous  Once 04/15/21 2103 04/16/21 0625   04/15/21 1220  vancomycin variable dose per unstable renal function (pharmacist dosing)  Status:  Discontinued         Does not apply See admin instructions 04/15/21 1220 04/17/21 0724   04/14/21 1130  vancomycin (VANCOCIN) 2,500 mg in sodium chloride 0.9 % 500 mL IVPB        2,500 mg 250 mL/hr over 120 Minutes Intravenous  Once 04/14/21 1120 04/14/21 2006   04/14/21 1115  cefTRIAXone (ROCEPHIN) 2 g in sodium chloride 0.9 % 100 mL IVPB        2 g 200 mL/hr over 30 Minutes Intravenous Every 24 hours 04/14/21 1109 04/17/21 2359   04/14/21 1115  metroNIDAZOLE (FLAGYL) IVPB 500 mg        500 mg 100 mL/hr over 60 Minutes Intravenous Every 8 hours 04/14/21 1109 04/17/21 2359   04/14/21 0945  piperacillin-tazobactam (ZOSYN) IVPB 2.25 g  Status:  Discontinued        2.25  g 100 mL/hr over 30 Minutes Intravenous Every 8 hours 04/14/21 0940 04/14/21 1109      Culture/Microbiology    Component Value Date/Time   SDES BLOOD RIGHT HAND 04/14/2021 1035   SPECREQUEST  04/14/2021 1035    BOTTLES DRAWN AEROBIC AND ANAEROBIC Blood Culture adequate volume   CULT  04/14/2021 1035    NO GROWTH 2 DAYS Performed at [facility], [place]  [address], [place], [place] [address]    REPTSTATUS PENDING 04/14/2021 1035    Other culture-see note  Unresulted Labs (From admission, onward)     Start     Ordered   Pending  Basic metabolic panel  Daily,   R      Pending   Pending  CBC  Daily,   R      Pending   Pending  CBC  (heparin)  Once,   R       Comments: Baseline for heparin therapy IF NOT ALREADY DRAWN.  Notify MD if PLT < 100 K.    Pending   Pending  Creatinine, serum  (heparin)  Once,   R       Comments: Baseline for heparin therapy IF NOT ALREADY DRAWN.    Pending            Data Reviewed: I have personally reviewed following labs and imaging studies  CBC: Recent Labs  Lab 04/14/21 0804 04/15/21 0508 04/16/21 0435 04/17/21 0334  WBC 6.2 5.6 5.1 11.2*  NEUTROABS 3.3  --   --  8.5*  HGB 8.9* 9.0* 8.9* 6.7*  HCT 28.9* 28.9* 28.3* 21.2*  MCV 108.6* 109.1* 105.2* 108.2*  PLT 203 195 164 662    Basic Metabolic Panel: Recent Labs  Lab 04/14/21 0804 04/15/21 0508 04/16/21 0435 04/17/21 0334  NA 136 135 133* 131*  K 4.2 5.1 3.6 6.0*  CL 96* 97* 95* 96*  CO2 28 28 28 24   GLUCOSE 136* 157* 184* 159*  BUN 22 29* 15 27*  CREATININE 8.65* 10.64* 5.82* 8.43*  CALCIUM 9.4 8.9 8.7* 8.4*  PHOS  --   --   --  3.6    GFR: Estimated Creatinine Clearance: 12.4 mL/min (A) (by C-G formula based on SCr of 8.43 mg/dL (H)). Liver Function Tests: Recent Labs  Lab 04/17/21 0334  ALBUMIN 2.2*    CBG: Recent Labs  Lab 04/16/21 0950 04/16/21 1143 04/16/21 1558 04/16/21 2011 04/17/21 0625  GLUCAP 100* 148* 173* 163* 165*     Recent  Results (from the past 240 hour(s))  Resp Panel by RT-PCR (Flu A&B, Covid) Nasopharyngeal Swab     Status: None   Collection Time: 04/14/21 10:25 AM   Specimen: Nasopharyngeal Swab; Nasopharyngeal(NP) swabs in vial transport medium  Result Value Ref Range Status   SARS Coronavirus 2 by RT PCR NEGATIVE NEGATIVE Final    Comment: (NOTE) SARS-CoV-2 target nucleic acids are NOT DETECTED.  The SARS-CoV-2 RNA is generally detectable in upper respiratory specimens during the acute phase of infection. The lowest concentration of SARS-CoV-2 viral copies this assay can detect is 138 copies/mL. A negative result does not preclude SARS-Cov-2 infection and should not be used as the sole basis for treatment or other patient management decisions. A negative result may occur with  improper specimen collection/handling, submission of specimen other than nasopharyngeal swab, presence of viral mutation(s) within the areas targeted by this assay, and inadequate number of viral copies(<138 copies/mL). A negative result must be combined with clinical observations, patient history, and epidemiological information. The expected result is Negative.  Fact Sheet for Patients:  EntrepreneurPulse.com.au  Fact Sheet for Healthcare Providers:  IncredibleEmployment.be  This test is no t yet approved or cleared by the [place] FDA and  has been authorized for detection and/or diagnosis of SARS-CoV-2 by FDA under an Emergency Use Authorization (EUA). This EUA will remain  in effect (meaning this test can be used) for the duration of the  COVID-19 declaration under Section 564(b)(1) of the Act, 21 U.S.C.section 360bbb-3(b)(1), unless the authorization is terminated  or revoked sooner.       Influenza A by PCR NEGATIVE NEGATIVE Final   Influenza B by PCR NEGATIVE NEGATIVE Final    Comment: (NOTE) The Xpert Xpress SARS-CoV-2/FLU/RSV plus assay is intended as an aid in the  diagnosis of influenza from Nasopharyngeal swab specimens and should not be used as a sole basis for treatment. Nasal washings and aspirates are unacceptable for Xpert Xpress SARS-CoV-2/FLU/RSV testing.  Fact Sheet for Patients: EntrepreneurPulse.com.au  Fact Sheet for Healthcare Providers: IncredibleEmployment.be  This test is not yet approved or cleared by the [place] FDA and has been authorized for detection and/or diagnosis of SARS-CoV-2 by FDA under an Emergency Use Authorization (EUA). This EUA will remain in effect (meaning this test can be used) for the duration of the COVID-19 declaration under Section 564(b)(1) of the Act, 21 U.S.C. section 360bbb-3(b)(1), unless the authorization is terminated or revoked.  Performed at [facility], [place] [address] [place], [place] [address]   Blood culture (routine x 2)     Status: None (Preliminary result)   Collection Time: 04/14/21 10:30 AM   Specimen: BLOOD  Result Value Ref Range Status   Specimen Description BLOOD RIGHT ANTECUBITAL  Final   Special Requests   Final    BOTTLES DRAWN AEROBIC AND ANAEROBIC Blood Culture adequate volume   Culture   Final    NO GROWTH 2 DAYS Performed at [facility], [place] [address] [place], [place] [address]    Report Status PENDING  Incomplete  Blood culture (routine x 2)     Status: None (Preliminary result)   Collection Time: 04/14/21 10:35 AM   Specimen: BLOOD RIGHT HAND  Result Value Ref Range Status   Specimen Description BLOOD RIGHT HAND  Final   Special Requests   Final    BOTTLES DRAWN AEROBIC AND ANAEROBIC Blood Culture adequate volume   Culture   Final    NO GROWTH 2 DAYS Performed at [facility], [place] [address], [place], [place] [address]    Report Status PENDING  Incomplete  Surgical pcr screen     Status: Abnormal   Collection Time: 04/15/21  4:48 PM   Specimen: Nasal Mucosa; Nasal Swab  Result Value Ref Range  Status   MRSA, PCR NEGATIVE NEGATIVE Final   Staphylococcus aureus POSITIVE (A) NEGATIVE Final    Comment: (NOTE) The Xpert SA Assay (FDA approved for NASAL specimens in patients 97 years of age and older), is one component of a comprehensive surveillance program. It is not intended to diagnose infection nor to guide or monitor treatment. Performed at [facility], [place] [address], [place], [place] [address]       Radiology Studies: No results found.   LOS: 3 days   Bonnielee Haff, MD [facility]  04/17/2021, 10:21 AM

## 2021-04-17 NOTE — Progress Notes (Signed)
° °  VASCULAR SURGERY ASSESSMENT & PLAN:   POD 1 LEFT BKA: The dressing is dry.  We will change the dressing tomorrow.  He complains of significant pain and I have ordered a fentanyl PCA.  ACUTE BLOOD LOSS ANEMIA: His hemoglobin this morning is 6.7.  I would favor transfusion during his next dialysis if medical service agrees.  DVT PROPHYLAXIS: He is on subcu heparin.  SUBJECTIVE:   Moderate discomfort and amputation site.  PHYSICAL EXAM:   Vitals:   04/16/21 1124 04/16/21 1140 04/16/21 2009 04/17/21 0401  BP: 111/70 110/67 104/60 104/82  Pulse: 92 96 99 89  Resp: 12 14 17 20   Temp: (!) 97 F (36.1 C)  98.2 F (36.8 C) 98.4 F (36.9 C)  TempSrc:   Oral Oral  SpO2: 97% 97% 98% 100%  Weight:       His dressing on his left below the knee amputation site is dry.  LABS:   Lab Results  Component Value Date   WBC 11.2 (H) 04/17/2021   HGB 6.7 (LL) 04/17/2021   HCT 21.2 (L) 04/17/2021   MCV 108.2 (H) 04/17/2021   PLT 155 04/17/2021   Lab Results  Component Value Date   INR 1.0 03/24/2021   CBG (last 3)  Recent Labs    04/16/21 1558 04/16/21 2011 04/17/21 0625  GLUCAP 173* 163* 165*    PROBLEM LIST:    Principal Problem:   Diabetic foot infection ([place]) Active Problems:   Essential hypertension   End-stage renal disease on hemodialysis ([place])   Diabetes mellitus type 2, controlled, with complications ([place])   Class 2 obesity due to excess calories with body mass index (BMI) of 39.0 to 39.9 in adult   PAD (peripheral artery disease) ([facility])   CURRENT MEDS:    amLODipine  10 mg Oral QHS   calcium acetate  1,334 mg Oral TID WC   Chlorhexidine Gluconate Cloth  6 each Topical Q0600   cloNIDine  0.1 mg Oral Once per day on Tue Thu Sat   cloNIDine  0.1 mg Oral 2 times per day on Sun Mon Wed Fri   gabapentin  100 mg Oral TID   heparin  5,000 Units Subcutaneous Q8H   insulin aspart  0-6 Units Subcutaneous TID WC   nicotine  14 mg Transdermal Daily   pantoprazole  40  mg Oral BID   QUEtiapine  50 mg Oral QHS   vancomycin variable dose per unstable renal function (pharmacist dosing)   Does not apply See admin instructions    Deitra Mayo Office: [phone] 04/17/2021

## 2021-04-17 NOTE — Progress Notes (Signed)
Pt receives out-pt HD at [facility] ([facility]) on TTS. Pt arrives at 6:45 for 7:10 chair time. Will assist as needed.  Melven Sartorius Renal Navigator [phone]

## 2021-04-17 NOTE — Progress Notes (Signed)
Patient ID: Daniel [name], male   DOB: 1961-04-06, 60 y.o.   MRN: [id number] S: Reports that he was bleeding from AVF last night. O:BP (!) 130/55 (BP Location: Right Leg)    Pulse 90    Temp 98 F (36.7 C)    Resp 17    Wt 128 kg    SpO2 100%    BMI 39.36 kg/m   Intake/Output Summary (Last 24 hours) at 04/17/2021 0848 Last data filed at 04/16/2021 0931 Gross per 24 hour  Intake 150 ml  Output 50 ml  Net 100 ml   Intake/Output: I/O last 3 completed shifts: In: 400 [I.V.:400] Out: 3600 [Other:3500; Blood:100]  Intake/Output this shift:  No intake/output data recorded. Weight change:  Gen: NAD CVS: RRR Resp: CTA Abd: +BS, soft, NT/ND Ext: no edema s/p LBKA, LUE AVF +T/B  Recent Labs  Lab 04/14/21 0804 04/15/21 0508 04/16/21 0435 04/17/21 0334  NA 136 135 133* 131*  K 4.2 5.1 3.6 6.0*  CL 96* 97* 95* 96*  CO2 28 28 28 24   GLUCOSE 136* 157* 184* 159*  BUN 22 29* 15 27*  CREATININE 8.65* 10.64* 5.82* 8.43*  ALBUMIN  --   --   --  2.2*  CALCIUM 9.4 8.9 8.7* 8.4*  PHOS  --   --   --  3.6   Liver Function Tests: Recent Labs  Lab 04/17/21 0334  ALBUMIN 2.2*   No results for input(s): LIPASE, AMYLASE in the last 168 hours. No results for input(s): AMMONIA in the last 168 hours. CBC: Recent Labs  Lab 04/14/21 0804 04/15/21 0508 04/16/21 0435 04/17/21 0334  WBC 6.2 5.6 5.1 11.2*  NEUTROABS 3.3  --   --  8.5*  HGB 8.9* 9.0* 8.9* 6.7*  HCT 28.9* 28.9* 28.3* 21.2*  MCV 108.6* 109.1* 105.2* 108.2*  PLT 203 195 164 155   Cardiac Enzymes: No results for input(s): CKTOTAL, CKMB, CKMBINDEX, TROPONINI in the last 168 hours. CBG: Recent Labs  Lab 04/16/21 0950 04/16/21 1143 04/16/21 1558 04/16/21 2011 04/17/21 0625  GLUCAP 100* 148* 173* 163* 165*    Iron Studies: No results for input(s): IRON, TIBC, TRANSFERRIN, FERRITIN in the last 72 hours. Studies/Results: No results found.  sodium chloride   Intravenous Once   acetaminophen  650 mg Oral Once    amLODipine  10 mg Oral QHS   calcium acetate  1,334 mg Oral TID WC   Chlorhexidine Gluconate Cloth  6 each Topical Q0600   cloNIDine  0.1 mg Oral Once per day on Tue Thu Sat   cloNIDine  0.1 mg Oral 2 times per day on Sun Mon Wed Fri   diphenhydrAMINE  25 mg Intravenous Once   gabapentin  100 mg Oral TID   heparin  5,000 Units Subcutaneous Q8H   insulin aspart  0-6 Units Subcutaneous TID WC   nicotine  14 mg Transdermal Daily   pantoprazole  40 mg Oral BID   QUEtiapine  50 mg Oral QHS    BMET    Component Value Date/Time   NA 131 (L) 04/17/2021 0334   NA 138 08/22/2020 1141   K 6.0 (H) 04/17/2021 0334   CL 96 (L) 04/17/2021 0334   CO2 24 04/17/2021 0334   GLUCOSE 159 (H) 04/17/2021 0334   BUN 27 (H) 04/17/2021 0334   BUN 20 08/22/2020 1141   CREATININE 8.43 (H) 04/17/2021 0334   CALCIUM 8.4 (L) 04/17/2021 0334   CALCIUM 7.9 (L) 08/01/2019 8127   GFRNONAA  7 (L) 04/17/2021 0334   GFRAA 7 (L) 12/28/2019 1640   CBC    Component Value Date/Time   WBC 11.2 (H) 04/17/2021 0334   RBC 1.96 (L) 04/17/2021 0334   HGB 6.7 (LL) 04/17/2021 0334   HGB 8.4 (L) 08/22/2020 1141   HCT 21.2 (L) 04/17/2021 0334   HCT 25.0 (L) 08/22/2020 1141   PLT 155 04/17/2021 0334   PLT 184 08/22/2020 1141   MCV 108.2 (H) 04/17/2021 0334   MCV 94 08/22/2020 1141   MCH 34.2 (H) 04/17/2021 0334   MCHC 31.6 04/17/2021 0334   RDW 19.9 (H) 04/17/2021 0334   RDW 16.7 (H) 08/22/2020 1141   LYMPHSABS 1.6 04/17/2021 0334   LYMPHSABS 1.8 08/22/2020 1141   MONOABS 1.0 04/17/2021 0334   EOSABS 0.0 04/17/2021 0334   EOSABS 0.3 08/22/2020 1141   BASOSABS 0.0 04/17/2021 0334   BASOSABS 0.0 08/22/2020 1141    Dialysis Orders: TTS - [place] 4hrs, BFR 450, DFR 500,  [id number], 2K/ 2Ca Mircera 225 mcg q2wks - last 04/06/21 No Systemic Heparin Hectorol 23mcg IV with HD-04/13/21 Venofer 100mg  IV X 6 doses-last 04/13/21   Assessment/Plan: Diabetic L foot infection/PVD-Underwent LE angiography  04/08/21 showed limb ischemia L leg. 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 Scot Dock. Continue ABX. ESRD - usual schedule is TTS. Tolerated HD overnight in preparation for L BKA today-net UF 3.5L. Plan for HD 04/17/21 off schedule. Patient with notable aneurysm with shinny, thinning, and erythema at L AVF; (+) Bruit/Thrill; HD staff currently rotating site during cannulation. Discussed with Dr. Scot Dock 04/15/21 and reached out to [facility] 04/16/21 to ensure this is evaluated during hospitalization.  Will put in for IR consult for fistulogram if [facility] cannot schedule procedure.  Hypertension/volume  - Patient euvolemic on exam. Bps stable. Continue current anti-hypertensive regimen Anemia of CKD - Fe held for now d/t current ABX regimen.  Hgb down to 6.7 so will transfuse 2 units with HD today. Secondary Hyperparathyroidism - Ca ok, will check PO4 in AM.  Nutrition - Currently NPO; can advance to renal diet with fluid restrictions as tolerated once clinically stable.  Donetta Potts, MD [employer] [phone]

## 2021-04-17 NOTE — Progress Notes (Signed)
Patient Hgb was 6.7 per Lab this morning attending doctor is aware, plan to give blood during HD section, report given to HD nurse already and consent signed as well.will continue to monitor

## 2021-04-18 DIAGNOSIS — T82898A Other specified complication of vascular prosthetic devices, implants and grafts, initial encounter: Secondary | ICD-10-CM

## 2021-04-18 DIAGNOSIS — E118 Type 2 diabetes mellitus with unspecified complications: Secondary | ICD-10-CM | POA: Diagnosis not present

## 2021-04-18 DIAGNOSIS — L089 Local infection of the skin and subcutaneous tissue, unspecified: Secondary | ICD-10-CM | POA: Diagnosis not present

## 2021-04-18 DIAGNOSIS — I10 Essential (primary) hypertension: Secondary | ICD-10-CM | POA: Diagnosis not present

## 2021-04-18 DIAGNOSIS — E11628 Type 2 diabetes mellitus with other skin complications: Secondary | ICD-10-CM | POA: Diagnosis not present

## 2021-04-18 LAB — GLUCOSE, CAPILLARY
Glucose-Capillary: 133 mg/dL — ABNORMAL HIGH (ref 70–99)
Glucose-Capillary: 123 mg/dL — ABNORMAL HIGH (ref 70–99)
Glucose-Capillary: 127 mg/dL — ABNORMAL HIGH (ref 70–99)
Glucose-Capillary: 150 mg/dL — ABNORMAL HIGH (ref 70–99)

## 2021-04-18 LAB — CBC
HCT: 24.1 % — ABNORMAL LOW (ref 39.0–52.0)
Hemoglobin: 7.8 g/dL — ABNORMAL LOW (ref 13.0–17.0)
MCH: 32.8 pg (ref 26.0–34.0)
MCHC: 32.4 g/dL (ref 30.0–36.0)
MCV: 101.3 fL — ABNORMAL HIGH (ref 80.0–100.0)
Platelets: 156 10*3/uL (ref 150–400)
RBC: 2.38 MIL/uL — ABNORMAL LOW (ref 4.22–5.81)
RDW: 22.5 % — ABNORMAL HIGH (ref 11.5–15.5)
WBC: 9.1 10*3/uL (ref 4.0–10.5)
nRBC: 2.5 % — ABNORMAL HIGH (ref 0.0–0.2)

## 2021-04-18 LAB — RENAL FUNCTION PANEL
Albumin: 2.5 g/dL — ABNORMAL LOW (ref 3.5–5.0)
Anion gap: 10 (ref 5–15)
BUN: 23 mg/dL (ref 8–23)
CO2: 28 mmol/L (ref 22–32)
Calcium: 8.5 mg/dL — ABNORMAL LOW (ref 8.9–10.3)
Chloride: 97 mmol/L — ABNORMAL LOW (ref 98–111)
Creatinine, Ser: 7.03 mg/dL — ABNORMAL HIGH (ref 0.61–1.24)
GFR, Estimated: 8 mL/min — ABNORMAL LOW (ref >60)
Glucose, Bld: 132 mg/dL — ABNORMAL HIGH (ref 70–99)
Phosphorus: 2.5 mg/dL (ref 2.5–4.6)
Potassium: 3.9 mmol/L (ref 3.5–5.1)
Sodium: 135 mmol/L (ref 135–145)

## 2021-04-18 LAB — SURGICAL PATHOLOGY

## 2021-04-18 MED ORDER — RENA-VITE PO TABS
1.0000 | ORAL_TABLET | Freq: Every day | ORAL | Status: DC
  Administered 2021-04-18 – 2021-04-24 (×7): 1 via ORAL
  Filled 2021-04-18 (×7): qty 1

## 2021-04-18 MED ORDER — NEPRO/CARBSTEADY PO LIQD
237.0000 mL | Freq: Two times a day (BID) | ORAL | Status: DC
  Administered 2021-04-18 – 2021-04-20 (×3): 237 mL via ORAL

## 2021-04-18 NOTE — Progress Notes (Signed)
PROGRESS NOTE    Daniel [name]  HWE:[id number] DOB: 07-27-1960 DOA: 04/14/2021 PCP: [name], NP    Brief Narrative/Hospital Course: Daniel [name], 60 y.o. male with PMH of ESRD on TTS HD; DM; HTN; ICH in 2019; and  B/L LE PAD presenting with foot infection.  He was last admitted from 12/12-13 for PAD and had aortogram with cannulation of the R common femoral artery, he does not have revascularization options available and so was to f/u with Dr. Scot Dock in 1-2 weeks to discuss TMA vs. BKA now presents with worsening pain and swelling in his foot. In the ED afebrile vitals stable labs with creatinine 8.6, CRP 1.7 lactic acid 1.6, normal WBC count anemia 8.9 g, xray- are fractures through the bases of the third, fourth, and fifth proximal phalanges, unchanged since March 30, 2021 \\We  were consulted, placed on vancomycin,Flagyl and ceftriaxone and admitted for further management.   Subjective: Patient mentions that pain is better controlled in the left lower extremity compared to yesterday.  Denies any shortness of breath or chest pain.  No nausea vomiting.     Assessment & Plan:  Diabetic foot infection of the left foot Bilateral lower extremity PVD Patient was seen by vascular surgery.  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   Patient was on vancomycin ceftriaxone and metronidazole.  These appear to have been discontinued.   Pain is reasonably well controlled at this time.  Essential hypertension Remains on amlodipine and clonidine.  Blood pressure is well controlled.  ESRD on HD TTS Being dialyzed off schedule.  Nephrology is following. 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  Plan is for plication of this aneurysm tomorrow.  Metabolic bone disease Continue home meds, PhosLo monitor calcium phosphorus  Anemia of renal disease Patient was transfused 2 units of PRBC with hemodialysis on 12/21.  Monitor hemoglobin.  No overt  bleeding noted.  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   Just on SSI.  Continue to monitor.    Obesity Estimated body mass index is 37.97 kg/m as calculated from the following:   Height as of 04/08/21: 5\' 11"  (1.803 m).   Weight as of this encounter: 123.5 kg.   DVT prophylaxis: heparin injection 5,000 Units Start: 04/14/21 1400 Code Status:   Code Status: Full Code Family Communication: Discussed with patient Disposition: To be determined.  PT and OT evaluation.   Status is: Inpatient  Remains inpatient appropriate because: Status post left BKA.  Anemia.  End-stage renal disease.   Objective: Vitals last 24 hrs: Vitals:   04/17/21 1230 04/17/21 1245 04/17/21 1938 04/18/21 0745  BP: (!) 121/44 (!) 120/51 (!) 124/51 136/63  Pulse: 86 81 94 85  Resp:  19 20 16   Temp:  (!) 97.5 F (36.4 C) 98.3 F (36.8 C)   TempSrc:  Temporal Oral   SpO2:  97% 100% 100%  Weight:  123.5 kg     Weight change:   Intake/Output Summary (Last 24 hours) at 04/18/2021 1053 Last data filed at 04/17/2021 1437 Gross per 24 hour  Intake 150 ml  Output 1000 ml  Net -850 ml    Net IO Since Admission: -2,962.29 mL [04/18/21 1053]    Physical Examination:  General appearance: Awake alert.  In no distress Resp: Clear to auscultation bilaterally.  Normal effort Cardio: S1-S2 is normal regular.  No S3-S4.  No rubs murmurs or bruit GI: Abdomen is  soft.  Nontender nondistended.  Bowel sounds are present normal.  No masses organomegaly Extremities: Left lower extremity covered in dressing Neurologic: Alert and oriented x3.  No focal neurological deficits.      Medications reviewed:  Scheduled Meds:  sodium chloride   Intravenous Once   acetaminophen  650 mg Oral Once   amLODipine  10 mg Oral QHS   calcium acetate  1,334 mg Oral TID WC   Chlorhexidine Gluconate Cloth  6 each Topical Q0600   cloNIDine  0.1 mg Oral Once  per day on Tue Thu Sat   cloNIDine  0.1 mg Oral 2 times per day on Sun Mon Wed Fri   diphenhydrAMINE  25 mg Intravenous Once   feeding supplement (NEPRO CARB STEADY)  237 mL Oral BID BM   gabapentin  100 mg Oral TID   heparin  5,000 Units Subcutaneous Q8H   insulin aspart  0-6 Units Subcutaneous TID WC   multivitamin  1 tablet Oral QHS   nicotine  14 mg Transdermal Daily   pantoprazole  40 mg Oral BID   QUEtiapine  50 mg Oral QHS   Continuous Infusions:   Diet Order             Diet NPO time specified Except for: Sips with Meds  Diet effective midnight           Diet renal with fluid restriction Fluid restriction: 1200 mL Fluid; Room service appropriate? Yes; Fluid consistency: Thin  Diet effective now                   Weight change:   Wt Readings from Last 3 Encounters:  04/17/21 123.5 kg  04/09/21 127.2 kg  04/04/21 134.3 kg     Consultants:see note  Procedures:see note   Antimicrobials: Anti-infectives (From admission, onward)    Start     Dose/Rate Route Frequency Ordered Stop   04/17/21 1200  vancomycin (VANCOCIN) IVPB 1000 mg/200 mL premix        1,000 mg 200 mL/hr over 60 Minutes Intravenous Every Wed (Hemodialysis) 04/16/21 1339 04/17/21 1443   04/16/21 0200  vancomycin (VANCOCIN) IVPB 1000 mg/200 mL premix        1,000 mg 200 mL/hr over 60 Minutes Intravenous  Once 04/15/21 2103 04/16/21 0625   04/15/21 1220  vancomycin variable dose per unstable renal function (pharmacist dosing)  Status:  Discontinued         Does not apply See admin instructions 04/15/21 1220 04/17/21 0724   04/14/21 1130  vancomycin (VANCOCIN) 2,500 mg in sodium chloride 0.9 % 500 mL IVPB        2,500 mg 250 mL/hr over 120 Minutes Intravenous  Once 04/14/21 1120 04/14/21 2006   04/14/21 1115  cefTRIAXone (ROCEPHIN) 2 g in sodium chloride 0.9 % 100 mL IVPB        2 g 200 mL/hr over 30 Minutes Intravenous Every 24 hours 04/14/21 1109 04/17/21 2359   04/14/21 1115  metroNIDAZOLE  (FLAGYL) IVPB 500 mg        500 mg 100 mL/hr over 60 Minutes Intravenous Every 8 hours 04/14/21 1109 04/17/21 2359   04/14/21 0945  piperacillin-tazobactam (ZOSYN) IVPB 2.25 g  Status:  Discontinued        2.25 g 100 mL/hr over 30 Minutes Intravenous Every 8 hours 04/14/21 0940 04/14/21 1109      Culture/Microbiology    Component Value Date/Time   SDES BLOOD RIGHT HAND 04/14/2021 1035   SPECREQUEST  04/14/2021 1035    [place] Blood Culture adequate volume   CULT  04/14/2021 1035    NO GROWTH 4 DAYS Performed at [facility], [place] [name], [place], [place] [address]    REPTSTATUS PENDING 04/14/2021 1035    Other culture-see note  Unresulted Labs (From admission, onward)     Start     Ordered   04/19/21 0500  Renal function panel  Tomorrow morning,   R        04/18/21 1050   04/19/21 0500  CBC  Tomorrow morning,   R        04/18/21 1050   04/18/21 1051  Renal function panel  Once,   R        04/18/21 1050   Pending  Basic metabolic panel  Daily,   R      Pending   Pending  CBC  Daily,   R      Pending   Pending  CBC  (heparin)  Once,   R       Comments: Baseline for heparin therapy IF NOT ALREADY DRAWN.  Notify MD if PLT < 100 K.    Pending   Pending  Creatinine, serum  (heparin)  Once,   R       Comments: Baseline for heparin therapy IF NOT ALREADY DRAWN.    Pending            Data Reviewed: I have personally reviewed following labs and imaging studies  CBC: Recent Labs  Lab 04/14/21 0804 04/15/21 0508 04/16/21 0435 04/17/21 0334 04/18/21 0345  WBC 6.2 5.6 5.1 11.2* 9.1  NEUTROABS 3.3  --   --  8.5*  --   HGB 8.9* 9.0* 8.9* 6.7* 7.8*  HCT 28.9* 28.9* 28.3* 21.2* 24.1*  MCV 108.6* 109.1* 105.2* 108.2* 101.3*  PLT 203 195 164 155 425    Basic Metabolic Panel: Recent Labs  Lab 04/14/21 0804 04/15/21 0508 04/16/21 0435 04/17/21 0334  NA 136 135 133* 131*  K 4.2 5.1 3.6 6.0*  CL 96* 97* 95* 96*  CO2 28 28 28  24   GLUCOSE 136* 157* 184* 159*  BUN 22 29* 15 27*  CREATININE 8.65* 10.64* 5.82* 8.43*  CALCIUM 9.4 8.9 8.7* 8.4*  PHOS  --   --   --  3.6    GFR: Estimated Creatinine Clearance: 12.3 mL/min (A) (by C-G formula based on SCr of 8.43 mg/dL (H)). Liver Function Tests: Recent Labs  Lab 04/17/21 0334  ALBUMIN 2.2*     CBG: Recent Labs  Lab 04/16/21 2011 04/17/21 0625 04/17/21 1608 04/17/21 1934 04/18/21 0745  GLUCAP 163* 165* 146* 136* 133*     Recent Results (from the past 240 hour(s))  Resp Panel by RT-PCR (Flu A&B, Covid) Nasopharyngeal Swab     Status: None   Collection Time: 04/14/21 10:25 AM   Specimen: Nasopharyngeal Swab; Nasopharyngeal(NP) swabs in vial transport medium  Result Value Ref Range Status   SARS Coronavirus 2 by RT PCR NEGATIVE NEGATIVE Final    Comment: (NOTE) SARS-CoV-2 target nucleic acids are NOT DETECTED.  The SARS-CoV-2 RNA is generally detectable in upper respiratory specimens during the acute phase of infection. The lowest concentration of SARS-CoV-2 viral copies this assay can detect is 138 copies/mL. A negative result does not preclude SARS-Cov-2 infection and should not be used as the sole basis for treatment or other patient management decisions. A negative result may occur with  improper  specimen collection/handling, submission of specimen other than nasopharyngeal swab, presence of viral mutation(s) within the areas targeted by this assay, and inadequate number of viral copies(<138 copies/mL). A negative result must be combined with clinical observations, patient history, and epidemiological information. The expected result is Negative.  Fact Sheet for Patients:  EntrepreneurPulse.com.au  Fact Sheet for Healthcare Providers:  IncredibleEmployment.be  This test is no t yet approved or cleared by the [place] FDA and  has been authorized for detection and/or diagnosis of SARS-CoV-2 by FDA  under an Emergency Use Authorization (EUA). This EUA will remain  in effect (meaning this test can be used) for the duration of the COVID-19 declaration under Section 564(b)(1) of the Act, 21 U.S.C.section 360bbb-3(b)(1), unless the authorization is terminated  or revoked sooner.       Influenza A by PCR NEGATIVE NEGATIVE Final   Influenza B by PCR NEGATIVE NEGATIVE Final    Comment: (NOTE) The Xpert Xpress SARS-CoV-2/FLU/RSV plus assay is intended as an aid in the diagnosis of influenza from Nasopharyngeal swab specimens and should not be used as a sole basis for treatment. Nasal washings and aspirates are unacceptable for Xpert Xpress SARS-CoV-2/FLU/RSV testing.  Fact Sheet for Patients: EntrepreneurPulse.com.au  Fact Sheet for Healthcare Providers: IncredibleEmployment.be  This test is not yet approved or cleared by the [place] FDA and has been authorized for detection and/or diagnosis of SARS-CoV-2 by FDA under an Emergency Use Authorization (EUA). This EUA will remain in effect (meaning this test can be used) for the duration of the COVID-19 declaration under Section 564(b)(1) of the Act, 21 U.S.C. section 360bbb-3(b)(1), unless the authorization is terminated or revoked.  Performed at [facility], [place] [address] [place], [place] [address]   Blood culture (routine x 2)     Status: None (Preliminary result)   Collection Time: 04/14/21 10:30 AM   Specimen: BLOOD  Result Value Ref Range Status   Specimen Description BLOOD RIGHT ANTECUBITAL  Final   Special Requests   Final    BOTTLES DRAWN AEROBIC AND ANAEROBIC Blood Culture adequate volume   Culture   Final    NO GROWTH 4 DAYS Performed at [facility], [place] [address], [place], [place] [address]    Report Status PENDING  Incomplete  Blood culture (routine x 2)     Status: None (Preliminary result)   Collection Time: 04/14/21 10:35 AM   Specimen: BLOOD RIGHT HAND   Result Value Ref Range Status   Specimen Description BLOOD RIGHT HAND  Final   Special Requests   Final    BOTTLES DRAWN AEROBIC AND ANAEROBIC Blood Culture adequate volume   Culture   Final    NO GROWTH 4 DAYS Performed at [facility], [place] [address], [place], [place] [address]    Report Status PENDING  Incomplete  Surgical pcr screen     Status: Abnormal   Collection Time: 04/15/21  4:48 PM   Specimen: Nasal Mucosa; Nasal Swab  Result Value Ref Range Status   MRSA, PCR NEGATIVE NEGATIVE Final   Staphylococcus aureus POSITIVE (A) NEGATIVE Final    Comment: (NOTE) The Xpert SA Assay (FDA approved for NASAL specimens in patients 48 years of age and older), is one component of a comprehensive surveillance program. It is not intended to diagnose infection nor to guide or monitor treatment. Performed at [facility], [place] [address] [place], [place] [address]       Radiology Studies: No results  found.   LOS: 4 days   Bonnielee Haff, MD [facility]  04/18/2021, 10:53 AM

## 2021-04-18 NOTE — Progress Notes (Signed)
Patient ID: Daniel [name], male   DOB: 10/24/1960, 60 y.o.   MRN: [id number] S: Feeling a little better today. O:BP 136/63 (BP Location: Right Leg)    Pulse 85    Temp 98.3 F (36.8 C) (Oral)    Resp 16    Wt 123.5 kg    SpO2 100%    BMI 37.97 kg/m   Intake/Output Summary (Last 24 hours) at 04/18/2021 1046 Last data filed at 04/17/2021 1437 Gross per 24 hour  Intake 150 ml  Output 1000 ml  Net -850 ml   Intake/Output: I/O last 3 completed shifts: In: 150 [Other:150] Out: 1000 [Other:1000]  Intake/Output this shift:  No intake/output data recorded. Weight change:  Gen: NAD CVS: RRR Resp:CTA Abd: +BS, soft, NT/ND Ext: no edema, s/p Left BKA, LUE AVF +T/B with aneurysmal changes and thinning of skin  Recent Labs  Lab 04/14/21 0804 04/15/21 0508 04/16/21 0435 04/17/21 0334  NA 136 135 133* 131*  K 4.2 5.1 3.6 6.0*  CL 96* 97* 95* 96*  CO2 28 28 28 24   GLUCOSE 136* 157* 184* 159*  BUN 22 29* 15 27*  CREATININE 8.65* 10.64* 5.82* 8.43*  ALBUMIN  --   --   --  2.2*  CALCIUM 9.4 8.9 8.7* 8.4*  PHOS  --   --   --  3.6   Liver Function Tests: Recent Labs  Lab 04/17/21 0334  ALBUMIN 2.2*   No results for input(s): LIPASE, AMYLASE in the last 168 hours. No results for input(s): AMMONIA in the last 168 hours. CBC: Recent Labs  Lab 04/14/21 0804 04/15/21 0508 04/16/21 0435 04/17/21 0334 04/18/21 0345  WBC 6.2 5.6 5.1 11.2* 9.1  NEUTROABS 3.3  --   --  8.5*  --   HGB 8.9* 9.0* 8.9* 6.7* 7.8*  HCT 28.9* 28.9* 28.3* 21.2* 24.1*  MCV 108.6* 109.1* 105.2* 108.2* 101.3*  PLT 203 195 164 155 156   Cardiac Enzymes: No results for input(s): CKTOTAL, CKMB, CKMBINDEX, TROPONINI in the last 168 hours. CBG: Recent Labs  Lab 04/16/21 2011 04/17/21 0625 04/17/21 1608 04/17/21 1934 04/18/21 0745  GLUCAP 163* 165* 146* 136* 133*    Iron Studies: No results for input(s): IRON, TIBC, TRANSFERRIN, FERRITIN in the last 72 hours. Studies/Results: No results found.   sodium chloride   Intravenous Once   acetaminophen  650 mg Oral Once   amLODipine  10 mg Oral QHS   calcium acetate  1,334 mg Oral TID WC   Chlorhexidine Gluconate Cloth  6 each Topical Q0600   cloNIDine  0.1 mg Oral Once per day on Tue Thu Sat   cloNIDine  0.1 mg Oral 2 times per day on Sun Mon Wed Fri   diphenhydrAMINE  25 mg Intravenous Once   feeding supplement (NEPRO CARB STEADY)  237 mL Oral BID BM   gabapentin  100 mg Oral TID   heparin  5,000 Units Subcutaneous Q8H   insulin aspart  0-6 Units Subcutaneous TID WC   multivitamin  1 tablet Oral QHS   nicotine  14 mg Transdermal Daily   pantoprazole  40 mg Oral BID   QUEtiapine  50 mg Oral QHS    BMET    Component Value Date/Time   NA 131 (L) 04/17/2021 0334   NA 138 08/22/2020 1141   K 6.0 (H) 04/17/2021 0334   CL 96 (L) 04/17/2021 0334   CO2 24 04/17/2021 0334   GLUCOSE 159 (H) 04/17/2021 0334   BUN 27 (  H) 04/17/2021 0334   BUN 20 08/22/2020 1141   CREATININE 8.43 (H) 04/17/2021 0334   CALCIUM 8.4 (L) 04/17/2021 0334   CALCIUM 7.9 (L) 08/01/2019 0702   GFRNONAA 7 (L) 04/17/2021 0334   GFRAA 7 (L) 12/28/2019 1640   CBC    Component Value Date/Time   WBC 9.1 04/18/2021 0345   RBC 2.38 (L) 04/18/2021 0345   HGB 7.8 (L) 04/18/2021 0345   HGB 8.4 (L) 08/22/2020 1141   HCT 24.1 (L) 04/18/2021 0345   HCT 25.0 (L) 08/22/2020 1141   PLT 156 04/18/2021 0345   PLT 184 08/22/2020 1141   MCV 101.3 (H) 04/18/2021 0345   MCV 94 08/22/2020 1141   MCH 32.8 04/18/2021 0345   MCHC 32.4 04/18/2021 0345   RDW 22.5 (H) 04/18/2021 0345   RDW 16.7 (H) 08/22/2020 1141   LYMPHSABS 1.6 04/17/2021 0334   LYMPHSABS 1.8 08/22/2020 1141   MONOABS 1.0 04/17/2021 0334   EOSABS 0.0 04/17/2021 0334   EOSABS 0.3 08/22/2020 1141   BASOSABS 0.0 04/17/2021 0334   BASOSABS 0.0 08/22/2020 1141   Dialysis Orders: TTS - [facility] 4hrs, BFR 450, DFR 500,  [id number], 2K/ 2Ca Mircera 225 mcg q2wks - last 04/06/21 No Systemic  Heparin Hectorol 35mcg IV with HD-04/13/21 Venofer 100mg  IV X 6 doses-last 04/13/21   Assessment/Plan: Diabetic L foot infection/PVD-Underwent LE angiography 04/08/21 showed limb ischemia L leg. 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 Scot Dock. Continue ABX. ESRD - usual schedule is TTS but is off schedule due to HD day before his surgery.  Had HD on Monday and Wednesday.  Will plan for next HD on Saturday to get back on outpatient schedule.  Vascular access - pt with aneurysmal change of LUE AVF with bleeding.  For plication of aneurysm tomorrow by Dr. Scot Dock, as well as [facility] placement until AVF can be used again.  Hypertension/volume  - Patient euvolemic on exam. Bps stable. Continue current anti-hypertensive regimen Anemia of CKD - Fe held for now d/t current ABX regimen.  Hgb down to 6.7 so will transfuse 2 units with HD today. Secondary Hyperparathyroidism - Ca ok, will check PO4 in AM.  Nutrition - Currently NPO; can advance to renal diet with fluid restrictions as tolerated once clinically stable.  Donetta Potts, MD [employer] [phone]

## 2021-04-18 NOTE — Progress Notes (Signed)
Initial Nutrition Assessment  DOCUMENTATION CODES:   Obesity unspecified  INTERVENTION:  -Nepro Shake po BID, each supplement provides 425 kcal and 19 grams protein -renal mvi daily  NUTRITION DIAGNOSIS:   Increased nutrient needs related to chronic illness (ESRD on HD) as evidenced by estimated needs.  GOAL:   Patient will meet greater than or equal to 90% of their needs  MONITOR:   PO intake, Supplement acceptance, Labs, Weight trends, Skin, I & O's  REASON FOR ASSESSMENT:   Consult Wound healing  ASSESSMENT:   60 y.o. male with PMH of ESRD on TTS HD; DM; HTN; ICH in 2019; and  B/L LE PAD presenting with foot infection.  He was last admitted from 12/12-13 for PAD and had aortogram with cannulation of the R common femoral artery, he does not have revascularization options available and so was to f/u with Dr. Scot Dock in 1-2 weeks to discuss TMA vs. BKA now presents with worsening pain and swelling in his foot.  Pt being followed by vascular; underwent L BKA on 12/20.   Pt unavailable at time of RD visit. Will provide pt with ONS and attempt to obtain diet/wt hx and NFPE upon follow-up  Pt will need new EDW at d/c EDW 128 kg Current wt: 123.5 kg Pt underwent HD off schedule yesterday 12/21 with 1L net UF  PO Intake: 50% x 1 recorded meal    UOP: 2x unmeasured occurrences x24 hours I/O: -2953ml since admit  Medications:  sodium chloride   Intravenous Once   acetaminophen  650 mg Oral Once   amLODipine  10 mg Oral QHS   calcium acetate  1,334 mg Oral TID WC   Chlorhexidine Gluconate Cloth  6 each Topical Q0600   cloNIDine  0.1 mg Oral Once per day on Tue Thu Sat   cloNIDine  0.1 mg Oral 2 times per day on Sun Mon Wed Fri   diphenhydrAMINE  25 mg Intravenous Once   feeding supplement (NEPRO CARB STEADY)  237 mL Oral BID BM   gabapentin  100 mg Oral TID   heparin  5,000 Units Subcutaneous Q8H   insulin aspart  0-6 Units Subcutaneous TID WC   multivitamin  1 tablet  Oral QHS   nicotine  14 mg Transdermal Daily   pantoprazole  40 mg Oral BID   QUEtiapine  50 mg Oral QHS   Labs: Recent Labs  Lab 04/16/21 0435 04/17/21 0334 04/18/21 1206  NA 133* 131* 135  K 3.6 6.0* 3.9  CL 95* 96* 97*  CO2 28 24 28   BUN 15 27* 23  CREATININE 5.82* 8.43* 7.03*  CALCIUM 8.7* 8.4* 8.5*  PHOS  --  3.6 2.5  GLUCOSE 184* 159* 132*  CBGs: 133-165 x24 hours    NUTRITION - FOCUSED PHYSICAL EXAM: Unable to complete at this time, will attempt at follow-up  Diet Order:   Diet Order             Diet NPO time specified Except for: Sips with Meds  Diet effective midnight           Diet renal with fluid restriction Fluid restriction: 1200 mL Fluid; Room service appropriate? Yes; Fluid consistency: Thin  Diet effective now                   EDUCATION NEEDS:   Education needs have been addressed  Skin:  Skin Assessment: Skin Integrity Issues: Skin Integrity Issues:: Incisions Incisions: L leg  Last BM:  PTA  Height:   Ht Readings from Last 1 Encounters:  04/08/21 5\' 11"  (1.803 m)    Weight:   Wt Readings from Last 1 Encounters:  04/17/21 123.5 kg    BMI: 39.2kg/m^2 adjusted for L BKA   Estimated Nutritional Needs:  Kcal:  8242-3536 Protein:  130-160 grams Fluid:  1L+UOP     Theone Stanley., MS, RD, LDN (she/her/hers) RD pager number and weekend/on-call pager number located in [place].

## 2021-04-18 NOTE — Progress Notes (Addendum)
° °  VASCULAR SURGERY ASSESSMENT & PLAN:   POD 2 S/P LEFT BELOW THE KNEE AMPUTATION: So far the amputation site is healing adequately.  I have written to begin daily dressing changes.  ANEURYSM LEFT AV FISTULA: I think the skin overlying this aneurysm in his left upper arm fistula is compromised.  This reason I think this is at risk for bleeding.  I have recommended plication of this aneurysm tomorrow in the operating room.  I would likely have to place a catheter for dialysis as there would likely not be enough room to cannulate the fistula after excision of the aneurysm.  I have written preop orders.  END-STAGE RENAL DISEASE: I have notified nephrology concerning her plans for surgery in the morning.  They will either dialyze him today or tomorrow afternoon.  DVT PROPHYLAXIS: He is on subcu heparin.    ACUTE BLOOD LOSS ANEMIA: His hemoglobin today is up to 7.8.   SUBJECTIVE:   Pain much better controlled this morning.  PHYSICAL EXAM:   Vitals:   04/17/21 1230 04/17/21 1245 04/17/21 1938 04/18/21 0745  BP: (!) 121/44 (!) 120/51 (!) 124/51 136/63  Pulse: 86 81 94 85  Resp:  19 20 16   Temp:  (!) 97.5 F (36.4 C) 98.3 F (36.8 C)   TempSrc:  Temporal Oral   SpO2:  97% 100% 100%  Weight:  123.5 kg     He has a left below the knee amputation site so far is healing adequately.   He has an aneurysm in his left upper arm fistula with overlying skin that is compromised.        LABS:   Lab Results  Component Value Date   WBC 9.1 04/18/2021   HGB 7.8 (L) 04/18/2021   HCT 24.1 (L) 04/18/2021   MCV 101.3 (H) 04/18/2021   PLT 156 04/18/2021   Lab Results  Component Value Date   CREATININE 8.43 (H) 04/17/2021   Lab Results  Component Value Date   INR 1.0 03/24/2021   CBG (last 3)  Recent Labs    04/17/21 1608 04/17/21 1934 04/18/21 0745  GLUCAP 146* 136* 133*    PROBLEM LIST:    Principal Problem:   Diabetic foot infection ([place]) Active Problems:   Essential  hypertension   End-stage renal disease on hemodialysis ([place])   Diabetes mellitus type 2, controlled, with complications ([place])   Class 2 obesity due to excess calories with body mass index (BMI) of 39.0 to 39.9 in adult   PAD (peripheral artery disease) ([facility])   CURRENT MEDS:    sodium chloride   Intravenous Once   acetaminophen  650 mg Oral Once   amLODipine  10 mg Oral QHS   calcium acetate  1,334 mg Oral TID WC   Chlorhexidine Gluconate Cloth  6 each Topical Q0600   cloNIDine  0.1 mg Oral Once per day on Tue Thu Sat   cloNIDine  0.1 mg Oral 2 times per day on Sun Mon Wed Fri   diphenhydrAMINE  25 mg Intravenous Once   gabapentin  100 mg Oral TID   heparin  5,000 Units Subcutaneous Q8H   insulin aspart  0-6 Units Subcutaneous TID WC   nicotine  14 mg Transdermal Daily   pantoprazole  40 mg Oral BID   QUEtiapine  50 mg Oral QHS    Deitra Mayo Office: [phone] 04/18/2021

## 2021-04-18 NOTE — Evaluation (Signed)
Physical Therapy Evaluation Patient Details Name: Daniel [name] MRN: [id number] DOB: [birth year] Today's Date: 04/18/2021  History of Present Illness  Daniel [name], 60 y.o. male admitted 12/18 presenting with foot infection.  He was last admitted from 12/12-13 for PAD and had aortogram with cannulation of the R common femoral artery.  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   PMH of ESRD on TTS HD; DM; HTN; ICH in 2019; and  B/L LE PAD  Clinical Impression  Pt admitted with above diagnosis. Pt was able to stand to RW with min guard assist but fatigues and needs min assist as endurance is impaired.  Feel that pt would  make good progress on AIR and be able to reach his goals of Modif I.  Pt is actively looking for a new place to live as he most likely wont be able to return to his boarding house with 3 steps therefore this could be a barrier to his d/c home.  Will need SW to possibly assist pt.   Pt currently with functional limitations due to the deficits listed below (see PT Problem List). Pt will benefit from skilled PT to increase their independence and safety with mobility to allow discharge to the venue listed below.          Recommendations for follow up therapy are one component of a multi-disciplinary discharge planning process, led by the attending physician.  Recommendations may be updated based on patient status, additional functional criteria and insurance authorization.  Follow Up Recommendations Acute inpatient rehab (3hours/day)    Assistance Recommended at Discharge Frequent or constant Supervision/Assistance  Functional Status Assessment Patient has had a recent decline in their functional status and demonstrates the ability to make significant improvements in function in a reasonable and predictable amount of time.  Equipment Recommendations  Wheelchair (measurements PT);Wheelchair cushion (measurements PT);Rolling walker (2 wheels);BSC/3in1    Recommendations for Other Services  Rehab consult     Precautions / Restrictions Precautions Precautions: Fall Restrictions LLE Weight Bearing: Non weight bearing      Mobility  Bed Mobility Overal bed mobility: Modified Independent             General bed mobility comments: Pt able to perform all bed mobility aspects without assistance.    Transfers Overall transfer level: Needs assistance Equipment used: Rolling walker (2 wheels) Transfers: Sit to/from Stand;Bed to chair/wheelchair/BSC Sit to Stand: Min assist;From elevated surface;+2 safety/equipment   Step pivot transfers: Min assist;+2 safety/equipment       General transfer comment: Pt came to stand from edge of bed and noted BM on pad. Cleaned pt with pt fatigueing after standing for up to a minute with one LOB which PT and OT steadied pt.  Pt was able to step pivot on around to chair with min assist and steadying assist as pt slightly off balance and needed assist to move the RW. Pt needed assist to control descend into chair as well.    Ambulation/Gait                  Stairs            Wheelchair Mobility    Modified Rankin (Stroke Patients Only)       Balance Overall balance assessment: Modified Independent Sitting-balance support: No upper extremity supported;Feet supported Sitting balance-Leahy Scale: Good     Standing balance support: Bilateral upper extremity supported;During functional activity Standing balance-Leahy Scale: Poor Standing balance comment: relies on UE suport on RW and external support  with dynamic activity.                             Pertinent Vitals/Pain Pain Assessment: Faces Faces Pain Scale: Hurts even more Pain Location: left incision residual limb Pain Descriptors / Indicators: Sore Pain Intervention(s): Limited activity within patient's tolerance;Monitored during session;Repositioned    Home Living Family/patient expects to be discharged to::  (boarding house) Living  Arrangements: Other (Comment)   Type of Home: House Home Access: Stairs to enter Entrance Stairs-Rails: Left Entrance Stairs-Number of Steps: 3 Alternate Level Stairs-Number of Steps: flight Home Layout: Two level;Other (Comment) (shower upstairs) Home Equipment: None Additional Comments: information above different from information charted earlier this month, may need to verify    Prior Function Prior Level of Function : Independent/Modified Independent             Mobility Comments: Reports being independent without AD ADLs Comments: cleans own room and cooks for self.     Hand Dominance   Dominant Hand: Right    Extremity/Trunk Assessment   Upper Extremity Assessment Upper Extremity Assessment: Defer to OT evaluation    Lower Extremity Assessment RLE Deficits / Details: Gross functional weakness noted; edema noted with skin peeling at ankles and feet LLE Deficits / Details: new left BKA       Communication   Communication: No difficulties  Cognition Arousal/Alertness: Awake/alert Behavior During Therapy: WFL for tasks assessed/performed Overall Cognitive Status: Within Functional Limits for tasks assessed                                          General Comments      Exercises General Exercises - Lower Extremity Ankle Circles/Pumps: AROM;Right;5 reps;Supine Quad Sets: AROM;Both;10 reps;Seated Long Arc Quad: AROM;Both;10 reps;Seated Hip Flexion/Marching: AROM;Both;10 reps;Seated   Assessment/Plan    PT Assessment Patient needs continued PT services  PT Problem List Decreased strength;Decreased activity tolerance;Decreased balance;Decreased mobility;Decreased skin integrity;Pain;Decreased knowledge of use of DME;Decreased safety awareness;Decreased knowledge of precautions       PT Treatment Interventions DME instruction;Gait training;Stair training;Functional mobility training;Therapeutic activities;Therapeutic exercise;Balance  training;Neuromuscular re-education;Patient/family education;Wheelchair mobility training    PT Goals (Current goals can be found in the Care Plan section)  Acute Rehab PT Goals Patient Stated Goal: to get better PT Goal Formulation: With patient Time For Goal Achievement: 05/02/21 Potential to Achieve Goals: Good    Frequency Min 5X/week   Barriers to discharge Decreased caregiver support      Co-evaluation               AM-PAC PT "6 Clicks" Mobility  Outcome Measure Help needed turning from your back to your side while in a flat bed without using bedrails?: None Help needed moving from lying on your back to sitting on the side of a flat bed without using bedrails?: None Help needed moving to and from a bed to a chair (including a wheelchair)?: A Lot Help needed standing up from a chair using your arms (e.g., wheelchair or bedside chair)?: A Lot Help needed to walk in hospital room?: Total Help needed climbing 3-5 steps with a railing? : Total 6 Click Score: 14    End of Session Equipment Utilized During Treatment: Gait belt Activity Tolerance: Patient tolerated treatment well Patient left: with call bell/phone within reach;in chair;with chair alarm set Nurse Communication: Mobility status  PT Visit Diagnosis: Unsteadiness on feet (R26.81);Other abnormalities of gait and mobility (R26.89);Muscle weakness (generalized) (M62.81) Pain - Right/Left:  (bil) Pain - part of body: Leg    Time: 7076-1518 PT Time Calculation (min) (ACUTE ONLY): 16 min   Charges:   PT Evaluation $PT Eval Moderate Complexity: 1 Mod          Sabir Charters M,PT Acute Rehab Services [phone] (pager)   Alvira Philips 04/18/2021, 2:04 PM

## 2021-04-18 NOTE — Evaluation (Signed)
Occupational Therapy Evaluation Patient Details Name: Daniel [name] MRN: [id number] DOB: [birth year] Today's Date: 04/18/2021   History of Present Illness Daniel [name], 60 y.o. male admitted 12/18 presenting with foot infection.  He was last admitted from 12/12-13 for PAD and had aortogram with cannulation of the R common femoral artery.  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   PMH of ESRD on TTS HD; DM; HTN; ICH in 2019; and  B/L LE PAD   Clinical Impression   Pt presents with decline in function and safety with ADLs and ADL mobility with impaired balance and endurance. Pt reports that PTA, he lived I a boarding house and was Ind with ADLs/selfcare, IADLs, mobility with no AD and was driving. Pt currently requires mod A with LB ADLs and toileting; min A +2 for safety using RW for mobility. Pt would benefit from acute OT services to address impairments to maximize level of function and safety      Recommendations for follow up therapy are one component of a multi-disciplinary discharge planning process, led by the attending physician.  Recommendations may be updated based on patient status, additional functional criteria and insurance authorization.   Follow Up Recommendations  Acute inpatient rehab (3hours/day)    Assistance Recommended at Discharge Frequent or constant Supervision/Assistance  Functional Status Assessment  Patient has had a recent decline in their functional status and demonstrates the ability to make significant improvements in function in a reasonable and predictable amount of time.  Equipment Recommendations  Other (comment);Tub/shower seat;BSC/3in1;Wheelchair (measurements OT);Wheelchair cushion (measurements OT) (TBD at next venue of care)    Recommendations for Other Services Rehab consult     Precautions / Restrictions Precautions Precautions: Fall Restrictions Weight Bearing Restrictions: Yes RLE Weight Bearing: Weight bearing as tolerated LLE Weight Bearing:  Non weight bearing      Mobility Bed Mobility Overal bed mobility: Modified Independent             General bed mobility comments: Pt able to perform all bed mobility aspects without assistance.    Transfers Overall transfer level: Needs assistance Equipment used: Rolling walker (2 wheels) Transfers: Sit to/from Stand;Bed to chair/wheelchair/BSC Sit to Stand: Min assist;From elevated surface;+2 safety/equipment     Step pivot transfers: Min assist;+2 safety/equipment     General transfer comment: Pt came to stand from edge of bed and noted BM on pad. Cleaned pt with pt fatigueing after standing for up to a minute with one LOB which PT and OT steadied pt.  Pt was able to step pivot on around to chair with min assist and steadying assist as pt slightly off balance and needed assist to move the RW. Pt needed assist to control descend into chair as well.      Balance Overall balance assessment: Modified Independent Sitting-balance support: No upper extremity supported;Feet supported Sitting balance-Leahy Scale: Good     Standing balance support: Bilateral upper extremity supported;During functional activity Standing balance-Leahy Scale: Poor Standing balance comment: relies on UE suport on RW and external support with dynamic activity.                           ADL either performed or assessed with clinical judgement   ADL Overall ADL's : Needs assistance/impaired Eating/Feeding: Independent;Sitting   Grooming: Wash/dry hands;Wash/dry face;Set up;Supervision/safety;Sitting   Upper Body Bathing: Set up;Supervision/ safety;Sitting   Lower Body Bathing: Moderate assistance;Sitting/lateral leans   Upper Body Dressing : Set up;Supervision/safety;Sitting   Lower Body  Dressing: Moderate assistance;Sitting/lateral leans   Toilet Transfer: Minimal assistance;+2 for safety/equipment;Ambulation;Cueing for safety;Rolling walker (2 wheels)   Toileting- Clothing  Manipulation and Hygiene: Moderate assistance;Sit to/from stand       Functional mobility during ADLs: Minimal assistance;+2 for safety/equipment;Cueing for safety;Rollator (4 wheels)       Vision Baseline Vision/History: 1 Wears glasses Ability to See in Adequate Light: 0 Adequate Patient Visual Report: No change from baseline       Perception     Praxis      Pertinent Vitals/Pain Pain Assessment: Faces Faces Pain Scale: Hurts even more Breathing: normal Negative Vocalization: none Facial Expression: smiling or inexpressive Body Language: relaxed Pain Location: left incision residual limb Pain Descriptors / Indicators: Sore Pain Intervention(s): Monitored during session;Limited activity within patient's tolerance;Repositioned     Hand Dominance Right   Extremity/Trunk Assessment Upper Extremity Assessment Upper Extremity Assessment: Overall WFL for tasks assessed   Lower Extremity Assessment Lower Extremity Assessment: Defer to PT evaluation RLE Deficits / Details: Gross functional weakness noted; edema noted with skin peeling at ankles and feet LLE Deficits / Details: new left BKA   Cervical / Trunk Assessment Cervical / Trunk Assessment: Normal   Communication Communication Communication: No difficulties   Cognition Arousal/Alertness: Awake/alert Behavior During Therapy: WFL for tasks assessed/performed Overall Cognitive Status: Within Functional Limits for tasks assessed                                       General Comments       Exercises Exercises: General Lower Extremity General Exercises - Lower Extremity Ankle Circles/Pumps: AROM;Right;5 reps;Supine Quad Sets: AROM;Both;10 reps;Seated Long Arc Quad: AROM;Both;10 reps;Seated Hip Flexion/Marching: AROM;Both;10 reps;Seated   Shoulder Instructions      Home Living Family/patient expects to be discharged to:: Other (Comment) (boarding house) Living Arrangements: Other (Comment)  (boarding house)   Type of Home: House Home Access: Stairs to enter [employer] of Steps: 3 Entrance Stairs-Rails: Left Home Layout: Two level;Other (Comment) (shower is upstairs) Alternate Level Stairs-Number of Steps: flight Alternate Level Stairs-Rails: Right Bathroom Shower/Tub: Teacher, early years/pre: Standard     Home Equipment: None   Additional Comments: information above different from information charted earlier this month, may need to verify      Prior Functioning/Environment Prior Level of Function : Independent/Modified Independent             Mobility Comments: Reports being independent without AD ADLs Comments: Ind with ADLs/selfcare, cleans own room and cooks for self.        OT Problem List: Impaired balance (sitting and/or standing);Pain;Decreased activity tolerance;Decreased coordination;Decreased knowledge of use of DME or AE      OT Treatment/Interventions: Self-care/ADL training;Patient/family education;Balance training;Therapeutic activities;DME and/or AE instruction    OT Goals(Current goals can be found in the care plan section) Acute Rehab OT Goals Patient Stated Goal: go to rehab and go home OT Goal Formulation: With patient Time For Goal Achievement: 05/02/21 Potential to Achieve Goals: Good ADL Goals Pt Will Perform Grooming: with min assist;with min guard assist;standing Pt Will Perform Lower Body Bathing: with min assist;sitting/lateral leans Pt Will Perform Lower Body Dressing: with min assist;sitting/lateral leans Pt Will Transfer to Toilet: with min assist;with min guard assist;stand pivot transfer;ambulating;bedside commode Pt Will Perform Toileting - Clothing Manipulation and hygiene: with min assist;with min guard assist;sitting/lateral leans;sit to/from stand  OT Frequency: Min 2X/week  Barriers to D/C:            Co-evaluation              AM-PAC OT "6 Clicks" Daily Activity     Outcome  Measure Help from another person eating meals?: None Help from another person taking care of personal grooming?: A Little Help from another person toileting, which includes using toliet, bedpan, or urinal?: A Lot Help from another person bathing (including washing, rinsing, drying)?: A Lot Help from another person to put on and taking off regular upper body clothing?: A Little Help from another person to put on and taking off regular lower body clothing?: A Lot 6 Click Score: 16   End of Session Equipment Utilized During Treatment: Gait belt;Rolling walker (2 wheels)  Activity Tolerance: Patient tolerated treatment well Patient left: in chair;with call bell/phone within reach;with chair alarm set  OT Visit Diagnosis: Unsteadiness on feet (R26.81);Other abnormalities of gait and mobility (R26.89);Pain Pain - Right/Left: Left Pain - part of body: Leg                Time: 9021-1155 OT Time Calculation (min): 16 min Charges:  OT Evaluation $OT Eval Moderate Complexity: 1 Mod   Britt Bottom 04/18/2021, 2:41 PM

## 2021-04-18 NOTE — Progress Notes (Signed)
Inpatient Rehab Admissions Coordinator Note:   Per therapy recommendations patient was screened for CIR candidacy by Michel Santee, PT. At this time, pt appears to be a potential candidate for CIR. I will place an order for rehab consult for full assessment, per our protocol.  Please contact me any with questions.Shann Medal, PT, DPT [phone] 04/18/21 4:23 PM

## 2021-04-18 NOTE — H&P (View-Only) (Signed)
° °  VASCULAR SURGERY ASSESSMENT & PLAN:   POD 2 S/P LEFT BELOW THE KNEE AMPUTATION: So far the amputation site is healing adequately.  I have written to begin daily dressing changes.  ANEURYSM LEFT AV FISTULA: I think the skin overlying this aneurysm in his left upper arm fistula is compromised.  This reason I think this is at risk for bleeding.  I have recommended plication of this aneurysm tomorrow in the operating room.  I would likely have to place a catheter for dialysis as there would likely not be enough room to cannulate the fistula after excision of the aneurysm.  I have written preop orders.  END-STAGE RENAL DISEASE: I have notified nephrology concerning her plans for surgery in the morning.  They will either dialyze him today or tomorrow afternoon.  DVT PROPHYLAXIS: He is on subcu heparin.    ACUTE BLOOD LOSS ANEMIA: His hemoglobin today is up to 7.8.   SUBJECTIVE:   Pain much better controlled this morning.  PHYSICAL EXAM:   Vitals:   04/17/21 1230 04/17/21 1245 04/17/21 1938 04/18/21 0745  BP: (!) 121/44 (!) 120/51 (!) 124/51 136/63  Pulse: 86 81 94 85  Resp:  19 20 16   Temp:  (!) 97.5 F (36.4 C) 98.3 F (36.8 C)   TempSrc:  Temporal Oral   SpO2:  97% 100% 100%  Weight:  123.5 kg     He has a left below the knee amputation site so far is healing adequately.   He has an aneurysm in his left upper arm fistula with overlying skin that is compromised.        LABS:   Lab Results  Component Value Date   WBC 9.1 04/18/2021   HGB 7.8 (L) 04/18/2021   HCT 24.1 (L) 04/18/2021   MCV 101.3 (H) 04/18/2021   PLT 156 04/18/2021   Lab Results  Component Value Date   CREATININE 8.43 (H) 04/17/2021   Lab Results  Component Value Date   INR 1.0 03/24/2021   CBG (last 3)  Recent Labs    04/17/21 1608 04/17/21 1934 04/18/21 0745  GLUCAP 146* 136* 133*    PROBLEM LIST:    Principal Problem:   Diabetic foot infection ([place]) Active Problems:   Essential  hypertension   End-stage renal disease on hemodialysis ([place])   Diabetes mellitus type 2, controlled, with complications ([place])   Class 2 obesity due to excess calories with body mass index (BMI) of 39.0 to 39.9 in adult   PAD (peripheral artery disease) ([facility])   CURRENT MEDS:    sodium chloride   Intravenous Once   acetaminophen  650 mg Oral Once   amLODipine  10 mg Oral QHS   calcium acetate  1,334 mg Oral TID WC   Chlorhexidine Gluconate Cloth  6 each Topical Q0600   cloNIDine  0.1 mg Oral Once per day on Tue Thu Sat   cloNIDine  0.1 mg Oral 2 times per day on Sun Mon Wed Fri   diphenhydrAMINE  25 mg Intravenous Once   gabapentin  100 mg Oral TID   heparin  5,000 Units Subcutaneous Q8H   insulin aspart  0-6 Units Subcutaneous TID WC   nicotine  14 mg Transdermal Daily   pantoprazole  40 mg Oral BID   QUEtiapine  50 mg Oral QHS    Deitra Mayo Office: [phone] 04/18/2021

## 2021-04-18 NOTE — Plan of Care (Signed)

## 2021-04-18 NOTE — Plan of Care (Signed)

## 2021-04-19 ENCOUNTER — Encounter (HOSPITAL_COMMUNITY)
Admission: EM | Disposition: A | Discharge status: Skilled Nursing Facility | Payer: Self-pay | Source: Home / Self Care | Attending: Internal Medicine

## 2021-04-19 ENCOUNTER — Inpatient Hospital Stay (HOSPITAL_COMMUNITY): Payer: Medicare Other | Admitting: Certified Registered"

## 2021-04-19 ENCOUNTER — Encounter (HOSPITAL_COMMUNITY): Payer: Self-pay | Admitting: Internal Medicine

## 2021-04-19 ENCOUNTER — Inpatient Hospital Stay (HOSPITAL_COMMUNITY): Payer: Medicare Other

## 2021-04-19 DIAGNOSIS — L089 Local infection of the skin and subcutaneous tissue, unspecified: Secondary | ICD-10-CM | POA: Diagnosis not present

## 2021-04-19 DIAGNOSIS — N186 End stage renal disease: Secondary | ICD-10-CM

## 2021-04-19 DIAGNOSIS — E118 Type 2 diabetes mellitus with unspecified complications: Secondary | ICD-10-CM | POA: Diagnosis not present

## 2021-04-19 DIAGNOSIS — I10 Essential (primary) hypertension: Secondary | ICD-10-CM | POA: Diagnosis not present

## 2021-04-19 DIAGNOSIS — E11628 Type 2 diabetes mellitus with other skin complications: Secondary | ICD-10-CM | POA: Diagnosis not present

## 2021-04-19 DIAGNOSIS — T82898A Other specified complication of vascular prosthetic devices, implants and grafts, initial encounter: Secondary | ICD-10-CM

## 2021-04-19 HISTORY — PX: INSERTION OF DIALYSIS CATHETER: SHX1324

## 2021-04-19 HISTORY — PX: FISTULA SUPERFICIALIZATION: SHX6341

## 2021-04-19 LAB — RENAL FUNCTION PANEL
Albumin: 2.1 g/dL — ABNORMAL LOW (ref 3.5–5.0)
Anion gap: 10 (ref 5–15)
BUN: 32 mg/dL — ABNORMAL HIGH (ref 8–23)
CO2: 27 mmol/L (ref 22–32)
Calcium: 8.3 mg/dL — ABNORMAL LOW (ref 8.9–10.3)
Chloride: 99 mmol/L (ref 98–111)
Creatinine, Ser: 8.31 mg/dL — ABNORMAL HIGH (ref 0.61–1.24)
GFR, Estimated: 7 mL/min — ABNORMAL LOW (ref >60)
Glucose, Bld: 158 mg/dL — ABNORMAL HIGH (ref 70–99)
Phosphorus: 3.1 mg/dL (ref 2.5–4.6)
Potassium: 4.1 mmol/L (ref 3.5–5.1)
Sodium: 136 mmol/L (ref 135–145)

## 2021-04-19 LAB — CULTURE, BLOOD (ROUTINE X 2)
Culture: NO GROWTH
Culture: NO GROWTH
Special Requests: ADEQUATE
Special Requests: ADEQUATE

## 2021-04-19 LAB — GLUCOSE, CAPILLARY
Glucose-Capillary: 103 mg/dL — ABNORMAL HIGH (ref 70–99)
Glucose-Capillary: 89 mg/dL (ref 70–99)
Glucose-Capillary: 71 mg/dL (ref 70–99)
Glucose-Capillary: 93 mg/dL (ref 70–99)
Glucose-Capillary: 190 mg/dL — ABNORMAL HIGH (ref 70–99)
Glucose-Capillary: 136 mg/dL — ABNORMAL HIGH (ref 70–99)

## 2021-04-19 LAB — CBC
HCT: 23.1 % — ABNORMAL LOW (ref 39.0–52.0)
Hemoglobin: 7.3 g/dL — ABNORMAL LOW (ref 13.0–17.0)
MCH: 32.6 pg (ref 26.0–34.0)
MCHC: 31.6 g/dL (ref 30.0–36.0)
MCV: 103.1 fL — ABNORMAL HIGH (ref 80.0–100.0)
Platelets: 147 10*3/uL — ABNORMAL LOW (ref 150–400)
RBC: 2.24 MIL/uL — ABNORMAL LOW (ref 4.22–5.81)
RDW: 22.1 % — ABNORMAL HIGH (ref 11.5–15.5)
WBC: 5.9 10*3/uL (ref 4.0–10.5)
nRBC: 3.9 % — ABNORMAL HIGH (ref 0.0–0.2)

## 2021-04-19 LAB — PREPARE RBC (CROSSMATCH)

## 2021-04-19 SURGERY — INSERTION OF DIALYSIS CATHETER
Anesthesia: General

## 2021-04-19 MED ORDER — SACCHAROMYCES BOULARDII 250 MG PO CAPS
250.0000 mg | ORAL_CAPSULE | Freq: Two times a day (BID) | ORAL | Status: DC
  Administered 2021-04-19 – 2021-04-25 (×13): 250 mg via ORAL
  Filled 2021-04-19 (×13): qty 1

## 2021-04-19 MED ORDER — CHLORHEXIDINE GLUCONATE 0.12 % MT SOLN
OROMUCOSAL | Status: AC
  Administered 2021-04-19: 09:00:00 15 mL via OROMUCOSAL
  Filled 2021-04-19: qty 15

## 2021-04-19 MED ORDER — PROPOFOL 10 MG/ML IV BOLUS
INTRAVENOUS | Status: AC
  Filled 2021-04-19: qty 20

## 2021-04-19 MED ORDER — CEFAZOLIN IN SODIUM CHLORIDE 3-0.9 GM/100ML-% IV SOLN
3.0000 g | Freq: Once | INTRAVENOUS | Status: AC
  Administered 2021-04-19: 10:00:00 2 g via INTRAVENOUS

## 2021-04-19 MED ORDER — HEPARIN 6000 UNIT IRRIGATION SOLUTION
Status: DC | PRN
  Administered 2021-04-19: 1

## 2021-04-19 MED ORDER — ONDANSETRON HCL 4 MG/2ML IJ SOLN
INTRAMUSCULAR | Status: AC
  Filled 2021-04-19: qty 2

## 2021-04-19 MED ORDER — CEFAZOLIN SODIUM-DEXTROSE 2-4 GM/100ML-% IV SOLN
INTRAVENOUS | Status: AC
  Filled 2021-04-19: qty 100

## 2021-04-19 MED ORDER — LIDOCAINE 2% (20 MG/ML) 5 ML SYRINGE
INTRAMUSCULAR | Status: DC | PRN
  Administered 2021-04-19: 60 mg via INTRAVENOUS

## 2021-04-19 MED ORDER — PROSOURCE PLUS PO LIQD
30.0000 mL | Freq: Two times a day (BID) | ORAL | Status: DC
  Administered 2021-04-19 – 2021-04-21 (×4): 30 mL via ORAL
  Filled 2021-04-19 (×5): qty 30

## 2021-04-19 MED ORDER — PHENYLEPHRINE HCL-NACL 20-0.9 MG/250ML-% IV SOLN
INTRAVENOUS | Status: DC | PRN
  Administered 2021-04-19: 30 ug/min via INTRAVENOUS

## 2021-04-19 MED ORDER — LIDOCAINE-EPINEPHRINE (PF) 1 %-1:200000 IJ SOLN
INTRAMUSCULAR | Status: DC | PRN
  Administered 2021-04-19: 10 mL via INTRADERMAL

## 2021-04-19 MED ORDER — FENTANYL CITRATE (PF) 100 MCG/2ML IJ SOLN
25.0000 ug | INTRAMUSCULAR | Status: DC | PRN
  Administered 2021-04-19: 12:00:00 50 ug via INTRAVENOUS

## 2021-04-19 MED ORDER — CHLORHEXIDINE GLUCONATE 0.12 % MT SOLN: 15.0000 mL | Freq: Once | OROMUCOSAL | Status: AC

## 2021-04-19 MED ORDER — HEPARIN SODIUM (PORCINE) 1000 UNIT/ML IJ SOLN
INTRAMUSCULAR | Status: DC | PRN
  Administered 2021-04-19: 3800 [IU] via INTRAVENOUS

## 2021-04-19 MED ORDER — OXYCODONE HCL 5 MG PO TABS: 5.0000 mg | ORAL_TABLET | Freq: Once | ORAL | Status: DC | PRN

## 2021-04-19 MED ORDER — ONDANSETRON HCL 4 MG/2ML IJ SOLN
4.0000 mg | Freq: Once | INTRAMUSCULAR | Status: AC | PRN
  Administered 2021-04-19: 12:00:00 4 mg via INTRAVENOUS

## 2021-04-19 MED ORDER — PROPOFOL 10 MG/ML IV BOLUS
INTRAVENOUS | Status: DC | PRN
  Administered 2021-04-19: 150 mg via INTRAVENOUS

## 2021-04-19 MED ORDER — HEPARIN SODIUM (PORCINE) 1000 UNIT/ML IJ SOLN
INTRAMUSCULAR | Status: DC | PRN
  Administered 2021-04-19: 10000 [IU] via INTRAVENOUS

## 2021-04-19 MED ORDER — OXYCODONE HCL 5 MG/5ML PO SOLN: 5.0000 mg | Freq: Once | ORAL | Status: DC | PRN

## 2021-04-19 MED ORDER — FENTANYL CITRATE (PF) 100 MCG/2ML IJ SOLN
INTRAMUSCULAR | Status: AC
  Filled 2021-04-19: qty 2

## 2021-04-19 MED ORDER — SODIUM CHLORIDE 0.9 % IV SOLN: INTRAVENOUS | Status: DC

## 2021-04-19 MED ORDER — LOPERAMIDE HCL 2 MG PO CAPS
4.0000 mg | ORAL_CAPSULE | Freq: Three times a day (TID) | ORAL | Status: DC | PRN
  Administered 2021-04-20 – 2021-04-21 (×2): 4 mg via ORAL
  Filled 2021-04-19 (×2): qty 2

## 2021-04-19 MED ORDER — ORAL CARE MOUTH RINSE: 15.0000 mL | Freq: Once | OROMUCOSAL | Status: AC

## 2021-04-19 MED ORDER — MIDAZOLAM HCL 2 MG/2ML IJ SOLN
INTRAMUSCULAR | Status: AC
  Filled 2021-04-19: qty 2

## 2021-04-19 MED ORDER — ONDANSETRON HCL 4 MG/2ML IJ SOLN
INTRAMUSCULAR | Status: DC | PRN
  Administered 2021-04-19: 4 mg via INTRAVENOUS

## 2021-04-19 MED ORDER — PROTAMINE SULFATE 10 MG/ML IV SOLN
INTRAVENOUS | Status: DC | PRN
  Administered 2021-04-19 (×5): 10 mg via INTRAVENOUS

## 2021-04-19 MED ORDER — PHENYLEPHRINE 40 MCG/ML (10ML) SYRINGE FOR IV PUSH (FOR BLOOD PRESSURE SUPPORT)
PREFILLED_SYRINGE | INTRAVENOUS | Status: DC | PRN
  Administered 2021-04-19: 80 ug via INTRAVENOUS

## 2021-04-19 MED ORDER — 0.9 % SODIUM CHLORIDE (POUR BTL) OPTIME
TOPICAL | Status: DC | PRN
  Administered 2021-04-19: 11:00:00 1000 mL

## 2021-04-19 SURGICAL SUPPLY — 60 items
ADH SKN CLS APL DERMABOND .7 (GAUZE/BANDAGES/DRESSINGS) ×2
APL PRP STRL LF DISP 70% ISPRP (MISCELLANEOUS) ×2
ARMBAND PINK RESTRICT EXTREMIT (MISCELLANEOUS) ×4 IMPLANT
BAG COUNTER SPONGE SURGICOUNT (BAG) ×3 IMPLANT
BAG DECANTER FOR FLEXI CONT (MISCELLANEOUS) ×4 IMPLANT
BAG SPNG CNTER NS LX DISP (BAG) ×2
BAG SURGICOUNT SPONGE COUNTING (BAG) ×1
BIOPATCH RED 1 DISK 7.0 (GAUZE/BANDAGES/DRESSINGS) ×3 IMPLANT
BIOPATCH RED 1IN DISK 7.0MM (GAUZE/BANDAGES/DRESSINGS) ×1
BNDG ELASTIC 4X5.8 VLCR STR LF (GAUZE/BANDAGES/DRESSINGS) ×2 IMPLANT
BNDG GAUZE ELAST 4 BULKY (GAUZE/BANDAGES/DRESSINGS) ×2 IMPLANT
CANISTER SUCT 3000ML PPV (MISCELLANEOUS) ×4 IMPLANT
CANNULA VESSEL 3MM 2 BLNT TIP (CANNULA) ×4 IMPLANT
CATH PALINDROME-P 19CM W/VT (CATHETERS) IMPLANT
CATH PALINDROME-P 23CM W/VT (CATHETERS) ×2 IMPLANT
CATH PALINDROME-P 28CM W/VT (CATHETERS) IMPLANT
CHLORAPREP W/TINT 26 (MISCELLANEOUS) ×4 IMPLANT
CLIP VESOCCLUDE MED 6/CT (CLIP) ×4 IMPLANT
CLIP VESOCCLUDE SM WIDE 6/CT (CLIP) ×4 IMPLANT
COVER PROBE W GEL 5X96 (DRAPES) ×4 IMPLANT
COVER SURGICAL LIGHT HANDLE (MISCELLANEOUS) ×4 IMPLANT
DERMABOND ADVANCED (GAUZE/BANDAGES/DRESSINGS) ×2
DERMABOND ADVANCED .7 DNX12 (GAUZE/BANDAGES/DRESSINGS) ×2 IMPLANT
DRAPE CHEST BREAST 15X10 FENES (DRAPES) ×4 IMPLANT
ELECT REM PT RETURN 9FT ADLT (ELECTROSURGICAL) ×4
ELECTRODE REM PT RTRN 9FT ADLT (ELECTROSURGICAL) ×2 IMPLANT
GAUZE 4X4 16PLY ~~LOC~~+RFID DBL (SPONGE) ×6 IMPLANT
GAUZE SPONGE 4X4 12PLY STRL (GAUZE/BANDAGES/DRESSINGS) ×2 IMPLANT
GAUZE SPONGE 4X4 16PLY XRAY LF (GAUZE/BANDAGES/DRESSINGS) ×2 IMPLANT
GLOVE SRG 8 PF TXTR STRL LF DI (GLOVE) ×2 IMPLANT
GLOVE SURG ENC MOIS LTX SZ7.5 (GLOVE) ×4 IMPLANT
GLOVE SURG POLY ORTHO LF SZ7.5 (GLOVE) IMPLANT
GLOVE SURG UNDER LTX SZ8 (GLOVE) ×4 IMPLANT
GLOVE SURG UNDER POLY LF SZ8 (GLOVE) ×4
GOWN STRL REUS W/ TWL LRG LVL3 (GOWN DISPOSABLE) ×6 IMPLANT
GOWN STRL REUS W/TWL LRG LVL3 (GOWN DISPOSABLE) ×12
KIT BASIN OR (CUSTOM PROCEDURE TRAY) ×4 IMPLANT
KIT PALINDROME-P 55CM (CATHETERS) IMPLANT
KIT TURNOVER KIT B (KITS) ×4 IMPLANT
NDL 18GX1X1/2 (RX/OR ONLY) (NEEDLE) ×2 IMPLANT
NDL HYPO 25GX1X1/2 BEV (NEEDLE) ×2 IMPLANT
NEEDLE 18GX1X1/2 (RX/OR ONLY) (NEEDLE) ×4 IMPLANT
NEEDLE HYPO 25GX1X1/2 BEV (NEEDLE) ×4 IMPLANT
NS IRRIG 1000ML POUR BTL (IV SOLUTION) ×4 IMPLANT
PACK CV ACCESS (CUSTOM PROCEDURE TRAY) ×4 IMPLANT
PAD ARMBOARD 7.5X6 YLW CONV (MISCELLANEOUS) ×8 IMPLANT
SET MICROPUNCTURE 5F STIFF (MISCELLANEOUS) ×2 IMPLANT
SPONGE SURGIFOAM ABS GEL 100 (HEMOSTASIS) IMPLANT
SPONGE T-LAP 18X18 ~~LOC~~+RFID (SPONGE) ×4 IMPLANT
SUT ETHILON 3 0 PS 1 (SUTURE) ×4 IMPLANT
SUT MNCRL AB 4-0 PS2 18 (SUTURE) ×4 IMPLANT
SUT PROLENE 5 0 C 1 24 (SUTURE) ×4 IMPLANT
SUT PROLENE 6 0 BV (SUTURE) ×10 IMPLANT
SUT VIC AB 3-0 SH 27 (SUTURE) ×4
SUT VIC AB 3-0 SH 27X BRD (SUTURE) ×2 IMPLANT
SYR 10ML LL (SYRINGE) ×4 IMPLANT
TOWEL GREEN STERILE (TOWEL DISPOSABLE) ×8 IMPLANT
TOWEL GREEN STERILE FF (TOWEL DISPOSABLE) ×4 IMPLANT
UNDERPAD 30X36 HEAVY ABSORB (UNDERPADS AND DIAPERS) ×4 IMPLANT
WATER STERILE IRR 1000ML POUR (IV SOLUTION) ×4 IMPLANT

## 2021-04-19 NOTE — Anesthesia Postprocedure Evaluation (Signed)
Anesthesia Post Note  Patient: Daniel [name]  Procedure(s) Performed: INSERTION OF TUNNELED DIALYSIS CATHETER PLICATION OF ANEURYSM LEFT ARTERIOVENOUS FISTULA (Left)     Patient location during evaluation: PACU Anesthesia Type: General Level of consciousness: awake and alert Pain management: pain level controlled Vital Signs Assessment: post-procedure vital signs reviewed and stable Respiratory status: spontaneous breathing, nonlabored ventilation and respiratory function stable Cardiovascular status: stable and blood pressure returned to baseline Anesthetic complications: no   No notable events documented.  Last Vitals:  Vitals:   04/19/21 1250 04/19/21 1318  BP: (!) 108/91 137/79  Pulse: 88 60  Resp: 20 16  Temp: 36.7 C 36.7 C  SpO2: 100% 96%    Last Pain:  Vitals:   04/19/21 1503  TempSrc:   PainSc: Rankin

## 2021-04-19 NOTE — Interval H&P Note (Signed)
History and Physical Interval Note:  04/19/2021 8:41 AM  Daniel [name]  has presented today for surgery, with the diagnosis of End Stage Renal Disease.  The various methods of treatment have been discussed with the patient and family. After consideration of risks, benefits and other options for treatment, the patient has consented to  Procedure(s): INSERTION OF TUNNELED DIALYSIS CATHETER (N/A) PLICATION OF ANEURYSM LEFT ARTERIOVENOUS FISTULA (Left) as a surgical intervention.  The patient's history has been reviewed, patient examined, no change in status, stable for surgery.  I have reviewed the patient's chart and labs.  Questions were answered to the patient's satisfaction.     Deitra Mayo

## 2021-04-19 NOTE — Progress Notes (Signed)
PROGRESS NOTE    MALIEK SCHELLHORN  ZOX:[id number] DOB: 01/30/1961 DOA: 04/14/2021 PCP: [name], NP    Brief Narrative/Hospital Course: [name], 60 y.o. male with PMH of ESRD on TTS HD; DM; HTN; ICH in 2019; and  B/L LE PAD presenting with foot infection.  He was last admitted from 12/12-13 for PAD and had aortogram with cannulation of the R common femoral artery, he does not have revascularization options available and so was to f/u with Dr. Scot Dock in 1-2 weeks to discuss TMA vs. BKA now presents with worsening pain and swelling in his foot. In the ED afebrile vitals stable labs with creatinine 8.6, CRP 1.7 lactic acid 1.6, normal WBC count anemia 8.9 g, xray- are fractures through the bases of the third, fourth, and fifth proximal phalanges, unchanged since March 30, 2021 \\We  were consulted, placed on vancomycin,Flagyl and ceftriaxone and admitted for further management.   Subjective: Patient mentions that the pain in his left leg is better controlled.  Complains of loose stools.  He has had 3 so far today.  Denies any chest pain shortness of breath.  No abdominal pain.  Denies any nausea or vomiting.   Assessment & Plan:  Diabetic foot infection of the left foot Bilateral lower extremity PVD Patient was seen by vascular surgery.  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   Patient was on vancomycin ceftriaxone and metronidazole.  These appear to have been discontinued.   Pain is reasonably well controlled at this time.  Essential hypertension Remains on amlodipine and clonidine.  Blood pressure is well controlled.  Diarrhea Does not appear that he has been receiving any laxatives.  His abdomen is benign.  Given Imodium.  Could be due to antibiotics.  Probiotics.  ESRD on HD TTS Being dialyzed off schedule.  Nephrology is following. 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  Plan is for plication of this aneurysm  today.  Metabolic bone disease Continue home meds, PhosLo monitor calcium phosphorus  Anemia of renal disease Patient was transfused 2 units of PRBC with hemodialysis on 12/21.  Hemoglobin noted to be drifting down.  No overt bleeding noted.  Continue to monitor.  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   Just on SSI.  Continue to monitor.    Obesity Estimated body mass index is 37.97 kg/m as calculated from the following:   Height as of this encounter: 5\' 11"  (1.803 m).   Weight as of this encounter: 123.5 kg.   DVT prophylaxis: heparin injection 5,000 Units Start: 04/14/21 1400 Code Status:   Code Status: Full Code Family Communication: Discussed with patient Disposition: Inpatient rehabilitation recommended by physical therapy   Status is: Inpatient  Remains inpatient appropriate because: Status post left BKA.  Anemia.  End-stage renal disease.   Objective: Vitals last 24 hrs: Vitals:   04/18/21 2310 04/19/21 0500 04/19/21 0738 04/19/21 0838  BP:  140/60 (!) 142/77 131/76  Pulse:  84 100 83  Resp:  16 19 20   Temp:  98.2 F (36.8 C) 98 F (36.7 C) 98.1 F (36.7 C)  TempSrc:  Oral Oral Oral  SpO2:  99% 100% 97%  Weight:      Height: 5\' 11"  (1.803 m)      Weight change:   Intake/Output Summary (Last 24 hours) at 04/19/2021 1109 Last data filed at 04/19/2021 0005 Gross per 24  hour  Intake 260 ml  Output --  Net 260 ml    Net IO Since Admission: -2,702.29 mL [04/19/21 1109]    Physical Examination:  General appearance: Awake alert.  In no distress Resp: Clear to auscultation bilaterally.  Normal effort Cardio: S1-S2 is normal regular.  No S3-S4.  No rubs murmurs or bruit GI: Abdomen is soft.  Nontender nondistended.  Bowel sounds are present normal.  No masses organomegaly Extremities: Left lower extremity covered in dressing Neurologic: Alert and  oriented x3.  No focal neurological deficits.       Medications reviewed:  Scheduled Meds:  [MAR Hold] sodium chloride   Intravenous Once   [MAR Hold] acetaminophen  650 mg Oral Once   [MAR Hold] amLODipine  10 mg Oral QHS   [MAR Hold] calcium acetate  1,334 mg Oral TID WC   [MAR Hold] Chlorhexidine Gluconate Cloth  6 each Topical Q0600   [MAR Hold] cloNIDine  0.1 mg Oral Once per day on Tue Thu Sat   [facility] Hold] cloNIDine  0.1 mg Oral 2 times per day on Sun Mon Wed Fri   [facility] Hold] diphenhydrAMINE  25 mg Intravenous Once   [MAR Hold] feeding supplement (NEPRO CARB STEADY)  237 mL Oral BID BM   [MAR Hold] gabapentin  100 mg Oral TID   [MAR Hold] heparin  5,000 Units Subcutaneous Q8H   [MAR Hold] insulin aspart  0-6 Units Subcutaneous TID WC   [MAR Hold] multivitamin  1 tablet Oral QHS   [MAR Hold] nicotine  14 mg Transdermal Daily   [MAR Hold] pantoprazole  40 mg Oral BID   [MAR Hold] QUEtiapine  50 mg Oral QHS   Continuous Infusions:  sodium chloride 10 mL/hr at 04/19/21 1004   ceFAZolin      Diet Order             Diet NPO time specified Except for: Sips with Meds  Diet effective midnight                   Weight change:   Wt Readings from Last 3 Encounters:  04/17/21 123.5 kg  04/09/21 127.2 kg  04/04/21 134.3 kg     Consultants:see note  Procedures:see note   Antimicrobials: Anti-infectives (From admission, onward)    Start     Dose/Rate Route Frequency Ordered Stop   04/19/21 0845  ceFAZolin (ANCEF) IVPB 3g/100 mL premix        3 g 200 mL/hr over 30 Minutes Intravenous  Once 04/19/21 0840 04/19/21 1027   04/19/21 0841  ceFAZolin (ANCEF) 2-4 GM/100ML-% IVPB       Note to Pharmacy: Cameron Sprang M: cabinet override      04/19/21 0841 04/19/21 2044   04/17/21 1200  vancomycin (VANCOCIN) IVPB 1000 mg/200 mL premix        1,000 mg 200 mL/hr over 60 Minutes Intravenous Every Wed (Hemodialysis) 04/16/21 1339 04/17/21 1443   04/16/21 0200  vancomycin  (VANCOCIN) IVPB 1000 mg/200 mL premix        1,000 mg 200 mL/hr over 60 Minutes Intravenous  Once 04/15/21 2103 04/16/21 0625   04/15/21 1220  vancomycin variable dose per unstable renal function (pharmacist dosing)  Status:  Discontinued         Does not apply See admin instructions 04/15/21 1220 04/17/21 0724   04/14/21 1130  vancomycin (VANCOCIN) 2,500 mg in sodium chloride 0.9 % 500 mL IVPB        2,500 mg  250 mL/hr over 120 Minutes Intravenous  Once 04/14/21 1120 04/14/21 2006   04/14/21 1115  cefTRIAXone (ROCEPHIN) 2 g in sodium chloride 0.9 % 100 mL IVPB        2 g 200 mL/hr over 30 Minutes Intravenous Every 24 hours 04/14/21 1109 04/17/21 2359   04/14/21 1115  metroNIDAZOLE (FLAGYL) IVPB 500 mg        500 mg 100 mL/hr over 60 Minutes Intravenous Every 8 hours 04/14/21 1109 04/17/21 2359   04/14/21 0945  piperacillin-tazobactam (ZOSYN) IVPB 2.25 g  Status:  Discontinued        2.25 g 100 mL/hr over 30 Minutes Intravenous Every 8 hours 04/14/21 0940 04/14/21 1109      Culture/Microbiology    Component Value Date/Time   SDES BLOOD RIGHT HAND 04/14/2021 1035   SPECREQUEST  04/14/2021 1035    BOTTLES DRAWN AEROBIC AND ANAEROBIC Blood Culture adequate volume   CULT  04/14/2021 1035    NO GROWTH 5 DAYS Performed at [facility] [address] [place], [place] [address]    REPTSTATUS 04/19/2021 FINAL 04/14/2021 1035    Other culture-see note  Unresulted Labs (From admission, onward)     Start     Ordered   Pending  Basic metabolic panel  Daily,   R      Pending   Pending  CBC  Daily,   R      Pending   Pending  CBC  (heparin)  Once,   R       Comments: Baseline for heparin therapy IF NOT ALREADY DRAWN.  Notify MD if PLT < 100 K.    Pending   Pending  Creatinine, serum  (heparin)  Once,   R       Comments: Baseline for heparin therapy IF NOT ALREADY DRAWN.    Pending            Data Reviewed: I have personally reviewed following labs and imaging  studies  CBC: Recent Labs  Lab 04/14/21 0804 04/15/21 0508 04/16/21 0435 04/17/21 0334 04/18/21 0345 04/19/21 0236  WBC 6.2 5.6 5.1 11.2* 9.1 5.9  NEUTROABS 3.3  --   --  8.5*  --   --   HGB 8.9* 9.0* 8.9* 6.7* 7.8* 7.3*  HCT 28.9* 28.9* 28.3* 21.2* 24.1* 23.1*  MCV 108.6* 109.1* 105.2* 108.2* 101.3* 103.1*  PLT 203 195 164 155 156 147*    Basic Metabolic Panel: Recent Labs  Lab 04/15/21 0508 04/16/21 0435 04/17/21 0334 04/18/21 1206 04/19/21 0236  NA 135 133* 131* 135 136  K 5.1 3.6 6.0* 3.9 4.1  CL 97* 95* 96* 97* 99  CO2 28 28 24 28 27   GLUCOSE 157* 184* 159* 132* 158*  BUN 29* 15 27* 23 32*  CREATININE 10.64* 5.82* 8.43* 7.03* 8.31*  CALCIUM 8.9 8.7* 8.4* 8.5* 8.3*  PHOS  --   --  3.6 2.5 3.1    GFR: Estimated Creatinine Clearance: 12.5 mL/min (A) (by C-G formula based on SCr of 8.31 mg/dL (H)).  Liver Function Tests: Recent Labs  Lab 04/17/21 0334 04/18/21 1206 04/19/21 0236  ALBUMIN 2.2* 2.5* 2.1*     CBG: Recent Labs  Lab 04/18/21 1203 04/18/21 1616 04/18/21 2035 04/19/21 0619 04/19/21 0815  GLUCAP 123* 127* 150* 103* 89     Recent Results (from the past 240 hour(s))  Resp Panel by RT-PCR (Flu A&B, Covid) Nasopharyngeal Swab     Status: None   Collection Time: 04/14/21 10:25  AM   Specimen: Nasopharyngeal Swab; Nasopharyngeal(NP) swabs in vial transport medium  Result Value Ref Range Status   SARS Coronavirus 2 by RT PCR NEGATIVE NEGATIVE Final    Comment: (NOTE) SARS-CoV-2 target nucleic acids are NOT DETECTED.  The SARS-CoV-2 RNA is generally detectable in upper respiratory specimens during the acute phase of infection. The lowest concentration of SARS-CoV-2 viral copies this assay can detect is 138 copies/mL. A negative result does not preclude SARS-Cov-2 infection and should not be used as the sole basis for treatment or other patient management decisions. A negative result may occur with  improper specimen collection/handling,  submission of specimen other than nasopharyngeal swab, presence of viral mutation(s) within the areas targeted by this assay, and inadequate number of viral copies(<138 copies/mL). A negative result must be combined with clinical observations, patient history, and epidemiological information. The expected result is Negative.  Fact Sheet for Patients:  EntrepreneurPulse.com.au  Fact Sheet for Healthcare Providers:  IncredibleEmployment.be  This test is no t yet approved or cleared by the [place] FDA and  has been authorized for detection and/or diagnosis of SARS-CoV-2 by FDA under an Emergency Use Authorization (EUA). This EUA will remain  in effect (meaning this test can be used) for the duration of the COVID-19 declaration under Section 564(b)(1) of the Act, 21 U.S.C.section 360bbb-3(b)(1), unless the authorization is terminated  or revoked sooner.       Influenza A by PCR NEGATIVE NEGATIVE Final   Influenza B by PCR NEGATIVE NEGATIVE Final    Comment: (NOTE) The Xpert Xpress SARS-CoV-2/FLU/RSV plus assay is intended as an aid in the diagnosis of influenza from Nasopharyngeal swab specimens and should not be used as a sole basis for treatment. Nasal washings and aspirates are unacceptable for Xpert Xpress SARS-CoV-2/FLU/RSV testing.  Fact Sheet for Patients: EntrepreneurPulse.com.au  Fact Sheet for Healthcare Providers: IncredibleEmployment.be  This test is not yet approved or cleared by the [place] FDA and has been authorized for detection and/or diagnosis of SARS-CoV-2 by FDA under an Emergency Use Authorization (EUA). This EUA will remain in effect (meaning this test can be used) for the duration of the COVID-19 declaration under Section 564(b)(1) of the Act, 21 U.S.C. section 360bbb-3(b)(1), unless the authorization is terminated or revoked.  Performed at [facility], [place] [address] [place], [place] [address]   Blood culture (routine x 2)     Status: None   Collection Time: 04/14/21 10:30 AM   Specimen: BLOOD  Result Value Ref Range Status   Specimen Description BLOOD RIGHT ANTECUBITAL  Final   Special Requests   Final    BOTTLES DRAWN AEROBIC AND ANAEROBIC Blood Culture adequate volume   Culture   Final    NO GROWTH 5 DAYS Performed at [facility], [place] [address], [place], [place] [address]    Report Status 04/19/2021 FINAL  Final  Blood culture (routine x 2)     Status: None   Collection Time: 04/14/21 10:35 AM   Specimen: BLOOD RIGHT HAND  Result Value Ref Range Status   Specimen Description BLOOD RIGHT HAND  Final   Special Requests   Final    BOTTLES DRAWN AEROBIC AND ANAEROBIC Blood Culture adequate volume   Culture   Final    NO GROWTH 5 DAYS Performed at [facility], [place] [address] [place], [place] [address]    Report Status 04/19/2021 FINAL  Final  Surgical pcr screen     Status: Abnormal  Collection Time: 04/15/21  4:48 PM   Specimen: Nasal Mucosa; Nasal Swab  Result Value Ref Range Status   MRSA, PCR NEGATIVE NEGATIVE Final   Staphylococcus aureus POSITIVE (A) NEGATIVE Final    Comment: (NOTE) The Xpert SA Assay (FDA approved for NASAL specimens in patients 24 years of age and older), is one component of a comprehensive surveillance program. It is not intended to diagnose infection nor to guide or monitor treatment. Performed at [facility], [place] [address], [place], [place] [address]       Radiology Studies: HYBRID OR IMAGING ([facility])  Result Date: 04/19/2021 There is no interpretation for this exam.  This order is for images obtained during a surgical procedure.  Please See "Surgeries" Tab for more information regarding the procedure.     LOS: 5 days   Bonnielee Haff, MD [facility]  04/19/2021, 11:09 AM

## 2021-04-19 NOTE — Discharge Instructions (Signed)
° °  [facility]  Discharge Instructions  AV Fistula or Graft Surgery for Dialysis Access  Please refer to the following instructions for your post-procedure care. Your surgeon or physician assistant will discuss any changes with you.  Activity  You may drive the day following your surgery, if you are comfortable and no longer taking prescription pain medication. Resume full activity as the soreness in your incision resolves.  Bathing/Showering  You may shower after you go home. Keep your incision dry for 48 hours. Do not soak in a bathtub, hot tub, or swim until the incision heals completely. You may not shower if you have a hemodialysis catheter.  Incision Care  Clean your incision with mild soap and water after 48 hours. Pat the area dry with a clean towel. You do not need a bandage unless otherwise instructed. Do not apply any ointments or creams to your incision. You may have skin glue on your incision. Do not peel it off. It will come off on its own in about one week. Your arm may swell a bit after surgery. To reduce swelling use pillows to elevate your arm so it is above your heart. Your doctor will tell you if you need to lightly wrap your arm with an ACE bandage.  Diet  Resume your normal diet. There are not special food restrictions following this procedure. In order to heal from your surgery, it is CRITICAL to get adequate nutrition. Your body requires vitamins, minerals, and protein. Vegetables are the best source of vitamins and minerals. Vegetables also provide the perfect balance of protein. Processed food has little nutritional value, so try to avoid this.  Medications  Resume taking all of your medications. If your incision is causing pain, you may take over-the counter pain relievers such as acetaminophen (Tylenol). If you were prescribed a stronger pain medication, please be aware these medications can cause nausea and constipation. Prevent  nausea by taking the medication with a snack or meal. Avoid constipation by drinking plenty of fluids and eating foods with high amount of fiber, such as fruits, vegetables, and grains.  Do not take Tylenol if you are taking prescription pain medications.  Follow up Your surgeon may want to see you in the office following your access surgery. If so, this will be arranged at the time of your surgery.  Please call us immediately for any of the following conditions:  Increased pain, redness, drainage (pus) from your incision site Fever of 101 degrees or higher Severe or worsening pain at your incision site Hand pain or numbness.  Reduce your risk of vascular disease:  Stop smoking. If you would like help, call [facility] at 1-800-QUIT-NOW (782)593-2806) or [place] at [place] your cholesterol Maintain a desired weight Control your diabetes Keep your blood pressure down  Dialysis  It will take several weeks to several months for your new dialysis access to be ready for use. Your surgeon will determine when it is okay to use it. Your nephrologist will continue to direct your dialysis. You can continue to use your Permcath until your new access is ready for use.   04/19/2021 Daniel [name] [id number] [birth year]  Surgeon(s): Angelia Mould, MD  Procedure(s): INSERTION OF TUNNELED DIALYSIS CATHETER PLICATION OF ANEURYSM LEFT ARTERIOVENOUS FISTULA  x Do not stick fistula for 8 weeks    If you have any questions, please call the office at [phone].

## 2021-04-19 NOTE — Plan of Care (Signed)

## 2021-04-19 NOTE — Anesthesia Preprocedure Evaluation (Addendum)
Anesthesia Evaluation  Patient identified by MRN, date of birth, ID band Patient awake    Reviewed: Allergy & Precautions, NPO status , Patient's Chart, lab work & pertinent test results  History of Anesthesia Complications Negative for: history of anesthetic complications  Airway Mallampati: III  TM Distance: >3 FB Neck ROM: Full    Dental  (+) Dental Advisory Given   Pulmonary Current Smoker and Patient abstained from smoking.,    Pulmonary exam normal        Cardiovascular hypertension, Pt. on medications + Peripheral Vascular Disease  Normal cardiovascular exam   '22 TTE - EF 60 to 65%. The left ventricular internal cavity size was mildly dilated. There is moderate left ventricular hypertrophy. Left atrial size was mildly dilated. Mild MR    Neuro/Psych CVA negative psych ROS   GI/Hepatic PUD, (+)     substance abuse  alcohol use and cocaine use, Hepatitis -  Endo/Other  diabetes, Type 2 Obesity   Renal/GU Dialysis and ESRFRenal disease     Musculoskeletal negative musculoskeletal ROS (+)   Abdominal   Peds  Hematology  (+) anemia ,  Plt 147k    Anesthesia Other Findings   Reproductive/Obstetrics                            Anesthesia Physical Anesthesia Plan  ASA: 3  Anesthesia Plan: General   Post-op Pain Management:    Induction: Intravenous  PONV Risk Score and Plan: 2 and Treatment may vary due to age or medical condition, Ondansetron, Dexamethasone and Midazolam  Airway Management Planned: LMA  Additional Equipment: None  Intra-op Plan:   Post-operative Plan: Extubation in OR  Informed Consent: I have reviewed the patients History and Physical, chart, labs and discussed the procedure including the risks, benefits and alternatives for the proposed anesthesia with the patient or authorized representative who has indicated his/her understanding and acceptance.      Dental advisory given  Plan Discussed with: CRNA and Anesthesiologist  Anesthesia Plan Comments:        Anesthesia Quick Evaluation

## 2021-04-19 NOTE — Op Note (Signed)
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 Scot Dock, MD  ASSIST: Liana Crocker, PA  ANESTHESIA: General  EBL: Minimal  INDICATIONS:    Daniel [name] is a 60 y.o. male who developed a an ulceration overlying an aneurysm in his left upper arm fistula that was at risk for bleeding.  I recommended plication.  There would not be enough room to stick the fistula once this was done until this had healed and therefore elected to also place a catheter.    TECHNIQUE:   The patient was taken to the operating room and received a general anesthetic.  Both IJ's appeared to be patent.  The neck and upper chest were prepped and draped in usual sterile fashion.  Under ultrasound guidance, after the skin was anesthetized, I cannulated the right IJ with a micropuncture needle and a micropuncture sheath was introduced over a wire.  I then advanced the J-wire into the right atrium.  The exit site for the cath was selected and an incision made with 11 blade.  The catheter was tunneled from this incision up to the neck incision.  The tract over the wire was dilated and then the dilator and peel-away sheath were advanced over the wire and the wire and dilator removed.  The catheter was passed through the peel-away sheath and positioned at the cavoatrial junction.  The catheter was secured at its exit site with a 3-0 nylon suture.  The IJ cannulation site was closed with 4-0 Monocryl.  Both ports withdrew easily were then flushed with heparinized saline.  Attention was then turned to the left arm.  The left arm was prepped and draped in usual sterile fashion.  An elliptical incision was made  approximately 3 times the length as the width.  The aneurysm was dissected free circumferentially.  The patient was heparinized.  I then excised a large ellipse of the aneurysm after the fistula was clamped proximally and distally.  The vein was then sewn back with running 5-0 Prolene suture.  Hemostasis was obtained in the wound.  The heparin was partially reversed with protamine.  The wound was then closed with a deep layer of 3-0 Vicryl and the skin closed with 4-0 Monocryl.  Dermabond was applied.  The patient tolerated the procedure well was transferred recovery room in stable condition.  All needle and sponge counts were correct.  Given the complexity of the case a first assistant was necessary in order to expedient the procedure and safely perform the technical aspects of the operation.  Deitra Mayo, MD, FACS [facility]  DATE OF DICTATION:   04/19/2021

## 2021-04-19 NOTE — Transfer of Care (Signed)
Immediate Anesthesia Transfer of Care Note  Patient: Daniel [name]  Procedure(s) Performed: INSERTION OF TUNNELED DIALYSIS CATHETER PLICATION OF ANEURYSM LEFT ARTERIOVENOUS FISTULA (Left)  Patient Location: PACU  Anesthesia Type:General  Level of Consciousness: awake, alert  and oriented  Airway & Oxygen Therapy: Patient Spontanous Breathing  Post-op Assessment: Report given to RN and Post -op Vital signs reviewed and stable  Post vital signs: Reviewed and stable  Last Vitals:  Vitals Value Taken Time  BP 116/100 04/19/21 1221  Temp    Pulse 85 04/19/21 1227  Resp 11 04/19/21 1227  SpO2 100 % 04/19/21 1227  Vitals shown include unvalidated device data.  Last Pain:  Vitals:   04/19/21 0838  TempSrc: Oral  PainSc: 0-No pain      Patients Stated Pain Goal: 2 (31/49/70 2637)  Complications: No notable events documented.

## 2021-04-19 NOTE — Progress Notes (Addendum)
[facility] Progress Note   Subjective: Seen in room. No C/Os. HD tomorrow on schedule.  He looks better than he's ever looked but has been in hospital for quite a number of days. He is basically homeless and will need assistance on discharge finding an appropriate place to live.   Objective Vitals:   04/19/21 1220 04/19/21 1235 04/19/21 1250 04/19/21 1318  BP: (!) 116/100 115/76 (!) 108/91 137/79  Pulse: 70 83 88 60  Resp: 17 14 20 16   Temp: 98.1 F (36.7 C)  98.1 F (36.7 C) 98 F (36.7 C)  TempSrc:    Oral  SpO2: 100% 100% 100% 96%  Weight:      Height:       Physical Exam General: Chronically ill appearing male looks older than stated age in NAD Heart: S1,S2 no R/G Lungs: CTAB Abdomen: NABS, NT Extremities: L BKA No RLE edema Dialysis Access: L AVF +T/B, surgical glue intact. RIJ TDC drsg intact.    Additional Objective Labs: Basic Metabolic Panel: Recent Labs  Lab 04/17/21 0334 04/18/21 1206 04/19/21 0236  NA 131* 135 136  K 6.0* 3.9 4.1  CL 96* 97* 99  CO2 24 28 27   GLUCOSE 159* 132* 158*  BUN 27* 23 32*  CREATININE 8.43* 7.03* 8.31*  CALCIUM 8.4* 8.5* 8.3*  PHOS 3.6 2.5 3.1   Liver Function Tests: Recent Labs  Lab 04/17/21 0334 04/18/21 1206 04/19/21 0236  ALBUMIN 2.2* 2.5* 2.1*   No results for input(s): LIPASE, AMYLASE in the last 168 hours. CBC: Recent Labs  Lab 04/14/21 0804 04/15/21 0508 04/16/21 0435 04/17/21 0334 04/18/21 0345 04/19/21 0236  WBC 6.2 5.6 5.1 11.2* 9.1 5.9  NEUTROABS 3.3  --   --  8.5*  --   --   HGB 8.9* 9.0* 8.9* 6.7* 7.8* 7.3*  HCT 28.9* 28.9* 28.3* 21.2* 24.1* 23.1*  MCV 108.6* 109.1* 105.2* 108.2* 101.3* 103.1*  PLT 203 195 164 155 156 147*   Blood Culture    Component Value Date/Time   SDES BLOOD RIGHT HAND 04/14/2021 1035   SPECREQUEST  04/14/2021 1035    BOTTLES DRAWN AEROBIC AND ANAEROBIC Blood Culture adequate volume   CULT  04/14/2021 1035    NO GROWTH 5 DAYS Performed at [facility], [place] [address], [place], [place] [address]    REPTSTATUS 04/19/2021 FINAL 04/14/2021 1035    Cardiac Enzymes: No results for input(s): CKTOTAL, CKMB, CKMBINDEX, TROPONINI in the last 168 hours. CBG: Recent Labs  Lab 04/18/21 2035 04/19/21 0619 04/19/21 0815 04/19/21 1233 04/19/21 1327  GLUCAP 150* 103* 89 93 71   Iron Studies: No results for input(s): IRON, TIBC, TRANSFERRIN, FERRITIN in the last 72 hours. @lablastinr3 @ Studies/Results: HYBRID OR IMAGING ([facility] ONLY)  Result Date: 04/19/2021 There is no interpretation for this exam.  This order is for images obtained during a surgical procedure.  Please See "Surgeries" Tab for more information regarding the procedure.   Medications:  ceFAZolin      sodium chloride   Intravenous Once   acetaminophen  650 mg Oral Once   amLODipine  10 mg Oral QHS   calcium acetate  1,334 mg Oral TID WC   Chlorhexidine Gluconate Cloth  6 each Topical Q0600   cloNIDine  0.1 mg Oral Once per day on Tue Thu Sat   cloNIDine  0.1 mg Oral 2 times per day on Sun Mon Wed Fri   diphenhydrAMINE  25 mg Intravenous Once   feeding supplement (  NEPRO CARB STEADY)  237 mL Oral BID BM   fentaNYL       gabapentin  100 mg Oral TID   heparin  5,000 Units Subcutaneous Q8H   insulin aspart  0-6 Units Subcutaneous TID WC   multivitamin  1 tablet Oral QHS   nicotine  14 mg Transdermal Daily   ondansetron       pantoprazole  40 mg Oral BID   QUEtiapine  50 mg Oral QHS   saccharomyces boulardii  250 mg Oral BID     Dialysis Orders: TTS - [place] 4hrs, BFR 450, DFR 500,  TMA263FH, 2K/ 2Ca Mircera 225 mcg IV q2wks - last 04/06/21 No Systemic Heparin Hectorol 1mcg IV with HD-04/13/21 Venofer 100mg  IV X 6 doses-last 04/13/21   Assessment/Plan: Diabetic L foot infection/PVD-Underwent LE angiography 04/08/21 showed limb ischemia L leg. 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 Scot Dock. Continue ABX. Aneurysmal AVF: Went for plication of AVF and  placement of TDC per Dr. Scot Dock this AM.  ESRD - T,Th,S -HD off schedule earlier this week. Next HD 04/20/2021. K+ 4.1.   Hypertension/volume  - Patient euvolemic on exam. Bps stable. Continue current anti-hypertensive regimen, UF as tolerated. Will need lower EDW post BKA on discharge.  Anemia of CKD - S/P 2 units PRBCs 04/17/21. HGB still 7.3 today. Will transfuse 2 units PRBCs tomorrow with HD.  Secondary Hyperparathyroidism - Ca ok, PO4 at goal.   Nutrition - renal diet with fluid restrictions. Add protein supps.   Disposition: Patient has issues with being essentially homeless. He says he is living in a boarding house but has told me at OP clinic that this place isn't well heated and he says today it would be hard for him to live there. Will consult MSW to assist with finding a place for him to live post discharge.   Alexya Mcdaris H. Sarahi Borland NP-C 04/19/2021, 2:01 PM  [employer] [phone]

## 2021-04-19 NOTE — Anesthesia Procedure Notes (Signed)
Procedure Name: LMA Insertion Date/Time: 04/19/2021 10:18 AM Performed by: Griffin Dakin, CRNA Pre-anesthesia Checklist: Patient identified, Emergency Drugs available, Suction available and Patient being monitored Patient Re-evaluated:Patient Re-evaluated prior to induction Oxygen Delivery Method: Circle system utilized Preoxygenation: Pre-oxygenation with 100% oxygen Induction Type: IV induction LMA: LMA inserted LMA Size: 5.0 Number of attempts: 1 Placement Confirmation: positive ETCO2 and breath sounds checked- equal and bilateral Tube secured with: Tape Dental Injury: Teeth and Oropharynx as per pre-operative assessment

## 2021-04-19 NOTE — Progress Notes (Signed)
Inpatient Rehab Admissions Coordinator:   Met with pt to discuss CIR recommendations and goals/expectations of CIR stay.  We reviewed estimated length of stay 7-10 days with goals of supervision level for mobility and ADLs.  Pt reports he lives in a boarding house and does not have anyone who could provide assist at discharge.  He is open to SNF for rehab.  Will let TOC team know and sign off.   Shann Medal, PT, DPT Admissions Coordinator [phone] 04/19/21  2:04 PM

## 2021-04-19 NOTE — Progress Notes (Signed)
PT Cancellation Note  Patient Details Name: PLATON AROCHO MRN: [id number] DOB: 07-06-1960   Cancelled Treatment:    Reason Eval/Treat Not Completed: Patient at procedure or test/unavailable (Pt in surgery for new dialysis catheter. Will return as able.)   Alvira Philips 04/19/2021, 8:52 AM Azazel Franze M,PT Acute Rehab Services [phone] (pager)

## 2021-04-19 NOTE — Progress Notes (Signed)
Pt back from OR. Patient is A&O*3. Able to verbalize his needs. Skin glue noted to the left upper arm and HDS cath noted to the right chest. Bed kept in low position and locked. Call bell in reach. Will contd to monitor.

## 2021-04-19 NOTE — Progress Notes (Signed)
Pt transported to OR via bed at this time. Patient hemodynamically stable during transport.

## 2021-04-20 ENCOUNTER — Encounter (HOSPITAL_COMMUNITY): Payer: Self-pay | Admitting: Vascular Surgery

## 2021-04-20 DIAGNOSIS — E118 Type 2 diabetes mellitus with unspecified complications: Secondary | ICD-10-CM | POA: Diagnosis not present

## 2021-04-20 DIAGNOSIS — E11628 Type 2 diabetes mellitus with other skin complications: Secondary | ICD-10-CM | POA: Diagnosis not present

## 2021-04-20 DIAGNOSIS — I10 Essential (primary) hypertension: Secondary | ICD-10-CM | POA: Diagnosis not present

## 2021-04-20 DIAGNOSIS — L089 Local infection of the skin and subcutaneous tissue, unspecified: Secondary | ICD-10-CM | POA: Diagnosis not present

## 2021-04-20 LAB — GLUCOSE, CAPILLARY
Glucose-Capillary: 116 mg/dL — ABNORMAL HIGH (ref 70–99)
Glucose-Capillary: 150 mg/dL — ABNORMAL HIGH (ref 70–99)
Glucose-Capillary: 122 mg/dL — ABNORMAL HIGH (ref 70–99)
Glucose-Capillary: 116 mg/dL — ABNORMAL HIGH (ref 70–99)

## 2021-04-20 LAB — CBC
HCT: 22.7 % — ABNORMAL LOW (ref 39.0–52.0)
Hemoglobin: 7 g/dL — ABNORMAL LOW (ref 13.0–17.0)
MCH: 32.4 pg (ref 26.0–34.0)
MCHC: 30.8 g/dL (ref 30.0–36.0)
MCV: 105.1 fL — ABNORMAL HIGH (ref 80.0–100.0)
Platelets: 144 10*3/uL — ABNORMAL LOW (ref 150–400)
RBC: 2.16 MIL/uL — ABNORMAL LOW (ref 4.22–5.81)
RDW: 21.6 % — ABNORMAL HIGH (ref 11.5–15.5)
WBC: 6.8 10*3/uL (ref 4.0–10.5)
nRBC: 4.8 % — ABNORMAL HIGH (ref 0.0–0.2)

## 2021-04-20 MED ORDER — ALTEPLASE 2 MG IJ SOLR: 2.0000 mg | Freq: Once | INTRAMUSCULAR | Status: DC | PRN

## 2021-04-20 MED ORDER — LIDOCAINE HCL (PF) 1 % IJ SOLN: 5.0000 mL | INTRAMUSCULAR | Status: DC | PRN

## 2021-04-20 MED ORDER — SODIUM CHLORIDE 0.9 % IV SOLN: 100.0000 mL | INTRAVENOUS | Status: DC | PRN

## 2021-04-20 MED ORDER — HEPARIN SODIUM (PORCINE) 1000 UNIT/ML DIALYSIS
1000.0000 [IU] | INTRAMUSCULAR | Status: DC | PRN
  Filled 2021-04-20: qty 1

## 2021-04-20 MED ORDER — LIDOCAINE-PRILOCAINE 2.5-2.5 % EX CREA: 1.0000 "application " | TOPICAL_CREAM | CUTANEOUS | Status: DC | PRN

## 2021-04-20 MED ORDER — PENTAFLUOROPROP-TETRAFLUOROETH EX AERO: 1.0000 "application " | INHALATION_SPRAY | CUTANEOUS | Status: DC | PRN

## 2021-04-20 NOTE — Progress Notes (Addendum)
PROGRESS NOTE    Daniel [name]  LNL:[id number] DOB: 07-27-1960 DOA: 04/14/2021 PCP: Daniel Perna, NP    Brief Narrative/Hospital Course: Daniel [name], 60 y.o. male with PMH of ESRD on TTS HD; DM; HTN; ICH in 2019; and  B/L LE PAD presenting with foot infection.  He was last admitted from 12/12-13 for PAD and had aortogram with cannulation of the R common femoral artery, he does not have revascularization options available and so was to f/u with Dr. Scot Dock in 1-2 weeks to discuss TMA vs. BKA now presents with worsening pain and swelling in his foot. In the ED afebrile vitals stable labs with creatinine 8.6, CRP 1.7 lactic acid 1.6, normal WBC count anemia 8.9 g, xray- are fractures through the bases of the third, fourth, and fifth proximal phalanges, unchanged since March 30, 2021 \\We  were consulted, placed on vancomycin,Flagyl and ceftriaxone and admitted for further management.   Subjective: Patient mentions that he has been experiencing occasional jerking movements of his upper extremities.  Not consistently present.  Denies any other complaints.  Pain in the left stump area is about 7 out of 10 in intensity.  No chest pain or shortness of breath.    Assessment & Plan:  Diabetic foot infection of the left foot Bilateral lower extremity PVD Patient was seen by vascular surgery.  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   Patient was on vancomycin ceftriaxone and metronidazole.  These appear to have been discontinued.   Unclear control at this time.  Continue to monitor.  Essential hypertension Remains on amlodipine and clonidine.  Blood pressure is reasonably well controlled.  Diarrhea Abdomen has been benign.  Could have been secondary to antibiotics.  Imodium as needed.  Probiotics.  Appears to be subsiding.    ESRD on HD TTS Being dialyzed off schedule.  Nephrology is following. 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    Metabolic bone disease Continue home meds, PhosLo monitor calcium phosphorus  Anemia of renal disease Patient was transfused 2 units of PRBC with hemodialysis on 12/21.  Hemoglobin has been drifting down.  Down to 7.0.  No overt bleeding noted.  Has not had any melanotic stools or blood in the stool.  No bleeding noted from the stump area.  This could all be reflective of his kidney disease.  To be transfused additional 2 units of PRBC with hemodialysis today.  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   Just on SSI.  Continue to monitor.  Involuntary movement of upper extremities He is reporting occasional jerking movements of his upper extremities.  Noted to be on Neurontin which can cause tremors.  He is noted to be on Seroquel which can cause tardive dyskinesia and dystonia.  We will stop the Seroquel.    Obesity Estimated body mass index is 39.51 kg/m as calculated from the following:   Height as of this encounter: 5\' 11"  (1.803 m).   Weight as of this encounter: 128.5 kg.   DVT prophylaxis: heparin injection 5,000 Units Start: 04/14/21 1400 Code Status:   Code Status: Full Code Family Communication: Discussed with patient Disposition: Skilled nursing facility is being pursued   Status is: Inpatient  Remains inpatient appropriate because: Status post left BKA.  Anemia.  End-stage renal disease.   Objective: Vitals last 24 hrs: Vitals:   04/20/21 0830 04/20/21 0900 04/20/21 0930  04/20/21 1000  BP: 135/75 103/78 115/72 121/78  Pulse: 91 99 (!) 101 96  Resp:  11 18   Temp:   99.1 F (37.3 C)   TempSrc:   Oral   SpO2:   97%   Weight:      Height:       Weight change:   Intake/Output Summary (Last 24 hours) at 04/20/2021 1012 Last data filed at 04/19/2021 1700 Gross per 24 hour  Intake 360 ml  Output --  Net 360 ml    Net IO Since Admission:  -2,342.29 mL [04/20/21 1012]    Physical Examination:  General appearance: Awake alert.  In no distress Resp: Clear to auscultation bilaterally.  Normal effort Cardio: S1-S2 is normal regular.  No S3-S4.  No rubs murmurs or bruit GI: Abdomen is soft.  Nontender nondistended.  Bowel sounds are present normal.  No masses organomegaly Extremities: Left lower extremity stump is covered in dressing.  No bleeding noted. Neurologic: Alert and oriented x3.  No focal neurological deficits.       Medications reviewed:  Scheduled Meds:  (feeding supplement) PROSource Plus  30 mL Oral BID BM   sodium chloride   Intravenous Once   acetaminophen  650 mg Oral Once   amLODipine  10 mg Oral QHS   calcium acetate  1,334 mg Oral TID WC   Chlorhexidine Gluconate Cloth  6 each Topical Q0600   cloNIDine  0.1 mg Oral Once per day on Tue Thu Sat   cloNIDine  0.1 mg Oral 2 times per day on Sun Mon Wed Fri   diphenhydrAMINE  25 mg Intravenous Once   feeding supplement (NEPRO CARB STEADY)  237 mL Oral BID BM   gabapentin  100 mg Oral TID   heparin  5,000 Units Subcutaneous Q8H   insulin aspart  0-6 Units Subcutaneous TID WC   multivitamin  1 tablet Oral QHS   nicotine  14 mg Transdermal Daily   pantoprazole  40 mg Oral BID   QUEtiapine  50 mg Oral QHS   saccharomyces boulardii  250 mg Oral BID   Continuous Infusions:  sodium chloride     sodium chloride      Diet Order             Diet renal/carb modified with fluid restriction Diet-HS Snack? Nothing; Fluid restriction: 1200 mL Fluid; Room service appropriate? Yes; Fluid consistency: Thin  Diet effective now                   Weight change:   Wt Readings from Last 3 Encounters:  04/20/21 128.5 kg  04/09/21 127.2 kg  04/04/21 134.3 kg     Consultants:see note  Procedures:see note   Antimicrobials: Anti-infectives (From admission, onward)    Start     Dose/Rate Route Frequency Ordered Stop   04/19/21 0845  ceFAZolin (ANCEF)  IVPB 3g/100 mL premix        3 g 200 mL/hr over 30 Minutes Intravenous  Once 04/19/21 0840 04/19/21 1027   04/19/21 0841  ceFAZolin (ANCEF) 2-4 GM/100ML-% IVPB       Note to Pharmacy: Cameron Sprang M: cabinet override      04/19/21 0841 04/19/21 2044   04/17/21 1200  vancomycin (VANCOCIN) IVPB 1000 mg/200 mL premix        1,000 mg 200 mL/hr over 60 Minutes Intravenous Every Wed (Hemodialysis) 04/16/21 1339 04/17/21 1443   04/16/21 0200  vancomycin (VANCOCIN) IVPB 1000 mg/200 mL premix  1,000 mg 200 mL/hr over 60 Minutes Intravenous  Once 04/15/21 2103 04/16/21 0625   04/15/21 1220  vancomycin variable dose per unstable renal function (pharmacist dosing)  Status:  Discontinued         Does not apply See admin instructions 04/15/21 1220 04/17/21 0724   04/14/21 1130  vancomycin (VANCOCIN) 2,500 mg in sodium chloride 0.9 % 500 mL IVPB        2,500 mg 250 mL/hr over 120 Minutes Intravenous  Once 04/14/21 1120 04/14/21 2006   04/14/21 1115  cefTRIAXone (ROCEPHIN) 2 g in sodium chloride 0.9 % 100 mL IVPB        2 g 200 mL/hr over 30 Minutes Intravenous Every 24 hours 04/14/21 1109 04/17/21 2359   04/14/21 1115  metroNIDAZOLE (FLAGYL) IVPB 500 mg        500 mg 100 mL/hr over 60 Minutes Intravenous Every 8 hours 04/14/21 1109 04/17/21 2359   04/14/21 0945  piperacillin-tazobactam (ZOSYN) IVPB 2.25 g  Status:  Discontinued        2.25 g 100 mL/hr over 30 Minutes Intravenous Every 8 hours 04/14/21 0940 04/14/21 1109      Culture/Microbiology    Component Value Date/Time   SDES BLOOD RIGHT HAND 04/14/2021 1035   SPECREQUEST  04/14/2021 1035    BOTTLES DRAWN AEROBIC AND ANAEROBIC Blood Culture adequate volume   CULT  04/14/2021 1035    NO GROWTH 5 DAYS Performed at [facility], [place] [address] [place], [place] [address]    REPTSTATUS 04/19/2021 FINAL 04/14/2021 1035    Other culture-see note  Unresulted Labs (From admission, onward)     Start     Ordered   04/20/21  1435  Renal function panel  Once,   R        04/20/21 0636   04/20/21 1435  CBC  Once,   R        04/20/21 0636            Data Reviewed: I have personally reviewed following labs and imaging studies  CBC: Recent Labs  Lab 04/14/21 0804 04/15/21 0508 04/16/21 0435 04/17/21 0334 04/18/21 0345 04/19/21 0236 04/20/21 0236  WBC 6.2   < > 5.1 11.2* 9.1 5.9 6.8  NEUTROABS 3.3  --   --  8.5*  --   --   --   HGB 8.9*   < > 8.9* 6.7* 7.8* 7.3* 7.0*  HCT 28.9*   < > 28.3* 21.2* 24.1* 23.1* 22.7*  MCV 108.6*   < > 105.2* 108.2* 101.3* 103.1* 105.1*  PLT 203   < > 164 155 156 147* 144*   < > = values in this interval not displayed.    Basic Metabolic Panel: Recent Labs  Lab 04/15/21 0508 04/16/21 0435 04/17/21 0334 04/18/21 1206 04/19/21 0236  NA 135 133* 131* 135 136  K 5.1 3.6 6.0* 3.9 4.1  CL 97* 95* 96* 97* 99  CO2 28 28 24 28 27   GLUCOSE 157* 184* 159* 132* 158*  BUN 29* 15 27* 23 32*  CREATININE 10.64* 5.82* 8.43* 7.03* 8.31*  CALCIUM 8.9 8.7* 8.4* 8.5* 8.3*  PHOS  --   --  3.6 2.5 3.1    GFR: Estimated Creatinine Clearance: 12.8 mL/min (A) (by C-G formula based on SCr of 8.31 mg/dL (H)).  Liver Function Tests: Recent Labs  Lab 04/17/21 0334 04/18/21 1206 04/19/21 0236  ALBUMIN 2.2* 2.5* 2.1*     CBG: Recent Labs  Lab 04/19/21  [place] 04/19/21 1327 04/19/21 1645 04/19/21 2024 04/20/21 0559  GLUCAP 93 71 190* 136* 116*     Recent Results (from the past 240 hour(s))  Resp Panel by RT-PCR (Flu A&B, Covid) Nasopharyngeal Swab     Status: None   Collection Time: 04/14/21 10:25 AM   Specimen: Nasopharyngeal Swab; Nasopharyngeal(NP) swabs in vial transport medium  Result Value Ref Range Status   SARS Coronavirus 2 by RT PCR NEGATIVE NEGATIVE Final    Comment: (NOTE) SARS-CoV-2 target nucleic acids are NOT DETECTED.  The SARS-CoV-2 RNA is generally detectable in upper respiratory specimens during the acute phase of infection. The  lowest concentration of SARS-CoV-2 viral copies this assay can detect is 138 copies/mL. A negative result does not preclude SARS-Cov-2 infection and should not be used as the sole basis for treatment or other patient management decisions. A negative result may occur with  improper specimen collection/handling, submission of specimen other than nasopharyngeal swab, presence of viral mutation(s) within the areas targeted by this assay, and inadequate number of viral copies(<138 copies/mL). A negative result must be combined with clinical observations, patient history, and epidemiological information. The expected result is Negative.  Fact Sheet for Patients:  EntrepreneurPulse.com.au  Fact Sheet for Healthcare Providers:  IncredibleEmployment.be  This test is no t yet approved or cleared by the [place] FDA and  has been authorized for detection and/or diagnosis of SARS-CoV-2 by FDA under an Emergency Use Authorization (EUA). This EUA will remain  in effect (meaning this test can be used) for the duration of the COVID-19 declaration under Section 564(b)(1) of the Act, 21 U.S.C.section 360bbb-3(b)(1), unless the authorization is terminated  or revoked sooner.       Influenza A by PCR NEGATIVE NEGATIVE Final   Influenza B by PCR NEGATIVE NEGATIVE Final    Comment: (NOTE) The Xpert Xpress SARS-CoV-2/FLU/RSV plus assay is intended as an aid in the diagnosis of influenza from Nasopharyngeal swab specimens and should not be used as a sole basis for treatment. Nasal washings and aspirates are unacceptable for Xpert Xpress SARS-CoV-2/FLU/RSV testing.  Fact Sheet for Patients: EntrepreneurPulse.com.au  Fact Sheet for Healthcare Providers: IncredibleEmployment.be  This test is not yet approved or cleared by the [place] FDA and has been authorized for detection and/or diagnosis of SARS-CoV-2 by FDA under  an Emergency Use Authorization (EUA). This EUA will remain in effect (meaning this test can be used) for the duration of the COVID-19 declaration under Section 564(b)(1) of the Act, 21 U.S.C. section 360bbb-3(b)(1), unless the authorization is terminated or revoked.  Performed at [facility], [place] [address] [place], [place] [address]   Blood culture (routine x 2)     Status: None   Collection Time: 04/14/21 10:30 AM   Specimen: BLOOD  Result Value Ref Range Status   Specimen Description BLOOD RIGHT ANTECUBITAL  Final   Special Requests   Final    BOTTLES DRAWN AEROBIC AND ANAEROBIC Blood Culture adequate volume   Culture   Final    NO GROWTH 5 DAYS Performed at [facility], [place] [address] [place], [place] [address]    Report Status 04/19/2021 FINAL  Final  Blood culture (routine x 2)     Status: None   Collection Time: 04/14/21 10:35 AM   Specimen: BLOOD RIGHT HAND  Result Value Ref Range Status   Specimen Description BLOOD RIGHT HAND  Final   Special Requests   Final    BOTTLES DRAWN AEROBIC AND ANAEROBIC  Blood Culture adequate volume   Culture   Final    NO GROWTH 5 DAYS Performed at [facility], [place] [address], [place], [place] [address]    Report Status 04/19/2021 FINAL  Final  Surgical pcr screen     Status: Abnormal   Collection Time: 04/15/21  4:48 PM   Specimen: Nasal Mucosa; Nasal Swab  Result Value Ref Range Status   MRSA, PCR NEGATIVE NEGATIVE Final   Staphylococcus aureus POSITIVE (A) NEGATIVE Final    Comment: (NOTE) The Xpert SA Assay (FDA approved for NASAL specimens in patients 47 years of age and older), is one component of a comprehensive surveillance program. It is not intended to diagnose infection nor to guide or monitor treatment. Performed at [facility], [place] [address] [place], [place] [address]       Radiology Studies: HYBRID OR IMAGING ([place])  Result Date: 04/19/2021 There is no interpretation for this  exam.  This order is for images obtained during a surgical procedure.  Please See "Surgeries" Tab for more information regarding the procedure.     LOS: 6 days   Bonnielee Haff, MD [facility]  04/20/2021, 10:12 AM

## 2021-04-20 NOTE — Progress Notes (Signed)
[facility] [place] PROGRESS NOTE  ASSESSMENT / PLAN: Daniel [name] is a 60 y.o. male status post left below-knee amputation 04/16/21; status post left upper extremity brachiocephalic arteriovenous fistula revision and placement of tunneled dialysis catheter for ulceration of fistula 04/19/2021.  Overall doing well.  Below-knee amputation appears to be healing.  Left upper extremity incision healing appropriately.  Dialysis working well through the tunneled line.  Please call for any questions over the weekend.  We will plan to see the patient again on Tuesday.  SUBJECTIVE: No complaints.  Tolerating HD.  Leg is sore.    OBJECTIVE: BP 135/86    Pulse 96    Temp 99 F (37.2 C) (Oral)    Resp 19    Ht 5\' 11"  (1.803 m)    Wt 128.5 kg    SpO2 99%    BMI 39.51 kg/m   Intake/Output Summary (Last 24 hours) at 04/20/2021 1145 Last data filed at 04/20/2021 1023 Gross per 24 hour  Intake 675 ml  Output --  Net 675 ml    No acute distress Regular rate and rhythm Unlabored breathing TDC in right chest working well on dialysis Left upper extremity brachiocephalic arteriovenous fistula with good thrill.  Incision is clean dry and intact. Left below-knee amputation healing well with staples in place.  Mild area of bruising about the mid flap.  CBC Latest Ref Rng & Units 04/20/2021 04/19/2021 04/18/2021  WBC 4.0 - 10.5 K/uL 6.8 5.9 9.1  Hemoglobin 13.0 - 17.0 g/dL 7.0(L) 7.3(L) 7.8(L)  Hematocrit 39.0 - 52.0 % 22.7(L) 23.1(L) 24.1(L)  Platelets 150 - 400 K/uL 144(L) 147(L) 156     CMP Latest Ref Rng & Units 04/19/2021 04/18/2021 04/17/2021  Glucose 70 - 99 mg/dL 158(H) 132(H) 159(H)  BUN 8 - 23 mg/dL 32(H) 23 27(H)  Creatinine 0.61 - 1.24 mg/dL 8.31(H) 7.03(H) 8.43(H)  Sodium 135 - 145 mmol/L 136 135 131(L)  Potassium 3.5 - 5.1 mmol/L 4.1 3.9 6.0(H)  Chloride 98 - 111 mmol/L 99 97(L) 96(L)  CO2 22 - 32 mmol/L 27 28 24   Calcium 8.9 - 10.3 mg/dL 8.3(L) 8.5(L)  8.4(L)  Total Protein 6.5 - 8.1 g/dL - - -  Total Bilirubin 0.3 - 1.2 mg/dL - - -  Alkaline Phos 38 - 126 U/L - - -  AST 15 - 41 U/L - - -  ALT 0 - 44 U/L - - -    Estimated Creatinine Clearance: 12.8 mL/min (A) (by C-G formula based on SCr of 8.31 mg/dL (H)).  Yevonne Aline. Stanford Breed, MD [facility] Phone Number: [phone] 04/20/2021 11:45 AM

## 2021-04-20 NOTE — Progress Notes (Signed)
Physical Therapy Treatment Patient Details Name: Daniel [name] MRN: [id number] DOB: Nov 14, 1960 Today's Date: 04/20/2021   History of Present Illness Daniel [name], 60 y.o. male admitted 12/18 presenting with foot infection.  He was last admitted from 12/12-13 for PAD and had aortogram with cannulation of the R common femoral artery.  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   PMH of ESRD on TTS HD; DM; HTN; ICH in 2019; and  B/L LE PAD    PT Comments    Patient eager to work with therapy as he is eager to get well enough to go home. Patient fatigued after ambulating 20 ft with difficulty making it the remaining 5 ft back to his bed. He was surprised at how difficult it was for him to ambulate only 25 ft. Agrees he needs more therapy prior to returning home.    Recommendations for follow up therapy are one component of a multi-disciplinary discharge planning process, led by the attending physician.  Recommendations may be updated based on patient status, additional functional criteria and insurance authorization.  Follow Up Recommendations  Skilled nursing-short term rehab (<3 hours/day) (pt turned down for AIR)     Assistance Recommended at Discharge Frequent or constant Supervision/Assistance  Equipment Recommendations  Wheelchair (measurements PT);Wheelchair cushion (measurements PT);Rolling walker (2 wheels);BSC/3in1    Recommendations for Other Services       Precautions / Restrictions Precautions Precautions: Fall Restrictions Weight Bearing Restrictions: Yes RLE Weight Bearing: Weight bearing as tolerated LLE Weight Bearing: Non weight bearing     Mobility  Bed Mobility Overal bed mobility: Modified Independent             General bed mobility comments: Pt able to perform all bed mobility aspects without assistance.    Transfers Overall transfer level: Needs assistance Equipment used: Rolling walker (2 wheels) Transfers: Sit to/from Stand;Bed to  chair/wheelchair/BSC Sit to Stand: Min assist     Step pivot transfers: Min guard     General transfer comment: Patient sitting on EOB with NT present about to transfer to [facility]. PT took over session. Pt required max cues for hand placement with visual demonstration for sit to stand to RW. Ultimately pt able to do with one hand up on RW and one pushing off bed. Same coming off [facility]    Ambulation/Gait Ambulation/Gait assistance: Min assist Gait Distance (Feet): 25 Feet Assistive device: Rolling walker (2 wheels) Gait Pattern/deviations: Step-to pattern Gait velocity: Decreased Gait velocity interpretation: <1.31 ft/sec, indicative of household ambulator   General Gait Details: became fatigued after 20 ft and required assist with turning/moving RW the last 5 ft as approached bed; recommend chair follow next attempt at ambulation   Stairs             Wheelchair Mobility    Modified Rankin (Stroke Patients Only)       Balance Overall balance assessment: Modified Independent Sitting-balance support: No upper extremity supported;Feet supported Sitting balance-Leahy Scale: Good     Standing balance support: Bilateral upper extremity supported;During functional activity Standing balance-Leahy Scale: Poor Standing balance comment: relies on UE suport on RW and external support with dynamic activity.                            Cognition Arousal/Alertness: Awake/alert Behavior During Therapy: WFL for tasks assessed/performed Overall Cognitive Status: Within Functional Limits for tasks assessed  Exercises      General Comments General comments (skin integrity, edema, etc.): no recliner in pt's room, therefore returned to bed after using BSC and walking      Pertinent Vitals/Pain Pain Assessment: 0-10 Pain Score: 8  Breathing: normal Pain Location: residual limb Pain Descriptors / Indicators:  Sore Pain Intervention(s): Limited activity within patient's tolerance;Monitored during session    Home Living                          Prior Function            PT Goals (current goals can now be found in the care plan section) Acute Rehab PT Goals Patient Stated Goal: to get better PT Goal Formulation: With patient Time For Goal Achievement: 05/02/21 Potential to Achieve Goals: Good Progress towards PT goals: Progressing toward goals    Frequency    Min 3X/week      PT Plan Discharge plan needs to be updated;Frequency needs to be updated    Co-evaluation              AM-PAC PT "6 Clicks" Mobility   Outcome Measure  Help needed turning from your back to your side while in a flat bed without using bedrails?: None Help needed moving from lying on your back to sitting on the side of a flat bed without using bedrails?: None Help needed moving to and from a bed to a chair (including a wheelchair)?: A Little Help needed standing up from a chair using your arms (e.g., wheelchair or bedside chair)?: A Little Help needed to walk in hospital room?: Total Help needed climbing 3-5 steps with a railing? : Total 6 Click Score: 16    End of Session Equipment Utilized During Treatment: Gait belt Activity Tolerance: Patient tolerated treatment well Patient left: with call bell/phone within reach;in bed;with bed alarm set;with nursing/sitter in room Nurse Communication: Mobility status PT Visit Diagnosis: Unsteadiness on feet (R26.81);Other abnormalities of gait and mobility (R26.89);Muscle weakness (generalized) (M62.81) Pain - Right/Left:  (bil) Pain - part of body: Leg     Time: 1353-1430 PT Time Calculation (min) (ACUTE ONLY): 37 min  Charges:  $Gait Training: 23-37 mins                      Daniel [name], Daniel [name]  Pager [phone] Office [phone]    Daniel [name] 04/20/2021, 3:01 PM

## 2021-04-20 NOTE — Progress Notes (Signed)
Dressing changed to left bka- scant old drainage

## 2021-04-20 NOTE — Progress Notes (Signed)
[facility] Progress Note   Subjective: Seen on HD. No C/Os. UF as tolerated    Objective Vitals:   04/20/21 0805 04/20/21 0822 04/20/21 0830 04/20/21 0900  BP: 135/69 127/78 135/75 103/78  Pulse: 94 91 91 99  Resp: 15   11  Temp: 98 F (36.7 C)     TempSrc: Oral     SpO2: 97%     Weight: 128.5 kg     Height:       Physical Exam General: Chronically ill appearing male looks older than stated age in NAD Heart: S1,S2 no R/G Lungs: CTAB Abdomen: NABS, NT Extremities: L BKA No RLE edema Dialysis Access: L AVF +T/B, surgical glue intact. RIJ TDC drsg intact, blood lines connected.    Additional Objective Labs: Basic Metabolic Panel: Recent Labs  Lab 04/17/21 0334 04/18/21 1206 04/19/21 0236  NA 131* 135 136  K 6.0* 3.9 4.1  CL 96* 97* 99  CO2 24 28 27   GLUCOSE 159* 132* 158*  BUN 27* 23 32*  CREATININE 8.43* 7.03* 8.31*  CALCIUM 8.4* 8.5* 8.3*  PHOS 3.6 2.5 3.1   Liver Function Tests: Recent Labs  Lab 04/17/21 0334 04/18/21 1206 04/19/21 0236  ALBUMIN 2.2* 2.5* 2.1*   No results for input(s): LIPASE, AMYLASE in the last 168 hours. CBC: Recent Labs  Lab 04/14/21 0804 04/15/21 0508 04/16/21 0435 04/17/21 0334 04/18/21 0345 04/19/21 0236 04/20/21 0236  WBC 6.2   < > 5.1 11.2* 9.1 5.9 6.8  NEUTROABS 3.3  --   --  8.5*  --   --   --   HGB 8.9*   < > 8.9* 6.7* 7.8* 7.3* 7.0*  HCT 28.9*   < > 28.3* 21.2* 24.1* 23.1* 22.7*  MCV 108.6*   < > 105.2* 108.2* 101.3* 103.1* 105.1*  PLT 203   < > 164 155 156 147* 144*   < > = values in this interval not displayed.   Blood Culture    Component Value Date/Time   SDES BLOOD RIGHT HAND 04/14/2021 1035   SPECREQUEST  04/14/2021 1035    BOTTLES DRAWN AEROBIC AND ANAEROBIC Blood Culture adequate volume   CULT  04/14/2021 1035    NO GROWTH 5 DAYS Performed at [facility], [place] [address] [place], [place] [address]    REPTSTATUS 04/19/2021 FINAL 04/14/2021 1035    Cardiac Enzymes: No  results for input(s): CKTOTAL, CKMB, CKMBINDEX, TROPONINI in the last 168 hours. CBG: Recent Labs  Lab 04/19/21 1233 04/19/21 1327 04/19/21 1645 04/19/21 2024 04/20/21 0559  GLUCAP 93 71 190* 136* 116*   Iron Studies: No results for input(s): IRON, TIBC, TRANSFERRIN, FERRITIN in the last 72 hours. @lablastinr3 @ Studies/Results: HYBRID OR IMAGING ([facility] ONLY)  Result Date: 04/19/2021 There is no interpretation for this exam.  This order is for images obtained during a surgical procedure.  Please See "Surgeries" Tab for more information regarding the procedure.   Medications:  sodium chloride     sodium chloride      (feeding supplement) PROSource Plus  30 mL Oral BID BM   sodium chloride   Intravenous Once   acetaminophen  650 mg Oral Once   amLODipine  10 mg Oral QHS   calcium acetate  1,334 mg Oral TID WC   Chlorhexidine Gluconate Cloth  6 each Topical Q0600   cloNIDine  0.1 mg Oral Once per day on Tue Thu Sat   cloNIDine  0.1 mg Oral 2 times per day on  Sun Mon Wed Fri   diphenhydrAMINE  25 mg Intravenous Once   feeding supplement (NEPRO CARB STEADY)  237 mL Oral BID BM   gabapentin  100 mg Oral TID   heparin  5,000 Units Subcutaneous Q8H   insulin aspart  0-6 Units Subcutaneous TID WC   multivitamin  1 tablet Oral QHS   nicotine  14 mg Transdermal Daily   pantoprazole  40 mg Oral BID   QUEtiapine  50 mg Oral QHS   saccharomyces boulardii  250 mg Oral BID    Dialysis Orders: TTS - [place] 4hrs, BFR 450, DFR 500,  [id number], 2K/ 2Ca Mircera 225 mcg IV q2wks - last 04/06/21 No Systemic Heparin Hectorol 82mcg IV with HD-04/13/21 Venofer 100mg  IV X 6 doses-last 04/13/21   Assessment/Plan: Diabetic L foot infection/PVD-Underwent LE angiography 04/08/21 showed limb ischemia L leg. 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 Scot Dock. Continue ABX. Aneurysmal AVF: Went for plication of AVF and placement of TDC per Dr. Scot Dock this AM.  ESRD - T,Th,S -HD off schedule earlier  this week. Next HD 04/20/2021. Labs pending.   Hypertension/volume  - Patient euvolemic on exam. Bps stable. Continue current anti-hypertensive regimen, UF as tolerated. Will need lower EDW post BKA on discharge.  Anemia of CKD - S/P 2 units PRBCs 04/17/21. HGB still 7.0 today. Will transfuse 2 units PRBCs today with HD.  Secondary Hyperparathyroidism - Ca ok, PO4 at goal.   Nutrition - renal diet with fluid restrictions. Add protein supps  Kermit Arnette H. Marilee Ditommaso NP-C 04/20/2021, 9:35 AM  [employer] [phone]

## 2021-04-21 DIAGNOSIS — R259 Unspecified abnormal involuntary movements: Secondary | ICD-10-CM

## 2021-04-21 DIAGNOSIS — I10 Essential (primary) hypertension: Secondary | ICD-10-CM | POA: Diagnosis not present

## 2021-04-21 DIAGNOSIS — R197 Diarrhea, unspecified: Secondary | ICD-10-CM | POA: Diagnosis not present

## 2021-04-21 LAB — TYPE AND SCREEN
ABO/RH(D): B POS
Antibody Screen: NEGATIVE
Unit division: 0
Unit division: 0
Unit division: 0
Unit division: 0

## 2021-04-21 LAB — BPAM RBC
Blood Product Expiration Date: 202301142359
Blood Product Expiration Date: 202301142359
Blood Product Expiration Date: 202301162359
Blood Product Expiration Date: 202301162359
ISSUE DATE / TIME: 202212211014
ISSUE DATE / TIME: 202212211014
ISSUE DATE / TIME: 202212240920
ISSUE DATE / TIME: 202212240920
Unit Type and Rh: 7300
Unit Type and Rh: 7300
Unit Type and Rh: 7300
Unit Type and Rh: 7300

## 2021-04-21 LAB — CBC
HCT: 28.2 % — ABNORMAL LOW (ref 39.0–52.0)
Hemoglobin: 8.7 g/dL — ABNORMAL LOW (ref 13.0–17.0)
MCH: 31.2 pg (ref 26.0–34.0)
MCHC: 30.9 g/dL (ref 30.0–36.0)
MCV: 101.1 fL — ABNORMAL HIGH (ref 80.0–100.0)
Platelets: 136 10*3/uL — ABNORMAL LOW (ref 150–400)
RBC: 2.79 MIL/uL — ABNORMAL LOW (ref 4.22–5.81)
RDW: 25.3 % — ABNORMAL HIGH (ref 11.5–15.5)
WBC: 7.4 10*3/uL (ref 4.0–10.5)
nRBC: 2.8 % — ABNORMAL HIGH (ref 0.0–0.2)

## 2021-04-21 LAB — GLUCOSE, CAPILLARY
Glucose-Capillary: 100 mg/dL — ABNORMAL HIGH (ref 70–99)
Glucose-Capillary: 156 mg/dL — ABNORMAL HIGH (ref 70–99)
Glucose-Capillary: 126 mg/dL — ABNORMAL HIGH (ref 70–99)
Glucose-Capillary: 166 mg/dL — ABNORMAL HIGH (ref 70–99)

## 2021-04-21 MED ORDER — LOPERAMIDE HCL 2 MG PO CAPS
4.0000 mg | ORAL_CAPSULE | Freq: Three times a day (TID) | ORAL | Status: AC
  Administered 2021-04-21 – 2021-04-22 (×3): 4 mg via ORAL
  Filled 2021-04-21 (×3): qty 2

## 2021-04-21 NOTE — Progress Notes (Signed)
Daniel [name] Progress Note   Subjective: Seen and examined bedside this am. No acute events. He reports that he has not had a BM in 4 days  Objective Vitals:   04/20/21 1130 04/20/21 1252 04/20/21 1958 04/21/21 0815  BP: (!) 144/86 (!) 126/91 (!) 134/54 134/73  Pulse:  98 97 89  Resp: (!) 24 18 18 17   Temp: 99.5 F (37.5 C) 99 F (37.2 C)  97.8 F (36.6 C)  TempSrc: Oral Oral  Oral  SpO2: 100% 100% 98% 95%  Weight: 126 kg     Height:       Physical Exam General: Chronically ill appearing male looks older than stated age in NAD Heart: S1,S2 no R/G Lungs: CTAB Abdomen: NABS, NT Extremities: L BKA No RLE edema Dialysis Access: L AVF +T/B, surgical glue intact. RIJ TDC drsg intact, blood lines connected.    Additional Objective Labs: Basic Metabolic Panel: Recent Labs  Lab 04/17/21 0334 04/18/21 1206 04/19/21 0236  NA 131* 135 136  K 6.0* 3.9 4.1  CL 96* 97* 99  CO2 24 28 27   GLUCOSE 159* 132* 158*  BUN 27* 23 32*  CREATININE 8.43* 7.03* 8.31*  CALCIUM 8.4* 8.5* 8.3*  PHOS 3.6 2.5 3.1   Liver Function Tests: Recent Labs  Lab 04/17/21 0334 04/18/21 1206 04/19/21 0236  ALBUMIN 2.2* 2.5* 2.1*   No results for input(s): LIPASE, AMYLASE in the last 168 hours. CBC: Recent Labs  Lab 04/17/21 0334 04/18/21 0345 04/19/21 0236 04/20/21 0236 04/21/21 0500  WBC 11.2* 9.1 5.9 6.8 7.4  NEUTROABS 8.5*  --   --   --   --   HGB 6.7* 7.8* 7.3* 7.0* 8.7*  HCT 21.2* 24.1* 23.1* 22.7* 28.2*  MCV 108.2* 101.3* 103.1* 105.1* 101.1*  PLT 155 156 147* 144* 136*   Blood Culture    Component Value Date/Time   SDES BLOOD RIGHT HAND 04/14/2021 1035   SPECREQUEST  04/14/2021 1035    BOTTLES DRAWN AEROBIC AND ANAEROBIC Blood Culture adequate volume   CULT  04/14/2021 1035    NO GROWTH 5 DAYS Performed at [facility], [place] [address], [place], [place] [address]    REPTSTATUS 04/19/2021 FINAL 04/14/2021 1035    Cardiac Enzymes: No results for  input(s): CKTOTAL, CKMB, CKMBINDEX, TROPONINI in the last 168 hours. CBG: Recent Labs  Lab 04/20/21 0559 04/20/21 1244 04/20/21 1655 04/20/21 2001 04/21/21 0811  GLUCAP 116* 150* 122* 116* 100*   Iron Studies: No results for input(s): IRON, TIBC, TRANSFERRIN, FERRITIN in the last 72 hours. @lablastinr3 @ Studies/Results: No results found. Medications:    (feeding supplement) PROSource Plus  30 mL Oral BID BM   sodium chloride   Intravenous Once   acetaminophen  650 mg Oral Once   amLODipine  10 mg Oral QHS   calcium acetate  1,334 mg Oral TID WC   Chlorhexidine Gluconate Cloth  6 each Topical Q0600   cloNIDine  0.1 mg Oral Once per day on Tue Thu Sat   cloNIDine  0.1 mg Oral 2 times per day on Sun Mon Wed Fri   diphenhydrAMINE  25 mg Intravenous Once   feeding supplement (NEPRO CARB STEADY)  237 mL Oral BID BM   gabapentin  100 mg Oral TID   heparin  5,000 Units Subcutaneous Q8H   insulin aspart  0-6 Units Subcutaneous TID WC   multivitamin  1 tablet Oral QHS   nicotine  14 mg Transdermal Daily   pantoprazole  40 mg Oral BID   saccharomyces boulardii  250 mg Oral BID    Dialysis Orders: TTS - [place] 4hrs, BFR 450, DFR 500,  [id number], 2K/ 2Ca Mircera 225 mcg IV q2wks - last 04/06/21 No Systemic Heparin Hectorol 54mcg IV with HD-04/13/21 Venofer 100mg  IV X 6 doses-last 04/13/21   Assessment/Plan: Diabetic L foot infection/PVD-Underwent LE angiography 04/08/21 showed limb ischemia L leg. 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 Scot Dock. Currently off abx now. Per primary service Aneurysmal AVF: Went for plication of AVF and placement of TDC on 12/23 Constipation: per primary service ESRD - T,Th,S -HD off schedule earlier this week. Next HD 04/23/2021.  Hypertension/volume  - Patient euvolemic on exam. Bps stable. Continue current anti-hypertensive regimen, UF as tolerated. Will need lower EDW post BKA on discharge. post hd edw 12/24 126kg-challenge further Anemia of  CKD - S/P 2 units PRBCs 04/17/21. S/p transfusion w/ hd yesterday, hgb up to 8.7 today Secondary Hyperparathyroidism - check po4.   Nutrition - renal diet with fluid restrictions. Add protein supps  Gean Quint, MD [employer]

## 2021-04-21 NOTE — TOC Initial Note (Signed)
Transition of Care [facility]) - Initial/Assessment Note    Patient Details  Name: Daniel [name] MRN: [id number] Date of Birth: 07/29/1960  Transition of [facility]) CM/SW Contact:    Coralee Pesa, [place] Phone Number: 04/21/2021, 12:25 PM  Clinical Narrative:                 CSW spoke with pt at bedside, he was initially drowsy, but agreed to speak. He was advised of [facility] not being able to accept and SNF being the recommendation. Pt is agreeable and states he wants to be as close to where he lives as possible. Plan is for pt to return to his boarding house. He asked if CSW could help secure him a more permanent housing situation. At this time CSW does not have any permanent housing options. [facility] is on FL2 but Pt is not able to be located in [place], will need to follow up when they reopen. CSW will fax out and TOC will continue to follow for DC needs.  Expected Discharge Plan: Skilled Nursing Facility Barriers to Discharge: SNF Pending bed offer, Continued Medical Work up   Patient Goals and CMS Choice Patient states their goals for this hospitalization and ongoing recovery are:: Pt wants to get better and find long term living arrangements. CMS Medicare.gov Compare Post Acute Care list provided to:: Patient Choice offered to / list presented to : Patient  Expected Discharge Plan and Services Expected Discharge Plan: [place] Choice: [place] Living arrangements for the past 2 months: [place]                                      Prior Living Arrangements/Services Living arrangements for the past 2 months: [place] with:: Self Patient language and need for interpreter reviewed:: Yes Do you feel safe going back to the place where you live?: Yes      Need for Family Participation in Patient Care: Yes (Comment) Care giver support system in place?: No (comment)   Criminal Activity/Legal Involvement  Pertinent to Current Situation/Hospitalization: No - Comment as needed  Activities of Daily Living Home Assistive Devices/Equipment: None ADL Screening (condition at time of admission) Patient's cognitive ability adequate to safely complete daily activities?: Yes Is the patient deaf or have difficulty hearing?: No Does the patient have difficulty seeing, even when wearing glasses/contacts?: No Does the patient have difficulty concentrating, remembering, or making decisions?: No Patient able to express need for assistance with ADLs?: Yes Does the patient have difficulty dressing or bathing?: No Independently performs ADLs?: Yes (appropriate for developmental age) Does the patient have difficulty walking or climbing stairs?: Yes Weakness of Legs: Both Weakness of Arms/Hands: None  Permission Sought/Granted Permission sought to share information with : Family Supports Permission granted to share information with : No              Emotional Assessment Appearance:: Appears stated age Attitude/Demeanor/Rapport: Sedated Affect (typically observed): Appropriate Orientation: : Oriented to Self, Oriented to Place, Oriented to  Time, Oriented to Situation Alcohol / Substance Use: Not Applicable Psych Involvement: No (comment)  Admission diagnosis:  PVD (peripheral vascular disease) ([place]) [I73.9] Foot infection [L08.9] Diabetic foot infection ([place]) [I62.703, L08.9] Patient Active Problem List   Diagnosis Date Noted   Diabetic foot infection ([place]) 04/14/2021   PAD (peripheral artery disease) ([place]) 04/08/2021  Gastritis and gastroduodenitis    End-stage renal disease on hemodialysis ([place]) 03/25/2021   Diabetes mellitus type 2, controlled, with complications ([place]) 87/56/4332   QT prolongation 03/25/2021   Alcohol abuse with intoxication ([place]) 03/25/2021   Cocaine abuse ([place]) 03/25/2021   Nicotine abuse 03/25/2021   Class 2 obesity due to excess calories with body mass index (BMI) of  39.0 to 39.9 in adult 03/25/2021   AVM (arteriovenous malformation) of duodenum, acquired    Peptic ulcer disease    Upper GI bleed 03/24/2021   Chalazion of right upper eyelid 03/24/2021   Thrombocytopenia ([place]) 03/24/2021   Visual hallucination 03/24/2021   Bilateral leg edema 03/24/2021   Acute blood loss anemia    Benign neoplasm of duodenum, jejunum, and ileum    Anemia due to chronic kidney disease 02/23/2021   Prolonged QT interval 02/23/2021   Rotator cuff tear arthropathy of right shoulder 01/23/2021   Alcohol dependence ([place]) 01/13/2021   Gastric ulcer with hemorrhage 01/13/2021   Generalized weakness    Transaminitis 01/10/2021   History of alcohol abuse 10/27/2020   History of cocaine abuse ([place]) 10/27/2020   Nicotine dependence, cigarettes, uncomplicated 95/18/8416   Acute GI bleeding 08/10/2020   Gastrointestinal hemorrhage with melena    Duodenal ulcer with hemorrhage    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   ESRD on hemodialysis ([place])    AVM (arteriovenous malformation) of small bowel, acquired with hemorrhage    Symptomatic anemia 07/31/2019   Acute on chronic anemia 07/23/2019   Chronic hepatitis C without hepatic coma ([place]) 07/11/2019   Iron deficiency anemia 03/30/2019   Alcohol use disorder 03/30/2019   B12 deficiency 03/30/2019   Orthostatic hypotension 01/22/2019   Essential hypertension 06/29/2016   DM2 (diabetes mellitus, type 2) ([place]) 06/29/2016   PCP:  [name], NP Pharmacy:   [facility] [place] [address] Phone: [phone] Fax: [phone]  [facility] [address] [place] [address] Phone: [phone] Fax: [phone]     Social Determinants of Health (SDOH) Interventions    Readmission Risk Interventions Readmission Risk Prevention Plan 02/27/2021 07/07/2019  Transportation  Screening Complete Complete  PCP or Specialist Appt within 3-5 Days - Complete  HRI or [place] - Complete  Social Work Consult for [place] Planning/Counseling - Complete  Palliative Care Screening - Not Applicable  Medication Review Press photographer) Complete Complete  PCP or Specialist appointment within 3-5 days of discharge Complete -  [place] or Home Care Consult Complete -  SW Recovery Care/Counseling Consult Complete -  Palliative Care Screening Not Applicable -  [place] Not Applicable -  Some recent data might be hidden

## 2021-04-21 NOTE — Plan of Care (Signed)
  Problem: Education: Goal: Knowledge of General Education information will improve Description: Including pain rating scale, medication(s)/side effects and non-pharmacologic comfort measures Outcome: Progressing   Problem: Clinical Measurements: Goal: Will remain free from infection Outcome: Progressing Goal: Diagnostic test results will improve Outcome: Progressing Goal: Respiratory complications will improve Outcome: Progressing   

## 2021-04-21 NOTE — Progress Notes (Signed)
PROGRESS NOTE    Daniel [name]  BDZ:[id number] DOB: 1961-03-21 DOA: 04/14/2021 PCP: [name], NP    Brief Narrative/Hospital Course: Daniel [name], 60 y.o. male with PMH of ESRD on TTS HD; DM; HTN; ICH in 2019; and  B/L LE PAD presenting with foot infection.  He was last admitted from 12/12-13 for PAD and had aortogram with cannulation of the R common femoral artery, he does not have revascularization options available and so was to f/u with Dr. Scot Dock in 1-2 weeks to discuss TMA vs. BKA now presents with worsening pain and swelling in his foot. In the ED afebrile vitals stable labs with creatinine 8.6, CRP 1.7 lactic acid 1.6, normal WBC count anemia 8.9 g, xray- are fractures through the bases of the third, fourth, and fifth proximal phalanges, unchanged since March 30, 2021 \\We  were consulted, placed on vancomycin,Flagyl and ceftriaxone and admitted for further management.   Subjective: Patient mentions that he started having diarrhea again overnight.  Denies any abdominal pain.  Reports good appetite.  No fevers reported.  Pain in the left stump area remains 7 out of 10 in intensity.  No bleeding noted.     Assessment & Plan:  Diabetic foot infection of the left foot Bilateral lower extremity PVD Patient was seen by vascular surgery.  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   Patient was on vancomycin ceftriaxone and metronidazole.  These have been discontinued.   Continue pain medications.  Neurosurgery continues to follow.  Essential hypertension Remains on amlodipine and clonidine.  Blood pressure is reasonably well controlled.  Diarrhea Initially thought to be due to antibiotics but he has been off of them for 2 days now.  Did not have any significant diarrhea yesterday but appeared to have had some loose stools overnight.  Continue probiotics.  Continue Imodium.  Will change to scheduled for 24 hours and then change to as needed.  Abdomen remains  benign.  WBC has been normal.  ESRD on HD TTS/Metabolic bone disease Being dialyzed off schedule.  Nephrology is following. 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    Anemia of renal disease Patient was transfused 2 units of PRBC with hemodialysis on 12/21.  Hemoglobin has been drifting down.  Down to 7.0.  No overt bleeding noted.  Has not had any melanotic stools or blood in the stool.  No bleeding noted from the stump area.  This could all be reflective of his kidney disease.   Transfused additional 2 units of PRBC with hemodialysis on 12/24.   Hemoglobin noted to be 8.7 today.  We will recheck tomorrow.  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   Just on SSI.  CBGs are reasonably well controlled.  Involuntary movement of upper extremities He reported occasional jerking movements of his upper extremities.  Noted to be on Neurontin which can cause tremors.  He is noted to be on Seroquel which can cause tardive dyskinesia and dystonia.  Seroquel was discontinued.  We will reassess his symptoms tomorrow.    Obesity Estimated body mass index is 38.74 kg/m as calculated from the following:   Height as of this encounter: 5\' 11"  (1.803 m).   Weight as of this encounter: 126 kg.   DVT prophylaxis: heparin injection 5,000 Units Start: 04/14/21 1400 Code Status:   Code Status: Full Code Family Communication: Discussed with patient Disposition:  Skilled nursing facility is being pursued   Status is: Inpatient  Remains inpatient appropriate because: Status post left BKA.  Anemia.  End-stage renal disease.   Objective: Vitals last 24 hrs: Vitals:   04/20/21 1130 04/20/21 1252 04/20/21 1958 04/21/21 0815  BP: (!) 144/86 (!) 126/91 (!) 134/54 134/73  Pulse:  98 97 89  Resp: (!) 24 18 18 17   Temp: 99.5 F (37.5 C) 99 F (37.2 C)  97.8 F (36.6  C)  TempSrc: Oral Oral  Oral  SpO2: 100% 100% 98% 95%  Weight: 126 kg     Height:       Weight change:   Intake/Output Summary (Last 24 hours) at 04/21/2021 1029 Last data filed at 04/20/2021 1122 Gross per 24 hour  Intake 315 ml  Output 2500 ml  Net -2185 ml    Net IO Since Admission: -4,212.29 mL [04/21/21 1029]    Physical Examination:  General appearance: Awake alert.  In no distress Resp: Clear to auscultation bilaterally.  Normal effort Cardio: S1-S2 is normal regular.  No S3-S4.  No rubs murmurs or bruit GI: Abdomen is soft.  Nontender nondistended.  Bowel sounds are present normal.  No masses organomegaly Extremities: Left lower extremity stump is covered in dressing.  No bleeding noted Neurologic: Alert and oriented x3.  No focal neurological deficits.       Medications reviewed:  Scheduled Meds:  (feeding supplement) PROSource Plus  30 mL Oral BID BM   sodium chloride   Intravenous Once   acetaminophen  650 mg Oral Once   amLODipine  10 mg Oral QHS   calcium acetate  1,334 mg Oral TID WC   Chlorhexidine Gluconate Cloth  6 each Topical Q0600   cloNIDine  0.1 mg Oral Once per day on Tue Thu Sat   cloNIDine  0.1 mg Oral 2 times per day on Sun Mon Wed Fri   diphenhydrAMINE  25 mg Intravenous Once   feeding supplement (NEPRO CARB STEADY)  237 mL Oral BID BM   gabapentin  100 mg Oral TID   heparin  5,000 Units Subcutaneous Q8H   insulin aspart  0-6 Units Subcutaneous TID WC   multivitamin  1 tablet Oral QHS   nicotine  14 mg Transdermal Daily   pantoprazole  40 mg Oral BID   saccharomyces boulardii  250 mg Oral BID   Continuous Infusions:    Diet Order             Diet renal/carb modified with fluid restriction Diet-HS Snack? Nothing; Fluid restriction: 1200 mL Fluid; Room service appropriate? Yes; Fluid consistency: Thin  Diet effective now                   Weight change:   Wt Readings from Last 3 Encounters:  04/20/21 126 kg  04/09/21  127.2 kg  04/04/21 134.3 kg     Consultants:see note  Procedures:see note   Antimicrobials: Anti-infectives (From admission, onward)    Start     Dose/Rate Route Frequency Ordered Stop   04/19/21 0845  ceFAZolin (ANCEF) IVPB 3g/100 mL premix        3 g 200 mL/hr over 30 Minutes Intravenous  Once 04/19/21 0840 04/19/21 1027   04/19/21 0841  ceFAZolin (ANCEF) 2-4 GM/100ML-% IVPB       Note to Pharmacy: Cameron Sprang M: cabinet override      04/19/21 0841 04/19/21 2044   04/17/21 1200  vancomycin (VANCOCIN) IVPB 1000 mg/200 mL premix  1,000 mg 200 mL/hr over 60 Minutes Intravenous Every Wed (Hemodialysis) 04/16/21 1339 04/17/21 1443   04/16/21 0200  vancomycin (VANCOCIN) IVPB 1000 mg/200 mL premix        1,000 mg 200 mL/hr over 60 Minutes Intravenous  Once 04/15/21 2103 04/16/21 0625   04/15/21 1220  vancomycin variable dose per unstable renal function (pharmacist dosing)  Status:  Discontinued         Does not apply See admin instructions 04/15/21 1220 04/17/21 0724   04/14/21 1130  vancomycin (VANCOCIN) 2,500 mg in sodium chloride 0.9 % 500 mL IVPB        2,500 mg 250 mL/hr over 120 Minutes Intravenous  Once 04/14/21 1120 04/14/21 2006   04/14/21 1115  cefTRIAXone (ROCEPHIN) 2 g in sodium chloride 0.9 % 100 mL IVPB        2 g 200 mL/hr over 30 Minutes Intravenous Every 24 hours 04/14/21 1109 04/17/21 2359   04/14/21 1115  metroNIDAZOLE (FLAGYL) IVPB 500 mg        500 mg 100 mL/hr over 60 Minutes Intravenous Every 8 hours 04/14/21 1109 04/17/21 2359   04/14/21 0945  piperacillin-tazobactam (ZOSYN) IVPB 2.25 g  Status:  Discontinued        2.25 g 100 mL/hr over 30 Minutes Intravenous Every 8 hours 04/14/21 0940 04/14/21 1109      Culture/Microbiology    Component Value Date/Time   SDES BLOOD RIGHT HAND 04/14/2021 1035   SPECREQUEST  04/14/2021 1035    BOTTLES DRAWN AEROBIC AND ANAEROBIC Blood Culture adequate volume   CULT  04/14/2021 1035    NO GROWTH 5  DAYS Performed at [facility], [place] [address] [place], [place] [address]    REPTSTATUS 04/19/2021 FINAL 04/14/2021 1035    Other culture-see note  Unresulted Labs (From admission, onward)    None       Data Reviewed: I have personally reviewed following labs and imaging studies  CBC: Recent Labs  Lab 04/17/21 0334 04/18/21 0345 04/19/21 0236 04/20/21 0236 04/21/21 0500  WBC 11.2* 9.1 5.9 6.8 7.4  NEUTROABS 8.5*  --   --   --   --   HGB 6.7* 7.8* 7.3* 7.0* 8.7*  HCT 21.2* 24.1* 23.1* 22.7* 28.2*  MCV 108.2* 101.3* 103.1* 105.1* 101.1*  PLT 155 156 147* 144* 136*    Basic Metabolic Panel: Recent Labs  Lab 04/15/21 0508 04/16/21 0435 04/17/21 0334 04/18/21 1206 04/19/21 0236  NA 135 133* 131* 135 136  K 5.1 3.6 6.0* 3.9 4.1  CL 97* 95* 96* 97* 99  CO2 28 28 24 28 27   GLUCOSE 157* 184* 159* 132* 158*  BUN 29* 15 27* 23 32*  CREATININE 10.64* 5.82* 8.43* 7.03* 8.31*  CALCIUM 8.9 8.7* 8.4* 8.5* 8.3*  PHOS  --   --  3.6 2.5 3.1    GFR: Estimated Creatinine Clearance: 12.6 mL/min (A) (by C-G formula based on SCr of 8.31 mg/dL (H)).  Liver Function Tests: Recent Labs  Lab 04/17/21 0334 04/18/21 1206 04/19/21 0236  ALBUMIN 2.2* 2.5* 2.1*     CBG: Recent Labs  Lab 04/20/21 0559 04/20/21 1244 04/20/21 1655 04/20/21 2001 04/21/21 0811  GLUCAP 116* 150* 122* 116* 100*     Recent Results (from the past 240 hour(s))  Resp Panel by RT-PCR (Flu A&B, Covid) Nasopharyngeal Swab     Status: None   Collection Time: 04/14/21 10:25 AM   Specimen: Nasopharyngeal Swab; Nasopharyngeal(NP) swabs in vial transport medium  Result  Value Ref Range Status   SARS Coronavirus 2 by RT PCR NEGATIVE NEGATIVE Final    Comment: (NOTE) SARS-CoV-2 target nucleic acids are NOT DETECTED.  The SARS-CoV-2 RNA is generally detectable in upper respiratory specimens during the acute phase of infection. The lowest concentration of SARS-CoV-2 viral copies this assay can  detect is 138 copies/mL. A negative result does not preclude SARS-Cov-2 infection and should not be used as the sole basis for treatment or other patient management decisions. A negative result may occur with  improper specimen collection/handling, submission of specimen other than nasopharyngeal swab, presence of viral mutation(s) within the areas targeted by this assay, and inadequate number of viral copies(<138 copies/mL). A negative result must be combined with clinical observations, patient history, and epidemiological information. The expected result is Negative.  Fact Sheet for Patients:  EntrepreneurPulse.com.au  Fact Sheet for Healthcare Providers:  IncredibleEmployment.be  This test is no t yet approved or cleared by the [place] FDA and  has been authorized for detection and/or diagnosis of SARS-CoV-2 by FDA under an Emergency Use Authorization (EUA). This EUA will remain  in effect (meaning this test can be used) for the duration of the COVID-19 declaration under Section 564(b)(1) of the Act, 21 U.S.C.section 360bbb-3(b)(1), unless the authorization is terminated  or revoked sooner.       Influenza A by PCR NEGATIVE NEGATIVE Final   Influenza B by PCR NEGATIVE NEGATIVE Final    Comment: (NOTE) The Xpert Xpress SARS-CoV-2/FLU/RSV plus assay is intended as an aid in the diagnosis of influenza from Nasopharyngeal swab specimens and should not be used as a sole basis for treatment. Nasal washings and aspirates are unacceptable for Xpert Xpress SARS-CoV-2/FLU/RSV testing.  Fact Sheet for Patients: EntrepreneurPulse.com.au  Fact Sheet for Healthcare Providers: IncredibleEmployment.be  This test is not yet approved or cleared by the [place] FDA and has been authorized for detection and/or diagnosis of SARS-CoV-2 by FDA under an Emergency Use Authorization (EUA). This EUA will remain in  effect (meaning this test can be used) for the duration of the COVID-19 declaration under Section 564(b)(1) of the Act, 21 U.S.C. section 360bbb-3(b)(1), unless the authorization is terminated or revoked.  Performed at [facility], [place] [address], [place], [place] [address]   Blood culture (routine x 2)     Status: None   Collection Time: 04/14/21 10:30 AM   Specimen: BLOOD  Result Value Ref Range Status   Specimen Description BLOOD RIGHT ANTECUBITAL  Final   Special Requests   Final    BOTTLES DRAWN AEROBIC AND ANAEROBIC Blood Culture adequate volume   Culture   Final    NO GROWTH 5 DAYS Performed at [facility], [place] [address], [place], [place] [address]    Report Status 04/19/2021 FINAL  Final  Blood culture (routine x 2)     Status: None   Collection Time: 04/14/21 10:35 AM   Specimen: BLOOD RIGHT HAND  Result Value Ref Range Status   Specimen Description BLOOD RIGHT HAND  Final   Special Requests   Final    BOTTLES DRAWN AEROBIC AND ANAEROBIC Blood Culture adequate volume   Culture   Final    NO GROWTH 5 DAYS Performed at [facility], [place] [address], [place], [place] [address]    Report Status 04/19/2021 FINAL  Final  Surgical pcr screen     Status: Abnormal   Collection Time: 04/15/21  4:48 PM   Specimen: Nasal Mucosa; Nasal  Swab  Result Value Ref Range Status   MRSA, PCR NEGATIVE NEGATIVE Final   Staphylococcus aureus POSITIVE (A) NEGATIVE Final    Comment: (NOTE) The Xpert SA Assay (FDA approved for NASAL specimens in patients 23 years of age and older), is one component of a comprehensive surveillance program. It is not intended to diagnose infection nor to guide or monitor treatment. Performed at [facility], [place] [address] [place], [place] [address]       Radiology Studies: No results found.   LOS: 7 days   Bonnielee Haff, MD [facility]  04/21/2021, 10:29 AM

## 2021-04-21 NOTE — NC FL2 (Signed)
MEDICAID FL2 LEVEL OF CARE SCREENING TOOL     IDENTIFICATION  Patient Name: Daniel [name] Birthdate: 1960-05-17 Sex: male Admission Date (Current Location): 04/14/2021  [facility] and [place] Number:  Herbalist and Address:  [facility], [place] [address] [place], [place] [address]      Provider Number: [id number]  Attending Physician Name and Address:  Bonnielee Haff, MD  Relative Name and Phone Number:  Josephina Gip, [phone]    Current Level of Care: Hospital Recommended Level of Care: [place] Prior Approval Number:    Date Approved/Denied:   PASRR Number: [id number]  Discharge Plan: SNF    Current Diagnoses: Patient Active Problem List   Diagnosis Date Noted   Diabetic foot infection ([place]) 04/14/2021   PAD (peripheral artery disease) ([place]) 04/08/2021   Gastritis and gastroduodenitis    End-stage renal disease on hemodialysis ([place]) 03/25/2021   Diabetes mellitus type 2, controlled, with complications ([place]) 96/78/9381   QT prolongation 03/25/2021   Alcohol abuse with intoxication ([place]) 03/25/2021   Cocaine abuse ([place]) 03/25/2021   Nicotine abuse 03/25/2021   Class 2 obesity due to excess calories with body mass index (BMI) of 39.0 to 39.9 in adult 03/25/2021   AVM (arteriovenous malformation) of duodenum, acquired    Peptic ulcer disease    Upper GI bleed 03/24/2021   Chalazion of right upper eyelid 03/24/2021   Thrombocytopenia ([place]) 03/24/2021   Visual hallucination 03/24/2021   Bilateral leg edema 03/24/2021   Acute blood loss anemia    Benign neoplasm of duodenum, jejunum, and ileum    Anemia due to chronic kidney disease 02/23/2021   Prolonged QT interval 02/23/2021   Rotator cuff tear arthropathy of right shoulder 01/23/2021   Alcohol dependence ([place]) 01/13/2021   Gastric ulcer with hemorrhage 01/13/2021   Generalized weakness    Transaminitis 01/10/2021   History of alcohol  abuse 10/27/2020   History of cocaine abuse ([place]) 10/27/2020   Nicotine dependence, cigarettes, uncomplicated 01/75/1025   Acute GI bleeding 08/10/2020   Gastrointestinal hemorrhage with melena    Duodenal ulcer with hemorrhage    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   ESRD on hemodialysis ([place])    AVM (arteriovenous malformation) of small bowel, acquired with hemorrhage    Symptomatic anemia 07/31/2019   Acute on chronic anemia 07/23/2019   Chronic hepatitis C without hepatic coma ([place]) 07/11/2019   Iron deficiency anemia 03/30/2019   Alcohol use disorder 03/30/2019   B12 deficiency 03/30/2019   Orthostatic hypotension 01/22/2019   Essential hypertension 06/29/2016   DM2 (diabetes mellitus, type 2) ([place]) 06/29/2016    Orientation RESPIRATION BLADDER Height & Weight     Self, Time, Situation, Place  Normal Continent Weight: 277 lb 12.5 oz (126 kg) Height:  5\' 11"  (180.3 cm)  BEHAVIORAL SYMPTOMS/MOOD NEUROLOGICAL BOWEL NUTRITION STATUS      Continent Diet (renal/carb modified)  AMBULATORY STATUS COMMUNICATION OF NEEDS Skin   Limited Assist Verbally Normal                       Personal Care Assistance Level of Assistance  Bathing, Feeding, Dressing Bathing Assistance: Limited assistance Feeding assistance: Limited assistance Dressing Assistance: Limited assistance     Functional Limitations Info             SPECIAL CARE FACTORS FREQUENCY  PT (By licensed PT), OT (By licensed OT)     PT Frequency: 5x weekly OT  Frequency: 5x weekly            Contractures Contractures Info: Not present    Additional Factors Info  Code Status, Allergies Code Status Info: full Allergies Info: dilaudid           Current Medications (04/21/2021):  This is the current hospital active medication list Current Facility-Administered Medications  Medication Dose Route Frequency Provider Last Rate Last Admin   (feeding supplement) PROSource Plus liquid 30 mL   30 mL Oral BID BM Valentina Gu, NP   30 mL at 04/21/21 0812   0.9 %  sodium chloride infusion (Manually program via Guardrails IV Fluids)   Intravenous Once Rhyne, Samantha J, PA-C       acetaminophen (TYLENOL) tablet 650 mg  650 mg Oral Q6H PRN Gabriel Earing, PA-C   650 mg at 04/20/21 1246   Or   acetaminophen (TYLENOL) suppository 650 mg  650 mg Rectal Q6H PRN Rhyne, Samantha J, PA-C       acetaminophen (TYLENOL) tablet 650 mg  650 mg Oral Once Rhyne, Samantha J, PA-C       amLODipine (NORVASC) tablet 10 mg  10 mg Oral QHS Rhyne, Samantha J, PA-C   10 mg at 04/20/21 2154   calcium acetate (PHOSLO) capsule 1,334 mg  1,334 mg Oral TID WC Rhyne, Samantha J, PA-C   1,334 mg at 04/21/21 6222   calcium carbonate (dosed in mg elemental calcium) suspension 500 mg of elemental calcium  500 mg of elemental calcium Oral Q6H PRN Rhyne, Samantha J, PA-C       camphor-menthol (SARNA) lotion 1 application  1 application Topical L7L PRN Rhyne, Samantha J, PA-C       Chlorhexidine Gluconate Cloth 2 % PADS 6 each  6 each Topical Q0600 Rhyne, Hulen Shouts, PA-C   6 each at 04/21/21 0900   cloNIDine (CATAPRES) tablet 0.1 mg  0.1 mg Oral Once per day on Tue Thu Sat Rhyne, Samantha J, PA-C   0.1 mg at 04/20/21 2155   cloNIDine (CATAPRES) tablet 0.1 mg  0.1 mg Oral 2 times per day on Sun Mon Wed Fri Rhyne, Samantha J, PA-C   0.1 mg at 04/21/21 0930   diphenhydrAMINE (BENADRYL) injection 25 mg  25 mg Intravenous Once Rhyne, Samantha J, PA-C       feeding supplement (NEPRO CARB STEADY) liquid 237 mL  237 mL Oral TID PRN Rhyne, Samantha J, PA-C       feeding supplement (NEPRO CARB STEADY) liquid 237 mL  237 mL Oral BID BM Rhyne, Samantha J, PA-C   237 mL at 04/20/21 1430   gabapentin (NEURONTIN) capsule 100 mg  100 mg Oral TID Rhyne, Samantha J, PA-C   100 mg at 04/21/21 0930   heparin injection 5,000 Units  5,000 Units Subcutaneous Q8H Rhyne, Samantha J, PA-C   5,000 Units at 04/21/21 0524   hydrALAZINE  (APRESOLINE) injection 5 mg  5 mg Intravenous Q4H PRN Rhyne, Samantha J, PA-C       HYDROcodone-acetaminophen (NORCO/VICODIN) 5-325 MG per tablet 1-2 tablet  1-2 tablet Oral Q4H PRN Rhyne, Hulen Shouts, PA-C   2 tablet at 04/20/21 2153   hydrOXYzine (ATARAX) tablet 25 mg  25 mg Oral Q8H PRN Rhyne, Hulen Shouts, PA-C   25 mg at 04/19/21 2151   insulin aspart (novoLOG) injection 0-6 Units  0-6 Units Subcutaneous TID WC Rhyne, Samantha J, PA-C   1 Units at 04/19/21 1651   ketorolac (TORADOL) 15 MG/ML injection  15 mg  15 mg Intravenous Q6H PRN Gabriel Earing, PA-C   15 mg at 04/21/21 4496   loperamide (IMODIUM) capsule 4 mg  4 mg Oral TID Bonnielee Haff, MD       multivitamin (RENA-VIT) tablet 1 tablet  1 tablet Oral QHS Rhyne, Hulen Shouts, PA-C   1 tablet at 04/20/21 2154   nicotine (NICODERM CQ - dosed in mg/24 hours) patch 14 mg  14 mg Transdermal Daily Rhyne, Hulen Shouts, PA-C   14 mg at 04/21/21 7591   pantoprazole (PROTONIX) EC tablet 40 mg  40 mg Oral BID Gabriel Earing, PA-C   40 mg at 04/21/21 6384   saccharomyces boulardii (FLORASTOR) capsule 250 mg  250 mg Oral BID Bonnielee Haff, MD   250 mg at 04/21/21 6659   sorbitol 70 % solution 30 mL  30 mL Oral PRN Gabriel Earing, PA-C         Discharge Medications: Please see discharge summary for a list of discharge medications.  Relevant Imaging Results:  Relevant Lab Results:   Additional Information No known allergies, SSN : [id number]  Alfredia Ferguson, LCSW

## 2021-04-22 DIAGNOSIS — R197 Diarrhea, unspecified: Secondary | ICD-10-CM | POA: Diagnosis not present

## 2021-04-22 DIAGNOSIS — R259 Unspecified abnormal involuntary movements: Secondary | ICD-10-CM | POA: Diagnosis not present

## 2021-04-22 DIAGNOSIS — I10 Essential (primary) hypertension: Secondary | ICD-10-CM | POA: Diagnosis not present

## 2021-04-22 LAB — CBC
HCT: 26.7 % — ABNORMAL LOW (ref 39.0–52.0)
Hemoglobin: 8.4 g/dL — ABNORMAL LOW (ref 13.0–17.0)
MCH: 30.9 pg (ref 26.0–34.0)
MCHC: 31.5 g/dL (ref 30.0–36.0)
MCV: 98.2 fL (ref 80.0–100.0)
Platelets: ADEQUATE 10*3/uL (ref 150–400)
RBC: 2.72 MIL/uL — ABNORMAL LOW (ref 4.22–5.81)
RDW: 24.2 % — ABNORMAL HIGH (ref 11.5–15.5)
WBC: 6.8 10*3/uL (ref 4.0–10.5)
nRBC: 1.6 % — ABNORMAL HIGH (ref 0.0–0.2)

## 2021-04-22 LAB — GLUCOSE, CAPILLARY
Glucose-Capillary: 112 mg/dL — ABNORMAL HIGH (ref 70–99)
Glucose-Capillary: 181 mg/dL — ABNORMAL HIGH (ref 70–99)
Glucose-Capillary: 107 mg/dL — ABNORMAL HIGH (ref 70–99)
Glucose-Capillary: 133 mg/dL — ABNORMAL HIGH (ref 70–99)

## 2021-04-22 MED ORDER — PROSOURCE PLUS PO LIQD
30.0000 mL | Freq: Three times a day (TID) | ORAL | Status: DC
  Administered 2021-04-22 – 2021-04-23 (×2): 30 mL via ORAL
  Filled 2021-04-22 (×7): qty 30

## 2021-04-22 MED ORDER — LIDOCAINE-PRILOCAINE 2.5-2.5 % EX CREA
1.0000 "application " | TOPICAL_CREAM | CUTANEOUS | Status: DC | PRN
  Filled 2021-04-22: qty 5

## 2021-04-22 MED ORDER — LOPERAMIDE HCL 2 MG PO CAPS
4.0000 mg | ORAL_CAPSULE | Freq: Three times a day (TID) | ORAL | Status: AC
  Administered 2021-04-22 (×3): 4 mg via ORAL
  Filled 2021-04-22 (×3): qty 2

## 2021-04-22 MED ORDER — SODIUM CHLORIDE 0.9 % IV SOLN: 100.0000 mL | INTRAVENOUS | Status: DC | PRN

## 2021-04-22 MED ORDER — HEPARIN SODIUM (PORCINE) 1000 UNIT/ML DIALYSIS
1000.0000 [IU] | INTRAMUSCULAR | Status: DC | PRN
  Administered 2021-04-23: 3800 [IU] via INTRAVENOUS_CENTRAL
  Filled 2021-04-22 (×3): qty 1

## 2021-04-22 MED ORDER — LIDOCAINE HCL (PF) 1 % IJ SOLN
5.0000 mL | INTRAMUSCULAR | Status: DC | PRN
  Filled 2021-04-22: qty 5

## 2021-04-22 MED ORDER — ALTEPLASE 2 MG IJ SOLR
2.0000 mg | Freq: Once | INTRAMUSCULAR | Status: DC | PRN
  Filled 2021-04-22: qty 2

## 2021-04-22 MED ORDER — PENTAFLUOROPROP-TETRAFLUOROETH EX AERO: 1.0000 "application " | INHALATION_SPRAY | CUTANEOUS | Status: DC | PRN

## 2021-04-22 NOTE — Progress Notes (Signed)
[facility] Progress Note   Subjective:    Seen and examined at bedside. Still c/o pain L stump. Denies SOB, CP, and N/V. Currently asking about going home.  Objective Vitals:   04/21/21 1505 04/21/21 2001 04/22/21 0828 04/22/21 1412  BP: 128/76 (!) 111/39 (!) 154/119 (!) 129/57  Pulse: 88 90 92 83  Resp: 17  17 18   Temp: 97.9 F (36.6 C) 98.1 F (36.7 C) 98.1 F (36.7 C) 97.9 F (36.6 C)  TempSrc: Oral Oral Oral Oral  SpO2: 98% 99% 99% 100%  Weight:      Height:       Physical Exam General: Older male; NAD Heart: Normal S1 and S2; No murmurs, gallops, or rubs Lungs: Clear throughout; No wheezing, rales, or rhonchi Abdomen: Large, soft, and non-tender Extremities: L BKA-wrapped with ace wrap; No edema RLE Dialysis Access: L AVF-surgical glue intact; RIJ [facility]   Filed Weights   04/17/21 1245 04/20/21 0805 04/20/21 1130  Weight: 123.5 kg 128.5 kg 126 kg    Intake/Output Summary (Last 24 hours) at 04/22/2021 1625 Last data filed at 04/22/2021 1100 Gross per 24 hour  Intake 330 ml  Output --  Net 330 ml    Additional Objective Labs: Basic Metabolic Panel: Recent Labs  Lab 04/17/21 0334 04/18/21 1206 04/19/21 0236  NA 131* 135 136  K 6.0* 3.9 4.1  CL 96* 97* 99  CO2 24 28 27   GLUCOSE 159* 132* 158*  BUN 27* 23 32*  CREATININE 8.43* 7.03* 8.31*  CALCIUM 8.4* 8.5* 8.3*  PHOS 3.6 2.5 3.1   Liver Function Tests: Recent Labs  Lab 04/17/21 0334 04/18/21 1206 04/19/21 0236  ALBUMIN 2.2* 2.5* 2.1*   No results for input(s): LIPASE, AMYLASE in the last 168 hours. CBC: Recent Labs  Lab 04/17/21 0334 04/18/21 0345 04/19/21 0236 04/20/21 0236 04/21/21 0500 04/22/21 0358  WBC 11.2* 9.1 5.9 6.8 7.4 6.8  NEUTROABS 8.5*  --   --   --   --   --   HGB 6.7* 7.8* 7.3* 7.0* 8.7* 8.4*  HCT 21.2* 24.1* 23.1* 22.7* 28.2* 26.7*  MCV 108.2* 101.3* 103.1* 105.1* 101.1* 98.2  PLT 155 156 147* 144* 136* PLATELET CLUMPS NOTED ON SMEAR, COUNT APPEARS  ADEQUATE   Blood Culture    Component Value Date/Time   SDES BLOOD RIGHT HAND 04/14/2021 1035   SPECREQUEST  04/14/2021 1035    BOTTLES DRAWN AEROBIC AND ANAEROBIC Blood Culture adequate volume   CULT  04/14/2021 1035    NO GROWTH 5 DAYS Performed at [facility], [place] [address], [place], [place] [address]    REPTSTATUS 04/19/2021 FINAL 04/14/2021 1035    Cardiac Enzymes: No results for input(s): CKTOTAL, CKMB, CKMBINDEX, TROPONINI in the last 168 hours. CBG: Recent Labs  Lab 04/21/21 1232 04/21/21 1600 04/21/21 2214 04/22/21 0533 04/22/21 1105  GLUCAP 156* 126* 166* 112* 181*   Iron Studies: No results for input(s): IRON, TIBC, TRANSFERRIN, FERRITIN in the last 72 hours. Lab Results  Component Value Date   INR 1.0 03/24/2021   INR 1.1 10/28/2020   INR 1.1 10/27/2020   Studies/Results: No results found.  Medications:   sodium chloride   Intravenous Once   acetaminophen  650 mg Oral Once   amLODipine  10 mg Oral QHS   calcium acetate  1,334 mg Oral TID WC   Chlorhexidine Gluconate Cloth  6 each Topical Q0600   cloNIDine  0.1 mg Oral Once per day on Tue Thu  Sat   cloNIDine  0.1 mg Oral 2 times per day on Sun Mon Wed Fri   diphenhydrAMINE  25 mg Intravenous Once   gabapentin  100 mg Oral TID   heparin  5,000 Units Subcutaneous Q8H   insulin aspart  0-6 Units Subcutaneous TID WC   loperamide  4 mg Oral TID   multivitamin  1 tablet Oral QHS   nicotine  14 mg Transdermal Daily   pantoprazole  40 mg Oral BID   saccharomyces boulardii  250 mg Oral BID    Dialysis Orders: TTS - [place] 4hrs, BFR 450, DFR 500,  [id number], 2K/ 2Ca Mircera 225 mcg IV q2wks - last 04/06/21 No Systemic Heparin Hectorol 47mcg IV with HD-04/13/21 Venofer 100mg  IV X 6 doses-last 04/13/21  Assessment/Plan: Diabetic L foot infection/PVD-Underwent LE angiography 04/08/21 showed limb ischemia L leg. 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 Scot Dock. Currently off abx now. 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 Scot Dock Constipation: per primary service ESRD - T,Th,S -HD off schedule earlier this week. Next HD 04/23/2021.  Hypertension/volume  - Patient euvolemic on exam. Bps stable. Continue current anti-hypertensive regimen, UF as tolerated. Will need lower EDW post BKA on discharge. post hd edw 12/24 126kg-challenge further Anemia of CKD - S/P 2 units PRBCs 04/17/21. S/p transfusion w/ hd yesterday, hgb up to 8.4 today Secondary Hyperparathyroidism - will check PO4 in am  Nutrition - renal diet with fluid restrictions. Add protein supps   Tobie Poet, NP [facility] 04/22/2021,4:25 PM  LOS: 8 days

## 2021-04-22 NOTE — Progress Notes (Signed)
PROGRESS NOTE    Daniel [name]  YSA:[id number] DOB: [birth year] DOA: 04/14/2021 PCP: [name], NP    Brief Narrative/Hospital Course: Daniel [name], 60 y.o. male with PMH of ESRD on TTS HD; DM; HTN; ICH in 2019; and  B/L LE PAD presenting with foot infection.  He was last admitted from 12/12-13 for PAD and had aortogram with cannulation of the R common femoral artery, he does not have revascularization options available and so was to f/u with Dr. Scot Dock in 1-2 weeks to discuss TMA vs. BKA now presents with worsening pain and swelling in his foot. In the ED afebrile vitals stable labs with creatinine 8.6, CRP 1.7 lactic acid 1.6, normal WBC count anemia 8.9 g, xray- are fractures through the bases of the third, fourth, and fifth proximal phalanges, unchanged since March 30, 2021 \\We  were consulted, placed on vancomycin,Flagyl and ceftriaxone and admitted for further management.   Subjective: Patient mentions that he is feeling better today.  Less pain in the left lower extremity.  Diarrhea appears to be subsiding.  Denies any abdominal pain, nausea or vomiting.     Assessment & Plan:  Diabetic foot infection of the left foot Bilateral lower extremity PVD Patient was seen by vascular surgery.  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   Patient was on vancomycin ceftriaxone and metronidazole.  These have been discontinued.   Seems to be stable.  Vascular surgery continues to follow.  We will wait for their input tomorrow.  Essential hypertension Remains on amlodipine and clonidine.  Monitor blood pressures.  Diarrhea Initially thought to be due to antibiotics but he has been off of them for 2 days now.   Abdomen remains benign.  He does not have any fever.  WBC is normal.  Do not suspect any infectious etiology. Continue Imodium and probiotics.    ESRD on HD TTS/Metabolic bone disease Nephrology is following. 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   Initially dialyzed off schedule.  Now back on his usual schedule.  To be dialyzed again tomorrow.  Anemia of renal disease Patient was transfused 2 units of PRBC with hemodialysis on 12/21.  Hemoglobin has been drifting down.  Down to 7.0.  No overt bleeding noted.  Has not had any melanotic stools or blood in the stool.  No bleeding noted from the stump area.  This could all be reflective of his kidney disease.   Transfused additional 2 units of PRBC with hemodialysis on 12/24.   Hemoglobin noted to be stable.  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   Just on SSI.  CBGs are reasonably well controlled.  Involuntary movement of upper extremities He reported occasional jerking movements of his upper extremities.  Noted to be on Neurontin which can cause tremors.  He is noted to be on Seroquel which can cause tardive dyskinesia and dystonia.  Seroquel was discontinued.  His symptoms have resolved.  We will add Seroquel to his allergy list.    Obesity Estimated body mass index is 38.74 kg/m as calculated from the following:   Height as of this encounter: 5\' 11"  (1.803 m).   Weight as of this encounter: 126 kg.   DVT prophylaxis: heparin injection 5,000 Units Start: 04/14/21 1400 Code Status:   Code Status: Full Code Family Communication: Discussed with patient Disposition: Hopefully to SNF tomorrow once cleared by  vascular surgery.   Status is: Inpatient  Remains inpatient appropriate because: Status post left BKA.  Anemia.  End-stage renal disease.   Objective: Vitals last 24 hrs: Vitals:   04/21/21 0815 04/21/21 1505 04/21/21 2001 04/22/21 0828  BP: 134/73 128/76 (!) 111/39 (!) 154/119  Pulse: 89 88 90 92  Resp: 17 17  17   Temp: 97.8 F (36.6 C) 97.9 F (36.6 C) 98.1 F (36.7 C) 98.1 F (36.7 C)  TempSrc: Oral Oral Oral Oral  SpO2: 95%  98% 99% 99%  Weight:      Height:       Weight change:   Intake/Output Summary (Last 24 hours) at 04/22/2021 1140 Last data filed at 04/22/2021 1100 Gross per 24 hour  Intake 330 ml  Output --  Net 330 ml    Net IO Since Admission: -3,882.29 mL [04/22/21 1140]    Physical Examination:  General appearance: Awake alert.  In no distress Resp: Clear to auscultation bilaterally.  Normal effort Cardio: S1-S2 is normal regular.  No S3-S4.  No rubs murmurs or bruit GI: Abdomen is soft.  Nontender nondistended.  Bowel sounds are present normal.  No masses organomegaly Extremities: Left lower extremity stump is covered in dressing.  No bleeding noted. Neurologic: Alert and oriented x3.  No focal neurological deficits.       Medications reviewed:  Scheduled Meds:  (feeding supplement) PROSource Plus  30 mL Oral BID BM   sodium chloride   Intravenous Once   acetaminophen  650 mg Oral Once   amLODipine  10 mg Oral QHS   calcium acetate  1,334 mg Oral TID WC   Chlorhexidine Gluconate Cloth  6 each Topical Q0600   cloNIDine  0.1 mg Oral Once per day on Tue Thu Sat   cloNIDine  0.1 mg Oral 2 times per day on Sun Mon Wed Fri   diphenhydrAMINE  25 mg Intravenous Once   feeding supplement (NEPRO CARB STEADY)  237 mL Oral BID BM   gabapentin  100 mg Oral TID   heparin  5,000 Units Subcutaneous Q8H   insulin aspart  0-6 Units Subcutaneous TID WC   loperamide  4 mg Oral TID   multivitamin  1 tablet Oral QHS   nicotine  14 mg Transdermal Daily   pantoprazole  40 mg Oral BID   saccharomyces boulardii  250 mg Oral BID   Continuous Infusions:    Diet Order             Diet renal/carb modified with fluid restriction Diet-HS Snack? Nothing; Fluid restriction: 1200 mL Fluid; Room service appropriate? Yes; Fluid consistency: Thin  Diet effective now                   Weight change:   Wt Readings from Last 3 Encounters:  04/20/21 126 kg  04/09/21 127.2 kg  04/04/21 134.3 kg      Consultants: vascular surgery.  Nephrology.  Interventional radiology    Antimicrobials: Anti-infectives (From admission, onward)    Start     Dose/Rate Route Frequency Ordered Stop   04/19/21 0845  ceFAZolin (ANCEF) IVPB 3g/100 mL premix        3 g 200 mL/hr over 30 Minutes Intravenous  Once 04/19/21 0840 04/19/21 1027   04/19/21 0841  ceFAZolin (ANCEF) 2-4 GM/100ML-% IVPB       Note to Pharmacy: Cameron Sprang M: cabinet override      04/19/21 0841 04/19/21 2044   04/17/21 1200  vancomycin (  VANCOCIN) IVPB 1000 mg/200 mL premix        1,000 mg 200 mL/hr over 60 Minutes Intravenous Every Wed (Hemodialysis) 04/16/21 1339 04/17/21 1443   04/16/21 0200  vancomycin (VANCOCIN) IVPB 1000 mg/200 mL premix        1,000 mg 200 mL/hr over 60 Minutes Intravenous  Once 04/15/21 2103 04/16/21 0625   04/15/21 1220  vancomycin variable dose per unstable renal function (pharmacist dosing)  Status:  Discontinued         Does not apply See admin instructions 04/15/21 1220 04/17/21 0724   04/14/21 1130  vancomycin (VANCOCIN) 2,500 mg in sodium chloride 0.9 % 500 mL IVPB        2,500 mg 250 mL/hr over 120 Minutes Intravenous  Once 04/14/21 1120 04/14/21 2006   04/14/21 1115  cefTRIAXone (ROCEPHIN) 2 g in sodium chloride 0.9 % 100 mL IVPB        2 g 200 mL/hr over 30 Minutes Intravenous Every 24 hours 04/14/21 1109 04/17/21 2359   04/14/21 1115  metroNIDAZOLE (FLAGYL) IVPB 500 mg        500 mg 100 mL/hr over 60 Minutes Intravenous Every 8 hours 04/14/21 1109 04/17/21 2359   04/14/21 0945  piperacillin-tazobactam (ZOSYN) IVPB 2.25 g  Status:  Discontinued        2.25 g 100 mL/hr over 30 Minutes Intravenous Every 8 hours 04/14/21 0940 04/14/21 1109      Culture/Microbiology    Component Value Date/Time   SDES BLOOD RIGHT HAND 04/14/2021 1035   SPECREQUEST  04/14/2021 1035    BOTTLES DRAWN AEROBIC AND ANAEROBIC Blood Culture adequate volume   CULT  04/14/2021 1035    NO GROWTH 5  DAYS Performed at [facility], [place] [address] [place], [place] [address]    REPTSTATUS 04/19/2021 FINAL 04/14/2021 1035    Other culture-see note  Unresulted Labs (From admission, onward)    None       Data Reviewed: I have personally reviewed following labs and imaging studies  CBC: Recent Labs  Lab 04/17/21 0334 04/18/21 0345 04/19/21 0236 04/20/21 0236 04/21/21 0500 04/22/21 0358  WBC 11.2* 9.1 5.9 6.8 7.4 6.8  NEUTROABS 8.5*  --   --   --   --   --   HGB 6.7* 7.8* 7.3* 7.0* 8.7* 8.4*  HCT 21.2* 24.1* 23.1* 22.7* 28.2* 26.7*  MCV 108.2* 101.3* 103.1* 105.1* 101.1* 98.2  PLT 155 156 147* 144* 136* PLATELET CLUMPS NOTED ON SMEAR, COUNT APPEARS ADEQUATE    Basic Metabolic Panel: Recent Labs  Lab 04/16/21 0435 04/17/21 0334 04/18/21 1206 04/19/21 0236  NA 133* 131* 135 136  K 3.6 6.0* 3.9 4.1  CL 95* 96* 97* 99  CO2 28 24 28 27   GLUCOSE 184* 159* 132* 158*  BUN 15 27* 23 32*  CREATININE 5.82* 8.43* 7.03* 8.31*  CALCIUM 8.7* 8.4* 8.5* 8.3*  PHOS  --  3.6 2.5 3.1    GFR: Estimated Creatinine Clearance: 12.6 mL/min (A) (by C-G formula based on SCr of 8.31 mg/dL (H)).  Liver Function Tests: Recent Labs  Lab 04/17/21 0334 04/18/21 1206 04/19/21 0236  ALBUMIN 2.2* 2.5* 2.1*     CBG: Recent Labs  Lab 04/21/21 1232 04/21/21 1600 04/21/21 2214 04/22/21 0533 04/22/21 1105  GLUCAP 156* 126* 166* 112* 181*     Recent Results (from the past 240 hour(s))  Resp Panel by RT-PCR (Flu A&B, Covid) Nasopharyngeal Swab     Status: None   Collection  Time: 04/14/21 10:25 AM   Specimen: Nasopharyngeal Swab; Nasopharyngeal(NP) swabs in vial transport medium  Result Value Ref Range Status   SARS Coronavirus 2 by RT PCR NEGATIVE NEGATIVE Final    Comment: (NOTE) SARS-CoV-2 target nucleic acids are NOT DETECTED.  The SARS-CoV-2 RNA is generally detectable in upper respiratory specimens during the acute phase of infection. The lowest concentration of  SARS-CoV-2 viral copies this assay can detect is 138 copies/mL. A negative result does not preclude SARS-Cov-2 infection and should not be used as the sole basis for treatment or other patient management decisions. A negative result may occur with  improper specimen collection/handling, submission of specimen other than nasopharyngeal swab, presence of viral mutation(s) within the areas targeted by this assay, and inadequate number of viral copies(<138 copies/mL). A negative result must be combined with clinical observations, patient history, and epidemiological information. The expected result is Negative.  Fact Sheet for Patients:  EntrepreneurPulse.com.au  Fact Sheet for Healthcare Providers:  IncredibleEmployment.be  This test is no t yet approved or cleared by the [place] FDA and  has been authorized for detection and/or diagnosis of SARS-CoV-2 by FDA under an Emergency Use Authorization (EUA). This EUA will remain  in effect (meaning this test can be used) for the duration of the COVID-19 declaration under Section 564(b)(1) of the Act, 21 U.S.C.section 360bbb-3(b)(1), unless the authorization is terminated  or revoked sooner.       Influenza A by PCR NEGATIVE NEGATIVE Final   Influenza B by PCR NEGATIVE NEGATIVE Final    Comment: (NOTE) The Xpert Xpress SARS-CoV-2/FLU/RSV plus assay is intended as an aid in the diagnosis of influenza from Nasopharyngeal swab specimens and should not be used as a sole basis for treatment. Nasal washings and aspirates are unacceptable for Xpert Xpress SARS-CoV-2/FLU/RSV testing.  Fact Sheet for Patients: EntrepreneurPulse.com.au  Fact Sheet for Healthcare Providers: IncredibleEmployment.be  This test is not yet approved or cleared by the [place] FDA and has been authorized for detection and/or diagnosis of SARS-CoV-2 by FDA under an Emergency Use  Authorization (EUA). This EUA will remain in effect (meaning this test can be used) for the duration of the COVID-19 declaration under Section 564(b)(1) of the Act, 21 U.S.C. section 360bbb-3(b)(1), unless the authorization is terminated or revoked.  Performed at [facility], [place] [name], [place], [place] [address]   Blood culture (routine x 2)     Status: None   Collection Time: 04/14/21 10:30 AM   Specimen: BLOOD  Result Value Ref Range Status   Specimen Description BLOOD RIGHT ANTECUBITAL  Final   Special Requests   Final    BOTTLES DRAWN AEROBIC AND ANAEROBIC Blood Culture adequate volume   Culture   Final    NO GROWTH 5 DAYS Performed at [facility], [place] [address] [place], [place] [address]    Report Status 04/19/2021 FINAL  Final  Blood culture (routine x 2)     Status: None   Collection Time: 04/14/21 10:35 AM   Specimen: BLOOD RIGHT HAND  Result Value Ref Range Status   Specimen Description BLOOD RIGHT HAND  Final   Special Requests   Final    BOTTLES DRAWN AEROBIC AND ANAEROBIC Blood Culture adequate volume   Culture   Final    NO GROWTH 5 DAYS Performed at [facility], [place] [address], [place], [place] [address]    Report Status 04/19/2021 FINAL  Final  Surgical pcr screen  Status: Abnormal   Collection Time: 04/15/21  4:48 PM   Specimen: Nasal Mucosa; Nasal Swab  Result Value Ref Range Status   MRSA, PCR NEGATIVE NEGATIVE Final   Staphylococcus aureus POSITIVE (A) NEGATIVE Final    Comment: (NOTE) The Xpert SA Assay (FDA approved for NASAL specimens in patients 26 years of age and older), is one component of a comprehensive surveillance program. It is not intended to diagnose infection nor to guide or monitor treatment. Performed at [facility], [place] [address], [place],  [address]       Radiology Studies: No results found.   LOS: 8 days   Bonnielee Haff, MD [facility]  04/22/2021, 11:40 AM

## 2021-04-22 NOTE — TOC Progression Note (Signed)
Transition of Care [facility]) - Progression Note    Patient Details  Name: Daniel [name] MRN: [id number] Date of Birth: October 26, 1960  [facility] CM/SW Contact  Joanne Chars, LCSW Phone Number: 04/22/2021, 11:20 AM  Clinical Narrative:   CSW presented bed offers to pt along with choice document.  Pt chose [facility].  CSW sent message to Helene Kelp at [facility] to confirm bed for tomorrow.      Expected Discharge Plan: Skilled Nursing Facility Barriers to Discharge: SNF Pending bed offer, Continued Medical Work up  Expected Discharge Plan and Services Expected Discharge Plan: [place] Choice: [place] arrangements for the past 2 months: [place] Determinants of Health (SDOH) Interventions    Readmission Risk Interventions Readmission Risk Prevention Plan 02/27/2021 07/07/2019  Transportation Screening Complete Complete  PCP or Specialist Appt within 3-5 Days - Complete  HRI or [place] - Complete  Social Work Consult for [place] Planning/Counseling - Complete  Palliative Care Screening - Not Applicable  Medication Review Press photographer) Complete Complete  PCP or Specialist appointment within 3-5 days of discharge Complete -  [place] or Home Care Consult Complete -  SW Recovery Care/Counseling Consult Complete -  Palliative Care Screening Not Applicable -  [place] Not Applicable -  Some recent data might be hidden

## 2021-04-22 NOTE — Progress Notes (Signed)
Physical Therapy Treatment Patient Details Name: RHYSE LOUX MRN: [id number] DOB: [birth year] Today's Date: 04/22/2021   History of Present Illness [name], 60 y.o. male admitted 12/18 presenting with foot infection.  He was last admitted from 12/12-13 for PAD and had aortogram with cannulation of the R common femoral artery.  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   PMH of ESRD on TTS HD; DM; HTN; ICH in 2019; and  B/L LE PAD    PT Comments    Pt tolerates treatment well, ambulating for multiple attempts. Pt demonstrates improved transfer quality and is very determined to continue progressing toward independence. Pt will benefit from continued acute PT services to aide in further increasing activity tolerance and to initiate stair training. PT continues to recommend SNF placement to aide in reducing falls risk.   Recommendations for follow up therapy are one component of a multi-disciplinary discharge planning process, led by the attending physician.  Recommendations may be updated based on patient status, additional functional criteria and insurance authorization.  Follow Up Recommendations  Skilled nursing-short term rehab (<3 hours/day) (denied AIR)     Assistance Recommended at Discharge Intermittent Supervision/Assistance  Equipment Recommendations       Recommendations for Other Services       Precautions / Restrictions Precautions Precautions: Fall Restrictions Weight Bearing Restrictions: Yes LLE Weight Bearing: Non weight bearing     Mobility  Bed Mobility Overal bed mobility: Modified Independent             General bed mobility comments: use of rails    Transfers Overall transfer level: Needs assistance Equipment used: Rolling walker (2 wheels) Transfers: Sit to/from Stand Sit to Stand: Min guard     Step pivot transfers: Min guard          Ambulation/Gait Ambulation/Gait assistance: Min guard Gait Distance (Feet): 40 Feet (additional trials  of 30', 15' x 2) Assistive device: Rolling walker (2 wheels) Gait Pattern/deviations:  (hop-to pattern) Gait velocity: reduced Gait velocity interpretation: <1.31 ft/sec, indicative of household ambulator   General Gait Details: pt with slowed hop-to gait, 2 instances of shortened step length as walker slips forward. PT encourages pt to maintain RW closed to base of support   Stairs             Wheelchair Mobility    Modified Rankin (Stroke Patients Only)       Balance Overall balance assessment: Needs assistance Sitting-balance support: No upper extremity supported;Feet supported Sitting balance-Leahy Scale: Good     Standing balance support: Bilateral upper extremity supported;Reliant on assistive device for balance Standing balance-Leahy Scale: Poor Standing balance comment: relies on UE suport on RW and external support with dynamic activity.                            Cognition Arousal/Alertness: Awake/alert Behavior During Therapy: WFL for tasks assessed/performed Overall Cognitive Status: Within Functional Limits for tasks assessed                                          Exercises General Exercises - Lower Extremity Long Arc Quad: AROM;Left;15 reps Other Exercises Other Exercises: PT demonstrates L hip abduction and L hip extension exercise from R sidelying position    General Comments General comments (skin integrity, edema, etc.): VSS on RA      Pertinent Vitals/Pain  Pain Assessment: Faces Faces Pain Scale: Hurts little more Pain Location: residual limb Pain Descriptors / Indicators: Grimacing Pain Intervention(s): Monitored during session    Home Living                          Prior Function            PT Goals (current goals can now be found in the care plan section) Acute Rehab PT Goals Patient Stated Goal: to go to rehab and regain independence Progress towards PT goals: Progressing toward  goals    Frequency    Min 3X/week      PT Plan Current plan remains appropriate    Co-evaluation              AM-PAC PT "6 Clicks" Mobility   Outcome Measure  Help needed turning from your back to your side while in a flat bed without using bedrails?: None Help needed moving from lying on your back to sitting on the side of a flat bed without using bedrails?: None Help needed moving to and from a bed to a chair (including a wheelchair)?: A Little Help needed standing up from a chair using your arms (e.g., wheelchair or bedside chair)?: A Little Help needed to walk in hospital room?: A Little Help needed climbing 3-5 steps with a railing? : Total 6 Click Score: 18    End of Session   Activity Tolerance: Patient tolerated treatment well Patient left: in chair;with call bell/phone within reach;with chair alarm set Nurse Communication: Mobility status PT Visit Diagnosis: Unsteadiness on feet (R26.81);Other abnormalities of gait and mobility (R26.89);Muscle weakness (generalized) (M62.81) Pain - part of body: Leg     Time: 7829-5621 PT Time Calculation (min) (ACUTE ONLY): 30 min  Charges:  $Gait Training: 23-37 mins                     Zenaida Niece, PT, DPT Acute Rehabilitation Pager: [phone] Office [place] 04/22/2021, 3:50 PM

## 2021-04-23 DIAGNOSIS — E11628 Type 2 diabetes mellitus with other skin complications: Secondary | ICD-10-CM | POA: Diagnosis not present

## 2021-04-23 DIAGNOSIS — L089 Local infection of the skin and subcutaneous tissue, unspecified: Secondary | ICD-10-CM | POA: Diagnosis not present

## 2021-04-23 LAB — CBC
HCT: 25.2 % — ABNORMAL LOW (ref 39.0–52.0)
Hemoglobin: 8.1 g/dL — ABNORMAL LOW (ref 13.0–17.0)
MCH: 31.4 pg (ref 26.0–34.0)
MCHC: 32.1 g/dL (ref 30.0–36.0)
MCV: 97.7 fL (ref 80.0–100.0)
Platelets: 152 10*3/uL (ref 150–400)
RBC: 2.58 MIL/uL — ABNORMAL LOW (ref 4.22–5.81)
RDW: 23.7 % — ABNORMAL HIGH (ref 11.5–15.5)
WBC: 6.7 10*3/uL (ref 4.0–10.5)
nRBC: 1.1 % — ABNORMAL HIGH (ref 0.0–0.2)

## 2021-04-23 LAB — RENAL FUNCTION PANEL
Albumin: 2.2 g/dL — ABNORMAL LOW (ref 3.5–5.0)
Anion gap: 13 (ref 5–15)
BUN: 57 mg/dL — ABNORMAL HIGH (ref 8–23)
CO2: 22 mmol/L (ref 22–32)
Calcium: 8.5 mg/dL — ABNORMAL LOW (ref 8.9–10.3)
Chloride: 98 mmol/L (ref 98–111)
Creatinine, Ser: 11.98 mg/dL — ABNORMAL HIGH (ref 0.61–1.24)
GFR, Estimated: 4 mL/min — ABNORMAL LOW (ref >60)
Glucose, Bld: 163 mg/dL — ABNORMAL HIGH (ref 70–99)
Phosphorus: 5.4 mg/dL — ABNORMAL HIGH (ref 2.5–4.6)
Potassium: 4 mmol/L (ref 3.5–5.1)
Sodium: 133 mmol/L — ABNORMAL LOW (ref 135–145)

## 2021-04-23 LAB — GLUCOSE, CAPILLARY
Glucose-Capillary: 117 mg/dL — ABNORMAL HIGH (ref 70–99)
Glucose-Capillary: 121 mg/dL — ABNORMAL HIGH (ref 70–99)
Glucose-Capillary: 96 mg/dL (ref 70–99)
Glucose-Capillary: 126 mg/dL — ABNORMAL HIGH (ref 70–99)

## 2021-04-23 LAB — SARS CORONAVIRUS 2 (TAT 6-24 HRS): SARS Coronavirus 2: NEGATIVE

## 2021-04-23 MED ORDER — HYDROXYZINE HCL 25 MG PO TABS: ORAL_TABLET | ORAL | 0 refills | Status: DC

## 2021-04-23 MED ORDER — RENA-VITE PO TABS: 1.0000 | ORAL_TABLET | Freq: Every day | ORAL | 0 refills | Status: DC

## 2021-04-23 MED ORDER — SACCHAROMYCES BOULARDII 250 MG PO CAPS: 250.0000 mg | ORAL_CAPSULE | Freq: Two times a day (BID) | ORAL | Status: DC

## 2021-04-23 MED ORDER — LOPERAMIDE HCL 2 MG PO CAPS: 4.0000 mg | ORAL_CAPSULE | Freq: Three times a day (TID) | ORAL | 0 refills | Status: DC | PRN

## 2021-04-23 MED ORDER — NICOTINE 14 MG/24HR TD PT24: 14.0000 mg | MEDICATED_PATCH | Freq: Every day | TRANSDERMAL | 0 refills | Status: DC

## 2021-04-23 MED ORDER — HYDROCODONE-ACETAMINOPHEN 5-325 MG PO TABS: 1.0000 | ORAL_TABLET | Freq: Four times a day (QID) | ORAL | 0 refills | Status: DC | PRN

## 2021-04-23 MED ORDER — PROSOURCE PLUS PO LIQD
30.0000 mL | Freq: Three times a day (TID) | ORAL | Status: DC
Start: 2021-04-23 — End: 2021-05-23

## 2021-04-23 MED ORDER — LOPERAMIDE HCL 2 MG PO CAPS
4.0000 mg | ORAL_CAPSULE | Freq: Three times a day (TID) | ORAL | Status: DC | PRN
  Administered 2021-04-23 – 2021-04-24 (×2): 4 mg via ORAL
  Filled 2021-04-23 (×2): qty 2

## 2021-04-23 NOTE — Plan of Care (Signed)

## 2021-04-23 NOTE — TOC Progression Note (Signed)
Transition of Care [facility]) - Progression Note    Patient Details  Name: Daniel [name] MRN: [id number] Date of Birth: 03-05-1961  Transition of [facility] CM/SW Contact  Joanne Chars, LCSW Phone Number: 04/23/2021, 11:01 AM  Clinical Narrative:   TC [facility].  They have a plumbing problem, have had to move several residents, no rooms currently.  Unsure how quickly this will resolve, but will give update later today.     Expected Discharge Plan: Skilled Nursing Facility Barriers to Discharge: SNF Pending bed offer, Continued Medical Work up  Expected Discharge Plan and Services Expected Discharge Plan: [place] Choice: [place] arrangements for the past 2 months: [place] Determinants of Health (SDOH) Interventions    Readmission Risk Interventions Readmission Risk Prevention Plan 02/27/2021 07/07/2019  Transportation Screening Complete Complete  PCP or Specialist Appt within 3-5 Days - Complete  HRI or [place] - Complete  Social Work Consult for [place] Planning/Counseling - Complete  Palliative Care Screening - Not Applicable  Medication Review [employer] photographer) Complete Complete  PCP or Specialist appointment within 3-5 days of discharge Complete -  [place] or Home Care Consult Complete -  SW Recovery Care/Counseling Consult Complete -  Palliative Care Screening Not Applicable -  [place] Not Applicable -  Some recent data might be hidden

## 2021-04-23 NOTE — Discharge Summary (Signed)
[facility]  Physician Discharge Summary   Patient ID: Daniel [name] MRN: [id number] DOB/AGE: February 15, 1961 60 y.o.  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 male with PMH of ESRD on TTS HD; DM; HTN; ICH in 2019; and  B/L LE PAD presenting with foot infection.  He was last admitted from 12/12-13 for PAD and had aortogram with cannulation of the R common femoral artery, he does not have revascularization options available and so was to f/u with Dr. Scot Dock in 1-2 weeks to discuss TMA vs. BKA now presents with worsening pain and swelling in his foot. In the ED afebrile vitals stable labs with creatinine 8.6, CRP 1.7 lactic acid 1.6, normal WBC count anemia 8.9 g, xray- are fractures through the bases of the third, fourth, and fifth proximal phalanges, unchanged since March 30, 2021$RemoveBeforeD' \\We'[id number]$  were consulted, placed on vancomycin,Flagyl and ceftriaxone and admitted for further management.  Consultations: Nephrology Vascular surgery  Procedures: Left below-knee amputation    HOSPITAL COURSE:   Diabetic foot infection of the left foot/Bilateral lower extremity PVD Patient was seen by vascular surgery.  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   Patient was on antibiotics  which were discontinued after his surgery.  Vascular surgery to inspect the incision site today and give further instructions regarding wound care.   Essential hypertension Stable on amlodipine and clonidine.   Diarrhea Improved with Imodium.  WBC is normal.  Abdomen is benign.  No fever.  Do not suspect infectious etiology.  Continue Imodium and probiotics.     ESRD on HD TTS/Metabolic bone disease Nephrology is following. 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   Initially dialyzed off schedule.  Now back on his usual schedule.    Anemia of renal disease Patient was transfused 2 units of PRBC with hemodialysis on 12/21.  Hemoglobin has been drifting down.  Down to 7.0.  No overt bleeding noted.  Has not had any melanotic stools or blood in the stool.  No bleeding noted from the stump area.  This could all be reflective of his kidney disease.   Transfused additional 2 units of PRBC with hemodialysis on 12/24.   Hemoglobin noted to be stable.   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   Just on SSI.  CBGs are reasonably well controlled.   Involuntary movement of upper extremities He reported occasional jerking movements of his upper extremities.  Noted to be on Neurontin which can cause tremors.  He is noted to be on Seroquel which can cause tardive dyskinesia and dystonia.  Seroquel was discontinued.  His symptoms have resolved.  We will add Seroquel to his allergy list.    Obesity Estimated body mass index is 38.74 kg/m as calculated from the following:   Height as of this encounter: 5'  11" (1.803 m).   Weight as of this encounter: 126 kg.   Patient is stable.  Okay for discharge to SNF today.    PERTINENT LABS:  The results of significant diagnostics from this hospitalization (including imaging, microbiology, ancillary and laboratory)  are listed below for reference.    Microbiology: Recent Results (from the past 240 hour(s))  Resp Panel by RT-PCR (Flu A&B, Covid) Nasopharyngeal Swab     Status: None   Collection Time: 04/14/21 10:25 AM   Specimen: Nasopharyngeal Swab; Nasopharyngeal(NP) swabs in vial transport medium  Result Value Ref Range Status   SARS Coronavirus 2 by RT PCR NEGATIVE NEGATIVE Final    Comment: (NOTE) SARS-CoV-2 target nucleic acids are NOT DETECTED.  The SARS-CoV-2 RNA is generally detectable in upper respiratory specimens during the acute phase of infection. The lowest concentration of SARS-CoV-2 viral copies this assay can detect is 138 copies/mL. A negative result does not preclude SARS-Cov-2 infection and should not be used as the sole basis for treatment or other patient management decisions. A negative result may occur with  improper specimen collection/handling, submission of specimen other than nasopharyngeal swab, presence of viral mutation(s) within the areas targeted by this assay, and inadequate number of viral copies(<138 copies/mL). A negative result must be combined with clinical observations, patient history, and epidemiological information. The expected result is Negative.  Fact Sheet for Patients:  EntrepreneurPulse.com.au  Fact Sheet for Healthcare Providers:  IncredibleEmployment.be  This test is no t yet approved or cleared by the [place] FDA and  has been authorized for detection and/or diagnosis of SARS-CoV-2 by FDA under an Emergency Use Authorization (EUA). This EUA will remain  in effect (meaning this test can be used) for the duration of the COVID-19 declaration under Section 564(b)(1) of the Act, 21 U.S.C.section 360bbb-3(b)(1), unless the authorization is terminated  or revoked sooner.       Influenza A by PCR NEGATIVE NEGATIVE Final   Influenza B by PCR NEGATIVE NEGATIVE Final    Comment: (NOTE) The Xpert Xpress  SARS-CoV-2/FLU/RSV plus assay is intended as an aid in the diagnosis of influenza from Nasopharyngeal swab specimens and should not be used as a sole basis for treatment. Nasal washings and aspirates are unacceptable for Xpert Xpress SARS-CoV-2/FLU/RSV testing.  Fact Sheet for Patients: EntrepreneurPulse.com.au  Fact Sheet for Healthcare Providers: IncredibleEmployment.be  This test is not yet approved or cleared by the [place] FDA and has been authorized for detection and/or diagnosis of SARS-CoV-2 by FDA under an Emergency Use Authorization (EUA). This EUA will remain in effect (meaning this test can be used) for the duration of the COVID-19 declaration under Section 564(b)(1) of the Act, 21 U.S.C. section 360bbb-3(b)(1), unless the authorization is terminated or revoked.  Performed at [facility], [place] [address], [place], [place] [address]   Blood culture (routine x 2)     Status: None   Collection Time: 04/14/21 10:30 AM   Specimen: BLOOD  Result Value Ref Range Status   Specimen Description BLOOD RIGHT ANTECUBITAL  Final   Special Requests   Final    BOTTLES DRAWN AEROBIC AND ANAEROBIC Blood Culture adequate volume   Culture   Final    NO GROWTH 5 DAYS Performed at [facility], [place] [address] [place], [place] [address]    Report Status 04/19/2021 FINAL  Final  Blood culture (routine x 2)     Status: None   Collection Time: 04/14/21 10:35 AM   Specimen:  BLOOD RIGHT HAND  Result Value Ref Range Status   Specimen Description BLOOD RIGHT HAND  Final   Special Requests   Final    BOTTLES DRAWN AEROBIC AND ANAEROBIC Blood Culture adequate volume   Culture   Final    NO GROWTH 5 DAYS Performed at [facility], [address], [place], [place] [address]    Report Status 04/19/2021 FINAL  Final  Surgical pcr screen     Status: Abnormal   Collection Time: 04/15/21  4:48 PM   Specimen: Nasal Mucosa; Nasal Swab   Result Value Ref Range Status   MRSA, PCR NEGATIVE NEGATIVE Final   Staphylococcus aureus POSITIVE (A) NEGATIVE Final    Comment: (NOTE) The Xpert SA Assay (FDA approved for NASAL specimens in patients 57 years of age and older), is one component of a comprehensive surveillance program. It is not intended to diagnose infection nor to guide or monitor treatment. Performed at [facility], [place] [address], [place], [place] [address]      Labs:  COVID-19 Labs  No results for input(s): DDIMER, FERRITIN, LDH, CRP in the last 72 hours.  Lab Results  Component Value Date   SARSCOV2NAA NEGATIVE 04/14/2021   [place] NEGATIVE 03/24/2021   [place] NEGATIVE 02/23/2021   [place] NEGATIVE 02/08/2021      Basic Metabolic Panel: Recent Labs  Lab 04/17/21 0334 04/18/21 1206 04/19/21 0236 04/23/21 0347  NA 131* 135 136 133*  K 6.0* 3.9 4.1 4.0  CL 96* 97* 99 98  CO2 $Re'24 28 27 22  'LPn$ GLUCOSE 159* 132* 158* 163*  BUN 27* 23 32* 57*  CREATININE 8.43* 7.03* 8.31* 11.98*  CALCIUM 8.4* 8.5* 8.3* 8.5*  PHOS 3.6 2.5 3.1 5.4*   Liver Function Tests: Recent Labs  Lab 04/17/21 0334 04/18/21 1206 04/19/21 0236 04/23/21 0347  ALBUMIN 2.2* 2.5* 2.1* 2.2*    CBC: Recent Labs  Lab 04/17/21 0334 04/18/21 0345 04/19/21 0236 04/20/21 0236 04/21/21 0500 04/22/21 0358 04/23/21 0347  WBC 11.2*   < > 5.9 6.8 7.4 6.8 6.7  NEUTROABS 8.5*  --   --   --   --   --   --   HGB 6.7*   < > 7.3* 7.0* 8.7* 8.4* 8.1*  HCT 21.2*   < > 23.1* 22.7* 28.2* 26.7* 25.2*  MCV 108.2*   < > 103.1* 105.1* 101.1* 98.2 97.7  PLT 155   < > 147* 144* 136* PLATELET CLUMPS NOTED ON SMEAR, COUNT APPEARS ADEQUATE 152   < > = values in this interval not displayed.    CBG: Recent Labs  Lab 04/22/21 1105 04/22/21 1638 04/22/21 2102 04/23/21 0645 04/23/21 1218  GLUCAP 181* 107* 133* 117* 121*     IMAGING STUDIES CT HEAD WO CONTRAST (5MM)  Result Date: 03/24/2021 CLINICAL DATA:  Seizures  with increased twitching and hallucinations. Recent medication change. EXAM: CT HEAD WITHOUT CONTRAST TECHNIQUE: Contiguous axial images were obtained from the base of the skull through the vertex without intravenous contrast. COMPARISON:  CT head 01/10/2021 FINDINGS: Brain: There is no evidence of acute intracranial hemorrhage, mass lesion, brain edema or extra-axial fluid collection. Stable mild atrophy with prominence of the ventricles and subarachnoid spaces. Stable mild chronic small vessel ischemic changes in the periventricular white matter. There is no CT evidence of acute cortical infarction. Vascular: Prominent intracranial vascular calcifications. No hyperdense vessel identified. Skull: Negative for fracture or focal lesion. Sinuses/Orbits: The visualized paranasal sinuses and mastoid air cells are  clear. No orbital abnormalities are seen. Other: None. IMPRESSION: Stable head CT.  No acute intracranial findings. Electronically Signed   By: Richardean Sale M.D.   On: 03/24/2021 15:41   PERIPHERAL VASCULAR CATHETERIZATION  Result Date: 04/08/2021 Images from the original result were not included. 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 Donzetta Matters, MD Procedure Performed: 1.  Ultrasound-guided cannulation right common femoral artery 2.  Aortogram with bilateral lower extremity runoff 3.  Mynx device closure right common femoral artery 4.  Moderate sedation with fentanyl and Versed for 29 minutes Indications: 60 year old male with history of end-stage renal disease currently on dialysis via fistula.  He is indicated for aortogram with bilateral lower extremity runoff due to chronic limb threatening ischemia with left lower extremity ulceration. Findings: The aortoiliac segments are free of  flow-limiting stenosis.  Bilateral renal arteries do not appear to fill the kidneys consistent with dialysis access.  On the left side there is calcification of the left SFA does not appear to be flow-limiting.  Below the knee the peroneal artery is very large runs off to the ankle where it feeds a posterior tibial artery which is diminutive.  The posterior tibial artery is occluded for a very long segment.  The anterior tibial artery is patent toe the mid leg and then becomes sclerotic does not proceed onto the foot.  On the right side he has calcification of the SFA 1 area with a large calcified segment that appears to obscure flow but does not appear to impede flow.  Below the knee appears to have posterior tibial artery runoff to the foot.  Patient does not appear to have any endovascular or open surgical options to improve flow into the left foot. Will follow patient up to evaluate his wound possibly would need transmetatarsal amputation versus below-knee amputation.  Procedure:  The patient was identified in the holding area and taken to room 8.  The patient was then placed supine on the table and prepped and draped in the usual sterile fashion.  A time out was called.  Ultrasound was used to evaluate the right common femoral artery.  This was calcified however free of flow-limiting stenosis.  There is anesthetized 1% lidocaine cannulated with direct ultrasound visualization with micropuncture needle followed by wire sheath.  Images saved in permanent record.  Bentson wires placed followed by 5 [place] sheath.  We performed aortogram followed by bilateral extremity runoff.  With the above findings I elected no intervention.  Catheter was removed over wire.  I performed retrograde angiogram of the right common femoral artery.  I deployed a minx device which supplied well.  He tolerated procedure without any complication Contrast: 99JT Brandon C. Donzetta Matters, MD [facility] Office:  [phone] Pager: [phone]   DG Foot Complete Left  Result Date: 04/14/2021 CLINICAL DATA:  Infection. Pain in bilateral feet. Wound on the top of the left foot. EXAM: LEFT FOOT - COMPLETE 3+ VIEW COMPARISON:  None. FINDINGS: Fractures are seen through the bases of the third, fourth, and fifth proximal phalanges. No evidence of osteomyelitis. No other abnormalities. IMPRESSION: There are fractures through the bases of the third, fourth, and fifth proximal phalanges, unchanged since March 30, 2021. No osteomyelitis identified. Electronically Signed   By: Dorise Bullion III M.D.   On: 04/14/2021 10:03   DG  Foot Complete Left  Result Date: 03/30/2021 CLINICAL DATA:  Pain, diabetic wound EXAM: LEFT FOOT - COMPLETE 3+ VIEW COMPARISON:  None. FINDINGS: Plate fixation of the calcaneus. Extensive vascular arterial calcifications. No osseous erosionto suggest osteomyelitis. No soft tissue swelling or subcutaneous gas. IMPRESSION: No radiographic evidence of osteomyelitis. Electronically Signed   By: Suzy Bouchard M.D.   On: 03/30/2021 13:14   DG Foot Complete Right  Result Date: 03/30/2021 CLINICAL DATA:  Pain, diabetic, wound. EXAM: RIGHT FOOT COMPLETE - 3+ VIEW COMPARISON:  None. FINDINGS: Extensive vascular calcifications throughout the ankle and foot. Large plantar calcaneal spur. Sclerotic structure in the distal tibia could represent a bone infarct. Negative for fracture or dislocation. No evidence for bone destruction or periosteal reaction. Wound site is not known. IMPRESSION: 1. No acute bone abnormality to the right foot. 2. Calcaneal spur. 3. 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 Electronically Signed   By: Markus Daft M.D.   On: 03/30/2021 13:15   VAS [place] LOWER EXTREMITY VENOUS (DVT) (7a-7p)  Result Date: 04/14/2021  Lower Venous DVT Study Patient Name:  Daniel [name]  Date of Exam:   04/14/2021 Medical Rec #: [id number]           Accession #:    [id number] Date of Birth:  [birth year]           Patient Gender: M Patient Age:   86 years Exam [facility] Procedure:      VAS [place] LOWER EXTREMITY VENOUS (DVT) Referring Phys: Ova Freshwater FONDAW --------------------------------------------------------------------------------  Indications: Edema. Other Indications: Non healing wound left foot. Risk Factors: Patient with rest pain and no options to improve flow to the left foot. Limitations: Involuntary movement secondary to rest pain in left foot. Comparison Study: Prior negative bilateral LEVs done 03/19/21 and 03/25/21 Performing Technologist: Sharion Dove RVS  Examination Guidelines: A complete evaluation includes B-mode imaging, spectral Doppler, color Doppler, and power Doppler as needed of all accessible portions of each vessel. Bilateral testing is considered an integral part of a complete examination. Limited examinations for reoccurring indications may be performed as noted. The reflux portion of the exam is performed with the patient in reverse Trendelenburg.  +-----+---------------+---------+-----------+----------+--------------+  RIGHT Compressibility Phasicity Spontaneity Properties Thrombus Aging  +-----+---------------+---------+-----------+----------+--------------+  CFV   Full            Yes       Yes                                    +-----+---------------+---------+-----------+----------+--------------+   +---------+---------------+---------+-----------+----------+--------------+  LEFT      Compressibility Phasicity Spontaneity Properties Thrombus Aging  +---------+---------------+---------+-----------+----------+--------------+  CFV       Full            Yes       Yes                                    +---------+---------------+---------+-----------+----------+--------------+  SFJ       Full                                                             +---------+---------------+---------+-----------+----------+--------------+  FV Prox   Full                                                              +---------+---------------+---------+-----------+----------+--------------+  FV Mid    Full                                                             +---------+---------------+---------+-----------+----------+--------------+  FV Distal Full                                                             +---------+---------------+---------+-----------+----------+--------------+  PFV       Full                                                             +---------+---------------+---------+-----------+----------+--------------+  POP       Full            Yes       Yes                                    +---------+---------------+---------+-----------+----------+--------------+  PTV       Full            Yes       Yes                                    +---------+---------------+---------+-----------+----------+--------------+     Summary: RIGHT: - No evidence of common femoral vein obstruction.  LEFT: - There is no evidence of deep vein thrombosis in the lower extremity.  Interstitial edema noted throughout the left calf.  *See table(s) above for measurements and observations. Electronically signed by Orlie Pollen on 04/14/2021 at 12:05:45 PM.    Final    VAS [place] LOWER EXTREMITY VENOUS (DVT)  Result Date: 03/25/2021  Lower Venous DVT Study Patient Name:  Daniel [name]  Date of Exam:   03/25/2021 Medical Rec #: [id number]           Accession #:    [id number] Date of Birth: 1960/05/19           Patient Gender: M Patient Age:   76 years Exam Location[facility] Procedure:      VAS [place] LOWER EXTREMITY VENOUS (DVT) Referring Phys: Vicente Serene WOODS --------------------------------------------------------------------------------  Indications: Pain.  Comparison Study: prior 03/19/21 Performing Technologist: Archie Patten RVS  Examination Guidelines: A complete evaluation includes B-mode imaging, spectral Doppler, color Doppler, and power Doppler as needed of all  accessible portions of each vessel. Bilateral testing is considered an integral part  of a complete examination. Limited examinations for reoccurring indications may be performed as noted. The reflux portion of the exam is performed with the patient in reverse Trendelenburg.  +---------+---------------+---------+-----------+----------+--------------+  RIGHT     Compressibility Phasicity Spontaneity Properties Thrombus Aging  +---------+---------------+---------+-----------+----------+--------------+  CFV       Full            Yes       Yes                                    +---------+---------------+---------+-----------+----------+--------------+  SFJ       Full                                                             +---------+---------------+---------+-----------+----------+--------------+  FV Prox   Full                                                             +---------+---------------+---------+-----------+----------+--------------+  FV Mid    Full                                                             +---------+---------------+---------+-----------+----------+--------------+  FV Distal Full                                                             +---------+---------------+---------+-----------+----------+--------------+  PFV       Full                                                             +---------+---------------+---------+-----------+----------+--------------+  POP       Full            Yes       Yes                                    +---------+---------------+---------+-----------+----------+--------------+  PTV       Full                                                             +---------+---------------+---------+-----------+----------+--------------+  PERO      Full                                                             +---------+---------------+---------+-----------+----------+--------------+   +---------+---------------+---------+-----------+----------+--------------+  LEFT      Compressibility Phasicity Spontaneity Properties Thrombus Aging  +---------+---------------+---------+-----------+----------+--------------+  CFV       Full            Yes       Yes                                    +---------+---------------+---------+-----------+----------+--------------+  SFJ       Full                                                             +---------+---------------+---------+-----------+----------+--------------+  FV Prox   Full                                                             +---------+---------------+---------+-----------+----------+--------------+  FV Mid    Full                                                             +---------+---------------+---------+-----------+----------+--------------+  FV Distal Full                                                             +---------+---------------+---------+-----------+----------+--------------+  PFV       Full                                                             +---------+---------------+---------+-----------+----------+--------------+  POP       Full            Yes       Yes                                    +---------+---------------+---------+-----------+----------+--------------+  PTV       Full                                                             +---------+---------------+---------+-----------+----------+--------------+  PERO      Full                                                             +---------+---------------+---------+-----------+----------+--------------+  Summary: BILATERAL: - No evidence of deep vein thrombosis seen in the lower extremities, bilaterally. -No evidence of popliteal cyst, bilaterally.   *See table(s) above for measurements and observations. Electronically signed by Monica Martinez MD on 03/25/2021 at 4:44:01 PM.    Final    HYBRID OR IMAGING ([place])  Result Date: 04/19/2021 There is no interpretation for this exam.  This order is for images obtained  during a surgical procedure.  Please See "Surgeries" Tab for more information regarding the procedure.    DISCHARGE EXAMINATION: Vitals:   04/23/21 1100 04/23/21 1130 04/23/21 1134 04/23/21 1140  BP: 120/72 123/80 103/65 115/76  Pulse: 86 69 80 85  Resp:    18  Temp:    98 F (36.7 C)  TempSrc:    Oral  SpO2:    100%  Weight:    128.4 kg  Height:       General appearance: Awake alert.  In no distress Resp: Clear to auscultation bilaterally.  Normal effort Cardio: S1-S2 is normal regular.  No S3-S4.  No rubs murmurs or bruit GI: Abdomen is soft.  Nontender nondistended.  Bowel sounds are present normal.  No masses organomegaly   DISPOSITION: SNF  Discharge Instructions     Call MD for:  difficulty breathing, headache or visual disturbances   Complete by: As directed    Call MD for:  extreme fatigue   Complete by: As directed    Call MD for:  persistant dizziness or light-headedness   Complete by: As directed    Call MD for:  persistant nausea and vomiting   Complete by: As directed    Call MD for:  severe uncontrolled pain   Complete by: As directed    Call MD for:  temperature >100.4   Complete by: As directed    Diet - low sodium heart healthy   Complete by: As directed    Discharge instructions   Complete by: As directed    Please review instructions on the discharge summary.  You were cared for by a hospitalist during your hospital stay. If you have any questions about your discharge medications or the care you received while you were in the hospital after you are discharged, you can call the unit and asked to speak with the hospitalist on call if the hospitalist that took care of you is not available. Once you are discharged, your primary care physician will handle any further medical issues. Please note that NO REFILLS for any discharge medications will be authorized once you are discharged, as it is imperative that you return to your primary care physician (or  establish a relationship with a primary care physician if you do not have one) for your aftercare needs so that they can reassess your need for medications and monitor your lab values. If you do not have a primary care physician, you can call [phone] for a physician referral.   Discharge wound care:   Complete by: As directed    As per vascular surgery   Increase activity slowly   Complete by: As directed           Allergies as of 04/23/2021       Reactions   Seroquel [quetiapine] Other (See Comments)   Tardive kinesia/dystonia   Dilaudid [hydromorphone Hcl] Itching, Other (See Comments)   Pt reports itchiness after IM injection         Medication List     TAKE these medications    (feeding  supplement) PROSource Plus liquid Take 30 mLs by mouth 3 (three) times daily between meals.   amLODipine 10 MG tablet Commonly known as: NORVASC Take 1 tablet (10 mg total) by mouth at bedtime.   blood glucose meter kit and supplies Kit Dispense based on patient and insurance preference. Use up to four times daily as directed. (FOR ICD-9 250.00, 250.01).   calcium acetate 667 MG capsule Commonly known as: PHOSLO Take 1,334 mg by mouth 3 (three) times daily with meals.   cloNIDine 0.1 MG tablet Commonly known as: CATAPRES Take 0.1 mg by mouth as directed. TAKE 1 TABLET BID BUT ON DIALYSIS DAYS TAKE ONLY AT BEDTIME.   gabapentin 100 MG capsule Commonly known as: NEURONTIN Take 1 capsule (100 mg total) by mouth 3 (three) times daily.   HYDROcodone-acetaminophen 5-325 MG tablet Commonly known as: NORCO/VICODIN Take 1 tablet by mouth every 6 (six) hours as needed for moderate pain.   hydrOXYzine 25 MG tablet Commonly known as: ATARAX TAKE 1 TABLET BY MOUTH EVERY 8 HOURS AS NEEDED FOR ANXIETY OR  ITCHING Strength: 25 mg What changed: See the new instructions.   lidocaine-prilocaine cream Commonly known as: EMLA Apply 1 application topically once.   loperamide 2 MG  capsule Commonly known as: IMODIUM Take 2 capsules (4 mg total) by mouth 3 (three) times daily as needed for diarrhea or loose stools.   multivitamin Tabs tablet Take 1 tablet by mouth at bedtime.   nicotine 14 mg/24hr patch Commonly known as: NICODERM CQ - dosed in mg/24 hours Place 1 patch (14 mg total) onto the skin daily.   pantoprazole 40 MG tablet Commonly known as: PROTONIX Take 1 tablet (40 mg total) by mouth 2 (two) times daily.   saccharomyces boulardii 250 MG capsule Commonly known as: FLORASTOR Take 1 capsule (250 mg total) by mouth 2 (two) times daily.               Discharge Care Instructions  (From admission, onward)           Start     Ordered   04/23/21 0000  Discharge wound care:       Comments: As per vascular surgery   04/23/21 1235              Follow-up Information     [facility] Follow up in 2 week(s).   Specialty: Vascular Surgery Why: Office will call you to arrange your appt (sent) Contact information: [place]: 62 minutes  Bonnielee Haff  [facility] Pager on [email or web address]  04/23/2021, 12:36 PM

## 2021-04-23 NOTE — Progress Notes (Addendum)
°  Progress Note    04/23/2021 8:13 AM 4 Days Post-Op  Subjective:  says he has some pain in his leg this morning  afebrile  Vitals:   04/23/21 0753 04/23/21 0800  BP: 140/72 136/73  Pulse: 81 83  Resp:    Temp:    SpO2:      Physical Exam: General:  no distress Lungs:  non labored Incisions:  left arm incision is clean and healing nicely Extremities:  +thrill left arm AVF; bandage on left BKA stump and is clean and dry.  CBC    Component Value Date/Time   WBC 6.7 04/23/2021 0347   RBC 2.58 (L) 04/23/2021 0347   HGB 8.1 (L) 04/23/2021 0347   HGB 8.4 (L) 08/22/2020 1141   HCT 25.2 (L) 04/23/2021 0347   HCT 25.0 (L) 08/22/2020 1141   PLT 152 04/23/2021 0347   PLT 184 08/22/2020 1141   MCV 97.7 04/23/2021 0347   MCV 94 08/22/2020 1141   MCH 31.4 04/23/2021 0347   MCHC 32.1 04/23/2021 0347   RDW 23.7 (H) 04/23/2021 0347   RDW 16.7 (H) 08/22/2020 1141   LYMPHSABS 1.6 04/17/2021 0334   LYMPHSABS 1.8 08/22/2020 1141   MONOABS 1.0 04/17/2021 0334   EOSABS 0.0 04/17/2021 0334   EOSABS 0.3 08/22/2020 1141   BASOSABS 0.0 04/17/2021 0334   BASOSABS 0.0 08/22/2020 1141    BMET    Component Value Date/Time   NA 133 (L) 04/23/2021 0347   NA 138 08/22/2020 1141   K 4.0 04/23/2021 0347   CL 98 04/23/2021 0347   CO2 22 04/23/2021 0347   GLUCOSE 163 (H) 04/23/2021 0347   BUN 57 (H) 04/23/2021 0347   BUN 20 08/22/2020 1141   CREATININE 11.98 (H) 04/23/2021 0347   CALCIUM 8.5 (L) 04/23/2021 0347   CALCIUM 7.9 (L) 08/01/2019 0702   GFRNONAA 4 (L) 04/23/2021 0347   GFRAA 7 (L) 12/28/2019 1640    INR    Component Value Date/Time   INR 1.0 03/24/2021 1831     Intake/Output Summary (Last 24 hours) at 04/23/2021 0813 Last data filed at 04/22/2021 1100 Gross per 24 hour  Intake 330 ml  Output --  Net 330 ml     Assessment/Plan:  60 y.o. 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  Will take this off tomorrow to inspect incision.  Pt will f/u in a couple of weeks to have staples removed.  Our office will arrange appt.  -left arm incision healing nicely. +thrill in fistula   Leontine Locket, PA-C [facility] [phone] 04/23/2021 8:13 AM  Dressing changed at bedside 04/23/21.

## 2021-04-23 NOTE — Progress Notes (Signed)
[facility] Progress Note   Subjective:    Patient seen and examined on HD. No complaints. Denies SOB, CP, and N/V. Patient wants to go home.  Objective Vitals:   04/23/21 1100 04/23/21 1130 04/23/21 1134 04/23/21 1140  BP: 120/72 123/80 103/65 115/76  Pulse: 86 69 80 85  Resp:    18  Temp:    98 F (36.7 C)  TempSrc:    Oral  SpO2:    100%  Weight:    128.4 kg  Height:       Physical Exam General: Older male; NAD Heart: Normal S1 and S2; No murmurs, gallops, or rubs Lungs: Clear throughout; No wheezing, rales, or rhonchi Abdomen: Large, soft, and non-tender Extremities: L BKA-wrapped with ace wrap; No edema RLE Dialysis Access: L AVF-surgical glue intact; RIJ [facility]   Filed Weights   04/20/21 1130 04/23/21 0735 04/23/21 1140  Weight: 126 kg 130.3 kg 128.4 kg    Intake/Output Summary (Last 24 hours) at 04/23/2021 1608 Last data filed at 04/23/2021 1140 Gross per 24 hour  Intake --  Output 2000 ml  Net -2000 ml    Additional Objective Labs: Basic Metabolic Panel: Recent Labs  Lab 04/18/21 1206 04/19/21 0236 04/23/21 0347  NA 135 136 133*  K 3.9 4.1 4.0  CL 97* 99 98  CO2 28 27 22   GLUCOSE 132* 158* 163*  BUN 23 32* 57*  CREATININE 7.03* 8.31* 11.98*  CALCIUM 8.5* 8.3* 8.5*  PHOS 2.5 3.1 5.4*   Liver Function Tests: Recent Labs  Lab 04/18/21 1206 04/19/21 0236 04/23/21 0347  ALBUMIN 2.5* 2.1* 2.2*   No results for input(s): LIPASE, AMYLASE in the last 168 hours. CBC: Recent Labs  Lab 04/17/21 0334 04/18/21 0345 04/19/21 0236 04/20/21 0236 04/21/21 0500 04/22/21 0358 04/23/21 0347  WBC 11.2*   < > 5.9 6.8 7.4 6.8 6.7  NEUTROABS 8.5*  --   --   --   --   --   --   HGB 6.7*   < > 7.3* 7.0* 8.7* 8.4* 8.1*  HCT 21.2*   < > 23.1* 22.7* 28.2* 26.7* 25.2*  MCV 108.2*   < > 103.1* 105.1* 101.1* 98.2 97.7  PLT 155   < > 147* 144* 136* PLATELET CLUMPS NOTED ON SMEAR, COUNT APPEARS ADEQUATE 152   < > = values in this interval not  displayed.   Blood Culture    Component Value Date/Time   SDES BLOOD RIGHT HAND 04/14/2021 1035   SPECREQUEST  04/14/2021 1035    BOTTLES DRAWN AEROBIC AND ANAEROBIC Blood Culture adequate volume   CULT  04/14/2021 1035    NO GROWTH 5 DAYS Performed at [facility], [place] [address] [place], [place] [address]    REPTSTATUS 04/19/2021 FINAL 04/14/2021 1035    Cardiac Enzymes: No results for input(s): CKTOTAL, CKMB, CKMBINDEX, TROPONINI in the last 168 hours. CBG: Recent Labs  Lab 04/22/21 1638 04/22/21 2102 04/23/21 0645 04/23/21 1218 04/23/21 1531  GLUCAP 107* 133* 117* 121* 96   Iron Studies: No results for input(s): IRON, TIBC, TRANSFERRIN, FERRITIN in the last 72 hours. Lab Results  Component Value Date   INR 1.0 03/24/2021   INR 1.1 10/28/2020   INR 1.1 10/27/2020   Studies/Results: No results found.  Medications:   (feeding supplement) PROSource Plus  30 mL Oral TID BM   sodium chloride   Intravenous Once   acetaminophen  650 mg Oral Once   amLODipine  10 mg  Oral QHS   calcium acetate  1,334 mg Oral TID WC   Chlorhexidine Gluconate Cloth  6 each Topical Q0600   cloNIDine  0.1 mg Oral Once per day on Tue Thu Sat   cloNIDine  0.1 mg Oral 2 times per day on Sun Mon Wed Fri   diphenhydrAMINE  25 mg Intravenous Once   gabapentin  100 mg Oral TID   heparin  5,000 Units Subcutaneous Q8H   insulin aspart  0-6 Units Subcutaneous TID WC   multivitamin  1 tablet Oral QHS   nicotine  14 mg Transdermal Daily   pantoprazole  40 mg Oral BID   saccharomyces boulardii  250 mg Oral BID    Dialysis Orders: TTS - [place] 4hrs, BFR 450, DFR 500,  [id number], 2K/ 2Ca Mircera 225 mcg IV q2wks - last 04/06/21 No Systemic Heparin Hectorol 61mcg IV with HD-04/13/21 Venofer 100mg  IV X 6 doses-last 04/13/21   Assessment/Plan: Diabetic L foot infection/PVD-Underwent LE angiography 04/08/21 showed limb ischemia L leg. 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 Scot Dock.  Currently off abx now. Per primary service Aneurysmal AVF: Went for plication of AVF and placement of TDC on 12/23 by Dr. Scot Dock Constipation: per primary service ESRD - T,Th,S -HD off schedule earlier this week. HD today.  Hypertension/volume  - Patient euvolemic on exam. Bps stable. Continue current anti-hypertensive regimen, UF as tolerated. Will need lower EDW post BKA on discharge. post hd edw 12/24 126kg-challenge further Anemia of CKD - S/P 2 units PRBCs 04/17/21. S/p transfusion w/ hd yesterday, hgb 8.1 today Secondary Hyperparathyroidism - Ca and PO4 ok Nutrition - renal diet with fluid restrictions. Add protein supps Disposition: Okay for discharge from renal standpoint  Tobie Poet, NP [facility] 04/23/2021,4:08 PM  LOS: 9 days

## 2021-04-23 NOTE — Progress Notes (Signed)
°   04/23/21 1140  Vitals  Temp 98 F (36.7 C)  Temp Source Oral  BP 115/76  BP Location Right Leg  BP Method Automatic  Patient Position (if appropriate) Lying  Pulse Rate 85  Pulse Rate Source Monitor  Resp 18  Oxygen Therapy  SpO2 100 %  O2 Device Room Air  Dialysis Weight  Weight 128.4 kg  Type of Weight Post-Dialysis  Post-Hemodialysis Assessment  Rinseback Volume (mL) 250 mL  KECN 268 V  Dialyzer Clearance Lightly streaked  Duration of HD Treatment -hour(s) 3.5 hour(s)  Hemodialysis Intake (mL) 500 mL  UF Total -Machine (mL) 2500 mL  Net UF (mL) 2000 mL  Tolerated HD Treatment Yes  Post-Hemodialysis Comments tx complete-pt stable  Hemodialysis Catheter Right Internal jugular Double lumen Permanent (Tunneled)  Placement Date/Time: 04/19/21 1037   Placed prior to admission: No  Time Out: Correct patient;Correct site;Correct procedure  Maximum sterile barrier precautions: Sterile gown;Mask;Large sterile sheet;Cap;Hand hygiene;Sterile gloves  Site Prep: Chlorh...  Site Condition No complications  Blue Lumen Status Flushed;Dead end cap in place;Heparin locked  Red Lumen Status Flushed;Heparin locked;Dead end cap in place  Purple Lumen Status N/A  Catheter fill solution Heparin 1000 units/ml  Catheter fill volume (Arterial) 1.9 cc  Catheter fill volume (Venous) 1.9  Dressing Type Transparent  Dressing Status Clean;Dry;Intact  Antimicrobial disc in place? Yes  Interventions  (assessed)  Drainage Description None  Post treatment catheter status Capped and Clamped   HD tx complete, pt stable. No problems noted throughout tx.

## 2021-04-24 LAB — GLUCOSE, CAPILLARY
Glucose-Capillary: 101 mg/dL — ABNORMAL HIGH (ref 70–99)
Glucose-Capillary: 103 mg/dL — ABNORMAL HIGH (ref 70–99)
Glucose-Capillary: 144 mg/dL — ABNORMAL HIGH (ref 70–99)

## 2021-04-24 MED ORDER — HEPARIN SODIUM (PORCINE) 1000 UNIT/ML DIALYSIS
1000.0000 [IU] | INTRAMUSCULAR | Status: DC | PRN
  Filled 2021-04-24: qty 1

## 2021-04-24 MED ORDER — MUPIROCIN 2 % EX OINT
TOPICAL_OINTMENT | Freq: Two times a day (BID) | CUTANEOUS | Status: DC
  Filled 2021-04-24 (×3): qty 22

## 2021-04-24 MED ORDER — SODIUM CHLORIDE 0.9 % IV SOLN: 100.0000 mL | INTRAVENOUS | Status: DC | PRN

## 2021-04-24 MED ORDER — PENTAFLUOROPROP-TETRAFLUOROETH EX AERO: 1.0000 "application " | INHALATION_SPRAY | CUTANEOUS | Status: DC | PRN

## 2021-04-24 MED ORDER — ALTEPLASE 2 MG IJ SOLR: 2.0000 mg | Freq: Once | INTRAMUSCULAR | Status: DC | PRN

## 2021-04-24 MED ORDER — LIDOCAINE HCL (PF) 1 % IJ SOLN: 5.0000 mL | INTRAMUSCULAR | Status: DC | PRN

## 2021-04-24 MED ORDER — LIDOCAINE-PRILOCAINE 2.5-2.5 % EX CREA: 1.0000 "application " | TOPICAL_CREAM | CUTANEOUS | Status: DC | PRN

## 2021-04-24 NOTE — TOC Progression Note (Signed)
Transition of Care [facility]) - Progression Note    Patient Details  Name: Daniel [name] MRN: [id number] Date of Birth: 11/29/1960  Transition of Care [facility]) CM/SW [place], [place] Phone Number: 04/24/2021, 12:32 PM  Clinical Narrative:    MD noted pt was medically stable to DC today, discharge initiated. CSW received message from renal navigator wanting to confirm facility was aware of pt's HD schedule AF on TTS. arrives at 6:45 for 7:10 chair time. Facility noted that they were told this would be switched as they can only transport on MWF. CSW notified Renal Navigator who stated she was not aware of this change, but will try to accommodate it. Renal navigator switched chair time to MWF Arrival time 10:30 for 11:00 chair. However with DC today, pt would be short a dialysis session so the discharge plan is now to go to HD in the morning and then to discharge to SNF. Facility states covid test is still good for tomorrow. TOC will continue to follow for DC needs.  Expected Discharge Plan: Skilled Nursing Facility Barriers to Discharge: SNF Pending bed offer, Continued Medical Work up  Expected Discharge Plan and Services Expected Discharge Plan: [place] Choice: [place] arrangements for the past 2 months: [place] Expected Discharge Date: 04/23/21                                     Social Determinants of Health (SDOH) Interventions    Readmission Risk Interventions Readmission Risk Prevention Plan 02/27/2021 07/07/2019  Transportation Screening Complete Complete  PCP or Specialist Appt within 3-5 Days - Complete  HRI or [place] - Complete  Social Work Consult for [place] Planning/Counseling - Complete  Palliative Care Screening - Not Applicable  Medication Review Press photographer) Complete Complete  PCP or Specialist appointment within 3-5 days of discharge Complete -  [place] or  Home Care Consult Complete -  SW Recovery Care/Counseling Consult Complete -  Palliative Care Screening Not Applicable -  [place] Not Applicable -  Some recent data might be hidden

## 2021-04-24 NOTE — Progress Notes (Signed)
Contacted CSW this am to confirm snf is aware of pt's out-pt HD clinic/days/times. SNF is requesting a MWF schedule. Contacted [place] SW and inquired about schedule change. Clinic can change pt's schedule  to a MWF schedule. Pt will need to arrive at 10:30 for 11:00 chair time. Pt can start on Friday. Contacted CSW with this info. Also contacted renal NP to make providers aware of pt's schedule change at d/c. Navigator advised that pt will need to receive inpt HD tomorrow and then d/c to snf. Pt will need to receive short treatment at out-pt clinic on Friday to get on MWF schedule. CSW made aware of this info as well. Will provide update to [facility] SW as well. Will assist as needed.  Melven Sartorius Renal Navigator [phone]

## 2021-04-24 NOTE — Progress Notes (Signed)
[facility] Progress Note   Subjective:    Seen and examined patient at bedside. No complaints. Denies SOB, CP, and N/V. Tolerated yesterday's HD with net UF 2L. Patient to be discharged to SNF for rehab.  Objective Vitals:   04/23/21 1134 04/23/21 1140 04/23/21 2000 04/24/21 0724  BP: 103/65 115/76 109/72 137/70  Pulse: 80 85 90 82  Resp:  18 19 14   Temp:  98 F (36.7 C) 98.2 F (36.8 C) (!) 97.4 F (36.3 C)  TempSrc:  Oral Oral Oral  SpO2:  100% 96% 98%  Weight:  128.4 kg    Height:       Physical Exam General: Older male; NAD Heart: Normal S1 and S2; No murmurs, gallops, or rubs Lungs: Clear throughout; No wheezing, rales, or rhonchi Abdomen: Large, soft, and non-tender Extremities: L BKA-wrapped with ace wrap; No edema RLE Dialysis Access: L AVF-surgical glue intact; RIJ [facility]   Filed Weights   04/20/21 1130 04/23/21 0735 04/23/21 1140  Weight: 126 kg 130.3 kg 128.4 kg    Intake/Output Summary (Last 24 hours) at 04/24/2021 1224 Last data filed at 04/24/2021 0900 Gross per 24 hour  Intake 480 ml  Output --  Net 480 ml    Additional Objective Labs: Basic Metabolic Panel: Recent Labs  Lab 04/18/21 1206 04/19/21 0236 04/23/21 0347  NA 135 136 133*  K 3.9 4.1 4.0  CL 97* 99 98  CO2 28 27 22   GLUCOSE 132* 158* 163*  BUN 23 32* 57*  CREATININE 7.03* 8.31* 11.98*  CALCIUM 8.5* 8.3* 8.5*  PHOS 2.5 3.1 5.4*   Liver Function Tests: Recent Labs  Lab 04/18/21 1206 04/19/21 0236 04/23/21 0347  ALBUMIN 2.5* 2.1* 2.2*   No results for input(s): LIPASE, AMYLASE in the last 168 hours. CBC: Recent Labs  Lab 04/19/21 0236 04/20/21 0236 04/21/21 0500 04/22/21 0358 04/23/21 0347  WBC 5.9 6.8 7.4 6.8 6.7  HGB 7.3* 7.0* 8.7* 8.4* 8.1*  HCT 23.1* 22.7* 28.2* 26.7* 25.2*  MCV 103.1* 105.1* 101.1* 98.2 97.7  PLT 147* 144* 136* PLATELET CLUMPS NOTED ON SMEAR, COUNT APPEARS ADEQUATE 152   Blood Culture    Component Value Date/Time   SDES  BLOOD RIGHT HAND 04/14/2021 1035   SPECREQUEST  04/14/2021 1035    BOTTLES DRAWN AEROBIC AND ANAEROBIC Blood Culture adequate volume   CULT  04/14/2021 1035    NO GROWTH 5 DAYS Performed at [facility], [place] [address], [place], [place] [address]    REPTSTATUS 04/19/2021 FINAL 04/14/2021 1035    Cardiac Enzymes: No results for input(s): CKTOTAL, CKMB, CKMBINDEX, TROPONINI in the last 168 hours. CBG: Recent Labs  Lab 04/23/21 1218 04/23/21 1531 04/23/21 2038 04/24/21 0649 04/24/21 1155  GLUCAP 121* 96 126* 101* 103*   Iron Studies: No results for input(s): IRON, TIBC, TRANSFERRIN, FERRITIN in the last 72 hours. Lab Results  Component Value Date   INR 1.0 03/24/2021   INR 1.1 10/28/2020   INR 1.1 10/27/2020   Studies/Results: No results found.  Medications:   (feeding supplement) PROSource Plus  30 mL Oral TID BM   sodium chloride   Intravenous Once   acetaminophen  650 mg Oral Once   amLODipine  10 mg Oral QHS   calcium acetate  1,334 mg Oral TID WC   Chlorhexidine Gluconate Cloth  6 each Topical Q0600   cloNIDine  0.1 mg Oral Once per day on Tue Thu Sat   cloNIDine  0.1 mg Oral 2  times per day on Sun Mon Wed Fri   diphenhydrAMINE  25 mg Intravenous Once   gabapentin  100 mg Oral TID   heparin  5,000 Units Subcutaneous Q8H   insulin aspart  0-6 Units Subcutaneous TID WC   multivitamin  1 tablet Oral QHS   mupirocin ointment   Nasal BID   nicotine  14 mg Transdermal Daily   pantoprazole  40 mg Oral BID   saccharomyces boulardii  250 mg Oral BID    Dialysis Orders: TTS - [place] 4hrs, BFR 450, DFR 500,  [id number], 2K/ 2Ca Mircera 225 mcg IV q2wks - last 04/06/21 No Systemic Heparin Hectorol 43mcg IV with HD-04/13/21 Venofer 100mg  IV X 6 doses-last 04/13/21   Assessment/Plan: Diabetic L foot infection/PVD-Underwent LE angiography 04/08/21 showed limb ischemia L leg. 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 Scot Dock. Currently off abx now. 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 Scot Dock Constipation: per primary service ESRD - T,Th,S -Plan for HD 04/25/21.  Hypertension/volume  - Patient euvolemic on exam. Bps stable. Continue current anti-hypertensive regimen, UF as tolerated. Will need lower EDW post BKA on discharge.  Anemia of CKD - S/P 2 units PRBCs 04/17/21. S/p transfusion w/ hd yesterday, hgb 8.1 today Secondary Hyperparathyroidism - Ca and PO4 ok Nutrition - renal diet with fluid restrictions. Add protein supps Disposition: Patient will be discharged to SNF for rehab. Unfortunately, patient cannot be discharged today d/t SNF requiring his HD schedule be changed to MWF (transportation availability). Plan for HD 12/29 for full treatment. Patient can be discharged after tomorrow's HD. Plan for patient to receive HD again in outpatient HD center for short treatment (2.5hrs) to place him on new HD schedule. Discussed with Dr. Jonnie Finner, Renal Navigator, Hospitalist, and unit RN.  Tobie Poet, NP [facility] 04/24/2021,12:24 PM  LOS: 10 days

## 2021-04-24 NOTE — Progress Notes (Signed)
Discussed possible discharge today, education on self-management skills completed, verbalized understanding

## 2021-04-24 NOTE — Plan of Care (Signed)
°  Problem: Education: Goal: Knowledge of General Education information will improve Description: Including pain rating scale, medication(s)/side effects and non-pharmacologic comfort measures Outcome: Adequate for Discharge   Problem: Health Behavior/Discharge Planning: Goal: Ability to manage health-related needs will improve Outcome: Adequate for Discharge   Problem: Clinical Measurements: Goal: Ability to maintain clinical measurements within normal limits will improve Outcome: Adequate for Discharge Goal: Will remain free from infection Outcome: Adequate for Discharge Goal: Diagnostic test results will improve Outcome: Adequate for Discharge Goal: Respiratory complications will improve Outcome: Adequate for Discharge Goal: Cardiovascular complication will be avoided Outcome: Adequate for Discharge   Problem: Clinical Measurements: Goal: Diagnostic test results will improve Outcome: Adequate for Discharge   Problem: Clinical Measurements: Goal: Respiratory complications will improve Outcome: Adequate for Discharge   Problem: Nutrition: Goal: Adequate nutrition will be maintained Outcome: Adequate for Discharge   Problem: Coping: Goal: Level of anxiety will decrease Outcome: Adequate for Discharge   Problem: Pain Managment: Goal: General experience of comfort will improve Outcome: Adequate for Discharge   Problem: Safety: Goal: Ability to remain free from injury will improve Outcome: Adequate for Discharge

## 2021-04-24 NOTE — Care Management Important Message (Signed)
Important Message  Patient Details  Name: BENJAMAN ARTMAN MRN: [id number] Date of Birth: [birth year]   Medicare Important Message Given:  Yes     Hannah Beat 04/24/2021, 11:19 AM

## 2021-04-24 NOTE — Discharge Summary (Addendum)
[facility]  Physician Discharge Summary   Patient ID: [name] MRN: [id number] DOB/AGE: [birth year] 60 y.o.  Admit date: 04/14/2021 Discharge date:   04/24/2021   PCP: [name], NP   No significant subject on discharge summary dictated 04/23/2021 by Dr. Bonnielee Haff, patient is medically clear for discharge awaiting SNF bed availability.  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 male with PMH of ESRD on TTS HD; DM; HTN; ICH in 2019; and  B/L LE PAD presenting with foot infection.  He was last admitted from 12/12-13 for PAD and had aortogram with cannulation of the R common femoral artery, he does not have revascularization options available and so was to f/u with Dr. Scot Dock in 1-2 weeks to discuss TMA vs. BKA now presents with worsening pain and swelling in his foot. In the ED afebrile vitals stable labs with creatinine 8.6, CRP 1.7 lactic acid 1.6, normal WBC count anemia 8.9 g, xray- are fractures through the bases of the third, fourth, and fifth proximal phalanges, unchanged since March 30, 2021 \We were consulted, placed on vancomycin,Flagyl and ceftriaxone and admitted for further management.  Consultations: Nephrology Vascular surgery  Procedures: Left below-knee amputation    HOSPITAL COURSE:   Diabetic foot infection  of the left foot/Bilateral lower extremity PVD Patient was seen by vascular surgery.  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   Patient was on antibiotics which were discontinued after his surgery.  Vascular surgery to inspect the incision site today and give further instructions regarding wound care.   Essential hypertension Stable on amlodipine and clonidine.   Diarrhea Improved with Imodium.  WBC is normal.  Abdomen is benign.  No fever.  Do not suspect infectious etiology.  Continue Imodium and probiotics.     ESRD on HD TTS/Metabolic bone disease Nephrology is following. 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   Initially dialyzed off schedule.  Now back on his usual schedule.    Anemia of renal disease Patient was transfused 2 units of PRBC with hemodialysis on 12/21.  Hemoglobin has been drifting down.  Down to 7.0.  No overt bleeding noted.  Has not had any melanotic stools or blood in the stool.  No bleeding noted from the stump area.  This could all be reflective of his kidney disease.   Transfused additional 2 units of PRBC with hemodialysis on 12/24.   Hemoglobin noted to be stable.   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   Just on SSI.  CBGs are reasonably well controlled.   Involuntary movement of upper extremities He reported occasional jerking movements of his upper extremities.  Noted to be on Neurontin which can cause tremors.  He is noted to be on Seroquel which can cause tardive dyskinesia and dystonia.  Seroquel was discontinued.  His symptoms have resolved.  We will add Seroquel to his allergy list.  Obesity Estimated body mass index is 38.74 kg/m as calculated from the following:   Height as of this encounter: _0  (1.803 m).   Weight as of this encounter: 126 kg.   Patient is stable.  Okay for discharge to  SNF today.    PERTINENT LABS:  The results of significant diagnostics from this hospitalization (including imaging, microbiology, ancillary and laboratory) are listed below for reference.    Microbiology: Recent Results (from the past 240 hour(s))  Surgical pcr screen     Status: Abnormal   Collection Time: 04/15/21  4:48 PM   Specimen: Nasal Mucosa; Nasal Swab  Result Value Ref Range Status   MRSA, PCR NEGATIVE NEGATIVE Final   Staphylococcus aureus POSITIVE (A) NEGATIVE Final    Comment: (NOTE) The Xpert SA Assay (FDA approved for NASAL specimens in patients 52 years of age and older), is one component of a comprehensive surveillance program. It is not intended to diagnose infection nor to guide or monitor treatment. Performed at [facility], [place] [address], [place], [place] [address]   SARS CORONAVIRUS 2 (TAT 6-24 HRS) Nasopharyngeal Nasopharyngeal Swab     Status: None   Collection Time: 04/22/21 11:45 AM   Specimen: Nasopharyngeal Swab  Result Value Ref Range Status   SARS Coronavirus 2 NEGATIVE NEGATIVE Final    Comment: (NOTE) SARS-CoV-2 target nucleic acids are NOT DETECTED.  The SARS-CoV-2 RNA is generally detectable in upper and lower respiratory specimens during the acute phase of infection. Negative results do not preclude SARS-CoV-2 infection, do not rule out co-infections with other pathogens, and should not be used as the sole basis for treatment or other patient management decisions. Negative results must be combined with clinical observations, patient history, and epidemiological information. The expected result is Negative.  Fact Sheet for Patients: SugarRoll.be  Fact Sheet for Healthcare Providers: https://www.woods-mathews.com/  This test is not yet approved or cleared by the [place] FDA and  has been authorized for detection and/or diagnosis of SARS-CoV-2 by FDA under an Emergency Use  Authorization (EUA). This EUA will remain  in effect (meaning this test can be used) for the duration of the COVID-19 declaration under Se ction 564(b)(1) of the Act, 21 U.S.C. section 360bbb-3(b)(1), unless the authorization is terminated or revoked sooner.  Performed at [facility], [place] [address], [place], [place] [address]      Labs:  COVID-19 Labs  No results for input(s): DDIMER, FERRITIN, LDH, CRP in the last 72 hours.  Lab Results  Component Value Date   SARSCOV2NAA NEGATIVE 04/22/2021   [place] NEGATIVE 04/14/2021   [place] NEGATIVE 03/24/2021   [place] NEGATIVE 02/23/2021      Basic Metabolic Panel: Recent Labs  Lab 04/18/21 1206 04/19/21 0236 04/23/21 0347  NA 135 136 133*  K 3.9 4.1 4.0  CL 97* 99 98  CO2 _1 GLUCOSE 132* 158* 163*  BUN 23 32* 57*  CREATININE 7.03* 8.31* 11.98*  CALCIUM 8.5* 8.3* 8.5*  PHOS 2.5 3.1 5.4*   Liver Function Tests: Recent Labs  Lab 04/18/21 1206 04/19/21 0236 04/23/21 0347  ALBUMIN 2.5* 2.1* 2.2*    CBC: Recent Labs  Lab 04/19/21 0236 04/20/21 0236 04/21/21 0500 04/22/21 0358 04/23/21 0347  WBC 5.9 6.8 7.4 6.8 6.7  HGB 7.3* 7.0* 8.7* 8.4* 8.1*  HCT 23.1* 22.7* 28.2* 26.7* 25.2*  MCV 103.1* 105.1* 101.1* 98.2 97.7  PLT 147* 144* 136* PLATELET CLUMPS NOTED ON SMEAR, COUNT APPEARS ADEQUATE 152  CBG: Recent Labs  Lab 04/23/21 0645 04/23/21 1218 04/23/21 1531 04/23/21 2038 04/24/21 0649  GLUCAP 117* 121* 96 126* 101*     IMAGING STUDIES PERIPHERAL VASCULAR CATHETERIZATION  Result Date: 04/08/2021 Images from the original result were not included. 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 Donzetta Matters, MD Procedure Performed: 1.  Ultrasound-guided cannulation right  common femoral artery 2.  Aortogram with bilateral lower extremity runoff 3.  Mynx device closure right common femoral artery 4.  Moderate sedation with fentanyl and Versed for 29 minutes Indications: 60 year old male with history of end-stage renal disease currently on dialysis via fistula.  He is indicated for aortogram with bilateral lower extremity runoff due to chronic limb threatening ischemia with left lower extremity ulceration. Findings: The aortoiliac segments are free of flow-limiting stenosis.  Bilateral renal arteries do not appear to fill the kidneys consistent with dialysis access.  On the left side there is calcification of the left SFA does not appear to be flow-limiting.  Below the knee the peroneal artery is very large runs off to the ankle where it feeds a posterior tibial artery which is diminutive.  The posterior tibial artery is occluded for a very long segment.  The anterior tibial artery is patent toe the mid leg and then becomes sclerotic does not proceed onto the foot.  On the right side he has calcification of the SFA 1 area with a large calcified segment that appears to obscure flow but does not appear to impede flow.  Below the knee appears to have posterior tibial artery runoff to the foot.  Patient does not appear to have any endovascular or open surgical options to improve flow into the left foot. Will follow patient up to evaluate his wound possibly would need transmetatarsal amputation versus below-knee amputation.  Procedure:  The patient was identified in the holding area and taken to room 8.  The patient was then placed supine on the table and prepped and draped in the usual sterile fashion.  A time out was called.  Ultrasound was used to evaluate the right common femoral artery.  This was calcified however free of flow-limiting stenosis.  There is anesthetized 1% lidocaine cannulated with direct ultrasound visualization with micropuncture needle followed by wire sheath.  Images  saved in permanent record.  Bentson wires placed followed by 5 [place] sheath.  We performed aortogram followed by bilateral extremity runoff.  With the above findings I elected no intervention.  Catheter was removed over wire.  I performed retrograde angiogram of the right common femoral artery.  I deployed a minx device which supplied well.  He tolerated procedure without any complication Contrast: 44WN Brandon C. Donzetta Matters, MD [facility] Office: [phone] Pager: [phone]   DG Foot Complete Left  Result Date: 04/14/2021 CLINICAL DATA:  Infection. Pain in bilateral feet. Wound on the top of the left foot. EXAM: LEFT FOOT - COMPLETE 3+ VIEW COMPARISON:  None. FINDINGS: Fractures are seen through the bases of the third, fourth, and fifth proximal phalanges. No evidence of osteomyelitis. No other abnormalities. IMPRESSION: There are fractures through the bases of the third, fourth, and fifth proximal phalanges, unchanged since March 30, 2021. No osteomyelitis identified. Electronically Signed   By: Dorise Bullion III M.D.   On: 04/14/2021 10:03   DG Foot Complete Left  Result  Date: 03/30/2021 CLINICAL DATA:  Pain, diabetic wound EXAM: LEFT FOOT - COMPLETE 3+ VIEW COMPARISON:  None. FINDINGS: Plate fixation of the calcaneus. Extensive vascular arterial calcifications. No osseous erosionto suggest osteomyelitis. No soft tissue swelling or subcutaneous gas. IMPRESSION: No radiographic evidence of osteomyelitis. Electronically Signed   By: Suzy Bouchard M.D.   On: 03/30/2021 13:14   DG Foot Complete Right  Result Date: 03/30/2021 CLINICAL DATA:  Pain, diabetic, wound. EXAM: RIGHT FOOT COMPLETE - 3+ VIEW COMPARISON:  None. FINDINGS: Extensive vascular calcifications throughout the ankle and foot. Large plantar calcaneal spur. Sclerotic structure in the distal tibia could represent a bone infarct. Negative for fracture or dislocation. No evidence for bone destruction or  periosteal reaction. Wound site is not known. IMPRESSION: 1. No acute bone abnormality to the right foot. 2. Calcaneal spur. 3. 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 Electronically Signed   By: Markus Daft M.D.   On: 03/30/2021 13:15   VAS [place] LOWER EXTREMITY VENOUS (DVT) (7a-7p)  Result Date: 04/14/2021  Lower Venous DVT Study Patient Name:  Daniel [name]  Date of Exam:   04/14/2021 Medical Rec #: [id number]           Accession #:    [id number] Date of Birth: 09-27-1960           Patient Gender: M Patient Age:   37 years Exam Location:  [facility] Procedure:      VAS [place] LOWER EXTREMITY VENOUS (DVT) Referring Phys: Ova Freshwater FONDAW --------------------------------------------------------------------------------  Indications: Edema. Other Indications: Non healing wound left foot. Risk Factors: Patient with rest pain and no options to improve flow to the left foot. Limitations: Involuntary movement secondary to rest pain in left foot. Comparison Study: Prior negative bilateral LEVs done 03/19/21 and 03/25/21 Performing Technologist: Sharion Dove RVS  Examination Guidelines: A complete evaluation includes B-mode imaging, spectral Doppler, color Doppler, and power Doppler as needed of all accessible portions of each vessel. Bilateral testing is considered an integral part of a complete examination. Limited examinations for reoccurring indications may be performed as noted. The reflux portion of the exam is performed with the patient in reverse Trendelenburg.  +-----+---------------+---------+-----------+----------+--------------+  RIGHT Compressibility Phasicity Spontaneity Properties Thrombus Aging  +-----+---------------+---------+-----------+----------+--------------+  CFV   Full            Yes       Yes                                    +-----+---------------+---------+-----------+----------+--------------+   +---------+---------------+---------+-----------+----------+--------------+  LEFT       Compressibility Phasicity Spontaneity Properties Thrombus Aging  +---------+---------------+---------+-----------+----------+--------------+  CFV       Full            Yes       Yes                                    +---------+---------------+---------+-----------+----------+--------------+  SFJ       Full                                                             +---------+---------------+---------+-----------+----------+--------------+  FV Prox  Full                                                             +---------+---------------+---------+-----------+----------+--------------+  FV Mid    Full                                                             +---------+---------------+---------+-----------+----------+--------------+  FV Distal Full                                                             +---------+---------------+---------+-----------+----------+--------------+  PFV       Full                                                             +---------+---------------+---------+-----------+----------+--------------+  POP       Full            Yes       Yes                                    +---------+---------------+---------+-----------+----------+--------------+  PTV       Full            Yes       Yes                                    +---------+---------------+---------+-----------+----------+--------------+     Summary: RIGHT: - No evidence of common femoral vein obstruction.  LEFT: - There is no evidence of deep vein thrombosis in the lower extremity.  Interstitial edema noted throughout the left calf.  *See table(s) above for measurements and observations. Electronically signed by Orlie Pollen on 04/14/2021 at 12:05:45 PM.    Final    VAS [place] LOWER EXTREMITY VENOUS (DVT)  Result Date: 03/25/2021  Lower Venous DVT Study Patient Name:  Daniel [name]  Date of Exam:   03/25/2021 Medical Rec #: [id number]           Accession #:    [id number] Date of Birth: [birth year]            Patient Gender: M Patient Age:   21 years Exam Location:  [facility] Procedure:      VAS [place] LOWER EXTREMITY VENOUS (DVT) Referring Phys: Vicente Serene WOODS --------------------------------------------------------------------------------  Indications: Pain.  Comparison Study: prior 03/19/21 Performing Technologist: Archie Patten RVS  Examination Guidelines: A complete evaluation includes B-mode imaging, spectral Doppler, color Doppler, and power Doppler as needed of all accessible portions of each vessel. Bilateral testing is considered an integral part of a complete examination. Limited  examinations for reoccurring indications may be performed as noted. The reflux portion of the exam is performed with the patient in reverse Trendelenburg.  +---------+---------------+---------+-----------+----------+--------------+  RIGHT     Compressibility Phasicity Spontaneity Properties Thrombus Aging  +---------+---------------+---------+-----------+----------+--------------+  CFV       Full            Yes       Yes                                    +---------+---------------+---------+-----------+----------+--------------+  SFJ       Full                                                             +---------+---------------+---------+-----------+----------+--------------+  FV Prox   Full                                                             +---------+---------------+---------+-----------+----------+--------------+  FV Mid    Full                                                             +---------+---------------+---------+-----------+----------+--------------+  FV Distal Full                                                             +---------+---------------+---------+-----------+----------+--------------+  PFV       Full                                                             +---------+---------------+---------+-----------+----------+--------------+  POP       Full            Yes       Yes                                     +---------+---------------+---------+-----------+----------+--------------+  PTV       Full                                                             +---------+---------------+---------+-----------+----------+--------------+  PERO      Full                                                             +---------+---------------+---------+-----------+----------+--------------+   +---------+---------------+---------+-----------+----------+--------------+  LEFT      Compressibility Phasicity Spontaneity Properties Thrombus Aging  +---------+---------------+---------+-----------+----------+--------------+  CFV       Full            Yes       Yes                                    +---------+---------------+---------+-----------+----------+--------------+  SFJ       Full                                                             +---------+---------------+---------+-----------+----------+--------------+  FV Prox   Full                                                             +---------+---------------+---------+-----------+----------+--------------+  FV Mid    Full                                                             +---------+---------------+---------+-----------+----------+--------------+  FV Distal Full                                                             +---------+---------------+---------+-----------+----------+--------------+  PFV       Full                                                             +---------+---------------+---------+-----------+----------+--------------+  POP       Full            Yes       Yes                                    +---------+---------------+---------+-----------+----------+--------------+  PTV       Full                                                             +---------+---------------+---------+-----------+----------+--------------+  PERO      Full                                                              +---------+---------------+---------+-----------+----------+--------------+  Summary: BILATERAL: - No evidence of deep vein thrombosis seen in the lower extremities, bilaterally. -No evidence of popliteal cyst, bilaterally.   *See table(s) above for measurements and observations. Electronically signed by Monica Martinez MD on 03/25/2021 at 4:44:01 PM.    Final    HYBRID OR IMAGING ([place])  Result Date: 04/19/2021 There is no interpretation for this exam.  This order is for images obtained during a surgical procedure.  Please See "Surgeries" Tab for more information regarding the procedure.    DISCHARGE EXAMINATION: Vitals:   04/23/21 1134 04/23/21 1140 04/23/21 2000 04/24/21 0724  BP: 103/65 115/76 109/72 137/70  Pulse: 80 85 90 82  Resp:  _0 Temp:  98 F (36.7 C) 98.2 F (36.8 C) (!) 97.4 F (36.3 C)  TempSrc:  Oral Oral Oral  SpO2:  100% 96% 98%  Weight:  128.4 kg    Height:       General appearance: Awake alert.  In no distress Resp: Clear to auscultation bilaterally.  Normal effort Cardio: S1-S2 is normal regular.  No S3-S4.  No rubs murmurs or bruit GI: Abdomen is soft.  Nontender nondistended.  Bowel sounds are present normal.  No masses organomegaly   DISPOSITION: SNF  Discharge Instructions     Call MD for:  difficulty breathing, headache or visual disturbances   Complete by: As directed    Call MD for:  extreme fatigue   Complete by: As directed    Call MD for:  persistant dizziness or light-headedness   Complete by: As directed    Call MD for:  persistant nausea and vomiting   Complete by: As directed    Call MD for:  severe uncontrolled pain   Complete by: As directed    Call MD for:  temperature >100.4   Complete by: As directed    Diet - low sodium heart healthy   Complete by: As directed    Diet - low sodium heart healthy   Complete by: As directed    Discharge instructions   Complete by: As directed    Please review instructions on the  discharge summary.  You were cared for by a hospitalist during your hospital stay. If you have any questions about your discharge medications or the care you received while you were in the hospital after you are discharged, you can call the unit and asked to speak with the hospitalist on call if the hospitalist that took care of you is not available. Once you are discharged, your primary care physician will handle any further medical issues. Please note that NO REFILLS for any discharge medications will be authorized once you are discharged, as it is imperative that you return to your primary care physician (or establish a relationship with a primary care physician if you do not have one) for your aftercare needs so that they can reassess your need for medications and monitor your lab values. If you do not have a primary care physician, you can call [phone] for a physician referral.   Discharge wound care:   Complete by: As directed    As per vascular surgery   Discharge wound care:   Complete by: As directed    Dry 4 x 4's to left below the knee amputation site then covered with Kerlix and 4 inch Ace.  Change daily.  Thank you.   Increase activity slowly   Complete by: As directed    Increase activity slowly   Complete by:  As directed           Allergies as of 04/24/2021       Reactions   Seroquel [quetiapine] Other (See Comments)   Tardive kinesia/dystonia   Dilaudid [hydromorphone Hcl] Itching, Other (See Comments)   Pt reports itchiness after IM injection         Medication List     TAKE these medications    (feeding supplement) PROSource Plus liquid Take 30 mLs by mouth 3 (three) times daily between meals.   amLODipine 10 MG tablet Commonly known as: NORVASC Take 1 tablet (10 mg total) by mouth at bedtime.   blood glucose meter kit and supplies Kit Dispense based on patient and insurance preference. Use up to four times daily as directed. (FOR ICD-9 250.00,  250.01).   calcium acetate 667 MG capsule Commonly known as: PHOSLO Take 1,334 mg by mouth 3 (three) times daily with meals.   cloNIDine 0.1 MG tablet Commonly known as: CATAPRES Take 0.1 mg by mouth as directed. TAKE 1 TABLET BID BUT ON DIALYSIS DAYS TAKE ONLY AT BEDTIME.   gabapentin 100 MG capsule Commonly known as: NEURONTIN Take 1 capsule (100 mg total) by mouth 3 (three) times daily.   HYDROcodone-acetaminophen 5-325 MG tablet Commonly known as: NORCO/VICODIN Take 1 tablet by mouth every 6 (six) hours as needed for moderate pain.   hydrOXYzine 25 MG tablet Commonly known as: ATARAX TAKE 1 TABLET BY MOUTH EVERY 8 HOURS AS NEEDED FOR ANXIETY OR  ITCHING Strength: 25 mg What changed: See the new instructions.   lidocaine-prilocaine cream Commonly known as: EMLA Apply 1 application topically once.   loperamide 2 MG capsule Commonly known as: IMODIUM Take 2 capsules (4 mg total) by mouth 3 (three) times daily as needed for diarrhea or loose stools.   multivitamin Tabs tablet Take 1 tablet by mouth at bedtime.   nicotine 14 mg/24hr patch Commonly known as: NICODERM CQ - dosed in mg/24 hours Place 1 patch (14 mg total) onto the skin daily.   pantoprazole 40 MG tablet Commonly known as: PROTONIX Take 1 tablet (40 mg total) by mouth 2 (two) times daily.   saccharomyces boulardii 250 MG capsule Commonly known as: FLORASTOR Take 1 capsule (250 mg total) by mouth 2 (two) times daily.               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  Change daily.  Thank you.   04/24/21 1049   04/23/21 0000  Discharge wound care:       Comments: As per vascular surgery   04/23/21 1235              Contact information for follow-up providers     [facility] Follow up in 2 week(s).   Specialty: Vascular Surgery Why: Office will call you to arrange your appt (sent) Contact information: [place] [phone]             Contact information for after-discharge care     Destination     [facility] Preferred SNF .   Service: Skilled Chiropodist information: [address] [place]  [address] [phone]                     TOTAL DISCHARGE TIME: 35 minutes  Gennie Dib  [facility] Pager on [email or web address]  04/24/2021, 10:51 AM

## 2021-04-24 NOTE — Progress Notes (Signed)
Mobility Specialist Criteria Algorithm Info. ° ° 04/24/21 1145  °Mobility  °Activity Ambulated in hall;Dangled on edge of bed  °Range of Motion/Exercises Passive;Left leg  °Level of Assistance Contact guard assist, steadying assist  °Assistive Device Front wheel walker °(chair follow)  °RLE Weight Bearing WBAT  °LLE Weight Bearing NWB  °Distance Ambulated (ft) 40 ft  °Mobility Sit up in bed/chair position for meals  °Mobility Response Tolerated well  °Mobility performed by Mobility specialist  °Bed Position Semi-fowlers  ° °Patient received in bed. Agreed to participate w/ max encouragement. Required minimal HHA to EOB from supine>sit. Stood min guard and ambulated in hallway with chair follow. Required seated rest break x4 secondary to UE fatigue. Returned to room without complaint or incident. Was left with all needs met, call bell in reach.  ° ° °04/24/2021 °2:34 PM ° °

## 2021-04-25 LAB — CBC
HCT: 26 % — ABNORMAL LOW (ref 39.0–52.0)
Hemoglobin: 8.2 g/dL — ABNORMAL LOW (ref 13.0–17.0)
MCH: 31.5 pg (ref 26.0–34.0)
MCHC: 31.5 g/dL (ref 30.0–36.0)
MCV: 100 fL (ref 80.0–100.0)
Platelets: 138 10*3/uL — ABNORMAL LOW (ref 150–400)
RBC: 2.6 MIL/uL — ABNORMAL LOW (ref 4.22–5.81)
RDW: 23.7 % — ABNORMAL HIGH (ref 11.5–15.5)
WBC: 6.7 10*3/uL (ref 4.0–10.5)
nRBC: 1.5 % — ABNORMAL HIGH (ref 0.0–0.2)

## 2021-04-25 LAB — RENAL FUNCTION PANEL
Albumin: 2.4 g/dL — ABNORMAL LOW (ref 3.5–5.0)
Anion gap: 10 (ref 5–15)
BUN: 25 mg/dL — ABNORMAL HIGH (ref 8–23)
CO2: 26 mmol/L (ref 22–32)
Calcium: 8.5 mg/dL — ABNORMAL LOW (ref 8.9–10.3)
Chloride: 96 mmol/L — ABNORMAL LOW (ref 98–111)
Creatinine, Ser: 6.56 mg/dL — ABNORMAL HIGH (ref 0.61–1.24)
GFR, Estimated: 9 mL/min — ABNORMAL LOW (ref >60)
Glucose, Bld: 107 mg/dL — ABNORMAL HIGH (ref 70–99)
Phosphorus: 3.6 mg/dL (ref 2.5–4.6)
Potassium: 3.8 mmol/L (ref 3.5–5.1)
Sodium: 132 mmol/L — ABNORMAL LOW (ref 135–145)

## 2021-04-25 LAB — GLUCOSE, CAPILLARY
Glucose-Capillary: 111 mg/dL — ABNORMAL HIGH (ref 70–99)
Glucose-Capillary: 126 mg/dL — ABNORMAL HIGH (ref 70–99)

## 2021-04-25 NOTE — Plan of Care (Signed)
  Problem: Nutrition: Goal: Adequate nutrition will be maintained Outcome: Progressing   Problem: Coping: Goal: Level of anxiety will decrease Outcome: Progressing   Problem: Elimination: Goal: Will not experience complications related to bowel motility Outcome: Progressing   Problem: Pain Managment: Goal: General experience of comfort will improve Outcome: Progressing   Problem: Safety: Goal: Ability to remain free from injury will improve Outcome: Progressing   

## 2021-04-25 NOTE — Progress Notes (Signed)
OT Cancellation Note  Patient Details Name: Daniel [name] MRN: [id number] DOB: October 29, 1960   Cancelled Treatment:    Reason Eval/Treat Not Completed: Patient at procedure or test/ unavailable (HD. Will return as schedule allwos.)  [place], OTR/L Acute Rehab Pager: [phone] Office: [phone] 04/25/2021, 7:15 AM

## 2021-04-25 NOTE — TOC Transition Note (Signed)
Transition of Care [facility]) - CM/SW Discharge Note   Patient Details  Name: Daniel [name] MRN: [id number] Date of Birth: 1961/01/12  [facility] CM/SW Contact:  Joanne Chars, LCSW Phone Number: 04/25/2021, 12:33 PM   Clinical Narrative:   Pt discharging to [facility].   RN call [phone] for report.     Final next level of care: Skilled Nursing Facility Barriers to Discharge: Barriers Resolved   Patient Goals and CMS Choice Patient states their goals for this hospitalization and ongoing recovery are:: Pt wants to get better and find long term living arrangements. CMS Medicare.gov Compare Post Acute Care list provided to:: Patient Choice offered to / list presented to : Patient  Discharge Placement              Patient chooses bed at:  ([facility]) Patient to be transferred to facility by: [place] Name of family member notified: neice Lisa Patient and family notified of of transfer: 04/25/21  Discharge Plan and Services     Post Acute Care Choice: [place]                               Social Determinants of Health (SDOH) Interventions     Readmission Risk Interventions Readmission Risk Prevention Plan 02/27/2021 07/07/2019  Transportation Screening Complete Complete  PCP or Specialist Appt within 3-5 Days - Complete  HRI or [place] - Complete  Social Work Consult for [place] Planning/Counseling - Complete  Palliative Care Screening - Not Applicable  Medication Review Press photographer) Complete Complete  PCP or Specialist appointment within 3-5 days of discharge Complete -  [place] or Home Care Consult Complete -  SW Recovery Care/Counseling Consult Complete -  Palliative Care Screening Not Applicable -  [place] Not Applicable -  Some recent data might be hidden

## 2021-04-25 NOTE — Discharge Summary (Signed)
[facility]  Physician Discharge Summary   Patient ID: Daniel [name] MRN: [id number] DOB/AGE: Apr 11, 1961 60 y.o.  Admit date: 04/14/2021 Discharge date:   04/25/2021   PCP: [name], NP    Patient could not be discharged yesterday due  to hemodialysis scheduling, as facility will be only able to transport patient on Monday Wednesday Friday schedule, so he was kept where he is dialyzed today, and to continue his dialysis new schedule tomorrow . 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 male with PMH of ESRD on TTS HD; DM; HTN; ICH in 2019; and  B/L LE PAD presenting with foot infection.  He was last admitted from 12/12-13 for PAD and had aortogram with cannulation of the R common femoral artery, he does not have revascularization options available and so was to f/u with Dr. Scot Dock in 1-2 weeks to discuss TMA vs. BKA now presents with worsening pain and swelling in his foot. In the ED afebrile vitals stable labs with creatinine 8.6, CRP 1.7 lactic acid 1.6, normal WBC count anemia 8.9 g, xray- are fractures through the bases of the third, fourth, and fifth proximal phalanges, unchanged since March 30, 2021 \We were consulted, placed on vancomycin,Flagyl and  ceftriaxone and admitted for further management.  Consultations: Nephrology Vascular surgery  Procedures: Left below-knee amputation    HOSPITAL COURSE:   Diabetic foot infection of the left foot/Bilateral lower extremity PVD Patient was seen by vascular surgery.  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   Patient was on antibiotics which were discontinued after his surgery.  Vascular surgery to inspect the incision site today and give further instructions regarding wound care.   Essential hypertension Stable on amlodipine and clonidine.   Diarrhea Improved with Imodium.  WBC is normal.  Abdomen is benign.  No fever.  Do not suspect infectious etiology.  Continue Imodium and probiotics.     ESRD on HD TTS/Metabolic bone disease Nephrology is following. 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   Initially dialyzed off schedule.  Now back on his usual schedule.    Anemia of renal disease Patient was transfused 2 units of PRBC with hemodialysis on 12/21.  Hemoglobin has been drifting down.  Down to 7.0.  No overt bleeding noted.  Has not had any melanotic stools or blood in the stool.  No bleeding noted from the stump area.  This could all be reflective of his kidney disease.   Transfused additional 2 units of PRBC with hemodialysis on 12/24.   Hemoglobin noted to be stable.   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   Just on SSI.  CBGs are reasonably well controlled.   Involuntary movement of upper extremities He reported occasional jerking movements of his upper extremities.  Noted to be on Neurontin  which can cause tremors.  He is noted to be on Seroquel which can cause tardive dyskinesia and dystonia.  Seroquel was discontinued.  His symptoms have resolved.  We will add Seroquel to his allergy list.    Obesity Estimated body mass  index is 38.74 kg/m as calculated from the following:   Height as of this encounter: 5' 11"  (1.803 m).   Weight as of this encounter: 126 kg.   Patient is stable.  Okay for discharge to SNF today.    PERTINENT LABS:  The results of significant diagnostics from this hospitalization (including imaging, microbiology, ancillary and laboratory) are listed below for reference.    Microbiology: Recent Results (from the past 240 hour(s))  Surgical pcr screen     Status: Abnormal   Collection Time: 04/15/21  4:48 PM   Specimen: Nasal Mucosa; Nasal Swab  Result Value Ref Range Status   MRSA, PCR NEGATIVE NEGATIVE Final   Staphylococcus aureus POSITIVE (A) NEGATIVE Final    Comment: (NOTE) The Xpert SA Assay (FDA approved for NASAL specimens in patients 69 years of age and older), is one component of a comprehensive surveillance program. It is not intended to diagnose infection nor to guide or monitor treatment. Performed at [facility], [place] [address] [place], [place] [address]   SARS CORONAVIRUS 2 (TAT 6-24 HRS) Nasopharyngeal Nasopharyngeal Swab     Status: None   Collection Time: 04/22/21 11:45 AM   Specimen: Nasopharyngeal Swab  Result Value Ref Range Status   SARS Coronavirus 2 NEGATIVE NEGATIVE Final    Comment: (NOTE) SARS-CoV-2 target nucleic acids are NOT DETECTED.  The SARS-CoV-2 RNA is generally detectable in upper and lower respiratory specimens during the acute phase of infection. Negative results do not preclude SARS-CoV-2 infection, do not rule out co-infections with other pathogens, and should not be used as the sole basis for treatment or other patient management decisions. Negative results must be combined with clinical observations, patient history, and epidemiological information. The expected result is Negative.  Fact Sheet for Patients: SugarRoll.be  Fact Sheet for Healthcare  Providers: https://www.woods-mathews.com/  This test is not yet approved or cleared by the [place] FDA and  has been authorized for detection and/or diagnosis of SARS-CoV-2 by FDA under an Emergency Use Authorization (EUA). This EUA will remain  in effect (meaning this test can be used) for the duration of the COVID-19 declaration under Se ction 564(b)(1) of the Act, 21 U.S.C. section 360bbb-3(b)(1), unless the authorization is terminated or revoked sooner.  Performed at [facility], [place] [address], [place], [place] [address]      Labs:  COVID-19 Labs  No results for input(s): DDIMER, FERRITIN, LDH, CRP in the last 72 hours.  Lab Results  Component Value Date   SARSCOV2NAA NEGATIVE 04/22/2021   [place] NEGATIVE 04/14/2021   [place] NEGATIVE 03/24/2021   [place] NEGATIVE 02/23/2021      Basic Metabolic Panel: Recent Labs  Lab 04/18/21 1206 04/19/21 0236 04/23/21 0347 04/25/21 0850  NA 135 136 133* 132*  K 3.9 4.1 4.0 3.8  CL 97* 99 98 96*  CO2 28 27 22 26   GLUCOSE 132* 158* 163* 107*  BUN 23 32* 57* 25*  CREATININE 7.03* 8.31* 11.98* 6.56*  CALCIUM 8.5* 8.3* 8.5* 8.5*  PHOS 2.5 3.1 5.4* 3.6   Liver Function Tests: Recent Labs  Lab 04/18/21 1206 04/19/21 0236 04/23/21 0347 04/25/21 0850  ALBUMIN 2.5* 2.1* 2.2* 2.4*    CBC: Recent Labs  Lab 04/20/21 0236 04/21/21 0500 04/22/21 0358 04/23/21 0347 04/25/21 0850  WBC 6.8 7.4 6.8 6.7 6.7  HGB 7.0* 8.7* 8.4* 8.1* 8.2*  HCT 22.7* 28.2* 26.7* 25.2* 26.0*  MCV 105.1* 101.1* 98.2 97.7 100.0  PLT 144* 136* PLATELET CLUMPS NOTED ON SMEAR, COUNT APPEARS ADEQUATE 152 138*    CBG: Recent Labs  Lab 04/23/21 2038 04/24/21 0649 04/24/21 1155 04/24/21 1555 04/25/21 0628  GLUCAP 126* 101* 103* 144* 111*     IMAGING STUDIES PERIPHERAL VASCULAR CATHETERIZATION  Result Date: 04/08/2021 Images from the original result were not included. 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 Donzetta Matters, MD Procedure Performed: 1.  Ultrasound-guided cannulation right common femoral artery 2.  Aortogram with bilateral lower extremity runoff 3.  Mynx device closure right common femoral artery 4.  Moderate sedation with fentanyl and Versed for 29 minutes Indications: 60 year old male with history of end-stage renal disease currently on dialysis via fistula.  He is indicated for aortogram with bilateral lower extremity runoff due to chronic limb threatening ischemia with left lower extremity ulceration. Findings: The aortoiliac segments are free of flow-limiting stenosis.  Bilateral renal arteries do not appear to fill the kidneys consistent with dialysis access.  On the left side there is calcification of the left SFA does not appear to be flow-limiting.  Below the knee the peroneal artery is very large runs off to the ankle where it feeds a posterior tibial artery which is diminutive.  The posterior tibial artery is occluded for a very long segment.  The anterior tibial artery is patent toe the mid leg and then becomes sclerotic does not proceed onto the foot.  On the right side he has calcification of the SFA 1 area with a large calcified segment that appears to obscure flow but does not appear to impede flow.  Below the knee appears to have posterior tibial artery runoff to the foot.  Patient does not appear to have any endovascular or open surgical options to improve flow into the left foot. Will follow patient up to evaluate his wound possibly would need transmetatarsal amputation versus below-knee amputation.  Procedure:  The patient was identified in the holding area and taken to room 8.  The patient was then placed supine on the table and prepped and draped in the usual  sterile fashion.  A time out was called.  Ultrasound was used to evaluate the right common femoral artery.  This was calcified however free of flow-limiting stenosis.  There is anesthetized 1% lidocaine cannulated with direct ultrasound visualization with micropuncture needle followed by wire sheath.  Images saved in permanent record.  Bentson wires placed followed by 5 [place] sheath.  We performed aortogram followed by bilateral extremity runoff.  With the above findings I elected no intervention.  Catheter was removed over wire.  I performed retrograde angiogram of the right common femoral artery.  I deployed a minx device which supplied well.  He tolerated procedure without any complication Contrast: 17BL Brandon C. Donzetta Matters, MD [facility] Office: [phone] Pager: [phone]   DG Foot Complete Left  Result Date: 04/14/2021 CLINICAL DATA:  Infection. Pain in bilateral feet. Wound on the top of the left foot. EXAM: LEFT FOOT - COMPLETE 3+ VIEW COMPARISON:  None. FINDINGS: Fractures are seen through the bases of the third, fourth, and  fifth proximal phalanges. No evidence of osteomyelitis. No other abnormalities. IMPRESSION: There are fractures through the bases of the third, fourth, and fifth proximal phalanges, unchanged since March 30, 2021. No osteomyelitis identified. Electronically Signed   By: Dorise Bullion III M.D.   On: 04/14/2021 10:03   DG Foot Complete Left  Result Date: 03/30/2021 CLINICAL DATA:  Pain, diabetic wound EXAM: LEFT FOOT - COMPLETE 3+ VIEW COMPARISON:  None. FINDINGS: Plate fixation of the calcaneus. Extensive vascular arterial calcifications. No osseous erosionto suggest osteomyelitis. No soft tissue swelling or subcutaneous gas. IMPRESSION: No radiographic evidence of osteomyelitis. Electronically Signed   By: Suzy Bouchard M.D.   On: 03/30/2021 13:14   DG Foot Complete Right  Result Date: 03/30/2021 CLINICAL DATA:  Pain, diabetic,  wound. EXAM: RIGHT FOOT COMPLETE - 3+ VIEW COMPARISON:  None. FINDINGS: Extensive vascular calcifications throughout the ankle and foot. Large plantar calcaneal spur. Sclerotic structure in the distal tibia could represent a bone infarct. Negative for fracture or dislocation. No evidence for bone destruction or periosteal reaction. Wound site is not known. IMPRESSION: 1. No acute bone abnormality to the right foot. 2. Calcaneal spur. 3. 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 Electronically Signed   By: Markus Daft M.D.   On: 03/30/2021 13:15   VAS [place] LOWER EXTREMITY VENOUS (DVT) (7a-7p)  Result Date: 04/14/2021  Lower Venous DVT Study Patient Name:  Daniel [name]  Date of Exam:   04/14/2021 Medical Rec #: [id number]           Accession #:    [id number] Date of Birth: 10/27/1960           Patient Gender: M Patient Age:   21 years Exam Location:  [facility] Procedure:      VAS [place] LOWER EXTREMITY VENOUS (DVT) Referring Phys: Ova Freshwater FONDAW --------------------------------------------------------------------------------  Indications: Edema. Other Indications: Non healing wound left foot. Risk Factors: Patient with rest pain and no options to improve flow to the left foot. Limitations: Involuntary movement secondary to rest pain in left foot. Comparison Study: Prior negative bilateral LEVs done 03/19/21 and 03/25/21 Performing Technologist: Sharion Dove RVS  Examination Guidelines: A complete evaluation includes B-mode imaging, spectral Doppler, color Doppler, and power Doppler as needed of all accessible portions of each vessel. Bilateral testing is considered an integral part of a complete examination. Limited examinations for reoccurring indications may be performed as noted. The reflux portion of the exam is performed with the patient in reverse Trendelenburg.  +-----+---------------+---------+-----------+----------+--------------+  RIGHT Compressibility Phasicity Spontaneity Properties Thrombus  Aging  +-----+---------------+---------+-----------+----------+--------------+  CFV   Full            Yes       Yes                                    +-----+---------------+---------+-----------+----------+--------------+   +---------+---------------+---------+-----------+----------+--------------+  LEFT      Compressibility Phasicity Spontaneity Properties Thrombus Aging  +---------+---------------+---------+-----------+----------+--------------+  CFV       Full            Yes       Yes                                    +---------+---------------+---------+-----------+----------+--------------+  SFJ       Full                                                             +---------+---------------+---------+-----------+----------+--------------+  FV Prox   Full                                                             +---------+---------------+---------+-----------+----------+--------------+  FV Mid    Full                                                             +---------+---------------+---------+-----------+----------+--------------+  FV Distal Full                                                             +---------+---------------+---------+-----------+----------+--------------+  PFV       Full                                                             +---------+---------------+---------+-----------+----------+--------------+  POP       Full            Yes       Yes                                    +---------+---------------+---------+-----------+----------+--------------+  PTV       Full            Yes       Yes                                    +---------+---------------+---------+-----------+----------+--------------+     Summary: RIGHT: - No evidence of common femoral vein obstruction.  LEFT: - There is no evidence of deep vein thrombosis in the lower extremity.  Interstitial edema noted throughout the left calf.  *See table(s) above for measurements and observations. Electronically signed by  Orlie Pollen on 04/14/2021 at 12:05:45 PM.    Final    HYBRID OR IMAGING ([place])  Result Date: 04/19/2021 There is no interpretation for this exam.  This order is for images obtained during a surgical procedure.  Please See "Surgeries" Tab for more information regarding the procedure.    DISCHARGE EXAMINATION: Vitals:   04/25/21 0722 04/25/21 0800 04/25/21 0830 04/25/21 0900  BP: (!) 129/43 (!) 146/23 (!) 120/31 (!) 169/32  Pulse:  83 83 86  Resp:      Temp:      TempSrc:      SpO2:      Weight:      Height:       General appearance: Awake alert.  In no distress Resp: Clear to auscultation bilaterally.  Normal effort Cardio: S1-S2 is normal regular.  No S3-S4.  No rubs murmurs or bruit GI: Abdomen  is soft.  Nontender nondistended.  Bowel sounds are present normal.  No masses organomegaly   DISPOSITION: SNF  Discharge Instructions     Call MD for:  difficulty breathing, headache or visual disturbances   Complete by: As directed    Call MD for:  extreme fatigue   Complete by: As directed    Call MD for:  persistant dizziness or light-headedness   Complete by: As directed    Call MD for:  persistant nausea and vomiting   Complete by: As directed    Call MD for:  severe uncontrolled pain   Complete by: As directed    Call MD for:  temperature >100.4   Complete by: As directed    Diet - low sodium heart healthy   Complete by: As directed    Diet - low sodium heart healthy   Complete by: As directed    Discharge instructions   Complete by: As directed    Please review instructions on the discharge summary.  You were cared for by a hospitalist during your hospital stay. If you have any questions about your discharge medications or the care you received while you were in the hospital after you are discharged, you can call the unit and asked to speak with the hospitalist on call if the hospitalist that took care of you is not available. Once you are discharged, your primary  care physician will handle any further medical issues. Please note that NO REFILLS for any discharge medications will be authorized once you are discharged, as it is imperative that you return to your primary care physician (or establish a relationship with a primary care physician if you do not have one) for your aftercare needs so that they can reassess your need for medications and monitor your lab values. If you do not have a primary care physician, you can call [phone] for a physician referral.   Discharge wound care:   Complete by: As directed    As per vascular surgery   Discharge wound care:   Complete by: As directed    Dry 4 x 4's to left below the knee amputation site then covered with Kerlix and 4 inch Ace.  Change daily.  Thank you.   Increase activity slowly   Complete by: As directed    Increase activity slowly   Complete by: As directed           Allergies as of 04/25/2021       Reactions   Seroquel [quetiapine] Other (See Comments)   Tardive kinesia/dystonia   Dilaudid [hydromorphone Hcl] Itching, Other (See Comments)   Pt reports itchiness after IM injection         Medication List     TAKE these medications    (feeding supplement) PROSource Plus liquid Take 30 mLs by mouth 3 (three) times daily between meals.   amLODipine 10 MG tablet Commonly known as: NORVASC Take 1 tablet (10 mg total) by mouth at bedtime.   blood glucose meter kit and supplies Kit Dispense based on patient and insurance preference. Use up to four times daily as directed. (FOR ICD-9 250.00, 250.01).   calcium acetate 667 MG capsule Commonly known as: PHOSLO Take 1,334 mg by mouth 3 (three) times daily with meals.   cloNIDine 0.1 MG tablet Commonly known as: CATAPRES Take 0.1 mg by mouth as directed. TAKE 1 TABLET BID BUT ON DIALYSIS DAYS TAKE ONLY AT BEDTIME.   gabapentin 100 MG capsule Commonly known as:  NEURONTIN Take 1 capsule (100 mg total) by mouth 3 (three) times  daily.   HYDROcodone-acetaminophen 5-325 MG tablet Commonly known as: NORCO/VICODIN Take 1 tablet by mouth every 6 (six) hours as needed for moderate pain.   hydrOXYzine 25 MG tablet Commonly known as: ATARAX TAKE 1 TABLET BY MOUTH EVERY 8 HOURS AS NEEDED FOR ANXIETY OR  ITCHING Strength: 25 mg What changed: See the new instructions.   lidocaine-prilocaine cream Commonly known as: EMLA Apply 1 application topically once.   loperamide 2 MG capsule Commonly known as: IMODIUM Take 2 capsules (4 mg total) by mouth 3 (three) times daily as needed for diarrhea or loose stools.   multivitamin Tabs tablet Take 1 tablet by mouth at bedtime.   nicotine 14 mg/24hr patch Commonly known as: NICODERM CQ - dosed in mg/24 hours Place 1 patch (14 mg total) onto the skin daily.   pantoprazole 40 MG tablet Commonly known as: PROTONIX Take 1 tablet (40 mg total) by mouth 2 (two) times daily.   saccharomyces boulardii 250 MG capsule Commonly known as: FLORASTOR Take 1 capsule (250 mg total) by mouth 2 (two) times daily.               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  Change daily.  Thank you.   04/24/21 1049   04/23/21 0000  Discharge wound care:       Comments: As per vascular surgery   04/23/21 1235              Contact information for follow-up providers     [facility] - Follow up in 2 week(s).   Specialty: Vascular Surgery Why: Office will call you to arrange your appt (sent) Contact information: [place] [phone]             Contact information for after-discharge care     Destination     [facility] Preferred SNF .   Service: Skilled Chiropodist  information: [place] [phone]                     TOTAL DISCHARGE TIME: 35 minutes  Saje Gallop  [facility] Pager on [email or web address]  04/25/2021, 9:37 AM

## 2021-04-25 NOTE — Progress Notes (Signed)
PT Cancellation Note  Patient Details Name: DIMITRI SHAKESPEARE MRN: [id number] DOB: [birth year]   Cancelled Treatment:    Reason Eval/Treat Not Completed: Patient at procedure or test/unavailable (HD)   Dara Camargo B Ramond Darnell 04/25/2021, 7:15 AM Bayard Males, PT Acute Rehabilitation Services Pager: [phone] Office: [phone]

## 2021-04-25 NOTE — Plan of Care (Addendum)
Patient stable on DC. No questions. IV removed. Report given to Amy, RN at [facility].   Problem: Education: Goal: Knowledge of General Education information will improve Description: Including pain rating scale, medication(s)/side effects and non-pharmacologic comfort measures 04/25/2021 1509 by Trixie Deis, RN Outcome: Adequate for Discharge 04/25/2021 1236 by Trixie Deis, RN Outcome: Progressing   Problem: Health Behavior/Discharge Planning: Goal: Ability to manage health-related needs will improve Outcome: Adequate for Discharge   Problem: Clinical Measurements: Goal: Ability to maintain clinical measurements within normal limits will improve Outcome: Adequate for Discharge Goal: Will remain free from infection Outcome: Adequate for Discharge Goal: Diagnostic test results will improve Outcome: Adequate for Discharge Goal: Respiratory complications will improve Outcome: Adequate for Discharge Goal: Cardiovascular complication will be avoided Outcome: Adequate for Discharge   Problem: Activity: Goal: Risk for activity intolerance will decrease 04/25/2021 1509 by Trixie Deis, RN Outcome: Adequate for Discharge 04/25/2021 1236 by Trixie Deis, RN Outcome: Progressing   Problem: Nutrition: Goal: Adequate nutrition will be maintained Outcome: Adequate for Discharge   Problem: Coping: Goal: Level of anxiety will decrease Outcome: Adequate for Discharge   Problem: Elimination: Goal: Will not experience complications related to bowel motility Outcome: Adequate for Discharge Goal: Will not experience complications related to urinary retention Outcome: Adequate for Discharge   Problem: Pain Managment: Goal: General experience of comfort will improve 04/25/2021 1509 by Trixie Deis, RN Outcome: Adequate for Discharge 04/25/2021 1236 by Trixie Deis, RN Outcome: Progressing   Problem: Safety: Goal: Ability to remain free from injury will  improve 04/25/2021 1509 by Trixie Deis, RN Outcome: Adequate for Discharge 04/25/2021 1236 by Trixie Deis, RN Outcome: Progressing   Problem: Skin Integrity: Goal: Risk for impaired skin integrity will decrease 04/25/2021 1509 by Trixie Deis, RN Outcome: Adequate for Discharge 04/25/2021 1236 by Trixie Deis, RN Outcome: Progressing

## 2021-04-25 NOTE — Progress Notes (Signed)
Contacted [facility] ([facility]) to advise clinic of pt's d/c to snf today. Pt will start MWF schedule tomorrow at clinic. CSW provided schedule yesterday to provide to snf.   Melven Sartorius Renal Navigator [phone]

## 2021-04-25 NOTE — Progress Notes (Signed)
PT Cancellation Note  Patient Details Name: Daniel [name] MRN: [id number] DOB: 17-Mar-1961   Cancelled Treatment:    Reason Eval/Treat Not Completed: Patient declined, no reason specified (pt reports pain 8/10 despite medication 1hr prior and adamantly refused mobility at this time)   Justinian Miano B Devian Bartolomei 04/25/2021, 12:26 PM Bayard Males, PT Acute Rehabilitation Services Pager: [phone] Office: [phone]

## 2021-04-25 NOTE — Progress Notes (Signed)
[facility] Progress Note   Subjective:    Seen and examined on HD. Tolerating UFG 2.5L. No complaints. Plan to DC to SNF after HD today.  Objective Vitals:   04/25/21 0900 04/25/21 0930 04/25/21 1000 04/25/21 1030  BP: (!) 169/32 (!) 125/38 (!) 171/50 (!) 174/60  Pulse: 86 94 92   Resp:    (!) 22  Temp:      TempSrc:      SpO2:      Weight:      Height:       Physical Exam General: Older male; NAD Heart: Normal S1 and S2; No murmurs, gallops, or rubs Lungs: Clear throughout; No wheezing, rales, or rhonchi Abdomen: Large, soft, and non-tender Extremities: L BKA-wrapped with ace wrap; No edema RLE Dialysis Access: L AVF-surgical glue intact; RIJ [facility]   Filed Weights   04/23/21 0735 04/23/21 1140 04/25/21 0711  Weight: 130.3 kg 128.4 kg 129.7 kg    Intake/Output Summary (Last 24 hours) at 04/25/2021 1245 Last data filed at 04/25/2021 7673 Gross per 24 hour  Intake 720 ml  Output 100 ml  Net 620 ml    Additional Objective Labs: Basic Metabolic Panel: Recent Labs  Lab 04/19/21 0236 04/23/21 0347 04/25/21 0850  NA 136 133* 132*  K 4.1 4.0 3.8  CL 99 98 96*  CO2 27 22 26   GLUCOSE 158* 163* 107*  BUN 32* 57* 25*  CREATININE 8.31* 11.98* 6.56*  CALCIUM 8.3* 8.5* 8.5*  PHOS 3.1 5.4* 3.6   Liver Function Tests: Recent Labs  Lab 04/19/21 0236 04/23/21 0347 04/25/21 0850  ALBUMIN 2.1* 2.2* 2.4*   No results for input(s): LIPASE, AMYLASE in the last 168 hours. CBC: Recent Labs  Lab 04/20/21 0236 04/21/21 0500 04/22/21 0358 04/23/21 0347 04/25/21 0850  WBC 6.8 7.4 6.8 6.7 6.7  HGB 7.0* 8.7* 8.4* 8.1* 8.2*  HCT 22.7* 28.2* 26.7* 25.2* 26.0*  MCV 105.1* 101.1* 98.2 97.7 100.0  PLT 144* 136* PLATELET CLUMPS NOTED ON SMEAR, COUNT APPEARS ADEQUATE 152 138*   Blood Culture    Component Value Date/Time   SDES BLOOD RIGHT HAND 04/14/2021 1035   SPECREQUEST  04/14/2021 1035    BOTTLES DRAWN AEROBIC AND ANAEROBIC Blood Culture adequate  volume   CULT  04/14/2021 1035    NO GROWTH 5 DAYS Performed at [facility], [place] [address], [place], [place] [address]    REPTSTATUS 04/19/2021 FINAL 04/14/2021 1035    Cardiac Enzymes: No results for input(s): CKTOTAL, CKMB, CKMBINDEX, TROPONINI in the last 168 hours. CBG: Recent Labs  Lab 04/24/21 0649 04/24/21 1155 04/24/21 1555 04/25/21 0628 04/25/21 1128  GLUCAP 101* 103* 144* 111* 126*   Iron Studies: No results for input(s): IRON, TIBC, TRANSFERRIN, FERRITIN in the last 72 hours. Lab Results  Component Value Date   INR 1.0 03/24/2021   INR 1.1 10/28/2020   INR 1.1 10/27/2020   Studies/Results: No results found.  Medications:   (feeding supplement) PROSource Plus  30 mL Oral TID BM   sodium chloride   Intravenous Once   acetaminophen  650 mg Oral Once   amLODipine  10 mg Oral QHS   calcium acetate  1,334 mg Oral TID WC   Chlorhexidine Gluconate Cloth  6 each Topical Q0600   cloNIDine  0.1 mg Oral Once per day on Tue Thu Sat   cloNIDine  0.1 mg Oral 2 times per day on Sun Mon Wed Fri   diphenhydrAMINE  25 mg Intravenous  Once   gabapentin  100 mg Oral TID   heparin  5,000 Units Subcutaneous Q8H   insulin aspart  0-6 Units Subcutaneous TID WC   multivitamin  1 tablet Oral QHS   mupirocin ointment   Nasal BID   nicotine  14 mg Transdermal Daily   pantoprazole  40 mg Oral BID   saccharomyces boulardii  250 mg Oral BID    Dialysis Orders: TTS - [place] 4hrs, BFR 450, DFR 500,  [id number], 2K/ 2Ca Mircera 225 mcg IV q2wks - last 04/06/21 No Systemic Heparin Hectorol 43mcg IV with HD-04/13/21 Venofer 100mg  IV X 6 doses-last 04/13/21   Assessment/Plan: Diabetic L foot infection/PVD-Underwent LE angiography 04/08/21 showed limb ischemia L leg. 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 Scot Dock. Currently off abx now. Per primary service Aneurysmal AVF: Went for plication of AVF and placement of TDC on 12/23 by Dr. Scot Dock Constipation: per primary  service ESRD - T,Th,S -HD today.  Hypertension/volume  - Patient euvolemic on exam. Bps stable. Continue current anti-hypertensive regimen, UF as tolerated. Not convinced inpatient weights are accurate considering patient now has L BKA. Will obtain weight in outpatient. More likely will need to lower EDW. Anemia of CKD - S/P 2 units PRBCs 04/17/21. S/p transfusion w/ hd yesterday, hgb 8.2 today Secondary Hyperparathyroidism - Ca and PO4 ok Nutrition - renal diet with fluid restrictions. Add protein supps Disposition: Patient will be discharged to SNF for rehab. HD schedule now changed to MWF (transportation availability). He completed HD today. Okay for discharge from renal standpoint. Plan for patient to receive HD again tomorrow 04/26/21 in outpatient HD center for short treatment (2.5hrs) to place him on new HD schedule. Discussed with Dr. Jonnie Finner, Renal Navigator, Hospitalist, and unit RN.  Tobie Poet, NP [facility] 04/25/2021,12:45 PM  LOS: 11 days

## 2021-04-25 NOTE — Plan of Care (Signed)

## 2021-05-08 ENCOUNTER — Ambulatory Visit: Payer: Medicare Other

## 2021-05-23 ENCOUNTER — Ambulatory Visit (INDEPENDENT_AMBULATORY_CARE_PROVIDER_SITE_OTHER): Payer: Medicare Other | Admitting: Physician Assistant

## 2021-05-23 ENCOUNTER — Other Ambulatory Visit: Payer: Self-pay

## 2021-05-23 VITALS — BP 125/72 | Temp 98.6°F | Ht 71.0 in

## 2021-05-23 DIAGNOSIS — I739 Peripheral vascular disease, unspecified: Secondary | ICD-10-CM

## 2021-05-23 DIAGNOSIS — N186 End stage renal disease: Secondary | ICD-10-CM

## 2021-05-23 MED ORDER — HYDROCODONE-ACETAMINOPHEN 5-325 MG PO TABS: 1.0000 | ORAL_TABLET | Freq: Four times a day (QID) | ORAL | 0 refills | Status: DC | PRN

## 2021-05-23 NOTE — Progress Notes (Signed)
POST OPERATIVE OFFICE NOTE    CC:  F/u for surgery  HPI:  This is a 61 y.o. male with ESRD and PAD.  He presented with bilateral foot pain in early December.  He underwent aortogram on 04/08/2021 and he did not have any revascularization options.  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 Scot Dock on 04/19/2021.    Pt returns today for follow up.  Pt states he is having pain in the lateral aspect of the stump.  He denies any drainage, fever or chills.  He states that as of today, the facility will not give him any more Norco.  He states he is currently dialyzing via a catheter and it is working well.  He is on HD on M/W/F.  He does not have any pain in his left hand.    Allergies  Allergen Reactions   Seroquel [Quetiapine] Other (See Comments)    Tardive kinesia/dystonia   Dilaudid [Hydromorphone Hcl] Itching and Other (See Comments)    Pt reports itchiness after IM injection     Current Outpatient Medications  Medication Sig Dispense Refill   amLODipine (NORVASC) 10 MG tablet Take 1 tablet (10 mg total) by mouth at bedtime. 90 tablet 0   blood glucose meter kit and supplies KIT Dispense based on patient and insurance preference. Use up to four times daily as directed. (FOR ICD-9 250.00, 250.01). 1 each 0   calcium acetate (PHOSLO) 667 MG capsule Take 1,334 mg by mouth 3 (three) times daily with meals.     cloNIDine (CATAPRES) 0.1 MG tablet Take 0.1 mg by mouth as directed. TAKE 1 TABLET BID BUT ON DIALYSIS DAYS TAKE ONLY AT BEDTIME.     gabapentin (NEURONTIN) 100 MG capsule Take 1 capsule (100 mg total) by mouth 3 (three) times daily. 90 capsule 3   HYDROcodone-acetaminophen (NORCO/VICODIN) 5-325 MG tablet Take 1 tablet by mouth every 6 (six) hours as needed for moderate pain. 25 tablet  0   lidocaine-prilocaine (EMLA) cream Apply 1 application topically once.     loperamide (IMODIUM) 2 MG capsule Take 2 capsules (4 mg total) by mouth 3 (three) times daily as needed for diarrhea or loose stools. 30 capsule 0   multivitamin (RENA-VIT) TABS tablet Take 1 tablet by mouth at bedtime.  0   pantoprazole (PROTONIX) 40 MG tablet Take 1 tablet (40 mg total) by mouth 2 (two) times daily. 60 tablet 2   saccharomyces boulardii (FLORASTOR) 250 MG capsule Take 1 capsule (250 mg total) by mouth 2 (two) times daily.     hydrOXYzine (ATARAX) 25 MG tablet TAKE 1 TABLET BY MOUTH EVERY 8 HOURS AS NEEDED FOR ANXIETY OR  ITCHING Strength: 25 mg (Patient not taking: Reported on 05/23/2021) 30 tablet 0   No current facility-administered medications for this visit.     ROS:  See HPI  Physical Exam:  Today's Vitals   05/23/21 1113  BP: 125/72  Temp: 98.6 F (37 C)  Height: 5' 11"  (1.803 m)  PainSc: 5   PainLoc: Leg   Body mass index is 39.11 kg/m.   Incision:       Extremities:  left arm incision has healed nicely and the fistula has an excellent thrill.     Assessment/Plan:  This is a 61 y.o. male who is s/p: Left BKA on 79/19/9579 and plication of left arm fistula on 04/19/2021 both by Dr. Scot Dock  ESRD -incision on left arm has healed. The fistula has an excellent thrill.  The fistula can be accessed on 06/14/2021.  Left BKA -pt continuing to have pain in the lateral aspect of the stump.  He is not having any drainage and he has not had any fever or chills.  His staples were removed today.  I discussed with pt that his stump is at risk for not healing.  He does have some edema in the stump and he sits with it hanging downward during the day.  I have asked him to elevate his stump to help alleviate edema.  Okay to clean with soap and water daily and reapply ace wrap daily.   I have also sent him with a prescription for Norco 5/235 one q6h prn pain #10 no refill.  PDMP was  reviewed.  -we will have him return in one week to check his stump when Dr. Scot Dock is here in the office.     Leontine Locket, [facility] [phone]   Clinic MD:  Donzetta Matters

## 2021-05-30 ENCOUNTER — Other Ambulatory Visit: Payer: Self-pay

## 2021-05-30 ENCOUNTER — Ambulatory Visit (INDEPENDENT_AMBULATORY_CARE_PROVIDER_SITE_OTHER): Payer: Medicare Other | Admitting: Physician Assistant

## 2021-05-30 VITALS — BP 151/88 | HR 87 | Temp 97.3°F | Resp 20 | Ht 71.0 in

## 2021-05-30 DIAGNOSIS — I739 Peripheral vascular disease, unspecified: Secondary | ICD-10-CM

## 2021-05-30 DIAGNOSIS — Z89512 Acquired absence of left leg below knee: Secondary | ICD-10-CM

## 2021-05-30 DIAGNOSIS — N186 End stage renal disease: Secondary | ICD-10-CM

## 2021-05-30 MED ORDER — OXYCODONE-ACETAMINOPHEN 5-325 MG PO TABS: 1.0000 | ORAL_TABLET | Freq: Four times a day (QID) | ORAL | 0 refills | Status: DC | PRN

## 2021-05-30 NOTE — Progress Notes (Signed)
POST OPERATIVE OFFICE NOTE    CC:  F/u for surgery  HPI:  This is a 61 y.o. male who returns for wound check follow up s/p left BKA. His BKA was performed on 04/16/21 by Dr. Scot Dock. He was seen last week and had staples removed but eschar was present. He was given close follow up with concerns of high risk of non healing. He continues to report daily pain in the left BKA stump. Says stump itself is very tender but also has very severe sharp shooting pains.   He is also s/p left AV fistula plication and TDC insertion on 04/19/21 by Dr. Scot Dock. This has healed very nicely. The fistula and TDC are both functioning. He dialyzes on MWF. He has been cleared to start using AV Fistula on 06/14/21.  Allergies  Allergen Reactions   Seroquel [Quetiapine] Other (See Comments)    Tardive kinesia/dystonia   Dilaudid [Hydromorphone Hcl] Itching and Other (See Comments)    Pt reports itchiness after IM injection     Current Outpatient Medications  Medication Sig Dispense Refill   amLODipine (NORVASC) 10 MG tablet Take 1 tablet (10 mg total) by mouth at bedtime. 90 tablet 0   blood glucose meter kit and supplies KIT Dispense based on patient and insurance preference. Use up to four times daily as directed. (FOR ICD-9 250.00, 250.01). 1 each 0   calcium acetate (PHOSLO) 667 MG capsule Take 1,334 mg by mouth 3 (three) times daily with meals.     cloNIDine (CATAPRES) 0.1 MG tablet Take 0.1 mg by mouth as directed. TAKE 1 TABLET BID BUT ON DIALYSIS DAYS TAKE ONLY AT BEDTIME.     gabapentin (NEURONTIN) 100 MG capsule Take 1 capsule (100 mg total) by mouth 3 (three) times daily. 90 capsule 3   HYDROcodone-acetaminophen (NORCO/VICODIN) 5-325 MG tablet Take 1 tablet by mouth every 6 (six) hours as needed for moderate pain. 10 tablet 0   hydrOXYzine (ATARAX) 25 MG tablet TAKE 1 TABLET BY MOUTH EVERY 8 HOURS AS NEEDED FOR ANXIETY OR  ITCHING Strength: 25 mg 30 tablet 0   lidocaine-prilocaine (EMLA) cream Apply 1  application topically once.     loperamide (IMODIUM) 2 MG capsule Take 2 capsules (4 mg total) by mouth 3 (three) times daily as needed for diarrhea or loose stools. 30 capsule 0   multivitamin (RENA-VIT) TABS tablet Take 1 tablet by mouth at bedtime.  0   pantoprazole (PROTONIX) 40 MG tablet Take 1 tablet (40 mg total) by mouth 2 (two) times daily. 60 tablet 2   saccharomyces boulardii (FLORASTOR) 250 MG capsule Take 1 capsule (250 mg total) by mouth 2 (two) times daily.     No current facility-administered medications for this visit.     ROS:  See HPI  Physical Exam:  Vitals:   05/30/21 0913  BP: (!) 151/88  Pulse: 87  Resp: 20  Temp: (!) 97.3 F (36.3 C)  TempSrc: Oral  SpO2: 97%  Height: $Remove'5\' 11"'KVrvhnJ$  (1.803 m)    Incision:  left BKA with large eschar on lateral posterior flap. Some serosanguinous drainage on compression mid incision line. Steri strips removed. Silvadene applied to Eschar followed by non adhesive gauze, Kerlix and ACE wrap   Extremities:  left arm incisions well healed, fistula with excellent thrill Neuro: alert and oriented   Assessment/Plan:  This is a 61 y.o. male who is s/p left BKA. BKA is at high risk for non healing. Large eschar present. Patient seen with Dr.  Scot Dock. He feels there is still opportunity to salvage BKA. Discussed with patient that if continues to not heal or worsens will likely require revision to AKA.  - wash BKA daily with mild soap and water, pat dry. Apply Silvadene Cream to eschar daily followed by gauze, kerlix and ACE - will order Ampushield protector from [employer] to help with knee alignment and to keep BKA protected - Left AV Fistula can be accessed after 06/14/20. TDC can be scheduled for removal after dialysis center feels that function of fistula is adequate -PDMP reviewed and new pain prescription given to patient for Hydrocodone-Acetaminophen 5-325 mg #20 no refills - He will follow up in 2-3 weeks for wound check with Dr.  Edwena Bunde, PA-C [facility] [phone]  Clinic MD:  [place]/ Trula Slade

## 2021-06-13 ENCOUNTER — Other Ambulatory Visit: Payer: Self-pay

## 2021-06-13 ENCOUNTER — Inpatient Hospital Stay (HOSPITAL_COMMUNITY)
Admission: EM | Admit: 2021-06-13 | Discharge: 2021-06-21 | DRG: 264 | Disposition: A | Discharge status: Skilled Nursing Facility | Payer: Medicare Other | Source: Skilled Nursing Facility | Attending: Internal Medicine | Admitting: Internal Medicine

## 2021-06-13 ENCOUNTER — Emergency Department (HOSPITAL_COMMUNITY): Payer: Medicare Other

## 2021-06-13 ENCOUNTER — Encounter: Payer: Self-pay | Admitting: Physician Assistant

## 2021-06-13 ENCOUNTER — Ambulatory Visit (INDEPENDENT_AMBULATORY_CARE_PROVIDER_SITE_OTHER): Payer: Medicare Other | Admitting: Physician Assistant

## 2021-06-13 DIAGNOSIS — E1122 Type 2 diabetes mellitus with diabetic chronic kidney disease: Secondary | ICD-10-CM | POA: Diagnosis present

## 2021-06-13 DIAGNOSIS — I2510 Atherosclerotic heart disease of native coronary artery without angina pectoris: Secondary | ICD-10-CM | POA: Diagnosis present

## 2021-06-13 DIAGNOSIS — R072 Precordial pain: Secondary | ICD-10-CM

## 2021-06-13 DIAGNOSIS — N186 End stage renal disease: Secondary | ICD-10-CM | POA: Diagnosis present

## 2021-06-13 DIAGNOSIS — F17210 Nicotine dependence, cigarettes, uncomplicated: Secondary | ICD-10-CM | POA: Diagnosis present

## 2021-06-13 DIAGNOSIS — Z79899 Other long term (current) drug therapy: Secondary | ICD-10-CM

## 2021-06-13 DIAGNOSIS — D649 Anemia, unspecified: Secondary | ICD-10-CM

## 2021-06-13 DIAGNOSIS — I5032 Chronic diastolic (congestive) heart failure: Secondary | ICD-10-CM | POA: Diagnosis present

## 2021-06-13 DIAGNOSIS — R079 Chest pain, unspecified: Secondary | ICD-10-CM

## 2021-06-13 DIAGNOSIS — D631 Anemia in chronic kidney disease: Secondary | ICD-10-CM | POA: Diagnosis present

## 2021-06-13 DIAGNOSIS — Z720 Tobacco use: Secondary | ICD-10-CM | POA: Diagnosis present

## 2021-06-13 DIAGNOSIS — L309 Dermatitis, unspecified: Secondary | ICD-10-CM | POA: Diagnosis present

## 2021-06-13 DIAGNOSIS — Z89512 Acquired absence of left leg below knee: Secondary | ICD-10-CM

## 2021-06-13 DIAGNOSIS — F17200 Nicotine dependence, unspecified, uncomplicated: Secondary | ICD-10-CM | POA: Diagnosis present

## 2021-06-13 DIAGNOSIS — Z6836 Body mass index (BMI) 36.0-36.9, adult: Secondary | ICD-10-CM

## 2021-06-13 DIAGNOSIS — N2581 Secondary hyperparathyroidism of renal origin: Secondary | ICD-10-CM | POA: Diagnosis present

## 2021-06-13 DIAGNOSIS — R0789 Other chest pain: Secondary | ICD-10-CM | POA: Diagnosis present

## 2021-06-13 DIAGNOSIS — K219 Gastro-esophageal reflux disease without esophagitis: Secondary | ICD-10-CM | POA: Diagnosis present

## 2021-06-13 DIAGNOSIS — I5033 Acute on chronic diastolic (congestive) heart failure: Secondary | ICD-10-CM | POA: Diagnosis present

## 2021-06-13 DIAGNOSIS — I739 Peripheral vascular disease, unspecified: Secondary | ICD-10-CM | POA: Diagnosis present

## 2021-06-13 DIAGNOSIS — E1151 Type 2 diabetes mellitus with diabetic peripheral angiopathy without gangrene: Secondary | ICD-10-CM | POA: Diagnosis present

## 2021-06-13 DIAGNOSIS — M62838 Other muscle spasm: Secondary | ICD-10-CM | POA: Diagnosis present

## 2021-06-13 DIAGNOSIS — Z8673 Personal history of transient ischemic attack (TIA), and cerebral infarction without residual deficits: Secondary | ICD-10-CM

## 2021-06-13 DIAGNOSIS — Z992 Dependence on renal dialysis: Secondary | ICD-10-CM

## 2021-06-13 DIAGNOSIS — I132 Hypertensive heart and chronic kidney disease with heart failure and with stage 5 chronic kidney disease, or end stage renal disease: Principal | ICD-10-CM | POA: Diagnosis present

## 2021-06-13 DIAGNOSIS — D509 Iron deficiency anemia, unspecified: Secondary | ICD-10-CM | POA: Diagnosis present

## 2021-06-13 DIAGNOSIS — G8929 Other chronic pain: Secondary | ICD-10-CM | POA: Diagnosis present

## 2021-06-13 DIAGNOSIS — Z888 Allergy status to other drugs, medicaments and biological substances status: Secondary | ICD-10-CM

## 2021-06-13 DIAGNOSIS — I10 Essential (primary) hypertension: Secondary | ICD-10-CM | POA: Diagnosis present

## 2021-06-13 DIAGNOSIS — E119 Type 2 diabetes mellitus without complications: Secondary | ICD-10-CM

## 2021-06-13 DIAGNOSIS — Z885 Allergy status to narcotic agent status: Secondary | ICD-10-CM

## 2021-06-13 DIAGNOSIS — M79604 Pain in right leg: Secondary | ICD-10-CM | POA: Diagnosis present

## 2021-06-13 DIAGNOSIS — Z20822 Contact with and (suspected) exposure to covid-19: Secondary | ICD-10-CM | POA: Diagnosis present

## 2021-06-13 DIAGNOSIS — D7589 Other specified diseases of blood and blood-forming organs: Secondary | ICD-10-CM | POA: Diagnosis present

## 2021-06-13 DIAGNOSIS — E669 Obesity, unspecified: Secondary | ICD-10-CM | POA: Diagnosis present

## 2021-06-13 LAB — TROPONIN I (HIGH SENSITIVITY)
Troponin I (High Sensitivity): 17 ng/L (ref <18)
Troponin I (High Sensitivity): 18 ng/L — ABNORMAL HIGH (ref <18)

## 2021-06-13 LAB — CBC WITH DIFFERENTIAL/PLATELET
Abs Immature Granulocytes: 0.02 10*3/uL (ref 0.00–0.07)
Basophils Absolute: 0 10*3/uL (ref 0.0–0.1)
Basophils Relative: 0 %
Eosinophils Absolute: 0.1 10*3/uL (ref 0.0–0.5)
Eosinophils Relative: 2 %
HCT: 23.4 % — ABNORMAL LOW (ref 39.0–52.0)
Hemoglobin: 7.3 g/dL — ABNORMAL LOW (ref 13.0–17.0)
Immature Granulocytes: 0 %
Lymphocytes Relative: 38 %
Lymphs Abs: 2 10*3/uL (ref 0.7–4.0)
MCH: 33.6 pg (ref 26.0–34.0)
MCHC: 31.2 g/dL (ref 30.0–36.0)
MCV: 107.8 fL — ABNORMAL HIGH (ref 80.0–100.0)
Monocytes Absolute: 0.8 10*3/uL (ref 0.1–1.0)
Monocytes Relative: 16 %
Neutro Abs: 2.4 10*3/uL (ref 1.7–7.7)
Neutrophils Relative %: 44 %
Platelets: 179 10*3/uL (ref 150–400)
RBC: 2.17 MIL/uL — ABNORMAL LOW (ref 4.22–5.81)
RDW: 16.6 % — ABNORMAL HIGH (ref 11.5–15.5)
WBC: 5.3 10*3/uL (ref 4.0–10.5)
nRBC: 0 % (ref 0.0–0.2)

## 2021-06-13 LAB — COMPREHENSIVE METABOLIC PANEL
ALT: 12 U/L (ref 0–44)
AST: 39 U/L (ref 15–41)
Albumin: 2.5 g/dL — ABNORMAL LOW (ref 3.5–5.0)
Alkaline Phosphatase: 67 U/L (ref 38–126)
Anion gap: 14 (ref 5–15)
BUN: 18 mg/dL (ref 8–23)
CO2: 28 mmol/L (ref 22–32)
Calcium: 9.4 mg/dL (ref 8.9–10.3)
Chloride: 97 mmol/L — ABNORMAL LOW (ref 98–111)
Creatinine, Ser: 7.86 mg/dL — ABNORMAL HIGH (ref 0.61–1.24)
GFR, Estimated: 7 mL/min — ABNORMAL LOW (ref >60)
Glucose, Bld: 96 mg/dL (ref 70–99)
Potassium: 3.4 mmol/L — ABNORMAL LOW (ref 3.5–5.1)
Sodium: 139 mmol/L (ref 135–145)
Total Bilirubin: 0.5 mg/dL (ref 0.3–1.2)
Total Protein: 8.4 g/dL — ABNORMAL HIGH (ref 6.5–8.1)

## 2021-06-13 LAB — GLUCOSE, CAPILLARY: Glucose-Capillary: 123 mg/dL — ABNORMAL HIGH (ref 70–99)

## 2021-06-13 LAB — POC OCCULT BLOOD, ED: Fecal Occult Bld: NEGATIVE

## 2021-06-13 LAB — RESP PANEL BY RT-PCR (FLU A&B, COVID) ARPGX2
Influenza A by PCR: NEGATIVE
Influenza B by PCR: NEGATIVE
SARS Coronavirus 2 by RT PCR: NEGATIVE

## 2021-06-13 LAB — PREPARE RBC (CROSSMATCH)

## 2021-06-13 MED ORDER — NICOTINE 21 MG/24HR TD PT24
21.0000 mg | MEDICATED_PATCH | Freq: Every day | TRANSDERMAL | Status: DC
  Administered 2021-06-13 – 2021-06-20 (×8): 21 mg via TRANSDERMAL
  Filled 2021-06-13 (×8): qty 1

## 2021-06-13 MED ORDER — DOXERCALCIFEROL 4 MCG/2ML IV SOLN
3.0000 ug | INTRAVENOUS | Status: DC
  Administered 2021-06-14 – 2021-06-21 (×4): 3 ug via INTRAVENOUS
  Filled 2021-06-13 (×8): qty 2

## 2021-06-13 MED ORDER — HEPARIN SODIUM (PORCINE) 5000 UNIT/ML IJ SOLN
5000.0000 [IU] | Freq: Three times a day (TID) | INTRAMUSCULAR | Status: DC
  Administered 2021-06-13 – 2021-06-20 (×21): 5000 [IU] via SUBCUTANEOUS
  Filled 2021-06-13 (×22): qty 1

## 2021-06-13 MED ORDER — SODIUM CHLORIDE 0.9% FLUSH
3.0000 mL | Freq: Two times a day (BID) | INTRAVENOUS | Status: DC
  Administered 2021-06-13 – 2021-06-20 (×13): 3 mL via INTRAVENOUS

## 2021-06-13 MED ORDER — OXYCODONE-ACETAMINOPHEN 5-325 MG PO TABS
1.0000 | ORAL_TABLET | Freq: Four times a day (QID) | ORAL | Status: DC | PRN
  Administered 2021-06-13 – 2021-06-18 (×14): 1 via ORAL
  Filled 2021-06-13 (×14): qty 1

## 2021-06-13 MED ORDER — CLONIDINE HCL 0.1 MG PO TABS
0.1000 mg | ORAL_TABLET | ORAL | Status: DC
  Administered 2021-06-13 – 2021-06-20 (×9): 0.1 mg via ORAL
  Filled 2021-06-13 (×9): qty 1

## 2021-06-13 MED ORDER — AMLODIPINE BESYLATE 10 MG PO TABS
10.0000 mg | ORAL_TABLET | Freq: Every day | ORAL | Status: DC
  Administered 2021-06-13 – 2021-06-20 (×8): 10 mg via ORAL
  Filled 2021-06-13: qty 2
  Filled 2021-06-13 (×6): qty 1
  Filled 2021-06-13: qty 2

## 2021-06-13 MED ORDER — SODIUM CHLORIDE 0.9% IV SOLUTION: Freq: Once | INTRAVENOUS | Status: AC

## 2021-06-13 MED ORDER — CHLORHEXIDINE GLUCONATE CLOTH 2 % EX PADS
6.0000 | MEDICATED_PAD | Freq: Every day | CUTANEOUS | Status: DC
  Administered 2021-06-14 – 2021-06-20 (×7): 6 via TOPICAL

## 2021-06-13 MED ORDER — SENNOSIDES-DOCUSATE SODIUM 8.6-50 MG PO TABS
1.0000 | ORAL_TABLET | Freq: Every evening | ORAL | Status: DC | PRN
  Administered 2021-06-20: 1 via ORAL
  Filled 2021-06-13 (×2): qty 1

## 2021-06-13 MED ORDER — ACETAMINOPHEN 325 MG PO TABS
650.0000 mg | ORAL_TABLET | Freq: Four times a day (QID) | ORAL | Status: DC | PRN
  Administered 2021-06-17 – 2021-06-18 (×2): 650 mg via ORAL
  Filled 2021-06-13 (×2): qty 2

## 2021-06-13 MED ORDER — HYDRALAZINE HCL 20 MG/ML IJ SOLN: 10.0000 mg | Freq: Four times a day (QID) | INTRAMUSCULAR | Status: DC | PRN

## 2021-06-13 MED ORDER — GABAPENTIN 100 MG PO CAPS
100.0000 mg | ORAL_CAPSULE | Freq: Three times a day (TID) | ORAL | Status: DC
Start: 2021-06-13 — End: 2021-06-21
  Administered 2021-06-13 – 2021-06-21 (×22): 100 mg via ORAL
  Filled 2021-06-13 (×23): qty 1

## 2021-06-13 MED ORDER — PANTOPRAZOLE SODIUM 40 MG PO TBEC
40.0000 mg | DELAYED_RELEASE_TABLET | Freq: Two times a day (BID) | ORAL | Status: DC
  Administered 2021-06-13 – 2021-06-20 (×14): 40 mg via ORAL
  Filled 2021-06-13 (×15): qty 1

## 2021-06-13 MED ORDER — HYDROCODONE-ACETAMINOPHEN 5-325 MG PO TABS: 1.0000 | ORAL_TABLET | Freq: Four times a day (QID) | ORAL | 0 refills | Status: DC | PRN

## 2021-06-13 MED ORDER — ACETAMINOPHEN 650 MG RE SUPP: 650.0000 mg | Freq: Four times a day (QID) | RECTAL | Status: DC | PRN

## 2021-06-13 MED ORDER — HYDROXYZINE HCL 25 MG PO TABS
25.0000 mg | ORAL_TABLET | Freq: Three times a day (TID) | ORAL | Status: DC | PRN
  Administered 2021-06-13 – 2021-06-19 (×6): 25 mg via ORAL
  Filled 2021-06-13 (×7): qty 1

## 2021-06-13 MED ORDER — SODIUM THIOSULFATE 250 MG/ML IV SOLN
25.0000 g | INTRAVENOUS | Status: DC
  Administered 2021-06-14 – 2021-06-21 (×4): 25 g via INTRAVENOUS
  Filled 2021-06-13 (×6): qty 100

## 2021-06-13 MED ORDER — CLONIDINE HCL 0.1 MG PO TABS
0.1000 mg | ORAL_TABLET | ORAL | Status: DC
  Administered 2021-06-14 – 2021-06-19 (×3): 0.1 mg via ORAL
  Filled 2021-06-13 (×4): qty 1

## 2021-06-13 MED ORDER — RENA-VITE PO TABS
1.0000 | ORAL_TABLET | Freq: Every day | ORAL | Status: DC
  Administered 2021-06-13 – 2021-06-20 (×8): 1 via ORAL
  Filled 2021-06-13 (×8): qty 1

## 2021-06-13 NOTE — ED Notes (Signed)
Transport put in for patient

## 2021-06-13 NOTE — H&P (Signed)
°History and Physical  ° ° °Daniel [name] MRN:[id number] DOB: 11/29/1960 DOA: 06/13/2021 ° °PCP: Edwards, Michelle P, NP  °Patient coming from: ALF ° °I have personally briefly reviewed patient's old medical records in [facility] [facility] ° °Chief Complaint: Chest pain ° °HPI: °Daniel [name] is a 61 y.o. male with medical history significant for ESRD on MWF HD, PAD s/p left BKA (04/16/2021), anemia of chronic kidney disease, HTN, ICH in 2019 who presented to the ED for evaluation of chest pain. ° °Patient reports 3 days of intermittent left-sided chest pain.  Discomfort is described as a squeezing/aching sensation which radiates to his back.  It has been occurring while at rest.  He has had associated shortness of breath and pleuritic chest discomfort.  He has been waking up short of breath from sleep.  Notes that his dyspnea is worse when he is laying flat.  He reports swelling to his right leg which is chronic.  He is still making urine.  He has not had any diaphoresis, nausea, vomiting, abdominal pain. ° °He denies any similar symptoms in the past.  He does report ongoing tobacco use, 0.5-1 PPD.  He denies any alcohol or illicit drug use. ° °ED Course   Labs/Imaging on admission: I have personally reviewed following labs and imaging studies. ° °Initial vitals showed BP 150/79, pulse 84, RR 18, temp 98.4 °F, SPO2 90% on room air. ° °Labs show WBC 5.3, hemoglobin 7.3, platelets 179,000, sodium 139, potassium 3.4, bicarb 28, BUN 18, creatinine 0.86, serum glucose 96, troponin 17 > 18. ° °2 view chest x-ray shows cardiomegaly with mild pulmonary edema and trace bilateral pleural effusions.  Right IJ TDC seen overlying the mid superior vena cava. ° °Nephrology consulted and have placed orders for hemodialysis with transfusion 1 unit PRBC during dialysis.  The hospitalist service was consulted to admit for further evaluation and management. ° °Review of Systems: All systems reviewed and are negative except as  documented in history of present illness above. ° ° °Past Medical History:  °Diagnosis Date  ° Anemia   ° Diabetes mellitus without complication (HCC)   ° patient denies  ° Dialysis patient (HCC)   ° End stage chronic kidney disease (HCC)   ° Hypertension   ° ICH (intracerebral hemorrhage) (HCC) 05/20/2017  ° PAD (peripheral artery disease) (HCC)   ° Shoulder pain, left 06/28/2013  ° ° °Past Surgical History:  °Procedure Laterality Date  ° A/V FISTULAGRAM N/A 08/15/2020  ° Procedure: A/V FISTULAGRAM - Left Upper;  Surgeon: Hawken, Thomas N, MD;  [facility];  Service: Cardiovascular;  Laterality: N/A;  ° ABDOMINAL AORTOGRAM W/LOWER EXTREMITY N/A 04/08/2021  ° Procedure: ABDOMINAL AORTOGRAM W/LOWER EXTREMITY;  Surgeon: Cain, Brandon Christopher, MD;  [facility];  Service: Cardiovascular;  Laterality: N/A;  ° AMPUTATION Left 04/16/2021  ° Procedure: LEFT BELOW KNEE AMPUTATION;  Surgeon: Dickson, Christopher S, MD;  [facility]: [facility];  Service: Vascular;  Laterality: Left;  ° APPENDECTOMY    ° AV FISTULA PLACEMENT Left 08/03/2019  ° Procedure: LEFT ARM ARTERIOVENOUS (AV) CEPHALIC  FISTULA CREATION;  Surgeon: Cain, Brandon Christopher, MD;  [facility];  Service: Vascular;  Laterality: Left;  ° BIOPSY  06/30/2019  ° Procedure: BIOPSY;  Surgeon: Buccini, Robert, MD;  [facility];  Service: Endoscopy;;  ° BIOPSY  08/02/2019  ° Procedure: BIOPSY;  Surgeon: Armbruster, Steven P, MD;  [facility];  Service: Gastroenterology;;  ° BIOPSY  02/08/2021  °   Procedure: BIOPSY;  Surgeon: Dorsey, Ying C, MD;  [facility];  Service: Gastroenterology;;  ° BIOPSY  02/24/2021  ° Procedure: BIOPSY;  Surgeon: Cunningham, Scott E, MD;  [facility];  Service: Gastroenterology;;  ° BIOPSY  03/26/2021  ° Procedure: BIOPSY;  Surgeon: Cirigliano, Vito V, DO;  [facility];  Service: Gastroenterology;;  ° COLONOSCOPY  01/23/2012  ° Procedure: COLONOSCOPY;  Surgeon:  Sandi L Fields, MD;  [facility];  Service: Endoscopy;  Laterality: N/A;  11:10 AM  ° COLONOSCOPY WITH PROPOFOL N/A 06/30/2019  ° Procedure: COLONOSCOPY WITH PROPOFOL;  Surgeon: Buccini, Robert, MD;  [facility];  Service: Endoscopy;  Laterality: N/A;  ° ENTEROSCOPY N/A 08/02/2019  ° Procedure: ENTEROSCOPY;  Surgeon: Armbruster, Steven P, MD;  [facility];  Service: Gastroenterology;  Laterality: N/A;  ° ENTEROSCOPY N/A 02/24/2021  ° Procedure: ENTEROSCOPY;  Surgeon: Cunningham, Scott E, MD;  [facility];  Service: Gastroenterology;  Laterality: N/A;  ° ENTEROSCOPY N/A 03/26/2021  ° Procedure: ENTEROSCOPY;  Surgeon: Cirigliano, Vito V, DO;  [facility];  Service: Gastroenterology;  Laterality: N/A;  ° ESOPHAGOGASTRODUODENOSCOPY N/A 08/10/2020  ° Procedure: ESOPHAGOGASTRODUODENOSCOPY (EGD);  Surgeon: Pyrtle, Jay M, MD;  [facility];  Service: Gastroenterology;  Laterality: N/A;  ° ESOPHAGOGASTRODUODENOSCOPY (EGD) WITH PROPOFOL N/A 06/30/2019  ° Procedure: ESOPHAGOGASTRODUODENOSCOPY (EGD) WITH PROPOFOL;  Surgeon: Buccini, Robert, MD;  [facility];  Service: Endoscopy;  Laterality: N/A;  ° ESOPHAGOGASTRODUODENOSCOPY (EGD) WITH PROPOFOL N/A 01/12/2021  ° Procedure: ESOPHAGOGASTRODUODENOSCOPY (EGD) WITH PROPOFOL;  Surgeon: Dorsey, Ying C, MD;  [facility];  Service: Gastroenterology;  Laterality: N/A;  ° ESOPHAGOGASTRODUODENOSCOPY (EGD) WITH PROPOFOL N/A 02/08/2021  ° Procedure: ESOPHAGOGASTRODUODENOSCOPY (EGD) WITH PROPOFOL;  Surgeon: Dorsey, Ying C, MD;  [facility];  Service: Gastroenterology;  Laterality: N/A;  ° FISTULA SUPERFICIALIZATION Left 10/17/2019  ° Procedure: LEFT UPPER EXTREMITY FISTULA REVISION, SIDE BRANCH LIGATION,  AND SUPERFICIALIZATION;  Surgeon: [name], Christopher J, MD;  [facility];  Service: Vascular;  Laterality: Left;  ° FISTULA SUPERFICIALIZATION Left 04/19/2021  ° Procedure: PLICATION OF ANEURYSM  LEFT ARTERIOVENOUS FISTULA;  Surgeon: Dickson, Christopher S, MD;  [facility];  Service: Vascular;  Laterality: Left;  ° GIVENS CAPSULE STUDY N/A 06/30/2019  ° Procedure: GIVENS CAPSULE STUDY;  Surgeon: Buccini, Robert, MD;  [facility];  Service: Endoscopy;  Laterality: N/A;  ° HEMOSTASIS CLIP PLACEMENT  08/10/2020  ° Procedure: HEMOSTASIS CLIP PLACEMENT;  Surgeon: Pyrtle, Jay M, MD;  [facility];  Service: Gastroenterology;;  ° HEMOSTASIS CLIP PLACEMENT  01/12/2021  ° Procedure: HEMOSTASIS CLIP PLACEMENT;  Surgeon: Dorsey, Ying C, MD;  [facility];  Service: Gastroenterology;;  ° HEMOSTASIS CONTROL  08/02/2019  ° Procedure: HEMOSTASIS CONTROL;  Surgeon: Armbruster, Steven P, MD;  [facility];  Service: Gastroenterology;;  ° HOT HEMOSTASIS  02/24/2021  ° Procedure: HOT HEMOSTASIS (ARGON PLASMA COAGULATION/BICAP);  Surgeon: Cunningham, Scott E, MD;  [facility];  Service: Gastroenterology;;  ° HOT HEMOSTASIS N/A 03/26/2021  ° Procedure: HOT HEMOSTASIS (ARGON PLASMA COAGULATION/BICAP);  Surgeon: Cirigliano, Vito V, DO;  [facility];  Service: Gastroenterology;  Laterality: N/A;  ° INCISION AND DRAINAGE ABSCESS N/A 06/29/2016  ° Procedure: INCISION AND DRAINAGE ABDOMINAL WALL ABSCESS;  Surgeon: David Newman, MD;  Location: [facility];  Service: General;  Laterality: N/A;  ° INSERTION OF DIALYSIS CATHETER Right 08/03/2019  ° Procedure: INSERTION OF DIALYSIS CATHETER;  Surgeon: Cain, Brandon Christopher, MD;  [facility];  Service: Vascular;    Laterality: Right;  ° INSERTION OF DIALYSIS CATHETER Right 10/22/2019  ° Procedure: INSERTION OF 23CM TUNNELED DIALYSIS CATHETER RIGHT INTERNAL JUGULAR;  Surgeon: Dickson, Christopher S, MD;  [facility];  Service: Vascular;  Laterality: Right;  ° INSERTION OF DIALYSIS CATHETER Right 08/12/2020  ° Procedure: INSERTION OF Right internal Jugular TUNNELED  DIALYSIS CATHETER.;  Surgeon: Cain, Brandon Christopher, MD;   [facility];  Service: Vascular;  Laterality: Right;  ° INSERTION OF DIALYSIS CATHETER N/A 04/19/2021  ° Procedure: INSERTION OF TUNNELED DIALYSIS CATHETER;  Surgeon: Dickson, Christopher S, MD;  [facility];  Service: Vascular;  Laterality: N/A;  ° Left heel surgery    ° PENILE BIOPSY N/A 03/26/2020  ° Procedure: PENILE ULCER DEBRIDEMENT;  Surgeon: Newsome, George B, MD;  Location: [facility];  Service: Urology;  Laterality: N/A;  30 MINS  ° SCLEROTHERAPY  01/12/2021  ° Procedure: SCLEROTHERAPY;  Surgeon: Dorsey, Ying C, MD;  [facility];  Service: Gastroenterology;;  ° SCLEROTHERAPY  02/24/2021  ° Procedure: SCLEROTHERAPY;  Surgeon: Cunningham, Scott E, MD;  [facility];  Service: Gastroenterology;;  ° ° °Social History: ° reports that he has been smoking cigarettes. He has a 22.50 pack-year smoking history. He has never used smokeless tobacco. He reports that he does not currently use alcohol after a past usage of about 12.0 standard drinks per week. He reports that he does not currently use drugs after having used the following drugs: "Crack" cocaine. ° °Allergies  °Allergen Reactions  ° Seroquel [Quetiapine] Other (See Comments)  °  Tardive kinesia/dystonia  ° Dilaudid [Hydromorphone Hcl] Itching and Other (See Comments)  °  Pt reports itchiness after IM injection   ° ° °Family History  °Problem Relation Age of Onset  ° Colon cancer Neg Hx   ° ° ° °Prior to Admission medications   °Medication Sig Start Date End Date Taking? Authorizing Provider  °amLODipine (NORVASC) 10 MG tablet Take 1 tablet (10 mg total) by mouth at bedtime. 03/23/21   Edwards, Michelle P, NP  °blood glucose meter kit and supplies KIT Dispense based on patient and insurance preference. Use up to four times daily as directed. (FOR ICD-9 250.00, 250.01). 07/01/16   Samtani, Jai-Gurmukh, MD  °calcium acetate (PHOSLO) 667 MG capsule Take 1,334 mg by mouth 3 (three) times daily with meals. 03/14/21   [provider]  °cloNIDine (CATAPRES) 0.1 MG tablet Take 0.1 mg by mouth as directed. TAKE 1 TABLET BID BUT ON DIALYSIS DAYS TAKE ONLY AT BEDTIME. 03/13/21   [provider]  °gabapentin (NEURONTIN) 100 MG capsule Take 1 capsule (100 mg total) by mouth 3 (three) times daily. 04/09/21   Cain, Brandon Christopher, MD  °HYDROcodone-acetaminophen (NORCO) 5-325 MG tablet Take 1 tablet by mouth every 6 (six) hours as needed for moderate pain. 06/13/21   Rhyne, Samantha J, PA-C  °hydrOXYzine (ATARAX) 25 MG tablet TAKE 1 TABLET BY MOUTH EVERY 8 HOURS AS NEEDED FOR ANXIETY OR  ITCHING °Strength: 25 mg 04/23/21   Krishnan, Gokul, MD  °lidocaine-prilocaine (EMLA) cream Apply 1 application topically once. 12/27/20   [provider]  °loperamide (IMODIUM) 2 MG capsule Take 2 capsules (4 mg total) by mouth 3 (three) times daily as needed for diarrhea or loose stools. 04/23/21   Krishnan, Gokul, MD  °multivitamin (RENA-VIT) TABS tablet Take 1 tablet by mouth at bedtime. 04/23/21   Krishnan, Gokul, MD  °oxyCODONE-acetaminophen (PERCOCET/ROXICET) 5-325 MG tablet Take 1 tablet by mouth every 6 (six) hours as   needed for severe pain. 05/30/21   Baglia, Corrina, PA-C  °pantoprazole (PROTONIX) 40 MG tablet Take 1 tablet (40 mg total) by mouth 2 (two) times daily. 02/28/21   Feliz Ortiz, Abraham, MD  °saccharomyces boulardii (FLORASTOR) 250 MG capsule Take 1 capsule (250 mg total) by mouth 2 (two) times daily. 04/23/21   Krishnan, Gokul, MD  °colchicine 0.6 MG tablet Take 0.5 tablets (0.3 mg total) by mouth 2 (two) times daily. 07/24/20 07/29/20  Miller, Brian, MD  °ferrous sulfate 325 (65 FE) MG tablet SMARTSIG:1 Tablet(s) By Mouth Twice Daily 08/11/19 02/09/20  [provider]  °furosemide (LASIX) 40 MG tablet Take 1 tablet (40 mg total) by mouth daily. 07/25/19 02/09/20  Jones, Ellen, MD  ° ° °Physical Exam: °Vitals:  ° 06/13/21 1845 06/13/21 1900 06/13/21 1935 06/13/21 2000  °BP: (!) 166/87  (!) 149/85 (!) 160/81  °Pulse: 89 87 88  88  °Resp: (!) 24 18 16 16  °Temp:   97.9 °F (36.6 °C)   °TempSrc:   Oral   °SpO2: 94% 96% 92% 92%  ° °Constitutional: Resting in bed, NAD, calm, comfortable °Eyes: PERRL, lids and conjunctivae normal °ENMT: Mucous membranes are moist. Posterior pharynx clear of any exudate or lesions.Normal dentition.  °Neck: normal, supple, no masses. °Respiratory: Inspiratory crackles. Normal respiratory effort. No accessory muscle use.  °Cardiovascular: Regular rate and rhythm, systolic flow murmur present.  +2 right lower extremity edema.  °Abdomen: no tenderness, no masses palpated. No hepatosplenomegaly. Bowel sounds positive.  °Musculoskeletal: S/p left BKA.  ROM intact. °Skin: Left BKA surgical wound with large black eschar as pictured below (pictures obtained in vascular surgery office same day prior to admission) °Neurologic: CN 2-12 grossly intact. Sensation intact. Strength equal throughout. °Psychiatric: Alert and oriented x 3. Normal mood.  ° ° ° ° ° °EKG: Personally reviewed. Normal sinus rhythm without acute ischemic changes.  Similar to prior. ° °Assessment/Plan °Principal Problem: °  Chest pain °Active Problems: °  Anemia due to chronic kidney disease °  End-stage renal disease on hemodialysis (HCC) °  Essential hypertension °  PAD (peripheral artery disease) (HCC) °  Tobacco use °  °Daniel [name] is a 61 y.o. male with medical history significant for ESRD on MWF HD, PAD s/p left BKA (04/16/2021), anemia of chronic kidney disease, HTN, ICH in 2019 who is admitted for chest pain evaluation. ° °Assessment and Plan: °* Chest pain- (present on admission) °Patient with 3 days intermittent atypical chest pain.  Troponin without significant elevation, 17 and 18 on repeat.  EKG without acute ischemic changes.  Has pulmonary edema and peripheral edema suggestive of underlying CHF which is likely contributing to presenting symptoms.  Also may be related to worsening anemia. °-Obtain echocardiogram °-Monitor on  telemetry °-Volume control with HD °-Will receive 1 unit PRBC transfusion with HD °-Check RLE venous ultrasound to assess for DVT ° °Anemia due to chronic kidney disease- (present on admission) °Hemoglobin 7.3 on admission, baseline usually 8-9.  No obvious bleeding.  FOBT is negative.  Transfusing 1 unit PRBC with HD per nephrology. ° °End-stage renal disease on hemodialysis (HCC) °Nephrology following for HD needs. ° °Essential hypertension- (present on admission) °Continue clonidine and amlodipine.  IV hydralazine as needed. ° °PAD (peripheral artery disease) (HCC)- (present on admission) °S/p left BKA 04/16/2021 by vascular surgery, Dr. Dickson, for nonhealing left foot wound.  Patient having continued pain at stump and vascular surgery considering revision pending further chest pain work-up. ° °Tobacco use- (present on admission) °  Reports ongoing tobacco use, smoking 0.5-1 pack/day.  Smoking cessation advised.  Nicotine patch ordered.  DVT prophylaxis: Subcutaneous heparin Code Status: Full code, confirmed on admission Family Communication: Discussed with patient, he has discussed with family Disposition Plan: From nursing facility and likely discharge to prior facility pending clinical progress Consults called: Nephrology Severity of Illness: The appropriate patient status for this patient is OBSERVATION. Observation status is judged to be reasonable and necessary in order to provide the required intensity of service to ensure the patient's safety. The patient's presenting symptoms, physical exam findings, and initial radiographic and laboratory data in the context of their medical condition is felt to place them at decreased risk for further clinical deterioration. Furthermore, it is anticipated that the patient will be medically stable for discharge from the hospital within 2 midnights of admission.   Zada Finders MD [facility]  If 7PM-7AM, please contact  night-coverage [email or web address]  06/13/2021, 8:23 PM

## 2021-06-13 NOTE — ED Provider Triage Note (Signed)
Emergency Medicine Provider Triage Evaluation Note  Daniel [name] , a 61 y.o. male  was evaluated in triage.  Pt complains of chest pain.  Reports that chest pain has been intermittent over the last 3 days.  Patient denies any aggravating factors and states the pain comes on randomly.  Last episode of pain started approximately 45 to 60 minutes prior while at doctor's office.  Patient reports that pain is to his left chest and radiates to his back.  Patient describes pain as sharp.  Denies any associated nausea, vomiting, diaphoresis, shortness of breath, lightheadedness, syncope.  Patient reports that pain is worse when he takes deep breaths.  Additionally patient endorses productive cough over the last 4 to 5 days.    Review of Systems  Positive: Productive cough, chest pain Negative: Nausea, vomiting, diaphoresis, shortness of breath, palpitations, lightheadedness, syncope  Physical Exam  BP (!) 150/79    Pulse 84    Temp 98.4 F (36.9 C) (Oral)    Resp 18    SpO2 90%  Gen:   Awake, no distress   Resp:  Normal effort; Clear to auscultation bilaterally MSK:   Moves extremities without difficulty; left BKA.  Patient has trace edema to right lower extremity Other:  +2 radial pulse bilaterally  Medical Decision Making  Medically screening exam initiated at 11:08 AM.  Appropriate orders placed.  Daniel [name] was informed that the remainder of the evaluation will be completed by another provider, this initial triage assessment does not replace that evaluation, and the importance of remaining in the ED until their evaluation is complete.     Loni Beckwith, [place] 06/13/21 1110

## 2021-06-13 NOTE — Assessment & Plan Note (Addendum)
Plan to continue with hemodialysis.  Removed tunneled HD cathter (06/20/21) and continue HD per fistula.  Currently using AVF fistula for hemodialysis.

## 2021-06-13 NOTE — Hospital Course (Addendum)
Daniel [name] was admitted to the hospital with the working diagnosis of chest pain in the setting of decompensated heart failure.   Daniel [name] is a 61 y.o. male with past medical history significant for ESRD on HD MWF, PAD s/p left BKA (04/16/2021), anemia of chronic kidney disease, HTN, ICH in 2019 who presented to [facility] ED on 2/16 from vascular surgery outpatient office for evaluation of chest pain.  Patient reports 3 days of intermittent left-sided chest pain described as a squeezing/aching sensation with radiation to his back.  Onset at rest and continues to occur at rest with associated shortness of breath and pleuritic chest discomfort.  Notes that his dyspnea is worse when he is lying flat and reports swelling to his right leg which is chronic.  Denies diaphoresis, no nausea/vomiting, no abdominal pain.  Denies any similar symptoms in the past.  Continues to endorse tobacco use, 0.5-1 pack/day but denies any alcohol or illicit drug use.  In the ED, BP 150/79, HR 84, RR 18, temperature 98.4 F, SPO2 90% on room air.   Lungs had bilateral rales, heart with S1 and S2 present and rhythmic, no gallops, positive systolic murmur, abdomen soft and positive right lower extremity edema ++/ Left BKA with signs of local infection   WBC 5.3, hemoglobin 7.3, platelets 179,  sodium 139, potassium 3.4, bicarb 28, BUN 18, creatinine 7.86, glucose 96, high sensitive troponin 17>18.   Chest x-ray with cardiomegaly, mild pulmonary edema and trace bilateral pleural effusions.    Nephrology consulted for dialysis while inpatient with recommendation of 1 unit PRBC during HD.   Vascular surgery was consulted.  02/20 left below the knee washout and debridement.  Plan to continue with short course of antibiotic Plant to return to SNF [facility] after hemodialysis on Friday 02/24

## 2021-06-13 NOTE — Assessment & Plan Note (Addendum)
SP one unit PRBC transfusion.  No clinical signs of bleeding.  Continue oral iron supplementation for iron deficiency anemia.   At his discharge his hgb is 7,6 with hct at 24,1.

## 2021-06-13 NOTE — Assessment & Plan Note (Addendum)
Local infection the left AKA, sp debridement and wound vac placement.   On cephalexin 500 mg  p.o. every 8 hours x7 days per vascular surgery recommendations Out patient follow up with vascular surgery, continue with wound vac.  Plan for wound vac exchange on 06/21/21.

## 2021-06-13 NOTE — Assessment & Plan Note (Addendum)
Smoking cessation counseling 

## 2021-06-13 NOTE — ED Notes (Addendum)
Per EDP and nephrology not transfusing pt today - transfusing pt tomorrow with dialysis

## 2021-06-13 NOTE — ED Notes (Signed)
Pt signed consent for dialysis at bedside

## 2021-06-13 NOTE — ED Triage Notes (Addendum)
EMS stated,, he has had chest pain for 3 days. He was picked up at Dr's office.  20 g in rt. Forearm 324 ASA  Nitro -one -no relief

## 2021-06-13 NOTE — Assessment & Plan Note (Addendum)
Ruled out for acute coronary syndrome.   Cardiology will follow up as outpatient coronary CTA and follow-up with Dr. Sallyanne Kuster in 1-2 weeks.  Plan to continue blood pressure control.

## 2021-06-13 NOTE — ED Notes (Signed)
Dr. Patel at bedside 

## 2021-06-13 NOTE — ED Provider Notes (Addendum)
[facility] EMERGENCY DEPARTMENT Provider Note   CSN: [id number] Arrival date & time: 06/13/21  1039     History  Chief Complaint  Patient presents with   Chest Pain    Daniel [name] is a 61 y.o. male.  Patient is dialysis patient.  Normally dialyzed Monday Wednesdays and Fridays.  Was dialyzed yesterday.  Patient is also status post left below the knee amputation which she is stating that Dr. Doren Custard is going to do a revision on.  Has a history of diabetes.  He has had intermittent chest pain for the past 2 days.  Left anterior chest.  When present normally there for an hour.  Is not currently having chest pain but has had chest pain since he arrived.  EMS gave him aspirin and nitro without any change.  He was picked up at his doctor's office.  Patient does not have a past history of chest pain.  This is a new thing the past 2 days.  Not followed by cardiology.  Chart review implies that patient is followed by the wellness clinic.  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      Home Medications Prior to Admission medications   Medication Sig Start Date End Date Taking? Authorizing Provider  amLODipine (NORVASC) 10 MG tablet Take 1 tablet (10 mg total) by mouth at bedtime. 03/23/21   [name], NP  blood glucose meter kit and supplies KIT Dispense based on patient and insurance preference. Use up to four times daily as directed. (FOR ICD-9 250.00, 250.01). 07/01/16   Nita Sells, MD  calcium acetate (PHOSLO) 667 MG capsule Take 1,334 mg by mouth 3 (three) times daily with meals. 03/14/21   [provider]  cloNIDine (CATAPRES) 0.1 MG tablet Take 0.1 mg by mouth as directed. TAKE 1 TABLET BID BUT ON DIALYSIS DAYS TAKE ONLY AT BEDTIME. 03/13/21   [provider]  gabapentin (NEURONTIN) 100 MG capsule Take  1 capsule (100 mg total) by mouth 3 (three) times daily. 04/09/21   Waynetta Sandy, MD  HYDROcodone-acetaminophen [facility]) 5-325 MG tablet Take 1 tablet by mouth every 6 (six) hours as needed for moderate pain. 06/13/21   Rhyne, Hulen Shouts, PA-C  hydrOXYzine (ATARAX) 25 MG tablet TAKE 1 TABLET BY MOUTH EVERY 8 HOURS AS NEEDED FOR ANXIETY OR  ITCHING Strength: 25 mg 04/23/21   Bonnielee Haff, MD  lidocaine-prilocaine (EMLA) cream Apply 1 application topically once. 12/27/20   [provider]  loperamide (IMODIUM) 2 MG capsule Take 2 capsules (4 mg total) by mouth 3 (three) times daily as needed for diarrhea or loose stools. 04/23/21   Bonnielee Haff, MD  multivitamin (RENA-VIT) TABS tablet Take 1 tablet by mouth at bedtime. 04/23/21   Bonnielee Haff, MD  oxyCODONE-acetaminophen (PERCOCET/ROXICET) 5-325 MG tablet Take 1 tablet by mouth every 6 (six) hours as needed for severe pain. 05/30/21   Baglia, Corrina, PA-C  pantoprazole (PROTONIX) 40 MG tablet Take 1 tablet (40 mg total) by mouth 2 (two) times daily. 02/28/21   Charlynne Cousins, MD  saccharomyces boulardii (FLORASTOR) 250 MG capsule Take 1 capsule (250 mg total) by mouth 2 (two) times daily. 04/23/21   Bonnielee Haff, MD  colchicine 0.6 MG tablet Take 0.5 tablets (0.3 mg total) by mouth 2 (two) times daily. 07/24/20 07/29/20  Noemi Chapel, MD  ferrous sulfate 325 (65 FE) MG tablet SMARTSIG:1  Tablet(s) By Mouth Twice Daily 08/11/19 02/09/20  [provider]  furosemide (LASIX) 40 MG tablet Take 1 tablet (40 mg total) by mouth daily. 07/25/19 02/09/20  Ladona Horns, MD      Allergies    Seroquel [quetiapine] and Dilaudid [hydromorphone hcl]    Review of Systems   Review of Systems  Constitutional:  Negative for chills and fever.  HENT:  Negative for ear pain and sore throat.   Eyes:  Negative for pain and visual disturbance.  Respiratory:  Negative for cough and shortness of breath.   Cardiovascular:  Positive  for chest pain. Negative for palpitations.  Gastrointestinal:  Negative for abdominal pain and vomiting.  Genitourinary:  Negative for dysuria and hematuria.  Musculoskeletal:  Negative for arthralgias and back pain.  Skin:  Negative for color change and rash.  Neurological:  Negative for seizures and syncope.  Hematological:  Bruises/bleeds easily.  All other systems reviewed and are negative.  Physical Exam Updated Vital Signs BP (!) 149/102    Pulse 86    Temp 97.9 F (36.6 C) (Oral)    Resp 17    SpO2 98%  Physical Exam Vitals and nursing note reviewed.  Constitutional:      General: He is not in acute distress.    Appearance: Normal appearance. He is well-developed.  HENT:     Head: Normocephalic and atraumatic.  Eyes:     Conjunctiva/sclera: Conjunctivae normal.     Pupils: Pupils are equal, round, and reactive to light.  Cardiovascular:     Rate and Rhythm: Normal rate and regular rhythm.     Heart sounds: No murmur heard. Pulmonary:     Effort: Pulmonary effort is normal. No respiratory distress.     Breath sounds: Normal breath sounds.  Abdominal:     Palpations: Abdomen is soft.     Tenderness: There is no abdominal tenderness.  Genitourinary:    Comments: Rectal exam with light brown stool.  No gross blood.  Hemoccult pending Musculoskeletal:        General: No swelling.     Cervical back: Normal range of motion and neck supple.     Comments: Patient with AV fistula left upper extremity with good thrill.  Skin:    General: Skin is warm and dry.     Capillary Refill: Capillary refill takes less than 2 seconds.  Neurological:     General: No focal deficit present.     Mental Status: He is alert and oriented to person, place, and time.  Psychiatric:        Mood and Affect: Mood normal.    ED Results / Procedures / Treatments   Labs (all labs ordered are listed, but only abnormal results are displayed) Labs Reviewed  COMPREHENSIVE METABOLIC PANEL - Abnormal;  Notable for the following components:      Result Value   Potassium 3.4 (*)    Chloride 97 (*)    Creatinine, Ser 7.86 (*)    Total Protein 8.4 (*)    Albumin 2.5 (*)    GFR, Estimated 7 (*)    All other components within normal limits  CBC WITH DIFFERENTIAL/PLATELET - Abnormal; Notable for the following components:   RBC 2.17 (*)    Hemoglobin 7.3 (*)    HCT 23.4 (*)    MCV 107.8 (*)    RDW 16.6 (*)    All other components within normal limits  TROPONIN I (HIGH SENSITIVITY) - Abnormal; Notable for the following components:  Troponin I (High Sensitivity) 18 (*)    All other components within normal limits  POC OCCULT BLOOD, ED  TYPE AND SCREEN  TROPONIN I (HIGH SENSITIVITY)    EKG EKG Interpretation  Date/Time:  Thursday June 13 2021 10:45:07 EST Ventricular Rate:  86 PR Interval:  174 QRS Duration: 80 QT Interval:  410 QTC Calculation: 490 R Axis:   -3 Text Interpretation: Normal sinus rhythm Inferior infarct , age undetermined Anteroseptal infarct , age undetermined Abnormal ECG When compared with ECG of 14-Apr-2021 13:57, No significant change since last tracing PREVIOUS ECG IS PRESENT Confirmed by Fredia Sorrow [phone]) on 06/13/2021 5:08:18 PM  Radiology DG Chest 2 View  Result Date: 06/13/2021 CLINICAL DATA:  Chest pain EXAM: CHEST - 2 VIEW COMPARISON:  None. FINDINGS: Right neck catheter tip overlies the mid superior vena cava. Unchanged, enlarged cardiac silhouette. There are diffuse interstitial opacities. There are lower lung alveolar opacities. Trace effusions. No visible pneumothorax. No acute osseous abnormality. Left axillary stent. IMPRESSION: Mild pulmonary edema and trace bilateral pleural effusions. Cardiomegaly. Electronically Signed   By: Maurine Simmering M.D.   On: 06/13/2021 11:31    Procedures Procedures    Medications Ordered in ED Medications - No data to display  ED Course/ Medical Decision Making/ A&P                           Medical  Decision Making Amount and/or Complexity of Data Reviewed Labs: ordered.  Risk Decision regarding hospitalization.   EKG showed no significant change from previous.  No evidence of any acute infarct.  Troponins here were 17 and 18.  Patient's chest pain is intermittent.  Complete metabolic panel significant for potassium of 3.4.  Liver function test without any significant abnormalities.  CBC White blood cell count 5.3.  Hemoglobin 7.3.  Which is low for him.  The lowest that we have his back when he had his below the knee amputation in December.  At the end of December he was in the 8 range.  Platelets are normal at 179.  Hemoccult is pending.  Patient denies any gross blood in his bowel movements.  Chest x-ray shows mild pulmonary edema and trace bilateral pleural effusions.  I feel patient needs admission for continued troponins and cardiac monitoring for the intermittent chest pain.  Patient's hemoglobin being low could be contributing to the chest pain.  Discussed with nephrology Dr. Posey Pronto.  He is recommending holding on transfusion till tomorrow we will do it while they dialyze him.  Patient primary care provider is [name].  Chart review shows that he has been being followed by the wellness clinic.  Do not have her on the list as being covered by hospitalist.  Will contact unassigned for admission.   Final Clinical Impression(s) / ED Diagnoses Final diagnoses:  Precordial pain  ESRD on dialysis ([place])  Anemia, unspecified type    Rx / DC Orders ED Discharge Orders     None         Fredia Sorrow, MD 06/13/21 1839    Fredia Sorrow, MD 06/13/21 Precious Reel, MD 06/13/21 1946

## 2021-06-13 NOTE — Assessment & Plan Note (Addendum)
Patient was continued with amlodipine and clonidine with improvement in blood pressure control.

## 2021-06-13 NOTE — ED Notes (Signed)
Pt placed on 2L [place]

## 2021-06-13 NOTE — Progress Notes (Signed)
POST OPERATIVE OFFICE NOTE    CC:  F/u for surgery  HPI:  This is a 61 y.o. male with ESRD and PAD.  He presented with bilateral foot pain in early December.  He underwent aortogram on 04/08/2021 and he did not have any revascularization options.  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 Scot Dock on 04/19/2021.    Pt was last seen on 05/30/2021 and at that time, he was having continued pain in his left BKA stump.  He was not having any drainage or fever.  He was at high risk for not healing.   Pt returns today for follow up.  Pt states he is having increasing pain in the stump above the eschar and below it.  There is minimal drainage on the bandage.  He has not had fevers.  After discussion, pt wants to proceed with revision of stump.    He then states he has had chest and back pain the past couple of days that comes and goes.  He states he was getting out of bed this morning when it happened.  He states "it didn't last long, maybe a couple of hours".  He is not currently having chest pain.    While waiting to set up cardiology clearance for amputation revision, pt began to have chest pain again but resolved.    Allergies  Allergen Reactions   Seroquel [Quetiapine] Other (See Comments)    Tardive kinesia/dystonia   Dilaudid [Hydromorphone Hcl] Itching and Other (See Comments)    Pt reports itchiness after IM injection     Current Outpatient Medications  Medication Sig Dispense Refill   HYDROcodone-acetaminophen (NORCO) 5-325 MG tablet Take 1 tablet by mouth every 6 (six) hours as needed for moderate pain. 10 tablet 0   amLODipine (NORVASC) 10 MG tablet Take 1 tablet (10 mg total) by mouth at bedtime. 90 tablet 0   blood glucose meter kit and supplies KIT Dispense based on patient and  insurance preference. Use up to four times daily as directed. (FOR ICD-9 250.00, 250.01). 1 each 0   calcium acetate (PHOSLO) 667 MG capsule Take 1,334 mg by mouth 3 (three) times daily with meals.     cloNIDine (CATAPRES) 0.1 MG tablet Take 0.1 mg by mouth as directed. TAKE 1 TABLET BID BUT ON DIALYSIS DAYS TAKE ONLY AT BEDTIME.     gabapentin (NEURONTIN) 100 MG capsule Take 1 capsule (100 mg total) by mouth 3 (three) times daily. 90 capsule 3   hydrOXYzine (ATARAX) 25 MG tablet TAKE 1 TABLET BY MOUTH EVERY 8 HOURS AS NEEDED FOR ANXIETY OR  ITCHING Strength: 25 mg 30 tablet 0   lidocaine-prilocaine (EMLA) cream Apply 1 application topically once.     loperamide (IMODIUM) 2 MG capsule Take 2 capsules (4 mg total) by mouth 3 (three) times daily as needed for diarrhea or loose stools. 30 capsule 0   multivitamin (RENA-VIT) TABS tablet Take 1 tablet by mouth at bedtime.  0   oxyCODONE-acetaminophen (PERCOCET/ROXICET) 5-325 MG tablet Take 1 tablet by mouth every 6 (six) hours as needed for severe pain. 20 tablet 0   pantoprazole (PROTONIX) 40 MG tablet Take 1 tablet (40 mg total) by mouth 2 (two) times daily. 60 tablet 2  saccharomyces boulardii (FLORASTOR) 250 MG capsule Take 1 capsule (250 mg total) by mouth 2 (two) times daily.     No current facility-administered medications for this visit.     ROS:  See HPI  Physical Exam:   Incision:       Extremities:  left arm fistula with pulsatile thrill.    Assessment/Plan:  This is a 61 y.o. male who is s/p: left BKA on 04/16/2021 by Dr. Scot Dock and plication of the fistula and [facility] placement also by Dr. Scot Dock on 04/19/2021.    Left below knee Amputation -pt continues to have pain in stump and wants to proceed with revision.  He does not want to continue to monitor for healing.   Pt was given paper Rx for Norco 5/325 one po q6h prn pain #10 no refill.  PDMP reviewed.  Chest pain -discussed with pt that given he has had chest pain, we  were going to send him for cardiology evaluation prior to proceeding, however, he developed chest pain again today while in our office.  Plan for EMS to take pt to hospital for evaluation.   If pt gets admitted, we can consult on pt and plan for revision while hospitalized pending cardiology workup.   ESRD -pt fistula can be accessed on 06/14/2021.  Once the fistula is being used without difficulty, the catheter can be scheduled to be removed.  Okay for benadryl for itching.   Leontine Locket, [facility] [phone]   Clinic MD:  Trula Slade

## 2021-06-14 ENCOUNTER — Encounter (HOSPITAL_COMMUNITY): Payer: Self-pay | Admitting: Internal Medicine

## 2021-06-14 ENCOUNTER — Inpatient Hospital Stay (HOSPITAL_COMMUNITY): Payer: Medicare Other

## 2021-06-14 ENCOUNTER — Observation Stay (HOSPITAL_COMMUNITY): Payer: Medicare Other

## 2021-06-14 DIAGNOSIS — I724 Aneurysm of artery of lower extremity: Secondary | ICD-10-CM | POA: Diagnosis not present

## 2021-06-14 DIAGNOSIS — R079 Chest pain, unspecified: Secondary | ICD-10-CM | POA: Diagnosis not present

## 2021-06-14 DIAGNOSIS — N2581 Secondary hyperparathyroidism of renal origin: Secondary | ICD-10-CM | POA: Diagnosis present

## 2021-06-14 DIAGNOSIS — I82401 Acute embolism and thrombosis of unspecified deep veins of right lower extremity: Secondary | ICD-10-CM | POA: Diagnosis not present

## 2021-06-14 DIAGNOSIS — K219 Gastro-esophageal reflux disease without esophagitis: Secondary | ICD-10-CM | POA: Diagnosis present

## 2021-06-14 DIAGNOSIS — R072 Precordial pain: Secondary | ICD-10-CM | POA: Diagnosis present

## 2021-06-14 DIAGNOSIS — N186 End stage renal disease: Secondary | ICD-10-CM | POA: Diagnosis present

## 2021-06-14 DIAGNOSIS — Z6836 Body mass index (BMI) 36.0-36.9, adult: Secondary | ICD-10-CM | POA: Diagnosis not present

## 2021-06-14 DIAGNOSIS — I132 Hypertensive heart and chronic kidney disease with heart failure and with stage 5 chronic kidney disease, or end stage renal disease: Secondary | ICD-10-CM | POA: Diagnosis present

## 2021-06-14 DIAGNOSIS — I5033 Acute on chronic diastolic (congestive) heart failure: Secondary | ICD-10-CM | POA: Diagnosis present

## 2021-06-14 DIAGNOSIS — Z992 Dependence on renal dialysis: Secondary | ICD-10-CM | POA: Diagnosis not present

## 2021-06-14 DIAGNOSIS — Z8673 Personal history of transient ischemic attack (TIA), and cerebral infarction without residual deficits: Secondary | ICD-10-CM | POA: Diagnosis not present

## 2021-06-14 DIAGNOSIS — I2510 Atherosclerotic heart disease of native coronary artery without angina pectoris: Secondary | ICD-10-CM | POA: Diagnosis present

## 2021-06-14 DIAGNOSIS — I10 Essential (primary) hypertension: Secondary | ICD-10-CM | POA: Diagnosis not present

## 2021-06-14 DIAGNOSIS — T8781 Dehiscence of amputation stump: Secondary | ICD-10-CM | POA: Diagnosis not present

## 2021-06-14 DIAGNOSIS — E1151 Type 2 diabetes mellitus with diabetic peripheral angiopathy without gangrene: Secondary | ICD-10-CM | POA: Diagnosis present

## 2021-06-14 DIAGNOSIS — Z79899 Other long term (current) drug therapy: Secondary | ICD-10-CM | POA: Diagnosis not present

## 2021-06-14 DIAGNOSIS — Z885 Allergy status to narcotic agent status: Secondary | ICD-10-CM | POA: Diagnosis not present

## 2021-06-14 DIAGNOSIS — Z89512 Acquired absence of left leg below knee: Secondary | ICD-10-CM | POA: Diagnosis not present

## 2021-06-14 DIAGNOSIS — D7589 Other specified diseases of blood and blood-forming organs: Secondary | ICD-10-CM | POA: Diagnosis present

## 2021-06-14 DIAGNOSIS — I739 Peripheral vascular disease, unspecified: Secondary | ICD-10-CM

## 2021-06-14 DIAGNOSIS — R0789 Other chest pain: Secondary | ICD-10-CM | POA: Diagnosis present

## 2021-06-14 DIAGNOSIS — Z720 Tobacco use: Secondary | ICD-10-CM

## 2021-06-14 DIAGNOSIS — D631 Anemia in chronic kidney disease: Secondary | ICD-10-CM | POA: Diagnosis present

## 2021-06-14 DIAGNOSIS — E1122 Type 2 diabetes mellitus with diabetic chronic kidney disease: Secondary | ICD-10-CM | POA: Diagnosis present

## 2021-06-14 DIAGNOSIS — I5032 Chronic diastolic (congestive) heart failure: Secondary | ICD-10-CM | POA: Diagnosis present

## 2021-06-14 DIAGNOSIS — N185 Chronic kidney disease, stage 5: Secondary | ICD-10-CM | POA: Diagnosis not present

## 2021-06-14 DIAGNOSIS — D509 Iron deficiency anemia, unspecified: Secondary | ICD-10-CM | POA: Diagnosis present

## 2021-06-14 DIAGNOSIS — I120 Hypertensive chronic kidney disease with stage 5 chronic kidney disease or end stage renal disease: Secondary | ICD-10-CM | POA: Diagnosis not present

## 2021-06-14 DIAGNOSIS — E1169 Type 2 diabetes mellitus with other specified complication: Secondary | ICD-10-CM | POA: Diagnosis not present

## 2021-06-14 DIAGNOSIS — R0781 Pleurodynia: Secondary | ICD-10-CM

## 2021-06-14 DIAGNOSIS — T8789 Other complications of amputation stump: Secondary | ICD-10-CM | POA: Diagnosis not present

## 2021-06-14 DIAGNOSIS — Z20822 Contact with and (suspected) exposure to covid-19: Secondary | ICD-10-CM | POA: Diagnosis present

## 2021-06-14 DIAGNOSIS — L309 Dermatitis, unspecified: Secondary | ICD-10-CM | POA: Diagnosis present

## 2021-06-14 DIAGNOSIS — F17210 Nicotine dependence, cigarettes, uncomplicated: Secondary | ICD-10-CM | POA: Diagnosis present

## 2021-06-14 DIAGNOSIS — E669 Obesity, unspecified: Secondary | ICD-10-CM | POA: Diagnosis present

## 2021-06-14 DIAGNOSIS — Z888 Allergy status to other drugs, medicaments and biological substances status: Secondary | ICD-10-CM | POA: Diagnosis not present

## 2021-06-14 LAB — HEPATITIS B SURFACE ANTIBODY,QUALITATIVE: Hep B S Ab: REACTIVE — AB

## 2021-06-14 LAB — TROPONIN I (HIGH SENSITIVITY)
Troponin I (High Sensitivity): 16 ng/L (ref <18)
Troponin I (High Sensitivity): 14 ng/L (ref <18)
Troponin I (High Sensitivity): 14 ng/L (ref <18)

## 2021-06-14 LAB — BASIC METABOLIC PANEL
Anion gap: 14 (ref 5–15)
BUN: 23 mg/dL (ref 8–23)
CO2: 27 mmol/L (ref 22–32)
Calcium: 9.6 mg/dL (ref 8.9–10.3)
Chloride: 97 mmol/L — ABNORMAL LOW (ref 98–111)
Creatinine, Ser: 8.86 mg/dL — ABNORMAL HIGH (ref 0.61–1.24)
GFR, Estimated: 6 mL/min — ABNORMAL LOW (ref >60)
Glucose, Bld: 126 mg/dL — ABNORMAL HIGH (ref 70–99)
Potassium: 3.4 mmol/L — ABNORMAL LOW (ref 3.5–5.1)
Sodium: 138 mmol/L (ref 135–145)

## 2021-06-14 LAB — BRAIN NATRIURETIC PEPTIDE: B Natriuretic Peptide: 521.8 pg/mL — ABNORMAL HIGH (ref 0.0–100.0)

## 2021-06-14 LAB — ECHOCARDIOGRAM COMPLETE
AR max vel: 2.87 cm2
AV Peak grad: 13.2 mmHg
Ao pk vel: 1.82 m/s
Area-P 1/2: 4.21 cm2
Height: 71 in
S' Lateral: 3.8 cm
Weight: 4317.49 oz

## 2021-06-14 LAB — IRON AND TIBC
Iron: 259 ug/dL — ABNORMAL HIGH (ref 45–182)
Saturation Ratios: 73 % — ABNORMAL HIGH (ref 17.9–39.5)
TIBC: 357 ug/dL (ref 250–450)
UIBC: 98 ug/dL

## 2021-06-14 LAB — CBC
HCT: 22.7 % — ABNORMAL LOW (ref 39.0–52.0)
Hemoglobin: 7.1 g/dL — ABNORMAL LOW (ref 13.0–17.0)
MCH: 34 pg (ref 26.0–34.0)
MCHC: 31.3 g/dL (ref 30.0–36.0)
MCV: 108.6 fL — ABNORMAL HIGH (ref 80.0–100.0)
Platelets: 172 10*3/uL (ref 150–400)
RBC: 2.09 MIL/uL — ABNORMAL LOW (ref 4.22–5.81)
RDW: 16.4 % — ABNORMAL HIGH (ref 11.5–15.5)
WBC: 4.4 10*3/uL (ref 4.0–10.5)
nRBC: 0 % (ref 0.0–0.2)

## 2021-06-14 LAB — FERRITIN: Ferritin: 125 ng/mL (ref 24–336)

## 2021-06-14 LAB — MRSA NEXT GEN BY PCR, NASAL: MRSA by PCR Next Gen: NOT DETECTED

## 2021-06-14 LAB — HEPATITIS B SURFACE ANTIGEN: Hepatitis B Surface Ag: NONREACTIVE

## 2021-06-14 MED ORDER — MORPHINE SULFATE (PF) 2 MG/ML IV SOLN
2.0000 mg | Freq: Once | INTRAVENOUS | Status: AC
  Administered 2021-06-14: 2 mg via INTRAVENOUS
  Filled 2021-06-14: qty 1

## 2021-06-14 MED ORDER — SODIUM CHLORIDE 0.9 % IV SOLN: 100.0000 mL | INTRAVENOUS | Status: DC | PRN

## 2021-06-14 MED ORDER — LIDOCAINE HCL (PF) 1 % IJ SOLN: 5.0000 mL | INTRAMUSCULAR | Status: DC | PRN

## 2021-06-14 MED ORDER — IOHEXOL 350 MG/ML SOLN
100.0000 mL | Freq: Once | INTRAVENOUS | Status: AC | PRN
  Administered 2021-06-14: 100 mL via INTRAVENOUS

## 2021-06-14 MED ORDER — METHOCARBAMOL 1000 MG/10ML IJ SOLN
500.0000 mg | Freq: Once | INTRAVENOUS | Status: AC
  Administered 2021-06-14: 500 mg via INTRAVENOUS
  Filled 2021-06-14 (×3): qty 5

## 2021-06-14 MED ORDER — HEPARIN SODIUM (PORCINE) 1000 UNIT/ML DIALYSIS
1000.0000 [IU] | INTRAMUSCULAR | Status: DC | PRN
  Administered 2021-06-14: 1000 [IU] via INTRAVENOUS_CENTRAL
  Filled 2021-06-14 (×2): qty 1

## 2021-06-14 MED ORDER — METHOCARBAMOL 500 MG PO TABS
500.0000 mg | ORAL_TABLET | Freq: Four times a day (QID) | ORAL | Status: DC | PRN
  Administered 2021-06-15 – 2021-06-19 (×8): 500 mg via ORAL
  Filled 2021-06-14 (×9): qty 1

## 2021-06-14 MED ORDER — NITROGLYCERIN 0.4 MG SL SUBL
SUBLINGUAL_TABLET | SUBLINGUAL | Status: AC
  Administered 2021-06-14: 0.4 mg
  Filled 2021-06-14: qty 1

## 2021-06-14 MED ORDER — PENTAFLUOROPROP-TETRAFLUOROETH EX AERO: 1.0000 "application " | INHALATION_SPRAY | CUTANEOUS | Status: DC | PRN

## 2021-06-14 MED ORDER — ALTEPLASE 2 MG IJ SOLR: 2.0000 mg | Freq: Once | INTRAMUSCULAR | Status: DC | PRN

## 2021-06-14 MED ORDER — NITROGLYCERIN 0.4 MG SL SUBL
SUBLINGUAL_TABLET | SUBLINGUAL | Status: AC
  Administered 2021-06-14: 0.4 mg
  Filled 2021-06-14: qty 2

## 2021-06-14 MED ORDER — OXYCODONE-ACETAMINOPHEN 5-325 MG PO TABS: 1.0000 | ORAL_TABLET | Freq: Four times a day (QID) | ORAL | Status: DC | PRN

## 2021-06-14 MED ORDER — FERROUS SULFATE 325 (65 FE) MG PO TABS
325.0000 mg | ORAL_TABLET | Freq: Every morning | ORAL | Status: DC
  Administered 2021-06-14 – 2021-06-20 (×7): 325 mg via ORAL
  Filled 2021-06-14 (×5): qty 1

## 2021-06-14 MED ORDER — CALCIUM ACETATE (PHOS BINDER) 667 MG PO CAPS
667.0000 mg | ORAL_CAPSULE | Freq: Three times a day (TID) | ORAL | Status: DC
  Administered 2021-06-14 – 2021-06-21 (×16): 667 mg via ORAL
  Filled 2021-06-14 (×19): qty 1

## 2021-06-14 MED ORDER — LIDOCAINE-PRILOCAINE 2.5-2.5 % EX CREA: 1.0000 "application " | TOPICAL_CREAM | CUTANEOUS | Status: DC | PRN

## 2021-06-14 NOTE — Progress Notes (Deleted)
NOTE  VASCULAR STAFF ADDENDUM: I have independently interviewed and examined the patient. I agree with the below Patient was initially seen in our office for poorly healing BKA, noted to have chest pain. On exam today, undergoing echo.  No current chest pain.   Pending work-up we will plan for BKA revision Monday He would benefit from CT with contrast of the left stump to assess for air/abscess near the bone as this will decide level of revision.   Cassandria Santee, MD [facility] Phone Number: [phone] 06/14/2021 6:43 PM    CC:  F/u for surgery  HPI:  This is a 61 y.o. male with ESRD and PAD.  He presented with bilateral foot pain in early December.  He underwent aortogram on 04/08/2021 and he did not have any revascularization options.  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 Scot Dock on 04/19/2021.    Pt was last seen on 05/30/2021 and at that time, he was having continued pain in his left BKA stump.  He was not having any drainage or fever.  He was at high risk for not healing.   Pt returns today for follow up.  Pt states he is having increasing pain in the stump above the eschar and below it.  There is minimal drainage on the bandage.  He has not had fevers.  After discussion, pt wants to proceed with revision of stump.    He then states he has had chest and back pain the past couple of days that comes and goes.  He states he was getting out of bed this morning when it happened.  He states "it didn't last long, maybe a couple of hours".  He is not currently having chest pain.    While waiting to set up cardiology clearance for amputation revision, pt began to have chest pain again but resolved.    Allergies  Allergen Reactions    Seroquel [Quetiapine] Other (See Comments)    Tardive kinesia/dystonia   Dilaudid [Hydromorphone Hcl] Itching and Other (See Comments)    Pt reports itchiness after IM injection     Current Facility-Administered Medications  Medication Dose Route Frequency Provider Last Rate Last Admin   0.9 %  sodium chloride infusion  100 mL Intravenous PRN Elmarie Shiley, MD       0.9 %  sodium chloride infusion  100 mL Intravenous PRN Elmarie Shiley, MD       acetaminophen (TYLENOL) tablet 650 mg  650 mg Oral Q6H PRN Lenore Cordia, MD       Or   acetaminophen (TYLENOL) suppository 650 mg  650 mg Rectal Q6H PRN Zada Finders R, MD       alteplase (CATHFLO ACTIVASE) injection 2 mg  2 mg Intracatheter Once PRN Elmarie Shiley, MD       amLODipine (NORVASC) tablet 10 mg  10 mg Oral QHS Zada Finders R, MD   10 mg at 06/13/21 2211   calcium acetate (PHOSLO) capsule 667 mg  667 mg Oral TID WC Gean Quint, MD   667 mg at 06/14/21 1631   Chlorhexidine Gluconate Cloth 2 % PADS 6 each  6 each Topical Q0600 Elmarie Shiley, MD   6 each  at 06/14/21 9373   cloNIDine (CATAPRES) tablet 0.1 mg  0.1 mg Oral Once per day on Mon Wed Fri Patel, Vishal R, MD       cloNIDine (CATAPRES) tablet 0.1 mg  0.1 mg Oral 2 times per day on Sun Tue Thu Sat Zada Finders R, MD   0.1 mg at 06/13/21 2211   doxercalciferol (HECTOROL) injection 3 mcg  3 mcg Intravenous Q M,W,F-HD Elmarie Shiley, MD   3 mcg at 06/14/21 4287   ferrous sulfate tablet 325 mg  325 mg Oral q morning [place] Eric J, DO   325 mg at 06/14/21 1207   gabapentin (NEURONTIN) capsule 100 mg  100 mg Oral TID Lenore Cordia, MD   100 mg at 06/14/21 1631   heparin injection 1,000 Units  1,000 Units Dialysis PRN Elmarie Shiley, MD   1,000 Units at 06/14/21 1108   heparin injection 5,000 Units  5,000 Units Subcutaneous Q8H Zada Finders R, MD   5,000 Units at 06/14/21 1635   hydrALAZINE (APRESOLINE) injection 10 mg  10 mg Intravenous Q6H PRN Lenore Cordia, MD       hydrOXYzine (ATARAX) tablet  25 mg  25 mg Oral TID PRN Lenore Cordia, MD   25 mg at 06/13/21 2223   lidocaine (PF) (XYLOCAINE) 1 % injection 5 mL  5 mL Intradermal PRN Elmarie Shiley, MD       lidocaine-prilocaine (EMLA) cream 1 application  1 application Topical PRN Elmarie Shiley, MD       methocarbamol (ROBAXIN) tablet 500 mg  500 mg Oral Q6H PRN [place] Eric J, DO       multivitamin (RENA-VIT) tablet 1 tablet  1 tablet Oral QHS Lenore Cordia, MD   1 tablet at 06/13/21 2211   nicotine (NICODERM CQ - dosed in mg/24 hours) patch 21 mg  21 mg Transdermal Daily Zada Finders R, MD   21 mg at 06/14/21 1203   oxyCODONE-acetaminophen (PERCOCET/ROXICET) 5-325 MG per tablet 1 tablet  1 tablet Oral Q6H PRN Lenore Cordia, MD   1 tablet at 06/14/21 1807   pantoprazole (PROTONIX) EC tablet 40 mg  40 mg Oral BID Lenore Cordia, MD   40 mg at 06/14/21 1202   pentafluoroprop-tetrafluoroeth (GEBAUERS) aerosol 1 application  1 application Topical PRN Elmarie Shiley, MD       senna-docusate (Senokot-S) tablet 1 tablet  1 tablet Oral QHS PRN Zada Finders R, MD       sodium chloride flush (NS) 0.9 % injection 3 mL  3 mL Intravenous Q12H Zada Finders R, MD   3 mL at 06/14/21 1203   sodium thiosulfate 25 g in sodium chloride 0.9 % 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    Assessment/Plan:  This is a 61 y.o. male who is s/p: left BKA on 04/16/2021 by Dr. Scot Dock and plication of the fistula and [facility] placement also by Dr. Scot Dock on 04/19/2021.    Left below knee Amputation -pt continues to have pain in stump and wants to proceed with revision.  He does not want to continue to monitor for healing.   Pt was given paper Rx for Norco 5/325 one po q6h prn pain #10 no refill.  PDMP  reviewed.  Chest pain -discussed with pt that given he has had chest  pain, we were going to send him for cardiology evaluation prior to proceeding, however, he developed chest pain again today while in our office.  Plan for EMS to take pt to hospital for evaluation.   If pt gets admitted, we can consult on pt and plan for revision while hospitalized pending cardiology workup.   ESRD -pt fistula can be accessed on 06/14/2021.  Once the fistula is being used without difficulty, the catheter can be scheduled to be removed.  Okay for benadryl for itching.   Leontine Locket, [facility] [phone]   Clinic MD:  Trula Slade

## 2021-06-14 NOTE — Consult Note (Signed)
[facility] Nurse wound consult note Consultation was completed by review of records, images and assistance from the bedside nurse/clinical staff.  Reason for Consult: L BKA S/P BKA 12/22; followed by vascular; seen in their office 06/13/20. Plans for admission for revision/debridement however patient has been having chest pain. Admitted 06/13/21 for CP Wound type:surgical  Pressure Injury POA: NA Wound bed:100% eschar over most of the incision line; one small area on the medial aspect of the incision open and 100%  pink Drainage (amount, consistency, odor) none Periwound:intact, dry skin  Dressing procedure/placement/frequency:  Paint eschar with betadine daily, allow to air dry.  Notified VVS; agree with plan  They will follow for wound care  Discussed POC with patient and bedside nurse.  Re consult if needed, will not follow at this time. Thanks  Matteus Mcnelly [employer], RN,CWOCN, CNS, [place] [phone])

## 2021-06-14 NOTE — Progress Notes (Signed)
Patient arrived to unit, VSS, cardiac monitoring initiated and verified. Patient states his chest strated hurting when he moved from the bed to the stretcher, but soon subsided. Will continue to monitor.

## 2021-06-14 NOTE — Progress Notes (Signed)
Echocardiogram 2D Echocardiogram has been performed.  Daniel [name] 06/14/2021, 2:52 PM

## 2021-06-14 NOTE — Progress Notes (Signed)
Hemodialysis nurse called, report given

## 2021-06-14 NOTE — Progress Notes (Signed)
Chest pain 10/10 to 8/10. BP- 118/70 & HR-85, Nitro x3 given.

## 2021-06-14 NOTE — Progress Notes (Addendum)
°  Progress Note  VASCULAR STAFF ADDENDUM: I have independently interviewed and examined the patient. I agree with the below Patient was initially seen in our office for poorly healing BKA, noted to have chest pain. On exam today, undergoing echo.  No current chest pain.   Pending work-up we will plan for BKA revision Monday He would benefit from CT with contrast of the left stump to assess for air/abscess near the bone as this will decide level of revision.  06/14/2021 2:43 PM   Subjective:  says he feels a little better than yesterday but still has pain in his stump   Vitals:   06/14/21 1337 06/14/21 1342  BP: (!) 175/86 (!) 171/75  Pulse:    Resp:    Temp:    SpO2:      Physical Exam: Incisions:  about the same as yesterday - still tender to palpation on the lateral aspect.    CBC    Component Value Date/Time   WBC 4.4 06/14/2021 0356   RBC 2.09 (L) 06/14/2021 0356   HGB 7.1 (L) 06/14/2021 0356   HGB 8.4 (L) 08/22/2020 1141   HCT 22.7 (L) 06/14/2021 0356   HCT 25.0 (L) 08/22/2020 1141   PLT 172 06/14/2021 0356   PLT 184 08/22/2020 1141   MCV 108.6 (H) 06/14/2021 0356   MCV 94 08/22/2020 1141   MCH 34.0 06/14/2021 0356   MCHC 31.3 06/14/2021 0356   RDW 16.4 (H) 06/14/2021 0356   RDW 16.7 (H) 08/22/2020 1141   LYMPHSABS 2.0 06/13/2021 1105   LYMPHSABS 1.8 08/22/2020 1141   MONOABS 0.8 06/13/2021 1105   EOSABS 0.1 06/13/2021 1105   EOSABS 0.3 08/22/2020 1141   BASOSABS 0.0 06/13/2021 1105   BASOSABS 0.0 08/22/2020 1141    BMET    Component Value Date/Time   NA 138 06/14/2021 0356   NA 138 08/22/2020 1141   K 3.4 (L) 06/14/2021 0356   CL 97 (L) 06/14/2021 0356   CO2 27 06/14/2021 0356   GLUCOSE 126 (H) 06/14/2021 0356   BUN 23 06/14/2021 0356   BUN 20 08/22/2020 1141   CREATININE 8.86 (H) 06/14/2021 0356   CALCIUM 9.6 06/14/2021 0356   CALCIUM 7.9 (L) 08/01/2019 0702   GFRNONAA 6 (L) 06/14/2021 0356   GFRAA 7 (L) 12/28/2019 1640    INR     Component Value Date/Time   INR 1.0 03/24/2021 1831     Intake/Output Summary (Last 24 hours) at 06/14/2021 1443 Last data filed at 06/14/2021 1401 Gross per 24 hour  Intake 1862.67 ml  Output 2450 ml  Net -587.33 ml     Assessment/Plan:  61 y.o. male is s/p left below knee amputation  04/16/2021 by Dr. Scot Dock  -will plan to take pt to OR on Monday.  Will get a CT scan with contrast to evaluate left BKA stump to determine plan.  Pt in agreement.     Leontine Locket, PA-C [facility] [phone] 06/14/2021 2:43 PM

## 2021-06-14 NOTE — Progress Notes (Signed)
Spoke with nurse from [facility] for an update on patients condition.

## 2021-06-14 NOTE — Progress Notes (Signed)
Patient states his chest pain is now on his left side/lower ribcage, and it happens when he inhales. EKG done earlier and pain medication given.

## 2021-06-14 NOTE — Consult Note (Addendum)
Cardiology Consultation:   Patient ID: LELYND POER MRN: [id number]; DOB: [birth year]  Admit date: 06/13/2021 Date of Consult: 06/14/2021  PCP:  [name], NP   [facility] Providers Cardiologist:  None   {    Patient Profile:   Daniel [name] is a 61 y.o. 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 [place]  History of Present Illness:   Mr. Aldrete is a 61 year old male with above medical history.  Patient was admitted to the hospital in January 2019 for treatment of right thalamic ICH with intravascular extension that was thought to be related to hypertension, cocaine use.  Echo during that hospitalization showed EF 55 to 60%, moderate LVH, grade 2 diastolic dysfunction.  Patient was hospitalized multiple times between 2019 and 2021 for treatment of anemia secondary to chronic renal disease. Patient was hospitalized in April 2021 and it was noted that the patient had progressed to end-stage renal disease and started dialysis. Was also noted to have GI bleed at that time and small bowel endoscopy on 08/01/2020 revealed APCs in the duodenum and jejunum. Underwent EGD and enteroscopy on 08/10/20 that snowed duodenal ulcer that was treated with 3 hemostatic clips. In September 2022, patient was admitted for treatment of GI bleed again and underwent EKG on 9/17 that showed oozing gastic ulcer treated with hemostatic clips. In December 2022, patient developed a diabetic food infection of his left foot. That in combination with significant PAD lead to a left BKA.   Most recent Echocardiogram was completed on 10/28/2020 and showed LVEF 60-65%, moderate LVH.   Patient presented to the ED on 2/16 complaining of 3 days of intermittent, left-sided chest pain. Labs in  the ED showed Na 139, K 3.4, creatinine 7.86, albumin 2.5, hemoglobin 7.3, WBC 5.3, MCV 107.8. HSTN 17>>18>>16>>14. BNP elevated to 521.8. EKG showed normal sinus rhythm, inferior and anterolateral infarcts (age undetermined). CXR showed mild pulmonary edema, trace bilateral pleural effusions. COVID/Flu negative. Fecal occult negative. Received 1 unit PRBC with HD.    On interview, patient reports that he has been having left-sided chest pain for the past 4 to 5 days.  Pain is located on the left side of chest, is sharp, constant.  Pain is worse with deep breaths, cough, laying on back.  Patient is not very active so unsure if pain is worse with exertion.Patient admits to missing dialysis last week.  Patient reported some shortness of breath, denies worsening shortness of breath when laying on back.  Denies any palpitations.  Has a dry cough.  Denies any recent fever, body aches, chills.  Past Medical History:  Diagnosis Date   Anemia    Diabetes mellitus without complication [facility])    patient denies   Dialysis patient [facility])    End stage chronic kidney disease ([place])    Hypertension    ICH (intracerebral hemorrhage) ([place]) 05/20/2017   PAD (peripheral artery disease) ([facility])    Shoulder pain, left 06/28/2013    Past Surgical History:  Procedure Laterality Date   A/V FISTULAGRAM N/A 08/15/2020   Procedure: A/V FISTULAGRAM - Left Upper;  Surgeon: Cherre Robins, MD;  Location: [place] [facility];  Service: Cardiovascular;  Laterality: N/A;   ABDOMINAL AORTOGRAM W/LOWER EXTREMITY N/A 04/08/2021   Procedure: ABDOMINAL  AORTOGRAM W/LOWER EXTREMITY;  Surgeon: Waynetta Sandy, MD;  Location: [place] [facility];  Service: Cardiovascular;  Laterality: N/A;   AMPUTATION Left 04/16/2021   Procedure: LEFT BELOW KNEE AMPUTATION;  Surgeon: Angelia Mould, MD;  Location: [place];  Service: Vascular;  Laterality: Left;   APPENDECTOMY     AV FISTULA PLACEMENT Left 08/03/2019   Procedure: LEFT ARM  ARTERIOVENOUS (AV) CEPHALIC  FISTULA CREATION;  Surgeon: Waynetta Sandy, MD;  Location: [place];  Service: Vascular;  Laterality: Left;   BIOPSY  06/30/2019   Procedure: BIOPSY;  Surgeon: Ronald Lobo, MD;  Location: [facility];  Service: Endoscopy;;   BIOPSY  08/02/2019   Procedure: BIOPSY;  Surgeon: Yetta Flock, MD;  Location: [facility] ENDOSCOPY;  Service: Gastroenterology;;   BIOPSY  02/08/2021   Procedure: BIOPSY;  Surgeon: Sharyn Creamer, MD;  Location: [facility] ENDOSCOPY;  Service: Gastroenterology;;   BIOPSY  02/24/2021   Procedure: BIOPSY;  Surgeon: [name], MD;  Location: [facility] ENDOSCOPY;  Service: Gastroenterology;;   BIOPSY  03/26/2021   Procedure: BIOPSY;  Surgeon: Lavena Bullion, DO;  Location: [place];  Service: Gastroenterology;;   COLONOSCOPY  01/23/2012   Procedure: COLONOSCOPY;  Surgeon: Danie Binder, MD;  [facility];  Service: Endoscopy;  Laterality: N/A;  11:10 AM   COLONOSCOPY WITH PROPOFOL N/A 06/30/2019   Procedure: COLONOSCOPY WITH PROPOFOL;  Surgeon: Ronald Lobo, MD;  Location: [place];  Service: Endoscopy;  Laterality: N/A;   ENTEROSCOPY N/A 08/02/2019   Procedure: ENTEROSCOPY;  Surgeon: Yetta Flock, MD;  Location: [facility] ENDOSCOPY;  Service: Gastroenterology;  Laterality: N/A;   ENTEROSCOPY N/A 02/24/2021   Procedure: ENTEROSCOPY;  Surgeon: [name], MD;  Location: [facility] ENDOSCOPY;  Service: Gastroenterology;  Laterality: N/A;   ENTEROSCOPY N/A 03/26/2021   Procedure: ENTEROSCOPY;  Surgeon: Lavena Bullion, DO;  Location: [place];  Service: Gastroenterology;  Laterality: N/A;   ESOPHAGOGASTRODUODENOSCOPY N/A 08/10/2020   Procedure: ESOPHAGOGASTRODUODENOSCOPY (EGD);  Surgeon: Jerene Bears, MD;  Location: [facility] ENDOSCOPY;  Service: Gastroenterology;  Laterality: N/A;   ESOPHAGOGASTRODUODENOSCOPY (EGD) WITH PROPOFOL N/A 06/30/2019   Procedure: ESOPHAGOGASTRODUODENOSCOPY (EGD) WITH PROPOFOL;  Surgeon: Ronald Lobo, MD;  Location: [place];  Service: Endoscopy;  Laterality: N/A;   ESOPHAGOGASTRODUODENOSCOPY (EGD) WITH PROPOFOL N/A 01/12/2021   Procedure: ESOPHAGOGASTRODUODENOSCOPY (EGD) WITH PROPOFOL;  Surgeon: Sharyn Creamer, MD;  Location: [place];  Service: Gastroenterology;  Laterality: N/A;   ESOPHAGOGASTRODUODENOSCOPY (EGD) WITH PROPOFOL N/A 02/08/2021   Procedure: ESOPHAGOGASTRODUODENOSCOPY (EGD) WITH PROPOFOL;  Surgeon: Sharyn Creamer, MD;  Location: [place];  Service: Gastroenterology;  Laterality: N/A;   FISTULA SUPERFICIALIZATION Left 10/17/2019   Procedure: LEFT UPPER EXTREMITY FISTULA REVISION, SIDE BRANCH LIGATION,  AND SUPERFICIALIZATION;  Surgeon: Marty Heck, MD;  Location: [place];  Service: Vascular;  Laterality: Left;   FISTULA SUPERFICIALIZATION Left 60/73/7106   Procedure: PLICATION OF ANEURYSM LEFT ARTERIOVENOUS FISTULA;  Surgeon: Angelia Mould, MD;  Location: [place];  Service: Vascular;  Laterality: Left;   GIVENS CAPSULE STUDY N/A 06/30/2019   Procedure: GIVENS CAPSULE STUDY;  Surgeon: Ronald Lobo, MD;  Location: [place];  Service: Endoscopy;  Laterality: N/A;   HEMOSTASIS CLIP PLACEMENT  08/10/2020   Procedure: HEMOSTASIS CLIP PLACEMENT;  Surgeon: Jerene Bears, MD;  Location: [place];  Service: Gastroenterology;;   HEMOSTASIS CLIP PLACEMENT  01/12/2021   Procedure: HEMOSTASIS CLIP PLACEMENT;  Surgeon: Sharyn Creamer, MD;  Location: [facility] ENDOSCOPY;  Service: Gastroenterology;;   HEMOSTASIS CONTROL  08/02/2019   Procedure: HEMOSTASIS CONTROL;  Surgeon: Yetta Flock, MD;  Location: [facility] ENDOSCOPY;  Service: Gastroenterology;;   HOT HEMOSTASIS  02/24/2021   Procedure: HOT HEMOSTASIS (ARGON PLASMA COAGULATION/BICAP);  Surgeon: [name], MD;  Location: [facility] ENDOSCOPY;  Service: Gastroenterology;;   HOT HEMOSTASIS N/A 03/26/2021   Procedure: HOT HEMOSTASIS (ARGON PLASMA COAGULATION/BICAP);  Surgeon: Lavena Bullion, DO;  Location:  [facility] ENDOSCOPY;  Service: Gastroenterology;  Laterality: N/A;   INCISION AND DRAINAGE ABSCESS N/A 06/29/2016   Procedure: INCISION AND DRAINAGE ABDOMINAL WALL ABSCESS;  Surgeon: Alphonsa Overall, MD;  Location: [facility];  Service: General;  Laterality: N/A;   INSERTION OF DIALYSIS CATHETER Right 08/03/2019   Procedure: INSERTION OF DIALYSIS CATHETER;  Surgeon: Waynetta Sandy, MD;  Location: [place];  Service: Vascular;  Laterality: Right;   INSERTION OF DIALYSIS CATHETER Right 10/22/2019   Procedure: INSERTION OF 23CM TUNNELED DIALYSIS CATHETER RIGHT INTERNAL JUGULAR;  Surgeon: Angelia Mould, MD;  Location: [place];  Service: Vascular;  Laterality: Right;   INSERTION OF DIALYSIS CATHETER Right 08/12/2020   Procedure: INSERTION OF Right internal Jugular TUNNELED  DIALYSIS CATHETER.;  Surgeon: Waynetta Sandy, MD;  Location: [place];  Service: Vascular;  Laterality: Right;   INSERTION OF DIALYSIS CATHETER N/A 04/19/2021   Procedure: INSERTION OF TUNNELED DIALYSIS CATHETER;  Surgeon: Angelia Mould, MD;  Location: [place];  Service: Vascular;  Laterality: N/A;   Left heel surgery     PENILE BIOPSY N/A 03/26/2020   Procedure: PENILE ULCER DEBRIDEMENT;  Surgeon: Remi Haggard, MD;  Location: [facility];  Service: Urology;  Laterality: N/A;  30 MINS   SCLEROTHERAPY  01/12/2021   Procedure: SCLEROTHERAPY;  Surgeon: Sharyn Creamer, MD;  Location: [facility] ENDOSCOPY;  Service: Gastroenterology;;   Clide Deutscher  02/24/2021   Procedure: Clide Deutscher;  Surgeon: [name], MD;  Location: [facility] ENDOSCOPY;  Service: Gastroenterology;;     Home Medications:  Prior to Admission medications   Medication Sig Start Date End Date Taking? Authorizing Provider  amLODipine (NORVASC) 10 MG tablet Take 1 tablet (10 mg total) by mouth at bedtime. 03/23/21  Yes [name], NP  calcium acetate (PHOSLO) 667 MG capsule Take 1,334 mg by mouth 3 (three) times daily with meals. 03/14/21  Yes  [provider]  cloNIDine (CATAPRES) 0.1 MG tablet Take 0.1 mg by mouth See admin instructions. Take one tablet (0.1 mg) twice daily at 9am and 6pm on Sunday, Tuesday, Thursday, Saturday (non-dialysis days), take one tablet (0.1 mg) at bedtime - 9pm on Monday, Wednesday, Friday (dialysis days) 03/13/21  Yes [provider]  ferrous sulfate 325 (65 FE) MG tablet Take 325 mg by mouth every morning.   Yes [provider]  gabapentin (NEURONTIN) 100 MG capsule Take 1 capsule (100 mg total) by mouth 3 (three) times daily. 04/09/21  Yes Waynetta Sandy, MD  HYDROcodone-acetaminophen [facility]) 5-325 MG tablet Take 1 tablet by mouth every 6 (six) hours as needed for moderate pain. 06/13/21  Yes Rhyne, Hulen Shouts, PA-C  Lidocaine 5 % CREA Apply 1 application topically every Monday, Wednesday, and Friday. Dialysis days   Yes [provider]  loperamide (IMODIUM) 2 MG capsule Take 2 capsules (4 mg total) by mouth 3 (three) times daily as needed for diarrhea or loose stools. Patient taking differently: Take 4 mg by mouth every 8 (eight) hours as needed for diarrhea or loose stools. 04/23/21  Yes Bonnielee Haff, MD  Multiple Vitamin (MULTIVITAMIN WITH MINERALS)  TABS tablet Take 1 tablet by mouth at bedtime.   Yes [provider]  Nutritional Supplements (,FEEDING SUPPLEMENT, PROSOURCE PLUS) liquid Take 30 mLs by mouth 3 (three) times daily after meals.   Yes [provider]  oxyCODONE-acetaminophen (PERCOCET/ROXICET) 5-325 MG tablet Take 1 tablet by mouth every 6 (six) hours as needed for severe pain. 05/30/21  Yes Baglia, Corrina, PA-C  pantoprazole (PROTONIX) 40 MG tablet Take 1 tablet (40 mg total) by mouth 2 (two) times daily. 02/28/21  Yes Charlynne Cousins, MD  saccharomyces boulardii (FLORASTOR) 250 MG capsule Take 1 capsule (250 mg total) by mouth 2 (two) times daily. 04/23/21  Yes Bonnielee Haff, MD  silver sulfADIAZINE (SILVADENE) 1 % cream  Apply 1 application topically daily. To lower back   Yes [provider]  blood glucose meter kit and supplies KIT Dispense based on patient and insurance preference. Use up to four times daily as directed. (FOR ICD-9 250.00, 250.01). 07/01/16   Nita Sells, MD  hydrOXYzine (ATARAX) 25 MG tablet TAKE 1 TABLET BY MOUTH EVERY 8 HOURS AS NEEDED FOR ANXIETY OR  ITCHING Strength: 25 mg Patient not taking: Reported on 06/14/2021 04/23/21   Bonnielee Haff, MD  multivitamin (RENA-VIT) TABS tablet Take 1 tablet by mouth at bedtime. Patient not taking: Reported on 06/14/2021 04/23/21   Bonnielee Haff, MD  colchicine 0.6 MG tablet Take 0.5 tablets (0.3 mg total) by mouth 2 (two) times daily. 07/24/20 07/29/20  Noemi Chapel, MD  furosemide (LASIX) 40 MG tablet Take 1 tablet (40 mg total) by mouth daily. 07/25/19 02/09/20  Ladona Horns, MD    Inpatient Medications: Scheduled Meds:  amLODipine  10 mg Oral QHS   calcium acetate  667 mg Oral TID WC   Chlorhexidine Gluconate Cloth  6 each Topical Q0600   cloNIDine  0.1 mg Oral Once per day on Mon Wed Fri   cloNIDine  0.1 mg Oral 2 times per day on Sun Tue Thu Sat   doxercalciferol  3 mcg Intravenous Q M,W,F-HD   ferrous sulfate  325 mg Oral q morning   gabapentin  100 mg Oral TID   heparin  5,000 Units Subcutaneous Q8H   multivitamin  1 tablet Oral QHS   nicotine  21 mg Transdermal Daily   pantoprazole  40 mg Oral BID   sodium chloride flush  3 mL Intravenous Q12H   Continuous Infusions:  sodium chloride     sodium chloride     sodium thiosulfate infusion for calciphylaxis 25 g (06/14/21 1018)   PRN Meds: sodium chloride, sodium chloride, acetaminophen **OR** acetaminophen, alteplase, heparin, hydrALAZINE, hydrOXYzine, lidocaine (PF), lidocaine-prilocaine, oxyCODONE-acetaminophen, pentafluoroprop-tetrafluoroeth, senna-docusate  Allergies:    Allergies  Allergen Reactions   Seroquel [Quetiapine] Other (See Comments)    Tardive  kinesia/dystonia   Dilaudid [Hydromorphone Hcl] Itching and Other (See Comments)    Pt reports itchiness after IM injection     Social History:   Social History   Socioeconomic History   Marital status: Single    Spouse name: Not on file   Number of children: Not on file   Years of education: Not on file   Highest education level: Not on file  Occupational History   Occupation: disabled  Tobacco Use   Smoking status: Every Day    Packs/day: 0.50    Years: 45.00    Pack years: 22.50    Types: Cigarettes   Smokeless tobacco: Never  Vaping Use   Vaping Use: Never used  Substance  and Sexual Activity   Alcohol use: Not Currently    Alcohol/week: 12.0 standard drinks    Types: 12 Cans of beer per week   Drug use: Not Currently    Types: "Crack" cocaine    Comment: last in 2020   Sexual activity: Yes    Birth control/protection: None  Other Topics Concern   Not on file  Social History Narrative   Not on file   Social Determinants of Health   Financial Resource Strain: Not on file  Food Insecurity: Not on file  Transportation Needs: Not on file  Physical Activity: Not on file  Stress: Not on file  Social Connections: Not on file  Intimate Partner Violence: Not on file    Family History:    Family History  Problem Relation Age of Onset   Colon cancer Neg Hx      ROS:  Please see the history of present illness.   All other ROS reviewed and negative.     Physical Exam/Data:   Vitals:   06/14/21 1149 06/14/21 1200 06/14/21 1337 06/14/21 1342  BP: (!) 150/88 (!) 151/83 (!) 175/86 (!) 171/75  Pulse: 84 85    Resp: 16 16    Temp: 98 F (36.7 C)     TempSrc: Oral     SpO2: 99%     Weight:      Height:        Intake/Output Summary (Last 24 hours) at 06/14/2021 1603 Last data filed at 06/14/2021 1401 Gross per 24 hour  Intake 1912.67 ml  Output 2450 ml  Net -537.33 ml   Last 3 Weights 06/14/2021 06/14/2021 06/14/2021  Weight (lbs) 269 lb 13.5 oz 273 lb 9.5  oz 271 lb 13.2 oz  Weight (kg) 122.4 kg 124.1 kg 123.3 kg     Body mass index is 37.64 kg/m.  General:  Well nourished, well developed, in no acute distress, laying in the bed.  HEENT: normal Neck: JVD noted Vascular: Radial pulses 2+ bilaterally Cardiac:  normal S1, S2; RRR, grade 2/6 systolic murmur  Lungs:  inspiratory rales in bilateral lung bases, occasional expiratory wheezes  Abd: soft, nontender, no hepatomegaly  Ext: S/p left BKA. Trace edema in RLE  Musculoskeletal:  S/p left BKA  Skin: warm and dry  Neuro:  CNs 2-12 intact, no focal abnormalities noted Psych:  Normal affect   EKG:  The EKG was personally reviewed and demonstrates:  sinus rhythm, inferior infarct (age undetermined)  Telemetry:  Telemetry was personally reviewed and demonstrates:  Sinus rhythm, HR in 90s-100s  Relevant CV Studies: Echo pending   Laboratory Data:  High Sensitivity Troponin:   Recent Labs  Lab 06/13/21 1105 06/13/21 1310 06/14/21 0641 06/14/21 1139  TROPONINIHS 17 18* 16 14     Chemistry Recent Labs  Lab 06/13/21 1105 06/14/21 0356  NA 139 138  K 3.4* 3.4*  CL 97* 97*  CO2 28 27  GLUCOSE 96 126*  BUN 18 23  CREATININE 7.86* 8.86*  CALCIUM 9.4 9.6  GFRNONAA 7* 6*  ANIONGAP 14 14    Recent Labs  Lab 06/13/21 1105  PROT 8.4*  ALBUMIN 2.5*  AST 39  ALT 12  ALKPHOS 67  BILITOT 0.5   Lipids No results for input(s): CHOL, TRIG, HDL, LABVLDL, LDLCALC, CHOLHDL in the last 168 hours.  Hematology Recent Labs  Lab 06/13/21 1105 06/14/21 0356  WBC 5.3 4.4  RBC 2.17* 2.09*  HGB 7.3* 7.1*  HCT 23.4* 22.7*  MCV 107.8*  108.6*  MCH 33.6 34.0  MCHC 31.2 31.3  RDW 16.6* 16.4*  PLT 179 172   Thyroid No results for input(s): TSH, FREET4 in the last 168 hours.  BNP Recent Labs  Lab 06/14/21 0356  BNP 521.8*    DDimer No results for input(s): DDIMER in the last 168 hours.   Radiology/Studies:  DG Chest 2 View  Result Date: 06/13/2021 CLINICAL DATA:  Chest pain  EXAM: CHEST - 2 VIEW COMPARISON:  None. FINDINGS: Right neck catheter tip overlies the mid superior vena cava. Unchanged, enlarged cardiac silhouette. There are diffuse interstitial opacities. There are lower lung alveolar opacities. Trace effusions. No visible pneumothorax. No acute osseous abnormality. Left axillary stent. IMPRESSION: Mild pulmonary edema and trace bilateral pleural effusions. Cardiomegaly. Electronically Signed   By: Maurine Simmering M.D.   On: 06/13/2021 11:31     Assessment and Plan:   Chest Pain  -Patient presents complaining of 4 days of chest pain.  Pain is sharp, left-sided, constant.  Worse with coughing, deep breaths, laying on back.  Associated with occasional shortness of breath. Given SL nitro in ED, did not relieve pain.  - HSTN 17>>18>>16>>14 - EKG without signs of ischemia  - With low and flat troponins, atypical description of chest pain, this is not ACS. Likely that patient has some underlying CAD, but patient is not a candidate for cath due to ESRD.  - Can do outpatient CT coronary to further assess CAD  - Pain possibly related to acute on chronic CHF, possibly related to hemoglobin of 7.3.   Acute on chronic diastolic heart failure -Echo in 2019 showed EF 55 to 60%, moderate LVH, grade 2 diastolic dysfunction -Most recent echo in 10/2020 showed EF 60 to 65%, Moderate LVH, indeterminate diastolic parameters -BNP elevated to 521.8 this admission  - Per chart review, patient appeared fluid overloaded on initial exam.  Chest x-ray showed mild pulmonary edema, trace bilateral pleural effusions -Dialysis will help to improve volume status. Received dialysis today. - Echocardiogram after dialysis continued to show signs of fluid overload. Continued to have RLE edema.   Anemia secondary to chronic kidney disease -Hemoglobin 7.3 on admission, baseline usually 8-9 -Transfused 1 unit packed red blood cells during dialysis -Has history of GI bleed, fecal occult negative  this admission  Otherwise managed per primary  - ESRD - HTN  -Secondary Hyperparathyroidism/Hyperphosphatemia:  Risk Assessment/Risk Scores:   HEAR Score (for undifferentiated chest pain):  HEAR Score: 3{   New York Heart Association (NYHA) Functional Class NYHA Class III        For questions or updates, please contact [facility] Please consult [email or web address] for contact info under    Signed, Margie Billet, PA-C  06/14/2021 4:03 PM  I have seen and examined the patient along with Margie Billet, PA-C .  I have reviewed the chart, notes and new data.  I agree with PA/NP's note.  Key new complaints: Chest pain sounds decidedly pleuritic in nature, not consistent with angina.  He actually winces when he tries to take a deep breath.  Hurts worse if he coughs.  It occurs at rest, without exertion.  Has not had hemoptysis. Key examination changes: He appears grossly volume overloaded, even though he has just had hemodialysis.  His neck veins are distended almost to the angle of the jaw, he has edema in his right lower extremity. Key new findings / data: In the echocardiogram that was just performed at 1430 after hemodialysis also shows clear  signs of elevated mean left atrial pressure with pseudo normal mitral inflow pattern.  His left ventricle is dilated, but he has normal left ventricular systolic function.  There are no distinct regional wall motion abnormalities and overall LV function appears very similar to the study performed in July 2022.  There is no evidence of a pericardial effusion.  PLAN: 1.  Chest pain: This patient has extensive risk factors for coronary artery disease and has a history of PAD.  It is highly likely that he has underlying coronary disease.  Having said that, his current symptoms do not appear compatible with angina pectoris, but rather pleuritis or pericarditis.  His ECG does not show changes consistent with ischemia and his cardiac enzymes are  normal.  His echocardiogram does not show regional wall motion abnormalities. It is probably wise to do some type of coronary evaluation as an outpatient, for example a coronary CT angiogram.  However I do not think that would be useful with his current acute illness. Since there is no EKG or echo evidence of pericarditis, would look for evidence of pneumonia or pulmonary embolism.  Lower extremity DVT studies have been ordered. 2. CHF: Despite having just completed hemodialysis, he appears hypervolemic with signs of both right and left heart volume overload by both clinical findings and by echo.  He needs more volume removal, recommend challenging his dry weight.  BNP is also markedly higher than in the past (it was 521.8 before hemodialysis, but his baseline appears to be less than 90).  Sanda Klein, MD, [place] [phone] 06/14/2021, 4:05 PM

## 2021-06-14 NOTE — TOC Progression Note (Addendum)
Transition of Care [facility]) - Progression Note    Patient Details  Name: Daniel [name] MRN: [id number] Date of Birth: [birth year]  Transition of [facility]) CM/SW Contact  Zenon Mayo, RN Phone Number: 06/14/2021, 1:16 PM  Clinical Narrative:    From [facility] , CSW aware, chest pain, left fistula, R AKA, eschar at AKA site, left leg pain, also need vascular u/s to r/o dvt. Also has wound vac.         Expected Discharge Plan and Services                                                 Social Determinants of Health (SDOH) Interventions    Readmission Risk Interventions Readmission Risk Prevention Plan 02/27/2021 07/07/2019  Transportation Screening Complete Complete  PCP or Specialist Appt within 3-5 Days - Complete  HRI or [place] - Complete  Social Work Consult for [place] Planning/Counseling - Complete  Palliative Care Screening - Not Applicable  Medication Review Press photographer) Complete Complete  PCP or Specialist appointment within 3-5 days of discharge Complete -  [place] or Home Care Consult Complete -  SW Recovery Care/Counseling Consult Complete -  Palliative Care Screening Not Applicable -  [place] Not Applicable -  Some recent data might be hidden

## 2021-06-14 NOTE — Plan of Care (Signed)
  Problem: Education: Goal: Knowledge of General Education information will improve Description: Including pain rating scale, medication(s)/side effects and non-pharmacologic comfort measures Outcome: Progressing   Problem: Health Behavior/Discharge Planning: Goal: Ability to manage health-related needs will improve Outcome: Progressing   Problem: Clinical Measurements: Goal: Respiratory complications will improve Outcome: Progressing   

## 2021-06-14 NOTE — Assessment & Plan Note (Signed)
--  Protonix 40 mg p.o. twice daily

## 2021-06-14 NOTE — Progress Notes (Signed)
Patient having chest pain again, provider notified.

## 2021-06-14 NOTE — Progress Notes (Signed)
RLE venous duplex has been completed.   Results can be found under chart review under CV PROC. 06/14/2021 4:15 PM Khrystyna Schwalm RVT, RDMS

## 2021-06-14 NOTE — Consult Note (Signed)
ESRD Consult Note   Assessment/Recommendations:  # ESRD:  -outpatient HD orders: [facility]. MWF. 4hrs, Nipro, 450/500, EDW 125 kg, 2K, 2 Cal.  No heparin.  Mircera 225 mcg every 2 weeks (last dose: 2/10), Hectorol 37mcg qtx -HD today on schedule, next HD planned for Mon  # Chest pain -troponins not significantly elevated -possible related to underlying CHF and/or anemia -per pmd  # Volume/ hypertension: EDW 125kg.  UF as tolerated. -Resume home antihypertensives  # Symptomatic anemia, anemia of Chronic Kidney Disease, macrocytosis: Hemoglobin 7.3 on admission . Anemia ahs been an ongoing issue. Currently receiving max dose esa as an outpatient. FOBT neg. R/O bleed per pmd. Will check iron panel -prbc w/ HD today, transfuse PRN  # Secondary Hyperparathyroidism/Hyperphosphatemia: check phos, on nathiosulfate  # Vascular access: [facility]- [facility]  # Additional recommendations: - Dose all meds for creatinine clearance < 10 ml/min  - Unless absolutely necessary, no MRIs with gadolinium.  - Implement save arm precautions.  Prefer needle sticks in the dorsum of the hands or wrists.  No blood pressure measurements in arm. - If blood transfusion is requested during hemodialysis sessions, please alert [place] prior to the session.  - If a hemodialysis catheter line culture is requested, please alert [place] as only hemodialysis nurses are able to collect those specimens.   Recommendations were discussed with the primary team.  Gean Quint, MD [facility]  History of Present Illness: Daniel [name] is a/an 61 y.o. 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  Hemoglobin 7.3 on admission.  Has been an  ongoing issue as outpatient HD unit despite being on max ESA and status post Fe load.  Has mild pulmonary edema and trace bilateral pleural effusions on CXR.  FOBT checked and is reported as negative. To receive HD w/ PRBC today. Admitted as obs. Patient seen and examined on HD. Still having left sided chest pain, currently receiving PRBC.    Medications:  Current Facility-Administered Medications  Medication Dose Route Frequency Provider Last Rate Last Admin   0.9 %  sodium chloride infusion (Manually program via Guardrails IV Fluids)   Intravenous Once Elmarie Shiley, MD       0.9 %  sodium chloride infusion  100 mL Intravenous PRN Elmarie Shiley, MD       0.9 %  sodium chloride infusion  100 mL Intravenous PRN Elmarie Shiley, MD       acetaminophen (TYLENOL) tablet 650 mg  650 mg Oral Q6H PRN Lenore Cordia, MD       Or   acetaminophen (TYLENOL) suppository 650 mg  650 mg Rectal Q6H PRN Zada Finders R, MD       alteplase (CATHFLO ACTIVASE) injection 2 mg  2 mg Intracatheter Once PRN Elmarie Shiley, MD       amLODipine (NORVASC) tablet 10 mg  10 mg Oral QHS Zada Finders R, MD   10 mg at 06/13/21 2211   Chlorhexidine Gluconate Cloth 2 % PADS 6 each  6 each Topical Q0600 Elmarie Shiley, MD   6 each at 06/14/21 [phone]   cloNIDine (CATAPRES) tablet 0.1 mg  0.1 mg Oral Once per day on Mon Wed Fri Lenore Cordia, MD  cloNIDine (CATAPRES) tablet 0.1 mg  0.1 mg Oral 2 times per day on Sun Tue Thu Sat Zada Finders R, MD   0.1 mg at 06/13/21 2211   doxercalciferol (HECTOROL) injection 3 mcg  3 mcg Intravenous Q M,W,F-HD Elmarie Shiley, MD       gabapentin (NEURONTIN) capsule 100 mg  100 mg Oral TID Lenore Cordia, MD   100 mg at 06/13/21 2211   heparin injection 1,000 Units  1,000 Units Dialysis PRN Elmarie Shiley, MD       heparin injection 5,000 Units  5,000 Units Subcutaneous Q8H Lenore Cordia, MD   5,000 Units at 06/14/21 0444   hydrALAZINE (APRESOLINE) injection 10 mg  10 mg Intravenous Q6H PRN Lenore Cordia, MD        hydrOXYzine (ATARAX) tablet 25 mg  25 mg Oral TID PRN Lenore Cordia, MD   25 mg at 06/13/21 2223   lidocaine (PF) (XYLOCAINE) 1 % injection 5 mL  5 mL Intradermal PRN Elmarie Shiley, MD       lidocaine-prilocaine (EMLA) cream 1 application  1 application Topical PRN Elmarie Shiley, MD       multivitamin (RENA-VIT) tablet 1 tablet  1 tablet Oral QHS Lenore Cordia, MD   1 tablet at 06/13/21 2211   nicotine (NICODERM CQ - dosed in mg/24 hours) patch 21 mg  21 mg Transdermal Daily Lenore Cordia, MD   21 mg at 06/13/21 2211   oxyCODONE-acetaminophen (PERCOCET/ROXICET) 5-325 MG per tablet 1 tablet  1 tablet Oral Q6H PRN Lenore Cordia, MD   1 tablet at 06/14/21 0306   pantoprazole (PROTONIX) EC tablet 40 mg  40 mg Oral BID Lenore Cordia, MD   40 mg at 06/13/21 2211   pentafluoroprop-tetrafluoroeth (GEBAUERS) aerosol 1 application  1 application Topical PRN Elmarie Shiley, MD       senna-docusate (Senokot-S) tablet 1 tablet  1 tablet Oral QHS PRN Lenore Cordia, MD       sodium chloride flush (NS) 0.9 % injection 3 mL  3 mL Intravenous Q12H Zada Finders R, MD   3 mL at 06/13/21 2224   sodium thiosulfate 25 g in sodium chloride 0.9 % 200 mL Infusion for Calciphylaxis  25 g Intravenous Q M,W,F-HD Elmarie Shiley, MD         ALLERGIES Seroquel [quetiapine] and Dilaudid [hydromorphone hcl]  MEDICAL HISTORY Past Medical History:  Diagnosis Date   Anemia    Diabetes mellitus without complication [facility])    patient denies   Dialysis patient [facility])    End stage chronic kidney disease ([place])    Hypertension    ICH (intracerebral hemorrhage) ([place]) 05/20/2017   PAD (peripheral artery disease) ([place])    Shoulder pain, left 06/28/2013     SOCIAL HISTORY Social History   Socioeconomic History   Marital status: Single    Spouse name: Not on file   Number of children: Not on file   Years of education: Not on file   Highest education level: Not on file  Occupational History   Occupation: disabled   Tobacco Use   Smoking status: Every Day    Packs/day: 0.50    Years: 45.00    Pack years: 22.50    Types: Cigarettes   Smokeless tobacco: Never  Vaping Use   Vaping Use: Never used  Substance and Sexual Activity   Alcohol use: Not Currently    Alcohol/week: 12.0 standard drinks    Types: 12 Cans of  beer per week   Drug use: Not Currently    Types: "Crack" cocaine    Comment: last in 2020   Sexual activity: Yes    Birth control/protection: None  Other Topics Concern   Not on file  Social History Narrative   Not on file   Social Determinants of Health   Financial Resource Strain: Not on file  Food Insecurity: Not on file  Transportation Needs: Not on file  Physical Activity: Not on file  Stress: Not on file  Social Connections: Not on file  Intimate Partner Violence: Not on file     FAMILY HISTORY Family History  Problem Relation Age of Onset   Colon cancer Neg Hx      Review of Systems: 12 systems were reviewed and negative except per HPI  Physical Exam: Vitals:   06/14/21 0729 06/14/21 0738  BP: (!) 154/82 (!) 152/85  Pulse: 80 76  Resp:    Temp: 98.2 F (36.8 C)   SpO2:     No intake/output data recorded.  Intake/Output Summary (Last 24 hours) at 06/14/2021 0758 Last data filed at 06/14/2021 0256 Gross per 24 hour  Intake 836 ml  Output 250 ml  Net 586 ml   General: well-appearing, in discomfort HEENT: anicteric sclera, MMM CV: normal rate, no murmurs Lungs: fine crackles bibasilar, normal wob Abd: soft, non-tender, non-distended Skin: no visible lesions or rashes Ext: left bka, 1+ RLE pitting edema Neuro: awake, alert, normal speech, no gross focal deficits  Dialysis access: RIJ TDC (in use)  Test Results Reviewed Lab Results  Component Value Date   NA 138 06/14/2021   K 3.4 (L) 06/14/2021   CL 97 (L) 06/14/2021   CO2 27 06/14/2021   BUN 23 06/14/2021   CREATININE 8.86 (H) 06/14/2021   CALCIUM 9.6 06/14/2021   ALBUMIN 2.5 (L)  06/13/2021   PHOS 3.6 04/25/2021    I have reviewed relevant outside healthcare records

## 2021-06-14 NOTE — Progress Notes (Signed)
PROGRESS NOTE    Daniel [name]  SKA:[id number] DOB: [birth year] DOA: 06/13/2021 PCP: [name], NP    Brief Narrative:  Daniel [name] is a 61 y.o. male with past medical history significant for ESRD on HD MWF, PAD s/p left BKA (04/16/2021), anemia of chronic kidney disease, HTN, ICH in 2019 who presented to [facility] ED on 2/16 from vascular surgery outpatient office for evaluation of chest pain.  Patient reports 3 days of intermittent left-sided chest pain described as a squeezing/aching sensation with radiation to his back.  Onset at rest and continues to occur at rest with associated shortness of breath and pleuritic chest discomfort.  Notes that his dyspnea is worse when he is lying flat and reports swelling to his right leg which is chronic.  Denies diaphoresis, no nausea/vomiting, no abdominal pain.  Denies any similar symptoms in the past.  Continues to endorse tobacco use, 0.5-1 pack/day but denies any alcohol or illicit drug use.  In the ED, BP 150/79, HR 84, RR 18, temperature 98.4 F, SPO2 90% on room air.  WBC 5.3, hemoglobin 7.3, platelets 179, sodium 139, potassium 3.4, bicarb 28, BUN 18, creatinine 0.86, glucose 96, high sensitive troponin 17>18.  Chest x-ray with cardiomegaly, mild pulmonary edema and trace bilateral pleural effusions.  Nephrology consulted for dialysis while inpatient with recommendation of 1 unit PRBC during HD.  TRH consulted for further evaluation and management of acute onset chest pain.    Assessment & Plan:   Assessment and Plan: * Chest pain- (present on admission) Patient presenting with 3-day history of intermittent atypical chest pain localized to his left chest with intermittent radiation towards his back.  Patient reports squeezing/pressure-like in nature at times followed by sharp in nature.  Not improved with nitroglycerin tabs.  High sensitive troponin without significant elevation, 17 and 18 on repeat.  EKG with NSR, no concerning  dynamic changes.  Chest x-ray with mild pulmonary edema.  Etiology likely secondary to pulmonary edema versus anemia.  Patient was being worked up outpatient by vascular surgery for cardiac clearance for upcoming revision of his stump wound. Patient with 3 days intermittent atypical chest pain.  Troponin without significant elevation, 17 and 18 on repeat.  EKG without acute ischemic changes.  Has pulmonary edema and peripheral edema suggestive of underlying CHF which is likely contributing to presenting symptoms.  Also may be related to worsening anemia.  Right lower extremity vascular duplex ultrasound negative for DVT. --Cardiology consulted for further evaluation and recommendations  --TTE: Pending --Transfusing 1 unit PRBC with HD today --Continue to monitor on telemetry  Acute on chronic diastolic CHF (congestive heart failure) (B and E)- (present on admission) Patient presenting with pain associated with shortness of breath.  BNP elevated 521.8 with chest x-ray findings of mild pulmonary edema. --Continue HD for volume management --Renal diet with 1200 mL fluid restriction --Strict I's and O's and daily weights  PAD (peripheral artery disease) ([place])- (present on admission) S/p left BKA 04/16/2021 by vascular surgery, Dr. Scot Dock, for nonhealing left foot wound.  Patient having continued pain at stump and vascular surgery considering revision pending further chest pain work-up. --Vascular surgery following for likely stump revision while inpatient  Anemia due to chronic kidney disease- (present on admission) Hemoglobin 7.3 on admission, baseline usually 8-9.  No obvious bleeding.  FOBT is negative.  Transfusing 1 unit PRBC with HD per nephrology. --Anemia panel: Pending --Continue home ferrous sulfate 325 mg p.o. daily --Repeat CBC in the a.m.  Essential hypertension- (  present on admission) --clonidine 0.1mg  PO daily on MWF, BID on T/T/S/S --amlodipine 10 mg p.o. daily.  IV hydralazine as  needed.  End-stage renal disease on hemodialysis [facility]) --Nephrology following for HD needs.  Tobacco use- (present on admission) Reports ongoing tobacco use, smoking 0.5-1 pack/day.  Smoking cessation advised.   --Nicotine patch  GERD (gastroesophageal reflux disease)- (present on admission) --Protonix 40 mg p.o. twice daily     DVT prophylaxis: heparin injection 5,000 Units Start: 06/13/21 2200    Code Status: Full Code Family Communication: No family present at bedside this morning  Disposition Plan:  Level of care: Telemetry Cardiac Status is: Observation The patient remains OBS appropriate and will d/c before 2 midnights.    Consultants:  Cardiology Vascular surgery Nephrology  Procedures:  TTE: Pending  Antimicrobials:  None   Subjective: Patient seen examined bedside, currently in HD receiving dialysis.  Continues with intermittent chest pain.  Reports no significant improvement with nitroglycerin.  No other specific complaints or concerns at this time.  Currently denies headache, no dizziness, no chest pain, no fever/chills/night sweats, no current shortness of breath, no abdominal pain, no palpitations, no weakness, no fatigue, no paresthesias.  No acute events overnight per nurse staff.  Objective: Vitals:   06/14/21 0955 06/14/21 1000 06/14/21 1030 06/14/21 1100  BP: (!) 170/77 (!) 178/93 (!) 149/54 (!) 147/87  Pulse: 80 80 83 86  Resp: 19 20 20  (!) 35  Temp: 98.6 F (37 C)     TempSrc:      SpO2: 100% 100% 100% 100%  Weight:      Height:        Intake/Output Summary (Last 24 hours) at 06/14/2021 1118 Last data filed at 06/14/2021 0945 Gross per 24 hour  Intake 1742.67 ml  Output 250 ml  Net 1492.67 ml   Filed Weights   06/13/21 2047 06/14/21 0258 06/14/21 0729  Weight: 123.9 kg 123.3 kg 124.1 kg    Examination:  Physical Exam: GEN: NAD, alert and oriented x 3, chronically ill appearance, appears older than stated age HEENT: NCAT, PERRL,  EOMI, sclera clear, MMM PULM: Mild crackles noted bilateral bases, no wheezing, normal respiratory effort without accessory muscle use, on 2 L nasal cannula with SPO2 100% at rest CV: RRR w/o M/G/R GI: abd soft, NTND, NABS, no R/G/M MSK: 1+ peripheral edema right lower extremity, left lower extremity BKA noted with Ace wrap in place, moves all extremities independently with strength globally intact.  NEURO: CN II-XII intact, no focal deficits, sensation to light touch intact PSYCH: normal mood/affect Integumentary: Right stump wound noted with ace wrap in place, otherwise no concerning lesions/rashes/wounds     Data Reviewed: I have personally reviewed following labs and imaging studies  CBC: Recent Labs  Lab 06/13/21 1105 06/14/21 0356  WBC 5.3 4.4  NEUTROABS 2.4  --   HGB 7.3* 7.1*  HCT 23.4* 22.7*  MCV 107.8* 108.6*  PLT 179 811   Basic Metabolic Panel: Recent Labs  Lab 06/13/21 1105 06/14/21 0356  NA 139 138  K 3.4* 3.4*  CL 97* 97*  CO2 28 27  GLUCOSE 96 126*  BUN 18 23  CREATININE 7.86* 8.86*  CALCIUM 9.4 9.6   GFR: Estimated Creatinine Clearance: 11.7 mL/min (A) (by C-G formula based on SCr of 8.86 mg/dL (H)). Liver Function Tests: Recent Labs  Lab 06/13/21 1105  AST 39  ALT 12  ALKPHOS 67  BILITOT 0.5  PROT 8.4*  ALBUMIN 2.5*   No results  for input(s): LIPASE, AMYLASE in the last 168 hours. No results for input(s): AMMONIA in the last 168 hours. Coagulation Profile: No results for input(s): INR, PROTIME in the last 168 hours. Cardiac Enzymes: No results for input(s): CKTOTAL, CKMB, CKMBINDEX, TROPONINI in the last 168 hours. BNP (last 3 results) No results for input(s): PROBNP in the last 8760 hours. HbA1C: No results for input(s): HGBA1C in the last 72 hours. CBG: Recent Labs  Lab 06/13/21 2046  GLUCAP 123*   Lipid Profile: No results for input(s): CHOL, HDL, LDLCALC, TRIG, CHOLHDL, LDLDIRECT in the last 72 hours. Thyroid Function  Tests: No results for input(s): TSH, T4TOTAL, FREET4, T3FREE, THYROIDAB in the last 72 hours. Anemia Panel: No results for input(s): VITAMINB12, FOLATE, FERRITIN, TIBC, IRON, RETICCTPCT in the last 72 hours. Sepsis Labs: No results for input(s): PROCALCITON, LATICACIDVEN in the last 168 hours.  Recent Results (from the past 240 hour(s))  Resp Panel by RT-PCR (Flu A&B, Covid) Nasopharyngeal Swab     Status: None   Collection Time: 06/13/21  6:40 PM   Specimen: Nasopharyngeal Swab; Nasopharyngeal(NP) swabs in vial transport medium  Result Value Ref Range Status   SARS Coronavirus 2 by RT PCR NEGATIVE NEGATIVE Final    Comment: (NOTE) SARS-CoV-2 target nucleic acids are NOT DETECTED.  The SARS-CoV-2 RNA is generally detectable in upper respiratory specimens during the acute phase of infection. The lowest concentration of SARS-CoV-2 viral copies this assay can detect is 138 copies/mL. A negative result does not preclude SARS-Cov-2 infection and should not be used as the sole basis for treatment or other patient management decisions. A negative result may occur with  improper specimen collection/handling, submission of specimen other than nasopharyngeal swab, presence of viral mutation(s) within the areas targeted by this assay, and inadequate number of viral copies(<138 copies/mL). A negative result must be combined with clinical observations, patient history, and epidemiological information. The expected result is Negative.  Fact Sheet for Patients:  EntrepreneurPulse.com.au  Fact Sheet for Healthcare Providers:  IncredibleEmployment.be  This test is no t yet approved or cleared by the [place] FDA and  has been authorized for detection and/or diagnosis of SARS-CoV-2 by FDA under an Emergency Use Authorization (EUA). This EUA will remain  in effect (meaning this test can be used) for the duration of the COVID-19 declaration under Section  564(b)(1) of the Act, 21 U.S.C.section 360bbb-3(b)(1), unless the authorization is terminated  or revoked sooner.       Influenza A by PCR NEGATIVE NEGATIVE Final   Influenza B by PCR NEGATIVE NEGATIVE Final    Comment: (NOTE) The Xpert Xpress SARS-CoV-2/FLU/RSV plus assay is intended as an aid in the diagnosis of influenza from Nasopharyngeal swab specimens and should not be used as a sole basis for treatment. Nasal washings and aspirates are unacceptable for Xpert Xpress SARS-CoV-2/FLU/RSV testing.  Fact Sheet for Patients: EntrepreneurPulse.com.au  Fact Sheet for Healthcare Providers: IncredibleEmployment.be  This test is not yet approved or cleared by the [place] FDA and has been authorized for detection and/or diagnosis of SARS-CoV-2 by FDA under an Emergency Use Authorization (EUA). This EUA will remain in effect (meaning this test can be used) for the duration of the COVID-19 declaration under Section 564(b)(1) of the Act, 21 U.S.C. section 360bbb-3(b)(1), unless the authorization is terminated or revoked.  Performed at [facility], [place] [address], [place], [place] [address]   MRSA Next Gen by PCR, Nasal     Status: None   Collection Time:  06/14/21  4:12 AM   Specimen: Nasal Mucosa; Nasal Swab  Result Value Ref Range Status   MRSA by PCR Next Gen NOT DETECTED NOT DETECTED Final    Comment: (NOTE) The GeneXpert MRSA Assay (FDA approved for NASAL specimens only), is one component of a comprehensive MRSA colonization surveillance program. It is not intended to diagnose MRSA infection nor to guide or monitor treatment for MRSA infections. Test performance is not FDA approved in patients less than 28 years old. Performed at [facility], [place] [address], [place], [place] [address]          Radiology Studies: DG Chest 2 View  Result Date: 06/13/2021 CLINICAL DATA:  Chest pain EXAM: CHEST - 2 VIEW COMPARISON:   None. FINDINGS: Right neck catheter tip overlies the mid superior vena cava. Unchanged, enlarged cardiac silhouette. There are diffuse interstitial opacities. There are lower lung alveolar opacities. Trace effusions. No visible pneumothorax. No acute osseous abnormality. Left axillary stent. IMPRESSION: Mild pulmonary edema and trace bilateral pleural effusions. Cardiomegaly. Electronically Signed   By: Maurine Simmering M.D.   On: 06/13/2021 11:31        Scheduled Meds:  sodium chloride   Intravenous Once   amLODipine  10 mg Oral QHS   calcium acetate  667 mg Oral TID WC   Chlorhexidine Gluconate Cloth  6 each Topical Q0600   cloNIDine  0.1 mg Oral Once per day on Mon Wed Fri   cloNIDine  0.1 mg Oral 2 times per day on Sun Tue Thu Sat   doxercalciferol  3 mcg Intravenous Q M,W,F-HD   ferrous sulfate  325 mg Oral q morning   gabapentin  100 mg Oral TID   heparin  5,000 Units Subcutaneous Q8H   multivitamin  1 tablet Oral QHS   nicotine  21 mg Transdermal Daily   pantoprazole  40 mg Oral BID   sodium chloride flush  3 mL Intravenous Q12H   Continuous Infusions:  sodium chloride     sodium chloride     sodium thiosulfate infusion for calciphylaxis 25 g (06/14/21 1018)     LOS: 0 days    Time spent: 49 minutes spent on chart review, discussion with nursing staff, consultants, updating family and interview/physical exam; more than 50% of that time was spent in counseling and/or coordination of care.    Kushal Saunders J [place] DO [facility] Available via Epic secure chat 7am-7pm After these hours, please refer to coverage provider listed on [email or web address] 06/14/2021, 11:18 AM

## 2021-06-14 NOTE — Progress Notes (Signed)
Per blood bank, blood is ready for whenever patient goes to HD.

## 2021-06-14 NOTE — Progress Notes (Signed)
Patient tells NT that he is having 10/10 chest pain. RN assessed patient at the bedside. Patient asked for "the muscle spasm medicine." MD has put prn PO robaxin to be given for muscle spasms. RN spoke to pharmacy about giving the PO robaxin too soon when the IVPB robaxin was given at 1400. Terrence Dupont, [facility] relayed to the RN and MD that the patient has impaired renal function, RPH recommends the earliest the patient can receive the PO robaxin post IVPB robaxin is at 2200. Patient expressed "I will settle for the percocet since I can not have muscle spasm medicine". RN gave patient the prn percocet. Oncoming RN made aware of the timing to give PO robaxin.

## 2021-06-14 NOTE — Progress Notes (Signed)
Patient calls nurses station and tells NS that he is having 10/10 chest pain. RN assessed patient, obtained a set of VS and EKG. EKG WDL and VS stable. Patient was given 1 x SL NTG. After 5 mins, patient still express 10/10 chest pain. RN paged [place] MD and Rapid RN. [place] MD ordered one time dose of IVPB robaxin to be given. RN spoke with 3E pharmacist about IVPB robaxin being sent to the unit. RN will continue to monitor for any changes.

## 2021-06-14 NOTE — Assessment & Plan Note (Addendum)
Patient was found volume overloaded.  Nephrology was consulted and patient underwent  hemodialysis and ultrafiltration with improvement in his volume status.   Continue amlodipine, clonidine.

## 2021-06-15 DIAGNOSIS — I10 Essential (primary) hypertension: Secondary | ICD-10-CM | POA: Diagnosis not present

## 2021-06-15 DIAGNOSIS — N186 End stage renal disease: Secondary | ICD-10-CM | POA: Diagnosis not present

## 2021-06-15 DIAGNOSIS — R079 Chest pain, unspecified: Secondary | ICD-10-CM | POA: Diagnosis not present

## 2021-06-15 DIAGNOSIS — I5033 Acute on chronic diastolic (congestive) heart failure: Secondary | ICD-10-CM | POA: Diagnosis not present

## 2021-06-15 LAB — BPAM RBC
Blood Product Expiration Date: 202302242359
ISSUE DATE / TIME: 202302170826
Unit Type and Rh: 7300

## 2021-06-15 LAB — TYPE AND SCREEN
ABO/RH(D): B POS
Antibody Screen: NEGATIVE
Unit division: 0

## 2021-06-15 LAB — RENAL FUNCTION PANEL
Albumin: 2.3 g/dL — ABNORMAL LOW (ref 3.5–5.0)
Anion gap: 13 (ref 5–15)
BUN: 17 mg/dL (ref 8–23)
CO2: 25 mmol/L (ref 22–32)
Calcium: 9.2 mg/dL (ref 8.9–10.3)
Chloride: 95 mmol/L — ABNORMAL LOW (ref 98–111)
Creatinine, Ser: 6.01 mg/dL — ABNORMAL HIGH (ref 0.61–1.24)
GFR, Estimated: 10 mL/min — ABNORMAL LOW (ref >60)
Glucose, Bld: 93 mg/dL (ref 70–99)
Phosphorus: 3.2 mg/dL (ref 2.5–4.6)
Potassium: 3.4 mmol/L — ABNORMAL LOW (ref 3.5–5.1)
Sodium: 133 mmol/L — ABNORMAL LOW (ref 135–145)

## 2021-06-15 LAB — HEPATITIS B SURFACE ANTIBODY, QUANTITATIVE: Hep B S AB Quant (Post): 41.9 m[IU]/mL (ref >9.9)

## 2021-06-15 LAB — CBC
HCT: 24.5 % — ABNORMAL LOW (ref 39.0–52.0)
Hemoglobin: 8.2 g/dL — ABNORMAL LOW (ref 13.0–17.0)
MCH: 34 pg (ref 26.0–34.0)
MCHC: 33.5 g/dL (ref 30.0–36.0)
MCV: 101.7 fL — ABNORMAL HIGH (ref 80.0–100.0)
Platelets: 171 K/uL (ref 150–400)
RBC: 2.41 MIL/uL — ABNORMAL LOW (ref 4.22–5.81)
RDW: 19.9 % — ABNORMAL HIGH (ref 11.5–15.5)
WBC: 5.4 K/uL (ref 4.0–10.5)
nRBC: 0.4 % — ABNORMAL HIGH (ref 0.0–0.2)

## 2021-06-15 NOTE — Progress Notes (Signed)
[facility] Progress Note    Assessment/ Plan:   # ESRD:  -outpatient HD orders: [facility]. MWF. 4hrs, Nipro, 450/500, EDW 125 kg, 2K, 2 Cal.  No heparin.  Mircera 225 mcg every 2 weeks (last dose: 2/10), Hectorol 71mcg qtx -HD today on schedule, next HD planned for Mon   # Chest pain -troponins not significantly elevated -possible related to underlying CHF and/or anemia -chest pain resolved -per pmd  # PAD -VVS following, planning for stump revision on Monday   # Volume/ hypertension: EDW 125kg.  UF as tolerated. -Resume home antihypertensives   # Symptomatic anemia, anemia of Chronic Kidney Disease, macrocytosis: Hemoglobin 7.3 on admission . Anemia ahs been an ongoing issue. Currently receiving max dose esa as an outpatient. FOBT neg. R/O bleed per pmd. Iron replete on panel -s/p 1u prbc w/ HD 2/17, hgb up to 8.2 today -not due for ESA yet   # Secondary Hyperparathyroidism/Hyperphosphatemia: check phos, on nathiosulfate   # Vascular access: active access- TDC   Gean Quint, MD [facility]  Subjective:   Seen on hd. Having intermittent RLE pain (chronic issue). Otherwise tolerating HD. Chest pain resolved as per patient.   Objective:   BP (!) 152/75    Pulse 83    Temp 97.7 F (36.5 C)    Resp 12    Ht 5\' 11"  (1.803 m)    Wt 120.3 kg    SpO2 100%    BMI 36.99 kg/m   Intake/Output Summary (Last 24 hours) at 06/15/2021 1011 Last data filed at 06/15/2021 0500 Gross per 24 hour  Intake 843.03 ml  Output 2475 ml  Net -1631.97 ml   Weight change: 0.2 kg  Physical Exam: Gen:nad CVS:rrr Resp:normal wob VQQ:VZDG LOV:FIEP bka, trace edema RLE Neuro: awake, alert Dialysis access: RIJ TDC (in use)  Imaging: DG Chest 2 View  Result Date: 06/13/2021 CLINICAL DATA:  Chest pain EXAM: CHEST - 2 VIEW COMPARISON:  None. FINDINGS: Right neck catheter tip overlies the mid superior vena cava. Unchanged, enlarged cardiac silhouette. There are diffuse  interstitial opacities. There are lower lung alveolar opacities. Trace effusions. No visible pneumothorax. No acute osseous abnormality. Left axillary stent. IMPRESSION: Mild pulmonary edema and trace bilateral pleural effusions. Cardiomegaly. Electronically Signed   By: Maurine Simmering M.D.   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 EXAM: CT OF THE LOWER LEFT EXTREMITY WITH CONTRAST TECHNIQUE: Multidetector CT imaging of the lower left extremity was performed according to the standard protocol following intravenous contrast administration. RADIATION DOSE REDUCTION: This exam was performed according to the departmental dose-optimization program which includes automated exposure control, adjustment of the mA and/or kV according to patient size and/or use of iterative reconstruction technique. CONTRAST:  144mL OMNIPAQUE IOHEXOL 350 MG/ML SOLN COMPARISON:  CT examination dated January 05, 2021 FINDINGS: Bones/Joint/Cartilage Status post below-knee amputation. No fracture or dislocation. No cortical erosion or periosteal reaction. 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 Normal alignment. No joint effusion. Ligaments Ligaments are suboptimally evaluated by CT. Muscles and Tendons No intramuscular hematoma fluid collection. Soft tissue Mild skin thickening and subcutaneous soft tissue edema consistent with mild cellulitis. No drainable fluid collection or abscess. Prominent vascular calcifications. IMPRESSION: 1. Postsurgical changes for prior below-knee amputation without evidence of fracture or acute osseous abnormality. 2. No evidence of osteomyelitis. 3. Mild subcutaneous soft tissue swelling  and edema without evidence of drainable fluid collection or abscess. 4. Prominent vascular calcifications. Electronically Signed   By: Keane Police D.O.   On: 06/14/2021 18:52    ECHOCARDIOGRAM COMPLETE  Result Date: 06/14/2021    ECHOCARDIOGRAM REPORT   Patient Name:   Daniel [name] Date of Exam: 06/14/2021 Medical Rec #:  [id number]          Height:       71.0 in Accession #:    [id number]         Weight:       273.6 lb Date of Birth:  [birth year]          BSA:          2.409 m Patient Age:    61 years           BP:           171/75 mmHg Patient Gender: M                  HR:           87 bpm. Exam Location:  Inpatient Procedure: 2D Echo Indications:    Chest pain  History:        Patient has prior history of Echocardiogram examinations, most                 recent 10/28/2020. CHF; Risk Factors:Hypertension.  Sonographer:    Jefferey Pica Referring Phys: [id number] [place]  1. Left ventricular ejection fraction, by estimation, is 55 to 60%. The left ventricle has normal function. The left ventricle has no regional wall motion abnormalities. The left ventricular internal cavity size was mildly dilated. There is mild left ventricular hypertrophy. Left ventricular diastolic parameters are indeterminate.  2. Right ventricular systolic function is normal. The right ventricular size is normal. There is moderately elevated pulmonary artery systolic pressure. The estimated right ventricular systolic pressure is 42.7 mmHg.  3. Left atrial size was moderately dilated.  4. Right atrial size was mildly dilated.  5. The mitral valve is normal in structure. Trivial mitral valve regurgitation. No evidence of mitral stenosis.  6. The aortic valve is tricuspid. Aortic valve regurgitation is not visualized. Aortic valve sclerosis/calcification is present, without any evidence of aortic stenosis.  7. The inferior vena cava is dilated in size with <50% respiratory variability, suggesting right atrial pressure of 15 mmHg. FINDINGS  Left Ventricle: Left ventricular ejection fraction, by estimation, is 55 to 60%. The left ventricle has normal function. The left ventricle has no regional  wall motion abnormalities. The left ventricular internal cavity size was mildly dilated. There is  mild left ventricular hypertrophy. Left ventricular diastolic parameters are indeterminate. Right Ventricle: The right ventricular size is normal. No increase in right ventricular wall thickness. Right ventricular systolic function is normal. There is moderately elevated pulmonary artery systolic pressure. The tricuspid regurgitant velocity is 2.89 m/s, and with an assumed right atrial pressure of 15 mmHg, the estimated right ventricular systolic pressure is 06.2 mmHg. Left Atrium: Left atrial size was moderately dilated. Right Atrium: Right atrial size was mildly dilated. Pericardium: Trivial pericardial effusion is present. Mitral Valve: The mitral valve is normal in structure. Trivial mitral valve regurgitation. No evidence of mitral valve stenosis. Tricuspid Valve: The tricuspid valve is normal in structure. Tricuspid valve regurgitation is trivial. Aortic Valve: The aortic valve is tricuspid. Aortic valve regurgitation is not visualized. Aortic valve sclerosis/calcification is present, without any evidence of aortic stenosis. Aortic  valve peak gradient measures 13.2 mmHg. Pulmonic Valve: The pulmonic valve was normal in structure. Pulmonic valve regurgitation is not visualized. Aorta: The aortic root is normal in size and structure. Venous: The inferior vena cava is dilated in size with less than 50% respiratory variability, suggesting right atrial pressure of 15 mmHg. IAS/Shunts: No atrial level shunt detected by color flow Doppler.  LEFT VENTRICLE PLAX 2D LVIDd:         6.50 cm   Diastology LVIDs:         3.80 cm   LV e' medial:    7.58 cm/s LV PW:         1.20 cm   LV E/e' medial:  17.2 LV IVS:        1.20 cm   LV e' lateral:   10.70 cm/s LVOT diam:     2.00 cm   LV E/e' lateral: 12.1 LV SV:         102 LV SV Index:   42 LVOT Area:     3.14 cm  RIGHT VENTRICLE             IVC RV S prime:     16.10 cm/s  IVC  diam: 2.40 cm TAPSE (M-mode): 3.2 cm LEFT ATRIUM              Index        RIGHT ATRIUM           Index LA diam:        4.50 cm  1.87 cm/m   RA Area:     18.40 cm LA Vol (A2C):   110.0 ml 45.66 ml/m  RA Volume:   51.00 ml  21.17 ml/m LA Vol (A4C):   95.8 ml  39.77 ml/m LA Biplane Vol: 107.0 ml 44.42 ml/m  AORTIC VALVE                  PULMONIC VALVE AV Area (Vmax): 2.87 cm      PV Vmax:       1.14 m/s AV Vmax:        182.00 cm/s   PV Peak grad:  5.2 mmHg AV Peak Grad:   13.2 mmHg LVOT Vmax:      166.00 cm/s LVOT Vmean:     109.000 cm/s LVOT VTI:       0.325 m  AORTA Ao Root diam: 3.20 cm Ao Asc diam:  3.60 cm MITRAL VALVE                TRICUSPID VALVE MV Area (PHT): 4.21 cm     TR Peak grad:   33.4 mmHg MV Decel Time: 180 msec     TR Vmax:        289.00 cm/s MV E velocity: 130.00 cm/s MV A velocity: 93.00 cm/s   SHUNTS MV E/A ratio:  1.40         Systemic VTI:  0.32 m                             Systemic Diam: 2.00 cm Dalton McleanMD Electronically signed by Franki Monte Signature Date/Time: 06/14/2021/4:19:14 PM    Final    VAS [place] LOWER EXTREMITY VENOUS (DVT)  Result Date: 06/14/2021  Lower Venous DVT Study Patient Name:  Daniel [name]  Date of Exam:   06/14/2021 Medical Rec #: [id number]           Accession #:  [id number] Date of Birth: [birth year]           Patient Gender: M Patient Age:   11 years Exam Location:  [facility] Procedure:      VAS [place] LOWER EXTREMITY VENOUS (DVT) Referring Phys: VISHAL PATEL --------------------------------------------------------------------------------  Indications: Pain, and Swelling.  Comparison Study: Previous exam 04/14/21 was negative for DVT Performing Technologist: Rogelia Rohrer RVT, RDMS  Examination Guidelines: A complete evaluation includes B-mode imaging, spectral Doppler, color Doppler, and power Doppler as needed of all accessible portions of each vessel. Bilateral testing is considered an integral part of a complete examination. Limited  examinations for reoccurring indications may be performed as noted. The reflux portion of the exam is performed with the patient in reverse Trendelenburg.  +---------+---------------+---------+-----------+----------+-------------------+  RIGHT     Compressibility Phasicity Spontaneity Properties Thrombus Aging       +---------+---------------+---------+-----------+----------+-------------------+  CFV       Full            Yes       Yes                                         +---------+---------------+---------+-----------+----------+-------------------+  SFJ       Full                                                                  +---------+---------------+---------+-----------+----------+-------------------+  FV Prox   Full            Yes       Yes                                         +---------+---------------+---------+-----------+----------+-------------------+  FV Mid    Full            Yes       Yes                                         +---------+---------------+---------+-----------+----------+-------------------+  FV Distal Full            Yes       Yes                                         +---------+---------------+---------+-----------+----------+-------------------+  PFV       Full                                                                  +---------+---------------+---------+-----------+----------+-------------------+  POP       Full            Yes       Yes                                         +---------+---------------+---------+-----------+----------+-------------------+  PTV       Full                                                                  +---------+---------------+---------+-----------+----------+-------------------+  PERO                                                       Not well visualized  +---------+---------------+---------+-----------+----------+-------------------+   +----+---------------+---------+-----------+----------+--------------+   LEFT Compressibility Phasicity Spontaneity Properties Thrombus Aging  +----+---------------+---------+-----------+----------+--------------+  CFV  Full            Yes       Yes                                    +----+---------------+---------+-----------+----------+--------------+     Summary: RIGHT: - No evidence of deep vein thrombosis in the lower extremity. No indirect evidence of obstruction proximal to the inguinal ligament. - No evidence of common femoral vein obstruction. - No cystic structure found in the popliteal fossa.  LEFT: - No evidence of common femoral vein obstruction.  *See table(s) above for measurements and observations. Electronically signed by Orlie Pollen on 06/14/2021 at 6:34:47 PM.    Final     Labs: BMET Recent Labs  Lab 06/13/21 1105 06/14/21 0356 06/15/21 0339  NA 139 138 133*  K 3.4* 3.4* 3.4*  CL 97* 97* 95*  CO2 28 27 25   GLUCOSE 96 126* 93  BUN 18 23 17   CREATININE 7.86* 8.86* 6.01*  CALCIUM 9.4 9.6 9.2  PHOS  --   --  3.2   CBC Recent Labs  Lab 06/13/21 1105 06/14/21 0356 06/15/21 0339  WBC 5.3 4.4 5.4  NEUTROABS 2.4  --   --   HGB 7.3* 7.1* 8.2*  HCT 23.4* 22.7* 24.5*  MCV 107.8* 108.6* 101.7*  PLT 179 172 171    Medications:     amLODipine  10 mg Oral QHS   calcium acetate  667 mg Oral TID WC   Chlorhexidine Gluconate Cloth  6 each Topical Q0600   cloNIDine  0.1 mg Oral Once per day on Mon Wed Fri   cloNIDine  0.1 mg Oral 2 times per day on Sun Tue Thu Sat   doxercalciferol  3 mcg Intravenous Q M,W,F-HD   ferrous sulfate  325 mg Oral q morning   gabapentin  100 mg Oral TID   heparin  5,000 Units Subcutaneous Q8H   multivitamin  1 tablet Oral QHS   nicotine  21 mg Transdermal Daily   pantoprazole  40 mg Oral BID   sodium chloride flush  3 mL Intravenous Q12H      Gean Quint, MD [facility] 06/15/2021, 10:11 AM

## 2021-06-15 NOTE — Progress Notes (Signed)
PROGRESS NOTE    Daniel [name]  NFA:[id number] DOB: 1960-06-19 DOA: 06/13/2021 PCP: [name], NP    Brief Narrative:  Daniel [name] is a 61 y.o. male with past medical history significant for ESRD on HD MWF, PAD s/p left BKA (04/16/2021), anemia of chronic kidney disease, HTN, ICH in 2019 who presented to [facility] ED on 2/16 from vascular surgery outpatient office for evaluation of chest pain.  Patient reports 3 days of intermittent left-sided chest pain described as a squeezing/aching sensation with radiation to his back.  Onset at rest and continues to occur at rest with associated shortness of breath and pleuritic chest discomfort.  Notes that his dyspnea is worse when he is lying flat and reports swelling to his right leg which is chronic.  Denies diaphoresis, no nausea/vomiting, no abdominal pain.  Denies any similar symptoms in the past.  Continues to endorse tobacco use, 0.5-1 pack/day but denies any alcohol or illicit drug use.  In the ED, BP 150/79, HR 84, RR 18, temperature 98.4 F, SPO2 90% on room air.  WBC 5.3, hemoglobin 7.3, platelets 179, sodium 139, potassium 3.4, bicarb 28, BUN 18, creatinine 0.86, glucose 96, high sensitive troponin 17>18.  Chest x-ray with cardiomegaly, mild pulmonary edema and trace bilateral pleural effusions.  Nephrology consulted for dialysis while inpatient with recommendation of 1 unit PRBC during HD.  TRH consulted for further evaluation and management of acute onset chest pain.    Assessment & Plan:   Assessment and Plan: * Chest pain- (present on admission) Patient presenting with 3-day history of intermittent atypical chest pain localized to his left chest with intermittent radiation towards his back.  Patient reports squeezing/pressure-like in nature at times followed by sharp in nature.  Not improved with nitroglycerin tabs.  High sensitive troponin without significant elevation, 17 and 18 on repeat.  EKG with NSR, no concerning  dynamic changes.  Chest x-ray with mild pulmonary edema.  Etiology likely secondary to pulmonary edema versus anemia.  Patient was being worked up outpatient by vascular surgery for cardiac clearance for upcoming revision of his stump wound.  Etiology of his chest pain likely secondary to volume overload with pulmonary edema coupled with likely musculoskeletal pain with muscle spasm as chest discomfort improved with muscle relaxant. Patient with 3 days intermittent atypical chest pain.  Troponin without significant elevation, 17 and 18 on repeat.  EKG without acute ischemic changes.  Has pulmonary edema and peripheral edema suggestive of underlying CHF which is likely contributing to presenting symptoms.  Also may be related to worsening anemia.  Right lower extremity vascular duplex ultrasound negative for DVT.  TTE with LVEF 55-60%, LV mildly dilated, biatrial enlargement, IVC dilated.  Transfuse 1 unit PRBC on 2/17. --Cardiology following, appreciate assistance --Continue aggressive volume management with HD --Robaxin as needed --Continue to monitor on telemetry  Acute on chronic diastolic CHF (congestive heart failure) ([place])- (present on admission) Patient presenting with pain associated with shortness of breath.  BNP elevated 521.8 with chest x-ray findings of mild pulmonary edema. --Continue HD for volume management --Renal diet with 1200 mL fluid restriction --Strict I's and O's and daily weights  PAD (peripheral artery disease) ([place])- (present on admission) S/p left BKA 04/16/2021 by vascular surgery, Dr. Scot Dock, for nonhealing left foot wound.  Patient having continued pain at stump and vascular surgery considering revision pending further chest pain work-up.  CT tib/fib left with no osteomyelitis, no fluid collection or abscess --Vascular surgery, plan stump revision on  2/20  Anemia due to chronic kidney disease- (present on admission) Hemoglobin 7.3 on admission, baseline usually 8-9.   No obvious bleeding.  FOBT is negative.  Transfusing 1 unit PRBC with HD per nephrology.  Anemia panel with iron 259, TIBC 357, ferritin 125; consistent with anemia of chronic medical disease. --Hgb 7.3>8.2 --Continue home ferrous sulfate 325 mg p.o. daily  Essential hypertension- (present on admission) --Clonidine 0.1mg  PO daily on MWF, BID on T/T/S/S --Amlodipine 10 mg p.o. daily.   --Hydralazine 10mg  IV q6h PRN SBP >180 or DBP >110.  End-stage renal disease on hemodialysis [facility]) --Nephrology following for HD needs; receiving HD this morning  Tobacco use- (present on admission) Reports ongoing tobacco use, smoking 0.5-1 pack/day.  Smoking cessation advised.   --Nicotine patch  GERD (gastroesophageal reflux disease)- (present on admission) --Protonix 40 mg p.o. twice daily     DVT prophylaxis: heparin injection 5,000 Units Start: 06/13/21 2200    Code Status: Full Code Family Communication: No family present at bedside this morning  Disposition Plan:  Level of care: Telemetry Cardiac Status is: Observation The patient remains OBS appropriate and will d/c before 2 midnights.    Consultants:  Cardiology Vascular surgery Nephrology  Procedures:  TTE: Pending  Antimicrobials:  None   Subjective: Patient seen examined bedside, currently in HD receiving dialysis this morning.  Currently chest pain resolved, improved with Robaxin yesterday, suspect multifactorial with pulmonary edema/vascular congestion in addition to muscle spasm.  Asking timing of stump revision, vascular surgery plans on Monday.  No other questions or concerns at this time.  Specifically denies headache, no visual changes, no current chest pain, no palpitations, no shortness of breath, no abdominal pain, no fever/chills/night sweats, no nausea/vomiting/diarrhea, no weakness, no fatigue, no paresthesias.  No acute events overnight per nursing staff.  Objective: Vitals:   06/15/21 0830 06/15/21 0900  06/15/21 0930 06/15/21 1000  BP: (!) 168/63 (!) 149/68 (!) 164/62 (!) 152/75  Pulse: 80 83 84 83  Resp: 11 15 (!) 23 12  Temp:      TempSrc:      SpO2:      Weight:      Height:        Intake/Output Summary (Last 24 hours) at 06/15/2021 1019 Last data filed at 06/15/2021 0500 Gross per 24 hour  Intake 843.03 ml  Output 2475 ml  Net -1631.97 ml   Filed Weights   06/14/21 1124 06/15/21 0424 06/15/21 0706  Weight: 122.4 kg 120.2 kg 120.3 kg    Examination:  Physical Exam: GEN: NAD, alert and oriented x 3, chronically ill appearance, appears older than stated age HEENT: NCAT, PERRL, EOMI, sclera clear, MMM PULM: Mild crackles noted bilateral bases, no wheezing, normal respiratory effort without accessory muscle use, on room air with SPO2 96% at rest CV: RRR w/o M/G/R GI: abd soft, NTND, NABS, no R/G/M MSK: 1+ peripheral edema right lower extremity, left lower extremity BKA noted with Ace wrap in place, moves all extremities independently with strength globally intact.  NEURO: CN II-XII intact, no focal deficits, sensation to light touch intact PSYCH: normal mood/affect Integumentary: Right stump wound noted with ace wrap in place, otherwise no concerning lesions/rashes/wounds     Data Reviewed: I have personally reviewed following labs and imaging studies  CBC: Recent Labs  Lab 06/13/21 1105 06/14/21 0356 06/15/21 0339  WBC 5.3 4.4 5.4  NEUTROABS 2.4  --   --   HGB 7.3* 7.1* 8.2*  HCT 23.4* 22.7* 24.5*  MCV  107.8* 108.6* 101.7*  PLT 179 172 177   Basic Metabolic Panel: Recent Labs  Lab 06/13/21 1105 06/14/21 0356 06/15/21 0339  NA 139 138 133*  K 3.4* 3.4* 3.4*  CL 97* 97* 95*  CO2 28 27 25   GLUCOSE 96 126* 93  BUN 18 23 17   CREATININE 7.86* 8.86* 6.01*  CALCIUM 9.4 9.6 9.2  PHOS  --   --  3.2   GFR: Estimated Creatinine Clearance: 17 mL/min (A) (by C-G formula based on SCr of 6.01 mg/dL (H)). Liver Function Tests: Recent Labs  Lab 06/13/21 1105  06/15/21 0339  AST 39  --   ALT 12  --   ALKPHOS 67  --   BILITOT 0.5  --   PROT 8.4*  --   ALBUMIN 2.5* 2.3*   No results for input(s): LIPASE, AMYLASE in the last 168 hours. No results for input(s): AMMONIA in the last 168 hours. Coagulation Profile: No results for input(s): INR, PROTIME in the last 168 hours. Cardiac Enzymes: No results for input(s): CKTOTAL, CKMB, CKMBINDEX, TROPONINI in the last 168 hours. BNP (last 3 results) No results for input(s): PROBNP in the last 8760 hours. HbA1C: No results for input(s): HGBA1C in the last 72 hours. CBG: Recent Labs  Lab 06/13/21 2046  GLUCAP 123*   Lipid Profile: No results for input(s): CHOL, HDL, LDLCALC, TRIG, CHOLHDL, LDLDIRECT in the last 72 hours. Thyroid Function Tests: No results for input(s): TSH, T4TOTAL, FREET4, T3FREE, THYROIDAB in the last 72 hours. Anemia Panel: Recent Labs    06/14/21 1139  FERRITIN 125  TIBC 357  IRON 259*   Sepsis Labs: No results for input(s): PROCALCITON, LATICACIDVEN in the last 168 hours.  Recent Results (from the past 240 hour(s))  Resp Panel by RT-PCR (Flu A&B, Covid) Nasopharyngeal Swab     Status: None   Collection Time: 06/13/21  6:40 PM   Specimen: Nasopharyngeal Swab; Nasopharyngeal(NP) swabs in vial transport medium  Result Value Ref Range Status   SARS Coronavirus 2 by RT PCR NEGATIVE NEGATIVE Final    Comment: (NOTE) SARS-CoV-2 target nucleic acids are NOT DETECTED.  The SARS-CoV-2 RNA is generally detectable in upper respiratory specimens during the acute phase of infection. The lowest concentration of SARS-CoV-2 viral copies this assay can detect is 138 copies/mL. A negative result does not preclude SARS-Cov-2 infection and should not be used as the sole basis for treatment or other patient management decisions. A negative result may occur with  improper specimen collection/handling, submission of specimen other than nasopharyngeal swab, presence of viral  mutation(s) within the areas targeted by this assay, and inadequate number of viral copies(<138 copies/mL). A negative result must be combined with clinical observations, patient history, and epidemiological information. The expected result is Negative.  Fact Sheet for Patients:  EntrepreneurPulse.com.au  Fact Sheet for Healthcare Providers:  IncredibleEmployment.be  This test is no t yet approved or cleared by the [place] FDA and  has been authorized for detection and/or diagnosis of SARS-CoV-2 by FDA under an Emergency Use Authorization (EUA). This EUA will remain  in effect (meaning this test can be used) for the duration of the COVID-19 declaration under Section 564(b)(1) of the Act, 21 U.S.C.section 360bbb-3(b)(1), unless the authorization is terminated  or revoked sooner.       Influenza A by PCR NEGATIVE NEGATIVE Final   Influenza B by PCR NEGATIVE NEGATIVE Final    Comment: (NOTE) The Xpert Xpress SARS-CoV-2/FLU/RSV plus assay is intended as an aid in  the diagnosis of influenza from Nasopharyngeal swab specimens and should not be used as a sole basis for treatment. Nasal washings and aspirates are unacceptable for Xpert Xpress SARS-CoV-2/FLU/RSV testing.  Fact Sheet for Patients: EntrepreneurPulse.com.au  Fact Sheet for Healthcare Providers: IncredibleEmployment.be  This test is not yet approved or cleared by the [place] FDA and has been authorized for detection and/or diagnosis of SARS-CoV-2 by FDA under an Emergency Use Authorization (EUA). This EUA will remain in effect (meaning this test can be used) for the duration of the COVID-19 declaration under Section 564(b)(1) of the Act, 21 U.S.C. section 360bbb-3(b)(1), unless the authorization is terminated or revoked.  Performed at [facility], [place] [address] [place], [place] [address]   MRSA Next Gen by PCR, Nasal      Status: None   Collection Time: 06/14/21  4:12 AM   Specimen: Nasal Mucosa; Nasal Swab  Result Value Ref Range Status   MRSA by PCR Next Gen NOT DETECTED NOT DETECTED Final    Comment: (NOTE) The GeneXpert MRSA Assay (FDA approved for NASAL specimens only), is one component of a comprehensive MRSA colonization surveillance program. It is not intended to diagnose MRSA infection nor to guide or monitor treatment for MRSA infections. Test performance is not FDA approved in patients less than 86 years old. Performed at [facility], [place] [address], [place],  [address]          Radiology Studies: DG Chest 2 View  Result Date: 06/13/2021 CLINICAL DATA:  Chest pain EXAM: CHEST - 2 VIEW COMPARISON:  None. FINDINGS: Right neck catheter tip overlies the mid superior vena cava. Unchanged, enlarged cardiac silhouette. There are diffuse interstitial opacities. There are lower lung alveolar opacities. Trace effusions. No visible pneumothorax. No acute osseous abnormality. Left axillary stent. IMPRESSION: Mild pulmonary edema and trace bilateral pleural effusions. Cardiomegaly. Electronically Signed   By: Maurine Simmering M.D.   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 EXAM: CT OF THE LOWER LEFT EXTREMITY WITH CONTRAST TECHNIQUE: Multidetector CT imaging of the lower left extremity was performed according to the standard protocol following intravenous contrast administration. RADIATION DOSE REDUCTION: This exam was performed according to the departmental dose-optimization program which includes automated exposure control, adjustment of the mA and/or kV according to patient size and/or use of iterative reconstruction technique. CONTRAST:  133mL OMNIPAQUE IOHEXOL 350 MG/ML SOLN COMPARISON:  CT examination dated January 05, 2021 FINDINGS: Bones/Joint/Cartilage Status post below-knee  amputation. No fracture or dislocation. No cortical erosion or periosteal reaction. 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 Normal alignment. No joint effusion. Ligaments Ligaments are suboptimally evaluated by CT. Muscles and Tendons No intramuscular hematoma fluid collection. Soft tissue Mild skin thickening and subcutaneous soft tissue edema consistent with mild cellulitis. No drainable fluid collection or abscess. Prominent vascular calcifications. IMPRESSION: 1. Postsurgical changes for prior below-knee amputation without evidence of fracture or acute osseous abnormality. 2. No evidence of osteomyelitis. 3. Mild subcutaneous soft tissue swelling and edema without evidence of drainable fluid collection or abscess. 4. Prominent vascular calcifications. Electronically Signed   By: Keane Police D.O.   On: 06/14/2021 18:52   ECHOCARDIOGRAM COMPLETE  Result Date: 06/14/2021    ECHOCARDIOGRAM REPORT   Patient Name:   Daniel [name] Date of Exam: 06/14/2021 Medical Rec #:  [id number]  Height:       71.0 in Accession #:    [id number]         Weight:       273.6 lb Date of Birth:  Aug 31, 1960          BSA:          2.409 m Patient Age:    60 years           BP:           171/75 mmHg Patient Gender: M                  HR:           87 bpm. Exam Location:  Inpatient Procedure: 2D Echo Indications:    Chest pain  History:        Patient has prior history of Echocardiogram examinations, most                 recent 10/28/2020. CHF; Risk Factors:Hypertension.  Sonographer:    Jefferey Pica Referring Phys: [id number] [place]  1. Left ventricular ejection fraction, by estimation, is 55 to 60%. The left ventricle has normal function. The left ventricle has no regional wall motion abnormalities. The left ventricular internal cavity size was mildly dilated. There is mild left ventricular hypertrophy. Left ventricular diastolic parameters are indeterminate.  2. Right  ventricular systolic function is normal. The right ventricular size is normal. There is moderately elevated pulmonary artery systolic pressure. The estimated right ventricular systolic pressure is 07.6 mmHg.  3. Left atrial size was moderately dilated.  4. Right atrial size was mildly dilated.  5. The mitral valve is normal in structure. Trivial mitral valve regurgitation. No evidence of mitral stenosis.  6. The aortic valve is tricuspid. Aortic valve regurgitation is not visualized. Aortic valve sclerosis/calcification is present, without any evidence of aortic stenosis.  7. The inferior vena cava is dilated in size with <50% respiratory variability, suggesting right atrial pressure of 15 mmHg. FINDINGS  Left Ventricle: Left ventricular ejection fraction, by estimation, is 55 to 60%. The left ventricle has normal function. The left ventricle has no regional wall motion abnormalities. The left ventricular internal cavity size was mildly dilated. There is  mild left ventricular hypertrophy. Left ventricular diastolic parameters are indeterminate. Right Ventricle: The right ventricular size is normal. No increase in right ventricular wall thickness. Right ventricular systolic function is normal. There is moderately elevated pulmonary artery systolic pressure. The tricuspid regurgitant velocity is 2.89 m/s, and with an assumed right atrial pressure of 15 mmHg, the estimated right ventricular systolic pressure is 80.8 mmHg. Left Atrium: Left atrial size was moderately dilated. Right Atrium: Right atrial size was mildly dilated. Pericardium: Trivial pericardial effusion is present. Mitral Valve: The mitral valve is normal in structure. Trivial mitral valve regurgitation. No evidence of mitral valve stenosis. Tricuspid Valve: The tricuspid valve is normal in structure. Tricuspid valve regurgitation is trivial. Aortic Valve: The aortic valve is tricuspid. Aortic valve regurgitation is not visualized. Aortic valve  sclerosis/calcification is present, without any evidence of aortic stenosis. Aortic valve peak gradient measures 13.2 mmHg. Pulmonic Valve: The pulmonic valve was normal in structure. Pulmonic valve regurgitation is not visualized. Aorta: The aortic root is normal in size and structure. Venous: The inferior vena cava is dilated in size with less than 50% respiratory variability, suggesting right atrial pressure of 15 mmHg. IAS/Shunts: No atrial level shunt detected by color flow Doppler.  LEFT VENTRICLE PLAX 2D  LVIDd:         6.50 cm   Diastology LVIDs:         3.80 cm   LV e' medial:    7.58 cm/s LV PW:         1.20 cm   LV E/e' medial:  17.2 LV IVS:        1.20 cm   LV e' lateral:   10.70 cm/s LVOT diam:     2.00 cm   LV E/e' lateral: 12.1 LV SV:         102 LV SV Index:   42 LVOT Area:     3.14 cm  RIGHT VENTRICLE             IVC RV S prime:     16.10 cm/s  IVC diam: 2.40 cm TAPSE (M-mode): 3.2 cm LEFT ATRIUM              Index        RIGHT ATRIUM           Index LA diam:        4.50 cm  1.87 cm/m   RA Area:     18.40 cm LA Vol (A2C):   110.0 ml 45.66 ml/m  RA Volume:   51.00 ml  21.17 ml/m LA Vol (A4C):   95.8 ml  39.77 ml/m LA Biplane Vol: 107.0 ml 44.42 ml/m  AORTIC VALVE                  PULMONIC VALVE AV Area (Vmax): 2.87 cm      PV Vmax:       1.14 m/s AV Vmax:        182.00 cm/s   PV Peak grad:  5.2 mmHg AV Peak Grad:   13.2 mmHg LVOT Vmax:      166.00 cm/s LVOT Vmean:     109.000 cm/s LVOT VTI:       0.325 m  AORTA Ao Root diam: 3.20 cm Ao Asc diam:  3.60 cm MITRAL VALVE                TRICUSPID VALVE MV Area (PHT): 4.21 cm     TR Peak grad:   33.4 mmHg MV Decel Time: 180 msec     TR Vmax:        289.00 cm/s MV E velocity: 130.00 cm/s MV A velocity: 93.00 cm/s   SHUNTS MV E/A ratio:  1.40         Systemic VTI:  0.32 m                             Systemic Diam: 2.00 cm Dalton McleanMD Electronically signed by Franki Monte Signature Date/Time: 06/14/2021/4:19:14 PM    Final    VAS [place] LOWER  EXTREMITY VENOUS (DVT)  Result Date: 06/14/2021  Lower Venous DVT Study Patient Name:  Daniel [name]  Date of Exam:   06/14/2021 Medical Rec #: [id number]           Accession #:    [id number] Date of Birth: 05/18/1960           Patient Gender: M Patient Age:   28 years Exam Location:  [facility] Procedure:      VAS [place] LOWER EXTREMITY VENOUS (DVT) Referring Phys: Roxanne Mins PATEL --------------------------------------------------------------------------------  Indications: Pain, and Swelling.  Comparison Study: Previous exam 04/14/21 was negative for DVT Performing Technologist: Jeral Fruit  Hill RVT, RDMS  Examination Guidelines: A complete evaluation includes B-mode imaging, spectral Doppler, color Doppler, and power Doppler as needed of all accessible portions of each vessel. Bilateral testing is considered an integral part of a complete examination. Limited examinations for reoccurring indications may be performed as noted. The reflux portion of the exam is performed with the patient in reverse Trendelenburg.  +---------+---------------+---------+-----------+----------+-------------------+  RIGHT     Compressibility Phasicity Spontaneity Properties Thrombus Aging       +---------+---------------+---------+-----------+----------+-------------------+  CFV       Full            Yes       Yes                                         +---------+---------------+---------+-----------+----------+-------------------+  SFJ       Full                                                                  +---------+---------------+---------+-----------+----------+-------------------+  FV Prox   Full            Yes       Yes                                         +---------+---------------+---------+-----------+----------+-------------------+  FV Mid    Full            Yes       Yes                                         +---------+---------------+---------+-----------+----------+-------------------+  FV Distal Full             Yes       Yes                                         +---------+---------------+---------+-----------+----------+-------------------+  PFV       Full                                                                  +---------+---------------+---------+-----------+----------+-------------------+  POP       Full            Yes       Yes                                         +---------+---------------+---------+-----------+----------+-------------------+  PTV       Full                                                                  +---------+---------------+---------+-----------+----------+-------------------+  PERO                                                       Not well visualized  +---------+---------------+---------+-----------+----------+-------------------+   +----+---------------+---------+-----------+----------+--------------+  LEFT Compressibility Phasicity Spontaneity Properties Thrombus Aging  +----+---------------+---------+-----------+----------+--------------+  CFV  Full            Yes       Yes                                    +----+---------------+---------+-----------+----------+--------------+     Summary: RIGHT: - No evidence of deep vein thrombosis in the lower extremity. No indirect evidence of obstruction proximal to the inguinal ligament. - No evidence of common femoral vein obstruction. - No cystic structure found in the popliteal fossa.  LEFT: - No evidence of common femoral vein obstruction.  *See table(s) above for measurements and observations. Electronically signed by Orlie Pollen on 06/14/2021 at 6:34:47 PM.    Final         Scheduled Meds:  amLODipine  10 mg Oral QHS   calcium acetate  667 mg Oral TID WC   Chlorhexidine Gluconate Cloth  6 each Topical Q0600   cloNIDine  0.1 mg Oral Once per day on Mon Wed Fri   cloNIDine  0.1 mg Oral 2 times per day on Sun Tue Thu Sat   doxercalciferol  3 mcg Intravenous Q M,W,F-HD   ferrous sulfate  325 mg Oral q morning    gabapentin  100 mg Oral TID   heparin  5,000 Units Subcutaneous Q8H   multivitamin  1 tablet Oral QHS   nicotine  21 mg Transdermal Daily   pantoprazole  40 mg Oral BID   sodium chloride flush  3 mL Intravenous Q12H   Continuous Infusions:  sodium chloride     sodium chloride     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    Mauricia Mertens J [place] DO [facility] Available via Epic secure chat 7am-7pm After these hours, please refer to coverage provider listed on [email or web address] 06/15/2021, 10:19 AM

## 2021-06-15 NOTE — Progress Notes (Signed)
Patient ID: Daniel [name], male   DOB: July 21, 1960, 61 y.o.   MRN: [id number] Patient is currently in dialysis. Hemodialysis reports he is fine.   Haydee Salter, RN

## 2021-06-16 DIAGNOSIS — R079 Chest pain, unspecified: Secondary | ICD-10-CM | POA: Diagnosis not present

## 2021-06-16 DIAGNOSIS — R072 Precordial pain: Secondary | ICD-10-CM

## 2021-06-16 DIAGNOSIS — I5033 Acute on chronic diastolic (congestive) heart failure: Secondary | ICD-10-CM | POA: Diagnosis not present

## 2021-06-16 DIAGNOSIS — I10 Essential (primary) hypertension: Secondary | ICD-10-CM | POA: Diagnosis not present

## 2021-06-16 DIAGNOSIS — N186 End stage renal disease: Secondary | ICD-10-CM | POA: Diagnosis not present

## 2021-06-16 DIAGNOSIS — T8789 Other complications of amputation stump: Secondary | ICD-10-CM

## 2021-06-16 MED ORDER — CEFAZOLIN SODIUM-DEXTROSE 2-4 GM/100ML-% IV SOLN
2.0000 g | INTRAVENOUS | Status: AC
  Administered 2021-06-17: 2 g via INTRAVENOUS
  Filled 2021-06-16: qty 100

## 2021-06-16 NOTE — Progress Notes (Signed)
PROGRESS NOTE    Daniel [name]  JJK:[id number] DOB: 09-25-1960 DOA: 06/13/2021 PCP: [name], NP    Brief Narrative:  Daniel [name] is a 61 y.o. male with past medical history significant for ESRD on HD MWF, PAD s/p left BKA (04/16/2021), anemia of chronic kidney disease, HTN, ICH in 2019 who presented to [facility] [place] ED on 2/16 from vascular surgery outpatient office for evaluation of chest pain.  Patient reports 3 days of intermittent left-sided chest pain described as a squeezing/aching sensation with radiation to his back.  Onset at rest and continues to occur at rest with associated shortness of breath and pleuritic chest discomfort.  Notes that his dyspnea is worse when he is lying flat and reports swelling to his right leg which is chronic.  Denies diaphoresis, no nausea/vomiting, no abdominal pain.  Denies any similar symptoms in the past.  Continues to endorse tobacco use, 0.5-1 pack/day but denies any alcohol or illicit drug use.  In the ED, BP 150/79, HR 84, RR 18, temperature 98.4 F, SPO2 90% on room air.  WBC 5.3, hemoglobin 7.3, platelets 179, sodium 139, potassium 3.4, bicarb 28, BUN 18, creatinine 0.86, glucose 96, high sensitive troponin 17>18.  Chest x-ray with cardiomegaly, mild pulmonary edema and trace bilateral pleural effusions.  Nephrology consulted for dialysis while inpatient with recommendation of 1 unit PRBC during HD.  TRH consulted for further evaluation and management of acute onset chest pain.    Assessment & Plan:   Assessment and Plan: * Chest pain- (present on admission) Patient presenting with 3-day history of intermittent atypical chest pain localized to his left chest with intermittent radiation towards his back.  Patient reports squeezing/pressure-like in nature at times followed by sharp in nature.  Not improved with nitroglycerin tabs.  High sensitive troponin without significant elevation, 17 and 18 on repeat.  EKG with NSR, no concerning  dynamic changes.  Chest x-ray with mild pulmonary edema.  Etiology likely secondary to pulmonary edema versus anemia.  Patient was being worked up outpatient by vascular surgery for cardiac clearance for upcoming revision of his stump wound.  Etiology of his chest pain likely secondary to volume overload with pulmonary edema coupled with likely musculoskeletal pain with muscle spasm as chest discomfort improved with muscle relaxant. Patient with 3 days intermittent atypical chest pain.  Troponin without significant elevation, 17 and 18 on repeat.  EKG without acute ischemic changes.  Has pulmonary edema and peripheral edema suggestive of underlying CHF which is likely contributing to presenting symptoms.  Also may be related to worsening anemia.  Right lower extremity vascular duplex ultrasound negative for DVT.  TTE with LVEF 55-60%, LV mildly dilated, biatrial enlargement, IVC dilated.  Transfuse 1 unit PRBC on 2/17. --Cardiology following, appreciate assistance --Continue aggressive volume management with HD --Robaxin as needed --Continue to monitor on telemetry  Acute on chronic diastolic CHF (congestive heart failure) ([place])- (present on admission) Patient presenting with pain associated with shortness of breath.  BNP elevated 521.8 with chest x-ray findings of mild pulmonary edema. --Continue HD for volume management --Renal diet with 1200 mL fluid restriction --Strict I's and O's and daily weights  PAD (peripheral artery disease) ([name])- (present on admission) S/p left BKA 04/16/2021 by vascular surgery, Dr. Scot Dock, for nonhealing left foot wound.  Patient having continued pain at stump and vascular surgery considering revision pending further chest pain work-up.  CT tib/fib left with no osteomyelitis, no fluid collection or abscess --Vascular surgery, plan stump revision on  2/20 --NPO after MN  Anemia due to chronic kidney disease- (present on admission) Hemoglobin 7.3 on admission,  baseline usually 8-9.  No obvious bleeding.  FOBT is negative.  Transfusing 1 unit PRBC with HD per nephrology.  Anemia panel with iron 259, TIBC 357, ferritin 125; consistent with anemia of chronic medical disease. --Hgb 7.3>8.2 --Continue home ferrous sulfate 325 mg p.o. daily  Essential hypertension- (present on admission) --Clonidine 0.1mg  PO daily on MWF, BID on T/T/S/S --Amlodipine 10 mg p.o. daily.   --Hydralazine 10mg  IV q6h PRN SBP >180 or DBP >110.  End-stage renal disease on hemodialysis [facility]) --Nephrology following for HD needs  Tobacco use- (present on admission) Reports ongoing tobacco use, smoking 0.5-1 pack/day.  Smoking cessation advised.   --Nicotine patch  GERD (gastroesophageal reflux disease)- (present on admission) --Protonix 40 mg p.o. twice daily     DVT prophylaxis: heparin injection 5,000 Units Start: 06/13/21 2200    Code Status: Full Code Family Communication: No family present at bedside this morning  Disposition Plan:  Level of care: Telemetry Cardiac Status is: Observation The patient remains OBS appropriate and will d/c before 2 midnights.    Consultants:  Cardiology Vascular surgery Nephrology  Procedures:  TTE: Pending  Antimicrobials:  None   Subjective: Patient seen examined bedside, lying in bed eating breakfast.  Remains chest pain-free this morning.  Vascular plans for stump revision tomorrow.  No other questions or concerns at this time.  Denies headache, no vision changes, no chest pain, no palpitations, no shortness of breath, no abdominal pain, no weakness, no fatigue, no fever/chills/night sweats, no nausea/vomiting/diarrhea, no paresthesias.  No acute events overnight per nursing staff.  Objective: Vitals:   06/15/21 1058 06/15/21 2032 06/16/21 0355 06/16/21 0754  BP:  (!) 156/79 (!) 149/76 (!) 141/84  Pulse:  87 71 79  Resp:  (!) 21 11 19   Temp:  98.2 F (36.8 C) 98 F (36.7 C) 97.7 F (36.5 C)  TempSrc:  Oral  Oral Oral  SpO2:  96% 94% 94%  Weight: 117.9 kg  118.6 kg   Height:        Intake/Output Summary (Last 24 hours) at 06/16/2021 0865 Last data filed at 06/16/2021 7846 Gross per 24 hour  Intake 720 ml  Output 3000 ml  Net -2280 ml   Filed Weights   06/15/21 0706 06/15/21 1058 06/16/21 0355  Weight: 120.3 kg 117.9 kg 118.6 kg    Examination:  Physical Exam: GEN: NAD, alert and oriented x 3, chronically ill appearance, appears older than stated age HEENT: NCAT, PERRL, EOMI, sclera clear, MMM PULM: Mild crackles noted bilateral bases, no wheezing, normal respiratory effort without accessory muscle use, on room air with SPO2 96% at rest CV: RRR w/o M/G/R GI: abd soft, NTND, NABS, no R/G/M MSK: 1+ peripheral edema right lower extremity, left lower extremity BKA noted with Ace wrap in place, moves all extremities independently with strength globally intact.  NEURO: CN II-XII intact, no focal deficits, sensation to light touch intact PSYCH: normal mood/affect Integumentary: Right stump wound noted with ace wrap in place, otherwise no concerning lesions/rashes/wounds     Data Reviewed: I have personally reviewed following labs and imaging studies  CBC: Recent Labs  Lab 06/13/21 1105 06/14/21 0356 06/15/21 0339  WBC 5.3 4.4 5.4  NEUTROABS 2.4  --   --   HGB 7.3* 7.1* 8.2*  HCT 23.4* 22.7* 24.5*  MCV 107.8* 108.6* 101.7*  PLT 179 172 962   Basic Metabolic Panel:  Recent Labs  Lab 06/13/21 1105 06/14/21 0356 06/15/21 0339  NA 139 138 133*  K 3.4* 3.4* 3.4*  CL 97* 97* 95*  CO2 28 27 25   GLUCOSE 96 126* 93  BUN 18 23 17   CREATININE 7.86* 8.86* 6.01*  CALCIUM 9.4 9.6 9.2  PHOS  --   --  3.2   GFR: Estimated Creatinine Clearance: 16.9 mL/min (A) (by C-G formula based on SCr of 6.01 mg/dL (H)). Liver Function Tests: Recent Labs  Lab 06/13/21 1105 06/15/21 0339  AST 39  --   ALT 12  --   ALKPHOS 67  --   BILITOT 0.5  --   PROT 8.4*  --   ALBUMIN 2.5* 2.3*    No results for input(s): LIPASE, AMYLASE in the last 168 hours. No results for input(s): AMMONIA in the last 168 hours. Coagulation Profile: No results for input(s): INR, PROTIME in the last 168 hours. Cardiac Enzymes: No results for input(s): CKTOTAL, CKMB, CKMBINDEX, TROPONINI in the last 168 hours. BNP (last 3 results) No results for input(s): PROBNP in the last 8760 hours. HbA1C: No results for input(s): HGBA1C in the last 72 hours. CBG: Recent Labs  Lab 06/13/21 2046  GLUCAP 123*   Lipid Profile: No results for input(s): CHOL, HDL, LDLCALC, TRIG, CHOLHDL, LDLDIRECT in the last 72 hours. Thyroid Function Tests: No results for input(s): TSH, T4TOTAL, FREET4, T3FREE, THYROIDAB in the last 72 hours. Anemia Panel: Recent Labs    06/14/21 1139  FERRITIN 125  TIBC 357  IRON 259*   Sepsis Labs: No results for input(s): PROCALCITON, LATICACIDVEN in the last 168 hours.  Recent Results (from the past 240 hour(s))  Resp Panel by RT-PCR (Flu A&B, Covid) Nasopharyngeal Swab     Status: None   Collection Time: 06/13/21  6:40 PM   Specimen: Nasopharyngeal Swab; Nasopharyngeal(NP) swabs in vial transport medium  Result Value Ref Range Status   SARS Coronavirus 2 by RT PCR NEGATIVE NEGATIVE Final    Comment: (NOTE) SARS-CoV-2 target nucleic acids are NOT DETECTED.  The SARS-CoV-2 RNA is generally detectable in upper respiratory specimens during the acute phase of infection. The lowest concentration of SARS-CoV-2 viral copies this assay can detect is 138 copies/mL. A negative result does not preclude SARS-Cov-2 infection and should not be used as the sole basis for treatment or other patient management decisions. A negative result may occur with  improper specimen collection/handling, submission of specimen other than nasopharyngeal swab, presence of viral mutation(s) within the areas targeted by this assay, and inadequate number of viral copies(<138 copies/mL). A negative  result must be combined with clinical observations, patient history, and epidemiological information. The expected result is Negative.  Fact Sheet for Patients:  EntrepreneurPulse.com.au  Fact Sheet for Healthcare Providers:  IncredibleEmployment.be  This test is no t yet approved or cleared by the [place] FDA and  has been authorized for detection and/or diagnosis of SARS-CoV-2 by FDA under an Emergency Use Authorization (EUA). This EUA will remain  in effect (meaning this test can be used) for the duration of the COVID-19 declaration under Section 564(b)(1) of the Act, 21 U.S.C.section 360bbb-3(b)(1), unless the authorization is terminated  or revoked sooner.       Influenza A by PCR NEGATIVE NEGATIVE Final   Influenza B by PCR NEGATIVE NEGATIVE Final    Comment: (NOTE) The Xpert Xpress SARS-CoV-2/FLU/RSV plus assay is intended as an aid in the diagnosis of influenza from Nasopharyngeal swab specimens and should not be used  as a sole basis for treatment. Nasal washings and aspirates are unacceptable for Xpert Xpress SARS-CoV-2/FLU/RSV testing.  Fact Sheet for Patients: EntrepreneurPulse.com.au  Fact Sheet for Healthcare Providers: IncredibleEmployment.be  This test is not yet approved or cleared by the [place] FDA and has been authorized for detection and/or diagnosis of SARS-CoV-2 by FDA under an Emergency Use Authorization (EUA). This EUA will remain in effect (meaning this test can be used) for the duration of the COVID-19 declaration under Section 564(b)(1) of the Act, 21 U.S.C. section 360bbb-3(b)(1), unless the authorization is terminated or revoked.  Performed at [facility], [place] [address], [place], [place] [address]   MRSA Next Gen by PCR, Nasal     Status: None   Collection Time: 06/14/21  4:12 AM   Specimen: Nasal Mucosa; Nasal Swab  Result Value Ref Range Status    MRSA by PCR Next Gen NOT DETECTED NOT DETECTED Final    Comment: (NOTE) The GeneXpert MRSA Assay (FDA approved for NASAL specimens only), is one component of a comprehensive MRSA colonization surveillance program. It is not intended to diagnose MRSA infection nor to guide or monitor treatment for MRSA infections. Test performance is not FDA approved in patients less than 85 years old. Performed at [facility], [place] [address], [place], [place] [address]          Radiology Studies: CT TIBIA FIBULA LEFT W CONTRAST  Result Date: 06/14/2021 CLINICAL DATA:  Lower leg trauma, status post left BKA 12/20 with pain. EXAM: CT OF THE LOWER LEFT EXTREMITY WITH CONTRAST TECHNIQUE: Multidetector CT imaging of the lower left extremity was performed according to the standard protocol following intravenous contrast administration. RADIATION DOSE REDUCTION: This exam was performed according to the departmental dose-optimization program which includes automated exposure control, adjustment of the mA and/or kV according to patient size and/or use of iterative reconstruction technique. CONTRAST:  179mL OMNIPAQUE IOHEXOL 350 MG/ML SOLN COMPARISON:  CT examination dated January 05, 2021 FINDINGS: Bones/Joint/Cartilage Status post below-knee amputation. No fracture or dislocation. No cortical erosion or periosteal reaction. 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 Normal alignment. No joint effusion. Ligaments Ligaments are suboptimally evaluated by CT. Muscles and Tendons No intramuscular hematoma fluid collection. Soft tissue Mild skin thickening and subcutaneous soft tissue edema consistent with mild cellulitis. No drainable fluid collection or abscess. Prominent vascular calcifications. IMPRESSION: 1. Postsurgical changes for prior below-knee amputation without evidence of fracture or acute osseous abnormality. 2. No evidence of osteomyelitis. 3. Mild subcutaneous soft tissue  swelling and edema without evidence of drainable fluid collection or abscess. 4. Prominent vascular calcifications. Electronically Signed   By: Keane Police D.O.   On: 06/14/2021 18:52   ECHOCARDIOGRAM COMPLETE  Result Date: 06/14/2021    ECHOCARDIOGRAM REPORT   Patient Name:   KALONJI ZURAWSKI Date of Exam: 06/14/2021 Medical Rec #:  [id number]          Height:       71.0 in Accession #:    [id number]         Weight:       273.6 lb Date of Birth:  02/03/1961          BSA:          2.409 m Patient Age:    28 years           BP:           171/75 mmHg Patient Gender: M  HR:           87 bpm. Exam Location:  Inpatient Procedure: 2D Echo Indications:    Chest pain  History:        Patient has prior history of Echocardiogram examinations, most                 recent 10/28/2020. CHF; Risk Factors:Hypertension.  Sonographer:    Jefferey Pica Referring Phys: [id number] [place]  1. Left ventricular ejection fraction, by estimation, is 55 to 60%. The left ventricle has normal function. The left ventricle has no regional wall motion abnormalities. The left ventricular internal cavity size was mildly dilated. There is mild left ventricular hypertrophy. Left ventricular diastolic parameters are indeterminate.  2. Right ventricular systolic function is normal. The right ventricular size is normal. There is moderately elevated pulmonary artery systolic pressure. The estimated right ventricular systolic pressure is 01.6 mmHg.  3. Left atrial size was moderately dilated.  4. Right atrial size was mildly dilated.  5. The mitral valve is normal in structure. Trivial mitral valve regurgitation. No evidence of mitral stenosis.  6. The aortic valve is tricuspid. Aortic valve regurgitation is not visualized. Aortic valve sclerosis/calcification is present, without any evidence of aortic stenosis.  7. The inferior vena cava is dilated in size with <50% respiratory variability, suggesting right atrial  pressure of 15 mmHg. FINDINGS  Left Ventricle: Left ventricular ejection fraction, by estimation, is 55 to 60%. The left ventricle has normal function. The left ventricle has no regional wall motion abnormalities. The left ventricular internal cavity size was mildly dilated. There is  mild left ventricular hypertrophy. Left ventricular diastolic parameters are indeterminate. Right Ventricle: The right ventricular size is normal. No increase in right ventricular wall thickness. Right ventricular systolic function is normal. There is moderately elevated pulmonary artery systolic pressure. The tricuspid regurgitant velocity is 2.89 m/s, and with an assumed right atrial pressure of 15 mmHg, the estimated right ventricular systolic pressure is 01.0 mmHg. Left Atrium: Left atrial size was moderately dilated. Right Atrium: Right atrial size was mildly dilated. Pericardium: Trivial pericardial effusion is present. Mitral Valve: The mitral valve is normal in structure. Trivial mitral valve regurgitation. No evidence of mitral valve stenosis. Tricuspid Valve: The tricuspid valve is normal in structure. Tricuspid valve regurgitation is trivial. Aortic Valve: The aortic valve is tricuspid. Aortic valve regurgitation is not visualized. Aortic valve sclerosis/calcification is present, without any evidence of aortic stenosis. Aortic valve peak gradient measures 13.2 mmHg. Pulmonic Valve: The pulmonic valve was normal in structure. Pulmonic valve regurgitation is not visualized. Aorta: The aortic root is normal in size and structure. Venous: The inferior vena cava is dilated in size with less than 50% respiratory variability, suggesting right atrial pressure of 15 mmHg. IAS/Shunts: No atrial level shunt detected by color flow Doppler.  LEFT VENTRICLE PLAX 2D LVIDd:         6.50 cm   Diastology LVIDs:         3.80 cm   LV e' medial:    7.58 cm/s LV PW:         1.20 cm   LV E/e' medial:  17.2 LV IVS:        1.20 cm   LV e' lateral:    10.70 cm/s LVOT diam:     2.00 cm   LV E/e' lateral: 12.1 LV SV:         102 LV SV Index:   42 LVOT Area:  3.14 cm  RIGHT VENTRICLE             IVC RV S prime:     16.10 cm/s  IVC diam: 2.40 cm TAPSE (M-mode): 3.2 cm LEFT ATRIUM              Index        RIGHT ATRIUM           Index LA diam:        4.50 cm  1.87 cm/m   RA Area:     18.40 cm LA Vol (A2C):   110.0 ml 45.66 ml/m  RA Volume:   51.00 ml  21.17 ml/m LA Vol (A4C):   95.8 ml  39.77 ml/m LA Biplane Vol: 107.0 ml 44.42 ml/m  AORTIC VALVE                  PULMONIC VALVE AV Area (Vmax): 2.87 cm      PV Vmax:       1.14 m/s AV Vmax:        182.00 cm/s   PV Peak grad:  5.2 mmHg AV Peak Grad:   13.2 mmHg LVOT Vmax:      166.00 cm/s LVOT Vmean:     109.000 cm/s LVOT VTI:       0.325 m  AORTA Ao Root diam: 3.20 cm Ao Asc diam:  3.60 cm MITRAL VALVE                TRICUSPID VALVE MV Area (PHT): 4.21 cm     TR Peak grad:   33.4 mmHg MV Decel Time: 180 msec     TR Vmax:        289.00 cm/s MV E velocity: 130.00 cm/s MV A velocity: 93.00 cm/s   SHUNTS MV E/A ratio:  1.40         Systemic VTI:  0.32 m                             Systemic Diam: 2.00 cm Dalton McleanMD Electronically signed by Franki Monte Signature Date/Time: 06/14/2021/4:19:14 PM    Final    VAS [place] LOWER EXTREMITY VENOUS (DVT)  Result Date: 06/14/2021  Lower Venous DVT Study Patient Name:  Daniel [name]  Date of Exam:   06/14/2021 Medical Rec #: [id number]           Accession #:    [id number] Date of Birth: 08/16/1960           Patient Gender: M Patient Age:   45 years Exam Location:  [facility] Procedure:      VAS [place] LOWER EXTREMITY VENOUS (DVT) Referring Phys: Roxanne Mins PATEL --------------------------------------------------------------------------------  Indications: Pain, and Swelling.  Comparison Study: Previous exam 04/14/21 was negative for DVT Performing Technologist: Rogelia Rohrer RVT, RDMS  Examination Guidelines: A complete evaluation includes B-mode imaging, spectral  Doppler, color Doppler, and power Doppler as needed of all accessible portions of each vessel. Bilateral testing is considered an integral part of a complete examination. Limited examinations for reoccurring indications may be performed as noted. The reflux portion of the exam is performed with the patient in reverse Trendelenburg.  +---------+---------------+---------+-----------+----------+-------------------+  RIGHT     Compressibility Phasicity Spontaneity Properties Thrombus Aging       +---------+---------------+---------+-----------+----------+-------------------+  CFV       Full            Yes  Yes                                         +---------+---------------+---------+-----------+----------+-------------------+  SFJ       Full                                                                  +---------+---------------+---------+-----------+----------+-------------------+  FV Prox   Full            Yes       Yes                                         +---------+---------------+---------+-----------+----------+-------------------+  FV Mid    Full            Yes       Yes                                         +---------+---------------+---------+-----------+----------+-------------------+  FV Distal Full            Yes       Yes                                         +---------+---------------+---------+-----------+----------+-------------------+  PFV       Full                                                                  +---------+---------------+---------+-----------+----------+-------------------+  POP       Full            Yes       Yes                                         +---------+---------------+---------+-----------+----------+-------------------+  PTV       Full                                                                  +---------+---------------+---------+-----------+----------+-------------------+  PERO                                                       Not well  visualized  +---------+---------------+---------+-----------+----------+-------------------+   +----+---------------+---------+-----------+----------+--------------+  LEFT  Compressibility Phasicity Spontaneity Properties Thrombus Aging  +----+---------------+---------+-----------+----------+--------------+  CFV  Full            Yes       Yes                                    +----+---------------+---------+-----------+----------+--------------+     Summary: RIGHT: - No evidence of deep vein thrombosis in the lower extremity. No indirect evidence of obstruction proximal to the inguinal ligament. - No evidence of common femoral vein obstruction. - No cystic structure found in the popliteal fossa.  LEFT: - No evidence of common femoral vein obstruction.  *See table(s) above for measurements and observations. Electronically signed by Orlie Pollen on 06/14/2021 at 6:34:47 PM.    Final         Scheduled Meds:  amLODipine  10 mg Oral QHS   calcium acetate  667 mg Oral TID WC   Chlorhexidine Gluconate Cloth  6 each Topical Q0600   cloNIDine  0.1 mg Oral Once per day on Mon Wed Fri   cloNIDine  0.1 mg Oral 2 times per day on Sun Tue Thu Sat   doxercalciferol  3 mcg Intravenous Q M,W,F-HD   ferrous sulfate  325 mg Oral q morning   gabapentin  100 mg Oral TID   heparin  5,000 Units Subcutaneous Q8H   multivitamin  1 tablet Oral QHS   nicotine  21 mg Transdermal Daily   pantoprazole  40 mg Oral BID   sodium chloride flush  3 mL Intravenous Q12H   Continuous Infusions:  [START ON 06/17/2021]  ceFAZolin (ANCEF) IV     sodium thiosulfate infusion for calciphylaxis Stopped (06/15/21 1204)     LOS: 2 days    Time spent: 49 minutes spent on chart review, discussion with nursing staff, consultants, updating family and interview/physical exam; more than 50% of that time was spent in counseling and/or coordination of care.    Drevion Offord J [place] DO [facility] Available via Epic secure chat  7am-7pm After these hours, please refer to coverage provider listed on [email or web address] 06/16/2021, 9:58 AM

## 2021-06-16 NOTE — Progress Notes (Signed)
°  Daily Progress Note   Subjective: Doing well this morning. No complaints.  Objective: Vitals:   06/16/21 0355 06/16/21 0754  BP: (!) 149/76 (!) 141/84  Pulse: 71 79  Resp: 11 19  Temp: 98 F (36.7 C) 97.7 F (36.5 C)  SpO2: 94% 94%    Physical Examination Less erythema at left BKA stump  ASSESSMENT/PLAN:  Daniel [name] is a 61 year old male who presents with poor wound healing of previous to the left below-knee amputation.  He would benefit from wound debridement as there appears to be localized infection.  CT demonstrated no concern for abscess, no bony involvement.  After discussing the risks and benefits of BKA revision with Daniel [name], he elected to proceed.  Please make n.p.o. midnight.   Cassandria Santee MD [facility] [phone] 06/16/2021  9:47 AM

## 2021-06-16 NOTE — Progress Notes (Signed)
[facility] Progress Note    Assessment/ Plan:   # ESRD:  -outpatient HD orders: [facility]. MWF. 4hrs, Nipro, 450/500, EDW 125 kg, 2K, 2 Cal.  No heparin.  Mircera 225 mcg every 2 weeks (last dose: 2/10), Hectorol 12mcg qtx -next HD tomorrow on sched. Can be moved to Tues if needed depending on OR tomorrow   # Chest pain -troponins not significantly elevated -possible related to underlying CHF and/or anemia -chest pain resolved -per pmd  # PAD -VVS following, planning for stump revision on Monday 2/20   # Volume/ hypertension: EDW 125kg.  UF as tolerated. -Resume home antihypertensives   # Symptomatic anemia, anemia of Chronic Kidney Disease, macrocytosis: Hemoglobin 7.3 on admission . Anemia ahs been an ongoing issue. Currently receiving max dose esa as an outpatient. FOBT neg. R/O bleed per pmd. Iron replete on panel -s/p 1u prbc w/ HD 2/17, hgb up to 8.2 today -not due for ESA yet   # Secondary Hyperparathyroidism/Hyperphosphatemia: check phos, on nathiosulfate   # Vascular access: active access- TDC   Gean Quint, MD [facility]  Subjective:   No complaints/acute events overnight. Tolerated HD yesterday, net uf 3L. Remains chest pain free currently   Objective:   BP (!) 149/76 (BP Location: Right Arm)    Pulse 71    Temp 98 F (36.7 C) (Oral)    Resp 11    Ht 5\' 11"  (1.803 m)    Wt 118.6 kg    SpO2 94%    BMI 36.47 kg/m   Intake/Output Summary (Last 24 hours) at 06/16/2021 0736 Last data filed at 06/16/2021 0300 Gross per 24 hour  Intake 600 ml  Output 3000 ml  Net -2400 ml   Weight change: -3.8 kg  Physical Exam: Gen:nad CVS:rrr Resp:normal wob JOA:CZYS AYT:KZSW bka, trace RLE edema Neuro: awake, alert Dialysis access: [facility]  Imaging: CT TIBIA FIBULA LEFT W CONTRAST  Result Date: 06/14/2021 CLINICAL DATA:  Lower leg trauma, status post left BKA 12/20 with pain. EXAM: CT OF THE LOWER LEFT EXTREMITY WITH CONTRAST TECHNIQUE:  Multidetector CT imaging of the lower left extremity was performed according to the standard protocol following intravenous contrast administration. RADIATION DOSE REDUCTION: This exam was performed according to the departmental dose-optimization program which includes automated exposure control, adjustment of the mA and/or kV according to patient size and/or use of iterative reconstruction technique. CONTRAST:  128mL OMNIPAQUE IOHEXOL 350 MG/ML SOLN COMPARISON:  CT examination dated January 05, 2021 FINDINGS: Bones/Joint/Cartilage Status post below-knee amputation. No fracture or dislocation. No cortical erosion or periosteal reaction. 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 Normal alignment. No joint effusion. Ligaments Ligaments are suboptimally evaluated by CT. Muscles and Tendons No intramuscular hematoma fluid collection. Soft tissue Mild skin thickening and subcutaneous soft tissue edema consistent with mild cellulitis. No drainable fluid collection or abscess. Prominent vascular calcifications. IMPRESSION: 1. Postsurgical changes for prior below-knee amputation without evidence of fracture or acute osseous abnormality. 2. No evidence of osteomyelitis. 3. Mild subcutaneous soft tissue swelling and edema without evidence of drainable fluid collection or abscess. 4. Prominent vascular calcifications. Electronically Signed   By: Keane Police D.O.   On: 06/14/2021 18:52   ECHOCARDIOGRAM COMPLETE  Result Date: 06/14/2021    ECHOCARDIOGRAM REPORT   Patient Name:   Daniel [name] Date of Exam: 06/14/2021 Medical Rec #:  [id number]          Height:       71.0 in  Accession #:    [id number]         Weight:       273.6 lb Date of Birth:  Dec 14, 1960          BSA:          2.409 m Patient Age:    61 years           BP:           171/75 mmHg Patient Gender: M                  HR:           87 bpm. Exam Location:  Inpatient Procedure: 2D Echo Indications:    Chest pain  History:         Patient has prior history of Echocardiogram examinations, most                 recent 10/28/2020. CHF; Risk Factors:Hypertension.  Sonographer:    Jefferey Pica Referring Phys: [id number] [place]  1. Left ventricular ejection fraction, by estimation, is 55 to 60%. The left ventricle has normal function. The left ventricle has no regional wall motion abnormalities. The left ventricular internal cavity size was mildly dilated. There is mild left ventricular hypertrophy. Left ventricular diastolic parameters are indeterminate.  2. Right ventricular systolic function is normal. The right ventricular size is normal. There is moderately elevated pulmonary artery systolic pressure. The estimated right ventricular systolic pressure is 55.9 mmHg.  3. Left atrial size was moderately dilated.  4. Right atrial size was mildly dilated.  5. The mitral valve is normal in structure. Trivial mitral valve regurgitation. No evidence of mitral stenosis.  6. The aortic valve is tricuspid. Aortic valve regurgitation is not visualized. Aortic valve sclerosis/calcification is present, without any evidence of aortic stenosis.  7. The inferior vena cava is dilated in size with <50% respiratory variability, suggesting right atrial pressure of 15 mmHg. FINDINGS  Left Ventricle: Left ventricular ejection fraction, by estimation, is 55 to 60%. The left ventricle has normal function. The left ventricle has no regional wall motion abnormalities. The left ventricular internal cavity size was mildly dilated. There is  mild left ventricular hypertrophy. Left ventricular diastolic parameters are indeterminate. Right Ventricle: The right ventricular size is normal. No increase in right ventricular wall thickness. Right ventricular systolic function is normal. There is moderately elevated pulmonary artery systolic pressure. The tricuspid regurgitant velocity is 2.89 m/s, and with an assumed right atrial pressure of 15 mmHg, the  estimated right ventricular systolic pressure is 74.1 mmHg. Left Atrium: Left atrial size was moderately dilated. Right Atrium: Right atrial size was mildly dilated. Pericardium: Trivial pericardial effusion is present. Mitral Valve: The mitral valve is normal in structure. Trivial mitral valve regurgitation. No evidence of mitral valve stenosis. Tricuspid Valve: The tricuspid valve is normal in structure. Tricuspid valve regurgitation is trivial. Aortic Valve: The aortic valve is tricuspid. Aortic valve regurgitation is not visualized. Aortic valve sclerosis/calcification is present, without any evidence of aortic stenosis. Aortic valve peak gradient measures 13.2 mmHg. Pulmonic Valve: The pulmonic valve was normal in structure. Pulmonic valve regurgitation is not visualized. Aorta: The aortic root is normal in size and structure. Venous: The inferior vena cava is dilated in size with less than 50% respiratory variability, suggesting right atrial pressure of 15 mmHg. IAS/Shunts: No atrial level shunt detected by color flow Doppler.  LEFT VENTRICLE PLAX 2D LVIDd:  6.50 cm   Diastology LVIDs:         3.80 cm   LV e' medial:    7.58 cm/s LV PW:         1.20 cm   LV E/e' medial:  17.2 LV IVS:        1.20 cm   LV e' lateral:   10.70 cm/s LVOT diam:     2.00 cm   LV E/e' lateral: 12.1 LV SV:         102 LV SV Index:   42 LVOT Area:     3.14 cm  RIGHT VENTRICLE             IVC RV S prime:     16.10 cm/s  IVC diam: 2.40 cm TAPSE (M-mode): 3.2 cm LEFT ATRIUM              Index        RIGHT ATRIUM           Index LA diam:        4.50 cm  1.87 cm/m   RA Area:     18.40 cm LA Vol (A2C):   110.0 ml 45.66 ml/m  RA Volume:   51.00 ml  21.17 ml/m LA Vol (A4C):   95.8 ml  39.77 ml/m LA Biplane Vol: 107.0 ml 44.42 ml/m  AORTIC VALVE                  PULMONIC VALVE AV Area (Vmax): 2.87 cm      PV Vmax:       1.14 m/s AV Vmax:        182.00 cm/s   PV Peak grad:  5.2 mmHg AV Peak Grad:   13.2 mmHg LVOT Vmax:      166.00  cm/s LVOT Vmean:     109.000 cm/s LVOT VTI:       0.325 m  AORTA Ao Root diam: 3.20 cm Ao Asc diam:  3.60 cm MITRAL VALVE                TRICUSPID VALVE MV Area (PHT): 4.21 cm     TR Peak grad:   33.4 mmHg MV Decel Time: 180 msec     TR Vmax:        289.00 cm/s MV E velocity: 130.00 cm/s MV A velocity: 93.00 cm/s   SHUNTS MV E/A ratio:  1.40         Systemic VTI:  0.32 m                             Systemic Diam: 2.00 cm Dalton McleanMD Electronically signed by Franki Monte Signature Date/Time: 06/14/2021/4:19:14 PM    Final    VAS [place] LOWER EXTREMITY VENOUS (DVT)  Result Date: 06/14/2021  Lower Venous DVT Study Patient Name:  AASIR DAIGLER  Date of Exam:   06/14/2021 Medical Rec #: [id number]           Accession #:    [id number] Date of Birth: 25-Oct-1960           Patient Gender: M Patient Age:   52 years Exam [facility]  [facility] Procedure:      VAS [place] LOWER EXTREMITY VENOUS (DVT) Referring Phys: Roxanne Mins PATEL --------------------------------------------------------------------------------  Indications: Pain, and Swelling.  Comparison Study: Previous exam 04/14/21 was negative for DVT Performing Technologist: Rogelia Rohrer RVT, RDMS  Examination Guidelines: A complete evaluation  includes B-mode imaging, spectral Doppler, color Doppler, and power Doppler as needed of all accessible portions of each vessel. Bilateral testing is considered an integral part of a complete examination. Limited examinations for reoccurring indications may be performed as noted. The reflux portion of the exam is performed with the patient in reverse Trendelenburg.  +---------+---------------+---------+-----------+----------+-------------------+  RIGHT     Compressibility Phasicity Spontaneity Properties Thrombus Aging       +---------+---------------+---------+-----------+----------+-------------------+  CFV       Full            Yes       Yes                                          +---------+---------------+---------+-----------+----------+-------------------+  SFJ       Full                                                                  +---------+---------------+---------+-----------+----------+-------------------+  FV Prox   Full            Yes       Yes                                         +---------+---------------+---------+-----------+----------+-------------------+  FV Mid    Full            Yes       Yes                                         +---------+---------------+---------+-----------+----------+-------------------+  FV Distal Full            Yes       Yes                                         +---------+---------------+---------+-----------+----------+-------------------+  PFV       Full                                                                  +---------+---------------+---------+-----------+----------+-------------------+  POP       Full            Yes       Yes                                         +---------+---------------+---------+-----------+----------+-------------------+  PTV       Full                                                                  +---------+---------------+---------+-----------+----------+-------------------+  PERO                                                       Not well visualized  +---------+---------------+---------+-----------+----------+-------------------+   +----+---------------+---------+-----------+----------+--------------+  LEFT Compressibility Phasicity Spontaneity Properties Thrombus Aging  +----+---------------+---------+-----------+----------+--------------+  CFV  Full            Yes       Yes                                    +----+---------------+---------+-----------+----------+--------------+     Summary: RIGHT: - No evidence of deep vein thrombosis in the lower extremity. No indirect evidence of obstruction proximal to the inguinal ligament. - No evidence of common femoral vein obstruction. - No cystic  structure found in the popliteal fossa.  LEFT: - No evidence of common femoral vein obstruction.  *See table(s) above for measurements and observations. Electronically signed by Orlie Pollen on 06/14/2021 at 6:34:47 PM.    Final     Labs: BMET Recent Labs  Lab 06/13/21 1105 06/14/21 0356 06/15/21 0339  NA 139 138 133*  K 3.4* 3.4* 3.4*  CL 97* 97* 95*  CO2 28 27 25   GLUCOSE 96 126* 93  BUN 18 23 17   CREATININE 7.86* 8.86* 6.01*  CALCIUM 9.4 9.6 9.2  PHOS  --   --  3.2   CBC Recent Labs  Lab 06/13/21 1105 06/14/21 0356 06/15/21 0339  WBC 5.3 4.4 5.4  NEUTROABS 2.4  --   --   HGB 7.3* 7.1* 8.2*  HCT 23.4* 22.7* 24.5*  MCV 107.8* 108.6* 101.7*  PLT 179 172 171    Medications:     amLODipine  10 mg Oral QHS   calcium acetate  667 mg Oral TID WC   Chlorhexidine Gluconate Cloth  6 each Topical Q0600   cloNIDine  0.1 mg Oral Once per day on Mon Wed Fri   cloNIDine  0.1 mg Oral 2 times per day on Sun Tue Thu Sat   doxercalciferol  3 mcg Intravenous Q M,W,F-HD   ferrous sulfate  325 mg Oral q morning   gabapentin  100 mg Oral TID   heparin  5,000 Units Subcutaneous Q8H   multivitamin  1 tablet Oral QHS   nicotine  21 mg Transdermal Daily   pantoprazole  40 mg Oral BID   sodium chloride flush  3 mL Intravenous Q12H      Gean Quint, MD [facility] 06/16/2021, 7:36 AM

## 2021-06-17 ENCOUNTER — Telehealth: Payer: Self-pay | Admitting: Internal Medicine

## 2021-06-17 ENCOUNTER — Encounter (HOSPITAL_COMMUNITY): Payer: Self-pay | Admitting: Internal Medicine

## 2021-06-17 ENCOUNTER — Encounter (HOSPITAL_COMMUNITY)
Admission: EM | Disposition: A | Discharge status: Skilled Nursing Facility | Payer: Self-pay | Source: Skilled Nursing Facility | Attending: Internal Medicine

## 2021-06-17 ENCOUNTER — Inpatient Hospital Stay (HOSPITAL_COMMUNITY): Payer: Medicare Other | Admitting: Anesthesiology

## 2021-06-17 DIAGNOSIS — I10 Essential (primary) hypertension: Secondary | ICD-10-CM | POA: Diagnosis not present

## 2021-06-17 DIAGNOSIS — I5033 Acute on chronic diastolic (congestive) heart failure: Secondary | ICD-10-CM | POA: Diagnosis not present

## 2021-06-17 DIAGNOSIS — E1122 Type 2 diabetes mellitus with diabetic chronic kidney disease: Secondary | ICD-10-CM

## 2021-06-17 DIAGNOSIS — N186 End stage renal disease: Secondary | ICD-10-CM

## 2021-06-17 DIAGNOSIS — R079 Chest pain, unspecified: Secondary | ICD-10-CM | POA: Diagnosis not present

## 2021-06-17 DIAGNOSIS — I120 Hypertensive chronic kidney disease with stage 5 chronic kidney disease or end stage renal disease: Secondary | ICD-10-CM

## 2021-06-17 DIAGNOSIS — T8789 Other complications of amputation stump: Secondary | ICD-10-CM

## 2021-06-17 DIAGNOSIS — T8781 Dehiscence of amputation stump: Secondary | ICD-10-CM

## 2021-06-17 HISTORY — PX: AMPUTATION: SHX166

## 2021-06-17 HISTORY — PX: APPLICATION OF WOUND VAC: SHX5189

## 2021-06-17 LAB — GLUCOSE, CAPILLARY
Glucose-Capillary: 88 mg/dL (ref 70–99)
Glucose-Capillary: 101 mg/dL — ABNORMAL HIGH (ref 70–99)

## 2021-06-17 LAB — BASIC METABOLIC PANEL
Anion gap: 13 (ref 5–15)
BUN: 22 mg/dL (ref 8–23)
CO2: 26 mmol/L (ref 22–32)
Calcium: 10 mg/dL (ref 8.9–10.3)
Chloride: 96 mmol/L — ABNORMAL LOW (ref 98–111)
Creatinine, Ser: 6.5 mg/dL — ABNORMAL HIGH (ref 0.61–1.24)
GFR, Estimated: 9 mL/min — ABNORMAL LOW (ref >60)
Glucose, Bld: 98 mg/dL (ref 70–99)
Potassium: 3.3 mmol/L — ABNORMAL LOW (ref 3.5–5.1)
Sodium: 135 mmol/L (ref 135–145)

## 2021-06-17 LAB — CBC
HCT: 24.8 % — ABNORMAL LOW (ref 39.0–52.0)
Hemoglobin: 7.9 g/dL — ABNORMAL LOW (ref 13.0–17.0)
MCH: 32.8 pg (ref 26.0–34.0)
MCHC: 31.9 g/dL (ref 30.0–36.0)
MCV: 102.9 fL — ABNORMAL HIGH (ref 80.0–100.0)
Platelets: 158 10*3/uL (ref 150–400)
RBC: 2.41 MIL/uL — ABNORMAL LOW (ref 4.22–5.81)
RDW: 18 % — ABNORMAL HIGH (ref 11.5–15.5)
WBC: 4.8 10*3/uL (ref 4.0–10.5)
nRBC: 0 % (ref 0.0–0.2)

## 2021-06-17 LAB — TYPE AND SCREEN
ABO/RH(D): B POS
Antibody Screen: NEGATIVE

## 2021-06-17 SURGERY — AMPUTATION BELOW KNEE
Anesthesia: General | Site: Leg Lower | Laterality: Left

## 2021-06-17 MED ORDER — MIDAZOLAM HCL 2 MG/2ML IJ SOLN
INTRAMUSCULAR | Status: AC
  Filled 2021-06-17: qty 2

## 2021-06-17 MED ORDER — SODIUM CHLORIDE 0.9 % IV SOLN: INTRAVENOUS | Status: DC

## 2021-06-17 MED ORDER — CHLORHEXIDINE GLUCONATE 0.12 % MT SOLN
OROMUCOSAL | Status: AC
  Filled 2021-06-17: qty 15

## 2021-06-17 MED ORDER — FENTANYL CITRATE (PF) 100 MCG/2ML IJ SOLN
INTRAMUSCULAR | Status: AC
Start: 2021-06-17 — End: 2021-06-18
  Filled 2021-06-17: qty 4

## 2021-06-17 MED ORDER — OXYCODONE HCL 5 MG/5ML PO SOLN: 5.0000 mg | Freq: Once | ORAL | Status: AC | PRN

## 2021-06-17 MED ORDER — LIDOCAINE 2% (20 MG/ML) 5 ML SYRINGE
INTRAMUSCULAR | Status: DC | PRN
  Administered 2021-06-17: 60 mg via INTRAVENOUS

## 2021-06-17 MED ORDER — PROPOFOL 10 MG/ML IV BOLUS
INTRAVENOUS | Status: DC | PRN
  Administered 2021-06-17: 70 mg via INTRAVENOUS
  Administered 2021-06-17: 120 mg via INTRAVENOUS

## 2021-06-17 MED ORDER — 0.9 % SODIUM CHLORIDE (POUR BTL) OPTIME
TOPICAL | Status: DC | PRN
  Administered 2021-06-17: 1000 mL

## 2021-06-17 MED ORDER — OXYCODONE HCL 5 MG PO TABS
ORAL_TABLET | ORAL | Status: AC
  Filled 2021-06-17: qty 1

## 2021-06-17 MED ORDER — ORAL CARE MOUTH RINSE: 15.0000 mL | Freq: Once | OROMUCOSAL | Status: DC

## 2021-06-17 MED ORDER — CHLORHEXIDINE GLUCONATE 0.12 % MT SOLN: 15.0000 mL | Freq: Once | OROMUCOSAL | Status: DC

## 2021-06-17 MED ORDER — CHLORHEXIDINE GLUCONATE 0.12 % MT SOLN: 15.0000 mL | Freq: Once | OROMUCOSAL | Status: AC

## 2021-06-17 MED ORDER — CEFAZOLIN SODIUM-DEXTROSE 2-4 GM/100ML-% IV SOLN
INTRAVENOUS | Status: AC
  Filled 2021-06-17: qty 100

## 2021-06-17 MED ORDER — LACTATED RINGERS IV SOLN: INTRAVENOUS | Status: DC

## 2021-06-17 MED ORDER — ONDANSETRON HCL 4 MG/2ML IJ SOLN
INTRAMUSCULAR | Status: DC | PRN
  Administered 2021-06-17: 4 mg via INTRAVENOUS

## 2021-06-17 MED ORDER — ACETAMINOPHEN 500 MG PO TABS
ORAL_TABLET | ORAL | Status: AC
Start: 2021-06-17 — End: 2021-06-17
  Administered 2021-06-17: 1000 mg via ORAL
  Filled 2021-06-17: qty 2

## 2021-06-17 MED ORDER — HEPARIN SODIUM (PORCINE) 1000 UNIT/ML IJ SOLN
INTRAMUSCULAR | Status: AC
  Administered 2021-06-17: 1000 [IU]
  Filled 2021-06-17: qty 4

## 2021-06-17 MED ORDER — CHLORHEXIDINE GLUCONATE 0.12 % MT SOLN
OROMUCOSAL | Status: AC
  Administered 2021-06-17: 15 mL via OROMUCOSAL
  Filled 2021-06-17: qty 15

## 2021-06-17 MED ORDER — FENTANYL CITRATE (PF) 250 MCG/5ML IJ SOLN
INTRAMUSCULAR | Status: DC | PRN
  Administered 2021-06-17 (×2): 25 ug via INTRAVENOUS
  Administered 2021-06-17 (×2): 50 ug via INTRAVENOUS
  Administered 2021-06-17 (×2): 25 ug via INTRAVENOUS
  Administered 2021-06-17: 50 ug via INTRAVENOUS

## 2021-06-17 MED ORDER — OXYCODONE HCL 5 MG PO TABS
5.0000 mg | ORAL_TABLET | Freq: Once | ORAL | Status: AC | PRN
  Administered 2021-06-17: 5 mg via ORAL

## 2021-06-17 MED ORDER — MIDAZOLAM HCL 2 MG/2ML IJ SOLN
INTRAMUSCULAR | Status: DC | PRN
  Administered 2021-06-17: 2 mg via INTRAVENOUS

## 2021-06-17 MED ORDER — ACETAMINOPHEN 500 MG PO TABS
ORAL_TABLET | ORAL | Status: AC
  Filled 2021-06-17: qty 2

## 2021-06-17 MED ORDER — ACETAMINOPHEN 500 MG PO TABS: 1000.0000 mg | ORAL_TABLET | Freq: Once | ORAL | Status: AC

## 2021-06-17 MED ORDER — FENTANYL CITRATE (PF) 250 MCG/5ML IJ SOLN
INTRAMUSCULAR | Status: AC
  Filled 2021-06-17: qty 5

## 2021-06-17 MED ORDER — PHENYLEPHRINE HCL-NACL 20-0.9 MG/250ML-% IV SOLN
INTRAVENOUS | Status: DC | PRN
  Administered 2021-06-17: 50 ug/min via INTRAVENOUS

## 2021-06-17 MED ORDER — PHENYLEPHRINE 40 MCG/ML (10ML) SYRINGE FOR IV PUSH (FOR BLOOD PRESSURE SUPPORT)
PREFILLED_SYRINGE | INTRAVENOUS | Status: DC | PRN
  Administered 2021-06-17 (×3): 120 ug via INTRAVENOUS

## 2021-06-17 MED ORDER — PROPOFOL 10 MG/ML IV BOLUS
INTRAVENOUS | Status: AC
  Filled 2021-06-17: qty 20

## 2021-06-17 MED ORDER — FENTANYL CITRATE (PF) 100 MCG/2ML IJ SOLN
25.0000 ug | INTRAMUSCULAR | Status: DC | PRN
  Administered 2021-06-17 (×3): 25 ug via INTRAVENOUS
  Administered 2021-06-17: 50 ug via INTRAVENOUS

## 2021-06-17 MED ORDER — ORAL CARE MOUTH RINSE: 15.0000 mL | Freq: Once | OROMUCOSAL | Status: AC

## 2021-06-17 MED ORDER — FENTANYL CITRATE (PF) 100 MCG/2ML IJ SOLN
INTRAMUSCULAR | Status: AC
  Filled 2021-06-17: qty 2

## 2021-06-17 SURGICAL SUPPLY — 59 items
BAG COUNTER SPONGE SURGICOUNT (BAG) ×2 IMPLANT
BAG SPNG CNTER NS LX DISP (BAG) ×1
BANDAGE ESMARK 6X9 LF (GAUZE/BANDAGES/DRESSINGS) IMPLANT
BLADE SAW SAG 73X25 THK (BLADE) ×1
BLADE SAW SGTL 73X25 THK (BLADE) ×1 IMPLANT
BNDG CMPR 9X6 STRL LF SNTH (GAUZE/BANDAGES/DRESSINGS)
BNDG COHESIVE 6X5 TAN STRL LF (GAUZE/BANDAGES/DRESSINGS) ×2 IMPLANT
BNDG ELASTIC 4X5.8 VLCR STR LF (GAUZE/BANDAGES/DRESSINGS) ×2 IMPLANT
BNDG ELASTIC 6X5.8 VLCR STR LF (GAUZE/BANDAGES/DRESSINGS) ×2 IMPLANT
BNDG ESMARK 6X9 LF (GAUZE/BANDAGES/DRESSINGS)
BNDG GAUZE ELAST 4 BULKY (GAUZE/BANDAGES/DRESSINGS) ×4 IMPLANT
CANISTER SUCT 3000ML PPV (MISCELLANEOUS) ×2 IMPLANT
CANISTER WOUNDNEG PRESSURE 500 (CANNISTER) ×1 IMPLANT
CLIP LIGATING EXTRA MED SLVR (CLIP) ×2 IMPLANT
CLIP LIGATING EXTRA SM BLUE (MISCELLANEOUS) ×2 IMPLANT
CLIP TI LARGE 6 (CLIP) IMPLANT
CLIP VESOCCLUDE MED 6/CT (CLIP) IMPLANT
COVER BACK TABLE 60X90IN (DRAPES) IMPLANT
COVER SURGICAL LIGHT HANDLE (MISCELLANEOUS) ×2 IMPLANT
DRAIN CHANNEL 19F RND (DRAIN) IMPLANT
DRAPE HALF SHEET 40X57 (DRAPES) ×2 IMPLANT
DRAPE INCISE 23X17 IOBAN STRL (DRAPES) ×1
DRAPE INCISE 23X17 STRL (DRAPES) ×1 IMPLANT
DRAPE INCISE IOBAN 23X17 STRL (DRAPES) ×1 IMPLANT
DRAPE INCISE IOBAN 66X45 STRL (DRAPES) ×2 IMPLANT
DRAPE ORTHO SPLIT 77X108 STRL (DRAPES) ×4
DRAPE SURG ORHT 6 SPLT 77X108 (DRAPES) ×2 IMPLANT
DRESSING VERAFLO CLEANS CC MED (GAUZE/BANDAGES/DRESSINGS) IMPLANT
DRSG ADAPTIC 3X8 NADH LF (GAUZE/BANDAGES/DRESSINGS) ×2 IMPLANT
DRSG VERAFLO CLEANSE CC MED (GAUZE/BANDAGES/DRESSINGS) ×2
ELECT REM PT RETURN 9FT ADLT (ELECTROSURGICAL) ×2
ELECTRODE REM PT RTRN 9FT ADLT (ELECTROSURGICAL) ×1 IMPLANT
EVACUATOR SILICONE 100CC (DRAIN) IMPLANT
GAUZE SPONGE 4X4 12PLY STRL (GAUZE/BANDAGES/DRESSINGS) ×4 IMPLANT
GLOVE SRG 8 PF TXTR STRL LF DI (GLOVE) ×2 IMPLANT
GLOVE SURG POLYISO LF SZ8 (GLOVE) IMPLANT
GLOVE SURG UNDER POLY LF SZ8 (GLOVE) ×4
GOWN STRL REUS W/ TWL LRG LVL3 (GOWN DISPOSABLE) ×2 IMPLANT
GOWN STRL REUS W/TWL 2XL LVL3 (GOWN DISPOSABLE) ×2 IMPLANT
GOWN STRL REUS W/TWL LRG LVL3 (GOWN DISPOSABLE) ×4
KIT BASIN OR (CUSTOM PROCEDURE TRAY) ×2 IMPLANT
KIT TURNOVER KIT B (KITS) ×2 IMPLANT
NS IRRIG 1000ML POUR BTL (IV SOLUTION) ×2 IMPLANT
PACK GENERAL/GYN (CUSTOM PROCEDURE TRAY) ×2 IMPLANT
PAD ARMBOARD 7.5X6 YLW CONV (MISCELLANEOUS) ×4 IMPLANT
PAD NEG PRESSURE SENSATRAC (MISCELLANEOUS) ×1 IMPLANT
STAPLER VISISTAT 35W (STAPLE) ×2 IMPLANT
STOCKINETTE IMPERVIOUS LG (DRAPES) ×2 IMPLANT
SUT ETHILON 3 0 PS 1 (SUTURE) IMPLANT
SUT SILK 2 0 (SUTURE) ×2
SUT SILK 2 0 SH CR/8 (SUTURE) ×1 IMPLANT
SUT SILK 2-0 18XBRD TIE 12 (SUTURE) ×1 IMPLANT
SUT SILK 3 0 (SUTURE) ×2
SUT SILK 3-0 18XBRD TIE 12 (SUTURE) ×1 IMPLANT
SUT VIC AB 2-0 CT1 18 (SUTURE) ×2 IMPLANT
SUT VIC AB 3-0 SH 18 (SUTURE) IMPLANT
TOWEL GREEN STERILE (TOWEL DISPOSABLE) ×4 IMPLANT
UNDERPAD 30X36 HEAVY ABSORB (UNDERPADS AND DIAPERS) ×2 IMPLANT
WATER STERILE IRR 1000ML POUR (IV SOLUTION) ×2 IMPLANT

## 2021-06-17 NOTE — Progress Notes (Signed)
Progress Note  Patient Name: Daniel [name] Date of Encounter: 06/17/2021  [facility] Cardiologist:Dr. Croitoru  Subjective   Having chest heaviness today that goes across his chest.  Also having back and shoulder pain.   Inpatient Medications    Scheduled Meds:  amLODipine  10 mg Oral QHS   calcium acetate  667 mg Oral TID WC   Chlorhexidine Gluconate Cloth  6 each Topical Q0600   cloNIDine  0.1 mg Oral Once per day on Mon Wed Fri   cloNIDine  0.1 mg Oral 2 times per day on Sun Tue Thu Sat   doxercalciferol  3 mcg Intravenous Q M,W,F-HD   ferrous sulfate  325 mg Oral q morning   gabapentin  100 mg Oral TID   heparin  5,000 Units Subcutaneous Q8H   heparin sodium (porcine)       multivitamin  1 tablet Oral QHS   nicotine  21 mg Transdermal Daily   pantoprazole  40 mg Oral BID   sodium chloride flush  3 mL Intravenous Q12H   Continuous Infusions:   ceFAZolin (ANCEF) IV     sodium thiosulfate infusion for calciphylaxis Stopped (06/15/21 1204)   PRN Meds: acetaminophen **OR** acetaminophen, hydrALAZINE, hydrOXYzine, methocarbamol, oxyCODONE-acetaminophen, senna-docusate   Vital Signs    Vitals:   06/16/21 2019 06/17/21 0311 06/17/21 0805 06/17/21 0808  BP: (!) 164/75 (!) 156/83  (!) 150/79  Pulse: 80 77  76  Resp: 17 16  18   Temp: 97.9 F (36.6 C) 97.8 F (36.6 C)  98.3 F (36.8 C)  TempSrc: Oral Oral  Oral  SpO2: 94% 96%  95%  Weight:  120 kg 119.2 kg   Height:        Intake/Output Summary (Last 24 hours) at 06/17/2021 0849 Last data filed at 06/16/2021 2020 Gross per 24 hour  Intake 120 ml  Output 300 ml  Net -180 ml   Last 3 Weights 06/17/2021 06/17/2021 06/16/2021  Weight (lbs) 262 lb 12.6 oz 264 lb 8.8 oz 261 lb 7.5 oz  Weight (kg) 119.2 kg 120 kg 118.6 kg      Telemetry    NSR - Personally Reviewed  ECG    No new EKG to review - Personally Reviewed  Physical Exam   GEN: No acute distress.   Neck: No JVD Cardiac: RRR, no murmurs,  rubs, or gallops.  Respiratory: Clear to auscultation bilaterally. GI: Soft, nontender, non-distended  MS: No edema; No deformity. Neuro:  Nonfocal  Psych: Normal affect   Labs    High Sensitivity Troponin:   Recent Labs  Lab 06/13/21 1105 06/13/21 1310 06/14/21 0641 06/14/21 1139 06/14/21 1451  TROPONINIHS 17 18* 16 14 14       Chemistry Recent Labs  Lab 06/13/21 1105 06/14/21 0356 06/15/21 0339 06/17/21 0358  NA 139 138 133* 135  K 3.4* 3.4* 3.4* 3.3*  CL 97* 97* 95* 96*  CO2 28 27 25 26   GLUCOSE 96 126* 93 98  BUN 18 23 17 22   CREATININE 7.86* 8.86* 6.01* 6.50*  CALCIUM 9.4 9.6 9.2 10.0  PROT 8.4*  --   --   --   ALBUMIN 2.5*  --  2.3*  --   AST 39  --   --   --   ALT 12  --   --   --   ALKPHOS 67  --   --   --   BILITOT 0.5  --   --   --  GFRNONAA 7* 6* 10* 9*  ANIONGAP 14 14 13 13      Hematology Recent Labs  Lab 06/14/21 0356 06/15/21 0339 06/17/21 0358  WBC 4.4 5.4 4.8  RBC 2.09* 2.41* 2.41*  HGB 7.1* 8.2* 7.9*  HCT 22.7* 24.5* 24.8*  MCV 108.6* 101.7* 102.9*  MCH 34.0 34.0 32.8  MCHC 31.3 33.5 31.9  RDW 16.4* 19.9* 18.0*  PLT 172 171 158    BNP Recent Labs  Lab 06/14/21 0356  BNP 521.8*     DDimer No results for input(s): DDIMER in the last 168 hours.   Radiology    No results found.  Cardiac Studies   2D echo 06/14/2021 IMPRESSIONS     1. Left ventricular ejection fraction, by estimation, is 55 to 60%. The  left ventricle has normal function. The left ventricle has no regional  wall motion abnormalities. The left ventricular internal cavity size was  mildly dilated. There is mild left  ventricular hypertrophy. Left ventricular diastolic parameters are  indeterminate.   2. Right ventricular systolic function is normal. The right ventricular  size is normal. There is moderately elevated pulmonary artery systolic  pressure. The estimated right ventricular systolic pressure is 29.4 mmHg.   3. Left atrial size was moderately  dilated.   4. Right atrial size was mildly dilated.   5. The mitral valve is normal in structure. Trivial mitral valve  regurgitation. No evidence of mitral stenosis.   6. The aortic valve is tricuspid. Aortic valve regurgitation is not  visualized. Aortic valve sclerosis/calcification is present, without any  evidence of aortic stenosis.   7. The inferior vena cava is dilated in size with <50% respiratory  variability, suggesting right atrial pressure of 15 mmHg.   Patient Profile     61 y.o. 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 [place]  Assessment & Plan    Chest Pain  -Patient presents complaining of 4 days of chest pain.  Pain is sharp, left-sided, constant.  Worse with coughing, deep breaths, laying on back.  Associated with occasional shortness of breath. Given SL nitro in ED, did not relieve pain.  - HSTN 17>>18>>16>>14 - EKG without signs of ischemia  - With low and flat troponins, atypical description of chest pain, this is not ACS. Likely that patient has some underlying CAD - having more CP this am but also back and shoulder pain and suspect this may be related to both MSK etiology from laying in bed as well as volume overload and severe anemia -he has multiple risk factors for CAD and has hx of PAD but enzymes and EKG are normal and echo was normal.   - LE venous dopplers were normal - Do not think that current CP is cardiac - will set up for outpt followup with Dr. Sallyanne Kuster with outpt ischemic workup   Acute on chronic diastolic heart failure -Echo in 2019 showed EF 55 to 60%, moderate LVH, grade 2 diastolic dysfunction -Most recent echo in 10/2020 showed EF 60 to 65%, Moderate LVH, indeterminate diastolic parameters -BNP elevated to 521.8 this  admission  - Per chart review, patient appeared fluid overloaded on initial exam.  Chest x-ray showed mild pulmonary edema, trace bilateral pleural effusions - Echocardiogram after dialysis continued to show signs of fluid  overload. Continued to have RLE edema.  - continue HD per nephrology to manage volume removal   Anemia secondary to chronic kidney disease -Hemoglobin 7.3 on admission, baseline usually 8-9 -Transfused 1 unit packed red blood cells during dialysis -Has history of GI bleed, fecal occult negative this admission   Otherwise managed per primary  - ESRD - HTN  -Secondary Hyperparathyroidism/Hyperphosphatemia: [facility] will sign off.   Other recommendations (labs, testing, etc):  will set up outpt coronary CTA Follow up as an outpatient:  Followup with Dr. Sallyanne Kuster in 1-2 weeks      For questions or updates, please contact [facility] Please consult [email or web address] for contact info under        Signed, Fransico Him, MD  06/17/2021, 8:49 AM

## 2021-06-17 NOTE — Transfer of Care (Signed)
Immediate Anesthesia Transfer of Care Note  Patient: Daniel [name]  Procedure(s) Performed: REVISION AMPUTATION BELOW KNEE (Left: Knee)  Patient Location: PACU  Anesthesia Type:General  Level of Consciousness: awake  Airway & Oxygen Therapy: Patient Spontanous Breathing and Patient connected to face mask oxygen  Post-op Assessment: Report given to RN and Post -op Vital signs reviewed and stable  Post vital signs: Reviewed and stable  Last Vitals:  Vitals Value Taken Time  BP 149/61 06/17/21 1446  Temp    Pulse 80 06/17/21 1448  Resp 14 06/17/21 1448  SpO2 99 % 06/17/21 1448  Vitals shown include unvalidated device data.  Last Pain:  Vitals:   06/17/21 1316  TempSrc: Oral  PainSc:          Complications: No notable events documented.

## 2021-06-17 NOTE — Anesthesia Preprocedure Evaluation (Addendum)
Anesthesia Evaluation  Patient identified by MRN, date of birth, ID band Patient awake    Reviewed: Allergy & Precautions, NPO status , Patient's Chart, lab work & pertinent test results  History of Anesthesia Complications Negative for: history of anesthetic complications  Airway Mallampati: II  TM Distance: >3 FB Neck ROM: Full    Dental  (+) Dental Advisory Given, Missing   Pulmonary Current Smoker and Patient abstained from smoking.,    Pulmonary exam normal        Cardiovascular hypertension, Pt. on medications + Peripheral Vascular Disease  Normal cardiovascular exam   '23 TTE - EF 55 to 60%. The left ventricular internal cavity size was mildly dilated. There is mild left ventricular hypertrophy. There is moderately elevated pulmonary artery systolic pressure. The estimated right ventricular systolic pressure is 62.9 mmHg. Left atrial size was moderately dilated. Right atrial size was mildly dilated. Trivial mitral valve regurgitation.     Neuro/Psych CVA, No Residual Symptoms negative psych ROS   GI/Hepatic PUD, GERD  Medicated and Controlled,(+)     substance abuse  alcohol use and cocaine use, Hepatitis -  Endo/Other  diabetes, Type 2 Obesity   Renal/GU Dialysis and ESRFRenal disease     Musculoskeletal negative musculoskeletal ROS (+)   Abdominal   Peds  Hematology  (+) Blood dyscrasia, anemia ,  Plt 147k    Anesthesia Other Findings   Reproductive/Obstetrics                            Anesthesia Physical  Anesthesia Plan  ASA: 3  Anesthesia Plan: General   Post-op Pain Management: Tylenol PO (pre-op)*   Induction: Intravenous  PONV Risk Score and Plan: 2 and Treatment may vary due to age or medical condition, Ondansetron, Dexamethasone and Midazolam  Airway Management Planned: LMA  Additional Equipment: None  Intra-op Plan:   Post-operative Plan: Extubation  in OR  Informed Consent: I have reviewed the patients History and Physical, chart, labs and discussed the procedure including the risks, benefits and alternatives for the proposed anesthesia with the patient or authorized representative who has indicated his/her understanding and acceptance.     Dental advisory given  Plan Discussed with: CRNA and Anesthesiologist  Anesthesia Plan Comments:        Anesthesia Quick Evaluation

## 2021-06-17 NOTE — Op Note (Signed)
° ° °  NAME: Daniel [name]    MRN: [id number] DOB: [birth year]    DATE OF OPERATION: 06/17/2021  PREOP DIAGNOSIS:    Left below-knee amputation wound  POSTOP DIAGNOSIS:    Same  PROCEDURE:    Left below-knee amputation washout and debridement VAC placement Total wound size 11 cm x 2 cm x 1 cm  SURGEON: Broadus John  ASSIST: Paulo Fruit  ANESTHESIA: General   EBL: 4ml  INDICATIONS:    Daniel [name] is a 61 y.o. male Daniel [name] is a 61 year old male who presents with poor wound healing of previous to the left below-knee amputation.  He would benefit from wound debridement as there appears to be localized infection.  CT demonstrated no concern for abscess, no bony involvement.  After discussing the risks and benefits of BKA revision with Daniel [name], he elected to proceed.  FINDINGS:   Devitalized superficial tissue limited to the dermis and subcutaneous fat.    TECHNIQUE:   Patient was brought to the OR laid in the supine position.  General anesthesia was induced and the patient was prepped and draped in standard fashion.  A timeout was performed.  The case began with sharp debridement of the eschar on the lateral aspect of the left below-knee amputation.  This was carried into the subcutaneous fat.  Devitalized dermis as well as subcutaneous fat was appreciated.  There was no fascial involvement, no muscle involvement.  No abscess, no drainage. There were no specimen sent to microbiology.  The area was copiously irrigated with saline and VAC sponge placed along the 11cm x 2 cm x 1 cm defect.  Given the complexity of the case a first assistant was necessary in order to expedient the procedure and safely perform the technical aspects of the operation.  Recommend 7-day course antibiotics.  The [facility] can be changed my office in 5 days should the patient be discharged.  Will continue to follow.  Daniel Burows, MD [facility]  DATE OF  DICTATION:   06/17/2021

## 2021-06-17 NOTE — Anesthesia Postprocedure Evaluation (Signed)
Anesthesia Post Note  Patient: Daniel [name]  Procedure(s) Performed: REVISION AMPUTATION BELOW KNEE (Left: Leg Lower) APPLICATION OF WOUND VAC (Left: Leg Lower)     Patient location during evaluation: PACU Anesthesia Type: General Level of consciousness: awake and alert Pain management: pain level controlled Vital Signs Assessment: post-procedure vital signs reviewed and stable Respiratory status: spontaneous breathing, nonlabored ventilation, respiratory function stable and patient connected to nasal cannula oxygen Cardiovascular status: blood pressure returned to baseline and stable Postop Assessment: no apparent nausea or vomiting Anesthetic complications: no   No notable events documented.  Last Vitals:  Vitals:   06/17/21 1515 06/17/21 1530  BP: (!) 135/58 122/76  Pulse: 76 82  Resp: 11 14  Temp:  36.7 C  SpO2: 95% 97%    Last Pain:  Vitals:   06/17/21 1530  TempSrc:   PainSc: [place]

## 2021-06-17 NOTE — Telephone Encounter (Signed)
TOC Patient   Please call Patient- Patient have an appointment with Dr Harrington Challenger on 06-26-21

## 2021-06-17 NOTE — Progress Notes (Signed)
[facility] Progress Note    Assessment/ Plan:   # PAD -VVS following, planning for stump revision today 2/20  # ESRD:  -outpatient HD orders: [facility]. MWF. 4hrs, 450/500, EDW 125 kg, 2K, 2 Cal.  No heparin.  Mircera 225 mcg every 2 weeks (last dose: 2/10), Hectorol 77mcg qtx - HD today on schedule   # Chest pain - on admission -troponins not significantly elevated -possible related to underlying CHF and/or anemia -chest pain resolved -per pmd   # Volume/ hypertension: EDW 125kg.  UF as tolerated. Under dry wt.  - resume home antihypertensives   # Symptomatic anemia, anemia of Chronic Kidney Disease, macrocytosis: Hemoglobin 7.3 on admission . Anemia qhs been an ongoing issue. Currently receiving max dose esa as an outpatient. FOBT neg. R/O bleed per pmd. Iron replete on panel -s/p 1u prbc w/ HD 2/17, hgb better -not due for ESA yet   # Secondary Hyperparathyroidism/Hyperphosphatemia: check phos, on nathiosulfate   # Vascular access: active access- [facility]   Kelly Splinter, MD 06/17/2021, 11:31 AM      Subjective:   Seen  in HD, no c/o today   Objective:   BP 139/62    Pulse 85    Temp 98.3 F (36.8 C) (Oral)    Resp (!) 21    Ht 5\' 11"  (1.803 m)    Wt 119.2 kg    SpO2 95%    BMI 36.65 kg/m   Intake/Output Summary (Last 24 hours) at 06/17/2021 1129 Last data filed at 06/16/2021 2020 Gross per 24 hour  Intake 120 ml  Output 300 ml  Net -180 ml    Weight change: -0.3 kg  Physical Exam: Gen:nad CVS:rrr Resp:normal wob GHW:EXHB ZJI:RCVE bka, trace RLE edema Neuro: awake, alert Dialysis access: RIJ TDC  Imaging: No results found.  Labs: BMET Recent Labs  Lab 06/13/21 1105 06/14/21 0356 06/15/21 0339 06/17/21 0358  NA 139 138 133* 135  K 3.4* 3.4* 3.4* 3.3*  CL 97* 97* 95* 96*  CO2 28 27 25 26   GLUCOSE 96 126* 93 98  BUN 18 23 17 22   CREATININE 7.86* 8.86* 6.01* 6.50*  CALCIUM 9.4 9.6 9.2 10.0  PHOS  --   --  3.2  --     CBC Recent  Labs  Lab 06/13/21 1105 06/14/21 0356 06/15/21 0339 06/17/21 0358  WBC 5.3 4.4 5.4 4.8  NEUTROABS 2.4  --   --   --   HGB 7.3* 7.1* 8.2* 7.9*  HCT 23.4* 22.7* 24.5* 24.8*  MCV 107.8* 108.6* 101.7* 102.9*  PLT 179 172 171 158     Medications:     amLODipine  10 mg Oral QHS   calcium acetate  667 mg Oral TID WC   Chlorhexidine Gluconate Cloth  6 each Topical Q0600   cloNIDine  0.1 mg Oral Once per day on Mon Wed Fri   cloNIDine  0.1 mg Oral 2 times per day on Sun Tue Thu Sat   doxercalciferol  3 mcg Intravenous Q M,W,F-HD   ferrous sulfate  325 mg Oral q morning   gabapentin  100 mg Oral TID   heparin  5,000 Units Subcutaneous Q8H   heparin sodium (porcine)       multivitamin  1 tablet Oral QHS   nicotine  21 mg Transdermal Daily   pantoprazole  40 mg Oral BID   sodium chloride flush  3 mL Intravenous Q12H      Gean Quint, MD [employer]  06/17/2021, 11:29 AM

## 2021-06-17 NOTE — Anesthesia Procedure Notes (Signed)
Procedure Name: LMA Insertion Date/Time: 06/17/2021 2:10 PM Performed by: Griffin Dakin, CRNA Pre-anesthesia Checklist: Patient identified, Emergency Drugs available, Suction available, Patient being monitored and Timeout performed Patient Re-evaluated:Patient Re-evaluated prior to induction Oxygen Delivery Method: Circle system utilized Preoxygenation: Pre-oxygenation with 100% oxygen Induction Type: IV induction Ventilation: Mask ventilation without difficulty LMA: LMA inserted LMA Size: 5.0 Placement Confirmation: positive ETCO2 and breath sounds checked- equal and bilateral Tube secured with: Tape Dental Injury: Teeth and Oropharynx as per pre-operative assessment

## 2021-06-17 NOTE — Progress Notes (Addendum)
PROGRESS NOTE    Daniel [name]  YOV:[id number] DOB: [birth year] DOA: 06/13/2021 PCP: [name], NP    Brief Narrative:  Daniel [name] is a 61 y.o. male with past medical history significant for ESRD on HD MWF, PAD s/p left BKA (04/16/2021), anemia of chronic kidney disease, HTN, ICH in 2019 who presented to [facility] ED on 2/16 from vascular surgery outpatient office for evaluation of chest pain.  Patient reports 3 days of intermittent left-sided chest pain described as a squeezing/aching sensation with radiation to his back.  Onset at rest and continues to occur at rest with associated shortness of breath and pleuritic chest discomfort.  Notes that his dyspnea is worse when he is lying flat and reports swelling to his right leg which is chronic.  Denies diaphoresis, no nausea/vomiting, no abdominal pain.  Denies any similar symptoms in the past.  Continues to endorse tobacco use, 0.5-1 pack/day but denies any alcohol or illicit drug use.  In the ED, BP 150/79, HR 84, RR 18, temperature 98.4 F, SPO2 90% on room air.  WBC 5.3, hemoglobin 7.3, platelets 179, sodium 139, potassium 3.4, bicarb 28, BUN 18, creatinine 0.86, glucose 96, high sensitive troponin 17>18.  Chest x-ray with cardiomegaly, mild pulmonary edema and trace bilateral pleural effusions.  Nephrology consulted for dialysis while inpatient with recommendation of 1 unit PRBC during HD.  TRH consulted for further evaluation and management of acute onset chest pain.    Assessment & Plan:   Assessment and Plan: * Chest pain- (present on admission) Patient presenting with 3-day history of intermittent atypical chest pain localized to his left chest with intermittent radiation towards his back.  Patient reports squeezing/pressure-like in nature at times followed by sharp in nature.  Not improved with nitroglycerin tabs.  High sensitive troponin without significant elevation, 17 and 18 on repeat.  EKG with NSR, no concerning  dynamic changes.  Chest x-ray with mild pulmonary edema.  Etiology likely secondary to pulmonary edema versus anemia.  Patient was being worked up outpatient by vascular surgery for cardiac clearance for upcoming revision of his stump wound.  Etiology of his chest pain likely secondary to volume overload with pulmonary edema coupled with likely musculoskeletal pain with muscle spasm as chest discomfort improved with muscle relaxant. Patient with 3 days intermittent atypical chest pain.  Troponin without significant elevation, 17 and 18 on repeat.  EKG without acute ischemic changes.  Has pulmonary edema and peripheral edema suggestive of underlying CHF which is likely contributing to presenting symptoms.  Also may be related to worsening anemia.  Right lower extremity vascular duplex ultrasound negative for DVT.  TTE with LVEF 55-60%, LV mildly dilated, biatrial enlargement, IVC dilated.  Transfuse 1 unit PRBC on 2/17.  Cardiology initially following during hospitalization now signed off with plan outpatient coronary CTA and follow-up with Dr. Sallyanne Kuster in 1-2 weeks. --Continue aggressive volume management with HD --Robaxin as needed --Continue to monitor on telemetry  Acute on chronic diastolic CHF (congestive heart failure) ([place])- (present on admission) Patient presenting with pain associated with shortness of breath.  BNP elevated 521.8 with chest x-ray findings of mild pulmonary edema. --net negative 2.6L since admission --wt 124kg>>119.2kg --Continue HD for volume management --Renal diet with 1200 mL fluid restriction --Strict I's and O's and daily weights  PAD (peripheral artery disease) ([place])- (present on admission) S/p left BKA 04/16/2021 by vascular surgery, Dr. Scot Dock, for nonhealing left foot wound.  Patient having continued pain at stump and vascular surgery  considering revision pending further chest pain work-up.  CT tib/fib left with no osteomyelitis, no fluid collection or  abscess --Vascular surgery, plan stump revision today; NPO  Anemia due to chronic kidney disease- (present on admission) Hemoglobin 7.3 on admission, baseline usually 8-9.  No obvious bleeding.  FOBT is negative.  Transfusing 1 unit PRBC with HD per nephrology.  Anemia panel with iron 259, TIBC 357, ferritin 125; consistent with anemia of chronic medical disease. --Hgb 7.3>8.2>7.9 --Continue home ferrous sulfate 325 mg p.o. daily  Essential hypertension- (present on admission) --Clonidine 0.1mg  PO daily on MWF, BID on T/T/S/S --Amlodipine 10 mg p.o. daily.   --Hydralazine 10mg  IV q6h PRN SBP >180 or DBP >110.  End-stage renal disease on hemodialysis [facility]) --Nephrology following for HD needs  Tobacco use- (present on admission) Reports ongoing tobacco use, smoking 0.5-1 pack/day.  Smoking cessation advised.   --Nicotine patch  GERD (gastroesophageal reflux disease)- (present on admission) --Protonix 40 mg p.o. twice daily     DVT prophylaxis: heparin injection 5,000 Units Start: 06/13/21 2200    Code Status: Full Code Family Communication: No family present at bedside this morning  Disposition Plan:  Level of care: Telemetry Cardiac Status is: Inpatient Remains inpatient appropriate because: Pending stump revision today, anticipate discharge back to [place] likely tomorrow.    Consultants:  Cardiology - signed off 2/20 Vascular surgery Nephrology  Procedures:  TTE:  Antimicrobials:  None   Subjective: Patient seen examined bedside, currently on HD receiving hemodialysis.  Reports heaviness across his chest this morning, although much improved since initial hospitalization.  Pain improves with muscle relaxant.  Seen by cardiology this morning and now signed off with plan for outpatient CTA coronary and follow-up with Dr. Sallyanne Kuster in 1-2 weeks.  Pending stump revision this afternoon.  No other questions or concerns at this time.  Denies headache, no visual  changes, no palpitations, no shortness of breath, no abdominal pain, no weakness, no fatigue, no fever/chills/night sweats, no nausea/vomiting/diarrhea, no paresthesias.  No acute events overnight reported by nursing staff.  Objective: Vitals:   06/17/21 0830 06/17/21 0900 06/17/21 0930 06/17/21 1000  BP: (!) 169/73 (!) 144/65 (!) 155/74 (!) 153/75  Pulse: 77 80 83 83  Resp: 18 19 16 13   Temp:      TempSrc:      SpO2:      Weight:      Height:        Intake/Output Summary (Last 24 hours) at 06/17/2021 1003 Last data filed at 06/16/2021 2020 Gross per 24 hour  Intake 120 ml  Output 300 ml  Net -180 ml   Filed Weights   06/16/21 0355 06/17/21 0311 06/17/21 0805  Weight: 118.6 kg 120 kg 119.2 kg    Examination:  Physical Exam: GEN: NAD, alert and oriented x 3, chronically ill appearance, appears older than stated age HEENT: NCAT, PERRL, EOMI, sclera clear, MMM PULM: Mild crackles noted bilateral bases, no wheezing, normal respiratory effort without accessory muscle use, on room air with SPO2 96% at rest CV: RRR w/o M/G/R GI: abd soft, NTND, NABS, no R/G/M MSK: 1+ peripheral edema right lower extremity, left lower extremity BKA noted with Ace wrap in place, moves all extremities independently with strength globally intact.  NEURO: CN II-XII intact, no focal deficits, sensation to light touch intact PSYCH: normal mood/affect Integumentary: Right stump wound noted with ace wrap in place, otherwise no concerning lesions/rashes/wounds     Data Reviewed: I have personally reviewed following labs  and imaging studies  CBC: Recent Labs  Lab 06/13/21 1105 06/14/21 0356 06/15/21 0339 06/17/21 0358  WBC 5.3 4.4 5.4 4.8  NEUTROABS 2.4  --   --   --   HGB 7.3* 7.1* 8.2* 7.9*  HCT 23.4* 22.7* 24.5* 24.8*  MCV 107.8* 108.6* 101.7* 102.9*  PLT 179 172 171 564   Basic Metabolic Panel: Recent Labs  Lab 06/13/21 1105 06/14/21 0356 06/15/21 0339 06/17/21 0358  NA 139 138 133*  135  K 3.4* 3.4* 3.4* 3.3*  CL 97* 97* 95* 96*  CO2 28 27 25 26   GLUCOSE 96 126* 93 98  BUN 18 23 17 22   CREATININE 7.86* 8.86* 6.01* 6.50*  CALCIUM 9.4 9.6 9.2 10.0  PHOS  --   --  3.2  --    GFR: Estimated Creatinine Clearance: 15.7 mL/min (A) (by C-G formula based on SCr of 6.5 mg/dL (H)). Liver Function Tests: Recent Labs  Lab 06/13/21 1105 06/15/21 0339  AST 39  --   ALT 12  --   ALKPHOS 67  --   BILITOT 0.5  --   PROT 8.4*  --   ALBUMIN 2.5* 2.3*   No results for input(s): LIPASE, AMYLASE in the last 168 hours. No results for input(s): AMMONIA in the last 168 hours. Coagulation Profile: No results for input(s): INR, PROTIME in the last 168 hours. Cardiac Enzymes: No results for input(s): CKTOTAL, CKMB, CKMBINDEX, TROPONINI in the last 168 hours. BNP (last 3 results) No results for input(s): PROBNP in the last 8760 hours. HbA1C: No results for input(s): HGBA1C in the last 72 hours. CBG: Recent Labs  Lab 06/13/21 2046  GLUCAP 123*   Lipid Profile: No results for input(s): CHOL, HDL, LDLCALC, TRIG, CHOLHDL, LDLDIRECT in the last 72 hours. Thyroid Function Tests: No results for input(s): TSH, T4TOTAL, FREET4, T3FREE, THYROIDAB in the last 72 hours. Anemia Panel: Recent Labs    06/14/21 1139  FERRITIN 125  TIBC 357  IRON 259*   Sepsis Labs: No results for input(s): PROCALCITON, LATICACIDVEN in the last 168 hours.  Recent Results (from the past 240 hour(s))  Resp Panel by RT-PCR (Flu A&B, Covid) Nasopharyngeal Swab     Status: None   Collection Time: 06/13/21  6:40 PM   Specimen: Nasopharyngeal Swab; Nasopharyngeal(NP) swabs in vial transport medium  Result Value Ref Range Status   SARS Coronavirus 2 by RT PCR NEGATIVE NEGATIVE Final    Comment: (NOTE) SARS-CoV-2 target nucleic acids are NOT DETECTED.  The SARS-CoV-2 RNA is generally detectable in upper respiratory specimens during the acute phase of infection. The lowest concentration of SARS-CoV-2  viral copies this assay can detect is 138 copies/mL. A negative result does not preclude SARS-Cov-2 infection and should not be used as the sole basis for treatment or other patient management decisions. A negative result may occur with  improper specimen collection/handling, submission of specimen other than nasopharyngeal swab, presence of viral mutation(s) within the areas targeted by this assay, and inadequate number of viral copies(<138 copies/mL). A negative result must be combined with clinical observations, patient history, and epidemiological information. The expected result is Negative.  Fact Sheet for Patients:  EntrepreneurPulse.com.au  Fact Sheet for Healthcare Providers:  IncredibleEmployment.be  This test is no t yet approved or cleared by the [place] FDA and  has been authorized for detection and/or diagnosis of SARS-CoV-2 by FDA under an Emergency Use Authorization (EUA). This EUA will remain  in effect (meaning this test can be used)  for the duration of the COVID-19 declaration under Section 564(b)(1) of the Act, 21 U.S.C.section 360bbb-3(b)(1), unless the authorization is terminated  or revoked sooner.       Influenza A by PCR NEGATIVE NEGATIVE Final   Influenza B by PCR NEGATIVE NEGATIVE Final    Comment: (NOTE) The Xpert Xpress SARS-CoV-2/FLU/RSV plus assay is intended as an aid in the diagnosis of influenza from Nasopharyngeal swab specimens and should not be used as a sole basis for treatment. Nasal washings and aspirates are unacceptable for Xpert Xpress SARS-CoV-2/FLU/RSV testing.  Fact Sheet for Patients: EntrepreneurPulse.com.au  Fact Sheet for Healthcare Providers: IncredibleEmployment.be  This test is not yet approved or cleared by the [place] FDA and has been authorized for detection and/or diagnosis of SARS-CoV-2 by FDA under an Emergency Use Authorization  (EUA). This EUA will remain in effect (meaning this test can be used) for the duration of the COVID-19 declaration under Section 564(b)(1) of the Act, 21 U.S.C. section 360bbb-3(b)(1), unless the authorization is terminated or revoked.  Performed at [facility], [place] [address] [place], [place] [address]   MRSA Next Gen by PCR, Nasal     Status: None   Collection Time: 06/14/21  4:12 AM   Specimen: Nasal Mucosa; Nasal Swab  Result Value Ref Range Status   MRSA by PCR Next Gen NOT DETECTED NOT DETECTED Final    Comment: (NOTE) The GeneXpert MRSA Assay (FDA approved for NASAL specimens only), is one component of a comprehensive MRSA colonization surveillance program. It is not intended to diagnose MRSA infection nor to guide or monitor treatment for MRSA infections. Test performance is not FDA approved in patients less than 87 years old. Performed at [facility], [place] [address] [place], [place] [address]          Radiology Studies: No results found.      Scheduled Meds:  amLODipine  10 mg Oral QHS   calcium acetate  667 mg Oral TID WC   Chlorhexidine Gluconate Cloth  6 each Topical Q0600   cloNIDine  0.1 mg Oral Once per day on Mon Wed Fri   cloNIDine  0.1 mg Oral 2 times per day on Sun Tue Thu Sat   doxercalciferol  3 mcg Intravenous Q M,W,F-HD   ferrous sulfate  325 mg Oral q morning   gabapentin  100 mg Oral TID   heparin  5,000 Units Subcutaneous Q8H   heparin sodium (porcine)       multivitamin  1 tablet Oral QHS   nicotine  21 mg Transdermal Daily   pantoprazole  40 mg Oral BID   sodium chloride flush  3 mL Intravenous Q12H   Continuous Infusions:   ceFAZolin (ANCEF) IV     sodium thiosulfate infusion for calciphylaxis Stopped (06/15/21 1204)     LOS: 3 days    Time spent: 49 minutes spent on chart review, discussion with nursing staff, consultants, updating family and interview/physical exam; more than 50% of that time was spent in  counseling and/or coordination of care.    Alisa Stjames J [place] DO [facility] Available via Epic secure chat 7am-7pm After these hours, please refer to coverage provider listed on [email or web address] 06/17/2021, 10:03 AM

## 2021-06-17 NOTE — Progress Notes (Signed)
°  Daily Progress Note   Subjective: Doing well this afternoon. No complaints.  Objective: Vitals:   06/17/21 1256 06/17/21 1316  BP: 139/66 139/62  Pulse: 85   Resp: 18 18  Temp:  98.3 F (36.8 C)  SpO2: 98% 98%    Physical Examination BKA stump wrapped.  Regular rate Non-labored breathing  ASSESSMENT/PLAN:  Daniel [name] is a 61 year old male who presents with poor wound healing of previous to the left below-knee amputation.  He would benefit from wound debridement as there appears to be localized infection.  CT demonstrated no concern for abscess, no bony involvement.  After discussing the risks and benefits of BKA revision with Delfino Lovett, he elected to proceed.   Cassandria Santee MD MS [facility] [phone] 06/17/2021  1:50 PM

## 2021-06-18 ENCOUNTER — Encounter (HOSPITAL_COMMUNITY): Payer: Self-pay | Admitting: Vascular Surgery

## 2021-06-18 ENCOUNTER — Inpatient Hospital Stay (HOSPITAL_COMMUNITY): Payer: Medicare Other

## 2021-06-18 DIAGNOSIS — I5033 Acute on chronic diastolic (congestive) heart failure: Secondary | ICD-10-CM | POA: Diagnosis not present

## 2021-06-18 DIAGNOSIS — N186 End stage renal disease: Secondary | ICD-10-CM | POA: Diagnosis not present

## 2021-06-18 DIAGNOSIS — I724 Aneurysm of artery of lower extremity: Secondary | ICD-10-CM

## 2021-06-18 DIAGNOSIS — R079 Chest pain, unspecified: Secondary | ICD-10-CM | POA: Diagnosis not present

## 2021-06-18 DIAGNOSIS — I10 Essential (primary) hypertension: Secondary | ICD-10-CM | POA: Diagnosis not present

## 2021-06-18 LAB — CBC
HCT: 22.9 % — ABNORMAL LOW (ref 39.0–52.0)
Hemoglobin: 7.6 g/dL — ABNORMAL LOW (ref 13.0–17.0)
MCH: 33.8 pg (ref 26.0–34.0)
MCHC: 33.2 g/dL (ref 30.0–36.0)
MCV: 101.8 fL — ABNORMAL HIGH (ref 80.0–100.0)
Platelets: 149 10*3/uL — ABNORMAL LOW (ref 150–400)
RBC: 2.25 MIL/uL — ABNORMAL LOW (ref 4.22–5.81)
RDW: 17.8 % — ABNORMAL HIGH (ref 11.5–15.5)
WBC: 4.8 10*3/uL (ref 4.0–10.5)
nRBC: 0 % (ref 0.0–0.2)

## 2021-06-18 LAB — RENAL FUNCTION PANEL
Albumin: 2.2 g/dL — ABNORMAL LOW (ref 3.5–5.0)
Anion gap: 15 (ref 5–15)
BUN: 12 mg/dL (ref 8–23)
CO2: 27 mmol/L (ref 22–32)
Calcium: 9.2 mg/dL (ref 8.9–10.3)
Chloride: 96 mmol/L — ABNORMAL LOW (ref 98–111)
Creatinine, Ser: 4.57 mg/dL — ABNORMAL HIGH (ref 0.61–1.24)
GFR, Estimated: 14 mL/min — ABNORMAL LOW (ref >60)
Glucose, Bld: 105 mg/dL — ABNORMAL HIGH (ref 70–99)
Phosphorus: 2 mg/dL — ABNORMAL LOW (ref 2.5–4.6)
Potassium: 3.1 mmol/L — ABNORMAL LOW (ref 3.5–5.1)
Sodium: 138 mmol/L (ref 135–145)

## 2021-06-18 MED ORDER — POTASSIUM CHLORIDE CRYS ER 20 MEQ PO TBCR
40.0000 meq | EXTENDED_RELEASE_TABLET | Freq: Once | ORAL | Status: AC
  Administered 2021-06-18: 40 meq via ORAL
  Filled 2021-06-18: qty 2

## 2021-06-18 MED ORDER — OXYCODONE-ACETAMINOPHEN 5-325 MG PO TABS
1.0000 | ORAL_TABLET | ORAL | Status: DC | PRN
  Administered 2021-06-18 (×2): 1 via ORAL
  Administered 2021-06-19 – 2021-06-21 (×8): 2 via ORAL
  Filled 2021-06-18: qty 2
  Filled 2021-06-18: qty 1
  Filled 2021-06-18 (×5): qty 2
  Filled 2021-06-18: qty 1
  Filled 2021-06-18 (×2): qty 2
  Filled 2021-06-18: qty 1
  Filled 2021-06-18: qty 2

## 2021-06-18 MED ORDER — HYDROMORPHONE HCL 1 MG/ML IJ SOLN
0.5000 mg | Freq: Once | INTRAMUSCULAR | Status: AC
  Administered 2021-06-18: 0.5 mg via INTRAVENOUS
  Filled 2021-06-18: qty 0.5

## 2021-06-18 MED ORDER — CEPHALEXIN 250 MG PO CAPS
500.0000 mg | ORAL_CAPSULE | ORAL | Status: DC
  Administered 2021-06-18 – 2021-06-21 (×4): 500 mg via ORAL
  Filled 2021-06-18 (×4): qty 2

## 2021-06-18 NOTE — Care Management Important Message (Signed)
Important Message  Patient Details  Name: Daniel [name] MRN: [id number] Date of Birth: Oct 18, 1960   Medicare Important Message Given:  Yes     Shelda Altes 06/18/2021, 8:47 AM

## 2021-06-18 NOTE — Progress Notes (Signed)
[facility] Progress Note    Assessment/ Plan:   # PAD -VVS following, sp L stump revision 2/20  # ESRD:  -outpatient HD orders: [facility]. MWF 4h  450/500  125 kg  2/2 bath  Hep none  RIJ TDC/ LUA AVF.  Mircera 225 mcg every 2 weeks (last dose: 2/10), Hectorol 55mcg qtx - HD tomorrow on schedule   # Vascular access: active access TDC and LUA AVF was plicated by Dr Scot Dock on 04/19/21. Per VVS it is okay to use on 06/14/21. Plan 1st use tomorrow.   # Chest pain - on admission -troponins not significantly elevated -possible related to underlying CHF and/or anemia -chest pain resolved -per pmd   # Volume/ hypertension: EDW 125kg, he is down about 6kg here and is also now 6kg under his dry wt.  Vol overload was due to lean body wt loss and has now been corrected. Lower dry wt at dc.    # Symptomatic anemia, anemia of Chronic Kidney Disease, macrocytosis: Hemoglobin 7.3 on admission . Anemia qhs been an ongoing issue. Currently receiving max dose esa as an outpatient. FOBT neg. R/O bleed per pmd. Iron replete on panel -s/p 1u prbc w/ HD 2/17, hgb better -not due for ESA yet   # Secondary Hyperparathyroidism/Hyperphosphatemia: check phos, on nathiosulfate      Kelly Splinter, MD 06/18/2021, 12:36 PM      Subjective:   Seen  in room, postop pain L leg today   Objective:   BP 136/79 (BP Location: Right Arm)    Pulse 67    Temp (!) 97.4 F (36.3 C) (Oral)    Resp 15    Ht 5\' 11"  (1.803 m)    Wt 118.6 kg    SpO2 92%    BMI 36.47 kg/m   Intake/Output Summary (Last 24 hours) at 06/18/2021 1236 Last data filed at 06/18/2021 1032 Gross per 24 hour  Intake 1250 ml  Output --  Net 1250 ml    Weight change: -0.8 kg  Physical Exam: Gen:nad CVS:rrr Resp:normal wob ZOX:WRUE AVW:UJWJ bka, trace RLE edema Neuro: awake, alert Dialysis access: RIJ TDC  Imaging: No results found.  Labs: BMET Recent Labs  Lab 06/13/21 1105 06/14/21 0356 06/15/21 0339  06/17/21 0358 06/18/21 0136  NA 139 138 133* 135 138  K 3.4* 3.4* 3.4* 3.3* 3.1*  CL 97* 97* 95* 96* 96*  CO2 28 27 25 26 27   GLUCOSE 96 126* 93 98 105*  BUN 18 23 17 22 12   CREATININE 7.86* 8.86* 6.01* 6.50* 4.57*  CALCIUM 9.4 9.6 9.2 10.0 9.2  PHOS  --   --  3.2  --  2.0*    CBC Recent Labs  Lab 06/13/21 1105 06/14/21 0356 06/15/21 0339 06/17/21 0358 06/18/21 0136  WBC 5.3 4.4 5.4 4.8 4.8  NEUTROABS 2.4  --   --   --   --   HGB 7.3* 7.1* 8.2* 7.9* 7.6*  HCT 23.4* 22.7* 24.5* 24.8* 22.9*  MCV 107.8* 108.6* 101.7* 102.9* 101.8*  PLT 179 172 171 158 149*     Medications:     amLODipine  10 mg Oral QHS   calcium acetate  667 mg Oral TID WC   cephALEXin  500 mg Oral Q24H   Chlorhexidine Gluconate Cloth  6 each Topical Q0600   cloNIDine  0.1 mg Oral Once per day on Mon Wed Fri   cloNIDine  0.1 mg Oral 2 times per day on Sun Tue Thu Sat  doxercalciferol  3 mcg Intravenous Q M,W,F-HD   ferrous sulfate  325 mg Oral q morning   gabapentin  100 mg Oral TID   heparin  5,000 Units Subcutaneous Q8H   multivitamin  1 tablet Oral QHS   nicotine  21 mg Transdermal Daily   pantoprazole  40 mg Oral BID   sodium chloride flush  3 mL Intravenous Q12H      Gean Quint, MD [facility] 06/18/2021, 12:36 PM

## 2021-06-18 NOTE — Progress Notes (Signed)
Please attempt HD through the pts fistula tomorrow. Should there be issues, will pursue fistulagram while in house.

## 2021-06-18 NOTE — Progress Notes (Addendum)
°  Progress Note    06/18/2021 7:47 AM 1 Day Post-Op  Subjective:  no complaints. Sitting up eating breakfast. Says VAC came off because of bleeding. VAC not replaced. Dressings placed by RN   Vitals:   06/17/21 2133 06/18/21 0500  BP: (!) 152/61   Pulse:  75  Resp:  16  Temp:  97.8 F (36.6 C)  SpO2:  90%   Physical Exam: Cardiac:  regular Lungs:  non labored Incisions:  left BKA dressings with drainage Neurologic: alert and oriented  CBC    Component Value Date/Time   WBC 4.8 06/18/2021 0136   RBC 2.25 (L) 06/18/2021 0136   HGB 7.6 (L) 06/18/2021 0136   HGB 8.4 (L) 08/22/2020 1141   HCT 22.9 (L) 06/18/2021 0136   HCT 25.0 (L) 08/22/2020 1141   PLT 149 (L) 06/18/2021 0136   PLT 184 08/22/2020 1141   MCV 101.8 (H) 06/18/2021 0136   MCV 94 08/22/2020 1141   MCH 33.8 06/18/2021 0136   MCHC 33.2 06/18/2021 0136   RDW 17.8 (H) 06/18/2021 0136   RDW 16.7 (H) 08/22/2020 1141   LYMPHSABS 2.0 06/13/2021 1105   LYMPHSABS 1.8 08/22/2020 1141   MONOABS 0.8 06/13/2021 1105   EOSABS 0.1 06/13/2021 1105   EOSABS 0.3 08/22/2020 1141   BASOSABS 0.0 06/13/2021 1105   BASOSABS 0.0 08/22/2020 1141    BMET    Component Value Date/Time   NA 138 06/18/2021 0136   NA 138 08/22/2020 1141   K 3.1 (L) 06/18/2021 0136   CL 96 (L) 06/18/2021 0136   CO2 27 06/18/2021 0136   GLUCOSE 105 (H) 06/18/2021 0136   BUN 12 06/18/2021 0136   BUN 20 08/22/2020 1141   CREATININE 4.57 (H) 06/18/2021 0136   CALCIUM 9.2 06/18/2021 0136   CALCIUM 7.9 (L) 08/01/2019 0702   GFRNONAA 14 (L) 06/18/2021 0136   GFRAA 7 (L) 12/28/2019 1640    INR    Component Value Date/Time   INR 1.0 03/24/2021 1831     Intake/Output Summary (Last 24 hours) at 06/18/2021 0747 Last data filed at 06/18/2021 0500 Gross per 24 hour  Intake 1010 ml  Output 3000 ml  Net -1990 ml     Assessment/Plan:  61 y.o. male is s/p left BKA amp washout and debridement with VAC placement 1 Day Post-Op   VAC came off  overnight and was not replaced Dressings with drainage present Will try to replace VAC later this morning after OR May need home wound VAC if we can obtain good seal. Will place orders if needed  Karoline Caldwell, PA-C [facility] [phone] 06/18/2021 7:47 AM  VASCULAR STAFF ADDENDUM: I have independently interviewed and examined the patient. I agree with the above.  Pt would benefit from continued VAC therapy for the next month.  Wound size 11x2x1cm Recommend short course of IV abx. Patient currently in skilled nursing facility-please reach out should they not except VAC dressings. VAC change Friday  Cassandria Santee, MD [facility] Phone Number: [phone] 06/18/2021 11:22 AM

## 2021-06-18 NOTE — TOC Progression Note (Signed)
Transition of Care [facility]) - Progression Note    Patient Details  Name: Daniel [name] MRN: [id number] Date of Birth: Nov 23, 1960  Transition of [facility]) CM/SW Contact  Zenon Mayo, RN Phone Number: 06/18/2021, 3:07 PM  Clinical Narrative:    NCM spoke with CSW , Symone, made aware of the wound vac dressing, Symone is to reach out to [facility] to let them know to order the wound vac to be ready when patient returns.         Expected Discharge Plan and Services                                                 Social Determinants of Health (SDOH) Interventions    Readmission Risk Interventions Readmission Risk Prevention Plan 02/27/2021 07/07/2019  Transportation Screening Complete Complete  PCP or Specialist Appt within 3-5 Days - Complete  HRI or Railroad - Complete  Social Work Consult for [place] Planning/Counseling - Complete  Palliative Care Screening - Not Applicable  Medication Review Press photographer) Complete Complete  PCP or Specialist appointment within 3-5 days of discharge Complete -  [place] or Home Care Consult Complete -  SW Recovery Care/Counseling Consult Complete -  Palliative Care Screening Not Applicable -  [place] Not Applicable -  Some recent data might be hidden

## 2021-06-18 NOTE — Progress Notes (Signed)
AVF duplex completed. Refer to "CV Proc" under chart review to view preliminary results.  06/18/2021 1:55 PM Kelby Aline., MHA, RVT, RDCS, RDMS

## 2021-06-18 NOTE — Progress Notes (Signed)
PHARMACY NOTE:  ANTIMICROBIAL RENAL DOSAGE ADJUSTMENT  Current antimicrobial regimen includes a mismatch between antimicrobial dosage and estimated renal function.  As per policy approved by the Pharmacy & Therapeutics and Medical Executive Committees, the antimicrobial dosage will be adjusted accordingly.  Current antimicrobial dosage:  Keflex 500mg  PO q8h x 7 days  Indication: Wound infection  Renal Function:  Estimated Creatinine Clearance: 22.2 mL/min (A) (by C-G formula based on SCr of 4.57 mg/dL (H)). [x]      On intermittent HD, scheduled: []      On CRRT    Antimicrobial dosage has been changed to:  Keflex 500mg  PO daily x 7 days  Additional comments:   Juaquin Ludington A. Levada Dy, PharmD, BCPS, FNKF Clinical Pharmacist [place] Please utilize Amion for appropriate phone number to reach the unit pharmacist ([place])  06/18/2021 11:12 AM

## 2021-06-18 NOTE — Telephone Encounter (Signed)
**Note De-Identified  Obfuscation** The pt is currently in [facility]. Will continue to monitor his chart and will call the pt once d/c'd from hospital.

## 2021-06-18 NOTE — Progress Notes (Signed)
PROGRESS NOTE    Daniel [name]  DQQ:[id number] DOB: [birth year] DOA: 06/13/2021 PCP: [name], NP    Brief Narrative:  Daniel [name] is a 61 y.o. 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  Patient reports 3 days of intermittent left-sided chest pain described as a squeezing/aching sensation with radiation to his back.  Onset at rest and continues to occur at rest with associated shortness of breath and pleuritic chest discomfort.  Notes that his dyspnea is worse when he is lying flat and reports swelling to his right leg which is chronic.  Denies diaphoresis, no nausea/vomiting, no abdominal pain.  Denies any similar symptoms in the past.  Continues to endorse tobacco use, 0.5-1 pack/day but denies any alcohol or illicit drug use.  In the ED, BP 150/79, HR 84, RR 18, temperature 98.4 F, SPO2 90% on room air.  WBC 5.3, hemoglobin 7.3, platelets 179, sodium 139, potassium 3.4, bicarb 28, BUN 18, creatinine 0.86, glucose 96, high sensitive troponin 17>18.  Chest x-ray with cardiomegaly, mild pulmonary edema and trace bilateral pleural effusions.  Nephrology consulted for dialysis while inpatient with recommendation of 1 unit PRBC during HD.  TRH consulted for further evaluation and management of acute onset chest pain.    Assessment & Plan:   Assessment and Plan: * Chest pain- (present on admission) Patient presenting with 3-day history of intermittent atypical chest pain localized to his left chest with intermittent radiation towards his back.  Patient reports squeezing/pressure-like in nature at times followed by sharp in nature.  Not improved with nitroglycerin tabs.  High sensitive troponin without significant elevation, 17 and 18 on repeat.  EKG with NSR, no concerning  dynamic changes.  Chest x-ray with mild pulmonary edema.  Etiology likely secondary to pulmonary edema versus anemia.  Patient was being worked up outpatient by vascular surgery for cardiac clearance for upcoming revision of his stump wound.  Etiology of his chest pain likely secondary to volume overload with pulmonary edema coupled with likely musculoskeletal pain with muscle spasm as chest discomfort improved with muscle relaxant. Patient with 3 days intermittent atypical chest pain.  Troponin without significant elevation, 17 and 18 on repeat.  EKG without acute ischemic changes.  Has pulmonary edema and peripheral edema suggestive of underlying CHF which is likely contributing to presenting symptoms.  Also may be related to worsening anemia.  Right lower extremity vascular duplex ultrasound negative for DVT.  TTE with LVEF 55-60%, LV mildly dilated, biatrial enlargement, IVC dilated.  Transfuse 1 unit PRBC on 2/17.  Cardiology initially following during hospitalization now signed off with plan outpatient coronary CTA and follow-up with Dr. Sallyanne Kuster in 1-2 weeks. --Continue aggressive volume management with HD --Robaxin as needed --Continue to monitor on telemetry  Acute on chronic diastolic CHF (congestive heart failure) ([place])- (present on admission) Patient presenting with pain associated with shortness of breath.  BNP elevated 521.8 with chest x-ray findings of mild pulmonary edema. --net negative 4.6L since admission --wt 124kg>>119.2>117.9>118.6kg --Continue HD for volume management --Renal diet with 1200 mL fluid restriction --Strict I's and O's and daily weights  PAD (peripheral artery disease) ([place])- (present on admission) S/p left BKA 04/16/2021 by vascular surgery, Dr. Scot Dock, for nonhealing left foot wound.  Patient having continued pain at stump and vascular surgery  considering revision pending further chest pain work-up.  CT tib/fib left with no osteomyelitis, no fluid collection or  abscess.  Patient underwent stump revision by vascular surgery on 06/17/2021 with wound VAC initially placed, but unfortunately was removed overnight.  No reported abscess, no drainage and no culture was sent but surgery recommended 7-day course of antibiotics. --Vascular surgery following, may replace wound VAC later this afternoon --Keflex 500 mg p.o. every 8 hours x7 days per vascular surgery recommendations -- Further per vascular surgery  Anemia due to chronic kidney disease- (present on admission) Hemoglobin 7.3 on admission, baseline usually 8-9.  No obvious bleeding.  FOBT is negative.  Transfusing 1 unit PRBC with HD per nephrology.  Anemia panel with iron 259, TIBC 357, ferritin 125; consistent with anemia of chronic medical disease. --Hgb 7.3>8.2>7.9>7.6 --Continue home ferrous sulfate 325 mg p.o. daily  Essential hypertension- (present on admission) --Clonidine 0.1mg  PO daily on MWF, BID on T/T/S/S --Amlodipine 10 mg p.o. daily.   --Hydralazine 10mg  IV q6h PRN SBP >180 or DBP >110.  End-stage renal disease on hemodialysis [facility]) Nephrology following for HD needs.  Currently receiving HD through temporary dialysis catheter, per vascular surgery can access fistula starting 06/14/2021.  Will discuss with nephrology, may be able to access fistula during HD on 06/19/2021, and if okay can hopefully discontinue TDC before returning to SNF.  Tobacco use- (present on admission) Reports ongoing tobacco use, smoking 0.5-1 pack/day.  Smoking cessation advised.   --Nicotine patch  GERD (gastroesophageal reflux disease)- (present on admission) --Protonix 40 mg p.o. twice daily     DVT prophylaxis: heparin injection 5,000 Units Start: 06/13/21 2200    Code Status: Full Code Family Communication: No family present at bedside this morning  Disposition Plan:  Level of care: Telemetry Cardiac Status is: Inpatient Remains inpatient appropriate because: Will likely need wound VAC replaced by  vascular surgery, plan for HD tomorrow with hopeful for fistula access and possible discontinuation of dialysis catheter, possible discharge back to [facility] 2/22 following HD.    Consultants:  Cardiology - signed off 2/20 Vascular surgery Nephrology  Procedures:  TTE:  Antimicrobials:  None   Subjective: Patient seen examined bedside, lying in bed.  No specific complaints.  Wound VAC was removed overnight; dressing in place that is saturated with serosanguineous fluid posteriorly.  Seen by vascular surgery this morning, plan to attempt to replace wound VAC this afternoon.  Per previous vascular surgery notes, okay for fistula access on 2/17, will see if nephrology wants to attempt this during HD tomorrow; if so can likely remove temporary HD catheter tomorrow and discharge home.  Patient updated on this plan of care.  No other concerns or questions at this time.  Denies headache, no chest pain, no palpitations, no shortness of breath, no abdominal pain, no weakness, no fatigue, no fever/chills/night sweats, no nausea/vomiting/diarrhea, no paresthesias.  No acute events overnight per nursing staff  Objective: Vitals:   06/17/21 1955 06/17/21 2133 06/18/21 0500 06/18/21 0856  BP: (!) 151/59 (!) 152/61  136/79  Pulse: 82  75 67  Resp: 16  16 15   Temp: 97.6 F (36.4 C)  97.8 F (36.6 C) (!) 97.4 F (36.3 C)  TempSrc: Oral  Axillary Oral  SpO2: 92%  90% 92%  Weight:   118.6 kg   Height:        Intake/Output Summary (Last 24 hours) at 06/18/2021 1100 Last data filed at 06/18/2021 1032 Gross per 24 hour  Intake 1250 ml  Output 3000 ml  Net -1750 ml   Filed Weights   06/17/21 1145 06/17/21 1316 06/18/21 0500  Weight: 116 kg 117.9 kg 118.6 kg    Examination:  Physical Exam: GEN: NAD, alert and oriented x 3, chronically ill appearance, appears older than stated age HEENT: NCAT, PERRL, EOMI, sclera clear, MMM PULM: Mild crackles noted bilateral bases, no wheezing,  normal respiratory effort without accessory muscle use, on room air with SPO2 96% at rest CV: RRR w/o M/G/R, TDC noted GI: abd soft, NTND, NABS, no R/G/M MSK: 1+ peripheral edema right lower extremity, left lower extremity BKA noted with Ace wrap in place, moves all extremities independently with strength globally intact.  NEURO: CN II-XII intact, no focal deficits, sensation to light touch intact PSYCH: normal mood/affect Integumentary: Right stump wound noted with dressing in place, serosanguineous fluid noted posterior side, otherwise no concerning lesions/rashes/wounds     Data Reviewed: I have personally reviewed following labs and imaging studies  CBC: Recent Labs  Lab 06/13/21 1105 06/14/21 0356 06/15/21 0339 06/17/21 0358 06/18/21 0136  WBC 5.3 4.4 5.4 4.8 4.8  NEUTROABS 2.4  --   --   --   --   HGB 7.3* 7.1* 8.2* 7.9* 7.6*  HCT 23.4* 22.7* 24.5* 24.8* 22.9*  MCV 107.8* 108.6* 101.7* 102.9* 101.8*  PLT 179 172 171 158 191*   Basic Metabolic Panel: Recent Labs  Lab 06/13/21 1105 06/14/21 0356 06/15/21 0339 06/17/21 0358 06/18/21 0136  NA 139 138 133* 135 138  K 3.4* 3.4* 3.4* 3.3* 3.1*  CL 97* 97* 95* 96* 96*  CO2 28 27 25 26 27   GLUCOSE 96 126* 93 98 105*  BUN 18 23 17 22 12   CREATININE 7.86* 8.86* 6.01* 6.50* 4.57*  CALCIUM 9.4 9.6 9.2 10.0 9.2  PHOS  --   --  3.2  --  2.0*   GFR: Estimated Creatinine Clearance: 22.2 mL/min (A) (by C-G formula based on SCr of 4.57 mg/dL (H)). Liver Function Tests: Recent Labs  Lab 06/13/21 1105 06/15/21 0339 06/18/21 0136  AST 39  --   --   ALT 12  --   --   ALKPHOS 67  --   --   BILITOT 0.5  --   --   PROT 8.4*  --   --   ALBUMIN 2.5* 2.3* 2.2*   No results for input(s): LIPASE, AMYLASE in the last 168 hours. No results for input(s): AMMONIA in the last 168 hours. Coagulation Profile: No results for input(s): INR, PROTIME in the last 168 hours. Cardiac Enzymes: No results for input(s): CKTOTAL, CKMB,  CKMBINDEX, TROPONINI in the last 168 hours. BNP (last 3 results) No results for input(s): PROBNP in the last 8760 hours. HbA1C: No results for input(s): HGBA1C in the last 72 hours. CBG: Recent Labs  Lab 06/13/21 2046 06/17/21 1253 06/17/21 1446  GLUCAP 123* 88 101*   Lipid Profile: No results for input(s): CHOL, HDL, LDLCALC, TRIG, CHOLHDL, LDLDIRECT in the last 72 hours. Thyroid Function Tests: No results for input(s): TSH, T4TOTAL, FREET4, T3FREE, THYROIDAB in the last 72 hours. Anemia Panel: No results for input(s): VITAMINB12, FOLATE, FERRITIN, TIBC, IRON, RETICCTPCT in the last 72 hours.  Sepsis Labs: No results for input(s): PROCALCITON, LATICACIDVEN in the last 168 hours.  Recent Results (from the past 240 hour(s))  Resp Panel by RT-PCR (Flu A&B, Covid) Nasopharyngeal Swab     Status: None   Collection Time: 06/13/21  6:40 PM   Specimen: Nasopharyngeal Swab;  Nasopharyngeal(NP) swabs in vial transport medium  Result Value Ref Range Status   SARS Coronavirus 2 by RT PCR NEGATIVE NEGATIVE Final    Comment: (NOTE) SARS-CoV-2 target nucleic acids are NOT DETECTED.  The SARS-CoV-2 RNA is generally detectable in upper respiratory specimens during the acute phase of infection. The lowest concentration of SARS-CoV-2 viral copies this assay can detect is 138 copies/mL. A negative result does not preclude SARS-Cov-2 infection and should not be used as the sole basis for treatment or other patient management decisions. A negative result may occur with  improper specimen collection/handling, submission of specimen other than nasopharyngeal swab, presence of viral mutation(s) within the areas targeted by this assay, and inadequate number of viral copies(<138 copies/mL). A negative result must be combined with clinical observations, patient history, and epidemiological information. The expected result is Negative.  Fact Sheet for Patients:   EntrepreneurPulse.com.au  Fact Sheet for Healthcare Providers:  IncredibleEmployment.be  This test is no t yet approved or cleared by the [place] FDA and  has been authorized for detection and/or diagnosis of SARS-CoV-2 by FDA under an Emergency Use Authorization (EUA). This EUA will remain  in effect (meaning this test can be used) for the duration of the COVID-19 declaration under Section 564(b)(1) of the Act, 21 U.S.C.section 360bbb-3(b)(1), unless the authorization is terminated  or revoked sooner.       Influenza A by PCR NEGATIVE NEGATIVE Final   Influenza B by PCR NEGATIVE NEGATIVE Final    Comment: (NOTE) The Xpert Xpress SARS-CoV-2/FLU/RSV plus assay is intended as an aid in the diagnosis of influenza from Nasopharyngeal swab specimens and should not be used as a sole basis for treatment. Nasal washings and aspirates are unacceptable for Xpert Xpress SARS-CoV-2/FLU/RSV testing.  Fact Sheet for Patients: EntrepreneurPulse.com.au  Fact Sheet for Healthcare Providers: IncredibleEmployment.be  This test is not yet approved or cleared by the [place] FDA and has been authorized for detection and/or diagnosis of SARS-CoV-2 by FDA under an Emergency Use Authorization (EUA). This EUA will remain in effect (meaning this test can be used) for the duration of the COVID-19 declaration under Section 564(b)(1) of the Act, 21 U.S.C. section 360bbb-3(b)(1), unless the authorization is terminated or revoked.  Performed at [facility], [place] [address], [place], [place] [address]   MRSA Next Gen by PCR, Nasal     Status: None   Collection Time: 06/14/21  4:12 AM   Specimen: Nasal Mucosa; Nasal Swab  Result Value Ref Range Status   MRSA by PCR Next Gen NOT DETECTED NOT DETECTED Final    Comment: (NOTE) The GeneXpert MRSA Assay (FDA approved for NASAL specimens only), is one component of a  comprehensive MRSA colonization surveillance program. It is not intended to diagnose MRSA infection nor to guide or monitor treatment for MRSA infections. Test performance is not FDA approved in patients less than 71 years old. Performed at [facility], [place] [address] [place], [place] [address]          Radiology Studies: No results found.      Scheduled Meds:  amLODipine  10 mg Oral QHS   calcium acetate  667 mg Oral TID WC   cephALEXin  500 mg Oral Q8H   Chlorhexidine Gluconate Cloth  6 each Topical Q0600   cloNIDine  0.1 mg Oral Once per day on Mon Wed Fri   cloNIDine  0.1 mg Oral 2 times per day on Sun Tue Thu Sat  doxercalciferol  3 mcg Intravenous Q M,W,F-HD   ferrous sulfate  325 mg Oral q morning   gabapentin  100 mg Oral TID   heparin  5,000 Units Subcutaneous Q8H   multivitamin  1 tablet Oral QHS   nicotine  21 mg Transdermal Daily   pantoprazole  40 mg Oral BID   sodium chloride flush  3 mL Intravenous Q12H   Continuous Infusions:  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    Johnwilliam Shepperson J [place] DO [facility] Available via Epic secure chat 7am-7pm After these hours, please refer to coverage provider listed on [email or web address] 06/18/2021, 11:00 AM

## 2021-06-19 ENCOUNTER — Encounter (HOSPITAL_COMMUNITY)
Admission: EM | Disposition: A | Discharge status: Skilled Nursing Facility | Payer: Self-pay | Source: Skilled Nursing Facility | Attending: Internal Medicine

## 2021-06-19 DIAGNOSIS — I5033 Acute on chronic diastolic (congestive) heart failure: Secondary | ICD-10-CM | POA: Diagnosis not present

## 2021-06-19 DIAGNOSIS — E1169 Type 2 diabetes mellitus with other specified complication: Secondary | ICD-10-CM | POA: Diagnosis not present

## 2021-06-19 DIAGNOSIS — R079 Chest pain, unspecified: Secondary | ICD-10-CM | POA: Diagnosis not present

## 2021-06-19 DIAGNOSIS — N186 End stage renal disease: Secondary | ICD-10-CM | POA: Diagnosis not present

## 2021-06-19 LAB — RENAL FUNCTION PANEL
Albumin: 2.3 g/dL — ABNORMAL LOW (ref 3.5–5.0)
Anion gap: 18 — ABNORMAL HIGH (ref 5–15)
BUN: 20 mg/dL (ref 8–23)
CO2: 24 mmol/L (ref 22–32)
Calcium: 10.4 mg/dL — ABNORMAL HIGH (ref 8.9–10.3)
Chloride: 94 mmol/L — ABNORMAL LOW (ref 98–111)
Creatinine, Ser: 6.57 mg/dL — ABNORMAL HIGH (ref 0.61–1.24)
GFR, Estimated: 9 mL/min — ABNORMAL LOW (ref >60)
Glucose, Bld: 100 mg/dL — ABNORMAL HIGH (ref 70–99)
Phosphorus: 3.8 mg/dL (ref 2.5–4.6)
Potassium: 3.9 mmol/L (ref 3.5–5.1)
Sodium: 136 mmol/L (ref 135–145)

## 2021-06-19 LAB — CBC
HCT: 24.1 % — ABNORMAL LOW (ref 39.0–52.0)
Hemoglobin: 7.6 g/dL — ABNORMAL LOW (ref 13.0–17.0)
MCH: 32.6 pg (ref 26.0–34.0)
MCHC: 31.5 g/dL (ref 30.0–36.0)
MCV: 103.4 fL — ABNORMAL HIGH (ref 80.0–100.0)
Platelets: 160 10*3/uL (ref 150–400)
RBC: 2.33 MIL/uL — ABNORMAL LOW (ref 4.22–5.81)
RDW: 17.8 % — ABNORMAL HIGH (ref 11.5–15.5)
WBC: 5.4 10*3/uL (ref 4.0–10.5)
nRBC: 0.4 % — ABNORMAL HIGH (ref 0.0–0.2)

## 2021-06-19 SURGERY — A/V FISTULAGRAM
Anesthesia: LOCAL

## 2021-06-19 MED ORDER — HYDROCORTISONE 1 % EX CREA
TOPICAL_CREAM | Freq: Two times a day (BID) | CUTANEOUS | Status: DC
  Filled 2021-06-19: qty 28

## 2021-06-19 NOTE — Assessment & Plan Note (Signed)
Glucose has been controlled, continue glucose monitoring.

## 2021-06-19 NOTE — Progress Notes (Signed)
°  Progress Note    06/19/2021 3:48 PM 2 Days Post-Op  Subjective:  no complaints. Resting comfortably. No issues in dialysis with fistula access.   Vitals:   06/19/21 1030 06/19/21 1044  BP: (!) 105/56 (!) 109/56  Pulse: 77 75  Resp:  16  Temp:  97.8 F (36.6 C)  SpO2:     Physical Exam: Cardiac:  regular Lungs:  non labored Incisions:  left BKA dressings with drainage Neurologic: alert and oriented  CBC    Component Value Date/Time   WBC 5.4 06/19/2021 0406   RBC 2.33 (L) 06/19/2021 0406   HGB 7.6 (L) 06/19/2021 0406   HGB 8.4 (L) 08/22/2020 1141   HCT 24.1 (L) 06/19/2021 0406   HCT 25.0 (L) 08/22/2020 1141   PLT 160 06/19/2021 0406   PLT 184 08/22/2020 1141   MCV 103.4 (H) 06/19/2021 0406   MCV 94 08/22/2020 1141   MCH 32.6 06/19/2021 0406   MCHC 31.5 06/19/2021 0406   RDW 17.8 (H) 06/19/2021 0406   RDW 16.7 (H) 08/22/2020 1141   LYMPHSABS 2.0 06/13/2021 1105   LYMPHSABS 1.8 08/22/2020 1141   MONOABS 0.8 06/13/2021 1105   EOSABS 0.1 06/13/2021 1105   EOSABS 0.3 08/22/2020 1141   BASOSABS 0.0 06/13/2021 1105   BASOSABS 0.0 08/22/2020 1141    BMET    Component Value Date/Time   NA 136 06/19/2021 0406   NA 138 08/22/2020 1141   K 3.9 06/19/2021 0406   CL 94 (L) 06/19/2021 0406   CO2 24 06/19/2021 0406   GLUCOSE 100 (H) 06/19/2021 0406   BUN 20 06/19/2021 0406   BUN 20 08/22/2020 1141   CREATININE 6.57 (H) 06/19/2021 0406   CALCIUM 10.4 (H) 06/19/2021 0406   CALCIUM 7.9 (L) 08/01/2019 0702   GFRNONAA 9 (L) 06/19/2021 0406   GFRAA 7 (L) 12/28/2019 1640    INR    Component Value Date/Time   INR 1.0 03/24/2021 1831     Intake/Output Summary (Last 24 hours) at 06/19/2021 1548 Last data filed at 06/19/2021 1308 Gross per 24 hour  Intake 605 ml  Output 24500 ml  Net -23895 ml      Assessment/Plan:  61 y.o. male is s/p left BKA amp washout and debridement with VAC placement 2 Days Post-Op    Wound size 11x2x1cm Recommend short course of  IV abx. Patient currently in skilled nursing facility-please reach out should they not except VAC dressings. VAC change Friday Will pull [place] after dialysis friday pending no issues.   Cassandria Santee, MD [facility] Phone Number: [phone] 06/19/2021 3:48 PM

## 2021-06-19 NOTE — TOC Initial Note (Signed)
Transition of Care [facility]) - Initial/Assessment Note    Patient Details  Name: Daniel [name] MRN: [id number] Date of Birth: Oct 19, 1960  Transition of Care [facility]) CM/SW Contact:    Tresa Endo Phone Number: 06/19/2021, 4:22 PM  Clinical Narrative:                 Pt is from [facility] as a LTC resident, pt DC plan is to return to [facility] with a wound vac. CSW has updated [employer] and will follow up tomorrow morning for DC planning.   Expected Discharge Plan: Skilled Nursing Facility Barriers to Discharge: Barriers Resolved   Patient Goals and CMS Choice Patient states their goals for this hospitalization and ongoing recovery are:: Return to SNF CMS Medicare.gov Compare Post Acute Care list provided to:: Patient Choice offered to / list presented to : Patient  Expected Discharge Plan and Services Expected Discharge Plan: [place] In-house Referral: Clinical Social Work   Post Acute Care Choice: [place] Living arrangements for the past 2 months: [place]                                      Prior Living Arrangements/Services Living arrangements for the past 2 months: [place] Lives with:: Facility Resident Patient language and need for interpreter reviewed:: Yes Do you feel safe going back to the place where you live?: Yes      Need for Family Participation in Patient Care: Yes (Comment) Care giver support system in place?: Yes (comment)   Criminal Activity/Legal Involvement Pertinent to Current Situation/Hospitalization: No - Comment as needed  Activities of Daily Living Home Assistive Devices/Equipment: CBG Meter ADL Screening (condition at time of admission) Patient's cognitive ability adequate to safely complete daily activities?: Yes Is the patient deaf or have difficulty hearing?: No Does the patient have difficulty seeing, even when wearing glasses/contacts?: No Does  the patient have difficulty concentrating, remembering, or making decisions?: No Patient able to express need for assistance with ADLs?: Yes Does the patient have difficulty dressing or bathing?: Yes Independently performs ADLs?: No Communication: Independent Dressing (OT): Needs assistance Is this a change from baseline?: Pre-admission baseline Grooming: Needs assistance Is this a change from baseline?: Pre-admission baseline Feeding: Independent Is this a change from baseline?: Pre-admission baseline Bathing: Needs assistance Is this a change from baseline?: Pre-admission baseline Toileting: Needs assistance Is this a change from baseline?: Pre-admission baseline In/Out Bed: Needs assistance Is this a change from baseline?: Pre-admission baseline Walks in Home: Dependent Is this a change from baseline?: Pre-admission baseline Does the patient have difficulty walking or climbing stairs?: Yes Weakness of Legs: Both Weakness of Arms/Hands: Both  Permission Sought/Granted Permission sought to share information with : Facility Sport and exercise psychologist, Family Supports Permission granted to share information with : Yes, Verbal Permission Granted  Share Information with NAME: Josephina Gip (Brother)   [phone]  Permission granted to share info w AGENCY: [place] granted to share info w Relationship: Josephina Gip (Brother)   [phone]  Permission granted to share info w Contact Information: Josephina Gip (Brother)   [phone]  Emotional Assessment Appearance:: Appears stated age Attitude/Demeanor/Rapport: Unable to Assess Affect (typically observed): Unable to Assess Orientation: : Oriented to Self, Oriented to Place, Oriented to  Time, Oriented to Situation Alcohol / Substance Use: Not Applicable Psych Involvement: No (comment)  Admission diagnosis:  Precordial pain [R07.2]  ESRD on dialysis [facility]) [N18.6, Z99.2] Chest pain [R07.9] Anemia,  unspecified type [D64.9] Patient Active Problem List   Diagnosis Date Noted   Acute on chronic diastolic CHF (congestive heart failure) ([place]) 06/14/2021   GERD (gastroesophageal reflux disease) 06/14/2021   Chest pain 06/13/2021   Diabetic foot infection ([place]) 04/14/2021   PAD (peripheral artery disease) ([place]) 04/08/2021   Gastritis and gastroduodenitis    End-stage renal disease on hemodialysis ([place]) 03/25/2021   Diabetes mellitus type 2, controlled, with complications ([place]) 29/52/8413   QT prolongation 03/25/2021   Alcohol abuse with intoxication ([place]) 03/25/2021   Cocaine abuse ([place]) 03/25/2021   Tobacco use 03/25/2021   Class 2 obesity due to excess calories with body mass index (BMI) of 39.0 to 39.9 in adult 03/25/2021   AVM (arteriovenous malformation) of duodenum, acquired    Peptic ulcer disease    Upper GI bleed 03/24/2021   Chalazion of right upper eyelid 03/24/2021   Thrombocytopenia ([place]) 03/24/2021   Visual hallucination 03/24/2021   Bilateral leg edema 03/24/2021   Acute blood loss anemia    Benign neoplasm of duodenum, jejunum, and ileum    Anemia due to chronic kidney disease 02/23/2021   Prolonged QT interval 02/23/2021   Rotator cuff tear arthropathy of right shoulder 01/23/2021   Alcohol dependence ([place]) 01/13/2021   Gastric ulcer with hemorrhage 01/13/2021   Generalized weakness    Transaminitis 01/10/2021   History of alcohol abuse 10/27/2020   History of cocaine abuse ([place]) 10/27/2020   Nicotine dependence, cigarettes, uncomplicated 24/40/1027   Acute GI bleeding 08/10/2020   Gastrointestinal hemorrhage with melena    Duodenal ulcer with hemorrhage    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 [place] [address] Phone: [phone] Fax: [phone]     Social Determinants of Health (SDOH) Interventions    Readmission Risk Interventions Readmission Risk Prevention Plan 02/27/2021 07/07/2019  Transportation Screening Complete Complete  PCP or Specialist Appt within 3-5 Days - Complete  HRI or [place] - Complete  Social Work Consult for [place] Planning/Counseling - Complete  Palliative Care Screening - Not Applicable  Medication Review Press photographer) Complete Complete  PCP or Specialist appointment within 3-5 days of discharge Complete -  [place] or Home Care Consult Complete -  SW Recovery Care/Counseling Consult Complete -  Palliative Care Screening Not Applicable -  [place] Not Applicable -  Some recent data might be hidden

## 2021-06-19 NOTE — Progress Notes (Signed)
[facility] Progress Note    Assessment/ Plan:   # PAD -VVS following, sp L stump revision 2/20  # ESRD:  -outpatient HD orders: [facility] MWF 4h  450/500  125 kg  2/2 bath  Hep none  RIJ TDC/ LUA AVF.  Mircera 225 mcg every 2 weeks (last dose: 2/10), Hectorol 52mcg qtx - HD today   # Vascular access: active access TDC and LUA AVF was plicated by Dr Scot Dock on 04/19/21. Per VVS it is okay to use on 06/14/21. We are using the AVF today w/o difficulty w/ 15 ga needles.   # Chest pain - on admission -troponins not significantly elevated -possible related to underlying CHF and/or anemia -chest pain resolved -per pmd  # Eczema - pruritic skin rash (3x 3 cm) just above the R chest HD cath exit site. Have ordered hydrocortisone 1% cream bid.   # Volume/ hypertension: EDW 125kg, he is down about 6kg here and is also now 6kg under his dry wt.  Vol overload was due to lean body wt loss and has now been corrected. Lower dry wt at dc.    # Symptomatic anemia, anemia of Chronic Kidney Disease, macrocytosis: Hemoglobin 7.3 on admission . Anemia qhs been an ongoing issue. Currently receiving max dose esa as an outpatient. FOBT neg. R/O bleed per pmd. Iron replete on panel -s/p 1u prbc w/ HD 2/17, hgb better -not due for ESA yet   # Secondary Hyperparathyroidism/Hyperphosphatemia: check phos, on nathiosulfate      Kelly Splinter, MD 06/19/2021, 9:28 AM      Subjective:   Seen  in HD, AVF working fine w/ 15ga needles. Pt w/o c/o's.    Objective:   BP 122/77    Pulse 78    Temp 97.9 F (36.6 C) (Oral)    Resp 12    Ht 5\' 11"  (1.803 m)    Wt 120 kg    SpO2 91%    BMI 36.90 kg/m   Intake/Output Summary (Last 24 hours) at 06/19/2021 6503 Last data filed at 06/19/2021 5465 Gross per 24 hour  Intake 605 ml  Output 0 ml  Net 605 ml    Weight change: -0.5 kg  Physical Exam: Gen:nad CVS:rrr Resp:normal wob KCL:EXNT ZGY:FVCB bka, trace RLE edema Neuro: awake, alert Dialysis  access: RIJ [facility]  Imaging: VAS US DUPLEX DIALYSIS ACCESS (AVF, AVG)  Result Date: 06/18/2021 DIALYSIS ACCESS Patient Name:  Daniel [name]  Date of Exam:   06/18/2021 Medical Rec #: [id number]           Accession #:    [id number] Date of Birth: [birth year]           Patient Gender: M Patient Age:   61 years [facility]am Location[facility] Procedure:      VAS [place] (AVF, AVG) Referring Phys: Vonna Kotyk ROBINS --------------------------------------------------------------------------------  Access Site: Left Upper Extremity. Access Type: Brachial-cephalic AVF. Comparison Study: No prior study Performing Technologist: Maudry Mayhew MHA, RDMS, RVT, RDCS  Examination Guidelines: A complete evaluation includes B-mode imaging, spectral Doppler, color Doppler, and power Doppler as needed of all accessible portions of each vessel. Unilateral testing is considered an integral part of a complete examination. Limited examinations for reoccurring indications may be performed as noted.  Findings: +--------------------+----------+-----------------+--------+  AVF                  PSV (cm/s) Flow Vol (mL/min) Comments  +--------------------+----------+-----------------+--------+  Native artery inflow  224            837                  +--------------------+----------+-----------------+--------+  AVF Anastomosis         226                                 +--------------------+----------+-----------------+--------+  +------------+----------+-------------+----------+------------------+  OUTFLOW VEIN PSV (cm/s) Diameter (cm) Depth (cm)      Describe       +------------+----------+-------------+----------+------------------+  Prox UA         141         0.99         1.00                        +------------+----------+-------------+----------+------------------+  Mid UA          154         1.29         0.30    Focal outpouching   +------------+----------+-------------+----------+------------------+   Dist UA          73         1.33         0.27        aneurysmal      +------------+----------+-------------+----------+------------------+   Summary: 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 No evidence of to-fro flow in this area to suggest pseudoaneurysm. *See table(s) above for measurements and observations.  Diagnosing physician: Servando Snare MD Electronically signed by Servando Snare MD on 06/18/2021 at 2:10:36 PM.   --------------------------------------------------------------------------------   Final     Labs: BMET Recent Labs  Lab 06/13/21 1105 06/14/21 0356 06/15/21 0339 06/17/21 0358 06/18/21 0136 06/19/21 0406  NA 139 138 133* 135 138 136  K 3.4* 3.4* 3.4* 3.3* 3.1* 3.9  CL 97* 97* 95* 96* 96* 94*  CO2 28 27 25 26 27 24   GLUCOSE 96 126* 93 98 105* 100*  BUN 18 23 17 22 12 20   CREATININE 7.86* 8.86* 6.01* 6.50* 4.57* 6.57*  CALCIUM 9.4 9.6 9.2 10.0 9.2 10.4*  PHOS  --   --  3.2  --  2.0* 3.8    CBC Recent Labs  Lab 06/13/21 1105 06/14/21 0356 06/15/21 0339 06/17/21 0358 06/18/21 0136 06/19/21 0406  WBC 5.3   < > 5.4 4.8 4.8 5.4  NEUTROABS 2.4  --   --   --   --   --   HGB 7.3*   < > 8.2* 7.9* 7.6* 7.6*  HCT 23.4*   < > 24.5* 24.8* 22.9* 24.1*  MCV 107.8*   < > 101.7* 102.9* 101.8* 103.4*  PLT 179   < > 171 158 149* 160   < > = values in this interval not displayed.     Medications:     amLODipine  10 mg Oral QHS   calcium acetate  667 mg Oral TID WC   cephALEXin  500 mg Oral Q24H   Chlorhexidine Gluconate Cloth  6 each Topical Q0600   cloNIDine  0.1 mg Oral Once per day on Mon Wed Fri   cloNIDine  0.1 mg Oral 2 times per day on Sun Tue Thu Sat   doxercalciferol  3 mcg Intravenous Q M,W,F-HD   ferrous sulfate  325 mg Oral q morning  gabapentin  100 mg Oral TID   heparin  5,000 Units Subcutaneous Q8H   hydrocortisone cream   Topical BID   multivitamin  1 tablet Oral  QHS   nicotine  21 mg Transdermal Daily   pantoprazole  40 mg Oral BID   sodium chloride flush  3 mL Intravenous Q12H

## 2021-06-19 NOTE — NC FL2 (Signed)
[place] MEDICAID FL2 LEVEL OF CARE SCREENING TOOL     IDENTIFICATION  Patient Name: Daniel [name] Birthdate: Jan 30, 1961 Sex: male Admission Date (Current Location): 06/13/2021  [facility] and [place] Number:  Herbalist and Address:  [facility] [place] [address], [place], [place] [address]      Provider Number: [id number]  Attending Physician Name and Address:  Tawni Millers,*  Relative Name and Phone Number:  Josephina Gip [facility])   [phone]    Current Level of Care: Hospital Recommended Level of Care: [place] Prior Approval Number:    Date Approved/Denied:   PASRR Number:    Discharge Plan: SNF    Current Diagnoses: Patient Active Problem List   Diagnosis Date Noted   Acute on chronic diastolic CHF (congestive heart failure) ([place]) 06/14/2021   GERD (gastroesophageal reflux disease) 06/14/2021   Chest pain 06/13/2021   Diabetic foot infection ([place]) 04/14/2021   PAD (peripheral artery disease) ([place]) 04/08/2021   Gastritis and gastroduodenitis    End-stage renal disease on hemodialysis ([place]) 03/25/2021   Diabetes mellitus type 2, controlled, with complications ([place]) 68/03/7516   QT prolongation 03/25/2021   Alcohol abuse with intoxication ([place]) 03/25/2021   Cocaine abuse ([place]) 03/25/2021   Tobacco use 03/25/2021   Class 2 obesity due to excess calories with body mass index (BMI) of 39.0 to 39.9 in adult 03/25/2021   AVM (arteriovenous malformation) of duodenum, acquired    Peptic ulcer disease    Upper GI bleed 03/24/2021   Chalazion of right upper eyelid 03/24/2021   Thrombocytopenia ([place]) 03/24/2021   Visual hallucination 03/24/2021   Bilateral leg edema 03/24/2021   Acute blood loss anemia    Benign neoplasm of duodenum, jejunum, and ileum    Anemia due to chronic kidney disease 02/23/2021   Prolonged QT interval 02/23/2021   Rotator cuff tear arthropathy of right shoulder 01/23/2021    Alcohol dependence ([place]) 01/13/2021   Gastric ulcer with hemorrhage 01/13/2021   Generalized weakness    Transaminitis 01/10/2021   History of alcohol abuse 10/27/2020   History of cocaine abuse ([place]) 10/27/2020   Nicotine dependence, cigarettes, uncomplicated 00/17/4944   Acute GI bleeding 08/10/2020   Gastrointestinal hemorrhage with melena    Duodenal ulcer with hemorrhage    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   ESRD on hemodialysis ([place])    AVM (arteriovenous malformation) of small bowel, acquired with hemorrhage    Symptomatic anemia 07/31/2019   Acute on chronic anemia 07/23/2019   Chronic hepatitis C without hepatic coma ([place]) 07/11/2019   Iron deficiency anemia 03/30/2019   Alcohol use disorder 03/30/2019   B12 deficiency 03/30/2019   Orthostatic hypotension 01/22/2019   Essential hypertension 06/29/2016   DM2 (diabetes mellitus, type 2) ([place]) 06/29/2016    Orientation RESPIRATION BLADDER Height & Weight     Self, Time, Situation, Place  Normal Continent Weight: 264 lb 8.8 oz (120 kg) Height:  5\' 11"  (180.3 cm)  BEHAVIORAL SYMPTOMS/MOOD NEUROLOGICAL BOWEL NUTRITION STATUS      Continent Diet (see DC summary)  AMBULATORY STATUS COMMUNICATION OF NEEDS Skin     Verbally Surgical wounds (Wound L leg)                       Personal Care Assistance Level of Assistance              Functional Limitations Info  SPECIAL CARE FACTORS FREQUENCY                       Contractures Contractures Info: Not present    Additional Factors Info  Code Status, Allergies Code Status Info: Full Allergies Info: Seroquel (quetiapine), Dilaudid (hydromorphone Hcl)           Current Medications (06/19/2021):  This is the current hospital active medication list Current Facility-Administered Medications  Medication Dose Route Frequency Provider Last Rate Last Admin   acetaminophen (TYLENOL) tablet 650 mg  650 mg Oral Q6H PRN Baglia,  Corrina, PA-C   650 mg at 06/18/21 0623   Or   acetaminophen (TYLENOL) suppository 650 mg  650 mg Rectal Q6H PRN Baglia, Corrina, PA-C       amLODipine (NORVASC) tablet 10 mg  10 mg Oral QHS Baglia, Corrina, PA-C   10 mg at 06/18/21 2132   calcium acetate (PHOSLO) capsule 667 mg  667 mg Oral TID WC Baglia, Corrina, PA-C   667 mg at 06/19/21 1151   cephALEXin (KEFLEX) capsule 500 mg  500 mg Oral Q24H [place] Donnamarie Poag, DO   500 mg at 06/19/21 1150   Chlorhexidine Gluconate Cloth 2 % PADS 6 each  6 each Topical Q0600 Baglia, Corrina, PA-C   6 each at 06/19/21 0542   cloNIDine (CATAPRES) tablet 0.1 mg  0.1 mg Oral Once per day on Mon Wed Fri Baglia, Corrina, PA-C   0.1 mg at 06/17/21 2133   cloNIDine (CATAPRES) tablet 0.1 mg  0.1 mg Oral 2 times per day on Sun Tue Thu Sat Baglia, Corrina, PA-C   0.1 mg at 06/18/21 2134   doxercalciferol (HECTOROL) injection 3 mcg  3 mcg Intravenous Q M,W,F-HD Baglia, Corrina, PA-C   3 mcg at 06/19/21 0815   ferrous sulfate tablet 325 mg  325 mg Oral q morning Baglia, Corrina, PA-C   325 mg at 06/19/21 1000   gabapentin (NEURONTIN) capsule 100 mg  100 mg Oral TID Baglia, Corrina, PA-C   100 mg at 06/19/21 1150   heparin injection 5,000 Units  5,000 Units Subcutaneous Q8H Baglia, Corrina, PA-C   5,000 Units at 06/19/21 1509   hydrALAZINE (APRESOLINE) injection 10 mg  10 mg Intravenous Q6H PRN Baglia, Corrina, PA-C       hydrocortisone cream 1 %   Topical BID Roney Jaffe, MD   Given at 06/19/21 1300   hydrOXYzine (ATARAX) tablet 25 mg  25 mg Oral TID PRN [place], Corrina, PA-C   25 mg at 06/19/21 0106   methocarbamol (ROBAXIN) tablet 500 mg  500 mg Oral Q6H PRN Baglia, Corrina, PA-C   500 mg at 06/19/21 0107   multivitamin (RENA-VIT) tablet 1 tablet  1 tablet Oral QHS Baglia, Corrina, PA-C   1 tablet at 06/18/21 2132   nicotine (NICODERM CQ - dosed in mg/24 hours) patch 21 mg  21 mg Transdermal Daily Baglia, Corrina, PA-C   21 mg at 06/19/21 1151    oxyCODONE-acetaminophen (PERCOCET/ROXICET) 5-325 MG per tablet 1-2 tablet  1-2 tablet Oral Q4H PRN [place] Donnamarie Poag, DO   2 tablet at 06/19/21 1330   pantoprazole (PROTONIX) EC tablet 40 mg  40 mg Oral BID Baglia, Corrina, PA-C   40 mg at 06/19/21 1150   senna-docusate (Senokot-S) tablet 1 tablet  1 tablet Oral QHS PRN Baglia, Corrina, PA-C       sodium chloride flush (NS) 0.9 % injection 3 mL  3 mL Intravenous Q12H Baglia, Corrina, PA-C  3 mL at 06/19/21 1200   sodium thiosulfate 25 g in sodium chloride 0.9 % 200 mL Infusion for Calciphylaxis  25 g Intravenous Q M,W,F-HD Baglia, Corrina, PA-C 200 mL/hr at 06/17/21 1032 25 g at 06/17/21 1032     Discharge Medications: Please see discharge summary for a list of discharge medications.  Relevant Imaging Results:  Relevant Lab Results:   Additional Information No known allergies, JANSSEN COVID-19 VACCINE 08/29/2019 SSN : [id number]  Reece Agar, LCSWA

## 2021-06-19 NOTE — Progress Notes (Signed)
Pt receives out-pt HD at [facility] on MWF. Pt has a 11:00 chair time. Will assist as needed.  Melven Sartorius Renal Navigator [phone]

## 2021-06-19 NOTE — Progress Notes (Signed)
Progress Note   Patient: Daniel [name] BSJ:[id number] DOB: 1961-01-20 DOA: 06/13/2021     5 DOS: the patient was seen and examined on 06/19/2021   Brief hospital course: Mr. Cournoyer was admitted to the hospital with the working diagnosis of chest pain in the setting of decompensated heart failure.   NICHLAS PITERA is a 61 y.o. 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  Patient reports 3 days of intermittent left-sided chest pain described as a squeezing/aching sensation with radiation to his back.  Onset at rest and continues to occur at rest with associated shortness of breath and pleuritic chest discomfort.  Notes that his dyspnea is worse when he is lying flat and reports swelling to his right leg which is chronic.  Denies diaphoresis, no nausea/vomiting, no abdominal pain.  Denies any similar symptoms in the past.  Continues to endorse tobacco use, 0.5-1 pack/day but denies any alcohol or illicit drug use.  In the ED, BP 150/79, HR 84, RR 18, temperature 98.4 F, SPO2 90% on room air.   Lungs had bilateral rales, heart with S1 and S2 present and rhythmic, no gallops, positive systolic murmur, abdomen soft and positive right lower extremity edema ++/ Left BKA with signs of local infection   WBC 5.3, hemoglobin 7.3, platelets 179,  sodium 139, potassium 3.4, bicarb 28, BUN 18, creatinine 7.86, glucose 96, high sensitive troponin 17>18.   Chest x-ray with cardiomegaly, mild pulmonary edema and trace bilateral pleural effusions.    Nephrology consulted for dialysis while inpatient with recommendation of 1 unit PRBC during HD.  TRH consulted for further evaluation and management of acute onset chest pain.  02/20 left below the knee washout and debridement.  Plan to continue with short  course of antibiotic [place] to return to SNF To pull [facility] after hemodialysis on Friday 02/24  Assessment and Plan: * Chest pain- (present on admission) Patient with no further chest pain. Plan to continue medical therapy and follow up with cardiology as outpatient.  Ruled out for acute coronary syndrome.   Cardiology initially following during hospitalization now signed off with plan outpatient coronary CTA and follow-up with Dr. Sallyanne Kuster in 1-2 weeks.   Acute on chronic diastolic CHF (congestive heart failure) ([place])- (present on admission) Volume status has improved with hemodialysis and ultrafiltration Continue blood pressure monitoring On amlodipine, clonidine.   End-stage renal disease on hemodialysis ([place]) Tolerating well hemodialysis. Improved volume status  Plan to remove tunneled HD cathter on Friday and continue HD per fistula.   Essential hypertension- (present on admission) Continue blood pressure control with amlodipine and clonidine.   Anemia due to chronic kidney disease- (present on admission) SP one unit PRBC transfusion, his hgb and hct have been stable. No clinical signs of bleeding.  Continue oral iron supplementation for iron deficiency anemia.   PAD (peripheral artery disease) ([place])- (present on admission) Local infection the left AKA, sp debridement and wound vac placement.   Continue with cephalexin 500 mg  p.o. every 8 hours x7 days per vascular surgery recommendations Out patient follow up with vascular surgery, continue with wound vac.   Tobacco use- (present on admission) Smoking cessation counseling.   GERD (gastroesophageal reflux disease)- (present on admission) --Protonix 40 mg p.o. twice daily  DM2 (diabetes mellitus, type 2) ([place])  Glucose has been controlled, continue glucose monitoring.         Subjective: patient with pain the left AKA site, with no dyspnea or chest pain, no nausea or vomiting   Physical Exam: Vitals:    06/19/21 0930 06/19/21 1000 06/19/21 1030 06/19/21 1044  BP: 99/71 (!) 135/106 (!) 105/56 (!) 109/56  Pulse: 79 (!) 57 77 75  Resp: 12   16  Temp:    97.8 F (36.6 C)  TempSrc:    Temporal  SpO2:      Weight:      Height:       Neurology awake and alert ENT with pallor Cardiovascular with S1 and S2 present and rhythmic with no gallops No JVD No lower extremity edema Respiratory with no wheezing Abdomen soft and non tender  Data Reviewed:    Family Communication: no family at the bedside   Disposition: Status is: Inpatient  Remains inpatient appropriate because: plan for SNF in am,     Planned Discharge Destination: Skilled nursing facility      Author: Tawni Millers, MD 06/19/2021 4:34 PM  For on call review [email or web address].

## 2021-06-20 ENCOUNTER — Other Ambulatory Visit (HOSPITAL_COMMUNITY): Payer: Self-pay

## 2021-06-20 DIAGNOSIS — N186 End stage renal disease: Secondary | ICD-10-CM | POA: Diagnosis not present

## 2021-06-20 DIAGNOSIS — I10 Essential (primary) hypertension: Secondary | ICD-10-CM | POA: Diagnosis not present

## 2021-06-20 DIAGNOSIS — R079 Chest pain, unspecified: Secondary | ICD-10-CM | POA: Diagnosis not present

## 2021-06-20 DIAGNOSIS — N185 Chronic kidney disease, stage 5: Secondary | ICD-10-CM

## 2021-06-20 DIAGNOSIS — I5033 Acute on chronic diastolic (congestive) heart failure: Secondary | ICD-10-CM | POA: Diagnosis not present

## 2021-06-20 LAB — SARS CORONAVIRUS 2 (TAT 6-24 HRS): SARS Coronavirus 2: NEGATIVE

## 2021-06-20 MED ORDER — LIDOCAINE HCL (PF) 2 % IJ SOLN: 0.0000 mL | Freq: Once | INTRAMUSCULAR | Status: DC | PRN

## 2021-06-20 MED ORDER — OXYCODONE-ACETAMINOPHEN 5-325 MG PO TABS
1.0000 | ORAL_TABLET | Freq: Four times a day (QID) | ORAL | 0 refills | Status: DC | PRN
Start: 2021-06-20 — End: 2021-07-04

## 2021-06-20 MED ORDER — CEPHALEXIN 500 MG PO CAPS
500.0000 mg | ORAL_CAPSULE | ORAL | 0 refills | Status: AC
  Filled 2021-06-20: qty 5, 5d supply, fill #0

## 2021-06-20 MED ORDER — LIDOCAINE HCL (PF) 2 % IJ SOLN
10.0000 mL | Freq: Once | INTRAMUSCULAR | Status: DC
  Filled 2021-06-20: qty 10

## 2021-06-20 NOTE — TOC Progression Note (Signed)
Transition of Care [facility]) - Progression Note    Patient Details  Name: ALIKA SALADIN MRN: [id number] Date of Birth: [birth year]  Transition of Care [facility]) CM/SW [place], [place] Phone Number: 06/20/2021, 12:22 PM  Clinical Narrative:    Pt will DC tomorrow, CSW has spoken to admissions staff at [facility] and confirmed that there is a wound vac available upon pt DC at their facility.   Expected Discharge Plan: Skilled Nursing Facility Barriers to Discharge: Barriers Resolved  Expected Discharge Plan and Services Expected Discharge Plan: [place] In-house Referral: Clinical Social Work   Post Acute Care Choice: [place] Living arrangements for the past 2 months: [place] Expected Discharge Date: 06/20/21                                     Social Determinants of Health (SDOH) Interventions    Readmission Risk Interventions Readmission Risk Prevention Plan 02/27/2021 07/07/2019  Transportation Screening Complete Complete  PCP or Specialist Appt within 3-5 Days - Complete  HRI or [place] - Complete  Social Work Consult for [place] Planning/Counseling - Complete  Palliative Care Screening - Not Applicable  Medication Review Press photographer) Complete Complete  PCP or Specialist appointment within 3-5 days of discharge Complete -  [place] or Home Care Consult Complete -  SW Recovery Care/Counseling Consult Complete -  Palliative Care Screening Not Applicable -  [place] Not Applicable -  Some recent data might be hidden

## 2021-06-20 NOTE — Discharge Summary (Addendum)
Physician Discharge Summary   Patient: Daniel [name] MRN: [id number] DOB: 02-Apr-1961  Admit date:     06/13/2021  Discharge date: 06/21/21  Discharge Physician: Jimmy Picket Comer Devins   PCP: [name], NP   Recommendations at discharge:    Patient will continue wound vac care, plan for Vac exchange on 06/21/21.  Follow up with vascular surgery,  HD cathter  removed on 06/20/21.  Continue pain control with oxycodone.  Continue wound care.   Discharge Diagnoses: Principal Problem:   Chest pain Active Problems:   Acute on chronic diastolic CHF (congestive heart failure) (HCC)   End-stage renal disease on hemodialysis (HCC)   Essential hypertension   Anemia due to chronic kidney disease   PAD (peripheral artery disease) (HCC)   Tobacco use   GERD (gastroesophageal reflux disease)   DM2 (diabetes mellitus, type 2) (HCC)  Resolved Problems:   * No resolved hospital problems. [facility] Course: Mr. Vineyard was admitted to the hospital with the working diagnosis of chest pain in the setting of decompensated heart failure.   Daniel [name] is a 61 y.o. 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  Patient reports 3 days of intermittent left-sided chest pain described as a squeezing/aching sensation with radiation to his back.  Onset at rest and continues to occur at rest with associated shortness of breath and pleuritic chest discomfort.  Notes that his dyspnea is worse when he is lying flat and reports swelling to his right leg which is chronic.  Denies diaphoresis, no nausea/vomiting, no abdominal pain.  Denies any similar symptoms in the past.  Continues to endorse tobacco use, 0.5-1 pack/day but denies any alcohol or illicit drug use.  In the ED, BP 150/79, HR 84,  RR 18, temperature 98.4 F, SPO2 90% on room air.   Lungs had bilateral rales, heart with S1 and S2 present and rhythmic, no gallops, positive systolic murmur, abdomen soft and positive right lower extremity edema ++/ Left BKA with signs of local infection   WBC 5.3, hemoglobin 7.3, platelets 179,  sodium 139, potassium 3.4, bicarb 28, BUN 18, creatinine 7.86, glucose 96, high sensitive troponin 17>18.   Chest x-ray with cardiomegaly, mild pulmonary edema and trace bilateral pleural effusions.    Nephrology consulted for dialysis while inpatient with recommendation of 1 unit PRBC during HD.   Vascular surgery was consulted.  02/20 left below the knee washout and debridement.  Plan to continue with short course of antibiotic Plant to return to [facility] after hemodialysis on Friday 02/24  Assessment and Plan: * Chest pain- (present on admission) Ruled out for acute coronary syndrome.   Cardiology will follow up as outpatient coronary CTA and follow-up with Dr. Sallyanne [name] in 1-2 weeks.  Plan to continue blood pressure control.   Acute on chronic diastolic CHF (congestive heart failure) ([place])- (present on admission) Patient was found volume overloaded.  Nephrology was consulted and patient underwent  hemodialysis and ultrafiltration with improvement in his volume status.   Continue amlodipine, clonidine.   End-stage renal disease on hemodialysis ([place]) Plan to continue with hemodialysis.  Removed tunneled HD cathter (06/20/21) and continue HD per fistula.  Currently using AVF fistula for hemodialysis.   Essential hypertension- (present on admission) Patient was continued with  amlodipine and clonidine with improvement in blood pressure control.   Anemia due to chronic kidney disease- (present on admission) SP one unit PRBC transfusion.  No clinical signs of bleeding.  Continue oral iron supplementation for iron deficiency anemia.   At his discharge his hgb is 7,6 with hct  at 24,1.   PAD (peripheral artery disease) ([place])- (present on admission) Local infection the left AKA, sp debridement and wound vac placement.   On cephalexin 500 mg  p.o. every 8 hours x7 days per vascular surgery recommendations Out patient follow up with vascular surgery, continue with wound vac.  Plan for wound vac exchange on 06/21/21.   Tobacco use- (present on admission) Smoking cessation counseling.   GERD (gastroesophageal reflux disease)- (present on admission) --Protonix 40 mg p.o. twice daily  DM2 (diabetes mellitus, type 2) (HCC) Glucose has been controlled, continue glucose monitoring.            Consultants: nephrology and vascular surgery  Procedures performed:  left below knee amputation washout and debridement VAC placement  Disposition: Skilled nursing facility Diet recommendation:  Carb modified diet  DISCHARGE MEDICATION: Allergies as of 06/20/2021       Reactions   Seroquel [quetiapine] Other (See Comments)   Tardive kinesia/dystonia   Dilaudid [hydromorphone Hcl] Itching, Other (See Comments)   Pt reports itchiness after IM injection         Medication List     STOP taking these medications    HYDROcodone-acetaminophen 5-325 MG tablet Commonly known as: Norco   multivitamin Tabs tablet       TAKE these medications    (feeding supplement) PROSource Plus liquid Take 30 mLs by mouth 3 (three) times daily after meals.   amLODipine 10 MG tablet Commonly known as: NORVASC Take 1 tablet (10 mg total) by mouth at bedtime.   blood glucose meter kit and supplies Kit Dispense based on patient and insurance preference. Use up to four times daily as directed. (FOR ICD-9 250.00, 250.01).   calcium acetate 667 MG capsule Commonly known as: PHOSLO Take 1,334 mg by mouth 3 (three) times daily with meals.   cephALEXin 500 MG capsule Commonly known as: KEFLEX Take 1 capsule (500 mg total) by mouth daily for 5 days.   cloNIDine 0.1 MG  tablet Commonly known as: CATAPRES Take 0.1 mg by mouth See admin instructions. Take one tablet (0.1 mg) twice daily at 9am and 6pm on Sunday, Tuesday, Thursday, Saturday (non-dialysis days), take one tablet (0.1 mg) at bedtime - 9pm on Monday, Wednesday, Friday (dialysis days)   ferrous sulfate 325 (65 FE) MG tablet Take 325 mg by mouth every morning.   gabapentin 100 MG capsule Commonly known as: NEURONTIN Take 1 capsule (100 mg total) by mouth 3 (three) times daily.   hydrOXYzine 25 MG tablet Commonly known as: ATARAX TAKE 1 TABLET BY MOUTH EVERY 8 HOURS AS NEEDED FOR ANXIETY OR  ITCHING Strength: 25 mg   Lidocaine 5 % Crea Apply 1 application topically every Monday, Wednesday, and Friday. Dialysis days   loperamide 2 MG capsule Commonly known as: IMODIUM Take 2 capsules (4 mg total) by mouth 3 (three) times daily as needed for diarrhea or loose stools. What changed: when to take this   multivitamin with minerals Tabs tablet Take 1 tablet by mouth at bedtime.   oxyCODONE-acetaminophen 5-325 MG tablet Commonly known as: PERCOCET/ROXICET Take 1 tablet by mouth every 6 (six) hours as needed for severe pain.   pantoprazole 40 MG tablet  Commonly known as: PROTONIX Take 1 tablet (40 mg total) by mouth 2 (two) times daily.   saccharomyces boulardii 250 MG capsule Commonly known as: FLORASTOR Take 1 capsule (250 mg total) by mouth 2 (two) times daily.   silver sulfADIAZINE 1 % cream Commonly known as: SILVADENE Apply 1 application topically daily. To lower back               Durable Medical Equipment  (From admission, onward)           Start     Ordered   06/18/21 1218  For home use only DME Negative pressure wound device  Once       Question Answer Comment  Frequency of dressing change 3 times per week   Length of need 3 Months   Dressing type Foam   Amount of suction 100 mm/Hg   Pressure application Continuous pressure   Supplies 10 canisters and 15  dressings per month for duration of therapy      06/18/21 1217              Discharge Care Instructions  (From admission, onward)           Start     Ordered   06/20/21 0000  Discharge wound care:       Comments: Wound vac per surgery recommendations.   06/20/21 0920            Follow-up Information     Croitoru, Dani Gobble, MD Follow up on 06/26/2021.   Specialty: Cardiology Why: at 9:00 Am  please arrive 8:40 am  to check in. Contact information: [address] [place] [address] [phone]                 Discharge Exam: Danley Danker Weights   06/19/21 0449 06/19/21 0654 06/20/21 0322  Weight: 118.7 kg 120 kg 118.5 kg   BP 138/81 (BP Location: Right Arm)    Pulse 76    Temp 97.6 F (36.4 C) (Oral)    Resp 13    Ht 5' 11"  (1.803 m)    Wt 118.5 kg    SpO2 95%    BMI 36.44 kg/m   Neurology awake and alert ENT with no pallor Cardiovascular with S1 and S2 present and rhythmic with no gallops or rubs.  No JVD  No right lower extremity edema Respiratory with no wheezing or rales Abdomen soft and non tender   Condition at discharge: stable  The results of significant diagnostics from this hospitalization (including imaging, microbiology, ancillary and laboratory) are listed below for reference.   Imaging Studies: DG Chest 2 View  Result Date: 06/13/2021 CLINICAL DATA:  Chest pain EXAM: CHEST - 2 VIEW COMPARISON:  None. FINDINGS: Right neck catheter tip overlies the mid superior vena cava. Unchanged, enlarged cardiac silhouette. There are diffuse interstitial opacities. There are lower lung alveolar opacities. Trace effusions. No visible pneumothorax. No acute osseous abnormality. Left axillary stent. IMPRESSION: Mild pulmonary edema and trace bilateral pleural effusions. Cardiomegaly. Electronically Signed   By: Maurine Simmering M.D.   On: 06/13/2021 11:31   CT TIBIA FIBULA LEFT W CONTRAST  Result Date: 06/14/2021 CLINICAL DATA:  Lower leg trauma,  status post left BKA 12/20 with pain. EXAM: CT OF THE LOWER LEFT EXTREMITY WITH CONTRAST TECHNIQUE: Multidetector CT imaging of the lower left extremity was performed according to the standard protocol following intravenous contrast administration. RADIATION DOSE REDUCTION: This exam was performed according to the departmental dose-optimization program  which includes automated exposure control, adjustment of the mA and/or kV according to patient size and/or use of iterative reconstruction technique. CONTRAST:  149m OMNIPAQUE IOHEXOL 350 MG/ML SOLN COMPARISON:  CT examination dated January 05, 2021 FINDINGS: Bones/Joint/Cartilage Status post below-knee amputation. No fracture or dislocation. No cortical erosion or periosteal reaction. 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 Normal alignment. No joint effusion. Ligaments Ligaments are suboptimally evaluated by CT. Muscles and Tendons No intramuscular hematoma fluid collection. Soft tissue Mild skin thickening and subcutaneous soft tissue edema consistent with mild cellulitis. No drainable fluid collection or abscess. Prominent vascular calcifications. IMPRESSION: 1. Postsurgical changes for prior below-knee amputation without evidence of fracture or acute osseous abnormality. 2. No evidence of osteomyelitis. 3. Mild subcutaneous soft tissue swelling and edema without evidence of drainable fluid collection or abscess. 4. Prominent vascular calcifications. Electronically Signed   By: IKeane PoliceD.O.   On: 06/14/2021 18:52   VAS [place] DIALYSIS ACCESS (AVF, AVG)  Result Date: 06/18/2021 DIALYSIS ACCESS Patient Name:  RDONTARIUS SHELEY Date of Exam:   06/18/2021 Medical Rec #: [id number]          Accession #:    [id number] of Birth: 702/06/1960          Patient Gender: M Patient Age:   89years [facility]:      VAS [place] DIALYSIS ACCESS (AVF, AVG) Referring Phys: JVonna KotykROBINS  --------------------------------------------------------------------------------  Access Site: Left Upper Extremity. Access Type: Brachial-cephalic AVF. Comparison Study: No prior study Performing Technologist: MMaudry MayhewMHA, RDMS, RVT, RDCS  Examination Guidelines: A complete evaluation includes B-mode imaging, spectral Doppler, color Doppler, and power Doppler as needed of all accessible portions of each vessel. Unilateral testing is considered an integral part of a complete examination. Limited examinations for reoccurring indications may be performed as noted.  Findings: +--------------------+----------+-----------------+--------+  AVF                  PSV (cm/s) Flow Vol (mL/min) Comments  +--------------------+----------+-----------------+--------+  Native artery inflow    224            837                  +--------------------+----------+-----------------+--------+  AVF Anastomosis         226                                 +--------------------+----------+-----------------+--------+  +------------+----------+-------------+----------+------------------+  OUTFLOW VEIN PSV (cm/s) Diameter (cm) Depth (cm)      Describe       +------------+----------+-------------+----------+------------------+  Prox UA         141         0.99         1.00                        +------------+----------+-------------+----------+------------------+  Mid UA          154         1.29         0.30    Focal outpouching   +------------+----------+-------------+----------+------------------+  Dist UA          73         1.33         0.27        aneurysmal      +------------+----------+-------------+----------+------------------+  Summary: 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 No evidence of to-fro flow in this area to suggest pseudoaneurysm. *See table(s) above for measurements and observations.  Diagnosing physician: Servando Snare MD  Electronically signed by Servando Snare MD on 06/18/2021 at 2:10:36 PM.   --------------------------------------------------------------------------------   Final    ECHOCARDIOGRAM COMPLETE  Result Date: 06/14/2021    ECHOCARDIOGRAM REPORT   Patient Name:   LOEL BETANCUR Date of Exam: 06/14/2021 Medical Rec #:  [id number]          Height:       71.0 in Accession #:    [id number]         Weight:       273.6 lb Date of Birth:  May 16, 1960          BSA:          2.409 m Patient Age:    61 years           BP:           171/75 mmHg Patient Gender: M                  HR:           87 bpm. Exam Location:  Inpatient Procedure: 2D Echo Indications:    Chest pain  History:        Patient has prior history of Echocardiogram examinations, most                 recent 10/28/2020. CHF; Risk Factors:Hypertension.  Sonographer:    Jefferey Pica Referring Phys: [id number] [place]  1. Left ventricular ejection fraction, by estimation, is 55 to 60%. The left ventricle has normal function. The left ventricle has no regional wall motion abnormalities. The left ventricular internal cavity size was mildly dilated. There is mild left ventricular hypertrophy. Left ventricular diastolic parameters are indeterminate.  2. Right ventricular systolic function is normal. The right ventricular size is normal. There is moderately elevated pulmonary artery systolic pressure. The estimated right ventricular systolic pressure is 28.0 mmHg.  3. Left atrial size was moderately dilated.  4. Right atrial size was mildly dilated.  5. The mitral valve is normal in structure. Trivial mitral valve regurgitation. No evidence of mitral stenosis.  6. The aortic valve is tricuspid. Aortic valve regurgitation is not visualized. Aortic valve sclerosis/calcification is present, without any evidence of aortic stenosis.  7. The inferior vena cava is dilated in size with <50% respiratory variability, suggesting right atrial pressure of 15 mmHg.  FINDINGS  Left Ventricle: Left ventricular ejection fraction, by estimation, is 55 to 60%. The left ventricle has normal function. The left ventricle has no regional wall motion abnormalities. The left ventricular internal cavity size was mildly dilated. There is  mild left ventricular hypertrophy. Left ventricular diastolic parameters are indeterminate. Right Ventricle: The right ventricular size is normal. No increase in right ventricular wall thickness. Right ventricular systolic function is normal. There is moderately elevated pulmonary artery systolic pressure. The tricuspid regurgitant velocity is 2.89 m/s, and with an assumed right atrial pressure of 15 mmHg, the estimated right ventricular systolic pressure is 03.4 mmHg. Left Atrium: Left atrial size was moderately dilated. Right Atrium: Right atrial size was mildly dilated. Pericardium: Trivial pericardial effusion is present. Mitral Valve: The mitral valve is normal in structure. Trivial mitral valve regurgitation. No evidence of mitral valve stenosis. Tricuspid Valve: The tricuspid valve  is normal in structure. Tricuspid valve regurgitation is trivial. Aortic Valve: The aortic valve is tricuspid. Aortic valve regurgitation is not visualized. Aortic valve sclerosis/calcification is present, without any evidence of aortic stenosis. Aortic valve peak gradient measures 13.2 mmHg. Pulmonic Valve: The pulmonic valve was normal in structure. Pulmonic valve regurgitation is not visualized. Aorta: The aortic root is normal in size and structure. Venous: The inferior vena cava is dilated in size with less than 50% respiratory variability, suggesting right atrial pressure of 15 mmHg. IAS/Shunts: No atrial level shunt detected by color flow Doppler.  LEFT VENTRICLE PLAX 2D LVIDd:         6.50 cm   Diastology LVIDs:         3.80 cm   LV e' medial:    7.58 cm/s LV PW:         1.20 cm   LV E/e' medial:  17.2 LV IVS:        1.20 cm   LV e' lateral:   10.70 cm/s LVOT  diam:     2.00 cm   LV E/e' lateral: 12.1 LV SV:         102 LV SV Index:   42 LVOT Area:     3.14 cm  RIGHT VENTRICLE             IVC RV S prime:     16.10 cm/s  IVC diam: 2.40 cm TAPSE (M-mode): 3.2 cm LEFT ATRIUM              Index        RIGHT ATRIUM           Index LA diam:        4.50 cm  1.87 cm/m   RA Area:     18.40 cm LA Vol (A2C):   110.0 ml 45.66 ml/m  RA Volume:   51.00 ml  21.17 ml/m LA Vol (A4C):   95.8 ml  39.77 ml/m LA Biplane Vol: 107.0 ml 44.42 ml/m  AORTIC VALVE                  PULMONIC VALVE AV Area (Vmax): 2.87 cm      PV Vmax:       1.14 m/s AV Vmax:        182.00 cm/s   PV Peak grad:  5.2 mmHg AV Peak Grad:   13.2 mmHg LVOT Vmax:      166.00 cm/s LVOT Vmean:     109.000 cm/s LVOT VTI:       0.325 m  AORTA Ao Root diam: 3.20 cm Ao Asc diam:  3.60 cm MITRAL VALVE                TRICUSPID VALVE MV Area (PHT): 4.21 cm     TR Peak grad:   33.4 mmHg MV Decel Time: 180 msec     TR Vmax:        289.00 cm/s MV E velocity: 130.00 cm/s MV A velocity: 93.00 cm/s   SHUNTS MV E/A ratio:  1.40         Systemic VTI:  0.32 m                             Systemic Diam: 2.00 cm Dalton McleanMD Electronically signed by Franki Monte Signature Date/Time: 06/14/2021/4:19:14 PM    Final    VAS [place] LOWER EXTREMITY VENOUS (DVT)  Result Date: 06/14/2021  Lower Venous DVT Study Patient Name:  ANDRIS BROTHERS  Date of Exam:   06/14/2021 Medical Rec #: [id number]           Accession #:    [id number] Date of Birth: Apr 28, 1961           Patient Gender: M Patient Age:   67 years Exam [facility] Procedure:      VAS [place] LOWER EXTREMITY VENOUS (DVT) Referring Phys: Roxanne Mins PATEL --------------------------------------------------------------------------------  Indications: Pain, and Swelling.  Comparison Study: Previous exam 04/14/21 was negative for DVT Performing Technologist: Rogelia Rohrer RVT, RDMS  Examination Guidelines: A complete evaluation includes B-mode imaging, spectral Doppler, color  Doppler, and power Doppler as needed of all accessible portions of each vessel. Bilateral testing is considered an integral part of a complete examination. Limited examinations for reoccurring indications may be performed as noted. The reflux portion of the exam is performed with the patient in reverse Trendelenburg.  +---------+---------------+---------+-----------+----------+-------------------+  RIGHT     Compressibility Phasicity Spontaneity Properties Thrombus Aging       +---------+---------------+---------+-----------+----------+-------------------+  CFV       Full            Yes       Yes                                         +---------+---------------+---------+-----------+----------+-------------------+  SFJ       Full                                                                  +---------+---------------+---------+-----------+----------+-------------------+  FV Prox   Full            Yes       Yes                                         +---------+---------------+---------+-----------+----------+-------------------+  FV Mid    Full            Yes       Yes                                         +---------+---------------+---------+-----------+----------+-------------------+  FV Distal Full            Yes       Yes                                         +---------+---------------+---------+-----------+----------+-------------------+  PFV       Full                                                                  +---------+---------------+---------+-----------+----------+-------------------+  POP       Full            Yes       Yes                                         +---------+---------------+---------+-----------+----------+-------------------+  PTV       Full                                                                  +---------+---------------+---------+-----------+----------+-------------------+  PERO                                                       Not well visualized   +---------+---------------+---------+-----------+----------+-------------------+   +----+---------------+---------+-----------+----------+--------------+  LEFT Compressibility Phasicity Spontaneity Properties Thrombus Aging  +----+---------------+---------+-----------+----------+--------------+  CFV  Full            Yes       Yes                                    +----+---------------+---------+-----------+----------+--------------+     Summary: RIGHT: - No evidence of deep vein thrombosis in the lower extremity. No indirect evidence of obstruction proximal to the inguinal ligament. - No evidence of common femoral vein obstruction. - No cystic structure found in the popliteal fossa.  LEFT: - No evidence of common femoral vein obstruction.  *See table(s) above for measurements and observations. Electronically signed by Orlie Pollen on 06/14/2021 at 6:34:47 PM.    Final     Microbiology: Results for orders placed or performed during the hospital encounter of 06/13/21  Resp Panel by RT-PCR (Flu A&B, Covid) Nasopharyngeal Swab     Status: None   Collection Time: 06/13/21  6:40 PM   Specimen: Nasopharyngeal Swab; Nasopharyngeal(NP) swabs in vial transport medium  Result Value Ref Range Status   SARS Coronavirus 2 by RT PCR NEGATIVE NEGATIVE Final    Comment: (NOTE) SARS-CoV-2 target nucleic acids are NOT DETECTED.  The SARS-CoV-2 RNA is generally detectable in upper respiratory specimens during the acute phase of infection. The lowest concentration of SARS-CoV-2 viral copies this assay can detect is 138 copies/mL. A negative result does not preclude SARS-Cov-2 infection and should not be used as the sole basis for treatment or other patient management decisions. A negative result may occur with  improper specimen collection/handling, submission of specimen other than nasopharyngeal swab, presence of viral mutation(s) within the areas targeted by this assay, and inadequate number of viral copies(<138  copies/mL). A negative result must be combined with clinical observations, patient history, and epidemiological information. The expected result is Negative.  Fact Sheet for Patients:  EntrepreneurPulse.com.au  Fact Sheet for Healthcare Providers:  IncredibleEmployment.be  This test is no t yet approved or cleared by the [place] FDA and  has been authorized for detection and/or diagnosis of SARS-CoV-2 by FDA under an Emergency Use Authorization (EUA). This EUA will remain  in effect (  meaning this test can be used) for the duration of the COVID-19 declaration under Section 564(b)(1) of the Act, 21 U.S.C.section 360bbb-3(b)(1), unless the authorization is terminated  or revoked sooner.       Influenza A by PCR NEGATIVE NEGATIVE Final   Influenza B by PCR NEGATIVE NEGATIVE Final    Comment: (NOTE) The Xpert Xpress SARS-CoV-2/FLU/RSV plus assay is intended as an aid in the diagnosis of influenza from Nasopharyngeal swab specimens and should not be used as a sole basis for treatment. Nasal washings and aspirates are unacceptable for Xpert Xpress SARS-CoV-2/FLU/RSV testing.  Fact Sheet for Patients: EntrepreneurPulse.com.au  Fact Sheet for Healthcare Providers: IncredibleEmployment.be  This test is not yet approved or cleared by the [place] FDA and has been authorized for detection and/or diagnosis of SARS-CoV-2 by FDA under an Emergency Use Authorization (EUA). This EUA will remain in effect (meaning this test can be used) for the duration of the COVID-19 declaration under Section 564(b)(1) of the Act, 21 U.S.C. section 360bbb-3(b)(1), unless the authorization is terminated or revoked.  Performed at [facility], [place] [address], [place], [place] [address]   MRSA Next Gen by PCR, Nasal     Status: None   Collection Time: 06/14/21  4:12 AM   Specimen: Nasal Mucosa; Nasal Swab  Result  Value Ref Range Status   MRSA by PCR Next Gen NOT DETECTED NOT DETECTED Final    Comment: (NOTE) The GeneXpert MRSA Assay (FDA approved for NASAL specimens only), is one component of a comprehensive MRSA colonization surveillance program. It is not intended to diagnose MRSA infection nor to guide or monitor treatment for MRSA infections. Test performance is not FDA approved in patients less than 41 years old. Performed at [facility], [place] [address], [place], [place] [address]   SARS CORONAVIRUS 2 (TAT 6-24 HRS) Nasopharyngeal Nasopharyngeal Swab     Status: None   Collection Time: 06/19/21  5:00 PM   Specimen: Nasopharyngeal Swab  Result Value Ref Range Status   SARS Coronavirus 2 NEGATIVE NEGATIVE Final    Comment: (NOTE) SARS-CoV-2 target nucleic acids are NOT DETECTED.  The SARS-CoV-2 RNA is generally detectable in upper and lower respiratory specimens during the acute phase of infection. Negative results do not preclude SARS-CoV-2 infection, do not rule out co-infections with other pathogens, and should not be used as the sole basis for treatment or other patient management decisions. Negative results must be combined with clinical observations, patient history, and epidemiological information. The expected result is Negative.  Fact Sheet for Patients: SugarRoll.be  Fact Sheet for Healthcare Providers: https://www.woods-mathews.com/  This test is not yet approved or cleared by the [place] FDA and  has been authorized for detection and/or diagnosis of SARS-CoV-2 by FDA under an Emergency Use Authorization (EUA). This EUA will remain  in effect (meaning this test can be used) for the duration of the COVID-19 declaration under Se ction 564(b)(1) of the Act, 21 U.S.C. section 360bbb-3(b)(1), unless the authorization is terminated or revoked sooner.  Performed at [facility], [place] [address], [place],  [place] [address]     Labs: CBC: Recent Labs  Lab 06/13/21 1105 06/14/21 0356 06/15/21 0339 06/17/21 0358 06/18/21 0136 06/19/21 0406  WBC 5.3 4.4 5.4 4.8 4.8 5.4  NEUTROABS 2.4  --   --   --   --   --   HGB 7.3* 7.1* 8.2* 7.9* 7.6* 7.6*  HCT 23.4* 22.7* 24.5* 24.8* 22.9* 24.1*  MCV 107.8* 108.6* 101.7* 102.9* 101.8* 103.4*  PLT 179 172 171 158 149* 341   Basic Metabolic Panel: Recent Labs  Lab 06/14/21 0356 06/15/21 0339 06/17/21 0358 06/18/21 0136 06/19/21 0406  NA 138 133* 135 138 136  K 3.4* 3.4* 3.3* 3.1* 3.9  CL 97* 95* 96* 96* 94*  CO2 27 25 26 27 24   GLUCOSE 126* 93 98 105* 100*  BUN 23 17 22 12 20   CREATININE 8.86* 6.01* 6.50* 4.57* 6.57*  CALCIUM 9.6 9.2 10.0 9.2 10.4*  PHOS  --  3.2  --  2.0* 3.8   Liver Function Tests: Recent Labs  Lab 06/13/21 1105 06/15/21 0339 06/18/21 0136 06/19/21 0406  AST 39  --   --   --   ALT 12  --   --   --   ALKPHOS 67  --   --   --   BILITOT 0.5  --   --   --   PROT 8.4*  --   --   --   ALBUMIN 2.5* 2.3* 2.2* 2.3*   CBG: Recent Labs  Lab 06/13/21 2046 06/17/21 1253 06/17/21 1446  GLUCAP 123* 88 101*    Discharge time spent: greater than 30 minutes.  Signed: Tawni Millers, MD [facility] 06/20/2021

## 2021-06-20 NOTE — Progress Notes (Signed)
[name] Progress Note    Assessment/ Plan:   # PAD -VVS following, sp L stump revision 2/20  # ESRD:  -outpatient HD orders: [facility]. MWF 4h  450/500  125 kg  2/2 bath  Hep none  RIJ TDC/ LUA AVF.  Mircera 225 mcg every 2 weeks (last dose: 2/10), Hectorol 13mcg qtx - HD tomorrow   # Vascular access: active access TDC and LUA AVF was plicated by Dr Scot Dock on 04/19/21. Per VVS it is okay to use on 06/14/21. We used the AVF 2/22 w/ 15ga needles and no problems.   # Chest pain - on admission -troponins not significantly elevated -possible related to underlying CHF and/or anemia -chest pain resolved  # Eczema - pruritic skin rash (3x 3 cm) just above the R chest HD cath exit site. Have ordered hydrocortisone 1% cream bid.  HD RN's cut back the  # Volume/ hypertension: EDW 125kg, he is down about 6kg here and is also now 6kg under his dry wt.  Vol overload was due to lean body wt loss and has now been corrected. Lower dry wt at dc.    # Symptomatic anemia, anemia of Chronic Kidney Disease, macrocytosis: Hemoglobin 7.3 on admission . Anemia qhs been an ongoing issue. Currently receiving max dose esa as an outpatient. FOBT neg. R/O bleed per pmd. Iron replete on panel -s/p 1u prbc w/ HD 2/17, hgb better -not due for ESA yet   # Secondary Hyperparathyroidism/Hyperphosphatemia: check phos, on nathiosulfate  # Dispo - for dc to SNF today or tomorrow      Kelly Splinter, MD 06/20/2021, 10:31 AM      Subjective:   Pt w/o c/o's.    Objective:   BP 138/81 (BP Location: Right Arm)    Pulse 76    Temp 97.6 F (36.4 C) (Oral)    Resp 13    Ht 5\' 11"  (1.803 m)    Wt 118.5 kg    SpO2 95%    BMI 36.44 kg/m   Intake/Output Summary (Last 24 hours) at 06/20/2021 1031 Last data filed at 06/19/2021 2339 Gross per 24 hour  Intake 723 ml  Output 24500 ml  Net -23777 ml    Weight change: -0.2 kg  Physical Exam: Gen:nad CVS:rrr Resp:normal wob QHU:TMLY YTK:PTWS bka, trace  RLE edema Neuro: awake, alert Dialysis access: RIJ [facility]  Imaging: VAS US DUPLEX DIALYSIS ACCESS (AVF, AVG)  Result Date: 06/18/2021 DIALYSIS ACCESS Patient Name:  Daniel [name]  Date of Exam:   06/18/2021 Medical Rec #: [id number]           Accession #:    [id number] Date of Birth: 1960-08-27           Patient Gender: M Patient Age:   61 years Exam Location:  [facility] Procedure:      VAS [place] (AVF, AVG) Referring Phys: Vonna Kotyk ROBINS --------------------------------------------------------------------------------  Access Site: Left Upper Extremity. Access Type: Brachial-cephalic AVF. Comparison Study: No prior study Performing Technologist: Maudry Mayhew MHA, RDMS, RVT, RDCS  Examination Guidelines: A complete evaluation includes B-mode imaging, spectral Doppler, color Doppler, and power Doppler as needed of all accessible portions of each vessel. Unilateral testing is considered an integral part of a complete examination. Limited examinations for reoccurring indications may be performed as noted.  Findings: +--------------------+----------+-----------------+--------+  AVF                  PSV (cm/s) Flow Vol (mL/min) Comments  +--------------------+----------+-----------------+--------+  Native artery inflow    224            837                  +--------------------+----------+-----------------+--------+  AVF Anastomosis         226                                 +--------------------+----------+-----------------+--------+  +------------+----------+-------------+----------+------------------+  OUTFLOW VEIN PSV (cm/s) Diameter (cm) Depth (cm)      Describe       +------------+----------+-------------+----------+------------------+  Prox UA         141         0.99         1.00                        +------------+----------+-------------+----------+------------------+  Mid UA          154         1.29         0.30    Focal outpouching    +------------+----------+-------------+----------+------------------+  Dist UA          73         1.33         0.27        aneurysmal      +------------+----------+-------------+----------+------------------+   Summary: 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 No evidence of to-fro flow in this area to suggest pseudoaneurysm. *See table(s) above for measurements and observations.  Diagnosing physician: Servando Snare MD Electronically signed by Servando Snare MD on 06/18/2021 at 2:10:36 PM.   --------------------------------------------------------------------------------   Final     Labs: BMET Recent Labs  Lab 06/13/21 1105 06/14/21 0356 06/15/21 0339 06/17/21 0358 06/18/21 0136 06/19/21 0406  NA 139 138 133* 135 138 136  K 3.4* 3.4* 3.4* 3.3* 3.1* 3.9  CL 97* 97* 95* 96* 96* 94*  CO2 28 27 25 26 27 24   GLUCOSE 96 126* 93 98 105* 100*  BUN 18 23 17 22 12 20   CREATININE 7.86* 8.86* 6.01* 6.50* 4.57* 6.57*  CALCIUM 9.4 9.6 9.2 10.0 9.2 10.4*  PHOS  --   --  3.2  --  2.0* 3.8    CBC Recent Labs  Lab 06/13/21 1105 06/14/21 0356 06/15/21 0339 06/17/21 0358 06/18/21 0136 06/19/21 0406  WBC 5.3   < > 5.4 4.8 4.8 5.4  NEUTROABS 2.4  --   --   --   --   --   HGB 7.3*   < > 8.2* 7.9* 7.6* 7.6*  HCT 23.4*   < > 24.5* 24.8* 22.9* 24.1*  MCV 107.8*   < > 101.7* 102.9* 101.8* 103.4*  PLT 179   < > 171 158 149* 160   < > = values in this interval not displayed.     Medications:     amLODipine  10 mg Oral QHS   calcium acetate  667 mg Oral TID WC   cephALEXin  500 mg Oral Q24H   Chlorhexidine Gluconate Cloth  6 each Topical Q0600   cloNIDine  0.1 mg Oral Once per day on Mon Wed Fri   cloNIDine  0.1 mg Oral 2 times per day on Sun Tue Thu Sat   doxercalciferol  3 mcg Intravenous Q M,W,F-HD   ferrous sulfate  325  mg Oral q morning   gabapentin  100 mg Oral TID   heparin  5,000 Units Subcutaneous Q8H    hydrocortisone cream   Topical BID   multivitamin  1 tablet Oral QHS   nicotine  21 mg Transdermal Daily   pantoprazole  40 mg Oral BID   sodium chloride flush  3 mL Intravenous Q12H

## 2021-06-20 NOTE — Progress Notes (Deleted)
°  Catheter Removal Procedure Note    Diagnosis: ESRD  Plan:  Remove right Internal jugular diatek catheter  Consent signed:  Yes.   Time out completed:  Yes.   Coumadin:  No. PT/INR (if applicable):   Other labs:  Procedure: 1.  Sterile prepping and draping over catheter area on right chest wall 2. 5 ml 2% lidocaine plain instilled at removal site 3.  right catheter removed in its entirety with cuff in tact. 4.  Complications:  None  5. Tip of catheter sent for culture:  No.   Patient tolerated procedure well:  Yes.   Pressure held, no bleeding noted, dressing applied Instructions given to the pt regarding wound care and bleeding   Karoline Caldwell, PA-C 06/20/2021 3:32 PM [phone]

## 2021-06-20 NOTE — Progress Notes (Signed)
°  Catheter Removal Procedure Note   PRE-OPERATIVE DIAGNOSIS:   ESRD.   POST-OPERATIVE DIAGNOSIS:  ESRD.  PROCEDURE:  Removal of Right IJ diatek cath   PROVIDER:   Rishav Rockefeller PA-C.  ANESTHESIA:  5 cc of 2% lidocaine plain   The risks and benefits of this procedure were explained to the patient and the patient consented.   OPERATIVE PROCEDURE:  Following completion of timeout the following procedure was performed at the bedside.  The right side of patients neck and chest was prepped and draped in standard fashion.  Local anesthesia was infiltrated over the tunneled catheter and its cuff.  The cuff was loosened using a combination of blunt and sharp dissection.  The catheter was removed in its entirety, and hemostasis was achieved with local compression.    The patient tolerated the procedure well and did not have any complications.  Patient was given instructions regarding wound care and bleeding   Karoline Caldwell, PA-C [facility] [phone] 06/20/2021  3:38 PM

## 2021-06-20 NOTE — Progress Notes (Signed)
Vascular at the bedside to remove HD catheter.

## 2021-06-21 ENCOUNTER — Other Ambulatory Visit (HOSPITAL_COMMUNITY): Payer: Self-pay

## 2021-06-21 DIAGNOSIS — R079 Chest pain, unspecified: Secondary | ICD-10-CM | POA: Diagnosis not present

## 2021-06-21 DIAGNOSIS — N186 End stage renal disease: Secondary | ICD-10-CM | POA: Diagnosis not present

## 2021-06-21 DIAGNOSIS — I5033 Acute on chronic diastolic (congestive) heart failure: Secondary | ICD-10-CM | POA: Diagnosis not present

## 2021-06-21 DIAGNOSIS — D631 Anemia in chronic kidney disease: Secondary | ICD-10-CM | POA: Diagnosis not present

## 2021-06-21 LAB — RENAL FUNCTION PANEL
Albumin: 2.4 g/dL — ABNORMAL LOW (ref 3.5–5.0)
Anion gap: 12 (ref 5–15)
BUN: 28 mg/dL — ABNORMAL HIGH (ref 8–23)
CO2: 24 mmol/L (ref 22–32)
Calcium: 11.1 mg/dL — ABNORMAL HIGH (ref 8.9–10.3)
Chloride: 96 mmol/L — ABNORMAL LOW (ref 98–111)
Creatinine, Ser: 6.96 mg/dL — ABNORMAL HIGH (ref 0.61–1.24)
GFR, Estimated: 8 mL/min — ABNORMAL LOW (ref >60)
Glucose, Bld: 104 mg/dL — ABNORMAL HIGH (ref 70–99)
Phosphorus: 5.6 mg/dL — ABNORMAL HIGH (ref 2.5–4.6)
Potassium: 4.2 mmol/L (ref 3.5–5.1)
Sodium: 132 mmol/L — ABNORMAL LOW (ref 135–145)

## 2021-06-21 LAB — CBC WITH DIFFERENTIAL/PLATELET
Abs Immature Granulocytes: 0.03 10*3/uL (ref 0.00–0.07)
Basophils Absolute: 0 10*3/uL (ref 0.0–0.1)
Basophils Relative: 0 %
Eosinophils Absolute: 0.2 10*3/uL (ref 0.0–0.5)
Eosinophils Relative: 4 %
HCT: 24.3 % — ABNORMAL LOW (ref 39.0–52.0)
Hemoglobin: 7.8 g/dL — ABNORMAL LOW (ref 13.0–17.0)
Immature Granulocytes: 1 %
Lymphocytes Relative: 30 %
Lymphs Abs: 1.6 10*3/uL (ref 0.7–4.0)
MCH: 33.9 pg (ref 26.0–34.0)
MCHC: 32.1 g/dL (ref 30.0–36.0)
MCV: 105.7 fL — ABNORMAL HIGH (ref 80.0–100.0)
Monocytes Absolute: 0.7 10*3/uL (ref 0.1–1.0)
Monocytes Relative: 13 %
Neutro Abs: 2.8 10*3/uL (ref 1.7–7.7)
Neutrophils Relative %: 52 %
Platelets: 152 10*3/uL (ref 150–400)
RBC: 2.3 MIL/uL — ABNORMAL LOW (ref 4.22–5.81)
RDW: 17.1 % — ABNORMAL HIGH (ref 11.5–15.5)
WBC: 5.3 10*3/uL (ref 4.0–10.5)
nRBC: 0 % (ref 0.0–0.2)

## 2021-06-21 NOTE — Progress Notes (Signed)
RN called to give report to [facility] [phone]. No answer. Will attempt again.

## 2021-06-21 NOTE — Progress Notes (Signed)
Contacted [facility] SW to make clinic aware pt will d/c to snf today and will resume care on Monday.   Melven Sartorius Renal Navigator [phone]

## 2021-06-21 NOTE — Progress Notes (Signed)
°  Wound VAC dressing removed from the L BKA surgical site and placed  a saline moistened dressing to the wound. Covered with an ABD pad and tape in place. Notified pt that SNF will place their wound VAC.

## 2021-06-21 NOTE — Progress Notes (Signed)
Patient is feeling well today, with no dyspnea or chest pain, no nausea or vomiting.   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   Acute heart failure decompensation  Patient tolerating well renal replacement therapy with ultrafiltration. His wound looks clean with wound vac in place.  Plan to transfer to SNF today to continue his care.

## 2021-06-21 NOTE — Consult Note (Addendum)
[place] Nurse Consult Note: Patient receiving care in [place]; patient currently in HD. Reason for Consult: Wound VAC dressing change, then discharge to SNF. Wound type: L BKA surgical site. I learned through the Case Manager, D. Lovena Le, the name of the [facility] the SNF he is being discharged to today, and it does not coordinate with our VAC dressing product.  I communicated that via telephone to [facility] Collins, PA-C and explained our staff can remove our product, place a saline moistened gauze, and the SNF staff will place their product when he arrives.  An order to this effect was entered into the patient record.  I also reviewed this with the primary nurse via telephone. Val Riles, RN, MSN, CWOCN, CNS-BC, pager [phone]

## 2021-06-21 NOTE — TOC Transition Note (Signed)
Transition of Care [facility]) - CM/SW Discharge Note   Patient Details  Name: Daniel [name] MRN: [id number] Date of Birth: 09-Mar-1961  Transition of Care [facility]) CM/SW Contact:  Bethann Berkshire, [place] Phone Number: 06/21/2021, 1:25 PM   Clinical Narrative:     Patient will DC to: [facility] Anticipated DC date:  06/21/21 Transport by: Corey Harold   Per MD patient ready for DC to [facility]. RN, patient, patient's family, and facility notified of DC. Discharge Summary and FL2 sent to facility. RN to call report prior to discharge ([phone]). DC packet on chart. Ambulance transport requested for patient.   CSW will sign off for now as social work intervention is no longer needed. Please consult [place] again if new needs arise.    Final next level of care: Skilled Nursing Facility Barriers to Discharge: No Barriers Identified   Patient Goals and CMS Choice Patient states their goals for this hospitalization and ongoing recovery are:: Return to SNF CMS Medicare.gov Compare Post Acute Care list provided to:: Patient Choice offered to / list presented to : Patient  Discharge Placement              Patient chooses bed at:  ([facility]) Patient to be transferred to facility by: [place] Name of family member notified: Pt did not want CSW to notify anyone Patient and family notified of of transfer: 06/21/21  Discharge Plan and Services In-house Referral: Clinical Social Work   Post Acute Care Choice: [place]                               Social Determinants of Health (SDOH) Interventions     Readmission Risk Interventions Readmission Risk Prevention Plan 02/27/2021 07/07/2019  Transportation Screening Complete Complete  PCP or Specialist Appt within 3-5 Days - Complete  HRI or [place] - Complete  Social Work Consult for [place] Planning/Counseling - Complete  Palliative Care Screening - Not Applicable   Medication Review Press photographer) Complete Complete  PCP or Specialist appointment within 3-5 days of discharge Complete -  [place] or Home Care Consult Complete -  SW Recovery Care/Counseling Consult Complete -  Palliative Care Screening Not Applicable -  [place] Not Applicable -  Some recent data might be hidden

## 2021-06-21 NOTE — Progress Notes (Signed)
[facility] Progress Note    Assessment/ Plan:   # PAD -VVS following, sp L stump revision 2/20  # ESRD:  -outpatient HD orders: [facility]. MWF 4h  450/500  125 kg  2/2 bath  Hep none  RIJ TDC/ LUA AVF.  Mircera 225 mcg every 2 weeks (last dose: 2/10), Hectorol 16mcg qtx - HD today   # Vascular access: LUA AVF was plicated by Dr Scot Dock on 04/19/21. Per VVS it was okay to use on 06/14/21. We used the AVF 06/19/21 and today w/ no problems. His TDC was removed yesterday 2/23 by VVS.   # Chest pain - on admission -troponins not significantly elevated -possible related to underlying CHF and/or anemia -chest pain resolved  # Eczema - pruritic skin rash (3x 3 cm) just above the R chest HD cath exit site. Have ordered hydrocortisone 1% cream bid.    # Volume/ hypertension: EDW 125kg, he is down about 6kg here and is also now 6kg under his dry wt.  Vol overload was due to lean body wt loss and has now been corrected. Lower dry wt at dc.    # Symptomatic anemia, anemia of Chronic Kidney Disease, macrocytosis: Hemoglobin 7.3 on admission . Anemia qhs been an ongoing issue. Currently receiving max dose esa as an outpatient. FOBT neg. R/O bleed per pmd. Iron replete on panel -s/p 1u prbc w/ HD 2/17, hgb better -not due for ESA yet   # Secondary Hyperparathyroidism/Hyperphosphatemia: check phos, on nathiosulfate  # Dispo - for dc to SNF today       Kelly Splinter, MD 06/21/2021, 11:44 AM      Subjective:   Pt w/o c/o's.    Objective:   BP (!) 129/57    Pulse 79    Temp 97.7 F (36.5 C) (Oral)    Resp 14    Ht 5\' 11"  (1.803 m)    Wt 117.8 kg    SpO2 100%    BMI 36.22 kg/m   Intake/Output Summary (Last 24 hours) at 06/21/2021 1144 Last data filed at 06/21/2021 3545 Gross per 24 hour  Intake 923 ml  Output --  Net 923 ml    Weight change: 0.2 kg  Physical Exam: Gen:nad CVS:rrr Resp:normal wob GYB:WLSL HTD:SKAJ bka, trace RLE edema Neuro: awake, alert Dialysis  access: RIJ TDC  Imaging: No results found.  Labs: BMET Recent Labs  Lab 06/15/21 0339 06/17/21 0358 06/18/21 0136 06/19/21 0406 06/21/21 0854  NA 133* 135 138 136 132*  K 3.4* 3.3* 3.1* 3.9 4.2  CL 95* 96* 96* 94* 96*  CO2 25 26 27 24 24   GLUCOSE 93 98 105* 100* 104*  BUN 17 22 12 20  28*  CREATININE 6.01* 6.50* 4.57* 6.57* 6.96*  CALCIUM 9.2 10.0 9.2 10.4* 11.1*  PHOS 3.2  --  2.0* 3.8 5.6*    CBC Recent Labs  Lab 06/17/21 0358 06/18/21 0136 06/19/21 0406 06/21/21 0853  WBC 4.8 4.8 5.4 5.3  NEUTROABS  --   --   --  2.8  HGB 7.9* 7.6* 7.6* 7.8*  HCT 24.8* 22.9* 24.1* 24.3*  MCV 102.9* 101.8* 103.4* 105.7*  PLT 158 149* 160 152     Medications:     amLODipine  10 mg Oral QHS   calcium acetate  667 mg Oral TID WC   cephALEXin  500 mg Oral Q24H   Chlorhexidine Gluconate Cloth  6 each Topical Q0600   cloNIDine  0.1 mg Oral Once per day on  Mon Wed Fri   cloNIDine  0.1 mg Oral 2 times per day on Sun Tue Thu Sat   doxercalciferol  3 mcg Intravenous Q M,W,F-HD   ferrous sulfate  325 mg Oral q morning   gabapentin  100 mg Oral TID   heparin  5,000 Units Subcutaneous Q8H   hydrocortisone cream   Topical BID   lidocaine HCl (PF)  10 mL Intradermal Once   multivitamin  1 tablet Oral QHS   nicotine  21 mg Transdermal Daily   pantoprazole  40 mg Oral BID   sodium chloride flush  3 mL Intravenous Q12H

## 2021-06-24 NOTE — Telephone Encounter (Signed)
**Note De-Identified  Obfuscation** TCM Call 1st attempt: The pt states that he is currently at dialysis and request that I call him back tomorrow.

## 2021-06-25 ENCOUNTER — Telehealth: Payer: Self-pay | Admitting: *Deleted

## 2021-06-25 NOTE — Telephone Encounter (Signed)
**Note De-Identified  Obfuscation** Transition Care Management Unsuccessful Follow-up Telephone Call  Date of discharge and from where:  Friday 06/21/2021 from [facility]  Attempts:  2nd Attempt  Reason for unsuccessful TCM follow-up call:  Left voice message on the pts cell phone asking him to call Jeani Hawking at [facility] at [phone].  His home phone rings but I got no answer and he has no VM.

## 2021-06-25 NOTE — Telephone Encounter (Signed)
Received a call from [facility] regarding patient. They left a message to call back. No information was left on our VM other then phone number [phone]. I have called 2 times and it rings and goes to voice mail. Left message for them to return call.

## 2021-06-26 ENCOUNTER — Ambulatory Visit (INDEPENDENT_AMBULATORY_CARE_PROVIDER_SITE_OTHER): Payer: Medicare Other | Admitting: Cardiovascular Disease

## 2021-06-26 ENCOUNTER — Ambulatory Visit: Payer: Medicare Other | Admitting: Internal Medicine

## 2021-06-26 ENCOUNTER — Encounter: Payer: Self-pay | Admitting: Cardiovascular Disease

## 2021-06-26 ENCOUNTER — Other Ambulatory Visit: Payer: Self-pay

## 2021-06-26 VITALS — BP 143/87 | HR 71 | Ht 71.0 in | Wt 240.0 lb

## 2021-06-26 DIAGNOSIS — I10 Essential (primary) hypertension: Secondary | ICD-10-CM | POA: Diagnosis not present

## 2021-06-26 DIAGNOSIS — R072 Precordial pain: Secondary | ICD-10-CM | POA: Diagnosis not present

## 2021-06-26 DIAGNOSIS — I739 Peripheral vascular disease, unspecified: Secondary | ICD-10-CM | POA: Diagnosis not present

## 2021-06-26 DIAGNOSIS — Z720 Tobacco use: Secondary | ICD-10-CM | POA: Diagnosis not present

## 2021-06-26 MED ORDER — METOPROLOL TARTRATE 100 MG PO TABS: 100.0000 mg | ORAL_TABLET | Freq: Once | ORAL | 0 refills | Status: DC

## 2021-06-26 NOTE — Assessment & Plan Note (Signed)
Ongoing tobacco abuse of 1/2 pack/day with over 20 pack years of tobacco abuse recalcitrant to risk factor modification. ?

## 2021-06-26 NOTE — Patient Instructions (Addendum)
Medication Instructions:  ?Your physician recommends that you continue on your current medications as directed. Please refer to the Current Medication list given to you today. ? ?*If you need a refill on your cardiac medications before your next appointment, please call your pharmacy* ? ? ?Testing/Procedures: ?See below. ? ? ?Follow-Up: ?At [facility], you and your health needs are our priority.  As part of our continuing mission to provide you with exceptional heart care, we have created designated Provider Care Teams.  These Care Teams include your primary Cardiologist (physician) and Advanced Practice Providers (APPs -  Physician Assistants and Nurse Practitioners) who all work together to provide you with the care you need, when you need it. ? ?We recommend signing up for the patient portal called "MyChart".  Sign up information is provided on this After Visit Summary.  MyChart is used to connect with patients for Virtual Visits (Telemedicine).  Patients are able to view lab/test results, encounter notes, upcoming appointments, etc.  Non-urgent messages can be sent to your provider as well.   ?To learn more about what you can do with MyChart, go to [email or web address].   ? ?Your next appointment:   ?3 month(s) ? ?The format for your next appointment:   ?In Person ? ?Provider:   ?Coletta Memos, FNP, Fabian Sharp, PA-C, Sande Rives, PA-C, Caron Presume, PA-C, Jory Sims, DNP, ANP, or Almyra Deforest, PA-C     ? ?Then, Quay Burow, MD will plan to see you again in 6 month(s). ? ? ?Other Instructions ? ? ?Your cardiac CT will be scheduled at the below location:  ? ?[facility] ?[address] ?[place], [place] [address] ?[phone] ? ? ?If scheduled at [facility], please arrive at the [facility] and Children's Entrance (Entrance C2) of [facility] 30 minutes prior to test start time. ?You can use the FREE valet parking offered at entrance C (encouraged to control the  heart rate for the test)  ?Proceed to the [facility] Radiology Department (first floor) to check-in and test prep. ? ?All radiology patients and guests should use entrance C2 at [facility], accessed from [facility], even though the hospital's physical address listed is [address]. ? ? ? ? ?Please follow these instructions carefully (unless otherwise directed): ? ?Hold all erectile dysfunction medications at least 3 days (72 hrs) prior to test. ? ?On the Night Before the Test: ?Be sure to Drink plenty of water. ?Do not consume any caffeinated/decaffeinated beverages or chocolate 12 hours prior to your test. ?Do not take any antihistamines 12 hours prior to your test. ? ? ?On the Day of the Test: ?Drink plenty of water until 1 hour prior to the test. ?Do not eat any food 4 hours prior to the test. ?You may take your regular medications prior to the test.  ?Take metoprolol (Lopressor)100mg  two hours prior to test. ?HOLD Furosemide/Hydrochlorothiazide morning of the test. ? ? ?After the Test: ?Drink plenty of water. ?After receiving IV contrast, you may experience a mild flushed feeling. This is normal. ?On occasion, you may experience a mild rash up to 24 hours after the test. This is not dangerous. If this occurs, you can take Benadryl 25 mg and increase your fluid intake. ?If you experience trouble breathing, this can be serious. If it is severe call 911 IMMEDIATELY. If it is mild, please call our office. ?If you take any of these medications: Glipizide/Metformin, Avandament, Glucavance, please do not take 48 hours  after completing test unless otherwise instructed. ? ?We will call to schedule your test 2-4 weeks out understanding that some insurance companies will need an authorization prior to the service being performed.  ? ?For non-scheduling related questions, please contact the cardiac imaging nurse navigator should you have any questions/concerns: ?Marchia Bond, Cardiac Imaging  Nurse Navigator ?Gordy Clement, Cardiac Imaging Nurse Navigator ?[facility] ?Direct Office Dial: [phone]  ? ?For scheduling needs, including cancellations and rescheduling, please call [place], [phone]. ? ? ?

## 2021-06-26 NOTE — Assessment & Plan Note (Signed)
History of severe PAD status post recent angiogram by Dr. Donzetta Matters 03/29/2021 because of critical ischemia.  There were no endovascular surgical options and he ended up having a left BKA.  He has not yet been fitted with a prosthesis. ?

## 2021-06-26 NOTE — Assessment & Plan Note (Signed)
Daniel [name] was admitted to the hospital on 06/13/2021 3 with atypical chest pain.  His troponins were flat.  His 2D echo showed normal LV function although he was volume overloaded.  His EKG showed no acute changes.  It was suggested that he have a coronary CTA to further evaluate. ?

## 2021-06-26 NOTE — Assessment & Plan Note (Signed)
History of essential hypertension a blood pressure measured today at 143/87.  He is on amlodipine and clonidine. ?

## 2021-06-26 NOTE — Telephone Encounter (Signed)
**Note De-Identified  Obfuscation** The pt had an office visit this morning with Dr Gwenlyn Found at our [place]. ? ?TOC Call not needed. ?

## 2021-06-26 NOTE — Progress Notes (Signed)
? ? ? ?06/26/2021 ?Daniel [name]   ?November 09, 1960  ?[id number] ? ?Primary Physician [name], NP ?Primary Cardiologist: Lorretta Harp MD Daniel [name], [place] ? ?HPI:  Daniel [name] is a 61 y.o. 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  I am seeing him for his first postop visit having been discharged in the hospital on 06/21/2021.  He has no prior history of heart disease.  His risk factors for cardiovascular disease include ongoing tobacco abuse with 20 pack years of tobacco abuse currently smoking 1/2 pack/day, treated hypertension, type 2 diabetes.  He also has GERD.  He has PAD status post left BKA with peripheral angiogram performed by Dr. Donzetta Matters 03/29/2021 showing no endovascular surgical options for limb salvage.  He was admitted to the hospital on 06/13/2021 with chest pain.  His troponins were flat.  2D echo was normal.  He presents now for posthospital follow-up.  He does have continued atypical chest pain. ? ? ?Current Meds  ?Medication Sig  ? amLODipine (NORVASC) 10 MG tablet Take 1 tablet (10 mg total) by mouth at bedtime.  ? blood glucose meter kit and supplies KIT Dispense based on patient and insurance preference. Use up to four times daily as directed. (FOR ICD-9 250.00, 250.01).  ? calcium acetate (PHOSLO) 667 MG capsule Take 1,334 mg by mouth 3 (three) times daily with meals.  ? cloNIDine (CATAPRES) 0.1 MG tablet Take 0.1 mg by mouth See admin instructions. Take one tablet (0.1 mg) twice daily at 9am and 6pm on Sunday, Tuesday, Thursday, Saturday (non-dialysis days), take one tablet (0.1 mg) at bedtime - 9pm on Monday, Wednesday, Friday (dialysis days)  ? ferrous sulfate 325 (65 FE) MG tablet Take 325 mg by mouth every morning.  ? gabapentin (NEURONTIN) 100 MG capsule Take 1 capsule (100 mg total) by mouth 3 (three) times daily.   ? hydrOXYzine (ATARAX) 25 MG tablet TAKE 1 TABLET BY MOUTH EVERY 8 HOURS AS NEEDED FOR ANXIETY OR  ITCHING ?Strength: 25 mg  ? Lidocaine 5 % CREA Apply 1 application topically every Monday, Wednesday, and Friday. Dialysis days  ? loperamide (IMODIUM) 2 MG capsule Take 2 capsules (4 mg total) by mouth 3 (three) times daily as needed for diarrhea or loose stools. (Patient taking differently: Take 4 mg by mouth every 8 (eight) hours as needed for diarrhea or loose stools.)  ? Multiple Vitamin (MULTIVITAMIN WITH MINERALS) TABS tablet Take 1 tablet by mouth at bedtime.  ? Nutritional Supplements (,FEEDING SUPPLEMENT, PROSOURCE PLUS) liquid Take 30 mLs by mouth 3 (three) times daily after meals.  ? oxyCODONE-acetaminophen (PERCOCET/ROXICET) 5-325 MG tablet Take 1 tablet by mouth every 6 (six) hours as needed for severe pain.  ? pantoprazole (PROTONIX) 40 MG tablet Take 1 tablet (40 mg total) by mouth 2 (two) times daily.  ? saccharomyces boulardii (FLORASTOR) 250 MG capsule Take 1 capsule (250 mg total) by mouth 2 (two) times daily.  ? silver sulfADIAZINE (SILVADENE) 1 % cream Apply 1 application topically daily. To lower back  ?  ? ?Allergies  ?Allergen Reactions  ? Seroquel [Quetiapine] Other (See Comments)  ?  Tardive kinesia/dystonia  ? Dilaudid [Hydromorphone Hcl] Itching and Other (See Comments)  ?  Pt reports itchiness after IM injection   ? ? ?Social History  ? ?Socioeconomic History  ? Marital status: Single  ?  Spouse  name: Not on file  ? Number of children: Not on file  ? Years of education: Not on file  ? Highest education level: Not on file  ?Occupational History  ? Occupation: disabled  ?Tobacco Use  ? Smoking status: Every Day  ?  Packs/day: 0.50  ?  Years: 45.00  ?  Pack years: 22.50  ?  Types: Cigarettes  ? Smokeless tobacco: Never  ?Vaping Use  ? Vaping Use: Never used  ?Substance and Sexual Activity  ? Alcohol use: Not Currently  ?  Alcohol/week: 12.0 standard drinks  ?  Types: 12 Cans of beer per week   ? Drug use: Not Currently  ?  Types: "Crack" cocaine  ?  Comment: last in 2020  ? Sexual activity: Yes  ?  Birth control/protection: None  ?Other Topics Concern  ? Not on file  ?Social History Narrative  ? Not on file  ? ?Social Determinants of Health  ? ?Financial Resource Strain: Not on file  ?Food Insecurity: Not on file  ?Transportation Needs: Not on file  ?Physical Activity: Not on file  ?Stress: Not on file  ?Social Connections: Not on file  ?Intimate Partner Violence: Not on file  ?  ? ?Review of Systems: ?General: negative for chills, fever, night sweats or weight changes.  ?Cardiovascular: negative for chest pain, dyspnea on exertion, edema, orthopnea, palpitations, paroxysmal nocturnal dyspnea or shortness of breath ?Dermatological: negative for rash ?Respiratory: negative for cough or wheezing ?Urologic: negative for hematuria ?Abdominal: negative for nausea, vomiting, diarrhea, bright red blood per rectum, melena, or hematemesis ?Neurologic: negative for visual changes, syncope, or dizziness ?All other systems reviewed and are otherwise negative except as noted above. ? ? ? ?Blood pressure (!) 143/87, pulse 71, height 5' 11"  (1.803 m), weight 240 lb (108.9 kg), SpO2 99 %.  ?General appearance: alert and no distress ?Neck: no adenopathy, no JVD, supple, symmetrical, trachea midline, thyroid not enlarged, symmetric, no tenderness/mass/nodules, and soft right carotid bruit ?Lungs: clear to auscultation bilaterally ?Heart: regular rate and rhythm, S1, S2 normal, no murmur, click, rub or gallop ?Extremities: extremities normal, atraumatic, no cyanosis or edema ?Pulses: Left BKA, absent pedal pulse on right ?Skin: Skin color, texture, turgor normal. No rashes or lesions ?Neurologic: Grossly normal ? ?EKG not performed today ? ?ASSESSMENT AND PLAN:  ? ?Essential hypertension ?History of essential hypertension a blood pressure measured today at 143/87.  He is on amlodipine and clonidine. ? ?Tobacco use ?Ongoing  tobacco abuse of 1/2 pack/day with over 20 pack years of tobacco abuse recalcitrant to risk factor modification. ? ?PAD (peripheral artery disease) ([place] ?History of severe PAD status post recent angiogram by Dr. Donzetta Matters 03/29/2021 because of critical ischemia.  There were no endovascular surgical options and he ended up having a left BKA.  He has not yet been fitted with a prosthesis. ? ?Chest pain ?Mr. Gramm was admitted to the hospital on 06/13/2021 3 with atypical chest pain.  His troponins were flat.  His 2D echo showed normal LV function although he was volume overloaded.  His EKG showed no acute changes.  It was suggested that he have a coronary CTA to further evaluate. ? ? ? ? ?Lorretta Harp MD FACP,FACC,FAHA, FSCAI ?06/26/2021 ?10:26 AM ?

## 2021-06-27 ENCOUNTER — Telehealth: Payer: Self-pay

## 2021-06-27 NOTE — Telephone Encounter (Signed)
[place] with [facility] called about Daniel [name] refusing to wear his wound Vac. She stated that at first patient requested to have the wound vac removed on his HD days (M-W-F) then eventually refused to have the wound vac replaced once he got back to the center after HD. She states that they have been doing wet to dry dressing changes daily until they got orders for how to continue from our office. I spoke with our in house PA Dagoberto Ligas & he advised that the wet to dry dressing changes are fine and the patient will need to be seen in the next few weeks for a wound check. Patient scheduled and [place] advised of current treatment plan. She voiced her understanding. ?

## 2021-06-28 ENCOUNTER — Telehealth: Payer: Self-pay | Admitting: Cardiovascular Disease

## 2021-06-28 NOTE — Telephone Encounter (Signed)
Left message to call back  

## 2021-06-28 NOTE — Telephone Encounter (Signed)
Patient would like to do his Coronary CT at [facility]. The facility will need the orders faxed to them at [phone] ?

## 2021-07-03 ENCOUNTER — Other Ambulatory Visit: Payer: Self-pay

## 2021-07-03 ENCOUNTER — Emergency Department (HOSPITAL_COMMUNITY): Payer: Medicare Other

## 2021-07-03 ENCOUNTER — Encounter (HOSPITAL_COMMUNITY): Payer: Self-pay | Admitting: Emergency Medicine

## 2021-07-03 ENCOUNTER — Inpatient Hospital Stay (HOSPITAL_COMMUNITY)
Admission: EM | Admit: 2021-07-03 | Discharge: 2021-07-10 | DRG: 356 | Disposition: A | Discharge status: Skilled Nursing Facility | Payer: Medicare Other | Attending: Internal Medicine | Admitting: Internal Medicine

## 2021-07-03 DIAGNOSIS — E1122 Type 2 diabetes mellitus with diabetic chronic kidney disease: Secondary | ICD-10-CM | POA: Diagnosis present

## 2021-07-03 DIAGNOSIS — K922 Gastrointestinal hemorrhage, unspecified: Secondary | ICD-10-CM | POA: Diagnosis present

## 2021-07-03 DIAGNOSIS — D500 Iron deficiency anemia secondary to blood loss (chronic): Secondary | ICD-10-CM | POA: Diagnosis present

## 2021-07-03 DIAGNOSIS — T8781 Dehiscence of amputation stump: Secondary | ICD-10-CM | POA: Diagnosis present

## 2021-07-03 DIAGNOSIS — L89322 Pressure ulcer of left buttock, stage 2: Secondary | ICD-10-CM | POA: Diagnosis present

## 2021-07-03 DIAGNOSIS — Z79899 Other long term (current) drug therapy: Secondary | ICD-10-CM

## 2021-07-03 DIAGNOSIS — M79671 Pain in right foot: Secondary | ICD-10-CM | POA: Diagnosis not present

## 2021-07-03 DIAGNOSIS — L89312 Pressure ulcer of right buttock, stage 2: Secondary | ICD-10-CM | POA: Diagnosis present

## 2021-07-03 DIAGNOSIS — N186 End stage renal disease: Secondary | ICD-10-CM | POA: Diagnosis present

## 2021-07-03 DIAGNOSIS — K219 Gastro-esophageal reflux disease without esophagitis: Secondary | ICD-10-CM | POA: Diagnosis not present

## 2021-07-03 DIAGNOSIS — S81802A Unspecified open wound, left lower leg, initial encounter: Secondary | ICD-10-CM

## 2021-07-03 DIAGNOSIS — N185 Chronic kidney disease, stage 5: Secondary | ICD-10-CM | POA: Diagnosis not present

## 2021-07-03 DIAGNOSIS — K269 Duodenal ulcer, unspecified as acute or chronic, without hemorrhage or perforation: Secondary | ICD-10-CM | POA: Diagnosis present

## 2021-07-03 DIAGNOSIS — L8990 Pressure ulcer of unspecified site, unspecified stage: Secondary | ICD-10-CM | POA: Diagnosis present

## 2021-07-03 DIAGNOSIS — Z6836 Body mass index (BMI) 36.0-36.9, adult: Secondary | ICD-10-CM

## 2021-07-03 DIAGNOSIS — Y835 Amputation of limb(s) as the cause of abnormal reaction of the patient, or of later complication, without mention of misadventure at the time of the procedure: Secondary | ICD-10-CM | POA: Diagnosis present

## 2021-07-03 DIAGNOSIS — F17210 Nicotine dependence, cigarettes, uncomplicated: Secondary | ICD-10-CM | POA: Diagnosis present

## 2021-07-03 DIAGNOSIS — E1151 Type 2 diabetes mellitus with diabetic peripheral angiopathy without gangrene: Secondary | ICD-10-CM | POA: Diagnosis present

## 2021-07-03 DIAGNOSIS — I739 Peripheral vascular disease, unspecified: Secondary | ICD-10-CM | POA: Diagnosis not present

## 2021-07-03 DIAGNOSIS — D649 Anemia, unspecified: Secondary | ICD-10-CM

## 2021-07-03 DIAGNOSIS — I10 Essential (primary) hypertension: Secondary | ICD-10-CM | POA: Diagnosis not present

## 2021-07-03 DIAGNOSIS — K8020 Calculus of gallbladder without cholecystitis without obstruction: Secondary | ICD-10-CM | POA: Diagnosis present

## 2021-07-03 DIAGNOSIS — K31811 Angiodysplasia of stomach and duodenum with bleeding: Secondary | ICD-10-CM | POA: Diagnosis present

## 2021-07-03 DIAGNOSIS — D491 Neoplasm of unspecified behavior of respiratory system: Secondary | ICD-10-CM | POA: Diagnosis present

## 2021-07-03 DIAGNOSIS — Z885 Allergy status to narcotic agent status: Secondary | ICD-10-CM

## 2021-07-03 DIAGNOSIS — Z89512 Acquired absence of left leg below knee: Secondary | ICD-10-CM

## 2021-07-03 DIAGNOSIS — Z20822 Contact with and (suspected) exposure to covid-19: Secondary | ICD-10-CM | POA: Diagnosis present

## 2021-07-03 DIAGNOSIS — K921 Melena: Principal | ICD-10-CM | POA: Diagnosis present

## 2021-07-03 DIAGNOSIS — R079 Chest pain, unspecified: Secondary | ICD-10-CM

## 2021-07-03 DIAGNOSIS — M79604 Pain in right leg: Secondary | ICD-10-CM | POA: Diagnosis present

## 2021-07-03 DIAGNOSIS — I998 Other disorder of circulatory system: Secondary | ICD-10-CM | POA: Diagnosis not present

## 2021-07-03 DIAGNOSIS — Z888 Allergy status to other drugs, medicaments and biological substances status: Secondary | ICD-10-CM

## 2021-07-03 DIAGNOSIS — E669 Obesity, unspecified: Secondary | ICD-10-CM | POA: Diagnosis present

## 2021-07-03 DIAGNOSIS — Z992 Dependence on renal dialysis: Secondary | ICD-10-CM

## 2021-07-03 DIAGNOSIS — M898X9 Other specified disorders of bone, unspecified site: Secondary | ICD-10-CM | POA: Diagnosis present

## 2021-07-03 DIAGNOSIS — I70203 Unspecified atherosclerosis of native arteries of extremities, bilateral legs: Secondary | ICD-10-CM | POA: Diagnosis present

## 2021-07-03 DIAGNOSIS — Z9049 Acquired absence of other specified parts of digestive tract: Secondary | ICD-10-CM

## 2021-07-03 DIAGNOSIS — R9431 Abnormal electrocardiogram [ECG] [EKG]: Secondary | ICD-10-CM | POA: Diagnosis present

## 2021-07-03 DIAGNOSIS — R0781 Pleurodynia: Secondary | ICD-10-CM | POA: Diagnosis present

## 2021-07-03 DIAGNOSIS — K5791 Diverticulosis of intestine, part unspecified, without perforation or abscess with bleeding: Secondary | ICD-10-CM | POA: Diagnosis present

## 2021-07-03 LAB — HEMOGLOBIN AND HEMATOCRIT, BLOOD
HCT: 23.8 % — ABNORMAL LOW (ref 39.0–52.0)
Hemoglobin: 7.6 g/dL — ABNORMAL LOW (ref 13.0–17.0)

## 2021-07-03 LAB — D-DIMER, QUANTITATIVE: D-Dimer, Quant: 2.7 ug/mL-FEU — ABNORMAL HIGH (ref 0.00–0.50)

## 2021-07-03 LAB — CBC WITH DIFFERENTIAL/PLATELET
Abs Immature Granulocytes: 0.05 10*3/uL (ref 0.00–0.07)
Basophils Absolute: 0 10*3/uL (ref 0.0–0.1)
Basophils Relative: 1 %
Eosinophils Absolute: 0.1 10*3/uL (ref 0.0–0.5)
Eosinophils Relative: 3 %
HCT: 25.3 % — ABNORMAL LOW (ref 39.0–52.0)
Hemoglobin: 8 g/dL — ABNORMAL LOW (ref 13.0–17.0)
Immature Granulocytes: 1 %
Lymphocytes Relative: 33 %
Lymphs Abs: 1.4 10*3/uL (ref 0.7–4.0)
MCH: 34.2 pg — ABNORMAL HIGH (ref 26.0–34.0)
MCHC: 31.6 g/dL (ref 30.0–36.0)
MCV: 108.1 fL — ABNORMAL HIGH (ref 80.0–100.0)
Monocytes Absolute: 0.5 10*3/uL (ref 0.1–1.0)
Monocytes Relative: 12 %
Neutro Abs: 2.2 10*3/uL (ref 1.7–7.7)
Neutrophils Relative %: 50 %
Platelets: 173 10*3/uL (ref 150–400)
RBC: 2.34 MIL/uL — ABNORMAL LOW (ref 4.22–5.81)
RDW: 17.8 % — ABNORMAL HIGH (ref 11.5–15.5)
WBC: 4.4 10*3/uL (ref 4.0–10.5)
nRBC: 1.1 % — ABNORMAL HIGH (ref 0.0–0.2)

## 2021-07-03 LAB — RESP PANEL BY RT-PCR (FLU A&B, COVID) ARPGX2
Influenza A by PCR: NEGATIVE
Influenza B by PCR: NEGATIVE
SARS Coronavirus 2 by RT PCR: NEGATIVE

## 2021-07-03 LAB — TROPONIN I (HIGH SENSITIVITY)
Troponin I (High Sensitivity): 11 ng/L (ref <18)
Troponin I (High Sensitivity): 10 ng/L (ref <18)

## 2021-07-03 LAB — PROTIME-INR
INR: 1.1 (ref 0.8–1.2)
Prothrombin Time: 13.7 seconds (ref 11.4–15.2)

## 2021-07-03 LAB — COMPREHENSIVE METABOLIC PANEL
ALT: 16 U/L (ref 0–44)
AST: 82 U/L — ABNORMAL HIGH (ref 15–41)
Albumin: 2.6 g/dL — ABNORMAL LOW (ref 3.5–5.0)
Alkaline Phosphatase: 64 U/L (ref 38–126)
Anion gap: 18 — ABNORMAL HIGH (ref 5–15)
BUN: 11 mg/dL (ref 8–23)
CO2: 27 mmol/L (ref 22–32)
Calcium: 8.8 mg/dL — ABNORMAL LOW (ref 8.9–10.3)
Chloride: 95 mmol/L — ABNORMAL LOW (ref 98–111)
Creatinine, Ser: 4.25 mg/dL — ABNORMAL HIGH (ref 0.61–1.24)
GFR, Estimated: 15 mL/min — ABNORMAL LOW (ref >60)
Glucose, Bld: 94 mg/dL (ref 70–99)
Potassium: 4.9 mmol/L (ref 3.5–5.1)
Sodium: 140 mmol/L (ref 135–145)
Total Bilirubin: 2 mg/dL — ABNORMAL HIGH (ref 0.3–1.2)
Total Protein: 7.7 g/dL (ref 6.5–8.1)

## 2021-07-03 LAB — POC OCCULT BLOOD, ED: Fecal Occult Bld: POSITIVE — AB

## 2021-07-03 MED ORDER — GABAPENTIN 100 MG PO CAPS
100.0000 mg | ORAL_CAPSULE | Freq: Once | ORAL | Status: AC
  Administered 2021-07-03: 100 mg via ORAL
  Filled 2021-07-03: qty 1

## 2021-07-03 MED ORDER — FENTANYL CITRATE PF 50 MCG/ML IJ SOSY
50.0000 ug | PREFILLED_SYRINGE | Freq: Once | INTRAMUSCULAR | Status: AC
  Administered 2021-07-03: 50 ug via INTRAVENOUS
  Filled 2021-07-03: qty 1

## 2021-07-03 MED ORDER — ACETAMINOPHEN 650 MG RE SUPP: 650.0000 mg | Freq: Four times a day (QID) | RECTAL | Status: DC | PRN

## 2021-07-03 MED ORDER — LIDOCAINE 5 % EX PTCH
1.0000 | MEDICATED_PATCH | CUTANEOUS | Status: DC
  Administered 2021-07-03 – 2021-07-09 (×7): 1 via TRANSDERMAL
  Filled 2021-07-03 (×7): qty 1

## 2021-07-03 MED ORDER — IOHEXOL 350 MG/ML SOLN
100.0000 mL | Freq: Once | INTRAVENOUS | Status: AC | PRN
  Administered 2021-07-03: 100 mL via INTRAVENOUS

## 2021-07-03 MED ORDER — ACETAMINOPHEN 325 MG PO TABS
650.0000 mg | ORAL_TABLET | Freq: Four times a day (QID) | ORAL | Status: DC | PRN
  Administered 2021-07-04 – 2021-07-06 (×2): 650 mg via ORAL
  Filled 2021-07-03 (×2): qty 2

## 2021-07-03 MED ORDER — MORPHINE SULFATE (PF) 4 MG/ML IV SOLN
4.0000 mg | Freq: Once | INTRAVENOUS | Status: AC
  Administered 2021-07-03: 4 mg via INTRAVENOUS
  Filled 2021-07-03: qty 1

## 2021-07-03 NOTE — Consult Note (Signed)
Hospital Consult    Reason for Consult:  Right foot pain, concern for ischemic foot with no pulses Referring Physician:  ED MRN #:  [id number]  History of Present Illness: This is a 61 y.o. male with multiple medical comorbidities including diabetes, end-stage renal disease, hypertension, known PAD, recent left below-knee amputation that vascular surgery has been consulted for right foot pain with concern for an ischemic limb with no pulses.  Patient states his right foot has been hurting for 5 months.  He is motor/sensory intact.  States his foot feels "tight."  He is well-known to our service and most recent underwent a left below-knee amputation on 06/17/2021 by Dr. Virl Cagey.  He is currently doing wet-to-dry dressings to the stump.  He also has had an angiogram of his right lower extremity as recently as 04/08/2021 by Dr. Donzetta Matters with findings of a calcified SFA with posterior tibial runoff into the foot.  Past Medical History:  Diagnosis Date   Anemia    Diabetes mellitus without complication [facility])    patient denies   Dialysis patient [facility])    End stage chronic kidney disease ([place])    Hypertension    ICH (intracerebral hemorrhage) ([place]) 05/20/2017   PAD (peripheral artery disease) ([facility])    Shoulder pain, left 06/28/2013    Past Surgical History:  Procedure Laterality Date   A/V FISTULAGRAM N/A 08/15/2020   Procedure: A/V FISTULAGRAM - Left Upper;  Surgeon: Cherre Robins, MD;  Location: [place] [facility];  Service: Cardiovascular;  Laterality: N/A;   ABDOMINAL AORTOGRAM W/LOWER EXTREMITY N/A 04/08/2021   Procedure: ABDOMINAL AORTOGRAM W/LOWER EXTREMITY;  Surgeon: Waynetta Sandy, MD;  Location: [place] [facility];  Service: Cardiovascular;  Laterality: N/A;   AMPUTATION Left 04/16/2021   Procedure: LEFT BELOW KNEE AMPUTATION;  Surgeon: Angelia Mould, MD;  Location: [place];  Service: Vascular;  Laterality: Left;   AMPUTATION Left 06/17/2021   Procedure: REVISION  AMPUTATION BELOW KNEE;  Surgeon: Broadus John, MD;  Location: [place];  Service: Vascular;  Laterality: Left;   APPENDECTOMY     APPLICATION OF WOUND VAC Left 06/17/2021   Procedure: APPLICATION OF WOUND VAC;  Surgeon: Broadus John, MD;  Location: [place];  Service: Vascular;  Laterality: Left;   AV FISTULA PLACEMENT Left 08/03/2019   Procedure: LEFT ARM ARTERIOVENOUS (AV) CEPHALIC  FISTULA CREATION;  Surgeon: Waynetta Sandy, MD;  Location: [place];  Service: Vascular;  Laterality: Left;   BIOPSY  06/30/2019   Procedure: BIOPSY;  Surgeon: Ronald Lobo, MD;  Location: [facility] ENDOSCOPY;  Service: Endoscopy;;   BIOPSY  08/02/2019   Procedure: BIOPSY;  Surgeon: Yetta Flock, MD;  Location: [facility] ENDOSCOPY;  Service: Gastroenterology;;   BIOPSY  02/08/2021   Procedure: BIOPSY;  Surgeon: Sharyn Creamer, MD;  Location: [facility] ENDOSCOPY;  Service: Gastroenterology;;   BIOPSY  02/24/2021   Procedure: BIOPSY;  Surgeon: [name], MD;  Location: [facility] ENDOSCOPY;  Service: Gastroenterology;;   BIOPSY  03/26/2021   Procedure: BIOPSY;  Surgeon: Lavena Bullion, DO;  Location: [place];  Service: Gastroenterology;;   COLONOSCOPY  01/23/2012   Procedure: COLONOSCOPY;  Surgeon: Danie Binder, MD;  [facility];  Service: Endoscopy;  Laterality: N/A;  11:10 AM   COLONOSCOPY WITH PROPOFOL N/A 06/30/2019   Procedure: COLONOSCOPY WITH PROPOFOL;  Surgeon: Ronald Lobo, MD;  Location: [place];  Service: Endoscopy;  Laterality: N/A;   ENTEROSCOPY N/A 08/02/2019   Procedure: ENTEROSCOPY;  Surgeon:  Armbruster, Carlota Raspberry, MD;  Location: [place];  Service: Gastroenterology;  Laterality: N/A;   ENTEROSCOPY N/A 02/24/2021   Procedure: ENTEROSCOPY;  Surgeon: [name], MD;  Location: [facility] ENDOSCOPY;  Service: Gastroenterology;  Laterality: N/A;   ENTEROSCOPY N/A 03/26/2021   Procedure: ENTEROSCOPY;  Surgeon: Lavena Bullion, DO;  Location: [place];  Service:  Gastroenterology;  Laterality: N/A;   ESOPHAGOGASTRODUODENOSCOPY N/A 08/10/2020   Procedure: ESOPHAGOGASTRODUODENOSCOPY (EGD);  Surgeon: Jerene Bears, MD;  Location: [facility] ENDOSCOPY;  Service: Gastroenterology;  Laterality: N/A;   ESOPHAGOGASTRODUODENOSCOPY (EGD) WITH PROPOFOL N/A 06/30/2019   Procedure: ESOPHAGOGASTRODUODENOSCOPY (EGD) WITH PROPOFOL;  Surgeon: Ronald Lobo, MD;  Location: [place];  Service: Endoscopy;  Laterality: N/A;   ESOPHAGOGASTRODUODENOSCOPY (EGD) WITH PROPOFOL N/A 01/12/2021   Procedure: ESOPHAGOGASTRODUODENOSCOPY (EGD) WITH PROPOFOL;  Surgeon: Sharyn Creamer, MD;  Location: [place];  Service: Gastroenterology;  Laterality: N/A;   ESOPHAGOGASTRODUODENOSCOPY (EGD) WITH PROPOFOL N/A 02/08/2021   Procedure: ESOPHAGOGASTRODUODENOSCOPY (EGD) WITH PROPOFOL;  Surgeon: Sharyn Creamer, MD;  Location: [place];  Service: Gastroenterology;  Laterality: N/A;   FISTULA SUPERFICIALIZATION Left 10/17/2019   Procedure: LEFT UPPER EXTREMITY FISTULA REVISION, SIDE BRANCH LIGATION,  AND SUPERFICIALIZATION;  Surgeon: Marty Heck, MD;  Location: [place];  Service: Vascular;  Laterality: Left;   FISTULA SUPERFICIALIZATION Left 84/69/6295   Procedure: PLICATION OF ANEURYSM LEFT ARTERIOVENOUS FISTULA;  Surgeon: Angelia Mould, MD;  Location: [place];  Service: Vascular;  Laterality: Left;   GIVENS CAPSULE STUDY N/A 06/30/2019   Procedure: GIVENS CAPSULE STUDY;  Surgeon: Ronald Lobo, MD;  Location: [place];  Service: Endoscopy;  Laterality: N/A;   HEMOSTASIS CLIP PLACEMENT  08/10/2020   Procedure: HEMOSTASIS CLIP PLACEMENT;  Surgeon: Jerene Bears, MD;  Location: [place];  Service: Gastroenterology;;   HEMOSTASIS CLIP PLACEMENT  01/12/2021   Procedure: HEMOSTASIS CLIP PLACEMENT;  Surgeon: Sharyn Creamer, MD;  Location: [place];  Service: Gastroenterology;;   HEMOSTASIS CONTROL  08/02/2019   Procedure: HEMOSTASIS CONTROL;  Surgeon: Yetta Flock, MD;   Location: [place];  Service: Gastroenterology;;   HOT HEMOSTASIS  02/24/2021   Procedure: HOT HEMOSTASIS (ARGON PLASMA COAGULATION/BICAP);  Surgeon: [name], MD;  Location: [facility] ENDOSCOPY;  Service: Gastroenterology;;   HOT HEMOSTASIS N/A 03/26/2021   Procedure: HOT HEMOSTASIS (ARGON PLASMA COAGULATION/BICAP);  Surgeon: Lavena Bullion, DO;  Location: [facility] ENDOSCOPY;  Service: Gastroenterology;  Laterality: N/A;   INCISION AND DRAINAGE ABSCESS N/A 06/29/2016   Procedure: INCISION AND DRAINAGE ABDOMINAL WALL ABSCESS;  Surgeon: Alphonsa Overall, MD;  Location: [facility];  Service: General;  Laterality: N/A;   INSERTION OF DIALYSIS CATHETER Right 08/03/2019   Procedure: INSERTION OF DIALYSIS CATHETER;  Surgeon: Waynetta Sandy, MD;  Location: [place];  Service: Vascular;  Laterality: Right;   INSERTION OF DIALYSIS CATHETER Right 10/22/2019   Procedure: INSERTION OF 23CM TUNNELED DIALYSIS CATHETER RIGHT INTERNAL JUGULAR;  Surgeon: Angelia Mould, MD;  Location: [place];  Service: Vascular;  Laterality: Right;   INSERTION OF DIALYSIS CATHETER Right 08/12/2020   Procedure: INSERTION OF Right internal Jugular TUNNELED  DIALYSIS CATHETER.;  Surgeon: Waynetta Sandy, MD;  Location: [place];  Service: Vascular;  Laterality: Right;   INSERTION OF DIALYSIS CATHETER N/A 04/19/2021   Procedure: INSERTION OF TUNNELED DIALYSIS CATHETER;  Surgeon: Angelia Mould, MD;  Location: [place];  Service: Vascular;  Laterality: N/A;   Left heel surgery     PENILE BIOPSY N/A 03/26/2020   Procedure: PENILE ULCER DEBRIDEMENT;  Surgeon:  Remi Haggard, MD;  Location: [facility];  Service: Urology;  Laterality: N/A;  30 MINS   SCLEROTHERAPY  01/12/2021   Procedure: SCLEROTHERAPY;  Surgeon: Sharyn Creamer, MD;  Location: [facility] ENDOSCOPY;  Service: Gastroenterology;;   Clide Deutscher  02/24/2021   Procedure: Clide Deutscher;  Surgeon: [name], MD;  Location: [facility] ENDOSCOPY;  Service:  Gastroenterology;;    Allergies  Allergen Reactions   Seroquel [Quetiapine] Other (See Comments)    Tardive kinesia/dystonia   Dilaudid [Hydromorphone Hcl] Itching and Other (See Comments)    Pt reports itchiness after IM injection     Prior to Admission medications   Medication Sig Start Date End Date Taking? Authorizing Provider  amLODipine (NORVASC) 10 MG tablet Take 1 tablet (10 mg total) by mouth at bedtime. 03/23/21   [name], NP  blood glucose meter kit and supplies KIT Dispense based on patient and insurance preference. Use up to four times daily as directed. (FOR ICD-9 250.00, 250.01). 07/01/16   Nita Sells, MD  calcium acetate (PHOSLO) 667 MG capsule Take 1,334 mg by mouth 3 (three) times daily with meals. 03/14/21   [provider]  cloNIDine (CATAPRES) 0.1 MG tablet Take 0.1 mg by mouth See admin instructions. Take one tablet (0.1 mg) twice daily at 9am and 6pm on Sunday, Tuesday, Thursday, Saturday (non-dialysis days), take one tablet (0.1 mg) at bedtime - 9pm on Monday, Wednesday, Friday (dialysis days) 03/13/21   [provider]  ferrous sulfate 325 (65 FE) MG tablet Take 325 mg by mouth every morning.    [provider]  gabapentin (NEURONTIN) 100 MG capsule Take 1 capsule (100 mg total) by mouth 3 (three) times daily. 04/09/21   Waynetta Sandy, MD  hydrOXYzine (ATARAX) 25 MG tablet TAKE 1 TABLET BY MOUTH EVERY 8 HOURS AS NEEDED FOR ANXIETY OR  ITCHING Strength: 25 mg 04/23/21   Bonnielee Haff, MD  Lidocaine 5 % CREA Apply 1 application topically every Monday, Wednesday, and Friday. Dialysis days    [provider]  loperamide (IMODIUM) 2 MG capsule Take 2 capsules (4 mg total) by mouth 3 (three) times daily as needed for diarrhea or loose stools. Patient taking differently: Take 4 mg by mouth every 8 (eight) hours as needed for diarrhea or loose stools. 04/23/21   Bonnielee Haff, MD  metoprolol tartrate  (LOPRESSOR) 100 MG tablet Take 1 tablet (100 mg total) by mouth once for 1 dose. To be taken 2 hours prior to procedure. 06/26/21 06/26/21  Lorretta Harp, MD  Multiple Vitamin (MULTIVITAMIN WITH MINERALS) TABS tablet Take 1 tablet by mouth at bedtime.    [provider]  Nutritional Supplements (,FEEDING SUPPLEMENT, PROSOURCE PLUS) liquid Take 30 mLs by mouth 3 (three) times daily after meals.    [provider]  oxyCODONE-acetaminophen (PERCOCET/ROXICET) 5-325 MG tablet Take 1 tablet by mouth every 6 (six) hours as needed for severe pain. 06/20/21   Arrien, Jimmy Picket, MD  pantoprazole (PROTONIX) 40 MG tablet Take 1 tablet (40 mg total) by mouth 2 (two) times daily. 02/28/21   Charlynne Cousins, MD  saccharomyces boulardii (FLORASTOR) 250 MG capsule Take 1 capsule (250 mg total) by mouth 2 (two) times daily. 04/23/21   Bonnielee Haff, MD  silver sulfADIAZINE (SILVADENE) 1 % cream Apply 1 application topically daily. To lower back    [provider]  colchicine 0.6 MG tablet Take 0.5 tablets (0.3 mg total) by mouth 2 (two) times daily. 07/24/20 07/29/20  Sabra Heck,  Aaron Edelman, MD  furosemide (LASIX) 40 MG tablet Take 1 tablet (40 mg total) by mouth daily. 07/25/19 02/09/20  Ladona Horns, MD    Social History   Socioeconomic History   Marital status: Single    Spouse name: Not on file   Number of children: Not on file   Years of education: Not on file   Highest education level: Not on file  Occupational History   Occupation: disabled  Tobacco Use   Smoking status: Every Day    Packs/day: 0.50    Years: 45.00    Pack years: 22.50    Types: Cigarettes   Smokeless tobacco: Never  Vaping Use   Vaping Use: Never used  Substance and Sexual Activity   Alcohol use: Not Currently    Alcohol/week: 12.0 standard drinks    Types: 12 Cans of beer per week   Drug use: Not Currently    Types: "Crack" cocaine    Comment: last in 2020   Sexual activity: Yes    Birth  control/protection: None  Other Topics Concern   Not on file  Social History Narrative   Not on file   Social Determinants of Health   Financial Resource Strain: Not on file  Food Insecurity: Not on file  Transportation Needs: Not on file  Physical Activity: Not on file  Stress: Not on file  Social Connections: Not on file  Intimate Partner Violence: Not on file     Family History  Problem Relation Age of Onset   Colon cancer Neg Hx     ROS: _0  Positive   _1  Negative   _2  All sytems reviewed and are negative  Cardiovascular: _3  chest pain/pressure _4  palpitations _5  SOB lying flat _6  DOE _7  pain in legs while walking _8  pain in legs at rest - right foot _9  pain in legs at night - right foot _10  non-healing ulcers _11  hx of DVT _12  swelling in legs  Pulmonary: _13  productive cough _14  asthma/wheezing _15  home O2  Neurologic: _16  weakness in _17  arms _18  legs _19  numbness in _20  arms _21  legs _22  hx of CVA _23  mini stroke _24 difficulty speaking or slurred speech _25  temporary loss of vision in one eye _26  dizziness  Hematologic: _27  hx of cancer _28  bleeding problems _29  problems with blood clotting easily  Endocrine:   _30  diabetes _31  thyroid disease  GI _32  vomiting blood _33  blood in stool  GU: _34  CKD/renal failure _35  HD--_36  M/W/F or _37  T/T/S _38  burning with urination _39  blood in urine  Psychiatric: _40  anxiety _41  depression  Musculoskeletal: _42  arthritis _43  joint pain  Integumentary: _44  rashes _45  ulcers  Constitutional: _46  fever _47  chills   Physical Examination  Vitals:   07/03/21 1945 07/03/21 2030  BP: 134/72 127/65  Pulse: 74 74  Resp: 20 18  Temp: 98.8 F (37.1 C)   SpO2: 97% 97%   Body mass index is 36.26 kg/m.  General:  WDWN in NAD Gait: Not observed HENT: WNL, normocephalic Pulmonary: normal non-labored breathing Cardiac: regular, without  Murmurs, rubs or gallops;  Abdomen:  soft, NT/ND Vascular Exam/Pulses: Bilateral  femoral pulses palpable Right PT brisk by Doppler Right foot warm and motor/sensory intact Left BKA pictured below - granulation tissue present Musculoskeletal: no muscle wasting or atrophy  Neurologic: A&O X 3; Appropriate Affect ; SENSATION: normal; MOTOR FUNCTION:  moving all extremities equally. Speech is fluent/normal      CBC    Component Value Date/Time   WBC 4.4  07/03/2021 1700   RBC 2.34 (L) 07/03/2021 1700   HGB 8.0 (L) 07/03/2021 1700   HGB 8.4 (L) 08/22/2020 1141   HCT 25.3 (L) 07/03/2021 1700   HCT 25.0 (L) 08/22/2020 1141   PLT 173 07/03/2021 1700   PLT 184 08/22/2020 1141   MCV 108.1 (H) 07/03/2021 1700   MCV 94 08/22/2020 1141   MCH 34.2 (H) 07/03/2021 1700   MCHC 31.6 07/03/2021 1700   RDW 17.8 (H) 07/03/2021 1700   RDW 16.7 (H) 08/22/2020 1141   LYMPHSABS 1.4 07/03/2021 1700   LYMPHSABS 1.8 08/22/2020 1141   MONOABS 0.5 07/03/2021 1700   EOSABS 0.1 07/03/2021 1700   EOSABS 0.3 08/22/2020 1141   BASOSABS 0.0 07/03/2021 1700   BASOSABS 0.0 08/22/2020 1141    BMET    Component Value Date/Time   NA 140 07/03/2021 1700   NA 138 08/22/2020 1141   K 4.9 07/03/2021 1700   CL 95 (L) 07/03/2021 1700   CO2 27 07/03/2021 1700   GLUCOSE 94 07/03/2021 1700   BUN 11 07/03/2021 1700   BUN 20 08/22/2020 1141   CREATININE 4.25 (H) 07/03/2021 1700   CALCIUM 8.8 (L) 07/03/2021 1700   CALCIUM 7.9 (L) 08/01/2019 0702   GFRNONAA 15 (L) 07/03/2021 1700   GFRAA 7 (L) 12/28/2019 1640    COAGS: Lab Results  Component Value Date   INR 1.1 07/03/2021   INR 1.0 03/24/2021   INR 1.1 10/28/2020     Non-Invasive Vascular Imaging:    CTA reviewed that shows flow down the right lower extremity into posterior tibial artery in the foot with calcified arteries.  He has high grade stenosis in SFA.   ASSESSMENT/PLAN: This is a 61 y.o. male with multiple medical comorbidities including diabetes, end-stage renal disease, hypertension, known PAD, recent left below-knee  amputation that vascular surgery has been consulted for right foot pain with concern for an ischemic limb with no pulses.   He has a brisk right PT signal on exam and the right foot is motor/sensory intact.  He appears to be at his baseline from a vascular standpoint.  I reviewed his CTA which shows flow into the right foot through the PT artery.  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  This is chronic.  No indication for urgent vascular intervention from my standpoint.    I think his chest pain and GI bleed are much more pressing issues.  Vascular will follow.  Would continue wet-to-dry dressings to his left BKA.  Marty Heck, MD [facility] Office: [place]

## 2021-07-03 NOTE — ED Provider Notes (Signed)
[facility] EMERGENCY DEPARTMENT Provider Note   CSN: [id number] Arrival date & time: 07/03/21  1659     History  Chief Complaint  Patient presents with   GI Problem    CP    BENJY KANA is a 61 y.o. male.  HPI  61 year old male with a history of ESRD on HD, HTN, ICH, anemia, DM 2, peripheral arterial disease, status post BKA on the left who presents to the emergency department with bright red blood per rectum.  The patient states that he completed a full course of dialysis earlier today.  He used the restroom and had a large amount of bright red blood noted in his stool.  He additionally endorses a sudden onset of right-sided chest pain after dialysis.  No radiation to the pain.  No shortness of breath.  Additionally, the patient endorses pain in his left lower extremity.   Home Medications Prior to Admission medications   Medication Sig Start Date End Date Taking? Authorizing Provider  amLODipine (NORVASC) 10 MG tablet Take 1 tablet (10 mg total) by mouth at bedtime. 03/23/21   [name], NP  blood glucose meter kit and supplies KIT Dispense based on patient and insurance preference. Use up to four times daily as directed. (FOR ICD-9 250.00, 250.01). 07/01/16   Nita Sells, MD  calcium acetate (PHOSLO) 667 MG capsule Take 1,334 mg by mouth 3 (three) times daily with meals. 03/14/21   [provider]  cloNIDine (CATAPRES) 0.1 MG tablet Take 0.1 mg by mouth See admin instructions. Take one tablet (0.1 mg) twice daily at 9am and 6pm on Sunday, Tuesday, Thursday, Saturday (non-dialysis days), take one tablet (0.1 mg) at bedtime - 9pm on Monday, Wednesday, Friday (dialysis days) 03/13/21   [provider]  ferrous sulfate 325 (65 FE) MG tablet Take 325 mg by mouth every morning.    [provider]  gabapentin (NEURONTIN) 100 MG capsule Take 1 capsule (100 mg total) by mouth 3 (three) times daily. 04/09/21   Waynetta Sandy, MD  hydrOXYzine (ATARAX) 25 MG tablet TAKE 1 TABLET BY MOUTH EVERY 8 HOURS AS NEEDED FOR ANXIETY OR  ITCHING Strength: 25 mg 04/23/21   Bonnielee Haff, MD  Lidocaine 5 % CREA Apply 1 application topically every Monday, Wednesday, and Friday. Dialysis days    [provider]  loperamide (IMODIUM) 2 MG capsule Take 2 capsules (4 mg total) by mouth 3 (three) times daily as needed for diarrhea or loose stools. Patient taking differently: Take 4 mg by mouth every 8 (eight) hours as needed for diarrhea or loose stools. 04/23/21   Bonnielee Haff, MD  metoprolol tartrate (LOPRESSOR) 100 MG tablet Take 1 tablet (100 mg total) by mouth once for 1 dose. To be taken 2 hours prior to procedure. 06/26/21 06/26/21  Lorretta Harp, MD  Multiple Vitamin (MULTIVITAMIN WITH MINERALS) TABS tablet Take 1 tablet by mouth at bedtime.    [provider]  Nutritional Supplements (,FEEDING SUPPLEMENT, PROSOURCE PLUS) liquid Take 30 mLs by mouth 3 (three) times daily after meals.    [provider]  oxyCODONE-acetaminophen (PERCOCET/ROXICET) 5-325 MG tablet Take 1 tablet by mouth every 6 (six) hours as needed for severe pain. 06/20/21   Arrien, Jimmy Picket, MD  pantoprazole (PROTONIX) 40 MG tablet Take 1 tablet (40 mg total) by mouth 2 (two) times daily. 02/28/21   Charlynne Cousins, MD  saccharomyces boulardii (FLORASTOR) 250 MG capsule Take 1 capsule (250 mg  total) by mouth 2 (two) times daily. 04/23/21   Bonnielee Haff, MD  silver sulfADIAZINE (SILVADENE) 1 % cream Apply 1 application topically daily. To lower back    [provider]  colchicine 0.6 MG tablet Take 0.5 tablets (0.3 mg total) by mouth 2 (two) times daily. 07/24/20 07/29/20  Noemi Chapel, MD  furosemide (LASIX) 40 MG tablet Take 1 tablet (40 mg total) by mouth daily. 07/25/19 02/09/20  Ladona Horns, MD      Allergies    Seroquel [quetiapine] and Dilaudid [hydromorphone hcl]    Review of Systems   Review  of Systems  All other systems reviewed and are negative.  Physical Exam Updated Vital Signs BP (!) 146/80    Pulse 80    Temp 98.1 F (36.7 C) (Oral)    Resp 17    Ht 5' 11"  (1.803 m)    Wt 117.9 kg    SpO2 95%    BMI 36.26 kg/m  Physical Exam Vitals and nursing note reviewed. Exam conducted with a chaperone present.  Constitutional:      General: He is not in acute distress.    Appearance: He is well-developed.  HENT:     Head: Normocephalic and atraumatic.  Eyes:     Conjunctiva/sclera: Conjunctivae normal.  Cardiovascular:     Rate and Rhythm: Normal rate and regular rhythm.  Pulmonary:     Effort: Pulmonary effort is normal. No respiratory distress.     Breath sounds: Normal breath sounds.  Chest:     Comments: Right chest wall tenderness to palpation that reproduces the patient's pain Abdominal:     Palpations: Abdomen is soft.     Tenderness: There is no abdominal tenderness.  Genitourinary:    Rectum: Guaiac result positive.  Musculoskeletal:        General: Tenderness present. No swelling.     Cervical back: Neck supple.     Comments: Left lower extremity BKA with dressing in place, tenderness to palpation.  Right lower extremity with 1+ pitting edema, some pain noted, unable to appreciate palpable DP or PT pulses.  Subsequent PT pulse present by Doppler.  Skin:    General: Skin is warm and dry.     Capillary Refill: Capillary refill takes less than 2 seconds.  Neurological:     Mental Status: He is alert.  Psychiatric:        Mood and Affect: Mood normal.      ED Results / Procedures / Treatments   Labs (all labs ordered are listed, but only abnormal results are displayed) Labs Reviewed  COMPREHENSIVE METABOLIC PANEL - Abnormal; Notable for the following components:      Result Value   Chloride 95 (*)    Creatinine, Ser 4.25 (*)    Calcium 8.8 (*)    Albumin 2.6 (*)    AST 82 (*)    Total Bilirubin 2.0 (*)    GFR, Estimated 15 (*)    Anion gap 18  (*)    All other components within normal limits  CBC WITH DIFFERENTIAL/PLATELET - Abnormal; Notable for the following components:   RBC 2.34 (*)    Hemoglobin 8.0 (*)    HCT 25.3 (*)    MCV 108.1 (*)    MCH 34.2 (*)    RDW 17.8 (*)    nRBC 1.1 (*)    All other components within normal limits  D-DIMER, QUANTITATIVE - Abnormal; Notable for the following components:   D-Dimer, Quant 2.70 (*)  All other components within normal limits  HEMOGLOBIN AND HEMATOCRIT, BLOOD - Abnormal; Notable for the following components:   Hemoglobin 7.6 (*)    HCT 23.8 (*)    All other components within normal limits  POC OCCULT BLOOD, ED - Abnormal; Notable for the following components:   Fecal Occult Bld POSITIVE (*)    All other components within normal limits  RESP PANEL BY RT-PCR (FLU A&B, COVID) ARPGX2  PROTIME-INR  CBC WITH DIFFERENTIAL/PLATELET  HEMOGLOBIN AND HEMATOCRIT, BLOOD  CBC WITH DIFFERENTIAL/PLATELET  COMPREHENSIVE METABOLIC PANEL  MAGNESIUM  TYPE AND SCREEN  TROPONIN I (HIGH SENSITIVITY)  TROPONIN I (HIGH SENSITIVITY)    EKG EKG Interpretation  Date/Time:  Wednesday July 03 2021 17:06:28 EST Ventricular Rate:  82 PR Interval:  163 QRS Duration: 108 QT Interval:  440 QTC Calculation: 514 R Axis:   -6 Text Interpretation: Sinus rhythm Anterior infarct, old Prolonged QT interval Confirmed by Regan Lemming ([id number]) on 07/03/2021 5:43:55 PM  Radiology CT Angio Aortobifemoral W and/or Wo Contrast  Result Date: 07/03/2021 CLINICAL DATA:  History of left BKA due to known peripheral vascular disease with right leg pain, initial encounter EXAM: CT ANGIOGRAPHY OF ABDOMINAL AORTA WITH ILIOFEMORAL RUNOFF TECHNIQUE: Multidetector CT imaging of the abdomen, pelvis and lower extremities was performed using the standard protocol during bolus administration of intravenous contrast. Multiplanar CT image reconstructions and MIPs were obtained to evaluate the vascular anatomy. RADIATION DOSE  REDUCTION: This exam was performed according to the departmental dose-optimization program which includes automated exposure control, adjustment of the mA and/or kV according to patient size and/or use of iterative reconstruction technique. CONTRAST:  165m OMNIPAQUE IOHEXOL 350 MG/ML SOLN COMPARISON:  03/19/2021 CT, catheter based angiography from 04/08/21 FINDINGS: VASCULAR Aorta: Atherosclerotic calcifications are noted without aneurysmal dilatation or dissection. Celiac: Atherosclerotic calcifications are noted. No focal stenosis or aneurysmal dilatation is noted. SMA: Patent without evidence of aneurysm, dissection, vasculitis or significant stenosis. Renals: Single renal arteries are identified bilaterally with mild atherosclerotic change. IMA: Patent without evidence of aneurysm, dissection, vasculitis or significant stenosis. RIGHT Lower Extremity Inflow: Right iliac artery demonstrates atherosclerotic calcifications. Previously seen stenosis in the external iliac artery is not as well appreciated on today's exam. Runoff: Common femoral artery is calcified. Femoral bifurcation is patent. Superficial femoral artery demonstrates calcifications throughout its course with heavy focal calcification in the mid thigh similar to that seen on the prior exam. Focal stenosis in this area is noted as well. Popliteal artery also demonstrates heavy calcifications with multifocal areas of stenosis similar to that seen on the prior study. Infrapopliteal vessels demonstrate heavy calcifications limiting evaluation of patency. This is similar to that seen on the prior exam. Recent angiogram obtained in December of 2022 shows the proximal infrapopliteal vessels to be patent. Primary runoff is noted posterior tibial artery. Correlate with Doppler pulses. LEFT Lower Extremity Inflow: Atherosclerotic calcifications of the iliac arteries are seen without focal hemodynamically significant stenosis. Runoff: Common femoral artery is  patent as is the femoral bifurcation heavy calcifications are noted in the mid to distal superficial femoral artery with focal stenosis similar to that seen on prior arteriogram. No occlusive changes are seen. The popliteal artery also demonstrates heavy atherosclerotic calcifications. Popliteal trifurcation shows heavy calcifications. Changes of recent BKA are noted. Veins: No specific venous abnormality is noted. Review of the MIP images confirms the above findings. NON-VASCULAR Lower chest: Minimal right pleural effusion is noted. No focal infiltrate is seen. Hepatobiliary: Liver is within normal limits. Cholelithiasis is  noted without complicating factors. Pancreas: Unremarkable. No pancreatic ductal dilatation or surrounding inflammatory changes. Spleen: Normal in size without focal abnormality. Adrenals/Urinary Tract: Adrenal glands are well visualized and within normal limits. Kidneys show nonobstructing stone in the lower pole of the left kidney similar to that seen prior exam. The ureters are within normal limits. The bladder is partially distended. Stomach/Bowel: No obstructive or inflammatory changes of the colon are seen. Mild diverticular changes noted without evidence of diverticulitis. Some contrast is noted in the distal most aspect of the rectum which may represent some hemorrhoid related bleeding. Appendix has been surgically removed. Small bowel and stomach are within normal limits. Lymphatic: No sizable lymphadenopathy is seen. Reproductive: Prostate is unremarkable. Other: No abdominal wall hernia or abnormality. No abdominopelvic ascites. Musculoskeletal: There are changes consistent with prior below-knee amputation. No bony changes to suggest osteomyelitis are seen. No acute bony abnormality is noted. There are degenerative changes of lumbar spine are seen. IMPRESSION: VASCULAR Heavy vascular calcifications are identified bilaterally with areas of stenosis in the superficial femoral arteries  bilaterally similar to that seen on prior angiography from December of 2022. Distal runoff is difficult to evaluate due to heavy calcifications in the infrapopliteal vessels on the right although prior arteriography showed dominant runoff via the right posterior tibial artery. The overall appearance is similar to that seen on the prior exam. Previously described right external iliac artery stenosis is not appreciated on this exam and recent arteriography showed no focal stenosis in that region. NON-VASCULAR Evidence of cholelithiasis similar to that seen on the prior exam. Changes of prior appendectomy. Left BKA without complicating factors. Mild contrast is noted in the distal aspect of the rectum which may be related to hemorrhoidal bleed. Electronically Signed   By: Inez Catalina M.D.   On: 07/03/2021 19:45   DG Chest Portable 1 View  Result Date: 07/03/2021 CLINICAL DATA:  Chest pain. EXAM: PORTABLE CHEST 1 VIEW COMPARISON:  June 13, 2021 FINDINGS: The dialysis catheter has been removed. Tortuosity and calcific atherosclerotic disease of the aorta. Enlarged cardiac silhouette. There is no evidence of focal airspace consolidation, pleural effusion or pneumothorax. Minimal pulmonary vascular congestion. Osseous structures are without acute abnormality. Soft tissues are grossly normal. IMPRESSION: 1. Enlarged cardiac silhouette. 2. Minimal pulmonary vascular congestion. 3. Tortuosity and calcific atherosclerotic disease of the aorta. Electronically Signed   By: Fidela Salisbury M.D.   On: 07/03/2021 17:57    Procedures Procedures    Medications Ordered in ED Medications  lidocaine (LIDODERM) 5 % 1 patch (1 patch Transdermal Patch Applied 07/03/21 1815)  acetaminophen (TYLENOL) tablet 650 mg (has no administration in time range)    Or  acetaminophen (TYLENOL) suppository 650 mg (has no administration in time range)  morphine (PF) 4 MG/ML injection 4 mg (4 mg Intravenous Given 07/03/21 1813)   iohexol (OMNIPAQUE) 350 MG/ML injection 100 mL (100 mLs Intravenous Contrast Given 07/03/21 1901)  fentaNYL (SUBLIMAZE) injection 50 mcg (50 mcg Intravenous Given 07/03/21 2015)  gabapentin (NEURONTIN) capsule 100 mg (100 mg Oral Given 07/03/21 2055)  fentaNYL (SUBLIMAZE) injection 50 mcg (50 mcg Intravenous Given 07/03/21 2315)    ED Course/ Medical Decision Making/ A&P Clinical Course as of 07/04/21 0153  Wed Jul 03, 2021  2023 Fecal Occult Blood, POC(!): POSITIVE [JL]    Clinical Course User Index [JL] Regan Lemming, MD  Medical Decision Making Amount and/or Complexity of Data Reviewed Labs: ordered. Decision-making details documented in ED Course. Radiology: ordered.  Risk Prescription drug management. Decision regarding hospitalization.   61 year old male with a history of ESRD on HD, HTN, ICH, anemia, DM 2, peripheral arterial disease, status post BKA on the left who presents to the emergency department with bright red blood per rectum.  The patient states that he completed a full course of dialysis earlier today.  He used the restroom and had a large amount of bright red blood noted in his stool.  He additionally endorses a sudden onset of right-sided chest pain after dialysis.  No radiation to the pain.  No shortness of breath.  Additionally, the patient endorses pain in his left lower extremity.   On exam, the patient had guaiac positive stool with no bright red blood noticed or melena present.  The patient had an additional 100 cc dark red bowel movement in the ED subsequently.  He presents with concern for an active GI bleed likely from the lower GI tract.  No palpable hemorrhoids on exam.  He additionally complains of acute on chronic lower extremity claudication which is occurring at rest.  Due to this, a CT angio iliofemoral runoff was ordered and vascular surgery was consulted.  Dr. Carlis Abbott of vascular surgery will evaluate the patient.  CTA revealed  the following findings:   IMPRESSION:  VASCULAR     Heavy vascular calcifications are identified bilaterally with areas  of stenosis in the superficial femoral arteries bilaterally similar  to that seen on prior angiography from December of 2022.     Distal runoff is difficult to evaluate due to heavy calcifications  in the infrapopliteal vessels on the right although prior  arteriography showed dominant runoff via the right posterior tibial  artery. The overall appearance is similar to that seen on the prior  exam.     Previously described right external iliac artery stenosis is not  appreciated on this exam and recent arteriography showed no focal  stenosis in that region.     NON-VASCULAR     Evidence of cholelithiasis similar to that seen on the prior exam.     Changes of prior appendectomy.     Left BKA without complicating factors.     Mild contrast is noted in the distal aspect of the rectum which may  be related to hemorrhoidal bleed.      The patient's hemoglobin was found to be 8.0 on initial CBC.  He had an additional bowel movement in the emergency department and a repeat hemoglobin was downtrending to 7.6.  I provided fentanyl for pain control in addition to morphine.  The patient did complain of right-sided chest pain and had tenderness to palpation of his right chest wall.  A lidocaine patch was applied.  Troponins x2 were collected and negative.  Low concern for ACS at this time.  No evidence for PE on imaging.  Due to the patient's active GI bleed, elevated Glasgow Blatchford bleeding score, hospitalist medicine was consulted for admission.   Final Clinical Impression(s) / ED Diagnoses Final diagnoses:  Gastrointestinal hemorrhage, unspecified gastrointestinal hemorrhage type  Anemia, unspecified type  Chest pain, unspecified type  Wound of left lower extremity, initial encounter    Rx / DC Orders ED Discharge Orders     None         Regan Lemming,  MD 07/04/21 [phone]

## 2021-07-03 NOTE — ED Notes (Signed)
One BM with ~100 mL dark red runny stool. ?

## 2021-07-03 NOTE — Progress Notes (Signed)
Patient arrived to the floor, alert and oriented with right chest pain. Cardiac monitoring initiated, patient oriented to staff room and equipment. We'll continue to monitor.  ?

## 2021-07-03 NOTE — ED Triage Notes (Signed)
BIB [facility] from dialysis. Completed full treatment. Used restroom and saw 'a lot' of bright red blood with stool. Right sided CP after dialysis as well. HX left BKA, previous GI. Stays ar [facility]. ? ?EMS vitals ?142/76 ?81YH ?20RR ?90% RA with EMS --> 2 LPM [place] -->100% ?CBG 100 ?

## 2021-07-04 ENCOUNTER — Observation Stay (HOSPITAL_COMMUNITY): Payer: Medicare Other

## 2021-07-04 ENCOUNTER — Encounter (HOSPITAL_COMMUNITY): Payer: Self-pay | Admitting: Internal Medicine

## 2021-07-04 DIAGNOSIS — K2100 Gastro-esophageal reflux disease with esophagitis, without bleeding: Secondary | ICD-10-CM | POA: Diagnosis not present

## 2021-07-04 DIAGNOSIS — I120 Hypertensive chronic kidney disease with stage 5 chronic kidney disease or end stage renal disease: Secondary | ICD-10-CM | POA: Diagnosis not present

## 2021-07-04 DIAGNOSIS — K5791 Diverticulosis of intestine, part unspecified, without perforation or abscess with bleeding: Secondary | ICD-10-CM | POA: Diagnosis present

## 2021-07-04 DIAGNOSIS — K922 Gastrointestinal hemorrhage, unspecified: Secondary | ICD-10-CM | POA: Diagnosis present

## 2021-07-04 DIAGNOSIS — R9431 Abnormal electrocardiogram [ECG] [EKG]: Secondary | ICD-10-CM | POA: Diagnosis not present

## 2021-07-04 DIAGNOSIS — E1151 Type 2 diabetes mellitus with diabetic peripheral angiopathy without gangrene: Secondary | ICD-10-CM | POA: Diagnosis present

## 2021-07-04 DIAGNOSIS — Z20822 Contact with and (suspected) exposure to covid-19: Secondary | ICD-10-CM | POA: Diagnosis present

## 2021-07-04 DIAGNOSIS — F17210 Nicotine dependence, cigarettes, uncomplicated: Secondary | ICD-10-CM | POA: Diagnosis present

## 2021-07-04 DIAGNOSIS — I70221 Atherosclerosis of native arteries of extremities with rest pain, right leg: Secondary | ICD-10-CM | POA: Diagnosis not present

## 2021-07-04 DIAGNOSIS — Y835 Amputation of limb(s) as the cause of abnormal reaction of the patient, or of later complication, without mention of misadventure at the time of the procedure: Secondary | ICD-10-CM | POA: Diagnosis present

## 2021-07-04 DIAGNOSIS — K3189 Other diseases of stomach and duodenum: Secondary | ICD-10-CM | POA: Diagnosis not present

## 2021-07-04 DIAGNOSIS — K8020 Calculus of gallbladder without cholecystitis without obstruction: Secondary | ICD-10-CM | POA: Diagnosis present

## 2021-07-04 DIAGNOSIS — N186 End stage renal disease: Secondary | ICD-10-CM | POA: Diagnosis present

## 2021-07-04 DIAGNOSIS — I70203 Unspecified atherosclerosis of native arteries of extremities, bilateral legs: Secondary | ICD-10-CM | POA: Diagnosis present

## 2021-07-04 DIAGNOSIS — R0781 Pleurodynia: Secondary | ICD-10-CM | POA: Diagnosis present

## 2021-07-04 DIAGNOSIS — M79604 Pain in right leg: Secondary | ICD-10-CM | POA: Diagnosis present

## 2021-07-04 DIAGNOSIS — K219 Gastro-esophageal reflux disease without esophagitis: Secondary | ICD-10-CM | POA: Diagnosis present

## 2021-07-04 DIAGNOSIS — I10 Essential (primary) hypertension: Secondary | ICD-10-CM | POA: Diagnosis not present

## 2021-07-04 DIAGNOSIS — K921 Melena: Secondary | ICD-10-CM | POA: Diagnosis present

## 2021-07-04 DIAGNOSIS — D500 Iron deficiency anemia secondary to blood loss (chronic): Secondary | ICD-10-CM | POA: Diagnosis present

## 2021-07-04 DIAGNOSIS — E669 Obesity, unspecified: Secondary | ICD-10-CM | POA: Diagnosis present

## 2021-07-04 DIAGNOSIS — I70223 Atherosclerosis of native arteries of extremities with rest pain, bilateral legs: Secondary | ICD-10-CM | POA: Diagnosis not present

## 2021-07-04 DIAGNOSIS — Z79899 Other long term (current) drug therapy: Secondary | ICD-10-CM | POA: Diagnosis not present

## 2021-07-04 DIAGNOSIS — L89312 Pressure ulcer of right buttock, stage 2: Secondary | ICD-10-CM | POA: Diagnosis present

## 2021-07-04 DIAGNOSIS — I998 Other disorder of circulatory system: Secondary | ICD-10-CM | POA: Diagnosis not present

## 2021-07-04 DIAGNOSIS — E1122 Type 2 diabetes mellitus with diabetic chronic kidney disease: Secondary | ICD-10-CM | POA: Diagnosis present

## 2021-07-04 DIAGNOSIS — Z89512 Acquired absence of left leg below knee: Secondary | ICD-10-CM | POA: Diagnosis not present

## 2021-07-04 DIAGNOSIS — L89322 Pressure ulcer of left buttock, stage 2: Secondary | ICD-10-CM | POA: Diagnosis present

## 2021-07-04 DIAGNOSIS — T8781 Dehiscence of amputation stump: Secondary | ICD-10-CM | POA: Diagnosis present

## 2021-07-04 DIAGNOSIS — M79671 Pain in right foot: Secondary | ICD-10-CM | POA: Diagnosis not present

## 2021-07-04 DIAGNOSIS — K5521 Angiodysplasia of colon with hemorrhage: Secondary | ICD-10-CM | POA: Diagnosis not present

## 2021-07-04 DIAGNOSIS — M898X9 Other specified disorders of bone, unspecified site: Secondary | ICD-10-CM | POA: Diagnosis present

## 2021-07-04 DIAGNOSIS — Z992 Dependence on renal dialysis: Secondary | ICD-10-CM | POA: Diagnosis not present

## 2021-07-04 DIAGNOSIS — K31811 Angiodysplasia of stomach and duodenum with bleeding: Secondary | ICD-10-CM | POA: Diagnosis present

## 2021-07-04 DIAGNOSIS — D491 Neoplasm of unspecified behavior of respiratory system: Secondary | ICD-10-CM | POA: Diagnosis present

## 2021-07-04 DIAGNOSIS — K269 Duodenal ulcer, unspecified as acute or chronic, without hemorrhage or perforation: Secondary | ICD-10-CM | POA: Diagnosis present

## 2021-07-04 LAB — IRON AND TIBC
Iron: 159 ug/dL (ref 45–182)
Saturation Ratios: 44 % — ABNORMAL HIGH (ref 17.9–39.5)
TIBC: 361 ug/dL (ref 250–450)
UIBC: 202 ug/dL

## 2021-07-04 LAB — COMPREHENSIVE METABOLIC PANEL
ALT: 16 U/L (ref 0–44)
AST: 51 U/L — ABNORMAL HIGH (ref 15–41)
Albumin: 2.5 g/dL — ABNORMAL LOW (ref 3.5–5.0)
Alkaline Phosphatase: 59 U/L (ref 38–126)
Anion gap: 17 — ABNORMAL HIGH (ref 5–15)
BUN: 12 mg/dL (ref 8–23)
CO2: 29 mmol/L (ref 22–32)
Calcium: 9.5 mg/dL (ref 8.9–10.3)
Chloride: 95 mmol/L — ABNORMAL LOW (ref 98–111)
Creatinine, Ser: 5.13 mg/dL — ABNORMAL HIGH (ref 0.61–1.24)
GFR, Estimated: 12 mL/min — ABNORMAL LOW (ref >60)
Glucose, Bld: 86 mg/dL (ref 70–99)
Potassium: 3.6 mmol/L (ref 3.5–5.1)
Sodium: 141 mmol/L (ref 135–145)
Total Bilirubin: 0.3 mg/dL (ref 0.3–1.2)
Total Protein: 8.1 g/dL (ref 6.5–8.1)

## 2021-07-04 LAB — CBC WITH DIFFERENTIAL/PLATELET
Abs Immature Granulocytes: 0.04 10*3/uL (ref 0.00–0.07)
Basophils Absolute: 0 10*3/uL (ref 0.0–0.1)
Basophils Relative: 0 %
Eosinophils Absolute: 0.2 10*3/uL (ref 0.0–0.5)
Eosinophils Relative: 4 %
HCT: 24 % — ABNORMAL LOW (ref 39.0–52.0)
Hemoglobin: 7.9 g/dL — ABNORMAL LOW (ref 13.0–17.0)
Immature Granulocytes: 1 %
Lymphocytes Relative: 34 %
Lymphs Abs: 1.9 10*3/uL (ref 0.7–4.0)
MCH: 34.5 pg — ABNORMAL HIGH (ref 26.0–34.0)
MCHC: 32.9 g/dL (ref 30.0–36.0)
MCV: 104.8 fL — ABNORMAL HIGH (ref 80.0–100.0)
Monocytes Absolute: 0.7 10*3/uL (ref 0.1–1.0)
Monocytes Relative: 13 %
Neutro Abs: 2.6 10*3/uL (ref 1.7–7.7)
Neutrophils Relative %: 48 %
Platelets: 161 10*3/uL (ref 150–400)
RBC: 2.29 MIL/uL — ABNORMAL LOW (ref 4.22–5.81)
RDW: 17.5 % — ABNORMAL HIGH (ref 11.5–15.5)
WBC: 5.5 10*3/uL (ref 4.0–10.5)
nRBC: 1.3 % — ABNORMAL HIGH (ref 0.0–0.2)

## 2021-07-04 LAB — PROTIME-INR
INR: 1.1 (ref 0.8–1.2)
Prothrombin Time: 14.3 seconds (ref 11.4–15.2)

## 2021-07-04 LAB — FOLATE: Folate: 11.9 ng/mL (ref >5.9)

## 2021-07-04 LAB — HEMOGLOBIN AND HEMATOCRIT, BLOOD
HCT: 22.6 % — ABNORMAL LOW (ref 39.0–52.0)
HCT: 22.6 % — ABNORMAL LOW (ref 39.0–52.0)
HCT: 23.2 % — ABNORMAL LOW (ref 39.0–52.0)
Hemoglobin: 7.4 g/dL — ABNORMAL LOW (ref 13.0–17.0)
Hemoglobin: 7.6 g/dL — ABNORMAL LOW (ref 13.0–17.0)
Hemoglobin: 7.3 g/dL — ABNORMAL LOW (ref 13.0–17.0)

## 2021-07-04 LAB — FERRITIN: Ferritin: 218 ng/mL (ref 24–336)

## 2021-07-04 LAB — RETICULOCYTES
Immature Retic Fract: 40.3 % — ABNORMAL HIGH (ref 2.3–15.9)
RBC.: 2.15 MIL/uL — ABNORMAL LOW (ref 4.22–5.81)
Retic Count, Absolute: 118.5 10*3/uL (ref 19.0–186.0)
Retic Ct Pct: 5.5 % — ABNORMAL HIGH (ref 0.4–3.1)

## 2021-07-04 LAB — MAGNESIUM: Magnesium: 2 mg/dL (ref 1.7–2.4)

## 2021-07-04 MED ORDER — GABAPENTIN 100 MG PO CAPS
100.0000 mg | ORAL_CAPSULE | Freq: Three times a day (TID) | ORAL | Status: DC
  Administered 2021-07-04 – 2021-07-10 (×20): 100 mg via ORAL
  Filled 2021-07-04 (×20): qty 1

## 2021-07-04 MED ORDER — HYDROCORTISONE 1 % EX CREA
TOPICAL_CREAM | Freq: Two times a day (BID) | CUTANEOUS | Status: DC
  Filled 2021-07-04: qty 28

## 2021-07-04 MED ORDER — PANTOPRAZOLE SODIUM 40 MG PO TBEC
40.0000 mg | DELAYED_RELEASE_TABLET | Freq: Two times a day (BID) | ORAL | Status: DC
  Administered 2021-07-04 – 2021-07-10 (×13): 40 mg via ORAL
  Filled 2021-07-04 (×13): qty 1

## 2021-07-04 MED ORDER — CLONIDINE HCL 0.1 MG PO TABS
0.1000 mg | ORAL_TABLET | ORAL | Status: DC
  Administered 2021-07-05 – 2021-07-08 (×2): 0.1 mg via ORAL
  Filled 2021-07-04 (×2): qty 1

## 2021-07-04 MED ORDER — IOHEXOL 350 MG/ML SOLN
100.0000 mL | Freq: Once | INTRAVENOUS | Status: AC | PRN
  Administered 2021-07-04: 09:00:00 100 mL via INTRAVENOUS

## 2021-07-04 MED ORDER — AMLODIPINE BESYLATE 10 MG PO TABS
10.0000 mg | ORAL_TABLET | Freq: Every day | ORAL | Status: DC
  Administered 2021-07-04 – 2021-07-09 (×6): 10 mg via ORAL
  Filled 2021-07-04 (×6): qty 1

## 2021-07-04 MED ORDER — OXYCODONE-ACETAMINOPHEN 5-325 MG PO TABS
1.0000 | ORAL_TABLET | Freq: Once | ORAL | Status: AC
  Administered 2021-07-04: 07:00:00 1 via ORAL
  Filled 2021-07-04: qty 1

## 2021-07-04 MED ORDER — CHLORHEXIDINE GLUCONATE CLOTH 2 % EX PADS
6.0000 | MEDICATED_PAD | Freq: Every day | CUTANEOUS | Status: DC
  Administered 2021-07-05 – 2021-07-10 (×6): 6 via TOPICAL

## 2021-07-04 MED ORDER — OXYCODONE-ACETAMINOPHEN 5-325 MG PO TABS
1.0000 | ORAL_TABLET | Freq: Four times a day (QID) | ORAL | Status: DC | PRN
  Administered 2021-07-04 – 2021-07-06 (×6): 1 via ORAL
  Filled 2021-07-04 (×6): qty 1

## 2021-07-04 MED ORDER — HYDROXYZINE HCL 25 MG PO TABS: 25.0000 mg | ORAL_TABLET | Freq: Four times a day (QID) | ORAL | Status: DC | PRN

## 2021-07-04 MED ORDER — CLONIDINE HCL 0.1 MG PO TABS
0.1000 mg | ORAL_TABLET | ORAL | Status: DC
Start: 2021-07-04 — End: 2021-07-10
  Administered 2021-07-04 – 2021-07-07 (×5): 0.1 mg via ORAL
  Filled 2021-07-04 (×5): qty 1

## 2021-07-04 NOTE — Progress Notes (Signed)
HOSPITAL MEDICINE OVERNIGHT EVENT NOTE   ? ?Nursing has notified me that the patient has multiple complaints. ? ?First, patient is complaining of severe right-sided chest pain.  The pain has been constant for over 2 weeks which the patient has been managing in the outpatient setting with a Lidoderm patch.  Of note, patient had an EKG on admission that revealed no acute ST segment change with 2 sets of troponins that were unremarkable.  This chest pain is likely musculoskeletal. ? ?Patient is additionally complaining of severe left leg pain at the site of his previous BKA.  Patient received several doses of intravenous opiate based analgesics in the emergency department.  And continues to complain of 10 out of 10 pain at the site with no apparent abnormality. ? ?I am concerned that much of this pain is chronic and therefore we will only provide the patient a one-time order for Percocet and have advised nursing that any further opiate based analgesics will need to be discussed with the day team. ? ?Vernelle Emerald  MD ?[facility]  ? ? ? ? ? ? ? ? ? ? ?

## 2021-07-04 NOTE — H&P (View-Only) (Signed)
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  The patient states that he had a bout of melena during dialysis.  Over the past three years his HGB baseline was in the 7-8 range.  Before 12/2018 he had values from 12-14 g/dL.  Over the past two years from 06/2019 to the current admission the patient under went multiple endoscopic evaluations for IDA and melena.  His colonoscopy on 06/30/2019 with Dr. Cristina Gong showed that he had diverticula.  On four occassions last year was identified to have several actively bleeding sites.  All the areas were treated.   Past Medical History:  Diagnosis Date   Anemia    Diabetes mellitus without complication [facility])    patient denies   Dialysis patient [facility])    End stage chronic kidney disease ([place])    Hypertension    ICH (intracerebral hemorrhage) ([place]) 05/20/2017   PAD (peripheral artery disease) ([facility])    Shoulder pain, left 06/28/2013    Past Surgical History:  Procedure Laterality Date   A/V FISTULAGRAM N/A 08/15/2020   Procedure: A/V FISTULAGRAM - Left Upper;  Surgeon: Cherre Robins, MD;  Location: [place] [facility];  Service: Cardiovascular;  Laterality: N/A;   ABDOMINAL AORTOGRAM W/LOWER EXTREMITY N/A 04/08/2021   Procedure: ABDOMINAL AORTOGRAM W/LOWER EXTREMITY;  Surgeon: Waynetta Sandy, MD;  Location: [place] [facility];  Service: Cardiovascular;  Laterality: N/A;   AMPUTATION Left 04/16/2021   Procedure: LEFT BELOW KNEE AMPUTATION;  Surgeon: Angelia Mould, MD;  Location: [place];  Service: Vascular;  Laterality: Left;   AMPUTATION Left 06/17/2021   Procedure: REVISION AMPUTATION BELOW KNEE;  Surgeon: Broadus John, MD;  Location: [place];  Service: Vascular;  Laterality: Left;   APPENDECTOMY      APPLICATION OF WOUND VAC Left 06/17/2021   Procedure: APPLICATION OF WOUND VAC;  Surgeon: Broadus John, MD;  Location: [place];  Service: Vascular;  Laterality: Left;   AV FISTULA PLACEMENT Left 08/03/2019   Procedure: LEFT ARM ARTERIOVENOUS (AV) CEPHALIC  FISTULA CREATION;  Surgeon: Waynetta Sandy, MD;  Location: [place];  Service: Vascular;  Laterality: Left;   BIOPSY  06/30/2019   Procedure: BIOPSY;  Surgeon: Ronald Lobo, MD;  Location: [facility] ENDOSCOPY;  Service: Endoscopy;;   BIOPSY  08/02/2019   Procedure: BIOPSY;  Surgeon: Yetta Flock, MD;  Location: [facility] ENDOSCOPY;  Service: Gastroenterology;;   BIOPSY  02/08/2021   Procedure: BIOPSY;  Surgeon: Sharyn Creamer, MD;  Location: [facility] ENDOSCOPY;  Service: Gastroenterology;;   BIOPSY  02/24/2021   Procedure: BIOPSY;  Surgeon: [name], MD;  Location: [facility] ENDOSCOPY;  Service: Gastroenterology;;   BIOPSY  03/26/2021   Procedure: BIOPSY;  Surgeon: Lavena Bullion, DO;  Location: [place];  Service: Gastroenterology;;   COLONOSCOPY  01/23/2012   Procedure: COLONOSCOPY;  Surgeon: Danie Binder, MD;  [facility];  Service: Endoscopy;  Laterality: N/A;  11:10 AM   COLONOSCOPY WITH PROPOFOL N/A 06/30/2019   Procedure: COLONOSCOPY WITH PROPOFOL;  Surgeon: Ronald Lobo, MD;  Location: [place];  Service: Endoscopy;  Laterality: N/A;   ENTEROSCOPY N/A 08/02/2019   Procedure: ENTEROSCOPY;  Surgeon: Yetta Flock, MD;  Location: [facility] ENDOSCOPY;  Service: Gastroenterology;  Laterality: N/A;   ENTEROSCOPY N/A 02/24/2021  Procedure: ENTEROSCOPY;  Surgeon: [name], MD;  Location: [place];  Service: Gastroenterology;  Laterality: N/A;   ENTEROSCOPY N/A 03/26/2021   Procedure: ENTEROSCOPY;  Surgeon: Lavena Bullion, DO;  Location: [place];  Service: Gastroenterology;  Laterality: N/A;   ESOPHAGOGASTRODUODENOSCOPY N/A 08/10/2020   Procedure: ESOPHAGOGASTRODUODENOSCOPY (EGD);  Surgeon:  Jerene Bears, MD;  Location: [facility] ENDOSCOPY;  Service: Gastroenterology;  Laterality: N/A;   ESOPHAGOGASTRODUODENOSCOPY (EGD) WITH PROPOFOL N/A 06/30/2019   Procedure: ESOPHAGOGASTRODUODENOSCOPY (EGD) WITH PROPOFOL;  Surgeon: Ronald Lobo, MD;  Location: [place];  Service: Endoscopy;  Laterality: N/A;   ESOPHAGOGASTRODUODENOSCOPY (EGD) WITH PROPOFOL N/A 01/12/2021   Procedure: ESOPHAGOGASTRODUODENOSCOPY (EGD) WITH PROPOFOL;  Surgeon: Sharyn Creamer, MD;  Location: [place];  Service: Gastroenterology;  Laterality: N/A;   ESOPHAGOGASTRODUODENOSCOPY (EGD) WITH PROPOFOL N/A 02/08/2021   Procedure: ESOPHAGOGASTRODUODENOSCOPY (EGD) WITH PROPOFOL;  Surgeon: Sharyn Creamer, MD;  Location: [place];  Service: Gastroenterology;  Laterality: N/A;   FISTULA SUPERFICIALIZATION Left 10/17/2019   Procedure: LEFT UPPER EXTREMITY FISTULA REVISION, SIDE BRANCH LIGATION,  AND SUPERFICIALIZATION;  Surgeon: Marty Heck, MD;  Location: [place];  Service: Vascular;  Laterality: Left;   FISTULA SUPERFICIALIZATION Left 50/53/9767   Procedure: PLICATION OF ANEURYSM LEFT ARTERIOVENOUS FISTULA;  Surgeon: Angelia Mould, MD;  Location: [place];  Service: Vascular;  Laterality: Left;   GIVENS CAPSULE STUDY N/A 06/30/2019   Procedure: GIVENS CAPSULE STUDY;  Surgeon: Ronald Lobo, MD;  Location: [place];  Service: Endoscopy;  Laterality: N/A;   HEMOSTASIS CLIP PLACEMENT  08/10/2020   Procedure: HEMOSTASIS CLIP PLACEMENT;  Surgeon: Jerene Bears, MD;  Location: [place];  Service: Gastroenterology;;   HEMOSTASIS CLIP PLACEMENT  01/12/2021   Procedure: HEMOSTASIS CLIP PLACEMENT;  Surgeon: Sharyn Creamer, MD;  Location: [place];  Service: Gastroenterology;;   HEMOSTASIS CONTROL  08/02/2019   Procedure: HEMOSTASIS CONTROL;  Surgeon: Yetta Flock, MD;  Location: [place];  Service: Gastroenterology;;   HOT HEMOSTASIS  02/24/2021   Procedure: HOT HEMOSTASIS (ARGON PLASMA  COAGULATION/BICAP);  Surgeon: [name], MD;  Location: [facility] ENDOSCOPY;  Service: Gastroenterology;;   HOT HEMOSTASIS N/A 03/26/2021   Procedure: HOT HEMOSTASIS (ARGON PLASMA COAGULATION/BICAP);  Surgeon: Lavena Bullion, DO;  Location: [facility] ENDOSCOPY;  Service: Gastroenterology;  Laterality: N/A;   INCISION AND DRAINAGE ABSCESS N/A 06/29/2016   Procedure: INCISION AND DRAINAGE ABDOMINAL WALL ABSCESS;  Surgeon: Alphonsa Overall, MD;  Location: [facility];  Service: General;  Laterality: N/A;   INSERTION OF DIALYSIS CATHETER Right 08/03/2019   Procedure: INSERTION OF DIALYSIS CATHETER;  Surgeon: Waynetta Sandy, MD;  Location: [place];  Service: Vascular;  Laterality: Right;   INSERTION OF DIALYSIS CATHETER Right 10/22/2019   Procedure: INSERTION OF 23CM TUNNELED DIALYSIS CATHETER RIGHT INTERNAL JUGULAR;  Surgeon: Angelia Mould, MD;  Location: [place];  Service: Vascular;  Laterality: Right;   INSERTION OF DIALYSIS CATHETER Right 08/12/2020   Procedure: INSERTION OF Right internal Jugular TUNNELED  DIALYSIS CATHETER.;  Surgeon: Waynetta Sandy, MD;  Location: [place];  Service: Vascular;  Laterality: Right;   INSERTION OF DIALYSIS CATHETER N/A 04/19/2021   Procedure: INSERTION OF TUNNELED DIALYSIS CATHETER;  Surgeon: Angelia Mould, MD;  Location: [place];  Service: Vascular;  Laterality: N/A;   Left heel surgery     PENILE BIOPSY N/A 03/26/2020   Procedure: PENILE ULCER DEBRIDEMENT;  Surgeon: Remi Haggard, MD;  Location: [facility];  Service: Urology;  Laterality: N/A;  [place]  01/12/2021   Procedure: Clide Deutscher;  Surgeon: Sharyn Creamer, MD;  Location: [facility] ENDOSCOPY;  Service: Gastroenterology;;   Clide Deutscher  02/24/2021   Procedure: Clide Deutscher;  Surgeon: [name], MD;  Location: [facility] ENDOSCOPY;  Service: Gastroenterology;;    Family History  Problem Relation Age of Onset   Colon cancer Neg Hx     Social History:  reports that he  has been smoking cigarettes. He has a 22.50 pack-year smoking history. He has never used smokeless tobacco. He reports that he does not currently use alcohol. He reports that he does not currently use drugs after having used the following drugs: "Crack" cocaine.  Allergies:  Allergies  Allergen Reactions   Seroquel [Quetiapine] Other (See Comments)    Tardive kinesia/dystonia   Dilaudid [Hydromorphone Hcl] Itching and Other (See Comments)    Pt reports itchiness after IM injection     Medications: Scheduled:  amLODipine  10 mg Oral QHS   [START ON 07/05/2021] cloNIDine  0.1 mg Oral Q M,W,F-2000   cloNIDine  0.1 mg Oral 2 times per day on Sun Tue Thu Sat   gabapentin  100 mg Oral TID   lidocaine  1 patch Transdermal Q24H   pantoprazole  40 mg Oral BID   Continuous:  Results for orders placed or performed during the hospital encounter of 07/03/21 (from the past 24 hour(s))  Comprehensive metabolic panel     Status: Abnormal   Collection Time: 07/03/21  5:00 PM  Result Value Ref Range   Sodium 140 135 - 145 mmol/L   Potassium 4.9 3.5 - 5.1 mmol/L   Chloride 95 (L) 98 - 111 mmol/L   CO2 27 22 - 32 mmol/L   Glucose, Bld 94 70 - 99 mg/dL   BUN 11 8 - 23 mg/dL   Creatinine, Ser 4.25 (H) 0.61 - 1.24 mg/dL   Calcium 8.8 (L) 8.9 - 10.3 mg/dL   Total Protein 7.7 6.5 - 8.1 g/dL   Albumin 2.6 (L) 3.5 - 5.0 g/dL   AST 82 (H) 15 - 41 U/L   ALT 16 0 - 44 U/L   Alkaline Phosphatase 64 38 - 126 U/L   Total Bilirubin 2.0 (H) 0.3 - 1.2 mg/dL   GFR, Estimated 15 (L) >60 mL/min   Anion gap 18 (H) 5 - 15  CBC WITH DIFFERENTIAL     Status: Abnormal   Collection Time: 07/03/21  5:00 PM  Result Value Ref Range   WBC 4.4 4.0 - 10.5 K/uL   RBC 2.34 (L) 4.22 - 5.81 MIL/uL   Hemoglobin 8.0 (L) 13.0 - 17.0 g/dL   HCT 25.3 (L) 39.0 - 52.0 %   MCV 108.1 (H) 80.0 - 100.0 fL   MCH 34.2 (H) 26.0 - 34.0 pg   MCHC 31.6 30.0 - 36.0 g/dL   RDW 17.8 (H) 11.5 - 15.5 %   Platelets 173 150 - 400 K/uL   nRBC  1.1 (H) 0.0 - 0.2 %   Neutrophils Relative % 50 %   Neutro Abs 2.2 1.7 - 7.7 K/uL   Lymphocytes Relative 33 %   Lymphs Abs 1.4 0.7 - 4.0 K/uL   Monocytes Relative 12 %   Monocytes Absolute 0.5 0.1 - 1.0 K/uL   Eosinophils Relative 3 %   Eosinophils Absolute 0.1 0.0 - 0.5 K/uL   Basophils Relative 1 %   Basophils Absolute 0.0 0.0 - 0.1 K/uL   Immature Granulocytes 1 %   Abs Immature Granulocytes 0.05 0.00 - 0.07  K/uL  Protime-INR     Status: None   Collection Time: 07/03/21  5:00 PM  Result Value Ref Range   Prothrombin Time 13.7 11.4 - 15.2 seconds   INR 1.1 0.8 - 1.2  Troponin I (High Sensitivity)     Status: None   Collection Time: 07/03/21  5:00 PM  Result Value Ref Range   Troponin I (High Sensitivity) 11 <18 ng/L  D-dimer, quantitative     Status: Abnormal   Collection Time: 07/03/21  5:00 PM  Result Value Ref Range   D-Dimer, Quant 2.70 (H) 0.00 - 0.50 ug/mL-FEU  Type and screen [place]     Status: None   Collection Time: 07/03/21  5:44 PM  Result Value Ref Range   ABO/RH(D) B POS    Antibody Screen NEG    Sample Expiration      07/06/2021,2359 Performed at [facility], [place] [address] [place], [place] [address]   Troponin I (High Sensitivity)     Status: None   Collection Time: 07/03/21  7:33 PM  Result Value Ref Range   Troponin I (High Sensitivity) 10 <18 ng/L  POC occult blood, ED     Status: Abnormal   Collection Time: 07/03/21  7:49 PM  Result Value Ref Range   Fecal Occult Bld POSITIVE (A) NEGATIVE  Resp Panel by RT-PCR (Flu A&B, Covid) Nasopharyngeal Swab     Status: None   Collection Time: 07/03/21  9:07 PM   Specimen: Nasopharyngeal Swab; Nasopharyngeal(NP) swabs in vial transport medium  Result Value Ref Range   SARS Coronavirus 2 by RT PCR NEGATIVE NEGATIVE   Influenza A by PCR NEGATIVE NEGATIVE   Influenza B by PCR NEGATIVE NEGATIVE  Hemoglobin and hematocrit, blood     Status: Abnormal   Collection Time: 07/03/21  9:44  PM  Result Value Ref Range   Hemoglobin 7.6 (L) 13.0 - 17.0 g/dL   HCT 23.8 (L) 39.0 - 52.0 %  CBC with Differential/Platelet     Status: Abnormal   Collection Time: 07/04/21  1:43 AM  Result Value Ref Range   WBC 5.5 4.0 - 10.5 K/uL   RBC 2.29 (L) 4.22 - 5.81 MIL/uL   Hemoglobin 7.9 (L) 13.0 - 17.0 g/dL   HCT 24.0 (L) 39.0 - 52.0 %   MCV 104.8 (H) 80.0 - 100.0 fL   MCH 34.5 (H) 26.0 - 34.0 pg   MCHC 32.9 30.0 - 36.0 g/dL   RDW 17.5 (H) 11.5 - 15.5 %   Platelets 161 150 - 400 K/uL   nRBC 1.3 (H) 0.0 - 0.2 %   Neutrophils Relative % 48 %   Neutro Abs 2.6 1.7 - 7.7 K/uL   Lymphocytes Relative 34 %   Lymphs Abs 1.9 0.7 - 4.0 K/uL   Monocytes Relative 13 %   Monocytes Absolute 0.7 0.1 - 1.0 K/uL   Eosinophils Relative 4 %   Eosinophils Absolute 0.2 0.0 - 0.5 K/uL   Basophils Relative 0 %   Basophils Absolute 0.0 0.0 - 0.1 K/uL   Immature Granulocytes 1 %   Abs Immature Granulocytes 0.04 0.00 - 0.07 K/uL  Comprehensive metabolic panel     Status: Abnormal   Collection Time: 07/04/21  1:43 AM  Result Value Ref Range   Sodium 141 135 - 145 mmol/L   Potassium 3.6 3.5 - 5.1 mmol/L   Chloride 95 (L) 98 - 111 mmol/L   CO2 29 22 - 32 mmol/L  Glucose, Bld 86 70 - 99 mg/dL   BUN 12 8 - 23 mg/dL   Creatinine, Ser 5.13 (H) 0.61 - 1.24 mg/dL   Calcium 9.5 8.9 - 10.3 mg/dL   Total Protein 8.1 6.5 - 8.1 g/dL   Albumin 2.5 (L) 3.5 - 5.0 g/dL   AST 51 (H) 15 - 41 U/L   ALT 16 0 - 44 U/L   Alkaline Phosphatase 59 38 - 126 U/L   Total Bilirubin 0.3 0.3 - 1.2 mg/dL   GFR, Estimated 12 (L) >60 mL/min   Anion gap 17 (H) 5 - 15  Magnesium     Status: None   Collection Time: 07/04/21  1:43 AM  Result Value Ref Range   Magnesium 2.0 1.7 - 2.4 mg/dL  Ferritin     Status: None   Collection Time: 07/04/21  5:54 AM  Result Value Ref Range   Ferritin 218 24 - 336 ng/mL  Folate     Status: None   Collection Time: 07/04/21  5:54 AM  Result Value Ref Range   Folate 11.9 >5.9 ng/mL   Reticulocytes     Status: Abnormal   Collection Time: 07/04/21  5:54 AM  Result Value Ref Range   Retic Ct Pct 5.5 (H) 0.4 - 3.1 %   RBC. 2.15 (L) 4.22 - 5.81 MIL/uL   Retic Count, Absolute 118.5 19.0 - 186.0 K/uL   Immature Retic Fract 40.3 (H) 2.3 - 15.9 %  Iron and TIBC     Status: Abnormal   Collection Time: 07/04/21  5:54 AM  Result Value Ref Range   Iron 159 45 - 182 ug/dL   TIBC 361 250 - 450 ug/dL   Saturation Ratios 44 (H) 17.9 - 39.5 %   UIBC 202 ug/dL  Protime-INR     Status: None   Collection Time: 07/04/21  5:54 AM  Result Value Ref Range   Prothrombin Time 14.3 11.4 - 15.2 seconds   INR 1.1 0.8 - 1.2  Hemoglobin and hematocrit, blood     Status: Abnormal   Collection Time: 07/04/21  9:06 AM  Result Value Ref Range   Hemoglobin 7.4 (L) 13.0 - 17.0 g/dL   HCT 22.6 (L) 39.0 - 52.0 %     CT Angio Chest Pulmonary Embolism (PE) W or WO Contrast  Result Date: 07/04/2021 CLINICAL DATA:  Chest pain or SOB, pleurisy or effusion suspected EXAM: CT ANGIOGRAPHY CHEST WITH CONTRAST TECHNIQUE: Multidetector CT imaging of the chest was performed using the standard protocol during bolus administration of intravenous contrast. Multiplanar CT image reconstructions and MIPs were obtained to evaluate the vascular anatomy. RADIATION DOSE REDUCTION: This exam was performed according to the departmental dose-optimization program which includes automated exposure control, adjustment of the mA and/or kV according to patient size and/or use of iterative reconstruction technique. CONTRAST:  152m OMNIPAQUE IOHEXOL 350 MG/ML SOLN COMPARISON:  X-ray 07/03/2021, CT 06/29/2019 FINDINGS: Cardiovascular: Satisfactory opacification of the pulmonary arteries to the segmental level. Evaluation of the distal pulmonary arterial branches is degraded by respiratory motion artifact and beam hardening artifact. No evidence of pulmonary embolism to the lobar branch level. Thoracic aorta is nonaneurysmal.  Atherosclerotic calcifications of the aorta and coronary arteries. Stable cardiomegaly. No pericardial effusion. Mediastinum/Nodes: Mildly prominent precarinal lymph node measuring 11 mm short axis (series 6, image 92). Right hilar lymph node measures 13 mm short axis (series 6, image 115). No axillary lymphadenopathy. Trachea, thyroid gland, and esophagus appear within normal limits. Lungs/Pleura: Low lung volumes  with respiratory motion artifact. Rounded 2.2 cm subsolid density within the superior aspect of the right lower lobe (series 7, image 62) was present on the previous CT from 2021 however demonstrates increased solid component on the current exam. Interlobular septal thickening. Small bilateral pleural effusions with bibasilar atelectasis. Upper Abdomen: No acute abnormality. Musculoskeletal: No chest wall abnormality. No acute or significant osseous findings. Review of the MIP images confirms the above findings. IMPRESSION: 1. No evidence of pulmonary embolism to the lobar branch level. 2. Cardiomegaly with evidence of interstitial pulmonary edema including small bilateral pleural effusions and bibasilar atelectasis. 3. Rounded 2.2 cm subsolid density within the superior aspect of the right lower lobe was present on the previous CT from 2021 however demonstrates increased solid component on the current exam. Adenocarcinoma is considered. Thoracic surgery consultation is recommended. PET/CT should be considered for staging purposes. These recommendations are taken from: Recommendations for the Management of Subsolid Pulmonary Nodules Detected at CT: A Statement from the [facility] 2013; 266:1, 304-317. 4. Mildly prominent mediastinal and right hilar lymph nodes, nonspecific, but may be reactive. 5. Aortic Atherosclerosis (ICD10-I70.0). Electronically Signed   By: Davina Poke D.O.   On: 07/04/2021 08:55   CT Angio Aortobifemoral W and/or Wo Contrast  Result Date:  07/03/2021 CLINICAL DATA:  History of left BKA due to known peripheral vascular disease with right leg pain, initial encounter EXAM: CT ANGIOGRAPHY OF ABDOMINAL AORTA WITH ILIOFEMORAL RUNOFF TECHNIQUE: Multidetector CT imaging of the abdomen, pelvis and lower extremities was performed using the standard protocol during bolus administration of intravenous contrast. Multiplanar CT image reconstructions and MIPs were obtained to evaluate the vascular anatomy. RADIATION DOSE REDUCTION: This exam was performed according to the departmental dose-optimization program which includes automated exposure control, adjustment of the mA and/or kV according to patient size and/or use of iterative reconstruction technique. CONTRAST:  119m OMNIPAQUE IOHEXOL 350 MG/ML SOLN COMPARISON:  03/19/2021 CT, catheter based angiography from 04/08/21 FINDINGS: VASCULAR Aorta: Atherosclerotic calcifications are noted without aneurysmal dilatation or dissection. Celiac: Atherosclerotic calcifications are noted. No focal stenosis or aneurysmal dilatation is noted. SMA: Patent without evidence of aneurysm, dissection, vasculitis or significant stenosis. Renals: Single renal arteries are identified bilaterally with mild atherosclerotic change. IMA: Patent without evidence of aneurysm, dissection, vasculitis or significant stenosis. RIGHT Lower Extremity Inflow: Right iliac artery demonstrates atherosclerotic calcifications. Previously seen stenosis in the external iliac artery is not as well appreciated on today's exam. Runoff: Common femoral artery is calcified. Femoral bifurcation is patent. Superficial femoral artery demonstrates calcifications throughout its course with heavy focal calcification in the mid thigh similar to that seen on the prior exam. Focal stenosis in this area is noted as well. Popliteal artery also demonstrates heavy calcifications with multifocal areas of stenosis similar to that seen on the prior study. Infrapopliteal  vessels demonstrate heavy calcifications limiting evaluation of patency. This is similar to that seen on the prior exam. Recent angiogram obtained in December of 2022 shows the proximal infrapopliteal vessels to be patent. Primary runoff is noted posterior tibial artery. Correlate with Doppler pulses. LEFT Lower Extremity Inflow: Atherosclerotic calcifications of the iliac arteries are seen without focal hemodynamically significant stenosis. Runoff: Common femoral artery is patent as is the femoral bifurcation heavy calcifications are noted in the mid to distal superficial femoral artery with focal stenosis similar to that seen on prior arteriogram. No occlusive changes are seen. The popliteal artery also demonstrates heavy atherosclerotic calcifications. Popliteal trifurcation shows heavy calcifications. Changes of recent BKA  are noted. Veins: No specific venous abnormality is noted. Review of the MIP images confirms the above findings. NON-VASCULAR Lower chest: Minimal right pleural effusion is noted. No focal infiltrate is seen. Hepatobiliary: Liver is within normal limits. Cholelithiasis is noted without complicating factors. Pancreas: Unremarkable. No pancreatic ductal dilatation or surrounding inflammatory changes. Spleen: Normal in size without focal abnormality. Adrenals/Urinary Tract: Adrenal glands are well visualized and within normal limits. Kidneys show nonobstructing stone in the lower pole of the left kidney similar to that seen prior exam. The ureters are within normal limits. The bladder is partially distended. Stomach/Bowel: No obstructive or inflammatory changes of the colon are seen. Mild diverticular changes noted without evidence of diverticulitis. Some contrast is noted in the distal most aspect of the rectum which may represent some hemorrhoid related bleeding. Appendix has been surgically removed. Small bowel and stomach are within normal limits. Lymphatic: No sizable lymphadenopathy is  seen. Reproductive: Prostate is unremarkable. Other: No abdominal wall hernia or abnormality. No abdominopelvic ascites. Musculoskeletal: There are changes consistent with prior below-knee amputation. No bony changes to suggest osteomyelitis are seen. No acute bony abnormality is noted. There are degenerative changes of lumbar spine are seen. IMPRESSION: VASCULAR Heavy vascular calcifications are identified bilaterally with areas of stenosis in the superficial femoral arteries bilaterally similar to that seen on prior angiography from December of 2022. Distal runoff is difficult to evaluate due to heavy calcifications in the infrapopliteal vessels on the right although prior arteriography showed dominant runoff via the right posterior tibial artery. The overall appearance is similar to that seen on the prior exam. Previously described right external iliac artery stenosis is not appreciated on this exam and recent arteriography showed no focal stenosis in that region. NON-VASCULAR Evidence of cholelithiasis similar to that seen on the prior exam. Changes of prior appendectomy. Left BKA without complicating factors. Mild contrast is noted in the distal aspect of the rectum which may be related to hemorrhoidal bleed. Electronically Signed   By: Inez Catalina M.D.   On: 07/03/2021 19:45   DG Chest Portable 1 View  Result Date: 07/03/2021 CLINICAL DATA:  Chest pain. EXAM: PORTABLE CHEST 1 VIEW COMPARISON:  June 13, 2021 FINDINGS: The dialysis catheter has been removed. Tortuosity and calcific atherosclerotic disease of the aorta. Enlarged cardiac silhouette. There is no evidence of focal airspace consolidation, pleural effusion or pneumothorax. Minimal pulmonary vascular congestion. Osseous structures are without acute abnormality. Soft tissues are grossly normal. IMPRESSION: 1. Enlarged cardiac silhouette. 2. Minimal pulmonary vascular congestion. 3. Tortuosity and calcific atherosclerotic disease of the aorta.  Electronically Signed   By: Fidela Salisbury M.D.   On: 07/03/2021 17:57    ROS:  As stated above in the HPI otherwise negative.  Blood pressure 135/84, pulse 78, temperature 98.2 F (36.8 C), temperature source Oral, resp. rate 19, height '5\' 11"'$  (1.803 m), weight 117.9 kg, SpO2 96 %.    PE: Gen: NAD, Alert and Oriented HEENT:  [place]/AT, EOMI Neck: Supple, no LAD Lungs: CTA Bilaterally CV: RRR without M/G/R ABD: Soft, NTND, +BS Ext: No C/C/E  Assessment/Plan: 1) Melena - History of PUD and bleeding AVMs. 2) Chronic anemia. 3) ESRD.   The patient's HGB did not change significantly with the melena, but he has a significant history of bleeding.  It was reported that he had hematochezia, but he specifically states melena.  He has a history of diverticula.  Further evaluation with an enteroscopy and possible FFS will be performed tomorrow.  Plan: 1)  Monitor HGB. 2) Transfuse as necessary. 3) Enteroscopy +/- FFS tomorrow.  Daniel [name] D 07/04/2021, 3:03 PM

## 2021-07-04 NOTE — Consult Note (Signed)
Renal Service Consult Note [facility]  Daniel [name] 07/04/2021 Sol Blazing, MD Requesting Physician: Dr [name]  Reason for Consult: ESRD w/ possible GIB HPI: The patient is a 61 y.o. year-old w/ hx of GIB/ AVMs/ PUD, DM2, ESRD on HD, HTN presents w/ c/o blood in stool.  In eD VVS, BUN 11, WBC 4K, Hb 8.0, INR 1.1. ED did FOB test which was +. Pt admitted. Asked to see for ESRD.    Pt on HD MWF.  No recent problems.  Staying in a SNF.    ROS - denies CP, no joint pain, no HA, no blurry vision, no rash, no diarrhea, no nausea/ vomiting, no dysuria, no difficulty voiding   Past Medical History  Past Medical History:  Diagnosis Date   Anemia    Diabetes mellitus without complication [facility])    patient denies   Dialysis patient [facility])    End stage chronic kidney disease ([place])    Hypertension    ICH (intracerebral hemorrhage) ([place]) 05/20/2017   PAD (peripheral artery disease) ([facility])    Shoulder pain, left 06/28/2013   Past Surgical History  Past Surgical History:  Procedure Laterality Date   A/V FISTULAGRAM N/A 08/15/2020   Procedure: A/V FISTULAGRAM - Left Upper;  Surgeon: Cherre Robins, MD;  Location: [place] CV [facility];  Service: Cardiovascular;  Laterality: N/A;   ABDOMINAL AORTOGRAM W/LOWER EXTREMITY N/A 04/08/2021   Procedure: ABDOMINAL AORTOGRAM W/LOWER EXTREMITY;  Surgeon: Waynetta Sandy, MD;  Location: [place] [facility];  Service: Cardiovascular;  Laterality: N/A;   AMPUTATION Left 04/16/2021   Procedure: LEFT BELOW KNEE AMPUTATION;  Surgeon: Angelia Mould, MD;  Location: [place];  Service: Vascular;  Laterality: Left;   AMPUTATION Left 06/17/2021   Procedure: REVISION AMPUTATION BELOW KNEE;  Surgeon: Broadus John, MD;  Location: [place];  Service: Vascular;  Laterality: Left;   APPENDECTOMY     APPLICATION OF WOUND VAC Left 06/17/2021   Procedure: APPLICATION OF WOUND VAC;  Surgeon: Broadus John, MD;  Location: [place];   Service: Vascular;  Laterality: Left;   AV FISTULA PLACEMENT Left 08/03/2019   Procedure: LEFT ARM ARTERIOVENOUS (AV) CEPHALIC  FISTULA CREATION;  Surgeon: Waynetta Sandy, MD;  Location: [place];  Service: Vascular;  Laterality: Left;   BIOPSY  06/30/2019   Procedure: BIOPSY;  Surgeon: Ronald Lobo, MD;  Location: [facility] ENDOSCOPY;  Service: Endoscopy;;   BIOPSY  08/02/2019   Procedure: BIOPSY;  Surgeon: Yetta Flock, MD;  Location: [facility] ENDOSCOPY;  Service: Gastroenterology;;   BIOPSY  02/08/2021   Procedure: BIOPSY;  Surgeon: Sharyn Creamer, MD;  Location: [facility] ENDOSCOPY;  Service: Gastroenterology;;   BIOPSY  02/24/2021   Procedure: BIOPSY;  Surgeon: [name], MD;  Location: [facility] ENDOSCOPY;  Service: Gastroenterology;;   BIOPSY  03/26/2021   Procedure: BIOPSY;  Surgeon: Lavena Bullion, DO;  Location: [place];  Service: Gastroenterology;;   COLONOSCOPY  01/23/2012   Procedure: COLONOSCOPY;  Surgeon: Danie Binder, MD;  [facility];  Service: Endoscopy;  Laterality: N/A;  11:10 AM   COLONOSCOPY WITH PROPOFOL N/A 06/30/2019   Procedure: COLONOSCOPY WITH PROPOFOL;  Surgeon: Ronald Lobo, MD;  Location: [place];  Service: Endoscopy;  Laterality: N/A;   ENTEROSCOPY N/A 08/02/2019   Procedure: ENTEROSCOPY;  Surgeon: Yetta Flock, MD;  Location: [facility] ENDOSCOPY;  Service: Gastroenterology;  Laterality: N/A;   ENTEROSCOPY N/A 02/24/2021   Procedure: ENTEROSCOPY;  Surgeon: Dustin Flock  E, MD;  Location: [place];  Service: Gastroenterology;  Laterality: N/A;   ENTEROSCOPY N/A 03/26/2021   Procedure: ENTEROSCOPY;  Surgeon: Lavena Bullion, DO;  Location: [place];  Service: Gastroenterology;  Laterality: N/A;   ESOPHAGOGASTRODUODENOSCOPY N/A 08/10/2020   Procedure: ESOPHAGOGASTRODUODENOSCOPY (EGD);  Surgeon: Jerene Bears, MD;  Location: [facility] ENDOSCOPY;  Service: Gastroenterology;  Laterality: N/A;   ESOPHAGOGASTRODUODENOSCOPY (EGD) WITH  PROPOFOL N/A 06/30/2019   Procedure: ESOPHAGOGASTRODUODENOSCOPY (EGD) WITH PROPOFOL;  Surgeon: Ronald Lobo, MD;  Location: [place];  Service: Endoscopy;  Laterality: N/A;   ESOPHAGOGASTRODUODENOSCOPY (EGD) WITH PROPOFOL N/A 01/12/2021   Procedure: ESOPHAGOGASTRODUODENOSCOPY (EGD) WITH PROPOFOL;  Surgeon: Sharyn Creamer, MD;  Location: [place];  Service: Gastroenterology;  Laterality: N/A;   ESOPHAGOGASTRODUODENOSCOPY (EGD) WITH PROPOFOL N/A 02/08/2021   Procedure: ESOPHAGOGASTRODUODENOSCOPY (EGD) WITH PROPOFOL;  Surgeon: Sharyn Creamer, MD;  Location: [place];  Service: Gastroenterology;  Laterality: N/A;   FISTULA SUPERFICIALIZATION Left 10/17/2019   Procedure: LEFT UPPER EXTREMITY FISTULA REVISION, SIDE BRANCH LIGATION,  AND SUPERFICIALIZATION;  Surgeon: Marty Heck, MD;  Location: [place];  Service: Vascular;  Laterality: Left;   FISTULA SUPERFICIALIZATION Left 07/17/2246   Procedure: PLICATION OF ANEURYSM LEFT ARTERIOVENOUS FISTULA;  Surgeon: Angelia Mould, MD;  Location: [place];  Service: Vascular;  Laterality: Left;   GIVENS CAPSULE STUDY N/A 06/30/2019   Procedure: GIVENS CAPSULE STUDY;  Surgeon: Ronald Lobo, MD;  Location: [place];  Service: Endoscopy;  Laterality: N/A;   HEMOSTASIS CLIP PLACEMENT  08/10/2020   Procedure: HEMOSTASIS CLIP PLACEMENT;  Surgeon: Jerene Bears, MD;  Location: [place];  Service: Gastroenterology;;   HEMOSTASIS CLIP PLACEMENT  01/12/2021   Procedure: HEMOSTASIS CLIP PLACEMENT;  Surgeon: Sharyn Creamer, MD;  Location: [place];  Service: Gastroenterology;;   HEMOSTASIS CONTROL  08/02/2019   Procedure: HEMOSTASIS CONTROL;  Surgeon: Yetta Flock, MD;  Location: [place];  Service: Gastroenterology;;   HOT HEMOSTASIS  02/24/2021   Procedure: HOT HEMOSTASIS (ARGON PLASMA COAGULATION/BICAP);  Surgeon: [name], MD;  Location: [facility] ENDOSCOPY;  Service: Gastroenterology;;   HOT HEMOSTASIS N/A 03/26/2021    Procedure: HOT HEMOSTASIS (ARGON PLASMA COAGULATION/BICAP);  Surgeon: Lavena Bullion, DO;  Location: [facility] ENDOSCOPY;  Service: Gastroenterology;  Laterality: N/A;   INCISION AND DRAINAGE ABSCESS N/A 06/29/2016   Procedure: INCISION AND DRAINAGE ABDOMINAL WALL ABSCESS;  Surgeon: Alphonsa Overall, MD;  Location: [facility];  Service: General;  Laterality: N/A;   INSERTION OF DIALYSIS CATHETER Right 08/03/2019   Procedure: INSERTION OF DIALYSIS CATHETER;  Surgeon: Waynetta Sandy, MD;  Location: [place];  Service: Vascular;  Laterality: Right;   INSERTION OF DIALYSIS CATHETER Right 10/22/2019   Procedure: INSERTION OF 23CM TUNNELED DIALYSIS CATHETER RIGHT INTERNAL JUGULAR;  Surgeon: Angelia Mould, MD;  Location: [place];  Service: Vascular;  Laterality: Right;   INSERTION OF DIALYSIS CATHETER Right 08/12/2020   Procedure: INSERTION OF Right internal Jugular TUNNELED  DIALYSIS CATHETER.;  Surgeon: Waynetta Sandy, MD;  Location: [place];  Service: Vascular;  Laterality: Right;   INSERTION OF DIALYSIS CATHETER N/A 04/19/2021   Procedure: INSERTION OF TUNNELED DIALYSIS CATHETER;  Surgeon: Angelia Mould, MD;  Location: [place];  Service: Vascular;  Laterality: N/A;   Left heel surgery     PENILE BIOPSY N/A 03/26/2020   Procedure: PENILE ULCER DEBRIDEMENT;  Surgeon: Remi Haggard, MD;  Location: [facility];  Service: Urology;  Laterality: N/A;  30 MINS   SCLEROTHERAPY  01/12/2021   Procedure: SCLEROTHERAPY;  Surgeon: Sharyn Creamer, MD;  Location: [place];  Service: Gastroenterology;;   Clide Deutscher  02/24/2021   Procedure: Clide Deutscher;  Surgeon: [name], MD;  Location: [facility] ENDOSCOPY;  Service: Gastroenterology;;   Family History  Family History  Problem Relation Age of Onset   Colon cancer Neg Hx    Social History  reports that he has been smoking cigarettes. He has a 22.50 pack-year smoking history. He has never used smokeless tobacco. He reports that he does not  currently use alcohol. He reports that he does not currently use drugs after having used the following drugs: "Crack" cocaine. Allergies  Allergies  Allergen Reactions   Seroquel [Quetiapine] Other (See Comments)    Tardive kinesia/dystonia   Dilaudid [Hydromorphone Hcl] Itching and Other (See Comments)    Pt reports itchiness after IM injection    Home medications Prior to Admission medications   Medication Sig Start Date End Date Taking? Authorizing Provider  amLODipine (NORVASC) 10 MG tablet Take 1 tablet (10 mg total) by mouth at bedtime. 03/23/21   [name], NP  blood glucose meter kit and supplies KIT Dispense based on patient and insurance preference. Use up to four times daily as directed. (FOR ICD-9 250.00, 250.01). 07/01/16   Nita Sells, MD  calcium acetate (PHOSLO) 667 MG capsule Take 1,334 mg by mouth 3 (three) times daily with meals. 03/14/21   [provider]  cloNIDine (CATAPRES) 0.1 MG tablet Take 0.1 mg by mouth See admin instructions. Take one tablet (0.1 mg) twice daily at 9am and 6pm on Sunday, Tuesday, Thursday, Saturday (non-dialysis days), take one tablet (0.1 mg) at bedtime - 9pm on Monday, Wednesday, Friday (dialysis days) 03/13/21   [provider]  ferrous sulfate 325 (65 FE) MG tablet Take 325 mg by mouth every morning.    [provider]  gabapentin (NEURONTIN) 100 MG capsule Take 1 capsule (100 mg total) by mouth 3 (three) times daily. 04/09/21   Waynetta Sandy, MD  hydrOXYzine (ATARAX) 25 MG tablet TAKE 1 TABLET BY MOUTH EVERY 8 HOURS AS NEEDED FOR ANXIETY OR  ITCHING Strength: 25 mg 04/23/21   Bonnielee Haff, MD  Lidocaine 5 % CREA Apply 1 application topically every Monday, Wednesday, and Friday. Dialysis days    [provider]  loperamide (IMODIUM) 2 MG capsule Take 2 capsules (4 mg total) by mouth 3 (three) times daily as needed for diarrhea or loose stools. Patient taking differently: Take 4  mg by mouth every 8 (eight) hours as needed for diarrhea or loose stools. 04/23/21   Bonnielee Haff, MD  metoprolol tartrate (LOPRESSOR) 100 MG tablet Take 1 tablet (100 mg total) by mouth once for 1 dose. To be taken 2 hours prior to procedure. 06/26/21 06/26/21  Lorretta Harp, MD  Multiple Vitamin (MULTIVITAMIN WITH MINERALS) TABS tablet Take 1 tablet by mouth at bedtime.    [provider]  Nutritional Supplements (,FEEDING SUPPLEMENT, PROSOURCE PLUS) liquid Take 30 mLs by mouth 3 (three) times daily after meals.    [provider]  pantoprazole (PROTONIX) 40 MG tablet Take 1 tablet (40 mg total) by mouth 2 (two) times daily. 02/28/21   Charlynne Cousins, MD  saccharomyces boulardii (FLORASTOR) 250 MG capsule Take 1 capsule (250 mg total) by mouth 2 (two) times daily. 04/23/21   Bonnielee Haff, MD  silver sulfADIAZINE (SILVADENE) 1 % cream Apply 1 application topically daily. To lower back    [provider]  colchicine 0.6 MG  tablet Take 0.5 tablets (0.3 mg total) by mouth 2 (two) times daily. 07/24/20 07/29/20  Noemi Chapel, MD  furosemide (LASIX) 40 MG tablet Take 1 tablet (40 mg total) by mouth daily. 07/25/19 02/09/20  Ladona Horns, MD     Vitals:   07/04/21 0017 07/04/21 0341 07/04/21 0500 07/04/21 0912  BP:  140/86  135/84  Pulse:  76  78  Resp:  18  19  Temp:  97.8 F (36.6 C)  98.2 F (36.8 C)  TempSrc:  Oral  Oral  SpO2:  95%  96%  Weight: 117.9 kg  117.9 kg   Height: 5' 11"  (1.803 m)      Exam Gen alert, no distress No rash, cyanosis or gangrene Sclera anicteric, throat clear  No jvd or bruits Chest clear bilat to bases, no rales/ wheezing RRR no RG Abd soft ntnd no mass or ascites +bs obese GU normal male MS L BKA wrapped Ext no LE or UE edema, no wounds or ulcers Neuro is alert, Ox 3 , nf  LUA AVF +bruit     Home meds include - norvasc, phoslo 2 ac, clonidine 0.1 mwf, neurontin 100 tid, lopressor 139m prn, MVI, prosouce plus,  protonix, florastor, prns/ vits/ supps     OP HD: MWF    4h  450/500   119kg   2/2 bath  Hep none  LUA AVF  - venofer 1041mq HD, 7 left  - mircera 225 q2, last 3/08  - hectorol 1 ug tiw IV   Assessment/ Plan: [place] - History of PUD and bleeding AVMs. Per GI/ pmd.  ESRD- on HD MWF. Plan HD tomorrow, or possibly Sat if interferes w/ GI procedure.   BP/ volume - cont BP medications, no gross vol excess, on RA.  Anemia ckd - Hb 8's, cont esa (next dose in 2 wks) and IV Fe load.  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 Ca in range.       Rob Adaiah Jaskot  MD 07/04/2021, 4:46 PM Recent Labs  Lab 07/03/21 1700 07/03/21 2144 07/04/21 0143 07/04/21 0906  HGB 8.0*   < > 7.9* 7.4*  ALBUMIN 2.6*  --  2.5*  --   CALCIUM 8.8*  --  9.5  --   CREATININE 4.25*  --  5.13*  --   K 4.9  --  3.6  --    < > = values in this interval not displayed.

## 2021-07-04 NOTE — Consult Note (Signed)
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  The patient states that he had a bout of melena during dialysis.  Over the past three years his HGB baseline was in the 7-8 range.  Before 12/2018 he had values from 12-14 g/dL.  Over the past two years from 06/2019 to the current admission the patient under went multiple endoscopic evaluations for IDA and melena.  His colonoscopy on 06/30/2019 with Dr. Cristina Gong showed that he had diverticula.  On four occassions last year was identified to have several actively bleeding sites.  All the areas were treated.   Past Medical History:  Diagnosis Date   Anemia    Diabetes mellitus without complication [facility])    patient denies   Dialysis patient [facility])    End stage chronic kidney disease ([place])    Hypertension    ICH (intracerebral hemorrhage) ([place]) 05/20/2017   PAD (peripheral artery disease) ([facility])    Shoulder pain, left 06/28/2013    Past Surgical History:  Procedure Laterality Date   A/V FISTULAGRAM N/A 08/15/2020   Procedure: A/V FISTULAGRAM - Left Upper;  Surgeon: Cherre Robins, MD;  Location: [place] [facility];  Service: Cardiovascular;  Laterality: N/A;   ABDOMINAL AORTOGRAM W/LOWER EXTREMITY N/A 04/08/2021   Procedure: ABDOMINAL AORTOGRAM W/LOWER EXTREMITY;  Surgeon: Waynetta Sandy, MD;  Location: [place] [facility];  Service: Cardiovascular;  Laterality: N/A;   AMPUTATION Left 04/16/2021   Procedure: LEFT BELOW KNEE AMPUTATION;  Surgeon: Angelia Mould, MD;  Location: [place];  Service: Vascular;  Laterality: Left;   AMPUTATION Left 06/17/2021   Procedure: REVISION AMPUTATION BELOW KNEE;  Surgeon: Broadus John, MD;  Location: [place];  Service: Vascular;  Laterality: Left;   APPENDECTOMY      APPLICATION OF WOUND VAC Left 06/17/2021   Procedure: APPLICATION OF WOUND VAC;  Surgeon: Broadus John, MD;  Location: [place];  Service: Vascular;  Laterality: Left;   AV FISTULA PLACEMENT Left 08/03/2019   Procedure: LEFT ARM ARTERIOVENOUS (AV) CEPHALIC  FISTULA CREATION;  Surgeon: Waynetta Sandy, MD;  Location: [place];  Service: Vascular;  Laterality: Left;   BIOPSY  06/30/2019   Procedure: BIOPSY;  Surgeon: Ronald Lobo, MD;  Location: [facility] ENDOSCOPY;  Service: Endoscopy;;   BIOPSY  08/02/2019   Procedure: BIOPSY;  Surgeon: Yetta Flock, MD;  Location: [facility] ENDOSCOPY;  Service: Gastroenterology;;   BIOPSY  02/08/2021   Procedure: BIOPSY;  Surgeon: Sharyn Creamer, MD;  Location: [facility] ENDOSCOPY;  Service: Gastroenterology;;   BIOPSY  02/24/2021   Procedure: BIOPSY;  Surgeon: [name], MD;  Location: [facility] ENDOSCOPY;  Service: Gastroenterology;;   BIOPSY  03/26/2021   Procedure: BIOPSY;  Surgeon: Lavena Bullion, DO;  Location: [place];  Service: Gastroenterology;;   COLONOSCOPY  01/23/2012   Procedure: COLONOSCOPY;  Surgeon: Danie Binder, MD;  [facility];  Service: Endoscopy;  Laterality: N/A;  11:10 AM   COLONOSCOPY WITH PROPOFOL N/A 06/30/2019   Procedure: COLONOSCOPY WITH PROPOFOL;  Surgeon: Ronald Lobo, MD;  Location: [place];  Service: Endoscopy;  Laterality: N/A;   ENTEROSCOPY N/A 08/02/2019   Procedure: ENTEROSCOPY;  Surgeon: Yetta Flock, MD;  Location: [facility] ENDOSCOPY;  Service: Gastroenterology;  Laterality: N/A;   ENTEROSCOPY N/A 02/24/2021  Procedure: ENTEROSCOPY;  Surgeon: [name], MD;  Location: [place];  Service: Gastroenterology;  Laterality: N/A;   ENTEROSCOPY N/A 03/26/2021   Procedure: ENTEROSCOPY;  Surgeon: Lavena Bullion, DO;  Location: [place];  Service: Gastroenterology;  Laterality: N/A;   ESOPHAGOGASTRODUODENOSCOPY N/A 08/10/2020   Procedure: ESOPHAGOGASTRODUODENOSCOPY (EGD);  Surgeon:  Jerene Bears, MD;  Location: [facility] ENDOSCOPY;  Service: Gastroenterology;  Laterality: N/A;   ESOPHAGOGASTRODUODENOSCOPY (EGD) WITH PROPOFOL N/A 06/30/2019   Procedure: ESOPHAGOGASTRODUODENOSCOPY (EGD) WITH PROPOFOL;  Surgeon: Ronald Lobo, MD;  Location: [place];  Service: Endoscopy;  Laterality: N/A;   ESOPHAGOGASTRODUODENOSCOPY (EGD) WITH PROPOFOL N/A 01/12/2021   Procedure: ESOPHAGOGASTRODUODENOSCOPY (EGD) WITH PROPOFOL;  Surgeon: Sharyn Creamer, MD;  Location: [place];  Service: Gastroenterology;  Laterality: N/A;   ESOPHAGOGASTRODUODENOSCOPY (EGD) WITH PROPOFOL N/A 02/08/2021   Procedure: ESOPHAGOGASTRODUODENOSCOPY (EGD) WITH PROPOFOL;  Surgeon: Sharyn Creamer, MD;  Location: [place];  Service: Gastroenterology;  Laterality: N/A;   FISTULA SUPERFICIALIZATION Left 10/17/2019   Procedure: LEFT UPPER EXTREMITY FISTULA REVISION, SIDE BRANCH LIGATION,  AND SUPERFICIALIZATION;  Surgeon: Marty Heck, MD;  Location: [place];  Service: Vascular;  Laterality: Left;   FISTULA SUPERFICIALIZATION Left 30/10/6224   Procedure: PLICATION OF ANEURYSM LEFT ARTERIOVENOUS FISTULA;  Surgeon: Angelia Mould, MD;  Location: [place];  Service: Vascular;  Laterality: Left;   GIVENS CAPSULE STUDY N/A 06/30/2019   Procedure: GIVENS CAPSULE STUDY;  Surgeon: Ronald Lobo, MD;  Location: [place];  Service: Endoscopy;  Laterality: N/A;   HEMOSTASIS CLIP PLACEMENT  08/10/2020   Procedure: HEMOSTASIS CLIP PLACEMENT;  Surgeon: Jerene Bears, MD;  Location: [place];  Service: Gastroenterology;;   HEMOSTASIS CLIP PLACEMENT  01/12/2021   Procedure: HEMOSTASIS CLIP PLACEMENT;  Surgeon: Sharyn Creamer, MD;  Location: [place];  Service: Gastroenterology;;   HEMOSTASIS CONTROL  08/02/2019   Procedure: HEMOSTASIS CONTROL;  Surgeon: Yetta Flock, MD;  Location: [place];  Service: Gastroenterology;;   HOT HEMOSTASIS  02/24/2021   Procedure: HOT HEMOSTASIS (ARGON PLASMA  COAGULATION/BICAP);  Surgeon: [name], MD;  Location: [facility] ENDOSCOPY;  Service: Gastroenterology;;   HOT HEMOSTASIS N/A 03/26/2021   Procedure: HOT HEMOSTASIS (ARGON PLASMA COAGULATION/BICAP);  Surgeon: Lavena Bullion, DO;  Location: [facility] ENDOSCOPY;  Service: Gastroenterology;  Laterality: N/A;   INCISION AND DRAINAGE ABSCESS N/A 06/29/2016   Procedure: INCISION AND DRAINAGE ABDOMINAL WALL ABSCESS;  Surgeon: Alphonsa Overall, MD;  Location: [facility];  Service: General;  Laterality: N/A;   INSERTION OF DIALYSIS CATHETER Right 08/03/2019   Procedure: INSERTION OF DIALYSIS CATHETER;  Surgeon: Waynetta Sandy, MD;  Location: [place];  Service: Vascular;  Laterality: Right;   INSERTION OF DIALYSIS CATHETER Right 10/22/2019   Procedure: INSERTION OF 23CM TUNNELED DIALYSIS CATHETER RIGHT INTERNAL JUGULAR;  Surgeon: Angelia Mould, MD;  Location: [place];  Service: Vascular;  Laterality: Right;   INSERTION OF DIALYSIS CATHETER Right 08/12/2020   Procedure: INSERTION OF Right internal Jugular TUNNELED  DIALYSIS CATHETER.;  Surgeon: Waynetta Sandy, MD;  Location: [place];  Service: Vascular;  Laterality: Right;   INSERTION OF DIALYSIS CATHETER N/A 04/19/2021   Procedure: INSERTION OF TUNNELED DIALYSIS CATHETER;  Surgeon: Angelia Mould, MD;  Location: [place];  Service: Vascular;  Laterality: N/A;   Left heel surgery     PENILE BIOPSY N/A 03/26/2020   Procedure: PENILE ULCER DEBRIDEMENT;  Surgeon: Remi Haggard, MD;  Location: [facility];  Service: Urology;  Laterality: N/A;  [place]  01/12/2021   Procedure: Clide Deutscher;  Surgeon: Sharyn Creamer, MD;  Location: [facility] ENDOSCOPY;  Service: Gastroenterology;;   Clide Deutscher  02/24/2021   Procedure: Clide Deutscher;  Surgeon: [name], MD;  Location: [facility] ENDOSCOPY;  Service: Gastroenterology;;    Family History  Problem Relation Age of Onset   Colon cancer Neg Hx     Social History:  reports that he  has been smoking cigarettes. He has a 22.50 pack-year smoking history. He has never used smokeless tobacco. He reports that he does not currently use alcohol. He reports that he does not currently use drugs after having used the following drugs: "Crack" cocaine.  Allergies:  Allergies  Allergen Reactions   Seroquel [Quetiapine] Other (See Comments)    Tardive kinesia/dystonia   Dilaudid [Hydromorphone Hcl] Itching and Other (See Comments)    Pt reports itchiness after IM injection     Medications: Scheduled:  amLODipine  10 mg Oral QHS   [START ON 07/05/2021] cloNIDine  0.1 mg Oral Q M,W,F-2000   cloNIDine  0.1 mg Oral 2 times per day on Sun Tue Thu Sat   gabapentin  100 mg Oral TID   lidocaine  1 patch Transdermal Q24H   pantoprazole  40 mg Oral BID   Continuous:  Results for orders placed or performed during the hospital encounter of 07/03/21 (from the past 24 hour(s))  Comprehensive metabolic panel     Status: Abnormal   Collection Time: 07/03/21  5:00 PM  Result Value Ref Range   Sodium 140 135 - 145 mmol/L   Potassium 4.9 3.5 - 5.1 mmol/L   Chloride 95 (L) 98 - 111 mmol/L   CO2 27 22 - 32 mmol/L   Glucose, Bld 94 70 - 99 mg/dL   BUN 11 8 - 23 mg/dL   Creatinine, Ser 4.25 (H) 0.61 - 1.24 mg/dL   Calcium 8.8 (L) 8.9 - 10.3 mg/dL   Total Protein 7.7 6.5 - 8.1 g/dL   Albumin 2.6 (L) 3.5 - 5.0 g/dL   AST 82 (H) 15 - 41 U/L   ALT 16 0 - 44 U/L   Alkaline Phosphatase 64 38 - 126 U/L   Total Bilirubin 2.0 (H) 0.3 - 1.2 mg/dL   GFR, Estimated 15 (L) >60 mL/min   Anion gap 18 (H) 5 - 15  CBC WITH DIFFERENTIAL     Status: Abnormal   Collection Time: 07/03/21  5:00 PM  Result Value Ref Range   WBC 4.4 4.0 - 10.5 K/uL   RBC 2.34 (L) 4.22 - 5.81 MIL/uL   Hemoglobin 8.0 (L) 13.0 - 17.0 g/dL   HCT 25.3 (L) 39.0 - 52.0 %   MCV 108.1 (H) 80.0 - 100.0 fL   MCH 34.2 (H) 26.0 - 34.0 pg   MCHC 31.6 30.0 - 36.0 g/dL   RDW 17.8 (H) 11.5 - 15.5 %   Platelets 173 150 - 400 K/uL   nRBC  1.1 (H) 0.0 - 0.2 %   Neutrophils Relative % 50 %   Neutro Abs 2.2 1.7 - 7.7 K/uL   Lymphocytes Relative 33 %   Lymphs Abs 1.4 0.7 - 4.0 K/uL   Monocytes Relative 12 %   Monocytes Absolute 0.5 0.1 - 1.0 K/uL   Eosinophils Relative 3 %   Eosinophils Absolute 0.1 0.0 - 0.5 K/uL   Basophils Relative 1 %   Basophils Absolute 0.0 0.0 - 0.1 K/uL   Immature Granulocytes 1 %   Abs Immature Granulocytes 0.05 0.00 - 0.07  K/uL  Protime-INR     Status: None   Collection Time: 07/03/21  5:00 PM  Result Value Ref Range   Prothrombin Time 13.7 11.4 - 15.2 seconds   INR 1.1 0.8 - 1.2  Troponin I (High Sensitivity)     Status: None   Collection Time: 07/03/21  5:00 PM  Result Value Ref Range   Troponin I (High Sensitivity) 11 <18 ng/L  D-dimer, quantitative     Status: Abnormal   Collection Time: 07/03/21  5:00 PM  Result Value Ref Range   D-Dimer, Quant 2.70 (H) 0.00 - 0.50 ug/mL-FEU  Type and screen [place]     Status: None   Collection Time: 07/03/21  5:44 PM  Result Value Ref Range   ABO/RH(D) B POS    Antibody Screen NEG    Sample Expiration      07/06/2021,2359 Performed at [facility], [place] [address] [place], [place] [address]   Troponin I (High Sensitivity)     Status: None   Collection Time: 07/03/21  7:33 PM  Result Value Ref Range   Troponin I (High Sensitivity) 10 <18 ng/L  POC occult blood, ED     Status: Abnormal   Collection Time: 07/03/21  7:49 PM  Result Value Ref Range   Fecal Occult Bld POSITIVE (A) NEGATIVE  Resp Panel by RT-PCR (Flu A&B, Covid) Nasopharyngeal Swab     Status: None   Collection Time: 07/03/21  9:07 PM   Specimen: Nasopharyngeal Swab; Nasopharyngeal(NP) swabs in vial transport medium  Result Value Ref Range   SARS Coronavirus 2 by RT PCR NEGATIVE NEGATIVE   Influenza A by PCR NEGATIVE NEGATIVE   Influenza B by PCR NEGATIVE NEGATIVE  Hemoglobin and hematocrit, blood     Status: Abnormal   Collection Time: 07/03/21  9:44  PM  Result Value Ref Range   Hemoglobin 7.6 (L) 13.0 - 17.0 g/dL   HCT 23.8 (L) 39.0 - 52.0 %  CBC with Differential/Platelet     Status: Abnormal   Collection Time: 07/04/21  1:43 AM  Result Value Ref Range   WBC 5.5 4.0 - 10.5 K/uL   RBC 2.29 (L) 4.22 - 5.81 MIL/uL   Hemoglobin 7.9 (L) 13.0 - 17.0 g/dL   HCT 24.0 (L) 39.0 - 52.0 %   MCV 104.8 (H) 80.0 - 100.0 fL   MCH 34.5 (H) 26.0 - 34.0 pg   MCHC 32.9 30.0 - 36.0 g/dL   RDW 17.5 (H) 11.5 - 15.5 %   Platelets 161 150 - 400 K/uL   nRBC 1.3 (H) 0.0 - 0.2 %   Neutrophils Relative % 48 %   Neutro Abs 2.6 1.7 - 7.7 K/uL   Lymphocytes Relative 34 %   Lymphs Abs 1.9 0.7 - 4.0 K/uL   Monocytes Relative 13 %   Monocytes Absolute 0.7 0.1 - 1.0 K/uL   Eosinophils Relative 4 %   Eosinophils Absolute 0.2 0.0 - 0.5 K/uL   Basophils Relative 0 %   Basophils Absolute 0.0 0.0 - 0.1 K/uL   Immature Granulocytes 1 %   Abs Immature Granulocytes 0.04 0.00 - 0.07 K/uL  Comprehensive metabolic panel     Status: Abnormal   Collection Time: 07/04/21  1:43 AM  Result Value Ref Range   Sodium 141 135 - 145 mmol/L   Potassium 3.6 3.5 - 5.1 mmol/L   Chloride 95 (L) 98 - 111 mmol/L   CO2 29 22 - 32 mmol/L  Glucose, Bld 86 70 - 99 mg/dL   BUN 12 8 - 23 mg/dL   Creatinine, Ser 5.13 (H) 0.61 - 1.24 mg/dL   Calcium 9.5 8.9 - 10.3 mg/dL   Total Protein 8.1 6.5 - 8.1 g/dL   Albumin 2.5 (L) 3.5 - 5.0 g/dL   AST 51 (H) 15 - 41 U/L   ALT 16 0 - 44 U/L   Alkaline Phosphatase 59 38 - 126 U/L   Total Bilirubin 0.3 0.3 - 1.2 mg/dL   GFR, Estimated 12 (L) >60 mL/min   Anion gap 17 (H) 5 - 15  Magnesium     Status: None   Collection Time: 07/04/21  1:43 AM  Result Value Ref Range   Magnesium 2.0 1.7 - 2.4 mg/dL  Ferritin     Status: None   Collection Time: 07/04/21  5:54 AM  Result Value Ref Range   Ferritin 218 24 - 336 ng/mL  Folate     Status: None   Collection Time: 07/04/21  5:54 AM  Result Value Ref Range   Folate 11.9 >5.9 ng/mL   Reticulocytes     Status: Abnormal   Collection Time: 07/04/21  5:54 AM  Result Value Ref Range   Retic Ct Pct 5.5 (H) 0.4 - 3.1 %   RBC. 2.15 (L) 4.22 - 5.81 MIL/uL   Retic Count, Absolute 118.5 19.0 - 186.0 K/uL   Immature Retic Fract 40.3 (H) 2.3 - 15.9 %  Iron and TIBC     Status: Abnormal   Collection Time: 07/04/21  5:54 AM  Result Value Ref Range   Iron 159 45 - 182 ug/dL   TIBC 361 250 - 450 ug/dL   Saturation Ratios 44 (H) 17.9 - 39.5 %   UIBC 202 ug/dL  Protime-INR     Status: None   Collection Time: 07/04/21  5:54 AM  Result Value Ref Range   Prothrombin Time 14.3 11.4 - 15.2 seconds   INR 1.1 0.8 - 1.2  Hemoglobin and hematocrit, blood     Status: Abnormal   Collection Time: 07/04/21  9:06 AM  Result Value Ref Range   Hemoglobin 7.4 (L) 13.0 - 17.0 g/dL   HCT 22.6 (L) 39.0 - 52.0 %     CT Angio Chest Pulmonary Embolism (PE) W or WO Contrast  Result Date: 07/04/2021 CLINICAL DATA:  Chest pain or SOB, pleurisy or effusion suspected EXAM: CT ANGIOGRAPHY CHEST WITH CONTRAST TECHNIQUE: Multidetector CT imaging of the chest was performed using the standard protocol during bolus administration of intravenous contrast. Multiplanar CT image reconstructions and MIPs were obtained to evaluate the vascular anatomy. RADIATION DOSE REDUCTION: This exam was performed according to the departmental dose-optimization program which includes automated exposure control, adjustment of the mA and/or kV according to patient size and/or use of iterative reconstruction technique. CONTRAST:  18m OMNIPAQUE IOHEXOL 350 MG/ML SOLN COMPARISON:  X-ray 07/03/2021, CT 06/29/2019 FINDINGS: Cardiovascular: Satisfactory opacification of the pulmonary arteries to the segmental level. Evaluation of the distal pulmonary arterial branches is degraded by respiratory motion artifact and beam hardening artifact. No evidence of pulmonary embolism to the lobar branch level. Thoracic aorta is nonaneurysmal.  Atherosclerotic calcifications of the aorta and coronary arteries. Stable cardiomegaly. No pericardial effusion. Mediastinum/Nodes: Mildly prominent precarinal lymph node measuring 11 mm short axis (series 6, image 92). Right hilar lymph node measures 13 mm short axis (series 6, image 115). No axillary lymphadenopathy. Trachea, thyroid gland, and esophagus appear within normal limits. Lungs/Pleura: Low lung volumes  with respiratory motion artifact. Rounded 2.2 cm subsolid density within the superior aspect of the right lower lobe (series 7, image 62) was present on the previous CT from 2021 however demonstrates increased solid component on the current exam. Interlobular septal thickening. Small bilateral pleural effusions with bibasilar atelectasis. Upper Abdomen: No acute abnormality. Musculoskeletal: No chest wall abnormality. No acute or significant osseous findings. Review of the MIP images confirms the above findings. IMPRESSION: 1. No evidence of pulmonary embolism to the lobar branch level. 2. Cardiomegaly with evidence of interstitial pulmonary edema including small bilateral pleural effusions and bibasilar atelectasis. 3. Rounded 2.2 cm subsolid density within the superior aspect of the right lower lobe was present on the previous CT from 2021 however demonstrates increased solid component on the current exam. Adenocarcinoma is considered. Thoracic surgery consultation is recommended. PET/CT should be considered for staging purposes. These recommendations are taken from: Recommendations for the Management of Subsolid Pulmonary Nodules Detected at CT: A Statement from the [facility] 2013; 266:1, 304-317. 4. Mildly prominent mediastinal and right hilar lymph nodes, nonspecific, but may be reactive. 5. Aortic Atherosclerosis (ICD10-I70.0). Electronically Signed   By: Davina Poke D.O.   On: 07/04/2021 08:55   CT Angio Aortobifemoral W and/or Wo Contrast  Result Date:  07/03/2021 CLINICAL DATA:  History of left BKA due to known peripheral vascular disease with right leg pain, initial encounter EXAM: CT ANGIOGRAPHY OF ABDOMINAL AORTA WITH ILIOFEMORAL RUNOFF TECHNIQUE: Multidetector CT imaging of the abdomen, pelvis and lower extremities was performed using the standard protocol during bolus administration of intravenous contrast. Multiplanar CT image reconstructions and MIPs were obtained to evaluate the vascular anatomy. RADIATION DOSE REDUCTION: This exam was performed according to the departmental dose-optimization program which includes automated exposure control, adjustment of the mA and/or kV according to patient size and/or use of iterative reconstruction technique. CONTRAST:  119m OMNIPAQUE IOHEXOL 350 MG/ML SOLN COMPARISON:  03/19/2021 CT, catheter based angiography from 04/08/21 FINDINGS: VASCULAR Aorta: Atherosclerotic calcifications are noted without aneurysmal dilatation or dissection. Celiac: Atherosclerotic calcifications are noted. No focal stenosis or aneurysmal dilatation is noted. SMA: Patent without evidence of aneurysm, dissection, vasculitis or significant stenosis. Renals: Single renal arteries are identified bilaterally with mild atherosclerotic change. IMA: Patent without evidence of aneurysm, dissection, vasculitis or significant stenosis. RIGHT Lower Extremity Inflow: Right iliac artery demonstrates atherosclerotic calcifications. Previously seen stenosis in the external iliac artery is not as well appreciated on today's exam. Runoff: Common femoral artery is calcified. Femoral bifurcation is patent. Superficial femoral artery demonstrates calcifications throughout its course with heavy focal calcification in the mid thigh similar to that seen on the prior exam. Focal stenosis in this area is noted as well. Popliteal artery also demonstrates heavy calcifications with multifocal areas of stenosis similar to that seen on the prior study. Infrapopliteal  vessels demonstrate heavy calcifications limiting evaluation of patency. This is similar to that seen on the prior exam. Recent angiogram obtained in December of 2022 shows the proximal infrapopliteal vessels to be patent. Primary runoff is noted posterior tibial artery. Correlate with Doppler pulses. LEFT Lower Extremity Inflow: Atherosclerotic calcifications of the iliac arteries are seen without focal hemodynamically significant stenosis. Runoff: Common femoral artery is patent as is the femoral bifurcation heavy calcifications are noted in the mid to distal superficial femoral artery with focal stenosis similar to that seen on prior arteriogram. No occlusive changes are seen. The popliteal artery also demonstrates heavy atherosclerotic calcifications. Popliteal trifurcation shows heavy calcifications. Changes of recent BKA  are noted. Veins: No specific venous abnormality is noted. Review of the MIP images confirms the above findings. NON-VASCULAR Lower chest: Minimal right pleural effusion is noted. No focal infiltrate is seen. Hepatobiliary: Liver is within normal limits. Cholelithiasis is noted without complicating factors. Pancreas: Unremarkable. No pancreatic ductal dilatation or surrounding inflammatory changes. Spleen: Normal in size without focal abnormality. Adrenals/Urinary Tract: Adrenal glands are well visualized and within normal limits. Kidneys show nonobstructing stone in the lower pole of the left kidney similar to that seen prior exam. The ureters are within normal limits. The bladder is partially distended. Stomach/Bowel: No obstructive or inflammatory changes of the colon are seen. Mild diverticular changes noted without evidence of diverticulitis. Some contrast is noted in the distal most aspect of the rectum which may represent some hemorrhoid related bleeding. Appendix has been surgically removed. Small bowel and stomach are within normal limits. Lymphatic: No sizable lymphadenopathy is  seen. Reproductive: Prostate is unremarkable. Other: No abdominal wall hernia or abnormality. No abdominopelvic ascites. Musculoskeletal: There are changes consistent with prior below-knee amputation. No bony changes to suggest osteomyelitis are seen. No acute bony abnormality is noted. There are degenerative changes of lumbar spine are seen. IMPRESSION: VASCULAR Heavy vascular calcifications are identified bilaterally with areas of stenosis in the superficial femoral arteries bilaterally similar to that seen on prior angiography from December of 2022. Distal runoff is difficult to evaluate due to heavy calcifications in the infrapopliteal vessels on the right although prior arteriography showed dominant runoff via the right posterior tibial artery. The overall appearance is similar to that seen on the prior exam. Previously described right external iliac artery stenosis is not appreciated on this exam and recent arteriography showed no focal stenosis in that region. NON-VASCULAR Evidence of cholelithiasis similar to that seen on the prior exam. Changes of prior appendectomy. Left BKA without complicating factors. Mild contrast is noted in the distal aspect of the rectum which may be related to hemorrhoidal bleed. Electronically Signed   By: Inez Catalina M.D.   On: 07/03/2021 19:45   DG Chest Portable 1 View  Result Date: 07/03/2021 CLINICAL DATA:  Chest pain. EXAM: PORTABLE CHEST 1 VIEW COMPARISON:  June 13, 2021 FINDINGS: The dialysis catheter has been removed. Tortuosity and calcific atherosclerotic disease of the aorta. Enlarged cardiac silhouette. There is no evidence of focal airspace consolidation, pleural effusion or pneumothorax. Minimal pulmonary vascular congestion. Osseous structures are without acute abnormality. Soft tissues are grossly normal. IMPRESSION: 1. Enlarged cardiac silhouette. 2. Minimal pulmonary vascular congestion. 3. Tortuosity and calcific atherosclerotic disease of the aorta.  Electronically Signed   By: Fidela Salisbury M.D.   On: 07/03/2021 17:57    ROS:  As stated above in the HPI otherwise negative.  Blood pressure 135/84, pulse 78, temperature 98.2 F (36.8 C), temperature source Oral, resp. rate 19, height '5\' 11"'$  (1.803 m), weight 117.9 kg, SpO2 96 %.    PE: Gen: NAD, Alert and Oriented HEENT:  [place]/AT, EOMI Neck: Supple, no LAD Lungs: CTA Bilaterally CV: RRR without M/G/R ABD: Soft, NTND, +BS Ext: No C/C/E  Assessment/Plan: 1) Melena - History of PUD and bleeding AVMs. 2) Chronic anemia. 3) ESRD.   The patient's HGB did not change significantly with the melena, but he has a significant history of bleeding.  It was reported that he had hematochezia, but he specifically states melena.  He has a history of diverticula.  Further evaluation with an enteroscopy and possible FFS will be performed tomorrow.  Plan: 1)  Monitor HGB. 2) Transfuse as necessary. 3) Enteroscopy +/- FFS tomorrow.  Marthann Abshier D 07/04/2021, 3:03 PM

## 2021-07-04 NOTE — H&P (Addendum)
History and Physical    PLEASE NOTE THAT DRAGON DICTATION SOFTWARE WAS USED IN THE CONSTRUCTION OF THIS NOTE.   Daniel [name] LHT:[id number] DOB: 1960-10-12 DOA: 07/03/2021  PCP: [name], NP  Patient coming from: home   I have personally briefly reviewed patient's old medical records in [place]  Chief Complaint: Bright red blood per rectum  HPI: Daniel [name] is a 61 y.o. male with medical history significant for end-stage renal disease on hemodialysis on Monday, Wednesday, Friday schedule, anemia of chronic kidney disease associated with baseline hemoglobin 7-8, hypertension, GERD, who is admitted to [facility] on 07/03/2021 with suspected acute lower gastrointestinal bleed after presenting from home to [facility] ED complaining of bright red blood per rectum.   The patient reports that he was at his routine scheduled hemodialysis session earlier today when he experienced a single bowel movement associated with bright red coloration in the absence of any dark coloration.  As this represents a departure from his baseline bowel movement coloration, the patient, after completion of the totality of his hemodialysis session, presented to [facility] emergency department for further evaluation and management thereof.  Denies any subsequent such episodes of bright red blood per rectum.  No associated any abdominal pain, nausea, vomiting, or preceding trauma.  Denies any associated left-sided chest pain, shortness of breath, palpitations, diaphoresis, dizziness, presyncope, or syncope.  No recent subjective fever, chills, rigors, or generalized myalgias.  Per chart review, he has a history of AVM associated with the small bowel as well as peptic ulcer disease.  Denies any recent/routine alcohol consumption.  Not on any blood thinners as an outpatient, including no aspirin.  He also acknowledges a history of GERD, for which she is on Protonix on a twice daily basis.  Denies any  recent or recurrent NSAID use, or any recent systemic corticosteroid use.  He has a history of chronic anemia, for which she is on daily oral iron supplementation, with chart review revealing baseline hemoglobin range of 7-8.  Medical history also notable for peripheral artery disease status post left BKA.  Patient reports 1 day of intermittent right lower extremity discomfort without any associated calf tenderness, new lower extremity erythema, or swelling.  Not associate with any numbness or paresthesias.  No recent preceding trauma.    ED Course:  Vital signs in the ED were notable for the following: Afebrile; heart rate 75-86; blood pressure 1 111/56 - 145/87 mmHg; respiratory rate 17-20, oxygen saturation 97 to 100% on room air.  Labs were notable for the following: BMP notable for the following: BUN 11.  High-sensitivity troponin I initially 11, with repeat value trending down to 10.  CBC notable for white blood cell count 4400, hemoglobin 8.0 compared to most recent prior hemoglobin data point of 7.8 on 06/21/2021, with this evening's hemoglobin noted to be associated with macrocytic/normochromic findings, platelet count 173.  INR 1.1.  Type and screen initiated in the ED this evening.  DRE performed by EDP revealed brown-colored stool, but with positive fecal occult blood test result.  COVID-19/influenza PCR negative.  Imaging and additional notable ED work-up: EKG shows sinus rhythm with heart rate 82, prolonged QT C of 514; no evidence of T wave or ST changes, including evidence of ST elevation.  Chest x-ray showed minimal pulmonary vascular congestion, but otherwise showed no evidence of acute cardiopulmonary process, including no evidence of infiltrate, interstitial edema, pulmonary edema, pleural effusion, or pneumothorax.  In the setting of the  patient's history of peripheral artery disease status post left BKA, with intermittent right lower extremity discomfort over the course the last  day, CTA of abdominal aorta with iliofemoral runoff was pursued, and showed and was notable for a mild amount of contrast noted in the distal aspect of the rectum which was felt to be potentially related to a hemorrhoidal bleed, but otherwise showed no evidence of acute process.   Distal pulses noted on bedside Doppler . EDP consulted Dr. Carlis Abbott of Vascular surgery, who will evaluate the patient.   While in the ED, the following were administered: Fentanyl 50 mcg IV x2, morphine 4 mg IV x1, gabapentin 100 mg p.o. x1.   Subsequently, the patient was admitted for overnight observation for suspected acute lower gastrointestinal bleed.    Review of Systems: As per HPI otherwise 10 point review of systems negative.   Past Medical History:  Diagnosis Date   Anemia    Diabetes mellitus without complication [facility])    patient denies   Dialysis patient [facility])    End stage chronic kidney disease ([place])    Hypertension    ICH (intracerebral hemorrhage) ([place]) 05/20/2017   PAD (peripheral artery disease) ([facility])    Shoulder pain, left 06/28/2013    Past Surgical History:  Procedure Laterality Date   A/V FISTULAGRAM N/A 08/15/2020   Procedure: A/V FISTULAGRAM - Left Upper;  Surgeon: Cherre Robins, MD;  Location: [place] CV LAB;  Service: Cardiovascular;  Laterality: N/A;   ABDOMINAL AORTOGRAM W/LOWER EXTREMITY N/A 04/08/2021   Procedure: ABDOMINAL AORTOGRAM W/LOWER EXTREMITY;  Surgeon: Waynetta Sandy, MD;  Location: [place] CV LAB;  Service: Cardiovascular;  Laterality: N/A;   AMPUTATION Left 04/16/2021   Procedure: LEFT BELOW KNEE AMPUTATION;  Surgeon: Angelia Mould, MD;  Location: [place];  Service: Vascular;  Laterality: Left;   AMPUTATION Left 06/17/2021   Procedure: REVISION AMPUTATION BELOW KNEE;  Surgeon: Broadus John, MD;  Location: [place];  Service: Vascular;  Laterality: Left;   APPENDECTOMY     APPLICATION OF WOUND VAC Left 06/17/2021   Procedure: APPLICATION  OF WOUND VAC;  Surgeon: Broadus John, MD;  Location: [place];  Service: Vascular;  Laterality: Left;   AV FISTULA PLACEMENT Left 08/03/2019   Procedure: LEFT ARM ARTERIOVENOUS (AV) CEPHALIC  FISTULA CREATION;  Surgeon: Waynetta Sandy, MD;  Location: [place];  Service: Vascular;  Laterality: Left;   BIOPSY  06/30/2019   Procedure: BIOPSY;  Surgeon: Ronald Lobo, MD;  Location: [facility] ENDOSCOPY;  Service: Endoscopy;;   BIOPSY  08/02/2019   Procedure: BIOPSY;  Surgeon: Yetta Flock, MD;  Location: [facility] ENDOSCOPY;  Service: Gastroenterology;;   BIOPSY  02/08/2021   Procedure: BIOPSY;  Surgeon: Sharyn Creamer, MD;  Location: [facility] ENDOSCOPY;  Service: Gastroenterology;;   BIOPSY  02/24/2021   Procedure: BIOPSY;  Surgeon: [name], MD;  Location: [facility] ENDOSCOPY;  Service: Gastroenterology;;   BIOPSY  03/26/2021   Procedure: BIOPSY;  Surgeon: Lavena Bullion, DO;  Location: [place];  Service: Gastroenterology;;   COLONOSCOPY  01/23/2012   Procedure: COLONOSCOPY;  Surgeon: Danie Binder, MD;  [facility];  Service: Endoscopy;  Laterality: N/A;  11:10 AM   COLONOSCOPY WITH PROPOFOL N/A 06/30/2019   Procedure: COLONOSCOPY WITH PROPOFOL;  Surgeon: Ronald Lobo, MD;  Location: [place];  Service: Endoscopy;  Laterality: N/A;   ENTEROSCOPY N/A 08/02/2019   Procedure: ENTEROSCOPY;  Surgeon: Yetta Flock, MD;  Location: [place] ENDOSCOPY;  Service: Gastroenterology;  Laterality: N/A;   ENTEROSCOPY N/A 02/24/2021   Procedure: ENTEROSCOPY;  Surgeon: [name], MD;  Location: [facility] ENDOSCOPY;  Service: Gastroenterology;  Laterality: N/A;   ENTEROSCOPY N/A 03/26/2021   Procedure: ENTEROSCOPY;  Surgeon: Lavena Bullion, DO;  Location: [place];  Service: Gastroenterology;  Laterality: N/A;   ESOPHAGOGASTRODUODENOSCOPY N/A 08/10/2020   Procedure: ESOPHAGOGASTRODUODENOSCOPY (EGD);  Surgeon: Jerene Bears, MD;  Location: [facility] ENDOSCOPY;  Service:  Gastroenterology;  Laterality: N/A;   ESOPHAGOGASTRODUODENOSCOPY (EGD) WITH PROPOFOL N/A 06/30/2019   Procedure: ESOPHAGOGASTRODUODENOSCOPY (EGD) WITH PROPOFOL;  Surgeon: Ronald Lobo, MD;  Location: [place];  Service: Endoscopy;  Laterality: N/A;   ESOPHAGOGASTRODUODENOSCOPY (EGD) WITH PROPOFOL N/A 01/12/2021   Procedure: ESOPHAGOGASTRODUODENOSCOPY (EGD) WITH PROPOFOL;  Surgeon: Sharyn Creamer, MD;  Location: [place];  Service: Gastroenterology;  Laterality: N/A;   ESOPHAGOGASTRODUODENOSCOPY (EGD) WITH PROPOFOL N/A 02/08/2021   Procedure: ESOPHAGOGASTRODUODENOSCOPY (EGD) WITH PROPOFOL;  Surgeon: Sharyn Creamer, MD;  Location: [place];  Service: Gastroenterology;  Laterality: N/A;   FISTULA SUPERFICIALIZATION Left 10/17/2019   Procedure: LEFT UPPER EXTREMITY FISTULA REVISION, SIDE BRANCH LIGATION,  AND SUPERFICIALIZATION;  Surgeon: Marty Heck, MD;  Location: [place];  Service: Vascular;  Laterality: Left;   FISTULA SUPERFICIALIZATION Left 53/66/4403   Procedure: PLICATION OF ANEURYSM LEFT ARTERIOVENOUS FISTULA;  Surgeon: Angelia Mould, MD;  Location: [place];  Service: Vascular;  Laterality: Left;   GIVENS CAPSULE STUDY N/A 06/30/2019   Procedure: GIVENS CAPSULE STUDY;  Surgeon: Ronald Lobo, MD;  Location: [place];  Service: Endoscopy;  Laterality: N/A;   HEMOSTASIS CLIP PLACEMENT  08/10/2020   Procedure: HEMOSTASIS CLIP PLACEMENT;  Surgeon: Jerene Bears, MD;  Location: [place];  Service: Gastroenterology;;   HEMOSTASIS CLIP PLACEMENT  01/12/2021   Procedure: HEMOSTASIS CLIP PLACEMENT;  Surgeon: Sharyn Creamer, MD;  Location: [place];  Service: Gastroenterology;;   HEMOSTASIS CONTROL  08/02/2019   Procedure: HEMOSTASIS CONTROL;  Surgeon: Yetta Flock, MD;  Location: [place];  Service: Gastroenterology;;   HOT HEMOSTASIS  02/24/2021   Procedure: HOT HEMOSTASIS (ARGON PLASMA COAGULATION/BICAP);  Surgeon: [name], MD;  Location: [facility]  ENDOSCOPY;  Service: Gastroenterology;;   HOT HEMOSTASIS N/A 03/26/2021   Procedure: HOT HEMOSTASIS (ARGON PLASMA COAGULATION/BICAP);  Surgeon: Lavena Bullion, DO;  Location: [facility] ENDOSCOPY;  Service: Gastroenterology;  Laterality: N/A;   INCISION AND DRAINAGE ABSCESS N/A 06/29/2016   Procedure: INCISION AND DRAINAGE ABDOMINAL WALL ABSCESS;  Surgeon: Alphonsa Overall, MD;  Location: [facility];  Service: General;  Laterality: N/A;   INSERTION OF DIALYSIS CATHETER Right 08/03/2019   Procedure: INSERTION OF DIALYSIS CATHETER;  Surgeon: Waynetta Sandy, MD;  Location: [place];  Service: Vascular;  Laterality: Right;   INSERTION OF DIALYSIS CATHETER Right 10/22/2019   Procedure: INSERTION OF 23CM TUNNELED DIALYSIS CATHETER RIGHT INTERNAL JUGULAR;  Surgeon: Angelia Mould, MD;  Location: [place];  Service: Vascular;  Laterality: Right;   INSERTION OF DIALYSIS CATHETER Right 08/12/2020   Procedure: INSERTION OF Right internal Jugular TUNNELED  DIALYSIS CATHETER.;  Surgeon: Waynetta Sandy, MD;  Location: [place];  Service: Vascular;  Laterality: Right;   INSERTION OF DIALYSIS CATHETER N/A 04/19/2021   Procedure: INSERTION OF TUNNELED DIALYSIS CATHETER;  Surgeon: Angelia Mould, MD;  Location: [place];  Service: Vascular;  Laterality: N/A;   Left heel surgery     PENILE BIOPSY N/A 03/26/2020   Procedure: PENILE ULCER DEBRIDEMENT;  Surgeon: Remi Haggard, MD;  Location: [facility];  Service: Urology;  Laterality: N/A;  30 MINS   SCLEROTHERAPY  01/12/2021   Procedure: SCLEROTHERAPY;  Surgeon: Sharyn Creamer, MD;  Location: [facility] ENDOSCOPY;  Service: Gastroenterology;;   Clide Deutscher  02/24/2021   Procedure: Clide Deutscher;  Surgeon: [name], MD;  Location: [facility] ENDOSCOPY;  Service: Gastroenterology;;    Social History:  reports that he has been smoking cigarettes. He has a 22.50 pack-year smoking history. He has never used smokeless tobacco. He reports that he does not currently  use alcohol. He reports that he does not currently use drugs after having used the following drugs: "Crack" cocaine.   Allergies  Allergen Reactions   Seroquel [Quetiapine] Other (See Comments)    Tardive kinesia/dystonia   Dilaudid [Hydromorphone Hcl] Itching and Other (See Comments)    Pt reports itchiness after IM injection     Family History  Problem Relation Age of Onset   Colon cancer Neg Hx     Family history reviewed and not pertinent    Prior to Admission medications   Medication Sig Start Date End Date Taking? Authorizing Provider  amLODipine (NORVASC) 10 MG tablet Take 1 tablet (10 mg total) by mouth at bedtime. 03/23/21   [name], NP  blood glucose meter kit and supplies KIT Dispense based on patient and insurance preference. Use up to four times daily as directed. (FOR ICD-9 250.00, 250.01). 07/01/16   Nita Sells, MD  calcium acetate (PHOSLO) 667 MG capsule Take 1,334 mg by mouth 3 (three) times daily with meals. 03/14/21   [provider]  cloNIDine (CATAPRES) 0.1 MG tablet Take 0.1 mg by mouth See admin instructions. Take one tablet (0.1 mg) twice daily at 9am and 6pm on Sunday, Tuesday, Thursday, Saturday (non-dialysis days), take one tablet (0.1 mg) at bedtime - 9pm on Monday, Wednesday, Friday (dialysis days) 03/13/21   [provider]  ferrous sulfate 325 (65 FE) MG tablet Take 325 mg by mouth every morning.    [provider]  gabapentin (NEURONTIN) 100 MG capsule Take 1 capsule (100 mg total) by mouth 3 (three) times daily. 04/09/21   Waynetta Sandy, MD  hydrOXYzine (ATARAX) 25 MG tablet TAKE 1 TABLET BY MOUTH EVERY 8 HOURS AS NEEDED FOR ANXIETY OR  ITCHING Strength: 25 mg 04/23/21   Bonnielee Haff, MD  Lidocaine 5 % CREA Apply 1 application topically every Monday, Wednesday, and Friday. Dialysis days    [provider]  loperamide (IMODIUM) 2 MG capsule Take 2 capsules (4 mg total) by mouth 3  (three) times daily as needed for diarrhea or loose stools. Patient taking differently: Take 4 mg by mouth every 8 (eight) hours as needed for diarrhea or loose stools. 04/23/21   Bonnielee Haff, MD  metoprolol tartrate (LOPRESSOR) 100 MG tablet Take 1 tablet (100 mg total) by mouth once for 1 dose. To be taken 2 hours prior to procedure. 06/26/21 06/26/21  Lorretta Harp, MD  Multiple Vitamin (MULTIVITAMIN WITH MINERALS) TABS tablet Take 1 tablet by mouth at bedtime.    [provider]  Nutritional Supplements (,FEEDING SUPPLEMENT, PROSOURCE PLUS) liquid Take 30 mLs by mouth 3 (three) times daily after meals.    [provider]  oxyCODONE-acetaminophen (PERCOCET/ROXICET) 5-325 MG tablet Take 1 tablet by mouth every 6 (six) hours as needed for severe pain. 06/20/21   Arrien, Jimmy Picket, MD  pantoprazole (PROTONIX) 40 MG tablet Take 1 tablet (40 mg total) by mouth 2 (two) times daily. 02/28/21   Aileen Fass,  Tammi Klippel, MD  saccharomyces boulardii (FLORASTOR) 250 MG capsule Take 1 capsule (250 mg total) by mouth 2 (two) times daily. 04/23/21   Bonnielee Haff, MD  silver sulfADIAZINE (SILVADENE) 1 % cream Apply 1 application topically daily. To lower back    [provider]  colchicine 0.6 MG tablet Take 0.5 tablets (0.3 mg total) by mouth 2 (two) times daily. 07/24/20 07/29/20  Noemi Chapel, MD  furosemide (LASIX) 40 MG tablet Take 1 tablet (40 mg total) by mouth daily. 07/25/19 02/09/20  Ladona Horns, MD     Objective    Physical Exam: Vitals:   07/03/21 2130 07/03/21 2331 07/04/21 0013 07/04/21 0017  BP: (!) 155/61 (!) 145/87 (!) 146/80   Pulse: 88 (!) 101 80   Resp:  18 17   Temp:  98.7 F (37.1 C) 98.1 F (36.7 C)   TempSrc:  Oral Oral   SpO2: 95% 100% 95%   Weight:    117.9 kg  Height:    5' 11"  (1.803 m)    General: appears to be stated age; alert, oriented Skin: warm, dry, no rash Head:  AT/[place] Mouth:  Oral mucosa membranes appear moist, normal  dentition Neck: supple; trachea midline Heart:  RRR; did not appreciate anyLungs: CTAB, did not appreciate any wheezes, rales, or rhonchi Abdomen: + BS; soft, ND, NT Vascular: 2+ pedal pulses b/l; 2+ radial pulses b/l Extremities: no peripheral edema, no muscle wasting; status post left BKA noted Neuro: strength and sensation intact in upper and lower extremities b/l   Labs on Admission: I have personally reviewed following labs and imaging studies  CBC: Recent Labs  Lab 07/03/21 1700 07/03/21 2144 07/04/21 0143  WBC 4.4  --  5.5  NEUTROABS 2.2  --  2.6  HGB 8.0* 7.6* 7.9*  HCT 25.3* 23.8* 24.0*  MCV 108.1*  --  104.8*  PLT 173  --  169   Basic Metabolic Panel: Recent Labs  Lab 07/03/21 1700 07/04/21 0143  NA 140 141  K 4.9 3.6  CL 95* 95*  CO2 27 29  GLUCOSE 94 86  BUN 11 12  CREATININE 4.25* 5.13*  CALCIUM 8.8* 9.5  MG  --  2.0   GFR: Estimated Creatinine Clearance: 19.7 mL/min (A) (by C-G formula based on SCr of 5.13 mg/dL (H)). Liver Function Tests: Recent Labs  Lab 07/03/21 1700 07/04/21 0143  AST 82* 51*  ALT 16 16  ALKPHOS 64 59  BILITOT 2.0* 0.3  PROT 7.7 8.1  ALBUMIN 2.6* 2.5*   No results for input(s): LIPASE, AMYLASE in the last 168 hours. No results for input(s): AMMONIA in the last 168 hours. Coagulation Profile: Recent Labs  Lab 07/03/21 1700  INR 1.1   Cardiac Enzymes: No results for input(s): CKTOTAL, CKMB, CKMBINDEX, TROPONINI in the last 168 hours. BNP (last 3 results) No results for input(s): PROBNP in the last 8760 hours. HbA1C: No results for input(s): HGBA1C in the last 72 hours. CBG: No results for input(s): GLUCAP in the last 168 hours. Lipid Profile: No results for input(s): CHOL, HDL, LDLCALC, TRIG, CHOLHDL, LDLDIRECT in the last 72 hours. Thyroid Function Tests: No results for input(s): TSH, T4TOTAL, FREET4, T3FREE, THYROIDAB in the last 72 hours. Anemia Panel: No results for input(s): VITAMINB12, FOLATE, FERRITIN,  TIBC, IRON, RETICCTPCT in the last 72 hours. Urine analysis:    Component Value Date/Time   COLORURINE YELLOW 07/31/2019 2128   APPEARANCEUR CLEAR 07/31/2019 2128   LABSPEC 1.009 07/31/2019 2128   PHURINE 5.0  07/31/2019 2128   GLUCOSEU NEGATIVE 07/31/2019 2128   HGBUR SMALL (A) 07/31/2019 2128   BILIRUBINUR NEGATIVE 07/31/2019 2128   KETONESUR NEGATIVE 07/31/2019 2128   PROTEINUR 100 (A) 07/31/2019 2128   UROBILINOGEN 0.2 12/31/2009 0746   NITRITE NEGATIVE 07/31/2019 2128   LEUKOCYTESUR NEGATIVE 07/31/2019 2128    Radiological Exams on Admission: CT Angio Aortobifemoral W and/or Wo Contrast  Result Date: 07/03/2021 CLINICAL DATA:  History of left BKA due to known peripheral vascular disease with right leg pain, initial encounter EXAM: CT ANGIOGRAPHY OF ABDOMINAL AORTA WITH ILIOFEMORAL RUNOFF TECHNIQUE: Multidetector CT imaging of the abdomen, pelvis and lower extremities was performed using the standard protocol during bolus administration of intravenous contrast. Multiplanar CT image reconstructions and MIPs were obtained to evaluate the vascular anatomy. RADIATION DOSE REDUCTION: This exam was performed according to the departmental dose-optimization program which includes automated exposure control, adjustment of the mA and/or kV according to patient size and/or use of iterative reconstruction technique. CONTRAST:  134m OMNIPAQUE IOHEXOL 350 MG/ML SOLN COMPARISON:  03/19/2021 CT, catheter based angiography from 04/08/21 FINDINGS: VASCULAR Aorta: Atherosclerotic calcifications are noted without aneurysmal dilatation or dissection. Celiac: Atherosclerotic calcifications are noted. No focal stenosis or aneurysmal dilatation is noted. SMA: Patent without evidence of aneurysm, dissection, vasculitis or significant stenosis. Renals: Single renal arteries are identified bilaterally with mild atherosclerotic change. IMA: Patent without evidence of aneurysm, dissection, vasculitis or significant  stenosis. RIGHT Lower Extremity Inflow: Right iliac artery demonstrates atherosclerotic calcifications. Previously seen stenosis in the external iliac artery is not as well appreciated on today's exam. Runoff: Common femoral artery is calcified. Femoral bifurcation is patent. Superficial femoral artery demonstrates calcifications throughout its course with heavy focal calcification in the mid thigh similar to that seen on the prior exam. Focal stenosis in this area is noted as well. Popliteal artery also demonstrates heavy calcifications with multifocal areas of stenosis similar to that seen on the prior study. Infrapopliteal vessels demonstrate heavy calcifications limiting evaluation of patency. This is similar to that seen on the prior exam. Recent angiogram obtained in December of 2022 shows the proximal infrapopliteal vessels to be patent. Primary runoff is noted posterior tibial artery. Correlate with Doppler pulses. LEFT Lower Extremity Inflow: Atherosclerotic calcifications of the iliac arteries are seen without focal hemodynamically significant stenosis. Runoff: Common femoral artery is patent as is the femoral bifurcation heavy calcifications are noted in the mid to distal superficial femoral artery with focal stenosis similar to that seen on prior arteriogram. No occlusive changes are seen. The popliteal artery also demonstrates heavy atherosclerotic calcifications. Popliteal trifurcation shows heavy calcifications. Changes of recent BKA are noted. Veins: No specific venous abnormality is noted. Review of the MIP images confirms the above findings. NON-VASCULAR Lower chest: Minimal right pleural effusion is noted. No focal infiltrate is seen. Hepatobiliary: Liver is within normal limits. Cholelithiasis is noted without complicating factors. Pancreas: Unremarkable. No pancreatic ductal dilatation or surrounding inflammatory changes. Spleen: Normal in size without focal abnormality. Adrenals/Urinary Tract:  Adrenal glands are well visualized and within normal limits. Kidneys show nonobstructing stone in the lower pole of the left kidney similar to that seen prior exam. The ureters are within normal limits. The bladder is partially distended. Stomach/Bowel: No obstructive or inflammatory changes of the colon are seen. Mild diverticular changes noted without evidence of diverticulitis. Some contrast is noted in the distal most aspect of the rectum which may represent some hemorrhoid related bleeding. Appendix has been surgically removed. Small bowel and stomach are within  normal limits. Lymphatic: No sizable lymphadenopathy is seen. Reproductive: Prostate is unremarkable. Other: No abdominal wall hernia or abnormality. No abdominopelvic ascites. Musculoskeletal: There are changes consistent with prior below-knee amputation. No bony changes to suggest osteomyelitis are seen. No acute bony abnormality is noted. There are degenerative changes of lumbar spine are seen. IMPRESSION: VASCULAR Heavy vascular calcifications are identified bilaterally with areas of stenosis in the superficial femoral arteries bilaterally similar to that seen on prior angiography from December of 2022. Distal runoff is difficult to evaluate due to heavy calcifications in the infrapopliteal vessels on the right although prior arteriography showed dominant runoff via the right posterior tibial artery. The overall appearance is similar to that seen on the prior exam. Previously described right external iliac artery stenosis is not appreciated on this exam and recent arteriography showed no focal stenosis in that region. NON-VASCULAR Evidence of cholelithiasis similar to that seen on the prior exam. Changes of prior appendectomy. Left BKA without complicating factors. Mild contrast is noted in the distal aspect of the rectum which may be related to hemorrhoidal bleed. Electronically Signed   By: Inez Catalina M.D.   On: 07/03/2021 19:45   DG Chest  Portable 1 View  Result Date: 07/03/2021 CLINICAL DATA:  Chest pain. EXAM: PORTABLE CHEST 1 VIEW COMPARISON:  June 13, 2021 FINDINGS: The dialysis catheter has been removed. Tortuosity and calcific atherosclerotic disease of the aorta. Enlarged cardiac silhouette. There is no evidence of focal airspace consolidation, pleural effusion or pneumothorax. Minimal pulmonary vascular congestion. Osseous structures are without acute abnormality. Soft tissues are grossly normal. IMPRESSION: 1. Enlarged cardiac silhouette. 2. Minimal pulmonary vascular congestion. 3. Tortuosity and calcific atherosclerotic disease of the aorta. Electronically Signed   By: Fidela Salisbury M.D.   On: 07/03/2021 17:57     EKG: Independently reviewed, with result as described above.    Assessment/Plan   Principal Problem:   Acute lower GI bleeding Active Problems:   Essential hypertension   Prolonged QT interval   End-stage renal disease on hemodialysis (HCC)   GERD (gastroesophageal reflux disease)   Right leg pain      #) Acute lower GI bleed: diagnosis on the basis of episode of bright red blood per rectum in the absence of associated melena, and with DRE revealing Hemoccult positive stool, with laboratory studies notable for nonelevated BUN further substantiating suspicion of a lower GI source, particularly given the patient's relative fully asymptomatic nature as well as his appearance of hemodynamic stability, which would be less typical for presentation involving an acute upper gastrointestinal source, while acknowledging that 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  Of note, relative to his history of chronic anemia with baseline hemoglobin in the range of 7-8, presenting hemoglobin noted to be 8.0 relative to most recent prior value of 7.8 on 06/21/2021.  Not on any blood thinners as an outpatient, including no aspirin. Denies recent alcohol abuse. INR 1.1.   Of note, CTA shows small mount of contrast in the distal aspect of the rectum, felt to potentially be consistent with hemorrhoidal bleed.  Differential for this patient suspected acute lower gastrointestinal bleed includes diverticular bleed, bleeding colonic polyps, AVM's, hemorrhoids.  Patient appears hemodynamically stable with normotensive/non-tachycardic vital signs thus far, and is currently asymptomatic.   Given suspected lower GI source, there does not appear to be an indication for initiation of Protonix drip. Of note, patient was typed and screened in the  ED today.        Plan:. Refraining from pharmacologic DVT prophylaxis. Monitor on telemetry. Monitor continuous pulse-ox. Maintain at least 2 large bore IV's. Check INR in the AM. Q4H H&H's have been ordered through 9 AM on 07/04/2021. Will closely monitor these ensuing Hgb levels and correlate these data points with the patient's overall clinical picture including vital signs to determine need for transfusion.  Given macrocytic finding associated with the patient's anemia, will add on MMA and B12 level.  Additionally, in the setting of chronic oral iron supplementation, will also add on iron studies to evaluate for any opportunity for optimization from an iron deficiency standpoint.       #) Chronic anemia: Documented history of such, with baseline hemoglobin of 7-8, potentially multifactorial in nature, with contributions from anemia of chronic kidney disease as well as potential element of chronic iron deficiency anemia, for which the patient is on daily oral iron supplementation as outpatient.  Of note, presenting hemoglobin found to be consistent with baseline range, and trending up relative to most recent prior hemoglobin that was drawn during the final week of February 2023, as further quantified above.  Presenting hemoglobin noted to be macrocytic, as above.     Plan: Check INR in the morning. Add on the following to initial lab  specimen collected in the ED today: total iron, TIBC, ferritin, MMA, folic acid level, reticulocyte count.  Repeat CBC in the morning.  Trending of serial hemoglobin values, as outlined above.          #) QTc prolongation: Presenting EKG demonstrates QTc of 514 ms. no overt outpatient medications that are obviously contributing to QTc prolongation.    Plan: Monitor on telemetry.  Add-on serum magnesium level.  Repeat EKG in the morning to monitor interval degree of QTc prolongation.        #) Right leg discomfort: 1 day of right leg pain, with distal pulses noted on bedside Doppler, as further detailed above, and without any evidence of acute process on CTA abdominal aorta with iliofemoral runoff.  However, in the setting of suspected presenting acute lower gastrointestinal bleed, increasing risk for associated type II supply/demand mismatch in the context of known peripheral artery disease, which includes a history of left BKA, EDP is consulted the on-call vascular surgeon, Dr. Carlis Abbott, who will formally consult.  Plan: Vascular surgery consulted, as above.  Further evaluation and management of presenting suspected acute lower gastrointestinal bleed, as above.         #) Essential Hypertension: documented h/o such, with outpatient antihypertensive regimen including Norvasc, oral clonidine.  SBP's in the ED today: In the 110s to 140s mmHg, without any associated hypotensive values, notable in the setting of suspected acute lower gastrointestinal bleed, as above.  In this context will hold home antihypertensive medications for now, while closely monitoring ensuing hemoglobin trend, as further outlined above.  Plan: Close monitoring of subsequent BP via routine VS. hold home antihypertensive medications for now.        #) ESRD: on HD (schedule: Monday, Wednesday, Friday).  Reportedly completed the totality of his HD session on 07/03/2021 before presenting to [facility] emergency  department . next due for routine HD on Friday, 07/05/2021. No clinical evidence for urgent overnight HD or to expedite HD relative to this timeframe.   Plan: monitor strict I's/O's, daily weights. CMP in the AM. Check mag and phos levels.  Continue outpatient PhosLo         #)  GERD: documented h/o such; on Protonix 40 mg p.o. twice daily as outpatient.   Plan: continue home PPI.       DVT prophylaxis: SCD's   Code Status: Full code Family Communication: none Disposition Plan: Per Rounding Team Consults called: Dr. Carlis Abbott of Vascular surgery consulted, as further detailed above;  Admission status: Observation    PLEASE NOTE THAT DRAGON DICTATION SOFTWARE WAS USED IN THE CONSTRUCTION OF THIS NOTE.   [place] DO [facility]  From [place]   07/04/2021, 3:34 AM

## 2021-07-04 NOTE — Progress Notes (Signed)
PROGRESS NOTE    Daniel [name]  EAV:[id number] DOB: [birth year] DOA: 07/03/2021 PCP: Grayce Sessions, NP    Brief Narrative:  61 year old with history of ESRD on hemodialysis Monday Wednesday Friday, anemia of chronic disease with baseline hemoglobin 7-8, multiple AV malformations GI tract, recent left below-knee amputation and currently at a skilled nursing facility brought to emergency room from dialysis center after he had a large volume fresh rectal bleed after completing dialysis on 3/8. Patient was mainly complaining of right-sided chest pain, lot of pain on his left below-knee amputation stump and also on the right leg.  Denied any nausea vomiting or abdominal pain or distention. Seen by vascular surgery on arrival.  CT angiogram lower extremities without evidence of acute blockages.   Assessment & Plan:   Acute lower GI bleeding, acute on chronic anemia of blood loss:  Known previous AVMs. Patient had repeat small-volume rectal bleeding overnight.  Currently stable.  Hemoglobin 7.4. Continue to monitor hemoglobin every 12 hours and transfuse for less than 7.  Due to significant history of AVMs and ongoing pressure rectal bleeding, may probably need flexible sigmoidoscopy or colonoscopy.  Will discuss with gastroenterology.  Not on antiplatelets or antithrombotics.  Right-sided chest pain: Complaint of severe pleuritic chest pain, CT angiogram of the chest negative for pulmonary embolism.  Musculoskeletal pain.  Lidocaine patch and Percocet.  Severe peripheral vascular disease status post left below-knee amputation: Seen by vascular surgery.  Symptomatic treatment suggested.  CT angiogram without any evidence of acute arterial compromise.  Essential hypertension: Blood pressure is stable.  We will resume home medication esculin Norvasc and clonidine.  ESRD on hemodialysis: Requiring dialysis tomorrow.  Will notify nephrology.   DVT prophylaxis: SCDs Start: 07/03/21  2110   Code Status: Full code Family Communication: None Disposition Plan: Status is: Observation The patient will require care spanning > 2 midnights and should be moved to inpatient because: Ongoing rectal bleeding.  Possible inpatient procedures needed.     Consultants:  Gastroenterology, called Nephrology, called  Procedures:  None  Antimicrobials:  None   Subjective: Patient seen and examined in the morning rounds.  He was much worried about poor circulation on the right leg and was scared of losing his right leg also.  Patient complains of right-sided severe chest pain, aggravated by deep breathing.  Denies any trauma. He is at a nursing home and mostly doing stationary exercises.  Objective: Vitals:   07/04/21 0017 07/04/21 0341 07/04/21 0500 07/04/21 0912  BP:  140/86  135/84  Pulse:  76  78  Resp:  18  19  Temp:  97.8 F (36.6 C)  98.2 F (36.8 C)  TempSrc:  Oral  Oral  SpO2:  95%  96%  Weight: 117.9 kg  117.9 kg   Height: 5\' 11"  (1.803 m)       Intake/Output Summary (Last 24 hours) at 07/04/2021 1311 Last data filed at 07/04/2021 1200 Gross per 24 hour  Intake --  Output 200 ml  Net -200 ml   Filed Weights   07/03/21 1707 07/04/21 0017 07/04/21 0500  Weight: 117.9 kg 117.9 kg 117.9 kg    Examination:  General exam: Appears calm and comfortable  Frail and debilitated.  Chronically sick looking.  Not in any distress. Respiratory system: Clear to auscultation. Respiratory effort normal.  No added sounds. Reproducible chest pain right anterior chest wall.  He also has superficial skin eschars but no evidence of active lesions. Cardiovascular system: S1 & S2 heard,  RRR. No JVD, murmurs, rubs, gallops or clicks.  Gastrointestinal system: Abdomen is nondistended, soft and nontender. No organomegaly or masses felt. Normal bowel sounds heard. Central nervous system: Alert and oriented. No focal neurological deficits. AV fistula with thrill left upper  extremity. Left below-knee amputation stump with some dehiscence, clean and dry.  Pictures in the chart. Right foot with chronic ischemic changes.  Dopplerable pulses present.   Data Reviewed: I have personally reviewed following labs and imaging studies  CBC: Recent Labs  Lab 07/03/21 1700 07/03/21 2144 07/04/21 0143 07/04/21 0906  WBC 4.4  --  5.5  --   NEUTROABS 2.2  --  2.6  --   HGB 8.0* 7.6* 7.9* 7.4*  HCT 25.3* 23.8* 24.0* 22.6*  MCV 108.1*  --  104.8*  --   PLT 173  --  161  --    Basic Metabolic Panel: Recent Labs  Lab 07/03/21 1700 07/04/21 0143  NA 140 141  K 4.9 3.6  CL 95* 95*  CO2 27 29  GLUCOSE 94 86  BUN 11 12  CREATININE 4.25* 5.13*  CALCIUM 8.8* 9.5  MG  --  2.0   GFR: Estimated Creatinine Clearance: 19.7 mL/min (A) (by C-G formula based on SCr of 5.13 mg/dL (H)). Liver Function Tests: Recent Labs  Lab 07/03/21 1700 07/04/21 0143  AST 82* 51*  ALT 16 16  ALKPHOS 64 59  BILITOT 2.0* 0.3  PROT 7.7 8.1  ALBUMIN 2.6* 2.5*   No results for input(s): LIPASE, AMYLASE in the last 168 hours. No results for input(s): AMMONIA in the last 168 hours. Coagulation Profile: Recent Labs  Lab 07/03/21 1700 07/04/21 0554  INR 1.1 1.1   Cardiac Enzymes: No results for input(s): CKTOTAL, CKMB, CKMBINDEX, TROPONINI in the last 168 hours. BNP (last 3 results) No results for input(s): PROBNP in the last 8760 hours. HbA1C: No results for input(s): HGBA1C in the last 72 hours. CBG: No results for input(s): GLUCAP in the last 168 hours. Lipid Profile: No results for input(s): CHOL, HDL, LDLCALC, TRIG, CHOLHDL, LDLDIRECT in the last 72 hours. Thyroid Function Tests: No results for input(s): TSH, T4TOTAL, FREET4, T3FREE, THYROIDAB in the last 72 hours. Anemia Panel: Recent Labs    07/04/21 0554  FOLATE 11.9  FERRITIN 218  TIBC 361  IRON 159  RETICCTPCT 5.5*   Sepsis Labs: No results for input(s): PROCALCITON, LATICACIDVEN in the last 168  hours.  Recent Results (from the past 240 hour(s))  Resp Panel by RT-PCR (Flu A&B, Covid) Nasopharyngeal Swab     Status: None   Collection Time: 07/03/21  9:07 PM   Specimen: Nasopharyngeal Swab; Nasopharyngeal(NP) swabs in vial transport medium  Result Value Ref Range Status   SARS Coronavirus 2 by RT PCR NEGATIVE NEGATIVE Final    Comment: (NOTE) SARS-CoV-2 target nucleic acids are NOT DETECTED.  The SARS-CoV-2 RNA is generally detectable in upper respiratory specimens during the acute phase of infection. The lowest concentration of SARS-CoV-2 viral copies this assay can detect is 138 copies/mL. A negative result does not preclude SARS-Cov-2 infection and should not be used as the sole basis for treatment or other patient management decisions. A negative result may occur with  improper specimen collection/handling, submission of specimen other than nasopharyngeal swab, presence of viral mutation(s) within the areas targeted by this assay, and inadequate number of viral copies(<138 copies/mL). A negative result must be combined with clinical observations, patient history, and epidemiological information. The expected result is Negative.  Fact  Sheet for Patients:  BloggerCourse.com  Fact Sheet for Healthcare Providers:  SeriousBroker.it  This test is no t yet approved or cleared by the [place] FDA and  has been authorized for detection and/or diagnosis of SARS-CoV-2 by FDA under an Emergency Use Authorization (EUA). This EUA will remain  in effect (meaning this test can be used) for the duration of the COVID-19 declaration under Section 564(b)(1) of the Act, 21 U.S.C.section 360bbb-3(b)(1), unless the authorization is terminated  or revoked sooner.       Influenza A by PCR NEGATIVE NEGATIVE Final   Influenza B by PCR NEGATIVE NEGATIVE Final    Comment: (NOTE) The Xpert Xpress SARS-CoV-2/FLU/RSV plus assay is intended  as an aid in the diagnosis of influenza from Nasopharyngeal swab specimens and should not be used as a sole basis for treatment. Nasal washings and aspirates are unacceptable for Xpert Xpress SARS-CoV-2/FLU/RSV testing.  Fact Sheet for Patients: BloggerCourse.com  Fact Sheet for Healthcare Providers: SeriousBroker.it  This test is not yet approved or cleared by the [place] FDA and has been authorized for detection and/or diagnosis of SARS-CoV-2 by FDA under an Emergency Use Authorization (EUA). This EUA will remain in effect (meaning this test can be used) for the duration of the COVID-19 declaration under Section 564(b)(1) of the Act, 21 U.S.C. section 360bbb-3(b)(1), unless the authorization is terminated or revoked.  Performed at [facility], [address], [place], [place] [address]          Radiology Studies: CT Angio Chest Pulmonary Embolism (PE) W or WO Contrast  Result Date: 07/04/2021 CLINICAL DATA:  Chest pain or SOB, pleurisy or effusion suspected EXAM: CT ANGIOGRAPHY CHEST WITH CONTRAST TECHNIQUE: Multidetector CT imaging of the chest was performed using the standard protocol during bolus administration of intravenous contrast. Multiplanar CT image reconstructions and MIPs were obtained to evaluate the vascular anatomy. RADIATION DOSE REDUCTION: This exam was performed according to the departmental dose-optimization program which includes automated exposure control, adjustment of the mA and/or kV according to patient size and/or use of iterative reconstruction technique. CONTRAST:  OMNIPAQUE IOHEXOL 350 MG/ML SOLN COMPARISON:  X-ray 07/03/2021, CT 06/29/2019 FINDINGS: Cardiovascular: Satisfactory opacification of the pulmonary arteries to the segmental level. Evaluation of the distal pulmonary arterial branches is degraded by respiratory motion artifact and beam hardening artifact. No evidence of pulmonary  embolism to the lobar branch level. Thoracic aorta is nonaneurysmal. Atherosclerotic calcifications of the aorta and coronary arteries. Stable cardiomegaly. No pericardial effusion. Mediastinum/Nodes: Mildly prominent precarinal lymph node measuring 11 mm short axis (series 6, image 92). Right hilar lymph node measures 13 mm short axis (series 6, image 115). No axillary lymphadenopathy. Trachea, thyroid gland, and esophagus appear within normal limits. Lungs/Pleura: Low lung volumes with respiratory motion artifact. Rounded 2.2 cm subsolid density within the superior aspect of the right lower lobe (series 7, image 62) was present on the previous CT from 2021 however demonstrates increased solid component on the current exam. Interlobular septal thickening. Small bilateral pleural effusions with bibasilar atelectasis. Upper Abdomen: No acute abnormality. Musculoskeletal: No chest wall abnormality. No acute or significant osseous findings. Review of the MIP images confirms the above findings. IMPRESSION: 1. No evidence of pulmonary embolism to the lobar branch level. 2. Cardiomegaly with evidence of interstitial pulmonary edema including small bilateral pleural effusions and bibasilar atelectasis. 3. Rounded 2.2 cm subsolid density within the superior aspect of the right lower lobe was present on the previous CT from 2021 however demonstrates  increased solid component on the current exam. Adenocarcinoma is considered. Thoracic surgery consultation is recommended. PET/CT should be considered for staging purposes. These recommendations are taken from: Recommendations for the Management of Subsolid Pulmonary Nodules Detected at CT: A Statement from the Fleischner Society Radiology 2013; 266:1, 304-317. 4. Mildly prominent mediastinal and right hilar lymph nodes, nonspecific, but may be reactive. 5. Aortic Atherosclerosis (ICD10-I70.0). Electronically Signed   By: Duanne Guess D.O.   On: 07/04/2021 08:55   CT Angio  Aortobifemoral W and/or Wo Contrast  Result Date: 07/03/2021 CLINICAL DATA:  History of left BKA due to known peripheral vascular disease with right leg pain, initial encounter EXAM: CT ANGIOGRAPHY OF ABDOMINAL AORTA WITH ILIOFEMORAL RUNOFF TECHNIQUE: Multidetector CT imaging of the abdomen, pelvis and lower extremities was performed using the standard protocol during bolus administration of intravenous contrast. Multiplanar CT image reconstructions and MIPs were obtained to evaluate the vascular anatomy. RADIATION DOSE REDUCTION: This exam was performed according to the departmental dose-optimization program which includes automated exposure control, adjustment of the mA and/or kV according to patient size and/or use of iterative reconstruction technique. CONTRAST:  OMNIPAQUE IOHEXOL 350 MG/ML SOLN COMPARISON:  03/19/2021 CT, catheter based angiography from 04/08/21 FINDINGS: VASCULAR Aorta: Atherosclerotic calcifications are noted without aneurysmal dilatation or dissection. Celiac: Atherosclerotic calcifications are noted. No focal stenosis or aneurysmal dilatation is noted. SMA: Patent without evidence of aneurysm, dissection, vasculitis or significant stenosis. Renals: Single renal arteries are identified bilaterally with mild atherosclerotic change. IMA: Patent without evidence of aneurysm, dissection, vasculitis or significant stenosis. RIGHT Lower Extremity Inflow: Right iliac artery demonstrates atherosclerotic calcifications. Previously seen stenosis in the external iliac artery is not as well appreciated on today's exam. Runoff: Common femoral artery is calcified. Femoral bifurcation is patent. Superficial femoral artery demonstrates calcifications throughout its course with heavy focal calcification in the mid thigh similar to that seen on the prior exam. Focal stenosis in this area is noted as well. Popliteal artery also demonstrates heavy calcifications with multifocal areas of stenosis similar  to that seen on the prior study. Infrapopliteal vessels demonstrate heavy calcifications limiting evaluation of patency. This is similar to that seen on the prior exam. Recent angiogram obtained in December of 2022 shows the proximal infrapopliteal vessels to be patent. Primary runoff is noted posterior tibial artery. Correlate with Doppler pulses. LEFT Lower Extremity Inflow: Atherosclerotic calcifications of the iliac arteries are seen without focal hemodynamically significant stenosis. Runoff: Common femoral artery is patent as is the femoral bifurcation heavy calcifications are noted in the mid to distal superficial femoral artery with focal stenosis similar to that seen on prior arteriogram. No occlusive changes are seen. The popliteal artery also demonstrates heavy atherosclerotic calcifications. Popliteal trifurcation shows heavy calcifications. Changes of recent BKA are noted. Veins: No specific venous abnormality is noted. Review of the MIP images confirms the above findings. NON-VASCULAR Lower chest: Minimal right pleural effusion is noted. No focal infiltrate is seen. Hepatobiliary: Liver is within normal limits. Cholelithiasis is noted without complicating factors. Pancreas: Unremarkable. No pancreatic ductal dilatation or surrounding inflammatory changes. Spleen: Normal in size without focal abnormality. Adrenals/Urinary Tract: Adrenal glands are well visualized and within normal limits. Kidneys show nonobstructing stone in the lower pole of the left kidney similar to that seen prior exam. The ureters are within normal limits. The bladder is partially distended. Stomach/Bowel: No obstructive or inflammatory changes of the colon are seen. Mild diverticular changes noted without evidence of diverticulitis. Some contrast is noted in the distal  most aspect of the rectum which may represent some hemorrhoid related bleeding. Appendix has been surgically removed. Small bowel and stomach are within normal  limits. Lymphatic: No sizable lymphadenopathy is seen. Reproductive: Prostate is unremarkable. Other: No abdominal wall hernia or abnormality. No abdominopelvic ascites. Musculoskeletal: There are changes consistent with prior below-knee amputation. No bony changes to suggest osteomyelitis are seen. No acute bony abnormality is noted. There are degenerative changes of lumbar spine are seen. IMPRESSION: VASCULAR Heavy vascular calcifications are identified bilaterally with areas of stenosis in the superficial femoral arteries bilaterally similar to that seen on prior angiography from December of 2022. Distal runoff is difficult to evaluate due to heavy calcifications in the infrapopliteal vessels on the right although prior arteriography showed dominant runoff via the right posterior tibial artery. The overall appearance is similar to that seen on the prior exam. Previously described right external iliac artery stenosis is not appreciated on this exam and recent arteriography showed no focal stenosis in that region. NON-VASCULAR Evidence of cholelithiasis similar to that seen on the prior exam. Changes of prior appendectomy. Left BKA without complicating factors. Mild contrast is noted in the distal aspect of the rectum which may be related to hemorrhoidal bleed. Electronically Signed   By: Alcide Clever M.D.   On: 07/03/2021 19:45   DG Chest Portable 1 View  Result Date: 07/03/2021 CLINICAL DATA:  Chest pain. EXAM: PORTABLE CHEST 1 VIEW COMPARISON:  June 13, 2021 FINDINGS: The dialysis catheter has been removed. Tortuosity and calcific atherosclerotic disease of the aorta. Enlarged cardiac silhouette. There is no evidence of focal airspace consolidation, pleural effusion or pneumothorax. Minimal pulmonary vascular congestion. Osseous structures are without acute abnormality. Soft tissues are grossly normal. IMPRESSION: 1. Enlarged cardiac silhouette. 2. Minimal pulmonary vascular congestion. 3. Tortuosity and  calcific atherosclerotic disease of the aorta. Electronically Signed   By: Ted Mcalpine M.D.   On: 07/03/2021 17:57        Scheduled Meds:  amLODipine  10 mg Oral QHS   [START ON 07/05/2021] cloNIDine  0.1 mg Oral Q M,W,F-2000   cloNIDine  0.1 mg Oral 2 times per day on Sun Tue Thu Sat   gabapentin  100 mg Oral TID   lidocaine  1 patch Transdermal Q24H   pantoprazole  40 mg Oral BID   Continuous Infusions:   LOS: 0 days    Time spent: 35 minutes    Dorcas Carrow, MD [facility] Pager [phone]

## 2021-07-04 NOTE — Assessment & Plan Note (Signed)
#)   Right leg discomfort: 1 day of right leg pain, with distal pulses noted on bedside Doppler, as further detailed above, and without any evidence of acute process on CTA abdominal aorta with iliofemoral runoff.  However, in the setting of suspected presenting acute lower gastrointestinal bleed, increasing risk for associated type II supply/demand mismatch in the context of known peripheral artery disease, which includes a history of left BKA, EDP is consulted the on-call vascular surgeon, Dr. Carlis Abbott, who will formally consult. ?  ?Plan: Vascular surgery consulted, as above.  Further evaluation and management of presenting suspected acute lower gastrointestinal bleed, as above. ?

## 2021-07-04 NOTE — Assessment & Plan Note (Signed)
#)   ESRD: on HD (schedule: Monday, Wednesday, Friday).  Reportedly completed the totality of his HD session on 07/03/2021 before presenting to [facility] emergency department . next due for routine HD on Friday, 07/05/2021. No clinical evidence for urgent overnight HD or to expedite HD relative to this timeframe.  ?  ?Plan: monitor strict I's/O's, daily weights. CMP in the AM. Check mag and phos levels.  Continue outpatient PhosLo ?

## 2021-07-04 NOTE — Assessment & Plan Note (Signed)
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  Of note, relative to his history of chronic anemia with baseline hemoglobin in the range of 7-8, presenting hemoglobin noted to be 8.0 relative to most recent prior value of 7.8 on 06/21/2021. ?  ?Not on any blood thinners as an outpatient, including no aspirin. Denies recent alcohol abuse. INR 1.1.  Of note, CTA shows small mount of contrast in the distal aspect of the rectum, felt to potentially be consistent with hemorrhoidal bleed.  Differential for this patient suspected acute lower gastrointestinal bleed includes diverticular bleed, bleeding colonic polyps, AVM's, hemorrhoids. ?  ?Patient appears hemodynamically stable with normotensive/non-tachycardic vital signs thus far, and is currently asymptomatic.   Given suspected lower GI source, there does not appear to be an indication for initiation of Protonix drip. Of note, patient was typed and screened in the ED today.   ?  ?  ?  ?Plan:. Refraining from pharmacologic DVT prophylaxis. Monitor on telemetry. Monitor continuous pulse-ox. Maintain at least 2 large bore IV's. Check INR in the AM. Q4H H&H's have been ordered through 9 AM on 07/04/2021. Will closely monitor these ensuing Hgb levels and correlate these data points with the patient's overall clinical picture including vital signs to determine need for transfusion.  Given macrocytic finding associated  with the patient's anemia, will add on MMA and B12 level.  Additionally, in the setting of chronic oral iron supplementation, will also add on iron studies to evaluate for any opportunity for optimization from an iron deficiency standpoint. ?  ?  ?

## 2021-07-04 NOTE — Assessment & Plan Note (Signed)
#)   GERD: documented h/o such; on Protonix 40 mg p.o. twice daily as outpatient.  ?  ?Plan: continue home PPI.  ?

## 2021-07-04 NOTE — Progress Notes (Addendum)
Lattie Haw, [place] notified this RN that patient is having loose stools on bedpan with burgundy color. This RN went to assess, there was a large amount of burgundy colored stool in bedpan.  ?Notified Dr. Sloan Leiter, ordered at [facility].  ?Patient alert, oriented x4, vitals stable, BP 015A systolic.  ? ?Per Dr. Benny Lennert - "If he continues to bleed please order a CTA." ?

## 2021-07-04 NOTE — Progress Notes (Signed)
Patient has very black stool. ?

## 2021-07-04 NOTE — Telephone Encounter (Signed)
Patient currently admitted to hospital.  ?Will remove call from triage pool. ?

## 2021-07-04 NOTE — Plan of Care (Signed)
POC initiated and progressing. 

## 2021-07-04 NOTE — Assessment & Plan Note (Signed)
#)   QTc prolongation: Presenting EKG demonstrates QTc of 514 ms. no overt outpatient medications that are obviously contributing to QTc prolongation.  ?  ?  ?Plan: Monitor on telemetry.  Add-on serum magnesium level.  Repeat EKG in the morning to monitor interval degree of QTc prolongation. ?

## 2021-07-04 NOTE — Assessment & Plan Note (Signed)
#)   Essential Hypertension: documented h/o such, with outpatient antihypertensive regimen including Norvasc, oral clonidine.  SBP's in the ED today: In the 110s to 140s mmHg, without any associated hypotensive values, notable in the setting of suspected acute lower gastrointestinal bleed, as above.  In this context will hold home antihypertensive medications for now, while closely monitoring ensuing hemoglobin trend, as further outlined above. ?  ?Plan: Close monitoring of subsequent BP via routine VS. hold home antihypertensive medications for now. ?

## 2021-07-04 NOTE — Progress Notes (Addendum)
?Progress Note ? ? ? ?07/04/2021 ?7:35 AM ?* No surgery found * ? ?Subjective:  Patient is complaining of R foot pain, L BKA stump pain, and R sided chest pain ? ? ?Vitals:  ? 07/04/21 0013 07/04/21 0341  ?BP: (!) 146/80 140/86  ?Pulse: 80 76  ?Resp: 17 18  ?Temp: 98.1 ?F (36.7 ?C) 97.8 ?F (36.6 ?C)  ?SpO2: 95% 95%  ? ?Physical Exam: ?Lungs:  non labored ?Incisions:  L BKA incision with lateral wound slow to heal ?Extremities:  R LE DP and PTA brisk by doppler ?Neurologic: A&O ? ?CBC ?   ?Component Value Date/Time  ? WBC 5.5 07/04/2021 0143  ? RBC 2.15 (L) 07/04/2021 0554  ? RBC 2.29 (L) 07/04/2021 0143  ? HGB 7.9 (L) 07/04/2021 0143  ? HGB 8.4 (L) 08/22/2020 1141  ? HCT 24.0 (L) 07/04/2021 0143  ? HCT 25.0 (L) 08/22/2020 1141  ? PLT 161 07/04/2021 0143  ? PLT 184 08/22/2020 1141  ? MCV 104.8 (H) 07/04/2021 0143  ? MCV 94 08/22/2020 1141  ? MCH 34.5 (H) 07/04/2021 0143  ? MCHC 32.9 07/04/2021 0143  ? RDW 17.5 (H) 07/04/2021 0143  ? RDW 16.7 (H) 08/22/2020 1141  ? LYMPHSABS 1.9 07/04/2021 0143  ? LYMPHSABS 1.8 08/22/2020 1141  ? MONOABS 0.7 07/04/2021 0143  ? EOSABS 0.2 07/04/2021 0143  ? EOSABS 0.3 08/22/2020 1141  ? BASOSABS 0.0 07/04/2021 0143  ? BASOSABS 0.0 08/22/2020 1141  ? ? ?BMET ?   ?Component Value Date/Time  ? NA 141 07/04/2021 0143  ? NA 138 08/22/2020 1141  ? K 3.6 07/04/2021 0143  ? CL 95 (L) 07/04/2021 0143  ? CO2 29 07/04/2021 0143  ? GLUCOSE 86 07/04/2021 0143  ? BUN 12 07/04/2021 0143  ? BUN 20 08/22/2020 1141  ? CREATININE 5.13 (H) 07/04/2021 0143  ? CALCIUM 9.5 07/04/2021 0143  ? CALCIUM 7.9 (L) 08/01/2019 1610  ? GFRNONAA 12 (L) 07/04/2021 0143  ? GFRAA 7 (L) 12/28/2019 1640  ? ? ?INR ?   ?Component Value Date/Time  ? INR 1.1 07/04/2021 0554  ? ? ?No intake or output data in the 24 hours ending 07/04/21 0735 ? ? ?Assessment/Plan:  61 y.o. male with R foot pain and slow to heal L BKA ? ?R foot well perfused based on exam with brisk DP and PTA by doppler ?L BKA stump with non healing lateral  portion; will write for wet to dry dressing changes at least daily ?No indication for further workup of RLE PAD at this time.  Patient was admitted for workup/management of GI bleed ? ? ?Dagoberto Ligas, PA-C ?[facility] ?[phone] ?07/04/2021 ?7:35 AM ? ?I have seen and evaluated the patient. I agree with the PA note as documented above.  I was contacted last night from the ED with concern for ischemic right leg with no pulses.  He has a very brisk PT signal.  He is at his baseline with chronic vascular disease.  He has had pain in the right foot for 5 months.  I do think he would benefit from aortogram, right lower extremity arteriogram, and possible intervention and this can be done next week or as an outpatient given known SFA disease.  That being said, I think his GI bleed with history of AVMs is the more pressing issue at this time as we will have to anticoagulate him for right lower extremity intervention.  Can continue wet-to-dry to his left BKA.  We will follow. ? ?  Marty Heck, MD ?[facility] ?Office: [phone] ? ? ?

## 2021-07-05 ENCOUNTER — Inpatient Hospital Stay (HOSPITAL_COMMUNITY): Payer: Medicare Other | Admitting: Certified Registered Nurse Anesthetist

## 2021-07-05 ENCOUNTER — Encounter (HOSPITAL_COMMUNITY)
Admission: EM | Disposition: A | Discharge status: Skilled Nursing Facility | Payer: Self-pay | Source: Home / Self Care | Attending: Internal Medicine

## 2021-07-05 ENCOUNTER — Encounter (HOSPITAL_COMMUNITY): Payer: Self-pay | Admitting: Internal Medicine

## 2021-07-05 DIAGNOSIS — L8990 Pressure ulcer of unspecified site, unspecified stage: Secondary | ICD-10-CM | POA: Diagnosis present

## 2021-07-05 DIAGNOSIS — N186 End stage renal disease: Secondary | ICD-10-CM

## 2021-07-05 DIAGNOSIS — K31811 Angiodysplasia of stomach and duodenum with bleeding: Secondary | ICD-10-CM

## 2021-07-05 DIAGNOSIS — K922 Gastrointestinal hemorrhage, unspecified: Secondary | ICD-10-CM | POA: Diagnosis not present

## 2021-07-05 DIAGNOSIS — I120 Hypertensive chronic kidney disease with stage 5 chronic kidney disease or end stage renal disease: Secondary | ICD-10-CM

## 2021-07-05 DIAGNOSIS — K5521 Angiodysplasia of colon with hemorrhage: Secondary | ICD-10-CM

## 2021-07-05 DIAGNOSIS — K3189 Other diseases of stomach and duodenum: Secondary | ICD-10-CM

## 2021-07-05 HISTORY — PX: FLEXIBLE SIGMOIDOSCOPY: SHX5431

## 2021-07-05 HISTORY — PX: ENTEROSCOPY: SHX5533

## 2021-07-05 HISTORY — PX: HOT HEMOSTASIS: SHX5433

## 2021-07-05 LAB — HEMOGLOBIN AND HEMATOCRIT, BLOOD
HCT: 21.4 % — ABNORMAL LOW (ref 39.0–52.0)
HCT: 26 % — ABNORMAL LOW (ref 39.0–52.0)
Hemoglobin: 7 g/dL — ABNORMAL LOW (ref 13.0–17.0)
Hemoglobin: 8.7 g/dL — ABNORMAL LOW (ref 13.0–17.0)

## 2021-07-05 LAB — GLUCOSE, CAPILLARY: Glucose-Capillary: 79 mg/dL (ref 70–99)

## 2021-07-05 SURGERY — ENTEROSCOPY
Anesthesia: Monitor Anesthesia Care

## 2021-07-05 MED ORDER — PROPOFOL 500 MG/50ML IV EMUL
INTRAVENOUS | Status: DC | PRN
  Administered 2021-07-05: 100 ug/kg/min via INTRAVENOUS

## 2021-07-05 MED ORDER — SODIUM CHLORIDE 0.9 % IV SOLN: INTRAVENOUS | Status: DC | PRN

## 2021-07-05 MED ORDER — NICOTINE 14 MG/24HR TD PT24
14.0000 mg | MEDICATED_PATCH | Freq: Every day | TRANSDERMAL | Status: DC
  Administered 2021-07-05 – 2021-07-10 (×6): 14 mg via TRANSDERMAL
  Filled 2021-07-05 (×6): qty 1

## 2021-07-05 MED ORDER — SODIUM CHLORIDE 0.9 % IV SOLN
125.0000 mg | INTRAVENOUS | Status: DC
  Administered 2021-07-05 – 2021-07-10 (×3): 125 mg via INTRAVENOUS
  Filled 2021-07-05 (×4): qty 10

## 2021-07-05 MED ORDER — SODIUM CHLORIDE 0.9% IV SOLUTION: Freq: Once | INTRAVENOUS | Status: DC

## 2021-07-05 MED ORDER — PROPOFOL 10 MG/ML IV BOLUS
INTRAVENOUS | Status: DC | PRN
  Administered 2021-07-05 (×2): 10 mg via INTRAVENOUS

## 2021-07-05 NOTE — Op Note (Addendum)
[facility] ?Patient Name: Daniel [name] ?Procedure Date : 07/05/2021 ?MRN: [id number] ?Attending MD: Carol Ada , MD ?Date of Birth: 1960/06/11 ?CSN: [id number] ?Age: 61 ?Admit Type: Outpatient ?Procedure:                Small bowel enteroscopy ?Indications:              Melena ?Providers:                Carol Ada, MD, Grace Isaac, RN, Charlean Merl  ?                          Purcell Nails, Technician, Tyna Jaksch Technician,  ?                          Trixie Deis, CRNA ?Referring MD:              ?Medicines:                Propofol per Anesthesia ?Complications:            No immediate complications. ?Estimated Blood Loss:     Estimated blood loss: none. ?Procedure:                Pre-Anesthesia Assessment: ?                          - Prior to the procedure, a History and Physical  ?                          was performed, and patient medications and  ?                          allergies were reviewed. The patient's tolerance of  ?                          previous anesthesia was also reviewed. The risks  ?                          and benefits of the procedure and the sedation  ?                          options and risks were discussed with the patient.  ?                          All questions were answered, and informed consent  ?                          was obtained. Prior Anticoagulants: The patient has  ?                          taken no previous anticoagulant or antiplatelet  ?                          agents. ASA Grade Assessment: III - A patient with  ?                          severe systemic  disease. After reviewing the risks  ?                          and benefits, the patient was deemed in  ?                          satisfactory condition to undergo the procedure. ?                          - Sedation was administered by an anesthesia  ?                          professional. Deep sedation was attained. ?                          After obtaining informed consent, the endoscope was   ?                          passed under direct vision. Throughout the  ?                          procedure, the patient's blood pressure, pulse, and  ?                          oxygen saturations were monitored continuously. The  ?                          PCF-190TL ([id number]) Olympus colonoscope was  ?                          introduced through the mouth and advanced to the  ?                          small bowel distal to the Ligament of Treitz. The  ?                          small bowel enteroscopy was accomplished without  ?                          difficulty. The patient tolerated the procedure  ?                          well. ?Scope In: ?Scope Out: ?Findings: ?     The esophagus was normal. ?     Patchy moderately friable mucosa with contact bleeding was found in the  ?     entire examined stomach. ?     A few angioectasias with stigmata of recent bleeding were found in the  ?     duodenal bulb. Coagulation for hemostasis using monopolar probe was  ?     successful. ?     A single angiodysplastic lesion with no bleeding was found in the  ?     proximal jejunum. Coagulation for tissue destruction using monopolar  ?     probe was successful. ?     The gastric mucosa was friable. Biopsies were not obtained as  this was  ?     previously biopsies. The current findings are not new. In the duodenal  ?     bulb there was evidence of some recent bleeding. Just beyond the pylorus  ?     at the 11 o'clock position blood and presumed AVMs were noted. This area  ?     was ablated with APC. In the proximal jejunum there was evidence of a  ?     nonbleeding AVM. These current findings are NOT the source of his maroon  ?     stools. ?Impression:               - Normal esophagus. ?                          - Friable gastric mucosa. ?                          - A few recently bleeding angioectasias in the  ?                          duodenum. Treated with a monopolar probe. ?                          - A single  non-bleeding angiodysplastic lesion in  ?                          the jejunum. Treated with a monopolar probe. ?                          - No specimens collected. ?Recommendation:           - Proceed with the FFS. ?Procedure Code(s):        --- Professional --- ?                          [id number], Small intestinal endoscopy, enteroscopy  ?                          beyond second portion of duodenum, not including  ?                          ileum; with ablation of tumor(s), polyp(s), or  ?                          other lesion(s) not amenable to removal by hot  ?                          biopsy forceps, bipolar cautery or snare technique ?                          44366, 59, Small intestinal endoscopy, enteroscopy  ?                          beyond second portion of duodenum, not including  ?  ileum; with control of bleeding (eg, injection,  ?                          bipolar cautery, unipolar cautery, laser, heater  ?                          probe, stapler, plasma coagulator) ?Diagnosis Code(s):        --- Professional --- ?                          K55.20, Angiodysplasia of colon without hemorrhage ?                          K31.89, Other diseases of stomach and duodenum ?                          K31.811, Angiodysplasia of stomach and duodenum  ?                          with bleeding ?                          K92.1, Melena (includes Hematochezia) ?CPT copyright 2019 American Medical Association. All rights reserved. ?The codes documented in this report are preliminary and upon coder review may  ?be revised to meet current compliance requirements. ?Carol Ada, MD ?Carol Ada, MD ?07/05/2021 2:38:57 PM ?This report has been signed electronically. ?Number of Addenda: 0 ?

## 2021-07-05 NOTE — Transfer of Care (Signed)
Immediate Anesthesia Transfer of Care Note ? ?Patient: Daniel [name] ? ?Procedure(s) Performed: ENTEROSCOPY ?FLEXIBLE SIGMOIDOSCOPY ?HOT HEMOSTASIS (ARGON PLASMA COAGULATION/BICAP) ? ?Patient Location: Endoscopy Unit ? ?Anesthesia Type:MAC ? ?Level of Consciousness: awake and oriented ? ?Airway & Oxygen Therapy: Patient Spontanous Breathing ? ?Post-op Assessment: Report given to RN ? ?Post vital signs: Reviewed and stable ? ?Last Vitals:  ?Vitals Value Taken Time  ?BP 94/27 07/05/21 1437  ?Temp    ?Pulse 82 07/05/21 1437  ?Resp    ?SpO2 95 % 07/05/21 1437  ?Vitals shown include unvalidated device data. ? ?Last Pain:  ?Vitals:  ? 07/05/21 1308  ?TempSrc: Temporal  ?PainSc: 5   ?   ? ?  ? ?Complications: No notable events documented. ?

## 2021-07-05 NOTE — Progress Notes (Signed)
OT Cancellation Note ? ?Patient Details ?Name: Daniel [name] ?MRN: [id number] ?DOB: 15-Jun-1960 ? ? ?Cancelled Treatment:    Reason Eval/Treat Not Completed: Patient at procedure or test/ unavailable (HD)  ? ?Jeri Modena ?07/05/2021, 9:27 AM ? ?[place], OTR/L  ?Acute Rehabilitation Services ?Pager: [phone] ?Office: [phone] ?. ? ?

## 2021-07-05 NOTE — Anesthesia Postprocedure Evaluation (Signed)
Anesthesia Post Note ? ?Patient: DELVECCHIO MADOLE ? ?Procedure(s) Performed: ENTEROSCOPY ?FLEXIBLE SIGMOIDOSCOPY ?HOT HEMOSTASIS (ARGON PLASMA COAGULATION/BICAP) ? ?  ? ?Patient location during evaluation: Endoscopy ?Anesthesia Type: MAC ?Level of consciousness: awake and alert ?Pain management: pain level controlled ?Vital Signs Assessment: post-procedure vital signs reviewed and stable ?Respiratory status: spontaneous breathing, nonlabored ventilation, respiratory function stable and patient connected to nasal cannula oxygen ?Cardiovascular status: blood pressure returned to baseline and stable ?Postop Assessment: no apparent nausea or vomiting ?Anesthetic complications: no ? ? ?No notable events documented. ? ?Last Vitals:  ?Vitals:  ? 07/05/21 1456 07/05/21 1515  ?BP: (!) 117/35 (!) 164/67  ?Pulse: 80 81  ?Resp: 17 18  ?Temp:  36.6 ?C  ?SpO2: 94% 94%  ?  ?Last Pain:  ?Vitals:  ? 07/05/21 1438  ?TempSrc:   ?PainSc: 0-No pain  ? ? ?  ?  ?  ?  ?  ?  ? ?Barnet [place] ? ? ? ? ?

## 2021-07-05 NOTE — Progress Notes (Signed)
PT Cancellation Note ? ?Patient Details ?Name: Daniel [name] ?MRN: [id number] ?DOB: Feb 01, 1961 ? ? ?Cancelled Treatment:    Reason Eval/Treat Not Completed: Patient at procedure or test/unavailable Pt off floor at HD. Will follow up as time allows. ? ? ?[place] ?07/05/2021, 9:47 AM ?Marisa Severin, PT, DPT ?Acute Rehabilitation Services ?Pager [phone] ?Office [phone] ? ? ? ?

## 2021-07-05 NOTE — Anesthesia Preprocedure Evaluation (Addendum)
Anesthesia Evaluation  ?Patient identified by MRN, date of birth, ID band ?Patient awake ? ? ? ?Reviewed: ?Allergy & Precautions, NPO status , Patient's Chart, lab work & pertinent test results ? ?Airway ?Mallampati: II ? ?TM Distance: >3 FB ?Neck ROM: Full ? ? ? Dental ?no notable dental hx. ?(+) Dental Advisory Given, Missing,  ?  ?Pulmonary ?Current Smoker and Patient abstained from smoking.,  ?  ?Pulmonary exam normal ?breath sounds clear to auscultation ? ? ? ? ? ? Cardiovascular ?hypertension, Pt. on medications ?Normal cardiovascular exam ?Rhythm:Regular Rate:Normal ? ?06/14/2021 Echo ?Left ventricular ejection fraction, by estimation, is 55 to 60%. The left ventricle has ?normal function. The left ventricle has no regional wall motion abnormalities. The left ?ventricular internal cavity size was mildly dilated. There is mild left ventricular ?hypertrophy. Left ventricular diastolic parameters are indeterminate. ?1. ?Right ventricular systolic function is normal. The right ventricular size is normal. ?There is moderately elevated pulmonary artery systolic pressure. The estimated right ?ventricular systolic pressure is 42.6 mmHg. ?2. ?3. Left atrial size was moderately dilated. ?4. Right atrial size was mildly dilated. ?The mitral valve is normal in structure. Trivial mitral valve regurgitation. No evidence ?of mitral stenosis. ?5. ?The aortic valve is tricuspid. Aortic valve regurgitation is not visualized. Aortic valve ?sclerosis/calcification is present, without any evidence of aortic stenosis. ?6. ?The inferior vena cava is dilated in size with <50% respiratory variability, suggesting ?right atrial pressure of 15 mmHg. ?  ?Neuro/Psych ?  ? GI/Hepatic ?PUD, GERD  ,  ?Endo/Other  ?diabetes ? Renal/GU ?Dialysis and ESRFRenal diseaseMWF dialysis ? ?Lab Results ?     Component                Value               Date                 ?     CREATININE               5.13 (H)             07/04/2021           ?        K                        3.6                 07/04/2021           ?    ? ?  ?Musculoskeletal ? ? Abdominal ?(+) + obese (BMI 36),   ?Peds ? Hematology ? ?(+) Blood dyscrasia, anemia , Lab Results ?     Component                Value               Date                      ?     HGB                      7.0 (L)             07/05/2021           ?     HCT                      21.4 (L)  07/05/2021          ?     PLT                      161                 07/04/2021           ?   ?Anesthesia Other Findings ?ALL: Seroquel, Hydromorphone ? Reproductive/Obstetrics ? ?  ? ? ? ? ? ? ? ? ? ? ? ? ? ?  ?  ? ? ? ? ? ? ?Anesthesia Physical ?Anesthesia Plan ? ?ASA: 3 ? ?Anesthesia Plan: MAC  ? ?Post-op Pain Management:   ? ?Induction: Intravenous ? ?PONV Risk Score and Plan: Treatment may vary due to age or medical condition ? ?Airway Management Planned: Natural Airway and Nasal Cannula ? ?Additional Equipment: None ? ?Intra-op Plan:  ? ?Post-operative Plan:  ? ?Informed Consent: I have reviewed the patients History and Physical, chart, labs and discussed the procedure including the risks, benefits and alternatives for the proposed anesthesia with the patient or authorized representative who has indicated his/her understanding and acceptance.  ? ? ? ?Dental advisory given ? ?Plan Discussed with: CRNA and Anesthesiologist ? ?Anesthesia Plan Comments: (Bright red Blood per rectum for enteroscopy)  ? ? ? ? ? ?Anesthesia Quick Evaluation ? ?

## 2021-07-05 NOTE — Interval H&P Note (Signed)
History and Physical Interval Note: ? ?07/05/2021 ?1:58 PM ? ?Daniel [name]  has presented today for surgery, with the diagnosis of GI bleed.  The various methods of treatment have been discussed with the patient and family. After consideration of risks, benefits and other options for treatment, the patient has consented to  Procedure(s): ?ENTEROSCOPY (N/A) ?FLEXIBLE SIGMOIDOSCOPY (N/A) as a surgical intervention.  The patient's history has been reviewed, patient examined, no change in status, stable for surgery.  I have reviewed the patient's chart and labs.  Questions were answered to the patient's satisfaction.   ? ? ?[name] ? ? ?

## 2021-07-05 NOTE — Anesthesia Procedure Notes (Signed)
Procedure Name: [place] ?Date/Time: 07/05/2021 2:04 PM ?Performed by: Harden Mo, CRNA ?Pre-anesthesia Checklist: Patient identified, Suction available, Emergency Drugs available and Patient being monitored ?Patient Re-evaluated:Patient Re-evaluated prior to induction ?Oxygen Delivery Method: Nasal cannula ?Preoxygenation: Pre-oxygenation with 100% oxygen ?Induction Type: IV induction ?Placement Confirmation: positive ETCO2 and breath sounds checked- equal and bilateral ?Dental Injury: Teeth and Oropharynx as per pre-operative assessment  ? ? ? ? ?

## 2021-07-05 NOTE — Plan of Care (Signed)
?  Problem: Education: Goal: Knowledge of General Education information will improve Description: Including pain rating scale, medication(s)/side effects and non-pharmacologic comfort measures Outcome: Progressing   Problem: Health Behavior/Discharge Planning: Goal: Ability to manage health-related needs will improve Outcome: Progressing   Problem: Clinical Measurements: Goal: Ability to maintain clinical measurements within normal limits will improve Outcome: Progressing Goal: Will remain free from infection Outcome: Progressing Goal: Diagnostic test results will improve Outcome: Progressing Goal: Respiratory complications will improve Outcome: Progressing Goal: Cardiovascular complication will be avoided Outcome: Progressing   Problem: Skin Integrity: Goal: Risk for impaired skin integrity will decrease Outcome: Progressing   Problem: Safety: Goal: Ability to remain free from injury will improve Outcome: Progressing   Problem: Pain Managment: Goal: General experience of comfort will improve Outcome: Progressing   

## 2021-07-05 NOTE — Progress Notes (Signed)
[facility] ?Progress Note ? ?Subjective: seen on HD, +bloody stool overnight. Hb down to 7.0 today ? ?Vitals:  ? 07/05/21 0930 07/05/21 0945 07/05/21 1000 07/05/21 1042  ?BP: (!) 168/87 (!) 169/87 (!) 156/63 (!) 170/77  ?Pulse: 85 85 86 85  ?Resp: '19 20 20 '$ (!) 21  ?Temp:  98.4 ?F (36.9 ?C)    ?TempSrc:      ?SpO2: 95% 95%    ?Weight:      ?Height:      ? ? ?Exam: ?Gen alert, no distress ?No rash, cyanosis or gangrene ?Sclera anicteric, throat clear  ?No jvd or bruits ?Chest clear bilat to bases, no rales/ wheezing ?RRR no RG ?Abd soft ntnd no mass or ascites +bs obese ?GU normal male ?MS L BKA wrapped ?Ext no LE or UE edema, no wounds or ulcers ?Neuro is alert, Ox 3 , nf ? LUA AVF +bruit ?  ?  ?  ? Home meds include - norvasc, phoslo 2 ac, clonidine 0.1 mwf, neurontin 100 tid, lopressor '100mg'$  prn, MVI, prosouce plus, protonix, florastor, prns/ vits/ supps ?  ?  ?  ? OP HD: MWF  ?  4h  450/500   119kg   2/2 bath  Hep none  LUA AVF ? - venofer '100mg'$  q HD, 7 left ? - mircera 225 q2, last 3/08 ? - hectorol 1 ug tiw IV ?  ?  ?Assessment/ Plan: ?Melena - History of PUD and bleeding AVMs. Per GI/ pmd.  ?ESRD- on HD MWF. HD this am in progress.  ?BP/ volume - cont BP medications, no vol excess, on RA, at dry wt  ?Anemia ckd - Hb down to low 7's,  next esa due 3/22, cont IV Fe load. Transfuse prn per pmd.  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 Ca in range.  ? ? ? ? ?Rob Doctor, hospital ?07/05/2021, 11:52 AM ? ? ?Recent Labs  ?Lab 07/03/21 ?1700 07/03/21 ?2144 07/04/21 ?0143 07/04/21 ?0233 07/04/21 ?1815 07/05/21 ?0507  ?HGB 8.0*   < > 7.9*   < > 7.3* 7.0*  ?ALBUMIN 2.6*  --  2.5*  --   --   --   ?CALCIUM 8.8*  --  9.5  --   --   --   ?CREATININE 4.25*  --  5.13*  --   --   --   ?K 4.9  --  3.6  --   --   --   ? < > = values in this interval not displayed.  ? ?Inpatient medications: ? sodium chloride   Intravenous Once  ? amLODipine  10 mg Oral QHS  ? Chlorhexidine Gluconate Cloth  6 each Topical Q0600  ? cloNIDine  0.1 mg  Oral Q M,W,F-2000  ? cloNIDine  0.1 mg Oral 2 times per day on Sun Tue Thu Sat  ? gabapentin  100 mg Oral TID  ? hydrocortisone cream   Topical BID  ? lidocaine  1 patch Transdermal Q24H  ? nicotine  14 mg Transdermal Daily  ? pantoprazole  40 mg Oral BID  ? ? ?acetaminophen **OR** acetaminophen, hydrOXYzine, oxyCODONE-acetaminophen ? ? ? ? ? ? ?

## 2021-07-05 NOTE — Progress Notes (Signed)
PROGRESS NOTE    Daniel [name]  FYT:[id number] DOB: 1961-02-13 DOA: 07/03/2021 PCP: [name], NP    Brief Narrative:  61 year old with history of ESRD on hemodialysis Monday Wednesday Friday, anemia of chronic disease with baseline hemoglobin 7-8, multiple AV malformations GI tract, recent left below-knee amputation and currently at a skilled nursing facility brought to emergency room from dialysis center after he had a large volume fresh rectal bleed after completing dialysis on 3/8. Patient was mainly complaining of right-sided chest pain, lot of pain on his left below-knee amputation stump and also on the right leg.  Denied any nausea vomiting or abdominal pain or distention. Seen by vascular surgery on arrival.  CT angiogram lower extremities without evidence of acute blockages.   Assessment & Plan:   Acute lower GI bleeding, acute on chronic anemia of blood loss: Acute blood loss anemia. Known previous AVMs. Patient had multiple fresh and maroon-colored stool overnight.   Hemoglobin gradually drifted down from 8 -7.4 -7.  Continue to monitor hemoglobin every 12 hours.  Currently hemodynamically stable.   Seen by gastroenterology, scheduled for enteroscopy /sigmoidoscopy today.   CT angiogram of the abdomen done on admission shows pooling of blood on rectal area.   Not on antiplatelets or antithrombotics. Dialysis patient with significant cardiovascular comorbidities, will keep hemoglobin more than 8.  1 unit PRBC transfusion today.  Patient consented.  Right-sided chest pain: Complaint of severe pleuritic chest pain, CT angiogram of the chest negative for pulmonary embolism.  Musculoskeletal pain.  Lidocaine patch and Percocet.  Severe peripheral vascular disease status post left below-knee amputation: Seen by vascular surgery.  Symptomatic treatment suggested.  CT angiogram without any evidence of acute arterial compromise.  Essential hypertension: Blood pressure is  stable.  Home medications resumed.  ESRD on hemodialysis: Getting hemodialysis today.   DVT prophylaxis: SCDs Start: 07/03/21 2110   Code Status: Full code Family Communication: None Disposition Plan: Status is: Inpatient.  Patient remains inpatient because of need for inpatient therapy, blood transfusions and scheduled endoscopy procedures.     Consultants:  Gastroenterology,  Nephrology,   Procedures:  None  Antimicrobials:  None   Subjective:  Patient seen and examined.  Getting hemodialysis at this time.  Pleasant.  Denies any nausea vomiting or abdominal pain.  Had 2-3 episodes of maroon-colored stool last evening.  Blood pressure stable.  Consented earlier for blood transfusion.  Objective: Vitals:   07/05/21 0930 07/05/21 0945 07/05/21 1000 07/05/21 1042  BP: (!) 168/87 (!) 169/87 (!) 156/63 (!) 170/77  Pulse: 85 85 86 85  Resp: '19 20 20 '$ (!) 21  Temp:  98.4 F (36.9 C)    TempSrc:      SpO2: 95% 95%    Weight:      Height:        Intake/Output Summary (Last 24 hours) at 07/05/2021 1118 Last data filed at 07/05/2021 1042 Gross per 24 hour  Intake 775 ml  Output 1910 ml  Net -1135 ml   Filed Weights   07/04/21 0500 07/05/21 0300 07/05/21 0738  Weight: 117.9 kg 118.2 kg 118.2 kg    Examination:  General exam: Appears calm and comfortable  Not in any distress.  Currently receiving hemodialysis. Respiratory system: Clear to auscultation. Respiratory effort normal.  No added sounds. Reproducible chest pain right anterior chest wall.  He also has superficial skin scars but no evidence of active infective lesions. Cardiovascular system: S1 & S2 heard, RRR. No JVD, murmurs, rubs, gallops or  clicks.  Gastrointestinal system: Abdomen is nondistended, soft and nontender. No organomegaly or masses felt. Normal bowel sounds heard. Central nervous system: Alert and oriented. No focal neurological deficits. AV fistula with thrill left upper extremity.  Receiving  dialysis. Left below-knee amputation stump with some dehiscence, clean and dry.  Pictures in the chart. Right foot with chronic ischemic changes.  Dopplerable pulses present.   Data Reviewed: I have personally reviewed following labs and imaging studies  CBC: Recent Labs  Lab 07/03/21 1700 07/03/21 2144 07/04/21 0143 07/04/21 0906 07/04/21 1642 07/04/21 1815 07/05/21 0507  WBC 4.4  --  5.5  --   --   --   --   NEUTROABS 2.2  --  2.6  --   --   --   --   HGB 8.0*   < > 7.9* 7.4* 7.6* 7.3* 7.0*  HCT 25.3*   < > 24.0* 22.6* 22.6* 23.2* 21.4*  MCV 108.1*  --  104.8*  --   --   --   --   PLT 173  --  161  --   --   --   --    < > = values in this interval not displayed.   Basic Metabolic Panel: Recent Labs  Lab 07/03/21 1700 07/04/21 0143  NA 140 141  K 4.9 3.6  CL 95* 95*  CO2 27 29  GLUCOSE 94 86  BUN 11 12  CREATININE 4.25* 5.13*  CALCIUM 8.8* 9.5  MG  --  2.0   GFR: Estimated Creatinine Clearance: 19.8 mL/min (A) (by C-G formula based on SCr of 5.13 mg/dL (H)). Liver Function Tests: Recent Labs  Lab 07/03/21 1700 07/04/21 0143  AST 82* 51*  ALT 16 16  ALKPHOS 64 59  BILITOT 2.0* 0.3  PROT 7.7 8.1  ALBUMIN 2.6* 2.5*   No results for input(s): LIPASE, AMYLASE in the last 168 hours. No results for input(s): AMMONIA in the last 168 hours. Coagulation Profile: Recent Labs  Lab 07/03/21 1700 07/04/21 0554  INR 1.1 1.1   Cardiac Enzymes: No results for input(s): CKTOTAL, CKMB, CKMBINDEX, TROPONINI in the last 168 hours. BNP (last 3 results) No results for input(s): PROBNP in the last 8760 hours. HbA1C: No results for input(s): HGBA1C in the last 72 hours. CBG: No results for input(s): GLUCAP in the last 168 hours. Lipid Profile: No results for input(s): CHOL, HDL, LDLCALC, TRIG, CHOLHDL, LDLDIRECT in the last 72 hours. Thyroid Function Tests: No results for input(s): TSH, T4TOTAL, FREET4, T3FREE, THYROIDAB in the last 72 hours. Anemia Panel: Recent  Labs    07/04/21 0554  FOLATE 11.9  FERRITIN 218  TIBC 361  IRON 159  RETICCTPCT 5.5*   Sepsis Labs: No results for input(s): PROCALCITON, LATICACIDVEN in the last 168 hours.  Recent Results (from the past 240 hour(s))  Resp Panel by RT-PCR (Flu A&B, Covid) Nasopharyngeal Swab     Status: None   Collection Time: 07/03/21  9:07 PM   Specimen: Nasopharyngeal Swab; Nasopharyngeal(NP) swabs in vial transport medium  Result Value Ref Range Status   SARS Coronavirus 2 by RT PCR NEGATIVE NEGATIVE Final    Comment: (NOTE) SARS-CoV-2 target nucleic acids are NOT DETECTED.  The SARS-CoV-2 RNA is generally detectable in upper respiratory specimens during the acute phase of infection. The lowest concentration of SARS-CoV-2 viral copies this assay can detect is 138 copies/mL. A negative result does not preclude SARS-Cov-2 infection and should not be used as the sole basis for treatment  or other patient management decisions. A negative result may occur with  improper specimen collection/handling, submission of specimen other than nasopharyngeal swab, presence of viral mutation(s) within the areas targeted by this assay, and inadequate number of viral copies(<138 copies/mL). A negative result must be combined with clinical observations, patient history, and epidemiological information. The expected result is Negative.  Fact Sheet for Patients:  EntrepreneurPulse.com.au  Fact Sheet for Healthcare Providers:  IncredibleEmployment.be  This test is no t yet approved or cleared by the [place] FDA and  has been authorized for detection and/or diagnosis of SARS-CoV-2 by FDA under an Emergency Use Authorization (EUA). This EUA will remain  in effect (meaning this test can be used) for the duration of the COVID-19 declaration under Section 564(b)(1) of the Act, 21 U.S.C.section 360bbb-3(b)(1), unless the authorization is terminated  or revoked sooner.        Influenza A by PCR NEGATIVE NEGATIVE Final   Influenza B by PCR NEGATIVE NEGATIVE Final    Comment: (NOTE) The Xpert Xpress SARS-CoV-2/FLU/RSV plus assay is intended as an aid in the diagnosis of influenza from Nasopharyngeal swab specimens and should not be used as a sole basis for treatment. Nasal washings and aspirates are unacceptable for Xpert Xpress SARS-CoV-2/FLU/RSV testing.  Fact Sheet for Patients: EntrepreneurPulse.com.au  Fact Sheet for Healthcare Providers: IncredibleEmployment.be  This test is not yet approved or cleared by the [place] FDA and has been authorized for detection and/or diagnosis of SARS-CoV-2 by FDA under an Emergency Use Authorization (EUA). This EUA will remain in effect (meaning this test can be used) for the duration of the COVID-19 declaration under Section 564(b)(1) of the Act, 21 U.S.C. section 360bbb-3(b)(1), unless the authorization is terminated or revoked.  Performed at [facility], [place] [address], [place], [place] [address]          Radiology Studies: CT Angio Chest Pulmonary Embolism (PE) W or WO Contrast  Result Date: 07/04/2021 CLINICAL DATA:  Chest pain or SOB, pleurisy or effusion suspected EXAM: CT ANGIOGRAPHY CHEST WITH CONTRAST TECHNIQUE: Multidetector CT imaging of the chest was performed using the standard protocol during bolus administration of intravenous contrast. Multiplanar CT image reconstructions and MIPs were obtained to evaluate the vascular anatomy. RADIATION DOSE REDUCTION: This exam was performed according to the departmental dose-optimization program which includes automated exposure control, adjustment of the mA and/or kV according to patient size and/or use of iterative reconstruction technique. CONTRAST:  162m OMNIPAQUE IOHEXOL 350 MG/ML SOLN COMPARISON:  X-ray 07/03/2021, CT 06/29/2019 FINDINGS: Cardiovascular: Satisfactory opacification of the pulmonary  arteries to the segmental level. Evaluation of the distal pulmonary arterial branches is degraded by respiratory motion artifact and beam hardening artifact. No evidence of pulmonary embolism to the lobar branch level. Thoracic aorta is nonaneurysmal. Atherosclerotic calcifications of the aorta and coronary arteries. Stable cardiomegaly. No pericardial effusion. Mediastinum/Nodes: Mildly prominent precarinal lymph node measuring 11 mm short axis (series 6, image 92). Right hilar lymph node measures 13 mm short axis (series 6, image 115). No axillary lymphadenopathy. Trachea, thyroid gland, and esophagus appear within normal limits. Lungs/Pleura: Low lung volumes with respiratory motion artifact. Rounded 2.2 cm subsolid density within the superior aspect of the right lower lobe (series 7, image 62) was present on the previous CT from 2021 however demonstrates increased solid component on the current exam. Interlobular septal thickening. Small bilateral pleural effusions with bibasilar atelectasis. Upper Abdomen: No acute abnormality. Musculoskeletal: No chest wall abnormality. No acute or significant osseous findings. Review  of the MIP images confirms the above findings. IMPRESSION: 1. No evidence of pulmonary embolism to the lobar branch level. 2. Cardiomegaly with evidence of interstitial pulmonary edema including small bilateral pleural effusions and bibasilar atelectasis. 3. Rounded 2.2 cm subsolid density within the superior aspect of the right lower lobe was present on the previous CT from 2021 however demonstrates increased solid component on the current exam. Adenocarcinoma is considered. Thoracic surgery consultation is recommended. PET/CT should be considered for staging purposes. These recommendations are taken from: Recommendations for the Management of Subsolid Pulmonary Nodules Detected at CT: A Statement from the [facility] 2013; 266:1, 304-317. 4. Mildly prominent mediastinal and  right hilar lymph nodes, nonspecific, but may be reactive. 5. Aortic Atherosclerosis (ICD10-I70.0). Electronically Signed   By: Davina Poke D.O.   On: 07/04/2021 08:55   CT Angio Aortobifemoral W and/or Wo Contrast  Result Date: 07/03/2021 CLINICAL DATA:  History of left BKA due to known peripheral vascular disease with right leg pain, initial encounter EXAM: CT ANGIOGRAPHY OF ABDOMINAL AORTA WITH ILIOFEMORAL RUNOFF TECHNIQUE: Multidetector CT imaging of the abdomen, pelvis and lower extremities was performed using the standard protocol during bolus administration of intravenous contrast. Multiplanar CT image reconstructions and MIPs were obtained to evaluate the vascular anatomy. RADIATION DOSE REDUCTION: This exam was performed according to the departmental dose-optimization program which includes automated exposure control, adjustment of the mA and/or kV according to patient size and/or use of iterative reconstruction technique. CONTRAST:  164m OMNIPAQUE IOHEXOL 350 MG/ML SOLN COMPARISON:  03/19/2021 CT, catheter based angiography from 04/08/21 FINDINGS: VASCULAR Aorta: Atherosclerotic calcifications are noted without aneurysmal dilatation or dissection. Celiac: Atherosclerotic calcifications are noted. No focal stenosis or aneurysmal dilatation is noted. SMA: Patent without evidence of aneurysm, dissection, vasculitis or significant stenosis. Renals: Single renal arteries are identified bilaterally with mild atherosclerotic change. IMA: Patent without evidence of aneurysm, dissection, vasculitis or significant stenosis. RIGHT Lower Extremity Inflow: Right iliac artery demonstrates atherosclerotic calcifications. Previously seen stenosis in the external iliac artery is not as well appreciated on today's exam. Runoff: Common femoral artery is calcified. Femoral bifurcation is patent. Superficial femoral artery demonstrates calcifications throughout its course with heavy focal calcification in the mid  thigh similar to that seen on the prior exam. Focal stenosis in this area is noted as well. Popliteal artery also demonstrates heavy calcifications with multifocal areas of stenosis similar to that seen on the prior study. Infrapopliteal vessels demonstrate heavy calcifications limiting evaluation of patency. This is similar to that seen on the prior exam. Recent angiogram obtained in December of 2022 shows the proximal infrapopliteal vessels to be patent. Primary runoff is noted posterior tibial artery. Correlate with Doppler pulses. LEFT Lower Extremity Inflow: Atherosclerotic calcifications of the iliac arteries are seen without focal hemodynamically significant stenosis. Runoff: Common femoral artery is patent as is the femoral bifurcation heavy calcifications are noted in the mid to distal superficial femoral artery with focal stenosis similar to that seen on prior arteriogram. No occlusive changes are seen. The popliteal artery also demonstrates heavy atherosclerotic calcifications. Popliteal trifurcation shows heavy calcifications. Changes of recent BKA are noted. Veins: No specific venous abnormality is noted. Review of the MIP images confirms the above findings. NON-VASCULAR Lower chest: Minimal right pleural effusion is noted. No focal infiltrate is seen. Hepatobiliary: Liver is within normal limits. Cholelithiasis is noted without complicating factors. Pancreas: Unremarkable. No pancreatic ductal dilatation or surrounding inflammatory changes. Spleen: Normal in size without focal abnormality. Adrenals/Urinary Tract: Adrenal glands are  well visualized and within normal limits. Kidneys show nonobstructing stone in the lower pole of the left kidney similar to that seen prior exam. The ureters are within normal limits. The bladder is partially distended. Stomach/Bowel: No obstructive or inflammatory changes of the colon are seen. Mild diverticular changes noted without evidence of diverticulitis. Some  contrast is noted in the distal most aspect of the rectum which may represent some hemorrhoid related bleeding. Appendix has been surgically removed. Small bowel and stomach are within normal limits. Lymphatic: No sizable lymphadenopathy is seen. Reproductive: Prostate is unremarkable. Other: No abdominal wall hernia or abnormality. No abdominopelvic ascites. Musculoskeletal: There are changes consistent with prior below-knee amputation. No bony changes to suggest osteomyelitis are seen. No acute bony abnormality is noted. There are degenerative changes of lumbar spine are seen. IMPRESSION: VASCULAR Heavy vascular calcifications are identified bilaterally with areas of stenosis in the superficial femoral arteries bilaterally similar to that seen on prior angiography from December of 2022. Distal runoff is difficult to evaluate due to heavy calcifications in the infrapopliteal vessels on the right although prior arteriography showed dominant runoff via the right posterior tibial artery. The overall appearance is similar to that seen on the prior exam. Previously described right external iliac artery stenosis is not appreciated on this exam and recent arteriography showed no focal stenosis in that region. NON-VASCULAR Evidence of cholelithiasis similar to that seen on the prior exam. Changes of prior appendectomy. Left BKA without complicating factors. Mild contrast is noted in the distal aspect of the rectum which may be related to hemorrhoidal bleed. Electronically Signed   By: Inez Catalina M.D.   On: 07/03/2021 19:45   DG Chest Portable 1 View  Result Date: 07/03/2021 CLINICAL DATA:  Chest pain. EXAM: PORTABLE CHEST 1 VIEW COMPARISON:  June 13, 2021 FINDINGS: The dialysis catheter has been removed. Tortuosity and calcific atherosclerotic disease of the aorta. Enlarged cardiac silhouette. There is no evidence of focal airspace consolidation, pleural effusion or pneumothorax. Minimal pulmonary vascular  congestion. Osseous structures are without acute abnormality. Soft tissues are grossly normal. IMPRESSION: 1. Enlarged cardiac silhouette. 2. Minimal pulmonary vascular congestion. 3. Tortuosity and calcific atherosclerotic disease of the aorta. Electronically Signed   By: Fidela Salisbury M.D.   On: 07/03/2021 17:57        Scheduled Meds:  sodium chloride   Intravenous Once   amLODipine  10 mg Oral QHS   Chlorhexidine Gluconate Cloth  6 each Topical Q0600   cloNIDine  0.1 mg Oral Q M,W,F-2000   cloNIDine  0.1 mg Oral 2 times per day on Sun Tue Thu Sat   gabapentin  100 mg Oral TID   hydrocortisone cream   Topical BID   lidocaine  1 patch Transdermal Q24H   nicotine  14 mg Transdermal Daily   pantoprazole  40 mg Oral BID   Continuous Infusions:   LOS: 1 day    Time spent: 35 minutes    Barb Merino, MD [facility] Pager [phone]

## 2021-07-05 NOTE — Op Note (Signed)
[facility] ?Patient Name: Daniel [name] ?Procedure Date : 07/05/2021 ?MRN: [id number] ?Attending MD: Carol Ada , MD ?Date of Birth: 1960-06-11 ?CSN: [id number] ?Age: 61 ?Admit Type: Inpatient ?Procedure:                Flexible Sigmoidoscopy ?Indications:              Hematochezia ?Providers:                Carol Ada, MD, Grace Isaac, RN, Charlean Merl  ?                          Purcell Nails, Technician ?Referring MD:              ?Medicines:                Propofol per Anesthesia ?Complications:            No immediate complications. ?Estimated Blood Loss:     Estimated blood loss: none. ?Procedure:                Pre-Anesthesia Assessment: ?                          - Prior to the procedure, a History and Physical  ?                          was performed, and patient medications and  ?                          allergies were reviewed. The patient's tolerance of  ?                          previous anesthesia was also reviewed. The risks  ?                          and benefits of the procedure and the sedation  ?                          options and risks were discussed with the patient.  ?                          All questions were answered, and informed consent  ?                          was obtained. Prior Anticoagulants: The patient has  ?                          taken no previous anticoagulant or antiplatelet  ?                          agents. ASA Grade Assessment: III - A patient with  ?                          severe systemic disease. After reviewing the risks  ?                          and benefits,  the patient was deemed in  ?                          satisfactory condition to undergo the procedure. ?                          - Sedation was administered by an anesthesia  ?                          professional. Deep sedation was attained. ?                          After obtaining informed consent, the scope was  ?                          passed under direct vision.The flexible  ?                           sigmoidoscopy was accomplished without difficulty.  ?                          The patient tolerated the procedure well. The  ?                          quality of the bowel preparation was adequate. The  ?                          PCF-190TL ([id number]) Olympus colonoscope was  ?                          introduced through the anus and advanced to the the  ?                          sigmoid colon. ?Scope In: ?Scope Out: ?Findings: ?     Red blood was found in the rectum, in the recto-sigmoid colon and in the  ?     sigmoid colon. ?     The current findings, his history of diverticula, and the clinical  ?     presentation are consistent with a diverticular bleed. ?Impression:               - Blood in the rectum, in the recto-sigmoid colon  ?                          and in the sigmoid colon. ?                          - No specimens collected. ?Recommendation:           - Follow HGB and transfuse as necessary. ?                          - If he has recurrent severe hematochezia and/or  ?                          significant transfusion requirements, a CTA of the  ?  abdomen/pelvis is recommended. ?Procedure Code(s):        --- Professional --- ?                          [phone], Sigmoidoscopy, flexible; diagnostic,  ?                          including collection of specimen(s) by brushing or  ?                          washing, when performed (separate procedure) ?Diagnosis Code(s):        --- Professional --- ?                          K62.5, Hemorrhage of anus and rectum ?                          K92.2, Gastrointestinal hemorrhage, unspecified ?                          K92.1, Melena (includes Hematochezia) ?CPT copyright 2019 American Medical Association. All rights reserved. ?The codes documented in this report are preliminary and upon coder review may  ?be revised to meet current compliance requirements. ?Carol Ada, MD ?Carol Ada, MD ?07/05/2021 2:47:50 PM ?This  report has been signed electronically. ?Number of Addenda: 0 ?

## 2021-07-05 NOTE — Progress Notes (Signed)
Pt off floor for treatment   

## 2021-07-06 DIAGNOSIS — K922 Gastrointestinal hemorrhage, unspecified: Secondary | ICD-10-CM | POA: Diagnosis not present

## 2021-07-06 DIAGNOSIS — D500 Iron deficiency anemia secondary to blood loss (chronic): Secondary | ICD-10-CM | POA: Diagnosis not present

## 2021-07-06 LAB — TYPE AND SCREEN
ABO/RH(D): B POS
Antibody Screen: NEGATIVE
Unit division: 0

## 2021-07-06 LAB — HEMOGLOBIN AND HEMATOCRIT, BLOOD
HCT: 24.6 % — ABNORMAL LOW (ref 39.0–52.0)
HCT: 25.2 % — ABNORMAL LOW (ref 39.0–52.0)
Hemoglobin: 7.9 g/dL — ABNORMAL LOW (ref 13.0–17.0)
Hemoglobin: 8 g/dL — ABNORMAL LOW (ref 13.0–17.0)

## 2021-07-06 LAB — BPAM RBC
Blood Product Expiration Date: 202303252359
ISSUE DATE / TIME: 202303100819
Unit Type and Rh: 7300

## 2021-07-06 LAB — METHYLMALONIC ACID, SERUM: Methylmalonic Acid, Quantitative: 545 nmol/L — ABNORMAL HIGH (ref 0–378)

## 2021-07-06 MED ORDER — HYDROCODONE-ACETAMINOPHEN 5-325 MG PO TABS: 1.0000 | ORAL_TABLET | Freq: Four times a day (QID) | ORAL | 0 refills | Status: AC | PRN

## 2021-07-06 MED ORDER — OXYCODONE-ACETAMINOPHEN 5-325 MG PO TABS
1.0000 | ORAL_TABLET | Freq: Once | ORAL | Status: AC
  Administered 2021-07-06: 1 via ORAL
  Filled 2021-07-06: qty 1

## 2021-07-06 MED ORDER — OXYCODONE-ACETAMINOPHEN 5-325 MG PO TABS
2.0000 | ORAL_TABLET | Freq: Four times a day (QID) | ORAL | Status: DC | PRN
Start: 2021-07-06 — End: 2021-07-07
  Administered 2021-07-06 – 2021-07-07 (×2): 2 via ORAL
  Filled 2021-07-06 (×2): qty 2

## 2021-07-06 NOTE — Progress Notes (Addendum)
? ? ? ?[facility] Progress Note ? ?CC:  What are you going to do about my leg ? ?Assessment / Plan: ?Acute GI bleeding with associated GI blood loss anemia. CTA 07/03/21 showed pooling in the rectum that the radiologist suggested might be due to hemorrhoidal bleeding. No clear source on push enteroscopy or flexible sigmoidoscopy with Dr. Benson Norway 07/05/21. However, blood present throughout the examined colon.  In addition, he has been evaluated for recurrent GI bleeding several times of the last year without an obvious source identified on multiple EGDs and non-contrasted CT scans of the abd/pelvis.  Colonoscopy with Dr. Cristina Gong in 2021 (the last time he had a full colonoscopy) showed sigmoid diverticulosis, making a diverticular bleed a possibility. Not on antiplatelets or antithrombotics. Slight decrease in hemoglobin over the last 24 hours. Recommend CTA with additional significant overt bleeding. Consider full colonoscopy after a full bowel prep if there is continued slow bleeding. Continue serial hgb/hct with transfusion as indicated in the meantime.  ? ?History of known AVMs: Identified small bowel AVMs treated yesterday at time of procedure but not thought to be the source of hematochezia.  More distal AVMs are a consideration.  ? ?Patient concerns about PVD. I've encouraged him to discuss his concerns with the hospitalist or vascular surgeon.  ? ?Subsolid density in the RLL on CTA of the chest 07/04/21: Radiology recommended thoracic surgery consultation. Will defer to hospitalist team.  ? ?I am covering for Dr. Benson Norway who will be back 07/08/21.  ? ?Subjective: ?Small bowel enteroscopy and flex sig with Dr. Benson Norway yesterday showed gastric and small bowel AVMs that were not thought to be the source for bleeding. The examined colon was full of blood. ? ? The patient denies any bleeding following the procedures. His complaint today is regarding his right foot. GI ROS is negative.  ? ?Objective:  ?Vital signs  in last 24 hours: ?Temp:  [97.8 ?F (36.6 ?C)-99.5 ?F (37.5 ?C)] 97.8 ?F (36.6 ?C) (03/11 0750) ?Pulse Rate:  [80-95] 84 (03/11 0750) ?Resp:  [16-23] 18 (03/11 0750) ?BP: (94-170)/(27-92) 140/78 (03/11 0750) ?SpO2:  [92 %-99 %] 97 % (03/11 0750) ?Weight:  [528 kg] 117 kg (03/11 0450) ?Last BM Date : 07/05/21 ?General:   Alert, in NAD ?Heart:  Regular rate and rhythm; no murmurs ?Pulm: Clear anteriorly; no wheezing ?Abdomen:  Soft. Central obesity. Nontender. Nondistended. Normal bowel sounds. No rebound or guarding. ?LAD: No inguinal or umbilical LAD ?Extremities:  Left BKA stump present. Right foot with chronic ischemic changes.[name] ?Neurologic:  Alert and  oriented x4;  grossly normal neurologically. ?Psych:  Alert and cooperative. Normal mood and affect. ? ? ?Lab Results: ?Recent Labs  ?  07/03/21 ?1700 07/03/21 ?2144 07/04/21 ?0143 07/04/21 ?4132 07/05/21 ?0507 07/05/21 ?1729 07/06/21 ?4401  ?WBC 4.4  --  5.5  --   --   --   --   ?HGB 8.0*   < > 7.9*   < > 7.0* 8.7* 7.9*  ?HCT 25.3*   < > 24.0*   < > 21.4* 26.0* 24.6*  ?PLT 173  --  161  --   --   --   --   ? < > = values in this interval not displayed.  ? ?BMET ?Recent Labs  ?  07/03/21 ?1700 07/04/21 ?0143  ?NA 140 141  ?K 4.9 3.6  ?CL 95* 95*  ?CO2 27 29  ?GLUCOSE 94 86  ?BUN 11 12  ?CREATININE 4.25* 5.13*  ?CALCIUM 8.8* 9.5  ? ?  LFT ?Recent Labs  ?  07/04/21 ?0143  ?PROT 8.1  ?ALBUMIN 2.5*  ?AST 51*  ?ALT 16  ?ALKPHOS 59  ?BILITOT 0.3  ? ?PT/INR ?Recent Labs  ?  07/03/21 ?1700 07/04/21 ?0722  ?LABPROT 13.7 14.3  ?INR 1.1 1.1  ? ? ? ?Push enteroscopy: Gastric and duodenal AVMs identified and treated with APC but Dr. Benson Norway did not feel these were the source of his hematochezia.  ? ?Flexible sigmoidoscopy: Blood throughout the examined colon. No specific source identified.  Bleeding scan recommended.  ? ? LOS: 2 days  ? ?Thornton [place]  07/06/2021, 8:57 AM ? ?  ?

## 2021-07-06 NOTE — Progress Notes (Signed)
[facility] ?Progress Note ? ?Subjective: Hb 7.9 today, sp 1u prbc's total here.  ? ?Vitals:  ? 07/06/21 0438 07/06/21 0450 07/06/21 0750 07/06/21 1128  ?BP: (!) 148/92  140/78 140/78  ?Pulse: 84  84 82  ?Resp: '18  18 19  '$ ?Temp: 97.8 ?F (36.6 ?C)  97.8 ?F (36.6 ?C) 97.8 ?F (36.6 ?C)  ?TempSrc: Oral  Oral Oral  ?SpO2: 97%  97% 97%  ?Weight:  117 kg    ?Height:      ? ? ?Exam: ? alert, nad  ? no jvd ? Chest cta bilat ? Cor reg no RG ? Abd soft ntnd no ascites ? MS L BKA wrapped ?Ext no LE or UE edema ?Neuro is alert, Ox 3 , nf ? LUA AVF +bruit ? ? ?  ?  ?  ? Home meds include - norvasc, phoslo 2 ac, clonidine 0.1 mwf, neurontin 100 tid, lopressor '100mg'$  prn, MVI, prosouce plus, protonix, florastor, prns/ vits/ supps ?  ?  ?  ? OP HD: MWF  ?  4h  450/500   119kg   2/2 bath  Hep none  LUA AVF ? - venofer '100mg'$  q HD, 7 left ? - mircera 225 q2, last 3/08 ? - hectorol 1 ug tiw IV ?  ?  ?Assessment/ Plan: ?GI bleed - hx of PUD and bleeding AVMs.  Enteroscopy 3/10 by GI showed non- bleeding AVM's. Flex sig showed fresh bleeding in prox sigmoid colon, colon full of blood. Plan is per GI/ pmd.  Keep Hb > 8 given sig CV disease.   ?ESRD- on HD MWF. Next HD Monday.  ?BP/ volume - cont BP medications, no vol excess, on RA, at dry wt  ?Anemia ckd - Hb 7.9, sp 1u prbc's here. Next esa due 3/22, cont IV Fe load. Transfuse prn per pmd.  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 Ca in range.  ? ? ? ? ?Rob Doctor, hospital ?07/06/2021, 1:19 PM ? ? ?Recent Labs  ?Lab 07/03/21 ?1700 07/03/21 ?2144 07/04/21 ?0143 07/04/21 ?0906 07/05/21 ?1729 07/06/21 ?1308  ?HGB 8.0*   < > 7.9*   < > 8.7* 7.9*  ?ALBUMIN 2.6*  --  2.5*  --   --   --   ?CALCIUM 8.8*  --  9.5  --   --   --   ?CREATININE 4.25*  --  5.13*  --   --   --   ?K 4.9  --  3.6  --   --   --   ? < > = values in this interval not displayed.  ? ? ?Inpatient medications: ? sodium chloride   Intravenous Once  ? amLODipine  10 mg Oral QHS  ? Chlorhexidine Gluconate Cloth  6 each Topical Q0600   ? cloNIDine  0.1 mg Oral Q M,W,F-2000  ? cloNIDine  0.1 mg Oral 2 times per day on Sun Tue Thu Sat  ? gabapentin  100 mg Oral TID  ? hydrocortisone cream   Topical BID  ? lidocaine  1 patch Transdermal Q24H  ? nicotine  14 mg Transdermal Daily  ? pantoprazole  40 mg Oral BID  ? ? ferric gluconate (FERRLECIT) IVPB 125 mg (07/05/21 1737)  ? ?acetaminophen **OR** acetaminophen, hydrOXYzine, oxyCODONE-acetaminophen ? ? ? ? ? ? ?

## 2021-07-06 NOTE — Progress Notes (Signed)
PROGRESS NOTE    Daniel [name]  VFI:[id number] DOB: [birth year] DOA: 07/03/2021 PCP: [name], NP    Brief Narrative:  61 year old with history of ESRD on hemodialysis Monday Wednesday Friday, anemia of chronic disease with baseline hemoglobin 7-8, multiple AV malformations GI tract, recent left below-knee amputation and currently at a skilled nursing facility brought to emergency room from dialysis center after he had a large volume fresh rectal bleed after completing dialysis on 3/8. Patient was mainly complaining of right-sided chest pain, lot of pain on his left below-knee amputation stump and also on the right leg.  Denied any nausea vomiting or abdominal pain or distention. Seen by vascular surgery on arrival.  CT angiogram lower extremities without evidence of acute blockages.   Assessment & Plan:   Acute lower GI bleeding, acute on chronic anemia of blood loss: Acute blood loss anemia. Known previous AVMs. Patient had multiple fresh and maroon-colored stool . Hemoglobin down to 7-1 unit PRBC transfusion with appropriate response.  Hemoglobin 7.9 today.  Continue monitoring every 12 hours.  Currently hemodynamically stable. Upper GI endoscopy with nonbleeding ulcer. Underwent flex sig, found fresh bleeding around the proximal sigmoid colon.  Full colonoscopy not done. Remained stable without further bleeding, CT angiogram was not done. CT angiogram for the lower extremities also showed pooling of blood in the rectal area. Patient is not on any antiplatelets or antithrombotics. Dialysis patient with significant cardiovascular comorbidities, will keep hemoglobin more than 8.  Getting IV iron today.  -We will need to monitor for any further bleeding before transition into a skilled nursing facility.  Right-sided chest pain: Complaint of severe pleuritic chest pain, CT angiogram of the chest negative for pulmonary embolism.  Musculoskeletal pain.  Lidocaine patch and  Percocet.  Improving.  Severe peripheral vascular disease status post left below-knee amputation: Seen by vascular surgery.  Symptomatic treatment suggested.  CT angiogram without any evidence of acute arterial compromise.  Vascular surgery to schedule outpatient follow-up.  Essential hypertension: Blood pressure is stable.  Home medications resumed.  ESRD on hemodialysis: Getting hemodialysis on a schedule.  Clinically stabilizing.  Need to monitor for further episode of bleeding before transitioning to a skilled nursing rehab.   DVT prophylaxis: SCDs Start: 07/03/21 2110   Code Status: Full code Family Communication: None Disposition Plan: Status is: Inpatient.  Patient remains inpatient , significant rectal bleeding.  Need to be stabilized before transferring to SNF.   Consultants:  Gastroenterology,  Nephrology,   Procedures:  None  Antimicrobials:  None   Subjective:  Patient seen and examined.  Denies any nausea vomiting.  No other overnight events.  He has not have any bowel movement since sigmoidoscopy yesterday.  His main concern is numbness and pain on his right foot, discussed with vascular surgery team at the bedside and they are following.  Apparently, he has not been walking with a walker at a skilled rehab for last 2 months.  He worked with physical therapy here and was able to use walker to get around.  Chest pain is still present but improved and controlled with Percocet.  Objective: Vitals:   07/05/21 2324 07/06/21 0438 07/06/21 0450 07/06/21 0750  BP: (!) 144/80 (!) 148/92  140/78  Pulse: 95 84  84  Resp: '18 18  18  '$ Temp: 98.2 F (36.8 C) 97.8 F (36.6 C)  97.8 F (36.6 C)  TempSrc: Oral Oral  Oral  SpO2: 99% 97%  97%  Weight:   117 kg  Height:        Intake/Output Summary (Last 24 hours) at 07/06/2021 1030 Last data filed at 07/06/2021 0409 Gross per 24 hour  Intake 740 ml  Output 2100 ml  Net -1360 ml   Filed Weights   07/05/21 0738  07/05/21 1042 07/06/21 0450  Weight: 118.2 kg 117 kg 117 kg    Examination:  General: Looks fairly comfortable.  On room air.  Sitting in chair when working with physical therapy. Cardiovascular: S1-S2 normal.  Regular rate rhythm. Respiratory: Bilateral clear.  No added sounds.  Reproducible tenderness improved than before on the right side of the chest. Gastrointestinal: Soft.  Nontender.  Bowel sound present. Ext: Right leg with chronic ischemic changes.  Doppler pulses present. Left below-knee amputation the stump with some dehiscence, clean and dry. Neuro: Intact. Musculoskeletal: No deformities. Right AV fistula.    Data Reviewed: I have personally reviewed following labs and imaging studies  CBC: Recent Labs  Lab 07/03/21 1700 07/03/21 2144 07/04/21 0143 07/04/21 0906 07/04/21 1642 07/04/21 1815 07/05/21 0507 07/05/21 1729 07/06/21 0551  WBC 4.4  --  5.5  --   --   --   --   --   --   NEUTROABS 2.2  --  2.6  --   --   --   --   --   --   HGB 8.0*   < > 7.9*   < > 7.6* 7.3* 7.0* 8.7* 7.9*  HCT 25.3*   < > 24.0*   < > 22.6* 23.2* 21.4* 26.0* 24.6*  MCV 108.1*  --  104.8*  --   --   --   --   --   --   PLT 173  --  161  --   --   --   --   --   --    < > = values in this interval not displayed.   Basic Metabolic Panel: Recent Labs  Lab 07/03/21 1700 07/04/21 0143  NA 140 141  K 4.9 3.6  CL 95* 95*  CO2 27 29  GLUCOSE 94 86  BUN 11 12  CREATININE 4.25* 5.13*  CALCIUM 8.8* 9.5  MG  --  2.0   GFR: Estimated Creatinine Clearance: 19.7 mL/min (A) (by C-G formula based on SCr of 5.13 mg/dL (H)). Liver Function Tests: Recent Labs  Lab 07/03/21 1700 07/04/21 0143  AST 82* 51*  ALT 16 16  ALKPHOS 64 59  BILITOT 2.0* 0.3  PROT 7.7 8.1  ALBUMIN 2.6* 2.5*   No results for input(s): LIPASE, AMYLASE in the last 168 hours. No results for input(s): AMMONIA in the last 168 hours. Coagulation Profile: Recent Labs  Lab 07/03/21 1700 07/04/21 0554  INR 1.1  1.1   Cardiac Enzymes: No results for input(s): CKTOTAL, CKMB, CKMBINDEX, TROPONINI in the last 168 hours. BNP (last 3 results) No results for input(s): PROBNP in the last 8760 hours. HbA1C: No results for input(s): HGBA1C in the last 72 hours. CBG: Recent Labs  Lab 07/05/21 1309  GLUCAP 79   Lipid Profile: No results for input(s): CHOL, HDL, LDLCALC, TRIG, CHOLHDL, LDLDIRECT in the last 72 hours. Thyroid Function Tests: No results for input(s): TSH, T4TOTAL, FREET4, T3FREE, THYROIDAB in the last 72 hours. Anemia Panel: Recent Labs    07/04/21 0554  FOLATE 11.9  FERRITIN 218  TIBC 361  IRON 159  RETICCTPCT 5.5*   Sepsis Labs: No results for input(s): PROCALCITON, LATICACIDVEN in the last 168 hours.  Recent Results (from the past 240 hour(s))  Resp Panel by RT-PCR (Flu A&B, Covid) Nasopharyngeal Swab     Status: None   Collection Time: 07/03/21  9:07 PM   Specimen: Nasopharyngeal Swab; Nasopharyngeal(NP) swabs in vial transport medium  Result Value Ref Range Status   SARS Coronavirus 2 by RT PCR NEGATIVE NEGATIVE Final    Comment: (NOTE) SARS-CoV-2 target nucleic acids are NOT DETECTED.  The SARS-CoV-2 RNA is generally detectable in upper respiratory specimens during the acute phase of infection. The lowest concentration of SARS-CoV-2 viral copies this assay can detect is 138 copies/mL. A negative result does not preclude SARS-Cov-2 infection and should not be used as the sole basis for treatment or other patient management decisions. A negative result may occur with  improper specimen collection/handling, submission of specimen other than nasopharyngeal swab, presence of viral mutation(s) within the areas targeted by this assay, and inadequate number of viral copies(<138 copies/mL). A negative result must be combined with clinical observations, patient history, and epidemiological information. The expected result is Negative.  Fact Sheet for Patients:   EntrepreneurPulse.com.au  Fact Sheet for Healthcare Providers:  IncredibleEmployment.be  This test is no t yet approved or cleared by the [place] FDA and  has been authorized for detection and/or diagnosis of SARS-CoV-2 by FDA under an Emergency Use Authorization (EUA). This EUA will remain  in effect (meaning this test can be used) for the duration of the COVID-19 declaration under Section 564(b)(1) of the Act, 21 U.S.C.section 360bbb-3(b)(1), unless the authorization is terminated  or revoked sooner.       Influenza A by PCR NEGATIVE NEGATIVE Final   Influenza B by PCR NEGATIVE NEGATIVE Final    Comment: (NOTE) The Xpert Xpress SARS-CoV-2/FLU/RSV plus assay is intended as an aid in the diagnosis of influenza from Nasopharyngeal swab specimens and should not be used as a sole basis for treatment. Nasal washings and aspirates are unacceptable for Xpert Xpress SARS-CoV-2/FLU/RSV testing.  Fact Sheet for Patients: EntrepreneurPulse.com.au  Fact Sheet for Healthcare Providers: IncredibleEmployment.be  This test is not yet approved or cleared by the [place] FDA and has been authorized for detection and/or diagnosis of SARS-CoV-2 by FDA under an Emergency Use Authorization (EUA). This EUA will remain in effect (meaning this test can be used) for the duration of the COVID-19 declaration under Section 564(b)(1) of the Act, 21 U.S.C. section 360bbb-3(b)(1), unless the authorization is terminated or revoked.  Performed at [facility], [place] [name], [place], [place] [address]          Radiology Studies: No results found.      Scheduled Meds:  sodium chloride   Intravenous Once   amLODipine  10 mg Oral QHS   Chlorhexidine Gluconate Cloth  6 each Topical Q0600   cloNIDine  0.1 mg Oral Q M,W,F-2000   cloNIDine  0.1 mg Oral 2 times per day on Sun Tue Thu Sat   gabapentin  100 mg  Oral TID   hydrocortisone cream   Topical BID   lidocaine  1 patch Transdermal Q24H   nicotine  14 mg Transdermal Daily   pantoprazole  40 mg Oral BID   Continuous Infusions:  ferric gluconate (FERRLECIT) IVPB 125 mg (07/05/21 1737)     LOS: 2 days    Time spent: 35 minutes    Barb Merino, MD [facility] Pager [phone]

## 2021-07-06 NOTE — Evaluation (Signed)
Physical Therapy Evaluation ?Patient Details ?Name: Daniel [name] ?MRN: [id number] ?DOB: 05-24-1960 ?Today's Date: 07/06/2021 ? ?History of Present Illness ? Pt is a 61 y/o male presenting on 3/8 from SNF for rectal bleeding, R sided chest pain. Found with acute GI bleed. PMH includes: anemia, DM, ESRD on HD (MWF), HTN, ICD, PAD s/p L BKA.  ?Clinical Impression ? Pt presents to PT with continued decreased mobility since lt BKA in 03/2022. Recommend return to SNF for further rehab with emphasis on gait training for hopeful eventual ability to use a prosthesis.    ?   ? ?Recommendations for follow up therapy are one component of a multi-disciplinary discharge planning process, led by the attending physician.  Recommendations may be updated based on patient status, additional functional criteria and insurance authorization. ? ?Follow Up Recommendations Skilled nursing-short term rehab (<3 hours/day) ? ?  ?Assistance Recommended at Discharge Intermittent Supervision/Assistance  ?Patient can return home with the following ? A little help with walking and/or transfers;Help with stairs or ramp for entrance ? ?  ?Equipment Recommendations None recommended by PT  ?Recommendations for Other Services ?    ?  ?Functional Status Assessment Patient has had a recent decline in their functional status and demonstrates the ability to make significant improvements in function in a reasonable and predictable amount of time.  ? ?  ?Precautions / Restrictions Precautions ?Precautions: Fall ?Precaution Comments: recent L BKA (non healing) ?Restrictions ?Weight Bearing Restrictions: No  ? ?  ? ?Mobility ? Bed Mobility ?Overal bed mobility: Modified Independent ?  ?  ?  ?  ?  ?  ?  ?  ? ?Transfers ?Overall transfer level: Needs assistance ?Equipment used: None, Rolling walker (2 wheels) ?Transfers: Sit to/from Stand, Bed to chair/wheelchair/BSC ?Sit to Stand: Min assist ?  ?  ?Squat pivot transfers: Min guard ?  ?  ?General transfer  comment: Bed to chair with squat pivot with min guard for safety. Sit to stand from recliner with walker with min assist for stability when rising ?  ? ?Ambulation/Gait ?Ambulation/Gait assistance: Min assist ?Gait Distance (Feet): 15 Feet ?Assistive device: Rolling walker (2 wheels) ?Gait Pattern/deviations: Step-to pattern (hop to) ?Gait velocity: decr ?Gait velocity interpretation: <1.31 ft/sec, indicative of household ambulator ?  ?General Gait Details: Assist for balance and support ? ?Stairs ?  ?  ?  ?  ?  ? ?Wheelchair Mobility ?  ? ?Modified Rankin (Stroke Patients Only) ?  ? ?  ? ?Balance Overall balance assessment: Needs assistance ?Sitting-balance support: No upper extremity supported, Feet supported ?Sitting balance-Leahy Scale: Good ?  ?  ?Standing balance support: Bilateral upper extremity supported, During functional activity ?Standing balance-Leahy Scale: Poor ?Standing balance comment: Walker and min guard for static standing ?  ?  ?  ?  ?  ?  ?  ?  ?  ?  ?  ?   ? ? ? ?Pertinent Vitals/Pain Pain Assessment ?Pain Assessment: 0-10 ?Pain Score: 8  ?Pain Location: L BKA ?Pain Descriptors / Indicators: Discomfort ?Pain Intervention(s): Limited activity within patient's tolerance, Repositioned  ? ? ?Home Living Family/patient expects to be discharged to:: Skilled nursing facility ?  ?  ?  ?  ?  ?  ?  ?  ?  ?   ?  ?Prior Function Prior Level of Function : Needs assist ?  ?  ?  ?Physical Assist : Mobility (physical) ?Mobility (physical): Gait ?  ?Mobility Comments: reports transfering without assist.  Reported has not been  amb at SNF ?ADLs Comments: reports independent ADLs from wc level at SNF (basin bathing in bathroom) ?  ? ? ?Hand Dominance  ? Dominant Hand: Right ? ?  ?Extremity/Trunk Assessment  ? Upper Extremity Assessment ?Upper Extremity Assessment: Defer to OT evaluation ?  ? ?Lower Extremity Assessment ?Lower Extremity Assessment: LLE deficits/detail;Generalized weakness ?LLE Deficits / Details:  BKA; Knee ROM 0-90 ?  ? ?   ?Communication  ? Communication: No difficulties  ?Cognition Arousal/Alertness: Awake/alert ?Behavior During Therapy: [facility] for tasks assessed/performed ?Overall Cognitive Status: Within Functional Limits for tasks assessed ?  ?  ?  ?  ?  ?  ?  ?  ?  ?  ?  ?  ?  ?  ?  ?  ?  ?  ?  ? ?  ?General Comments General comments (skin integrity, edema, etc.): educated on importance of L knee extension, educated on BKA precautions. Pt reports spending most of his time in the w/c at SNF and keeps L BKA bent. ? ?  ?Exercises    ? ?Assessment/Plan  ?  ?PT Assessment Patient needs continued PT services  ?PT Problem List Decreased strength;Decreased balance;Decreased mobility;Pain ? ?   ?  ?PT Treatment Interventions DME instruction;Gait training;Functional mobility training;Therapeutic activities;Therapeutic exercise;Balance training;Patient/family education   ? ?PT Goals (Current goals can be found in the Care Plan section)  ?Acute Rehab PT Goals ?Patient Stated Goal: get leg to heal ?PT Goal Formulation: With patient ?Time For Goal Achievement: 07/20/21 ?Potential to Achieve Goals: Good ? ?  ?Frequency Min 2X/week ?  ? ? ?Co-evaluation   ?  ?  ?  ?  ? ? ?  ?AM-PAC PT "6 Clicks" Mobility  ?Outcome Measure Help needed turning from your back to your side while in a flat bed without using bedrails?: None ?Help needed moving from lying on your back to sitting on the side of a flat bed without using bedrails?: None ?Help needed moving to and from a bed to a chair (including a wheelchair)?: A Little ?Help needed standing up from a chair using your arms (e.g., wheelchair or bedside chair)?: A Little ?Help needed to walk in hospital room?: Total ?Help needed climbing 3-5 steps with a railing? : Total ?6 Click Score: 16 ? ?  ?End of Session Equipment Utilized During Treatment: Gait belt ?Activity Tolerance: Patient tolerated treatment well ?Patient left: in chair;with call bell/phone within reach;with chair alarm  set ?Nurse Communication: Mobility status ?PT Visit Diagnosis: Other abnormalities of gait and mobility (R26.89) ?  ? ?Time: 9833-8250 ?PT Time Calculation (min) (ACUTE ONLY): 18 min ? ? ?Charges:   PT Evaluation ?$PT Eval Moderate Complexity: 1 Mod ?  ?  ?   ? ? ?[facility] PT ?Acute Rehabilitation Services ?Pager [phone] ?Office [phone] ? ? ?Shary Decamp [facility] ?07/06/2021, 12:07 PM ? ?

## 2021-07-06 NOTE — Plan of Care (Signed)
?  Problem: Health Behavior/Discharge Planning: ?Goal: Ability to manage health-related needs will improve ?Outcome: Progressing ?  ?Problem: Clinical Measurements: ?Goal: Will remain free from infection ?Outcome: Progressing ?Goal: Diagnostic test results will improve ?Outcome: Progressing ?Goal: Cardiovascular complication will be avoided ?Outcome: Progressing ?  ?Problem: Activity: ?Goal: Risk for activity intolerance will decrease ?Outcome: Progressing ?  ?

## 2021-07-06 NOTE — NC FL2 (Signed)
?[place] MEDICAID FL2 LEVEL OF CARE SCREENING TOOL  ?  ? ?IDENTIFICATION  ?Patient Name: ?Daniel [name] Birthdate: 1961-04-08 Sex: male Admission Date (Current Location): ?07/03/2021  ?[place] and [place] Number: ? [place] ?  Facility and Address:  ?[facility], [place] [address], [place], [place] [address] ?     Provider Number: ?[id number]  ?Attending Physician Name and Address:  ?Barb Merino, MD ? Relative Name and Phone Number:  ?Harrington Challenger Brother [phone] ?   ?Current Level of Care: ?Hospital Recommended Level of Care: ?[place] Prior Approval Number: ?  ? ?Date Approved/Denied: ?  PASRR Number: ?[id number] A ? ?Discharge Plan: ?SNF ?  ? ?Current Diagnoses: ?Patient Active Problem List  ? Diagnosis Date Noted  ? Pressure injury of skin 07/05/2021  ? Right leg pain 07/04/2021  ? GI bleeding 07/04/2021  ? Acute lower GI bleeding 07/03/2021  ? Acute on chronic diastolic CHF (congestive heart failure) ([place]) 06/14/2021  ? GERD (gastroesophageal reflux disease) 06/14/2021  ? Chest pain 06/13/2021  ? Diabetic foot infection ([place]) 04/14/2021  ? PAD (peripheral artery disease) ([place]) 04/08/2021  ? Gastritis and gastroduodenitis   ? End-stage renal disease on hemodialysis ([place]) 03/25/2021  ? Diabetes mellitus type 2, controlled, with complications ([place]) 41/28/7867  ? QT prolongation 03/25/2021  ? Alcohol abuse with intoxication ([place]) 03/25/2021  ? Cocaine abuse ([place]) 03/25/2021  ? Tobacco use 03/25/2021  ? Class 2 obesity due to excess calories with body mass index (BMI) of 39.0 to 39.9 in adult 03/25/2021  ? AVM (arteriovenous malformation) of duodenum, acquired   ? Peptic ulcer disease   ? Upper GI bleed 03/24/2021  ? Chalazion of right upper eyelid 03/24/2021  ? Thrombocytopenia ([place]) 03/24/2021  ? Visual hallucination 03/24/2021  ? Bilateral leg edema 03/24/2021  ? Acute blood loss anemia   ? Benign neoplasm of duodenum, jejunum, and ileum   ? Anemia due to  chronic kidney disease 02/23/2021  ? Prolonged QT interval 02/23/2021  ? Rotator cuff tear arthropathy of right shoulder 01/23/2021  ? Alcohol dependence ([place]) 01/13/2021  ? Gastric ulcer with hemorrhage 01/13/2021  ? Generalized weakness   ? Transaminitis 01/10/2021  ? History of alcohol abuse 10/27/2020  ? History of cocaine abuse ([place]) 10/27/2020  ? Nicotine dependence, cigarettes, uncomplicated 67/20/9470  ? Acute GI bleeding 08/10/2020  ? Gastrointestinal hemorrhage with melena   ? Duodenal ulcer with hemorrhage   ? 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 ESRD on hemodialysis ([place])   ? AVM (arteriovenous malformation) of small bowel, acquired with hemorrhage   ? Symptomatic anemia 07/31/2019  ? Acute on chronic anemia 07/23/2019  ? Chronic hepatitis C without hepatic coma ([place]) 07/11/2019  ? Iron deficiency anemia 03/30/2019  ? Alcohol use disorder 03/30/2019  ? B12 deficiency 03/30/2019  ? Orthostatic hypotension 01/22/2019  ? Essential hypertension 06/29/2016  ? DM2 (diabetes mellitus, type 2) ([place]) 06/29/2016  ? ? ?Orientation RESPIRATION BLADDER Height & Weight   ?  ?Self, Time, Situation, Place ? Normal Continent Weight: 257 lb 15 oz (117 kg) ?Height:  '5\' 11"'$  (180.3 cm)  ?BEHAVIORAL SYMPTOMS/MOOD NEUROLOGICAL BOWEL NUTRITION STATUS  ?    Continent Diet (see DC summary)  ?AMBULATORY STATUS COMMUNICATION OF NEEDS Skin   ?Limited Assist Verbally Surgical wounds, PU Stage and Appropriate Care (Pressure Injury 07/04/21 Buttocks Medial Stage 2 -  Partial thickness loss of dermis presenting as a shallow open injury with a red, pink wound bed without  slough. two small areas of broken skin in between buttocks.) ?  ?PU Stage 2 Dressing:  (PRN) ?  ?    ?     ?     ? ? ?Personal Care Assistance Level of Assistance  ?Bathing, Feeding, Dressing Bathing Assistance: Limited assistance ?Feeding assistance: Limited assistance ?Dressing Assistance: Limited assistance ?   ? ?Functional Limitations Info  ?Sight,  Hearing Sight Info: Adequate ?Hearing Info: Adequate ?   ? ? ?SPECIAL CARE FACTORS FREQUENCY  ?PT (By licensed PT), OT (By licensed OT)   ?  ?PT Frequency: 5x per week ?OT Frequency: 5x per week ?  ?  ?  ?   ? ? ?Contractures Contractures Info: Not present  ? ? ?Additional Factors Info  ?Code Status, Allergies   ?  ?  ?  ?  ?   ? ?Current Medications (07/06/2021):  This is the current hospital active medication list ?Current Facility-Administered Medications  ?Medication Dose Route Frequency Provider Last Rate Last Admin  ? 0.9 %  sodium chloride infusion (Manually program via Guardrails IV Fluids)   Intravenous Once Carol Ada, MD   Stopped at 07/05/21 1129  ? acetaminophen (TYLENOL) tablet 650 mg  650 mg Oral Q6H PRN Carol Ada, MD   650 mg at 07/06/21 4580  ? Or  ? acetaminophen (TYLENOL) suppository 650 mg  650 mg Rectal Q6H PRN Carol Ada, MD      ? amLODipine (NORVASC) tablet 10 mg  10 mg Oral QHS Carol Ada, MD   10 mg at 07/05/21 2132  ? Chlorhexidine Gluconate Cloth 2 % PADS 6 each  6 each Topical Q0600 Carol Ada, MD   6 each at 07/06/21 0502  ? cloNIDine (CATAPRES) tablet 0.1 mg  0.1 mg Oral Q M,W,F-2000 Carol Ada, MD   0.1 mg at 07/05/21 2131  ? cloNIDine (CATAPRES) tablet 0.1 mg  0.1 mg Oral 2 times per day on Sun Tue Thu Sat Carol Ada, MD   0.1 mg at 07/06/21 9983  ? ferric gluconate (FERRLECIT) 125 mg in sodium chloride 0.9 % 100 mL IVPB  125 mg Intravenous Q M,W,F-1800 Carol Ada, MD 110 mL/hr at 07/05/21 1737 125 mg at 07/05/21 1737  ? gabapentin (NEURONTIN) capsule 100 mg  100 mg Oral TID Carol Ada, MD   100 mg at 07/06/21 3825  ? hydrocortisone cream 1 %   Topical BID Carol Ada, MD   Given at 07/06/21 0815  ? hydrOXYzine (ATARAX) tablet 25 mg  25 mg Oral Q6H PRN Carol Ada, MD      ? lidocaine (LIDODERM) 5 % 1 patch  1 patch Transdermal Q24H Carol Ada, MD   1 patch at 07/05/21 1724  ? nicotine (NICODERM CQ - dosed in mg/24 hours) patch 14 mg  14 mg  Transdermal Daily Carol Ada, MD   14 mg at 07/06/21 0539  ? oxyCODONE-acetaminophen (PERCOCET/ROXICET) 5-325 MG per tablet 2 tablet  2 tablet Oral Q6H PRN Barb Merino, MD      ? pantoprazole (PROTONIX) EC tablet 40 mg  40 mg Oral BID Carol Ada, MD   40 mg at 07/06/21 7673  ? ? ? ?Discharge Medications: ?Please see discharge summary for a list of discharge medications. ? ?Relevant Imaging Results: ? ?Relevant Lab Results: ? ? ?Additional Information ?SSN# [id number] Pt on HD MWF ? ?Bary Castilla, LCSW ? ? ? ? ?

## 2021-07-06 NOTE — Progress Notes (Addendum)
?  Progress Note ? ? ? ?07/06/2021 ?8:58 AM ?1 Day Post-Op ? ?Subjective:  sitting up eating breakfast.  ? ?Tm 99.5 now afebrile ? ?Vitals:  ? 07/06/21 0438 07/06/21 0750  ?BP: (!) 148/92 140/78  ?Pulse: 84 84  ?Resp: 18 18  ?Temp: 97.8 ?F (36.6 ?C) 97.8 ?F (36.6 ?C)  ?SpO2: 97% 97%  ? ? ?Physical Exam: ?Incisions:  not examined  ? ? ?CBC ?   ?Component Value Date/Time  ? WBC 5.5 07/04/2021 0143  ? RBC 2.15 (L) 07/04/2021 0554  ? RBC 2.29 (L) 07/04/2021 0143  ? HGB 7.9 (L) 07/06/2021 0551  ? HGB 8.4 (L) 08/22/2020 1141  ? HCT 24.6 (L) 07/06/2021 0551  ? HCT 25.0 (L) 08/22/2020 1141  ? PLT 161 07/04/2021 0143  ? PLT 184 08/22/2020 1141  ? MCV 104.8 (H) 07/04/2021 0143  ? MCV 94 08/22/2020 1141  ? MCH 34.5 (H) 07/04/2021 0143  ? MCHC 32.9 07/04/2021 0143  ? RDW 17.5 (H) 07/04/2021 0143  ? RDW 16.7 (H) 08/22/2020 1141  ? LYMPHSABS 1.9 07/04/2021 0143  ? LYMPHSABS 1.8 08/22/2020 1141  ? MONOABS 0.7 07/04/2021 0143  ? EOSABS 0.2 07/04/2021 0143  ? EOSABS 0.3 08/22/2020 1141  ? BASOSABS 0.0 07/04/2021 0143  ? BASOSABS 0.0 08/22/2020 1141  ? ? ?BMET ?   ?Component Value Date/Time  ? NA 141 07/04/2021 0143  ? NA 138 08/22/2020 1141  ? K 3.6 07/04/2021 0143  ? CL 95 (L) 07/04/2021 0143  ? CO2 29 07/04/2021 0143  ? GLUCOSE 86 07/04/2021 0143  ? BUN 12 07/04/2021 0143  ? BUN 20 08/22/2020 1141  ? CREATININE 5.13 (H) 07/04/2021 0143  ? CALCIUM 9.5 07/04/2021 0143  ? CALCIUM 7.9 (L) 08/01/2019 8341  ? GFRNONAA 12 (L) 07/04/2021 0143  ? GFRAA 7 (L) 12/28/2019 1640  ? ? ?INR ?   ?Component Value Date/Time  ? INR 1.1 07/04/2021 0554  ? ? ? ?Intake/Output Summary (Last 24 hours) at 07/06/2021 0858 ?Last data filed at 07/06/2021 0409 ?Gross per 24 hour  ?Intake 1710 ml  ?Output 2100 ml  ?Net -390 ml  ? ? ? ?Assessment/Plan:  61 y.o. male is s/p left below knee amputation 04/16/2021 by Dr. Scot Dock and subsequent left BKA washout and debridement on 06/17/2021 by Dr. Virl Cagey ? ?-went to change dressing for pt this morning and bandage was  changed earlier. Will change dressing tomorrow.  ?-underwent flexible sigmoidoscopy yesterday for GIB-blood in rectum and rectosigmoid colon-recommend following hgb and transfusing as necessary. ?-planning for CIR today if pt has BM without bleeding ? ? ?Daniel Locket, Daniel [name] ?[facility] ?[phone] ?07/06/2021 ?8:58 AM ? ? ? ?I have interviewed and examined patient with PA and agree with assessment and plan above.  Okay for CIR.  Right lower extremity pain appears chronic and given current GI bleed will delay any intervention.  I have ordered ABIs to be performed while he is here.  I will notify Dr. Virl Cagey of his admission. ? ?Janathan Bribiesca C. Donzetta Matters, MD ?[facility] ?Office: [phone] ?Pager: [phone] ? ? ? ? ? ?  ? ?

## 2021-07-06 NOTE — Evaluation (Signed)
Occupational Therapy Evaluation ?Patient Details ?Name: Daniel [name] ?MRN: [id number] ?DOB: Nov 27, 1960 ?Today's Date: 07/06/2021 ? ? ?History of Present Illness Pt is a 61 y/o male presenting on 3/8 from SNF for rectal bleeding, R sided chest pain. Found with acute GI bleed. PMH includes: anemia, DM, ESRD on HD (MWF), HTN, ICD, PAD s/p L BKA.  ? ?Clinical Impression ?  ?Patient admitted from SNF, presenting with impaired balance, decreased activity tolerance, pain.  Recent L BKA, was in rehab from this surgery.  He reports transferring without assist, completing ADLS with independence, but needing assist for mobility at SNF.  He currently requires min assist for transfers using RW, up to min assist for ADLs. Discussed precautions to L BKA, recommendations to keep knee straight and elevate LE when OOB.  Patient will benefit from further OT services while admitted and recommend return to SNF to optimize independence and safety with ADLs and mobility.  ?   ? ?Recommendations for follow up therapy are one component of a multi-disciplinary discharge planning process, led by the attending physician.  Recommendations may be updated based on patient status, additional functional criteria and insurance authorization.  ? ?Follow Up Recommendations ? Skilled nursing-short term rehab (<3 hours/day) (return to SNF)  ?  ?Assistance Recommended at Discharge Frequent or constant Supervision/Assistance  ?Patient can return home with the following A little help with walking and/or transfers;A little help with bathing/dressing/bathroom;Assistance with cooking/housework;Help with stairs or ramp for entrance;Assist for transportation ? ?  ?Functional Status Assessment ? Patient has had a recent decline in their functional status and demonstrates the ability to make significant improvements in function in a reasonable and predictable amount of time.  ?Equipment Recommendations ? Other (comment) (defer)  ?  ?Recommendations for Other  Services   ? ? ?  ?Precautions / Restrictions Precautions ?Precautions: Fall ?Precaution Comments: recent L BKA (non healing) ?Restrictions ?Weight Bearing Restrictions: No  ? ?  ? ?Mobility Bed Mobility ?Overal bed mobility: Modified Independent ?  ?  ?  ?  ?  ?  ?  ?  ? ?Transfers ?  ?  ?  ?  ?  ?  ?  ?  ?  ?  ?  ? ?  ?Balance Overall balance assessment: Needs assistance ?Sitting-balance support: No upper extremity supported, Feet supported ?Sitting balance-Leahy Scale: Good ?  ?  ?Standing balance support: Bilateral upper extremity supported, Single extremity supported, During functional activity ?Standing balance-Leahy Scale: Fair ?Standing balance comment: relies on UE support ?  ?  ?  ?  ?  ?  ?  ?  ?  ?  ?  ?   ? ?ADL either performed or assessed with clinical judgement  ? ?ADL Overall ADL's : Needs assistance/impaired ?  ?  ?Grooming: Set up;Sitting ?  ?  ?  ?  ?  ?Upper Body Dressing : Set up;Sitting ?  ?Lower Body Dressing: Min guard;Sit to/from stand ?  ?Toilet Transfer: Minimal assistance;BSC/3in1;Stand-pivot ?Toilet Transfer Details (indicate cue type and reason): using RW ?Toileting- Clothing Manipulation and Hygiene: Sitting/lateral lean;Minimal assistance;Sit to/from stand ?  ?  ?  ?Functional mobility during ADLs: Minimal assistance;Rolling walker (2 wheels);Cueing for safety ?General ADL Comments: pt limited by pain, balance  ? ? ? ?Vision   ?Vision Assessment?: No apparent visual deficits  ?   ?Perception   ?  ?Praxis   ?  ? ?Pertinent Vitals/Pain Pain Assessment ?Pain Assessment: 0-10 ?Pain Score: 8  ?Pain Location: L BKA ?Pain Descriptors /  Indicators: Discomfort ?Pain Intervention(s): Limited activity within patient's tolerance, Monitored during session, Repositioned  ? ? ? ?Hand Dominance Right ?  ?Extremity/Trunk Assessment Upper Extremity Assessment ?Upper Extremity Assessment: Overall WFL for tasks assessed ?  ?Lower Extremity Assessment ?Lower Extremity Assessment: Defer to PT evaluation ?   ?  ?  ?Communication Communication ?Communication: No difficulties ?  ?Cognition Arousal/Alertness: Awake/alert ?Behavior During Therapy: [facility] for tasks assessed/performed ?Overall Cognitive Status: Within Functional Limits for tasks assessed ?  ?  ?  ?  ?  ?  ?  ?  ?  ?  ?  ?  ?  ?  ?  ?  ?  ?  ?  ?General Comments  educated on importance of L knee extension, educated on BKA precautions. Pt reports spending most of his time in the w/c at SNF and keeps L BKA bent. ? ?  ?Exercises   ?  ?Shoulder Instructions    ? ? ?Home Living Family/patient expects to be discharged to:: Skilled nursing facility ?  ?  ?  ?  ?  ?  ?  ?  ?  ?  ?  ?  ?  ?  ?  ?  ?  ?  ? ?  ?Prior Functioning/Environment Prior Level of Function : Needs assist ?  ?  ?  ?Physical Assist : Mobility (physical) ?Mobility (physical): Gait ?  ?Mobility Comments: reports transfering without assist, but needing assist walking at SNF ?ADLs Comments: reports independent ADLs from wc level at SNF (basin bathing in bathroom) ?  ? ?  ?  ?OT Problem List: Decreased activity tolerance;Impaired balance (sitting and/or standing);Decreased safety awareness;Decreased knowledge of use of DME or AE;Decreased knowledge of precautions;Pain;Obesity ?  ?   ?OT Treatment/Interventions: Self-care/ADL training;DME and/or AE instruction;Energy conservation;Therapeutic exercise;Therapeutic activities;Patient/family education;Balance training  ?  ?OT Goals(Current goals can be found in the care plan section) Acute Rehab OT Goals ?Patient Stated Goal: be able to wa lk ?OT Goal Formulation: With patient ?Time For Goal Achievement: 07/20/21 ?Potential to Achieve Goals: Good  ?OT Frequency: Min 2X/week ?  ? ?Co-evaluation   ?  ?  ?  ?  ? ?  ?AM-PAC OT "6 Clicks" Daily Activity     ?Outcome Measure Help from another person eating meals?: None ?Help from another person taking care of personal grooming?: A Little ?Help from another person toileting, which includes using toliet, bedpan, or  urinal?: A Little ?Help from another person bathing (including washing, rinsing, drying)?: A Little ?Help from another person to put on and taking off regular upper body clothing?: A Little ?Help from another person to put on and taking off regular lower body clothing?: A Little ?6 Click Score: 19 ?  ?End of Session Equipment Utilized During Treatment: Other (comment) (defer) ?Nurse Communication: Mobility status;Patient requests pain meds ? ?Activity Tolerance: Patient tolerated treatment well ?Patient left: in bed;with call bell/phone within reach;with bed alarm set ? ?OT Visit Diagnosis: Other abnormalities of gait and mobility (R26.89);Muscle weakness (generalized) (M62.81);Pain ?Pain - Right/Left: Left ?Pain - part of body: Leg  ?              ?Time: 3007-6226 ?OT Time Calculation (min): 13 min ?Charges:  OT General Charges ?$OT Visit: 1 Visit ?OT Evaluation ?$OT Eval Moderate Complexity: 1 Mod ? ?Jolaine Artist, OT ?Acute Rehabilitation Services ?Pager [phone] ?Office [phone] ? ? ?Delight Stare ?07/06/2021, 11:34 AM ?

## 2021-07-07 ENCOUNTER — Inpatient Hospital Stay (HOSPITAL_COMMUNITY): Payer: Medicare Other

## 2021-07-07 DIAGNOSIS — I70223 Atherosclerosis of native arteries of extremities with rest pain, bilateral legs: Secondary | ICD-10-CM | POA: Diagnosis not present

## 2021-07-07 DIAGNOSIS — K922 Gastrointestinal hemorrhage, unspecified: Secondary | ICD-10-CM | POA: Diagnosis not present

## 2021-07-07 DIAGNOSIS — D500 Iron deficiency anemia secondary to blood loss (chronic): Secondary | ICD-10-CM | POA: Diagnosis not present

## 2021-07-07 LAB — HEMOGLOBIN AND HEMATOCRIT, BLOOD
HCT: 26.8 % — ABNORMAL LOW (ref 39.0–52.0)
Hemoglobin: 8.5 g/dL — ABNORMAL LOW (ref 13.0–17.0)

## 2021-07-07 MED ORDER — POLYETHYLENE GLYCOL 3350 17 G PO PACK
17.0000 g | PACK | Freq: Every day | ORAL | Status: DC
  Administered 2021-07-07 – 2021-07-08 (×2): 17 g via ORAL
  Filled 2021-07-07 (×2): qty 1

## 2021-07-07 MED ORDER — OXYCODONE-ACETAMINOPHEN 5-325 MG PO TABS
2.0000 | ORAL_TABLET | ORAL | Status: DC | PRN
  Administered 2021-07-07 – 2021-07-10 (×14): 2 via ORAL
  Filled 2021-07-07 (×14): qty 2

## 2021-07-07 NOTE — Progress Notes (Addendum)
?  Progress Note ? ? ? ?07/07/2021 ?9:01 AM ?2 Days Post-Op ? ?Subjective:  still having right leg pain ? ?afebrile ? ?Vitals:  ? 07/06/21 2349 07/07/21 0430  ?BP: 137/74 (!) 144/85  ?Pulse: 75 84  ?Resp: 20 20  ?Temp: (!) 97.5 ?F (36.4 ?C) 97.6 ?F (36.4 ?C)  ?SpO2:  96%  ? ? ?Physical Exam: ?General:  no distress ?Lungs:  non labored ?Incisions:   ? ? ? ? ?CBC ?   ?Component Value Date/Time  ? WBC 5.5 07/04/2021 0143  ? RBC 2.15 (L) 07/04/2021 0554  ? RBC 2.29 (L) 07/04/2021 0143  ? HGB 8.5 (L) 07/07/2021 0432  ? HGB 8.4 (L) 08/22/2020 1141  ? HCT 26.8 (L) 07/07/2021 0432  ? HCT 25.0 (L) 08/22/2020 1141  ? PLT 161 07/04/2021 0143  ? PLT 184 08/22/2020 1141  ? MCV 104.8 (H) 07/04/2021 0143  ? MCV 94 08/22/2020 1141  ? MCH 34.5 (H) 07/04/2021 0143  ? MCHC 32.9 07/04/2021 0143  ? RDW 17.5 (H) 07/04/2021 0143  ? RDW 16.7 (H) 08/22/2020 1141  ? LYMPHSABS 1.9 07/04/2021 0143  ? LYMPHSABS 1.8 08/22/2020 1141  ? MONOABS 0.7 07/04/2021 0143  ? EOSABS 0.2 07/04/2021 0143  ? EOSABS 0.3 08/22/2020 1141  ? BASOSABS 0.0 07/04/2021 0143  ? BASOSABS 0.0 08/22/2020 1141  ? ? ?BMET ?   ?Component Value Date/Time  ? NA 141 07/04/2021 0143  ? NA 138 08/22/2020 1141  ? K 3.6 07/04/2021 0143  ? CL 95 (L) 07/04/2021 0143  ? CO2 29 07/04/2021 0143  ? GLUCOSE 86 07/04/2021 0143  ? BUN 12 07/04/2021 0143  ? BUN 20 08/22/2020 1141  ? CREATININE 5.13 (H) 07/04/2021 0143  ? CALCIUM 9.5 07/04/2021 0143  ? CALCIUM 7.9 (L) 08/01/2019 9833  ? GFRNONAA 12 (L) 07/04/2021 0143  ? GFRAA 7 (L) 12/28/2019 1640  ? ? ?INR ?   ?Component Value Date/Time  ? INR 1.1 07/04/2021 0554  ? ? ? ?Intake/Output Summary (Last 24 hours) at 07/07/2021 0901 ?Last data filed at 07/07/2021 0600 ?Gross per 24 hour  ?Intake 240 ml  ?Output 450 ml  ?Net -210 ml  ? ? ? ?Assessment/Plan:  61 y.o. 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 Scot Dock and subsequent left BKA washout and debridement on 06/17/2021 by Dr. Virl Cagey  ?2 Days Post-Op ? ? ?-amputation site is  clean.  Continue wet to dry dressings daily ?-still with RLE pain.  Plan for angiogram tomorrow.  Npo after MN, consent/labs ordered ? ? ? ?Leontine Locket, PA-C ?[facility] ?[phone] ?07/07/2021 ?9:01 AM ? ? ? ?I have interviewed and examined patient with PA and agree with assessment and plan above.  Right leg pain remains his chief complaint and we will plan for angiography tomorrow in the Cath Lab.  He will be n.p.o. past midnight. ABI ordered.  ? ?Aleksia Freiman C. Donzetta Matters, MD ?[facility] ?Office: [phone] ?Pager: [phone] ? ?

## 2021-07-07 NOTE — Progress Notes (Signed)
[facility] ?Progress Note ? ?Subjective: seen in room, no c/o's. Hb stable 8.5.  ? ?Vitals:  ? 07/06/21 2349 07/07/21 0430 07/07/21 0948 07/07/21 1357  ?BP: 137/74 (!) 144/85 (!) 121/91 122/69  ?Pulse: 75 84 83 80  ?Resp: '20 20 18 18  '$ ?Temp: (!) 97.5 ?F (36.4 ?C) 97.6 ?F (36.4 ?C) 97.6 ?F (36.4 ?C) 98 ?F (36.7 ?C)  ?TempSrc: Oral Oral Oral Oral  ?SpO2:  96% 98% 97%  ?Weight:      ?Height:      ? ? ?Exam: ? alert, nad  ? no jvd ? Chest cta bilat ? Cor reg no RG ? Abd soft ntnd no ascites ? MS L BKA wrapped ?Ext no LE or UE edema ?Neuro is alert, Ox 3 , nf ? LUA AVF +bruit ? ? ?  ?  ?  ? Home meds include - norvasc, phoslo 2 ac, clonidine 0.1 mwf, neurontin 100 tid, lopressor '100mg'$  prn, MVI, prosouce plus, protonix, florastor, prns/ vits/ supps ?  ?  ?  ? OP HD: MWF  ?  4h  450/500   119kg   2/2 bath  Hep none  LUA AVF ? - venofer '100mg'$  q HD, 7 left ? - mircera 225 q2, last 3/08 ? - hectorol 1 ug tiw IV ?  ?  ?Assessment/ Plan: ?GI bleed - hx of PUD and bleeding AVMs.  Enteroscopy 3/10 by GI showed non- bleeding AVM's. Flex sig showed fresh bleeding in prox sigmoid colon, colon full of blood.  Keep Hb > 8 given sig CV disease.  Hb up at 8.4 today. GI following.  ?R foot pain/ hx PAD/ hx L BKA - going for angiogram tomorrow per [facility].  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 TCTS saw, advised OP f/u.  ?ESRD- on HD MWF. Next HD Monday.  ?BP/ volume - cont BP medications, no vol excess, on RA, at dry wt  ?Anemia ckd - next esa due 3/22, cont IV Fe load. Transfuse prn per pmd.  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 Ca in range.  ?Dispo - will go back to SNF when ready ? ? ? ? ?Rob Doctor, hospital ?07/07/2021, 3:41 PM ? ? ?Recent Labs  ?Lab 07/03/21 ?1700 07/03/21 ?2144 07/04/21 ?0143 07/04/21 ?8502 07/06/21 ?1631 07/07/21 ?7741  ?HGB 8.0*   < > 7.9*   < > 8.0* 8.5*  ?ALBUMIN 2.6*  --  2.5*  --   --   --   ?CALCIUM 8.8*  --  9.5  --   --   --   ?CREATININE 4.25*  --  5.13*  --   --   --   ?K 4.9  --  3.6  --   --   --   ? < >  = values in this interval not displayed.  ? ? ?Inpatient medications: ? sodium chloride   Intravenous Once  ? amLODipine  10 mg Oral QHS  ? Chlorhexidine Gluconate Cloth  6 each Topical Q0600  ? cloNIDine  0.1 mg Oral Q M,W,F-2000  ? cloNIDine  0.1 mg Oral 2 times per day on Sun Tue Thu Sat  ? gabapentin  100 mg Oral TID  ? hydrocortisone cream   Topical BID  ? lidocaine  1 patch Transdermal Q24H  ? nicotine  14 mg Transdermal Daily  ? pantoprazole  40 mg Oral BID  ? polyethylene glycol  17 g Oral Daily  ? ? ferric gluconate (FERRLECIT) IVPB 125 mg (07/05/21  1737)  ? ?acetaminophen **OR** acetaminophen, hydrOXYzine, oxyCODONE-acetaminophen ? ? ? ? ? ? ?

## 2021-07-07 NOTE — Plan of Care (Signed)
  Problem: Education: Goal: Knowledge of General Education information will improve Description Including pain rating scale, medication(s)/side effects and non-pharmacologic comfort measures Outcome: Progressing   

## 2021-07-07 NOTE — Progress Notes (Signed)
PROGRESS NOTE    Daniel [name]  MIW:[id number] DOB: [birth year] DOA: 07/03/2021 PCP: [name], NP    Brief Narrative:  61 year old with history of ESRD on hemodialysis Monday Wednesday Friday, anemia of chronic disease with baseline hemoglobin 7-8, multiple AV malformations GI tract, recent left below-knee amputation and currently at a skilled nursing facility brought to emergency room from dialysis center after he had a large volume fresh rectal bleed after completing dialysis on 3/8. Patient was mainly complaining of right-sided chest pain, lot of pain on his left below-knee amputation stump and also on the right leg.  Denied any nausea vomiting or abdominal pain or distention. Seen by vascular surgery on arrival.  CT angiogram lower extremities without evidence of acute blockages.    Assessment & Plan:   Acute lower GI bleeding, acute on chronic anemia of blood loss:  Known previous AVMs. Patient had multiple fresh and maroon-colored stool . Hemoglobin down to 7-1 unit PRBC transfusion with appropriate response.  Hemoglobin 8.5 today. No clinical evidence of ongoing bleeding, however patient has not have a bowel movement yet.  Currently hemodynamically stable. Upper GI endoscopy with nonbleeding duodenal ulcer.  Remains on PPI. Underwent flex sig, found fresh bleeding around the proximal sigmoid colon.  Full colonoscopy not done. Remained stable without further bleeding, CT angiogram suggested if any further bleeding. CT angiogram for the lower extremities also showed pooling of blood in the rectal area. Patient is not on any antiplatelets or antithrombotics. Dialysis patient with significant cardiovascular comorbidities, will keep hemoglobin more than 8.  Getting IV iron  -We will need to monitor for any further bleeding before transition into a skilled nursing facility.  Right-sided chest pain: Complaint of severe pleuritic chest pain, CT angiogram of the chest negative  for pulmonary embolism.  Musculoskeletal pain.  Lidocaine patch and Percocet.  Improving.  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  Discussed case with Dr. Kipp Brood, CT CS.  Advised outpatient follow-up.  Referral sent to [facility] health CT CS office.  Severe peripheral vascular disease status post left below-knee amputation: Seen by vascular surgery.  Symptomatic treatment suggested.  CT angiogram without any evidence of acute arterial compromise.  Patient continues to complain of pain on the right foot.  Scheduled for angiogram tomorrow.  Essential hypertension: Blood pressure is stable.  Home medications resumed.  ESRD on hemodialysis: Getting hemodialysis on schedule.  Next 3/13.    DVT prophylaxis: SCDs Start: 07/03/21 2110   Code Status: Full code Family Communication: None Disposition Plan: Status is: Inpatient.  Patient remains inpatient , significant rectal bleeding.  Need to be stabilized before transferring to SNF.   Consultants:  Gastroenterology,  Nephrology,   Procedures:  None  Antimicrobials:  None   Subjective:  Patient seen and examined.  Denies any nausea or vomiting.  No hematochezia.  Matter fact, he had no bowel movement yet after colonoscopy.  Denies any abdominal pain.  Still has some chest pain. His main concern is right foot pain which is ongoing and consistent. Examined with vascular surgery team at the bedside, they suggest angiogram on the right side tomorrow in the operating room.  Patient agreeable.  Hemoglobin is stable.  Objective: Vitals:   07/06/21 1958 07/06/21 2349 07/07/21 0430 07/07/21 0948  BP: 138/84 137/74 (!) 144/85 (!) 121/91  Pulse: 83 75 84 83  Resp: '20 20 20 18  '$ Temp: 98.1 F (36.7 C) (!) 97.5 F (36.4 C)  97.6 F (36.4 C) 97.6 F (36.4 C)  TempSrc: Oral Oral Oral Oral  SpO2:   96% 98%  Weight:      Height:        Intake/Output Summary (Last 24 hours) at 07/07/2021  1015 Last data filed at 07/07/2021 0950 Gross per 24 hour  Intake 240 ml  Output 450 ml  Net -210 ml   Filed Weights   07/05/21 0738 07/05/21 1042 07/06/21 0450  Weight: 118.2 kg 117 kg 117 kg    Examination:  General: Fairly comfortable at rest.  On room air. Cardiovascular: S1-S2 normal.  Regular rate rhythm. Respiratory: Bilateral clear.  No added sounds. Gastrointestinal: Soft.  Nontender.  Bowel sound present. Ext: Left below-knee amputation is stump with some dehiscence, pictures in the chart. Right foot with chronic ischemic changes, Doppler pulse present. AV fistula with thrill present. Neuro: No focal logical deficits. Musculoskeletal: No deformities.    Data Reviewed: I have personally reviewed following labs and imaging studies  CBC: Recent Labs  Lab 07/03/21 1700 07/03/21 2144 07/04/21 0143 07/04/21 0906 07/05/21 0507 07/05/21 1729 07/06/21 0551 07/06/21 1631 07/07/21 0432  WBC 4.4  --  5.5  --   --   --   --   --   --   NEUTROABS 2.2  --  2.6  --   --   --   --   --   --   HGB 8.0*   < > 7.9*   < > 7.0* 8.7* 7.9* 8.0* 8.5*  HCT 25.3*   < > 24.0*   < > 21.4* 26.0* 24.6* 25.2* 26.8*  MCV 108.1*  --  104.8*  --   --   --   --   --   --   PLT 173  --  161  --   --   --   --   --   --    < > = values in this interval not displayed.   Basic Metabolic Panel: Recent Labs  Lab 07/03/21 1700 07/04/21 0143  NA 140 141  K 4.9 3.6  CL 95* 95*  CO2 27 29  GLUCOSE 94 86  BUN 11 12  CREATININE 4.25* 5.13*  CALCIUM 8.8* 9.5  MG  --  2.0   GFR: Estimated Creatinine Clearance: 19.7 mL/min (A) (by C-G formula based on SCr of 5.13 mg/dL (H)). Liver Function Tests: Recent Labs  Lab 07/03/21 1700 07/04/21 0143  AST 82* 51*  ALT 16 16  ALKPHOS 64 59  BILITOT 2.0* 0.3  PROT 7.7 8.1  ALBUMIN 2.6* 2.5*   No results for input(s): LIPASE, AMYLASE in the last 168 hours. No results for input(s): AMMONIA in the last 168 hours. Coagulation Profile: Recent  Labs  Lab 07/03/21 1700 07/04/21 0554  INR 1.1 1.1   Cardiac Enzymes: No results for input(s): CKTOTAL, CKMB, CKMBINDEX, TROPONINI in the last 168 hours. BNP (last 3 results) No results for input(s): PROBNP in the last 8760 hours. HbA1C: No results for input(s): HGBA1C in the last 72 hours. CBG: Recent Labs  Lab 07/05/21 1309  GLUCAP 79   Lipid Profile: No results for input(s): CHOL, HDL, LDLCALC, TRIG, CHOLHDL, LDLDIRECT in the last 72 hours. Thyroid Function Tests: No results for input(s): TSH, T4TOTAL, FREET4, T3FREE, THYROIDAB in the last 72 hours. Anemia Panel: No results for input(s): VITAMINB12, FOLATE, FERRITIN, TIBC, IRON, RETICCTPCT in the last 72 hours.  Sepsis Labs: No results for input(s): PROCALCITON, LATICACIDVEN in the last  168 hours.  Recent Results (from the past 240 hour(s))  Resp Panel by RT-PCR (Flu A&B, Covid) Nasopharyngeal Swab     Status: None   Collection Time: 07/03/21  9:07 PM   Specimen: Nasopharyngeal Swab; Nasopharyngeal(NP) swabs in vial transport medium  Result Value Ref Range Status   SARS Coronavirus 2 by RT PCR NEGATIVE NEGATIVE Final    Comment: (NOTE) SARS-CoV-2 target nucleic acids are NOT DETECTED.  The SARS-CoV-2 RNA is generally detectable in upper respiratory specimens during the acute phase of infection. The lowest concentration of SARS-CoV-2 viral copies this assay can detect is 138 copies/mL. A negative result does not preclude SARS-Cov-2 infection and should not be used as the sole basis for treatment or other patient management decisions. A negative result may occur with  improper specimen collection/handling, submission of specimen other than nasopharyngeal swab, presence of viral mutation(s) within the areas targeted by this assay, and inadequate number of viral copies(<138 copies/mL). A negative result must be combined with clinical observations, patient history, and epidemiological information. The expected result is  Negative.  Fact Sheet for Patients:  EntrepreneurPulse.com.au  Fact Sheet for Healthcare Providers:  IncredibleEmployment.be  This test is no t yet approved or cleared by the [place] FDA and  has been authorized for detection and/or diagnosis of SARS-CoV-2 by FDA under an Emergency Use Authorization (EUA). This EUA will remain  in effect (meaning this test can be used) for the duration of the COVID-19 declaration under Section 564(b)(1) of the Act, 21 U.S.C.section 360bbb-3(b)(1), unless the authorization is terminated  or revoked sooner.       Influenza A by PCR NEGATIVE NEGATIVE Final   Influenza B by PCR NEGATIVE NEGATIVE Final    Comment: (NOTE) The Xpert Xpress SARS-CoV-2/FLU/RSV plus assay is intended as an aid in the diagnosis of influenza from Nasopharyngeal swab specimens and should not be used as a sole basis for treatment. Nasal washings and aspirates are unacceptable for Xpert Xpress SARS-CoV-2/FLU/RSV testing.  Fact Sheet for Patients: EntrepreneurPulse.com.au  Fact Sheet for Healthcare Providers: IncredibleEmployment.be  This test is not yet approved or cleared by the [place] FDA and has been authorized for detection and/or diagnosis of SARS-CoV-2 by FDA under an Emergency Use Authorization (EUA). This EUA will remain in effect (meaning this test can be used) for the duration of the COVID-19 declaration under Section 564(b)(1) of the Act, 21 U.S.C. section 360bbb-3(b)(1), unless the authorization is terminated or revoked.  Performed at [facility], [place] [address] [place], [place] [address]          Radiology Studies: No results found.      Scheduled Meds:  sodium chloride   Intravenous Once   amLODipine  10 mg Oral QHS   Chlorhexidine Gluconate Cloth  6 each Topical Q0600   cloNIDine  0.1 mg Oral Q M,W,F-2000   cloNIDine  0.1 mg Oral 2 times per day on  Sun Tue Thu Sat   gabapentin  100 mg Oral TID   hydrocortisone cream   Topical BID   lidocaine  1 patch Transdermal Q24H   nicotine  14 mg Transdermal Daily   pantoprazole  40 mg Oral BID   polyethylene glycol  17 g Oral Daily   Continuous Infusions:  ferric gluconate (FERRLECIT) IVPB 125 mg (07/05/21 1737)     LOS: 3 days    Time spent: 35 minutes    Barb Merino, MD [facility] Pager [phone]

## 2021-07-07 NOTE — Progress Notes (Signed)
? ? ? ?  [facility] Progress Note ? ?CC:  No GI complaints today ? ?Assessment / Plan: ?Acute GI bleeding with associated GI blood loss anemia. CTA 07/03/21 showed pooling in the rectum that the radiologist suggested might be due to hemorrhoidal bleeding. No clear source on push enteroscopy or flexible sigmoidoscopy with Dr. Benson Norway 07/05/21. However, blood present throughout the examined colon.  In addition, he has been evaluated for recurrent GI bleeding several times of the last year without an obvious source identified on multiple EGDs and non-contrasted CT scans of the abd/pelvis.  Colonoscopy with Dr. Cristina Gong in 2021 (the last time he had a full colonoscopy) showed sigmoid diverticulosis, making a diverticular bleed a possibility. Not on antiplatelets or antithrombotics. Hemoglobin now stable. No bowel movement since the sigmoidoscopy. Recommend CTA with additional significant overt bleeding. Consider full colonoscopy after a full bowel prep if there is continued slow bleeding. Continue serial hgb/hct with transfusion as indicated in the meantime.  ? ?History of known AVMs: Identified small bowel AVMs treated 07/05/21 but not thought to be the source of hematochezia.  More distal AVMs are a consideration.  ? ?Subsolid density in the RLL on CTA of the chest 07/04/21: Radiology recommended thoracic surgery consultation. Will defer to hospitalist team.  ? ?I am covering for Dr. Benson Norway who will be back 07/08/21.  ? ?Subjective: ?No bowel movement since small bowel enteroscopy and flex sig 07/05/21. No further bleeding.  ? ?Continues to have questions about the plans with his PVD. GI ROS is negative.  ? ?No family was present at the time of my evaluation.  ? ?Objective:  ?Vital signs in last 24 hours: ?Temp:  [97.5 ?F (36.4 ?C)-98.1 ?F (36.7 ?C)] 97.6 ?F (36.4 ?C) (03/12 0430) ?Pulse Rate:  [75-84] 84 (03/12 0430) ?Resp:  [18-20] 20 (03/12 0430) ?BP: (128-144)/(74-85) 144/85 (03/12 0430) ?SpO2:  [96 %-97 %] 96 %  (03/12 0430) ?Last BM Date : 07/05/21 ?General:   Alert, in NAD ?Heart:  Regular rate and rhythm; no murmurs ?Pulm: Clear anteriorly; no wheezing ?Abdomen:  Soft. Central obesity. Nontender. Nondistended. Normal bowel sounds. No rebound or guarding. ?LAD: No inguinal or umbilical LAD ?Extremities:  Left BKA stump present. Right foot with chronic ischemic changes.[name] ?Neurologic:  Alert and  oriented x4;  grossly normal neurologically. ?Psych:  Alert and cooperative. Normal mood and affect. ? ? ?Lab Results: ?Recent Labs  ?  07/06/21 ?0569 07/06/21 ?1631 07/07/21 ?0432  ?HGB 7.9* 8.0* 8.5*  ?HCT 24.6* 25.2* 26.8*  ? ? ?Push enteroscopy: Gastric and duodenal AVMs identified and treated with APC but Dr. Benson Norway did not feel these were the source of his hematochezia.  ? ?Flexible sigmoidoscopy: Blood throughout the examined colon. No specific source identified.  Bleeding scan recommended.  ? ? LOS: 3 days  ? ?Th[place]ton [place]  07/07/2021, 7:34 AM ? ?  ?

## 2021-07-07 NOTE — Progress Notes (Signed)
VASCULAR LAB ? ? ? ?ABIs have been performed. ? ?See CV proc for preliminary results. ? ? ?Avante Carneiro, RVT ?07/07/2021, 1:23 PM ? ?

## 2021-07-08 ENCOUNTER — Encounter (HOSPITAL_COMMUNITY)
Admission: EM | Disposition: A | Discharge status: Skilled Nursing Facility | Payer: Self-pay | Source: Home / Self Care | Attending: Internal Medicine

## 2021-07-08 ENCOUNTER — Encounter (HOSPITAL_COMMUNITY): Payer: Self-pay | Admitting: Gastroenterology

## 2021-07-08 DIAGNOSIS — I10 Essential (primary) hypertension: Secondary | ICD-10-CM | POA: Diagnosis not present

## 2021-07-08 DIAGNOSIS — I70221 Atherosclerosis of native arteries of extremities with rest pain, right leg: Secondary | ICD-10-CM

## 2021-07-08 DIAGNOSIS — K922 Gastrointestinal hemorrhage, unspecified: Secondary | ICD-10-CM | POA: Diagnosis not present

## 2021-07-08 DIAGNOSIS — R9431 Abnormal electrocardiogram [ECG] [EKG]: Secondary | ICD-10-CM | POA: Diagnosis not present

## 2021-07-08 DIAGNOSIS — N186 End stage renal disease: Secondary | ICD-10-CM | POA: Diagnosis not present

## 2021-07-08 LAB — CBC
HCT: 26 % — ABNORMAL LOW (ref 39.0–52.0)
Hemoglobin: 8.3 g/dL — ABNORMAL LOW (ref 13.0–17.0)
MCH: 34.3 pg — ABNORMAL HIGH (ref 26.0–34.0)
MCHC: 31.9 g/dL (ref 30.0–36.0)
MCV: 107.4 fL — ABNORMAL HIGH (ref 80.0–100.0)
Platelets: 152 10*3/uL (ref 150–400)
RBC: 2.42 MIL/uL — ABNORMAL LOW (ref 4.22–5.81)
RDW: 20.3 % — ABNORMAL HIGH (ref 11.5–15.5)
WBC: 5.6 10*3/uL (ref 4.0–10.5)
nRBC: 1.4 % — ABNORMAL HIGH (ref 0.0–0.2)

## 2021-07-08 LAB — BASIC METABOLIC PANEL
Anion gap: 12 (ref 5–15)
BUN: 27 mg/dL — ABNORMAL HIGH (ref 8–23)
CO2: 26 mmol/L (ref 22–32)
Calcium: 9.8 mg/dL (ref 8.9–10.3)
Chloride: 99 mmol/L (ref 98–111)
Creatinine, Ser: 8.76 mg/dL — ABNORMAL HIGH (ref 0.61–1.24)
GFR, Estimated: 6 mL/min — ABNORMAL LOW (ref >60)
Glucose, Bld: 110 mg/dL — ABNORMAL HIGH (ref 70–99)
Potassium: 4.3 mmol/L (ref 3.5–5.1)
Sodium: 137 mmol/L (ref 135–145)

## 2021-07-08 SURGERY — LOWER EXTREMITY ANGIOGRAPHY
Anesthesia: LOCAL | Laterality: Right

## 2021-07-08 MED ORDER — SODIUM CHLORIDE 0.9 % IV SOLN: 250.0000 mL | INTRAVENOUS | Status: DC | PRN

## 2021-07-08 MED ORDER — HYDRALAZINE HCL 20 MG/ML IJ SOLN: 5.0000 mg | INTRAMUSCULAR | Status: DC | PRN

## 2021-07-08 MED ORDER — CLOPIDOGREL BISULFATE 75 MG PO TABS
75.0000 mg | ORAL_TABLET | Freq: Every day | ORAL | Status: DC
Start: 2021-07-09 — End: 2021-07-10
  Administered 2021-07-09 – 2021-07-10 (×2): 75 mg via ORAL
  Filled 2021-07-08 (×2): qty 1

## 2021-07-08 MED ORDER — HEPARIN SODIUM (PORCINE) 1000 UNIT/ML IJ SOLN
INTRAMUSCULAR | Status: DC | PRN
  Administered 2021-07-08: 10000 [IU] via INTRAVENOUS

## 2021-07-08 MED ORDER — HEPARIN SODIUM (PORCINE) 5000 UNIT/ML IJ SOLN
5000.0000 [IU] | Freq: Three times a day (TID) | INTRAMUSCULAR | Status: DC
  Administered 2021-07-08 – 2021-07-10 (×5): 5000 [IU] via SUBCUTANEOUS
  Filled 2021-07-08 (×5): qty 1

## 2021-07-08 MED ORDER — CLOPIDOGREL BISULFATE 300 MG PO TABS
ORAL_TABLET | ORAL | Status: DC | PRN
  Administered 2021-07-08: 300 mg via ORAL

## 2021-07-08 MED ORDER — HEPARIN (PORCINE) IN NACL 1000-0.9 UT/500ML-% IV SOLN
INTRAVENOUS | Status: DC | PRN
  Administered 2021-07-08 (×2): 500 mL

## 2021-07-08 MED ORDER — HEPARIN (PORCINE) IN NACL 1000-0.9 UT/500ML-% IV SOLN
INTRAVENOUS | Status: AC
  Filled 2021-07-08: qty 1000

## 2021-07-08 MED ORDER — MIDAZOLAM HCL 2 MG/2ML IJ SOLN
INTRAMUSCULAR | Status: AC
  Filled 2021-07-08: qty 2

## 2021-07-08 MED ORDER — FENTANYL CITRATE (PF) 100 MCG/2ML IJ SOLN
INTRAMUSCULAR | Status: DC | PRN
  Administered 2021-07-08: 50 ug via INTRAVENOUS

## 2021-07-08 MED ORDER — SODIUM CHLORIDE 0.9% FLUSH: 3.0000 mL | INTRAVENOUS | Status: DC | PRN

## 2021-07-08 MED ORDER — MIDAZOLAM HCL 2 MG/2ML IJ SOLN
INTRAMUSCULAR | Status: DC | PRN
  Administered 2021-07-08: 1 mg via INTRAVENOUS

## 2021-07-08 MED ORDER — FENTANYL CITRATE (PF) 100 MCG/2ML IJ SOLN
INTRAMUSCULAR | Status: AC
  Filled 2021-07-08: qty 2

## 2021-07-08 MED ORDER — LIDOCAINE HCL (PF) 1 % IJ SOLN
INTRAMUSCULAR | Status: AC
  Filled 2021-07-08: qty 30

## 2021-07-08 MED ORDER — ASPIRIN EC 81 MG PO TBEC
81.0000 mg | DELAYED_RELEASE_TABLET | Freq: Every day | ORAL | Status: DC
  Administered 2021-07-08 – 2021-07-10 (×3): 81 mg via ORAL
  Filled 2021-07-08 (×3): qty 1

## 2021-07-08 MED ORDER — SODIUM CHLORIDE 0.9% FLUSH
3.0000 mL | Freq: Two times a day (BID) | INTRAVENOUS | Status: DC
  Administered 2021-07-08 – 2021-07-10 (×5): 3 mL via INTRAVENOUS

## 2021-07-08 MED ORDER — LABETALOL HCL 5 MG/ML IV SOLN: 10.0000 mg | INTRAVENOUS | Status: DC | PRN

## 2021-07-08 MED ORDER — LIDOCAINE HCL (PF) 1 % IJ SOLN
INTRAMUSCULAR | Status: DC | PRN
  Administered 2021-07-08: 19 mL via INTRADERMAL

## 2021-07-08 MED ORDER — ATORVASTATIN CALCIUM 40 MG PO TABS
40.0000 mg | ORAL_TABLET | Freq: Every day | ORAL | Status: DC
  Administered 2021-07-08 – 2021-07-10 (×3): 40 mg via ORAL
  Filled 2021-07-08 (×3): qty 1

## 2021-07-08 MED ORDER — CLOPIDOGREL BISULFATE 75 MG PO TABS: 75.0000 mg | ORAL_TABLET | Freq: Every day | ORAL | Status: DC

## 2021-07-08 MED ORDER — CLOPIDOGREL BISULFATE 300 MG PO TABS
ORAL_TABLET | ORAL | Status: AC
  Filled 2021-07-08: qty 1

## 2021-07-08 SURGICAL SUPPLY — 17 items
BALLN MUSTANG 6.0X40 135 (BALLOONS) ×3
BALLOON MUSTANG 6.0X40 135 (BALLOONS) IMPLANT
CATH OMNI FLUSH 5F 65CM (CATHETERS) ×1 IMPLANT
CLOSURE PERCLOSE PROSTYLE (VASCULAR PRODUCTS) ×1 IMPLANT
DEVICE CLOSURE MYNXGRIP 6/7F (Vascular Products) ×1 IMPLANT
GLIDEWIRE ADV .035X260CM (WIRE) ×1 IMPLANT
KIT ENCORE 26 ADVANTAGE (KITS) ×1 IMPLANT
KIT MICROPUNCTURE NIT STIFF (SHEATH) ×1 IMPLANT
KIT PV (KITS) ×3 IMPLANT
SHEATH PINNACLE 5F 10CM (SHEATH) ×1 IMPLANT
SHEATH PINNACLE 6F 10CM (SHEATH) ×1 IMPLANT
SHEATH PINNACLE ST 6F 45CM (SHEATH) ×1 IMPLANT
STENT ELUVIA 7X40X130 (Permanent Stent) ×1 IMPLANT
SYR MEDRAD MARK 7 150ML (SYRINGE) ×3 IMPLANT
TRANSDUCER W/STOPCOCK (MISCELLANEOUS) ×3 IMPLANT
TRAY PV CATH (CUSTOM PROCEDURE TRAY) ×3 IMPLANT
WIRE BENTSON .035X145CM (WIRE) ×1 IMPLANT

## 2021-07-08 NOTE — Care Management Important Message (Signed)
Important Message ? ?Patient Details  ?Name: Daniel [name] ?MRN: [id number] ?Date of Birth: 10-15-1960 ? ? ?Medicare Important Message Given:  Yes ? ? ? ? ?Shelda Altes ?07/08/2021, 11:57 AM ?

## 2021-07-08 NOTE — Progress Notes (Addendum)
UNASSIGNED PATIENT Subjective: Patient denies any GI complaints at this time. He denies any abdominal pain, nausea or vomiting. There is no evidence of ongoing rectal bleeding since his flexible sigmoidoscopy. Events of the weekend noted.  He had a CT angiogram done on 07/04/2021) he was noted to have a 2.2 cm subsolid density in the superior aspect of the right lower lobe.  He seems to be very worried about this..  Objective: Vital signs in last 24 hours: Temp:  [97.6 F (36.4 C)-98.2 F (36.8 C)] 97.9 F (36.6 C) (03/13 0506) Pulse Rate:  [78-84] 78 (03/13 0506) Resp:  [17-20] 20 (03/13 0506) BP: (121-150)/(69-98) 150/87 (03/13 0506) SpO2:  [93 %-98 %] 93 % (03/13 0506) Weight:  [121.6 kg] 121.6 kg (03/13 0506) Last BM Date : 07/05/21  Intake/Output from previous Daniel: 03/12 0701 - 03/13 0700 In: 120 [P.O.:120] Out: 150 [Urine:150] Intake/Output this shift: Total I/O In: 120 [P.O.:120] Out: 150 [Urine:150]  General appearance: alert, cooperative, appears stated age, no distress, and morbidly obese Resp: clear to auscultation bilaterally Cardio: regular rate and rhythm, S1, S2 normal, no murmur, click, rub or gallop GI: soft, non-tender; bowel sounds normal; no masses,  no organomegaly  Lab Results: Recent Labs    07/06/21 1631 07/07/21 0432 07/08/21 0301  WBC  --   --  5.6  HGB 8.0* 8.5* 8.3*  HCT 25.2* 26.8* 26.0*  PLT  --   --  152   BMET Recent Labs    07/08/21 0301  NA 137  K 4.3  CL 99  CO2 26  GLUCOSE 110*  BUN 27*  CREATININE 8.76*  CALCIUM 9.8   Studies/Results: VAS [place] ABI WITH/WO TBI  Result Date: 07/07/2021  LOWER EXTREMITY DOPPLER STUDY Patient Name:  Daniel [name]  Date of Exam:   07/07/2021 Medical Rec #: [id number]           Accession #:    [id number] Date of Birth: 18-May-1960           Patient Gender: M Patient Age:   61 years Exam Location:  [facility] Procedure:      VAS [place] ABI WITH/WO TBI Referring Phys: Lemar Livings  --------------------------------------------------------------------------------  Indications: Rest pain, and peripheral artery disease. High Risk Factors: Hypertension. Other Factors: ESRD, on dialysis.  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 Comparison Study: Prior ABI done 08/29/2020 Performing Technologist: Sherren Kerns RVS  Examination Guidelines: A complete evaluation includes at minimum, Doppler waveform signals and systolic blood pressure reading at the level of bilateral brachial, anterior tibial, and posterior tibial arteries, when vessel segments are accessible. Bilateral testing is considered an integral part of a complete examination. Photoelectric Plethysmograph (PPG) waveforms and toe systolic pressure readings are included as required and additional duplex testing as needed. Limited examinations for reoccurring indications may be performed as noted.  ABI Findings: +---------+------------------+-----+-----------+--------+ Right    Rt Pressure (mmHg)IndexWaveform   Comment  +---------+------------------+-----+-----------+--------+ Brachial 128                    multiphasic         +---------+------------------+-----+-----------+--------+ PTA      254               1.98 multiphasic         +---------+------------------+-----+-----------+--------+ DP       254               1.98 multiphasic         +---------+------------------+-----+-----------+--------+ Daniel [name]  0.48                     +---------+------------------+-----+-----------+--------+ +---------+------------------+-----+--------+-------------------+ Left     Lt Pressure (mmHg)IndexWaveformComment             +---------+------------------+-----+--------+-------------------+ Brachial                                restricted dialysis +---------+------------------+-----+--------+-------------------+ PTA                                     BKA                  +---------+------------------+-----+--------+-------------------+ DP                                      BKA                 +---------+------------------+-----+--------+-------------------+ Great Toe                               BKA                 +---------+------------------+-----+--------+-------------------+ +-------+-------------+-----------+-------------+------------+ ABI/TBIToday's ABI  Today's TBIPrevious ABI Previous TBI +-------+-------------+-----------+-------------+------------+ Right  1.98 non comp0.48       1.39 non comp0.47         +-------+-------------+-----------+-------------+------------+ Left   BKA          BKA        BKA          BKA          +-------+-------------+-----------+-------------+------------+ Right ABIs appear essentially unchanged compared to prior study on 08/29/2020. Right TBIs appear essentially unchanged compared to prior study on 08/29/2020.  Summary: Right: Resting right ankle-brachial index indicates noncompressible right lower extremity arteries. The right toe-brachial index is abnormal. Left: BKA.  *See table(s) above for measurements and observations.  Electronically signed by Lemar Livings MD on 07/07/2021 at 4:18:48 PM.    Final     Medications: I have reviewed the patient's current medications. Prior to Admission:  Medications Prior to Admission  Medication Sig Dispense Refill Last Dose   amLODipine (NORVASC) 10 MG tablet Take 1 tablet (10 mg total) by mouth at bedtime. 90 tablet 0 07/02/2021   calcium acetate (PHOSLO) 667 MG capsule Take 1,334 mg by mouth 3 (three) times daily with meals.   07/03/2021 at am   cloNIDine (CATAPRES) 0.1 MG tablet Take 0.1-0.2 mg by mouth See admin instructions. Take two tablet (0.2 mg) twice daily at 9am and 6pm on Sunday, Tuesday, Thursday, Saturday (non-dialysis days), take one tablet (0.1 mg) at bedtime - 9pm on Monday, Wednesday, Friday (dialysis days)   07/02/2021   ferrous sulfate 325 (65 FE) MG  tablet Take 325 mg by mouth every morning.   07/03/2021   gabapentin (NEURONTIN) 100 MG capsule Take 1 capsule (100 mg total) by mouth 3 (three) times daily. 90 capsule 3 07/03/2021 at am   hydrOXYzine (ATARAX) 25 MG tablet TAKE 1 TABLET BY MOUTH EVERY 8 HOURS AS NEEDED FOR ANXIETY OR  ITCHING Strength: 25 mg (Patient taking differently: Take 25 mg by mouth every 8 (eight) hours as needed for itching.) 30 tablet 0 unknown   Lidocaine 5 %  CREA Apply 1 application. topically every Monday, Wednesday, and Friday. Dialysis days - for itching   07/01/2021   loperamide (IMODIUM) 2 MG capsule Take 2 capsules (4 mg total) by mouth 3 (three) times daily as needed for diarrhea or loose stools. (Patient taking differently: Take 4 mg by mouth every 8 (eight) hours as needed for diarrhea or loose stools.) 30 capsule 0 unknown   Multiple Vitamin (MULTIVITAMIN WITH MINERALS) TABS tablet Take 1 tablet by mouth every morning.   07/03/2021   Nutritional Supplements (,FEEDING SUPPLEMENT, PROSOURCE PLUS) liquid Take 30 mLs by mouth 3 (three) times daily.   07/03/2021 at am   pantoprazole (PROTONIX) 40 MG tablet Take 1 tablet (40 mg total) by mouth 2 (two) times daily. 60 tablet 2 07/03/2021 at am   saccharomyces boulardii (FLORASTOR) 250 MG capsule Take 1 capsule (250 mg total) by mouth 2 (two) times daily.   07/03/2021 at am   silver sulfADIAZINE (SILVADENE) 1 % cream Apply 1 application. topically every evening. To lower back   07/02/2021   [DISCONTINUED] HYDROcodone-acetaminophen (NORCO/VICODIN) 5-325 MG tablet Take 1 tablet by mouth every 6 (six) hours as needed for moderate pain.   06/28/2021   blood glucose meter kit and supplies KIT Dispense based on patient and insurance preference. Use up to four times daily as directed. (FOR ICD-9 250.00, 250.01). 1 each 0    metoprolol tartrate (LOPRESSOR) 100 MG tablet Take 1 tablet (100 mg total) by mouth once for 1 dose. To be taken 2 hours prior to procedure. (Patient not taking: Reported on  07/05/2021) 1 tablet 0 Not Taking   Scheduled:  sodium chloride   Intravenous Once   amLODipine  10 mg Oral QHS   aspirin EC  81 mg Oral Daily   atorvastatin  40 mg Oral Daily   Chlorhexidine Gluconate Cloth  6 each Topical Q0600   cloNIDine  0.1 mg Oral Q M,W,F-2000   cloNIDine  0.1 mg Oral 2 times per Daniel on Sun Tue Thu Sat   [START ON 07/09/2021] clopidogrel  75 mg Oral Daily   gabapentin  100 mg Oral TID   heparin  5,000 Units Subcutaneous Q8H   hydrocortisone cream   Topical BID   lidocaine  1 patch Transdermal Q24H   nicotine  14 mg Transdermal Daily   pantoprazole  40 mg Oral BID   polyethylene glycol  17 g Oral Daily   sodium chloride flush  3 mL Intravenous Q12H   Continuous:  sodium chloride     ferric gluconate (FERRLECIT) IVPB 125 mg (07/08/21 1702)    Assessment/Plan: Severe anemia status post GI bleeding in the setting of 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  LOS: 4 days   Charna Elizabeth 07/08/2021, 6:28 AM

## 2021-07-08 NOTE — Progress Notes (Signed)
[facility] ?Progress Note ? ? OP HD: MWF  ?  4h  450/500   119kg   2/2 bath  Hep none  LUA AVF ? - venofer '100mg'$  q HD, 7 left ? - mircera 225 q2, last 3/08 ? - hectorol 1 ug tiw IV ?  ?  ?Assessment/ Plan: ?GI bleed - hx of PUD and bleeding AVMs.  Enteroscopy 3/10 by GI showed non- bleeding AVM's. Flex sig showed fresh bleeding in prox sigmoid colon, colon full of blood.  Keep Hb > 8 given sig CV disease.  Hb up at 8.4 today. GI following.  ?R foot pain/ hx PAD/ hx L BKA - going for angiogram today  per [facility].  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 TCTS saw, advised OP f/u.  ?ESRD- on HD MWF. Next HD Monday; unclear timing depending on angiogram.  ?BP/ volume - cont BP medications, no vol excess, on RA, at dry wt  ?Anemia ckd - next esa due 3/22, cont IV Fe load. Transfuse prn per pmd.  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 Ca in range.  ?Dispo - will go back to SNF when ready ? ?Subjective: seen in room, no c/o's. Denies f/c/n/v/sob ? ?Vitals:  ? 07/07/21 1700 07/07/21 2020 07/07/21 2350 07/08/21 0506  ?BP: (!) 131/98 129/70 131/76 (!) 150/87  ?Pulse: 82 84 81 78  ?Resp: '17 18 20 20  '$ ?Temp: 97.9 ?F (36.6 ?C) 98 ?F (36.7 ?C) 98.2 ?F (36.8 ?C) 97.9 ?F (36.6 ?C)  ?TempSrc: Oral Oral Oral Oral  ?SpO2: 98% 94% 93% 93%  ?Weight:    121.6 kg  ?Height:      ? ? ?Exam: ? alert, nad  ? no jvd ? Chest cta bilat ? Cor reg no RG ? Abd soft ntnd no ascites ? MS L BKA wrapped ?Ext no LE or UE edema ?Neuro is alert, Ox 3 , nf ? LUA AVF +bruit ? ? ?  ?  ?  ? Home meds include - norvasc, phoslo 2 ac, clonidine 0.1 mwf, neurontin 100 tid, lopressor '100mg'$  prn, MVI, prosouce plus, protonix, florastor, prns/ vits/ supps ?  ?  ? ? ?Recent Labs  ?Lab 07/03/21 ?1700 07/03/21 ?2144 07/04/21 ?0143 07/04/21 ?7858 07/07/21 ?8502 07/08/21 ?0301  ?HGB 8.0*   < > 7.9*   < > 8.5* 8.3*  ?ALBUMIN 2.6*  --  2.5*  --   --   --   ?CALCIUM 8.8*  --  9.5  --   --  9.8  ?CREATININE 4.25*  --  5.13*  --   --  8.76*  ?K 4.9  --  3.6  --   --  4.3  ?  < > = values in this interval not displayed.  ? ?Inpatient medications: ? sodium chloride   Intravenous Once  ? amLODipine  10 mg Oral QHS  ? Chlorhexidine Gluconate Cloth  6 each Topical Q0600  ? cloNIDine  0.1 mg Oral Q M,W,F-2000  ? cloNIDine  0.1 mg Oral 2 times per day on Sun Tue Thu Sat  ? gabapentin  100 mg Oral TID  ? hydrocortisone cream   Topical BID  ? lidocaine  1 patch Transdermal Q24H  ? nicotine  14 mg Transdermal Daily  ? pantoprazole  40 mg Oral BID  ? polyethylene glycol  17 g Oral Daily  ? ? ferric gluconate (FERRLECIT) IVPB 125 mg (07/05/21 1737)  ? ?acetaminophen **OR** acetaminophen, hydrOXYzine, oxyCODONE-acetaminophen ? ? ? ? ? ? ?

## 2021-07-08 NOTE — Op Note (Signed)
DATE OF SERVICE: 07/08/2021 ? ?PATIENT:  Daniel [name]  61 y.o. male ? 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 ?POST-OPERATIVE DIAGNOSIS:  Same ? ?PROCEDURE:   ?1) US guided left common femoral access ?2) Aortogram ?3) Right lower extremity angiogram with third order cannulation (149m total contrast) ?4) Right superficial femoral artery angioplasty and stenting ([id number]) ?5) Conscious sedation (42 minutes) ? ?SURGEON:  ThYevonne AlineHaStanford BreedMD ? ?ASSISTANT: none ? ?ANESTHESIA:   local and IV sedation ? ?ESTIMATED BLOOD LOSS: minimal ? ?LOCAL MEDICATIONS USED:  LIDOCAINE  ? ?COUNTS: confirmed correct. ? ?PATIENT DISPOSITION:  PACU - hemodynamically stable. ?  ?Delay start of Pharmacological VTE agent (>24hrs) due to surgical blood loss or risk of bleeding: no ? ?INDICATION FOR PROCEDURE: [name] with ischemic rest pain of the right foot with known peripheral arterial disease. After careful discussion of risks, benefits, and alternatives the patient was offered angiography with intervention. The patient understood and wished to proceed. ? ?OPERATIVE FINDINGS:  ?Terminal aorta and iliac arteries: ?Widely patent and tortuous.  ? ?Right lower extremity: ?Common femoral artery: Heavily calcified, but widely patent ?Profunda femoris artery: Heavily calcified, but widely patent  ?Superficial femoral artery: Heavily calcified. Focal severe stenosis in the mid SFA.  Reconstitution of the SFA medially distal to this critical stenosis. ?Popliteal artery: Heavily calcified, but widely patent ?Anterior tibial artery: occludes distal to the origin. ?Tibioperoneal trunk: Heavily calcified, but widely patent ?Peroneal artery: Heavily calcified. Occludes in distal ankle. ?Posterior tibial artery: Dominant tibial vessel. Courses to the foot and fills the pedal arch. ?Pedal circulation: disadvantaged ? ?DESCRIPTION OF  PROCEDURE: After identification of the patient in the pre-operative holding area, the patient was transferred to the operating room. The patient was positioned supine on the operating room table. Anesthesia was induced. The groins was prepped and draped in standard fashion. A surgical pause was performed confirming correct patient, procedure, and operative location. ? ?The left groin was anesthetized with subcutaneous injection of 1% lidocaine. Using ultrasound guidance, the left common femoral artery was accessed with micropuncture technique. Fluoroscopy was used to confirm cannulation over the femoral head. The 74F sheath was upsized to 52F.  ? ?A Benson wire was advanced into the distal aorta. Over the wire an omni flush catheter was advanced to the level of L2. Aortogram was performed - see above for details.  ? ?The right common iliac artery was selected with a Benson guidewire. The wire was advanced into the common femoral artery. Over the wire the omni flush catheter was advanced into the external iliac artery. Selective angiography was performed - see above for details.  ? ?The decision was made to intervene. The patient was heparinized with 10,000 units of heparin. The 52F sheath was exchanged for a 63F x 45cm sheath. Selective angiography of the left lower extremity was performed prior to intervention.  ? ?The lesions were treated with: ?Right superficial femoral artery angioplasty and stenting ([id number]) ? ?Completion angiography revealed:  ?Resolution of the stenosis ? ?A mynx was used to close the arteriotomy. Hemostasis was excellent upon completion. ? ?Conscious sedation was administered with the use of IV fentanyl and midazolam under continuous physician and nurse monitoring.  Heart rate, blood pressure, and oxygen saturation were continuously monitored.  Total sedation time was 42 minutes ? ?Upon completion of the case instrument and sharps counts were confirmed correct. The patient was transferred  to the PACU in good condition. I was present for all portions of the procedure. ? ?PLAN: Good techincal result. ASA '81mg'$  PO QD indefinitely. Plavix '75mg'$  PO QD x 12 months. High intensity statin therapy indefinitely. Follow up with [facility] in 1 month with ABI and RLE duplex. ? ?Yevonne Aline. Stanford Breed, MD ?[facility] ?Office Phone Number: [phone] ?07/08/2021 10:31 AM ? ?

## 2021-07-08 NOTE — Progress Notes (Signed)
Pt was informed of HD session later tonight. He refused due to the late time of day and requests to wait until tomorrow. HD RN called to inform of pt refusal. ?

## 2021-07-08 NOTE — Hospital Course (Signed)
The patient is a 61 year old with history of ESRD on hemodialysis Monday Wednesday Friday, anemia of chronic disease with baseline hemoglobin 7-8, multiple AV malformations GI tract, recent left below-knee amputation and currently at a skilled nursing facility brought to emergency room from dialysis center after he had a large volume fresh rectal bleed after completing dialysis on 3/8. ? ?Patient was mainly complaining of right-sided chest pain, lot of pain on his left below-knee amputation stump and also on the right leg.  Denied any nausea vomiting or abdominal pain or distention. Seen by vascular surgery on arrival.  CT angiogram lower extremities without evidence of acute blockages. ? ?Patient underwent an angiogram of the right lower extremity with third order cannulation and he also underwent superficial femoral artery angioplasty and stenting.  Postoperatively still complaining of some leg pain.  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  They are recommending continue PPIs and since he is stable they are signing off the case.  Patient is getting dialyzed and recommendations to keep hemoglobin greater than 8. ?

## 2021-07-08 NOTE — Progress Notes (Signed)
PROGRESS NOTE    Daniel [name]  NID:[id number] DOB: 01-14-1961 DOA: 07/03/2021 PCP: [name], NP   Brief Narrative:  The patient is a 61 year old with history of ESRD on hemodialysis Monday Wednesday Friday, anemia of chronic disease with baseline hemoglobin 7-8, multiple AV malformations GI tract, recent left below-knee amputation and currently at a skilled nursing facility brought to emergency room from dialysis center after he had a large volume fresh rectal bleed after completing dialysis on 3/8.  Patient was mainly complaining of right-sided chest pain, lot of pain on his left below-knee amputation stump and also on the right leg.  Denied any nausea vomiting or abdominal pain or distention. Seen by vascular surgery on arrival.  CT angiogram lower extremities without evidence of acute blockages.  Patient underwent an angiogram of the right lower extremity with third order cannulation and he also underwent superficial femoral artery angioplasty and stenting.  Postoperatively still complaining of some leg pain.  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  They are recommending continue PPIs and since he is stable they are signing off the case.  Patient is getting dialyzed and recommendations to keep hemoglobin greater than 8.    Assessment and Plan: Acute lower GI bleeding, acute on chronic anemia of blood loss:  -Known previous AVMs. Patient had multiple fresh and maroon-colored stool . -Hemoglobin down to 7-1 unit PRBC transfusion with appropriate response.  Hemoglobin 8.5 yesterday and today it is 8.3/26.0. -No clinical evidence of ongoing bleeding, however patient has not have a bowel movement yet.  GI evaluated and felt that he is stable and recommending PPIs and have signed off the case -Currently  hemodynamically stable. -Upper GI endoscopy with nonbleeding duodenal ulcer.  Remains on PPI. Underwent flex sig, found fresh bleeding around the proximal sigmoid colon.  Full colonoscopy not done. Remained stable without further bleeding, CT angiogram suggested if any further bleeding. -CT angiogram for the lower extremities also showed pooling of blood in the rectal area. -Patient is not on any antiplatelets or antithrombotics. Dialysis patient with significant cardiovascular comorbidities, will keep hemoglobin more than 8.  Getting IV iron  -We will need to monitor for any further bleeding before transition into a skilled nursing facility. -Hemoglobin/hematocrit is now relatively stable on last check was 8.3/26.0 and MCV of 107.4 -Continue monitor carefully and repeat CBC in a.m. -PT OT recommending SNF   Right-sided chest pain:  -Complaint of severe pleuritic chest pain, CT angiogram of the chest negative for pulmonary embolism.  Musculoskeletal pain. -Lidocaine patch and Percocet.  Improving.   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  Discussed case with Dr. Kipp Brood, CT CS.  Advised outpatient follow-up.  Referral sent to [facility] health CT CS office.   Severe peripheral vascular disease status post left below-knee amputation now with Right Leg and Foot Pain -Seen by vascular surgery.  Symptomatic treatment suggested. -CT angiogram without any evidence of acute arterial compromise.   -Left lower BKA done and he had some slight wound dehiscence so we will call vascular surgery to further reevaluate and wound care -Patient continues to complain of pain on the right foot.   -Angiogram done today and he had an angioplasty and stenting. -Vascular surgery recommending aspirin 81 mg p.o. daily indefinitely  as well as Plavix 75 mg p.o. daily for 12 months and a high intensity statin along with a follow-up with vascular surgery within 1 month  with ABIs and a right lower extremity duplex   Essential hypertension: -Blood pressure is stable.  Home medications resumed.   ESRD on hemodialysis: -Getting hemodialysis on schedule.  Next 3/13 after his angiogram -Patient's BUNs/creatinine now 27/8.76 -Continue further care per nephrology  Obesity -Complicates overall prognosis and care -Estimated body mass index is 37.39 kg/m as calculated from the following:   Height as of this encounter: '5\' 11"'$  (1.803 m).   Weight as of this encounter: 121.6 kg.  -Weight Loss and Dietary Counseling given    DVT prophylaxis: heparin injection 5,000 Units Start: 07/08/21 2200 SCD's Start: 07/08/21 1106 SCDs Start: 07/03/21 2110    Code Status: Full Code Family Communication: No family currently at bedside  Disposition Plan:  Level of care: Progressive Status is: Inpatient Remains inpatient appropriate because: Needs further vascular clearance and nephrology clearance and anticipate discharge to SNF once bed is available   Consultants:  Vascular surgery Gastroenterology Nephrology  Procedures:  Flexible sigmoidoscopy and right lower extremity angiogram with angioplasty and stenting  Antimicrobials:  Anti-infectives (From admission, onward)    None        Subjective: Seen and examined at bedside after his angiogram is still complaining of some right foot pain.  No nausea or vomiting.  He states he is doing okay.  Denies any lightheadedness or dizziness but states that he has had black stools recently.  No other concerns or points this time.  Objective: Vitals:   07/08/21 1145 07/08/21 1203 07/08/21 1705 07/08/21 1706  BP: (!) 152/83 133/72  128/74  Pulse: 80 87 78   Resp:   18   Temp:   97.7 F (36.5 C)   TempSrc:   Oral   SpO2: 95% 97% 97%   Weight:      Height:        Intake/Output Summary (Last 24 hours) at 07/08/2021 1928 Last data filed at 07/08/2021 1413 Gross per 24 hour  Intake 800 ml  Output 350 ml  Net 450  ml   Filed Weights   07/05/21 1042 07/06/21 0450 07/08/21 0506  Weight: 117 kg 117 kg 121.6 kg   Examination: Physical Exam:  Constitutional: WN/WD chronically ill-appearing obese African-American male currently in no acute distress appears slightly uncomfortable Respiratory: Diminished to auscultation bilaterally, no wheezing, rales, rhonchi or crackles. Normal respiratory effort and patient is not tachypenic. No accessory muscle use.  Unlabored breathing Cardiovascular: RRR, no murmurs / rubs / gallops. S1 and S2 auscultated.  Abdomen: Soft, non-tender, distended secondary body habitus. Bowel sounds positive.  GU: Deferred. Musculoskeletal: No clubbing / cyanosis of digits/nails.  Has a left BKA with a wound Skin: No rashes, lesions, ulcers limited skin evaluation. No induration; Warm and dry.  Neurologic: CN 2-12 grossly intact with no focal deficits.   Data Reviewed: I have personally reviewed following labs and imaging studies  CBC: Recent Labs  Lab 07/03/21 1700 07/03/21 2144 07/04/21 0143 07/04/21 0906 07/05/21 1729 07/06/21 0551 07/06/21 1631 07/07/21 0432 07/08/21 0301  WBC 4.4  --  5.5  --   --   --   --   --  5.6  NEUTROABS 2.2  --  2.6  --   --   --   --   --   --   HGB 8.0*   < > 7.9*   < >  8.7* 7.9* 8.0* 8.5* 8.3*  HCT 25.3*   < > 24.0*   < > 26.0* 24.6* 25.2* 26.8* 26.0*  MCV 108.1*  --  104.8*  --   --   --   --   --  107.4*  PLT 173  --  161  --   --   --   --   --  152   < > = values in this interval not displayed.   Basic Metabolic Panel: Recent Labs  Lab 07/03/21 1700 07/04/21 0143 07/08/21 0301  NA 140 141 137  K 4.9 3.6 4.3  CL 95* 95* 99  CO2 '27 29 26  '$ GLUCOSE 94 86 110*  BUN 11 12 27*  CREATININE 4.25* 5.13* 8.76*  CALCIUM 8.8* 9.5 9.8  MG  --  2.0  --    GFR: Estimated Creatinine Clearance: 11.7 mL/min (A) (by C-G formula based on SCr of 8.76 mg/dL (H)). Liver Function Tests: Recent Labs  Lab 07/03/21 1700 07/04/21 0143  AST 82*  51*  ALT 16 16  ALKPHOS 64 59  BILITOT 2.0* 0.3  PROT 7.7 8.1  ALBUMIN 2.6* 2.5*   No results for input(s): LIPASE, AMYLASE in the last 168 hours. No results for input(s): AMMONIA in the last 168 hours. Coagulation Profile: Recent Labs  Lab 07/03/21 1700 07/04/21 0554  INR 1.1 1.1   Cardiac Enzymes: No results for input(s): CKTOTAL, CKMB, CKMBINDEX, TROPONINI in the last 168 hours. BNP (last 3 results) No results for input(s): PROBNP in the last 8760 hours. HbA1C: No results for input(s): HGBA1C in the last 72 hours. CBG: Recent Labs  Lab 07/05/21 1309  GLUCAP 79   Lipid Profile: No results for input(s): CHOL, HDL, LDLCALC, TRIG, CHOLHDL, LDLDIRECT in the last 72 hours. Thyroid Function Tests: No results for input(s): TSH, T4TOTAL, FREET4, T3FREE, THYROIDAB in the last 72 hours. Anemia Panel: No results for input(s): VITAMINB12, FOLATE, FERRITIN, TIBC, IRON, RETICCTPCT in the last 72 hours. Sepsis Labs: No results for input(s): PROCALCITON, LATICACIDVEN in the last 168 hours.  Recent Results (from the past 240 hour(s))  Resp Panel by RT-PCR (Flu A&B, Covid) Nasopharyngeal Swab     Status: None   Collection Time: 07/03/21  9:07 PM   Specimen: Nasopharyngeal Swab; Nasopharyngeal(NP) swabs in vial transport medium  Result Value Ref Range Status   SARS Coronavirus 2 by RT PCR NEGATIVE NEGATIVE Final    Comment: (NOTE) SARS-CoV-2 target nucleic acids are NOT DETECTED.  The SARS-CoV-2 RNA is generally detectable in upper respiratory specimens during the acute phase of infection. The lowest concentration of SARS-CoV-2 viral copies this assay can detect is 138 copies/mL. A negative result does not preclude SARS-Cov-2 infection and should not be used as the sole basis for treatment or other patient management decisions. A negative result may occur with  improper specimen collection/handling, submission of specimen other than nasopharyngeal swab, presence of viral  mutation(s) within the areas targeted by this assay, and inadequate number of viral copies(<138 copies/mL). A negative result must be combined with clinical observations, patient history, and epidemiological information. The expected result is Negative.  Fact Sheet for Patients:  EntrepreneurPulse.com.au  Fact Sheet for Healthcare Providers:  IncredibleEmployment.be  This test is no t yet approved or cleared by the [place] FDA and  has been authorized for detection and/or diagnosis of SARS-CoV-2 by FDA under an Emergency Use Authorization (EUA). This EUA will remain  in effect (meaning this test can be used) for the duration  of the COVID-19 declaration under Section 564(b)(1) of the Act, 21 U.S.C.section 360bbb-3(b)(1), unless the authorization is terminated  or revoked sooner.       Influenza A by PCR NEGATIVE NEGATIVE Final   Influenza B by PCR NEGATIVE NEGATIVE Final    Comment: (NOTE) The Xpert Xpress SARS-CoV-2/FLU/RSV plus assay is intended as an aid in the diagnosis of influenza from Nasopharyngeal swab specimens and should not be used as a sole basis for treatment. Nasal washings and aspirates are unacceptable for Xpert Xpress SARS-CoV-2/FLU/RSV testing.  Fact Sheet for Patients: EntrepreneurPulse.com.au  Fact Sheet for Healthcare Providers: IncredibleEmployment.be  This test is not yet approved or cleared by the [place] FDA and has been authorized for detection and/or diagnosis of SARS-CoV-2 by FDA under an Emergency Use Authorization (EUA). This EUA will remain in effect (meaning this test can be used) for the duration of the COVID-19 declaration under Section 564(b)(1) of the Act, 21 U.S.C. section 360bbb-3(b)(1), unless the authorization is terminated or revoked.  Performed at [facility], [place] [address] [place], [place] [address]      Radiology Studies: PERIPHERAL  VASCULAR CATHETERIZATION  Result Date: 07/08/2021 DATE OF SERVICE: 07/08/2021  PATIENT:  Daniel [name]  61 y.o. 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  PATIENT DISPOSITION:  PACU - hemodynamically stable.  Delay start of Pharmacological VTE agent (>24hrs) due to surgical blood loss or risk of bleeding: no  INDICATION FOR PROCEDURE: RiDAMONTAE LOPPNOWs a [age].o. male with ischemic rest pain of the right foot with known peripheral arterial disease. After careful discussion of risks, benefits, and alternatives the patient was offered angiography with intervention. The patient understood and wished to proceed.  OPERATIVE FINDINGS: Terminal aorta and iliac arteries: Widely patent and tortuous.  Right lower extremity: Common femoral artery: Heavily calcified, but widely patent Profunda femoris artery: Heavily calcified, but widely patent Superficial femoral artery: Heavily calcified. Focal severe stenosis in the mid SFA.  Reconstitution of the SFA medially distal to this critical stenosis. Popliteal artery: Heavily calcified, but widely patent Anterior tibial artery: occludes distal to the origin. Tibioperoneal trunk: Heavily calcified, but widely patent Peroneal artery: Heavily calcified. Occludes in distal ankle. Posterior tibial artery: Dominant tibial vessel. Courses to the foot and fills the pedal arch. Pedal circulation: disadvantaged  DESCRIPTION OF  PROCEDURE: After identification of the patient in the pre-operative holding area, the patient was transferred to the operating room. The patient was positioned supine on the operating room table. Anesthesia was induced. The groins was prepped and draped in standard fashion. A surgical pause was performed confirming correct patient, procedure, and operative location.  The left groin was anesthetized with subcutaneous injection of 1% lidocaine. Using ultrasound guidance, the left common femoral artery was accessed with micropuncture technique. Fluoroscopy was used to confirm cannulation over the femoral head. The 58F sheath was upsized to 59F.  A Benson wire was advanced into the distal aorta. Over the wire an omni flush catheter  was advanced to the level of L2. Aortogram was performed - see above for details.  The right common iliac artery was selected with a Benson guidewire. The wire was advanced into the common femoral artery. Over the wire the omni flush catheter was advanced into the external iliac artery. Selective angiography was performed - see above for details.  The decision was made to intervene. The patient was heparinized with 10,000 units of heparin. The 20F sheath was exchanged for a 70F x 45cm sheath. Selective angiography of the left lower extremity was performed prior to intervention.  The lesions were treated with: Right superficial femoral artery angioplasty and stenting (7x66m Eluvia)  Completion angiography revealed: Resolution of the stenosis  A mynx was used to close the arteriotomy. Hemostasis was excellent upon completion.  Conscious sedation was administered with the use of IV fentanyl and midazolam under continuous physician and nurse monitoring.  Heart rate, blood pressure, and oxygen saturation were continuously monitored.  Total sedation time was 42 minutes  Upon completion of the case instrument and sharps counts were confirmed correct. The patient was transferred to the PACU in good  condition. I was present for all portions of the procedure.  PLAN: Good techincal result. ASA '81mg'$  PO QD indefinitely. Plavix '75mg'$  PO QD x 12 months. High intensity statin therapy indefinitely. Follow up with [facility] in 1 month with ABI and RLE duplex.  TYevonne Aline HStanford Breed MD [facility] CenterPhone Number: ([phone]/13/2023 10:31 AM   VAS [place]ABI WITH/WO TBI  Result Date: 07/07/2021  LOWER EXTREMITY DOPPLER STUDY Patient Name:  RDEVIN [name] Date of Exam:   07/07/2021 Medical Rec #: [id number]          Accession #:    [id number] of Birth: [birth year]          Patient Gender: M Patient Age:   89years [facility]      VAS [place]ABI WITH/WO TBI Referring Phys: BServando Snare--------------------------------------------------------------------------------  Indications: Rest pain, and peripheral artery disease. High Risk Factors: Hypertension. Other Factors: ESRD, on dialysis.  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 Comparison Study: Prior ABI done 08/29/2020 Performing Technologist: KSharion DoveRVS  Examination Guidelines: A complete evaluation includes at minimum, Doppler waveform signals and systolic blood pressure reading at the level of bilateral brachial, anterior tibial, and posterior tibial arteries, when vessel segments are accessible. Bilateral testing is considered an integral part of a complete examination. Photoelectric Plethysmograph (PPG) waveforms and toe systolic pressure readings are included as required and additional duplex testing as needed. Limited examinations for reoccurring indications may be performed as noted.  ABI Findings: +---------+------------------+-----+-----------+--------+  Right     Rt Pressure (mmHg) Index Waveform    Comment   +---------+------------------+-----+-----------+--------+  Brachial  128                      multiphasic           +---------+------------------+-----+-----------+--------+   PTA       254                1.98  multiphasic           +---------+------------------+-----+-----------+--------+  DP        254                1.98  multiphasic           +---------+------------------+-----+-----------+--------+  Great Toe  62                 0.48                        +---------+------------------+-----+-----------+--------+ +---------+------------------+-----+--------+-------------------+  Left      Lt Pressure (mmHg) Index Waveform Comment              +---------+------------------+-----+--------+-------------------+  Brachial                                    restricted dialysis  +---------+------------------+-----+--------+-------------------+  PTA                                         BKA                  +---------+------------------+-----+--------+-------------------+  DP                                          BKA                  +---------+------------------+-----+--------+-------------------+  Great Toe                                   BKA                  +---------+------------------+-----+--------+-------------------+ +-------+-------------+-----------+-------------+------------+  ABI/TBI Today's ABI   Today's TBI Previous ABI  Previous TBI  +-------+-------------+-----------+-------------+------------+  Right   1.98 non comp 0.48        1.39 non comp 0.47          +-------+-------------+-----------+-------------+------------+  Left    BKA           BKA         BKA           BKA           +-------+-------------+-----------+-------------+------------+ Right ABIs appear essentially unchanged compared to prior study on 08/29/2020. Right TBIs appear essentially unchanged compared to prior study on 08/29/2020.  Summary: Right: Resting right ankle-brachial index indicates noncompressible right lower extremity arteries. The right toe-brachial index is abnormal. Left: BKA.  *See table(s) above for measurements and observations.  Electronically signed by Servando Snare MD on 07/07/2021 at  4:18:48 PM.    Final      Scheduled Meds:  sodium chloride   Intravenous Once   amLODipine  10 mg Oral QHS   aspirin EC  81 mg Oral Daily   atorvastatin  40 mg Oral Daily   Chlorhexidine Gluconate Cloth  6 each Topical Q0600   cloNIDine  0.1 mg Oral Q M,W,F-2000   cloNIDine  0.1 mg Oral 2 times per day on Sun Tue Thu Sat   [START ON 07/09/2021] clopidogrel  75 mg Oral Daily   gabapentin  100 mg Oral TID   heparin  5,000 Units Subcutaneous Q8H   hydrocortisone cream   Topical BID   lidocaine  1 patch Transdermal Q24H   nicotine  14 mg Transdermal Daily   pantoprazole  40 mg Oral BID   polyethylene glycol  17 g Oral Daily   sodium chloride flush  3  mL Intravenous Q12H   Continuous Infusions:  sodium chloride     ferric gluconate (FERRLECIT) IVPB 125 mg (07/08/21 1702)     LOS: 4 days   Raiford Noble, DO [facility] Available via Epic secure chat 7am-7pm After these hours, please refer to coverage provider listed on [email or web address] 07/08/2021, 7:28 PM

## 2021-07-08 NOTE — Progress Notes (Signed)
Occupational Therapy Treatment ?Patient Details ?Name: Daniel [name] ?MRN: [id number] ?DOB: February 12, 1961 ?Today's Date: 07/08/2021 ? ? ?History of present illness Pt is a 61 y/o male presenting on 3/8 from SNF for rectal bleeding, R sided chest pain. Found with acute GI bleed. PMH includes: anemia, DM, ESRD on HD (MWF), HTN, ICD, PAD s/p L BKA. ?  ?OT comments ? Patient continues to make steady progress towards goals in skilled OT session. Patient's session encompassed  bed mobility and ADLs at bed level as patient was on bedpan upon arrival. Patient is able to bridge with R leg and demonstrates excellent bed mobility, therefore continued to encourage patient to use BSC to toilet. Patient politely declining OOB due to pending procedure in the AM and stating he has already been up in the chair earlier. Discharge remains appropriate, therapy will continue to follow.   ? ?Recommendations for follow up therapy are one component of a multi-disciplinary discharge planning process, led by the attending physician.  Recommendations may be updated based on patient status, additional functional criteria and insurance authorization. ?   ?Follow Up Recommendations ? Skilled nursing-short term rehab (<3 hours/day) (return to SNF)  ?  ?Assistance Recommended at Discharge Frequent or constant Supervision/Assistance  ?Patient can return home with the following ? A little help with walking and/or transfers;A little help with bathing/dressing/bathroom;Assistance with cooking/housework;Help with stairs or ramp for entrance;Assist for transportation ?  ?Equipment Recommendations ? Other (comment) (defer to next level of care)  ?  ?Recommendations for Other Services   ? ?  ?Precautions / Restrictions Precautions ?Precautions: Fall ?Precaution Comments: recent L BKA (non healing) ?Restrictions ?Weight Bearing Restrictions: No  ? ? ?  ? ?Mobility Bed Mobility ?Overal bed mobility: Modified Independent ?  ?  ?  ?  ?  ?  ?  ?  ? ?Transfers ?   ?  ?  ?  ?  ?  ?  ?  ?  ?  ?  ?  ?Balance   ?  ?  ?  ?  ?  ?  ?  ?  ?  ?  ?  ?  ?  ?  ?  ?  ?  ?  ?   ? ?ADL either performed or assessed with clinical judgement  ? ?ADL Overall ADL's : Needs assistance/impaired ?  ?  ?Grooming: Set up;Sitting;Wash/dry hands;Wash/dry face;Oral care ?  ?  ?  ?  ?  ?  ?  ?  ?  ?  ?Toilet Transfer Details (indicate cue type and reason): patient on bedpan at beginning of session, able to bridge with one leg and roll, encouraged to use [facility] and patient stating "well staff just put me on here" ?Toileting- Clothing Manipulation and Hygiene: Total assistance ?Toileting - Clothing Manipulation Details (indicate cue type and reason): due to being on bedpan, is capable of completing at min A ?  ?  ?Functional mobility during ADLs: Minimal assistance;Rolling walker (2 wheels);Cueing for safety ?General ADL Comments: patient session including bed level ADLs (recieved on bed pan) had already been up in chair earlier in AM ?  ? ?Extremity/Trunk Assessment   ?  ?  ?  ?  ?  ? ?Vision   ?  ?  ?Perception   ?  ?Praxis   ?  ? ?Cognition Arousal/Alertness: Awake/alert ?Behavior During Therapy: [facility] for tasks assessed/performed ?Overall Cognitive Status: Within Functional Limits for tasks assessed ?  ?  ?  ?  ?  ?  ?  ?  ?  ?  ?  ?  ?  ?  ?  ?  ?  ?  ?  ?   ?  Exercises General Exercises - Lower Extremity ?Quad Sets: 10 reps, Supine, Left ? ?  ?Shoulder Instructions   ? ? ?  ?General Comments    ? ? ?Pertinent Vitals/ Pain       Pain Assessment ?Pain Assessment: No/denies pain ? ?Home Living   ?  ?  ?  ?  ?  ?  ?  ?  ?  ?  ?  ?  ?  ?  ?  ?  ?  ?  ? ?  ?Prior Functioning/Environment    ?  ?  ?  ?   ? ?Frequency ? Min 2X/week  ? ? ? ? ?  ?Progress Toward Goals ? ?OT Goals(current goals can now be found in the care plan section) ? Progress towards OT goals: Progressing toward goals ? ?Acute Rehab OT Goals ?Patient Stated Goal: to get this procedure over with ?OT Goal Formulation: With patient ?Time For Goal  Achievement: 07/20/21 ?Potential to Achieve Goals: Good  ?Plan Discharge plan remains appropriate   ? ?Co-evaluation ? ? ?   ?  ?  ?  ?  ? ?  ?AM-PAC OT "6 Clicks" Daily Activity     ?Outcome Measure ? ? Help from another person eating meals?: None ?Help from another person taking care of personal grooming?: A Little ?Help from another person toileting, which includes using toliet, bedpan, or urinal?: A Little ?Help from another person bathing (including washing, rinsing, drying)?: A Little ?Help from another person to put on and taking off regular upper body clothing?: A Little ?Help from another person to put on and taking off regular lower body clothing?: A Little ?6 Click Score: 19 ? ?  ?End of Session   ? ?OT Visit Diagnosis: Other abnormalities of gait and mobility (R26.89);Muscle weakness (generalized) (M62.81);Pain ?Pain - Right/Left: Left ?Pain - part of body: Leg ?  ?Activity Tolerance Patient tolerated treatment well ?  ?Patient Left in bed;with call bell/phone within reach;with nursing/sitter in room ?  ?Nurse Communication Mobility status ?  ? ?   ? ?Time: 3664-4034 ?OT Time Calculation (min): 17 min ? ?Charges: OT General Charges ?$OT Visit: 1 Visit ?OT Treatments ?$Self Care/Home Management : 8-22 mins ? ?Corinne Ports E. Sydney Hasten, OTR/L ?Acute Rehabilitation Services ?[phone] ?[phone]  ? ?Corinne Ports Anniebelle Devore ?07/08/2021, 11:52 AM ?

## 2021-07-08 NOTE — Progress Notes (Signed)
Patient arrive from cath lab to 4e18 patient with left groin incision, clean dry and intact no hematoma or bleeding noted at this time. Vital signs obtained and patient placed back on monitor. Call bell within reach.  Hibba Schram, Bettina Gavia ?RN  ?

## 2021-07-08 NOTE — Progress Notes (Signed)
[facility] [place] ?PROGRESS NOTE ? ?ASSESSMENT / PLAN: ?Daniel [name] is a 61 y.o. 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 ? ?Patient counseled patients with chronic limb threatening ischemia have an annual risk of cardiovascular mortality of 25% and a high risk of amputation.  ? ?Recommend the following which can slow the progression of atherosclerosis and reduce the risk of major adverse cardiac / limb events:  ?Complete cessation from all tobacco products. ?Blood glucose control with goal A1c < 7%. ?Blood pressure control with goal blood pressure < 140/90 mmHg. ?Lipid reduction therapy with goal LDL-C <100 mg/dL (<70 if symptomatic from PAD).  ?Aspirin '81mg'$  PO QD.  ?Clopidogrel '75mg'$  PO QD. ?Atorvastatin 40-'80mg'$  PO QD (or other "high intensity" statin therapy). ? ?Plan right lower extremity angiogram with possible intervention via left common femoral approach in cath lab today.  ? ?SUBJECTIVE: ?Reports severe pain in right foot. ? ?OBJECTIVE: ?BP 132/78 (BP Location: Right Arm)   Pulse 78   Temp 98.1 ?F (36.7 ?C) (Oral)   Resp 18   Ht '5\' 11"'$  (1.803 m)   Wt 121.6 kg   SpO2 97%   BMI 37.39 kg/m?  ? ?Intake/Output Summary (Last 24 hours) at 07/08/2021 1027 ?Last data filed at 07/07/2021 2200 ?Gross per 24 hour  ?Intake 120 ml  ?Output 150 ml  ?Net -30 ml  ?  ?Constitutional: chronically ill appearing. no acute distress. ?Cardiac: RRR. ?Pulmonary: unlabored ?Abdomen: soft ?Vascular: no palpable pedal pulses ? ?CBC Latest Ref Rng & Units 07/08/2021 07/07/2021 07/06/2021  ?WBC 4.0 - 10.5 K/uL 5.6 - -  ?Hemoglobin 13.0 - 17.0 g/dL 8.3(L) 8.5(L) 8.0(L)  ?Hematocrit 39.0 - 52.0 % 26.0(L) 26.8(L) 25.2(L)  ?Platelets 150 - 400 K/uL 152 - -  ?  ? ?CMP Latest Ref Rng & Units 07/08/2021 07/04/2021 07/03/2021  ?Glucose 70 - 99 mg/dL 110(H) 86 94  ?BUN 8 - 23 mg/dL 27(H) 12 11  ?Creatinine 0.61 - 1.24 mg/dL 8.76(H) 5.13(H) 4.25(H)  ?Sodium  135 - 145 mmol/L 137 141 140  ?Potassium 3.5 - 5.1 mmol/L 4.3 3.6 4.9  ?Chloride 98 - 111 mmol/L 99 95(L) 95(L)  ?CO2 22 - 32 mmol/L '26 29 27  '$ ?Calcium 8.9 - 10.3 mg/dL 9.8 9.5 8.8(L)  ?Total Protein 6.5 - 8.1 g/dL - 8.1 7.7  ?Total Bilirubin 0.3 - 1.2 mg/dL - 0.3 2.0(H)  ?Alkaline Phos 38 - 126 U/L - 59 64  ?AST 15 - 41 U/L - 51(H) 82(H)  ?ALT 0 - 44 U/L - 16 16  ? ? ?Estimated Creatinine Clearance: 11.7 mL/min (A) (by C-G formula based on SCr of 8.76 mg/dL (H)). ? ?Yevonne Aline. Stanford Breed, MD ?[facility] [place] ?Office Phone Number: [phone] ?07/08/2021 10:27 AM ? ? ? ?

## 2021-07-09 ENCOUNTER — Other Ambulatory Visit: Payer: Self-pay | Admitting: Thoracic Surgery (Cardiothoracic Vascular Surgery)

## 2021-07-09 ENCOUNTER — Encounter (HOSPITAL_COMMUNITY): Payer: Self-pay | Admitting: Vascular Surgery

## 2021-07-09 ENCOUNTER — Ambulatory Visit: Payer: Medicare Other

## 2021-07-09 DIAGNOSIS — R911 Solitary pulmonary nodule: Secondary | ICD-10-CM

## 2021-07-09 DIAGNOSIS — K922 Gastrointestinal hemorrhage, unspecified: Secondary | ICD-10-CM | POA: Diagnosis not present

## 2021-07-09 DIAGNOSIS — I10 Essential (primary) hypertension: Secondary | ICD-10-CM | POA: Diagnosis not present

## 2021-07-09 DIAGNOSIS — K2100 Gastro-esophageal reflux disease with esophagitis, without bleeding: Secondary | ICD-10-CM | POA: Diagnosis not present

## 2021-07-09 DIAGNOSIS — N186 End stage renal disease: Secondary | ICD-10-CM | POA: Diagnosis not present

## 2021-07-09 LAB — COMPREHENSIVE METABOLIC PANEL
ALT: 11 U/L (ref 0–44)
AST: 33 U/L (ref 15–41)
Albumin: 2.5 g/dL — ABNORMAL LOW (ref 3.5–5.0)
Alkaline Phosphatase: 64 U/L (ref 38–126)
Anion gap: 13 (ref 5–15)
BUN: 31 mg/dL — ABNORMAL HIGH (ref 8–23)
CO2: 25 mmol/L (ref 22–32)
Calcium: 9.7 mg/dL (ref 8.9–10.3)
Chloride: 97 mmol/L — ABNORMAL LOW (ref 98–111)
Creatinine, Ser: 10.07 mg/dL — ABNORMAL HIGH (ref 0.61–1.24)
GFR, Estimated: 5 mL/min — ABNORMAL LOW (ref >60)
Glucose, Bld: 100 mg/dL — ABNORMAL HIGH (ref 70–99)
Potassium: 4.3 mmol/L (ref 3.5–5.1)
Sodium: 135 mmol/L (ref 135–145)
Total Bilirubin: 0.4 mg/dL (ref 0.3–1.2)
Total Protein: 7.8 g/dL (ref 6.5–8.1)

## 2021-07-09 LAB — CBC WITH DIFFERENTIAL/PLATELET
Abs Immature Granulocytes: 0.04 10*3/uL (ref 0.00–0.07)
Basophils Absolute: 0 10*3/uL (ref 0.0–0.1)
Basophils Relative: 1 %
Eosinophils Absolute: 0.3 10*3/uL (ref 0.0–0.5)
Eosinophils Relative: 5 %
HCT: 25.7 % — ABNORMAL LOW (ref 39.0–52.0)
Hemoglobin: 8 g/dL — ABNORMAL LOW (ref 13.0–17.0)
Immature Granulocytes: 1 %
Lymphocytes Relative: 28 %
Lymphs Abs: 1.6 10*3/uL (ref 0.7–4.0)
MCH: 33.5 pg (ref 26.0–34.0)
MCHC: 31.1 g/dL (ref 30.0–36.0)
MCV: 107.5 fL — ABNORMAL HIGH (ref 80.0–100.0)
Monocytes Absolute: 0.7 10*3/uL (ref 0.1–1.0)
Monocytes Relative: 11 %
Neutro Abs: 3.2 10*3/uL (ref 1.7–7.7)
Neutrophils Relative %: 54 %
Platelets: 155 10*3/uL (ref 150–400)
RBC: 2.39 MIL/uL — ABNORMAL LOW (ref 4.22–5.81)
RDW: 19.9 % — ABNORMAL HIGH (ref 11.5–15.5)
WBC: 5.9 10*3/uL (ref 4.0–10.5)
nRBC: 1.2 % — ABNORMAL HIGH (ref 0.0–0.2)

## 2021-07-09 LAB — MAGNESIUM: Magnesium: 2.2 mg/dL (ref 1.7–2.4)

## 2021-07-09 LAB — HEPATITIS B SURFACE ANTIGEN: Hepatitis B Surface Ag: NONREACTIVE

## 2021-07-09 LAB — HEPATITIS B CORE ANTIBODY, TOTAL: Hep B Core Total Ab: REACTIVE — AB

## 2021-07-09 LAB — PHOSPHORUS: Phosphorus: 5.9 mg/dL — ABNORMAL HIGH (ref 2.5–4.6)

## 2021-07-09 NOTE — Consult Note (Signed)
WOC Nurse Consult Note: ?Patient receiving care in [place] ?Reason for Consult: LBKA site wound ?Wound type: Full thickness wound at [facility] site ?Pressure Injury NA ?Measurement: 1.5 cm x 9 cm x 0.2 cm ?Wound bed: 95% pink with 5% yellow fibrinous tissue.  ?Drainage (amount, consistency, odor) None on dressing. ?Periwound: intact ?Dressing procedure/placement/frequency: ?Clean the wound on the LBKA with NS. Pat dry and apply a piece of Xeroform gauze over the wound, cover with 4 x 4 and secure with heel foam dressing. Patient may want the Ace wrap over this. Change Xeroform daily.  ? ?Monitor the wound area(s) for worsening of condition such as: ?Signs/symptoms of infection, increase in size, development of or worsening of odor, ?development of pain, or increased pain at the affected locations.   ?Notify the medical team if any of these develop. ? ?Thank you for the consult. [place] nurse will not follow at this time.   ?Please re-consult the [place] team if needed. ? ?Cathlean Marseilles. Tamala Julian, MSN, RN, CMSRN, AGCNS, WTA ?Wound Treatment Associate ?Pager [phone]   ? ?Cathlean Marseilles Tamala Julian, MSN, RN, CMSRN, AGCNS, WTA ?Wound Treatment Associate ?Pager [phone]  ? ? ?  ?

## 2021-07-09 NOTE — Discharge Instructions (Signed)
  [facility] [place]  Discharge Instructions  Lower Extremity Angiogram; Angioplasty/Stenting  Please refer to the following instructions for your post-procedure care. Your surgeon or physician assistant will discuss any changes with you.  Activity  Avoid lifting more than 8 pounds (1 gallons of milk) for 5 days after your procedure. You may walk as much as you can tolerate. It's OK to drive after 72 hours.  Bathing/Showering  You may shower the day after your procedure. If you have a bandage, you may remove it at 24- 48 hours. Clean your incision site with mild soap and water. Pat the area dry with a clean towel.  Diet  Resume your pre-procedure diet. There are no special food restrictions following this procedure. All patients with peripheral vascular disease should follow a low fat/low cholesterol diet. In order to heal from your surgery, it is CRITICAL to get adequate nutrition. Your body requires vitamins, minerals, and protein. Vegetables are the best source of vitamins and minerals. Vegetables also provide the perfect balance of protein. Processed food has little nutritional value, so try to avoid this.  Medications  Resume taking all of your medications unless your doctor tells you not to. If your incision is causing pain, you may take over-the-counter pain relievers such as acetaminophen (Tylenol)  Follow Up  Follow up will be arranged at the time of your procedure. You may have an office visit scheduled or may be scheduled for surgery. Ask your surgeon if you have any questions.  Please call us immediately for any of the following conditions: .Severe or worsening pain your legs or feet at rest or with walking. .Increased pain, redness, drainage at your groin puncture site. .Fever of 101 degrees or higher. .If you have any mild or slow bleeding from your puncture site: lie down, apply firm constant pressure over the area with a piece of gauze or a  clean wash cloth for 30 minutes- no peeking!, call 911 right away if you are still bleeding after 30 minutes, or if the bleeding is heavy and unmanageable.  Reduce your risk factors of vascular disease:  . Stop smoking. If you would like help call [facility] at 1-800-QUIT-NOW (1-800-784-8669) or [place] at 336-586-4000. . Manage your cholesterol . Maintain a desired weight . Control your diabetes . Keep your blood pressure down .  If you have any questions, please call the office at [phone] 

## 2021-07-09 NOTE — Progress Notes (Signed)
[facility] ?Progress Note ? ?Home meds include - norvasc, phoslo 2 ac, clonidine 0.1 mwf, neurontin 100 tid, lopressor '100mg'$  prn, MVI, prosouce plus, protonix, florastor, prns/ vits/ supps ? ?OP HD: MWF  ?  4h  450/500   119kg   2/2 bath  Hep none  LUA AVF ? - venofer '100mg'$  q HD, 7 left ? - mircera 225 q2, last 3/08 ? - hectorol 1 ug tiw IV ?  ?  ?Assessment/ Plan: ?GI bleed - hx of PUD and bleeding AVMs.  Enteroscopy 3/10 by GI showed non- bleeding AVM's. Flex sig showed fresh bleeding in prox sigmoid colon, colon full of blood.  Keep Hb > 8 given sig CV disease.  Hb up at 8.4 today. GI following.  ?R foot pain/ hx PAD/ hx L BKA - angiogram 3/13 with rt SFA PTA and stenting nad good perfusion per VVS. 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 TCTS saw, advised OP f/u.  ?ESRD- on HD MWF.  ?Seen on HD  ?4K bath 175/100 ?2L net UF ? ?Will plan on HD tomorrow to get back on schedule if he's still here. ? ?BP/ volume - cont BP medications, no vol excess, on RA, at dry wt  ?Anemia ckd - next esa due 3/22, cont IV Fe load. Transfuse prn per pmd.  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 Ca in range.  ?Dispo - will go back to SNF when ready ? ?Subjective: seen in dialysis; vomiting only last night. Feels better today. ? ?Vitals:  ? 07/09/21 0810 07/09/21 0839 07/09/21 0850 07/09/21 0900  ?BP: 138/78 (!) 151/95 (!) 187/87 (!) 175/100  ?Pulse: 78 82 79 73  ?Resp: '17 15 12 10  '$ ?Temp: 97.8 ?F (36.6 ?C) 97.9 ?F (36.6 ?C)    ?TempSrc: Oral Oral    ?SpO2: 95% 97%    ?Weight:  121.5 kg    ?Height:      ? ? ?Exam: ? alert, nad  ? no jvd ? Chest cta bilat ? Cor reg no RG ? Abd soft ntnd no ascites ? MS L BKA wrapped ?Ext no LE or UE edema ?Neuro is alert, Ox 3 , nf ? LUA AVF +bruit ? ?  ? ? ?Recent Labs  ?Lab 07/04/21 ?0143 07/04/21 ?0906 07/08/21 ?0301 07/09/21 ?0200  ?HGB 7.9*   < > 8.3* 8.0*  ?ALBUMIN 2.5*  --   --  2.5*  ?CALCIUM 9.5  --  9.8 9.7  ?PHOS  --   --   --  5.9*  ?CREATININE 5.13*  --  8.76* 10.07*  ?K 3.6  --  4.3  4.3  ? < > = values in this interval not displayed.  ? ?Inpatient medications: ? sodium chloride   Intravenous Once  ? amLODipine  10 mg Oral QHS  ? aspirin EC  81 mg Oral Daily  ? atorvastatin  40 mg Oral Daily  ? Chlorhexidine Gluconate Cloth  6 each Topical Q0600  ? cloNIDine  0.1 mg Oral Q M,W,F-2000  ? cloNIDine  0.1 mg Oral 2 times per day on Sun Tue Thu Sat  ? clopidogrel  75 mg Oral Daily  ? gabapentin  100 mg Oral TID  ? heparin  5,000 Units Subcutaneous Q8H  ? hydrocortisone cream   Topical BID  ? lidocaine  1 patch Transdermal Q24H  ? nicotine  14 mg Transdermal Daily  ? pantoprazole  40 mg Oral BID  ? polyethylene glycol  17 g Oral Daily  ?  sodium chloride flush  3 mL Intravenous Q12H  ? ? sodium chloride    ? ferric gluconate (FERRLECIT) IVPB Stopped (07/08/21 1802)  ? ?sodium chloride, acetaminophen **OR** acetaminophen, hydrALAZINE, hydrOXYzine, labetalol, oxyCODONE-acetaminophen, sodium chloride flush ? ? ? ? ? ? ?

## 2021-07-09 NOTE — Progress Notes (Signed)
PT Cancellation Note ? ?Patient Details ?Name: Daniel [name] ?MRN: [id number] ?DOB: Jul 30, 1960 ? ? ?Cancelled Treatment:    Reason Eval/Treat Not Completed: (P) Patient declined, no reason specified (Pt had HD in AM and refused when attempted at 2:40pm. Offered to reattempt at 4:30pm however pt reports "I will be sleeping, I need a nap".) Pt requests PT re-attempt tomorrow earlier in the day as he won't have HD tomorrow. ? ? ?[name] M Nadeem Romanoski ?07/09/2021, 2:43 PM ? ? ?

## 2021-07-09 NOTE — Progress Notes (Addendum)
?  Progress Note ? ? ? ?07/09/2021 ?7:47 AM ?1 Day Post-Op ? ?Subjective:  still having pain in right foot ? ? ?Vitals:  ? 07/08/21 2344 07/09/21 0310  ?BP: (!) 141/95 (!) 149/87  ?Pulse: 75 85  ?Resp: 17 15  ?Temp: 97.7 ?F (36.5 ?C) 97.6 ?F (36.4 ?C)  ?SpO2: 99% 96%  ? ?Physical Exam: ?Cardiac:  regular ?Lungs:  non labored ?Incisions:  left BKA wound still healing. Granulation tissue in wound bed. Wet to dry dressings applied ?Extremities:  RLE well perfused and warm. Doppler PT signal ?Abdomen:  obese, soft ?Neurologic: alert and oriented ? ?CBC ?   ?Component Value Date/Time  ? WBC 5.9 07/09/2021 0200  ? RBC 2.39 (L) 07/09/2021 0200  ? HGB 8.0 (L) 07/09/2021 0200  ? HGB 8.4 (L) 08/22/2020 1141  ? HCT 25.7 (L) 07/09/2021 0200  ? HCT 25.0 (L) 08/22/2020 1141  ? PLT 155 07/09/2021 0200  ? PLT 184 08/22/2020 1141  ? MCV 107.5 (H) 07/09/2021 0200  ? MCV 94 08/22/2020 1141  ? MCH 33.5 07/09/2021 0200  ? MCHC 31.1 07/09/2021 0200  ? RDW 19.9 (H) 07/09/2021 0200  ? RDW 16.7 (H) 08/22/2020 1141  ? LYMPHSABS 1.6 07/09/2021 0200  ? LYMPHSABS 1.8 08/22/2020 1141  ? MONOABS 0.7 07/09/2021 0200  ? EOSABS 0.3 07/09/2021 0200  ? EOSABS 0.3 08/22/2020 1141  ? BASOSABS 0.0 07/09/2021 0200  ? BASOSABS 0.0 08/22/2020 1141  ? ? ?BMET ?   ?Component Value Date/Time  ? NA 135 07/09/2021 0200  ? NA 138 08/22/2020 1141  ? K 4.3 07/09/2021 0200  ? CL 97 (L) 07/09/2021 0200  ? CO2 25 07/09/2021 0200  ? GLUCOSE 100 (H) 07/09/2021 0200  ? BUN 31 (H) 07/09/2021 0200  ? BUN 20 08/22/2020 1141  ? CREATININE 10.07 (H) 07/09/2021 0200  ? CALCIUM 9.7 07/09/2021 0200  ? CALCIUM 7.9 (L) 08/01/2019 3662  ? GFRNONAA 5 (L) 07/09/2021 0200  ? GFRAA 7 (L) 12/28/2019 1640  ? ? ?INR ?   ?Component Value Date/Time  ? INR 1.1 07/04/2021 0554  ? ? ? ?Intake/Output Summary (Last 24 hours) at 07/09/2021 0747 ?Last data filed at 07/09/2021 0317 ?Gross per 24 hour  ?Intake 1540 ml  ?Output 380 ml  ?Net 1160 ml  ? ? ? ?Assessment/Plan:  61 y.o. male is s/p Aortogram,  arteriogram RLE with right SFA angioplasty and stenting 1 Day Post-Op  ? ?Left common femoral access site c/d/I without swelling or hematoma ?RLE well perfused and warm. Doppler PT signal ?Left BKA doing okay. Continue local wound care with wet to dry dressing changes daily ?Continue Asa 81 mg indefinitely. Plavix 75 mg x 12 months. Statin therapy indefinitely ?He will follow up in our office in 1 month with ABIs and RLE arterial duplex ? ? ?Karoline Caldwell, PA-C ?[facility] ?[phone] ?07/09/2021 ?7:47 AM ? ?VASCULAR STAFF ADDENDUM: ?I have independently interviewed and examined the patient. ?I agree with the above.  ?Continue WTD dressing to the left stump ?Excellent PT signal on exam  ?Will follow up in the office  ? ?J. Melene Muller, MD ?[facility] ?Office Phone Number: [phone] ?07/09/2021 9:05 AM ? ? ?

## 2021-07-09 NOTE — TOC Progression Note (Signed)
Transition of Care (TOC) - Progression Note  ? ? ?Patient Details  ?Name: Daniel [name] ?MRN: [id number] ?Date of Birth: [birth year] ? ?Transition of Care (TOC) CM/SW Contact  ?Vinie Sill, LCSW ?Phone Number: ?07/09/2021, 3:13 PM ? ?Clinical Narrative:    ? ?CSW met with patient at bedside. CSW  introduced self and explained role. CSW confirmed with patient he was at [facility] and expected to return when medically stable. Patient states no questions or concerns at this time. ? ?TOC will continue to follow and assist with discharge planning. ? ?Thurmond Butts, MSW, LCSW ?Clinical Social Worker ? ? ? ?Expected Discharge Plan: [place] ?Barriers to Discharge: Continued Medical Work up, SNF Pending bed offer ? ?Expected Discharge Plan and Services ?Expected Discharge Plan: [place] ?  ?  ?  ?  ?                ?  ?  ?  ?  ?  ?  ?  ?  ?  ?  ? ? ?Social Determinants of Health (SDOH) Interventions ?  ? ?Readmission Risk Interventions ?Readmission Risk Prevention Plan 02/27/2021 07/07/2019  ?Transportation Screening Complete Complete  ?PCP or Specialist Appt within 3-5 Days - Complete  ?[place] or Home Care Consult - Complete  ?Social Work Consult for [place] Planning/Counseling - Complete  ?Palliative Care Screening - Not Applicable  ?Medication Review Press photographer) Complete Complete  ?PCP or Specialist appointment within 3-5 days of discharge Complete -  ?[place] or Home Care Consult Complete -  ?SW Recovery Care/Counseling Consult Complete -  ?Palliative Care Screening Not Applicable -  ?[place] Not Applicable -  ?Some recent data might be hidden  ? ? ?

## 2021-07-09 NOTE — Progress Notes (Signed)
?PROGRESS NOTE ? ? ? ?Daniel [name]  VOZ:[id number] DOB: 01-Dec-1960 DOA: 07/03/2021 ?PCP: [name], NP  ? ?Brief Narrative:  ?The patient is a 61 year old with history of ESRD on hemodialysis Monday Wednesday Friday, anemia of chronic disease with baseline hemoglobin 7-8, multiple AV malformations GI tract, recent left below-knee amputation and currently at a skilled nursing facility brought to emergency room from dialysis center after he had a large volume fresh rectal bleed after completing dialysis on 3/8. ? ?Patient was mainly complaining of right-sided chest pain, lot of pain on his left below-knee amputation stump and also on the right leg.  Denied any nausea vomiting or abdominal pain or distention. Seen by vascular surgery on arrival.  CT angiogram lower extremities without evidence of acute blockages. ? ?Patient underwent an angiogram of the right lower extremity with third order cannulation and he also underwent superficial femoral artery angioplasty and stenting.  Postoperatively still complaining of some leg pain.  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  They are recommending continue PPIs and since he is stable they are signing off the case.  Patient is getting dialyzed and recommendations to keep hemoglobin greater than 8.  ? ? ?Assessment and Plan: ?Acute lower GI bleeding, acute on chronic anemia of blood loss:  ?-Known previous AVMs. Patient had multiple fresh and maroon-colored stool . ?-Hemoglobin down to 7-1 unit PRBC transfusion with appropriate response.  Hemoglobin 8.5 yesterday and today it is 8.3/26.0. ?-No clinical evidence of ongoing bleeding, however patient has not have a bowel movement yet.  GI evaluated and felt that he is stable and recommending PPIs and have signed off the case ?-Currently  hemodynamically stable. ?-Upper GI endoscopy with nonbleeding duodenal ulcer.  Remains on PPI. ?Underwent flex sig, found fresh bleeding around the proximal sigmoid colon.  Full colonoscopy not done. ?Remained stable without further bleeding, CT angiogram suggested if any further bleeding. ?-CT angiogram for the lower extremities also showed pooling of blood in the rectal area. ?-Patient is not on any antiplatelets or antithrombotics. ?Dialysis patient with significant cardiovascular comorbidities, will keep hemoglobin more than 8.  Getting IV iron  ?-We will need to monitor for any further bleeding before transition into a skilled nursing facility. ?-Continue dual antiplatelet therapy as below ?-Hemoglobin/hematocrit is now relatively stable and went from 8.3/26.0 and is now 8.0/25.7 with an MCV of 107.5 ?-Continue monitor carefully and repeat CBC in a.m. ?-PT OT recommending SNF ?  ?Right-sided chest pain:  ?-Complaint of severe pleuritic chest pain, CT angiogram of the chest negative for pulmonary embolism.  Musculoskeletal pain. -Lidocaine patch and Percocet.  Improving. ?  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  Discussed case with Dr. Kipp Brood, CT CS.  Advised outpatient follow-up.  Dr. Stanton Kidney has sent a referral to [facility] health CT CS office. ?  ?Severe peripheral vascular disease status post left below-knee amputation now with Right Leg and Foot Pain ?-Seen by vascular surgery.  Symptomatic treatment suggested. -CT angiogram without any evidence of acute arterial compromise.   ?-Left lower BKA done and he had some slight wound dehiscence so we will call vascular surgery to further reevaluate and wound care ?-Patient continues to complain of pain on the right foot.   ?-Angiogram done today  and he had an angioplasty and stenting. ?-Vascular surgery recommending aspirin 81 mg p.o. daily indefinitely as well as Plavix 75 mg p.o. daily for  12 months and a high intensity statin along with a follow-up with vascular surgery within 1 month with ABIs and a right lower extremity duplex ?-Continue to monitor and PT OT recommending SNF ?  ?Essential hypertension: ?-Blood pressure was elevated ?-Continue monitor blood pressures per protocol ?-Last blood pressure reading is now 138/80 after 2 L of ultrafiltration of dialysis ?  ?ESRD on hemodialysis ?Hyperphosphatemia ?-Getting hemodialysis on schedule. ?-Did not get dialysis yesterday after his angiogram which showed right SFA PTA and stenting but did receive it today ?-Patient's BUNs/creatinine now 27/8.76 yesterday and today was 31/10.07 prior to dialysis ?-Continue further care per nephrology and he will be undergoing a 4K bath with 2 L of net ultrafiltration and get him back on schedule tomorrow given that he dialyzes Monday Wednesday Friday ?-Patient's Phos level is now 5.9 ?-Avoid further nephrotoxic medications, contrast dyes, hypotension and dehydration and renally dose medications ?-Repeat CMP in a.m. ? ?Left Stump Wound ?-WOC Nurse ?-Care feels that his left BKA is doing okay recommend continue local wound care with wet-to-dry dressing changes as delineated by the wound care nurse ?  ?Obesity ?-Complicates overall prognosis and care ?-Estimated body mass index is 36.74 kg/m? as calculated from the following: ?  Height as of this encounter: '5\' 11"'$  (1.803 m). ?  Weight as of this encounter: 119.5 kg.  ?-Weight Loss and Dietary Counseling given ? ?DVT prophylaxis: heparin injection 5,000 Units Start: 07/08/21 2200 ?SCD's Start: 07/08/21 1106 ?SCDs Start: 07/03/21 2110 ? ?  Code Status: Full Code ?Family Communication: No family currently at bedside ? ?Disposition Plan:  ?Level of care: Progressive ?Status is: Inpatient ?Remains inpatient appropriate because: Needs clearance by vascular surgery and will need to go back to SNF.  He will be dialyzed tomorrow to get him back on schedule but pending vascular  and nephrology clearance can be likely discharged back to his skilled nursing facility ?  ? ?Consultants:  ?Vascular surgery ?Gastroenterology ?Nephrology ?  ? ?Procedures:  ?Flexible sigmoidoscopy and right lower extremity angiogram with angioplasty and stenting ? ?Antimicrobials:  ?Anti-infectives (From admission, onward)  ? ? None  ? ?  ?  ? ?Subjective: ?And examined at bedside in the dialysis unit and he was still complaining of some right foot pain.  Denies any nausea or vomiting.  Denies any lightheadedness or dizziness and no chest pain now.  Denies any further black stools at this point.  No other concerns or complaints at this time. ? ?Objective: ?Vitals:  ? 07/09/21 1200 07/09/21 1210 07/09/21 1315 07/09/21 1642  ?BP: (!) 170/93 (!) 182/71 139/83 138/80  ?Pulse: 86 88  87  ?Resp:  14  17  ?Temp:  (!) 97.5 ?F (36.4 ?C) 97.8 ?F (36.6 ?C) 98.3 ?F (36.8 ?C)  ?TempSrc:  Temporal Oral Oral  ?SpO2:  97%  98%  ?Weight:  119.5 kg    ?Height:      ? ? ?Intake/Output Summary (Last 24 hours) at 07/09/2021 1827 ?Last data filed at 07/09/2021 1210 ?Gross per 24 hour  ?Intake 1100 ml  ?Output 2180 ml  ?Net -1080 ml  ? ?Filed Weights  ? 07/09/21 0310 07/09/21 0839 07/09/21 1210  ?Weight: 121.5 kg 121.5 kg 119.5 kg  ? ?Examination: ?Physical Exam: ? ?Constitutional: WN/WD obese chronically ill-appearing African-American male in no acute distress ?Respiratory: Diminished to auscultation bilaterally with some  coarse breath sounds, no wheezing, rales, rhonchi or crackles. Normal respiratory effort and patient is not tachypenic. No accessory muscle use.  Unlabored breathing ?Cardiovascular: RRR, no murmurs / rubs / gallops. S1 and S2 auscultated.  ?Abdomen: Soft, non-tender, distended secondary body habitus. Bowel sounds positive.  ?GU: Deferred. ?Musculoskeletal: Has a left BKA with a wound ?Neurologic: CN 2-12 grossly intact with no focal deficits.  ? ?Data Reviewed: I have personally reviewed following labs and imaging  studies ? ?CBC: ?Recent Labs  ?Lab 07/03/21 ?1700 07/03/21 ?2144 07/04/21 ?0143 07/04/21 ?8022 07/06/21 ?1798 07/06/21 ?1631 07/07/21 ?0432 07/08/21 ?0301 07/09/21 ?0200  ?WBC 4.4  --  5.5  --   --   --   --  5.6 5.9

## 2021-07-10 DIAGNOSIS — K922 Gastrointestinal hemorrhage, unspecified: Secondary | ICD-10-CM | POA: Diagnosis not present

## 2021-07-10 LAB — COMPREHENSIVE METABOLIC PANEL
ALT: 11 U/L (ref 0–44)
AST: 35 U/L (ref 15–41)
Albumin: 2.5 g/dL — ABNORMAL LOW (ref 3.5–5.0)
Alkaline Phosphatase: 66 U/L (ref 38–126)
Anion gap: 13 (ref 5–15)
BUN: 19 mg/dL (ref 8–23)
CO2: 24 mmol/L (ref 22–32)
Calcium: 9.1 mg/dL (ref 8.9–10.3)
Chloride: 95 mmol/L — ABNORMAL LOW (ref 98–111)
Creatinine, Ser: 6.6 mg/dL — ABNORMAL HIGH (ref 0.61–1.24)
GFR, Estimated: 9 mL/min — ABNORMAL LOW (ref >60)
Glucose, Bld: 95 mg/dL (ref 70–99)
Potassium: 4.2 mmol/L (ref 3.5–5.1)
Sodium: 132 mmol/L — ABNORMAL LOW (ref 135–145)
Total Bilirubin: 0.7 mg/dL (ref 0.3–1.2)
Total Protein: 7.6 g/dL (ref 6.5–8.1)

## 2021-07-10 LAB — CBC WITH DIFFERENTIAL/PLATELET
Abs Immature Granulocytes: 0.04 10*3/uL (ref 0.00–0.07)
Basophils Absolute: 0 10*3/uL (ref 0.0–0.1)
Basophils Relative: 0 %
Eosinophils Absolute: 0.2 10*3/uL (ref 0.0–0.5)
Eosinophils Relative: 4 %
HCT: 26.3 % — ABNORMAL LOW (ref 39.0–52.0)
Hemoglobin: 8.4 g/dL — ABNORMAL LOW (ref 13.0–17.0)
Immature Granulocytes: 1 %
Lymphocytes Relative: 25 %
Lymphs Abs: 1.6 10*3/uL (ref 0.7–4.0)
MCH: 34 pg (ref 26.0–34.0)
MCHC: 31.9 g/dL (ref 30.0–36.0)
MCV: 106.5 fL — ABNORMAL HIGH (ref 80.0–100.0)
Monocytes Absolute: 0.9 10*3/uL (ref 0.1–1.0)
Monocytes Relative: 14 %
Neutro Abs: 3.6 10*3/uL (ref 1.7–7.7)
Neutrophils Relative %: 56 %
Platelets: 145 10*3/uL — ABNORMAL LOW (ref 150–400)
RBC: 2.47 MIL/uL — ABNORMAL LOW (ref 4.22–5.81)
RDW: 19.7 % — ABNORMAL HIGH (ref 11.5–15.5)
WBC: 6.3 10*3/uL (ref 4.0–10.5)
nRBC: 0.9 % — ABNORMAL HIGH (ref 0.0–0.2)

## 2021-07-10 LAB — MAGNESIUM: Magnesium: 1.9 mg/dL (ref 1.7–2.4)

## 2021-07-10 LAB — HEPATITIS B SURFACE ANTIBODY, QUANTITATIVE: Hep B S AB Quant (Post): 47.6 m[IU]/mL (ref >9.9)

## 2021-07-10 LAB — PHOSPHORUS: Phosphorus: 3.1 mg/dL (ref 2.5–4.6)

## 2021-07-10 MED ORDER — CLOPIDOGREL BISULFATE 75 MG PO TABS: 75.0000 mg | ORAL_TABLET | Freq: Every day | ORAL | 0 refills | Status: DC

## 2021-07-10 MED ORDER — ATORVASTATIN CALCIUM 40 MG PO TABS: 40.0000 mg | ORAL_TABLET | Freq: Every day | ORAL | 0 refills | Status: DC

## 2021-07-10 MED ORDER — ASPIRIN 81 MG PO TBEC: 81.0000 mg | DELAYED_RELEASE_TABLET | Freq: Every day | ORAL | 0 refills | Status: DC

## 2021-07-10 NOTE — Progress Notes (Signed)
Daniel [name] Placed called and given report , PTAR on the way  Medication/discharge instruction given and placed in packet  ? ?Phoebe Sharps, RN ? ? ? ?

## 2021-07-10 NOTE — TOC Transition Note (Signed)
Transition of Care (TOC) - CM/SW Discharge Note ? ? ?Patient Details  ?Name: Daniel [name] ?MRN: [id number] ?Date of Birth: [birth year] ? ?Transition of Care (TOC) CM/SW Contact:  ?Vinie Sill, LCSW ?Phone Number: ?07/10/2021, 4:39 PM ? ? ?Clinical Narrative:    ? ?Patient will Discharge to: [facility]  ?Discharge Date: 07/10/2021 ?Family Notified: brother-Ricky ?Transport By: Corey Harold  ? ?Per MD patient is ready for discharge. RN, patient, and facility notified of discharge. Discharge Summary sent to facility. RN given number for [phone]. Ambulance transport requested for patient.  ? ?Clinical Social Worker signing off. ? ?Thurmond Butts, MSW, LCSW ?Clinical Social Worker ? ? ? ? ?Final next level of care: [place] ?Barriers to Discharge: Continued Medical Work up, SNF Pending bed offer ? ? ?Patient Goals and CMS Choice ?  ?  ?  ? ?Discharge Placement ?  ?           ?  ?  ?  ?  ? ?Discharge Plan and Services ?  ?  ?           ?  ?  ?  ?  ?  ?  ?  ?  ?  ?  ? ?Social Determinants of Health (SDOH) Interventions ?  ? ? ?Readmission Risk Interventions ?Readmission Risk Prevention Plan 02/27/2021 07/07/2019  ?Transportation Screening Complete Complete  ?PCP or Specialist Appt within 3-5 Days - Complete  ?[place] or Home Care Consult - Complete  ?Social Work Consult for [place] Planning/Counseling - Complete  ?Palliative Care Screening - Not Applicable  ?Medication Review Press photographer) Complete Complete  ?PCP or Specialist appointment within 3-5 days of discharge Complete -  ?[place] or Home Care Consult Complete -  ?SW Recovery Care/Counseling Consult Complete -  ?Palliative Care Screening Not Applicable -  ?[place] Not Applicable -  ?Some recent data might be hidden  ? ? ? ? ? ?

## 2021-07-10 NOTE — Progress Notes (Signed)
Physical Therapy Treatment ?Patient Details ?Name: Daniel [name] ?MRN: [id number] ?DOB: 06/25/1960 ?Today's Date: 07/10/2021 ? ? ?History of Present Illness Pt is a 61 y/o male presenting on 3/8 from SNF for rectal bleeding, R sided chest pain. Found with acute GI bleed. PMH includes: anemia, DM, ESRD on HD (MWF), HTN, ICD, PAD s/p L BKA. ? ?  ?PT Comments  ? ? Pt received in chair, agreeable to therapy session with encouragement. Emphasis on safe transfers, use of RW with increased foot clearance as able, benefits of frequent mobility, LLE post-amputation precautions (not to keep pillow under knee, to place at distal end of limb if needed for edema reduction but not folded up under his knee, and therapeutic exercises for ROM/strengthening. Pt continues to benefit from PT services to progress toward functional mobility goals.   ?Recommendations for follow up therapy are one component of a multi-disciplinary discharge planning process, led by the attending physician.  Recommendations may be updated based on patient status, additional functional criteria and insurance authorization. ? ?Follow Up Recommendations ? Skilled nursing-short term rehab (<3 hours/day) ?  ?  ?Assistance Recommended at Discharge Intermittent Supervision/Assistance  ?Patient can return home with the following A little help with walking and/or transfers;Help with stairs or ramp for entrance ?  ?Equipment Recommendations ? None recommended by PT  ?  ?Recommendations for Other Services   ? ? ?  ?Precautions / Restrictions Precautions ?Precautions: Fall ?Precaution Comments: recent L BKA (non healing) ?Restrictions ?Weight Bearing Restrictions: Yes ?LLE Weight Bearing: Non weight bearing ?Other Position/Activity Restrictions: non-healing wound on end of LLE, assume protective NWB due to wound  ?  ? ?Mobility ? Bed Mobility ?Overal bed mobility: Modified Independent ?  ?  ?  ?  ?  ?  ?General bed mobility comments: cues for body positioning toward  HOB prior to lying down, pt not needing physical assist ?  ? ?Transfers ?Overall transfer level: Needs assistance ?Equipment used: Rolling walker (2 wheels) ?Transfers: Sit to/from Stand ?Sit to Stand: Min guard ?  ?  ?  ?  ?  ?General transfer comment: from recliner chair>RW and RW>EOB ?  ? ?Ambulation/Gait ?Ambulation/Gait assistance: Min guard ?Gait Distance (Feet): 15 Feet ?Assistive device: Rolling walker (2 wheels) ?Gait Pattern/deviations: Shuffle, Step-to pattern ?Gait velocity: decr ?  ?  ?General Gait Details: poor R foot clearance, pt tending to shuffle (with slipper donned on RLE), cues for press and sway technique but pt unable to significantly increase RLE clearance despite cues; HR ~105 bpm ? ? ?  ?Balance Overall balance assessment: Needs assistance ?Sitting-balance support: No upper extremity supported, Feet supported ?Sitting balance-Leahy Scale: Good ?  ?  ?Standing balance support: Bilateral upper extremity supported, During functional activity ?Standing balance-Leahy Scale: Poor ?Standing balance comment: Walker and min guard ?  ?  ?  ?  ?  ?  ?  ?  ?  ?  ?  ?  ? ?  ?Cognition Arousal/Alertness: Awake/alert ?Behavior During Therapy: [facility] for tasks assessed/performed ?Overall Cognitive Status: Within Functional Limits for tasks assessed ?  ?  ?  ?General Comments: calm, cooperative with encouragement ?  ?  ? ?  ?Exercises General Exercises - Lower Extremity ?Quad Sets: 10 reps, Supine, Left ?Other Exercises ?Other Exercises: encourage hip abduction/flexion, knee flex/ext frequently throughout day to maintain ROM, not to keep L knee bent during day at rest ? ?  ?General Comments General comments (skin integrity, edema, etc.): VSS on RA, no acute s/sx distress ?  ?  ? ?  Pertinent Vitals/Pain Pain Assessment ?Pain Assessment: 0-10 ?Pain Score: 8  ?Pain Location: L BKA ?Pain Descriptors / Indicators: Discomfort ?Pain Intervention(s): Monitored during session, Repositioned, Premedicated before session  (LLE elevated at tip of limb, encouraged him not to rest with L knee bent to prevent contracture)  ? ? ?   ?   ? ?PT Goals (current goals can now be found in the care plan section) Acute Rehab PT Goals ?Patient Stated Goal: get leg to heal ?PT Goal Formulation: With patient ?Time For Goal Achievement: 07/20/21 ?Progress towards PT goals: Progressing toward goals ? ?  ?Frequency ? ? ? Min 2X/week ? ? ? ?  ?PT Plan Current plan remains appropriate  ? ? ?   ?AM-PAC PT "6 Clicks" Mobility   ?Outcome Measure ? Help needed turning from your back to your side while in a flat bed without using bedrails?: None ?Help needed moving from lying on your back to sitting on the side of a flat bed without using bedrails?: None ?Help needed moving to and from a bed to a chair (including a wheelchair)?: A Little ?Help needed standing up from a chair using your arms (e.g., wheelchair or bedside chair)?: A Little ?Help needed to walk in hospital room?: Total ((walks <20 ft prior to fatigue)) ?Help needed climbing 3-5 steps with a railing? : Total ?6 Click Score: 16 ? ?  ?End of Session   ?Activity Tolerance: Patient tolerated treatment well ?Patient left: in bed;with call bell/phone within reach;with bed alarm set ?Nurse Communication: Mobility status ?PT Visit Diagnosis: Other abnormalities of gait and mobility (R26.89) ?  ? ? ?Time: 9242-6834 ?PT Time Calculation (min) (ACUTE ONLY): 9 min ? ?Charges:  $Therapeutic Activity: 8-22 mins          ?          ? ?Daniel Rafuse P., PTA ?Acute Rehabilitation Services ?Pager: [phone] ?Office: [phone]  ? ? ?Daniel [name] ?07/10/2021, 5:24 PM ? ?

## 2021-07-10 NOTE — Progress Notes (Signed)
D/C order noted. Contacted [facility] and spoke to [place]. Clinic advised pt for d/c today and to resume care on Friday.  ? ?Melven Sartorius ?Renal Navigator ?[phone] ?

## 2021-07-10 NOTE — Consult Note (Signed)
? ?  [facility] CM Inpatient Consult ? ? ?07/10/2021 ? ?Daniel [name] ?[birth year] ?[id number] ? ?[facility] [ACO] Patient: Medicare ACO [place] ? ?Primary Care Provider:  [name], NP, [facility] ? ? ?Patient screened for less than 30 days readmission with 8 hospitalizations in the past 6 months with noted extreme high risk score for unplanned readmission risk and  to assess for potential [facility] service needs for post hospital transition.  Review of patient's medical record reveals patient is to return to [facility] [[facility] skilled nursing facility level of care. ? ? ?Plan:  Patient's post hospital care needs are to be met at a skilled nursing facility level of care.  ? ?For questions contact:  ? ?Natividad Brood, RN BSN CCM ?[facility] Liaison ? [phone] business mobile phone ?Toll free office [phone]  ?Fax number: [phone] ?[email or web address] ?[email or web address]  ? ? ? ?

## 2021-07-10 NOTE — Progress Notes (Signed)
[facility] ?Progress Note ? ?Home meds include - norvasc, phoslo 2 ac, clonidine 0.1 mwf, neurontin 100 tid, lopressor '100mg'$  prn, MVI, prosouce plus, protonix, florastor, prns/ vits/ supps ? ?OP HD: MWF  ?  4h  450/500   119kg   2/2 bath  Hep none  LUA AVF ? - venofer '100mg'$  q HD, 7 left ? - mircera 225 q2, last 3/08 ? - hectorol 1 ug tiw IV ?  ?  ?Assessment/ Plan: ?GI bleed - hx of PUD and bleeding AVMs.  Enteroscopy 3/10 by GI showed non- bleeding AVM's. Flex sig showed fresh bleeding in prox sigmoid colon, colon full of blood.  Keep Hb > 8 given sig CV disease.  Hb up at 8.4 today. GI following.  ?R foot pain/ hx PAD/ hx L BKA - angiogram 3/13 with rt SFA PTA and stenting nad good perfusion per VVS. 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 TCTS saw, advised OP f/u.  ?ESRD- on HD MWF.  ? ?Seen on HD  ?4K bath  ?1L net UF as tolerated ? ?Will plan on outpatient HD Friday (back on schedule) ? ?BP/ volume - cont BP medications, no vol excess, on RA, at dry wt  ?Anemia ckd - next esa due 3/22, cont IV Fe load. Transfuse prn per pmd.  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 Ca in range.  ?Dispo - will go back to SNF when ready ? ?Subjective: seen in dialysis; main complaint is plantar surface right pain. Denies f/c/n/v/dyspnea. ? ?Vitals:  ? 07/10/21 0808 07/10/21 1142 07/10/21 1150 07/10/21 1200  ?BP: 129/83 (!) 180/94 (!) 186/88 (!) 177/88  ?Pulse: 82 84 81 81  ?Resp: 17 (!) '23 12 10  '$ ?Temp: 97.9 ?F (36.6 ?C) 97.7 ?F (36.5 ?C)    ?TempSrc: Oral     ?SpO2: 98%     ?Weight:  120 kg    ?Height:      ? ? ?Exam: ? alert, nad  ? no jvd ? Chest cta bilat ? Cor reg no RG ? Abd soft ntnd no ascites ? MS L BKA wrapped ?Ext no LE or UE edema ?Neuro is alert, Ox 3 , nf ? LUA AVF +bruit ? ?  ? ? ?Recent Labs  ?Lab 07/09/21 ?0200 07/10/21 ?0245  ?HGB 8.0* 8.4*  ?ALBUMIN 2.5* 2.5*  ?CALCIUM 9.7 9.1  ?PHOS 5.9* 3.1  ?CREATININE 10.07* 6.60*  ?K 4.3 4.2  ? ?Inpatient medications: ? sodium chloride   Intravenous Once  ?  amLODipine  10 mg Oral QHS  ? aspirin EC  81 mg Oral Daily  ? atorvastatin  40 mg Oral Daily  ? Chlorhexidine Gluconate Cloth  6 each Topical Q0600  ? cloNIDine  0.1 mg Oral Q M,W,F-2000  ? cloNIDine  0.1 mg Oral 2 times per day on Sun Tue Thu Sat  ? clopidogrel  75 mg Oral Daily  ? gabapentin  100 mg Oral TID  ? heparin  5,000 Units Subcutaneous Q8H  ? hydrocortisone cream   Topical BID  ? lidocaine  1 patch Transdermal Q24H  ? nicotine  14 mg Transdermal Daily  ? pantoprazole  40 mg Oral BID  ? polyethylene glycol  17 g Oral Daily  ? sodium chloride flush  3 mL Intravenous Q12H  ? ? sodium chloride    ? ferric gluconate (FERRLECIT) IVPB Stopped (07/08/21 1802)  ? ?sodium chloride, acetaminophen **OR** acetaminophen, hydrALAZINE, hydrOXYzine, labetalol, oxyCODONE-acetaminophen, sodium chloride flush ? ? ? ? ? ? ?

## 2021-07-10 NOTE — Progress Notes (Signed)
PT Cancellation Note ? ?Patient Details ?Name: DEUNDRE THONG ?MRN: [id number] ?DOB: 10-07-1960 ? ? ?Cancelled Treatment:    Reason Eval/Treat Not Completed: (P) Patient at procedure or test/unavailable (pt at HD dept.) ? ? ?Xhaiden Coombs M Manasvi Dickard ?07/10/2021, 12:29 PM ? ? ?

## 2021-07-10 NOTE — Discharge Summary (Signed)
Daniel [name] KDT:[id number] DOB: 01-12-1961 DOA: 07/03/2021 ? ?PCP: [name], NP ? ?Admit date: 07/03/2021 ?Discharge date: 07/10/2021 ? ?Admitted From: SNF ?Disposition:  SNF ? ?Recommendations for Outpatient Follow-up:  ?Follow up with PCP in 1 week ?Please obtain BMP/CBC in one week ?Please follow up vascular surgery ?Follow up with oncology Dr. Curt Bears in one week ? ? ? ? ?Discharge Condition:Stable ?CODE STATUS:FULL  ?Diet recommendation: Renal diet carb modified ? ? ?Brief/Interim Summary: ?Per HPI:60 year old with history of ESRD on hemodialysis Monday Wednesday Friday, anemia of chronic disease with baseline hemoglobin 7-8, multiple AV malformations GI tract, recent left below-knee amputation and currently at a skilled nursing facility brought to emergency room from dialysis center after he had a large volume fresh rectal bleed after completing dialysis on 3/8. ?Patient was mainly complaining of right-sided chest pain, lot of pain on his left below-knee amputation stump and also on the right leg.  Denied any nausea vomiting or abdominal pain or distention. Seen by vascular surgery on arrival.  CT angiogram lower extremities without evidence of acute blockages. ? ?Vital signs in the ED were notable for the following: Afebrile; heart rate 75-86; blood pressure 1 111/56 - 145/87 mmHg; respiratory rate 17-20, oxygen saturation 97 to 100% on room air. ?  ?Labs were notable for the following: BMP notable for the following: BUN 11.  High-sensitivity troponin I initially 11, with repeat value trending down to 10.  CBC notable for white blood cell count 4400, hemoglobin 8.0 compared to most recent prior hemoglobin data point of 7.8 on 06/21/2021, with this evening's hemoglobin noted to be associated with macrocytic/normochromic findings, platelet count 173.  INR 1.1.  Type and screen initiated in the ED this evening.  DRE performed by EDP revealed brown-colored stool, but with positive fecal occult  blood test result.  COVID-19/influenza PCR negative. ?  ? 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   ?Underwent flex sig, found fresh bleeding around the proximal sigmoid colon.  Full colonoscopy not done. ?CTA LE please see report below ?Received IV iron ?H/H remains stable ?  ?Right-sided chest pain: Complaint of severe pleuritic chest pain, CT angiogram of the chest negative for pulmonary embolism.  Musculoskeletal pain.  Lidocaine patch and Percocet.  Improving. ?  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  Discussed case with Dr. Kipp Brood, CT CS.  Advised outpatient follow-up.  ?Will also follow up with oncology Dr. Julien Nordmann, he will setup appoint for the patient. ? ?  ?Severe peripheral vascular disease : ?status post left below-knee amputation ?Vascualr surgery was consulted ?CT angiogram without any evidence of acute arterial compromise.  ?Continued to have pain, s/p angiogram 3/13 with rt SFA PTA and stenting ? Left common femoral access site c/d/I without swelling or hematoma ?RLE well perfused and warm. Doppler PT signal ?Left BKA doing okay. Continue local wound care with wet to dry dressing changes daily ?Continue Asa 81 mg indefinitely. Plavix 75 mg x 12 months. Statin therapy indefinitely ?He will follow up in our office in 1 month with ABIs and RLE arterial duplex ? ?  ?Essential hypertension:  ?Stable, continue meds ? ? ?  ?ESRD on hemodialysis:  ?HD MWF ?  ?  ?  ? ? ? ? ?  Discharge Diagnoses:  ?Principal Problem: ?  Acute lower GI bleeding ?Active Problems: ?  Essential hypertension ?  Prolonged QT interval ?  End-stage renal disease on hemodialysis ([place]) ?  GERD (gastroesophageal reflux  disease) ?  Right leg pain ?  GI bleeding ?  Pressure injury of skin ? ? ? ?Discharge Instructions ? ?Discharge Instructions   ? ? Ambulatory referral to Cardiothoracic Surgery   Complete by: As directed ?  ? Right lung tumor evaluation/ follow up.  ? Diet - low sodium heart healthy   Complete by: As directed ?  ? Diet - low sodium heart healthy   Complete by: As directed ?  ? Renal  ? Discharge wound care:   Complete by: As directed ?  ? Wet to dry dressing left amputation stump, keep with compression dressing  ? Discharge wound care:   Complete by: As directed ?  ? As above  ? Increase activity slowly   Complete by: As directed ?  ? Increase activity slowly   Complete by: As directed ?  ? ?  ? ?Allergies as of 07/10/2021   ? ?   Reactions  ? Seroquel [quetiapine] Other (See Comments)  ? Tardive kinesia/dystonia  ? Dilaudid [hydromorphone Hcl] Itching, Other (See Comments)  ? Pt reports itchiness after IM injection   ? ?  ? ?  ?Medication List  ?  ? ?STOP taking these medications   ? ?metoprolol tartrate 100 MG tablet ?Commonly known as: LOPRESSOR ?  ? ?  ? ?TAKE these medications   ? ?(feeding supplement) PROSource Plus liquid ?Take 30 mLs by mouth 3 (three) times daily. ?  ?amLODipine 10 MG tablet ?Commonly known as: NORVASC ?Take 1 tablet (10 mg total) by mouth at bedtime. ?  ?aspirin 81 MG EC tablet ?Take 1 tablet (81 mg total) by mouth daily. Swallow whole. ?  ?atorvastatin 40 MG tablet ?Commonly known as: LIPITOR ?Take 1 tablet (40 mg total) by mouth daily. ?  ?blood glucose meter kit and supplies Kit ?Dispense based on patient and insurance preference. Use up to four times daily as directed. (FOR ICD-9 250.00, 250.01). ?  ?calcium acetate 667 MG capsule ?Commonly known as: PHOSLO ?Take 1,334 mg by mouth 3 (three) times daily with meals. ?  ?cloNIDine 0.1 MG tablet ?Commonly known as: CATAPRES ?Take 0.1-0.2 mg by mouth See admin instructions. Take two tablet (0.2 mg) twice daily at 9am and 6pm on Sunday,  Tuesday, Thursday, Saturday (non-dialysis days), take one tablet (0.1 mg) at bedtime - 9pm on Monday, Wednesday, Friday (dialysis days) ?  ?clopidogrel 75 MG tablet ?Commonly known as: PLAVIX ?Take 1 tablet (75 mg total) by mouth daily. ?  ?ferrous sulfate 325 (65 FE) MG tablet ?Take 325 mg by mouth every morning. ?  ?gabapentin 100 MG capsule ?Commonly known as: NEURONTIN ?Take 1 capsule (100 mg total) by mouth 3 (three) times daily. ?  ?HYDROcodone-acetaminophen 5-325 MG tablet ?Commonly known as: NORCO/VICODIN ?Take 1 tablet by mouth every 6 (six) hours as needed for up to 5 days for moderate pain. ?  ?hydrOXYzine 25 MG tablet ?Commonly known as: ATARAX ?TAKE 1 TABLET BY MOUTH EVERY 8 HOURS AS NEEDED FOR ANXIETY OR  ITCHING ?Strength: 25 mg ?What changed:  ?how much to take ?how to take this ?when to take this ?reasons to take this ?additional instructions ?  ?Lidocaine 5 % Crea ?Apply 1 application. topically every Monday, Wednesday, and Friday. Dialysis days - for itching ?  ?loperamide 2  MG capsule ?Commonly known as: IMODIUM ?Take 2 capsules (4 mg total) by mouth 3 (three) times daily as needed for diarrhea or loose stools. ?What changed: when to take this ?  ?multivitamin with minerals Tabs tablet ?Take 1 tablet by mouth every morning. ?  ?pantoprazole 40 MG tablet ?Commonly known as: PROTONIX ?Take 1 tablet (40 mg total) by mouth 2 (two) times daily. ?  ?saccharomyces boulardii 250 MG capsule ?Commonly known as: FLORASTOR ?Take 1 capsule (250 mg total) by mouth 2 (two) times daily. ?  ?silver sulfADIAZINE 1 % cream ?Commonly known as: SILVADENE ?Apply 1 application. topically every evening. To lower back ?  ? ?  ? ?  ?  ? ? ?  ?Discharge Care Instructions  ?(From admission, onward)  ?  ? ? ?  ? ?  Start     Ordered  ? 07/10/21 0000  Discharge wound care:       ?Comments: As above  ? 07/10/21 1200  ? 07/06/21 0000  Discharge wound care:       ?Comments: Wet to dry dressing left amputation stump, keep with  compression dressing  ? 07/06/21 0932  ? ?  ?  ? ?  ? ? Follow-up Information   ? ? [facility] Follow up in 1 month(s).   ?Why: The office will call the patient with an appointment ?Contact inf

## 2021-07-10 NOTE — Progress Notes (Signed)
removed1016ms net fluid no complaints no complications tolerated tx well,  pre bp 180/94 post bp 193/92 pre weight 120.0kg post weight 119.0kg  2 bandages to lua avf no bleeding dressing cdi ? ?Did not give iv iron as I received in dialysis unit when pt had 4 minutes left to treatment.  ?

## 2021-07-11 ENCOUNTER — Encounter (HOSPITAL_COMMUNITY): Payer: Medicare Other

## 2021-07-12 ENCOUNTER — Encounter: Payer: Self-pay | Admitting: *Deleted

## 2021-07-12 DIAGNOSIS — R918 Other nonspecific abnormal finding of lung field: Secondary | ICD-10-CM

## 2021-07-12 NOTE — Progress Notes (Signed)
Oncology Nurse Navigator Documentation ? ?Oncology Nurse Navigator Flowsheets 07/12/2021  ?Navigator Follow Up Date: 07/18/2021  ?Navigator Follow Up Reason: New Patient Appointment  ?Navigator Location [facility]  ?Navigator Encounter Type Telephone  ?Telephone Outgoing Call  ?Barriers/Navigation Needs Coordination of Care/I called [facility] for Nursing and Rehab.  I was unable to reach anyone nor leave a vm message.   ?Interventions Coordination of Care  ?Acuity Level 2-Minimal Needs (1-2 Barriers Identified)  ?Time Spent with Patient 15  ?  ?

## 2021-07-15 ENCOUNTER — Encounter: Payer: Self-pay | Admitting: *Deleted

## 2021-07-15 NOTE — Progress Notes (Signed)
Oncology Nurse Navigator Documentation ? ?Oncology Nurse Navigator Flowsheets 07/15/2021 07/12/2021  ?Navigator Follow Up Date: - 07/18/2021  ?Navigator Follow Up Reason: - New Patient Appointment  ?Navigator Location [facility]  ?Navigator Encounter Type Telephone Telephone  ?Telephone Outgoing Call [facility] Call  ?Barriers/Navigation Needs Coordination of Care/I called [facility] for Nursing Rehab at [phone] to update transportation for Daniel [name] for 3/23.  I was able to update them today.   Coordination of Care  ?Interventions Coordination of Care Coordination of Care  ?Acuity Level 2-Minimal Needs (1-2 Barriers Identified) Level 2-Minimal Needs (1-2 Barriers Identified)  ?Time Spent with Patient 30 15  ?  ?

## 2021-07-18 ENCOUNTER — Encounter: Payer: Self-pay | Admitting: Internal Medicine

## 2021-07-18 ENCOUNTER — Inpatient Hospital Stay: Payer: Medicare Other | Attending: Internal Medicine | Admitting: Internal Medicine

## 2021-07-18 ENCOUNTER — Other Ambulatory Visit: Payer: Self-pay

## 2021-07-18 ENCOUNTER — Inpatient Hospital Stay: Payer: Medicare Other

## 2021-07-18 VITALS — BP 151/78 | HR 87 | Temp 97.4°F | Resp 20 | Ht 71.0 in | Wt 254.1 lb

## 2021-07-18 DIAGNOSIS — F17210 Nicotine dependence, cigarettes, uncomplicated: Secondary | ICD-10-CM | POA: Diagnosis not present

## 2021-07-18 DIAGNOSIS — I739 Peripheral vascular disease, unspecified: Secondary | ICD-10-CM | POA: Diagnosis not present

## 2021-07-18 DIAGNOSIS — R918 Other nonspecific abnormal finding of lung field: Secondary | ICD-10-CM

## 2021-07-18 DIAGNOSIS — R911 Solitary pulmonary nodule: Secondary | ICD-10-CM | POA: Insufficient documentation

## 2021-07-18 DIAGNOSIS — Z89512 Acquired absence of left leg below knee: Secondary | ICD-10-CM | POA: Diagnosis not present

## 2021-07-18 DIAGNOSIS — N186 End stage renal disease: Secondary | ICD-10-CM | POA: Insufficient documentation

## 2021-07-18 DIAGNOSIS — Z809 Family history of malignant neoplasm, unspecified: Secondary | ICD-10-CM | POA: Insufficient documentation

## 2021-07-18 DIAGNOSIS — Z79899 Other long term (current) drug therapy: Secondary | ICD-10-CM | POA: Diagnosis not present

## 2021-07-18 DIAGNOSIS — Z992 Dependence on renal dialysis: Secondary | ICD-10-CM | POA: Insufficient documentation

## 2021-07-18 LAB — CBC WITH DIFFERENTIAL (CANCER CENTER ONLY)
Abs Immature Granulocytes: 0.02 10*3/uL (ref 0.00–0.07)
Basophils Absolute: 0 10*3/uL (ref 0.0–0.1)
Basophils Relative: 1 %
Eosinophils Absolute: 0.2 10*3/uL (ref 0.0–0.5)
Eosinophils Relative: 5 %
HCT: 24.4 % — ABNORMAL LOW (ref 39.0–52.0)
Hemoglobin: 8.2 g/dL — ABNORMAL LOW (ref 13.0–17.0)
Immature Granulocytes: 1 %
Lymphocytes Relative: 37 %
Lymphs Abs: 1.6 10*3/uL (ref 0.7–4.0)
MCH: 34.3 pg — ABNORMAL HIGH (ref 26.0–34.0)
MCHC: 33.6 g/dL (ref 30.0–36.0)
MCV: 102.1 fL — ABNORMAL HIGH (ref 80.0–100.0)
Monocytes Absolute: 0.6 10*3/uL (ref 0.1–1.0)
Monocytes Relative: 14 %
Neutro Abs: 1.9 10*3/uL (ref 1.7–7.7)
Neutrophils Relative %: 42 %
Platelet Count: 183 10*3/uL (ref 150–400)
RBC: 2.39 MIL/uL — ABNORMAL LOW (ref 4.22–5.81)
RDW: 17.8 % — ABNORMAL HIGH (ref 11.5–15.5)
WBC Count: 4.4 10*3/uL (ref 4.0–10.5)
nRBC: 0.5 % — ABNORMAL HIGH (ref 0.0–0.2)

## 2021-07-18 LAB — CMP (CANCER CENTER ONLY)
ALT: 9 U/L (ref 0–44)
AST: 40 U/L (ref 15–41)
Albumin: 3.1 g/dL — ABNORMAL LOW (ref 3.5–5.0)
Alkaline Phosphatase: 74 U/L (ref 38–126)
Anion gap: 17 — ABNORMAL HIGH (ref 5–15)
BUN: 18 mg/dL (ref 8–23)
CO2: 28 mmol/L (ref 22–32)
Calcium: 9.6 mg/dL (ref 8.9–10.3)
Chloride: 96 mmol/L — ABNORMAL LOW (ref 98–111)
Creatinine: 5.73 mg/dL (ref 0.61–1.24)
GFR, Estimated: 11 mL/min — ABNORMAL LOW (ref >60)
Glucose, Bld: 106 mg/dL — ABNORMAL HIGH (ref 70–99)
Potassium: 3.8 mmol/L (ref 3.5–5.1)
Sodium: 141 mmol/L (ref 135–145)
Total Bilirubin: 0.5 mg/dL (ref 0.3–1.2)
Total Protein: 8 g/dL (ref 6.5–8.1)

## 2021-07-18 NOTE — Patient Instructions (Signed)
Smoking Tobacco Information, Adult Smoking tobacco can be harmful to your health. Tobacco contains a poisonous (toxic), colorless chemical called nicotine. Nicotine is addictive. It changes the brain and can make it hard to stop smoking. Tobacco also has other toxic chemicals that can hurt your body and raise your risk of many cancers. How can smoking tobacco affect me? Smoking tobacco puts you at risk for: Cancer. Smoking is most commonly associated with lung cancer, but can also lead to cancer in other parts of the body. Chronic obstructive pulmonary disease (COPD). This is a long-term lung condition that makes it hard to breathe. It also gets worse over time. High blood pressure (hypertension), heart disease, stroke, or heart attack. Lung infections, such as pneumonia. Cataracts. This is when the lenses in the eyes become clouded. Digestive problems. This may include peptic ulcers, heartburn, and gastroesophageal reflux disease (GERD). Oral health problems, such as gum disease and tooth loss. Loss of taste and smell. Smoking can affect your appearance by causing: Wrinkles. Yellow or stained teeth, fingers, and fingernails. Smoking tobacco can also affect your social life, because: It may be challenging to find places to smoke when away from home. Many workplaces, restaurants, hotels, and public places are tobacco-free. Smoking is expensive. This is due to the cost of tobacco and the long-term costs of treating health problems from smoking. Secondhand smoke may affect those around you. Secondhand smoke can cause lung cancer, breathing problems, and heart disease. Children of smokers have a higher risk for: Sudden infant death syndrome (SIDS). Ear infections. Lung infections. If you currently smoke tobacco, quitting now can help you: Lead a longer and healthier life. Look, smell, breathe, and feel better over time. Save money. Protect others from the harms of secondhand smoke. What  actions can I take to prevent health problems? Quit smoking  Do not start smoking. Quit if you already do. Make a plan to quit smoking and commit to it. Look for programs to help you and ask your health care provider for recommendations and ideas. Set a date and write down all the reasons you want to quit. Let your friends and family know you are quitting so they can help and support you. Consider finding friends who also want to quit. It can be easier to quit with someone else, so that you can support each other. Talk with your health care provider about using nicotine replacement medicines to help you quit, such as gum, lozenges, patches, sprays, or pills. Do not replace cigarette smoking with electronic cigarettes, which are commonly called e-cigarettes. The safety of e-cigarettes is not known, and some may contain harmful chemicals. If you try to quit but return to smoking, stay positive. It is common to slip up when you first quit, so take it one day at a time. Be prepared for cravings. When you feel the urge to smoke, chew gum or suck on hard candy. Lifestyle Stay busy and take care of your body. Drink enough fluid to keep your urine pale yellow. Get plenty of exercise and eat a healthy diet. This can help prevent weight gain after quitting. Monitor your eating habits. Quitting smoking can cause you to have a larger appetite than when you smoke. Find ways to relax. Go out with friends or family to a movie or a restaurant where people do not smoke. Ask your health care provider about having regular tests (screenings) to check for cancer. This may include blood tests, imaging tests, and other tests. Find ways to manage   your stress, such as meditation, yoga, or exercise. Where to find support To get support to quit smoking, consider: Asking your health care provider for more information and resources. Taking classes to learn more about quitting smoking. Looking for local organizations that  offer resources about quitting smoking. Joining a support group for people who want to quit smoking in your local community. Calling the smokefree.gov counselor helpline: 1-800-Quit-Now (1-800-784-8669) Where to find more information You may find more information about quitting smoking from: HelpGuide.org: www.helpguide.org Smokefree.gov: smokefree.gov American Lung Association: www.lung.org Contact a health care provider if you: Have problems breathing. Notice that your lips, nose, or fingers turn blue. Have chest pain. Are coughing up blood. Feel faint or you pass out. Have other health changes that cause you to worry. Summary Smoking tobacco can negatively affect your health, the health of those around you, your finances, and your social life. Do not start smoking. Quit if you already do. If you need help quitting, ask your health care provider. Think about joining a support group for people who want to quit smoking in your local community. There are many effective programs that will help you to quit this behavior. This information is not intended to replace advice given to you by your health care provider. Make sure you discuss any questions you have with your health care provider. Document Revised: 12/17/2020 Document Reviewed: 03/06/2020 Elsevier Patient Education  2022 Elsevier Inc.  

## 2021-07-18 NOTE — Progress Notes (Signed)
? ? [place] ?[phone]   [phone] ? ?CONSULT NOTE ? ?REFERRING PHYSICIAN: Dr. Melodie Bouillon ? ?REASON FOR CONSULTATION:  ?61 years old African-American male with suspicious lung nodule ? ?HPI ?Daniel [name] is a 61 y.o. male with past medical history significant for multiple medical problems including history of end-stage renal disease and currently on hemodialysis on Monday, Wednesday and Friday, anemia, diabetes mellitus, intracranial hemorrhage, peripheral arterial disease status post left below knee amputation, hypertension as well as AV malformation.  The patient was complaining of shortness of breath at his skilled nursing facility and he was seen at the emergency department on 07/04/2021.  He had CT angiogram of the chest performed at that time to rule out pulmonary embolism.  The scan showed no evidence of pulmonary embolism to the lobar branch level.  Incidentally it showed rounded 2.2 cm subsolid density within the superior aspect of the right lower lobe that was present on previous CT scan from 2021 and demonstrate increased solid component on the current scan.  Adenocarcinoma was a consideration.  The patient also has mildly prominent mediastinal and right hilar lymph nodes that are nonspecific and likely to be reactive in nature.  Cardiothoracic surgery was consulted and Dr. Kipp Brood ordered a PET scan which is expected to be done on July 23, 2021. ?He also referred the patient to me today for evaluation and recommendation regarding his condition. ?When seen today the patient is feeling fine except for bilateral chest pain but he has no shortness of breath.  He continues to have mild cough.  He has no nausea, vomiting, diarrhea or constipation.  He has no headache or visual changes. ?Family history significant for father who died from alcoholic complication and mother had cancer of unknown type. ?The patient is single and has 2 children a son and  daughter.  His son lives in [place] and daughter in [place].  He used to work as a Administrator.  He has a history of smoking 1 pack/day for around 45 years and unfortunately he continues to smoke.  He has no history of alcohol or drug abuse. ? ?HPI ? ?Past Medical History:  ?Diagnosis Date  ? Anemia   ? Diabetes mellitus without complication ([place])   ? patient denies  ? Dialysis patient [facility])   ? End stage chronic kidney disease ([place])   ? Hypertension   ? ICH (intracerebral hemorrhage) ([place]) 05/20/2017  ? PAD (peripheral artery disease) ([place])   ? Shoulder pain, left 06/28/2013  ? ? ?Past Surgical History:  ?Procedure Laterality Date  ? A/V FISTULAGRAM N/A 08/15/2020  ? Procedure: A/V FISTULAGRAM - Left Upper;  Surgeon: Cherre Robins, MD;  Location: [place] [facility];  Service: Cardiovascular;  Laterality: N/A;  ? ABDOMINAL AORTOGRAM W/LOWER EXTREMITY N/A 04/08/2021  ? Procedure: ABDOMINAL AORTOGRAM W/LOWER EXTREMITY;  Surgeon: Waynetta Sandy, MD;  Location: [place] [facility];  Service: Cardiovascular;  Laterality: N/A;  ? AMPUTATION Left 04/16/2021  ? Procedure: LEFT BELOW KNEE AMPUTATION;  Surgeon: Angelia Mould, MD;  Location: [facility] OR;  Service: Vascular;  Laterality: Left;  ? AMPUTATION Left 06/17/2021  ? Procedure: REVISION AMPUTATION BELOW KNEE;  Surgeon: Broadus John, MD;  Location: [place];  Service: Vascular;  Laterality: Left;  ? APPENDECTOMY    ? APPLICATION OF WOUND VAC Left 06/17/2021  ? Procedure: APPLICATION OF WOUND VAC;  Surgeon: Broadus John, MD;  Location: [place];  Service: Vascular;  Laterality: Left;  ?  AV FISTULA PLACEMENT Left 08/03/2019  ? Procedure: LEFT ARM ARTERIOVENOUS (AV) CEPHALIC  FISTULA CREATION;  Surgeon: Waynetta Sandy, MD;  Location: [place];  Service: Vascular;  Laterality: Left;  ? BIOPSY  06/30/2019  ? Procedure: BIOPSY;  Surgeon: Ronald Lobo, MD;  Location: [place];  Service: Endoscopy;;  ? BIOPSY  08/02/2019  ? Procedure: BIOPSY;   Surgeon: Yetta Flock, MD;  Location: [facility] ENDOSCOPY;  Service: Gastroenterology;;  ? BIOPSY  02/08/2021  ? Procedure: BIOPSY;  Surgeon: Sharyn Creamer, MD;  Location: [facility] ENDOSCOPY;  Service: Gastroenterology;;  ? BIOPSY  02/24/2021  ? Procedure: BIOPSY;  Surgeon: [name], MD;  Location: [facility] ENDOSCOPY;  Service: Gastroenterology;;  ? BIOPSY  03/26/2021  ? Procedure: BIOPSY;  Surgeon: Lavena Bullion, DO;  Location: [place];  Service: Gastroenterology;;  ? COLONOSCOPY  01/23/2012  ? Procedure: COLONOSCOPY;  Surgeon: Danie Binder, MD;  [facility];  Service: Endoscopy;  Laterality: N/A;  11:10 AM  ? COLONOSCOPY WITH PROPOFOL N/A 06/30/2019  ? Procedure: COLONOSCOPY WITH PROPOFOL;  Surgeon: Ronald Lobo, MD;  Location: [place];  Service: Endoscopy;  Laterality: N/A;  ? ENTEROSCOPY N/A 08/02/2019  ? Procedure: ENTEROSCOPY;  Surgeon: Yetta Flock, MD;  Location: [facility] ENDOSCOPY;  Service: Gastroenterology;  Laterality: N/A;  ? ENTEROSCOPY N/A 02/24/2021  ? Procedure: ENTEROSCOPY;  Surgeon: [name], MD;  Location: [place];  Service: Gastroenterology;  Laterality: N/A;  ? ENTEROSCOPY N/A 03/26/2021  ? Procedure: ENTEROSCOPY;  Surgeon: Lavena Bullion, DO;  Location: [place];  Service: Gastroenterology;  Laterality: N/A;  ? ENTEROSCOPY N/A 07/05/2021  ? Procedure: ENTEROSCOPY;  Surgeon: Carol Ada, MD;  Location: [place];  Service: Gastroenterology;  Laterality: N/A;  ? ESOPHAGOGASTRODUODENOSCOPY N/A 08/10/2020  ? Procedure: ESOPHAGOGASTRODUODENOSCOPY (EGD);  Surgeon: Jerene Bears, MD;  Location: [facility] ENDOSCOPY;  Service: Gastroenterology;  Laterality: N/A;  ? ESOPHAGOGASTRODUODENOSCOPY (EGD) WITH PROPOFOL N/A 06/30/2019  ? Procedure: ESOPHAGOGASTRODUODENOSCOPY (EGD) WITH PROPOFOL;  Surgeon: Ronald Lobo, MD;  Location: [place];  Service: Endoscopy;  Laterality: N/A;  ? ESOPHAGOGASTRODUODENOSCOPY (EGD) WITH PROPOFOL N/A 01/12/2021  ?  Procedure: ESOPHAGOGASTRODUODENOSCOPY (EGD) WITH PROPOFOL;  Surgeon: Sharyn Creamer, MD;  Location: [place];  Service: Gastroenterology;  Laterality: N/A;  ? ESOPHAGOGASTRODUODENOSCOPY (EGD) WITH PROPOFOL N/A 02/08/2021  ? Procedure: ESOPHAGOGASTRODUODENOSCOPY (EGD) WITH PROPOFOL;  Surgeon: Sharyn Creamer, MD;  Location: [place];  Service: Gastroenterology;  Laterality: N/A;  ? FISTULA SUPERFICIALIZATION Left 10/17/2019  ? Procedure: LEFT UPPER EXTREMITY FISTULA REVISION, SIDE BRANCH LIGATION,  AND SUPERFICIALIZATION;  Surgeon: Marty Heck, MD;  Location: [place];  Service: Vascular;  Laterality: Left;  ? FISTULA SUPERFICIALIZATION Left 04/19/2021  ? Procedure: PLICATION OF ANEURYSM LEFT ARTERIOVENOUS FISTULA;  Surgeon: Angelia Mould, MD;  Location: [place];  Service: Vascular;  Laterality: Left;  ? FLEXIBLE SIGMOIDOSCOPY N/A 07/05/2021  ? Procedure: FLEXIBLE SIGMOIDOSCOPY;  Surgeon: Carol Ada, MD;  Location: [place];  Service: Gastroenterology;  Laterality: N/A;  ? GIVENS CAPSULE STUDY N/A 06/30/2019  ? Procedure: GIVENS CAPSULE STUDY;  Surgeon: Ronald Lobo, MD;  Location: [facility] ENDOSCOPY;  Service: Endoscopy;  Laterality: N/A;  ? HEMOSTASIS CLIP PLACEMENT  08/10/2020  ? Procedure: HEMOSTASIS CLIP PLACEMENT;  Surgeon: Jerene Bears, MD;  Location: [facility] ENDOSCOPY;  Service: Gastroenterology;;  ? HEMOSTASIS CLIP PLACEMENT  01/12/2021  ? Procedure: HEMOSTASIS CLIP PLACEMENT;  Surgeon: Sharyn Creamer, MD;  Location: [place];  Service: Gastroenterology;;  ? HEMOSTASIS CONTROL  08/02/2019  ? Procedure: HEMOSTASIS  CONTROL;  Surgeon: Yetta Flock, MD;  Location: [facility] ENDOSCOPY;  Service: Gastroenterology;;  ? HOT HEMOSTASIS  02/24/2021  ? Procedure: HOT HEMOSTASIS (ARGON PLASMA COAGULATION/BICAP);  Surgeon: [name], MD;  Location: [facility] ENDOSCOPY;  Service: Gastroenterology;;  ? HOT HEMOSTASIS N/A 03/26/2021  ? Procedure: HOT HEMOSTASIS (ARGON PLASMA COAGULATION/BICAP);  Surgeon:  Lavena Bullion, DO;  Location: [facility] ENDOSCOPY;  Service: Gastroenterology;  Laterality: N/A;  ? HOT HEMOSTASIS N/A 07/05/2021  ? Procedure: HOT HEMOSTASIS (ARGON PLASMA COAGULATION/BICAP);  Surgeon: Carol Ada, MD

## 2021-07-19 ENCOUNTER — Encounter: Payer: Self-pay | Admitting: *Deleted

## 2021-07-19 NOTE — Progress Notes (Signed)
Oncology Nurse Navigator Documentation ? ? ?  07/19/2021  ?  2:00 PM 07/15/2021  ?  5:00 PM 07/12/2021  ?  9:00 AM  ?Oncology Nurse Navigator Flowsheets  ?Navigator Follow Up Date:   07/18/2021  ?Navigator Follow Up Reason:   New Patient Appointment  ?Navigator Location [facility]  ?Navigator Encounter Type Appt/Treatment Plan Review Telephone Telephone  ?Telephone  Outgoing Call [place] Call  ?Treatment Phase Abnormal Scans    ?Barriers/Navigation Needs Coordination of Care/I followed up on Mr. Omalley schedule he is set up for pet scan, pft's and thoracic surgery at this time.   Coordination of Care Coordination of Care  ?Interventions Coordination of Care Coordination of Care Coordination of Care  ?Acuity Level 2-Minimal Needs (1-2 Barriers Identified) Level 2-Minimal Needs (1-2 Barriers Identified) Level 2-Minimal Needs (1-2 Barriers Identified)  ?Time Spent with Patient '30 30 15  '$ ?  ?

## 2021-07-23 ENCOUNTER — Ambulatory Visit (HOSPITAL_COMMUNITY)
Admission: RE | Admit: 2021-07-23 | Discharge: 2021-07-23 | Disposition: A | Discharge status: Home / Self Care | Payer: No Typology Code available for payment source | Source: Ambulatory Visit | Attending: Thoracic Surgery (Cardiothoracic Vascular Surgery) | Admitting: Thoracic Surgery (Cardiothoracic Vascular Surgery)

## 2021-07-23 ENCOUNTER — Other Ambulatory Visit: Payer: Self-pay

## 2021-07-23 DIAGNOSIS — K769 Liver disease, unspecified: Secondary | ICD-10-CM | POA: Insufficient documentation

## 2021-07-23 DIAGNOSIS — R911 Solitary pulmonary nodule: Secondary | ICD-10-CM | POA: Diagnosis present

## 2021-07-23 DIAGNOSIS — F17210 Nicotine dependence, cigarettes, uncomplicated: Secondary | ICD-10-CM | POA: Diagnosis not present

## 2021-07-23 DIAGNOSIS — R0609 Other forms of dyspnea: Secondary | ICD-10-CM | POA: Insufficient documentation

## 2021-07-23 DIAGNOSIS — J9811 Atelectasis: Secondary | ICD-10-CM | POA: Insufficient documentation

## 2021-07-23 LAB — PULMONARY FUNCTION TEST
DL/VA % pred: 87 %
DL/VA: 3.69 ml/min/mmHg/L
DLCO cor % pred: 52 %
DLCO cor: 14.75 ml/min/mmHg
DLCO unc % pred: 39 %
DLCO unc: 11.16 ml/min/mmHg
FEF 25-75 Post: 4.1 L/sec
FEF 25-75 Pre: 2.4 L/sec
FEF2575-%Change-Post: 70 %
FEF2575-%Pred-Post: 138 %
FEF2575-%Pred-Pre: 80 %
FEV1-%Change-Post: 16 %
FEV1-%Pred-Post: 64 %
FEV1-%Pred-Pre: 55 %
FEV1-Post: 2.07 L
FEV1-Pre: 1.78 L
FEV1FVC-%Change-Post: 0 %
FEV1FVC-%Pred-Pre: 109 %
FEV6-%Change-Post: 15 %
FEV6-%Pred-Post: 60 %
FEV6-%Pred-Pre: 52 %
FEV6-Post: 2.4 L
FEV6-Pre: 2.08 L
FEV6FVC-%Pred-Post: 103 %
FEV6FVC-%Pred-Pre: 103 %
FVC-%Change-Post: 15 %
FVC-%Pred-Post: 57 %
FVC-%Pred-Pre: 50 %
FVC-Post: 2.4 L
FVC-Pre: 2.08 L
Post FEV1/FVC ratio: 86 %
Post FEV6/FVC ratio: 100 %
Pre FEV1/FVC ratio: 85 %
Pre FEV6/FVC Ratio: 100 %
RV % pred: 75 %
RV: 1.75 L
TLC % pred: 57 %
TLC: 4.14 L

## 2021-07-23 LAB — GLUCOSE, CAPILLARY: Glucose-Capillary: 94 mg/dL (ref 70–99)

## 2021-07-23 MED ORDER — ALBUTEROL SULFATE (2.5 MG/3ML) 0.083% IN NEBU
2.5000 mg | INHALATION_SOLUTION | Freq: Once | RESPIRATORY_TRACT | Status: AC
  Administered 2021-07-23: 2.5 mg via RESPIRATORY_TRACT

## 2021-07-23 MED ORDER — FLUDEOXYGLUCOSE F - 18 (FDG) INJECTION
12.5300 | Freq: Once | INTRAVENOUS | Status: AC | PRN
  Administered 2021-07-23: 12.53 via INTRAVENOUS

## 2021-07-25 ENCOUNTER — Encounter: Payer: Medicare Other | Admitting: Thoracic Surgery (Cardiothoracic Vascular Surgery)

## 2021-07-25 ENCOUNTER — Encounter: Payer: Self-pay | Admitting: *Deleted

## 2021-07-25 DIAGNOSIS — R911 Solitary pulmonary nodule: Secondary | ICD-10-CM

## 2021-07-26 ENCOUNTER — Encounter: Payer: Self-pay | Admitting: *Deleted

## 2021-07-26 ENCOUNTER — Encounter: Payer: Medicare Other | Admitting: Thoracic Surgery (Cardiothoracic Vascular Surgery)

## 2021-07-26 NOTE — Progress Notes (Signed)
Oncology Nurse Navigator Documentation ? ? ?  07/26/2021  ?  4:00 PM 07/19/2021  ?  2:00 PM 07/15/2021  ?  5:00 PM 07/12/2021  ?  9:00 AM  ?Oncology Nurse Navigator Flowsheets  ?Navigator Follow Up Date:    07/18/2021  ?Navigator Follow Up Reason:    New Patient Appointment  ?Navigator Location [facility]  ?Navigator Encounter Type Other: Appt/Treatment Plan Review Telephone Telephone  ?Telephone   Outgoing Call [place] Call  ?Treatment Phase Abnormal Scans Abnormal Scans    ?Barriers/Navigation Needs Coordination of Care Coordination of Care Coordination of Care Coordination of Care  ?Interventions Coordination of Care/I received a message from Dr. Abran Duke office that he has reviewed the re-referral to his office and he wants the patient to see pulmonary.  There is a referral for pulmonary.  I did update Dr. Julien Nordmann.  Coordination of Care Coordination of Care Coordination of Care  ?Acuity Level 2-Minimal Needs (1-2 Barriers Identified) Level 2-Minimal Needs (1-2 Barriers Identified) Level 2-Minimal Needs (1-2 Barriers Identified) Level 2-Minimal Needs (1-2 Barriers Identified)  ?Coordination of Care Other     ?Time Spent with Patient '30 30 30 15  '$ ?  ?

## 2021-07-30 ENCOUNTER — Encounter: Payer: Self-pay | Admitting: *Deleted

## 2021-07-30 NOTE — Progress Notes (Signed)
Oncology Nurse Navigator Documentation ? ? ?  07/30/2021  ? 10:00 AM 07/26/2021  ?  4:00 PM 07/19/2021  ?  2:00 PM 07/15/2021  ?  5:00 PM 07/12/2021  ?  9:00 AM  ?Oncology Nurse Navigator Flowsheets  ?Navigator Follow Up Date:     07/18/2021  ?Navigator Follow Up Reason:     New Patient Appointment  ?Navigation Complete Date: 07/30/2021      ?Post Navigation: Continue to Follow Patient? No      ?Reason Not Navigating Patient: No Cancer Diagnosis      ?Navigator Location [facility]  ?Navigator Encounter Type Appt/Treatment Plan Review Other: Appt/Treatment Plan Review Telephone Telephone  ?Telephone    Outgoing Call [place] Call  ?Treatment Phase Abnormal Scans Abnormal Scans Abnormal Scans    ?Barriers/Navigation Needs Coordination of Care Coordination of Care Coordination of Care Coordination of Care Coordination of Care  ?Interventions Coordination of Care/per providers information, patient does not need to see oncology just pulmonary.  Patient has an appt this week with Dr. Valeta Harms pulmonary.  No oncology navigation needs.  Coordination of Care Coordination of Care Coordination of Care Coordination of Care  ?Acuity Level 2-Minimal Needs (1-2 Barriers Identified) Level 2-Minimal Needs (1-2 Barriers Identified) Level 2-Minimal Needs (1-2 Barriers Identified) Level 2-Minimal Needs (1-2 Barriers Identified) Level 2-Minimal Needs (1-2 Barriers Identified)  ?Coordination of Care Other Other     ?Time Spent with Patient '30 30 30 30 15  '$ ?  ?

## 2021-08-01 ENCOUNTER — Encounter: Payer: Self-pay | Admitting: Pulmonary Disease

## 2021-08-01 ENCOUNTER — Ambulatory Visit (INDEPENDENT_AMBULATORY_CARE_PROVIDER_SITE_OTHER): Payer: Medicare Other | Admitting: Pulmonary Disease

## 2021-08-01 VITALS — BP 106/70 | HR 88 | Temp 98.2°F | Ht 71.0 in | Wt 250.0 lb

## 2021-08-01 DIAGNOSIS — Z992 Dependence on renal dialysis: Secondary | ICD-10-CM

## 2021-08-01 DIAGNOSIS — R911 Solitary pulmonary nodule: Secondary | ICD-10-CM

## 2021-08-01 DIAGNOSIS — R918 Other nonspecific abnormal finding of lung field: Secondary | ICD-10-CM

## 2021-08-01 DIAGNOSIS — Z720 Tobacco use: Secondary | ICD-10-CM

## 2021-08-01 DIAGNOSIS — N186 End stage renal disease: Secondary | ICD-10-CM

## 2021-08-01 DIAGNOSIS — Z89512 Acquired absence of left leg below knee: Secondary | ICD-10-CM | POA: Diagnosis not present

## 2021-08-01 DIAGNOSIS — R071 Chest pain on breathing: Secondary | ICD-10-CM | POA: Diagnosis not present

## 2021-08-01 DIAGNOSIS — R160 Hepatomegaly, not elsewhere classified: Secondary | ICD-10-CM

## 2021-08-01 NOTE — Progress Notes (Signed)
? ?Synopsis: Referred in April 2023 for groundglass lung nodule by [name], NP ? ?Subjective:  ? ?PATIENT ID: Daniel [name] GENDER: male DOB: 10-08-1960, MRN: [id number] ? ?Chief Complaint  ?Patient presents with  ? Consult  ?  Consult.   ? ? 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  He is now was discharged to a skilled nursing facility.  He has plans to be discharged home soon.  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  Patient was referred to medical oncology upon discharge.  He also had a PET scan that was complete in this process.  The PET scan revealed hypermetabolic uptake within some peripheral atelectasis of the lung which was actually has a relatively high SUV.  The lesion in the lower lobe of the lung was not well identified on the PET scan.  Could not rule out whether or not we have a indolent malignancy versus inflammatory lesion.  Unfortunately the patient is still smoking and he has smoked for many years since he was a teenager.  Still smoking half pack a day. ? ? ?Past Medical History:  ?Diagnosis Date  ? Anemia   ? Diabetes mellitus without complication ([place])   ? patient denies  ? Dialysis patient [facility])   ? End stage chronic kidney disease ([place])   ? Hypertension   ? ICH (intracerebral hemorrhage) ([place]) 05/20/2017  ? PAD (peripheral artery disease) ([place])   ? Shoulder pain, left 06/28/2013  ?  ? ?Family History  ?Problem Relation Age of Onset  ? Colon cancer Neg Hx   ?  ? ?Past Surgical History:  ?Procedure Laterality Date  ? A/V FISTULAGRAM N/A 08/15/2020  ? Procedure: A/V FISTULAGRAM - Left Upper;  Surgeon: Cherre Robins, MD;  Location: [place] CV [facility];  Service: Cardiovascular;   Laterality: N/A;  ? ABDOMINAL AORTOGRAM W/LOWER EXTREMITY N/A 04/08/2021  ? Procedure: ABDOMINAL AORTOGRAM W/LOWER EXTREMITY;  Surgeon: Waynetta Sandy, MD;  Location: [place] CV [facility];  Service: Cardiovascular;  Laterality: N/A;  ? AMPUTATION Left 04/16/2021  ? Procedure: LEFT BELOW KNEE AMPUTATION;  Surgeon: Angelia Mould, MD;  Location: [facility] OR;  Service: Vascular;  Laterality: Left;  ? AMPUTATION Left 06/17/2021  ? Procedure: REVISION AMPUTATION BELOW KNEE;  Surgeon: Broadus John, MD;  Location: [place];  Service: Vascular;  Laterality: Left;  ? APPENDECTOMY    ? APPLICATION OF WOUND VAC Left 06/17/2021  ? Procedure: APPLICATION OF WOUND VAC;  Surgeon: Broadus John, MD;  Location: [place];  Service: Vascular;  Laterality: Left;  ? AV FISTULA PLACEMENT Left 08/03/2019  ? Procedure: LEFT ARM ARTERIOVENOUS (AV) CEPHALIC  FISTULA CREATION;  Surgeon: Waynetta Sandy, MD;  Location: [place];  Service: Vascular;  Laterality: Left;  ? BIOPSY  06/30/2019  ? Procedure: BIOPSY;  Surgeon: Ronald Lobo, MD;  Location: [place];  Service: Endoscopy;;  ? BIOPSY  08/02/2019  ? Procedure: BIOPSY;  Surgeon: Yetta Flock, MD;  Location: [facility] ENDOSCOPY;  Service: Gastroenterology;;  ? BIOPSY  02/08/2021  ? Procedure: BIOPSY;  Surgeon: Sharyn Creamer, MD;  Location: [facility] ENDOSCOPY;  Service: Gastroenterology;;  ? BIOPSY  02/24/2021  ? Procedure: BIOPSY;  Surgeon: [name], MD;  Location: [facility]  ENDOSCOPY;  Service: Gastroenterology;;  ? BIOPSY  03/26/2021  ? Procedure: BIOPSY;  Surgeon: Lavena Bullion, DO;  Location: [place];  Service: Gastroenterology;;  ? COLONOSCOPY  01/23/2012  ? Procedure: COLONOSCOPY;  Surgeon: Danie Binder, MD;  [facility];  Service: Endoscopy;  Laterality: N/A;  11:10 AM  ? COLONOSCOPY WITH PROPOFOL N/A 06/30/2019  ? Procedure: COLONOSCOPY WITH PROPOFOL;  Surgeon: Ronald Lobo, MD;  Location: [place];  Service: Endoscopy;  Laterality: N/A;   ? ENTEROSCOPY N/A 08/02/2019  ? Procedure: ENTEROSCOPY;  Surgeon: Yetta Flock, MD;  Location[facility]COPY;  Service: Gastroenterology;  Laterality: N/A;  ? ENTEROSCOPY N/A 02/24/2021  ? Procedure: ENTEROSCOPY;  Surgeon: [name], MD;  Location: [place];  Service: Gastroenterology;  Laterality: N/A;  ? ENTEROSCOPY N/A 03/26/2021  ? Procedure: ENTEROSCOPY;  Surgeon: Lavena Bullion, DO;  Location: [place];  Service: Gastroenterology;  Laterality: N/A;  ? ENTEROSCOPY N/A 07/05/2021  ? Procedure: ENTEROSCOPY;  Surgeon: Carol Ada, MD;  Location: [place];  Service: Gastroenterology;  Laterality: N/A;  ? ESOPHAGOGASTRODUODENOSCOPY N/A 08/10/2020  ? Procedure: ESOPHAGOGASTRODUODENOSCOPY (EGD);  Surgeon: Jerene Bears, MD;  Location: [facility] ENDOSCOPY;  Service: Gastroenterology;  Laterality: N/A;  ? ESOPHAGOGASTRODUODENOSCOPY (EGD) WITH PROPOFOL N/A 06/30/2019  ? Procedure: ESOPHAGOGASTRODUODENOSCOPY (EGD) WITH PROPOFOL;  Surgeon: Ronald Lobo, MD;  Location: [place];  Service: Endoscopy;  Laterality: N/A;  ? ESOPHAGOGASTRODUODENOSCOPY (EGD) WITH PROPOFOL N/A 01/12/2021  ? Procedure: ESOPHAGOGASTRODUODENOSCOPY (EGD) WITH PROPOFOL;  Surgeon: Sharyn Creamer, MD;  Location: [place];  Service: Gastroenterology;  Laterality: N/A;  ? ESOPHAGOGASTRODUODENOSCOPY (EGD) WITH PROPOFOL N/A 02/08/2021  ? Procedure: ESOPHAGOGASTRODUODENOSCOPY (EGD) WITH PROPOFOL;  Surgeon: Sharyn Creamer, MD;  Location: [place];  Service: Gastroenterology;  Laterality: N/A;  ? FISTULA SUPERFICIALIZATION Left 10/17/2019  ? Procedure: LEFT UPPER EXTREMITY FISTULA REVISION, SIDE BRANCH LIGATION,  AND SUPERFICIALIZATION;  Surgeon: Marty Heck, MD;  Location: [place];  Service: Vascular;  Laterality: Left;  ? FISTULA SUPERFICIALIZATION Left 04/19/2021  ? Procedure: PLICATION OF ANEURYSM LEFT ARTERIOVENOUS FISTULA;  Surgeon: Angelia Mould, MD;  Location: [place];  Service: Vascular;  Laterality:  Left;  ? FLEXIBLE SIGMOIDOSCOPY N/A 07/05/2021  ? Procedure: FLEXIBLE SIGMOIDOSCOPY;  Surgeon: Carol Ada, MD;  Location: [place];  Service: Gastroenterology;  Laterality: N/A;  ? GIVENS CAPSULE STUDY N/A 06/30/2019  ? Procedure: GIVENS CAPSULE STUDY;  Surgeon: Ronald Lobo, MD;  Location: [facility] ENDOSCOPY;  Service: Endoscopy;  Laterality: N/A;  ? HEMOSTASIS CLIP PLACEMENT  08/10/2020  ? Procedure: HEMOSTASIS CLIP PLACEMENT;  Surgeon: Jerene Bears, MD;  Location: [facility] ENDOSCOPY;  Service: Gastroenterology;;  ? HEMOSTASIS CLIP PLACEMENT  01/12/2021  ? Procedure: HEMOSTASIS CLIP PLACEMENT;  Surgeon: Sharyn Creamer, MD;  Location: [place];  Service: Gastroenterology;;  ? HEMOSTASIS CONTROL  08/02/2019  ? Procedure: HEMOSTASIS CONTROL;  Surgeon: Yetta Flock, MD;  Location: [place];  Service: Gastroenterology;;  ? HOT HEMOSTASIS  02/24/2021  ? Procedure: HOT HEMOSTASIS (ARGON PLASMA COAGULATION/BICAP);  Surgeon: [name], MD;  Location: [facility] ENDOSCOPY;  Service: Gastroenterology;;  ? HOT HEMOSTASIS N/A 03/26/2021  ? Procedure: HOT HEMOSTASIS (ARGON PLASMA COAGULATION/BICAP);  Surgeon: Lavena Bullion, DO;  Location: [facility] ENDOSCOPY;  Service: Gastroenterology;  Laterality: N/A;  ? HOT HEMOSTASIS N/A 07/05/2021  ? Procedure: HOT HEMOSTASIS (ARGON PLASMA COAGULATION/BICAP);  Surgeon: Carol Ada, MD;  Location: [place];  Service: Gastroenterology;  Laterality: N/A;  ? INCISION AND DRAINAGE ABSCESS N/A 06/29/2016  ? Procedure: INCISION AND DRAINAGE ABDOMINAL  WALL ABSCESS;  Surgeon: Alphonsa Overall, MD;  [facility];  Service: General;  Laterality: N/A;  ? INSERTION OF DIALYSIS CATHETER Right 08/03/2019  ? Procedure: INSERTION OF DIALYSIS CATHETER;  Surgeon: Waynetta Sandy, MD;  Location: [place];  Service: Vascular;  Laterality: Right;  ? INSERTION OF DIALYSIS CATHETER Right 10/22/2019  ? Procedure: INSERTION OF 23CM TUNNELED DIALYSIS CATHETER RIGHT INTERNAL JUGULAR;  Surgeon:  Angelia Mould, MD;  Location: [facility] OR;  Service: Vascular;  Laterality: Right;  ? INSERTION OF DIALYSIS CATHETER Right 08/12/2020  ? Procedure: INSERTION OF Right internal Jugular TUNNELED  DIALYSIS CATHET

## 2021-08-01 NOTE — Patient Instructions (Signed)
Thank you for visiting Dr. Valeta Harms at [facility]. ?Today we recommend the following: ? ?Orders Placed This Encounter  ?Procedures  ? CT CHEST HIGH RESOLUTION  ? ?Please see [place] after your ct chest complete  ? ?Return in about 6 weeks (around 09/10/2021) for with Eric Form, NP, or Dr. Valeta Harms. ? ? ? ?Please do your part to reduce the spread of COVID-19.  ? ?

## 2021-08-05 ENCOUNTER — Inpatient Hospital Stay (HOSPITAL_COMMUNITY)
Admission: EM | Admit: 2021-08-05 | Discharge: 2021-08-10 | DRG: 377 | Disposition: A | Discharge status: Home Health | Payer: Medicare Other | Attending: Internal Medicine | Admitting: Internal Medicine

## 2021-08-05 ENCOUNTER — Other Ambulatory Visit: Payer: Self-pay

## 2021-08-05 ENCOUNTER — Inpatient Hospital Stay (HOSPITAL_COMMUNITY): Payer: Medicare Other

## 2021-08-05 ENCOUNTER — Encounter (HOSPITAL_COMMUNITY): Payer: Self-pay | Admitting: *Deleted

## 2021-08-05 DIAGNOSIS — Z8673 Personal history of transient ischemic attack (TIA), and cerebral infarction without residual deficits: Secondary | ICD-10-CM | POA: Diagnosis not present

## 2021-08-05 DIAGNOSIS — A048 Other specified bacterial intestinal infections: Secondary | ICD-10-CM

## 2021-08-05 DIAGNOSIS — K317 Polyp of stomach and duodenum: Secondary | ICD-10-CM | POA: Diagnosis present

## 2021-08-05 DIAGNOSIS — B3781 Candidal esophagitis: Secondary | ICD-10-CM | POA: Diagnosis present

## 2021-08-05 DIAGNOSIS — K7460 Unspecified cirrhosis of liver: Secondary | ICD-10-CM | POA: Diagnosis present

## 2021-08-05 DIAGNOSIS — C220 Liver cell carcinoma: Secondary | ICD-10-CM | POA: Diagnosis present

## 2021-08-05 DIAGNOSIS — K921 Melena: Secondary | ICD-10-CM | POA: Diagnosis present

## 2021-08-05 DIAGNOSIS — I120 Hypertensive chronic kidney disease with stage 5 chronic kidney disease or end stage renal disease: Secondary | ICD-10-CM | POA: Diagnosis present

## 2021-08-05 DIAGNOSIS — D62 Acute posthemorrhagic anemia: Secondary | ICD-10-CM | POA: Diagnosis present

## 2021-08-05 DIAGNOSIS — E1122 Type 2 diabetes mellitus with diabetic chronic kidney disease: Secondary | ICD-10-CM | POA: Diagnosis present

## 2021-08-05 DIAGNOSIS — K31811 Angiodysplasia of stomach and duodenum with bleeding: Secondary | ICD-10-CM

## 2021-08-05 DIAGNOSIS — K219 Gastro-esophageal reflux disease without esophagitis: Secondary | ICD-10-CM | POA: Diagnosis present

## 2021-08-05 DIAGNOSIS — D631 Anemia in chronic kidney disease: Secondary | ICD-10-CM | POA: Diagnosis present

## 2021-08-05 DIAGNOSIS — E669 Obesity, unspecified: Secondary | ICD-10-CM | POA: Diagnosis present

## 2021-08-05 DIAGNOSIS — N2581 Secondary hyperparathyroidism of renal origin: Secondary | ICD-10-CM | POA: Diagnosis present

## 2021-08-05 DIAGNOSIS — M7731 Calcaneal spur, right foot: Secondary | ICD-10-CM | POA: Diagnosis present

## 2021-08-05 DIAGNOSIS — Z6836 Body mass index (BMI) 36.0-36.9, adult: Secondary | ICD-10-CM

## 2021-08-05 DIAGNOSIS — E1151 Type 2 diabetes mellitus with diabetic peripheral angiopathy without gangrene: Secondary | ICD-10-CM | POA: Diagnosis present

## 2021-08-05 DIAGNOSIS — F17210 Nicotine dependence, cigarettes, uncomplicated: Secondary | ICD-10-CM | POA: Diagnosis present

## 2021-08-05 DIAGNOSIS — K769 Liver disease, unspecified: Secondary | ICD-10-CM | POA: Diagnosis present

## 2021-08-05 DIAGNOSIS — R911 Solitary pulmonary nodule: Secondary | ICD-10-CM | POA: Diagnosis present

## 2021-08-05 DIAGNOSIS — Z79899 Other long term (current) drug therapy: Secondary | ICD-10-CM

## 2021-08-05 DIAGNOSIS — K922 Gastrointestinal hemorrhage, unspecified: Secondary | ICD-10-CM | POA: Diagnosis present

## 2021-08-05 DIAGNOSIS — M898X9 Other specified disorders of bone, unspecified site: Secondary | ICD-10-CM | POA: Diagnosis present

## 2021-08-05 DIAGNOSIS — K2971 Gastritis, unspecified, with bleeding: Principal | ICD-10-CM | POA: Diagnosis present

## 2021-08-05 DIAGNOSIS — Q2730 Arteriovenous malformation, site unspecified: Secondary | ICD-10-CM | POA: Diagnosis not present

## 2021-08-05 DIAGNOSIS — K2970 Gastritis, unspecified, without bleeding: Secondary | ICD-10-CM | POA: Diagnosis not present

## 2021-08-05 DIAGNOSIS — K2210 Ulcer of esophagus without bleeding: Secondary | ICD-10-CM | POA: Diagnosis present

## 2021-08-05 DIAGNOSIS — E8770 Fluid overload, unspecified: Secondary | ICD-10-CM | POA: Diagnosis present

## 2021-08-05 DIAGNOSIS — R918 Other nonspecific abnormal finding of lung field: Secondary | ICD-10-CM | POA: Diagnosis present

## 2021-08-05 DIAGNOSIS — D6959 Other secondary thrombocytopenia: Secondary | ICD-10-CM | POA: Diagnosis present

## 2021-08-05 DIAGNOSIS — N186 End stage renal disease: Secondary | ICD-10-CM | POA: Diagnosis present

## 2021-08-05 DIAGNOSIS — Z89512 Acquired absence of left leg below knee: Secondary | ICD-10-CM | POA: Diagnosis not present

## 2021-08-05 DIAGNOSIS — Z7982 Long term (current) use of aspirin: Secondary | ICD-10-CM

## 2021-08-05 DIAGNOSIS — Z95820 Peripheral vascular angioplasty status with implants and grafts: Secondary | ICD-10-CM | POA: Diagnosis not present

## 2021-08-05 DIAGNOSIS — K208 Other esophagitis without bleeding: Secondary | ICD-10-CM | POA: Diagnosis present

## 2021-08-05 DIAGNOSIS — D649 Anemia, unspecified: Secondary | ICD-10-CM | POA: Diagnosis present

## 2021-08-05 DIAGNOSIS — Z992 Dependence on renal dialysis: Secondary | ICD-10-CM

## 2021-08-05 DIAGNOSIS — Z7902 Long term (current) use of antithrombotics/antiplatelets: Secondary | ICD-10-CM

## 2021-08-05 DIAGNOSIS — M79671 Pain in right foot: Secondary | ICD-10-CM | POA: Diagnosis not present

## 2021-08-05 DIAGNOSIS — K3189 Other diseases of stomach and duodenum: Secondary | ICD-10-CM | POA: Diagnosis not present

## 2021-08-05 DIAGNOSIS — B182 Chronic viral hepatitis C: Secondary | ICD-10-CM | POA: Diagnosis present

## 2021-08-05 DIAGNOSIS — K2081 Other esophagitis with bleeding: Secondary | ICD-10-CM | POA: Diagnosis not present

## 2021-08-05 DIAGNOSIS — Z794 Long term (current) use of insulin: Secondary | ICD-10-CM

## 2021-08-05 DIAGNOSIS — I739 Peripheral vascular disease, unspecified: Secondary | ICD-10-CM | POA: Diagnosis present

## 2021-08-05 DIAGNOSIS — Z888 Allergy status to other drugs, medicaments and biological substances status: Secondary | ICD-10-CM

## 2021-08-05 DIAGNOSIS — D509 Iron deficiency anemia, unspecified: Secondary | ICD-10-CM | POA: Diagnosis not present

## 2021-08-05 DIAGNOSIS — B1920 Unspecified viral hepatitis C without hepatic coma: Secondary | ICD-10-CM | POA: Diagnosis present

## 2021-08-05 DIAGNOSIS — D500 Iron deficiency anemia secondary to blood loss (chronic): Secondary | ICD-10-CM | POA: Diagnosis not present

## 2021-08-05 LAB — COMPREHENSIVE METABOLIC PANEL
ALT: 26 U/L (ref 0–44)
AST: 69 U/L — ABNORMAL HIGH (ref 15–41)
Albumin: 2.5 g/dL — ABNORMAL LOW (ref 3.5–5.0)
Alkaline Phosphatase: 55 U/L (ref 38–126)
Anion gap: 12 (ref 5–15)
BUN: 47 mg/dL — ABNORMAL HIGH (ref 8–23)
CO2: 26 mmol/L (ref 22–32)
Calcium: 8.6 mg/dL — ABNORMAL LOW (ref 8.9–10.3)
Chloride: 98 mmol/L (ref 98–111)
Creatinine, Ser: 8.58 mg/dL — ABNORMAL HIGH (ref 0.61–1.24)
GFR, Estimated: 6 mL/min — ABNORMAL LOW (ref >60)
Glucose, Bld: 105 mg/dL — ABNORMAL HIGH (ref 70–99)
Potassium: 4.2 mmol/L (ref 3.5–5.1)
Sodium: 136 mmol/L (ref 135–145)
Total Bilirubin: 0.7 mg/dL (ref 0.3–1.2)
Total Protein: 6.9 g/dL (ref 6.5–8.1)

## 2021-08-05 LAB — CBC WITH DIFFERENTIAL/PLATELET
Abs Immature Granulocytes: 0.08 10*3/uL — ABNORMAL HIGH (ref 0.00–0.07)
Basophils Absolute: 0 10*3/uL (ref 0.0–0.1)
Basophils Relative: 0 %
Eosinophils Absolute: 0.1 10*3/uL (ref 0.0–0.5)
Eosinophils Relative: 3 %
HCT: 14.8 % — ABNORMAL LOW (ref 39.0–52.0)
Hemoglobin: 4.6 g/dL — CL (ref 13.0–17.0)
Immature Granulocytes: 2 %
Lymphocytes Relative: 31 %
Lymphs Abs: 1.4 10*3/uL (ref 0.7–4.0)
MCH: 37.1 pg — ABNORMAL HIGH (ref 26.0–34.0)
MCHC: 31.1 g/dL (ref 30.0–36.0)
MCV: 119.4 fL — ABNORMAL HIGH (ref 80.0–100.0)
Monocytes Absolute: 0.5 10*3/uL (ref 0.1–1.0)
Monocytes Relative: 12 %
Neutro Abs: 2.5 10*3/uL (ref 1.7–7.7)
Neutrophils Relative %: 52 %
Platelets: 133 10*3/uL — ABNORMAL LOW (ref 150–400)
RBC: 1.24 MIL/uL — ABNORMAL LOW (ref 4.22–5.81)
RDW: 20.3 % — ABNORMAL HIGH (ref 11.5–15.5)
WBC: 4.7 10*3/uL (ref 4.0–10.5)
nRBC: 0.9 % — ABNORMAL HIGH (ref 0.0–0.2)

## 2021-08-05 LAB — POC OCCULT BLOOD, ED: Fecal Occult Bld: POSITIVE — AB

## 2021-08-05 LAB — I-STAT CHEM 8, ED
BUN: 50 mg/dL — ABNORMAL HIGH (ref 8–23)
Calcium, Ion: 1.12 mmol/L — ABNORMAL LOW (ref 1.15–1.40)
Chloride: 97 mmol/L — ABNORMAL LOW (ref 98–111)
Creatinine, Ser: 9.8 mg/dL — ABNORMAL HIGH (ref 0.61–1.24)
Glucose, Bld: 102 mg/dL — ABNORMAL HIGH (ref 70–99)
HCT: 16 % — ABNORMAL LOW (ref 39.0–52.0)
Hemoglobin: 5.4 g/dL — CL (ref 13.0–17.0)
Potassium: 4.3 mmol/L (ref 3.5–5.1)
Sodium: 137 mmol/L (ref 135–145)
TCO2: 29 mmol/L (ref 22–32)

## 2021-08-05 LAB — HEMOGLOBIN AND HEMATOCRIT, BLOOD
HCT: 17.3 % — ABNORMAL LOW (ref 39.0–52.0)
Hemoglobin: 5.3 g/dL — CL (ref 13.0–17.0)

## 2021-08-05 LAB — PREPARE RBC (CROSSMATCH)

## 2021-08-05 MED ORDER — CHLORHEXIDINE GLUCONATE CLOTH 2 % EX PADS: 6.0000 | MEDICATED_PAD | Freq: Every day | CUTANEOUS | Status: DC

## 2021-08-05 MED ORDER — FERROUS SULFATE 325 (65 FE) MG PO TABS
325.0000 mg | ORAL_TABLET | Freq: Every day | ORAL | Status: DC
  Administered 2021-08-05 – 2021-08-10 (×5): 325 mg via ORAL
  Filled 2021-08-05 (×5): qty 1

## 2021-08-05 MED ORDER — PANTOPRAZOLE 80MG IVPB - SIMPLE MED
80.0000 mg | Freq: Once | INTRAVENOUS | Status: AC
  Administered 2021-08-05: 80 mg via INTRAVENOUS
  Filled 2021-08-05: qty 80

## 2021-08-05 MED ORDER — ATORVASTATIN CALCIUM 40 MG PO TABS
40.0000 mg | ORAL_TABLET | Freq: Every day | ORAL | Status: DC
  Administered 2021-08-05 – 2021-08-10 (×5): 40 mg via ORAL
  Filled 2021-08-05 (×5): qty 1

## 2021-08-05 MED ORDER — ONDANSETRON HCL 4 MG/2ML IJ SOLN: 4.0000 mg | Freq: Four times a day (QID) | INTRAMUSCULAR | Status: DC | PRN

## 2021-08-05 MED ORDER — GABAPENTIN 100 MG PO CAPS
100.0000 mg | ORAL_CAPSULE | Freq: Three times a day (TID) | ORAL | Status: DC
  Administered 2021-08-05 – 2021-08-10 (×12): 100 mg via ORAL
  Filled 2021-08-05 (×13): qty 1

## 2021-08-05 MED ORDER — IOHEXOL 350 MG/ML SOLN
100.0000 mL | Freq: Once | INTRAVENOUS | Status: AC | PRN
  Administered 2021-08-05: 100 mL via INTRAVENOUS

## 2021-08-05 MED ORDER — HYDROXYZINE HCL 25 MG PO TABS
25.0000 mg | ORAL_TABLET | Freq: Three times a day (TID) | ORAL | Status: DC | PRN
  Administered 2021-08-06: 25 mg via ORAL
  Filled 2021-08-05: qty 1

## 2021-08-05 MED ORDER — HYDRALAZINE HCL 25 MG PO TABS
25.0000 mg | ORAL_TABLET | Freq: Four times a day (QID) | ORAL | Status: DC | PRN
  Administered 2021-08-06: 25 mg via ORAL
  Filled 2021-08-05: qty 1

## 2021-08-05 MED ORDER — SODIUM CHLORIDE 0.9 % IV SOLN
10.0000 mL/h | Freq: Once | INTRAVENOUS | Status: AC
  Administered 2021-08-05: 10 mL/h via INTRAVENOUS

## 2021-08-05 MED ORDER — ONDANSETRON HCL 4 MG PO TABS: 4.0000 mg | ORAL_TABLET | Freq: Four times a day (QID) | ORAL | Status: DC | PRN

## 2021-08-05 MED ORDER — ASPIRIN EC 81 MG PO TBEC
81.0000 mg | DELAYED_RELEASE_TABLET | Freq: Every day | ORAL | Status: DC
  Administered 2021-08-07 – 2021-08-10 (×4): 81 mg via ORAL
  Filled 2021-08-05 (×5): qty 1

## 2021-08-05 MED ORDER — HYDROCODONE-ACETAMINOPHEN 5-325 MG PO TABS
1.0000 | ORAL_TABLET | Freq: Four times a day (QID) | ORAL | Status: DC | PRN
  Administered 2021-08-05 – 2021-08-06 (×3): 1 via ORAL
  Filled 2021-08-05 (×3): qty 1

## 2021-08-05 MED ORDER — NICOTINE 7 MG/24HR TD PT24
7.0000 mg | MEDICATED_PATCH | Freq: Every day | TRANSDERMAL | Status: AC
  Administered 2021-08-05: 7 mg via TRANSDERMAL
  Filled 2021-08-05: qty 1

## 2021-08-05 MED ORDER — CALCIUM ACETATE 667 MG PO TABS: 1.0000 | ORAL_TABLET | Freq: Three times a day (TID) | ORAL | Status: DC

## 2021-08-05 MED ORDER — CALCIUM ACETATE (PHOS BINDER) 667 MG PO CAPS: 1334.0000 mg | ORAL_CAPSULE | Freq: Three times a day (TID) | ORAL | Status: DC

## 2021-08-05 MED ORDER — LIDOCAINE 4 % EX CREA
1.0000 "application " | TOPICAL_CREAM | CUTANEOUS | Status: DC
  Administered 2021-08-07: 1 via TOPICAL
  Filled 2021-08-05: qty 5

## 2021-08-05 MED ORDER — LOPERAMIDE HCL 2 MG PO CAPS: 4.0000 mg | ORAL_CAPSULE | Freq: Three times a day (TID) | ORAL | Status: DC | PRN

## 2021-08-05 MED ORDER — CALCIUM ACETATE (PHOS BINDER) 667 MG PO CAPS
667.0000 mg | ORAL_CAPSULE | Freq: Three times a day (TID) | ORAL | Status: DC
  Administered 2021-08-06 – 2021-08-10 (×9): 667 mg via ORAL
  Filled 2021-08-05 (×9): qty 1

## 2021-08-05 MED ORDER — SACCHAROMYCES BOULARDII 250 MG PO CAPS
250.0000 mg | ORAL_CAPSULE | Freq: Two times a day (BID) | ORAL | Status: DC
  Administered 2021-08-06 – 2021-08-10 (×7): 250 mg via ORAL
  Filled 2021-08-05 (×9): qty 1

## 2021-08-05 MED ORDER — PANTOPRAZOLE INFUSION (NEW) - SIMPLE MED
8.0000 mg/h | INTRAVENOUS | Status: DC
  Administered 2021-08-05 – 2021-08-07 (×5): 8 mg/h via INTRAVENOUS
  Filled 2021-08-05 (×5): qty 80

## 2021-08-05 NOTE — H&P (Signed)
?History and Physical  ? ? ?Daniel [name] TDD:[id number] DOB: 06-28-1960 DOA: 08/05/2021 ? ?PCP: [name], NP (Confirm with patient/family/NH records and if not entered, this has to be entered at [facility] point of entry) ?Patient coming from: Home ? ?I have personally briefly reviewed patient's old medical records in [place] ? ?Chief Complaint: Black tarry stool ? ?HPI: Daniel [name] is a 61 y.o. male with medical history significant of recurrent GI bleed with history of multiple AV malformations, chronic iron deficiency anemia, diverticulosis, ESRD on HD, PAD status post left BKA, HTN, IIDM, came with worsening of black tarry stool and low hemoglobin counts. ? ?Patient was recently hospitalized for GI bleed in March.  EGD showed nonbleeding duodenal ulcer and colonic diverticulosis.  For the last 7 to 10 days, patient started noticed stool color has been getting darker.  No abdominal pain no nauseous vomiting no diarrhea.  He also reported feeling tired easily.  No chest pains.  On Friday, during HD, blood draw was done and this morning result came back Hb= 5.5 and he was contacted to come to ED. ? ?ED Course: No tachycardia no hypotension.  Repeat hemoglobin 4.6. ? ?Review of Systems: As per HPI otherwise 14 point review of systems negative.  ? ? ?Past Medical History:  ?Diagnosis Date  ? Anemia   ? Diabetes mellitus without complication ([place])   ? patient denies  ? Dialysis patient [facility])   ? End stage chronic kidney disease ([place])   ? Hypertension   ? ICH (intracerebral hemorrhage) ([place]) 05/20/2017  ? PAD (peripheral artery disease) ([place])   ? Shoulder pain, left 06/28/2013  ? ? ?Past Surgical History:  ?Procedure Laterality Date  ? A/V FISTULAGRAM N/A 08/15/2020  ? Procedure: A/V FISTULAGRAM - Left Upper;  Surgeon: Cherre Robins, MD;  Location: [place] CV LAB;  Service: Cardiovascular;  Laterality: N/A;  ? ABDOMINAL AORTOGRAM W/LOWER EXTREMITY N/A 04/08/2021  ? Procedure: ABDOMINAL  AORTOGRAM W/LOWER EXTREMITY;  Surgeon: Waynetta Sandy, MD;  Location: [place] CV LAB;  Service: Cardiovascular;  Laterality: N/A;  ? AMPUTATION Left 04/16/2021  ? Procedure: LEFT BELOW KNEE AMPUTATION;  Surgeon: Angelia Mould, MD;  Location: [facility] OR;  Service: Vascular;  Laterality: Left;  ? AMPUTATION Left 06/17/2021  ? Procedure: REVISION AMPUTATION BELOW KNEE;  Surgeon: Broadus John, MD;  Location: [place];  Service: Vascular;  Laterality: Left;  ? APPENDECTOMY    ? APPLICATION OF WOUND VAC Left 06/17/2021  ? Procedure: APPLICATION OF WOUND VAC;  Surgeon: Broadus John, MD;  Location: [place];  Service: Vascular;  Laterality: Left;  ? AV FISTULA PLACEMENT Left 08/03/2019  ? Procedure: LEFT ARM ARTERIOVENOUS (AV) CEPHALIC  FISTULA CREATION;  Surgeon: Waynetta Sandy, MD;  Location: [place];  Service: Vascular;  Laterality: Left;  ? BIOPSY  06/30/2019  ? Procedure: BIOPSY;  Surgeon: Ronald Lobo, MD;  Location: [place];  Service: Endoscopy;;  ? BIOPSY  08/02/2019  ? Procedure: BIOPSY;  Surgeon: Yetta Flock, MD;  Location: [facility] ENDOSCOPY;  Service: Gastroenterology;;  ? BIOPSY  02/08/2021  ? Procedure: BIOPSY;  Surgeon: Sharyn Creamer, MD;  Location: [facility] ENDOSCOPY;  Service: Gastroenterology;;  ? BIOPSY  02/24/2021  ? Procedure: BIOPSY;  Surgeon: [name], MD;  Location: [facility] ENDOSCOPY;  Service: Gastroenterology;;  ? BIOPSY  03/26/2021  ? Procedure: BIOPSY;  Surgeon: Lavena Bullion, DO;  Location: [place];  Service: Gastroenterology;;  ? COLONOSCOPY  01/23/2012  ?  Procedure: COLONOSCOPY;  Surgeon: Danie Binder, MD;  [facility];  Service: Endoscopy;  Laterality: N/A;  11:10 AM  ? COLONOSCOPY WITH PROPOFOL N/A 06/30/2019  ? Procedure: COLONOSCOPY WITH PROPOFOL;  Surgeon: Ronald Lobo, MD;  Location: [place];  Service: Endoscopy;  Laterality: N/A;  ? ENTEROSCOPY N/A 08/02/2019  ? Procedure: ENTEROSCOPY;  Surgeon: Yetta Flock, MD;   Location: [facility] ENDOSCOPY;  Service: Gastroenterology;  Laterality: N/A;  ? ENTEROSCOPY N/A 02/24/2021  ? Procedure: ENTEROSCOPY;  Surgeon: [name], MD;  Location: [place];  Service: Gastroenterology;  Laterality: N/A;  ? ENTEROSCOPY N/A 03/26/2021  ? Procedure: ENTEROSCOPY;  Surgeon: Lavena Bullion, DO;  Location: [place];  Service: Gastroenterology;  Laterality: N/A;  ? ENTEROSCOPY N/A 07/05/2021  ? Procedure: ENTEROSCOPY;  Surgeon: Carol Ada, MD;  Location: [place];  Service: Gastroenterology;  Laterality: N/A;  ? ESOPHAGOGASTRODUODENOSCOPY N/A 08/10/2020  ? Procedure: ESOPHAGOGASTRODUODENOSCOPY (EGD);  Surgeon: Jerene Bears, MD;  Location: [facility] ENDOSCOPY;  Service: Gastroenterology;  Laterality: N/A;  ? ESOPHAGOGASTRODUODENOSCOPY (EGD) WITH PROPOFOL N/A 06/30/2019  ? Procedure: ESOPHAGOGASTRODUODENOSCOPY (EGD) WITH PROPOFOL;  Surgeon: Ronald Lobo, MD;  Location: [place];  Service: Endoscopy;  Laterality: N/A;  ? ESOPHAGOGASTRODUODENOSCOPY (EGD) WITH PROPOFOL N/A 01/12/2021  ? Procedure: ESOPHAGOGASTRODUODENOSCOPY (EGD) WITH PROPOFOL;  Surgeon: Sharyn Creamer, MD;  Location: [place];  Service: Gastroenterology;  Laterality: N/A;  ? ESOPHAGOGASTRODUODENOSCOPY (EGD) WITH PROPOFOL N/A 02/08/2021  ? Procedure: ESOPHAGOGASTRODUODENOSCOPY (EGD) WITH PROPOFOL;  Surgeon: Sharyn Creamer, MD;  Location: [place];  Service: Gastroenterology;  Laterality: N/A;  ? FISTULA SUPERFICIALIZATION Left 10/17/2019  ? Procedure: LEFT UPPER EXTREMITY FISTULA REVISION, SIDE BRANCH LIGATION,  AND SUPERFICIALIZATION;  Surgeon: Marty Heck, MD;  Location: [place];  Service: Vascular;  Laterality: Left;  ? FISTULA SUPERFICIALIZATION Left 04/19/2021  ? Procedure: PLICATION OF ANEURYSM LEFT ARTERIOVENOUS FISTULA;  Surgeon: Angelia Mould, MD;  Location: [place];  Service: Vascular;  Laterality: Left;  ? FLEXIBLE SIGMOIDOSCOPY N/A 07/05/2021  ? Procedure: FLEXIBLE SIGMOIDOSCOPY;  Surgeon:  Carol Ada, MD;  Location: [place];  Service: Gastroenterology;  Laterality: N/A;  ? GIVENS CAPSULE STUDY N/A 06/30/2019  ? Procedure: GIVENS CAPSULE STUDY;  Surgeon: Ronald Lobo, MD;  Location: [facility] ENDOSCOPY;  Service: Endoscopy;  Laterality: N/A;  ? HEMOSTASIS CLIP PLACEMENT  08/10/2020  ? Procedure: HEMOSTASIS CLIP PLACEMENT;  Surgeon: Jerene Bears, MD;  Location: [facility] ENDOSCOPY;  Service: Gastroenterology;;  ? HEMOSTASIS CLIP PLACEMENT  01/12/2021  ? Procedure: HEMOSTASIS CLIP PLACEMENT;  Surgeon: Sharyn Creamer, MD;  Location: [place];  Service: Gastroenterology;;  ? HEMOSTASIS CONTROL  08/02/2019  ? Procedure: HEMOSTASIS CONTROL;  Surgeon: Yetta Flock, MD;  Location: [place];  Service: Gastroenterology;;  ? HOT HEMOSTASIS  02/24/2021  ? Procedure: HOT HEMOSTASIS (ARGON PLASMA COAGULATION/BICAP);  Surgeon: [name], MD;  Location: [facility] ENDOSCOPY;  Service: Gastroenterology;;  ? HOT HEMOSTASIS N/A 03/26/2021  ? Procedure: HOT HEMOSTASIS (ARGON PLASMA COAGULATION/BICAP);  Surgeon: Lavena Bullion, DO;  Location: [facility] ENDOSCOPY;  Service: Gastroenterology;  Laterality: N/A;  ? HOT HEMOSTASIS N/A 07/05/2021  ? Procedure: HOT HEMOSTASIS (ARGON PLASMA COAGULATION/BICAP);  Surgeon: Carol Ada, MD;  Location: [place];  Service: Gastroenterology;  Laterality: N/A;  ? INCISION AND DRAINAGE ABSCESS N/A 06/29/2016  ? Procedure: INCISION AND DRAINAGE ABDOMINAL WALL ABSCESS;  Surgeon: Alphonsa Overall, MD;  [facility];  Service: General;  Laterality: N/A;  ? INSERTION OF DIALYSIS CATHETER Right 08/03/2019  ? Procedure: INSERTION OF DIALYSIS CATHETER;  Surgeon: Waynetta Sandy, MD;  Location: [place];  Service: Vascular;  Laterality: Right;  ? INSERTION OF DIALYSIS CATHETER Right 10/22/2019  ? Procedure: INSERTION OF 23CM TUNNELED DIALYSIS CATHETER RIGHT INTERNAL JUGULAR;  Surgeon: Angelia Mould, MD;  Location: [facility] OR;  Service: Vascular;  Laterality: Right;  ? INSERTION OF  DIALYSIS CATHETER Right 08/12/2020  ? Procedure: INSERTION OF Right internal Jugular TUNNELED  DIALYSIS CATHETER.;  Surgeon: Waynetta Sandy, MD;  Location: [place];  Service: Vascular;  Laterality: R

## 2021-08-05 NOTE — Consult Note (Addendum)
? ?Referring Provider: Dr. Shirlyn Goltz.  ?Primary Care Physician:  [name], NP ?Primary Gastroenterologist:  Althia Forts.  ? ?Reason for Consultation: Anemia, GI bleed  ? ? ?HPI: Daniel [name] is a 61 y.o. male with a past medical history of hypertension, peripheral arterial disease s/p right SFA stenting by Dr. Standley Dakins 06/2021 on Plavix and ASA 58m QD, intracerebral hemorrhage, end-stage renal disease on dialysis, chronic anemia, GI bleed secondary to bleeding duodenal ulcer and small bowel AVMs. S/P left BKA. ? ?[facility] contacted the nursing staff at the SNF reporting a hemoglobin level of 5.7.  Labs in the ED showed a hemoglobin level of 4.6.  Hematocrit 14.8.  MCV 119.4.  Platelet 133.  Creatinine 8.58.  BUN 47 (baseline BUN 18).  AST 69.  ALT 26.  Total bili 0.7.  Alk phos 55.  Rectal exam by the ED physician showed dark stool. FOBT positive. Two units of PRBCs were ordered.  He was placed on Protonix infusion.  A GI consult was requested for further evaluation regarding anemia with GI bleeding.  ? ?He has a history of GI bleeding as a secondary to small bowel AVMs.  He underwent a small bowel enteroscopy by Dr. CBryan Lemmaduring a hospital admission 03/26/2021 which identified 2 nonbleeding angiectasia's in the duodenum treated with APC and none ulcerative gastritis.  He was most recently admitted to the hospital 07/03/2021 - 07/10/2021 secondary to bright red hematochezia following a dialysis session on 3/8.  Hemoglobin level was 8.0.  FOBT positive.  He underwent a small bowel enteroscopy by Dr. HBenson Norway3/01/2022 which showed friable gastric mucosa, a few recently bleeding angiectasia is noted in the duodenum treated with a monopolar probe and a single nonbleeding AVM lesion in the jejunum treated with a monopolar probe.  He underwent a flexible sigmoidoscopy which showed red blood in the rectum.  His hemoglobin stabilized  without evidence of further active GI bleeding and he was discharged home on aspirin 81 mg daily indefinitely and Plavix 75 mg x 1 month.  He is prescribed ferrous sulfate 325 mg daily, he is unsure if he is receiving it at the SNF. ? ?He stated he was not sure exactly why he was sent to the emergency room.  He denies having any nausea or vomiting.  No upper or lower abdominal pain.  He was previously passing a fairly normal darker brown stool most days, however, he started passing a few black solid stools for the past 3 to 4 days.  No bright red rectal bleeding.  He takes aspirin and Plavix as administered by the nursing staff at the SNF.  His last dose of Plavix was likely on 08/04/2021.  He complains of having left stump pain which has persisted since his left BKA surgery.  No chest pain or shortness of breath.  No other complaints at this time.  No family at the bedside. ? ?PAST GI PROCEDURES: ? ?Small bowel enteroscopy by Dr. HBenson Norway3/01/2022: ?- Normal esophagus. ?- Friable gastric mucosa. ?- A few recently bleeding angioectasias in the duodenum. Treated with a monopolar probe. ?- A single non-bleeding angiodysplastic lesion in the jejunum. Treated with a monopolar ?probe. ?- No specimens collected. ? ?Flexible sigmoidoscopy 07/05/2021: ?- Blood in the rectum, in the recto-sigmoid colon and in the sigmoid colon. ?- No specimens collected. ?-Abdominal/pelvic CT angiogram was recommended if he developed a rapid lower GI bleeding. ? ?Small bowel enteroscopy by  Dr. Bryan Lemma 03/26/2021: ?-Normal esophagus.  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 ?-Mild, nonulcerative gastritis.  Biopsied. ?-Mild inflammation with nodularity in the duodenal bulb.  This overall appeared improved from images on the prior studies. ?-2 nonbleeding angiectasia's in the duodenum.  Treated with argon plasma coagulation. ? ?EGD 08/10/2020: ?- Normal esophagus. ?- Gastritis. ?- Bleeding duodenal ?- Bleeding duodenal ulcer  with a visible vessel. Clips were placed x 3 with apparent ?hemostasis. ?- Duodenitis with nodularity and polypoid mucosa. ?- No specimens collected. ? ?Small bowel enteroscopy 08/02/2019: ?- Esophagogastric landmarks identified. ?- Normal esophagus. ?- Normal stomach. ?- Prominent ampulla without overt adenomatous changes. Biopsied. ?- A few benign appearing duodenal polyps. Biopsied, one suspected AVM treated as above. ?- A single non-bleeding angiodysplastic lesion in the duodenum. Treated with argon plasma ?coagulation (APC). ?- A single non-bleeding angiodysplastic lesion in the proximal jejunum. Treated with argon ?plasma coagulation ? ?EGD 06/30/2019: ?- No source of iron deficiency anemia or heme positive stoIonlp eantideonstcopically evident. ?- Normal larynx. ?- Normal esophagus. ?- Normal stomach. ?- Duodenal polyp vs. prominent papilla, benign-appearing. Biopsied. ? ?Colonoscopy 06/30/2019: ?- No active bleeding or source of heme positive stool/anemia seen. ?- Diverticulosis in the sigmoid colon. ?- Probable old blood in the terminal ileum, raising the question of a small bowel source ?(given negative egd). ?- The distal rectum and anal verge are normal on retroflexion view. ?- No specimens collected. ? ?Past Medical History:  ?Diagnosis Date  ? Anemia   ? Diabetes mellitus without complication ([place])   ? patient denies  ? Dialysis patient [facility])   ? End stage chronic kidney disease ([place])   ? Hypertension   ? ICH (intracerebral hemorrhage) ([place]) 05/20/2017  ? PAD (peripheral artery disease) ([place])   ? Shoulder pain, left 06/28/2013  ? ? ?Past Surgical History:  ?Procedure Laterality Date  ? A/V FISTULAGRAM N/A 08/15/2020  ? Procedure: A/V FISTULAGRAM - Left Upper;  Surgeon: Cherre Robins, MD;  Location: [place] [facility];  Service: Cardiovascular;  Laterality: N/A;  ? ABDOMINAL AORTOGRAM W/LOWER EXTREMITY N/A 04/08/2021  ? Procedure: ABDOMINAL AORTOGRAM W/LOWER EXTREMITY;  Surgeon: Waynetta Sandy,  MD;  Location: [place] [facility];  Service: Cardiovascular;  Laterality: N/A;  ? AMPUTATION Left 04/16/2021  ? Procedure: LEFT BELOW KNEE AMPUTATION;  Surgeon: Angelia Mould, MD;  Location: [facility] OR;  Service: Vascular;  Laterality: Left;  ? AMPUTATION Left 06/17/2021  ? Procedure: REVISION AMPUTATION BELOW KNEE;  Surgeon: Broadus John, MD;  Location: [place];  Service: Vascular;  Laterality: Left;  ? APPENDECTOMY    ? APPLICATION OF WOUND VAC Left 06/17/2021  ? Procedure: APPLICATION OF WOUND VAC;  Surgeon: Broadus John, MD;  Location: [place];  Service: Vascular;  Laterality: Left;  ? AV FISTULA PLACEMENT Left 08/03/2019  ? Procedure: LEFT ARM ARTERIOVENOUS (AV) CEPHALIC  FISTULA CREATION;  Surgeon: Waynetta Sandy, MD;  Location: [place];  Service: Vascular;  Laterality: Left;  ? BIOPSY  06/30/2019  ? Procedure: BIOPSY;  Surgeon: Ronald Lobo, MD;  Location: [place];  Service: Endoscopy;;  ? BIOPSY  08/02/2019  ? Procedure: BIOPSY;  Surgeon: Yetta Flock, MD;  Location: [facility] ENDOSCOPY;  Service: Gastroenterology;;  ? BIOPSY  02/08/2021  ? Procedure: BIOPSY;  Surgeon: Sharyn Creamer, MD;  Location: [facility]COPY;  Service: Gastroenterology;;  ? BIOPSY  02/24/2021  ? Procedure: BIOPSY;  Surgeon: [name], MD;  Location: [place];  Service: Gastroenterology;;  ? BIOPSY  03/26/2021  ? Procedure: BIOPSY;  Surgeon: Lavena Bullion, DO;  Location: [place];  Service: Gastroenterology;;  ? COLONOSCOPY  01/23/2012  ? Procedure: COLONOSCOPY;  Surgeon: Danie Binder, MD;  [facility];  Service: Endoscopy;  Laterality: N/A;  11:10 AM  ? COLONOSCOPY WITH PROPOFOL N/A 06/30/2019  ? Procedure: COLONOSCOPY WITH PROPOFOL;  Surgeon: Ronald Lobo, MD;  Location: [place];  Service: Endoscopy;  Laterality: N/A;  ? ENTEROSCOPY N/A 08/02/2019  ? Procedure: ENTEROSCOPY;  Surgeon: Yetta Flock, MD;  Location: [facility] ENDOSCOPY;  Service: Gastroenterology;  Laterality: N/A;   ? ENTEROSCOPY N/A 02/24/2021  ? Procedure: ENTEROSCOPY;  Surgeon: [name], MD;  Location: [place];  Service: Gastroenterology;  Laterality: N/A;  ? ENTEROSCOPY N/A 03/26/2021  ? Procedur

## 2021-08-05 NOTE — ED Triage Notes (Signed)
Patient presents to ed via [facility] per [facility] patients Hgb was 5.7 unsure when this was drawn. Patient states he was due for dialysis today. Denies any complaints.  ?

## 2021-08-05 NOTE — ED Provider Notes (Signed)
?[place] ?Provider Note ? ? ?CSN: [id number] ?Arrival date & time: 08/05/21  1020 ? ?  ? ?History ? ?Chief Complaint  ?Patient presents with  ? Low Hgb  ? ? ?Daniel [name] is a 61 y.o. male history of ESRD on HD (last HD was 2 days ago), AVM of GI tract, here presenting with anemia.  Patient was at dialysis 2 days ago and had blood work drawn.  He states that he was called this morning because his hemoglobin was low.  We called Orlene Erm from nursing home and his hemoglobin was 5.7 apparently.  He states that he is chronically weak but denies worsening weakness.  He has chronic black stools.  Patient had endoscopy recently that showed nonbleeding AVMs.  Patient is not currently on blood thinners ? ?The history is provided by the patient.  ? ?  ? ?Home Medications ?Prior to Admission medications   ?Medication Sig Start Date End Date Taking? Authorizing Provider  ?amLODipine (NORVASC) 10 MG tablet Take 1 tablet (10 mg total) by mouth at bedtime. 03/23/21   [name], NP  ?aspirin EC 81 MG EC tablet Take 1 tablet (81 mg total) by mouth daily. Swallow whole. 07/10/21   Nolberto Hanlon, MD  ?atorvastatin (LIPITOR) 40 MG tablet Take 1 tablet (40 mg total) by mouth daily. 07/10/21   Nolberto Hanlon, MD  ?blood glucose meter kit and supplies KIT Dispense based on patient and insurance preference. Use up to four times daily as directed. (FOR ICD-9 250.00, 250.01). 07/01/16   Nita Sells, MD  ?calcium acetate (PHOSLO) 667 MG capsule Take 1,334 mg by mouth 3 (three) times daily with meals. 03/14/21   [provider]  ?cloNIDine (CATAPRES) 0.1 MG tablet Take 0.1-0.2 mg by mouth See admin instructions. Take two tablet (0.2 mg) twice daily at 9am and 6pm on Sunday, Tuesday, Thursday, Saturday (non-dialysis days), take one tablet (0.1 mg) at bedtime - 9pm on Monday, Wednesday, Friday (dialysis days) 03/13/21   [provider]  ?clopidogrel (PLAVIX) 75 MG tablet  Take 1 tablet (75 mg total) by mouth daily. 07/10/21   Nolberto Hanlon, MD  ?ferrous sulfate 325 (65 FE) MG tablet Take 325 mg by mouth every morning.    [provider]  ?gabapentin (NEURONTIN) 100 MG capsule Take 1 capsule (100 mg total) by mouth 3 (three) times daily. 04/09/21   Waynetta Sandy, MD  ?hydrOXYzine (ATARAX) 25 MG tablet TAKE 1 TABLET BY MOUTH EVERY 8 HOURS AS NEEDED FOR ANXIETY OR  ITCHING ?Strength: 25 mg ?Patient taking differently: Take 25 mg by mouth every 8 (eight) hours as needed for itching. 04/23/21   Bonnielee Haff, MD  ?Lidocaine 5 % CREA Apply 1 application. topically every Monday, Wednesday, and Friday. Dialysis days - for itching    [provider]  ?loperamide (IMODIUM) 2 MG capsule Take 2 capsules (4 mg total) by mouth 3 (three) times daily as needed for diarrhea or loose stools. ?Patient taking differently: Take 4 mg by mouth every 8 (eight) hours as needed for diarrhea or loose stools. 04/23/21   Bonnielee Haff, MD  ?Multiple Vitamin (MULTIVITAMIN WITH MINERALS) TABS tablet Take 1 tablet by mouth every morning.    [provider]  ?Nutritional Supplements (,FEEDING SUPPLEMENT, PROSOURCE PLUS) liquid Take 30 mLs by mouth 3 (three) times daily.    [provider]  ?pantoprazole (PROTONIX) 40 MG tablet Take 1 tablet (40 mg total) by mouth 2 (two) times daily. 02/28/21  Charlynne Cousins, MD  ?saccharomyces boulardii (FLORASTOR) 250 MG capsule Take 1 capsule (250 mg total) by mouth 2 (two) times daily. 04/23/21   Bonnielee Haff, MD  ?silver sulfADIAZINE (SILVADENE) 1 % cream Apply 1 application. topically every evening. To lower back    [provider]  ?colchicine 0.6 MG tablet Take 0.5 tablets (0.3 mg total) by mouth 2 (two) times daily. 07/24/20 07/29/20  Noemi Chapel, MD  ?furosemide (LASIX) 40 MG tablet Take 1 tablet (40 mg total) by mouth daily. 07/25/19 02/09/20  Ladona Horns, MD  ?   ? ?Allergies    ?Seroquel [quetiapine] and  Dilaudid [hydromorphone hcl]   ? ?Review of Systems   ?Review of Systems  ?Gastrointestinal:  Positive for blood in stool.  ?All other systems reviewed and are negative. ? ?Physical Exam ?Updated Vital Signs ?BP 118/70 (BP Location: Right Arm)   Pulse 87   Temp 98 ?F (36.7 ?C) (Oral)   Resp 18   Ht 5' 11"  (1.803 m)   Wt 113.4 kg   SpO2 100%   BMI 34.87 kg/m?  ?Physical Exam ?Vitals and nursing note reviewed.  ?Constitutional:   ?   Comments: Chronically ill, pale  ?HENT:  ?   Head: Normocephalic.  ?   Nose: Nose normal.  ?   Mouth/Throat:  ?   Mouth: Mucous membranes are moist.  ?Eyes:  ?   Comments: Conjunctiva is pale  ?Cardiovascular:  ?   Rate and Rhythm: Normal rate.  ?   Pulses: Normal pulses.  ?   Heart sounds: Normal heart sounds.  ?Pulmonary:  ?   Effort: Pulmonary effort is normal.  ?   Breath sounds: Normal breath sounds.  ?Abdominal:  ?   General: Abdomen is flat.  ?   Palpations: Abdomen is soft.  ?Genitourinary: ?   Comments: Rectal- dark stools  ?Musculoskeletal:     ?   General: Normal range of motion.  ?   Cervical back: Normal range of motion and neck supple.  ?   Comments: L AKA   ?Skin: ?   General: Skin is warm.  ?   Capillary Refill: Capillary refill takes less than 2 seconds.  ?Neurological:  ?   General: No focal deficit present.  ?Psychiatric:     ?   Mood and Affect: Mood normal.  ? ? ?ED Results / Procedures / Treatments   ?Labs ?(all labs ordered are listed, but only abnormal results are displayed) ?Labs Reviewed  ?POC OCCULT BLOOD, ED - Abnormal; Notable for the following components:  ?    Result Value  ? Fecal Occult Bld POSITIVE (*)   ? All other components within normal limits  ?CBC WITH DIFFERENTIAL/PLATELET  ?COMPREHENSIVE METABOLIC PANEL  ?I-STAT CHEM 8, ED  ?TYPE AND SCREEN  ? ? ?EKG ?None ? ?Radiology ?No results found. ? ?Procedures ?Procedures  ? ? ?CRITICAL CARE ?Performed by: Wandra Arthurs ? ? ?Total critical care time: 30 minutes ? ?Critical care time was exclusive of  separately billable procedures and treating other patients. ? ?Critical care was necessary to treat or prevent imminent or life-threatening deterioration. ? ?Critical care was time spent personally by me on the following activities: development of treatment plan with patient and/or surrogate as well as nursing, discussions with consultants, evaluation of patient's response to treatment, examination of patient, obtaining history from patient or surrogate, ordering and performing treatments and interventions, ordering and review of laboratory studies, ordering and review of radiographic studies, pulse oximetry  and re-evaluation of patient's condition. ? ? ?Medications Ordered in ED ?Medications  ?pantoprazole (PROTONIX) 80 mg /NS 100 mL IVPB (has no administration in time range)  ?pantoprozole (PROTONIX) 80 mg /NS 100 mL infusion (has no administration in time range)  ? ? ?ED Course/ Medical Decision Making/ A&P ?  ?                        ?Medical Decision Making ?FAITH BRANAN is a 61 y.o. male here presenting with anemia.  Patient has a history of anemia from slow GI bleed from AVMs.  Patient is also a dialysis patient and last dialysis was 2 days ago.  Patient had hemoglobin 5.7 per her report.  We will repeat hemoglobin and we will get guaiac and patient likely will need transfusion. ? ?12:36 PM ?Patient's hemoglobin is 5.4.  Potassium is normal.  I ordered 2 units PRBC.  Also ordered Protonix IV bolus and drip.  I notified nephrology, Dr. Hollie Salk, to dialyze patient.  Also attempted to consulted GI ([facility] on-call) but was unable to reach anybody despite multiple attempts.  At this point, hospitalist to admit for GI bleed.  Transfusion ordered ? ? ?Problems Addressed: ?ESRD (end stage renal disease) on dialysis [facility]): acute illness or injury ?Gastrointestinal hemorrhage, unspecified gastrointestinal hemorrhage type: acute illness or injury ?Melena: acute illness or injury ? ?Amount and/or Complexity of Data  Reviewed ?Labs: ordered. Decision-making details documented in ED Course. ? ?Risk ?Prescription drug management. ?Decision regarding hospitalization. ? ?Final Clinical Impression(s) / ED Diagnoses ?Final dia

## 2021-08-05 NOTE — Progress Notes (Signed)
Called about patient in ED with GI bleed.  Question regarding anticoagulation.  Underwent SFA stenting with Dr. Stanford Breed last month.  Hemoglobin 4.6.  Discussed with hospitalist okay to stop Plavix now that he is 1 month post procedure.  Would continue 81 mg aspirin for maintaining stent patency if possible.  Please call vascular with further questions or concerns. ? ?Marty Heck, MD ?[facility] ?Office: [phone] ? ? ?Marty Heck ? ? ?

## 2021-08-05 NOTE — ED Triage Notes (Signed)
Pt BIB [facility] from [facility] d/t his labs showing his Hgb was low. A/Ox4, w/c bound. ?

## 2021-08-05 NOTE — ED Notes (Signed)
Report called to 5N Charge RN in regards to changing purple man to green. Charge RN to see if accepting RN has read report and if so states she will change purple man to green.  ?

## 2021-08-05 NOTE — Consult Note (Signed)
[facility] ?Renal Consultation Note  ?  ?Indication for Consultation:  Management of ESRD/hemodialysis, anemia, hypertension/volume, and secondary hyperparathyroidism. ? ?HPI:  ?Daniel [name] is a 61 y.o. male. PMH includes ESRD on dialysis, HTN, ICH, PAD and recurrent GI bleeds. Pt was sent to the ED today due to an outpatient dialysis lab reporting Hgb 5.7 on 07/31/21. Patient has had recurrent admissions for symptomatic anemia and GI bleeds in the past. He had been noticing darker stool lately. In the ED, Hgb was 4.6 then 5.4 on repeat. K+ 4.3, Cr 9.8, Ca 8.6, BUN 50. VSS. Nephrology was asked to see for management of ESRD during his admission. On exam, patient reports his leg has been swollen for awhile but he has no shortness of breath or orthopnea. Denies chest pain, palpitations, dizziness, abdominal pain, nausea and vomiting. He is getting ready to go to CT scan.  ? ?Outpatient dialysis orders: ?[facility] MWF 4 hours BFR 450 DFR 500 EDW 117kg 2K 2Ca no heparin  ?Last HD was 4/7 for 3 hr 10 min, left 0.9kg above his EDW ?Mircera '225mg'$  given on 07/31/21 ? ?Past Medical History:  ?Diagnosis Date  ? Anemia   ? Diabetes mellitus without complication ([place])   ? patient denies  ? Dialysis patient [facility])   ? End stage chronic kidney disease ([place])   ? Hypertension   ? ICH (intracerebral hemorrhage) ([place]) 05/20/2017  ? PAD (peripheral artery disease) ([place])   ? Shoulder pain, left 06/28/2013  ? ?Past Surgical History:  ?Procedure Laterality Date  ? A/V FISTULAGRAM N/A 08/15/2020  ? Procedure: A/V FISTULAGRAM - Left Upper;  Surgeon: Cherre Robins, MD;  Location: [place] [facility];  Service: Cardiovascular;  Laterality: N/A;  ? ABDOMINAL AORTOGRAM W/LOWER EXTREMITY N/A 04/08/2021  ? Procedure: ABDOMINAL AORTOGRAM W/LOWER EXTREMITY;  Surgeon: Waynetta Sandy, MD;  Location: [place] [facility];  Service: Cardiovascular;  Laterality: N/A;  ? AMPUTATION Left 04/16/2021  ? Procedure: LEFT BELOW  KNEE AMPUTATION;  Surgeon: Angelia Mould, MD;  Location: [facility] OR;  Service: Vascular;  Laterality: Left;  ? AMPUTATION Left 06/17/2021  ? Procedure: REVISION AMPUTATION BELOW KNEE;  Surgeon: Broadus John, MD;  Location: [place];  Service: Vascular;  Laterality: Left;  ? APPENDECTOMY    ? APPLICATION OF WOUND VAC Left 06/17/2021  ? Procedure: APPLICATION OF WOUND VAC;  Surgeon: Broadus John, MD;  Location: [place];  Service: Vascular;  Laterality: Left;  ? AV FISTULA PLACEMENT Left 08/03/2019  ? Procedure: LEFT ARM ARTERIOVENOUS (AV) CEPHALIC  FISTULA CREATION;  Surgeon: Waynetta Sandy, MD;  Location: [place];  Service: Vascular;  Laterality: Left;  ? BIOPSY  06/30/2019  ? Procedure: BIOPSY;  Surgeon: Ronald Lobo, MD;  Location: [place];  Service: Endoscopy;;  ? BIOPSY  08/02/2019  ? Procedure: BIOPSY;  Surgeon: Yetta Flock, MD;  Location: [facility] ENDOSCOPY;  Service: Gastroenterology;;  ? BIOPSY  02/08/2021  ? Procedure: BIOPSY;  Surgeon: Sharyn Creamer, MD;  Location: [facility] ENDOSCOPY;  Service: Gastroenterology;;  ? BIOPSY  02/24/2021  ? Procedure: BIOPSY;  Surgeon: [name], MD;  Location: [facility] ENDOSCOPY;  Service: Gastroenterology;;  ? BIOPSY  03/26/2021  ? Procedure: BIOPSY;  Surgeon: Lavena Bullion, DO;  Location: [place];  Service: Gastroenterology;;  ? COLONOSCOPY  01/23/2012  ? Procedure: COLONOSCOPY;  Surgeon: Danie Binder, MD;  [facility];  Service: Endoscopy;  Laterality: N/A;  11:10 AM  ? COLONOSCOPY WITH PROPOFOL  N/A 06/30/2019  ? Procedure: COLONOSCOPY WITH PROPOFOL;  Surgeon: Ronald Lobo, MD;  Location: [place];  Service: Endoscopy;  Laterality: N/A;  ? ENTEROSCOPY N/A 08/02/2019  ? Procedure: ENTEROSCOPY;  Surgeon: Yetta Flock, MD;  Location: [facility] ENDOSCOPY;  Service: Gastroenterology;  Laterality: N/A;  ? ENTEROSCOPY N/A 02/24/2021  ? Procedure: ENTEROSCOPY;  Surgeon: [name], MD;  Location: [place];  Service:  Gastroenterology;  Laterality: N/A;  ? ENTEROSCOPY N/A 03/26/2021  ? Procedure: ENTEROSCOPY;  Surgeon: Lavena Bullion, DO;  Location: [place];  Service: Gastroenterology;  Laterality: N/A;  ? ENTEROSCOPY N/A 07/05/2021  ? Procedure: ENTEROSCOPY;  Surgeon: Carol Ada, MD;  Location: [place];  Service: Gastroenterology;  Laterality: N/A;  ? ESOPHAGOGASTRODUODENOSCOPY N/A 08/10/2020  ? Procedure: ESOPHAGOGASTRODUODENOSCOPY (EGD);  Surgeon: Jerene Bears, MD;  Location: [facility] ENDOSCOPY;  Service: Gastroenterology;  Laterality: N/A;  ? ESOPHAGOGASTRODUODENOSCOPY (EGD) WITH PROPOFOL N/A 06/30/2019  ? Procedure: ESOPHAGOGASTRODUODENOSCOPY (EGD) WITH PROPOFOL;  Surgeon: Ronald Lobo, MD;  Location: [place];  Service: Endoscopy;  Laterality: N/A;  ? ESOPHAGOGASTRODUODENOSCOPY (EGD) WITH PROPOFOL N/A 01/12/2021  ? Procedure: ESOPHAGOGASTRODUODENOSCOPY (EGD) WITH PROPOFOL;  Surgeon: Sharyn Creamer, MD;  Location: [place];  Service: Gastroenterology;  Laterality: N/A;  ? ESOPHAGOGASTRODUODENOSCOPY (EGD) WITH PROPOFOL N/A 02/08/2021  ? Procedure: ESOPHAGOGASTRODUODENOSCOPY (EGD) WITH PROPOFOL;  Surgeon: Sharyn Creamer, MD;  Location: [place];  Service: Gastroenterology;  Laterality: N/A;  ? FISTULA SUPERFICIALIZATION Left 10/17/2019  ? Procedure: LEFT UPPER EXTREMITY FISTULA REVISION, SIDE BRANCH LIGATION,  AND SUPERFICIALIZATION;  Surgeon: Marty Heck, MD;  Location: [place];  Service: Vascular;  Laterality: Left;  ? FISTULA SUPERFICIALIZATION Left 04/19/2021  ? Procedure: PLICATION OF ANEURYSM LEFT ARTERIOVENOUS FISTULA;  Surgeon: Angelia Mould, MD;  Location: [place];  Service: Vascular;  Laterality: Left;  ? FLEXIBLE SIGMOIDOSCOPY N/A 07/05/2021  ? Procedure: FLEXIBLE SIGMOIDOSCOPY;  Surgeon: Carol Ada, MD;  Location: [place];  Service: Gastroenterology;  Laterality: N/A;  ? GIVENS CAPSULE STUDY N/A 06/30/2019  ? Procedure: GIVENS CAPSULE STUDY;  Surgeon: Ronald Lobo, MD;   Location: [facility] ENDOSCOPY;  Service: Endoscopy;  Laterality: N/A;  ? HEMOSTASIS CLIP PLACEMENT  08/10/2020  ? Procedure: HEMOSTASIS CLIP PLACEMENT;  Surgeon: Jerene Bears, MD;  Location: [facility] ENDOSCOPY;  Service: Gastroenterology;;  ? HEMOSTASIS CLIP PLACEMENT  01/12/2021  ? Procedure: HEMOSTASIS CLIP PLACEMENT;  Surgeon: Sharyn Creamer, MD;  Location: [place];  Service: Gastroenterology;;  ? HEMOSTASIS CONTROL  08/02/2019  ? Procedure: HEMOSTASIS CONTROL;  Surgeon: Yetta Flock, MD;  Location: [place];  Service: Gastroenterology;;  ? HOT HEMOSTASIS  02/24/2021  ? Procedure: HOT HEMOSTASIS (ARGON PLASMA COAGULATION/BICAP);  Surgeon: [name], MD;  Location: [facility] ENDOSCOPY;  Service: Gastroenterology;;  ? HOT HEMOSTASIS N/A 03/26/2021  ? Procedure: HOT HEMOSTASIS (ARGON PLASMA COAGULATION/BICAP);  Surgeon: Lavena Bullion, DO;  Location: [facility] ENDOSCOPY;  Service: Gastroenterology;  Laterality: N/A;  ? HOT HEMOSTASIS N/A 07/05/2021  ? Procedure: HOT HEMOSTASIS (ARGON PLASMA COAGULATION/BICAP);  Surgeon: Carol Ada, MD;  Location: [place];  Service: Gastroenterology;  Laterality: N/A;  ? INCISION AND DRAINAGE ABSCESS N/A 06/29/2016  ? Procedure: INCISION AND DRAINAGE ABDOMINAL WALL ABSCESS;  Surgeon: Alphonsa Overall, MD;  Location: [facility];  Service: General;  Laterality: N/A;  ? INSERTION OF DIALYSIS CATHETER Right 08/03/2019  ? Procedure: INSERTION OF DIALYSIS CATHETER;  Surgeon: Waynetta Sandy, MD;  Location: [place];  Service: Vascular;  Laterality: Right;  ? INSERTION OF DIALYSIS CATHETER Right 10/22/2019  ? Procedure: INSERTION  OF 23CM TUNNELED DIALYSIS CATHETER RIGHT INTERNAL JUGULAR;  Surgeon: Angelia Mould, MD;  Location: [facility] OR;  Service: Vascular;  Laterality: Right;  ? INSERTION OF DIALYSIS CATHETER Right 08/12/2020  ? Procedure: INSERTION OF Right internal Jugular TUNNELED  DIALYSIS CATHETER.;  Surgeon: Waynetta Sandy, MD;  Location: [place];  Service: Vascular;   Laterality: Right;  ? INSERTION OF DIALYSIS CATHETER N/A 04/19/2021  ? Procedure: INSERTION OF TUNNELED DIALYSIS CATHETER;  Surgeon: Angelia Mould, MD;  Location: [place];  Service: Vascular;  Laterality:

## 2021-08-05 NOTE — H&P (View-Only) (Signed)
? ?Referring Provider: Dr. Shirlyn Goltz.  ?Primary Care Physician:  [name], NP ?Primary Gastroenterologist:  Althia Forts.  ? ?Reason for Consultation: Anemia, GI bleed  ? ? ?HPI: Daniel [name] is a 61 y.o. male with a past medical history of hypertension, peripheral arterial disease s/p right SFA stenting by Dr. Standley Dakins 06/2021 on Plavix and ASA 4m QD, intracerebral hemorrhage, end-stage renal disease on dialysis, chronic anemia, GI bleed secondary to bleeding duodenal ulcer and small bowel AVMs. S/P left BKA. ? ?[facility] dialysis center contacted the nursing staff at the SNF reporting a hemoglobin level of 5.7.  Labs in the ED showed a hemoglobin level of 4.6.  Hematocrit 14.8.  MCV 119.4.  Platelet 133.  Creatinine 8.58.  BUN 47 (baseline BUN 18).  AST 69.  ALT 26.  Total bili 0.7.  Alk phos 55.  Rectal exam by the ED physician showed dark stool. FOBT positive. Two units of PRBCs were ordered.  He was placed on Protonix infusion.  A GI consult was requested for further evaluation regarding anemia with GI bleeding.  ? ?He has a history of GI bleeding as a secondary to small bowel AVMs.  He underwent a small bowel enteroscopy by Dr. CBryan Lemmaduring a hospital admission 03/26/2021 which identified 2 nonbleeding angiectasia's in the duodenum treated with APC and none ulcerative gastritis.  He was most recently admitted to the hospital 07/03/2021 - 07/10/2021 secondary to bright red hematochezia following a dialysis session on 3/8.  Hemoglobin level was 8.0.  FOBT positive.  He underwent a small bowel enteroscopy by Dr. HBenson Norway3/01/2022 which showed friable gastric mucosa, a few recently bleeding angiectasia is noted in the duodenum treated with a monopolar probe and a single nonbleeding AVM lesion in the jejunum treated with a monopolar probe.  He underwent a flexible sigmoidoscopy which showed red blood in the rectum.  His hemoglobin stabilized  without evidence of further active GI bleeding and he was discharged home on aspirin 81 mg daily indefinitely and Plavix 75 mg x 1 month.  He is prescribed ferrous sulfate 325 mg daily, he is unsure if he is receiving it at the SNF. ? ?He stated he was not sure exactly why he was sent to the emergency room.  He denies having any nausea or vomiting.  No upper or lower abdominal pain.  He was previously passing a fairly normal darker brown stool most days, however, he started passing a few black solid stools for the past 3 to 4 days.  No bright red rectal bleeding.  He takes aspirin and Plavix as administered by the nursing staff at the SNF.  His last dose of Plavix was likely on 08/04/2021.  He complains of having left stump pain which has persisted since his left BKA surgery.  No chest pain or shortness of breath.  No other complaints at this time.  No family at the bedside. ? ?PAST GI PROCEDURES: ? ?Small bowel enteroscopy by Dr. HBenson Norway3/01/2022: ?- Normal esophagus. ?- Friable gastric mucosa. ?- A few recently bleeding angioectasias in the duodenum. Treated with a monopolar probe. ?- A single non-bleeding angiodysplastic lesion in the jejunum. Treated with a monopolar ?probe. ?- No specimens collected. ? ?Flexible sigmoidoscopy 07/05/2021: ?- Blood in the rectum, in the recto-sigmoid colon and in the sigmoid colon. ?- No specimens collected. ?-Abdominal/pelvic CT angiogram was recommended if he developed a rapid lower GI bleeding. ? ?Small bowel enteroscopy by  Dr. Bryan Lemma 03/26/2021: ?-Normal esophagus.  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 ?-Mild, nonulcerative gastritis.  Biopsied. ?-Mild inflammation with nodularity in the duodenal bulb.  This overall appeared improved from images on the prior studies. ?-2 nonbleeding angiectasia's in the duodenum.  Treated with argon plasma coagulation. ? ?EGD 08/10/2020: ?- Normal esophagus. ?- Gastritis. ?- Bleeding duodenal ?- Bleeding duodenal ulcer  with a visible vessel. Clips were placed x 3 with apparent ?hemostasis. ?- Duodenitis with nodularity and polypoid mucosa. ?- No specimens collected. ? ?Small bowel enteroscopy 08/02/2019: ?- Esophagogastric landmarks identified. ?- Normal esophagus. ?- Normal stomach. ?- Prominent ampulla without overt adenomatous changes. Biopsied. ?- A few benign appearing duodenal polyps. Biopsied, one suspected AVM treated as above. ?- A single non-bleeding angiodysplastic lesion in the duodenum. Treated with argon plasma ?coagulation (APC). ?- A single non-bleeding angiodysplastic lesion in the proximal jejunum. Treated with argon ?plasma coagulation ? ?EGD 06/30/2019: ?- No source of iron deficiency anemia or heme positive stoIonlp eantideonstcopically evident. ?- Normal larynx. ?- Normal esophagus. ?- Normal stomach. ?- Duodenal polyp vs. prominent papilla, benign-appearing. Biopsied. ? ?Colonoscopy 06/30/2019: ?- No active bleeding or source of heme positive stool/anemia seen. ?- Diverticulosis in the sigmoid colon. ?- Probable old blood in the terminal ileum, raising the question of a small bowel source ?(given negative egd). ?- The distal rectum and anal verge are normal on retroflexion view. ?- No specimens collected. ? ?Past Medical History:  ?Diagnosis Date  ? Anemia   ? Diabetes mellitus without complication ([place])   ? patient denies  ? Dialysis patient [facility])   ? End stage chronic kidney disease ([place])   ? Hypertension   ? ICH (intracerebral hemorrhage) ([place]) 05/20/2017  ? PAD (peripheral artery disease) ([place])   ? Shoulder pain, left 06/28/2013  ? ? ?Past Surgical History:  ?Procedure Laterality Date  ? A/V FISTULAGRAM N/A 08/15/2020  ? Procedure: A/V FISTULAGRAM - Left Upper;  Surgeon: Cherre Robins, MD;  Location: [place] [facility];  Service: Cardiovascular;  Laterality: N/A;  ? ABDOMINAL AORTOGRAM W/LOWER EXTREMITY N/A 04/08/2021  ? Procedure: ABDOMINAL AORTOGRAM W/LOWER EXTREMITY;  Surgeon: Waynetta Sandy,  MD;  Location: [place] [facility];  Service: Cardiovascular;  Laterality: N/A;  ? AMPUTATION Left 04/16/2021  ? Procedure: LEFT BELOW KNEE AMPUTATION;  Surgeon: Angelia Mould, MD;  Location: [facility] OR;  Service: Vascular;  Laterality: Left;  ? AMPUTATION Left 06/17/2021  ? Procedure: REVISION AMPUTATION BELOW KNEE;  Surgeon: Broadus John, MD;  Location: [place];  Service: Vascular;  Laterality: Left;  ? APPENDECTOMY    ? APPLICATION OF WOUND VAC Left 06/17/2021  ? Procedure: APPLICATION OF WOUND VAC;  Surgeon: Broadus John, MD;  Location: [place];  Service: Vascular;  Laterality: Left;  ? AV FISTULA PLACEMENT Left 08/03/2019  ? Procedure: LEFT ARM ARTERIOVENOUS (AV) CEPHALIC  FISTULA CREATION;  Surgeon: Waynetta Sandy, MD;  Location: [place];  Service: Vascular;  Laterality: Left;  ? BIOPSY  06/30/2019  ? Procedure: BIOPSY;  Surgeon: Ronald Lobo, MD;  Location: [place];  Service: Endoscopy;;  ? BIOPSY  08/02/2019  ? Procedure: BIOPSY;  Surgeon: Yetta Flock, MD;  Location: [facility] ENDOSCOPY;  Service: Gastroenterology;;  ? BIOPSY  02/08/2021  ? Procedure: BIOPSY;  Surgeon: Sharyn Creamer, MD;  Location: [facility] ENDOSCOPY;  Service: Gastroenterology;;  ? BIOPSY  02/24/2021  ? Procedure: BIOPSY;  Surgeon: [name], MD;  Location: [place];  Service: Gastroenterology;;  ? BIOPSY  03/26/2021  ? Procedure: BIOPSY;  Surgeon: Lavena Bullion, DO;  Location: [place];  Service: Gastroenterology;;  ? COLONOSCOPY  01/23/2012  ? Procedure: COLONOSCOPY;  Surgeon: Danie Binder, MD;  [facility];  Service: Endoscopy;  Laterality: N/A;  11:10 AM  ? COLONOSCOPY WITH PROPOFOL N/A 06/30/2019  ? Procedure: COLONOSCOPY WITH PROPOFOL;  Surgeon: Ronald Lobo, MD;  Location: [place];  Service: Endoscopy;  Laterality: N/A;  ? ENTEROSCOPY N/A 08/02/2019  ? Procedure: ENTEROSCOPY;  Surgeon: Yetta Flock, MD;  Location: [facility] ENDOSCOPY;  Service: Gastroenterology;  Laterality: N/A;   ? ENTEROSCOPY N/A 02/24/2021  ? Procedure: ENTEROSCOPY;  Surgeon: [name], MD;  Location: [place];  Service: Gastroenterology;  Laterality: N/A;  ? ENTEROSCOPY N/A 03/26/2021  ? Procedur

## 2021-08-05 NOTE — Progress Notes (Signed)
Pt arrived to [place]. Alert and oriented x4, identified appropriately. VS stable, no signs of distress. ?Cardiac monitor in place and CCMD notified. ?Pt oriented to room and equipment, instructed to use call bell for assistance, and call bell left within pt reach. ?Will continue to monitor pt and treat per MD orders. ?

## 2021-08-06 ENCOUNTER — Inpatient Hospital Stay (HOSPITAL_COMMUNITY): Payer: Medicare Other | Admitting: Anesthesiology

## 2021-08-06 ENCOUNTER — Inpatient Hospital Stay (HOSPITAL_COMMUNITY): Payer: Medicare Other

## 2021-08-06 ENCOUNTER — Encounter (HOSPITAL_COMMUNITY): Payer: Self-pay | Admitting: Internal Medicine

## 2021-08-06 ENCOUNTER — Encounter (HOSPITAL_COMMUNITY)
Admission: EM | Disposition: A | Discharge status: Home Health | Payer: Self-pay | Source: Home / Self Care | Attending: Internal Medicine

## 2021-08-06 DIAGNOSIS — K922 Gastrointestinal hemorrhage, unspecified: Secondary | ICD-10-CM | POA: Diagnosis not present

## 2021-08-06 DIAGNOSIS — C220 Liver cell carcinoma: Secondary | ICD-10-CM | POA: Diagnosis present

## 2021-08-06 DIAGNOSIS — K3189 Other diseases of stomach and duodenum: Secondary | ICD-10-CM

## 2021-08-06 DIAGNOSIS — K317 Polyp of stomach and duodenum: Secondary | ICD-10-CM

## 2021-08-06 DIAGNOSIS — N186 End stage renal disease: Secondary | ICD-10-CM | POA: Diagnosis not present

## 2021-08-06 DIAGNOSIS — K2970 Gastritis, unspecified, without bleeding: Secondary | ICD-10-CM

## 2021-08-06 DIAGNOSIS — D62 Acute posthemorrhagic anemia: Secondary | ICD-10-CM

## 2021-08-06 DIAGNOSIS — K921 Melena: Secondary | ICD-10-CM

## 2021-08-06 DIAGNOSIS — K2971 Gastritis, unspecified, with bleeding: Secondary | ICD-10-CM

## 2021-08-06 DIAGNOSIS — K769 Liver disease, unspecified: Secondary | ICD-10-CM | POA: Diagnosis present

## 2021-08-06 DIAGNOSIS — Z992 Dependence on renal dialysis: Secondary | ICD-10-CM | POA: Diagnosis not present

## 2021-08-06 DIAGNOSIS — R918 Other nonspecific abnormal finding of lung field: Secondary | ICD-10-CM | POA: Diagnosis present

## 2021-08-06 DIAGNOSIS — K2081 Other esophagitis with bleeding: Secondary | ICD-10-CM

## 2021-08-06 HISTORY — PX: ENTEROSCOPY: SHX5533

## 2021-08-06 HISTORY — PX: BIOPSY: SHX5522

## 2021-08-06 LAB — CBC WITH DIFFERENTIAL/PLATELET
Abs Immature Granulocytes: 0.05 10*3/uL (ref 0.00–0.07)
Basophils Absolute: 0 10*3/uL (ref 0.0–0.1)
Basophils Relative: 0 %
Eosinophils Absolute: 0.2 10*3/uL (ref 0.0–0.5)
Eosinophils Relative: 4 %
HCT: 22.4 % — ABNORMAL LOW (ref 39.0–52.0)
Hemoglobin: 7.2 g/dL — ABNORMAL LOW (ref 13.0–17.0)
Immature Granulocytes: 1 %
Lymphocytes Relative: 22 %
Lymphs Abs: 1.3 10*3/uL (ref 0.7–4.0)
MCH: 32.3 pg (ref 26.0–34.0)
MCHC: 32.1 g/dL (ref 30.0–36.0)
MCV: 100.4 fL — ABNORMAL HIGH (ref 80.0–100.0)
Monocytes Absolute: 0.7 10*3/uL (ref 0.1–1.0)
Monocytes Relative: 12 %
Neutro Abs: 3.5 10*3/uL (ref 1.7–7.7)
Neutrophils Relative %: 61 %
Platelets: 129 10*3/uL — ABNORMAL LOW (ref 150–400)
RBC: 2.23 MIL/uL — ABNORMAL LOW (ref 4.22–5.81)
RDW: 26.1 % — ABNORMAL HIGH (ref 11.5–15.5)
WBC: 5.7 10*3/uL (ref 4.0–10.5)
nRBC: 0.5 % — ABNORMAL HIGH (ref 0.0–0.2)

## 2021-08-06 LAB — CBC
HCT: 19 % — ABNORMAL LOW (ref 39.0–52.0)
HCT: 22.2 % — ABNORMAL LOW (ref 39.0–52.0)
Hemoglobin: 6.3 g/dL — CL (ref 13.0–17.0)
Hemoglobin: 7.5 g/dL — ABNORMAL LOW (ref 13.0–17.0)
MCH: 34.6 pg — ABNORMAL HIGH (ref 26.0–34.0)
MCH: 33.8 pg (ref 26.0–34.0)
MCHC: 33.2 g/dL (ref 30.0–36.0)
MCHC: 33.8 g/dL (ref 30.0–36.0)
MCV: 104.4 fL — ABNORMAL HIGH (ref 80.0–100.0)
MCV: 100 fL (ref 80.0–100.0)
Platelets: 136 10*3/uL — ABNORMAL LOW (ref 150–400)
Platelets: 129 10*3/uL — ABNORMAL LOW (ref 150–400)
RBC: 1.82 MIL/uL — ABNORMAL LOW (ref 4.22–5.81)
RBC: 2.22 MIL/uL — ABNORMAL LOW (ref 4.22–5.81)
RDW: 24.9 % — ABNORMAL HIGH (ref 11.5–15.5)
RDW: 26.2 % — ABNORMAL HIGH (ref 11.5–15.5)
WBC: 5.1 10*3/uL (ref 4.0–10.5)
WBC: 4.7 10*3/uL (ref 4.0–10.5)
nRBC: 0.6 % — ABNORMAL HIGH (ref 0.0–0.2)
nRBC: 0.6 % — ABNORMAL HIGH (ref 0.0–0.2)

## 2021-08-06 LAB — PREPARE RBC (CROSSMATCH)

## 2021-08-06 LAB — BASIC METABOLIC PANEL
Anion gap: 13 (ref 5–15)
BUN: 51 mg/dL — ABNORMAL HIGH (ref 8–23)
CO2: 26 mmol/L (ref 22–32)
Calcium: 8.5 mg/dL — ABNORMAL LOW (ref 8.9–10.3)
Chloride: 95 mmol/L — ABNORMAL LOW (ref 98–111)
Creatinine, Ser: 9.05 mg/dL — ABNORMAL HIGH (ref 0.61–1.24)
GFR, Estimated: 6 mL/min — ABNORMAL LOW (ref >60)
Glucose, Bld: 80 mg/dL (ref 70–99)
Potassium: 4.7 mmol/L (ref 3.5–5.1)
Sodium: 134 mmol/L — ABNORMAL LOW (ref 135–145)

## 2021-08-06 LAB — HEPATITIS B SURFACE ANTIGEN: Hepatitis B Surface Ag: NONREACTIVE

## 2021-08-06 LAB — HEPATITIS B CORE ANTIBODY, TOTAL: Hep B Core Total Ab: REACTIVE — AB

## 2021-08-06 LAB — GLUCOSE, CAPILLARY: Glucose-Capillary: 77 mg/dL (ref 70–99)

## 2021-08-06 SURGERY — ENTEROSCOPY
Anesthesia: Monitor Anesthesia Care

## 2021-08-06 MED ORDER — SODIUM CHLORIDE 0.9 % IV SOLN: INTRAVENOUS | Status: DC | PRN

## 2021-08-06 MED ORDER — HYDROCODONE-ACETAMINOPHEN 5-325 MG PO TABS
1.0000 | ORAL_TABLET | Freq: Four times a day (QID) | ORAL | Status: DC | PRN
  Administered 2021-08-06: 1 via ORAL
  Administered 2021-08-06 – 2021-08-10 (×5): 2 via ORAL
  Filled 2021-08-06 (×3): qty 2
  Filled 2021-08-06: qty 1
  Filled 2021-08-06 (×2): qty 2

## 2021-08-06 MED ORDER — SODIUM CHLORIDE 0.9 % IV SOLN
INTRAVENOUS | Status: AC | PRN
  Administered 2021-08-06: 500 mL via INTRAMUSCULAR

## 2021-08-06 MED ORDER — PROPOFOL 500 MG/50ML IV EMUL
INTRAVENOUS | Status: DC | PRN
  Administered 2021-08-06: 125 ug/kg/min via INTRAVENOUS

## 2021-08-06 MED ORDER — PROPOFOL 10 MG/ML IV BOLUS
INTRAVENOUS | Status: DC | PRN
Start: 2021-08-06 — End: 2021-08-06
  Administered 2021-08-06: 20 mg via INTRAVENOUS

## 2021-08-06 MED ORDER — SODIUM CHLORIDE 0.9% IV SOLUTION: Freq: Once | INTRAVENOUS | Status: AC

## 2021-08-06 MED ORDER — CHLORHEXIDINE GLUCONATE CLOTH 2 % EX PADS
6.0000 | MEDICATED_PAD | Freq: Every day | CUTANEOUS | Status: DC
  Administered 2021-08-06: 6 via TOPICAL

## 2021-08-06 NOTE — Anesthesia Postprocedure Evaluation (Signed)
Anesthesia Post Note ? ?Patient: Daniel [name] ? ?Procedure(s) Performed: ENTEROSCOPY ?BIOPSY ? ?  ? ?Patient location during evaluation: Endoscopy ?Anesthesia Type: MAC ?Level of consciousness: awake and alert ?Pain management: pain level controlled ?Vital Signs Assessment: post-procedure vital signs reviewed and stable ?Respiratory status: spontaneous breathing, nonlabored ventilation, respiratory function stable and patient connected to nasal cannula oxygen ?Cardiovascular status: stable and blood pressure returned to baseline ?Postop Assessment: no apparent nausea or vomiting ?Anesthetic complications: no ? ? ?No notable events documented. ? ?Last Vitals:  ?Vitals:  ? 08/06/21 1530 08/06/21 1600  ?BP: (!) 168/54 (!) 159/61  ?Pulse: 88 89  ?Resp:  15  ?Temp:    ?SpO2:    ?  ?Last Pain:  ?Vitals:  ? 08/06/21 1250  ?TempSrc: Tympanic  ?PainSc:   ? ? ?  ?  ?  ?  ?  ?  ? ?[name] Kymani Laursen ? ? ? ? ?

## 2021-08-06 NOTE — Assessment & Plan Note (Addendum)
Patient with previous history of GI bleed secondary to AVMs.  Came in with few day history of melanotic stools.  Hemoglobin was 4.6 on admission.  Patient was hospitalized for further management. ?Gastroenterology was consulted.  Patient underwent small bowel enteroscopy.  Revealed esophagitis without active bleeding.  Biopsies were taken.  Gastritis was noted.  Multiple duodenal polyps were noted.  No overt bleeding was present. ?Patient required additional transfusion.  Subsequently his hemoglobin remained stable.  He did have a few episodes of black stool but thought this was likely old blood.  Gastroenterology agreed with this assessment.  Stable for discharge today.  Continue PPI. ?

## 2021-08-06 NOTE — Assessment & Plan Note (Addendum)
Dialyzed on Monday Wednesday Friday schedule.   ?

## 2021-08-06 NOTE — Progress Notes (Signed)
Hemodialysis- Tolerated well. 3L removed as ordered. Patient without complaints. Did have snack. Report called to 5N.  ?

## 2021-08-06 NOTE — Progress Notes (Signed)
?[facility] ?Progress Note  ? ?Subjective:   Seen in room, seated on bedside in NAD. Reports he is going to enteroscopy soon. Mildly SOB today. Denies CP, dizziness, palpitations, abdominal pain, N/V/D. Hgb 6.3 this AM. Did not get HD yesterday due to staffing, planned for dialysis after his procedure today.  ? ?Objective ?Vitals:  ? 08/06/21 0434 08/06/21 0517 08/06/21 0535 08/06/21 0746  ?BP: 138/64 (!) 144/78 (!) 149/53 (!) 163/89  ?Pulse:  80 81 80  ?Resp: '12 15 16 15  '$ ?Temp: 97.8 ?F (36.6 ?C) 97.9 ?F (36.6 ?C) 97.8 ?F (36.6 ?C) 97.8 ?F (36.6 ?C)  ?TempSrc: Oral Oral Oral Oral  ?SpO2: 98% 95% 99% 97%  ?Weight:      ?Height:      ? ?Physical Exam ?General: WDWN male, alert and in NAD ?Heart: RRR, no murmurs, rubs or gallops ?Lungs: CTA bilaterally without wheezing, rhonchi or rales ?Abdomen: Soft, non-distended, +BS ?Extremities: 1+ pitting edema RLE ?Dialysis Access: LUE AVF  +t/b ? ?Additional Objective ?Labs: ?Basic Metabolic Panel: ?Recent Labs  ?Lab 08/05/21 ?1153 08/05/21 ?1224 08/06/21 ?0352  ?NA 136 137 134*  ?K 4.2 4.3 4.7  ?CL 98 97* 95*  ?CO2 26  --  26  ?GLUCOSE 105* 102* 80  ?BUN 47* 50* 51*  ?CREATININE 8.58* 9.80* 9.05*  ?CALCIUM 8.6*  --  8.5*  ? ?Liver Function Tests: ?Recent Labs  ?Lab 08/05/21 ?1153  ?AST 69*  ?ALT 26  ?ALKPHOS 55  ?BILITOT 0.7  ?PROT 6.9  ?ALBUMIN 2.5*  ? ?No results for input(s): LIPASE, AMYLASE in the last 168 hours. ?CBC: ?Recent Labs  ?Lab 08/05/21 ?1153 08/05/21 ?1224 08/05/21 ?2010 08/06/21 ?0352  ?WBC 4.7  --   --  5.1  ?NEUTROABS 2.5  --   --   --   ?HGB 4.6* 5.4* 5.3* 6.3*  ?HCT 14.8* 16.0* 17.3* 19.0*  ?MCV 119.4*  --   --  104.4*  ?PLT 133*  --   --  136*  ? ?Blood Culture ?   ?Component Value Date/Time  ? SDES BLOOD RIGHT HAND 04/14/2021 1035  ? SPECREQUEST  04/14/2021 1035  ?  BOTTLES DRAWN AEROBIC AND ANAEROBIC Blood Culture adequate volume  ? CULT  04/14/2021 1035  ?  NO GROWTH 5 DAYS ?Performed at [facility], [place] [address]  [place], [place] [address] ?  ? REPTSTATUS 04/19/2021 FINAL 04/14/2021 1035  ? ? ?Cardiac Enzymes: ?No results for input(s): CKTOTAL, CKMB, CKMBINDEX, TROPONINI in the last 168 hours. ?CBG: ?No results for input(s): GLUCAP in the last 168 hours. ?Iron Studies: No results for input(s): IRON, TIBC, TRANSFERRIN, FERRITIN in the last 72 hours. ?'@lablastinr3'$ @ ?Studies/Results: ?CT Angio Abd/Pel w/ and/or w/o ? ?Result Date: 08/05/2021 ?CLINICAL DATA:  GI bleed, melena, history of duodenal ulcer, anemia and end-stage renal disease. EXAM: CT ANGIOGRAPHY ABDOMEN AND PELVIS WITH CONTRAST AND WITHOUT CONTRAST TECHNIQUE: Multidetector CT imaging of the abdomen and pelvis was performed using the standard protocol during bolus administration of intravenous contrast. Multiplanar reconstructed images and MIPs were obtained and reviewed to evaluate the vascular anatomy. RADIATION DOSE REDUCTION: This exam was performed according to the departmental dose-optimization program which includes automated exposure control, adjustment of the mA and/or kV according to patient size and/or use of iterative reconstruction technique. CONTRAST:  143m OMNIPAQUE IOHEXOL 350 MG/ML SOLN COMPARISON:  CTA of the abdomen with bilateral runoff on 07/03/2021 FINDINGS: VASCULAR Aorta: Atherosclerosis of the abdominal aorta without evidence of aneurysm or stenosis. No dissection identified  Celiac: Atherosclerosis at origin of celiac axis without significant stenosis. Distal branch vessels demonstrate normal patency and normal branching anatomy. SMA: Mild atherosclerosis at the origin of the SMA and within the SMA trunk. No significant stenosis identified. Renals: Bilateral single renal arteries demonstrate no significant stenosis. IMA: Normally patent. Inflow: Atherosclerosis of bilateral common, internal and external iliac arteries without significant obstructive disease or aneurysmal disease. Proximal Outflow: Calcified bilateral common femoral arteries  without significant stenosis. Femoral bifurcations demonstrate normal patency bilaterally. Veins: Venous phase imaging demonstrates normal patency of venous structures including the portal vein, splenic vein, visualized mesenteric veins, IVC, renal veins, iliac veins and common femoral veins. Review of the MIP images confirms the above findings. NON-VASCULAR Lower chest: Bibasilar scarring and atelectasis and trace bilateral pleural effusions. Hepatobiliary: On the venous phase of imaging which represents a delayed phase, there is visualization of a rounded low-attenuation mass within the central liver measuring approximately 4.4 x 4.2 x 4.4 cm. This mass is near the juncture of segment IVa and VIII. There is also suggestion of an adjacent less well-defined lesion within segment VIII measuring roughly 2.2 cm. These lesions are not well visualized on the arterial phase. Main differential considerations are some type of well differentiated carcinoma such as hepatocellular carcinoma or neuroendocrine carcinoma. Metastatic disease or cholangiocarcinoma is not excluded. Further evaluation with MRI is warranted. The gallbladder is contracted. Single stone present in the neck of the gallbladder. No biliary ductal dilatation identified. There are a few scattered calcified granulomata in the liver. Pancreas: Unremarkable. No pancreatic ductal dilatation or surrounding inflammatory changes. Spleen: Normal in size without focal abnormality. Adrenals/Urinary Tract: No adrenal masses. The kidneys are atrophic bilaterally consistent with known end-stage renal disease. Nonobstructing tiny calculus in the lower pole of the left kidney. No evidence of hydronephrosis. The bladder is unremarkable. Stomach/Bowel: There is suggestion of irregularity along the medial aspect of the proximal duodenum near the expected position of the duodenal bulb or post bulbar duodenum. This is highly suspicious for focal ulcer. Some associated stranding  in the adjacent fat abuts the pancreatic head and is consistent with edema. No extraluminal air or focal abscess is identified. No evidence of bowel obstruction, ileus or free intraperitoneal air. Mild diverticulosis of the sigmoid colon. During arterial and venous phases of imaging, there is no extravasation of intravascular contrast into the gastrointestinal tract. Lymphatic: No enlarged lymph nodes identified. Reproductive: Prostate is unremarkable. Other: No ascites.  No abdominal wall hernia. Musculoskeletal: No acute or significant osseous findings. IMPRESSION: 1. No active bleeding is identified into the gastrointestinal tract during arterial and venous phases of CT imaging. 2. Irregular outpouching along the medial aspect of the proximal duodenum near the expected position of the duodenal bulb or post bulbar duodenum. This is highly suspicious for a focal duodenal ulcer. There is some adjacent edema in the retroperitoneal fat. This may be the source of GI bleeding clinically. 3. Detection of a rounded 4.4 cm central hepatic mass near the juncture of segments IVb and VIII as well as a possible adjacent 2.2 cm mass in segment VIII. Main differential considerations are some type of well differentiated carcinoma such as hepatocellular carcinoma or neuroendocrine carcinoma. Metastatic disease or cholangiocarcinoma is not excluded. Further evaluation with MRI of the abdomen with and without contrast is warranted. Electronically Signed   By: Aletta Edouard M.D.   On: 08/05/2021 15:18   ?Medications: ? sodium chloride 500 mL (08/06/21 0945)  ? pantoprazole 8 mg/hr (08/05/21 2325)  ? ? [  MAR Hold] aspirin EC  81 mg Oral Daily  ? [MAR Hold] atorvastatin  40 mg Oral Daily  ? [MAR Hold] calcium acetate  667 mg Oral TID WC  ? [MAR Hold] Chlorhexidine Gluconate Cloth  6 each Topical Q0600  ? [MAR Hold] ferrous sulfate  325 mg Oral Q breakfast  ? [MAR Hold] gabapentin  100 mg Oral TID  ? [MAR Hold] lidocaine  1  application. Topical Q M,W,F  ? [MAR Hold] nicotine  7 mg Transdermal Daily  ? [MAR Hold] saccharomyces boulardii  250 mg Oral BID  ? ? ?Dialysis Orders: ?[facility] MWF 4 hours BFR 450 DFR 500 EDW 117kg 2K 2Ca no heparin

## 2021-08-06 NOTE — Interval H&P Note (Signed)
History and Physical Interval Note: ? ?08/06/2021 ?11:03 AM ? ?Daniel [name]  has presented today for surgery, with the diagnosis of GI bleed.  The various methods of treatment have been discussed with the patient and family. After consideration of risks, benefits and other options for treatment, the patient has consented to  Procedure(s): ?ENTEROSCOPY (N/A) as a surgical intervention.  The patient's history has been reviewed, patient examined, no change in status, stable for surgery.  I have reviewed the patient's chart and labs.  Questions were answered to the patient's satisfaction.   ? ?Patient received 3 units pRBCs, hgb increased from 4.6 to 7.1 this am.  Patient states his last bowel movement was at around 8 pm and was dark.  Plan for repeat enteroscopy today for suspected bleeding AVMs ? ?[name] ? ? ?

## 2021-08-06 NOTE — Anesthesia Preprocedure Evaluation (Signed)
Anesthesia Evaluation  ?Patient identified by MRN, date of birth, ID band ?Patient awake ? ? ? ?Reviewed: ?Allergy & Precautions, NPO status , Patient's Chart, lab work & pertinent test results ? ?Airway ?Mallampati: II ? ?TM Distance: >3 FB ?Neck ROM: Full ? ? ? Dental ?no notable dental hx. ? ?  ?Pulmonary ?neg pulmonary ROS, Current Smoker and Patient abstained from smoking.,  ?  ?Pulmonary exam normal ? ? ? ? ? ? ? Cardiovascular ?hypertension, Pt. on medications ?+ Peripheral Vascular Disease and +CHF  ? ?Rhythm:Regular Rate:Normal ? ? ?  ?Neuro/Psych ?negative neurological ROS ? negative psych ROS  ? GI/Hepatic ?GERD  Medicated,(+) Hepatitis -  ?Endo/Other  ?diabetes, Type 2, Insulin Dependent ? Renal/GU ?ESRF and DialysisRenal disease  ?negative genitourinary ?  ?Musculoskeletal ? ? Abdominal ?Normal abdominal exam  (+)   ?Peds ? Hematology ? ?(+) Blood dyscrasia, anemia ,   ?Anesthesia Other Findings ? ? Reproductive/Obstetrics ? ?  ? ? ? ? ? ? ? ? ? ? ? ? ? ?  ?  ? ? ? ? ? ? ? ? ?Anesthesia Physical ?Anesthesia Plan ? ?ASA: 3 ? ?Anesthesia Plan: MAC  ? ?Post-op Pain Management:   ? ?Induction: Intravenous ? ?PONV Risk Score and Plan: Propofol infusion and Treatment may vary due to age or medical condition ? ?Airway Management Planned: Simple Face Mask, Natural Airway and Nasal Cannula ? ?Additional Equipment: None ? ?Intra-op Plan:  ? ?Post-operative Plan:  ? ?Informed Consent: I have reviewed the patients History and Physical, chart, labs and discussed the procedure including the risks, benefits and alternatives for the proposed anesthesia with the patient or authorized representative who has indicated his/her understanding and acceptance.  ? ? ? ?Dental advisory given ? ?Plan Discussed with: CRNA ? ?Anesthesia Plan Comments: (Lab Results ?     Component                Value               Date                 ?     WBC                      5.7                 08/06/2021            ?     HGB                      7.2 (L)             08/06/2021           ?     HCT                      22.4 (L)            08/06/2021           ?     MCV                      100.4 (H)           08/06/2021           ?     PLT                      PENDING  08/06/2021           ?Lab Results ?     Component                Value               Date                 ?     NA                       134 (L)             08/06/2021           ?     K                        4.7                 08/06/2021           ?     CO2                      26                  08/06/2021           ?     GLUCOSE                  80                  08/06/2021           ?     BUN                      51 (H)              08/06/2021           ?     CREATININE               9.05 (H)            08/06/2021           ?     CALCIUM                  8.5 (L)             08/06/2021           ?     EGFR                     9 (L)               08/22/2020           ?     GFRNONAA                 6 (L)               08/06/2021           ?ECHO 02/23: ??1. Left ventricular ejection fraction, by estimation, is 55 to 60%. The  ?left ventricle has normal function. The left ventricle has no regional  ?wall motion abnormalities. The left ventricular internal cavity size was  ?mildly dilated. There is mild left  ?ventricular hypertrophy. Left ventricular diastolic parameters are  ?indeterminate.  ??2. Right ventricular systolic function is normal. The right ventricular  ?size is normal.  There is moderately elevated pulmonary artery systolic  ?pressure. The estimated right ventricular systolic pressure is 51.1 mmHg.  ??3. Left atrial size was moderately dilated.  ??4. Right atrial size was mildly dilated.  ??5. The mitral valve is normal in structure. Trivial mitral valve  ?regurgitation. No evidence of mitral stenosis.  ??6. The aortic valve is tricuspid. Aortic valve regurgitation is not  ?visualized. Aortic valve sclerosis/calcification is  present, without any  ?evidence of aortic stenosis.  ??7. The inferior vena cava is dilated in size with <50% respiratory  ?variability, suggesting right atrial pressure of 15 mmHg. )  ? ? ? ? ? ? ?Anesthesia Quick Evaluation ? ?

## 2021-08-06 NOTE — Assessment & Plan Note (Addendum)
Followed by Dr. Valeta Harms and Dr. Julien Nordmann for same.  Recently underwent PET scan.  Outpatient follow-up with oncology. ?

## 2021-08-06 NOTE — Assessment & Plan Note (Addendum)
Hemoglobin was 4.6 at presentation.  Status post multiple blood transfusions.  Hemoglobin stable for the last 48 hours. ?

## 2021-08-06 NOTE — Op Note (Signed)
[facility] ?Patient Name: Daniel [name] ?Procedure Date : 08/06/2021 ?MRN: [id number] ?Attending MD: Gladstone Pih. Candis Schatz , MD ?Date of Birth: [birth year] ?CSN: [id number] ?Age: 61 ?Admit Type: Inpatient ?Procedure:                Small bowel enteroscopy ?Indications:              Recurrent gastrointestinal bleeding ?Providers:                Gladstone Pih. Candis Schatz, MD, Ervin Knack, RN, Hope  ?                          Jerline Pain, Technician ?Referring MD:              ?Medicines:                Monitored Anesthesia Care ?Complications:            No immediate complications. ?Estimated Blood Loss:     Estimated blood loss was minimal. ?Procedure:                Pre-Anesthesia Assessment: ?                          - Prior to the procedure, a History and Physical  ?                          was performed, and patient medications and  ?                          allergies were reviewed. The patient's tolerance of  ?                          previous anesthesia was also reviewed. The risks  ?                          and benefits of the procedure and the sedation  ?                          options and risks were discussed with the patient.  ?                          All questions were answered, and informed consent  ?                          was obtained. Prior Anticoagulants: The patient has  ?                          taken Plavix (clopidogrel), last dose was 1 day  ?                          prior to procedure. ASA Grade Assessment: III - A  ?                          patient with severe systemic disease. After  ?  reviewing the risks and benefits, the patient was  ?                          deemed in satisfactory condition to undergo the  ?                          procedure. ?                          After obtaining informed consent, the endoscope was  ?                          passed under direct vision. Throughout the  ?                          procedure, the patient's  blood pressure, pulse, and  ?                          oxygen saturations were monitored continuously. The  ?                          PCF-190TL ([id number]) Olympus colonoscope was  ?                          introduced through the mouth and advanced to the  ?                          mid-jejunum. The small bowel enteroscopy was  ?                          accomplished without difficulty. The patient  ?                          tolerated the procedure well. ?Scope In: ?Scope Out: ?Findings: ?     Mildly severe esophagitis with no bleeding was found in the distal  ?     esophagus. Biopsies were taken with a cold forceps for histology.  ?     Estimated blood loss was minimal. ?     The exam of the esophagus was otherwise normal. ?     Diffuse moderate inflammation characterized by nodularity was found in  ?     the gastric body. ?     Localized moderately friable mucosa with contact bleeding was found at  ?     the pylorus. Biopsies were taken with a cold forceps for histology.  ?     Estimated blood loss was minimal. ?     Multiple medium-sized sessile polyps with no bleeding were found in the  ?     duodenal bulb. Biopsies were taken with a cold forceps for histology.  ?     Estimated blood loss was minimal. ?     The exam of the duodenum was otherwise normal. ?     There was no evidence of significant pathology in the proximal jejunum  ?     and in the mid-jejunum. ?Impression:               - Mildly severe esophagitis with no bleeding.  ?  Biopsied. Given localized circumferential  ?                          inflammation, suspect pill esophagitis. ?                          - Gastritis. ?                          - Friable gastric mucosa. Biopsied. ?                          - Multiple duodenal polyps. Biopsied. These are  ?                          most likely reactive/hyperplastic, not adenomatous.  ?                          These polyps were friable with contact oozing, and  ?                           thus may be a source of GI bleeding. No AVMs were  ?                          seen on this examination. ?                          - The examined portion of the jejunum was normal. ?Recommendation:           - Return patient to hospital ward for ongoing care. ?                          - Resume previous diet. ?                          - Use a proton pump inhibitor PO daily. ?                          - Continue to hold Plavix. Confer with vascular  ?                          surgery whether this can be discontinued  ?                          indefinitely or not. ?                          - Although removal of the duodenal polyps may  ?                          reduce risk of bleeding, given the number and close  ?                          proximity of the polyps in the duodenum, this would  ?  be a fairly high risk intervention, and there is no  ?                          guarantee this would prevent further bleeding, as  ?                          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 ?                          - Await pathology results. ?                          - Continue to monitor for signs/symptoms of  ?                          rebleeding. ?Procedure Code(s):        --- Professional --- ?                          [phone], Small intestinal endoscopy, enteroscopy  ?                          beyond second portion of duodenum, not including  ?                          ileum; with biopsy, single or multiple ?Diagnosis Code(s):        --- Professional --- ?                          K20.80, Other esophagitis without bleeding ?                          K29.70, Gastritis, unspecified, without bleeding ?                          K31.89, Other diseases of stomach and duodenum ?                          K31.7, Polyp of stomach and duodenum ?                          K92.2, Gastrointestinal hemorrhage, unspecified ?CPT copyright 2019 [employer]. All rights  reserved. ?The codes documented in this report are preliminary and upon coder review may  ?be revised to meet current compliance requirements. ?Katelen Luepke E. Candis Schatz, MD ?08/06/2021 12:01:13 PM ?This report has been signed electronically. ?Number of Addenda: 0 ?

## 2021-08-06 NOTE — Progress Notes (Addendum)
? ?[facility] ?PROGRESS NOTE ? ? ?Daniel [name]:[id number] DOB: 1960/07/29 DOA: 08/05/2021  1 ?DOS: the patient was seen and examined on 08/06/2021 ? ?PCP: [name], NP ? ?Brief History and Hospital Course:  ?61 y.o. male with medical history significant of recurrent GI bleed with history of multiple AV malformations, chronic iron deficiency anemia, diverticulosis, ESRD on HD, PAD status post left BKA, HTN, IIDM, came with worsening of black tarry stool and low hemoglobin counts. Patient was recently hospitalized for GI bleed in March.  EGD showed nonbleeding duodenal ulcer and colonic diverticulosis.  Presented with a hemoglobin of 4.6.  Started on blood transfusion.  Hospitalized for further management. ? ?Consultants: Gastroenterology.  Nephrology ? ?Procedures: Push enteroscopy planned for today. ? ? ?Subjective: ?Denies any chest pain shortness of breath.  No abdominal pain.  Complains of pain in his right foot which has been ongoing for several months.  Except for edema no other abnormalities noted on examination.  He has reasonable range of motion of the right ankle.  X-ray of the right foot done in December did not show any acute bony abnormality. ? ? ? ?Assessment/Plan: ? ? ?* Acute GI bleeding ?Patient with previous history of GI bleed secondary to AVMs.  Came in with few day history of melanotic stools.  Hemoglobin was 4.6 on admission.  Patient was hospitalized for further management. ?Gastroenterology has been consulted.  Plan is for push enteroscopy today. ?Remains on PPI infusion. ? ?Acute blood loss anemia ?Status post multiple blood transfusions.  Additional unit ordered this morning for hemoglobin of less than 7.  Continue to monitor. ? ?ESRD (end stage renal disease) on dialysis [facility]) ?Dialyzed on Monday Wednesday Friday schedule.  Could not be dialyzed yesterday.  Nephrology is following.  Plan is for dialysis today.  Seems to be stable. ? ?Hepatic lesion ?Patient was  found to have a lung lesion for which he was seen by pulmonology.  Underwent PET scan in March which showed lesion in the liver for which MRI was recommended.  Patient was seen by Dr. Valeta Harms recently who has ordered outpatient MRI. ? ?Lung mass ?Followed by Dr. Valeta Harms for same. ? ?PAD (peripheral artery disease) ([place]) ?Right SFA stenting on March 13th.  Per vascular surgery it was okay to stop the Plavix since it has been a month. They will like to continue aspirin alone if possible. ? ? ?Obesity ?Estimated body mass index is 36.53 kg/m? as calculated from the following: ?  Height as of this encounter: '5\' 11"'$  (1.803 m). ?  Weight as of this encounter: 118.8 kg. ? ? ?DVT Prophylaxis: SCDs ?Code Status: Full code ?Family Communication: Discussed with patient ?Disposition Plan: Hopefully return home when improved ? ?Status is: Inpatient ?Remains inpatient appropriate because: GI bleed, severe anemia ? ? ? ? ?Medications: Scheduled: ? [MAR Hold] aspirin EC  81 mg Oral Daily  ? [MAR Hold] atorvastatin  40 mg Oral Daily  ? [MAR Hold] calcium acetate  667 mg Oral TID WC  ? [MAR Hold] Chlorhexidine Gluconate Cloth  6 each Topical Q0600  ? Chlorhexidine Gluconate Cloth  6 each Topical Q0600  ? [MAR Hold] ferrous sulfate  325 mg Oral Q breakfast  ? [MAR Hold] gabapentin  100 mg Oral TID  ? [MAR Hold] lidocaine  1 application. Topical Q M,W,F  ? [MAR Hold] nicotine  7 mg Transdermal Daily  ? [MAR Hold] saccharomyces boulardii  250 mg Oral BID  ? ?Continuous: ? pantoprazole 8 mg/hr (  08/05/21 2325)  ? ?PRN:[MAR Hold] hydrALAZINE, [MAR Hold] HYDROcodone-acetaminophen, [MAR Hold] hydrOXYzine, [MAR Hold] loperamide, [MAR Hold] ondansetron **OR** [MAR Hold] ondansetron (ZOFRAN) IV ? ?Antibiotics: ?Anti-infectives (From admission, onward)  ? ? None  ? ?  ? ? ?Objective: ? ?Vital Signs ? ?Vitals:  ? 08/06/21 0746 08/06/21 0956 08/06/21 1155 08/06/21 1210  ?BP: (!) 163/89 (!) 156/77 (!) 150/59 (!) 142/60  ?Pulse: 80 79 78 80  ?Resp: '15  16 14 17  '$ ?Temp: 97.8 ?F (36.6 ?C)  (!) 97.5 ?F (36.4 ?C) 97.6 ?F (36.4 ?C)  ?TempSrc: Oral     ?SpO2: 97% 97% 99% 98%  ?Weight:  118.8 kg    ?Height:  '5\' 11"'$  (1.803 m)    ? ? ?Intake/Output Summary (Last 24 hours) at 08/06/2021 1239 ?Last data filed at 08/06/2021 1149 ?Gross per 24 hour  ?Intake 1160.42 ml  ?Output --  ?Net 1160.42 ml  ? ?Filed Weights  ? 08/05/21 1051 08/05/21 2032 08/06/21 0956  ?Weight: 113.4 kg 118.8 kg 118.8 kg  ? ? ?General appearance: Awake alert.  In no distress ?Resp: Clear to auscultation bilaterally.  Normal effort ?Cardio: S1-S2 is normal regular.  No S3-S4.  No rubs murmurs or bruit ?GI: Abdomen is soft.  Nontender nondistended.  Bowel sounds are present normal.  No masses organomegaly ?Extremities: Mild edema right lower extremity.  He is status post amputation left lower extremity.  No erythema or warmth noted over the right foot.  Pulses are poorly palpable due to edema. ?Neurologic: Alert and oriented x3.  No focal neurological deficits.  ? ? ?Lab Results: ? ?Data Reviewed: I have personally reviewed labs and imaging study reports ? ?CBC: ?Recent Labs  ?Lab 08/05/21 ?1153 08/05/21 ?1224 08/05/21 ?2010 08/06/21 ?0352 08/06/21 ?0935  ?WBC 4.7  --   --  5.1 5.7  ?NEUTROABS 2.5  --   --   --  3.5  ?HGB 4.6* 5.4* 5.3* 6.3* 7.2*  ?HCT 14.8* 16.0* 17.3* 19.0* 22.4*  ?MCV 119.4*  --   --  104.4* 100.4*  ?PLT 133*  --   --  136* 129*  ? ? ?Basic Metabolic Panel: ?Recent Labs  ?Lab 08/05/21 ?1153 08/05/21 ?1224 08/06/21 ?0352  ?NA 136 137 134*  ?K 4.2 4.3 4.7  ?CL 98 97* 95*  ?CO2 26  --  26  ?GLUCOSE 105* 102* 80  ?BUN 47* 50* 51*  ?CREATININE 8.58* 9.80* 9.05*  ?CALCIUM 8.6*  --  8.5*  ? ? ?GFR: ?Estimated Creatinine Clearance: 11.2 mL/min (A) (by C-G formula based on SCr of 9.05 mg/dL (H)). ? ?Liver Function Tests: ?Recent Labs  ?Lab 08/05/21 ?1153  ?AST 69*  ?ALT 26  ?ALKPHOS 55  ?BILITOT 0.7  ?PROT 6.9  ?ALBUMIN 2.5*  ? ? ? ?Radiology Studies: ?DG Foot 2 Views Right ? ?Result Date:  08/06/2021 ?CLINICAL DATA:  Chronic right foot pain for more than 6 months. No known injury. EXAM: RIGHT FOOT - 2 VIEW COMPARISON:  None. FINDINGS: There is no evidence of fracture or dislocation. Plantar calcaneal spurring. There is no evidence of significant arthropathy or other focal bone abnormality. Prominent vascular calcifications. Soft tissue swelling about the dorsum of the foot. IMPRESSION: 1.  No acute osseous abnormality. 2.  Plantar calcaneal spurring. 3. Prominent vascular calcifications and soft tissue swelling about the dorsum of the foot. Electronically Signed   By: Keane Police D.O.   On: 08/06/2021 12:33  ? ?CT Angio Abd/Pel w/ and/or w/o ? ?Result Date: 08/05/2021 ?CLINICAL DATA:  GI bleed, melena, history of duodenal ulcer, anemia and end-stage renal disease. EXAM: CT ANGIOGRAPHY ABDOMEN AND PELVIS WITH CONTRAST AND WITHOUT CONTRAST TECHNIQUE: Multidetector CT imaging of the abdomen and pelvis was performed using the standard protocol during bolus administration of intravenous contrast. Multiplanar reconstructed images and MIPs were obtained and reviewed to evaluate the vascular anatomy. RADIATION DOSE REDUCTION: This exam was performed according to the departmental dose-optimization program which includes automated exposure control, adjustment of the mA and/or kV according to patient size and/or use of iterative reconstruction technique. CONTRAST:  132m OMNIPAQUE IOHEXOL 350 MG/ML SOLN COMPARISON:  CTA of the abdomen with bilateral runoff on 07/03/2021 FINDINGS: VASCULAR Aorta: Atherosclerosis of the abdominal aorta without evidence of aneurysm or stenosis. No dissection identified Celiac: Atherosclerosis at origin of celiac axis without significant stenosis. Distal branch vessels demonstrate normal patency and normal branching anatomy. SMA: Mild atherosclerosis at the origin of the SMA and within the SMA trunk. No significant stenosis identified. Renals: Bilateral single renal arteries  demonstrate no significant stenosis. IMA: Normally patent. Inflow: Atherosclerosis of bilateral common, internal and external iliac arteries without significant obstructive disease or aneurysmal disease. Proximal Outfl

## 2021-08-06 NOTE — Assessment & Plan Note (Addendum)
Right SFA stenting on March 13th.   ?Per vascular surgery it is okay to stop the Plavix since it has been a month.  Remains on aspirin. ?

## 2021-08-06 NOTE — Progress Notes (Signed)
Critical Hemoglobin 6.3. Olena Heckle, NP aware. ?No signs of active bleeding, no signs of distress. ? ?

## 2021-08-06 NOTE — Transfer of Care (Signed)
Immediate Anesthesia Transfer of Care Note ? ?Patient: Daniel [name] ? ?Procedure(s) Performed: ENTEROSCOPY ?BIOPSY ? ?Patient Location: PACU ? ?Anesthesia Type:MAC ? ?Level of Consciousness: drowsy and patient cooperative ? ?Airway & Oxygen Therapy: Patient Spontanous Breathing and Patient connected to nasal cannula oxygen ? ?Post-op Assessment: Report given to RN and Post -op Vital signs reviewed and stable ? ?Post vital signs: Reviewed and stable ? ?Last Vitals:  ?Vitals Value Taken Time  ?BP 150/59 08/06/21 1154  ?Temp    ?Pulse 77 08/06/21 1154  ?Resp 11 08/06/21 1154  ?SpO2 97 % 08/06/21 1154  ?Vitals shown include unvalidated device data. ? ?Last Pain:  ?Vitals:  ? 08/06/21 0956  ?TempSrc:   ?PainSc: 0-No pain  ?   ? ?  ? ?Complications: No notable events documented. ?

## 2021-08-06 NOTE — Assessment & Plan Note (Addendum)
Patient was found to have a lung lesion for which he was seen by pulmonology.  Underwent PET scan in March which showed lesion in the liver for which MRI was recommended.   ?MRI ordered in the hospital by gastroenterology.  It raises concern for hepatocellular carcinoma.  Alpha-fetoprotein level is 13.9 (reference ranges 0-8.4).  ?Patient already followed by Dr. Julien Nordmann for concern for lung cancer.  ?Seen by [name].  She will arrange outpatient follow-up to discuss treatment options. ?

## 2021-08-06 NOTE — Hospital Course (Addendum)
61 y.o. male with medical history significant of recurrent GI bleed with history of multiple AV malformations, chronic iron deficiency anemia, diverticulosis, ESRD on HD, PAD status post left BKA, HTN, IIDM, came with worsening of black tarry stool and low hemoglobin counts. Patient was recently hospitalized for GI bleed in March.  EGD showed nonbleeding duodenal ulcer and colonic diverticulosis.  Presented with a hemoglobin of 4.6.  Started on blood transfusion.  Hospitalized for further management.  Underwent enteroscopy on 4/11.  Required blood transfusion during this hospitalization. ?

## 2021-08-07 ENCOUNTER — Encounter (HOSPITAL_COMMUNITY): Payer: Self-pay | Admitting: Gastroenterology

## 2021-08-07 ENCOUNTER — Inpatient Hospital Stay (HOSPITAL_COMMUNITY): Payer: Medicare Other

## 2021-08-07 DIAGNOSIS — Z992 Dependence on renal dialysis: Secondary | ICD-10-CM | POA: Diagnosis not present

## 2021-08-07 DIAGNOSIS — N186 End stage renal disease: Secondary | ICD-10-CM | POA: Diagnosis not present

## 2021-08-07 DIAGNOSIS — M79671 Pain in right foot: Secondary | ICD-10-CM

## 2021-08-07 DIAGNOSIS — D62 Acute posthemorrhagic anemia: Secondary | ICD-10-CM | POA: Diagnosis not present

## 2021-08-07 DIAGNOSIS — K317 Polyp of stomach and duodenum: Secondary | ICD-10-CM | POA: Diagnosis not present

## 2021-08-07 DIAGNOSIS — K921 Melena: Secondary | ICD-10-CM | POA: Diagnosis not present

## 2021-08-07 DIAGNOSIS — K922 Gastrointestinal hemorrhage, unspecified: Secondary | ICD-10-CM | POA: Diagnosis not present

## 2021-08-07 LAB — CBC
HCT: 21.7 % — ABNORMAL LOW (ref 39.0–52.0)
Hemoglobin: 7.1 g/dL — ABNORMAL LOW (ref 13.0–17.0)
MCH: 33 pg (ref 26.0–34.0)
MCHC: 32.7 g/dL (ref 30.0–36.0)
MCV: 100.9 fL — ABNORMAL HIGH (ref 80.0–100.0)
Platelets: 126 10*3/uL — ABNORMAL LOW (ref 150–400)
RBC: 2.15 MIL/uL — ABNORMAL LOW (ref 4.22–5.81)
RDW: 26 % — ABNORMAL HIGH (ref 11.5–15.5)
WBC: 4.8 10*3/uL (ref 4.0–10.5)
nRBC: 0.4 % — ABNORMAL HIGH (ref 0.0–0.2)

## 2021-08-07 LAB — BASIC METABOLIC PANEL
Anion gap: 7 (ref 5–15)
BUN: 23 mg/dL (ref 8–23)
CO2: 29 mmol/L (ref 22–32)
Calcium: 8.3 mg/dL — ABNORMAL LOW (ref 8.9–10.3)
Chloride: 99 mmol/L (ref 98–111)
Creatinine, Ser: 5.09 mg/dL — ABNORMAL HIGH (ref 0.61–1.24)
GFR, Estimated: 12 mL/min — ABNORMAL LOW (ref >60)
Glucose, Bld: 116 mg/dL — ABNORMAL HIGH (ref 70–99)
Potassium: 3.8 mmol/L (ref 3.5–5.1)
Sodium: 135 mmol/L (ref 135–145)

## 2021-08-07 LAB — SURGICAL PATHOLOGY

## 2021-08-07 LAB — HEPATIC FUNCTION PANEL
ALT: 32 U/L (ref 0–44)
AST: 89 U/L — ABNORMAL HIGH (ref 15–41)
Albumin: 2.4 g/dL — ABNORMAL LOW (ref 3.5–5.0)
Alkaline Phosphatase: 63 U/L (ref 38–126)
Bilirubin, Direct: 0.2 mg/dL (ref 0.0–0.2)
Indirect Bilirubin: 0.4 mg/dL (ref 0.3–0.9)
Total Bilirubin: 0.6 mg/dL (ref 0.3–1.2)
Total Protein: 6.8 g/dL (ref 6.5–8.1)

## 2021-08-07 LAB — HEPATITIS B SURFACE ANTIGEN: Hepatitis B Surface Ag: NONREACTIVE

## 2021-08-07 LAB — HEPATITIS B SURFACE ANTIBODY, QUANTITATIVE: Hep B S AB Quant (Post): 39.1 m[IU]/mL (ref >9.9)

## 2021-08-07 MED ORDER — PANTOPRAZOLE SODIUM 40 MG PO TBEC
40.0000 mg | DELAYED_RELEASE_TABLET | Freq: Every day | ORAL | Status: DC
  Administered 2021-08-07 – 2021-08-10 (×4): 40 mg via ORAL
  Filled 2021-08-07 (×4): qty 1

## 2021-08-07 MED ORDER — GADOBUTROL 1 MMOL/ML IV SOLN
10.0000 mL | Freq: Once | INTRAVENOUS | Status: AC | PRN
  Administered 2021-08-07: 10 mL via INTRAVENOUS

## 2021-08-07 NOTE — Assessment & Plan Note (Addendum)
Concerned about his right foot pain.  Results of x-rays were given to him.  He was asked to follow-up with his PCP and perhaps consider referral to orthopedics.  Symptoms have been ongoing for about 4 months.  Calcaneal spurring noted on the imaging study could be contributing to his symptoms.  Does not give any history of gout.  No findings suggestive of an acute inflammation. ?

## 2021-08-07 NOTE — Progress Notes (Addendum)
? ? ? ?[facility] Progress Note ? ?CC:  Anemia, GI bleed  ? ?Subjective: No nausea or vomiting.  No abdominal pain.  No bowel movement since intraparietal small bowel enteroscopy yesterday.  No family at the bedside. ? ?Objective:  ? ?Small bowel enteroscopy  08/06/2021: ?- Mildly severe esophagitis with no bleeding. Biopsied. Given localized circumferential ?inflammation, suspect pill esophagitis. ?- Gastritis. ?- Friable gastric mucosa. Biopsied. ?- Multiple duodenal polyps. Biopsied. These are most likely reactive/hyperplastic, not ?adenomatous. These polyps were friable with contact oozing, and thus may be a source of ?GI bleeding. No AVMs were seen on this examination. ?- The examined portion of the jejunum was normal. ? ?Vital signs in last 24 hours: ?Temp:  [97.5 ?F (36.4 ?C)-98.6 ?F (37 ?C)] 98.6 ?F (14 ?C) (04/11 2059) ?Pulse Rate:  [76-91] 91 (04/11 2059) ?Resp:  [14-20] 15 (04/12 0500) ?BP: (109-186)/(38-90) 153/69 (04/11 2101) ?SpO2:  [94 %-99 %] 98 % (04/11 2059) ?Weight:  [118.8 SF-681 kg] 119.3 kg (04/11 1705) ?Last BM Date : 08/04/21 ? ?General:   Alert 61 year old male in no acute distress. ?Heart: Regular rate and rhythm, no murmurs ?Pulm: Breath sounds clear throughout ?Abdomen: Obese abdomen, soft.  Nontender.  No palpable mass.  Positive bowel sounds to all 4 quadrants. ?Extremities:  Without edema.  Left BKA. ?Neurologic:  Alert and  oriented x 4.  Speech is clear.  He answers questions to the best of his cognitive ability. ?Psych:  Alert and cooperative. Normal mood and affect. ? ?Intake/Output from previous day: ?04/11 0701 - 04/12 0700 ?In: 540 [P.O.:240; I.V.:300] ?Out: 3400 [Urine:400] ?Intake/Output this shift: ?Total I/O ?In: 240 [P.O.:240] ?Out: 400 [Urine:400] ? ?Lab Results: ?Recent Labs  ?  08/06/21 ?2751 08/06/21 ?1849 08/07/21 ?0320  ?WBC 5.7 4.7 4.8  ?HGB 7.2* 7.5* 7.1*  ?HCT 22.4* 22.2* 21.7*  ?PLT 129* 129* 126*  ? ?BMET ?Recent Labs  ?  08/05/21 ?1153  08/05/21 ?1224 08/06/21 ?0352 08/07/21 ?0320  ?NA 136 137 134* 135  ?K 4.2 4.3 4.7 3.8  ?CL 98 97* 95* 99  ?CO2 26  --  26 29  ?GLUCOSE 105* 102* 80 116*  ?BUN 47* 50* 51* 23  ?CREATININE 8.58* 9.80* 9.05* 5.09*  ?CALCIUM 8.6*  --  8.5* 8.3*  ? ?LFT ?Recent Labs  ?  08/05/21 ?1153  ?PROT 6.9  ?ALBUMIN 2.5*  ?AST 69*  ?ALT 26  ?ALKPHOS 55  ?BILITOT 0.7  ? ?PT/INR ?No results for input(s): LABPROT, INR in the last 72 hours. ?Hepatitis Panel ?Recent Labs  ?  08/05/21 ?1153  ?HEPBSAG NON REACTIVE  ? ? ?PET scan 07/23/2021: ?1. The ground-glass nodule in the RIGHT lower lobe is not well ?demonstrated on the nondiagnostic CT portion the PET-CT scan. No ?hypermetabolic lesion present on the PET data set within the RIGHT ?lower lobe. ?2. Bands of intensely hypermetabolic peripheral atelectasis in the ?medial RIGHT lower lobe and lateral LEFT lower lobe/lingula. Favor ?benign benign atelectasis or other inflammatory process. Recommend ?follow-up CT (6 - 8 weeks) to demonstrate resolution. If pattern ?persists or increased tissue sampling may be warranted. ?3. No metastatic adenopathy or distant metastatic disease. ?4. Incidental findings of large central non hypermetabolic lesion in ?the liver. Recommend MRI of the liver with contrast for further ?characterization. ?  ? ?DG Foot 2 Views Right ? ?Result Date: 08/06/2021 ?CLINICAL DATA:  Chronic right foot pain for more than 6 months. No known injury. EXAM: RIGHT FOOT - 2 VIEW COMPARISON:  None. FINDINGS: There is  no evidence of fracture or dislocation. Plantar calcaneal spurring. There is no evidence of significant arthropathy or other focal bone abnormality. Prominent vascular calcifications. Soft tissue swelling about the dorsum of the foot. IMPRESSION: 1.  No acute osseous abnormality. 2.  Plantar calcaneal spurring. 3. Prominent vascular calcifications and soft tissue swelling about the dorsum of the foot. Electronically Signed   By: Keane Police D.O.   On: 08/06/2021 12:33   ? ?CT Angio Abd/Pel w/ and/or w/o ? ?Result Date: 08/05/2021 ?CLINICAL DATA:  GI bleed, melena, history of duodenal ulcer, anemia and end-stage renal disease. EXAM: CT ANGIOGRAPHY ABDOMEN AND PELVIS WITH CONTRAST AND WITHOUT CONTRAST TECHNIQUE: Multidetector CT imaging of the abdomen and pelvis was performed using the standard protocol during bolus administration of intravenous contrast. Multiplanar reconstructed images and MIPs were obtained and reviewed to evaluate the vascular anatomy. RADIATION DOSE REDUCTION: This exam was performed according to the departmental dose-optimization program which includes automated exposure control, adjustment of the mA and/or kV according to patient size and/or use of iterative reconstruction technique. CONTRAST:  130m OMNIPAQUE IOHEXOL 350 MG/ML SOLN COMPARISON:  CTA of the abdomen with bilateral runoff on 07/03/2021 FINDINGS: VASCULAR Aorta: Atherosclerosis of the abdominal aorta without evidence of aneurysm or stenosis. No dissection identified Celiac: Atherosclerosis at origin of celiac axis without significant stenosis. Distal branch vessels demonstrate normal patency and normal branching anatomy. SMA: Mild atherosclerosis at the origin of the SMA and within the SMA trunk. No significant stenosis identified. Renals: Bilateral single renal arteries demonstrate no significant stenosis. IMA: Normally patent. Inflow: Atherosclerosis of bilateral common, internal and external iliac arteries without significant obstructive disease or aneurysmal disease. Proximal Outflow: Calcified bilateral common femoral arteries without significant stenosis. Femoral bifurcations demonstrate normal patency bilaterally. Veins: Venous phase imaging demonstrates normal patency of venous structures including the portal vein, splenic vein, visualized mesenteric veins, IVC, renal veins, iliac veins and common femoral veins. Review of the MIP images confirms the above findings. NON-VASCULAR Lower  chest: Bibasilar scarring and atelectasis and trace bilateral pleural effusions. Hepatobiliary: On the venous phase of imaging which represents a delayed phase, there is visualization of a rounded low-attenuation mass within the central liver measuring approximately 4.4 x 4.2 x 4.4 cm. This mass is near the juncture of segment IVa and VIII. There is also suggestion of an adjacent less well-defined lesion within segment VIII measuring roughly 2.2 cm. These lesions are not well visualized on the arterial phase. Main differential considerations are some type of well differentiated carcinoma such as hepatocellular carcinoma or neuroendocrine carcinoma. Metastatic disease or cholangiocarcinoma is not excluded. Further evaluation with MRI is warranted. The gallbladder is contracted. Single stone present in the neck of the gallbladder. No biliary ductal dilatation identified. There are a few scattered calcified granulomata in the liver. Pancreas: Unremarkable. No pancreatic ductal dilatation or surrounding inflammatory changes. Spleen: Normal in size without focal abnormality. Adrenals/Urinary Tract: No adrenal masses. The kidneys are atrophic bilaterally consistent with known end-stage renal disease. Nonobstructing tiny calculus in the lower pole of the left kidney. No evidence of hydronephrosis. The bladder is unremarkable. Stomach/Bowel: There is suggestion of irregularity along the medial aspect of the proximal duodenum near the expected position of the duodenal bulb or post bulbar duodenum. This is highly suspicious for focal ulcer. Some associated stranding in the adjacent fat abuts the pancreatic head and is consistent with edema. No extraluminal air or focal abscess is identified. No evidence of bowel obstruction, ileus or free intraperitoneal air. Mild diverticulosis of  the sigmoid colon. During arterial and venous phases of imaging, there is no extravasation of intravascular contrast into the gastrointestinal  tract. Lymphatic: No enlarged lymph nodes identified. Reproductive: Prostate is unremarkable. Other: No ascites.  No abdominal wall hernia. Musculoskeletal: No acute or significant osseous findings. IMPRESSION: 1. No ac

## 2021-08-07 NOTE — Progress Notes (Addendum)
? ?[facility] ?PROGRESS NOTE ? ? ?Daniel [name] BMW:[id number] DOB: 1960-12-22 DOA: 08/05/2021  2 ?DOS: the patient was seen and examined on 08/07/2021 ? ?PCP: [name], NP ? ?Brief History and Hospital Course:  ?61 y.o. male with medical history significant of recurrent GI bleed with history of multiple AV malformations, chronic iron deficiency anemia, diverticulosis, ESRD on HD, PAD status post left BKA, HTN, IIDM, came with worsening of black tarry stool and low hemoglobin counts. Patient was recently hospitalized for GI bleed in March.  EGD showed nonbleeding duodenal ulcer and colonic diverticulosis.  Presented with a hemoglobin of 4.6.  Started on blood transfusion.  Hospitalized for further management.  Underwent push enteroscopy on 4/11. ? ?Consultants: Gastroenterology.  Nephrology ? ?Procedures: Push enteroscopy planned for today. ? ? ?Subjective: ?Feels well.  No chest pain shortness of breath.  Normal bowel movements since yesterday.  No abdominal pain nausea or vomiting.   ? ? ? ?Assessment/Plan: ? ? ?* Acute GI bleeding ?Patient with previous history of GI bleed secondary to AVMs.  Came in with few day history of melanotic stools.  Hemoglobin was 4.6 on admission.  Patient was hospitalized for further management. ?Gastroenterology was consulted.  Patient underwent small bowel enteroscopy.  Revealed esophagitis without active bleeding.  Biopsies were taken.  Gastritis was noted.  Multiple duodenal polyps were noted.  No overt bleeding was present. ?Has not had any further episodes of melena. ?Change PPI to once a day as per GI recommendations. ? ?Acute blood loss anemia ?Hemoglobin was 4.6 at presentation.  Status post multiple blood transfusions.  Noted to be greater than 7 this morning.  We will recheck later today and tomorrow.   ? ?ESRD (end stage renal disease) on dialysis [facility]) ?Dialyzed on Monday Wednesday Friday schedule.   ?Dialyzed yesterday.  Plan is to dialyze him again  today to get him back on schedule.  Nephrology is following and managing. ? ?Right foot pain ?Concerned about his right foot pain.  Results of x-rays were given to him.  Positive follow-up with his PCP and perhaps consider referral to orthopedics.  Symptoms have been ongoing for about 4 months.  Calcaneal spurring noted on the imaging study could be contributing to his symptoms.  Does not give any history of gout.  No findings suggestive of an acute inflammation. ? ?Hepatic lesion ?Patient was found to have a lung lesion for which he was seen by pulmonology.  Underwent PET scan in March which showed lesion in the liver for which MRI was recommended.  Patient was seen by Dr. Valeta Harms recently who has ordered outpatient MRI. ? ?Lung mass ?Followed by Dr. Valeta Harms for same. ? ?PAD (peripheral artery disease) ([place]) ?Right SFA stenting on March 13th.  Per vascular surgery it is okay to stop the Plavix since it has been a month.  Remains on aspirin. ? ? ?Obesity ?Estimated body mass index is 36.68 kg/m? as calculated from the following: ?  Height as of this encounter: '5\' 11"'$  (1.803 m). ?  Weight as of this encounter: 119.3 kg. ? ? ?DVT Prophylaxis: SCDs ?Code Status: Full code ?Family Communication: Discussed with patient ?Disposition Plan: Anticipate discharge tomorrow. ? ?Status is: Inpatient ?Remains inpatient appropriate because: GI bleed, severe anemia ? ? ? ? ?Medications: Scheduled: ? aspirin EC  81 mg Oral Daily  ? atorvastatin  40 mg Oral Daily  ? calcium acetate  667 mg Oral TID WC  ? Chlorhexidine Gluconate Cloth  6 each Topical Q0600  ?  Chlorhexidine Gluconate Cloth  6 each Topical Q0600  ? ferrous sulfate  325 mg Oral Q breakfast  ? gabapentin  100 mg Oral TID  ? lidocaine  1 application. Topical Q M,W,F  ? saccharomyces boulardii  250 mg Oral BID  ? ?Continuous: ? pantoprazole 8 mg/hr (08/07/21 0218)  ? ?EVO:[id number], HYDROcodone-acetaminophen, hydrOXYzine, loperamide, ondansetron **OR** ondansetron (ZOFRAN)  IV ? ?Antibiotics: ?Anti-infectives (From admission, onward)  ? ? None  ? ?  ? ? ?Objective: ? ?Vital Signs ? ?Vitals:  ? 08/07/21 0500 08/07/21 0600 08/07/21 0649 08/07/21 0750  ?BP:   (!) 143/93 (!) 174/79  ?Pulse:   90 90  ?Resp: '15  18 12  '$ ?Temp:  98.2 ?F (36.8 ?C) 98.2 ?F (36.8 ?C) 98.5 ?F (36.9 ?C)  ?TempSrc:  Oral Oral Oral  ?SpO2:   100% 100%  ?Weight:      ?Height:      ? ? ?Intake/Output Summary (Last 24 hours) at 08/07/2021 1000 ?Last data filed at 08/07/2021 0500 ?Gross per 24 hour  ?Intake 540 ml  ?Output 3400 ml  ?Net -2860 ml  ? ?Filed Weights  ? 08/06/21 1249 08/06/21 1250 08/06/21 1705  ?Weight: 122 kg 122 kg 119.3 kg  ? ? ?General appearance: Awake alert.  In no distress ?Resp: Clear to auscultation bilaterally.  Normal effort ?Cardio: S1-S2 is normal regular.  No S3-S4.  No rubs murmurs or bruit ?GI: Abdomen is soft.  Nontender nondistended.  Bowel sounds are present normal.  No masses organomegaly ?Extremities: Status post amputation on the left.  Right foot reexamined.  No erythema noted.  Mild swelling is appreciated.  No warmth. ?Neurologic: Alert and oriented x3.  No focal neurological deficits.  ? ? ? ?Lab Results: ? ?Data Reviewed: I have personally reviewed labs and imaging study reports ? ?CBC: ?Recent Labs  ?Lab 08/05/21 ?1153 08/05/21 ?1224 08/05/21 ?2010 08/06/21 ?0352 08/06/21 ?9371 08/06/21 ?1849 08/07/21 ?0320  ?WBC 4.7  --   --  5.1 5.7 4.7 4.8  ?NEUTROABS 2.5  --   --   --  3.5  --   --   ?HGB 4.6*   < > 5.3* 6.3* 7.2* 7.5* 7.1*  ?HCT 14.8*   < > 17.3* 19.0* 22.4* 22.2* 21.7*  ?MCV 119.4*  --   --  104.4* 100.4* 100.0 100.9*  ?PLT 133*  --   --  136* 129* 129* 126*  ? < > = values in this interval not displayed.  ? ? ?Basic Metabolic Panel: ?Recent Labs  ?Lab 08/05/21 ?1153 08/05/21 ?1224 08/06/21 ?0352 08/07/21 ?0320  ?NA 136 137 134* 135  ?K 4.2 4.3 4.7 3.8  ?CL 98 97* 95* 99  ?CO2 26  --  26 29  ?GLUCOSE 105* 102* 80 116*  ?BUN 47* 50* 51* 23  ?CREATININE 8.58* 9.80* 9.05* 5.09*   ?CALCIUM 8.6*  --  8.5* 8.3*  ? ? ?GFR: ?Estimated Creatinine Clearance: 20 mL/min (A) (by C-G formula based on SCr of 5.09 mg/dL (H)). ? ?Liver Function Tests: ?Recent Labs  ?Lab 08/05/21 ?1153  ?AST 69*  ?ALT 26  ?ALKPHOS 55  ?BILITOT 0.7  ?PROT 6.9  ?ALBUMIN 2.5*  ? ? ? ?Radiology Studies: ?DG Foot 2 Views Right ? ?Result Date: 08/06/2021 ?CLINICAL DATA:  Chronic right foot pain for more than 6 months. No known injury. EXAM: RIGHT FOOT - 2 VIEW COMPARISON:  None. FINDINGS: There is no evidence of fracture or dislocation. Plantar calcaneal spurring. There is no evidence of significant arthropathy  or other focal bone abnormality. Prominent vascular calcifications. Soft tissue swelling about the dorsum of the foot. IMPRESSION: 1.  No acute osseous abnormality. 2.  Plantar calcaneal spurring. 3. Prominent vascular calcifications and soft tissue swelling about the dorsum of the foot. Electronically Signed   By: Keane Police D.O.   On: 08/06/2021 12:33  ? ?CT Angio Abd/Pel w/ and/or w/o ? ?Result Date: 08/05/2021 ?CLINICAL DATA:  GI bleed, melena, history of duodenal ulcer, anemia and end-stage renal disease. EXAM: CT ANGIOGRAPHY ABDOMEN AND PELVIS WITH CONTRAST AND WITHOUT CONTRAST TECHNIQUE: Multidetector CT imaging of the abdomen and pelvis was performed using the standard protocol during bolus administration of intravenous contrast. Multiplanar reconstructed images and MIPs were obtained and reviewed to evaluate the vascular anatomy. RADIATION DOSE REDUCTION: This exam was performed according to the departmental dose-optimization program which includes automated exposure control, adjustment of the mA and/or kV according to patient size and/or use of iterative reconstruction technique. CONTRAST:  110m OMNIPAQUE IOHEXOL 350 MG/ML SOLN COMPARISON:  CTA of the abdomen with bilateral runoff on 07/03/2021 FINDINGS: VASCULAR Aorta: Atherosclerosis of the abdominal aorta without evidence of aneurysm or stenosis. No  dissection identified Celiac: Atherosclerosis at origin of celiac axis without significant stenosis. Distal branch vessels demonstrate normal patency and normal branching anatomy. SMA: Mild atherosclerosis at

## 2021-08-07 NOTE — Progress Notes (Addendum)
?[facility] ?Progress Note  ? ?Subjective:   Tolerated HD yesterday with 3L UF. Reports his shortness of breath is resolved. Denies CP, palpitations, dizziness, abdominal pain and nausea. Says now they want to do test on liver ?  ? ?Objective ?Vitals:  ? 08/07/21 0500 08/07/21 0600 08/07/21 0649 08/07/21 0750  ?BP:   (!) 143/93 (!) 174/79  ?Pulse:   90 90  ?Resp: '15  18 12  '$ ?Temp:  98.2 ?F (36.8 ?C) 98.2 ?F (36.8 ?C) 98.5 ?F (36.9 ?C)  ?TempSrc:  Oral Oral Oral  ?SpO2:   100% 100%  ?Weight:      ?Height:      ? ?Physical Exam ?General: WDWN male, alert and in NAD ?Heart: RRR, no murmurs, rubs or gallops ?Lungs: CTA bilaterally without wheezing, rhonchi or rales ?Abdomen: Soft, non-distended, +BS ?Extremities: trace edema RLE ?Dialysis Access:  LUE AVF +t/b ? ?Additional Objective ?Labs: ?Basic Metabolic Panel: ?Recent Labs  ?Lab 08/05/21 ?1153 08/05/21 ?1224 08/06/21 ?0352 08/07/21 ?0320  ?NA 136 137 134* 135  ?K 4.2 4.3 4.7 3.8  ?CL 98 97* 95* 99  ?CO2 26  --  26 29  ?GLUCOSE 105* 102* 80 116*  ?BUN 47* 50* 51* 23  ?CREATININE 8.58* 9.80* 9.05* 5.09*  ?CALCIUM 8.6*  --  8.5* 8.3*  ? ?Liver Function Tests: ?Recent Labs  ?Lab 08/05/21 ?1153  ?AST 69*  ?ALT 26  ?ALKPHOS 55  ?BILITOT 0.7  ?PROT 6.9  ?ALBUMIN 2.5*  ? ?No results for input(s): LIPASE, AMYLASE in the last 168 hours. ?CBC: ?Recent Labs  ?Lab 08/05/21 ?1153 08/05/21 ?1224 08/06/21 ?0352 08/06/21 ?2703 08/06/21 ?1849 08/07/21 ?0320  ?WBC 4.7  --  5.1 5.7 4.7 4.8  ?NEUTROABS 2.5  --   --  3.5  --   --   ?HGB 4.6*   < > 6.3* 7.2* 7.5* 7.1*  ?HCT 14.8*   < > 19.0* 22.4* 22.2* 21.7*  ?MCV 119.4*  --  104.4* 100.4* 100.0 100.9*  ?PLT 133*  --  136* 129* 129* 126*  ? < > = values in this interval not displayed.  ? ?Blood Culture ?   ?Component Value Date/Time  ? SDES BLOOD RIGHT HAND 04/14/2021 1035  ? SPECREQUEST  04/14/2021 1035  ?  BOTTLES DRAWN AEROBIC AND ANAEROBIC Blood Culture adequate volume  ? CULT  04/14/2021 1035  ?  NO GROWTH 5  DAYS ?Performed at [facility], [place] [address] [place], [place] [address] ?  ? REPTSTATUS 04/19/2021 FINAL 04/14/2021 1035  ? ? ?Cardiac Enzymes: ?No results for input(s): CKTOTAL, CKMB, CKMBINDEX, TROPONINI in the last 168 hours. ?CBG: ?Recent Labs  ?Lab 08/06/21 ?1157  ?GLUCAP 77  ? ?Iron Studies: No results for input(s): IRON, TIBC, TRANSFERRIN, FERRITIN in the last 72 hours. ?'@lablastinr3'$ @ ?Studies/Results: ?DG Foot 2 Views Right ? ?Result Date: 08/06/2021 ?CLINICAL DATA:  Chronic right foot pain for more than 6 months. No known injury. EXAM: RIGHT FOOT - 2 VIEW COMPARISON:  None. FINDINGS: There is no evidence of fracture or dislocation. Plantar calcaneal spurring. There is no evidence of significant arthropathy or other focal bone abnormality. Prominent vascular calcifications. Soft tissue swelling about the dorsum of the foot. IMPRESSION: 1.  No acute osseous abnormality. 2.  Plantar calcaneal spurring. 3. Prominent vascular calcifications and soft tissue swelling about the dorsum of the foot. Electronically Signed   By: Keane Police D.O.   On: 08/06/2021 12:33  ? ?CT Angio Abd/Pel w/ and/or w/o ? ?Result  Date: 08/05/2021 ?CLINICAL DATA:  GI bleed, melena, history of duodenal ulcer, anemia and end-stage renal disease. EXAM: CT ANGIOGRAPHY ABDOMEN AND PELVIS WITH CONTRAST AND WITHOUT CONTRAST TECHNIQUE: Multidetector CT imaging of the abdomen and pelvis was performed using the standard protocol during bolus administration of intravenous contrast. Multiplanar reconstructed images and MIPs were obtained and reviewed to evaluate the vascular anatomy. RADIATION DOSE REDUCTION: This exam was performed according to the departmental dose-optimization program which includes automated exposure control, adjustment of the mA and/or kV according to patient size and/or use of iterative reconstruction technique. CONTRAST:  147m OMNIPAQUE IOHEXOL 350 MG/ML SOLN COMPARISON:  CTA of the abdomen with bilateral runoff on  07/03/2021 FINDINGS: VASCULAR Aorta: Atherosclerosis of the abdominal aorta without evidence of aneurysm or stenosis. No dissection identified Celiac: Atherosclerosis at origin of celiac axis without significant stenosis. Distal branch vessels demonstrate normal patency and normal branching anatomy. SMA: Mild atherosclerosis at the origin of the SMA and within the SMA trunk. No significant stenosis identified. Renals: Bilateral single renal arteries demonstrate no significant stenosis. IMA: Normally patent. Inflow: Atherosclerosis of bilateral common, internal and external iliac arteries without significant obstructive disease or aneurysmal disease. Proximal Outflow: Calcified bilateral common femoral arteries without significant stenosis. Femoral bifurcations demonstrate normal patency bilaterally. Veins: Venous phase imaging demonstrates normal patency of venous structures including the portal vein, splenic vein, visualized mesenteric veins, IVC, renal veins, iliac veins and common femoral veins. Review of the MIP images confirms the above findings. NON-VASCULAR Lower chest: Bibasilar scarring and atelectasis and trace bilateral pleural effusions. Hepatobiliary: On the venous phase of imaging which represents a delayed phase, there is visualization of a rounded low-attenuation mass within the central liver measuring approximately 4.4 x 4.2 x 4.4 cm. This mass is near the juncture of segment IVa and VIII. There is also suggestion of an adjacent less well-defined lesion within segment VIII measuring roughly 2.2 cm. These lesions are not well visualized on the arterial phase. Main differential considerations are some type of well differentiated carcinoma such as hepatocellular carcinoma or neuroendocrine carcinoma. Metastatic disease or cholangiocarcinoma is not excluded. Further evaluation with MRI is warranted. The gallbladder is contracted. Single stone present in the neck of the gallbladder. No biliary ductal  dilatation identified. There are a few scattered calcified granulomata in the liver. Pancreas: Unremarkable. No pancreatic ductal dilatation or surrounding inflammatory changes. Spleen: Normal in size without focal abnormality. Adrenals/Urinary Tract: No adrenal masses. The kidneys are atrophic bilaterally consistent with known end-stage renal disease. Nonobstructing tiny calculus in the lower pole of the left kidney. No evidence of hydronephrosis. The bladder is unremarkable. Stomach/Bowel: There is suggestion of irregularity along the medial aspect of the proximal duodenum near the expected position of the duodenal bulb or post bulbar duodenum. This is highly suspicious for focal ulcer. Some associated stranding in the adjacent fat abuts the pancreatic head and is consistent with edema. No extraluminal air or focal abscess is identified. No evidence of bowel obstruction, ileus or free intraperitoneal air. Mild diverticulosis of the sigmoid colon. During arterial and venous phases of imaging, there is no extravasation of intravascular contrast into the gastrointestinal tract. Lymphatic: No enlarged lymph nodes identified. Reproductive: Prostate is unremarkable. Other: No ascites.  No abdominal wall hernia. Musculoskeletal: No acute or significant osseous findings. IMPRESSION: 1. No active bleeding is identified into the gastrointestinal tract during arterial and venous phases of CT imaging. 2. Irregular outpouching along the medial aspect of the proximal duodenum near the expected position of the duodenal  bulb or post bulbar duodenum. This is highly suspicious for a focal duodenal ulcer. There is some adjacent edema in the retroperitoneal fat. This may be the source of GI bleeding clinically. 3. Detection of a rounded 4.4 cm central hepatic mass near the juncture of segments IVb and VIII as well as a possible adjacent 2.2 cm mass in segment VIII. Main differential considerations are some type of well differentiated  carcinoma such as hepatocellular carcinoma or neuroendocrine carcinoma. Metastatic disease or cholangiocarcinoma is not excluded. Further evaluation with MRI of the abdomen with and without contrast is warrant

## 2021-08-07 NOTE — Progress Notes (Signed)
Messaged pharmacy about pt not coming back up in the pyxis for this nurse to pull calcium and gabapentin. Pt was in Dialysis and then returned back to unit.  ?

## 2021-08-08 DIAGNOSIS — N186 End stage renal disease: Secondary | ICD-10-CM | POA: Diagnosis not present

## 2021-08-08 DIAGNOSIS — D509 Iron deficiency anemia, unspecified: Secondary | ICD-10-CM

## 2021-08-08 DIAGNOSIS — C220 Liver cell carcinoma: Secondary | ICD-10-CM

## 2021-08-08 DIAGNOSIS — D500 Iron deficiency anemia secondary to blood loss (chronic): Secondary | ICD-10-CM | POA: Diagnosis not present

## 2021-08-08 DIAGNOSIS — Q2730 Arteriovenous malformation, site unspecified: Secondary | ICD-10-CM

## 2021-08-08 DIAGNOSIS — K769 Liver disease, unspecified: Secondary | ICD-10-CM

## 2021-08-08 DIAGNOSIS — B1920 Unspecified viral hepatitis C without hepatic coma: Secondary | ICD-10-CM

## 2021-08-08 DIAGNOSIS — Z992 Dependence on renal dialysis: Secondary | ICD-10-CM | POA: Diagnosis not present

## 2021-08-08 DIAGNOSIS — K7460 Unspecified cirrhosis of liver: Secondary | ICD-10-CM

## 2021-08-08 DIAGNOSIS — D62 Acute posthemorrhagic anemia: Secondary | ICD-10-CM | POA: Diagnosis not present

## 2021-08-08 DIAGNOSIS — I120 Hypertensive chronic kidney disease with stage 5 chronic kidney disease or end stage renal disease: Secondary | ICD-10-CM

## 2021-08-08 DIAGNOSIS — K922 Gastrointestinal hemorrhage, unspecified: Secondary | ICD-10-CM | POA: Diagnosis not present

## 2021-08-08 DIAGNOSIS — E1122 Type 2 diabetes mellitus with diabetic chronic kidney disease: Secondary | ICD-10-CM

## 2021-08-08 LAB — CBC
HCT: 21.9 % — ABNORMAL LOW (ref 39.0–52.0)
Hemoglobin: 6.9 g/dL — CL (ref 13.0–17.0)
MCH: 32.5 pg (ref 26.0–34.0)
MCHC: 31.5 g/dL (ref 30.0–36.0)
MCV: 103.3 fL — ABNORMAL HIGH (ref 80.0–100.0)
Platelets: 119 10*3/uL — ABNORMAL LOW (ref 150–400)
RBC: 2.12 MIL/uL — ABNORMAL LOW (ref 4.22–5.81)
RDW: 24.5 % — ABNORMAL HIGH (ref 11.5–15.5)
WBC: 5 10*3/uL (ref 4.0–10.5)
nRBC: 0 % (ref 0.0–0.2)

## 2021-08-08 LAB — PREPARE RBC (CROSSMATCH)

## 2021-08-08 LAB — ANA: Anti Nuclear Antibody (ANA): NEGATIVE

## 2021-08-08 LAB — IGG: IgG (Immunoglobin G), Serum: 2034 mg/dL — ABNORMAL HIGH (ref 603–1613)

## 2021-08-08 LAB — HEMOGLOBIN AND HEMATOCRIT, BLOOD
HCT: 24.3 % — ABNORMAL LOW (ref 39.0–52.0)
Hemoglobin: 7.7 g/dL — ABNORMAL LOW (ref 13.0–17.0)

## 2021-08-08 LAB — HCV RNA QUANT
HCV Quantitative Log: 6.995 log10 IU/mL (ref >1.70)
HCV Quantitative: 9880000 IU/mL (ref >50)

## 2021-08-08 LAB — AFP TUMOR MARKER: AFP, Serum, Tumor Marker: 13.9 ng/mL — ABNORMAL HIGH (ref 0.0–8.4)

## 2021-08-08 LAB — CERULOPLASMIN: Ceruloplasmin: 16.1 mg/dL (ref 16.0–31.0)

## 2021-08-08 MED ORDER — SODIUM CHLORIDE 0.9% IV SOLUTION: Freq: Once | INTRAVENOUS | Status: AC

## 2021-08-08 MED ORDER — CHLORHEXIDINE GLUCONATE CLOTH 2 % EX PADS
6.0000 | MEDICATED_PAD | Freq: Every day | CUTANEOUS | Status: DC
  Administered 2021-08-08 – 2021-08-10 (×3): 6 via TOPICAL

## 2021-08-08 NOTE — Plan of Care (Signed)
  Problem: Safety: Goal: Ability to remain free from injury will improve Outcome: Progressing   Problem: Pain Managment: Goal: General experience of comfort will improve Outcome: Progressing   

## 2021-08-08 NOTE — TOC Initial Note (Signed)
Transition of Care (TOC) - Initial/Assessment Note  ? ? ?Patient Details  ?Name: Daniel [name] ?MRN: [id number] ?Date of Birth: 1961/03/01 ? ?Transition of Care [facility]) CM/SW Contact:    ?Daniel Chars, LCSW ?Phone Number: ?08/08/2021, 2:21 PM ? ?Clinical Narrative:      ? ?Pt admitted from [facility], [place] spoke with [facility] who reports pt had completed rehab there and was scheduled for DC home prior to being admitted to [facility]. (Per Epic, pt DC to [facility] on 07/10/21)    ? ?CSW met with pt for initial assessment. Pt confirmed the above, reports that he lives in a boarding house and is planning to return there, although may be going to a different boarding house.  Discussed pending PT eval, discussed possible recommendations for SNF or HH, discussed copay days.  Pt reports he does not have finances for copay days, believes he had made enough progress to go home.  Permission given to speak with brother Daniel [name].  Pt has been vaccinated for covid but not boosted.              ? ? ?Expected Discharge Plan:  (TBD) ?Barriers to Discharge: Continued Medical Work up, Other (must enter comment) (pending PT eval) ? ? ?Patient Goals and CMS Choice ?Patient states their goals for this hospitalization and ongoing recovery are:: be independent ?  ?  ? ?Expected Discharge Plan and Services ?Expected Discharge Plan:  (TBD) ?In-house Referral: Clinical Social Work ?  ?Post Acute Care Choice:  (TBD) ?Living arrangements for the past 2 months: [place] ?                ?  ?  ?  ?  ?  ?  ?  ?  ?  ?  ? ?Prior Living Arrangements/Services ?Living arrangements for the past 2 months: [place] ?Lives with:: Self ?Patient language and need for interpreter reviewed:: Yes ?Do you feel safe going back to the place where you live?: Yes      ?Need for Family Participation in Patient Care: No (Comment) ?Care giver support system in place?: Yes (comment) ?Current home services: Other (comment)  (none) ?Criminal Activity/Legal Involvement Pertinent to Current Situation/Hospitalization: No - Comment as needed ? ?Activities of Daily Living ?Home Assistive Devices/Equipment: Wheelchair ?ADL Screening (condition at time of admission) ?Patient's cognitive ability adequate to safely complete daily activities?: Yes ?Is the patient deaf or have difficulty hearing?: No ?Does the patient have difficulty seeing, even when wearing glasses/contacts?: No ?Does the patient have difficulty concentrating, remembering, or making decisions?: No ?Patient able to express need for assistance with ADLs?: Yes ?Does the patient have difficulty dressing or bathing?: Yes ?Independently performs ADLs?: No ?Communication: Independent ?Dressing (OT): Needs assistance ?Is this a change from baseline?: Pre-admission baseline ?Grooming: Needs assistance ?Is this a change from baseline?: Pre-admission baseline ?Feeding: Independent ?Bathing: Needs assistance ?Is this a change from baseline?: Pre-admission baseline ?Toileting: Needs assistance ?Is this a change from baseline?: Pre-admission baseline ?In/Out Bed: Needs assistance ?Is this a change from baseline?: Pre-admission baseline ?Walks in Home: Dependent ?Is this a change from baseline?: Pre-admission baseline ?Does the patient have difficulty walking or climbing stairs?: Yes ?Weakness of Legs: Both ?Weakness of Arms/Hands: None ? ?Permission Sought/Granted ?Permission sought to share information with : Family Supports ?Permission granted to share information with : Yes, Verbal Permission Granted ? Share Information with NAME: brother Daniel [name] ?   ?   ?   ? ?Emotional  Assessment ?Appearance:: Appears stated age ?Attitude/Demeanor/Rapport: Engaged ?Affect (typically observed): Appropriate, Pleasant ?Orientation: : Oriented to Self, Oriented to Place, Oriented to  Time, Oriented to Situation ?Alcohol / Substance Use: Not Applicable ?Psych Involvement: No (comment) ? ?Admission diagnosis:   Melena [K92.1] ?GI bleed [K92.2] ?ESRD (end stage renal disease) on dialysis ([place]) [N18.6, Z99.2] ?Gastrointestinal hemorrhage, unspecified gastrointestinal hemorrhage type [K92.2] ?Patient Active Problem List  ? Diagnosis Date Noted  ? Right foot pain 08/07/2021  ? Lung mass 08/06/2021  ? Hepatic lesion 08/06/2021  ? Melena   ? Pressure injury of skin 07/05/2021  ? Right leg pain 07/04/2021  ? GI bleeding 07/04/2021  ? Acute lower GI bleeding 07/03/2021  ? Acute on chronic diastolic CHF (congestive heart failure) ([place]) 06/14/2021  ? GERD (gastroesophageal reflux disease) 06/14/2021  ? Chest pain 06/13/2021  ? Diabetic foot infection ([place]) 04/14/2021  ? PAD (peripheral artery disease) ([place]) 04/08/2021  ? Gastritis and gastroduodenitis   ? ESRD (end stage renal disease) on dialysis ([place]) 03/25/2021  ? Diabetes mellitus type 2, controlled, with complications ([place]) 73/71/0626  ? QT prolongation 03/25/2021  ? Alcohol abuse with intoxication ([place]) 03/25/2021  ? Cocaine abuse ([place]) 03/25/2021  ? Tobacco use 03/25/2021  ? Class 2 obesity due to excess calories with body mass index (BMI) of 39.0 to 39.9 in adult 03/25/2021  ? AVM (arteriovenous malformation) of duodenum, acquired   ? Peptic ulcer disease   ? Upper GI bleed 03/24/2021  ? Chalazion of right upper eyelid 03/24/2021  ? Thrombocytopenia ([place]) 03/24/2021  ? Visual hallucination 03/24/2021  ? Bilateral leg edema 03/24/2021  ? Acute blood loss anemia   ? Benign neoplasm of duodenum, jejunum, and ileum   ? Anemia due to chronic kidney disease 02/23/2021  ? Prolonged QT interval 02/23/2021  ? Rotator cuff tear arthropathy of right shoulder 01/23/2021  ? Alcohol dependence ([place]) 01/13/2021  ? Gastric ulcer with hemorrhage 01/13/2021  ? Generalized weakness   ? Transaminitis 01/10/2021  ? History of alcohol abuse 10/27/2020  ? History of cocaine abuse ([place]) 10/27/2020  ? Nicotine dependence, cigarettes, uncomplicated 94/85/4627  ? Acute GI bleeding 08/10/2020  ?  Duodenal ulcer with hemorrhage   ? 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 AVM (arteriovenous malformation) of small bowel, acquired with hemorrhage   ? Symptomatic anemia 07/31/2019  ? Chronic hepatitis C without hepatic coma ([place]) 07/11/2019  ? Iron deficiency anemia 03/30/2019  ? Alcohol use disorder 03/30/2019  ? B12 deficiency 03/30/2019  ? Orthostatic hypotension 01/22/2019  ? Essential hypertension 06/29/2016  ? DM2 (diabetes mellitus, type 2) ([place]) 06/29/2016  ? ?PCP:  [name], NP ?Pharmacy:   ?[facility] [place] ?[address] [place] ?[place] [address] ?Phone: [phone] Fax: [phone] ? ? ? ? ?Social Determinants of Health (SDOH) Interventions ?  ? ?Readmission Risk Interventions ? ?  02/27/2021  ? 12:51 PM 07/07/2019  ?  4:03 PM  ?Readmission Risk Prevention Plan  ?Transportation Screening Complete Complete  ?PCP or Specialist Appt within 3-5 Days  Complete  ?[place] or Home Care Consult  Complete  ?Social Work Consult for [place] Planning/Counseling  Complete  ?Palliative Care Screening  Not Applicable  ?Medication Review Press photographer) Complete Complete  ?PCP or Specialist appointment within 3-5 days of discharge Complete   ?[place] or Home Care Consult Complete   ?SW Recovery Care/Counseling Consult Complete   ?Palliative Care Screening Not Applicable   ?Skilled  Nursing Facility Not Applicable   ? ? ? ?

## 2021-08-08 NOTE — Care Management Important Message (Signed)
Important Message ? ?Patient Details  ?Name: Daniel [name] ?MRN: [id number] ?Date of Birth: 26-Apr-1961 ? ? ?Medicare Important Message Given:  Yes ? ? ? ? ?Karlie Aung ?08/08/2021, 3:10 PM ?

## 2021-08-08 NOTE — Progress Notes (Addendum)
? ? ? ?[facility] Progress Note ? ?CC:   Anemia, GI bleed  ? ?Subjective: He is tearful, continues to have leg pain, overwhelmed with news regarding liver lesion likely HCC. He passed a BM last night, he didn't look at it. Stool color not documented in chart.  Decreased appetite.  ? ?Objective:  ?Vital signs in last 24 hours: ?Temp:  [97.5 ?F (36.4 ?C)-98.6 ?F (37 ?C)] 98.1 ?F (36.7 ?C) (04/13 1004) ?Pulse Rate:  [81-96] 87 (04/13 1004) ?Resp:  [11-18] 16 (04/13 1004) ?BP: (117-213)/(26-89) 146/57 (04/13 1004) ?SpO2:  [95 %-100 %] 100 % (04/13 1004) ?Weight:  [794 kg-117.2 kg] 115 kg (04/12 1748) ?Last BM Date : 08/04/21 ?General:  Alert 61 year old male sitting at the bedside.  ?Heart: Regular rate and rhythm, no murmurs ?Pulm:  Breath sounds clear throughout ?Abdomen: Obese abdomen, soft.  Nontender.  No palpable mass.  Positive bowel sounds to all 4 quadrants ?Extremities:  RLE with mild edema.  Left BKA. ?Neurologic:  Alert and  oriented x 4. Grossly normal neurologically. ?Psych:  Alert and cooperative. Normal mood and affect. ? ?Intake/Output from previous day: ?04/12 0701 - 04/13 0700 ?In: -  ?Out: 2000  ?Intake/Output this shift: ?Total I/O ?In: 680 [P.O.:50; Blood:630] ?Out: 180 [Urine:180] ? ?Lab Results: ?Recent Labs  ?  08/06/21 ?1849 08/07/21 ?0320 08/08/21 ?0217  ?WBC 4.7 4.8 5.0  ?HGB 7.5* 7.1* 6.9*  ?HCT 22.2* 21.7* 21.9*  ?PLT 129* 126* 119*  ? ?BMET ?Recent Labs  ?  08/05/21 ?1153 08/05/21 ?1224 08/06/21 ?0352 08/07/21 ?0320  ?NA 136 137 134* 135  ?K 4.2 4.3 4.7 3.8  ?CL 98 97* 95* 99  ?CO2 26  --  26 29  ?GLUCOSE 105* 102* 80 116*  ?BUN 47* 50* 51* 23  ?CREATININE 8.58* 9.80* 9.05* 5.09*  ?CALCIUM 8.6*  --  8.5* 8.3*  ? ?LFT ?Recent Labs  ?  08/07/21 ?1443  ?PROT 6.8  ?ALBUMIN 2.4*  ?AST 89*  ?ALT 32  ?ALKPHOS 63  ?BILITOT 0.6  ?BILIDIR 0.2  ?IBILI 0.4  ? ?PT/INR ?No results for input(s): LABPROT, INR in the last 72 hours. ?Hepatitis Panel ?Recent Labs  ?  08/07/21 ?1443  ?HEPBSAG  NON REACTIVE  ? ? ?DG Foot 2 Views Right ? ?Result Date: 08/06/2021 ?CLINICAL DATA:  Chronic right foot pain for more than 6 months. No known injury. EXAM: RIGHT FOOT - 2 VIEW COMPARISON:  None. FINDINGS: There is no evidence of fracture or dislocation. Plantar calcaneal spurring. There is no evidence of significant arthropathy or other focal bone abnormality. Prominent vascular calcifications. Soft tissue swelling about the dorsum of the foot. IMPRESSION: 1.  No acute osseous abnormality. 2.  Plantar calcaneal spurring. 3. Prominent vascular calcifications and soft tissue swelling about the dorsum of the foot. Electronically Signed   By: Keane Police D.O.   On: 08/06/2021 12:33  ? ?MR ABDOMEN MRCP W WO CONTAST ? ?Result Date: 08/07/2021 ?CLINICAL DATA:  Liver mass, history of hepatitis-C EXAM: MRI ABDOMEN WITHOUT AND WITH CONTRAST (INCLUDING MRCP) TECHNIQUE: Multiplanar multisequence MR imaging of the abdomen was performed both before and after the administration of intravenous contrast. Heavily T2-weighted images of the biliary and pancreatic ducts were obtained, and three-dimensional MRCP images were rendered by post processing. CONTRAST:  27m GADAVIST GADOBUTROL 1 MMOL/ML IV SOLN COMPARISON:  CT abdomen and pelvis 08/05/2021 FINDINGS: Study is significantly limited due to motion. Lower chest: Small bilateral pleural effusions. Hepatobiliary: Liver is enlarged measuring 21.3 cm  in length with diffuse nodular contour consistent with cirrhosis. There is a 2 cm mildly hyperintense T2, hypointense T1 signal mass in the central right hepatic lobe which demonstrates increased DWI signal and is not appreciated on the postcontrast sequences, LR 3. Just medially in the central liver segment 4A, there is a faintly hyperintense T2 signal mass measuring approximately 5 x 4.9 cm which demonstrates mild early arterial postcontrast enhancement with rapid relative washout as well as an enhancing rim/capsule on the delayed phase,  LR 5. Gallbladder is somewhat contracted with wall thickening. No definite cholelithiasis identified. No biliary ductal dilatation appreciated. Pancreas: Not well visualized. No definite mass or evidence of ductal dilatation. Spleen:  Mildly enlarged. Adrenals/Urinary Tract: No adrenal mass visualized. No definite enhancing renal mass or hydronephrosis identified bilaterally. Stomach/Bowel: No evidence of bowel obstruction. Vascular/Lymphatic: No pathologically enlarged lymph nodes identified. No abdominal aortic aneurysm demonstrated. Other:  Trace ascites.  Subcutaneous edema. Musculoskeletal: No suspicious bone lesions identified. IMPRESSION: 1. Hepatic cirrhosis. 5 cm mass centered in segment 4A is LI-RADS 5, consistent with hepatocellular carcinoma. 2. Adjacent 2 cm mass in the central right hepatic lobe as described, LI-RADS 3. Consider follow-up in 3-6 months. 3. Mild splenomegaly likely secondary to portal hypertension. 4. Trace ascites. 5. Small bilateral pleural effusions. Abdominal wall subcutaneous edema noted. Electronically Signed   By: Ofilia Neas M.D.   On: 08/07/2021 14:37   ? ?Assessment / Plan: ? 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  Hg 4.6 -> 5.4 (baseline Hg 8.2 on 07/18/2021) -> transfused 2 units of PRBCs 4/10 -> Hg 7.2 -> Hg 7.1  on 4/12 -> transfused 1 units of PRBCs -> today Hg 6.9 -> transfused 1 unit of PRBC. Small bowel enteroscopy 4/11 identified mildly severe esophagitis, multiple duodenal polyps friable with contact oozing, likely source of Gi bleeding. The duodenal polyps were not removed the number and close proximity of the polyps in the duodenum was considered a high risk intervention. ?-CBC in am ?-Transfuse PRBCs as needed ?-Continue to hold Plavix. Per vascular surgeon Dr. Monica Martinez, ok to stop Plavix as patient is one month post vascular procedure,  continue ASA to maintain stent patency. ?-PPI po QD ?-Renal diet  ?-Monitor patient for  rectal bleeding or black stools  ?  ?2) PAD s/p s/p right SFA stenting by Dr. Standley Dakins 06/2021 on Plavix and ASA 81 QD. Per vascular surgeon Dr. Monica Martinez, ok to stop Plavix as patient is one month post procedure, continue ASA to maintain stent patency.  ?  ?3) ESRD.  ?-Nephrology following ?  ?4) DM DII ?  ?5) Right lung lesion, followed by pulmonology and oncology  ?  ?6) Liver mass identified per PET scan 07/23/2021 (done during his outpatient work up for a right pulmonary lesion). Abd/pelvic CTA 08/05/2021 identified a 4.4 x 4.2 x 4.4 cm liver mass near the juncture of segment of IVa and VIII and an adjacent 2.2 cm lesion in segment VIII. Metastatic disease or cholangiocarcinoma not excluded. 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 Labs dating back to 12/2018 showed a positive Hep C antibody and Hep C RNA quant level on 3,730,000. Never saw ID.  Hep C not treated. He denies any knowledge of having past hepatitis B or chronic hepatitis C infections. AFP and Hep C  RNA levels pending. MRI/MRCP with contrast 08/07/2021 identified a 5 cm mass centered in segment 4A consistent with [place] and an adjacent 2 cm mass in the central right hepatic lobe was also identified.  ?-I contacted oncologist [name], she will communicate with patient's primary oncologist Dr. Julien Nordmann to verify if oncology consult to be complete during this hospitalization verses as outpatient  ?-If future chemotherapy pursued, recommend close monitoring of his LFTs as past Hep B infection can reactive in setting of chemotherapy   ? 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  ?  ? ? ? ? ? ?Principal Problem: ?  Acute GI bleeding ?Active Problems: ?  Acute blood loss anemia ?  ESRD (end stage renal disease) on dialysis [facility]) ?  PAD  (peripheral artery disease) ([place]) ?  Lung mass ?  Hepatic lesion ?  Melena ?  Right foot pain ? ? ? ? LOS: 3 days  ? ?Daniel [name]  08/08/2021, 10:18 AM ? ? ?I have taken a history, reviewed the chart and

## 2021-08-08 NOTE — Progress Notes (Signed)
Daniel [name] GASTROENTEROLOGY ROUNDING NOTE ? ? ?Subjective: ?Patient had a formed stool yesterday but admits he did not look at it to see if it was bloody or not.  Otherwise no GI complains.   ?MRI yesterday showed a 5 cm lesion consistent with HCC and another indeterminate lesion (LIRADS 3). ?Hgb drifted down below 7, prompting another transfusion this morning (3 units total) ? ?Objective: ?Vital signs in last 24 hours: ?Temp:  [97.5 ?F (36.4 ?C)-98.6 ?F (37 ?C)] 98.1 ?F (36.7 ?C) (04/13 1004) ?Pulse Rate:  [81-96] 87 (04/13 1004) ?Resp:  [11-18] 16 (04/13 1004) ?BP: (117-213)/(26-89) 146/57 (04/13 1004) ?SpO2:  [95 %-100 %] 100 % (04/13 1004) ?Weight:  [616 kg-117.2 kg] 115 kg (04/12 1748) ?Last BM Date : 08/04/21 ?General:   Alert 61 year old male in no acute distress. ?Heart: Regular rate and rhythm, no murmurs ?Pulm: Breath sounds clear throughout ?Abdomen: Obese abdomen, soft.  Nontender.  No palpable mass.  Positive bowel sounds to all 4 quadrants. ?Extremities:  Without edema.  Left BKA. ?Neurologic:  Alert and  oriented x 4.  Speech is clear.  He answers questions to the best of his cognitive ability. ?Psych:  Alert and cooperative. Normal mood and affect ? ? ?Small bowel enteroscopy  08/06/2021: ?- Mildly severe esophagitis with no bleeding. Biopsied. Given localized circumferential ?inflammation, suspect pill esophagitis. ?- Gastritis. ?- Friable gastric mucosa. Biopsied. ?- Multiple duodenal polyps. Biopsied. These are most likely reactive/hyperplastic, not ?adenomatous. These polyps were friable with contact oozing, and thus may be a source of ?GI bleeding. No AVMs were seen on this examination. ?- The examined portion of the jejunum was normal. ? ?Intake/Output from previous day: ?04/12 0701 - 04/13 0700 ?In: -  ?Out: 2000  ?Intake/Output this shift: ?Total I/O ?In: 680 [P.O.:50; Blood:630] ?Out: 180 [Urine:180] ? ? ?Lab Results: ?Recent Labs  ?  08/06/21 ?1849 08/07/21 ?0320 08/08/21 ?0217  ?WBC 4.7 4.8  5.0  ?HGB 7.5* 7.1* 6.9*  ?PLT 129* 126* 119*  ?MCV 100.0 100.9* 103.3*  ? ?BMET ?Recent Labs  ?  08/05/21 ?1153 08/05/21 ?1224 08/06/21 ?0352 08/07/21 ?0320  ?NA 136 137 134* 135  ?K 4.2 4.3 4.7 3.8  ?CL 98 97* 95* 99  ?CO2 26  --  26 29  ?GLUCOSE 105* 102* 80 116*  ?BUN 47* 50* 51* 23  ?CREATININE 8.58* 9.80* 9.05* 5.09*  ?CALCIUM 8.6*  --  8.5* 8.3*  ? ?LFT ?Recent Labs  ?  08/05/21 ?1153 08/07/21 ?1443  ?PROT 6.9 6.8  ?ALBUMIN 2.5* 2.4*  ?AST 69* 89*  ?ALT 26 32  ?ALKPHOS 55 63  ?BILITOT 0.7 0.6  ?BILIDIR  --  0.2  ?IBILI  --  0.4  ? ?PT/INR ?No results for input(s): INR in the last 72 hours. ? ? ? ?Imaging/Other results: ?DG Foot 2 Views Right ? ?Result Date: 08/06/2021 ?CLINICAL DATA:  Chronic right foot pain for more than 6 months. No known injury. EXAM: RIGHT FOOT - 2 VIEW COMPARISON:  None. FINDINGS: There is no evidence of fracture or dislocation. Plantar calcaneal spurring. There is no evidence of significant arthropathy or other focal bone abnormality. Prominent vascular calcifications. Soft tissue swelling about the dorsum of the foot. IMPRESSION: 1.  No acute osseous abnormality. 2.  Plantar calcaneal spurring. 3. Prominent vascular calcifications and soft tissue swelling about the dorsum of the foot. Electronically Signed   By: Keane Police D.O.   On: 08/06/2021 12:33  ? ?MR ABDOMEN MRCP W WO CONTAST ? ?Result Date: 08/07/2021 ?  CLINICAL DATA:  Liver mass, history of hepatitis-C EXAM: MRI ABDOMEN WITHOUT AND WITH CONTRAST (INCLUDING MRCP) TECHNIQUE: Multiplanar multisequence MR imaging of the abdomen was performed both before and after the administration of intravenous contrast. Heavily T2-weighted images of the biliary and pancreatic ducts were obtained, and three-dimensional MRCP images were rendered by post processing. CONTRAST:  60m GADAVIST GADOBUTROL 1 MMOL/ML IV SOLN COMPARISON:  CT abdomen and pelvis 08/05/2021 FINDINGS: Study is significantly limited due to motion. Lower chest: Small bilateral  pleural effusions. Hepatobiliary: Liver is enlarged measuring 21.3 cm in length with diffuse nodular contour consistent with cirrhosis. There is a 2 cm mildly hyperintense T2, hypointense T1 signal mass in the central right hepatic lobe which demonstrates increased DWI signal and is not appreciated on the postcontrast sequences, LR 3. Just medially in the central liver segment 4A, there is a faintly hyperintense T2 signal mass measuring approximately 5 x 4.9 cm which demonstrates mild early arterial postcontrast enhancement with rapid relative washout as well as an enhancing rim/capsule on the delayed phase, LR 5. Gallbladder is somewhat contracted with wall thickening. No definite cholelithiasis identified. No biliary ductal dilatation appreciated. Pancreas: Not well visualized. No definite mass or evidence of ductal dilatation. Spleen:  Mildly enlarged. Adrenals/Urinary Tract: No adrenal mass visualized. No definite enhancing renal mass or hydronephrosis identified bilaterally. Stomach/Bowel: No evidence of bowel obstruction. Vascular/Lymphatic: No pathologically enlarged lymph nodes identified. No abdominal aortic aneurysm demonstrated. Other:  Trace ascites.  Subcutaneous edema. Musculoskeletal: No suspicious bone lesions identified. IMPRESSION: 1. Hepatic cirrhosis. 5 cm mass centered in segment 4A is LI-RADS 5, consistent with hepatocellular carcinoma. 2. Adjacent 2 cm mass in the central right hepatic lobe as described, LI-RADS 3. Consider follow-up in 3-6 months. 3. Mild splenomegaly likely secondary to portal hypertension. 4. Trace ascites. 5. Small bilateral pleural effusions. Abdominal wall subcutaneous edema noted. Electronically Signed   By: DOfilia NeasM.D.   On: 08/07/2021 14:37   ? ? ? ?Assessment and Plan: ? 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  Hg 4.6 -> 5.4 (baseline Hg 8.2 on  07/18/2021) -> transfused 2 units of PRBCs 4/10 -> Hg 7.2 -> today Hg 7.1 -> transfused 1 units of PRBCs. Small bowel enteroscopy 4/11 identified mildly severe esophagitis, multiple duodenal polyps friable with contact oozing, likely source of Gi bleeding. The duodenal polyps were not removed the number and close proximity of the polyps in the duodenum was considered a high risk intervention.  Hgb drifted below 7 this morning, but unclear if there was any overt GI bleeding ? ?-CBC in am ?-Continue to hold Plavix. Per vascular surgeon Dr. CMonica Martinez ok to stop Plavix as patient is one month post vascular procedure, continue ASA to maintain stent patency. ?-PPI po QD ?-Renal diet  ?-Monitor the patient for GI bleed ?- If no significant changes in hgb and no overt bleeding, would recommend discharge home tomorrow ?  ?2) PAD s/p s/p right SFA stenting by Dr. HStandley Dakins3/2023 on Plavix and ASA 81 QD. Per vascular surgeon Dr. CMonica Martinez ok to stop Plavix as patient is one month post procedure, continue ASA to maintain stent patency.  ?  ?3) ESRD. Last dialysis session was on 08/06/2021. ?-Nephrology following ?  ?4) DM DII ?  ?5) Right lung lesion, followed by pulmonology and oncology  ?  ?6) Liver mass identified per PET scan 07/23/2021 which was done  during his work up for a right pulmonary lesion. Abdominal MRI w/wo contrast recently ordered by Dr. Valeta Harms, but has not been completed. An abd/pelvic CTA 08/05/2021 identified a 4.4 x 4.2 x 4.4 cm liver mass neaer the juncture of segment of IVa and VIII and an adjacent 2.2 cm lesion in segment VIII. Metastatic disease or cholangiocarcinoma not excluded. Labs 08/05/2021 showed Hep B surface antigen nonreactive, Hep B core total antibody reactive with  Hep B surface antibody/Hep B-Post 41.9 indicating past hepatitis B infection with immunity. Hepatitis C antibody positive  01/10/2021 with HCV RNA 3,730,000 on 01/24/2019. HIV nonreactive 08/10/2020. Labs 08/05/2021 AST 69.   ALT 26.  Total bili 0.7.  Alk phos 55.  Patient denies any knowledge of having passed hepatitis B or hepatitis C. ?MRI 4/12 confirmed 5 cm lesion is LIRADS 5, consistent with HCC.  Another 2 cm is LIRADS3 ?

## 2021-08-08 NOTE — Progress Notes (Addendum)
?[facility] ?Progress Note  ? ?Subjective:   Reports he did not sleep at all last night, but otherwise feels ok. Hgb 6.9- finishing another PRBC transfusion. He denies SOB, CP, palpitations, dizziness, abdominal pain and nausea. Reports his LE swelling is stable. HD yest- removed 2000-  BP stayed high  ? ?Objective ?Vitals:  ? 08/08/21 0419 08/08/21 0445 08/08/21 0459 08/08/21 0820  ?BP: (!) 152/51 (!) 161/89 (!) 161/89 117/79  ?Pulse: 85 92 93 96  ?Resp: '15 14 14 15  '$ ?Temp: 98.4 ?F (36.9 ?C) 98.5 ?F (36.9 ?C) 98.5 ?F (36.9 ?C) 98.6 ?F (37 ?C)  ?TempSrc: Oral  Oral Oral  ?SpO2: 100%  99% 100%  ?Weight:      ?Height:      ? ?Physical Exam ?General: WDWN male, alert and in NAD ?Heart: RRR, no murmurs, rubs or gallops ?Lungs: CTA bilaterally without wheezing, rhonchi or rales ?Abdomen: Soft, non-distended, +BS ?Extremities: 1+ edema RLE ?Dialysis Access:  LUE AVF +t/b ? ?Additional Objective ?Labs: ?Basic Metabolic Panel: ?Recent Labs  ?Lab 08/05/21 ?1153 08/05/21 ?1224 08/06/21 ?0352 08/07/21 ?0320  ?NA 136 137 134* 135  ?K 4.2 4.3 4.7 3.8  ?CL 98 97* 95* 99  ?CO2 26  --  26 29  ?GLUCOSE 105* 102* 80 116*  ?BUN 47* 50* 51* 23  ?CREATININE 8.58* 9.80* 9.05* 5.09*  ?CALCIUM 8.6*  --  8.5* 8.3*  ? ?Liver Function Tests: ?Recent Labs  ?Lab 08/05/21 ?1153 08/07/21 ?1443  ?AST 69* 89*  ?ALT 26 32  ?ALKPHOS 55 63  ?BILITOT 0.7 0.6  ?PROT 6.9 6.8  ?ALBUMIN 2.5* 2.4*  ? ?No results for input(s): LIPASE, AMYLASE in the last 168 hours. ?CBC: ?Recent Labs  ?Lab 08/05/21 ?1153 08/05/21 ?1224 08/06/21 ?0352 08/06/21 ?3716 08/06/21 ?1849 08/07/21 ?0320 08/08/21 ?0217  ?WBC 4.7  --  5.1 5.7 4.7 4.8 5.0  ?NEUTROABS 2.5  --   --  3.5  --   --   --   ?HGB 4.6*   < > 6.3* 7.2* 7.5* 7.1* 6.9*  ?HCT 14.8*   < > 19.0* 22.4* 22.2* 21.7* 21.9*  ?MCV 119.4*  --  104.4* 100.4* 100.0 100.9* 103.3*  ?PLT 133*  --  136* 129* 129* 126* 119*  ? < > = values in this interval not displayed.  ? ?Blood Culture ?   ?Component Value  Date/Time  ? SDES BLOOD RIGHT HAND 04/14/2021 1035  ? SPECREQUEST  04/14/2021 1035  ?  BOTTLES DRAWN AEROBIC AND ANAEROBIC Blood Culture adequate volume  ? CULT  04/14/2021 1035  ?  NO GROWTH 5 DAYS ?Performed at [facility], [place] [address] [place], [place] [address] ?  ? REPTSTATUS 04/19/2021 FINAL 04/14/2021 1035  ? ? ?Cardiac Enzymes: ?No results for input(s): CKTOTAL, CKMB, CKMBINDEX, TROPONINI in the last 168 hours. ?CBG: ?Recent Labs  ?Lab 08/06/21 ?1157  ?GLUCAP 77  ? ?Iron Studies: No results for input(s): IRON, TIBC, TRANSFERRIN, FERRITIN in the last 72 hours. ?'@lablastinr3'$ @ ?Studies/Results: ?DG Foot 2 Views Right ? ?Result Date: 08/06/2021 ?CLINICAL DATA:  Chronic right foot pain for more than 6 months. No known injury. EXAM: RIGHT FOOT - 2 VIEW COMPARISON:  None. FINDINGS: There is no evidence of fracture or dislocation. Plantar calcaneal spurring. There is no evidence of significant arthropathy or other focal bone abnormality. Prominent vascular calcifications. Soft tissue swelling about the dorsum of the foot. IMPRESSION: 1.  No acute osseous abnormality. 2.  Plantar calcaneal spurring. 3. Prominent vascular calcifications  and soft tissue swelling about the dorsum of the foot. Electronically Signed   By: Keane Police D.O.   On: 08/06/2021 12:33  ? ?MR ABDOMEN MRCP W WO CONTAST ? ?Result Date: 08/07/2021 ?CLINICAL DATA:  Liver mass, history of hepatitis-C EXAM: MRI ABDOMEN WITHOUT AND WITH CONTRAST (INCLUDING MRCP) TECHNIQUE: Multiplanar multisequence MR imaging of the abdomen was performed both before and after the administration of intravenous contrast. Heavily T2-weighted images of the biliary and pancreatic ducts were obtained, and three-dimensional MRCP images were rendered by post processing. CONTRAST:  41m GADAVIST GADOBUTROL 1 MMOL/ML IV SOLN COMPARISON:  CT abdomen and pelvis 08/05/2021 FINDINGS: Study is significantly limited due to motion. Lower chest: Small bilateral pleural  effusions. Hepatobiliary: Liver is enlarged measuring 21.3 cm in length with diffuse nodular contour consistent with cirrhosis. There is a 2 cm mildly hyperintense T2, hypointense T1 signal mass in the central right hepatic lobe which demonstrates increased DWI signal and is not appreciated on the postcontrast sequences, LR 3. Just medially in the central liver segment 4A, there is a faintly hyperintense T2 signal mass measuring approximately 5 x 4.9 cm which demonstrates mild early arterial postcontrast enhancement with rapid relative washout as well as an enhancing rim/capsule on the delayed phase, LR 5. Gallbladder is somewhat contracted with wall thickening. No definite cholelithiasis identified. No biliary ductal dilatation appreciated. Pancreas: Not well visualized. No definite mass or evidence of ductal dilatation. Spleen:  Mildly enlarged. Adrenals/Urinary Tract: No adrenal mass visualized. No definite enhancing renal mass or hydronephrosis identified bilaterally. Stomach/Bowel: No evidence of bowel obstruction. Vascular/Lymphatic: No pathologically enlarged lymph nodes identified. No abdominal aortic aneurysm demonstrated. Other:  Trace ascites.  Subcutaneous edema. Musculoskeletal: No suspicious bone lesions identified. IMPRESSION: 1. Hepatic cirrhosis. 5 cm mass centered in segment 4A is LI-RADS 5, consistent with hepatocellular carcinoma. 2. Adjacent 2 cm mass in the central right hepatic lobe as described, LI-RADS 3. Consider follow-up in 3-6 months. 3. Mild splenomegaly likely secondary to portal hypertension. 4. Trace ascites. 5. Small bilateral pleural effusions. Abdominal wall subcutaneous edema noted. Electronically Signed   By: DOfilia NeasM.D.   On: 08/07/2021 14:37   ?Medications: ? ? aspirin EC  81 mg Oral Daily  ? atorvastatin  40 mg Oral Daily  ? calcium acetate  667 mg Oral TID WC  ? Chlorhexidine Gluconate Cloth  6 each Topical Q0600  ? ferrous sulfate  325 mg Oral Q breakfast  ?  gabapentin  100 mg Oral TID  ? lidocaine  1 application. Topical Q M,W,F  ? pantoprazole  40 mg Oral Q1200  ? saccharomyces boulardii  250 mg Oral BID  ? ? ?Dialysis Orders: ?[facility] MWF 4 hours BFR 450 DFR 500 EDW 117kg 2K 2Ca no heparin  ?Last HD was 4/7 for 3 hr 10 min, left 0.9kg above his EDW ?Mircera '225mg'$  given on 07/31/21 ?No VDRA ? ?Assessment/Plan: ? Symptomatic anemia: S/p 4 units PRBC this admission. Enteroscopy showed esophagitis, friable duodenol polyp, and no AVMs. transfuse PRN for Hgb <7.  ? ESRD:  Attends dialysis on MWF schedule. Next HD tomorrow, 08/09/21 ? Hypertension/volume: Edema on exam, have been lowering EDW outpatient. Tolerates UF goals ~3-4L. BP is stable. Continue UF with HD as tolerated ? Anemia: Due to #1 above, received mircera on 07/31/21 ? Metabolic bone disease: Calcium controlled, VDRA recently stopped due to hypercalcemia. Continue calcium acetate ?Right sided lung lesion: incidental finding during last admission. Saw pulmonology outpatient and plan was for outpatient imaging -  PET scan was not ominous  ?Liver lesion: Work up per GI, plan for MRI ?  ? ?Anice Paganini, PA-C ?08/08/2021, 9:35 AM  ?[facility] ?Pager: [phone] ? ?Patient seen and examined, agree with above note with above modifications. Patient still seems to be bleeding -  hgb trending down-   continuing with maintenance HD -  due tomorrow and titration of HD related meds  ?Corliss Parish, MD ?08/08/2021 ? ? ? ? ?

## 2021-08-08 NOTE — Consult Note (Signed)
? ?[name]  ?[phone]  ? ?HEMATOLOGY ONCOLOGY INPATIENT CONSULTATION  ? ?[name]  DOB: 24-Feb-1961  MR#: [id number]  CSN#: [id number]   ? ?Requesting Physician: [facility] ? ?Patient Care Team: ?[name], NP as PCP - General (Internal Medicine) ?Center, [place] ? ?Reason for consult: newly diagnosed [place]  ? 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  She has received multiple blood transfusions since admission.  During the work-up for GI bleeding, his CT angiogram of abdomen pelvis showed a large liver lesion, this was further evaluated by liver MRI with and without contrast on August 07, 2021, which showed a 5 cm mass centered in segment 4A, consistent with hepatocellular carcinoma, and a at adjacent 2 cm mass in right hepatic lobe, LI-RADS 3, no adenopathy or distant metastasis.  Of note on his recent CT chest in early March 2023, he was found to have a 2.2 cm subsolid density in right lower lobe of lung, he was evaluated by my partner Dr. Julien Nordmann for that.  However on subsequent PET scan from July 24, 2021, the lung lesion has resolved, no other hypermetabolic lesion was found on PET scan, no hypermetabolic liver lesion was seen.  His recent AFP was elevated at 13.9. ? ?MEDICAL HISTORY:  ?Past Medical History:  ?Diagnosis Date  ? Anemia   ? Diabetes mellitus without complication ([place])   ? patient denies  ? Dialysis patient [facility])   ? End stage chronic kidney disease ([place])   ? Hypertension   ? ICH (intracerebral hemorrhage) ([place]) 05/20/2017  ? PAD (peripheral artery disease) ([place])   ? Shoulder pain, left 06/28/2013  ? ? ?SURGICAL HISTORY: ?Past Surgical History:   ?Procedure Laterality Date  ? A/V FISTULAGRAM N/A 08/15/2020  ? Procedure: A/V FISTULAGRAM - Left Upper;  Surgeon: Cherre Robins, MD;  Location: [place] [facility];  Service: Cardiovascular;  Laterality: N/A;  ? ABDOMINAL AORTOGRAM W/LOWER EXTREMITY N/A 04/08/2021  ? Procedure: ABDOMINAL AORTOGRAM W/LOWER EXTREMITY;  Surgeon: Waynetta Sandy, MD;  Location: [place] [facility];  Service: Cardiovascular;  Laterality: N/A;  ? AMPUTATION Left 04/16/2021  ? Procedure: LEFT BELOW KNEE AMPUTATION;  Surgeon: Angelia Mould, MD;  Location: [facility] OR;  Service: Vascular;  Laterality: Left;  ? AMPUTATION Left 06/17/2021  ? Procedure: REVISION AMPUTATION BELOW KNEE;  Surgeon: Broadus John, MD;  Location: [name];  Service: Vascular;  Laterality: Left;  ? APPENDECTOMY    ? APPLICATION OF WOUND VAC Left 06/17/2021  ? Procedure: APPLICATION OF WOUND VAC;  Surgeon: Broadus John, MD;  Location: [place];  Service: Vascular;  Laterality: Left;  ? AV FISTULA PLACEMENT Left 08/03/2019  ? Procedure: LEFT ARM ARTERIOVENOUS (AV) CEPHALIC  FISTULA CREATION;  Surgeon: Waynetta Sandy, MD;  Location: [place];  Service: Vascular;  Laterality: Left;  ? BIOPSY  06/30/2019  ? Procedure: BIOPSY;  Surgeon: Ronald Lobo, MD;  Location: [place];  Service: Endoscopy;;  ? BIOPSY  08/02/2019  ? Procedure: BIOPSY;  Surgeon: Yetta Flock, MD;  Location: [facility] ENDOSCOPY;  Service: Gastroenterology;;  ? BIOPSY  02/08/2021  ? Procedure: BIOPSY;  Surgeon: Sharyn Creamer, MD;  Location: [facility] ENDOSCOPY;  Service: Gastroenterology;;  ? BIOPSY  02/24/2021  ?  Procedure: BIOPSY;  Surgeon: [name], MD;  Location: [facility]COPY;  Service: Gastroenterology;;  ? BIOPSY  03/26/2021  ? Procedure: BIOPSY;  Surgeon: Lavena Bullion, DO;  Location: [place] ENDOSCOPY;  Service: Gastroenterology;;  ? BIOPSY  08/06/2021  ? Procedure: BIOPSY;  Surgeon: [name], MD;  Location: [place];  Service: Gastroenterology;;  ?  COLONOSCOPY  01/23/2012  ? Procedure: COLONOSCOPY;  Surgeon: Danie Binder, MD;  [facility];  Service: Endoscopy;  Laterality: N/A;  11:10 AM  ? COLONOSCOPY WITH PROPOFOL N/A 06/30/2019  ? Procedure: COLONOSCOPY WITH PROPOFOL;  Surgeon: Ronald Lobo, MD;  Location: [place];  Service: Endoscopy;  Laterality: N/A;  ? ENTEROSCOPY N/A 08/02/2019  ? Procedure: ENTEROSCOPY;  Surgeon: Yetta Flock, MD;  Location: [facility] ENDOSCOPY;  Service: Gastroenterology;  Laterality: N/A;  ? ENTEROSCOPY N/A 02/24/2021  ? Procedure: ENTEROSCOPY;  Surgeon: [name], MD;  Location: [place];  Service: Gastroenterology;  Laterality: N/A;  ? ENTEROSCOPY N/A 03/26/2021  ? Procedure: ENTEROSCOPY;  Surgeon: Lavena Bullion, DO;  Location: [place];  Service: Gastroenterology;  Laterality: N/A;  ? ENTEROSCOPY N/A 07/05/2021  ? Procedure: ENTEROSCOPY;  Surgeon: Carol [place], MD;  Location: [place];  Service: Gastroenterology;  Laterality: N/A;  ? ENTEROSCOPY N/A 08/06/2021  ? Procedure: ENTEROSCOPY;  Surgeon: [name], MD;  Location: [place];  Service: Gastroenterology;  Laterality: N/A;  ? ESOPHAGOGASTRODUODENOSCOPY N/A 08/10/2020  ? Procedure: ESOPHAGOGASTRODUODENOSCOPY (EGD);  Surgeon: Jerene Bears, MD;  Location: [facility] ENDOSCOPY;  Service: Gastroenterology;  Laterality: N/A;  ? ESOPHAGOGASTRODUODENOSCOPY (EGD) WITH PROPOFOL N/A 06/30/2019  ? Procedure: ESOPHAGOGASTRODUODENOSCOPY (EGD) WITH PROPOFOL;  Surgeon: Ronald Lobo, MD;  Location: [place];  Service: Endoscopy;  Laterality: N/A;  ? ESOPHAGOGASTRODUODENOSCOPY (EGD) WITH PROPOFOL N/A 01/12/2021  ? Procedure: ESOPHAGOGASTRODUODENOSCOPY (EGD) WITH PROPOFOL;  Surgeon: Sharyn Creamer, MD;  Location: [place];  Service: Gastroenterology;  Laterality: N/A;  ? ESOPHAGOGASTRODUODENOSCOPY (EGD) WITH PROPOFOL N/A 02/08/2021  ? Procedure: ESOPHAGOGASTRODUODENOSCOPY (EGD) WITH PROPOFOL;  Surgeon: Sharyn Creamer, MD;  Location: [place];  Service: Gastroenterology;  Laterality: N/A;  ? FISTULA SUPERFICIALIZATION Left 10/17/2019  ? Procedure: LEFT UPPER EXTREMITY FISTULA REVISION, SIDE BRANCH LIGATION,  AND SUPERFICIALIZATION;  Surgeon: Marty Heck, MD;  Location: [place];  Service: Vascular;  Laterality: Left;  ? FISTULA SUPERFICIALIZATION Left 04/19/2021  ? Procedure: PLICATION OF ANEURYSM LEFT ARTERIOVENOUS FISTULA;  Surgeon: Angelia Mould, MD;  Location: [place];  Service: Vascular;  Laterality: Left;  ? FLEXIBLE SIGMOIDOSCOPY N/A 07/05/2021  ? Procedure: FLEXIBLE SIGMOIDOSCOPY;  Surgeon: Carol [place], MD;  Location: [place];  Service: Gastroenterology;  Laterality: N/A;  ? GIVENS CAPSULE STUDY N/A 06/30/2019  ? Procedure: GIVENS CAPSULE STUDY;  Surgeon: Ronald Lobo, MD;  Location: [facility] ENDOSCOPY;  Service: Endoscopy;  Laterality: N/A;  ? HEMOSTASIS CLIP PLACEMENT  08/10/2020  ? Procedure: HEMOSTASIS CLIP PLACEMENT;  Surgeon: Jerene Bears, MD;  Location: [facility] ENDOSCOPY;  Service: Gastroenterology;;  ? HEMOSTASIS CLIP PLACEMENT  01/12/2021  ? Procedure: HEMOSTASIS CLIP PLACEMENT;  Surgeon: Sharyn Creamer, MD;  Location: [place];  Service: Gastroenterology;;  ? HEMOSTASIS CONTROL  08/02/2019  ? Procedure: HEMOSTASIS CONTROL;  Surgeon: Yetta Flock, MD;  Location: [place];  Service: Gastroenterology;;  ? HOT HEMOSTASIS  02/24/2021  ? Procedure: HOT HEMOSTASIS (ARGON PLASMA COAGULATION/BICAP);  Surgeon: [name], MD;  Location: [facility] ENDOSCOPY;  Service: Gastroenterology;;  ? HOT HEMOSTASIS N/A 03/26/2021  ? Procedure: HOT HEMOSTASIS (ARGON PLASMA COAGULATION/BICAP);  Surgeon:  Cirigliano, Dominic Pea, DO;  Location: [place] ENDOSCOPY;  Service: Gastroenterology;  Laterality: N/A;  ? HOT HEMOSTASIS N/A 07/05/2021  ? Procedure: HOT HEMOSTASIS (ARGON PLASMA COAGULATION/BICAP);  Surgeon: Carol [place], MD;  Location: [place];  Service: Gastroenterology;  Laterality: N/A;  ? INCISION AND DRAINAGE ABSCESS N/A  06/29/2016  ? Procedure: INCISION AND DRAINAGE ABDOMINAL WALL ABSCESS;  Surgeon: Alphonsa Overall, MD;  Location: [facility];  Service: General;  Laterality: N/A;  ? INSERTION OF DIALYSIS CATHETER Right 08/03/2019  ? Proced

## 2021-08-08 NOTE — Progress Notes (Signed)
?  08/08/21 1420  ?Clinical Encounter Type  ?Visited With Patient  ?Visit Type Initial  ?Referral From Nurse ?Mayme Genta, RN placed on 08/08/2021 at 1102)  ?Consult/Referral To Chaplain ?Albertina Parr [place])  ?Spiritual Encounters  ?Spiritual Needs Other (Comment) ?(None)  ? ?Chaplain responded to Spiritual Care Consultation stating ?Patient requests prayer? placed by Mayme Genta, RN. Met Mr. Freund who was sitting up in a high back chair. Chaplain introduced self and inquired about his request for visit. Mr. Chain adamantly stated that he did not request a chaplain nor did he request prayer. He then said that I did not need to ask him anything else. Chaplain apologized for confusion and left. Patient record reflects that ?Patient Refused? to provide religion upon admission.  ? ?Orest Dikes, Ivin Poot., [phone]. ?

## 2021-08-08 NOTE — Progress Notes (Signed)
? ?[facility] ?PROGRESS NOTE ? ? ?AXTEN PASCUCCI ZMO:[id number] DOB: 04-12-1961 DOA: 08/05/2021  3 ?DOS: the patient was seen and examined on 08/08/2021 ? ?PCP: [name], NP ? ?Brief History and Hospital Course:  ?61 y.o. male with medical history significant of recurrent GI bleed with history of multiple AV malformations, chronic iron deficiency anemia, diverticulosis, ESRD on HD, PAD status post left BKA, HTN, IIDM, came with worsening of black tarry stool and low hemoglobin counts. Patient was recently hospitalized for GI bleed in March.  EGD showed nonbleeding duodenal ulcer and colonic diverticulosis.  Presented with a hemoglobin of 4.6.  Started on blood transfusion.  Hospitalized for further management.  Underwent enteroscopy on 4/11. ? ?Consultants: Gastroenterology.  Nephrology ? ?Procedures:  ?Small bowel enteroscopy 4/12 ?Impression:               - Mildly severe esophagitis with no bleeding.  ?                          Biopsied. Given localized circumferential  ?                          inflammation, suspect pill esophagitis. ?                          - Gastritis. ?                          - Friable gastric mucosa. Biopsied. ?                          - Multiple duodenal polyps. Biopsied. These are  ?                          most likely reactive/hyperplastic, not adenomatous.  ?                          These polyps were friable with contact oozing, and  ?                          thus may be a source of GI bleeding. No AVMs were  ?                          seen on this examination. ?                          - The examined portion of the jejunum was normal. ? ? ?Subjective: ?Sitting at the side of the bed.  Denies any complaints.  Had a bowel movement but did not look at his stool.  No charting noted in the EMR  ? ? ?Assessment/Plan: ? ? ?* Acute GI bleeding ?Patient with previous history of GI bleed secondary to AVMs.  Came in with few day history of melanotic stools.   Hemoglobin was 4.6 on admission.  Patient was hospitalized for further management. ?Gastroenterology was consulted.  Patient underwent small bowel enteroscopy.  Revealed esophagitis without active bleeding.  Biopsies were taken.  Gastritis was noted.  Multiple duodenal polyps were noted.  No overt bleeding was present. ?Patient's hemoglobin stable for the most part  but slight drift on noted today.  Additional unit of PRBC ordered. ?He did have a bowel movement but did not look at his stool.  No descriptions in the chart. ?PPI changed to once daily.  Discussed with GI.  Waiting on stability of his hemoglobin. ?Change PPI to once a day as per GI recommendations. ? ?Acute blood loss anemia ?Hemoglobin was 4.6 at presentation.  Status post multiple blood transfusions.  ?Hemoglobin 6.9 today.  Additional unit of PRBC ordered.  We will recheck later today and tomorrow.   ? ?ESRD (end stage renal disease) on dialysis [facility]) ?Dialyzed on Monday Wednesday Friday schedule.   ?Nephrology is following.  Dialyzed yesterday. ? ?Right foot pain ?Concerned about his right foot pain.  Results of x-rays were given to him.  He was asked to follow-up with his PCP and perhaps consider referral to orthopedics.  Symptoms have been ongoing for about 4 months.  Calcaneal spurring noted on the imaging study could be contributing to his symptoms.  Does not give any history of gout.  No findings suggestive of an acute inflammation. ? ?Hepatic lesion ?Patient was found to have a lung lesion for which he was seen by pulmonology.  Underwent PET scan in March which showed lesion in the liver for which MRI was recommended.   ?MRI ordered in the hospital by gastroenterology.  It raises concern for hepatocellular carcinoma.  Alpha-fetoprotein level is pending.  Patient already followed by Dr. Julien Nordmann for concern for lung cancer.  Likely this can be addressed in the outpatient setting.  Will defer to gastroenterology. ? ?Lung mass ?Followed by Dr.  Valeta Harms and Dr. Julien Nordmann for same.  Recently underwent PET scan.  Outpatient follow-up with oncology. ? ?PAD (peripheral artery disease) ([place]) ?Right SFA stenting on March 13th.   ?Per vascular surgery it is okay to stop the Plavix since it has been a month.  Remains on aspirin. ? ? ?Obesity ?Estimated body mass index is 35.36 kg/m? as calculated from the following: ?  Height as of this encounter: '5\' 11"'$  (1.803 m). ?  Weight as of this encounter: 115 kg. ? ? ?DVT Prophylaxis: SCDs ?Code Status: Full code ?Family Communication: Discussed with patient ?Disposition Plan: Discussed with gastroenterology.  Is requiring blood transfusion today.  Will need to be monitored for another 24 hours. ? ?Status is: Inpatient ?Remains inpatient appropriate because: GI bleed, severe anemia ? ? ? ? ?Medications: Scheduled: ? aspirin EC  81 mg Oral Daily  ? atorvastatin  40 mg Oral Daily  ? calcium acetate  667 mg Oral TID WC  ? Chlorhexidine Gluconate Cloth  6 each Topical Q0600  ? Chlorhexidine Gluconate Cloth  6 each Topical Q0600  ? ferrous sulfate  325 mg Oral Q breakfast  ? gabapentin  100 mg Oral TID  ? lidocaine  1 application. Topical Q M,W,F  ? pantoprazole  40 mg Oral Q1200  ? saccharomyces boulardii  250 mg Oral BID  ? ?Continuous: ? ? ?GYJ:EHUDJSHFWYO, HYDROcodone-acetaminophen, hydrOXYzine, loperamide, ondansetron **OR** ondansetron (ZOFRAN) IV ? ?Antibiotics: ?Anti-infectives (From admission, onward)  ? ? None  ? ?  ? ? ?Objective: ? ?Vital Signs ? ?Vitals:  ? 08/08/21 0445 08/08/21 0459 08/08/21 0820 08/08/21 1004  ?BP: (!) 161/89 (!) 161/89 117/79 (!) 146/57  ?Pulse: 92 93 96 87  ?Resp: '14 14 15 16  '$ ?Temp: 98.5 ?F (36.9 ?C) 98.5 ?F (36.9 ?C) 98.6 ?F (37 ?C) 98.1 ?F (36.7 ?C)  ?TempSrc:  Oral Oral Oral  ?SpO2:  99% 100% 100%  ?Weight:      ?Height:      ? ? ?Intake/Output Summary (Last 24 hours) at 08/08/2021 1020 ?Last data filed at 08/08/2021 1000 ?Gross per 24 hour  ?Intake 680 ml  ?Output 2180 ml  ?Net -1500 ml   ? ? ?Filed Weights  ? 08/06/21 1705 08/07/21 1432 08/07/21 1748  ?Weight: 119.3 kg 117.2 kg 115 kg  ? ? ?General appearance: Awake alert.  In no distress ?Resp: Clear to auscultation bilaterally.  Normal effort ?Cardio: S1-S2 is normal regular.  No S3-S4.  No rubs murmurs or bruit ?GI: Abdomen is soft.  Nontender nondistended.  Bowel sounds are present normal.  No masses organomegaly ?Extremities: Left lower extremity stump is stable.  Swelling in the right lower extremity is noted to be slightly better. ?Neurologic: Alert and oriented x3.  No focal neurological deficits.  ? ? ? ?Lab Results: ? ?Data Reviewed: I have personally reviewed labs and imaging study reports ? ?CBC: ?Recent Labs  ?Lab 08/05/21 ?1153 08/05/21 ?1224 08/06/21 ?0352 08/06/21 ?9702 08/06/21 ?1849 08/07/21 ?0320 08/08/21 ?0217  ?WBC 4.7  --  5.1 5.7 4.7 4.8 5.0  ?NEUTROABS 2.5  --   --  3.5  --   --   --   ?HGB 4.6*   < > 6.3* 7.2* 7.5* 7.1* 6.9*  ?HCT 14.8*   < > 19.0* 22.4* 22.2* 21.7* 21.9*  ?MCV 119.4*  --  104.4* 100.4* 100.0 100.9* 103.3*  ?PLT 133*  --  136* 129* 129* 126* 119*  ? < > = values in this interval not displayed.  ? ? ? ?Basic Metabolic Panel: ?Recent Labs  ?Lab 08/05/21 ?1153 08/05/21 ?1224 08/06/21 ?0352 08/07/21 ?0320  ?NA 136 137 134* 135  ?K 4.2 4.3 4.7 3.8  ?CL 98 97* 95* 99  ?CO2 26  --  26 29  ?GLUCOSE 105* 102* 80 116*  ?BUN 47* 50* 51* 23  ?CREATININE 8.58* 9.80* 9.05* 5.09*  ?CALCIUM 8.6*  --  8.5* 8.3*  ? ? ? ?GFR: ?Estimated Creatinine Clearance: 19.7 mL/min (A) (by C-G formula based on SCr of 5.09 mg/dL (H)). ? ?Liver Function Tests: ?Recent Labs  ?Lab 08/05/21 ?1153 08/07/21 ?1443  ?AST 69* 89*  ?ALT 26 32  ?ALKPHOS 55 63  ?BILITOT 0.7 0.6  ?PROT 6.9 6.8  ?ALBUMIN 2.5* 2.4*  ? ? ? ? ?Radiology Studies: ?DG Foot 2 Views Right ? ?Result Date: 08/06/2021 ?CLINICAL DATA:  Chronic right foot pain for more than 6 months. No known injury. EXAM: RIGHT FOOT - 2 VIEW COMPARISON:  None. FINDINGS: There is no evidence of  fracture or dislocation. Plantar calcaneal spurring. There is no evidence of significant arthropathy or other focal bone abnormality. Prominent vascular calcifications. Soft tissue swelling about the dorsum of the foot. IMPR

## 2021-08-09 ENCOUNTER — Telehealth: Payer: Self-pay | Admitting: Hematology

## 2021-08-09 DIAGNOSIS — D62 Acute posthemorrhagic anemia: Secondary | ICD-10-CM | POA: Diagnosis not present

## 2021-08-09 DIAGNOSIS — N186 End stage renal disease: Secondary | ICD-10-CM | POA: Diagnosis not present

## 2021-08-09 DIAGNOSIS — K921 Melena: Secondary | ICD-10-CM | POA: Diagnosis not present

## 2021-08-09 DIAGNOSIS — K922 Gastrointestinal hemorrhage, unspecified: Secondary | ICD-10-CM | POA: Diagnosis not present

## 2021-08-09 DIAGNOSIS — K769 Liver disease, unspecified: Secondary | ICD-10-CM | POA: Diagnosis not present

## 2021-08-09 DIAGNOSIS — C220 Liver cell carcinoma: Secondary | ICD-10-CM | POA: Diagnosis not present

## 2021-08-09 LAB — TYPE AND SCREEN
ABO/RH(D): B POS
Antibody Screen: NEGATIVE
Unit division: 0
Unit division: 0
Unit division: 0
Unit division: 0

## 2021-08-09 LAB — CBC
HCT: 24.1 % — ABNORMAL LOW (ref 39.0–52.0)
Hemoglobin: 7.7 g/dL — ABNORMAL LOW (ref 13.0–17.0)
MCH: 32.2 pg (ref 26.0–34.0)
MCHC: 32 g/dL (ref 30.0–36.0)
MCV: 100.8 fL — ABNORMAL HIGH (ref 80.0–100.0)
Platelets: 111 10*3/uL — ABNORMAL LOW (ref 150–400)
RBC: 2.39 MIL/uL — ABNORMAL LOW (ref 4.22–5.81)
RDW: 23.5 % — ABNORMAL HIGH (ref 11.5–15.5)
WBC: 4.5 10*3/uL (ref 4.0–10.5)
nRBC: 0 % (ref 0.0–0.2)

## 2021-08-09 LAB — BPAM RBC
Blood Product Expiration Date: 202304172359
Blood Product Expiration Date: 202304282359
Blood Product Expiration Date: 202305042359
Blood Product Expiration Date: 202305062359
ISSUE DATE / TIME: 202304101647
ISSUE DATE / TIME: 202304102153
ISSUE DATE / TIME: 202304110505
ISSUE DATE / TIME: 202304130430
Unit Type and Rh: 7300
Unit Type and Rh: 7300
Unit Type and Rh: 7300
Unit Type and Rh: 7300

## 2021-08-09 LAB — HEMOGLOBIN AND HEMATOCRIT, BLOOD
HCT: 24.5 % — ABNORMAL LOW (ref 39.0–52.0)
Hemoglobin: 8 g/dL — ABNORMAL LOW (ref 13.0–17.0)

## 2021-08-09 NOTE — Progress Notes (Signed)
Subjective: Seen on dialysis, no complaints tolerating UF currently, reported black stool last evening, hemoglobin 7 7 stable predialysis ? ?Objective ?Vital signs in last 24 hours: ?Vitals:  ? 08/08/21 0820 08/08/21 1004 08/08/21 1903 08/09/21 0705  ?BP: 117/79 (!) 146/57 (!) 152/73 140/67  ?Pulse: 96 87 96 86  ?Resp: '15 16 20 20  '$ ?Temp: 98.6 ?F (37 ?C) 98.1 ?F (36.7 ?C) 98.3 ?F (36.8 ?C) 98 ?F (36.7 ?C)  ?TempSrc: Oral Oral Oral Oral  ?SpO2: 100% 100% 98% 92%  ?Weight:      ?Height:      ? ?Weight change:  ? ?Physical Exam: ?General: Alert obese male, NAD on HD ?Heart: RRR no MRG ?Lungs: Rare left anterior wheeze, nonlabored breathing ?Abdomen: Obese, NABS, S, NT ND ?Extremities: 1+ edema RLE, L BKA dressing dry clear ?Dialysis Access: LUA AV fistula patent on HD ? ?Dialysis Orders: ?[facility] MWF 4 hours BFR 450 DFR 500 EDW 117kg 2K 2Ca no heparin  ?Last HD was 4/7 for 3 hr 10 min, left 0.9kg above his EDW ?Mircera '225mg'$  given on 07/31/21 ?No VDRA ?  ?AProblem/Plan: ? Symptomatic anemia: S(Hgb 4.6 on admit) s/p 5 units PRBC this admission. Enteroscopy showed esophagitis, friable duodenol polyp, and no AVMs. /A.m. Hgb predialysis 7.7 ,transfuse PRN for Hgb <7.  ? ESRD:  Attends dialysis on MWF on schedule today.,  Renal labs pending predialysis ? Hypertension/volume: Edema on exam, UF as tolerated today, have been lowering EDW outpatient..  Currently BP stable  ? Anemia: Due to #1 above, received mircera on 07/31/21 noted yesterday oncology/heme=  "will manage his anemia as outpatient setting" ? Metabolic bone disease: Calcium controlled, VDRA recently stopped due to hypercalcemia. Continue calcium acetate ?Right sided lung lesion: incidental finding during last admission. Saw pulmonology outpatient and plan was for outpatient imaging -  PET scan was not ominous  ?Chronic hepatitis C without hepatic coma= plan for admit ?Liver lesion: Per oncology Dr Charolotte Capuchin consistent with hepatocellular carcinoma no biopsy needed,  oncology to manage with follow-up noted ? ? ?Ernest Haber, PA-C ?[facility] ?Beeper [phone] ?08/09/2021,9:48 AM ? LOS: 4 days  ? ?Labs: ?Basic Metabolic Panel: ?Recent Labs  ?Lab 08/05/21 ?1153 08/05/21 ?1224 08/06/21 ?0352 08/07/21 ?0320  ?NA 136 137 134* 135  ?K 4.2 4.3 4.7 3.8  ?CL 98 97* 95* 99  ?CO2 26  --  26 29  ?GLUCOSE 105* 102* 80 116*  ?BUN 47* 50* 51* 23  ?CREATININE 8.58* 9.80* 9.05* 5.09*  ?CALCIUM 8.6*  --  8.5* 8.3*  ? ?Liver Function Tests: ?Recent Labs  ?Lab 08/05/21 ?1153 08/07/21 ?1443  ?AST 69* 89*  ?ALT 26 32  ?ALKPHOS 55 63  ?BILITOT 0.7 0.6  ?PROT 6.9 6.8  ?ALBUMIN 2.5* 2.4*  ? ?No results for input(s): LIPASE, AMYLASE in the last 168 hours. ?No results for input(s): AMMONIA in the last 168 hours. ?CBC: ?Recent Labs  ?Lab 08/05/21 ?1153 08/05/21 ?1224 08/06/21 ?9323 08/06/21 ?1849 08/07/21 ?0320 08/08/21 ?0217 08/08/21 ?1016 08/09/21 ?0315  ?WBC 4.7   < > 5.7 4.7 4.8 5.0  --  4.5  ?NEUTROABS 2.5  --  3.5  --   --   --   --   --   ?HGB 4.6*   < > 7.2* 7.5* 7.1* 6.9* 7.7* 7.7*  ?HCT 14.8*   < > 22.4* 22.2* 21.7* 21.9* 24.3* 24.1*  ?MCV 119.4*   < > 100.4* 100.0 100.9* 103.3*  --  100.8*  ?PLT 133*   < >  129* 129* 126* 119*  --  111*  ? < > = values in this interval not displayed.  ? ?Cardiac Enzymes: ?No results for input(s): CKTOTAL, CKMB, CKMBINDEX, TROPONINI in the last 168 hours. ?CBG: ?Recent Labs  ?Lab 08/06/21 ?1157  ?GLUCAP 77  ? ? ?Studies/Results: ?MR ABDOMEN MRCP W WO CONTAST ? ?Result Date: 08/07/2021 ?CLINICAL DATA:  Liver mass, history of hepatitis-C EXAM: MRI ABDOMEN WITHOUT AND WITH CONTRAST (INCLUDING MRCP) TECHNIQUE: Multiplanar multisequence MR imaging of the abdomen was performed both before and after the administration of intravenous contrast. Heavily T2-weighted images of the biliary and pancreatic ducts were obtained, and three-dimensional MRCP images were rendered by post processing. CONTRAST:  66m GADAVIST GADOBUTROL 1 MMOL/ML IV SOLN COMPARISON:  CT  abdomen and pelvis 08/05/2021 FINDINGS: Study is significantly limited due to motion. Lower chest: Small bilateral pleural effusions. Hepatobiliary: Liver is enlarged measuring 21.3 cm in length with diffuse nodular contour consistent with cirrhosis. There is a 2 cm mildly hyperintense T2, hypointense T1 signal mass in the central right hepatic lobe which demonstrates increased DWI signal and is not appreciated on the postcontrast sequences, LR 3. Just medially in the central liver segment 4A, there is a faintly hyperintense T2 signal mass measuring approximately 5 x 4.9 cm which demonstrates mild early arterial postcontrast enhancement with rapid relative washout as well as an enhancing rim/capsule on the delayed phase, LR 5. Gallbladder is somewhat contracted with wall thickening. No definite cholelithiasis identified. No biliary ductal dilatation appreciated. Pancreas: Not well visualized. No definite mass or evidence of ductal dilatation. Spleen:  Mildly enlarged. Adrenals/Urinary Tract: No adrenal mass visualized. No definite enhancing renal mass or hydronephrosis identified bilaterally. Stomach/Bowel: No evidence of bowel obstruction. Vascular/Lymphatic: No pathologically enlarged lymph nodes identified. No abdominal aortic aneurysm demonstrated. Other:  Trace ascites.  Subcutaneous edema. Musculoskeletal: No suspicious bone lesions identified. IMPRESSION: 1. Hepatic cirrhosis. 5 cm mass centered in segment 4A is LI-RADS 5, consistent with hepatocellular carcinoma. 2. Adjacent 2 cm mass in the central right hepatic lobe as described, LI-RADS 3. Consider follow-up in 3-6 months. 3. Mild splenomegaly likely secondary to portal hypertension. 4. Trace ascites. 5. Small bilateral pleural effusions. Abdominal wall subcutaneous edema noted. Electronically Signed   By: DOfilia NeasM.D.   On: 08/07/2021 14:37   ?Medications: ? ? aspirin EC  81 mg Oral Daily  ? atorvastatin  40 mg Oral Daily  ? calcium acetate   667 mg Oral TID WC  ? Chlorhexidine Gluconate Cloth  6 each Topical Q0600  ? Chlorhexidine Gluconate Cloth  6 each Topical Q0600  ? ferrous sulfate  325 mg Oral Q breakfast  ? gabapentin  100 mg Oral TID  ? lidocaine  1 application. Topical Q M,W,F  ? pantoprazole  40 mg Oral Q1200  ? saccharomyces boulardii  250 mg Oral BID  ? ? ? ? ?

## 2021-08-09 NOTE — Telephone Encounter (Signed)
Called to scheduled hospital follow up with feng. No voicemail. Will call again on Monday.  ?

## 2021-08-09 NOTE — Assessment & Plan Note (Addendum)
Hepatitis C virus quantitative noted to be 9.88 million.  Outpatient follow-up with GI. ?

## 2021-08-09 NOTE — Evaluation (Signed)
Physical Therapy Evaluation ?Patient Details ?Name: Daniel [name] ?MRN: [id number] ?DOB: [birth year] ?Today's Date: 08/09/2021 ? ?History of Present Illness ? Pt is a 61 y/o male presenting on 4/10 from SNF with hgb of 5.7. Found with acute GI bleed. Enteroscopy on 4/11 suspicious for liver cancer. PMH includes: anemia, DM, ESRD on HD (MWF), HTN, ICD, PAD s/p L BKA,diverticulosis.  ?Clinical Impression ? Pt presents to PT mobilizing well with walker in room. Appears pt was finished with his rehab at [facility] and will be returning to a boarding house. Will need rolling walker and wheelchair with cushion from PT standpoint prior to dc to home. Will follow acutely and recommend HHPT at DC.  ?   ? ?Recommendations for follow up therapy are one component of a multi-disciplinary discharge planning process, led by the attending physician.  Recommendations may be updated based on patient status, additional functional criteria and insurance authorization. ? ?Follow Up Recommendations Home health PT ? ?  ?Assistance Recommended at Discharge Intermittent Supervision/Assistance  ?Patient can return home with the following ? Help with stairs or ramp for entrance ? ?  ?Equipment Recommendations Wheelchair (measurements PT);Wheelchair cushion (measurements PT);Rolling walker (2 wheels)  ?Recommendations for Other Services ?    ?  ?Functional Status Assessment Patient has had a recent decline in their functional status and demonstrates the ability to make significant improvements in function in a reasonable and predictable amount of time.  ? ?  ?Precautions / Restrictions Precautions ?Precautions: Fall ?Restrictions ?Weight Bearing Restrictions: Yes ?LLE Weight Bearing: Non weight bearing ?Other Position/Activity Restrictions: no specific order, but has not been fitted for prosthesis, residual limb has not completely healed  ? ?  ? ?Mobility ? Bed Mobility ?  ?  ?  ?  ?  ?  ?  ?General bed mobility comments: Pt up on couch ?   ? ?Transfers ?Overall transfer level: Modified independent ?Equipment used: Rolling walker (2 wheels) ?  ?  ?  ?  ?  ?  ?  ?  ?  ? ?Ambulation/Gait ?Ambulation/Gait assistance: Supervision ?Gait Distance (Feet): 20 Feet ?Assistive device: Rolling walker (2 wheels) ?Gait Pattern/deviations: Step-to pattern (hop to) ?Gait velocity: decr ?Gait velocity interpretation: <1.31 ft/sec, indicative of household ambulator ?  ?General Gait Details: Pt with steady hop to gait using walker. Fatigues so distance limited to in room ? ?Stairs ?  ?  ?  ?  ?  ? ?Wheelchair Mobility ?  ? ?Modified Rankin (Stroke Patients Only) ?  ? ?  ? ?Balance Overall balance assessment: Needs assistance ?Sitting-balance support: No upper extremity supported, Feet supported ?Sitting balance-Leahy Scale: Good ?  ?  ?Standing balance support: Bilateral upper extremity supported, Reliant on assistive device for balance ?Standing balance-Leahy Scale: Poor ?Standing balance comment: walker for static standing ?  ?  ?  ?  ?  ?  ?  ?  ?  ?  ?  ?   ? ? ? ?Pertinent Vitals/Pain Pain Assessment ?Pain Assessment: No/denies pain  ? ? ?Home Living Family/patient expects to be discharged to:: Private residence ?Living Arrangements: Alone ?Available Help at Discharge: Family;Available PRN/intermittently ?Type of Home: House ?  ?  ?  ?  ?  ?  ?Additional Comments: pt has not moved into his new boarding house, states they are supposed to be building a ramp, he will be on the first floor and share a bathroom with a walk in shower, SNF was supposed to have ordered a w/c and BSC to  be delivered to the house, he doesn't know if that has happened  ?  ?Prior Function Prior Level of Function : Needs assist ?  ?  ?  ?  ?  ?  ?Mobility Comments: ambulating with RW at SNF with supervision ?ADLs Comments: set up for ADLs, reliant on SNF staff for IADLs ?  ? ? ?Hand Dominance  ? Dominant Hand: Right ? ?  ?Extremity/Trunk Assessment  ? Upper Extremity Assessment ?Upper  Extremity Assessment: Defer to OT evaluation ?  ? ?Lower Extremity Assessment ?Lower Extremity Assessment: Defer to PT evaluation ?  ? ?Cervical / Trunk Assessment ?Cervical / Trunk Assessment: Normal  ?Communication  ? Communication: No difficulties  ?Cognition Arousal/Alertness: Awake/alert ?Behavior During Therapy: [facility] for tasks assessed/performed ?Overall Cognitive Status: Impaired/Different from baseline ?Area of Impairment: Safety/judgement ?  ?  ?  ?  ?  ?  ?  ?  ?  ?  ?  ?  ?Safety/Judgement: Decreased awareness of safety ?  ?  ?  ?  ?  ? ?  ?General Comments   ? ?  ?Exercises    ? ?Assessment/Plan  ?  ?PT Assessment Patient needs continued PT services  ?PT Problem List Decreased mobility;Decreased balance ? ?   ?  ?PT Treatment Interventions DME instruction;Gait training;Stair training;Functional mobility training;Balance training;Patient/family education;Wheelchair mobility training   ? ?PT Goals (Current goals can be found in the Care Plan section)  ?Acute Rehab PT Goals ?PT Goal Formulation: With patient ?Time For Goal Achievement: 08/16/21 ?Potential to Achieve Goals: Good ? ?  ?Frequency Min 3X/week ?  ? ? ?Co-evaluation   ?  ?  ?  ?  ? ? ?  ?AM-PAC PT "6 Clicks" Mobility  ?Outcome Measure Help needed turning from your back to your side while in a flat bed without using bedrails?: None ?Help needed moving from lying on your back to sitting on the side of a flat bed without using bedrails?: None ?Help needed moving to and from a bed to a chair (including a wheelchair)?: None ?Help needed standing up from a chair using your arms (e.g., wheelchair or bedside chair)?: None ?Help needed to walk in hospital room?: A Little ?Help needed climbing 3-5 steps with a railing? : Total ?6 Click Score: 20 ? ?  ?End of Session   ?Activity Tolerance: Patient tolerated treatment well ?Patient left: in bed;with call bell/phone within reach ?  ?PT Visit Diagnosis: Other abnormalities of gait and mobility (R26.89) ?   ? ?Time: 4462-8638 ?PT Time Calculation (min) (ACUTE ONLY): 12 min ? ? ?Charges:   PT Evaluation ?$PT Eval Moderate Complexity: 1 Mod ?  ?  ?   ? ? ?[facility] PT ?Acute Rehabilitation Services ?Office [phone] ? ? ?Shary Decamp [facility] ?08/09/2021, 4:49 PM ? ?

## 2021-08-09 NOTE — Evaluation (Signed)
Occupational Therapy Evaluation ?Patient Details ?Name: Daniel [name] ?MRN: [id number] ?DOB: 1960-07-15 ?Today's Date: 08/09/2021 ? ? ?History of Present Illness Pt is a 61 y/o male presenting on 4/10 from SNF with hgb of 5.7. Found with acute GI bleed. Enteroscopy on 4/11 suspicious for liver cancer. PMH includes: anemia, DM, ESRD on HD (MWF), HTN, ICD, PAD s/p L BKA,diverticulosis.  ? ?Clinical Impression ?  ?Pt has been in SNF since his BKA 3 months ago. He is supposed to be moving into a boarding house and is not able to state definitively how it is set up. He reports the SNF was to order a w/c and [facility] for his impending discharge, but he was admitted to the hospital so he is unclear if they were delivered. Pt demonstrated ability to stand without assist, ambulate with RW and supervision to bathroom. He showered in sitting with set up and required supervision for safety to stand to dry his buttocks. Pt without regard for wet floor and fall risk. Will follow acutely. Recommend HHOT upon discharge.  ?   ? ?Recommendations for follow up therapy are one component of a multi-disciplinary discharge planning process, led by the attending physician.  Recommendations may be updated based on patient status, additional functional criteria and insurance authorization.  ? ?Follow Up Recommendations ? Home health OT  ?  ?Assistance Recommended at Discharge Intermittent Supervision/Assistance  ?Patient can return home with the following Help with stairs or ramp for entrance;Assist for transportation;A little help with bathing/dressing/bathroom;A little help with walking and/or transfers ? ?  ?Functional Status Assessment ? Patient has had a recent decline in their functional status and demonstrates the ability to make significant improvements in function in a reasonable and predictable amount of time.  ?Equipment Recommendations ? BSC/3in1;Wheelchair (measurements OT);Wheelchair cushion (measurements OT);Tub/shower seat (RW,  shower seat vs bench depending on tub or tub/shower)  ?  ?Recommendations for Other Services   ? ? ?  ?Precautions / Restrictions Precautions ?Precautions: Fall ?Restrictions ?Weight Bearing Restrictions: Yes ?LLE Weight Bearing: Non weight bearing ?Other Position/Activity Restrictions: no specific order, but has not been fitted for prosthesis, residual limb has not completely healed  ? ?  ? ?Mobility Bed Mobility ?Overal bed mobility: Modified Independent ?  ?  ?  ?  ?  ?  ?  ?  ? ?Transfers ?Overall transfer level: Modified independent ?Equipment used: Rolling walker (2 wheels) ?  ?  ?  ?  ?  ?  ?  ?  ?  ? ?  ?Balance Overall balance assessment: Needs assistance ?  ?Sitting balance-Leahy Scale: Good ?  ?  ?  ?Standing balance-Leahy Scale: Poor ?  ?  ?  ?  ?  ?  ?  ?  ?  ?  ?  ?  ?   ? ?ADL either performed or assessed with clinical judgement  ? ?ADL Overall ADL's : Needs assistance/impaired ?Eating/Feeding: Independent;Sitting ?  ?Grooming: Set up;Sitting ?  ?Upper Body Bathing: Set up;Sitting ?  ?Lower Body Bathing: Supervison/ safety;Sit to/from stand ?Lower Body Bathing Details (indicate cue type and reason): supervision when standing to dry buttocks ?Upper Body Dressing : Set up;Sitting ?  ?Lower Body Dressing: Set up;Sitting/lateral leans ?  ?Toilet Transfer: Supervision/safety;Ambulation;BSC/3in1;Rolling walker (2 wheels) ?Toilet Transfer Details (indicate cue type and reason): BSC over toilet ?Toileting- Clothing Manipulation and Hygiene: Modified independent;Sitting/lateral lean ?  ?  ?  ?Functional mobility during ADLs: Supervision/safety;Rolling walker (2 wheels) ?   ? ? ? ?Vision Ability  to See in Adequate Light: 0 Adequate ?Patient Visual Report: No change from baseline ?   ?   ?Perception   ?  ?Praxis   ?  ? ?Pertinent Vitals/Pain Pain Assessment ?Pain Assessment: No/denies pain  ? ? ? ?Hand Dominance Right ?  ?Extremity/Trunk Assessment Upper Extremity Assessment ?Upper Extremity Assessment: Overall  WFL for tasks assessed ?  ?Lower Extremity Assessment ?Lower Extremity Assessment: Defer to PT evaluation ?  ?Cervical / Trunk Assessment ?Cervical / Trunk Assessment: Normal ?  ?Communication Communication ?Communication: No difficulties ?  ?Cognition Arousal/Alertness: Awake/alert ?Behavior During Therapy: [facility] for tasks assessed/performed ?Overall Cognitive Status: Impaired/Different from baseline ?Area of Impairment: Safety/judgement ?  ?  ?  ?  ?  ?  ?  ?  ?  ?  ?  ?  ?Safety/Judgement: Decreased awareness of safety ?  ?  ?  ?  ?  ?General Comments    ? ?  ?Exercises   ?  ?Shoulder Instructions    ? ? ?Home Living Family/patient expects to be discharged to:: Private residence ?Living Arrangements: Alone ?Available Help at Discharge: Family;Available PRN/intermittently ?Type of Home: House ?  ?  ?  ?  ?  ?  ?Bathroom Shower/Tub: Walk-in shower ?  ?Bathroom Toilet: Standard ?  ?  ?  ?  ?Additional Comments: pt has not moved into his new boarding house, states they are supposed to be building a ramp, he will be on the first floor and share a bathroom with a walk in shower, SNF was supposed to have ordered a w/c and BSC to be delivered to the house, he doesn't know if that has happened ?  ? ?  ?Prior Functioning/Environment Prior Level of Function : Needs assist ?  ?  ?  ?  ?  ?  ?Mobility Comments: ambulating with RW at SNF with supervision ?ADLs Comments: set up for ADLs, reliant on SNF staff for IADLs ?  ? ?  ?  ?OT Problem List: Decreased safety awareness;Decreased knowledge of use of DME or AE;Impaired balance (sitting and/or standing) ?  ?   ?OT Treatment/Interventions: Self-care/ADL training;DME and/or AE instruction;Patient/family education;Balance training  ?  ?OT Goals(Current goals can be found in the care plan section) Acute Rehab OT Goals ?OT Goal Formulation: With patient ?Time For Goal Achievement: 08/23/21 ?Potential to Achieve Goals: Good ?ADL Goals ?Pt Will Perform Lower Body Bathing: with modified  independence;sitting/lateral leans ?Pt Will Transfer to Toilet: with modified independence;bedside commode;ambulating ?Pt Will Perform Tub/Shower Transfer: Shower transfer;with modified independence;ambulating;rolling walker;shower seat ?Additional ADL Goal #1: Pt will state at least 3 fall prevention measures at home as instructed.  ?OT Frequency: Min 2X/week ?  ? ?Co-evaluation   ?  ?  ?  ?  ? ?  ?AM-PAC OT "6 Clicks" Daily Activity     ?Outcome Measure Help from another person eating meals?: None ?Help from another person taking care of personal grooming?: A Little ?Help from another person toileting, which includes using toliet, bedpan, or urinal?: A Little ?Help from another person bathing (including washing, rinsing, drying)?: A Little ?Help from another person to put on and taking off regular upper body clothing?: A Little ?Help from another person to put on and taking off regular lower body clothing?: A Little ?6 Click Score: 19 ?  ?End of Session Equipment Utilized During Treatment: Rolling walker (2 wheels) ?Nurse Communication: Mobility status;Other (comment) (LE dressing needs replacing) ? ?Activity Tolerance: Patient tolerated treatment well ?Patient left: in bed;with call bell/phone  within reach ? ?OT Visit Diagnosis: Unsteadiness on feet (R26.81);Other abnormalities of gait and mobility (R26.89)  ?              ?Time: 9373-4287 ?OT Time Calculation (min): 34 min ?Charges:  OT General Charges ?$OT Visit: 1 Visit ?OT Evaluation ?$OT Eval Low Complexity: 1 Low ?OT Treatments ?$Self Care/Home Management : 8-22 mins ? ?Nestor Lewandowsky, OTR/L ?Acute Rehabilitation Services ?Pager: [phone] ?Office: [phone]  ?[name] ?08/09/2021, 3:40 PM ?

## 2021-08-09 NOTE — Progress Notes (Signed)
OT Cancellation Note ? ?Patient Details ?Name: Daniel [name] ?MRN: [id number] ?DOB: 1960-11-21 ? ? ?Cancelled Treatment:    Reason Eval/Treat Not Completed: Patient at procedure or test/ unavailable (Pt leaving for HD.) ? ?[name] ?08/09/2021, 9:19 AM ?Nestor Lewandowsky, OTR/L ?Acute Rehabilitation Services ?Pager: [phone] ?Office: [phone]  ?

## 2021-08-09 NOTE — Plan of Care (Signed)
?  Problem: Clinical Measurements: ?Goal: Ability to maintain clinical measurements within normal limits will improve ?Outcome: Progressing ?Goal: Will remain free from infection ?Outcome: Progressing ?Goal: Diagnostic test results will improve ?Outcome: Progressing ?Goal: Respiratory complications will improve ?Outcome: Progressing ?Goal: Cardiovascular complication will be avoided ?Outcome: Progressing ?  ?Problem: Elimination: ?Goal: Will not experience complications related to bowel motility ?Outcome: Progressing ?Goal: Will not experience complications related to urinary retention ?Outcome: Progressing ?  ?Problem: Pain Managment: ?Goal: General experience of comfort will improve ?Outcome: Progressing ?  ?Problem: Safety: ?Goal: Ability to remain free from injury will improve ?Outcome: Progressing ?  ?Problem: Skin Integrity: ?Goal: Risk for impaired skin integrity will decrease ?Outcome: Progressing ?  ?Problem: Education: ?Goal: Ability to identify signs and symptoms of gastrointestinal bleeding will improve ?Outcome: Progressing ?  ?Problem: Bowel/Gastric: ?Goal: Will show no signs and symptoms of gastrointestinal bleeding ?Outcome: Progressing ?  ?Problem: Fluid Volume: ?Goal: Will show no signs and symptoms of excessive bleeding ?Outcome: Progressing ?  ?Problem: Clinical Measurements: ?Goal: Complications related to the disease process, condition or treatment will be avoided or minimized ?Outcome: Progressing ?  ?

## 2021-08-09 NOTE — Progress Notes (Signed)
PT Cancellation Note ? ?Patient Details ?Name: Daniel [name] ?MRN: [id number] ?DOB: 1960/09/19 ? ? ?Cancelled Treatment:    Reason Eval/Treat Not Completed: Patient at procedure or test/unavailable. Pt currently off unit for HD. Will check back as schedule allows to initiate PT evaluation. ? ? ?Thelma Comp ?08/09/2021, 9:54 AM ? ?Rolinda Roan, PT, DPT ?Acute Rehabilitation Services ?Secure Chat Preferred ?Office: [phone] ? ?

## 2021-08-09 NOTE — Progress Notes (Addendum)
? ?[facility] ?PROGRESS NOTE ? ? ?PRITESH SOBECKI KZS:[id number] DOB: April 10, 1961 DOA: 08/05/2021  4 ?DOS: the patient was seen and examined on 08/09/2021 ? ?PCP: [name], NP ? ?Brief History and Hospital Course:  ?61 y.o. male with medical history significant of recurrent GI bleed with history of multiple AV malformations, chronic iron deficiency anemia, diverticulosis, ESRD on HD, PAD status post left BKA, HTN, IIDM, came with worsening of black tarry stool and low hemoglobin counts. Patient was recently hospitalized for GI bleed in March.  EGD showed nonbleeding duodenal ulcer and colonic diverticulosis.  Presented with a hemoglobin of 4.6.  Started on blood transfusion.  Hospitalized for further management.  Underwent enteroscopy on 4/11.  Required blood transfusion during this hospitalization. ? ?Consultants: Gastroenterology.  Nephrology ? ?Procedures:  ?Small bowel enteroscopy 4/12 ?Impression:               - Mildly severe esophagitis with no bleeding.  ?                          Biopsied. Given localized circumferential  ?                          inflammation, suspect pill esophagitis. ?                          - Gastritis. ?                          - Friable gastric mucosa. Biopsied. ?                          - Multiple duodenal polyps. Biopsied. These are  ?                          most likely reactive/hyperplastic, not adenomatous.  ?                          These polyps were friable with contact oozing, and  ?                          thus may be a source of GI bleeding. No AVMs were  ?                          seen on this examination. ?                          - The examined portion of the jejunum was normal. ? ? ?Subjective: ?Patient mentioned that he had a large amount of black stool yesterday evening.  Denies any abdominal pain nausea or vomiting.  He is under the impression that he is going back to his skilled nursing facility for rehab.   ? ? ?Assessment/Plan: ? ? ?*  Acute GI bleeding ?Patient with previous history of GI bleed secondary to AVMs.  Came in with few day history of melanotic stools.  Hemoglobin was 4.6 on admission.  Patient was hospitalized for further management. ?Gastroenterology was consulted.  Patient underwent small bowel enteroscopy.  Revealed esophagitis without active bleeding.  Biopsies were taken.  Gastritis was noted.  Multiple  duodenal polyps were noted.  No overt bleeding was present. ?Patient's hemoglobin did drift down some yesterday.  Additional unit of PRBC was transfused. ?He had a black stool yesterday evening.  Hemoglobin however noted to be stable this morning.  Could be reflective of passage of old blood. ?Continue PPI.  GI to weigh in again today. ? ?Acute blood loss anemia ?Hemoglobin was 4.6 at presentation.  Status post multiple blood transfusions.  Hemoglobin noted to be 7.7 this morning.  Stable compared to yesterday afternoon after blood transfusion. ? ?ESRD (end stage renal disease) on dialysis [facility]) ?Dialyzed on Monday Wednesday Friday schedule.   ?Nephrology is following.  ? ?Hepatic lesion ?Patient was found to have a lung lesion for which he was seen by pulmonology.  Underwent PET scan in March which showed lesion in the liver for which MRI was recommended.   ?MRI ordered in the hospital by gastroenterology.  It raises concern for hepatocellular carcinoma.  Alpha-fetoprotein level is 13.9 (reference ranges 0-8.4).  ?Patient already followed by Dr. Julien Nordmann for concern for lung cancer.  ?Seen by [name] yesterday.  Will need outpatient follow-up with her.  ? ?Chronic hepatitis C without hepatic coma ([place]) ?Hepatitis C virus quantitative noted to be 9.88 million. ? ?Lung mass ?Followed by Dr. Valeta Harms and Dr. Julien Nordmann for same.  Recently underwent PET scan.  Outpatient follow-up with oncology. ? ?PAD (peripheral artery disease) ([place]) ?Right SFA stenting on March 13th.   ?Per vascular surgery it is okay to stop the Plavix since it has  been a month.  Remains on aspirin. ? ?Right foot pain ?Concerned about his right foot pain.  Results of x-rays were given to him.  He was asked to follow-up with his PCP and perhaps consider referral to orthopedics.  Symptoms have been ongoing for about 4 months.  Calcaneal spurring noted on the imaging study could be contributing to his symptoms.  Does not give any history of gout.  No findings suggestive of an acute inflammation. ? ? ?Obesity ?Estimated body mass index is 35.36 kg/m? as calculated from the following: ?  Height as of this encounter: '5\' 11"'$  (1.803 m). ?  Weight as of this encounter: 115 kg. ? ? ?DVT Prophylaxis: SCDs ?Code Status: Full code ?Family Communication: Discussed with patient ?Disposition Plan: Waiting for clearance from gastroenterology.  Recheck hemoglobin later today.  Came here from SNF but unclear if he is going to go back there or not.  PT and OT evaluation is pending. ? ?Status is: Inpatient ?Remains inpatient appropriate because: GI bleed, severe anemia ? ? ? ? ?Medications: Scheduled: ? aspirin EC  81 mg Oral Daily  ? atorvastatin  40 mg Oral Daily  ? calcium acetate  667 mg Oral TID WC  ? Chlorhexidine Gluconate Cloth  6 each Topical Q0600  ? Chlorhexidine Gluconate Cloth  6 each Topical Q0600  ? ferrous sulfate  325 mg Oral Q breakfast  ? gabapentin  100 mg Oral TID  ? lidocaine  1 application. Topical Q M,W,F  ? pantoprazole  40 mg Oral Q1200  ? saccharomyces boulardii  250 mg Oral BID  ? ?Continuous: ? ? ?SFK:CLEXNTZGYFV, HYDROcodone-acetaminophen, hydrOXYzine, loperamide, ondansetron **OR** ondansetron (ZOFRAN) IV ? ?Antibiotics: ?Anti-infectives (From admission, onward)  ? ? None  ? ?  ? ? ?Objective: ? ?Vital Signs ? ?Vitals:  ? 08/09/21 1100 08/09/21 1130 08/09/21 1200 08/09/21 1230  ?BP: (!) 167/63 (!) 118/95 (!) 146/70 (!) 127/110  ?Pulse:  95 92 88  ?Resp:  $'20 20 20 20  'j$ ?Temp:      ?TempSrc:      ?SpO2:      ?Weight:      ?Height:      ? ?No intake or output data in the  24 hours ending 08/09/21 1253 ? ?Filed Weights  ? 08/06/21 1705 08/07/21 1432 08/07/21 1748  ?Weight: 119.3 kg 117.2 kg 115 kg  ? ? ?General appearance: Awake alert.  In no distress ?Resp: Clear to auscultation bilaterally.  Normal effort ?Cardio: S1-S2 is normal regular.  No S3-S4.  No rubs murmurs or bruit ?GI: Abdomen is soft.  Nontender nondistended.  Bowel sounds are present normal.  No masses organomegaly ?Status post left lower extremity amputation ?Neurologic: Alert and oriented x3.  No focal neurological deficits.  ? ? ? ?Lab Results: ? ?Data Reviewed: I have personally reviewed labs and imaging study reports ? ?CBC: ?Recent Labs  ?Lab 08/05/21 ?1153 08/05/21 ?1224 08/06/21 ?6387 08/06/21 ?1849 08/07/21 ?0320 08/08/21 ?0217 08/08/21 ?1016 08/09/21 ?0315  ?WBC 4.7   < > 5.7 4.7 4.8 5.0  --  4.5  ?NEUTROABS 2.5  --  3.5  --   --   --   --   --   ?HGB 4.6*   < > 7.2* 7.5* 7.1* 6.9* 7.7* 7.7*  ?HCT 14.8*   < > 22.4* 22.2* 21.7* 21.9* 24.3* 24.1*  ?MCV 119.4*   < > 100.4* 100.0 100.9* 103.3*  --  100.8*  ?PLT 133*   < > 129* 129* 126* 119*  --  111*  ? < > = values in this interval not displayed.  ? ? ?Basic Metabolic Panel: ?Recent Labs  ?Lab 08/05/21 ?1153 08/05/21 ?1224 08/06/21 ?0352 08/07/21 ?0320  ?NA 136 137 134* 135  ?K 4.2 4.3 4.7 3.8  ?CL 98 97* 95* 99  ?CO2 26  --  26 29  ?GLUCOSE 105* 102* 80 116*  ?BUN 47* 50* 51* 23  ?CREATININE 8.58* 9.80* 9.05* 5.09*  ?CALCIUM 8.6*  --  8.5* 8.3*  ? ? ?GFR: ?Estimated Creatinine Clearance: 19.7 mL/min (A) (by C-G formula based on SCr of 5.09 mg/dL (H)). ? ?Liver Function Tests: ?Recent Labs  ?Lab 08/05/21 ?1153 08/07/21 ?1443  ?AST 69* 89*  ?ALT 26 32  ?ALKPHOS 55 63  ?BILITOT 0.7 0.6  ?PROT 6.9 6.8  ?ALBUMIN 2.5* 2.4*  ? ? ? ?Radiology Studies: ?MR ABDOMEN MRCP W WO CONTAST ? ?Result Date: 08/07/2021 ?CLINICAL DATA:  Liver mass, history of hepatitis-C EXAM: MRI ABDOMEN WITHOUT AND WITH CONTRAST (INCLUDING MRCP) TECHNIQUE: Multiplanar multisequence MR imaging of  the abdomen was performed both before and after the administration of intravenous contrast. Heavily T2-weighted images of the biliary and pancreatic ducts were obtained, and three-dimensional MRCP images wer

## 2021-08-09 NOTE — Progress Notes (Addendum)
? ? ? ?[facility] Progress Note ? ?CC:  Anemia, GI bleed  ? ?Subjective: He passed one black stool last night, no further BM since then. No rectal bleeding. Decreased appetite. No N/V. In the dialysis unit at this time. He wants to go home.  ? ? ?Objective:  ?Vital signs in last 24 hours: ?Temp:  [98 ?F (36.7 ?C)-98.3 ?F (36.8 ?C)] 98 ?F (36.7 ?C) (04/14 8466) ?Pulse Rate:  [75-96] 88 (04/14 1230) ?Resp:  [18-20] 20 (04/14 1230) ?BP: (118-188)/(49-110) 127/110 (04/14 1230) ?SpO2:  [92 %-98 %] 92 % (04/14 0705) ?Last BM Date : 08/08/21 ?General:   Alert 61 year old male in NAD.  ?Heart:  Regular rate and rhythm, no murmurs. ?Pulm:  Breath sounds clear throughout. ?Abdomen:  Obese abdomen, soft.  Nontender.  No palpable mass.  Positive bowel sounds to all 4 quadrants. ?Extremities:   RLE with mild edema.  Left BKA. ?Neurologic:  Alert and  oriented x 4. Grossly normal neurologically. ?Psych:  Alert and cooperative. Normal mood and affect. ? ?Intake/Output from previous day: ?04/13 0701 - 04/14 0700 ?In: 680 [P.O.:50; Blood:630] ?Out: 180 [Urine:180] ?Intake/Output this shift: ?No intake/output data recorded. ? ?Lab Results: ?Recent Labs  ?  08/07/21 ?0320 08/08/21 ?0217 08/08/21 ?1016 08/09/21 ?5993 08/09/21 ?1309  ?WBC 4.8 5.0  --  4.5  --   ?HGB 7.1* 6.9* 7.7* 7.7* 8.0*  ?HCT 21.7* 21.9* 24.3* 24.1* 24.5*  ?PLT 126* 119*  --  111*  --   ? ?BMET ?Recent Labs  ?  08/07/21 ?0320  ?NA 135  ?K 3.8  ?CL 99  ?CO2 29  ?GLUCOSE 116*  ?BUN 23  ?CREATININE 5.09*  ?CALCIUM 8.3*  ? ?LFT ?Recent Labs  ?  08/07/21 ?1443  ?PROT 6.8  ?ALBUMIN 2.4*  ?AST 89*  ?ALT 32  ?ALKPHOS 63  ?BILITOT 0.6  ?BILIDIR 0.2  ?IBILI 0.4  ? ?PT/INR ?No results for input(s): LABPROT, INR in the last 72 hours. ?Hepatitis Panel ?Recent Labs  ?  08/07/21 ?1443  ?HEPBSAG NON REACTIVE  ? ? ?MR ABDOMEN MRCP W WO CONTAST ? ?Result Date: 08/07/2021 ?CLINICAL DATA:  Liver mass, history of hepatitis-C EXAM: MRI ABDOMEN WITHOUT AND WITH CONTRAST  (INCLUDING MRCP) TECHNIQUE: Multiplanar multisequence MR imaging of the abdomen was performed both before and after the administration of intravenous contrast. Heavily T2-weighted images of the biliary and pancreatic ducts were obtained, and three-dimensional MRCP images were rendered by post processing. CONTRAST:  30m GADAVIST GADOBUTROL 1 MMOL/ML IV SOLN COMPARISON:  CT abdomen and pelvis 08/05/2021 FINDINGS: Study is significantly limited due to motion. Lower chest: Small bilateral pleural effusions. Hepatobiliary: Liver is enlarged measuring 21.3 cm in length with diffuse nodular contour consistent with cirrhosis. There is a 2 cm mildly hyperintense T2, hypointense T1 signal mass in the central right hepatic lobe which demonstrates increased DWI signal and is not appreciated on the postcontrast sequences, LR 3. Just medially in the central liver segment 4A, there is a faintly hyperintense T2 signal mass measuring approximately 5 x 4.9 cm which demonstrates mild early arterial postcontrast enhancement with rapid relative washout as well as an enhancing rim/capsule on the delayed phase, LR 5. Gallbladder is somewhat contracted with wall thickening. No definite cholelithiasis identified. No biliary ductal dilatation appreciated. Pancreas: Not well visualized. No definite mass or evidence of ductal dilatation. Spleen:  Mildly enlarged. Adrenals/Urinary Tract: No adrenal mass visualized. No definite enhancing renal mass or hydronephrosis identified bilaterally. Stomach/Bowel: No evidence of bowel obstruction. Vascular/Lymphatic:  No pathologically enlarged lymph nodes identified. No abdominal aortic aneurysm demonstrated. Other:  Trace ascites.  Subcutaneous edema. Musculoskeletal: No suspicious bone lesions identified. IMPRESSION: 1. Hepatic cirrhosis. 5 cm mass centered in segment 4A is LI-RADS 5, consistent with hepatocellular carcinoma. 2. Adjacent 2 cm mass in the central right hepatic lobe as described, LI-RADS  3. Consider follow-up in 3-6 months. 3. Mild splenomegaly likely secondary to portal hypertension. 4. Trace ascites. 5. Small bilateral pleural effusions. Abdominal wall subcutaneous edema noted. Electronically Signed   By: Ofilia Neas M.D.   On: 08/07/2021 14:37   ? ?Assessment / Plan: ? ?48) 61 year old male with a history of ESRD on hemodialysis with acute on chronic anemia with recurrent GI bleeding secondary to small bowel AVMs.  Hg 4.6 -> 5.4 (baseline Hg 8.2 on 07/18/2021) -> transfused 2 units of PRBCs 4/10 -> Hg 7.2 -> Hg 7.1 -> transfused 1 units of PRBCs -> Hg 6.9 -> transfused 1 unit of PRBC 4/13. Today Hg 7.7 at 3AM  repeat Hg 8.0. Small bowel enteroscopy 4/11 identified mildly severe esophagitis, multiple duodenal polyps friable with contact oozing, likely source of GI bleeding. The duodenal polyps were not removed d/t the number and close proximity of the polyps in the duodenum, considered a high risk intervention. He passed a black stool x 1 last night, no BM, melena or rectal bleeding today. Hemodynamically stable.  ?-Continue to hold Plavix. Per vascular surgeon Dr. Monica Martinez, ok to stop Plavix as patient is one month post vascular procedure, continue ASA to maintain stent patency. ?-PPI po QD ?-Renal diet  ?-OK to discharge patient home from GI standpoint  ?-Our office will contact patient for follow up with Dr. Havery Moros  ?  ?2) PAD s/p s/p right SFA stenting by Dr. Standley Dakins 06/2021 on Plavix and ASA 81 QD. Per vascular surgeon Dr. Monica Martinez, ok to stop Plavix as patient is one month post procedure, continue ASA to maintain stent patency.  ?  ?3) ESRD.  ?-Nephrology following ?  ?4) DM DII ?  ?5) Right lung lesion, followed by pulmonology and oncology  ?  ?6) Liver mass identified per PET scan 07/23/2021 (done during his outpatient work up for a right pulmonary lesion). Abd/pelvic CTA 08/05/2021 identified a 4.4 x 4.2 x 4.4 cm liver mass near the juncture of segment of IVa and  VIII and an adjacent 2.2 cm lesion in segment VIII. Metastatic disease or cholangiocarcinoma not excluded. Hep B surface antigen negative, Hep B core total positive and Hep surface antibody positive indicates past Hep B exposure/infection with immunity. Labs dating back to 12/2018 showed a positive Hep C antibody and Hep C RNA quant level on 3,730,000. Never saw ID.  Hep C not treated. He denies any knowledge of having past hepatitis B or chronic hepatitis C infections. ANA negative. SMA and AMA pending. IgG 2,034. AFP 13.9. Hep C RNA levels 9,880,000. MRI/MRCP with contrast 08/07/2021 identified a 5 cm mass centered in segment 4A consistent with [place] and an adjacent 2 cm mass in the central right hepatic lobe was also identified.  Evaluated by oncologist [name], due to the central location of his large tumor, surgical resection may be challenging.  If the second lesion is [place], he would not be a candidate for liver transplant. ?-Patient to follow-up with oncologist [name] as an outpatient, possible candidate for liver targeted therapy ie: Y90, or SBRT ?-CBC follow up per oncology  ? 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  ?  ?  ?  ? ? ? ? ?Principal Problem: ?  Acute GI bleeding ?Active Problems: ?  Chronic hepatitis C without hepatic coma (HCC) ?  Acute blood loss anemia ?  ESRD (end stage renal disease) on dialysis [facility]) ?  PAD (peripheral artery disease) ([place]) ?  Lung mass ?  Hepatic lesion ?  Melena ?  Right foot pain ? ? ? ? LOS: 4 days  ? ?Noralyn Pick  08/09/2021, 1:30 PM ? ? ?I have taken a history, reviewed the chart and examined the patient. I performed a substantive portion of this encounter, including complete performance of at least one of the key components, in conjunction with the APP. I agree with the APP's note, impression and recommendations ? ?Patient's hgb stable since his transfusion 4/13.  Continues to pass dark stool, including another one this  afternoon.  Low suspicion for ongoing active bleeding given stable hgb, but will follow patient tomorrow and reassess.  ?No additional recommendations from NP Kennedy-Smith's note above. ? ?Maraya Gwilliam E. Candis Schatz,

## 2021-08-09 NOTE — Plan of Care (Signed)
?  Problem: Education: ?Goal: Knowledge of General Education information will improve ?Description: Including pain rating scale, medication(s)/side effects and non-pharmacologic comfort measures ?Outcome: Progressing ?  ?Problem: Health Behavior/Discharge Planning: ?Goal: Ability to manage health-related needs will improve ?Outcome: Progressing ?  ?Problem: Clinical Measurements: ?Goal: Ability to maintain clinical measurements within normal limits will improve ?Outcome: Progressing ?Goal: Will remain free from infection ?Outcome: Progressing ?Goal: Diagnostic test results will improve ?Outcome: Progressing ?Goal: Respiratory complications will improve ?Outcome: Progressing ?Goal: Cardiovascular complication will be avoided ?Outcome: Progressing ?  ?Problem: Coping: ?Goal: Level of anxiety will decrease ?Outcome: Progressing ?  ?Problem: Nutrition: ?Goal: Adequate nutrition will be maintained ?Outcome: Progressing ?  ?Problem: Activity: ?Goal: Risk for activity intolerance will decrease ?Outcome: Progressing ?  ?Problem: Elimination: ?Goal: Will not experience complications related to bowel motility ?Outcome: Progressing ?Goal: Will not experience complications related to urinary retention ?Outcome: Progressing ?  ?Problem: Safety: ?Goal: Ability to remain free from injury will improve ?Outcome: Progressing ?  ?Problem: Skin Integrity: ?Goal: Risk for impaired skin integrity will decrease ?Outcome: Progressing ?  ?Problem: Education: ?Goal: Ability to identify signs and symptoms of gastrointestinal bleeding will improve ?Outcome: Progressing ?  ?Problem: Fluid Volume: ?Goal: Will show no signs and symptoms of excessive bleeding ?Outcome: Progressing ?  ?Problem: Clinical Measurements: ?Goal: Complications related to the disease process, condition or treatment will be avoided or minimized ?Outcome: Progressing ?  ?

## 2021-08-10 ENCOUNTER — Telehealth: Payer: Self-pay | Admitting: Nurse Practitioner

## 2021-08-10 ENCOUNTER — Encounter: Payer: Self-pay | Admitting: Gastroenterology

## 2021-08-10 DIAGNOSIS — K922 Gastrointestinal hemorrhage, unspecified: Secondary | ICD-10-CM | POA: Diagnosis not present

## 2021-08-10 LAB — CBC
HCT: 23.7 % — ABNORMAL LOW (ref 39.0–52.0)
Hemoglobin: 7.7 g/dL — ABNORMAL LOW (ref 13.0–17.0)
MCH: 32.4 pg (ref 26.0–34.0)
MCHC: 32.5 g/dL (ref 30.0–36.0)
MCV: 99.6 fL (ref 80.0–100.0)
Platelets: 110 10*3/uL — ABNORMAL LOW (ref 150–400)
RBC: 2.38 MIL/uL — ABNORMAL LOW (ref 4.22–5.81)
RDW: 21.8 % — ABNORMAL HIGH (ref 11.5–15.5)
WBC: 4.6 10*3/uL (ref 4.0–10.5)
nRBC: 0 % (ref 0.0–0.2)

## 2021-08-10 MED ORDER — AMLODIPINE BESYLATE 2.5 MG PO TABS: 2.5000 mg | ORAL_TABLET | Freq: Every day | ORAL | 1 refills | Status: DC

## 2021-08-10 MED ORDER — FERROUS SULFATE 325 (65 FE) MG PO TABS: 325.0000 mg | ORAL_TABLET | Freq: Every morning | ORAL | 3 refills | Status: DC

## 2021-08-10 MED ORDER — CALCIUM ACETATE 667 MG PO TABS
1.0000 | ORAL_TABLET | Freq: Three times a day (TID) | ORAL | 2 refills | Status: DC
Start: 2021-08-10 — End: 2021-08-19

## 2021-08-10 MED ORDER — HYDROCODONE-ACETAMINOPHEN 5-325 MG PO TABS: 1.0000 | ORAL_TABLET | Freq: Four times a day (QID) | ORAL | 0 refills | Status: DC | PRN

## 2021-08-10 MED ORDER — ATORVASTATIN CALCIUM 40 MG PO TABS: 40.0000 mg | ORAL_TABLET | Freq: Every day | ORAL | 0 refills | Status: DC

## 2021-08-10 MED ORDER — PANTOPRAZOLE SODIUM 40 MG PO TBEC: 40.0000 mg | DELAYED_RELEASE_TABLET | Freq: Every day | ORAL | 2 refills | Status: DC

## 2021-08-10 MED ORDER — HYDROXYZINE HCL 25 MG PO TABS: 25.0000 mg | ORAL_TABLET | Freq: Three times a day (TID) | ORAL | 0 refills | Status: DC | PRN

## 2021-08-10 MED ORDER — GABAPENTIN 100 MG PO CAPS: 100.0000 mg | ORAL_CAPSULE | Freq: Three times a day (TID) | ORAL | 3 refills | Status: DC

## 2021-08-10 MED ORDER — ASPIRIN 81 MG PO TBEC: 81.0000 mg | DELAYED_RELEASE_TABLET | Freq: Every day | ORAL | 2 refills | Status: DC

## 2021-08-10 NOTE — Plan of Care (Signed)
?  Problem: Education: ?Goal: Knowledge of General Education information will improve ?Description: Including pain rating scale, medication(s)/side effects and non-pharmacologic comfort measures ?Outcome: Progressing ?  ?Problem: Health Behavior/Discharge Planning: ?Goal: Ability to manage health-related needs will improve ?Outcome: Progressing ?  ?Problem: Clinical Measurements: ?Goal: Ability to maintain clinical measurements within normal limits will improve ?Outcome: Progressing ?Goal: Cardiovascular complication will be avoided ?Outcome: Progressing ?  ?Problem: Pain Managment: ?Goal: General experience of comfort will improve ?Outcome: Progressing ?  ?Problem: Safety: ?Goal: Ability to remain free from injury will improve ?Outcome: Progressing ?  ?Problem: Skin Integrity: ?Goal: Risk for impaired skin integrity will decrease ?Outcome: Progressing ?  ?Problem: Clinical Measurements: ?Goal: Complications related to the disease process, condition or treatment will be avoided or minimized ?Outcome: Progressing ?  ?

## 2021-08-10 NOTE — TOC Initial Note (Signed)
Transition of Care (TOC) - Initial/Assessment Note  ? ? ?Patient Details  ?Name: Daniel [name] ?MRN: [id number] ?Date of Birth: 07-03-1960 ? ?Transition of Care (TOC) CM/SW Contact:    ?Amory Zbikowski G., RN ?Phone Number: ?08/10/2021, 11:10 AM ? ?Clinical Narrative:      ? ?            61 y.o. male with medical history significant of recurrent GI bleed with history of multiple AV malformations, chronic iron deficiency anemia, diverticulosis, ESRD on HD, PAD status post left BKA, HTN, IIDM, came with worsening of black tarry stool and low hemoglobin counts. ? RNCM  received orders for DME and [facility] PT/OT.  Choice offered to patient-patient with no preference so Jasmine at [employer] contacted for DME order-to be delivered to patient's address at his request.  Georgina Snell at [facility] contacted with [facility] PT/OT order and accepted. ? ?Expected Discharge Plan: Home/Self Care ?Barriers to Discharge: No Barriers Identified ? ?Patient Goals and CMS Choice ?Patient states their goals for this hospitalization and ongoing recovery are:: be independent ?CMS Medicare.gov Compare Post Acute Care list provided to:: Patient ?Choice offered to / list presented to : Patient ?Expected Discharge Plan and Services ?Expected Discharge Plan: Home/Self Care ?In-house Referral: Clinical Social Work ?Discharge Planning Services: CM Consult ?Post Acute Care Choice: Durable Medical Equipment ?Living arrangements for the past 2 months: [place] ?Expected Discharge Date: 08/10/21               ?DME Arranged: 3-N-1, Shower stool, Walker rolling, Wheelchair manual (3N1) ?DME Agency: [facility] ?Date DME Agency Contacted: 08/10/21 ?Time DME Agency Contacted: [phone] ?Representative spoke with at DME Agency: Jasmine-equipment to be delivered to home address at pt request ?[place] Arranged: PT, OT ?[place] Agency: [place] ?Date HH Agency Contacted: 08/10/21 ?Time [place]: 1110 ?Representative spoke with at [place]: Georgina Snell ?Prior Living  Arrangements/Services ?Living arrangements for the past 2 months: [place] ?Lives with:: Self ?Patient language and need for interpreter reviewed:: Yes ?Do you feel safe going back to the place where you live?: Yes      ?Need for Family Participation in Patient Care: No (Comment) ?Care giver support system in place?: Yes (comment) ?Current home services: Other (comment) (none) ?Criminal Activity/Legal Involvement Pertinent to Current Situation/Hospitalization: No - Comment as needed ?Activities of Daily Living ?Home Assistive Devices/Equipment: Wheelchair ?ADL Screening (condition at time of admission) ?Patient's cognitive ability adequate to safely complete daily activities?: Yes ?Is the patient deaf or have difficulty hearing?: No ?Does the patient have difficulty seeing, even when wearing glasses/contacts?: No ?Does the patient have difficulty concentrating, remembering, or making decisions?: No ?Patient able to express need for assistance with ADLs?: Yes ?Does the patient have difficulty dressing or bathing?: Yes ?Independently performs ADLs?: No ?Communication: Independent ?Dressing (OT): Needs assistance ?Is this a change from baseline?: Pre-admission baseline ?Grooming: Needs assistance ?Is this a change from baseline?: Pre-admission baseline ?Feeding: Independent ?Bathing: Needs assistance ?Is this a change from baseline?: Pre-admission baseline ?Toileting: Needs assistance ?Is this a change from baseline?: Pre-admission baseline ?In/Out Bed: Needs assistance ?Is this a change from baseline?: Pre-admission baseline ?Walks in Home: Dependent ?Is this a change from baseline?: Pre-admission baseline ?Does the patient have difficulty walking or climbing stairs?: Yes ?Weakness of Legs: Both ?Weakness of Arms/Hands: None ?Permission Sought/Granted ?Permission sought to share information with : Family Supports ?Permission granted to share information with : Yes, Verbal Permission Granted ? Share  Information with NAME: brother [name] ?   ?   ?   ?  Emotional Assessment ?Appearance:: Appears stated age ?Attitude/Demeanor/Rapport: Engaged ?Affect (typically observed): Appropriate, Pleasant ?Orientation: : Oriented to Self, Oriented to Place, Oriented to  Time, Oriented to Situation ?Alcohol / Substance Use: Not Applicable ?Psych Involvement: No (comment) ? ?Admission diagnosis:  Melena [K92.1] ?GI bleed [K92.2] ?ESRD (end stage renal disease) on dialysis ([place]) [N18.6, Z99.2] ?Gastrointestinal hemorrhage, unspecified gastrointestinal hemorrhage type [K92.2] ?Patient Active Problem List  ? Diagnosis Date Noted  ? Right foot pain 08/07/2021  ? Lung mass 08/06/2021  ? Hepatic lesion 08/06/2021  ? Melena   ? Pressure injury of skin 07/05/2021  ? Right leg pain 07/04/2021  ? GI bleeding 07/04/2021  ? Acute lower GI bleeding 07/03/2021  ? Acute on chronic diastolic CHF (congestive heart failure) ([place]) 06/14/2021  ? GERD (gastroesophageal reflux disease) 06/14/2021  ? Chest pain 06/13/2021  ? Diabetic foot infection ([place]) 04/14/2021  ? PAD (peripheral artery disease) ([place]) 04/08/2021  ? Gastritis and gastroduodenitis   ? ESRD (end stage renal disease) on dialysis ([place]) 03/25/2021  ? Diabetes mellitus type 2, controlled, with complications ([place]) 25/85/2778  ? QT prolongation 03/25/2021  ? Alcohol abuse with intoxication ([place]) 03/25/2021  ? Cocaine abuse ([place]) 03/25/2021  ? Tobacco use 03/25/2021  ? Class 2 obesity due to excess calories with body mass index (BMI) of 39.0 to 39.9 in adult 03/25/2021  ? AVM (arteriovenous malformation) of duodenum, acquired   ? Peptic ulcer disease   ? Upper GI bleed 03/24/2021  ? Chalazion of right upper eyelid 03/24/2021  ? Thrombocytopenia ([place]) 03/24/2021  ? Visual hallucination 03/24/2021  ? Bilateral leg edema 03/24/2021  ? Acute blood loss anemia   ? Benign neoplasm of duodenum, jejunum, and ileum   ? Anemia due to chronic kidney disease 02/23/2021  ? Prolonged QT interval 02/23/2021   ? Rotator cuff tear arthropathy of right shoulder 01/23/2021  ? Alcohol dependence ([place]) 01/13/2021  ? Gastric ulcer with hemorrhage 01/13/2021  ? Generalized weakness   ? Transaminitis 01/10/2021  ? History of alcohol abuse 10/27/2020  ? History of cocaine abuse ([place]) 10/27/2020  ? Nicotine dependence, cigarettes, uncomplicated 24/23/5361  ? Acute GI bleeding 08/10/2020  ? Duodenal ulcer with hemorrhage   ? 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 AVM (arteriovenous malformation) of small bowel, acquired with hemorrhage   ? Symptomatic anemia 07/31/2019  ? Chronic hepatitis C without hepatic coma ([place]) 07/11/2019  ? Iron deficiency anemia 03/30/2019  ? Alcohol use disorder 03/30/2019  ? B12 deficiency 03/30/2019  ? Orthostatic hypotension 01/22/2019  ? Essential hypertension 06/29/2016  ? DM2 (diabetes mellitus, type 2) ([place]) 06/29/2016  ? ?PCP:  [name], NP ?Pharmacy:   ?[facility] [place] ?[address] [place] ?[place] [address] ?Phone: [phone] Fax: [phone] ? ?Social Determinants of Health (SDOH) Interventions ?  ?Readmission Risk Interventions ? ?  02/27/2021  ? 12:51 PM 07/07/2019  ?  4:03 PM  ?Readmission Risk Prevention Plan  ?Transportation Screening Complete Complete  ?PCP or Specialist Appt within 3-5 Days  Complete  ?[place] or Home Care Consult  Complete  ?Social Work Consult for [place] Planning/Counseling  Complete  ?Palliative Care Screening  Not Applicable  ?Medication Review Press photographer) Complete Complete  ?PCP or Specialist appointment within 3-5 days of discharge Complete   ?[place] or Home Care Consult Complete   ?SW Recovery Care/Counseling Consult Complete   ?Palliative Care Screening Not Applicable   ?[place] Not Applicable   ? ? ? ?

## 2021-08-10 NOTE — Progress Notes (Signed)
PIV was accidental removed by patient. Patient stated he does not was another IV placed because he will be D/C today. Patient was educated but still refused.  ?

## 2021-08-10 NOTE — TOC CM/SW Note (Signed)
Patient called nursing station stating he has not received DME yet. Spoke with [place] with [employer] who indicates driver went to Mr. Macleod home to deliver equipment at 1354pm. However, there was no answer at the door. Driver with [employer] did not have the correct telephone number for patient. Writer requested with Delana Meyer to please ask driver to re-deliver equipment to patient's home. Writer provided [place] with [employer] the correct telephone number for patient [phone].  ? ?Spoke with [name] [phone] to make aware writer has requested [employer] to re-attempt delivery of DME equipment. Mr. Yaffe states he is sitting in his car currently and cannot get into the house until equipment has arrived. Advised Mr. Treinen to contact family to assist or call EMS to assist with getting into the home. Mr. Doolin declines calling EMS or family again. States he will sit in his car or outside until equipment arrives. ? ?[place] with [employer] states equipment will be re-delivered today and is listed as a priority. Made Mr. Lograsso aware. ? ?Marthenia Rolling, MSN, RN,BSN ?Inpatient [facility] Case Manager ?[phone]   ?

## 2021-08-10 NOTE — Discharge Summary (Signed)
?[facility] ? ?Physician Discharge Summary  ? ?Patient ID: ?Daniel [name] ?MRN: [id number] ?DOB/AGE: 12-09-1960 61 y.o. ? ?Admit date: 08/05/2021 ?Discharge date: 08/10/2021   ? ?PCP: [name], NP ? ?DISCHARGE DIAGNOSES:  ?Principal Problem: ?  Acute GI bleeding ?Active Problems: ?  Melena ?  Acute blood loss anemia ?  ESRD (end stage renal disease) on dialysis [facility]) ?  Chronic hepatitis C without hepatic coma (HCC) ?  Hepatocellular carcinoma ([place]) ?  Lung mass ?  PAD (peripheral artery disease) ([place]) ?  Right foot pain ? ? ?RECOMMENDATIONS FOR OUTPATIENT FOLLOW UP: ?Outpatient follow-up with Dr. Marc Morgans for parasellar carcinoma ?Outpatient follow-up with Dr. Valeta Harms and Dr. Julien Nordmann for presumed lung cancer ?Gastroenterology to arrange outpatient follow-up ?Patient to keep up with his hemodialysis regimen ? ? ?Home Health: PT OT ?Equipment/Devices: Wheelchair rolling walker ? ?CODE STATUS: Full code ? ?DISCHARGE CONDITION: fair ? ?Diet recommendation: As before ? ?INITIAL HISTORY: ?61 y.o. male with medical history significant of recurrent GI bleed with history of multiple AV malformations, chronic iron deficiency anemia, diverticulosis, ESRD on HD, PAD status post left BKA, HTN, IIDM, came with worsening of black tarry stool and low hemoglobin counts. Patient was recently hospitalized for GI bleed in March.  EGD showed nonbleeding duodenal ulcer and colonic diverticulosis.  Presented with a hemoglobin of 4.6.  Started on blood transfusion.  Hospitalized for further management.  Underwent enteroscopy on 4/11.  Required blood transfusion during this hospitalization. ? ?Consultants: [facility].  Nephrology.  Oncology ?  ?Procedures:  ?Small bowel enteroscopy 4/12 ?Impression:               - Mildly severe esophagitis with no bleeding.  ?                          Biopsied. Given localized circumferential  ?                          inflammation, suspect pill esophagitis. ?                           - Gastritis. ?                          - Friable gastric mucosa. Biopsied. ?                          - Multiple duodenal polyps. Biopsied. These are  ?                          most likely reactive/hyperplastic, not adenomatous.  ?                          These polyps were friable with contact oozing, and  ?                          thus may be a source of GI bleeding. No AVMs were  ?                          seen on this examination. ?                          -  The examined portion of the jejunum was normal. ? ? ? ?HOSPITAL COURSE:  ? ?* Acute GI bleeding ?Patient with previous history of GI bleed secondary to AVMs.  Came in with few day history of melanotic stools.  Hemoglobin was 4.6 on admission.  Patient was hospitalized for further management. ?Gastroenterology was consulted.  Patient underwent small bowel enteroscopy.  Revealed esophagitis without active bleeding.  Biopsies were taken.  Gastritis was noted.  Multiple duodenal polyps were noted.  No overt bleeding was present. ?Patient required additional transfusion.  Subsequently his hemoglobin remained stable.  He did have a few episodes of black stool but thought this was likely old blood.  Gastroenterology agreed with this assessment.  Stable for discharge today.  Continue PPI. ? ?Acute blood loss anemia ?Hemoglobin was 4.6 at presentation.  Status post multiple blood transfusions.  Hemoglobin stable for the last 48 hours. ? ?ESRD (end stage renal disease) on dialysis [facility]) ?Dialyzed on Monday Wednesday Friday schedule.   ? ?Hepatocellular carcinoma ([place]) ?Patient was found to have a lung lesion for which he was seen by pulmonology.  Underwent PET scan in March which showed lesion in the liver for which MRI was recommended.   ?MRI ordered in the hospital by gastroenterology.  It raises concern for hepatocellular carcinoma.  Alpha-fetoprotein level is 13.9 (reference ranges 0-8.4).  ?Patient already followed by Dr. Julien Nordmann for concern for lung  cancer.  ?Seen by [name].  She will arrange outpatient follow-up to discuss treatment options. ? ?Chronic hepatitis C without hepatic coma ([place]) ?Hepatitis C virus quantitative noted to be 9.88 million.  Outpatient follow-up with GI. ? ?Lung mass ?Followed by Dr. Valeta Harms and Dr. Julien Nordmann for same.  Recently underwent PET scan.  Outpatient follow-up with oncology. ? ?PAD (peripheral artery disease) ([place]) ?Right SFA stenting on March 13th.   ?Per vascular surgery it is okay to stop the Plavix since it has been a month.  Remains on aspirin. ? ?Right foot pain ?Concerned about his right foot pain.  Results of x-rays were given to him.  He was asked to follow-up with his PCP and perhaps consider referral to orthopedics.  Symptoms have been ongoing for about 4 months.  Calcaneal spurring noted on the imaging study could be contributing to his symptoms.  Does not give any history of gout.  No findings suggestive of an acute inflammation. ? ? ?Obesity ?Estimated body mass index is 35.36 kg/m? as calculated from the following: ?  Height as of this encounter: '5\' 11"'$  (1.803 m). ?  Weight as of this encounter: 115 kg. ? ?Patient stable.  Okay for discharge today.  He came in from a skilled nursing facility but now will be going to a boardinghouse since he has completed his rehab. ? ? ?PERTINENT LABS: ? ?The results of significant diagnostics from this hospitalization (including imaging, microbiology, ancillary and laboratory) are listed below for reference.   ? ? ?Labs: ? ?COVID-19 Labs ? ? ? ?Lab Results  ?Component Value Date  ? [place] NEGATIVE 07/03/2021  ? [place] NEGATIVE 06/19/2021  ? [place] NEGATIVE 06/13/2021  ? [place] NEGATIVE 04/22/2021  ? ? ? ? ?Basic Metabolic Panel: ?Recent Labs  ?Lab 08/05/21 ?1153 08/05/21 ?1224 08/06/21 ?0352 08/07/21 ?0320  ?NA 136 137 134* 135  ?K 4.2 4.3 4.7 3.8  ?CL 98 97* 95* 99  ?CO2 26  --  26 29  ?GLUCOSE 105* 102* 80 116*  ?BUN 47* 50* 51* 23  ?CREATININE 8.58* 9.80*  9.05* 5.09*  ?  CALCIUM 8.6*  --  8.5* 8.3*  ? ?Liver Function Tests: ?Recent Labs  ?Lab 08/05/21 ?1153 08/07/21 ?1443  ?AST 69* 89*  ?ALT 26 32  ?ALKPHOS 55 63  ?BILITOT 0.7 0.6  ?PROT 6.9 6.8  ?ALBUMIN 2.5* 2.4*  ? ? ?CBC: ?Recent Labs  ?Lab 08/05/21 ?1153 08/05/21 ?1224 08/06/21 ?6286 08/06/21 ?1849 08/07/21 ?0320 08/08/21 ?0217 08/08/21 ?1016 08/09/21 ?3817 08/09/21 ?1309 08/10/21 ?0202  ?WBC 4.7   < > 5.7 4.7 4.8 5.0  --  4.5  --  4.6  ?NEUTROABS 2.5  --  3.5  --   --   --   --   --   --   --   ?HGB 4.6*   < > 7.2* 7.5* 7.1* 6.9* 7.7* 7.7* 8.0* 7.7*  ?HCT 14.8*   < > 22.4* 22.2* 21.7* 21.9* 24.3* 24.1* 24.5* 23.7*  ?MCV 119.4*   < > 100.4* 100.0 100.9* 103.3*  --  100.8*  --  99.6  ?PLT 133*   < > 129* 129* 126* 119*  --  111*  --  110*  ? < > = values in this interval not displayed.  ? ? ?BNP: ?BNP (last 3 results) ?Recent Labs  ?  01/05/21 ?0505 01/06/21 ?1804 06/14/21 ?0356  ?BNP 87.9 193.9* 521.8*  ? ? ?CBG: ?Recent Labs  ?Lab 08/06/21 ?1157  ?GLUCAP 77  ? ? ? ?IMAGING STUDIES ?NM PET Image Initial (PI) Skull Base To Thigh ? ?Result Date: 07/24/2021 ?CLINICAL DATA:  Initial treatment strategy for RIGHT lung pulmonary nodule. EXAM: NUCLEAR MEDICINE PET SKULL BASE TO THIGH TECHNIQUE: 12.5 mCi F-18 FDG was injected intravenously. Full-ring PET imaging was performed from the skull base to thigh after the radiotracer. CT data was obtained and used for attenuation correction and anatomic localization. Fasting blood glucose: 94 mg/dl COMPARISON:  CT chest 07/04/2021 FINDINGS: Mediastinal blood pool activity: SUV max 2.4 Liver activity: SUV max NA NECK: No hypermetabolic lymph nodes in the neck. Incidental CT findings: none CHEST: The ground-glass nodule of concern in the RIGHT lower lobe is poorly demonstrated on the nondiagnostic CT accompanying the PET CT. There is no hypermetabolic nodule present within the RIGHT lower lobe. However there is hypermetabolic activity associated with atelectasis in the medial aspect  of the RIGHT lower lobe with SUV max equal 6.7 on image 79 of fused data set and image 79/series 4 of the CT data set. There is similar hypermetabolic activity associated with peripheral atelectasis/su

## 2021-08-10 NOTE — Progress Notes (Signed)
Nursing dc note ? ?Patient alert and oriented, verbalized understanding of instructions.all belongings and dc papers given to patient.  ?

## 2021-08-10 NOTE — Telephone Encounter (Signed)
Ammie, pls contact patient and schedule him for a hospital follow up (GI bleed) appointment with Dr. Havery Moros (he was the first physician to see patient in hospital 07/2019 so he would be the  GI physician for the is patient) or APP in 3 weeks.  ? ?Patient will follow up with oncology for labs THX ? ? ?

## 2021-08-11 ENCOUNTER — Encounter (HOSPITAL_COMMUNITY): Payer: Self-pay | Admitting: Emergency Medicine

## 2021-08-11 ENCOUNTER — Emergency Department (HOSPITAL_COMMUNITY)
Admission: EM | Admit: 2021-08-11 | Discharge: 2021-08-12 | Disposition: A | Discharge status: Home / Self Care | Payer: Medicare Other | Attending: Emergency Medicine | Admitting: Emergency Medicine

## 2021-08-11 ENCOUNTER — Telehealth: Payer: Self-pay | Admitting: Nephrology

## 2021-08-11 ENCOUNTER — Emergency Department (HOSPITAL_COMMUNITY): Payer: Medicare Other

## 2021-08-11 ENCOUNTER — Encounter (HOSPITAL_COMMUNITY): Payer: Self-pay

## 2021-08-11 ENCOUNTER — Other Ambulatory Visit: Payer: Self-pay

## 2021-08-11 DIAGNOSIS — W19XXXA Unspecified fall, initial encounter: Secondary | ICD-10-CM | POA: Insufficient documentation

## 2021-08-11 DIAGNOSIS — Z7982 Long term (current) use of aspirin: Secondary | ICD-10-CM | POA: Diagnosis not present

## 2021-08-11 DIAGNOSIS — S82092A Other fracture of left patella, initial encounter for closed fracture: Secondary | ICD-10-CM | POA: Diagnosis not present

## 2021-08-11 DIAGNOSIS — N186 End stage renal disease: Secondary | ICD-10-CM

## 2021-08-11 DIAGNOSIS — S8992XA Unspecified injury of left lower leg, initial encounter: Secondary | ICD-10-CM | POA: Diagnosis present

## 2021-08-11 DIAGNOSIS — S82002A Unspecified fracture of left patella, initial encounter for closed fracture: Secondary | ICD-10-CM

## 2021-08-11 DIAGNOSIS — Z992 Dependence on renal dialysis: Secondary | ICD-10-CM | POA: Insufficient documentation

## 2021-08-11 LAB — ANTI-SMOOTH MUSCLE ANTIBODY, IGG: F-Actin IgG: 24 Units — ABNORMAL HIGH (ref 0–19)

## 2021-08-11 LAB — MITOCHONDRIAL ANTIBODIES: Mitochondrial M2 Ab, IgG: <20 Units (ref 0.0–20.0)

## 2021-08-11 NOTE — ED Provider Triage Note (Signed)
Emergency Medicine Provider Triage Evaluation Note ? ?Daniel [name] , a 61 y.o. male  was evaluated in triage.  Pt complains of fall.  He had a mechanical fall today onto his stump.  He took a hydrocodone PTA.  He denies striking his head or other injuries.   ? ? ?Physical Exam  ?BP (!) 175/87   Pulse 75   Temp 97.9 ?F (36.6 ?C) (Oral)   Resp 18   SpO2 100%  ?Gen:   Awake, no distress   ?Resp:  Normal effort  ?MSK:   Moves extremities without difficulty  ?Other:  Left BKA.  ? ?Medical Decision Making  ?Medically screening exam initiated at 9:37 PM.  Appropriate orders placed.  Daniel [name] was informed that the remainder of the evaluation will be completed by another provider, this initial triage assessment does not replace that evaluation, and the importance of remaining in the ED until their evaluation is complete. ? ?Will obtain imaging.  ?  ?Lorin Glass, PA-C ?08/11/21 2140 ? ?

## 2021-08-11 NOTE — Telephone Encounter (Signed)
Transition of care contact from inpatient facility ? ?Date of discharge: 08/10/21 ?Date of contact: 08/11/21 ?Method: Phone ?Spoke to: Patient ? ?Patient contacted to discuss transition of care from recent inpatient hospitalization. Patient was admitted to [facility] from 08/05/21-08/10/21... with discharge diagnosis of  acute Gi bleeding, Hepatocellular carcinoma, PAd , Right foot pain... ? ?Medication changes were reviewed. He said no questions about that but fell off his ramp today with R leg swelling, stressed to him needs evaluation  if bleeding  and significant pain and should call ems. Currently he thinks he will wait , stressed need for HD tomorrow for follow up . ? ?Patient will follow up with his/her outpatient HD unit on: 08/12/21  [facility] ? ? ?

## 2021-08-11 NOTE — ED Triage Notes (Signed)
Pt brought to ED by [facility]  via wheelchair with c/ to pain on left BKA after fall.  Pt states he took one hydrocodone prior to arrival.  ?

## 2021-08-12 ENCOUNTER — Telehealth: Payer: Self-pay

## 2021-08-12 ENCOUNTER — Telehealth: Payer: Self-pay | Admitting: Hematology

## 2021-08-12 ENCOUNTER — Other Ambulatory Visit: Payer: Self-pay

## 2021-08-12 DIAGNOSIS — S82092A Other fracture of left patella, initial encounter for closed fracture: Secondary | ICD-10-CM | POA: Diagnosis not present

## 2021-08-12 LAB — I-STAT CHEM 8, ED
BUN: 32 mg/dL — ABNORMAL HIGH (ref 8–23)
Calcium, Ion: 1.02 mmol/L — ABNORMAL LOW (ref 1.15–1.40)
Chloride: 97 mmol/L — ABNORMAL LOW (ref 98–111)
Creatinine, Ser: 9 mg/dL — ABNORMAL HIGH (ref 0.61–1.24)
Glucose, Bld: 139 mg/dL — ABNORMAL HIGH (ref 70–99)
HCT: 27 % — ABNORMAL LOW (ref 39.0–52.0)
Hemoglobin: 9.2 g/dL — ABNORMAL LOW (ref 13.0–17.0)
Potassium: 3.5 mmol/L (ref 3.5–5.1)
Sodium: 131 mmol/L — ABNORMAL LOW (ref 135–145)
TCO2: 22 mmol/L (ref 22–32)

## 2021-08-12 MED ORDER — OXYCODONE HCL 5 MG PO TABS
10.0000 mg | ORAL_TABLET | Freq: Once | ORAL | Status: AC
  Administered 2021-08-12: 10 mg via ORAL
  Filled 2021-08-12: qty 2

## 2021-08-12 MED ORDER — FLUCONAZOLE 100 MG PO TABS: 100.0000 mg | ORAL_TABLET | Freq: Every day | ORAL | 0 refills | Status: DC

## 2021-08-12 MED ORDER — OXYCODONE-ACETAMINOPHEN 5-325 MG PO TABS
1.0000 | ORAL_TABLET | Freq: Once | ORAL | Status: DC
  Filled 2021-08-12: qty 1

## 2021-08-12 MED ORDER — OXYCODONE-ACETAMINOPHEN 5-325 MG PO TABS
1.0000 | ORAL_TABLET | Freq: Once | ORAL | Status: AC
  Administered 2021-08-12: 1 via ORAL

## 2021-08-12 NOTE — Telephone Encounter (Signed)
Scheduled appt per 4/13 staff msg from [name]. Called pt, no answer. Left msg with appt date and time. Requested for pt to call back to confirm appt.  ?

## 2021-08-12 NOTE — ED Notes (Signed)
Ortho tech at bedside 

## 2021-08-12 NOTE — ED Notes (Signed)
Paged Ortho Tech per Dr Reather Converse oral order ?

## 2021-08-12 NOTE — ED Notes (Signed)
RN spoke with social worker to see about transport to dialysis at 26. SSW stated to use cab voucher at this time.  ?

## 2021-08-12 NOTE — Progress Notes (Addendum)
Late Entry Note: ? ?Contacted [facility] and spoke to [place]. Clinic advised that pt was d/c from hospital on Saturday and returned to ED evening of 4/16. Clinic advised that it appears that pt was d/c from ED this morning and pt may be en route to clinic for out-pt HD appt. Pt's out-pt schedule is MWF.  ? ?Melven Sartorius ?Renal Navigator ?[phone] ?

## 2021-08-12 NOTE — Discharge Instructions (Addendum)
Wear knee immobilizer as recommended by technician, keep wounds covered and watch for signs of infection.  Follow-up with your orthopedic surgeon for reassessment. ?For severe pain take your norco or vicodin however realize they have the potential for addiction and it can make you sleepy and has tylenol in it.  No operating machinery while taking. ? ? ? ?

## 2021-08-12 NOTE — Telephone Encounter (Signed)
Per Carl Best, CRNP's request, pt has been scheduled for hosp f/u with Dr. Havery Moros on 09/04/21 @ 4pm. Appt reminder has been mailed. Appt will reflect on AVS upon hosp discharge for pt future reference. ? ?

## 2021-08-12 NOTE — Progress Notes (Signed)
Orthopedic Tech Progress Note ?Patient Details:  ?Daniel [name] ?[birth year] ?[id number] ? ?Called in order to [employer] for a [employer] with the works for a [place]  ? ?Patient ID: Daniel [name], male   DOB: 07-01-1960, 61 y.o.   MRN: [id number] ? ?Daniel [name] ?08/12/2021, 9:00 AM ? ?

## 2021-08-12 NOTE — Telephone Encounter (Signed)
-----   Message from [name], MD sent at 08/10/2021  8:17 PM EDT ----- ?Hi Timiyah Romito,  ?I routed an inpatient path letter to you in which esophageal biopsies showed candida.  Can you call him and let him know the results and place a prescription for fluconazole 100 mg PO daily (lower dose for ESRD) for 14 days?  If he has questions, let me know and I can call him ?Thanks,  ?Dr. Loletha Grayer ? ?

## 2021-08-12 NOTE — ED Provider Notes (Signed)
?[facility] ?Provider Note ? ? ?CSN: [id number] ?Arrival date & time: 08/11/21  2130 ? ?  ? ?History ? ?Chief Complaint  ?Patient presents with  ? Fall  ? ? ?Daniel [name] is a 61 y.o. male. ? ?Patient presents after mechanical fall while using his walker.  Patient denies any lightheadedness, syncope, chest pain or other reason for his fall.  Patient denies any bleeding.  Patient has had recent admission for anemia and blood in the stools.  Patient end-stage renal disease on dialysis due for dialysis today at 11:00 AM.  Patient fell directly on the left knee.  No other injuries.  Pain with any range of motion, mild swelling.  Vascular surgery performed procedure approximately 4 months ago left below-knee amputation.  Patient has hydrocodone at home for pain. ? ? ?  ? ?Home Medications ?Prior to Admission medications   ?Medication Sig Start Date End Date Taking? Authorizing Provider  ?amLODipine (NORVASC) 2.5 MG tablet Take 1 tablet (2.5 mg total) by mouth at bedtime. 08/10/21  Yes Bonnielee Haff, MD  ?aspirin 81 MG EC tablet Take 1 tablet (81 mg total) by mouth daily. Swallow whole. 08/10/21  Yes Bonnielee Haff, MD  ?atorvastatin (LIPITOR) 40 MG tablet Take 1 tablet (40 mg total) by mouth daily. 08/10/21  Yes Bonnielee Haff, MD  ?Calcium Acetate 667 MG TABS Take 1 tablet by mouth in the morning, at noon, and at bedtime. 08/10/21  Yes Bonnielee Haff, MD  ?ferrous sulfate 325 (65 FE) MG tablet Take 1 tablet (325 mg total) by mouth every morning. 08/10/21  Yes Bonnielee Haff, MD  ?gabapentin (NEURONTIN) 100 MG capsule Take 1 capsule (100 mg total) by mouth 3 (three) times daily. 08/10/21  Yes Bonnielee Haff, MD  ?HYDROcodone-acetaminophen (NORCO/VICODIN) 5-325 MG tablet Take 1 tablet by mouth every 6 (six) hours as needed for severe pain. 08/10/21  Yes Bonnielee Haff, MD  ?hydrOXYzine (ATARAX) 25 MG tablet Take 1 tablet (25 mg total) by mouth every 8 (eight) hours as needed  for itching. 08/10/21  Yes Bonnielee Haff, MD  ?loperamide (IMODIUM A-D) 2 MG tablet Take 4 mg by mouth in the morning, at noon, and at bedtime.   Yes [provider]  ?Multiple Vitamin (MULTIVITAMIN WITH MINERALS) TABS tablet Take 1 tablet by mouth every morning.   Yes [provider]  ?pantoprazole (PROTONIX) 40 MG tablet Take 1 tablet (40 mg total) by mouth daily. 08/10/21  Yes Bonnielee Haff, MD  ?saccharomyces boulardii (FLORASTOR) 250 MG capsule Take 1 capsule (250 mg total) by mouth 2 (two) times daily. 04/23/21  Yes Bonnielee Haff, MD  ?blood glucose meter kit and supplies KIT Dispense based on patient and insurance preference. Use up to four times daily as directed. (FOR ICD-9 250.00, 250.01). 07/01/16   Nita Sells, MD  ?colchicine 0.6 MG tablet Take 0.5 tablets (0.3 mg total) by mouth 2 (two) times daily. 07/24/20 07/29/20  Noemi Chapel, MD  ?furosemide (LASIX) 40 MG tablet Take 1 tablet (40 mg total) by mouth daily. 07/25/19 02/09/20  Ladona Horns, MD  ?   ? ?Allergies    ?Seroquel [quetiapine] and Dilaudid [hydromorphone hcl]   ? ?Review of Systems   ?Review of Systems  ?Constitutional:  Negative for chills and fever.  ?HENT:  Negative for congestion.   ?Eyes:  Negative for visual disturbance.  ?Respiratory:  Negative for shortness of breath.   ?Cardiovascular:  Negative for chest pain.  ?Gastrointestinal:  Negative for abdominal pain and  vomiting.  ?Genitourinary:  Negative for dysuria and flank pain.  ?Musculoskeletal:  Positive for gait problem and joint swelling. Negative for back pain, neck pain and neck stiffness.  ?Skin:  Negative for rash.  ?Neurological:  Negative for light-headedness and headaches.  ? ?Physical Exam ?Updated Vital Signs ?BP (!) 163/88 (BP Location: Right Arm)   Pulse 91   Temp 97.8 ?F (36.6 ?C) (Oral)   Resp 18   SpO2 100%  ?Physical Exam ?Vitals and nursing note reviewed.  ?Constitutional:   ?   General: He is not in acute distress. ?   Appearance:  He is well-developed.  ?HENT:  ?   Head: Normocephalic and atraumatic.  ?   Mouth/Throat:  ?   Mouth: Mucous membranes are moist.  ?Eyes:  ?   General:     ?   Right eye: No discharge.     ?   Left eye: No discharge.  ?   Conjunctiva/sclera: Conjunctivae normal.  ?Neck:  ?   Trachea: No tracheal deviation.  ?Cardiovascular:  ?   Rate and Rhythm: Normal rate.  ?Pulmonary:  ?   Effort: Pulmonary effort is normal.  ?Abdominal:  ?   General: There is no distension.  ?   Palpations: Abdomen is soft.  ?   Tenderness: There is no abdominal tenderness. There is no guarding.  ?Musculoskeletal:     ?   General: Swelling, tenderness and signs of injury present. No deformity.  ?   Cervical back: Normal range of motion and neck supple. No rigidity.  ?   Comments: Patient has left knee joint effusion, moderate tenderness anterior patella region.  Patient has pain with flexion extension.  Patient is superficial abrasions distal left knee, no gaping wounds.  No significant tenderness to distal stump.  ?Skin: ?   General: Skin is warm.  ?   Capillary Refill: Capillary refill takes less than 2 seconds.  ?Neurological:  ?   General: No focal deficit present.  ?   Mental Status: He is alert.  ?   Cranial Nerves: No cranial nerve deficit.  ?Psychiatric:     ?   Mood and Affect: Mood normal.  ? ? ?ED Results / Procedures / Treatments   ?Labs ?(all labs ordered are listed, but only abnormal results are displayed) ?Labs Reviewed  ?I-STAT CHEM 8, ED - Abnormal; Notable for the following components:  ?    Result Value  ? Sodium 131 (*)   ? Chloride 97 (*)   ? BUN 32 (*)   ? Creatinine, Ser 9.00 (*)   ? Glucose, Bld 139 (*)   ? Calcium, Ion 1.02 (*)   ? Hemoglobin 9.2 (*)   ? HCT 27.0 (*)   ? All other components within normal limits  ? ? ?EKG ?None ? ?Radiology ?DG Pelvis 1-2 Views ? ?Result Date: 08/11/2021 ?CLINICAL DATA:  [place] EXAM: PELVIS - 1-2 VIEW COMPARISON:  None. FINDINGS: Frontal view of the pelvis includes both hips. No acute  fracture. Alignment is anatomic. Mild symmetrical bilateral hip joint space narrowing. Diffuse atherosclerosis. IMPRESSION: 1. Mild osteoarthritis.  No acute fracture. Electronically Signed   By: Randa Ngo M.D.   On: 08/11/2021 22:32  ? ?DG FEMUR MIN 2 VIEWS LEFT ? ?Result Date: 08/11/2021 ?CLINICAL DATA:  [place], left BKA EXAM: LEFT FEMUR 2 VIEWS COMPARISON:  02/11/2021 FINDINGS: Frontal and lateral views of the left femur are obtained. Left femur is unremarkable without evidence of acute fracture. On the lateral view,  there is a displaced fracture through the lower pole of the left patella, with large associated joint effusion. Visualized portions of the left tibia and fibula are unremarkable, with evidence of prior below-knee amputation. There is diffuse atherosclerosis. Visualized portions of the left hemipelvis are unremarkable. IMPRESSION: 1. Displaced fracture through the lower pole of the left patella, with large associated joint effusion. 2. Unremarkable left femur. Electronically Signed   By: Randa Ngo M.D.   On: 08/11/2021 22:32   ? ?Procedures ?Procedures  ? ? ?Medications Ordered in ED ?Medications  ?oxyCODONE-acetaminophen (PERCOCET/ROXICET) 5-325 MG per tablet 1 tablet (1 tablet Oral Given 08/12/21 0500)  ?oxyCODONE (Oxy IR/ROXICODONE) immediate release tablet 10 mg (10 mg Oral Given 08/12/21 0815)  ? ? ?ED Course/ Medical Decision Making/ A&P ?  ?                        ?Medical Decision Making ?Risk ?Prescription drug management. ? ? ?Patient presents with mechanical fall and isolated left knee injury.  X-rays ordered and reviewed showing patella fracture acute.  Fortunately patient has no femur fracture or significant open wounds especially given below-knee amputation proximally 4 months ago.  Medical records reviewed given patient's complex medical history, Dr. Doren Custard performed vascular surgery/below-knee amputation followed up in February 2.  Patient had recent admission last week for blood  in the stools and received blood transfusion hemoglobin 8 range.  With recent admission basic blood work obtained and potassium normal, hemoglobin 9 range. ? ?Discussed with orthopedic technician for knee immobil

## 2021-08-12 NOTE — Telephone Encounter (Signed)
Transition Care Management Unsuccessful Follow-up Telephone Call ? ?Date of discharge and from where:  08/10/2021, [facility]  ? ?Attempts:  1st Attempt ? ?Reason for unsuccessful TCM follow-up call:  Left voice message on # [phone].  Call back requested.  ? ?Need to discuss scheduling a hospital follow up appointment with PCP,  ? ? ? ?

## 2021-08-12 NOTE — Telephone Encounter (Signed)
Spoke with pt and he is aware of results and knows to pick up the prescription that was sent in for diflucan. ?

## 2021-08-13 ENCOUNTER — Telehealth: Payer: Self-pay

## 2021-08-13 NOTE — Telephone Encounter (Signed)
Transition Care Management Unsuccessful Follow-up Telephone Call ? ?Date of discharge and from where:  08/10/2021, [facility]  ? ?Attempts:  2nd Attempt ? ?Reason for unsuccessful TCM follow-up call:  Left voice message on [phone].  Call back requested.  Need to discuss scheduling a hospital follow up appointment with PCP ? ? ? ?

## 2021-08-14 ENCOUNTER — Telehealth: Payer: Self-pay

## 2021-08-14 ENCOUNTER — Telehealth (INDEPENDENT_AMBULATORY_CARE_PROVIDER_SITE_OTHER): Payer: Self-pay | Admitting: Primary Care

## 2021-08-14 NOTE — Telephone Encounter (Signed)
[name] with [name] called in for verbal orders. ? ?[name] met with pt today. Pt needs orders for a nurse for wound care. Pt also need orders for social worker to help with community resources.  ? ? ?[name] is also requesting PT orders Frequency: 1 week 1 and 2 week 5.  ? ? ?[name] expressed that pt is suppose to be taking iron pills but doesn't have a Rx.  ? ? ?Pharmacy:  ?[place], [address] Phone:  [phone]  ?Fax:  [phone]  ?  ? ? ?Please assist further.  ? ? ?CB: [phone] ?

## 2021-08-14 NOTE — Telephone Encounter (Signed)
Transition Care Management Unsuccessful Follow-up Telephone Call ? ?Date of discharge and from where:  08/10/2021, [facility]  ? ?Attempts:  3rd Attempt ? ?Reason for unsuccessful TCM follow-up call:  Left voice message [phone].  Call back requested.  Need to discuss scheduling a hospital follow up appointment with PCP   ? ?Letter sent to patient requesting he contact [facility] to schedule an appointment.  ? ? ? ?

## 2021-08-14 NOTE — Telephone Encounter (Signed)
Transition Care Management Follow-up Telephone Call ?Date of discharge and from where: 08/10/2021, [facility]  ?How have you been since you were released from the hospital? He was upset that he does not have the correct wheelchair and had to borrow one from a friend.  ?Any questions or concerns? Yes - He is supposed to attend dialysis M/W/F at [facility]; but he missed dialysis Monday  - 08/12/2021 and today.   He said that the dialysis center arranges transportation but he has not called them to discuss his need for assistance. He also explained that his ramp is being repaired and he needs to be picked up at the back of his house. ?I called [facility] # [phone], spoke to Daniel [name] who said she would notify Daniel [name], SW about patient's need for assistance with transportation and she ask her to contact the patient.  ? ?Items Reviewed: ?Did the pt receive and understand the discharge instructions provided? Yes  ?Medications obtained and verified? Yes  - he said he has all of his medications and did not have any questions about the med regime.  ?Other? No  ?Any new allergies since your discharge? No  ?Dietary orders reviewed? Yes ?Do you have support at home?  He lives in a [place] ? ?Home Care and Equipment/Supplies: ?Were home health services ordered? no ?If so, what is the name of the agency? N/a  ?Has the agency set up a time to come to the patient's home? not applicable ?Were any new equipment or medical supplies ordered?  Yes: wheelchair ?What is the name of the medical supply agency? [employer]  ?Were you able to get the supplies/equipment? yes ?Do you have any questions related to the use of the equipment or supplies? Yes: He said that the wheelchair that was delivered is too big and the company is supposed to bring him a new one; but he has not heard from anyone.  When asked if he has called the company he said he didn't know who delivered it.  I gave him the phone number for  [employer] and instructed him to call.   ? ?Functional Questionnaire: (I = Independent and D = Dependent) ?ADLs: has wheelchair and RW for mobility. Independent with personal care.  He  has a right knee brace but says it is too painful to wear it. He also has a BSC ? ? ?Follow up appointments reviewed: ? ?PCP Hospital f/u appt confirmed? Yes  Scheduled to see Daniel Nam, NP -09/03/2021.  ?[facility] f/u appt confirmed? Yes  Scheduled to see [facility] - 08/20/2021.  Provided him with the phone number to call Dr Erlinda Hong to schedule his follow up appointment . ?Are transportation arrangements needed?  He said that he has a car but his not able to get into it now without assistance.  I gave him the phone number for DSS and instructed him to call to schedule transportation to his appointments. He will need to give DSS a 2-3 day notice of appts.  He was not sure who would transport him to dialysis.  ?If their condition worsens, is the pt aware to call PCP or go to the Emergency Dept.? Yes ?Was the patient provided with contact information for the PCP's office or ED? Yes ?Was to pt encouraged to call back with questions or concerns? Yes ? ?

## 2021-08-15 ENCOUNTER — Other Ambulatory Visit: Payer: Self-pay

## 2021-08-15 DIAGNOSIS — I739 Peripheral vascular disease, unspecified: Secondary | ICD-10-CM

## 2021-08-16 ENCOUNTER — Ambulatory Visit (INDEPENDENT_AMBULATORY_CARE_PROVIDER_SITE_OTHER): Payer: Self-pay

## 2021-08-16 ENCOUNTER — Encounter (HOSPITAL_COMMUNITY): Payer: Self-pay

## 2021-08-16 ENCOUNTER — Inpatient Hospital Stay (HOSPITAL_COMMUNITY)
Admission: EM | Admit: 2021-08-16 | Discharge: 2021-08-19 | DRG: 871 | Disposition: A | Discharge status: Home / Self Care | Payer: Medicare Other | Attending: Internal Medicine | Admitting: Internal Medicine

## 2021-08-16 ENCOUNTER — Emergency Department (HOSPITAL_COMMUNITY): Payer: Medicare Other

## 2021-08-16 ENCOUNTER — Other Ambulatory Visit: Payer: Self-pay

## 2021-08-16 DIAGNOSIS — D649 Anemia, unspecified: Secondary | ICD-10-CM | POA: Diagnosis present

## 2021-08-16 DIAGNOSIS — E1151 Type 2 diabetes mellitus with diabetic peripheral angiopathy without gangrene: Secondary | ICD-10-CM | POA: Diagnosis present

## 2021-08-16 DIAGNOSIS — Z885 Allergy status to narcotic agent status: Secondary | ICD-10-CM

## 2021-08-16 DIAGNOSIS — Y835 Amputation of limb(s) as the cause of abnormal reaction of the patient, or of later complication, without mention of misadventure at the time of the procedure: Secondary | ICD-10-CM | POA: Diagnosis present

## 2021-08-16 DIAGNOSIS — I739 Peripheral vascular disease, unspecified: Secondary | ICD-10-CM

## 2021-08-16 DIAGNOSIS — I10 Essential (primary) hypertension: Secondary | ICD-10-CM | POA: Diagnosis not present

## 2021-08-16 DIAGNOSIS — K2100 Gastro-esophageal reflux disease with esophagitis, without bleeding: Secondary | ICD-10-CM | POA: Diagnosis not present

## 2021-08-16 DIAGNOSIS — T8744 Infection of amputation stump, left lower extremity: Secondary | ICD-10-CM | POA: Diagnosis present

## 2021-08-16 DIAGNOSIS — T8740 Infection of amputation stump, unspecified extremity: Secondary | ICD-10-CM

## 2021-08-16 DIAGNOSIS — M79605 Pain in left leg: Secondary | ICD-10-CM | POA: Diagnosis not present

## 2021-08-16 DIAGNOSIS — C220 Liver cell carcinoma: Secondary | ICD-10-CM | POA: Diagnosis present

## 2021-08-16 DIAGNOSIS — Z888 Allergy status to other drugs, medicaments and biological substances status: Secondary | ICD-10-CM

## 2021-08-16 DIAGNOSIS — N186 End stage renal disease: Secondary | ICD-10-CM | POA: Diagnosis present

## 2021-08-16 DIAGNOSIS — Z7982 Long term (current) use of aspirin: Secondary | ICD-10-CM | POA: Diagnosis not present

## 2021-08-16 DIAGNOSIS — S82002A Unspecified fracture of left patella, initial encounter for closed fracture: Secondary | ICD-10-CM | POA: Diagnosis present

## 2021-08-16 DIAGNOSIS — W19XXXA Unspecified fall, initial encounter: Secondary | ICD-10-CM | POA: Diagnosis present

## 2021-08-16 DIAGNOSIS — F17210 Nicotine dependence, cigarettes, uncomplicated: Secondary | ICD-10-CM | POA: Diagnosis present

## 2021-08-16 DIAGNOSIS — Z79899 Other long term (current) drug therapy: Secondary | ICD-10-CM | POA: Diagnosis not present

## 2021-08-16 DIAGNOSIS — B182 Chronic viral hepatitis C: Secondary | ICD-10-CM | POA: Diagnosis present

## 2021-08-16 DIAGNOSIS — E1169 Type 2 diabetes mellitus with other specified complication: Secondary | ICD-10-CM | POA: Diagnosis not present

## 2021-08-16 DIAGNOSIS — Z992 Dependence on renal dialysis: Secondary | ICD-10-CM | POA: Diagnosis not present

## 2021-08-16 DIAGNOSIS — B1920 Unspecified viral hepatitis C without hepatic coma: Secondary | ICD-10-CM | POA: Diagnosis present

## 2021-08-16 DIAGNOSIS — I132 Hypertensive heart and chronic kidney disease with heart failure and with stage 5 chronic kidney disease, or end stage renal disease: Secondary | ICD-10-CM | POA: Diagnosis present

## 2021-08-16 DIAGNOSIS — Z20822 Contact with and (suspected) exposure to covid-19: Secondary | ICD-10-CM | POA: Diagnosis present

## 2021-08-16 DIAGNOSIS — K219 Gastro-esophageal reflux disease without esophagitis: Secondary | ICD-10-CM | POA: Diagnosis present

## 2021-08-16 DIAGNOSIS — R918 Other nonspecific abnormal finding of lung field: Secondary | ICD-10-CM | POA: Diagnosis present

## 2021-08-16 DIAGNOSIS — K922 Gastrointestinal hemorrhage, unspecified: Secondary | ICD-10-CM | POA: Diagnosis present

## 2021-08-16 DIAGNOSIS — E119 Type 2 diabetes mellitus without complications: Secondary | ICD-10-CM

## 2021-08-16 DIAGNOSIS — E1122 Type 2 diabetes mellitus with diabetic chronic kidney disease: Secondary | ICD-10-CM | POA: Diagnosis present

## 2021-08-16 DIAGNOSIS — A419 Sepsis, unspecified organism: Secondary | ICD-10-CM | POA: Diagnosis present

## 2021-08-16 LAB — CBC WITH DIFFERENTIAL/PLATELET
Abs Immature Granulocytes: 0.02 10*3/uL (ref 0.00–0.07)
Basophils Absolute: 0 10*3/uL (ref 0.0–0.1)
Basophils Relative: 0 %
Eosinophils Absolute: 0.2 10*3/uL (ref 0.0–0.5)
Eosinophils Relative: 3 %
HCT: 20.8 % — ABNORMAL LOW (ref 39.0–52.0)
Hemoglobin: 6.7 g/dL — CL (ref 13.0–17.0)
Immature Granulocytes: 0 %
Lymphocytes Relative: 21 %
Lymphs Abs: 1.3 10*3/uL (ref 0.7–4.0)
MCH: 33.2 pg (ref 26.0–34.0)
MCHC: 32.2 g/dL (ref 30.0–36.0)
MCV: 103 fL — ABNORMAL HIGH (ref 80.0–100.0)
Monocytes Absolute: 0.9 10*3/uL (ref 0.1–1.0)
Monocytes Relative: 14 %
Neutro Abs: 3.8 10*3/uL (ref 1.7–7.7)
Neutrophils Relative %: 62 %
Platelets: 159 10*3/uL (ref 150–400)
RBC: 2.02 MIL/uL — ABNORMAL LOW (ref 4.22–5.81)
RDW: 20.7 % — ABNORMAL HIGH (ref 11.5–15.5)
WBC: 6.2 10*3/uL (ref 4.0–10.5)
nRBC: 0 % (ref 0.0–0.2)

## 2021-08-16 LAB — COMPREHENSIVE METABOLIC PANEL
ALT: 15 U/L (ref 0–44)
AST: 38 U/L (ref 15–41)
Albumin: 2.3 g/dL — ABNORMAL LOW (ref 3.5–5.0)
Alkaline Phosphatase: 77 U/L (ref 38–126)
Anion gap: 12 (ref 5–15)
BUN: 50 mg/dL — ABNORMAL HIGH (ref 8–23)
CO2: 24 mmol/L (ref 22–32)
Calcium: 8 mg/dL — ABNORMAL LOW (ref 8.9–10.3)
Chloride: 101 mmol/L (ref 98–111)
Creatinine, Ser: 9.68 mg/dL — ABNORMAL HIGH (ref 0.61–1.24)
GFR, Estimated: 6 mL/min — ABNORMAL LOW (ref >60)
Glucose, Bld: 168 mg/dL — ABNORMAL HIGH (ref 70–99)
Potassium: 4.5 mmol/L (ref 3.5–5.1)
Sodium: 137 mmol/L (ref 135–145)
Total Bilirubin: 0.9 mg/dL (ref 0.3–1.2)
Total Protein: 7.1 g/dL (ref 6.5–8.1)

## 2021-08-16 LAB — PREPARE RBC (CROSSMATCH)

## 2021-08-16 LAB — LACTIC ACID, PLASMA: Lactic Acid, Venous: 1.8 mmol/L (ref 0.5–1.9)

## 2021-08-16 MED ORDER — AMLODIPINE BESYLATE 2.5 MG PO TABS
2.5000 mg | ORAL_TABLET | Freq: Every day | ORAL | Status: DC
  Administered 2021-08-17 – 2021-08-18 (×3): 2.5 mg via ORAL
  Filled 2021-08-16 (×3): qty 1

## 2021-08-16 MED ORDER — SODIUM CHLORIDE 0.9 % IV SOLN
2.0000 g | INTRAVENOUS | Status: DC
  Administered 2021-08-17 – 2021-08-18 (×2): 2 g via INTRAVENOUS
  Filled 2021-08-16 (×2): qty 20

## 2021-08-16 MED ORDER — SODIUM CHLORIDE 0.9 % IV SOLN
2.0000 g | Freq: Once | INTRAVENOUS | Status: AC
  Administered 2021-08-16: 2 g via INTRAVENOUS
  Filled 2021-08-16: qty 20

## 2021-08-16 MED ORDER — MORPHINE SULFATE (PF) 4 MG/ML IV SOLN
4.0000 mg | Freq: Once | INTRAVENOUS | Status: AC
Start: 2021-08-16 — End: 2021-08-16
  Administered 2021-08-16: 4 mg via INTRAVENOUS
  Filled 2021-08-16: qty 1

## 2021-08-16 MED ORDER — VANCOMYCIN HCL IN DEXTROSE 1-5 GM/200ML-% IV SOLN: 1000.0000 mg | Freq: Once | INTRAVENOUS | Status: DC

## 2021-08-16 MED ORDER — ATORVASTATIN CALCIUM 40 MG PO TABS
40.0000 mg | ORAL_TABLET | Freq: Every day | ORAL | Status: DC
  Administered 2021-08-17 – 2021-08-19 (×3): 40 mg via ORAL
  Filled 2021-08-16 (×3): qty 1

## 2021-08-16 MED ORDER — HYDROCODONE-ACETAMINOPHEN 5-325 MG PO TABS
1.0000 | ORAL_TABLET | Freq: Four times a day (QID) | ORAL | Status: DC | PRN
  Administered 2021-08-17 – 2021-08-19 (×7): 1 via ORAL
  Filled 2021-08-16 (×7): qty 1

## 2021-08-16 MED ORDER — VANCOMYCIN HCL IN DEXTROSE 1-5 GM/200ML-% IV SOLN
1000.0000 mg | INTRAVENOUS | Status: DC
  Administered 2021-08-19: 1000 mg via INTRAVENOUS
  Filled 2021-08-16 (×2): qty 200

## 2021-08-16 MED ORDER — FUROSEMIDE 10 MG/ML IJ SOLN
40.0000 mg | Freq: Once | INTRAMUSCULAR | Status: AC
  Administered 2021-08-17: 40 mg via INTRAVENOUS
  Filled 2021-08-16: qty 4

## 2021-08-16 MED ORDER — INSULIN ASPART 100 UNIT/ML IJ SOLN: 0.0000 [IU] | Freq: Three times a day (TID) | INTRAMUSCULAR | Status: DC

## 2021-08-16 MED ORDER — ASPIRIN EC 81 MG PO TBEC
81.0000 mg | DELAYED_RELEASE_TABLET | Freq: Every day | ORAL | Status: DC
  Administered 2021-08-17 – 2021-08-19 (×3): 81 mg via ORAL
  Filled 2021-08-16 (×3): qty 1

## 2021-08-16 MED ORDER — VANCOMYCIN HCL 2000 MG/400ML IV SOLN
2000.0000 mg | Freq: Once | INTRAVENOUS | Status: AC
  Administered 2021-08-16: 2000 mg via INTRAVENOUS
  Filled 2021-08-16: qty 400

## 2021-08-16 MED ORDER — SODIUM CHLORIDE 0.9% FLUSH
3.0000 mL | Freq: Two times a day (BID) | INTRAVENOUS | Status: DC
  Administered 2021-08-17 – 2021-08-18 (×3): 3 mL via INTRAVENOUS

## 2021-08-16 MED ORDER — ACETAMINOPHEN 325 MG PO TABS
650.0000 mg | ORAL_TABLET | Freq: Once | ORAL | Status: AC
  Administered 2021-08-16: 650 mg via ORAL
  Filled 2021-08-16: qty 2

## 2021-08-16 MED ORDER — ACETAMINOPHEN 325 MG PO TABS
650.0000 mg | ORAL_TABLET | Freq: Four times a day (QID) | ORAL | Status: DC | PRN
Start: 2021-08-16 — End: 2021-08-19
  Administered 2021-08-17: 650 mg via ORAL
  Filled 2021-08-16: qty 2

## 2021-08-16 MED ORDER — SODIUM CHLORIDE 0.9 % IV SOLN
10.0000 mL/h | Freq: Once | INTRAVENOUS | Status: AC
  Administered 2021-08-17: 10 mL/h via INTRAVENOUS

## 2021-08-16 MED ORDER — PANTOPRAZOLE SODIUM 40 MG PO TBEC
40.0000 mg | DELAYED_RELEASE_TABLET | Freq: Every day | ORAL | Status: DC
  Administered 2021-08-17 – 2021-08-19 (×3): 40 mg via ORAL
  Filled 2021-08-16 (×3): qty 1

## 2021-08-16 MED ORDER — ACETAMINOPHEN 650 MG RE SUPP: 650.0000 mg | Freq: Four times a day (QID) | RECTAL | Status: DC | PRN

## 2021-08-16 NOTE — Sepsis Progress Note (Signed)
Sepsis protocol monitored by eLink 

## 2021-08-16 NOTE — Progress Notes (Signed)
Pharmacy Antibiotic Note ? ?Daniel [name] is a 61 y.o. male admitted on 08/16/2021 with cellulitis.  Pharmacy has been consulted for vancomycin dosing. Of note, patient has ESRD and is on dialysis MWF.  ? ?WBC 6.2, fever of 100.89F on presentation. Will give loading dose of ~20 mg/kg. Maintenance dosing will be based on hemodialysis schedule. ? ?Plan: ?Ceftriaxone per MD ?Vancomycin 2000 mg IV x1 followed by vancomycin 1000 mg IV every MWF after dialysis  ?Vancomycin levels as indicated ?F/u clinical course, fever curve, cultures ? ?Height: '5\' 11"'$  (180.3 cm) ?Weight: 115 kg (253 lb 8.5 oz) ?IBW/kg (Calculated) : 75.3 ? ?Temp (24hrs), Avg:99.6 ?F (37.6 ?C), Min:99.6 ?F (37.6 ?C), Max:99.6 ?F (37.6 ?C) ? ?Recent Labs  ?Lab 08/10/21 ?0202 08/12/21 ?0845  ?WBC 4.6  --   ?CREATININE  --  9.00*  ?  ?Estimated Creatinine Clearance: 11.1 mL/min (A) (by C-G formula based on SCr of 9 mg/dL (H)).   ? ?Allergies  ?Allergen Reactions  ? Seroquel [Quetiapine] Other (See Comments)  ?  Tardive kinesia/dystonia  ? Dilaudid [Hydromorphone Hcl] Itching and Other (See Comments)  ?  Pt reports itchiness after IM injection   ? ? ?Antimicrobials this admission: ?Ceftriaxone 4/21 >>  ?Vancomycin 4/21 >>  ? ?Dose adjustments this admission: ?N/A ? ?Microbiology results: ?4/21 BCx: in process ? ? ?Thank you for allowing pharmacy to be a part of this patient?s care. ? ?Zenaida Deed, PharmD ?PGY1 Acute Care Pharmacy Resident  ?Phone: [phone] ?08/16/2021  11:49 PM ? ?Please check [email or web address] for unit-specific pharmacy phone numbers. ? ? ?

## 2021-08-16 NOTE — Telephone Encounter (Signed)
Spoke with [name]. Verbals given. Informed david that Rx for patients iron pills was sent to pharmacy on 08/10/21. He will discuss with patient at next visit.  ?

## 2021-08-16 NOTE — ED Notes (Signed)
Date and time results received: 08/16/21  ? ? ?Test: hg ?Critical Value: 6.7 ? ?Name of Provider Notified: Darl Householder ? ? ?

## 2021-08-16 NOTE — ED Triage Notes (Signed)
Pt BIB EMS from home c/o L leg pain. Patient reports that the pain started today. Patient was recently seen last week for a fall and was found to have a fracture. Patient did have an amputation on the affected leg 4 months ago.  ? ?VSS w/ EMS ?

## 2021-08-16 NOTE — ED Provider Notes (Signed)
?[place] ?Provider Note ? ? ?CSN: [id number] ?Arrival date & time: 08/16/21  2022 ? ?  ? ?History ? ?Chief Complaint  ?Patient presents with  ? Leg Pain  ? ? ?Daniel [name] is a 61 y.o. male history of ESRD on dialysis, left  below the knee amputation done by vascular surgeon (Dr. Scot Dock on 04/16/21), here presenting with left knee pain and swelling.  Patient was seen here a week ago for left knee pain.  He had an x-ray that showed patella fracture.  Patient was given pain medicine and was discharged home with Ortho follow up. Patient has worsening L knee pain and swelling. Denies any trauma or injury. Denies any purulent discharge  ? ? ?The history is provided by the patient.  ? ?  ? ?Home Medications ?Prior to Admission medications   ?Medication Sig Start Date End Date Taking? Authorizing Provider  ?amLODipine (NORVASC) 2.5 MG tablet Take 1 tablet (2.5 mg total) by mouth at bedtime. 08/10/21   Bonnielee Haff, MD  ?aspirin 81 MG EC tablet Take 1 tablet (81 mg total) by mouth daily. Swallow whole. 08/10/21   Bonnielee Haff, MD  ?atorvastatin (LIPITOR) 40 MG tablet Take 1 tablet (40 mg total) by mouth daily. 08/10/21   Bonnielee Haff, MD  ?blood glucose meter kit and supplies KIT Dispense based on patient and insurance preference. Use up to four times daily as directed. (FOR ICD-9 250.00, 250.01). 07/01/16   Nita Sells, MD  ?Calcium Acetate 667 MG TABS Take 1 tablet by mouth in the morning, at noon, and at bedtime. 08/10/21   Bonnielee Haff, MD  ?ferrous sulfate 325 (65 FE) MG tablet Take 1 tablet (325 mg total) by mouth every morning. 08/10/21   Bonnielee Haff, MD  ?fluconazole (DIFLUCAN) 100 MG tablet Take 1 tablet (100 mg total) by mouth daily. 08/12/21   [name], MD  ?gabapentin (NEURONTIN) 100 MG capsule Take 1 capsule (100 mg total) by mouth 3 (three) times daily. 08/10/21   Bonnielee Haff, MD  ?HYDROcodone-acetaminophen (NORCO/VICODIN) 5-325 MG  tablet Take 1 tablet by mouth every 6 (six) hours as needed for severe pain. 08/10/21   Bonnielee Haff, MD  ?hydrOXYzine (ATARAX) 25 MG tablet Take 1 tablet (25 mg total) by mouth every 8 (eight) hours as needed for itching. 08/10/21   Bonnielee Haff, MD  ?loperamide (IMODIUM A-D) 2 MG tablet Take 4 mg by mouth in the morning, at noon, and at bedtime.    [provider]  ?Multiple Vitamin (MULTIVITAMIN WITH MINERALS) TABS tablet Take 1 tablet by mouth every morning.    [provider]  ?pantoprazole (PROTONIX) 40 MG tablet Take 1 tablet (40 mg total) by mouth daily. 08/10/21   Bonnielee Haff, MD  ?saccharomyces boulardii (FLORASTOR) 250 MG capsule Take 1 capsule (250 mg total) by mouth 2 (two) times daily. 04/23/21   Bonnielee Haff, MD  ?colchicine 0.6 MG tablet Take 0.5 tablets (0.3 mg total) by mouth 2 (two) times daily. 07/24/20 07/29/20  Noemi Chapel, MD  ?furosemide (LASIX) 40 MG tablet Take 1 tablet (40 mg total) by mouth daily. 07/25/19 02/09/20  Ladona Horns, MD  ?   ? ?Allergies    ?Seroquel [quetiapine] and Dilaudid [hydromorphone hcl]   ? ?Review of Systems   ?Review of Systems  ?Musculoskeletal:   ?     Left knee pain and swelling  ?Skin:  Positive for wound.  ?All other systems reviewed and are negative. ? ?Physical Exam ?  Updated Vital Signs ?BP (!) 198/80 (BP Location: Right Arm)   Pulse (!) 104   Temp 99.6 ?F (37.6 ?C) (Oral)   Ht 5' 11"  (1.803 m)   Wt 115 kg   SpO2 99%   BMI 35.36 kg/m?  ?Physical Exam ?Vitals and nursing note reviewed.  ?Constitutional:   ?   Comments: Chronically ill-appearing, uncomfortable  ?HENT:  ?   Head: Normocephalic.  ?   Nose: Nose normal.  ?   Mouth/Throat:  ?   Mouth: Mucous membranes are moist.  ?Eyes:  ?   Extraocular Movements: Extraocular movements intact.  ?   Pupils: Pupils are equal, round, and reactive to light.  ?Cardiovascular:  ?   Rate and Rhythm: Normal rate and regular rhythm.  ?   Pulses: Normal pulses.  ?   Heart sounds: Normal heart  sounds.  ?Pulmonary:  ?   Effort: Pulmonary effort is normal.  ?   Breath sounds: Normal breath sounds.  ?Abdominal:  ?   General: Abdomen is flat.  ?   Palpations: Abdomen is soft.  ?Musculoskeletal:  ?   Cervical back: Normal range of motion and neck supple.  ?   Comments: Left knee swollen and red erythematous.  Patient's stump is also erythematous and the whole area is very warm and tender.  ?Skin: ?   Capillary Refill: Capillary refill takes less than 2 seconds.  ?   Findings: Erythema present.  ?Neurological:  ?   General: No focal deficit present.  ?   Mental Status: He is alert and oriented to person, place, and time.  ?Psychiatric:     ?   Mood and Affect: Mood normal.     ?   Behavior: Behavior normal.  ? ? ? ?ED Results / Procedures / Treatments   ?Labs ?(all labs ordered are listed, but only abnormal results are displayed) ?Labs Reviewed  ?RESP PANEL BY RT-PCR (FLU A&B, COVID) [id number]  ?CULTURE, BLOOD (ROUTINE X 2)  ?CULTURE, BLOOD (ROUTINE X 2)  ?LACTIC ACID, PLASMA  ?LACTIC ACID, PLASMA  ?COMPREHENSIVE METABOLIC PANEL  ?CBC WITH DIFFERENTIAL/PLATELET  ? ? ?EKG ?EKG Interpretation ? ?Date/Time:  Friday August 16 2021 20:37:14 EDT ?Ventricular Rate:  104 ?PR Interval:  174 ?QRS Duration: 84 ?QT Interval:  366 ?QTC Calculation: 482 ?R Axis:   -18 ?Text Interpretation: Sinus tachycardia Inferior infarct, old Anterior infarct, old No significant change since last tracing Confirmed by Wandra Arthurs [phone]) on 08/16/2021 8:43:17 PM ? ?Radiology ?No results found. ? ?Procedures ?Procedures  ? ? ?CRITICAL CARE ?Performed by: Wandra Arthurs ? ? ?Total critical care time: 30 minutes ? ?Critical care time was exclusive of separately billable procedures and treating other patients. ? ?Critical care was necessary to treat or prevent imminent or life-threatening deterioration. ? ?Critical care was time spent personally by me on the following activities: development of treatment plan with patient and/or surrogate as well as  nursing, discussions with consultants, evaluation of patient's response to treatment, examination of patient, obtaining history from patient or surrogate, ordering and performing treatments and interventions, ordering and review of laboratory studies, ordering and review of radiographic studies, pulse oximetry and re-evaluation of patient's condition. ? ?Angiocath insertion ?Performed by: Wandra Arthurs ? ?Consent: Verbal consent obtained. ?Risks and benefits: risks, benefits and alternatives were discussed ?Time out: Immediately prior to procedure a "time out" was called to verify the correct patient, procedure, equipment, support staff and site/side marked as required. ? ?Preparation: Patient was prepped and  draped in the usual sterile fashion. ? ?Vein Location: R forearm ? ?Ultrasound Guided ? ?Gauge: 20 long  ? ?Normal blood return and flush without difficulty ?Patient tolerance: Patient tolerated the procedure well with no immediate complications. ? ? ? ? ?Medications Ordered in ED ?Medications  ?cefTRIAXone (ROCEPHIN) 2 g in sodium chloride 0.9 % 100 mL IVPB (2 g Intravenous New Bag/Given 08/16/21 2043)  ?vancomycin (VANCOREADY) IVPB 2000 mg/400 mL (has no administration in time range)  ?morphine (PF) 4 MG/ML injection 4 mg (has no administration in time range)  ?acetaminophen (TYLENOL) tablet 650 mg (650 mg Oral Given 08/16/21 2045)  ? ? ?ED Course/ Medical Decision Making/ A&P ?  ?                        ?Medical Decision Making ?Daniel [name] is a 61 y.o. male here presenting with worsening left knee pain and swelling.  Clinically he has cellulitis.  I am concerned for possible osteomyelitis or joint infection.  Patient is a dialysis patient and just finished dialysis.  Patient also had a patella fracture recently.  We will do sepsis work-up with CBC and CMP and lactate and cultures and will get left knee and tib-fib x-ray ? ?9:39 PM ?X-ray did not show any osteomyelitis.  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  Patient's white blood cell count is normal but hemoglobin is low at 6.7.  I ordered 1 unit PRBC.  I think anemia may be from the hemarthrosis

## 2021-08-16 NOTE — Telephone Encounter (Signed)
FYI

## 2021-08-16 NOTE — Telephone Encounter (Signed)
Timmothy Sours, OT was calling to report that pt has not been able to go to Dialysis since coming home from hospital because he doesn't have assistance. He spoke with pt and pt can arrange for someone to help him get in the car to get to Dialysis but no way to get out. I advised him that if pt can call dialysis center when he arrives someone could come out to assist him. Pt states that he did call and they told him they are unable to help him get out. Timmothy Sours, OT states he was going to call dialysis to see why this is so since is needing dialysis and doing the best that he can. He states he will call back if needs any further assistance. I advised him that I will pass this along to provider as well.  ?

## 2021-08-16 NOTE — H&P (Signed)
?History and Physical  ? ?Daniel [name] TIR:[id number] DOB: 10-18-1960 DOA: 08/16/2021 ? ?PCP: [name], NP  ? ?Patient coming from: Home ? ?Chief Complaint: Leg Pain ? ?HPI: Daniel [name] is a 61 y.o. male with medical history significant of ESRD on HD, chronic hepatitis C, PAD, anemia, GI bleed, hypertension, diabetes, GERD, CHF, status post left BKA, lung mass, concern for [facility] presenting with leg pain. ? ?Patient has had ongoing leg pain since having a fall a week ago.  He was seen for this a week ago and noted to have patellar fracture with plan for outpatient Ortho follow-up.  He has had continued pain and swelling which have actually been worsening since that time.  No purulence noted. ? ?He denies fevers, chills, chest pain, shortness of breath, abdominal pain, constipation, diarrhea, nausea, vomiting. ? ?ED Course: Vital signs in the ED significant for fever to 100.6, respiratory rate in the teens to 20s, heart rate in the 90s to 100s, blood pressure in the 676P to 950D systolic.  Lab work-up included CMP with BUN 50 and creatinine 9.68 consistent with ESRD, glucose 168, calcium 8, albumin 2.3.  CBC with hemoglobin of 6.7 down from 9.2 on discharge.  Lactic acid normal with repeat pending.  Respiratory panel for flu and COVID pending.  Patient has been typed and screened in the ED.  Blood cultures pending.  Chest x-ray showing cardiomegaly vascular congestion and mild edema.  Left knee x-ray showing patella fracture decreased joint effusion and status post BKA.  Patient received vancomycin and ceftriaxone in the ED as well as a dose of morphine and Tylenol.  Vascular surgery was consulted and stated they will see the patient and consider an AKA.  Patient would like to avoid additional amputation and has asked for second opinion.  Orthopedics was consulted and will see the patient.  Nephrology also consulted for dialysis in the ED. ? ?Review of Systems: As per HPI otherwise all other  systems reviewed and are negative. ? ?Past Medical History:  ?Diagnosis Date  ? Anemia   ? Diabetes mellitus without complication ([place])   ? patient denies  ? Dialysis patient [facility])   ? End stage chronic kidney disease ([place])   ? Hypertension   ? ICH (intracerebral hemorrhage) ([place]) 05/20/2017  ? PAD (peripheral artery disease) ([place])   ? Shoulder pain, left 06/28/2013  ? ? ?Past Surgical History:  ?Procedure Laterality Date  ? A/V FISTULAGRAM N/A 08/15/2020  ? Procedure: A/V FISTULAGRAM - Left Upper;  Surgeon: Cherre Robins, MD;  Location: [place] CV LAB;  Service: Cardiovascular;  Laterality: N/A;  ? ABDOMINAL AORTOGRAM W/LOWER EXTREMITY N/A 04/08/2021  ? Procedure: ABDOMINAL AORTOGRAM W/LOWER EXTREMITY;  Surgeon: Waynetta Sandy, MD;  Location: [place] CV LAB;  Service: Cardiovascular;  Laterality: N/A;  ? AMPUTATION Left 04/16/2021  ? Procedure: LEFT BELOW KNEE AMPUTATION;  Surgeon: Angelia Mould, MD;  Location: [facility] OR;  Service: Vascular;  Laterality: Left;  ? AMPUTATION Left 06/17/2021  ? Procedure: REVISION AMPUTATION BELOW KNEE;  Surgeon: Broadus John, MD;  Location: [place];  Service: Vascular;  Laterality: Left;  ? APPENDECTOMY    ? APPLICATION OF WOUND VAC Left 06/17/2021  ? Procedure: APPLICATION OF WOUND VAC;  Surgeon: Broadus John, MD;  Location: [place];  Service: Vascular;  Laterality: Left;  ? AV FISTULA PLACEMENT Left 08/03/2019  ? Procedure: LEFT ARM ARTERIOVENOUS (AV) CEPHALIC  FISTULA CREATION;  Surgeon: Waynetta Sandy, MD;  Location:  [facility] OR;  Service: Vascular;  Laterality: Left;  ? BIOPSY  06/30/2019  ? Procedure: BIOPSY;  Surgeon: Ronald Lobo, MD;  Location: [place];  Service: Endoscopy;;  ? BIOPSY  08/02/2019  ? Procedure: BIOPSY;  Surgeon: Yetta Flock, MD;  Location: [facility] ENDOSCOPY;  Service: Gastroenterology;;  ? BIOPSY  02/08/2021  ? Procedure: BIOPSY;  Surgeon: Sharyn Creamer, MD;  Location: [facility] ENDOSCOPY;  Service: Gastroenterology;;  ?  BIOPSY  02/24/2021  ? Procedure: BIOPSY;  Surgeon: [name], MD;  Location: [facility] ENDOSCOPY;  Service: Gastroenterology;;  ? BIOPSY  03/26/2021  ? Procedure: BIOPSY;  Surgeon: Lavena Bullion, DO;  Location: [place] ENDOSCOPY;  Service: Gastroenterology;;  ? BIOPSY  08/06/2021  ? Procedure: BIOPSY;  Surgeon: [name], MD;  Location: [place];  Service: Gastroenterology;;  ? COLONOSCOPY  01/23/2012  ? Procedure: COLONOSCOPY;  Surgeon: Danie Binder, MD;  [facility]: [facility];  Service: Endoscopy;  Laterality: N/A;  11:10 AM  ? COLONOSCOPY WITH PROPOFOL N/A 06/30/2019  ? Procedure: COLONOSCOPY WITH PROPOFOL;  Surgeon: Ronald Lobo, MD;  Location: [place];  Service: Endoscopy;  Laterality: N/A;  ? ENTEROSCOPY N/A 08/02/2019  ? Procedure: ENTEROSCOPY;  Surgeon: Yetta Flock, MD;  Location: [facility] ENDOSCOPY;  Service: Gastroenterology;  Laterality: N/A;  ? ENTEROSCOPY N/A 02/24/2021  ? Procedure: ENTEROSCOPY;  Surgeon: [name], MD;  Location: [place];  Service: Gastroenterology;  Laterality: N/A;  ? ENTEROSCOPY N/A 03/26/2021  ? Procedure: ENTEROSCOPY;  Surgeon: Lavena Bullion, DO;  Location: [place];  Service: Gastroenterology;  Laterality: N/A;  ? ENTEROSCOPY N/A 07/05/2021  ? Procedure: ENTEROSCOPY;  Surgeon: Carol Ada, MD;  Location: [place];  Service: Gastroenterology;  Laterality: N/A;  ? ENTEROSCOPY N/A 08/06/2021  ? Procedure: ENTEROSCOPY;  Surgeon: [name], MD;  Location: [place];  Service: Gastroenterology;  Laterality: N/A;  ? ESOPHAGOGASTRODUODENOSCOPY N/A 08/10/2020  ? Procedure: ESOPHAGOGASTRODUODENOSCOPY (EGD);  Surgeon: Jerene Bears, MD;  Location: [facility] ENDOSCOPY;  Service: Gastroenterology;  Laterality: N/A;  ? ESOPHAGOGASTRODUODENOSCOPY (EGD) WITH PROPOFOL N/A 06/30/2019  ? Procedure: ESOPHAGOGASTRODUODENOSCOPY (EGD) WITH PROPOFOL;  Surgeon: Ronald Lobo, MD;  Location: [place];  Service: Endoscopy;  Laterality: N/A;  ?  ESOPHAGOGASTRODUODENOSCOPY (EGD) WITH PROPOFOL N/A 01/12/2021  ? Procedure: ESOPHAGOGASTRODUODENOSCOPY (EGD) WITH PROPOFOL;  Surgeon: Sharyn Creamer, MD;  Location: [place];  Service: Gastroenterology;  Laterality: N/A;  ? ESOPHAGOGASTRODUODENOSCOPY (EGD) WITH PROPOFOL N/A 02/08/2021  ? Procedure: ESOPHAGOGASTRODUODENOSCOPY (EGD) WITH PROPOFOL;  Surgeon: Sharyn Creamer, MD;  Location: [place];  Service: Gastroenterology;  Laterality: N/A;  ? FISTULA SUPERFICIALIZATION Left 10/17/2019  ? Procedure: LEFT UPPER EXTREMITY FISTULA REVISION, SIDE BRANCH LIGATION,  AND SUPERFICIALIZATION;  Surgeon: Marty Heck, MD;  Location: [place];  Service: Vascular;  Laterality: Left;  ? FISTULA SUPERFICIALIZATION Left 04/19/2021  ? Procedure: PLICATION OF ANEURYSM LEFT ARTERIOVENOUS FISTULA;  Surgeon: Angelia Mould, MD;  Location: [place];  Service: Vascular;  Laterality: Left;  ? FLEXIBLE SIGMOIDOSCOPY N/A 07/05/2021  ? Procedure: FLEXIBLE SIGMOIDOSCOPY;  Surgeon: Carol Ada, MD;  Location: [place];  Service: Gastroenterology;  Laterality: N/A;  ? GIVENS CAPSULE STUDY N/A 06/30/2019  ? Procedure: GIVENS CAPSULE STUDY;  Surgeon: Ronald Lobo, MD;  Location: [facility] ENDOSCOPY;  Service: Endoscopy;  Laterality: N/A;  ? HEMOSTASIS CLIP PLACEMENT  08/10/2020  ? Procedure: HEMOSTASIS CLIP PLACEMENT;  Surgeon: Jerene Bears, MD;  Location: [facility] ENDOSCOPY;  Service: Gastroenterology;;  ? HEMOSTASIS CLIP PLACEMENT  01/12/2021  ? Procedure: HEMOSTASIS CLIP PLACEMENT;  Surgeon: Sharyn Creamer, MD;  Location: [place];  Service: Gastroenterology;;  ? HEMOSTASIS CONTROL  08/02/2019  ? Procedure: HEMOSTASIS CONTROL;  Surgeon: Yetta Flock, MD;  Location: [place];  Service: Gastroenterology;;  ? HOT HEMOSTASIS  02/24/2021  ? Procedure: HOT HEMOSTASIS (ARGON PLASMA COAGULATION/BICAP);  Surgeon: [name], MD;  Location: [facility] ENDOSCOPY;  Service: Gastroenterology;;  ? HOT HEMOSTASIS N/A 03/26/2021  ?  Procedure: HOT HEMOSTASIS (ARGON PLASMA COAGULATION/BICAP);  Surgeon: Lavena Bullion, DO;  Location: [facility] ENDOSCOPY;  Service: Gastroenterology;  Laterality: N/A;  ? HOT HEMOSTASIS N/A 07/05/2021  ? Procedure: HOT

## 2021-08-17 DIAGNOSIS — A419 Sepsis, unspecified organism: Principal | ICD-10-CM

## 2021-08-17 DIAGNOSIS — T8740 Infection of amputation stump, unspecified extremity: Secondary | ICD-10-CM

## 2021-08-17 DIAGNOSIS — M79605 Pain in left leg: Secondary | ICD-10-CM

## 2021-08-17 DIAGNOSIS — D649 Anemia, unspecified: Secondary | ICD-10-CM

## 2021-08-17 DIAGNOSIS — E1169 Type 2 diabetes mellitus with other specified complication: Secondary | ICD-10-CM

## 2021-08-17 DIAGNOSIS — Z992 Dependence on renal dialysis: Secondary | ICD-10-CM

## 2021-08-17 DIAGNOSIS — B182 Chronic viral hepatitis C: Secondary | ICD-10-CM

## 2021-08-17 DIAGNOSIS — I10 Essential (primary) hypertension: Secondary | ICD-10-CM

## 2021-08-17 DIAGNOSIS — N186 End stage renal disease: Secondary | ICD-10-CM

## 2021-08-17 LAB — RESP PANEL BY RT-PCR (FLU A&B, COVID) ARPGX2
Influenza A by PCR: NEGATIVE
Influenza B by PCR: NEGATIVE
SARS Coronavirus 2 by RT PCR: NEGATIVE

## 2021-08-17 LAB — CBC
HCT: 24.1 % — ABNORMAL LOW (ref 39.0–52.0)
Hemoglobin: 7.7 g/dL — ABNORMAL LOW (ref 13.0–17.0)
MCH: 32.1 pg (ref 26.0–34.0)
MCHC: 32 g/dL (ref 30.0–36.0)
MCV: 100.4 fL — ABNORMAL HIGH (ref 80.0–100.0)
Platelets: 151 10*3/uL (ref 150–400)
RBC: 2.4 MIL/uL — ABNORMAL LOW (ref 4.22–5.81)
RDW: 21.2 % — ABNORMAL HIGH (ref 11.5–15.5)
WBC: 5.7 10*3/uL (ref 4.0–10.5)
nRBC: 0 % (ref 0.0–0.2)

## 2021-08-17 LAB — COMPREHENSIVE METABOLIC PANEL
ALT: 15 U/L (ref 0–44)
AST: 39 U/L (ref 15–41)
Albumin: 2.2 g/dL — ABNORMAL LOW (ref 3.5–5.0)
Alkaline Phosphatase: 72 U/L (ref 38–126)
Anion gap: 13 (ref 5–15)
BUN: 52 mg/dL — ABNORMAL HIGH (ref 8–23)
CO2: 24 mmol/L (ref 22–32)
Calcium: 8.1 mg/dL — ABNORMAL LOW (ref 8.9–10.3)
Chloride: 99 mmol/L (ref 98–111)
Creatinine, Ser: 9.88 mg/dL — ABNORMAL HIGH (ref 0.61–1.24)
GFR, Estimated: 5 mL/min — ABNORMAL LOW (ref >60)
Glucose, Bld: 89 mg/dL (ref 70–99)
Potassium: 4.9 mmol/L (ref 3.5–5.1)
Sodium: 136 mmol/L (ref 135–145)
Total Bilirubin: 1.1 mg/dL (ref 0.3–1.2)
Total Protein: 7.1 g/dL (ref 6.5–8.1)

## 2021-08-17 LAB — CBG MONITORING, ED
Glucose-Capillary: 78 mg/dL (ref 70–99)
Glucose-Capillary: 91 mg/dL (ref 70–99)

## 2021-08-17 LAB — GLUCOSE, CAPILLARY
Glucose-Capillary: 116 mg/dL — ABNORMAL HIGH (ref 70–99)
Glucose-Capillary: 107 mg/dL — ABNORMAL HIGH (ref 70–99)

## 2021-08-17 LAB — MRSA NEXT GEN BY PCR, NASAL: MRSA by PCR Next Gen: NOT DETECTED

## 2021-08-17 MED ORDER — CHLORHEXIDINE GLUCONATE CLOTH 2 % EX PADS
6.0000 | MEDICATED_PAD | Freq: Every day | CUTANEOUS | Status: DC
  Administered 2021-08-18 – 2021-08-19 (×2): 6 via TOPICAL

## 2021-08-17 NOTE — ED Notes (Signed)
Pt alert, NAD, calm, interactive, eating breakfast. ?

## 2021-08-17 NOTE — Progress Notes (Signed)
Saw this patient in his room.  Had partial HD yesterday. CXR borderline congestion, no SOB and clear lungs, no O2. Labs okay. Does not need HD or consultation today. Gets HD MWF. Will reassess tomorrow.  ? ?Kelly Splinter, MD ?08/17/2021, 7:41 PM ? ? ? ? ?

## 2021-08-17 NOTE — ED Notes (Signed)
Breakfast order placed ?

## 2021-08-17 NOTE — Consult Note (Signed)
Orthopaedic Trauma Service (OTS) Consult  ? ?Patient ID: ?Daniel [name] ?MRN: [id number] ?DOB/AGE: [id number] y.o. ? ?Reason for Consult:Left patella fracture ?Referring Physician: Dr. Shirlyn Goltz, MD [facility] ? ?HPI: Daniel [name] is an 61 y.o. male who is being seen in consultation the request of Dr. Darl Householder for evaluation of left patella fracture.  The patient has a history of end-stage renal disease on dialysis.  He has a left below-knee amputation done by Dr. Doren Custard in December 2022.  He fell had a left patella fracture that was seen in the emergency room last week.  He never follow-up with an orthopedic provider and returns today for worsening knee pain and swelling.  I was consulted for evaluation for his patella fracture. ? ?Patient was seen and evaluated in the emergency room.  Currently comfortable but does have some pain in his knee.  He is able to actively extend his stump.  He does have some questionable dehiscence at his surgical wound.  He states that he has been wearing his stump shrinker to assist with the pain.  He has not been fitted for a prosthesis yet.  He is concerned that he will have to have an above-knee amputation. ? ?Past Medical History:  ?Diagnosis Date  ? Anemia   ? Diabetes mellitus without complication ([place])   ? patient denies  ? Dialysis patient [facility])   ? End stage chronic kidney disease ([place])   ? Hypertension   ? ICH (intracerebral hemorrhage) ([place]) 05/20/2017  ? PAD (peripheral artery disease) ([place])   ? Shoulder pain, left 06/28/2013  ? ? ?Past Surgical History:  ?Procedure Laterality Date  ? A/V FISTULAGRAM N/A 08/15/2020  ? Procedure: A/V FISTULAGRAM - Left Upper;  Surgeon: Cherre Robins, MD;  Location: [place] [facility];  Service: Cardiovascular;  Laterality: N/A;  ? ABDOMINAL AORTOGRAM W/LOWER EXTREMITY N/A 04/08/2021  ? Procedure: ABDOMINAL AORTOGRAM W/LOWER EXTREMITY;  Surgeon: Waynetta Sandy, MD;  Location: [place] [facility];  Service:  Cardiovascular;  Laterality: N/A;  ? AMPUTATION Left 04/16/2021  ? Procedure: LEFT BELOW KNEE AMPUTATION;  Surgeon: Angelia Mould, MD;  Location: [facility] OR;  Service: Vascular;  Laterality: Left;  ? AMPUTATION Left 06/17/2021  ? Procedure: REVISION AMPUTATION BELOW KNEE;  Surgeon: Broadus John, MD;  Location: [place];  Service: Vascular;  Laterality: Left;  ? APPENDECTOMY    ? APPLICATION OF WOUND VAC Left 06/17/2021  ? Procedure: APPLICATION OF WOUND VAC;  Surgeon: Broadus John, MD;  Location: [place];  Service: Vascular;  Laterality: Left;  ? AV FISTULA PLACEMENT Left 08/03/2019  ? Procedure: LEFT ARM ARTERIOVENOUS (AV) CEPHALIC  FISTULA CREATION;  Surgeon: Waynetta Sandy, MD;  Location: [place];  Service: Vascular;  Laterality: Left;  ? BIOPSY  06/30/2019  ? Procedure: BIOPSY;  Surgeon: Ronald Lobo, MD;  Location: [place];  Service: Endoscopy;;  ? BIOPSY  08/02/2019  ? Procedure: BIOPSY;  Surgeon: Yetta Flock, MD;  Location: [facility] ENDOSCOPY;  Service: Gastroenterology;;  ? BIOPSY  02/08/2021  ? Procedure: BIOPSY;  Surgeon: Sharyn Creamer, MD;  Location: [facility] ENDOSCOPY;  Service: Gastroenterology;;  ? BIOPSY  02/24/2021  ? Procedure: BIOPSY;  Surgeon: [name], MD;  Location: [facility] ENDOSCOPY;  Service: Gastroenterology;;  ? BIOPSY  03/26/2021  ? Procedure: BIOPSY;  Surgeon: Lavena Bullion, DO;  Location: [place] ENDOSCOPY;  Service: Gastroenterology;;  ? BIOPSY  08/06/2021  ? Procedure: BIOPSY;  Surgeon: [name], MD;  Location: [facility] ENDOSCOPY;  Service: Gastroenterology;;  ? COLONOSCOPY  01/23/2012  ? Procedure: COLONOSCOPY;  Surgeon: Danie Binder, MD;  [facility]: [facility];  Service: Endoscopy;  Laterality: N/A;  11:10 AM  ? COLONOSCOPY WITH PROPOFOL N/A 06/30/2019  ? Procedure: COLONOSCOPY WITH PROPOFOL;  Surgeon: Ronald Lobo, MD;  Location: [place];  Service: Endoscopy;  Laterality: N/A;  ? ENTEROSCOPY N/A 08/02/2019  ? Procedure: ENTEROSCOPY;  Surgeon:  Yetta Flock, MD;  Location: [facility] ENDOSCOPY;  Service: Gastroenterology;  Laterality: N/A;  ? ENTEROSCOPY N/A 02/24/2021  ? Procedure: ENTEROSCOPY;  Surgeon: [name], MD;  Location: [place];  Service: Gastroenterology;  Laterality: N/A;  ? ENTEROSCOPY N/A 03/26/2021  ? Procedure: ENTEROSCOPY;  Surgeon: Lavena Bullion, DO;  Location: [place];  Service: Gastroenterology;  Laterality: N/A;  ? ENTEROSCOPY N/A 07/05/2021  ? Procedure: ENTEROSCOPY;  Surgeon: Carol Ada, MD;  Location: [place];  Service: Gastroenterology;  Laterality: N/A;  ? ENTEROSCOPY N/A 08/06/2021  ? Procedure: ENTEROSCOPY;  Surgeon: [name], MD;  Location: [place];  Service: Gastroenterology;  Laterality: N/A;  ? ESOPHAGOGASTRODUODENOSCOPY N/A 08/10/2020  ? Procedure: ESOPHAGOGASTRODUODENOSCOPY (EGD);  Surgeon: Jerene Bears, MD;  Location: [facility] ENDOSCOPY;  Service: Gastroenterology;  Laterality: N/A;  ? ESOPHAGOGASTRODUODENOSCOPY (EGD) WITH PROPOFOL N/A 06/30/2019  ? Procedure: ESOPHAGOGASTRODUODENOSCOPY (EGD) WITH PROPOFOL;  Surgeon: Ronald Lobo, MD;  Location: [place];  Service: Endoscopy;  Laterality: N/A;  ? ESOPHAGOGASTRODUODENOSCOPY (EGD) WITH PROPOFOL N/A 01/12/2021  ? Procedure: ESOPHAGOGASTRODUODENOSCOPY (EGD) WITH PROPOFOL;  Surgeon: Sharyn Creamer, MD;  Location: [place];  Service: Gastroenterology;  Laterality: N/A;  ? ESOPHAGOGASTRODUODENOSCOPY (EGD) WITH PROPOFOL N/A 02/08/2021  ? Procedure: ESOPHAGOGASTRODUODENOSCOPY (EGD) WITH PROPOFOL;  Surgeon: Sharyn Creamer, MD;  Location: [place];  Service: Gastroenterology;  Laterality: N/A;  ? FISTULA SUPERFICIALIZATION Left 10/17/2019  ? Procedure: LEFT UPPER EXTREMITY FISTULA REVISION, SIDE BRANCH LIGATION,  AND SUPERFICIALIZATION;  Surgeon: Marty Heck, MD;  Location: [place];  Service: Vascular;  Laterality: Left;  ? FISTULA SUPERFICIALIZATION Left 04/19/2021  ? Procedure: PLICATION OF ANEURYSM LEFT ARTERIOVENOUS  FISTULA;  Surgeon: Angelia Mould, MD;  Location: [place];  Service: Vascular;  Laterality: Left;  ? FLEXIBLE SIGMOIDOSCOPY N/A 07/05/2021  ? Procedure: FLEXIBLE SIGMOIDOSCOPY;  Surgeon: Carol Ada, MD;  Location: [place];  Service: Gastroenterology;  Laterality: N/A;  ? GIVENS CAPSULE STUDY N/A 06/30/2019  ? Procedure: GIVENS CAPSULE STUDY;  Surgeon: Ronald Lobo, MD;  Location: [facility] ENDOSCOPY;  Service: Endoscopy;  Laterality: N/A;  ? HEMOSTASIS CLIP PLACEMENT  08/10/2020  ? Procedure: HEMOSTASIS CLIP PLACEMENT;  Surgeon: Jerene Bears, MD;  Location: [facility] ENDOSCOPY;  Service: Gastroenterology;;  ? HEMOSTASIS CLIP PLACEMENT  01/12/2021  ? Procedure: HEMOSTASIS CLIP PLACEMENT;  Surgeon: Sharyn Creamer, MD;  Location: [place];  Service: Gastroenterology;;  ? HEMOSTASIS CONTROL  08/02/2019  ? Procedure: HEMOSTASIS CONTROL;  Surgeon: Yetta Flock, MD;  Location: [place];  Service: Gastroenterology;;  ? HOT HEMOSTASIS  02/24/2021  ? Procedure: HOT HEMOSTASIS (ARGON PLASMA COAGULATION/BICAP);  Surgeon: [name], MD;  Location: [facility] ENDOSCOPY;  Service: Gastroenterology;;  ? HOT HEMOSTASIS N/A 03/26/2021  ? Procedure: HOT HEMOSTASIS (ARGON PLASMA COAGULATION/BICAP);  Surgeon: Lavena Bullion, DO;  Location: [facility] ENDOSCOPY;  Service: Gastroenterology;  Laterality: N/A;  ? HOT HEMOSTASIS N/A 07/05/2021  ? Procedure: HOT HEMOSTASIS (ARGON PLASMA COAGULATION/BICAP);  Surgeon: Carol Ada, MD;  Location: [place];  Service: Gastroenterology;  Laterality: N/A;  ? INCISION AND DRAINAGE ABSCESS N/A 06/29/2016  ?  Procedure: INCISION AND DRAINAGE ABDOMINAL WALL ABSCESS;  Surgeon: Alphonsa Overall, MD;  Location: [facility];  Service: General;  Laterality: N/A;  ? INSERTION OF DIALYSIS CATHETER Right 08/03/2019  ? Procedure: INSERTION OF DIALYSIS CATHETER;  Surgeon: Waynetta Sandy, MD;  Location: [facility];  Service: Vascular;  Laterality: Right;  ? INSERTION OF DIALYSIS CATHETER Right 10/22/2019  ?  Procedure: INSERTION OF 23CM TUNNELED DIALYSIS CATHETER RIGHT INTERNAL JUGULAR;  Surgeon: Angelia Mould, MD;  Location: [facility] OR;  Service: Vascular;  Laterality: Right;  ? INSERTION OF DIALYSIS CATHETER Right 08/12/2020

## 2021-08-17 NOTE — ED Notes (Addendum)
Ortho into room, at [facility]. Pending Vascular consult.  ?

## 2021-08-17 NOTE — Progress Notes (Signed)
?PROGRESS NOTE ? ? ? ?Daniel [name]  WOE:[id number] DOB: Apr 14, 1961 DOA: 08/16/2021 ?PCP: [name], NP  ?Outpatient Specialists:  ? ? ? ?Brief Narrative:  ?As per H&P done: "Daniel [name] is a 61 y.o. male with medical history significant of ESRD on HD, chronic hepatitis C, PAD, anemia, GI bleed, hypertension, diabetes, GERD, CHF, status post left BKA, lung mass, concern for [facility] presenting with leg pain. ? ?Patient has had ongoing leg pain since having a fall a week ago.  He was seen for this a week ago and noted to have patellar fracture with plan for outpatient Ortho follow-up.  He has had continued pain and swelling which have actually been worsening since that time.  No purulence noted. ? ?He denies fevers, chills, chest pain, shortness of breath, abdominal pain, constipation, diarrhea, nausea, vomiting. ?  ?ED Course: Vital signs in the ED significant for fever to 100.6, respiratory rate in the teens to 20s, heart rate in the 90s to 100s, blood pressure in the 003B to 048G systolic.  Lab work-up included CMP with BUN 50 and creatinine 9.68 consistent with ESRD, glucose 168, calcium 8, albumin 2.3.  CBC with hemoglobin of 6.7 down from 9.2 on discharge.  Lactic acid normal with repeat pending.  Respiratory panel for flu and COVID pending.  Patient has been typed and screened in the ED.  Blood cultures pending.  Chest x-ray showing cardiomegaly vascular congestion and mild edema.  Left knee x-ray showing patella fracture decreased joint effusion and status post BKA.  Patient received vancomycin and ceftriaxone in the ED as well as a dose of morphine and Tylenol.  Vascular surgery was consulted and stated they will see the patient and consider an AKA.  Patient would like to avoid additional amputation and has asked for second opinion.  Orthopedics was consulted and will see the patient.  Nephrology also consulted for dialysis in the ED". ? ?08/17/2021: Patient seen.  Patient is not a particularly  good historian.  Patient tells me that he was volume overloaded prior to presentation.  Patient maintains that he was compliant with hemodialysis.  Patient still makes urine.  Patient has left upper extremity hemodialysis access.  Recent BKA done (about 4 months ago).  Minimal open wound around the stump.  Patient tells me there was some concerns about cellulitis around the stump area.  No fever or chills.  No other constitutional symptoms reported. ? ? ?Assessment & Plan: ?  ?Active Problems: ?  ESRD (end stage renal disease) on dialysis [facility]) ?  Chronic hepatitis C without hepatic coma (HCC) ?  Hepatocellular carcinoma ([place]) ?  Lung mass ?  PAD (peripheral artery disease) ([place]) ?  Essential hypertension ?  DM2 (diabetes mellitus, type 2) ([place]) ?  Anemia ?  GERD (gastroesophageal reflux disease) ?  Sepsis ([place]) ? ? ?Sepsis secondary to cellulitis ?> Patient presenting with worsening leg pain, erythema, warmth in the setting of recent fall and patellar fracture.  Also in the setting of status post left BKA. ?> Knee area has worsened since previous evaluation following fall.  Imaging showed decrease in the joint effusion but outer swelling is increasing and has appearance of cellulitis. ?> Noted to have fever to 100.6 and was tachycardic meeting criteria for sepsis.  Currently no systemic leukocytosis. ?> No IV fluid bolus due to ESRD status. ?> Concern for cellulitis and possible joint infection (though seems less likely with decreased joint effusion.).  No evidence of osteomyelitis on x-ray. ?>  As per ED course, vascular surgery was consulted and will evaluate the patient but a second opinion was also obtained with the orthopedics consult who will evaluate the patient as he would like to avoid amputation for AKA if possible. ?- Appreciate vascular surgery and orthopedic surgery recommendations ?- Monitor on telemetry ?- Continue with ceftriaxone ?- As needed pain control ?- Trend fever curve and white count ?-  Follow-up blood cultures ?08/17/2021: Sepsis physiology has resolved.  Continue to antibiotics.  Patient is currently on IV vancomycin and ceftriaxone. ?  ?Anemia ?> Recent admit for GI bleed.  Now with hemoglobin 6.7 down from 9.2.  Possibly degree of delusional anemia in the setting of volume overload as below however this does not seem to explain the full drop in hemoglobin. ?> Does not report any bloody stools at this point. ?> Transfusion of 1 unit has been ordered in the ED. ?- Check FOBT, if positive will need GI consult as well ?- Follow-up repeat CBC ?- Transfuse hemoglobin less than 7 ?- 40 mg IV Lasix given volume overload, to reduce risk of TACO ?08/17/2021: We deferred today nephrology team.  Patient may benefit from IV iron and ESA. ? ?Volume overload ?CHF ?ESRD ?> Known ESRD patient on HD.  Presenting with evidence of volume overload with vascular congestion, cardiomegaly, mild edema on chest x-ray. ?> Also known history of diastolic CHF last echo in February with EF 55-60% and normal RV function. ?> No significant shortness of breath.  Edema is present. ?> Nephrology has been consulted in the ED for dialysis ?- Appreciate nephrology assistance with this patient ?- Volume management with dialysis ?- We will give a dose of Lasix now as he does make urine and will be receiving a unit of blood ?08/17/2021: Hopefully, this will improve with hemodialysis. ?  ?PAD ?- Continue home aspirin and atorvastatin ?  ?Hypertension ?- Continue home amlodipine ? ?Diabetes ?- SSI ? ?GERD ?- PPI ? ?Lung mass ?Findings consistent with [facility] ?- Noted during previous admission outpatient oncology referral was placed ? ?DVT prophylaxis: SCD ?Code Status: Full code ?Family Communication:  ?Disposition Plan:  ? ? ?Consultants:  ?Nephrology ? ?Procedures:  ? ? ?Antimicrobials:  ?IV ceftriaxone ?IV vancomycin ? ? ?Subjective: ?Reported volume overload. ? ?Objective: ?Vitals:  ? 08/17/21 0730 08/17/21 0745 08/17/21 0800 08/17/21 0820   ?BP: (!) 147/93 140/90 (!) 154/79 (!) 151/86  ?Pulse: 89 90 95 92  ?Resp: (!) '26 19 19 16  '$ ?Temp:      ?TempSrc:      ?SpO2: 94% 99% 98% 98%  ?Weight:      ?Height:      ? ? ?Intake/Output Summary (Last 24 hours) at 08/17/2021 1018 ?Last data filed at 08/17/2021 1749 ?Gross per 24 hour  ?Intake 1336.25 ml  ?Output 475 ml  ?Net 861.25 ml  ? ?Filed Weights  ? 08/16/21 2032  ?Weight: 115 kg  ? ? ?Examination: ? ?General exam: Appears calm and comfortable.  Patient is obese.  Patient is s/p left BKA.  Mild nonhealed area around the stump. ?Respiratory system: Clear to auscultation.  ?Cardiovascular system: S1 & S2 heard. ?Gastrointestinal system: Abdomen is nondistended, soft and nontender. No organomegaly or masses felt. Normal bowel sounds heard. ?Central nervous system: Alert and oriented. No focal neurological deficits. ?Extremities: Patient is status post right BKA.  See above documentation. ? ?Data Reviewed: I have personally reviewed following labs and imaging studies ? ?CBC: ?Recent Labs  ?Lab 08/12/21 ?4496 08/16/21 ?2035 08/17/21 ?7591  ?  WBC  --  6.2 5.7  ?NEUTROABS  --  3.8  --   ?HGB 9.2* 6.7* 7.7*  ?HCT 27.0* 20.8* 24.1*  ?MCV  --  103.0* 100.4*  ?PLT  --  159 151  ? ?Basic Metabolic Panel: ?Recent Labs  ?Lab 08/12/21 ?1610 08/16/21 ?2035 08/17/21 ?9604  ?NA 131* 137 136  ?K 3.5 4.5 4.9  ?CL 97* 101 99  ?CO2  --  24 24  ?GLUCOSE 139* 168* 89  ?BUN 32* 50* 52*  ?CREATININE 9.00* 9.68* 9.88*  ?CALCIUM  --  8.0* 8.1*  ? ?GFR: ?Estimated Creatinine Clearance: 10.1 mL/min (A) (by C-G formula based on SCr of 9.88 mg/dL (H)). ?Liver Function Tests: ?Recent Labs  ?Lab 08/16/21 ?2035 08/17/21 ?5409  ?AST 38 39  ?ALT 15 15  ?ALKPHOS 77 72  ?BILITOT 0.9 1.1  ?PROT 7.1 7.1  ?ALBUMIN 2.3* 2.2*  ? ?No results for input(s): LIPASE, AMYLASE in the last 168 hours. ?No results for input(s): AMMONIA in the last 168 hours. ?Coagulation Profile: ?No results for input(s): INR, PROTIME in the last 168 hours. ?Cardiac Enzymes: ?No  results for input(s): CKTOTAL, CKMB, CKMBINDEX, TROPONINI in the last 168 hours. ?BNP (last 3 results) ?No results for input(s): PROBNP in the last 8760 hours. ?HbA1C: ?No results for input(s): HGBA1C i

## 2021-08-17 NOTE — ED Notes (Signed)
ED TO INPATIENT HANDOFF REPORT ? ?ED Nurse Name and Phone #: Baxter Flattery, RN ? ?S ?Name/Age/Gender ?Daniel [name] ?61 y.o. ?male ?Room/Bed: 041C/041C ? ?Code Status ?  Code Status: Full Code ? ?Home/SNF/Other ?Home ?Patient oriented to: self, place, time, and situation ?Is this baseline? Yes  ? ?Triage Complete: Triage complete  ?Chief Complaint ?Sepsis ([place]) [A41.9] ? ?Triage Note ?Pt BIB EMS from home c/o L leg pain. Patient reports that the pain started today. Patient was recently seen last week for a fall and was found to have a fracture. Patient did have an amputation on the affected leg 4 months ago.  ? ?VSS w/ EMS  ? ?Allergies ?Allergies  ?Allergen Reactions  ? Seroquel [Quetiapine] Other (See Comments)  ?  Tardive kinesia/dystonia  ? Dilaudid [Hydromorphone Hcl] Itching and Other (See Comments)  ?  Pt reports itchiness after IM injection   ? ? ?Level of Care/Admitting Diagnosis ?ED Disposition   ? ? ED Disposition  ?Admit  ? Condition  ?--  ? Comment  ?Hospital Area: [facility] [[id number]] ? Level of Care: Telemetry Medical [104] ? May admit patient to [facility] or [facility] if equivalent level of care is available:: No ? Covid Evaluation: Asymptomatic - no recent exposure (last 10 days) testing not required ? Diagnosis: Sepsis ([place]) [3149702] ? Admitting Physician: Marcelyn Bruins [[id number]] ? Attending Physician: Marcelyn Bruins [[id number]] ? Estimated length of stay: past midnight tomorrow ? Certification:: I certify this patient will need inpatient services for at least 2 midnights ?  ?  ? ?  ? ? ?B ?Medical/Surgery History ?Past Medical History:  ?Diagnosis Date  ? Anemia   ? Diabetes mellitus without complication ([place])   ? patient denies  ? Dialysis patient [facility])   ? End stage chronic kidney disease ([place])   ? Hypertension   ? ICH (intracerebral hemorrhage) ([place]) 05/20/2017  ? PAD (peripheral artery disease) ([place])   ? Shoulder pain, left 06/28/2013  ? ?Past Surgical History:   ?Procedure Laterality Date  ? A/V FISTULAGRAM N/A 08/15/2020  ? Procedure: A/V FISTULAGRAM - Left Upper;  Surgeon: Cherre Robins, MD;  Location: [place] CV LAB;  Service: Cardiovascular;  Laterality: N/A;  ? ABDOMINAL AORTOGRAM W/LOWER EXTREMITY N/A 04/08/2021  ? Procedure: ABDOMINAL AORTOGRAM W/LOWER EXTREMITY;  Surgeon: Waynetta Sandy, MD;  Location: [place] CV LAB;  Service: Cardiovascular;  Laterality: N/A;  ? AMPUTATION Left 04/16/2021  ? Procedure: LEFT BELOW KNEE AMPUTATION;  Surgeon: Angelia Mould, MD;  Location: [facility] OR;  Service: Vascular;  Laterality: Left;  ? AMPUTATION Left 06/17/2021  ? Procedure: REVISION AMPUTATION BELOW KNEE;  Surgeon: Broadus John, MD;  Location: [place];  Service: Vascular;  Laterality: Left;  ? APPENDECTOMY    ? APPLICATION OF WOUND VAC Left 06/17/2021  ? Procedure: APPLICATION OF WOUND VAC;  Surgeon: Broadus John, MD;  Location: [place];  Service: Vascular;  Laterality: Left;  ? AV FISTULA PLACEMENT Left 08/03/2019  ? Procedure: LEFT ARM ARTERIOVENOUS (AV) CEPHALIC  FISTULA CREATION;  Surgeon: Waynetta Sandy, MD;  Location: [place];  Service: Vascular;  Laterality: Left;  ? BIOPSY  06/30/2019  ? Procedure: BIOPSY;  Surgeon: Ronald Lobo, MD;  Location: [place];  Service: Endoscopy;;  ? BIOPSY  08/02/2019  ? Procedure: BIOPSY;  Surgeon: Yetta Flock, MD;  Location: [facility] ENDOSCOPY;  Service: Gastroenterology;;  ? BIOPSY  02/08/2021  ? Procedure: BIOPSY;  Surgeon: Sharyn Creamer, MD;  Location: [place] ENDOSCOPY;  Service: Gastroenterology;;  ? BIOPSY  02/24/2021  ? Procedure: BIOPSY;  Surgeon: [name], MD;  Location: [facility] ENDOSCOPY;  Service: Gastroenterology;;  ? BIOPSY  03/26/2021  ? Procedure: BIOPSY;  Surgeon: Lavena Bullion, DO;  Location: [place] ENDOSCOPY;  Service: Gastroenterology;;  ? BIOPSY  08/06/2021  ? Procedure: BIOPSY;  Surgeon: [name], MD;  Location: [place];  Service: Gastroenterology;;  ?  COLONOSCOPY  01/23/2012  ? Procedure: COLONOSCOPY;  Surgeon: Danie Binder, MD;  [facility];  Service: Endoscopy;  Laterality: N/A;  11:10 AM  ? COLONOSCOPY WITH PROPOFOL N/A 06/30/2019  ? Procedure: COLONOSCOPY WITH PROPOFOL;  Surgeon: Ronald Lobo, MD;  Location: [place];  Service: Endoscopy;  Laterality: N/A;  ? ENTEROSCOPY N/A 08/02/2019  ? Procedure: ENTEROSCOPY;  Surgeon: Yetta Flock, MD;  Location: [facility] ENDOSCOPY;  Service: Gastroenterology;  Laterality: N/A;  ? ENTEROSCOPY N/A 02/24/2021  ? Procedure: ENTEROSCOPY;  Surgeon: [name], MD;  Location: [place];  Service: Gastroenterology;  Laterality: N/A;  ? ENTEROSCOPY N/A 03/26/2021  ? Procedure: ENTEROSCOPY;  Surgeon: Lavena Bullion, DO;  Location: [place];  Service: Gastroenterology;  Laterality: N/A;  ? ENTEROSCOPY N/A 07/05/2021  ? Procedure: ENTEROSCOPY;  Surgeon: Carol Ada, MD;  Location: [place];  Service: Gastroenterology;  Laterality: N/A;  ? ENTEROSCOPY N/A 08/06/2021  ? Procedure: ENTEROSCOPY;  Surgeon: [name], MD;  Location: [place];  Service: Gastroenterology;  Laterality: N/A;  ? ESOPHAGOGASTRODUODENOSCOPY N/A 08/10/2020  ? Procedure: ESOPHAGOGASTRODUODENOSCOPY (EGD);  Surgeon: Jerene Bears, MD;  Location: [facility] ENDOSCOPY;  Service: Gastroenterology;  Laterality: N/A;  ? ESOPHAGOGASTRODUODENOSCOPY (EGD) WITH PROPOFOL N/A 06/30/2019  ? Procedure: ESOPHAGOGASTRODUODENOSCOPY (EGD) WITH PROPOFOL;  Surgeon: Ronald Lobo, MD;  Location: [place];  Service: Endoscopy;  Laterality: N/A;  ? ESOPHAGOGASTRODUODENOSCOPY (EGD) WITH PROPOFOL N/A 01/12/2021  ? Procedure: ESOPHAGOGASTRODUODENOSCOPY (EGD) WITH PROPOFOL;  Surgeon: Sharyn Creamer, MD;  Location: [place];  Service: Gastroenterology;  Laterality: N/A;  ? ESOPHAGOGASTRODUODENOSCOPY (EGD) WITH PROPOFOL N/A 02/08/2021  ? Procedure: ESOPHAGOGASTRODUODENOSCOPY (EGD) WITH PROPOFOL;  Surgeon: Sharyn Creamer, MD;  Location: [place];  Service: Gastroenterology;  Laterality: N/A;  ? FISTULA SUPERFICIALIZATION Left 10/17/2019  ? Procedure: LEFT UPPER EXTREMITY FISTULA REVISION, SIDE BRANCH LIGATION,  AND SUPERFICIALIZATION;  Surgeon: Marty Heck, MD;  Location: [place];  Service: Vascular;  Laterality: Left;  ? FISTULA SUPERFICIALIZATION Left 04/19/2021  ? Procedure: PLICATION OF ANEURYSM LEFT ARTERIOVENOUS FISTULA;  Surgeon: Angelia Mould, MD;  Location: [place];  Service: Vascular;  Laterality: Left;  ? FLEXIBLE SIGMOIDOSCOPY N/A 07/05/2021  ? Procedure: FLEXIBLE SIGMOIDOSCOPY;  Surgeon: Carol Ada, MD;  Location: [place];  Service: Gastroenterology;  Laterality: N/A;  ? GIVENS CAPSULE STUDY N/A 06/30/2019  ? Procedure: GIVENS CAPSULE STUDY;  Surgeon: Ronald Lobo, MD;  Location: [facility] ENDOSCOPY;  Service: Endoscopy;  Laterality: N/A;  ? HEMOSTASIS CLIP PLACEMENT  08/10/2020  ? Procedure: HEMOSTASIS CLIP PLACEMENT;  Surgeon: Jerene Bears, MD;  Location: [facility] ENDOSCOPY;  Service: Gastroenterology;;  ? HEMOSTASIS CLIP PLACEMENT  01/12/2021  ? Procedure: HEMOSTASIS CLIP PLACEMENT;  Surgeon: Sharyn Creamer, MD;  Location: [place];  Service: Gastroenterology;;  ? HEMOSTASIS CONTROL  08/02/2019  ? Procedure: HEMOSTASIS CONTROL;  Surgeon: Yetta Flock, MD;  Location: [place];  Service: Gastroenterology;;  ? HOT HEMOSTASIS  02/24/2021  ? Procedure: HOT HEMOSTASIS (ARGON PLASMA COAGULATION/BICAP);  Surgeon: [name], MD;  Location: [place];  Service: Gastroenterology;;  ? HOT  HEMOSTASIS N/A 03/26/2021  ? Procedure: HOT HEMOSTASIS (ARGON PLASMA COAGULATION/BICAP);  Surgeon: Lavena Bullion, DO;  Location: [facility] ENDOSCOPY;  Service: Gastroenterology;  Laterality: N/A;  ? HOT HEMOSTASIS N/A 07/05/2021  ? Procedure: HOT HEMOSTASIS (ARGON PLASMA COAGULATION/BICAP);  Surgeon: Carol Ada, MD;  Location: [place];  Service: Gastroenterology;  Laterality: N/A;  ? INCISION AND DRAINAGE ABSCESS N/A  06/29/2016  ? Procedure: INCISION AND DRAINAGE ABDOMINAL WALL ABSCESS;  Surgeon: Alphonsa Overall, MD;  [facility];  Service: General;  Laterality: N/A;  ? INSERTION OF DIALYSIS CATHETER Right 08/03/2019  ? Procedure:

## 2021-08-17 NOTE — Consult Note (Signed)
[facility] [place] ? ?ASSESSMENT / PLAN: ?61 y.o. male with mild eschar about a healing left below-knee amputation.  Minimally displaced patella fracture.  I do not think he needs any further surgical management.  His BKA appears to be healing slowly.  I would recommend continued compression and elevation as tolerated.  Patellar fracture as per Dr. Tama Headings instructions. He can follow-up with [place] as an outpatient.  Please call us for any questions. ? ?CHIEF COMPLAINT: Left knee swelling and pain. ? ?HISTORY OF PRESENT ILLNESS: ?Daniel [name] is a 61 y.o. male well-known to our service with past medical history of end-stage renal disease, atherosclerosis of the lower extremities.  He underwent a left below-knee amputation on 04/16/2021 with Dr. Scot Dock.  This was washed out and debrided with a VAC dressing on 06/17/2021. This has been healing slowly since. I performed a right lower extremity superficial femoral artery stenting on 07/08/2021 for ischemic rest pain of the right foot.  I was asked to evaluate the patient for a patellar fracture and possible poorly healing below-knee amputation. ? ?The patient reports swelling and pain about his knee for some time.  He has been wearing a Ace wrap and stump protector.  Thankfully, his wound appears to be healing well on my evaluation today.  He is very anxious and does not want any further surgical therapy. ? ?Past Medical History:  ?Diagnosis Date  ? Anemia   ? Diabetes mellitus without complication ([place])   ? patient denies  ? Dialysis patient [facility])   ? End stage chronic kidney disease ([place])   ? Hypertension   ? ICH (intracerebral hemorrhage) ([place]) 05/20/2017  ? PAD (peripheral artery disease) ([place])   ? Shoulder pain, left 06/28/2013  ? ? ?Past Surgical History:  ?Procedure Laterality Date  ? A/V FISTULAGRAM N/A 08/15/2020  ? Procedure: A/V FISTULAGRAM - Left Upper;  Surgeon: Cherre Robins, MD;  Location: [place] CV LAB;  Service:  Cardiovascular;  Laterality: N/A;  ? ABDOMINAL AORTOGRAM W/LOWER EXTREMITY N/A 04/08/2021  ? Procedure: ABDOMINAL AORTOGRAM W/LOWER EXTREMITY;  Surgeon: Waynetta Sandy, MD;  Location: [place] CV LAB;  Service: Cardiovascular;  Laterality: N/A;  ? AMPUTATION Left 04/16/2021  ? Procedure: LEFT BELOW KNEE AMPUTATION;  Surgeon: Angelia Mould, MD;  Location: [facility] OR;  Service: Vascular;  Laterality: Left;  ? AMPUTATION Left 06/17/2021  ? Procedure: REVISION AMPUTATION BELOW KNEE;  Surgeon: Broadus John, MD;  Location: [place];  Service: Vascular;  Laterality: Left;  ? APPENDECTOMY    ? APPLICATION OF WOUND VAC Left 06/17/2021  ? Procedure: APPLICATION OF WOUND VAC;  Surgeon: Broadus John, MD;  Location: [place];  Service: Vascular;  Laterality: Left;  ? AV FISTULA PLACEMENT Left 08/03/2019  ? Procedure: LEFT ARM ARTERIOVENOUS (AV) CEPHALIC  FISTULA CREATION;  Surgeon: Waynetta Sandy, MD;  Location: [place];  Service: Vascular;  Laterality: Left;  ? BIOPSY  06/30/2019  ? Procedure: BIOPSY;  Surgeon: Ronald Lobo, MD;  Location: [place];  Service: Endoscopy;;  ? BIOPSY  08/02/2019  ? Procedure: BIOPSY;  Surgeon: Yetta Flock, MD;  Location: [facility] ENDOSCOPY;  Service: Gastroenterology;;  ? BIOPSY  02/08/2021  ? Procedure: BIOPSY;  Surgeon: Sharyn Creamer, MD;  Location: [facility] ENDOSCOPY;  Service: Gastroenterology;;  ? BIOPSY  02/24/2021  ? Procedure: BIOPSY;  Surgeon: [name], MD;  Location: [facility] ENDOSCOPY;  Service: Gastroenterology;;  ? BIOPSY  03/26/2021  ? Procedure: BIOPSY;  Surgeon: Bryan Lemma,  Dominic Pea, DO;  Location: [place] ENDOSCOPY;  Service: Gastroenterology;;  ? BIOPSY  08/06/2021  ? Procedure: BIOPSY;  Surgeon: [name], MD;  Location: [place];  Service: Gastroenterology;;  ? COLONOSCOPY  01/23/2012  ? Procedure: COLONOSCOPY;  Surgeon: Danie Binder, MD;  [facility];  Service: Endoscopy;  Laterality: N/A;  11:10 AM  ? COLONOSCOPY WITH PROPOFOL  N/A 06/30/2019  ? Procedure: COLONOSCOPY WITH PROPOFOL;  Surgeon: Ronald Lobo, MD;  Location: [place];  Service: Endoscopy;  Laterality: N/A;  ? ENTEROSCOPY N/A 08/02/2019  ? Procedure: ENTEROSCOPY;  Surgeon: Yetta Flock, MD;  Location: [facility] ENDOSCOPY;  Service: Gastroenterology;  Laterality: N/A;  ? ENTEROSCOPY N/A 02/24/2021  ? Procedure: ENTEROSCOPY;  Surgeon: [name], MD;  Location: [place];  Service: Gastroenterology;  Laterality: N/A;  ? ENTEROSCOPY N/A 03/26/2021  ? Procedure: ENTEROSCOPY;  Surgeon: Lavena Bullion, DO;  Location: [place];  Service: Gastroenterology;  Laterality: N/A;  ? ENTEROSCOPY N/A 07/05/2021  ? Procedure: ENTEROSCOPY;  Surgeon: Carol Ada, MD;  Location: [place];  Service: Gastroenterology;  Laterality: N/A;  ? ENTEROSCOPY N/A 08/06/2021  ? Procedure: ENTEROSCOPY;  Surgeon: [name], MD;  Location: [place];  Service: Gastroenterology;  Laterality: N/A;  ? ESOPHAGOGASTRODUODENOSCOPY N/A 08/10/2020  ? Procedure: ESOPHAGOGASTRODUODENOSCOPY (EGD);  Surgeon: Jerene Bears, MD;  Location: [facility] ENDOSCOPY;  Service: Gastroenterology;  Laterality: N/A;  ? ESOPHAGOGASTRODUODENOSCOPY (EGD) WITH PROPOFOL N/A 06/30/2019  ? Procedure: ESOPHAGOGASTRODUODENOSCOPY (EGD) WITH PROPOFOL;  Surgeon: Ronald Lobo, MD;  Location: [place];  Service: Endoscopy;  Laterality: N/A;  ? ESOPHAGOGASTRODUODENOSCOPY (EGD) WITH PROPOFOL N/A 01/12/2021  ? Procedure: ESOPHAGOGASTRODUODENOSCOPY (EGD) WITH PROPOFOL;  Surgeon: Sharyn Creamer, MD;  Location: [place];  Service: Gastroenterology;  Laterality: N/A;  ? ESOPHAGOGASTRODUODENOSCOPY (EGD) WITH PROPOFOL N/A 02/08/2021  ? Procedure: ESOPHAGOGASTRODUODENOSCOPY (EGD) WITH PROPOFOL;  Surgeon: Sharyn Creamer, MD;  Location: [place];  Service: Gastroenterology;  Laterality: N/A;  ? FISTULA SUPERFICIALIZATION Left 10/17/2019  ? Procedure: LEFT UPPER EXTREMITY FISTULA REVISION, SIDE BRANCH LIGATION,  AND  SUPERFICIALIZATION;  Surgeon: Marty Heck, MD;  Location: [place];  Service: Vascular;  Laterality: Left;  ? FISTULA SUPERFICIALIZATION Left 04/19/2021  ? Procedure: PLICATION OF ANEURYSM LEFT ARTERIOVENOUS FISTULA;  Surgeon: Angelia Mould, MD;  Location: [place];  Service: Vascular;  Laterality: Left;  ? FLEXIBLE SIGMOIDOSCOPY N/A 07/05/2021  ? Procedure: FLEXIBLE SIGMOIDOSCOPY;  Surgeon: Carol Ada, MD;  Location: [place];  Service: Gastroenterology;  Laterality: N/A;  ? GIVENS CAPSULE STUDY N/A 06/30/2019  ? Procedure: GIVENS CAPSULE STUDY;  Surgeon: Ronald Lobo, MD;  Location: [facility] ENDOSCOPY;  Service: Endoscopy;  Laterality: N/A;  ? HEMOSTASIS CLIP PLACEMENT  08/10/2020  ? Procedure: HEMOSTASIS CLIP PLACEMENT;  Surgeon: Jerene Bears, MD;  Location: [facility] ENDOSCOPY;  Service: Gastroenterology;;  ? HEMOSTASIS CLIP PLACEMENT  01/12/2021  ? Procedure: HEMOSTASIS CLIP PLACEMENT;  Surgeon: Sharyn Creamer, MD;  Location: [place];  Service: Gastroenterology;;  ? HEMOSTASIS CONTROL  08/02/2019  ? Procedure: HEMOSTASIS CONTROL;  Surgeon: Yetta Flock, MD;  Location: [place];  Service: Gastroenterology;;  ? HOT HEMOSTASIS  02/24/2021  ? Procedure: HOT HEMOSTASIS (ARGON PLASMA COAGULATION/BICAP);  Surgeon: [name], MD;  Location: [facility] ENDOSCOPY;  Service: Gastroenterology;;  ? HOT HEMOSTASIS N/A 03/26/2021  ? Procedure: HOT HEMOSTASIS (ARGON PLASMA COAGULATION/BICAP);  Surgeon: Lavena Bullion, DO;  Location: [facility] ENDOSCOPY;  Service: Gastroenterology;  Laterality: N/A;  ? HOT HEMOSTASIS N/A 07/05/2021  ? Procedure: HOT HEMOSTASIS (ARGON PLASMA  COAGULATION/BICAP);  Surgeon: Carol Ada, MD;  Location: [place];  Service: Gastroenterology;  Laterality: N/A;  ? INCISION AND DRAINAGE ABSCESS N/A 06/29/2016  ? Procedure: INCISION AND DRAINAGE ABDOMINAL WALL ABSCESS;  Surgeon: Alphonsa Overall, MD;  Location[facility];  Service: General;  Laterality: N/A;  ? INSERTION OF DIALYSIS  CATHETER Right 08/03/2019  ? Procedure: INSERTION OF DIALYSIS CATHETER;  Surgeon: Waynetta Sandy, MD;  Location: [place];  Service: Vascular;  Laterality: Right;  ? INSERTION OF DIALYSIS CATHETER Right 10/22/18

## 2021-08-17 NOTE — ED Notes (Signed)
Unit of PRBC started, no sing of adverse reaction noticed after 15 min of transfusion, pt is resting comfortable on bed, NAD noticed. ?

## 2021-08-18 LAB — RENAL FUNCTION PANEL
Albumin: 2.1 g/dL — ABNORMAL LOW (ref 3.5–5.0)
Anion gap: 15 (ref 5–15)
BUN: 60 mg/dL — ABNORMAL HIGH (ref 8–23)
CO2: 21 mmol/L — ABNORMAL LOW (ref 22–32)
Calcium: 8.4 mg/dL — ABNORMAL LOW (ref 8.9–10.3)
Chloride: 101 mmol/L (ref 98–111)
Creatinine, Ser: 10.55 mg/dL — ABNORMAL HIGH (ref 0.61–1.24)
GFR, Estimated: 5 mL/min — ABNORMAL LOW (ref >60)
Glucose, Bld: 115 mg/dL — ABNORMAL HIGH (ref 70–99)
Phosphorus: 4.5 mg/dL (ref 2.5–4.6)
Potassium: 5 mmol/L (ref 3.5–5.1)
Sodium: 137 mmol/L (ref 135–145)

## 2021-08-18 LAB — MAGNESIUM
Magnesium: 2.3 mg/dL (ref 1.7–2.4)
Magnesium: 2.2 mg/dL (ref 1.7–2.4)

## 2021-08-18 LAB — CBC WITH DIFFERENTIAL/PLATELET
Abs Immature Granulocytes: 0.01 10*3/uL (ref 0.00–0.07)
Basophils Absolute: 0 10*3/uL (ref 0.0–0.1)
Basophils Relative: 0 %
Eosinophils Absolute: 0.4 10*3/uL (ref 0.0–0.5)
Eosinophils Relative: 7 %
HCT: 24.3 % — ABNORMAL LOW (ref 39.0–52.0)
Hemoglobin: 7.8 g/dL — ABNORMAL LOW (ref 13.0–17.0)
Immature Granulocytes: 0 %
Lymphocytes Relative: 22 %
Lymphs Abs: 1.2 10*3/uL (ref 0.7–4.0)
MCH: 32.2 pg (ref 26.0–34.0)
MCHC: 32.1 g/dL (ref 30.0–36.0)
MCV: 100.4 fL — ABNORMAL HIGH (ref 80.0–100.0)
Monocytes Absolute: 1 10*3/uL (ref 0.1–1.0)
Monocytes Relative: 19 %
Neutro Abs: 2.8 10*3/uL (ref 1.7–7.7)
Neutrophils Relative %: 52 %
Platelets: 151 10*3/uL (ref 150–400)
RBC: 2.42 MIL/uL — ABNORMAL LOW (ref 4.22–5.81)
RDW: 20.9 % — ABNORMAL HIGH (ref 11.5–15.5)
WBC: 5.4 10*3/uL (ref 4.0–10.5)
nRBC: 0 % (ref 0.0–0.2)

## 2021-08-18 LAB — GLUCOSE, CAPILLARY
Glucose-Capillary: 111 mg/dL — ABNORMAL HIGH (ref 70–99)
Glucose-Capillary: 104 mg/dL — ABNORMAL HIGH (ref 70–99)
Glucose-Capillary: 119 mg/dL — ABNORMAL HIGH (ref 70–99)
Glucose-Capillary: 137 mg/dL — ABNORMAL HIGH (ref 70–99)
Glucose-Capillary: 100 mg/dL — ABNORMAL HIGH (ref 70–99)

## 2021-08-18 LAB — BASIC METABOLIC PANEL
Anion gap: 15 (ref 5–15)
BUN: 61 mg/dL — ABNORMAL HIGH (ref 8–23)
CO2: 22 mmol/L (ref 22–32)
Calcium: 8.4 mg/dL — ABNORMAL LOW (ref 8.9–10.3)
Chloride: 99 mmol/L (ref 98–111)
Creatinine, Ser: 10.59 mg/dL — ABNORMAL HIGH (ref 0.61–1.24)
GFR, Estimated: 5 mL/min — ABNORMAL LOW (ref >60)
Glucose, Bld: 106 mg/dL — ABNORMAL HIGH (ref 70–99)
Potassium: 4.7 mmol/L (ref 3.5–5.1)
Sodium: 136 mmol/L (ref 135–145)

## 2021-08-18 MED ORDER — CHLORHEXIDINE GLUCONATE CLOTH 2 % EX PADS
6.0000 | MEDICATED_PAD | Freq: Every day | CUTANEOUS | Status: DC
  Administered 2021-08-19: 6 via TOPICAL

## 2021-08-18 NOTE — Consult Note (Addendum)
Renal Service Consult Note [facility]  Daniel [name] 08/18/2021 Daniel Krabbe, MD Requesting Physician: Dr. Dartha [name]  Reason for Consult: ESRD pt w/ cellulitis of L BKA knee/ stump HPI: The patient is a 61 y.o. year-old w/ hx of DM2, anemia, eSRD on HD, HTN, ICH, PAD, sp L BKA, GIB presented w/ L leg / knee pain since a fall about 1 wk prior. In ED temp was 100.6, RR 20s, HR 90-100 and BP's up slightly. Labs showed creat 9, alb 2.3. Hb 6.7 and CXR showed vasc congestion. Pt got IV abx in ED. VVS consulted. We are asked to see for ESRD.    Pt seen in room. Got about 1/2 HD on Friday. No SOB, orthopnea or leg swelling. L knee/ stump area feeling much better today, still swollen but pain and redness decreased.   Pt on HD x 1 year. Live alone in a boarding house. Before that was homeless. Says the lady that runs the boarding wants him out at the end of the month. Not sure where he is going to go.     ROS - denies CP, no joint pain, no HA, no blurry vision, no rash, no diarrhea, no nausea/ vomiting   Past Medical History  Past Medical History:  Diagnosis Date   Anemia    Diabetes mellitus without complication [facility])    patient denies   Dialysis patient [facility])    End stage chronic kidney disease (HCC)    Hypertension    ICH (intracerebral hemorrhage) (HCC) 05/20/2017   PAD (peripheral artery disease) (HCC)    Shoulder pain, left 06/28/2013   Past Surgical History  Past Surgical History:  Procedure Laterality Date   A/V FISTULAGRAM N/A 08/15/2020   Procedure: A/V FISTULAGRAM - Left Upper;  Surgeon: Leonie Douglas, MD;  [facility] INVAS[facility] CV LAB;  Service: Cardiovascular;  Laterality: N/A;   ABDOMINAL AORTOGRAM W/LOWER EXTREMITY N/A 04/08/2021   Procedure: ABDOMINAL AORTOGRAM W/LOWER EXTREMITY;  Surgeon: Maeola Harman, MD;  Location: [facility] INVASIVE CV LAB;  Service: Cardiovascular;  Laterality: N/A;   AMPUTATION Left 04/16/2021   Procedure: LEFT BELOW  KNEE AMPUTATION;  Surgeon: Chuck Hint, MD;  Location: [facility] OR;  Service: Vascular;  Laterality: Left;   AMPUTATION Left 06/17/2021   Procedure: REVISION AMPUTATION BELOW KNEE;  Surgeon: Victorino Sparrow, MD;  Location: [facility] OR;  Service: Vascular;  Laterality: Left;   APPENDECTOMY     APPLICATION OF WOUND VAC Left 06/17/2021   Procedure: APPLICATION OF WOUND VAC;  Surgeon: Victorino Sparrow, MD;  Location: [facility] OR;  Service: Vascular;  Laterality: Left;   AV FISTULA PLACEMENT Left 08/03/2019   Procedure: LEFT ARM ARTERIOVENOUS (AV) CEPHALIC  FISTULA CREATION;  Surgeon: Maeola Harman, MD;  Location: [name] OR;  Service: Vascular;  Laterality: Left;   BIOPSY  06/30/2019   Procedure: BIOPSY;  Surgeon: Bernette Redbird, MD;  Location: [facility] ENDOSCOPY;  Service: Endoscopy;;   BIOPSY  08/02/2019   Procedure: BIOPSY;  Surgeon: Benancio Deeds, MD;  Location: [facility] ENDOSCOPY;  Service: Gastroenterology;;   BIOPSY  02/08/2021   Procedure: BIOPSY;  Surgeon: Imogene Burn, MD;  Location: [facility] ENDOSCOPY;  Service: Gastroenterology;;   BIOPSY  02/24/2021   Procedure: BIOPSY;  Surgeon: Jenel Lucks, MD;  Location: [facility] ENDOSCOPY;  Service: Gastroenterology;;   BIOPSY  03/26/2021   Procedure: BIOPSY;  Surgeon: Shellia Cleverly, DO;  Location: [facility] ENDOSCOPY;  Service: Gastroenterology;;   BIOPSY  08/06/2021   Procedure:  BIOPSY;  Surgeon: Jenel Lucks, MD;  Location: [facility] ENDOSCOPY;  Service: Gastroenterology;;   COLONOSCOPY  01/23/2012   Procedure: COLONOSCOPY;  Surgeon: West Bali, MD;  Location: [facility] ENDO SUITE;  Service: Endoscopy;  Laterality: N/A;  11:10 AM   COLONOSCOPY WITH PROPOFOL N/A 06/30/2019   Procedure: COLONOSCOPY WITH PROPOFOL;  Surgeon: Bernette Redbird, MD;  Location: [facility] ENDOSCOPY;  Service: Endoscopy;  Laterality: N/A;   ENTEROSCOPY N/A 08/02/2019   Procedure: ENTEROSCOPY;  Surgeon: Benancio Deeds, MD;  Location: [facility] ENDOSCOPY;  Service: Gastroenterology;  Laterality: N/A;    ENTEROSCOPY N/A 02/24/2021   Procedure: ENTEROSCOPY;  Surgeon: Jenel Lucks, MD;  Location: [facility] ENDOSCOPY;  Service: Gastroenterology;  Laterality: N/A;   ENTEROSCOPY N/A 03/26/2021   Procedure: ENTEROSCOPY;  Surgeon: Shellia Cleverly, DO;  Location: [facility] ENDOSCOPY;  Service: Gastroenterology;  Laterality: N/A;   ENTEROSCOPY N/A 07/05/2021   Procedure: ENTEROSCOPY;  Surgeon: Jeani Hawking, MD;  Location: [facility] ENDOSCOPY;  Service: Gastroenterology;  Laterality: N/A;   ENTEROSCOPY N/A 08/06/2021   Procedure: ENTEROSCOPY;  Surgeon: Jenel Lucks, MD;  Location: [facility] ENDOSCOPY;  Service: Gastroenterology;  Laterality: N/A;   ESOPHAGOGASTRODUODENOSCOPY N/A 08/10/2020   Procedure: ESOPHAGOGASTRODUODENOSCOPY (EGD);  Surgeon: Beverley Fiedler, MD;  Location: [facility] ENDOSCOPY;  Service: Gastroenterology;  Laterality: N/A;   ESOPHAGOGASTRODUODENOSCOPY (EGD) WITH PROPOFOL N/A 06/30/2019   Procedure: ESOPHAGOGASTRODUODENOSCOPY (EGD) WITH PROPOFOL;  Surgeon: Bernette Redbird, MD;  Location: [facility] ENDOSCOPY;  Service: Endoscopy;  Laterality: N/A;   ESOPHAGOGASTRODUODENOSCOPY (EGD) WITH PROPOFOL N/A 01/12/2021   Procedure: ESOPHAGOGASTRODUODENOSCOPY (EGD) WITH PROPOFOL;  Surgeon: Imogene Burn, MD;  Location: [facility] ENDOSCOPY;  Service: Gastroenterology;  Laterality: N/A;   ESOPHAGOGASTRODUODENOSCOPY (EGD) WITH PROPOFOL N/A 02/08/2021   Procedure: ESOPHAGOGASTRODUODENOSCOPY (EGD) WITH PROPOFOL;  Surgeon: Imogene Burn, MD;  Location: [facility] ENDOSCOPY;  Service: Gastroenterology;  Laterality: N/A;   FISTULA SUPERFICIALIZATION Left 10/17/2019   Procedure: LEFT UPPER EXTREMITY FISTULA REVISION, SIDE BRANCH LIGATION,  AND SUPERFICIALIZATION;  Surgeon: Cephus Shelling, MD;  Location: [facility] OR;  Service: Vascular;  Laterality: Left;   FISTULA SUPERFICIALIZATION Left 04/19/2021   Procedure: PLICATION OF ANEURYSM LEFT ARTERIOVENOUS FISTULA;  Surgeon: Chuck Hint, MD;  Location: [facility] OR;  Service: Vascular;  Laterality: Left;    FLEXIBLE SIGMOIDOSCOPY N/A 07/05/2021   Procedure: FLEXIBLE SIGMOIDOSCOPY;  Surgeon: Jeani Hawking, MD;  Location: [facility] ENDOSCOPY;  Service: Gastroenterology;  Laterality: N/A;   GIVENS CAPSULE STUDY N/A 06/30/2019   Procedure: GIVENS CAPSULE STUDY;  Surgeon: Bernette Redbird, MD;  Location: [facility] ENDOSCOPY;  Service: Endoscopy;  Laterality: N/A;   HEMOSTASIS CLIP PLACEMENT  08/10/2020   Procedure: HEMOSTASIS CLIP PLACEMENT;  Surgeon: Beverley Fiedler, MD;  Location: [facility] ENDOSCOPY;  Service: Gastroenterology;;   HEMOSTASIS CLIP PLACEMENT  01/12/2021   Procedure: HEMOSTASIS CLIP PLACEMENT;  Surgeon: Imogene Burn, MD;  Location: [facility] ENDOSCOPY;  Service: Gastroenterology;;   HEMOSTASIS CONTROL  08/02/2019   Procedure: HEMOSTASIS CONTROL;  Surgeon: Benancio Deeds, MD;  Location: [facility] ENDOSCOPY;  Service: Gastroenterology;;   HOT HEMOSTASIS  02/24/2021   Procedure: HOT HEMOSTASIS (ARGON PLASMA COAGULATION/BICAP);  Surgeon: Jenel Lucks, MD;  Location: [facility] ENDOSCOPY;  Service: Gastroenterology;;   HOT HEMOSTASIS N/A 03/26/2021   Procedure: HOT HEMOSTASIS (ARGON PLASMA COAGULATION/BICAP);  Surgeon: Shellia Cleverly, DO;  Location: [facility] ENDOSCOPY;  Service: Gastroenterology;  Laterality: N/A;   HOT HEMOSTASIS N/A 07/05/2021   Procedure: HOT HEMOSTASIS (ARGON PLASMA COAGULATION/BICAP);  Surgeon: Jeani Hawking, MD;  Location: [facility] ENDOSCOPY;  Service: Gastroenterology;  Laterality: N/A;  INCISION AND DRAINAGE ABSCESS N/A 06/29/2016   Procedure: INCISION AND DRAINAGE ABDOMINAL WALL ABSCESS;  Surgeon: Ovidio Kin, MD;  Location: [facility] ORS;  Service: General;  Laterality: N/A;   INSERTION OF DIALYSIS CATHETER Right 08/03/2019   Procedure: INSERTION OF DIALYSIS CATHETER;  Surgeon: Maeola Harman, MD;  Location: [facility] OR;  Service: Vascular;  Laterality: Right;   INSERTION OF DIALYSIS CATHETER Right 10/22/2019   Procedure: INSERTION OF 23CM TUNNELED DIALYSIS CATHETER RIGHT INTERNAL JUGULAR;  Surgeon: Chuck Hint, MD;  Location: [facility] OR;  Service: Vascular;  Laterality: Right;   INSERTION OF DIALYSIS CATHETER Right 08/12/2020   Procedure: INSERTION OF Right internal Jugular TUNNELED  DIALYSIS CATHETER.;  Surgeon: Maeola Harman, MD;  Location: [facility] OR;  Service: Vascular;  Laterality: Right;   INSERTION OF DIALYSIS CATHETER N/A 04/19/2021   Procedure: INSERTION OF TUNNELED DIALYSIS CATHETER;  Surgeon: Chuck Hint, MD;  Location: [facility] OR;  Service: Vascular;  Laterality: N/A;   Left heel surgery     LOWER EXTREMITY ANGIOGRAPHY N/A 07/08/2021   Procedure: Lower Extremity Angiography;  Surgeon: Leonie Douglas, MD;  Location: [facility] INVASIVE CV LAB;  Service: Cardiovascular;  Laterality: N/A;   PENILE BIOPSY N/A 03/26/2020   Procedure: PENILE ULCER DEBRIDEMENT;  Surgeon: Belva Agee, MD;  Location: [facility] ORS;  Service: Urology;  Laterality: N/A;  30 MINS   PERIPHERAL VASCULAR INTERVENTION Right 07/08/2021   Procedure: PERIPHERAL VASCULAR INTERVENTION;  Surgeon: Leonie Douglas, MD;  [facility] INVASIVE CV LAB;  Service: Cardiovascular;  Laterality: Right;   SCLEROTHERAPY  01/12/2021   Procedure: SCLEROTHERAPY;  Surgeon: Imogene Burn, MD;  Location: [facility] ENDOSCOPY;  Service: Gastroenterology;;   Susa Day  02/24/2021   Procedure: Susa Day;  Surgeon: Jenel Lucks, MD;  Location: [facility] ENDOSCOPY;  Service: Gastroenterology;;   Family History  Family History  Problem Relation Age of Onset   Colon cancer Neg Hx    Social History  reports that he has been smoking cigarettes. He has a 22.50 pack-year smoking history. He has never used smokeless tobacco. He reports that he does not currently use alcohol. He reports that he does not currently use drugs after having used the following drugs: "Crack" cocaine. Allergies  Allergies  Allergen Reactions   Seroquel [Quetiapine] Other (See Comments)    Tardive kinesia/dystonia   Dilaudid [Hydromorphone Hcl] Itching and Other  (See Comments)    Pt reports itchiness after IM injection    Home medications Prior to Admission medications   Medication Sig Start Date End Date Taking? Authorizing Provider  amLODipine (NORVASC) 2.5 MG tablet Take 1 tablet (2.5 mg total) by mouth at bedtime. 08/10/21   Osvaldo Shipper, MD  aspirin 81 MG EC tablet Take 1 tablet (81 mg total) by mouth daily. Swallow whole. 08/10/21   Osvaldo Shipper, MD  atorvastatin (LIPITOR) 40 MG tablet Take 1 tablet (40 mg total) by mouth daily. 08/10/21   Osvaldo Shipper, MD  blood glucose meter kit and supplies KIT Dispense based on patient and insurance preference. Use up to four times daily as directed. (FOR ICD-9 250.00, 250.01). 07/01/16   Rhetta Mura, MD  Calcium Acetate 667 MG TABS Take 1 tablet by mouth in the morning, at noon, and at bedtime. 08/10/21   Osvaldo Shipper, MD  ferrous sulfate 325 (65 FE) MG tablet Take 1 tablet (325 mg total) by mouth every morning. 08/10/21   Osvaldo Shipper, MD  fluconazole (DIFLUCAN) 100 MG tablet Take 1 tablet (100 mg total) by  mouth daily. 08/12/21   Jenel Lucks, MD  gabapentin (NEURONTIN) 100 MG capsule Take 1 capsule (100 mg total) by mouth 3 (three) times daily. 08/10/21   Osvaldo Shipper, MD  HYDROcodone-acetaminophen (NORCO/VICODIN) 5-325 MG tablet Take 1 tablet by mouth every 6 (six) hours as needed for severe pain. 08/10/21   Osvaldo Shipper, MD  hydrOXYzine (ATARAX) 25 MG tablet Take 1 tablet (25 mg total) by mouth every 8 (eight) hours as needed for itching. 08/10/21   Osvaldo Shipper, MD  loperamide (IMODIUM A-D) 2 MG tablet Take 4 mg by mouth in the morning, at noon, and at bedtime.    [provider]  Multiple Vitamin (MULTIVITAMIN WITH MINERALS) TABS tablet Take 1 tablet by mouth every morning.    [provider]  pantoprazole (PROTONIX) 40 MG tablet Take 1 tablet (40 mg total) by mouth daily. 08/10/21   Osvaldo Shipper, MD  saccharomyces boulardii (FLORASTOR) 250 MG capsule Take  1 capsule (250 mg total) by mouth 2 (two) times daily. 04/23/21   Osvaldo Shipper, MD  colchicine 0.6 MG tablet Take 0.5 tablets (0.3 mg total) by mouth 2 (two) times daily. 07/24/20 07/29/20  Eber Hong, MD  furosemide (LASIX) 40 MG tablet Take 1 tablet (40 mg total) by mouth daily. 07/25/19 02/09/20  Thom Chimes, MD     Vitals:   08/18/21 0018 08/18/21 0454 08/18/21 0527 08/18/21 0814  BP: (!) 142/50  (!) 145/56 (!) 142/53  Pulse: 95  94 93  Resp: 18  18 18   Temp: 98.2 F (36.8 C)  98.5 F (36.9 C) 98.3 F (36.8 C)  TempSrc: Oral   Oral  SpO2: 100%  100% 100%  Weight:  113.9 kg    Height:       Exam Gen alert, no distress No rash, cyanosis or gangrene Sclera anicteric, throat clear  No jvd or bruits Chest clear bilat to bases, no rales/ wheezing RRR no RG Abd soft ntnd no mass or ascites +bs GU normal male MS L BKA Ext 1-2+ L knee/ stump edema, minimal erythema   No RLE edema Neuro is alert, Ox 3 , nf    LUA aVF+bruit   Home meds include - norvasc 2.5, asa, lipitor, phoslo ac tid, neurontin 100 tid, norco prn, protonix, prns/ vits/ supps   OP HD: SW MWF  4h 450/500  115.5kg  2/2 bath  LUA AVF   Hep none  - 4/12 > HBsAg neg w/ Abs > 10/ protective  - last HD 4.21 post 117kg  - Hb 7.2 on 4/21, tsat 11%, ferr 67, pth 165  - last mircera 225 ug on 4/05  - Na thio 25 gm each HD  Assessment/ Plan: Cellulitis - of L knee after a fall. With mild sepsis picture. Started on IV abx. Per pmd.  ESRD - on HD MWF. Had partial HD Friday at OP center. Under dry wt, on RA, clear lungs. BP's good. Plan UF 2-3 L as tolerated w/ HD tomorrow. Possibly lower dry wt.  Volume - see above.  Anemia ckd - Hb 6.7 > 7.8 after 1u prbcs.  Hb down overall though. Recently admitted for GIB. FOBT pending. Per pmd.  PAD - sp L BKA about 4 mos ago  HTN - continues on norvasc DM2 - per pmd      Vinson Moselle  MD 08/18/2021, 8:30 AM Recent Labs  Lab 08/17/21 0635 08/18/21 0059 08/18/21 0405   HGB 7.7* 7.8*  --   ALBUMIN 2.2* 2.1*  --  CALCIUM 8.1* 8.4* 8.4*  PHOS  --  4.5  --   CREATININE 9.88* 10.55* 10.59*  K 4.9 5.0 4.7

## 2021-08-18 NOTE — Progress Notes (Signed)
?PROGRESS NOTE ? ? ? ?Daniel [name]  TLX:[id number] DOB: 1960-07-25 DOA: 08/16/2021 ?PCP: [name], NP  ?Outpatient Specialists:  ? ? ? ?Brief Narrative:  ?As per H&P done: "Daniel [name] is a 61 y.o. male with medical history significant of ESRD on HD, chronic hepatitis C, PAD, anemia, GI bleed, hypertension, diabetes, GERD, CHF, status post left BKA, lung mass, concern for [facility] presenting with leg pain. ? ?Patient has had ongoing leg pain since having a fall a week ago.  He was seen for this a week ago and noted to have patellar fracture with plan for outpatient Ortho follow-up.  He has had continued pain and swelling which have actually been worsening since that time.  No purulence noted. ? ?He denies fevers, chills, chest pain, shortness of breath, abdominal pain, constipation, diarrhea, nausea, vomiting. ?  ?ED Course: Vital signs in the ED significant for fever to 100.6, respiratory rate in the teens to 20s, heart rate in the 90s to 100s, blood pressure in the 741U to 384T systolic.  Lab work-up included CMP with BUN 50 and creatinine 9.68 consistent with ESRD, glucose 168, calcium 8, albumin 2.3.  CBC with hemoglobin of 6.7 down from 9.2 on discharge.  Lactic acid normal with repeat pending.  Respiratory panel for flu and COVID pending.  Patient has been typed and screened in the ED.  Blood cultures pending.  Chest x-ray showing cardiomegaly vascular congestion and mild edema.  Left knee x-ray showing patella fracture decreased joint effusion and status post BKA.  Patient received vancomycin and ceftriaxone in the ED as well as a dose of morphine and Tylenol.  Vascular surgery was consulted and stated they will see the patient and consider an AKA.  Patient would like to avoid additional amputation and has asked for second opinion.  Orthopedics was consulted and will see the patient.  Nephrology also consulted for dialysis in the ED". ? ?08/17/2021: Patient seen.  Patient is not a particularly  good historian.  Patient tells me that he was volume overloaded prior to presentation.  Patient maintains that he was compliant with hemodialysis.  Patient still makes urine.  Patient has left upper extremity hemodialysis access.  Recent BKA done (about 4 months ago).  Minimal open wound around the stump.  Patient tells me there was some concerns about cellulitis around the stump area.  No fever or chills.  No other constitutional symptoms reported. ? ?08/18/2021: Patient seen.  For hemodialysis tomorrow.  Input from vascular and orthopedic surgery team is highly appreciated.  No further procedure as per the surgical team.  Conservative management.  Likely, patient be discharged tomorrow after hemodialysis. ? ?Assessment & Plan: ?  ?Active Problems: ?  ESRD (end stage renal disease) on dialysis [facility]) ?  Chronic hepatitis C without hepatic coma (HCC) ?  Hepatocellular carcinoma ([place]) ?  Lung mass ?  PAD (peripheral artery disease) ([place]) ?  Essential hypertension ?  DM2 (diabetes mellitus, type 2) ([place]) ?  Anemia ?  GERD (gastroesophageal reflux disease) ?  Sepsis ([place]) ?  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 ? ?Sepsis secondary to cellulitis ?> Patient presenting with worsening leg pain, erythema, warmth in the setting of recent fall and patellar fracture.  Also in the setting of status post left BKA. ?> Knee area has worsened since previous evaluation following fall.  Imaging showed decrease in the joint effusion but outer swelling is increasing and has appearance of cellulitis. ?> Noted to have fever to 100.6 and  was tachycardic meeting criteria for sepsis.  Currently no systemic leukocytosis. ?> No IV fluid bolus due to ESRD status. ?> Concern for cellulitis and possible joint infection (though seems less likely with decreased joint effusion.).  No evidence of osteomyelitis on x-ray. ?> As per ED course, vascular surgery was consulted and will evaluate the patient but a second opinion was also obtained with the  orthopedics consult who will evaluate the patient as he would like to avoid amputation for AKA if possible. ?- Appreciate vascular surgery and orthopedic surgery recommendations ?- Monitor on telemetry ?- Continue with ceftriaxone ?- As needed pain control ?- Trend fever curve and white count ?- Follow-up blood cultures ?08/17/2021: Sepsis physiology has resolved.  Continue to antibiotics.  Patient is currently on IV vancomycin and ceftriaxone. ?08/18/2021: Continue IV antibiotics. ?  ?Anemia ?> Recent admit for GI bleed.  Now with hemoglobin 6.7 down from 9.2.  Possibly degree of delusional anemia in the setting of volume overload as below however this does not seem to explain the full drop in hemoglobin. ?> Does not report any bloody stools at this point. ?> Transfusion of 1 unit has been ordered in the ED. ?- Check FOBT, if positive will need GI consult as well ?- Follow-up repeat CBC ?- Transfuse hemoglobin less than 7 ?- 40 mg IV Lasix given volume overload, to reduce risk of TACO ?08/17/2021: We deferred today nephrology team.  Patient may benefit from IV iron and ESA. ? ?Volume overload ?CHF ?ESRD ?> Known ESRD patient on HD.  Presenting with evidence of volume overload with vascular congestion, cardiomegaly, mild edema on chest x-ray. ?> Also known history of diastolic CHF last echo in February with EF 55-60% and normal RV function. ?> No significant shortness of breath.  Edema is present. ?> Nephrology has been consulted in the ED for dialysis ?- Appreciate nephrology assistance with this patient ?- Volume management with dialysis ?- We will give a dose of Lasix now as he does make urine and will be receiving a unit of blood ?08/17/2021: Hopefully, this will improve with hemodialysis. ?08/18/2021: Follow dialysis tomorrow. ?  ?PAD ?- Continue home aspirin and atorvastatin ?  ?Hypertension ?- Continue home amlodipine ? ?Diabetes ?- SSI ? ?GERD ?- PPI ? ?Lung mass ?Findings consistent with [facility] ?- Noted during  previous admission outpatient oncology referral was placed ? ?DVT prophylaxis: SCD ?Code Status: Full code ?Family Communication:  ?Disposition Plan:  ? ? ?Consultants:  ?Nephrology ? ?Procedures:  ? ? ?Antimicrobials:  ?IV ceftriaxone ?IV vancomycin ? ? ?Subjective: ?No new complaints. ?No fever or chills. ? ?Objective: ?Vitals:  ? 08/18/21 0454 08/18/21 0527 08/18/21 0814 08/18/21 1709  ?BP:  (!) 145/56 (!) 142/53 (!) 175/86  ?Pulse:  94 93 91  ?Resp:  '18 18 18  '$ ?Temp:  98.5 ?F (36.9 ?C) 98.3 ?F (36.8 ?C) 98 ?F (36.7 ?C)  ?TempSrc:   Oral Oral  ?SpO2:  100% 100% 100%  ?Weight: 113.9 kg     ?Height:      ? ? ?Intake/Output Summary (Last 24 hours) at 08/18/2021 1739 ?Last data filed at 08/18/2021 1700 ?Gross per 24 hour  ?Intake 580 ml  ?Output 400 ml  ?Net 180 ml  ? ? ?Filed Weights  ? 08/16/21 2032 08/18/21 0454  ?Weight: 115 kg 113.9 kg  ? ? ?Examination: ? ?General exam: Appears calm and comfortable.  Patient is obese.  Patient is s/p left BKA.  Mild nonhealed area around the stump. ?Respiratory system: Clear to  auscultation.  ?Cardiovascular system: S1 & S2 heard. ?Gastrointestinal system: Abdomen is nondistended, soft and nontender. No organomegaly or masses felt. Normal bowel sounds heard. ?Central nervous system: Alert and oriented. No focal neurological deficits. ?Extremities: Patient is status post right BKA.  See above documentation. ? ?Data Reviewed: I have personally reviewed following labs and imaging studies ? ?CBC: ?Recent Labs  ?Lab 08/12/21 ?3151 08/16/21 ?2035 08/17/21 ?7616 08/18/21 ?0737  ?WBC  --  6.2 5.7 5.4  ?NEUTROABS  --  3.8  --  2.8  ?HGB 9.2* 6.7* 7.7* 7.8*  ?HCT 27.0* 20.8* 24.1* 24.3*  ?MCV  --  103.0* 100.4* 100.4*  ?PLT  --  159 151 151  ? ? ?Basic Metabolic Panel: ?Recent Labs  ?Lab 08/12/21 ?1062 08/16/21 ?2035 08/17/21 ?6948 08/18/21 ?5462 08/18/21 ?0405  ?NA 131* 137 136 137 136  ?K 3.5 4.5 4.9 5.0 4.7  ?CL 97* 101 99 101 99  ?CO2  --  24 24 21* 22  ?GLUCOSE 139* 168* 89 115* 106*   ?BUN 32* 50* 52* 60* 61*  ?CREATININE 9.00* 9.68* 9.88* 10.55* 10.59*  ?CALCIUM  --  8.0* 8.1* 8.4* 8.4*  ?MG  --   --   --  2.3 2.2  ?PHOS  --   --   --  4.5  --   ? ? ?GFR: ?Estimated Creatinine Clearance:

## 2021-08-18 NOTE — Progress Notes (Signed)
Informed by CCMD that patient experienced 6 beats of Vtach. On call provider aware. Labs ordered by MD   ?

## 2021-08-19 ENCOUNTER — Other Ambulatory Visit: Payer: Self-pay

## 2021-08-19 DIAGNOSIS — E1169 Type 2 diabetes mellitus with other specified complication: Secondary | ICD-10-CM

## 2021-08-19 DIAGNOSIS — N186 End stage renal disease: Secondary | ICD-10-CM

## 2021-08-19 DIAGNOSIS — I10 Essential (primary) hypertension: Secondary | ICD-10-CM

## 2021-08-19 LAB — BASIC METABOLIC PANEL
Anion gap: 14 (ref 5–15)
BUN: 64 mg/dL — ABNORMAL HIGH (ref 8–23)
CO2: 20 mmol/L — ABNORMAL LOW (ref 22–32)
Calcium: 8.4 mg/dL — ABNORMAL LOW (ref 8.9–10.3)
Chloride: 101 mmol/L (ref 98–111)
Creatinine, Ser: 10.98 mg/dL — ABNORMAL HIGH (ref 0.61–1.24)
GFR, Estimated: 5 mL/min — ABNORMAL LOW (ref >60)
Glucose, Bld: 113 mg/dL — ABNORMAL HIGH (ref 70–99)
Potassium: 5 mmol/L (ref 3.5–5.1)
Sodium: 135 mmol/L (ref 135–145)

## 2021-08-19 LAB — HEMOGLOBIN AND HEMATOCRIT, BLOOD
HCT: 27.2 % — ABNORMAL LOW (ref 39.0–52.0)
Hemoglobin: 9.1 g/dL — ABNORMAL LOW (ref 13.0–17.0)

## 2021-08-19 LAB — CBC
HCT: 21.8 % — ABNORMAL LOW (ref 39.0–52.0)
Hemoglobin: 7 g/dL — ABNORMAL LOW (ref 13.0–17.0)
MCH: 32.1 pg (ref 26.0–34.0)
MCHC: 32.1 g/dL (ref 30.0–36.0)
MCV: 100 fL (ref 80.0–100.0)
Platelets: 142 10*3/uL — ABNORMAL LOW (ref 150–400)
RBC: 2.18 MIL/uL — ABNORMAL LOW (ref 4.22–5.81)
RDW: 20.3 % — ABNORMAL HIGH (ref 11.5–15.5)
WBC: 5.6 10*3/uL (ref 4.0–10.5)
nRBC: 0 % (ref 0.0–0.2)

## 2021-08-19 LAB — PHOSPHORUS: Phosphorus: 5.2 mg/dL — ABNORMAL HIGH (ref 2.5–4.6)

## 2021-08-19 LAB — GLUCOSE, CAPILLARY
Glucose-Capillary: 111 mg/dL — ABNORMAL HIGH (ref 70–99)
Glucose-Capillary: 115 mg/dL — ABNORMAL HIGH (ref 70–99)

## 2021-08-19 LAB — MAGNESIUM: Magnesium: 2.3 mg/dL (ref 1.7–2.4)

## 2021-08-19 LAB — PREPARE RBC (CROSSMATCH)

## 2021-08-19 MED ORDER — CALCIUM ACETATE (PHOS BINDER) 667 MG PO CAPS: 667.0000 mg | ORAL_CAPSULE | Freq: Three times a day (TID) | ORAL | Status: DC

## 2021-08-19 MED ORDER — SODIUM THIOSULFATE 250 MG/ML IV SOLN
25.0000 g | INTRAVENOUS | Status: DC
  Administered 2021-08-19: 25 g via INTRAVENOUS
  Filled 2021-08-19 (×2): qty 100

## 2021-08-19 MED ORDER — DOXYCYCLINE HYCLATE 50 MG PO CAPS: 100.0000 mg | ORAL_CAPSULE | Freq: Two times a day (BID) | ORAL | 0 refills | Status: DC

## 2021-08-19 MED ORDER — SODIUM CHLORIDE 0.9% IV SOLUTION: Freq: Once | INTRAVENOUS | Status: DC

## 2021-08-19 MED ORDER — AMOXICILLIN-POT CLAVULANATE 875-125 MG PO TABS: 1.0000 | ORAL_TABLET | Freq: Two times a day (BID) | ORAL | 0 refills | Status: DC

## 2021-08-19 MED ORDER — DARBEPOETIN ALFA 100 MCG/0.5ML IJ SOSY
100.0000 ug | PREFILLED_SYRINGE | INTRAMUSCULAR | Status: DC
  Administered 2021-08-19: 100 ug via INTRAVENOUS
  Filled 2021-08-19: qty 0.5

## 2021-08-19 MED ORDER — DARBEPOETIN ALFA 100 MCG/0.5ML IJ SOSY: 100.0000 ug | PREFILLED_SYRINGE | INTRAMUSCULAR | 0 refills | Status: DC

## 2021-08-19 NOTE — Consult Note (Signed)
? ?  [facility] CM Inpatient Consult ? ? ?08/19/2021 ? ?Daniel [name] ?July 08, 1960 ?[id number] ? ?[place] Organization [ACO] Patient: Medicare ACO [place] ? ?Primary Care Provider:  [name], NP [facility] ? ? ?Patient screened for extreme high risk score for unplanned readmission risk and  to assess for potential [place] [facility] service needs for post hospital transition.  Review of patient's medical record reveals patient transitioned home after HD.  ? ? ?Plan:  Assign to [place] for post hospital follow up after review of inpatient [facility] LCSW notes for community housing issues. ? ?For questions contact:  ? ?Natividad Brood, RN BSN CCM ?[facility] Liaison ? [phone] business mobile phone ?Toll free office [phone]  ?Fax number: [phone] ?[email or web address]  ? ? ? ?

## 2021-08-19 NOTE — Progress Notes (Signed)
?   08/19/21 1120  ?Vitals  ?Temp 98.6 ?F (37 ?C)  ?Temp Source Oral  ?BP (!) 184/93  ?BP Location Right Leg  ?BP Method Automatic  ?Patient Position (if appropriate) Lying  ?Pulse Rate 79  ?Pulse Rate Source Monitor  ?ECG Heart Rate 80  ?Resp 17  ?Oxygen Therapy  ?SpO2 100 %  ?O2 Device Room Air  ?Dialysis Weight  ?Weight 66.5 kg  ?Type of Weight Post-Dialysis  ?Post-Hemodialysis Assessment  ?Rinseback Volume (mL) 250 mL  ?KECN 284 V  ?Dialyzer Clearance Lightly streaked  ?Duration of HD Treatment -hour(s) 4.25 hour(s)  ?Hemodialysis Intake (mL) 1000 mL  ?UF Total -Machine (mL) 4000 mL  ?Net UF (mL) 3000 mL  ?Tolerated HD Treatment Yes  ?AVG/AVF Arterial Site Held (minutes) 10 minutes  ?AVG/AVF Venous Site Held (minutes) 10 minutes  ?Fistula / Graft Left Other (Comment) Arteriovenous fistula  ?Placement Date/Time: (c) 08/03/19 1328   Placed prior to admission: No  Orientation: Left  Access Location: (c) Other (Comment)  Access Type: (c) Arteriovenous fistula  ?Site Condition No complications  ?Fistula / Graft Assessment Present;Thrill;Bruit  ?Status Deaccessed  ? ?HD tx complete, tx extended 15 minutes for transfusion, received one unit of PRBC, ended at 1109. Sodium theosulfate infusing via pump due to late availability. No adverse events noted.Pt stable for transfer.  ?

## 2021-08-19 NOTE — Progress Notes (Signed)
[facility] ?NEPHROLOGY PROGRESS NOTE ? ?Assessment/ Plan: ?Pt is a 61 y.o. yo male  w/ hx of DM2, anemia, eSRD on HD, HTN, ICH, PAD, sp L BKA, GIB presented w/ L leg / knee pain and cellulitis. ? ? OP HD: SW MWF ? 4h 450/500  115.5kg  2/2 bath  LUA AVF   Hep none ? - 4/12 > HBsAg neg w/ Abs > 10/ protective ? - last HD 4.21 post 117kg ? - Hb 7.2 on 4/21, tsat 11%, ferr 67, pth 165 ? - last mircera 225 ug on 4/05 ? - Na thio 25 gm each HD ? ? ?# Leg cellulitis: mild sepsis+, on IV ceftriaxone and vancomycin per primary team.  ? ?# ESRD MWF: getting HD today, BP acceptable, UF goal around 3 L. ? ?# Anemia: w/GIB: received a unit of prbc, order a dose of aranesp.  ? 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 ? ?# HTN/volume: on norvasc, vol up on exam, UF as tolerated.  ? ?# Calciphylaxis, ? 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  ? ?Subjective:  Seen and examined in dialysis unit, non/v/d. No cp or sob. Tolerating HD well.  ?Objective ?Vital signs in last 24 hours: ?Vitals:  ? 08/19/21 0712 08/19/21 0715 08/19/21 0720 08/19/21 0800  ?BP: (!) 161/84 (!) 161/86 (!) 163/96 (!) 159/49  ?Pulse: 92 92 90 94  ?Resp: 20   (!) 21  ?Temp: 98.1 ?F (36.7 ?C)     ?TempSrc: Oral     ?SpO2: 100%     ?Weight: 67.7 kg     ?Height:      ? ?Weight change:  ? ?Intake/Output Summary (Last 24 hours) at 08/19/2021 0828 ?Last data filed at 08/19/2021 0534 ?Gross per 24 hour  ?Intake 103 ml  ?Output 500 ml  ?Net -397 ml  ? ? ? ? ? ?Labs: ?Basic Metabolic Panel: ?Recent Labs  ?Lab 08/18/21 ?6546 08/18/21 ?0405 08/19/21 ?0119  ?NA 137 136 135  ?K 5.0 4.7 5.0  ?CL 101 99 101  ?CO2 21* 22 20*  ?GLUCOSE 115* 106* 113*  ?BUN 60* 61* 64*  ?CREATININE 10.55* 10.59* 10.98*  ?CALCIUM 8.4* 8.4* 8.4*  ?PHOS 4.5  --   --   ? ?Liver Function Tests: ?Recent Labs  ?Lab 08/16/21 ?2035 08/17/21 ?5035 08/18/21 ?4656   ?AST 38 39  --   ?ALT 15 15  --   ?ALKPHOS 77 72  --   ?BILITOT 0.9 1.1  --   ?PROT 7.1 7.1  --   ?ALBUMIN 2.3* 2.2* 2.1*  ? ?No results for input(s): LIPASE, AMYLASE in the last 168 hours. ?No results for input(s): AMMONIA in the last 168 hours. ?CBC: ?Recent Labs  ?Lab 08/16/21 ?2035 08/17/21 ?8127 08/18/21 ?5170 08/19/21 ?0174  ?WBC 6.2 5.7 5.4 5.6  ?NEUTROABS 3.8  --  2.8  --   ?HGB 6.7* 7.7* 7.8* 7.0*  ?HCT 20.8* 24.1* 24.3* 21.8*  ?MCV 103.0* 100.4* 100.4* 100.0  ?PLT 159 151 151 142*  ? ?Cardiac Enzymes: ?No results for input(s): CKTOTAL, CKMB, CKMBINDEX, TROPONINI in the last 168 hours. ?CBG: ?Recent Labs  ?Lab 08/18/21 ?0818 08/18/21 ?1154 08/18/21 ?1707 08/18/21 ?2038 08/19/21 ?9449  ?GLUCAP 104* 119* 137* 100* 111*  ? ? ?Iron Studies: No results for input(s): IRON, TIBC, TRANSFERRIN, FERRITIN in the last 72 hours. ?Studies/Results: ?No results  found. ? ?Medications: ?Infusions: ? cefTRIAXone (ROCEPHIN)  IV 2 g (08/18/21 1711)  ? vancomycin    ? ? ?Scheduled Medications: ? amLODipine  2.5 mg Oral QHS  ? aspirin EC  81 mg Oral Daily  ? atorvastatin  40 mg Oral Daily  ? Chlorhexidine Gluconate Cloth  6 each Topical Q0600  ? Chlorhexidine Gluconate Cloth  6 each Topical Q0600  ? insulin aspart  0-6 Units Subcutaneous TID WC  ? pantoprazole  40 mg Oral Daily  ? sodium chloride flush  3 mL Intravenous Q12H  ? ? have reviewed scheduled and prn medications. ? ?Physical Exam: ?General:NAD, comfortable ?Heart:RRR, s1s2 nl ?Lungs:clear b/l, no crackle ?Abdomen:soft, Non-tender, non-distended ?Extremities:left leg stump edema+ ?Dialysis Access: LUE AVF t/b+  ? ?Daniel [name] ?08/19/2021,8:28 AM ? LOS: 3 days  ? ?

## 2021-08-19 NOTE — Discharge Summary (Signed)
Physician Discharge Summary  ?Patient ID: ?Daniel [name] ?MRN: [id number] ?DOB/AGE: Apr 30, 1960 61 y.o. ? ?Admit date: 08/16/2021 ?Discharge date: 08/19/2021 ? ?Admission Diagnoses: ? ?Discharge Diagnoses:  ?Active Problems: ?  Essential hypertension ?  DM2 (diabetes mellitus, type 2) ([place]) ?  Anemia ?  Chronic hepatitis C without hepatic coma (HCC) ?  ESRD (end stage renal disease) on dialysis [facility]) ?  PAD (peripheral artery disease) ([place]) ?  GERD (gastroesophageal reflux disease) ?  Lung mass ?  Hepatocellular carcinoma ([place]) ?  Sepsis ([place]) ?  Amputation stump infection ([place]) ? ? ?Discharged Condition: stable ? ?Hospital Course: Daniel [name] is a 61 y.o. male with medical history significant of ESRD on HD, chronic hepatitis C, PAD, anemia, GI bleed, hypertension, diabetes, GERD, CHF, status post left BKA, lung mass, concern for [facility] presenting with leg pain. ? ?Patient has had ongoing leg pain since having a fall a week ago.  He was seen for this a week ago and noted to have patellar fracture with plan for outpatient Ortho follow-up.  He has had continued pain and swelling which have actually been worsening since that time.  No purulence noted. ? ?He denies fevers, chills, chest pain, shortness of breath, abdominal pain, constipation, diarrhea, nausea, vomiting. ?  ?ED Course: Vital signs in the ED significant for fever to 100.6, respiratory rate in the teens to 20s, heart rate in the 90s to 100s, blood pressure in the 409W to 119J systolic.  Lab work-up included CMP with BUN 50 and creatinine 9.68 consistent with ESRD, glucose 168, calcium 8, albumin 2.3.  CBC with hemoglobin of 6.7 down from 9.2 on discharge.  Lactic acid normal with repeat pending.  Respiratory panel for flu and COVID pending.  Patient has been typed and screened in the ED.  Blood cultures pending.  Chest x-ray showing cardiomegaly vascular congestion and mild edema.  Left knee x-ray showing patella fracture decreased joint effusion and  status post BKA.  Patient received vancomycin and ceftriaxone in the ED as well as a dose of morphine and Tylenol.  Vascular surgery was consulted and stated they will see the patient and consider an AKA.  Patient would like to avoid additional amputation and has asked for second opinion.  Orthopedics was consulted and will see the patient.  Nephrology also consulted for dialysis in the ED". ? ?08/17/2021: Patient seen.  Patient is not a particularly good historian.  Patient tells me that he was volume overloaded prior to presentation.  Patient maintains that he was compliant with hemodialysis.  Patient still makes urine.  Patient has left upper extremity hemodialysis access.  Recent BKA done (about 4 months ago).  Minimal open wound around the stump.  Patient tells me there was some concerns about cellulitis around the stump area.  No fever or chills.  No other constitutional symptoms reported. ? ?08/18/2021: Patient seen.  For hemodialysis tomorrow.  Input from vascular and orthopedic surgery team is highly appreciated.  No further procedure as per the surgical team.  Conservative management.  Likely, patient be discharged tomorrow after hemodialysis. ? ? ?Sepsis secondary to cellulitis ?> Patient presenting with worsening leg pain, erythema, warmth in the setting of recent fall and patellar fracture.  Also in the setting of status post left BKA. ?> Knee area has worsened since previous evaluation following fall.  Imaging showed decrease in the joint effusion but outer swelling is increasing and has appearance of cellulitis. ?> Noted to have fever to 100.6 and was tachycardic  meeting criteria for sepsis.  Currently no systemic leukocytosis. ?> No IV fluid bolus due to ESRD status. ?> Concern for cellulitis and possible joint infection (though seems less likely with decreased joint effusion.).  No evidence of osteomyelitis on x-ray. ?> As per ED course, vascular surgery was consulted and will evaluate the patient but  a second opinion was also obtained with the orthopedics consult who will evaluate the patient as he would like to avoid amputation for AKA if possible. ?- Appreciate vascular surgery and orthopedic surgery recommendations ?- Monitor on telemetry ?- Continue with ceftriaxone ?- As needed pain control ?- Trend fever curve and white count ?- Follow-up blood cultures ?08/17/2021: Sepsis physiology has resolved.  Continue to antibiotics.  Patient is currently on IV vancomycin and ceftriaxone. ?08/18/2021: Continue IV antibiotics. ? ?08/19/2021: Patient has had hemodialysis today.  1 unit of blood was transfused during hemodialysis.  Patient has been optimized.  Patient will be discharged back to the care of the primary care provider, nephrology, vascular and orthopedic teams. ?  ?Anemia ?> Recent admit for GI bleed.  Now with hemoglobin 6.7 down from 9.2.  Possibly degree of delusional anemia in the setting of volume overload as below however this does not seem to explain the full drop in hemoglobin. ?> Does not report any bloody stools at this point. ?> Transfusion of 1 unit has been ordered in the ED. ?- Check FOBT, if positive will need GI consult as well ?- Follow-up repeat CBC ?- Transfuse hemoglobin less than 7 ?- 40 mg IV Lasix given volume overload, to reduce risk of TACO ?08/17/2021: We deferred today nephrology team.  Patient may benefit from IV iron and ESA. ? ?Volume overload ?CHF ?ESRD ?> Known ESRD patient on HD.  Presenting with evidence of volume overload with vascular congestion, cardiomegaly, mild edema on chest x-ray. ?> Also known history of diastolic CHF last echo in February with EF 55-60% and normal RV function. ?> No significant shortness of breath.  Edema is present. ?> Nephrology has been consulted in the ED for dialysis ?- Appreciate nephrology assistance with this patient ?- Volume management with dialysis ?- We will give a dose of Lasix now as he does make urine and will be receiving a unit of  blood ?08/17/2021: Hopefully, this will improve with hemodialysis. ?08/18/2021: Follow dialysis tomorrow. ?  ?PAD ?- Continue home aspirin and atorvastatin ?  ?Hypertension ?- Continue home amlodipine ? ?Diabetes ?- SSI ? ?GERD ?- PPI ? ?Lung mass ?Findings consistent with [facility] ?- Noted during previous admission outpatient oncology referral was placed ?  ? ?Consults: nephrology, orthopedic team, vascular surgery team. ? ?Significant Diagnostic Studies:  ?Chest x-ray reviewed: "Cardiomegaly with vascular congestion and probable mild edema". ? ?X-ray of the left knee revealed:  ?"1. Nonunited transverse patellar fracture with decreased size of ?suprapatellar effusion. No new acute findings. ?2. Status post below-knee amputation". ? ? ?Treatments: antibiotics:  ? ?Discharge Exam: ?Blood pressure (!) 184/93, pulse 79, temperature 98.6 ?F (37 ?C), temperature source Oral, resp. rate 17, height '5\' 11"'$  (1.803 m), weight 66.5 kg, SpO2 100 %. ? ? ?Disposition: Discharge disposition: 01-Home or Self Care ? ? ? ? ? ? ?Discharge Instructions   ? ? Diet - low sodium heart healthy   Complete by: As directed ?  ? Discharge wound care:   Complete by: As directed ?  ? Continue current wound care  ? Increase activity slowly   Complete by: As directed ?  ? ?  ? ?  Allergies as of 08/19/2021   ? ?   Reactions  ? Seroquel [quetiapine] Other (See Comments)  ? Tardive kinesia/dystonia  ? Dilaudid [hydromorphone Hcl] Itching, Other (See Comments)  ? Pt reports itchiness after IM injection   ? ?  ? ?  ?Medication List  ?  ? ?STOP taking these medications   ? ?Calcium Acetate 667 MG Tabs ?  ?ferrous sulfate 325 (65 FE) MG tablet ?  ?fluconazole 100 MG tablet ?Commonly known as: Diflucan ?  ?hydrOXYzine 25 MG tablet ?Commonly known as: ATARAX ?  ?loperamide 2 MG tablet ?Commonly known as: IMODIUM A-D ?  ?saccharomyces boulardii 250 MG capsule ?Commonly known as: FLORASTOR ?  ? ?  ? ?TAKE these medications   ? ?amLODipine 2.5 MG tablet ?Commonly  known as: NORVASC ?Take 1 tablet (2.5 mg total) by mouth at bedtime. ?  ?amoxicillin-clavulanate 875-125 MG tablet ?Commonly known as: Augmentin ?Take 1 tablet by mouth 2 (two) times daily for 5 days. ?  ?

## 2021-08-19 NOTE — TOC Initial Note (Signed)
Transition of Care (TOC) - Initial/Assessment Note  ? ? ?Patient Details  ?Name: Daniel [name] ?MRN: [id number] ?Date of Birth: June 11, 1960 ? ?Transition of Care (TOC) CM/SW Contact:    ?Coralee Pesa, LCSWA ?Phone Number: ?08/19/2021, 1:21 PM ? ?Clinical Narrative:                 ?CSW met with pt to discuss DC needs. Pt notes he is not from a group home, and has his own apartment. He states he has to move out of his apartment by the end of the month. Pt notes he cannot stay with any family members and there is no one he can stay with. CSW provided resources, pt frustrated with his situation. TOC will be available for any further needs. ? ?Expected Discharge Plan: Home/Self Care ?Barriers to Discharge: Continued Medical Work up ? ? ?Patient Goals and CMS Choice ?Patient states their goals for this hospitalization and ongoing recovery are:: Pt states he wants to find somewhere to go ?CMS Medicare.gov Compare Post Acute Care list provided to:: Patient ?Choice offered to / list presented to : Patient ? ?Expected Discharge Plan and Services ?Expected Discharge Plan: Home/Self Care ?  ?  ?  ?Living arrangements for the past 2 months: Apartment ?                ?  ?  ?  ?  ?  ?  ?  ?  ?  ?  ? ?Prior Living Arrangements/Services ?Living arrangements for the past 2 months: Apartment ?Lives with:: Self ?Patient language and need for interpreter reviewed:: Yes ?Do you feel safe going back to the place where you live?: Yes      ?Need for Family Participation in Patient Care: No (Comment) ?Care giver support system in place?: No (comment) ?  ?Criminal Activity/Legal Involvement Pertinent to Current Situation/Hospitalization: No - Comment as needed ? ?Activities of Daily Living ?Home Assistive Devices/Equipment: Wheelchair ?ADL Screening (condition at time of admission) ?Patient's cognitive ability adequate to safely complete daily activities?: Yes ?Is the patient deaf or have difficulty hearing?: No ?Does the patient have  difficulty seeing, even when wearing glasses/contacts?: No ?Does the patient have difficulty concentrating, remembering, or making decisions?: No ?Patient able to express need for assistance with ADLs?: Yes ?Does the patient have difficulty dressing or bathing?: Yes ?Independently performs ADLs?: No ?Does the patient have difficulty walking or climbing stairs?: No ?Weakness of Legs: Left ?Weakness of Arms/Hands: None ? ?Permission Sought/Granted ?Permission sought to share information with : Family Supports ?Permission granted to share information with : No ?   ?   ?   ?   ? ?Emotional Assessment ?Appearance:: Appears stated age ?Attitude/Demeanor/Rapport: Engaged ?Affect (typically observed): Frustrated ?Orientation: : Oriented to Self, Oriented to Place, Oriented to  Time, Oriented to Situation ?Alcohol / Substance Use: Not Applicable ?Psych Involvement: No (comment) ? ?Admission diagnosis:  Amputation stump infection ([place]) [T87.40] ?Sepsis ([place]) [A41.9] ?Sepsis, due to unspecified organism, unspecified whether acute organ dysfunction present ([place]) [A41.9] ?Patient Active Problem List  ? Diagnosis Date Noted  ? Amputation stump infection ([place])   ? Sepsis ([place]) 08/16/2021  ? Right foot pain 08/07/2021  ? Lung mass 08/06/2021  ? Hepatocellular carcinoma ([place]) 08/06/2021  ? Melena   ? Pressure injury of skin 07/05/2021  ? Right leg pain 07/04/2021  ? GI bleeding 07/04/2021  ? Acute lower GI bleeding 07/03/2021  ? Chronic diastolic CHF (congestive heart failure) ([place]) 06/14/2021  ? GERD (gastroesophageal  reflux disease) 06/14/2021  ? Chest pain 06/13/2021  ? Diabetic foot infection ([place]) 04/14/2021  ? PAD (peripheral artery disease) ([place]) 04/08/2021  ? Gastritis and gastroduodenitis   ? ESRD (end stage renal disease) on dialysis ([place]) 03/25/2021  ? Diabetes mellitus type 2, controlled, with complications ([place]) 63/84/6659  ? QT prolongation 03/25/2021  ? Alcohol abuse with intoxication ([place]) 03/25/2021  ? Cocaine abuse  ([place]) 03/25/2021  ? Tobacco use 03/25/2021  ? Class 2 obesity due to excess calories with body mass index (BMI) of 39.0 to 39.9 in adult 03/25/2021  ? AVM (arteriovenous malformation) of duodenum, acquired   ? Peptic ulcer disease   ? Upper GI bleed 03/24/2021  ? Chalazion of right upper eyelid 03/24/2021  ? Thrombocytopenia ([place]) 03/24/2021  ? Visual hallucination 03/24/2021  ? Bilateral leg edema 03/24/2021  ? Acute blood loss anemia   ? Benign neoplasm of duodenum, jejunum, and ileum   ? Anemia due to chronic kidney disease 02/23/2021  ? Prolonged QT interval 02/23/2021  ? Rotator cuff tear arthropathy of right shoulder 01/23/2021  ? Alcohol dependence ([place]) 01/13/2021  ? Gastric ulcer with hemorrhage 01/13/2021  ? Generalized weakness   ? Transaminitis 01/10/2021  ? History of alcohol abuse 10/27/2020  ? History of cocaine abuse ([place]) 10/27/2020  ? Nicotine dependence, cigarettes, uncomplicated 93/57/0177  ? Acute GI bleeding 08/10/2020  ? Duodenal ulcer with hemorrhage   ? 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 AVM (arteriovenous malformation) of small bowel, acquired with hemorrhage   ? Symptomatic anemia 07/31/2019  ? Chronic hepatitis C without hepatic coma ([place]) 07/11/2019  ? Iron deficiency anemia 03/30/2019  ? Alcohol use disorder 03/30/2019  ? B12 deficiency 03/30/2019  ? Orthostatic hypotension 01/22/2019  ? Anemia   ? Essential hypertension 06/29/2016  ? DM2 (diabetes mellitus, type 2) ([place]) 06/29/2016  ? ?PCP:  [name], NP ?Pharmacy:   ?[facility] [place] ?[address] [place] ?[place] [address] ?Phone: [phone] Fax: [phone] ? ? ? ? ?Social Determinants of Health (SDOH) Interventions ?  ? ?Readmission Risk Interventions ? ?  02/27/2021  ? 12:51 PM 07/07/2019  ?  4:03 PM  ?Readmission Risk Prevention Plan  ?Transportation Screening Complete Complete  ?PCP or Specialist Appt within 3-5 Days  Complete  ?[place] or  Home Care Consult  Complete  ?Social Work Consult for [place] Planning/Counseling  Complete  ?Palliative Care Screening  Not Applicable  ?Medication Review Press photographer) Complete Complete  ?PCP or Specialist appointment within 3-5 days of discharge Complete   ?[place] or Home Care Consult Complete   ?SW Recovery Care/Counseling Consult Complete   ?Palliative Care Screening Not Applicable   ?[place] Not Applicable   ? ? ? ?

## 2021-08-20 ENCOUNTER — Telehealth (INDEPENDENT_AMBULATORY_CARE_PROVIDER_SITE_OTHER): Payer: Self-pay | Admitting: Primary Care

## 2021-08-20 ENCOUNTER — Ambulatory Visit (INDEPENDENT_AMBULATORY_CARE_PROVIDER_SITE_OTHER)
Admit: 2021-08-20 | Discharge: 2021-08-20 | Disposition: A | Discharge status: Home / Self Care | Payer: Medicare Other | Attending: Vascular Surgery | Admitting: Vascular Surgery

## 2021-08-20 ENCOUNTER — Ambulatory Visit (INDEPENDENT_AMBULATORY_CARE_PROVIDER_SITE_OTHER): Payer: Medicare Other | Admitting: Physician Assistant

## 2021-08-20 ENCOUNTER — Encounter: Payer: Self-pay | Admitting: Physician Assistant

## 2021-08-20 ENCOUNTER — Telehealth: Payer: Self-pay

## 2021-08-20 ENCOUNTER — Ambulatory Visit: Payer: Medicare Other | Admitting: *Deleted

## 2021-08-20 ENCOUNTER — Telehealth: Payer: Self-pay | Admitting: Nephrology

## 2021-08-20 VITALS — BP 173/91 | HR 98 | Temp 98.6°F | Resp 18 | Ht 71.0 in | Wt 260.0 lb

## 2021-08-20 DIAGNOSIS — I739 Peripheral vascular disease, unspecified: Secondary | ICD-10-CM

## 2021-08-20 DIAGNOSIS — N186 End stage renal disease: Secondary | ICD-10-CM | POA: Diagnosis not present

## 2021-08-20 DIAGNOSIS — K921 Melena: Secondary | ICD-10-CM | POA: Diagnosis not present

## 2021-08-20 DIAGNOSIS — Z89512 Acquired absence of left leg below knee: Secondary | ICD-10-CM | POA: Diagnosis not present

## 2021-08-20 DIAGNOSIS — K922 Gastrointestinal hemorrhage, unspecified: Secondary | ICD-10-CM | POA: Diagnosis not present

## 2021-08-20 LAB — BPAM RBC
Blood Product Expiration Date: 202305062359
Blood Product Expiration Date: 202305122359
ISSUE DATE / TIME: 202304220014
ISSUE DATE / TIME: 202304241036
Unit Type and Rh: 7300
Unit Type and Rh: 7300

## 2021-08-20 LAB — TYPE AND SCREEN
ABO/RH(D): B POS
Antibody Screen: NEGATIVE
Unit division: 0
Unit division: 0

## 2021-08-20 MED ORDER — ASPIRIN EC 81 MG PO TBEC: 81.0000 mg | DELAYED_RELEASE_TABLET | Freq: Every day | ORAL | 11 refills | Status: DC

## 2021-08-20 MED ORDER — ATORVASTATIN CALCIUM 40 MG PO TABS: 40.0000 mg | ORAL_TABLET | Freq: Every day | ORAL | 11 refills | Status: DC

## 2021-08-20 NOTE — Telephone Encounter (Signed)
Transition of Care Contact from Inpatient Facility ? ?Date of Discharge: 08/19/21 ?Date of Contact: 08/20/21 ?Method of contact: phone - attempted ? ?Attempted to contact patient to discuss transition of care from inpatient admission.  Patient did not answer the phone.  Will follow up at dialysis. ? ?Jen Mow, PA-C ?[facility] ?Pager: [phone] ? ?

## 2021-08-20 NOTE — Progress Notes (Signed)
?Office Note  ? ? ? ?CC:  follow up ?Requesting Provider:  [name], NP ? ?HPI: Daniel [name] is a 61 y.o. (January 04, 1961) male who presents for follow up of PAD. He initially presented with bilateral foot pain in early December of 2022.  He underwent aortogram on 04/08/2021 and he did not have any revascularization options.  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  He had to have washout and debridement with wound VAC placement on 06/17/21 due to non healing stump.  ?We have subsequently been following his RLE arterial disease. He presented with rest pain. On 07/08/21 he underwent Aortogram, RLE arteriogram with SFA angioplasty and stenting by Dr. Stanford Breed. He was discharged on Aspirin, Plavix and high intensity Statin. Unfortunately he had presented to ED with GI bleed in early April and Dr. Carlis Abbott cleared him to discontinue Plavix at that time. Recommended continuation of Aspirin for stent patency. He presents today for follow up after this intervention. He reports continued tingling pain in right foot. Pain is worse at night but also occurs during the day. He describes it as feeling like his foot is asleep. He prescribed Gabapentin but he is unsure if he is taking it. He does not ambulate so unable to assess claudication symptoms. No tissue loss. He continues to smoke daily. He reports not taking Aspirin or statin at today's visit.  ? ?He additionally was just in the hospital over the weekend due to left knee pain and swelling. He has known left non displaced patella fracture that is being treated with non operative management. He is followed by Orthopedics, Dr. Doreatha Martin. He was seen by Dr. Stanford Breed in consultation and no surgical intervention was indicated on his left BKA stump. He does report phantom pains in left leg as well.  ? ?He is additionally followed for his left AV fistula. 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 He since has started using fistula.  He has had no issues. His TDC was removed in February. ? ?The pt is on a statin for cholesterol management.  ?The pt is on a daily aspirin.   Other AC:  none ?The pt is on CCB for hypertension.   ?The pt is not diabetic.   ?Tobacco hx:  Current, 1/2 ppd ? ?Past Medical History:  ?Diagnosis Date  ? Anemia   ? Diabetes mellitus without complication ([place])   ? patient denies  ? Dialysis patient [facility])   ? End stage chronic kidney disease ([place])   ? Hypertension   ? ICH (intracerebral hemorrhage) ([place]) 05/20/2017  ? PAD (peripheral artery disease) ([place])   ? Shoulder pain, left 06/28/2013  ? ? ?Past Surgical History:  ?Procedure Laterality Date  ? A/V FISTULAGRAM N/A 08/15/2020  ? Procedure: A/V FISTULAGRAM - Left Upper;  Surgeon: Cherre Robins, MD;  Location: [place] [facility];  Service: Cardiovascular;  Laterality: N/A;  ? ABDOMINAL AORTOGRAM W/LOWER EXTREMITY N/A 04/08/2021  ? Procedure: ABDOMINAL AORTOGRAM W/LOWER EXTREMITY;  Surgeon: Waynetta Sandy, MD;  Location: [place] [facility];  Service: Cardiovascular;  Laterality: N/A;  ? AMPUTATION Left 04/16/2021  ? Procedure: LEFT BELOW KNEE AMPUTATION;  Surgeon: Angelia Mould, MD;  Location: [facility] OR;  Service: Vascular;  Laterality: Left;  ? AMPUTATION Left 06/17/2021  ? Procedure: REVISION AMPUTATION BELOW KNEE;  Surgeon: Broadus John, MD;  Location: [place];  Service: Vascular;  Laterality: Left;  ? APPENDECTOMY    ? APPLICATION OF WOUND  VAC Left 06/17/2021  ? Procedure: APPLICATION OF WOUND VAC;  Surgeon: Broadus John, MD;  Location: [place];  Service: Vascular;  Laterality: Left;  ? AV FISTULA PLACEMENT Left 08/03/2019  ? Procedure: LEFT ARM ARTERIOVENOUS (AV) CEPHALIC  FISTULA CREATION;  Surgeon: Waynetta Sandy, MD;  Location: [place];  Service: Vascular;  Laterality: Left;  ? BIOPSY  06/30/2019  ? Procedure: BIOPSY;  Surgeon: Ronald Lobo, MD;  Location: [place];  Service: Endoscopy;;  ? BIOPSY  08/02/2019  ? Procedure: BIOPSY;   Surgeon: Yetta Flock, MD;  Location: [facility] ENDOSCOPY;  Service: Gastroenterology;;  ? BIOPSY  02/08/2021  ? Procedure: BIOPSY;  Surgeon: Sharyn Creamer, MD;  Location: [facility] ENDOSCOPY;  Service: Gastroenterology;;  ? BIOPSY  02/24/2021  ? Procedure: BIOPSY;  Surgeon: [name], MD;  Location: [facility] ENDOSCOPY;  Service: Gastroenterology;;  ? BIOPSY  03/26/2021  ? Procedure: BIOPSY;  Surgeon: Lavena Bullion, DO;  Location: [place] ENDOSCOPY;  Service: Gastroenterology;;  ? BIOPSY  08/06/2021  ? Procedure: BIOPSY;  Surgeon: [name], MD;  Location: [place];  Service: Gastroenterology;;  ? COLONOSCOPY  01/23/2012  ? Procedure: COLONOSCOPY;  Surgeon: Danie Binder, MD;  [facility];  Service: Endoscopy;  Laterality: N/A;  11:10 AM  ? COLONOSCOPY WITH PROPOFOL N/A 06/30/2019  ? Procedure: COLONOSCOPY WITH PROPOFOL;  Surgeon: Ronald Lobo, MD;  Location: [place];  Service: Endoscopy;  Laterality: N/A;  ? ENTEROSCOPY N/A 08/02/2019  ? Procedure: ENTEROSCOPY;  Surgeon: Yetta Flock, MD;  Location: [facility] ENDOSCOPY;  Service: Gastroenterology;  Laterality: N/A;  ? ENTEROSCOPY N/A 02/24/2021  ? Procedure: ENTEROSCOPY;  Surgeon: [name], MD;  Location: [place];  Service: Gastroenterology;  Laterality: N/A;  ? ENTEROSCOPY N/A 03/26/2021  ? Procedure: ENTEROSCOPY;  Surgeon: Lavena Bullion, DO;  Location: [place];  Service: Gastroenterology;  Laterality: N/A;  ? ENTEROSCOPY N/A 07/05/2021  ? Procedure: ENTEROSCOPY;  Surgeon: Carol Ada, MD;  Location: [place];  Service: Gastroenterology;  Laterality: N/A;  ? ENTEROSCOPY N/A 08/06/2021  ? Procedure: ENTEROSCOPY;  Surgeon: [name], MD;  Location: [place];  Service: Gastroenterology;  Laterality: N/A;  ? ESOPHAGOGASTRODUODENOSCOPY N/A 08/10/2020  ? Procedure: ESOPHAGOGASTRODUODENOSCOPY (EGD);  Surgeon: Jerene Bears, MD;  Location: [facility] ENDOSCOPY;  Service: Gastroenterology;  Laterality: N/A;  ?  ESOPHAGOGASTRODUODENOSCOPY (EGD) WITH PROPOFOL N/A 06/30/2019  ? Procedure: ESOPHAGOGASTRODUODENOSCOPY (EGD) WITH PROPOFOL;  Surgeon: Ronald Lobo, MD;  Location: [place];  Service: Endoscopy;  Laterality: N/A;  ? ESOPHAGOGASTRODUODENOSCOPY (EGD) WITH PROPOFOL N/A 01/12/2021  ? Procedure: ESOPHAGOGASTRODUODENOSCOPY (EGD) WITH PROPOFOL;  Surgeon: Sharyn Creamer, MD;  Location: [place];  Service: Gastroenterology;  Laterality: N/A;  ? ESOPHAGOGASTRODUODENOSCOPY (EGD) WITH PROPOFOL N/A 02/08/2021  ? Procedure: ESOPHAGOGASTRODUODENOSCOPY (EGD) WITH PROPOFOL;  Surgeon: Sharyn Creamer, MD;  Location: [place];  Service: Gastroenterology;  Laterality: N/A;  ? FISTULA SUPERFICIALIZATION Left 10/17/2019  ? Procedure: LEFT UPPER EXTREMITY FISTULA REVISION, SIDE BRANCH LIGATION,  AND SUPERFICIALIZATION;  Surgeon: Marty Heck, MD;  Location: [place];  Service: Vascular;  Laterality: Left;  ? FISTULA SUPERFICIALIZATION Left 04/19/2021  ? Procedure: PLICATION OF ANEURYSM LEFT ARTERIOVENOUS FISTULA;  Surgeon: Angelia Mould, MD;  Location: [place];  Service: Vascular;  Laterality: Left;  ? FLEXIBLE SIGMOIDOSCOPY N/A 07/05/2021  ? Procedure: FLEXIBLE SIGMOIDOSCOPY;  Surgeon: Carol Ada, MD;  Location: [place];  Service: Gastroenterology;  Laterality: N/A;  ? GIVENS CAPSULE STUDY N/A 06/30/2019  ? Procedure: GIVENS CAPSULE STUDY;  Surgeon:  Ronald Lobo, MD;  Location: [facility] ENDOSCOPY;  Service: Endoscopy;  Laterality: N/A;  ? HEMOSTASIS CLIP PLACEMENT  08/10/2020  ? Procedure: HEMOSTASIS CLIP PLACEMENT;  Surgeon: Jerene Bears, MD;  Location: [facility] ENDOSCOPY;  Service: Gastroenterology;;  ? HEMOSTASIS CLIP PLACEMENT  01/12/2021  ? Procedure: HEMOSTASIS CLIP PLACEMENT;  Surgeon: Sharyn Creamer, MD;  Location: [place];  Service: Gastroenterology;;  ? HEMOSTASIS CONTROL  08/02/2019  ? Procedure: HEMOSTASIS CONTROL;  Surgeon: Yetta Flock, MD;  Location: [place];  Service: Gastroenterology;;  ?  HOT HEMOSTASIS  02/24/2021  ? Procedure: HOT HEMOSTASIS (ARGON PLASMA COAGULATION/BICAP);  Surgeon: [name], MD;  Location: [facility] ENDOSCOPY;  Service: Gastroenterology;;  ? HOT HEMOSTASIS N/A 11/

## 2021-08-20 NOTE — Telephone Encounter (Signed)
Routed to PCP 

## 2021-08-20 NOTE — Telephone Encounter (Signed)
Transition Care Management Follow-up Telephone Call ?Date of discharge and from where: 08/19/2021, [facility]  ?How have you been since you were released from the hospital? He said he was just at a doctor's appointment. ?Any questions or concerns? Yes - he said he doesn't have all of his medications  that are on the list and he wants to talk to Sharyn Lull to discuss further. ? ?Items Reviewed: ?Did the pt receive and understand the discharge instructions provided? Yes  ?Medications obtained and verified?  As noted above, he said he does not have all of the medications on the list.  ?Other? No  ?Any new allergies since your discharge? No  ?Dietary orders reviewed? No ?Do you have support at home?  Lives in a rooming house.  ? ?Home Care and Equipment/Supplies: ?Were home health services ordered? No new order for home health when he was discharged.  ?If so, what is the name of the agency? N/a [name] was the agency he was referred to prior to his hospitalization.  ?Has the agency set up a time to come to the patient's home? not applicable ?Were any new equipment or medical supplies ordered?  No ?What is the name of the medical supply agency? N/a ?Were you able to get the supplies/equipment? not applicable ?Do you have any questions related to the use of the equipment or supplies? No ? ?He needs to contact [facility] to schedule a time for them to pick up the large wheelchair that was delivered.  He needs them to exchange it for a smaller one. He is currently using a wheelchair from his landlord.  ? ?He attends HD: M/W/F at [facility]  ? ?Functional Questionnaire: (I = Independent and D = Dependent) ?ADLs: He primarily uses the wheelchair for mobility. He also has a RW. Independent with personal care. ? ?Follow up appointments reviewed: ? ?PCP Hospital f/u appt confirmed? Yes  Scheduled to see [name], NP  - 09/03/2021.  ?[facility] f/u appt confirmed? Yes  - he had an appointment with [facility]  today. Oncology - 08/30/2021, pulmonary - [id number] ?Are transportation arrangements needed? No , he said he is driving.  ?If their condition worsens, is the pt aware to call PCP or go to the Emergency Dept.? Yes ?Was the patient provided with contact information for the PCP's office or ED? Yes ?Was to pt encouraged to call back with questions or concerns? Yes ? ?

## 2021-08-20 NOTE — Telephone Encounter (Signed)
Eliezer Lofts called from [facility] to report that he just came back home from the hospital yesterday.  ?She needs a Home Health Nurse to go out for resumption of care to assess and determine how to proceed with wound care. She wants to send a nurse Thursday. He also reports that he is going to lose his apartment at the end of the month. Pt is frustrated but was provided with resources from the hospital.  ? ? ?Best contact: [phone] ?

## 2021-08-20 NOTE — Patient Outreach (Signed)
Received a hospital discharge referral from Natividad Brood, [facility] Liaison. ?I have assigned Deloria Lair, NP to call for follow up and determine if there are any Case Management needs.  ?  ?Arville Care, CBCS, CMAA ?[place] Management Assistant ?[place] [facility] ?[phone]   ?

## 2021-08-21 ENCOUNTER — Telehealth (INDEPENDENT_AMBULATORY_CARE_PROVIDER_SITE_OTHER): Payer: Self-pay | Admitting: Primary Care

## 2021-08-21 ENCOUNTER — Other Ambulatory Visit: Payer: Self-pay | Admitting: *Deleted

## 2021-08-21 ENCOUNTER — Other Ambulatory Visit: Payer: Self-pay

## 2021-08-21 LAB — CULTURE, BLOOD (ROUTINE X 2)
Culture: NO GROWTH
Culture: NO GROWTH
Special Requests: ADEQUATE

## 2021-08-21 NOTE — Telephone Encounter (Signed)
Copied from [place] [id number]. Topic: Quick Communication - Home Health Verbal Orders ?>> Aug 21, 2021 12:04 PM Leward Quan A wrote: ?Caller/Agency: [facility] // [place]  ?Callback Number: [phone] ok to LM  ?Requesting OT/PT/Skilled Nursing/Social Work/Speech Therapy: Skilled nursing ?Frequency: 1w8 ?

## 2021-08-21 NOTE — Progress Notes (Signed)
The proposed treatment discussed in conference is for discussion purpose only and is not a binding recommendation.  The patients have not been physically examined, or presented with their treatment options.  Therefore, final treatment plans cannot be decided.  

## 2021-08-21 NOTE — Telephone Encounter (Signed)
Copied from [place]. Topic: General - Other ?>> Aug 21, 2021 12:07 PM Leward Quan A wrote: ?Reason for CRM: Glenard Haring with [name] called in ask if Ms Oletta Lamas can please send an Rx to patient pharmacy for a glucometer and supplies not sure which one his insurance will cover. ?

## 2021-08-21 NOTE — Telephone Encounter (Signed)
Verbals provided to [place]. ?

## 2021-08-21 NOTE — Telephone Encounter (Signed)
Routed to PCP 

## 2021-08-22 ENCOUNTER — Other Ambulatory Visit (INDEPENDENT_AMBULATORY_CARE_PROVIDER_SITE_OTHER): Payer: Self-pay | Admitting: Primary Care

## 2021-08-22 DIAGNOSIS — E118 Type 2 diabetes mellitus with unspecified complications: Secondary | ICD-10-CM

## 2021-08-22 MED ORDER — BLOOD GLUCOSE MONITOR KIT: PACK | 0 refills | Status: DC

## 2021-08-22 NOTE — Telephone Encounter (Signed)
Called Jenna for V.O. unavailable CMA can provide V.O when call is return ?

## 2021-08-23 ENCOUNTER — Other Ambulatory Visit: Payer: Self-pay

## 2021-08-23 ENCOUNTER — Inpatient Hospital Stay (HOSPITAL_COMMUNITY)
Admission: EM | Admit: 2021-08-23 | Discharge: 2021-08-27 | DRG: 377 | Disposition: A | Discharge status: Home / Self Care | Payer: Medicare Other | Attending: Internal Medicine | Admitting: Internal Medicine

## 2021-08-23 ENCOUNTER — Encounter (HOSPITAL_COMMUNITY): Payer: Self-pay | Admitting: Emergency Medicine

## 2021-08-23 DIAGNOSIS — Z6834 Body mass index (BMI) 34.0-34.9, adult: Secondary | ICD-10-CM

## 2021-08-23 DIAGNOSIS — M546 Pain in thoracic spine: Secondary | ICD-10-CM | POA: Diagnosis present

## 2021-08-23 DIAGNOSIS — D62 Acute posthemorrhagic anemia: Secondary | ICD-10-CM | POA: Diagnosis present

## 2021-08-23 DIAGNOSIS — B182 Chronic viral hepatitis C: Secondary | ICD-10-CM | POA: Diagnosis present

## 2021-08-23 DIAGNOSIS — R918 Other nonspecific abnormal finding of lung field: Secondary | ICD-10-CM | POA: Diagnosis present

## 2021-08-23 DIAGNOSIS — I10 Essential (primary) hypertension: Secondary | ICD-10-CM | POA: Diagnosis present

## 2021-08-23 DIAGNOSIS — K2100 Gastro-esophageal reflux disease with esophagitis, without bleeding: Secondary | ICD-10-CM

## 2021-08-23 DIAGNOSIS — A048 Other specified bacterial intestinal infections: Secondary | ICD-10-CM

## 2021-08-23 DIAGNOSIS — N186 End stage renal disease: Secondary | ICD-10-CM | POA: Diagnosis present

## 2021-08-23 DIAGNOSIS — E669 Obesity, unspecified: Secondary | ICD-10-CM | POA: Diagnosis present

## 2021-08-23 DIAGNOSIS — K922 Gastrointestinal hemorrhage, unspecified: Secondary | ICD-10-CM | POA: Diagnosis present

## 2021-08-23 DIAGNOSIS — Z992 Dependence on renal dialysis: Secondary | ICD-10-CM

## 2021-08-23 DIAGNOSIS — K5520 Angiodysplasia of colon without hemorrhage: Secondary | ICD-10-CM | POA: Diagnosis present

## 2021-08-23 DIAGNOSIS — E1151 Type 2 diabetes mellitus with diabetic peripheral angiopathy without gangrene: Secondary | ICD-10-CM | POA: Diagnosis present

## 2021-08-23 DIAGNOSIS — K921 Melena: Secondary | ICD-10-CM | POA: Diagnosis present

## 2021-08-23 DIAGNOSIS — E1169 Type 2 diabetes mellitus with other specified complication: Secondary | ICD-10-CM | POA: Diagnosis not present

## 2021-08-23 DIAGNOSIS — T50905A Adverse effect of unspecified drugs, medicaments and biological substances, initial encounter: Secondary | ICD-10-CM | POA: Diagnosis present

## 2021-08-23 DIAGNOSIS — F17210 Nicotine dependence, cigarettes, uncomplicated: Secondary | ICD-10-CM | POA: Diagnosis present

## 2021-08-23 DIAGNOSIS — K208 Other esophagitis without bleeding: Secondary | ICD-10-CM | POA: Diagnosis present

## 2021-08-23 DIAGNOSIS — Z7982 Long term (current) use of aspirin: Secondary | ICD-10-CM

## 2021-08-23 DIAGNOSIS — K2970 Gastritis, unspecified, without bleeding: Secondary | ICD-10-CM | POA: Diagnosis present

## 2021-08-23 DIAGNOSIS — K766 Portal hypertension: Secondary | ICD-10-CM | POA: Diagnosis present

## 2021-08-23 DIAGNOSIS — F17200 Nicotine dependence, unspecified, uncomplicated: Secondary | ICD-10-CM | POA: Diagnosis present

## 2021-08-23 DIAGNOSIS — Z8673 Personal history of transient ischemic attack (TIA), and cerebral infarction without residual deficits: Secondary | ICD-10-CM

## 2021-08-23 DIAGNOSIS — I739 Peripheral vascular disease, unspecified: Secondary | ICD-10-CM | POA: Diagnosis present

## 2021-08-23 DIAGNOSIS — E440 Moderate protein-calorie malnutrition: Secondary | ICD-10-CM | POA: Diagnosis present

## 2021-08-23 DIAGNOSIS — I8510 Secondary esophageal varices without bleeding: Secondary | ICD-10-CM | POA: Diagnosis present

## 2021-08-23 DIAGNOSIS — M549 Dorsalgia, unspecified: Secondary | ICD-10-CM | POA: Diagnosis not present

## 2021-08-23 DIAGNOSIS — Z720 Tobacco use: Secondary | ICD-10-CM | POA: Diagnosis not present

## 2021-08-23 DIAGNOSIS — D500 Iron deficiency anemia secondary to blood loss (chronic): Secondary | ICD-10-CM | POA: Diagnosis present

## 2021-08-23 DIAGNOSIS — K3189 Other diseases of stomach and duodenum: Secondary | ICD-10-CM | POA: Diagnosis present

## 2021-08-23 DIAGNOSIS — E1122 Type 2 diabetes mellitus with diabetic chronic kidney disease: Secondary | ICD-10-CM | POA: Diagnosis present

## 2021-08-23 DIAGNOSIS — C220 Liver cell carcinoma: Secondary | ICD-10-CM | POA: Diagnosis present

## 2021-08-23 DIAGNOSIS — I8511 Secondary esophageal varices with bleeding: Secondary | ICD-10-CM | POA: Diagnosis not present

## 2021-08-23 DIAGNOSIS — Z89512 Acquired absence of left leg below knee: Secondary | ICD-10-CM

## 2021-08-23 DIAGNOSIS — E119 Type 2 diabetes mellitus without complications: Secondary | ICD-10-CM

## 2021-08-23 DIAGNOSIS — I120 Hypertensive chronic kidney disease with stage 5 chronic kidney disease or end stage renal disease: Secondary | ICD-10-CM | POA: Diagnosis present

## 2021-08-23 DIAGNOSIS — D649 Anemia, unspecified: Secondary | ICD-10-CM | POA: Diagnosis not present

## 2021-08-23 DIAGNOSIS — K7460 Unspecified cirrhosis of liver: Secondary | ICD-10-CM | POA: Diagnosis present

## 2021-08-23 DIAGNOSIS — M545 Low back pain, unspecified: Secondary | ICD-10-CM | POA: Diagnosis present

## 2021-08-23 DIAGNOSIS — B1920 Unspecified viral hepatitis C without hepatic coma: Secondary | ICD-10-CM | POA: Diagnosis present

## 2021-08-23 DIAGNOSIS — Z888 Allergy status to other drugs, medicaments and biological substances status: Secondary | ICD-10-CM

## 2021-08-23 DIAGNOSIS — K317 Polyp of stomach and duodenum: Secondary | ICD-10-CM | POA: Diagnosis present

## 2021-08-23 DIAGNOSIS — Z79899 Other long term (current) drug therapy: Secondary | ICD-10-CM

## 2021-08-23 DIAGNOSIS — Z885 Allergy status to narcotic agent status: Secondary | ICD-10-CM

## 2021-08-23 LAB — COMPREHENSIVE METABOLIC PANEL
ALT: 21 U/L (ref 0–44)
AST: 62 U/L — ABNORMAL HIGH (ref 15–41)
Albumin: 2.1 g/dL — ABNORMAL LOW (ref 3.5–5.0)
Alkaline Phosphatase: 93 U/L (ref 38–126)
Anion gap: 12 (ref 5–15)
BUN: 35 mg/dL — ABNORMAL HIGH (ref 8–23)
CO2: 24 mmol/L (ref 22–32)
Calcium: 9.1 mg/dL (ref 8.9–10.3)
Chloride: 104 mmol/L (ref 98–111)
Creatinine, Ser: 7.97 mg/dL — ABNORMAL HIGH (ref 0.61–1.24)
GFR, Estimated: 7 mL/min — ABNORMAL LOW (ref >60)
Glucose, Bld: 109 mg/dL — ABNORMAL HIGH (ref 70–99)
Potassium: 4.3 mmol/L (ref 3.5–5.1)
Sodium: 140 mmol/L (ref 135–145)
Total Bilirubin: 0.6 mg/dL (ref 0.3–1.2)
Total Protein: 7.5 g/dL (ref 6.5–8.1)

## 2021-08-23 LAB — HEMOGLOBIN AND HEMATOCRIT, BLOOD
HCT: 25.4 % — ABNORMAL LOW (ref 39.0–52.0)
Hemoglobin: 8.5 g/dL — ABNORMAL LOW (ref 13.0–17.0)

## 2021-08-23 LAB — CBC
HCT: 23.4 % — ABNORMAL LOW (ref 39.0–52.0)
Hemoglobin: 7.4 g/dL — ABNORMAL LOW (ref 13.0–17.0)
MCH: 32.2 pg (ref 26.0–34.0)
MCHC: 31.6 g/dL (ref 30.0–36.0)
MCV: 101.7 fL — ABNORMAL HIGH (ref 80.0–100.0)
Platelets: 159 10*3/uL (ref 150–400)
RBC: 2.3 MIL/uL — ABNORMAL LOW (ref 4.22–5.81)
RDW: 19.6 % — ABNORMAL HIGH (ref 11.5–15.5)
WBC: 5.3 10*3/uL (ref 4.0–10.5)
nRBC: 0 % (ref 0.0–0.2)

## 2021-08-23 LAB — PREPARE RBC (CROSSMATCH)

## 2021-08-23 LAB — HIV ANTIBODY (ROUTINE TESTING W REFLEX): HIV Screen 4th Generation wRfx: NONREACTIVE

## 2021-08-23 LAB — POC OCCULT BLOOD, ED: Fecal Occult Bld: POSITIVE — AB

## 2021-08-23 LAB — SEDIMENTATION RATE: Sed Rate: 130 mm/hr — ABNORMAL HIGH (ref 0–16)

## 2021-08-23 LAB — C-REACTIVE PROTEIN: CRP: 1.3 mg/dL — ABNORMAL HIGH (ref <1.0)

## 2021-08-23 MED ORDER — SODIUM CHLORIDE 0.9% FLUSH
3.0000 mL | Freq: Two times a day (BID) | INTRAVENOUS | Status: DC
  Administered 2021-08-23 – 2021-08-27 (×9): 3 mL via INTRAVENOUS

## 2021-08-23 MED ORDER — GABAPENTIN 100 MG PO CAPS
100.0000 mg | ORAL_CAPSULE | Freq: Three times a day (TID) | ORAL | Status: DC
  Administered 2021-08-23 – 2021-08-27 (×13): 100 mg via ORAL
  Filled 2021-08-23 (×13): qty 1

## 2021-08-23 MED ORDER — PENTAFLUOROPROP-TETRAFLUOROETH EX AERO: 1.0000 "application " | INHALATION_SPRAY | CUTANEOUS | Status: DC | PRN

## 2021-08-23 MED ORDER — PANTOPRAZOLE SODIUM 40 MG IV SOLR
40.0000 mg | Freq: Two times a day (BID) | INTRAVENOUS | Status: DC
Start: 2021-08-26 — End: 2021-08-24

## 2021-08-23 MED ORDER — DARBEPOETIN ALFA 60 MCG/0.3ML IJ SOSY: 60.0000 ug | PREFILLED_SYRINGE | INTRAMUSCULAR | Status: DC

## 2021-08-23 MED ORDER — ACETAMINOPHEN 325 MG PO TABS
650.0000 mg | ORAL_TABLET | Freq: Four times a day (QID) | ORAL | Status: DC | PRN
  Administered 2021-08-23: 650 mg via ORAL
  Filled 2021-08-23: qty 2

## 2021-08-23 MED ORDER — SEVELAMER CARBONATE 800 MG PO TABS
800.0000 mg | ORAL_TABLET | Freq: Three times a day (TID) | ORAL | Status: DC
  Administered 2021-08-23 – 2021-08-27 (×9): 800 mg via ORAL
  Filled 2021-08-23 (×10): qty 1

## 2021-08-23 MED ORDER — LIDOCAINE HCL (PF) 1 % IJ SOLN: 5.0000 mL | INTRAMUSCULAR | Status: DC | PRN

## 2021-08-23 MED ORDER — ALBUTEROL SULFATE (2.5 MG/3ML) 0.083% IN NEBU: 2.5000 mg | INHALATION_SOLUTION | Freq: Four times a day (QID) | RESPIRATORY_TRACT | Status: DC | PRN

## 2021-08-23 MED ORDER — NICOTINE 21 MG/24HR TD PT24
21.0000 mg | MEDICATED_PATCH | Freq: Every day | TRANSDERMAL | Status: DC
Start: 2021-08-23 — End: 2021-08-27
  Administered 2021-08-23 – 2021-08-27 (×5): 21 mg via TRANSDERMAL
  Filled 2021-08-23 (×5): qty 1

## 2021-08-23 MED ORDER — PANTOPRAZOLE INFUSION (NEW) - SIMPLE MED
8.0000 mg/h | INTRAVENOUS | Status: DC
  Administered 2021-08-23 (×2): 8 mg/h via INTRAVENOUS
  Filled 2021-08-23: qty 80
  Filled 2021-08-23: qty 100
  Filled 2021-08-23: qty 80
  Filled 2021-08-23: qty 100

## 2021-08-23 MED ORDER — SODIUM CHLORIDE 0.9 % IV SOLN: 100.0000 mL | INTRAVENOUS | Status: DC | PRN

## 2021-08-23 MED ORDER — CALCIUM ACETATE (PHOS BINDER) 667 MG/5ML PO SOLN
667.0000 mg | Freq: Three times a day (TID) | ORAL | Status: DC
  Filled 2021-08-23: qty 5

## 2021-08-23 MED ORDER — ATORVASTATIN CALCIUM 40 MG PO TABS
40.0000 mg | ORAL_TABLET | Freq: Every day | ORAL | Status: DC
  Administered 2021-08-24 – 2021-08-27 (×4): 40 mg via ORAL
  Filled 2021-08-23 (×4): qty 1

## 2021-08-23 MED ORDER — ACETAMINOPHEN 650 MG RE SUPP: 650.0000 mg | Freq: Four times a day (QID) | RECTAL | Status: DC | PRN

## 2021-08-23 MED ORDER — HYDRALAZINE HCL 20 MG/ML IJ SOLN
10.0000 mg | INTRAMUSCULAR | Status: DC | PRN
Start: 2021-08-23 — End: 2021-08-27

## 2021-08-23 MED ORDER — AMLODIPINE BESYLATE 5 MG PO TABS
2.5000 mg | ORAL_TABLET | Freq: Every day | ORAL | Status: DC
  Administered 2021-08-23 – 2021-08-27 (×5): 2.5 mg via ORAL
  Filled 2021-08-23 (×5): qty 1

## 2021-08-23 MED ORDER — SODIUM THIOSULFATE 250 MG/ML IV SOLN
25.0000 g | INTRAVENOUS | Status: DC
  Administered 2021-08-23 – 2021-08-26 (×2): 25 g via INTRAVENOUS
  Filled 2021-08-23 (×2): qty 100

## 2021-08-23 MED ORDER — HYDROCODONE-ACETAMINOPHEN 5-325 MG PO TABS
1.0000 | ORAL_TABLET | Freq: Four times a day (QID) | ORAL | Status: DC | PRN
  Administered 2021-08-23 – 2021-08-24 (×5): 1 via ORAL
  Filled 2021-08-23 (×5): qty 1

## 2021-08-23 MED ORDER — LIDOCAINE-PRILOCAINE 2.5-2.5 % EX CREA: 1.0000 "application " | TOPICAL_CREAM | CUTANEOUS | Status: DC | PRN

## 2021-08-23 MED ORDER — PANTOPRAZOLE 80MG IVPB - SIMPLE MED
80.0000 mg | Freq: Once | INTRAVENOUS | Status: AC
  Administered 2021-08-23: 80 mg via INTRAVENOUS
  Filled 2021-08-23: qty 80

## 2021-08-23 MED ORDER — SODIUM CHLORIDE 0.9 % IV SOLN
10.0000 mL/h | Freq: Once | INTRAVENOUS | Status: AC
  Administered 2021-08-23: 10 mL/h via INTRAVENOUS

## 2021-08-23 MED ORDER — CHLORHEXIDINE GLUCONATE CLOTH 2 % EX PADS
6.0000 | MEDICATED_PAD | Freq: Every day | CUTANEOUS | Status: DC
  Administered 2021-08-24 – 2021-08-26 (×3): 6 via TOPICAL

## 2021-08-23 NOTE — ED Notes (Addendum)
Patient transferred from bed 25 to 10. I unit PRBCs infusing through right AC. Patient denies any needs at this time.  ?

## 2021-08-23 NOTE — ED Provider Notes (Addendum)
?[place] ?Provider Note ? ? ?CSN: [id number] ?Arrival date & time: 08/23/21  3546 ? ?  ? ?History ? ?Chief Complaint  ?Patient presents with  ? Rectal Bleeding  ? ? ?Daniel [name] is a 61 y.o. male. ? ?Pt with hx esrd/hd MWF, gi bleed (hx esophagitis, gastritis, duodenal ulcer, avms, and diverticulosis) c/o black stools, general weakness.  States black stools returned in past couple days. Denies abd pain. No vomiting. No fever or chills. Denies anticoag use. ? ?The history is provided by medical records, the EMS personnel and the patient.  ?Rectal Bleeding ?Associated symptoms: no abdominal pain, no fever and no vomiting   ? ?  ? ?Home Medications ?Prior to Admission medications   ?Medication Sig Start Date End Date Taking? Authorizing Provider  ?amLODipine (NORVASC) 2.5 MG tablet Take 1 tablet (2.5 mg total) by mouth at bedtime. 08/10/21   Bonnielee Haff, MD  ?amoxicillin-clavulanate (AUGMENTIN) 875-125 MG tablet Take 1 tablet by mouth 2 (two) times daily for 5 days. 08/19/21 08/24/21  Dana Allan I, MD  ?aspirin 81 MG EC tablet Take 1 tablet (81 mg total) by mouth daily. Swallow whole. 08/10/21   Bonnielee Haff, MD  ?aspirin EC 81 MG tablet Take 1 tablet (81 mg total) by mouth daily. Swallow whole. 08/20/21   Baglia, Corrina, PA-C  ?atorvastatin (LIPITOR) 40 MG tablet Take 1 tablet (40 mg total) by mouth daily. 08/20/21   Baglia, Corrina, PA-C  ?blood glucose meter kit and supplies KIT Dispense based on patient and insurance preference. Use up to four times daily as directed. (FOR ICD-9 250.00, 250.01). 08/22/21   [name], NP  ?Darbepoetin Alfa (ARANESP) 100 MCG/0.5ML SOSY injection Inject 0.5 mLs (100 mcg total) into the vein every 7 (seven) days for 8 doses. 08/19/21 10/08/21  Bonnell Public, MD  ?doxycycline (VIBRAMYCIN) 50 MG capsule Take 2 capsules (100 mg total) by mouth 2 (two) times daily for 5 days. 08/19/21 08/24/21  Bonnell Public, MD   ?gabapentin (NEURONTIN) 100 MG capsule Take 1 capsule (100 mg total) by mouth 3 (three) times daily. 08/10/21   Bonnielee Haff, MD  ?HYDROcodone-acetaminophen (NORCO/VICODIN) 5-325 MG tablet Take 1 tablet by mouth every 6 (six) hours as needed for severe pain. 08/10/21   Bonnielee Haff, MD  ?Multiple Vitamin (MULTIVITAMIN WITH MINERALS) TABS tablet Take 1 tablet by mouth every morning.    [provider]  ?pantoprazole (PROTONIX) 40 MG tablet Take 1 tablet (40 mg total) by mouth daily. 08/10/21   Bonnielee Haff, MD  ?colchicine 0.6 MG tablet Take 0.5 tablets (0.3 mg total) by mouth 2 (two) times daily. 07/24/20 07/29/20  Noemi Chapel, MD  ?furosemide (LASIX) 40 MG tablet Take 1 tablet (40 mg total) by mouth daily. 07/25/19 02/09/20  Ladona Horns, MD  ?   ? ?Allergies    ?Seroquel [quetiapine] and Dilaudid [hydromorphone hcl]   ? ?Review of Systems   ?Review of Systems  ?Constitutional:  Negative for fever.  ?HENT:  Negative for sore throat.   ?Eyes:  Negative for redness.  ?Respiratory:  Negative for shortness of breath.   ?Cardiovascular:  Negative for chest pain.  ?Gastrointestinal:  Positive for blood in stool and hematochezia. Negative for abdominal pain and vomiting.  ?Genitourinary:  Negative for flank pain and hematuria.  ?Musculoskeletal:  Positive for back pain. Negative for neck pain.  ?     States chronic mid to upper back pain - denies recent change or injury  ?  Skin:  Negative for rash.  ?Neurological:  Positive for weakness. Negative for numbness and headaches.  ?Hematological:  Does not bruise/bleed easily.  ?Psychiatric/Behavioral:  Negative for confusion.   ? ?Physical Exam ?Updated Vital Signs ?BP (!) 183/95 (BP Location: Right Wrist)   Pulse 95   Temp 98.7 ?F (37.1 ?C) (Oral)   Resp (!) 24   Wt 115.7 kg   SpO2 99%   BMI 35.57 kg/m?  ?Physical Exam ?Vitals and nursing note reviewed.  ?Constitutional:   ?   Appearance: Normal appearance. He is well-developed.  ?HENT:  ?   Head:  Atraumatic.  ?   Nose: Nose normal.  ?   Mouth/Throat:  ?   Mouth: Mucous membranes are moist.  ?   Pharynx: Oropharynx is clear.  ?Eyes:  ?   General: No scleral icterus. ?   Conjunctiva/sclera: Conjunctivae normal.  ?Neck:  ?   Trachea: No tracheal deviation.  ?Cardiovascular:  ?   Rate and Rhythm: Normal rate and regular rhythm.  ?   Pulses: Normal pulses.  ?   Heart sounds: Normal heart sounds. No murmur heard. ?  No friction rub. No gallop.  ?Pulmonary:  ?   Effort: Pulmonary effort is normal. No accessory muscle usage or respiratory distress.  ?   Breath sounds: Normal breath sounds.  ?Abdominal:  ?   General: Bowel sounds are normal. There is no distension.  ?   Palpations: Abdomen is soft.  ?   Tenderness: There is no abdominal tenderness. There is no guarding.  ?Genitourinary: ?   Comments: No cva tenderness. Black stool,  sent for hemoccult.  ?Musculoskeletal:     ?   General: No swelling.  ?   Cervical back: Normal range of motion and neck supple. No rigidity.  ?   Comments: LUE av fistula w palp thrill. T/L/S spine non tender, aligned.   ?Skin: ?   General: Skin is warm and dry.  ?   Findings: No rash.  ?Neurological:  ?   Mental Status: He is alert.  ?   Comments: Alert, speech clear.   ?Psychiatric:     ?   Mood and Affect: Mood normal.  ? ? ?ED Results / Procedures / Treatments   ?Labs ?(all labs ordered are listed, but only abnormal results are displayed) ?Results for orders placed or performed during the hospital encounter of 08/23/21  ?CBC  ?Result Value Ref Range  ? WBC 5.3 4.0 - 10.5 K/uL  ? RBC 2.30 (L) 4.22 - 5.81 MIL/uL  ? Hemoglobin 7.4 (L) 13.0 - 17.0 g/dL  ? HCT 23.4 (L) 39.0 - 52.0 %  ? MCV 101.7 (H) 80.0 - 100.0 fL  ? MCH 32.2 26.0 - 34.0 pg  ? MCHC 31.6 30.0 - 36.0 g/dL  ? RDW 19.6 (H) 11.5 - 15.5 %  ? Platelets 159 150 - 400 K/uL  ? nRBC 0.0 0.0 - 0.2 %  ?Comprehensive metabolic panel  ?Result Value Ref Range  ? Sodium 140 135 - 145 mmol/L  ? Potassium 4.3 3.5 - 5.1 mmol/L  ? Chloride  104 98 - 111 mmol/L  ? CO2 24 22 - 32 mmol/L  ? Glucose, Bld 109 (H) 70 - 99 mg/dL  ? BUN 35 (H) 8 - 23 mg/dL  ? Creatinine, Ser 7.97 (H) 0.61 - 1.24 mg/dL  ? Calcium 9.1 8.9 - 10.3 mg/dL  ? Total Protein 7.5 6.5 - 8.1 g/dL  ? Albumin 2.1 (L) 3.5 - 5.0 g/dL  ?  AST 62 (H) 15 - 41 U/L  ? ALT 21 0 - 44 U/L  ? Alkaline Phosphatase 93 38 - 126 U/L  ? Total Bilirubin 0.6 0.3 - 1.2 mg/dL  ? GFR, Estimated 7 (L) >60 mL/min  ? Anion gap 12 5 - 15  ?POC occult blood, ED Provider will collect  ?Result Value Ref Range  ? Fecal Occult Bld POSITIVE (A) NEGATIVE  ?Type and screen [place]  ?Result Value Ref Range  ? ABO/RH(D) B POS   ? Antibody Screen NEG   ? Sample Expiration    ?  08/26/2021,2359 ?Performed at [facility], [place] [address] [place], [place] [address] ?  ? Unit Number [id number]   ? Blood Component Type RED CELLS,LR   ? Unit division 00   ? Status of Unit ALLOCATED   ? Transfusion Status OK TO TRANSFUSE   ? Crossmatch Result Compatible   ?Prepare RBC (crossmatch)  ?Result Value Ref Range  ? Order Confirmation    ?  ORDER PROCESSED BY BLOOD BANK ?Performed at [facility], [place] [address] [place], [place] [address] ?  ?BPAM RBC  ?Result Value Ref Range  ? Blood Product Unit Number [id number]   ? PRODUCT CODE X2820S13   ? Unit Type and Rh 7300   ? Blood Product Expiration Date [id number]   ? ?DG Pelvis 1-2 Views ? ?Result Date: 08/11/2021 ?CLINICAL DATA:  [place] EXAM: PELVIS - 1-2 VIEW COMPARISON:  None. FINDINGS: Frontal view of the pelvis includes both hips. No acute fracture. Alignment is anatomic. Mild symmetrical bilateral hip joint space narrowing. Diffuse atherosclerosis. IMPRESSION: 1. Mild osteoarthritis.  No acute fracture. Electronically Signed   By: Randa Ngo M.D.   On: 08/11/2021 22:32  ? ?DG Chest Port 1 View ? ?Result Date: 08/16/2021 ?CLINICAL DATA:  Possible sepsis EXAM: PORTABLE CHEST 1 VIEW COMPARISON:  07/03/2021 FINDINGS: Cardiomegaly with mild central  congestion and probable low-grade edema. No pleural effusion or pneumothorax. Aortic atherosclerosis. IMPRESSION: Cardiomegaly with vascular congestion and probable mild edema. Electronically Signed   By: Madie Reno.D

## 2021-08-23 NOTE — H&P (Addendum)
?History and Physical  ? ? ?Patient: Daniel [name] BJY:[id number] DOB: [birth year] ?DOA: 08/23/2021 ?DOS: the patient was seen and examined on 08/23/2021 ?PCP: [name], NP  ?Patient coming from: Home ? ?Chief Complaint:  ?Chief Complaint  ?Patient presents with  ? Rectal Bleeding  ? ?HPI: Daniel [name] is a 61 y.o. male with medical history significant of ESRD on HD(MWF), hypertension, CHF, PAD, anemia, GI bleed, left BKA, chronic hepatitis C with concern cirrhosis, GERD with esophagitis, and concern for hepatocellular carcinoma who presents with complaints of dark black stools for last 2 to 3 days.  He reports having anywhere from 3-4 black stools per day on average.  Notes associated symptoms of lower abdominal pain, heartburn, and generalized weakness.  Denies having any significant fever or vomiting. ? ?Patient had just been hospitalized from 4/21-4/24 with sepsis secondary to cellulitis treated with IV vancomycin and Rocephin, GI bleed with positive stool guaiac transfused 1 unit packed red blood cells after hemoglobin dropped down to 6.7 g/dL, fluid overload treated with HD.  Discharged home on Augmentin and doxycycline, but is not totally clear if patient was taking these. Hospitalized from 4/10-4/15 due to GI bleeding.  Patient had underwent EGD with GI showing severe esophagitis without bleeding, gastritis, and multiple duodenal polyps which were friable. ? ? ?Upon admission to the emergency department patient was seen to be afebrile with respirations 11-24, blood pressures 142/50 - 183/95, and O2 saturations maintained on room air.  Labs significant for hemoglobin 7.4, potassium 4.3, BUN 35, creatinine 7.97, albumin 2.1, and AST 62.  Patient has been given 80 mg of Protonix IV.  Stool guaiacs were noted to be positive.  Patient has been ordered to be transfused 1 unit of packed red blood cells after being typed and screened.  GI have been formally consulted.  Patient had been given  Protonix 80 mg IV x1 dose.  TRH called to admit ?Review of Systems: As mentioned in the history of present illness. All other systems reviewed and are negative. ?Past Medical History:  ?Diagnosis Date  ? Anemia   ? Diabetes mellitus without complication ([place])   ? patient denies  ? Dialysis patient [facility])   ? End stage chronic kidney disease ([place])   ? Hypertension   ? ICH (intracerebral hemorrhage) ([place]) 05/20/2017  ? PAD (peripheral artery disease) ([place])   ? Shoulder pain, left 06/28/2013  ? ?Past Surgical History:  ?Procedure Laterality Date  ? A/V FISTULAGRAM N/A 08/15/2020  ? Procedure: A/V FISTULAGRAM - Left Upper;  Surgeon: Cherre Robins, MD;  Location: [facility];  Service: Cardiovascular;  Laterality: N/A;  ? ABDOMINAL AORTOGRAM W/LOWER EXTREMITY N/A 04/08/2021  ? Procedure: ABDOMINAL AORTOGRAM W/LOWER EXTREMITY;  Surgeon: Waynetta Sandy, MD;  Location: [place] [facility];  Service: Cardiovascular;  Laterality: N/A;  ? AMPUTATION Left 04/16/2021  ? Procedure: LEFT BELOW KNEE AMPUTATION;  Surgeon: Angelia Mould, MD;  Location: [facility] OR;  Service: Vascular;  Laterality: Left;  ? AMPUTATION Left 06/17/2021  ? Procedure: REVISION AMPUTATION BELOW KNEE;  Surgeon: Broadus John, MD;  Location: [place];  Service: Vascular;  Laterality: Left;  ? APPENDECTOMY    ? APPLICATION OF WOUND VAC Left 06/17/2021  ? Procedure: APPLICATION OF WOUND VAC;  Surgeon: Broadus John, MD;  Location: [place];  Service: Vascular;  Laterality: Left;  ? AV FISTULA PLACEMENT Left 08/03/2019  ? Procedure: LEFT ARM ARTERIOVENOUS (AV) CEPHALIC  FISTULA CREATION;  Surgeon: Waynetta Sandy,  MD;  Location: [place];  Service: Vascular;  Laterality: Left;  ? BIOPSY  06/30/2019  ? Procedure: BIOPSY;  Surgeon: Ronald Lobo, MD;  Location: [place];  Service: Endoscopy;;  ? BIOPSY  08/02/2019  ? Procedure: BIOPSY;  Surgeon: Yetta Flock, MD;  Location: [facility] ENDOSCOPY;  Service: Gastroenterology;;  ? BIOPSY   02/08/2021  ? Procedure: BIOPSY;  Surgeon: Sharyn Creamer, MD;  Location: [facility] ENDOSCOPY;  Service: Gastroenterology;;  ? BIOPSY  02/24/2021  ? Procedure: BIOPSY;  Surgeon: [name], MD;  Location: [facility] ENDOSCOPY;  Service: Gastroenterology;;  ? BIOPSY  03/26/2021  ? Procedure: BIOPSY;  Surgeon: Lavena Bullion, DO;  Location: [place] ENDOSCOPY;  Service: Gastroenterology;;  ? BIOPSY  08/06/2021  ? Procedure: BIOPSY;  Surgeon: [name], MD;  Location: [place];  Service: Gastroenterology;;  ? COLONOSCOPY  01/23/2012  ? Procedure: COLONOSCOPY;  Surgeon: Danie Binder, MD;  [facility];  Service: Endoscopy;  Laterality: N/A;  11:10 AM  ? COLONOSCOPY WITH PROPOFOL N/A 06/30/2019  ? Procedure: COLONOSCOPY WITH PROPOFOL;  Surgeon: Ronald Lobo, MD;  Location: [place];  Service: Endoscopy;  Laterality: N/A;  ? ENTEROSCOPY N/A 08/02/2019  ? Procedure: ENTEROSCOPY;  Surgeon: Yetta Flock, MD;  Location: [facility] ENDOSCOPY;  Service: Gastroenterology;  Laterality: N/A;  ? ENTEROSCOPY N/A 02/24/2021  ? Procedure: ENTEROSCOPY;  Surgeon: [name], MD;  Location: [place];  Service: Gastroenterology;  Laterality: N/A;  ? ENTEROSCOPY N/A 03/26/2021  ? Procedure: ENTEROSCOPY;  Surgeon: Lavena Bullion, DO;  Location: [place];  Service: Gastroenterology;  Laterality: N/A;  ? ENTEROSCOPY N/A 07/05/2021  ? Procedure: ENTEROSCOPY;  Surgeon: Carol Ada, MD;  Location: [place];  Service: Gastroenterology;  Laterality: N/A;  ? ENTEROSCOPY N/A 08/06/2021  ? Procedure: ENTEROSCOPY;  Surgeon: [name], MD;  Location: [place];  Service: Gastroenterology;  Laterality: N/A;  ? ESOPHAGOGASTRODUODENOSCOPY N/A 08/10/2020  ? Procedure: ESOPHAGOGASTRODUODENOSCOPY (EGD);  Surgeon: Jerene Bears, MD;  Location: [facility] ENDOSCOPY;  Service: Gastroenterology;  Laterality: N/A;  ? ESOPHAGOGASTRODUODENOSCOPY (EGD) WITH PROPOFOL N/A 06/30/2019  ? Procedure:  ESOPHAGOGASTRODUODENOSCOPY (EGD) WITH PROPOFOL;  Surgeon: Ronald Lobo, MD;  Location: [place];  Service: Endoscopy;  Laterality: N/A;  ? ESOPHAGOGASTRODUODENOSCOPY (EGD) WITH PROPOFOL N/A 01/12/2021  ? Procedure: ESOPHAGOGASTRODUODENOSCOPY (EGD) WITH PROPOFOL;  Surgeon: Sharyn Creamer, MD;  Location: [place];  Service: Gastroenterology;  Laterality: N/A;  ? ESOPHAGOGASTRODUODENOSCOPY (EGD) WITH PROPOFOL N/A 02/08/2021  ? Procedure: ESOPHAGOGASTRODUODENOSCOPY (EGD) WITH PROPOFOL;  Surgeon: Sharyn Creamer, MD;  Location: [place];  Service: Gastroenterology;  Laterality: N/A;  ? FISTULA SUPERFICIALIZATION Left 10/17/2019  ? Procedure: LEFT UPPER EXTREMITY FISTULA REVISION, SIDE BRANCH LIGATION,  AND SUPERFICIALIZATION;  Surgeon: Marty Heck, MD;  Location: [place];  Service: Vascular;  Laterality: Left;  ? FISTULA SUPERFICIALIZATION Left 04/19/2021  ? Procedure: PLICATION OF ANEURYSM LEFT ARTERIOVENOUS FISTULA;  Surgeon: Angelia Mould, MD;  Location: [place];  Service: Vascular;  Laterality: Left;  ? FLEXIBLE SIGMOIDOSCOPY N/A 07/05/2021  ? Procedure: FLEXIBLE SIGMOIDOSCOPY;  Surgeon: Carol Ada, MD;  Location: [place];  Service: Gastroenterology;  Laterality: N/A;  ? GIVENS CAPSULE STUDY N/A 06/30/2019  ? Procedure: GIVENS CAPSULE STUDY;  Surgeon: Ronald Lobo, MD;  Location: [facility];  Service: Endoscopy;  Laterality: N/A;  ? HEMOSTASIS CLIP PLACEMENT  08/10/2020  ? Procedure: HEMOSTASIS CLIP PLACEMENT;  Surgeon: Jerene Bears, MD;  Location: [facility] ENDOSCOPY;  Service: Gastroenterology;;  ? HEMOSTASIS CLIP PLACEMENT  01/12/2021  ? Procedure:  HEMOSTASIS CLIP PLACEMENT;  Surgeon: Sharyn Creamer, MD;  Location: [place];  Service: Gastroenterology;;  ? HEMOSTASIS CONTROL  08/02/2019  ? Procedure: HEMOSTASIS CONTROL;  Surgeon: Yetta Flock, MD;  Location: [place];  Service: Gastroenterology;;  ? HOT HEMOSTASIS  02/24/2021  ? Procedure: HOT HEMOSTASIS (ARGON PLASMA  COAGULATION/BICAP);  Surgeon: [name], MD;  Location: [facility] ENDOSCOPY;  Service: Gastroenterology;;  ? HOT HEMOSTASIS N/A 03/26/2021  ? Procedure: HOT HEMOSTASIS (ARGON PLASMA COAGULATION/BICAP);  Surgeon: Lavena Bullion, DO

## 2021-08-23 NOTE — Consult Note (Addendum)
? ?                                            Consultation Note ? ? ?Referring Provider: [facility] ?PCP: [name], NP ?Primary Gastroenterologist: Althia Forts  ?Reason for consultation: GI bleed, anemia  ?Hospital Day: 1 ? ?ASSESSMENT:  ? ?Recurrent GI bleeding on plavix / acute on chronic anemia. Enteroscopy on 4/11 with findings of severe non-bleeding esophagitis, friable gastritis and friable hyperplastic duodenal polyps.  Starting having black stools again a week ago. Bleeding possible from gastritis, duodenal polyps. Also possibly AVMs though none seen on recent enteroscopy ? ?Erosive esophagitis ?Found on recent upper endoscopy. He takes prescribed medications but doesn't know what they are. Pantoprazole is on home med list ? ?Cirrhosis with portal hypertension and recent findings of hepatocellular carcinoma on MRI. No evidence for distant metastasis. He being followed outpatient by Oncology. His treatment options are being considered.  ? ?ESRD on HD MWF.  ? ?See PMH for additional medical problems ? ? ?PLAN:  ?Will plan for repeat enteroscopy tomorrow. Interventions may be limited since he has been on Plavix.  If enteroscopy negative then may need small bowel video capsule study.  ?Blood transfusion is in progress ?PPI infusion already ordered.  ?Keep HOB elevate at least 30 degrees due to esophagitis ? ? ? ?Attending Physician Note  ? ?I have taken a history, reviewed the chart and examined the patient. I performed more than 50% of this encounter in conjunction with the APP. I agree with the APP's note, impression and recommendations with my edits. My additional impressions and recommendations are as follows.  ? ?*Recurrent GI bleeding, melena, with ABL on chronic anemia. Pt on Plavix which has been held since his hospitalization earlier this month. Bleeding duodenal polyps, gastritis, erosive esophagitis are potential sources. Consider small bowel AVMs however no AVMs noted on recent  push enteroscopy.  ?*Cirrhosis, portal hypertension, Hep C, suspected HCC on MRI ? ?Continue to hold Plavix ?Continue pantoprazole infusion  ?Transfusions to maintain Hb > 7-8 ?Push enteroscopy tomorrow. ? ?Lucio Edward, MD [facility] ?See [facility], [facility], for our on call provider  ? ? ? ?History of Present Illness:  ?Daniel [name] is a 61 y.o. male a past medical history significant for hypertension, DM, PAD s/p right SFA stenting 06/2021 on plavix, intracerebral hemorrhage, ESRD on dialysis, chronic anemia, diverticulosis, recurrent GI bleeds  / duodenal ulcer  / small bowel AVMs, erosive esophagitis, hyperplastic duodenal polyps   See PMH for any additional medical problems. ? ? ?Patient has a history of recurrent GI bleeding and has undergone multiple endoscopic procedures. He was hospitalized earlier this month with black stools and worsening anemia. Small bowel enteroscopy on 4/11 showing mildly severe esophagitis without bleeding, possibly pill esophagitis, friable gastric mucosa, multiple friable duodenal polyps ( biopsied and hyperplastic).  He was subsequently readmitted a few days ago for leg pain after a fall. He was admitted with sepsis / cellulitis of leg.  Hgb noted to be down again, he got additional blood transfusions. He has required several units of blood since Feb 2023.  ? ? ?Interval history  ?Patient presented to ED yesterday for evaluation of  back pain and dark stools.   He had liquid dark stool in ED. Hgb was 9.1 on 4/24 ( following blood transfusion). In ED it has declined again to 7.4.  Stool FOBT+. .He tells me that he started having black stools again a couple of days ago but then changes that to a week ago. His stools have been more frequent since turning black. No nausea / vomiting or upper abdominal discomfort. He for the last several days has been having generalized lower abdominal discomfort.  The discomfort has no relationship to bowel movements or eating.  ? ?Previous GI  Evaluation / History   ? ?Small bowel enteroscopy  08/06/2021: ?- Mildly severe esophagitis with no bleeding. Biopsied. Given localized circumferential ?inflammation, suspect pill esophagitis. ?- Gastritis. ?- Friable gastric mucosa. Biopsied. ?- Multiple duodenal polyps. Biopsied. These are most likely reactive/hyperplastic, not ?adenomatous. These polyps were friable with contact oozing, and thus may be a source of ?GI bleeding. No AVMs were seen on this examination. ?- The examined portion of the jejunum was normal. ? ?FINAL MICROSCOPIC DIAGNOSIS:  ? ?A. DUODENUM, POLYPECTOMY:  ?Hyperplastic polyps with nonspecific inflammation in the stroma.  ?There are no diagnostic features of celiac disease.  ?Negative for adenomatous change, dysplasia and malignancy.  ? ?B. PYLORIS, POLYPECTOMY:  ?Benign hyperplastic gastric polyp with focal erosion of the surface  ?epithelium.  ?H. pylori are not identified.  ?Negative for dysplasia and malignancy.  ? ?C. ESOPHAGUS, BIOPSY:  ?Acute erosive esophagitis.  ?Microorganisms with morphologic features consistent with Candida species  ?are present.  Negative for dysplasia and malignancy.  ? ?Small bowel enteroscopy by Dr. Benson Norway 07/05/2021: ?- Normal esophagus. ?- Friable gastric mucosa. ?- A few recently bleeding angioectasias in the duodenum. Treated with a monopolar probe. ?- A single non-bleeding angiodysplastic lesion in the jejunum. Treated with a monopolar ?probe. ?- No specimens collected. ?  ?Flexible sigmoidoscopy 07/05/2021: ?- Blood in the rectum, in the recto-sigmoid colon and in the sigmoid colon. ?- No specimens collected. ?-Abdominal/pelvic CT angiogram was recommended if he developed a rapid lower GI bleeding. ?  ?Small bowel enteroscopy by Dr. Bryan Lemma 03/26/2021: ?-Normal esophagus.  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 ?-Mild, nonulcerative gastritis.  Biopsied. ?-Mild inflammation with nodularity in the duodenal bulb.  This overall appeared  improved from images on the prior studies. ?-2 nonbleeding angiectasia's in the duodenum.  Treated with argon plasma coagulation. ?  ?EGD 08/10/2020: ?- Normal esophagus. ?- Gastritis. ?- Bleeding duodenal ?- Bleeding duodenal ulcer with a visible vessel. Clips were placed x 3 with apparent ?hemostasis. ?- Duodenitis with nodularity and polypoid mucosa. ?- No specimens collected. ?  ?Small bowel enteroscopy 08/02/2019: ?- Esophagogastric landmarks identified. ?- Normal esophagus. ?- Normal stomach. ?- Prominent ampulla without overt adenomatous changes. Biopsied. ?- A few benign appearing duodenal polyps. Biopsied, one suspected AVM treated as above. ?- A single non-bleeding angiodysplastic lesion in the duodenum. Treated with argon plasma ?coagulation ([facility]). ?- A single non-bleeding angiodysplastic lesion in the proximal jejunum. Treated with argon ?plasma coagulation ?  ?EGD 06/30/2019: ?- No source of iron deficiency anemia or heme positive stoIonlp eantideonstcopically evident. ?- Normal larynx. ?- Normal esophagus. ?- Normal stomach. ?- Duodenal polyp vs. prominent papilla, benign-appearing. Biopsied. ?  ?Colonoscopy 06/30/2019: ?- No active bleeding or source of heme positive stool/anemia seen. ?- Diverticulosis in the sigmoid colon. ?- Probable old blood in the terminal ileum, raising the question of a small bowel source (given negative egd). ?- The distal rectum and anal verge are normal on retroflexion view. ?- No specimens collected. ?  ? ? ?Recent Labs and Imaging ?MR ABDOMEN MRCP W WO CONTAST ? ?Result Date: 08/07/2021 ?CLINICAL DATA:  Liver  mass, history of hepatitis-C EXAM: MRI ABDOMEN WITHOUT AND WITH CONTRAST (INCLUDING MRCP) TECHNIQUE: Multiplanar multisequence MR imaging of the abdomen was performed both before and after the administration of intravenous contrast. Heavily T2-weighted images of the biliary and pancreatic ducts were obtained, and three-dimensional MRCP images were rendered by post  processing. CONTRAST:  1m GADAVIST GADOBUTROL 1 MMOL/ML IV SOLN COMPARISON:  CT abdomen and pelvis 08/05/2021 FINDINGS: Study is significantly limited due to motion. Lower chest: Small bilateral pleural effusions. Hepa

## 2021-08-23 NOTE — Anesthesia Preprocedure Evaluation (Addendum)
Anesthesia Evaluation  ?Patient identified by MRN, date of birth, ID band ?Patient awake ? ? ? ?Reviewed: ?Allergy & Precautions, NPO status , Patient's Chart, lab work & pertinent test results ? ?Airway ?Mallampati: III ? ?TM Distance: >3 FB ?Neck ROM: Full ? ? ? Dental ? ?(+) Missing, Dental Advisory Given ?  ?Pulmonary ?Current Smoker and Patient abstained from smoking.,  ?  ?breath sounds clear to auscultation ? ? ? ? ? ? Cardiovascular ?hypertension, Pt. on medications ?+ Peripheral Vascular Disease and +CHF  ? ?Rhythm:Regular Rate:Normal ? ? ?  ?Neuro/Psych ?  ? GI/Hepatic ?PUD, GERD  Medicated,(+) Hepatitis -  ?Endo/Other  ?diabetes ? Renal/GU ?ESRF and DialysisRenal disease  ? ?  ?Musculoskeletal ? ? Abdominal ?C/o abdominal pain.   ?Peds ? Hematology ?  ?Anesthesia Other Findings ? ? Reproductive/Obstetrics ? ?  ? ? ? ? ? ? ? ? ? ? ? ? ? ?  ?  ? ? ? ? ? ? ? ?Anesthesia Physical ?Anesthesia Plan ? ?ASA: 3 ? ?Anesthesia Plan: MAC  ? ?Post-op Pain Management:   ? ?Induction: Intravenous ? ?PONV Risk Score and Plan: 0 and Propofol infusion and Ondansetron ? ?Airway Management Planned: Natural Airway and Simple Face Mask ? ?Additional Equipment: None ? ?Intra-op Plan:  ? ?Post-operative Plan:  ? ?Informed Consent: I have reviewed the patients History and Physical, chart, labs and discussed the procedure including the risks, benefits and alternatives for the proposed anesthesia with the patient or authorized representative who has indicated his/her understanding and acceptance.  ? ? ? ?Dental advisory given ? ?Plan Discussed with: CRNA ? ?Anesthesia Plan Comments:   ? ? ? ? ? ?Anesthesia Quick Evaluation ? ?

## 2021-08-23 NOTE — ED Notes (Signed)
MD at bedside. 

## 2021-08-23 NOTE — ED Triage Notes (Signed)
Presents via [facility] for mid-back pain (sudden onset at 10p) and dark stools starting at 10p.  ?Endorses SOB. Denies dizziness ?Has been treated for GI bleed in past ans has received blood previously ? ?Dialysis MWF, fistula R arm, states he has not missed any tx this week ? ?EMS V/S: ?190/78, 90bpm, 90% ?CBG 140 with EMS. ?L BKA ?

## 2021-08-23 NOTE — ED Notes (Signed)
Patient had small, liquid, dark colored stool.  ?

## 2021-08-23 NOTE — ED Notes (Signed)
Report given to Ronalee Belts, [place]. Patient to go to bed 37 after returning from dialysis. ?

## 2021-08-23 NOTE — ED Notes (Signed)
Patient transported to dialysis

## 2021-08-23 NOTE — Plan of Care (Signed)

## 2021-08-23 NOTE — H&P (View-Only) (Signed)
? ?                                            Consultation Note ? ? ?Referring Provider: [facility] ?PCP: [name], NP ?Primary Gastroenterologist: Althia Forts  ?Reason for consultation: GI bleed, anemia  ?Hospital Day: 1 ? ?ASSESSMENT:  ? ?Recurrent GI bleeding on plavix / acute on chronic anemia. Enteroscopy on 4/11 with findings of severe non-bleeding esophagitis, friable gastritis and friable hyperplastic duodenal polyps.  Starting having black stools again a week ago. Bleeding possible from gastritis, duodenal polyps. Also possibly AVMs though none seen on recent enteroscopy ? ?Erosive esophagitis ?Found on recent upper endoscopy. He takes prescribed medications but doesn't know what they are. Pantoprazole is on home med list ? ?Cirrhosis with portal hypertension and recent findings of hepatocellular carcinoma on MRI. No evidence for distant metastasis. He being followed outpatient by Oncology. His treatment options are being considered.  ? ?ESRD on HD MWF.  ? ?See PMH for additional medical problems ? ? ?PLAN:  ?Will plan for repeat enteroscopy tomorrow. Interventions may be limited since he has been on Plavix.  If enteroscopy negative then may need small bowel video capsule study.  ?Blood transfusion is in progress ?PPI infusion already ordered.  ?Keep HOB elevate at least 30 degrees due to esophagitis ? ? ? ?Attending Physician Note  ? ?I have taken a history, reviewed the chart and examined the patient. I performed more than 50% of this encounter in conjunction with the APP. I agree with the APP's note, impression and recommendations with my edits. My additional impressions and recommendations are as follows.  ? ?Recurrent GI bleeding, melena, with ABL on chronic anemia. Pt maintained on Plavix. Bleeding duodenal polyps, gastritis, erosive esophagitis are potential sources. Consider small bowel AVMs however no AVMs noted on recent push enteroscopy.  ? ?Hold Plavix ?Continue pantoprazole  infusion  ?Transfusions to maintain Hb > 7-8 ?Push enteroscopy tomorrow. ? ?Lucio Edward, MD [facility] ?See [facility], [facility], for our on call provider  ? ? ? ?History of Present Illness:  ?Daniel [name] is a 61 y.o. male a past medical history significant for hypertension, DM, PAD s/p right SFA stenting 06/2021 on plavix, intracerebral hemorrhage, ESRD on dialysis, chronic anemia, diverticulosis, recurrent GI bleeds  / duodenal ulcer  / small bowel AVMs, erosive esophagitis, hyperplastic duodenal polyps   See PMH for any additional medical problems. ? ? ?Patient has a history of recurrent GI bleeding and has undergone multiple endoscopic procedures. He was hospitalized earlier this month with black stools and worsening anemia. Small bowel enteroscopy on 4/11 showing mildly severe esophagitis without bleeding, possibly pill esophagitis, friable gastric mucosa, multiple friable duodenal polyps ( biopsied and hyperplastic).  He was subsequently readmitted a few days ago for leg pain after a fall. He was admitted with sepsis / cellulitis of leg.  Hgb noted to be down again, he got additional blood transfusions. He has required several units of blood since Feb 2023.  ? ? ?Interval history  ?Patient presented to ED yesterday for evaluation of  back pain and dark stools.   He had liquid dark stool in ED. Hgb was 9.1 on 4/24 ( following blood transfusion). In ED it has declined again to 7.4. Stool FOBT+. .He tells me that he started having black stools again a couple of days ago but then changes  that to a week ago. His stools have been more frequent since turning black. No nausea / vomiting or upper abdominal discomfort. He for the last several days has been having generalized lower abdominal discomfort.  The discomfort has no relationship to bowel movements or eating.  ? ?Previous GI Evaluation / History   ? ?Small bowel enteroscopy  08/06/2021: ?- Mildly severe esophagitis with no bleeding. Biopsied. Given localized  circumferential ?inflammation, suspect pill esophagitis. ?- Gastritis. ?- Friable gastric mucosa. Biopsied. ?- Multiple duodenal polyps. Biopsied. These are most likely reactive/hyperplastic, not ?adenomatous. These polyps were friable with contact oozing, and thus may be a source of ?GI bleeding. No AVMs were seen on this examination. ?- The examined portion of the jejunum was normal. ? ?FINAL MICROSCOPIC DIAGNOSIS:  ? ?A. DUODENUM, POLYPECTOMY:  ?Hyperplastic polyps with nonspecific inflammation in the stroma.  ?There are no diagnostic features of celiac disease.  ?Negative for adenomatous change, dysplasia and malignancy.  ? ?B. PYLORIS, POLYPECTOMY:  ?Benign hyperplastic gastric polyp with focal erosion of the surface  ?epithelium.  ?H. pylori are not identified.  ?Negative for dysplasia and malignancy.  ? ?C. ESOPHAGUS, BIOPSY:  ?Acute erosive esophagitis.  ?Microorganisms with morphologic features consistent with Candida species  ?are present.  Negative for dysplasia and malignancy.  ? ?Small bowel enteroscopy by Dr. Benson Norway 07/05/2021: ?- Normal esophagus. ?- Friable gastric mucosa. ?- A few recently bleeding angioectasias in the duodenum. Treated with a monopolar probe. ?- A single non-bleeding angiodysplastic lesion in the jejunum. Treated with a monopolar ?probe. ?- No specimens collected. ?  ?Flexible sigmoidoscopy 07/05/2021: ?- Blood in the rectum, in the recto-sigmoid colon and in the sigmoid colon. ?- No specimens collected. ?-Abdominal/pelvic CT angiogram was recommended if he developed a rapid lower GI bleeding. ?  ?Small bowel enteroscopy by Dr. Bryan Lemma 03/26/2021: ?-Normal esophagus.  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 ?-Mild, nonulcerative gastritis.  Biopsied. ?-Mild inflammation with nodularity in the duodenal bulb.  This overall appeared improved from images on the prior studies. ?-2 nonbleeding angiectasia's in the duodenum.  Treated with argon plasma coagulation. ?   ?EGD 08/10/2020: ?- Normal esophagus. ?- Gastritis. ?- Bleeding duodenal ?- Bleeding duodenal ulcer with a visible vessel. Clips were placed x 3 with apparent ?hemostasis. ?- Duodenitis with nodularity and polypoid mucosa. ?- No specimens collected. ?  ?Small bowel enteroscopy 08/02/2019: ?- Esophagogastric landmarks identified. ?- Normal esophagus. ?- Normal stomach. ?- Prominent ampulla without overt adenomatous changes. Biopsied. ?- A few benign appearing duodenal polyps. Biopsied, one suspected AVM treated as above. ?- A single non-bleeding angiodysplastic lesion in the duodenum. Treated with argon plasma ?coagulation (APC). ?- A single non-bleeding angiodysplastic lesion in the proximal jejunum. Treated with argon ?plasma coagulation ?  ?EGD 06/30/2019: ?- No source of iron deficiency anemia or heme positive stoIonlp eantideonstcopically evident. ?- Normal larynx. ?- Normal esophagus. ?- Normal stomach. ?- Duodenal polyp vs. prominent papilla, benign-appearing. Biopsied. ?  ?Colonoscopy 06/30/2019: ?- No active bleeding or source of heme positive stool/anemia seen. ?- Diverticulosis in the sigmoid colon. ?- Probable old blood in the terminal ileum, raising the question of a small bowel source (given negative egd). ?- The distal rectum and anal verge are normal on retroflexion view. ?- No specimens collected. ?  ? ? ?Recent Labs and Imaging ?MR ABDOMEN MRCP W WO CONTAST ? ?Result Date: 08/07/2021 ?CLINICAL DATA:  Liver mass, history of hepatitis-C EXAM: MRI ABDOMEN WITHOUT AND WITH CONTRAST (INCLUDING MRCP) TECHNIQUE: Multiplanar multisequence MR imaging of the  abdomen was performed both before and after the administration of intravenous contrast. Heavily T2-weighted images of the biliary and pancreatic ducts were obtained, and three-dimensional MRCP images were rendered by post processing. CONTRAST:  63m GADAVIST GADOBUTROL 1 MMOL/ML IV SOLN COMPARISON:  CT abdomen and pelvis 08/05/2021 FINDINGS: Study is  significantly limited due to motion. Lower chest: Small bilateral pleural effusions. Hepatobiliary: Liver is enlarged measuring 21.3 cm in length with diffuse nodular contour consistent with cirrhosis. There is a 2 cm

## 2021-08-23 NOTE — Progress Notes (Signed)
[facility] ?NEPHROLOGY PROGRESS NOTE ? ?Assessment/ Plan: ?Pt is a 61 y.o. yo male  w/ hx of DM2, anemia, eSRD on HD, HTN, ICH, PAD, sp L BKA, GIB, who was recently admitted for leg cellulitis presented back for GI bleed and anemia. ? ? OP HD: SW MWF ? 4h 450/500  115.5kg  2/2 bath  LUA AVF   Hep none ? - 4/12 > HBsAg neg w/ Abs > 10/ protective ? - last HD 4.21 post 117kg ? - Hb 7.2 on 4/21, tsat 11%, ferr 67, pth 165 ? - last mircera 225 ug on 4/05 ? - Na thio 25 gm each HD ? ? ?#Recurrent GI bleed/acute on chronic anemia: Received a unit of blood transfusion in ER.  GI is following and plan for  endoscopy.  He received Aranesp this week, we will continue weekly Aranesp.  Monitor hemoglobin and transfuse as needed. ? ?# ESRD MWF: Had around 2.5 hours of outpatient HD on 4/26.  We will plan for regular HD today. ? 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  Avoid oral calcium or VDRF because of history of calciphylaxis.  DC PhosLo and start Renvela. ? ?# HTN/volume: on norvasc, vol up on exam, UF as tolerated.  ? ?# Calciphylaxis, ? 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  ? ?Subjective: Seen and examined in ER.  Patient was receiving blood transfusion.  He reports dark-colored stool and back pain.  Denies nausea, vomiting, chest pain or shortness of breath. ?Objective ?Vital signs in last 24 hours: ?Vitals:  ? 08/23/21 0740 08/23/21 0800 08/23/21 0848 08/23/21 1030  ?BP: (!) 165/79 (!) 182/79 (!) 165/73 (!) 143/67  ?Pulse: 93 85 93 88  ?Resp: '11 16 14 16  '$ ?Temp:   98 ?F (36.7 ?C)   ?TempSrc:   Oral   ?SpO2: 97% 97% 98% 94%  ?Weight:      ? ?Weight change:  ? ?Intake/Output Summary (Last 24 hours) at 08/23/2021 1120 ?Last data filed at 08/23/2021 0848 ?Gross per 24 hour  ?Intake 305.33 ml  ?Output --  ?Net 305.33 ml  ? ? ? ? ? ? ?Labs: ?Basic Metabolic Panel: ?Recent Labs  ?Lab 08/18/21 ?9485 08/18/21 ?0405 08/19/21 ?0119 08/23/21 ?4627  ?NA 137  136 135 140  ?K 5.0 4.7 5.0 4.3  ?CL 101 99 101 104  ?CO2 21* 22 20* 24  ?GLUCOSE 115* 106* 113* 109*  ?BUN 60* 61* 64* 35*  ?CREATININE 10.55* 10.59* 10.98* 7.97*  ?CALCIUM 8.4* 8.4* 8.4* 9.1  ?PHOS 4.5  --  5.2*  --   ? ? ?Liver Function Tests: ?Recent Labs  ?Lab 08/16/21 ?2035 08/17/21 ?0350 08/18/21 ?0938 08/23/21 ?1829  ?AST 38 39  --  62*  ?ALT 15 15  --  21  ?ALKPHOS 77 72  --  93  ?BILITOT 0.9 1.1  --  0.6  ?PROT 7.1 7.1  --  7.5  ?ALBUMIN 2.3* 2.2* 2.1* 2.1*  ? ? ?No results for input(s): LIPASE, AMYLASE in the last 168 hours. ?No results for input(s): AMMONIA in the last 168 hours. ?CBC: ?Recent Labs  ?Lab 08/16/21 ?2035 08/17/21 ?9371 08/18/21 ?6967 08/19/21 ?8938 08/19/21 ?1330 08/23/21 ?1017  ?WBC 6.2 5.7 5.4 5.6  --  5.3  ?NEUTROABS 3.8  --  2.8  --   --   --   ?HGB 6.7* 7.7* 7.8* 7.0* 9.1* 7.4*  ?HCT 20.8* 24.1* 24.3* 21.8* 27.2* 23.4*  ?MCV 103.0* 100.4*  100.4* 100.0  --  101.7*  ?PLT 159 151 151 142*  --  159  ? ? ?Cardiac Enzymes: ?No results for input(s): CKTOTAL, CKMB, CKMBINDEX, TROPONINI in the last 168 hours. ?CBG: ?Recent Labs  ?Lab 08/18/21 ?1154 08/18/21 ?1707 08/18/21 ?2038 08/19/21 ?9163 08/19/21 ?1239  ?GLUCAP 119* 137* 100* 111* 115*  ? ? ? ?Iron Studies: No results for input(s): IRON, TIBC, TRANSFERRIN, FERRITIN in the last 72 hours. ?Studies/Results: ?No results found. ? ?Medications: ?Infusions: ? pantoprazole 8 mg/hr (08/23/21 0905)  ? ? ?Scheduled Medications: ? amLODipine  2.5 mg Oral Daily  ? [START ON 08/24/2021] atorvastatin  40 mg Oral Daily  ? calcium acetate (Phos Binder)  667 mg Oral TID WC  ? Chlorhexidine Gluconate Cloth  6 each Topical Q0600  ? gabapentin  100 mg Oral TID  ? [START ON 08/26/2021] pantoprazole  40 mg Intravenous Q12H  ? sodium chloride flush  3 mL Intravenous Q12H  ? ? have reviewed scheduled and prn medications. ? ?Physical Exam: ?General: Looks comfortable, able to lie flat ?Heart: Regular rate rhythm, S1-S2 normal. ?Lungs: Clear bilateral, no  crackle ?Abdomen:soft, Non-tender, non-distended ?Extremities:left leg stump has edema present ?Dialysis Access: Left upper extremity AV fistula has thrill and bruit. ? ?[place] ?08/23/2021,11:20 AM ? LOS: 0 days  ? ?

## 2021-08-23 NOTE — ED Notes (Signed)
Blood transfusion completed.

## 2021-08-23 NOTE — Patient Outreach (Signed)
[facility]) Care Management ? ?08/23/2021 ? ?Daniel [name] ?Aug 26, 1960 ?[id number] ? ?Initial outreach for [place] Management servcies unsuccessful, left a message and requested a return call. ? ?[name] Daanya Lanphier, MSN, GNP-BC ?Gerontological Nurse Practitioner ?[facility] Care Management ?[phone] ? ? ?

## 2021-08-23 NOTE — Progress Notes (Signed)
removed 2833ms net fluid unable to remove more due to cramping, gave sodium thiosulfate last hour as ordered. gave hydrocodone for pain.  pre bp 150/87 post bp 149/87  unable to obtain weights on patient he is on a stretcher from ed.  2 bandages to lua avf no bleeding dressing cdi. ?

## 2021-08-24 ENCOUNTER — Inpatient Hospital Stay (HOSPITAL_COMMUNITY): Payer: Medicare Other

## 2021-08-24 ENCOUNTER — Encounter (HOSPITAL_COMMUNITY): Payer: Self-pay | Admitting: Internal Medicine

## 2021-08-24 ENCOUNTER — Inpatient Hospital Stay (HOSPITAL_COMMUNITY): Payer: Medicare Other | Admitting: Anesthesiology

## 2021-08-24 ENCOUNTER — Encounter (HOSPITAL_COMMUNITY)
Admission: EM | Disposition: A | Discharge status: Home / Self Care | Payer: Self-pay | Source: Home / Self Care | Attending: Internal Medicine

## 2021-08-24 DIAGNOSIS — I8511 Secondary esophageal varices with bleeding: Secondary | ICD-10-CM

## 2021-08-24 DIAGNOSIS — K317 Polyp of stomach and duodenum: Secondary | ICD-10-CM

## 2021-08-24 DIAGNOSIS — K3189 Other diseases of stomach and duodenum: Secondary | ICD-10-CM

## 2021-08-24 DIAGNOSIS — K922 Gastrointestinal hemorrhage, unspecified: Secondary | ICD-10-CM

## 2021-08-24 DIAGNOSIS — I739 Peripheral vascular disease, unspecified: Secondary | ICD-10-CM | POA: Diagnosis not present

## 2021-08-24 DIAGNOSIS — K766 Portal hypertension: Secondary | ICD-10-CM

## 2021-08-24 DIAGNOSIS — D649 Anemia, unspecified: Secondary | ICD-10-CM | POA: Diagnosis not present

## 2021-08-24 DIAGNOSIS — M546 Pain in thoracic spine: Secondary | ICD-10-CM

## 2021-08-24 DIAGNOSIS — I8510 Secondary esophageal varices without bleeding: Secondary | ICD-10-CM

## 2021-08-24 HISTORY — PX: ENTEROSCOPY: SHX5533

## 2021-08-24 HISTORY — PX: SUBMUCOSAL TATTOO INJECTION: SHX6856

## 2021-08-24 LAB — GLUCOSE, CAPILLARY
Glucose-Capillary: 78 mg/dL (ref 70–99)
Glucose-Capillary: 106 mg/dL — ABNORMAL HIGH (ref 70–99)
Glucose-Capillary: 82 mg/dL (ref 70–99)

## 2021-08-24 LAB — CBC
HCT: 24.7 % — ABNORMAL LOW (ref 39.0–52.0)
Hemoglobin: 8.1 g/dL — ABNORMAL LOW (ref 13.0–17.0)
MCH: 32.1 pg (ref 26.0–34.0)
MCHC: 32.8 g/dL (ref 30.0–36.0)
MCV: 98 fL (ref 80.0–100.0)
Platelets: 145 10*3/uL — ABNORMAL LOW (ref 150–400)
RBC: 2.52 MIL/uL — ABNORMAL LOW (ref 4.22–5.81)
RDW: 20.3 % — ABNORMAL HIGH (ref 11.5–15.5)
WBC: 5.6 10*3/uL (ref 4.0–10.5)
nRBC: 0.4 % — ABNORMAL HIGH (ref 0.0–0.2)

## 2021-08-24 LAB — TYPE AND SCREEN
ABO/RH(D): B POS
Antibody Screen: NEGATIVE
Unit division: 0
Unit division: 0

## 2021-08-24 LAB — BPAM RBC
Blood Product Expiration Date: 202305032359
Blood Product Expiration Date: 202305122359
ISSUE DATE / TIME: 202304280703
ISSUE DATE / TIME: 202304280937
Unit Type and Rh: 7300
Unit Type and Rh: 7300

## 2021-08-24 LAB — RENAL FUNCTION PANEL
Albumin: 2.1 g/dL — ABNORMAL LOW (ref 3.5–5.0)
Anion gap: 10 (ref 5–15)
BUN: 15 mg/dL (ref 8–23)
CO2: 26 mmol/L (ref 22–32)
Calcium: 8.4 mg/dL — ABNORMAL LOW (ref 8.9–10.3)
Chloride: 98 mmol/L (ref 98–111)
Creatinine, Ser: 4.34 mg/dL — ABNORMAL HIGH (ref 0.61–1.24)
GFR, Estimated: 15 mL/min — ABNORMAL LOW (ref >60)
Glucose, Bld: 81 mg/dL (ref 70–99)
Phosphorus: 3.1 mg/dL (ref 2.5–4.6)
Potassium: 3.8 mmol/L (ref 3.5–5.1)
Sodium: 134 mmol/L — ABNORMAL LOW (ref 135–145)

## 2021-08-24 LAB — PREALBUMIN: Prealbumin: 10.6 mg/dL — ABNORMAL LOW (ref 18–38)

## 2021-08-24 SURGERY — ENTEROSCOPY
Anesthesia: Monitor Anesthesia Care

## 2021-08-24 MED ORDER — PROPOFOL 500 MG/50ML IV EMUL
INTRAVENOUS | Status: DC | PRN
Start: 2021-08-24 — End: 2021-08-24
  Administered 2021-08-24: 100 ug/kg/min via INTRAVENOUS

## 2021-08-24 MED ORDER — POLYETHYLENE GLYCOL 3350 17 GM/SCOOP PO POWD
0.5000 | Freq: Once | ORAL | Status: AC
  Administered 2021-08-24: 127.5 g via ORAL
  Filled 2021-08-24: qty 255

## 2021-08-24 MED ORDER — MELATONIN 5 MG PO TABS
10.0000 mg | ORAL_TABLET | Freq: Every evening | ORAL | Status: DC | PRN
  Administered 2021-08-24 – 2021-08-27 (×4): 10 mg via ORAL
  Filled 2021-08-24 (×4): qty 2

## 2021-08-24 MED ORDER — METHOCARBAMOL 500 MG PO TABS
500.0000 mg | ORAL_TABLET | Freq: Once | ORAL | Status: AC
  Administered 2021-08-24: 500 mg via ORAL
  Filled 2021-08-24: qty 1

## 2021-08-24 MED ORDER — SPOT INK MARKER SYRINGE KIT
PACK | SUBMUCOSAL | Status: AC
  Filled 2021-08-24: qty 5

## 2021-08-24 MED ORDER — SODIUM CHLORIDE 0.9 % IV SOLN: INTRAVENOUS | Status: DC | PRN

## 2021-08-24 MED ORDER — SODIUM CHLORIDE 0.9 % IV SOLN
1.0000 g | INTRAVENOUS | Status: DC
Start: 2021-08-24 — End: 2021-08-27
  Administered 2021-08-24 – 2021-08-27 (×4): 1 g via INTRAVENOUS
  Filled 2021-08-24 (×4): qty 10

## 2021-08-24 MED ORDER — PROPOFOL 10 MG/ML IV BOLUS
INTRAVENOUS | Status: DC | PRN
  Administered 2021-08-24: 20 mg via INTRAVENOUS

## 2021-08-24 MED ORDER — METHOCARBAMOL 500 MG PO TABS
500.0000 mg | ORAL_TABLET | Freq: Four times a day (QID) | ORAL | Status: DC | PRN
  Administered 2021-08-24 – 2021-08-27 (×4): 500 mg via ORAL
  Filled 2021-08-24 (×4): qty 1

## 2021-08-24 MED ORDER — GLUCAGON HCL RDNA (DIAGNOSTIC) 1 MG IJ SOLR
INTRAMUSCULAR | Status: DC | PRN
  Administered 2021-08-24: .5 mg via INTRAVENOUS

## 2021-08-24 MED ORDER — HYDROCODONE-ACETAMINOPHEN 5-325 MG PO TABS
1.0000 | ORAL_TABLET | ORAL | Status: DC | PRN
  Administered 2021-08-24 – 2021-08-27 (×7): 2 via ORAL
  Filled 2021-08-24 (×9): qty 2

## 2021-08-24 MED ORDER — PANTOPRAZOLE SODIUM 40 MG PO TBEC
40.0000 mg | DELAYED_RELEASE_TABLET | Freq: Two times a day (BID) | ORAL | Status: DC
  Administered 2021-08-24 – 2021-08-27 (×8): 40 mg via ORAL
  Filled 2021-08-24 (×8): qty 1

## 2021-08-24 MED ORDER — SODIUM CHLORIDE 0.9 % IV SOLN
INTRAVENOUS | Status: AC | PRN
  Administered 2021-08-24: 20 mL via INTRAVENOUS

## 2021-08-24 MED ORDER — GLUCAGON HCL RDNA (DIAGNOSTIC) 1 MG IJ SOLR
INTRAMUSCULAR | Status: AC
  Filled 2021-08-24: qty 1

## 2021-08-24 MED ORDER — SPOT INK MARKER SYRINGE KIT
PACK | SUBMUCOSAL | Status: DC | PRN
  Administered 2021-08-24: 3 mL via SUBMUCOSAL

## 2021-08-24 MED ORDER — MORPHINE SULFATE (PF) 2 MG/ML IV SOLN
2.0000 mg | Freq: Once | INTRAVENOUS | Status: AC
  Administered 2021-08-24: 2 mg via INTRAVENOUS
  Filled 2021-08-24: qty 1

## 2021-08-24 MED ORDER — ONDANSETRON HCL 4 MG/2ML IJ SOLN: 4.0000 mg | Freq: Once | INTRAMUSCULAR | Status: DC | PRN

## 2021-08-24 MED ORDER — MORPHINE SULFATE (PF) 2 MG/ML IV SOLN
2.0000 mg | INTRAVENOUS | Status: AC | PRN
  Administered 2021-08-24 – 2021-08-26 (×4): 2 mg via INTRAVENOUS
  Filled 2021-08-24 (×4): qty 1

## 2021-08-24 NOTE — Interval H&P Note (Signed)
History and Physical Interval Note: ? ?08/24/2021 ?7:37 AM ? ?Daniel [name]  has presented today for surgery, with the diagnosis of GI bleeding, anemia.  The various methods of treatment have been discussed with the patient and family. After consideration of risks, benefits and other options for treatment, the patient has consented to  Procedure(s): ?ENTEROSCOPY (N/A) as a surgical intervention.  The patient's history has been reviewed, patient examined, no change in status, stable for surgery.  I have reviewed the patient's chart and labs.  Questions were answered to the patient's satisfaction.   ? ? ?Pricilla Riffle. Fuller Plan ? ? ?

## 2021-08-24 NOTE — Progress Notes (Signed)
? ?[facility] ?PROGRESS NOTE ? ? ?Daniel [name] QIO:[id number] DOB: 07/31/1960 DOA: 08/23/2021 ? ?PCP: [name], NP ? ?Brief History/Interval Summary: 61 y.o. male with medical history significant for ESRD on HD(MWF), hypertension, CHF, PAD, anemia, GI bleed, left BKA, chronic hepatitis C with concern cirrhosis, GERD with esophagitis, and concern for hepatocellular carcinoma who presents with complaints of dark black stools.  Patient has had multiple hospitalizations for similar issue recently.  Hospitalized from April 21 to April 24 for cellulitis.  Hospitalized from April 10 to April 15 due to GI bleed and underwent upper endoscopy at that time. ?Patient was hospitalized for further management.  Gastroenterology has been consulted.  ? ?Consultants: Gastroenterology ? ?Procedures:   ?Small bowel enteroscopy ?Impression:               - The examined portion of the jejunum was normal.  ?                          Distal extent of the exam tattooed. ?                          - A few duodenal polyps. ?                          - Grade I esophageal varices. ?                          - Portal hypertensive gastropathy. ?                          - No specimens collected. ? ? ? ?Subjective/Interval History: ?Patient currently complains of severe pain in his upper back.  Ongoing for 2 days.  Denies any falls or injuries.  Denies any abdominal pain nausea or vomiting. ? ? ? ?Assessment/Plan: ? ?Symptomatic anemia ?Transfused 1 unit of PRBC during this admission.  Monitor hemoglobin closely.  Anemia starkly secondary to GI bleed. ?Hemoglobin noted to be 8.1 this morning.  Continue to monitor. ? ?GI bleed with melena ?Gastroenterology is following.  Underwent small bowel enteroscopy this morning which did not show any active bleeding.  Portal hypertensive gastropathy was noted.  Plan is for capsule endoscopy tomorrow.  Continue PPI. ? ?Upper back pain ?Developed back pain about 2 days ago.  Worse with  movement.  Denies any falls or injury.  Appears to be musculoskeletal. ?Previous imaging studies reviewed.  He had a thoracic spine MRI in 2022 which revealed a moderate size right paracentral disc extrusion at T6-T7 which was thought to be chronic.  Spinal stenosis was also noted.  Other disc bulging were also noted in T9-T10 through T12-L1.  Degenerative changes are also noted. ?Patient does not have any neurological deficits.  No chest pain.  Give pain medications.  May need to do imaging studies if pain does not improve. ? ?End Stage renal disease on hemodialysis ?Dialyzed Monday Wednesday Friday.  Nephrology is following.  Was dialyzed yesterday. ? ?Recent cellulitis of left lower extremity ?Was discharged on Augmentin and doxycycline.  Unclear if he has finished course of this antibiotics.  Currently on hold.  CRP 1.3.  WBC is normal. ? ?Essential hypertension ?Continue amlodipine. ? ?Chronic hepatitis C/mild transaminitis ?Hepatitis C virus quantitative noted to be 9.88 million on 4/12.  Outpatient follow-up with  GI. ? ?Hepatocellular carcinoma ?Recently diagnosed via MRI.  Oncology is supposed to follow in the outpatient setting, [name]. ? ?Lung mass ?Followed by Dr. Julien Nordmann and Dr. Valeta Harms.  Recent PET scan.  Outpatient management. ? ?Peripheral artery disease ?S/p SFA stenting on 4/13.  He had been on aspirin and Plavix, but Plavix was stopped during hospitalization earlier this month. Vascular surgery had recommended to continue only on aspirin, but patient does not recall this being given to him at the pharmacy when he last went. ?Should continue only aspirin at discharge. ? ?Tobacco abuse ?Patient reports smoking half pack cigarettes per day on average.   ?-Nicotine patch offered ?  ?Obesity ?Estimated body mass index is 35.24 kg/m? as calculated from the following: ?  Height as of this encounter: '5\' 11"'$  (1.803 m). ?  Weight as of this encounter: 114.6 kg. ? ? ?DVT Prophylaxis: SCDs ?Code Status: Full  code ?Family Communication: Discussed with patient ?Disposition Plan: Hopefully return home when improved ? ?Status is: Inpatient ?Remains inpatient appropriate because: Concern for GI bleed ? ? ? ?Medications: Scheduled: ? amLODipine  2.5 mg Oral Daily  ? atorvastatin  40 mg Oral Daily  ? Chlorhexidine Gluconate Cloth  6 each Topical Q0600  ? gabapentin  100 mg Oral TID  ? nicotine  21 mg Transdermal Daily  ? pantoprazole  40 mg Oral BID AC  ? sevelamer carbonate  800 mg Oral TID WC  ? sodium chloride flush  3 mL Intravenous Q12H  ? ?Continuous: ? sodium thiosulfate infusion for calciphylaxis 25 g (08/23/21 1538)  ? ?[id number] **OR** acetaminophen, albuterol, hydrALAZINE, HYDROcodone-acetaminophen, melatonin ? ?Antibiotics: ?Anti-infectives (From admission, onward)  ? ? None  ? ?  ? ? ?Objective: ? ?Vital Signs ? ?Vitals:  ? 08/24/21 1478 08/24/21 2956 08/24/21 2130 08/24/21 0902  ?BP: (!) 156/65 (!) 174/71 (!) 180/66 (!) 171/46  ?Pulse: 92 85 86 88  ?Resp: '20 17 18 20  '$ ?Temp: 97.9 ?F (36.6 ?C)  97.9 ?F (36.6 ?C) 98 ?F (36.7 ?C)  ?TempSrc:    Oral  ?SpO2: 100% 100% 100% 98%  ?Weight:      ?Height:      ? ? ?Intake/Output Summary (Last 24 hours) at 08/24/2021 1053 ?Last data filed at 08/24/2021 0810 ?Gross per 24 hour  ?Intake 557.17 ml  ?Output 3050 ml  ?Net -2492.83 ml  ? ?Filed Weights  ? 08/23/21 0537 08/23/21 1738 08/24/21 0508  ?Weight: 115.7 kg 115.5 kg 114.6 kg  ? ? ?General appearance: Awake alert.  In no distress ?Resp: Clear to auscultation bilaterally.  Normal effort ?Cardio: S1-S2 is normal regular.  No S3-S4.  No rubs murmurs or bruit ?GI: Abdomen is soft.  Nontender nondistended.  Bowel sounds are present normal.  No masses organomegaly ?Extremities: No edema.  Able to move all of his extremities. ?Was not able to turn over for evaluation of back. ?Neurologic: Alert and oriented x3.  No focal neurological deficits.  ? ? ?Lab Results: ? ?Data Reviewed: I have personally reviewed following labs  and reports of the imaging studies ? ?CBC: ?Recent Labs  ?Lab 08/18/21 ?8657 08/19/21 ?8469 08/19/21 ?1330 08/23/21 ?6295 08/23/21 ?1748 08/24/21 ?0424  ?WBC 5.4 5.6  --  5.3  --  5.6  ?NEUTROABS 2.8  --   --   --   --   --   ?HGB 7.8* 7.0* 9.1* 7.4* 8.5* 8.1*  ?HCT 24.3* 21.8* 27.2* 23.4* 25.4* 24.7*  ?MCV 100.4* 100.0  --  101.7*  --  98.0  ?PLT 151 142*  --  159  --  145*  ? ? ?Basic Metabolic Panel: ?Recent Labs  ?Lab 08/18/21 ?7681 08/18/21 ?0405 08/19/21 ?0119 08/23/21 ?1572 08/24/21 ?0301  ?NA 137 136 135 140 134*  ?K 5.0 4.7 5.0 4.3 3.8  ?CL 101 99 101 104 98  ?CO2 21* 22 20* 24 26  ?GLUCOSE 115* 106* 113* 109* 81  ?BUN 60* 61* 64* 35* 15  ?CREATININE 10.55* 10.59* 10.98* 7.97* 4.34*  ?CALCIUM 8.4* 8.4* 8.4* 9.1 8.4*  ?MG 2.3 2.2 2.3  --   --   ?PHOS 4.5  --  5.2*  --  3.1  ? ? ?GFR: ?Estimated Creatinine Clearance: 23 mL/min (A) (by C-G formula based on SCr of 4.34 mg/dL (H)). ? ?Liver Function Tests: ?Recent Labs  ?Lab 08/18/21 ?6203 08/23/21 ?5597 08/24/21 ?0301  ?AST  --  62*  --   ?ALT  --  21  --   ?ALKPHOS  --  93  --   ?BILITOT  --  0.6  --   ?PROT  --  7.5  --   ?ALBUMIN 2.1* 2.1* 2.1*  ? ? ? ?CBG: ?Recent Labs  ?Lab 08/19/21 ?4163 08/19/21 ?1239 08/24/21 ?0506 08/24/21 ?0732 08/24/21 ?8453  ?GLUCAP 111* 115* 78 82 106*  ? ? ? ?Recent Results (from the past 240 hour(s))  ?Blood Culture (routine x 2)     Status: None  ? Collection Time: 08/16/21  8:33 PM  ? Specimen: BLOOD  ?Result Value Ref Range Status  ? Specimen Description BLOOD RIGHT ANTECUBITAL  Final  ? Special Requests   Final  ?  BOTTLES DRAWN AEROBIC AND ANAEROBIC Blood Culture results may not be optimal due to an excessive volume of blood received in culture bottles  ? Culture   Final  ?  NO GROWTH 5 DAYS ?Performed at [facility], [place] [address] [place], [place] [address] ?  ? Report Status 08/21/2021 FINAL  Final  ?Blood Culture (routine x 2)     Status: None  ? Collection Time: 08/16/21  8:49 PM  ? Specimen: BLOOD  ?Result  Value Ref Range Status  ? Specimen Description BLOOD BLOOD RIGHT HAND  Final  ? Special Requests   Final  ?  BOTTLES DRAWN AEROBIC AND ANAEROBIC Blood Culture adequate volume  ? Culture   Final  ?  NO GROWTH 5

## 2021-08-24 NOTE — Anesthesia Procedure Notes (Signed)
Procedure Name: [place] ?Date/Time: 08/24/2021 7:45 AM ?Performed by: Eligha Bridegroom, CRNA ?Pre-anesthesia Checklist: Patient identified, Emergency Drugs available, Suction available, Patient being monitored and Timeout performed ?Patient Re-evaluated:Patient Re-evaluated prior to induction ?Oxygen Delivery Method: Nasal cannula ?Preoxygenation: Pre-oxygenation with 100% oxygen ?Induction Type: IV induction ? ? ? ? ?

## 2021-08-24 NOTE — Progress Notes (Addendum)
?[facility] ?Progress Note  ? ?Subjective: Seen leaving PACU post EGD. Dr. Fuller Plan at bedside. Nonbleeding gastropathy seen today. Planning capsule study tomorrow. Patient without C/Os. HGB 8.1 this AM.  ? ?Objective ?Vitals:  ? 08/24/21 8469 08/24/21 0719 08/24/21 6295 08/24/21 2841  ?BP: (!) 168/65 (!) 181/67 (!) 156/65 (!) 174/71  ?Pulse: 85 90 92 85  ?Resp: '15 16 20 17  '$ ?Temp: 98.2 ?F (36.8 ?C) 98.4 ?F (36.9 ?C) 97.9 ?F (36.6 ?C)   ?TempSrc: Oral Temporal    ?SpO2: 96% 98% 100% 100%  ?Weight: 114.6 kg     ?Height:      ? ?Physical Exam ?General: Chronically ill appearing male in NAD ?Heart:s1,S2 RRR NO M/R/G ?Lungs: CTAB Anteriorly ?Abdomen: obese, NABS, NT ?Extremities: L BKA with trace stump edema, RLE with trace pre tib edema.  ?Dialysis Access: L AVF + T/B ? ? ?Additional Objective ?Labs: ?Basic Metabolic Panel: ?Recent Labs  ?Lab 08/18/21 ?3244 08/18/21 ?0405 08/19/21 ?0119 08/23/21 ?0102 08/24/21 ?0301  ?NA 137   < > 135 140 134*  ?K 5.0   < > 5.0 4.3 3.8  ?CL 101   < > 101 104 98  ?CO2 21*   < > 20* 24 26  ?GLUCOSE 115*   < > 113* 109* 81  ?BUN 60*   < > 64* 35* 15  ?CREATININE 10.55*   < > 10.98* 7.97* 4.34*  ?CALCIUM 8.4*   < > 8.4* 9.1 8.4*  ?PHOS 4.5  --  5.2*  --  3.1  ? < > = values in this interval not displayed.  ? ?Liver Function Tests: ?Recent Labs  ?Lab 08/18/21 ?7253 08/23/21 ?6644 08/24/21 ?0301  ?AST  --  62*  --   ?ALT  --  21  --   ?ALKPHOS  --  93  --   ?BILITOT  --  0.6  --   ?PROT  --  7.5  --   ?ALBUMIN 2.1* 2.1* 2.1*  ? ?No results for input(s): LIPASE, AMYLASE in the last 168 hours. ?CBC: ?Recent Labs  ?Lab 08/18/21 ?0347 08/19/21 ?4259 08/19/21 ?1330 08/23/21 ?5638 08/23/21 ?1748 08/24/21 ?0424  ?WBC 5.4 5.6  --  5.3  --  5.6  ?NEUTROABS 2.8  --   --   --   --   --   ?HGB 7.8* 7.0*   < > 7.4* 8.5* 8.1*  ?HCT 24.3* 21.8*   < > 23.4* 25.4* 24.7*  ?MCV 100.4* 100.0  --  101.7*  --  98.0  ?PLT 151 142*  --  159  --  145*  ? < > = values in this interval not displayed.   ? ?Blood Culture ?   ?Component Value Date/Time  ? SDES BLOOD BLOOD RIGHT HAND 08/16/2021 2049  ? SPECREQUEST  08/16/2021 2049  ?  BOTTLES DRAWN AEROBIC AND ANAEROBIC Blood Culture adequate volume  ? CULT  08/16/2021 2049  ?  NO GROWTH 5 DAYS ?Performed at [facility], [place] [address], [place], [place] [address] ?  ? REPTSTATUS 08/21/2021 FINAL 08/16/2021 2049  ? ? ?Cardiac Enzymes: ?No results for input(s): CKTOTAL, CKMB, CKMBINDEX, TROPONINI in the last 168 hours. ?CBG: ?Recent Labs  ?Lab 08/19/21 ?3295 08/19/21 ?1239 08/24/21 ?0506 08/24/21 ?0732 08/24/21 ?1884  ?GLUCAP 111* 115* 78 82 106*  ? ?Iron Studies: No results for input(s): IRON, TIBC, TRANSFERRIN, FERRITIN in the last 72 hours. ?'@lablastinr3'$ @ ?Studies/Results: ?No results found. ?Medications: ? sodium thiosulfate infusion for calciphylaxis 25 g (08/23/21  1538)  ? ? amLODipine  2.5 mg Oral Daily  ? atorvastatin  40 mg Oral Daily  ? Chlorhexidine Gluconate Cloth  6 each Topical Q0600  ? gabapentin  100 mg Oral TID  ? nicotine  21 mg Transdermal Daily  ? pantoprazole  40 mg Oral BID AC  ? sevelamer carbonate  800 mg Oral TID WC  ? sodium chloride flush  3 mL Intravenous Q12H  ? ? ? ?OP HD: SW MWF ? 4h 450/500  115.5kg  2/2 bath  LUA AVF   Hep none ? - 4/12 > HBsAg neg w/ Abs > 10/ protective ? - last HD 4.21 post 117kg ? - Hb 7.2 on 4/21, tsat 11%, ferr 67, pth 165 ? - last mircera 225 ug on 4/05 ? - Na thio 25 gm each HD ?  ?  ?#Recurrent GI bleed/acute on chronic anemia: Received a unit of blood transfusion in ER.  GI is following. EGD this AM, report pending. Non-bleeding gastropathy and congestion seen today. Plan for capsule study tomorrow.  He received Aranesp this week, we will continue weekly Aranesp.  Monitor hemoglobin and transfuse as needed. ?  ?# ESRD MWF: Next HD 08/26/2021. ?  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  Avoid oral calcium or VDRF because of history of calciphylaxis.  DC PhosLo and start Renvela. ?  ?# HTN/volume:  HD 04/28 Net UF2.8 Liters. Slightly under OP EDW if wts are accurate but needs further lowering of volume. Denies SOB. UF as tolerated. ? ?# Calciphylaxis, ? 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  ? ?Rita H. Brown NP-C ?08/24/2021, 8:53 AM  ?[facility] ?[phone] ? ?Nephrology attending: ?I have personally seen and examined the patient.  Chart reviewed and I agree with above. ?The patient underwent a small bowel enteroscopy today.  He just came back from the endoscopy procedure and reports very tired.  He probably got some sedatives during endoscopy procedure.  He had dialysis yesterday and tolerated well.  We will plan for next HD on Monday.  Further plan per GI. ? ?D.  Carolin Sicks, ?[facility]. ? ?  ? ?

## 2021-08-24 NOTE — Plan of Care (Signed)

## 2021-08-24 NOTE — Anesthesia Postprocedure Evaluation (Signed)
Anesthesia Post Note ? ?Patient: Daniel [name] ? ?Procedure(s) Performed: ENTEROSCOPY ?SUBMUCOSAL TATTOO INJECTION ? ?  ? ?Patient location during evaluation: PACU ?Anesthesia Type: MAC ?Level of consciousness: awake and alert ?Pain management: pain level controlled ?Vital Signs Assessment: post-procedure vital signs reviewed and stable ?Respiratory status: spontaneous breathing, nonlabored ventilation, respiratory function stable and patient connected to nasal cannula oxygen ?Cardiovascular status: stable and blood pressure returned to baseline ?Postop Assessment: no apparent nausea or vomiting ?Anesthetic complications: no ? ? ?No notable events documented. ? ?Last Vitals:  ?Vitals:  ? 08/24/21 0839 08/24/21 0902  ?BP: (!) 180/66 (!) 171/46  ?Pulse: 86 88  ?Resp: 18 20  ?Temp: 36.6 ?C 36.7 ?C  ?SpO2: 100% 98%  ?  ?Last Pain:  ?Vitals:  ? 08/24/21 0905  ?TempSrc:   ?PainSc: 10-Worst pain ever  ? ? ?  ?  ?  ?  ?  ?  ? ?Effie Berkshire ? ? ? ? ?

## 2021-08-24 NOTE — Transfer of Care (Signed)
2Immediate Anesthesia Transfer of Care Note ? ?Patient: Daniel [name] ? ?Procedure(s) Performed: ENTEROSCOPY ?SUBMUCOSAL TATTOO INJECTION ? ?Patient Location: PACU ? ?Anesthesia Type:MAC ? ?Level of Consciousness: awake and alert  ? ?Airway & Oxygen Therapy: Patient Spontanous Breathing and Patient connected to nasal cannula oxygen ? ?Post-op Assessment: Report given to RN and Post -op Vital signs reviewed and stable ? ?Post vital signs: Reviewed and stable ? ?Last Vitals:  ?Vitals Value Taken Time  ?BP 156/65 08/24/21 0817  ?Temp    ?Pulse 90 08/24/21 0821  ?Resp 17 08/24/21 0821  ?SpO2 100 % 08/24/21 0821  ?Vitals shown include unvalidated device data. ? ?Last Pain:  ?Vitals:  ? 08/24/21 0719  ?TempSrc: Temporal  ?PainSc: 0-No pain  ?   ? ?  ? ?Complications: No notable events documented. ?

## 2021-08-24 NOTE — Op Note (Signed)
[facility] ?Patient Name: Daniel [name] ?Procedure Date : 08/24/2021 ?MRN: [id number] ?Attending MD: Ladene Artist , MD ?Date of Birth: 04/20/1961 ?CSN: [id number] ?Age: 61 ?Admit Type: Inpatient ?Procedure:                Small bowel enteroscopy ?Indications:              Acute post hemorrhagic anemia, Melena ?Providers:                Pricilla Riffle. Fuller Plan, MD, Grace Isaac, RN, Alphonzo Grieve  ?                          Leighton Roach, Technician ?Referring MD:             [facility] ?Medicines:                Monitored Anesthesia Care ?Complications:            No immediate complications. ?Estimated Blood Loss:     Estimated blood loss was minimal. ?Procedure:                Pre-Anesthesia Assessment: ?                          - Prior to the procedure, a History and Physical  ?                          was performed, and patient medications and  ?                          allergies were reviewed. The patient's tolerance of  ?                          previous anesthesia was also reviewed. The risks  ?                          and benefits of the procedure and the sedation  ?                          options and risks were discussed with the patient.  ?                          All questions were answered, and informed consent  ?                          was obtained. Prior Anticoagulants: The patient has  ?                          taken Plavix (clopidogrel), last dose was 18 days  ?                          prior to procedure. ASA Grade Assessment: III - A  ?                          patient with severe systemic disease. After  ?  reviewing the risks and benefits, the patient was  ?                          deemed in satisfactory condition to undergo the  ?                          procedure. ?                          After obtaining informed consent, the endoscope was  ?                          passed under direct vision. Throughout the  ?                          procedure, the patient's  blood pressure, pulse, and  ?                          oxygen saturations were monitored continuously. The  ?                          [id number]-HQ190TL ([id number]) Olympus peds colonoscope was  ?                          introduced through the mouth and advanced to the  ?                          proximal jejunum. The small bowel enteroscopy was  ?                          accomplished without difficulty. The patient  ?                          tolerated the procedure well. ?Scope In: ?Scope Out: ?Findings: ?     There was no evidence of significant pathology in the proximal jejunum.  ?     Area was tattooed at the distal extent of the exam with an injection of  ?     2 mL of Spot (carbon black). ?     A few 4 to 10 mm sessile polyps with no bleeding were found in the  ?     second portion of the duodenum and in the third portion of the duodenum.  ?     Previously biopsied. ?     The exam of the duodenum was otherwise normal. ?     Grade I varices were found in the distal esophagus. They were 3 mm in  ?     largest diameter. ?     The exam of the esophagus was otherwise normal. ?     Moderate portal hypertensive gastropathy was found in the entire  ?     examined stomach. Minimal contact bleeding noted in the cardia. ?     The exam of the stomach was otherwise normal. ?Impression:               - The examined portion of the jejunum was normal.  ?  Distal extent of the exam tattooed. ?                          - A few duodenal polyps. ?                          - Grade I esophageal varices. ?                          - Portal hypertensive gastropathy. ?                          - No specimens collected. ?Recommendation:           - Return patient to hospital ward for ongoing care. ?                          - Full liquid diet today. ?                          - Continue to hold Plavix for now. ?                          - Change pantoprazole to 40 mg po bid. ?                          - OK to  continue ASA 81 mg qd as indicated. ?                          - Possible bleeding from portal gastropathy. ?                          - Schedule VCE. ?Procedure Code(s):        --- Professional --- ?                          [phone], Small intestinal endoscopy, enteroscopy  ?                          beyond second portion of duodenum, not including  ?                          ileum; diagnostic, including collection of  ?                          specimen(s) by brushing or washing, when performed  ?                          (separate procedure) ?                          [phone], Unlisted procedure, small intestine ?Diagnosis Code(s):        --- Professional --- ?                          K31.7, Polyp of stomach and duodenum ?  I85.00, Esophageal varices without bleeding ?                          K76.6, Portal hypertension ?                          K31.89, Other diseases of stomach and duodenum ?                          D62, Acute posthemorrhagic anemia ?                          K92.1, Melena (includes Hematochezia) ?CPT copyright 2019 American Medical Association. All rights reserved. ?The codes documented in this report are preliminary and upon coder review may  ?be revised to meet current compliance requirements. ?Ladene Artist, MD ?08/24/2021 8:34:20 AM ?This report has been signed electronically. ?Number of Addenda: 0 ?

## 2021-08-25 ENCOUNTER — Encounter (HOSPITAL_COMMUNITY)
Admission: EM | Disposition: A | Discharge status: Home / Self Care | Payer: Self-pay | Source: Home / Self Care | Attending: Internal Medicine

## 2021-08-25 DIAGNOSIS — D649 Anemia, unspecified: Secondary | ICD-10-CM | POA: Diagnosis not present

## 2021-08-25 DIAGNOSIS — E1169 Type 2 diabetes mellitus with other specified complication: Secondary | ICD-10-CM | POA: Diagnosis not present

## 2021-08-25 DIAGNOSIS — M549 Dorsalgia, unspecified: Secondary | ICD-10-CM

## 2021-08-25 DIAGNOSIS — K922 Gastrointestinal hemorrhage, unspecified: Secondary | ICD-10-CM | POA: Diagnosis not present

## 2021-08-25 DIAGNOSIS — I10 Essential (primary) hypertension: Secondary | ICD-10-CM | POA: Diagnosis not present

## 2021-08-25 HISTORY — PX: GIVENS CAPSULE STUDY: SHX5432

## 2021-08-25 LAB — RENAL FUNCTION PANEL
Albumin: 2.2 g/dL — ABNORMAL LOW (ref 3.5–5.0)
Anion gap: 13 (ref 5–15)
BUN: 24 mg/dL — ABNORMAL HIGH (ref 8–23)
CO2: 24 mmol/L (ref 22–32)
Calcium: 8.7 mg/dL — ABNORMAL LOW (ref 8.9–10.3)
Chloride: 93 mmol/L — ABNORMAL LOW (ref 98–111)
Creatinine, Ser: 5.45 mg/dL — ABNORMAL HIGH (ref 0.61–1.24)
GFR, Estimated: 11 mL/min — ABNORMAL LOW (ref >60)
Glucose, Bld: 81 mg/dL (ref 70–99)
Phosphorus: 4.8 mg/dL — ABNORMAL HIGH (ref 2.5–4.6)
Potassium: 4 mmol/L (ref 3.5–5.1)
Sodium: 130 mmol/L — ABNORMAL LOW (ref 135–145)

## 2021-08-25 SURGERY — IMAGING PROCEDURE, GI TRACT, INTRALUMINAL, VIA CAPSULE
Anesthesia: LOCAL

## 2021-08-25 MED ORDER — LORAZEPAM 2 MG/ML IJ SOLN: 0.5000 mg | Freq: Once | INTRAMUSCULAR | Status: DC

## 2021-08-25 SURGICAL SUPPLY — 1 items: TOWEL COTTON PACK 4EA (MISCELLANEOUS) ×4 IMPLANT

## 2021-08-25 NOTE — Plan of Care (Signed)
?  Problem: Education: ?Goal: Knowledge of General Education information will improve ?Description: Including pain rating scale, medication(s)/side effects and non-pharmacologic comfort measures ?Outcome: Progressing ?  ?Problem: Clinical Measurements: ?Goal: Ability to maintain clinical measurements within normal limits will improve ?Outcome: Progressing ?  ?Problem: Elimination: ?Goal: Will not experience complications related to bowel motility ?Outcome: Progressing ?  ?Problem: Safety: ?Goal: Ability to remain free from injury will improve ?Outcome: Progressing ?  ?

## 2021-08-25 NOTE — Progress Notes (Signed)
? ?[facility] ?PROGRESS NOTE ? ? ?Daniel [name] MVE:[id number] DOB: 1960/12/18 DOA: 08/23/2021 ? ?PCP: [name], NP ? ?Brief History/Interval Summary: 61 y.o. male with medical history significant for ESRD on HD(MWF), hypertension, CHF, PAD, anemia, GI bleed, left BKA, chronic hepatitis C with concern cirrhosis, GERD with esophagitis, and concern for hepatocellular carcinoma who presents with complaints of dark black stools.  Patient has had multiple hospitalizations for similar issue recently.  Hospitalized from April 21 to April 24 for cellulitis.  Hospitalized from April 10 to April 15 due to GI bleed and underwent upper endoscopy at that time. ?Patient was hospitalized for further management.  Gastroenterology has been consulted.  ? ?Consultants: Gastroenterology ? ?Procedures:   ?Small bowel Enteroscopy ?Impression:               - The examined portion of the jejunum was normal.  ?                          Distal extent of the exam tattooed. ?                          - A few duodenal polyps. ?                          - Grade I esophageal varices. ?                          - Portal hypertensive gastropathy. ?                          - No specimens collected. ? ?Capsule study ? ? ? ?Subjective/Interval History: ?Patient mentions that his back pain is better but still present.  Able to move his lower extremities.  Swallowed the capsule for his capsule study this morning.   ? ? ? ?Assessment/Plan: ? ?Symptomatic anemia ?Transfused 1 unit of PRBC during this admission.  Monitor hemoglobin closely.  Anemia likely secondary to GI bleed. ?Hemoglobin was 8.1 yesterday.  Not done yet today.  We will order labs for tomorrow.   ? ?GI bleed with melena ?Gastroenterology is following.  Patient presented with melanotic stool.  He was still taking Plavix even though he was supposed to be off of it.   ?He underwent small bowel enteroscopy on 4/29 which did not show any active bleeding.  Portal  hypertensive gastropathy was noted.   ?Plan is for capsule endoscopy.  Continue PPI.   ? ?Upper back pain ?Developed back pain about 2 days prior to admission.  Worse with movement.  Denies any falls or injury.  Appears to be musculoskeletal. ?Previous imaging studies reviewed.  He had a thoracic spine MRI in 2022 which revealed a moderate size right paracentral disc extrusion at T6-T7 which was thought to be chronic.  Spinal stenosis was also noted.  Other disc bulging were also noted in T9-T10 through T12-L1.  Degenerative changes are also noted. ?Patient does not have any neurological deficits.  No chest pain.   ?Patient was given pain medications with some improvement.  Continues to have discomfort in the upper back.  Thoracic films did show degenerative changes but views were limited.  If pain does not improve may have to do MRI of his thoracic spine.  Will reevaluate tomorrow.   ? ?End Stage renal  disease on hemodialysis ?Dialyzed Monday Wednesday Friday.  Nephrology is following.   ? ?Recent cellulitis of left lower extremity/status post left BKA previously ?Was discharged on Augmentin and doxycycline.  Unclear if he has finished course of this antibiotics.  Currently on hold.  CRP 1.3.  WBC is normal.  No evidence for infection in his left lower extremity stump ? ?Essential hypertension ?Continue amlodipine. ? ?Chronic hepatitis C/mild transaminitis ?Hepatitis C virus quantitative noted to be 9.88 million on 4/12.  Outpatient follow-up with GI. ? ?Hepatocellular carcinoma ?Recently diagnosed via MRI.  Oncology is supposed to follow in the outpatient setting, [name]. ? ?Lung mass ?Followed by Dr. Julien Nordmann and Dr. Valeta Harms.  Recent PET scan.  Outpatient management. ? ?Peripheral artery disease ?S/p SFA stenting on 4/13.  He had been on aspirin and Plavix, but Plavix was stopped during hospitalization earlier this month. Vascular surgery had recommended to continue only on aspirin.  Patient apparently did not  recall this. ?Should continue only aspirin at discharge. ? ?Tobacco abuse ?Patient reports smoking half pack cigarettes per day on average.  He was counseled ?  ?Obesity ?Estimated body mass index is 35.85 kg/m? as calculated from the following: ?  Height as of this encounter: '5\' 11"'$  (1.803 m). ?  Weight as of this encounter: 116.6 kg. ? ? ?DVT Prophylaxis: SCDs ?Code Status: Full code ?Family Communication: Discussed with patient ?Disposition Plan: Hopefully return home when improved ? ?Status is: Inpatient ?Remains inpatient appropriate because: Concern for GI bleed ? ? ? ?Medications: Scheduled: ? amLODipine  2.5 mg Oral Daily  ? atorvastatin  40 mg Oral Daily  ? Chlorhexidine Gluconate Cloth  6 each Topical Q0600  ? gabapentin  100 mg Oral TID  ? nicotine  21 mg Transdermal Daily  ? pantoprazole  40 mg Oral BID AC  ? sevelamer carbonate  800 mg Oral TID WC  ? sodium chloride flush  3 mL Intravenous Q12H  ? ?Continuous: ? cefTRIAXone (ROCEPHIN)  IV Stopped (08/24/21 1244)  ? sodium thiosulfate infusion for calciphylaxis 25 g (08/23/21 1538)  ? ?DVV:[id number] **OR** acetaminophen, albuterol, hydrALAZINE, HYDROcodone-acetaminophen, melatonin, methocarbamol, morphine injection ? ?Antibiotics: ?Anti-infectives (From admission, onward)  ? ? Start     Dose/Rate Route Frequency Ordered Stop  ? 08/24/21 1215  cefTRIAXone (ROCEPHIN) 1 g in sodium chloride 0.9 % 100 mL IVPB       ? 1 g ?200 mL/hr over 30 Minutes Intravenous Every 24 hours 08/24/21 1123    ? ?  ? ? ?Objective: ? ?Vital Signs ? ?Vitals:  ? 08/24/21 4854 08/24/21 0902 08/24/21 2108 08/25/21 0516  ?BP: (!) 180/66 (!) 171/46 137/78 (!) 138/55  ?Pulse: 86 88 88 84  ?Resp: '18 20 18 18  '$ ?Temp: 97.9 ?F (36.6 ?C) 98 ?F (36.7 ?C) (!) 97.5 ?F (36.4 ?C) 97.6 ?F (36.4 ?C)  ?TempSrc:  Oral Oral Oral  ?SpO2: 100% 98% 100% 100%  ?Weight:   116.6 kg   ?Height:      ? ? ?Intake/Output Summary (Last 24 hours) at 08/25/2021 0839 ?Last data filed at 08/25/2021 0514 ?Gross per  24 hour  ?Intake 481.26 ml  ?Output 1150 ml  ?Net -668.74 ml  ? ? ?Filed Weights  ? 08/23/21 1738 08/24/21 0508 08/24/21 2108  ?Weight: 115.5 kg 114.6 kg 116.6 kg  ? ? ?General appearance: Awake alert.  In no distress ?Resp: Clear to auscultation bilaterally.  Normal effort ?Cardio: S1-S2 is normal regular.  No S3-S4.  No rubs murmurs or bruit ?  GI: Abdomen is soft.  Nontender nondistended.  Bowel sounds are present normal.  No masses organomegaly ?Examination of the back reveals tenderness over the upper back in the midline around T3-T5. ?Extremities: No erythema noted over his left lower extremity ?Neurologic: Alert and oriented x3.  No focal neurological deficits.  ? ? ? ?Lab Results: ? ?Data Reviewed: I have personally reviewed following labs and reports of the imaging studies ? ?CBC: ?Recent Labs  ?Lab 08/19/21 ?0721 08/19/21 ?1330 08/23/21 ?7846 08/23/21 ?1748 08/24/21 ?0424  ?WBC 5.6  --  5.3  --  5.6  ?HGB 7.0* 9.1* 7.4* 8.5* 8.1*  ?HCT 21.8* 27.2* 23.4* 25.4* 24.7*  ?MCV 100.0  --  101.7*  --  98.0  ?PLT 142*  --  159  --  145*  ? ? ? ?Basic Metabolic Panel: ?Recent Labs  ?Lab 08/19/21 ?0119 08/23/21 ?9629 08/24/21 ?0301 08/25/21 ?0121  ?NA 135 140 134* 130*  ?K 5.0 4.3 3.8 4.0  ?CL 101 104 98 93*  ?CO2 20* '24 26 24  '$ ?GLUCOSE 113* 109* 81 81  ?BUN 64* 35* 15 24*  ?CREATININE 10.98* 7.97* 4.34* 5.45*  ?CALCIUM 8.4* 9.1 8.4* 8.7*  ?MG 2.3  --   --   --   ?PHOS 5.2*  --  3.1 4.8*  ? ? ? ?GFR: ?Estimated Creatinine Clearance: 18.5 mL/min (A) (by C-G formula based on SCr of 5.45 mg/dL (H)). ? ?Liver Function Tests: ?Recent Labs  ?Lab 08/23/21 ?5284 08/24/21 ?0301 08/25/21 ?0121  ?AST 62*  --   --   ?ALT 21  --   --   ?ALKPHOS 93  --   --   ?BILITOT 0.6  --   --   ?PROT 7.5  --   --   ?ALBUMIN 2.1* 2.1* 2.2*  ? ? ? ? ?CBG: ?Recent Labs  ?Lab 08/19/21 ?1324 08/19/21 ?1239 08/24/21 ?0506 08/24/21 ?0732 08/24/21 ?4010  ?GLUCAP 111* 115* 78 82 106*  ? ? ? ? ?Recent Results (from the past 240 hour(s))  ?Blood Culture  (routine x 2)     Status: None  ? Collection Time: 08/16/21  8:33 PM  ? Specimen: BLOOD  ?Result Value Ref Range Status  ? Specimen Description BLOOD RIGHT ANTECUBITAL  Final  ? Special Requests   Final  ?  BOTT

## 2021-08-25 NOTE — Progress Notes (Addendum)
?[facility] ?Progress Note  ? ?Subjective: Seen in room. No events overnight. No C/Os today. HD tomorrow on schedule.   ? ?Objective ?Vitals:  ? 08/24/21 0902 08/24/21 2108 08/25/21 0516 08/25/21 1041  ?BP: (!) 171/46 137/78 (!) 138/55 (!) 154/64  ?Pulse: 88 88 84 85  ?Resp: '20 18 18 19  '$ ?Temp: 98 ?F (36.7 ?C) (!) 97.5 ?F (36.4 ?C) 97.6 ?F (36.4 ?C) 97.6 ?F (36.4 ?C)  ?TempSrc: Oral Oral Oral Axillary  ?SpO2: 98% 100% 100% 97%  ?Weight:  116.6 kg    ?Height:      ? ?Physical Exam ?General: Chronically ill appearing male in NAD ?Heart:s1,S2 RRR NO M/R/G ?Lungs: CTAB Anteriorly ?Abdomen: obese, NABS, NT ?Extremities: L BKA with trace stump edema, RLE with trace pre tib edema.  ?Dialysis Access: L AVF + T/B ? ? ?Additional Objective ?Labs: ?Basic Metabolic Panel: ?Recent Labs  ?Lab 08/19/21 ?0119 08/23/21 ?9379 08/24/21 ?0301 08/25/21 ?0121  ?NA 135 140 134* 130*  ?K 5.0 4.3 3.8 4.0  ?CL 101 104 98 93*  ?CO2 20* '24 26 24  '$ ?GLUCOSE 113* 109* 81 81  ?BUN 64* 35* 15 24*  ?CREATININE 10.98* 7.97* 4.34* 5.45*  ?CALCIUM 8.4* 9.1 8.4* 8.7*  ?PHOS 5.2*  --  3.1 4.8*  ? ?Liver Function Tests: ?Recent Labs  ?Lab 08/23/21 ?0240 08/24/21 ?0301 08/25/21 ?0121  ?AST 62*  --   --   ?ALT 21  --   --   ?ALKPHOS 93  --   --   ?BILITOT 0.6  --   --   ?PROT 7.5  --   --   ?ALBUMIN 2.1* 2.1* 2.2*  ? ?No results for input(s): LIPASE, AMYLASE in the last 168 hours. ?CBC: ?Recent Labs  ?Lab 08/19/21 ?0721 08/19/21 ?1330 08/23/21 ?9735 08/23/21 ?1748 08/24/21 ?0424  ?WBC 5.6  --  5.3  --  5.6  ?HGB 7.0*   < > 7.4* 8.5* 8.1*  ?HCT 21.8*   < > 23.4* 25.4* 24.7*  ?MCV 100.0  --  101.7*  --  98.0  ?PLT 142*  --  159  --  145*  ? < > = values in this interval not displayed.  ? ?Blood Culture ?   ?Component Value Date/Time  ? SDES BLOOD BLOOD RIGHT HAND 08/16/2021 2049  ? SPECREQUEST  08/16/2021 2049  ?  BOTTLES DRAWN AEROBIC AND ANAEROBIC Blood Culture adequate volume  ? CULT  08/16/2021 2049  ?  NO GROWTH 5 DAYS ?Performed at  [facility], [place] [address] [place], [place] [address] ?  ? REPTSTATUS 08/21/2021 FINAL 08/16/2021 2049  ? ? ?Cardiac Enzymes: ?No results for input(s): CKTOTAL, CKMB, CKMBINDEX, TROPONINI in the last 168 hours. ?CBG: ?Recent Labs  ?Lab 08/19/21 ?4268 08/19/21 ?1239 08/24/21 ?0506 08/24/21 ?0732 08/24/21 ?3419  ?GLUCAP 111* 115* 78 82 106*  ? ?Iron Studies: No results for input(s): IRON, TIBC, TRANSFERRIN, FERRITIN in the last 72 hours. ?'@lablastinr3'$ @ ?Studies/Results: ?DG Thoracic Spine 2 View ? ?Result Date: 08/24/2021 ?CLINICAL DATA:  Pain in the thoracic spine without reported history of trauma. EXAM: THORACIC SPINE 2 VIEWS COMPARISON:  Prior PET exam from July 23, 2021. FINDINGS: Spinal alignment is maintained. Signs of degenerative changes throughout the thoracic spine. No visible fracture signs of static malalignment. Upper thoracic spine with limited assessment due to overlap on lateral projection. Mid and lower thoracic spine are well evaluated. IMPRESSION: 1. Signs of degenerative changes throughout the thoracic spine. 2. No visible fracture or static malalignment,  mildly limited assessment as below. 3. Limited evaluation of the upper thoracic spine due to a bony structures and presence of vascular congestion and pulmonary edema seen on previous imaging. Electronically Signed   By: Zetta Bills M.D.   On: 08/24/2021 16:49   ?Medications: ? cefTRIAXone (ROCEPHIN)  IV Stopped (08/24/21 1244)  ? sodium thiosulfate infusion for calciphylaxis 25 g (08/23/21 1538)  ? ? amLODipine  2.5 mg Oral Daily  ? atorvastatin  40 mg Oral Daily  ? Chlorhexidine Gluconate Cloth  6 each Topical Q0600  ? gabapentin  100 mg Oral TID  ? nicotine  21 mg Transdermal Daily  ? pantoprazole  40 mg Oral BID AC  ? sevelamer carbonate  800 mg Oral TID WC  ? sodium chloride flush  3 mL Intravenous Q12H  ? ? ? ?OP HD: SW MWF ? 4h 450/500  115.5kg  2/2 bath  LUA AVF   Hep none ? - 4/12 > HBsAg neg w/ Abs > 10/ protective ? - last  HD 4.21 post 117kg ? - Hb 7.2 on 4/21, tsat 11%, ferr 67, pth 165 ? - last mircera 225 ug on 4/05 ? - Na thio 25 gm each HD ?  ?  ?#Recurrent GI bleed/acute on chronic anemia: Received a unit of blood transfusion in ER.  GI is following. EGD 08/24/2021.. Moderate portal hypertensive gastropathy was found in the entire examined stomach. Minimal contact ?bleeding noted in the cardia.Grade I varices were found in the distal esophagus.. Plan for capsule study 08/25/21  Hold ESA in setting of cancer. Monitor hemoglobin and transfuse as needed. ?  ?# ESRD MWF: Next HD 08/26/2021. ?  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  Avoid oral calcium or VDRF because of history of calciphylaxis.  DC PhosLo and start Renvela. ?  ?# HTN/volume: HD 04/28 Net UF2.8 Liters. Slightly under OP EDW if wts are accurate but needs further lowering of volume. Denies SOB. UF as tolerated. ?  ?# Calciphylaxis, ? 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  ? ?# Lung mass/Hepatocellular carcinoma- followed by Oncology. Would hold ESA and allow for management by oncology. Will DC ESA order in [facility] until more information available.  ? ?Jimmye Norman. Brown NP-C ?08/25/2021, 10:55 AM  ?[facility] ?[phone] ? ?Nephrology attending: ?I have personally seen and examined the patient.  Chart reviewed.  I agree with above. ?He is comfortable, without any complaint or concern.  No new event overnight. ?ESRD on HD readmitted with GI bleed.  He underwent small bowel enteroscopy on 4/29 without any evidence of active bleeding.  However there was portal hypertensive gastropathy and plan to do capsule endoscopy.  Currently on PPI.  He is tolerating dialysis well, plan for next HD tomorrow.  Continue current management. ? ?D.  Maddux Vanscyoc ?CKA ? ?  ? ?

## 2021-08-26 ENCOUNTER — Other Ambulatory Visit: Payer: Self-pay | Admitting: *Deleted

## 2021-08-26 ENCOUNTER — Encounter (HOSPITAL_COMMUNITY): Payer: Self-pay | Admitting: Gastroenterology

## 2021-08-26 DIAGNOSIS — E1169 Type 2 diabetes mellitus with other specified complication: Secondary | ICD-10-CM | POA: Diagnosis not present

## 2021-08-26 DIAGNOSIS — K922 Gastrointestinal hemorrhage, unspecified: Secondary | ICD-10-CM | POA: Diagnosis not present

## 2021-08-26 DIAGNOSIS — D649 Anemia, unspecified: Secondary | ICD-10-CM | POA: Diagnosis not present

## 2021-08-26 DIAGNOSIS — I10 Essential (primary) hypertension: Secondary | ICD-10-CM | POA: Diagnosis not present

## 2021-08-26 LAB — CBC
HCT: 25.3 % — ABNORMAL LOW (ref 39.0–52.0)
Hemoglobin: 8.4 g/dL — ABNORMAL LOW (ref 13.0–17.0)
MCH: 32.9 pg (ref 26.0–34.0)
MCHC: 33.2 g/dL (ref 30.0–36.0)
MCV: 99.2 fL (ref 80.0–100.0)
Platelets: 153 10*3/uL (ref 150–400)
RBC: 2.55 MIL/uL — ABNORMAL LOW (ref 4.22–5.81)
RDW: 19.5 % — ABNORMAL HIGH (ref 11.5–15.5)
WBC: 5.8 10*3/uL (ref 4.0–10.5)
nRBC: 0 % (ref 0.0–0.2)

## 2021-08-26 LAB — RENAL FUNCTION PANEL
Albumin: 2.3 g/dL — ABNORMAL LOW (ref 3.5–5.0)
Anion gap: 17 — ABNORMAL HIGH (ref 5–15)
BUN: 33 mg/dL — ABNORMAL HIGH (ref 8–23)
CO2: 22 mmol/L (ref 22–32)
Calcium: 8.5 mg/dL — ABNORMAL LOW (ref 8.9–10.3)
Chloride: 91 mmol/L — ABNORMAL LOW (ref 98–111)
Creatinine, Ser: 7.1 mg/dL — ABNORMAL HIGH (ref 0.61–1.24)
GFR, Estimated: 8 mL/min — ABNORMAL LOW (ref >60)
Glucose, Bld: 85 mg/dL (ref 70–99)
Phosphorus: 6.5 mg/dL — ABNORMAL HIGH (ref 2.5–4.6)
Potassium: 4.5 mmol/L (ref 3.5–5.1)
Sodium: 130 mmol/L — ABNORMAL LOW (ref 135–145)

## 2021-08-26 MED ORDER — ALBUMIN HUMAN 25 % IV SOLN
INTRAVENOUS | Status: AC
  Administered 2021-08-26: 25 g
  Filled 2021-08-26: qty 100

## 2021-08-26 NOTE — Patient Outreach (Signed)
Daniel [name] is still hospitalized. He states he may go home tomorrow. NP to outreach tomorrow afternoon, pt agrees to contact. ? ?[name] Carmelia Tiner, MSN, GNP-BC ?Gerontological Nurse Practitioner ?[facility] ?[phone] ? ?

## 2021-08-26 NOTE — Progress Notes (Signed)
? ?[facility] ?PROGRESS NOTE ? ? ?Daniel [name] QPR:[id number] DOB: 1961/01/24 DOA: 08/23/2021 ? ?PCP: [name], NP ? ?Brief History/Interval Summary: 61 y.o. male with medical history significant for ESRD on HD(MWF), hypertension, CHF, PAD, anemia, GI bleed, left BKA, chronic hepatitis C with concern cirrhosis, GERD with esophagitis, and concern for hepatocellular carcinoma who presents with complaints of dark black stools.  Patient has had multiple hospitalizations for similar issue recently.  Hospitalized from April 21 to April 24 for cellulitis.  Hospitalized from April 10 to April 15 due to GI bleed and underwent upper endoscopy at that time. ?Patient was hospitalized for further management.  Gastroenterology has been consulted.  ? ?Consultants: Gastroenterology ? ?Procedures:   ?Small bowel Enteroscopy ?Impression:               - The examined portion of the jejunum was normal.  ?                          Distal extent of the exam tattooed. ?                          - A few duodenal polyps. ?                          - Grade I esophageal varices. ?                          - Portal hypertensive gastropathy. ?                          - No specimens collected. ? ?Capsule study ?Results are pending ? ? ?Subjective/Interval History: ?Patient mentions that his back is still troubling him.  Denies any other complaints.  No further episodes of black stool.   ? ? ? ?Assessment/Plan: ? ?Symptomatic anemia ?Transfused 1 unit of PRBC during this admission.  Monitor hemoglobin closely.  Anemia likely secondary to GI bleed. ?Hemoglobin stable for the most part. ? ?GI bleed with melena ?Gastroenterology is following.  Patient presented with melanotic stool.  He was still taking Plavix even though he was supposed to be off of it.   ?He underwent small bowel enteroscopy on 4/29 which did not show any active bleeding.  Portal hypertensive gastropathy was noted.   ?Patient underwent capsule endoscopy  yesterday.  Results are pending. ? ?Upper back pain ?Developed back pain about 2 days prior to admission.  Worse with movement.  Denies any falls or injury.  Appears to be musculoskeletal. ?Previous imaging studies reviewed.  He had a thoracic spine MRI in 2022 which revealed a moderate size right paracentral disc extrusion at T6-T7 which was thought to be chronic.  Spinal stenosis was also noted.  Other disc bulging were also noted in T9-T10 through T12-L1.  Degenerative changes are also noted. ?Patient does not have any neurological deficits.  No chest pain.   ?Patient was given pain medications with some improvement.  Thoracic films did show degenerative changes but views were limited.   ?Since there is no significant improvement in his pain we will proceed with MRI of his thoracic spine.    ? ?End Stage renal disease on hemodialysis ?Dialyzed Monday Wednesday Friday.  Nephrology is following.   ? ?Recent cellulitis of left lower extremity/status post left BKA previously ?Was  discharged on Augmentin and doxycycline.   CRP 1.3.  WBC is normal.  No evidence for infection in his left lower extremity stump.  Patient was not continued on his antibiotics. ? ?Essential hypertension ?Continue amlodipine. ? ?Chronic hepatitis C/mild transaminitis ?Hepatitis C virus quantitative noted to be 9.88 million on 4/12.  Outpatient follow-up with GI. ? ?Hepatocellular carcinoma ?Recently diagnosed via MRI.  Oncology is supposed to follow in the outpatient setting, [name]. ? ?Lung mass ?Followed by Dr. Julien Nordmann and Dr. Valeta Harms.  Recent PET scan.  Outpatient management. ? ?Peripheral artery disease ?S/p SFA stenting on 4/13.  He had been on aspirin and Plavix, but Plavix was stopped during hospitalization earlier this month. Vascular surgery had recommended to continue only on aspirin.  Patient apparently did not recall this. ?Should continue only aspirin at discharge. ? ?Tobacco abuse ?Patient reports smoking half pack cigarettes  per day on average.  He was counseled ?  ?Obesity ?Estimated body mass index is 35.64 kg/m? as calculated from the following: ?  Height as of this encounter: '5\' 11"'$  (1.803 m). ?  Weight as of this encounter: 115.9 kg. ? ? ?DVT Prophylaxis: SCDs ?Code Status: Full code ?Family Communication: Discussed with patient ?Disposition Plan: Hopefully return home when improved ? ?Status is: Inpatient ?Remains inpatient appropriate because: Concern for GI bleed ? ? ? ?Medications: Scheduled: ? amLODipine  2.5 mg Oral Daily  ? atorvastatin  40 mg Oral Daily  ? Chlorhexidine Gluconate Cloth  6 each Topical Q0600  ? gabapentin  100 mg Oral TID  ? LORazepam  0.5 mg Intravenous Once  ? nicotine  21 mg Transdermal Daily  ? pantoprazole  40 mg Oral BID AC  ? sevelamer carbonate  800 mg Oral TID WC  ? sodium chloride flush  3 mL Intravenous Q12H  ? ?Continuous: ? cefTRIAXone (ROCEPHIN)  IV 1 g (08/25/21 1347)  ? sodium thiosulfate infusion for calciphylaxis 25 g (08/23/21 1538)  ? ?[id number] **OR** acetaminophen, albuterol, hydrALAZINE, HYDROcodone-acetaminophen, melatonin, methocarbamol ? ?Antibiotics: ?Anti-infectives (From admission, onward)  ? ? Start     Dose/Rate Route Frequency Ordered Stop  ? 08/24/21 1215  cefTRIAXone (ROCEPHIN) 1 g in sodium chloride 0.9 % 100 mL IVPB       ? 1 g ?200 mL/hr over 30 Minutes Intravenous Every 24 hours 08/24/21 1123    ? ?  ? ? ?Objective: ? ?Vital Signs ? ?Vitals:  ? 08/25/21 1834 08/25/21 2130 08/26/21 0510 08/26/21 0840  ?BP: (!) 155/55 (!) 171/95 (!) 163/97 (!) 159/91  ?Pulse: 87 93 88 89  ?Resp: '18 18 15 15  '$ ?Temp: 97.8 ?F (36.6 ?C) 97.8 ?F (36.6 ?C) 98 ?F (36.7 ?C) 98.3 ?F (36.8 ?C)  ?TempSrc: Oral Oral Oral Oral  ?SpO2: 99% 98% 100% 96%  ?Weight:   115.9 kg   ?Height:      ? ? ?Intake/Output Summary (Last 24 hours) at 08/26/2021 1031 ?Last data filed at 08/26/2021 0715 ?Gross per 24 hour  ?Intake 914 ml  ?Output 530 ml  ?Net 384 ml  ? ? ?Filed Weights  ? 08/24/21 0508 08/24/21 2108  08/26/21 0510  ?Weight: 114.6 kg 116.6 kg 115.9 kg  ? ? ?General appearance: Awake alert.  In no distress ?Resp: Clear to auscultation bilaterally.  Normal effort ?Cardio: S1-S2 is normal regular.  No S3-S4.  No rubs murmurs or bruit ?GI: Abdomen is soft.  Nontender nondistended.  Bowel sounds are present normal.  No masses organomegaly ?Examination of the back reveals tenderness  over the upper back in the midline around T3-T5. ?Neurologic: Alert and oriented x3.  No focal neurological deficits.  ? ? ? ?Lab Results: ? ?Data Reviewed: I have personally reviewed following labs and reports of the imaging studies ? ?CBC: ?Recent Labs  ?Lab 08/19/21 ?1330 08/23/21 ?0998 08/23/21 ?1748 08/24/21 ?0424 08/26/21 ?3382  ?WBC  --  5.3  --  5.6 5.8  ?HGB 9.1* 7.4* 8.5* 8.1* 8.4*  ?HCT 27.2* 23.4* 25.4* 24.7* 25.3*  ?MCV  --  101.7*  --  98.0 99.2  ?PLT  --  159  --  145* 153  ? ? ? ?Basic Metabolic Panel: ?Recent Labs  ?Lab 08/23/21 ?5053 08/24/21 ?0301 08/25/21 ?0121 08/26/21 ?9767  ?NA 140 134* 130* 130*  ?K 4.3 3.8 4.0 4.5  ?CL 104 98 93* 91*  ?CO2 '24 26 24 22  '$ ?GLUCOSE 109* 81 81 85  ?BUN 35* 15 24* 33*  ?CREATININE 7.97* 4.34* 5.45* 7.10*  ?CALCIUM 9.1 8.4* 8.7* 8.5*  ?PHOS  --  3.1 4.8* 6.5*  ? ? ? ?GFR: ?Estimated Creatinine Clearance: 14.1 mL/min (A) (by C-G formula based on SCr of 7.1 mg/dL (H)). ? ?Liver Function Tests: ?Recent Labs  ?Lab 08/23/21 ?3419 08/24/21 ?0301 08/25/21 ?0121 08/26/21 ?3790  ?AST 62*  --   --   --   ?ALT 21  --   --   --   ?ALKPHOS 93  --   --   --   ?BILITOT 0.6  --   --   --   ?PROT 7.5  --   --   --   ?ALBUMIN 2.1* 2.1* 2.2* 2.3*  ? ? ? ? ?CBG: ?Recent Labs  ?Lab 08/19/21 ?1239 08/24/21 ?0506 08/24/21 ?0732 08/24/21 ?2409  ?GLUCAP 115* 78 82 106*  ? ? ? ? ?Recent Results (from the past 240 hour(s))  ?Blood Culture (routine x 2)     Status: None  ? Collection Time: 08/16/21  8:33 PM  ? Specimen: BLOOD  ?Result Value Ref Range Status  ? Specimen Description BLOOD RIGHT ANTECUBITAL  Final  ?  Special Requests   Final  ?  BOTTLES DRAWN AEROBIC AND ANAEROBIC Blood Culture results may not be optimal due to an excessive volume of blood received in culture bottles  ? Culture   Final  ?  NO GROWTH 5 DAY

## 2021-08-26 NOTE — Progress Notes (Signed)
?  Transition of Care (TOC) Screening Note ? ? ?Patient Details  ?Name: Daniel [name] ?Date of Birth: Apr 01, 1961 ? ? ?Transition of Care (TOC) CM/SW Contact:    ?Tom-Johnson, Renea Ee, RN ?Phone Number: ?08/26/2021, 12:31 PM ? ?Patient is admitted for GI Bleed. From home alone. Has two children that lives out of state. States he has two siblings but does not have a good family support.  ?Drives self to and from dialysis and to his other appointments. Has a walker and wheelchair at home. ?PCP is Oletta Lamas, Milford Cage, NP and uses [facility] on [address].  ?Requesting PTAR at discharge. No PT/OT recommendations noted at this time. CM will continue to follow with needs.  ?

## 2021-08-26 NOTE — Progress Notes (Signed)
?[facility] ?Progress Note  ? ?Subjective: Seen in room. No c/o's.  ? ?Objective ?Vitals:  ? 08/25/21 1834 08/25/21 2130 08/26/21 0510 08/26/21 0840  ?BP: (!) 155/55 (!) 171/95 (!) 163/97 (!) 159/91  ?Pulse: 87 93 88 89  ?Resp: '18 18 15 15  '$ ?Temp: 97.8 ?F (36.6 ?C) 97.8 ?F (36.6 ?C) 98 ?F (36.7 ?C) 98.3 ?F (36.8 ?C)  ?TempSrc: Oral Oral Oral Oral  ?SpO2: 99% 98% 100% 96%  ?Weight:   115.9 kg   ?Height:      ? ?Physical Exam ?General: Chronically ill appearing male in NAD ?Heart:s1,S2 RRR NO M/R/G ?Lungs: CTAB Anteriorly ?Abdomen: obese, NABS, NT ?Extremities: L BKA with trace stump edema, RLE with trace pre tib edema.  ?Dialysis Access: L AVF + T/B ? ? ?Additional Objective ?Labs: ?Basic Metabolic Panel: ?Recent Labs  ?Lab 08/24/21 ?0301 08/25/21 ?0121 08/26/21 ?2637  ?NA 134* 130* 130*  ?K 3.8 4.0 4.5  ?CL 98 93* 91*  ?CO2 '26 24 22  '$ ?GLUCOSE 81 81 85  ?BUN 15 24* 33*  ?CREATININE 4.34* 5.45* 7.10*  ?CALCIUM 8.4* 8.7* 8.5*  ?PHOS 3.1 4.8* 6.5*  ? ? ?Liver Function Tests: ?Recent Labs  ?Lab 08/23/21 ?8588 08/24/21 ?0301 08/25/21 ?0121 08/26/21 ?5027  ?AST 62*  --   --   --   ?ALT 21  --   --   --   ?ALKPHOS 93  --   --   --   ?BILITOT 0.6  --   --   --   ?PROT 7.5  --   --   --   ?ALBUMIN 2.1* 2.1* 2.2* 2.3*  ? ? ?No results for input(s): LIPASE, AMYLASE in the last 168 hours. ?CBC: ?Recent Labs  ?Lab 08/23/21 ?7412 08/23/21 ?1748 08/24/21 ?0424 08/26/21 ?8786  ?WBC 5.3  --  5.6 5.8  ?HGB 7.4* 8.5* 8.1* 8.4*  ?HCT 23.4* 25.4* 24.7* 25.3*  ?MCV 101.7*  --  98.0 99.2  ?PLT 159  --  145* 153  ? ? ?Blood Culture ?   ?Component Value Date/Time  ? SDES BLOOD BLOOD RIGHT HAND 08/16/2021 2049  ? SPECREQUEST  08/16/2021 2049  ?  BOTTLES DRAWN AEROBIC AND ANAEROBIC Blood Culture adequate volume  ? CULT  08/16/2021 2049  ?  NO GROWTH 5 DAYS ?Performed at [facility], [place] [address] [place], [place] [address] ?  ? REPTSTATUS 08/21/2021 FINAL 08/16/2021 2049  ? ? ?Cardiac Enzymes: ?No results for  input(s): CKTOTAL, CKMB, CKMBINDEX, TROPONINI in the last 168 hours. ?CBG: ?Recent Labs  ?Lab 08/19/21 ?1239 08/24/21 ?0506 08/24/21 ?0732 08/24/21 ?9470  ?GLUCAP 115* 78 82 106*  ? ? ?Iron Studies: No results for input(s): IRON, TIBC, TRANSFERRIN, FERRITIN in the last 72 hours. ?'@lablastinr3'$ @ ?Studies/Results: ?DG Thoracic Spine 2 View ? ?Result Date: 08/24/2021 ?CLINICAL DATA:  Pain in the thoracic spine without reported history of trauma. EXAM: THORACIC SPINE 2 VIEWS COMPARISON:  Prior PET exam from July 23, 2021. FINDINGS: Spinal alignment is maintained. Signs of degenerative changes throughout the thoracic spine. No visible fracture signs of static malalignment. Upper thoracic spine with limited assessment due to overlap on lateral projection. Mid and lower thoracic spine are well evaluated. IMPRESSION: 1. Signs of degenerative changes throughout the thoracic spine. 2. No visible fracture or static malalignment, mildly limited assessment as below. 3. Limited evaluation of the upper thoracic spine due to a bony structures and presence of vascular congestion and pulmonary edema seen on previous imaging. Electronically Signed  By: Zetta Bills M.D.   On: 08/24/2021 16:49   ?Medications: ? cefTRIAXone (ROCEPHIN)  IV 1 g (08/25/21 1347)  ? sodium thiosulfate infusion for calciphylaxis 25 g (08/23/21 1538)  ? ? amLODipine  2.5 mg Oral Daily  ? atorvastatin  40 mg Oral Daily  ? Chlorhexidine Gluconate Cloth  6 each Topical Q0600  ? gabapentin  100 mg Oral TID  ? LORazepam  0.5 mg Intravenous Once  ? nicotine  21 mg Transdermal Daily  ? pantoprazole  40 mg Oral BID AC  ? sevelamer carbonate  800 mg Oral TID WC  ? sodium chloride flush  3 mL Intravenous Q12H  ? ? ? ?OP HD: SW MWF ? 4h 450/500  115.5kg  2/2 bath  LUA AVF   Hep none ? - 4/12 > HBsAg neg w/ Abs > 10/ protective ? - last HD 4.21 post 117kg ? - Hb 7.2 on 4/21, tsat 11%, ferr 67, pth 165 ? - last mircera 225 ug on 4/05 ? - Na thio 25 gm each HD ?  ?   ?Assessment/ Plan: ?#Recurrent GI bleed/acute on chronic anemia: Received a unit of blood transfusion in ER.  GI is following. EGD 08/24/2021.. Moderate portal hypertensive gastropathy was found in the entire examined stomach. Minimal contact bleeding noted in the cardia.Grade I varices were found in the distal esophagus. Plan for capsule study 08/25/21.  Hold ESA in setting of cancer. Monitor hemoglobin and transfuse as needed. ?  ?# ESRD MWF: Next HD today.  ?  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  Avoid oral calcium or VDRF because of history of calciphylaxis.  DC PhosLo and start Renvela. ?  ?# HTN/volume: HD 04/28 Net UF2.8 Liters. Slightly under OP EDW if wts are accurate but needs further lowering of volume. Denies SOB. UF as tolerated. ?  ?# Calciphylaxis, ? 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  ? ?# Lung mass/Hepatocellular carcinoma- followed by Oncology. Would hold ESA and allow for management by oncology. Will DC ESA order in [facility] until more information available.  ? ?Kelly Splinter, MD ?08/26/2021, 11:50 AM ? ? ? ? ? ?  ? ?

## 2021-08-26 NOTE — Progress Notes (Signed)
? ? ? ? Progress Note ? ? Subjective  ?Patient denies any bleeding symptoms. In HD this afternoon. States he just started experiencing LLQ pain. Otherwise is at baseline ? ? ? Objective  ? ?Vital signs in last 24 hours: ?Temp:  [97.7 ?F (36.5 ?C)-98.3 ?F (36.8 ?C)] 97.7 ?F (36.5 ?C) (05/01 1230) ?Pulse Rate:  [87-93] 91 (05/01 1444) ?Resp:  [15-18] 15 (05/01 1234) ?BP: (68-182)/(46-97) 150/61 (05/01 1444) ?SpO2:  [96 %-100 %] 100 % (05/01 1230) ?Weight:  [115.9 kg] 115.9 kg (05/01 0510) ?Last BM Date : 08/24/21 ?General:    AA male in NAD ?Abdomen:  Soft, some LLQ tenderness, nondistended.  ?Neurologic:  Alert and oriented,  grossly normal neurologically. ?Psych:  Cooperative. Normal mood and affect. ? ?Intake/Output from previous day: ?04/30 0701 - 05/01 0700 ?In: 604 [P.O.:594; I.V.:10] ?Out: 530 [Urine:530] ?Intake/Output this shift: ?Total I/O ?In: 320 [P.O.:320] ?Out: 525 [Urine:525] ? ?Lab Results: ?Recent Labs  ?  08/23/21 ?7829 08/24/21 ?0424 08/26/21 ?5621  ?WBC  --  5.6 5.8  ?HGB 8.5* 8.1* 8.4*  ?HCT 25.4* 24.7* 25.3*  ?PLT  --  145* 153  ? ?BMET ?Recent Labs  ?  08/24/21 ?0301 08/25/21 ?0121 08/26/21 ?3086  ?NA 134* 130* 130*  ?K 3.8 4.0 4.5  ?CL 98 93* 91*  ?CO2 '26 24 22  '$ ?GLUCOSE 81 81 85  ?BUN 15 24* 33*  ?CREATININE 4.34* 5.45* 7.10*  ?CALCIUM 8.4* 8.7* 8.5*  ? ?LFT ?Recent Labs  ?  08/26/21 ?5784  ?ALBUMIN 2.3*  ? ?PT/INR ?No results for input(s): LABPROT, INR in the last 72 hours. ? ?Studies/Results: ?DG Thoracic Spine 2 View ? ?Result Date: 08/24/2021 ?CLINICAL DATA:  Pain in the thoracic spine without reported history of trauma. EXAM: THORACIC SPINE 2 VIEWS COMPARISON:  Prior PET exam from July 23, 2021. FINDINGS: Spinal alignment is maintained. Signs of degenerative changes throughout the thoracic spine. No visible fracture signs of static malalignment. Upper thoracic spine with limited assessment due to overlap on lateral projection. Mid and lower thoracic spine are well evaluated. IMPRESSION:  1. Signs of degenerative changes throughout the thoracic spine. 2. No visible fracture or static malalignment, mildly limited assessment as below. 3. Limited evaluation of the upper thoracic spine due to a bony structures and presence of vascular congestion and pulmonary edema seen on previous imaging. Electronically Signed   By: Zetta Bills M.D.   On: 08/24/2021 16:49   ? ? ? ? Assessment / Plan:   ? ?61 y/o male with a history of recurrent GI bleeding / IDA on Plavix previously, admitted with acute on chronic anemia and recurrent dark stools. He has had a extensive workup in the past few years with multiple upper tract exams, colonoscopy, flex sig. Most recently underwent enteroscopy, no overt pathology noted, he does have stable duodenal polyps which are benign. Small bowel tattooed and then underwent capsule study.  ? ?Capsule study interpreted today. There is no active bleeding. Stable appearing duodenal polyps. Small red spot vs. diminutive AVM in the bulb but no bleeding. The prep is fair, dark / green secretions noted throughout his bowel which prohibited good views in some areas. I could not see the recently placed tattoo. Prep was worst in the distal small bowel. Overall no mass lesions or heme noted in the bowel, no bleeding, but capsule study is nondiagnostic. ? 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 I spoke with Dr. Maryland Pink about the situation and plan moving forward. Apparently Plavix has been stopped previously but patient did not realize this and continued it as outpatient. They plan on holding Plavix moving forward and this alone may prevent recurrent bleeding. Okay to take baby aspirin. He should have Hgb monitored closely moving forward. If he has recurrent bleeding despite this, could consider octreotide depot injection to prevent bleeding from AVMs as the  suspected source. Patient in agreement with the plan. ? ?Otherwise, of note, having some LLQ discomfort that just started when seeing the patient. If this persists / worsens consider imaging, discussed with nursing staff. ? ?Will sign off for now, please call with questions moving forward. ? ?Jolly Mango, MD ?[facility] ?  ? ?

## 2021-08-26 NOTE — Progress Notes (Addendum)
Pt receives out-pt HD at [facility] on MWF. Contacted clinic to confirm pt's chair time and awaiting response. Will assist as needed.  ? ?Daniel [name] ?Renal Navigator ?[phone] ? ?Addendum at 2:03 pm: ?Pt has an 11:15 chair time. ? ?

## 2021-08-26 NOTE — Care Management Important Message (Signed)
Important Message ? ?Patient Details  ?Name: Daniel [name] ?MRN: [id number] ?Date of Birth: 1960/07/26 ? ? ?Medicare Important Message Given:  Yes ? ? ? ? ?Mikenna Bunkley ?08/26/2021, 4:13 PM ?

## 2021-08-27 ENCOUNTER — Inpatient Hospital Stay (HOSPITAL_COMMUNITY): Payer: Medicare Other

## 2021-08-27 ENCOUNTER — Encounter: Payer: Medicare Other | Admitting: Thoracic Surgery (Cardiothoracic Vascular Surgery)

## 2021-08-27 ENCOUNTER — Encounter (HOSPITAL_COMMUNITY): Payer: Self-pay | Admitting: *Deleted

## 2021-08-27 ENCOUNTER — Other Ambulatory Visit: Payer: Self-pay | Admitting: *Deleted

## 2021-08-27 DIAGNOSIS — Z89512 Acquired absence of left leg below knee: Secondary | ICD-10-CM

## 2021-08-27 DIAGNOSIS — N186 End stage renal disease: Secondary | ICD-10-CM | POA: Diagnosis not present

## 2021-08-27 DIAGNOSIS — Z992 Dependence on renal dialysis: Secondary | ICD-10-CM | POA: Diagnosis not present

## 2021-08-27 DIAGNOSIS — I739 Peripheral vascular disease, unspecified: Secondary | ICD-10-CM

## 2021-08-27 LAB — CBC
HCT: 24.5 % — ABNORMAL LOW (ref 39.0–52.0)
Hemoglobin: 8.3 g/dL — ABNORMAL LOW (ref 13.0–17.0)
MCH: 32.8 pg (ref 26.0–34.0)
MCHC: 33.9 g/dL (ref 30.0–36.0)
MCV: 96.8 fL (ref 80.0–100.0)
Platelets: 148 10*3/uL — ABNORMAL LOW (ref 150–400)
RBC: 2.53 MIL/uL — ABNORMAL LOW (ref 4.22–5.81)
RDW: 19.4 % — ABNORMAL HIGH (ref 11.5–15.5)
WBC: 5.5 10*3/uL (ref 4.0–10.5)
nRBC: 0.4 % — ABNORMAL HIGH (ref 0.0–0.2)

## 2021-08-27 LAB — RENAL FUNCTION PANEL
Albumin: 2.3 g/dL — ABNORMAL LOW (ref 3.5–5.0)
Anion gap: 17 — ABNORMAL HIGH (ref 5–15)
BUN: 14 mg/dL (ref 8–23)
CO2: 22 mmol/L (ref 22–32)
Calcium: 8.2 mg/dL — ABNORMAL LOW (ref 8.9–10.3)
Chloride: 95 mmol/L — ABNORMAL LOW (ref 98–111)
Creatinine, Ser: 4.19 mg/dL — ABNORMAL HIGH (ref 0.61–1.24)
GFR, Estimated: 15 mL/min — ABNORMAL LOW (ref >60)
Glucose, Bld: 100 mg/dL — ABNORMAL HIGH (ref 70–99)
Phosphorus: 3.2 mg/dL (ref 2.5–4.6)
Potassium: 3.7 mmol/L (ref 3.5–5.1)
Sodium: 134 mmol/L — ABNORMAL LOW (ref 135–145)

## 2021-08-27 MED ORDER — DEXAMETHASONE SODIUM PHOSPHATE 4 MG/ML IJ SOLN
4.0000 mg | Freq: Once | INTRAMUSCULAR | Status: AC
  Administered 2021-08-27: 4 mg via INTRAVENOUS
  Filled 2021-08-27: qty 1

## 2021-08-27 MED ORDER — METHOCARBAMOL 500 MG PO TABS
500.0000 mg | ORAL_TABLET | Freq: Four times a day (QID) | ORAL | 0 refills | Status: DC | PRN
Start: 2021-08-27 — End: 2021-10-05

## 2021-08-27 MED ORDER — PANTOPRAZOLE SODIUM 40 MG PO TBEC: 40.0000 mg | DELAYED_RELEASE_TABLET | Freq: Two times a day (BID) | ORAL | 2 refills | Status: DC

## 2021-08-27 MED ORDER — HYDROCODONE-ACETAMINOPHEN 5-325 MG PO TABS: 1.0000 | ORAL_TABLET | ORAL | 0 refills | Status: DC | PRN

## 2021-08-27 NOTE — Progress Notes (Signed)
Daniel [name] to be D/C'd Home per MD order.  Discussed with the patient and all questions fully answered. ? ?IV catheter discontinued intact. Site without signs and symptoms of complications. Dressing and pressure applied. ? ?An After Visit Summary was printed and given to the patient. Patient prescriptions sent to pharmacy. ? ?D/c education completed with patient/family including follow up instructions, medication list, d/c activities limitations if indicated, with other d/c instructions as indicated by MD - patient able to verbalize understanding, all questions fully answered.  ? ?Patient instructed to return to ED, call 911, or call MD for any changes in condition.  ? ?Patient escorted via [place], and D/C home via taxi service. ? ?Daniel [name] ?08/27/2021 6:42 PM  ?

## 2021-08-27 NOTE — Progress Notes (Addendum)
Pt is due HD tomorrow and neurosurgery has been consulted per attending note. Contacted inpt HD unit and requested that pt be placed on 2nd shift tomorrow if possible in the event neurosurgery clears pt for d/c. If pt happens to be cleared for d/c in the am, would attempt for pt to receive out-pt HD to avoid HD on day of d/c. Contacted [facility] and spoke to Hilda Blades to advise clinic that pt may or may not be at clinic tomorrow pending neurosurgery consult. Will assist as needed.  ? ?Melven Sartorius ?Renal Navigator ?[phone] ? ?Addendum at 3:42 pm: ?Pt seen by neurosurgery and to d/c today. Contacted [facility] and spoke to [place] to make clinic aware of pt's d/c today and that pt will resume care tomorrow.  ?

## 2021-08-27 NOTE — Progress Notes (Signed)
?[facility] ?Progress Note  ? ?Subjective: Completed HD yesterday. Net UF 3L. Denies chest pain, dyspnea. Says he's going home today.  ? ?Objective ?Vitals:  ? 08/26/21 1635 08/26/21 2111 08/27/21 0519 08/27/21 0739  ?BP: (!) 164/56 (!) 154/73 (!) 162/92 (!) 165/92  ?Pulse: 87 92 96 95  ?Resp: '15 17 16 16  '$ ?Temp: 97.7 ?F (36.5 ?C) 98.5 ?F (36.9 ?C) 98.6 ?F (37 ?C) 98.8 ?F (37.1 ?C)  ?TempSrc: Temporal Oral Oral Oral  ?SpO2: 98% 96% 96% 97%  ?Weight: 112 kg  112.7 kg   ?Height:      ?  ? ? ?Additional Objective ?Labs: ?Basic Metabolic Panel: ?Recent Labs  ?Lab 08/25/21 ?0121 08/26/21 ?7829 08/27/21 ?0328  ?NA 130* 130* 134*  ?K 4.0 4.5 3.7  ?CL 93* 91* 95*  ?CO2 '24 22 22  '$ ?GLUCOSE 81 85 100*  ?BUN 24* 33* 14  ?CREATININE 5.45* 7.10* 4.19*  ?CALCIUM 8.7* 8.5* 8.2*  ?PHOS 4.8* 6.5* 3.2  ? ?CBC: ?Recent Labs  ?Lab 08/23/21 ?5621 08/23/21 ?1748 08/24/21 ?0424 08/26/21 ?3086 08/27/21 ?0328  ?WBC 5.3  --  5.6 5.8 5.5  ?HGB 7.4*   < > 8.1* 8.4* 8.3*  ?HCT 23.4*   < > 24.7* 25.3* 24.5*  ?MCV 101.7*  --  98.0 99.2 96.8  ?PLT 159  --  145* 153 148*  ? < > = values in this interval not displayed.  ? ?Blood Culture ?   ?Component Value Date/Time  ? SDES BLOOD BLOOD RIGHT HAND 08/16/2021 2049  ? SPECREQUEST  08/16/2021 2049  ?  BOTTLES DRAWN AEROBIC AND ANAEROBIC Blood Culture adequate volume  ? CULT  08/16/2021 2049  ?  NO GROWTH 5 DAYS ?Performed at [facility], [place] [address], [place], [place] [address] ?  ? REPTSTATUS 08/21/2021 FINAL 08/16/2021 2049  ? ? ? ?Physical Exam ?General: Alert, nad ?Heart: RRR No m,r,g  ?Lungs: Clear bilaterally  ?Abdomen: soft non-tender ?Extremities: L BKA, trace edema  ?Dialysis Access: LUE AVF +bruit  ? ?Medications: ? cefTRIAXone (ROCEPHIN)  IV Stopped (08/26/21 1207)  ? sodium thiosulfate infusion for calciphylaxis Stopped (08/26/21 1624)  ? ? amLODipine  2.5 mg Oral Daily  ? atorvastatin  40 mg Oral Daily  ? Chlorhexidine Gluconate Cloth  6 each Topical Q0600  ?  gabapentin  100 mg Oral TID  ? LORazepam  0.5 mg Intravenous Once  ? nicotine  21 mg Transdermal Daily  ? pantoprazole  40 mg Oral BID AC  ? sevelamer carbonate  800 mg Oral TID WC  ? sodium chloride flush  3 mL Intravenous Q12H  ? ? ?Dialysis Orders:  ?SW MWF ? 4h 450/500  115.5kg  2/2 bath  LUA AVF   Hep none ? - 4/12 > HBsAg neg w/ Abs > 10/ protective ? - last HD 4.21 post 117kg ? - Hb 7.2 on 4/21, tsat 11%, ferr 67, pth 165 ? - last mircera 225 ug on 4/05 ? - Na thio 25 gm each HD ?  ? ?Assessment/Plan: ?#Recurrent GI bleed/acute on chronic anemia: Received a unit of blood transfusion in ER.  GI  consulted s/p EGD - Stable duodenal polyps. Moderate portal hypertensive gastropathy was found in the entire examined stomach. Minimal contact bleeding noted in the cardia.Grade I varices were found in the distal esophagus. No active bleeding on capsule study. Stopping Plavix. GI signed off 5/1.  Hold ESA in setting of cancer. Monitor hemoglobin and transfuse as needed. ?  ?#  ESRD MWF: Next HD 5/3.  ?  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  Avoid oral calcium or VDRF because of history of calciphylaxis.  DC PhosLo and start Renvela. ?  ?# HTN/volume: HD 5/1 Net UF 3Liters. Under OP EDW if wts are accurate but needs further lowering of volume. Denies SOB. UF as tolerated. ?  ?# Calciphylaxis, ? 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  ?  ?# Lung mass/Hepatocellular carcinoma- followed by Oncology. Would hold ESA and allow for management by oncology. Will DC ESA order in [place] until more information available.  ? ?Lynnda Child PA-C ?[facility] ?08/27/2021,10:42 AM ? ? ? ? ? ?  ?

## 2021-08-27 NOTE — Progress Notes (Signed)
? ?[facility] ?PROGRESS NOTE ? ? ?Daniel [name] YNW:[id number] DOB: 02/20/1961 DOA: 08/23/2021 ? ?PCP: [name], NP ? ?Brief History/Interval Summary: 61 y.o. male with medical history significant for ESRD on HD(MWF), hypertension, CHF, PAD, anemia, GI bleed, left BKA, chronic hepatitis C with concern cirrhosis, GERD with esophagitis, and concern for hepatocellular carcinoma who presents with complaints of dark black stools.  Patient has had multiple hospitalizations for similar issue recently.  Hospitalized from April 21 to April 24 for cellulitis.  Hospitalized from April 10 to April 15 due to GI bleed and underwent upper endoscopy at that time. ?Patient was hospitalized for further management.  Gastroenterology has been consulted.  ? ?Consultants: Gastroenterology ? ?Procedures:   ?Small bowel Enteroscopy ?Impression:               - The examined portion of the jejunum was normal.  ?                          Distal extent of the exam tattooed. ?                          - A few duodenal polyps. ?                          - Grade I esophageal varices. ?                          - Portal hypertensive gastropathy. ?                          - No specimens collected. ? ?Capsule study ?Duodenal polyps and diminutive AVM noted.  No active bleeding was present.  But due to poor prep this was a nondiagnostic test. ? ? ?Subjective/Interval History: ?Patient mentions that he continues to have pain in the upper back.  Denies any black stool.   ? ? ? ?Assessment/Plan: ? ?Symptomatic anemia ?Transfused 1 unit of PRBC during this admission.  Monitor hemoglobin closely.  Anemia likely secondary to GI bleed. ?Hemoglobin has been stable. ? ?GI bleed with melena ?Gastroenterology is following.  Patient presented with melanotic stool.  He was still taking Plavix even though he was supposed to be off of it.   ?He underwent small bowel enteroscopy on 4/29 which did not show any active bleeding.  Portal  hypertensive gastropathy was noted.   ?Patient underwent capsule endoscopy.  No active bleeding was noted.  Discussed with GI.  Plan will be to observe him off of Plavix. ?Hemoglobin to be monitored closely.  If he has recurrent bleeding then octreotide Depo injection to be considered. ? ?Upper back pain ?Developed back pain about 2 days prior to admission.  Worse with movement.  Denies any falls or injury.  Appears to be musculoskeletal. ?Previous imaging studies reviewed.  He had a thoracic spine MRI in 2022 which revealed a moderate size right paracentral disc extrusion at T6-T7 which was thought to be chronic.  Spinal stenosis was also noted.  Other disc bulging were also noted in T9-T10 through T12-L1.  Degenerative changes are also noted. ?Patient does not have any neurological deficits.  No chest pain.   ?Patient was given pain medications with some improvement.  Thoracic films did show degenerative changes but views were limited.   ?Since he continues  to have significant pain MRI was ordered.  Was done this morning.  Abnormal findings noted.  Discussed with Dr. Zada Finders with neurosurgery who will evaluate patient. ? ?End Stage renal disease on hemodialysis ?Dialyzed Monday Wednesday Friday.  Nephrology is following.   ? ?Recent cellulitis of left lower extremity/status post left BKA previously ?Was discharged on Augmentin and doxycycline.   CRP 1.3.  WBC is normal.  No evidence for infection in his left lower extremity stump.  Patient was not continued on his antibiotics.  No indication to continue them at discharge either. ? ?Essential hypertension ?Continue amlodipine. ? ?Chronic hepatitis C/mild transaminitis ?Hepatitis C virus quantitative noted to be 9.88 million on 4/12.  Outpatient follow-up with GI.  Noted to be on ceftriaxone for SBP prophylaxis.  Can be discontinued at discharge. ? ?Hepatocellular carcinoma ?Recently diagnosed via MRI.  Oncology is supposed to follow in the outpatient setting, Dr.  Burr Medico. ? ?Lung mass ?Followed by Dr. Julien Nordmann and Dr. Valeta Harms.  Recent PET scan.  Outpatient management. ? ?Peripheral artery disease ?S/p SFA stenting on 4/13.  He had been on aspirin and Plavix, but Plavix was stopped during hospitalization earlier this month. Vascular surgery had recommended to continue only on aspirin.  Patient apparently did not recall this. ?Should continue only aspirin at discharge. ? ?Tobacco abuse ?Patient reports smoking half pack cigarettes per day on average.  He was counseled ?  ?Obesity ?Estimated body mass index is 34.65 kg/m? as calculated from the following: ?  Height as of this encounter: '5\' 11"'$  (1.803 m). ?  Weight as of this encounter: 112.7 kg. ? ? ?DVT Prophylaxis: SCDs ?Code Status: Full code ?Family Communication: Discussed with patient ?Disposition Plan: Hopefully return home when improved ? ?Status is: Inpatient ?Remains inpatient appropriate because: Concern for GI bleed ? ? ? ?Medications: Scheduled: ? amLODipine  2.5 mg Oral Daily  ? atorvastatin  40 mg Oral Daily  ? Chlorhexidine Gluconate Cloth  6 each Topical Q0600  ? gabapentin  100 mg Oral TID  ? LORazepam  0.5 mg Intravenous Once  ? nicotine  21 mg Transdermal Daily  ? pantoprazole  40 mg Oral BID AC  ? sevelamer carbonate  800 mg Oral TID WC  ? sodium chloride flush  3 mL Intravenous Q12H  ? ?Continuous: ? cefTRIAXone (ROCEPHIN)  IV 1 g (08/27/21 1150)  ? sodium thiosulfate infusion for calciphylaxis Stopped (08/26/21 1624)  ? ?DGL:OVFIEPPIRJJOA **OR** acetaminophen, albuterol, hydrALAZINE, HYDROcodone-acetaminophen, melatonin, methocarbamol ? ?Antibiotics: ?Anti-infectives (From admission, onward)  ? ? Start     Dose/Rate Route Frequency Ordered Stop  ? 08/24/21 1215  cefTRIAXone (ROCEPHIN) 1 g in sodium chloride 0.9 % 100 mL IVPB       ? 1 g ?200 mL/hr over 30 Minutes Intravenous Every 24 hours 08/24/21 1123    ? ?  ? ? ?Objective: ? ?Vital Signs ? ?Vitals:  ? 08/26/21 1635 08/26/21 2111 08/27/21 0519 08/27/21 0739   ?BP: (!) 164/56 (!) 154/73 (!) 162/92 (!) 165/92  ?Pulse: 87 92 96 95  ?Resp: '15 17 16 16  '$ ?Temp: 97.7 ?F (36.5 ?C) 98.5 ?F (36.9 ?C) 98.6 ?F (37 ?C) 98.8 ?F (37.1 ?C)  ?TempSrc: Temporal Oral Oral Oral  ?SpO2: 98% 96% 96% 97%  ?Weight: 112 kg  112.7 kg   ?Height:      ? ? ?Intake/Output Summary (Last 24 hours) at 08/27/2021 1152 ?Last data filed at 08/27/2021 0915 ?Gross per 24 hour  ?Intake 740 ml  ?Output 3875 ml  ?  Net -3135 ml  ? ? ?Filed Weights  ? 08/26/21 1225 08/26/21 1635 08/27/21 0519  ?Weight: 115.7 kg 112 kg 112.7 kg  ? ? ?General appearance: Awake alert.  In no distress ?Resp: Clear to auscultation bilaterally.  Normal effort ?Cardio: S1-S2 is normal regular.  No S3-S4.  No rubs murmurs or bruit ?GI: Abdomen is soft.  Nontender nondistended.  Bowel sounds are present normal.  No masses organomegaly ?Extremities: No edema.  Full range of motion of lower extremities. ?Persistent tenderness in the midline in the upper back. ?Neurologic: Alert and oriented x3.  No focal neurological deficits.  ? ? ? ?Lab Results: ? ?Data Reviewed: I have personally reviewed following labs and reports of the imaging studies ? ?CBC: ?Recent Labs  ?Lab 08/23/21 ?7510 08/23/21 ?1748 08/24/21 ?0424 08/26/21 ?2585 08/27/21 ?0328  ?WBC 5.3  --  5.6 5.8 5.5  ?HGB 7.4* 8.5* 8.1* 8.4* 8.3*  ?HCT 23.4* 25.4* 24.7* 25.3* 24.5*  ?MCV 101.7*  --  98.0 99.2 96.8  ?PLT 159  --  145* 153 148*  ? ? ? ?Basic Metabolic Panel: ?Recent Labs  ?Lab 08/23/21 ?2778 08/24/21 ?0301 08/25/21 ?0121 08/26/21 ?2423 08/27/21 ?0328  ?NA 140 134* 130* 130* 134*  ?K 4.3 3.8 4.0 4.5 3.7  ?CL 104 98 93* 91* 95*  ?CO2 '24 26 24 22 22  '$ ?GLUCOSE 109* 81 81 85 100*  ?BUN 35* 15 24* 33* 14  ?CREATININE 7.97* 4.34* 5.45* 7.10* 4.19*  ?CALCIUM 9.1 8.4* 8.7* 8.5* 8.2*  ?PHOS  --  3.1 4.8* 6.5* 3.2  ? ? ? ?GFR: ?Estimated Creatinine Clearance: 23.6 mL/min (A) (by C-G formula based on SCr of 4.19 mg/dL (H)). ? ?Liver Function Tests: ?Recent Labs  ?Lab 08/23/21 ?5361  08/24/21 ?0301 08/25/21 ?0121 08/26/21 ?4431 08/27/21 ?0328  ?AST 62*  --   --   --   --   ?ALT 21  --   --   --   --   ?ALKPHOS 93  --   --   --   --   ?BILITOT 0.6  --   --   --   --   ?PROT 7.5  --   --   --   --

## 2021-08-27 NOTE — Discharge Summary (Signed)
?[facility] ? ?Physician Discharge Summary  ? ?Patient ID: ?Daniel [name] ?MRN: [id number] ?DOB/AGE: 10-04-1960 61 y.o. ? ?Admit date: 08/23/2021 ?Discharge date:   08/27/2021 ? ? ?PCP: [name], NP ? ?DISCHARGE DIAGNOSES:  ?Symptomatic anemia status post blood transfusion ?GI bleed with melanotic stool ?Upper back pain ?End-stage renal disease on hemodialysis ?Essential hypertension ?Chronic hepatitis C ?Hepatocellular carcinoma ?Lung mass ?Peripheral artery disease ? ? ?RECOMMENDATIONS FOR OUTPATIENT FOLLOW UP: ?Patient to keep all of his appointments this week ?Patient to keep his usual hemodialysis schedule ? ? ?Home Health: None ?Equipment/Devices: None ? ?CODE STATUS: Full code ? ?DISCHARGE CONDITION: fair ? ?Diet recommendation: As before ? ?INITIAL HISTORY: ?61 y.o. male with medical history significant for ESRD on HD(MWF), hypertension, CHF, PAD, anemia, GI bleed, left BKA, chronic hepatitis C with concern cirrhosis, GERD with esophagitis, and concern for hepatocellular carcinoma who presents with complaints of dark black stools.  Patient has had multiple hospitalizations for similar issue recently.  Hospitalized from April 21 to April 24 for cellulitis.  Hospitalized from April 10 to April 15 due to GI bleed and underwent upper endoscopy at that time. ?Patient was hospitalized for further management.  Gastroenterology has been consulted.  ?  ?Consultants: Gastroenterology.  Neurosurgery ?  ?Procedures:   ?Small bowel Enteroscopy ?Impression:               - The examined portion of the jejunum was normal.  ?                          Distal extent of the exam tattooed. ?                          - A few duodenal polyps. ?                          - Grade I esophageal varices. ?                          - Portal hypertensive gastropathy. ?                          - No specimens collected. ?  ?Capsule study ?Duodenal polyps and diminutive AVM noted.  No active bleeding was present.  But  due to poor prep this was a nondiagnostic test. ? ? ? ?HOSPITAL COURSE:  ? ?Symptomatic anemia ?Transfused 1 unit of PRBC during this admission.  Monitor hemoglobin closely.  Anemia likely secondary to GI bleed. ?Hemoglobin has been stable. ? ?GI bleed with melena ?Gastroenterology is following.  Patient presented with melanotic stool.  He was still taking Plavix even though he was supposed to be off of it.   ?He underwent small bowel enteroscopy on 4/29 which did not show any active bleeding.  Portal hypertensive gastropathy was noted.   ?Patient underwent capsule endoscopy.  No active bleeding was noted.  Discussed with GI.  Plan will be to observe him off of Plavix. ?Hemoglobin to be monitored closely.  If he has recurrent bleeding then octreotide Depo injection to be considered. ?  ?Upper back pain ?Developed back pain about 2 days prior to admission.  Worse with movement.  Denies any falls or injury.  Appears to be musculoskeletal. ?Previous imaging studies reviewed.  He had a thoracic spine MRI in 2022  which revealed a moderate size right paracentral disc extrusion at T6-T7 which was thought to be chronic.  Spinal stenosis was also noted.  Other disc bulging were also noted in T9-T10 through T12-L1.  Degenerative changes are also noted. ?Patient does not have any neurological deficits.  No chest pain.   ?Patient was given pain medications with some improvement.  Thoracic films did show degenerative changes but views were limited.   ?Since he continues to have significant pain MRI was ordered.  Was done this morning.  Abnormal findings noted.  Discussed with Dr. Zada Finders with neurosurgery.  He evaluated patient.  1 dose of steroid was recommended.  Will like to avoid sending him out on oral steroids considering his recent GI bleed.  No neurological deficits noted again.  No indication for neurosurgical intervention.  Discussed with patient.  He is okay with this plan.   ? ?End Stage renal disease on  hemodialysis ?Dialyzed Monday Wednesday Friday.   ?  ?Recent cellulitis of left lower extremity/status post left BKA previously ?Was discharged on Augmentin and doxycycline.   CRP 1.3.  WBC is normal.  No evidence for infection in his left lower extremity stump.  Patient was not continued on his antibiotics.  No indication to continue them at discharge either. ?  ?Essential hypertension ?Continue home medication ? ?Chronic hepatitis C/mild transaminitis ?Hepatitis C virus quantitative noted to be 9.88 million on 4/12.  Outpatient follow-up with GI.  Noted to be on ceftriaxone for SBP prophylaxis.  Can be discontinued at discharge. ?  ?Hepatocellular carcinoma ?Recently diagnosed via MRI.  Oncology is supposed to follow in the outpatient setting, [name]. ?  ?Lung mass ?Followed by Dr. Julien Nordmann and Dr. Valeta Harms.  Recent PET scan.  Outpatient management. ?  ?Peripheral artery disease ?S/p SFA stenting on 4/13.  He had been on aspirin and Plavix, but Plavix was stopped during hospitalization earlier this month. Vascular surgery had recommended to continue only on aspirin.  Patient apparently did not recall this. ?Should continue only aspirin at discharge. ?  ?Tobacco abuse ?Patient reports smoking half pack cigarettes per day on average.  He was counseled ?  ?Obesity ?Estimated body mass index is 34.65 kg/m? as calculated from the following: ?  Height as of this encounter: '5\' 11"'$  (1.803 m). ?  Weight as of this encounter: 112.7 kg. ?  ?  ?Patient stable.  Okay for discharge home today. ? ? ?PERTINENT LABS: ? ?The results of significant diagnostics from this hospitalization (including imaging, microbiology, ancillary and laboratory) are listed below for reference.   ? ?Microbiology: ?Recent Results (from the past 240 hour(s))  ?MRSA Next Gen by PCR, Nasal     Status: None  ? Collection Time: 08/17/21  8:08 PM  ? Specimen: Nasal Mucosa; Nasal Swab  ?Result Value Ref Range Status  ? MRSA by PCR Next Gen NOT DETECTED NOT  DETECTED Final  ?  Comment: (NOTE) ?The GeneXpert MRSA Assay (FDA approved for NASAL specimens only), ?is one component of a comprehensive MRSA colonization surveillance ?program. It is not intended to diagnose MRSA infection nor to guide ?or monitor treatment for MRSA infections. ?Test performance is not FDA approved in patients less than 2 years ?old. ?Performed at [facility], [place] [address], [place], [place] ?[address] ?  ?  ? ?Labs: ? ?COVID-19 Labs ? ? ? ?Lab Results  ?Component Value Date  ? [place] NEGATIVE 08/17/2021  ? [place] NEGATIVE 07/03/2021  ? [place] NEGATIVE 06/19/2021  ?  [place] NEGATIVE 06/13/2021  ? ? ? ? ?Basic Metabolic Panel: ?Recent Labs  ?Lab 08/23/21 ?8850 08/24/21 ?0301 08/25/21 ?0121 08/26/21 ?2774 08/27/21 ?0328  ?NA 140 134* 130* 130* 134*  ?K 4.3 3.8 4.0 4.5 3.7  ?CL 104 98 93* 91* 95*  ?CO2 '24 26 24 22 22  '$ ?GLUCOSE 109* 81 81 85 100*  ?BUN 35* 15 24* 33* 14  ?CREATININE 7.97* 4.34* 5.45* 7.10* 4.19*  ?CALCIUM 9.1 8.4* 8.7* 8.5* 8.2*  ?PHOS  --  3.1 4.8* 6.5* 3.2  ? ?Liver Function Tests: ?Recent Labs  ?Lab 08/23/21 ?1287 08/24/21 ?0301 08/25/21 ?0121 08/26/21 ?8676 08/27/21 ?0328  ?AST 62*  --   --   --   --   ?ALT 21  --   --   --   --   ?ALKPHOS 93  --   --   --   --   ?BILITOT 0.6  --   --   --   --   ?PROT 7.5  --   --   --   --   ?ALBUMIN 2.1* 2.1* 2.2* 2.3* 2.3*  ? ?CBC: ?Recent Labs  ?Lab 08/23/21 ?7209 08/23/21 ?1748 08/24/21 ?0424 08/26/21 ?4709 08/27/21 ?0328  ?WBC 5.3  --  5.6 5.8 5.5  ?HGB 7.4* 8.5* 8.1* 8.4* 8.3*  ?HCT 23.4* 25.4* 24.7* 25.3* 24.5*  ?MCV 101.7*  --  98.0 99.2 96.8  ?PLT 159  --  145* 153 148*  ? ? ?CBG: ?Recent Labs  ?Lab 08/24/21 ?0506 08/24/21 ?0732 08/24/21 ?6283  ?GLUCAP 78 82 106*  ? ? ? ?IMAGING STUDIES ?DG Thoracic Spine 2 View ? ?Result Date: 08/24/2021 ?CLINICAL DATA:  Pain in the thoracic spine without reported history of trauma. EXAM: THORACIC SPINE 2 VIEWS COMPARISON:  Prior PET exam from July 23, 2021. FINDINGS: Spinal  alignment is maintained. Signs of degenerative changes throughout the thoracic spine. No visible fracture signs of static malalignment. Upper thoracic spine with limited assessment due to overlap on lateral projecti

## 2021-08-27 NOTE — TOC Transition Note (Signed)
Transition of Care (TOC) - CM/SW Discharge Note ? ? ?Patient Details  ?Name: Daniel [name] ?MRN: [id number] ?Date of Birth: 1960-08-31 ? ?Transition of Care (TOC) CM/SW Contact:  ?Tom-Johnson, Renea Ee, RN ?Phone Number: ?08/27/2021, 4:46 PM ? ? ?Clinical Narrative:    ? ?Patient is scheduled for discharge today. Active with PT/OT disciplines with [facility] and to resume care at discharge. Cab voucher given to RN for transportation. No further TOC needs noted. ? ?Final next level of care: Home/Self Care ?Barriers to Discharge: Barriers Resolved ? ? ?Patient Goals and CMS Choice ?Patient states their goals for this hospitalization and ongoing recovery are:: To return home ?CMS Medicare.gov Compare Post Acute Care list provided to:: Patient ?Choice offered to / list presented to : NA ? ?Discharge Placement ?  ?           ?  ?Patient to be transferred to facility by: [facility] ?  ?  ? ?Discharge Plan and Services ?  ?  ?           ?DME Arranged: N/A ?DME Agency: NA ?  ?  ?  ?  ?  ?  ?  ?  ? ?Social Determinants of Health (SDOH) Interventions ?  ? ? ?Readmission Risk Interventions ? ?  02/27/2021  ? 12:51 PM 07/07/2019  ?  4:03 PM  ?Readmission Risk Prevention Plan  ?Transportation Screening Complete Complete  ?PCP or Specialist Appt within 3-5 Days  Complete  ?[place] or Home Care Consult  Complete  ?Social Work Consult for [place] Planning/Counseling  Complete  ?Palliative Care Screening  Not Applicable  ?Medication Review Press photographer) Complete Complete  ?PCP or Specialist appointment within 3-5 days of discharge Complete   ?[place] or Home Care Consult Complete   ?SW Recovery Care/Counseling Consult Complete   ?Palliative Care Screening Not Applicable   ?[place] Not Applicable   ? ? ? ? ? ?

## 2021-08-27 NOTE — Consult Note (Signed)
Neurosurgery Consultation ? ?Reason for Consult: Left buttock / low back pain ?Referring Physician: [place] Pink ? ?CC: Left buttock / low back pain ? ?HPI: This is a 61 y.o. man that presents with 1 week of left buttock / low back pain to the left of midline. He reports it's been present for 1 week, hasn't had Sx like this before, is non-ambulatory at baseline but it is worse with transfers and sitting in his wheelchair. He is admitted for c/f GI hemorrhage in the setting of a very significant PMHx of prior BKA / HCV with possible cirrhosis and/or HCC, CHF, ESRD on HD. No new weakness, numbness (baseline R stocking numbness that's unchanged), or parasthesias, no recent change in bowel or bladder function, no right sided symptoms, no radiation down the leg or associated paresthesias. No known inciting event. ? ? ?ROS: A 14 point ROS was performed and is negative except as noted in the HPI.  ? ?PMHx:  ?Past Medical History:  ?Diagnosis Date  ? Anemia   ? Diabetes mellitus without complication ([place])   ? patient denies  ? Dialysis patient [facility])   ? End stage chronic kidney disease ([place])   ? Hypertension   ? ICH (intracerebral hemorrhage) ([place]) 05/20/2017  ? PAD (peripheral artery disease) ([place])   ? Shoulder pain, left 06/28/2013  ? ?FamHx:  ?Family History  ?Problem Relation Age of Onset  ? Colon cancer Neg Hx   ? ?SocHx:  reports that he has been smoking cigarettes. He has a 22.50 pack-year smoking history. He has never used smokeless tobacco. He reports that he does not currently use alcohol. He reports that he does not currently use drugs after having used the following drugs: "Crack" cocaine. ? ?Exam: ?Vital signs in last 24 hours: ?Temp:  [97.7 ?F (36.5 ?C)-98.8 ?F (37.1 ?C)] 98.8 ?F (37.1 ?C) (05/02 0739) ?Pulse Rate:  [87-96] 95 (05/02 0739) ?Resp:  [15-17] 16 (05/02 0739) ?BP: (140-165)/(35-92) 165/92 (05/02 0739) ?SpO2:  [96 %-98 %] 97 % (05/02 0739) ?Weight:  [854 kg-112.7 kg] 112.7 kg (05/02 0519) ?General:  Awake, alert, cooperative, lying in bed in NAD ?Head: Normocephalic and atruamatic ?HEENT: Neck supple ?Pulmonary: breathing room air comfortably, no evidence of increased work of breathing ?Cardiac: RRR ?Abdomen: obese and mildly distended but S NT ND ?Extremities: Warm and well perfused, missing 2nd digit L hand and has L BKA ?Neuro/MSK: Strength 5/5 x4 except for distal LLE amp, SILTx4 except for R stocking numbness up tot he ankle, no hoffman's, no clonus on R, reflexes 1+ x4 ?No pain w/ int/ext rotation L hip, no TTP over greater trochanter, neg Gaenslen's, but some TTP over the L SI joint that extends up into the L paraspinal musculature ? ?Assessment and Plan: 61 y.o. man w/ complex PMHx as above with 1 week severe . MRI T-spine personally reviewed, which shows degen changes some areas of mild to moderate foraminal stenosis, canal is most notable for T6-7 central HNP with some moderate canal stenosis - CSF is visible behind the cord but possible subtle T2 signal change.   ? ?-no acute neurosurgical intervention indicated at this time ?-discussed with the patient, unsure what his diagnosis is. He localized pain a lot lower for me than reported previously, could be sacroiliitis or some acute facet arthritis in the L-spine or T-spine, hip exam neg for me, would recommend trial of some glucocorticoids, discussed w/ the pt that he has multiple co-morbidities that introduce some risk with even this mild intervention but he's  miserable and would like to proceed ?-please call with any concerns or questions ? ?Judith Part, MD ?08/27/21 ?3:18 PM ?[facility] ? ?

## 2021-08-28 ENCOUNTER — Telehealth: Payer: Self-pay

## 2021-08-28 ENCOUNTER — Other Ambulatory Visit: Payer: Self-pay | Admitting: *Deleted

## 2021-08-28 NOTE — Telephone Encounter (Signed)
Transition Care Management Follow-up Telephone Call ?Date of discharge and from where: 09/03/2021 [facility]  ?How have you been since you were released from the hospital? He said he is ok currently in dialysis ?Any questions or concerns? no ?Items Reviewed: ?Did the pt receive and understand the discharge instructions provided? Yes  ?Medications obtained and verified?  stated he has all his meds ?Other? No  ?Any new allergies since your discharge? No  ?Dietary orders reviewed? No ?Do you have support at home?  Lives in a rooming house.  ?  ?Home Care and Equipment/Supplies: ?Were home health services ordered? No new order for home health when he was discharged.  ?If so, what is the name of the agency? N/a Daniel [name] was the agency he was referred to prior to his hospitalization.  ?Has the agency set up a time to come to the patient's home? not applicable ?Were any new equipment or medical supplies ordered?  No ?What is the name of the medical supply agency? N/a ?Were you able to get the supplies/equipment? not applicable ?Do you have any questions related to the use of the equipment or supplies? No ?  ?Pt stated he is still currently using a wheelchair from his landlord and that he did not call [place] ?  ?He attends HD: M/W/F at [facility]  ?  ?Functional Questionnaire: (I = Independent and D = Dependent) ?ADLs: He primarily uses the wheelchair for mobility. He also has a RW. Independent with personal care. ?  ?Follow up appointments reviewed: ?  ?PCP Hospital f/u appt confirmed? Yes  Scheduled to see Daniel Mire, NP  - 09/03/2021.  ?[facility] f/u appt confirmed? Yes  - he had an appointment with [facility] today. Oncology - 08/30/2021, pulmonary - [id number] ?Are transportation arrangements needed? No , he said he is driving.  ?If their condition worsens, is the pt aware to call PCP or go to the Emergency Dept.? Yes ?Was the patient provided with contact information for the PCP's office or ED?  Yes ?Was to pt encouraged to call back with questions or concerns? Yes ?  ?

## 2021-08-29 ENCOUNTER — Other Ambulatory Visit (INDEPENDENT_AMBULATORY_CARE_PROVIDER_SITE_OTHER): Payer: Self-pay | Admitting: Primary Care

## 2021-08-29 ENCOUNTER — Other Ambulatory Visit: Payer: Self-pay

## 2021-08-29 ENCOUNTER — Telehealth: Payer: Self-pay

## 2021-08-29 ENCOUNTER — Encounter: Payer: Self-pay | Admitting: *Deleted

## 2021-08-29 ENCOUNTER — Ambulatory Visit (INDEPENDENT_AMBULATORY_CARE_PROVIDER_SITE_OTHER): Payer: Self-pay | Admitting: *Deleted

## 2021-08-29 ENCOUNTER — Telehealth (INDEPENDENT_AMBULATORY_CARE_PROVIDER_SITE_OTHER): Payer: Self-pay | Admitting: Primary Care

## 2021-08-29 ENCOUNTER — Other Ambulatory Visit: Payer: Self-pay | Admitting: *Deleted

## 2021-08-29 DIAGNOSIS — C220 Liver cell carcinoma: Secondary | ICD-10-CM

## 2021-08-29 NOTE — Patient Outreach (Signed)
[facility]) Care Management ?Geriatric Nurse Practitioner Note ? ? ?08/29/2021 ?Name:  Daniel [name] MRN:  [id number] DOB:  [birth year] ? ?Summary: ?New pt, post hospitalization for GI Bleeding, acute on chronic anemia, erosive esophagitis, cirrhosis, hepatocellular carcinoma (new dx) ? ?Recommendations/Changes made from today's visit: ?See oncology tomorrow and particpate in telephone calls with [facility] NP weekly. ? ? ?Subjective: ?Daniel [name] is an 61 y.o. year old male who is a primary patient of [name], NP. The care management team was consulted for assistance with care management and/or care coordination needs.   ? ?Pt is back at his boarding home which is not equipped for a person in a W/C. His landlord has given him notice he needs to leave as soon as he can find appropriate housing. [name] CSW had mailed resources for the pt to review and take initiative to find new housing (NP confirmed with Ezequiel Ganser, Rehab specialist at [facility]. Their nurse. Wyline Beady, RN and PT/OT have established with pt). Pt uses W/C and a [employer] prosthetic is pending. Pt has a car and can drive himself. ? ?Pt has new dx of hepatocellular carcinoma and will be evaluated tomorrow in oncology. ? ?Geriatric Nurse Practitioner completed Telephone Visit today.  ? ?Patient was recently discharged from hospital and all medications have been reviewed. ?Outpatient Encounter Medications as of 08/29/2021  ?Medication Sig Note  ? amLODipine (NORVASC) 2.5 MG tablet Take 1 tablet (2.5 mg total) by mouth at bedtime. (Patient taking differently: Take 2.5 mg by mouth daily.)   ? aspirin 81 MG EC tablet Take 1 tablet (81 mg total) by mouth daily. Swallow whole.   ? atorvastatin (LIPITOR) 40 MG tablet Take 1 tablet (40 mg total) by mouth daily. 08/23/2021: LF 08/10/21  ? calcium acetate, Phos Binder, (PHOSLYRA) 667 MG/5ML SOLN Take 667 mg by mouth 3 (three) times daily with meals.   ? Darbepoetin Alfa (ARANESP) 100  MCG/0.5ML SOSY injection Inject 0.5 mLs (100 mcg total) into the vein every 7 (seven) days for 8 doses. 08/23/2021: Pt does not know anything about this medication  ? gabapentin (NEURONTIN) 100 MG capsule Take 1 capsule (100 mg total) by mouth 3 (three) times daily.   ? HYDROcodone-acetaminophen (NORCO/VICODIN) 5-325 MG tablet Take 1 tablet by mouth every 4 (four) hours as needed for severe pain.   ? methocarbamol (ROBAXIN) 500 MG tablet Take 1 tablet (500 mg total) by mouth every 6 (six) hours as needed for muscle spasms.   ? pantoprazole (PROTONIX) 40 MG tablet Take 1 tablet (40 mg total) by mouth 2 (two) times daily before a meal.   ? [DISCONTINUED] blood glucose meter kit and supplies KIT Dispense based on patient and insurance preference. Use up to four times daily as directed. (FOR ICD-9 250.00, 250.01).   ? [DISCONTINUED] colchicine 0.6 MG tablet Take 0.5 tablets (0.3 mg total) by mouth 2 (two) times daily.   ? [DISCONTINUED] furosemide (LASIX) 40 MG tablet Take 1 tablet (40 mg total) by mouth daily.   ? ?No facility-administered encounter medications on file as of 08/29/2021.  ? ?SDOH:  (Social Determinants of Health) assessments and interventions performed:  ?SDOH Interventions   ? ?Flowsheet Row Most Recent Value  ?SDOH Interventions   ?Food Insecurity Interventions Intervention Not Indicated  ?Housing Interventions Other (Comment)  [Patient is in a rooming house that he cannot get around in, in his w/c it is evidently not handicapped equiped.]  ?Transportation Interventions Intervention Not Indicated  ? ?  ? ?  Care Plan ? ?Review of patient past medical history, allergies, medications, health status, including review of consultants reports, laboratory and other test data, was performed as part of comprehensive evaluation for care management services.  ? ?Care Plan : [facility] NP Plan of Care  ?Updates made by Deloria Lair, NP since 08/29/2021 12:00 AM  ?  ? ?Problem: Needs handicapped accessible living quarters.    ?Priority: High  ?Onset Date: 08/29/2021  ?  ? ?Goal: Patient will review and fill out applications for appropriate housing over the next 30 days.   ?Start Date: 08/29/2021  ?Expected End Date: 10/01/2021  ?Priority: High  ?Note:   ?Current Barriers:  ?Self motivation ? ?RNCM Clinical Goal(s):  ?Patient will work with care team [facility] NP, [place] CSW, potential housing locations) to choose appropriate housing and complete applications as evidenced by pt report   through collaboration with Consulting civil engineer, provider, and care team.  ? ?Interventions: ?Inter-disciplinary care team collaboration (see longitudinal plan of care) ?Evaluation of current treatment plan related to  self management and patient's adherence to plan as established by provider ? ?Patient Goals/Self-Care Activities: ?Initiate process of finding new housing with resources given. ? ? ?  ? ?Problem: Possible new dx of liver and lung cancer   ?Priority: Medium  ?Onset Date: 08/29/2021  ?  ? ?Goal: Patient will attend oncology appts over the next 30 days.   ?Start Date: 08/29/2021  ?Expected End Date: 09/30/2021  ?Priority: Medium  ?Note:   ?Current Barriers:  ?Knowledge Deficits related to plan of care for management of possible new ca.  ?Chronic Disease Management support and education needs related to possible ca ?No Advanced Directives in place ? ?RNCM Clinical Goal(s):  ?Patient will verbalize understanding of plan for management of possible ca as evidenced by pt report. ?attend all scheduled medical appointments: Oncology as evidenced by chart review        through collaboration with RN Care manager, provider, and care team.  ? ?Interventions: ?Inter-disciplinary care team collaboration (see longitudinal plan of care) ?Evaluation of current treatment plan related to  self management and patient's adherence to plan as established by provider ? ?Patient Goals/Self-Care Activities: ?Attend all scheduled provider appointments ? ? ?  ?  ? ?Plan: Telephone follow up  appointment with care management team member scheduled for:  May 11th. Pt agrees to plan of care. ? ?[name] Demondre Aguas, MSN, GNP-BC ?Gerontological Nurse Practitioner ?[facility] Care Management ?[phone] ? ? ? ? ? ?

## 2021-08-29 NOTE — Telephone Encounter (Signed)
LVM stating that lab appt was cancelled for 08/30/2021.  Informed pt that he has a new pt appt with [name] on 08/30/2021 at 11:20am.  ?

## 2021-08-29 NOTE — Telephone Encounter (Signed)
Copied from [place] [phone]. Topic: Quick Communication - Home Health Verbal Orders ?>> Aug 29, 2021 10:28 AM Tessa Lerner A wrote: ?Caller/Agency: [facility]  ?Callback Number: [phone] ?Requesting OT/PT/Skilled Nursing/Social Work/Speech Therapy: Skilled Nursing  ?Frequency: 1w3 ?

## 2021-08-29 NOTE — Telephone Encounter (Signed)
Also requesting verbal orders.  ?

## 2021-08-29 NOTE — Telephone Encounter (Signed)
?  Summary: rx request / blood sugar / BP concern  ? Reason for CRM: Glenard Haring with [name] has called to request a prescription for a blood glucometer for the patient  ? ?Glenard Haring would like the meter and supplies sent to  ?[place], [place]  ?[place] [address]  ?Phone: [phone] Fax: [phone]  ?Hours: Not open 24 hours  ? ?The patient was directed to begin checking their blood sugar after their recent hospitalization  ? ?The patient's BP was 168/94 taken 10:15 approx 08/29/21  ? ?Please contact the patient or their home health provider further when possible  ? ?----- Message from Marlis Edelson sent at 08/29/2021 10:28 AM EDT -----  ?Reason for CRM: Glenard Haring with [name] has called to request a prescription for a blood glucometer for the patient  ? ?Glenard Haring would like the meter and supplies sent to  ?[place], [place]  ?[place] [address]  ?Phone: [phone] Fax: [phone]  ?Hours: Not open 24 hours   ?  ? ? ? ?Chief Complaint: elevated BP per [facility] nurse and requesting glucometer kit  ?Symptoms: BP 168/94 at 10:15 this am  ?Frequency: na ?Pertinent Negatives: Patient denies headache, blurred vision, no weakness on either side or body per patient  ?Disposition: _0 ED /_1 Urgent Care (no appt availability in office) / _2 Appointment(In office/virtual)/ _3  [place] Virtual [facility]/ _4 Home Care/ _5 Refused Recommended Disposition /_6 [place] Mobile [facility]/ _7  Follow-up with PCP ?Additional Notes:  ? ?Called patient to triage due to elevated BP. Patient denies any symptoms and does not have a BP monitor to check BP. Patient reports he just took BP meds prior to [place] checking BP. Appt already scheduled for 09/03/21 recommended if sx occur call back or go to UC/ED over weekend if needed. Patient verbalized understanding .   ? ? ? ?Reason for Disposition ?  Systolic BP  >= 174 OR Diastolic >= 944 ? ?Answer Assessment - Initial Assessment Questions ?1. BLOOD PRESSURE: "What is the blood pressure?" "Did you take at least two measurements 5 minutes apart?" ?    BP checked at 10:15 am today for 168/94 ?2. ONSET: "When did you take your blood pressure?" ?    [facility] nurse checked BP ?3. HOW: "How did you obtain the blood pressure?" (e.g., visiting nurse, automatic home BP monitor) ?    Visiting nurse  ?4. HISTORY: "Do you have a history of high blood pressure?" ?    yes ?5. MEDICATIONS: "Are you taking any medications for blood pressure?" "Have you missed any doses recently?" ?    Took BP meds just prior to [place] checking per patient  ?6. OTHER SYMPTOMS: "Do you have any symptoms?" (e.g., headache, chest pain, blurred vision, difficulty breathing, weakness) ?    Denies  ?7. PREGNANCY: "Is there any chance you are pregnant?" "When was your last menstrual period?" ?    na ? ?Protocols used: Blood Pressure - High-A-AH ? ?

## 2021-08-29 NOTE — Telephone Encounter (Signed)
Verbals provided to [facility] by PCP ?

## 2021-08-30 ENCOUNTER — Encounter: Payer: Self-pay | Admitting: Hematology

## 2021-08-30 ENCOUNTER — Other Ambulatory Visit: Payer: Self-pay

## 2021-08-30 ENCOUNTER — Telehealth: Payer: Self-pay

## 2021-08-30 ENCOUNTER — Inpatient Hospital Stay: Payer: Medicare Other

## 2021-08-30 ENCOUNTER — Inpatient Hospital Stay: Payer: Medicare Other | Attending: Internal Medicine | Admitting: Hematology

## 2021-08-30 VITALS — BP 167/87 | HR 90 | Temp 98.1°F | Resp 17

## 2021-08-30 DIAGNOSIS — D631 Anemia in chronic kidney disease: Secondary | ICD-10-CM

## 2021-08-30 DIAGNOSIS — C221 Intrahepatic bile duct carcinoma: Secondary | ICD-10-CM | POA: Diagnosis present

## 2021-08-30 DIAGNOSIS — D500 Iron deficiency anemia secondary to blood loss (chronic): Secondary | ICD-10-CM | POA: Diagnosis not present

## 2021-08-30 DIAGNOSIS — D509 Iron deficiency anemia, unspecified: Secondary | ICD-10-CM | POA: Diagnosis not present

## 2021-08-30 DIAGNOSIS — K31819 Angiodysplasia of stomach and duodenum without bleeding: Secondary | ICD-10-CM

## 2021-08-30 DIAGNOSIS — N186 End stage renal disease: Secondary | ICD-10-CM | POA: Insufficient documentation

## 2021-08-30 DIAGNOSIS — K922 Gastrointestinal hemorrhage, unspecified: Secondary | ICD-10-CM | POA: Diagnosis not present

## 2021-08-30 DIAGNOSIS — C220 Liver cell carcinoma: Secondary | ICD-10-CM

## 2021-08-30 DIAGNOSIS — Z992 Dependence on renal dialysis: Secondary | ICD-10-CM | POA: Insufficient documentation

## 2021-08-30 DIAGNOSIS — Z5112 Encounter for antineoplastic immunotherapy: Secondary | ICD-10-CM | POA: Insufficient documentation

## 2021-08-30 DIAGNOSIS — K921 Melena: Secondary | ICD-10-CM

## 2021-08-30 LAB — COMPREHENSIVE METABOLIC PANEL
ALT: 20 U/L (ref 0–44)
AST: 61 U/L — ABNORMAL HIGH (ref 15–41)
Albumin: 3.1 g/dL — ABNORMAL LOW (ref 3.5–5.0)
Alkaline Phosphatase: 102 U/L (ref 38–126)
Anion gap: 9 (ref 5–15)
BUN: 25 mg/dL — ABNORMAL HIGH (ref 8–23)
CO2: 29 mmol/L (ref 22–32)
Calcium: 8.8 mg/dL — ABNORMAL LOW (ref 8.9–10.3)
Chloride: 104 mmol/L (ref 98–111)
Creatinine, Ser: 6 mg/dL (ref 0.61–1.24)
GFR, Estimated: 10 mL/min — ABNORMAL LOW (ref >60)
Glucose, Bld: 111 mg/dL — ABNORMAL HIGH (ref 70–99)
Potassium: 3.6 mmol/L (ref 3.5–5.1)
Sodium: 142 mmol/L (ref 135–145)
Total Bilirubin: 0.5 mg/dL (ref 0.3–1.2)
Total Protein: 8.1 g/dL (ref 6.5–8.1)

## 2021-08-30 LAB — CBC WITH DIFFERENTIAL/PLATELET
Abs Immature Granulocytes: 0.01 10*3/uL (ref 0.00–0.07)
Basophils Absolute: 0 10*3/uL (ref 0.0–0.1)
Basophils Relative: 0 %
Eosinophils Absolute: 0.1 10*3/uL (ref 0.0–0.5)
Eosinophils Relative: 1 %
HCT: 22.7 % — ABNORMAL LOW (ref 39.0–52.0)
Hemoglobin: 7.1 g/dL — ABNORMAL LOW (ref 13.0–17.0)
Immature Granulocytes: 0 %
Lymphocytes Relative: 25 %
Lymphs Abs: 1.1 10*3/uL (ref 0.7–4.0)
MCH: 32.3 pg (ref 26.0–34.0)
MCHC: 31.3 g/dL (ref 30.0–36.0)
MCV: 103.2 fL — ABNORMAL HIGH (ref 80.0–100.0)
Monocytes Absolute: 0.6 10*3/uL (ref 0.1–1.0)
Monocytes Relative: 13 %
Neutro Abs: 2.7 10*3/uL (ref 1.7–7.7)
Neutrophils Relative %: 61 %
Platelets: 162 10*3/uL (ref 150–400)
RBC: 2.2 MIL/uL — ABNORMAL LOW (ref 4.22–5.81)
RDW: 20 % — ABNORMAL HIGH (ref 11.5–15.5)
WBC: 4.5 10*3/uL (ref 4.0–10.5)
nRBC: 0 % (ref 0.0–0.2)

## 2021-08-30 LAB — FERRITIN: Ferritin: 82 ng/mL (ref 24–336)

## 2021-08-30 NOTE — Progress Notes (Signed)
?[place]   ?[phone] [phone]   ?Clinic Follow up Note  ? ?Patient Care Team: ?[name], NP as PCP - General (Internal Medicine) ?[facility], [place] ?Deloria Lair, NP as [place] Management ? ?Date of Service:  08/30/2021 ? ?CHIEF COMPLAINT: f/u of [place] and anemia  ? ?CURRENT THERAPY:  ?PENDING Y90 ? ?ASSESSMENT & PLAN:  ?Daniel [name] is a 61 y.o. male with  ? ?1. Hepatocellular Carcinoma, cT2N0M0, stage II ?-non-hypermetabolic liver lesion incidentally seen on PET on 07/23/21 for lung nodule. No metabolic adenopathy. ?-presented to ED on 08/05/21 with recurrent tarry black stool and severe anemia, subsequently admitted. CT angio AP showed a large 5CM central liver lesion, further evaluated with MRI on 08/07/21 showing: 5 cm mass centered in segment 4A; adjacent 2 cm mass in right hepatic lobe (indeterminate); no adenopathy or distant metastasis.  The imaging finding was consistent with [place], liver biopsy not felt to be necessary for diagnosis of [place]. ?-baseline AFP on 08/07/21 was elevated at 13.9. ?-I had a long conversation with with patient and his niece about treatment options.we have reviewed his case in our GI conference, surgical resection was felt to be difficult, I discussed the option of referring him to a tertiary cancer center for surgical consult, or we can offer liver-targeted therapy such as Y90, or systemic therapy such as Tecentriq and bevacizumab.  We discussed that surgical resection is the only curative treatment.  After lengthy discussion, patient declined surgical referral, and is open to liver targeted therapy.  I recommend proceeding with liver-targeted therapy. I will refer him to IR. ?-Due to his multiple medical comorbidities, will hold on systemic treatment for now. ? ?2. Severe Anemia from GI Bleed and Iron Deficiency ?-anemia present since 2020, in part related to hemodialysis. ?-has required  numerous hospital admission and blood transfusions. ?-last CBC in the hospital on 08/27/21 showed hgb 8.3. ?-he continues to have black stool; I will send him for updated labs today. ?-I may consider bevacizumab for his AVM related GI bleeding, will discuss with GI. ? ?3. Liver Cirrhosis from untreated hepatitis C ?-history of alcohol abuse, he has cut down now  ?-chart review shows initial HCV positive in 05/2017, Hep B positive in 07/2019. ? ?4. End-stage Renal Disease ?-on hemodialysis M/W/F ? ? ?PLAN: ?-lab today and weekly x8 ?-IV Feraheme next week (one dose) and as needed in future  ?-referral to IR to discuss liver targeted therapy for his [place] ?-f/u in 6 weeks ? ? ? ?No problem-specific Assessment & Plan notes found for this encounter. ? ? ?SUMMARY OF ONCOLOGIC HISTORY: ?Oncology History  ?Hepatocellular carcinoma ([place])  ?02/07/2021 Imaging  ? CLINICAL DATA:  Abdominal distension ?  ?EXAM: ?CT ABDOMEN AND PELVIS WITHOUT CONTRAST ? ?IMPRESSION: ?Cholelithiasis with possible mild gallbladder wall thickening. ?Correlate for symptoms of right upper quadrant pain and consider ?further evaluation with gallbladder ultrasound. ?  ?Otherwise, no acute findings in the abdomen or pelvis. ?  ?Aortic aneurysm NOS (ICD10-I71.9). ?  ?02/07/2021 Imaging  ? CLINICAL DATA:  Cholelithiasis. Question cholecystitis. Abdominal ?distension. ?  ?EXAM: ?ULTRASOUND ABDOMEN LIMITED RIGHT UPPER QUADRANT ? ?IMPRESSION: ?1. Cholelithiasis with associated nonspecific gallbladder wall ?thickening. No pericholecystic fluid or sonographic Percell Miller sign ?reported. Gallbladder wall thickening can be seen in the setting of ?chronic liver disease. Correlate with clinical exam for acute ?cholecystitis. ?2. Limited ultrasound to penetration of sonographic waves. ?  ?03/13/2021 Imaging  ? CLINICAL DATA:  61 year old male with  generalized abdominal pain and bright red blood in stool x3 days. ?  ?EXAM: ?CT ABDOMEN AND PELVIS WITHOUT  CONTRAST ? ?IMPRESSION: ?1. Chronic cholelithiasis, with indistinct appearance of the ?gallbladder wall similar to the CT last month. However, relatively ?contracted state of the gallbladder argues against Acute ?Cholecystitis. But if there is right upper quadrant pain recommend ?Right Upper Quadrant Ultrasound may be valuable. ?  ?2. No other acute or inflammatory process identified in the ?non-contrast abdomen or pelvis. ?  ?3. Diffuse calcified atherosclerosis, Aortic atherosclerosis ?(ICD10-I70.0). Left nephrolithiasis. Mild large bowel ?diverticulosis. ?  ?07/23/2021 PET scan  ? IMPRESSION: ?1. The ground-glass nodule in the RIGHT lower lobe is not well ?demonstrated on the nondiagnostic CT portion the PET-CT scan. No hypermetabolic lesion present on the PET data set within the RIGHT lower lobe. ?2. Bands of intensely hypermetabolic peripheral atelectasis in the ?medial RIGHT lower lobe and lateral LEFT lower lobe/lingula. Favor benign benign atelectasis or other inflammatory process. Recommend follow-up CT (6 - 8 weeks) to demonstrate resolution. If pattern persists or increased tissue sampling may be warranted. ?3. No metastatic adenopathy or distant metastatic disease. ?4. Incidental findings of large central non hypermetabolic lesion in ?the liver. Recommend MRI of the liver with contrast for further ?characterization. ?  ?08/05/2021 Imaging  ? CLINICAL DATA:  GI bleed, melena, history of duodenal ulcer, anemia and end-stage renal disease. ?  ?EXAM: ?CT ANGIOGRAPHY ABDOMEN AND PELVIS WITH CONTRAST AND WITHOUT CONTRAST ? ?IMPRESSION: ?1. No active bleeding is identified into the gastrointestinal tract ?during arterial and venous phases of CT imaging. ?2. Irregular outpouching along the medial aspect of the proximal ?duodenum near the expected position of the duodenal bulb or post ?bulbar duodenum. This is highly suspicious for a focal duodenal ?ulcer. There is some adjacent edema in the retroperitoneal fat.  This ?may be the source of GI bleeding clinically. ?3. Detection of a rounded 4.4 cm central hepatic mass near the ?juncture of segments IVb and VIII as well as a possible adjacent 2.2 cm mass in segment VIII. Main differential considerations are some type of well differentiated carcinoma such as hepatocellular ?carcinoma or neuroendocrine carcinoma. Metastatic disease or ?cholangiocarcinoma is not excluded. Further evaluation with MRI of ?the abdomen with and without contrast is warranted. ?  ?08/06/2021 Initial Diagnosis  ? Hepatocellular carcinoma ([place]) ?  ?08/07/2021 Imaging  ? CLINICAL DATA:  Liver mass, history of hepatitis-C ?  ?EXAM: ?MRI ABDOMEN WITHOUT AND WITH CONTRAST (INCLUDING MRCP) ? ?IMPRESSION: ?1. Hepatic cirrhosis. 5 cm mass centered in segment 4A is LI-RADS 5, consistent with hepatocellular carcinoma. ?2. Adjacent 2 cm mass in the central right hepatic lobe as ?described, LI-RADS 3. Consider follow-up in 3-6 months. ?3. Mild splenomegaly likely secondary to portal hypertension. ?4. Trace ascites. ?5. Small bilateral pleural effusions. Abdominal wall subcutaneous ?edema noted. ?  ? ? ? ?INTERVAL HISTORY:  ?Daniel [name] is here for a follow up of [place]. He was last seen by me on 08/08/21 while in the hospital. He presents to the clinic accompanied by his niece. ?He reports continued diarrhea, 4-5x a night. He endorses black, watery stool. ?  ?All other systems were reviewed with the patient and are negative. ? ?MEDICAL HISTORY:  ?Past Medical History:  ?Diagnosis Date  ? Anemia   ? Diabetes mellitus without complication ([place])   ? patient denies  ? Dialysis patient [facility])   ? End stage chronic kidney disease ([place])   ? Hypertension   ? ICH (intracerebral hemorrhage) ([place]) 05/20/2017  ?  PAD (peripheral artery disease) ([place])   ? Shoulder pain, left 06/28/2013  ? ? ?SURGICAL HISTORY: ?Past Surgical History:  ?Procedure Laterality Date  ? A/V FISTULAGRAM N/A 08/15/2020  ? Procedure: A/V FISTULAGRAM - Left  Upper;  Surgeon: Cherre Robins, MD;  Location: [facility];  Service: Cardiovascular;  Laterality: N/A;  ? ABDOMINAL AORTOGRAM W/LOWER EXTREMITY N/A 04/08/2021  ? Procedure: ABDOMINAL AORTOGRAM W/LOWER EXTREMITY;  Renford Dills

## 2021-08-30 NOTE — Telephone Encounter (Signed)
Critical lab value Scr+ 6.0 today.  Pt is a dialysis pt.  Notified [name] of critical lab. ?

## 2021-08-31 ENCOUNTER — Encounter: Payer: Self-pay | Admitting: Hematology

## 2021-08-31 LAB — AFP TUMOR MARKER: AFP, Serum, Tumor Marker: 10.5 ng/mL — ABNORMAL HIGH (ref 0.0–8.4)

## 2021-08-31 NOTE — Progress Notes (Signed)
START OFF PATHWAY REGIMEN - Other ? ? ?[id number]:Bevacizumab 10 mg/kg IV D1 q14 Days: ?  A cycle is every 14 days: ?    Bevacizumab-xxxx  ? ?**Always confirm dose/schedule in your pharmacy ordering system** ? ?Patient Characteristics: ?Intent of Therapy: ?Non-Curative / Palliative Intent, Discussed with Patient ?

## 2021-08-31 NOTE — Addendum Note (Signed)
Addended by: Truitt Merle on: 08/31/2021 11:23 AM ? ? Modules accepted: Orders ? ?

## 2021-09-02 ENCOUNTER — Other Ambulatory Visit: Payer: Self-pay

## 2021-09-02 ENCOUNTER — Telehealth: Payer: Self-pay

## 2021-09-02 ENCOUNTER — Other Ambulatory Visit: Payer: Self-pay | Admitting: Hematology

## 2021-09-02 DIAGNOSIS — D649 Anemia, unspecified: Secondary | ICD-10-CM

## 2021-09-02 DIAGNOSIS — C220 Liver cell carcinoma: Secondary | ICD-10-CM

## 2021-09-02 DIAGNOSIS — D631 Anemia in chronic kidney disease: Secondary | ICD-10-CM

## 2021-09-02 NOTE — Progress Notes (Signed)
Orders for T&S and Blood entered. ?

## 2021-09-02 NOTE — Telephone Encounter (Signed)
Spoke with home health nurse requesting a blood sugar meter per hospital discharge. No documentation patient is a diabetic or any recent A1C. When he has an appt will obtain A1C for dx and insurance approval if needed. She voiced understanding and agreed. She is having difficulty with him following direction and taking medications. ?

## 2021-09-02 NOTE — Telephone Encounter (Signed)
Spoke with pt's niece regarding appts on Tuesday 09/03/2021 & Thursday 09/05/2021 for Blood transfusion, IV iron, and Bevacizumab.  Pt's niece confirmed appts and stated she will make sure her uncle is aware and here for his transfusions. ?

## 2021-09-02 NOTE — Progress Notes (Signed)
IR referral, last ov note, demographics and insurance information faxed to [facility] attn: Vicky at [phone]. ?

## 2021-09-03 ENCOUNTER — Inpatient Hospital Stay: Payer: Medicare Other

## 2021-09-03 ENCOUNTER — Other Ambulatory Visit: Payer: Self-pay | Admitting: Hematology

## 2021-09-03 ENCOUNTER — Ambulatory Visit (INDEPENDENT_AMBULATORY_CARE_PROVIDER_SITE_OTHER): Payer: Medicare Other | Admitting: Primary Care

## 2021-09-03 ENCOUNTER — Other Ambulatory Visit: Payer: Self-pay

## 2021-09-03 ENCOUNTER — Encounter (INDEPENDENT_AMBULATORY_CARE_PROVIDER_SITE_OTHER): Payer: Self-pay | Admitting: Primary Care

## 2021-09-03 ENCOUNTER — Ambulatory Visit (INDEPENDENT_AMBULATORY_CARE_PROVIDER_SITE_OTHER): Payer: Self-pay

## 2021-09-03 ENCOUNTER — Telehealth: Payer: Self-pay | Admitting: *Deleted

## 2021-09-03 VITALS — BP 162/78 | HR 91 | Temp 98.0°F | Ht 71.0 in

## 2021-09-03 DIAGNOSIS — Z09 Encounter for follow-up examination after completed treatment for conditions other than malignant neoplasm: Secondary | ICD-10-CM

## 2021-09-03 DIAGNOSIS — D649 Anemia, unspecified: Secondary | ICD-10-CM

## 2021-09-03 DIAGNOSIS — C220 Liver cell carcinoma: Secondary | ICD-10-CM

## 2021-09-03 DIAGNOSIS — F17210 Nicotine dependence, cigarettes, uncomplicated: Secondary | ICD-10-CM

## 2021-09-03 DIAGNOSIS — Z720 Tobacco use: Secondary | ICD-10-CM

## 2021-09-03 DIAGNOSIS — N186 End stage renal disease: Secondary | ICD-10-CM

## 2021-09-03 DIAGNOSIS — E118 Type 2 diabetes mellitus with unspecified complications: Secondary | ICD-10-CM | POA: Diagnosis not present

## 2021-09-03 DIAGNOSIS — Z992 Dependence on renal dialysis: Secondary | ICD-10-CM

## 2021-09-03 DIAGNOSIS — R531 Weakness: Secondary | ICD-10-CM | POA: Diagnosis not present

## 2021-09-03 DIAGNOSIS — F5102 Adjustment insomnia: Secondary | ICD-10-CM

## 2021-09-03 DIAGNOSIS — Z5112 Encounter for antineoplastic immunotherapy: Secondary | ICD-10-CM | POA: Diagnosis not present

## 2021-09-03 LAB — POCT GLYCOSYLATED HEMOGLOBIN (HGB A1C): Hemoglobin A1C: 5.2 % (ref 4.0–5.6)

## 2021-09-03 LAB — CBC WITH DIFFERENTIAL/PLATELET
Abs Immature Granulocytes: 0.02 10*3/uL (ref 0.00–0.07)
Basophils Absolute: 0 10*3/uL (ref 0.0–0.1)
Basophils Relative: 0 %
Eosinophils Absolute: 0.1 10*3/uL (ref 0.0–0.5)
Eosinophils Relative: 2 %
HCT: 19 % — ABNORMAL LOW (ref 39.0–52.0)
Hemoglobin: 6 g/dL — CL (ref 13.0–17.0)
Immature Granulocytes: 0 %
Lymphocytes Relative: 33 %
Lymphs Abs: 1.6 10*3/uL (ref 0.7–4.0)
MCH: 31.9 pg (ref 26.0–34.0)
MCHC: 31.6 g/dL (ref 30.0–36.0)
MCV: 101.1 fL — ABNORMAL HIGH (ref 80.0–100.0)
Monocytes Absolute: 0.7 10*3/uL (ref 0.1–1.0)
Monocytes Relative: 14 %
Neutro Abs: 2.4 10*3/uL (ref 1.7–7.7)
Neutrophils Relative %: 51 %
Platelets: 156 10*3/uL (ref 150–400)
RBC: 1.88 MIL/uL — ABNORMAL LOW (ref 4.22–5.81)
RDW: 19.5 % — ABNORMAL HIGH (ref 11.5–15.5)
WBC: 4.7 10*3/uL (ref 4.0–10.5)
nRBC: 0 % (ref 0.0–0.2)

## 2021-09-03 LAB — SAMPLE TO BLOOD BANK

## 2021-09-03 LAB — PREPARE RBC (CROSSMATCH)

## 2021-09-03 LAB — TOTAL PROTEIN, URINE DIPSTICK: Protein, ur: 100 mg/dL — AB

## 2021-09-03 LAB — FERRITIN: Ferritin: 74 ng/mL (ref 24–336)

## 2021-09-03 MED ORDER — TRAZODONE HCL 50 MG PO TABS: 25.0000 mg | ORAL_TABLET | Freq: Every evening | ORAL | 3 refills | Status: DC | PRN

## 2021-09-03 NOTE — Telephone Encounter (Signed)
Called Mr. Corpening to schedule consult for [facility]. Home phone is disc and cell ph box full/vm ?

## 2021-09-03 NOTE — Progress Notes (Signed)
Patient here for 1 unit of blood today. He did not realize that he would be here for 3 hours total time. The process of giving blood was explained. Patient stated that he could not wait that long today and refused to stay. Risks of leaving without blood transfusion with a hemoglobin of 6 was conveyed to him and niece. He knows to call or go to the ER if anything gets worse for him before his next appointment with [place] on 09/05/21. Explained importance of keeping blood bank bracelet on until next appointment as well.  ?
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
## 2021-09-03 NOTE — Telephone Encounter (Signed)
?  Chief Complaint: Bloody diarrhea ?Symptoms: Bloody diarrhea ?Frequency: Before April 24th - has not gotten better ?Pertinent Negatives: Patient denies  ?Disposition: '[x]'$ ED /'[]'$ Urgent Care (no appt availability in office) / '[]'$ Appointment(In office/virtual)/ '[]'$  [place] Virtual Care/ '[]'$ Home Care/ '[]'$ Refused Recommended Disposition /'[]'$ [place] Mobile Bus/ '[]'$  Follow-up with PCP ?Additional Notes: Received call from [place] from [place] regarding pt having black diarrhea. PT was seen today at the office. Unsure if provider was informed that all stools are black diarrhea 3-4 times daily. Pt is feeling weak and dizzy.   ? ?Reason for Disposition ? Black or tarry bowel movements (Exception: chronic-unchanged black-grey bowel movements AND is taking iron pills or Pepto-bismol) ? ?Answer Assessment - Initial Assessment Questions ?1. REASON FOR CALL or QUESTION: "What is your reason for calling today?" or "How can I best help you?" or "What question do you have that I can help answer?" ?    Timmothy Sours - OT from [facility] called regarding pt. Timmothy Sours is with pt. Pt informed Timmothy Sours that he is continuing to have dark diarrhea 3,  some times 4 times a day. ? ?PT was seen by [name] earlier today. ? ?Answer Assessment - Initial Assessment Questions ?1. APPEARANCE of BLOOD: "What color is it?" "Is it passed separately, on the surface of the stool, or mixed in with the stool?"  ?    Black - diarrhea ?2. AMOUNT: "How much blood was passed?"  ?    Unsure  ?3. FREQUENCY: "How many times has blood been passed with the stools?"  ?    3-4 times a day ?4. ONSET: "When was the blood first seen in the stools?" (Days or weeks)  ?    weeks ?5. DIARRHEA: "Is there also some diarrhea?" If Yes, ask: "How many diarrhea stools in the past 24 hours?"  ?    Yes - 3-4 days ?6. CONSTIPATION: "Do you have constipation?" If Yes, ask: "How bad is it?" ?    no ?7. RECURRENT SYMPTOMS: "Have you had blood in your stools before?" If Yes, ask: "When was the last  time?" and "What happened that time?"  ?    Before April 28th ?8. BLOOD THINNERS: "Do you take any blood thinners?" (e.g., Coumadin/warfarin, Pradaxa/dabigatran, aspirin) ?    no ?9. OTHER SYMPTOMS: "Do you have any other symptoms?"  (e.g., abdomen pain, vomiting, dizziness, fever) ?    Dizziness, weakness ?10. PREGNANCY: "Is there any chance you are pregnant?" "When was your last menstrual period?" ?      na ? ?Protocols used: Information Only Call - No Triage-A-AH, Rectal Bleeding-A-AH ? ?

## 2021-09-04 ENCOUNTER — Ambulatory Visit: Payer: Medicare Other | Admitting: Gastroenterology

## 2021-09-05 ENCOUNTER — Inpatient Hospital Stay: Payer: Medicare Other

## 2021-09-05 ENCOUNTER — Other Ambulatory Visit: Payer: Self-pay

## 2021-09-05 ENCOUNTER — Other Ambulatory Visit: Payer: Self-pay | Admitting: *Deleted

## 2021-09-05 VITALS — BP 160/88 | HR 88 | Temp 98.2°F | Resp 18 | Wt 248.8 lb

## 2021-09-05 DIAGNOSIS — C220 Liver cell carcinoma: Secondary | ICD-10-CM

## 2021-09-05 DIAGNOSIS — Z5112 Encounter for antineoplastic immunotherapy: Secondary | ICD-10-CM | POA: Diagnosis not present

## 2021-09-05 DIAGNOSIS — D500 Iron deficiency anemia secondary to blood loss (chronic): Secondary | ICD-10-CM

## 2021-09-05 DIAGNOSIS — D649 Anemia, unspecified: Secondary | ICD-10-CM

## 2021-09-05 DIAGNOSIS — K921 Melena: Secondary | ICD-10-CM

## 2021-09-05 DIAGNOSIS — K31819 Angiodysplasia of stomach and duodenum without bleeding: Secondary | ICD-10-CM

## 2021-09-05 MED ORDER — SODIUM CHLORIDE 0.9 % IV SOLN
5.0000 mg/kg | Freq: Once | INTRAVENOUS | Status: AC
  Administered 2021-09-05: 600 mg via INTRAVENOUS
  Filled 2021-09-05: qty 16

## 2021-09-05 MED ORDER — SODIUM CHLORIDE 0.9 % IV SOLN: Freq: Once | INTRAVENOUS | Status: AC

## 2021-09-05 MED ORDER — ACETAMINOPHEN 325 MG PO TABS
650.0000 mg | ORAL_TABLET | Freq: Once | ORAL | Status: AC
  Administered 2021-09-05: 650 mg via ORAL
  Filled 2021-09-05: qty 2

## 2021-09-05 MED ORDER — SODIUM CHLORIDE 0.9 % IV SOLN: Freq: Once | INTRAVENOUS | Status: DC

## 2021-09-05 MED ORDER — SODIUM CHLORIDE 0.9% IV SOLUTION
250.0000 mL | Freq: Once | INTRAVENOUS | Status: AC
  Administered 2021-09-05: 250 mL via INTRAVENOUS

## 2021-09-05 MED ORDER — SODIUM CHLORIDE 0.9 % IV SOLN
510.0000 mg | Freq: Once | INTRAVENOUS | Status: AC
  Administered 2021-09-05: 510 mg via INTRAVENOUS
  Filled 2021-09-05: qty 510

## 2021-09-05 MED ORDER — LORATADINE 10 MG PO TABS
10.0000 mg | ORAL_TABLET | Freq: Once | ORAL | Status: AC
  Administered 2021-09-05: 10 mg via ORAL
  Filled 2021-09-05: qty 1

## 2021-09-05 NOTE — Patient Outreach (Signed)
[facility]) Care Management ? ?09/05/2021 ? ?Win C Bernales ?03-Jun-1960 ?[id number] ? ?Telephone outreach for follow up on pt status  recent developments and plan of care. No answer, pt mailbox was full, sent SMS message, also sent text message. ? ?[name] Gael Londo, MSN, GNP-BC ?Gerontological Nurse Practitioner ?[facility] Care Management ?[phone] ? ? ?

## 2021-09-05 NOTE — Patient Instructions (Addendum)
[facility]  Discharge Instructions: ?Thank you for choosing [facility] to provide your oncology and hematology care.  ? ?If you have a lab appointment with the [facility], please go directly to the [facility] and check in at the registration area. ?  ?Wear comfortable clothing and clothing appropriate for easy access to any Portacath or PICC line.  ? ?We strive to give you quality time with your provider. You may need to reschedule your appointment if you arrive late (15 or more minutes).  Arriving late affects you and other patients whose appointments are after yours.  Also, if you miss three or more appointments without notifying the office, you may be dismissed from the clinic at the provider?s discretion.    ?  ?For prescription refill requests, have your pharmacy contact our office and allow 72 hours for refills to be completed.   ? ?Today you received the following chemotherapy and/or immunotherapy agents: Bevacizumab    ?  ?To help prevent nausea and vomiting after your treatment, we encourage you to take your nausea medication as directed. ? ?BELOW ARE SYMPTOMS THAT SHOULD BE REPORTED IMMEDIATELY: ?*FEVER GREATER THAN 100.4 F (38 ?C) OR HIGHER ?*CHILLS OR SWEATING ?*NAUSEA AND VOMITING THAT IS NOT CONTROLLED WITH YOUR NAUSEA MEDICATION ?*UNUSUAL SHORTNESS OF BREATH ?*UNUSUAL BRUISING OR BLEEDING ?*URINARY PROBLEMS (pain or burning when urinating, or frequent urination) ?*BOWEL PROBLEMS (unusual diarrhea, constipation, pain near the anus) ?TENDERNESS IN MOUTH AND THROAT WITH OR WITHOUT PRESENCE OF ULCERS (sore throat, sores in mouth, or a toothache) ?UNUSUAL RASH, SWELLING OR PAIN  ?UNUSUAL VAGINAL DISCHARGE OR ITCHING  ? ?Items with * indicate a potential emergency and should be followed up as soon as possible or go to the Emergency Department if any problems should occur. ? ?Please show the CHEMOTHERAPY ALERT CARD or IMMUNOTHERAPY ALERT CARD at check-in  to the Emergency Department and triage nurse. ? ?Should you have questions after your visit or need to cancel or reschedule your appointment, please contact [facility]  Dept: [phone]  and follow the prompts.  Office hours are 8:00 a.m. to 4:30 p.m. Monday - Friday. Please note that voicemails left after 4:00 p.m. may not be returned until the following business day.  We are closed weekends and major holidays. You have access to a nurse at all times for urgent questions. Please call the main number to the clinic Dept: [phone] and follow the prompts. ? ? ?For any non-urgent questions, you may also contact your provider using MyChart. We now offer e-Visits for anyone 89 and older to request care online for non-urgent symptoms. For details visit [email or web address]. ?  ?Also download the MyChart app! Go to the app store, search "MyChart", open the app, select [facility], and log in with your MyChart username and password. ? ?Due to Covid, a mask is required upon entering the hospital/clinic. If you do not have a mask, one will be given to you upon arrival. For doctor visits, patients may have 1 support person aged 12 or older with them. For treatment visits, patients cannot have anyone with them due to current Covid guidelines and our immunocompromised population.  ? ?Bevacizumab injection ?What is this medication? ?BEVACIZUMAB (be va SIZ yoo mab) is a monoclonal antibody. It is used to treat many types of cancer. ?This medicine may be used for other purposes; ask your health care provider or pharmacist if you have questions. ?COMMON BRAND NAME(S): Alymsys, Avastin,  MVASI, Noah Charon ?What should I tell my care team before I take this medication? ?They need to know if you have any of these conditions: ?diabetes ?heart disease ?high blood pressure ?history of coughing up blood ?prior anthracycline chemotherapy (e.g., doxorubicin, daunorubicin, epirubicin) ?recent or ongoing  radiation therapy ?recent or planning to have surgery ?stroke ?an unusual or allergic reaction to bevacizumab, hamster proteins, mouse proteins, other medicines, foods, dyes, or preservatives ?pregnant or trying to get pregnant ?breast-feeding ?How should I use this medication? ?This medicine is for infusion into a vein. It is given by a health care professional in a hospital or clinic setting. ?Talk to your pediatrician regarding the use of this medicine in children. Special care may be needed. ?Overdosage: If you think you have taken too much of this medicine contact a poison control center or emergency room at once. ?NOTE: This medicine is only for you. Do not share this medicine with others. ?What if I miss a dose? ?It is important not to miss your dose. Call your doctor or health care professional if you are unable to keep an appointment. ?What may interact with this medication? ?Interactions are not expected. ?This list may not describe all possible interactions. Give your health care provider a list of all the medicines, herbs, non-prescription drugs, or dietary supplements you use. Also tell them if you smoke, drink alcohol, or use illegal drugs. Some items may interact with your medicine. ?What should I watch for while using this medication? ?Your condition will be monitored carefully while you are receiving this medicine. You will need important blood work and urine testing done while you are taking this medicine. ?This medicine may increase your risk to bruise or bleed. Call your doctor or health care professional if you notice any unusual bleeding. ?Before having surgery, talk to your health care provider to make sure it is ok. This drug can increase the risk of poor healing of your surgical site or wound. You will need to stop this drug for 28 days before surgery. After surgery, wait at least 28 days before restarting this drug. Make sure the surgical site or wound is healed enough before restarting  this drug. Talk to your health care provider if questions. ?Do not become pregnant while taking this medicine or for 6 months after stopping it. Women should inform their doctor if they wish to become pregnant or think they might be pregnant. There is a potential for serious side effects to an unborn child. Talk to your health care professional or pharmacist for more information. Do not breast-feed an infant while taking this medicine and for 6 months after the last dose. ?This medicine has caused ovarian failure in some women. This medicine may interfere with the ability to have a child. You should talk to your doctor or health care professional if you are concerned about your fertility. ?What side effects may I notice from receiving this medication? ?Side effects that you should report to your doctor or health care professional as soon as possible: ?allergic reactions like skin rash, itching or hives, swelling of the face, lips, or tongue ?chest pain or chest tightness ?chills ?coughing up blood ?high fever ?seizures ?severe constipation ?signs and symptoms of bleeding such as bloody or black, tarry stools; red or dark-brown urine; spitting up blood or brown material that looks like coffee grounds; red spots on the skin; unusual bruising or bleeding from the eye, gums, or nose ?signs and symptoms of a blood clot such as breathing problems;  chest pain; severe, sudden headache; pain, swelling, warmth in the leg ?signs and symptoms of a stroke like changes in vision; confusion; trouble speaking or understanding; severe headaches; sudden numbness or weakness of the face, arm or leg; trouble walking; dizziness; loss of balance or coordination ?stomach pain ?sweating ?swelling of legs or ankles ?vomiting ?weight gain ?Side effects that usually do not require medical attention (report to your doctor or health care professional if they continue or are bothersome): ?back pain ?changes in taste ?decreased appetite ?dry  skin ?nausea ?tiredness ?This list may not describe all possible side effects. Call your doctor for medical advice about side effects. You may report side effects to FDA at 1-800-FDA-1088. ?Where should I keep my

## 2021-09-05 NOTE — Progress Notes (Signed)
[facility] for claritin and tylenol as premeds to blood instead of benadryl. And ok to treat with urine prot 100 mg/dL per Dr Burr Medico, MD.  ?

## 2021-09-05 NOTE — Progress Notes (Signed)
Pt declined to stay for 30 min post obs, wheelchair to lobby, VSS. ?

## 2021-09-06 ENCOUNTER — Other Ambulatory Visit: Payer: Self-pay | Admitting: *Deleted

## 2021-09-06 ENCOUNTER — Telehealth: Payer: Self-pay | Admitting: *Deleted

## 2021-09-06 ENCOUNTER — Telehealth (INDEPENDENT_AMBULATORY_CARE_PROVIDER_SITE_OTHER): Payer: Self-pay | Admitting: Primary Care

## 2021-09-06 LAB — TYPE AND SCREEN
ABO/RH(D): B POS
Antibody Screen: NEGATIVE
Unit division: 0

## 2021-09-06 LAB — BPAM RBC
Blood Product Expiration Date: 202305232359
ISSUE DATE / TIME: 202305111223
Unit Type and Rh: 7300

## 2021-09-06 NOTE — Telephone Encounter (Signed)
Tried to reach pt at home & mobile #'s to check on pt & how he did with recent new treatment.  Unable to leave a message since vm full on mobile & no answering machine on home #.   ?

## 2021-09-06 NOTE — Telephone Encounter (Signed)
FYI

## 2021-09-06 NOTE — Patient Outreach (Signed)
[facility]) Care Management ? ?09/06/2021 ? ?Daniel [name] ?1960-08-10 ?[id number] ? ?Patient case discussed at [facility] today. Recommended follow up call to LCSW which NP has done this pm. Also called pt for follow up which was unsuccessful left SMS notification and another text. ? ?[name] Iza Preston, MSN, GNP-BC ?Gerontological Nurse Practitioner ?[facility] Care Management ?[phone] ? ? ?

## 2021-09-06 NOTE — Telephone Encounter (Signed)
-----   Message from Charleston Poot, RN sent at 09/05/2021  4:14 PM EDT ----- ?Regarding: First Time/ Bevacizumab/ Dr Burr Medico pt ?Hello, ? ?Pt had first time bevacizumab today. Tolerated treatment well.  ? ?Thank you, ?Libbie K. ? ?

## 2021-09-06 NOTE — Telephone Encounter (Signed)
Copied from [facility] [id number]. Topic: General - Other ?>> Sep 06, 2021  1:50 PM Leward Quan A wrote: ?Reason for CRM: Glenard Haring nurse with [name] called in to inform [name] that patient never confirmed a time to meet with her so appointment today was cancelled and she will try again next week to see patient. Please advise any question call Angel at Ph#  [phone] ?

## 2021-09-10 ENCOUNTER — Other Ambulatory Visit: Payer: Medicare Other

## 2021-09-12 ENCOUNTER — Encounter: Payer: Self-pay | Admitting: *Deleted

## 2021-09-12 ENCOUNTER — Inpatient Hospital Stay: Payer: Medicare Other

## 2021-09-12 ENCOUNTER — Ambulatory Visit
Admission: RE | Admit: 2021-09-12 | Discharge: 2021-09-12 | Disposition: A | Discharge status: Home / Self Care | Payer: Medicare Other | Source: Ambulatory Visit | Attending: Hematology | Admitting: Hematology

## 2021-09-12 ENCOUNTER — Other Ambulatory Visit: Payer: Self-pay | Admitting: *Deleted

## 2021-09-12 DIAGNOSIS — C220 Liver cell carcinoma: Secondary | ICD-10-CM

## 2021-09-12 HISTORY — PX: IR RADIOLOGIST EVAL & MGMT: IMG5224

## 2021-09-12 NOTE — Patient Outreach (Signed)
[facility]) Care Management  09/12/2021  Daniel [name] 05-Mar-1961 [id number],  Third unsuccessful outreach to pt. Unable to leave voicemail, left SMS notification.  Called [facility] for update on pt engagement with them. Talked with Daniel [name], Rehab specialist at [facility]. She reviewed pt notes. Their team is still active with him. The CSW last notes indicated that pt has been given resources to seek out new residence but has not followed up. Pt is going to dialysis M-W-F and now has oncology visits to attend.  NP to send unsuccessful letter and reach out again in 2 weeks.  [name], MSN, [facility] Gerontological Nurse Practitioner [facility] Care Management [phone]

## 2021-09-12 NOTE — Consult Note (Signed)
Chief Complaint: Patient was seen in consultation today for liver masses  Referring Physician(s): Feng,Yan  History of Present Illness: Daniel [name] is a 61 y.o. male with history of hepatitis C cirrhosis and multifocal hepatocellular carcinoma initially discovered incidentally on PET/CT in March 2023 for a lung nodule.  He was recently discussed in multidisciplinary tumor board with consensus to proceed with liver targeted therapy by Interventional Radiology as he has declined surgical approach.  He is under the care of [name], who has initiated bevacizumab.    He presents today via virtual telephone visit.  He reports feeling well without abdominal pain, nausea, vomiting, fevers, jaundice, or scleral icterus.  He does complain of swelling about his left leg after amputation earlier this year.  Past Medical History:  Diagnosis Date   Anemia    Diabetes mellitus without complication [facility])    patient denies   Dialysis patient [facility])    End stage chronic kidney disease ([place])    Hypertension    ICH (intracerebral hemorrhage) ([place]) 05/20/2017   PAD (peripheral artery disease) ([facility])    Shoulder pain, left 06/28/2013    Past Surgical History:  Procedure Laterality Date   A/V FISTULAGRAM N/A 08/15/2020   Procedure: A/V FISTULAGRAM - Left Upper;  Surgeon: Cherre Robins, MD;  Location: [place] [facility];  Service: Cardiovascular;  Laterality: N/A;   ABDOMINAL AORTOGRAM W/LOWER EXTREMITY N/A 04/08/2021   Procedure: ABDOMINAL AORTOGRAM W/LOWER EXTREMITY;  Surgeon: Waynetta Sandy, MD;  Location: [place] [facility];  Service: Cardiovascular;  Laterality: N/A;   AMPUTATION Left 04/16/2021   Procedure: LEFT BELOW KNEE AMPUTATION;  Surgeon: Angelia Mould, MD;  Location: [place];  Service: Vascular;  Laterality: Left;   AMPUTATION Left 06/17/2021   Procedure: REVISION AMPUTATION BELOW KNEE;  Surgeon: Broadus John, MD;  Location: [place];  Service: Vascular;   Laterality: Left;   APPENDECTOMY     APPLICATION OF WOUND VAC Left 06/17/2021   Procedure: APPLICATION OF WOUND VAC;  Surgeon: Broadus John, MD;  Location: [place];  Service: Vascular;  Laterality: Left;   AV FISTULA PLACEMENT Left 08/03/2019   Procedure: LEFT ARM ARTERIOVENOUS (AV) CEPHALIC  FISTULA CREATION;  Surgeon: Waynetta Sandy, MD;  Location: [place];  Service: Vascular;  Laterality: Left;   BIOPSY  06/30/2019   Procedure: BIOPSY;  Surgeon: Ronald Lobo, MD;  Location: [facility] ENDOSCOPY;  Service: Endoscopy;;   BIOPSY  08/02/2019   Procedure: BIOPSY;  Surgeon: Yetta Flock, MD;  Location: [facility] ENDOSCOPY;  Service: Gastroenterology;;   BIOPSY  02/08/2021   Procedure: BIOPSY;  Surgeon: Sharyn Creamer, MD;  Location: [facility] ENDOSCOPY;  Service: Gastroenterology;;   BIOPSY  02/24/2021   Procedure: BIOPSY;  Surgeon: [name], MD;  Location: [facility] ENDOSCOPY;  Service: Gastroenterology;;   BIOPSY  03/26/2021   Procedure: BIOPSY;  Surgeon: Lavena Bullion, DO;  Location: [place];  Service: Gastroenterology;;   BIOPSY  08/06/2021   Procedure: BIOPSY;  Surgeon: [name], MD;  Location: [place];  Service: Gastroenterology;;   COLONOSCOPY  01/23/2012   Procedure: COLONOSCOPY;  Surgeon: Danie Binder, MD;  [facility];  Service: Endoscopy;  Laterality: N/A;  11:10 AM   COLONOSCOPY WITH PROPOFOL N/A 06/30/2019   Procedure: COLONOSCOPY WITH PROPOFOL;  Surgeon: Ronald Lobo, MD;  Location: [place];  Service: Endoscopy;  Laterality: N/A;   ENTEROSCOPY N/A 08/02/2019   Procedure: ENTEROSCOPY;  Surgeon: Yetta Flock, MD;  Location:  [place] ENDOSCOPY;  Service: Gastroenterology;  Laterality: N/A;   ENTEROSCOPY N/A 02/24/2021   Procedure: ENTEROSCOPY;  Surgeon: [name], MD;  Location: [facility] ENDOSCOPY;  Service: Gastroenterology;  Laterality: N/A;   ENTEROSCOPY N/A 03/26/2021   Procedure: ENTEROSCOPY;  Surgeon: Lavena Bullion, DO;   Location: [place];  Service: Gastroenterology;  Laterality: N/A;   ENTEROSCOPY N/A 07/05/2021   Procedure: ENTEROSCOPY;  Surgeon: Carol Ada, MD;  Location: [place];  Service: Gastroenterology;  Laterality: N/A;   ENTEROSCOPY N/A 08/06/2021   Procedure: ENTEROSCOPY;  Surgeon: [name], MD;  Location: [facility] ENDOSCOPY;  Service: Gastroenterology;  Laterality: N/A;   ENTEROSCOPY N/A 08/24/2021   Procedure: ENTEROSCOPY;  Surgeon: Ladene Artist, MD;  Location: [facility] ENDOSCOPY;  Service: Gastroenterology;  Laterality: N/A;   ESOPHAGOGASTRODUODENOSCOPY N/A 08/10/2020   Procedure: ESOPHAGOGASTRODUODENOSCOPY (EGD);  Surgeon: Jerene Bears, MD;  Location: [facility] ENDOSCOPY;  Service: Gastroenterology;  Laterality: N/A;   ESOPHAGOGASTRODUODENOSCOPY (EGD) WITH PROPOFOL N/A 06/30/2019   Procedure: ESOPHAGOGASTRODUODENOSCOPY (EGD) WITH PROPOFOL;  Surgeon: Ronald Lobo, MD;  Location: [place];  Service: Endoscopy;  Laterality: N/A;   ESOPHAGOGASTRODUODENOSCOPY (EGD) WITH PROPOFOL N/A 01/12/2021   Procedure: ESOPHAGOGASTRODUODENOSCOPY (EGD) WITH PROPOFOL;  Surgeon: Sharyn Creamer, MD;  Location: [place];  Service: Gastroenterology;  Laterality: N/A;   ESOPHAGOGASTRODUODENOSCOPY (EGD) WITH PROPOFOL N/A 02/08/2021   Procedure: ESOPHAGOGASTRODUODENOSCOPY (EGD) WITH PROPOFOL;  Surgeon: Sharyn Creamer, MD;  Location: [place];  Service: Gastroenterology;  Laterality: N/A;   FISTULA SUPERFICIALIZATION Left 10/17/2019   Procedure: LEFT UPPER EXTREMITY FISTULA REVISION, SIDE BRANCH LIGATION,  AND SUPERFICIALIZATION;  Surgeon: Marty Heck, MD;  Location: [place];  Service: Vascular;  Laterality: Left;   FISTULA SUPERFICIALIZATION Left 53/29/9242   Procedure: PLICATION OF ANEURYSM LEFT ARTERIOVENOUS FISTULA;  Surgeon: Angelia Mould, MD;  Location: [place];  Service: Vascular;  Laterality: Left;   FLEXIBLE SIGMOIDOSCOPY N/A 07/05/2021   Procedure: FLEXIBLE SIGMOIDOSCOPY;  Surgeon:  Carol Ada, MD;  Location: [place];  Service: Gastroenterology;  Laterality: N/A;   GIVENS CAPSULE STUDY N/A 06/30/2019   Procedure: GIVENS CAPSULE STUDY;  Surgeon: Ronald Lobo, MD;  Location: [place];  Service: Endoscopy;  Laterality: N/A;   GIVENS CAPSULE STUDY N/A 08/25/2021   Procedure: GIVENS CAPSULE STUDY;  Surgeon: Ladene Artist, MD;  Location: [place];  Service: Gastroenterology;  Laterality: N/A;   HEMOSTASIS CLIP PLACEMENT  08/10/2020   Procedure: HEMOSTASIS CLIP PLACEMENT;  Surgeon: Jerene Bears, MD;  Location: [place];  Service: Gastroenterology;;   HEMOSTASIS CLIP PLACEMENT  01/12/2021   Procedure: HEMOSTASIS CLIP PLACEMENT;  Surgeon: Sharyn Creamer, MD;  Location: [place];  Service: Gastroenterology;;   HEMOSTASIS CONTROL  08/02/2019   Procedure: HEMOSTASIS CONTROL;  Surgeon: Yetta Flock, MD;  Location: [place];  Service: Gastroenterology;;   HOT HEMOSTASIS  02/24/2021   Procedure: HOT HEMOSTASIS (ARGON PLASMA COAGULATION/BICAP);  Surgeon: [name], MD;  Location: [facility] ENDOSCOPY;  Service: Gastroenterology;;   HOT HEMOSTASIS N/A 03/26/2021   Procedure: HOT HEMOSTASIS (ARGON PLASMA COAGULATION/BICAP);  Surgeon: Lavena Bullion, DO;  Location: [facility] ENDOSCOPY;  Service: Gastroenterology;  Laterality: N/A;   HOT HEMOSTASIS N/A 07/05/2021   Procedure: HOT HEMOSTASIS (ARGON PLASMA COAGULATION/BICAP);  Surgeon: Carol Ada, MD;  Location: [place];  Service: Gastroenterology;  Laterality: N/A;   INCISION AND DRAINAGE ABSCESS N/A 06/29/2016   Procedure: INCISION AND DRAINAGE ABDOMINAL WALL ABSCESS;  Surgeon: Alphonsa Overall, MD;  Location: [facility];  Service: General;  Laterality: N/A;   INSERTION OF  DIALYSIS CATHETER Right 08/03/2019   Procedure: INSERTION OF DIALYSIS CATHETER;  Surgeon: Waynetta Sandy, MD;  Location: [place];  Service: Vascular;  Laterality: Right;   INSERTION OF DIALYSIS CATHETER Right 10/22/2019   Procedure:  INSERTION OF 23CM TUNNELED DIALYSIS CATHETER RIGHT INTERNAL JUGULAR;  Surgeon: Angelia Mould, MD;  Location: [place];  Service: Vascular;  Laterality: Right;   INSERTION OF DIALYSIS CATHETER Right 08/12/2020   Procedure: INSERTION OF Right internal Jugular TUNNELED  DIALYSIS CATHETER.;  Surgeon: Waynetta Sandy, MD;  Location: [place];  Service: Vascular;  Laterality: Right;   INSERTION OF DIALYSIS CATHETER N/A 04/19/2021   Procedure: INSERTION OF TUNNELED DIALYSIS CATHETER;  Surgeon: Angelia Mould, MD;  Location: [facility] OR;  Service: Vascular;  Laterality: N/A;   Left heel surgery     LOWER EXTREMITY ANGIOGRAPHY N/A 07/08/2021   Procedure: Lower Extremity Angiography;  Surgeon: Cherre Robins, MD;  Location: [place] [facility];  Service: Cardiovascular;  Laterality: N/A;   PENILE BIOPSY N/A 03/26/2020   Procedure: PENILE ULCER DEBRIDEMENT;  Surgeon: Remi Haggard, MD;  Location: [facility];  Service: Urology;  Laterality: N/A;  30 MINS   PERIPHERAL VASCULAR INTERVENTION Right 07/08/2021   Procedure: PERIPHERAL VASCULAR INTERVENTION;  Surgeon: Cherre Robins, MD;  Location: [place] [facility];  Service: Cardiovascular;  Laterality: Right;   SCLEROTHERAPY  01/12/2021   Procedure: SCLEROTHERAPY;  Surgeon: Sharyn Creamer, MD;  Location: [place];  Service: Gastroenterology;;   Clide Deutscher  02/24/2021   Procedure: Clide Deutscher;  Surgeon: [name], MD;  Location: [place];  Service: Gastroenterology;;   SUBMUCOSAL TATTOO INJECTION  08/24/2021   Procedure: SUBMUCOSAL TATTOO INJECTION;  Surgeon: Ladene Artist, MD;  Location: [facility] ENDOSCOPY;  Service: Gastroenterology;;    Allergies: Seroquel [quetiapine] and Dilaudid [hydromorphone hcl]  Medications: Prior to Admission medications   Medication Sig Start Date End Date Taking? Authorizing Provider  amLODipine (NORVASC) 2.5 MG tablet Take 1 tablet (2.5 mg total) by mouth at bedtime. Patient taking  differently: Take 2.5 mg by mouth daily. 08/10/21   Bonnielee Haff, MD  aspirin 81 MG EC tablet Take 1 tablet (81 mg total) by mouth daily. Swallow whole. 08/10/21   Bonnielee Haff, MD  atorvastatin (LIPITOR) 40 MG tablet Take 1 tablet (40 mg total) by mouth daily. 08/20/21   Baglia, Corrina, PA-C  calcium acetate, Phos Binder, (PHOSLYRA) 667 MG/5ML SOLN Take 667 mg by mouth 3 (three) times daily with meals.    [provider]  Darbepoetin Alfa (ARANESP) 100 MCG/0.5ML SOSY injection Inject 0.5 mLs (100 mcg total) into the vein every 7 (seven) days for 8 doses. 08/19/21 10/08/21  Bonnell Public, MD  gabapentin (NEURONTIN) 100 MG capsule Take 1 capsule (100 mg total) by mouth 3 (three) times daily. 08/10/21   Bonnielee Haff, MD  HYDROcodone-acetaminophen (NORCO/VICODIN) 5-325 MG tablet Take 1 tablet by mouth every 4 (four) hours as needed for severe pain. 08/27/21   Bonnielee Haff, MD  methocarbamol (ROBAXIN) 500 MG tablet Take 1 tablet (500 mg total) by mouth every 6 (six) hours as needed for muscle spasms. 08/27/21   Bonnielee Haff, MD  pantoprazole (PROTONIX) 40 MG tablet Take 1 tablet (40 mg total) by mouth 2 (two) times daily before a meal. 08/27/21   Bonnielee Haff, MD  traZODone (DESYREL) 50 MG tablet Take 0.5-1 tablets (25-50 mg total) by mouth at bedtime as needed for sleep. 09/03/21   [name], NP  colchicine 0.6 MG tablet  Take 0.5 tablets (0.3 mg total) by mouth 2 (two) times daily. 07/24/20 07/29/20  Noemi Chapel, MD  furosemide (LASIX) 40 MG tablet Take 1 tablet (40 mg total) by mouth daily. 07/25/19 02/09/20  Ladona Horns, MD     Family History  Problem Relation Age of Onset   Colon cancer Neg Hx     Social History   Socioeconomic History   Marital status: Single    Spouse name: Not on file   Number of children: 2   Years of education: 37   Highest education level: 12th grade  Occupational History   Occupation: disabled  Tobacco Use   Smoking status: Every Day     Packs/day: 0.50    Years: 45.00    Pack years: 22.50    Types: Cigarettes   Smokeless tobacco: Never   Tobacco comments:    Pt left before information given  Vaping Use   Vaping Use: Never used  Substance and Sexual Activity   Alcohol use: Not Currently   Drug use: Not Currently    Types: "Crack" cocaine    Comment: last in 2020   Sexual activity: Yes    Birth control/protection: None  Other Topics Concern   Not on file  Social History Narrative   Goes to dialysis M-W-F, [facility] using a wheelchair at present, waiting for prosthetic. Looking for handicapped housing for immediate move in.   Social Determinants of Health   Financial Resource Strain: Not on file  Food Insecurity: No Food Insecurity   Worried About Charity fundraiser in the Last Year: Never true   Ran Out of Food in the Last Year: Never true  Transportation Needs: No Transportation Needs   Lack of Transportation (Medical): No   Lack of Transportation (Non-Medical): No  Physical Activity: Not on file  Stress: Not on file  Social Connections: Not on file    ECOG Status: 2 - Symptomatic, <50% confined to bed  Review of Systems: A 12 point ROS discussed and pertinent positives are indicated in the HPI above.  All other systems are negative.  Vital Signs: There were no vitals taken for this visit.  No physical examination was performed in lieu of virtual telephone clinic visit.   Imaging: CT AP 08/05/21  Seg IV, 4.4 cm (LR5)    Seg VIII, 2.2 cm (LR3)  Labs:  CBC: Recent Labs    08/26/21 0346 08/27/21 0328 08/30/21 1203 09/03/21 1150  WBC 5.8 5.5 4.5 4.7  HGB 8.4* 8.3* 7.1* 6.0*  HCT 25.3* 24.5* 22.7* 19.0*  PLT 153 148* 162 156    COAGS: Recent Labs    10/28/20 0623 03/24/21 1831 07/03/21 1700 07/04/21 0554  INR 1.1 1.0 1.1 1.1  APTT 34  --   --   --     BMP: Recent Labs    08/25/21 0121 08/26/21 0346 08/27/21 0328 08/30/21 1203  NA 130* 130* 134* 142  K 4.0 4.5 3.7 3.6   CL 93* 91* 95* 104  CO2 '24 22 22 29  '$ GLUCOSE 81 85 100* 111*  BUN 24* 33* 14 25*  CALCIUM 8.7* 8.5* 8.2* 8.8*  CREATININE 5.45* 7.10* 4.19* 6.00*  GFRNONAA 11* 8* 15* 10*    LIVER FUNCTION TESTS: Recent Labs    08/16/21 2035 08/17/21 0258 08/18/21 0059 08/23/21 0558 08/24/21 0301 08/25/21 0121 08/26/21 0346 08/27/21 0328 08/30/21 1203  BILITOT 0.9 1.1  --  0.6  --   --   --   --  0.5  AST 38 39  --  62*  --   --   --   --  61*  ALT 15 15  --  21  --   --   --   --  20  ALKPHOS 77 72  --  93  --   --   --   --  102  PROT 7.1 7.1  --  7.5  --   --   --   --  8.1  ALBUMIN 2.3* 2.2*   < > 2.1*   < > 2.2* 2.3* 2.3* 3.1*   < > = values in this interval not displayed.    TUMOR MARKERS: AFP 13.9 (08/07/21), 10.5 (08/30/21)  Assessment and Plan: 61 year old male with history of hepatitis C cirrhosis (Child Pugh A, ALBI grade 2) and unifocal hepatocellular carcinoma, currently receiving bevacizumab. We discussed in depth the treatment options offered by Interventional Radiology.  He would be an excellent candidate for transarterial radioembolization with segmentectomy technique for the segment IV mass.  During our planning procedure, we can evaluate the LR3 mass in segment VIII for growth or enhancement characteristics, and possibly treat concomitantly if suspicious.  Due to heavy atherosclerotic plaque burden about his bilateral common femoral arteries, we will plan for left radial artery approach.  Risks and benefits discussed with the patient including, but not limited to bleeding, infection, vascular injury, post procedural pain, nausea, vomiting and fatigue, contrast induced renal failure, liver failure, radiation injury to the bowel, radiation induced cholecystitis, neutropenia and possible need for additional procedures.  All of the patient's questions were answered, patient is agreeable to proceed.   Electronically Signed: Suzette Battiest, MD 09/12/2021, 2:02 PM   I spent a  total of  40 Minutes  in virtual telephone clinical consultation, greater than 50% of which was counseling/coordinating care for hepatocellular carcinoma.

## 2021-09-13 ENCOUNTER — Ambulatory Visit: Payer: Medicare Other | Admitting: Acute Care

## 2021-09-13 NOTE — Progress Notes (Deleted)
History of Present Illness Daniel [name] is a 61 y.o. male with ***   09/13/2021  Test Results:     Latest Ref Rng & Units 09/03/2021   11:50 AM 08/30/2021   12:03 PM 08/27/2021    3:28 AM  CBC  WBC 4.0 - 10.5 K/uL 4.7   4.5   5.5    Hemoglobin 13.0 - 17.0 g/dL 6.0   7.1   8.3    Hematocrit 39.0 - 52.0 % 19.0   22.7   24.5    Platelets 150 - 400 K/uL 156   162   148         Latest Ref Rng & Units 08/30/2021   12:03 PM 08/27/2021    3:28 AM 08/26/2021    3:46 AM  BMP  Glucose 70 - 99 mg/dL 111   100   85    BUN 8 - 23 mg/dL 25   14   33    Creatinine 0.61 - 1.24 mg/dL 6.00   4.19   7.10    Sodium 135 - 145 mmol/L 142   134   130    Potassium 3.5 - 5.1 mmol/L 3.6   3.7   4.5    Chloride 98 - 111 mmol/L 104   95   91    CO2 22 - 32 mmol/L '29   22   22    '$ Calcium 8.9 - 10.3 mg/dL 8.8   8.2   8.5      BNP    Component Value Date/Time   BNP 521.8 (H) 06/14/2021 0356    ProBNP    Component Value Date/Time   PROBNP <30.0 05/17/2010 1134    PFT    Component Value Date/Time   FEV1PRE 1.78 07/09/2021 1420   FEV1POST 2.07 07/09/2021 1420   FVCPRE 2.08 07/09/2021 1420   FVCPOST 2.40 07/09/2021 1420   TLC 4.14 07/09/2021 1420   DLCOUNC 11.16 07/09/2021 1420   PREFEV1FVCRT 85 07/09/2021 1420   PSTFEV1FVCRT 86 07/09/2021 1420    DG Thoracic Spine 2 View  Result Date: 08/24/2021 CLINICAL DATA:  Pain in the thoracic spine without reported history of trauma. EXAM: THORACIC SPINE 2 VIEWS COMPARISON:  Prior PET exam from July 23, 2021. FINDINGS: Spinal alignment is maintained. Signs of degenerative changes throughout the thoracic spine. No visible fracture signs of static malalignment. Upper thoracic spine with limited assessment due to overlap on lateral projection. Mid and lower thoracic spine are well evaluated. IMPRESSION: 1. Signs of degenerative changes throughout the thoracic spine. 2. No visible fracture or static malalignment, mildly limited assessment as below. 3.  Limited evaluation of the upper thoracic spine due to a bony structures and presence of vascular congestion and pulmonary edema seen on previous imaging. Electronically Signed   By: Zetta Bills M.D.   On: 08/24/2021 16:49   MR THORACIC SPINE WO CONTRAST  Result Date: 08/27/2021 CLINICAL DATA:  Mid-back pain EXAM: MRI THORACIC SPINE WITHOUT CONTRAST TECHNIQUE: Multiplanar, multisequence MR imaging of the thoracic spine was performed. No intravenous contrast was administered. COMPARISON:  Radiograph 08/24/2021 FINDINGS: Poor image quality due to motion artifact. Alignment:  Physiologic. Vertebrae: There is no evidence of acute fracture, discitis, or aggressive osseous lesion. There is heterogeneous marrow signal, mildly low diffusely on T1. Cord: Poorly evaluated due to image quality. There is possible focal abnormal cord signal at the level of T6-T7. Paraspinal and other soft tissues: Small bilateral pleural effusions. Mild paraspinal soft tissue edema in  the lower cervical spine, partially visualized. Disc levels: Pertinent levels below: No spinal canal stenosis or neural foraminal narrowing from T1 through T5. T5-T6: There is right-sided bony spurring and ligamentum flavum thickening resulting in mild right lateral recess narrowing. On the left, there is a focal left paracentral/foraminal disc protrusion resulting in mild to moderate left-sided neural foraminal stenosis. T6-T7: Right paracentral disc osteophyte complex results in indentation of the anterior cord with questionable associated abnormal cord signal. There is no significant neural foraminal stenosis. T7-T8: Small right paracentral disc osteophyte complex abuts the ventral cord without deformation. T8-T9: Left paracentral disc protrusion results in mild left lateral recess narrowing. No significant neural foraminal stenosis. T9-T10: Asymmetric left disc bulging and ligamentum flavum hypertrophy results in moderate spinal canal stenosis, mild to  moderate left neural foraminal stenosis. T10-T11: Asymmetric right disc bulging, ligament flavum hypertrophy results in mild spinal canal narrowing. There is moderate bilateral neural foraminal stenosis. T11-T12: Broad-based disc bulging and bilateral facet arthropathy. Mild spinal canal narrowing. Moderate bilateral neural foraminal stenosis. T12-L1: Broad-based disc bulging results in minimal spinal canal narrowing and moderate left and mild right foraminal narrowing. 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 Of note, there is a right paracentral disc osteophyte complex at T6-T7 resulting in indentation of the ventral cord with questionable associated abnormal cord signal. Heterogeneous marrow signal with generalized low T1 signal, nonspecific but can be seen in hematologic derangement such as anemia. Mild posterior paraspinal soft tissue edema in the lower cervical spine, partially visualized. Small bilateral pleural effusions. Electronically Signed   By: Maurine Simmering M.D.   On: 08/27/2021 10:56   DG Chest Port 1 View  Result Date: 08/16/2021 CLINICAL DATA:  Possible sepsis EXAM: PORTABLE CHEST 1 VIEW COMPARISON:  07/03/2021 FINDINGS: Cardiomegaly with mild central congestion and probable low-grade edema. No pleural effusion or pneumothorax. Aortic atherosclerosis. IMPRESSION: Cardiomegaly with vascular congestion and probable mild edema. Electronically Signed   By: Donavan Foil M.D.   On: 08/16/2021 21:30   DG Knee Left Port  Result Date: 08/16/2021 CLINICAL DATA:  Left knee pain.  Recent patellar fracture. EXAM: PORTABLE LEFT KNEE - 1-2 VIEW COMPARISON:  None. FINDINGS: Status post below-knee amputation. Numerous vascular calcifications. Nonunited transverse patellar fracture. Decreased size of suprapatellar effusion. IMPRESSION: 1. Nonunited  transverse patellar fracture with decreased size of suprapatellar effusion. No new acute findings. 2. Status post below-knee amputation. Electronically Signed   By: Ulyses Jarred M.D.   On: 08/16/2021 21:30   VAS [place] ABI WITH/WO TBI  Result Date: 08/20/2021  LOWER EXTREMITY DOPPLER STUDY Patient Name:  KAMRAN COKER  Date of Exam:   08/20/2021 Medical Rec #: [id number]           Accession #:    [id number] Date of Birth: [birth year]           Patient Gender: M Patient Age:   76 years Exam Location:  [facility] Procedure:      VAS [place] ABI WITH/WO TBI Referring Phys: --------------------------------------------------------------------------------  Indications: Stent evaluation.  Vascular Interventions: PRE-OPERATIVE DIAGNOSIS: 07/08/2021: Atherosclerosis of                         native arteries of right lower extremity causing  ischemic rest pain                          POST-OPERATIVE DIAGNOSIS: Same                          PROCEDURE:                         1) US guided left common femoral access                         2) Aortogram                         3) Right lower extremity angiogram with third order                         cannulation (151m total contrast)                         4) Right superficial femoral artery angioplasty and                         stenting ([id number])                         5) Conscious sedation (42 minutes). Performing Technologist: ErRonal FearVS, RCS  Examination Guidelines: A complete evaluation includes at minimum, Doppler waveform signals and systolic blood pressure reading at the level of bilateral brachial, anterior tibial, and posterior tibial arteries, when vessel segments are accessible. Bilateral testing is considered an integral part of a complete examination. Photoelectric Plethysmograph (PPG) waveforms and toe systolic pressure readings are included as required and additional duplex testing as needed. Limited  examinations for reoccurring indications may be performed as noted.  ABI Findings: +---------+------------------+-----+----------+----------------+ Right    Rt Pressure (mmHg)IndexWaveform  Comment          +---------+------------------+-----+----------+----------------+ Brachial                        triphasic Non-compressible +---------+------------------+-----+----------+----------------+ PTA                             triphasic Non-compressible +---------+------------------+-----+----------+----------------+ DP                              monophasicNon-compressible +---------+------------------+-----+----------+----------------+ Great Toe100                                               +---------+------------------+-----+----------+----------------+ +--------+------------------+-----+--------+-------+ Left    Lt Pressure (mmHg)IndexWaveformComment +--------+------------------+-----+--------+-------+ Brachial                               AVF     +--------+------------------+-----+--------+-------+ PTA                                    BKA     +--------+------------------+-----+--------+-------+ DP  BKA     +--------+------------------+-----+--------+-------+ +-------+-----------+-----------+------------+------------+ ABI/TBIToday's ABIToday's TBIPrevious ABIPrevious TBI +-------+-----------+-----------+------------+------------+ Right  [place]         [place]         [place]          0.48         +-------+-----------+-----------+------------+------------+   Summary: Right: Resting right ankle-brachial index indicates noncompressible right lower extremity arteries. The great toe pressure is 100 mmHg. Pre-intervention was 62 mmHg. *See table(s) above for measurements and observations.  Electronically signed by Monica Martinez MD on 08/20/2021 at 3:38:38 PM.    Final    VAS [place] LOWER EXTREMITY ARTERIAL DUPLEX  Result  Date: 08/20/2021 LOWER EXTREMITY ARTERIAL DUPLEX STUDY Patient Name:  TARAY NORMOYLE  Date of Exam:   08/20/2021 Medical Rec #: [id number]           Accession #:    [id number] Date of Birth: Jul 07, 1960           Patient Gender: M Patient Age:   47 years Exam Location:  [facility] Procedure:      VAS [place] LOWER EXTREMITY ARTERIAL DUPLEX Referring Phys: Jamelle Haring --------------------------------------------------------------------------------  Indications: Stent evaluation. High Risk Factors: Diabetes.  Vascular Interventions: PRE-OPERATIVE DIAGNOSIS: 07/08/2021: Atherosclerosis of                         native arteries of right lower extremity causing                         ischemic rest pain                          POST-OPERATIVE DIAGNOSIS: Same                          PROCEDURE:                         1) US guided left common femoral access                         2) Aortogram                         3) Right lower extremity angiogram with third order                         cannulation (166m total contrast)                         4) Right superficial femoral artery angioplasty and                         stenting ([id number])                         5) Conscious sedation (42 minutes). Current ABI:            R=[place], L=BKA Performing Technologist: ErRonal FearVS, RCS  Examination Guidelines: A complete evaluation includes B-mode imaging, spectral Doppler, color Doppler, and power Doppler as needed of all accessible portions of each vessel. Bilateral testing is considered an integral part of a complete examination. Limited examinations for reoccurring indications may be performed as noted.  Right Stent(s): +---------------+--------+--------+--------+--------+ mid SFA        PSV cm/sStenosisWaveformComments +---------------+--------+--------+--------+--------+ Prox to Stent  93              biphasic         +---------------+--------+--------+--------+--------+ Proximal  Stent 121             biphasic         +---------------+--------+--------+--------+--------+ Mid Stent      162             biphasic         +---------------+--------+--------+--------+--------+ Distal Stent   122             biphasic         +---------------+--------+--------+--------+--------+ Distal to Stent133             biphasic         +---------------+--------+--------+--------+--------+    Summary: Right: Widely patent superficial femoral artery stent without hemodynamically significant stenosis.  See table(s) above for measurements and observations. Electronically signed by Monica Martinez MD on 08/20/2021 at 4:26:44 PM.    Final    IR Radiologist Eval & Mgmt  Result Date: 09/12/2021 Please refer to notes tab for details about interventional procedure. (Op Note)    Past medical hx Past Medical History:  Diagnosis Date   Anemia    Diabetes mellitus without complication ([place])    patient denies   Dialysis patient [facility])    End stage chronic kidney disease ([place])    Hypertension    ICH (intracerebral hemorrhage) ([place]) 05/20/2017   PAD (peripheral artery disease) ([facility])    Shoulder pain, left 06/28/2013     Social History   Tobacco Use   Smoking status: Every Day    Packs/day: 0.50    Years: 45.00    Pack years: 22.50    Types: Cigarettes   Smokeless tobacco: Never   Tobacco comments:    Pt left before information given  Vaping Use   Vaping Use: Never used  Substance Use Topics   Alcohol use: Not Currently   Drug use: Not Currently    Types: "Crack" cocaine    Comment: last in 2020    Mr.Isola reports that he has been smoking cigarettes. He has a 22.50 pack-year smoking history. He has never used smokeless tobacco. He reports that he does not currently use alcohol. He reports that he does not currently use drugs after having used the following drugs: "Crack" cocaine.  Tobacco Cessation: Ready to quit: Not Answered Counseling given: Not  Answered Tobacco comments: Pt left before information given   Past surgical hx, Family hx, Social hx all reviewed.  Current Outpatient Medications on File Prior to Visit  Medication Sig   amLODipine (NORVASC) 2.5 MG tablet Take 1 tablet (2.5 mg total) by mouth at bedtime. (Patient taking differently: Take 2.5 mg by mouth daily.)   aspirin 81 MG EC tablet Take 1 tablet (81 mg total) by mouth daily. Swallow whole.   atorvastatin (LIPITOR) 40 MG tablet Take 1 tablet (40 mg total) by mouth daily.   calcium acetate, Phos Binder, (PHOSLYRA) 667 MG/5ML SOLN Take 667 mg by mouth 3 (three) times daily with meals.   Darbepoetin Alfa (ARANESP) 100 MCG/0.5ML SOSY injection Inject 0.5 mLs (100 mcg total) into the vein every 7 (seven) days for 8 doses.   gabapentin (NEURONTIN) 100 MG capsule Take 1 capsule (100 mg total) by mouth 3 (three) times daily.   HYDROcodone-acetaminophen (NORCO/VICODIN) 5-325 MG tablet Take 1  tablet by mouth every 4 (four) hours as needed for severe pain.   methocarbamol (ROBAXIN) 500 MG tablet Take 1 tablet (500 mg total) by mouth every 6 (six) hours as needed for muscle spasms.   pantoprazole (PROTONIX) 40 MG tablet Take 1 tablet (40 mg total) by mouth 2 (two) times daily before a meal.   traZODone (DESYREL) 50 MG tablet Take 0.5-1 tablets (25-50 mg total) by mouth at bedtime as needed for sleep.   [DISCONTINUED] colchicine 0.6 MG tablet Take 0.5 tablets (0.3 mg total) by mouth 2 (two) times daily.   [DISCONTINUED] furosemide (LASIX) 40 MG tablet Take 1 tablet (40 mg total) by mouth daily.   No current facility-administered medications on file prior to visit.     Allergies  Allergen Reactions   Seroquel [Quetiapine] Other (See Comments)    Tardive kinesia/dystonia   Dilaudid [Hydromorphone Hcl] Itching and Other (See Comments)    Pt reports itchiness after IM injection     Review Of Systems:  Constitutional:   No  weight loss, night sweats,  Fevers, chills, fatigue, or   lassitude.  HEENT:   No headaches,  Difficulty swallowing,  Tooth/dental problems, or  Sore throat,                No sneezing, itching, ear ache, nasal congestion, post nasal drip,   CV:  No chest pain,  Orthopnea, PND, swelling in lower extremities, anasarca, dizziness, palpitations, syncope.   GI  No heartburn, indigestion, abdominal pain, nausea, vomiting, diarrhea, change in bowel habits, loss of appetite, bloody stools.   Resp: No shortness of breath with exertion or at rest.  No excess mucus, no productive cough,  No non-productive cough,  No coughing up of blood.  No change in color of mucus.  No wheezing.  No chest wall deformity  Skin: no rash or lesions.  GU: no dysuria, change in color of urine, no urgency or frequency.  No flank pain, no hematuria   MS:  No joint pain or swelling.  No decreased range of motion.  No back pain.  Psych:  No change in mood or affect. No depression or anxiety.  No memory loss.   Vital Signs There were no vitals taken for this visit.   Physical Exam:  General- No distress,  A&Ox3 ENT: No sinus tenderness, TM clear, pale nasal mucosa, no oral exudate,no post nasal drip, no LAN Cardiac: S1, S2, regular rate and rhythm, no murmur Chest: No wheeze/ rales/ dullness; no accessory muscle use, no nasal flaring, no sternal retractions Abd.: Soft Non-tender Ext: No clubbing cyanosis, edema Neuro:  normal strength Skin: No rashes, warm and dry Psych: normal mood and behavior   Assessment/Plan  No problem-specific Assessment & Plan notes found for this encounter.    Magdalen Spatz, NP 09/13/2021  8:52 AM

## 2021-09-14 ENCOUNTER — Inpatient Hospital Stay (HOSPITAL_COMMUNITY)
Admission: EM | Admit: 2021-09-14 | Discharge: 2021-09-18 | DRG: 377 | Disposition: A | Discharge status: Home Health | Payer: Medicare Other | Attending: Internal Medicine | Admitting: Internal Medicine

## 2021-09-14 ENCOUNTER — Encounter (HOSPITAL_COMMUNITY): Payer: Self-pay | Admitting: Emergency Medicine

## 2021-09-14 ENCOUNTER — Other Ambulatory Visit: Payer: Self-pay

## 2021-09-14 ENCOUNTER — Emergency Department (HOSPITAL_COMMUNITY): Payer: Medicare Other

## 2021-09-14 ENCOUNTER — Inpatient Hospital Stay (HOSPITAL_COMMUNITY): Payer: Medicare Other

## 2021-09-14 DIAGNOSIS — E8809 Other disorders of plasma-protein metabolism, not elsewhere classified: Secondary | ICD-10-CM | POA: Diagnosis present

## 2021-09-14 DIAGNOSIS — Z992 Dependence on renal dialysis: Secondary | ICD-10-CM | POA: Diagnosis not present

## 2021-09-14 DIAGNOSIS — D62 Acute posthemorrhagic anemia: Secondary | ICD-10-CM | POA: Diagnosis present

## 2021-09-14 DIAGNOSIS — K31811 Angiodysplasia of stomach and duodenum with bleeding: Principal | ICD-10-CM | POA: Diagnosis present

## 2021-09-14 DIAGNOSIS — B182 Chronic viral hepatitis C: Secondary | ICD-10-CM | POA: Diagnosis present

## 2021-09-14 DIAGNOSIS — K921 Melena: Secondary | ICD-10-CM | POA: Diagnosis not present

## 2021-09-14 DIAGNOSIS — E876 Hypokalemia: Secondary | ICD-10-CM | POA: Diagnosis not present

## 2021-09-14 DIAGNOSIS — K922 Gastrointestinal hemorrhage, unspecified: Secondary | ICD-10-CM | POA: Diagnosis present

## 2021-09-14 DIAGNOSIS — F1010 Alcohol abuse, uncomplicated: Secondary | ICD-10-CM | POA: Diagnosis present

## 2021-09-14 DIAGNOSIS — E860 Dehydration: Secondary | ICD-10-CM | POA: Diagnosis present

## 2021-09-14 DIAGNOSIS — Z91158 Patient's noncompliance with renal dialysis for other reason: Secondary | ICD-10-CM | POA: Diagnosis not present

## 2021-09-14 DIAGNOSIS — D631 Anemia in chronic kidney disease: Secondary | ICD-10-CM | POA: Diagnosis present

## 2021-09-14 DIAGNOSIS — R5383 Other fatigue: Secondary | ICD-10-CM

## 2021-09-14 DIAGNOSIS — Z885 Allergy status to narcotic agent status: Secondary | ICD-10-CM

## 2021-09-14 DIAGNOSIS — K317 Polyp of stomach and duodenum: Secondary | ICD-10-CM | POA: Diagnosis present

## 2021-09-14 DIAGNOSIS — E871 Hypo-osmolality and hyponatremia: Secondary | ICD-10-CM | POA: Diagnosis not present

## 2021-09-14 DIAGNOSIS — C220 Liver cell carcinoma: Secondary | ICD-10-CM | POA: Diagnosis present

## 2021-09-14 DIAGNOSIS — K3189 Other diseases of stomach and duodenum: Secondary | ICD-10-CM | POA: Diagnosis present

## 2021-09-14 DIAGNOSIS — K766 Portal hypertension: Secondary | ICD-10-CM | POA: Diagnosis present

## 2021-09-14 DIAGNOSIS — I5033 Acute on chronic diastolic (congestive) heart failure: Secondary | ICD-10-CM | POA: Diagnosis present

## 2021-09-14 DIAGNOSIS — Z515 Encounter for palliative care: Secondary | ICD-10-CM | POA: Diagnosis not present

## 2021-09-14 DIAGNOSIS — G8929 Other chronic pain: Secondary | ICD-10-CM | POA: Diagnosis present

## 2021-09-14 DIAGNOSIS — M25512 Pain in left shoulder: Secondary | ICD-10-CM | POA: Diagnosis present

## 2021-09-14 DIAGNOSIS — Z888 Allergy status to other drugs, medicaments and biological substances status: Secondary | ICD-10-CM

## 2021-09-14 DIAGNOSIS — Z95820 Peripheral vascular angioplasty status with implants and grafts: Secondary | ICD-10-CM

## 2021-09-14 DIAGNOSIS — M25511 Pain in right shoulder: Secondary | ICD-10-CM | POA: Diagnosis present

## 2021-09-14 DIAGNOSIS — Z79899 Other long term (current) drug therapy: Secondary | ICD-10-CM

## 2021-09-14 DIAGNOSIS — I8511 Secondary esophageal varices with bleeding: Secondary | ICD-10-CM | POA: Diagnosis present

## 2021-09-14 DIAGNOSIS — R918 Other nonspecific abnormal finding of lung field: Secondary | ICD-10-CM | POA: Diagnosis present

## 2021-09-14 DIAGNOSIS — Z6835 Body mass index (BMI) 35.0-35.9, adult: Secondary | ICD-10-CM

## 2021-09-14 DIAGNOSIS — N186 End stage renal disease: Secondary | ICD-10-CM

## 2021-09-14 DIAGNOSIS — I132 Hypertensive heart and chronic kidney disease with heart failure and with stage 5 chronic kidney disease, or end stage renal disease: Secondary | ICD-10-CM | POA: Diagnosis present

## 2021-09-14 DIAGNOSIS — R0602 Shortness of breath: Secondary | ICD-10-CM

## 2021-09-14 DIAGNOSIS — D649 Anemia, unspecified: Secondary | ICD-10-CM | POA: Diagnosis not present

## 2021-09-14 DIAGNOSIS — E669 Obesity, unspecified: Secondary | ICD-10-CM | POA: Diagnosis present

## 2021-09-14 DIAGNOSIS — Z89512 Acquired absence of left leg below knee: Secondary | ICD-10-CM

## 2021-09-14 DIAGNOSIS — E1122 Type 2 diabetes mellitus with diabetic chronic kidney disease: Secondary | ICD-10-CM | POA: Diagnosis present

## 2021-09-14 DIAGNOSIS — M546 Pain in thoracic spine: Secondary | ICD-10-CM | POA: Diagnosis present

## 2021-09-14 DIAGNOSIS — Z7982 Long term (current) use of aspirin: Secondary | ICD-10-CM

## 2021-09-14 DIAGNOSIS — K5521 Angiodysplasia of colon with hemorrhage: Secondary | ICD-10-CM | POA: Diagnosis not present

## 2021-09-14 DIAGNOSIS — K7030 Alcoholic cirrhosis of liver without ascites: Secondary | ICD-10-CM | POA: Diagnosis present

## 2021-09-14 DIAGNOSIS — M898X9 Other specified disorders of bone, unspecified site: Secondary | ICD-10-CM | POA: Diagnosis present

## 2021-09-14 DIAGNOSIS — S46019A Strain of muscle(s) and tendon(s) of the rotator cuff of unspecified shoulder, initial encounter: Secondary | ICD-10-CM | POA: Diagnosis not present

## 2021-09-14 DIAGNOSIS — Z8673 Personal history of transient ischemic attack (TIA), and cerebral infarction without residual deficits: Secondary | ICD-10-CM

## 2021-09-14 DIAGNOSIS — F17210 Nicotine dependence, cigarettes, uncomplicated: Secondary | ICD-10-CM | POA: Diagnosis present

## 2021-09-14 DIAGNOSIS — A048 Other specified bacterial intestinal infections: Secondary | ICD-10-CM

## 2021-09-14 DIAGNOSIS — I8510 Secondary esophageal varices without bleeding: Secondary | ICD-10-CM | POA: Diagnosis not present

## 2021-09-14 DIAGNOSIS — I8501 Esophageal varices with bleeding: Secondary | ICD-10-CM | POA: Diagnosis not present

## 2021-09-14 LAB — CBC
HCT: 18.2 % — ABNORMAL LOW (ref 39.0–52.0)
HCT: 18.5 % — ABNORMAL LOW (ref 39.0–52.0)
Hemoglobin: 5.9 g/dL — CL (ref 13.0–17.0)
Hemoglobin: 6.2 g/dL — CL (ref 13.0–17.0)
MCH: 35.1 pg — ABNORMAL HIGH (ref 26.0–34.0)
MCH: 34.1 pg — ABNORMAL HIGH (ref 26.0–34.0)
MCHC: 32.4 g/dL (ref 30.0–36.0)
MCHC: 33.5 g/dL (ref 30.0–36.0)
MCV: 108.3 fL — ABNORMAL HIGH (ref 80.0–100.0)
MCV: 101.6 fL — ABNORMAL HIGH (ref 80.0–100.0)
Platelets: 131 10*3/uL — ABNORMAL LOW (ref 150–400)
Platelets: 127 10*3/uL — ABNORMAL LOW (ref 150–400)
RBC: 1.68 MIL/uL — ABNORMAL LOW (ref 4.22–5.81)
RBC: 1.82 MIL/uL — ABNORMAL LOW (ref 4.22–5.81)
RDW: 21.2 % — ABNORMAL HIGH (ref 11.5–15.5)
RDW: 21.1 % — ABNORMAL HIGH (ref 11.5–15.5)
WBC: 5 10*3/uL (ref 4.0–10.5)
WBC: 5.2 10*3/uL (ref 4.0–10.5)
nRBC: 0 % (ref 0.0–0.2)
nRBC: 0 % (ref 0.0–0.2)

## 2021-09-14 LAB — COMPREHENSIVE METABOLIC PANEL
ALT: 14 U/L (ref 0–44)
AST: 33 U/L (ref 15–41)
Albumin: 2.5 g/dL — ABNORMAL LOW (ref 3.5–5.0)
Alkaline Phosphatase: 87 U/L (ref 38–126)
Anion gap: 17 — ABNORMAL HIGH (ref 5–15)
BUN: 62 mg/dL — ABNORMAL HIGH (ref 8–23)
CO2: 23 mmol/L (ref 22–32)
Calcium: 8.8 mg/dL — ABNORMAL LOW (ref 8.9–10.3)
Chloride: 98 mmol/L (ref 98–111)
Creatinine, Ser: 10.29 mg/dL — ABNORMAL HIGH (ref 0.61–1.24)
GFR, Estimated: 5 mL/min — ABNORMAL LOW (ref >60)
Glucose, Bld: 124 mg/dL — ABNORMAL HIGH (ref 70–99)
Potassium: 4.3 mmol/L (ref 3.5–5.1)
Sodium: 138 mmol/L (ref 135–145)
Total Bilirubin: 1.2 mg/dL (ref 0.3–1.2)
Total Protein: 7.4 g/dL (ref 6.5–8.1)

## 2021-09-14 LAB — PREPARE RBC (CROSSMATCH)

## 2021-09-14 LAB — HEPATITIS B SURFACE ANTIGEN: Hepatitis B Surface Ag: NONREACTIVE

## 2021-09-14 LAB — HEPATITIS B SURFACE ANTIBODY,QUALITATIVE: Hep B S Ab: REACTIVE — AB

## 2021-09-14 LAB — PROTIME-INR
INR: 1.1 (ref 0.8–1.2)
Prothrombin Time: 13.6 seconds (ref 11.4–15.2)

## 2021-09-14 MED ORDER — DESMOPRESSIN ACETATE 4 MCG/ML IJ SOLN
30.0000 ug | Freq: Once | INTRAMUSCULAR | Status: DC
  Filled 2021-09-14: qty 10

## 2021-09-14 MED ORDER — ASPIRIN 81 MG PO TBEC
81.0000 mg | DELAYED_RELEASE_TABLET | Freq: Every day | ORAL | Status: DC
  Administered 2021-09-16 – 2021-09-18 (×3): 81 mg via ORAL
  Filled 2021-09-14 (×3): qty 1

## 2021-09-14 MED ORDER — PANTOPRAZOLE SODIUM 40 MG IV SOLR
40.0000 mg | Freq: Once | INTRAVENOUS | Status: AC
  Administered 2021-09-14: 40 mg via INTRAVENOUS
  Filled 2021-09-14: qty 10

## 2021-09-14 MED ORDER — SODIUM CHLORIDE 0.9 % IV SOLN
10.0000 mL/h | Freq: Once | INTRAVENOUS | Status: AC
  Administered 2021-09-14: 10 mL/h via INTRAVENOUS

## 2021-09-14 MED ORDER — GABAPENTIN 100 MG PO CAPS
100.0000 mg | ORAL_CAPSULE | Freq: Three times a day (TID) | ORAL | Status: DC
  Administered 2021-09-14 – 2021-09-18 (×9): 100 mg via ORAL
  Filled 2021-09-14 (×10): qty 1

## 2021-09-14 MED ORDER — HYDROCODONE-ACETAMINOPHEN 5-325 MG PO TABS
1.0000 | ORAL_TABLET | ORAL | Status: DC | PRN
  Administered 2021-09-14 – 2021-09-17 (×8): 1 via ORAL
  Filled 2021-09-14 (×8): qty 1

## 2021-09-14 MED ORDER — FUROSEMIDE 10 MG/ML IJ SOLN: 80.0000 mg | Freq: Once | INTRAMUSCULAR | Status: DC

## 2021-09-14 MED ORDER — ATORVASTATIN CALCIUM 40 MG PO TABS
40.0000 mg | ORAL_TABLET | Freq: Every day | ORAL | Status: DC
  Administered 2021-09-16 – 2021-09-18 (×3): 40 mg via ORAL
  Filled 2021-09-14 (×3): qty 1

## 2021-09-14 MED ORDER — PANTOPRAZOLE SODIUM 40 MG IV SOLR
40.0000 mg | Freq: Two times a day (BID) | INTRAVENOUS | Status: DC
  Administered 2021-09-14 – 2021-09-18 (×7): 40 mg via INTRAVENOUS
  Filled 2021-09-14 (×7): qty 10

## 2021-09-14 MED ORDER — HYDRALAZINE HCL 25 MG PO TABS: 25.0000 mg | ORAL_TABLET | Freq: Four times a day (QID) | ORAL | Status: DC | PRN

## 2021-09-14 MED ORDER — TRAZODONE HCL 50 MG PO TABS
25.0000 mg | ORAL_TABLET | Freq: Every evening | ORAL | Status: DC | PRN
  Administered 2021-09-16 – 2021-09-17 (×3): 50 mg via ORAL
  Filled 2021-09-14 (×3): qty 1

## 2021-09-14 MED ORDER — CALCIUM ACETATE (PHOS BINDER) 667 MG/5ML PO SOLN
667.0000 mg | Freq: Three times a day (TID) | ORAL | Status: DC
  Administered 2021-09-15 – 2021-09-18 (×6): 667 mg via ORAL
  Filled 2021-09-14 (×12): qty 5

## 2021-09-14 MED ORDER — CHLORHEXIDINE GLUCONATE CLOTH 2 % EX PADS
6.0000 | MEDICATED_PAD | Freq: Every day | CUTANEOUS | Status: DC
  Administered 2021-09-15: 6 via TOPICAL

## 2021-09-14 MED ORDER — METHOCARBAMOL 500 MG PO TABS
500.0000 mg | ORAL_TABLET | Freq: Four times a day (QID) | ORAL | Status: DC | PRN
  Administered 2021-09-14 – 2021-09-17 (×5): 500 mg via ORAL
  Filled 2021-09-14 (×5): qty 1

## 2021-09-14 NOTE — Consult Note (Signed)
Consultation  Referring Provider: Dr. Sherry Ruffing   Primary Care Physician:  [name], NP Primary Gastroenterologist: Althia Forts       Reason for Consultation: GI bleeding             HPI:   Daniel [name] is a 61 y.o. male with a past medical history significant for hypertension, diabetes, PAD status post right SFA stenting 3/23 on Plavix, intracerebral hemorrhage, ESRD on dialysis, chronic anemia, diverticulosis and recurrent GI bleeds/duodenal ulcer/small bowel AVMs as well as erosive esophagitis and hyperplastic duodenal polyps, who he presented to the hospital this morning with GI bleeding.    Today, the patient explains that he has never stopped seeing melenic stool since being in the hospital last at the end of April.  He has also continued with a back pain.    He does ask if there is anything we can do about his left leg swelling as he is awaiting a fit for a prosthetic limb and they cannot do it when it swollen.    Denies fever, chills or weight loss.  ER course: Hemoglobin 5.9 (7.1 on 08/30/2021), report of melena      GI History: See patient's chart for extensive review but in short has a history of recurrent GI bleeding is undergone multiple endoscopic procedures.  He was hospitalized last on 08/23/2021 after previously undergoing a small bowel enteroscopy 4/11 which showed mildly severe esophagitis without bleeding, possibly pill esophagitis, friable gastric mucosa and multiple friable duodenal polyps which were biopsied and hyperplastic.  Previous GI procedures: Pill capsule endoscopy 08/25/21 With abdominal polyps, no overt cause for bleeding symptoms noted, small red spot versus diminutive AVM in the bowl but no significant heme anywhere, dark green liquid secretions and stool throughout the bowel, nondiagnostic exam  Small bowel enteroscopy 08/24/2021 Examined portion of the jejunum normal, distal extent of the exam tattooed, few duodenal polyps, grade 1  esophageal varices, portal hypertensive gastropathy; plan to continue to hold Plavix, change Pantoprazole to 40 mg twice daily, continue Aspirin, possible bleeding from portal gastropathy, schedule VCE  Small bowel enteroscopy  08/06/2021: - Mildly severe esophagitis with no bleeding. Biopsied. Given localized circumferential inflammation, suspect pill esophagitis. - Gastritis. - Friable gastric mucosa. Biopsied. - Multiple duodenal polyps. Biopsied. These are most likely reactive/hyperplastic, not adenomatous. These polyps were friable with contact oozing, and thus may be a source of GI bleeding. No AVMs were seen on this examination. - The examined portion of the jejunum was normal.   FINAL MICROSCOPIC DIAGNOSIS:   A. DUODENUM, POLYPECTOMY:  Hyperplastic polyps with nonspecific inflammation in the stroma.  There are no diagnostic features of celiac disease.  Negative for adenomatous change, dysplasia and malignancy.   B. PYLORIS, POLYPECTOMY:  Benign hyperplastic gastric polyp with focal erosion of the surface  epithelium.  H. pylori are not identified.  Negative for dysplasia and malignancy.   C. ESOPHAGUS, BIOPSY:  Acute erosive esophagitis.  Microorganisms with morphologic features consistent with Candida species  are present.  Negative for dysplasia and malignancy.    Small bowel enteroscopy by Dr. Benson Norway 07/05/2021: - Normal esophagus. - Friable gastric mucosa. - A few recently bleeding angioectasias in the duodenum. Treated with a monopolar probe. - A single non-bleeding angiodysplastic lesion in the jejunum. Treated with a monopolar probe. - No specimens collected.   Flexible sigmoidoscopy 07/05/2021: - Blood in the rectum, in the recto-sigmoid colon and in the sigmoid colon. - No specimens collected. -Abdominal/pelvic CT angiogram was  recommended if he developed a rapid lower GI bleeding.   Small bowel enteroscopy by Dr. Bryan Lemma 03/26/2021: -Normal esophagus.  The  previously noted esophagitis dissecans has since healed. -Mild, nonulcerative gastritis.  Biopsied. -Mild inflammation with nodularity in the duodenal bulb.  This overall appeared improved from images on the prior studies. -2 nonbleeding angiectasia's in the duodenum.  Treated with argon plasma coagulation.   EGD 08/10/2020: - Normal esophagus. - Gastritis. - Bleeding duodenal - Bleeding duodenal ulcer with a visible vessel. Clips were placed x 3 with apparent hemostasis. - Duodenitis with nodularity and polypoid mucosa. - No specimens collected.   Small bowel enteroscopy 08/02/2019: - Esophagogastric landmarks identified. - Normal esophagus. - Normal stomach. - Prominent ampulla without overt adenomatous changes. Biopsied. - A few benign appearing duodenal polyps. Biopsied, one suspected AVM treated as above. 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 Treated with argon plasma coagulation (APC). - A single non-bleeding angiodysplastic lesion in the proximal jejunum. Treated with argon plasma coagulation   EGD 06/30/2019: - No source of iron deficiency anemia or heme positive stoIonlp eantideonstcopically evident. - Normal larynx. - Normal esophagus. - Normal stomach. - Duodenal polyp vs. prominent papilla, benign-appearing. Biopsied.   Colonoscopy 06/30/2019: - No active bleeding or source of heme positive stool/anemia seen. - Diverticulosis in the sigmoid colon. - Probable old blood in the terminal ileum, raising the question of a small bowel source (given negative egd). - The distal rectum and anal verge are normal on retroflexion view. - No specimens collected.  Past Medical History:  Diagnosis Date   Anemia    Diabetes mellitus without complication [facility])    patient denies   Dialysis patient [facility])    End stage chronic kidney disease ([place])    Hypertension    ICH (intracerebral hemorrhage) ([place]) 05/20/2017   PAD (peripheral artery disease) ([facility])     Shoulder pain, left 06/28/2013    Past Surgical History:  Procedure Laterality Date   A/V FISTULAGRAM N/A 08/15/2020   Procedure: A/V FISTULAGRAM - Left Upper;  Surgeon: Cherre Robins, MD;  Location: [place] [facility];  Service: Cardiovascular;  Laterality: N/A;   ABDOMINAL AORTOGRAM W/LOWER EXTREMITY N/A 04/08/2021   Procedure: ABDOMINAL AORTOGRAM W/LOWER EXTREMITY;  Surgeon: Waynetta Sandy, MD;  Location: [place] [facility];  Service: Cardiovascular;  Laterality: N/A;   AMPUTATION Left 04/16/2021   Procedure: LEFT BELOW KNEE AMPUTATION;  Surgeon: Angelia Mould, MD;  Location: [place];  Service: Vascular;  Laterality: Left;   AMPUTATION Left 06/17/2021   Procedure: REVISION AMPUTATION BELOW KNEE;  Surgeon: Broadus John, MD;  Location: [place];  Service: Vascular;  Laterality: Left;   APPENDECTOMY     APPLICATION OF WOUND VAC Left 06/17/2021   Procedure: APPLICATION OF WOUND VAC;  Surgeon: Broadus John, MD;  Location: [place];  Service: Vascular;  Laterality: Left;   AV FISTULA PLACEMENT Left 08/03/2019   Procedure: LEFT ARM ARTERIOVENOUS (AV) CEPHALIC  FISTULA CREATION;  Surgeon: Waynetta Sandy, MD;  Location: [place];  Service: Vascular;  Laterality: Left;   BIOPSY  06/30/2019   Procedure: BIOPSY;  Surgeon: Ronald Lobo, MD;  Location: [place];  Service: Endoscopy;;   BIOPSY  08/02/2019   Procedure: BIOPSY;  Surgeon: Yetta Flock, MD;  Location: [facility] ENDOSCOPY;  Service: Gastroenterology;;   BIOPSY  02/08/2021   Procedure: BIOPSY;  Surgeon: Sharyn Creamer, MD;  Location: [place];  Service: Gastroenterology;;   BIOPSY  02/24/2021  Procedure: BIOPSY;  Surgeon: [name], MD;  Location: [facility] ENDOSCOPY;  Service: Gastroenterology;;   BIOPSY  03/26/2021   Procedure: BIOPSY;  Surgeon: Lavena Bullion, DO;  Location: [place];  Service: Gastroenterology;;   BIOPSY  08/06/2021   Procedure: BIOPSY;  Surgeon: [name], MD;   Location: [place];  Service: Gastroenterology;;   COLONOSCOPY  01/23/2012   Procedure: COLONOSCOPY;  Surgeon: Danie Binder, MD;  [facility];  Service: Endoscopy;  Laterality: N/A;  11:10 AM   COLONOSCOPY WITH PROPOFOL N/A 06/30/2019   Procedure: COLONOSCOPY WITH PROPOFOL;  Surgeon: Ronald Lobo, MD;  Location: [place];  Service: Endoscopy;  Laterality: N/A;   ENTEROSCOPY N/A 08/02/2019   Procedure: ENTEROSCOPY;  Surgeon: Yetta Flock, MD;  Location: [facility] ENDOSCOPY;  Service: Gastroenterology;  Laterality: N/A;   ENTEROSCOPY N/A 02/24/2021   Procedure: ENTEROSCOPY;  Surgeon: [name], MD;  Location: [facility] ENDOSCOPY;  Service: Gastroenterology;  Laterality: N/A;   ENTEROSCOPY N/A 03/26/2021   Procedure: ENTEROSCOPY;  Surgeon: Lavena Bullion, DO;  Location: [place];  Service: Gastroenterology;  Laterality: N/A;   ENTEROSCOPY N/A 07/05/2021   Procedure: ENTEROSCOPY;  Surgeon: Carol Ada, MD;  Location: [place];  Service: Gastroenterology;  Laterality: N/A;   ENTEROSCOPY N/A 08/06/2021   Procedure: ENTEROSCOPY;  Surgeon: [name], MD;  Location: [facility] ENDOSCOPY;  Service: Gastroenterology;  Laterality: N/A;   ENTEROSCOPY N/A 08/24/2021   Procedure: ENTEROSCOPY;  Surgeon: Ladene Artist, MD;  Location: [facility] ENDOSCOPY;  Service: Gastroenterology;  Laterality: N/A;   ESOPHAGOGASTRODUODENOSCOPY N/A 08/10/2020   Procedure: ESOPHAGOGASTRODUODENOSCOPY (EGD);  Surgeon: Jerene Bears, MD;  Location: [facility] ENDOSCOPY;  Service: Gastroenterology;  Laterality: N/A;   ESOPHAGOGASTRODUODENOSCOPY (EGD) WITH PROPOFOL N/A 06/30/2019   Procedure: ESOPHAGOGASTRODUODENOSCOPY (EGD) WITH PROPOFOL;  Surgeon: Ronald Lobo, MD;  Location: [place];  Service: Endoscopy;  Laterality: N/A;   ESOPHAGOGASTRODUODENOSCOPY (EGD) WITH PROPOFOL N/A 01/12/2021   Procedure: ESOPHAGOGASTRODUODENOSCOPY (EGD) WITH PROPOFOL;  Surgeon: Sharyn Creamer, MD;  Location: [place];   Service: Gastroenterology;  Laterality: N/A;   ESOPHAGOGASTRODUODENOSCOPY (EGD) WITH PROPOFOL N/A 02/08/2021   Procedure: ESOPHAGOGASTRODUODENOSCOPY (EGD) WITH PROPOFOL;  Surgeon: Sharyn Creamer, MD;  Location: [facility];  Service: Gastroenterology;  Laterality: N/A;   FISTULA SUPERFICIALIZATION Left 10/17/2019   Procedure: LEFT UPPER EXTREMITY FISTULA REVISION, SIDE BRANCH LIGATION,  AND SUPERFICIALIZATION;  Surgeon: Marty Heck, MD;  Location: [place];  Service: Vascular;  Laterality: Left;   FISTULA SUPERFICIALIZATION Left 83/38/2505   Procedure: PLICATION OF ANEURYSM LEFT ARTERIOVENOUS FISTULA;  Surgeon: Angelia Mould, MD;  Location: [place];  Service: Vascular;  Laterality: Left;   FLEXIBLE SIGMOIDOSCOPY N/A 07/05/2021   Procedure: FLEXIBLE SIGMOIDOSCOPY;  Surgeon: Carol Ada, MD;  Location: [place];  Service: Gastroenterology;  Laterality: N/A;   GIVENS CAPSULE STUDY N/A 06/30/2019   Procedure: GIVENS CAPSULE STUDY;  Surgeon: Ronald Lobo, MD;  Location: [place];  Service: Endoscopy;  Laterality: N/A;   GIVENS CAPSULE STUDY N/A 08/25/2021   Procedure: GIVENS CAPSULE STUDY;  Surgeon: Ladene Artist, MD;  Location: [place];  Service: Gastroenterology;  Laterality: N/A;   HEMOSTASIS CLIP PLACEMENT  08/10/2020   Procedure: HEMOSTASIS CLIP PLACEMENT;  Surgeon: Jerene Bears, MD;  Location: [place];  Service: Gastroenterology;;   HEMOSTASIS CLIP PLACEMENT  01/12/2021   Procedure: HEMOSTASIS CLIP PLACEMENT;  Surgeon: Sharyn Creamer, MD;  Location: [place];  Service: Gastroenterology;;   HEMOSTASIS CONTROL  08/02/2019   Procedure: HEMOSTASIS CONTROL;  Surgeon:  Yetta Flock, MD;  Location: [facility] ENDOSCOPY;  Service: Gastroenterology;;   HOT HEMOSTASIS  02/24/2021   Procedure: HOT HEMOSTASIS (ARGON PLASMA COAGULATION/BICAP);  Surgeon: [name], MD;  Location: [facility] ENDOSCOPY;  Service: Gastroenterology;;   HOT HEMOSTASIS N/A 03/26/2021    Procedure: HOT HEMOSTASIS (ARGON PLASMA COAGULATION/BICAP);  Surgeon: Lavena Bullion, DO;  Location: [facility] ENDOSCOPY;  Service: Gastroenterology;  Laterality: N/A;   HOT HEMOSTASIS N/A 07/05/2021   Procedure: HOT HEMOSTASIS (ARGON PLASMA COAGULATION/BICAP);  Surgeon: Carol Ada, MD;  Location: [place];  Service: Gastroenterology;  Laterality: N/A;   INCISION AND DRAINAGE ABSCESS N/A 06/29/2016   Procedure: INCISION AND DRAINAGE ABDOMINAL WALL ABSCESS;  Surgeon: Alphonsa Overall, MD;  Location: [facility];  Service: General;  Laterality: N/A;   INSERTION OF DIALYSIS CATHETER Right 08/03/2019   Procedure: INSERTION OF DIALYSIS CATHETER;  Surgeon: Waynetta Sandy, MD;  Location: [place];  Service: Vascular;  Laterality: Right;   INSERTION OF DIALYSIS CATHETER Right 10/22/2019   Procedure: INSERTION OF 23CM TUNNELED DIALYSIS CATHETER RIGHT INTERNAL JUGULAR;  Surgeon: Angelia Mould, MD;  Location: [place];  Service: Vascular;  Laterality: Right;   INSERTION OF DIALYSIS CATHETER Right 08/12/2020   Procedure: INSERTION OF Right internal Jugular TUNNELED  DIALYSIS CATHETER.;  Surgeon: Waynetta Sandy, MD;  Location: [place];  Service: Vascular;  Laterality: Right;   INSERTION OF DIALYSIS CATHETER N/A 04/19/2021   Procedure: INSERTION OF TUNNELED DIALYSIS CATHETER;  Surgeon: Angelia Mould, MD;  [facility];  Service: Vascular;  Laterality: N/A;   IR RADIOLOGIST EVAL & MGMT  09/12/2021   Left heel surgery     LOWER EXTREMITY ANGIOGRAPHY N/A 07/08/2021   Procedure: Lower Extremity Angiography;  Surgeon: Cherre Robins, MD;  Location: [place] [facility];  Service: Cardiovascular;  Laterality: N/A;   PENILE BIOPSY N/A 03/26/2020   Procedure: PENILE ULCER DEBRIDEMENT;  Surgeon: Remi Haggard, MD;  Location: [facility];  Service: Urology;  Laterality: N/A;  30 MINS   PERIPHERAL VASCULAR INTERVENTION Right 07/08/2021   Procedure: PERIPHERAL VASCULAR INTERVENTION;  Surgeon: Cherre Robins, MD;  Location: [place] [facility];  Service: Cardiovascular;  Laterality: Right;   SCLEROTHERAPY  01/12/2021   Procedure: SCLEROTHERAPY;  Surgeon: Sharyn Creamer, MD;  Location: [place];  Service: Gastroenterology;;   Clide Deutscher  02/24/2021   Procedure: Clide Deutscher;  Surgeon: [name], MD;  Location: [facility] ENDOSCOPY;  Service: Gastroenterology;;   SUBMUCOSAL TATTOO INJECTION  08/24/2021   Procedure: SUBMUCOSAL TATTOO INJECTION;  Surgeon: Ladene Artist, MD;  Location: [facility] ENDOSCOPY;  Service: Gastroenterology;;    Family History  Problem Relation Age of Onset   Colon cancer Neg Hx     Social History   Tobacco Use   Smoking status: Every Day    Packs/day: 0.50    Years: 45.00    Pack years: 22.50    Types: Cigarettes   Smokeless tobacco: Never   Tobacco comments:    Pt left before information given  Vaping Use   Vaping Use: Never used  Substance Use Topics   Alcohol use: Not Currently   Drug use: Not Currently    Types: "Crack" cocaine    Comment: last in 2020    Prior to Admission medications   Medication Sig Start Date End Date Taking? Authorizing Provider  amLODipine (NORVASC) 2.5 MG tablet Take 1 tablet (2.5 mg total) by mouth at bedtime. Patient taking differently: Take 2.5 mg by mouth daily. 08/10/21  Bonnielee Haff, MD  aspirin 81 MG EC tablet Take 1 tablet (81 mg total) by mouth daily. Swallow whole. 08/10/21   Bonnielee Haff, MD  atorvastatin (LIPITOR) 40 MG tablet Take 1 tablet (40 mg total) by mouth daily. 08/20/21   Baglia, Corrina, PA-C  calcium acetate, Phos Binder, (PHOSLYRA) 667 MG/5ML SOLN Take 667 mg by mouth 3 (three) times daily with meals.    [provider]  Darbepoetin Alfa (ARANESP) 100 MCG/0.5ML SOSY injection Inject 0.5 mLs (100 mcg total) into the vein every 7 (seven) days for 8 doses. 08/19/21 10/08/21  Bonnell Public, MD  gabapentin (NEURONTIN) 100 MG capsule Take 1 capsule (100 mg total) by mouth 3 (three)  times daily. 08/10/21   Bonnielee Haff, MD  HYDROcodone-acetaminophen (NORCO/VICODIN) 5-325 MG tablet Take 1 tablet by mouth every 4 (four) hours as needed for severe pain. 08/27/21   Bonnielee Haff, MD  methocarbamol (ROBAXIN) 500 MG tablet Take 1 tablet (500 mg total) by mouth every 6 (six) hours as needed for muscle spasms. 08/27/21   Bonnielee Haff, MD  pantoprazole (PROTONIX) 40 MG tablet Take 1 tablet (40 mg total) by mouth 2 (two) times daily before a meal. 08/27/21   Bonnielee Haff, MD  traZODone (DESYREL) 50 MG tablet Take 0.5-1 tablets (25-50 mg total) by mouth at bedtime as needed for sleep. 09/03/21   [name], NP  colchicine 0.6 MG tablet Take 0.5 tablets (0.3 mg total) by mouth 2 (two) times daily. 07/24/20 07/29/20  Noemi Chapel, MD  furosemide (LASIX) 40 MG tablet Take 1 tablet (40 mg total) by mouth daily. 07/25/19 02/09/20  Ladona Horns, MD    No current facility-administered medications for this encounter.   Current Outpatient Medications  Medication Sig Dispense Refill   amLODipine (NORVASC) 2.5 MG tablet Take 1 tablet (2.5 mg total) by mouth at bedtime. (Patient taking differently: Take 2.5 mg by mouth daily.) 30 tablet 1   aspirin 81 MG EC tablet Take 1 tablet (81 mg total) by mouth daily. Swallow whole. 30 tablet 2   atorvastatin (LIPITOR) 40 MG tablet Take 1 tablet (40 mg total) by mouth daily. 30 tablet 11   calcium acetate, Phos Binder, (PHOSLYRA) 667 MG/5ML SOLN Take 667 mg by mouth 3 (three) times daily with meals.     Darbepoetin Alfa (ARANESP) 100 MCG/0.5ML SOSY injection Inject 0.5 mLs (100 mcg total) into the vein every 7 (seven) days for 8 doses. 4 mL 0   gabapentin (NEURONTIN) 100 MG capsule Take 1 capsule (100 mg total) by mouth 3 (three) times daily. 90 capsule 3   HYDROcodone-acetaminophen (NORCO/VICODIN) 5-325 MG tablet Take 1 tablet by mouth every 4 (four) hours as needed for severe pain. 30 tablet 0   methocarbamol (ROBAXIN) 500 MG tablet Take 1 tablet  (500 mg total) by mouth every 6 (six) hours as needed for muscle spasms. 30 tablet 0   pantoprazole (PROTONIX) 40 MG tablet Take 1 tablet (40 mg total) by mouth 2 (two) times daily before a meal. 60 tablet 2   traZODone (DESYREL) 50 MG tablet Take 0.5-1 tablets (25-50 mg total) by mouth at bedtime as needed for sleep. 30 tablet 3    Allergies as of 09/14/2021 - Review Complete 09/05/2021  Allergen Reaction Noted   Seroquel [quetiapine] Other (See Comments) 04/22/2021   Dilaudid [hydromorphone hcl] Itching and Other (See Comments) 07/29/2020     Review of Systems:    Constitutional: No weight loss, fever or chills Skin: No rash Cardiovascular:  No chest pain Respiratory: No SOB  Gastrointestinal: See HPI and otherwise negative Genitourinary: No dysuria Neurological: No headache, dizziness or syncope Musculoskeletal: No new muscle or joint pain Hematologic: No bruising Psychiatric: No history of depression or anxiety    Physical Exam:  Vital signs in last 24 hours: Temp:  [98.1 F (36.7 C)] 98.1 F (36.7 C) (05/20 1041) Pulse Rate:  [88-90] 90 (05/20 1200) Resp:  [16-18] 16 (05/20 1200) BP: (141-153)/(92-94) 153/94 (05/20 1200) SpO2:  [97 %-98 %] 98 % (05/20 1200)   General:   Pleasant AA male appears to be in NAD, Well developed, Well nourished, alert and cooperative Head:  Normocephalic and atraumatic. Eyes:   PEERL, EOMI. No icterus. Conjunctiva pink. Ears:  Normal auditory acuity. Neck:  Supple Throat: Oral cavity and pharynx without inflammation, swelling or lesion. Teeth in good condition. Lungs: Respirations even and unlabored. Lungs clear to auscultation bilaterally.   No wheezes, crackles, or rhonchi.  Heart: Normal S1, S2. No MRG. Regular rate and rhythm. No peripheral edema, cyanosis or pallor.  Abdomen:  Soft, nondistended, nontender. No rebound or guarding. Normal bowel sounds. No appreciable masses or hepatomegaly. Rectal:  Not performed.  Msk:  Symmetrical  without gross deformities. Peripheral pulses intact.  Extremities: BKA on the left with some swelling Neurologic:  Alert and  oriented x4;  grossly normal neurologically. Skin:   Dry and intact without significant lesions or rashes. Psychiatric: Demonstrates good judgement and reason without abnormal affect or behaviors.   LAB RESULTS: Recent Labs    09/14/21 1049  WBC 5.0  HGB 5.9*  HCT 18.2*  PLT 131*   BMET Recent Labs    09/14/21 1049  NA 138  K 4.3  CL 98  CO2 23  GLUCOSE 124*  BUN 62*  CREATININE 10.29*  CALCIUM 8.8*   LFT Recent Labs    09/14/21 1049  PROT 7.4  ALBUMIN 2.5*  AST 33  ALT 14  ALKPHOS 87  BILITOT 1.2    STUDIES: IR Radiologist Eval & Mgmt  Result Date: 09/12/2021 Please refer to notes tab for details about interventional procedure. (Op Note)    Impression / Plan:   Impression: 1.  Recurrent GI bleeding with acute on chronic anemia: 08/24/2021 small bowel enteroscopy with a few duodenal polyps and grade 1 esophageal varices as well as portal hypertensive gastropathy, Bleeding is possibly from this, also Dr. Hilarie Fredrickson capsule endoscopy 4/30 with no overt cause for bleeding seen, patient has continued melenic stools with hemoglobin dropped recently now down to 5.9; likely upper GI bleed 2.  Erosive esophagitis: History of this in the past 3.  Cirrhosis with portal hypertension and recent findings of [place] on MRI: Currently undergoing chemotherapy 4.  ESRD on HD MWF: He has missed a couple dialysis treatments and nephrology has been consulted   Plan: 1.  Plan for repeat enteroscopy tomorrow with Dr. Lorenso Courier.  Did discuss risks, benefits, limitations and alternatives and patient agrees to proceed. 2.  Patient be on clear liquid diet today and n.p.o. at midnight 3.  Continue to monitor hemoglobin with transfusion as needed less than 7 4.  Continue other supportive measures   Thank you for your kind consultation, we will continue to  follow.  Lavone Nian Srinivas Lippman  09/14/2021, 12:21 PM

## 2021-09-14 NOTE — ED Provider Notes (Signed)
[facility] EMERGENCY DEPARTMENT Provider Note   CSN: [id number] Arrival date & time: 09/14/21  1034     History  Chief Complaint  Patient presents with   Back Pain   Melena    Daniel [name] is a 61 y.o. male.  The history is provided by the patient and medical records. No language interpreter was used.  Back Pain Location:  Thoracic spine Quality:  Aching Onset quality:  Gradual Progression:  Unchanged Chronicity:  Chronic Relieved by:  Nothing Worsened by:  Palpation Associated symptoms: no abdominal pain, no chest pain, no dysuria, no fever, no headaches, no leg pain, no numbness, no tingling and no weakness       Home Medications Prior to Admission medications   Medication Sig Start Date End Date Taking? Authorizing Provider  amLODipine (NORVASC) 2.5 MG tablet Take 1 tablet (2.5 mg total) by mouth at bedtime. Patient taking differently: Take 2.5 mg by mouth daily. 08/10/21   Bonnielee Haff, MD  aspirin 81 MG EC tablet Take 1 tablet (81 mg total) by mouth daily. Swallow whole. 08/10/21   Bonnielee Haff, MD  atorvastatin (LIPITOR) 40 MG tablet Take 1 tablet (40 mg total) by mouth daily. 08/20/21   Baglia, Corrina, PA-C  calcium acetate, Phos Binder, (PHOSLYRA) 667 MG/5ML SOLN Take 667 mg by mouth 3 (three) times daily with meals.    [provider]  Darbepoetin Alfa (ARANESP) 100 MCG/0.5ML SOSY injection Inject 0.5 mLs (100 mcg total) into the vein every 7 (seven) days for 8 doses. 08/19/21 10/08/21  Bonnell Public, MD  gabapentin (NEURONTIN) 100 MG capsule Take 1 capsule (100 mg total) by mouth 3 (three) times daily. 08/10/21   Bonnielee Haff, MD  HYDROcodone-acetaminophen (NORCO/VICODIN) 5-325 MG tablet Take 1 tablet by mouth every 4 (four) hours as needed for severe pain. 08/27/21   Bonnielee Haff, MD  methocarbamol (ROBAXIN) 500 MG tablet Take 1 tablet (500 mg total) by mouth every 6 (six) hours as needed for muscle spasms. 08/27/21    Bonnielee Haff, MD  pantoprazole (PROTONIX) 40 MG tablet Take 1 tablet (40 mg total) by mouth 2 (two) times daily before a meal. 08/27/21   Bonnielee Haff, MD  traZODone (DESYREL) 50 MG tablet Take 0.5-1 tablets (25-50 mg total) by mouth at bedtime as needed for sleep. 09/03/21   [name], NP  colchicine 0.6 MG tablet Take 0.5 tablets (0.3 mg total) by mouth 2 (two) times daily. 07/24/20 07/29/20  Noemi Chapel, MD  furosemide (LASIX) 40 MG tablet Take 1 tablet (40 mg total) by mouth daily. 07/25/19 02/09/20  Ladona Horns, MD      Allergies    Seroquel [quetiapine] and Dilaudid [hydromorphone hcl]    Review of Systems   Review of Systems  Constitutional:  Positive for fatigue. Negative for chills, diaphoresis and fever.  HENT:  Negative for congestion.   Respiratory:  Positive for shortness of breath. Negative for cough, chest tightness and wheezing.   Cardiovascular:  Negative for chest pain, palpitations and leg swelling.  Gastrointestinal:  Positive for blood in stool. Negative for abdominal pain, constipation, diarrhea, nausea and vomiting.  Genitourinary:  Negative for dysuria, flank pain and frequency.  Musculoskeletal:  Positive for back pain. Negative for neck pain and neck stiffness.  Skin:  Negative for wound.  Neurological:  Positive for light-headedness. Negative for tingling, weakness, numbness and headaches.  Psychiatric/Behavioral:  Negative for agitation and confusion.   All other systems reviewed and are negative.  Physical Exam Updated Vital Signs BP (!) 141/92   Pulse 88   Temp 98.1 F (36.7 C) (Oral)   Resp 18   SpO2 97%  Physical Exam Vitals and nursing note reviewed.  Constitutional:      General: He is not in acute distress.    Appearance: He is well-developed. He is not ill-appearing, toxic-appearing or diaphoretic.  HENT:     Head: Normocephalic and atraumatic.     Mouth/Throat:     Mouth: Mucous membranes are moist.  Eyes:     Extraocular  Movements: Extraocular movements intact.     Conjunctiva/sclera: Conjunctivae normal.     Pupils: Pupils are equal, round, and reactive to light.  Cardiovascular:     Rate and Rhythm: Normal rate and regular rhythm.     Heart sounds: No murmur heard. Pulmonary:     Effort: Pulmonary effort is normal. No respiratory distress.     Breath sounds: Normal breath sounds. No wheezing, rhonchi or rales.  Chest:     Chest wall: No tenderness.  Abdominal:     General: Abdomen is flat.     Palpations: Abdomen is soft.     Tenderness: There is no abdominal tenderness. There is no right CVA tenderness, left CVA tenderness, guarding or rebound.  Musculoskeletal:        General: Tenderness present. No swelling.     Cervical back: Neck supple.     Right lower leg: No edema.     Left lower leg: No edema.  Skin:    General: Skin is warm and dry.     Capillary Refill: Capillary refill takes less than 2 seconds.     Coloration: Skin is pale.     Findings: No erythema or rash.  Neurological:     General: No focal deficit present.     Mental Status: He is alert.  Psychiatric:        Mood and Affect: Mood normal.    ED Results / Procedures / Treatments   Labs (all labs ordered are listed, but only abnormal results are displayed) Labs Reviewed  COMPREHENSIVE METABOLIC PANEL - Abnormal; Notable for the following components:      Result Value   Glucose, Bld 124 (*)    BUN 62 (*)    Creatinine, Ser 10.29 (*)    Calcium 8.8 (*)    Albumin 2.5 (*)    GFR, Estimated 5 (*)    Anion gap 17 (*)    All other components within normal limits  CBC - Abnormal; Notable for the following components:   RBC 1.68 (*)    Hemoglobin 5.9 (*)    HCT 18.2 (*)    MCV 108.3 (*)    MCH 35.1 (*)    RDW 21.2 (*)    Platelets 131 (*)    All other components within normal limits  HEPATITIS B SURFACE ANTIGEN  HEPATITIS B SURFACE ANTIBODY,QUALITATIVE  HEPATITIS B SURFACE ANTIBODY, QUANTITATIVE  TYPE AND SCREEN   PREPARE RBC (CROSSMATCH)    EKG EKG Interpretation  Date/Time:  Saturday Sep 14 2021 10:45:54 EDT Ventricular Rate:  88 PR Interval:  170 QRS Duration: 74 QT Interval:  404 QTC Calculation: 488 R Axis:   38 Text Interpretation: Normal sinus rhythm Anteroseptal infarct , age undetermined Abnormal ECG When compared with ECG of 23-Aug-2021 05:36, PREVIOUS ECG IS PRESENT when compared to prior, similar appearance. No sTEMI Confirmed by Antony Blackbird [id number]) on 09/14/2021 11:10:49 AM  Radiology DG Chest Portable 1  View  Result Date: 09/14/2021 CLINICAL DATA:  Shortness of breath EXAM: PORTABLE CHEST 1 VIEW COMPARISON:  08/16/2021 FINDINGS: Transverse diameter of heart is increased. Central pulmonary vessels are prominent. Increased interstitial markings are seen in the parahilar regions and lower lung fields. There is hazy homogeneous opacity in the right parahilar region. Lateral CP angles are indistinct. There is no pneumothorax. There is vascular stent in the left axilla. IMPRESSION: Cardiomegaly. Central pulmonary vessels are prominent suggesting possible mild CHF. Increased interstitial markings are seen in the parahilar regions and lower lung fields suggesting mild interstitial edema or interstitial pneumonia. There is hazy homogeneous opacity in the right parahilar region which may suggest loculated effusion in the interlobar fissure or subsegmental atelectasis. Electronically Signed   By: Elmer Picker M.D.   On: 09/14/2021 12:52   IR Radiologist Eval & Mgmt  Result Date: 09/12/2021 Please refer to notes tab for details about interventional procedure. (Op Note)   Procedures Procedures    CRITICAL CARE Performed by: Gwenyth Allegra Catheryne Deford Total critical care time: 35 minutes Critical care time was exclusive of separately billable procedures and treating other patients. Critical care was necessary to treat or prevent imminent or life-threatening deterioration. Critical care  was time spent personally by me on the following activities: development of treatment plan with patient and/or surrogate as well as nursing, discussions with consultants, evaluation of patient's response to treatment, examination of patient, obtaining history from patient or surrogate, ordering and performing treatments and interventions, ordering and review of laboratory studies, ordering and review of radiographic studies, pulse oximetry and re-evaluation of patient's condition.   Medications Ordered in ED Medications  Chlorhexidine Gluconate Cloth 2 % PADS 6 each (has no administration in time range)  0.9 %  sodium chloride infusion (10 mL/hr Intravenous New Bag/Given 09/14/21 1205)  pantoprazole (PROTONIX) injection 40 mg (40 mg Intravenous Given 09/14/21 1213)    ED Course/ Medical Decision Making/ A&P                           Medical Decision Making Problems Addressed: Exertional shortness of breath: acute illness or injury that poses a threat to life or bodily functions Gastrointestinal hemorrhage, unspecified gastrointestinal hemorrhage type: acute illness or injury that poses a threat to life or bodily functions  Amount and/or Complexity of Data Reviewed External Data Reviewed: labs and radiology. Labs: ordered. Radiology: ordered. ECG/medicine tests: ordered and independent interpretation performed.  Risk Prescription drug management. Decision regarding hospitalization.    Daniel [name] is a 61 y.o. male with a past medical history significant for ESRD on dialysis MWF, diabetes, hypertension, duodenal ulcers with GI bleeding, hepatocellular carcinoma on infusion therapies, previous QT prolongation, CHF, and left BKA who presents with recurrent dark stools and worsening fatigue, shortness of breath, and chronic back pain.  According to patient, he was discharged in the hospital about 3 weeks ago but for the last week or so he has had recurrent dark stools and is getting  more more tired.  He reports he had exertional shortness of breath but is denying any chest pain.  He also denies any fevers, chills, congestion, or cough.  Denies any chest pain or abdominal pain.  Denies any constipation or diarrhea.  He does still report making urine but denies any dysuria.  He says he missed dialysis on Wednesday and Friday due to feeling so fatigued and.  He denies any new trauma.  He reports that he has recurrent  upper back pain that is similar to during his recent admission when he had further work-up and was seen by neurosurgery to make an outpatient plan.  He is currently concerned about the fatigue and the dark stools recurring.  On exam, lungs were clear and chest was nontender.  Abdomen was nontender.  Back was tender in the mid thoracic area but no skin changes seen.  Patient has left BKA without significant tenderness on my exam.  He is moving other extremities.  He does have pale mucous membranes.  Patient will be worked up for possible life-threatening condition with short of breath and fatigue and concern for recurrent GI bleeding.  Patient had work-up started in triage including labs.  Patient's hemoglobin found to be 5.9.  This is decreased in prior.  I reviewed the patient's previous documentation from his admission including gastroenterology consultation and they request that he be transfused to get his hemoglobin tween 7 and 8.  They did both endoscopy and capsule study which is really nondiagnostic but he does have some polyps and AVM.  Patient was seen by [facility] at that time.  Given the patient's fatigue, continued dark stools, and the worsened exertional shortness of breath, I am concerned about symptomatic anemia causing these recurrent symptoms.  We will call gastroenterology.  We will also call nephrology as he has missed dialysis 2 episodes with the shortness of breath.  We will get chest x-ray however EKG did not show evidence of STEMI.  Anticipate  admission after speaking with consultants for further management of his recurrent symptomatic anemia from GI bleed  12:06 PM Just spoke to consult with [facility] who will see patient in consultation.  They agree with IV Protonix for the likely recurrent upper GI bleed, blood transfusion, and they will see patient during admission.  They agree with medicine admission.  Due to the patient's missing 2 dialysis treatments on Wednesday and Friday respectively due to feeling fatigued, will call nephrology to see if he needs dialysis over the weekend.  We will call for admission for recurrent symptomatic anemia from GI bleed.         Final Clinical Impression(s) / ED Diagnoses Final diagnoses:  Symptomatic anemia  Gastrointestinal hemorrhage, unspecified gastrointestinal hemorrhage type  Fatigue, unspecified type  Exertional shortness of breath     Clinical Impression: 1. Symptomatic anemia   2. Gastrointestinal hemorrhage, unspecified gastrointestinal hemorrhage type   3. Fatigue, unspecified type   4. Exertional shortness of breath     Disposition: Admit  This note was prepared with assistance of Dragon voice recognition software. Occasional wrong-word or sound-a-like substitutions may have occurred due to the inherent limitations of voice recognition software.     Zenith Lamphier, Gwenyth Allegra, MD 09/14/21 [phone]

## 2021-09-14 NOTE — ED Triage Notes (Signed)
Pt reports upper back pain x 2 weeks.  Also reports continued blood in stool.  States he has been seen in ED 3-4 times for same.  States stools are black.  Denies abd pain.

## 2021-09-14 NOTE — H&P (View-Only) (Signed)
Consultation  Referring Provider: Dr. Sherry Ruffing   Primary Care Physician:  [name], NP Primary Gastroenterologist: Althia Forts       Reason for Consultation: GI bleeding             HPI:   Daniel [name] is a 61 y.o. male with a past medical history significant for hypertension, diabetes, PAD status post right SFA stenting 3/23 on Plavix, intracerebral hemorrhage, ESRD on dialysis, chronic anemia, diverticulosis and recurrent GI bleeds/duodenal ulcer/small bowel AVMs as well as erosive esophagitis and hyperplastic duodenal polyps, who he presented to the hospital this morning with GI bleeding.    Today, the patient explains that he has never stopped seeing melenic stool since being in the hospital last at the end of April.  He has also continued with a back pain.    He does ask if there is anything we can do about his left leg swelling as he is awaiting a fit for a prosthetic limb and they cannot do it when it swollen.    Denies fever, chills or weight loss.  ER course: Hemoglobin 5.9 (7.1 on 08/30/2021), report of melena      GI History: See patient's chart for extensive review but in short has a history of recurrent GI bleeding is undergone multiple endoscopic procedures.  He was hospitalized last on 08/23/2021 after previously undergoing a small bowel enteroscopy 4/11 which showed mildly severe esophagitis without bleeding, possibly pill esophagitis, friable gastric mucosa and multiple friable duodenal polyps which were biopsied and hyperplastic.  Previous GI procedures: Pill capsule endoscopy 08/25/21 With abdominal polyps, no overt cause for bleeding symptoms noted, small red spot versus diminutive AVM in the bowl but no significant heme anywhere, dark green liquid secretions and stool throughout the bowel, nondiagnostic exam  Small bowel enteroscopy 08/24/2021 Examined portion of the jejunum normal, distal extent of the exam tattooed, few duodenal polyps, grade 1  esophageal varices, portal hypertensive gastropathy; plan to continue to hold Plavix, change Pantoprazole to 40 mg twice daily, continue Aspirin, possible bleeding from portal gastropathy, schedule VCE  Small bowel enteroscopy  08/06/2021: - Mildly severe esophagitis with no bleeding. Biopsied. Given localized circumferential inflammation, suspect pill esophagitis. - Gastritis. - Friable gastric mucosa. Biopsied. - Multiple duodenal polyps. Biopsied. These are most likely reactive/hyperplastic, not adenomatous. These polyps were friable with contact oozing, and thus may be a source of GI bleeding. No AVMs were seen on this examination. - The examined portion of the jejunum was normal.   FINAL MICROSCOPIC DIAGNOSIS:   A. DUODENUM, POLYPECTOMY:  Hyperplastic polyps with nonspecific inflammation in the stroma.  There are no diagnostic features of celiac disease.  Negative for adenomatous change, dysplasia and malignancy.   B. PYLORIS, POLYPECTOMY:  Benign hyperplastic gastric polyp with focal erosion of the surface  epithelium.  H. pylori are not identified.  Negative for dysplasia and malignancy.   C. ESOPHAGUS, BIOPSY:  Acute erosive esophagitis.  Microorganisms with morphologic features consistent with Candida species  are present.  Negative for dysplasia and malignancy.    Small bowel enteroscopy by Dr. Benson Norway 07/05/2021: - Normal esophagus. - Friable gastric mucosa. - A few recently bleeding angioectasias in the duodenum. Treated with a monopolar probe. - A single non-bleeding angiodysplastic lesion in the jejunum. Treated with a monopolar probe. - No specimens collected.   Flexible sigmoidoscopy 07/05/2021: - Blood in the rectum, in the recto-sigmoid colon and in the sigmoid colon. - No specimens collected. -Abdominal/pelvic CT angiogram was  recommended if he developed a rapid lower GI bleeding.   Small bowel enteroscopy by Dr. Bryan Lemma 03/26/2021: -Normal esophagus.  The  previously noted esophagitis dissecans has since healed. -Mild, nonulcerative gastritis.  Biopsied. -Mild inflammation with nodularity in the duodenal bulb.  This overall appeared improved from images on the prior studies. -2 nonbleeding angiectasia's in the duodenum.  Treated with argon plasma coagulation.   EGD 08/10/2020: - Normal esophagus. - Gastritis. - Bleeding duodenal - Bleeding duodenal ulcer with a visible vessel. Clips were placed x 3 with apparent hemostasis. - Duodenitis with nodularity and polypoid mucosa. - No specimens collected.   Small bowel enteroscopy 08/02/2019: - Esophagogastric landmarks identified. - Normal esophagus. - Normal stomach. - Prominent ampulla without overt adenomatous changes. Biopsied. - A few benign appearing duodenal polyps. Biopsied, one suspected AVM treated as above. 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 Treated with argon plasma coagulation (APC). - A single non-bleeding angiodysplastic lesion in the proximal jejunum. Treated with argon plasma coagulation   EGD 06/30/2019: - No source of iron deficiency anemia or heme positive stoIonlp eantideonstcopically evident. - Normal larynx. - Normal esophagus. - Normal stomach. - Duodenal polyp vs. prominent papilla, benign-appearing. Biopsied.   Colonoscopy 06/30/2019: - No active bleeding or source of heme positive stool/anemia seen. - Diverticulosis in the sigmoid colon. - Probable old blood in the terminal ileum, raising the question of a small bowel source (given negative egd). - The distal rectum and anal verge are normal on retroflexion view. - No specimens collected.  Past Medical History:  Diagnosis Date   Anemia    Diabetes mellitus without complication [facility])    patient denies   Dialysis patient [facility])    End stage chronic kidney disease ([place])    Hypertension    ICH (intracerebral hemorrhage) ([place]) 05/20/2017   PAD (peripheral artery disease) ([facility])     Shoulder pain, left 06/28/2013    Past Surgical History:  Procedure Laterality Date   A/V FISTULAGRAM N/A 08/15/2020   Procedure: A/V FISTULAGRAM - Left Upper;  Surgeon: Cherre Robins, MD;  Location: [place] [facility];  Service: Cardiovascular;  Laterality: N/A;   ABDOMINAL AORTOGRAM W/LOWER EXTREMITY N/A 04/08/2021   Procedure: ABDOMINAL AORTOGRAM W/LOWER EXTREMITY;  Surgeon: Waynetta Sandy, MD;  Location: [place] [facility];  Service: Cardiovascular;  Laterality: N/A;   AMPUTATION Left 04/16/2021   Procedure: LEFT BELOW KNEE AMPUTATION;  Surgeon: Angelia Mould, MD;  Location: [place];  Service: Vascular;  Laterality: Left;   AMPUTATION Left 06/17/2021   Procedure: REVISION AMPUTATION BELOW KNEE;  Surgeon: Broadus John, MD;  Location: [place];  Service: Vascular;  Laterality: Left;   APPENDECTOMY     APPLICATION OF WOUND VAC Left 06/17/2021   Procedure: APPLICATION OF WOUND VAC;  Surgeon: Broadus John, MD;  Location: [place];  Service: Vascular;  Laterality: Left;   AV FISTULA PLACEMENT Left 08/03/2019   Procedure: LEFT ARM ARTERIOVENOUS (AV) CEPHALIC  FISTULA CREATION;  Surgeon: Waynetta Sandy, MD;  Location: [place];  Service: Vascular;  Laterality: Left;   BIOPSY  06/30/2019   Procedure: BIOPSY;  Surgeon: Ronald Lobo, MD;  Location: [place];  Service: Endoscopy;;   BIOPSY  08/02/2019   Procedure: BIOPSY;  Surgeon: Yetta Flock, MD;  Location: [facility] ENDOSCOPY;  Service: Gastroenterology;;   BIOPSY  02/08/2021   Procedure: BIOPSY;  Surgeon: Sharyn Creamer, MD;  Location: [place];  Service: Gastroenterology;;   BIOPSY  02/24/2021  Procedure: BIOPSY;  Surgeon: [name], MD;  Location: [facility] ENDOSCOPY;  Service: Gastroenterology;;   BIOPSY  03/26/2021   Procedure: BIOPSY;  Surgeon: Lavena Bullion, DO;  Location: [place];  Service: Gastroenterology;;   BIOPSY  08/06/2021   Procedure: BIOPSY;  Surgeon: [name], MD;   Location: [place];  Service: Gastroenterology;;   COLONOSCOPY  01/23/2012   Procedure: COLONOSCOPY;  Surgeon: Danie Binder, MD;  [facility];  Service: Endoscopy;  Laterality: N/A;  11:10 AM   COLONOSCOPY WITH PROPOFOL N/A 06/30/2019   Procedure: COLONOSCOPY WITH PROPOFOL;  Surgeon: Ronald Lobo, MD;  Location: [place];  Service: Endoscopy;  Laterality: N/A;   ENTEROSCOPY N/A 08/02/2019   Procedure: ENTEROSCOPY;  Surgeon: Yetta Flock, MD;  Location: [facility] ENDOSCOPY;  Service: Gastroenterology;  Laterality: N/A;   ENTEROSCOPY N/A 02/24/2021   Procedure: ENTEROSCOPY;  Surgeon: [name], MD;  Location: [facility] ENDOSCOPY;  Service: Gastroenterology;  Laterality: N/A;   ENTEROSCOPY N/A 03/26/2021   Procedure: ENTEROSCOPY;  Surgeon: Lavena Bullion, DO;  Location: [place];  Service: Gastroenterology;  Laterality: N/A;   ENTEROSCOPY N/A 07/05/2021   Procedure: ENTEROSCOPY;  Surgeon: Carol Ada, MD;  Location: [place];  Service: Gastroenterology;  Laterality: N/A;   ENTEROSCOPY N/A 08/06/2021   Procedure: ENTEROSCOPY;  Surgeon: [name], MD;  Location: [facility] ENDOSCOPY;  Service: Gastroenterology;  Laterality: N/A;   ENTEROSCOPY N/A 08/24/2021   Procedure: ENTEROSCOPY;  Surgeon: Ladene Artist, MD;  Location: [facility] ENDOSCOPY;  Service: Gastroenterology;  Laterality: N/A;   ESOPHAGOGASTRODUODENOSCOPY N/A 08/10/2020   Procedure: ESOPHAGOGASTRODUODENOSCOPY (EGD);  Surgeon: Jerene Bears, MD;  Location: [facility] ENDOSCOPY;  Service: Gastroenterology;  Laterality: N/A;   ESOPHAGOGASTRODUODENOSCOPY (EGD) WITH PROPOFOL N/A 06/30/2019   Procedure: ESOPHAGOGASTRODUODENOSCOPY (EGD) WITH PROPOFOL;  Surgeon: Ronald Lobo, MD;  Location: [place];  Service: Endoscopy;  Laterality: N/A;   ESOPHAGOGASTRODUODENOSCOPY (EGD) WITH PROPOFOL N/A 01/12/2021   Procedure: ESOPHAGOGASTRODUODENOSCOPY (EGD) WITH PROPOFOL;  Surgeon: Sharyn Creamer, MD;  Location: [place];   Service: Gastroenterology;  Laterality: N/A;   ESOPHAGOGASTRODUODENOSCOPY (EGD) WITH PROPOFOL N/A 02/08/2021   Procedure: ESOPHAGOGASTRODUODENOSCOPY (EGD) WITH PROPOFOL;  Surgeon: Sharyn Creamer, MD;  Location: [place];  Service: Gastroenterology;  Laterality: N/A;   FISTULA SUPERFICIALIZATION Left 10/17/2019   Procedure: LEFT UPPER EXTREMITY FISTULA REVISION, SIDE BRANCH LIGATION,  AND SUPERFICIALIZATION;  Surgeon: Marty Heck, MD;  Location: [place];  Service: Vascular;  Laterality: Left;   FISTULA SUPERFICIALIZATION Left 42/68/3419   Procedure: PLICATION OF ANEURYSM LEFT ARTERIOVENOUS FISTULA;  Surgeon: Angelia Mould, MD;  Location: [place];  Service: Vascular;  Laterality: Left;   FLEXIBLE SIGMOIDOSCOPY N/A 07/05/2021   Procedure: FLEXIBLE SIGMOIDOSCOPY;  Surgeon: Carol Ada, MD;  Location: [place];  Service: Gastroenterology;  Laterality: N/A;   GIVENS CAPSULE STUDY N/A 06/30/2019   Procedure: GIVENS CAPSULE STUDY;  Surgeon: Ronald Lobo, MD;  Location: [place];  Service: Endoscopy;  Laterality: N/A;   GIVENS CAPSULE STUDY N/A 08/25/2021   Procedure: GIVENS CAPSULE STUDY;  Surgeon: Ladene Artist, MD;  Location: [place];  Service: Gastroenterology;  Laterality: N/A;   HEMOSTASIS CLIP PLACEMENT  08/10/2020   Procedure: HEMOSTASIS CLIP PLACEMENT;  Surgeon: Jerene Bears, MD;  Location: [place];  Service: Gastroenterology;;   HEMOSTASIS CLIP PLACEMENT  01/12/2021   Procedure: HEMOSTASIS CLIP PLACEMENT;  Surgeon: Sharyn Creamer, MD;  Location: [place];  Service: Gastroenterology;;   HEMOSTASIS CONTROL  08/02/2019   Procedure: HEMOSTASIS CONTROL;  Surgeon:  Yetta Flock, MD;  Location: [facility] ENDOSCOPY;  Service: Gastroenterology;;   HOT HEMOSTASIS  02/24/2021   Procedure: HOT HEMOSTASIS (ARGON PLASMA COAGULATION/BICAP);  Surgeon: [name], MD;  Location: [facility] ENDOSCOPY;  Service: Gastroenterology;;   HOT HEMOSTASIS N/A 03/26/2021    Procedure: HOT HEMOSTASIS (ARGON PLASMA COAGULATION/BICAP);  Surgeon: Lavena Bullion, DO;  Location: [facility] ENDOSCOPY;  Service: Gastroenterology;  Laterality: N/A;   HOT HEMOSTASIS N/A 07/05/2021   Procedure: HOT HEMOSTASIS (ARGON PLASMA COAGULATION/BICAP);  Surgeon: Carol Ada, MD;  Location: [place];  Service: Gastroenterology;  Laterality: N/A;   INCISION AND DRAINAGE ABSCESS N/A 06/29/2016   Procedure: INCISION AND DRAINAGE ABDOMINAL WALL ABSCESS;  Surgeon: Alphonsa Overall, MD;  Location: [facility];  Service: General;  Laterality: N/A;   INSERTION OF DIALYSIS CATHETER Right 08/03/2019   Procedure: INSERTION OF DIALYSIS CATHETER;  Surgeon: Waynetta Sandy, MD;  Location: [place];  Service: Vascular;  Laterality: Right;   INSERTION OF DIALYSIS CATHETER Right 10/22/2019   Procedure: INSERTION OF 23CM TUNNELED DIALYSIS CATHETER RIGHT INTERNAL JUGULAR;  Surgeon: Angelia Mould, MD;  Location: [place];  Service: Vascular;  Laterality: Right;   INSERTION OF DIALYSIS CATHETER Right 08/12/2020   Procedure: INSERTION OF Right internal Jugular TUNNELED  DIALYSIS CATHETER.;  Surgeon: Waynetta Sandy, MD;  Location: [place];  Service: Vascular;  Laterality: Right;   INSERTION OF DIALYSIS CATHETER N/A 04/19/2021   Procedure: INSERTION OF TUNNELED DIALYSIS CATHETER;  Surgeon: Angelia Mould, MD;  Location: [facility];  Service: Vascular;  Laterality: N/A;   IR RADIOLOGIST EVAL & MGMT  09/12/2021   Left heel surgery     LOWER EXTREMITY ANGIOGRAPHY N/A 07/08/2021   Procedure: Lower Extremity Angiography;  Surgeon: Cherre Robins, MD;  Location: [place] [facility];  Service: Cardiovascular;  Laterality: N/A;   PENILE BIOPSY N/A 03/26/2020   Procedure: PENILE ULCER DEBRIDEMENT;  Surgeon: Remi Haggard, MD;  Location: [facility];  Service: Urology;  Laterality: N/A;  30 MINS   PERIPHERAL VASCULAR INTERVENTION Right 07/08/2021   Procedure: PERIPHERAL VASCULAR INTERVENTION;  Surgeon: Cherre Robins, MD;  Location: [place] [facility];  Service: Cardiovascular;  Laterality: Right;   SCLEROTHERAPY  01/12/2021   Procedure: SCLEROTHERAPY;  Surgeon: Sharyn Creamer, MD;  Location: [place];  Service: Gastroenterology;;   Clide Deutscher  02/24/2021   Procedure: Clide Deutscher;  Surgeon: [name], MD;  Location: [facility] ENDOSCOPY;  Service: Gastroenterology;;   SUBMUCOSAL TATTOO INJECTION  08/24/2021   Procedure: SUBMUCOSAL TATTOO INJECTION;  Surgeon: Ladene Artist, MD;  Location: [facility] ENDOSCOPY;  Service: Gastroenterology;;    Family History  Problem Relation Age of Onset   Colon cancer Neg Hx     Social History   Tobacco Use   Smoking status: Every Day    Packs/day: 0.50    Years: 45.00    Pack years: 22.50    Types: Cigarettes   Smokeless tobacco: Never   Tobacco comments:    Pt left before information given  Vaping Use   Vaping Use: Never used  Substance Use Topics   Alcohol use: Not Currently   Drug use: Not Currently    Types: "Crack" cocaine    Comment: last in 2020    Prior to Admission medications   Medication Sig Start Date End Date Taking? Authorizing Provider  amLODipine (NORVASC) 2.5 MG tablet Take 1 tablet (2.5 mg total) by mouth at bedtime. Patient taking differently: Take 2.5 mg by mouth daily. 08/10/21  Bonnielee Haff, MD  aspirin 81 MG EC tablet Take 1 tablet (81 mg total) by mouth daily. Swallow whole. 08/10/21   Bonnielee Haff, MD  atorvastatin (LIPITOR) 40 MG tablet Take 1 tablet (40 mg total) by mouth daily. 08/20/21   Baglia, Corrina, PA-C  calcium acetate, Phos Binder, (PHOSLYRA) 667 MG/5ML SOLN Take 667 mg by mouth 3 (three) times daily with meals.    [provider]  Darbepoetin Alfa (ARANESP) 100 MCG/0.5ML SOSY injection Inject 0.5 mLs (100 mcg total) into the vein every 7 (seven) days for 8 doses. 08/19/21 10/08/21  Bonnell Public, MD  gabapentin (NEURONTIN) 100 MG capsule Take 1 capsule (100 mg total) by mouth 3 (three)  times daily. 08/10/21   Bonnielee Haff, MD  HYDROcodone-acetaminophen (NORCO/VICODIN) 5-325 MG tablet Take 1 tablet by mouth every 4 (four) hours as needed for severe pain. 08/27/21   Bonnielee Haff, MD  methocarbamol (ROBAXIN) 500 MG tablet Take 1 tablet (500 mg total) by mouth every 6 (six) hours as needed for muscle spasms. 08/27/21   Bonnielee Haff, MD  pantoprazole (PROTONIX) 40 MG tablet Take 1 tablet (40 mg total) by mouth 2 (two) times daily before a meal. 08/27/21   Bonnielee Haff, MD  traZODone (DESYREL) 50 MG tablet Take 0.5-1 tablets (25-50 mg total) by mouth at bedtime as needed for sleep. 09/03/21   [name], NP  colchicine 0.6 MG tablet Take 0.5 tablets (0.3 mg total) by mouth 2 (two) times daily. 07/24/20 07/29/20  Noemi Chapel, MD  furosemide (LASIX) 40 MG tablet Take 1 tablet (40 mg total) by mouth daily. 07/25/19 02/09/20  Ladona Horns, MD    No current facility-administered medications for this encounter.   Current Outpatient Medications  Medication Sig Dispense Refill   amLODipine (NORVASC) 2.5 MG tablet Take 1 tablet (2.5 mg total) by mouth at bedtime. (Patient taking differently: Take 2.5 mg by mouth daily.) 30 tablet 1   aspirin 81 MG EC tablet Take 1 tablet (81 mg total) by mouth daily. Swallow whole. 30 tablet 2   atorvastatin (LIPITOR) 40 MG tablet Take 1 tablet (40 mg total) by mouth daily. 30 tablet 11   calcium acetate, Phos Binder, (PHOSLYRA) 667 MG/5ML SOLN Take 667 mg by mouth 3 (three) times daily with meals.     Darbepoetin Alfa (ARANESP) 100 MCG/0.5ML SOSY injection Inject 0.5 mLs (100 mcg total) into the vein every 7 (seven) days for 8 doses. 4 mL 0   gabapentin (NEURONTIN) 100 MG capsule Take 1 capsule (100 mg total) by mouth 3 (three) times daily. 90 capsule 3   HYDROcodone-acetaminophen (NORCO/VICODIN) 5-325 MG tablet Take 1 tablet by mouth every 4 (four) hours as needed for severe pain. 30 tablet 0   methocarbamol (ROBAXIN) 500 MG tablet Take 1 tablet  (500 mg total) by mouth every 6 (six) hours as needed for muscle spasms. 30 tablet 0   pantoprazole (PROTONIX) 40 MG tablet Take 1 tablet (40 mg total) by mouth 2 (two) times daily before a meal. 60 tablet 2   traZODone (DESYREL) 50 MG tablet Take 0.5-1 tablets (25-50 mg total) by mouth at bedtime as needed for sleep. 30 tablet 3    Allergies as of 09/14/2021 - Review Complete 09/05/2021  Allergen Reaction Noted   Seroquel [quetiapine] Other (See Comments) 04/22/2021   Dilaudid [hydromorphone hcl] Itching and Other (See Comments) 07/29/2020     Review of Systems:    Constitutional: No weight loss, fever or chills Skin: No rash Cardiovascular:  No chest pain Respiratory: No SOB  Gastrointestinal: See HPI and otherwise negative Genitourinary: No dysuria Neurological: No headache, dizziness or syncope Musculoskeletal: No new muscle or joint pain Hematologic: No bruising Psychiatric: No history of depression or anxiety    Physical Exam:  Vital signs in last 24 hours: Temp:  [98.1 F (36.7 C)] 98.1 F (36.7 C) (05/20 1041) Pulse Rate:  [88-90] 90 (05/20 1200) Resp:  [16-18] 16 (05/20 1200) BP: (141-153)/(92-94) 153/94 (05/20 1200) SpO2:  [97 %-98 %] 98 % (05/20 1200)   General:   Pleasant AA male appears to be in NAD, Well developed, Well nourished, alert and cooperative Head:  Normocephalic and atraumatic. Eyes:   PEERL, EOMI. No icterus. Conjunctiva pink. Ears:  Normal auditory acuity. Neck:  Supple Throat: Oral cavity and pharynx without inflammation, swelling or lesion. Teeth in good condition. Lungs: Respirations even and unlabored. Lungs clear to auscultation bilaterally.   No wheezes, crackles, or rhonchi.  Heart: Normal S1, S2. No MRG. Regular rate and rhythm. No peripheral edema, cyanosis or pallor.  Abdomen:  Soft, nondistended, nontender. No rebound or guarding. Normal bowel sounds. No appreciable masses or hepatomegaly. Rectal:  Not performed.  Msk:  Symmetrical  without gross deformities. Peripheral pulses intact.  Extremities: BKA on the left with some swelling Neurologic:  Alert and  oriented x4;  grossly normal neurologically. Skin:   Dry and intact without significant lesions or rashes. Psychiatric: Demonstrates good judgement and reason without abnormal affect or behaviors.   LAB RESULTS: Recent Labs    09/14/21 1049  WBC 5.0  HGB 5.9*  HCT 18.2*  PLT 131*   BMET Recent Labs    09/14/21 1049  NA 138  K 4.3  CL 98  CO2 23  GLUCOSE 124*  BUN 62*  CREATININE 10.29*  CALCIUM 8.8*   LFT Recent Labs    09/14/21 1049  PROT 7.4  ALBUMIN 2.5*  AST 33  ALT 14  ALKPHOS 87  BILITOT 1.2    STUDIES: IR Radiologist Eval & Mgmt  Result Date: 09/12/2021 Please refer to notes tab for details about interventional procedure. (Op Note)    Impression / Plan:   Impression: 1.  Recurrent GI bleeding with acute on chronic anemia: 08/24/2021 small bowel enteroscopy with a few duodenal polyps and grade 1 esophageal varices as well as portal hypertensive gastropathy, Bleeding is possibly from this, also Dr. Hilarie Fredrickson capsule endoscopy 4/30 with no overt cause for bleeding seen, patient has continued melenic stools with hemoglobin dropped recently now down to 5.9; likely upper GI bleed 2.  Erosive esophagitis: History of this in the past 3.  Cirrhosis with portal hypertension and recent findings of [place] on MRI: Currently undergoing chemotherapy 4.  ESRD on HD MWF: He has missed a couple dialysis treatments and nephrology has been consulted   Plan: 1.  Plan for repeat enteroscopy tomorrow with Dr. Lorenso Courier.  Did discuss risks, benefits, limitations and alternatives and patient agrees to proceed. 2.  Patient be on clear liquid diet today and n.p.o. at midnight 3.  Continue to monitor hemoglobin with transfusion as needed less than 7 4.  Continue other supportive measures   Thank you for your kind consultation, we will continue to  follow.  Lavone Nian Yanai Hobson  09/14/2021, 12:21 PM

## 2021-09-14 NOTE — ED Notes (Signed)
Blood consent signed by pt, scanned into pt chart.

## 2021-09-14 NOTE — H&P (Signed)
History and Physical    Daniel [name] TTS:[id number] DOB: 1961-01-06 DOA: 09/14/2021  PCP: [name], NP (Confirm with patient/family/NH records and if not entered, this has to be entered at [facility] point of entry) Patient coming from: Home  I have personally briefly reviewed patient's old medical records in [place]  Chief Complaint: Black tarry stool  HPI: Daniel [name] is a 61 y.o. 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  Patient has a history of recurrent GI bleed, was recently hospitalized for episode of melena and acute blood loss anemia.  Denies any abdominal pain, no bloody diarrhea.  Increasingly, he has been feeling more generalized weakness and exertional dyspnea.  On last admission, EGD was done which showed no active bleed, and endoscopy was negative for active bleeding.  On discharge on May 2, decision was made to hold Plavix and left on patient on aspirin alone.  Due to generalized weakness, patient missed last 2 dialysis on Wednesday and Friday, now he complains about increasing leg swelling.  ED Course: No tachycardia no hypotension no hypoxia.  Hemoglobin 5.9, creatinine 10.2, BUN 62, K4.3, bicarb 23.   Review of Systems: As per HPI otherwise 14 point review of systems negative.    Past Medical History:  Diagnosis Date   Anemia    Diabetes mellitus without complication [facility])    patient denies   Dialysis patient [facility])    End stage chronic kidney disease ([place])    Hypertension    ICH (intracerebral hemorrhage) ([place]) 05/20/2017   PAD (peripheral artery disease) (HCC)    Shoulder pain, left 06/28/2013    Past Surgical  History:  Procedure Laterality Date   A/V FISTULAGRAM N/A 08/15/2020   Procedure: A/V FISTULAGRAM - Left Upper;  Surgeon: Cherre Robins, MD;  Location: [place] [facility];  Service: Cardiovascular;  Laterality: N/A;   ABDOMINAL AORTOGRAM W/LOWER EXTREMITY N/A 04/08/2021   Procedure: ABDOMINAL AORTOGRAM W/LOWER EXTREMITY;  Surgeon: Waynetta Sandy, MD;  Location: [place] [facility];  Service: Cardiovascular;  Laterality: N/A;   AMPUTATION Left 04/16/2021   Procedure: LEFT BELOW KNEE AMPUTATION;  Surgeon: Angelia Mould, MD;  Location: [place];  Service: Vascular;  Laterality: Left;   AMPUTATION Left 06/17/2021   Procedure: REVISION AMPUTATION BELOW KNEE;  Surgeon: Broadus John, MD;  Location: [place];  Service: Vascular;  Laterality: Left;   APPENDECTOMY     APPLICATION OF WOUND VAC Left 06/17/2021   Procedure: APPLICATION OF WOUND VAC;  Surgeon: Broadus John, MD;  Location: [place];  Service: Vascular;  Laterality: Left;   AV FISTULA PLACEMENT Left 08/03/2019   Procedure: LEFT ARM ARTERIOVENOUS (AV) CEPHALIC  FISTULA CREATION;  Surgeon: Waynetta Sandy, MD;  Location: [place];  Service: Vascular;  Laterality: Left;   BIOPSY  06/30/2019   Procedure: BIOPSY;  Surgeon: Ronald Lobo, MD;  Location: [place];  Service: Endoscopy;;   BIOPSY  08/02/2019   Procedure: BIOPSY;  Surgeon: Yetta Flock, MD;  Location: [facility] ENDOSCOPY;  Service: Gastroenterology;;   BIOPSY  02/08/2021   Procedure: BIOPSY;  Surgeon: Sharyn Creamer, MD;  Location: [place];  Service: Gastroenterology;;   BIOPSY  02/24/2021   Procedure: BIOPSY;  Surgeon: [name], MD;  Location: [facility] ENDOSCOPY;  Service: Gastroenterology;;   BIOPSY  03/26/2021   Procedure: BIOPSY;  Surgeon: Lavena Bullion, DO;  Location: [place];  Service: Gastroenterology;;   BIOPSY  08/06/2021   Procedure: BIOPSY;  Surgeon: [name], MD;  Location: [place];  Service: Gastroenterology;;    COLONOSCOPY  01/23/2012   Procedure: COLONOSCOPY;  Surgeon: Danie Binder, MD;  [facility]: [facility];  Service: Endoscopy;  Laterality: N/A;  11:10 AM   COLONOSCOPY WITH PROPOFOL N/A 06/30/2019   Procedure: COLONOSCOPY WITH PROPOFOL;  Surgeon: Ronald Lobo, MD;  Location: [place];  Service: Endoscopy;  Laterality: N/A;   ENTEROSCOPY N/A 08/02/2019   Procedure: ENTEROSCOPY;  Surgeon: Yetta Flock, MD;  Location: [facility] ENDOSCOPY;  Service: Gastroenterology;  Laterality: N/A;   ENTEROSCOPY N/A 02/24/2021   Procedure: ENTEROSCOPY;  Surgeon: [name], MD;  Location: [facility] ENDOSCOPY;  Service: Gastroenterology;  Laterality: N/A;   ENTEROSCOPY N/A 03/26/2021   Procedure: ENTEROSCOPY;  Surgeon: Lavena Bullion, DO;  Location: [place];  Service: Gastroenterology;  Laterality: N/A;   ENTEROSCOPY N/A 07/05/2021   Procedure: ENTEROSCOPY;  Surgeon: Carol Ada, MD;  Location: [place];  Service: Gastroenterology;  Laterality: N/A;   ENTEROSCOPY N/A 08/06/2021   Procedure: ENTEROSCOPY;  Surgeon: [name], MD;  Location: [facility] ENDOSCOPY;  Service: Gastroenterology;  Laterality: N/A;   ENTEROSCOPY N/A 08/24/2021   Procedure: ENTEROSCOPY;  Surgeon: Ladene Artist, MD;  Location: [facility] ENDOSCOPY;  Service: Gastroenterology;  Laterality: N/A;   ESOPHAGOGASTRODUODENOSCOPY N/A 08/10/2020   Procedure: ESOPHAGOGASTRODUODENOSCOPY (EGD);  Surgeon: Jerene Bears, MD;  Location: [facility] ENDOSCOPY;  Service: Gastroenterology;  Laterality: N/A;   ESOPHAGOGASTRODUODENOSCOPY (EGD) WITH PROPOFOL N/A 06/30/2019   Procedure: ESOPHAGOGASTRODUODENOSCOPY (EGD) WITH PROPOFOL;  Surgeon: Ronald Lobo, MD;  Location: [place];  Service: Endoscopy;  Laterality: N/A;   ESOPHAGOGASTRODUODENOSCOPY (EGD) WITH PROPOFOL N/A 01/12/2021   Procedure: ESOPHAGOGASTRODUODENOSCOPY (EGD) WITH PROPOFOL;  Surgeon: Sharyn Creamer, MD;  Location: [place];  Service: Gastroenterology;  Laterality: N/A;    ESOPHAGOGASTRODUODENOSCOPY (EGD) WITH PROPOFOL N/A 02/08/2021   Procedure: ESOPHAGOGASTRODUODENOSCOPY (EGD) WITH PROPOFOL;  Surgeon: Sharyn Creamer, MD;  Location: [place];  Service: Gastroenterology;  Laterality: N/A;   FISTULA SUPERFICIALIZATION Left 10/17/2019   Procedure: LEFT UPPER EXTREMITY FISTULA REVISION, SIDE BRANCH LIGATION,  AND SUPERFICIALIZATION;  Surgeon: Marty Heck, MD;  Location: [place];  Service: Vascular;  Laterality: Left;   FISTULA SUPERFICIALIZATION Left 02/22/2535   Procedure: PLICATION OF ANEURYSM LEFT ARTERIOVENOUS FISTULA;  Surgeon: Angelia Mould, MD;  Location: [place];  Service: Vascular;  Laterality: Left;   FLEXIBLE SIGMOIDOSCOPY N/A 07/05/2021   Procedure: FLEXIBLE SIGMOIDOSCOPY;  Surgeon: Carol Ada, MD;  Location: [place];  Service: Gastroenterology;  Laterality: N/A;   GIVENS CAPSULE STUDY N/A 06/30/2019   Procedure: GIVENS CAPSULE STUDY;  Surgeon: Ronald Lobo, MD;  Location: [place];  Service: Endoscopy;  Laterality: N/A;   GIVENS CAPSULE STUDY N/A 08/25/2021   Procedure: GIVENS CAPSULE STUDY;  Surgeon: Ladene Artist, MD;  Location: [place];  Service: Gastroenterology;  Laterality: N/A;   HEMOSTASIS CLIP PLACEMENT  08/10/2020   Procedure: HEMOSTASIS CLIP PLACEMENT;  Surgeon: Jerene Bears, MD;  Location: [facility] ENDOSCOPY;  Service: Gastroenterology;;   HEMOSTASIS CLIP PLACEMENT  01/12/2021   Procedure: HEMOSTASIS CLIP PLACEMENT;  Surgeon: Sharyn Creamer, MD;  Location: [facility] ENDOSCOPY;  Service: Gastroenterology;;   HEMOSTASIS CONTROL  08/02/2019   Procedure: HEMOSTASIS CONTROL;  Surgeon: Yetta Flock,  MD;  Location: [place];  Service: Gastroenterology;;   HOT HEMOSTASIS  02/24/2021   Procedure: HOT HEMOSTASIS (ARGON PLASMA COAGULATION/BICAP);  Surgeon: [name], MD;  Location: [facility] ENDOSCOPY;  Service: Gastroenterology;;   HOT HEMOSTASIS N/A 03/26/2021   Procedure: HOT HEMOSTASIS (ARGON PLASMA  COAGULATION/BICAP);  Surgeon: Lavena Bullion, DO;  Location: [facility] ENDOSCOPY;  Service: Gastroenterology;  Laterality: N/A;   HOT HEMOSTASIS N/A 07/05/2021   Procedure: HOT HEMOSTASIS (ARGON PLASMA COAGULATION/BICAP);  Surgeon: Carol Ada, MD;  Location: [place];  Service: Gastroenterology;  Laterality: N/A;   INCISION AND DRAINAGE ABSCESS N/A 06/29/2016   Procedure: INCISION AND DRAINAGE ABDOMINAL WALL ABSCESS;  Surgeon: Alphonsa Overall, MD;  Location: [facility];  Service: General;  Laterality: N/A;   INSERTION OF DIALYSIS CATHETER Right 08/03/2019   Procedure: INSERTION OF DIALYSIS CATHETER;  Surgeon: Waynetta Sandy, MD;  Location: [place];  Service: Vascular;  Laterality: Right;   INSERTION OF DIALYSIS CATHETER Right 10/22/2019   Procedure: INSERTION OF 23CM TUNNELED DIALYSIS CATHETER RIGHT INTERNAL JUGULAR;  Surgeon: Angelia Mould, MD;  Location: [place];  Service: Vascular;  Laterality: Right;   INSERTION OF DIALYSIS CATHETER Right 08/12/2020   Procedure: INSERTION OF Right internal Jugular TUNNELED  DIALYSIS CATHETER.;  Surgeon: Waynetta Sandy, MD;  Location: [place];  Service: Vascular;  Laterality: Right;   INSERTION OF DIALYSIS CATHETER N/A 04/19/2021   Procedure: INSERTION OF TUNNELED DIALYSIS CATHETER;  Surgeon: Angelia Mould, MD;  Location: [facility] OR;  Service: Vascular;  Laterality: N/A;   IR RADIOLOGIST EVAL & MGMT  09/12/2021   Left heel surgery     LOWER EXTREMITY ANGIOGRAPHY N/A 07/08/2021   Procedure: Lower Extremity Angiography;  Surgeon: Cherre Robins, MD;  Location: [place] [facility];  Service: Cardiovascular;  Laterality: N/A;   PENILE BIOPSY N/A 03/26/2020   Procedure: PENILE ULCER DEBRIDEMENT;  Surgeon: Remi Haggard, MD;  Location: [facility];  Service: Urology;  Laterality: N/A;  30 MINS   PERIPHERAL VASCULAR INTERVENTION Right 07/08/2021   Procedure: PERIPHERAL VASCULAR INTERVENTION;  Surgeon: Cherre Robins, MD;  Location: [place] [facility]  [facility];  Service: Cardiovascular;  Laterality: Right;   SCLEROTHERAPY  01/12/2021   Procedure: SCLEROTHERAPY;  Surgeon: Sharyn Creamer, MD;  Location: [facility] ENDOSCOPY;  Service: Gastroenterology;;   Clide Deutscher  02/24/2021   Procedure: Clide Deutscher;  Surgeon: [name], MD;  Location: [facility] ENDOSCOPY;  Service: Gastroenterology;;   SUBMUCOSAL TATTOO INJECTION  08/24/2021   Procedure: SUBMUCOSAL TATTOO INJECTION;  Surgeon: Ladene Artist, MD;  Location: [facility] ENDOSCOPY;  Service: Gastroenterology;;     reports that he has been smoking cigarettes. He has a 22.50 pack-year smoking history. He has never used smokeless tobacco. He reports that he does not currently use alcohol. He reports that he does not currently use drugs after having used the following drugs: "Crack" cocaine.  Allergies  Allergen Reactions   Seroquel [Quetiapine] Other (See Comments)    Tardive kinesia/dystonia   Dilaudid [Hydromorphone Hcl] Itching and Other (See Comments)    Pt reports itchiness after IM injection     Family History  Problem Relation Age of Onset   Colon cancer Neg Hx      Prior to Admission medications   Medication Sig Start Date End Date Taking? Authorizing Provider  amLODipine (NORVASC) 2.5 MG tablet Take 1 tablet (2.5 mg total) by mouth at bedtime. Patient taking differently: Take 2.5 mg by mouth daily. 08/10/21   Bonnielee Haff, MD  aspirin 81  MG EC tablet Take 1 tablet (81 mg total) by mouth daily. Swallow whole. 08/10/21   Bonnielee Haff, MD  atorvastatin (LIPITOR) 40 MG tablet Take 1 tablet (40 mg total) by mouth daily. 08/20/21   Baglia, Corrina, PA-C  calcium acetate, Phos Binder, (PHOSLYRA) 667 MG/5ML SOLN Take 667 mg by mouth 3 (three) times daily with meals.    [provider]  Darbepoetin Alfa (ARANESP) 100 MCG/0.5ML SOSY injection Inject 0.5 mLs (100 mcg total) into the vein every 7 (seven) days for 8 doses. 08/19/21 10/08/21  Bonnell Public, MD  gabapentin (NEURONTIN) 100  MG capsule Take 1 capsule (100 mg total) by mouth 3 (three) times daily. 08/10/21   Bonnielee Haff, MD  HYDROcodone-acetaminophen (NORCO/VICODIN) 5-325 MG tablet Take 1 tablet by mouth every 4 (four) hours as needed for severe pain. 08/27/21   Bonnielee Haff, MD  methocarbamol (ROBAXIN) 500 MG tablet Take 1 tablet (500 mg total) by mouth every 6 (six) hours as needed for muscle spasms. 08/27/21   Bonnielee Haff, MD  pantoprazole (PROTONIX) 40 MG tablet Take 1 tablet (40 mg total) by mouth 2 (two) times daily before a meal. 08/27/21   Bonnielee Haff, MD  traZODone (DESYREL) 50 MG tablet Take 0.5-1 tablets (25-50 mg total) by mouth at bedtime as needed for sleep. 09/03/21   [name], NP  colchicine 0.6 MG tablet Take 0.5 tablets (0.3 mg total) by mouth 2 (two) times daily. 07/24/20 07/29/20  Noemi Chapel, MD  furosemide (LASIX) 40 MG tablet Take 1 tablet (40 mg total) by mouth daily. 07/25/19 02/09/20  Ladona Horns, MD    Physical Exam: Vitals:   09/14/21 1041 09/14/21 1200 09/14/21 1227 09/14/21 1242  BP: (!) 141/92 (!) 153/94 (!) 154/87 (!) 151/86  Pulse: 88 90 88 87  Resp: 18 16 (!) 21 (!) 22  Temp: 98.1 F (36.7 C)  98 F (36.7 C) 97.9 F (36.6 C)  TempSrc: Oral  Temporal Temporal  SpO2: 97% 98%      Constitutional: NAD, calm, comfortable Vitals:   09/14/21 1041 09/14/21 1200 09/14/21 1227 09/14/21 1242  BP: (!) 141/92 (!) 153/94 (!) 154/87 (!) 151/86  Pulse: 88 90 88 87  Resp: 18 16 (!) 21 (!) 22  Temp: 98.1 F (36.7 C)  98 F (36.7 C) 97.9 F (36.6 C)  TempSrc: Oral  Temporal Temporal  SpO2: 97% 98%     Eyes: PERRL, lids and conjunctivae normal ENMT: Mucous membranes are moist. Posterior pharynx clear of any exudate or lesions.Normal dentition.  Neck: normal, supple, no masses, no thyromegaly Respiratory: clear to auscultation bilaterally, no wheezing, no crackles. Normal respiratory effort. No accessory muscle use.  Cardiovascular: Regular rate and rhythm, no murmurs /  rubs / gallops. 2+ extremity edema. 2+ pedal pulses. No carotid bruits.  Abdomen: no tenderness, no masses palpated. No hepatosplenomegaly. Bowel sounds positive.  Musculoskeletal: no clubbing / cyanosis. No joint deformity upper and lower extremities. Good ROM, no contractures. Normal muscle tone.  Skin: no rashes, lesions, ulcers. No induration Neurologic: CN 2-12 grossly intact. Sensation intact, DTR normal. Strength 5/5 in all 4.  Psychiatric: Normal judgment and insight. Alert and oriented x 3. Normal mood.     Labs on Admission: I have personally reviewed following labs and imaging studies  CBC: Recent Labs  Lab 09/14/21 1049  WBC 5.0  HGB 5.9*  HCT 18.2*  MCV 108.3*  PLT 841*   Basic Metabolic Panel: Recent Labs  Lab 09/14/21 1049  NA 138  K 4.3  CL 98  CO2 23  GLUCOSE 124*  BUN 62*  CREATININE 10.29*  CALCIUM 8.8*   GFR: Estimated Creatinine Clearance: 9.6 mL/min (A) (by C-G formula based on SCr of 10.29 mg/dL (H)). Liver Function Tests: Recent Labs  Lab 09/14/21 1049  AST 33  ALT 14  ALKPHOS 87  BILITOT 1.2  PROT 7.4  ALBUMIN 2.5*   No results for input(s): LIPASE, AMYLASE in the last 168 hours. No results for input(s): AMMONIA in the last 168 hours. Coagulation Profile: No results for input(s): INR, PROTIME in the last 168 hours. Cardiac Enzymes: No results for input(s): CKTOTAL, CKMB, CKMBINDEX, TROPONINI in the last 168 hours. BNP (last 3 results) No results for input(s): PROBNP in the last 8760 hours. HbA1C: No results for input(s): HGBA1C in the last 72 hours. CBG: No results for input(s): GLUCAP in the last 168 hours. Lipid Profile: No results for input(s): CHOL, HDL, LDLCALC, TRIG, CHOLHDL, LDLDIRECT in the last 72 hours. Thyroid Function Tests: No results for input(s): TSH, T4TOTAL, FREET4, T3FREE, THYROIDAB in the last 72 hours. Anemia Panel: No results for input(s): VITAMINB12, FOLATE, FERRITIN, TIBC, IRON, RETICCTPCT in the last 72  hours. Urine analysis:    Component Value Date/Time   COLORURINE YELLOW 07/31/2019 2128   APPEARANCEUR CLEAR 07/31/2019 2128   LABSPEC 1.009 07/31/2019 2128   PHURINE 5.0 07/31/2019 2128   GLUCOSEU NEGATIVE 07/31/2019 2128   HGBUR SMALL (A) 07/31/2019 2128   BILIRUBINUR NEGATIVE 07/31/2019 2128   KETONESUR NEGATIVE 07/31/2019 2128   PROTEINUR 100 (A) 09/03/2021 1150   UROBILINOGEN 0.2 12/31/2009 0746   NITRITE NEGATIVE 07/31/2019 2128   LEUKOCYTESUR NEGATIVE 07/31/2019 2128    Radiological Exams on Admission: DG Chest Portable 1 View  Result Date: 09/14/2021 CLINICAL DATA:  Shortness of breath EXAM: PORTABLE CHEST 1 VIEW COMPARISON:  08/16/2021 FINDINGS: Transverse diameter of heart is increased. Central pulmonary vessels are prominent. Increased interstitial markings are seen in the parahilar regions and lower lung fields. There is hazy homogeneous opacity in the right parahilar region. Lateral CP angles are indistinct. There is no pneumothorax. There is vascular stent in the left axilla. IMPRESSION: Cardiomegaly. Central pulmonary vessels are prominent suggesting possible mild CHF. Increased interstitial markings are seen in the parahilar regions and lower lung fields suggesting mild interstitial edema or interstitial pneumonia. There is hazy homogeneous opacity in the right parahilar region which may suggest loculated effusion in the interlobar fissure or subsegmental atelectasis. Electronically Signed   By: Elmer Picker M.D.   On: 09/14/2021 12:52   IR Radiologist Eval & Mgmt  Result Date: 09/12/2021 Please refer to notes tab for details about interventional procedure. (Op Note)   EKG: Independently reviewed.  Sinus, no acute ST changes.  Assessment/Plan Active Problems:   Acute blood loss anemia   ESRD (end stage renal disease) on dialysis ([place])   Hepatocellular carcinoma (HCC)   Lung mass   Acute lower GI bleeding   GI bleed  (please populate well all problems here  in Problem List. (For example, if patient is on BP meds at home and you resume or decide to hold them, it is a problem that needs to be her. Same for CAD, COPD, HLD and so on)  Acute on chronic blood loss anemia -Secondary to recurrent upper GI bleed likely -Continue PPI twice daily -GI consultation appreciated, clear liquid diet and n.p.o. after midnight. -Melena has been going on for the last 2+ weeks, no signs of life-threatening blood loss at  this moment, even though patient has increased uremia, decided to hold off DDAVP. GI will EGD tomorrow.  Explained to patient who expressed understanding and agreed. -Other Ddx, patient has history of liver cirrhosis, but on recent EGD as well as abdominal MRI, no significant portal hypertension or varices identified, will hold off Sandostatin drip  Acute on chronic HFpEF decompensation -Secondary to noncoherent to HD -Nephrology on board, emergency HD tonight versus tomorrow morning. -1 dose of 80 mg Lasix given  Hepatocellular carcinoma -Outpatient follow-up with oncology [name]  Liver cirrhosis -Secondary to hepatitis C and history of alcohol abuse -No significant ascites -Synthetic function, modest with hypoalbuminemia -We will check INR to determine if he need VitK and/or FFP  Right upper lobe lung mass -Has been followed with oncology and pulmonary, recent PET scan.  Continue outpatient management.  HTN -Hold off home BP meds, as needed hydralazine for now  ESRD on HD -Above.  Chronic back pain -MRI was done on last admission, showed multilevel degenerative always, continue home dose of narcotics.  DVT prophylaxis: SCD Code Status: Full code Family Communication: None at Bedside Disposition Plan: Complicated patient with recurrent GI bleed and decompensated heart failure and fluid overload, expect more than 2 midnight hospital stay. Consults called: [facility], nephrology Admission status: Telemetry admission   Lequita Halt  MD [facility] Pager [phone]  09/14/2021, 12:56 PM

## 2021-09-14 NOTE — Progress Notes (Signed)
[facility] Progress Note  Subjective: pt readmitted for black and bloody stools. Missed HD x 2 this week and is SOB.  CXR c/w edema.   Vitals:   09/14/21 1424 09/14/21 1432 09/14/21 1500 09/14/21 1530  BP: (!) 196/78 (!) 156/81 (!) 184/75 (!) 168/104  Pulse:   83 85  Resp:  16 17   Temp: (!) 97.5 F (36.4 C) (!) 97.5 F (36.4 C)    TempSrc:      SpO2:      Weight:        Exam: General: Alert, nad, RA, no distress Heart: RRR No m,r,g  Lungs: bilat rales up 1/3 Abdomen: soft non-tender Extremities: L BKA, 1-2 RLE edema, no thigh/ hip edema Dialysis Access: LUE AVF +bruit      Home meds - norvasc 2.5, asa, lipitor, phoslyra 1 ac, neurontin, norco, robaxin, protonix, trazodone prn   Dialysis Orders: SW MWF  4h 450/500  dry wt ?  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 Per pmd/ GI.  ESRD - on HD MWF. Missed HD x 2 this week. HD today.   MBD ckd - CCa in range, will add on phos.  HTN/volume - SOB and sounds wet on exam, max UF w/ HD today as tolerated. Not in distress, lying flat.  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  Lung mass/Hepatocellular carcinoma- followed by Oncology. Would hold ESA Anemia esrd - no ESA due to malignancy. Transfuse prn, IV Fe okay    Rob Chrysten Woulfe 09/14/2021, 3:59 PM   Recent Labs  Lab 09/14/21 1049  HGB 5.9*  ALBUMIN 2.5*  CALCIUM 8.8*  CREATININE 10.29*  K 4.3   Inpatient medications:  [START ON 09/15/2021] Chlorhexidine Gluconate Cloth  6 each Topical Q0600   furosemide  80 mg Intravenous Once   pantoprazole (PROTONIX) IV  40 mg Intravenous Q12H

## 2021-09-14 NOTE — Procedures (Signed)
PT arrived from the ED with BP of 156/96 and receiving Blood transfusion.  Blood was immediately, transferred to the hemodialysis machine for easier infusion.  Pt was asleep for almost his entire treatment and voiced no concerns until his last 30 min when he complained of cramping. His UF was immediately turned off for refill.  Vital signs were stable throughout his treatment.  Following his treatment he was decannulated and pressure was held at his cannulation sites.  No bleeding was noted and final BP was 154/117 with a net UF of 2962ms.  No medication was given.

## 2021-09-15 ENCOUNTER — Inpatient Hospital Stay (HOSPITAL_COMMUNITY): Payer: Medicare Other | Admitting: Certified Registered Nurse Anesthetist

## 2021-09-15 ENCOUNTER — Inpatient Hospital Stay (HOSPITAL_COMMUNITY): Payer: Medicare Other

## 2021-09-15 ENCOUNTER — Encounter (HOSPITAL_COMMUNITY)
Admission: EM | Disposition: A | Discharge status: Home Health | Payer: Self-pay | Source: Home / Self Care | Attending: Internal Medicine

## 2021-09-15 DIAGNOSIS — R5383 Other fatigue: Secondary | ICD-10-CM | POA: Diagnosis not present

## 2021-09-15 DIAGNOSIS — D62 Acute posthemorrhagic anemia: Secondary | ICD-10-CM

## 2021-09-15 DIAGNOSIS — K317 Polyp of stomach and duodenum: Secondary | ICD-10-CM | POA: Diagnosis not present

## 2021-09-15 DIAGNOSIS — K766 Portal hypertension: Secondary | ICD-10-CM

## 2021-09-15 DIAGNOSIS — K31811 Angiodysplasia of stomach and duodenum with bleeding: Principal | ICD-10-CM

## 2021-09-15 DIAGNOSIS — K3189 Other diseases of stomach and duodenum: Secondary | ICD-10-CM

## 2021-09-15 DIAGNOSIS — N186 End stage renal disease: Secondary | ICD-10-CM | POA: Diagnosis not present

## 2021-09-15 DIAGNOSIS — C220 Liver cell carcinoma: Secondary | ICD-10-CM

## 2021-09-15 DIAGNOSIS — I8501 Esophageal varices with bleeding: Secondary | ICD-10-CM

## 2021-09-15 DIAGNOSIS — I8510 Secondary esophageal varices without bleeding: Secondary | ICD-10-CM

## 2021-09-15 HISTORY — PX: SUBMUCOSAL TATTOO INJECTION: SHX6856

## 2021-09-15 HISTORY — PX: BIOPSY: SHX5522

## 2021-09-15 HISTORY — PX: ENTEROSCOPY: SHX5533

## 2021-09-15 HISTORY — PX: HOT HEMOSTASIS: SHX5433

## 2021-09-15 LAB — CBC
HCT: 19 % — ABNORMAL LOW (ref 39.0–52.0)
HCT: 22.6 % — ABNORMAL LOW (ref 39.0–52.0)
Hemoglobin: 6.2 g/dL — CL (ref 13.0–17.0)
Hemoglobin: 7.5 g/dL — ABNORMAL LOW (ref 13.0–17.0)
MCH: 33.5 pg (ref 26.0–34.0)
MCH: 34.4 pg — ABNORMAL HIGH (ref 26.0–34.0)
MCHC: 32.6 g/dL (ref 30.0–36.0)
MCHC: 33.2 g/dL (ref 30.0–36.0)
MCV: 102.7 fL — ABNORMAL HIGH (ref 80.0–100.0)
MCV: 103.7 fL — ABNORMAL HIGH (ref 80.0–100.0)
Platelets: 123 10*3/uL — ABNORMAL LOW (ref 150–400)
Platelets: 134 10*3/uL — ABNORMAL LOW (ref 150–400)
RBC: 1.85 MIL/uL — ABNORMAL LOW (ref 4.22–5.81)
RBC: 2.18 MIL/uL — ABNORMAL LOW (ref 4.22–5.81)
RDW: 21.1 % — ABNORMAL HIGH (ref 11.5–15.5)
RDW: 22.4 % — ABNORMAL HIGH (ref 11.5–15.5)
WBC: 5.1 10*3/uL (ref 4.0–10.5)
WBC: 6 10*3/uL (ref 4.0–10.5)
nRBC: 0 % (ref 0.0–0.2)
nRBC: 0.3 % — ABNORMAL HIGH (ref 0.0–0.2)

## 2021-09-15 LAB — RENAL FUNCTION PANEL
Albumin: 2.6 g/dL — ABNORMAL LOW (ref 3.5–5.0)
Anion gap: 11 (ref 5–15)
BUN: 29 mg/dL — ABNORMAL HIGH (ref 8–23)
CO2: 25 mmol/L (ref 22–32)
Calcium: 8.5 mg/dL — ABNORMAL LOW (ref 8.9–10.3)
Chloride: 95 mmol/L — ABNORMAL LOW (ref 98–111)
Creatinine, Ser: 6.19 mg/dL — ABNORMAL HIGH (ref 0.61–1.24)
GFR, Estimated: 10 mL/min — ABNORMAL LOW (ref >60)
Glucose, Bld: 92 mg/dL (ref 70–99)
Phosphorus: 5 mg/dL — ABNORMAL HIGH (ref 2.5–4.6)
Potassium: 3.9 mmol/L (ref 3.5–5.1)
Sodium: 131 mmol/L — ABNORMAL LOW (ref 135–145)

## 2021-09-15 LAB — COMPREHENSIVE METABOLIC PANEL
ALT: 12 U/L (ref 0–44)
AST: 30 U/L (ref 15–41)
Albumin: 2.3 g/dL — ABNORMAL LOW (ref 3.5–5.0)
Alkaline Phosphatase: 80 U/L (ref 38–126)
Anion gap: 10 (ref 5–15)
BUN: 22 mg/dL (ref 8–23)
CO2: 29 mmol/L (ref 22–32)
Calcium: 8.3 mg/dL — ABNORMAL LOW (ref 8.9–10.3)
Chloride: 97 mmol/L — ABNORMAL LOW (ref 98–111)
Creatinine, Ser: 5.05 mg/dL — ABNORMAL HIGH (ref 0.61–1.24)
GFR, Estimated: 12 mL/min — ABNORMAL LOW (ref >60)
Glucose, Bld: 83 mg/dL (ref 70–99)
Potassium: 3.4 mmol/L — ABNORMAL LOW (ref 3.5–5.1)
Sodium: 136 mmol/L (ref 135–145)
Total Bilirubin: 1 mg/dL (ref 0.3–1.2)
Total Protein: 7.1 g/dL (ref 6.5–8.1)

## 2021-09-15 LAB — GLUCOSE, CAPILLARY: Glucose-Capillary: 79 mg/dL (ref 70–99)

## 2021-09-15 LAB — HEMOGLOBIN AND HEMATOCRIT, BLOOD
HCT: 25 % — ABNORMAL LOW (ref 39.0–52.0)
Hemoglobin: 8.2 g/dL — ABNORMAL LOW (ref 13.0–17.0)

## 2021-09-15 LAB — TROPONIN I (HIGH SENSITIVITY)
Troponin I (High Sensitivity): 14 ng/L (ref <18)
Troponin I (High Sensitivity): 14 ng/L (ref <18)

## 2021-09-15 LAB — PREPARE RBC (CROSSMATCH)

## 2021-09-15 SURGERY — ENTEROSCOPY
Anesthesia: Monitor Anesthesia Care

## 2021-09-15 MED ORDER — SODIUM CHLORIDE 0.9 % IV SOLN: INTRAVENOUS | Status: DC

## 2021-09-15 MED ORDER — NITROGLYCERIN 0.4 MG SL SUBL: 0.4000 mg | SUBLINGUAL_TABLET | SUBLINGUAL | Status: DC | PRN

## 2021-09-15 MED ORDER — LIDOCAINE 2% (20 MG/ML) 5 ML SYRINGE
INTRAMUSCULAR | Status: DC | PRN
  Administered 2021-09-15 (×2): 40 mg via INTRAVENOUS

## 2021-09-15 MED ORDER — ALBUTEROL SULFATE (2.5 MG/3ML) 0.083% IN NEBU
INHALATION_SOLUTION | RESPIRATORY_TRACT | Status: AC
  Filled 2021-09-15: qty 3

## 2021-09-15 MED ORDER — PROPOFOL 10 MG/ML IV BOLUS
INTRAVENOUS | Status: DC | PRN
Start: 2021-09-15 — End: 2021-09-15
  Administered 2021-09-15 (×2): 10 mg via INTRAVENOUS

## 2021-09-15 MED ORDER — ALBUTEROL SULFATE (2.5 MG/3ML) 0.083% IN NEBU: 2.5000 mg | INHALATION_SOLUTION | Freq: Once | RESPIRATORY_TRACT | Status: DC

## 2021-09-15 MED ORDER — PHENYLEPHRINE 80 MCG/ML (10ML) SYRINGE FOR IV PUSH (FOR BLOOD PRESSURE SUPPORT)
PREFILLED_SYRINGE | INTRAVENOUS | Status: DC | PRN
  Administered 2021-09-15 (×2): 80 ug via INTRAVENOUS

## 2021-09-15 MED ORDER — GLUCAGON HCL RDNA (DIAGNOSTIC) 1 MG IJ SOLR
INTRAMUSCULAR | Status: DC | PRN
  Administered 2021-09-15: .25 mg via INTRAVENOUS

## 2021-09-15 MED ORDER — SPOT INK MARKER SYRINGE KIT
PACK | SUBMUCOSAL | Status: AC
  Filled 2021-09-15: qty 5

## 2021-09-15 MED ORDER — PROPOFOL 500 MG/50ML IV EMUL
INTRAVENOUS | Status: DC | PRN
  Administered 2021-09-15: 125 ug/kg/min via INTRAVENOUS

## 2021-09-15 MED ORDER — FUROSEMIDE 10 MG/ML IJ SOLN
40.0000 mg | Freq: Once | INTRAMUSCULAR | Status: AC
  Administered 2021-09-15: 40 mg via INTRAVENOUS
  Filled 2021-09-15: qty 4

## 2021-09-15 MED ORDER — MORPHINE SULFATE (PF) 2 MG/ML IV SOLN
0.5000 mg | INTRAVENOUS | Status: DC | PRN
Start: 2021-09-15 — End: 2021-09-17
  Administered 2021-09-16: 0.5 mg via INTRAVENOUS
  Filled 2021-09-15 (×2): qty 1

## 2021-09-15 MED ORDER — SODIUM CHLORIDE 0.9% IV SOLUTION: Freq: Once | INTRAVENOUS | Status: AC

## 2021-09-15 MED ORDER — CHLORHEXIDINE GLUCONATE CLOTH 2 % EX PADS
6.0000 | MEDICATED_PAD | Freq: Every day | CUTANEOUS | Status: DC
  Administered 2021-09-15 – 2021-09-18 (×4): 6 via TOPICAL

## 2021-09-15 MED ORDER — SPOT INK MARKER SYRINGE KIT
PACK | SUBMUCOSAL | Status: DC | PRN
  Administered 2021-09-15: .5 mL via SUBMUCOSAL

## 2021-09-15 NOTE — Op Note (Signed)
[facility] Patient Name: Daniel [name] Procedure Date : 09/15/2021 MRN: [id number] Attending MD: Georgian Co ,  Date of Birth: 27-Nov-1960 CSN: [id number] Age: 61 Admit Type: Inpatient Procedure:                Small bowel enteroscopy Indications:              Melena Providers:                Adline Mango" Theodis Sato, RN, [facility]                            Technician, Technician Referring MD:              Medicines:                Monitored Anesthesia Care Complications:            No immediate complications. Estimated Blood Loss:     Estimated blood loss was minimal. Procedure:                Pre-Anesthesia Assessment:                           - Prior to the procedure, a History and Physical                            was performed, and patient medications and                            allergies were reviewed. The patient's tolerance of                            previous anesthesia was also reviewed. The risks                            and benefits of the procedure and the sedation                            options and risks were discussed with the patient.                            All questions were answered, and informed consent                            was obtained. Prior Anticoagulants: The patient has                            taken no previous anticoagulant or antiplatelet                            agents. ASA Grade Assessment: III - A patient with                            severe systemic disease. After reviewing the risks  and benefits, the patient was deemed in                            satisfactory condition to undergo the procedure.                           After obtaining informed consent, the endoscope was                            passed under direct vision. Throughout the                            procedure, the patient's blood pressure, pulse, and                            oxygen  saturations were monitored continuously. The                            [id number]-[id number] ([id number] Olympus peds colonoscope was                            introduced through the mouth and advanced to the                            proximal jejunum. The small bowel enteroscopy was                            accomplished without difficulty. The patient                            tolerated the procedure well. Scope In: Scope Out: Findings:      Small (< 5 mm) varices were found in the distal esophagus.      Portal hypertensive gastropathy was found in the gastric body.      Biopsies were taken with a cold forceps on the greater curvature of the       gastric body, on the lesser curvature of the gastric body, at the       incisura, on the greater curvature of the gastric antrum and on the       lesser curvature of the gastric antrum for Helicobacter pylori testing.      A single 5 mm sessile polyp with no bleeding and no stigmata of recent       bleeding was found at the pylorus. Biopsies were taken with a cold       forceps for histology.      Three angioectasias with bleeding were found in the first portion of the       duodenum. Coagulation for bleeding prevention using argon plasma at 0.8       liters/minute and 20 watts was successful.      A tattoo was seen in the proximal jejunum. Impression:               - Small (< 5 mm) esophageal varices.                           - Portal hypertensive gastropathy.                           -  A single gastric polyp. Biopsied.                           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                            Treated with argon plasma coagulation (APC).                           - A tattoo was seen in the jejunum.                           - Biopsies were taken with a cold forceps for                            Helicobacter pylori testing. Recommendation:           - Return patient to hospital ward for ongoing care.                            - Use a proton pump inhibitor PO BID.                           - Await pathology results.                           - The findings and recommendations were discussed                            with the patient. Procedure Code(s):        --- Professional ---                           [phone], 72, Small intestinal endoscopy, enteroscopy                            beyond second portion of duodenum, not including                            ileum; with control of bleeding (eg, injection,                            bipolar cautery, unipolar cautery, laser, heater                            probe, stapler, plasma coagulator)                           [id number], Small intestinal endoscopy, enteroscopy                            beyond second portion of duodenum, not including                            ileum; with biopsy, single or multiple Diagnosis Code(s):        ---  Professional ---                           I85.00, Esophageal varices without bleeding                           K76.6, Portal hypertension                           K31.89, Other diseases of stomach and duodenum                           K31.7, Polyp of stomach and duodenum                           K31.811, Angiodysplasia of stomach and duodenum                            with bleeding                           K92.1, Melena (includes Hematochezia) CPT copyright 2019 American Medical Association. All rights reserved. The codes documented in this report are preliminary and upon coder review may  be revised to meet current compliance requirements. Sonny Masters "Christia Reading,  09/15/2021 3:13:57 PM Number of Addenda: 0

## 2021-09-15 NOTE — Progress Notes (Signed)
[facility]                                                                               Aariz Maish, is a 61 y.o. male, DOB - 07-03-1960, HYQ:[id number] Admit date - 09/14/2021    Outpatient Primary MD for the patient is Oletta Lamas, Milford Cage, NP  LOS - 1  days    Brief summary   Daniel [name] is a 61 y.o. 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 On last admission, EGD was done which showed no active bleed, and endoscopy was negative for active bleeding.  On discharge on May 2, decision was made to hold Plavix and left on patient on aspirin alone.  GI consulted and plan for SBE later today.   Assessment & Plan    Assessment and Plan:  Acute on chronic blood loss anemia:  Possibly from upper GI bleed.  Continue with PPI BID.  GI consulted and plan for SBE today.  Transfuse to keep hemoglobin greater than 7.  So far 2 units of PRBC transfusion. Recheck hemoglobin later this evening.    Acute on chronic HFpEF : Secondary to missing HD.  Nephrology consulted and plan for HD in am.     Hepatocellular ca;  Recommend outpatient follow up with Oncology.     Liver cirrhosis secondary to Hepatitis C and h/o alcohol abuse; Continue to monitor.    Right upper lobe lung mass.  Recommend follow up with oncology.     Hypertension:  Sub optimal. On prn hydralazine.     ESRD on HD.  Emergent HD last night.     Chronic back pain:  Resume home meds.   RN Pressure Injury Documentation: Pressure Injury 07/04/21 Buttocks Medial Stage 2 -  Partial thickness loss of dermis presenting as a shallow open injury with a red, pink wound bed  without slough. two small areas of broken skin in between buttocks. (Active)  07/04/21 0936  Location: Buttocks  Location Orientation: Medial  Staging: Stage 2 -  Partial thickness loss of dermis presenting as a shallow open injury with a red, pink wound bed without slough.  Wound Description (Comments): two small areas of broken skin in between buttocks.  Present on Admission:   Local wound care.       Estimated body mass index is 35.67 kg/m as calculated from the following:   Height as of this encounter: '5\' 11"'$  (1.803 m).   Weight as of this encounter: 116 kg.  Code Status: full code.  DVT Prophylaxis:  SCDs Start: 09/14/21 1256   Level of Care: Level of care: Telemetry Medical Family Communication: none at bedside.   Disposition Plan:     Remains inpatient appropriate:  SBE and prbc transfusions.   Procedures:  SBE  HD  Consultants:   NEPHROLOGY GASTROENTEROLOGY.   Antimicrobials:   Anti-infectives (From admission, onward)    None  Medications  Scheduled Meds:  [MAR Hold] aspirin EC  81 mg Oral Daily   [MAR Hold] atorvastatin  40 mg Oral Daily   [MAR Hold] calcium acetate (Phos Binder)  667 mg Oral TID WC   [MAR Hold] Chlorhexidine Gluconate Cloth  6 each Topical Q0600   [MAR Hold] Chlorhexidine Gluconate Cloth  6 each Topical Q0600   [MAR Hold] furosemide  80 mg Intravenous Once   [MAR Hold] gabapentin  100 mg Oral TID   [MAR Hold] pantoprazole (PROTONIX) IV  40 mg Intravenous Q12H   Continuous Infusions: PRN Meds:.[MAR Hold] hydrALAZINE, [MAR Hold] HYDROcodone-acetaminophen, [MAR Hold] methocarbamol, [MAR Hold] traZODone    Subjective:   Daniel [name] was seen and examined today.  Patient reports being dehydrated. And hungry.  Objective:   Vitals:   09/15/21 0930 09/15/21 1020 09/15/21 1040 09/15/21 1332  BP: 139/68 111/83 (!) 141/51 (!) 161/46  Pulse: 80 98 80 84  Resp: '19 17 17 16  '$ Temp: 98 F (36.7 C) 98 F (36.7 C) 98.2 F  (36.8 C) 99 F (37.2 C)  TempSrc: Oral Oral Oral Temporal  SpO2: 100% 99% 100% 95%  Weight:      Height:        Intake/Output Summary (Last 24 hours) at 09/15/2021 1335 Last data filed at 09/15/2021 1039 Gross per 24 hour  Intake 555 ml  Output 3118 ml  Net -2563 ml   Filed Weights   09/14/21 1353 09/14/21 1745 09/14/21 2046  Weight: 109.9 kg 107.8 kg 116 kg     Exam General exam: Appears calm and comfortable  Respiratory system: Clear to auscultation. Respiratory effort normal. Cardiovascular system: S1 & S2 heard, RRR. No JVD, murmurs, rubs, gallops or clicks. No pedal edema. Gastrointestinal system: Abdomen is nondistended, soft and nontender. Normal bowel sounds heard. Central nervous system: Alert and oriented. No focal neurological deficits. Extremities: Symmetric 5 x 5 power. Skin: No rashes, lesions or ulcers Psychiatry: mood is appropriate.     Data Reviewed:  I have personally reviewed following labs and imaging studies   CBC Lab Results  Component Value Date   WBC 5.1 09/15/2021   RBC 1.85 (L) 09/15/2021   HGB 6.2 (LL) 09/15/2021   HCT 19.0 (L) 09/15/2021   MCV 102.7 (H) 09/15/2021   MCH 33.5 09/15/2021   PLT 123 (L) 09/15/2021   MCHC 32.6 09/15/2021   RDW 21.1 (H) 09/15/2021   LYMPHSABS 1.6 09/03/2021   MONOABS 0.7 09/03/2021   EOSABS 0.1 09/03/2021   BASOSABS 0.0 68/03/7516     Last metabolic panel Lab Results  Component Value Date   NA 136 09/15/2021   K 3.4 (L) 09/15/2021   CL 97 (L) 09/15/2021   CO2 29 09/15/2021   BUN 22 09/15/2021   CREATININE 5.05 (H) 09/15/2021   GLUCOSE 83 09/15/2021   GFRNONAA 12 (L) 09/15/2021   GFRAA 7 (L) 12/28/2019   CALCIUM 8.3 (L) 09/15/2021   PHOS 3.2 08/27/2021   PROT 7.1 09/15/2021   ALBUMIN 2.3 (L) 09/15/2021   LABGLOB 4.5 12/28/2019   AGRATIO 0.9 (L) 12/28/2019   BILITOT 1.0 09/15/2021   ALKPHOS 80 09/15/2021   AST 30 09/15/2021   ALT 12 09/15/2021   ANIONGAP 10 09/15/2021    CBG (last 3)   No results for input(s): GLUCAP in the last 72 hours.    Coagulation Profile: No results for input(s): INR, PROTIME in the last 168 hours.   Radiology Studies: DG Pelvis Portable  Result Date: 09/15/2021 CLINICAL  DATA:  Fall with hip pain. EXAM: PORTABLE PELVIS 1-2 VIEWS COMPARISON:  Pelvis radiograph 08/11/2021 FINDINGS: No evidence of acute fracture of the pelvis or hips. Femoral heads remain seated. The bones are subjectively under mineralized. Grossly intact pubic rami. Pubic symphysis and sacroiliac joints are congruent. Advanced vascular calcifications. IMPRESSION: No acute fracture of the pelvis or hips. Electronically Signed   By: Keith Rake M.D.   On: 09/15/2021 00:16   DG Chest Portable 1 View  Result Date: 09/14/2021 CLINICAL DATA:  Shortness of breath EXAM: PORTABLE CHEST 1 VIEW COMPARISON:  08/16/2021 FINDINGS: Transverse diameter of heart is increased. Central pulmonary vessels are prominent. Increased interstitial markings are seen in the parahilar regions and lower lung fields. There is hazy homogeneous opacity in the right parahilar region. Lateral CP angles are indistinct. There is no pneumothorax. There is vascular stent in the left axilla. IMPRESSION: Cardiomegaly. Central pulmonary vessels are prominent suggesting possible mild CHF. Increased interstitial markings are seen in the parahilar regions and lower lung fields suggesting mild interstitial edema or interstitial pneumonia. There is hazy homogeneous opacity in the right parahilar region which may suggest loculated effusion in the interlobar fissure or subsegmental atelectasis. Electronically Signed   By: Elmer Picker M.D.   On: 09/14/2021 12:52       Hosie Poisson M.D. [facility] 09/15/2021, 1:35 PM  Available via Epic secure chat 7am-7pm After 7 pm, please refer to night coverage provider listed on amion.

## 2021-09-15 NOTE — Plan of Care (Signed)
  Problem: Nutrition: Goal: Adequate nutrition will be maintained Outcome: Progressing   Problem: Pain Managment: Goal: General experience of comfort will improve Outcome: Progressing   Problem: Safety: Goal: Ability to remain free from injury will improve Outcome: Progressing   

## 2021-09-15 NOTE — Anesthesia Procedure Notes (Signed)
Procedure Name: MAC Date/Time: 09/15/2021 2:24 PM Performed by: Janene Harvey, CRNA Pre-anesthesia Checklist: Patient identified, Emergency Drugs available, Suction available and Patient being monitored Patient Re-evaluated:Patient Re-evaluated prior to induction Oxygen Delivery Method: Nasal cannula Induction Type: IV induction Placement Confirmation: positive ETCO2 Dental Injury: Teeth and Oropharynx as per pre-operative assessment

## 2021-09-15 NOTE — Anesthesia Preprocedure Evaluation (Addendum)
Anesthesia Evaluation  Patient identified by MRN, date of birth, ID band Patient awake    Reviewed: Allergy & Precautions, H&P , NPO status , Patient's Chart, lab work & pertinent test results  Airway Mallampati: III  TM Distance: >3 FB Neck ROM: Full    Dental no notable dental hx. (+) Teeth Intact, Dental Advisory Given   Pulmonary Current Smoker and Patient abstained from smoking.,    Pulmonary exam normal breath sounds clear to auscultation       Cardiovascular hypertension, Pt. on medications + Peripheral Vascular Disease   Rhythm:Regular Rate:Normal     Neuro/Psych negative neurological ROS  negative psych ROS   GI/Hepatic Neg liver ROS, PUD, GERD  Medicated and Controlled,  Endo/Other  diabetesMorbid obesity  Renal/GU ESRF and DialysisRenal disease  negative genitourinary   Musculoskeletal   Abdominal   Peds  Hematology  (+) Blood dyscrasia, anemia ,   Anesthesia Other Findings   Reproductive/Obstetrics negative OB ROS                            Anesthesia Physical Anesthesia Plan  ASA: 3  Anesthesia Plan: MAC   Post-op Pain Management: Minimal or no pain anticipated   Induction: Intravenous  PONV Risk Score and Plan: 0 and Propofol infusion  Airway Management Planned: Natural Airway and Nasal Cannula  Additional Equipment:   Intra-op Plan:   Post-operative Plan:   Informed Consent: I have reviewed the patients History and Physical, chart, labs and discussed the procedure including the risks, benefits and alternatives for the proposed anesthesia with the patient or authorized representative who has indicated his/her understanding and acceptance.     Dental advisory given  Plan Discussed with: CRNA  Anesthesia Plan Comments:         Anesthesia Quick Evaluation

## 2021-09-15 NOTE — Interval H&P Note (Signed)
History and Physical Interval Note:  09/15/2021 2:12 PM  Daniel [name]  has presented today for surgery, with the diagnosis of GI BLeed.  The various methods of treatment have been discussed with the patient and family. After consideration of risks, benefits and other options for treatment, the patient has consented to  Procedure(s): ENTEROSCOPY (N/A) as a surgical intervention.  The patient's history has been reviewed, patient examined, no change in status, stable for surgery.  I have reviewed the patient's chart and labs.  Questions were answered to the patient's satisfaction.     Sharyn Creamer

## 2021-09-15 NOTE — Transfer of Care (Signed)
Immediate Anesthesia Transfer of Care Note  Patient: Daniel [name]  Procedure(s) Performed: ENTEROSCOPY HOT HEMOSTASIS (ARGON PLASMA COAGULATION/BICAP) SUBMUCOSAL TATTOO INJECTION BIOPSY  Patient Location: Endoscopy Unit  Anesthesia Type:MAC  Level of Consciousness: drowsy and patient cooperative  Airway & Oxygen Therapy: Patient Spontanous Breathing and Patient connected to nasal cannula oxygen  Post-op Assessment: Report given to RN and Post -op Vital signs reviewed and stable  Post vital signs: Reviewed and stable  Last Vitals:  Vitals Value Taken Time  BP 146/46   Temp    Pulse 83   Resp 19   SpO2 99%     Last Pain:  Vitals:   09/15/21 1332  TempSrc: Temporal  PainSc: 7       Patients Stated Pain Goal: 0 (20/10/07 1219)  Complications: No notable events documented.

## 2021-09-15 NOTE — Progress Notes (Signed)
Consent signed and in patient chart. NPO since MN. CHG wipe completed to entire body.

## 2021-09-15 NOTE — Anesthesia Postprocedure Evaluation (Signed)
Anesthesia Post Note  Patient: Daniel [name]  Procedure(s) Performed: ENTEROSCOPY HOT HEMOSTASIS (ARGON PLASMA COAGULATION/BICAP) SUBMUCOSAL TATTOO INJECTION BIOPSY     Patient location during evaluation: PACU Anesthesia Type: MAC Level of consciousness: awake and alert Pain management: pain level controlled Vital Signs Assessment: post-procedure vital signs reviewed and stable Respiratory status: spontaneous breathing, nonlabored ventilation and respiratory function stable Cardiovascular status: stable and blood pressure returned to baseline Postop Assessment: no apparent nausea or vomiting Anesthetic complications: no   No notable events documented.  Last Vitals:  Vitals:   09/15/21 1520 09/15/21 1525  BP: (!) 133/38 (!) 117/44  Pulse: 84 85  Resp: (!) 21 (!) 25  Temp:    SpO2: 100% 99%    Last Pain:  Vitals:   09/15/21 1507  TempSrc:   PainSc: 0-No pain                 Christle Nolting,W. EDMOND

## 2021-09-15 NOTE — Progress Notes (Signed)
[facility] Progress Note   Subjective:   Complains of thirst this AM, currently NPO. Reports pain in his back with deep breaths since admission, not worse with movement. Also report stable SOB with exertion. Denies cough, CP, palpitations and dizziness. Reports his legs are always edematous.   Objective Vitals:   09/14/21 2046 09/15/21 0010 09/15/21 0543 09/15/21 0800  BP: 136/74 (!) 151/85 (!) 146/89 (!) 120/57  Pulse: 84 84 82 78  Resp: '18 18 17 17  '$ Temp: 98.1 F (36.7 C) 98 F (36.7 C) 98.3 F (36.8 C) 97.8 F (36.6 C)  TempSrc: Oral Oral Oral Oral  SpO2: 97% 96% 93% 99%  Weight: 116 kg     Height: '5\' 11"'$  (1.803 m)      Physical Exam General: WDWn male, alert and in NAD Heart: RRR, no murmurs, rubs or gallops Lungs: CTA bilaterally, diminished in R base Abdomen: Soft, non-distended, +BS Extremities: L AKA, 1+ pitting pedal edema RLE Dialysis Access:  LUE AVF + bruit  Additional Objective Labs: Basic Metabolic Panel: Recent Labs  Lab 09/14/21 1049 09/15/21 0010  NA 138 136  K 4.3 3.4*  CL 98 97*  CO2 23 29  GLUCOSE 124* 83  BUN 62* 22  CREATININE 10.29* 5.05*  CALCIUM 8.8* 8.3*   Liver Function Tests: Recent Labs  Lab 09/14/21 1049 09/15/21 0010  AST 33 30  ALT 14 12  ALKPHOS 87 80  BILITOT 1.2 1.0  PROT 7.4 7.1  ALBUMIN 2.5* 2.3*   No results for input(s): LIPASE, AMYLASE in the last 168 hours. CBC: Recent Labs  Lab 09/14/21 1049 09/14/21 2213 09/15/21 0010  WBC 5.0 5.2 5.1  HGB 5.9* 6.2* 6.2*  HCT 18.2* 18.5* 19.0*  MCV 108.3* 101.6* 102.7*  PLT 131* 127* 123*   Blood Culture    Component Value Date/Time   SDES BLOOD BLOOD RIGHT HAND 08/16/2021 2049   SPECREQUEST  08/16/2021 2049    BOTTLES DRAWN AEROBIC AND ANAEROBIC Blood Culture adequate volume   CULT  08/16/2021 2049    NO GROWTH 5 DAYS Performed at [facility], [place] [address], [place], [place] [address]    REPTSTATUS 08/21/2021 FINAL 08/16/2021 2049     Cardiac Enzymes: No results for input(s): CKTOTAL, CKMB, CKMBINDEX, TROPONINI in the last 168 hours. CBG: No results for input(s): GLUCAP in the last 168 hours. Iron Studies: No results for input(s): IRON, TIBC, TRANSFERRIN, FERRITIN in the last 72 hours. '@lablastinr3'$ @ Studies/Results: DG Pelvis Portable  Result Date: 09/15/2021 CLINICAL DATA:  Fall with hip pain. EXAM: PORTABLE PELVIS 1-2 VIEWS COMPARISON:  Pelvis radiograph 08/11/2021 FINDINGS: No evidence of acute fracture of the pelvis or hips. Femoral heads remain seated. The bones are subjectively under mineralized. Grossly intact pubic rami. Pubic symphysis and sacroiliac joints are congruent. Advanced vascular calcifications. IMPRESSION: No acute fracture of the pelvis or hips. Electronically Signed   By: Keith Rake M.D.   On: 09/15/2021 00:16   DG Chest Portable 1 View  Result Date: 09/14/2021 CLINICAL DATA:  Shortness of breath EXAM: PORTABLE CHEST 1 VIEW COMPARISON:  08/16/2021 FINDINGS: Transverse diameter of heart is increased. Central pulmonary vessels are prominent. Increased interstitial markings are seen in the parahilar regions and lower lung fields. There is hazy homogeneous opacity in the right parahilar region. Lateral CP angles are indistinct. There is no pneumothorax. There is vascular stent in the left axilla. IMPRESSION: Cardiomegaly. Central pulmonary vessels are prominent suggesting possible mild CHF. Increased interstitial markings are seen  in the parahilar regions and lower lung fields suggesting mild interstitial edema or interstitial pneumonia. There is hazy homogeneous opacity in the right parahilar region which may suggest loculated effusion in the interlobar fissure or subsegmental atelectasis. Electronically Signed   By: Elmer Picker M.D.   On: 09/14/2021 12:52   Medications:   sodium chloride   Intravenous Once   aspirin EC  81 mg Oral Daily   atorvastatin  40 mg Oral Daily   calcium acetate  (Phos Binder)  667 mg Oral TID WC   Chlorhexidine Gluconate Cloth  6 each Topical Q0600   furosemide  80 mg Intravenous Once   gabapentin  100 mg Oral TID   pantoprazole (PROTONIX) IV  40 mg Intravenous Q12H    Dialysis Orders: SW MWF  4h 450/500  dry wt ?  2/2 bath  LUA AVF   Hep none - needs updating- [facility] undergoing maintenance on Sunday  Assessment/Plan: Recurrent GI bleed/acute on chronic anemia - sp recent EGD that showed portal hypertensive gastropathy in the entire examined stomach and grade I varices in the distal esophagus. Planned for enteroscopy again today. Hgb still low at 6.2, another unit PRBC ordered today ESRD - on HD MWF. Missed HD x 2 this week. HD yesterday with net UF approx 3L, still volume overloaded but not in any respiratory distress. His back pain may be pleuritic in nature, will see if it improves with further UF tomorrow   MBD ckd - CCa in range, checking RPF with HD tomorrow. 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 Holding ESA  Anice Paganini, PA-C 09/15/2021, 8:35 AM  [facility] Pager: [phone]

## 2021-09-16 ENCOUNTER — Encounter (HOSPITAL_COMMUNITY): Payer: Self-pay | Admitting: Internal Medicine

## 2021-09-16 ENCOUNTER — Inpatient Hospital Stay (HOSPITAL_COMMUNITY): Payer: Medicare Other

## 2021-09-16 DIAGNOSIS — K922 Gastrointestinal hemorrhage, unspecified: Secondary | ICD-10-CM | POA: Diagnosis not present

## 2021-09-16 DIAGNOSIS — D62 Acute posthemorrhagic anemia: Secondary | ICD-10-CM | POA: Diagnosis not present

## 2021-09-16 DIAGNOSIS — R5383 Other fatigue: Secondary | ICD-10-CM | POA: Diagnosis not present

## 2021-09-16 DIAGNOSIS — C220 Liver cell carcinoma: Secondary | ICD-10-CM | POA: Diagnosis not present

## 2021-09-16 DIAGNOSIS — K5521 Angiodysplasia of colon with hemorrhage: Secondary | ICD-10-CM

## 2021-09-16 DIAGNOSIS — N186 End stage renal disease: Secondary | ICD-10-CM | POA: Diagnosis not present

## 2021-09-16 DIAGNOSIS — D649 Anemia, unspecified: Secondary | ICD-10-CM | POA: Diagnosis not present

## 2021-09-16 LAB — RENAL FUNCTION PANEL
Albumin: 2.5 g/dL — ABNORMAL LOW (ref 3.5–5.0)
Anion gap: 9 (ref 5–15)
BUN: 19 mg/dL (ref 8–23)
CO2: 27 mmol/L (ref 22–32)
Calcium: 8.2 mg/dL — ABNORMAL LOW (ref 8.9–10.3)
Chloride: 95 mmol/L — ABNORMAL LOW (ref 98–111)
Creatinine, Ser: 4.08 mg/dL — ABNORMAL HIGH (ref 0.61–1.24)
GFR, Estimated: 16 mL/min — ABNORMAL LOW (ref >60)
Glucose, Bld: 93 mg/dL (ref 70–99)
Phosphorus: 2.9 mg/dL (ref 2.5–4.6)
Potassium: 3.1 mmol/L — ABNORMAL LOW (ref 3.5–5.1)
Sodium: 131 mmol/L — ABNORMAL LOW (ref 135–145)

## 2021-09-16 LAB — CBC
HCT: 22.6 % — ABNORMAL LOW (ref 39.0–52.0)
Hemoglobin: 7.3 g/dL — ABNORMAL LOW (ref 13.0–17.0)
MCH: 33.3 pg (ref 26.0–34.0)
MCHC: 32.3 g/dL (ref 30.0–36.0)
MCV: 103.2 fL — ABNORMAL HIGH (ref 80.0–100.0)
Platelets: 129 10*3/uL — ABNORMAL LOW (ref 150–400)
RBC: 2.19 MIL/uL — ABNORMAL LOW (ref 4.22–5.81)
RDW: 21.7 % — ABNORMAL HIGH (ref 11.5–15.5)
WBC: 5.6 10*3/uL (ref 4.0–10.5)
nRBC: 0 % (ref 0.0–0.2)

## 2021-09-16 LAB — HEMOGLOBIN AND HEMATOCRIT, BLOOD
HCT: 27.1 % — ABNORMAL LOW (ref 39.0–52.0)
Hemoglobin: 8.9 g/dL — ABNORMAL LOW (ref 13.0–17.0)

## 2021-09-16 NOTE — Progress Notes (Signed)
Mobility Specialist Progress Note   09/16/21 1658  Mobility  Activity  (Bed level exercises)  Range of Motion/Exercises Right leg;Left leg (Left limb*)  Level of Assistance Independent after set-up  Assistive Device None  LLE Weight Bearing NWB  Activity Response Tolerated well  $Mobility charge 1 Mobility   Received pt in bed c/o of Bil shoulder pain(10/10) and declining ambulation but agreeable bed level exercise. Pt completed exercises w/o fault and presented moderate strength during. Left supine w/ call bell in reach and needs met.    Holland Falling Mobility Specialist Phone Number [phone]

## 2021-09-16 NOTE — Progress Notes (Signed)
[facility]                                                                               Daniel [name], is a 61 y.o. male, DOB - [birth year], JAS:[id number] Admit date - 09/14/2021    Outpatient Primary MD for the patient is Oletta Lamas, Milford Cage, NP  LOS - 2  days    Brief summary   Daniel [name] is a 61 y.o. 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 On last admission, EGD was done which showed no active bleed, and endoscopy was negative for active bleeding.  On discharge on May 2, decision was made to hold Plavix and left on patient on aspirin alone.  GI consulted , pt underwent SBE showing small esophageal varices, portal hypertensive gastropathy,. 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 Treated with APC. Biopsies were taken for H pylori.   Assessment & Plan    Assessment and Plan:  Acute anemia of blood loss  on chronic blood loss anemia:  Possibly from upper GI bleed.  Continue with PPI BID.  GI consulted and plan for SBE done showing small esophageal varices, portal hypertensive gastropathy,. 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 Treated with APC. Biopsies were taken for H pylori.  Transfuse to keep hemoglobin greater than 7.  Pt underwent 3 units of prbc transfusion, repeat hemoglobin in am.     Acute on chronic HFpEF : Secondary to missing HD.  Nephrology consulted , plan for HD today.    Hepatocellular cancer: Recommend outpatient follow up with Oncology.    Liver cirrhosis secondary to Hepatitis C and h/o alcohol abuse; Continue to monitor.    Right upper lobe lung mass.  Recommend follow up with oncology.      Hypertension:  Sub optimal. On prn hydralazine.     ESRD on HD.  Emergent HD last night. Nephrology on board.     Hyponatremia From ESRD and fluid overload.    Mild hypokalemia:  No supplementation.  Monitor.    Chronic back pain:  Resume home meds.   RN Pressure Injury Documentation: Pressure Injury 07/04/21 Buttocks Medial Stage 2 -  Partial thickness loss of dermis presenting as a shallow open injury with a red, pink wound bed without slough. two small areas of broken skin in between buttocks. (Active)  07/04/21 0936  Location: Buttocks  Location Orientation: Medial  Staging: Stage 2 -  Partial thickness loss of dermis presenting as a shallow open injury with a red, pink wound bed without slough.  Wound Description (Comments): two small areas of broken skin in between buttocks.  Present on Admission:   Local wound care.       Estimated body mass index is 33.64 kg/m as calculated from the following:   Height as of this encounter: '5\' 11"'$  (1.803 m).   Weight as of this encounter: 109.4 kg.  Code Status: full code.  DVT Prophylaxis:  SCDs Start: 09/14/21 1256   Level of Care: Level of care: Telemetry Medical Family Communication: none at bedside.   Disposition Plan:     Remains inpatient appropriate:  HD/ fluid overload. PRBC transfusion.   Procedures:  SBE  HD  Consultants:   NEPHROLOGY GASTROENTEROLOGY.   Antimicrobials:   Anti-infectives (From admission, onward)    None        Medications  Scheduled Meds:  albuterol  2.5 mg Nebulization Once   aspirin EC  81 mg Oral Daily   atorvastatin  40 mg Oral Daily   calcium acetate (Phos Binder)  667 mg Oral TID WC   Chlorhexidine Gluconate Cloth  6 each Topical Q0600   furosemide  80 mg Intravenous Once   gabapentin  100 mg Oral TID   pantoprazole (PROTONIX) IV  40 mg Intravenous Q12H   Continuous Infusions: PRN Meds:.hydrALAZINE, HYDROcodone-acetaminophen, methocarbamol, morphine  injection, nitroGLYCERIN, traZODone    Subjective:   Daniel [name] was seen and examined today.  Sob improved. No chest pain.  Objective:   Vitals:   09/16/21 1240 09/16/21 1244 09/16/21 1250 09/16/21 1328  BP:  (!) 176/95  (!) 165/87  Pulse:    88  Resp: '18 19 19 18  '$ Temp:  98.6 F (37 C)  98.7 F (37.1 C)  TempSrc:  Oral  Oral  SpO2:    96%  Weight:      Height:        Intake/Output Summary (Last 24 hours) at 09/16/2021 1404 Last data filed at 09/16/2021 1220 Gross per 24 hour  Intake 655 ml  Output 3851 ml  Net -3196 ml    Filed Weights   09/14/21 2046 09/16/21 0800 09/16/21 1220  Weight: 116 kg 112.9 kg 109.4 kg     Exam General exam: Appears calm and comfortable  Respiratory system: Clear to auscultation. Respiratory effort normal. Cardiovascular system: S1 & S2 heard, RRR. No JVD, . No pedal edema. Gastrointestinal system: Abdomen is nondistended, soft and nontender. Normal bowel sounds heard. Central nervous system: Alert and oriented. No focal neurological deficits. Extremities: BKA  Skin: No rashes, lesions or ulcers Psychiatry:  Mood & affect appropriate.      Data Reviewed:  I have personally reviewed following labs and imaging studies   CBC Lab Results  Component Value Date   WBC 5.6 09/16/2021   RBC 2.19 (L) 09/16/2021   HGB 7.3 (L) 09/16/2021   HCT 22.6 (L) 09/16/2021   MCV 103.2 (H) 09/16/2021   MCH 33.3 09/16/2021   PLT 129 (L) 09/16/2021   MCHC 32.3 09/16/2021   RDW 21.7 (H) 09/16/2021   LYMPHSABS 1.6 09/03/2021   MONOABS 0.7 09/03/2021   EOSABS 0.1 09/03/2021   BASOSABS 0.0 62/56/3893     Last metabolic panel Lab Results  Component Value Date   NA 131 (L) 09/16/2021   K 3.1 (L) 09/16/2021   CL 95 (L) 09/16/2021   CO2 27 09/16/2021   BUN 19 09/16/2021   CREATININE 4.08 (H) 09/16/2021   GLUCOSE 93 09/16/2021   GFRNONAA 16 (L) 09/16/2021   GFRAA 7 (L) 12/28/2019   CALCIUM 8.2 (L) 09/16/2021   PHOS 2.9 09/16/2021    PROT 7.1 09/15/2021   ALBUMIN 2.5 (L) 09/16/2021   LABGLOB 4.5 12/28/2019   AGRATIO 0.9 (L) 12/28/2019   BILITOT 1.0 09/15/2021   ALKPHOS 80 09/15/2021   AST 30 09/15/2021   ALT 12 09/15/2021   ANIONGAP 9 09/16/2021    CBG (last  3)  Recent Labs    09/15/21 1342  GLUCAP 79      Coagulation Profile: Recent Labs  Lab 09/14/21 2213  INR 1.1     Radiology Studies: DG Pelvis Portable  Result Date: 09/15/2021 CLINICAL DATA:  Fall with hip pain. EXAM: PORTABLE PELVIS 1-2 VIEWS COMPARISON:  Pelvis radiograph 08/11/2021 FINDINGS: No evidence of acute fracture of the pelvis or hips. Femoral heads remain seated. The bones are subjectively under mineralized. Grossly intact pubic rami. Pubic symphysis and sacroiliac joints are congruent. Advanced vascular calcifications. IMPRESSION: No acute fracture of the pelvis or hips. Electronically Signed   By: Keith Rake M.D.   On: 09/15/2021 00:16   DG CHEST PORT 1 VIEW  Result Date: 09/15/2021 CLINICAL DATA:  Shortness of breath EXAM: PORTABLE CHEST 1 VIEW COMPARISON:  09/14/2021 FINDINGS: Stable cardiomegaly. Aortic atherosclerosis. Mild pulmonary vascular congestion. Mildly increased perihilar and bibasilar interstitial markings bilaterally. No large pleural fluid collection. No pneumothorax. IMPRESSION: Findings suggestive of CHF with mild interstitial edema. Electronically Signed   By: Davina Poke D.O.   On: 09/15/2021 18:46       Hosie Poisson M.D. [facility] 09/16/2021, 2:04 PM  Available via Epic secure chat 7am-7pm After 7 pm, please refer to night coverage provider listed on amion.

## 2021-09-16 NOTE — Progress Notes (Addendum)
[facility] Progress Note  CC:  Recurrent GI bleed   Subjective: He complains of having shoulder pain R > L which is worse when he attempt to pull himself up in bed. No CP or SOB. No N/V. He passed a small amount of loose black stool last night. No further BM since then but he feels as if he will have another bowel movement soon.  Addendum: Nurse Tech reported patient just passed a small volume of loose black stool.    Objective:  Small bowel enteroscopy 09/15/2021 - Small (< 5 mm) esophageal varices. - Portal hypertensive gastropathy. - A single gastric polyp. Biopsied. 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 Treated with argon plasma coagulation (APC). - A tattoo was seen in the jejunum. - Biopsies were taken with a cold forceps for Helicobacter pylori testing.  Vital signs in last 24 hours: Temp:  [98 F (36.7 C)-98.7 F (37.1 C)] 98.7 F (37.1 C) (05/22 1328) Pulse Rate:  [82-91] 88 (05/22 1328) Resp:  [13-27] 18 (05/22 1328) BP: (117-188)/(38-144) 165/87 (05/22 1328) SpO2:  [94 %-100 %] 96 % (05/22 1328) Weight:  [109.4 kg-112.9 kg] 109.4 kg (05/22 1220) Last BM Date : 09/15/21 General: 61 year old male in no acute distress. Heart: Regular rate and rhythm, no murmur. Pulm: Breath sounds clear throughout. Abdomen: Soft, nondistended.  Nontender.  Positive bowel sounds to all 4 quadrants. Extremities:  Without edema. Left BKA. LUE fistula with + bruit and thrill.  Neurologic:  Alert and  oriented x 4.  Speech is clear. Psych:  Alert and cooperative. Normal mood and affect.  Intake/Output from previous day: 05/21 0701 - 05/22 0700 In: 240 [P.O.:240] Out: 551 [Urine:550; Stool:1] Intake/Output this shift: Total I/O In: 415 [I.V.:100; Blood:315] Out: 3500 [Other:3500]  Lab Results: Recent Labs    09/15/21 0010 09/15/21 1814 09/15/21 2142 09/16/21 0820  WBC 5.1  --  6.0 5.6  HGB 6.2* 8.2* 7.5* 7.3*  HCT 19.0* 25.0* 22.6* 22.6*  PLT  123*  --  134* 129*   BMET Recent Labs    09/15/21 0010 09/15/21 1928 09/16/21 0921  NA 136 131* 131*  K 3.4* 3.9 3.1*  CL 97* 95* 95*  CO2 '29 25 27  '$ GLUCOSE 83 92 93  BUN 22 29* 19  CREATININE 5.05* 6.19* 4.08*  CALCIUM 8.3* 8.5* 8.2*   LFT Recent Labs    09/15/21 0010 09/15/21 1928 09/16/21 0921  PROT 7.1  --   --   ALBUMIN 2.3*   < > 2.5*  AST 30  --   --   ALT 12  --   --   ALKPHOS 80  --   --   BILITOT 1.0  --   --    < > = values in this interval not displayed.   PT/INR Recent Labs    09/14/21 2213  LABPROT 13.6  INR 1.1   Hepatitis Panel Recent Labs    09/14/21 1330  HEPBSAG NON REACTIVE    DG Pelvis Portable  Result Date: 09/15/2021 CLINICAL DATA:  Fall with hip pain. EXAM: PORTABLE PELVIS 1-2 VIEWS COMPARISON:  Pelvis radiograph 08/11/2021 FINDINGS: No evidence of acute fracture of the pelvis or hips. Femoral heads remain seated. The bones are subjectively under mineralized. Grossly intact pubic rami. Pubic symphysis and sacroiliac joints are congruent. Advanced vascular calcifications. IMPRESSION: No acute fracture of the pelvis or hips. Electronically Signed   By: Keith Rake M.D.   On: 09/15/2021 00:16   DG  CHEST PORT 1 VIEW  Result Date: 09/15/2021 CLINICAL DATA:  Shortness of breath EXAM: PORTABLE CHEST 1 VIEW COMPARISON:  09/14/2021 FINDINGS: Stable cardiomegaly. Aortic atherosclerosis. Mild pulmonary vascular congestion. Mildly increased perihilar and bibasilar interstitial markings bilaterally. No large pleural fluid collection. No pneumothorax. IMPRESSION: Findings suggestive of CHF with mild interstitial edema. Electronically Signed   By: Davina Poke D.O.   On: 09/15/2021 18:46    Assessment / Plan:  33) 61 year old male with recurrent GI bleeding with acute on chronic anemia requiring numerous hospital admissions. Small bowel enteroscopy 08/24/2021 identified a few duodenal polyps and grade 1 esophageal varices as well as portal  hypertensive gastropathy. Small bowel capsule endoscopy 4/30 with no overt cause for bleeding seen. He had continued melenic stools with hemoglobin dropped to 5.9. Hg 6.2 -> transfused 1 unit of PRBCs 5/20 -> Hg 8.2 -> 7.5 -> today Hg 7.3 -> transfused 1 unit of PRBCs during dialysis. S/P small bowel enteroscopy 09/15/2021 identified 3 bleeding AVMs in the duodenum treated with APC, likely source of upper GI bleeding. Small esophageal varices, portal hypertensive gastropathy and a single gastric polyp were noted. Hemodynamically stable. He passed a small loose black stool last night and a similar small volume of black stool around 2:30pm today.  -Continue to monitor patient closely for active GI bleeding -CBC and BMP in am -Continue PPI IV bid, transition to po bid prior to discharge home  -Our service will sign off at this time, please call us if he demonstrates active GI bleeding or continued drop in his Hg level -He will likely require intermittent PRBC transfusions as an outpatient, defer to his nephrologist and oncologist   2) Cirrhosis with portal hypertension, chronic hepatitis C (untreated) with recent findings of [place] on MRI: Currently undergoing chemotherapy.   3) ESRD on HD MWF. Dialyzed today.     Active Problems:   Acute blood loss anemia   ESRD (end stage renal disease) on dialysis ([place])   Acute lower GI bleeding   Lung mass   Hepatocellular carcinoma (HCC)   GI bleed     LOS: 2 days   Patrecia Pour Kennedy-Smith  09/16/2021, 1:50 PM   Attending physician's note   I have taken a history, reviewed the chart and examined the patient. I performed a substantive portion of this encounter, including complete performance of at least one of the key components, in conjunction with the APP. I agree with the APP's note, impression and recommendations.    61 year old male with chronic hep C and EtOH cirrhosis, hepatocellular carcinoma and end-stage renal disease on hemodialysis MELD-Na  score: 25 at 09/16/2021  9:21 AM  Chronic iron deficiency anemia due to chronic GI blood loss, small bowel AVMs s/p enteroscopy with APC of duodenal angiectasia's yesterday  Monitor hemoglobin and transfuse as needed Patient will benefit from routine IV Feraheme infusions to prevent iron deficiency and erythropoietin given end-stage renal disease  Complains of severe right shoulder pain, if persistent consider further imaging to exclude metastatic disease.  He has multilevel degenerative disease of thoracic spine Consider palliative care consult  GI is available if needed, please call with any questions.  We will sign off  The patient was provided an opportunity to ask questions and all were answered. The patient agreed with the plan and demonstrated an understanding of the instructions.   Damaris Hippo , MD [phone]

## 2021-09-16 NOTE — Progress Notes (Signed)
PT Cancellation Note  Patient Details Name: Daniel [name] MRN: [id number] DOB: 1960/11/14   Cancelled Treatment:    Reason Eval/Treat Not Completed: Patient at procedure or test/unavailable. Pt currently in HD. Will check back as schedule allows to initiate PT evaluation.    Thelma Comp 09/16/2021, 8:47 AM  Rolinda Roan, PT, DPT [facility] Secure Chat Preferred Office: [phone]

## 2021-09-16 NOTE — Progress Notes (Addendum)
[facility] Progress Note   Subjective:   Patient seen and examined at bedside in dialysis.  Reports b/l shoulder pain - chronic issue but bothering him a little more today.  Denies CP, SOB, abdominal pain and n/v/d.    Objective Vitals:   09/16/21 0810 09/16/21 0820 09/16/21 0850 09/16/21 0930  BP:  (!) 162/94 (!) 172/94 (!) 167/94  Pulse:      Resp: '18 17 18 17  '$ Temp:      TempSrc:      SpO2:      Weight:      Height:       Physical Exam General:chronically ill appearing male in NAD Heart:RRR, no mrg Lungs:CTAB, nml WOB on RA Abdomen:soft, NTND Extremities:trace RLE edema, L AKA Dialysis Access: LUE AVF +b/t   Filed Weights   09/14/21 1745 09/14/21 2046 09/16/21 0800  Weight: 107.8 kg 116 kg 112.9 kg    Intake/Output Summary (Last 24 hours) at 09/16/2021 0958 Last data filed at 09/16/2021 0033 Gross per 24 hour  Intake 240 ml  Output 551 ml  Net -311 ml    Additional Objective Labs: Basic Metabolic Panel: Recent Labs  Lab 09/14/21 1049 09/15/21 0010 09/15/21 1928  NA 138 136 131*  K 4.3 3.4* 3.9  CL 98 97* 95*  CO2 '23 29 25  '$ GLUCOSE 124* 83 92  BUN 62* 22 29*  CREATININE 10.29* 5.05* 6.19*  CALCIUM 8.8* 8.3* 8.5*  PHOS  --   --  5.0*   Liver Function Tests: Recent Labs  Lab 09/14/21 1049 09/15/21 0010 09/15/21 1928  AST 33 30  --   ALT 14 12  --   ALKPHOS 87 80  --   BILITOT 1.2 1.0  --   PROT 7.4 7.1  --   ALBUMIN 2.5* 2.3* 2.6*   CBC: Recent Labs  Lab 09/14/21 1049 09/14/21 2213 09/15/21 0010 09/15/21 1814 09/15/21 2142 09/16/21 0820  WBC 5.0 5.2 5.1  --  6.0 5.6  HGB 5.9* 6.2* 6.2* 8.2* 7.5* 7.3*  HCT 18.2* 18.5* 19.0* 25.0* 22.6* 22.6*  MCV 108.3* 101.6* 102.7*  --  103.7* 103.2*  PLT 131* 127* 123*  --  134* 129*   CBG: Recent Labs  Lab 09/15/21 1342  GLUCAP 79    Studies/Results: DG Pelvis Portable  Result Date: 09/15/2021 CLINICAL DATA:  Fall with hip pain. EXAM: PORTABLE PELVIS 1-2 VIEWS COMPARISON:   Pelvis radiograph 08/11/2021 FINDINGS: No evidence of acute fracture of the pelvis or hips. Femoral heads remain seated. The bones are subjectively under mineralized. Grossly intact pubic rami. Pubic symphysis and sacroiliac joints are congruent. Advanced vascular calcifications. IMPRESSION: No acute fracture of the pelvis or hips. Electronically Signed   By: Keith Rake M.D.   On: 09/15/2021 00:16   DG CHEST PORT 1 VIEW  Result Date: 09/15/2021 CLINICAL DATA:  Shortness of breath EXAM: PORTABLE CHEST 1 VIEW COMPARISON:  09/14/2021 FINDINGS: Stable cardiomegaly. Aortic atherosclerosis. Mild pulmonary vascular congestion. Mildly increased perihilar and bibasilar interstitial markings bilaterally. No large pleural fluid collection. No pneumothorax. IMPRESSION: Findings suggestive of CHF with mild interstitial edema. Electronically Signed   By: Davina Poke D.O.   On: 09/15/2021 18:46   DG Chest Portable 1 View  Result Date: 09/14/2021 CLINICAL DATA:  Shortness of breath EXAM: PORTABLE CHEST 1 VIEW COMPARISON:  08/16/2021 FINDINGS: Transverse diameter of heart is increased. Central pulmonary vessels are prominent. Increased interstitial markings are seen in the parahilar regions and lower lung fields. There  is hazy homogeneous opacity in the right parahilar region. Lateral CP angles are indistinct. There is no pneumothorax. There is vascular stent in the left axilla. IMPRESSION: Cardiomegaly. Central pulmonary vessels are prominent suggesting possible mild CHF. Increased interstitial markings are seen in the parahilar regions and lower lung fields suggesting mild interstitial edema or interstitial pneumonia. There is hazy homogeneous opacity in the right parahilar region which may suggest loculated effusion in the interlobar fissure or subsegmental atelectasis. Electronically Signed   By: Elmer Picker M.D.   On: 09/14/2021 12:52    Medications:   albuterol  2.5 mg Nebulization Once    aspirin EC  81 mg Oral Daily   atorvastatin  40 mg Oral Daily   calcium acetate (Phos Binder)  667 mg Oral TID WC   Chlorhexidine Gluconate Cloth  6 each Topical Q0600   furosemide  80 mg Intravenous Once   gabapentin  100 mg Oral TID   pantoprazole (PROTONIX) IV  40 mg Intravenous Q12H    Dialysis Orders: SW MWF  4h 450/500  EDW 112kg  2/2 bath  LUA AVF   Hep none Na thiosulfate - 25g IV qHD No ESA   Assessment/Plan: Recurrent GI bleed/acute on chronic anemia - sp recent EGD that showed portal hypertensive gastropathy in the entire examined stomach and grade I varices in the distal esophagus. EGD yesterday with small esophageal varices, portal hypertensive gastropathy, single gastric polyp and 3 bleeding angiectasias in duodenum s/p APC. +Bx for H. Pylori testing. Plan for PPI & await Bx results. Hgb slightly lower than yesterday at 7.3 this AM.  S/p 1 unit pRBC on 5/20, plan for additional 1 unit to be given with HD today.  No ESA d/t lung mass.  ESRD - on HD MWF. Missed HD x 2 last week. HD today per regular schedule.  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 Continue binders. Not on VDRA. HTN/volume - BP elevated today.  Continue home meds.  CXR yesterday with evidence of volume overload, 3L UF with last HD and tolerating additional 3L UF with HD today.  Calciphylaxis - Hx penile calciphylaxis.  Continue Na thiosulfate.  Lung mass/Hepatocellular carcinoma- followed by Oncology. Holding ESA Nutrition - Renal diet w/fluid restrictions  Jen Mow, PA-C [place] 09/16/2021,9:58 AM  LOS: 2 days

## 2021-09-16 NOTE — Plan of Care (Signed)

## 2021-09-17 ENCOUNTER — Encounter: Payer: Self-pay | Admitting: Internal Medicine

## 2021-09-17 ENCOUNTER — Inpatient Hospital Stay (HOSPITAL_COMMUNITY): Payer: Medicare Other

## 2021-09-17 DIAGNOSIS — R5383 Other fatigue: Secondary | ICD-10-CM | POA: Diagnosis not present

## 2021-09-17 DIAGNOSIS — C220 Liver cell carcinoma: Secondary | ICD-10-CM | POA: Diagnosis not present

## 2021-09-17 DIAGNOSIS — N186 End stage renal disease: Secondary | ICD-10-CM | POA: Diagnosis not present

## 2021-09-17 DIAGNOSIS — D62 Acute posthemorrhagic anemia: Secondary | ICD-10-CM | POA: Diagnosis not present

## 2021-09-17 LAB — CBC WITH DIFFERENTIAL/PLATELET
Abs Immature Granulocytes: 0.03 10*3/uL (ref 0.00–0.07)
Basophils Absolute: 0 10*3/uL (ref 0.0–0.1)
Basophils Relative: 0 %
Eosinophils Absolute: 0.2 10*3/uL (ref 0.0–0.5)
Eosinophils Relative: 3 %
HCT: 25.7 % — ABNORMAL LOW (ref 39.0–52.0)
Hemoglobin: 8.4 g/dL — ABNORMAL LOW (ref 13.0–17.0)
Immature Granulocytes: 1 %
Lymphocytes Relative: 19 %
Lymphs Abs: 1 10*3/uL (ref 0.7–4.0)
MCH: 32.3 pg (ref 26.0–34.0)
MCHC: 32.7 g/dL (ref 30.0–36.0)
MCV: 98.8 fL (ref 80.0–100.0)
Monocytes Absolute: 0.7 10*3/uL (ref 0.1–1.0)
Monocytes Relative: 12 %
Neutro Abs: 3.6 10*3/uL (ref 1.7–7.7)
Neutrophils Relative %: 65 %
Platelets: 129 10*3/uL — ABNORMAL LOW (ref 150–400)
RBC: 2.6 MIL/uL — ABNORMAL LOW (ref 4.22–5.81)
RDW: 22.1 % — ABNORMAL HIGH (ref 11.5–15.5)
WBC: 5.4 10*3/uL (ref 4.0–10.5)
nRBC: 0.4 % — ABNORMAL HIGH (ref 0.0–0.2)

## 2021-09-17 LAB — TYPE AND SCREEN
ABO/RH(D): B POS
Antibody Screen: NEGATIVE
Unit division: 0
Unit division: 0
Unit division: 0
Unit division: 0
Unit division: 0

## 2021-09-17 LAB — BPAM RBC
Blood Product Expiration Date: 202305222359
Blood Product Expiration Date: 202305222359
Blood Product Expiration Date: 202306062359
Blood Product Expiration Date: 202306062359
Blood Product Expiration Date: 202306082359
ISSUE DATE / TIME: 202305201222
ISSUE DATE / TIME: 202305211010
ISSUE DATE / TIME: 202305221047
Unit Type and Rh: 7300
Unit Type and Rh: 7300
Unit Type and Rh: 7300
Unit Type and Rh: 7300
Unit Type and Rh: 7300

## 2021-09-17 LAB — BASIC METABOLIC PANEL
Anion gap: 8 (ref 5–15)
BUN: 14 mg/dL (ref 8–23)
CO2: 27 mmol/L (ref 22–32)
Calcium: 8.4 mg/dL — ABNORMAL LOW (ref 8.9–10.3)
Chloride: 92 mmol/L — ABNORMAL LOW (ref 98–111)
Creatinine, Ser: 4.06 mg/dL — ABNORMAL HIGH (ref 0.61–1.24)
GFR, Estimated: 16 mL/min — ABNORMAL LOW (ref >60)
Glucose, Bld: 91 mg/dL (ref 70–99)
Potassium: 3.7 mmol/L (ref 3.5–5.1)
Sodium: 127 mmol/L — ABNORMAL LOW (ref 135–145)

## 2021-09-17 LAB — HEPATITIS B SURFACE ANTIBODY, QUANTITATIVE: Hep B S AB Quant (Post): 38.1 m[IU]/mL (ref >9.9)

## 2021-09-17 LAB — SURGICAL PATHOLOGY

## 2021-09-17 MED ORDER — AMLODIPINE BESYLATE 5 MG PO TABS
5.0000 mg | ORAL_TABLET | Freq: Every day | ORAL | Status: DC
  Administered 2021-09-17 – 2021-09-18 (×2): 5 mg via ORAL
  Filled 2021-09-17 (×2): qty 1

## 2021-09-17 MED ORDER — CHLORHEXIDINE GLUCONATE CLOTH 2 % EX PADS
6.0000 | MEDICATED_PAD | Freq: Every day | CUTANEOUS | Status: DC
  Administered 2021-09-17 – 2021-09-18 (×2): 6 via TOPICAL

## 2021-09-17 MED ORDER — MORPHINE SULFATE (PF) 2 MG/ML IV SOLN: 1.0000 mg | INTRAVENOUS | Status: DC | PRN

## 2021-09-17 MED ORDER — AMLODIPINE BESYLATE 2.5 MG PO TABS: 2.5000 mg | ORAL_TABLET | Freq: Every day | ORAL | Status: DC

## 2021-09-17 MED ORDER — HYDROCODONE-ACETAMINOPHEN 5-325 MG PO TABS
2.0000 | ORAL_TABLET | ORAL | Status: DC | PRN
  Administered 2021-09-17 – 2021-09-18 (×2): 2 via ORAL
  Filled 2021-09-17 (×2): qty 2

## 2021-09-17 NOTE — Progress Notes (Signed)
Pt receives out-pt HD at [facility] on MWF. Pt arrives at 10:50 for 11:15 chair time. Will assist as needed.   Melven Sartorius Renal Navigator [phone]

## 2021-09-17 NOTE — Progress Notes (Signed)
Recall placed per Dr. Libby Maw request. Letter sent via My Chart and mailed to home address.

## 2021-09-17 NOTE — TOC Initial Note (Signed)
Transition of Care [facility]) - Initial/Assessment Note    Patient Details  Name: Daniel [name] MRN: [id number] Date of Birth: 30-Aug-1960  Transition of Care [facility]) CM/SW Contact:    Marilu Favre, RN Phone Number: 09/17/2021, 3:13 PM  Clinical Narrative:                 Spoke to patient at bedside . Patient from home alone.   PCP is [name]   Patient uses Paediatric nurse on [employer] for pharmacy.   Patient drives himself to appointments and can get prescriptions filled.   PAtient states he has friends who live close by if he needs assistance.   Expected Discharge Plan: Home/Self Care Barriers to Discharge: Continued Medical Work up   Patient Goals and CMS Choice Patient states their goals for this hospitalization and ongoing recovery are:: to return to home      Expected Discharge Plan and Services Expected Discharge Plan: Home/Self Care     Post Acute Care Choice: NA Living arrangements for the past 2 months: Single Family Home                 DME Arranged: N/A DME Agency: NA       HH Arranged: NA          Prior Living Arrangements/Services Living arrangements for the past 2 months: Single Family Home Lives with:: Self Patient language and need for interpreter reviewed:: Yes        Need for Family Participation in Patient Care: No (Comment)        Activities of Daily Living Home Assistive Devices/Equipment: Cane (specify quad or straight), Eyeglasses, Walker (specify type), Wheelchair ADL Screening (condition at time of admission) Patient's cognitive ability adequate to safely complete daily activities?: Yes Is the patient deaf or have difficulty hearing?: No Does the patient have difficulty seeing, even when wearing glasses/contacts?: No Does the patient have difficulty concentrating, remembering, or making decisions?: No Patient able to express need for assistance with ADLs?: Yes Does the patient have difficulty dressing or  bathing?: No Independently performs ADLs?: No Communication: Independent Dressing (OT): Needs assistance Is this a change from baseline?: Pre-admission baseline Grooming: Needs assistance Is this a change from baseline?: Pre-admission baseline Feeding: Independent Bathing: Needs assistance Is this a change from baseline?: Pre-admission baseline Toileting: Needs assistance Is this a change from baseline?: Pre-admission baseline In/Out Bed: Needs assistance Is this a change from baseline?: Pre-admission baseline Walks in Home: Dependent Is this a change from baseline?: Pre-admission baseline Does the patient have difficulty walking or climbing stairs?: Yes Weakness of Legs: Left Weakness of Arms/Hands: None  Permission Sought/Granted   Permission granted to share information with : No              Emotional Assessment Appearance:: Appears stated age Attitude/Demeanor/Rapport: Engaged Affect (typically observed): Accepting Orientation: : Oriented to Self, Oriented to Place, Oriented to  Time, Oriented to Situation Alcohol / Substance Use: Not Applicable Psych Involvement: No (comment)  Admission diagnosis:  GI bleed [K92.2] Exertional shortness of breath [R06.02] Symptomatic anemia [D64.9] Gastrointestinal hemorrhage, unspecified gastrointestinal hemorrhage type [K92.2] Fatigue, unspecified type [R53.83] Patient Active Problem List   Diagnosis Date Noted   GI bleed 08/23/2021   Amputation stump infection ([place])    Sepsis ([place]) 08/16/2021   Right foot pain 08/07/2021   Lung mass 08/06/2021   Hepatocellular carcinoma ([place]) 08/06/2021   Melena    Pressure injury of skin 07/05/2021   Right leg pain  07/04/2021   GI bleeding 07/04/2021   Acute lower GI bleeding 07/03/2021   Chronic diastolic CHF (congestive heart failure) ([place]) 06/14/2021   GERD (gastroesophageal reflux disease) 06/14/2021   Chest pain 06/13/2021   Diabetic foot infection ([place]) 04/14/2021   PAD  (peripheral artery disease) ([place]) 04/08/2021   Gastritis and gastroduodenitis    ESRD (end stage renal disease) on dialysis ([place]) 03/25/2021   Diabetes mellitus type 2, controlled, with complications ([place]) 70/48/8891   QT prolongation 03/25/2021   Alcohol abuse with intoxication ([place]) 03/25/2021   Cocaine abuse ([place]) 03/25/2021   Tobacco use 03/25/2021   Class 2 obesity due to excess calories with body mass index (BMI) of 39.0 to 39.9 in adult 03/25/2021   AVM (arteriovenous malformation) of duodenum, acquired    Peptic ulcer disease    Upper GI bleed 03/24/2021   Chalazion of right upper eyelid 03/24/2021   Thrombocytopenia ([place]) 03/24/2021   Visual hallucination 03/24/2021   Bilateral leg edema 03/24/2021   Acute blood loss anemia    Benign neoplasm of duodenum, jejunum, and ileum    Anemia due to chronic kidney disease 02/23/2021   Prolonged QT interval 02/23/2021   Rotator cuff tear arthropathy of right shoulder 01/23/2021   Alcohol dependence ([place]) 01/13/2021   Gastric ulcer with hemorrhage 01/13/2021   Generalized weakness    Transaminitis 01/10/2021   History of alcohol abuse 10/27/2020   History of cocaine abuse (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   AVM (arteriovenous malformation) of small bowel, acquired with hemorrhage    Symptomatic anemia 07/31/2019   Chronic hepatitis C without hepatic coma ([place]) 07/11/2019   Iron deficiency anemia 03/30/2019   Alcohol use disorder 03/30/2019   B12 deficiency 03/30/2019   Orthostatic hypotension 01/22/2019   Anemia    Essential hypertension 06/29/2016   DM2 (diabetes mellitus, type 2) ([place]) 06/29/2016   PCP:  [name], NP Pharmacy:   [facility] [place]  [address] Phone: [phone] Fax: [phone]     Social Determinants of Health (SDOH) Interventions    Readmission Risk Interventions    02/27/2021   12:51 PM 07/07/2019    4:03 PM  Readmission Risk Prevention Plan  Transportation Screening Complete Complete  PCP or Specialist Appt within 3-5 Days  Complete  HRI or [place]  Complete  Social Work Consult for [place] Planning/Counseling  Complete  Palliative Care Screening  Not Applicable  Medication Review Press photographer) Complete Complete  PCP or Specialist appointment within 3-5 days of discharge Complete   HRI or [place] Complete   SW Recovery Care/Counseling Consult Complete   [place] Not Applicable

## 2021-09-17 NOTE — Evaluation (Signed)
Physical Therapy Evaluation Patient Details Name: Daniel [name] MRN: [id number] DOB: 10/04/1960 Today's Date: 09/17/2021  History of Present Illness  Daniel [name] is a 61 y.o. 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 s/p eneroscopy 09/16/21   Clinical Impression  Patient requires encouragement for PT assessment, but with encouragement agrees to demonstrate squat pivot transfer. He does so with mod independence and ease. Patient appears to be at baseline level of mobility. No further skilled PT needs at this time. Will sign off.        Recommendations for follow up therapy are one component of a multi-disciplinary discharge planning process, led by the attending physician.  Recommendations may be updated based on patient status, additional functional criteria and insurance authorization.  Follow Up Recommendations No PT follow up    Assistance Recommended at Discharge Intermittent Supervision/Assistance  Patient can return home with the following  A little help with walking and/or transfers;A little help with bathing/dressing/bathroom;Assist for transportation;Assistance with cooking/housework;Direct supervision/assist for financial management;Direct supervision/assist for medications management    Equipment Recommendations None recommended by PT  Recommendations for Other Services       Functional Status Assessment Patient has not had a recent decline in their functional status     Precautions / Restrictions Precautions Precautions: Fall Restrictions Weight Bearing Restrictions: No      Mobility  Bed Mobility Overal bed mobility: Modified Independent                   Transfers Overall transfer level: Modified independent                 General transfer comment: demonstrates transfer bed><chair with mod independence    Ambulation/Gait               General Gait Details: not ambulatory at baseline per patient  Stairs            Wheelchair Mobility    Modified Rankin (Stroke Patients Only)       Balance Overall balance assessment: Modified Independent                                           Pertinent Vitals/Pain Pain Assessment Pain Assessment: Faces Faces Pain Scale: Hurts little more Pain Location: B shoulders Pain Descriptors / Indicators: Discomfort, Sore    Home Living Family/patient expects to be discharged to:: Private residence Living Arrangements: Alone Available Help at Discharge: Family;Available PRN/intermittently Type of Home: House Home Access: Ramped entrance         Home Equipment: Wheelchair - manual Additional Comments: lives in [place], ramp at back to enter. Drives. uses scooter at store.    Prior Function Prior Level of Function : Needs assist;Driving             Mobility Comments: transfers only no ambulation per patient ADLs Comments: set up assist     Hand Dominance   Dominant Hand: Right    Extremity/Trunk Assessment   Upper Extremity Assessment Upper Extremity Assessment: RUE deficits/detail;LUE deficits/detail RUE Deficits / Details: B UE shoulder pain. Limited ROM LUE Deficits / Details:  B shoulder pain, limited ROM    Lower Extremity Assessment Lower Extremity Assessment: Overall WFL for tasks assessed    Cervical / Trunk Assessment Cervical / Trunk Assessment: Normal  Communication   Communication: No difficulties  Cognition Arousal/Alertness: Awake/alert Behavior During Therapy: WFL for tasks assessed/performed Overall Cognitive Status: Within Functional Limits for tasks assessed                                           General Comments      Exercises     Assessment/Plan    PT Assessment Patient does not need any further PT services  PT Problem List         PT Treatment Interventions      PT Goals (Current goals can be found in the Care Plan section)  Acute Rehab PT Goals Patient Stated Goal: return to boarding house PT Goal Formulation: With patient Time For Goal Achievement: 09/24/21 Potential to Achieve Goals: Good    Frequency       Co-evaluation               AM-PAC PT "6 Clicks" Mobility  Outcome Measure Help needed turning from your back to your side while in a flat bed without using bedrails?: None   Help needed moving to and from a bed to a chair (including a wheelchair)?: None Help needed standing up from a chair using your arms (e.g., wheelchair or bedside chair)?: None Help needed to walk in hospital room?: Total Help needed climbing 3-5 steps with a railing? : Total 6 Click Score: 14    End of Session   Activity Tolerance: Patient tolerated treatment well Patient left: in bed Nurse Communication: Mobility status      Time: 1440-1450 PT Time Calculation (min) (ACUTE ONLY): 10 min   Charges:   PT Evaluation $PT Eval Low Complexity: 1 Low          Eleana Tocco, PT, GCS 09/17/21,2:55 PM

## 2021-09-17 NOTE — Consult Note (Signed)
H  Primary Care Provider:  Ryan  Patient is currently active with [facility] for chronic disease management services.  Patient has been engaged by a [facility] NP-G.  Our community based plan of care has focused on disease management and community resource support. There has been difficulty for the NP-G to maintain contact with the patient.  1:08 pm Met with the patient at the bedside.  Patient states he has been "having it pretty rough." He states on top of his current medical situation he has cancer.  He states he has had one treatment and suppose to go for more treatments.  He states he drives to appointments but he is having to move out of his rooming house by the end of the month with no place to go. Updated SDOH on housing. Checked patient's contact on phone information.  He states his phone is good and he has voicemail but he left his phone in his car and one of the hospital staff was going to get his phone out, he had given her the keys.  He did not know the name of the person here.  Plan: Continue to follow and make appropriate referrals regarding needs. Inpatient Transition Of Care [TOC] team member to make aware that [facility] following.   Of note, [facility] Care Management services does not replace or interfere with any services that are needed or arranged by inpatient [facility] care management team.  For additional questions or referrals please contact:  Natividad Brood, RN BSN [facility] Liaison  [phone] business mobile phone Toll free office [phone]  Fax number: [phone] [email or web address]

## 2021-09-17 NOTE — Progress Notes (Signed)
[facility]                                                                               Daniel [name], is a 61 y.o. male, DOB - [birth year], JJO:[id number] Admit date - 09/14/2021    Outpatient Primary MD for the patient is Oletta Lamas, Milford Cage, NP  LOS - 3  days    Brief summary   Daniel [name] is a 61 y.o. 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 On last admission, EGD was done which showed no active bleed, and endoscopy was negative for active bleeding.  On discharge on May 2, decision was made to hold Plavix and left on patient on aspirin alone.  GI consulted , pt underwent SBE showing small esophageal varices, portal hypertensive gastropathy,. 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 Treated with APC. Biopsies were taken for H pylori.  Hospital course complicated by bilateral shoulder pain, unable to move, moaning in pain. MRI of the shoulders ordered for further evaluation. In view of his multiple co morbidities, palliative care consulted for goals of care.   Assessment & Plan    Assessment and Plan:  Acute anemia of blood loss  on chronic blood loss anemia:  Possibly from upper GI bleed.  Continue with PPI BID.  GI consulted and underwent SBE done showing small esophageal varices, portal hypertensive gastropathy,. 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 Treated with APC. Biopsies were taken for H pylori.  Transfuse to keep hemoglobin greater than 7.  Pt underwent 3 units of prbc transfusion, repeat hemoglobin around 8 and stable.     Acute on chronic HFpEF : Secondary to missing HD.  Fluid management with HD.     Hepatocellular cancer: Recommend outpatient follow up with Oncology.    Cirrhosis secondary to Hepatitis C and h/o alcohol abuse; Continue to monitor.    Right upper lobe lung mass.  Recommend follow up with oncology.     Hypertension:  Elevated, from pain in the shoulders.  Increased amlodipine to 5 mg daily,  On prn hydralazine.     ESRD on HD.   Nephrology on board.     Hyponatremia From ESRD and fluid overload.    Mild hypokalemia:  No supplementation.  Monitor.    Chronic back pain:  Resume home meds.   RN Pressure Injury Documentation: Pressure Injury 07/04/21 Buttocks Medial Stage 2 -  Partial thickness loss of dermis presenting as a shallow open injury with a red, pink wound bed without slough. two small areas of broken skin in between buttocks. (Active)  07/04/21 0936  Location: Buttocks  Location Orientation: Medial  Staging: Stage 2 -  Partial thickness loss of dermis presenting as a shallow open injury with a red, pink wound bed without slough.  Wound Description (Comments): two small areas of broken skin in between buttocks.  Present on Admission:   Local wound care.  Bilateral shoulder pain;  Severe, not well controlled with vicodin, morphine ordered.  MRI of the shoulders ordered.     Estimated body mass index is 33.64 kg/m as calculated from the following:   Height as of this encounter: '5\' 11"'$  (1.803 m).   Weight as of this encounter: 109.4 kg.  Code Status: full code.  DVT Prophylaxis:  SCDs Start: 09/14/21 1256   Level of Care: Level of care: Telemetry Medical Family Communication: none at bedside.   Disposition Plan:     Remains inpatient appropriate:  HD/ fluid overload. PRBC transfusion.   Procedures:  SBE  HD  Consultants:   NEPHROLOGY GASTROENTEROLOGY.   Antimicrobials:   Anti-infectives (From admission, onward)    None        Medications  Scheduled Meds:  amLODipine  2.5 mg Oral QHS   aspirin EC  81  mg Oral Daily   atorvastatin  40 mg Oral Daily   calcium acetate (Phos Binder)  667 mg Oral TID WC   Chlorhexidine Gluconate Cloth  6 each Topical Q0600   Chlorhexidine Gluconate Cloth  6 each Topical Q0600   gabapentin  100 mg Oral TID   pantoprazole (PROTONIX) IV  40 mg Intravenous Q12H   Continuous Infusions: PRN Meds:.hydrALAZINE, HYDROcodone-acetaminophen, methocarbamol, morphine injection, nitroGLYCERIN, traZODone    Subjective:   [name] was seen and examined today.  Sob resolved. Now severe bilateral shoulder pain.  Objective:   Vitals:   09/16/21 1707 09/16/21 2012 09/17/21 0351 09/17/21 0939  BP: (!) 159/93 (!) 158/86 (!) 164/92 (!) 162/93  Pulse: 88 87 87 86  Resp: '18 19 18 19  '$ Temp: 98 F (36.7 C) 98.1 F (36.7 C) 98.1 F (36.7 C) 97.9 F (36.6 C)  TempSrc: Oral Oral Oral Oral  SpO2: 97% 92% 92% 99%  Weight:      Height:        Intake/Output Summary (Last 24 hours) at 09/17/2021 1211 Last data filed at 09/16/2021 1500 Gross per 24 hour  Intake 0 ml  Output 3500 ml  Net -3500 ml    Filed Weights   09/14/21 2046 09/16/21 0800 09/16/21 1220  Weight: 116 kg 112.9 kg 109.4 kg     Exam General exam: well developed gentleman in mild to mod distress from pain.  Respiratory system: Diminished air entry at bases, on RA.  Cardiovascular system: S1 & S2 heard, RRR. No JVD,  Gastrointestinal system: Abdomen is nondistended, soft and nontender.  Normal bowel sounds heard. Central nervous system: Alert and oriented. No focal neurological deficits. Extremities: bilateral shoulder tenderness. Unable to raise arms.  Skin: No rashes, Psychiatry: anxious.      Data Reviewed:  I have personally reviewed following labs and imaging studies   CBC Lab Results  Component Value Date   WBC 5.4 09/17/2021   RBC 2.60 (L) 09/17/2021   HGB 8.4 (L) 09/17/2021   HCT 25.7 (L) 09/17/2021   MCV 98.8 09/17/2021   MCH 32.3 09/17/2021   PLT 129 (L) 09/17/2021    MCHC 32.7 09/17/2021   RDW 22.1 (H) 09/17/2021   LYMPHSABS 1.0 09/17/2021   MONOABS 0.7 09/17/2021   EOSABS 0.2 09/17/2021   BASOSABS 0.0 43/15/4008     Last metabolic panel Lab Results  Component Value Date   NA 127 (L) 09/17/2021   K 3.7 09/17/2021   CL 92 (L) 09/17/2021   CO2 27 09/17/2021   BUN 14 09/17/2021   CREATININE 4.06 (H) 09/17/2021   GLUCOSE 91 09/17/2021  GFRNONAA 16 (L) 09/17/2021   GFRAA 7 (L) 12/28/2019   CALCIUM 8.4 (L) 09/17/2021   PHOS 2.9 09/16/2021   PROT 7.1 09/15/2021   ALBUMIN 2.5 (L) 09/16/2021   LABGLOB 4.5 12/28/2019   AGRATIO 0.9 (L) 12/28/2019   BILITOT 1.0 09/15/2021   ALKPHOS 80 09/15/2021   AST 30 09/15/2021   ALT 12 09/15/2021   ANIONGAP 8 09/17/2021    CBG (last 3)  Recent Labs    09/15/21 1342  GLUCAP 79       Coagulation Profile: Recent Labs  Lab 09/14/21 2213  INR 1.1      Radiology Studies: DG CHEST PORT 1 VIEW  Result Date: 09/15/2021 CLINICAL DATA:  Shortness of breath EXAM: PORTABLE CHEST 1 VIEW COMPARISON:  09/14/2021 FINDINGS: Stable cardiomegaly. Aortic atherosclerosis. Mild pulmonary vascular congestion. Mildly increased perihilar and bibasilar interstitial markings bilaterally. No large pleural fluid collection. No pneumothorax. IMPRESSION: Findings suggestive of CHF with mild interstitial edema. Electronically Signed   By: Davina Poke D.O.   On: 09/15/2021 18:46   DG Shoulder Left  Result Date: 09/16/2021 CLINICAL DATA:  Left shoulder pain no known trauma. EXAM: LEFT SHOULDER - 2+ VIEW COMPARISON:  None Available. FINDINGS: There is no evidence of fracture or dislocation. Mild degenerative change of the glenohumeral and acromioclavicular joints. Mineralization of the supraspinatus tendon. Vascular stent projects over the left axilla. Vascular calcifications. IMPRESSION: 1. No acute fracture or dislocation. 2. Mild degenerative changes of the glenohumeral and acromioclavicular joints. 3. Calcific  tendinosis of the supraspinatus tendon. Electronically Signed   By: Dahlia Bailiff M.D.   On: 09/16/2021 16:21       Hosie Poisson M.D. [facility] 09/17/2021, 12:11 PM  Available via Epic secure chat 7am-7pm After 7 pm, please refer to night coverage provider listed on amion.

## 2021-09-17 NOTE — Progress Notes (Signed)
[facility] Progress Note   Subjective:   Patient seen and examined at bedside. Continues to have pain in both shoulder but worse on the right.  No other complaints this morning.  Denies CP, SOB, abdominal pain and n/v/d.    Objective Vitals:   09/16/21 1707 09/16/21 2012 09/17/21 0351 09/17/21 0939  BP: (!) 159/93 (!) 158/86 (!) 164/92 (!) 162/93  Pulse: 88 87 87 86  Resp: '18 19 18 19  '$ Temp: 98 F (36.7 C) 98.1 F (36.7 C) 98.1 F (36.7 C) 97.9 F (36.6 C)  TempSrc: Oral Oral Oral Oral  SpO2: 97% 92% 92% 99%  Weight:      Height:       Physical Exam General:chronically ill appearing male in NAD Heart:RRR, no mrg Lungs:mostly CTAB, +scattered wheezing, nml WOB on RA Abdomen:soft, NTND Extremities:no LE edema, L AKA Dialysis Access: LU AVF +b/t   Filed Weights   09/14/21 2046 09/16/21 0800 09/16/21 1220  Weight: 116 kg 112.9 kg 109.4 kg    Intake/Output Summary (Last 24 hours) at 09/17/2021 1101 Last data filed at 09/16/2021 1500 Gross per 24 hour  Intake 415 ml  Output 3500 ml  Net -3085 ml    Additional Objective Labs: Basic Metabolic Panel: Recent Labs  Lab 09/15/21 1928 09/16/21 0921 09/17/21 0314  NA 131* 131* 127*  K 3.9 3.1* 3.7  CL 95* 95* 92*  CO2 '25 27 27  '$ GLUCOSE 92 93 91  BUN 29* 19 14  CREATININE 6.19* 4.08* 4.06*  CALCIUM 8.5* 8.2* 8.4*  PHOS 5.0* 2.9  --    Liver Function Tests: Recent Labs  Lab 09/14/21 1049 09/15/21 0010 09/15/21 1928 09/16/21 0921  AST 33 30  --   --   ALT 14 12  --   --   ALKPHOS 87 80  --   --   BILITOT 1.2 1.0  --   --   PROT 7.4 7.1  --   --   ALBUMIN 2.5* 2.3* 2.6* 2.5*   CBC: Recent Labs  Lab 09/14/21 2213 09/15/21 0010 09/15/21 1814 09/15/21 2142 09/16/21 0820 09/16/21 1750 09/17/21 0314  WBC 5.2 5.1  --  6.0 5.6  --  5.4  NEUTROABS  --   --   --   --   --   --  3.6  HGB 6.2* 6.2*   < > 7.5* 7.3* 8.9* 8.4*  HCT 18.5* 19.0*   < > 22.6* 22.6* 27.1* 25.7*  MCV 101.6* 102.7*  --   103.7* 103.2*  --  98.8  PLT 127* 123*  --  134* 129*  --  129*   < > = values in this interval not displayed.   CBG: Recent Labs  Lab 09/15/21 1342  GLUCAP 79    Studies/Results: DG CHEST PORT 1 VIEW  Result Date: 09/15/2021 CLINICAL DATA:  Shortness of breath EXAM: PORTABLE CHEST 1 VIEW COMPARISON:  09/14/2021 FINDINGS: Stable cardiomegaly. Aortic atherosclerosis. Mild pulmonary vascular congestion. Mildly increased perihilar and bibasilar interstitial markings bilaterally. No large pleural fluid collection. No pneumothorax. IMPRESSION: Findings suggestive of CHF with mild interstitial edema. Electronically Signed   By: Davina Poke D.O.   On: 09/15/2021 18:46   DG Shoulder Left  Result Date: 09/16/2021 CLINICAL DATA:  Left shoulder pain no known trauma. EXAM: LEFT SHOULDER - 2+ VIEW COMPARISON:  None Available. FINDINGS: There is no evidence of fracture or dislocation. Mild degenerative change of the glenohumeral and acromioclavicular joints. Mineralization of the supraspinatus tendon.  Vascular stent projects over the left axilla. Vascular calcifications. IMPRESSION: 1. No acute fracture or dislocation. 2. Mild degenerative changes of the glenohumeral and acromioclavicular joints. 3. Calcific tendinosis of the supraspinatus tendon. Electronically Signed   By: Dahlia Bailiff M.D.   On: 09/16/2021 16:21    Medications:   aspirin EC  81 mg Oral Daily   atorvastatin  40 mg Oral Daily   calcium acetate (Phos Binder)  667 mg Oral TID WC   Chlorhexidine Gluconate Cloth  6 each Topical Q0600   gabapentin  100 mg Oral TID   pantoprazole (PROTONIX) IV  40 mg Intravenous Q12H    Dialysis Orders: SW MWF  4h 450/500  EDW 112kg  2/2 bath  LUA AVF   Hep none Na thiosulfate - 25g IV qHD No ESA     Assessment/Plan: Recurrent GI bleed/acute on chronic anemia - sp recent EGD that showed portal hypertensive gastropathy in the entire examined stomach and grade I varices in the distal  esophagus. 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 +Bx for H. Pylori testing. Plan for PPI & await Bx results. Hgb^ 8.4 s/p 1 unit pRBC yesterday and 1 unit pRBC on 5/20. No ESA d/t lung mass.  ESRD - on HD MWF. Missed HD x 2 last week. HD tomorrow per regular schedule.  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 Continue binders. Not on VDRA. HTN/volume - BP elevated today.  Continue home meds - amlodipine 2.'5mg'$  qhs.  Hydralazine '25mg'$  prn also ordered. CXR 5/21 with evidence of volume overload, net UF 6.5L over the last 2 treatments. Under dry weight per bed weights, needs to be lowered on d/c.  Continue to challenge and UF as tolerated.  Calciphylaxis - Hx penile calciphylaxis.  Continue Na thiosulfate.  Lung mass/Hepatocellular carcinoma- followed by Oncology. Holding ESA Nutrition - Renal diet w/fluid restrictions  Jen Mow, PA-C [facility] 09/17/2021,11:01 AM  LOS: 3 days

## 2021-09-17 NOTE — Consult Note (Incomplete)
Consultation Note Date: 09/17/2021   Patient Name: Daniel [name]  DOB: Jul 02, 1960  MRN: [id number]  Age / Sex: 61 y.o., male  PCP: [name], NP Referring Physician: Hosie Poisson, MD  Reason for Consultation: Establishing goals of care and Psychosocial/spiritual support  HPI/Patient Profile: 61 y.o. male   admitted on 09/14/2021 with   history of DM, PAD, ESRD on HD, HFpEF, diverticulosis, AVMs presents with melena.  He is only on a baby aspirin daily.  Not currently on any Plavix.  Patient presents with a drop in his hemoglobin from 7.1 about 2 weeks ago to 5.9 upon arrival here.  His last SBE 08/24/21 showed grade 1 esophageal varices and PHG.  VCE 08/25/21 showed small red spot versus diminutive AVM in the small bowel but no significant hematin anywhere.  We will plan for SBE tomorrow for further evaluation.  Continue PPI twice daily.     Clinical Assessment and Goals of Care: *** This NP Wadie Lessen reviewed medical records, received report from team, assessed the patient and then meet at the patient's bedside  to discuss diagnosis, prognosis, GOC, EOL wishes disposition and options.   Concept of Palliative Care was introduced as specialized medical care for people and their families living with serious illness.  If focuses on providing relief from the symptoms and stress of a serious illness.  The goal is to improve quality of life for both the patient and the family.  Created space and opportunity for patient  and family to explore thoughts and feelings regarding current medical situation     A  discussion was had today regarding advanced directives.  Concepts specific to code status, artifical feeding and hydration, continued IV antibiotics and rehospitalization was had.  The difference between a aggressive medical intervention path  and a palliative comfort care path for this patient at  this time was had.  Values and goals of care important to patient and family were attempted to be elicited.   MOST form    Natural trajectory and expectations at EOL were discussed.  Questions and concerns addressed.  Patient  encouraged to call with questions or concerns.     PMT will continue to support holistically.              {Primary Decision HUDJS:97026}    SUMMARY OF RECOMMENDATIONS   *** Code Status/Advance Care Planning: {Palliative Code status:23503}   Symptom Management:  ***  Palliative Prophylaxis:  {Palliative Prophylaxis:21015}  Additional Recommendations (Limitations, Scope, Preferences): {Recommended Scope and Preferences:21019}  Psycho-social/Spiritual:  Desire for further Chaplaincy support:{YES NO:22349} Additional Recommendations: {PAL SOCIAL:21064}  Prognosis:  {Palliative Care Prognosis:23504}  Discharge Planning: {Palliative dispostion:23505}      Primary Diagnoses: Present on Admission:  Acute blood loss anemia  Acute lower GI bleeding  Hepatocellular carcinoma (HCC)  Lung mass  GI bleed   I have reviewed the medical record, interviewed the patient and family, and examined the patient. The following aspects are pertinent.  Past Medical History:  Diagnosis Date   Anemia  Diabetes mellitus without complication [facility])    patient denies   Dialysis patient [facility])    End stage chronic kidney disease ([place])    Hypertension    ICH (intracerebral hemorrhage) ([place]) 05/20/2017   PAD (peripheral artery disease) ([facility])    Shoulder pain, left 06/28/2013   Social History   Socioeconomic History   Marital status: Single    Spouse name: Not on file   Number of children: 2   Years of education: 12   Highest education level: 12th grade  Occupational History   Occupation: disabled  Tobacco Use   Smoking status: Every Day    Packs/day: 0.50    Years: 45.00    Pack years: 22.50    Types: Cigarettes   Smokeless tobacco: Never    Tobacco comments:    Pt left before information given  Vaping Use   Vaping Use: Never used  Substance and Sexual Activity   Alcohol use: Not Currently   Drug use: Not Currently    Types: "Crack" cocaine    Comment: last in 2020   Sexual activity: Yes    Birth control/protection: None  Other Topics Concern   Not on file  Social History Narrative   Goes to dialysis M-W-F, [facility] using a wheelchair at present, waiting for prosthetic. Looking for handicapped housing for immediate move in.   Social Determinants of Health   Financial Resource Strain: Not on file  Food Insecurity: No Food Insecurity   Worried About Charity fundraiser in the Last Year: Never true   Ran Out of Food in the Last Year: Never true  Transportation Needs: No Transportation Needs   Lack of Transportation (Medical): No   Lack of Transportation (Non-Medical): No  Physical Activity: Not on file  Stress: Not on file  Social Connections: Not on file   Family History  Problem Relation Age of Onset   Colon cancer Neg Hx    Scheduled Meds:  amLODipine  2.5 mg Oral QHS   aspirin EC  81 mg Oral Daily   atorvastatin  40 mg Oral Daily   calcium acetate (Phos Binder)  667 mg Oral TID WC   Chlorhexidine Gluconate Cloth  6 each Topical Q0600   Chlorhexidine Gluconate Cloth  6 each Topical Q0600   gabapentin  100 mg Oral TID   pantoprazole (PROTONIX) IV  40 mg Intravenous Q12H   Continuous Infusions: PRN Meds:.hydrALAZINE, HYDROcodone-acetaminophen, methocarbamol, morphine injection, nitroGLYCERIN, traZODone Medications Prior to Admission:  Prior to Admission medications   Medication Sig Start Date End Date Taking? Authorizing Provider  amLODipine (NORVASC) 2.5 MG tablet Take 1 tablet (2.5 mg total) by mouth at bedtime. Patient taking differently: Take 2.5 mg by mouth daily. 08/10/21  Yes Bonnielee Haff, MD  aspirin 81 MG EC tablet Take 1 tablet (81 mg total) by mouth daily. Swallow whole. Patient taking  differently: Take 81 mg by mouth daily. 08/10/21  Yes Bonnielee Haff, MD  atorvastatin (LIPITOR) 40 MG tablet Take 1 tablet (40 mg total) by mouth daily. 08/20/21  Yes Baglia, Corrina, PA-C  calcium acetate, Phos Binder, (PHOSLYRA) 667 MG/5ML SOLN Take 667 mg by mouth 3 (three) times daily with meals.   Yes [provider]  gabapentin (NEURONTIN) 100 MG capsule Take 1 capsule (100 mg total) by mouth 3 (three) times daily. 08/10/21  Yes Bonnielee Haff, MD  HYDROcodone-acetaminophen (NORCO/VICODIN) 5-325 MG tablet Take 1 tablet by mouth every 4 (four) hours as needed for severe pain. 08/27/21  Yes [place] Pink,  Gokul, MD  methocarbamol (ROBAXIN) 500 MG tablet Take 1 tablet (500 mg total) by mouth every 6 (six) hours as needed for muscle spasms. 08/27/21  Yes Bonnielee Haff, MD  pantoprazole (PROTONIX) 40 MG tablet Take 1 tablet (40 mg total) by mouth 2 (two) times daily before a meal. 08/27/21  Yes Bonnielee Haff, MD  traZODone (DESYREL) 50 MG tablet Take 0.5-1 tablets (25-50 mg total) by mouth at bedtime as needed for sleep. Patient taking differently: Take 50 mg by mouth at bedtime as needed for sleep. 09/03/21  Yes [name], NP  Darbepoetin Alfa (ARANESP) 100 MCG/0.5ML SOSY injection Inject 0.5 mLs (100 mcg total) into the vein every 7 (seven) days for 8 doses. Patient not taking: Reported on 09/15/2021 08/19/21 10/08/21  Dana Allan I, MD  colchicine 0.6 MG tablet Take 0.5 tablets (0.3 mg total) by mouth 2 (two) times daily. 07/24/20 07/29/20  Noemi Chapel, MD  furosemide (LASIX) 40 MG tablet Take 1 tablet (40 mg total) by mouth daily. 07/25/19 02/09/20  Ladona Horns, MD   Allergies  Allergen Reactions   Seroquel [Quetiapine] Other (See Comments)    Tardive kinesia/dystonia   Dilaudid [Hydromorphone Hcl] Itching and Other (See Comments)    Pt reports itchiness after IM injection    Review of Systems  Physical Exam  Vital Signs: BP (!) 162/93 (BP Location: Right Arm)   Pulse 86    Temp 97.9 F (36.6 C) (Oral)   Resp 19   Ht '5\' 11"'$  (1.803 m)   Wt 109.4 kg   SpO2 99%   BMI 33.64 kg/m  Pain Scale: 0-10 POSS *See Group Information*: 1-Acceptable,Awake and alert Pain Score: 4    SpO2: SpO2: 99 % O2 Device:SpO2: 99 % O2 Flow Rate: .O2 Flow Rate (L/min): 3 L/min  IO: Intake/output summary:  Intake/Output Summary (Last 24 hours) at 09/17/2021 1234 Last data filed at 09/16/2021 1500 Gross per 24 hour  Intake 0 ml  Output --  Net 0 ml    LBM: Last BM Date : 09/16/21 Baseline Weight: Weight: 109.9 kg Most recent weight: Weight: 109.4 kg     Palliative Assessment/Data:     Time In: *** Time Out: *** Time Total: *** Greater than 50%  of this time was spent counseling and coordinating care related to the above assessment and plan.  Signed by: Wadie Lessen, NP   Please contact Palliative Medicine Team phone at [phone] for questions and concerns.  For individual provider: See [name]

## 2021-09-17 NOTE — Care Management Important Message (Signed)
Important Message  Patient Details  Name: Daniel [name] MRN: [id number] Date of Birth: 1960/12/12   Medicare Important Message Given:  Yes     [name] 09/17/2021, 3:22 PM

## 2021-09-17 NOTE — Progress Notes (Signed)
PT Cancellation Note  Patient Details Name: Daniel [name] MRN: [id number] DOB: 06-13-1960   Cancelled Treatment:    Reason Eval/Treat Not Completed: Pain limiting ability to participate. Patient requesting pain medicine prior to mobility, RN notified. Will check back later.    [name] 09/17/2021, 10:03 AM

## 2021-09-18 DIAGNOSIS — S46019A Strain of muscle(s) and tendon(s) of the rotator cuff of unspecified shoulder, initial encounter: Secondary | ICD-10-CM

## 2021-09-18 DIAGNOSIS — D62 Acute posthemorrhagic anemia: Secondary | ICD-10-CM | POA: Diagnosis not present

## 2021-09-18 DIAGNOSIS — R918 Other nonspecific abnormal finding of lung field: Secondary | ICD-10-CM | POA: Diagnosis not present

## 2021-09-18 DIAGNOSIS — K922 Gastrointestinal hemorrhage, unspecified: Secondary | ICD-10-CM

## 2021-09-18 DIAGNOSIS — C220 Liver cell carcinoma: Secondary | ICD-10-CM | POA: Diagnosis not present

## 2021-09-18 DIAGNOSIS — K921 Melena: Secondary | ICD-10-CM

## 2021-09-18 DIAGNOSIS — N186 End stage renal disease: Secondary | ICD-10-CM | POA: Diagnosis not present

## 2021-09-18 LAB — CBC
HCT: 24.8 % — ABNORMAL LOW (ref 39.0–52.0)
Hemoglobin: 8.3 g/dL — ABNORMAL LOW (ref 13.0–17.0)
MCH: 33.5 pg (ref 26.0–34.0)
MCHC: 33.5 g/dL (ref 30.0–36.0)
MCV: 100 fL (ref 80.0–100.0)
Platelets: 124 10*3/uL — ABNORMAL LOW (ref 150–400)
RBC: 2.48 MIL/uL — ABNORMAL LOW (ref 4.22–5.81)
RDW: 21.2 % — ABNORMAL HIGH (ref 11.5–15.5)
WBC: 5.1 10*3/uL (ref 4.0–10.5)
nRBC: 0 % (ref 0.0–0.2)

## 2021-09-18 LAB — RENAL FUNCTION PANEL
Albumin: 2.3 g/dL — ABNORMAL LOW (ref 3.5–5.0)
Anion gap: 9 (ref 5–15)
BUN: 24 mg/dL — ABNORMAL HIGH (ref 8–23)
CO2: 25 mmol/L (ref 22–32)
Calcium: 8.4 mg/dL — ABNORMAL LOW (ref 8.9–10.3)
Chloride: 95 mmol/L — ABNORMAL LOW (ref 98–111)
Creatinine, Ser: 5.77 mg/dL — ABNORMAL HIGH (ref 0.61–1.24)
GFR, Estimated: 10 mL/min — ABNORMAL LOW (ref >60)
Glucose, Bld: 82 mg/dL (ref 70–99)
Phosphorus: 4.5 mg/dL (ref 2.5–4.6)
Potassium: 3.8 mmol/L (ref 3.5–5.1)
Sodium: 129 mmol/L — ABNORMAL LOW (ref 135–145)

## 2021-09-18 MED ORDER — HYDROCODONE-ACETAMINOPHEN 5-325 MG PO TABS: 1.0000 | ORAL_TABLET | ORAL | 0 refills | Status: AC | PRN

## 2021-09-18 MED ORDER — LIDOCAINE HCL (PF) 1 % IJ SOLN: 5.0000 mL | INTRAMUSCULAR | Status: DC | PRN

## 2021-09-18 MED ORDER — SODIUM CHLORIDE 0.9 % IV SOLN: 100.0000 mL | INTRAVENOUS | Status: DC | PRN

## 2021-09-18 MED ORDER — SODIUM THIOSULFATE 250 MG/ML IV SOLN
25.0000 g | INTRAVENOUS | Status: DC
  Filled 2021-09-18: qty 100

## 2021-09-18 MED ORDER — HEPARIN SODIUM (PORCINE) 1000 UNIT/ML DIALYSIS: 1000.0000 [IU] | INTRAMUSCULAR | Status: DC | PRN

## 2021-09-18 MED ORDER — LIDOCAINE-PRILOCAINE 2.5-2.5 % EX CREA: 1.0000 "application " | TOPICAL_CREAM | CUTANEOUS | Status: DC | PRN

## 2021-09-18 MED ORDER — CALCIUM ACETATE (PHOS BINDER) 667 MG/5ML PO SOLN: 667.0000 mg | Freq: Three times a day (TID) | ORAL | 2 refills | Status: DC

## 2021-09-18 MED ORDER — ALTEPLASE 2 MG IJ SOLR: 2.0000 mg | Freq: Once | INTRAMUSCULAR | Status: DC | PRN

## 2021-09-18 MED ORDER — PANTOPRAZOLE SODIUM 40 MG PO TBEC: 40.0000 mg | DELAYED_RELEASE_TABLET | Freq: Two times a day (BID) | ORAL | 2 refills | Status: DC

## 2021-09-18 MED ORDER — GABAPENTIN 100 MG PO CAPS: 100.0000 mg | ORAL_CAPSULE | Freq: Three times a day (TID) | ORAL | 2 refills | Status: DC

## 2021-09-18 MED ORDER — ASPIRIN 81 MG PO TBEC: 81.0000 mg | DELAYED_RELEASE_TABLET | Freq: Every day | ORAL | 2 refills | Status: DC

## 2021-09-18 MED ORDER — PENTAFLUOROPROP-TETRAFLUOROETH EX AERO: 1.0000 "application " | INHALATION_SPRAY | CUTANEOUS | Status: DC | PRN

## 2021-09-18 MED ORDER — AMLODIPINE BESYLATE 5 MG PO TABS: 5.0000 mg | ORAL_TABLET | Freq: Every day | ORAL | 2 refills | Status: DC

## 2021-09-18 MED ORDER — ATORVASTATIN CALCIUM 40 MG PO TABS
40.0000 mg | ORAL_TABLET | Freq: Every day | ORAL | 2 refills | Status: DC
Start: 2021-09-18 — End: 2022-04-01

## 2021-09-18 MED ORDER — TRAZODONE HCL 50 MG PO TABS: 50.0000 mg | ORAL_TABLET | Freq: Every evening | ORAL | 0 refills | Status: DC | PRN

## 2021-09-18 NOTE — Progress Notes (Signed)
Discharge instructions given to pt. Pt verbalized understanding of all teaching. IV removed. Pt currently getting dressed and then will be wheeled down by staff.

## 2021-09-18 NOTE — Discharge Summary (Signed)
Physician Discharge Summary  [place] YQM:[id number] DOB: [birth year] DOA: 09/14/2021  PCP: [name], NP  Admit date: 09/14/2021 Discharge date: 09/18/2021  Admitted From: Home Disposition: Home  Recommendations for Outpatient Follow-up:  Follow up with PCP in 1-2 weeks Follow-up with gastroenterology, Dr. Fuller Plan as needed Follow-up with medical oncology, 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  In the ED his hemoglobin is noted to be 5.9, creatinine 10.2, BUN 62, potassium 4.3 with bicarb 23.  GI was consulted.  Hospital service consulted for further evaluation management.  Hospital course:  Acute on chronic blood loss anemia GI bleed 2/2 AVM and esophageal varices. Hx duodenal ulcer Patient presenting to ED with weakness, dark tarry stools.  Gastroenterology was consulted and patient underwent small bowel endoscopy on 09/15/2021 with findings of small esophageal varices, portal hypertensive gastropathy, single gastric polyp and 3 bleeding angiectasia's in the duodenum treated with APC.  Biopsy results with reactive gastropathy with foveolar hyperplasia consistent with a hyper plastic polyp negative for H. pylori, no intestinal metaplasia/dysplasia or carcinoma.  Patient was transfused total 3 unit PRBCs during hospitalizations and hemoglobin  remained stable, 8.4 at time of discharge.  Recommend outpatient follow-up with GI/oncology/nephrology for likely need of intermittent blood and iron transfusions outpatient.  Continue Protonix 40 mg p.o. twice daily.  Acute on chronic diastolic congestive heart failure Etiology likely secondary to missed HD sessions.  Continue fluid/volume management per HD, nephrology.  Hepatocellular carcinoma Continue outpatient follow-up with medical oncology, [name]  Cirrhosis secondary to hepatitis C with history of EtOH abuse Counseled on need for complete alcohol cessation.  Right upper lobe lung mass Continue outpatient follow-up with medical oncology  Essential hypertension Amlodipine increased to 5 mg p.o. daily.  ESRD on HD Continue outpatient HD per nephrology  Chronic back pain Continue Vicodin as needed  Supraspinatus tendon rupture MR right/left shoulders with bilateral supraspinatus tendon rupture/tear.  Discussed with orthopedics, Hilbert Odor, PA on 09/18/2021.  Recommended physical therapy and outpatient follow-up in clinic.  No surgical intervention at this time.   Discharge Diagnoses:  Active Problems:   Acute blood loss anemia   ESRD (end stage renal disease) on dialysis ([place])   Hepatocellular carcinoma (HCC)   Lung mass   Acute lower GI bleeding   GI bleed    Discharge Instructions  Discharge Instructions     Call MD for:  difficulty breathing, headache or visual disturbances   Complete by: As directed    Call MD for:  extreme fatigue   Complete by: As directed    Call MD for:  persistant dizziness or light-headedness   Complete by: As directed    Call MD for:  persistant nausea and vomiting   Complete by: As directed    Call MD for:  severe uncontrolled pain   Complete by: As directed    Call MD for:  temperature >100.4   Complete by: As directed  Diet - low sodium heart healthy   Complete by: As directed    Increase activity slowly   Complete  by: As directed    No wound care   Complete by: As directed       Allergies as of 09/18/2021       Reactions   Seroquel [quetiapine] Other (See Comments)   Tardive kinesia/dystonia   Dilaudid [hydromorphone Hcl] Itching, Other (See Comments)   Pt reports itchiness after IM injection         Medication List     TAKE these medications    amLODipine 5 MG tablet Commonly known as: NORVASC Take 1 tablet (5 mg total) by mouth daily. What changed:  medication strength how much to take when to take this   aspirin EC 81 MG tablet Take 1 tablet (81 mg total) by mouth daily.   atorvastatin 40 MG tablet Commonly known as: LIPITOR Take 1 tablet (40 mg total) by mouth daily.   calcium acetate (Phos Binder) 667 MG/5ML Soln Commonly known as: PHOSLYRA Take 5 mLs (667 mg total) by mouth 3 (three) times daily with meals.   Darbepoetin Alfa 100 MCG/0.5ML Sosy injection Commonly known as: ARANESP Inject 0.5 mLs (100 mcg total) into the vein every 7 (seven) days for 8 doses.   gabapentin 100 MG capsule Commonly known as: NEURONTIN Take 1 capsule (100 mg total) by mouth 3 (three) times daily.   HYDROcodone-acetaminophen 5-325 MG tablet Commonly known as: NORCO/VICODIN Take 1 tablet by mouth every 4 (four) hours as needed for up to 7 days for severe pain.   methocarbamol 500 MG tablet Commonly known as: ROBAXIN Take 1 tablet (500 mg total) by mouth every 6 (six) hours as needed for muscle spasms.   pantoprazole 40 MG tablet Commonly known as: PROTONIX Take 1 tablet (40 mg total) by mouth 2 (two) times daily before a meal.   traZODone 50 MG tablet Commonly known as: DESYREL Take 1 tablet (50 mg total) by mouth at bedtime as needed for sleep.        Follow-up Information     [name], NP. Schedule an appointment as soon as possible for a visit in 1 week(s).   Specialty: Internal Medicine Contact information: [place]  [address] [phone]         Ladene Artist, MD. Schedule an appointment as soon as possible for a visit.   Specialty: Gastroenterology Contact information: [address] [place] [address] [phone]         Truitt Merle, MD. Schedule an appointment as soon as possible for a visit.   Specialties: Hematology, Oncology Contact information: [address] [place] [address] [phone]                Allergies  Allergen Reactions   Seroquel [Quetiapine] Other (See Comments)    Tardive kinesia/dystonia   Dilaudid [Hydromorphone Hcl] Itching and Other (See Comments)    Pt reports itchiness after IM injection     Consultations: Nephrology Gastroenterology Case discussed with orthopedics, Hilbert Odor, PA on 5/24 regarding supraspinatus tendon tear; recommends PT and outpatient follow-up   Procedures/Studies: DG Thoracic Spine 2 View  Result Date: 08/24/2021 CLINICAL DATA:  Pain in the thoracic spine without reported history of trauma. EXAM: THORACIC SPINE 2 VIEWS COMPARISON:  Prior PET exam from July 23, 2021. FINDINGS: Spinal alignment is maintained. Signs of degenerative changes throughout the thoracic spine. No visible fracture signs of static malalignment.  Upper thoracic spine with limited assessment due to overlap on lateral projection. Mid and lower thoracic spine are well evaluated. IMPRESSION: 1. Signs of degenerative changes throughout the thoracic spine. 2. No visible fracture or static malalignment, mildly limited assessment as below. 3. Limited evaluation of the upper thoracic spine due to a bony structures and presence of vascular congestion and pulmonary edema seen on previous imaging. Electronically Signed   By: Zetta Bills M.D.   On: 08/24/2021 16:49   MR THORACIC SPINE WO CONTRAST  Result Date: 08/27/2021 CLINICAL DATA:  Mid-back pain EXAM: MRI THORACIC SPINE WITHOUT CONTRAST TECHNIQUE: Multiplanar, multisequence MR imaging of  the thoracic spine was performed. No intravenous contrast was administered. COMPARISON:  Radiograph 08/24/2021 FINDINGS: Poor image quality due to motion artifact. Alignment:  Physiologic. Vertebrae: There is no evidence of acute fracture, discitis, or aggressive osseous lesion. There is heterogeneous marrow signal, mildly low diffusely on T1. Cord: Poorly evaluated due to image quality. There is possible focal abnormal cord signal at the level of T6-T7. Paraspinal and other soft tissues: Small bilateral pleural effusions. Mild paraspinal soft tissue edema in the lower cervical spine, partially visualized. Disc levels: Pertinent levels below: No spinal canal stenosis or neural foraminal narrowing from T1 through T5. T5-T6: There is right-sided bony spurring and ligamentum flavum thickening resulting in mild right lateral recess narrowing. On the left, there is a focal left paracentral/foraminal disc protrusion resulting in mild to moderate left-sided neural foraminal stenosis. T6-T7: Right paracentral disc osteophyte complex results in indentation of the anterior cord with questionable associated abnormal cord signal. There is no significant neural foraminal stenosis. T7-T8: Small right paracentral disc osteophyte complex abuts the ventral cord without deformation. T8-T9: Left paracentral disc protrusion results in mild left lateral recess narrowing. No significant neural foraminal stenosis. T9-T10: Asymmetric left disc bulging and ligamentum flavum hypertrophy results in moderate spinal canal stenosis, mild to moderate left neural foraminal stenosis. T10-T11: Asymmetric right disc bulging, ligament flavum hypertrophy results in mild spinal canal narrowing. There is moderate bilateral neural foraminal stenosis. T11-T12: Broad-based disc bulging and bilateral facet arthropathy. Mild spinal canal narrowing. Moderate bilateral neural foraminal stenosis. T12-L1: Broad-based disc bulging results in minimal spinal canal  narrowing and moderate left and mild right foraminal narrowing. 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 Of note, there is a right paracentral disc osteophyte complex at T6-T7 resulting in indentation of the ventral cord with questionable associated abnormal cord signal. Heterogeneous marrow signal with generalized low T1 signal, nonspecific but can be seen in hematologic derangement such as anemia. Mild posterior paraspinal soft tissue edema in the lower cervical spine, partially visualized. Small bilateral pleural effusions. Electronically Signed   By: Maurine Simmering M.D.   On: 08/27/2021 10:56   DG Pelvis Portable  Result Date: 09/15/2021 CLINICAL DATA:  Fall with hip pain. EXAM: PORTABLE PELVIS 1-2 VIEWS COMPARISON:  Pelvis radiograph 08/11/2021 FINDINGS: No evidence of acute fracture of the pelvis or hips. Femoral heads remain seated. The bones are subjectively under mineralized. Grossly intact pubic rami. Pubic symphysis and sacroiliac joints are congruent. Advanced vascular calcifications. IMPRESSION: No acute fracture of the pelvis or hips. Electronically Signed   By: Keith Rake M.D.   On: 09/15/2021 00:16   MR SHOULDER RIGHT WO CONTRAST  Result Date: 09/17/2021 CLINICAL DATA:  Chronic shoulder pain. Osteoarthritis suspected. Rotator cuff disorder suspected. Evaluate for metastases. EXAM:  MRI OF THE RIGHT SHOULDER WITHOUT CONTRAST TECHNIQUE: Multiplanar, multisequence MR imaging of the shoulder was performed. No intravenous contrast was administered. COMPARISON:  Right shoulder radiographs 10/13/2018 FINDINGS: Despite efforts by the technologist and patient, motion artifact is present on today's exam and could not be eliminated. This reduces exam sensitivity and specificity. Rotator cuff: There is a massive full-thickness tear of the entire  AP dimension of the supraspinatus tendon footprint with at least 3.1 cm tendon retraction to the humeral head apex. Within the limitations of patient motion artifact, there is likely partial-thickness anterior infraspinatus tendon tearing. Punctate fluid bright signal at the posterior infraspinatus musculotendinous junction and proximal tendon footprint, a small partial-thickness tear (sagittal series 7 images 15 through 19 and coronal series 5 image 7). Diffuse intermediate T2 signal suggesting moderate subscapularis tendinosis. The teres minor appears intact. Muscles: Within the limitations of motion artifact there appears to be moderate supraspinatus and anterior infraspinatus muscle atrophy and fatty infiltration. Biceps long head: The intra-articular long head of the biceps tendon is not well visualized. The tendon is seen within the distal aspect of the bicipital groove (axial images 21-23) but not the proximal aspect of the bicipital groove. Findings are suspicious for proximal tendon rupture with distal tendon retraction. Acromioclavicular Joint: There are mild-to-moderate degenerative changes of the acromioclavicular joint including joint space narrowing, clavicular head subchondral marrow edema, and peripheral osteophytosis. Type II acromion. Glenohumeral Joint: Moderate glenoid and humeral head cartilage thinning. Moderate anterior inferior greater than posteroinferior glenoid subchondral marrow edema and cystic change. Likely mild chronic superomedial humeral head cortical flattening/remodeling degenerative change. Labrum: Motion artifact limits evaluation. Likely attenuation and degenerative fraying of the superior glenoid labrum. Bones: Mild marrow edema within the greater tuberosity deep to the supraspinatus tendon insertion. There is tubular and serpiginous decreased T1 and heterogeneous T2 signal seen within the proximal diaphysis of the humerus. This corresponds to serpiginous sclerotic and lucent  signal on prior 10/13/2018 radiographs. This appears to represent a chronic bone infarct. No cortical destruction or extraosseous soft tissue mass. Other: None. IMPRESSION: 1. Unfortunately, moderate to high-grade patient motion artifact technically limits this study. 2. Massive full-thickness tear of the entire AP dimension of the supraspinatus tendon footprint. Moderate supraspinatus muscle atrophy. 3. Likely anterior infraspinatus partial-thickness tendon tearing. Mild partial-thickness tear within the posterior infraspinatus musculotendinous junction and proximal tendon. 4. Moderate subscapularis tendinosis. 5. Mild-to-moderate degenerative changes of the acromioclavicular and glenohumeral joints. 6. 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 Electronically Signed   By: Yvonne Kendall M.D.   On: 09/17/2021 12:36   MR SHOULDER LEFT WO CONTRAST  Result Date: 09/17/2021 CLINICAL DATA:  Shoulder pain. Rotator cuff disorder suspected. Evaluate for metastases. EXAM: MRI OF THE LEFT SHOULDER WITHOUT CONTRAST TECHNIQUE: Multiplanar, multisequence MR imaging of the shoulder was performed. No intravenous contrast was administered. COMPARISON:  Left shoulder radiographs 09/16/2021 FINDINGS: Despite efforts by the technologist and patient, motion artifact is present on today's exam and could not be eliminated. This reduces exam sensitivity and specificity. Rotator cuff: There is horizontal linear fluid bright signal indicating a high-grade partial-thickness bursal sided tear of the supraspinatus tendon footprint measuring up to 11 mm in transverse dimension (coronal series 4 images thirteen through 15) and 16 mm in AP dimension (sagittal series 6, images 18 and 19). Only minimal approximate 5 mm tendon retraction of the bursal sided fibers. The infraspinatus is intact. Minimal superior subscapularis intermediate T2 signal tendinosis. The teres minor is intact. Muscles: No rotator cuff muscle atrophy,  fatty infiltration, or edema. Biceps long head: The intra-articular long head of the biceps tendon is intact. Acromioclavicular Joint: There are mild-to-moderate degenerative changes of the acromioclavicular joint including joint space narrowing, clavicular head subchondral marrow edema, and peripheral osteophytosis. Type II acromion. Mild subacromial/subdeltoid bursitis. Glenohumeral Joint: Moderate thinning of the glenoid and humeral head cartilage. Labrum: Grossly intact, but evaluation is limited by lack of intraarticular fluid. Bones:  No acute fracture. Other: Prominent anterior shoulder blood vessels. Note is made of a stent overlying the left axilla on prior radiographs. IMPRESSION: 1. Horizontal linear high-grade partial-thickness bursal sided tear of the anterior supraspinatus tendon footprint measuring up to 16 mm in AP dimension. 2. Mild-to-moderate degenerative changes of the acromioclavicular joint. 3. Moderate thinning of the glenoid and humeral head cartilage. Electronically Signed   By: Yvonne Kendall M.D.   On: 09/17/2021 12:23   DG CHEST PORT 1 VIEW  Result Date: 09/15/2021 CLINICAL DATA:  Shortness of breath EXAM: PORTABLE CHEST 1 VIEW COMPARISON:  09/14/2021 FINDINGS: Stable cardiomegaly. Aortic atherosclerosis. Mild pulmonary vascular congestion. Mildly increased perihilar and bibasilar interstitial markings bilaterally. No large pleural fluid collection. No pneumothorax. IMPRESSION: Findings suggestive of CHF with mild interstitial edema. Electronically Signed   By: Davina Poke D.O.   On: 09/15/2021 18:46   DG Chest Portable 1 View  Result Date: 09/14/2021 CLINICAL DATA:  Shortness of breath EXAM: PORTABLE CHEST 1 VIEW COMPARISON:  08/16/2021 FINDINGS: Transverse diameter of heart is increased. Central pulmonary vessels are prominent. Increased interstitial markings are seen in the parahilar regions and lower lung fields. There is hazy homogeneous opacity in the right parahilar  region. Lateral CP angles are indistinct. There is no pneumothorax. There is vascular stent in the left axilla. IMPRESSION: Cardiomegaly. Central pulmonary vessels are prominent suggesting possible mild CHF. Increased interstitial markings are seen in the parahilar regions and lower lung fields suggesting mild interstitial edema or interstitial pneumonia. There is hazy homogeneous opacity in the right parahilar region which may suggest loculated effusion in the interlobar fissure or subsegmental atelectasis. Electronically Signed   By: Elmer Picker M.D.   On: 09/14/2021 12:52   DG Shoulder Left  Result Date: 09/16/2021 CLINICAL DATA:  Left shoulder pain no known trauma. EXAM: LEFT SHOULDER - 2+ VIEW COMPARISON:  None Available. FINDINGS: There is no evidence of fracture or dislocation. Mild degenerative change of the glenohumeral and acromioclavicular joints. Mineralization of the supraspinatus tendon. Vascular stent projects over the left axilla. Vascular calcifications. IMPRESSION: 1. No acute fracture or dislocation. 2. Mild degenerative changes of the glenohumeral and acromioclavicular joints. 3. Calcific tendinosis of the supraspinatus tendon. Electronically Signed   By: Dahlia Bailiff M.D.   On: 09/16/2021 16:21   VAS [place] ABI WITH/WO TBI  Result Date: 08/20/2021  LOWER EXTREMITY DOPPLER STUDY Patient Name:  DENNARD VEZINA  Date of Exam:   08/20/2021 Medical Rec #: [id number]           Accession #:    [id number] Date of Birth: 1960/12/30           Patient Gender: M Patient Age:   33 years Exam Location:  [facility] Procedure:      VAS [place] ABI WITH/WO TBI Referring Phys: --------------------------------------------------------------------------------  Indications: Stent evaluation.  Vascular Interventions: PRE-OPERATIVE DIAGNOSIS: 07/08/2021: Atherosclerosis of                         native arteries of right lower extremity causing  ischemic rest pain                           POST-OPERATIVE DIAGNOSIS: Same                          PROCEDURE:                         1) US guided left common femoral access                         2) Aortogram                         3) Right lower extremity angiogram with third order                         cannulation (133m total contrast)                         4) Right superficial femoral artery angioplasty and                         stenting ([id number])                         5) Conscious sedation (42 minutes). Performing Technologist: ErRonal FearVS, RCS  Examination Guidelines: A complete evaluation includes at minimum, Doppler waveform signals and systolic blood pressure reading at the level of bilateral brachial, anterior tibial, and posterior tibial arteries, when vessel segments are accessible. Bilateral testing is considered an integral part of a complete examination. Photoelectric Plethysmograph (PPG) waveforms and toe systolic pressure readings are included as required and additional duplex testing as needed. Limited examinations for reoccurring indications may be performed as noted.  ABI Findings: +---------+------------------+-----+----------+----------------+ Right    Rt Pressure (mmHg)IndexWaveform  Comment          +---------+------------------+-----+----------+----------------+ Brachial                        triphasic Non-compressible +---------+------------------+-----+----------+----------------+ PTA                             triphasic Non-compressible +---------+------------------+-----+----------+----------------+ DP                              monophasicNon-compressible +---------+------------------+-----+----------+----------------+ Great Toe100                                               +---------+------------------+-----+----------+----------------+ +--------+------------------+-----+--------+-------+ Left    Lt Pressure (mmHg)IndexWaveformComment  +--------+------------------+-----+--------+-------+ Brachial                               AVF     +--------+------------------+-----+--------+-------+ PTA                                    BKA     +--------+------------------+-----+--------+-------+ DP  BKA     +--------+------------------+-----+--------+-------+ +-------+-----------+-----------+------------+------------+ ABI/TBIToday's ABIToday's TBIPrevious ABIPrevious TBI +-------+-----------+-----------+------------+------------+ Right  [place]         [place]         [place]          0.48         +-------+-----------+-----------+------------+------------+   Summary: Right: Resting right ankle-brachial index indicates noncompressible right lower extremity arteries. The great toe pressure is 100 mmHg. Pre-intervention was 62 mmHg. *See table(s) above for measurements and observations.  Electronically signed by Monica Martinez MD on 08/20/2021 at 3:38:38 PM.    Final    VAS [place] LOWER EXTREMITY ARTERIAL DUPLEX  Result Date: 08/20/2021 LOWER EXTREMITY ARTERIAL DUPLEX STUDY Patient Name:  DARIK MASSING  Date of Exam:   08/20/2021 Medical Rec #: [id number]           Accession #:    [id number] Date of Birth: [birth year]           Patient Gender: M Patient Age:   45 years Exam Location:  [facility] Procedure:      VAS [place] LOWER EXTREMITY ARTERIAL DUPLEX Referring Phys: Jamelle Haring --------------------------------------------------------------------------------  Indications: Stent evaluation. High Risk Factors: Diabetes.  Vascular Interventions: PRE-OPERATIVE DIAGNOSIS: 07/08/2021: Atherosclerosis of                         native arteries of right lower extremity causing                         ischemic rest pain                          POST-OPERATIVE DIAGNOSIS: Same                          PROCEDURE:                         1) US guided left common femoral access                         2)  Aortogram                         3) Right lower extremity angiogram with third order                         cannulation (156m total contrast)                         4) Right superficial femoral artery angioplasty and                         stenting ([id number])                         5) Conscious sedation (42 minutes). Current ABI:            R=[place], L=BKA Performing Technologist: ErRonal FearVS, RCS  Examination Guidelines: A complete evaluation includes B-mode imaging, spectral Doppler, color Doppler, and power Doppler as needed of all accessible portions of each vessel. Bilateral testing is considered an integral part of a complete examination. Limited examinations for reoccurring indications may be performed as noted.  Right Stent(s): +---------------+--------+--------+--------+--------+ mid SFA        PSV cm/sStenosisWaveformComments +---------------+--------+--------+--------+--------+ Prox to Stent  93              biphasic         +---------------+--------+--------+--------+--------+ Proximal Stent 121             biphasic         +---------------+--------+--------+--------+--------+ Mid Stent      162             biphasic         +---------------+--------+--------+--------+--------+ Distal Stent   122             biphasic         +---------------+--------+--------+--------+--------+ Distal to Stent133             biphasic         +---------------+--------+--------+--------+--------+    Summary: Right: Widely patent superficial femoral artery stent without hemodynamically significant stenosis.  See table(s) above for measurements and observations. Electronically signed by Monica Martinez MD on 08/20/2021 at 4:26:44 PM.    Final    IR Radiologist Eval & Mgmt  Result Date: 09/12/2021 Please refer to notes tab for details about interventional procedure. (Op Note)    Subjective:   Discharge Exam: Vitals:   09/18/21 1000 09/18/21 1030  BP: (!) 151/79  (!) 146/83  Pulse: 83 86  Resp: 11 15  Temp:    SpO2:     Vitals:   09/18/21 0900 09/18/21 0930 09/18/21 1000 09/18/21 1030  BP: 126/85 (!) 155/92 (!) 151/79 (!) 146/83  Pulse: 82  83 86  Resp: '16 13 11 15  '$ Temp:      TempSrc:      SpO2:      Weight:      Height:        Physical Exam: GEN: NAD, alert and oriented x 3, chronically ill in appearance HEENT: NCAT, PERRL, EOMI, sclera clear, MMM PULM: CTAB w/o wheezes/crackles, normal respiratory effort, on room air CV: RRR w/o M/G/R GI: abd soft, NTND, NABS, no R/G/M MSK: Decreased muscle strength with bilateral shoulder abduction NEURO: CN II-XII intact, no focal deficits, sensation to light touch intact PSYCH: normal mood/affect Integumentary: dry/intact, no rashes or wounds    The results of significant diagnostics from this hospitalization (including imaging, microbiology, ancillary and laboratory) are listed below for reference.     Microbiology: No results found for this or any previous visit (from the past 240 hour(s)).   Labs: BNP (last 3 results) Recent Labs    01/05/21 0505 01/06/21 1804 06/14/21 0356  BNP 87.9 193.9* 811.9*   Basic Metabolic Panel: Recent Labs  Lab 09/15/21 0010 09/15/21 1928 09/16/21 0921 09/17/21 0314 09/18/21 0918  NA 136 131* 131* 127* 129*  K 3.4* 3.9 3.1* 3.7 3.8  CL 97* 95* 95* 92* 95*  CO2 '29 25 27 27 25  '$ GLUCOSE 83 92 93 91 82  BUN 22 29* 19 14 24*  CREATININE 5.05* 6.19* 4.08* 4.06* 5.77*  CALCIUM 8.3* 8.5* 8.2* 8.4* 8.4*  PHOS  --  5.0* 2.9  --  4.5   Liver Function Tests: Recent Labs  Lab 09/14/21 1049 09/15/21 0010 09/15/21 1928 09/16/21 0921 09/18/21 0918  AST 33 30  --   --   --   ALT 14 12  --   --   --   ALKPHOS 87 80  --   --   --   BILITOT 1.2 1.0  --   --   --  PROT 7.4 7.1  --   --   --   ALBUMIN 2.5* 2.3* 2.6* 2.5* 2.3*   No results for input(s): LIPASE, AMYLASE in the last 168 hours. No results for input(s): AMMONIA in the last 168  hours. CBC: Recent Labs  Lab 09/15/21 0010 09/15/21 1814 09/15/21 2142 09/16/21 0820 09/16/21 1750 09/17/21 0314 09/18/21 0918  WBC 5.1  --  6.0 5.6  --  5.4 5.1  NEUTROABS  --   --   --   --   --  3.6  --   HGB 6.2*   < > 7.5* 7.3* 8.9* 8.4* 8.3*  HCT 19.0*   < > 22.6* 22.6* 27.1* 25.7* 24.8*  MCV 102.7*  --  103.7* 103.2*  --  98.8 100.0  PLT 123*  --  134* 129*  --  129* 124*   < > = values in this interval not displayed.   Cardiac Enzymes: No results for input(s): CKTOTAL, CKMB, CKMBINDEX, TROPONINI in the last 168 hours. BNP: Invalid input(s): POCBNP CBG: Recent Labs  Lab 09/15/21 1342  GLUCAP 79   D-Dimer No results for input(s): DDIMER in the last 72 hours. Hgb A1c No results for input(s): HGBA1C in the last 72 hours. Lipid Profile No results for input(s): CHOL, HDL, LDLCALC, TRIG, CHOLHDL, LDLDIRECT in the last 72 hours. Thyroid function studies No results for input(s): TSH, T4TOTAL, T3FREE, THYROIDAB in the last 72 hours.  Invalid input(s): FREET3 Anemia work up No results for input(s): VITAMINB12, FOLATE, FERRITIN, TIBC, IRON, RETICCTPCT in the last 72 hours. Urinalysis    Component Value Date/Time   COLORURINE YELLOW 07/31/2019 2128   APPEARANCEUR CLEAR 07/31/2019 2128   LABSPEC 1.009 07/31/2019 2128   PHURINE 5.0 07/31/2019 2128   GLUCOSEU NEGATIVE 07/31/2019 2128   HGBUR SMALL (A) 07/31/2019 2128   BILIRUBINUR NEGATIVE 07/31/2019 2128   KETONESUR NEGATIVE 07/31/2019 2128   PROTEINUR 100 (A) 09/03/2021 1150   UROBILINOGEN 0.2 12/31/2009 0746   NITRITE NEGATIVE 07/31/2019 2128   LEUKOCYTESUR NEGATIVE 07/31/2019 2128   Sepsis Labs Invalid input(s): PROCALCITONIN,  WBC,  LACTICIDVEN Microbiology No results found for this or any previous visit (from the past 240 hour(s)).   Time coordinating discharge: Over 30 minutes  SIGNED:   Donnamarie Poag [place] DO  [facility] 09/18/2021, 11:03 AM

## 2021-09-18 NOTE — Progress Notes (Signed)
Patient transfer to Dialysis.

## 2021-09-18 NOTE — TOC Progression Note (Signed)
Transition of Care [facility]) - Progression Note    Patient Details  Name: Daniel [name] MRN: [id number] Date of Birth: 03-10-1961  Transition of [facility]) CM/SW Contact  Jacalyn Lefevre Edson [place], RN Phone Number: 09/18/2021, 12:35 PM  Clinical Narrative:     PT recommendations no PT follow up. Was asked to arrange HHPT.   Discussed with patient. He has to move at the end of the month and does not have an address starting September 26, 2021 . He has received resources. TOC Team unable to arrange HHPT do to unknown address   Expected Discharge Plan: Home/Self Care Barriers to Discharge: Continued Medical Work up  Expected Discharge Plan and Services Expected Discharge Plan: Home/Self Care     Post Acute Care Choice: NA Living arrangements for the past 2 months: Single Family Home Expected Discharge Date: 09/18/21               DME Arranged: N/A DME Agency: NA       HH Arranged: NA           Social Determinants of Health (SDOH) Interventions    Readmission Risk Interventions    02/27/2021   12:51 PM 07/07/2019    4:03 PM  Readmission Risk Prevention Plan  Transportation Screening Complete Complete  PCP or Specialist Appt within 3-5 Days  Complete  HRI or [place]  Complete  Social Work Consult for [place] Planning/Counseling  Complete  Palliative Care Screening  Not Applicable  Medication Review Press photographer) Complete Complete  PCP or Specialist appointment within 3-5 days of discharge Complete   HRI or [place] Complete   SW Recovery Care/Counseling Consult Complete   [place] Not Applicable

## 2021-09-18 NOTE — Progress Notes (Signed)
Patient arrived back to [place] room 20 from hemodialysis alert and oriented x4. Bed in lowest position call light in reach. Will continue to monitor pt.

## 2021-09-18 NOTE — Progress Notes (Signed)
D/C order noted. Contacted [facility] to advise clinic of pt's d/c today and that pt should resume on Friday.   Melven Sartorius Renal Navigator [phone]

## 2021-09-18 NOTE — Progress Notes (Signed)
[facility] Progress Note   Subjective:   Patient seen and examined in dialysis.  Tolerating well so far. Admits to orthopnea.  Denies CP, SOB, abdominal pain and n/v/d.    Objective Vitals:   09/17/21 1504 09/17/21 2106 09/18/21 0524 09/18/21 0750  BP: (!) 182/101 (!) 162/93 (!) 136/41 (!) 161/90  Pulse: 90 91 84 79  Resp: '17 18 19 14  '$ Temp: 98.1 F (36.7 C) (!) 97.5 F (36.4 C) 97.9 F (36.6 C)   TempSrc: Oral Oral Oral   SpO2: 95% 98% 99% 98%  Weight:      Height:       Physical Exam General:chronically ill appearing male in NAD Heart:RRR, no mrg Lungs:mostly CTAB anteriorly, nml WOB on RA Abdomen:soft, NTND Extremities:trace LE edema, L AKA Dialysis Access: LU AVF +b/t   Filed Weights   09/14/21 2046 09/16/21 0800 09/16/21 1220  Weight: 116 kg 112.9 kg 109.4 kg   No intake or output data in the 24 hours ending 09/18/21 0800  Additional Objective Labs: Basic Metabolic Panel: Recent Labs  Lab 09/15/21 1928 09/16/21 0921 09/17/21 0314  NA 131* 131* 127*  K 3.9 3.1* 3.7  CL 95* 95* 92*  CO2 '25 27 27  '$ GLUCOSE 92 93 91  BUN 29* 19 14  CREATININE 6.19* 4.08* 4.06*  CALCIUM 8.5* 8.2* 8.4*  PHOS 5.0* 2.9  --    Liver Function Tests: Recent Labs  Lab 09/14/21 1049 09/15/21 0010 09/15/21 1928 09/16/21 0921  AST 33 30  --   --   ALT 14 12  --   --   ALKPHOS 87 80  --   --   BILITOT 1.2 1.0  --   --   PROT 7.4 7.1  --   --   ALBUMIN 2.5* 2.3* 2.6* 2.5*    CBC: Recent Labs  Lab 09/14/21 2213 09/15/21 0010 09/15/21 1814 09/15/21 2142 09/16/21 0820 09/16/21 1750 09/17/21 0314  WBC 5.2 5.1  --  6.0 5.6  --  5.4  NEUTROABS  --   --   --   --   --   --  3.6  HGB 6.2* 6.2*   < > 7.5* 7.3* 8.9* 8.4*  HCT 18.5* 19.0*   < > 22.6* 22.6* 27.1* 25.7*  MCV 101.6* 102.7*  --  103.7* 103.2*  --  98.8  PLT 127* 123*  --  134* 129*  --  129*   < > = values in this interval not displayed.   CBG: Recent Labs  Lab 09/15/21 1342  GLUCAP 79     Studies/Results: MR SHOULDER RIGHT WO CONTRAST  Result Date: 09/17/2021 CLINICAL DATA:  Chronic shoulder pain. Osteoarthritis suspected. Rotator cuff disorder suspected. Evaluate for metastases. EXAM: MRI OF THE RIGHT SHOULDER WITHOUT CONTRAST TECHNIQUE: Multiplanar, multisequence MR imaging of the shoulder was performed. No intravenous contrast was administered. COMPARISON:  Right shoulder radiographs 10/13/2018 FINDINGS: Despite efforts by the technologist and patient, motion artifact is present on today's exam and could not be eliminated. This reduces exam sensitivity and specificity. Rotator cuff: There is a massive full-thickness tear of the entire AP dimension of the supraspinatus tendon footprint with at least 3.1 cm tendon retraction to the humeral head apex. Within the limitations of patient motion artifact, there is likely partial-thickness anterior infraspinatus tendon tearing. Punctate fluid bright signal at the posterior infraspinatus musculotendinous junction and proximal tendon footprint, a small partial-thickness tear (sagittal series 7 images 15 through 19 and coronal series 5 image  7). Diffuse intermediate T2 signal suggesting moderate subscapularis tendinosis. The teres minor appears intact. Muscles: Within the limitations of motion artifact there appears to be moderate supraspinatus and anterior infraspinatus muscle atrophy and fatty infiltration. Biceps long head: The intra-articular long head of the biceps tendon is not well visualized. The tendon is seen within the distal aspect of the bicipital groove (axial images 21-23) but not the proximal aspect of the bicipital groove. Findings are suspicious for proximal tendon rupture with distal tendon retraction. Acromioclavicular Joint: There are mild-to-moderate degenerative changes of the acromioclavicular joint including joint space narrowing, clavicular head subchondral marrow edema, and peripheral osteophytosis. Type II acromion.  Glenohumeral Joint: Moderate glenoid and humeral head cartilage thinning. Moderate anterior inferior greater than posteroinferior glenoid subchondral marrow edema and cystic change. Likely mild chronic superomedial humeral head cortical flattening/remodeling degenerative change. Labrum: Motion artifact limits evaluation. Likely attenuation and degenerative fraying of the superior glenoid labrum. Bones: Mild marrow edema within the greater tuberosity deep to the supraspinatus tendon insertion. There is tubular and serpiginous decreased T1 and heterogeneous T2 signal seen within the proximal diaphysis of the humerus. This corresponds to serpiginous sclerotic and lucent signal on prior 10/13/2018 radiographs. This appears to represent a chronic bone infarct. No cortical destruction or extraosseous soft tissue mass. Other: None. IMPRESSION: 1. Unfortunately, moderate to high-grade patient motion artifact technically limits this study. 2. Massive full-thickness tear of the entire AP dimension of the supraspinatus tendon footprint. Moderate supraspinatus muscle atrophy. 3. Likely anterior infraspinatus partial-thickness tendon tearing. Mild partial-thickness tear within the posterior infraspinatus musculotendinous junction and proximal tendon. 4. Moderate subscapularis tendinosis. 5. Mild-to-moderate degenerative changes of the acromioclavicular and glenohumeral joints. 6. 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 Electronically Signed   By: Yvonne Kendall M.D.   On: 09/17/2021 12:36   MR SHOULDER LEFT WO CONTRAST  Result Date: 09/17/2021 CLINICAL DATA:  Shoulder pain. Rotator cuff disorder suspected. Evaluate for metastases. EXAM: MRI OF THE LEFT SHOULDER WITHOUT CONTRAST TECHNIQUE: Multiplanar, multisequence MR imaging of the shoulder was performed. No intravenous contrast was administered. COMPARISON:  Left shoulder radiographs 09/16/2021 FINDINGS: Despite efforts by the technologist and patient,  motion artifact is present on today's exam and could not be eliminated. This reduces exam sensitivity and specificity. Rotator cuff: There is horizontal linear fluid bright signal indicating a high-grade partial-thickness bursal sided tear of the supraspinatus tendon footprint measuring up to 11 mm in transverse dimension (coronal series 4 images thirteen through 15) and 16 mm in AP dimension (sagittal series 6, images 18 and 19). Only minimal approximate 5 mm tendon retraction of the bursal sided fibers. The infraspinatus is intact. Minimal superior subscapularis intermediate T2 signal tendinosis. The teres minor is intact. Muscles: No rotator cuff muscle atrophy, fatty infiltration, or edema. Biceps long head: The intra-articular long head of the biceps tendon is intact. Acromioclavicular Joint: There are mild-to-moderate degenerative changes of the acromioclavicular joint including joint space narrowing, clavicular head subchondral marrow edema, and peripheral osteophytosis. Type II acromion. Mild subacromial/subdeltoid bursitis. Glenohumeral Joint: Moderate thinning of the glenoid and humeral head cartilage. Labrum: Grossly intact, but evaluation is limited by lack of intraarticular fluid. Bones:  No acute fracture. Other: Prominent anterior shoulder blood vessels. Note is made of a stent overlying the left axilla on prior radiographs. IMPRESSION: 1. Horizontal linear high-grade partial-thickness bursal sided tear of the anterior supraspinatus tendon footprint measuring up to 16 mm in AP dimension. 2. Mild-to-moderate degenerative changes of the acromioclavicular joint. 3. Moderate thinning of the  glenoid and humeral head cartilage. Electronically Signed   By: Yvonne Kendall M.D.   On: 09/17/2021 12:23   DG Shoulder Left  Result Date: 09/16/2021 CLINICAL DATA:  Left shoulder pain no known trauma. EXAM: LEFT SHOULDER - 2+ VIEW COMPARISON:  None Available. FINDINGS: There is no evidence of fracture or  dislocation. Mild degenerative change of the glenohumeral and acromioclavicular joints. Mineralization of the supraspinatus tendon. Vascular stent projects over the left axilla. Vascular calcifications. IMPRESSION: 1. No acute fracture or dislocation. 2. Mild degenerative changes of the glenohumeral and acromioclavicular joints. 3. Calcific tendinosis of the supraspinatus tendon. Electronically Signed   By: Dahlia Bailiff M.D.   On: 09/16/2021 16:21    Medications:   amLODipine  5 mg Oral Daily   aspirin EC  81 mg Oral Daily   atorvastatin  40 mg Oral Daily   calcium acetate (Phos Binder)  667 mg Oral TID WC   Chlorhexidine Gluconate Cloth  6 each Topical Q0600   Chlorhexidine Gluconate Cloth  6 each Topical Q0600   gabapentin  100 mg Oral TID   pantoprazole (PROTONIX) IV  40 mg Intravenous Q12H    Dialysis Orders: SW MWF  4h 450/500  EDW 112kg  2/2 bath  LUA AVF   Hep none Na thiosulfate - 25g IV qHD No ESA     Assessment/Plan: Recurrent GI bleed/acute on chronic anemia - sp recent EGD that showed portal hypertensive gastropathy in the entire examined stomach and grade I varices in the distal esophagus. 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 +Bx for H. Pylori testing. Plan for PPI & await Bx results. Last Hgb^ 8.4 s/p 1 unit pRBC yesterday and 1 unit pRBC on 5/20. No ESA d/t lung mass.  B/l shoulder pain/limited ROM - MRI completed yesterday, with b/l tear of supraspinatus tendon.  Per PMD. ESRD - on HD MWF. Missed HD x 2 last week. HD today per regular schedule.  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 Continue binders. Not on VDRA. HTN/volume - BP elevated today.  Continue home meds - amlodipine 2.'5mg'$  qhs.  Hydralazine '25mg'$  prn also ordered. CXR 5/21 with evidence of volume overload, net UF 6.5L over the last 2 treatments. Under dry weight per bed weights, needs to be lowered on d/c.  Continue  to challenge and UF as tolerated.  Calciphylaxis - Hx penile calciphylaxis.  Continue Na thiosulfate.  Lung mass/Hepatocellular carcinoma- followed by Oncology. Holding ESA Nutrition - Renal diet w/fluid restrictions  Jen Mow, PA-C [place] 09/18/2021,8:00 AM  LOS: 4 days

## 2021-09-19 ENCOUNTER — Other Ambulatory Visit: Payer: Self-pay

## 2021-09-19 ENCOUNTER — Other Ambulatory Visit: Payer: Self-pay | Admitting: *Deleted

## 2021-09-19 ENCOUNTER — Telehealth: Payer: Self-pay

## 2021-09-19 ENCOUNTER — Inpatient Hospital Stay: Payer: Medicare Other

## 2021-09-19 ENCOUNTER — Other Ambulatory Visit (HOSPITAL_COMMUNITY): Payer: Self-pay | Admitting: Interventional Radiology

## 2021-09-19 ENCOUNTER — Telehealth (INDEPENDENT_AMBULATORY_CARE_PROVIDER_SITE_OTHER): Payer: Self-pay

## 2021-09-19 DIAGNOSIS — N186 End stage renal disease: Secondary | ICD-10-CM | POA: Diagnosis not present

## 2021-09-19 DIAGNOSIS — K921 Melena: Secondary | ICD-10-CM

## 2021-09-19 DIAGNOSIS — Z5112 Encounter for antineoplastic immunotherapy: Secondary | ICD-10-CM | POA: Diagnosis present

## 2021-09-19 DIAGNOSIS — C221 Intrahepatic bile duct carcinoma: Secondary | ICD-10-CM | POA: Diagnosis present

## 2021-09-19 DIAGNOSIS — D509 Iron deficiency anemia, unspecified: Secondary | ICD-10-CM | POA: Diagnosis not present

## 2021-09-19 DIAGNOSIS — Z992 Dependence on renal dialysis: Secondary | ICD-10-CM | POA: Diagnosis not present

## 2021-09-19 DIAGNOSIS — K922 Gastrointestinal hemorrhage, unspecified: Secondary | ICD-10-CM | POA: Diagnosis not present

## 2021-09-19 DIAGNOSIS — D631 Anemia in chronic kidney disease: Secondary | ICD-10-CM

## 2021-09-19 DIAGNOSIS — D500 Iron deficiency anemia secondary to blood loss (chronic): Secondary | ICD-10-CM | POA: Diagnosis not present

## 2021-09-19 DIAGNOSIS — C220 Liver cell carcinoma: Secondary | ICD-10-CM

## 2021-09-19 DIAGNOSIS — K31819 Angiodysplasia of stomach and duodenum without bleeding: Secondary | ICD-10-CM

## 2021-09-19 LAB — CBC WITH DIFFERENTIAL/PLATELET
Abs Immature Granulocytes: 0.02 10*3/uL (ref 0.00–0.07)
Basophils Absolute: 0 10*3/uL (ref 0.0–0.1)
Basophils Relative: 0 %
Eosinophils Absolute: 0.1 10*3/uL (ref 0.0–0.5)
Eosinophils Relative: 1 %
HCT: 26.1 % — ABNORMAL LOW (ref 39.0–52.0)
Hemoglobin: 8.9 g/dL — ABNORMAL LOW (ref 13.0–17.0)
Immature Granulocytes: 0 %
Lymphocytes Relative: 15 %
Lymphs Abs: 1 10*3/uL (ref 0.7–4.0)
MCH: 33.8 pg (ref 26.0–34.0)
MCHC: 34.1 g/dL (ref 30.0–36.0)
MCV: 99.2 fL (ref 80.0–100.0)
Monocytes Absolute: 0.8 10*3/uL (ref 0.1–1.0)
Monocytes Relative: 12 %
Neutro Abs: 5.1 10*3/uL (ref 1.7–7.7)
Neutrophils Relative %: 72 %
Platelets: 160 10*3/uL (ref 150–400)
RBC: 2.63 MIL/uL — ABNORMAL LOW (ref 4.22–5.81)
RDW: 21 % — ABNORMAL HIGH (ref 11.5–15.5)
WBC: 7.1 10*3/uL (ref 4.0–10.5)
nRBC: 0 % (ref 0.0–0.2)

## 2021-09-19 LAB — CMP (CANCER CENTER ONLY)
ALT: 10 U/L (ref 0–44)
AST: 36 U/L (ref 15–41)
Albumin: 3.3 g/dL — ABNORMAL LOW (ref 3.5–5.0)
Alkaline Phosphatase: 100 U/L (ref 38–126)
Anion gap: 9 (ref 5–15)
BUN: 21 mg/dL (ref 8–23)
CO2: 27 mmol/L (ref 22–32)
Calcium: 8.8 mg/dL — ABNORMAL LOW (ref 8.9–10.3)
Chloride: 97 mmol/L — ABNORMAL LOW (ref 98–111)
Creatinine: 4.67 mg/dL (ref 0.61–1.24)
GFR, Estimated: 13 mL/min — ABNORMAL LOW (ref >60)
Glucose, Bld: 99 mg/dL (ref 70–99)
Potassium: 4 mmol/L (ref 3.5–5.1)
Sodium: 133 mmol/L — ABNORMAL LOW (ref 135–145)
Total Bilirubin: 0.9 mg/dL (ref 0.3–1.2)
Total Protein: 8.3 g/dL — ABNORMAL HIGH (ref 6.5–8.1)

## 2021-09-19 LAB — FERRITIN: Ferritin: 369 ng/mL — ABNORMAL HIGH (ref 24–336)

## 2021-09-19 NOTE — Telephone Encounter (Signed)
FYI.   Copied from [place] [id number]. Topic: General - Other >> Sep 19, 2021  4:24 PM Bayard Beaver wrote: Reason for WNU:UVOZD from [facility], called in states he hasnt been able to schedule home health visit with patient for this wee She will reattempt next week, so for now she is closing it.

## 2021-09-19 NOTE — Telephone Encounter (Signed)
Transition Care Management Follow-up Telephone Call Date of discharge and from where: 09/18/2021, [facility] How have you been since you were released from the hospital? He stated he is feeling okay Any questions or concerns? No  Items Reviewed: Did the pt receive and understand the discharge instructions provided? Yes  Medications obtained and verified? Yes - he said he has all of his medications and did not have any questions about the med regime  Other? No  Any new allergies since your discharge? No  Dietary orders reviewed? No Do you have support at home?  He lives in a rooming house and he said he takes care of himself.   Home Care and Equipment/Supplies: Were home health services ordered? no If so, what is the name of the agency? N/a  Has the agency set up a time to come to the patient's home? not applicable Were any new equipment or medical supplies ordered?  No What is the name of the medical supply agency? N/a Were you able to get the supplies/equipment? not applicable Do you have any questions related to the use of the equipment or supplies? No  He attends HD: M/W/F at [facility]: (I = Independent and D = Dependent) ADLs: He uses a wheelchair for mobility.  Is independent with personal care.  He has a wheelchair from [employer] that he said is too big for him room.  He explained that [facility] was supposed to switch it out for him but he never heard from anyone.  I called [place], spoke to [place] about exchanging patient's wheelchair. She said she would need to get more information about this and call me bak.    Follow up appointments reviewed:  PCP Hospital f/u appt confirmed? Yes  Scheduled to see [name], NP -  10/01/2021.  [facility] f/u appt confirmed? Yes  Scheduled to see cardiology- 09/26/1021, oncology- 10/10/2021  Are transportation arrangements needed? No - he drives If their condition worsens, is the  pt aware to call PCP or go to the Emergency Dept.? Yes Was the patient provided with contact information for the PCP's office or ED? Yes Was to pt encouraged to call back with questions or concerns? Yes

## 2021-09-19 NOTE — Telephone Encounter (Signed)
Pt's Scr+ today 4.67 reported by Maudie Mercury in Lab.  Dialysis Pt. Notified [name].

## 2021-09-19 NOTE — Patient Outreach (Addendum)
[facility]) Care Management Geriatric Nurse Practitioner Note   09/19/2021 Name:  KYZER BLOWE MRN:  [id number] DOB:  1960-10-25  Summary: Discharged from hospital 09/17/21 for anemia, blood loss.  Recommendations/Changes made from today's visit: Call housing options for appt to go fill out applications. Enlist family member or friend to assist with visiting locations.  Subjective: VIDAL LAMPKINS is an 61 y.o. year old male who is a primary patient of [name], NP. The care management team was consulted for assistance with care management and/or care coordination needs.    Geriatric Nurse Practitioner completed Telephone Visit today.   Patient was recently discharged from hospital and all medications have been reviewed. Medications Reviewed Today     Reviewed by Deloria Lair, NP (Nurse Practitioner) on 09/19/21 at 1116  Med List Status: <None>   Medication Order Taking? Sig Documenting Provider Last Dose Status Informant  amLODipine (NORVASC) 5 MG tablet [id number]  Take 1 tablet (5 mg total) by mouth daily. [place] Eric J, DO  Active   aspirin EC 81 MG tablet [id number]  Take 1 tablet (81 mg total) by mouth daily. [place] Donnamarie Poag, DO  Active   atorvastatin (LIPITOR) 40 MG tablet [id number]  Take 1 tablet (40 mg total) by mouth daily. [place] Donnamarie Poag, DO  Active   calcium acetate, Phos Binder, (PHOSLYRA) 667 MG/5ML SOLN [id number]  Take 5 mLs (667 mg total) by mouth 3 (three) times daily with meals. [place] Eric J, DO  Active   Discontinued 07/29/20 1032 Darbepoetin Alfa (ARANESP) 100 MCG/0.5ML SOSY injection [id number] No Inject 0.5 mLs (100 mcg total) into the vein every 7 (seven) days for 8 doses.  Patient not taking: Reported on 09/15/2021   Bonnell Public, MD Not Taking Active Self           Med Note (COFFELL, Dionne Bucy   Sun Sep 15, 2021  1:33 PM) Pt said he does not get this medication.  Discontinued 02/09/20 1843          Med Note  Ebony Hail, AMBER B   Mon Oct 17, 2019  7:53 AM)    gabapentin (NEURONTIN) 100 MG capsule [id number]  Take 1 capsule (100 mg total) by mouth 3 (three) times daily. [place] Donnamarie Poag, DO  Active   HYDROcodone-acetaminophen (NORCO/VICODIN) 5-325 MG tablet [id number]  Take 1 tablet by mouth every 4 (four) hours as needed for up to 7 days for severe pain. [place] Donnamarie Poag, DO  Active   methocarbamol (ROBAXIN) 500 MG tablet [id number] No Take 1 tablet (500 mg total) by mouth every 6 (six) hours as needed for muscle spasms. Bonnielee Haff, MD Past Week Active   pantoprazole (PROTONIX) 40 MG tablet [id number]  Take 1 tablet (40 mg total) by mouth 2 (two) times daily before a meal. [place] Donnamarie Poag, DO  Active   traZODone (DESYREL) 50 MG tablet [id number]  Take 1 tablet (50 mg total) by mouth at bedtime as needed for sleep. [place] Donnamarie Poag, DO  Active            SDOH:  (Social Determinants of Health) assessments and interventions performed: Needs housing. Pt has not initiated seach himself although provided resources.  Care Plan  Review of patient past medical history, allergies, medications, health status, including review of consultants reports, laboratory and other test data, was performed as part of comprehensive evaluation for care management services.   Care Plan : [facility] NP Plan of Care  Updates made by  Deloria Lair, NP since 09/19/2021 12:00 AM     Problem: Needs handicapped accessible living quarters.   Priority: High  Onset Date: 08/29/2021     Goal: Patient will review and fill out applications for appropriate housing over the next 30 days.   Start Date: 08/29/2021  Expected End Date: 10/01/2021  This Visit's Progress: Not on track  Priority: High  Note:    Update 09/19/21:  (Status: Goal on track: NO.) Short Term Goal  Evaluation of current treatment plan related to FINDING HOUSING and patient's adherence to plan as established by provider Pt just discharged from the hospital for 4 day stay  anemia requiring 3 pints of blood. 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 He goes to dialysis M-W-F. He has had numerous other MD appts. Talked with him today about his progress in seeking housing and he said he can't do that because he cannot get in and out of his car without assistance. He is a BKA and uses a W/C. NP requested that he call the housing areas that he would like to live in and make and appt to go fill out an application and view the apts. Suggested he could make several appts on the same day and enlist a family member or friend to go with him to assist. He was not happy with the suggestion but he said OK. He says he is supposed to be out of his current housing situation on 09/28/21. He had known about this for some time now and had been provided the information a month ago for available housing options. NP will call LCSW on suggestions for housing that are likely to have openings for quick move in date.  Current Barriers:  Self motivation  RNCM Clinical Goal(s):  Patient will work with care team [facility] NP, [place] CSW, potential housing locations) to choose appropriate housing and complete applications as evidenced by pt report   through collaboration with Consulting civil engineer, provider, and care team.   Interventions: Inter-disciplinary care team collaboration (see longitudinal plan of care) Evaluation of current treatment plan related to  self management and patient's adherence to plan as established by provider  Patient Goals/Self-Care Activities: Initiate process of finding new housing with resources given.      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   Start Date: 08/29/2021  Expected End Date: 09/30/2021  Priority: Medium  Note:    Update 09/19/21:  (Status: Goal on track: NO.) Short Term Goal  Evaluation of current treatment plan related to ATTENDING ONCOLOGY APPTS and  patient's adherence to plan as established by provider Pt has had one chemotherapy infusion,however, he denies having had any. He did get a blood transfusion the same day so that may have been confusing for him. He does have an appt on 09/26/21, 10/03/21, 10/10/21 and an Interventional Radiaology appt on 10/24/21 for a procedure. NP to follow and remind pt's of his appts.  Current Barriers:  Knowledge Deficits related to plan of care for management of possible new ca.  Chronic Disease Management support and education needs related to possible ca No Advanced Directives in place  RNCM Clinical Goal(s):  Patient will verbalize understanding of plan for management of possible ca as evidenced by pt report. attend all scheduled medical appointments: Oncology as evidenced by chart review        through  collaboration with Consulting civil engineer, provider, and care team.   Interventions: Inter-disciplinary care team collaboration (see longitudinal plan of care) Evaluation of current treatment plan related to  self management and patient's adherence to plan as established by provider  Patient Goals/Self-Care Activities: Attend all scheduled provider appointments        Plan: Telephone follow up appointment with care management team member scheduled for:  Next week. Pt agrees reluctantly to try to make inquiries and go to housing options . We will talk again next week.  [name], MSN, [facility] Gerontological Nurse Practitioner [facility] Care Management [phone]

## 2021-09-19 NOTE — Telephone Encounter (Signed)
Labs reported by [facility] in lab.    Hbg 8.9  Plts 160  Labs are not w/in blood product replacement range.

## 2021-09-24 ENCOUNTER — Ambulatory Visit: Payer: Medicare Other | Admitting: Orthopedic Surgery

## 2021-09-24 ENCOUNTER — Telehealth: Payer: Self-pay

## 2021-09-24 NOTE — Telephone Encounter (Signed)
Pt called to find out when his f/u appt is in July. He is c/o intermittent swelling of the stump and states he was not given a shrinker yet. I have advised him to call [facility], as they likely have rx for this. He verbalized understanding and will call us back if he has any issues/concerns.

## 2021-09-25 ENCOUNTER — Emergency Department (HOSPITAL_COMMUNITY): Payer: Medicare Other

## 2021-09-25 ENCOUNTER — Emergency Department (HOSPITAL_COMMUNITY)
Admission: EM | Admit: 2021-09-25 | Discharge: 2021-09-25 | Disposition: A | Discharge status: Home / Self Care | Payer: Medicare Other | Attending: Emergency Medicine | Admitting: Emergency Medicine

## 2021-09-25 ENCOUNTER — Encounter (HOSPITAL_COMMUNITY): Payer: Self-pay

## 2021-09-25 DIAGNOSIS — M25511 Pain in right shoulder: Secondary | ICD-10-CM | POA: Insufficient documentation

## 2021-09-25 DIAGNOSIS — Z79899 Other long term (current) drug therapy: Secondary | ICD-10-CM | POA: Insufficient documentation

## 2021-09-25 DIAGNOSIS — I10 Essential (primary) hypertension: Secondary | ICD-10-CM | POA: Insufficient documentation

## 2021-09-25 DIAGNOSIS — Z7982 Long term (current) use of aspirin: Secondary | ICD-10-CM | POA: Diagnosis not present

## 2021-09-25 MED ORDER — OXYCODONE-ACETAMINOPHEN 5-325 MG PO TABS: 1.0000 | ORAL_TABLET | Freq: Four times a day (QID) | ORAL | 0 refills | Status: DC | PRN

## 2021-09-25 MED ORDER — DIPHENHYDRAMINE HCL 25 MG PO CAPS
25.0000 mg | ORAL_CAPSULE | Freq: Once | ORAL | Status: AC
  Administered 2021-09-25: 25 mg via ORAL
  Filled 2021-09-25: qty 1

## 2021-09-25 MED ORDER — OXYCODONE-ACETAMINOPHEN 5-325 MG PO TABS
1.0000 | ORAL_TABLET | Freq: Once | ORAL | Status: AC
  Administered 2021-09-25: 1 via ORAL
  Filled 2021-09-25: qty 1

## 2021-09-25 NOTE — ED Triage Notes (Signed)
Chronic bilateral shoulder pain for months. No injury/falls. Full ROM on BUE.

## 2021-09-25 NOTE — Discharge Instructions (Signed)
Follow-up with Dr. Zachery Dakins or one of his associates in the next few weeks to check your shoulder

## 2021-09-25 NOTE — Progress Notes (Unsigned)
Cardiology Office Note:    Date:  09/26/2021   ID:  Daniel [name], DOB 1960/06/13, MRN [id number]  PCP:  [name], NP [place] Cardiologist: Quay Burow, MD  Reason for visit: 58-monthfollow-up  History of Present Illness:    Daniel CAPOZZOLIis a 61y.o. male with a hx of tobacco use, hypertension, diabetes, PAD status post left BKA.  Peripheral angiogram performed by Dr. CDonzetta Matters12/05/2020 showing no endovascular surgical options for limb salvage.  He was admitted to the hospital on 06/13/2021 with chest pain.  His troponins were flat.  2D echo was normal.  He continued to have atypical chest pain as follow-up with Dr. BGwenlyn Foundin March 2023.  CTA of the coronaries ordered -- not done.  He went to the ED 5/31 (yesterday) with bilateral shoulder pain.  He was told this is possibly secondary to rotator cuff issues and told to follow-up with orthopedics.    Today, he states he has not had any further chest tightness since his ED visit in February.  He is wheelchair-bound following BKA.  States he has prosthesis fitting scheduled in July.  He has intermittent shortness of breath.  He denies history of pulmonary evaluation.  He is a long-term smoker currently half pack per day for last 45 years.  He denies PND, orthopnea, palpitations, lightheadedness and syncope.  He has mild edema.         Past Medical History:  Diagnosis Date   Anemia    Diabetes mellitus without complication ([facility]    patient denies   Dialysis patient ([facility]    End stage chronic kidney disease ([place]    Hypertension    ICH (intracerebral hemorrhage) ([facility] 05/20/2017   PAD (peripheral artery disease) (HCC)    Shoulder pain, left 06/28/2013    Past Surgical History:  Procedure Laterality Date   A/V FISTULAGRAM N/A 08/15/2020   Procedure: A/V FISTULAGRAM - Left Upper;  Surgeon: HCherre Robins MD;  [place];  Service: Cardiovascular;  Laterality: N/A;   ABDOMINAL AORTOGRAM  W/LOWER EXTREMITY N/A 04/08/2021   Procedure: ABDOMINAL AORTOGRAM W/LOWER EXTREMITY;  Surgeon: CWaynetta Sandy MD;  [place];  Service: Cardiovascular;  Laterality: N/A;   AMPUTATION Left 04/16/2021   Procedure: LEFT BELOW KNEE AMPUTATION;  Surgeon: DAngelia Mould MD;  [place]  Service: Vascular;  Laterality: Left;   AMPUTATION Left 06/17/2021   Procedure: REVISION AMPUTATION BELOW KNEE;  Surgeon: RBroadus John MD;  [place]  Service: Vascular;  Laterality: Left;   APPENDECTOMY     APPLICATION OF WOUND VAC Left 06/17/2021   Procedure: APPLICATION OF WOUND VAC;  Surgeon: RBroadus John MD;  [place]: [place]  Service: Vascular;  Laterality: Left;   AV FISTULA PLACEMENT Left 08/03/2019   Procedure: LEFT ARM ARTERIOVENOUS (AV) CEPHALIC  FISTULA CREATION;  Surgeon: CWaynetta Sandy MD;  [place]  Service: Vascular;  Laterality: Left;   BIOPSY  06/30/2019   Procedure: BIOPSY;  Surgeon: BRonald Lobo MD;  [facility];  Service: Endoscopy;;   BIOPSY  08/02/2019   Procedure: BIOPSY;  Surgeon: AYetta Flock MD;  [facility];  Service: Gastroenterology;;   BIOPSY  02/08/2021   Procedure: BIOPSY;  Surgeon: DSharyn Creamer MD;  [facility]  Service: Gastroenterology;;   BIOPSY  02/24/2021   Procedure: BIOPSY;  Surgeon: CDaryel November MD;  [place]  Service: Gastroenterology;;  BIOPSY  03/26/2021   Procedure: BIOPSY;  Surgeon: Lavena Bullion, DO;  Location: [place];  Service: Gastroenterology;;   BIOPSY  08/06/2021   Procedure: BIOPSY;  Surgeon: [name], MD;  Location: [place];  Service: Gastroenterology;;   BIOPSY  09/15/2021   Procedure: BIOPSY;  Surgeon: Sharyn Creamer, MD;  Location: [place];  Service: Gastroenterology;;   COLONOSCOPY  01/23/2012   Procedure: COLONOSCOPY;  Surgeon: Danie Binder, MD;  [facility];   Service: Endoscopy;  Laterality: N/A;  11:10 AM   COLONOSCOPY WITH PROPOFOL N/A 06/30/2019   Procedure: COLONOSCOPY WITH PROPOFOL;  Surgeon: Ronald Lobo, MD;  Location: [place];  Service: Endoscopy;  Laterality: N/A;   ENTEROSCOPY N/A 08/02/2019   Procedure: ENTEROSCOPY;  Surgeon: Yetta Flock, MD;  Location: [facility] ENDOSCOPY;  Service: Gastroenterology;  Laterality: N/A;   ENTEROSCOPY N/A 02/24/2021   Procedure: ENTEROSCOPY;  Surgeon: [name], MD;  Location: [facility] ENDOSCOPY;  Service: Gastroenterology;  Laterality: N/A;   ENTEROSCOPY N/A 03/26/2021   Procedure: ENTEROSCOPY;  Surgeon: Lavena Bullion, DO;  Location: [place];  Service: Gastroenterology;  Laterality: N/A;   ENTEROSCOPY N/A 07/05/2021   Procedure: ENTEROSCOPY;  Surgeon: Carol Ada, MD;  Location: [place];  Service: Gastroenterology;  Laterality: N/A;   ENTEROSCOPY N/A 08/06/2021   Procedure: ENTEROSCOPY;  Surgeon: [name], MD;  Location: [facility] ENDOSCOPY;  Service: Gastroenterology;  Laterality: N/A;   ENTEROSCOPY N/A 08/24/2021   Procedure: ENTEROSCOPY;  Surgeon: Ladene Artist, MD;  Location: [facility] ENDOSCOPY;  Service: Gastroenterology;  Laterality: N/A;   ENTEROSCOPY N/A 09/15/2021   Procedure: ENTEROSCOPY;  Surgeon: Sharyn Creamer, MD;  Location: [facility] ENDOSCOPY;  Service: Gastroenterology;  Laterality: N/A;   ESOPHAGOGASTRODUODENOSCOPY N/A 08/10/2020   Procedure: ESOPHAGOGASTRODUODENOSCOPY (EGD);  Surgeon: Jerene Bears, MD;  Location: [facility] ENDOSCOPY;  Service: Gastroenterology;  Laterality: N/A;   ESOPHAGOGASTRODUODENOSCOPY (EGD) WITH PROPOFOL N/A 06/30/2019   Procedure: ESOPHAGOGASTRODUODENOSCOPY (EGD) WITH PROPOFOL;  Surgeon: Ronald Lobo, MD;  Location: [place];  Service: Endoscopy;  Laterality: N/A;   ESOPHAGOGASTRODUODENOSCOPY (EGD) WITH PROPOFOL N/A 01/12/2021   Procedure: ESOPHAGOGASTRODUODENOSCOPY (EGD) WITH PROPOFOL;  Surgeon: Sharyn Creamer, MD;  Location: [place];  Service:  Gastroenterology;  Laterality: N/A;   ESOPHAGOGASTRODUODENOSCOPY (EGD) WITH PROPOFOL N/A 02/08/2021   Procedure: ESOPHAGOGASTRODUODENOSCOPY (EGD) WITH PROPOFOL;  Surgeon: Sharyn Creamer, MD;  Location: [place];  Service: Gastroenterology;  Laterality: N/A;   FISTULA SUPERFICIALIZATION Left 10/17/2019   Procedure: LEFT UPPER EXTREMITY FISTULA REVISION, SIDE BRANCH LIGATION,  AND SUPERFICIALIZATION;  Surgeon: Marty Heck, MD;  Location: [place];  Service: Vascular;  Laterality: Left;   FISTULA SUPERFICIALIZATION Left 47/65/4650   Procedure: PLICATION OF ANEURYSM LEFT ARTERIOVENOUS FISTULA;  Surgeon: Angelia Mould, MD;  Location: [place];  Service: Vascular;  Laterality: Left;   FLEXIBLE SIGMOIDOSCOPY N/A 07/05/2021   Procedure: FLEXIBLE SIGMOIDOSCOPY;  Surgeon: Carol Ada, MD;  Location: [place];  Service: Gastroenterology;  Laterality: N/A;   GIVENS CAPSULE STUDY N/A 06/30/2019   Procedure: GIVENS CAPSULE STUDY;  Surgeon: Ronald Lobo, MD;  Location: [place];  Service: Endoscopy;  Laterality: N/A;   GIVENS CAPSULE STUDY N/A 08/25/2021   Procedure: GIVENS CAPSULE STUDY;  Surgeon: Ladene Artist, MD;  Location: [place];  Service: Gastroenterology;  Laterality: N/A;   HEMOSTASIS CLIP PLACEMENT  08/10/2020   Procedure: HEMOSTASIS CLIP PLACEMENT;  Surgeon: Jerene Bears, MD;  Location: [facility]COPY;  Service: Gastroenterology;;   HEMOSTASIS CLIP PLACEMENT  01/12/2021  Procedure: HEMOSTASIS CLIP PLACEMENT;  Surgeon: Sharyn Creamer, MD;  Location: [place];  Service: Gastroenterology;;   HEMOSTASIS CONTROL  08/02/2019   Procedure: HEMOSTASIS CONTROL;  Surgeon: Yetta Flock, MD;  Location: [place];  Service: Gastroenterology;;   HOT HEMOSTASIS  02/24/2021   Procedure: HOT HEMOSTASIS (ARGON PLASMA COAGULATION/BICAP);  Surgeon: [name], MD;  Location: [facility] ENDOSCOPY;  Service: Gastroenterology;;   HOT HEMOSTASIS N/A 03/26/2021   Procedure: HOT  HEMOSTASIS (ARGON PLASMA COAGULATION/BICAP);  Surgeon: Lavena Bullion, DO;  Location: [facility] ENDOSCOPY;  Service: Gastroenterology;  Laterality: N/A;   HOT HEMOSTASIS N/A 07/05/2021   Procedure: HOT HEMOSTASIS (ARGON PLASMA COAGULATION/BICAP);  Surgeon: Carol Ada, MD;  Location: [place];  Service: Gastroenterology;  Laterality: N/A;   HOT HEMOSTASIS N/A 09/15/2021   Procedure: HOT HEMOSTASIS (ARGON PLASMA COAGULATION/BICAP);  Surgeon: Sharyn Creamer, MD;  Location: [place];  Service: Gastroenterology;  Laterality: N/A;   INCISION AND DRAINAGE ABSCESS N/A 06/29/2016   Procedure: INCISION AND DRAINAGE ABDOMINAL WALL ABSCESS;  Surgeon: Alphonsa Overall, MD;  [facility];  Service: General;  Laterality: N/A;   INSERTION OF DIALYSIS CATHETER Right 08/03/2019   Procedure: INSERTION OF DIALYSIS CATHETER;  Surgeon: Waynetta Sandy, MD;  Location: [place];  Service: Vascular;  Laterality: Right;   INSERTION OF DIALYSIS CATHETER Right 10/22/2019   Procedure: INSERTION OF 23CM TUNNELED DIALYSIS CATHETER RIGHT INTERNAL JUGULAR;  Surgeon: Angelia Mould, MD;  Location: [place];  Service: Vascular;  Laterality: Right;   INSERTION OF DIALYSIS CATHETER Right 08/12/2020   Procedure: INSERTION OF Right internal Jugular TUNNELED  DIALYSIS CATHETER.;  Surgeon: Waynetta Sandy, MD;  Location: [place];  Service: Vascular;  Laterality: Right;   INSERTION OF DIALYSIS CATHETER N/A 04/19/2021   Procedure: INSERTION OF TUNNELED DIALYSIS CATHETER;  Surgeon: Angelia Mould, MD;  Location: [facility];  Service: Vascular;  Laterality: N/A;   IR RADIOLOGIST EVAL & MGMT  09/12/2021   Left heel surgery     LOWER EXTREMITY ANGIOGRAPHY N/A 07/08/2021   Procedure: Lower Extremity Angiography;  Surgeon: Cherre Robins, MD;  Location: [place] CV LAB;  Service: Cardiovascular;  Laterality: N/A;   PENILE BIOPSY N/A 03/26/2020   Procedure: PENILE ULCER DEBRIDEMENT;  Surgeon: Remi Haggard, MD;   [facility];  Service: Urology;  Laterality: N/A;  30 MINS   PERIPHERAL VASCULAR INTERVENTION Right 07/08/2021   Procedure: PERIPHERAL VASCULAR INTERVENTION;  Surgeon: Cherre Robins, MD;  Location: [place] CV LAB;  Service: Cardiovascular;  Laterality: Right;   SCLEROTHERAPY  01/12/2021   Procedure: SCLEROTHERAPY;  Surgeon: Sharyn Creamer, MD;  Location: [facility] ENDOSCOPY;  Service: Gastroenterology;;   Clide Deutscher  02/24/2021   Procedure: Clide Deutscher;  Surgeon: [name], MD;  Location: [place];  Service: Gastroenterology;;   SUBMUCOSAL TATTOO INJECTION  08/24/2021   Procedure: SUBMUCOSAL TATTOO INJECTION;  Surgeon: Ladene Artist, MD;  Location: [place];  Service: Gastroenterology;;   SUBMUCOSAL TATTOO INJECTION  09/15/2021   Procedure: SUBMUCOSAL TATTOO INJECTION;  Surgeon: Sharyn Creamer, MD;  Location: [facility] ENDOSCOPY;  Service: Gastroenterology;;    Current Medications: Current Meds  Medication Sig   amLODipine (NORVASC) 5 MG tablet Take 1 tablet (5 mg total) by mouth daily.   aspirin EC 81 MG tablet Take 1 tablet (81 mg total) by mouth daily.   atorvastatin (LIPITOR) 40 MG tablet Take 1 tablet (40 mg total) by mouth daily.   calcium acetate, Phos Binder, (PHOSLYRA) 667 MG/5ML SOLN Take  5 mLs (667 mg total) by mouth 3 (three) times daily with meals.   gabapentin (NEURONTIN) 100 MG capsule Take 1 capsule (100 mg total) by mouth 3 (three) times daily.   ivabradine (CORLANOR) 5 MG TABS tablet Take 1 tablet (5 mg total) by mouth 2 (two) times daily with a meal. Take 2 Tablets 90-120 minutes prior to Scan.   methocarbamol (ROBAXIN) 500 MG tablet Take 1 tablet (500 mg total) by mouth every 6 (six) hours as needed for muscle spasms.   metoprolol tartrate (LOPRESSOR) 100 MG tablet Take 1 tablet (100 mg total) by mouth 2 (two) times daily. Take 1 Tablet 90-120 minutes prior to scan.   oxyCODONE-acetaminophen (PERCOCET) 5-325 MG tablet Take 1 tablet by mouth every 6  (six) hours as needed.   pantoprazole (PROTONIX) 40 MG tablet Take 1 tablet (40 mg total) by mouth 2 (two) times daily before a meal.   traZODone (DESYREL) 50 MG tablet Take 1 tablet (50 mg total) by mouth at bedtime as needed for sleep.     Allergies:   Seroquel [quetiapine] and Dilaudid [hydromorphone hcl]   Social History   Socioeconomic History   Marital status: Single    Spouse name: Not on file   Number of children: 2   Years of education: 12   Highest education level: 12th grade  Occupational History   Occupation: disabled  Tobacco Use   Smoking status: Every Day    Packs/day: 0.50    Years: 45.00    Pack years: 22.50    Types: Cigarettes   Smokeless tobacco: Never   Tobacco comments:    Pt left before information given  Vaping Use   Vaping Use: Never used  Substance and Sexual Activity   Alcohol use: Not Currently   Drug use: Not Currently    Types: "Crack" cocaine    Comment: last in 2020   Sexual activity: Yes    Birth control/protection: None  Other Topics Concern   Not on file  Social History Narrative   Goes to dialysis M-W-F, LBKA using a wheelchair at present, waiting for prosthetic. Looking for handicapped housing for immediate move in.   Social Determinants of Health   Financial Resource Strain: Not on file  Food Insecurity: No Food Insecurity   Worried About Charity fundraiser in the Last Year: Never true   Ran Out of Food in the Last Year: Never true  Transportation Needs: No Transportation Needs   Lack of Transportation (Medical): No   Lack of Transportation (Non-Medical): No  Physical Activity: Not on file  Stress: Not on file  Social Connections: Not on file     Family History: The patient's family history is negative for Colon cancer.  ROS:   Please see the history of present illness.     EKGs/Labs/Other Studies Reviewed:    Recent Labs: 03/24/2021: TSH 3.465 06/14/2021: B Natriuretic Peptide 521.8 08/19/2021: Magnesium  2.3 09/19/2021: ALT 10; BUN 21; Creatinine 4.67; Hemoglobin 8.9; Platelets 160; Potassium 4.0; Sodium 133   Recent Lipid Panel Lab Results  Component Value Date/Time   CHOL 103 12/28/2019 04:40 PM   TRIG 235 (H) 12/28/2019 04:40 PM   HDL 35 (L) 12/28/2019 04:40 PM   LDLCALC 32 12/28/2019 04:40 PM    Physical Exam:    VS:  BP 130/85 (BP Location: Left Arm)   Pulse 91   Ht '5\' 11"'$  (1.803 m)   Wt 244 lb 9.6 oz (110.9 kg)   SpO2 99%  BMI 34.11 kg/m    No data found.  Wt Readings from Last 3 Encounters:  09/26/21 244 lb 9.6 oz (110.9 kg)  09/18/21 234 lb 5.6 oz (106.3 kg)  09/05/21 248 lb 12 oz (112.8 kg)     GEN: Well nourished, well developed in no acute distress, in wheelchair HEENT: Normal NECK: No JVD; No carotid bruits CARDIAC: RRR, no murmurs, rubs, gallops RESPIRATORY:  Clear to auscultation without rales, wheezing or rhonchi  ABDOMEN: Soft, non-tender, non-distended MUSCULOSKELETAL: trace edema; left BKA SKIN: Warm and dry NEUROLOGIC:  Alert and oriented PSYCHIATRIC:  Normal affect     ASSESSMENT AND PLAN   Precordial pain, improved - Admitted 06/13/2021 with atypical chest pain.  His troponins were flat.  His 2D echo showed normal LV function although he was volume overloaded.  His EKG showed no acute changes.  It was suggested that he have a coronary CTA to further evaluate though never done. -With history of tobacco use and PAD, will follow through with coronary CTA scheduling.  With HR 91, take metoprolol to tartrate '100mg'$  + ivabradine 10 mg prior to scan.  PAD -History of severe PAD status post recent angiogram by Dr. Donzetta Matters 03/29/2021 because of critical ischemia.  There were no endovascular surgical options and he ended up having a left BKA.   Hypertension, reasonably controlled -Continue amlodipine.   -Followed by nephrology -Goal BP is <130/80.    Hyperlipidemia -LDL 32 in September 2021.  Continue Lipitor 40 mg daily.  Tobacco use  -Recommend  tobacco cessation.  Reviewed physiologic effects of nicotine and the immediate-eventual benefits of quitting including improvement in cough/breathing and reduction in cardiovascular events.  Discussed quitting tips such as removing triggers and getting support from family/friends and [employer]uitline [place]. -Patient defers on pharmacist referral for tobacco cessation aid.  Declines referral to pulmonology for COPD evaluation.  Disposition - Follow-up in 2 months to follow-up CTA results.   Medication Adjustments/Labs and Tests Ordered: Current medicines are reviewed at length with the patient today.  Concerns regarding medicines are outlined above.  No orders of the defined types were placed in this encounter.  Meds ordered this encounter  Medications   ivabradine (CORLANOR) 5 MG TABS tablet    Sig: Take 1 tablet (5 mg total) by mouth 2 (two) times daily with a meal. Take 2 Tablets 90-120 minutes prior to Scan.    Dispense:  2 tablet    Refill:  0   metoprolol tartrate (LOPRESSOR) 100 MG tablet    Sig: Take 1 tablet (100 mg total) by mouth 2 (two) times daily. Take 1 Tablet 90-120 minutes prior to scan.    Dispense:  1 tablet    Refill:  0    Patient Instructions  Medication Instructions:  No Changes *If you need a refill on your cardiac medications before your next appointment, please call your pharmacy*   Lab Work: No Labs If you have labs (blood work) drawn today and your tests are completely normal, you will receive your results only by: [place] (if you have MyChart) OR A paper copy in the mail If you have any lab test that is abnormal or we need to change your treatment, we will call you to review the results.   Testing/Procedures:   Your cardiac CT will be scheduled at one of the below locations:   [facility] [address] [place], [place] [address] [phone]   If scheduled at [facility], please arrive at the  Women's and Children's  Entrance (Entrance C2) of [facility] 30 minutes prior to test start time. You can use the FREE valet parking offered at entrance C (encouraged to control the heart rate for the test)  Proceed to the [facility] Radiology Department (first floor) to check-in and test prep.  All radiology patients and guests should use entrance C2 at [facility], accessed from [facility], even though the hospital's physical address listed is [address].    If scheduled at [facility], please arrive 15 mins early for check-in and test prep.  Please follow these instructions carefully (unless otherwise directed):  Hold all erectile dysfunction medications at least 3 days (72 hrs) prior to test.  On the Night Before the Test: Be sure to Drink plenty of water. Do not consume any caffeinated/decaffeinated beverages or chocolate 12 hours prior to your test. Do not take any antihistamines 12 hours prior to your test. If the patient has contrast allergy: Patient will need a prescription for Prednisone and very clear instructions (as follows): Prednisone 50 mg - take 13 hours prior to test Take another Prednisone 50 mg 7 hours prior to test Take another Prednisone 50 mg 1 hour prior to test Take Benadryl 50 mg 1 hour prior to test Patient must complete all four doses of above prophylactic medications. Patient will need a ride after test due to Benadryl.  On the Day of the Test: Drink plenty of water until 1 hour prior to the test. Do not eat any food 4 hours prior to the test. You may take your regular medications prior to the test.  Take metoprolol (Lopressor) two hours prior to test. HOLD Furosemide/Hydrochlorothiazide morning of the test. FEMALES- please wear underwire-free bra if available, avoid dresses & tight clothing   *For Clinical Staff only. Please instruct patient the following:* Heart Rate Medication Recommendations for Cardiac  CT  Resting HR < 50 bpm  No medication  Resting HR 50-60 bpm and BP >110/50 mmHG   Consider Metoprolol tartrate 25 mg PO 90-120 min prior to scan  Resting HR 60-65 bpm and BP >110/50 mmHG  Metoprolol tartrate 50 mg PO 90-120 minutes prior to scan   Resting HR > 65 bpm and BP >110/50 mmHG  Metoprolol tartrate 100 mg PO 90-120 minutes prior to scan  Consider Ivabradine 10-15 mg PO or a calcium channel blocker for resting HR >60 bpm and contraindication to metoprolol tartrate  Consider Ivabradine 10-15 mg PO in combination with metoprolol tartrate for HR >80 bpm         After the Test: Drink plenty of water. After receiving IV contrast, you may experience a mild flushed feeling. This is normal. On occasion, you may experience a mild rash up to 24 hours after the test. This is not dangerous. If this occurs, you can take Benadryl 25 mg and increase your fluid intake. If you experience trouble breathing, this can be serious. If it is severe call 911 IMMEDIATELY. If it is mild, please call our office. If you take any of these medications: Glipizide/Metformin, Avandament, Glucavance, please do not take 48 hours after completing test unless otherwise instructed.  We will call to schedule your test 2-4 weeks out understanding that some insurance companies will need an authorization prior to the service being performed.   For non-scheduling related questions, please contact the cardiac imaging nurse navigator should you have any questions/concerns: Marchia Bond, Cardiac Imaging Nurse Navigator Gordy Clement, Cardiac  Imaging Nurse [place] [facility] Services Direct Office Dial: [phone]   For scheduling needs, including cancellations and rescheduling, please call [place], [phone].    Follow-Up: At [facility], you and your health needs are our priority.  As part of our continuing mission to provide you with exceptional heart care, we have created designated  Provider Care Teams.  These Care Teams include your primary Cardiologist (physician) and Advanced Practice Providers (APPs -  Physician Assistants and Nurse Practitioners) who all work together to provide you with the care you need, when you need it.  We recommend signing up for the patient portal called "MyChart".  Sign up information is provided on this After Visit Summary.  MyChart is used to connect with patients for Virtual Visits (Telemedicine).  Patients are able to view lab/test results, encounter notes, upcoming appointments, etc.  Non-urgent messages can be sent to your provider as well.   To learn more about what you can do with MyChart, go to [email or web address].    Your next appointment:   2 month(s)  The format for your next appointment:   In Person  Provider:   Caron Presume, PA-C    Then, Quay Burow, MD will plan to see you again in 6 month(s).           Signed, Warren Lacy, PA-C  09/26/2021 10:20 AM    [place]

## 2021-09-25 NOTE — ED Provider Notes (Signed)
[facility] EMERGENCY DEPARTMENT Provider Note   CSN: [id number] Arrival date & time: 09/25/21  2751     History  Chief Complaint  Patient presents with   Shoulder Pain    Daniel [name] is a 61 y.o. male.  Patient complains of right shoulder pain.  He has a history of chronic pain in both shoulders.  Patient has a history of hypertension and renal failure  The history is provided by the patient and medical records. No language interpreter was used.  Shoulder Pain Location:  Shoulder Shoulder location:  R shoulder Pain details:    Quality:  Aching   Radiates to:  Does not radiate   Severity:  Moderate   Onset quality:  Gradual   Timing:  Constant   Progression:  Worsening Handedness:  Right-handed Dislocation: no   Foreign body present:  No foreign bodies Tetanus status:  Unknown Associated symptoms: no back pain and no fatigue       Home Medications Prior to Admission medications   Medication Sig Start Date End Date Taking? Authorizing Provider  oxyCODONE-acetaminophen (PERCOCET) 5-325 MG tablet Take 1 tablet by mouth every 6 (six) hours as needed. 09/25/21  Yes Milton Ferguson, MD  amLODipine (NORVASC) 5 MG tablet Take 1 tablet (5 mg total) by mouth daily. 09/18/21 12/17/21  [place] Donnamarie Poag, DO  aspirin EC 81 MG tablet Take 1 tablet (81 mg total) by mouth daily. 09/18/21 12/17/21  [place] Donnamarie Poag, DO  atorvastatin (LIPITOR) 40 MG tablet Take 1 tablet (40 mg total) by mouth daily. 09/18/21 12/17/21  [place] Eric J, DO  calcium acetate, Phos Binder, (PHOSLYRA) 667 MG/5ML SOLN Take 5 mLs (667 mg total) by mouth 3 (three) times daily with meals. 09/18/21 12/17/21  [place] Donnamarie Poag, DO  Darbepoetin Alfa (ARANESP) 100 MCG/0.5ML SOSY injection Inject 0.5 mLs (100 mcg total) into the vein every 7 (seven) days for 8 doses. Patient not taking: Reported on 09/15/2021 08/19/21 10/08/21  Dana Allan I, MD  gabapentin (NEURONTIN) 100 MG capsule Take 1 capsule (100  mg total) by mouth 3 (three) times daily. 09/18/21 12/17/21  [place] Eric J, DO  HYDROcodone-acetaminophen (NORCO/VICODIN) 5-325 MG tablet Take 1 tablet by mouth every 4 (four) hours as needed for up to 7 days for severe pain. 09/18/21 09/25/21  [place] Donnamarie Poag, DO  methocarbamol (ROBAXIN) 500 MG tablet Take 1 tablet (500 mg total) by mouth every 6 (six) hours as needed for muscle spasms. 08/27/21   Bonnielee Haff, MD  pantoprazole (PROTONIX) 40 MG tablet Take 1 tablet (40 mg total) by mouth 2 (two) times daily before a meal. 09/18/21 12/17/21  [place] Donnamarie Poag, DO  traZODone (DESYREL) 50 MG tablet Take 1 tablet (50 mg total) by mouth at bedtime as needed for sleep. 09/18/21 10/18/21  [place] Eric J, DO  colchicine 0.6 MG tablet Take 0.5 tablets (0.3 mg total) by mouth 2 (two) times daily. 07/24/20 07/29/20  Noemi Chapel, MD  furosemide (LASIX) 40 MG tablet Take 1 tablet (40 mg total) by mouth daily. 07/25/19 02/09/20  Ladona Horns, MD      Allergies    Seroquel [quetiapine] and Dilaudid [hydromorphone hcl]    Review of Systems   Review of Systems  Constitutional:  Negative for appetite change and fatigue.  HENT:  Negative for congestion, ear discharge and sinus pressure.   Eyes:  Negative for discharge.  Respiratory:  Negative for cough.   Cardiovascular:  Negative for chest pain.  Gastrointestinal:  Negative  for abdominal pain and diarrhea.  Genitourinary:  Negative for frequency and hematuria.  Musculoskeletal:  Negative for back pain.       Right shoulder pain  Skin:  Negative for rash.  Neurological:  Negative for seizures and headaches.  Psychiatric/Behavioral:  Negative for hallucinations.    Physical Exam Updated Vital Signs BP (!) 184/87 (BP Location: Right Arm)   Pulse 95   Temp 98 F (36.7 C) (Oral)   Resp 15   SpO2 100%  Physical Exam Vitals and nursing note reviewed.  Constitutional:      Appearance: He is well-developed.  HENT:     Head: Normocephalic.     Nose: Nose  normal.  Eyes:     General: No scleral icterus.    Conjunctiva/sclera: Conjunctivae normal.  Neck:     Thyroid: No thyromegaly.  Cardiovascular:     Rate and Rhythm: Normal rate and regular rhythm.     Heart sounds: No murmur heard.   No friction rub. No gallop.  Pulmonary:     Breath sounds: No stridor. No wheezing or rales.  Chest:     Chest wall: No tenderness.  Abdominal:     General: There is no distension.     Tenderness: There is no abdominal tenderness. There is no rebound.  Musculoskeletal:     Cervical back: Neck supple.     Comments: Tender right shoulder with decreased range of motion  Lymphadenopathy:     Cervical: No cervical adenopathy.  Skin:    Findings: No erythema or rash.  Neurological:     Mental Status: He is alert and oriented to person, place, and time.     Motor: No abnormal muscle tone.     Coordination: Coordination normal.  Psychiatric:        Behavior: Behavior normal.    ED Results / Procedures / Treatments   Labs (all labs ordered are listed, but only abnormal results are displayed) Labs Reviewed - No data to display  EKG None  Radiology DG Shoulder Right  Result Date: 09/25/2021 CLINICAL DATA:  Chronic shoulder pain over the last several months. EXAM: RIGHT SHOULDER - 2+ VIEW COMPARISON:  10/13/2018.  MRI 09/17/2021. FINDINGS: Bone humeral joint appears normal. Humeral acromial distance within normal limits. 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 No acute radiographic finding. IMPRESSION: No acute radiographic finding. No evidence of fracture or significant degenerative change by radiography. Chronic proximal humeral medullary bone infarction. Large rotator cuff tear known to be present by recent MRI. Electronically Signed   By: Nelson Chimes M.D.   On: 09/25/2021 10:32    Procedures Procedures    Medications Ordered in ED Medications  oxyCODONE-acetaminophen (PERCOCET/ROXICET) 5-325 MG per tablet 1 tablet (1  tablet Oral Given 09/25/21 1028)  diphenhydrAMINE (BENADRYL) capsule 25 mg (25 mg Oral Given 09/25/21 1146)    ED Course/ Medical Decision Making/ A&P                           Medical Decision Making Amount and/or Complexity of Data Reviewed Radiology: ordered.  Risk Prescription drug management.  This patient presents to the ED for concern of right shoulder pain, this involves an extensive number of treatment options, and is a complaint that carries with it a high risk of complications and morbidity.  The differential diagnosis includes musculoskeletal injury   Co morbidities that complicate the patient evaluation  Hypertension and renal failure with chronic shoulder pain  Additional history obtained:  Additional history obtained from patient External records from outside source obtained and reviewed including hospital record   Lab Tests:  No labs Imaging Studies ordered:  I ordered imaging studies including right shoulder I independently visualized and interpreted imaging which showed no acute findings I agree with the radiologist interpretation   Cardiac Monitoring: / EKG:  The patient was maintained on a cardiac monitor.  I personally viewed and interpreted the cardiac monitored which showed an underlying rhythm of: Normal sinus rhythm   Consultations Obtained:  No consultant  Problem List / ED Course / Critical interventions / Medication management  Shoulder pain I ordered medication including Percocet for pain Reevaluation of the patient after these medicines showed that the patient improved I have reviewed the patients home medicines and have made adjustments as needed   Social Determinants of Health:  None   Test / Admission - Considered:  Other test considered is an MRI of the shoulder which can be done as an outpatient.  He does not meet inpatient criteria  Patient with exacerbation of chronic right shoulder discomfort.  And history of rotator  cuff problem.  He is given Percocet and will follow-up with Ortho        Final Clinical Impression(s) / ED Diagnoses Final diagnoses:  Acute pain of right shoulder    Rx / DC Orders ED Discharge Orders          Ordered    oxyCODONE-acetaminophen (PERCOCET) 5-325 MG tablet  Every 6 hours PRN        09/25/21 1419              Milton Ferguson, MD 09/29/21 [phone]

## 2021-09-26 ENCOUNTER — Ambulatory Visit (INDEPENDENT_AMBULATORY_CARE_PROVIDER_SITE_OTHER): Payer: Medicare Other | Admitting: Physician Assistant

## 2021-09-26 ENCOUNTER — Other Ambulatory Visit: Payer: Self-pay

## 2021-09-26 ENCOUNTER — Encounter: Payer: Self-pay | Admitting: Physician Assistant

## 2021-09-26 ENCOUNTER — Telehealth (INDEPENDENT_AMBULATORY_CARE_PROVIDER_SITE_OTHER): Payer: Self-pay | Admitting: Primary Care

## 2021-09-26 ENCOUNTER — Inpatient Hospital Stay: Payer: Medicare Other | Attending: Internal Medicine

## 2021-09-26 ENCOUNTER — Other Ambulatory Visit: Payer: Self-pay | Admitting: *Deleted

## 2021-09-26 VITALS — BP 130/85 | HR 91 | Ht 71.0 in | Wt 244.6 lb

## 2021-09-26 DIAGNOSIS — K922 Gastrointestinal hemorrhage, unspecified: Secondary | ICD-10-CM | POA: Insufficient documentation

## 2021-09-26 DIAGNOSIS — I10 Essential (primary) hypertension: Secondary | ICD-10-CM | POA: Diagnosis not present

## 2021-09-26 DIAGNOSIS — Z992 Dependence on renal dialysis: Secondary | ICD-10-CM | POA: Insufficient documentation

## 2021-09-26 DIAGNOSIS — D500 Iron deficiency anemia secondary to blood loss (chronic): Secondary | ICD-10-CM | POA: Insufficient documentation

## 2021-09-26 DIAGNOSIS — C221 Intrahepatic bile duct carcinoma: Secondary | ICD-10-CM | POA: Diagnosis present

## 2021-09-26 DIAGNOSIS — R072 Precordial pain: Secondary | ICD-10-CM | POA: Diagnosis not present

## 2021-09-26 DIAGNOSIS — K921 Melena: Secondary | ICD-10-CM

## 2021-09-26 DIAGNOSIS — K31819 Angiodysplasia of stomach and duodenum without bleeding: Secondary | ICD-10-CM

## 2021-09-26 DIAGNOSIS — D509 Iron deficiency anemia, unspecified: Secondary | ICD-10-CM | POA: Diagnosis not present

## 2021-09-26 DIAGNOSIS — C220 Liver cell carcinoma: Secondary | ICD-10-CM

## 2021-09-26 DIAGNOSIS — D649 Anemia, unspecified: Secondary | ICD-10-CM

## 2021-09-26 DIAGNOSIS — K7460 Unspecified cirrhosis of liver: Secondary | ICD-10-CM | POA: Insufficient documentation

## 2021-09-26 DIAGNOSIS — Z720 Tobacco use: Secondary | ICD-10-CM

## 2021-09-26 DIAGNOSIS — D631 Anemia in chronic kidney disease: Secondary | ICD-10-CM

## 2021-09-26 DIAGNOSIS — N186 End stage renal disease: Secondary | ICD-10-CM | POA: Insufficient documentation

## 2021-09-26 LAB — CMP (CANCER CENTER ONLY)
ALT: 11 U/L (ref 0–44)
AST: 34 U/L (ref 15–41)
Albumin: 3 g/dL — ABNORMAL LOW (ref 3.5–5.0)
Alkaline Phosphatase: 118 U/L (ref 38–126)
Anion gap: 15 (ref 5–15)
BUN: 45 mg/dL — ABNORMAL HIGH (ref 8–23)
CO2: 25 mmol/L (ref 22–32)
Calcium: 9.2 mg/dL (ref 8.9–10.3)
Chloride: 97 mmol/L — ABNORMAL LOW (ref 98–111)
Creatinine: 7.78 mg/dL (ref 0.61–1.24)
GFR, Estimated: 7 mL/min — ABNORMAL LOW (ref >60)
Glucose, Bld: 153 mg/dL — ABNORMAL HIGH (ref 70–99)
Potassium: 4.3 mmol/L (ref 3.5–5.1)
Sodium: 137 mmol/L (ref 135–145)
Total Bilirubin: 0.6 mg/dL (ref 0.3–1.2)
Total Protein: 7.6 g/dL (ref 6.5–8.1)

## 2021-09-26 LAB — CBC WITH DIFFERENTIAL/PLATELET
Abs Immature Granulocytes: 0.01 10*3/uL (ref 0.00–0.07)
Basophils Absolute: 0 10*3/uL (ref 0.0–0.1)
Basophils Relative: 0 %
Eosinophils Absolute: 0.1 10*3/uL (ref 0.0–0.5)
Eosinophils Relative: 3 %
HCT: 23 % — ABNORMAL LOW (ref 39.0–52.0)
Hemoglobin: 7.8 g/dL — ABNORMAL LOW (ref 13.0–17.0)
Immature Granulocytes: 0 %
Lymphocytes Relative: 24 %
Lymphs Abs: 1.1 10*3/uL (ref 0.7–4.0)
MCH: 33.8 pg (ref 26.0–34.0)
MCHC: 33.9 g/dL (ref 30.0–36.0)
MCV: 99.6 fL (ref 80.0–100.0)
Monocytes Absolute: 0.5 10*3/uL (ref 0.1–1.0)
Monocytes Relative: 12 %
Neutro Abs: 2.8 10*3/uL (ref 1.7–7.7)
Neutrophils Relative %: 61 %
Platelets: 125 10*3/uL — ABNORMAL LOW (ref 150–400)
RBC: 2.31 MIL/uL — ABNORMAL LOW (ref 4.22–5.81)
RDW: 18.9 % — ABNORMAL HIGH (ref 11.5–15.5)
WBC: 4.5 10*3/uL (ref 4.0–10.5)
nRBC: 0 % (ref 0.0–0.2)

## 2021-09-26 LAB — SAMPLE TO BLOOD BANK

## 2021-09-26 LAB — PREPARE RBC (CROSSMATCH)

## 2021-09-26 LAB — FERRITIN: Ferritin: 205 ng/mL (ref 24–336)

## 2021-09-26 MED ORDER — METOPROLOL TARTRATE 100 MG PO TABS: 100.0000 mg | ORAL_TABLET | Freq: Two times a day (BID) | ORAL | 0 refills | Status: DC

## 2021-09-26 MED ORDER — IVABRADINE HCL 5 MG PO TABS: 5.0000 mg | ORAL_TABLET | Freq: Two times a day (BID) | ORAL | 0 refills | Status: DC

## 2021-09-26 NOTE — Telephone Encounter (Signed)
FYI

## 2021-09-26 NOTE — Progress Notes (Signed)
Blood orders entered T&S and Prepare order.  Spoke with Lattie Haw in blood bank to confirm orders.  Lattie Haw confirmed both orders.

## 2021-09-26 NOTE — Patient Instructions (Addendum)
Medication Instructions:  No Changes *If you need a refill on your cardiac medications before your next appointment, please call your pharmacy*   Lab Work: No Labs If you have labs (blood work) drawn today and your tests are completely normal, you will receive your results only by: [place] (if you have MyChart) OR A paper copy in the mail If you have any lab test that is abnormal or we need to change your treatment, we will call you to review the results.   Testing/Procedures:   Your cardiac CT will be scheduled at one of the below locations:   [facility] [address] [place], [place] [address] [phone]   If scheduled at [facility], please arrive at the [facility] and Children's Entrance (Entrance C2) of [facility] 30 minutes prior to test start time. You can use the FREE valet parking offered at entrance C (encouraged to control the heart rate for the test)  Proceed to the [facility] Radiology Department (first floor) to check-in and test prep.  All radiology patients and guests should use entrance C2 at [facility], accessed from [facility], even though the hospital's physical address listed is [address].    If scheduled at [facility], please arrive 15 mins early for check-in and test prep.  Please follow these instructions carefully (unless otherwise directed):  Hold all erectile dysfunction medications at least 3 days (72 hrs) prior to test.  On the Night Before the Test: Be sure to Drink plenty of water. Do not consume any caffeinated/decaffeinated beverages or chocolate 12 hours prior to your test. Do not take any antihistamines 12 hours prior to your test. If the patient has contrast allergy: Patient will need a prescription for Prednisone and very clear instructions (as follows): Prednisone 50 mg - take 13 hours prior to test Take another Prednisone 50 mg 7  hours prior to test Take another Prednisone 50 mg 1 hour prior to test Take Benadryl 50 mg 1 hour prior to test Patient must complete all four doses of above prophylactic medications. Patient will need a ride after test due to Benadryl.  On the Day of the Test: Drink plenty of water until 1 hour prior to the test. Do not eat any food 4 hours prior to the test. You may take your regular medications prior to the test.  Take metoprolol (Lopressor) two hours prior to test. HOLD Furosemide/Hydrochlorothiazide morning of the test. FEMALES- please wear underwire-free bra if available, avoid dresses & tight clothing   *For Clinical Staff only. Please instruct patient the following:* Heart Rate Medication Recommendations for Cardiac CT  Resting HR < 50 bpm  No medication  Resting HR 50-60 bpm and BP >110/50 mmHG   Consider Metoprolol tartrate 25 mg PO 90-120 min prior to scan  Resting HR 60-65 bpm and BP >110/50 mmHG  Metoprolol tartrate 50 mg PO 90-120 minutes prior to scan   Resting HR > 65 bpm and BP >110/50 mmHG  Metoprolol tartrate 100 mg PO 90-120 minutes prior to scan  Consider Ivabradine 10-15 mg PO or a calcium channel blocker for resting HR >60 bpm and contraindication to metoprolol tartrate  Consider Ivabradine 10-15 mg PO in combination with metoprolol tartrate for HR >80 bpm         After the Test: Drink plenty of water. After receiving IV contrast, you may experience a mild flushed feeling. This is normal. On occasion, you may  experience a mild rash up to 24 hours after the test. This is not dangerous. If this occurs, you can take Benadryl 25 mg and increase your fluid intake. If you experience trouble breathing, this can be serious. If it is severe call 911 IMMEDIATELY. If it is mild, please call our office. If you take any of these medications: Glipizide/Metformin, Avandament, Glucavance, please do not take 48 hours after completing test unless otherwise instructed.  We  will call to schedule your test 2-4 weeks out understanding that some insurance companies will need an authorization prior to the service being performed.   For non-scheduling related questions, please contact the cardiac imaging nurse navigator should you have any questions/concerns: Marchia Bond, Cardiac Imaging Nurse Navigator Gordy Clement, Cardiac Imaging Nurse Navigator [facility] Direct Office Dial: [phone]   For scheduling needs, including cancellations and rescheduling, please call [place], [phone].    Follow-Up: At [facility], you and your health needs are our priority.  As part of our continuing mission to provide you with exceptional heart care, we have created designated Provider Care Teams.  These Care Teams include your primary Cardiologist (physician) and Advanced Practice Providers (APPs -  Physician Assistants and Nurse Practitioners) who all work together to provide you with the care you need, when you need it.  We recommend signing up for the patient portal called "MyChart".  Sign up information is provided on this After Visit Summary.  MyChart is used to connect with patients for Virtual Visits (Telemedicine).  Patients are able to view lab/test results, encounter notes, upcoming appointments, etc.  Non-urgent messages can be sent to your provider as well.   To learn more about what you can do with MyChart, go to [email or web address].    Your next appointment:   2 month(s)  The format for your next appointment:   In Person  Provider:   Caron Presume, PA-C    Then, Quay Burow, MD will plan to see you again in 6 month(s).

## 2021-09-26 NOTE — Patient Outreach (Signed)
[facility]) Care Management  09/26/2021  AMAIR SHROUT [birth year] [phone]  Telephone outreach for transition of care. No answer, unable to leave a voicemail, but did leave SMS# for call back.  Note pt went to the ED yesterday for shoulder pain. He was treated and released.  [name], MSN, [facility] Gerontological Nurse Practitioner [facility] Care Management [phone]

## 2021-09-26 NOTE — Telephone Encounter (Signed)
Angel from [facility] called to report that the patient is discharging from home health services. Angel scheduled to see patient between 10am-11am and arrived at 10:25 am and the patient was not there. She was going to eval for more visits.   Best contact: [phone]

## 2021-09-28 ENCOUNTER — Observation Stay (HOSPITAL_COMMUNITY): Payer: Medicare Other

## 2021-09-28 ENCOUNTER — Observation Stay (HOSPITAL_BASED_OUTPATIENT_CLINIC_OR_DEPARTMENT_OTHER): Payer: Medicare Other

## 2021-09-28 ENCOUNTER — Inpatient Hospital Stay: Payer: Medicare Other

## 2021-09-28 ENCOUNTER — Observation Stay (HOSPITAL_COMMUNITY)
Admission: EM | Admit: 2021-09-28 | Discharge: 2021-09-29 | Disposition: A | Discharge status: Home / Self Care | Payer: Medicare Other | Attending: Internal Medicine | Admitting: Internal Medicine

## 2021-09-28 ENCOUNTER — Encounter (HOSPITAL_COMMUNITY): Payer: Self-pay | Admitting: *Deleted

## 2021-09-28 ENCOUNTER — Emergency Department (HOSPITAL_COMMUNITY): Payer: Medicare Other

## 2021-09-28 ENCOUNTER — Other Ambulatory Visit: Payer: Self-pay

## 2021-09-28 DIAGNOSIS — J189 Pneumonia, unspecified organism: Secondary | ICD-10-CM | POA: Diagnosis not present

## 2021-09-28 DIAGNOSIS — Y792 Prosthetic and other implants, materials and accessory orthopedic devices associated with adverse incidents: Secondary | ICD-10-CM | POA: Diagnosis not present

## 2021-09-28 DIAGNOSIS — M79605 Pain in left leg: Secondary | ICD-10-CM | POA: Diagnosis not present

## 2021-09-28 DIAGNOSIS — Z8505 Personal history of malignant neoplasm of liver: Secondary | ICD-10-CM | POA: Diagnosis not present

## 2021-09-28 DIAGNOSIS — F17210 Nicotine dependence, cigarettes, uncomplicated: Secondary | ICD-10-CM | POA: Insufficient documentation

## 2021-09-28 DIAGNOSIS — Z720 Tobacco use: Secondary | ICD-10-CM | POA: Diagnosis present

## 2021-09-28 DIAGNOSIS — Z7982 Long term (current) use of aspirin: Secondary | ICD-10-CM | POA: Insufficient documentation

## 2021-09-28 DIAGNOSIS — C220 Liver cell carcinoma: Secondary | ICD-10-CM | POA: Diagnosis present

## 2021-09-28 DIAGNOSIS — E119 Type 2 diabetes mellitus without complications: Secondary | ICD-10-CM

## 2021-09-28 DIAGNOSIS — T8789 Other complications of amputation stump: Secondary | ICD-10-CM | POA: Diagnosis not present

## 2021-09-28 DIAGNOSIS — I739 Peripheral vascular disease, unspecified: Secondary | ICD-10-CM | POA: Diagnosis present

## 2021-09-28 DIAGNOSIS — Z89512 Acquired absence of left leg below knee: Secondary | ICD-10-CM | POA: Insufficient documentation

## 2021-09-28 DIAGNOSIS — D649 Anemia, unspecified: Secondary | ICD-10-CM | POA: Diagnosis not present

## 2021-09-28 DIAGNOSIS — I120 Hypertensive chronic kidney disease with stage 5 chronic kidney disease or end stage renal disease: Secondary | ICD-10-CM | POA: Insufficient documentation

## 2021-09-28 DIAGNOSIS — I10 Essential (primary) hypertension: Secondary | ICD-10-CM | POA: Diagnosis present

## 2021-09-28 DIAGNOSIS — Z992 Dependence on renal dialysis: Secondary | ICD-10-CM | POA: Diagnosis not present

## 2021-09-28 DIAGNOSIS — D500 Iron deficiency anemia secondary to blood loss (chronic): Secondary | ICD-10-CM | POA: Diagnosis present

## 2021-09-28 DIAGNOSIS — Z79899 Other long term (current) drug therapy: Secondary | ICD-10-CM | POA: Diagnosis not present

## 2021-09-28 DIAGNOSIS — E1122 Type 2 diabetes mellitus with diabetic chronic kidney disease: Secondary | ICD-10-CM | POA: Insufficient documentation

## 2021-09-28 DIAGNOSIS — N186 End stage renal disease: Secondary | ICD-10-CM | POA: Insufficient documentation

## 2021-09-28 DIAGNOSIS — F17200 Nicotine dependence, unspecified, uncomplicated: Secondary | ICD-10-CM | POA: Diagnosis present

## 2021-09-28 DIAGNOSIS — K922 Gastrointestinal hemorrhage, unspecified: Secondary | ICD-10-CM | POA: Diagnosis present

## 2021-09-28 DIAGNOSIS — G894 Chronic pain syndrome: Secondary | ICD-10-CM | POA: Diagnosis present

## 2021-09-28 DIAGNOSIS — M9689 Other intraoperative and postprocedural complications and disorders of the musculoskeletal system: Secondary | ICD-10-CM | POA: Diagnosis not present

## 2021-09-28 LAB — CBC
HCT: 20.8 % — ABNORMAL LOW (ref 39.0–52.0)
HCT: 29 % — ABNORMAL LOW (ref 39.0–52.0)
Hemoglobin: 6.9 g/dL — CL (ref 13.0–17.0)
Hemoglobin: 10.1 g/dL — ABNORMAL LOW (ref 13.0–17.0)
MCH: 33.5 pg (ref 26.0–34.0)
MCH: 32.8 pg (ref 26.0–34.0)
MCHC: 33.2 g/dL (ref 30.0–36.0)
MCHC: 34.8 g/dL (ref 30.0–36.0)
MCV: 101 fL — ABNORMAL HIGH (ref 80.0–100.0)
MCV: 94.2 fL (ref 80.0–100.0)
Platelets: 153 10*3/uL (ref 150–400)
Platelets: 130 10*3/uL — ABNORMAL LOW (ref 150–400)
RBC: 2.06 MIL/uL — ABNORMAL LOW (ref 4.22–5.81)
RBC: 3.08 MIL/uL — ABNORMAL LOW (ref 4.22–5.81)
RDW: 19 % — ABNORMAL HIGH (ref 11.5–15.5)
RDW: 20.3 % — ABNORMAL HIGH (ref 11.5–15.5)
WBC: 4.8 10*3/uL (ref 4.0–10.5)
WBC: 5.6 10*3/uL (ref 4.0–10.5)
nRBC: 0 % (ref 0.0–0.2)
nRBC: 0 % (ref 0.0–0.2)

## 2021-09-28 LAB — BASIC METABOLIC PANEL
Anion gap: 17 — ABNORMAL HIGH (ref 5–15)
BUN: 36 mg/dL — ABNORMAL HIGH (ref 8–23)
CO2: 24 mmol/L (ref 22–32)
Calcium: 8.9 mg/dL (ref 8.9–10.3)
Chloride: 93 mmol/L — ABNORMAL LOW (ref 98–111)
Creatinine, Ser: 6.46 mg/dL — ABNORMAL HIGH (ref 0.61–1.24)
GFR, Estimated: 9 mL/min — ABNORMAL LOW (ref >60)
Glucose, Bld: 87 mg/dL (ref 70–99)
Potassium: 5 mmol/L (ref 3.5–5.1)
Sodium: 134 mmol/L — ABNORMAL LOW (ref 135–145)

## 2021-09-28 LAB — PROTIME-INR
INR: 1 (ref 0.8–1.2)
Prothrombin Time: 13.4 seconds (ref 11.4–15.2)

## 2021-09-28 LAB — POC OCCULT BLOOD, ED: Fecal Occult Bld: NEGATIVE

## 2021-09-28 LAB — COMPREHENSIVE METABOLIC PANEL
ALT: 16 U/L (ref 0–44)
AST: 43 U/L — ABNORMAL HIGH (ref 15–41)
Albumin: 2.5 g/dL — ABNORMAL LOW (ref 3.5–5.0)
Alkaline Phosphatase: 126 U/L (ref 38–126)
Anion gap: 16 — ABNORMAL HIGH (ref 5–15)
BUN: 28 mg/dL — ABNORMAL HIGH (ref 8–23)
CO2: 26 mmol/L (ref 22–32)
Calcium: 9.1 mg/dL (ref 8.9–10.3)
Chloride: 98 mmol/L (ref 98–111)
Creatinine, Ser: 5.88 mg/dL — ABNORMAL HIGH (ref 0.61–1.24)
GFR, Estimated: 10 mL/min — ABNORMAL LOW (ref >60)
Glucose, Bld: 132 mg/dL — ABNORMAL HIGH (ref 70–99)
Potassium: 4.6 mmol/L (ref 3.5–5.1)
Sodium: 140 mmol/L (ref 135–145)
Total Bilirubin: 0.7 mg/dL (ref 0.3–1.2)
Total Protein: 8.4 g/dL — ABNORMAL HIGH (ref 6.5–8.1)

## 2021-09-28 LAB — MAGNESIUM: Magnesium: 1.9 mg/dL (ref 1.7–2.4)

## 2021-09-28 LAB — PREPARE RBC (CROSSMATCH)

## 2021-09-28 MED ORDER — OXYCODONE-ACETAMINOPHEN 5-325 MG PO TABS
1.0000 | ORAL_TABLET | Freq: Four times a day (QID) | ORAL | Status: DC | PRN
  Administered 2021-09-28 – 2021-09-29 (×3): 1 via ORAL
  Filled 2021-09-28 (×3): qty 1

## 2021-09-28 MED ORDER — METHOCARBAMOL 500 MG PO TABS
500.0000 mg | ORAL_TABLET | Freq: Four times a day (QID) | ORAL | Status: DC | PRN
Start: 2021-09-28 — End: 2021-09-29
  Administered 2021-09-28 – 2021-09-29 (×2): 500 mg via ORAL
  Filled 2021-09-28 (×2): qty 1

## 2021-09-28 MED ORDER — AMLODIPINE BESYLATE 5 MG PO TABS
5.0000 mg | ORAL_TABLET | Freq: Every day | ORAL | Status: DC
  Administered 2021-09-28 – 2021-09-29 (×2): 5 mg via ORAL
  Filled 2021-09-28 (×2): qty 1

## 2021-09-28 MED ORDER — PANTOPRAZOLE SODIUM 40 MG PO TBEC
40.0000 mg | DELAYED_RELEASE_TABLET | Freq: Two times a day (BID) | ORAL | Status: DC
  Administered 2021-09-29: 40 mg via ORAL
  Filled 2021-09-28: qty 1

## 2021-09-28 MED ORDER — TRAZODONE HCL 50 MG PO TABS
50.0000 mg | ORAL_TABLET | Freq: Every evening | ORAL | Status: DC | PRN
  Administered 2021-09-28: 50 mg via ORAL
  Filled 2021-09-28: qty 1

## 2021-09-28 MED ORDER — AMLODIPINE BESYLATE 5 MG PO TABS: 5.0000 mg | ORAL_TABLET | Freq: Every day | ORAL | Status: DC

## 2021-09-28 MED ORDER — HYDROXYZINE HCL 25 MG PO TABS: 25.0000 mg | ORAL_TABLET | Freq: Three times a day (TID) | ORAL | Status: DC | PRN

## 2021-09-28 MED ORDER — CAMPHOR-MENTHOL 0.5-0.5 % EX LOTN
1.0000 "application " | TOPICAL_LOTION | Freq: Three times a day (TID) | CUTANEOUS | Status: DC | PRN
  Filled 2021-09-28: qty 222

## 2021-09-28 MED ORDER — IVABRADINE HCL 5 MG PO TABS: 5.0000 mg | ORAL_TABLET | Freq: Two times a day (BID) | ORAL | Status: DC

## 2021-09-28 MED ORDER — CALCIUM ACETATE (PHOS BINDER) 667 MG/5ML PO SOLN
667.0000 mg | Freq: Three times a day (TID) | ORAL | Status: DC
Start: 2021-09-28 — End: 2021-09-28

## 2021-09-28 MED ORDER — ONDANSETRON HCL 4 MG PO TABS: 4.0000 mg | ORAL_TABLET | Freq: Four times a day (QID) | ORAL | Status: DC | PRN

## 2021-09-28 MED ORDER — CALCIUM CARBONATE ANTACID 1250 MG/5ML PO SUSP: 500.0000 mg | Freq: Four times a day (QID) | ORAL | Status: DC | PRN

## 2021-09-28 MED ORDER — CALCIUM ACETATE (PHOS BINDER) 667 MG/5ML PO SOLN
667.0000 mg | Freq: Three times a day (TID) | ORAL | Status: DC
  Filled 2021-09-28 (×4): qty 5

## 2021-09-28 MED ORDER — ACETAMINOPHEN 650 MG RE SUPP: 650.0000 mg | Freq: Four times a day (QID) | RECTAL | Status: DC | PRN

## 2021-09-28 MED ORDER — ATORVASTATIN CALCIUM 40 MG PO TABS: 40.0000 mg | ORAL_TABLET | Freq: Every day | ORAL | Status: DC

## 2021-09-28 MED ORDER — NICOTINE 14 MG/24HR TD PT24
14.0000 mg | MEDICATED_PATCH | Freq: Every day | TRANSDERMAL | Status: DC
  Administered 2021-09-28 – 2021-09-29 (×2): 14 mg via TRANSDERMAL
  Filled 2021-09-28 (×2): qty 1

## 2021-09-28 MED ORDER — SODIUM CHLORIDE 0.9% FLUSH
3.0000 mL | Freq: Two times a day (BID) | INTRAVENOUS | Status: DC
  Administered 2021-09-28 – 2021-09-29 (×3): 3 mL via INTRAVENOUS

## 2021-09-28 MED ORDER — ONDANSETRON HCL 4 MG/2ML IJ SOLN: 4.0000 mg | Freq: Four times a day (QID) | INTRAMUSCULAR | Status: DC | PRN

## 2021-09-28 MED ORDER — ACETAMINOPHEN 325 MG PO TABS: 650.0000 mg | ORAL_TABLET | Freq: Four times a day (QID) | ORAL | Status: DC | PRN

## 2021-09-28 MED ORDER — SORBITOL 70 % SOLN
30.0000 mL | Status: DC | PRN
  Filled 2021-09-28: qty 30

## 2021-09-28 MED ORDER — METOPROLOL TARTRATE 25 MG PO TABS
100.0000 mg | ORAL_TABLET | Freq: Two times a day (BID) | ORAL | Status: DC
Start: 2021-09-28 — End: 2021-09-28

## 2021-09-28 MED ORDER — HYDRALAZINE HCL 20 MG/ML IJ SOLN
5.0000 mg | INTRAMUSCULAR | Status: DC | PRN
  Administered 2021-09-28 – 2021-09-29 (×2): 5 mg via INTRAVENOUS
  Filled 2021-09-28 (×2): qty 1

## 2021-09-28 MED ORDER — IOHEXOL 350 MG/ML SOLN
100.0000 mL | Freq: Once | INTRAVENOUS | Status: AC | PRN
  Administered 2021-09-28: 100 mL via INTRAVENOUS

## 2021-09-28 MED ORDER — SODIUM CHLORIDE 0.9 % IV SOLN
10.0000 mL/h | Freq: Once | INTRAVENOUS | Status: AC
  Administered 2021-09-28: 10 mL/h via INTRAVENOUS

## 2021-09-28 MED ORDER — ATORVASTATIN CALCIUM 40 MG PO TABS
40.0000 mg | ORAL_TABLET | Freq: Every day | ORAL | Status: DC
  Administered 2021-09-28 – 2021-09-29 (×2): 40 mg via ORAL
  Filled 2021-09-28 (×2): qty 1

## 2021-09-28 MED ORDER — DOCUSATE SODIUM 283 MG RE ENEM
1.0000 | ENEMA | RECTAL | Status: DC | PRN
  Filled 2021-09-28: qty 1

## 2021-09-28 MED ORDER — ZOLPIDEM TARTRATE 5 MG PO TABS
5.0000 mg | ORAL_TABLET | Freq: Every evening | ORAL | Status: DC | PRN
  Administered 2021-09-28: 5 mg via ORAL
  Filled 2021-09-28: qty 1

## 2021-09-28 MED ORDER — GABAPENTIN 100 MG PO CAPS: 100.0000 mg | ORAL_CAPSULE | Freq: Three times a day (TID) | ORAL | Status: DC

## 2021-09-28 MED ORDER — GABAPENTIN 100 MG PO CAPS
100.0000 mg | ORAL_CAPSULE | Freq: Three times a day (TID) | ORAL | Status: DC
  Administered 2021-09-28 – 2021-09-29 (×3): 100 mg via ORAL
  Filled 2021-09-28 (×3): qty 1

## 2021-09-28 MED ORDER — NEPRO/CARBSTEADY PO LIQD
237.0000 mL | Freq: Three times a day (TID) | ORAL | Status: DC | PRN
  Filled 2021-09-28: qty 237

## 2021-09-28 NOTE — ED Notes (Signed)
Patient requesting another cup of ice. Patient has had 3 cups of ice at this time, educated patient on the importance of adhering to fluid restrictions while receiving blood products

## 2021-09-28 NOTE — Plan of Care (Signed)

## 2021-09-28 NOTE — H&P (Signed)
History and Physical    Patient: Daniel [name] HUD:[id number] DOB: 1960/08/18 DOA: 09/28/2021 DOS: the patient was seen and examined on 09/28/2021 PCP: [name], NP  Patient coming from:  Home - lives in a boarding house; NOK: Lucille Passy, [phone]   Chief Complaint: Fabienne Bruns bleeding  HPI: Daniel [name] is a 61 y.o. 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  He was last admitted from 5/20-24 with GI bleeding from AVM and esophageal varices, treated with APC.  He was transfused 3 units PRBC and Hgb was 8.4 at the time of discharge.  He was anticipated to need intermittent blood and iron transfusions as an outpatient.   He returned today with recurrent shoulder pain and dark stools since yesterday.  He reports primarily B shoulder pain and concern about intermittent edema of his LLE stump.  He did have dark stools recently but does not complain about this currently.    ER Course:  HD Friday, no difficulty.  Here recently for GI bleeding, appears to have recurrence.  Oncology noted drifting down, told to come for transfusion.  Hgb 6.9, heme negative.  Will consult GI.  May leave AMA.     Review of Systems: As mentioned in the history of present illness. All other systems reviewed and are negative. Past Medical History:  Diagnosis Date   Anemia    Diabetes mellitus without complication [facility])    patient denies   Dialysis patient [facility])    End stage chronic kidney disease ([place])    Hypertension    ICH (intracerebral hemorrhage) ([place]) 05/20/2017   PAD (peripheral artery disease) ([facility])    Shoulder pain, left 06/28/2013   Past Surgical History:  Procedure Laterality Date   A/V FISTULAGRAM N/A 08/15/2020   Procedure: A/V FISTULAGRAM - Left Upper;   Surgeon: Cherre Robins, MD;  Location: [place] [facility];  Service: Cardiovascular;  Laterality: N/A;   ABDOMINAL AORTOGRAM W/LOWER EXTREMITY N/A 04/08/2021   Procedure: ABDOMINAL AORTOGRAM W/LOWER EXTREMITY;  Surgeon: Waynetta Sandy, MD;  Location: [place] [facility];  Service: Cardiovascular;  Laterality: N/A;   AMPUTATION Left 04/16/2021   Procedure: LEFT BELOW KNEE AMPUTATION;  Surgeon: Angelia Mould, MD;  Location: [place];  Service: Vascular;  Laterality: Left;   AMPUTATION Left 06/17/2021   Procedure: REVISION AMPUTATION BELOW KNEE;  Surgeon: Broadus John, MD;  Location: [place];  Service: Vascular;  Laterality: Left;   APPENDECTOMY     APPLICATION OF WOUND VAC Left 06/17/2021   Procedure: APPLICATION OF WOUND VAC;  Surgeon: Broadus John, MD;  Location: [place];  Service: Vascular;  Laterality: Left;   AV FISTULA PLACEMENT Left 08/03/2019   Procedure: LEFT ARM ARTERIOVENOUS (AV) CEPHALIC  FISTULA CREATION;  Surgeon: Waynetta Sandy, MD;  Location: [place];  Service: Vascular;  Laterality: Left;   BIOPSY  06/30/2019   Procedure: BIOPSY;  Surgeon: Ronald Lobo, MD;  Location: [facility] ENDOSCOPY;  Service: Endoscopy;;   BIOPSY  08/02/2019   Procedure: BIOPSY;  Surgeon: Yetta Flock, MD;  Location: [facility] ENDOSCOPY;  Service: Gastroenterology;;   BIOPSY  02/08/2021   Procedure: BIOPSY;  Surgeon: Sharyn Creamer, MD;  Location: [place];  Service: Gastroenterology;;   BIOPSY  02/24/2021   Procedure: BIOPSY;  Surgeon:  [name], MD;  Location: [facility] ENDOSCOPY;  Service: Gastroenterology;;   BIOPSY  03/26/2021   Procedure: BIOPSY;  Surgeon: Lavena Bullion, DO;  Location: [place];  Service: Gastroenterology;;   BIOPSY  08/06/2021   Procedure: BIOPSY;  Surgeon: [name], MD;  Location: [place];  Service: Gastroenterology;;   BIOPSY  09/15/2021   Procedure: BIOPSY;  Surgeon: Sharyn Creamer, MD;  Location: [place];  Service:  Gastroenterology;;   COLONOSCOPY  01/23/2012   Procedure: COLONOSCOPY;  Surgeon: Danie Binder, MD;  [facility];  Service: Endoscopy;  Laterality: N/A;  11:10 AM   COLONOSCOPY WITH PROPOFOL N/A 06/30/2019   Procedure: COLONOSCOPY WITH PROPOFOL;  Surgeon: Ronald Lobo, MD;  Location: [place];  Service: Endoscopy;  Laterality: N/A;   ENTEROSCOPY N/A 08/02/2019   Procedure: ENTEROSCOPY;  Surgeon: Yetta Flock, MD;  Location: [facility] ENDOSCOPY;  Service: Gastroenterology;  Laterality: N/A;   ENTEROSCOPY N/A 02/24/2021   Procedure: ENTEROSCOPY;  Surgeon: [name], MD;  Location: [facility] ENDOSCOPY;  Service: Gastroenterology;  Laterality: N/A;   ENTEROSCOPY N/A 03/26/2021   Procedure: ENTEROSCOPY;  Surgeon: Lavena Bullion, DO;  Location: [place];  Service: Gastroenterology;  Laterality: N/A;   ENTEROSCOPY N/A 07/05/2021   Procedure: ENTEROSCOPY;  Surgeon: Carol Ada, MD;  Location: [place];  Service: Gastroenterology;  Laterality: N/A;   ENTEROSCOPY N/A 08/06/2021   Procedure: ENTEROSCOPY;  Surgeon: [name], MD;  Location: [facility] ENDOSCOPY;  Service: Gastroenterology;  Laterality: N/A;   ENTEROSCOPY N/A 08/24/2021   Procedure: ENTEROSCOPY;  Surgeon: Ladene Artist, MD;  Location: [facility] ENDOSCOPY;  Service: Gastroenterology;  Laterality: N/A;   ENTEROSCOPY N/A 09/15/2021   Procedure: ENTEROSCOPY;  Surgeon: Sharyn Creamer, MD;  Location: [facility] ENDOSCOPY;  Service: Gastroenterology;  Laterality: N/A;   ESOPHAGOGASTRODUODENOSCOPY N/A 08/10/2020   Procedure: ESOPHAGOGASTRODUODENOSCOPY (EGD);  Surgeon: Jerene Bears, MD;  Location: [facility] ENDOSCOPY;  Service: Gastroenterology;  Laterality: N/A;   ESOPHAGOGASTRODUODENOSCOPY (EGD) WITH PROPOFOL N/A 06/30/2019   Procedure: ESOPHAGOGASTRODUODENOSCOPY (EGD) WITH PROPOFOL;  Surgeon: Ronald Lobo, MD;  Location: [place];  Service: Endoscopy;  Laterality: N/A;   ESOPHAGOGASTRODUODENOSCOPY (EGD) WITH PROPOFOL N/A  01/12/2021   Procedure: ESOPHAGOGASTRODUODENOSCOPY (EGD) WITH PROPOFOL;  Surgeon: Sharyn Creamer, MD;  Location: [place];  Service: Gastroenterology;  Laterality: N/A;   ESOPHAGOGASTRODUODENOSCOPY (EGD) WITH PROPOFOL N/A 02/08/2021   Procedure: ESOPHAGOGASTRODUODENOSCOPY (EGD) WITH PROPOFOL;  Surgeon: Sharyn Creamer, MD;  Location: [place];  Service: Gastroenterology;  Laterality: N/A;   FISTULA SUPERFICIALIZATION Left 10/17/2019   Procedure: LEFT UPPER EXTREMITY FISTULA REVISION, SIDE BRANCH LIGATION,  AND SUPERFICIALIZATION;  Surgeon: Marty Heck, MD;  Location: [place];  Service: Vascular;  Laterality: Left;   FISTULA SUPERFICIALIZATION Left 40/98/1191   Procedure: PLICATION OF ANEURYSM LEFT ARTERIOVENOUS FISTULA;  Surgeon: Angelia Mould, MD;  Location: [place];  Service: Vascular;  Laterality: Left;   FLEXIBLE SIGMOIDOSCOPY N/A 07/05/2021   Procedure: FLEXIBLE SIGMOIDOSCOPY;  Surgeon: Carol Ada, MD;  Location: [place];  Service: Gastroenterology;  Laterality: N/A;   GIVENS CAPSULE STUDY N/A 06/30/2019   Procedure: GIVENS CAPSULE STUDY;  Surgeon: Ronald Lobo, MD;  Location: [place];  Service: Endoscopy;  Laterality: N/A;   GIVENS CAPSULE STUDY N/A 08/25/2021   Procedure: GIVENS CAPSULE STUDY;  Surgeon: Ladene Artist, MD;  Location: [place];  Service: Gastroenterology;  Laterality: N/A;   HEMOSTASIS CLIP PLACEMENT  08/10/2020   Procedure: HEMOSTASIS CLIP PLACEMENT;  Surgeon: Jerene Bears, MD;  Location: [facility]  ENDOSCOPY;  Service: Gastroenterology;;   HEMOSTASIS CLIP PLACEMENT  01/12/2021   Procedure: HEMOSTASIS CLIP PLACEMENT;  Surgeon: Sharyn Creamer, MD;  Location: [place];  Service: Gastroenterology;;   HEMOSTASIS CONTROL  08/02/2019   Procedure: HEMOSTASIS CONTROL;  Surgeon: Yetta Flock, MD;  Location: [place];  Service: Gastroenterology;;   HOT HEMOSTASIS  02/24/2021   Procedure: HOT HEMOSTASIS (ARGON PLASMA COAGULATION/BICAP);   Surgeon: [name], MD;  Location: [facility] ENDOSCOPY;  Service: Gastroenterology;;   HOT HEMOSTASIS N/A 03/26/2021   Procedure: HOT HEMOSTASIS (ARGON PLASMA COAGULATION/BICAP);  Surgeon: Lavena Bullion, DO;  Location: [facility] ENDOSCOPY;  Service: Gastroenterology;  Laterality: N/A;   HOT HEMOSTASIS N/A 07/05/2021   Procedure: HOT HEMOSTASIS (ARGON PLASMA COAGULATION/BICAP);  Surgeon: Carol Ada, MD;  Location: [employer];  Service: Gastroenterology;  Laterality: N/A;   HOT HEMOSTASIS N/A 09/15/2021   Procedure: HOT HEMOSTASIS (ARGON PLASMA COAGULATION/BICAP);  Surgeon: Sharyn Creamer, MD;  Location: [place];  Service: Gastroenterology;  Laterality: N/A;   INCISION AND DRAINAGE ABSCESS N/A 06/29/2016   Procedure: INCISION AND DRAINAGE ABDOMINAL WALL ABSCESS;  Surgeon: Alphonsa Overall, MD;  Location: [facility];  Service: General;  Laterality: N/A;   INSERTION OF DIALYSIS CATHETER Right 08/03/2019   Procedure: INSERTION OF DIALYSIS CATHETER;  Surgeon: Waynetta Sandy, MD;  Location: [place];  Service: Vascular;  Laterality: Right;   INSERTION OF DIALYSIS CATHETER Right 10/22/2019   Procedure: INSERTION OF 23CM TUNNELED DIALYSIS CATHETER RIGHT INTERNAL JUGULAR;  Surgeon: Angelia Mould, MD;  Location: [place];  Service: Vascular;  Laterality: Right;   INSERTION OF DIALYSIS CATHETER Right 08/12/2020   Procedure: INSERTION OF Right internal Jugular TUNNELED  DIALYSIS CATHETER.;  Surgeon: Waynetta Sandy, MD;  Location: [place];  Service: Vascular;  Laterality: Right;   INSERTION OF DIALYSIS CATHETER N/A 04/19/2021   Procedure: INSERTION OF TUNNELED DIALYSIS CATHETER;  Surgeon: Angelia Mould, MD;  Location: [facility] OR;  Service: Vascular;  Laterality: N/A;   IR RADIOLOGIST EVAL & MGMT  09/12/2021   Left heel surgery     LOWER EXTREMITY ANGIOGRAPHY N/A 07/08/2021   Procedure: Lower Extremity Angiography;  Surgeon: Cherre Robins, MD;  Location: [place] [facility];  Service:  Cardiovascular;  Laterality: N/A;   PENILE BIOPSY N/A 03/26/2020   Procedure: PENILE ULCER DEBRIDEMENT;  Surgeon: Remi Haggard, MD;  Location: [facility];  Service: Urology;  Laterality: N/A;  30 MINS   PERIPHERAL VASCULAR INTERVENTION Right 07/08/2021   Procedure: PERIPHERAL VASCULAR INTERVENTION;  Surgeon: Cherre Robins, MD;  Location: [place] [facility];  Service: Cardiovascular;  Laterality: Right;   SCLEROTHERAPY  01/12/2021   Procedure: SCLEROTHERAPY;  Surgeon: Sharyn Creamer, MD;  Location: [facility] ENDOSCOPY;  Service: Gastroenterology;;   Clide Deutscher  02/24/2021   Procedure: Clide Deutscher;  Surgeon: [name], MD;  Location: [place];  Service: Gastroenterology;;   SUBMUCOSAL TATTOO INJECTION  08/24/2021   Procedure: SUBMUCOSAL TATTOO INJECTION;  Surgeon: Ladene Artist, MD;  Location: [place];  Service: Gastroenterology;;   SUBMUCOSAL TATTOO INJECTION  09/15/2021   Procedure: SUBMUCOSAL TATTOO INJECTION;  Surgeon: Sharyn Creamer, MD;  Location: [facility] ENDOSCOPY;  Service: Gastroenterology;;   Social History:  reports that he has been smoking cigarettes. He has a 22.50 pack-year smoking history. He has never used smokeless tobacco. He reports that he does not currently use alcohol. He reports that he does not currently use drugs after having used the following drugs: "Crack" cocaine.  Allergies  Allergen Reactions  Seroquel [Quetiapine] Other (See Comments)    Tardive kinesia/dystonia   Dilaudid [Hydromorphone Hcl] Itching and Other (See Comments)    Pt reports itchiness after IM injection     Family History  Problem Relation Age of Onset   Colon cancer Neg Hx     Prior to Admission medications   Medication Sig Start Date End Date Taking? Authorizing Provider  amLODipine (NORVASC) 5 MG tablet Take 1 tablet (5 mg total) by mouth daily. 09/18/21 12/17/21  [place] Donnamarie Poag, DO  aspirin EC 81 MG tablet Take 1 tablet (81 mg total) by mouth daily. 09/18/21 12/17/21   [place] Donnamarie Poag, DO  atorvastatin (LIPITOR) 40 MG tablet Take 1 tablet (40 mg total) by mouth daily. 09/18/21 12/17/21  [place] Eric J, DO  calcium acetate, Phos Binder, (PHOSLYRA) 667 MG/5ML SOLN Take 5 mLs (667 mg total) by mouth 3 (three) times daily with meals. 09/18/21 12/17/21  [place] Donnamarie Poag, DO  Darbepoetin Alfa (ARANESP) 100 MCG/0.5ML SOSY injection Inject 0.5 mLs (100 mcg total) into the vein every 7 (seven) days for 8 doses. Patient not taking: Reported on 09/15/2021 08/19/21 10/08/21  Dana Allan I, MD  gabapentin (NEURONTIN) 100 MG capsule Take 1 capsule (100 mg total) by mouth 3 (three) times daily. 09/18/21 12/17/21  [place] Donnamarie Poag, DO  ivabradine (CORLANOR) 5 MG TABS tablet Take 1 tablet (5 mg total) by mouth 2 (two) times daily with a meal. Take 2 Tablets 90-120 minutes prior to Scan. 09/26/21   Warren Lacy, PA-C  methocarbamol (ROBAXIN) 500 MG tablet Take 1 tablet (500 mg total) by mouth every 6 (six) hours as needed for muscle spasms. 08/27/21   Bonnielee Haff, MD  metoprolol tartrate (LOPRESSOR) 100 MG tablet Take 1 tablet (100 mg total) by mouth 2 (two) times daily. Take 1 Tablet 90-120 minutes prior to scan. 09/26/21 12/25/21  Warren Lacy, PA-C  oxyCODONE-acetaminophen (PERCOCET) 5-325 MG tablet Take 1 tablet by mouth every 6 (six) hours as needed. 09/25/21   Milton Ferguson, MD  pantoprazole (PROTONIX) 40 MG tablet Take 1 tablet (40 mg total) by mouth 2 (two) times daily before a meal. 09/18/21 12/17/21  [place] Donnamarie Poag, DO  traZODone (DESYREL) 50 MG tablet Take 1 tablet (50 mg total) by mouth at bedtime as needed for sleep. 09/18/21 10/18/21  [place] Eric J, DO  colchicine 0.6 MG tablet Take 0.5 tablets (0.3 mg total) by mouth 2 (two) times daily. 07/24/20 07/29/20  Noemi Chapel, MD  furosemide (LASIX) 40 MG tablet Take 1 tablet (40 mg total) by mouth daily. 07/25/19 02/09/20  Ladona Horns, MD    Physical Exam: Vitals:   09/28/21 1515 09/28/21 1517 09/28/21 1530 09/28/21  1532  BP: (!) 163/60 (!) 163/60 (!) 166/62 (!) 173/73  Pulse: 92 93 95 94  Resp: '16 16 18 16  '$ Temp:  (!) 97.5 F (36.4 C)  97.6 F (36.4 C)  TempSrc:  Axillary  Axillary  SpO2: 95%  95% 96%  Weight:      Height:       General:  Appears calm and comfortable and is in NAD Eyes:  PERRL, EOMI, normal lids, iris ENT:  grossly normal hearing, lips & tongue, mmm; poor dentition Neck:  no LAD, masses or thyromegaly Cardiovascular:  RRR, no m/r/g.  Respiratory:   CTA bilaterally with no wheezes/rales/rhonchi.  Normal respiratory effort. Abdomen:  soft, NT, ND Skin:  no rash or induration seen on limited exam Musculoskeletal:  L BKA stump with significant  edema.  Mild erythema along the healed incision. Psychiatric:  blunted  mood and affect, speech fluent and appropriate, AOx3 Neurologic:  CN 2-12 grossly intact, moves all extremities in coordinated fashion  Radiological Exams on Admission: Independently reviewed - see discussion in A/P where applicable  CT ANGIO AO+BIFEM W & OR WO CONTRAST  Result Date: 09/28/2021 CLINICAL DATA:  Peripheral arterial disease, asymptomatic. EXAM: CT ANGIOGRAPHY OF ABDOMINAL AORTA WITH ILIOFEMORAL RUNOFF TECHNIQUE: Multidetector CT imaging of the abdomen, pelvis and lower extremities was performed using the standard protocol during bolus administration of intravenous contrast. Multiplanar CT image reconstructions and MIPs were obtained to evaluate the vascular anatomy. RADIATION DOSE REDUCTION: This exam was performed according to the departmental dose-optimization program which includes automated exposure control, adjustment of the mA and/or kV according to patient size and/or use of iterative reconstruction technique. CONTRAST:  127m OMNIPAQUE IOHEXOL 350 MG/ML SOLN COMPARISON:  CTA runoff 07/03/2021 FINDINGS: VASCULAR Aorta: Diffuse atherosclerotic calcifications in the abdominal aorta without aneurysm or significant stenosis. Celiac: Patent without evidence of  aneurysm, dissection, vasculitis or significant stenosis. SMA: Patent without evidence of aneurysm, dissection, vasculitis or significant stenosis. Renals: Both renal arteries are patent without evidence of aneurysm, dissection, vasculitis, fibromuscular dysplasia or significant stenosis. IMA: Patent RIGHT Lower Extremity Inflow: Atherosclerotic calcifications involving the right iliac arteries without significant stenosis. Right common, right internal and right external iliac artery are patent. Outflow: Circumferential calcifications in the right common femoral artery without significant stenosis. Right SFA is diffusely calcified and there appears to be a patent stent in the mid right SFA. Right profunda femoral arteries are heavily calcified but patent. Diffuse atherosclerotic calcifications in the popliteal artery with at least 2 focal areas of stenosis from calcified plaque. Most severe stenosis is the level of the knee joint and similar to the previous examination. Runoff: Right runoff vessels are heavily calcified and difficult to evaluate. Proximal runoff vessels appear to be patent but limited evaluation of the distal runoff arteries. LEFT Lower Extremity Inflow: Left iliac arteries are heavily calcified without significant stenosis. Left common, internal and external iliac arteries are patent. Outflow: Circumferential calcifications in left common femoral artery without significant stenosis. Left profunda femoral arteries are patent. Diffuse plaque in the left SFA with multifocal stenosis. Again noted is high-grade stenosis in the mid left SFA on sequence 5 image 213. High-grade stenosis in the proximal popliteal artery is again noted. Runoff: Below the knee amputation. Limited evaluation of the proximal runoff vessels due to heavily calcified vessels. Veins: No obvious venous abnormality within the limitations of this arterial phase study. Review of the MIP images confirms the above findings. NON-VASCULAR  Lower chest: Airspace disease in the left upper lobe, right middle lobe and some patchy densities in bilateral lower lobes. Lung findings are most compatible with multifocal pneumonia. No significant pleural effusions. Hepatobiliary: Slightly nodular contour of the liver is compatible with underlying cirrhosis. Patient has known lesions in the central aspect of the liver that are poorly characterized on this examination. Limited evaluation of the portal venous system within the liver. Small calcified gallstone without gallbladder distension. Pancreas: Again noted is subtle low-density between the pancreatic head and the duodenum which could represent edema. Otherwise, normal appearance of the pancreas. No pancreatic duct dilatation. Spleen: Normal in size without focal abnormality. Adrenals/Urinary Tract: Normal adrenal glands. Both kidneys are atrophic and compatible with history of end-stage renal disease. Small nonobstructive calculus in the left kidney lower pole. No suspicious renal lesions. No hydronephrosis. Normal appearance of  the urinary bladder with mild distension. Stomach/Bowel: No significant bowel dilatation. Mild stranding in the upper abdomen in the gastrohepatic ligament and mild stranding in the porta hepatis. No discrete or focal bowel inflammation. Normal appearance of the stomach. Lymphatic: No significant lymph node enlargement in the abdomen or pelvis. Reproductive: Prostate is unremarkable. Other: No significant ascites. Some stranding in the upper abdomen as described. Negative for free air. Musculoskeletal: Extensive subcutaneous edema involving the left lower extremity at the stump and in the lower thigh and knee. Small left knee joint effusion. There is increased lucency and bone loss involving the left patella compared to the prior examinations. Again noted is evidence for slightly comminuted patellar fracture and this could be pathologic. Small right knee joint effusion. Sclerotic  densities in the distal tibia probably represent small bone infarct. Subcutaneous edema in the right lower leg. IMPRESSION: VASCULAR 1. Diffuse atherosclerotic disease throughout the abdomen, pelvis and bilateral lower extremities. 2. No significant inflow disease. 3. Bilateral outflow disease. Multifocal stenosis in the right popliteal artery. Multifocal stenosis involving the left runoff vessels as described. 4. Limited evaluation of the runoff vessels due to heavily calcified vessels. 5. Main visceral arteries are patent. NON-VASCULAR 1. Multifocal pneumonia. 2. Bony changes involving the left patella with areas of bone loss and fracture. There is extensive soft tissue swelling in the left lower leg at the knee and stump region. Findings are concerning for osteomyelitis involving the left patella. 3. Cirrhosis. Known hepatic lesions are poorly characterized on this examination. 4. Increased stranding in the upper abdomen and porta hepatis region. Findings could be related to cirrhosis. History of duodenal bleeding treated with argon plasma coagulation. These areas of stranding could be related to prior bleeding and treatment. Difficult to exclude acute on chronic disease in this area. No evidence for a bowel perforation. 5. Diffuse subcutaneous edema in lower extremities particularly in the left knee and left stump region. 6. Nonobstructive left renal calculus. These results will be called to the ordering clinician or representative by the Radiologist Assistant, and communication documented in the PACS or [employer]. Electronically Signed   By: Markus Daft M.D.   On: 09/28/2021 12:34   DG Chest Port 1 View  Result Date: 09/28/2021 CLINICAL DATA:  61 year old male with history of weakness, shoulder pain and dark stools since yesterday. EXAM: PORTABLE CHEST 1 VIEW COMPARISON:  Chest x-ray 09/15/2021. FINDINGS: Lung volumes are low. Areas of interstitial prominence and peribronchial cuffing are noted in the  lungs bilaterally, most severe throughout the left mid to lower lung. No consolidative airspace disease. No pleural effusions. No pneumothorax. No pulmonary nodule or mass noted. Pulmonary vasculature is normal. Heart size is mildly enlarged (unchanged). Upper mediastinal contours are within normal limits. Atherosclerotic calcifications are noted in the thoracic aorta. IMPRESSION: 1. Findings are concerning for acute bronchitis, potentially with developing bronchopneumonia in the left lower lobe. 2. Aortic atherosclerosis. Electronically Signed   By: Vinnie Langton M.D.   On: 09/28/2021 07:59   VAS [place] LOWER EXTREMITY VENOUS (DVT)  Result Date: 09/28/2021  Lower Venous DVT Study Patient Name:  [name]  Date of Exam:   09/28/2021 Medical Rec #: [id number]           Accession #:    [id number] Date of Birth: 15-Mar-1961           Patient Gender: M Patient Age:   9 years Exam Location:  [facility] Procedure:      VAS [place] LOWER  EXTREMITY VENOUS (DVT) Referring Phys: Anderson Malta Trynity Skousen --------------------------------------------------------------------------------  Indications: Left lower extremity swelling.  Risk Factors: History of left BKA. Limitations: Patient unable to tolerate compression maneuvers. Comparison Study: 04/14/2021- negative left lower extremity venous duplex Performing Technologist: Sharion Dove RVS  Examination Guidelines: A complete evaluation includes B-mode imaging, spectral Doppler, color Doppler, and power Doppler as needed of all accessible portions of each vessel. Bilateral testing is considered an integral part of a complete examination. Limited examinations for reoccurring indications may be performed as noted. The reflux portion of the exam is performed with the patient in reverse Trendelenburg.  +-----+---------------+---------+-----------+----------+--------------+ RIGHTCompressibilityPhasicitySpontaneityPropertiesThrombus Aging  +-----+---------------+---------+-----------+----------+--------------+ CFV  Full           Yes      Yes                                 +-----+---------------+---------+-----------+----------+--------------+   +---------+---------------+---------+-----------+----------+--------------+ LEFT     CompressibilityPhasicitySpontaneityPropertiesThrombus Aging +---------+---------------+---------+-----------+----------+--------------+ CFV                     Yes      Yes        patent                   +---------+---------------+---------+-----------+----------+--------------+ SFJ                     Yes      Yes        patent                   +---------+---------------+---------+-----------+----------+--------------+ FV Prox                 Yes      Yes        patent                   +---------+---------------+---------+-----------+----------+--------------+ FV Mid                  Yes      Yes        patent                   +---------+---------------+---------+-----------+----------+--------------+ FV Distal               Yes      Yes        patent                   +---------+---------------+---------+-----------+----------+--------------+ PFV                     Yes      Yes        patent                   +---------+---------------+---------+-----------+----------+--------------+ POP                     Yes      Yes        patent                   +---------+---------------+---------+-----------+----------+--------------+     Summary: RIGHT: - No evidence of common femoral vein obstruction.  LEFT: - No cystic structure found in the popliteal fossa. - No evidence of deep vein thrombosis by color and pulsed wave Doppler involving left lower extremity veins.  *See table(s) above for measurements and observations.  Preliminary     EKG: Independently reviewed.  NSR with rate 91; no evidence of acute ischemia   Labs on Admission: I have personally  reviewed the available labs and imaging studies at the time of the admission.  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 -Will observe on telemetry -Transfuse 2 units PRBC to start and recheck Hgb afterwards (5pm, 5am) -Hold ASA  L BKA stump edema -Patient with known PAD, recent BKA -His stump is clearly edematous -Given his sx anemia, this raised concern for possible hematoma -After discussion with radiology, CTA runoff study was ordered; this showed diffuse atherosclerotic stenoses as well as L patellar osteo -Given this circumstance, vascular surgery was consulted -DVT US was also ordered - although anticoagulation in this patient would be a huge risk given his recent bleed with concern for ongoing bleeding -Dr. 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 -Continue PO  Protonix BID for now -Also with hepatocellular carcinoma and is planning to undergo liver-targeted therapy with IR -He is continuing to drink  ESRD -Patient on chronic TTS HD -Nephrology prn order set utilized -Nephrology is planning to see the patient for HD Monday if he is still here; if needed sooner, nephrology will need to be notified -Continue phosphate binder, Aranesp  DM -Recent A1c was 5.2 -Diet controlled -There is no current indication for meds at this time  HTN -Continue amlodipine  HLD -Continue Lipitor  Chronic back pain -I have reviewed this patient in the  Controlled Substances Reporting System.  He is receiving medications from only one provider at a time and appears to be taking them as prescribed. -He is not at particularly high risk of opioid misuse, diversion, or overdose.  -Will continue Percocet as needed for pain  Possible PNA -CTA showed multifocal PNA -The patient has a multitude of complaints but none of them are respiratory in nature -For now, would watch to see if respiratory symptoms and/or fever develop  Tobacco dependence -Encourage cessation.   -This was discussed with the patient and should be reviewed on an ongoing basis.   -Patch ordered at patient request.    Advance Care Planning:   Code Status: Full Code   Consults: GI (telephone only); vascular surgery; nephrology (telephone only)  DVT Prophylaxis: SCDs  Family Communication: None present; he declined to have me contact family  at the time of admission  Severity of Illness: The appropriate patient status for this patient is OBSERVATION. Observation status is judged to be reasonable and necessary in order to provide the required intensity of service to ensure the patient's safety. The patient's presenting symptoms, physical exam findings, and initial radiographic and laboratory data in the context of their medical condition is felt to place them at decreased risk for further  clinical deterioration. Furthermore, it is anticipated that the patient will be medically stable for discharge from the hospital within 2 midnights of admission.   Author: Karmen Bongo, MD 09/28/2021 5:54 PM  For on call review [email or web address].

## 2021-09-28 NOTE — ED Notes (Signed)
Hgb 6.9

## 2021-09-28 NOTE — ED Provider Notes (Signed)
[facility] EMERGENCY DEPARTMENT Provider Note   CSN: [id number] Arrival date & time: 09/28/21  7829     History  Chief Complaint  Patient presents with   Shoulder Pain    Daniel [name] is a 61 y.o. male.  61 year old male with past medical history significant for recurrent GI bleed secondary to duodenal ulcer/AVM, EtOH cirrhosis, hepatocellular carcinoma, right upper lobe lung mass, HTN, PAD s/p left BKA, ESRD on HD, chronic back pain secondary to multilevel lumbar spine osteoarthritis who presented to [facility] ED on 5/20 with dark tarry stools, generalized weakness.  Patient reports that he was told earlier this week that his hemoglobin was low and that he needed to be transfused again.  He reports feeling more fatigued and tired over the last several days.  Patient reports some darker colored stools earlier this week.  He reports that over the last several days his stool has become lighter in color but is still loose.  He denies nausea or vomiting.  He also complains of chronic shoulder pain and chronic pain/edema to his left BKA stump.  His last dialysis session was on Friday without any reported problems.  He is due for dialysis on Monday.  The history is provided by the patient and medical records.  Illness Location:  Reported anemia.  Reported weakness and fatigue. Severity:  Moderate Onset quality:  Gradual Duration:  1 week Timing:  Constant Progression:  Worsening Chronicity:  Recurrent     Home Medications Prior to Admission medications   Medication Sig Start Date End Date Taking? Authorizing Provider  amLODipine (NORVASC) 5 MG tablet Take 1 tablet (5 mg total) by mouth daily. 09/18/21 12/17/21  [place] Donnamarie Poag, DO  aspirin EC 81 MG tablet Take 1 tablet (81 mg total) by mouth daily. 09/18/21 12/17/21  [place] Donnamarie Poag, DO  atorvastatin (LIPITOR) 40 MG tablet Take 1 tablet (40 mg total) by mouth daily. 09/18/21 12/17/21  [place] Eric J, DO  calcium  acetate, Phos Binder, (PHOSLYRA) 667 MG/5ML SOLN Take 5 mLs (667 mg total) by mouth 3 (three) times daily with meals. 09/18/21 12/17/21  [place] Donnamarie Poag, DO  Darbepoetin Alfa (ARANESP) 100 MCG/0.5ML SOSY injection Inject 0.5 mLs (100 mcg total) into the vein every 7 (seven) days for 8 doses. Patient not taking: Reported on 09/15/2021 08/19/21 10/08/21  Dana Allan I, MD  gabapentin (NEURONTIN) 100 MG capsule Take 1 capsule (100 mg total) by mouth 3 (three) times daily. 09/18/21 12/17/21  [place] Donnamarie Poag, DO  ivabradine (CORLANOR) 5 MG TABS tablet Take 1 tablet (5 mg total) by mouth 2 (two) times daily with a meal. Take 2 Tablets 90-120 minutes prior to Scan. 09/26/21   Warren Lacy, PA-C  methocarbamol (ROBAXIN) 500 MG tablet Take 1 tablet (500 mg total) by mouth every 6 (six) hours as needed for muscle spasms. 08/27/21   Bonnielee Haff, MD  metoprolol tartrate (LOPRESSOR) 100 MG tablet Take 1 tablet (100 mg total) by mouth 2 (two) times daily. Take 1 Tablet 90-120 minutes prior to scan. 09/26/21 12/25/21  Warren Lacy, PA-C  oxyCODONE-acetaminophen (PERCOCET) 5-325 MG tablet Take 1 tablet by mouth every 6 (six) hours as needed. 09/25/21   Milton Ferguson, MD  pantoprazole (PROTONIX) 40 MG tablet Take 1 tablet (40 mg total) by mouth 2 (two) times daily before a meal. 09/18/21 12/17/21  [place] Donnamarie Poag, DO  traZODone (DESYREL) 50 MG tablet Take 1 tablet (50 mg total) by mouth at bedtime as needed  for sleep. 09/18/21 10/18/21  [place] Eric J, DO  colchicine 0.6 MG tablet Take 0.5 tablets (0.3 mg total) by mouth 2 (two) times daily. 07/24/20 07/29/20  Noemi Chapel, MD  furosemide (LASIX) 40 MG tablet Take 1 tablet (40 mg total) by mouth daily. 07/25/19 02/09/20  Ladona Horns, MD      Allergies    Seroquel [quetiapine] and Dilaudid [hydromorphone hcl]    Review of Systems   Review of Systems  All other systems reviewed and are negative.  Physical Exam Updated Vital Signs BP (!) 184/76   Pulse 94    Temp (!) 97.5 F (36.4 C) (Oral)   Resp (!) 30   Ht '5\' 11"'$  (1.803 m)   Wt 110.9 kg   SpO2 99%   BMI 34.10 kg/m  Physical Exam Vitals and nursing note reviewed.  Constitutional:      General: He is not in acute distress.    Appearance: Normal appearance. He is well-developed.  HENT:     Head: Normocephalic and atraumatic.  Eyes:     Conjunctiva/sclera: Conjunctivae normal.     Pupils: Pupils are equal, round, and reactive to light.  Cardiovascular:     Rate and Rhythm: Normal rate and regular rhythm.     Heart sounds: Normal heart sounds.  Pulmonary:     Effort: Pulmonary effort is normal. No respiratory distress.     Breath sounds: Normal breath sounds.  Abdominal:     General: There is no distension.     Palpations: Abdomen is soft.     Tenderness: There is no abdominal tenderness.  Musculoskeletal:        General: No deformity. Normal range of motion.     Cervical back: Normal range of motion and neck supple.     Comments: Status post left AKA.  Mild edema noted in the left BKA stump.  No evidence of cellulitis or abscess on inspection of the healing stump.  Skin:    General: Skin is warm and dry.  Neurological:     General: No focal deficit present.     Mental Status: He is alert and oriented to person, place, and time.    ED Results / Procedures / Treatments   Labs (all labs ordered are listed, but only abnormal results are displayed) Labs Reviewed  COMPREHENSIVE METABOLIC PANEL - Abnormal; Notable for the following components:      Result Value   Glucose, Bld 132 (*)    BUN 28 (*)    Creatinine, Ser 5.88 (*)    Total Protein 8.4 (*)    Albumin 2.5 (*)    AST 43 (*)    GFR, Estimated 10 (*)    Anion gap 16 (*)    All other components within normal limits  CBC - Abnormal; Notable for the following components:   RBC 2.06 (*)    Hemoglobin 6.9 (*)    HCT 20.8 (*)    MCV 101.0 (*)    RDW 19.0 (*)    All other components within normal limits  PROTIME-INR   POC OCCULT BLOOD, ED  TYPE AND SCREEN  PREPARE RBC (CROSSMATCH)    EKG EKG Interpretation  Date/Time:  Saturday September 28 2021 07:41:50 EDT Ventricular Rate:  91 PR Interval:  166 QRS Duration: 93 QT Interval:  406 QTC Calculation: 500 R Axis:   -28 Text Interpretation: Sinus rhythm Left ventricular hypertrophy Inferior infarct, old Anterior infarct, old Confirmed by Dene Gentry [phone]) on 09/28/2021 8:32:20 AM  Radiology DG Chest Port 1 View  Result Date: 09/28/2021 CLINICAL DATA:  61 year old male with history of weakness, shoulder pain and dark stools since yesterday. EXAM: PORTABLE CHEST 1 VIEW COMPARISON:  Chest x-ray 09/15/2021. FINDINGS: Lung volumes are low. Areas of interstitial prominence and peribronchial cuffing are noted in the lungs bilaterally, most severe throughout the left mid to lower lung. No consolidative airspace disease. No pleural effusions. No pneumothorax. No pulmonary nodule or mass noted. Pulmonary vasculature is normal. Heart size is mildly enlarged (unchanged). Upper mediastinal contours are within normal limits. Atherosclerotic calcifications are noted in the thoracic aorta. IMPRESSION: 1. Findings are concerning for acute bronchitis, potentially with developing bronchopneumonia in the left lower lobe. 2. Aortic atherosclerosis. Electronically Signed   By: Vinnie Langton M.D.   On: 09/28/2021 07:59    Procedures Procedures    Medications Ordered in ED Medications  0.9 %  sodium chloride infusion (has no administration in time range)    ED Course/ Medical Decision Making/ A&P Clinical Course as of 09/28/21 0939  Sat Sep 28, 2021  0858 POC occult blood, ED [PM]    Clinical Course User Index [PM] Valarie Merino, MD                           Medical Decision Making Amount and/or Complexity of Data Reviewed Labs: ordered. Decision-making details documented in ED Course. Radiology: ordered.  Risk Prescription drug management. Decision  regarding hospitalization.    Medical Screen Complete  This patient presented to the ED with complaint of anemia, ESRD.  This complaint involves an extensive number of treatment options. The initial differential diagnosis includes, but is not limited to, anemia, metabolic abnormality, etc.  This presentation is: Acute, Chronic, Self-Limited, Previously Undiagnosed, Uncertain Prognosis, Complicated, Systemic Symptoms, and Threat to Life/Bodily Function  Patient with multiple comorbidities including ESRD on dialysis, history of GI bleeding, and history of transfusion in the past presents with complaint of increased weakness and reported worsening anemia.  Patient's presentation is concerning for possible occult continued GI bleeding.  Patient would benefit from transfusion.  Case discussed briefly with nephrology Dr. Jonnie Finner -who agrees with plan of care per  Hospitalist services aware of case will evaluate for admission.  [facility] made aware of case and will evaluate.  Co morbidities that complicated the patient's evaluation  ESRD, on HD, anemia, history of duodenal ulcer, EtOH cirrhosis, hepatocellular carcinoma, hypertension, PAD   Additional history obtained:  External records from outside sources obtained and reviewed including prior ED visits and prior Inpatient records.    Lab Tests:  I ordered and personally interpreted labs.  The pertinent results include: CBC, CMP, INR   Imaging Studies ordered:  I ordered imaging studies including chest x-ray I independently visualized and interpreted obtained imaging which showed NAD I agree with the radiologist interpretation.   Cardiac Monitoring:  The patient was maintained on a cardiac monitor.  I personally viewed and interpreted the cardiac monitor which showed an underlying rhythm of: NSR   Problem List / ED Course:  Anemia   Reevaluation:  After the interventions noted above, I reevaluated the patient and  found that they have: stayed the same   Disposition:  After consideration of the diagnostic results and the patients response to treatment, I feel that the patent would benefit from admission.          Final Clinical Impression(s) / ED Diagnoses Final diagnoses:  Anemia, unspecified type  Rx / DC Orders ED Discharge Orders     None         Valarie Merino, MD 09/28/21 [phone]

## 2021-09-28 NOTE — Progress Notes (Signed)
VASCULAR LAB    Left lower extremity venous duplex has been performed.  See CV proc for preliminary results.   Donya Tomaro, RVT 09/28/2021, 4:39 PM

## 2021-09-28 NOTE — ED Triage Notes (Signed)
The pt thinks his blood is low  because his shoulder are hurting and he has had dark stools since yesterday

## 2021-09-28 NOTE — Consult Note (Signed)
ASSESSMENT & PLAN   S/P LEFT BKA: Although his CT scan shows bone loss and and what looks like fracture of the patella he does not have any evidence of infection around the knee on exam.  He is afebrile with a normal white blood cell count.  If he did have osteomyelitis of the patella his only option would be an above-the-knee amputation.  This patellar fracture is not new.  The patient was seen in consultation on 08/17/2021 by our service and at that time had a patellar fracture.  He feels very strongly against this.  He has swelling in both lower extremities and thus the swelling is not limited to his left BKA.  I think for now we can simply follow his exam.  The BKA appears adequately perfused with no open ulcers.  The emergency department has ordered a bilateral venous duplex scan given his lower extremity swelling.  LEFT CAROTID BRUIT: He does have a left carotid bruit.  I will get a carotid duplex scan on Monday.  REASON FOR CONSULT:    Question osteomyelitis of patella on left BKA.  Consult is requested by Dr. Lorin Mercy.   HPI:   Daniel [name] is a 61 y.o. male who I had seen in consultation on 03/30/2021 with pain in both feet and wounds on the left foot.  He underwent an arteriogram on 04/08/2021.  On the left side there was no significant inflow disease.  There was diffuse calcific disease.  The superficial femoral artery and popliteal artery were patent on the left.  There was two-vessel runoff via the anterior tibial and peroneal arteries.  The anterior tibial artery was occluded distally.  The posterior tibial artery was occluded.  There were no options for revascularization.  Ultimately he required a left below the knee amputation that was done on 04/16/2021.  He had poor wound healing of the left below the knee amputation and a CT scan demonstrated no concerns for abscess and no bony involvement.  He underwent a washout and debridement on 06/17/2021 and placement of a VAC.  The wound  at that time measured 11 cm x 2 cm x 1 cm.  There was devitalized subcutaneous tissue and skin only.  He was subsequently seen by Dr. Stanford Breed on 08/17/2021.  He was noted to have the patellar fracture at that time.  His BKA was healing well at that time to  On my history, the patient denies any fever or chills.  He has had no significant right leg pain.  He denies any history of stroke, TIAs, expressive or receptive aphasia, or amaurosis fugax.  He smokes a half a pack per day.  He does have DM , hypertension and end-stage renal disease.  He has a functioning left upper arm fistula.  He is admitted today with a GI bleed and anemia.  Past Medical History:  Diagnosis Date   Anemia    Diabetes mellitus without complication [facility])    patient denies   Dialysis patient [facility])    End stage chronic kidney disease ([place])    Hypertension    ICH (intracerebral hemorrhage) ([facility]) 05/20/2017   PAD (peripheral artery disease) ([facility])    Shoulder pain, left 06/28/2013    Family History  Problem Relation Age of Onset   Colon cancer Neg Hx     SOCIAL HISTORY: Social History   Tobacco Use   Smoking status: Every Day    Packs/day: 0.50    Years: 45.00    Pack  years: 22.50    Types: Cigarettes   Smokeless tobacco: Never   Tobacco comments:    Pt left before information given  Substance Use Topics   Alcohol use: Not Currently    Allergies  Allergen Reactions   Seroquel [Quetiapine] Other (See Comments)    Tardive kinesia/dystonia   Dilaudid [Hydromorphone Hcl] Itching and Other (See Comments)    Pt reports itchiness after IM injection     Current Facility-Administered Medications  Medication Dose Route Frequency Provider Last Rate Last Admin   acetaminophen (TYLENOL) tablet 650 mg  650 mg Oral Q6H PRN Karmen Bongo, MD       Or   acetaminophen (TYLENOL) suppository 650 mg  650 mg Rectal Q6H PRN Karmen Bongo, MD       amLODipine (NORVASC) tablet 5 mg  5 mg Oral Daily Karmen Bongo, MD       atorvastatin (LIPITOR) tablet 40 mg  40 mg Oral Daily Karmen Bongo, MD       calcium acetate (Phos Binder) (PHOSLYRA) 667 MG/5ML oral solution 667 mg  667 mg Oral TID WC Karmen Bongo, MD       calcium carbonate (dosed in mg elemental calcium) suspension 500 mg of elemental calcium  500 mg of elemental calcium Oral Q6H PRN Karmen Bongo, MD       camphor-menthol [facility] lotion 1 application.  1 application. Topical Q8H PRN Karmen Bongo, MD       And   hydrOXYzine (ATARAX) tablet 25 mg  25 mg Oral Q8H PRN Karmen Bongo, MD       docusate sodium [facility]) enema 283 mg  1 enema Rectal PRN Karmen Bongo, MD       feeding supplement (NEPRO CARB STEADY) liquid 237 mL  237 mL Oral TID PRN Karmen Bongo, MD       gabapentin (NEURONTIN) capsule 100 mg  100 mg Oral TID Karmen Bongo, MD       hydrALAZINE (APRESOLINE) injection 5 mg  5 mg Intravenous Q4H PRN Karmen Bongo, MD       ivabradine Steffanie Dunn) tablet 5 mg  5 mg Oral BID WC Karmen Bongo, MD       methocarbamol (ROBAXIN) tablet 500 mg  500 mg Oral Q6H PRN Karmen Bongo, MD       metoprolol tartrate (LOPRESSOR) tablet 100 mg  100 mg Oral BID Karmen Bongo, MD       ondansetron [facility]) tablet 4 mg  4 mg Oral Q6H PRN Karmen Bongo, MD       Or   ondansetron [facility]) injection 4 mg  4 mg Intravenous Q6H PRN Karmen Bongo, MD       oxyCODONE-acetaminophen (PERCOCET/ROXICET) 5-325 MG per tablet 1 tablet  1 tablet Oral Q6H PRN Karmen Bongo, MD       pantoprazole (PROTONIX) EC tablet 40 mg  40 mg Oral BID Meliton Rattan, MD       sodium chloride flush (NS) 0.9 % injection 3 mL  3 mL Intravenous Lillia Mountain, MD   3 mL at 09/28/21 1306   sorbitol 70 % solution 30 mL  30 mL Oral PRN Karmen Bongo, MD       traZODone (DESYREL) tablet 50 mg  50 mg Oral QHS PRN Karmen Bongo, MD       zolpidem Lorrin Mais) tablet 5 mg  5 mg Oral QHS PRN Karmen Bongo, MD       Current Outpatient Medications   Medication Sig Dispense Refill   amLODipine (  NORVASC) 5 MG tablet Take 1 tablet (5 mg total) by mouth daily. 30 tablet 2   aspirin EC 81 MG tablet Take 1 tablet (81 mg total) by mouth daily. 30 tablet 2   atorvastatin (LIPITOR) 40 MG tablet Take 1 tablet (40 mg total) by mouth daily. 30 tablet 2   calcium acetate, Phos Binder, (PHOSLYRA) 667 MG/5ML SOLN Take 5 mLs (667 mg total) by mouth 3 (three) times daily with meals. 450 mL 2   gabapentin (NEURONTIN) 100 MG capsule Take 1 capsule (100 mg total) by mouth 3 (three) times daily. 90 capsule 2   ivabradine (CORLANOR) 5 MG TABS tablet Take 1 tablet (5 mg total) by mouth 2 (two) times daily with a meal. Take 2 Tablets 90-120 minutes prior to Scan. 2 tablet 0   methocarbamol (ROBAXIN) 500 MG tablet Take 1 tablet (500 mg total) by mouth every 6 (six) hours as needed for muscle spasms. 30 tablet 0   metoprolol tartrate (LOPRESSOR) 100 MG tablet Take 1 tablet (100 mg total) by mouth 2 (two) times daily. Take 1 Tablet 90-120 minutes prior to scan. 1 tablet 0   oxyCODONE-acetaminophen (PERCOCET) 5-325 MG tablet Take 1 tablet by mouth every 6 (six) hours as needed. 20 tablet 0   traZODone (DESYREL) 50 MG tablet Take 1 tablet (50 mg total) by mouth at bedtime as needed for sleep. 30 tablet 0   Darbepoetin Alfa (ARANESP) 100 MCG/0.5ML SOSY injection Inject 0.5 mLs (100 mcg total) into the vein every 7 (seven) days for 8 doses. (Patient not taking: Reported on 09/15/2021) 4 mL 0   pantoprazole (PROTONIX) 40 MG tablet Take 1 tablet (40 mg total) by mouth 2 (two) times daily before a meal. 60 tablet 2    REVIEW OF SYSTEMS:  '[X]'$  denotes positive finding, '[ ]'$  denotes negative finding Cardiac  Comments:  Chest pain or chest pressure:    Shortness of breath upon exertion:    Short of breath when lying flat:    Irregular heart rhythm:        Vascular    Pain in calf, thigh, or hip brought on by ambulation:    Pain in feet at night that wakes you up from your  sleep:     Blood clot in your veins:    Leg swelling:  x       Pulmonary    Oxygen at home:    Productive cough:     Wheezing:         Neurologic    Sudden weakness in arms or legs:     Sudden numbness in arms or legs:     Sudden onset of difficulty speaking or slurred speech:    Temporary loss of vision in one eye:     Problems with dizziness:         Gastrointestinal    Blood in stool:     Vomited blood:         Genitourinary    Burning when urinating:     Blood in urine:        Psychiatric    Major depression:         Hematologic    Bleeding problems:    Problems with blood clotting too easily:        Skin    Rashes or ulcers:        Constitutional    Fever or chills:    -  PHYSICAL EXAM:   Vitals:   09/28/21  1029 09/28/21 1100 09/28/21 1330 09/28/21 1402  BP: (!) 152/45 (!) 170/57 (!) 182/90   Pulse: 90 92 92 90  Resp: (!) '23 16 15 16  '$ Temp: (!) 96.6 F (35.9 C)   97.9 F (36.6 C)  TempSrc: Axillary   Axillary  SpO2: 100% 100% 97%   Weight:      Height:       Body mass index is 34.1 kg/m. GENERAL: The patient is a well-nourished male, in no acute distress. The vital signs are documented above. CARDIAC: There is a regular rate and rhythm.  VASCULAR: He has a left carotid bruit. He has palpable femoral pulses. He has brisk Doppler signals in the dorsalis pedis and posterior tibial position on the right. He has functioning left upper arm fistula.  This has a good thrill. He has swelling of his left BKA and also of his right lower extremity.   PULMONARY: There is good air exchange bilaterally without wheezing or rales. ABDOMEN: Soft and non-tender with normal pitched bowel sounds.  MUSCULOSKELETAL: He has a left BKA.   NEUROLOGIC: No focal weakness or paresthesias are detected. SKIN: There are no ulcers or rashes noted. PSYCHIATRIC: The patient has a normal affect.  DATA:    CT ANGIO WITH RUNOFF: A CT angio with runoff was ordered by the  emergency department.  This showed multifocal pneumonia.  Enosis showed cirrhosis with some known hepatic lesions.  There was diffuse subcutaneous edema in the lower extremities particularly the left knee and left stump.  There were bony changes involving the left patella with areas of bone loss and fracture.  LABS: His white blood cell count is 4.8.  Hemoglobin 6.9.  Platelets 153,000.    Deitra Mayo [facility]

## 2021-09-28 NOTE — ED Notes (Signed)
Patient requesting additional ice. Educated patient on the importance of fluid restrictions on dialysis Patient states he does not know about this and does not follow one at home.

## 2021-09-29 DIAGNOSIS — M9689 Other intraoperative and postprocedural complications and disorders of the musculoskeletal system: Secondary | ICD-10-CM | POA: Diagnosis not present

## 2021-09-29 DIAGNOSIS — D649 Anemia, unspecified: Secondary | ICD-10-CM | POA: Diagnosis not present

## 2021-09-29 LAB — CBC
HCT: 31.1 % — ABNORMAL LOW (ref 39.0–52.0)
Hemoglobin: 11.3 g/dL — ABNORMAL LOW (ref 13.0–17.0)
MCH: 34 pg (ref 26.0–34.0)
MCHC: 36.3 g/dL — ABNORMAL HIGH (ref 30.0–36.0)
MCV: 93.7 fL (ref 80.0–100.0)
Platelets: 132 10*3/uL — ABNORMAL LOW (ref 150–400)
RBC: 3.32 MIL/uL — ABNORMAL LOW (ref 4.22–5.81)
RDW: 20.7 % — ABNORMAL HIGH (ref 11.5–15.5)
WBC: 5.8 10*3/uL (ref 4.0–10.5)
nRBC: 0 % (ref 0.0–0.2)

## 2021-09-29 LAB — TYPE AND SCREEN
ABO/RH(D): B POS
Antibody Screen: NEGATIVE
Unit division: 0
Unit division: 0

## 2021-09-29 LAB — BPAM RBC
Blood Product Expiration Date: 202306192359
Blood Product Expiration Date: 202306192359
ISSUE DATE / TIME: 202306030958
ISSUE DATE / TIME: 202306031511
Unit Type and Rh: 7300
Unit Type and Rh: 7300

## 2021-09-29 LAB — PROCALCITONIN: Procalcitonin: 0.98 ng/mL

## 2021-09-29 MED ORDER — CEFDINIR 300 MG PO CAPS
300.0000 mg | ORAL_CAPSULE | ORAL | Status: AC
Start: 2021-09-29 — End: 2021-09-29
  Administered 2021-09-29: 300 mg via ORAL
  Filled 2021-09-29: qty 1

## 2021-09-29 MED ORDER — HYDRALAZINE HCL 25 MG PO TABS: 25.0000 mg | ORAL_TABLET | Freq: Three times a day (TID) | ORAL | Status: DC | PRN

## 2021-09-29 MED ORDER — CEFDINIR 300 MG PO CAPS: 300.0000 mg | ORAL_CAPSULE | ORAL | 0 refills | Status: DC

## 2021-09-29 MED ORDER — DOXYCYCLINE MONOHYDRATE 100 MG PO TABS: 100.0000 mg | ORAL_TABLET | Freq: Two times a day (BID) | ORAL | 0 refills | Status: AC

## 2021-09-29 NOTE — Progress Notes (Signed)
   VASCULAR SURGERY ASSESSMENT & PLAN:   S/P LEFT BKA: The patient's venous duplex scan was negative.  I do not think the swelling in his BKA is from underlying infection.  The patellar fracture is old.  He was seen back in March and had the fracture at that time.  Currently I do not think the BKA is a significant problem.  We can continue to follow this as an outpatient.   SUBJECTIVE:   No complaints this morning.  PHYSICAL EXAM:   Vitals:   09/28/21 1814 09/28/21 1834 09/28/21 1923 09/29/21 0455  BP:  (!) 178/46 (!) 193/54 (!) 190/68  Pulse:  (!) 103 (!) 107 94  Resp: '20 17  17  '$ Temp:  99.1 F (37.3 C) 98.6 F (37 C) 98.2 F (36.8 C)  TempSrc:  Oral Oral Oral  SpO2:  92% 96% 99%  Weight:      Height:       Swelling and the left BKA and right leg is about the same.  LABS:   Lab Results  Component Value Date   WBC 5.8 09/29/2021   HGB 11.3 (L) 09/29/2021   HCT 31.1 (L) 09/29/2021   MCV 93.7 09/29/2021   PLT 132 (L) 09/29/2021   Lab Results  Component Value Date   CREATININE 6.46 (H) 09/28/2021   Lab Results  Component Value Date   INR 1.0 09/28/2021   CBG (last 3)  No results for input(s): GLUCAP in the last 72 hours.  PROBLEM LIST:    Principal Problem:   Symptomatic anemia Active Problems:   Essential hypertension   DM2 (diabetes mellitus, type 2) ([facility])   ESRD (end stage renal disease) on dialysis ([facility])   Tobacco use   PAD (peripheral artery disease) ([facility])   Hepatocellular carcinoma ([facility])   Recurrent gastrointestinal hemorrhage   Edema of amputation stump of left lower extremity ([facility])   Chronic pain disorder   CURRENT MEDS:    amLODipine  5 mg Oral Daily   atorvastatin  40 mg Oral Daily   calcium acetate (Phos Binder)  667 mg Oral TID WC   gabapentin  100 mg Oral TID   nicotine  14 mg Transdermal Daily   pantoprazole  40 mg Oral BID AC   sodium chloride flush  3 mL Intravenous Q12H    Deitra Mayo Office: [phone] 09/29/2021

## 2021-09-29 NOTE — Care Management Obs Status (Signed)
[place] NOTIFICATION   Patient Details  Name: MADIX BLOWE MRN: [id number] Date of Birth: [birth year]   Medicare Observation Status Notification Given:  Yes    Bartholomew Crews, RN 09/29/2021, 12:50 PM

## 2021-09-29 NOTE — Progress Notes (Signed)
Orthopedic Tech Progress Note Patient Details:  Daniel [name] [birth year] [id number]  Patient ID: [name], male   DOB: 1960/10/11, 61 y.o.   MRN: [id number] Placed order with [facility], pt has information to follow up as an outpatient.  Vernona Rieger 09/29/2021, 3:40 PM

## 2021-09-29 NOTE — Discharge Summary (Signed)
Physician Discharge Summary  Daniel [name] PJK:[id number] DOB: [birth year] DOA: 09/28/2021  PCP: [name], NP  Admit date: 09/28/2021 Discharge date: 09/29/2021 Recommendations for Outpatient Follow-up:  Follow up with PCP in 1 weeks-call for appointment Please obtain BMP/CBC in one week Follow-up with PCP for chest x-ray and 3 weeks  Discharge Dispo: Home Discharge Condition: Stable Code Status:   Code Status: Full Code Diet recommendation:  Diet Order             Diet renal with fluid restriction Fluid restriction: 1200 mL Fluid; Room service appropriate? 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 In ED: Lab work showed severe anemia hemoglobin 6.9 g heme-negative GI consulted and admitted. Patient was monitored additional night after transfusion hemoglobin has significantly improved appropriately, seen by vascular no concern for infection.  Patient is alert awake oriented x3.  He had some chronic shoulder pain but stable and will be discharged home once tolerating diet with follow-up for dialysis tomorrow and  CBC check  from PCP in a week.  Follow-up chest x-ray with Pcp in 3 weeks.    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)   Chronic pain disorder  Symptomatic anemia Recurrent gastrointestinal hemorrhage-recent GI bleed from AVM and varices treated with APC History of cirrhosis: Hemoccult negative in the ED, H&H 6.9 g, transfused unit PRBC aspirin held GI consulted who advised since negative alcohol no indication for rescope since he had a recent scope and GI prefers to consult iF bleeding declares itself-he has been doing well tolerating diet, he feels well enough to go home today and aware that if he has any kind of bleeding blackish stool or blood in the stool or vomiting, he needs to seek immediate medical attention in the ED or call his PCP.  He is also advised to check CBC in 5 days from PCP  Left BKA stump edema PAD: Seen by Dr. Scot Dock venous duplex negative does not believe swelling in his BKA is from underlying infection, patellar fracture is old, advised to continue follow-up as outpatient continue Lipitor and aspirin on hold.  Hemoglobin stabilizing improved to 11.3 g.  ESRD on HD MWF: Patient reports he is due for dialysis on Monday and he will follow-up outpatient.   Hepatocellular carcinoma-is planning to undergo liver-targeted therapy with IR   Essential hypertension: Stable DM2: Controlled. Tobacco use-cessation advised Chronic pain disorder/chronic  back pain: Continue home pain regimen  Airspace disease in the LUL,RM,some patchy densities in b/l lower lobes community-acquired pneumonia:has had no significant respiratory complaint or fever, complains of shoulder pain.procalcitonin slightly elevated will discharge on Omnicef and doxycycline to complete at least 5 days course , and outpatient follow-up for repeat chest  x-ray PA lateral and week from PCP  Consults: Gi on phone Subjective: Alert awake resting comfortably denies any black stool or any bleeding. Feels well enough to go home today.  Discharge Exam: Vitals:   09/29/21 0841 09/29/21 1214  BP: (!) 168/84   Pulse: 94   Resp: 18 17  Temp: 98 F (36.7 C)   SpO2: 96%   General: Pt is alert, awake, not in acute distress Cardiovascular: RRR, S1/S2 +, no rubs, no gallops Respiratory: CTA bilaterally, no wheezing, no rhonchi Abdominal: Soft, NT, ND, bowel sounds + Extremities: no edema, no cyanosis  Discharge Instructions  Discharge Instructions     Discharge instructions   Complete by: As directed    Follow-up with PCP check CBC in 1 week to make sure hemoglobin stays good Your CT scan showed possible pneumonia so you are being given antibiotics follow-up with PCP to get chest x-ray done in 2-3 weeks to make sure it has resolved if not delayed pulmonary follow-up and further work-up of  Please call call MD or return to ER for similar or worsening recurring problem that brought you to hospital or if any fever,nausea/vomiting,abdominal pain, uncontrolled pain, chest pain,  shortness of breath or any other alarming symptoms.  Please follow-up your doctor as instructed in a week time and call the office for appointment.  Please avoid alcohol, smoking, or any other illicit substance and maintain healthy habits including taking your regular medications as prescribed.  You were cared for by a hospitalist during your hospital stay. If you have any questions about your discharge medications or the care you received while you were in the hospital after you are discharged, you can call the unit and ask to speak with the hospitalist on call if the hospitalist that took care of you is not available.  Once you are discharged, your primary care physician will handle any further medical issues. Please note that NO REFILLS for any discharge medications will  be authorized once you are discharged, as it is imperative that you return to your primary care physician (or establish a relationship with a primary care physician if you do not have one) for your aftercare needs so that they can reassess your need for medications and monitor your lab values   Increase activity slowly   Complete by: As directed    No wound care   Complete by: As directed       Allergies as of 09/29/2021       Reactions   Seroquel [quetiapine] Other (See Comments)   Tardive kinesia/dystonia   Dilaudid [hydromorphone Hcl] Itching, Other (See Comments)   Pt reports itchiness after IM injection         Medication List     TAKE these medications    amLODipine 5 MG tablet Commonly known as: NORVASC Take 1 tablet (5 mg total) by mouth daily.   aspirin EC 81 MG tablet Take 1 tablet (81 mg total) by mouth daily.   atorvastatin 40 MG tablet Commonly known as: LIPITOR Take 1 tablet (40 mg total) by mouth daily.   calcium acetate (Phos Binder) 667 MG/5ML Soln Commonly known as: PHOSLYRA Take 5 mLs (667  mg total) by mouth 3 (three) times daily with meals.   cefdinir 300 MG capsule Commonly known as: OMNICEF Take 1 capsule (300 mg total) by mouth every Monday, Wednesday, and Friday for 3 doses. Take after dialysis Start taking on: September 30, 2021   Darbepoetin Alfa 100 MCG/0.5ML Sosy injection Commonly known as: ARANESP Inject 0.5 mLs (100 mcg total) into the vein every 7 (seven) days for 8 doses.   doxycycline 100 MG tablet Commonly known as: ADOXA Take 1 tablet (100 mg total) by mouth 2 (two) times daily for 5 days.   gabapentin 100 MG capsule Commonly known as: NEURONTIN Take 1 capsule (100 mg total) by mouth 3 (three) times daily.   ivabradine 5 MG Tabs tablet Commonly known as: CORLANOR Take 1 tablet (5 mg total) by mouth 2 (two) times daily with a meal. Take 2 Tablets 90-120 minutes prior to Scan.   methocarbamol 500 MG tablet Commonly known as:  ROBAXIN Take 1 tablet (500 mg total) by mouth every 6 (six) hours as needed for muscle spasms.   metoprolol tartrate 100 MG tablet Commonly known as: LOPRESSOR Take 1 tablet (100 mg total) by mouth 2 (two) times daily. Take 1 Tablet 90-120 minutes prior to scan.   oxyCODONE-acetaminophen 5-325 MG tablet Commonly known as: Percocet Take 1 tablet by mouth every 6 (six) hours as needed.   pantoprazole 40 MG tablet Commonly known as: PROTONIX Take 1 tablet (40 mg total) by mouth 2 (two) times daily before a meal.   traZODone 50 MG tablet Commonly known as: DESYREL Take 1 tablet (50 mg total) by mouth at bedtime as needed for sleep.        Follow-up Information     [name], NP Follow up in 1 week(s).   Specialty: Internal Medicine Contact information: [id number] [place] [address] [phone]                Allergies  Allergen Reactions   Seroquel [Quetiapine] Other (See Comments)    Tardive kinesia/dystonia   Dilaudid [Hydromorphone Hcl] Itching and Other (See Comments)    Pt reports itchiness after IM injection     The results of significant diagnostics from this hospitalization (including imaging, microbiology, ancillary and laboratory) are listed below for reference.    Microbiology: No results found for this or any previous visit (from the past 240 hour(s)).  Procedures/Studies: DG Shoulder Right  Result Date: 09/25/2021 CLINICAL DATA:  Chronic shoulder pain over the last several months. EXAM: RIGHT SHOULDER - 2+ VIEW COMPARISON:  10/13/2018.  MRI 09/17/2021. FINDINGS: Bone humeral joint appears normal. Humeral acromial distance within normal limits. 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 No acute radiographic finding. IMPRESSION: No acute radiographic finding. No evidence of fracture or significant degenerative change by radiography. Chronic proximal humeral medullary bone infarction. Large rotator cuff tear known to  be present by recent MRI. Electronically Signed   By: Nelson Chimes M.D.   On: 09/25/2021 10:32   CT ANGIO AO+BIFEM W & OR WO CONTRAST  Result Date: 09/28/2021 CLINICAL DATA:  Peripheral arterial disease, asymptomatic. EXAM: CT ANGIOGRAPHY OF ABDOMINAL AORTA WITH ILIOFEMORAL RUNOFF TECHNIQUE: Multidetector CT imaging of the abdomen, pelvis and lower extremities was performed using the standard protocol during bolus administration of intravenous contrast. Multiplanar CT image reconstructions and MIPs were obtained to evaluate the vascular anatomy. RADIATION DOSE REDUCTION: This exam was performed according to the departmental dose-optimization program which includes automated exposure control, adjustment of the  mA and/or kV according to patient size and/or use of iterative reconstruction technique. CONTRAST:  144m OMNIPAQUE IOHEXOL 350 MG/ML SOLN COMPARISON:  CTA runoff 07/03/2021 FINDINGS: VASCULAR Aorta: Diffuse atherosclerotic calcifications in the abdominal aorta without aneurysm or significant stenosis. Celiac: Patent without evidence of aneurysm, dissection, vasculitis or significant stenosis. SMA: Patent without evidence of aneurysm, dissection, vasculitis or significant stenosis. Renals: Both renal arteries are patent without evidence of aneurysm, dissection, vasculitis, fibromuscular dysplasia or significant stenosis. IMA: Patent RIGHT Lower Extremity Inflow: Atherosclerotic calcifications involving the right iliac arteries without significant stenosis. Right common, right internal and right external iliac artery are patent. Outflow: Circumferential calcifications in the right common femoral artery without significant stenosis. Right SFA is diffusely calcified and there appears to be a patent stent in the mid right SFA. Right profunda femoral arteries are heavily calcified but patent. Diffuse atherosclerotic calcifications in the popliteal artery with at least 2 focal areas of stenosis from calcified  plaque. Most severe stenosis is the level of the knee joint and similar to the previous examination. Runoff: Right runoff vessels are heavily calcified and difficult to evaluate. Proximal runoff vessels appear to be patent but limited evaluation of the distal runoff arteries. LEFT Lower Extremity Inflow: Left iliac arteries are heavily calcified without significant stenosis. Left common, internal and external iliac arteries are patent. Outflow: Circumferential calcifications in left common femoral artery without significant stenosis. Left profunda femoral arteries are patent. Diffuse plaque in the left SFA with multifocal stenosis. Again noted is high-grade stenosis in the mid left SFA on sequence 5 image 213. High-grade stenosis in the proximal popliteal artery is again noted. Runoff: Below the knee amputation. Limited evaluation of the proximal runoff vessels due to heavily calcified vessels. Veins: No obvious venous abnormality within the limitations of this arterial phase study. Review of the MIP images confirms the above findings. NON-VASCULAR Lower chest: Airspace disease in the left upper lobe, right middle lobe and some patchy densities in bilateral lower lobes. Lung findings are most compatible with multifocal pneumonia. No significant pleural effusions. Hepatobiliary: Slightly nodular contour of the liver is compatible with underlying cirrhosis. Patient has known lesions in the central aspect of the liver that are poorly characterized on this examination. Limited evaluation of the portal venous system within the liver. Small calcified gallstone without gallbladder distension. Pancreas: Again noted is subtle low-density between the pancreatic head and the duodenum which could represent edema. Otherwise, normal appearance of the pancreas. No pancreatic duct dilatation. Spleen: Normal in size without focal abnormality. Adrenals/Urinary Tract: Normal adrenal glands. Both kidneys are atrophic and compatible  with history of end-stage renal disease. Small nonobstructive calculus in the left kidney lower pole. No suspicious renal lesions. No hydronephrosis. Normal appearance of the urinary bladder with mild distension. Stomach/Bowel: No significant bowel dilatation. Mild stranding in the upper abdomen in the gastrohepatic ligament and mild stranding in the porta hepatis. No discrete or focal bowel inflammation. Normal appearance of the stomach. Lymphatic: No significant lymph node enlargement in the abdomen or pelvis. Reproductive: Prostate is unremarkable. Other: No significant ascites. Some stranding in the upper abdomen as described. Negative for free air. Musculoskeletal: Extensive subcutaneous edema involving the left lower extremity at the stump and in the lower thigh and knee. Small left knee joint effusion. There is increased lucency and bone loss involving the left patella compared to the prior examinations. Again noted is evidence for slightly comminuted patellar fracture and this could be pathologic. Small right knee joint effusion. Sclerotic densities in  the distal tibia probably represent small bone infarct. Subcutaneous edema in the right lower leg. IMPRESSION: VASCULAR 1. Diffuse atherosclerotic disease throughout the abdomen, pelvis and bilateral lower extremities. 2. No significant inflow disease. 3. Bilateral outflow disease. Multifocal stenosis in the right popliteal artery. Multifocal stenosis involving the left runoff vessels as described. 4. Limited evaluation of the runoff vessels due to heavily calcified vessels. 5. Main visceral arteries are patent. NON-VASCULAR 1. Multifocal pneumonia. 2. Bony changes involving the left patella with areas of bone loss and fracture. There is extensive soft tissue swelling in the left lower leg at the knee and stump region. Findings are concerning for osteomyelitis involving the left patella. 3. Cirrhosis. Known hepatic lesions are poorly characterized on this  examination. 4. Increased stranding in the upper abdomen and porta hepatis region. Findings could be related to cirrhosis. History of duodenal bleeding treated with argon plasma coagulation. These areas of stranding could be related to prior bleeding and treatment. Difficult to exclude acute on chronic disease in this area. No evidence for a bowel perforation. 5. Diffuse subcutaneous edema in lower extremities particularly in the left knee and left stump region. 6. Nonobstructive left renal calculus. These results will be called to the ordering clinician or representative by the Radiologist Assistant, and communication documented in the PACS or [employer]. Electronically Signed   By: Markus Daft M.D.   On: 09/28/2021 12:34   DG Pelvis Portable  Result Date: 09/15/2021 CLINICAL DATA:  Fall with hip pain. EXAM: PORTABLE PELVIS 1-2 VIEWS COMPARISON:  Pelvis radiograph 08/11/2021 FINDINGS: No evidence of acute fracture of the pelvis or hips. Femoral heads remain seated. The bones are subjectively under mineralized. Grossly intact pubic rami. Pubic symphysis and sacroiliac joints are congruent. Advanced vascular calcifications. IMPRESSION: No acute fracture of the pelvis or hips. Electronically Signed   By: Keith Rake M.D.   On: 09/15/2021 00:16   MR SHOULDER RIGHT WO CONTRAST  Result Date: 09/17/2021 CLINICAL DATA:  Chronic shoulder pain. Osteoarthritis suspected. Rotator cuff disorder suspected. Evaluate for metastases. EXAM: MRI OF THE RIGHT SHOULDER WITHOUT CONTRAST TECHNIQUE: Multiplanar, multisequence MR imaging of the shoulder was performed. No intravenous contrast was administered. COMPARISON:  Right shoulder radiographs 10/13/2018 FINDINGS: Despite efforts by the technologist and patient, motion artifact is present on today's exam and could not be eliminated. This reduces exam sensitivity and specificity. Rotator cuff: There is a massive full-thickness tear of the entire AP dimension of the  supraspinatus tendon footprint with at least 3.1 cm tendon retraction to the humeral head apex. Within the limitations of patient motion artifact, there is likely partial-thickness anterior infraspinatus tendon tearing. Punctate fluid bright signal at the posterior infraspinatus musculotendinous junction and proximal tendon footprint, a small partial-thickness tear (sagittal series 7 images 15 through 19 and coronal series 5 image 7). Diffuse intermediate T2 signal suggesting moderate subscapularis tendinosis. The teres minor appears intact. Muscles: Within the limitations of motion artifact there appears to be moderate supraspinatus and anterior infraspinatus muscle atrophy and fatty infiltration. Biceps long head: The intra-articular long head of the biceps tendon is not well visualized. The tendon is seen within the distal aspect of the bicipital groove (axial images 21-23) but not the proximal aspect of the bicipital groove. Findings are suspicious for proximal tendon rupture with distal tendon retraction. Acromioclavicular Joint: There are mild-to-moderate degenerative changes of the acromioclavicular joint including joint space narrowing, clavicular head subchondral marrow edema, and peripheral osteophytosis. Type II acromion. Glenohumeral Joint: Moderate glenoid and humeral head  cartilage thinning. Moderate anterior inferior greater than posteroinferior glenoid subchondral marrow edema and cystic change. Likely mild chronic superomedial humeral head cortical flattening/remodeling degenerative change. Labrum: Motion artifact limits evaluation. Likely attenuation and degenerative fraying of the superior glenoid labrum. Bones: Mild marrow edema within the greater tuberosity deep to the supraspinatus tendon insertion. There is tubular and serpiginous decreased T1 and heterogeneous T2 signal seen within the proximal diaphysis of the humerus. This corresponds to serpiginous sclerotic and lucent signal on prior  10/13/2018 radiographs. This appears to represent a chronic bone infarct. No cortical destruction or extraosseous soft tissue mass. Other: None. IMPRESSION: 1. Unfortunately, moderate to high-grade patient motion artifact technically limits this study. 2. Massive full-thickness tear of the entire AP dimension of the supraspinatus tendon footprint. Moderate supraspinatus muscle atrophy. 3. Likely anterior infraspinatus partial-thickness tendon tearing. Mild partial-thickness tear within the posterior infraspinatus musculotendinous junction and proximal tendon. 4. Moderate subscapularis tendinosis. 5. Mild-to-moderate degenerative changes of the acromioclavicular and glenohumeral joints. 6. 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 Electronically Signed   By: Yvonne Kendall M.D.   On: 09/17/2021 12:36   MR SHOULDER LEFT WO CONTRAST  Result Date: 09/17/2021 CLINICAL DATA:  Shoulder pain. Rotator cuff disorder suspected. Evaluate for metastases. EXAM: MRI OF THE LEFT SHOULDER WITHOUT CONTRAST TECHNIQUE: Multiplanar, multisequence MR imaging of the shoulder was performed. No intravenous contrast was administered. COMPARISON:  Left shoulder radiographs 09/16/2021 FINDINGS: Despite efforts by the technologist and patient, motion artifact is present on today's exam and could not be eliminated. This reduces exam sensitivity and specificity. Rotator cuff: There is horizontal linear fluid bright signal indicating a high-grade partial-thickness bursal sided tear of the supraspinatus tendon footprint measuring up to 11 mm in transverse dimension (coronal series 4 images thirteen through 15) and 16 mm in AP dimension (sagittal series 6, images 18 and 19). Only minimal approximate 5 mm tendon retraction of the bursal sided fibers. The infraspinatus is intact. Minimal superior subscapularis intermediate T2 signal tendinosis. The teres minor is intact. Muscles: No rotator cuff muscle atrophy, fatty infiltration,  or edema. Biceps long head: The intra-articular long head of the biceps tendon is intact. Acromioclavicular Joint: There are mild-to-moderate degenerative changes of the acromioclavicular joint including joint space narrowing, clavicular head subchondral marrow edema, and peripheral osteophytosis. Type II acromion. Mild subacromial/subdeltoid bursitis. Glenohumeral Joint: Moderate thinning of the glenoid and humeral head cartilage. Labrum: Grossly intact, but evaluation is limited by lack of intraarticular fluid. Bones:  No acute fracture. Other: Prominent anterior shoulder blood vessels. Note is made of a stent overlying the left axilla on prior radiographs. IMPRESSION: 1. Horizontal linear high-grade partial-thickness bursal sided tear of the anterior supraspinatus tendon footprint measuring up to 16 mm in AP dimension. 2. Mild-to-moderate degenerative changes of the acromioclavicular joint. 3. Moderate thinning of the glenoid and humeral head cartilage. Electronically Signed   By: Yvonne Kendall M.D.   On: 09/17/2021 12:23   DG Chest Port 1 View  Result Date: 09/28/2021 CLINICAL DATA:  61 year old male with history of weakness, shoulder pain and dark stools since yesterday. EXAM: PORTABLE CHEST 1 VIEW COMPARISON:  Chest x-ray 09/15/2021. FINDINGS: Lung volumes are low. Areas of interstitial prominence and peribronchial cuffing are noted in the lungs bilaterally, most severe throughout the left mid to lower lung. No consolidative airspace disease. No pleural effusions. No pneumothorax. No pulmonary nodule or mass noted. Pulmonary vasculature is normal. Heart size is mildly enlarged (unchanged). Upper mediastinal contours are within normal limits. Atherosclerotic calcifications are  noted in the thoracic aorta. IMPRESSION: 1. Findings are concerning for acute bronchitis, potentially with developing bronchopneumonia in the left lower lobe. 2. Aortic atherosclerosis. Electronically Signed   By: Vinnie Langton M.D.    On: 09/28/2021 07:59   DG CHEST PORT 1 VIEW  Result Date: 09/15/2021 CLINICAL DATA:  Shortness of breath EXAM: PORTABLE CHEST 1 VIEW COMPARISON:  09/14/2021 FINDINGS: Stable cardiomegaly. Aortic atherosclerosis. Mild pulmonary vascular congestion. Mildly increased perihilar and bibasilar interstitial markings bilaterally. No large pleural fluid collection. No pneumothorax. IMPRESSION: Findings suggestive of CHF with mild interstitial edema. Electronically Signed   By: Davina Poke D.O.   On: 09/15/2021 18:46   DG Chest Portable 1 View  Result Date: 09/14/2021 CLINICAL DATA:  Shortness of breath EXAM: PORTABLE CHEST 1 VIEW COMPARISON:  08/16/2021 FINDINGS: Transverse diameter of heart is increased. Central pulmonary vessels are prominent. Increased interstitial markings are seen in the parahilar regions and lower lung fields. There is hazy homogeneous opacity in the right parahilar region. Lateral CP angles are indistinct. There is no pneumothorax. There is vascular stent in the left axilla. IMPRESSION: Cardiomegaly. Central pulmonary vessels are prominent suggesting possible mild CHF. Increased interstitial markings are seen in the parahilar regions and lower lung fields suggesting mild interstitial edema or interstitial pneumonia. There is hazy homogeneous opacity in the right parahilar region which may suggest loculated effusion in the interlobar fissure or subsegmental atelectasis. Electronically Signed   By: Elmer Picker M.D.   On: 09/14/2021 12:52   DG Shoulder Left  Result Date: 09/16/2021 CLINICAL DATA:  Left shoulder pain no known trauma. EXAM: LEFT SHOULDER - 2+ VIEW COMPARISON:  None Available. FINDINGS: There is no evidence of fracture or dislocation. Mild degenerative change of the glenohumeral and acromioclavicular joints. Mineralization of the supraspinatus tendon. Vascular stent projects over the left axilla. Vascular calcifications. IMPRESSION: 1. No acute fracture or  dislocation. 2. Mild degenerative changes of the glenohumeral and acromioclavicular joints. 3. Calcific tendinosis of the supraspinatus tendon. Electronically Signed   By: Dahlia Bailiff M.D.   On: 09/16/2021 16:21   VAS [place] LOWER EXTREMITY VENOUS (DVT)  Result Date: 09/29/2021  Lower Venous DVT Study Patient Name:  ALONTE WULFF  Date of Exam:   09/28/2021 Medical Rec #: [id number]           Accession #:    [id number] Date of Birth: 1960-07-13           Patient Gender: M Patient Age:   61 years Exam Location:  [facility] Procedure:      VAS [place] LOWER EXTREMITY VENOUS (DVT) Referring Phys: Anderson Malta YATES --------------------------------------------------------------------------------  Indications: Left lower extremity swelling.  Risk Factors: History of left BKA. Limitations: Patient unable to tolerate compression maneuvers. Comparison Study: 04/14/2021- negative left lower extremity venous duplex Performing Technologist: Sharion Dove RVS  Examination Guidelines: A complete evaluation includes B-mode imaging, spectral Doppler, color Doppler, and power Doppler as needed of all accessible portions of each vessel. Bilateral testing is considered an integral part of a complete examination. Limited examinations for reoccurring indications may be performed as noted. The reflux portion of the exam is performed with the patient in reverse Trendelenburg.  +-----+---------------+---------+-----------+----------+--------------+ RIGHTCompressibilityPhasicitySpontaneityPropertiesThrombus Aging +-----+---------------+---------+-----------+----------+--------------+ CFV  Full           Yes      Yes                                 +-----+---------------+---------+-----------+----------+--------------+   +---------+---------------+---------+-----------+----------+--------------+  LEFT     CompressibilityPhasicitySpontaneityPropertiesThrombus Aging  +---------+---------------+---------+-----------+----------+--------------+ CFV                     Yes      Yes        patent                   +---------+---------------+---------+-----------+----------+--------------+ SFJ                     Yes      Yes        patent                   +---------+---------------+---------+-----------+----------+--------------+ FV Prox                 Yes      Yes        patent                   +---------+---------------+---------+-----------+----------+--------------+ FV Mid                  Yes      Yes        patent                   +---------+---------------+---------+-----------+----------+--------------+ FV Distal               Yes      Yes        patent                   +---------+---------------+---------+-----------+----------+--------------+ PFV                     Yes      Yes        patent                   +---------+---------------+---------+-----------+----------+--------------+ POP                     Yes      Yes        patent                   +---------+---------------+---------+-----------+----------+--------------+    Summary: RIGHT: - No evidence of common femoral vein obstruction.  LEFT: - No cystic structure found in the popliteal fossa. - No evidence of deep vein thrombosis by color and pulsed wave Doppler involving left lower extremity veins.  *See table(s) above for measurements and observations. Electronically signed by Deitra Mayo MD on 09/29/2021 at 6:31:07 AM.    Final    IR Radiologist Eval & Mgmt  Result Date: 09/12/2021 Please refer to notes tab for details about interventional procedure. (Op Note)   Labs: BNP (last 3 results) Recent Labs    01/05/21 0505 01/06/21 1804 06/14/21 0356  BNP 87.9 193.9* 458.0*   Basic Metabolic Panel: Recent Labs  Lab 09/26/21 1436 09/28/21 0729 09/28/21 2240  NA 137 140 134*  K 4.3 4.6 5.0  CL 97* 98 93*  CO2 '25 26 24  '$ GLUCOSE 153*  132* 87  BUN 45* 28* 36*  CREATININE 7.78* 5.88* 6.46*  CALCIUM 9.2 9.1 8.9  MG  --   --  1.9   Liver Function Tests: Recent Labs  Lab 09/26/21 1436 09/28/21 0729  AST 34 43*  ALT 11 16  ALKPHOS 118 126  BILITOT 0.6 0.7  PROT 7.6 8.4*  ALBUMIN 3.0* 2.5*  No results for input(s): LIPASE, AMYLASE in the last 168 hours. No results for input(s): AMMONIA in the last 168 hours. CBC: Recent Labs  Lab 09/26/21 1436 09/28/21 0729 09/28/21 2014 09/29/21 0454  WBC 4.5 4.8 5.6 5.8  NEUTROABS 2.8  --   --   --   HGB 7.8* 6.9* 10.1* 11.3*  HCT 23.0* 20.8* 29.0* 31.1*  MCV 99.6 101.0* 94.2 93.7  PLT 125* 153 130* 132*   Cardiac Enzymes: No results for input(s): CKTOTAL, CKMB, CKMBINDEX, TROPONINI in the last 168 hours. BNP: Invalid input(s): POCBNP CBG: No results for input(s): GLUCAP in the last 168 hours. D-Dimer No results for input(s): DDIMER in the last 72 hours. Hgb A1c No results for input(s): HGBA1C in the last 72 hours. Lipid Profile No results for input(s): CHOL, HDL, LDLCALC, TRIG, CHOLHDL, LDLDIRECT in the last 72 hours. Thyroid function studies No results for input(s): TSH, T4TOTAL, T3FREE, THYROIDAB in the last 72 hours.  Invalid input(s): FREET3 Anemia work up Recent Labs    09/26/21 1436  FERRITIN 205   Urinalysis    Component Value Date/Time   COLORURINE YELLOW 07/31/2019 2128   APPEARANCEUR CLEAR 07/31/2019 2128   LABSPEC 1.009 07/31/2019 2128   PHURINE 5.0 07/31/2019 2128   GLUCOSEU NEGATIVE 07/31/2019 2128   HGBUR SMALL (A) 07/31/2019 2128   BILIRUBINUR NEGATIVE 07/31/2019 2128   KETONESUR NEGATIVE 07/31/2019 2128   PROTEINUR 100 (A) 09/03/2021 1150   UROBILINOGEN 0.2 12/31/2009 0746   NITRITE NEGATIVE 07/31/2019 2128   LEUKOCYTESUR NEGATIVE 07/31/2019 2128   Sepsis Labs Invalid input(s): PROCALCITONIN,  WBC,  LACTICIDVEN Microbiology No results found for this or any previous visit (from the past 240 hour(s)). Time coordinating  discharge: 25 minutes  SIGNED: Antonieta Pert, MD  [facility] 09/29/2021, 1:49 PM  If 7PM-7AM, please contact night-coverage [email or web address]

## 2021-09-29 NOTE — TOC Transition Note (Signed)
Transition of Care [facility]) - CM/SW Discharge Note   Patient Details  Name: Daniel [name] MRN: [id number] Date of Birth: 1961-02-15  Transition of Care [facility]) CM/SW Contact:  Bartholomew Crews, RN Phone Number: [phone] 09/29/2021, 12:52 PM   Clinical Narrative:     Spoke with patient on hospital room phone to discuss post acute transition. Patient stated that he is feeling better and going home. Reported that he will arrange transportation home. No further TOC needs identified at one time.   Final next level of care: Home/Self Care Barriers to Discharge: No Barriers Identified   Patient Goals and CMS Choice Patient states their goals for this hospitalization and ongoing recovery are:: going home CMS Medicare.gov Compare Post Acute Care list provided to:: Patient Choice offered to / list presented to : NA  Discharge Placement                       Discharge Plan and Services                DME Arranged: N/A DME Agency: NA       HH Arranged: NA HH Agency: NA        Social Determinants of Health (SDOH) Interventions     Readmission Risk Interventions    02/27/2021   12:51 PM 07/07/2019    4:03 PM  Readmission Risk Prevention Plan  Transportation Screening Complete Complete  PCP or Specialist Appt within 3-5 Days  Complete  HRI or [place]  Complete  Social Work Consult for [place] Planning/Counseling  Complete  Palliative Care Screening  Not Applicable  Medication Review Press photographer) Complete Complete  PCP or Specialist appointment within 3-5 days of discharge Complete   HRI or [place] Complete   SW Recovery Care/Counseling Consult Complete   [place] Not Applicable

## 2021-09-29 NOTE — Hospital Course (Addendum)
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 In ED: Lab work showed severe anemia hemoglobin 6.9 g heme-negative GI consulted and admitted. Patient was monitored additional night after transfusion hemoglobin has significantly improved appropriately, seen by vascular no concern for infection.  Patient is alert awake oriented x3.  He had some chronic shoulder pain but stable and will be discharged home once tolerating diet with follow-up for dialysis tomorrow and  CBC check  from PCP in a week.  Follow-up chest x-ray with Pcp in 3 weeks.

## 2021-09-30 ENCOUNTER — Telehealth: Payer: Self-pay

## 2021-09-30 LAB — TYPE AND SCREEN
ABO/RH(D): B POS
Antibody Screen: NEGATIVE
Unit division: 0

## 2021-09-30 LAB — BPAM RBC
Blood Product Expiration Date: 202307022359
Unit Type and Rh: 7300

## 2021-09-30 NOTE — Telephone Encounter (Signed)
Transition Care Management Follow-up Telephone Call Date of discharge and from where: 09/29/2021, [facility]  How have you been since you were released from the hospital? He stated he is feeling okay Any questions or concerns? No  Items Reviewed: Did the pt receive and understand the discharge instructions provided? Yes  Medications obtained and verified?  He needs to pick up the new medications today.  He said he has everything else and did not have any questions about the med regime  Other? No  Any new allergies since your discharge? No  Dietary orders reviewed? No Do you have support at home?  He lives in a [place] and cares for himself.    Home Care and Equipment/Supplies: Were home health services ordered? no If so, what is the name of the agency? N/a  Has the agency set up a time to come to the patient's home? not applicable Were any new equipment or medical supplies ordered?  No What is the name of the medical supply agency? N/a Were you able to get the supplies/equipment? not applicable Do you have any questions related to the use of the equipment or supplies? No  Functional Questionnaire: (I = Independent and D = Dependent) ADLs: Uses wheelchair for mobility. Independent with personal care.  Attends HD: M/W/F at [facility].   He said he never heard from [place] about swapping out his wheelchair for a smaller one.  I called [place] again and spoke to Ideal. She explained that she is not sure what happened and will re-submit the order to have the wheelchair exchanged.  She confirmed the patient's phone number.    Follow up appointments reviewed:  PCP Hospital f/u appt confirmed? Yes  Scheduled to see [name], NP - 10/01/2021.  [facility] f/u appt confirmed? Yes  Scheduled to see oncology - 10/10/2021.  Are transportation arrangements needed? No  If their condition worsens, is the pt aware to call PCP or go to the Emergency Dept.?  Yes Was the patient provided with contact information for the PCP's office or ED? Yes Was to pt encouraged to call back with questions or concerns? Yes

## 2021-10-01 ENCOUNTER — Ambulatory Visit (INDEPENDENT_AMBULATORY_CARE_PROVIDER_SITE_OTHER): Payer: Medicare Other | Admitting: Primary Care

## 2021-10-01 ENCOUNTER — Encounter (INDEPENDENT_AMBULATORY_CARE_PROVIDER_SITE_OTHER): Payer: Self-pay

## 2021-10-01 ENCOUNTER — Other Ambulatory Visit: Payer: Self-pay | Admitting: *Deleted

## 2021-10-01 NOTE — Patient Outreach (Signed)
[place] [facility]) Care Management  10/01/2021  Daniel [name] 1961/01/20 [id number]  [facility] Outreach, unsuccessful, unable to leave a message as his mailbox is full.   [name], pt sister, texted me on Friday and requested a return text which I did today and requested permission to call. No immediate response.  Note pt was hospitalized for GI bleed again. Notified from oncology provider that his hgb was low and he needed to report to the ED for assessment and transfusions.  He has an appt with his PCP today.  [name], MSN, GNP-BC Gerontological Nurse Practitioner [facility] Care Management [phone]  Called pt again and was able to talk with him. [place] [facility]) Care Management Geriatric Nurse Practitioner Note   10/02/2021 Name:  Daniel [name] MRN:  [id number] DOB:  [birth year]  Summary: VERY POOR HEALTH RELATED TO ESKD AND NEW DX LIVER CA.  Recommendations/Changes made from today's visit: Cooperate with NP and sister to complete forms for handicapped housing!  Subjective: Daniel [name] is an 61 y.o. year old male who is a primary patient of [name], NP. The care management team was consulted for assistance with care management and/or care coordination needs.    Geriatric Nurse Practitioner completed Telephone Visit today.   Objective:  Medications Reviewed Today     Reviewed by Deloria Lair, NP (Nurse Practitioner) on 10/01/21 at 1312  Med List Status: <None>   Medication Order Taking? Sig Documenting Provider Last Dose Status Informant  amLODipine (NORVASC) 5 MG tablet [id number] No Take 1 tablet (5 mg total) by mouth daily. [place] Eric J, DO 09/27/2021 Active   aspirin EC 81 MG tablet [id number] No Take 1 tablet (81 mg total) by mouth daily. [place] Eric J, DO 09/27/2021 Active   atorvastatin (LIPITOR) 40 MG tablet [id number] No Take 1 tablet (40 mg total) by mouth daily. [place] Eric J, DO 09/27/2021 Active    calcium acetate, Phos Binder, (PHOSLYRA) 667 MG/5ML SOLN [id number] No Take 5 mLs (667 mg total) by mouth 3 (three) times daily with meals. [place] Donnamarie Poag, DO unk Active   cefdinir (OMNICEF) 300 MG capsule [id number]  Take 1 capsule (300 mg total) by mouth every Monday, Wednesday, and Friday for 3 doses. Take after dialysis Antonieta Pert, MD  Active   Discontinued 07/29/20 1032 Darbepoetin Alfa (ARANESP) 100 MCG/0.5ML SOSY injection [id number] No Inject 0.5 mLs (100 mcg total) into the vein every 7 (seven) days for 8 doses.  Patient not taking: Reported on 09/15/2021   Bonnell Public, MD Not Taking Active Self           Med Note (COFFELL, Dionne Bucy   Sun Sep 15, 2021  1:33 PM) Pt said he does not get this medication.  doxycycline (ADOXA) 100 MG tablet [id number]  Take 1 tablet (100 mg total) by mouth 2 (two) times daily for 5 days. Antonieta Pert, MD  Active   Discontinued 02/09/20 1843          Med Note Ebony Hail, AMBER B   Mon Oct 17, 2019  7:53 AM)    gabapentin (NEURONTIN) 100 MG capsule [id number] No Take 1 capsule (100 mg total) by mouth 3 (three) times daily. [place] Eric J, DO 09/27/2021 Active   ivabradine (CORLANOR) 5 MG TABS tablet [id number] No Take 1 tablet (5 mg total) by mouth 2 (two) times daily with a meal. Take 2 Tablets 90-120 minutes prior to Scan. Warren Lacy, PA-C unk Active   methocarbamol (  ROBAXIN) 500 MG tablet [id number] No Take 1 tablet (500 mg total) by mouth every 6 (six) hours as needed for muscle spasms. Bonnielee Haff, MD unk Active   metoprolol tartrate (LOPRESSOR) 100 MG tablet [id number] No Take 1 tablet (100 mg total) by mouth 2 (two) times daily. Take 1 Tablet 90-120 minutes prior to scan. Warren Lacy, PA-C unk Active   oxyCODONE-acetaminophen (PERCOCET) 5-325 MG tablet [id number] No Take 1 tablet by mouth every 6 (six) hours as needed. Milton Ferguson, MD unk Active   pantoprazole (PROTONIX) 40 MG tablet [id number] No Take 1 tablet (40 mg total) by mouth 2  (two) times daily before a meal. [place] Donnamarie Poag, DO unk Active   traZODone (DESYREL) 50 MG tablet [id number] No Take 1 tablet (50 mg total) by mouth at bedtime as needed for sleep. [place] Donnamarie Poag, DO unk Active              SDOH:  (Social Determinants of Health) assessments and interventions performed:    Care Plan  Review of patient past medical history, allergies, medications, health status, including review of consultants reports, laboratory and other test data, was performed as part of comprehensive evaluation for care management services.   Care Plan : [facility] NP Plan of Care  Updates made by Deloria Lair, NP since 10/02/2021 12:00 AM     Problem: Needs handicapped accessible living quarters.   Priority: High  Onset Date: 08/29/2021     Goal: Patient will review and fill out applications for appropriate housing over the next 30 days. Extending end date and working of this goal to Pt will assist with proving information for applications.   Start Date: 08/29/2021  Expected End Date: 11/01/2021  This Visit's Progress: Not on track  Recent Progress: Not on track  Priority: High  Note:    Update 10/01/21:  (Status: Goal on track: NO.) Short Term Goal  Evaluation of current treatment plan related to HOUSING APPLICATIONS and patient's adherence to plan as established by provider Pt has not done any applications, He has been in the hospital again for GI bleed and anemia requiring transfusions. He is weak. He goes to dialysis 3 times a week and is now having to go to oncology due to his anemia and discovery of liver ca.      Pt's sister [name] called NP yesterday. Pt had not mentioned her as a contact. He always says no one wants to help him. Pt gave permission for NP to talk with his sister. She and NP had conversation today regarding his health status and his nnot trying to help himself find new housing. NP advised she is filling out a [facility] application and requests information for  questions. Sister will assist with this and will go to several housing communities to apply in person on behalf of his brother. She works 12 hour shifts 2-2 and can help some before she goes to work.  Update 09/19/21:  (Status: Goal on track: NO.) Short Term Goal  Evaluation of current treatment plan related to [place] and patient's adherence to plan as established by provider Pt just discharged from the hospital for 4 day stay anemia requiring 3 pints of blood. 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 He goes to dialysis M-W-F. He has had numerous other MD appts. Talked with him today about his progress in seeking housing and he said he can't do that because he cannot get in and out of his car without assistance. He is  a BKA and uses a W/C. NP requested that he call the housing areas that he would like to live in and make and appt to go fill out an application and view the apts. Suggested he could make several appts on the same day and enlist a family member or friend to go with him to assist. He was not happy with the suggestion but he said OK. He says he is supposed to be out of his current housing situation on 09/28/21. He had known about this for some time now and had been provided the information a month ago for available housing options. NP will call LCSW on suggestions for housing that are likely to have openings for quick move in date.  Current Barriers:  Self motivation  RNCM Clinical Goal(s):  Patient will work with care team [facility] NP, [name] CSW, potential housing locations) to choose appropriate housing and complete applications as evidenced by pt report   through collaboration with Consulting civil engineer, provider, and care team.   Interventions: Inter-disciplinary care team collaboration (see longitudinal plan of care) Evaluation of current treatment plan related to  self management and patient's adherence to plan as established by provider  Patient Goals/Self-Care Activities: Initiate process of  finding new housing with resources given.      Problem: Possible new dx of liver and lung cancer   Priority: Medium  Onset Date: 08/29/2021     Long-Range Goal: Patient will attend oncology appts over the next 30 days. Extending goal for another 60 days and changed to long term goal.   Start Date: 08/29/2021  Expected End Date: 09/30/2021  Priority: Medium  Note:    Update 10/01/21:  (Status: Goal on Track (progressing): YES.) Long Term Goal  Evaluation of current treatment plan related to ATTENDING ONCOLOGY APPTS and patient's adherence to plan as established by provider Pt is attending these appts. He is needing frequent transfusions due to anemia related to blood loss and to monitor his newly dx liver ca. Praised for him making his appts. Discussed how important it is to have labs drawn to monitor his blood count because if it falls too low it could be life threatening.  Update 09/19/21:  (Status: Goal on track: NO.) Short Term Goal  Evaluation of current treatment plan related to [place] and patient's adherence to plan as established by provider Pt has had one chemotherapy infusion,however, he denies having had any. He did get a blood transfusion the same day so that may have been confusing for him. He does have an appt on 09/26/21, 10/03/21, 10/10/21 and an Interventional Radiaology appt on 10/24/21 for a procedure. NP to follow and remind pt's of his appts.  Current Barriers:  Knowledge Deficits related to plan of care for management of possible new ca.  Chronic Disease Management support and education needs related to possible ca No Advanced Directives in place  RNCM Clinical Goal(s):  Patient will verbalize understanding of plan for management of possible ca as evidenced by pt report. attend all scheduled medical appointments: Oncology as evidenced by chart review        through collaboration with RN Care manager, provider, and care team.    Interventions: Inter-disciplinary care team collaboration (see longitudinal plan of care) Evaluation of current treatment plan related to  self management and patient's adherence to plan as established by provider  Patient Goals/Self-Care Activities: Attend all scheduled provider appointments        Plan: Telephone follow up appointment with care  management team member scheduled for:  one week.  Pt agrees to the plan of care.  [name], MSN, [facility] Gerontological Nurse Practitioner [facility] Care Management [phone]

## 2021-10-03 ENCOUNTER — Inpatient Hospital Stay: Payer: Medicare Other

## 2021-10-03 ENCOUNTER — Other Ambulatory Visit: Payer: Self-pay

## 2021-10-03 ENCOUNTER — Telehealth: Payer: Self-pay

## 2021-10-03 DIAGNOSIS — C220 Liver cell carcinoma: Secondary | ICD-10-CM

## 2021-10-03 DIAGNOSIS — K31811 Angiodysplasia of stomach and duodenum with bleeding: Secondary | ICD-10-CM | POA: Diagnosis not present

## 2021-10-03 DIAGNOSIS — K625 Hemorrhage of anus and rectum: Secondary | ICD-10-CM | POA: Diagnosis not present

## 2021-10-03 LAB — CBC WITH DIFFERENTIAL (CANCER CENTER ONLY)
Abs Immature Granulocytes: 0.02 10*3/uL (ref 0.00–0.07)
Basophils Absolute: 0 10*3/uL (ref 0.0–0.1)
Basophils Relative: 0 %
Eosinophils Absolute: 0.1 10*3/uL (ref 0.0–0.5)
Eosinophils Relative: 2 %
HCT: 26.7 % — ABNORMAL LOW (ref 39.0–52.0)
Hemoglobin: 9 g/dL — ABNORMAL LOW (ref 13.0–17.0)
Immature Granulocytes: 0 %
Lymphocytes Relative: 29 %
Lymphs Abs: 1.5 10*3/uL (ref 0.7–4.0)
MCH: 32.8 pg (ref 26.0–34.0)
MCHC: 33.7 g/dL (ref 30.0–36.0)
MCV: 97.4 fL (ref 80.0–100.0)
Monocytes Absolute: 0.7 10*3/uL (ref 0.1–1.0)
Monocytes Relative: 13 %
Neutro Abs: 2.9 10*3/uL (ref 1.7–7.7)
Neutrophils Relative %: 56 %
Platelet Count: 142 10*3/uL — ABNORMAL LOW (ref 150–400)
RBC: 2.74 MIL/uL — ABNORMAL LOW (ref 4.22–5.81)
RDW: 19.3 % — ABNORMAL HIGH (ref 11.5–15.5)
WBC Count: 5.3 10*3/uL (ref 4.0–10.5)
nRBC: 0 % (ref 0.0–0.2)

## 2021-10-03 LAB — CMP (CANCER CENTER ONLY)
ALT: 11 U/L (ref 0–44)
AST: 38 U/L (ref 15–41)
Albumin: 3.2 g/dL — ABNORMAL LOW (ref 3.5–5.0)
Alkaline Phosphatase: 144 U/L — ABNORMAL HIGH (ref 38–126)
Anion gap: 19 — ABNORMAL HIGH (ref 5–15)
BUN: 46 mg/dL — ABNORMAL HIGH (ref 8–23)
CO2: 22 mmol/L (ref 22–32)
Calcium: 9.7 mg/dL (ref 8.9–10.3)
Chloride: 96 mmol/L — ABNORMAL LOW (ref 98–111)
Creatinine: 8.09 mg/dL (ref 0.61–1.24)
GFR, Estimated: 7 mL/min — ABNORMAL LOW (ref >60)
Glucose, Bld: 97 mg/dL (ref 70–99)
Potassium: 4.6 mmol/L (ref 3.5–5.1)
Sodium: 137 mmol/L (ref 135–145)
Total Bilirubin: 0.7 mg/dL (ref 0.3–1.2)
Total Protein: 8.3 g/dL — ABNORMAL HIGH (ref 6.5–8.1)

## 2021-10-03 NOTE — Telephone Encounter (Signed)
Lab called reporting critical Scr+ 8.09 lab value.  Pt is a dialysis pt.  Notified covering providers.

## 2021-10-05 ENCOUNTER — Other Ambulatory Visit: Payer: Self-pay

## 2021-10-05 ENCOUNTER — Encounter (HOSPITAL_COMMUNITY): Payer: Self-pay | Admitting: Emergency Medicine

## 2021-10-05 ENCOUNTER — Inpatient Hospital Stay (HOSPITAL_COMMUNITY): Payer: Medicare Other

## 2021-10-05 ENCOUNTER — Inpatient Hospital Stay (HOSPITAL_COMMUNITY)
Admission: EM | Admit: 2021-10-05 | Discharge: 2021-10-10 | DRG: 377 | Disposition: A | Discharge status: Home Health | Payer: Medicare Other | Attending: Student | Admitting: Student

## 2021-10-05 DIAGNOSIS — R778 Other specified abnormalities of plasma proteins: Secondary | ICD-10-CM | POA: Diagnosis not present

## 2021-10-05 DIAGNOSIS — I5032 Chronic diastolic (congestive) heart failure: Secondary | ICD-10-CM

## 2021-10-05 DIAGNOSIS — Q2739 Arteriovenous malformation, other site: Secondary | ICD-10-CM

## 2021-10-05 DIAGNOSIS — Z885 Allergy status to narcotic agent status: Secondary | ICD-10-CM

## 2021-10-05 DIAGNOSIS — F17210 Nicotine dependence, cigarettes, uncomplicated: Secondary | ICD-10-CM | POA: Diagnosis present

## 2021-10-05 DIAGNOSIS — M25512 Pain in left shoulder: Secondary | ICD-10-CM

## 2021-10-05 DIAGNOSIS — R5381 Other malaise: Secondary | ICD-10-CM

## 2021-10-05 DIAGNOSIS — I10 Essential (primary) hypertension: Secondary | ICD-10-CM | POA: Diagnosis not present

## 2021-10-05 DIAGNOSIS — E875 Hyperkalemia: Secondary | ICD-10-CM | POA: Diagnosis present

## 2021-10-05 DIAGNOSIS — I248 Other forms of acute ischemic heart disease: Secondary | ICD-10-CM | POA: Diagnosis present

## 2021-10-05 DIAGNOSIS — K317 Polyp of stomach and duodenum: Secondary | ICD-10-CM | POA: Diagnosis present

## 2021-10-05 DIAGNOSIS — M75101 Unspecified rotator cuff tear or rupture of right shoulder, not specified as traumatic: Secondary | ICD-10-CM | POA: Diagnosis present

## 2021-10-05 DIAGNOSIS — Z992 Dependence on renal dialysis: Secondary | ICD-10-CM | POA: Diagnosis not present

## 2021-10-05 DIAGNOSIS — N200 Calculus of kidney: Secondary | ICD-10-CM | POA: Diagnosis present

## 2021-10-05 DIAGNOSIS — K7460 Unspecified cirrhosis of liver: Secondary | ICD-10-CM | POA: Diagnosis present

## 2021-10-05 DIAGNOSIS — M75100 Unspecified rotator cuff tear or rupture of unspecified shoulder, not specified as traumatic: Secondary | ICD-10-CM

## 2021-10-05 DIAGNOSIS — K3189 Other diseases of stomach and duodenum: Secondary | ICD-10-CM | POA: Diagnosis present

## 2021-10-05 DIAGNOSIS — R4189 Other symptoms and signs involving cognitive functions and awareness: Secondary | ICD-10-CM

## 2021-10-05 DIAGNOSIS — Z888 Allergy status to other drugs, medicaments and biological substances status: Secondary | ICD-10-CM

## 2021-10-05 DIAGNOSIS — Z6830 Body mass index (BMI) 30.0-30.9, adult: Secondary | ICD-10-CM

## 2021-10-05 DIAGNOSIS — K922 Gastrointestinal hemorrhage, unspecified: Secondary | ICD-10-CM | POA: Diagnosis not present

## 2021-10-05 DIAGNOSIS — K2971 Gastritis, unspecified, with bleeding: Secondary | ICD-10-CM | POA: Diagnosis present

## 2021-10-05 DIAGNOSIS — I5033 Acute on chronic diastolic (congestive) heart failure: Secondary | ICD-10-CM | POA: Diagnosis not present

## 2021-10-05 DIAGNOSIS — D631 Anemia in chronic kidney disease: Secondary | ICD-10-CM | POA: Diagnosis present

## 2021-10-05 DIAGNOSIS — D649 Anemia, unspecified: Secondary | ICD-10-CM

## 2021-10-05 DIAGNOSIS — E0781 Sick-euthyroid syndrome: Secondary | ICD-10-CM

## 2021-10-05 DIAGNOSIS — R9431 Abnormal electrocardiogram [ECG] [EKG]: Secondary | ICD-10-CM | POA: Diagnosis present

## 2021-10-05 DIAGNOSIS — K766 Portal hypertension: Secondary | ICD-10-CM | POA: Diagnosis present

## 2021-10-05 DIAGNOSIS — Z7982 Long term (current) use of aspirin: Secondary | ICD-10-CM

## 2021-10-05 DIAGNOSIS — C220 Liver cell carcinoma: Secondary | ICD-10-CM | POA: Diagnosis present

## 2021-10-05 DIAGNOSIS — K635 Polyp of colon: Secondary | ICD-10-CM

## 2021-10-05 DIAGNOSIS — Z8701 Personal history of pneumonia (recurrent): Secondary | ICD-10-CM

## 2021-10-05 DIAGNOSIS — R1032 Left lower quadrant pain: Secondary | ICD-10-CM

## 2021-10-05 DIAGNOSIS — M25511 Pain in right shoulder: Secondary | ICD-10-CM | POA: Diagnosis not present

## 2021-10-05 DIAGNOSIS — Z993 Dependence on wheelchair: Secondary | ICD-10-CM

## 2021-10-05 DIAGNOSIS — K5521 Angiodysplasia of colon with hemorrhage: Secondary | ICD-10-CM | POA: Diagnosis not present

## 2021-10-05 DIAGNOSIS — D62 Acute posthemorrhagic anemia: Secondary | ICD-10-CM | POA: Diagnosis not present

## 2021-10-05 DIAGNOSIS — K31819 Angiodysplasia of stomach and duodenum without bleeding: Secondary | ICD-10-CM | POA: Diagnosis not present

## 2021-10-05 DIAGNOSIS — Z8673 Personal history of transient ischemic attack (TIA), and cerebral infarction without residual deficits: Secondary | ICD-10-CM

## 2021-10-05 DIAGNOSIS — K921 Melena: Secondary | ICD-10-CM | POA: Diagnosis present

## 2021-10-05 DIAGNOSIS — E1122 Type 2 diabetes mellitus with diabetic chronic kidney disease: Secondary | ICD-10-CM | POA: Diagnosis present

## 2021-10-05 DIAGNOSIS — M75102 Unspecified rotator cuff tear or rupture of left shoulder, not specified as traumatic: Secondary | ICD-10-CM | POA: Diagnosis present

## 2021-10-05 DIAGNOSIS — B182 Chronic viral hepatitis C: Secondary | ICD-10-CM | POA: Diagnosis present

## 2021-10-05 DIAGNOSIS — K625 Hemorrhage of anus and rectum: Principal | ICD-10-CM

## 2021-10-05 DIAGNOSIS — N2581 Secondary hyperparathyroidism of renal origin: Secondary | ICD-10-CM | POA: Diagnosis present

## 2021-10-05 DIAGNOSIS — Z89512 Acquired absence of left leg below knee: Secondary | ICD-10-CM

## 2021-10-05 DIAGNOSIS — R195 Other fecal abnormalities: Secondary | ICD-10-CM | POA: Diagnosis not present

## 2021-10-05 DIAGNOSIS — R040 Epistaxis: Secondary | ICD-10-CM | POA: Diagnosis present

## 2021-10-05 DIAGNOSIS — D696 Thrombocytopenia, unspecified: Secondary | ICD-10-CM | POA: Diagnosis present

## 2021-10-05 DIAGNOSIS — Z79899 Other long term (current) drug therapy: Secondary | ICD-10-CM

## 2021-10-05 DIAGNOSIS — E119 Type 2 diabetes mellitus without complications: Secondary | ICD-10-CM

## 2021-10-05 DIAGNOSIS — K5520 Angiodysplasia of colon without hemorrhage: Secondary | ICD-10-CM | POA: Diagnosis present

## 2021-10-05 DIAGNOSIS — K31811 Angiodysplasia of stomach and duodenum with bleeding: Secondary | ICD-10-CM | POA: Diagnosis present

## 2021-10-05 DIAGNOSIS — I132 Hypertensive heart and chronic kidney disease with heart failure and with stage 5 chronic kidney disease, or end stage renal disease: Secondary | ICD-10-CM | POA: Diagnosis present

## 2021-10-05 DIAGNOSIS — F17200 Nicotine dependence, unspecified, uncomplicated: Secondary | ICD-10-CM

## 2021-10-05 DIAGNOSIS — G894 Chronic pain syndrome: Secondary | ICD-10-CM | POA: Diagnosis present

## 2021-10-05 DIAGNOSIS — J189 Pneumonia, unspecified organism: Secondary | ICD-10-CM | POA: Diagnosis not present

## 2021-10-05 DIAGNOSIS — R109 Unspecified abdominal pain: Secondary | ICD-10-CM | POA: Insufficient documentation

## 2021-10-05 DIAGNOSIS — G8929 Other chronic pain: Secondary | ICD-10-CM

## 2021-10-05 DIAGNOSIS — A048 Other specified bacterial intestinal infections: Secondary | ICD-10-CM

## 2021-10-05 DIAGNOSIS — N186 End stage renal disease: Secondary | ICD-10-CM | POA: Diagnosis present

## 2021-10-05 LAB — COMPREHENSIVE METABOLIC PANEL
ALT: 16 U/L (ref 0–44)
AST: 46 U/L — ABNORMAL HIGH (ref 15–41)
Albumin: 2.3 g/dL — ABNORMAL LOW (ref 3.5–5.0)
Alkaline Phosphatase: 124 U/L (ref 38–126)
Anion gap: 19 — ABNORMAL HIGH (ref 5–15)
BUN: 41 mg/dL — ABNORMAL HIGH (ref 8–23)
CO2: 28 mmol/L (ref 22–32)
Calcium: 9 mg/dL (ref 8.9–10.3)
Chloride: 96 mmol/L — ABNORMAL LOW (ref 98–111)
Creatinine, Ser: 5.96 mg/dL — ABNORMAL HIGH (ref 0.61–1.24)
GFR, Estimated: 10 mL/min — ABNORMAL LOW (ref >60)
Glucose, Bld: 96 mg/dL (ref 70–99)
Potassium: 4.4 mmol/L (ref 3.5–5.1)
Sodium: 143 mmol/L (ref 135–145)
Total Bilirubin: 0.8 mg/dL (ref 0.3–1.2)
Total Protein: 7.5 g/dL (ref 6.5–8.1)

## 2021-10-05 LAB — PREPARE RBC (CROSSMATCH)

## 2021-10-05 LAB — HEMOGLOBIN AND HEMATOCRIT, BLOOD
HCT: 23 % — ABNORMAL LOW (ref 39.0–52.0)
HCT: 21.6 % — ABNORMAL LOW (ref 39.0–52.0)
Hemoglobin: 7.7 g/dL — ABNORMAL LOW (ref 13.0–17.0)
Hemoglobin: 7.3 g/dL — ABNORMAL LOW (ref 13.0–17.0)

## 2021-10-05 LAB — POC OCCULT BLOOD, ED: Fecal Occult Bld: POSITIVE — AB

## 2021-10-05 LAB — RETICULOCYTES
Immature Retic Fract: 23.8 % — ABNORMAL HIGH (ref 2.3–15.9)
RBC.: 2.35 MIL/uL — ABNORMAL LOW (ref 4.22–5.81)
Retic Count, Absolute: 70.7 10*3/uL (ref 19.0–186.0)
Retic Ct Pct: 3 % (ref 0.4–3.1)

## 2021-10-05 LAB — VITAMIN B12: Vitamin B-12: 303 pg/mL (ref 180–914)

## 2021-10-05 LAB — CBC
HCT: 21.8 % — ABNORMAL LOW (ref 39.0–52.0)
Hemoglobin: 7.3 g/dL — ABNORMAL LOW (ref 13.0–17.0)
MCH: 32.9 pg (ref 26.0–34.0)
MCHC: 33.5 g/dL (ref 30.0–36.0)
MCV: 98.2 fL (ref 80.0–100.0)
Platelets: 146 10*3/uL — ABNORMAL LOW (ref 150–400)
RBC: 2.22 MIL/uL — ABNORMAL LOW (ref 4.22–5.81)
RDW: 19.7 % — ABNORMAL HIGH (ref 11.5–15.5)
WBC: 4.8 10*3/uL (ref 4.0–10.5)
nRBC: 0 % (ref 0.0–0.2)

## 2021-10-05 LAB — IRON AND TIBC
Iron: 188 ug/dL — ABNORMAL HIGH (ref 45–182)
Saturation Ratios: 63 % — ABNORMAL HIGH (ref 17.9–39.5)
TIBC: 298 ug/dL (ref 250–450)
UIBC: 110 ug/dL

## 2021-10-05 LAB — FERRITIN: Ferritin: 153 ng/mL (ref 24–336)

## 2021-10-05 LAB — FOLATE: Folate: 9 ng/mL (ref >5.9)

## 2021-10-05 LAB — PROTIME-INR
INR: 1.1 (ref 0.8–1.2)
Prothrombin Time: 14 seconds (ref 11.4–15.2)

## 2021-10-05 MED ORDER — SODIUM CHLORIDE 0.9 % IV SOLN
50.0000 ug/h | INTRAVENOUS | Status: DC
  Administered 2021-10-05 – 2021-10-08 (×7): 50 ug/h via INTRAVENOUS
  Filled 2021-10-05 (×8): qty 1

## 2021-10-05 MED ORDER — NICOTINE 14 MG/24HR TD PT24
14.0000 mg | MEDICATED_PATCH | Freq: Every day | TRANSDERMAL | Status: DC
  Administered 2021-10-05 – 2021-10-09 (×5): 14 mg via TRANSDERMAL
  Filled 2021-10-05 (×6): qty 1

## 2021-10-05 MED ORDER — PANTOPRAZOLE SODIUM 40 MG IV SOLR: 40.0000 mg | Freq: Two times a day (BID) | INTRAVENOUS | Status: DC

## 2021-10-05 MED ORDER — OCTREOTIDE LOAD VIA INFUSION
50.0000 ug | Freq: Once | INTRAVENOUS | Status: AC
  Administered 2021-10-05: 50 ug via INTRAVENOUS
  Filled 2021-10-05: qty 25

## 2021-10-05 MED ORDER — ACETAMINOPHEN 650 MG RE SUPP: 650.0000 mg | Freq: Four times a day (QID) | RECTAL | Status: DC | PRN

## 2021-10-05 MED ORDER — PEG-KCL-NACL-NASULF-NA ASC-C 100 G PO SOLR
0.5000 | Freq: Once | ORAL | Status: AC
Start: 2021-10-05 — End: 2021-10-05
  Administered 2021-10-05: 100 g via ORAL
  Filled 2021-10-05: qty 1

## 2021-10-05 MED ORDER — PANTOPRAZOLE INFUSION (NEW) - SIMPLE MED
8.0000 mg/h | INTRAVENOUS | Status: DC
  Filled 2021-10-05: qty 100

## 2021-10-05 MED ORDER — HYDROMORPHONE HCL 1 MG/ML IJ SOLN
0.5000 mg | INTRAMUSCULAR | Status: DC | PRN
  Administered 2021-10-05 – 2021-10-07 (×8): 0.5 mg via INTRAVENOUS
  Filled 2021-10-05 (×8): qty 1

## 2021-10-05 MED ORDER — OXYCODONE-ACETAMINOPHEN 5-325 MG PO TABS
1.0000 | ORAL_TABLET | Freq: Four times a day (QID) | ORAL | Status: DC | PRN
  Administered 2021-10-05 – 2021-10-07 (×4): 1 via ORAL
  Filled 2021-10-05 (×4): qty 1

## 2021-10-05 MED ORDER — SODIUM CHLORIDE 0.9 % IV SOLN
10.0000 mL/h | Freq: Once | INTRAVENOUS | Status: AC
  Administered 2021-10-05: 10 mL/h via INTRAVENOUS

## 2021-10-05 MED ORDER — FENTANYL CITRATE PF 50 MCG/ML IJ SOSY
50.0000 ug | PREFILLED_SYRINGE | INTRAMUSCULAR | Status: DC | PRN
  Administered 2021-10-05: 50 ug via INTRAVENOUS
  Filled 2021-10-05: qty 1

## 2021-10-05 MED ORDER — PANTOPRAZOLE 80MG IVPB - SIMPLE MED
80.0000 mg | Freq: Once | INTRAVENOUS | Status: DC
  Filled 2021-10-05: qty 100

## 2021-10-05 MED ORDER — PANTOPRAZOLE SODIUM 40 MG IV SOLR: 40.0000 mg | Freq: Once | INTRAVENOUS | Status: DC

## 2021-10-05 MED ORDER — PANTOPRAZOLE INFUSION (NEW) - SIMPLE MED
8.0000 mg/h | INTRAVENOUS | Status: AC
  Administered 2021-10-05 – 2021-10-08 (×8): 8 mg/h via INTRAVENOUS
  Filled 2021-10-05 (×2): qty 80
  Filled 2021-10-05: qty 100
  Filled 2021-10-05 (×2): qty 80
  Filled 2021-10-05 (×2): qty 100
  Filled 2021-10-05: qty 80
  Filled 2021-10-05: qty 100
  Filled 2021-10-05: qty 80

## 2021-10-05 MED ORDER — ACETAMINOPHEN 325 MG PO TABS
650.0000 mg | ORAL_TABLET | Freq: Four times a day (QID) | ORAL | Status: DC | PRN
  Administered 2021-10-07: 650 mg via ORAL
  Filled 2021-10-05: qty 2

## 2021-10-05 NOTE — Anesthesia Preprocedure Evaluation (Addendum)
Anesthesia Evaluation  Patient identified by MRN, date of birth, ID band Patient awake    Reviewed: Allergy & Precautions, NPO status , Patient's Chart, lab work & pertinent test results, reviewed documented beta blocker date and time   History of Anesthesia Complications Negative for: history of anesthetic complications  Airway Mallampati: II  TM Distance: >3 FB Neck ROM: Full    Dental  (+) Missing, Dental Advisory Given, Chipped, Poor Dentition   Pulmonary Current Smoker and Patient abstained from smoking.,    breath sounds clear to auscultation       Cardiovascular hypertension, Pt. on medications and Pt. on home beta blockers (-) angina+ Peripheral Vascular Disease   Rhythm:Regular Rate:Normal  05/2021 ECHO: EF 55-60%, mild LVH, normal LVF, normal RVF, no significant valvular abnormalities   Neuro/Psych H/o IC hemorrhage    GI/Hepatic PUD, GERD  Medicated and Controlled,(+) Hepatitis -  Endo/Other  diabetes (glu 82)Morbid obesity  Renal/GU Dialysis and ESRFRenal disease (K+ 5.0)     Musculoskeletal   Abdominal (+) + obese,   Peds  Hematology  (+) Blood dyscrasia (Hb 7.3), anemia ,   Anesthesia Other Findings   Reproductive/Obstetrics                            Anesthesia Physical Anesthesia Plan  ASA: 3  Anesthesia Plan: MAC   Post-op Pain Management: Minimal or no pain anticipated   Induction:   PONV Risk Score and Plan: 0 and Ondansetron and Treatment may vary due to age or medical condition  Airway Management Planned: Natural Airway and Nasal Cannula  Additional Equipment: None  Intra-op Plan:   Post-operative Plan:   Informed Consent: I have reviewed the patients History and Physical, chart, labs and discussed the procedure including the risks, benefits and alternatives for the proposed anesthesia with the patient or authorized representative who has indicated his/her  understanding and acceptance.     Dental advisory given  Plan Discussed with: CRNA and Surgeon  Anesthesia Plan Comments:       Anesthesia Quick Evaluation

## 2021-10-05 NOTE — ED Notes (Signed)
Radiology at bedside

## 2021-10-05 NOTE — ED Notes (Signed)
When pt was placed on bedpan for a BM pt started c/o abd pain. Pt is still c/o 10/10 abd pain at this time. Admit provider paged by secretary and provided pt with pain meds as well

## 2021-10-05 NOTE — ED Notes (Signed)
Pt was taken off bedpan another black tarry stool at this time

## 2021-10-05 NOTE — H&P (Signed)
History and Physical    Daniel [name] IEP:[id number] DOB: Jul 14, 1960 DOA: 10/05/2021  PCP: [name], NP Patient coming from: Home.  Lives alone.  Uses wheelchair at baseline.  Chief Complaint: Dark stool  HPI: Daniel [name] is a 61 year old M with PMH of ESRD on HD MWF, IDA, recurrent GIB, duodenal ulcer, gastric ulcer, small bowel AVM, PAD, chronic pain syndrome, DM-2, orthostatic hypotension, EtOH use, tobacco use disorder, anxiety, left BKA, QTc and recent hospitalization from 6/3-6/4 for symptomatic anemia in the setting of recurrent GI bleed and multifocal pneumonia returning with ongoing melena.  Patient reports dark tarry stool ongoing since he left the hospital.  He also reports an episode of epistaxis yesterday that he has been managing with nose pack.  He noticed blood clot after he removed his nose pack.  He does not have epistaxis currently.  He has been taking his low-dose aspirin since he left the hospital.  He denies NSAID use.  He is not on anticoagulation.  He reports some shortness of breath but denies lightheadedness, chest pain, nausea, vomiting or abdominal pain.  He also reports bilateral shoulder pain.  He denies trauma or fall.  He denies prior history of shoulder pain although he seems to have history of chronic pain and rotator cuff problem on his chart.  He is on Percocet at home.  He denies numbness, tingling or focal weakness.  He denies fever or chills.  He says he has full session of dialysis yesterday.  Still makes some urine.   States living alone.  Uses wheelchair at baseline.  Smokes about half a pack a day.  He denies alcohol use or recreational drug use.  Prefers to remain full code.  In ED, stable vitals.  No acute finding on CMP.  Hgb 7.3 (9.0 on 6/8 and 11.3 on 6/4).  Hemoccult positive.  Platelet 146.  Coag labs normal.  [facility] consulted.  A unit of blood, Protonix and octreotide ordered.  Hospitalist service called for  admission.  ROS Full review of system negative except for what is listed in HPI.  PMH Past Medical History:  Diagnosis Date   Anemia    Diabetes mellitus without complication [facility])    patient denies   Dialysis patient [facility])    End stage chronic kidney disease ([place])    Hypertension    ICH (intracerebral hemorrhage) ([place]) 05/20/2017   PAD (peripheral artery disease) (HCC)    Shoulder pain, left 06/28/2013   PSH Past Surgical History:  Procedure Laterality Date   A/V FISTULAGRAM N/A 08/15/2020   Procedure: A/V FISTULAGRAM - Left Upper;  Surgeon: Cherre Robins, MD;  Location: [place] [facility];  Service: Cardiovascular;  Laterality: N/A;   ABDOMINAL AORTOGRAM W/LOWER EXTREMITY N/A 04/08/2021   Procedure: ABDOMINAL AORTOGRAM W/LOWER EXTREMITY;  Surgeon: Waynetta Sandy, MD;  Location: [place] [facility];  Service: Cardiovascular;  Laterality: N/A;   AMPUTATION Left 04/16/2021   Procedure: LEFT BELOW KNEE AMPUTATION;  Surgeon: Angelia Mould, MD;  Location: [place];  Service: Vascular;  Laterality: Left;   AMPUTATION Left 06/17/2021   Procedure: REVISION AMPUTATION BELOW KNEE;  Surgeon: Broadus John, MD;  Location: [place];  Service: Vascular;  Laterality: Left;   APPENDECTOMY     APPLICATION OF WOUND VAC Left 06/17/2021   Procedure: APPLICATION OF WOUND VAC;  Surgeon: Broadus John, MD;  Location: [place];  Service: Vascular;  Laterality: Left;   AV FISTULA PLACEMENT Left 08/03/2019  Procedure: LEFT ARM ARTERIOVENOUS (AV) CEPHALIC  FISTULA CREATION;  Surgeon: Waynetta Sandy, MD;  Location: [place];  Service: Vascular;  Laterality: Left;   BIOPSY  06/30/2019   Procedure: BIOPSY;  Surgeon: Ronald Lobo, MD;  Location: [facility] ENDOSCOPY;  Service: Endoscopy;;   BIOPSY  08/02/2019   Procedure: BIOPSY;  Surgeon: Yetta Flock, MD;  Location: [facility] ENDOSCOPY;  Service: Gastroenterology;;   BIOPSY  02/08/2021   Procedure: BIOPSY;  Surgeon: Sharyn Creamer, MD;   Location: [facility] ENDOSCOPY;  Service: Gastroenterology;;   BIOPSY  02/24/2021   Procedure: BIOPSY;  Surgeon: [name], MD;  Location: [facility] ENDOSCOPY;  Service: Gastroenterology;;   BIOPSY  03/26/2021   Procedure: BIOPSY;  Surgeon: Lavena Bullion, DO;  Location: [place];  Service: Gastroenterology;;   BIOPSY  08/06/2021   Procedure: BIOPSY;  Surgeon: [name], MD;  Location: [place];  Service: Gastroenterology;;   BIOPSY  09/15/2021   Procedure: BIOPSY;  Surgeon: Sharyn Creamer, MD;  Location: [place];  Service: Gastroenterology;;   COLONOSCOPY  01/23/2012   Procedure: COLONOSCOPY;  Surgeon: Danie Binder, MD;  [facility];  Service: Endoscopy;  Laterality: N/A;  11:10 AM   COLONOSCOPY WITH PROPOFOL N/A 06/30/2019   Procedure: COLONOSCOPY WITH PROPOFOL;  Surgeon: Ronald Lobo, MD;  Location: [place];  Service: Endoscopy;  Laterality: N/A;   ENTEROSCOPY N/A 08/02/2019   Procedure: ENTEROSCOPY;  Surgeon: Yetta Flock, MD;  Location: [facility] ENDOSCOPY;  Service: Gastroenterology;  Laterality: N/A;   ENTEROSCOPY N/A 02/24/2021   Procedure: ENTEROSCOPY;  Surgeon: [name], MD;  Location: [facility] ENDOSCOPY;  Service: Gastroenterology;  Laterality: N/A;   ENTEROSCOPY N/A 03/26/2021   Procedure: ENTEROSCOPY;  Surgeon: Lavena Bullion, DO;  Location: [place];  Service: Gastroenterology;  Laterality: N/A;   ENTEROSCOPY N/A 07/05/2021   Procedure: ENTEROSCOPY;  Surgeon: Carol Ada, MD;  Location: [place];  Service: Gastroenterology;  Laterality: N/A;   ENTEROSCOPY N/A 08/06/2021   Procedure: ENTEROSCOPY;  Surgeon: [name], MD;  Location: [facility] ENDOSCOPY;  Service: Gastroenterology;  Laterality: N/A;   ENTEROSCOPY N/A 08/24/2021   Procedure: ENTEROSCOPY;  Surgeon: Ladene Artist, MD;  Location: [facility] ENDOSCOPY;  Service: Gastroenterology;  Laterality: N/A;   ENTEROSCOPY N/A 09/15/2021   Procedure: ENTEROSCOPY;  Surgeon:  Sharyn Creamer, MD;  Location: [facility] ENDOSCOPY;  Service: Gastroenterology;  Laterality: N/A;   ESOPHAGOGASTRODUODENOSCOPY N/A 08/10/2020   Procedure: ESOPHAGOGASTRODUODENOSCOPY (EGD);  Surgeon: Jerene Bears, MD;  Location: [facility] ENDOSCOPY;  Service: Gastroenterology;  Laterality: N/A;   ESOPHAGOGASTRODUODENOSCOPY (EGD) WITH PROPOFOL N/A 06/30/2019   Procedure: ESOPHAGOGASTRODUODENOSCOPY (EGD) WITH PROPOFOL;  Surgeon: Ronald Lobo, MD;  Location: [place];  Service: Endoscopy;  Laterality: N/A;   ESOPHAGOGASTRODUODENOSCOPY (EGD) WITH PROPOFOL N/A 01/12/2021   Procedure: ESOPHAGOGASTRODUODENOSCOPY (EGD) WITH PROPOFOL;  Surgeon: Sharyn Creamer, MD;  Location: [place];  Service: Gastroenterology;  Laterality: N/A;   ESOPHAGOGASTRODUODENOSCOPY (EGD) WITH PROPOFOL N/A 02/08/2021   Procedure: ESOPHAGOGASTRODUODENOSCOPY (EGD) WITH PROPOFOL;  Surgeon: Sharyn Creamer, MD;  Location: [place];  Service: Gastroenterology;  Laterality: N/A;   FISTULA SUPERFICIALIZATION Left 10/17/2019   Procedure: LEFT UPPER EXTREMITY FISTULA REVISION, SIDE BRANCH LIGATION,  AND SUPERFICIALIZATION;  Surgeon: Marty Heck, MD;  Location: [place];  Service: Vascular;  Laterality: Left;   FISTULA SUPERFICIALIZATION Left 44/06/4740   Procedure: PLICATION OF ANEURYSM LEFT ARTERIOVENOUS FISTULA;  Surgeon: Angelia Mould, MD;  Location: [place];  Service: Vascular;  Laterality: Left;   FLEXIBLE SIGMOIDOSCOPY  N/A 07/05/2021   Procedure: FLEXIBLE SIGMOIDOSCOPY;  Surgeon: Carol Ada, MD;  Location: [place];  Service: Gastroenterology;  Laterality: N/A;   GIVENS CAPSULE STUDY N/A 06/30/2019   Procedure: GIVENS CAPSULE STUDY;  Surgeon: Ronald Lobo, MD;  Location: [place];  Service: Endoscopy;  Laterality: N/A;   GIVENS CAPSULE STUDY N/A 08/25/2021   Procedure: GIVENS CAPSULE STUDY;  Surgeon: Ladene Artist, MD;  Location: [place];  Service: Gastroenterology;  Laterality: N/A;   HEMOSTASIS CLIP  PLACEMENT  08/10/2020   Procedure: HEMOSTASIS CLIP PLACEMENT;  Surgeon: Jerene Bears, MD;  Location: [place];  Service: Gastroenterology;;   HEMOSTASIS CLIP PLACEMENT  01/12/2021   Procedure: HEMOSTASIS CLIP PLACEMENT;  Surgeon: Sharyn Creamer, MD;  Location: [place];  Service: Gastroenterology;;   HEMOSTASIS CONTROL  08/02/2019   Procedure: HEMOSTASIS CONTROL;  Surgeon: Yetta Flock, MD;  Location: [place];  Service: Gastroenterology;;   HOT HEMOSTASIS  02/24/2021   Procedure: HOT HEMOSTASIS (ARGON PLASMA COAGULATION/BICAP);  Surgeon: [name], MD;  Location: [facility] ENDOSCOPY;  Service: Gastroenterology;;   HOT HEMOSTASIS N/A 03/26/2021   Procedure: HOT HEMOSTASIS (ARGON PLASMA COAGULATION/BICAP);  Surgeon: Lavena Bullion, DO;  Location: [facility] ENDOSCOPY;  Service: Gastroenterology;  Laterality: N/A;   HOT HEMOSTASIS N/A 07/05/2021   Procedure: HOT HEMOSTASIS (ARGON PLASMA COAGULATION/BICAP);  Surgeon: Carol Ada, MD;  Location: [place];  Service: Gastroenterology;  Laterality: N/A;   HOT HEMOSTASIS N/A 09/15/2021   Procedure: HOT HEMOSTASIS (ARGON PLASMA COAGULATION/BICAP);  Surgeon: Sharyn Creamer, MD;  Location: [place];  Service: Gastroenterology;  Laterality: N/A;   INCISION AND DRAINAGE ABSCESS N/A 06/29/2016   Procedure: INCISION AND DRAINAGE ABDOMINAL WALL ABSCESS;  Surgeon: Alphonsa Overall, MD;  Location: [facility];  Service: General;  Laterality: N/A;   INSERTION OF DIALYSIS CATHETER Right 08/03/2019   Procedure: INSERTION OF DIALYSIS CATHETER;  Surgeon: Waynetta Sandy, MD;  Location: [place];  Service: Vascular;  Laterality: Right;   INSERTION OF DIALYSIS CATHETER Right 10/22/2019   Procedure: INSERTION OF 23CM TUNNELED DIALYSIS CATHETER RIGHT INTERNAL JUGULAR;  Surgeon: Angelia Mould, MD;  Location: [place];  Service: Vascular;  Laterality: Right;   INSERTION OF DIALYSIS CATHETER Right 08/12/2020   Procedure: INSERTION OF Right internal  Jugular TUNNELED  DIALYSIS CATHETER.;  Surgeon: Waynetta Sandy, MD;  Location: [place];  Service: Vascular;  Laterality: Right;   INSERTION OF DIALYSIS CATHETER N/A 04/19/2021   Procedure: INSERTION OF TUNNELED DIALYSIS CATHETER;  Surgeon: Angelia Mould, MD;  Location: [facility];  Service: Vascular;  Laterality: N/A;   IR RADIOLOGIST EVAL & MGMT  09/12/2021   Left heel surgery     LOWER EXTREMITY ANGIOGRAPHY N/A 07/08/2021   Procedure: Lower Extremity Angiography;  Surgeon: Cherre Robins, MD;  Location: [place] [facility];  Service: Cardiovascular;  Laterality: N/A;   PENILE BIOPSY N/A 03/26/2020   Procedure: PENILE ULCER DEBRIDEMENT;  Surgeon: Remi Haggard, MD;  Location: [facility];  Service: Urology;  Laterality: N/A;  30 MINS   PERIPHERAL VASCULAR INTERVENTION Right 07/08/2021   Procedure: PERIPHERAL VASCULAR INTERVENTION;  Surgeon: Cherre Robins, MD;  Location: [place] [facility];  Service: Cardiovascular;  Laterality: Right;   SCLEROTHERAPY  01/12/2021   Procedure: Clide Deutscher;  Surgeon: Sharyn Creamer, MD;  Location: [facility] ENDOSCOPY;  Service: Gastroenterology;;   Clide Deutscher  02/24/2021   Procedure: Clide Deutscher;  Surgeon: [name], MD;  Location: [place];  Service: Gastroenterology;;   SUBMUCOSAL TATTOO INJECTION  08/24/2021  Procedure: SUBMUCOSAL TATTOO INJECTION;  Surgeon: Ladene Artist, MD;  Location: [facility] ENDOSCOPY;  Service: Gastroenterology;;   SUBMUCOSAL TATTOO INJECTION  09/15/2021   Procedure: SUBMUCOSAL TATTOO INJECTION;  Surgeon: Sharyn Creamer, MD;  Location: [facility] ENDOSCOPY;  Service: Gastroenterology;;   Carolin Guernsey Family History  Problem Relation Age of Onset   Colon cancer Neg Hx     Social Hx  reports that he has been smoking cigarettes. He has a 22.50 pack-year smoking history. He has never used smokeless tobacco. He reports that he does not currently use alcohol. He reports that he does not currently use drugs after having used the  following drugs: "Crack" cocaine.  Allergy Allergies  Allergen Reactions   Seroquel [Quetiapine] Other (See Comments)    Tardive kinesia/dystonia   Dilaudid [Hydromorphone Hcl] Itching and Other (See Comments)    Pt reports itchiness after IM injection    Home Meds Prior to Admission medications   Medication Sig Start Date End Date Taking? Authorizing Provider  amLODipine (NORVASC) 5 MG tablet Take 1 tablet (5 mg total) by mouth daily. 09/18/21 12/17/21 Yes [place] Donnamarie Poag, DO  aspirin EC 81 MG tablet Take 1 tablet (81 mg total) by mouth daily. 09/18/21 12/17/21 Yes [place] Eric J, DO  atorvastatin (LIPITOR) 40 MG tablet Take 1 tablet (40 mg total) by mouth daily. 09/18/21 12/17/21 Yes [place] Eric J, DO  gabapentin (NEURONTIN) 100 MG capsule Take 1 capsule (100 mg total) by mouth 3 (three) times daily. 09/18/21 12/17/21 Yes [place] Donnamarie Poag, DO  oxyCODONE-acetaminophen (PERCOCET) 5-325 MG tablet Take 1 tablet by mouth every 6 (six) hours as needed. Patient taking differently: Take 1 tablet by mouth every 6 (six) hours as needed for moderate pain. 09/25/21  Yes Milton Ferguson, MD  pantoprazole (PROTONIX) 40 MG tablet Take 1 tablet (40 mg total) by mouth 2 (two) times daily before a meal. 09/18/21 12/17/21 Yes [place] Donnamarie Poag, DO  traZODone (DESYREL) 50 MG tablet Take 1 tablet (50 mg total) by mouth at bedtime as needed for sleep. 09/18/21 10/18/21 Yes [place] Eric J, DO  calcium acetate, Phos Binder, (PHOSLYRA) 667 MG/5ML SOLN Take 5 mLs (667 mg total) by mouth 3 (three) times daily with meals. Patient not taking: Reported on 10/05/2021 09/18/21 12/17/21  [place] Eric J, DO  cefdinir (OMNICEF) 300 MG capsule Take 1 capsule (300 mg total) by mouth every Monday, Wednesday, and Friday for 3 doses. Take after dialysis Patient not taking: Reported on 10/05/2021 09/30/21 10/05/21  Antonieta Pert, MD  Darbepoetin Alfa (ARANESP) 100 MCG/0.5ML SOSY injection Inject 0.5 mLs (100 mcg total) into the vein every 7 (seven)  days for 8 doses. Patient not taking: Reported on 09/15/2021 08/19/21 10/08/21  Dana Allan I, MD  ivabradine (CORLANOR) 5 MG TABS tablet Take 1 tablet (5 mg total) by mouth 2 (two) times daily with a meal. Take 2 Tablets 90-120 minutes prior to Scan. Patient not taking: Reported on 10/05/2021 09/26/21   Warren Lacy, PA-C  metoprolol tartrate (LOPRESSOR) 100 MG tablet Take 1 tablet (100 mg total) by mouth 2 (two) times daily. Take 1 Tablet 90-120 minutes prior to scan. Patient not taking: Reported on 10/05/2021 09/26/21 12/25/21  Warren Lacy, PA-C  colchicine 0.6 MG tablet Take 0.5 tablets (0.3 mg total) by mouth 2 (two) times daily. 07/24/20 07/29/20  Noemi Chapel, MD  furosemide (LASIX) 40 MG tablet Take 1 tablet (40 mg total) by mouth daily. 07/25/19 02/09/20  Ladona Horns, MD    Physical  Exam: Vitals:   10/05/21 1100 10/05/21 1105 10/05/21 1209 10/05/21 1231  BP: (!) 141/84  (!) 197/56 (!) 151/70  Pulse: 89  88 89  Resp: '15  18 19  '$ Temp:   (!) 97.4 F (36.3 C) 97.7 F (36.5 C)  TempSrc:   Axillary Oral  SpO2: 99%  100% 99%  Weight:  108.9 kg    Height:  '5\' 11"'$  (1.803 m)      GENERAL: No acute distress.  Appears well.  HEENT: MMM.  Vision and hearing grossly intact.  NECK: Supple.  No apparent JVD.  RESP:  No IWOB. Good air movement bilaterally. CVS:  RRR. Heart sounds normal.  ABD/GI/GU: Bowel sounds present. Soft. Non tender.  MSK/EXT: Left AKA stump with healing wound.  No signs of infection.  Trace RLE edema.  Fairly normal bilateral shoulder exam except for some pain with range of motion.  SKIN: no apparent skin lesion or wound NEURO: Awake, alert and oriented appropriately.  No gross deficit.  PSYCH: Calm. Normal affect.   Personally Reviewed Radiological Exams See HPI   Personally Reviewed Labs: CBC: Recent Labs  Lab 09/28/21 2014 09/29/21 0454 10/03/21 0900 10/05/21 0729  WBC 5.6 5.8 5.3 4.8  NEUTROABS  --   --  2.9  --   HGB 10.1* 11.3* 9.0* 7.3*   HCT 29.0* 31.1* 26.7* 21.8*  MCV 94.2 93.7 97.4 98.2  PLT 130* 132* 142* 329*   Basic Metabolic Panel: Recent Labs  Lab 09/28/21 2240 10/03/21 0900 10/05/21 0729  NA 134* 137 143  K 5.0 4.6 4.4  CL 93* 96* 96*  CO2 '24 22 28  '$ GLUCOSE 87 97 96  BUN 36* 46* 41*  CREATININE 6.46* 8.09* 5.96*  CALCIUM 8.9 9.7 9.0  MG 1.9  --   --    GFR: Estimated Creatinine Clearance: 16.3 mL/min (A) (by C-G formula based on SCr of 5.96 mg/dL (H)). Liver Function Tests: Recent Labs  Lab 10/03/21 0900 10/05/21 0729  AST 38 46*  ALT 11 16  ALKPHOS 144* 124  BILITOT 0.7 0.8  PROT 8.3* 7.5  ALBUMIN 3.2* 2.3*   No results for input(s): "LIPASE", "AMYLASE" in the last 168 hours. No results for input(s): "AMMONIA" in the last 168 hours. Coagulation Profile: Recent Labs  Lab 10/05/21 0729  INR 1.1   Cardiac Enzymes: No results for input(s): "CKTOTAL", "CKMB", "CKMBINDEX", "TROPONINI" in the last 168 hours. BNP (last 3 results) No results for input(s): "PROBNP" in the last 8760 hours. HbA1C: No results for input(s): "HGBA1C" in the last 72 hours. CBG: No results for input(s): "GLUCAP" in the last 168 hours. Lipid Profile: No results for input(s): "CHOL", "HDL", "LDLCALC", "TRIG", "CHOLHDL", "LDLDIRECT" in the last 72 hours. Thyroid Function Tests: No results for input(s): "TSH", "T4TOTAL", "FREET4", "T3FREE", "THYROIDAB" in the last 72 hours. Anemia Panel: No results for input(s): "VITAMINB12", "FOLATE", "FERRITIN", "TIBC", "IRON", "RETICCTPCT" in the last 72 hours. Urine analysis:    Component Value Date/Time   COLORURINE YELLOW 07/31/2019 2128   APPEARANCEUR CLEAR 07/31/2019 2128   LABSPEC 1.009 07/31/2019 2128   PHURINE 5.0 07/31/2019 2128   GLUCOSEU NEGATIVE 07/31/2019 2128   HGBUR SMALL (A) 07/31/2019 2128   BILIRUBINUR NEGATIVE 07/31/2019 2128   KETONESUR NEGATIVE 07/31/2019 2128   PROTEINUR 100 (A) 09/03/2021 1150   UROBILINOGEN 0.2 12/31/2009 0746   NITRITE NEGATIVE  07/31/2019 2128   LEUKOCYTESUR NEGATIVE 07/31/2019 2128    Sepsis Labs:  None  Personally Reviewed EKG:  No EKG performed.  Assessment and plan: Principal Problem:   Acute on chronic blood loss anemia Active Problems:   Melena   ESRD (end stage renal disease) on dialysis ([place])   Hepatocellular carcinoma (HCC)   Essential hypertension   DM2 (diabetes mellitus, type 2) ([place])   AVM (arteriovenous malformation) of small bowel, acquired with hemorrhage   Prolonged QT interval   Upper GI bleed   AVM (arteriovenous malformation) of duodenum, acquired   Chronic diastolic CHF (congestive heart failure) (HCC)   Recurrent gastrointestinal hemorrhage   Chronic pain disorder  Acute on chronic blood loss anemia in the setting of recurrent upper GI bleed and epistaxis History of small bowel AVM/EV/PHG-noted on his small bowel endoscopy on 5/21 Recent Labs    09/16/21 1750 09/17/21 0314 09/18/21 0918 09/19/21 1004 09/26/21 1436 09/28/21 0729 09/28/21 2014 09/29/21 0454 10/03/21 0900 10/05/21 0729  HGB 8.9* 8.4* 8.3* 8.9* 7.8* 6.9* 10.1* 11.3* 9.0* 7.3*  Hemodynamically stable.  Patient is on low-dose aspirin for PAD.  Denies other NSAID use.  Not sure if he is compliant with his Protonix.  He was supposed to be taking twice daily. -Continue holding aspirin -One unit of blood ordered in ED -Check anemia panel -Continue Protonix and octreotide.  -Requested RN to secured to PIV.  IV team consulted due to difficult placement. -Monitor H&H every 8 hours. -Follow recommendation by [facility]  Acute bilateral shoulder pain: Likely due to chronic rotator cuff tears and degenerative changes of acromioclavicular joint as noted on his MRIs on 09/17/2021. -Pain control with Tylenol, Percocet and IV Dilaudid  ESRD on HD MWF-reports full session HD on Friday. -Will notify nephrology if he is still here Monday -No need for urgent dialysis.  Chronic diastolic CHF: TTE in 07/5807 with LVEF of  55 to 60%, no RWMA and RVSP of 48.4.  Appears euvolemic on exam.  Not on diuretics. -Fluid management with dialysis -Hold home metoprolol, Corlanor  History of hepatocellular carcinoma -Outpatient follow-up.  Left BKA-appears stable. -Continue Ace wrap  Tobacco use disorder: Smokes about half a pack a day.  Encouraged cessation. -Continue nicotine patch  DVT prophylaxis: SCD to right leg  Code Status: Full code Family Communication: Patient declined  Consults called: Gastroenterology Admission status: Inpatient   Mercy Riding MD [facility]  If 7PM-7AM, please contact night-coverage [email or web address]  10/05/2021, 12:45 PM

## 2021-10-05 NOTE — ED Notes (Signed)
Attempted IV start x 2 for second line d/t IV incompatibilities

## 2021-10-05 NOTE — H&P (View-Only) (Signed)
Referring Provider: Pati Gallo, ED PA Primary Care Physician:  [name], NP Primary Gastroenterologist:  Althia Forts, seen by Dr. Havery Moros first in our group as inpatient but has not followed up as outpatient  Reason for Consultation:  GI bleeding  HPI: Daniel [name] is a 61 y.o. male with a past medical history significant for hypertension, diabetes, PAD status post right SFA stenting 3/23 on Plavix, intracerebral hemorrhage, ESRD on dialysis MWF, chronic anemia, diverticulosis and recurrent GI bleeds/duodenal ulcer/small bowel AVMs as well as erosive esophagitis and hyperplastic duodenal polyps, who he presented to the hospital this morning with GI bleeding.    The patient explains that he has never stopped seeing melenic stool since being in the hospital last at the end of May (discharged on 5/24 after a stay for GI bleeding) or even prior to that.  Also complaining of shoulder pain.  No abdominal pain.  No nausea or vomiting but says that his appetite is not great.  Had a nosebleed but he says that it was not a large amount.   Previous GI procedures: Small bowel enteroscopy 09/15/2021  Identified 3 bleeding AVMs in the duodenum treated with APC, likely source of upper GI bleeding. Small esophageal varices, portal hypertensive gastropathy and a single gastric polyp were noted.   Pill capsule endoscopy 08/25/21 With duodenal polyps, no overt cause for bleeding symptoms noted, small red spot versus diminutive AVM in the bowl but no significant heme anywhere, dark green liquid secretions and stool throughout the bowel, nondiagnostic exam   Small bowel enteroscopy 08/24/2021 Examined portion of the jejunum normal, distal extent of the exam tattooed, few duodenal polyps, grade 1 esophageal varices, portal hypertensive gastropathy; plan to continue to hold Plavix, change Pantoprazole to 40 mg twice daily, continue Aspirin, possible bleeding from portal gastropathy, schedule VCE    Small bowel enteroscopy  08/06/2021: - Mildly severe esophagitis with no bleeding. Biopsied. Given localized circumferential inflammation, suspect pill esophagitis. - Gastritis. - Friable gastric mucosa. Biopsied. - Multiple duodenal polyps. Biopsied. These are most likely reactive/hyperplastic, not adenomatous. These polyps were friable with contact oozing, and thus may be a source of GI bleeding. No AVMs were seen on this examination. - The examined portion of the jejunum was normal.   FINAL MICROSCOPIC DIAGNOSIS:   A. DUODENUM, POLYPECTOMY:  Hyperplastic polyps with nonspecific inflammation in the stroma.  There are no diagnostic features of celiac disease.  Negative for adenomatous change, dysplasia and malignancy.   B. PYLORIS, POLYPECTOMY:  Benign hyperplastic gastric polyp with focal erosion of the surface  epithelium.  H. pylori are not identified.  Negative for dysplasia and malignancy.   C. ESOPHAGUS, BIOPSY:  Acute erosive esophagitis.  Microorganisms with morphologic features consistent with Candida species  are present.  Negative for dysplasia and malignancy.    Small bowel enteroscopy by Dr. Benson Norway 07/05/2021: - Normal esophagus. - Friable gastric mucosa. - A few recently bleeding angioectasias in the duodenum. Treated with a monopolar probe. - A single non-bleeding angiodysplastic lesion in the jejunum. Treated with a monopolar probe. - No specimens collected.   Flexible sigmoidoscopy 07/05/2021: - Blood in the rectum, in the recto-sigmoid colon and in the sigmoid colon. - No specimens collected. -Abdominal/pelvic CT angiogram was recommended if he developed a rapid lower GI bleeding.   Small bowel enteroscopy by Dr. Bryan Lemma 03/26/2021: -Normal esophagus.  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 -Mild, nonulcerative gastritis.  Biopsied. -Mild inflammation with nodularity in the duodenal bulb.  This  overall appeared improved from images  on the prior studies. -2 nonbleeding angiectasia's in the duodenum.  Treated with argon plasma coagulation.   EGD 08/10/2020: - Normal esophagus. - Gastritis. - Bleeding duodenal - Bleeding duodenal ulcer with a visible vessel. Clips were placed x 3 with apparent hemostasis. - Duodenitis with nodularity and polypoid mucosa. - No specimens collected.   Small bowel enteroscopy 08/02/2019: - Esophagogastric landmarks identified. - Normal esophagus. - Normal stomach. - Prominent ampulla without overt adenomatous changes. Biopsied. - A few benign appearing duodenal polyps. Biopsied, one suspected AVM treated as above. 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 Treated with argon plasma coagulation (APC). - A single non-bleeding angiodysplastic lesion in the proximal jejunum. Treated with argon plasma coagulation   EGD 06/30/2019: - No source of iron deficiency anemia or heme positive stoIonlp eantideonstcopically evident. - Normal larynx. - Normal esophagus. - Normal stomach. - Duodenal polyp vs. prominent papilla, benign-appearing. Biopsied.   Colonoscopy 06/30/2019: - No active bleeding or source of heme positive stool/anemia seen. - Diverticulosis in the sigmoid colon. - Probable old blood in the terminal ileum, raising the question of a small bowel source (given negative egd). - The distal rectum and anal verge are normal on retroflexion view. - No specimens collected.   Past Medical History:  Diagnosis Date   Anemia    Diabetes mellitus without complication [facility])    patient denies   Dialysis patient [facility])    End stage chronic kidney disease ([place])    Hypertension    ICH (intracerebral hemorrhage) ([place]) 05/20/2017   PAD (peripheral artery disease) ([facility])    Shoulder pain, left 06/28/2013    Past Surgical History:  Procedure Laterality Date   A/V FISTULAGRAM N/A 08/15/2020   Procedure: A/V FISTULAGRAM - Left Upper;  Surgeon: Cherre Robins, MD;   Location: [place] [facility];  Service: Cardiovascular;  Laterality: N/A;   ABDOMINAL AORTOGRAM W/LOWER EXTREMITY N/A 04/08/2021   Procedure: ABDOMINAL AORTOGRAM W/LOWER EXTREMITY;  Surgeon: Waynetta Sandy, MD;  Location: [place] [facility];  Service: Cardiovascular;  Laterality: N/A;   AMPUTATION Left 04/16/2021   Procedure: LEFT BELOW KNEE AMPUTATION;  Surgeon: Angelia Mould, MD;  Location: [place];  Service: Vascular;  Laterality: Left;   AMPUTATION Left 06/17/2021   Procedure: REVISION AMPUTATION BELOW KNEE;  Surgeon: Broadus John, MD;  Location: [place];  Service: Vascular;  Laterality: Left;   APPENDECTOMY     APPLICATION OF WOUND VAC Left 06/17/2021   Procedure: APPLICATION OF WOUND VAC;  Surgeon: Broadus John, MD;  Location: [place];  Service: Vascular;  Laterality: Left;   AV FISTULA PLACEMENT Left 08/03/2019   Procedure: LEFT ARM ARTERIOVENOUS (AV) CEPHALIC  FISTULA CREATION;  Surgeon: Waynetta Sandy, MD;  Location: [place];  Service: Vascular;  Laterality: Left;   BIOPSY  06/30/2019   Procedure: BIOPSY;  Surgeon: Ronald Lobo, MD;  Location: [facility] ENDOSCOPY;  Service: Endoscopy;;   BIOPSY  08/02/2019   Procedure: BIOPSY;  Surgeon: Yetta Flock, MD;  Location: [facility] ENDOSCOPY;  Service: Gastroenterology;;   BIOPSY  02/08/2021   Procedure: BIOPSY;  Surgeon: Sharyn Creamer, MD;  Location: [facility] ENDOSCOPY;  Service: Gastroenterology;;   BIOPSY  02/24/2021   Procedure: BIOPSY;  Surgeon: [name], MD;  Location: [facility] ENDOSCOPY;  Service: Gastroenterology;;   BIOPSY  03/26/2021   Procedure: BIOPSY;  Surgeon: Lavena Bullion, DO;  Location: [place];  Service: Gastroenterology;;   BIOPSY  08/06/2021  Procedure: BIOPSY;  Surgeon: [name], MD;  Location: [place];  Service: Gastroenterology;;   BIOPSY  09/15/2021   Procedure: BIOPSY;  Surgeon: Sharyn Creamer, MD;  Location: [place];  Service: Gastroenterology;;   COLONOSCOPY   01/23/2012   Procedure: COLONOSCOPY;  Surgeon: Danie Binder, MD;  [facility];  Service: Endoscopy;  Laterality: N/A;  11:10 AM   COLONOSCOPY WITH PROPOFOL N/A 06/30/2019   Procedure: COLONOSCOPY WITH PROPOFOL;  Surgeon: Ronald Lobo, MD;  Location: [place];  Service: Endoscopy;  Laterality: N/A;   ENTEROSCOPY N/A 08/02/2019   Procedure: ENTEROSCOPY;  Surgeon: Yetta Flock, MD;  Location: [facility] ENDOSCOPY;  Service: Gastroenterology;  Laterality: N/A;   ENTEROSCOPY N/A 02/24/2021   Procedure: ENTEROSCOPY;  Surgeon: [name], MD;  Location: [facility] ENDOSCOPY;  Service: Gastroenterology;  Laterality: N/A;   ENTEROSCOPY N/A 03/26/2021   Procedure: ENTEROSCOPY;  Surgeon: Lavena Bullion, DO;  Location: [place];  Service: Gastroenterology;  Laterality: N/A;   ENTEROSCOPY N/A 07/05/2021   Procedure: ENTEROSCOPY;  Surgeon: Carol Ada, MD;  Location: [place];  Service: Gastroenterology;  Laterality: N/A;   ENTEROSCOPY N/A 08/06/2021   Procedure: ENTEROSCOPY;  Surgeon: [name], MD;  Location: [facility] ENDOSCOPY;  Service: Gastroenterology;  Laterality: N/A;   ENTEROSCOPY N/A 08/24/2021   Procedure: ENTEROSCOPY;  Surgeon: Ladene Artist, MD;  Location: [facility] ENDOSCOPY;  Service: Gastroenterology;  Laterality: N/A;   ENTEROSCOPY N/A 09/15/2021   Procedure: ENTEROSCOPY;  Surgeon: Sharyn Creamer, MD;  Location: [facility] ENDOSCOPY;  Service: Gastroenterology;  Laterality: N/A;   ESOPHAGOGASTRODUODENOSCOPY N/A 08/10/2020   Procedure: ESOPHAGOGASTRODUODENOSCOPY (EGD);  Surgeon: Jerene Bears, MD;  Location: [facility] ENDOSCOPY;  Service: Gastroenterology;  Laterality: N/A;   ESOPHAGOGASTRODUODENOSCOPY (EGD) WITH PROPOFOL N/A 06/30/2019   Procedure: ESOPHAGOGASTRODUODENOSCOPY (EGD) WITH PROPOFOL;  Surgeon: Ronald Lobo, MD;  Location: [place];  Service: Endoscopy;  Laterality: N/A;   ESOPHAGOGASTRODUODENOSCOPY (EGD) WITH PROPOFOL N/A 01/12/2021   Procedure:  ESOPHAGOGASTRODUODENOSCOPY (EGD) WITH PROPOFOL;  Surgeon: Sharyn Creamer, MD;  Location: [place];  Service: Gastroenterology;  Laterality: N/A;   ESOPHAGOGASTRODUODENOSCOPY (EGD) WITH PROPOFOL N/A 02/08/2021   Procedure: ESOPHAGOGASTRODUODENOSCOPY (EGD) WITH PROPOFOL;  Surgeon: Sharyn Creamer, MD;  Location: [place];  Service: Gastroenterology;  Laterality: N/A;   FISTULA SUPERFICIALIZATION Left 10/17/2019   Procedure: LEFT UPPER EXTREMITY FISTULA REVISION, SIDE BRANCH LIGATION,  AND SUPERFICIALIZATION;  Surgeon: Marty Heck, MD;  Location: [place];  Service: Vascular;  Laterality: Left;   FISTULA SUPERFICIALIZATION Left 78/29/5621   Procedure: PLICATION OF ANEURYSM LEFT ARTERIOVENOUS FISTULA;  Surgeon: Angelia Mould, MD;  Location: [place];  Service: Vascular;  Laterality: Left;   FLEXIBLE SIGMOIDOSCOPY N/A 07/05/2021   Procedure: FLEXIBLE SIGMOIDOSCOPY;  Surgeon: Carol Ada, MD;  Location: [place];  Service: Gastroenterology;  Laterality: N/A;   GIVENS CAPSULE STUDY N/A 06/30/2019   Procedure: GIVENS CAPSULE STUDY;  Surgeon: Ronald Lobo, MD;  Location: [place];  Service: Endoscopy;  Laterality: N/A;   GIVENS CAPSULE STUDY N/A 08/25/2021   Procedure: GIVENS CAPSULE STUDY;  Surgeon: Ladene Artist, MD;  Location: [place];  Service: Gastroenterology;  Laterality: N/A;   HEMOSTASIS CLIP PLACEMENT  08/10/2020   Procedure: HEMOSTASIS CLIP PLACEMENT;  Surgeon: Jerene Bears, MD;  Location: [place];  Service: Gastroenterology;;   HEMOSTASIS CLIP PLACEMENT  01/12/2021   Procedure: HEMOSTASIS CLIP PLACEMENT;  Surgeon: Sharyn Creamer, MD;  Location: [place];  Service: Gastroenterology;;   HEMOSTASIS CONTROL  08/02/2019   Procedure: HEMOSTASIS  CONTROL;  Surgeon: Yetta Flock, MD;  Location: [facility] ENDOSCOPY;  Service: Gastroenterology;;   HOT HEMOSTASIS  02/24/2021   Procedure: HOT HEMOSTASIS (ARGON PLASMA COAGULATION/BICAP);  Surgeon: [name], MD;  Location: [facility] ENDOSCOPY;  Service: Gastroenterology;;   HOT HEMOSTASIS N/A 03/26/2021   Procedure: HOT HEMOSTASIS (ARGON PLASMA COAGULATION/BICAP);  Surgeon: Lavena Bullion, DO;  Location: [facility] ENDOSCOPY;  Service: Gastroenterology;  Laterality: N/A;   HOT HEMOSTASIS N/A 07/05/2021   Procedure: HOT HEMOSTASIS (ARGON PLASMA COAGULATION/BICAP);  Surgeon: Carol Ada, MD;  Location: [place];  Service: Gastroenterology;  Laterality: N/A;   HOT HEMOSTASIS N/A 09/15/2021   Procedure: HOT HEMOSTASIS (ARGON PLASMA COAGULATION/BICAP);  Surgeon: Sharyn Creamer, MD;  Location: [place];  Service: Gastroenterology;  Laterality: N/A;   INCISION AND DRAINAGE ABSCESS N/A 06/29/2016   Procedure: INCISION AND DRAINAGE ABDOMINAL WALL ABSCESS;  Surgeon: Alphonsa Overall, MD;  Location: [facility];  Service: General;  Laterality: N/A;   INSERTION OF DIALYSIS CATHETER Right 08/03/2019   Procedure: INSERTION OF DIALYSIS CATHETER;  Surgeon: Waynetta Sandy, MD;  Location: [place];  Service: Vascular;  Laterality: Right;   INSERTION OF DIALYSIS CATHETER Right 10/22/2019   Procedure: INSERTION OF 23CM TUNNELED DIALYSIS CATHETER RIGHT INTERNAL JUGULAR;  Surgeon: Angelia Mould, MD;  Location: [place];  Service: Vascular;  Laterality: Right;   INSERTION OF DIALYSIS CATHETER Right 08/12/2020   Procedure: INSERTION OF Right internal Jugular TUNNELED  DIALYSIS CATHETER.;  Surgeon: Waynetta Sandy, MD;  Location: [place];  Service: Vascular;  Laterality: Right;   INSERTION OF DIALYSIS CATHETER N/A 04/19/2021   Procedure: INSERTION OF TUNNELED DIALYSIS CATHETER;  Surgeon: Angelia Mould, MD;  Location: [facility];  Service: Vascular;  Laterality: N/A;   IR RADIOLOGIST EVAL & MGMT  09/12/2021   Left heel surgery     LOWER EXTREMITY ANGIOGRAPHY N/A 07/08/2021   Procedure: Lower Extremity Angiography;  Surgeon: Cherre Robins, MD;  Location: [place] CV LAB;  Service: Cardiovascular;  Laterality:  N/A;   PENILE BIOPSY N/A 03/26/2020   Procedure: PENILE ULCER DEBRIDEMENT;  Surgeon: Remi Haggard, MD;  Location: [facility];  Service: Urology;  Laterality: N/A;  30 MINS   PERIPHERAL VASCULAR INTERVENTION Right 07/08/2021   Procedure: PERIPHERAL VASCULAR INTERVENTION;  Surgeon: Cherre Robins, MD;  Location: [place] [facility];  Service: Cardiovascular;  Laterality: Right;   SCLEROTHERAPY  01/12/2021   Procedure: SCLEROTHERAPY;  Surgeon: Sharyn Creamer, MD;  Location: [facility] ENDOSCOPY;  Service: Gastroenterology;;   Clide Deutscher  02/24/2021   Procedure: Clide Deutscher;  Surgeon: [name], MD;  Location: [place];  Service: Gastroenterology;;   SUBMUCOSAL TATTOO INJECTION  08/24/2021   Procedure: SUBMUCOSAL TATTOO INJECTION;  Surgeon: Ladene Artist, MD;  Location: [place];  Service: Gastroenterology;;   SUBMUCOSAL TATTOO INJECTION  09/15/2021   Procedure: SUBMUCOSAL TATTOO INJECTION;  Surgeon: Sharyn Creamer, MD;  Location: [facility] ENDOSCOPY;  Service: Gastroenterology;;    Prior to Admission medications   Medication Sig Start Date End Date Taking? Authorizing Provider  amLODipine (NORVASC) 5 MG tablet Take 1 tablet (5 mg total) by mouth daily. 09/18/21 12/17/21 Yes [place] Donnamarie Poag, DO  aspirin EC 81 MG tablet Take 1 tablet (81 mg total) by mouth daily. 09/18/21 12/17/21 Yes [place] Eric J, DO  atorvastatin (LIPITOR) 40 MG tablet Take 1 tablet (40 mg total) by mouth daily. 09/18/21 12/17/21 Yes [place] Eric J, DO  gabapentin (NEURONTIN) 100 MG capsule Take 1 capsule (100 mg total)  by mouth 3 (three) times daily. 09/18/21 12/17/21 Yes [place] Donnamarie Poag, DO  oxyCODONE-acetaminophen (PERCOCET) 5-325 MG tablet Take 1 tablet by mouth every 6 (six) hours as needed. Patient taking differently: Take 1 tablet by mouth every 6 (six) hours as needed for moderate pain. 09/25/21  Yes Milton Ferguson, MD  pantoprazole (PROTONIX) 40 MG tablet Take 1 tablet (40 mg total) by mouth 2 (two) times daily  before a meal. 09/18/21 12/17/21 Yes [place] Donnamarie Poag, DO  traZODone (DESYREL) 50 MG tablet Take 1 tablet (50 mg total) by mouth at bedtime as needed for sleep. 09/18/21 10/18/21 Yes [place] Eric J, DO  calcium acetate, Phos Binder, (PHOSLYRA) 667 MG/5ML SOLN Take 5 mLs (667 mg total) by mouth 3 (three) times daily with meals. Patient not taking: Reported on 10/05/2021 09/18/21 12/17/21  [place] Eric J, DO  cefdinir (OMNICEF) 300 MG capsule Take 1 capsule (300 mg total) by mouth every Monday, Wednesday, and Friday for 3 doses. Take after dialysis Patient not taking: Reported on 10/05/2021 09/30/21 10/05/21  Antonieta Pert, MD  Darbepoetin Alfa (ARANESP) 100 MCG/0.5ML SOSY injection Inject 0.5 mLs (100 mcg total) into the vein every 7 (seven) days for 8 doses. Patient not taking: Reported on 09/15/2021 08/19/21 10/08/21  Dana Allan I, MD  ivabradine (CORLANOR) 5 MG TABS tablet Take 1 tablet (5 mg total) by mouth 2 (two) times daily with a meal. Take 2 Tablets 90-120 minutes prior to Scan. Patient not taking: Reported on 10/05/2021 09/26/21   Warren Lacy, PA-C  metoprolol tartrate (LOPRESSOR) 100 MG tablet Take 1 tablet (100 mg total) by mouth 2 (two) times daily. Take 1 Tablet 90-120 minutes prior to scan. Patient not taking: Reported on 10/05/2021 09/26/21 12/25/21  Warren Lacy, PA-C  colchicine 0.6 MG tablet Take 0.5 tablets (0.3 mg total) by mouth 2 (two) times daily. 07/24/20 07/29/20  Noemi Chapel, MD  furosemide (LASIX) 40 MG tablet Take 1 tablet (40 mg total) by mouth daily. 07/25/19 02/09/20  Ladona Horns, MD    Current Facility-Administered Medications  Medication Dose Route Frequency Provider Last Rate Last Admin   0.9 %  sodium chloride infusion  10 mL/hr Intravenous Once [place], Wylder S, PA       fentaNYL (SUBLIMAZE) injection 50 mcg  50 mcg Intravenous Q2H PRN Tedd Sias, [place]       [START ON 10/08/2021] pantoprazole (PROTONIX) injection 40 mg  40 mg Intravenous Q12H [place], Wylder S,  PA       pantoprozole (PROTONIX) 80 mg /NS 100 mL infusion  8 mg/hr Intravenous Continuous Tedd Sias, [place]       Current Outpatient Medications  Medication Sig Dispense Refill   amLODipine (NORVASC) 5 MG tablet Take 1 tablet (5 mg total) by mouth daily. 30 tablet 2   aspirin EC 81 MG tablet Take 1 tablet (81 mg total) by mouth daily. 30 tablet 2   atorvastatin (LIPITOR) 40 MG tablet Take 1 tablet (40 mg total) by mouth daily. 30 tablet 2   gabapentin (NEURONTIN) 100 MG capsule Take 1 capsule (100 mg total) by mouth 3 (three) times daily. 90 capsule 2   oxyCODONE-acetaminophen (PERCOCET) 5-325 MG tablet Take 1 tablet by mouth every 6 (six) hours as needed. (Patient taking differently: Take 1 tablet by mouth every 6 (six) hours as needed for moderate pain.) 20 tablet 0   pantoprazole (PROTONIX) 40 MG tablet Take 1 tablet (40 mg total) by mouth 2 (two) times daily before a meal.  60 tablet 2   traZODone (DESYREL) 50 MG tablet Take 1 tablet (50 mg total) by mouth at bedtime as needed for sleep. 30 tablet 0   calcium acetate, Phos Binder, (PHOSLYRA) 667 MG/5ML SOLN Take 5 mLs (667 mg total) by mouth 3 (three) times daily with meals. (Patient not taking: Reported on 10/05/2021) 450 mL 2   cefdinir (OMNICEF) 300 MG capsule Take 1 capsule (300 mg total) by mouth every Monday, Wednesday, and Friday for 3 doses. Take after dialysis (Patient not taking: Reported on 10/05/2021) 3 capsule 0   Darbepoetin Alfa (ARANESP) 100 MCG/0.5ML SOSY injection Inject 0.5 mLs (100 mcg total) into the vein every 7 (seven) days for 8 doses. (Patient not taking: Reported on 09/15/2021) 4 mL 0   ivabradine (CORLANOR) 5 MG TABS tablet Take 1 tablet (5 mg total) by mouth 2 (two) times daily with a meal. Take 2 Tablets 90-120 minutes prior to Scan. (Patient not taking: Reported on 10/05/2021) 2 tablet 0   metoprolol tartrate (LOPRESSOR) 100 MG tablet Take 1 tablet (100 mg total) by mouth 2 (two) times daily. Take 1 Tablet 90-120  minutes prior to scan. (Patient not taking: Reported on 10/05/2021) 1 tablet 0    Allergies as of 10/05/2021 - Review Complete 10/05/2021  Allergen Reaction Noted   Seroquel [quetiapine] Other (See Comments) 04/22/2021   Dilaudid [hydromorphone hcl] Itching and Other (See Comments) 07/29/2020    Family History  Problem Relation Age of Onset   Colon cancer Neg Hx     Social History   Socioeconomic History   Marital status: Single    Spouse name: Not on file   Number of children: 2   Years of education: 12   Highest education level: 12th grade  Occupational History   Occupation: disabled  Tobacco Use   Smoking status: Every Day    Packs/day: 0.50    Years: 45.00    Total pack years: 22.50    Types: Cigarettes   Smokeless tobacco: Never   Tobacco comments:    Pt left before information given  Vaping Use   Vaping Use: Never used  Substance and Sexual Activity   Alcohol use: Not Currently   Drug use: Not Currently    Types: "Crack" cocaine    Comment: last in 2020   Sexual activity: Yes    Birth control/protection: None  Other Topics Concern   Not on file  Social History Narrative   Goes to dialysis M-W-F, [facility] using a wheelchair at present, waiting for prosthetic. Looking for handicapped housing for immediate move in.   Social Determinants of Health   Financial Resource Strain: Not on file  Food Insecurity: No Food Insecurity (08/29/2021)   Hunger Vital Sign    Worried About Running Out of Food in the Last Year: Never true    Ran Out of Food in the Last Year: Never true  Transportation Needs: No Transportation Needs (08/29/2021)   PRAPARE - Hydrologist (Medical): No    Lack of Transportation (Non-Medical): No  Physical Activity: Not on file  Stress: Not on file  Social Connections: Not on file  Intimate Partner Violence: Not on file    Review of Systems: ROS is O/W negative except as mentioned in HPI.  Physical Exam: Vital signs  in last 24 hours: Temp:  [98.3 F (36.8 C)] 98.3 F (36.8 C) (06/10 0645) Pulse Rate:  [88-93] 89 (06/10 1100) Resp:  [15-18] 15 (06/10 1100) BP: (139-143)/(81-84) 141/84 (  06/10 1100) SpO2:  [97 %-99 %] 99 % (06/10 1100) Weight:  [108.9 kg] 108.9 kg (06/10 1105)   General:  Alert, Well-developed, well-nourished, pleasant and cooperative in NAD Head:  Normocephalic and atraumatic. Eyes:  Sclera clear, no icterus.  Conjunctiva pink. Ears:  Normal auditory acuity. Mouth:  No deformity or lesions.   Lungs:  Clear throughout to auscultation.  No wheezes, crackles, or rhonchi.  Heart:  Regular rate and rhythm; no murmurs, clicks, rubs, or gallops. Abdomen:  Soft, non-distended.  BS present.  Non-tender.   Rectal:  Heme positive stool on exam.   Msk:  Symmetrical without gross deformities. Pulses:  Normal pulses noted. Extremities:  Left BKA.  Trace pitting edema in RLE. Neurologic:  Alert and oriented x 4;  grossly normal neurologically. Skin:  Intact without significant lesions or rashes. Psych:  Alert and cooperative. Normal mood and affect.  Lab Results: Recent Labs    10/03/21 0900 10/05/21 0729  WBC 5.3 4.8  HGB 9.0* 7.3*  HCT 26.7* 21.8*  PLT 142* 146*   BMET Recent Labs    10/03/21 0900 10/05/21 0729  NA 137 143  K 4.6 4.4  CL 96* 96*  CO2 22 28  GLUCOSE 97 96  BUN 46* 41*  CREATININE 8.09* 5.96*  CALCIUM 9.7 9.0   LFT Recent Labs    10/05/21 0729  PROT 7.5  ALBUMIN 2.3*  AST 46*  ALT 16  ALKPHOS 124  BILITOT 0.8   PT/INR Recent Labs    10/05/21 0729  LABPROT 14.0  INR 1.1   IMPRESSION:  17) 61 year old male with recurrent GI bleeding with acute on chronic anemia requiring numerous hospital admissions. Small bowel enteroscopy 08/24/2021 identified a few duodenal polyps and grade 1 esophageal varices as well as portal hypertensive gastropathy. Small bowel capsule endoscopy 4/30 with no overt cause for bleeding seen. He had continued melenic stools  with hemoglobin dropped to 5.9. Hg 6.2 -> transfused 1 unit of PRBCs 5/20 -> Hg 8.2 -> 7.5 -> today Hg 7.3 -> transfused 1 unit of PRBCs during dialysis. S/P small bowel enteroscopy 09/15/2021 identified 3 bleeding AVMs in the duodenum treated with APC, likely source of upper GI bleeding. Small esophageal varices, portal hypertensive gastropathy and a single gastric polyp were noted.  Has continued with dark stools since hospital discharge.  Hemoglobin down to 7.3 g.  Hemodynamically stable.  Heme positive on exam.  2) Cirrhosis with portal hypertension, chronic hepatitis C (untreated) with recent findings of [place] on MRI  3) ESRD on HD MWF   4) Recent nose bleed:  Does not sound large volume per his report.   PLAN: -PPI drip for now. -Only had small esophageal varices and portal hypertensive gastropathy, but will put octreotide in place for now. -EGD/enteroscopy with VCE on 6/11. -Monitor Hgb and transfuse prn.  Janett Billow D. Maycee Blasco  10/05/2021, 12:00 PM

## 2021-10-05 NOTE — ED Notes (Signed)
Admit provider at bedside 

## 2021-10-05 NOTE — Consult Note (Signed)
Referring Provider: Pati Gallo, ED PA Primary Care Physician:  [name], NP Primary Gastroenterologist:  Althia Forts, seen by Dr. Havery Moros first in our group as inpatient but has not followed up as outpatient  Reason for Consultation:  GI bleeding  HPI: Daniel [name] is a 61 y.o. male with a past medical history significant for hypertension, diabetes, PAD status post right SFA stenting 3/23 on Plavix, intracerebral hemorrhage, ESRD on dialysis MWF, chronic anemia, diverticulosis and recurrent GI bleeds/duodenal ulcer/small bowel AVMs as well as erosive esophagitis and hyperplastic duodenal polyps, who he presented to the hospital this morning with GI bleeding.    The patient explains that he has never stopped seeing melenic stool since being in the hospital last at the end of May (discharged on 5/24 after a stay for GI bleeding) or even prior to that.  Also complaining of shoulder pain.  No abdominal pain.  No nausea or vomiting but says that his appetite is not great.  Had a nosebleed but he says that it was not a large amount.   Previous GI procedures: Small bowel enteroscopy 09/15/2021  Identified 3 bleeding AVMs in the duodenum treated with APC, likely source of upper GI bleeding. Small esophageal varices, portal hypertensive gastropathy and a single gastric polyp were noted.   Pill capsule endoscopy 08/25/21 With duodenal polyps, no overt cause for bleeding symptoms noted, small red spot versus diminutive AVM in the bowl but no significant heme anywhere, dark green liquid secretions and stool throughout the bowel, nondiagnostic exam   Small bowel enteroscopy 08/24/2021 Examined portion of the jejunum normal, distal extent of the exam tattooed, few duodenal polyps, grade 1 esophageal varices, portal hypertensive gastropathy; plan to continue to hold Plavix, change Pantoprazole to 40 mg twice daily, continue Aspirin, possible bleeding from portal gastropathy, schedule VCE    Small bowel enteroscopy  08/06/2021: - Mildly severe esophagitis with no bleeding. Biopsied. Given localized circumferential inflammation, suspect pill esophagitis. - Gastritis. - Friable gastric mucosa. Biopsied. - Multiple duodenal polyps. Biopsied. These are most likely reactive/hyperplastic, not adenomatous. These polyps were friable with contact oozing, and thus may be a source of GI bleeding. No AVMs were seen on this examination. - The examined portion of the jejunum was normal.   FINAL MICROSCOPIC DIAGNOSIS:   A. DUODENUM, POLYPECTOMY:  Hyperplastic polyps with nonspecific inflammation in the stroma.  There are no diagnostic features of celiac disease.  Negative for adenomatous change, dysplasia and malignancy.   B. PYLORIS, POLYPECTOMY:  Benign hyperplastic gastric polyp with focal erosion of the surface  epithelium.  H. pylori are not identified.  Negative for dysplasia and malignancy.   C. ESOPHAGUS, BIOPSY:  Acute erosive esophagitis.  Microorganisms with morphologic features consistent with Candida species  are present.  Negative for dysplasia and malignancy.    Small bowel enteroscopy by Dr. Benson Norway 07/05/2021: - Normal esophagus. - Friable gastric mucosa. - A few recently bleeding angioectasias in the duodenum. Treated with a monopolar probe. - A single non-bleeding angiodysplastic lesion in the jejunum. Treated with a monopolar probe. - No specimens collected.   Flexible sigmoidoscopy 07/05/2021: - Blood in the rectum, in the recto-sigmoid colon and in the sigmoid colon. - No specimens collected. -Abdominal/pelvic CT angiogram was recommended if he developed a rapid lower GI bleeding.   Small bowel enteroscopy by Dr. Bryan Lemma 03/26/2021: -Normal esophagus.  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 -Mild, nonulcerative gastritis.  Biopsied. -Mild inflammation with nodularity in the duodenal bulb.  This  overall appeared improved from images  on the prior studies. -2 nonbleeding angiectasia's in the duodenum.  Treated with argon plasma coagulation.   EGD 08/10/2020: - Normal esophagus. - Gastritis. - Bleeding duodenal - Bleeding duodenal ulcer with a visible vessel. Clips were placed x 3 with apparent hemostasis. - Duodenitis with nodularity and polypoid mucosa. - No specimens collected.   Small bowel enteroscopy 08/02/2019: - Esophagogastric landmarks identified. - Normal esophagus. - Normal stomach. - Prominent ampulla without overt adenomatous changes. Biopsied. - A few benign appearing duodenal polyps. Biopsied, one suspected AVM treated as above. 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 Treated with argon plasma coagulation (APC). - A single non-bleeding angiodysplastic lesion in the proximal jejunum. Treated with argon plasma coagulation   EGD 06/30/2019: - No source of iron deficiency anemia or heme positive stoIonlp eantideonstcopically evident. - Normal larynx. - Normal esophagus. - Normal stomach. - Duodenal polyp vs. prominent papilla, benign-appearing. Biopsied.   Colonoscopy 06/30/2019: - No active bleeding or source of heme positive stool/anemia seen. - Diverticulosis in the sigmoid colon. - Probable old blood in the terminal ileum, raising the question of a small bowel source (given negative egd). - The distal rectum and anal verge are normal on retroflexion view. - No specimens collected.   Past Medical History:  Diagnosis Date   Anemia    Diabetes mellitus without complication [facility])    patient denies   Dialysis patient [facility])    End stage chronic kidney disease ([place])    Hypertension    ICH (intracerebral hemorrhage) ([place]) 05/20/2017   PAD (peripheral artery disease) ([facility])    Shoulder pain, left 06/28/2013    Past Surgical History:  Procedure Laterality Date   A/V FISTULAGRAM N/A 08/15/2020   Procedure: A/V FISTULAGRAM - Left Upper;  Surgeon: Cherre Robins, MD;   Location: [place] [facility];  Service: Cardiovascular;  Laterality: N/A;   ABDOMINAL AORTOGRAM W/LOWER EXTREMITY N/A 04/08/2021   Procedure: ABDOMINAL AORTOGRAM W/LOWER EXTREMITY;  Surgeon: Waynetta Sandy, MD;  Location: [place] CV LAB;  Service: Cardiovascular;  Laterality: N/A;   AMPUTATION Left 04/16/2021   Procedure: LEFT BELOW KNEE AMPUTATION;  Surgeon: Angelia Mould, MD;  Location: [place];  Service: Vascular;  Laterality: Left;   AMPUTATION Left 06/17/2021   Procedure: REVISION AMPUTATION BELOW KNEE;  Surgeon: Broadus John, MD;  Location: [place];  Service: Vascular;  Laterality: Left;   APPENDECTOMY     APPLICATION OF WOUND VAC Left 06/17/2021   Procedure: APPLICATION OF WOUND VAC;  Surgeon: Broadus John, MD;  Location: [place];  Service: Vascular;  Laterality: Left;   AV FISTULA PLACEMENT Left 08/03/2019   Procedure: LEFT ARM ARTERIOVENOUS (AV) CEPHALIC  FISTULA CREATION;  Surgeon: Waynetta Sandy, MD;  Location: [place];  Service: Vascular;  Laterality: Left;   BIOPSY  06/30/2019   Procedure: BIOPSY;  Surgeon: Ronald Lobo, MD;  Location: [facility] ENDOSCOPY;  Service: Endoscopy;;   BIOPSY  08/02/2019   Procedure: BIOPSY;  Surgeon: Yetta Flock, MD;  Location: [facility] ENDOSCOPY;  Service: Gastroenterology;;   BIOPSY  02/08/2021   Procedure: BIOPSY;  Surgeon: Sharyn Creamer, MD;  Location: [facility] ENDOSCOPY;  Service: Gastroenterology;;   BIOPSY  02/24/2021   Procedure: BIOPSY;  Surgeon: [name], MD;  Location: [facility] ENDOSCOPY;  Service: Gastroenterology;;   BIOPSY  03/26/2021   Procedure: BIOPSY;  Surgeon: Lavena Bullion, DO;  Location: [place];  Service: Gastroenterology;;   BIOPSY  08/06/2021  Procedure: BIOPSY;  Surgeon: [name], MD;  Location: [place];  Service: Gastroenterology;;   BIOPSY  09/15/2021   Procedure: BIOPSY;  Surgeon: Sharyn Creamer, MD;  Location: [place];  Service: Gastroenterology;;   COLONOSCOPY   01/23/2012   Procedure: COLONOSCOPY;  Surgeon: Danie Binder, MD;  [facility];  Service: Endoscopy;  Laterality: N/A;  11:10 AM   COLONOSCOPY WITH PROPOFOL N/A 06/30/2019   Procedure: COLONOSCOPY WITH PROPOFOL;  Surgeon: Ronald Lobo, MD;  Location: [place];  Service: Endoscopy;  Laterality: N/A;   ENTEROSCOPY N/A 08/02/2019   Procedure: ENTEROSCOPY;  Surgeon: Yetta Flock, MD;  Location: [facility] ENDOSCOPY;  Service: Gastroenterology;  Laterality: N/A;   ENTEROSCOPY N/A 02/24/2021   Procedure: ENTEROSCOPY;  Surgeon: [name], MD;  Location: [facility] ENDOSCOPY;  Service: Gastroenterology;  Laterality: N/A;   ENTEROSCOPY N/A 03/26/2021   Procedure: ENTEROSCOPY;  Surgeon: Lavena Bullion, DO;  Location: [place];  Service: Gastroenterology;  Laterality: N/A;   ENTEROSCOPY N/A 07/05/2021   Procedure: ENTEROSCOPY;  Surgeon: Carol Ada, MD;  Location: [place];  Service: Gastroenterology;  Laterality: N/A;   ENTEROSCOPY N/A 08/06/2021   Procedure: ENTEROSCOPY;  Surgeon: [name], MD;  Location: [facility] ENDOSCOPY;  Service: Gastroenterology;  Laterality: N/A;   ENTEROSCOPY N/A 08/24/2021   Procedure: ENTEROSCOPY;  Surgeon: Ladene Artist, MD;  Location: [facility] ENDOSCOPY;  Service: Gastroenterology;  Laterality: N/A;   ENTEROSCOPY N/A 09/15/2021   Procedure: ENTEROSCOPY;  Surgeon: Sharyn Creamer, MD;  Location: [facility] ENDOSCOPY;  Service: Gastroenterology;  Laterality: N/A;   ESOPHAGOGASTRODUODENOSCOPY N/A 08/10/2020   Procedure: ESOPHAGOGASTRODUODENOSCOPY (EGD);  Surgeon: Jerene Bears, MD;  Location: [facility] ENDOSCOPY;  Service: Gastroenterology;  Laterality: N/A;   ESOPHAGOGASTRODUODENOSCOPY (EGD) WITH PROPOFOL N/A 06/30/2019   Procedure: ESOPHAGOGASTRODUODENOSCOPY (EGD) WITH PROPOFOL;  Surgeon: Ronald Lobo, MD;  Location: [place];  Service: Endoscopy;  Laterality: N/A;   ESOPHAGOGASTRODUODENOSCOPY (EGD) WITH PROPOFOL N/A 01/12/2021   Procedure:  ESOPHAGOGASTRODUODENOSCOPY (EGD) WITH PROPOFOL;  Surgeon: Sharyn Creamer, MD;  Location: [name];  Service: Gastroenterology;  Laterality: N/A;   ESOPHAGOGASTRODUODENOSCOPY (EGD) WITH PROPOFOL N/A 02/08/2021   Procedure: ESOPHAGOGASTRODUODENOSCOPY (EGD) WITH PROPOFOL;  Surgeon: Sharyn Creamer, MD;  Location: [place];  Service: Gastroenterology;  Laterality: N/A;   FISTULA SUPERFICIALIZATION Left 10/17/2019   Procedure: LEFT UPPER EXTREMITY FISTULA REVISION, SIDE BRANCH LIGATION,  AND SUPERFICIALIZATION;  Surgeon: Marty Heck, MD;  Location: [place];  Service: Vascular;  Laterality: Left;   FISTULA SUPERFICIALIZATION Left 15/40/0867   Procedure: PLICATION OF ANEURYSM LEFT ARTERIOVENOUS FISTULA;  Surgeon: Angelia Mould, MD;  Location: [place];  Service: Vascular;  Laterality: Left;   FLEXIBLE SIGMOIDOSCOPY N/A 07/05/2021   Procedure: FLEXIBLE SIGMOIDOSCOPY;  Surgeon: Carol Ada, MD;  Location: [place];  Service: Gastroenterology;  Laterality: N/A;   GIVENS CAPSULE STUDY N/A 06/30/2019   Procedure: GIVENS CAPSULE STUDY;  Surgeon: Ronald Lobo, MD;  Location: [place];  Service: Endoscopy;  Laterality: N/A;   GIVENS CAPSULE STUDY N/A 08/25/2021   Procedure: GIVENS CAPSULE STUDY;  Surgeon: Ladene Artist, MD;  Location: [place];  Service: Gastroenterology;  Laterality: N/A;   HEMOSTASIS CLIP PLACEMENT  08/10/2020   Procedure: HEMOSTASIS CLIP PLACEMENT;  Surgeon: Jerene Bears, MD;  Location: [place];  Service: Gastroenterology;;   HEMOSTASIS CLIP PLACEMENT  01/12/2021   Procedure: HEMOSTASIS CLIP PLACEMENT;  Surgeon: Sharyn Creamer, MD;  Location: [place];  Service: Gastroenterology;;   HEMOSTASIS CONTROL  08/02/2019   Procedure: HEMOSTASIS  CONTROL;  Surgeon: Yetta Flock, MD;  Location: [facility] ENDOSCOPY;  Service: Gastroenterology;;   HOT HEMOSTASIS  02/24/2021   Procedure: HOT HEMOSTASIS (ARGON PLASMA COAGULATION/BICAP);  Surgeon: [name], MD;  Location: [facility] ENDOSCOPY;  Service: Gastroenterology;;   HOT HEMOSTASIS N/A 03/26/2021   Procedure: HOT HEMOSTASIS (ARGON PLASMA COAGULATION/BICAP);  Surgeon: Lavena Bullion, DO;  Location: [facility] ENDOSCOPY;  Service: Gastroenterology;  Laterality: N/A;   HOT HEMOSTASIS N/A 07/05/2021   Procedure: HOT HEMOSTASIS (ARGON PLASMA COAGULATION/BICAP);  Surgeon: Carol Ada, MD;  Location: [place];  Service: Gastroenterology;  Laterality: N/A;   HOT HEMOSTASIS N/A 09/15/2021   Procedure: HOT HEMOSTASIS (ARGON PLASMA COAGULATION/BICAP);  Surgeon: Sharyn Creamer, MD;  Location: [place];  Service: Gastroenterology;  Laterality: N/A;   INCISION AND DRAINAGE ABSCESS N/A 06/29/2016   Procedure: INCISION AND DRAINAGE ABDOMINAL WALL ABSCESS;  Surgeon: Alphonsa Overall, MD;  Location: [facility];  Service: General;  Laterality: N/A;   INSERTION OF DIALYSIS CATHETER Right 08/03/2019   Procedure: INSERTION OF DIALYSIS CATHETER;  Surgeon: Waynetta Sandy, MD;  Location: [place];  Service: Vascular;  Laterality: Right;   INSERTION OF DIALYSIS CATHETER Right 10/22/2019   Procedure: INSERTION OF 23CM TUNNELED DIALYSIS CATHETER RIGHT INTERNAL JUGULAR;  Surgeon: Angelia Mould, MD;  Location: [place];  Service: Vascular;  Laterality: Right;   INSERTION OF DIALYSIS CATHETER Right 08/12/2020   Procedure: INSERTION OF Right internal Jugular TUNNELED  DIALYSIS CATHETER.;  Surgeon: Waynetta Sandy, MD;  Location: [place];  Service: Vascular;  Laterality: Right;   INSERTION OF DIALYSIS CATHETER N/A 04/19/2021   Procedure: INSERTION OF TUNNELED DIALYSIS CATHETER;  Surgeon: Angelia Mould, MD;  Location: [facility];  Service: Vascular;  Laterality: N/A;   IR RADIOLOGIST EVAL & MGMT  09/12/2021   Left heel surgery     LOWER EXTREMITY ANGIOGRAPHY N/A 07/08/2021   Procedure: Lower Extremity Angiography;  Surgeon: Cherre Robins, MD;  Location: [place] CV LAB;  Service: Cardiovascular;  Laterality:  N/A;   PENILE BIOPSY N/A 03/26/2020   Procedure: PENILE ULCER DEBRIDEMENT;  Surgeon: Remi Haggard, MD;  Location: [facility];  Service: Urology;  Laterality: N/A;  30 MINS   PERIPHERAL VASCULAR INTERVENTION Right 07/08/2021   Procedure: PERIPHERAL VASCULAR INTERVENTION;  Surgeon: Cherre Robins, MD;  Location: [place] [facility];  Service: Cardiovascular;  Laterality: Right;   SCLEROTHERAPY  01/12/2021   Procedure: SCLEROTHERAPY;  Surgeon: Sharyn Creamer, MD;  Location: [facility] ENDOSCOPY;  Service: Gastroenterology;;   Clide Deutscher  02/24/2021   Procedure: Clide Deutscher;  Surgeon: [name], MD;  Location: [place];  Service: Gastroenterology;;   SUBMUCOSAL TATTOO INJECTION  08/24/2021   Procedure: SUBMUCOSAL TATTOO INJECTION;  Surgeon: Ladene Artist, MD;  Location: [place];  Service: Gastroenterology;;   SUBMUCOSAL TATTOO INJECTION  09/15/2021   Procedure: SUBMUCOSAL TATTOO INJECTION;  Surgeon: Sharyn Creamer, MD;  Location: [facility] ENDOSCOPY;  Service: Gastroenterology;;    Prior to Admission medications   Medication Sig Start Date End Date Taking? Authorizing Provider  amLODipine (NORVASC) 5 MG tablet Take 1 tablet (5 mg total) by mouth daily. 09/18/21 12/17/21 Yes [place] Donnamarie Poag, DO  aspirin EC 81 MG tablet Take 1 tablet (81 mg total) by mouth daily. 09/18/21 12/17/21 Yes [place] Eric J, DO  atorvastatin (LIPITOR) 40 MG tablet Take 1 tablet (40 mg total) by mouth daily. 09/18/21 12/17/21 Yes [place] Eric J, DO  gabapentin (NEURONTIN) 100 MG capsule Take 1 capsule (100 mg total)  by mouth 3 (three) times daily. 09/18/21 12/17/21 Yes [place] Donnamarie Poag, DO  oxyCODONE-acetaminophen (PERCOCET) 5-325 MG tablet Take 1 tablet by mouth every 6 (six) hours as needed. Patient taking differently: Take 1 tablet by mouth every 6 (six) hours as needed for moderate pain. 09/25/21  Yes Milton Ferguson, MD  pantoprazole (PROTONIX) 40 MG tablet Take 1 tablet (40 mg total) by mouth 2 (two) times daily  before a meal. 09/18/21 12/17/21 Yes [place] Donnamarie Poag, DO  traZODone (DESYREL) 50 MG tablet Take 1 tablet (50 mg total) by mouth at bedtime as needed for sleep. 09/18/21 10/18/21 Yes [place] Eric J, DO  calcium acetate, Phos Binder, (PHOSLYRA) 667 MG/5ML SOLN Take 5 mLs (667 mg total) by mouth 3 (three) times daily with meals. Patient not taking: Reported on 10/05/2021 09/18/21 12/17/21  [place] Eric J, DO  cefdinir (OMNICEF) 300 MG capsule Take 1 capsule (300 mg total) by mouth every Monday, Wednesday, and Friday for 3 doses. Take after dialysis Patient not taking: Reported on 10/05/2021 09/30/21 10/05/21  Antonieta Pert, MD  Darbepoetin Alfa (ARANESP) 100 MCG/0.5ML SOSY injection Inject 0.5 mLs (100 mcg total) into the vein every 7 (seven) days for 8 doses. Patient not taking: Reported on 09/15/2021 08/19/21 10/08/21  Dana Allan I, MD  ivabradine (CORLANOR) 5 MG TABS tablet Take 1 tablet (5 mg total) by mouth 2 (two) times daily with a meal. Take 2 Tablets 90-120 minutes prior to Scan. Patient not taking: Reported on 10/05/2021 09/26/21   Warren Lacy, PA-C  metoprolol tartrate (LOPRESSOR) 100 MG tablet Take 1 tablet (100 mg total) by mouth 2 (two) times daily. Take 1 Tablet 90-120 minutes prior to scan. Patient not taking: Reported on 10/05/2021 09/26/21 12/25/21  Warren Lacy, PA-C  colchicine 0.6 MG tablet Take 0.5 tablets (0.3 mg total) by mouth 2 (two) times daily. 07/24/20 07/29/20  Noemi Chapel, MD  furosemide (LASIX) 40 MG tablet Take 1 tablet (40 mg total) by mouth daily. 07/25/19 02/09/20  Ladona Horns, MD    Current Facility-Administered Medications  Medication Dose Route Frequency Provider Last Rate Last Admin   0.9 %  sodium chloride infusion  10 mL/hr Intravenous Once [place], Wylder S, PA       fentaNYL (SUBLIMAZE) injection 50 mcg  50 mcg Intravenous Q2H PRN Tedd Sias, [place]       [START ON 10/08/2021] pantoprazole (PROTONIX) injection 40 mg  40 mg Intravenous Q12H [place], Wylder S,  PA       pantoprozole (PROTONIX) 80 mg /NS 100 mL infusion  8 mg/hr Intravenous Continuous Tedd Sias, [place]       Current Outpatient Medications  Medication Sig Dispense Refill   amLODipine (NORVASC) 5 MG tablet Take 1 tablet (5 mg total) by mouth daily. 30 tablet 2   aspirin EC 81 MG tablet Take 1 tablet (81 mg total) by mouth daily. 30 tablet 2   atorvastatin (LIPITOR) 40 MG tablet Take 1 tablet (40 mg total) by mouth daily. 30 tablet 2   gabapentin (NEURONTIN) 100 MG capsule Take 1 capsule (100 mg total) by mouth 3 (three) times daily. 90 capsule 2   oxyCODONE-acetaminophen (PERCOCET) 5-325 MG tablet Take 1 tablet by mouth every 6 (six) hours as needed. (Patient taking differently: Take 1 tablet by mouth every 6 (six) hours as needed for moderate pain.) 20 tablet 0   pantoprazole (PROTONIX) 40 MG tablet Take 1 tablet (40 mg total) by mouth 2 (two) times daily before a meal.  60 tablet 2   traZODone (DESYREL) 50 MG tablet Take 1 tablet (50 mg total) by mouth at bedtime as needed for sleep. 30 tablet 0   calcium acetate, Phos Binder, (PHOSLYRA) 667 MG/5ML SOLN Take 5 mLs (667 mg total) by mouth 3 (three) times daily with meals. (Patient not taking: Reported on 10/05/2021) 450 mL 2   cefdinir (OMNICEF) 300 MG capsule Take 1 capsule (300 mg total) by mouth every Monday, Wednesday, and Friday for 3 doses. Take after dialysis (Patient not taking: Reported on 10/05/2021) 3 capsule 0   Darbepoetin Alfa (ARANESP) 100 MCG/0.5ML SOSY injection Inject 0.5 mLs (100 mcg total) into the vein every 7 (seven) days for 8 doses. (Patient not taking: Reported on 09/15/2021) 4 mL 0   ivabradine (CORLANOR) 5 MG TABS tablet Take 1 tablet (5 mg total) by mouth 2 (two) times daily with a meal. Take 2 Tablets 90-120 minutes prior to Scan. (Patient not taking: Reported on 10/05/2021) 2 tablet 0   metoprolol tartrate (LOPRESSOR) 100 MG tablet Take 1 tablet (100 mg total) by mouth 2 (two) times daily. Take 1 Tablet 90-120  minutes prior to scan. (Patient not taking: Reported on 10/05/2021) 1 tablet 0    Allergies as of 10/05/2021 - Review Complete 10/05/2021  Allergen Reaction Noted   Seroquel [quetiapine] Other (See Comments) 04/22/2021   Dilaudid [hydromorphone hcl] Itching and Other (See Comments) 07/29/2020    Family History  Problem Relation Age of Onset   Colon cancer Neg Hx     Social History   Socioeconomic History   Marital status: Single    Spouse name: Not on file   Number of children: 2   Years of education: 12   Highest education level: 12th grade  Occupational History   Occupation: disabled  Tobacco Use   Smoking status: Every Day    Packs/day: 0.50    Years: 45.00    Total pack years: 22.50    Types: Cigarettes   Smokeless tobacco: Never   Tobacco comments:    Pt left before information given  Vaping Use   Vaping Use: Never used  Substance and Sexual Activity   Alcohol use: Not Currently   Drug use: Not Currently    Types: "Crack" cocaine    Comment: last in 2020   Sexual activity: Yes    Birth control/protection: None  Other Topics Concern   Not on file  Social History Narrative   Goes to dialysis M-W-F, [facility] using a wheelchair at present, waiting for prosthetic. Looking for handicapped housing for immediate move in.   Social Determinants of Health   Financial Resource Strain: Not on file  Food Insecurity: No Food Insecurity (08/29/2021)   Hunger Vital Sign    Worried About Running Out of Food in the Last Year: Never true    Ran Out of Food in the Last Year: Never true  Transportation Needs: No Transportation Needs (08/29/2021)   PRAPARE - Hydrologist (Medical): No    Lack of Transportation (Non-Medical): No  Physical Activity: Not on file  Stress: Not on file  Social Connections: Not on file  Intimate Partner Violence: Not on file    Review of Systems: ROS is O/W negative except as mentioned in HPI.  Physical Exam: Vital signs  in last 24 hours: Temp:  [98.3 F (36.8 C)] 98.3 F (36.8 C) (06/10 0645) Pulse Rate:  [88-93] 89 (06/10 1100) Resp:  [15-18] 15 (06/10 1100) BP: (139-143)/(81-84) 141/84 (  06/10 1100) SpO2:  [97 %-99 %] 99 % (06/10 1100) Weight:  [108.9 kg] 108.9 kg (06/10 1105)   General:  Alert, Well-developed, well-nourished, pleasant and cooperative in NAD Head:  Normocephalic and atraumatic. Eyes:  Sclera clear, no icterus.  Conjunctiva pink. Ears:  Normal auditory acuity. Mouth:  No deformity or lesions.   Lungs:  Clear throughout to auscultation.  No wheezes, crackles, or rhonchi.  Heart:  Regular rate and rhythm; no murmurs, clicks, rubs, or gallops. Abdomen:  Soft, non-distended.  BS present.  Non-tender.   Rectal:  Heme positive stool on exam.   Msk:  Symmetrical without gross deformities. Pulses:  Normal pulses noted. Extremities:  Left BKA.  Trace pitting edema in RLE. Neurologic:  Alert and oriented x 4;  grossly normal neurologically. Skin:  Intact without significant lesions or rashes. Psych:  Alert and cooperative. Normal mood and affect.  Lab Results: Recent Labs    10/03/21 0900 10/05/21 0729  WBC 5.3 4.8  HGB 9.0* 7.3*  HCT 26.7* 21.8*  PLT 142* 146*   BMET Recent Labs    10/03/21 0900 10/05/21 0729  NA 137 143  K 4.6 4.4  CL 96* 96*  CO2 22 28  GLUCOSE 97 96  BUN 46* 41*  CREATININE 8.09* 5.96*  CALCIUM 9.7 9.0   LFT Recent Labs    10/05/21 0729  PROT 7.5  ALBUMIN 2.3*  AST 46*  ALT 16  ALKPHOS 124  BILITOT 0.8   PT/INR Recent Labs    10/05/21 0729  LABPROT 14.0  INR 1.1   IMPRESSION:  28) 61 year old male with recurrent GI bleeding with acute on chronic anemia requiring numerous hospital admissions. Small bowel enteroscopy 08/24/2021 identified a few duodenal polyps and grade 1 esophageal varices as well as portal hypertensive gastropathy. Small bowel capsule endoscopy 4/30 with no overt cause for bleeding seen. He had continued melenic stools  with hemoglobin dropped to 5.9. Hg 6.2 -> transfused 1 unit of PRBCs 5/20 -> Hg 8.2 -> 7.5 -> today Hg 7.3 -> transfused 1 unit of PRBCs during dialysis. S/P small bowel enteroscopy 09/15/2021 identified 3 bleeding AVMs in the duodenum treated with APC, likely source of upper GI bleeding. Small esophageal varices, portal hypertensive gastropathy and a single gastric polyp were noted.  Has continued with dark stools since hospital discharge.  Hemoglobin down to 7.3 g.  Hemodynamically stable.  Heme positive on exam.  2) Cirrhosis with portal hypertension, chronic hepatitis C (untreated) with recent findings of [place] on MRI  3) ESRD on HD MWF   4) Recent nose bleed:  Does not sound large volume per his report.   PLAN: -PPI drip for now. -Only had small esophageal varices and portal hypertensive gastropathy, but will put octreotide in place for now. -EGD/enteroscopy with VCE on 6/11. -Monitor Hgb and transfuse prn.  Daniel [name]  10/05/2021, 12:00 PM

## 2021-10-05 NOTE — ED Notes (Signed)
IV team at bedside 

## 2021-10-05 NOTE — ED Triage Notes (Signed)
Pt reported to ED with c/o dark, bloody stools x2 weeks. Pt also states he is having pain to bilateral shoulders. States that pain has occurred intermittently. Pt is poor historian at time of triage. Does not know whether or not he takes blood thinning medications. Denies any chest pain or shortness of breath. Pt also states his nose bleeds occasionally.

## 2021-10-05 NOTE — ED Notes (Signed)
Transport request placed.

## 2021-10-05 NOTE — ED Notes (Signed)
Called reference purple man advised charge nurse looking at chart give her 10 min

## 2021-10-05 NOTE — ED Notes (Signed)
Pt moved to room 39 at this time. Received report from Jory Sims RN at this time

## 2021-10-05 NOTE — ED Provider Notes (Signed)
[facility] EMERGENCY DEPARTMENT Provider Note   CSN: [id number] Arrival date & time: 10/05/21  0623     History  Chief Complaint  Patient presents with   Rectal Bleeding   Shoulder Pain    Daniel [name] is a 61 y.o. male.   Rectal Bleeding Shoulder Pain Patient is a 61 year old male with a extensive past medical history presented emergency room today with complaints of continued dark stool states that he has been somewhat more fatigued and occasionally lightheaded but denies any difficulty breathing.  Seems that he left the hospital 6/4 and since then has continued to have black stool.  He states he has had some more frequent episodes of BMs over the past 2 days.  He states that he is also continuing to have the upper back and shoulder pain that he had during his previous hospitalization.  Has a history of GI bleeds, chronic anemia, small bowel ulcers and AVMs as well as hypertensive gastropathy and esophageal varices.   NO CP or SOB.       Home Medications Prior to Admission medications   Medication Sig Start Date End Date Taking? Authorizing Provider  amLODipine (NORVASC) 5 MG tablet Take 1 tablet (5 mg total) by mouth daily. 09/18/21 12/17/21 Yes [place] Donnamarie Poag, DO  aspirin EC 81 MG tablet Take 1 tablet (81 mg total) by mouth daily. 09/18/21 12/17/21 Yes [place] Eric J, DO  atorvastatin (LIPITOR) 40 MG tablet Take 1 tablet (40 mg total) by mouth daily. 09/18/21 12/17/21 Yes [place] Eric J, DO  gabapentin (NEURONTIN) 100 MG capsule Take 1 capsule (100 mg total) by mouth 3 (three) times daily. 09/18/21 12/17/21 Yes [place] Donnamarie Poag, DO  oxyCODONE-acetaminophen (PERCOCET) 5-325 MG tablet Take 1 tablet by mouth every 6 (six) hours as needed. Patient taking differently: Take 1 tablet by mouth every 6 (six) hours as needed for moderate pain. 09/25/21  Yes Milton Ferguson, MD  pantoprazole (PROTONIX) 40 MG tablet Take 1 tablet (40 mg total) by mouth 2 (two) times  daily before a meal. 09/18/21 12/17/21 Yes [place] Donnamarie Poag, DO  traZODone (DESYREL) 50 MG tablet Take 1 tablet (50 mg total) by mouth at bedtime as needed for sleep. 09/18/21 10/18/21 Yes [place] Eric J, DO  calcium acetate, Phos Binder, (PHOSLYRA) 667 MG/5ML SOLN Take 5 mLs (667 mg total) by mouth 3 (three) times daily with meals. Patient not taking: Reported on 10/05/2021 09/18/21 12/17/21  [place] Eric J, DO  cefdinir (OMNICEF) 300 MG capsule Take 1 capsule (300 mg total) by mouth every Monday, Wednesday, and Friday for 3 doses. Take after dialysis Patient not taking: Reported on 10/05/2021 09/30/21 10/05/21  Antonieta Pert, MD  Darbepoetin Alfa (ARANESP) 100 MCG/0.5ML SOSY injection Inject 0.5 mLs (100 mcg total) into the vein every 7 (seven) days for 8 doses. Patient not taking: Reported on 09/15/2021 08/19/21 10/08/21  Dana Allan I, MD  ivabradine (CORLANOR) 5 MG TABS tablet Take 1 tablet (5 mg total) by mouth 2 (two) times daily with a meal. Take 2 Tablets 90-120 minutes prior to Scan. Patient not taking: Reported on 10/05/2021 09/26/21   Warren Lacy, PA-C  metoprolol tartrate (LOPRESSOR) 100 MG tablet Take 1 tablet (100 mg total) by mouth 2 (two) times daily. Take 1 Tablet 90-120 minutes prior to scan. Patient not taking: Reported on 10/05/2021 09/26/21 12/25/21  Warren Lacy, PA-C  colchicine 0.6 MG tablet Take 0.5 tablets (0.3 mg total) by mouth 2 (two) times daily. 07/24/20 07/29/20  Noemi Chapel, MD  furosemide (LASIX) 40 MG tablet Take 1 tablet (40 mg total) by mouth daily. 07/25/19 02/09/20  Ladona Horns, MD      Allergies    Seroquel [quetiapine] and Dilaudid [hydromorphone hcl]    Review of Systems   Review of Systems  Gastrointestinal:  Positive for hematochezia.    Physical Exam Updated Vital Signs BP 106/76   Pulse 90   Temp 97.7 F (36.5 C) (Oral)   Resp 20   Ht '5\' 11"'$  (1.803 m)   Wt 108.9 kg   SpO2 99%   BMI 33.47 kg/m  Physical Exam Vitals and nursing note  reviewed.  Constitutional:      General: He is not in acute distress.    Comments: Chronically ill-appearing  HENT:     Head: Normocephalic and atraumatic.     Nose: Nose normal.  Eyes:     General: No scleral icterus. Cardiovascular:     Rate and Rhythm: Normal rate and regular rhythm.     Pulses: Normal pulses.     Heart sounds: Normal heart sounds.  Pulmonary:     Effort: Pulmonary effort is normal. No respiratory distress.     Breath sounds: No wheezing.  Abdominal:     Palpations: Abdomen is soft.     Tenderness: There is no abdominal tenderness. There is no guarding or rebound.  Musculoskeletal:     Cervical back: Normal range of motion.     Comments: Left BKA  Skin:    General: Skin is warm and dry.     Capillary Refill: Capillary refill takes less than 2 seconds.  Neurological:     Mental Status: He is alert. Mental status is at baseline.  Psychiatric:        Mood and Affect: Mood normal.        Behavior: Behavior normal.     ED Results / Procedures / Treatments   Labs (all labs ordered are listed, but only abnormal results are displayed) Labs Reviewed  COMPREHENSIVE METABOLIC PANEL - Abnormal; Notable for the following components:      Result Value   Chloride 96 (*)    BUN 41 (*)    Creatinine, Ser 5.96 (*)    Albumin 2.3 (*)    AST 46 (*)    GFR, Estimated 10 (*)    Anion gap 19 (*)    All other components within normal limits  CBC - Abnormal; Notable for the following components:   RBC 2.22 (*)    Hemoglobin 7.3 (*)    HCT 21.8 (*)    RDW 19.7 (*)    Platelets 146 (*)    All other components within normal limits  POC OCCULT BLOOD, ED - Abnormal; Notable for the following components:   Fecal Occult Bld POSITIVE (*)    All other components within normal limits  PROTIME-INR  HEMOGLOBIN AND HEMATOCRIT, BLOOD  HEMOGLOBIN AND HEMATOCRIT, BLOOD  VITAMIN B12  FOLATE  IRON AND TIBC  FERRITIN  RETICULOCYTES  RAPID URINE DRUG SCREEN, HOSP PERFORMED   TYPE AND SCREEN  PREPARE RBC (CROSSMATCH)    EKG None  Radiology No results found.  Procedures .Critical Care  Performed by: Tedd Sias, PA Authorized by: Tedd Sias, PA   Critical care provider statement:    Critical care time (minutes):  35   Critical care time was exclusive of:  Separately billable procedures and treating other patients and teaching time   Critical care was necessary to  treat or prevent imminent or life-threatening deterioration of the following conditions: Symptomatic anemia requiring blood transfusion and admission.   Critical care was time spent personally by me on the following activities:  Development of treatment plan with patient or surrogate, review of old charts, re-evaluation of patient's condition, pulse oximetry, ordering and review of radiographic studies, ordering and review of laboratory studies, ordering and performing treatments and interventions, obtaining history from patient or surrogate, examination of patient and evaluation of patient's response to treatment   Care discussed with: admitting provider       Medications Ordered in ED Medications  pantoprozole (PROTONIX) 80 mg /NS 100 mL infusion (8 mg/hr Intravenous New Bag/Given 10/05/21 1246)  pantoprazole (PROTONIX) injection 40 mg (has no administration in time range)  octreotide (SANDOSTATIN) 2 mcg/mL load via infusion 50 mcg (50 mcg Intravenous Not Given 10/05/21 1246)    And  octreotide (SANDOSTATIN) 500 mcg in sodium chloride 0.9 % 250 mL (2 mcg/mL) infusion (50 mcg/hr Intravenous Not Given 10/05/21 1247)  oxyCODONE-acetaminophen (PERCOCET/ROXICET) 5-325 MG per tablet 1 tablet (has no administration in time range)  HYDROmorphone (DILAUDID) injection 0.5 mg (has no administration in time range)  acetaminophen (TYLENOL) tablet 650 mg (has no administration in time range)    Or  acetaminophen (TYLENOL) suppository 650 mg (has no administration in time range)  nicotine  (NICODERM CQ - dosed in mg/24 hours) patch 14 mg (has no administration in time range)  0.9 %  sodium chloride infusion (0 mL/hr Intravenous Paused 10/05/21 1230)    ED Course/ Medical Decision Making/ A&P Clinical Course as of 10/05/21 1312  Sat Oct 05, 2021  1037 10 days or so of black stool.  Nose bleeding  [WF]  1040 MWF dialysis full session Friday  [WF]  4967 Discussed with Jessica of [facility] who will see in consult. PPI bolus/drip. Admit to medicine.  [WF]  1259 Hemoglobin(!): 7.3 [WF]    Clinical Course User Index [WF] Tedd Sias, PA                           Medical Decision Making Amount and/or Complexity of Data Reviewed Labs: ordered.  Risk Prescription drug management. Decision regarding hospitalization.   This patient presents to the ED for concern of LH, fatigue, GI bleed, this involves a number of treatment options, and is a complaint that carries with it a high risk of complications and morbidity.     Co morbidities: Discussed in HPI   Brief History:  Patient is a 61 year old male with a extensive past medical history presented emergency room today with complaints of continued dark stool states that he has been somewhat more fatigued and occasionally lightheaded but denies any difficulty breathing.  Seems that he left the hospital 6/4 and since then has continued to have black stool.  He states he has had some more frequent episodes of BMs over the past 2 days.  He states that he is also continuing to have the upper back and shoulder pain that he had during his previous hospitalization.  Has a history of GI bleeds, chronic anemia, small bowel ulcers and AVMs as well as hypertensive gastropathy and esophageal varices.   NO CP or SOB.     EMR reviewed including pt PMHx, past surgical history and past visits to ER.   See HPI for more details   Lab Tests:   I ordered and independently interpreted labs. Labs notable for severe anemia hemoglobin  7.3  has decreased from last hemoglobin in the hospital 6 days ago 11.3.  He did have a repeat hemoglobin done 2 days ago that was 9.  BUN elevated however in the setting of CKD on dialysis.  Fecal occult positive with maroon stool on DRE.  Type and screen obtained.  INR within normal limits.   Imaging Studies:  No imaging studies ordered for this patient    Cardiac Monitoring:  The patient was maintained on a cardiac monitor.  I personally viewed and interpreted the cardiac monitored which showed an underlying rhythm of: NSR    Medicines ordered:  I ordered medication including small aliquots of 50 mcg of fentanyl for shoulder pain, 1 unit PRBC for symptomatic anemia, bolus and infuse Protonix for upper GI bleed Reevaluation of the patient after these medicines showed that the patient stayed the same I have reviewed the patients home medicines and have made adjustments as needed   Critical Interventions:     Consults/Attending Physician   I requested consultation with nephrology,  and discussed lab and imaging findings as well as pertinent plan - they recommend: We will follow patient and dialyze  Dr. Myna Hidalgo   Discussed with Dr. Cyndia Skeeters of hospitalist service who will admit.  Discussed with Janett Billow of [facility] who will evaluate patient.  [facility] will follow patient during hospital stay.   Reevaluation:  After the interventions noted above I re-evaluated patient and found that they have :stayed the same   Social Determinants of Health:      Problem List / ED Course:     Dispostion:  After consideration of the diagnostic results and the patients response to treatment, I feel that the patent would benefit from admission.    Final Clinical Impression(s) / ED Diagnoses Final diagnoses:  Rectal bleeding  Symptomatic anemia    Rx / DC Orders ED Discharge Orders     None         Tedd Sias, [place] 10/05/21 1312    Godfrey Pick, MD 10/06/21 1709

## 2021-10-06 ENCOUNTER — Inpatient Hospital Stay (HOSPITAL_COMMUNITY): Payer: Medicare Other | Admitting: Anesthesiology

## 2021-10-06 ENCOUNTER — Encounter (HOSPITAL_COMMUNITY): Payer: Self-pay | Admitting: Student

## 2021-10-06 ENCOUNTER — Encounter (HOSPITAL_COMMUNITY)
Admission: EM | Disposition: A | Discharge status: Home Health | Payer: Self-pay | Source: Home / Self Care | Attending: Student

## 2021-10-06 DIAGNOSIS — D696 Thrombocytopenia, unspecified: Secondary | ICD-10-CM

## 2021-10-06 DIAGNOSIS — K31811 Angiodysplasia of stomach and duodenum with bleeding: Secondary | ICD-10-CM

## 2021-10-06 DIAGNOSIS — R9431 Abnormal electrocardiogram [ECG] [EKG]: Secondary | ICD-10-CM

## 2021-10-06 DIAGNOSIS — N186 End stage renal disease: Secondary | ICD-10-CM | POA: Diagnosis not present

## 2021-10-06 DIAGNOSIS — C220 Liver cell carcinoma: Secondary | ICD-10-CM | POA: Diagnosis not present

## 2021-10-06 DIAGNOSIS — K317 Polyp of stomach and duodenum: Secondary | ICD-10-CM | POA: Diagnosis not present

## 2021-10-06 DIAGNOSIS — I10 Essential (primary) hypertension: Secondary | ICD-10-CM

## 2021-10-06 DIAGNOSIS — K31819 Angiodysplasia of stomach and duodenum without bleeding: Secondary | ICD-10-CM | POA: Diagnosis not present

## 2021-10-06 DIAGNOSIS — K5521 Angiodysplasia of colon with hemorrhage: Secondary | ICD-10-CM

## 2021-10-06 DIAGNOSIS — D62 Acute posthemorrhagic anemia: Secondary | ICD-10-CM | POA: Diagnosis not present

## 2021-10-06 DIAGNOSIS — K766 Portal hypertension: Secondary | ICD-10-CM | POA: Diagnosis not present

## 2021-10-06 DIAGNOSIS — K3189 Other diseases of stomach and duodenum: Secondary | ICD-10-CM

## 2021-10-06 DIAGNOSIS — K922 Gastrointestinal hemorrhage, unspecified: Secondary | ICD-10-CM | POA: Diagnosis not present

## 2021-10-06 DIAGNOSIS — K635 Polyp of colon: Secondary | ICD-10-CM

## 2021-10-06 DIAGNOSIS — K921 Melena: Secondary | ICD-10-CM | POA: Diagnosis not present

## 2021-10-06 DIAGNOSIS — Z89512 Acquired absence of left leg below knee: Secondary | ICD-10-CM

## 2021-10-06 HISTORY — PX: ENTEROSCOPY: SHX5533

## 2021-10-06 HISTORY — PX: GIVENS CAPSULE STUDY: SHX5432

## 2021-10-06 HISTORY — PX: HOT HEMOSTASIS: SHX5433

## 2021-10-06 LAB — CBC
HCT: 22.5 % — ABNORMAL LOW (ref 39.0–52.0)
Hemoglobin: 7.5 g/dL — ABNORMAL LOW (ref 13.0–17.0)
MCH: 32.2 pg (ref 26.0–34.0)
MCHC: 33.3 g/dL (ref 30.0–36.0)
MCV: 96.6 fL (ref 80.0–100.0)
Platelets: 141 10*3/uL — ABNORMAL LOW (ref 150–400)
RBC: 2.33 MIL/uL — ABNORMAL LOW (ref 4.22–5.81)
RDW: 21.5 % — ABNORMAL HIGH (ref 11.5–15.5)
WBC: 5.2 10*3/uL (ref 4.0–10.5)
nRBC: 0 % (ref 0.0–0.2)

## 2021-10-06 LAB — COMPREHENSIVE METABOLIC PANEL
ALT: 15 U/L (ref 0–44)
AST: 48 U/L — ABNORMAL HIGH (ref 15–41)
Albumin: 2.3 g/dL — ABNORMAL LOW (ref 3.5–5.0)
Alkaline Phosphatase: 110 U/L (ref 38–126)
Anion gap: 19 — ABNORMAL HIGH (ref 5–15)
BUN: 52 mg/dL — ABNORMAL HIGH (ref 8–23)
CO2: 24 mmol/L (ref 22–32)
Calcium: 9 mg/dL (ref 8.9–10.3)
Chloride: 96 mmol/L — ABNORMAL LOW (ref 98–111)
Creatinine, Ser: 7 mg/dL — ABNORMAL HIGH (ref 0.61–1.24)
GFR, Estimated: 8 mL/min — ABNORMAL LOW (ref >60)
Glucose, Bld: 81 mg/dL (ref 70–99)
Potassium: 5.8 mmol/L — ABNORMAL HIGH (ref 3.5–5.1)
Sodium: 139 mmol/L (ref 135–145)
Total Bilirubin: 0.8 mg/dL (ref 0.3–1.2)
Total Protein: 7.1 g/dL (ref 6.5–8.1)

## 2021-10-06 LAB — POCT I-STAT, CHEM 8
BUN: 49 mg/dL — ABNORMAL HIGH (ref 8–23)
Calcium, Ion: 1.15 mmol/L (ref 1.15–1.40)
Chloride: 101 mmol/L (ref 98–111)
Creatinine, Ser: 7.5 mg/dL — ABNORMAL HIGH (ref 0.61–1.24)
Glucose, Bld: 82 mg/dL (ref 70–99)
HCT: 24 % — ABNORMAL LOW (ref 39.0–52.0)
Hemoglobin: 8.2 g/dL — ABNORMAL LOW (ref 13.0–17.0)
Potassium: 5 mmol/L (ref 3.5–5.1)
Sodium: 140 mmol/L (ref 135–145)
TCO2: 24 mmol/L (ref 22–32)

## 2021-10-06 LAB — HEMOGLOBIN AND HEMATOCRIT, BLOOD
HCT: 22.7 % — ABNORMAL LOW (ref 39.0–52.0)
Hemoglobin: 7.6 g/dL — ABNORMAL LOW (ref 13.0–17.0)

## 2021-10-06 LAB — HEPATITIS B SURFACE ANTIBODY,QUALITATIVE: Hep B S Ab: REACTIVE — AB

## 2021-10-06 LAB — LACTIC ACID, PLASMA
Lactic Acid, Venous: 1.3 mmol/L (ref 0.5–1.9)
Lactic Acid, Venous: 1.1 mmol/L (ref 0.5–1.9)

## 2021-10-06 LAB — HEPATITIS C ANTIBODY: HCV Ab: REACTIVE — AB

## 2021-10-06 LAB — GLUCOSE, CAPILLARY: Glucose-Capillary: 78 mg/dL (ref 70–99)

## 2021-10-06 LAB — HEPATITIS B CORE ANTIBODY, TOTAL: Hep B Core Total Ab: REACTIVE — AB

## 2021-10-06 LAB — HEPATITIS B SURFACE ANTIGEN: Hepatitis B Surface Ag: NONREACTIVE

## 2021-10-06 SURGERY — ENTEROSCOPY
Anesthesia: Monitor Anesthesia Care

## 2021-10-06 MED ORDER — LIDOCAINE 2% (20 MG/ML) 5 ML SYRINGE
INTRAMUSCULAR | Status: DC | PRN
  Administered 2021-10-06: 100 mg via INTRAVENOUS

## 2021-10-06 MED ORDER — PENTAFLUOROPROP-TETRAFLUOROETH EX AERO: 1.0000 "application " | INHALATION_SPRAY | CUTANEOUS | Status: DC | PRN

## 2021-10-06 MED ORDER — PROPOFOL 10 MG/ML IV BOLUS
INTRAVENOUS | Status: DC | PRN
  Administered 2021-10-06: 30 mg via INTRAVENOUS

## 2021-10-06 MED ORDER — SODIUM CHLORIDE 0.9 % IV SOLN: INTRAVENOUS | Status: DC

## 2021-10-06 MED ORDER — CHLORHEXIDINE GLUCONATE CLOTH 2 % EX PADS: 6.0000 | MEDICATED_PAD | Freq: Every day | CUTANEOUS | Status: DC

## 2021-10-06 MED ORDER — DEXTROSE 50 % IV SOLN
12.5000 g | Freq: Once | INTRAVENOUS | Status: AC
  Administered 2021-10-06: 12.5 g via INTRAVENOUS

## 2021-10-06 MED ORDER — HEPARIN SODIUM (PORCINE) 1000 UNIT/ML DIALYSIS: 1000.0000 [IU] | INTRAMUSCULAR | Status: DC | PRN

## 2021-10-06 MED ORDER — LIDOCAINE-PRILOCAINE 2.5-2.5 % EX CREA: 1.0000 "application " | TOPICAL_CREAM | CUTANEOUS | Status: DC | PRN

## 2021-10-06 MED ORDER — ALTEPLASE 2 MG IJ SOLR: 2.0000 mg | Freq: Once | INTRAMUSCULAR | Status: DC | PRN

## 2021-10-06 MED ORDER — LIDOCAINE HCL (PF) 1 % IJ SOLN: 5.0000 mL | INTRAMUSCULAR | Status: DC | PRN

## 2021-10-06 MED ORDER — PROPOFOL 500 MG/50ML IV EMUL
INTRAVENOUS | Status: DC | PRN
  Administered 2021-10-06: 125 ug/kg/min via INTRAVENOUS

## 2021-10-06 MED ORDER — HYDROXYZINE HCL 25 MG PO TABS
25.0000 mg | ORAL_TABLET | Freq: Three times a day (TID) | ORAL | Status: DC | PRN
Start: 2021-10-06 — End: 2021-10-10
  Administered 2021-10-06 – 2021-10-09 (×3): 25 mg via ORAL
  Filled 2021-10-06 (×3): qty 1

## 2021-10-06 MED ORDER — CAMPHOR-MENTHOL 0.5-0.5 % EX LOTN
TOPICAL_LOTION | CUTANEOUS | Status: DC | PRN
  Filled 2021-10-06: qty 222

## 2021-10-06 NOTE — Progress Notes (Signed)
PROGRESS NOTE  Daniel [name] ZOX:[id number] DOB: [birth year]   PCP: [name], NP  Patient is from: Home.  Lives alone.  Uses wheelchair at baseline.  DOA: 10/05/2021 LOS: 1  Chief complaints Chief Complaint  Patient presents with   Rectal Bleeding   Shoulder Pain     Brief Narrative / Interim history: 61 year old M with PMH of ESRD on HD MWF, IDA, recurrent GIB, duodenal ulcer, gastric ulcer, small bowel AVM, PAD, chronic pain syndrome, DM-2, orthostatic hypotension, EtOH use, tobacco use disorder, anxiety, left BKA, QTc and recent hospitalization from 6/3-6/4 for symptomatic anemia in the setting of recurrent GI bleed and multifocal pneumonia returning with ongoing melena, and admitted for acute on chronic blood loss anemia.  Hgb 7.3 (9.0 two days prior and 11.3 six days prior).  Seems he is on low-dose aspirin.  Also question about compliance with PPI.   Patient was transfused 1 unit.  GI consulted.  He underwent a small bowel endoscopy that showed normal esophagus, mild PHG, few friable gastric polyps at the pylorus (treated with APC), blood in duodenal bulb and first portion of duodenum, a single recently bleeding AVM in the duodenum (treated with APC), a single bleeding AVM in duodenum (treated with APC) and multiple duodenal polyps.  Capsule endoscopy underway to evaluate AVM burden.  GI recommends considering outpatient octreotide therapy.   Subjective: Seen and examined this afternoon after he returned from endoscopy.  No complaints other than itching.  Denies chest pain, dyspnea, dizziness, nausea, vomiting or abdominal pain.  Objective: Vitals:   10/06/21 0738 10/06/21 0856 10/06/21 0911 10/06/21 0938  BP: (!) 150/42 (!) 119/44  (!) 158/50  Pulse: 82 78  74  Resp: '16 14  16  '$ Temp: 97.9 F (36.6 C) (!) 97.4 F (36.3 C) (!) 97.4 F (36.3 C) (!) 97.4 F (36.3 C)  TempSrc: Temporal   Oral  SpO2: 96% 98%  98%  Weight: 108.8 kg     Height: '5\' 11"'$  (1.803 m)        Examination:  GENERAL: No apparent distress.  Nontoxic. HEENT: MMM.  Vision and hearing grossly intact.  NECK: Supple.  No apparent JVD.  RESP:  No IWOB.  Fair aeration bilaterally. CVS:  RRR. Heart sounds normal.  ABD/GI/GU: BS+. Abd soft, NTND.  MSK/EXT:  Left AKA stump with healing wound.  No signs of infection.  Trace RLE edema.  Fairly normal bilateral shoulder exam except for some pain with range of motion.  SKIN: Healing skin wounds about left AKA stump. NEURO: Awake, alert and oriented appropriately.  No apparent focal neuro deficit. PSYCH: Calm. Normal affect.   Procedures:  6/11-small bowel endoscopy as above.  Microbiology summarized: None  Assessment and plan: Principal Problem:   Acute on chronic blood loss anemia Active Problems:   Melena   ESRD (end stage renal disease) on dialysis (HCC)   Hepatocellular carcinoma (HCC)   Essential hypertension   AVM (arteriovenous malformation) of small bowel, acquired with hemorrhage   Prolonged QT interval   Upper GI bleed   Thrombocytopenia (HCC)   AVM (arteriovenous malformation) of duodenum, acquired   Tobacco use disorder   Chronic diastolic CHF (congestive heart failure) (HCC)   Recurrent gastrointestinal hemorrhage   Chronic pain disorder   Rotator cuff syndrome   Epistaxis   Hx of BKA, left (HCC)   Intestinal polyps   Portal hypertensive gastropathy (HCC)  Acute on chronic blood loss anemia in the setting of recurrent upper GI bleed Duodenal  AVM/PHG/gastritis-noted on his small bowel endoscopy again.  Endoscopy as above. Hemodynamically stable.  Patient is on low-dose aspirin for PAD.  Denies other NSAID use.  Not sure if he is compliant with his Protonix.  Supposed to take twice daily.  Transfused 1 unit on 6/10.  H&H seems to be stable.  Anemia panel with iron deficiency. Recent Labs    09/28/21 0729 09/28/21 2014 09/29/21 0454 10/03/21 0900 10/05/21 0729 10/05/21 1602 10/05/21 2043 10/06/21 0447  10/06/21 0755 10/06/21 0935  HGB 6.9* 10.1* 11.3* 9.0* 7.3* 7.7* 7.3* 7.5* 8.2* 7.6*  -Continue holding aspirin -We will give IV iron -Continue Protonix and octreotide.  -Check CBC in the morning unless obvious bleeding -Appreciate input by GI-capsule to evaluate AVM burden and consideration of outpatient octreotide treatment -Diet per GI.   Acute bilateral shoulder pain: Likely due to chronic rotator cuff tears and degenerative changes of acromioclavicular joint as noted on his MRIs on 09/17/2021.  Pain improved. -Pain control with Tylenol, Percocet and IV Dilaudid   ESRD on HD MWF-reports full session HD on Friday. -Notified nephrology -No need for urgent dialysis.   Chronic diastolic CHF: TTE in 0/2585 with LVEF of 55 to 60%, no RWMA and RVSP of 48.4.  Appears euvolemic on exam.  Making some urine as well.  Not on diuretics. -Fluid management with dialysis -Hold home metoprolol, Corlanor   History of hepatocellular carcinoma -Outpatient follow-up.   Left BKA-appears stable. -Continue Ace wrap   Tobacco use disorder: Smokes about half a pack a day.  Encouraged cessation. -Continue nicotine patch  Mild thrombocytopenia: Monitor.  Epistaxis: Prior to arrival.  Resolved.  Obesity:  Body mass index is 33.45 kg/m.  Could be underestimated by BKA       DVT prophylaxis:  SCDs Start: 10/05/21 1229  Code Status: Full code Family Communication: None at bedside. Level of care: Telemetry Medical Status is: Inpatient Remains inpatient appropriate because: Acute blood loss anemia   Final disposition: Likely home once medically cleared Consultants:  Gastroenterology Nephrology  Sch Meds:  Scheduled Meds:  [START ON 10/07/2021] Chlorhexidine Gluconate Cloth  6 each Topical Q0600   nicotine  14 mg Transdermal Daily   [START ON 10/08/2021] pantoprazole  40 mg Intravenous Q12H   Continuous Infusions:  octreotide (SANDOSTATIN) 500 mcg in sodium chloride 0.9 % 250 mL (2  mcg/mL) infusion 50 mcg/hr (10/06/21 1304)   pantoprazole 8 mg/hr (10/06/21 1123)   PRN Meds:.acetaminophen **OR** acetaminophen, alteplase, camphor-menthol, heparin, HYDROmorphone (DILAUDID) injection, hydrOXYzine, lidocaine (PF), lidocaine-prilocaine, oxyCODONE-acetaminophen, pentafluoroprop-tetrafluoroeth  Antimicrobials: Anti-infectives (From admission, onward)    None        I have personally reviewed the following labs and images: CBC: Recent Labs  Lab 10/03/21 0900 10/03/21 0900 10/05/21 0729 10/05/21 1602 10/05/21 2043 10/06/21 0447 10/06/21 0755 10/06/21 0935  WBC 5.3  --  4.8  --   --  5.2  --   --   NEUTROABS 2.9  --   --   --   --   --   --   --   HGB 9.0*   < > 7.3* 7.7* 7.3* 7.5* 8.2* 7.6*  HCT 26.7*  --  21.8* 23.0* 21.6* 22.5* 24.0* 22.7*  MCV 97.4  --  98.2  --   --  96.6  --   --   PLT 142*  --  146*  --   --  141*  --   --    < > = values in this interval not displayed.  BMP &GFR Recent Labs  Lab 10/03/21 0900 10/05/21 0729 10/06/21 0447 10/06/21 0755  NA 137 143 139 140  K 4.6 4.4 5.8* 5.0  CL 96* 96* 96* 101  CO2 '22 28 24  '$ --   GLUCOSE 97 96 81 82  BUN 46* 41* 52* 49*  CREATININE 8.09* 5.96* 7.00* 7.50*  CALCIUM 9.7 9.0 9.0  --    Estimated Creatinine Clearance: 13 mL/min (A) (by C-G formula based on SCr of 7.5 mg/dL (H)). Liver & Pancreas: Recent Labs  Lab 10/03/21 0900 10/05/21 0729 10/06/21 0447  AST 38 46* 48*  ALT '11 16 15  '$ ALKPHOS 144* 124 110  BILITOT 0.7 0.8 0.8  PROT 8.3* 7.5 7.1  ALBUMIN 3.2* 2.3* 2.3*   No results for input(s): "LIPASE", "AMYLASE" in the last 168 hours. No results for input(s): "AMMONIA" in the last 168 hours. Diabetic: No results for input(s): "HGBA1C" in the last 72 hours. Recent Labs  Lab 10/06/21 0859  GLUCAP 78   Cardiac Enzymes: No results for input(s): "CKTOTAL", "CKMB", "CKMBINDEX", "TROPONINI" in the last 168 hours. No results for input(s): "PROBNP" in the last 8760  hours. Coagulation Profile: Recent Labs  Lab 10/05/21 0729  INR 1.1   Thyroid Function Tests: No results for input(s): "TSH", "T4TOTAL", "FREET4", "T3FREE", "THYROIDAB" in the last 72 hours. Lipid Profile: No results for input(s): "CHOL", "HDL", "LDLCALC", "TRIG", "CHOLHDL", "LDLDIRECT" in the last 72 hours. Anemia Panel: Recent Labs    10/05/21 1601 10/05/21 1602  VITAMINB12  --  303  FOLATE 9.0  --   FERRITIN  --  153  TIBC  --  298  IRON  --  188*  RETICCTPCT 3.0  --    Urine analysis:    Component Value Date/Time   COLORURINE YELLOW 07/31/2019 2128   APPEARANCEUR CLEAR 07/31/2019 2128   LABSPEC 1.009 07/31/2019 2128   PHURINE 5.0 07/31/2019 2128   GLUCOSEU NEGATIVE 07/31/2019 2128   HGBUR SMALL (A) 07/31/2019 2128   BILIRUBINUR NEGATIVE 07/31/2019 2128   KETONESUR NEGATIVE 07/31/2019 2128   PROTEINUR 100 (A) 09/03/2021 1150   UROBILINOGEN 0.2 12/31/2009 0746   NITRITE NEGATIVE 07/31/2019 2128   LEUKOCYTESUR NEGATIVE 07/31/2019 2128   Sepsis Labs: Invalid input(s): "PROCALCITONIN", "LACTICIDVEN"  Microbiology: No results found for this or any previous visit (from the past 240 hour(s)).  Radiology Studies: No results found.    Terika Pillard T. Ackley  If 7PM-7AM, please contact night-coverage [email or web address] 10/06/2021, 4:33 PM

## 2021-10-06 NOTE — Interval H&P Note (Signed)
History and Physical Interval Note:  10/06/2021 7:24 AM  Daniel [name]  has presented today for surgery, with the diagnosis of Gastrointestinal bleeding.  The various methods of treatment have been discussed with the patient and family. After consideration of risks, benefits and other options for treatment, the patient has consented to  Procedure(s): ENTEROSCOPY (N/A) GIVENS CAPSULE STUDY (N/A) as a surgical intervention.  The patient's history has been reviewed, patient examined, no change in status, stable for surgery.  I have reviewed the patient's chart and labs.  Questions were answered to the patient's satisfaction.     [name]

## 2021-10-06 NOTE — Anesthesia Postprocedure Evaluation (Signed)
Anesthesia Post Note  Patient: Daniel [name]  Procedure(s) Performed: ENTEROSCOPY HOT HEMOSTASIS (ARGON PLASMA COAGULATION/BICAP) GIVENS CAPSULE STUDY GIVENS CAPSULE STUDY     Patient location during evaluation: PACU Anesthesia Type: MAC Level of consciousness: awake and alert, patient cooperative and oriented Pain management: pain level controlled Vital Signs Assessment: post-procedure vital signs reviewed and stable Respiratory status: spontaneous breathing, nonlabored ventilation and respiratory function stable Cardiovascular status: blood pressure returned to baseline and stable Postop Assessment: no apparent nausea or vomiting Anesthetic complications: no   No notable events documented.  Last Vitals:  Vitals:   10/06/21 0856 10/06/21 0911  BP: (!) 119/44   Pulse: 78   Resp: 14   Temp: (!) 36.3 C (!) 36.3 C  SpO2: 98%     Last Pain:  Vitals:   10/06/21 0911  TempSrc:   PainSc: 0-No pain                 Payten Hobin,E. Kaspar Albornoz

## 2021-10-06 NOTE — Consult Note (Addendum)
[facility] Renal Consultation Note    Indication for Consultation:  Management of ESRD/hemodialysis; anemia, hypertension/volume and secondary hyperparathyroidism  LSL:[id number], Milford Cage, NP  HPI: Daniel [name] is a 61 y.o. male with ESRD on HD MWF at [facility]. His past medical history is significant for IDA, recurrent GIB, duodenal ulcer, gastric ulcer, small bowel AVM, PAD, chronic pain syndrome, DM-2, orthostatic hypotension, EtOH use, tobacco use disorder, anxiety, left BKA, QTc and recent hospitalization from 6/3-6/4 for symptomatic anemia in the setting of recurrent GI bleed and multifocal pneumonia. Patient returns to the ED c/o ongoing black tarry stools. Reviewed previous medical records. Noted episode of epistaxis yesterday but none currently. Noted 1 unit PRBCs given for Hgb in 7s (drop from 9 on 6/8). Patient not on AC/ASA and Octreotide/Protonix ordered. Seen and examined patient at bedside. On RA and Bps stable. GI on board-S/p small bowel enteroscopy by Dr. Candis Schatz today. Plan for HD tomorrow per his routine schedule.  Past Medical History:  Diagnosis Date   Anemia    Diabetes mellitus without complication [facility])    patient denies   Dialysis patient [facility])    End stage chronic kidney disease ([place])    Hypertension    ICH (intracerebral hemorrhage) ([place]) 05/20/2017   PAD (peripheral artery disease) ([facility])    Shoulder pain, left 06/28/2013   Past Surgical History:  Procedure Laterality Date   A/V FISTULAGRAM N/A 08/15/2020   Procedure: A/V FISTULAGRAM - Left Upper;  Surgeon: Cherre Robins, MD;  Location: [place] CV LAB;  Service: Cardiovascular;  Laterality: N/A;   ABDOMINAL AORTOGRAM W/LOWER EXTREMITY N/A 04/08/2021   Procedure: ABDOMINAL AORTOGRAM W/LOWER EXTREMITY;  Surgeon: Waynetta Sandy, MD;  Location: [place] CV LAB;  Service: Cardiovascular;  Laterality: N/A;   AMPUTATION Left 04/16/2021   Procedure: LEFT BELOW  KNEE AMPUTATION;  Surgeon: Angelia Mould, MD;  Location: [place];  Service: Vascular;  Laterality: Left;   AMPUTATION Left 06/17/2021   Procedure: REVISION AMPUTATION BELOW KNEE;  Surgeon: Broadus John, MD;  Location: [place];  Service: Vascular;  Laterality: Left;   APPENDECTOMY     APPLICATION OF WOUND VAC Left 06/17/2021   Procedure: APPLICATION OF WOUND VAC;  Surgeon: Broadus John, MD;  Location: [place];  Service: Vascular;  Laterality: Left;   AV FISTULA PLACEMENT Left 08/03/2019   Procedure: LEFT ARM ARTERIOVENOUS (AV) CEPHALIC  FISTULA CREATION;  Surgeon: Waynetta Sandy, MD;  Location: [place];  Service: Vascular;  Laterality: Left;   BIOPSY  06/30/2019   Procedure: BIOPSY;  Surgeon: [place] Lobo, MD;  Location: [facility] ENDOSCOPY;  Service: Endoscopy;;   BIOPSY  08/02/2019   Procedure: BIOPSY;  Surgeon: Yetta Flock, MD;  Location: [facility] ENDOSCOPY;  Service: Gastroenterology;;   BIOPSY  02/08/2021   Procedure: BIOPSY;  Surgeon: Sharyn Creamer, MD;  Location: [facility] ENDOSCOPY;  Service: Gastroenterology;;   BIOPSY  02/24/2021   Procedure: BIOPSY;  Surgeon: [name], MD;  Location: [facility] ENDOSCOPY;  Service: Gastroenterology;;   BIOPSY  03/26/2021   Procedure: BIOPSY;  Surgeon: Lavena Bullion, DO;  Location: [place];  Service: Gastroenterology;;   BIOPSY  08/06/2021   Procedure: BIOPSY;  Surgeon: [name], MD;  Location: [place];  Service: Gastroenterology;;   BIOPSY  09/15/2021   Procedure: BIOPSY;  Surgeon: Sharyn Creamer, MD;  Location: [place];  Service: Gastroenterology;;   COLONOSCOPY  01/23/2012   Procedure: COLONOSCOPY;  Surgeon: Danie Binder, MD;  [facility];  Service: Endoscopy;  Laterality: N/A;  11:10 AM   COLONOSCOPY WITH PROPOFOL N/A 06/30/2019   Procedure: COLONOSCOPY WITH PROPOFOL;  Surgeon: [place] Lobo, MD;  Location: [place];  Service: Endoscopy;  Laterality: N/A;   ENTEROSCOPY N/A 08/02/2019    Procedure: ENTEROSCOPY;  Surgeon: [place]ta Flock, MD;  Location: [facility] ENDOSCOPY;  Service: Gastroenterology;  Laterality: N/A;   ENTEROSCOPY N/A 02/24/2021   Procedure: ENTEROSCOPY;  Surgeon: [name], MD;  Location: [facility] ENDOSCOPY;  Service: Gastroenterology;  Laterality: N/A;   ENTEROSCOPY N/A 03/26/2021   Procedure: ENTEROSCOPY;  Surgeon: Lavena Bullion, DO;  Location: [place];  Service: Gastroenterology;  Laterality: N/A;   ENTEROSCOPY N/A 07/05/2021   Procedure: ENTEROSCOPY;  Surgeon: Carol Ada, MD;  Location: [place];  Service: Gastroenterology;  Laterality: N/A;   ENTEROSCOPY N/A 08/06/2021   Procedure: ENTEROSCOPY;  Surgeon: [name], MD;  Location: [facility] ENDOSCOPY;  Service: Gastroenterology;  Laterality: N/A;   ENTEROSCOPY N/A 08/24/2021   Procedure: ENTEROSCOPY;  Surgeon: Ladene Artist, MD;  Location: [facility] ENDOSCOPY;  Service: Gastroenterology;  Laterality: N/A;   ENTEROSCOPY N/A 09/15/2021   Procedure: ENTEROSCOPY;  Surgeon: Sharyn Creamer, MD;  Location: [facility] ENDOSCOPY;  Service: Gastroenterology;  Laterality: N/A;   ESOPHAGOGASTRODUODENOSCOPY N/A 08/10/2020   Procedure: ESOPHAGOGASTRODUODENOSCOPY (EGD);  Surgeon: Jerene Bears, MD;  Location: [facility] ENDOSCOPY;  Service: Gastroenterology;  Laterality: N/A;   ESOPHAGOGASTRODUODENOSCOPY (EGD) WITH PROPOFOL N/A 06/30/2019   Procedure: ESOPHAGOGASTRODUODENOSCOPY (EGD) WITH PROPOFOL;  Surgeon: [place] Lobo, MD;  Location: [place];  Service: Endoscopy;  Laterality: N/A;   ESOPHAGOGASTRODUODENOSCOPY (EGD) WITH PROPOFOL N/A 01/12/2021   Procedure: ESOPHAGOGASTRODUODENOSCOPY (EGD) WITH PROPOFOL;  Surgeon: Sharyn Creamer, MD;  Location: [place];  Service: Gastroenterology;  Laterality: N/A;   ESOPHAGOGASTRODUODENOSCOPY (EGD) WITH PROPOFOL N/A 02/08/2021   Procedure: ESOPHAGOGASTRODUODENOSCOPY (EGD) WITH PROPOFOL;  Surgeon: Sharyn Creamer, MD;  Location: [place];  Service: Gastroenterology;   Laterality: N/A;   FISTULA SUPERFICIALIZATION Left 10/17/2019   Procedure: LEFT UPPER EXTREMITY FISTULA REVISION, SIDE BRANCH LIGATION,  AND SUPERFICIALIZATION;  Surgeon: Marty Heck, MD;  Location: [place];  Service: Vascular;  Laterality: Left;   FISTULA SUPERFICIALIZATION Left 78/24/2353   Procedure: PLICATION OF ANEURYSM LEFT ARTERIOVENOUS FISTULA;  Surgeon: Angelia Mould, MD;  Location: [place];  Service: Vascular;  Laterality: Left;   FLEXIBLE SIGMOIDOSCOPY N/A 07/05/2021   Procedure: FLEXIBLE SIGMOIDOSCOPY;  Surgeon: Carol Ada, MD;  Location: [place];  Service: Gastroenterology;  Laterality: N/A;   GIVENS CAPSULE STUDY N/A 06/30/2019   Procedure: GIVENS CAPSULE STUDY;  Surgeon: [place] Lobo, MD;  Location: [place];  Service: Endoscopy;  Laterality: N/A;   GIVENS CAPSULE STUDY N/A 08/25/2021   Procedure: GIVENS CAPSULE STUDY;  Surgeon: Ladene Artist, MD;  Location: [place];  Service: Gastroenterology;  Laterality: N/A;   HEMOSTASIS CLIP PLACEMENT  08/10/2020   Procedure: HEMOSTASIS CLIP PLACEMENT;  Surgeon: Jerene Bears, MD;  Location: [place];  Service: Gastroenterology;;   HEMOSTASIS CLIP PLACEMENT  01/12/2021   Procedure: HEMOSTASIS CLIP PLACEMENT;  Surgeon: Sharyn Creamer, MD;  Location: [place];  Service: Gastroenterology;;   HEMOSTASIS CONTROL  08/02/2019   Procedure: HEMOSTASIS CONTROL;  Surgeon: Yetta Flock, MD;  Location: [place];  Service: Gastroenterology;;   HOT HEMOSTASIS  02/24/2021   Procedure: HOT HEMOSTASIS (ARGON PLASMA COAGULATION/BICAP);  Surgeon: [name], MD;  Location: [facility] ENDOSCOPY;  Service: Gastroenterology;;   HOT HEMOSTASIS N/A 03/26/2021   Procedure: HOT HEMOSTASIS (ARGON  PLASMA COAGULATION/BICAP);  Surgeon: Lavena Bullion, DO;  Location: [facility] ENDOSCOPY;  Service: Gastroenterology;  Laterality: N/A;   HOT HEMOSTASIS N/A 07/05/2021   Procedure: HOT HEMOSTASIS (ARGON PLASMA COAGULATION/BICAP);   Surgeon: Carol Ada, MD;  Location: [place];  Service: Gastroenterology;  Laterality: N/A;   HOT HEMOSTASIS N/A 09/15/2021   Procedure: HOT HEMOSTASIS (ARGON PLASMA COAGULATION/BICAP);  Surgeon: Sharyn Creamer, MD;  Location: [place];  Service: Gastroenterology;  Laterality: N/A;   INCISION AND DRAINAGE ABSCESS N/A 06/29/2016   Procedure: INCISION AND DRAINAGE ABDOMINAL WALL ABSCESS;  Surgeon: Alphonsa Overall, MD;  Location: [facility];  Service: General;  Laterality: N/A;   INSERTION OF DIALYSIS CATHETER Right 08/03/2019   Procedure: INSERTION OF DIALYSIS CATHETER;  Surgeon: Waynetta Sandy, MD;  Location: [place];  Service: Vascular;  Laterality: Right;   INSERTION OF DIALYSIS CATHETER Right 10/22/2019   Procedure: INSERTION OF 23CM TUNNELED DIALYSIS CATHETER RIGHT INTERNAL JUGULAR;  Surgeon: Angelia Mould, MD;  Location: [place];  Service: Vascular;  Laterality: Right;   INSERTION OF DIALYSIS CATHETER Right 08/12/2020   Procedure: INSERTION OF Right internal Jugular TUNNELED  DIALYSIS CATHETER.;  Surgeon: Waynetta Sandy, MD;  Location: [place];  Service: Vascular;  Laterality: Right;   INSERTION OF DIALYSIS CATHETER N/A 04/19/2021   Procedure: INSERTION OF TUNNELED DIALYSIS CATHETER;  Surgeon: Angelia Mould, MD;  Location: [facility] OR;  Service: Vascular;  Laterality: N/A;   IR RADIOLOGIST EVAL & MGMT  09/12/2021   Left heel surgery     LOWER EXTREMITY ANGIOGRAPHY N/A 07/08/2021   Procedure: Lower Extremity Angiography;  Surgeon: Cherre Robins, MD;  Location: [place] [facility];  Service: Cardiovascular;  Laterality: N/A;   PENILE BIOPSY N/A 03/26/2020   Procedure: PENILE ULCER DEBRIDEMENT;  Surgeon: Remi Haggard, MD;  Location: [facility];  Service: Urology;  Laterality: N/A;  30 MINS   PERIPHERAL VASCULAR INTERVENTION Right 07/08/2021   Procedure: PERIPHERAL VASCULAR INTERVENTION;  Surgeon: Cherre Robins, MD;  Location: [place] CV LAB;  Service:  Cardiovascular;  Laterality: Right;   SCLEROTHERAPY  01/12/2021   Procedure: SCLEROTHERAPY;  Surgeon: Sharyn Creamer, MD;  Location: [facility] ENDOSCOPY;  Service: Gastroenterology;;   Clide Deutscher  02/24/2021   Procedure: Clide Deutscher;  Surgeon: [name], MD;  Location: [place];  Service: Gastroenterology;;   SUBMUCOSAL TATTOO INJECTION  08/24/2021   Procedure: SUBMUCOSAL TATTOO INJECTION;  Surgeon: Ladene Artist, MD;  Location: [place];  Service: Gastroenterology;;   SUBMUCOSAL TATTOO INJECTION  09/15/2021   Procedure: SUBMUCOSAL TATTOO INJECTION;  Surgeon: Sharyn Creamer, MD;  Location: [facility] ENDOSCOPY;  Service: Gastroenterology;;   Family History  Problem Relation Age of [place]   Colon cancer Neg Hx    Social History:  reports that he has been smoking cigarettes. He has a 22.50 pack-year smoking history. He has never used smokeless tobacco. He reports that he does not currently use alcohol. He reports that he does not currently use drugs after having used the following drugs: "Crack" cocaine. Allergies  Allergen Reactions   Seroquel [Quetiapine] Other (See Comments)    Tardive kinesia/dystonia   Dilaudid [Hydromorphone Hcl] Itching and Other (See Comments)    Pt reports itchiness after IM injection    Prior to Admission medications   Medication Sig Start Date End Date Taking? Authorizing Provider  amLODipine (NORVASC) 5 MG tablet Take 1 tablet (5 mg total) by mouth daily. 09/18/21 12/17/21 Yes [place] Donnamarie Poag, DO  aspirin EC 81 MG tablet  Take 1 tablet (81 mg total) by mouth daily. 09/18/21 12/17/21 Yes [place] Eric J, DO  atorvastatin (LIPITOR) 40 MG tablet Take 1 tablet (40 mg total) by mouth daily. 09/18/21 12/17/21 Yes [place] Eric J, DO  gabapentin (NEURONTIN) 100 MG capsule Take 1 capsule (100 mg total) by mouth 3 (three) times daily. 09/18/21 12/17/21 Yes [place] Donnamarie Poag, DO  oxyCODONE-acetaminophen (PERCOCET) 5-325 MG tablet Take 1 tablet by mouth every 6 (six) hours as  needed. Patient taking differently: Take 1 tablet by mouth every 6 (six) hours as needed for moderate pain. 09/25/21  Yes Milton Ferguson, MD  pantoprazole (PROTONIX) 40 MG tablet Take 1 tablet (40 mg total) by mouth 2 (two) times daily before a meal. 09/18/21 12/17/21 Yes [place] Donnamarie Poag, DO  traZODone (DESYREL) 50 MG tablet Take 1 tablet (50 mg total) by mouth at bedtime as needed for sleep. 09/18/21 10/18/21 Yes [place] Eric J, DO  calcium acetate, Phos Binder, (PHOSLYRA) 667 MG/5ML SOLN Take 5 mLs (667 mg total) by mouth 3 (three) times daily with meals. Patient not taking: Reported on 10/05/2021 09/18/21 12/17/21  [place] Donnamarie Poag, DO  Darbepoetin Alfa (ARANESP) 100 MCG/0.5ML SOSY injection Inject 0.5 mLs (100 mcg total) into the vein every 7 (seven) days for 8 doses. Patient not taking: Reported on 09/15/2021 08/19/21 10/08/21  Dana Allan I, MD  ivabradine (CORLANOR) 5 MG TABS tablet Take 1 tablet (5 mg total) by mouth 2 (two) times daily with a meal. Take 2 Tablets 90-120 minutes prior to Scan. Patient not taking: Reported on 10/05/2021 09/26/21   Warren Lacy, PA-C  metoprolol tartrate (LOPRESSOR) 100 MG tablet Take 1 tablet (100 mg total) by mouth 2 (two) times daily. Take 1 Tablet 90-120 minutes prior to scan. Patient not taking: Reported on 10/05/2021 09/26/21 12/25/21  Warren Lacy, PA-C  colchicine 0.6 MG tablet Take 0.5 tablets (0.3 mg total) by mouth 2 (two) times daily. 07/24/20 07/29/20  Noemi Chapel, MD  furosemide (LASIX) 40 MG tablet Take 1 tablet (40 mg total) by mouth daily. 07/25/19 02/09/20  Ladona Horns, MD   Current Facility-Administered Medications  Medication Dose Route Frequency Provider Last Rate Last Admin   acetaminophen (TYLENOL) tablet 650 mg  650 mg Oral Q6H PRN Mercy Riding, MD       Or   acetaminophen (TYLENOL) suppository 650 mg  650 mg Rectal Q6H PRN Mercy Riding, MD       camphor-menthol (SARNA) lotion   Topical PRN Etta Quill, DO   Given at 10/06/21  1517   HYDROmorphone (DILAUDID) injection 0.5 mg  0.5 mg Intravenous Q4H PRN Wendee Beavers T, MD   0.5 mg at 10/06/21 1119   nicotine (NICODERM CQ - dosed in mg/24 hours) patch 14 mg  14 mg Transdermal Daily Gonfa, Taye T, MD   14 mg at 10/06/21 1100   octreotide (SANDOSTATIN) 500 mcg in sodium chloride 0.9 % 250 mL (2 mcg/mL) infusion  50 mcg/hr Intravenous Continuous Wendee Beavers T, MD 25 mL/hr at 10/06/21 0803 50 mcg/hr at 10/06/21 0803   oxyCODONE-acetaminophen (PERCOCET/ROXICET) 5-325 MG per tablet 1 tablet  1 tablet Oral Q6H PRN Wendee Beavers T, MD   1 tablet at 10/05/21 2048   [START ON 10/08/2021] pantoprazole (PROTONIX) injection 40 mg  40 mg Intravenous Q12H Gonfa, Taye T, MD       pantoprozole (PROTONIX) 80 mg /NS 100 mL infusion  8 mg/hr Intravenous Continuous Mercy Riding, MD 10 mL/hr at 10/06/21  1123 8 mg/hr at 10/06/21 1123   Labs: Basic Metabolic Panel: Recent Labs  Lab 10/03/21 0900 10/05/21 0729 10/06/21 0447 10/06/21 0755  NA 137 143 139 140  K 4.6 4.4 5.8* 5.0  CL 96* 96* 96* 101  CO2 '22 28 24  '$ --   GLUCOSE 97 96 81 82  BUN 46* 41* 52* 49*  CREATININE 8.09* 5.96* 7.00* 7.50*  CALCIUM 9.7 9.0 9.0  --    Liver Function Tests: Recent Labs  Lab 10/03/21 0900 10/05/21 0729 10/06/21 0447  AST 38 46* 48*  ALT '11 16 15  '$ ALKPHOS 144* 124 110  BILITOT 0.7 0.8 0.8  PROT 8.3* 7.5 7.1  ALBUMIN 3.2* 2.3* 2.3*   No results for input(s): "LIPASE", "AMYLASE" in the last 168 hours. No results for input(s): "AMMONIA" in the last 168 hours. CBC: Recent Labs  Lab 10/03/21 0900 10/05/21 0729 10/05/21 1602 10/06/21 0447 10/06/21 0755 10/06/21 0935  WBC 5.3 4.8  --  5.2  --   --   NEUTROABS 2.9  --   --   --   --   --   HGB 9.0* 7.3*   < > 7.5* 8.2* 7.6*  HCT 26.7* 21.8*   < > 22.5* 24.0* 22.7*  MCV 97.4 98.2  --  96.6  --   --   PLT 142* 146*  --  141*  --   --    < > = values in this interval not displayed.   Cardiac Enzymes: No results for input(s): "CKTOTAL",  "CKMB", "CKMBINDEX", "TROPONINI" in the last 168 hours. CBG: Recent Labs  Lab 10/06/21 0859  GLUCAP 78   Iron Studies:  Recent Labs    10/05/21 1602  IRON 188*  TIBC 298  FERRITIN 153   Studies/Results: DG Abd Portable 1V  Result Date: 10/05/2021 CLINICAL DATA:  Bilateral shoulder pain and dark colored stools. EXAM: PORTABLE ABDOMEN - 1 VIEW COMPARISON:  October 22, 2019 FINDINGS: Multiple overlying radiopaque cardiac lead wires are seen. The bowel gas pattern is normal. No radio-opaque calculi or other significant radiographic abnormality are seen. IMPRESSION: Negative. Electronically Signed   By: Virgina Norfolk M.D.   On: 10/05/2021 15:36     Physical Exam: Vitals:   10/06/21 0738 10/06/21 0856 10/06/21 0911 10/06/21 0938  BP: (!) 150/42 (!) 119/44  (!) 158/50  Pulse: 82 78  74  Resp: '16 14  16  '$ Temp: 97.9 F (36.6 C) (!) 97.4 F (36.3 C) (!) 97.4 F (36.3 C) (!) 97.4 F (36.3 C)  TempSrc: Temporal   Oral  SpO2: 96% 98%  98%  Weight: 108.8 kg     Height: '5\' 11"'$  (1.803 m)        General: WDWN NAD Head: Sclera anicteric Lungs: Clear throughout; No wheeze, rales or rhonchi. Breathing is unlabored. Heart: RRR. No murmur, rubs or gallops.  Abdomen: soft and non-tender Lower extremities: no edema BLLE, s/p left BKA Neuro: AAOx3. Moves all extremities spontaneously. Dialysis Access: L AVF (+) B/T  Dialysis Orders:  MWF- [place] 4hrs, BFR 450, DFR 500,  EDW 109kg, 2K/ 2Ca No Heparin bolus No Micera recently ordered  Assessment/Plan: Acute on chronic blood loss anemia-GI following; s/p small bowel enteroscopy today. Continue to hold ASA/AC; On Octreotide and protonix. Monitor Hgb trends and transfuse for Hgb less than 7. ESRD - on HD MWF. Plan for HD tomorrow per his usual schedule. Hypertension/volume- Euvolemic on exam and Bps stable. Appears patient has been reaching  his EDW in outpatient. UF as tolerated. Anemia of CKD -Hgb now 7.6.  See above. ESA was not recently ordered in outpatient. Will resume here if indicated. Checking labs in AM. Transfuse PRN for Hgb less than 7. Secondary Hyperparathyroidism - Will check PO4 in AM. Not on VDRA in outpatient. Monitor trends here. Nutrition - Advance to renal diet when clinically stable  Tobie Poet, NP [facility] 10/06/2021, 12:29 PM    I have seen and examined this patient and agree with plan and assessment in the above note with renal recommendations/intervention highlighted. 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  S/p APC to sites.  Pt is now hungry and allowed clear liquids.  Plan for HD tomorrow without heparin and follow H/H. Broadus John A Esiah Bazinet,MD 10/06/2021 1:42 PM

## 2021-10-06 NOTE — Op Note (Signed)
[facility] Patient Name: Daniel [name] Procedure Date : 10/06/2021 MRN: [id number] Attending MD: Gladstone Pih. Candis Schatz , MD Date of Birth: 11/22/1960 CSN: [id number] Age: 61 Admit Type: Outpatient Procedure:                Small bowel enteroscopy Indications:              Arteriovenous malformation in the small intestine Providers:                Nicki Reaper E. Candis Schatz, MD, Jeanella Cara, RN,                            Despina Pole, Technician, Lance Coon, CRNA Referring MD:              Medicines:                Monitored Anesthesia Care Complications:            No immediate complications. Estimated Blood Loss:     Estimated blood loss was minimal. Procedure:                Pre-Anesthesia Assessment:                           - Prior to the procedure, a History and Physical                            was performed, and patient medications and                            allergies were reviewed. The patient's tolerance of                            previous anesthesia was also reviewed. The risks                            and benefits of the procedure and the sedation                            options and risks were discussed with the patient.                            All questions were answered, and informed consent                            was obtained. Prior Anticoagulants: The patient has                            taken no previous anticoagulant or antiplatelet                            agents except for aspirin. ASA Grade Assessment:                            III - A patient with severe systemic disease. After  reviewing the risks and benefits, the patient was                            deemed in satisfactory condition to undergo the                            procedure.                           After obtaining informed consent, the endoscope was                            passed under direct vision. Throughout the                             procedure, the patient's blood pressure, pulse, and                            oxygen saturations were monitored continuously. The                            [id number]-[id number] ([id number]) Olympus peds colonoscope was                            introduced through the mouth and advanced to the                            mid-jejunum. The small bowel enteroscopy was                            accomplished without difficulty. The patient                            tolerated the procedure well. Following compl The                            [id number] ([id number]) Olympus endoscope was introduced                            through the mouth and advanced to the duodenal bulb. Scope In: Scope Out: Findings:      The examined esophagus was normal.      Mild portal hypertensive gastropathy was found in the gastric body.      Two sessile polyps with friability and pinpoint hemorrhage were found at       the pylorus. Coagulation for tissue destruction using argon plasma was       successful. Estimated blood loss was minimal.      Red blood was found in the duodenal bulb and in the fourth portion of       the duodenum.      A single angiodysplastic lesion with stigmata of recent bleeding       (adherent clot) was found in the duodenal bulb. Coagulation for       hemostasis using argon plasma was successful. Estimated blood loss was       minimal.      Multiple  small sessile inflammatory polyps with no bleeding were found       in the duodenal bulb.      The exam of the duodenum was otherwise normal.      A single angiodysplastic lesion with active oozing was found in the       proximal jejunum. Coagulation for hemostasis using argon plasma was       successful. Estimated blood loss was minimal.      A tattoo was seen in the mid-jejunum. The tattoo site appeared normal.      Exam of the jejunum was otherwise normal.      Following the enteroscopy, the enteroscope was withdrawn and a        gastroscope was then reinserted with the capsule deployment device.       Using the endoscope, the video capsule was advanced into the duodenal       bulb and then successfully deployed. Impression:               - Normal esophagus.                           - Mild portal hypertensive gastropathy.                           - A few friable gastric polyps at the pylorus.                            Treated with argon plasma coagulation (APC).                           - Blood in the duodenal bulb and in the fourth                            portion of the duodenum.                           - A single recently bleeding angiodysplastic lesion                            in the duodenum. Treated with argon plasma                            coagulation (APC).                           - Multiple duodenal polyps.                           - A single bleeding angiodysplastic lesion in the                            duodenum. Treated with argon plasma coagulation                            (APC).                           - A tattoo was seen in the  jejunum. The tattoo site                            appeared normal.                           - Successful completion of the Video Capsule                            Enteroscope placement.                           - No specimens collected. Moderate Sedation:      none/MAC Recommendation:           - Return patient to hospital ward for ongoing care.                           - Resume previous diet.                           - Continue to monitor CBC daily                           - Continue present medications                           - Patient should be considered for outpatient                            octreotide therapy to reduce bleeding from                            intestinal AVMs                           - Await capsule results to determine AVM burden                            deeper in the small intestine. Procedure Code(s):         --- Professional ---                           [phone], Small intestinal endoscopy, enteroscopy                            beyond second portion of duodenum, not including                            ileum; with ablation of tumor(s), polyp(s), or                            other lesion(s) not amenable to removal by hot                            biopsy forceps, bipolar cautery or snare technique Diagnosis Code(s):        --- Professional ---  K76.6, Portal hypertension                           K31.89, Other diseases of stomach and duodenum                           K31.7, Polyp of stomach and duodenum                           K92.2, Gastrointestinal hemorrhage, unspecified                           K31.811, Angiodysplasia of stomach and duodenum                            with bleeding CPT copyright 2019 [employer]. All rights reserved. The codes documented in this report are preliminary and upon coder review may  be revised to meet current compliance requirements. Merrianne Mccumbers E. Candis Schatz, MD 10/06/2021 9:19:05 AM This report has been signed electronically. Number of Addenda: 0

## 2021-10-06 NOTE — Transfer of Care (Signed)
Immediate Anesthesia Transfer of Care Note  Patient: Daniel [name]  Procedure(s) Performed: ENTEROSCOPY HOT HEMOSTASIS (ARGON PLASMA COAGULATION/BICAP) GIVENS CAPSULE STUDY GIVENS CAPSULE STUDY  Patient Location: PACU  Anesthesia Type:MAC  Level of Consciousness: drowsy and patient cooperative  Airway & Oxygen Therapy: Patient Spontanous Breathing  Post-op Assessment: Report given to RN and Post -op Vital signs reviewed and stable  Post vital signs: Reviewed and stable  Last Vitals:  Vitals Value Taken Time  BP    Temp    Pulse    Resp    SpO2      Last Pain:  Vitals:   10/06/21 0738  TempSrc: Temporal  PainSc:          Complications: No notable events documented.

## 2021-10-06 NOTE — Interval H&P Note (Signed)
History and Physical Interval Note:  10/06/2021 7:27 AM  Daniel [name]  has presented today for surgery, with the diagnosis of Gastrointestinal bleeding.  The various methods of treatment have been discussed with the patient and family. After consideration of risks, benefits and other options for treatment, the patient has consented to  Procedure(s): ENTEROSCOPY (N/A) GIVENS CAPSULE STUDY (N/A) as a surgical intervention.  The patient's history has been reviewed, patient examined, no change in status, stable for surgery.  I have reviewed the patient's chart and labs.  Questions were answered to the patient's satisfaction.     [name]

## 2021-10-06 NOTE — Progress Notes (Signed)
Pill camera deployed at 0845 via endo. Instructions reviewed with recovery RN and patient.

## 2021-10-07 ENCOUNTER — Inpatient Hospital Stay (HOSPITAL_COMMUNITY): Payer: Medicare Other

## 2021-10-07 DIAGNOSIS — K921 Melena: Secondary | ICD-10-CM | POA: Diagnosis not present

## 2021-10-07 DIAGNOSIS — C220 Liver cell carcinoma: Secondary | ICD-10-CM | POA: Diagnosis not present

## 2021-10-07 DIAGNOSIS — K31819 Angiodysplasia of stomach and duodenum without bleeding: Secondary | ICD-10-CM | POA: Diagnosis not present

## 2021-10-07 DIAGNOSIS — N186 End stage renal disease: Secondary | ICD-10-CM | POA: Diagnosis not present

## 2021-10-07 DIAGNOSIS — D62 Acute posthemorrhagic anemia: Secondary | ICD-10-CM | POA: Diagnosis not present

## 2021-10-07 DIAGNOSIS — R109 Unspecified abdominal pain: Secondary | ICD-10-CM

## 2021-10-07 DIAGNOSIS — E875 Hyperkalemia: Secondary | ICD-10-CM

## 2021-10-07 DIAGNOSIS — R1032 Left lower quadrant pain: Secondary | ICD-10-CM

## 2021-10-07 LAB — CBC WITH DIFFERENTIAL/PLATELET
Abs Immature Granulocytes: 0.03 10*3/uL (ref 0.00–0.07)
Basophils Absolute: 0 10*3/uL (ref 0.0–0.1)
Basophils Relative: 0 %
Eosinophils Absolute: 0.2 10*3/uL (ref 0.0–0.5)
Eosinophils Relative: 4 %
HCT: 21.7 % — ABNORMAL LOW (ref 39.0–52.0)
Hemoglobin: 7.2 g/dL — ABNORMAL LOW (ref 13.0–17.0)
Immature Granulocytes: 1 %
Lymphocytes Relative: 29 %
Lymphs Abs: 1.6 10*3/uL (ref 0.7–4.0)
MCH: 33 pg (ref 26.0–34.0)
MCHC: 33.2 g/dL (ref 30.0–36.0)
MCV: 99.5 fL (ref 80.0–100.0)
Monocytes Absolute: 0.5 10*3/uL (ref 0.1–1.0)
Monocytes Relative: 10 %
Neutro Abs: 3.1 10*3/uL (ref 1.7–7.7)
Neutrophils Relative %: 56 %
Platelets: 155 10*3/uL (ref 150–400)
RBC: 2.18 MIL/uL — ABNORMAL LOW (ref 4.22–5.81)
RDW: 21.2 % — ABNORMAL HIGH (ref 11.5–15.5)
WBC: 5.5 10*3/uL (ref 4.0–10.5)
nRBC: 0 % (ref 0.0–0.2)

## 2021-10-07 LAB — GLUCOSE, CAPILLARY: Glucose-Capillary: 103 mg/dL — ABNORMAL HIGH (ref 70–99)

## 2021-10-07 LAB — RENAL FUNCTION PANEL
Albumin: 2.4 g/dL — ABNORMAL LOW (ref 3.5–5.0)
Anion gap: 19 — ABNORMAL HIGH (ref 5–15)
BUN: 57 mg/dL — ABNORMAL HIGH (ref 8–23)
CO2: 22 mmol/L (ref 22–32)
Calcium: 8.8 mg/dL — ABNORMAL LOW (ref 8.9–10.3)
Chloride: 99 mmol/L (ref 98–111)
Creatinine, Ser: 7.86 mg/dL — ABNORMAL HIGH (ref 0.61–1.24)
GFR, Estimated: 7 mL/min — ABNORMAL LOW (ref >60)
Glucose, Bld: 95 mg/dL (ref 70–99)
Phosphorus: 5.2 mg/dL — ABNORMAL HIGH (ref 2.5–4.6)
Potassium: 5.3 mmol/L — ABNORMAL HIGH (ref 3.5–5.1)
Sodium: 140 mmol/L (ref 135–145)

## 2021-10-07 LAB — HEMOGLOBIN AND HEMATOCRIT, BLOOD
HCT: 25 % — ABNORMAL LOW (ref 39.0–52.0)
Hemoglobin: 8.6 g/dL — ABNORMAL LOW (ref 13.0–17.0)

## 2021-10-07 LAB — IRON AND TIBC
Iron: 90 ug/dL (ref 45–182)
Saturation Ratios: 28 % (ref 17.9–39.5)
TIBC: 321 ug/dL (ref 250–450)
UIBC: 231 ug/dL

## 2021-10-07 LAB — PREPARE RBC (CROSSMATCH)

## 2021-10-07 LAB — FERRITIN: Ferritin: 159 ng/mL (ref 24–336)

## 2021-10-07 MED ORDER — OXYCODONE-ACETAMINOPHEN 5-325 MG PO TABS
1.0000 | ORAL_TABLET | Freq: Three times a day (TID) | ORAL | Status: DC | PRN
  Administered 2021-10-08 – 2021-10-10 (×3): 1 via ORAL
  Filled 2021-10-07 (×3): qty 1

## 2021-10-07 MED ORDER — PANTOPRAZOLE SODIUM 40 MG PO TBEC
40.0000 mg | DELAYED_RELEASE_TABLET | Freq: Two times a day (BID) | ORAL | Status: DC
  Administered 2021-10-08 – 2021-10-10 (×4): 40 mg via ORAL
  Filled 2021-10-07 (×4): qty 1

## 2021-10-07 MED ORDER — ATORVASTATIN CALCIUM 40 MG PO TABS
40.0000 mg | ORAL_TABLET | Freq: Every day | ORAL | Status: DC
  Administered 2021-10-07 – 2021-10-10 (×4): 40 mg via ORAL
  Filled 2021-10-07 (×5): qty 1

## 2021-10-07 MED ORDER — HYDROMORPHONE HCL 1 MG/ML IJ SOLN
0.2500 mg | INTRAMUSCULAR | Status: DC | PRN
  Administered 2021-10-07: 0.25 mg via INTRAVENOUS
  Filled 2021-10-07: qty 1

## 2021-10-07 MED ORDER — GABAPENTIN 100 MG PO CAPS
100.0000 mg | ORAL_CAPSULE | Freq: Three times a day (TID) | ORAL | Status: DC
  Administered 2021-10-07 – 2021-10-10 (×8): 100 mg via ORAL
  Filled 2021-10-07 (×9): qty 1

## 2021-10-07 MED ORDER — SODIUM CHLORIDE 0.9% IV SOLUTION: Freq: Once | INTRAVENOUS | Status: DC

## 2021-10-07 NOTE — Progress Notes (Signed)
Dinner meal delivered patient said did not want it  had said the same for breakfast

## 2021-10-07 NOTE — Progress Notes (Addendum)
Called RR patient verbalizing just doesn't feel good and crying  started trembling vtal within limits CBG 103.  Patient started verbalizing doent want to go to dialysis but would not say why.  Asked me to call sister and he spoke to her.  Patient agreeable to go to dialysis  RR nurse arrived spoke tohim about importance of dialysis and patient taken to dialysis

## 2021-10-07 NOTE — Evaluation (Signed)
Occupational Therapy Evaluation Patient Details Name: Daniel [name] MRN: [id number] DOB: [birth year] Today's Date: 10/07/2021   History of Present Illness Pt is a 61 y/o M presenting to ED on 6/10 with dark stool and epistaxis; recent hospitalization from 6/3-6/4 for symptomatic anemia in the setting of recurrent GI bleed and multifocal PNA. PMH includes ESRD on HD MWF, recurrent GIB, duodenal ulcer, gastric ulcer, small bowel AVM, PAD, chronic pain syndrome, DM2, orthostatic hypotension, tobacco use disorder, anxiety, and L BKA.   Clinical Impression   Pt questionable historian, PLOF and home info obtained per chart review as pt frequently stating " I don't know" when asked. Pt uses w/c at baseline and transfers mostly, completes ADLs independently, and uses transportation services. Pt limited by pain at this time, continuously rocking at EOB despite premedication. Pt supervision for bed mobility, and mod I -mod A for ADLs, pt declining OOB mobility at this time. Pt presenting with impairments listed below, will follow acutely. Recommend HHOT at d/c pending pt progress.      Recommendations for follow up therapy are one component of a multi-disciplinary discharge planning process, led by the attending physician.  Recommendations may be updated based on patient status, additional functional criteria and insurance authorization.   Follow Up Recommendations  Home health OT    Assistance Recommended at Discharge Set up Supervision/Assistance  Patient can return home with the following A little help with walking and/or transfers;A lot of help with bathing/dressing/bathroom;Assistance with cooking/housework;Assist for transportation;Help with stairs or ramp for entrance    Functional Status Assessment  Patient has had a recent decline in their functional status and demonstrates the ability to make significant improvements in function in a reasonable and predictable amount of time.  Equipment  Recommendations  BSC/3in1    Recommendations for Other Services PT consult     Precautions / Restrictions Precautions Precautions: Fall Restrictions Weight Bearing Restrictions: No      Mobility Bed Mobility Overal bed mobility: Needs Assistance Bed Mobility: Rolling, Sit to Sidelying, Sidelying to Sit   Sidelying to sit: Supervision     Sit to sidelying: Supervision General bed mobility comments: pt rolling back and forth, sitting up on L and R sides of bed    Transfers                   General transfer comment: deferred, pt declining      Balance Overall balance assessment: Needs assistance Sitting-balance support: Feet supported, Bilateral upper extremity supported Sitting balance-Leahy Scale: Good Sitting balance - Comments: can reach outside BOS                                   ADL either performed or assessed with clinical judgement   ADL Overall ADL's : Needs assistance/impaired Eating/Feeding: Modified independent;Sitting;Bed level   Grooming: Modified independent;Sitting;Bed level   Upper Body Bathing: Moderate assistance;Sitting Upper Body Bathing Details (indicate cue type and reason): applying lotion to back Lower Body Bathing: Supervison/ safety;Sitting/lateral leans Lower Body Bathing Details (indicate cue type and reason): applying lotion to BLE's Upper Body Dressing : Min guard;Sitting   Lower Body Dressing: Moderate assistance;Sitting/lateral leans   Toilet Transfer: Moderate assistance;Rolling walker (2 wheels);Squat-pivot   Toileting- Water quality scientist and Hygiene: Minimal assistance;Sitting/lateral lean       Functional mobility during ADLs: Moderate assistance;Cueing for safety;Cueing for sequencing;Rolling walker (2 wheels)       Vision  Vision Assessment?: No apparent visual deficits     Perception     Praxis      Pertinent Vitals/Pain Pain Assessment Pain Assessment: Faces Pain Score: 8   Faces Pain Scale: Hurts whole lot Pain Location: back Pain Descriptors / Indicators: Discomfort, Constant (itching) Pain Intervention(s): Limited activity within patient's tolerance, Monitored during session, Premedicated before session, Repositioned     Hand Dominance     Extremity/Trunk Assessment Upper Extremity Assessment Upper Extremity Assessment: RUE deficits/detail RUE Deficits / Details: decreased shoulder ROM in supine, will further assess   Lower Extremity Assessment Lower Extremity Assessment: Defer to PT evaluation   Cervical / Trunk Assessment Cervical / Trunk Assessment: Normal   Communication Communication Communication: No difficulties   Cognition Arousal/Alertness: Awake/alert Behavior During Therapy: WFL for tasks assessed/performed Overall Cognitive Status: No family/caregiver present to determine baseline cognitive functioning                                 General Comments: pt following commands consistently, unable to provide PLOF     General Comments  VSS on RA    Exercises     Shoulder Instructions      Home Living Family/patient expects to be discharged to:: Private residence Living Arrangements: Alone Available Help at Discharge: Family;Available PRN/intermittently Type of Home: House Home Access: Ramped entrance           Bathroom Shower/Tub: Occupational psychologist: Standard     Home Equipment: Wheelchair - manual   Additional Comments: home info obtained per chart review, pt continues to state "I don't know" when asked PLOF /home set up questions      Prior Functioning/Environment Prior Level of Function : Needs assist;Driving             Mobility Comments: transfers only no ambulation per patient ADLs Comments: set up assist        OT Problem List: Decreased strength;Decreased activity tolerance;Decreased safety awareness;Decreased cognition;Decreased knowledge of use of DME or AE;Pain       OT Treatment/Interventions: Self-care/ADL training;Therapeutic exercise;Therapeutic activities;Patient/family education;Balance training;Energy conservation    OT Goals(Current goals can be found in the care plan section) Acute Rehab OT Goals Patient Stated Goal: none stated OT Goal Formulation: With patient Time For Goal Achievement: 10/21/21 Potential to Achieve Goals: Good ADL Goals Pt Will Perform Upper Body Dressing: with modified independence;sitting Pt Will Perform Lower Body Dressing: with min guard assist;sit to/from stand;sitting/lateral leans Pt Will Transfer to Toilet: with min guard assist;bedside commode;squat pivot transfer Pt Will Perform Tub/Shower Transfer: with min guard assist;Tub transfer;Shower transfer;shower seat;Squat pivot transfer;Stand pivot transfer  OT Frequency: Min 2X/week    Co-evaluation              AM-PAC OT "6 Clicks" Daily Activity     Outcome Measure Help from another person eating meals?: None Help from another person taking care of personal grooming?: None Help from another person toileting, which includes using toliet, bedpan, or urinal?: A Lot Help from another person bathing (including washing, rinsing, drying)?: A Little Help from another person to put on and taking off regular upper body clothing?: A Little Help from another person to put on and taking off regular lower body clothing?: A Lot 6 Click Score: 18   End of Session Nurse Communication: Mobility status  Activity Tolerance: Patient tolerated treatment well Patient left: in bed;with call bell/phone within reach;with bed alarm  set  OT Visit Diagnosis: Muscle weakness (generalized) (M62.81)                Time: 1583-0940 OT Time Calculation (min): 22 min Charges:  OT General Charges $OT Visit: 1 Visit OT Evaluation $OT Eval Low Complexity: 1 Low  Lynnda Child, OTD, OTR/L Acute Rehab [phone] [place] 10/07/2021, 5:07 PM

## 2021-10-07 NOTE — Progress Notes (Signed)
OT Cancellation Note  Patient Details Name: Daniel [name] MRN: [id number] DOB: April 08, 1961   Cancelled Treatment:    Reason Eval/Treat Not Completed: Patient at procedure or test/ unavailable (Pt in HD, will  follow up as schedule allows)  Lynnda Child, OTD, OTR/L Acute Rehab [phone]   Kaylyn Lim 10/07/2021, 9:10 AM

## 2021-10-07 NOTE — Plan of Care (Signed)
  Problem: Elimination: Goal: Will not experience complications related to urinary retention Outcome: Progressing   Problem: Nutrition: Goal: Adequate nutrition will be maintained Outcome: Not Progressing   Problem: Coping: Goal: Level of anxiety will decrease Outcome: Not Progressing

## 2021-10-07 NOTE — Progress Notes (Signed)
[facility] Progress Note   Subjective:   Seen on HD. C/o left sided flank pain. Reports pain is not new and was present prior to admission. Hgb 7.2 this AM. RN reports he will be getting PRBC on HD. Denies SOB, CP and dizziness.   Objective Vitals:   10/07/21 0828 10/07/21 0902 10/07/21 0917 10/07/21 0920  BP: 129/75 (!) 120/56  (!) 106/92  Pulse: 92 87  85  Resp: '18 16  10  '$ Temp: 97.7 F (36.5 C) 98.5 F (36.9 C)    TempSrc: Oral Oral    SpO2: 94%   94%  Weight:   110.2 kg   Height:       Physical Exam General: WDWN male, alert, appears uncomfortable Heart: RRR, no murmurs, rubs or gallops Lungs: CTA bilaterally without wheezing, rhonchi or rales Abdomen: Non-distended, +BS. Palpation deferred Extremities: L BKA. No edema b/l lower extremities Dialysis Access: LUE AVF accessed  Additional Objective Labs: Basic Metabolic Panel: Recent Labs  Lab 10/05/21 0729 10/06/21 0447 10/06/21 0755 10/07/21 0244  NA 143 139 140 140  K 4.4 5.8* 5.0 5.3*  CL 96* 96* 101 99  CO2 28 24  --  22  GLUCOSE 96 81 82 95  BUN 41* 52* 49* 57*  CREATININE 5.96* 7.00* 7.50* 7.86*  CALCIUM 9.0 9.0  --  8.8*  PHOS  --   --   --  5.2*   Liver Function Tests: Recent Labs  Lab 10/03/21 0900 10/05/21 0729 10/06/21 0447 10/07/21 0244  AST 38 46* 48*  --   ALT '11 16 15  '$ --   ALKPHOS 144* 124 110  --   BILITOT 0.7 0.8 0.8  --   PROT 8.3* 7.5 7.1  --   ALBUMIN 3.2* 2.3* 2.3* 2.4*   No results for input(s): "LIPASE", "AMYLASE" in the last 168 hours. CBC: Recent Labs  Lab 10/03/21 0900 10/05/21 0729 10/05/21 1602 10/06/21 0447 10/06/21 0755 10/06/21 0935 10/07/21 0244  WBC 5.3 4.8  --  5.2  --   --  5.5  NEUTROABS 2.9  --   --   --   --   --  3.1  HGB 9.0* 7.3*   < > 7.5* 8.2* 7.6* 7.2*  HCT 26.7* 21.8*   < > 22.5* 24.0* 22.7* 21.7*  MCV 97.4 98.2  --  96.6  --   --  99.5  PLT 142* 146*  --  141*  --   --  155   < > = values in this interval not displayed.    Blood Culture    Component Value Date/Time   SDES BLOOD BLOOD RIGHT HAND 08/16/2021 2049   SPECREQUEST  08/16/2021 2049    BOTTLES DRAWN AEROBIC AND ANAEROBIC Blood Culture adequate volume   CULT  08/16/2021 2049    NO GROWTH 5 DAYS Performed at [facility], [place] [address] [place], [place] [address]    REPTSTATUS 08/21/2021 FINAL 08/16/2021 2049    Cardiac Enzymes: No results for input(s): "CKTOTAL", "CKMB", "CKMBINDEX", "TROPONINI" in the last 168 hours. CBG: Recent Labs  Lab 10/06/21 0859 10/07/21 0829  GLUCAP 78 103*   Iron Studies:  Recent Labs    10/07/21 0244  IRON 90  TIBC 321  FERRITIN 159   '@lablastinr3'$ @ Studies/Results: DG Abd Portable 1V  Result Date: 10/05/2021 CLINICAL DATA:  Bilateral shoulder pain and dark colored stools. EXAM: PORTABLE ABDOMEN - 1 VIEW COMPARISON:  October 22, 2019 FINDINGS: Multiple overlying radiopaque cardiac  lead wires are seen. The bowel gas pattern is normal. No radio-opaque calculi or other significant radiographic abnormality are seen. IMPRESSION: Negative. Electronically Signed   By: Virgina Norfolk M.D.   On: 10/05/2021 15:36   Medications:  octreotide (SANDOSTATIN) 500 mcg in sodium chloride 0.9 % 250 mL (2 mcg/mL) infusion 50 mcg/hr (10/07/21 0009)   pantoprazole 8 mg/hr (10/07/21 0913)    sodium chloride   Intravenous Once   nicotine  14 mg Transdermal Daily   [START ON 10/08/2021] pantoprazole  40 mg Intravenous Q12H    Dialysis Orders: MWF- [facility] ([place]) 4hrs, BFR 450, DFR 500,  EDW 109kg, 2K/ 2Ca No Heparin bolus No Micera recently ordered  Assessment/Plan: Acute on chronic blood loss anemia-GI following; s/p small bowel enteroscopy yesterday. Reports ongoing abdominal pain. Continue to hold ASA/AC; On Octreotide and protonix. Management per primary team/GI ESRD - on HD MWF. Plan for HD today per his usual schedule. Hypertension/volume- Euvolemic on exam and Bps stable. Appears  patient has been reaching his EDW in outpatient. UF as tolerated. Anemia of CKD -Hgb now 7.2. See above. Not on ESA outpatient due to hepatocellular carcinoma. Continue to transfuse PRN.  Secondary Hyperparathyroidism - Calcium and phos controlled. Not on VDRA outpatient. Monitor trends here. Nutrition - Advance to renal diet when clinically stable  Anice Paganini, PA-C 10/07/2021, 9:36 AM  [facility] Pager: [phone]

## 2021-10-07 NOTE — Progress Notes (Signed)
Daily Rounding Note  10/07/2021, 3:57 PM  LOS: 2 days   SUBJECTIVE:   Chief complaint:    Acute on chronic blood loss anemia.  Intestinal, gastric AVMs.  Patient has not had a bowel movement for at least a couple of days.  He is complaining of shoulder pain and pain in his amputated leg. No nausea or vomiting.  Tenderness on exam in the right upper quadrant which is not new, no spontaneous pain in that location and no postprandial pain.  OBJECTIVE:         Vital signs in last 24 hours:    Temp:  [97.1 F (36.2 C)-98.5 F (36.9 C)] 98.5 F (36.9 C) (06/12 1331) Pulse Rate:  [81-92] 88 (06/12 1331) Resp:  [9-20] 18 (06/12 1331) BP: (106-158)/(51-92) 156/83 (06/12 1331) SpO2:  [94 %-100 %] 94 % (06/12 1331) Weight:  [108.5 kg-110.2 kg] 108.5 kg (06/12 1304) Last BM Date : 10/06/21 Filed Weights   10/06/21 0738 10/07/21 0917 10/07/21 1304  Weight: 108.8 kg 110.2 kg 108.5 kg   General: Obese.  Looks a bit uncomfortable.  Cooperative.  Looks chronically unwell. Heart: RRR. Chest: No labored breathing or cough Abdomen: Obese.  Soft.  Active bowel sounds.  Tenderness without guarding or rebound in the right upper quadrant.  No masses, HSM, bruits, hernias Extremities: Left BKA.   Neuro/Psych:  cooperative, no confusion.    Intake/Output from previous day: 06/11 0701 - 06/12 0700 In: 1185.6 [P.O.:240; I.V.:945.6] Out: 1025 [Urine:1025]  Intake/Output this shift: Total I/O In: 438 [I.V.:438] Out: 1.7 [Other:1.7]  Lab Results: Recent Labs    10/05/21 0729 10/05/21 1602 10/06/21 0447 10/06/21 0755 10/06/21 0935 10/07/21 0244 10/07/21 1442  WBC 4.8  --  5.2  --   --  5.5  --   HGB 7.3*   < > 7.5*   < > 7.6* 7.2* 8.6*  HCT 21.8*   < > 22.5*   < > 22.7* 21.7* 25.0*  PLT 146*  --  141*  --   --  155  --    < > = values in this interval not displayed.   BMET Recent Labs    10/05/21 0729 10/06/21 0447  10/06/21 0755 10/07/21 0244  NA 143 139 140 140  K 4.4 5.8* 5.0 5.3*  CL 96* 96* 101 99  CO2 28 24  --  22  GLUCOSE 96 81 82 95  BUN 41* 52* 49* 57*  CREATININE 5.96* 7.00* 7.50* 7.86*  CALCIUM 9.0 9.0  --  8.8*   LFT Recent Labs    10/05/21 0729 10/06/21 0447 10/07/21 0244  PROT 7.5 7.1  --   ALBUMIN 2.3* 2.3* 2.4*  AST 46* 48*  --   ALT 16 15  --   ALKPHOS 124 110  --   BILITOT 0.8 0.8  --    PT/INR Recent Labs    10/05/21 0729  LABPROT 14.0  INR 1.1   Hepatitis Panel Recent Labs    10/06/21 1311  HEPBSAG NON REACTIVE  HCVAB Reactive*    Studies/Results: No results found.  Scheduled Meds:  sodium chloride   Intravenous Once   atorvastatin  40 mg Oral Daily   gabapentin  100 mg Oral TID   nicotine  14 mg Transdermal Daily   [START ON 10/08/2021] pantoprazole  40 mg Intravenous Q12H   Continuous Infusions:  octreotide (SANDOSTATIN) 500 mcg in sodium chloride 0.9 % 250 mL (2 mcg/mL)  infusion 50 mcg/hr (10/07/21 1554)   pantoprazole 8 mg/hr (10/07/21 1554)   PRN Meds:.acetaminophen **OR** acetaminophen, camphor-menthol, HYDROmorphone (DILAUDID) injection, hydrOXYzine, oxyCODONE-acetaminophen   ASSESMENT:   Melena, heme positive stools, ongoing bleeding for a couple of months.  Previous studies including but not limited to SBE 3/10, 4/11, 4/29, 5/21.  VCE 4/30.  Flexible sigmoidoscopy 3/10. 10/06/2020 SBE.  Mild portal hypertensive gastropathy, friable gastric polyps at pylorus treated with APC.  Blood at the duodenal bulb and fourth duodenum.  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  Another nonbleeding duodenal AVM treated with APC.  Multiple duodenal polyps, not biopsied.  Previously placed tattoo site seen at jejunum.  VCE capsule placed.  ESRD.  Dialysis MWF.  Chronic anemia. Prior PRBC transfusion.  2 PRBCs this admission.  Hgb 7.3..  7.2 this morning, 2nd PRBC ... 8.6 at recheck this afternoon.  Chronic pain syndrome.   PLAN   72-hour  PPI drip set to finish tomorrow at 11:30 AM.  Transition to oral Protonix 40 mg daily after that.  ? transition to scheduled or Depo octreotide.  Currently day 3 octreotide drip.  Dr. Candis Schatz noted he should be considered for outpatient octreotide therapy to reduce bleeding from intestinal AVMs.  However it is very difficult to get this medication approved for this indication.  Await reading of capsule endoscopy.  Today or tmrw.  Dr. Carlean Purl aware     Azucena Freed  10/07/2021, 3:57 PM Phone [phone]

## 2021-10-07 NOTE — Consult Note (Addendum)
   [facility] CM Inpatient Consult   10/07/2021  SHNEUR WHITTENBURG [birth year] [id number]  [place] [facility] [ACO] Patient: Medicare ACO REACH  Primary Care Provider:  [name], NP, [facility] which is listed to provide the Transition of Care follow up  10/08/21 11:20 am Met with patient at the bedside, sitting in geri chair.  Explained reason for the visit. He has endorsed his PCP and also a follow up with [facility] NP-G for a visit next week.  SDOH reviewed and he states no new changes. Gave patient an appointment reminder card with Kayleen Memos, NP contact information for follow up.  He states he has spoken with her today.  Patient is currently active with [facility] for chronic disease management services.  Patient has been had several engagement attempts by a [facility] NP-G.  Our community based plan of care has focused on disease management and community resource support.    Patient is currently in HD    2:35 pm round on unit patient back to room however, lab in trying to get the patient to agree to lab draw.  Continue to follow.  Plan: Continue to follow for needs and assure contact information is accurate.  Of note, [facility] Care Management services does not replace or interfere with any services that are needed or arranged by inpatient [facility] care management team.  For additional questions or referrals please contact:  Natividad Brood, RN BSN [facility] Liaison  [phone] business mobile phone Toll free office [phone]  Fax number: [phone] [email or web address]

## 2021-10-07 NOTE — Progress Notes (Signed)
PT Cancellation Note  Patient Details Name: Daniel [name] MRN: [id number] DOB: [birth year]   Cancelled Treatment:    Reason Eval/Treat Not Completed: Patient at procedure or test/unavailable  Currently in HD;  Will follow up later today as time allows;  Otherwise, will follow up for PT tomorrow;   Thank you,  Roney Marion, PT  Acute Rehabilitation Services Pager [phone] Office [place] 10/07/2021, 10:30 AM

## 2021-10-07 NOTE — Progress Notes (Signed)
PROGRESS NOTE  Daniel [name] IRJ:[id number] DOB: 09/02/1960   PCP: [name], NP  Patient is from: Home.  Lives alone.  Uses wheelchair at baseline.  DOA: 10/05/2021 LOS: 2  Chief complaints Chief Complaint  Patient presents with   Rectal Bleeding   Shoulder Pain     Brief Narrative / Interim history: 61 year old M with PMH of ESRD on HD MWF, IDA, recurrent GIB, duodenal ulcer, gastric ulcer, small bowel AVM, PAD, chronic pain syndrome, DM-2, orthostatic hypotension, EtOH use, tobacco use disorder, anxiety, left BKA, QTc and recent hospitalization from 6/3-6/4 for symptomatic anemia in the setting of recurrent GI bleed and multifocal pneumonia returning with ongoing melena, and admitted for acute on chronic blood loss anemia.  Hgb 7.3 (9.0 two days prior and 11.3 six days prior).  Seems he is on low-dose aspirin.  Also question about compliance with PPI.   Patient was transfused 1 unit.  GI consulted.  He underwent a small bowel endoscopy that showed normal esophagus, mild PHG, few friable gastric polyps at the pylorus (treated with APC), blood in duodenal bulb and first portion of duodenum, a single recently bleeding AVM in the duodenum (treated with APC), a single bleeding AVM in duodenum (treated with APC) and multiple duodenal polyps.  Capsule endoscopy underway to evaluate AVM burden.  GI recommends considering outpatient octreotide therapy.   Subjective: Seen and examined earlier this morning while on dialysis.  He says he does not feel good but not able to provide a specific reason.  He responds yes to pain.  He does not appear to be in much distress.  Objective: Vitals:   10/07/21 1257 10/07/21 1304 10/07/21 1331 10/07/21 1630  BP: (!) 143/51  (!) 156/83 (!) 170/108  Pulse:   88 84  Resp:   18 19  Temp: 97.8 F (36.6 C)  98.5 F (36.9 C) 98 F (36.7 C)  TempSrc: Oral   Oral  SpO2:   94% 94%  Weight:  108.5 kg    Height:        Examination:  GENERAL: No  apparent distress.  Nontoxic. HEENT: MMM.  Vision and hearing grossly intact.  NECK: Supple.  No apparent JVD.  RESP:  No IWOB.  Fair aeration bilaterally. CVS:  RRR. Heart sounds normal.  ABD/GI/GU: BS+. Abd soft.  Diffuse tenderness, mainly on the right. MSK/EXT: Left AKA stump with healing wound.  No signs of infection.  Trace RLE edema.  Fairly normal. SKIN: no apparent skin lesion or wound NEURO: Awake and alert. Oriented appropriately.  No apparent focal neuro deficit. PSYCH: Calm.  No distress or agitation.  Procedures:  6/11-small bowel endoscopy as above. 6/11-capsule endoscopy  Microbiology summarized: None  Assessment and plan: Principal Problem:   Acute on chronic blood loss anemia Active Problems:   Melena   ESRD (end stage renal disease) on dialysis (HCC)   Hepatocellular carcinoma (HCC)   Essential hypertension   AVM (arteriovenous malformation) of small bowel, acquired with hemorrhage   Prolonged QT interval   Upper GI bleed   Thrombocytopenia (HCC)   AVM (arteriovenous malformation) of duodenum, acquired   Tobacco use disorder   Chronic diastolic CHF (congestive heart failure) (HCC)   Recurrent gastrointestinal hemorrhage   Chronic pain disorder   Rotator cuff syndrome   Epistaxis   Hx of BKA, left (HCC)   Intestinal polyps   Portal hypertensive gastropathy (HCC)   Hyperkalemia   Abdominal pain  Acute on chronic blood loss anemia in the  setting of recurrent upper GI bleed Duodenal AVM/PHG/gastritis-noted on his small bowel endoscopy again.  Endoscopy as above. Hemodynamically stable.  Patient is on low-dose aspirin for PAD.  Denies other NSAID use.  Not sure if he is compliant with his Protonix.  Supposed to take twice daily.  Transfused 1 unit on 6/10 and another 1 on 6/12.  Anemia panel with iron deficiency. Recent Labs    09/29/21 0454 10/03/21 0900 10/05/21 0729 10/05/21 1602 10/05/21 2043 10/06/21 0447 10/06/21 0755 10/06/21 0935  10/07/21 0244 10/07/21 1442  HGB 11.3* 9.0* 7.3* 7.7* 7.3* 7.5* 8.2* 7.6* 7.2* 8.6*  -Continue holding aspirin -Defer IV iron to nephrology. -Continue Protonix and octreotide.  -Check CBC in the morning unless obvious bleeding -Appreciate input by GI-capsule to evaluate AVM burden and consideration of outpatient octreotide treatment -Capsule endoscopy results pending.  Abdominal pain/tenderness: seems to have more tenderness in LLQ. -May consider CT abdomen and pelvis without contrast if no improvement  Acute bilateral shoulder pain: Likely due to chronic rotator cuff tears and degenerative changes of acromioclavicular joint as noted on his MRIs on 09/17/2021.  Pain improved. -Pain control with Tylenol, Percocet and IV Dilaudid   ESRD on HD MWF-reports full session HD on Friday. -Notified nephrology -No need for urgent dialysis.   Chronic diastolic CHF: TTE in 12/4707 with LVEF of 55 to 60%, no RWMA and RVSP of 48.4.  Appears euvolemic on exam.  Making some urine as well.  Not on diuretics. -Fluid management with dialysis -Hold home metoprolol, [place]   History of hepatocellular carcinoma -Outpatient follow-up.   Left BKA-appears stable. -Continue Ace wrap   Tobacco use disorder: Smokes about half a pack a day.  Encouraged cessation. -Continue nicotine patch  Mild thrombocytopenia: Monitor.  Hyperkalemia: Likely due to renal failure. -Per nephrology.  Epistaxis: Prior to arrival.  Resolved.  Obesity:  Body mass index is 33.36 kg/m.  Could be underestimated by BKA       DVT prophylaxis:  SCDs Start: 10/05/21 1229  Code Status: Full code Family Communication: None at bedside. Level of care: Telemetry Medical Status is: Inpatient Remains inpatient appropriate because: Acute blood loss anemia   Final disposition: Likely home once medically cleared Consultants:  Gastroenterology Nephrology  Sch Meds:  Scheduled Meds:  sodium chloride   Intravenous Once    atorvastatin  40 mg Oral Daily   gabapentin  100 mg Oral TID   nicotine  14 mg Transdermal Daily   [START ON 10/08/2021] pantoprazole  40 mg Oral BID   Continuous Infusions:  octreotide (SANDOSTATIN) 500 mcg in sodium chloride 0.9 % 250 mL (2 mcg/mL) infusion 50 mcg/hr (10/07/21 1554)   pantoprazole 8 mg/hr (10/07/21 1554)   PRN Meds:.acetaminophen **OR** acetaminophen, camphor-menthol, HYDROmorphone (DILAUDID) injection, hydrOXYzine, oxyCODONE-acetaminophen  Antimicrobials: Anti-infectives (From admission, onward)    None        I have personally reviewed the following labs and images: CBC: Recent Labs  Lab 10/03/21 0900 10/05/21 0729 10/05/21 1602 10/06/21 0447 10/06/21 0755 10/06/21 0935 10/07/21 0244 10/07/21 1442  WBC 5.3 4.8  --  5.2  --   --  5.5  --   NEUTROABS 2.9  --   --   --   --   --  3.1  --   HGB 9.0* 7.3*   < > 7.5* 8.2* 7.6* 7.2* 8.6*  HCT 26.7* 21.8*   < > 22.5* 24.0* 22.7* 21.7* 25.0*  MCV 97.4 98.2  --  96.6  --   --  99.5  --   PLT 142* 146*  --  141*  --   --  155  --    < > = values in this interval not displayed.   BMP &GFR Recent Labs  Lab 10/03/21 0900 10/05/21 0729 10/06/21 0447 10/06/21 0755 10/07/21 0244  NA 137 143 139 140 140  K 4.6 4.4 5.8* 5.0 5.3*  CL 96* 96* 96* 101 99  CO2 '22 28 24  '$ --  22  GLUCOSE 97 96 81 82 95  BUN 46* 41* 52* 49* 57*  CREATININE 8.09* 5.96* 7.00* 7.50* 7.86*  CALCIUM 9.7 9.0 9.0  --  8.8*  PHOS  --   --   --   --  5.2*   Estimated Creatinine Clearance: 12.4 mL/min (A) (by C-G formula based on SCr of 7.86 mg/dL (H)). Liver & Pancreas: Recent Labs  Lab 10/03/21 0900 10/05/21 0729 10/06/21 0447 10/07/21 0244  AST 38 46* 48*  --   ALT '11 16 15  '$ --   ALKPHOS 144* 124 110  --   BILITOT 0.7 0.8 0.8  --   PROT 8.3* 7.5 7.1  --   ALBUMIN 3.2* 2.3* 2.3* 2.4*   No results for input(s): "LIPASE", "AMYLASE" in the last 168 hours. No results for input(s): "AMMONIA" in the last 168 hours. Diabetic: No  results for input(s): "HGBA1C" in the last 72 hours. Recent Labs  Lab 10/06/21 0859 10/07/21 0829  GLUCAP 78 103*   Cardiac Enzymes: No results for input(s): "CKTOTAL", "CKMB", "CKMBINDEX", "TROPONINI" in the last 168 hours. No results for input(s): "PROBNP" in the last 8760 hours. Coagulation Profile: Recent Labs  Lab 10/05/21 0729  INR 1.1   Thyroid Function Tests: No results for input(s): "TSH", "T4TOTAL", "FREET4", "T3FREE", "THYROIDAB" in the last 72 hours. Lipid Profile: No results for input(s): "CHOL", "HDL", "LDLCALC", "TRIG", "CHOLHDL", "LDLDIRECT" in the last 72 hours. Anemia Panel: Recent Labs    10/05/21 1601 10/05/21 1602 10/07/21 0244  VITAMINB12  --  303  --   FOLATE 9.0  --   --   FERRITIN  --  153 159  TIBC  --  298 321  IRON  --  188* 90  RETICCTPCT 3.0  --   --    Urine analysis:    Component Value Date/Time   COLORURINE YELLOW 07/31/2019 2128   APPEARANCEUR CLEAR 07/31/2019 2128   LABSPEC 1.009 07/31/2019 2128   PHURINE 5.0 07/31/2019 2128   GLUCOSEU NEGATIVE 07/31/2019 2128   HGBUR SMALL (A) 07/31/2019 2128   BILIRUBINUR NEGATIVE 07/31/2019 2128   KETONESUR NEGATIVE 07/31/2019 2128   PROTEINUR 100 (A) 09/03/2021 1150   UROBILINOGEN 0.2 12/31/2009 0746   NITRITE NEGATIVE 07/31/2019 2128   LEUKOCYTESUR NEGATIVE 07/31/2019 2128   Sepsis Labs: Invalid input(s): "PROCALCITONIN", "LACTICIDVEN"  Microbiology: No results found for this or any previous visit (from the past 240 hour(s)).  Radiology Studies: No results found.    Alianis Trimmer T. [place]  If 7PM-7AM, please contact night-coverage [email or web address] 10/07/2021, 4:41 PM

## 2021-10-08 ENCOUNTER — Inpatient Hospital Stay (HOSPITAL_COMMUNITY): Payer: Medicare Other

## 2021-10-08 ENCOUNTER — Other Ambulatory Visit: Payer: Self-pay | Admitting: *Deleted

## 2021-10-08 DIAGNOSIS — R5381 Other malaise: Secondary | ICD-10-CM

## 2021-10-08 DIAGNOSIS — R4189 Other symptoms and signs involving cognitive functions and awareness: Secondary | ICD-10-CM

## 2021-10-08 DIAGNOSIS — I5033 Acute on chronic diastolic (congestive) heart failure: Secondary | ICD-10-CM

## 2021-10-08 DIAGNOSIS — N186 End stage renal disease: Secondary | ICD-10-CM | POA: Diagnosis not present

## 2021-10-08 DIAGNOSIS — J189 Pneumonia, unspecified organism: Secondary | ICD-10-CM | POA: Insufficient documentation

## 2021-10-08 DIAGNOSIS — C220 Liver cell carcinoma: Secondary | ICD-10-CM | POA: Diagnosis not present

## 2021-10-08 DIAGNOSIS — K921 Melena: Secondary | ICD-10-CM | POA: Diagnosis not present

## 2021-10-08 DIAGNOSIS — R1032 Left lower quadrant pain: Secondary | ICD-10-CM

## 2021-10-08 DIAGNOSIS — D62 Acute posthemorrhagic anemia: Secondary | ICD-10-CM | POA: Diagnosis not present

## 2021-10-08 LAB — TYPE AND SCREEN
ABO/RH(D): B POS
Antibody Screen: NEGATIVE
Unit division: 0
Unit division: 0

## 2021-10-08 LAB — CBC
HCT: 23.9 % — ABNORMAL LOW (ref 39.0–52.0)
HCT: 24.3 % — ABNORMAL LOW (ref 39.0–52.0)
Hemoglobin: 8.1 g/dL — ABNORMAL LOW (ref 13.0–17.0)
Hemoglobin: 8.1 g/dL — ABNORMAL LOW (ref 13.0–17.0)
MCH: 32.4 pg (ref 26.0–34.0)
MCH: 32.4 pg (ref 26.0–34.0)
MCHC: 33.9 g/dL (ref 30.0–36.0)
MCHC: 33.3 g/dL (ref 30.0–36.0)
MCV: 95.6 fL (ref 80.0–100.0)
MCV: 97.2 fL (ref 80.0–100.0)
Platelets: 143 10*3/uL — ABNORMAL LOW (ref 150–400)
Platelets: 147 10*3/uL — ABNORMAL LOW (ref 150–400)
RBC: 2.5 MIL/uL — ABNORMAL LOW (ref 4.22–5.81)
RBC: 2.5 MIL/uL — ABNORMAL LOW (ref 4.22–5.81)
RDW: 20.6 % — ABNORMAL HIGH (ref 11.5–15.5)
RDW: 20.7 % — ABNORMAL HIGH (ref 11.5–15.5)
WBC: 5.1 10*3/uL (ref 4.0–10.5)
WBC: 5 10*3/uL (ref 4.0–10.5)
nRBC: 0 % (ref 0.0–0.2)
nRBC: 0 % (ref 0.0–0.2)

## 2021-10-08 LAB — RENAL FUNCTION PANEL
Albumin: 2.5 g/dL — ABNORMAL LOW (ref 3.5–5.0)
Anion gap: 13 (ref 5–15)
BUN: 29 mg/dL — ABNORMAL HIGH (ref 8–23)
CO2: 24 mmol/L (ref 22–32)
Calcium: 8.5 mg/dL — ABNORMAL LOW (ref 8.9–10.3)
Chloride: 98 mmol/L (ref 98–111)
Creatinine, Ser: 5.37 mg/dL — ABNORMAL HIGH (ref 0.61–1.24)
GFR, Estimated: 11 mL/min — ABNORMAL LOW (ref >60)
Glucose, Bld: 100 mg/dL — ABNORMAL HIGH (ref 70–99)
Phosphorus: 4.5 mg/dL (ref 2.5–4.6)
Potassium: 4.3 mmol/L (ref 3.5–5.1)
Sodium: 135 mmol/L (ref 135–145)

## 2021-10-08 LAB — HEPATITIS B SURFACE ANTIBODY, QUANTITATIVE: Hep B S AB Quant (Post): 29.6 m[IU]/mL (ref >9.9)

## 2021-10-08 LAB — BPAM RBC
Blood Product Expiration Date: 202306232359
Blood Product Expiration Date: 202307042359
ISSUE DATE / TIME: 202306101210
ISSUE DATE / TIME: 202306120951
Unit Type and Rh: 7300
Unit Type and Rh: 7300

## 2021-10-08 LAB — MAGNESIUM: Magnesium: 1.6 mg/dL — ABNORMAL LOW (ref 1.7–2.4)

## 2021-10-08 LAB — BRAIN NATRIURETIC PEPTIDE: B Natriuretic Peptide: 1688.9 pg/mL — ABNORMAL HIGH (ref 0.0–100.0)

## 2021-10-08 LAB — PROCALCITONIN: Procalcitonin: 0.45 ng/mL

## 2021-10-08 LAB — TROPONIN I (HIGH SENSITIVITY)
Troponin I (High Sensitivity): 19 ng/L — ABNORMAL HIGH (ref <18)
Troponin I (High Sensitivity): 17 ng/L (ref <18)

## 2021-10-08 MED ORDER — CHLORHEXIDINE GLUCONATE CLOTH 2 % EX PADS: 6.0000 | MEDICATED_PAD | Freq: Every day | CUTANEOUS | Status: DC

## 2021-10-08 MED ORDER — SODIUM CHLORIDE 0.9 % IV SOLN
510.0000 mg | Freq: Once | INTRAVENOUS | Status: AC
  Administered 2021-10-08: 510 mg via INTRAVENOUS
  Filled 2021-10-08: qty 17

## 2021-10-08 MED ORDER — SODIUM CHLORIDE 0.9 % IV SOLN
2.0000 g | INTRAVENOUS | Status: DC
  Administered 2021-10-08 – 2021-10-09 (×2): 2 g via INTRAVENOUS
  Filled 2021-10-08 (×2): qty 20

## 2021-10-08 MED ORDER — CHLORHEXIDINE GLUCONATE CLOTH 2 % EX PADS
6.0000 | MEDICATED_PAD | Freq: Every day | CUTANEOUS | Status: DC
  Administered 2021-10-08: 6 via TOPICAL

## 2021-10-08 MED ORDER — LIDOCAINE-PRILOCAINE 2.5-2.5 % EX CREA
1.0000 | TOPICAL_CREAM | CUTANEOUS | Status: DC | PRN
Start: 2021-10-08 — End: 2021-10-08

## 2021-10-08 MED ORDER — ANTICOAGULANT SODIUM CITRATE 4% (200MG/5ML) IV SOLN
5.0000 mL | Status: DC | PRN
  Filled 2021-10-08: qty 5

## 2021-10-08 MED ORDER — PENTAFLUOROPROP-TETRAFLUOROETH EX AERO: 1.0000 "application " | INHALATION_SPRAY | CUTANEOUS | Status: DC | PRN

## 2021-10-08 MED ORDER — FUROSEMIDE 10 MG/ML IJ SOLN
80.0000 mg | Freq: Once | INTRAMUSCULAR | Status: AC
Start: 2021-10-08 — End: 2021-10-08
  Administered 2021-10-08: 80 mg via INTRAVENOUS
  Filled 2021-10-08: qty 8

## 2021-10-08 MED ORDER — HEPARIN SODIUM (PORCINE) 1000 UNIT/ML DIALYSIS: 1000.0000 [IU] | INTRAMUSCULAR | Status: DC | PRN

## 2021-10-08 MED ORDER — HYDROMORPHONE HCL 1 MG/ML IJ SOLN
0.5000 mg | INTRAMUSCULAR | Status: DC | PRN
  Administered 2021-10-08 – 2021-10-10 (×3): 0.5 mg via INTRAVENOUS
  Filled 2021-10-08 (×2): qty 1
  Filled 2021-10-08: qty 0.5

## 2021-10-08 MED ORDER — ALTEPLASE 2 MG IJ SOLR
2.0000 mg | Freq: Once | INTRAMUSCULAR | Status: DC | PRN
Start: 2021-10-08 — End: 2021-10-08

## 2021-10-08 MED ORDER — LIDOCAINE HCL (PF) 1 % IJ SOLN
5.0000 mL | INTRAMUSCULAR | Status: DC | PRN
Start: 2021-10-08 — End: 2021-10-08

## 2021-10-08 MED ORDER — DOXYCYCLINE HYCLATE 100 MG PO TABS
100.0000 mg | ORAL_TABLET | Freq: Two times a day (BID) | ORAL | Status: DC
  Administered 2021-10-08 – 2021-10-10 (×5): 100 mg via ORAL
  Filled 2021-10-08 (×5): qty 1

## 2021-10-08 MED ORDER — IOHEXOL 9 MG/ML PO SOLN
ORAL | Status: AC
  Administered 2021-10-08: 500 mL
  Filled 2021-10-08: qty 1000

## 2021-10-08 NOTE — Evaluation (Signed)
Physical Therapy Evaluation Patient Details Name: Daniel [name] MRN: [id number] DOB: January 13, 1961 Today's Date: 10/08/2021  History of Present Illness  Pt is a 61 y/o M presenting to ED on 6/10 with dark stool and epistaxis; recent hospitalization from 6/3-6/4 for symptomatic anemia in the setting of recurrent GI bleed and multifocal PNA. PMH includes ESRD on HD MWF, recurrent GIB, duodenal ulcer, gastric ulcer, small bowel AVM, PAD, chronic pain syndrome, DM2, orthostatic hypotension, tobacco use disorder, anxiety, and L BKA.  Clinical Impression   Pt admitted with above diagnosis. Lives at home alone in what seems like a boarding house type setup; ramped entrance; Prior to admission, pt was able to manage independently at [facility] level (and reports occasionally he has to "hop around" his room; Reports he will be getting his prosthesis next month; Presents to PT with generalized weakness, L LE hip and knee extensor weakness, and hip and knee flexor tightness, effecting functional mobility; Mr. Schauf needs gentle but persistent encouragement to get up and moving; GI workup continues; Highly recommend AIR to maximize independence and safety with mobility and ADLs, and to prep for getting prosthesis in July -- also recommend ST for cognition/memory in the setting of concern re: his safety with med management;  Pt currently with functional limitations due to the deficits listed below (see PT Problem List). Pt will benefit from skilled PT to increase their independence and safety with mobility to allow discharge to the venue listed below.          Recommendations for follow up therapy are one component of a multi-disciplinary discharge planning process, led by the attending physician.  Recommendations may be updated based on patient status, additional functional criteria and insurance authorization.  Follow Up Recommendations Acute inpatient rehab (3hours/day)    Assistance Recommended  at Discharge Set up Supervision/Assistance  Patient can return home with the following  Assist for transportation (Mostly)    Equipment Recommendations Other (comment) (Amputee board (or sliding board) to keep L knee in extension at rest in wheelchair)  Recommendations for Other Services  Rehab consult    Functional Status Assessment Patient has had a recent decline in their functional status and demonstrates the ability to make significant improvements in function in a reasonable and predictable amount of time.     Precautions / Restrictions Precautions Precautions: Fall      Mobility  Bed Mobility               General bed mobility comments: Sitting EOB upon PT arrival    Transfers Overall transfer level: Needs assistance Equipment used: None Transfers: Bed to chair/wheelchair/BSC       Squat pivot transfers: Min assist     General transfer comment: Min assist mostly for line management and safety; Performed squat pivot transfer bed to recliner on pt's R side (side of intact limb); We discussed the possibility of practicing standing and propping L knee on bed or seat of chair to work on upright standing posture and L hip stability in semi-standing    Ambulation/Gait                  Stairs            Wheelchair Mobility    Modified Rankin (Stroke Patients Only)       Balance     Sitting balance-Leahy Scale: Good Sitting balance - Comments: can reach outside BOS  Pertinent Vitals/Pain Pain Assessment Pain Assessment: Faces Faces Pain Scale: Hurts even more Pain Location: L lower abdomen with palpation Pain Descriptors / Indicators: Grimacing Pain Intervention(s): Monitored during session    Home Living Family/patient expects to be discharged to:: Private residence [facility] type situation) Living Arrangements: Alone Available Help at Discharge: Family;Available  PRN/intermittently (Staff, prn) Type of Home: House Home Access: Ramped entrance         Home Equipment: Wheelchair - manual Additional Comments: some home info obtained per chart review, pt tends to state "I don't know" when asked PLOF /home set up questions, but given time, he is able to give more info    Prior Function Prior Level of Function : Needs assist;Driving             Mobility Comments: transfers only no ambulation per patient, escept some hopping in his room; Tells me someone (staff?) assists him with his wheelchair; Likely staff at HD center help with his WC, unsure of how he manages getting wc into car to go to HD, and getting wc out of car once home from HD ADLs Comments: set up assist     Hand Dominance   Dominant Hand: Right    Extremity/Trunk Assessment   Upper Extremity Assessment Upper Extremity Assessment: Defer to OT evaluation    Lower Extremity Assessment Lower Extremity Assessment: Generalized weakness;LLE deficits/detail LLE Deficits / Details: L BKA with hip and knee flexor tightness and hip and knee extensor weakness LLE Sensation:  (Reports occasional phantom pain/sensation)       Communication   Communication: No difficulties  Cognition Arousal/Alertness: Awake/alert Behavior During Therapy: WFL for tasks assessed/performed Overall Cognitive Status: No family/caregiver present to determine baseline cognitive functioning                                 General Comments: pt following commands consistently, making a good effort at answering questions, at times, unable to provide a clear PLOF; Concern for his med management at home        General Comments General comments (skin integrity, edema, etc.): VSS on RA    Exercises Other Exercises Other Exercises: L Hamstring stretch in sitting 3 reps with 20 second hold   Assessment/Plan    PT Assessment Patient needs continued PT services  PT Problem List Decreased  strength;Decreased range of motion;Decreased activity tolerance;Decreased balance;Decreased mobility;Decreased coordination;Decreased cognition;Decreased knowledge of use of DME;Decreased safety awareness;Decreased knowledge of precautions;Pain       PT Treatment Interventions DME instruction;Gait training;Functional mobility training;Therapeutic activities;Therapeutic exercise;Balance training;Neuromuscular re-education;Cognitive remediation;Patient/family education;Wheelchair mobility training    PT Goals (Current goals can be found in the Care Plan section)  Acute Rehab PT Goals Patient Stated Goal: Looking forward to getting his L prosthesis PT Goal Formulation: With patient Time For Goal Achievement: 10/22/21 Potential to Achieve Goals: Good    Frequency Min 3X/week     Co-evaluation               AM-PAC PT "6 Clicks" Mobility  Outcome Measure Help needed turning from your back to your side while in a flat bed without using bedrails?: None Help needed moving from lying on your back to sitting on the side of a flat bed without using bedrails?: A Little Help needed moving to and from a bed to a chair (including a wheelchair)?: A Little Help needed standing up from a chair using your arms (e.g., wheelchair  or bedside chair)?: Total Help needed to walk in hospital room?: Total Help needed climbing 3-5 steps with a railing? : Total 6 Click Score: 13    End of Session Equipment Utilized During Treatment: Gait belt Activity Tolerance: Patient tolerated treatment well Patient left: in chair;with call bell/phone within reach;with chair alarm set Nurse Communication: Mobility status PT Visit Diagnosis: Unsteadiness on feet (R26.81);Other abnormalities of gait and mobility (R26.89);Muscle weakness (generalized) (M62.81)    Time: 3005-1102 PT Time Calculation (min) (ACUTE ONLY): 46 min   Charges:   PT Evaluation $PT Eval Moderate Complexity: 1 Mod PT Treatments $Therapeutic  Exercise: 8-22 mins $Therapeutic Activity: 8-22 mins        Roney Marion, PT  Acute Rehabilitation Services Office [phone]   [place] 10/08/2021, 11:58 AM

## 2021-10-08 NOTE — Procedures (Signed)
Indication:  Recurrent GI bleeding  Findings:  Capsule placed into duodenal bulb w/ scope  Capsule moved back into stomach. It went into duodenum again at one point about 6 hrs 29 mins then back into stomach until battery expired 12 hrs +  Treated polyps, untreated polypsAVM and some oozing blood seen but no new information obtained.  KUB day after showed capsule down into small bowel RLQ.  Would observe and support for now. Appears stable after recent intervention and even if we find lesions distal to area small bowel endoscopy has seen it will be very difficult to treat this (very limited deep enteroscopy options in [place]). Afraid he will have recurrent problems regardless.

## 2021-10-08 NOTE — Progress Notes (Signed)
[facility] Progress Note   Subjective:   Reports he has been short of breath since he woke up with AM, worse when laying down and with exertion. O2 saturation has been >90% on RA. Only 1.7L removed with HD yesterday, unclear why, appears BP was stable. Abdominal x-ray yesterday did show some edema in lung bases.   Objective Vitals:   10/07/21 1630 10/07/21 2039 10/08/21 0605 10/08/21 0934  BP: (!) 170/108 (!) 164/86 (!) 152/83 (!) 150/87  Pulse: 84 85 92 84  Resp: '19 18 16   '$ Temp: 98 F (36.7 C) 98.1 F (36.7 C) 98.3 F (36.8 C) 98.1 F (36.7 C)  TempSrc: Oral Oral    SpO2: 94% 94% 90% 97%  Weight:      Height:       Physical Exam General: Alert male seated in recliner in NAD Heart: RRR, no murmurs Lungs: CTA bilaterally, no wheezing, rhonchi or rales ausculted but stops to catch his breath after talking Abdomen: Soft, non-distended +BS Extremities: L BKA, trace edema RLA Dialysis Access:  LUE AVF + t/b  Additional Objective Labs: Basic Metabolic Panel: Recent Labs  Lab 10/06/21 0447 10/06/21 0755 10/07/21 0244 10/08/21 0424  NA 139 140 140 135  K 5.8* 5.0 5.3* 4.3  CL 96* 101 99 98  CO2 24  --  22 24  GLUCOSE 81 82 95 100*  BUN 52* 49* 57* 29*  CREATININE 7.00* 7.50* 7.86* 5.37*  CALCIUM 9.0  --  8.8* 8.5*  PHOS  --   --  5.2* 4.5   Liver Function Tests: Recent Labs  Lab 10/03/21 0900 10/05/21 0729 10/06/21 0447 10/07/21 0244 10/08/21 0424  AST 38 46* 48*  --   --   ALT '11 16 15  '$ --   --   ALKPHOS 144* 124 110  --   --   BILITOT 0.7 0.8 0.8  --   --   PROT 8.3* 7.5 7.1  --   --   ALBUMIN 3.2* 2.3* 2.3* 2.4* 2.5*   No results for input(s): "LIPASE", "AMYLASE" in the last 168 hours. CBC: Recent Labs  Lab 10/03/21 0900 10/03/21 0900 10/05/21 0729 10/05/21 1602 10/06/21 0447 10/06/21 0755 10/07/21 0244 10/07/21 1442 10/08/21 0424  WBC 5.3   < > 4.8  --  5.2  --  5.5  --  5.1  NEUTROABS 2.9  --   --   --   --   --  3.1  --    --   HGB 9.0*  --  7.3*   < > 7.5*   < > 7.2* 8.6* 8.1*  HCT 26.7*  --  21.8*   < > 22.5*   < > 21.7* 25.0* 23.9*  MCV 97.4  --  98.2  --  96.6  --  99.5  --  95.6  PLT 142*   < > 146*  --  141*  --  155  --  143*   < > = values in this interval not displayed.   Blood Culture    Component Value Date/Time   SDES BLOOD BLOOD RIGHT HAND 08/16/2021 2049   SPECREQUEST  08/16/2021 2049    BOTTLES DRAWN AEROBIC AND ANAEROBIC Blood Culture adequate volume   CULT  08/16/2021 2049    NO GROWTH 5 DAYS Performed at [facility], [place] [address] [place], [place] [address]    REPTSTATUS 08/21/2021 FINAL 08/16/2021 2049    Cardiac Enzymes: No results for input(s): "CKTOTAL", "  CKMB", "CKMBINDEX", "TROPONINI" in the last 168 hours. CBG: Recent Labs  Lab 10/06/21 0859 10/07/21 0829  GLUCAP 78 103*   Iron Studies:  Recent Labs    10/07/21 0244  IRON 90  TIBC 321  FERRITIN 159   '@lablastinr3'$ @ Studies/Results: DG Abd Portable 1V  Result Date: 10/07/2021 CLINICAL DATA:  Rectal bleeding EXAM: PORTABLE ABDOMEN - 1 VIEW COMPARISON:  None Available. FINDINGS: In Skopic capsule noted in the right lower abdomen, likely within distal small bowel loops. No bowel obstruction. No organomegaly or free air. No suspicious calcification. Mild cardiomegaly. Perihilar airspace opacities noted which likely reflects edema. IMPRESSION: Endoscopic capsule in the right lower quadrant, likely in distal small bowel. No bowel obstruction. Electronically Signed   By: Rolm Baptise M.D.   On: 10/07/2021 20:20   Medications:  octreotide (SANDOSTATIN) 500 mcg in sodium chloride 0.9 % 250 mL (2 mcg/mL) infusion 50 mcg/hr (10/08/21 8469)   pantoprazole 8 mg/hr (10/08/21 0617)    sodium chloride   Intravenous Once   atorvastatin  40 mg Oral Daily   gabapentin  100 mg Oral TID   nicotine  14 mg Transdermal Daily   pantoprazole  40 mg Oral BID    Dialysis Orders: MWF- [facility] ([place]) 4hrs,  BFR 450, DFR 500,  EDW 109kg, 2K/ 2Ca No Heparin bolus No Micera recently ordered  Assessment/Plan: Acute on chronic blood loss anemia-GI following; s/p small bowel enteroscopyy. Reports ongoing abdominal pain. Continue to hold ASA/AC; On Octreotide and protonix. Management per primary team/GI ESRD - on HD MWF. Plan for short extra HD today for volume removal, then resume MWF schedule Hypertension/volume- Euvolemic on exam and Bps stable. Appears patient has been reaching his EDW in outpatient. UF as tolerated. Anemia of CKD -Hgb now 8.6. See above. Not on ESA outpatient due to hepatocellular carcinoma. Continue to transfuse PRN.  Secondary Hyperparathyroidism - Calcium and phos controlled. Not on VDRA outpatient. Monitor trends here. Nutrition - Advance to renal diet when clinically stable    Anice Paganini, PA-C 10/08/2021, 10:38 AM  [facility] Pager: [phone]

## 2021-10-08 NOTE — Progress Notes (Addendum)
HEMATOLOGY-ONCOLOGY PROGRESS NOTE  ASSESSMENT AND PLAN: 1.  Acute on chronic blood loss anemia 2.  Duodenal AVM/PHG/gastritis 3.  [place] 4.  Pneumonia 5.  End-stage renal disease 6.  Chronic diastolic CHF 7.  Mild thrombocytopenia 8.  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  Recommend PRBC transfusion for hemoglobin less than 7.5.  Will give a dose of Feraheme 510 mg IV today. -We will reevaluate the patient following hospital discharge for next dose of bevacizumab. -He has outpatient follow-up with interventional radiology later this month for Y90. -Continue management of pneumonia and chronic medical conditions per primary team. -Nephrology following due to end-stage renal disease  Mikey Bussing  SUBJECTIVE: Mr. Cacioppo is followed by our office for [facility] and severe anemia from GI bleed and iron deficiency.  He was recently started on bevacizumab on 09/04/2021.  He received PRBC transfusions as well as IV iron intermittently in our office.  He was also recently referred to IR for consideration of Y90.  Scheduled to undergo the procedure on 6/29.  Currently admitted with acute on chronic blood loss anemia with duodenal AVM/PHG/gastritis.  He is also being treated for pneumonia.  Has been seen by GI and underwent EGD on 10/06/2021.  He was found to have mild portal hypertensive gastropathy and a few friable gastric polyps which were treated, a single recently bleeding angiodysplastic lesion in the duodenum which was treated, multiple duodenal polyps and a single bleeding angiodysplastic lesion in the duodenum which was treated.  He is undergoing capsule endoscopy.  Today, the patient reports that he is not feeling well.  He reports some mild shortness of breath.  He is not having any fevers or chills.  Denies chest pain.  Denies abdominal pain, nausea, vomiting.  No bowel movement today so has not noticed any melena or hematochezia  today.  Has received 2 units PRBC so far this admission.  Hemoglobin 8.1 at noon time.  Oncology History  Hepatocellular carcinoma ([place])  02/07/2021 Imaging   CLINICAL DATA:  Abdominal distension   EXAM: CT ABDOMEN AND PELVIS WITHOUT CONTRAST  IMPRESSION: Cholelithiasis with possible mild gallbladder wall thickening. Correlate for symptoms of right upper quadrant pain and consider further evaluation with gallbladder ultrasound.   Otherwise, no acute findings in the abdomen or pelvis.   Aortic aneurysm NOS (ICD10-I71.9).   02/07/2021 Imaging   CLINICAL DATA:  Cholelithiasis. Question cholecystitis. Abdominal distension.   EXAM: ULTRASOUND ABDOMEN LIMITED RIGHT UPPER QUADRANT  IMPRESSION: 1. Cholelithiasis with associated nonspecific gallbladder wall thickening. No pericholecystic fluid or sonographic Percell Miller sign reported. Gallbladder wall thickening can be seen in the setting of chronic liver disease. Correlate with clinical exam for acute cholecystitis. 2. Limited ultrasound to penetration of sonographic waves.   03/13/2021 Imaging   CLINICAL DATA:  61 year old male with generalized abdominal pain and bright red blood in stool x3 days.   EXAM: CT ABDOMEN AND PELVIS WITHOUT CONTRAST  IMPRESSION: 1. Chronic cholelithiasis, with indistinct appearance of the gallbladder wall similar to the CT last month. However, relatively contracted state of the gallbladder argues against Acute Cholecystitis. But if there is right upper quadrant pain recommend Right Upper Quadrant Ultrasound may be valuable.   2. No other acute or inflammatory process identified in the non-contrast abdomen or pelvis.   3. Diffuse calcified atherosclerosis, Aortic atherosclerosis (ICD10-I70.0). Left nephrolithiasis. Mild large bowel diverticulosis.   07/23/2021 PET scan   IMPRESSION: 1. The ground-glass nodule in the RIGHT lower lobe  is not well demonstrated on the nondiagnostic CT portion the  PET-CT scan. No hypermetabolic lesion present on the PET data set within the RIGHT lower lobe. 2. Bands of intensely hypermetabolic peripheral atelectasis in the medial RIGHT lower lobe and lateral LEFT lower lobe/lingula. Favor benign benign atelectasis or other inflammatory process. Recommend follow-up CT (6 - 8 weeks) to demonstrate resolution. If pattern persists or increased tissue sampling may be warranted. 3. No metastatic adenopathy or distant metastatic disease. 4. Incidental findings of large central non hypermetabolic lesion in the liver. Recommend MRI of the liver with contrast for further characterization.   08/05/2021 Imaging   CLINICAL DATA:  GI bleed, melena, history of duodenal ulcer, anemia and end-stage renal disease.   EXAM: CT ANGIOGRAPHY ABDOMEN AND PELVIS WITH CONTRAST AND WITHOUT CONTRAST  IMPRESSION: 1. No active bleeding is identified into the gastrointestinal tract during arterial and venous phases of CT imaging. 2. Irregular outpouching along the medial aspect of the proximal duodenum near the expected position of the duodenal bulb or post bulbar duodenum. This is highly suspicious for a focal duodenal ulcer. There is some adjacent edema in the retroperitoneal fat. This may be the source of GI bleeding clinically. 3. Detection of a rounded 4.4 cm central hepatic mass near the juncture of segments IVb and VIII as well as a possible adjacent 2.2 cm mass in segment VIII. Main differential considerations are some type of well differentiated carcinoma such as hepatocellular carcinoma or neuroendocrine carcinoma. Metastatic disease or cholangiocarcinoma is not excluded. Further evaluation with MRI of the abdomen with and without contrast is warranted.   08/06/2021 Initial Diagnosis   Hepatocellular carcinoma ([place])   08/07/2021 Imaging   CLINICAL DATA:  Liver mass, history of hepatitis-C   EXAM: MRI ABDOMEN WITHOUT AND WITH CONTRAST (INCLUDING  MRCP)  IMPRESSION: 1. Hepatic cirrhosis. 5 cm mass centered in segment 4A is LI-RADS 5, consistent with hepatocellular carcinoma. 2. Adjacent 2 cm mass in the central right hepatic lobe as described, LI-RADS 3. Consider follow-up in 3-6 months. 3. Mild splenomegaly likely secondary to portal hypertension. 4. Trace ascites. 5. Small bilateral pleural effusions. Abdominal wall subcutaneous edema noted.   08/07/2021 Cancer Staging   Staging form: Liver, AJCC 8th Edition - Clinical stage from 08/07/2021: Stage II (cT2, cN0, cM0) - Signed by Truitt Merle, MD on 08/30/2021 Stage prefix: Initial diagnosis      REVIEW OF SYSTEMS:   Review of Systems  Constitutional:  Positive for malaise/fatigue. Negative for chills and fever.  HENT: Negative.    Respiratory:  Positive for shortness of breath. Negative for cough.   Cardiovascular: Negative.   Gastrointestinal:  Negative for abdominal pain, nausea and vomiting.  Neurological: Negative.     I have reviewed the past medical history, past surgical history, social history and family history with the patient and they are unchanged from previous note.   PHYSICAL EXAMINATION: ECOG PERFORMANCE STATUS: 2 - Symptomatic, <50% confined to bed  Vitals:   10/08/21 0934 10/08/21 1241  BP: (!) 150/87 (!) 167/95  Pulse: 84 82  Resp:  18  Temp: 98.1 F (36.7 C) 98.5 F (36.9 C)  SpO2: 97% 99%   Filed Weights   10/06/21 0738 10/07/21 0917 10/07/21 1304  Weight: 108.8 kg 110.2 kg 108.5 kg    Intake/Output from previous day: 06/12 0701 - 06/13 0700 In: 1382.3 [P.O.:580; I.V.:802.3] Out: 201.7 [Urine:200]  Physical Exam Vitals reviewed.  Constitutional:      General: He is not in acute distress.  HENT:     Head: Normocephalic.  Eyes:     General: No scleral icterus.    Conjunctiva/sclera: Conjunctivae normal.  Cardiovascular:     Rate and Rhythm: Normal rate and regular rhythm.  Pulmonary:     Effort: Pulmonary effort is normal. No  respiratory distress.  Skin:    General: Skin is warm and dry.  Neurological:     Mental Status: He is alert and oriented to person, place, and time.    LABORATORY DATA:  I have reviewed the data as listed    Latest Ref Rng & Units 10/08/2021    4:24 AM 10/07/2021    2:44 AM 10/06/2021    7:55 AM  CMP  Glucose 70 - 99 mg/dL 100  95  82   BUN 8 - 23 mg/dL 29  57  49   Creatinine 0.61 - 1.24 mg/dL 5.37  7.86  7.50   Sodium 135 - 145 mmol/L 135  140  140   Potassium 3.5 - 5.1 mmol/L 4.3  5.3  5.0   Chloride 98 - 111 mmol/L 98  99  101   CO2 22 - 32 mmol/L 24  22    Calcium 8.9 - 10.3 mg/dL 8.5  8.8      Lab Results  Component Value Date   WBC 5.0 10/08/2021   HGB 8.1 (L) 10/08/2021   HCT 24.3 (L) 10/08/2021   MCV 97.2 10/08/2021   PLT 147 (L) 10/08/2021   NEUTROABS 3.1 10/07/2021    No results found for: "CEA1", "CEA", "CAN199", "CA125", "PSA1"  DG Abd Portable 1V  Result Date: 10/08/2021 CLINICAL DATA:  Chest pain, foreign body EXAM: PORTABLE ABDOMEN - 1 VIEW COMPARISON:  10/08/2011 FINDINGS: Nonobstructive pattern of bowel gas. Gas present to the distal colon. Capsule endoscope remains in the right hemiabdomen, possibly within distal small bowel or the cecal base. No free air in the abdomen. IMPRESSION: Capsule endoscope remains in the right hemiabdomen, possibly within distal small bowel or the cecal base. No free air in the abdomen. Electronically Signed   By: Delanna Ahmadi M.D.   On: 10/08/2021 12:42   DG Chest Port 1 View  Result Date: 10/08/2021 CLINICAL DATA:  Chest pain. EXAM: PORTABLE CHEST 1 VIEW COMPARISON:  Chest radiograph September 28, 2021. FINDINGS: Patchy airspace opacities throughout both lungs. No visible pleural effusions or pneumothorax. Mild enlargement of the cardiac silhouette. No displaced fracture. Incompletely imaged left axillary vascular stent. IMPRESSION: 1. Patchy airspace opacities throughout both lungs, concerning for multifocal pneumonia. 2. Mild  cardiomegaly. Electronically Signed   By: Margaretha Sheffield M.D.   On: 10/08/2021 12:25   DG Abd Portable 1V  Result Date: 10/07/2021 CLINICAL DATA:  Rectal bleeding EXAM: PORTABLE ABDOMEN - 1 VIEW COMPARISON:  None Available. FINDINGS: In Skopic capsule noted in the right lower abdomen, likely within distal small bowel loops. No bowel obstruction. No organomegaly or free air. No suspicious calcification. Mild cardiomegaly. Perihilar airspace opacities noted which likely reflects edema. IMPRESSION: Endoscopic capsule in the right lower quadrant, likely in distal small bowel. No bowel obstruction. Electronically Signed   By: Rolm Baptise M.D.   On: 10/07/2021 20:20   DG Abd Portable 1V  Result Date: 10/05/2021 CLINICAL DATA:  Bilateral shoulder pain and dark colored stools. EXAM: PORTABLE ABDOMEN - 1 VIEW COMPARISON:  October 22, 2019 FINDINGS: Multiple overlying radiopaque cardiac lead wires are seen. The bowel gas pattern is normal. No radio-opaque calculi or other significant radiographic abnormality are seen.  IMPRESSION: Negative. Electronically Signed   By: Virgina Norfolk M.D.   On: 10/05/2021 15:36   VAS [place] LOWER EXTREMITY VENOUS (DVT)  Result Date: 09/29/2021  Lower Venous DVT Study Patient Name:  TALBERT TREMBATH  Date of Exam:   09/28/2021 Medical Rec #: [id number]           Accession #:    [id number] Date of Birth: 08/28/1960           Patient Gender: M Patient Age:   74 years Exam Location:  [facility] Procedure:      VAS [place] LOWER EXTREMITY VENOUS (DVT) Referring Phys: Anderson Malta YATES --------------------------------------------------------------------------------  Indications: Left lower extremity swelling.  Risk Factors: History of left BKA. Limitations: Patient unable to tolerate compression maneuvers. Comparison Study: 04/14/2021- negative left lower extremity venous duplex Performing Technologist: Sharion Dove RVS  Examination Guidelines: A complete evaluation includes B-mode  imaging, spectral Doppler, color Doppler, and power Doppler as needed of all accessible portions of each vessel. Bilateral testing is considered an integral part of a complete examination. Limited examinations for reoccurring indications may be performed as noted. The reflux portion of the exam is performed with the patient in reverse Trendelenburg.  +-----+---------------+---------+-----------+----------+--------------+ RIGHTCompressibilityPhasicitySpontaneityPropertiesThrombus Aging +-----+---------------+---------+-----------+----------+--------------+ CFV  Full           Yes      Yes                                 +-----+---------------+---------+-----------+----------+--------------+   +---------+---------------+---------+-----------+----------+--------------+ LEFT     CompressibilityPhasicitySpontaneityPropertiesThrombus Aging +---------+---------------+---------+-----------+----------+--------------+ CFV                     Yes      Yes        patent                   +---------+---------------+---------+-----------+----------+--------------+ SFJ                     Yes      Yes        patent                   +---------+---------------+---------+-----------+----------+--------------+ FV Prox                 Yes      Yes        patent                   +---------+---------------+---------+-----------+----------+--------------+ FV Mid                  Yes      Yes        patent                   +---------+---------------+---------+-----------+----------+--------------+ FV Distal               Yes      Yes        patent                   +---------+---------------+---------+-----------+----------+--------------+ PFV                     Yes      Yes        patent                   +---------+---------------+---------+-----------+----------+--------------+ POP  Yes      Yes        patent                    +---------+---------------+---------+-----------+----------+--------------+    Summary: RIGHT: - No evidence of common femoral vein obstruction.  LEFT: - No cystic structure found in the popliteal fossa. - No evidence of deep vein thrombosis by color and pulsed wave Doppler involving left lower extremity veins.  *See table(s) above for measurements and observations. Electronically signed by Deitra Mayo MD on 09/29/2021 at 6:31:07 AM.    Final    CT ANGIO AO+BIFEM W & OR WO CONTRAST  Result Date: 09/28/2021 CLINICAL DATA:  Peripheral arterial disease, asymptomatic. EXAM: CT ANGIOGRAPHY OF ABDOMINAL AORTA WITH ILIOFEMORAL RUNOFF TECHNIQUE: Multidetector CT imaging of the abdomen, pelvis and lower extremities was performed using the standard protocol during bolus administration of intravenous contrast. Multiplanar CT image reconstructions and MIPs were obtained to evaluate the vascular anatomy. RADIATION DOSE REDUCTION: This exam was performed according to the departmental dose-optimization program which includes automated exposure control, adjustment of the mA and/or kV according to patient size and/or use of iterative reconstruction technique. CONTRAST:  18m OMNIPAQUE IOHEXOL 350 MG/ML SOLN COMPARISON:  CTA runoff 07/03/2021 FINDINGS: VASCULAR Aorta: Diffuse atherosclerotic calcifications in the abdominal aorta without aneurysm or significant stenosis. Celiac: Patent without evidence of aneurysm, dissection, vasculitis or significant stenosis. SMA: Patent without evidence of aneurysm, dissection, vasculitis or significant stenosis. Renals: Both renal arteries are patent without evidence of aneurysm, dissection, vasculitis, fibromuscular dysplasia or significant stenosis. IMA: Patent RIGHT Lower Extremity Inflow: Atherosclerotic calcifications involving the right iliac arteries without significant stenosis. Right common, right internal and right external iliac artery are patent. Outflow: Circumferential  calcifications in the right common femoral artery without significant stenosis. Right SFA is diffusely calcified and there appears to be a patent stent in the mid right SFA. Right profunda femoral arteries are heavily calcified but patent. Diffuse atherosclerotic calcifications in the popliteal artery with at least 2 focal areas of stenosis from calcified plaque. Most severe stenosis is the level of the knee joint and similar to the previous examination. Runoff: Right runoff vessels are heavily calcified and difficult to evaluate. Proximal runoff vessels appear to be patent but limited evaluation of the distal runoff arteries. LEFT Lower Extremity Inflow: Left iliac arteries are heavily calcified without significant stenosis. Left common, internal and external iliac arteries are patent. Outflow: Circumferential calcifications in left common femoral artery without significant stenosis. Left profunda femoral arteries are patent. Diffuse plaque in the left SFA with multifocal stenosis. Again noted is high-grade stenosis in the mid left SFA on sequence 5 image 213. High-grade stenosis in the proximal popliteal artery is again noted. Runoff: Below the knee amputation. Limited evaluation of the proximal runoff vessels due to heavily calcified vessels. Veins: No obvious venous abnormality within the limitations of this arterial phase study. Review of the MIP images confirms the above findings. NON-VASCULAR Lower chest: Airspace disease in the left upper lobe, right middle lobe and some patchy densities in bilateral lower lobes. Lung findings are most compatible with multifocal pneumonia. No significant pleural effusions. Hepatobiliary: Slightly nodular contour of the liver is compatible with underlying cirrhosis. Patient has known lesions in the central aspect of the liver that are poorly characterized on this examination. Limited evaluation of the portal venous system within the liver. Small calcified gallstone without  gallbladder distension. Pancreas: Again noted is subtle low-density between the pancreatic head and the duodenum  which could represent edema. Otherwise, normal appearance of the pancreas. No pancreatic duct dilatation. Spleen: Normal in size without focal abnormality. Adrenals/Urinary Tract: Normal adrenal glands. Both kidneys are atrophic and compatible with history of end-stage renal disease. Small nonobstructive calculus in the left kidney lower pole. No suspicious renal lesions. No hydronephrosis. Normal appearance of the urinary bladder with mild distension. Stomach/Bowel: No significant bowel dilatation. Mild stranding in the upper abdomen in the gastrohepatic ligament and mild stranding in the porta hepatis. No discrete or focal bowel inflammation. Normal appearance of the stomach. Lymphatic: No significant lymph node enlargement in the abdomen or pelvis. Reproductive: Prostate is unremarkable. Other: No significant ascites. Some stranding in the upper abdomen as described. Negative for free air. Musculoskeletal: Extensive subcutaneous edema involving the left lower extremity at the stump and in the lower thigh and knee. Small left knee joint effusion. There is increased lucency and bone loss involving the left patella compared to the prior examinations. Again noted is evidence for slightly comminuted patellar fracture and this could be pathologic. Small right knee joint effusion. Sclerotic densities in the distal tibia probably represent small bone infarct. Subcutaneous edema in the right lower leg. IMPRESSION: VASCULAR 1. Diffuse atherosclerotic disease throughout the abdomen, pelvis and bilateral lower extremities. 2. No significant inflow disease. 3. Bilateral outflow disease. Multifocal stenosis in the right popliteal artery. Multifocal stenosis involving the left runoff vessels as described. 4. Limited evaluation of the runoff vessels due to heavily calcified vessels. 5. Main visceral arteries are  patent. NON-VASCULAR 1. Multifocal pneumonia. 2. Bony changes involving the left patella with areas of bone loss and fracture. There is extensive soft tissue swelling in the left lower leg at the knee and stump region. Findings are concerning for osteomyelitis involving the left patella. 3. Cirrhosis. Known hepatic lesions are poorly characterized on this examination. 4. Increased stranding in the upper abdomen and porta hepatis region. Findings could be related to cirrhosis. History of duodenal bleeding treated with argon plasma coagulation. These areas of stranding could be related to prior bleeding and treatment. Difficult to exclude acute on chronic disease in this area. No evidence for a bowel perforation. 5. Diffuse subcutaneous edema in lower extremities particularly in the left knee and left stump region. 6. Nonobstructive left renal calculus. These results will be called to the ordering clinician or representative by the Radiologist Assistant, and communication documented in the PACS or [employer]. Electronically Signed   By: Markus Daft M.D.   On: 09/28/2021 12:34   DG Chest Port 1 View  Result Date: 09/28/2021 CLINICAL DATA:  61 year old male with history of weakness, shoulder pain and dark stools since yesterday. EXAM: PORTABLE CHEST 1 VIEW COMPARISON:  Chest x-ray 09/15/2021. FINDINGS: Lung volumes are low. Areas of interstitial prominence and peribronchial cuffing are noted in the lungs bilaterally, most severe throughout the left mid to lower lung. No consolidative airspace disease. No pleural effusions. No pneumothorax. No pulmonary nodule or mass noted. Pulmonary vasculature is normal. Heart size is mildly enlarged (unchanged). Upper mediastinal contours are within normal limits. Atherosclerotic calcifications are noted in the thoracic aorta. IMPRESSION: 1. Findings are concerning for acute bronchitis, potentially with developing bronchopneumonia in the left lower lobe. 2. Aortic  atherosclerosis. Electronically Signed   By: Vinnie Langton M.D.   On: 09/28/2021 07:59   DG Shoulder Right  Result Date: 09/25/2021 CLINICAL DATA:  Chronic shoulder pain over the last several months. EXAM: RIGHT SHOULDER - 2+ VIEW COMPARISON:  10/13/2018.  MRI 09/17/2021.  FINDINGS: Bone humeral joint appears normal. Humeral acromial distance within normal limits. 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 No acute radiographic finding. IMPRESSION: No acute radiographic finding. No evidence of fracture or significant degenerative change by radiography. Chronic proximal humeral medullary bone infarction. Large rotator cuff tear known to be present by recent MRI. Electronically Signed   By: Nelson Chimes M.D.   On: 09/25/2021 10:32   MR SHOULDER RIGHT WO CONTRAST  Result Date: 09/17/2021 CLINICAL DATA:  Chronic shoulder pain. Osteoarthritis suspected. Rotator cuff disorder suspected. Evaluate for metastases. EXAM: MRI OF THE RIGHT SHOULDER WITHOUT CONTRAST TECHNIQUE: Multiplanar, multisequence MR imaging of the shoulder was performed. No intravenous contrast was administered. COMPARISON:  Right shoulder radiographs 10/13/2018 FINDINGS: Despite efforts by the technologist and patient, motion artifact is present on today's exam and could not be eliminated. This reduces exam sensitivity and specificity. Rotator cuff: There is a massive full-thickness tear of the entire AP dimension of the supraspinatus tendon footprint with at least 3.1 cm tendon retraction to the humeral head apex. Within the limitations of patient motion artifact, there is likely partial-thickness anterior infraspinatus tendon tearing. Punctate fluid bright signal at the posterior infraspinatus musculotendinous junction and proximal tendon footprint, a small partial-thickness tear (sagittal series 7 images 15 through 19 and coronal series 5 image 7). Diffuse intermediate T2 signal suggesting moderate subscapularis tendinosis.  The teres minor appears intact. Muscles: Within the limitations of motion artifact there appears to be moderate supraspinatus and anterior infraspinatus muscle atrophy and fatty infiltration. Biceps long head: The intra-articular long head of the biceps tendon is not well visualized. The tendon is seen within the distal aspect of the bicipital groove (axial images 21-23) but not the proximal aspect of the bicipital groove. Findings are suspicious for proximal tendon rupture with distal tendon retraction. Acromioclavicular Joint: There are mild-to-moderate degenerative changes of the acromioclavicular joint including joint space narrowing, clavicular head subchondral marrow edema, and peripheral osteophytosis. Type II acromion. Glenohumeral Joint: Moderate glenoid and humeral head cartilage thinning. Moderate anterior inferior greater than posteroinferior glenoid subchondral marrow edema and cystic change. Likely mild chronic superomedial humeral head cortical flattening/remodeling degenerative change. Labrum: Motion artifact limits evaluation. Likely attenuation and degenerative fraying of the superior glenoid labrum. Bones: Mild marrow edema within the greater tuberosity deep to the supraspinatus tendon insertion. There is tubular and serpiginous decreased T1 and heterogeneous T2 signal seen within the proximal diaphysis of the humerus. This corresponds to serpiginous sclerotic and lucent signal on prior 10/13/2018 radiographs. This appears to represent a chronic bone infarct. No cortical destruction or extraosseous soft tissue mass. Other: None. IMPRESSION: 1. Unfortunately, moderate to high-grade patient motion artifact technically limits this study. 2. Massive full-thickness tear of the entire AP dimension of the supraspinatus tendon footprint. Moderate supraspinatus muscle atrophy. 3. Likely anterior infraspinatus partial-thickness tendon tearing. Mild partial-thickness tear within the posterior infraspinatus  musculotendinous junction and proximal tendon. 4. Moderate subscapularis tendinosis. 5. Mild-to-moderate degenerative changes of the acromioclavicular and glenohumeral joints. 6. 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 Electronically Signed   By: Yvonne Kendall M.D.   On: 09/17/2021 12:36   MR SHOULDER LEFT WO CONTRAST  Result Date: 09/17/2021 CLINICAL DATA:  Shoulder pain. Rotator cuff disorder suspected. Evaluate for metastases. EXAM: MRI OF THE LEFT SHOULDER WITHOUT CONTRAST TECHNIQUE: Multiplanar, multisequence MR imaging of the shoulder was performed. No intravenous contrast was administered. COMPARISON:  Left shoulder radiographs 09/16/2021 FINDINGS: Despite efforts by the technologist and patient, motion artifact is present  on today's exam and could not be eliminated. This reduces exam sensitivity and specificity. Rotator cuff: There is horizontal linear fluid bright signal indicating a high-grade partial-thickness bursal sided tear of the supraspinatus tendon footprint measuring up to 11 mm in transverse dimension (coronal series 4 images thirteen through 15) and 16 mm in AP dimension (sagittal series 6, images 18 and 19). Only minimal approximate 5 mm tendon retraction of the bursal sided fibers. The infraspinatus is intact. Minimal superior subscapularis intermediate T2 signal tendinosis. The teres minor is intact. Muscles: No rotator cuff muscle atrophy, fatty infiltration, or edema. Biceps long head: The intra-articular long head of the biceps tendon is intact. Acromioclavicular Joint: There are mild-to-moderate degenerative changes of the acromioclavicular joint including joint space narrowing, clavicular head subchondral marrow edema, and peripheral osteophytosis. Type II acromion. Mild subacromial/subdeltoid bursitis. Glenohumeral Joint: Moderate thinning of the glenoid and humeral head cartilage. Labrum: Grossly intact, but evaluation is limited by lack of intraarticular fluid.  Bones:  No acute fracture. Other: Prominent anterior shoulder blood vessels. Note is made of a stent overlying the left axilla on prior radiographs. IMPRESSION: 1. Horizontal linear high-grade partial-thickness bursal sided tear of the anterior supraspinatus tendon footprint measuring up to 16 mm in AP dimension. 2. Mild-to-moderate degenerative changes of the acromioclavicular joint. 3. Moderate thinning of the glenoid and humeral head cartilage. Electronically Signed   By: Yvonne Kendall M.D.   On: 09/17/2021 12:23   DG Shoulder Left  Result Date: 09/16/2021 CLINICAL DATA:  Left shoulder pain no known trauma. EXAM: LEFT SHOULDER - 2+ VIEW COMPARISON:  None Available. FINDINGS: There is no evidence of fracture or dislocation. Mild degenerative change of the glenohumeral and acromioclavicular joints. Mineralization of the supraspinatus tendon. Vascular stent projects over the left axilla. Vascular calcifications. IMPRESSION: 1. No acute fracture or dislocation. 2. Mild degenerative changes of the glenohumeral and acromioclavicular joints. 3. Calcific tendinosis of the supraspinatus tendon. Electronically Signed   By: Dahlia Bailiff M.D.   On: 09/16/2021 16:21   DG CHEST PORT 1 VIEW  Result Date: 09/15/2021 CLINICAL DATA:  Shortness of breath EXAM: PORTABLE CHEST 1 VIEW COMPARISON:  09/14/2021 FINDINGS: Stable cardiomegaly. Aortic atherosclerosis. Mild pulmonary vascular congestion. Mildly increased perihilar and bibasilar interstitial markings bilaterally. No large pleural fluid collection. No pneumothorax. IMPRESSION: Findings suggestive of CHF with mild interstitial edema. Electronically Signed   By: Davina Poke D.O.   On: 09/15/2021 18:46   DG Pelvis Portable  Result Date: 09/15/2021 CLINICAL DATA:  Fall with hip pain. EXAM: PORTABLE PELVIS 1-2 VIEWS COMPARISON:  Pelvis radiograph 08/11/2021 FINDINGS: No evidence of acute fracture of the pelvis or hips. Femoral heads remain seated. The bones are  subjectively under mineralized. Grossly intact pubic rami. Pubic symphysis and sacroiliac joints are congruent. Advanced vascular calcifications. IMPRESSION: No acute fracture of the pelvis or hips. Electronically Signed   By: Keith Rake M.D.   On: 09/15/2021 00:16   DG Chest Portable 1 View  Result Date: 09/14/2021 CLINICAL DATA:  Shortness of breath EXAM: PORTABLE CHEST 1 VIEW COMPARISON:  08/16/2021 FINDINGS: Transverse diameter of heart is increased. Central pulmonary vessels are prominent. Increased interstitial markings are seen in the parahilar regions and lower lung fields. There is hazy homogeneous opacity in the right parahilar region. Lateral CP angles are indistinct. There is no pneumothorax. There is vascular stent in the left axilla. IMPRESSION: Cardiomegaly. Central pulmonary vessels are prominent suggesting possible mild CHF. Increased interstitial markings are seen in the parahilar regions and lower  lung fields suggesting mild interstitial edema or interstitial pneumonia. There is hazy homogeneous opacity in the right parahilar region which may suggest loculated effusion in the interlobar fissure or subsegmental atelectasis. Electronically Signed   By: Elmer Picker M.D.   On: 09/14/2021 12:52   IR Radiologist Eval & Mgmt  Result Date: 09/12/2021 Please refer to notes tab for details about interventional procedure. (Op Note)    Future Appointments  Date Time Provider [place]  10/10/2021 12:45 PM [facility] LAB [facility] None  10/10/2021  1:20 PM Truitt Merle, MD [facility] None  10/24/2021  7:30 AM [facility]-MDCC ROOM WL-MDCC None  10/24/2021  9:30 AM WL-IR 1 WL-IR [place]  10/24/2021 10:30 AM WL-NM 1 WL-NM [place]  11/07/2021  7:30 AM [facility]-MDCC ROOM WL-MDCC None  11/07/2021  9:30 AM WL-IR 1 WL-IR [place]  11/07/2021 10:30 AM WL-NM 1 WL-NM [place]  11/07/2021 12:00 PM WL-NM 1 WL-NM [place]  11/19/2021  1:00 PM MC-CV HS VASC 3 - EM MC-HCVI VVS   11/19/2021  1:30 PM MC-CV HS VASC 3 - EM MC-HCVI VVS  11/19/2021  2:30 PM [facility]-[place] PA V[facility] VVS  12/04/2021 10:30 AM Warren Lacy, PA-C [facility]  12/31/2021  9:15 AM Lorretta Harp, MD [facility]  04/01/2022  8:00 AM Lorretta Harp, MD [facility]      LOS: 3 days   Addendum  I have seen the patient, examined him. I agree with the assessment and and plan and have edited the notes.   Pt has had recurrent GI bleeding and required frequent hospital admissions lately.  I started him on bevacizumab a month ago for GI bleeding, but he has not been able to follow-up and get treatment in office due to his frequent hospital admissions.  I will resume bevacizumab therapy next week if he is able to come in.  Continue blood transfusion as needed, and IV iron as needed.  We did give him 1 dose Feraheme today.  He has not received treatment for his recently diagnosed [place], but Y90 is scheduled for 7/13. I will reach out to interventional radiology tDr. Serafina Royals regarding his cancer treatment.  I also discussed systemic therapy with Tecentriq and bevacizumab for [facility] treatment, due to his difficulty of f/u and treatment in outpt setting.  All questions were answered.  I will reschedule his appointment with [place] from this week to next week.  Truitt Merle  10/08/2021

## 2021-10-08 NOTE — Progress Notes (Signed)
Inpatient Rehab Admissions Coordinator:   Per updated PT recommendation pt was screened for CIR by Shann Medal, PT, DPT.  At this time pt appears he may be a candidate for CIR program depending on caregiver support.  I will place an order for rehab consult and full assessment per our protocol.   Shann Medal, PT, DPT Admissions Coordinator [phone] 10/08/21  12:33 PM

## 2021-10-08 NOTE — Plan of Care (Signed)
  Problem: Health Behavior/Discharge Planning: Goal: Ability to manage health-related needs will improve Outcome: Progressing   Problem: Clinical Measurements: Goal: Ability to maintain clinical measurements within normal limits will improve Outcome: Progressing Goal: Will remain free from infection Outcome: Progressing Goal: Diagnostic test results will improve Outcome: Progressing Goal: Respiratory complications will improve Outcome: Progressing Goal: Cardiovascular complication will be avoided Outcome: Progressing   Problem: Activity: Goal: Risk for activity intolerance will decrease Outcome: Progressing   Problem: Nutrition: Goal: Adequate nutrition will be maintained Outcome: Progressing   Problem: Coping: Goal: Level of anxiety will decrease Outcome: Progressing   Problem: Elimination: Goal: Will not experience complications related to bowel motility Outcome: Progressing Goal: Will not experience complications related to urinary retention Outcome: Progressing   Problem: Pain Managment: Goal: General experience of comfort will improve Outcome: Progressing   Problem: Safety: Goal: Ability to remain free from injury will improve Outcome: Progressing   Problem: Skin Integrity: Goal: Risk for impaired skin integrity will decrease Outcome: Progressing   Problem: Education: Goal: Ability to identify signs and symptoms of gastrointestinal bleeding will improve Outcome: Progressing   Problem: Bowel/Gastric: Goal: Will show no signs and symptoms of gastrointestinal bleeding Outcome: Progressing   Problem: Fluid Volume: Goal: Will show no signs and symptoms of excessive bleeding Outcome: Progressing   Problem: Clinical Measurements: Goal: Complications related to the disease process, condition or treatment will be avoided or minimized Outcome: Progressing   

## 2021-10-08 NOTE — Care Management Important Message (Signed)
Important Message  Patient Details  Name: Daniel [name] MRN: [id number] Date of Birth: Jul 04, 1960   Medicare Important Message Given:  Yes     [name] 10/08/2021, 3:02 PM

## 2021-10-08 NOTE — Progress Notes (Signed)
PROGRESS NOTE  Daniel [name] ZJQ:[id number] DOB: 1961-01-08   PCP: [name], NP  [facility] is [facility]  Uses wheelchair at baseline.  DOA: 10/05/2021 LOS: 3  Chief complaints Chief Complaint  Patient presents with   Rectal Bleeding   Shoulder Pain     Brief Narrative / Interim history: 61 year old M with PMH of ESRD on HD MWF, IDA, recurrent GIB, duodenal ulcer, gastric ulcer, small bowel AVM, PAD, chronic pain syndrome, DM-2, orthostatic hypotension, EtOH use, tobacco use disorder, anxiety, left BKA, QTc and recent hospitalization from 6/3-6/4 for symptomatic anemia in the setting of recurrent GI bleed and multifocal pneumonia returning with ongoing melena, and admitted for acute on chronic blood loss anemia.  Hgb 7.3 (9.0 two days prior and 11.3 six days prior).  Seems he is on low-dose aspirin.  Also question about compliance with PPI.   Patient was transfused 1 unit.  GI consulted.  He underwent a small bowel endoscopy that showed normal esophagus, mild PHG, few friable gastric polyps at the pylorus (treated with APC), blood in duodenal bulb and first portion of duodenum, a single recently bleeding AVM in the duodenum (treated with APC), a single bleeding AVM in duodenum (treated with APC) and multiple duodenal polyps.  Capsule endoscopy underway to evaluate AVM burden.  GI recommends considering outpatient octreotide therapy.   Patient had an episode of chest tightness and shortness of breath.  BNP elevated to 1700 (higher than baseline).  Portable chest x-ray concerning for bilateral pneumonia.  Pro-Cal elevated to 0.45.  Troponin 19. Nephrology dialyzing off schedule today.  Started ceftriaxone and doxycycline.  CT chest ordered.  Therapy recommended CIR.  Subjective: Seen and examined earlier this morning and earlier this afternoon.  Patient was working with therapy earlier this morning.  He is complaining of left flank and LLQ pain.  Reports having  similar pain before coming to the hospital.  Patient is not able to elaborate.  He is not a great historian.  He denies urinary symptoms.  Objective: Vitals:   10/08/21 1530 10/08/21 1600 10/08/21 1630 10/08/21 1700  BP: (!) 159/66 (!) 154/54 (!) 152/53 (!) 169/60  Pulse: 84 83 80 78  Resp: '18 17 16 16  '$ Temp:      TempSrc:      SpO2: 99% 98% 100% 100%  Weight:      Height:        Examination:  GENERAL: No apparent distress.  Nontoxic. HEENT: MMM.  Vision and hearing grossly intact.  NECK: Supple.  No apparent JVD.  RESP:  No IWOB.  Fair aeration bilaterally. CVS:  RRR. Heart sounds normal.  ABD/GI/GU: BS+. Abd soft.  Tenderness over LLQ.  Seems to have left-sided CVA tenderness as well. MSK/EXT: Left AKA stump with healing wound.  No signs of infection.  Trace RLE edema. SKIN: As above. NEURO: Awake and alert. Oriented fairly but limited insight.  No apparent focal neuro deficit. PSYCH: Calm. Normal affect.   Procedures:  6/11-small bowel endoscopy as above. 6/11-capsule endoscopy  Microbiology summarized: None  Assessment and plan: Principal Problem:   Acute on chronic blood loss anemia Active Problems:   Melena   ESRD (end stage renal disease) on dialysis (HCC)   Hepatocellular carcinoma (HCC)   Essential hypertension   AVM (arteriovenous malformation) of small bowel, acquired with hemorrhage   Prolonged QT interval   Upper GI bleed   Thrombocytopenia (HCC)   AVM (arteriovenous malformation) of duodenum, acquired   Tobacco use disorder  Acute on chronic diastolic CHF (congestive heart failure) (HCC)   Recurrent gastrointestinal hemorrhage   Chronic pain disorder   Rotator cuff syndrome   Epistaxis   Hx of BKA, left (HCC)   Intestinal polyps   Portal hypertensive gastropathy (HCC)   Hyperkalemia   Abdominal pain   CAP (community acquired pneumonia)   LLQ abdominal pain   Physical deconditioning   Cognitive decline  Acute on chronic blood loss anemia  in the setting of recurrent upper GI bleed Duodenal AVM/PHG/gastritis-noted on his small bowel endoscopy again.  Endoscopy as above. Hemodynamically stable.  Patient is on low-dose aspirin for PAD.  Denies other NSAID use.  Not sure if he is compliant with his Protonix.  Supposed to take twice daily.  Transfused 1 unit on 6/10 and another 1 on 6/12.  Anemia panel with iron deficiency. Recent Labs    10/05/21 0729 10/05/21 1602 10/05/21 2043 10/06/21 0447 10/06/21 0755 10/06/21 0935 10/07/21 0244 10/07/21 1442 10/08/21 0424 10/08/21 1201  HGB 7.3* 7.7* 7.3* 7.5* 8.2* 7.6* 7.2* 8.6* 8.1* 8.1*  -Continue holding aspirin -IV Feraheme per oncology. -P.o. Protonix 40 mg twice daily.  Octreotide discontinued. -Monitor CBC daily -Follow capsule endoscopy result.  KUB showed capsule in RLQ. -Capsule endoscopy results pending.  Community-acquired pneumonia: Patient has sudden shortness of breath and chest pain on 6/13.  Has no fever or leukocytosis.  Chest x-ray with patchy airspace opacities throughout both lungs concerning for multifocal pneumonia.  Pro-Cal 0.45.  He also have elevated BNP to 1700 (higher than baseline) which raises concern for CHF exacerbation. -Extra HD by nephrology today -Start ceftriaxone and doxycycline -CT chest without contrast for better understanding  Abdominal pain/tenderness: seems to have LLQ tenderness and left-sided CVA tenderness.  He still makes urine.  He denies UTI symptoms. -CT abdomen and pelvis without contrast -Antibiotics as above.  Acute bilateral shoulder pain: Likely due to chronic rotator cuff tears and degenerative changes of acromioclavicular joint as noted on his MRIs on 09/17/2021.  Pain improved. -Pain control with Tylenol, Percocet and IV Dilaudid   ESRD on HD MWF-reports full session HD on Friday. -Getting extra HD off schedule today. -Nephrology managing   Acute on chronic diastolic CHF: TTE in 07/345 with LVEF of 55 to 60%, no RWMA  and RVSP of 48.4.  Appears euvolemic on exam but acute dyspnea with markedly elevated BNP concerning for CHF exacerbation.  -Extra HD of scheduled today. -Resume home metoprolol and [place]   History of hepatocellular carcinoma -Oncology following.   Left BKA-appears stable. -Continue Ace wrap   Tobacco use disorder: Smokes about half a pack a day.  Encouraged cessation. -Continue nicotine patch  Mild thrombocytopenia: Stable.  Monitor.  Hyperkalemia: Likely due to renal failure.  Resolved. -Per nephrology.  Epistaxis: Prior to arrival.  Resolved.  Physical deconditioning: Patient with left BKA.  Uses wheelchair at baseline.  Seems to stay at the boardinghouse.  Not ideal situation.  Cognition is not great.  Concern about compliance with his medication.  Her recurrent hospitalization. -Therapy recommended CIR.  CIR following.  Obesity:  Body mass index is 32.32 kg/m.  Could be underestimated by BKA       DVT prophylaxis:  SCDs Start: 10/05/21 1229  Code Status: Full code Family Communication: None at bedside. Level of care: Med-Surg Status is: Inpatient Remains inpatient appropriate because: Acute blood loss anemia, community-acquired pneumonia and acute CHF   Final disposition: CIR Consultants:  Gastroenterology Nephrology  Sch Meds:  Scheduled Meds:  sodium chloride   Intravenous Once   atorvastatin  40 mg Oral Daily   Chlorhexidine Gluconate Cloth  6 each Topical Q0600   Chlorhexidine Gluconate Cloth  6 each Topical Q0600   doxycycline  100 mg Oral Q12H   gabapentin  100 mg Oral TID   iohexol       nicotine  14 mg Transdermal Daily   pantoprazole  40 mg Oral BID   Continuous Infusions:  anticoagulant sodium citrate     cefTRIAXone (ROCEPHIN)  IV 2 g (10/08/21 1338)   PRN Meds:.acetaminophen **OR** acetaminophen, alteplase, anticoagulant sodium citrate, camphor-menthol, heparin, HYDROmorphone (DILAUDID) injection, hydrOXYzine, iohexol, lidocaine (PF),  lidocaine-prilocaine, oxyCODONE-acetaminophen, pentafluoroprop-tetrafluoroeth  Antimicrobials: Anti-infectives (From admission, onward)    Start     Dose/Rate Route Frequency Ordered Stop   10/08/21 1400  cefTRIAXone (ROCEPHIN) 2 g in sodium chloride 0.9 % 100 mL IVPB        2 g 200 mL/hr over 30 Minutes Intravenous Every 24 hours 10/08/21 1310 10/13/21 1359   10/08/21 1400  doxycycline (VIBRA-TABS) tablet 100 mg        100 mg Oral Every 12 hours 10/08/21 1310 10/13/21 0959        I have personally reviewed the following labs and images: CBC: Recent Labs  Lab 10/03/21 0900 10/05/21 0729 10/05/21 1602 10/06/21 0447 10/06/21 0755 10/06/21 0935 10/07/21 0244 10/07/21 1442 10/08/21 0424 10/08/21 1201  WBC 5.3 4.8  --  5.2  --   --  5.5  --  5.1 5.0  NEUTROABS 2.9  --   --   --   --   --  3.1  --   --   --   HGB 9.0* 7.3*   < > 7.5*   < > 7.6* 7.2* 8.6* 8.1* 8.1*  HCT 26.7* 21.8*   < > 22.5*   < > 22.7* 21.7* 25.0* 23.9* 24.3*  MCV 97.4 98.2  --  96.6  --   --  99.5  --  95.6 97.2  PLT 142* 146*  --  141*  --   --  155  --  143* 147*   < > = values in this interval not displayed.   BMP &GFR Recent Labs  Lab 10/03/21 0900 10/05/21 0729 10/06/21 0447 10/06/21 0755 10/07/21 0244 10/08/21 0424  NA 137 143 139 140 140 135  K 4.6 4.4 5.8* 5.0 5.3* 4.3  CL 96* 96* 96* 101 99 98  CO2 '22 28 24  '$ --  22 24  GLUCOSE 97 96 81 82 95 100*  BUN 46* 41* 52* 49* 57* 29*  CREATININE 8.09* 5.96* 7.00* 7.50* 7.86* 5.37*  CALCIUM 9.7 9.0 9.0  --  8.8* 8.5*  MG  --   --   --   --   --  1.6*  PHOS  --   --   --   --  5.2* 4.5   Estimated Creatinine Clearance: 17.8 mL/min (A) (by C-G formula based on SCr of 5.37 mg/dL (H)). Liver & Pancreas: Recent Labs  Lab 10/03/21 0900 10/05/21 0729 10/06/21 0447 10/07/21 0244 10/08/21 0424  AST 38 46* 48*  --   --   ALT '11 16 15  '$ --   --   ALKPHOS 144* 124 110  --   --   BILITOT 0.7 0.8 0.8  --   --   PROT 8.3* 7.5 7.1  --   --   ALBUMIN  3.2* 2.3* 2.3* 2.4* 2.5*   No  results for input(s): "LIPASE", "AMYLASE" in the last 168 hours. No results for input(s): "AMMONIA" in the last 168 hours. Diabetic: No results for input(s): "HGBA1C" in the last 72 hours. Recent Labs  Lab 10/06/21 0859 10/07/21 0829  GLUCAP 78 103*   Cardiac Enzymes: No results for input(s): "CKTOTAL", "CKMB", "CKMBINDEX", "TROPONINI" in the last 168 hours. No results for input(s): "PROBNP" in the last 8760 hours. Coagulation Profile: Recent Labs  Lab 10/05/21 0729  INR 1.1   Thyroid Function Tests: No results for input(s): "TSH", "T4TOTAL", "FREET4", "T3FREE", "THYROIDAB" in the last 72 hours. Lipid Profile: No results for input(s): "CHOL", "HDL", "LDLCALC", "TRIG", "CHOLHDL", "LDLDIRECT" in the last 72 hours. Anemia Panel: Recent Labs    10/07/21 0244  FERRITIN 159  TIBC 321  IRON 90   Urine analysis:    Component Value Date/Time   COLORURINE YELLOW 07/31/2019 2128   APPEARANCEUR CLEAR 07/31/2019 2128   LABSPEC 1.009 07/31/2019 2128   PHURINE 5.0 07/31/2019 2128   GLUCOSEU NEGATIVE 07/31/2019 2128   HGBUR SMALL (A) 07/31/2019 2128   BILIRUBINUR NEGATIVE 07/31/2019 2128   KETONESUR NEGATIVE 07/31/2019 2128   PROTEINUR 100 (A) 09/03/2021 1150   UROBILINOGEN 0.2 12/31/2009 0746   NITRITE NEGATIVE 07/31/2019 2128   LEUKOCYTESUR NEGATIVE 07/31/2019 2128   Sepsis Labs: Invalid input(s): "PROCALCITONIN", "LACTICIDVEN"  Microbiology: No results found for this or any previous visit (from the past 240 hour(s)).  Radiology Studies: DG Abd Portable 1V  Result Date: 10/08/2021 CLINICAL DATA:  Chest pain, foreign body EXAM: PORTABLE ABDOMEN - 1 VIEW COMPARISON:  10/08/2011 FINDINGS: Nonobstructive pattern of bowel gas. Gas present to the distal colon. Capsule endoscope remains in the right hemiabdomen, possibly within distal small bowel or the cecal base. No free air in the abdomen. IMPRESSION: Capsule endoscope remains in the right  hemiabdomen, possibly within distal small bowel or the cecal base. No free air in the abdomen. Electronically Signed   By: Delanna Ahmadi M.D.   On: 10/08/2021 12:42   DG Chest Port 1 View  Result Date: 10/08/2021 CLINICAL DATA:  Chest pain. EXAM: PORTABLE CHEST 1 VIEW COMPARISON:  Chest radiograph September 28, 2021. FINDINGS: Patchy airspace opacities throughout both lungs. No visible pleural effusions or pneumothorax. Mild enlargement of the cardiac silhouette. No displaced fracture. Incompletely imaged left axillary vascular stent. IMPRESSION: 1. Patchy airspace opacities throughout both lungs, concerning for multifocal pneumonia. 2. Mild cardiomegaly. Electronically Signed   By: Margaretha Sheffield M.D.   On: 10/08/2021 12:25   DG Abd Portable 1V  Result Date: 10/07/2021 CLINICAL DATA:  Rectal bleeding EXAM: PORTABLE ABDOMEN - 1 VIEW COMPARISON:  None Available. FINDINGS: In Skopic capsule noted in the right lower abdomen, likely within distal small bowel loops. No bowel obstruction. No organomegaly or free air. No suspicious calcification. Mild cardiomegaly. Perihilar airspace opacities noted which likely reflects edema. IMPRESSION: Endoscopic capsule in the right lower quadrant, likely in distal small bowel. No bowel obstruction. Electronically Signed   By: Rolm Baptise M.D.   On: 10/07/2021 20:20      Aiyana Stegmann T. [place]  If 7PM-7AM, please contact night-coverage [email or web address] 10/08/2021, 5:16 PM

## 2021-10-08 NOTE — Progress Notes (Addendum)
   Patient Name: Daniel [name] Date of Encounter: 10/08/2021, 12:00 PM    Subjective  Still no BM - says none since prep for capsule  Not in distress from this   Objective  BP (!) 150/87 (BP Location: Right Arm)   Pulse 84   Temp 98.1 F (36.7 C)   Resp 16   Ht '5\' 11"'$  (1.803 m)   Wt 108.5 kg   SpO2 97%   BMI 33.36 kg/m  Tender left lateral ribs, abdomen benign     Latest Ref Rng & Units 10/08/2021    4:24 AM 10/07/2021    2:42 PM 10/07/2021    2:44 AM  CBC  WBC 4.0 - 10.5 K/uL 5.1   5.5   Hemoglobin 13.0 - 17.0 g/dL 8.1  8.6  7.2   Hematocrit 39.0 - 52.0 % 23.9  25.0  21.7   Platelets 150 - 400 K/uL 143   155      Assessment and Plan  Melena, heme positive stools, ongoing bleeding for a couple of months.  Previous studies including but not limited to SBE 3/10, 4/11, 4/29, 5/21.  VCE 4/30.  Flexible sigmoidoscopy 3/10. 10/06/2020 SBE.  Mild portal hypertensive gastropathy, friable gastric polyps at pylorus treated with APC.  Blood at the duodenal bulb and fourth duodenum.  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  Another nonbleeding duodenal AVM treated with APC.  Multiple duodenal polyps, not biopsied.  Previously placed tattoo site seen at jejunum.  VCE capsule placed - not a compete study and inconclusive - pending additional review. No new lesions seen, though.   ESRD.  Dialysis MWF.   Chronic anemia. Prior PRBC transfusion.  2 PRBCs this admission.  Hgb 7.3..  7.2 this morning, 2nd PRBC ... 8.6 at recheck this afternoon.   Chronic pain syndrome. Left rib pain now  Also cirrhosis and [place]     He is stable  I do not think we will intervene again this admit  Octreotide very difficult to obtain for AVM's - will dc now and observe  Yesterday KUB suggested capsule in distal small bowel - recheck  He has upcoming [employer] ablation later this month  Will f/u after I get additional input on the capsule   Gatha Mayer, MD, [facility]  Gastroenterology See [name] on call - gastroenterology for best contact person 10/08/2021 12:00 PM

## 2021-10-08 NOTE — TOC Initial Note (Signed)
Transition of Care [facility]) - Initial/Assessment Note    Patient Details  Name: Daniel [name] MRN: [id number] Date of Birth: 18-Jul-1960  Transition of Care [facility]) CM/SW Contact:    Tom-Johnson, Renea Ee, RN Phone Number: 10/08/2021, 2:53 PM  Clinical Narrative:                  CM spoke with patient abut needs for post hospital transition. Admitted for Acute on Chronic Anemia/Epistaxis. Hgb on admission was 7.7 and has received 2 units of PRBC, today reading at 8.1.  Has scheduled Capsule endoscopy today. Patient states he resides at a [employer]. Has two living children but are not supportive. Currently on disability. Has scheduled dialysis on MWF and states he drives self to and from dialysis and other appointments. Has a cane, walker and wheelchair at home.  PCP is Oletta Lamas, Mil[place] Cage, NP and uses [facility] on [address].  PT recommending CIR and full assessment will be done depending on patient's support at home after discharge. CM will continue to follow with needs.        Expected Discharge Plan: OP Rehab Barriers to Discharge: Continued Medical Work up   Patient Goals and CMS Choice Patient states their goals for this hospitalization and ongoing recovery are:: To return home CMS Medicare.gov Compare Post Acute Care list provided to:: Patient Choice offered to / list presented to : Patient  Expected Discharge Plan and Services Expected Discharge Plan: OP Rehab   Discharge Planning Services: CM Consult Post Acute Care Choice: IP Rehab Living arrangements for the past 2 months: [place]                 DME Arranged: N/A DME Agency: NA         [place] Agency: NA        Prior Living Arrangements/Services Living arrangements for the past 2 months: [place] with:: Self Patient language and need for interpreter reviewed:: Yes Do you feel safe going back to the place where you live?: Yes      Need for Family Participation in  Patient Care: Yes (Comment) Care giver support system in place?: Yes (comment) Current home services: DME (Wheelchair ,walker, cane) Criminal Activity/Legal Involvement Pertinent to Current Situation/Hospitalization: No - Comment as needed  Activities of Daily Living Home Assistive Devices/Equipment: Environmental consultant (specify type), Wheelchair ADL Screening (condition at time of admission) Patient's cognitive ability adequate to safely complete daily activities?: No Is the patient deaf or have difficulty hearing?: No Does the patient have difficulty seeing, even when wearing glasses/contacts?: No Does the patient have difficulty concentrating, remembering, or making decisions?: No Patient able to express need for assistance with ADLs?: Yes Does the patient have difficulty dressing or bathing?: No Independently performs ADLs?: No Does the patient have difficulty walking or climbing stairs?: No Weakness of Legs: None Weakness of Arms/Hands: None  Permission Sought/Granted Permission sought to share information with : Case Manager, Customer service manager, Family Supports Permission granted to share information with : Yes, Verbal Permission Granted              Emotional Assessment Appearance:: Appears stated age Attitude/Demeanor/Rapport: Engaged, Gracious Affect (typically observed): Accepting, Appropriate, Calm, Hopeful Orientation: : Oriented to Self, Oriented to Place, Oriented to  Time, Oriented to Situation Alcohol / Substance Use: Not Applicable Psych Involvement: No (comment)  Admission diagnosis:  Rectal bleeding [K62.5] Symptomatic anemia [D64.9] Acute on chronic blood loss anemia [D62] Patient Active Problem List  Diagnosis Date Noted   Hyperkalemia 10/07/2021   Abdominal pain 10/07/2021   Intestinal polyps 10/06/2021   Portal hypertensive gastropathy ([place]) 10/06/2021   Acute on chronic blood loss anemia 10/05/2021   Rotator cuff syndrome 10/05/2021   Epistaxis  10/05/2021   Hx of BKA, left ([place]) 10/05/2021   Recurrent gastrointestinal hemorrhage 09/28/2021   Edema of amputation stump of left lower extremity ([place]) 09/28/2021   Chronic pain disorder 09/28/2021   GI bleed 08/23/2021   Amputation stump infection ([place])    Sepsis ([place]) 08/16/2021   Right foot pain 08/07/2021   Lung mass 08/06/2021   Hepatocellular carcinoma ([place]) 08/06/2021   Melena    Pressure injury of skin 07/05/2021   Right leg pain 07/04/2021   GI bleeding 07/04/2021   Acute lower GI bleeding 07/03/2021   Chronic diastolic CHF (congestive heart failure) ([place]) 06/14/2021   GERD (gastroesophageal reflux disease) 06/14/2021   Chest pain 06/13/2021   Diabetic foot infection ([place]) 04/14/2021   PAD (peripheral artery disease) ([place]) 04/08/2021   Gastritis and gastroduodenitis    ESRD (end stage renal disease) on dialysis ([place]) 03/25/2021   Diabetes mellitus type 2, controlled, with complications ([place]) 72/53/6644   QT prolongation 03/25/2021   Alcohol abuse with intoxication ([place]) 03/25/2021   Cocaine abuse ([place]) 03/25/2021   Tobacco use disorder 03/25/2021   Class 2 obesity due to excess calories with body mass index (BMI) of 39.0 to 39.9 in adult 03/25/2021   AVM (arteriovenous malformation) of duodenum, acquired    Peptic ulcer disease    Upper GI bleed 03/24/2021   Chalazion of right upper eyelid 03/24/2021   Thrombocytopenia ([place]) 03/24/2021   Visual hallucination 03/24/2021   Bilateral leg edema 03/24/2021   Benign neoplasm of duodenum, jejunum, and ileum    Anemia due to chronic kidney disease 02/23/2021   Prolonged QT interval 02/23/2021   Rotator cuff tear arthropathy of right shoulder 01/23/2021   Alcohol dependence ([place]) 01/13/2021   Gastric ulcer with hemorrhage 01/13/2021   Generalized weakness    Transaminitis 01/10/2021   History of alcohol abuse 10/27/2020   History of cocaine abuse ([place]) 10/27/2020   Nicotine dependence, cigarettes, uncomplicated  03/47/4259   Acute GI bleeding 08/10/2020   Duodenal ulcer with hemorrhage    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   AVM (arteriovenous malformation) of small bowel, acquired with hemorrhage    Symptomatic anemia 07/31/2019   Chronic hepatitis C without hepatic coma ([place]) 07/11/2019   Iron deficiency anemia 03/30/2019   Alcohol use disorder 03/30/2019   B12 deficiency 03/30/2019   Orthostatic hypotension 01/22/2019   Anemia    Essential hypertension 06/29/2016   PCP:  [name], NP Pharmacy:   [facility], [address] [place] [address] Phone: [phone] Fax: [phone]     Social Determinants of Health (SDOH) Interventions    Readmission Risk Interventions    02/27/2021   12:51 PM 07/07/2019    4:03 PM  Readmission Risk Prevention Plan  Transportation Screening Complete Complete  PCP or Specialist Appt within 3-5 Days  Complete  HRI or [place]  Complete  Social Work Consult for [place] Planning/Counseling  Complete  Palliative Care Screening  Not Applicable  Medication Review Press photographer) Complete Complete  PCP or Specialist appointment within 3-5 days of discharge Complete   HRI or Home Care Consult Complete   SW Recovery Care/Counseling Consult Complete  Palliative Care Screening Not Applicable   Skilled Nursing Facility Not Applicable

## 2021-10-09 ENCOUNTER — Encounter (HOSPITAL_COMMUNITY): Payer: Self-pay | Admitting: Gastroenterology

## 2021-10-09 ENCOUNTER — Inpatient Hospital Stay (HOSPITAL_COMMUNITY): Payer: Medicare Other

## 2021-10-09 DIAGNOSIS — K921 Melena: Secondary | ICD-10-CM | POA: Diagnosis not present

## 2021-10-09 DIAGNOSIS — N186 End stage renal disease: Secondary | ICD-10-CM | POA: Diagnosis not present

## 2021-10-09 DIAGNOSIS — C220 Liver cell carcinoma: Secondary | ICD-10-CM | POA: Diagnosis not present

## 2021-10-09 DIAGNOSIS — R109 Unspecified abdominal pain: Secondary | ICD-10-CM

## 2021-10-09 DIAGNOSIS — D62 Acute posthemorrhagic anemia: Secondary | ICD-10-CM | POA: Diagnosis not present

## 2021-10-09 LAB — RENAL FUNCTION PANEL
Albumin: 2.4 g/dL — ABNORMAL LOW (ref 3.5–5.0)
Anion gap: 9 (ref 5–15)
BUN: 15 mg/dL (ref 8–23)
CO2: 29 mmol/L (ref 22–32)
Calcium: 8 mg/dL — ABNORMAL LOW (ref 8.9–10.3)
Chloride: 94 mmol/L — ABNORMAL LOW (ref 98–111)
Creatinine, Ser: 3.78 mg/dL — ABNORMAL HIGH (ref 0.61–1.24)
GFR, Estimated: 17 mL/min — ABNORMAL LOW (ref >60)
Glucose, Bld: 101 mg/dL — ABNORMAL HIGH (ref 70–99)
Phosphorus: 3.6 mg/dL (ref 2.5–4.6)
Potassium: 3.4 mmol/L — ABNORMAL LOW (ref 3.5–5.1)
Sodium: 132 mmol/L — ABNORMAL LOW (ref 135–145)

## 2021-10-09 LAB — MAGNESIUM: Magnesium: 1.5 mg/dL — ABNORMAL LOW (ref 1.7–2.4)

## 2021-10-09 LAB — LIPASE, BLOOD: Lipase: 49 U/L (ref 11–51)

## 2021-10-09 LAB — TSH: TSH: 10.025 u[IU]/mL — ABNORMAL HIGH (ref 0.350–4.500)

## 2021-10-09 LAB — AMMONIA: Ammonia: 38 umol/L — ABNORMAL HIGH (ref 9–35)

## 2021-10-09 LAB — CBC
HCT: 23.7 % — ABNORMAL LOW (ref 39.0–52.0)
Hemoglobin: 8 g/dL — ABNORMAL LOW (ref 13.0–17.0)
MCH: 32.4 pg (ref 26.0–34.0)
MCHC: 33.8 g/dL (ref 30.0–36.0)
MCV: 96 fL (ref 80.0–100.0)
Platelets: 144 10*3/uL — ABNORMAL LOW (ref 150–400)
RBC: 2.47 MIL/uL — ABNORMAL LOW (ref 4.22–5.81)
RDW: 19.8 % — ABNORMAL HIGH (ref 11.5–15.5)
WBC: 4.6 10*3/uL (ref 4.0–10.5)
nRBC: 0 % (ref 0.0–0.2)

## 2021-10-09 LAB — PROCALCITONIN: Procalcitonin: 0.49 ng/mL

## 2021-10-09 MED ORDER — ALTEPLASE 2 MG IJ SOLR
2.0000 mg | Freq: Once | INTRAMUSCULAR | Status: DC | PRN
Start: 2021-10-09 — End: 2021-10-09

## 2021-10-09 MED ORDER — LIDOCAINE HCL (PF) 1 % IJ SOLN
5.0000 mL | INTRAMUSCULAR | Status: DC | PRN
Start: 2021-10-09 — End: 2021-10-09

## 2021-10-09 MED ORDER — PENTAFLUOROPROP-TETRAFLUOROETH EX AERO: 1.0000 "application " | INHALATION_SPRAY | CUTANEOUS | Status: DC | PRN

## 2021-10-09 MED ORDER — HYDROMORPHONE HCL 1 MG/ML IJ SOLN
0.5000 mg | Freq: Once | INTRAMUSCULAR | Status: AC
  Administered 2021-10-09: 0.5 mg via INTRAVENOUS
  Filled 2021-10-09: qty 0.5

## 2021-10-09 MED ORDER — CHLORHEXIDINE GLUCONATE CLOTH 2 % EX PADS
6.0000 | MEDICATED_PAD | Freq: Every day | CUTANEOUS | Status: DC
  Administered 2021-10-09: 6 via TOPICAL

## 2021-10-09 MED ORDER — ANTICOAGULANT SODIUM CITRATE 4% (200MG/5ML) IV SOLN: 5.0000 mL | Status: DC | PRN

## 2021-10-09 MED ORDER — LIDOCAINE-PRILOCAINE 2.5-2.5 % EX CREA: 1.0000 "application " | TOPICAL_CREAM | CUTANEOUS | Status: DC | PRN

## 2021-10-09 NOTE — Progress Notes (Signed)
PROGRESS NOTE  Daniel [name] WOE:[id number] DOB: November 09, 1960   PCP: [name], NP  [facility] is [facility]: [facility].  Uses wheelchair at baseline.  Drives self to HD  DOA: 10/05/2021 LOS: 4  Chief complaints Chief Complaint  Patient presents with   Rectal Bleeding   Shoulder Pain     Brief Narrative / Interim history: 61 year old M with PMH of ESRD on HD MWF, IDA, recurrent GIB, duodenal ulcer, gastric ulcer, small bowel AVM, PAD, chronic pain syndrome, DM-2, orthostatic hypotension, EtOH use, tobacco use disorder, anxiety, left BKA, QTc and recent hospitalization from 6/3-6/4 for symptomatic anemia in the setting of recurrent GI bleed and multifocal pneumonia returning with ongoing melena, and admitted for acute on chronic blood loss anemia.  Hgb 7.3 (9.0 two days prior and 11.3 six days prior).  Seems he is on low-dose aspirin.  Also question about compliance with PPI.   Patient was transfused 1 unit.  GI consulted.  He underwent a small bowel endoscopy that showed normal esophagus, mild PHG, few friable gastric polyps at the pylorus (treated with APC), blood in duodenal bulb and first portion of duodenum, a single recently bleeding AVM in the duodenum (treated with APC), a single bleeding AVM in duodenum (treated with APC) and multiple duodenal polyps.  Capsule endoscopy underway to evaluate AVM burden, which is pending but GI signed off.  Hospital course significant for multifocal pneumonia for which she is on antibiotics.  Therapy recommended CIR but CIR recommending SNF/ALF  Subjective: Seen and examined earlier this morning.  No major events overnight of this morning.  Continues to endorse left flank pain.  Reports some shortness of breath, nausea and cough.  He is oriented but not a great historian.  Objective: Vitals:   10/08/21 1807 10/08/21 2106 10/09/21 0524 10/09/21 0938  BP: (!) 174/56 121/71 137/86 (!) 167/53  Pulse: (!) 59 82 84 78  Resp: '16 18 18 18   '$ Temp: 98.7 F (37.1 C) 97.9 F (36.6 C) 98 F (36.7 C) 98.4 F (36.9 C)  TempSrc: Oral Oral Oral   SpO2: 100% 95% 100% 98%  Weight: 102.1 kg     Height:        Examination:  GENERAL: No apparent distress.  Nontoxic. HEENT: MMM.  Vision and hearing grossly intact.  NECK: Supple.  No apparent JVD.  RESP:  No IWOB.  Fair aeration bilaterally. CVS:  RRR. Heart sounds normal.  ABD/GI/GU: BS+. Abd soft.  Tenderness over LLQ. MSK/EXT: Left AKA.  Trace RLE edema. SKIN: no apparent skin lesion or wound NEURO: Awake and alert. Oriented appropriately.  No apparent focal neuro deficit. PSYCH: Calm. Normal affect.   Procedures:  6/11-small bowel endoscopy as above. 6/11-capsule endoscopy  Microbiology summarized: None  Assessment and plan: Principal Problem:   Acute on chronic blood loss anemia Active Problems:   Melena   ESRD (end stage renal disease) on dialysis (HCC)   Hepatocellular carcinoma (HCC)   Essential hypertension   AVM (arteriovenous malformation) of small bowel, acquired with hemorrhage   Prolonged QT interval   Upper GI bleed   Thrombocytopenia (HCC)   AVM (arteriovenous malformation) of duodenum, acquired   Tobacco use disorder   Acute on chronic diastolic CHF (congestive heart failure) (HCC)   Recurrent gastrointestinal hemorrhage   Chronic pain disorder   Rotator cuff syndrome   Epistaxis   Hx of BKA, left (HCC)   Intestinal polyps   Portal hypertensive gastropathy (HCC)   Hyperkalemia   Left flank pain  Multifocal pneumonia   LLQ abdominal pain   Physical deconditioning   Cognitive decline  Acute on chronic blood loss anemia in the setting of recurrent upper GI bleed Duodenal AVM/PHG/gastritis-noted on his small bowel endoscopy again.  Endoscopy as above. Hemodynamically stable.  Patient is on low-dose aspirin for PAD.  Denies other NSAID use.  Not sure if he is compliant with his Protonix.  Supposed to take twice daily.  Transfused 1 unit on  6/10 and another 1 on 6/12.  Anemia panel with iron deficiency. Recent Labs    10/05/21 1602 10/05/21 2043 10/06/21 0447 10/06/21 0755 10/06/21 0935 10/07/21 0244 10/07/21 1442 10/08/21 0424 10/08/21 1201 10/09/21 0201  HGB 7.7* 7.3* 7.5* 8.2* 7.6* 7.2* 8.6* 8.1* 8.1* 8.0*  -Continue holding aspirin -Received IV Feraheme on 6/13. -P.o. Protonix 40 mg twice daily.  Octreotide discontinued. -Monitor CBC daily -Follow capsule endoscopy result.   Multifocal pneumonia: Patient had acute dyspnea with chest tightness on 6/13.  CXR and CT chest concerning for multifocal pneumonia.  Pro-Cal elevated to 0.45>> 0.49. -Continue ceftriaxone and doxycycline 6/13>>  Left flank and left lower quadrant pain/tenderness: Unclear etiology of this.  CT abdomen and pelvis without contrast without significant finding to explain this.  Inconsistent history as patient states having similar pain prior to hospitalization at times.  Denies UTI symptoms.  Tenderness to palpation with gentle palpation.  No skin rash.  He is already on ceftriaxone for pneumonia -Pain control  Acute bilateral shoulder pain: Chronic rotator cuff syndrome noted on his MRIs on 5/23.  Pain improved. -Pain control with Tylenol, Percocet and IV Dilaudid   ESRD on HD MWF -HD per nephrology.   Acute on chronic diastolic CHF: TTE in 05/7060 with LVEF of 55 to 60%, no RWMA and RVSP of 48.4. Appears euvolemic on exam but acute dyspnea with markedly elevated BNP concerning for CHF exacerbation.  -Fluid management by dialysis.  Also making urine.   History of hepatocellular carcinoma -Oncology following.   Left BKA-appears stable. -Continue Ace wrap   Tobacco use disorder: Smokes about half a pack a day.  Encouraged cessation. -Continue nicotine patch  Mild thrombocytopenia: Stable.  Monitor.  Hyperkalemia: Likely due to renal failure.  Resolved. -Per nephrology.  Epistaxis: Prior to arrival.  Resolved.  Physical  deconditioning: Patient with left BKA.  Uses wheelchair at baseline.  Seems to [facility] the [facility].  Drive self to HD.  Cognition is not great.  Does not know what medications he takes  Goal of care: Significant comorbidity as above.  Recurrent hospitalization.  Poor prognosis. -Palliative care consulted for goals of care discussion  Obesity:  Body mass index is 31.39 kg/m.  Could be underestimated by BKA       DVT prophylaxis:  SCDs Start: 10/05/21 1229  Code Status: Full code Family Communication: None at bedside. Level of care: Med-Surg Status is: Inpatient Remains inpatient appropriate because: Acute blood loss anemia, community-acquired pneumonia and acute CHF   Final disposition: SNF? Consultants:  Gastroenterology Nephrology Palliative medicine  Sch Meds:  Scheduled Meds:  sodium chloride   Intravenous Once   atorvastatin  40 mg Oral Daily   Chlorhexidine Gluconate Cloth  6 each Topical Q0600   Chlorhexidine Gluconate Cloth  6 each Topical Q0600   Chlorhexidine Gluconate Cloth  6 each Topical Q0600   doxycycline  100 mg Oral Q12H   gabapentin  100 mg Oral TID   nicotine  14 mg Transdermal Daily   pantoprazole  40 mg Oral  BID   Continuous Infusions:  cefTRIAXone (ROCEPHIN)  IV 2 g (10/08/21 1338)   PRN Meds:.acetaminophen **OR** acetaminophen, camphor-menthol, HYDROmorphone (DILAUDID) injection, hydrOXYzine, oxyCODONE-acetaminophen  Antimicrobials: Anti-infectives (From admission, onward)    Start     Dose/Rate Route Frequency Ordered Stop   10/08/21 1400  cefTRIAXone (ROCEPHIN) 2 g in sodium chloride 0.9 % 100 mL IVPB        2 g 200 mL/hr over 30 Minutes Intravenous Every 24 hours 10/08/21 1310 10/13/21 1359   10/08/21 1400  doxycycline (VIBRA-TABS) tablet 100 mg        100 mg Oral Every 12 hours 10/08/21 1310 10/13/21 0959        I have personally reviewed the following labs and images: CBC: Recent Labs  Lab 10/03/21 0900 10/05/21 0729  10/06/21 0447 10/06/21 0755 10/07/21 0244 10/07/21 1442 10/08/21 0424 10/08/21 1201 10/09/21 0201  WBC 5.3   < > 5.2  --  5.5  --  5.1 5.0 4.6  NEUTROABS 2.9  --   --   --  3.1  --   --   --   --   HGB 9.0*   < > 7.5*   < > 7.2* 8.6* 8.1* 8.1* 8.0*  HCT 26.7*   < > 22.5*   < > 21.7* 25.0* 23.9* 24.3* 23.7*  MCV 97.4   < > 96.6  --  99.5  --  95.6 97.2 96.0  PLT 142*   < > 141*  --  155  --  143* 147* 144*   < > = values in this interval not displayed.   BMP &GFR Recent Labs  Lab 10/05/21 0729 10/06/21 0447 10/06/21 0755 10/07/21 0244 10/08/21 0424 10/09/21 0201  NA 143 139 140 140 135 132*  K 4.4 5.8* 5.0 5.3* 4.3 3.4*  CL 96* 96* 101 99 98 94*  CO2 28 24  --  '22 24 29  '$ GLUCOSE 96 81 82 95 100* 101*  BUN 41* 52* 49* 57* 29* 15  CREATININE 5.96* 7.00* 7.50* 7.86* 5.37* 3.78*  CALCIUM 9.0 9.0  --  8.8* 8.5* 8.0*  MG  --   --   --   --  1.6* 1.5*  PHOS  --   --   --  5.2* 4.5 3.6   Estimated Creatinine Clearance: 25 mL/min (A) (by C-G formula based on SCr of 3.78 mg/dL (H)). Liver & Pancreas: Recent Labs  Lab 10/03/21 0900 10/05/21 0729 10/06/21 0447 10/07/21 0244 10/08/21 0424 10/09/21 0201  AST 38 46* 48*  --   --   --   ALT '11 16 15  '$ --   --   --   ALKPHOS 144* 124 110  --   --   --   BILITOT 0.7 0.8 0.8  --   --   --   PROT 8.3* 7.5 7.1  --   --   --   ALBUMIN 3.2* 2.3* 2.3* 2.4* 2.5* 2.4*   Recent Labs  Lab 10/09/21 0201  LIPASE 49   Recent Labs  Lab 10/09/21 0201  AMMONIA 38*   Diabetic: No results for input(s): "HGBA1C" in the last 72 hours. Recent Labs  Lab 10/06/21 0859 10/07/21 0829  GLUCAP 78 103*   Cardiac Enzymes: No results for input(s): "CKTOTAL", "CKMB", "CKMBINDEX", "TROPONINI" in the last 168 hours. No results for input(s): "PROBNP" in the last 8760 hours. Coagulation Profile: Recent Labs  Lab 10/05/21 0729  INR 1.1   Thyroid Function Tests: Recent Labs  10/09/21 0201  TSH 10.025*   Lipid Profile: No results for  input(s): "CHOL", "HDL", "LDLCALC", "TRIG", "CHOLHDL", "LDLDIRECT" in the last 72 hours. Anemia Panel: Recent Labs    10/07/21 0244  FERRITIN 159  TIBC 321  IRON 90   Urine analysis:    Component Value Date/Time   COLORURINE YELLOW 07/31/2019 2128   APPEARANCEUR CLEAR 07/31/2019 2128   LABSPEC 1.009 07/31/2019 2128   PHURINE 5.0 07/31/2019 2128   GLUCOSEU NEGATIVE 07/31/2019 2128   HGBUR SMALL (A) 07/31/2019 2128   BILIRUBINUR NEGATIVE 07/31/2019 2128   KETONESUR NEGATIVE 07/31/2019 2128   PROTEINUR 100 (A) 09/03/2021 1150   UROBILINOGEN 0.2 12/31/2009 0746   NITRITE NEGATIVE 07/31/2019 2128   LEUKOCYTESUR NEGATIVE 07/31/2019 2128   Sepsis Labs: Invalid input(s): "PROCALCITONIN", "LACTICIDVEN"  Microbiology: No results found for this or any previous visit (from the past 240 hour(s)).  Radiology Studies: CT CHEST ABDOMEN PELVIS WO CONTRAST  Result Date: 10/09/2021 CLINICAL DATA:  Left lower quadrant abdominal pain EXAM: CT CHEST, ABDOMEN AND PELVIS WITHOUT CONTRAST TECHNIQUE: Multidetector CT imaging of the chest, abdomen and pelvis was performed following the standard protocol without IV contrast. RADIATION DOSE REDUCTION: This exam was performed according to the departmental dose-optimization program which includes automated exposure control, adjustment of the mA and/or kV according to patient size and/or use of iterative reconstruction technique. COMPARISON:  CTA 09/28/2021, CT abdomen pelvis 08/05/2021 FINDINGS: CT CHEST FINDINGS Cardiovascular: Extensive multi-vessel coronary artery calcification. Global cardiac size within normal limits. Hypoattenuation of the cardiac blood pool is present in keeping with at least mild anemia. No pericardial effusion. Central pulmonary arteries are of normal caliber. Extensive atherosclerotic calcification within the thoracic aorta. No aortic aneurysm. Mediastinum/Nodes: No enlarged mediastinal, hilar, or axillary lymph nodes. Thyroid gland,  trachea, and esophagus demonstrate no significant findings. Lungs/Pleura: Extensive multifocal asymmetric pulmonary consolidation and ground-glass pulmonary infiltrate has progressed within the mid and lower lung zones most in keeping with progressive pneumonic infiltrate in the acute setting. Small bilateral pleural effusions have developed. Small fluid within the right major fissure. No pneumothorax. No central obstructing lesion. Musculoskeletal: No acute bone abnormality. No lytic or blastic bone lesion. CT ABDOMEN PELVIS FINDINGS Hepatobiliary: Liver contour is mildly nodular in keeping with underlying cirrhosis. Known central hepatic masses, previously characterized as LI-RADS 5 lesions, are not well delineated or characterized on this noncontrast examination. No intra or extrahepatic biliary ductal dilation. Cholelithiasis without pericholecystic inflammatory change noted. Pancreas: Mild peripancreatic edema within the pancreatico duodenal groove is again seen and is unchanged, possibly reflecting changes of mild edematous/interstitial pancreatitis. The pancreas is otherwise unremarkable on this noncontrast examination. Spleen: Unremarkable Adrenals/Urinary Tract: The adrenal glands are unremarkable. The kidneys are normal in position. Mild bilateral renal cortical atrophy. 3 mm punctate nonobstructing calculus within the lower pole the left kidney. No hydronephrosis. No new ureteral calculi. The bladder is unremarkable. Stomach/Bowel: Stomach is within normal limits. Appendix absent. No evidence of bowel wall thickening, distention, or inflammatory changes. Vascular/Lymphatic: Moderate aortoiliac atherosclerotic calcification. Prominent atherosclerotic calcification within the visualized lower extremity arterial outflow. Extensive atherosclerotic calcification within the internal iliac distributions bilaterally. No aortic aneurysm. No pathologic adenopathy within the abdomen and pelvis. Reproductive:  Prostate is unremarkable. Other: No abdominal wall hernia. Musculoskeletal: No acute bone abnormality. Degenerative changes are seen within the lumbar spine. IMPRESSION: 1. Extensive multifocal asymmetric pulmonary consolidation and ground-glass pulmonary infiltrate has progressed within the mid and lower lung zones most in keeping with progressive pneumonic infiltrate in the acute setting. 2.  Small bilateral pleural effusions have developed. 3. Unchanged mild peripancreatic edema within the pancreatico duodenal groove, which could reflect changes of mild edematous/interstitial pancreatitis. 4. Known central hepatic masses, previously characterized as LI-RADS 5 lesions, are not well delineated or characterized on this noncontrast examination. 5. Cirrhosis. 6. Cholelithiasis. 7. Nonobstructing left nephrolithiasis. Aortic Atherosclerosis (ICD10-I70.0). Electronically Signed   By: Fidela Salisbury M.D.   On: 10/09/2021 02:10      Linder Prajapati T. [place]  If 7PM-7AM, please contact night-coverage [email or web address] 10/09/2021, 1:35 PM

## 2021-10-09 NOTE — Progress Notes (Addendum)
  Inpatient Rehabilitation Admissions Coordinator   Met with patient at bedside for rehab assessment. Patient with recent SNF at [place] he says for 2 months. Discharged to a room he has at boarding house. Does not have any caregiver supports. Drives self to hemodialysis. Ideally an ILF/ALF would be best dispo, but he states he can not have his car at these facilities. He has been unable to find other living dispo. Family not supportive. I currently do not have a CIR bed available to admit and feel best rehab venue options would be SNF to ALF. He is aware of my recommendation. Reandra SW TOC made aware of my recommendation.  Danne Baxter, RN, MSN Rehab Admissions Coordinator [phone]

## 2021-10-09 NOTE — Progress Notes (Signed)
Pt receives out-pt HD at [facility] on MWF. Will assist as needed.   Tajuan Dufault Renal Navigator [phone] 

## 2021-10-09 NOTE — Progress Notes (Addendum)
[facility] Progress Note   Subjective:   Not feeling very well this AM, reports he is SOB and nauseated. Breathing did not improve with HD and he was started on antibiotics for pneumonia. Denies chest pain and dizziness. Continues to report intermittent L sided abdominal pain.   Objective Vitals:   10/08/21 1807 10/08/21 2106 10/09/21 0524 10/09/21 0938  BP: (!) 174/56 121/71 137/86 (!) 167/53  Pulse: (!) 59 82 84 78  Resp: '16 18 18 18  '$ Temp: 98.7 F (37.1 C) 97.9 F (36.6 C) 98 F (36.7 C) 98.4 F (36.9 C)  TempSrc: Oral Oral Oral   SpO2: 100% 95% 100% 98%  Weight: 102.1 kg     Height:       Physical Exam General: alert male, in NAD Heart: RRR, no murmurs, rubs or gallops Lungs: + rales bilateral lower lobes. Respirations unlabored on RA Abdomen: Soft, non-distended, +BS. Mild TTP LLQ Extremities: no edema b/l lower extremities Dialysis Access: LUE AVF + t/b  Additional Objective Labs: Basic Metabolic Panel: Recent Labs  Lab 10/07/21 0244 10/08/21 0424 10/09/21 0201  NA 140 135 132*  K 5.3* 4.3 3.4*  CL 99 98 94*  CO2 '22 24 29  '$ GLUCOSE 95 100* 101*  BUN 57* 29* 15  CREATININE 7.86* 5.37* 3.78*  CALCIUM 8.8* 8.5* 8.0*  PHOS 5.2* 4.5 3.6   Liver Function Tests: Recent Labs  Lab 10/03/21 0900 10/05/21 0729 10/06/21 0447 10/07/21 0244 10/08/21 0424 10/09/21 0201  AST 38 46* 48*  --   --   --   ALT '11 16 15  '$ --   --   --   ALKPHOS 144* 124 110  --   --   --   BILITOT 0.7 0.8 0.8  --   --   --   PROT 8.3* 7.5 7.1  --   --   --   ALBUMIN 3.2* 2.3* 2.3* 2.4* 2.5* 2.4*   Recent Labs  Lab 10/09/21 0201  LIPASE 49   CBC: Recent Labs  Lab 10/03/21 0900 10/05/21 0729 10/06/21 0447 10/06/21 0755 10/07/21 0244 10/07/21 1442 10/08/21 0424 10/08/21 1201 10/09/21 0201  WBC 5.3   < > 5.2  --  5.5  --  5.1 5.0 4.6  NEUTROABS 2.9  --   --   --  3.1  --   --   --   --   HGB 9.0*   < > 7.5*   < > 7.2*   < > 8.1* 8.1* 8.0*  HCT 26.7*   < >  22.5*   < > 21.7*   < > 23.9* 24.3* 23.7*  MCV 97.4   < > 96.6  --  99.5  --  95.6 97.2 96.0  PLT 142*   < > 141*  --  155  --  143* 147* 144*   < > = values in this interval not displayed.   Blood Culture    Component Value Date/Time   SDES BLOOD BLOOD RIGHT HAND 08/16/2021 2049   SPECREQUEST  08/16/2021 2049    BOTTLES DRAWN AEROBIC AND ANAEROBIC Blood Culture adequate volume   CULT  08/16/2021 2049    NO GROWTH 5 DAYS Performed at [facility], [place] [address] [place], [place] [address]    REPTSTATUS 08/21/2021 FINAL 08/16/2021 2049    Cardiac Enzymes: No results for input(s): "CKTOTAL", "CKMB", "CKMBINDEX", "TROPONINI" in the last 168 hours. CBG: Recent Labs  Lab 10/06/21 0859 10/07/21 0829  GLUCAP  78 103*   Iron Studies:  Recent Labs    10/07/21 0244  IRON 90  TIBC 321  FERRITIN 159   '@lablastinr3'$ @ Studies/Results: CT CHEST ABDOMEN PELVIS WO CONTRAST  Result Date: 10/09/2021 CLINICAL DATA:  Left lower quadrant abdominal pain EXAM: CT CHEST, ABDOMEN AND PELVIS WITHOUT CONTRAST TECHNIQUE: Multidetector CT imaging of the chest, abdomen and pelvis was performed following the standard protocol without IV contrast. RADIATION DOSE REDUCTION: This exam was performed according to the departmental dose-optimization program which includes automated exposure control, adjustment of the mA and/or kV according to patient size and/or use of iterative reconstruction technique. COMPARISON:  CTA 09/28/2021, CT abdomen pelvis 08/05/2021 FINDINGS: CT CHEST FINDINGS Cardiovascular: Extensive multi-vessel coronary artery calcification. Global cardiac size within normal limits. Hypoattenuation of the cardiac blood pool is present in keeping with at least mild anemia. No pericardial effusion. Central pulmonary arteries are of normal caliber. Extensive atherosclerotic calcification within the thoracic aorta. No aortic aneurysm. Mediastinum/Nodes: No enlarged mediastinal, hilar, or axillary  lymph nodes. Thyroid gland, trachea, and esophagus demonstrate no significant findings. Lungs/Pleura: Extensive multifocal asymmetric pulmonary consolidation and ground-glass pulmonary infiltrate has progressed within the mid and lower lung zones most in keeping with progressive pneumonic infiltrate in the acute setting. Small bilateral pleural effusions have developed. Small fluid within the right major fissure. No pneumothorax. No central obstructing lesion. Musculoskeletal: No acute bone abnormality. No lytic or blastic bone lesion. CT ABDOMEN PELVIS FINDINGS Hepatobiliary: Liver contour is mildly nodular in keeping with underlying cirrhosis. Known central hepatic masses, previously characterized as LI-RADS 5 lesions, are not well delineated or characterized on this noncontrast examination. No intra or extrahepatic biliary ductal dilation. Cholelithiasis without pericholecystic inflammatory change noted. Pancreas: Mild peripancreatic edema within the pancreatico duodenal groove is again seen and is unchanged, possibly reflecting changes of mild edematous/interstitial pancreatitis. The pancreas is otherwise unremarkable on this noncontrast examination. Spleen: Unremarkable Adrenals/Urinary Tract: The adrenal glands are unremarkable. The kidneys are normal in position. Mild bilateral renal cortical atrophy. 3 mm punctate nonobstructing calculus within the lower pole the left kidney. No hydronephrosis. No new ureteral calculi. The bladder is unremarkable. Stomach/Bowel: Stomach is within normal limits. Appendix absent. No evidence of bowel wall thickening, distention, or inflammatory changes. Vascular/Lymphatic: Moderate aortoiliac atherosclerotic calcification. Prominent atherosclerotic calcification within the visualized lower extremity arterial outflow. Extensive atherosclerotic calcification within the internal iliac distributions bilaterally. No aortic aneurysm. No pathologic adenopathy within the abdomen and  pelvis. Reproductive: Prostate is unremarkable. Other: No abdominal wall hernia. Musculoskeletal: No acute bone abnormality. Degenerative changes are seen within the lumbar spine. IMPRESSION: 1. Extensive multifocal asymmetric pulmonary consolidation and ground-glass pulmonary infiltrate has progressed within the mid and lower lung zones most in keeping with progressive pneumonic infiltrate in the acute setting. 2. Small bilateral pleural effusions have developed. 3. Unchanged mild peripancreatic edema within the pancreatico duodenal groove, which could reflect changes of mild edematous/interstitial pancreatitis. 4. Known central hepatic masses, previously characterized as LI-RADS 5 lesions, are not well delineated or characterized on this noncontrast examination. 5. Cirrhosis. 6. Cholelithiasis. 7. Nonobstructing left nephrolithiasis. Aortic Atherosclerosis (ICD10-I70.0). Electronically Signed   By: Fidela Salisbury M.D.   On: 10/09/2021 02:10   DG Abd Portable 1V  Result Date: 10/08/2021 CLINICAL DATA:  Chest pain, foreign body EXAM: PORTABLE ABDOMEN - 1 VIEW COMPARISON:  10/08/2011 FINDINGS: Nonobstructive pattern of bowel gas. Gas present to the distal colon. Capsule endoscope remains in the right hemiabdomen, possibly within distal small bowel or the cecal base. No free air  in the abdomen. IMPRESSION: Capsule endoscope remains in the right hemiabdomen, possibly within distal small bowel or the cecal base. No free air in the abdomen. Electronically Signed   By: Delanna Ahmadi M.D.   On: 10/08/2021 12:42   DG Chest Port 1 View  Result Date: 10/08/2021 CLINICAL DATA:  Chest pain. EXAM: PORTABLE CHEST 1 VIEW COMPARISON:  Chest radiograph September 28, 2021. FINDINGS: Patchy airspace opacities throughout both lungs. No visible pleural effusions or pneumothorax. Mild enlargement of the cardiac silhouette. No displaced fracture. Incompletely imaged left axillary vascular stent. IMPRESSION: 1. Patchy airspace opacities  throughout both lungs, concerning for multifocal pneumonia. 2. Mild cardiomegaly. Electronically Signed   By: Margaretha Sheffield M.D.   On: 10/08/2021 12:25   DG Abd Portable 1V  Result Date: 10/07/2021 CLINICAL DATA:  Rectal bleeding EXAM: PORTABLE ABDOMEN - 1 VIEW COMPARISON:  None Available. FINDINGS: In Skopic capsule noted in the right lower abdomen, likely within distal small bowel loops. No bowel obstruction. No organomegaly or free air. No suspicious calcification. Mild cardiomegaly. Perihilar airspace opacities noted which likely reflects edema. IMPRESSION: Endoscopic capsule in the right lower quadrant, likely in distal small bowel. No bowel obstruction. Electronically Signed   By: Rolm Baptise M.D.   On: 10/07/2021 20:20   Medications:  cefTRIAXone (ROCEPHIN)  IV 2 g (10/08/21 1338)    sodium chloride   Intravenous Once   atorvastatin  40 mg Oral Daily   Chlorhexidine Gluconate Cloth  6 each Topical Q0600   Chlorhexidine Gluconate Cloth  6 each Topical Q0600   doxycycline  100 mg Oral Q12H   gabapentin  100 mg Oral TID   nicotine  14 mg Transdermal Daily   pantoprazole  40 mg Oral BID    Dialysis Orders: MWF- [facility] ([facility]) 4hrs, BFR 450, DFR 500,  EDW 109kg, 2K/ 2Ca No Heparin bolus No Micera recently ordered    Assessment/Plan: Acute on chronic blood loss anemia-GI following; s/p small bowel enteroscopyy. Reports ongoing abdominal pain. Continue to hold ASA/AC; On Octreotide and protonix. Management per primary team/GI. Hgb stable at 8.0.  ESRD - on HD MWF. Had extra HD for volume removal yesterday, see below. Resume MWF schedule today.  Hypertension/volume- had extra HD yesterday due to SOB, but reports breathing did not improve. CT with diffuse pulmonary infiltrates. May be a combination of fluid overload and pneumonia. On antibiotics per primary team and will continue UF with HD as tolerated.   Anemia of CKD -Hgb now stable. See above. Not on ESA  outpatient due to hepatocellular carcinoma. Continue to transfuse PRN.  Secondary Hyperparathyroidism - Calcium and phos controlled. Not on VDRA outpatient. Monitor trends here. Nutrition - Advance to renal diet when clinically stable Abdominal pain: CT showed nonobstructing stones in the left kidney. Already on antibiotics but will order a UA to rule out UTI  Anice Paganini, PA-C 10/09/2021, 10:02 AM  [facility] Pager: [phone]  Pt seen, examined and agree w A/P as above. Infiltrates on CT scan of chest are dense and multifocal c/w infection/ other , but does not look like CHF/ edema.  Kelly Splinter  MD 10/09/2021, 3:52 PM

## 2021-10-09 NOTE — Progress Notes (Signed)
SLP Cancellation Note  Patient Details Name: Daniel [name] MRN: [id number] DOB: 1960-07-13   Cancelled treatment:       Reason Eval/Treat Not Completed: Patient declined, no reason specified (Pt approached for speech-language-cognition evaluation. However, pt stated "I wanna go through that again" and "I don't wanna do that". Upon clarificiation, pt stated that he does not want this evaluation to be completed at all while he is admitted and that he was not simply referring today. SLP will therefore sign off per pt's request, but he was advised that SLP will be can return on a subsequent date should he change his mind.)  Gar Glance I. Hardin Negus, [place], [place] Office number [phone] Pager [place] 10/09/2021, 11:20 AM

## 2021-10-09 NOTE — Progress Notes (Signed)
OT Cancellation Note  Patient Details Name: Daniel [name] MRN: [id number] DOB: [birth year]   Cancelled Treatment:    Reason Eval/Treat Not Completed: Patient at procedure or test/ unavailable (pt in HD will follow up as schedule permits)  Lynnda Child, OTD, OTR/L Acute Rehab [phone]) [phone]   Kaylyn Lim 10/09/2021, 3:22 PM

## 2021-10-10 ENCOUNTER — Inpatient Hospital Stay: Payer: Medicare Other | Admitting: Hematology

## 2021-10-10 ENCOUNTER — Inpatient Hospital Stay: Payer: Medicare Other

## 2021-10-10 ENCOUNTER — Other Ambulatory Visit (HOSPITAL_COMMUNITY): Payer: Self-pay

## 2021-10-10 DIAGNOSIS — C220 Liver cell carcinoma: Secondary | ICD-10-CM | POA: Diagnosis not present

## 2021-10-10 DIAGNOSIS — E0781 Sick-euthyroid syndrome: Secondary | ICD-10-CM

## 2021-10-10 DIAGNOSIS — D62 Acute posthemorrhagic anemia: Secondary | ICD-10-CM | POA: Diagnosis not present

## 2021-10-10 DIAGNOSIS — R778 Other specified abnormalities of plasma proteins: Secondary | ICD-10-CM

## 2021-10-10 DIAGNOSIS — E1122 Type 2 diabetes mellitus with diabetic chronic kidney disease: Secondary | ICD-10-CM

## 2021-10-10 DIAGNOSIS — N186 End stage renal disease: Secondary | ICD-10-CM | POA: Diagnosis not present

## 2021-10-10 DIAGNOSIS — K921 Melena: Secondary | ICD-10-CM | POA: Diagnosis not present

## 2021-10-10 LAB — CBC
HCT: 25.6 % — ABNORMAL LOW (ref 39.0–52.0)
Hemoglobin: 8.8 g/dL — ABNORMAL LOW (ref 13.0–17.0)
MCH: 32.8 pg (ref 26.0–34.0)
MCHC: 34.4 g/dL (ref 30.0–36.0)
MCV: 95.5 fL (ref 80.0–100.0)
Platelets: 141 10*3/uL — ABNORMAL LOW (ref 150–400)
RBC: 2.68 MIL/uL — ABNORMAL LOW (ref 4.22–5.81)
RDW: 20 % — ABNORMAL HIGH (ref 11.5–15.5)
WBC: 5.2 10*3/uL (ref 4.0–10.5)
nRBC: 0 % (ref 0.0–0.2)

## 2021-10-10 LAB — RENAL FUNCTION PANEL
Albumin: 2.5 g/dL — ABNORMAL LOW (ref 3.5–5.0)
Anion gap: 14 (ref 5–15)
BUN: 12 mg/dL (ref 8–23)
CO2: 26 mmol/L (ref 22–32)
Calcium: 8.3 mg/dL — ABNORMAL LOW (ref 8.9–10.3)
Chloride: 93 mmol/L — ABNORMAL LOW (ref 98–111)
Creatinine, Ser: 3.45 mg/dL — ABNORMAL HIGH (ref 0.61–1.24)
GFR, Estimated: 19 mL/min — ABNORMAL LOW (ref >60)
Glucose, Bld: 111 mg/dL — ABNORMAL HIGH (ref 70–99)
Phosphorus: 2.5 mg/dL (ref 2.5–4.6)
Potassium: 3.6 mmol/L (ref 3.5–5.1)
Sodium: 133 mmol/L — ABNORMAL LOW (ref 135–145)

## 2021-10-10 LAB — PROCALCITONIN: Procalcitonin: 0.58 ng/mL

## 2021-10-10 LAB — T4, FREE: Free T4: 0.78 ng/dL (ref 0.61–1.12)

## 2021-10-10 LAB — MAGNESIUM: Magnesium: 1.7 mg/dL (ref 1.7–2.4)

## 2021-10-10 MED ORDER — PANTOPRAZOLE SODIUM 40 MG PO TBEC
40.0000 mg | DELAYED_RELEASE_TABLET | Freq: Two times a day (BID) | ORAL | 2 refills | Status: DC
  Filled 2021-10-10: qty 60, 30d supply, fill #0

## 2021-10-10 MED ORDER — AMOXICILLIN-POT CLAVULANATE 875-125 MG PO TABS
1.0000 | ORAL_TABLET | Freq: Every evening | ORAL | 0 refills | Status: AC
  Filled 2021-10-10: qty 4, 4d supply, fill #0

## 2021-10-10 MED ORDER — DOXYCYCLINE HYCLATE 100 MG PO TABS
100.0000 mg | ORAL_TABLET | Freq: Two times a day (BID) | ORAL | 0 refills | Status: AC
  Filled 2021-10-10: qty 8, 4d supply, fill #0

## 2021-10-10 MED ORDER — CHLORHEXIDINE GLUCONATE CLOTH 2 % EX PADS
6.0000 | MEDICATED_PAD | Freq: Every day | CUTANEOUS | Status: DC
  Administered 2021-10-10: 6 via TOPICAL

## 2021-10-10 MED ORDER — DARBEPOETIN ALFA 100 MCG/0.5ML IJ SOSY: 100.0000 ug | PREFILLED_SYRINGE | INTRAMUSCULAR | 0 refills | Status: DC

## 2021-10-10 NOTE — Progress Notes (Signed)
TRH night cross cover note:  I was notified by RN of the patient's report of chronic bilateral shoulder pain.  He currently has order for as needed Percocet, but is not eligible for his next associated dose until 6:05 AM. As this is only about 30 minutes away, I conveyed to RN that it is okay to give his next prn dose of Percocet now.      Babs Bertin, DO Hospitalist

## 2021-10-10 NOTE — Progress Notes (Signed)
Occupational Therapy Treatment Patient Details Name: Daniel [name] MRN: [id number] DOB: 1960/05/14 Today's Date: 10/10/2021   History of present illness Pt is a 61 y/o M presenting to ED on 6/10 with dark stool and epistaxis; recent hospitalization from 6/3-6/4 for symptomatic anemia in the setting of recurrent GI bleed and multifocal PNA. PMH includes ESRD on HD MWF, recurrent GIB, duodenal ulcer, gastric ulcer, small bowel AVM, PAD, chronic pain syndrome, DM2, orthostatic hypotension, tobacco use disorder, anxiety, and L BKA.   OT comments  Pt currently modified independent for simulated selfcare tasks, standing balance, and functional transfers with use of the RW.  He reports at home he doesn't use the RW and instead just completes squat pivot transfers to the wheelchair, 3:1, or his car.  He was however able to stand and pivot to the bedside recliner with the RW at modified independent level. Feel he is close to functional baseline.  Recommend HHOT eval for setup and safety recommendations.    Recommendations for follow up therapy are one component of a multi-disciplinary discharge planning process, led by the attending physician.  Recommendations may be updated based on patient status, additional functional criteria and insurance authorization.    Follow Up Recommendations  Home health OT    Assistance Recommended at Discharge PRN           Precautions / Restrictions Precautions Precautions: Fall Precaution Comments: hx of L BKA Restrictions Weight Bearing Restrictions: Yes LLE Weight Bearing: Non weight bearing       Mobility Bed Mobility                    Transfers Overall transfer level: Modified independent Equipment used: Rolling walker (2 wheels), None Transfers: Sit to/from Stand, Bed to chair/wheelchair/BSC Sit to Stand: Modified independent (Device/Increase time) Stand pivot transfers: Modified independent (Device/Increase time) (with RW) Squat pivot  transfers: Modified independent (Device/Increase time)       General transfer comment: He was ble to perform stand pivot at mod I with RW as well as squat pivot without an assistive device.     Balance Overall balance assessment: Needs assistance   Sitting balance-Leahy Scale: Good     Standing balance support: Single extremity supported, During functional activity, Reliant on assistive device for balance Standing balance-Leahy Scale: Poor Standing balance comment: Pt needs use of an assistive device for support in standing secondary to L BKA.                           ADL either performed or assessed with clinical judgement   ADL Overall ADL's : Needs assistance/impaired                         Toilet Transfer: Rolling walker (2 wheels);Modified Independent;BSC/3in1 [employer] forces technical officer Details (indicate cue type and reason): simulated to bedside recliner as pt declined need to toilet Toileting- Clothing Manipulation and Hygiene: Modified independent;Sit to/from stand       Functional mobility during ADLs: Modified independent;Rolling walker (2 wheels) General ADL Comments: Pt dressed when therapists entered room.  He reported that he does not have a prosthesis yet and pleasantly stated that he was going home today.  He was able to complete sit to stand with the RW and take a few steps forward and backwards as well as transferring to the recliner at modified independent level.  Just min instructional cueing to reach back to the surface  when attempting to sit down.  At home he reports not using the walker however and just completing squat pivots to and from the the wheelchair and 3:1.  He would perform lateral leans for clothing and hygiene even though he looked safe with this therapist when standing and simulating it.  He also drove himself to dialysis and back, leaving his wheelchair out in the driveway so he could transfer back to it when he came home.                Cognition Arousal/Alertness: Awake/alert Behavior During Therapy: WFL for tasks assessed/performed Overall Cognitive Status: Within Functional Limits for tasks assessed                                 General Comments: Pt able to answer questions appropriately                   Pertinent Vitals/ Pain       Pain Assessment Pain Assessment: No/denies pain         Frequency  Min 2X/week        Progress Toward Goals  OT Goals(current goals can now be found in the care plan section)  Progress towards OT goals: Goals met/education completed, patient discharged from [facility] Discharge plan remains appropriate    Co-evaluation    PT/OT/SLP Co-Evaluation/Treatment: Yes Reason for Co-Treatment: For patient/therapist safety;To address functional/ADL transfers   OT goals addressed during session: ADL's and self-care      AM-PAC OT "6 Clicks" Daily Activity     Outcome Measure   Help from another person eating meals?: None Help from another person taking care of personal grooming?: None Help from another person toileting, which includes using toliet, bedpan, or urinal?: None Help from another person bathing (including washing, rinsing, drying)?: None Help from another person to put on and taking off regular upper body clothing?: None Help from another person to put on and taking off regular lower body clothing?: None 6 Click Score: 24    End of Session Equipment Utilized During Treatment: Gait belt  OT Visit Diagnosis: Muscle weakness (generalized) (M62.81);Unsteadiness on feet (R26.81)   Activity Tolerance Patient tolerated treatment well   Patient Left in bed;with call bell/phone within reach   Nurse Communication Mobility status        Time: 1020-1043 OT Time Calculation (min): 23 min  Charges: OT General Charges $OT Visit: 1 Visit OT Treatments $Self Care/Home Management : 8-22 mins  Katonya Blecher OTR/L 10/10/2021, 11:15 AM

## 2021-10-10 NOTE — Progress Notes (Signed)
[facility] Progress Note   Subjective:   Reports his breathing is better today, is on room air. Complains of shoulder pain which is chronic. Denies CP, dizziness, abdominal pain and nausea.   Objective Vitals:   10/09/21 2010 10/10/21 0252 10/10/21 0445 10/10/21 0449  BP: 128/80  (!) 81/33 (!) 159/98  Pulse: 85  85   Resp: 18  18   Temp: 98 F (36.7 C)  97.6 F (36.4 C)   TempSrc: Oral  Oral   SpO2: 97%  100%   Weight:  100.7 kg    Height:       Physical Exam General: Alert male in NAD Heart: RRR, no murmurs, rubs or gallops Lungs: CTA bilaterally without wheezing, rhonchi or rales Abdomen: Soft, non-distended, +BS Extremities: No edema b/l lower extremities Dialysis Access:  LUE AVF + t/b  Additional Objective Labs: Basic Metabolic Panel: Recent Labs  Lab 10/08/21 0424 10/09/21 0201 10/10/21 0135  NA 135 132* 133*  K 4.3 3.4* 3.6  CL 98 94* 93*  CO2 '24 29 26  '$ GLUCOSE 100* 101* 111*  BUN 29* 15 12  CREATININE 5.37* 3.78* 3.45*  CALCIUM 8.5* 8.0* 8.3*  PHOS 4.5 3.6 2.5   Liver Function Tests: Recent Labs  Lab 10/05/21 0729 10/06/21 0447 10/07/21 0244 10/08/21 0424 10/09/21 0201 10/10/21 0135  AST 46* 48*  --   --   --   --   ALT 16 15  --   --   --   --   ALKPHOS 124 110  --   --   --   --   BILITOT 0.8 0.8  --   --   --   --   PROT 7.5 7.1  --   --   --   --   ALBUMIN 2.3* 2.3*   < > 2.5* 2.4* 2.5*   < > = values in this interval not displayed.   Recent Labs  Lab 10/09/21 0201  LIPASE 49   CBC: Recent Labs  Lab 10/07/21 0244 10/07/21 1442 10/08/21 0424 10/08/21 1201 10/09/21 0201 10/10/21 0135  WBC 5.5  --  5.1 5.0 4.6 5.2  NEUTROABS 3.1  --   --   --   --   --   HGB 7.2*   < > 8.1* 8.1* 8.0* 8.8*  HCT 21.7*   < > 23.9* 24.3* 23.7* 25.6*  MCV 99.5  --  95.6 97.2 96.0 95.5  PLT 155  --  143* 147* 144* 141*   < > = values in this interval not displayed.   Blood Culture    Component Value Date/Time   SDES BLOOD  BLOOD RIGHT HAND 08/16/2021 2049   SPECREQUEST  08/16/2021 2049    BOTTLES DRAWN AEROBIC AND ANAEROBIC Blood Culture adequate volume   CULT  08/16/2021 2049    NO GROWTH 5 DAYS Performed at [facility], [place] [address] [place], [place] [address]    REPTSTATUS 08/21/2021 FINAL 08/16/2021 2049    Cardiac Enzymes: No results for input(s): "CKTOTAL", "CKMB", "CKMBINDEX", "TROPONINI" in the last 168 hours. CBG: Recent Labs  Lab 10/06/21 0859 10/07/21 0829  GLUCAP 78 103*   Iron Studies: No results for input(s): "IRON", "TIBC", "TRANSFERRIN", "FERRITIN" in the last 72 hours. '@lablastinr3'$ @ Studies/Results: CT CHEST ABDOMEN PELVIS WO CONTRAST  Result Date: 10/09/2021 CLINICAL DATA:  Left lower quadrant abdominal pain EXAM: CT CHEST, ABDOMEN AND PELVIS WITHOUT CONTRAST TECHNIQUE: Multidetector CT imaging of the chest, abdomen and pelvis  was performed following the standard protocol without IV contrast. RADIATION DOSE REDUCTION: This exam was performed according to the departmental dose-optimization program which includes automated exposure control, adjustment of the mA and/or kV according to patient size and/or use of iterative reconstruction technique. COMPARISON:  CTA 09/28/2021, CT abdomen pelvis 08/05/2021 FINDINGS: CT CHEST FINDINGS Cardiovascular: Extensive multi-vessel coronary artery calcification. Global cardiac size within normal limits. Hypoattenuation of the cardiac blood pool is present in keeping with at least mild anemia. No pericardial effusion. Central pulmonary arteries are of normal caliber. Extensive atherosclerotic calcification within the thoracic aorta. No aortic aneurysm. Mediastinum/Nodes: No enlarged mediastinal, hilar, or axillary lymph nodes. Thyroid gland, trachea, and esophagus demonstrate no significant findings. Lungs/Pleura: Extensive multifocal asymmetric pulmonary consolidation and ground-glass pulmonary infiltrate has progressed within the mid and lower lung  zones most in keeping with progressive pneumonic infiltrate in the acute setting. Small bilateral pleural effusions have developed. Small fluid within the right major fissure. No pneumothorax. No central obstructing lesion. Musculoskeletal: No acute bone abnormality. No lytic or blastic bone lesion. CT ABDOMEN PELVIS FINDINGS Hepatobiliary: Liver contour is mildly nodular in keeping with underlying cirrhosis. Known central hepatic masses, previously characterized as LI-RADS 5 lesions, are not well delineated or characterized on this noncontrast examination. No intra or extrahepatic biliary ductal dilation. Cholelithiasis without pericholecystic inflammatory change noted. Pancreas: Mild peripancreatic edema within the pancreatico duodenal groove is again seen and is unchanged, possibly reflecting changes of mild edematous/interstitial pancreatitis. The pancreas is otherwise unremarkable on this noncontrast examination. Spleen: Unremarkable Adrenals/Urinary Tract: The adrenal glands are unremarkable. The kidneys are normal in position. Mild bilateral renal cortical atrophy. 3 mm punctate nonobstructing calculus within the lower pole the left kidney. No hydronephrosis. No new ureteral calculi. The bladder is unremarkable. Stomach/Bowel: Stomach is within normal limits. Appendix absent. No evidence of bowel wall thickening, distention, or inflammatory changes. Vascular/Lymphatic: Moderate aortoiliac atherosclerotic calcification. Prominent atherosclerotic calcification within the visualized lower extremity arterial outflow. Extensive atherosclerotic calcification within the internal iliac distributions bilaterally. No aortic aneurysm. No pathologic adenopathy within the abdomen and pelvis. Reproductive: Prostate is unremarkable. Other: No abdominal wall hernia. Musculoskeletal: No acute bone abnormality. Degenerative changes are seen within the lumbar spine. IMPRESSION: 1. Extensive multifocal asymmetric pulmonary  consolidation and ground-glass pulmonary infiltrate has progressed within the mid and lower lung zones most in keeping with progressive pneumonic infiltrate in the acute setting. 2. Small bilateral pleural effusions have developed. 3. Unchanged mild peripancreatic edema within the pancreatico duodenal groove, which could reflect changes of mild edematous/interstitial pancreatitis. 4. Known central hepatic masses, previously characterized as LI-RADS 5 lesions, are not well delineated or characterized on this noncontrast examination. 5. Cirrhosis. 6. Cholelithiasis. 7. Nonobstructing left nephrolithiasis. Aortic Atherosclerosis (ICD10-I70.0). Electronically Signed   By: Fidela Salisbury M.D.   On: 10/09/2021 02:10   DG Abd Portable 1V  Result Date: 10/08/2021 CLINICAL DATA:  Chest pain, foreign body EXAM: PORTABLE ABDOMEN - 1 VIEW COMPARISON:  10/08/2011 FINDINGS: Nonobstructive pattern of bowel gas. Gas present to the distal colon. Capsule endoscope remains in the right hemiabdomen, possibly within distal small bowel or the cecal base. No free air in the abdomen. IMPRESSION: Capsule endoscope remains in the right hemiabdomen, possibly within distal small bowel or the cecal base. No free air in the abdomen. Electronically Signed   By: Delanna Ahmadi M.D.   On: 10/08/2021 12:42   DG Chest Port 1 View  Result Date: 10/08/2021 CLINICAL DATA:  Chest pain. EXAM: PORTABLE CHEST 1 VIEW COMPARISON:  Chest radiograph September 28, 2021. FINDINGS: Patchy airspace opacities throughout both lungs. No visible pleural effusions or pneumothorax. Mild enlargement of the cardiac silhouette. No displaced fracture. Incompletely imaged left axillary vascular stent. IMPRESSION: 1. Patchy airspace opacities throughout both lungs, concerning for multifocal pneumonia. 2. Mild cardiomegaly. Electronically Signed   By: Margaretha Sheffield M.D.   On: 10/08/2021 12:25   Medications:  cefTRIAXone (ROCEPHIN)  IV 2 g (10/09/21 1847)    sodium  chloride   Intravenous Once   atorvastatin  40 mg Oral Daily   doxycycline  100 mg Oral Q12H   gabapentin  100 mg Oral TID   nicotine  14 mg Transdermal Daily   pantoprazole  40 mg Oral BID    Dialysis Orders: MWF- [place]) 4hrs, BFR 450, DFR 500,  EDW 109kg, 2K/ 2Ca No Heparin bolus No Micera recently ordered  Assessment/Plan: Acute on chronic blood loss anemia-GI following; s/p small bowel enteroscopy. Reports intermittent ongoing abdominal pain. Continue to hold ASA/AC; On Octreotide and protonix. Management per primary team/GI. Hgb stable at 8.8.  ESRD - on HD MWF. Had extra HD for volume removal see below. Continue MWF schedule Hypertension/volume- had extra HD Tuesday due to SOB. CT with diffuse pulmonary infiltrates. On antibiotics per primary team and will continue UF with HD as tolerated.  Breathing is improved today Anemia of CKD -Hgb now stable. See above. Not on ESA outpatient due to hepatocellular carcinoma. Continue to transfuse PRN.  Secondary Hyperparathyroidism - Calcium and phos controlled. Not on VDRA outpatient. Monitor trends here. Nutrition - Advance to renal diet when clinically stable Abdominal pain: CT showed nonobstructing stones in the left kidney. Already on antibiotics but will order a UA to rule out UTI    Anice Paganini, PA-C 10/10/2021, 9:39 AM  [facility] Pager: [phone]

## 2021-10-10 NOTE — TOC Initial Note (Signed)
Transition of Care [facility]) - Initial/Assessment Note    Patient Details  Name: Daniel [name] MRN: [id number] Date of Birth: 11-11-1960  [facility]) CM/SW Contact:    Milinda Antis, [place] Phone Number: 10/10/2021, 9:12 AM  Clinical Narrative:                 CSW received consult for possible SNF placement at time of discharge after CIR denial. CSW spoke with patient at bedside. Patient expressed understanding of PT recommendation and is not agreeable to SNF placement at time of discharge. The patient reported that he went to [facility] for 60 days in the past and they charged him.  The patient reports that he is open to home health and the individuals who live in the home with him will assist if needed.  No further questions reported at this time.   Skilled Nursing Rehab Facilities-   [email or web address]   Ratings out of 5 possible   Name Address  Phone # [place] Inspection Overall  [facility] [address], [place] '4 5 2 3  '$ [facility]  [address] [place], [place] [phone] '3 2 5 5  '$ [facility] [place], [place] '3 1 1 1  '$ [place], [place] '3 2 4 4  '$ [facility] [address] [place] '1 1 2 1  '$ [place] [place] [phone] '2 1 4 3  '$ [facility] [address], [place] '5 2 3 4  '$ [facility] [address], [place] '5 2 2 3  '$ [address] ([place]) [place], [place] [phone] '5 1 2 2  '$ [facility] [phone], [place] [phone] '4 1 2 1  '$ [facility] [address] [facility] [phone] '4 1 2 1  '$ [facility] ([place]) [place]. [place], [place] [phone] '4 1 1 1  '$ [facility] Flock [address] [phone] '3 2 4 4          '$ [place] '4 2 3 3  '$ [facility] [place] [address], [place] '4 1 5 4  '$ [facility], [place], [address] [phone] '2 1 1 1  '$ [facility] [address], [address] [phone] '2 1 3 2          '$ [address] (no [facility]) [place] [address], [place] [phone] '4 5 5 5  '$ [facility] (No Humana) [address] [phone] [address], [place] '3 1 4 3  '$ [facility] (No UHC) [place], [place] [phone] '5 5 5 5  '$ [facility] [address], [address] [phone] '3 2 4 4  '$ [place] [facility] [address] [place] '1 1 2 1  '$ [place] [address], [address] [place] '2 1 1 1  '$ [place], [place] '5 2 4 5  '$ [facility] [address] [place] '3 1 1 1  '$ [facility] [place], [place] '2 1 2 1          '$ [facility] [address], [place] '1 1 1 1  '$ [place] [address], [place]  [phone] '2 4 2 2  '$ [facility]  [address], [place] [phone] 5 2 3  [place], [place] '2 1 1 1  '$ [place] (No Humana) [address], [place] [phone] '2 1 3 2  '$ [facility] [address] [place] [phone] '3 1 1 1          '$ [facility] [place], [place] '5 4 5 5  '$ [facility])  [facility] [address], [place] '2 2 3 3  '$ [facility]) [place] [address], [place] '3 2 4 4  '$ [place] [address] [place] '4 3 4 4  '$ [address] [place], [place] '3 3 1 1  '$ [address] [facility]) [place], [place] '2 2 4 4     '$ Expected Discharge Plan: OP Rehab Barriers  to Discharge: Continued Medical Work up   Patient Goals and CMS Choice Patient states their goals for this hospitalization and ongoing recovery are:: To return home CMS Medicare.gov Compare Post Acute Care list provided to:: Patient Choice offered to / list presented to : Patient  Expected Discharge Plan and Services Expected Discharge Plan: OP Rehab   Discharge Planning Services: CM Consult Post Acute Care Choice: IP Rehab Living arrangements for the past 2 months: [place]                 DME Arranged: N/A DME Agency: NA         [place] Agency: NA        Prior Living Arrangements/Services Living arrangements for the past 2 months: [place] with:: Self Patient language and need for interpreter reviewed:: Yes Do you feel safe going back to the place where you live?: Yes      Need for Family Participation in Patient Care: Yes (Comment) Care giver support system in place?: Yes (comment) Current home services: DME (Wheelchair ,walker, cane) Criminal Activity/Legal Involvement Pertinent to Current Situation/Hospitalization: No - Comment as needed  Activities of Daily Living Home Assistive Devices/Equipment: [id number] (specify type), Wheelchair ADL Screening (condition at time of admission) Patient's cognitive ability adequate to safely complete daily activities?: No Is the patient deaf or have difficulty hearing?: No Does the patient have difficulty seeing, even when wearing glasses/contacts?: No Does the patient have difficulty concentrating, remembering, or making decisions?: No Patient able to express need for assistance with ADLs?: Yes Does the patient have difficulty dressing or bathing?: No Independently performs ADLs?: No Does the patient have difficulty walking or climbing stairs?: No Weakness of Legs: None Weakness of Arms/Hands: None  Permission Sought/Granted Permission sought to share information with : Case Manager, Environmental consultant, Family Supports Permission granted to share information with : Yes, Verbal Permission Granted              Emotional Assessment Appearance:: Appears stated age Attitude/Demeanor/Rapport: Engaged, Gracious Affect (typically observed): Accepting, Appropriate, Calm, Hopeful Orientation: : Oriented to Self, Oriented to Place, Oriented to  Time, Oriented to Situation Alcohol / Substance Use: Not Applicable Psych Involvement: No (comment)  Admission diagnosis:  Rectal bleeding [K62.5] Symptomatic anemia [D64.9] Acute on chronic blood loss anemia [D62] Patient Active Problem List   Diagnosis Date Noted   Multifocal pneumonia 10/08/2021   LLQ abdominal pain 10/08/2021   Physical deconditioning 10/08/2021   Cognitive decline 10/08/2021   Hyperkalemia 10/07/2021   Left flank pain 10/07/2021   Intestinal polyps 10/06/2021   Portal hypertensive gastropathy ([place]) 10/06/2021   Acute  on chronic blood loss anemia 10/05/2021   Rotator cuff syndrome 10/05/2021   Epistaxis 10/05/2021   Hx of BKA, left ([place]) 10/05/2021   Recurrent gastrointestinal hemorrhage 09/28/2021   Edema of amputation stump of left lower extremity ([place]) 09/28/2021   Chronic pain disorder 09/28/2021   GI bleed 08/23/2021   Amputation stump infection ([place])    Sepsis ([place]) 08/16/2021   Right foot pain 08/07/2021   Lung mass 08/06/2021   Hepatocellular carcinoma ([place]) 08/06/2021   Melena    Pressure injury of skin 07/05/2021   Right leg pain 07/04/2021   GI bleeding 07/04/2021   Acute lower GI bleeding 07/03/2021   Acute on chronic diastolic CHF (congestive heart failure) ([place]) 06/14/2021   GERD (gastroesophageal reflux disease) 06/14/2021   Chest pain 06/13/2021   Diabetic foot infection ([place]) 04/14/2021   PAD (peripheral artery disease) ([place]) 04/08/2021   Gastritis and gastroduodenitis    ESRD (end stage renal disease) on dialysis ([place]) 03/25/2021   Diabetes mellitus type 2, controlled, with  complications ([place]) 02/54/2706   QT prolongation 03/25/2021   Alcohol abuse with intoxication ([place]) 03/25/2021   Cocaine abuse ([place]) 03/25/2021   Tobacco use disorder 03/25/2021   Class 2 obesity due to excess calories with body mass index (BMI) of 39.0 to 39.9 in adult 03/25/2021   AVM (arteriovenous malformation) of duodenum, acquired    Peptic ulcer disease    Upper GI bleed 03/24/2021   Chalazion of right upper eyelid 03/24/2021   Thrombocytopenia ([place]) 03/24/2021   Visual hallucination 03/24/2021   Bilateral leg edema 03/24/2021   Benign neoplasm of duodenum, jejunum, and ileum    Anemia due to chronic kidney disease 02/23/2021   Prolonged QT interval 02/23/2021   Rotator cuff tear arthropathy of right shoulder 01/23/2021   Alcohol dependence ([place]) 01/13/2021   Gastric ulcer with hemorrhage 01/13/2021   Generalized weakness    Transaminitis 01/10/2021   History of alcohol abuse 10/27/2020   History of cocaine abuse ([place]) 10/27/2020   Nicotine dependence, cigarettes, uncomplicated 23/76/2831   Acute GI bleeding 08/10/2020   Duodenal ulcer with hemorrhage    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   AVM (arteriovenous malformation) of small bowel, acquired with hemorrhage    Symptomatic anemia 07/31/2019   Chronic hepatitis C without hepatic coma ([place]) 07/11/2019   Iron deficiency anemia 03/30/2019   Alcohol use disorder 03/30/2019   B12 deficiency 03/30/2019   Orthostatic hypotension 01/22/2019   Anemia    Essential hypertension 06/29/2016   PCP:  [name], NP Pharmacy:   [facility], [place] [address] Phone: [phone] Fax: [phone]     Social Determinants of Health (SDOH) Interventions    Readmission Risk Interventions    02/27/2021   12:51 PM 07/07/2019    4:03 PM  Readmission Risk Prevention Plan  Transportation Screening Complete Complete   PCP or Specialist Appt within 3-5 Days  Complete  HRI or [place]  Complete  Social Work Consult for [place] Planning/Counseling  Complete  Palliative Care Screening  Not Applicable  Medication Review Press photographer) Complete Complete  PCP or Specialist appointment within 3-5 days of discharge Complete   HRI or [place] Complete   SW Recovery Care/Counseling Consult Complete   [place] Not Applicable

## 2021-10-10 NOTE — Progress Notes (Addendum)
Palliative progress note:  Consult received.  Chart review completed.  On arrival to patient's room he was being discharged by his nurse.  I introduced myself and palliative medicine. Recommend referral to outpatient palliative by patient's primary care provider.  Mariana Kaufman, AGNP-C Palliative Medicine  No charge

## 2021-10-10 NOTE — Discharge Summary (Signed)
Physician Discharge Summary  Daniel [name]:[id number] DOB: 1960/10/21 DOA: 10/05/2021  PCP: Daniel Perna, NP  Admit date: 10/05/2021 Discharge date: 10/10/2021 Admitted From: Boardinghouse Disposition: Boardinghouse Recommendations for Outpatient Follow-up:  Follow ups as below. Please obtain CBC and BMP Recommend outpatient referral to palliative care Please follow up on the following pending results: None  Home Health: PT/OT/RN Equipment/Devices: Sliding board and 3 in 1 commode.  Patient has wheelchair  Discharge Condition: Stable CODE STATUS: Full code  Follow-up Information     Daniel Perna, NP. Schedule an appointment as soon as possible for a visit in 1 week(s).   Specialty: Internal Medicine Contact information: [place] [address] [address], [facility] up.   Specialty: Home Health Services Why: Someone will call you to schedule first home visit. Contact information: [place] [address] [phone]         [facility] Follow up.   Why: Someone will call you to schedule first home visit. Contact information: [name] 61 year old M with PMH of ESRD on HD MWF, IDA, recurrent GIB, duodenal ulcer, gastric ulcer, small bowel AVM, PAD, chronic pain syndrome, DM-2, orthostatic hypotension, EtOH use, tobacco use disorder, anxiety, left BKA, QTc and recent hospitalization from 6/3-6/4 for symptomatic anemia in the setting of recurrent GI bleed and multifocal pneumonia returning with ongoing melena, and admitted for acute on chronic blood loss anemia.  Hgb 7.3 (9.0 two days prior and 11.3 six days prior).  Seems he is on low-dose aspirin.  Also question about compliance with PPI.    Patient was transfused 1 unit.  GI consulted.  He underwent a small  bowel endoscopy that showed normal esophagus, mild PHG, few friable gastric polyps at the pylorus (treated with APC), blood in duodenal bulb and first portion of duodenum, a single recently bleeding AVM in the duodenum (treated with APC), a single bleeding AVM in duodenum (treated with APC) and multiple duodenal polyps.  Capsule endoscopy inconclusive.  GI signed off.  Patient is discharged on p.o. Protonix 40 mg twice daily.  Low-dose aspirin discontinued on discharge.   Hospital course significant for multifocal pneumonia for which he was started on ceftriaxone and doxycycline and discharged on p.o. Augmentin and doxycycline to complete treatment course.  On the day of discharge, patient felt well and ready to go home.   Home health PT/OT ordered as recommended by therapy.  We also ordered home health RN to ensure medication compliance, and home health aide for assistance.   See individual problem list below for more.   Problems addressed during this hospitalization Principal Problem:   Acute on chronic blood loss anemia Active Problems:   Melena   ESRD (end stage renal disease) on dialysis (HCC)   Hepatocellular carcinoma (HCC)   Essential hypertension   AVM (arteriovenous malformation) of small bowel, acquired with hemorrhage   Prolonged QT interval   Upper GI bleed   Thrombocytopenia (HCC)   AVM (arteriovenous malformation) of duodenum, acquired   Tobacco use disorder   Acute on chronic diastolic CHF (congestive heart failure) (HCC)   Recurrent gastrointestinal hemorrhage   Chronic pain disorder   Rotator cuff syndrome   Epistaxis   Hx of BKA, left (HCC)   Intestinal polyps  Portal hypertensive gastropathy (HCC)   Hyperkalemia   Left flank pain   Multifocal pneumonia   LLQ abdominal pain   Physical deconditioning   Cognitive decline   Acute on chronic blood loss anemia in the setting of recurrent upper GI bleed Duodenal AVM/PHG/gastritis-noted on his small bowel  endoscopy again.  Capsule endoscopy inconclusive. Hemodynamically stable.  Patient is on low-dose aspirin for PAD.  Denies other NSAID use.  Not sure if he is compliant with his Protonix.  Transfused 1 unit on 6/10 and another 1 on 6/12.  Anemia panel with iron deficiency.  Received IV Feraheme on 6/13.  H&H remained stable.  Cleared for discharge by GI. -Discontinue low-dose aspirin -Advised to avoid NSAID. -P.o. Protonix 40 mg twice daily.  -Check CBC in 1 to 2 weeks.   Multifocal pneumonia: Patient had acute dyspnea with chest tightness on 6/13.  CXR and CT chest concerning for multifocal pneumonia.  Pro-Cal slightly elevated.  Respiratory symptoms improved.  No oxygen requirement. -Ceftriaxone and doxycycline 6/13>> doxycycline and Augmentin 6/15-6/18.   Left flank and left lower quadrant pain/tenderness: Unclear etiology of this.  CT without significant finding to explain this.  Inconsistent history as patient states having similar pain prior to hospitalization at times.  Denies UTI symptoms.  Tenderness to palpation with gentle palpation. No skin rash.   Acute bilateral shoulder pain: Chronic rotator cuff syndrome noted on his MRIs on 5/23.  Pain improved. -Continue home medications.   ESRD on HD MWF -Resume outpatient HD on schedule   Acute on chronic diastolic CHF: TTE in 06/4194 with LVEF of 55 to 60%, no RWMA and RVSP of 48.4. Appears euvolemic on exam but acute dyspnea with markedly elevated BNP  -Fluid management by dialysis.  Also making urine.   History of hepatocellular carcinoma -Outpatient follow-up with oncology and IR.   Tobacco use disorder: Smokes about half a pack a day.  Encouraged cessation.   Mild thrombocytopenia: Stable.  Hyperkalemia: Likely due to renal failure.  Resolved.   Epistaxis: Prior to arrival.  Resolved.  Euthyroid sick syndrome: Elevated TSH with normal free T4. -Recheck TFT in about 4 weeks  Mild troponin elevation: Likely demand ischemia and  poor renal clearance.  No significant delta   Physical deconditioning/left BKA: Patient with left BKA.  In the process of getting prosthesis.  Uses wheelchair now.  Seems to stay at the boardinghouse.  Drive self to HD.  [facility] PT/OT ordered.   Goal of care: Significant comorbidity as above.  Recurrent hospitalization.  Poor long-term prognosis. -Recommend outpatient referral to palliative care   Obesity:  Body mass index is 30.96 kg/m.          Pressure Injury 07/04/21 Buttocks Medial Stage 2 -  Partial thickness loss of dermis presenting as a shallow open injury with a red, pink wound bed without slough. two small areas of broken skin in between buttocks. (Active)  07/04/21 0936  Location: Buttocks  Location Orientation: Medial  Staging: Stage 2 -  Partial thickness loss of dermis presenting as a shallow open injury with a red, pink wound bed without slough.  Wound Description (Comments): two small areas of broken skin in between buttocks.  Present on Admission:     Vital signs Vitals:   10/10/21 0252 10/10/21 0445 10/10/21 0449 10/10/21 0953  BP:  (!) 81/33 (!) 159/98 (!) 156/94  Pulse:  85  79  Temp:  97.6 F (36.4 C)  97.6 F (36.4 C)  Resp:  18  18  Height:      Weight: 100.7 kg     SpO2:  100%  99%  TempSrc:  Oral  Oral  BMI (Calculated): 30.98        Discharge exam  GENERAL: No apparent distress.  Nontoxic. HEENT: MMM.  Vision and hearing grossly intact.  NECK: Supple.  No apparent JVD.  RESP:  No IWOB.  Fair aeration bilaterally. CVS:  RRR. Heart sounds normal.  ABD/GI/GU: BS+. Abd soft, NTND.  MSK/EXT: Left AKA. SKIN: no apparent skin lesion or wound NEURO: Awake and alert. Oriented appropriately.  No apparent focal neuro deficit. PSYCH: Calm. Normal affect.   Discharge Instructions Discharge Instructions     Call MD for:  difficulty breathing, headache or visual disturbances   Complete by: As directed    Call MD for:  extreme fatigue   Complete by:  As directed    Call MD for:  persistant dizziness or light-headedness   Complete by: As directed    Call MD for:  temperature >100.4   Complete by: As directed    Diet - low sodium heart healthy   Complete by: As directed    Discharge instructions   Complete by: As directed    It has been a pleasure taking care of you!  You were hospitalized due to bleeding and pneumonia for which you have been treated.  Your bleeding seems to have subsided.  We have started you on antibiotic for the pneumonia.  It is very important that you complete the whole course of antibiotic to treat the pneumonia completely.  In regards to bleeding, we strongly recommend you continue taking your Protonix twice a day.  We have stopped your aspirin.  We strongly recommend not taking any over-the-counter pain medication other than plain Tylenol.  Follow-up with your primary care doctor in 1 to 2 weeks or sooner if needed.   Take care,   Increase activity slowly   Complete by: As directed    No wound care   Complete by: As directed       Allergies as of 10/10/2021       Reactions   Seroquel [quetiapine] Other (See Comments)   Tardive kinesia/dystonia   Dilaudid [hydromorphone Hcl] Itching, Other (See Comments)   Pt reports itchiness after IM injection         Medication List     STOP taking these medications    aspirin EC 81 MG tablet   calcium acetate (Phos Binder) 667 MG/5ML Soln Commonly known as: PHOSLYRA   cefdinir 300 MG capsule Commonly known as: OMNICEF   ivabradine 5 MG Tabs tablet Commonly known as: CORLANOR   metoprolol tartrate 100 MG tablet Commonly known as: LOPRESSOR       TAKE these medications    amLODipine 5 MG tablet Commonly known as: NORVASC Take 1 tablet (5 mg total) by mouth daily.   amoxicillin-clavulanate 875-125 MG tablet Commonly known as: AUGMENTIN Take 1 tablet by mouth every evening for 4 days.   atorvastatin 40 MG tablet Commonly known as:  LIPITOR Take 1 tablet (40 mg total) by mouth daily.   Darbepoetin Alfa 100 MCG/0.5ML Sosy injection Commonly known as: ARANESP Inject 0.5 mLs (100 mcg total) into the vein every 7 (seven) days for 8 doses.   doxycycline 100 MG tablet Commonly known as: VIBRA-TABS Take 1 tablet (100 mg total) by mouth every 12 (twelve) hours for 4 days.   gabapentin 100 MG capsule Commonly known as: NEURONTIN Take 1  capsule (100 mg total) by mouth 3 (three) times daily.   oxyCODONE-acetaminophen 5-325 MG tablet Commonly known as: Percocet Take 1 tablet by mouth every 6 (six) hours as needed. What changed: reasons to take this   pantoprazole 40 MG tablet Commonly known as: PROTONIX Take 1 tablet (40 mg total) by mouth 2 (two) times daily before a meal.   traZODone 50 MG tablet Commonly known as: DESYREL Take 1 tablet (50 mg total) by mouth at bedtime as needed for sleep.               Durable Medical Equipment  (From admission, onward)           Start     Ordered   10/10/21 1031  For home use only DME 3 n 1  Once        10/10/21 1031   10/10/21 1031  For home use only DME Other see comment  Once       Comments: Sliding board  Question:  Length of Need  Answer:  Lifetime   10/10/21 1031            Consultations: Nephrology Gastroenterology  Procedures/Studies: None   CT CHEST ABDOMEN PELVIS WO CONTRAST  Result Date: 10/09/2021 CLINICAL DATA:  Left lower quadrant abdominal pain EXAM: CT CHEST, ABDOMEN AND PELVIS WITHOUT CONTRAST TECHNIQUE: Multidetector CT imaging of the chest, abdomen and pelvis was performed following the standard protocol without IV contrast. RADIATION DOSE REDUCTION: This exam was performed according to the departmental dose-optimization program which includes automated exposure control, adjustment of the mA and/or kV according to patient size and/or use of iterative reconstruction technique. COMPARISON:  CTA 09/28/2021, CT abdomen pelvis 08/05/2021  FINDINGS: CT CHEST FINDINGS Cardiovascular: Extensive multi-vessel coronary artery calcification. Global cardiac size within normal limits. Hypoattenuation of the cardiac blood pool is present in keeping with at least mild anemia. No pericardial effusion. Central pulmonary arteries are of normal caliber. Extensive atherosclerotic calcification within the thoracic aorta. No aortic aneurysm. Mediastinum/Nodes: No enlarged mediastinal, hilar, or axillary lymph nodes. Thyroid gland, trachea, and esophagus demonstrate no significant findings. Lungs/Pleura: Extensive multifocal asymmetric pulmonary consolidation and ground-glass pulmonary infiltrate has progressed within the mid and lower lung zones most in keeping with progressive pneumonic infiltrate in the acute setting. Small bilateral pleural effusions have developed. Small fluid within the right major fissure. No pneumothorax. No central obstructing lesion. Musculoskeletal: No acute bone abnormality. No lytic or blastic bone lesion. CT ABDOMEN PELVIS FINDINGS Hepatobiliary: Liver contour is mildly nodular in keeping with underlying cirrhosis. Known central hepatic masses, previously characterized as LI-RADS 5 lesions, are not well delineated or characterized on this noncontrast examination. No intra or extrahepatic biliary ductal dilation. Cholelithiasis without pericholecystic inflammatory change noted. Pancreas: Mild peripancreatic edema within the pancreatico duodenal groove is again seen and is unchanged, possibly reflecting changes of mild edematous/interstitial pancreatitis. The pancreas is otherwise unremarkable on this noncontrast examination. Spleen: Unremarkable Adrenals/Urinary Tract: The adrenal glands are unremarkable. The kidneys are normal in position. Mild bilateral renal cortical atrophy. 3 mm punctate nonobstructing calculus within the lower pole the left kidney. No hydronephrosis. No new ureteral calculi. The bladder is unremarkable.  Stomach/Bowel: Stomach is within normal limits. Appendix absent. No evidence of bowel wall thickening, distention, or inflammatory changes. Vascular/Lymphatic: Moderate aortoiliac atherosclerotic calcification. Prominent atherosclerotic calcification within the visualized lower extremity arterial outflow. Extensive atherosclerotic calcification within the internal iliac distributions bilaterally. No aortic aneurysm. No pathologic adenopathy within the abdomen and pelvis. Reproductive: Prostate is unremarkable.  Other: No abdominal wall hernia. Musculoskeletal: No acute bone abnormality. Degenerative changes are seen within the lumbar spine. IMPRESSION: 1. Extensive multifocal asymmetric pulmonary consolidation and ground-glass pulmonary infiltrate has progressed within the mid and lower lung zones most in keeping with progressive pneumonic infiltrate in the acute setting. 2. Small bilateral pleural effusions have developed. 3. Unchanged mild peripancreatic edema within the pancreatico duodenal groove, which could reflect changes of mild edematous/interstitial pancreatitis. 4. Known central hepatic masses, previously characterized as LI-RADS 5 lesions, are not well delineated or characterized on this noncontrast examination. 5. Cirrhosis. 6. Cholelithiasis. 7. Nonobstructing left nephrolithiasis. Aortic Atherosclerosis (ICD10-I70.0). Electronically Signed   By: Fidela Salisbury M.D.   On: 10/09/2021 02:10   DG Abd Portable 1V  Result Date: 10/08/2021 CLINICAL DATA:  Chest pain, foreign body EXAM: PORTABLE ABDOMEN - 1 VIEW COMPARISON:  10/08/2011 FINDINGS: Nonobstructive pattern of bowel gas. Gas present to the distal colon. Capsule endoscope remains in the right hemiabdomen, possibly within distal small bowel or the cecal base. No free air in the abdomen. IMPRESSION: Capsule endoscope remains in the right hemiabdomen, possibly within distal small bowel or the cecal base. No free air in the abdomen. Electronically  Signed   By: Delanna Ahmadi M.D.   On: 10/08/2021 12:42   DG Chest Port 1 View  Result Date: 10/08/2021 CLINICAL DATA:  Chest pain. EXAM: PORTABLE CHEST 1 VIEW COMPARISON:  Chest radiograph September 28, 2021. FINDINGS: Patchy airspace opacities throughout both lungs. No visible pleural effusions or pneumothorax. Mild enlargement of the cardiac silhouette. No displaced fracture. Incompletely imaged left axillary vascular stent. IMPRESSION: 1. Patchy airspace opacities throughout both lungs, concerning for multifocal pneumonia. 2. Mild cardiomegaly. Electronically Signed   By: Margaretha Sheffield M.D.   On: 10/08/2021 12:25   DG Abd Portable 1V  Result Date: 10/07/2021 CLINICAL DATA:  Rectal bleeding EXAM: PORTABLE ABDOMEN - 1 VIEW COMPARISON:  None Available. FINDINGS: In Skopic capsule noted in the right lower abdomen, likely within distal small bowel loops. No bowel obstruction. No organomegaly or free air. No suspicious calcification. Mild cardiomegaly. Perihilar airspace opacities noted which likely reflects edema. IMPRESSION: Endoscopic capsule in the right lower quadrant, likely in distal small bowel. No bowel obstruction. Electronically Signed   By: Rolm Baptise M.D.   On: 10/07/2021 20:20   DG Abd Portable 1V  Result Date: 10/05/2021 CLINICAL DATA:  Bilateral shoulder pain and dark colored stools. EXAM: PORTABLE ABDOMEN - 1 VIEW COMPARISON:  October 22, 2019 FINDINGS: Multiple overlying radiopaque cardiac lead wires are seen. The bowel gas pattern is normal. No radio-opaque calculi or other significant radiographic abnormality are seen. IMPRESSION: Negative. Electronically Signed   By: Virgina Norfolk M.D.   On: 10/05/2021 15:36   VAS [place] LOWER EXTREMITY VENOUS (DVT)  Result Date: 09/29/2021  Lower Venous DVT Study Patient Name:  Daniel [name]  Date of Exam:   09/28/2021 Medical Rec #: [id number]           Accession #:    [id number] Date of Birth: 1961/03/04           Patient Gender: M Patient Age:   61  years Exam Location:  [name] Procedure:      VAS [place] LOWER EXTREMITY VENOUS (DVT) Referring Phys: Anderson Malta YATES --------------------------------------------------------------------------------  Indications: Left lower extremity swelling.  Risk Factors: History of left BKA. Limitations: Patient unable to tolerate compression maneuvers. Comparison Study: 04/14/2021- negative left lower extremity venous duplex Performing Technologist: Sharion Dove RVS  Examination  Guidelines: A complete evaluation includes B-mode imaging, spectral Doppler, color Doppler, and power Doppler as needed of all accessible portions of each vessel. Bilateral testing is considered an integral part of a complete examination. Limited examinations for reoccurring indications may be performed as noted. The reflux portion of the exam is performed with the patient in reverse Trendelenburg.  +-----+---------------+---------+-----------+----------+--------------+ RIGHTCompressibilityPhasicitySpontaneityPropertiesThrombus Aging +-----+---------------+---------+-----------+----------+--------------+ CFV  Full           Yes      Yes                                 +-----+---------------+---------+-----------+----------+--------------+   +---------+---------------+---------+-----------+----------+--------------+ LEFT     CompressibilityPhasicitySpontaneityPropertiesThrombus Aging +---------+---------------+---------+-----------+----------+--------------+ CFV                     Yes      Yes        patent                   +---------+---------------+---------+-----------+----------+--------------+ SFJ                     Yes      Yes        patent                   +---------+---------------+---------+-----------+----------+--------------+ FV Prox                 Yes      Yes        patent                   +---------+---------------+---------+-----------+----------+--------------+ FV Mid                   Yes      Yes        patent                   +---------+---------------+---------+-----------+----------+--------------+ FV Distal               Yes      Yes        patent                   +---------+---------------+---------+-----------+----------+--------------+ PFV                     Yes      Yes        patent                   +---------+---------------+---------+-----------+----------+--------------+ POP                     Yes      Yes        patent                   +---------+---------------+---------+-----------+----------+--------------+    Summary: RIGHT: - No evidence of common femoral vein obstruction.  LEFT: - No cystic structure found in the popliteal fossa. - No evidence of deep vein thrombosis by color and pulsed wave Doppler involving left lower extremity veins.  *See table(s) above for measurements and observations. Electronically signed by Deitra Mayo MD on 09/29/2021 at 6:31:07 AM.    Final    CT ANGIO AO+BIFEM W & OR WO CONTRAST  Result Date: 09/28/2021 CLINICAL DATA:  Peripheral arterial disease, asymptomatic. EXAM: CT ANGIOGRAPHY OF ABDOMINAL AORTA WITH ILIOFEMORAL RUNOFF TECHNIQUE:  Multidetector CT imaging of the abdomen, pelvis and lower extremities was performed using the standard protocol during bolus administration of intravenous contrast. Multiplanar CT image reconstructions and MIPs were obtained to evaluate the vascular anatomy. RADIATION DOSE REDUCTION: This exam was performed according to the departmental dose-optimization program which includes automated exposure control, adjustment of the mA and/or kV according to patient size and/or use of iterative reconstruction technique. CONTRAST:  138m OMNIPAQUE IOHEXOL 350 MG/ML SOLN COMPARISON:  CTA runoff 07/03/2021 FINDINGS: VASCULAR Aorta: Diffuse atherosclerotic calcifications in the abdominal aorta without aneurysm or significant stenosis. Celiac: Patent without evidence of aneurysm,  dissection, vasculitis or significant stenosis. SMA: Patent without evidence of aneurysm, dissection, vasculitis or significant stenosis. Renals: Both renal arteries are patent without evidence of aneurysm, dissection, vasculitis, fibromuscular dysplasia or significant stenosis. IMA: Patent RIGHT Lower Extremity Inflow: Atherosclerotic calcifications involving the right iliac arteries without significant stenosis. Right common, right internal and right external iliac artery are patent. Outflow: Circumferential calcifications in the right common femoral artery without significant stenosis. Right SFA is diffusely calcified and there appears to be a patent stent in the mid right SFA. Right profunda femoral arteries are heavily calcified but patent. Diffuse atherosclerotic calcifications in the popliteal artery with at least 2 focal areas of stenosis from calcified plaque. Most severe stenosis is the level of the knee joint and similar to the previous examination. Runoff: Right runoff vessels are heavily calcified and difficult to evaluate. Proximal runoff vessels appear to be patent but limited evaluation of the distal runoff arteries. LEFT Lower Extremity Inflow: Left iliac arteries are heavily calcified without significant stenosis. Left common, internal and external iliac arteries are patent. Outflow: Circumferential calcifications in left common femoral artery without significant stenosis. Left profunda femoral arteries are patent. Diffuse plaque in the left SFA with multifocal stenosis. Again noted is high-grade stenosis in the mid left SFA on sequence 5 image 213. High-grade stenosis in the proximal popliteal artery is again noted. Runoff: Below the knee amputation. Limited evaluation of the proximal runoff vessels due to heavily calcified vessels. Veins: No obvious venous abnormality within the limitations of this arterial phase study. Review of the MIP images confirms the above findings. NON-VASCULAR Lower  chest: Airspace disease in the left upper lobe, right middle lobe and some patchy densities in bilateral lower lobes. Lung findings are most compatible with multifocal pneumonia. No significant pleural effusions. Hepatobiliary: Slightly nodular contour of the liver is compatible with underlying cirrhosis. Patient has known lesions in the central aspect of the liver that are poorly characterized on this examination. Limited evaluation of the portal venous system within the liver. Small calcified gallstone without gallbladder distension. Pancreas: Again noted is subtle low-density between the pancreatic head and the duodenum which could represent edema. Otherwise, normal appearance of the pancreas. No pancreatic duct dilatation. Spleen: Normal in size without focal abnormality. Adrenals/Urinary Tract: Normal adrenal glands. Both kidneys are atrophic and compatible with history of end-stage renal disease. Small nonobstructive calculus in the left kidney lower pole. No suspicious renal lesions. No hydronephrosis. Normal appearance of the urinary bladder with mild distension. Stomach/Bowel: No significant bowel dilatation. Mild stranding in the upper abdomen in the gastrohepatic ligament and mild stranding in the porta hepatis. No discrete or focal bowel inflammation. Normal appearance of the stomach. Lymphatic: No significant lymph node enlargement in the abdomen or pelvis. Reproductive: Prostate is unremarkable. Other: No significant ascites. Some stranding in the upper abdomen as described. Negative for free air. Musculoskeletal: Extensive subcutaneous edema involving the  left lower extremity at the stump and in the lower thigh and knee. Small left knee joint effusion. There is increased lucency and bone loss involving the left patella compared to the prior examinations. Again noted is evidence for slightly comminuted patellar fracture and this could be pathologic. Small right knee joint effusion. Sclerotic densities  in the distal tibia probably represent small bone infarct. Subcutaneous edema in the right lower leg. IMPRESSION: VASCULAR 1. Diffuse atherosclerotic disease throughout the abdomen, pelvis and bilateral lower extremities. 2. No significant inflow disease. 3. Bilateral outflow disease. Multifocal stenosis in the right popliteal artery. Multifocal stenosis involving the left runoff vessels as described. 4. Limited evaluation of the runoff vessels due to heavily calcified vessels. 5. Main visceral arteries are patent. NON-VASCULAR 1. Multifocal pneumonia. 2. Bony changes involving the left patella with areas of bone loss and fracture. There is extensive soft tissue swelling in the left lower leg at the knee and stump region. Findings are concerning for osteomyelitis involving the left patella. 3. Cirrhosis. Known hepatic lesions are poorly characterized on this examination. 4. Increased stranding in the upper abdomen and porta hepatis region. Findings could be related to cirrhosis. History of duodenal bleeding treated with argon plasma coagulation. These areas of stranding could be related to prior bleeding and treatment. Difficult to exclude acute on chronic disease in this area. No evidence for a bowel perforation. 5. Diffuse subcutaneous edema in lower extremities particularly in the left knee and left stump region. 6. Nonobstructive left renal calculus. These results will be called to the ordering clinician or representative by the Radiologist Assistant, and communication documented in the PACS or [employer]. Electronically Signed   By: Markus Daft M.D.   On: 09/28/2021 12:34   DG Chest Port 1 View  Result Date: 09/28/2021 CLINICAL DATA:  61 year old male with history of weakness, shoulder pain and dark stools since yesterday. EXAM: PORTABLE CHEST 1 VIEW COMPARISON:  Chest x-ray 09/15/2021. FINDINGS: Lung volumes are low. Areas of interstitial prominence and peribronchial cuffing are noted in the lungs  bilaterally, most severe throughout the left mid to lower lung. No consolidative airspace disease. No pleural effusions. No pneumothorax. No pulmonary nodule or mass noted. Pulmonary vasculature is normal. Heart size is mildly enlarged (unchanged). Upper mediastinal contours are within normal limits. Atherosclerotic calcifications are noted in the thoracic aorta. IMPRESSION: 1. Findings are concerning for acute bronchitis, potentially with developing bronchopneumonia in the left lower lobe. 2. Aortic atherosclerosis. Electronically Signed   By: Vinnie Langton M.D.   On: 09/28/2021 07:59   DG Shoulder Right  Result Date: 09/25/2021 CLINICAL DATA:  Chronic shoulder pain over the last several months. EXAM: RIGHT SHOULDER - 2+ VIEW COMPARISON:  10/13/2018.  MRI 09/17/2021. FINDINGS: Bone humeral joint appears normal. Humeral acromial distance within normal limits. 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 No acute radiographic finding. IMPRESSION: No acute radiographic finding. No evidence of fracture or significant degenerative change by radiography. Chronic proximal humeral medullary bone infarction. Large rotator cuff tear known to be present by recent MRI. Electronically Signed   By: Nelson Chimes M.D.   On: 09/25/2021 10:32   MR SHOULDER RIGHT WO CONTRAST  Result Date: 09/17/2021 CLINICAL DATA:  Chronic shoulder pain. Osteoarthritis suspected. Rotator cuff disorder suspected. Evaluate for metastases. EXAM: MRI OF THE RIGHT SHOULDER WITHOUT CONTRAST TECHNIQUE: Multiplanar, multisequence MR imaging of the shoulder was performed. No intravenous contrast was administered. COMPARISON:  Right shoulder radiographs 10/13/2018 FINDINGS: Despite efforts by the technologist and  patient, motion artifact is present on today's exam and could not be eliminated. This reduces exam sensitivity and specificity. Rotator cuff: There is a massive full-thickness tear of the entire AP dimension of the  supraspinatus tendon footprint with at least 3.1 cm tendon retraction to the humeral head apex. Within the limitations of patient motion artifact, there is likely partial-thickness anterior infraspinatus tendon tearing. Punctate fluid bright signal at the posterior infraspinatus musculotendinous junction and proximal tendon footprint, a small partial-thickness tear (sagittal series 7 images 15 through 19 and coronal series 5 image 7). Diffuse intermediate T2 signal suggesting moderate subscapularis tendinosis. The teres minor appears intact. Muscles: Within the limitations of motion artifact there appears to be moderate supraspinatus and anterior infraspinatus muscle atrophy and fatty infiltration. Biceps long head: The intra-articular long head of the biceps tendon is not well visualized. The tendon is seen within the distal aspect of the bicipital groove (axial images 21-23) but not the proximal aspect of the bicipital groove. Findings are suspicious for proximal tendon rupture with distal tendon retraction. Acromioclavicular Joint: There are mild-to-moderate degenerative changes of the acromioclavicular joint including joint space narrowing, clavicular head subchondral marrow edema, and peripheral osteophytosis. Type II acromion. Glenohumeral Joint: Moderate glenoid and humeral head cartilage thinning. Moderate anterior inferior greater than posteroinferior glenoid subchondral marrow edema and cystic change. Likely mild chronic superomedial humeral head cortical flattening/remodeling degenerative change. Labrum: Motion artifact limits evaluation. Likely attenuation and degenerative fraying of the superior glenoid labrum. Bones: Mild marrow edema within the greater tuberosity deep to the supraspinatus tendon insertion. There is tubular and serpiginous decreased T1 and heterogeneous T2 signal seen within the proximal diaphysis of the humerus. This corresponds to serpiginous sclerotic and lucent signal on prior  10/13/2018 radiographs. This appears to represent a chronic bone infarct. No cortical destruction or extraosseous soft tissue mass. Other: None. IMPRESSION: 1. Unfortunately, moderate to high-grade patient motion artifact technically limits this study. 2. Massive full-thickness tear of the entire AP dimension of the supraspinatus tendon footprint. Moderate supraspinatus muscle atrophy. 3. Likely anterior infraspinatus partial-thickness tendon tearing. Mild partial-thickness tear within the posterior infraspinatus musculotendinous junction and proximal tendon. 4. Moderate subscapularis tendinosis. 5. Mild-to-moderate degenerative changes of the acromioclavicular and glenohumeral joints. 6. 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 Electronically Signed   By: Yvonne Kendall M.D.   On: 09/17/2021 12:36   MR SHOULDER LEFT WO CONTRAST  Result Date: 09/17/2021 CLINICAL DATA:  Shoulder pain. Rotator cuff disorder suspected. Evaluate for metastases. EXAM: MRI OF THE LEFT SHOULDER WITHOUT CONTRAST TECHNIQUE: Multiplanar, multisequence MR imaging of the shoulder was performed. No intravenous contrast was administered. COMPARISON:  Left shoulder radiographs 09/16/2021 FINDINGS: Despite efforts by the technologist and patient, motion artifact is present on today's exam and could not be eliminated. This reduces exam sensitivity and specificity. Rotator cuff: There is horizontal linear fluid bright signal indicating a high-grade partial-thickness bursal sided tear of the supraspinatus tendon footprint measuring up to 11 mm in transverse dimension (coronal series 4 images thirteen through 15) and 16 mm in AP dimension (sagittal series 6, images 18 and 19). Only minimal approximate 5 mm tendon retraction of the bursal sided fibers. The infraspinatus is intact. Minimal superior subscapularis intermediate T2 signal tendinosis. The teres minor is intact. Muscles: No rotator cuff muscle atrophy, fatty infiltration,  or edema. Biceps long head: The intra-articular long head of the biceps tendon is intact. Acromioclavicular Joint: There are mild-to-moderate degenerative changes of the acromioclavicular joint including joint space narrowing, clavicular head  subchondral marrow edema, and peripheral osteophytosis. Type II acromion. Mild subacromial/subdeltoid bursitis. Glenohumeral Joint: Moderate thinning of the glenoid and humeral head cartilage. Labrum: Grossly intact, but evaluation is limited by lack of intraarticular fluid. Bones:  No acute fracture. Other: Prominent anterior shoulder blood vessels. Note is made of a stent overlying the left axilla on prior radiographs. IMPRESSION: 1. Horizontal linear high-grade partial-thickness bursal sided tear of the anterior supraspinatus tendon footprint measuring up to 16 mm in AP dimension. 2. Mild-to-moderate degenerative changes of the acromioclavicular joint. 3. Moderate thinning of the glenoid and humeral head cartilage. Electronically Signed   By: Yvonne Kendall M.D.   On: 09/17/2021 12:23   DG Shoulder Left  Result Date: 09/16/2021 CLINICAL DATA:  Left shoulder pain no known trauma. EXAM: LEFT SHOULDER - 2+ VIEW COMPARISON:  None Available. FINDINGS: There is no evidence of fracture or dislocation. Mild degenerative change of the glenohumeral and acromioclavicular joints. Mineralization of the supraspinatus tendon. Vascular stent projects over the left axilla. Vascular calcifications. IMPRESSION: 1. No acute fracture or dislocation. 2. Mild degenerative changes of the glenohumeral and acromioclavicular joints. 3. Calcific tendinosis of the supraspinatus tendon. Electronically Signed   By: Dahlia Bailiff M.D.   On: 09/16/2021 16:21   DG CHEST PORT 1 VIEW  Result Date: 09/15/2021 CLINICAL DATA:  Shortness of breath EXAM: PORTABLE CHEST 1 VIEW COMPARISON:  09/14/2021 FINDINGS: Stable cardiomegaly. Aortic atherosclerosis. Mild pulmonary vascular congestion. Mildly increased  perihilar and bibasilar interstitial markings bilaterally. No large pleural fluid collection. No pneumothorax. IMPRESSION: Findings suggestive of CHF with mild interstitial edema. Electronically Signed   By: Davina Poke D.O.   On: 09/15/2021 18:46   DG Pelvis Portable  Result Date: 09/15/2021 CLINICAL DATA:  Fall with hip pain. EXAM: PORTABLE PELVIS 1-2 VIEWS COMPARISON:  Pelvis radiograph 08/11/2021 FINDINGS: No evidence of acute fracture of the pelvis or hips. Femoral heads remain seated. The bones are subjectively under mineralized. Grossly intact pubic rami. Pubic symphysis and sacroiliac joints are congruent. Advanced vascular calcifications. IMPRESSION: No acute fracture of the pelvis or hips. Electronically Signed   By: Keith Rake M.D.   On: 09/15/2021 00:16   DG Chest Portable 1 View  Result Date: 09/14/2021 CLINICAL DATA:  Shortness of breath EXAM: PORTABLE CHEST 1 VIEW COMPARISON:  08/16/2021 FINDINGS: Transverse diameter of heart is increased. Central pulmonary vessels are prominent. Increased interstitial markings are seen in the parahilar regions and lower lung fields. There is hazy homogeneous opacity in the right parahilar region. Lateral CP angles are indistinct. There is no pneumothorax. There is vascular stent in the left axilla. IMPRESSION: Cardiomegaly. Central pulmonary vessels are prominent suggesting possible mild CHF. Increased interstitial markings are seen in the parahilar regions and lower lung fields suggesting mild interstitial edema or interstitial pneumonia. There is hazy homogeneous opacity in the right parahilar region which may suggest loculated effusion in the interlobar fissure or subsegmental atelectasis. Electronically Signed   By: Elmer Picker M.D.   On: 09/14/2021 12:52   IR Radiologist Eval & Mgmt  Result Date: 09/12/2021 Please refer to notes tab for details about interventional procedure. (Op Note)      The results of significant  diagnostics from this hospitalization (including imaging, microbiology, ancillary and laboratory) are listed below for reference.     Microbiology: No results found for this or any previous visit (from the past 240 hour(s)).   Labs:  CBC: Recent Labs  Lab 10/07/21 0244 10/07/21 1442 10/08/21 0424 10/08/21 1201 10/09/21 0201 10/10/21  0135  WBC 5.5  --  5.1 5.0 4.6 5.2  NEUTROABS 3.1  --   --   --   --   --   HGB 7.2* 8.6* 8.1* 8.1* 8.0* 8.8*  HCT 21.7* 25.0* 23.9* 24.3* 23.7* 25.6*  MCV 99.5  --  95.6 97.2 96.0 95.5  PLT 155  --  143* 147* 144* 141*   BMP &GFR Recent Labs  Lab 10/06/21 0447 10/06/21 0755 10/07/21 0244 10/08/21 0424 10/09/21 0201 10/10/21 0135  NA 139 140 140 135 132* 133*  K 5.8* 5.0 5.3* 4.3 3.4* 3.6  CL 96* 101 99 98 94* 93*  CO2 24  --  '22 24 29 26  '$ GLUCOSE 81 82 95 100* 101* 111*  BUN 52* 49* 57* 29* 15 12  CREATININE 7.00* 7.50* 7.86* 5.37* 3.78* 3.45*  CALCIUM 9.0  --  8.8* 8.5* 8.0* 8.3*  MG  --   --   --  1.6* 1.5* 1.7  PHOS  --   --  5.2* 4.5 3.6 2.5   Estimated Creatinine Clearance: 27.2 mL/min (A) (by C-G formula based on SCr of 3.45 mg/dL (H)). Liver & Pancreas: Recent Labs  Lab 10/05/21 0729 10/06/21 0447 10/07/21 0244 10/08/21 0424 10/09/21 0201 10/10/21 0135  AST 46* 48*  --   --   --   --   ALT 16 15  --   --   --   --   ALKPHOS 124 110  --   --   --   --   BILITOT 0.8 0.8  --   --   --   --   PROT 7.5 7.1  --   --   --   --   ALBUMIN 2.3* 2.3* 2.4* 2.5* 2.4* 2.5*   Recent Labs  Lab 10/09/21 0201  LIPASE 49   Recent Labs  Lab 10/09/21 0201  AMMONIA 38*   Diabetic: No results for input(s): "HGBA1C" in the last 72 hours. Recent Labs  Lab 10/06/21 0859 10/07/21 0829  GLUCAP 78 103*   Cardiac Enzymes: No results for input(s): "CKTOTAL", "CKMB", "CKMBINDEX", "TROPONINI" in the last 168 hours. No results for input(s): "PROBNP" in the last 8760 hours. Coagulation Profile: Recent Labs  Lab 10/05/21 0729  INR  1.1   Thyroid Function Tests: Recent Labs    10/09/21 0201  TSH 10.025*  FREET4 0.78   Lipid Profile: No results for input(s): "CHOL", "HDL", "LDLCALC", "TRIG", "CHOLHDL", "LDLDIRECT" in the last 72 hours. Anemia Panel: No results for input(s): "VITAMINB12", "FOLATE", "FERRITIN", "TIBC", "IRON", "RETICCTPCT" in the last 72 hours. Urine analysis:    Component Value Date/Time   COLORURINE YELLOW 07/31/2019 2128   APPEARANCEUR CLEAR 07/31/2019 2128   LABSPEC 1.009 07/31/2019 2128   PHURINE 5.0 07/31/2019 2128   GLUCOSEU NEGATIVE 07/31/2019 2128   HGBUR SMALL (A) 07/31/2019 2128   BILIRUBINUR NEGATIVE 07/31/2019 2128   KETONESUR NEGATIVE 07/31/2019 2128   PROTEINUR 100 (A) 09/03/2021 1150   UROBILINOGEN 0.2 12/31/2009 0746   NITRITE NEGATIVE 07/31/2019 2128   LEUKOCYTESUR NEGATIVE 07/31/2019 2128   Sepsis Labs: Invalid input(s): "PROCALCITONIN", "LACTICIDVEN"   SIGNED:  Mercy Riding, MD  [facility] 10/10/2021, 4:02 PM

## 2021-10-10 NOTE — TOC Transition Note (Addendum)
Transition of Care [facility]) - CM/SW Discharge Note   Patient Details  Name: Daniel [name] MRN: [id number] Date of Birth: 04-05-1961  [facility] CM/SW Contact:  Tom-Johnson, Renea Ee, RN Phone Number: 10/10/2021, 10:39 AM   Clinical Narrative:     Patient is scheduled for discharge today. Readmission Prevention assessment done. Home health PT/OT/RN/Aide/SW referral sent to [facility] per patient's request and [facility] voiced acceptance. BSC and Sliding Board recommended by PT/OT. CM called in order to [place] states patient recently received a BSC last month. [employer] will deliver Sliding Board to patient's residence. Outpatient Palliative referral called in to Cheri with [facility] and acceptance voiced. Patient states he drove himself to the hospital and will drive self himself home.  No further TOC needs noted.  Final next level of care: [place] Barriers to Discharge: Barriers Resolved   Patient Goals and CMS Choice Patient states their goals for this hospitalization and ongoing recovery are:: To return home CMS Medicare.gov Compare Post Acute Care list provided to:: Patient Choice offered to / list presented to : Patient  Discharge Placement                Patient to be transferred to facility by: Self      Discharge Plan and Services   Discharge Planning Services: CM Consult Post Acute Care Choice: IP Rehab          DME Arranged: 3-N-1, Other see comment (Sliding board) DME Agency: [employer]       HH Arranged: PT, OT, RN, Disease Management, Nurse's Aide, Social Work [employer] Agency: [facility] Date [place] Agency Contacted: 10/10/21 Time [place]: 1034 Representative spoke with at [place]: [place] ([place]) Interventions     Readmission Risk Interventions    02/27/2021   12:51 PM 07/07/2019    4:03 PM  Readmission Risk Prevention Plan  Transportation Screening Complete Complete   PCP or Specialist Appt within 3-5 Days  Complete  HRI or [place]  Complete  Social Work Consult for [place] Planning/Counseling  Complete  Palliative Care Screening  Not Applicable  Medication Review Press [employer]) Complete Complete  PCP or Specialist appointment within 3-5 days of discharge Complete   HRI or [place] Complete   SW Recovery Care/Counseling Consult Complete   [place] Not Applicable

## 2021-10-10 NOTE — Progress Notes (Signed)
[facility]) Hospital Liaison note:  Notified by [facility] of request for [place] services. Will continue to follow for disposition.  Please call with any outpatient palliative questions or concerns.  Thank you for the opportunity to participate in this patient's care.  Thank you, Lorelee Market, LPN [facility] Liaison [phone]

## 2021-10-10 NOTE — Progress Notes (Signed)
Physical Therapy Treatment & Discharge Patient Details Name: Daniel [name] MRN: [id number] DOB: 01-Oct-1960 Today's Date: 10/10/2021   History of Present Illness Pt is a 61 y/o M presenting to ED on 6/10 with dark stool and epistaxis; recent hospitalization from 6/3-6/4 for symptomatic anemia in the setting of recurrent GI bleed and multifocal PNA. PMH includes ESRD on HD MWF, recurrent GIB, duodenal ulcer, gastric ulcer, small bowel AVM, PAD, chronic pain syndrome, DM2, orthostatic hypotension, tobacco use disorder, anxiety, and L BKA.    PT Comments    Patient currently functioning at [place] level for transfers and standing with RW. Patient seems to be at baseline functioning. Able to demonstrate ability to transfer safely without difficulty. Discussed with patient about day to day tasks and techniques for completion to ensure patient is safe at discharge to prevent return to hospital. Patient states he has a friend that lives there that can assist him as needed. Patient has met all PT goals and is at baseline. Recommend HHPT for continued strengthening in preparation for prosthesis in July. PT will sign off.    Recommendations for follow up therapy are one component of a multi-disciplinary discharge planning process, led by the attending physician.  Recommendations may be updated based on patient status, additional functional criteria and insurance authorization.  Follow Up Recommendations  Home health PT     Assistance Recommended at Discharge Set up Supervision/Assistance  Patient can return home with the following Assistance with cooking/housework   Equipment Recommendations  Other (comment) (Amputee board (or sliding board) to keep L knee in extension at rest in wheelchair)    Recommendations for Other Services       Precautions / Restrictions Precautions Precautions: Fall Restrictions Weight Bearing Restrictions: Yes LLE Weight Bearing: Non weight bearing     Mobility   Bed Mobility               General bed mobility comments: Sitting EOB upon arrival    Transfers Overall transfer level: Modified independent Equipment used: Rolling Daniel [name] (2 wheels), None Transfers: Sit to/from Stand, Bed to chair/wheelchair/BSC             General transfer comment: able to perform sit to stand modI with use of RW. Completed bed<>chair transfer via squat pivot modI    Ambulation/Gait Ambulation/Gait assistance: Modified independent (Device/Increase time) Gait Distance (Feet): 6 Feet Assistive device: Rolling Daniel [name] (2 wheels) Gait Pattern/deviations:  (hop to)           Stairs             Wheelchair Mobility    Modified Rankin (Stroke Patients Only)       Balance Overall balance assessment: Mild deficits observed, not formally tested                                          Cognition Arousal/Alertness: Awake/alert Behavior During Therapy: WFL for tasks assessed/performed Overall Cognitive Status: No family/caregiver present to determine baseline cognitive functioning                                 General Comments: seems WFL and at baseline        Exercises      General Comments        Pertinent Vitals/Pain Pain Assessment Pain Assessment: No/denies pain  Home Living                          Prior Function            PT Goals (current goals can now be found in the care plan section) Acute Rehab PT Goals Patient Stated Goal: to go home PT Goal Formulation: With patient Time For Goal Achievement: 10/22/21 Potential to Achieve Goals: Good Progress towards PT goals: Goals met/education completed, patient discharged from PT    Frequency    Min 3X/week      PT Plan Discharge plan needs to be updated    Co-evaluation              AM-PAC PT "6 Clicks" Mobility   Outcome Measure  Help needed turning from your back to your side while in a flat bed without  using bedrails?: None Help needed moving from lying on your back to sitting on the side of a flat bed without using bedrails?: None Help needed moving to and from a bed to a chair (including a wheelchair)?: None Help needed standing up from a chair using your arms (e.g., wheelchair or bedside chair)?: None Help needed to walk in hospital room?: A Little Help needed climbing 3-5 steps with a railing? : Total 6 Click Score: 20    End of Session Equipment Utilized During Treatment: Gait belt Activity Tolerance: Patient tolerated treatment well Patient left: in bed;with call bell/phone within reach Nurse Communication: Mobility status PT Visit Diagnosis: Unsteadiness on feet (R26.81);Other abnormalities of gait and mobility (R26.89);Muscle weakness (generalized) (M62.81)     Time: 1779-3903 PT Time Calculation (min) (ACUTE ONLY): 23 min  Charges:  $Therapeutic Activity: 8-22 mins                     Daniel [name] A. Daniel [name] PT, Daniel [name] Acute Rehabilitation Services Office [phone]    Daniel [name] 10/10/2021, 11:00 AM

## 2021-10-10 NOTE — Progress Notes (Signed)
D/C order noted. Contacted [facility] SW to advise clinic of pt's d/c today and that pt will resume care tomorrow.   Aijah Lattner Renal Navigator [phone] 

## 2021-10-11 ENCOUNTER — Telehealth: Payer: Self-pay

## 2021-10-11 NOTE — Telephone Encounter (Signed)
Attempted to contact patient 3 times to schedule a Palliative Care consult appointment. Bad phone connection. Will try again later.

## 2021-10-11 NOTE — Telephone Encounter (Signed)
Transition Care Management Unsuccessful Follow-up Telephone Call  Date of discharge and from where:  [facility]  Attempts:  1st Attempt  Reason for unsuccessful TCM follow-up call:  Left voice message call back requested. Need to schedule a f/u appt

## 2021-10-14 ENCOUNTER — Telehealth: Payer: Self-pay

## 2021-10-14 NOTE — Telephone Encounter (Signed)
Transition Care Management Unsuccessful Follow-up Telephone Call  Date of discharge and from where:  10/10/2021, [facility]  Attempts:  2nd Attempt  Reason for unsuccessful TCM follow-up call:  Unable to leave message calls placed to # [phone]  and  [phone]- both numbers just ring, no option to leave a message

## 2021-10-15 ENCOUNTER — Telehealth (INDEPENDENT_AMBULATORY_CARE_PROVIDER_SITE_OTHER): Payer: Medicare Other | Admitting: Primary Care

## 2021-10-15 ENCOUNTER — Other Ambulatory Visit: Payer: Self-pay | Admitting: *Deleted

## 2021-10-15 ENCOUNTER — Ambulatory Visit: Payer: Self-pay | Admitting: *Deleted

## 2021-10-15 ENCOUNTER — Telehealth: Payer: Self-pay

## 2021-10-15 NOTE — Patient Outreach (Signed)
[facility]) Care Management Geriatric Nurse Practitioner Note   10/15/2021 Name:  Daniel [name] MRN:  [id number] DOB:  03/01/1961  Summary: Daniel [name] Home Visit today. Completed [employer] Application  Recommendations/Changes made from today's visit: Please answer all your calls. Attend appts. Call if you have a problem.  Subjective: Daniel [name] is an 61 y.o. year old male who is a primary patient of [name], NP. The care management team was consulted for assistance with care management and/or care coordination needs.    Geriatric Nurse Practitioner completed Home Visit today.    Objective: BP (!) 100/42 (BP Location: Right Arm, Patient Position: Sitting, Cuff Size: Normal)   Pulse 80   Resp 16   SpO2 94%   C/O lumbar back pain on the R today but this pain moves from the R to L. It has been going on for some time. He rates the pain 8/10. He has been taking oxycodone and apap 5/325 mg once a day. He says he was told not to take anything else OTC. He has one tablet left.  We discussed the need to be mindful of answering his phone to get information about appts and services. We talked about [facility] and the services they provide and NP advised that he will be getting a call from them. Pt is not able to grasp the severity of his multiple co-morbidities. We talked about doing everything HE can to help himself to prevent further demise.,  Outpatient Encounter Medications as of 10/15/2021  Medication Sig Note   amLODipine (NORVASC) 5 MG tablet Take 1 tablet (5 mg total) by mouth daily.    atorvastatin (LIPITOR) 40 MG tablet Take 1 tablet (40 mg total) by mouth daily.    Darbepoetin Alfa (ARANESP) 100 MCG/0.5ML SOSY injection Inject 0.5 mLs (100 mcg total) into the vein every 7 (seven) days for 8 doses. (Patient not taking: Reported on 10/15/2021)    gabapentin (NEURONTIN) 100 MG capsule Take 1 capsule (100 mg total) by mouth 3 (three)  times daily.    oxyCODONE-acetaminophen (PERCOCET) 5-325 MG tablet Take 1 tablet by mouth every 6 (six) hours as needed. (Patient taking differently: Take 1 tablet by mouth every 6 (six) hours as needed for moderate pain.) 10/15/2021: Has one left today 10/15/21   pantoprazole (PROTONIX) 40 MG tablet Take 1 tablet (40 mg total) by mouth 2 (two) times daily before a meal.    traZODone (DESYREL) 50 MG tablet Take 1 tablet (50 mg total) by mouth at bedtime as needed for sleep.    [DISCONTINUED] colchicine 0.6 MG tablet Take 0.5 tablets (0.3 mg total) by mouth 2 (two) times daily.    [DISCONTINUED] furosemide (LASIX) 40 MG tablet Take 1 tablet (40 mg total) by mouth daily.    No facility-administered encounter medications on file as of 10/15/2021.   Care Plan  Review of patient past medical history, allergies, medications, health status, including review of consultants reports, laboratory and other test data, was performed as part of comprehensive evaluation for care management services.   Care Plan : [facility] NP Plan of Care  Updates made by Daniel Lair, NP since 10/15/2021 12:00 AM     Problem: Needs handicapped accessible living quarters.   Priority: High  Onset Date: 08/29/2021     Goal: Patient will review and fill out applications for appropriate housing over the next 30 days. Extending end date and working of this goal to Pt will assist with proving information for  applications.   Start Date: 08/29/2021  Expected End Date: 11/01/2021  Recent Progress: Not on track  Priority: High  Note:   10/15/21 :  (Status: Goal Met.) Short Term Goal  Evaluation of current treatment plan related to [place] and patient's adherence to plan as established by provider NP visited with Daniel [name] in his boarding home. He was in bed but easily transferred to his W/C. Prior to this visit we had completed as much as we could over the phone and his sister Daniel [name] assisted. Filled in last  information required. NP to turn into the [facility] on [address] today.  Update 10/01/21:  (Status: Goal on track: NO.) Short Term Goal  Evaluation of current treatment plan related to [place] and patient's adherence to plan as established by provider Pt has not done any applications, He has been in the hospital again for GI bleed and anemia requiring transfusions. He is weak. He goes to dialysis 3 times a week and is now having to go to oncology due to his anemia and discovery of liver ca.      Pt's sister Daniel [name] called NP yesterday. Pt had not mentioned her as a contact. He always says no one wants to help him. Pt gave permission for NP to talk with his sister. She and NP had conversation today regarding his health status and his nnot trying to help himself find new housing. NP advised she is filling out a [facility] application and requests information for questions. Sister will assist with this and will go to several housing communities to apply in person on behalf of his brother. She works 12 hour shifts 2-2 and can help some before she goes to work.  Update 09/19/21:  (Status: Goal on track: NO.) Short Term Goal  Evaluation of current treatment plan related to [place] and patient's adherence to plan as established by provider Pt just discharged from the hospital for 4 day stay anemia requiring 3 pints of blood. 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 He goes to dialysis M-W-F. He has had numerous other MD appts. Talked with him today about his progress in seeking housing and he said he can't do that because he cannot get in and out of his car without assistance. He is a BKA and uses a W/C. NP requested that he call the housing areas that he would like to live in and make and appt to go fill out an application and view the apts. Suggested he could make several appts on the same day and enlist a family member or friend to go with him to assist. He was not happy with the suggestion but he said OK. He  says he is supposed to be out of his current housing situation on 09/28/21. He had known about this for some time now and had been provided the information a month ago for available housing options. NP will call LCSW on suggestions for housing that are likely to have openings for quick move in date.  Current Barriers:  Self motivation  RNCM Clinical Goal(s):  Patient will work with care team [facility] NP, [place] CSW, potential housing locations) to choose appropriate housing and complete applications as evidenced by pt report   through collaboration with Consulting civil engineer, provider, and care team.   Interventions: Inter-disciplinary care team collaboration (see longitudinal plan of care) Evaluation of current treatment plan related to  self management and patient's adherence to plan as established by provider  Patient Goals/Self-Care Activities:  Initiate process of finding new housing with resources given.      Problem: Possible new dx of liver and lung cancer   Priority: Medium  Onset Date: 08/29/2021     Long-Range Goal: Patient will attend oncology appts over the next 30 days. Extending goal for another 60 days and changed to long term goal. Extending to another 90 days.   Start Date: 08/29/2021  Expected End Date: 01/17/2022  This Visit's Progress: On track  Priority: Medium  Note:    Update 10/15/21:  (Status: Goal on Track (progressing): YES.) Long Term Goal  Evaluation of current treatment plan related to ATTENDANCE TO ONCOLOGY APPTS and patient's adherence to plan as established by provider Pt has labs done on 10/03/21 which indicated a serum creatitine of >8.0. Pt goes to dialysis M-W-F. Pt was admitted 2 days later for rectal bleeding he was treated and discharged after a 5 days start. Palliative Care consult was ordered for outpt. Today, he reports he has not heard from them. Advised to please answer all his calls as most are for his benefit, people trying to help him. Advised of appt with  [facility] on the 22nd for labs, to see [name] and to receive blood if he requires this.  Update 10/01/21:  (Status: Goal on Track (progressing): YES.) Long Term Goal  Evaluation of current treatment plan related to ATTENDING ONCOLOGY APPTS and patient's adherence to plan as established by provider Pt is attending these appts. He is needing frequent transfusions due to anemia related to blood loss and to monitor his newly dx liver ca. Praised for him making his appts. Discussed how important it is to have labs drawn to monitor his blood count because if it falls too low it could be life threatening.  Update 09/19/21:  (Status: Goal on track: NO.) Short Term Goal  Evaluation of current treatment plan related to [place] and patient's adherence to plan as established by provider Pt has had one chemotherapy infusion,however, he denies having had any. He did get a blood transfusion the same day so that may have been confusing for him. He does have an appt on 09/26/21, 10/03/21, 10/10/21 and an Interventional Radiaology appt on 10/24/21 for a procedure. NP to follow and remind pt's of his appts.  Current Barriers:  Knowledge Deficits related to plan of care for management of possible new ca.  Chronic Disease Management support and education needs related to possible ca No Advanced Directives in place  RNCM Clinical Goal(s):  Patient will verbalize understanding of plan for management of possible ca as evidenced by pt report. attend all scheduled medical appointments: Oncology as evidenced by chart review        through collaboration with RN Care manager, provider, and care team.   Interventions: Inter-disciplinary care team collaboration (see longitudinal plan of care) Evaluation of current treatment plan related to  self management and patient's adherence to plan as established by provider  Patient Goals/Self-Care Activities: Attend all scheduled provider appointments         Plan: Telephone follow up appointment with care management team member scheduled for:  1 week. Pt agrees to the plan of care.  [name], MSN, [facility] Gerontological Nurse Practitioner [facility] Care Management [phone]

## 2021-10-15 NOTE — Telephone Encounter (Signed)
Transition Care Management Follow-up Telephone Call Date of discharge and from where: 10/10/2021, [facility] How have you been since you were released from the hospital? He said he is weak but feeling okay.  Any questions or concerns? Yes- Bradd Canary, NP/THN was present with the patient at the time of this call.  She was with him to assist with completing an application for [employer].  He said [employer] came and switched out his wheelchair for  a smaller one.  He has not heard from [facility] yet.   Items Reviewed: Did the pt receive and understand the discharge instructions provided? Yes  Medications obtained and verified?  Bradd Canary, NP had not yet reviewed patient's medications.  Other? No  Any new allergies since your discharge? No  Dietary orders reviewed? No Do you have support at home?  He lives in a [place] and cares for himself.   Home Care and Equipment/Supplies: Were home health services ordered? yes If so, what is the name of the agency? [facility]  Has the agency set up a time to come to the patient's home? yes Were any new equipment or medical supplies ordered?  No What is the name of the medical supply agency? N/a Were you able to get the supplies/equipment? not applicable Do you have any questions related to the use of the equipment or supplies? No  Functional Questionnaire: (I = Independent and D = Dependent) ADLs: He uses  his wheelchair for mobility. He just received a sliding board in the mail but is not sure who sent it to him. Independent with personal care.  Attends HD[facility] M/W/F at [facility]   Follow up appointments reviewed:  PCP Hospital f/u appt confirmed? Yes  Scheduled to see [name], NP -  10/22/2021.  [facility] f/u appt confirmed? Yes  Scheduled to see oncology - 10/17/2021, IR- 10/24/2021.  Are transportation arrangements needed? No - he drives If their condition worsens, is  the pt aware to call PCP or go to the Emergency Dept.? Yes Was the patient provided with contact information for the PCP's office or ED? Yes Was to pt encouraged to call back with questions or concerns? Yes

## 2021-10-17 ENCOUNTER — Telehealth: Payer: Self-pay

## 2021-10-17 ENCOUNTER — Encounter: Payer: Self-pay | Admitting: Hematology

## 2021-10-17 ENCOUNTER — Inpatient Hospital Stay: Payer: Medicare Other

## 2021-10-17 ENCOUNTER — Other Ambulatory Visit: Payer: Self-pay

## 2021-10-17 ENCOUNTER — Inpatient Hospital Stay (HOSPITAL_BASED_OUTPATIENT_CLINIC_OR_DEPARTMENT_OTHER): Payer: Medicare Other | Admitting: Hematology

## 2021-10-17 VITALS — BP 136/77 | HR 84 | Temp 98.0°F | Resp 18

## 2021-10-17 VITALS — BP 135/78 | HR 81 | Temp 97.7°F | Resp 18

## 2021-10-17 DIAGNOSIS — D500 Iron deficiency anemia secondary to blood loss (chronic): Secondary | ICD-10-CM

## 2021-10-17 DIAGNOSIS — N186 End stage renal disease: Secondary | ICD-10-CM | POA: Diagnosis not present

## 2021-10-17 DIAGNOSIS — K922 Gastrointestinal hemorrhage, unspecified: Secondary | ICD-10-CM | POA: Diagnosis not present

## 2021-10-17 DIAGNOSIS — C221 Intrahepatic bile duct carcinoma: Secondary | ICD-10-CM | POA: Diagnosis present

## 2021-10-17 DIAGNOSIS — D509 Iron deficiency anemia, unspecified: Secondary | ICD-10-CM | POA: Diagnosis not present

## 2021-10-17 DIAGNOSIS — K31819 Angiodysplasia of stomach and duodenum without bleeding: Secondary | ICD-10-CM

## 2021-10-17 DIAGNOSIS — K921 Melena: Secondary | ICD-10-CM

## 2021-10-17 DIAGNOSIS — K7460 Unspecified cirrhosis of liver: Secondary | ICD-10-CM | POA: Diagnosis not present

## 2021-10-17 DIAGNOSIS — D631 Anemia in chronic kidney disease: Secondary | ICD-10-CM

## 2021-10-17 DIAGNOSIS — C220 Liver cell carcinoma: Secondary | ICD-10-CM

## 2021-10-17 DIAGNOSIS — Z992 Dependence on renal dialysis: Secondary | ICD-10-CM | POA: Diagnosis not present

## 2021-10-17 LAB — CBC WITH DIFFERENTIAL/PLATELET
Abs Immature Granulocytes: 0.03 10*3/uL (ref 0.00–0.07)
Basophils Absolute: 0 10*3/uL (ref 0.0–0.1)
Basophils Relative: 0 %
Eosinophils Absolute: 0 10*3/uL (ref 0.0–0.5)
Eosinophils Relative: 1 %
HCT: 25.1 % — ABNORMAL LOW (ref 39.0–52.0)
Hemoglobin: 8.5 g/dL — ABNORMAL LOW (ref 13.0–17.0)
Immature Granulocytes: 1 %
Lymphocytes Relative: 20 %
Lymphs Abs: 1.3 10*3/uL (ref 0.7–4.0)
MCH: 32.1 pg (ref 26.0–34.0)
MCHC: 33.9 g/dL (ref 30.0–36.0)
MCV: 94.7 fL (ref 80.0–100.0)
Monocytes Absolute: 0.8 10*3/uL (ref 0.1–1.0)
Monocytes Relative: 12 %
Neutro Abs: 4.3 10*3/uL (ref 1.7–7.7)
Neutrophils Relative %: 66 %
Platelets: 157 10*3/uL (ref 150–400)
RBC: 2.65 MIL/uL — ABNORMAL LOW (ref 4.22–5.81)
RDW: 19.1 % — ABNORMAL HIGH (ref 11.5–15.5)
WBC: 6.4 10*3/uL (ref 4.0–10.5)
nRBC: 0 % (ref 0.0–0.2)

## 2021-10-17 LAB — FERRITIN: Ferritin: 361 ng/mL — ABNORMAL HIGH (ref 24–336)

## 2021-10-17 MED ORDER — SODIUM CHLORIDE 0.9 % IV SOLN: Freq: Once | INTRAVENOUS | Status: AC

## 2021-10-17 MED ORDER — SODIUM CHLORIDE 0.9 % IV SOLN
510.0000 mg | Freq: Once | INTRAVENOUS | Status: AC
  Administered 2021-10-17: 510 mg via INTRAVENOUS
  Filled 2021-10-17: qty 510

## 2021-10-17 MED ORDER — LORATADINE 10 MG PO TABS: 10.0000 mg | ORAL_TABLET | Freq: Once | ORAL | Status: DC

## 2021-10-17 MED ORDER — TRAZODONE HCL 50 MG PO TABS
50.0000 mg | ORAL_TABLET | Freq: Every evening | ORAL | 0 refills | Status: DC | PRN
Start: 2021-10-17 — End: 2021-10-22

## 2021-10-17 NOTE — Progress Notes (Signed)
[place]   [phone] [phone]   Clinic Follow up Note   Patient Care Team: [name], NP as PCP - General (Internal Medicine) Center, [facility], NP as [place] [facility]  Date of Service:  10/17/2021  CHIEF COMPLAINT: f/u of [place], anemia  CURRENT THERAPY:  PENDING Y90 11/07/21  ASSESSMENT & PLAN:  Daniel [name] is a 61 y.o. male with   1. Hepatocellular Carcinoma, cT2N0M0, stage II -non-hypermetabolic liver lesion incidentally seen on PET on 07/23/21 for lung nodule. No metabolic adenopathy. -presented to ED on 08/05/21 with recurrent tarry black stool and severe anemia, subsequently admitted. CT angio AP showed a large 5CM central liver lesion, further evaluated with MRI on 08/07/21 showing: 5 cm mass centered in segment 4A; adjacent 2 cm mass in right hepatic lobe (indeterminate); no adenopathy or distant metastasis.  The imaging finding was consistent with [place], liver biopsy not felt to be necessary for diagnosis of [place]. -baseline AFP on 08/07/21 was elevated at 13.9. -he has been in and out of the hospital since then due to anemia and GI bleed.  -he received one dose of sandostatin on 10/08/21 -he is scheduled for Y90 planning on 6/29 and procedure on 11/07/21. I have talked to Dr. Jeronimo Norma and we decided to hold bevacizumab for now  -labs reviewed, hgb is overall stable at 8.5 today. Will proceed with IV iron today. Will hold on any cancer treatment in light of upcoming Y90.   2. 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 -has required numerous hospital admission and blood transfusions. -he received one dose beva on 09/05/21 -last IV Feraheme on 10/08/21, plan for another dose today.   3. Liver Cirrhosis from untreated hepatitis C -history of alcohol abuse, he has cut down now  -chart review shows initial HCV positive in  05/2017, Hep B positive in 07/2019.   4. End-stage Renal Disease -on hemodialysis M/W/F     PLAN: -proceed with IV Feraheme today -hold beva due to his coming Y90  -lab every 2 weeks x4 -f/u in 8 weeks   No problem-specific Assessment & Plan notes found for this encounter.   SUMMARY OF ONCOLOGIC HISTORY: Oncology History  Hepatocellular carcinoma ([place])  02/07/2021 Imaging   CLINICAL DATA:  Abdominal distension   EXAM: CT ABDOMEN AND PELVIS WITHOUT CONTRAST  IMPRESSION: Cholelithiasis with possible mild gallbladder wall thickening. Correlate for symptoms of right upper quadrant pain and consider further evaluation with gallbladder ultrasound.   Otherwise, no acute findings in the abdomen or pelvis.   Aortic aneurysm NOS (ICD10-I71.9).   02/07/2021 Imaging   CLINICAL DATA:  Cholelithiasis. Question cholecystitis. Abdominal distension.   EXAM: ULTRASOUND ABDOMEN LIMITED RIGHT UPPER QUADRANT  IMPRESSION: 1. Cholelithiasis with associated nonspecific gallbladder wall thickening. No pericholecystic fluid or sonographic Percell Miller sign reported. Gallbladder wall thickening can be seen in the setting of chronic liver disease. Correlate with clinical exam for acute cholecystitis. 2. Limited ultrasound to penetration of sonographic waves.   03/13/2021 Imaging   CLINICAL DATA:  61 year old male with generalized abdominal pain and bright red blood in stool x3 days.   EXAM: CT ABDOMEN AND PELVIS WITHOUT CONTRAST  IMPRESSION: 1. Chronic cholelithiasis, with indistinct appearance of the gallbladder wall similar to the CT last month. However, relatively contracted state of the gallbladder argues against Acute Cholecystitis. But if there is right upper quadrant pain recommend Right Upper Quadrant Ultrasound  may be valuable.   2. No other acute or inflammatory process identified in the non-contrast abdomen or pelvis.   3. Diffuse calcified atherosclerosis, Aortic  atherosclerosis (ICD10-I70.0). Left nephrolithiasis. Mild large bowel diverticulosis.   07/23/2021 PET scan   IMPRESSION: 1. The ground-glass nodule in the RIGHT lower lobe is not well demonstrated on the nondiagnostic CT portion the PET-CT scan. No hypermetabolic lesion present on the PET data set within the RIGHT lower lobe. 2. Bands of intensely hypermetabolic peripheral atelectasis in the medial RIGHT lower lobe and lateral LEFT lower lobe/lingula. Favor benign benign atelectasis or other inflammatory process. Recommend follow-up CT (6 - 8 weeks) to demonstrate resolution. If pattern persists or increased tissue sampling may be warranted. 3. No metastatic adenopathy or distant metastatic disease. 4. Incidental findings of large central non hypermetabolic lesion in the liver. Recommend MRI of the liver with contrast for further characterization.   08/05/2021 Imaging   CLINICAL DATA:  GI bleed, melena, history of duodenal ulcer, anemia and end-stage renal disease.   EXAM: CT ANGIOGRAPHY ABDOMEN AND PELVIS WITH CONTRAST AND WITHOUT CONTRAST  IMPRESSION: 1. No active bleeding is identified into the gastrointestinal tract during arterial and venous phases of CT imaging. 2. Irregular outpouching along the medial aspect of the proximal duodenum near the expected position of the duodenal bulb or post bulbar duodenum. This is highly suspicious for a focal duodenal ulcer. There is some adjacent edema in the retroperitoneal fat. This may be the source of GI bleeding clinically. 3. Detection of a rounded 4.4 cm central hepatic mass near the juncture of segments IVb and VIII as well as a possible adjacent 2.2 cm mass in segment VIII. Main differential considerations are some type of well differentiated carcinoma such as hepatocellular carcinoma or neuroendocrine carcinoma. Metastatic disease or cholangiocarcinoma is not excluded. Further evaluation with MRI of the abdomen with and without  contrast is warranted.   08/06/2021 Initial Diagnosis   Hepatocellular carcinoma ([place])   08/07/2021 Imaging   CLINICAL DATA:  Liver mass, history of hepatitis-C   EXAM: MRI ABDOMEN WITHOUT AND WITH CONTRAST (INCLUDING MRCP)  IMPRESSION: 1. Hepatic cirrhosis. 5 cm mass centered in segment 4A is LI-RADS 5, consistent with hepatocellular carcinoma. 2. Adjacent 2 cm mass in the central right hepatic lobe as described, LI-RADS 3. Consider follow-up in 3-6 months. 3. Mild splenomegaly likely secondary to portal hypertension. 4. Trace ascites. 5. Small bilateral pleural effusions. Abdominal wall subcutaneous edema noted.   08/07/2021 Cancer Staging   Staging form: Liver, AJCC 8th Edition - Clinical stage from 08/07/2021: Stage II (cT2, cN0, cM0) - Signed by Truitt Merle, MD on 08/30/2021 Stage prefix: Initial diagnosis      INTERVAL HISTORY:  [name] is here for a follow up of [place] and anemia. He was last seen by me on 10/08/21 while he was in the hospital. He presents to the clinic alone; he reports he drove himself. He reports he is doing better since discharge. He reports he is having diarrhea, twice a day. He also reports a cough, no phlegm; he has already completed a course of antibiotics. He reports trouble sleeping. He notes he previously took something but doesn't remember what. He also reports back pain for the last month. He denies any known cause. He explains the pain goes from one side to the other.   All other systems were reviewed with the patient and are negative.  MEDICAL HISTORY:  Past Medical History:  Diagnosis Date   Anemia  Diabetes mellitus without complication [facility])    patient denies   Dialysis patient [facility])    End stage chronic kidney disease ([place])    Hypertension    ICH (intracerebral hemorrhage) ([place]) 05/20/2017   PAD (peripheral artery disease) ([facility])    Shoulder pain, left 06/28/2013    SURGICAL HISTORY: Past Surgical History:  Procedure  Laterality Date   A/V FISTULAGRAM N/A 08/15/2020   Procedure: A/V FISTULAGRAM - Left Upper;  Surgeon: Cherre Robins, MD;  Location: [place] [facility];  Service: Cardiovascular;  Laterality: N/A;   ABDOMINAL AORTOGRAM W/LOWER EXTREMITY N/A 04/08/2021   Procedure: ABDOMINAL AORTOGRAM W/LOWER EXTREMITY;  Surgeon: Waynetta Sandy, MD;  Location: [place] [facility];  Service: Cardiovascular;  Laterality: N/A;   AMPUTATION Left 04/16/2021   Procedure: LEFT BELOW KNEE AMPUTATION;  Surgeon: Angelia Mould, MD;  Location: [place];  Service: Vascular;  Laterality: Left;   AMPUTATION Left 06/17/2021   Procedure: REVISION AMPUTATION BELOW KNEE;  Surgeon: Broadus John, MD;  Location: [place];  Service: Vascular;  Laterality: Left;   APPENDECTOMY     APPLICATION OF WOUND VAC Left 06/17/2021   Procedure: APPLICATION OF WOUND VAC;  Surgeon: Broadus John, MD;  Location: [place];  Service: Vascular;  Laterality: Left;   AV FISTULA PLACEMENT Left 08/03/2019   Procedure: LEFT ARM ARTERIOVENOUS (AV) CEPHALIC  FISTULA CREATION;  Surgeon: Waynetta Sandy, MD;  Location: [place];  Service: Vascular;  Laterality: Left;   BIOPSY  06/30/2019   Procedure: BIOPSY;  Surgeon: Ronald Lobo, MD;  Location: [facility] ENDOSCOPY;  Service: Endoscopy;;   BIOPSY  08/02/2019   Procedure: BIOPSY;  Surgeon: Yetta Flock, MD;  Location: [facility] ENDOSCOPY;  Service: Gastroenterology;;   BIOPSY  02/08/2021   Procedure: BIOPSY;  Surgeon: Sharyn Creamer, MD;  Location: [facility] ENDOSCOPY;  Service: Gastroenterology;;   BIOPSY  02/24/2021   Procedure: BIOPSY;  Surgeon: [name], MD;  Location: [facility] ENDOSCOPY;  Service: Gastroenterology;;   BIOPSY  03/26/2021   Procedure: BIOPSY;  Surgeon: Lavena Bullion, DO;  Location: [place];  Service: Gastroenterology;;   BIOPSY  08/06/2021   Procedure: BIOPSY;  Surgeon: [name], MD;  Location: [place];  Service: Gastroenterology;;   BIOPSY  09/15/2021    Procedure: BIOPSY;  Surgeon: Sharyn Creamer, MD;  Location: [place];  Service: Gastroenterology;;   COLONOSCOPY  01/23/2012   Procedure: COLONOSCOPY;  Surgeon: Danie Binder, MD;  [facility];  Service: Endoscopy;  Laterality: N/A;  11:10 AM   COLONOSCOPY WITH PROPOFOL N/A 06/30/2019   Procedure: COLONOSCOPY WITH PROPOFOL;  Surgeon: Ronald Lobo, MD;  Location: [place];  Service: Endoscopy;  Laterality: N/A;   ENTEROSCOPY N/A 08/02/2019   Procedure: ENTEROSCOPY;  Surgeon: Yetta Flock, MD;  Location: [facility] ENDOSCOPY;  Service: Gastroenterology;  Laterality: N/A;   ENTEROSCOPY N/A 02/24/2021   Procedure: ENTEROSCOPY;  Surgeon: [name], MD;  Location: [facility];  Service: Gastroenterology;  Laterality: N/A;   ENTEROSCOPY N/A 03/26/2021   Procedure: ENTEROSCOPY;  Surgeon: Lavena Bullion, DO;  Location: [place];  Service: Gastroenterology;  Laterality: N/A;   ENTEROSCOPY N/A 07/05/2021   Procedure: ENTEROSCOPY;  Surgeon: Carol Ada, MD;  Location: [place];  Service: Gastroenterology;  Laterality: N/A;   ENTEROSCOPY N/A 08/06/2021   Procedure: ENTEROSCOPY;  Surgeon: [name], MD;  Location: [facility] ENDOSCOPY;  Service: Gastroenterology;  Laterality: N/A;   ENTEROSCOPY N/A 08/24/2021   Procedure: ENTEROSCOPY;  Surgeon:  Ladene Artist, MD;  Location: [facility] ENDOSCOPY;  Service: Gastroenterology;  Laterality: N/A;   ENTEROSCOPY N/A 09/15/2021   Procedure: ENTEROSCOPY;  Surgeon: Sharyn Creamer, MD;  Location: [facility] ENDOSCOPY;  Service: Gastroenterology;  Laterality: N/A;   ENTEROSCOPY N/A 10/06/2021   Procedure: ENTEROSCOPY;  Surgeon: [name], MD;  Location: [place] ENDOSCOPY;  Service: Gastroenterology;  Laterality: N/A;   ESOPHAGOGASTRODUODENOSCOPY N/A 08/10/2020   Procedure: ESOPHAGOGASTRODUODENOSCOPY (EGD);  Surgeon: Jerene Bears, MD;  Location: [facility] ENDOSCOPY;  Service: Gastroenterology;  Laterality: N/A;   ESOPHAGOGASTRODUODENOSCOPY  (EGD) WITH PROPOFOL N/A 06/30/2019   Procedure: ESOPHAGOGASTRODUODENOSCOPY (EGD) WITH PROPOFOL;  Surgeon: Ronald Lobo, MD;  Location: [place];  Service: Endoscopy;  Laterality: N/A;   ESOPHAGOGASTRODUODENOSCOPY (EGD) WITH PROPOFOL N/A 01/12/2021   Procedure: ESOPHAGOGASTRODUODENOSCOPY (EGD) WITH PROPOFOL;  Surgeon: Sharyn Creamer, MD;  Location: [place];  Service: Gastroenterology;  Laterality: N/A;   ESOPHAGOGASTRODUODENOSCOPY (EGD) WITH PROPOFOL N/A 02/08/2021   Procedure: ESOPHAGOGASTRODUODENOSCOPY (EGD) WITH PROPOFOL;  Surgeon: Sharyn Creamer, MD;  Location: [place];  Service: Gastroenterology;  Laterality: N/A;   FISTULA SUPERFICIALIZATION Left 10/17/2019   Procedure: LEFT UPPER EXTREMITY FISTULA REVISION, SIDE BRANCH LIGATION,  AND SUPERFICIALIZATION;  Surgeon: Marty Heck, MD;  Location: [place];  Service: Vascular;  Laterality: Left;   FISTULA SUPERFICIALIZATION Left 86/57/8469   Procedure: PLICATION OF ANEURYSM LEFT ARTERIOVENOUS FISTULA;  Surgeon: Angelia Mould, MD;  Location: [place];  Service: Vascular;  Laterality: Left;   FLEXIBLE SIGMOIDOSCOPY N/A 07/05/2021   Procedure: FLEXIBLE SIGMOIDOSCOPY;  Surgeon: Carol Ada, MD;  Location: [place];  Service: Gastroenterology;  Laterality: N/A;   GIVENS CAPSULE STUDY N/A 06/30/2019   Procedure: GIVENS CAPSULE STUDY;  Surgeon: Ronald Lobo, MD;  Location: [place];  Service: Endoscopy;  Laterality: N/A;   GIVENS CAPSULE STUDY N/A 08/25/2021   Procedure: GIVENS CAPSULE STUDY;  Surgeon: Ladene Artist, MD;  Location: [place];  Service: Gastroenterology;  Laterality: N/A;   GIVENS CAPSULE STUDY N/A 10/06/2021   Procedure: GIVENS CAPSULE STUDY;  Surgeon: [name], MD;  Location: [place];  Service: Gastroenterology;  Laterality: N/A;   HEMOSTASIS CLIP PLACEMENT  08/10/2020   Procedure: HEMOSTASIS CLIP PLACEMENT;  Surgeon: Jerene Bears, MD;  Location: [place];  Service:  Gastroenterology;;   HEMOSTASIS CLIP PLACEMENT  01/12/2021   Procedure: HEMOSTASIS CLIP PLACEMENT;  Surgeon: Sharyn Creamer, MD;  Location: [place];  Service: Gastroenterology;;   HEMOSTASIS CONTROL  08/02/2019   Procedure: HEMOSTASIS CONTROL;  Surgeon: Yetta Flock, MD;  Location: [place];  Service: Gastroenterology;;   HOT HEMOSTASIS  02/24/2021   Procedure: HOT HEMOSTASIS (ARGON PLASMA COAGULATION/BICAP);  Surgeon: [name], MD;  Location: [facility] ENDOSCOPY;  Service: Gastroenterology;;   HOT HEMOSTASIS N/A 03/26/2021   Procedure: HOT HEMOSTASIS (ARGON PLASMA COAGULATION/BICAP);  Surgeon: Lavena Bullion, DO;  Location: [facility] ENDOSCOPY;  Service: Gastroenterology;  Laterality: N/A;   HOT HEMOSTASIS N/A 07/05/2021   Procedure: HOT HEMOSTASIS (ARGON PLASMA COAGULATION/BICAP);  Surgeon: Carol Ada, MD;  Location: [place];  Service: Gastroenterology;  Laterality: N/A;   HOT HEMOSTASIS N/A 09/15/2021   Procedure: HOT HEMOSTASIS (ARGON PLASMA COAGULATION/BICAP);  Surgeon: Sharyn Creamer, MD;  Location: [place];  Service: Gastroenterology;  Laterality: N/A;   HOT HEMOSTASIS N/A 10/06/2021   Procedure: HOT HEMOSTASIS (ARGON PLASMA COAGULATION/BICAP);  Surgeon: [name], MD;  Location: [place];  Service: Gastroenterology;  Laterality: N/A;   INCISION AND DRAINAGE ABSCESS N/A 06/29/2016   Procedure: INCISION AND DRAINAGE ABDOMINAL  WALL ABSCESS;  Surgeon: Alphonsa Overall, MD;  [facility]: [facility];  Service: General;  Laterality: N/A;   INSERTION OF DIALYSIS CATHETER Right 08/03/2019   Procedure: INSERTION OF DIALYSIS CATHETER;  Surgeon: Waynetta Sandy, MD;  Location: [place];  Service: Vascular;  Laterality: Right;   INSERTION OF DIALYSIS CATHETER Right 10/22/2019   Procedure: INSERTION OF 23CM TUNNELED DIALYSIS CATHETER RIGHT INTERNAL JUGULAR;  Surgeon: Angelia Mould, MD;  Location: [place];  Service: Vascular;  Laterality: Right;   INSERTION OF  DIALYSIS CATHETER Right 08/12/2020   Procedure: INSERTION OF Right internal Jugular TUNNELED  DIALYSIS CATHETER.;  Surgeon: Waynetta Sandy, MD;  Location: [place];  Service: Vascular;  Laterality: Right;   INSERTION OF DIALYSIS CATHETER N/A 04/19/2021   Procedure: INSERTION OF TUNNELED DIALYSIS CATHETER;  Surgeon: Angelia Mould, MD;  Location: [facility] OR;  Service: Vascular;  Laterality: N/A;   IR RADIOLOGIST EVAL & MGMT  09/12/2021   Left heel surgery     LOWER EXTREMITY ANGIOGRAPHY N/A 07/08/2021   Procedure: Lower Extremity Angiography;  Surgeon: Cherre Robins, MD;  Location: [place] [facility];  Service: Cardiovascular;  Laterality: N/A;   PENILE BIOPSY N/A 03/26/2020   Procedure: PENILE ULCER DEBRIDEMENT;  Surgeon: Remi Haggard, MD;  Location: [facility];  Service: Urology;  Laterality: N/A;  30 MINS   PERIPHERAL VASCULAR INTERVENTION Right 07/08/2021   Procedure: PERIPHERAL VASCULAR INTERVENTION;  Surgeon: Cherre Robins, MD;  Location: [place] [facility];  Service: Cardiovascular;  Laterality: Right;   SCLEROTHERAPY  01/12/2021   Procedure: SCLEROTHERAPY;  Surgeon: Sharyn Creamer, MD;  Location: [facility] ENDOSCOPY;  Service: Gastroenterology;;   Clide Deutscher  02/24/2021   Procedure: Clide Deutscher;  Surgeon: [name], MD;  Location: [place];  Service: Gastroenterology;;   SUBMUCOSAL TATTOO INJECTION  08/24/2021   Procedure: SUBMUCOSAL TATTOO INJECTION;  Surgeon: Ladene Artist, MD;  Location: [place];  Service: Gastroenterology;;   SUBMUCOSAL TATTOO INJECTION  09/15/2021   Procedure: SUBMUCOSAL TATTOO INJECTION;  Surgeon: Sharyn Creamer, MD;  Location: [facility] ENDOSCOPY;  Service: Gastroenterology;;    I have reviewed the social history and family history with the patient and they are unchanged from previous note.  ALLERGIES:  is allergic to seroquel [quetiapine] and dilaudid [hydromorphone hcl].  MEDICATIONS:  Current Outpatient Medications  Medication Sig  Dispense Refill   amLODipine (NORVASC) 5 MG tablet Take 1 tablet (5 mg total) by mouth daily. 30 tablet 2   atorvastatin (LIPITOR) 40 MG tablet Take 1 tablet (40 mg total) by mouth daily. 30 tablet 2   Darbepoetin Alfa (ARANESP) 100 MCG/0.5ML SOSY injection Inject 0.5 mLs (100 mcg total) into the vein every 7 (seven) days for 8 doses. (Patient not taking: Reported on 10/15/2021) 4 mL 0   gabapentin (NEURONTIN) 100 MG capsule Take 1 capsule (100 mg total) by mouth 3 (three) times daily. 90 capsule 2   oxyCODONE-acetaminophen (PERCOCET) 5-325 MG tablet Take 1 tablet by mouth every 6 (six) hours as needed. (Patient taking differently: Take 1 tablet by mouth every 6 (six) hours as needed for moderate pain.) 20 tablet 0   pantoprazole (PROTONIX) 40 MG tablet Take 1 tablet (40 mg total) by mouth 2 (two) times daily before a meal. 60 tablet 2   traZODone (DESYREL) 50 MG tablet Take 1 tablet (50 mg total) by mouth at bedtime as needed for sleep. 30 tablet 0   No current facility-administered medications for this visit.    PHYSICAL EXAMINATION: ECOG  PERFORMANCE STATUS: 2 - Symptomatic, <50% confined to bed  There were no vitals filed for this visit. Wt Readings from Last 3 Encounters:  10/10/21 222 lb 0.1 oz (100.7 kg)  09/28/21 244 lb 7.8 oz (110.9 kg)  09/26/21 244 lb 9.6 oz (110.9 kg)     GENERAL:alert, no distress and comfortable SKIN: skin color normal, no rashes or significant lesions EYES: normal, Conjunctiva are pink and non-injected, sclera clear  NEURO: alert & oriented x 3 with fluent speech  LABORATORY DATA:  I have reviewed the data as listed    Latest Ref Rng & Units 10/17/2021   11:28 AM 10/10/2021    1:35 AM 10/09/2021    2:01 AM  CBC  WBC 4.0 - 10.5 K/uL 6.4  5.2  4.6   Hemoglobin 13.0 - 17.0 g/dL 8.5  8.8  8.0   Hematocrit 39.0 - 52.0 % 25.1  25.6  23.7   Platelets 150 - 400 K/uL 157  141  144         Latest Ref Rng & Units 10/10/2021    1:35 AM 10/09/2021    2:01 AM  10/08/2021    4:24 AM  CMP  Glucose 70 - 99 mg/dL 111  101  100   BUN 8 - 23 mg/dL '12  15  29   '$ Creatinine 0.61 - 1.24 mg/dL 3.45  3.78  5.37   Sodium 135 - 145 mmol/L 133  132  135   Potassium 3.5 - 5.1 mmol/L 3.6  3.4  4.3   Chloride 98 - 111 mmol/L 93  94  98   CO2 22 - 32 mmol/L '26  29  24   '$ Calcium 8.9 - 10.3 mg/dL 8.3  8.0  8.5       RADIOGRAPHIC STUDIES: I have personally reviewed the radiological images as listed and agreed with the findings in the report. No results found.    No orders of the defined types were placed in this encounter.  All questions were answered. The patient knows to call the clinic with any problems, questions or concerns. No barriers to learning was detected. The total time spent in the appointment was 30 minutes.     Aurea Graff 10/17/2021   I, Wilburn Mylar, am acting as scribe for Truitt Merle, MD.   I have reviewed the above documentation for accuracy and completeness, and I agree with the above.

## 2021-10-17 NOTE — Progress Notes (Signed)
Patient refusing pre-meds for iron infusion. Pt also refusing to remain after infusion for post-observation. Informed patient of the reason for pre-meds and post-observation.Pt states understanding. MD aware. VSS and pt discharged to lobby in stable condition.

## 2021-10-17 NOTE — Telephone Encounter (Signed)
Spoke with patient regarding Palliative Care services. He declined services. Will cancel referral and notify referring provider.

## 2021-10-21 ENCOUNTER — Other Ambulatory Visit: Payer: Self-pay | Admitting: *Deleted

## 2021-10-22 ENCOUNTER — Ambulatory Visit (INDEPENDENT_AMBULATORY_CARE_PROVIDER_SITE_OTHER): Payer: Medicare Other | Admitting: Primary Care

## 2021-10-22 ENCOUNTER — Telehealth: Payer: Self-pay

## 2021-10-22 ENCOUNTER — Encounter (INDEPENDENT_AMBULATORY_CARE_PROVIDER_SITE_OTHER): Payer: Self-pay | Admitting: Primary Care

## 2021-10-22 ENCOUNTER — Encounter: Payer: Self-pay | Admitting: Hematology

## 2021-10-22 ENCOUNTER — Telehealth (INDEPENDENT_AMBULATORY_CARE_PROVIDER_SITE_OTHER): Payer: Self-pay | Admitting: Primary Care

## 2021-10-22 ENCOUNTER — Other Ambulatory Visit: Payer: Self-pay | Admitting: *Deleted

## 2021-10-22 VITALS — BP 153/90 | HR 84 | Temp 98.4°F | Ht 71.0 in

## 2021-10-22 DIAGNOSIS — G4700 Insomnia, unspecified: Secondary | ICD-10-CM

## 2021-10-22 DIAGNOSIS — R197 Diarrhea, unspecified: Secondary | ICD-10-CM

## 2021-10-22 DIAGNOSIS — G894 Chronic pain syndrome: Secondary | ICD-10-CM | POA: Diagnosis not present

## 2021-10-22 MED ORDER — TRAZODONE HCL 50 MG PO TABS: 50.0000 mg | ORAL_TABLET | Freq: Every evening | ORAL | 0 refills | Status: DC | PRN

## 2021-10-22 MED ORDER — LOPERAMIDE HCL 2 MG PO TABS
2.0000 mg | ORAL_TABLET | Freq: Four times a day (QID) | ORAL | 0 refills | Status: DC | PRN
Start: 2021-10-22 — End: 2021-11-15

## 2021-10-22 NOTE — Progress Notes (Signed)
Daniel [name], is a 61 y.o. male  ZOX:[id number]  [id number]:[id number]  DOB - May 11, 1960  Chief Complaint  Patient presents with   Hospitalization Follow-up    Rectal bleeding/H pylori        Subjective:   Daniel [name] is a 61 y.o. male here today for a hospital follow up visit for rectal bleeding. Followed by oncology hepatocellular carcinoma . Patient has No headache, No chest pain, No abdominal pain - No Nausea, No new weakness tingling or numbness, No Cough - shortness of breath  No problems updated.  Allergies  Allergen Reactions   Seroquel [Quetiapine] Other (See Comments)    Tardive kinesia/dystonia   Dilaudid [Hydromorphone Hcl] Itching and Other (See Comments)    Pt reports itchiness after IM injection     Past Medical History:  Diagnosis Date   Anemia    Diabetes mellitus without complication ([place])    patient denies   Dialysis patient [facility])    End stage chronic kidney disease ([place])    Hypertension    ICH (intracerebral hemorrhage) ([place]) 05/20/2017   PAD (peripheral artery disease) ([facility])    Shoulder pain, left 06/28/2013    Current Outpatient Medications on File Prior to Visit  Medication Sig Dispense Refill   amLODipine (NORVASC) 5 MG tablet Take 1 tablet (5 mg total) by mouth daily. 30 tablet 2   atorvastatin (LIPITOR) 40 MG tablet Take 1 tablet (40 mg total) by mouth daily. 30 tablet 2   gabapentin (NEURONTIN) 100 MG capsule Take 1 capsule (100 mg total) by mouth 3 (three) times daily. 90 capsule 2   oxyCODONE-acetaminophen (PERCOCET) 5-325 MG tablet Take 1 tablet by mouth every 6 (six) hours as needed. (Patient taking differently: Take 1 tablet by mouth every 6 (six) hours as needed for moderate pain.) 20 tablet 0   pantoprazole (PROTONIX) 40 MG tablet Take 1 tablet (40 mg total) by mouth 2 (two) times daily before a meal. 60 tablet 2   Darbepoetin Alfa (ARANESP) 100 MCG/0.5ML SOSY injection Inject 0.5 mLs (100 mcg total)  into the vein every 7 (seven) days for 8 doses. 4 mL 0   [DISCONTINUED] colchicine 0.6 MG tablet Take 0.5 tablets (0.3 mg total) by mouth 2 (two) times daily. 14 tablet 0   [DISCONTINUED] furosemide (LASIX) 40 MG tablet Take 1 tablet (40 mg total) by mouth daily. 30 tablet 0   No current facility-administered medications on file prior to visit.    Objective:   Vitals:   10/22/21 1026  BP: (!) 153/90  Pulse: 84  Temp: 98.4 F (36.9 C)  TempSrc: Oral  SpO2: 97%  Height: '5\' 11"'$  (1.803 m)    Exam General appearance : Awake, alert, not in any distress. Speech Clear. Not toxic looking HEENT: Atraumatic and Normocephalic, pupils equally reactive to light and accomodation Neck: Supple, no JVD. No cervical lymphadenopathy.  Chest: Good air entry bilaterally, no added sounds  CVS: S1 S2 regular, no murmurs.  Abdomen: Bowel sounds present, Non tender and not distended with no gaurding, rigidity or rebound. Extremities: B/L Lower Ext shows no edema, both legs are warm to touch Neurology: Awake alert, and oriented X 3, CN II-XII intact, Non focal Skin: No Rash  Data Review Lab Results  Component Value Date   HGBA1C 5.2 09/03/2021   HGBA1C 5.4 09/26/2020   HGBA1C 6.4 (H) 03/26/2020   27 th  Assessment & Plan   1. Insomnia, unspecified type  - traZODone (DESYREL) 50 MG tablet;  Take 1 tablet (50 mg total) by mouth at bedtime as needed for sleep.  Dispense: 90 tablet; Refill: 0  2. Diarrhea, unspecified type  - loperamide (IMODIUM A-D) 2 MG tablet; Take 1 tablet (2 mg total) by mouth 4 (four) times daily as needed for diarrhea or loose stools.  Dispense: 30 tablet; Refill: 0  3. Chronic pain disorder  - Ambulatory referral to Pain Clinic    Patient have been counseled extensively about nutrition and exercise. Other issues discussed during this visit include: low cholesterol diet, weight control and daily exercise, foot care, annual eye examinations at Ophthalmology, importance  of adherence with medications and regular follow-up. We also discussed long term complications of uncontrolled diabetes and hypertension.   No follow-ups on file.  The patient was given clear instructions to go to ER or return to medical center if symptoms don't improve, worsen or new problems develop. The patient verbalized understanding. The patient was told to call to get lab results if they haven't heard anything in the next week.   This note has been created with Surveyor, quantity. Any transcriptional errors are unintentional.   [name], NP 10/31/2021, 4:29 PM

## 2021-10-22 NOTE — Telephone Encounter (Signed)
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 He has some slight edema, but she instructed him to elevate. She states that he has good ROM and strength and is anxious to get a prosthetic.   Returned call, two identifiers used. Confirmed that pt has had a face-to-face office visit within the past 6 months. Since BKA is healed, prescription written, signed, and faxed to [facility] for a shrinker to begin the prosthesis process. Confirmed understanding.

## 2021-10-22 NOTE — Telephone Encounter (Signed)
Verbals given  

## 2021-10-23 ENCOUNTER — Other Ambulatory Visit: Payer: Self-pay | Admitting: Radiology

## 2021-10-23 DIAGNOSIS — C220 Liver cell carcinoma: Secondary | ICD-10-CM

## 2021-10-24 ENCOUNTER — Encounter (HOSPITAL_COMMUNITY)
Admission: RE | Admit: 2021-10-24 | Discharge: 2021-10-24 | Disposition: A | Discharge status: Home / Self Care | Payer: Medicare Other | Source: Ambulatory Visit | Attending: Interventional Radiology | Admitting: Interventional Radiology

## 2021-10-24 ENCOUNTER — Ambulatory Visit (HOSPITAL_COMMUNITY)
Admission: RE | Admit: 2021-10-24 | Discharge: 2021-10-24 | Disposition: A | Discharge status: Home / Self Care | Payer: Medicare Other | Source: Ambulatory Visit | Attending: Interventional Radiology | Admitting: Interventional Radiology

## 2021-10-24 ENCOUNTER — Encounter (HOSPITAL_COMMUNITY): Payer: Self-pay

## 2021-10-24 ENCOUNTER — Other Ambulatory Visit: Payer: Self-pay

## 2021-10-24 DIAGNOSIS — C220 Liver cell carcinoma: Secondary | ICD-10-CM | POA: Diagnosis present

## 2021-10-24 DIAGNOSIS — Z992 Dependence on renal dialysis: Secondary | ICD-10-CM | POA: Insufficient documentation

## 2021-10-24 DIAGNOSIS — E1122 Type 2 diabetes mellitus with diabetic chronic kidney disease: Secondary | ICD-10-CM | POA: Insufficient documentation

## 2021-10-24 DIAGNOSIS — N186 End stage renal disease: Secondary | ICD-10-CM | POA: Insufficient documentation

## 2021-10-24 DIAGNOSIS — Z539 Procedure and treatment not carried out, unspecified reason: Secondary | ICD-10-CM | POA: Insufficient documentation

## 2021-10-24 DIAGNOSIS — D631 Anemia in chronic kidney disease: Secondary | ICD-10-CM | POA: Insufficient documentation

## 2021-10-24 DIAGNOSIS — I120 Hypertensive chronic kidney disease with stage 5 chronic kidney disease or end stage renal disease: Secondary | ICD-10-CM | POA: Diagnosis not present

## 2021-10-24 LAB — COMPREHENSIVE METABOLIC PANEL
ALT: 15 U/L (ref 0–44)
AST: 40 U/L (ref 15–41)
Albumin: 2.8 g/dL — ABNORMAL LOW (ref 3.5–5.0)
Alkaline Phosphatase: 152 U/L — ABNORMAL HIGH (ref 38–126)
Anion gap: 20 — ABNORMAL HIGH (ref 5–15)
BUN: 26 mg/dL — ABNORMAL HIGH (ref 8–23)
CO2: 27 mmol/L (ref 22–32)
Calcium: 9.5 mg/dL (ref 8.9–10.3)
Chloride: 95 mmol/L — ABNORMAL LOW (ref 98–111)
Creatinine, Ser: 5.72 mg/dL — ABNORMAL HIGH (ref 0.61–1.24)
GFR, Estimated: 11 mL/min — ABNORMAL LOW (ref >60)
Glucose, Bld: 94 mg/dL (ref 70–99)
Potassium: 3.7 mmol/L (ref 3.5–5.1)
Sodium: 142 mmol/L (ref 135–145)
Total Bilirubin: 0.8 mg/dL (ref 0.3–1.2)
Total Protein: 9.1 g/dL — ABNORMAL HIGH (ref 6.5–8.1)

## 2021-10-24 LAB — CBC WITH DIFFERENTIAL/PLATELET
Abs Immature Granulocytes: 0.04 10*3/uL (ref 0.00–0.07)
Basophils Absolute: 0 10*3/uL (ref 0.0–0.1)
Basophils Relative: 0 %
Eosinophils Absolute: 0.1 10*3/uL (ref 0.0–0.5)
Eosinophils Relative: 1 %
HCT: 26.7 % — ABNORMAL LOW (ref 39.0–52.0)
Hemoglobin: 8.9 g/dL — ABNORMAL LOW (ref 13.0–17.0)
Immature Granulocytes: 1 %
Lymphocytes Relative: 28 %
Lymphs Abs: 1.7 10*3/uL (ref 0.7–4.0)
MCH: 32.6 pg (ref 26.0–34.0)
MCHC: 33.3 g/dL (ref 30.0–36.0)
MCV: 97.8 fL (ref 80.0–100.0)
Monocytes Absolute: 0.6 10*3/uL (ref 0.1–1.0)
Monocytes Relative: 10 %
Neutro Abs: 3.7 10*3/uL (ref 1.7–7.7)
Neutrophils Relative %: 60 %
Platelets: 161 10*3/uL (ref 150–400)
RBC: 2.73 MIL/uL — ABNORMAL LOW (ref 4.22–5.81)
RDW: 18.8 % — ABNORMAL HIGH (ref 11.5–15.5)
WBC: 6.1 10*3/uL (ref 4.0–10.5)
nRBC: 0 % (ref 0.0–0.2)

## 2021-10-24 LAB — PROTIME-INR
INR: 1 (ref 0.8–1.2)
Prothrombin Time: 13 seconds (ref 11.4–15.2)

## 2021-10-24 MED ORDER — LIDOCAINE-EPINEPHRINE 1 %-1:100000 IJ SOLN
INTRAMUSCULAR | Status: AC
  Filled 2021-10-24: qty 1

## 2021-10-24 MED ORDER — LIDOCAINE-PRILOCAINE 2.5-2.5 % EX CREA
TOPICAL_CREAM | Freq: Once | CUTANEOUS | Status: DC
Start: 2021-10-24 — End: 2021-10-25
  Filled 2021-10-24: qty 5

## 2021-10-24 MED ORDER — HEPARIN SODIUM (PORCINE) 1000 UNIT/ML IJ SOLN
INTRAMUSCULAR | Status: AC
  Filled 2021-10-24: qty 10

## 2021-10-24 MED ORDER — VERAPAMIL HCL 2.5 MG/ML IV SOLN
INTRAVENOUS | Status: AC
  Filled 2021-10-24: qty 2

## 2021-10-24 MED ORDER — SODIUM CHLORIDE 0.9 % IV SOLN: INTRAVENOUS | Status: DC

## 2021-10-24 MED ORDER — NITROGLYCERIN IN D5W 100-5 MCG/ML-% IV SOLN
INTRAVENOUS | Status: AC
  Filled 2021-10-24: qty 250

## 2021-10-24 MED ORDER — NITROGLYCERIN 2 % TD OINT
1.0000 [in_us] | TOPICAL_OINTMENT | Freq: Once | TRANSDERMAL | Status: DC
Start: 2021-10-24 — End: 2021-10-25

## 2021-10-24 MED ORDER — NITROGLYCERIN 2 % TD OINT
TOPICAL_OINTMENT | TRANSDERMAL | Status: AC
  Filled 2021-10-24: qty 1

## 2021-10-24 NOTE — H&P (Signed)
Referring Physician(s): Feng,Y  Supervising Physician: Ruthann Cancer  Patient Status:  [facility]  Chief Complaint: Multifocal hepatocellular carcinoma   Subjective: Patient known to IR service from consultation with Dr. Serafina Royals on 09/12/2021 to discuss treatment options for multifocal hepatocellular carcinoma.  He has a past medical history significant for anemia, diabetes, nephrolithiasis, cholelithiasis, end-stage renal disease, hypertension, prior intracerebral hemorrhage in 2019, peripheral arterial disease with prior left BKA, hepatitis C and cirrhosis. Latest CT chest abdomen pelvis performed on 10/08/2021 revealed:  1. Extensive multifocal asymmetric pulmonary consolidation and ground-glass pulmonary infiltrate has progressed within the mid and lower lung zones most in keeping with progressive pneumonic infiltrate in the acute setting. 2. Small bilateral pleural effusions have developed. 3. Unchanged mild peripancreatic edema within the pancreatico duodenal groove, which could reflect changes of mild edematous/interstitial pancreatitis. 4. Known central hepatic masses, previously characterized as LI-RADS 5 lesions, are not well delineated or characterized on this noncontrast examination. 5. Cirrhosis. 6. Cholelithiasis. 7. Nonobstructing left nephrolithiasis.   Aortic Atherosclerosis   Following discussions with Dr. Serafina Royals patient was deemed an appropriate candidate for hepatic Y90 radioembolization and presents today for arterial roadmapping study with possible embolization and test Y90 dosing.   Allergies: Seroquel [quetiapine] and Dilaudid [hydromorphone hcl]  Medications: Prior to Admission medications   Medication Sig Start Date End Date Taking? Authorizing Provider  amLODipine (NORVASC) 5 MG tablet Take 1 tablet (5 mg total) by mouth daily. 09/18/21 12/17/21 Yes [place] Eric J, DO  atorvastatin (LIPITOR) 40 MG tablet Take 1 tablet (40 mg total) by mouth daily.  09/18/21 12/17/21 Yes [place] Eric J, DO  gabapentin (NEURONTIN) 100 MG capsule Take 1 capsule (100 mg total) by mouth 3 (three) times daily. 09/18/21 12/17/21 Yes [place] Donnamarie Poag, DO  loperamide (IMODIUM A-D) 2 MG tablet Take 1 tablet (2 mg total) by mouth 4 (four) times daily as needed for diarrhea or loose stools. 10/22/21  Yes [name], NP  oxyCODONE-acetaminophen (PERCOCET) 5-325 MG tablet Take 1 tablet by mouth every 6 (six) hours as needed. Patient taking differently: Take 1 tablet by mouth every 6 (six) hours as needed for moderate pain. 09/25/21  Yes Milton Ferguson, MD  pantoprazole (PROTONIX) 40 MG tablet Take 1 tablet (40 mg total) by mouth 2 (two) times daily before a meal. 10/10/21 01/08/22 Yes Gonfa, Charlesetta Ivory, MD  traZODone (DESYREL) 50 MG tablet Take 1 tablet (50 mg total) by mouth at bedtime as needed for sleep. 10/22/21  Yes [name], NP  Darbepoetin Alfa (ARANESP) 100 MCG/0.5ML SOSY injection Inject 0.5 mLs (100 mcg total) into the vein every 7 (seven) days for 8 doses. 10/10/21 11/29/21  Mercy Riding, MD  colchicine 0.6 MG tablet Take 0.5 tablets (0.3 mg total) by mouth 2 (two) times daily. 07/24/20 07/29/20  Noemi Chapel, MD  furosemide (LASIX) 40 MG tablet Take 1 tablet (40 mg total) by mouth daily. 07/25/19 02/09/20  Ladona Horns, MD     Vital Signs: BP (!) 154/69   Temp 98 F (36.7 C) (Oral)   Resp 18   Ht '5\' 11"'$  (1.803 m)   Wt 215 lb (97.5 kg)   SpO2 98%   BMI 29.99 kg/m   Physical Exam awake, alert.  Chest with diminished breath sounds bases.  Heart with regular rate and rhythm.  Abdomen soft, positive bowel sounds, nontender.  No significant right lower extremity edema.  Left BKA.  Left upper arm AV fistula with good thrill/bruit.  Imaging: No  results found.  Labs:  CBC: Recent Labs    10/09/21 0201 10/10/21 0135 10/17/21 1128 10/24/21 0812  WBC 4.6 5.2 6.4 6.1  HGB 8.0* 8.8* 8.5* 8.9*  HCT 23.7* 25.6* 25.1* 26.7*  PLT 144* 141* 157 161     COAGS: Recent Labs    10/28/20 0623 03/24/21 1831 07/04/21 0554 09/14/21 2213 09/28/21 0729 10/05/21 0729  INR 1.1   < > 1.1 1.1 1.0 1.1  APTT 34  --   --   --   --   --    < > = values in this interval not displayed.    BMP: Recent Labs    10/07/21 0244 10/08/21 0424 10/09/21 0201 10/10/21 0135  NA 140 135 132* 133*  K 5.3* 4.3 3.4* 3.6  CL 99 98 94* 93*  CO2 '22 24 29 26  '$ GLUCOSE 95 100* 101* 111*  BUN 57* 29* 15 12  CALCIUM 8.8* 8.5* 8.0* 8.3*  CREATININE 7.86* 5.37* 3.78* 3.45*  GFRNONAA 7* 11* 17* 19*    LIVER FUNCTION TESTS: Recent Labs    09/28/21 0729 10/03/21 0900 10/05/21 0729 10/06/21 0447 10/07/21 0244 10/08/21 0424 10/09/21 0201 10/10/21 0135  BILITOT 0.7 0.7 0.8 0.8  --   --   --   --   AST 43* 38 46* 48*  --   --   --   --   ALT '16 11 16 15  '$ --   --   --   --   ALKPHOS 126 144* 124 110  --   --   --   --   PROT 8.4* 8.3* 7.5 7.1  --   --   --   --   ALBUMIN 2.5* 3.2* 2.3* 2.3* 2.4* 2.5* 2.4* 2.5*    Assessment and Plan: Patient known to IR service from consultation with Dr. Serafina Royals on 09/12/2021 to discuss treatment options for multifocal hepatocellular carcinoma.  He has a past medical history significant for anemia, diabetes, nephrolithiasis, cholelithiasis, end-stage renal disease, hypertension, prior intracerebral hemorrhage in 2019, peripheral arterial disease with prior left BKA, hepatitis C and cirrhosis. Latest CT chest abdomen pelvis performed on 10/08/2021 revealed:  1. Extensive multifocal asymmetric pulmonary consolidation and ground-glass pulmonary infiltrate has progressed within the mid and lower lung zones most in keeping with progressive pneumonic infiltrate in the acute setting. 2. Small bilateral pleural effusions have developed. 3. Unchanged mild peripancreatic edema within the pancreatico duodenal groove, which could reflect changes of mild edematous/interstitial pancreatitis. 4. Known central hepatic masses,  previously characterized as LI-RADS 5 lesions, are not well delineated or characterized on this noncontrast examination. 5. Cirrhosis. 6. Cholelithiasis. 7. Nonobstructing left nephrolithiasis.   Aortic Atherosclerosis   Following discussions with Dr. Serafina Royals patient was deemed an appropriate candidate for hepatic Y90 radioembolization and presents today for arterial roadmapping study with possible embolization and test Y90 dosing.Risks and benefits of procedure were discussed with the patient including, but not limited to bleeding, infection, vascular injury , worsening renal function.   This interventional procedure involves the use of X-rays and because of the nature of the planned procedure, it is possible that we will have prolonged use of X-ray fluoroscopy.  Potential radiation risks to you include (but are not limited to) the following: - A slightly elevated risk for cancer  several years later in life. This risk is typically less than 0.5% percent. This risk is low in comparison to the normal incidence of human cancer, which is 33% for women and 50%  for men according to the [employer]. - Radiation induced injury can include skin redness, resembling a rash, tissue breakdown / ulcers and hair loss (which can be temporary or permanent).   The likelihood of either of these occurring depends on the difficulty of the procedure and whether you are sensitive to radiation due to previous procedures, disease, or genetic conditions.   IF your procedure requires a prolonged use of radiation, you will be notified and given written instructions for further action.  It is your responsibility to monitor the irradiated area for the 2 weeks following the procedure and to notify your physician if you are concerned that you have suffered a radiation induced injury.    All of the patient's questions were answered, patient is agreeable to proceed.  Consent signed and in chart.   LABS  PENDING   Electronically Signed: D. Rowe Robert, PA-C 10/24/2021, 8:56 AM   I spent a total of 25 Minutes at the the patient's bedside AND on the patient's hospital floor or unit, greater than 50% of which was counseling/coordinating care for visceral/hepatic arteriogram with possible embolization, test Y90 dosing

## 2021-10-24 NOTE — Discharge Instructions (Addendum)
For questions /concerns may call Interventional Radiology at [phone]  You may remove your dressing and shower tomorrow afternoon Moderate Conscious Sedation, Adult, Care After This sheet gives you information about how to care for yourself after your procedure. Your health care provider may also give you more specific instructions. If you have problems or questions, contact your health careprovider. What can I expect after the procedure? After the procedure, it is common to have: Sleepiness for several hours. Impaired judgment for several hours. Difficulty with balance. Vomiting if you eat too soon. Follow these instructions at home: For the time period you were told by your health care provider: Rest. Do not participate in activities where you could fall or become injured. Do not drive or use machinery. Do not drink alcohol. Do not take sleeping pills or medicines that cause drowsiness. Do not make important decisions or sign legal documents. Do not take care of children on your own. Eating and drinking  Follow the diet recommended by your health care provider. Drink enough fluid to keep your urine pale yellow. If you vomit: Drink water, juice, or soup when you can drink without vomiting. Make sure you have little or no nausea before eating solid foods.  General instructions Take over-the-counter and prescription medicines only as told by your health care provider. Have a responsible adult stay with you for the time you are told. It is important to have someone help care for you until you are awake and alert. Do not smoke. Keep all follow-up visits as told by your health care provider. This is important. Contact a health care provider if: You are still sleepy or having trouble with balance after 24 hours. You feel light-headed. You keep feeling nauseous or you keep vomiting. You develop a rash. You have a fever. You have redness or swelling around the IV site. Get help  right away if: You have trouble breathing. You have new-onset confusion at home. Summary After the procedure, it is common to feel sleepy, have impaired judgment, or feel nauseous if you eat too soon. Rest after you get home. Know the things you should not do after the procedure. Follow the diet recommended by your health care provider and drink enough fluid to keep your urine pale yellow. Get help right away if you have trouble breathing or new-onset confusion at home. This information is not intended to replace advice given to you by your health care provider. Make sure you discuss any questions you have with your healthcare provider.  Discharge Instructions: Please call Interventional Radiology clinic [phone] with any questions or concerns.  You may remove your dressing and shower tomorrow.  Pre Y90-Hepatic Artery Radioembolization, Care After This sheet gives you information about how to care for yourself after your procedure. Your health care provider may also give you more specific instructions. If you have problems or questions, contact your health care provider. What can I expect after the procedure? After the procedure, it is common to have: A slight fever for 1-2 weeks. If your fever gets worse, tell your health care provider. Fatigue. Loss of appetite. This should gradually improve after about 1 week. Abdominal pain on your right side. Soreness and tenderness in your groin area where the needle and catheter were placed (puncture site). Follow these instructions at home:  Puncture site care Follow instructions from your health care provider about how to take care of the puncture site. Make sure you: Wash your hands with soap and water before you change your  bandage (dressing). If soap and water are not available, use hand sanitizer. Change your dressing as told by your health care provider. Leave stitches (sutures), skin glue, or adhesive strips in place. These skin  closures may need to stay in place for 2 weeks or longer. If adhesive strip edges start to loosen and curl up, you may trim the loose edges. Do not remove adhesive strips completely unless your health care provider tells you to do that. Check your puncture site every day for signs of infection. Check for: More redness, swelling, or pain. More fluid or blood. Warmth. Pus or a bad smell. Activity Rest and return to your normal activities as told by your health care provider. Ask your health care provider what activities are safe for you. Do not drive for 24 hours after the procedure if you were given a medicine to help you relax (sedative). Do not lift anything that is heavier than 10 lb (4.5 kg) until your health care provider says that it is safe. Medicines Take over-the-counter and prescription medicines only as told by your health care provider. Do not drive or use heavy machinery while taking prescription pain medicine. Radiation precautions For up to a week after your procedure, there will be a small amount of radioactivity near your liver. This is not especially dangerous to other people. However, you should follow these precautions for 7 days: Do not come in close contact with people. Do not sleep in the same bed as someone else. Do not hold children or babies. Do not have contact with pregnant women. General instructions To prevent or treat constipation while you are taking prescription pain medicine, your health care provider may recommend that you: Drink enough fluid to keep your urine clear or pale yellow. Take over-the-counter or prescription medicines. Eat foods that are high in fiber, such as fresh fruits and vegetables, whole grains, and beans. Limit foods that are high in fat and processed sugars, such as fried and sweet foods. Eat frequent small meals until your appetite returns. Follow instructions from your health care provider about eating or drinking restrictions. Do not  take baths, swim, or use a hot tub until your health care provider approves. You may take showers. Wash your puncture site with mild soap and water and pat the area dry. Wear compression stockings as told by your health care provider. These stockings help to prevent blood clots and reduce swelling in your legs. Keep all follow-up visits as told by your health care provider. This is important. You may need to have blood tests and imaging tests done. Contact a health care provider if: You have more redness, swelling, or pain around your puncture site. You have more fluid or blood coming from your puncture site. Your puncture site feels warm to the touch. You have pus or a bad smell coming from your puncture site. You have pain that: Gets worse. Does not get better with medicine. Feels like very bad heartburn. Is in the middle of your abdomen, above your belly button. Your skin and the white parts of your eyes turn yellow (jaundice). The color of your urine changes to dark brown. The color of your stool changes to light yellow. Your abdominal measurement (girth) increases in a short period of time. You gain more than 5 lb (2.3 kg) in a short period of time. Get help right away if: You have a fever that lasts longer than 2 weeks or is higher than what your health care provider told you  to expect. You develop any of the following in your legs: Pain. Swelling. Skin that is cold or pale or turns blue. You have chest pain. You have blood in your vomit, saliva, or stool. You have trouble breathing. This information is not intended to replace advice given to you by your health care provider. Make sure you discuss any questions you have with your health care provider. Document Revised: 03/27/2017 Document Reviewed: 01/12/2016  Radiation precautions after next procedure  Post Y-90 Radioembolization Discharge Instructions  You have been given a radioactive material during your procedure.  While  it is safe for you to be discharged home from the hospital, you need to proceed directly home.    Do not use public transportation, including air travel, lasting more than 2 hours for 1 week.  Avoid crowded public places for 1 week.  Adult visitors should try to avoid close contact with you for 1 week.    Children and pregnant females should not visit or have close contact with you for 1 week.  Items that you touch are not radioactive.  Do not sleep in the same bed as your partner for 1 week, and a condom should be used for sexual activity during the first 24 hours.  Your blood may be radioactive and caution should be used if any bleeding occurs during the recovery period.  Body fluids may be radioactive for 24 hours.  Wash your hands after voiding.  Men should sit to urinate.  Dispose of any soiled materials (flush down toilet or place in trash at home) during the first day.  Drink 6 to 8 glasses of fluids per day for 5 days to hydrate yourself.  If you need to see a doctor during the first week, you must let them know that you were treated with yttrium-90 microspheres, and will be slightly radioactive.  They can call Interventional Radiology [phone] with any questions.

## 2021-10-24 NOTE — Progress Notes (Signed)
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 Contacted brother and he was not aware to pick pt. Up post procedure.  He called the sister who also can not pick pt. Up. Patient drove himself to [facility] and did not have anyone to stay post discharge.   Informed K Allred PA and Dr. Serafina Royals.   After long discussion of trying to fine someone to pick up patient post proceudre; no one avaibale.   Will reschedule procedure.  Advised patient that he would have to have someone drive him to appoint and stay post procedure for pre mapping and also post Y90 as well. Patient states he understands.

## 2021-10-25 ENCOUNTER — Telehealth (INDEPENDENT_AMBULATORY_CARE_PROVIDER_SITE_OTHER): Payer: Self-pay | Admitting: Primary Care

## 2021-10-25 ENCOUNTER — Ambulatory Visit (INDEPENDENT_AMBULATORY_CARE_PROVIDER_SITE_OTHER): Payer: Self-pay

## 2021-10-25 NOTE — Telephone Encounter (Signed)
Left verbals on secure voicemail.

## 2021-10-25 NOTE — Telephone Encounter (Signed)
Pt is calling report that he has had had diarrhea for 2 weeks please advise     Chief Complaint: Diarrhea. States he was on antibiotic recently. Imodium not helping. Symptoms: 2 x daily Frequency: 2 weeks ago Pertinent Negatives: Patient denies abdominal pain Disposition: '[]'$ ED /'[]'$ Urgent Care (no appt availability in office) / '[]'$ Appointment(In office/virtual)/ '[]'$  [facility]/ '[]'$ Home Care/ '[]'$ Refused Recommended Disposition /'[]'$ [place] [facility]/ '[x]'$  Follow-up with PCP Additional Notes: Please advise pt.  Answer Assessment - Initial Assessment Questions 1. DIARRHEA SEVERITY: "How bad is the diarrhea?" "How many more stools have you had in the past 24 hours than normal?"    - NO DIARRHEA (SCALE 0)   - MILD (SCALE 1-3): Few loose or mushy BMs; increase of 1-3 stools over normal daily number of stools; mild increase in ostomy output.   -  MODERATE (SCALE 4-7): Increase of 4-6 stools daily over normal; moderate increase in ostomy output. * SEVERE (SCALE 8-10; OR 'WORST POSSIBLE'): Increase of 7 or more stools daily over normal; moderate increase in ostomy output; incontinence.     2 2. ONSET: "When did the diarrhea begin?"      2 weeks ago 3. BM CONSISTENCY: "How loose or watery is the diarrhea?"      Loose 4. VOMITING: "Are you also vomiting?" If Yes, ask: "How many times in the past 24 hours?"      No 5. ABDOMINAL PAIN: "Are you having any abdominal pain?" If Yes, ask: "What does it feel like?" (e.g., crampy, dull, intermittent, constant)      No 6. ABDOMINAL PAIN SEVERITY: If present, ask: "How bad is the pain?"  (e.g., Scale 1-10; mild, moderate, or severe)   - MILD (1-3): doesn't interfere with normal activities, abdomen soft and not tender to touch    - MODERATE (4-7): interferes with normal activities or awakens from sleep, abdomen tender to touch    - SEVERE (8-10): excruciating pain, doubled over, unable to do any normal activities       None 7. ORAL INTAKE: If  vomiting, "Have you been able to drink liquids?" "How much liquids have you had in the past 24 hours?"     Yes 8. HYDRATION: "Any signs of dehydration?" (e.g., dry mouth [not just dry lips], too weak to stand, dizziness, new weight loss) "When did you last urinate?"     No 9. EXPOSURE: "Have you traveled to a foreign country recently?" "Have you been exposed to anyone with diarrhea?" "Could you have eaten any food that was spoiled?"     No 10. ANTIBIOTIC USE: "Are you taking antibiotics now or have you taken antibiotics in the past 2 months?"       Yes 11. OTHER SYMPTOMS: "Do you have any other symptoms?" (e.g., fever, blood in stool)       No 12. PREGNANCY: "Is there any chance you are pregnant?" "When was your last menstrual period?"       N/a  Protocols used: [facility]

## 2021-10-25 NOTE — Telephone Encounter (Signed)
Marissa calling from [place] is calling to request verbal orders for social work for [place] visit in two weeks CB- [phone] Verbal ok on VM

## 2021-10-25 NOTE — Telephone Encounter (Signed)
Please advise 

## 2021-10-30 ENCOUNTER — Telehealth (INDEPENDENT_AMBULATORY_CARE_PROVIDER_SITE_OTHER): Payer: Self-pay | Admitting: Primary Care

## 2021-10-30 ENCOUNTER — Encounter: Payer: Self-pay | Admitting: Hematology

## 2021-10-30 NOTE — Telephone Encounter (Signed)
Daniel [name] w/ [facility] states patient has missed his last 2 nursing visits which puts him in non compliance and patients services could be terminated

## 2021-10-30 NOTE — Telephone Encounter (Signed)
Real soft stools at least twice/ day.  Able to tolerate food.   Please advise on what to take to get stools harder. States he eats everything and nothing has hardened stool.   Does not understand "palliative care".

## 2021-10-31 ENCOUNTER — Inpatient Hospital Stay: Payer: Medicare Other | Attending: Internal Medicine

## 2021-10-31 NOTE — Telephone Encounter (Signed)
[facility] OT was with the patient today 7/6 FYI caller stated the patient is still having diarrhea, pt previously spoke with NT.

## 2021-10-31 NOTE — Patient Outreach (Signed)
[facility]) Care Management  10/31/2021 late note for 10/22/21  Daniel [name] 1961/01/30 [id number]  Called but pt was with his home health nurse. Will try again later.  [name], MSN, [facility] Gerontological Nurse Practitioner [facility] Care Management [phone]

## 2021-10-31 NOTE — Telephone Encounter (Signed)
FYI

## 2021-10-31 NOTE — Telephone Encounter (Signed)
Routed to PCP 

## 2021-11-01 ENCOUNTER — Ambulatory Visit (INDEPENDENT_AMBULATORY_CARE_PROVIDER_SITE_OTHER): Payer: Medicare Other

## 2021-11-01 ENCOUNTER — Ambulatory Visit: Payer: Self-pay | Admitting: *Deleted

## 2021-11-01 ENCOUNTER — Other Ambulatory Visit: Payer: Self-pay | Admitting: *Deleted

## 2021-11-01 ENCOUNTER — Encounter (INDEPENDENT_AMBULATORY_CARE_PROVIDER_SITE_OTHER): Payer: Self-pay

## 2021-11-01 DIAGNOSIS — Z0000 Encounter for general adult medical examination without abnormal findings: Secondary | ICD-10-CM

## 2021-11-01 NOTE — Patient Outreach (Signed)
[facility]) Care Management Geriatric Nurse Practitioner Note   11/01/2021 Name:  Daniel [name] MRN:  [id number] DOB:  Jul 19, 1960  Summary: No hospitalization in 3+ weeks! Pt showing more interest in self care and attending appts. Still waiting on housing possibilities from [facility]. Pt DENIES back pain today1  Recommendations/Changes made from today's visit: Praised for his interest in taking care of himself. Continue to take and ACTIVE role in self care.  Subjective: Daniel [name] is an 61 y.o. year old male who is a primary patient of Daniel Perna, NP. The care management team was consulted for assistance with care management and/or care coordination needs.    Geriatric Nurse Practitioner completed Telephone Visit today.   Outpatient Encounter Medications as of 11/01/2021  Medication Sig   amLODipine (NORVASC) 5 MG tablet Take 1 tablet (5 mg total) by mouth daily.   atorvastatin (LIPITOR) 40 MG tablet Take 1 tablet (40 mg total) by mouth daily.   gabapentin (NEURONTIN) 100 MG capsule Take 1 capsule (100 mg total) by mouth 3 (three) times daily.   loperamide (IMODIUM A-D) 2 MG tablet Take 1 tablet (2 mg total) by mouth 4 (four) times daily as needed for diarrhea or loose stools.   pantoprazole (PROTONIX) 40 MG tablet Take 1 tablet (40 mg total) by mouth 2 (two) times daily before a meal.   traZODone (DESYREL) 50 MG tablet Take 1 tablet (50 mg total) by mouth at bedtime as needed for sleep.   [DISCONTINUED] colchicine 0.6 MG tablet Take 0.5 tablets (0.3 mg total) by mouth 2 (two) times daily.   [DISCONTINUED] furosemide (LASIX) 40 MG tablet Take 1 tablet (40 mg total) by mouth daily.   No facility-administered encounter medications on file as of 11/01/2021.   Care Plan  Review of patient past medical history, allergies, medications, health status, including review of consultants reports, laboratory and other test data, was performed as part of comprehensive  evaluation for care management services.   Care Plan : [facility] NP Plan of Care  Updates made by Deloria Lair, NP since 11/01/2021 12:00 AM  Completed 11/01/2021   Problem: Needs handicapped accessible living quarters. Resolved 11/01/2021  Priority: High  Onset Date: 08/29/2021     Goal: Patient will review and fill out applications for appropriate housing over the next 30 days. Extending end date and working of this goal to Pt will assist with proving information for applications. Completed 11/01/2021  Start Date: 08/29/2021  Expected End Date: 11/01/2021  This Visit's Progress: On track  Recent Progress: Not on track  Priority: High  Note:    Update 11/01/21:  (Status: Goal Met.) Short Term Goal  Evaluation of current treatment plan related to COMPLETING [place] and patient's adherence to plan as established by provider Pt worked with NP to complete application. NP turned in application. Pt has not heard anything back. NP to follow up next week on this.  10/15/21 :  (Status: Goal Met.) Short Term Goal  Evaluation of current treatment plan related to [place] and patient's adherence to plan as established by provider NP visited with Mr. Dyal in his boarding home. He was in bed but easily transferred to his W/C. Prior to this visit we had completed as much as we could over the phone and his sister Verdene Lennert assisted. Filled in last information required. NP to turn into the [facility] on [address] today.  Update 10/01/21:  (Status: Goal on track: NO.) Short Term Goal  Evaluation of current treatment plan related to HOUSING APPLICATIONS and patient's adherence to plan as established by provider Pt has not done any applications, He has been in the hospital again for GI bleed and anemia requiring transfusions. He is weak. He goes to dialysis 3 times a week and is now having to go to oncology due to his anemia and discovery of liver ca.      Pt's sister [name] called NP  yesterday. Pt had not mentioned her as a contact. He always says no one wants to help him. Pt gave permission for NP to talk with his sister. She and NP had conversation today regarding his health status and his nnot trying to help himself find new housing. NP advised she is filling out a [facility] application and requests information for questions. Sister will assist with this and will go to several housing communities to apply in person on behalf of his brother. She works 12 hour shifts 2-2 and can help some before she goes to work.  Update 09/19/21:  (Status: Goal on track: NO.) Short Term Goal  Evaluation of current treatment plan related to [place] and patient's adherence to plan as established by provider Pt just discharged from the hospital for 4 day stay anemia requiring 3 pints of blood. 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 He goes to dialysis M-W-F. He has had numerous other MD appts. Talked with him today about his progress in seeking housing and he said he can't do that because he cannot get in and out of his car without assistance. He is a BKA and uses a W/C. NP requested that he call the housing areas that he would like to live in and make and appt to go fill out an application and view the apts. Suggested he could make several appts on the same day and enlist a family member or friend to go with him to assist. He was not happy with the suggestion but he said OK. He says he is supposed to be out of his current housing situation on 09/28/21. He had known about this for some time now and had been provided the information a month ago for available housing options. NP will call LCSW on suggestions for housing that are likely to have openings for quick move in date.  Current Barriers:  Self motivation  RNCM Clinical Goal(s):  Patient will work with care team [facility] NP, [place] CSW, potential housing locations) to choose appropriate housing and complete applications as evidenced by pt report   through  collaboration with Consulting civil engineer, provider, and care team.   Interventions: Inter-disciplinary care team collaboration (see longitudinal plan of care) Evaluation of current treatment plan related to  self management and patient's adherence to plan as established by provider  Patient Goals/Self-Care Activities: Initiate process of finding new housing with resources given.      Problem: Possible new dx of liver and lung cancer Resolved 11/01/2021  Priority: Medium  Onset Date: 08/29/2021     Long-Range Goal: Patient will attend oncology appts over the next 30 days. Extending goal for another 60 days and changed to long term goal. Extending to another 90 days. Completed 11/01/2021  Start Date: 08/29/2021  Expected End Date: 01/17/2022  Recent Progress: Not on track  Priority: Medium  Note:    Update 11/01/21:  (Status: Goal on track: NO.) Long Term Goal  Evaluation of current treatment plan related to [place] and patient's adherence to plan as established  by provider Pt had to cancel his oncology procedure as he didn't have someone to drive him home after sedation. This is rescheduled for the 13th.  Update 10/15/21:  (Status: Goal on Track (progressing): YES.) Long Term Goal  Evaluation of current treatment plan related to ATTENDANCE TO ONCOLOGY APPTS and patient's adherence to plan as established by provider Pt has labs done on 10/03/21 which indicated a serum creatitine of >8.0. Pt goes to dialysis M-W-F. Pt was admitted 2 days later for rectal bleeding he was treated and discharged after a 5 days start. Palliative Care consult was ordered for outpt. Today, he reports he has not heard from them. Advised to please answer all his calls as most are for his benefit, people trying to help him. Advised of appt with [facility] on the 22nd for labs, to see [name] and to receive blood if he requires this.  Update 10/01/21:  (Status: Goal on Track (progressing): YES.) Long Term  Goal  Evaluation of current treatment plan related to ATTENDING ONCOLOGY APPTS and patient's adherence to plan as established by provider Pt is attending these appts. He is needing frequent transfusions due to anemia related to blood loss and to monitor his newly dx liver ca. Praised for him making his appts. Discussed how important it is to have labs drawn to monitor his blood count because if it falls too low it could be life threatening.  Update 09/19/21:  (Status: Goal on track: NO.) Short Term Goal  Evaluation of current treatment plan related to [place] and patient's adherence to plan as established by provider Pt has had one chemotherapy infusion,however, he denies having had any. He did get a blood transfusion the same day so that may have been confusing for him. He does have an appt on 09/26/21, 10/03/21, 10/10/21 and an Interventional Radiaology appt on 10/24/21 for a procedure. NP to follow and remind pt's of his appts.  Current Barriers:  Knowledge Deficits related to plan of care for management of possible new ca.  Chronic Disease Management support and education needs related to possible ca No Advanced Directives in place  RNCM Clinical Goal(s):  Patient will verbalize understanding of plan for management of possible ca as evidenced by pt report. attend all scheduled medical appointments: Oncology as evidenced by chart review        through collaboration with RN Care manager, provider, and care team.   Interventions: Inter-disciplinary care team collaboration (see longitudinal plan of care) Evaluation of current treatment plan related to  self management and patient's adherence to plan as established by provider  Patient Goals/Self-Care Activities: Attend all scheduled provider appointments        Plan: Telephone follow up appointment with care management team member scheduled for:  one month. NP to follow up with [facility] next week. Pt agrees to the plan of  care.  [name], MSN, [facility] Gerontological Nurse Practitioner [facility] Care Management [phone]

## 2021-11-01 NOTE — Progress Notes (Signed)
Subjective:   Daniel [name] is a 61 y.o. male who presents for an Initial Medicare Annual Wellness Visit.  I connected with  Daniel [name] on 11/01/21 by a audio enabled telemedicine application and verified that I am speaking with the correct person using two identifiers.  Patient Location: Other:  dialysis   Provider Location: Office/Clinic  I discussed the limitations of evaluation and management by telemedicine. The patient expressed understanding and agreed to proceed.     Objective:    Today's Vitals   11/01/21 1117  PainSc: 0-No pain   There is no height or weight on file to calculate BMI.     11/01/2021   11:19 AM 10/05/2021    5:55 PM 10/05/2021    7:04 AM 09/28/2021    6:06 PM 09/28/2021    7:09 AM 09/25/2021    9:45 AM 09/14/2021   12:34 PM  Advanced Directives  Does Patient Have a Medical Advance Directive? No  No No Yes No No  Does patient want to make changes to medical advance directive?    No - Patient declined   No - Patient declined  Would patient like information on creating a medical advance directive? No - Patient declined No - Patient declined  No - Patient declined   No - Patient declined    Current Medications (verified) Outpatient Encounter Medications as of 11/01/2021  Medication Sig   amLODipine (NORVASC) 5 MG tablet Take 1 tablet (5 mg total) by mouth daily.   atorvastatin (LIPITOR) 40 MG tablet Take 1 tablet (40 mg total) by mouth daily.   gabapentin (NEURONTIN) 100 MG capsule Take 1 capsule (100 mg total) by mouth 3 (three) times daily.   loperamide (IMODIUM A-D) 2 MG tablet Take 1 tablet (2 mg total) by mouth 4 (four) times daily as needed for diarrhea or loose stools.   pantoprazole (PROTONIX) 40 MG tablet Take 1 tablet (40 mg total) by mouth 2 (two) times daily before a meal.   traZODone (DESYREL) 50 MG tablet Take 1 tablet (50 mg total) by mouth at bedtime as needed for sleep.   [DISCONTINUED] colchicine 0.6 MG tablet Take 0.5 tablets  (0.3 mg total) by mouth 2 (two) times daily.   [DISCONTINUED] Darbepoetin Alfa (ARANESP) 100 MCG/0.5ML SOSY injection Inject 0.5 mLs (100 mcg total) into the vein every 7 (seven) days for 8 doses.   [DISCONTINUED] furosemide (LASIX) 40 MG tablet Take 1 tablet (40 mg total) by mouth daily.   [DISCONTINUED] oxyCODONE-acetaminophen (PERCOCET) 5-325 MG tablet Take 1 tablet by mouth every 6 (six) hours as needed. (Patient taking differently: Take 1 tablet by mouth every 6 (six) hours as needed for moderate pain.)   No facility-administered encounter medications on file as of 11/01/2021.    Allergies (verified) Seroquel [quetiapine] and Dilaudid [hydromorphone hcl]   History: Past Medical History:  Diagnosis Date   Anemia    Diabetes mellitus without complication [facility])    patient denies   Dialysis patient [facility])    End stage chronic kidney disease ([place])    Hypertension    ICH (intracerebral hemorrhage) ([place]) 05/20/2017   PAD (peripheral artery disease) ([facility])    Shoulder pain, left 06/28/2013   Past Surgical History:  Procedure Laterality Date   A/V FISTULAGRAM N/A 08/15/2020   Procedure: A/V FISTULAGRAM - Left Upper;  Surgeon: Cherre Robins, MD;  Location: [place] [facility];  Service: Cardiovascular;  Laterality: N/A;   ABDOMINAL AORTOGRAM W/LOWER EXTREMITY N/A 04/08/2021  Procedure: ABDOMINAL AORTOGRAM W/LOWER EXTREMITY;  Surgeon: Waynetta Sandy, MD;  Location: [place] [facility];  Service: Cardiovascular;  Laterality: N/A;   AMPUTATION Left 04/16/2021   Procedure: LEFT BELOW KNEE AMPUTATION;  Surgeon: Angelia Mould, MD;  Location: [place];  Service: Vascular;  Laterality: Left;   AMPUTATION Left 06/17/2021   Procedure: REVISION AMPUTATION BELOW KNEE;  Surgeon: Broadus John, MD;  Location: [place];  Service: Vascular;  Laterality: Left;   APPENDECTOMY     APPLICATION OF WOUND VAC Left 06/17/2021   Procedure: APPLICATION OF WOUND VAC;  Surgeon: Broadus John, MD;   Location: [place];  Service: Vascular;  Laterality: Left;   AV FISTULA PLACEMENT Left 08/03/2019   Procedure: LEFT ARM ARTERIOVENOUS (AV) CEPHALIC  FISTULA CREATION;  Surgeon: Waynetta Sandy, MD;  Location: [place];  Service: Vascular;  Laterality: Left;   BIOPSY  06/30/2019   Procedure: BIOPSY;  Surgeon: Ronald Lobo, MD;  Location: [facility] ENDOSCOPY;  Service: Endoscopy;;   BIOPSY  08/02/2019   Procedure: BIOPSY;  Surgeon: Yetta Flock, MD;  Location: [facility] ENDOSCOPY;  Service: Gastroenterology;;   BIOPSY  02/08/2021   Procedure: BIOPSY;  Surgeon: Sharyn Creamer, MD;  Location: [facility] ENDOSCOPY;  Service: Gastroenterology;;   BIOPSY  02/24/2021   Procedure: BIOPSY;  Surgeon: [name], MD;  Location: [facility] ENDOSCOPY;  Service: Gastroenterology;;   BIOPSY  03/26/2021   Procedure: BIOPSY;  Surgeon: Lavena Bullion, DO;  Location: [place];  Service: Gastroenterology;;   BIOPSY  08/06/2021   Procedure: BIOPSY;  Surgeon: [name], MD;  Location: [place];  Service: Gastroenterology;;   BIOPSY  09/15/2021   Procedure: BIOPSY;  Surgeon: Sharyn Creamer, MD;  Location: [place];  Service: Gastroenterology;;   COLONOSCOPY  01/23/2012   Procedure: COLONOSCOPY;  Surgeon: Danie Binder, MD;  [facility];  Service: Endoscopy;  Laterality: N/A;  11:10 AM   COLONOSCOPY WITH PROPOFOL N/A 06/30/2019   Procedure: COLONOSCOPY WITH PROPOFOL;  Surgeon: Ronald Lobo, MD;  Location: [place];  Service: Endoscopy;  Laterality: N/A;   ENTEROSCOPY N/A 08/02/2019   Procedure: ENTEROSCOPY;  Surgeon: Yetta Flock, MD;  Location: [facility] ENDOSCOPY;  Service: Gastroenterology;  Laterality: N/A;   ENTEROSCOPY N/A 02/24/2021   Procedure: ENTEROSCOPY;  Surgeon: [name], MD;  Location: [facility] ENDOSCOPY;  Service: Gastroenterology;  Laterality: N/A;   ENTEROSCOPY N/A 03/26/2021   Procedure: ENTEROSCOPY;  Surgeon: Lavena Bullion, DO;  Location: [place];   Service: Gastroenterology;  Laterality: N/A;   ENTEROSCOPY N/A 07/05/2021   Procedure: ENTEROSCOPY;  Surgeon: Carol Ada, MD;  Location: [place];  Service: Gastroenterology;  Laterality: N/A;   ENTEROSCOPY N/A 08/06/2021   Procedure: ENTEROSCOPY;  Surgeon: [name], MD;  Location: [facility] ENDOSCOPY;  Service: Gastroenterology;  Laterality: N/A;   ENTEROSCOPY N/A 08/24/2021   Procedure: ENTEROSCOPY;  Surgeon: Ladene Artist, MD;  Location: [facility] ENDOSCOPY;  Service: Gastroenterology;  Laterality: N/A;   ENTEROSCOPY N/A 09/15/2021   Procedure: ENTEROSCOPY;  Surgeon: Sharyn Creamer, MD;  Location: [facility] ENDOSCOPY;  Service: Gastroenterology;  Laterality: N/A;   ENTEROSCOPY N/A 10/06/2021   Procedure: ENTEROSCOPY;  Surgeon: [name], MD;  Location: [facility] ENDOSCOPY;  Service: Gastroenterology;  Laterality: N/A;   ESOPHAGOGASTRODUODENOSCOPY N/A 08/10/2020   Procedure: ESOPHAGOGASTRODUODENOSCOPY (EGD);  Surgeon: Jerene Bears, MD;  Location: [facility] ENDOSCOPY;  Service: Gastroenterology;  Laterality: N/A;   ESOPHAGOGASTRODUODENOSCOPY (EGD) WITH PROPOFOL N/A 06/30/2019   Procedure: ESOPHAGOGASTRODUODENOSCOPY (EGD) WITH  PROPOFOL;  Surgeon: Ronald Lobo, MD;  Location: [place];  Service: Endoscopy;  Laterality: N/A;   ESOPHAGOGASTRODUODENOSCOPY (EGD) WITH PROPOFOL N/A 01/12/2021   Procedure: ESOPHAGOGASTRODUODENOSCOPY (EGD) WITH PROPOFOL;  Surgeon: Sharyn Creamer, MD;  Location: [place];  Service: Gastroenterology;  Laterality: N/A;   ESOPHAGOGASTRODUODENOSCOPY (EGD) WITH PROPOFOL N/A 02/08/2021   Procedure: ESOPHAGOGASTRODUODENOSCOPY (EGD) WITH PROPOFOL;  Surgeon: Sharyn Creamer, MD;  Location: [place];  Service: Gastroenterology;  Laterality: N/A;   FISTULA SUPERFICIALIZATION Left 10/17/2019   Procedure: LEFT UPPER EXTREMITY FISTULA REVISION, SIDE BRANCH LIGATION,  AND SUPERFICIALIZATION;  Surgeon: Marty Heck, MD;  Location: [place];  Service: Vascular;  Laterality: Left;    FISTULA SUPERFICIALIZATION Left 63/78/5885   Procedure: PLICATION OF ANEURYSM LEFT ARTERIOVENOUS FISTULA;  Surgeon: Angelia Mould, MD;  Location: [place];  Service: Vascular;  Laterality: Left;   FLEXIBLE SIGMOIDOSCOPY N/A 07/05/2021   Procedure: FLEXIBLE SIGMOIDOSCOPY;  Surgeon: Carol Ada, MD;  Location: [place];  Service: Gastroenterology;  Laterality: N/A;   GIVENS CAPSULE STUDY N/A 06/30/2019   Procedure: GIVENS CAPSULE STUDY;  Surgeon: Ronald Lobo, MD;  Location: [place];  Service: Endoscopy;  Laterality: N/A;   GIVENS CAPSULE STUDY N/A 08/25/2021   Procedure: GIVENS CAPSULE STUDY;  Surgeon: Ladene Artist, MD;  Location: [place];  Service: Gastroenterology;  Laterality: N/A;   GIVENS CAPSULE STUDY N/A 10/06/2021   Procedure: GIVENS CAPSULE STUDY;  Surgeon: [name], MD;  Location: [place];  Service: Gastroenterology;  Laterality: N/A;   HEMOSTASIS CLIP PLACEMENT  08/10/2020   Procedure: HEMOSTASIS CLIP PLACEMENT;  Surgeon: Jerene Bears, MD;  Location: [place];  Service: Gastroenterology;;   HEMOSTASIS CLIP PLACEMENT  01/12/2021   Procedure: HEMOSTASIS CLIP PLACEMENT;  Surgeon: Sharyn Creamer, MD;  Location: [place];  Service: Gastroenterology;;   HEMOSTASIS CONTROL  08/02/2019   Procedure: HEMOSTASIS CONTROL;  Surgeon: Yetta Flock, MD;  Location: [place];  Service: Gastroenterology;;   HOT HEMOSTASIS  02/24/2021   Procedure: HOT HEMOSTASIS (ARGON PLASMA COAGULATION/BICAP);  Surgeon: [name], MD;  Location: [facility] ENDOSCOPY;  Service: Gastroenterology;;   HOT HEMOSTASIS N/A 03/26/2021   Procedure: HOT HEMOSTASIS (ARGON PLASMA COAGULATION/BICAP);  Surgeon: Lavena Bullion, DO;  Location: [facility] ENDOSCOPY;  Service: Gastroenterology;  Laterality: N/A;   HOT HEMOSTASIS N/A 07/05/2021   Procedure: HOT HEMOSTASIS (ARGON PLASMA COAGULATION/BICAP);  Surgeon: Carol Ada, MD;  Location: [place];  Service: Gastroenterology;   Laterality: N/A;   HOT HEMOSTASIS N/A 09/15/2021   Procedure: HOT HEMOSTASIS (ARGON PLASMA COAGULATION/BICAP);  Surgeon: Sharyn Creamer, MD;  Location: [place];  Service: Gastroenterology;  Laterality: N/A;   HOT HEMOSTASIS N/A 10/06/2021   Procedure: HOT HEMOSTASIS (ARGON PLASMA COAGULATION/BICAP);  Surgeon: [name], MD;  Location: [place];  Service: Gastroenterology;  Laterality: N/A;   INCISION AND DRAINAGE ABSCESS N/A 06/29/2016   Procedure: INCISION AND DRAINAGE ABDOMINAL WALL ABSCESS;  Surgeon: Alphonsa Overall, MD;  Location: [facility];  Service: General;  Laterality: N/A;   INSERTION OF DIALYSIS CATHETER Right 08/03/2019   Procedure: INSERTION OF DIALYSIS CATHETER;  Surgeon: Waynetta Sandy, MD;  Location: [place];  Service: Vascular;  Laterality: Right;   INSERTION OF DIALYSIS CATHETER Right 10/22/2019   Procedure: INSERTION OF 23CM TUNNELED DIALYSIS CATHETER RIGHT INTERNAL JUGULAR;  Surgeon: Angelia Mould, MD;  Location: [place];  Service: Vascular;  Laterality: Right;   INSERTION OF DIALYSIS CATHETER Right 08/12/2020   Procedure: INSERTION OF Right internal Jugular TUNNELED  DIALYSIS CATHETER.;  Surgeon: Waynetta Sandy, MD;  Location: [place];  Service: Vascular;  Laterality: Right;   INSERTION OF DIALYSIS CATHETER N/A 04/19/2021   Procedure: INSERTION OF TUNNELED DIALYSIS CATHETER;  Surgeon: Angelia Mould, MD;  Location: [facility] OR;  Service: Vascular;  Laterality: N/A;   IR RADIOLOGIST EVAL & MGMT  09/12/2021   Left heel surgery     LOWER EXTREMITY ANGIOGRAPHY N/A 07/08/2021   Procedure: Lower Extremity Angiography;  Surgeon: Cherre Robins, MD;  Location: [place] [facility];  Service: Cardiovascular;  Laterality: N/A;   PENILE BIOPSY N/A 03/26/2020   Procedure: PENILE ULCER DEBRIDEMENT;  Surgeon: Remi Haggard, MD;  Location: [facility];  Service: Urology;  Laterality: N/A;  30 MINS   PERIPHERAL VASCULAR INTERVENTION Right 07/08/2021   Procedure:  PERIPHERAL VASCULAR INTERVENTION;  Surgeon: Cherre Robins, MD;  Location: [place] [facility];  Service: Cardiovascular;  Laterality: Right;   SCLEROTHERAPY  01/12/2021   Procedure: SCLEROTHERAPY;  Surgeon: Sharyn Creamer, MD;  Location: [facility] ENDOSCOPY;  Service: Gastroenterology;;   Clide Deutscher  02/24/2021   Procedure: Clide Deutscher;  Surgeon: [name], MD;  Location: [facility] ENDOSCOPY;  Service: Gastroenterology;;   SUBMUCOSAL TATTOO INJECTION  08/24/2021   Procedure: SUBMUCOSAL TATTOO INJECTION;  Surgeon: Ladene Artist, MD;  Location: [facility] ENDOSCOPY;  Service: Gastroenterology;;   SUBMUCOSAL TATTOO INJECTION  09/15/2021   Procedure: SUBMUCOSAL TATTOO INJECTION;  Surgeon: Sharyn Creamer, MD;  Location: [facility] ENDOSCOPY;  Service: Gastroenterology;;   Family History  Problem Relation Age of Onset   Colon cancer Neg Hx    Social History   Socioeconomic History   Marital status: Single    Spouse name: Not on file   Number of children: 2   Years of education: 12   Highest education level: 12th grade  Occupational History   Occupation: disabled  Tobacco Use   Smoking status: Every Day    Packs/day: 0.50    Years: 45.00    Total pack years: 22.50    Types: Cigarettes   Smokeless tobacco: Never   Tobacco comments:    Pt left before information given  Vaping Use   Vaping Use: Never used  Substance and Sexual Activity   Alcohol use: Not Currently   Drug use: Not Currently    Types: "Crack" cocaine    Comment: last in 2020   Sexual activity: Yes    Birth control/protection: None  Other Topics Concern   Not on file  Social History Narrative   Goes to dialysis M-W-F, [facility] using a wheelchair at present, waiting for prosthetic. Looking for handicapped housing for immediate move in.   Social Determinants of Health   Financial Resource Strain: High Risk (11/01/2021)   Overall Financial Resource Strain (CARDIA)    Difficulty of Paying Living Expenses: Hard  Food Insecurity: No  Food Insecurity (11/01/2021)   Hunger Vital Sign    Worried About Running Out of Food in the Last Year: Never true    Ran Out of Food in the Last Year: Never true  Transportation Needs: No Transportation Needs (11/01/2021)   PRAPARE - Hydrologist (Medical): No    Lack of Transportation (Non-Medical): No  Physical Activity: Insufficiently Active (11/01/2021)   Exercise Vital Sign    Days of Exercise per Week: 1 day    Minutes of Exercise per Session: 20 min  Stress: No Stress Concern Present (11/01/2021)   Alligator  Feeling of Stress : Not at all  Social Connections: Socially Isolated (11/01/2021)   Social Connection and Isolation Panel [NHANES]    Frequency of Communication with Friends and Family: More than three times a week    Frequency of Social Gatherings with Friends and Family: More than three times a week    Attends Religious Services: Never    Marine scientist or Organizations: No    Attends Music therapist: Never    Marital Status: Divorced    Tobacco Counseling Ready to quit: Not Answered Counseling given: Not Answered Tobacco comments: Pt left before information given   Clinical Intake:  Pre-visit preparation completed: Yes  Pain : No/denies pain Pain Score: 0-No pain     Diabetes: No     Diabetic?no         Activities of Daily Living    11/01/2021   11:20 AM 10/05/2021    5:55 PM  In your present state of health, do you have any difficulty performing the following activities:  Hearing? 0 0  Vision? 0 0  Difficulty concentrating or making decisions? 0 0  Walking or climbing stairs? 0 0  Dressing or bathing? 0 0  Doing errands, shopping? 0 0  Preparing Food and eating ? N   Using the Toilet? N   In the past six months, have you accidently leaked urine? N   Do you have problems with loss of bowel control? N   Managing your Medications?  N   Managing your Finances? N   Housekeeping or managing your Housekeeping? N     Patient Care Team: [name], NP as PCP - General (Internal Medicine) Center, [facility], NP as [place] any recent [place] you may have received from other than [facility] providers in the past year (date may be approximate).     Assessment:   This is a routine wellness examination for Daniel [name].  Hearing/Vision screen No results found.  Dietary issues and exercise activities discussed:     Depression Screen    11/01/2021   11:20 AM 10/22/2021   10:26 AM 09/03/2021    9:17 AM 03/13/2021    1:45 PM 02/13/2021    2:48 PM 09/26/2020    9:10 AM 08/22/2020   10:58 AM  PHQ 2/9 Scores  PHQ - 2 Score 0 0 1    0  Exception Documentation    Patient refusal Patient refusal Patient refusal     Fall Risk    11/01/2021   11:19 AM 10/22/2021   10:26 AM 09/03/2021    9:17 AM 08/29/2021   12:55 PM 02/13/2021    2:48 PM  [place] in the past year? 0 0 0 1 1  Number falls in past yr: 0   1 0  Injury with Fall? 0   1 0  Risk for fall due to : No Fall Risks  Impaired mobility;Impaired balance/gait History of fall(s);Impaired balance/gait;Impaired mobility;Medication side effect;Orthopedic patient   Follow up Falls evaluation completed   Falls evaluation completed     FALL RISK PREVENTION PERTAINING TO THE HOME:  Any stairs in or around the home? Yes  If so, are there any without handrails? Yes  Home free of loose throw rugs in walkways, pet beds, electrical cords, etc? Yes  Adequate lighting in your home to reduce risk of falls? Yes   ASSISTIVE DEVICES UTILIZED TO PREVENT FALLS:  Life alert?  No  Use of a cane, walker or w/c? Yes  Grab bars in the bathroom? No  Shower chair or bench in shower? No  Elevated toilet seat or a handicapped toilet? Yes   Cognitive Function:        11/01/2021   11:21 AM  6CIT Screen   What time? 0 points  Count back from 20 0 points  Months in reverse 4 points  Repeat phrase 10 points    Immunizations Immunization History  Administered Date(s) Administered   Influenza,inj,Quad PF,6+ Mos 06/04/2017, 01/17/2019   Influenza-Unspecified 01/21/2020   Janssen (J&J) SARS-COV-2 Vaccination 08/29/2019   Pneumococcal Polysaccharide-23 05/24/2017, 03/31/2019   Tdap 06/04/2017, 01/05/2021    TDAP status: Up to date  Flu Vaccine status: Up to date  Pneumococcal vaccine status: Due, Education has been provided regarding the importance of this vaccine. Advised may receive this vaccine at local pharmacy or Health Dept. Aware to provide a copy of the vaccination record if obtained from local pharmacy or Health Dept. Verbalized acceptance and understanding.  Covid-19 vaccine status: Information provided on how to obtain vaccines.   Qualifies for Shingles Vaccine? Yes   Zostavax completed No   Shingrix Completed?: No.    Education has been provided regarding the importance of this vaccine. Patient has been advised to call insurance company to determine out of pocket expense if they have not yet received this vaccine. Advised may also receive vaccine at local pharmacy or Health Dept. Verbalized acceptance and understanding.  Screening Tests Health Maintenance  Topic Date Due   FOOT EXAM  Never done   OPHTHALMOLOGY EXAM  Never done   Zoster Vaccines- Shingrix (1 of 2) Never done   COVID-19 Vaccine (2 - Janssen risk series) 09/26/2019   URINE MICROALBUMIN  06/26/2020   INFLUENZA VACCINE  11/26/2021   HEMOGLOBIN A1C  03/06/2022   COLONOSCOPY (Pts 45-80yr Insurance coverage will need to be confirmed)  06/29/2029   TETANUS/TDAP  01/06/2031   Hepatitis C Screening  Completed   HIV Screening  Completed   HPV VACCINES  Aged Out    Health Maintenance  Health Maintenance Due  Topic Date Due   FOOT EXAM  Never done   OPHTHALMOLOGY EXAM  Never done   Zoster Vaccines-  Shingrix (1 of 2) Never done   COVID-19 Vaccine (2 - Janssen risk series) 09/26/2019   URINE MICROALBUMIN  06/26/2020    Colorectal cancer screening: Type of screening: Colonoscopy. Completed [id number] Repeat every 10 years  Lung Cancer Screening: (Low Dose CT Chest recommended if Age 62-80years, 30 pack-year currently smoking OR have quit w/in 15years.) does qualify.   Lung Cancer Screening Referral: completed  Additional Screening:  Hepatitis C Screening: does qualify; Completed yes   Vision Screening: Recommended annual ophthalmology exams for early detection of glaucoma and other disorders of the eye. Is the patient up to date with their annual eye exam?  No  Who is the provider or what is the name of the office in which the patient attends annual eye exams? na If pt is not established with a provider, would they like to be referred to a provider to establish care? Yes .   Dental Screening: Recommended annual dental exams for proper oral hygiene  Community Resource Referral / Chronic Care Management: CRR required this visit?  No   CCM required this visit?  No      Plan:     I have personally reviewed and noted the following in the patient's chart:  Medical and social history Use of alcohol, tobacco or illicit drugs  Current medications and supplements including opioid prescriptions. Patient is not currently taking opioid prescriptions. Functional ability and status Nutritional status Physical activity Advanced directives List of other physicians Hospitalizations, surgeries, and ER visits in previous 12 months Vitals Screenings to include cognitive, depression, and falls Referrals and appointments  In addition, I have reviewed and discussed with patient certain preventive protocols, quality metrics, and best practice recommendations. A written personalized care plan for preventive services as well as general preventive health recommendations were provided to  patient.     Octavio Manns   11/01/2021   Nurse Notes:    Daniel [name] , Thank you for taking time to come for your Medicare Wellness Visit. I appreciate your ongoing commitment to your health goals. Please review the following plan we discussed and let me know if I can assist you in the future.   These are the goals we discussed:    This is a list of the screening recommended for you and due dates:  Health Maintenance  Topic Date Due   Complete foot exam   Never done   Eye exam for diabetics  Never done   Zoster (Shingles) Vaccine (1 of 2) Never done   COVID-19 Vaccine (2 - Janssen risk series) 09/26/2019   Urine Protein Check  06/26/2020   Flu Shot  11/26/2021   Hemoglobin A1C  03/06/2022   Colon Cancer Screening  06/29/2029   Tetanus Vaccine  01/06/2031   Hepatitis C Screening: USPSTF Recommendation to screen - Ages 18-79 yo.  Completed   HIV Screening  Completed   HPV Vaccine  Aged Out

## 2021-11-05 ENCOUNTER — Encounter: Payer: Self-pay | Admitting: *Deleted

## 2021-11-05 NOTE — Patient Outreach (Signed)
[place] [facility]) Care Management  11/05/2021  Daniel [name] 1961-01-15 [id number]  Called [facility] to follow up on pt application for housing. Left message and requested a return call with an update on the process.  [name], MSN, [name] Gerontological Nurse Practitioner [facility] Care Management [phone]

## 2021-11-05 NOTE — Telephone Encounter (Signed)
Patient is in his right mind and can refuse any service or advise provided.

## 2021-11-06 ENCOUNTER — Other Ambulatory Visit: Payer: Self-pay | Admitting: Radiology

## 2021-11-06 DIAGNOSIS — C220 Liver cell carcinoma: Secondary | ICD-10-CM

## 2021-11-07 ENCOUNTER — Ambulatory Visit (HOSPITAL_COMMUNITY): Payer: Medicare Other

## 2021-11-07 ENCOUNTER — Other Ambulatory Visit: Payer: Self-pay

## 2021-11-07 ENCOUNTER — Other Ambulatory Visit (HOSPITAL_COMMUNITY): Payer: Medicare Other

## 2021-11-07 ENCOUNTER — Encounter (HOSPITAL_COMMUNITY)
Admission: RE | Admit: 2021-11-07 | Discharge: 2021-11-07 | Disposition: A | Discharge status: Home / Self Care | Payer: Medicare Other | Source: Ambulatory Visit | Attending: Interventional Radiology | Admitting: Interventional Radiology

## 2021-11-07 ENCOUNTER — Ambulatory Visit (HOSPITAL_COMMUNITY)
Admission: RE | Admit: 2021-11-07 | Discharge: 2021-11-07 | Disposition: A | Discharge status: Home / Self Care | Payer: Medicare Other | Source: Ambulatory Visit | Attending: Interventional Radiology | Admitting: Interventional Radiology

## 2021-11-07 ENCOUNTER — Other Ambulatory Visit (INDEPENDENT_AMBULATORY_CARE_PROVIDER_SITE_OTHER): Payer: Self-pay | Admitting: Primary Care

## 2021-11-07 ENCOUNTER — Other Ambulatory Visit (HOSPITAL_COMMUNITY): Payer: Self-pay | Admitting: Interventional Radiology

## 2021-11-07 ENCOUNTER — Encounter (HOSPITAL_COMMUNITY): Payer: Self-pay

## 2021-11-07 DIAGNOSIS — E1151 Type 2 diabetes mellitus with diabetic peripheral angiopathy without gangrene: Secondary | ICD-10-CM | POA: Insufficient documentation

## 2021-11-07 DIAGNOSIS — Z89512 Acquired absence of left leg below knee: Secondary | ICD-10-CM | POA: Insufficient documentation

## 2021-11-07 DIAGNOSIS — N186 End stage renal disease: Secondary | ICD-10-CM | POA: Insufficient documentation

## 2021-11-07 DIAGNOSIS — N200 Calculus of kidney: Secondary | ICD-10-CM | POA: Insufficient documentation

## 2021-11-07 DIAGNOSIS — I120 Hypertensive chronic kidney disease with stage 5 chronic kidney disease or end stage renal disease: Secondary | ICD-10-CM | POA: Insufficient documentation

## 2021-11-07 DIAGNOSIS — K8020 Calculus of gallbladder without cholecystitis without obstruction: Secondary | ICD-10-CM | POA: Insufficient documentation

## 2021-11-07 DIAGNOSIS — E1122 Type 2 diabetes mellitus with diabetic chronic kidney disease: Secondary | ICD-10-CM | POA: Insufficient documentation

## 2021-11-07 DIAGNOSIS — Z992 Dependence on renal dialysis: Secondary | ICD-10-CM | POA: Insufficient documentation

## 2021-11-07 DIAGNOSIS — C220 Liver cell carcinoma: Secondary | ICD-10-CM | POA: Insufficient documentation

## 2021-11-07 DIAGNOSIS — K7460 Unspecified cirrhosis of liver: Secondary | ICD-10-CM | POA: Insufficient documentation

## 2021-11-07 DIAGNOSIS — E1142 Type 2 diabetes mellitus with diabetic polyneuropathy: Secondary | ICD-10-CM

## 2021-11-07 HISTORY — PX: IR ANGIOGRAM SELECTIVE EACH ADDITIONAL VESSEL: IMG667

## 2021-11-07 HISTORY — PX: IR EMBO ARTERIAL NOT HEMORR HEMANG INC GUIDE ROADMAPPING: IMG5448

## 2021-11-07 HISTORY — PX: IR 3D INDEPENDENT WKST: IMG2385

## 2021-11-07 HISTORY — PX: IR US GUIDE VASC ACCESS LEFT: IMG2389

## 2021-11-07 HISTORY — PX: IR ANGIOGRAM VISCERAL SELECTIVE: IMG657

## 2021-11-07 LAB — CBC WITH DIFFERENTIAL/PLATELET
Abs Immature Granulocytes: 0.01 10*3/uL (ref 0.00–0.07)
Basophils Absolute: 0 10*3/uL (ref 0.0–0.1)
Basophils Relative: 0 %
Eosinophils Absolute: 0 10*3/uL (ref 0.0–0.5)
Eosinophils Relative: 1 %
HCT: 22.8 % — ABNORMAL LOW (ref 39.0–52.0)
Hemoglobin: 7.8 g/dL — ABNORMAL LOW (ref 13.0–17.0)
Immature Granulocytes: 0 %
Lymphocytes Relative: 27 %
Lymphs Abs: 1.1 10*3/uL (ref 0.7–4.0)
MCH: 33.8 pg (ref 26.0–34.0)
MCHC: 34.2 g/dL (ref 30.0–36.0)
MCV: 98.7 fL (ref 80.0–100.0)
Monocytes Absolute: 0.6 10*3/uL (ref 0.1–1.0)
Monocytes Relative: 13 %
Neutro Abs: 2.5 10*3/uL (ref 1.7–7.7)
Neutrophils Relative %: 59 %
Platelets: 105 10*3/uL — ABNORMAL LOW (ref 150–400)
RBC: 2.31 MIL/uL — ABNORMAL LOW (ref 4.22–5.81)
RDW: 18.6 % — ABNORMAL HIGH (ref 11.5–15.5)
WBC: 4.2 10*3/uL (ref 4.0–10.5)
nRBC: 0 % (ref 0.0–0.2)

## 2021-11-07 LAB — COMPREHENSIVE METABOLIC PANEL
ALT: 17 U/L (ref 0–44)
AST: 42 U/L — ABNORMAL HIGH (ref 15–41)
Albumin: 2.8 g/dL — ABNORMAL LOW (ref 3.5–5.0)
Alkaline Phosphatase: 132 U/L — ABNORMAL HIGH (ref 38–126)
Anion gap: 18 — ABNORMAL HIGH (ref 5–15)
BUN: 41 mg/dL — ABNORMAL HIGH (ref 8–23)
CO2: 25 mmol/L (ref 22–32)
Calcium: 8.5 mg/dL — ABNORMAL LOW (ref 8.9–10.3)
Chloride: 98 mmol/L (ref 98–111)
Creatinine, Ser: 6.94 mg/dL — ABNORMAL HIGH (ref 0.61–1.24)
GFR, Estimated: 8 mL/min — ABNORMAL LOW (ref >60)
Glucose, Bld: 89 mg/dL (ref 70–99)
Potassium: 3.8 mmol/L (ref 3.5–5.1)
Sodium: 141 mmol/L (ref 135–145)
Total Bilirubin: 0.9 mg/dL (ref 0.3–1.2)
Total Protein: 8.6 g/dL — ABNORMAL HIGH (ref 6.5–8.1)

## 2021-11-07 LAB — GLUCOSE, CAPILLARY: Glucose-Capillary: 88 mg/dL (ref 70–99)

## 2021-11-07 LAB — PROTIME-INR
INR: 1.1 (ref 0.8–1.2)
Prothrombin Time: 13.8 seconds (ref 11.4–15.2)

## 2021-11-07 MED ORDER — VERAPAMIL HCL 2.5 MG/ML IV SOLN
INTRA_ARTERIAL | Status: AC | PRN
  Administered 2021-11-07: 6 mL via INTRA_ARTERIAL

## 2021-11-07 MED ORDER — NITROGLYCERIN 2 % TD OINT
TOPICAL_OINTMENT | TRANSDERMAL | Status: AC
  Filled 2021-11-07: qty 1

## 2021-11-07 MED ORDER — MIDAZOLAM HCL 2 MG/2ML IJ SOLN
INTRAMUSCULAR | Status: AC | PRN
  Administered 2021-11-07 (×4): 1 mg via INTRAVENOUS

## 2021-11-07 MED ORDER — VERAPAMIL HCL 2.5 MG/ML IV SOLN
INTRAVENOUS | Status: AC
  Filled 2021-11-07: qty 2

## 2021-11-07 MED ORDER — IOHEXOL 300 MG/ML  SOLN
100.0000 mL | Freq: Once | INTRAMUSCULAR | Status: AC | PRN
Start: 2021-11-07 — End: 2021-11-07
  Administered 2021-11-07: 64 mL via INTRA_ARTERIAL

## 2021-11-07 MED ORDER — FENTANYL CITRATE (PF) 100 MCG/2ML IJ SOLN
INTRAMUSCULAR | Status: AC | PRN
  Administered 2021-11-07 (×2): 50 ug via INTRAVENOUS

## 2021-11-07 MED ORDER — TECHNETIUM TO 99M ALBUMIN AGGREGATED
4.3600 | Freq: Once | INTRAVENOUS | Status: AC | PRN
  Administered 2021-11-07: 4.36 via INTRAVENOUS

## 2021-11-07 MED ORDER — NITROGLYCERIN 2 % TD OINT: 1.0000 [in_us] | TOPICAL_OINTMENT | Freq: Once | TRANSDERMAL | Status: DC

## 2021-11-07 MED ORDER — NITROGLYCERIN IN D5W 100-5 MCG/ML-% IV SOLN
INTRAVENOUS | Status: AC
  Filled 2021-11-07: qty 250

## 2021-11-07 MED ORDER — IOHEXOL 300 MG/ML  SOLN
100.0000 mL | Freq: Once | INTRAMUSCULAR | Status: AC | PRN
Start: 2021-11-07 — End: 2021-11-07
  Administered 2021-11-07: 63 mL via INTRA_ARTERIAL

## 2021-11-07 MED ORDER — MIDAZOLAM HCL 2 MG/2ML IJ SOLN
INTRAMUSCULAR | Status: AC
  Filled 2021-11-07: qty 4

## 2021-11-07 MED ORDER — GABAPENTIN 300 MG PO CAPS: 300.0000 mg | ORAL_CAPSULE | Freq: Three times a day (TID) | ORAL | 3 refills | Status: DC

## 2021-11-07 MED ORDER — LIDOCAINE-EPINEPHRINE 1 %-1:100000 IJ SOLN
INTRAMUSCULAR | Status: AC
  Filled 2021-11-07: qty 1

## 2021-11-07 MED ORDER — NITROGLYCERIN 2 % TD OINT: 1.0000 [in_us] | TOPICAL_OINTMENT | Freq: Four times a day (QID) | TRANSDERMAL | Status: DC

## 2021-11-07 MED ORDER — FENTANYL CITRATE (PF) 100 MCG/2ML IJ SOLN
INTRAMUSCULAR | Status: AC
  Filled 2021-11-07: qty 2

## 2021-11-07 MED ORDER — LIDOCAINE-PRILOCAINE 2.5-2.5 % EX CREA
TOPICAL_CREAM | Freq: Once | CUTANEOUS | Status: DC
Start: 2021-11-07 — End: 2021-11-08
  Filled 2021-11-07: qty 5

## 2021-11-07 MED ORDER — SODIUM CHLORIDE 0.9 % IV SOLN: INTRAVENOUS | Status: DC

## 2021-11-07 MED ORDER — HEPARIN SODIUM (PORCINE) 1000 UNIT/ML IJ SOLN
INTRAMUSCULAR | Status: AC
  Filled 2021-11-07: qty 10

## 2021-11-07 NOTE — Sedation Documentation (Signed)
To nuc med for study 

## 2021-11-07 NOTE — Procedures (Signed)
Interventional Radiology Procedure Note  Procedure:  1) Hepatic angiogram 2) [facility] beam CT 3) Tc-MAA administration into segment VIII artery  Findings: Please refer to procedural dictation for full description. 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  Left radial artery access with TR band applied at 12:00.  Complications: None immediate  Estimated Blood Loss: < 18m  Recommendations: TR band protocol, 14 mL at 12:00. Please discharge home 1 hour after TR band removed successfully. IR will arrange for 2 week follow up Y-90 administration.  Plan for segment VIII radiation segmentectomy.  DRuthann Cancer MD Pager: [phone]

## 2021-11-07 NOTE — H&P (Signed)
Referring Physician(s): Feng,Y  Supervising Physician: Ruthann Cancer  Patient Status:  [facility]  Chief Complaint: Multifocal hepatocellular carcinoma   Subjective: Patient known to IR service from consultation with Dr. Serafina Royals on 09/12/2021 to discuss treatment options for multifocal hepatocellular carcinoma.  He has a past medical history significant for anemia, diabetes, nephrolithiasis, cholelithiasis, end-stage renal disease, hypertension, prior intracerebral hemorrhage in 2019, peripheral arterial disease with prior left BKA, hepatitis C and cirrhosis. Latest CT chest abdomen pelvis performed on 10/09/2021 revealed:  1. Extensive multifocal asymmetric pulmonary consolidation and ground-glass pulmonary infiltrate has progressed within the mid and lower lung zones most in keeping with progressive pneumonic infiltrate in the acute setting. 2. Small bilateral pleural effusions have developed. 3. Unchanged mild peripancreatic edema within the pancreatico duodenal groove, which could reflect changes of mild edematous/interstitial pancreatitis. 4. Known central hepatic masses, previously characterized as LI-RADS 5 lesions, are not well delineated or characterized on this noncontrast examination. 5. Cirrhosis. 6. Cholelithiasis. 7. Nonobstructing left nephrolithiasis.   Aortic Atherosclerosis    Following discussions with Dr. Serafina Royals patient was deemed an appropriate candidate for hepatic Y90 radioembolization .  He presented to [facility] short stay center on 6/29 for arterial roadmapping study, however procedure could not be done because patient drove himself and was unable to find a ride home afterwards.  He presents again today to undergo arterial roadmapping study-test Y90 dosing.  He currently denies fever, headache, chest pain, dyspnea, cough, abdominal/back pain, nausea, vomiting or bleeding.  Past Medical History:  Diagnosis Date   Anemia    Diabetes mellitus  without complication [facility])    patient denies   Dialysis patient [facility])    End stage chronic kidney disease ([place])    Hypertension    ICH (intracerebral hemorrhage) ([place]) 05/20/2017   PAD (peripheral artery disease) ([facility])    Shoulder pain, left 06/28/2013   Past Surgical History:  Procedure Laterality Date   A/V FISTULAGRAM N/A 08/15/2020   Procedure: A/V FISTULAGRAM - Left Upper;  Surgeon: Cherre Robins, MD;  Location: [place] CV LAB;  Service: Cardiovascular;  Laterality: N/A;   ABDOMINAL AORTOGRAM W/LOWER EXTREMITY N/A 04/08/2021   Procedure: ABDOMINAL AORTOGRAM W/LOWER EXTREMITY;  Surgeon: Waynetta Sandy, MD;  Location: [place] CV LAB;  Service: Cardiovascular;  Laterality: N/A;   AMPUTATION Left 04/16/2021   Procedure: LEFT BELOW KNEE AMPUTATION;  Surgeon: Angelia Mould, MD;  Location: [place];  Service: Vascular;  Laterality: Left;   AMPUTATION Left 06/17/2021   Procedure: REVISION AMPUTATION BELOW KNEE;  Surgeon: Broadus John, MD;  Location: [place];  Service: Vascular;  Laterality: Left;   APPENDECTOMY     APPLICATION OF WOUND VAC Left 06/17/2021   Procedure: APPLICATION OF WOUND VAC;  Surgeon: Broadus John, MD;  Location: [place];  Service: Vascular;  Laterality: Left;   AV FISTULA PLACEMENT Left 08/03/2019   Procedure: LEFT ARM ARTERIOVENOUS (AV) CEPHALIC  FISTULA CREATION;  Surgeon: Waynetta Sandy, MD;  Location: [place];  Service: Vascular;  Laterality: Left;   BIOPSY  06/30/2019   Procedure: BIOPSY;  Surgeon: Ronald Lobo, MD;  Location: [facility] ENDOSCOPY;  Service: Endoscopy;;   BIOPSY  08/02/2019   Procedure: BIOPSY;  Surgeon: Yetta Flock, MD;  Location: [facility] ENDOSCOPY;  Service: Gastroenterology;;   BIOPSY  02/08/2021   Procedure: BIOPSY;  Surgeon: Sharyn Creamer, MD;  Location: [facility] ENDOSCOPY;  Service: Gastroenterology;;   BIOPSY  02/24/2021   Procedure: BIOPSY;  Surgeon: Dustin Flock  E, MD;  Location: [place] ENDOSCOPY;  Service:  Gastroenterology;;   BIOPSY  03/26/2021   Procedure: BIOPSY;  Surgeon: Lavena Bullion, DO;  Location: [place];  Service: Gastroenterology;;   BIOPSY  08/06/2021   Procedure: BIOPSY;  Surgeon: [name], MD;  Location: [place];  Service: Gastroenterology;;   BIOPSY  09/15/2021   Procedure: BIOPSY;  Surgeon: Sharyn Creamer, MD;  Location: [place];  Service: Gastroenterology;;   COLONOSCOPY  01/23/2012   Procedure: COLONOSCOPY;  Surgeon: Danie Binder, MD;  [facility];  Service: Endoscopy;  Laterality: N/A;  11:10 AM   COLONOSCOPY WITH PROPOFOL N/A 06/30/2019   Procedure: COLONOSCOPY WITH PROPOFOL;  Surgeon: Ronald Lobo, MD;  Location: [place];  Service: Endoscopy;  Laterality: N/A;   ENTEROSCOPY N/A 08/02/2019   Procedure: ENTEROSCOPY;  Surgeon: Yetta Flock, MD;  Location: [facility] ENDOSCOPY;  Service: Gastroenterology;  Laterality: N/A;   ENTEROSCOPY N/A 02/24/2021   Procedure: ENTEROSCOPY;  Surgeon: [name], MD;  Location: [facility] ENDOSCOPY;  Service: Gastroenterology;  Laterality: N/A;   ENTEROSCOPY N/A 03/26/2021   Procedure: ENTEROSCOPY;  Surgeon: Lavena Bullion, DO;  Location: [place];  Service: Gastroenterology;  Laterality: N/A;   ENTEROSCOPY N/A 07/05/2021   Procedure: ENTEROSCOPY;  Surgeon: Carol Ada, MD;  Location: [place];  Service: Gastroenterology;  Laterality: N/A;   ENTEROSCOPY N/A 08/06/2021   Procedure: ENTEROSCOPY;  Surgeon: [name], MD;  Location: [facility] ENDOSCOPY;  Service: Gastroenterology;  Laterality: N/A;   ENTEROSCOPY N/A 08/24/2021   Procedure: ENTEROSCOPY;  Surgeon: Ladene Artist, MD;  Location: [facility] ENDOSCOPY;  Service: Gastroenterology;  Laterality: N/A;   ENTEROSCOPY N/A 09/15/2021   Procedure: ENTEROSCOPY;  Surgeon: Sharyn Creamer, MD;  Location: [facility] ENDOSCOPY;  Service: Gastroenterology;  Laterality: N/A;   ENTEROSCOPY N/A 10/06/2021   Procedure: ENTEROSCOPY;  Surgeon: [name], MD;  Location: [place] ENDOSCOPY;  Service: Gastroenterology;  Laterality: N/A;   ESOPHAGOGASTRODUODENOSCOPY N/A 08/10/2020   Procedure: ESOPHAGOGASTRODUODENOSCOPY (EGD);  Surgeon: Jerene Bears, MD;  Location: [facility] ENDOSCOPY;  Service: Gastroenterology;  Laterality: N/A;   ESOPHAGOGASTRODUODENOSCOPY (EGD) WITH PROPOFOL N/A 06/30/2019   Procedure: ESOPHAGOGASTRODUODENOSCOPY (EGD) WITH PROPOFOL;  Surgeon: Ronald Lobo, MD;  Location: [place];  Service: Endoscopy;  Laterality: N/A;   ESOPHAGOGASTRODUODENOSCOPY (EGD) WITH PROPOFOL N/A 01/12/2021   Procedure: ESOPHAGOGASTRODUODENOSCOPY (EGD) WITH PROPOFOL;  Surgeon: Sharyn Creamer, MD;  Location: [place];  Service: Gastroenterology;  Laterality: N/A;   ESOPHAGOGASTRODUODENOSCOPY (EGD) WITH PROPOFOL N/A 02/08/2021   Procedure: ESOPHAGOGASTRODUODENOSCOPY (EGD) WITH PROPOFOL;  Surgeon: Sharyn Creamer, MD;  Location: [place];  Service: Gastroenterology;  Laterality: N/A;   FISTULA SUPERFICIALIZATION Left 10/17/2019   Procedure: LEFT UPPER EXTREMITY FISTULA REVISION, SIDE BRANCH LIGATION,  AND SUPERFICIALIZATION;  Surgeon: Marty Heck, MD;  Location: [place];  Service: Vascular;  Laterality: Left;   FISTULA SUPERFICIALIZATION Left 01/77/9390   Procedure: PLICATION OF ANEURYSM LEFT ARTERIOVENOUS FISTULA;  Surgeon: Angelia Mould, MD;  Location: [place];  Service: Vascular;  Laterality: Left;   FLEXIBLE SIGMOIDOSCOPY N/A 07/05/2021   Procedure: FLEXIBLE SIGMOIDOSCOPY;  Surgeon: Carol Ada, MD;  Location: [place];  Service: Gastroenterology;  Laterality: N/A;   GIVENS CAPSULE STUDY N/A 06/30/2019   Procedure: GIVENS CAPSULE STUDY;  Surgeon: Ronald Lobo, MD;  Location: [place];  Service: Endoscopy;  Laterality: N/A;   GIVENS CAPSULE STUDY N/A 08/25/2021   Procedure: GIVENS CAPSULE STUDY;  Surgeon: Ladene Artist, MD;  Location: [place];  Service: Gastroenterology;  Laterality:  N/A;   GIVENS CAPSULE STUDY N/A  10/06/2021   Procedure: GIVENS CAPSULE STUDY;  Surgeon: [name], MD;  Location: [place];  Service: Gastroenterology;  Laterality: N/A;   HEMOSTASIS CLIP PLACEMENT  08/10/2020   Procedure: HEMOSTASIS CLIP PLACEMENT;  Surgeon: Jerene Bears, MD;  Location: [place];  Service: Gastroenterology;;   HEMOSTASIS CLIP PLACEMENT  01/12/2021   Procedure: HEMOSTASIS CLIP PLACEMENT;  Surgeon: Sharyn Creamer, MD;  Location: [place];  Service: Gastroenterology;;   HEMOSTASIS CONTROL  08/02/2019   Procedure: HEMOSTASIS CONTROL;  Surgeon: Yetta Flock, MD;  Location: [place];  Service: Gastroenterology;;   HOT HEMOSTASIS  02/24/2021   Procedure: HOT HEMOSTASIS (ARGON PLASMA COAGULATION/BICAP);  Surgeon: [name], MD;  Location: [facility] ENDOSCOPY;  Service: Gastroenterology;;   HOT HEMOSTASIS N/A 03/26/2021   Procedure: HOT HEMOSTASIS (ARGON PLASMA COAGULATION/BICAP);  Surgeon: Lavena Bullion, DO;  Location: [facility] ENDOSCOPY;  Service: Gastroenterology;  Laterality: N/A;   HOT HEMOSTASIS N/A 07/05/2021   Procedure: HOT HEMOSTASIS (ARGON PLASMA COAGULATION/BICAP);  Surgeon: Carol Ada, MD;  Location: [place];  Service: Gastroenterology;  Laterality: N/A;   HOT HEMOSTASIS N/A 09/15/2021   Procedure: HOT HEMOSTASIS (ARGON PLASMA COAGULATION/BICAP);  Surgeon: Sharyn Creamer, MD;  Location: [place];  Service: Gastroenterology;  Laterality: N/A;   HOT HEMOSTASIS N/A 10/06/2021   Procedure: HOT HEMOSTASIS (ARGON PLASMA COAGULATION/BICAP);  Surgeon: [name], MD;  Location: [place];  Service: Gastroenterology;  Laterality: N/A;   INCISION AND DRAINAGE ABSCESS N/A 06/29/2016   Procedure: INCISION AND DRAINAGE ABDOMINAL WALL ABSCESS;  Surgeon: Alphonsa Overall, MD;  Location: [facility];  Service: General;  Laterality: N/A;   INSERTION OF DIALYSIS CATHETER Right 08/03/2019   Procedure: INSERTION OF DIALYSIS CATHETER;  Surgeon: Waynetta Sandy, MD;  Location:  [place];  Service: Vascular;  Laterality: Right;   INSERTION OF DIALYSIS CATHETER Right 10/22/2019   Procedure: INSERTION OF 23CM TUNNELED DIALYSIS CATHETER RIGHT INTERNAL JUGULAR;  Surgeon: Angelia Mould, MD;  Location: [place];  Service: Vascular;  Laterality: Right;   INSERTION OF DIALYSIS CATHETER Right 08/12/2020   Procedure: INSERTION OF Right internal Jugular TUNNELED  DIALYSIS CATHETER.;  Surgeon: Waynetta Sandy, MD;  Location: [place];  Service: Vascular;  Laterality: Right;   INSERTION OF DIALYSIS CATHETER N/A 04/19/2021   Procedure: INSERTION OF TUNNELED DIALYSIS CATHETER;  Surgeon: Angelia Mould, MD;  Location: [facility] OR;  Service: Vascular;  Laterality: N/A;   IR RADIOLOGIST EVAL & MGMT  09/12/2021   Left heel surgery     LOWER EXTREMITY ANGIOGRAPHY N/A 07/08/2021   Procedure: Lower Extremity Angiography;  Surgeon: Cherre Robins, MD;  Location: [place] CV LAB;  Service: Cardiovascular;  Laterality: N/A;   PENILE BIOPSY N/A 03/26/2020   Procedure: PENILE ULCER DEBRIDEMENT;  Surgeon: Remi Haggard, MD;  Location[facility];  Service: Urology;  Laterality: N/A;  30 MINS   PERIPHERAL VASCULAR INTERVENTION Right 07/08/2021   Procedure: PERIPHERAL VASCULAR INTERVENTION;  Surgeon: Cherre Robins, MD;  Location: [place] CV LAB;  Service: Cardiovascular;  Laterality: Right;   SCLEROTHERAPY  01/12/2021   Procedure: SCLEROTHERAPY;  Surgeon: Sharyn Creamer, MD;  Location: [facility] ENDOSCOPY;  Service: Gastroenterology;;   Clide Deutscher  02/24/2021   Procedure: Clide Deutscher;  Surgeon: [name], MD;  Location: [place];  Service: Gastroenterology;;   SUBMUCOSAL TATTOO INJECTION  08/24/2021   Procedure: SUBMUCOSAL TATTOO INJECTION;  Surgeon: Ladene Artist, MD;  Location: [place];  Service: Gastroenterology;;  SUBMUCOSAL TATTOO INJECTION  09/15/2021   Procedure: SUBMUCOSAL TATTOO INJECTION;  Surgeon: Sharyn Creamer, MD;  Location: [facility] ENDOSCOPY;  Service:  Gastroenterology;;     Allergies: Seroquel [quetiapine] and Dilaudid [hydromorphone hcl]  Medications: Prior to Admission medications   Medication Sig Start Date End Date Taking? Authorizing Provider  amLODipine (NORVASC) 5 MG tablet Take 1 tablet (5 mg total) by mouth daily. 09/18/21 12/17/21 Yes [place] Eric J, DO  atorvastatin (LIPITOR) 40 MG tablet Take 1 tablet (40 mg total) by mouth daily. 09/18/21 12/17/21 Yes [place] Eric J, DO  gabapentin (NEURONTIN) 100 MG capsule Take 1 capsule (100 mg total) by mouth 3 (three) times daily. 09/18/21 12/17/21 Yes [place] Donnamarie Poag, DO  loperamide (IMODIUM A-D) 2 MG tablet Take 1 tablet (2 mg total) by mouth 4 (four) times daily as needed for diarrhea or loose stools. 10/22/21  Yes [name], NP  pantoprazole (PROTONIX) 40 MG tablet Take 1 tablet (40 mg total) by mouth 2 (two) times daily before a meal. 10/10/21 01/08/22 Yes Gonfa, Charlesetta Ivory, MD  traZODone (DESYREL) 50 MG tablet Take 1 tablet (50 mg total) by mouth at bedtime as needed for sleep. 10/22/21  Yes [name], NP  colchicine 0.6 MG tablet Take 0.5 tablets (0.3 mg total) by mouth 2 (two) times daily. 07/24/20 07/29/20  Noemi Chapel, MD  furosemide (LASIX) 40 MG tablet Take 1 tablet (40 mg total) by mouth daily. 07/25/19 02/09/20  Ladona Horns, MD     Vital Signs: BP (!) 152/86   Pulse 83   Temp 98.1 F (36.7 C) (Oral)   Resp 18   Ht '5\' 11"'$  (1.803 m)   Wt 220 lb (99.8 kg)   SpO2 99%   BMI 30.68 kg/m   Physical Exam: awake, alert.  Chest with diminished breath sounds bases.  Heart with regular rate and rhythm.  Abdomen soft, positive bowel sounds, nontender.  No significant right lower extremity edema.  Left BKA.  Left upper arm AV fistula with good thrill/bruit.  Imaging: No results found.  Labs:  CBC: Recent Labs    10/09/21 0201 10/10/21 0135 10/17/21 1128 10/24/21 0812  WBC 4.6 5.2 6.4 6.1  HGB 8.0* 8.8* 8.5* 8.9*  HCT 23.7* 25.6* 25.1* 26.7*  PLT 144* 141*  157 161    COAGS: Recent Labs    09/14/21 2213 09/28/21 0729 10/05/21 0729 10/24/21 0812  INR 1.1 1.0 1.1 1.0    BMP: Recent Labs    10/08/21 0424 10/09/21 0201 10/10/21 0135 10/24/21 0812  NA 135 132* 133* 142  K 4.3 3.4* 3.6 3.7  CL 98 94* 93* 95*  CO2 '24 29 26 27  '$ GLUCOSE 100* 101* 111* 94  BUN 29* 15 12 26*  CALCIUM 8.5* 8.0* 8.3* 9.5  CREATININE 5.37* 3.78* 3.45* 5.72*  GFRNONAA 11* 17* 19* 11*    LIVER FUNCTION TESTS: Recent Labs    10/03/21 0900 10/05/21 0729 10/06/21 0447 10/07/21 0244 10/08/21 0424 10/09/21 0201 10/10/21 0135 10/24/21 0812  BILITOT 0.7 0.8 0.8  --   --   --   --  0.8  AST 38 46* 48*  --   --   --   --  40  ALT '11 16 15  '$ --   --   --   --  15  ALKPHOS 144* 124 110  --   --   --   --  152*  PROT 8.3* 7.5 7.1  --   --   --   --  9.1*  ALBUMIN 3.2* 2.3* 2.3*   < > 2.5* 2.4* 2.5* 2.8*   < > = values in this interval not displayed.    Assessment and Plan: Patient known to IR service from consultation with Dr. Serafina Royals on 09/12/2021 to discuss treatment options for multifocal hepatocellular carcinoma.  He has a past medical history significant for anemia, diabetes, nephrolithiasis, cholelithiasis, end-stage renal disease, hypertension, prior intracerebral hemorrhage in 2019, peripheral arterial disease with prior left BKA, hepatitis C and cirrhosis. Latest CT chest abdomen pelvis performed on 10/09/2021 revealed:  1. Extensive multifocal asymmetric pulmonary consolidation and ground-glass pulmonary infiltrate has progressed within the mid and lower lung zones most in keeping with progressive pneumonic infiltrate in the acute setting. 2. Small bilateral pleural effusions have developed. 3. Unchanged mild peripancreatic edema within the pancreatico duodenal groove, which could reflect changes of mild edematous/interstitial pancreatitis. 4. Known central hepatic masses, previously characterized as LI-RADS 5 lesions, are not well delineated or  characterized on this noncontrast examination. 5. Cirrhosis. 6. Cholelithiasis. 7. Nonobstructing left nephrolithiasis.   Aortic Atherosclerosis    Following discussions with Dr. Serafina Royals patient was deemed an appropriate candidate for hepatic Y90 radioembolization .  He presented to [facility] short stay center on 6/29 for arterial roadmapping study, however procedure could not be done because patient drove himself and was unable to find a ride home afterwards.  He presents again today to undergo arterial roadmapping study-test Y90 dosing.Risks and benefits of procedure were discussed with the patient including, but not limited to bleeding, infection, vascular injury or contrast induced renal failure.  This interventional procedure involves the use of X-rays and because of the nature of the planned procedure, it is possible that we will have prolonged use of X-ray fluoroscopy.  Potential radiation risks to you include (but are not limited to) the following: - A slightly elevated risk for cancer  several years later in life. This risk is typically less than 0.5% percent. This risk is low in comparison to the normal incidence of human cancer, which is 33% for women and 50% for men according to the [place]. - Radiation induced injury can include skin redness, resembling a rash, tissue breakdown / ulcers and hair loss (which can be temporary or permanent).   The likelihood of either of these occurring depends on the difficulty of the procedure and whether you are sensitive to radiation due to previous procedures, disease, or genetic conditions.   IF your procedure requires a prolonged use of radiation, you will be notified and given written instructions for further action.  It is your responsibility to monitor the irradiated area for the 2 weeks following the procedure and to notify your physician if you are concerned that you have suffered a radiation induced injury.     All of the patient's questions were answered, patient is agreeable to proceed.  Consent signed and in chart.  LABS PENDING    Electronically Signed: D. Rowe Robert, PA-C 11/07/2021, 8:39 AM   I spent a total of 20 minutes at the the patient's bedside AND on the patient's hospital floor or unit, greater than 50% of which was counseling/coordinating care for visceral/hepatic arteriogram with possible embolization, test Y90 dosing

## 2021-11-07 NOTE — Discharge Instructions (Addendum)
Discharge Instructions: Please call Interventional Radiology clinic [phone]  or [phone] with any questions or concerns.  You may remove your dressing and shower tomorrow.    Pre Y90-Hepatic Artery Radioembolization, Care After This sheet gives you information about how to care for yourself after your procedure. Your health care provider may also give you more specific instructions. If you have problems or questions, contact your health care provider. What can I expect after the procedure? After the procedure, it is common to have: A slight fever for 1-2 weeks. If your fever gets worse, tell your health care provider. Fatigue. Loss of appetite. This should gradually improve after about 1 week. Abdominal pain on your right side. Soreness and tenderness in your groin area where the needle and catheter were placed (puncture site). Follow these instructions at home:  Puncture site care Follow instructions from your health care provider about how to take care of the puncture site. Make sure you: Wash your hands with soap and water before you change your bandage (dressing). If soap and water are not available, use hand sanitizer. Change your dressing as told by your health care provider. Leave stitches (sutures), skin glue, or adhesive strips in place. These skin closures may need to stay in place for 2 weeks or longer. If adhesive strip edges start to loosen and curl up, you may trim the loose edges. Do not remove adhesive strips completely unless your health care provider tells you to do that. Check your puncture site every day for signs of infection. Check for: More redness, swelling, or pain. More fluid or blood. Warmth. Pus or a bad smell. Activity Rest and return to your normal activities as told by your health care provider. Ask your health care provider what activities are safe for you. Do not drive for 24 hours after the procedure if you were given a medicine to help you  relax (sedative). Do not lift anything that is heavier than 10 lb (4.5 kg) until your health care provider says that it is safe. Medicines Take over-the-counter and prescription medicines only as told by your health care provider. Do not drive or use heavy machinery while taking prescription pain medicine. Radiation precautions For up to a week after your procedure, there will be a small amount of radioactivity near your liver. This is not especially dangerous to other people. However, you should follow these precautions for 7 days: Do not come in close contact with people. Do not sleep in the same bed as someone else. Do not hold children or babies. Do not have contact with pregnant women. General instructions To prevent or treat constipation while you are taking prescription pain medicine, your health care provider may recommend that you: Drink enough fluid to keep your urine clear or pale yellow. Take over-the-counter or prescription medicines. Eat foods that are high in fiber, such as fresh fruits and vegetables, whole grains, and beans. Limit foods that are high in fat and processed sugars, such as fried and sweet foods. Eat frequent small meals until your appetite returns. Follow instructions from your health care provider about eating or drinking restrictions. Do not take baths, swim, or use a hot tub until your health care provider approves. You may take showers. Wash your puncture site with mild soap and water and pat the area dry. Wear compression stockings as told by your health care provider. These stockings help to prevent blood clots and reduce swelling in your legs. Keep all follow-up visits as told by your  health care provider. This is important. You may need to have blood tests and imaging tests done. Contact a health care provider if: You have more redness, swelling, or pain around your puncture site. You have more fluid or blood coming from your puncture site. Your puncture  site feels warm to the touch. You have pus or a bad smell coming from your puncture site. You have pain that: Gets worse. Does not get better with medicine. Feels like very bad heartburn. Is in the middle of your abdomen, above your belly button. Your skin and the white parts of your eyes turn yellow (jaundice). The color of your urine changes to dark brown. The color of your stool changes to light yellow. Your abdominal measurement (girth) increases in a short period of time. You gain more than 5 lb (2.3 kg) in a short period of time. Get help right away if: You have a fever that lasts longer than 2 weeks or is higher than what your health care provider told you to expect. You develop any of the following in your legs: Pain. Swelling. Skin that is cold or pale or turns blue. You have chest pain. You have blood in your vomit, saliva, or stool. You have trouble breathing. This information is not intended to replace advice given to you by your health care provider. Make sure you discuss any questions you have with your   Radiation precautions after next procedure    Post Y-90 Radioembolization Discharge Instructions  You have been given a radioactive material during your procedure.  While it is safe for you to be discharged home from the hospital, you need to proceed directly home.    Do not use public transportation, including air travel, lasting more than 2 hours for 1 week.  Avoid crowded public places for 1 week.  Adult visitors should try to avoid close contact with you for 1 week.    Children and pregnant females should not visit or have close contact with you for 1 week.  Items that you touch are not radioactive.  Do not sleep in the same bed as your partner for 1 week, and a condom should be used for sexual activity during the first 24 hours.  Your blood may be radioactive and caution should be used if any bleeding occurs during the recovery period.  Body fluids may  be radioactive for 24 hours.  Wash your hands after voiding.  Men should sit to urinate.  Dispose of any soiled materials (flush down toilet or place in trash at home) during the first day.  Drink 6 to 8 glasses of fluids per day for 5 days to hydrate yourself.  If you need to see a doctor during the first week, you must let them know that you were treated with yttrium-90 microspheres, and will be slightly radioactive.  They can call Interventional Radiology [phone] with any questions.    Moderate Conscious Sedation, Adult, Care After This sheet gives you information about how to care for yourself after your procedure. Your health care provider may also give you more specific instructions. If you have problems or questions, contact your health careprovider. What can I expect after the procedure? After the procedure, it is common to have: Sleepiness for several hours. Impaired judgment for several hours. Difficulty with balance. Vomiting if you eat too soon. Follow these instructions at home: For the time period you were told by your health care provider: Rest. Do not participate in activities where you could  fall or become injured. Do not drive or use machinery. Do not drink alcohol. Do not take sleeping pills or medicines that cause drowsiness. Do not make important decisions or sign legal documents. Do not take care of children on your own. Eating and drinking  Follow the diet recommended by your health care provider. Drink enough fluid to keep your urine pale yellow. If you vomit: Drink water, juice, or soup when you can drink without vomiting. Make sure you have little or no nausea before eating solid foods.  General instructions Take over-the-counter and prescription medicines only as told by your health care provider. Have a responsible adult stay with you for the time you are told. It is important to have someone help care for you until you are awake and alert. Do not  smoke. Keep all follow-up visits as told by your health care provider. This is important. Contact a health care provider if: You are still sleepy or having trouble with balance after 24 hours. You feel light-headed. You keep feeling nauseous or you keep vomiting. You develop a rash. You have a fever. You have redness or swelling around the IV site. Get help right away if: You have trouble breathing. You have new-onset confusion at home. Summary After the procedure, it is common to feel sleepy, have impaired judgment, or feel nauseous if you eat too soon. Rest after you get home. Know the things you should not do after the procedure. Follow the diet recommended by your health care provider and drink enough fluid to keep your urine pale yellow. Get help right away if you have trouble breathing or new-onset confusion at home. This information is not intended to replace advice given to you by your health care provider. Make sure you discuss any questions you have with your healthcare provider. Document Revised: 08/12/2019 Document Reviewed: 03/10/2019 Elsevier Patient Education  2022 [place]  This sheet gives you information about how to care for yourself after your procedure. Your health care provider may also give you more specific instructions. If you have problems or questions, contact your health care provider. What can I expect after the procedure? After the procedure, it is common to have: Bruising and tenderness at the catheter insertion area. Follow these instructions at home: Medicines Take over-the-counter and prescription medicines only as told by your health care provider. Insertion site care Follow instructions from your health care provider about how to take care of your insertion site. Make sure you: Wash your hands with soap and water before you change your bandage (dressing). If soap and water are not available, use hand sanitizer. Change your  dressing as told by your health care provider. Leave stitches (sutures), skin glue, or adhesive strips in place. These skin closures may need to stay in place for 2 weeks or longer. If adhesive strip edges start to loosen and curl up, you may trim the loose edges. Do not remove adhesive strips completely unless your health care provider tells you to do that. Check your insertion site every day for signs of infection. Check for: Redness, swelling, or pain. Fluid or blood. Pus or a bad smell. Warmth. Do not take baths, swim, or use a hot tub until your health care provider approves. You may shower 24-48 hours after the procedure, or as directed by your health care provider. Remove the dressing and gently wash the site with plain soap and water. Pat the area dry with a clean towel. Do not  rub the site. That could cause bleeding. Do not apply powder or lotion to the site. Activity  For 24 hours after the procedure, or as directed by your health care provider: Do not flex or bend the affected arm. Do not push or pull heavy objects with the affected arm. Do not drive yourself home from the hospital or clinic. You may drive 24 hours after the procedure unless your health care provider tells you not to. Do not operate machinery or power tools. Do not lift anything that is heavier than 10 lb (4.5 kg), or the limit that you are told, until your health care provider says that it is safe. Ask your health care provider when it is okay to: Return to work or school. Resume usual physical activities or sports. Resume sexual activity. General instructions If the catheter site starts to bleed, raise your arm and put firm pressure on the site. If the bleeding does not stop, get help right away. This is a medical emergency. If you went home on the same day as your procedure, a responsible adult should be with you for the first 24 hours after you arrive home. Keep all follow-up visits as told by your health  care provider. This is important. Contact a health care provider if: You have a fever. You have redness, swelling, or yellow drainage around your insertion site. Get help right away if: You have unusual pain at the radial site. The catheter insertion area swells very fast. The insertion area is bleeding, and the bleeding does not stop when you hold steady pressure on the area. Your arm or hand becomes pale, cool, tingly, or numb. These symptoms may represent a serious problem that is an emergency. Do not wait to see if the symptoms will go away. Get medical help right away. Call your local emergency services (911 in the [place] Do not drive yourself to the hospital. Summary After the procedure, it is common to have bruising and tenderness at the site. Follow instructions from your health care provider about how to take care of your radial site wound. Check the wound every day for signs of infection. Do not lift anything that is heavier than 10 lb (4.5 kg), or the limit that you are told, until your health care provider says that it is safe. This information is not intended to replace advice given to you by your health care provider. Make sure you discuss any questions you have with your health care provider. Document Revised: 05/20/2017 Document Reviewed: 05/20/2017 Elsevier Patient Education  2020 [employer].

## 2021-11-12 ENCOUNTER — Inpatient Hospital Stay (HOSPITAL_COMMUNITY)
Admission: EM | Admit: 2021-11-12 | Discharge: 2021-11-15 | DRG: 377 | Disposition: A | Discharge status: Home / Self Care | Payer: Medicare Other | Attending: Family Medicine | Admitting: Family Medicine

## 2021-11-12 ENCOUNTER — Other Ambulatory Visit: Payer: Self-pay

## 2021-11-12 ENCOUNTER — Encounter (HOSPITAL_COMMUNITY): Payer: Self-pay | Admitting: Emergency Medicine

## 2021-11-12 DIAGNOSIS — R9431 Abnormal electrocardiogram [ECG] [EKG]: Secondary | ICD-10-CM | POA: Diagnosis present

## 2021-11-12 DIAGNOSIS — B182 Chronic viral hepatitis C: Secondary | ICD-10-CM | POA: Diagnosis present

## 2021-11-12 DIAGNOSIS — N186 End stage renal disease: Secondary | ICD-10-CM | POA: Diagnosis present

## 2021-11-12 DIAGNOSIS — E1142 Type 2 diabetes mellitus with diabetic polyneuropathy: Secondary | ICD-10-CM

## 2021-11-12 DIAGNOSIS — N2581 Secondary hyperparathyroidism of renal origin: Secondary | ICD-10-CM | POA: Diagnosis present

## 2021-11-12 DIAGNOSIS — D631 Anemia in chronic kidney disease: Secondary | ICD-10-CM | POA: Diagnosis present

## 2021-11-12 DIAGNOSIS — D62 Acute posthemorrhagic anemia: Secondary | ICD-10-CM | POA: Diagnosis not present

## 2021-11-12 DIAGNOSIS — D509 Iron deficiency anemia, unspecified: Secondary | ICD-10-CM | POA: Diagnosis present

## 2021-11-12 DIAGNOSIS — B1920 Unspecified viral hepatitis C without hepatic coma: Secondary | ICD-10-CM | POA: Diagnosis present

## 2021-11-12 DIAGNOSIS — I120 Hypertensive chronic kidney disease with stage 5 chronic kidney disease or end stage renal disease: Secondary | ICD-10-CM | POA: Diagnosis present

## 2021-11-12 DIAGNOSIS — K2971 Gastritis, unspecified, with bleeding: Secondary | ICD-10-CM | POA: Diagnosis present

## 2021-11-12 DIAGNOSIS — F17210 Nicotine dependence, cigarettes, uncomplicated: Secondary | ICD-10-CM | POA: Diagnosis present

## 2021-11-12 DIAGNOSIS — G4700 Insomnia, unspecified: Secondary | ICD-10-CM

## 2021-11-12 DIAGNOSIS — E1151 Type 2 diabetes mellitus with diabetic peripheral angiopathy without gangrene: Secondary | ICD-10-CM | POA: Diagnosis present

## 2021-11-12 DIAGNOSIS — G894 Chronic pain syndrome: Secondary | ICD-10-CM | POA: Diagnosis present

## 2021-11-12 DIAGNOSIS — D649 Anemia, unspecified: Principal | ICD-10-CM

## 2021-11-12 DIAGNOSIS — K7460 Unspecified cirrhosis of liver: Secondary | ICD-10-CM | POA: Diagnosis present

## 2021-11-12 DIAGNOSIS — Z992 Dependence on renal dialysis: Secondary | ICD-10-CM

## 2021-11-12 DIAGNOSIS — K219 Gastro-esophageal reflux disease without esophagitis: Secondary | ICD-10-CM | POA: Diagnosis present

## 2021-11-12 DIAGNOSIS — Z8673 Personal history of transient ischemic attack (TIA), and cerebral infarction without residual deficits: Secondary | ICD-10-CM

## 2021-11-12 DIAGNOSIS — I8511 Secondary esophageal varices with bleeding: Secondary | ICD-10-CM | POA: Diagnosis present

## 2021-11-12 DIAGNOSIS — E782 Mixed hyperlipidemia: Secondary | ICD-10-CM | POA: Diagnosis present

## 2021-11-12 DIAGNOSIS — Z888 Allergy status to other drugs, medicaments and biological substances status: Secondary | ICD-10-CM

## 2021-11-12 DIAGNOSIS — N485 Ulcer of penis: Secondary | ICD-10-CM | POA: Diagnosis present

## 2021-11-12 DIAGNOSIS — C221 Intrahepatic bile duct carcinoma: Secondary | ICD-10-CM | POA: Diagnosis present

## 2021-11-12 DIAGNOSIS — Z6833 Body mass index (BMI) 33.0-33.9, adult: Secondary | ICD-10-CM

## 2021-11-12 DIAGNOSIS — Z79899 Other long term (current) drug therapy: Secondary | ICD-10-CM

## 2021-11-12 DIAGNOSIS — Z91158 Patient's noncompliance with renal dialysis for other reason: Secondary | ICD-10-CM

## 2021-11-12 DIAGNOSIS — K264 Chronic or unspecified duodenal ulcer with hemorrhage: Secondary | ICD-10-CM | POA: Diagnosis present

## 2021-11-12 DIAGNOSIS — K8689 Other specified diseases of pancreas: Secondary | ICD-10-CM | POA: Diagnosis present

## 2021-11-12 DIAGNOSIS — Z87442 Personal history of urinary calculi: Secondary | ICD-10-CM

## 2021-11-12 DIAGNOSIS — R918 Other nonspecific abnormal finding of lung field: Secondary | ICD-10-CM | POA: Diagnosis present

## 2021-11-12 DIAGNOSIS — M62838 Other muscle spasm: Secondary | ICD-10-CM | POA: Diagnosis present

## 2021-11-12 DIAGNOSIS — K766 Portal hypertension: Secondary | ICD-10-CM | POA: Diagnosis present

## 2021-11-12 DIAGNOSIS — K317 Polyp of stomach and duodenum: Secondary | ICD-10-CM | POA: Diagnosis present

## 2021-11-12 DIAGNOSIS — E669 Obesity, unspecified: Secondary | ICD-10-CM | POA: Diagnosis present

## 2021-11-12 DIAGNOSIS — E1122 Type 2 diabetes mellitus with diabetic chronic kidney disease: Secondary | ICD-10-CM | POA: Diagnosis present

## 2021-11-12 DIAGNOSIS — K31811 Angiodysplasia of stomach and duodenum with bleeding: Secondary | ICD-10-CM | POA: Diagnosis not present

## 2021-11-12 DIAGNOSIS — K2270 Barrett's esophagus without dysplasia: Secondary | ICD-10-CM | POA: Diagnosis present

## 2021-11-12 DIAGNOSIS — D6959 Other secondary thrombocytopenia: Secondary | ICD-10-CM | POA: Diagnosis present

## 2021-11-12 DIAGNOSIS — I10 Essential (primary) hypertension: Secondary | ICD-10-CM | POA: Diagnosis present

## 2021-11-12 DIAGNOSIS — M542 Cervicalgia: Secondary | ICD-10-CM | POA: Diagnosis present

## 2021-11-12 DIAGNOSIS — C220 Liver cell carcinoma: Secondary | ICD-10-CM | POA: Diagnosis present

## 2021-11-12 DIAGNOSIS — Z89512 Acquired absence of left leg below knee: Secondary | ICD-10-CM

## 2021-11-12 LAB — COMPREHENSIVE METABOLIC PANEL
ALT: 17 U/L (ref 0–44)
AST: 47 U/L — ABNORMAL HIGH (ref 15–41)
Albumin: 2.5 g/dL — ABNORMAL LOW (ref 3.5–5.0)
Alkaline Phosphatase: 141 U/L — ABNORMAL HIGH (ref 38–126)
Anion gap: 26 — ABNORMAL HIGH (ref 5–15)
BUN: 59 mg/dL — ABNORMAL HIGH (ref 8–23)
CO2: 17 mmol/L — ABNORMAL LOW (ref 22–32)
Calcium: 8.5 mg/dL — ABNORMAL LOW (ref 8.9–10.3)
Chloride: 93 mmol/L — ABNORMAL LOW (ref 98–111)
Creatinine, Ser: 10.25 mg/dL — ABNORMAL HIGH (ref 0.61–1.24)
GFR, Estimated: 5 mL/min — ABNORMAL LOW (ref >60)
Glucose, Bld: 87 mg/dL (ref 70–99)
Potassium: 4.2 mmol/L (ref 3.5–5.1)
Sodium: 136 mmol/L (ref 135–145)
Total Bilirubin: 1.2 mg/dL (ref 0.3–1.2)
Total Protein: 8.2 g/dL — ABNORMAL HIGH (ref 6.5–8.1)

## 2021-11-12 LAB — CBC
HCT: 21.8 % — ABNORMAL LOW (ref 39.0–52.0)
Hemoglobin: 7.2 g/dL — ABNORMAL LOW (ref 13.0–17.0)
MCH: 33.2 pg (ref 26.0–34.0)
MCHC: 33 g/dL (ref 30.0–36.0)
MCV: 100.5 fL — ABNORMAL HIGH (ref 80.0–100.0)
Platelets: 109 10*3/uL — ABNORMAL LOW (ref 150–400)
RBC: 2.17 MIL/uL — ABNORMAL LOW (ref 4.22–5.81)
RDW: 17.4 % — ABNORMAL HIGH (ref 11.5–15.5)
WBC: 4.5 10*3/uL (ref 4.0–10.5)
nRBC: 0 % (ref 0.0–0.2)

## 2021-11-12 LAB — POC OCCULT BLOOD, ED: Fecal Occult Bld: POSITIVE — AB

## 2021-11-12 MED ORDER — METHOCARBAMOL 1000 MG/10ML IJ SOLN
500.0000 mg | Freq: Once | INTRAVENOUS | Status: AC
  Administered 2021-11-12: 500 mg via INTRAVENOUS
  Filled 2021-11-12: qty 5

## 2021-11-12 MED ORDER — DIAZEPAM 2 MG PO TABS
2.0000 mg | ORAL_TABLET | Freq: Once | ORAL | Status: AC
  Administered 2021-11-12: 2 mg via ORAL
  Filled 2021-11-12: qty 1

## 2021-11-12 MED ORDER — FENTANYL CITRATE PF 50 MCG/ML IJ SOSY
50.0000 ug | PREFILLED_SYRINGE | Freq: Once | INTRAMUSCULAR | Status: AC
  Administered 2021-11-12: 50 ug via INTRAVENOUS
  Filled 2021-11-12: qty 1

## 2021-11-12 MED ORDER — PANTOPRAZOLE SODIUM 40 MG IV SOLR
40.0000 mg | Freq: Two times a day (BID) | INTRAVENOUS | Status: DC
  Administered 2021-11-12: 40 mg via INTRAVENOUS
  Filled 2021-11-12: qty 10

## 2021-11-12 MED ORDER — METHOCARBAMOL 1000 MG/10ML IJ SOLN: 500.0000 mg | Freq: Once | INTRAMUSCULAR | Status: DC

## 2021-11-12 NOTE — ED Triage Notes (Signed)
C/o neck pain x 2 days.  No known injury.  Also reports black stools x 2 days with generalized abd pain.  No blood thinners.

## 2021-11-12 NOTE — ED Provider Triage Note (Signed)
Emergency Medicine Provider Triage Evaluation Note  [name] , a 61 y.o. male  was evaluated in triage.  Pt complains of upper neck and shoulder pain going on for several days, history of same, mild headache.  Patient also reports that he thinks he has a GI bleed because he is having black stools, patient is not on any blood thinners, history of end-stage renal disease last dialysis was this past Friday, 5 days ago.  He missed meant Monday..  Review of Systems  Positive: Neck pain Negative: Abdominal pain  Physical Exam  BP (!) 154/92 (BP Location: Right Arm)   Pulse 85   Temp 98 F (36.7 C) (Oral)   Resp 16   SpO2 98%  Gen:   Awake, no distress   Resp:  Normal effort  MSK:   Moves extremities without difficulty  Other:  Tender to palpation upper trapezius fibers  Medical Decision Making  Medically screening exam initiated at 8:57 AM.  Appropriate orders placed.  Trygg C Mino was informed that the remainder of the evaluation will be completed by another provider, this initial triage assessment does not replace that evaluation, and the importance of remaining in the ED until their evaluation is complete.  Work-up initiated   Margarita Mail, PA-C 11/12/21 6295

## 2021-11-12 NOTE — ED Provider Notes (Signed)
[facility] EMERGENCY DEPARTMENT Provider Note   CSN: [id number] Arrival date & time: 11/12/21  0350     History  Chief Complaint  Patient presents with   Neck Pain   Melena    Daniel [name] is a 61 y.o. male.  Presenting to ER due to 2 different complaints.  He says yesterday he had a black appearing stool.  No more stools today.  He denies any abdominal pain nausea or vomiting.  He denies being on a blood thinner.  Per review of chart has had prior GI bleeding events.  On secondary complaint patient also having some pain in his upper back, neck, base of head.  States that it feels aching, spasms.  Worse with certain positions.  No obvious alleviating factors.  No numbness or weakness.  No chest pain.  Completed extensive chart review, reviewed last GI enteroscopy report, reviewed last discharge summary from hospitalist service in June -  ESRD on HD MWF, IDA, recurrent GIB, duodenal ulcer, gastric ulcer, small bowel AVM, PAD, chronic pain syndrome, DM-2, orthostatic hypotension, EtOH use, tobacco use disorder, anxiety, left BKA, QTc   HPI     Home Medications Prior to Admission medications   Medication Sig Start Date End Date Taking? Authorizing Provider  amLODipine (NORVASC) 5 MG tablet Take 1 tablet (5 mg total) by mouth daily. 09/18/21 12/17/21  [place] Donnamarie Poag, DO  atorvastatin (LIPITOR) 40 MG tablet Take 1 tablet (40 mg total) by mouth daily. 09/18/21 12/17/21  [place] Donnamarie Poag, DO  gabapentin (NEURONTIN) 300 MG capsule Take 1 capsule (300 mg total) by mouth 3 (three) times daily. 11/07/21   [name], NP  loperamide (IMODIUM A-D) 2 MG tablet Take 1 tablet (2 mg total) by mouth 4 (four) times daily as needed for diarrhea or loose stools. 10/22/21   [name], NP  pantoprazole (PROTONIX) 40 MG tablet Take 1 tablet (40 mg total) by mouth 2 (two) times daily before a meal. 10/10/21 01/08/22  Mercy Riding, MD  traZODone (DESYREL) 50 MG tablet Take  1 tablet (50 mg total) by mouth at bedtime as needed for sleep. 10/22/21   [name], NP  colchicine 0.6 MG tablet Take 0.5 tablets (0.3 mg total) by mouth 2 (two) times daily. 07/24/20 07/29/20  Noemi Chapel, MD  furosemide (LASIX) 40 MG tablet Take 1 tablet (40 mg total) by mouth daily. 07/25/19 02/09/20  Ladona Horns, MD      Allergies    Seroquel [quetiapine] and Dilaudid [hydromorphone hcl]    Review of Systems   Review of Systems  Constitutional:  Negative for chills and fever.  HENT:  Negative for ear pain and sore throat.   Eyes:  Negative for pain and visual disturbance.  Respiratory:  Negative for cough and shortness of breath.   Cardiovascular:  Negative for chest pain and palpitations.  Gastrointestinal:  Negative for abdominal pain and vomiting.  Genitourinary:  Negative for dysuria and hematuria.  Musculoskeletal:  Positive for back pain and neck pain. Negative for arthralgias.  Skin:  Negative for color change and rash.  Neurological:  Negative for seizures and syncope.  All other systems reviewed and are negative.   Physical Exam Updated Vital Signs BP (!) 130/111 (BP Location: Right Leg)   Pulse 81   Temp 97.9 F (36.6 C) (Oral)   Resp 18   Ht '5\' 11"'$  (1.803 m)   Wt 108.9 kg   SpO2 100%   BMI 33.47  kg/m  Physical Exam Vitals and nursing note reviewed.  Constitutional:      General: He is not in acute distress.    Appearance: He is well-developed.  HENT:     Head: Normocephalic and atraumatic.  Eyes:     Conjunctiva/sclera: Conjunctivae normal.  Cardiovascular:     Rate and Rhythm: Normal rate and regular rhythm.     Pulses: Normal pulses.  Pulmonary:     Effort: Pulmonary effort is normal. No respiratory distress.     Breath sounds: Normal breath sounds.  Abdominal:     Palpations: Abdomen is soft.     Tenderness: There is no abdominal tenderness.  Genitourinary:    Comments: Chaperone present, stool brown Musculoskeletal:        General:  No swelling or deformity.     Cervical back: Neck supple.  Skin:    General: Skin is warm and dry.     Capillary Refill: Capillary refill takes less than 2 seconds.  Neurological:     General: No focal deficit present.     Mental Status: He is alert.  Psychiatric:        Mood and Affect: Mood normal.     ED Results / Procedures / Treatments   Labs (all labs ordered are listed, but only abnormal results are displayed) Labs Reviewed  COMPREHENSIVE METABOLIC PANEL - Abnormal; Notable for the following components:      Result Value   Chloride 93 (*)    CO2 17 (*)    BUN 59 (*)    Creatinine, Ser 10.25 (*)    Calcium 8.5 (*)    Total Protein 8.2 (*)    Albumin 2.5 (*)    AST 47 (*)    Alkaline Phosphatase 141 (*)    GFR, Estimated 5 (*)    Anion gap 26 (*)    All other components within normal limits  CBC - Abnormal; Notable for the following components:   RBC 2.17 (*)    Hemoglobin 7.2 (*)    HCT 21.8 (*)    MCV 100.5 (*)    RDW 17.4 (*)    Platelets 109 (*)    All other components within normal limits  POC OCCULT BLOOD, ED - Abnormal; Notable for the following components:   Fecal Occult Bld POSITIVE (*)    All other components within normal limits  TYPE AND SCREEN    EKG None  Radiology No results found.  Procedures Procedures    Medications Ordered in ED Medications  fentaNYL (SUBLIMAZE) injection 50 mcg (has no administration in time range)  diazepam (VALIUM) tablet 2 mg (has no administration in time range)  pantoprazole (PROTONIX) injection 40 mg (has no administration in time range)  fentaNYL (SUBLIMAZE) injection 50 mcg (50 mcg Intravenous Given 11/12/21 2003)  methocarbamol (ROBAXIN) 500 mg in dextrose 5 % 50 mL IVPB (500 mg Intravenous New Bag/Given 11/12/21 2142)    ED Course/ Medical Decision Making/ A&P                           Medical Decision Making Amount and/or Complexity of Data Reviewed Labs: ordered.  Risk Prescription drug  management. Decision regarding hospitalization.   61 year old male  ESRD on HD MWF, IDA, recurrent GIB, duodenal ulcer, gastric ulcer, small bowel AVM, PAD, chronic pain syndrome, DM-2, orthostatic hypotension, EtOH use, tobacco use disorder, anxiety, left BKA presenting to ER due to concern for black stool episode, neck pain.  Regarding the neck pain, also having some upper back pain.  Tenderness to palpation noted on exam, very reproducible.  Suspect MSK in etiology.  Provided some pain control, analgesia and muscle relaxer.  Regarding the black stool, he has not had any more bowel movements today.  His stool was brown on rectal exam but Hemoccult positive.  His hemoglobin today is 7.2, 2 weeks ago it was 8.9.  Given this significant drop and positive stools, prior GI bleeding, feel he would benefit from admission.  Consulted medicine for admission.  Started IV PPI.  Will send secure chat message to on-call for [facility] who pt has seen previously to request formal GI consult tomorrow morning.          Final Clinical Impression(s) / ED Diagnoses Final diagnoses:  Anemia, unspecified type    Rx / DC Orders ED Discharge Orders     None         [name], MD 11/12/21 2217

## 2021-11-13 ENCOUNTER — Encounter (HOSPITAL_COMMUNITY): Payer: Self-pay | Admitting: Internal Medicine

## 2021-11-13 DIAGNOSIS — D62 Acute posthemorrhagic anemia: Secondary | ICD-10-CM

## 2021-11-13 DIAGNOSIS — C220 Liver cell carcinoma: Secondary | ICD-10-CM | POA: Diagnosis present

## 2021-11-13 DIAGNOSIS — G894 Chronic pain syndrome: Secondary | ICD-10-CM | POA: Diagnosis present

## 2021-11-13 DIAGNOSIS — K766 Portal hypertension: Secondary | ICD-10-CM | POA: Diagnosis present

## 2021-11-13 DIAGNOSIS — K5521 Angiodysplasia of colon with hemorrhage: Secondary | ICD-10-CM

## 2021-11-13 DIAGNOSIS — E782 Mixed hyperlipidemia: Secondary | ICD-10-CM | POA: Diagnosis present

## 2021-11-13 DIAGNOSIS — C221 Intrahepatic bile duct carcinoma: Secondary | ICD-10-CM | POA: Diagnosis present

## 2021-11-13 DIAGNOSIS — K921 Melena: Secondary | ICD-10-CM | POA: Diagnosis not present

## 2021-11-13 DIAGNOSIS — Z992 Dependence on renal dialysis: Secondary | ICD-10-CM | POA: Diagnosis not present

## 2021-11-13 DIAGNOSIS — E669 Obesity, unspecified: Secondary | ICD-10-CM | POA: Diagnosis present

## 2021-11-13 DIAGNOSIS — N186 End stage renal disease: Secondary | ICD-10-CM

## 2021-11-13 DIAGNOSIS — D6959 Other secondary thrombocytopenia: Secondary | ICD-10-CM | POA: Diagnosis present

## 2021-11-13 DIAGNOSIS — K31819 Angiodysplasia of stomach and duodenum without bleeding: Secondary | ICD-10-CM | POA: Diagnosis not present

## 2021-11-13 DIAGNOSIS — M542 Cervicalgia: Secondary | ICD-10-CM | POA: Diagnosis present

## 2021-11-13 DIAGNOSIS — K7469 Other cirrhosis of liver: Secondary | ICD-10-CM

## 2021-11-13 DIAGNOSIS — B182 Chronic viral hepatitis C: Secondary | ICD-10-CM | POA: Diagnosis present

## 2021-11-13 DIAGNOSIS — I10 Essential (primary) hypertension: Secondary | ICD-10-CM | POA: Diagnosis not present

## 2021-11-13 DIAGNOSIS — K264 Chronic or unspecified duodenal ulcer with hemorrhage: Secondary | ICD-10-CM | POA: Diagnosis present

## 2021-11-13 DIAGNOSIS — K3189 Other diseases of stomach and duodenum: Secondary | ICD-10-CM | POA: Diagnosis not present

## 2021-11-13 DIAGNOSIS — K219 Gastro-esophageal reflux disease without esophagitis: Secondary | ICD-10-CM

## 2021-11-13 DIAGNOSIS — I8511 Secondary esophageal varices with bleeding: Secondary | ICD-10-CM | POA: Diagnosis present

## 2021-11-13 DIAGNOSIS — R918 Other nonspecific abnormal finding of lung field: Secondary | ICD-10-CM

## 2021-11-13 DIAGNOSIS — D631 Anemia in chronic kidney disease: Secondary | ICD-10-CM | POA: Diagnosis present

## 2021-11-13 DIAGNOSIS — F17210 Nicotine dependence, cigarettes, uncomplicated: Secondary | ICD-10-CM

## 2021-11-13 DIAGNOSIS — I8501 Esophageal varices with bleeding: Secondary | ICD-10-CM | POA: Diagnosis not present

## 2021-11-13 DIAGNOSIS — I120 Hypertensive chronic kidney disease with stage 5 chronic kidney disease or end stage renal disease: Secondary | ICD-10-CM | POA: Diagnosis present

## 2021-11-13 DIAGNOSIS — K2270 Barrett's esophagus without dysplasia: Secondary | ICD-10-CM | POA: Diagnosis present

## 2021-11-13 DIAGNOSIS — D509 Iron deficiency anemia, unspecified: Secondary | ICD-10-CM | POA: Diagnosis present

## 2021-11-13 DIAGNOSIS — R9431 Abnormal electrocardiogram [ECG] [EKG]: Secondary | ICD-10-CM

## 2021-11-13 DIAGNOSIS — E1122 Type 2 diabetes mellitus with diabetic chronic kidney disease: Secondary | ICD-10-CM | POA: Diagnosis present

## 2021-11-13 DIAGNOSIS — N2581 Secondary hyperparathyroidism of renal origin: Secondary | ICD-10-CM | POA: Diagnosis present

## 2021-11-13 DIAGNOSIS — K2971 Gastritis, unspecified, with bleeding: Secondary | ICD-10-CM | POA: Diagnosis present

## 2021-11-13 DIAGNOSIS — E1151 Type 2 diabetes mellitus with diabetic peripheral angiopathy without gangrene: Secondary | ICD-10-CM | POA: Diagnosis present

## 2021-11-13 DIAGNOSIS — K31811 Angiodysplasia of stomach and duodenum with bleeding: Secondary | ICD-10-CM | POA: Diagnosis present

## 2021-11-13 LAB — COMPREHENSIVE METABOLIC PANEL
ALT: 16 U/L (ref 0–44)
AST: 44 U/L — ABNORMAL HIGH (ref 15–41)
Albumin: 2.4 g/dL — ABNORMAL LOW (ref 3.5–5.0)
Alkaline Phosphatase: 122 U/L (ref 38–126)
Anion gap: 21 — ABNORMAL HIGH (ref 5–15)
BUN: 67 mg/dL — ABNORMAL HIGH (ref 8–23)
CO2: 19 mmol/L — ABNORMAL LOW (ref 22–32)
Calcium: 8.3 mg/dL — ABNORMAL LOW (ref 8.9–10.3)
Chloride: 96 mmol/L — ABNORMAL LOW (ref 98–111)
Creatinine, Ser: 12.18 mg/dL — ABNORMAL HIGH (ref 0.61–1.24)
GFR, Estimated: 4 mL/min — ABNORMAL LOW (ref >60)
Glucose, Bld: 95 mg/dL (ref 70–99)
Potassium: 4.1 mmol/L (ref 3.5–5.1)
Sodium: 136 mmol/L (ref 135–145)
Total Bilirubin: 0.7 mg/dL (ref 0.3–1.2)
Total Protein: 8.1 g/dL (ref 6.5–8.1)

## 2021-11-13 LAB — CBC
HCT: 19.4 % — ABNORMAL LOW (ref 39.0–52.0)
HCT: 23 % — ABNORMAL LOW (ref 39.0–52.0)
Hemoglobin: 6.7 g/dL — CL (ref 13.0–17.0)
Hemoglobin: 7.8 g/dL — ABNORMAL LOW (ref 13.0–17.0)
MCH: 34 pg (ref 26.0–34.0)
MCH: 32.1 pg (ref 26.0–34.0)
MCHC: 34.5 g/dL (ref 30.0–36.0)
MCHC: 33.9 g/dL (ref 30.0–36.0)
MCV: 98.5 fL (ref 80.0–100.0)
MCV: 94.7 fL (ref 80.0–100.0)
Platelets: 108 10*3/uL — ABNORMAL LOW (ref 150–400)
Platelets: 103 10*3/uL — ABNORMAL LOW (ref 150–400)
RBC: 1.97 MIL/uL — ABNORMAL LOW (ref 4.22–5.81)
RBC: 2.43 MIL/uL — ABNORMAL LOW (ref 4.22–5.81)
RDW: 17.6 % — ABNORMAL HIGH (ref 11.5–15.5)
RDW: 18 % — ABNORMAL HIGH (ref 11.5–15.5)
WBC: 4 10*3/uL (ref 4.0–10.5)
WBC: 3.6 10*3/uL — ABNORMAL LOW (ref 4.0–10.5)
nRBC: 0 % (ref 0.0–0.2)
nRBC: 0 % (ref 0.0–0.2)

## 2021-11-13 LAB — PROTIME-INR
INR: 1.1 (ref 0.8–1.2)
Prothrombin Time: 14.3 seconds (ref 11.4–15.2)

## 2021-11-13 LAB — MAGNESIUM: Magnesium: 2 mg/dL (ref 1.7–2.4)

## 2021-11-13 LAB — PREPARE RBC (CROSSMATCH)

## 2021-11-13 LAB — HEPATITIS C ANTIBODY: HCV Ab: REACTIVE — AB

## 2021-11-13 LAB — HEPATITIS B CORE ANTIBODY, TOTAL: Hep B Core Total Ab: REACTIVE — AB

## 2021-11-13 LAB — HEMOGLOBIN AND HEMATOCRIT, BLOOD
HCT: 22.8 % — ABNORMAL LOW (ref 39.0–52.0)
Hemoglobin: 7.6 g/dL — ABNORMAL LOW (ref 13.0–17.0)

## 2021-11-13 LAB — HEPATITIS B SURFACE ANTIGEN: Hepatitis B Surface Ag: NONREACTIVE

## 2021-11-13 LAB — HEPATITIS B SURFACE ANTIBODY,QUALITATIVE: Hep B S Ab: REACTIVE — AB

## 2021-11-13 LAB — APTT: aPTT: 37 seconds — ABNORMAL HIGH (ref 24–36)

## 2021-11-13 MED ORDER — ALTEPLASE 2 MG IJ SOLR: 2.0000 mg | Freq: Once | INTRAMUSCULAR | Status: DC | PRN

## 2021-11-13 MED ORDER — DIPHENHYDRAMINE HCL 50 MG/ML IJ SOLN
25.0000 mg | Freq: Once | INTRAMUSCULAR | Status: AC
Start: 2021-11-13 — End: 2021-11-13
  Administered 2021-11-13: 25 mg via INTRAVENOUS
  Filled 2021-11-13: qty 1

## 2021-11-13 MED ORDER — GABAPENTIN 300 MG PO CAPS
300.0000 mg | ORAL_CAPSULE | Freq: Every day | ORAL | Status: DC
  Administered 2021-11-13 – 2021-11-14 (×2): 300 mg via ORAL
  Filled 2021-11-13 (×2): qty 1

## 2021-11-13 MED ORDER — LIDOCAINE-PRILOCAINE 2.5-2.5 % EX CREA
1.0000 | TOPICAL_CREAM | CUTANEOUS | Status: DC | PRN
  Filled 2021-11-13: qty 5

## 2021-11-13 MED ORDER — AMLODIPINE BESYLATE 5 MG PO TABS
5.0000 mg | ORAL_TABLET | Freq: Every day | ORAL | Status: DC
  Administered 2021-11-13 – 2021-11-15 (×2): 5 mg via ORAL
  Filled 2021-11-13 (×2): qty 1

## 2021-11-13 MED ORDER — ACETAMINOPHEN 325 MG PO TABS
650.0000 mg | ORAL_TABLET | Freq: Four times a day (QID) | ORAL | Status: DC | PRN
  Administered 2021-11-13 (×2): 650 mg via ORAL
  Filled 2021-11-13 (×2): qty 2

## 2021-11-13 MED ORDER — PANTOPRAZOLE SODIUM 40 MG PO TBEC
40.0000 mg | DELAYED_RELEASE_TABLET | Freq: Two times a day (BID) | ORAL | Status: DC
Start: 2021-11-13 — End: 2021-11-15
  Administered 2021-11-13 – 2021-11-15 (×4): 40 mg via ORAL
  Filled 2021-11-13 (×4): qty 1

## 2021-11-13 MED ORDER — SODIUM CHLORIDE 0.9% IV SOLUTION
Freq: Once | INTRAVENOUS | Status: AC
Start: 2021-11-13 — End: 2021-11-13

## 2021-11-13 MED ORDER — POLYETHYLENE GLYCOL 3350 17 G PO PACK: 17.0000 g | PACK | Freq: Every day | ORAL | Status: DC | PRN

## 2021-11-13 MED ORDER — HYDRALAZINE HCL 20 MG/ML IJ SOLN: 10.0000 mg | Freq: Four times a day (QID) | INTRAMUSCULAR | Status: DC | PRN

## 2021-11-13 MED ORDER — CHLORHEXIDINE GLUCONATE CLOTH 2 % EX PADS
6.0000 | MEDICATED_PAD | Freq: Every day | CUTANEOUS | Status: DC
  Administered 2021-11-14 – 2021-11-15 (×2): 6 via TOPICAL

## 2021-11-13 MED ORDER — LIDOCAINE HCL (PF) 1 % IJ SOLN: 5.0000 mL | INTRAMUSCULAR | Status: DC | PRN

## 2021-11-13 MED ORDER — METHOCARBAMOL 500 MG PO TABS
500.0000 mg | ORAL_TABLET | Freq: Four times a day (QID) | ORAL | Status: DC | PRN
Start: 2021-11-13 — End: 2021-11-15
  Administered 2021-11-13 (×2): 500 mg via ORAL
  Filled 2021-11-13 (×3): qty 1

## 2021-11-13 MED ORDER — OXYCODONE HCL 5 MG PO TABS
5.0000 mg | ORAL_TABLET | Freq: Four times a day (QID) | ORAL | Status: DC | PRN
  Administered 2021-11-13 – 2021-11-15 (×6): 5 mg via ORAL
  Filled 2021-11-13 (×6): qty 1

## 2021-11-13 MED ORDER — GABAPENTIN 300 MG PO CAPS: 300.0000 mg | ORAL_CAPSULE | Freq: Three times a day (TID) | ORAL | Status: DC

## 2021-11-13 MED ORDER — ATORVASTATIN CALCIUM 40 MG PO TABS
40.0000 mg | ORAL_TABLET | Freq: Every day | ORAL | Status: DC
  Administered 2021-11-13 – 2021-11-15 (×2): 40 mg via ORAL
  Filled 2021-11-13 (×2): qty 1

## 2021-11-13 MED ORDER — PENTAFLUOROPROP-TETRAFLUOROETH EX AERO
1.0000 | INHALATION_SPRAY | CUTANEOUS | Status: DC | PRN
  Filled 2021-11-13: qty 116

## 2021-11-13 MED ORDER — ACETAMINOPHEN 650 MG RE SUPP: 650.0000 mg | Freq: Four times a day (QID) | RECTAL | Status: DC | PRN

## 2021-11-13 MED ORDER — HEPARIN SODIUM (PORCINE) 1000 UNIT/ML DIALYSIS
1000.0000 [IU] | INTRAMUSCULAR | Status: DC | PRN
  Filled 2021-11-13: qty 1

## 2021-11-13 MED ORDER — TRAZODONE HCL 50 MG PO TABS
50.0000 mg | ORAL_TABLET | Freq: Every evening | ORAL | Status: DC | PRN
  Administered 2021-11-13: 50 mg via ORAL
  Filled 2021-11-13: qty 1

## 2021-11-13 NOTE — H&P (View-Only) (Signed)
[facility] Consult: 8:43 AM 11/13/2021  LOS: 0 days    Referring Provider: Dr Verlon Au  Primary Care Physician:  [name], NP Primary Gastroenterologist:  unassigned, no outpt GI encounters.       Reason for Consultation: Dark stools, progressive anemia.  Encounters with Daniel [name] but more recently Daniel [name]   HPI: Daniel [name] is a 61 y.o. male.  PMH listed below.  Previous Plavix following right SFA stenting March 2023 since discontinued due to recurrent GIB.  Intra cerebral hemorrhage.  ESRD, hemodialysis MWF.  Chronic anemia (IDA, chronic dz and GIB sources).  Recurrent GI bleeding with findings of duodenal ulcer, small bowel AVMs, erosive esophagitis, hyperplastic duodenal polyps on previous endo studies.  HCV, quantitative 9,880,000 in 07/2021 (no treatment thus far).  Cirrhosis.  HCCA.     06/2019 colonoscopy: sigmoid diverticulosis, possible old blood in terminal ileum suggesting SB source.  Barrett's esophagus on 01/2021 EGD.  Latest GI studies include but there are several others dating to March 2021. 07/05/2021 SBE.  Friable gastric mucosa, normal esophagus.  A few, recently bleeding AVMs in duodenum treated with monopolar probe.  Single nonbleeding jejunal AVM treated with probe 07/05/2021 flex sig with blood in rectum, rectosigmoid and sigmoid. 08/06/21 SBE: Severe, circumferential esophagitis without bleeding.  Pill esophagitis suspected.  Friable gastric mucosa/gastritis.  Multiple friable duodenal polyps bled on contact and possible source of GI bleed.  Examined jejunum normal.  Pathology showed Candida esophagitis with erosion.  Gastric polyps were hyperplastic with focal erosion at surface, no H. pylori.  Duodenal polyps also hyperplastic, no features of celiac disease on duodenal  biopsies. 08/24/2021 SBE.  Distal extent tattooed.  Grade 1 esophageal varices.  Portal hypertensive gastropathy.  Duodenal polyps. 08/25/2021 VCE.  Duodenal polyps.  Small red spot vs diminutive AVM without blood anywhere.  Green liquid secretions and stool throughout the bowel.  Study considered nondiagnostic 09/15/2021 SBE.  3 duodenal AVMs treated with APC and felt to be likely source of bleeding.  Small esophageal varices, portal hypertensive gastropathy and solitary gastric polyp. 10/06/2021 VCE, capsule placed using endoscope.  Capsule moved back into the stomach then into the duodenum at about 6 hours and 20-minute 9 minutes then back into stomach where battery life of 12 hours expired.  Visualized previously cauterized sites of pre-pyloric/duodenal polyps, duodenal AVM w heme in region.  Dr. Carlean Purl chose not to pursue additional endoscopy.  Felt pt would need deep enteroscopy which may require tertiary care facility.  He said "afraid he will have recurrent problems regardless" 08/05/2021 CT angio abdomen pelvis.  No active bleeding.  Outpouching near expected position of duodenal bulb or post bulbar duodenum, suspicious for focal duodenal ulcer.  Adjacent retroperitoneal fat edema.  4.4 cm mass near juncture of liver segments IVb and VIII as well as a possible adjacent 2.2 cm mass in segment VIII.  Differential includes hepatocellular carcinoma, neuroendocrine carcinoma.  Not excluded for cholangiocarcinoma and metastatic disease. 08/07/21 MRI abd, MRCP: Cirrhosis.  5 cm mass at segment 4A is LI-RADS 5, c/w hepatocellular carcinoma.  2  cm mass in the central right hepatic lobe LI-RADS 3. Consider follow-up in 3-6 months 10/08/2021: Extensive multifocal pulmonary consolidation, infiltrate progressive compared with previous, bilateral pleural effusions.  Stable mild peripancreatic edema in pancreaticoduodenal groove, Querry mild edema/interstitial pancreatitis.  Central hepatic masses not well delineated on  noncontrast study.  Cirrhosis.  Cholelithiasis.  Nonobstructing left nephrolithiasis AFP 13.9 in mid April 2023, 10.5 in early May 2023 Underwent Y90 treatment for [facility] on 7/13, next session due 7/25  Last hospital GI encounter was 5 weeks ago.  At that point the repeat, essentially failed, VCE was performed.  Received 2 PRBCs with Hb 6.9 .. 8.6.  8.9 on 6/29, 7.8 last week, 7.2 yesterday.  6.7 this AM.  Splenomegaly.  Trace ascites.  Small bilateral pleural effusions.  SQ edema of abdominal wall Last dialysis session was on Friday, 6 days ago.  He did not feel well, nothing specific but sounds like malaise, so did not go to dialysis on Monday.  For couple of days he has been passing black stools last episode was yesterday evening.  No nausea or vomiting.  No significant abdominal pain.  This presentation similar to multiple prior presentations for GI bleed.  Arrived in the ED yesterday morning.  Hgb 6.7.  Platelets 108.  Normal MCV.  INR normal 1.1 T. bili 0.7.  Alk phos 122.  AST/ALT of 44/16: all in line w prior readings.      Past Medical History:  Diagnosis Date   Anemia    Diabetes mellitus without complication [name])    patient denies   Dialysis patient [facility])    End stage chronic kidney disease ([place])    Hypertension    ICH (intracerebral hemorrhage) ([place]) 05/20/2017   PAD (peripheral artery disease) ([facility])    Shoulder pain, left 06/28/2013    Past Surgical History:  Procedure Laterality Date   A/V FISTULAGRAM N/A 08/15/2020   Procedure: A/V FISTULAGRAM - Left Upper;  Surgeon: Cherre Robins, MD;  Location: [place] CV LAB;  Service: Cardiovascular;  Laterality: N/A;   ABDOMINAL AORTOGRAM W/LOWER EXTREMITY N/A 04/08/2021   Procedure: ABDOMINAL AORTOGRAM W/LOWER EXTREMITY;  Surgeon: Waynetta Sandy, MD;  Location: [place] CV LAB;  Service: Cardiovascular;  Laterality: N/A;   AMPUTATION Left 04/16/2021   Procedure: LEFT BELOW KNEE AMPUTATION;  Surgeon: Angelia Mould, MD;  Location: [place];  Service: Vascular;  Laterality: Left;   AMPUTATION Left 06/17/2021   Procedure: REVISION AMPUTATION BELOW KNEE;  Surgeon: Broadus John, MD;  Location: [place];  Service: Vascular;  Laterality: Left;   APPENDECTOMY     APPLICATION OF WOUND VAC Left 06/17/2021   Procedure: APPLICATION OF WOUND VAC;  Surgeon: Broadus John, MD;  Location: [place];  Service: Vascular;  Laterality: Left;   AV FISTULA PLACEMENT Left 08/03/2019   Procedure: LEFT ARM ARTERIOVENOUS (AV) CEPHALIC  FISTULA CREATION;  Surgeon: Waynetta Sandy, MD;  Location: [place];  Service: Vascular;  Laterality: Left;   BIOPSY  06/30/2019   Procedure: BIOPSY;  Surgeon: Ronald Lobo, MD;  Location: [facility] ENDOSCOPY;  Service: Endoscopy;;   BIOPSY  08/02/2019   Procedure: BIOPSY;  Surgeon: Yetta Flock, MD;  Location: [facility] ENDOSCOPY;  Service: Gastroenterology;;   BIOPSY  02/08/2021   Procedure: BIOPSY;  Surgeon: Sharyn Creamer, MD;  Location: [facility] ENDOSCOPY;  Service: Gastroenterology;;   BIOPSY  02/24/2021   Procedure: BIOPSY;  Surgeon: [name], MD;  Location: [place];  Service: Gastroenterology;;  BIOPSY  03/26/2021   Procedure: BIOPSY;  Surgeon: Lavena Bullion, DO;  Location: [place];  Service: Gastroenterology;;   BIOPSY  08/06/2021   Procedure: BIOPSY;  Surgeon: [name], MD;  Location: [place];  Service: Gastroenterology;;   BIOPSY  09/15/2021   Procedure: BIOPSY;  Surgeon: Sharyn Creamer, MD;  Location: [place];  Service: Gastroenterology;;   COLONOSCOPY  01/23/2012   Procedure: COLONOSCOPY;  Surgeon: Danie Binder, MD;  [facility];  Service: Endoscopy;  Laterality: N/A;  11:10 AM   COLONOSCOPY WITH PROPOFOL N/A 06/30/2019   Procedure: COLONOSCOPY WITH PROPOFOL;  Surgeon: Ronald Lobo, MD;  Location: [place];  Service: Endoscopy;  Laterality: N/A;   ENTEROSCOPY N/A 08/02/2019   Procedure: ENTEROSCOPY;  Surgeon:  Yetta Flock, MD;  Location: [facility] ENDOSCOPY;  Service: Gastroenterology;  Laterality: N/A;   ENTEROSCOPY N/A 02/24/2021   Procedure: ENTEROSCOPY;  Surgeon: [name], MD;  Location: [facility] ENDOSCOPY;  Service: Gastroenterology;  Laterality: N/A;   ENTEROSCOPY N/A 03/26/2021   Procedure: ENTEROSCOPY;  Surgeon: Lavena Bullion, DO;  Location: [place];  Service: Gastroenterology;  Laterality: N/A;   ENTEROSCOPY N/A 07/05/2021   Procedure: ENTEROSCOPY;  Surgeon: Carol Ada, MD;  Location: [place];  Service: Gastroenterology;  Laterality: N/A;   ENTEROSCOPY N/A 08/06/2021   Procedure: ENTEROSCOPY;  Surgeon: [name], MD;  Location: [facility] ENDOSCOPY;  Service: Gastroenterology;  Laterality: N/A;   ENTEROSCOPY N/A 08/24/2021   Procedure: ENTEROSCOPY;  Surgeon: Ladene Artist, MD;  Location: [facility] ENDOSCOPY;  Service: Gastroenterology;  Laterality: N/A;   ENTEROSCOPY N/A 09/15/2021   Procedure: ENTEROSCOPY;  Surgeon: Sharyn Creamer, MD;  Location: [facility] ENDOSCOPY;  Service: Gastroenterology;  Laterality: N/A;   ENTEROSCOPY N/A 10/06/2021   Procedure: ENTEROSCOPY;  Surgeon: [name], MD;  Location: [facility] ENDOSCOPY;  Service: Gastroenterology;  Laterality: N/A;   ESOPHAGOGASTRODUODENOSCOPY N/A 08/10/2020   Procedure: ESOPHAGOGASTRODUODENOSCOPY (EGD);  Surgeon: Jerene Bears, MD;  Location: [facility] ENDOSCOPY;  Service: Gastroenterology;  Laterality: N/A;   ESOPHAGOGASTRODUODENOSCOPY (EGD) WITH PROPOFOL N/A 06/30/2019   Procedure: ESOPHAGOGASTRODUODENOSCOPY (EGD) WITH PROPOFOL;  Surgeon: Ronald Lobo, MD;  Location: [place];  Service: Endoscopy;  Laterality: N/A;   ESOPHAGOGASTRODUODENOSCOPY (EGD) WITH PROPOFOL N/A 01/12/2021   Procedure: ESOPHAGOGASTRODUODENOSCOPY (EGD) WITH PROPOFOL;  Surgeon: Sharyn Creamer, MD;  Location: [place];  Service: Gastroenterology;  Laterality: N/A;   ESOPHAGOGASTRODUODENOSCOPY (EGD) WITH PROPOFOL N/A 02/08/2021   Procedure:  ESOPHAGOGASTRODUODENOSCOPY (EGD) WITH PROPOFOL;  Surgeon: Sharyn Creamer, MD;  Location: [place];  Service: Gastroenterology;  Laterality: N/A;   FISTULA SUPERFICIALIZATION Left 10/17/2019   Procedure: LEFT UPPER EXTREMITY FISTULA REVISION, SIDE BRANCH LIGATION,  AND SUPERFICIALIZATION;  Surgeon: Marty Heck, MD;  Location: [place];  Service: Vascular;  Laterality: Left;   FISTULA SUPERFICIALIZATION Left [id number]/41/[id number]   Procedure: PLICATION OF ANEURYSM LEFT ARTERIOVENOUS FISTULA;  Surgeon: Angelia Mould, MD;  Location: [place];  Service: Vascular;  Laterality: Left;   FLEXIBLE SIGMOIDOSCOPY N/A 07/05/2021   Procedure: FLEXIBLE SIGMOIDOSCOPY;  Surgeon: Carol Ada, MD;  Location: [place];  Service: Gastroenterology;  Laterality: N/A;   GIVENS CAPSULE STUDY N/A 06/30/2019   Procedure: GIVENS CAPSULE STUDY;  Surgeon: Ronald Lobo, MD;  Location: [place];  Service: Endoscopy;  Laterality: N/A;   GIVENS CAPSULE STUDY N/A 08/25/2021   Procedure: GIVENS CAPSULE STUDY;  Surgeon: Ladene Artist, MD;  Location: [place];  Service: Gastroenterology;  Laterality: N/A;   GIVENS CAPSULE STUDY N/A 10/06/2021   Procedure:  GIVENS CAPSULE STUDY;  Surgeon: [name], MD;  Location: [place];  Service: Gastroenterology;  Laterality: N/A;   HEMOSTASIS CLIP PLACEMENT  08/10/2020   Procedure: HEMOSTASIS CLIP PLACEMENT;  Surgeon: Jerene Bears, MD;  Location: [place];  Service: Gastroenterology;;   HEMOSTASIS CLIP PLACEMENT  01/12/2021   Procedure: HEMOSTASIS CLIP PLACEMENT;  Surgeon: Sharyn Creamer, MD;  Location: [place];  Service: Gastroenterology;;   HEMOSTASIS CONTROL  08/02/2019   Procedure: HEMOSTASIS CONTROL;  Surgeon: Yetta Flock, MD;  Location: [place];  Service: Gastroenterology;;   HOT HEMOSTASIS  02/24/2021   Procedure: HOT HEMOSTASIS (ARGON PLASMA COAGULATION/BICAP);  Surgeon: [name], MD;  Location: [facility] ENDOSCOPY;  Service:  Gastroenterology;;   HOT HEMOSTASIS N/A 03/26/2021   Procedure: HOT HEMOSTASIS (ARGON PLASMA COAGULATION/BICAP);  Surgeon: Lavena Bullion, DO;  Location: [facility] ENDOSCOPY;  Service: Gastroenterology;  Laterality: N/A;   HOT HEMOSTASIS N/A 07/05/2021   Procedure: HOT HEMOSTASIS (ARGON PLASMA COAGULATION/BICAP);  Surgeon: Carol Ada, MD;  Location: [place];  Service: Gastroenterology;  Laterality: N/A;   HOT HEMOSTASIS N/A 09/15/2021   Procedure: HOT HEMOSTASIS (ARGON PLASMA COAGULATION/BICAP);  Surgeon: Sharyn Creamer, MD;  Location: [place];  Service: Gastroenterology;  Laterality: N/A;   HOT HEMOSTASIS N/A 10/06/2021   Procedure: HOT HEMOSTASIS (ARGON PLASMA COAGULATION/BICAP);  Surgeon: [name], MD;  Location: [place];  Service: Gastroenterology;  Laterality: N/A;   INCISION AND DRAINAGE ABSCESS N/A 06/29/2016   Procedure: INCISION AND DRAINAGE ABDOMINAL WALL ABSCESS;  Surgeon: Alphonsa Overall, MD;  Location: [facility];  Service: General;  Laterality: N/A;   INSERTION OF DIALYSIS CATHETER Right 08/03/2019   Procedure: INSERTION OF DIALYSIS CATHETER;  Surgeon: Waynetta Sandy, MD;  Location: [place];  Service: Vascular;  Laterality: Right;   INSERTION OF DIALYSIS CATHETER Right 10/22/2019   Procedure: INSERTION OF 23CM TUNNELED DIALYSIS CATHETER RIGHT INTERNAL JUGULAR;  Surgeon: Angelia Mould, MD;  Location: [place];  Service: Vascular;  Laterality: Right;   INSERTION OF DIALYSIS CATHETER Right 08/12/2020   Procedure: INSERTION OF Right internal Jugular TUNNELED  DIALYSIS CATHETER.;  Surgeon: Waynetta Sandy, MD;  Location: [place];  Service: Vascular;  Laterality: Right;   INSERTION OF DIALYSIS CATHETER N/A 04/19/2021   Procedure: INSERTION OF TUNNELED DIALYSIS CATHETER;  Surgeon: Angelia Mould, MD;  Location: [place];  Service: Vascular;  Laterality: N/A;   IR 3D INDEPENDENT WKST  11/07/2021   IR ANGIOGRAM SELECTIVE EACH ADDITIONAL VESSEL  11/07/2021    IR ANGIOGRAM SELECTIVE EACH ADDITIONAL VESSEL  11/07/2021   IR ANGIOGRAM SELECTIVE EACH ADDITIONAL VESSEL  11/07/2021   IR ANGIOGRAM VISCERAL SELECTIVE  11/07/2021   IR EMBO ARTERIAL NOT HEMORR HEMANG INC GUIDE ROADMAPPING  11/07/2021   IR RADIOLOGIST EVAL & MGMT  09/12/2021   IR US GUIDE VASC ACCESS LEFT  11/07/2021   Left heel surgery     LOWER EXTREMITY ANGIOGRAPHY N/A 07/08/2021   Procedure: Lower Extremity Angiography;  Surgeon: Cherre Robins, MD;  Location: [place] CV LAB;  Service: Cardiovascular;  Laterality: N/A;   PENILE BIOPSY N/A 03/26/2020   Procedure: PENILE ULCER DEBRIDEMENT;  Surgeon: Remi Haggard, MD;  Location: [facility];  Service: Urology;  Laterality: N/A;  30 MINS   PERIPHERAL VASCULAR INTERVENTION Right 07/08/2021   Procedure: PERIPHERAL VASCULAR INTERVENTION;  Surgeon: Cherre Robins, MD;  Location: [place] [facility];  Service: Cardiovascular;  Laterality: Right;   SCLEROTHERAPY  01/12/2021   Procedure: Clide Deutscher;  Surgeon: Lorenso Courier,  Grace Blight, MD;  Location: [facility] ENDOSCOPY;  Service: Gastroenterology;;   Clide Deutscher  02/24/2021   Procedure: Clide Deutscher;  Surgeon: [name], MD;  Location: [place];  Service: Gastroenterology;;   SUBMUCOSAL TATTOO INJECTION  08/24/2021   Procedure: SUBMUCOSAL TATTOO INJECTION;  Surgeon: Ladene Artist, MD;  Location: [place];  Service: Gastroenterology;;   SUBMUCOSAL TATTOO INJECTION  09/15/2021   Procedure: SUBMUCOSAL TATTOO INJECTION;  Surgeon: Sharyn Creamer, MD;  Location: [facility] ENDOSCOPY;  Service: Gastroenterology;;    Prior to Admission medications   Medication Sig Start Date End Date Taking? Authorizing Provider  amLODipine (NORVASC) 5 MG tablet Take 1 tablet (5 mg total) by mouth daily. 09/18/21 12/17/21  [place] Donnamarie Poag, DO  atorvastatin (LIPITOR) 40 MG tablet Take 1 tablet (40 mg total) by mouth daily. 09/18/21 12/17/21  [place] Donnamarie Poag, DO  gabapentin (NEURONTIN) 300 MG capsule Take 1 capsule (300 mg  total) by mouth 3 (three) times daily. 11/07/21   [name], NP  loperamide (IMODIUM A-D) 2 MG tablet Take 1 tablet (2 mg total) by mouth 4 (four) times daily as needed for diarrhea or loose stools. 10/22/21   [name], NP  pantoprazole (PROTONIX) 40 MG tablet Take 1 tablet (40 mg total) by mouth 2 (two) times daily before a meal. 10/10/21 01/08/22  Mercy Riding, MD  traZODone (DESYREL) 50 MG tablet Take 1 tablet (50 mg total) by mouth at bedtime as needed for sleep. 10/22/21   [name], NP  colchicine 0.6 MG tablet Take 0.5 tablets (0.3 mg total) by mouth 2 (two) times daily. 07/24/20 07/29/20  Noemi Chapel, MD  furosemide (LASIX) 40 MG tablet Take 1 tablet (40 mg total) by mouth daily. 07/25/19 02/09/20  Ladona Horns, MD    Scheduled Meds:  amLODipine  5 mg Oral Daily   atorvastatin  40 mg Oral Daily   gabapentin  300 mg Oral QHS   pantoprazole (PROTONIX) IV  40 mg Intravenous Q12H   Infusions:  PRN Meds: acetaminophen **OR** acetaminophen, hydrALAZINE, methocarbamol, oxyCODONE, polyethylene glycol, traZODone   Allergies as of 11/12/2021 - Review Complete 11/12/2021  Allergen Reaction Noted   Seroquel [quetiapine] Other (See Comments) 04/22/2021   Dilaudid [hydromorphone hcl] Itching and Other (See Comments) 07/29/2020    Family History  Problem Relation Age of Onset   Colon cancer Neg Hx     Social History   Socioeconomic History   Marital status: Single    Spouse name: Not on file   Number of children: 2   Years of education: 12   Highest education level: 12th grade  Occupational History   Occupation: disabled  Tobacco Use   Smoking status: Every Day    Packs/day: 0.50    Years: 45.00    Total pack years: 22.50    Types: Cigarettes   Smokeless tobacco: Never   Tobacco comments:    Pt left before information given  Vaping Use   Vaping Use: Never used  Substance and Sexual Activity   Alcohol use: Not Currently   Drug use: Not Currently     Types: "Crack" cocaine    Comment: last in 2020   Sexual activity: Yes    Birth control/protection: None  Other Topics Concern   Not on file  Social History Narrative   Goes to dialysis M-W-F, [facility] using a wheelchair at present, waiting for prosthetic. Looking for handicapped housing for immediate move in.   Social Determinants of Health   Financial  Resource Strain: High Risk (11/01/2021)   Overall Financial Resource Strain (CARDIA)    Difficulty of Paying Living Expenses: Hard  Food Insecurity: No Food Insecurity (11/01/2021)   Hunger Vital Sign    Worried About Running Out of Food in the Last Year: Never true    Ran Out of Food in the Last Year: Never true  Transportation Needs: No Transportation Needs (11/01/2021)   PRAPARE - Hydrologist (Medical): No    Lack of Transportation (Non-Medical): No  Physical Activity: Insufficiently Active (11/01/2021)   Exercise Vital Sign    Days of Exercise per Week: 1 day    Minutes of Exercise per Session: 20 min  Stress: No Stress Concern Present (11/01/2021)   [place]    Feeling of Stress : Not at all  Social Connections: Socially Isolated (11/01/2021)   Social Connection and Isolation Panel [NHANES]    Frequency of Communication with Friends and Family: More than three times a week    Frequency of Social Gatherings with Friends and Family: More than three times a week    Attends Religious Services: Never    Marine scientist or Organizations: No    Attends Archivist Meetings: Never    Marital Status: Divorced  Human resources officer Violence: Not At Risk (11/01/2021)   Humiliation, Afraid, Rape, and Kick questionnaire    Fear of Current or Ex-Partner: No    Emotionally Abused: No    Physically Abused: No    Sexually Abused: No    REVIEW OF SYSTEMS: Constitutional: Malaise, weakness for several days. ENT:  No nose bleeds Pulm: Denies  shortness of breath, active cough. CV:  No palpitations, no LE edema.  No angina GU:  No hematuria, no frequency GI: See HPI. Heme: Other than the black stools, has not had any other excessive bleeding or bruising. Transfusions: On multiple occasions. Neuro:  No headaches, no peripheral tingling or numbness.  No syncope, no seizures Derm:  No itching, no rash or sores.  Endocrine:  No sweats or chills.  No polyuria or dysuria Immunization: Reviewed. Travel: Not queried.   PHYSICAL EXAM: Vital signs in last 24 hours: Vitals:   11/13/21 0535 11/13/21 0757  BP: (!) 180/88 119/84  Pulse: 82 77  Resp: 19 16  Temp: 97.9 F (36.6 C) 98 F (36.7 C)  SpO2: 100% 99%   Wt Readings from Last 3 Encounters:  11/12/21 108.9 kg  11/07/21 99.8 kg  10/24/21 97.5 kg    General: Patient looks somewhat chronically ill.  He is resting comfortably on stretcher in ED room. Head: No facial asymmetry or swelling.  No signs of head trauma. Eyes: No scleral icterus.  No conjunctival pallor. Ears: No hearing deficit Nose: No congestion or discharge Mouth: Some dental caries but most teeth are intact.  Tongue is midline.  Mucosa is moist, pink, clear. Neck: No JVD, no masses, no thyromegaly Lungs: Clear bilaterally without labored breathing or cough Heart: RRR.  No MRG.  S1, S2 present Abdomen: Soft.  Slight tenderness without guarding or rebound in the right upper quadrant.  Do not appreciate hepatomegaly or liver masses.  Nondistended.  Active bowel sounds..   Rectal: Deferred.  Per ED physician, stool was brown on DRE, tested FOBT positive Musc/Skeltl: Left BKA Extremities: No right lower extremity edema.  Dialysis graft/access left upper extremity Neurologic: Oriented x3.  Good historian, excellent recall of dates.  Moves all 4  limbs without gross deficits, strength not tested. Skin: No rash, no sores, no telangiectasia. Nodes: No cervical adenopathy Psych: Cooperative, calm,  pleasant.  Intake/Output from previous day: 07/18 0701 - 07/19 0700 In: 86 [IV Piggyback:49] Out: -  Intake/Output this shift: Total I/O In: 315 [Blood:315] Out: -   LAB RESULTS: Recent Labs    11/12/21 0850 11/13/21 0351  WBC 4.5 4.0  HGB 7.2* 6.7*  HCT 21.8* 19.4*  PLT 109* 108*   BMET Lab Results  Component Value Date   NA 136 11/13/2021   NA 136 11/12/2021   NA 141 11/07/2021   K 4.1 11/13/2021   K 4.2 11/12/2021   K 3.8 11/07/2021   CL 96 (L) 11/13/2021   CL 93 (L) 11/12/2021   CL 98 11/07/2021   CO2 19 (L) 11/13/2021   CO2 17 (L) 11/12/2021   CO2 25 11/07/2021   GLUCOSE 95 11/13/2021   GLUCOSE 87 11/12/2021   GLUCOSE 89 11/07/2021   BUN 67 (H) 11/13/2021   BUN 59 (H) 11/12/2021   BUN 41 (H) 11/07/2021   CREATININE 12.18 (H) 11/13/2021   CREATININE 10.25 (H) 11/12/2021   CREATININE 6.94 (H) 11/07/2021   CALCIUM 8.3 (L) 11/13/2021   CALCIUM 8.5 (L) 11/12/2021   CALCIUM 8.5 (L) 11/07/2021   LFT Recent Labs    11/12/21 0850 11/13/21 0351  PROT 8.2* 8.1  ALBUMIN 2.5* 2.4*  AST 47* 44*  ALT 17 16  ALKPHOS 141* 122  BILITOT 1.2 0.7   PT/INR Lab Results  Component Value Date   INR 1.1 11/13/2021   INR 1.1 11/07/2021   INR 1.0 10/24/2021   Hepatitis Panel No results for input(s): "HEPBSAG", "HCVAB", "HEPAIGM", "HEPBIGM" in the last 72 hours. C-Diff No components found for: "CDIFF" Lipase     Component Value Date/Time   LIPASE 49 10/09/2021 0201    Drugs of Abuse     Component Value Date/Time   LABOPIA NONE DETECTED 03/29/2019 1209   COCAINSCRNUR POSITIVE (A) 03/29/2019 1209   LABBENZ NONE DETECTED 03/29/2019 1209   AMPHETMU NONE DETECTED 03/29/2019 1209   THCU NONE DETECTED 03/29/2019 1209   LABBARB NONE DETECTED 03/29/2019 1209     RADIOLOGY STUDIES: No results found.    IMPRESSION:     Recurrent GIB.  Known and previously treated duodenal AVMs, hyperplastic duodenal polyps.  Suspected to have AVMs deep in small bowel  amenable only to spiral endoscopy.    Recurrent ABL on top of anemia of chronic kidney dz.  Completed 1/1 PRBCs.    HCCA.  Multifocal.  Followed by [name].  Y90 treatment 7/13, next treatment 7/25.  Cirrhosis of the liver.  HCV with significant viral load in April.  On chart review this does not appear to have ever been treated.     ESRD.  On MWF hemodialysis.  Missed dialysis on Monday, due to dialysis today    PLAN:      Per Dr Loletha Carrow.  ? Utility of repeating SBE.  If pursued it would be tomorrow at soonest so ordering patient renal diet.  Switch to his normal Protonix 40 mg po bid.      Azucena Freed  11/13/2021, 8:43 AM Phone [phone]  I have taken an interval history, thoroughly reviewed the chart and examined the patient. I agree with the Advanced Practitioner's note, impression and recommendations, and have recorded additional findings, impressions and recommendations below. I performed a substantive portion of this encounter (>50% time spent), including a  complete performance of the medical decision making.  My additional thoughts are as follows:  Extensive chart review performed on this medically complex patient with many previous inpatient GI consultations and endoscopic evaluations.  Many endoscopic reports and other data points as well as consultation and progress notes were reviewed in preparation for this consult.  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  He has a planned IR procedure to treat the 2 liver lesions within the next week.  Recent hospital admission with another small bowel enteroscopy performed that treated at least one bleeding proximal small bowel AVM.  Video capsule study under endoscopic delivery was not successful, as the capsule traveled from the duodenal bulb back to the stomach and was retained there for  the duration of battery life. The clinical suspicion is that this patient most likely has more distal small bowel AVMs beyond the reach of push enteroscopy.  He has recurrent bleeding that is almost certainly small bowel AVM source, and of course unknown if it is from similar area to what was recently found or something more distal. This is on top of his multiple chronic medical conditions.  Our options are quite limited here.  I recommend a small bowel enteroscopy tomorrow after he receives PRBC transfusion and hemodialysis today.  He was agreeable after discussion of procedure and risks.  The benefits and risks of the planned procedure were described in detail with the patient or (when appropriate) their health care proxy.  Risks were outlined as including, but not limited to, bleeding, infection, perforation, adverse medication reaction leading to cardiac or pulmonary decompensation, pancreatitis (if ERCP).  The limitation of incomplete mucosal visualization was also discussed.  No guarantees or warranties were given.  If that enteroscopy is unable to localize any actively bleeding sources, then I would forego another attempt at video capsule endoscopy.  Since he is under the care of of hematology and has a nonoperative hepatocellular carcinoma soon to be treated, perhaps consideration could be given to the use of long-acting (LAR) octreotide in the outpatient setting to hopefully decrease small bowel AVM bleeding and transfusion requirements.   Nelida Meuse III [phone]

## 2021-11-13 NOTE — Assessment & Plan Note (Signed)
   Patient presenting with several day history of posterior neck pain  Kernig's and Brudzinski's sign's are negative.  Patient exhibits no photophobia or fever.  Meningitis unlikely.  No recent trauma.  Exam most consistent with musculoskeletal cause of pain, possibly secondary to muscle spasm  Provided patient with as needed Robaxin and low doses of oxycodone for now.

## 2021-11-13 NOTE — Progress Notes (Signed)
Complaints of lower back pain during treatment. 5/10 pain. Tylenol given.

## 2021-11-13 NOTE — Assessment & Plan Note (Addendum)
   Patient experiencing progressively worsening anemia with 2-day history of melena  Known history of hepatitis C cirrhosis with numerous hospitalizations for bleeding complications with portal hypertensive gastropathy, duodenal ulceration, stomach ulceration and multiple AVMs in the past requiring EGD intervention with APC  Last presentation with bleeding requiring EGD with intervention was in mid June.  ER provider has already initiated Protonix 40 mg IV every 12 hours  We will keep patient n.p.o. after midnight  ER provider has contacted Dr. Bryan Lemma with [facility] gastroenterology who stated that his group will evaluate patient in the morning in consultation to determine if repeat endoscopic intervention is necessary  Performing CBCs every 6 hours  We will transfuse if hemoglobin drops below 7  Avoiding anticoagulants

## 2021-11-13 NOTE — H&P (Addendum)
History and Physical    Patient: Daniel [name] MRN: [id number] DOA: 11/12/2021  Date of Service: the patient was seen and examined on 11/13/2021  Patient coming from: Home  Chief Complaint:  Chief Complaint  Patient presents with   Neck Pain   Melena    HPI:   61 year old male with past medical history of hepatitis C cirrhosis, hepatocellular carcinoma (Dx'ed 07/2021 follows with [name], S/P IR radioembolization 11/07/2021),   recurrent GI bleeds (due to portal hypertensive gastropathy, duodenal AVM, gastric and duodenal ulcers in the past), end-stage renal disease (HD MWF), hypertension, intracranial hemorrhage 2019, peripheral vascular disease, S/P left BKA who presents to [facility] emergency department with complaints of neck pain and melena.  Of note, most recent hospitalization was to [facility] from 6/10 until 6/15.  Patient presented with melena and was felt to be suffering from acute blood loss anemia with substantial drop in hemoglobin.  Patient was given a 1 unit packed red blood cell transfusion.  GI was consulted and patient underwent small bowel endoscopy revealing friable gastric polyps at the pylorus treated with APC as well as evidence of blood in the duodenal bulb to bleeding or recently bleeding AVMs in the duodenum treated with APC.  Patient was also treated for pneumonia with ceftriaxone and doxycycline during this hospital stay.  Patient was discharged home on Protonix twice daily and told to avoid all anticoagulants including aspirin.  Patient explains that on 7/17 a began experiencing episodes of black tarry stool.  Patient denies any associated abdominal pain.  Patient denies any nausea, vomiting or fever.  Patient denies any weakness shortness of breath or chest pain.  In 36 hours that followed patient experienced an additional 4 episodes of melena.  Patient also noticed that around the same time he was experiencing neck pain.  Patient describes  his neck pain is sharp in quality, located in the posterior neck, nonradiating and severe in intensity.  Pain is worse with movement or turning of the head.  Patient denies photophobia, nausea or neck stiffness.  Due to the symptoms, patient presented to [facility] emergency department for evaluation on 7/18.  Upon evaluation in the emergency department rectal exam revealed Hemoccult positivity.  CBC revealed a hemoglobin of 7.2, down from 8.92 weeks prior.  Due to concerns for recurrent gastrointestinal bleeding emergency room provider has contacted hospitalist group to assess the patient for admission the hospital.  ER provider did send a secure chat message to [facility] gastroenterology requesting formal consultation.  ER provider also initiated intravenous Protonix.  Review of Systems: Review of Systems  Gastrointestinal:  Positive for melena.  Musculoskeletal:  Positive for neck pain.  All other systems reviewed and are negative.    Past Medical History:  Diagnosis Date   Anemia    Diabetes mellitus without complication [facility])    patient denies   Dialysis patient [facility])    End stage chronic kidney disease ([place])    Hypertension    ICH (intracerebral hemorrhage) ([place]) 05/20/2017   PAD (peripheral artery disease) ([facility])    Shoulder pain, left 06/28/2013    Past Surgical History:  Procedure Laterality Date   A/V FISTULAGRAM N/A 08/15/2020   Procedure: A/V FISTULAGRAM - Left Upper;  Surgeon: Cherre Robins, MD;  Location: [place] [facility];  Service: Cardiovascular;  Laterality: N/A;   ABDOMINAL AORTOGRAM W/LOWER EXTREMITY N/A 04/08/2021   Procedure: ABDOMINAL AORTOGRAM W/LOWER EXTREMITY;  Surgeon: Waynetta Sandy, MD;  Location: [facility]  INVASIVE CV LAB;  Service: Cardiovascular;  Laterality: N/A;   AMPUTATION Left 04/16/2021   Procedure: LEFT BELOW KNEE AMPUTATION;  Surgeon: Angelia Mould, MD;  Location: [place];  Service: Vascular;  Laterality: Left;   AMPUTATION  Left 06/17/2021   Procedure: REVISION AMPUTATION BELOW KNEE;  Surgeon: Broadus John, MD;  Location: [place];  Service: Vascular;  Laterality: Left;   APPENDECTOMY     APPLICATION OF WOUND VAC Left 06/17/2021   Procedure: APPLICATION OF WOUND VAC;  Surgeon: Broadus John, MD;  Location: [place];  Service: Vascular;  Laterality: Left;   AV FISTULA PLACEMENT Left 08/03/2019   Procedure: LEFT ARM ARTERIOVENOUS (AV) CEPHALIC  FISTULA CREATION;  Surgeon: Waynetta Sandy, MD;  Location: [place];  Service: Vascular;  Laterality: Left;   BIOPSY  06/30/2019   Procedure: BIOPSY;  Surgeon: Ronald Lobo, MD;  Location: [facility] ENDOSCOPY;  Service: Endoscopy;;   BIOPSY  08/02/2019   Procedure: BIOPSY;  Surgeon: Yetta Flock, MD;  Location: [facility] ENDOSCOPY;  Service: Gastroenterology;;   BIOPSY  02/08/2021   Procedure: BIOPSY;  Surgeon: Sharyn Creamer, MD;  Location: [facility] ENDOSCOPY;  Service: Gastroenterology;;   BIOPSY  02/24/2021   Procedure: BIOPSY;  Surgeon: [name], MD;  Location: [facility] ENDOSCOPY;  Service: Gastroenterology;;   BIOPSY  03/26/2021   Procedure: BIOPSY;  Surgeon: Lavena Bullion, DO;  Location: [place];  Service: Gastroenterology;;   BIOPSY  08/06/2021   Procedure: BIOPSY;  Surgeon: [name], MD;  Location: [place];  Service: Gastroenterology;;   BIOPSY  09/15/2021   Procedure: BIOPSY;  Surgeon: Sharyn Creamer, MD;  Location: [place];  Service: Gastroenterology;;   COLONOSCOPY  01/23/2012   Procedure: COLONOSCOPY;  Surgeon: Danie Binder, MD;  [facility];  Service: Endoscopy;  Laterality: N/A;  11:10 AM   COLONOSCOPY WITH PROPOFOL N/A 06/30/2019   Procedure: COLONOSCOPY WITH PROPOFOL;  Surgeon: Ronald Lobo, MD;  Location: [place];  Service: Endoscopy;  Laterality: N/A;   ENTEROSCOPY N/A 08/02/2019   Procedure: ENTEROSCOPY;  Surgeon: Yetta Flock, MD;  Location: [facility] ENDOSCOPY;  Service: Gastroenterology;  Laterality: N/A;    ENTEROSCOPY N/A 02/24/2021   Procedure: ENTEROSCOPY;  Surgeon: [name], MD;  Location: [facility] ENDOSCOPY;  Service: Gastroenterology;  Laterality: N/A;   ENTEROSCOPY N/A 03/26/2021   Procedure: ENTEROSCOPY;  Surgeon: Lavena Bullion, DO;  Location: [place];  Service: Gastroenterology;  Laterality: N/A;   ENTEROSCOPY N/A 07/05/2021   Procedure: ENTEROSCOPY;  Surgeon: Carol Ada, MD;  Location: [place];  Service: Gastroenterology;  Laterality: N/A;   ENTEROSCOPY N/A 08/06/2021   Procedure: ENTEROSCOPY;  Surgeon: [name], MD;  Location: [facility] ENDOSCOPY;  Service: Gastroenterology;  Laterality: N/A;   ENTEROSCOPY N/A 08/24/2021   Procedure: ENTEROSCOPY;  Surgeon: Ladene Artist, MD;  Location: [facility] ENDOSCOPY;  Service: Gastroenterology;  Laterality: N/A;   ENTEROSCOPY N/A 09/15/2021   Procedure: ENTEROSCOPY;  Surgeon: Sharyn Creamer, MD;  Location: [facility] ENDOSCOPY;  Service: Gastroenterology;  Laterality: N/A;   ENTEROSCOPY N/A 10/06/2021   Procedure: ENTEROSCOPY;  Surgeon: [name], MD;  Location: [facility] ENDOSCOPY;  Service: Gastroenterology;  Laterality: N/A;   ESOPHAGOGASTRODUODENOSCOPY N/A 08/10/2020   Procedure: ESOPHAGOGASTRODUODENOSCOPY (EGD);  Surgeon: Jerene Bears, MD;  Location: [facility] ENDOSCOPY;  Service: Gastroenterology;  Laterality: N/A;   ESOPHAGOGASTRODUODENOSCOPY (EGD) WITH PROPOFOL N/A 06/30/2019   Procedure: ESOPHAGOGASTRODUODENOSCOPY (EGD) WITH PROPOFOL;  Surgeon: Ronald Lobo, MD;  Location: [place];  Service: Endoscopy;  Laterality: N/A;   ESOPHAGOGASTRODUODENOSCOPY (EGD) WITH PROPOFOL N/A 01/12/2021   Procedure: ESOPHAGOGASTRODUODENOSCOPY (EGD) WITH PROPOFOL;  Surgeon: Sharyn Creamer, MD;  Location: [place];  Service: Gastroenterology;  Laterality: N/A;   ESOPHAGOGASTRODUODENOSCOPY (EGD) WITH PROPOFOL N/A 02/08/2021   Procedure: ESOPHAGOGASTRODUODENOSCOPY (EGD) WITH PROPOFOL;  Surgeon: Sharyn Creamer, MD;  Location: [place];   Service: Gastroenterology;  Laterality: N/A;   FISTULA SUPERFICIALIZATION Left 10/17/2019   Procedure: LEFT UPPER EXTREMITY FISTULA REVISION, SIDE BRANCH LIGATION,  AND SUPERFICIALIZATION;  Surgeon: Marty Heck, MD;  Location: [place];  Service: Vascular;  Laterality: Left;   FISTULA SUPERFICIALIZATION Left 97/98/9211   Procedure: PLICATION OF ANEURYSM LEFT ARTERIOVENOUS FISTULA;  Surgeon: Angelia Mould, MD;  Location: [place];  Service: Vascular;  Laterality: Left;   FLEXIBLE SIGMOIDOSCOPY N/A 07/05/2021   Procedure: FLEXIBLE SIGMOIDOSCOPY;  Surgeon: Carol Ada, MD;  Location: [place];  Service: Gastroenterology;  Laterality: N/A;   GIVENS CAPSULE STUDY N/A 06/30/2019   Procedure: GIVENS CAPSULE STUDY;  Surgeon: Ronald Lobo, MD;  Location: [place];  Service: Endoscopy;  Laterality: N/A;   GIVENS CAPSULE STUDY N/A 08/25/2021   Procedure: GIVENS CAPSULE STUDY;  Surgeon: Ladene Artist, MD;  Location: [place];  Service: Gastroenterology;  Laterality: N/A;   GIVENS CAPSULE STUDY N/A 10/06/2021   Procedure: GIVENS CAPSULE STUDY;  Surgeon: [name], MD;  Location: [place];  Service: Gastroenterology;  Laterality: N/A;   HEMOSTASIS CLIP PLACEMENT  08/10/2020   Procedure: HEMOSTASIS CLIP PLACEMENT;  Surgeon: Jerene Bears, MD;  Location: [place];  Service: Gastroenterology;;   HEMOSTASIS CLIP PLACEMENT  01/12/2021   Procedure: HEMOSTASIS CLIP PLACEMENT;  Surgeon: Sharyn Creamer, MD;  Location: [place];  Service: Gastroenterology;;   HEMOSTASIS CONTROL  08/02/2019   Procedure: HEMOSTASIS CONTROL;  Surgeon: Yetta Flock, MD;  Location: [place];  Service: Gastroenterology;;   HOT HEMOSTASIS  02/24/2021   Procedure: HOT HEMOSTASIS (ARGON PLASMA COAGULATION/BICAP);  Surgeon: [name], MD;  Location: [facility] ENDOSCOPY;  Service: Gastroenterology;;   HOT HEMOSTASIS N/A 03/26/2021   Procedure: HOT HEMOSTASIS (ARGON PLASMA  COAGULATION/BICAP);  Surgeon: Lavena Bullion, DO;  Location: [facility] ENDOSCOPY;  Service: Gastroenterology;  Laterality: N/A;   HOT HEMOSTASIS N/A 07/05/2021   Procedure: HOT HEMOSTASIS (ARGON PLASMA COAGULATION/BICAP);  Surgeon: Carol Ada, MD;  Location: [place];  Service: Gastroenterology;  Laterality: N/A;   HOT HEMOSTASIS N/A 09/15/2021   Procedure: HOT HEMOSTASIS (ARGON PLASMA COAGULATION/BICAP);  Surgeon: Sharyn Creamer, MD;  Location: [place];  Service: Gastroenterology;  Laterality: N/A;   HOT HEMOSTASIS N/A 10/06/2021   Procedure: HOT HEMOSTASIS (ARGON PLASMA COAGULATION/BICAP);  Surgeon: [name], MD;  Location: [place];  Service: Gastroenterology;  Laterality: N/A;   INCISION AND DRAINAGE ABSCESS N/A 06/29/2016   Procedure: INCISION AND DRAINAGE ABDOMINAL WALL ABSCESS;  Surgeon: Alphonsa Overall, MD;  Location: [facility];  Service: General;  Laterality: N/A;   INSERTION OF DIALYSIS CATHETER Right 08/03/2019   Procedure: INSERTION OF DIALYSIS CATHETER;  Surgeon: Waynetta Sandy, MD;  Location: [place];  Service: Vascular;  Laterality: Right;   INSERTION OF DIALYSIS CATHETER Right 10/22/2019   Procedure: INSERTION OF 23CM TUNNELED DIALYSIS CATHETER RIGHT INTERNAL JUGULAR;  Surgeon: Angelia Mould, MD;  Location: [place];  Service: Vascular;  Laterality: Right;   INSERTION OF DIALYSIS CATHETER Right 08/12/2020   Procedure: INSERTION OF Right internal Jugular TUNNELED  DIALYSIS CATHETER.;  Surgeon: Waynetta Sandy, MD;  Location: [place];  Service: Vascular;  Laterality: Right;   INSERTION OF DIALYSIS CATHETER N/A 04/19/2021   Procedure: INSERTION OF TUNNELED DIALYSIS CATHETER;  Surgeon: Angelia Mould, MD;  Location: [facility];  Service: Vascular;  Laterality: N/A;   IR 3D INDEPENDENT WKST  11/07/2021   IR ANGIOGRAM SELECTIVE EACH ADDITIONAL VESSEL  11/07/2021   IR ANGIOGRAM SELECTIVE EACH ADDITIONAL VESSEL  11/07/2021   IR ANGIOGRAM SELECTIVE EACH  ADDITIONAL VESSEL  11/07/2021   IR ANGIOGRAM VISCERAL SELECTIVE  11/07/2021   IR EMBO ARTERIAL NOT HEMORR HEMANG INC GUIDE ROADMAPPING  11/07/2021   IR RADIOLOGIST EVAL & MGMT  09/12/2021   IR US GUIDE VASC ACCESS LEFT  11/07/2021   Left heel surgery     LOWER EXTREMITY ANGIOGRAPHY N/A 07/08/2021   Procedure: Lower Extremity Angiography;  Surgeon: Cherre Robins, MD;  Location: [place] [facility];  Service: Cardiovascular;  Laterality: N/A;   PENILE BIOPSY N/A 03/26/2020   Procedure: PENILE ULCER DEBRIDEMENT;  Surgeon: Remi Haggard, MD;  Location: [facility];  Service: Urology;  Laterality: N/A;  30 MINS   PERIPHERAL VASCULAR INTERVENTION Right 07/08/2021   Procedure: PERIPHERAL VASCULAR INTERVENTION;  Surgeon: Cherre Robins, MD;  Location: [place] [facility];  Service: Cardiovascular;  Laterality: Right;   SCLEROTHERAPY  01/12/2021   Procedure: SCLEROTHERAPY;  Surgeon: Sharyn Creamer, MD;  Location: [facility] ENDOSCOPY;  Service: Gastroenterology;;   Clide Deutscher  02/24/2021   Procedure: Clide Deutscher;  Surgeon: [name], MD;  Location: [place];  Service: Gastroenterology;;   SUBMUCOSAL TATTOO INJECTION  08/24/2021   Procedure: SUBMUCOSAL TATTOO INJECTION;  Surgeon: Ladene Artist, MD;  Location: [place];  Service: Gastroenterology;;   SUBMUCOSAL TATTOO INJECTION  09/15/2021   Procedure: SUBMUCOSAL TATTOO INJECTION;  Surgeon: Sharyn Creamer, MD;  Location: [facility] ENDOSCOPY;  Service: Gastroenterology;;    Social History:  reports that he has been smoking cigarettes. He has a 22.50 pack-year smoking history. He has never used smokeless tobacco. He reports that he does not currently use alcohol. He reports that he does not currently use drugs after having used the following drugs: "Crack" cocaine.  Allergies  Allergen Reactions   Seroquel [Quetiapine] Other (See Comments)    Tardive kinesia/dystonia   Dilaudid [Hydromorphone Hcl] Itching and Other (See Comments)    Pt reports  itchiness after IM injection     Family History  Problem Relation Age of Onset   Colon cancer Neg Hx     Prior to Admission medications   Medication Sig Start Date End Date Taking? Authorizing Provider  amLODipine (NORVASC) 5 MG tablet Take 1 tablet (5 mg total) by mouth daily. 09/18/21 12/17/21  [place] Donnamarie Poag, DO  atorvastatin (LIPITOR) 40 MG tablet Take 1 tablet (40 mg total) by mouth daily. 09/18/21 12/17/21  [place] Donnamarie Poag, DO  gabapentin (NEURONTIN) 300 MG capsule Take 1 capsule (300 mg total) by mouth 3 (three) times daily. 11/07/21   [name], NP  loperamide (IMODIUM A-D) 2 MG tablet Take 1 tablet (2 mg total) by mouth 4 (four) times daily as needed for diarrhea or loose stools. 10/22/21   [name], NP  pantoprazole (PROTONIX) 40 MG tablet Take 1 tablet (40 mg total) by mouth 2 (two) times daily before a meal. 10/10/21 01/08/22  Mercy Riding, MD  traZODone (DESYREL) 50 MG tablet Take 1 tablet (50 mg total) by mouth at bedtime as needed for sleep. 10/22/21   [name], NP  colchicine 0.6 MG tablet Take 0.5 tablets (  0.3 mg total) by mouth 2 (two) times daily. 07/24/20 07/29/20  Noemi Chapel, MD  furosemide (LASIX) 40 MG tablet Take 1 tablet (40 mg total) by mouth daily. 07/25/19 02/09/20  Ladona Horns, MD    Physical Exam:  Vitals:   11/12/21 2230 11/12/21 2300 11/13/21 0153 11/13/21 0200  BP: (!) 138/121 (!) 195/104  (!) 161/67  Pulse: 81 82  87  Resp:    16  Temp:   98.4 F (36.9 C)   TempSrc:   Oral   SpO2: 98% 98%  100%  Weight:      Height:        Constitutional: Awake alert and oriented x3, patient is in distress due to neck pain Skin: no rashes, no lesions, poor skin turgor noted. Eyes: Pupils are equally reactive to light.  Increased conjunctival pallor without evidence of scleral icterus. ENMT: Moist mucous membranes noted.  Posterior pharynx clear of any exudate or lesions.   Neck: Some notable pain of the posterior neck with passive and  active range of motion without crepitus.  Notable tenderness on palpation along cervical spine.  Normal, supple, no masses, no thyromegaly.  No evidence of jugular venous distension.  Kerning and Brudzinski's are negative. Respiratory: clear to auscultation bilaterally, no wheezing, no crackles. Normal respiratory effort. No accessory muscle use.  Cardiovascular: Regular rate and rhythm, no murmurs / rubs / gallops. No extremity edema. 2+ pedal pulses. No carotid bruits.  Chest:   Nontender without crepitus or deformity.   Back:   Nontender without crepitus or deformity. Abdomen: Abdomen is soft and nontender.  No evidence of intra-abdominal masses.  Positive bowel sounds noted in all quadrants.   Musculoskeletal: Evidence of left BKA.  Otherwise, no joint deformity upper and lower extremities. Good ROM, no contractures. Normal muscle tone.  Neurologic: CN 2-12 grossly intact. Sensation intact.  Patient moving all 4 extremities spontaneously.  Patient is following all commands.  Patient is responsive to verbal stimuli.   Psychiatric: Patient exhibits normal mood with appropriate affect.  Patient seems to possess insight as to their current situation.    Data Reviewed:  I have personally reviewed and interpreted labs, imaging.  Significant findings are:  Chemistry revealing sodium 136, potassium 4.2, chloride 93, bicarbonate 17, glucose 87, BUN 59, creatinine 10.25. CBC revealing white blood cell count of 4.5, hemoglobin 7.2, hematocrit 21.8, platelet count of 109. Stool Hemoccult positive.   EKG: Personally reviewed.  Rhythm is normal sinus rhythm with heart rate of 82 bpm.  Evidence of QTc prolongation at 533 ms.  No dynamic ST segment changes appreciated.   Assessment and Plan: * Acute blood loss anemia Patient experiencing progressively worsening anemia with 2-day history of melena Known history of hepatitis C cirrhosis with numerous hospitalizations for bleeding complications with  portal hypertensive gastropathy, duodenal ulceration, stomach ulceration and multiple AVMs in the past requiring EGD intervention with APC Last presentation with bleeding requiring EGD with intervention was in mid June. ER provider has already initiated Protonix 40 mg IV every 12 hours We will keep patient n.p.o. after midnight ER provider has contacted Dr. Bryan Lemma with [facility] gastroenterology who stated that his group will evaluate patient in the morning in consultation to determine if repeat endoscopic intervention is necessary Performing CBCs every 6 hours We will transfuse if hemoglobin drops below 7 Avoiding anticoagulants  ESRD (end stage renal disease) on dialysis [facility]) Patient reports missing his last session of dialysis due to severe neck pain Patient typically receives hemodialysis Monday  Wednesdays and Fridays We will formally consult nephrology in the morning for resumption of dialysis  Musculoskeletal neck pain Patient presenting with several day history of posterior neck pain Kernig's and Brudzinski's sign's are negative.  Patient exhibits no photophobia or fever.  Meningitis unlikely. No recent trauma. Exam most consistent with musculoskeletal cause of pain, possibly secondary to muscle spasm Provided patient with as needed Robaxin and low doses of oxycodone for now.  Prolonged QT interval Known history of prolonged QT Avoiding QT prolonging agents Monitoring on telemetry Monitoring and replacing electrolytes as necessary  Essential hypertension Resume patients home regimen of oral antihypertensives Titrate antihypertensive regimen as necessary to achieve adequate BP control PRN intravenous antihypertensives for excessively elevated blood pressure    Mixed hyperlipidemia Continuing home regimen of lipid lowering therapy.   GERD without esophagitis Providing patient with PPI therapy as noted above  Nicotine dependence, cigarettes, uncomplicated Counseling  on cessation  Chronic hepatitis C without hepatic coma [facility]) Outpatient follow-up concerning this with gastroenterology.  Right lower lobe lung mass Outpatient follow-up with Dr. Valeta Harms with pulmonology       Code Status:  Full code  code status decision has been confirmed with: patient Family Communication: deferred   Consults: Dr. Bryan Lemma with [facility]  Severity of Illness:  The appropriate patient status for this patient is OBSERVATION. Observation status is judged to be reasonable and necessary in order to provide the required intensity of service to ensure the patient's safety. The patient's presenting symptoms, physical exam findings, and initial radiographic and laboratory data in the context of their medical condition is felt to place them at decreased risk for further clinical deterioration. Furthermore, it is anticipated that the patient will be medically stable for discharge from the hospital within 2 midnights of admission.   Author:  Vernelle Emerald MD  11/13/2021 2:44 AM

## 2021-11-13 NOTE — Assessment & Plan Note (Signed)
   Counseling on cessation

## 2021-11-13 NOTE — Consult Note (Addendum)
[facility] Consult: 8:43 AM 11/13/2021  LOS: 0 days    Referring Provider: Dr Verlon Au  Primary Care Physician:  [name], NP Primary Gastroenterologist:  unassigned, no outpt GI encounters.       Reason for Consultation: Dark stools, progressive anemia.  Encounters with [name] but more recently [place]   HPI: Daniel [name] is a 61 y.o. male.  PMH listed below.  Previous Plavix following right SFA stenting March 2023 since discontinued due to recurrent GIB.  Intra cerebral hemorrhage.  ESRD, hemodialysis MWF.  Chronic anemia (IDA, chronic dz and GIB sources).  Recurrent GI bleeding with findings of duodenal ulcer, small bowel AVMs, erosive esophagitis, hyperplastic duodenal polyps on previous endo studies.  HCV, quantitative 9,880,000 in 07/2021 (no treatment thus far).  Cirrhosis.  HCCA.     06/2019 colonoscopy: sigmoid diverticulosis, possible old blood in terminal ileum suggesting SB source.  Barrett's esophagus on 01/2021 EGD.  Latest GI studies include but there are several others dating to March 2021. 07/05/2021 SBE.  Friable gastric mucosa, normal esophagus.  A few, recently bleeding AVMs in duodenum treated with monopolar probe.  Single nonbleeding jejunal AVM treated with probe 07/05/2021 flex sig with blood in rectum, rectosigmoid and sigmoid. 08/06/21 SBE: Severe, circumferential esophagitis without bleeding.  Pill esophagitis suspected.  Friable gastric mucosa/gastritis.  Multiple friable duodenal polyps bled on contact and possible source of GI bleed.  Examined jejunum normal.  Pathology showed Candida esophagitis with erosion.  Gastric polyps were hyperplastic with focal erosion at surface, no H. pylori.  Duodenal polyps also hyperplastic, no features of celiac disease on duodenal  biopsies. 08/24/2021 SBE.  Distal extent tattooed.  Grade 1 esophageal varices.  Portal hypertensive gastropathy.  Duodenal polyps. 08/25/2021 VCE.  Duodenal polyps.  Small red spot vs diminutive AVM without blood anywhere.  Green liquid secretions and stool throughout the bowel.  Study considered nondiagnostic 09/15/2021 SBE.  3 duodenal AVMs treated with APC and felt to be likely source of bleeding.  Small esophageal varices, portal hypertensive gastropathy and solitary gastric polyp. 10/06/2021 VCE, capsule placed using endoscope.  Capsule moved back into the stomach then into the duodenum at about 6 hours and 20-minute 9 minutes then back into stomach where battery life of 12 hours expired.  Visualized previously cauterized sites of pre-pyloric/duodenal polyps, duodenal AVM w heme in region.  Dr. Carlean Purl chose not to pursue additional endoscopy.  Felt pt would need deep enteroscopy which may require tertiary care facility.  He said "afraid he will have recurrent problems regardless" 08/05/2021 CT angio abdomen pelvis.  No active bleeding.  Outpouching near expected position of duodenal bulb or post bulbar duodenum, suspicious for focal duodenal ulcer.  Adjacent retroperitoneal fat edema.  4.4 cm mass near juncture of liver segments IVb and VIII as well as a possible adjacent 2.2 cm mass in segment VIII.  Differential includes hepatocellular carcinoma, neuroendocrine carcinoma.  Not excluded for cholangiocarcinoma and metastatic disease. 08/07/21 MRI abd, MRCP: Cirrhosis.  5 cm mass at segment 4A is LI-RADS 5, c/w hepatocellular carcinoma.  2  cm mass in the central right hepatic lobe LI-RADS 3. Consider follow-up in 3-6 months 10/08/2021: Extensive multifocal pulmonary consolidation, infiltrate progressive compared with previous, bilateral pleural effusions.  Stable mild peripancreatic edema in pancreaticoduodenal groove, Querry mild edema/interstitial pancreatitis.  Central hepatic masses not well delineated on  noncontrast study.  Cirrhosis.  Cholelithiasis.  Nonobstructing left nephrolithiasis AFP 13.9 in mid April 2023, 10.5 in early May 2023 Underwent Y90 treatment for [facility] on 7/13, next session due 7/25  Last hospital GI encounter was 5 weeks ago.  At that point the repeat, essentially failed, VCE was performed.  Received 2 PRBCs with Hb 6.9 .. 8.6.  8.9 on 6/29, 7.8 last week, 7.2 yesterday.  6.7 this AM.  Splenomegaly.  Trace ascites.  Small bilateral pleural effusions.  SQ edema of abdominal wall Last dialysis session was on Friday, 6 days ago.  He did not feel well, nothing specific but sounds like malaise, so did not go to dialysis on Monday.  For couple of days he has been passing black stools last episode was yesterday evening.  No nausea or vomiting.  No significant abdominal pain.  This presentation similar to multiple prior presentations for GI bleed.  Arrived in the ED yesterday morning.  Hgb 6.7.  Platelets 108.  Normal MCV.  INR normal 1.1 T. bili 0.7.  Alk phos 122.  AST/ALT of 44/16: all in line w prior readings.      Past Medical History:  Diagnosis Date   Anemia    Diabetes mellitus without complication [facility])    patient denies   Dialysis patient [facility])    End stage chronic kidney disease ([place])    Hypertension    ICH (intracerebral hemorrhage) ([place]) 05/20/2017   PAD (peripheral artery disease) ([facility])    Shoulder pain, left 06/28/2013    Past Surgical History:  Procedure Laterality Date   A/V FISTULAGRAM N/A 08/15/2020   Procedure: A/V FISTULAGRAM - Left Upper;  Surgeon: Cherre Robins, MD;  Location: [place] CV LAB;  Service: Cardiovascular;  Laterality: N/A;   ABDOMINAL AORTOGRAM W/LOWER EXTREMITY N/A 04/08/2021   Procedure: ABDOMINAL AORTOGRAM W/LOWER EXTREMITY;  Surgeon: Waynetta Sandy, MD;  Location: [place] CV LAB;  Service: Cardiovascular;  Laterality: N/A;   AMPUTATION Left 04/16/2021   Procedure: LEFT BELOW KNEE AMPUTATION;  Surgeon: Angelia Mould, MD;  Location: [place];  Service: Vascular;  Laterality: Left;   AMPUTATION Left 06/17/2021   Procedure: REVISION AMPUTATION BELOW KNEE;  Surgeon: Broadus John, MD;  Location: [place];  Service: Vascular;  Laterality: Left;   APPENDECTOMY     APPLICATION OF WOUND VAC Left 06/17/2021   Procedure: APPLICATION OF WOUND VAC;  Surgeon: Broadus John, MD;  Location: [place];  Service: Vascular;  Laterality: Left;   AV FISTULA PLACEMENT Left 08/03/2019   Procedure: LEFT ARM ARTERIOVENOUS (AV) CEPHALIC  FISTULA CREATION;  Surgeon: Waynetta Sandy, MD;  Location: [place];  Service: Vascular;  Laterality: Left;   BIOPSY  06/30/2019   Procedure: BIOPSY;  Surgeon: Ronald Lobo, MD;  Location: [facility] ENDOSCOPY;  Service: Endoscopy;;   BIOPSY  08/02/2019   Procedure: BIOPSY;  Surgeon: Yetta Flock, MD;  Location: [facility] ENDOSCOPY;  Service: Gastroenterology;;   BIOPSY  02/08/2021   Procedure: BIOPSY;  Surgeon: Sharyn Creamer, MD;  Location: [facility] ENDOSCOPY;  Service: Gastroenterology;;   BIOPSY  02/24/2021   Procedure: BIOPSY;  Surgeon: [name], MD;  Location: [place];  Service: Gastroenterology;;  BIOPSY  03/26/2021   Procedure: BIOPSY;  Surgeon: Lavena Bullion, DO;  Location: [place];  Service: Gastroenterology;;   BIOPSY  08/06/2021   Procedure: BIOPSY;  Surgeon: [name], MD;  Location: [place];  Service: Gastroenterology;;   BIOPSY  09/15/2021   Procedure: BIOPSY;  Surgeon: Sharyn Creamer, MD;  Location: [place];  Service: Gastroenterology;;   COLONOSCOPY  01/23/2012   Procedure: COLONOSCOPY;  Surgeon: Danie Binder, MD;  [facility];  Service: Endoscopy;  Laterality: N/A;  11:10 AM   COLONOSCOPY WITH PROPOFOL N/A 06/30/2019   Procedure: COLONOSCOPY WITH PROPOFOL;  Surgeon: Ronald Lobo, MD;  Location: [place];  Service: Endoscopy;  Laterality: N/A;   ENTEROSCOPY N/A 08/02/2019   Procedure: ENTEROSCOPY;  Surgeon:  Yetta Flock, MD;  Location: [facility] ENDOSCOPY;  Service: Gastroenterology;  Laterality: N/A;   ENTEROSCOPY N/A 02/24/2021   Procedure: ENTEROSCOPY;  Surgeon: [name], MD;  Location: [facility] ENDOSCOPY;  Service: Gastroenterology;  Laterality: N/A;   ENTEROSCOPY N/A 03/26/2021   Procedure: ENTEROSCOPY;  Surgeon: Lavena Bullion, DO;  Location: [place];  Service: Gastroenterology;  Laterality: N/A;   ENTEROSCOPY N/A 07/05/2021   Procedure: ENTEROSCOPY;  Surgeon: Carol Ada, MD;  Location: [place];  Service: Gastroenterology;  Laterality: N/A;   ENTEROSCOPY N/A 08/06/2021   Procedure: ENTEROSCOPY;  Surgeon: [name], MD;  Location: [facility] ENDOSCOPY;  Service: Gastroenterology;  Laterality: N/A;   ENTEROSCOPY N/A 08/24/2021   Procedure: ENTEROSCOPY;  Surgeon: Ladene Artist, MD;  Location: [facility] ENDOSCOPY;  Service: Gastroenterology;  Laterality: N/A;   ENTEROSCOPY N/A 09/15/2021   Procedure: ENTEROSCOPY;  Surgeon: Sharyn Creamer, MD;  Location: [facility] ENDOSCOPY;  Service: Gastroenterology;  Laterality: N/A;   ENTEROSCOPY N/A 10/06/2021   Procedure: ENTEROSCOPY;  Surgeon: [name], MD;  Location: [facility] ENDOSCOPY;  Service: Gastroenterology;  Laterality: N/A;   ESOPHAGOGASTRODUODENOSCOPY N/A 08/10/2020   Procedure: ESOPHAGOGASTRODUODENOSCOPY (EGD);  Surgeon: Jerene Bears, MD;  Location: [facility] ENDOSCOPY;  Service: Gastroenterology;  Laterality: N/A;   ESOPHAGOGASTRODUODENOSCOPY (EGD) WITH PROPOFOL N/A 06/30/2019   Procedure: ESOPHAGOGASTRODUODENOSCOPY (EGD) WITH PROPOFOL;  Surgeon: Ronald Lobo, MD;  Location: [place];  Service: Endoscopy;  Laterality: N/A;   ESOPHAGOGASTRODUODENOSCOPY (EGD) WITH PROPOFOL N/A 01/12/2021   Procedure: ESOPHAGOGASTRODUODENOSCOPY (EGD) WITH PROPOFOL;  Surgeon: Sharyn Creamer, MD;  Location: [place];  Service: Gastroenterology;  Laterality: N/A;   ESOPHAGOGASTRODUODENOSCOPY (EGD) WITH PROPOFOL N/A 02/08/2021   Procedure:  ESOPHAGOGASTRODUODENOSCOPY (EGD) WITH PROPOFOL;  Surgeon: Sharyn Creamer, MD;  Location: [place];  Service: Gastroenterology;  Laterality: N/A;   FISTULA SUPERFICIALIZATION Left 10/17/2019   Procedure: LEFT UPPER EXTREMITY FISTULA REVISION, SIDE BRANCH LIGATION,  AND SUPERFICIALIZATION;  Surgeon: Marty Heck, MD;  Location: [place];  Service: Vascular;  Laterality: Left;   FISTULA SUPERFICIALIZATION Left 20/23/3435   Procedure: PLICATION OF ANEURYSM LEFT ARTERIOVENOUS FISTULA;  Surgeon: Angelia Mould, MD;  Location: [place];  Service: Vascular;  Laterality: Left;   FLEXIBLE SIGMOIDOSCOPY N/A 07/05/2021   Procedure: FLEXIBLE SIGMOIDOSCOPY;  Surgeon: Carol Ada, MD;  Location: [place];  Service: Gastroenterology;  Laterality: N/A;   GIVENS CAPSULE STUDY N/A 06/30/2019   Procedure: GIVENS CAPSULE STUDY;  Surgeon: Ronald Lobo, MD;  Location: [place];  Service: Endoscopy;  Laterality: N/A;   GIVENS CAPSULE STUDY N/A 08/25/2021   Procedure: GIVENS CAPSULE STUDY;  Surgeon: Ladene Artist, MD;  Location: [place];  Service: Gastroenterology;  Laterality: N/A;   GIVENS CAPSULE STUDY N/A 10/06/2021   Procedure:  GIVENS CAPSULE STUDY;  Surgeon: [name], MD;  Location: [place];  Service: Gastroenterology;  Laterality: N/A;   HEMOSTASIS CLIP PLACEMENT  08/10/2020   Procedure: HEMOSTASIS CLIP PLACEMENT;  Surgeon: Jerene Bears, MD;  Location: [place];  Service: Gastroenterology;;   HEMOSTASIS CLIP PLACEMENT  01/12/2021   Procedure: HEMOSTASIS CLIP PLACEMENT;  Surgeon: Sharyn Creamer, MD;  Location: [place];  Service: Gastroenterology;;   HEMOSTASIS CONTROL  08/02/2019   Procedure: HEMOSTASIS CONTROL;  Surgeon: Yetta Flock, MD;  Location: [place];  Service: Gastroenterology;;   HOT HEMOSTASIS  02/24/2021   Procedure: HOT HEMOSTASIS (ARGON PLASMA COAGULATION/BICAP);  Surgeon: [name], MD;  Location: [facility] ENDOSCOPY;  Service:  Gastroenterology;;   HOT HEMOSTASIS N/A 03/26/2021   Procedure: HOT HEMOSTASIS (ARGON PLASMA COAGULATION/BICAP);  Surgeon: Lavena Bullion, DO;  Location: [facility] ENDOSCOPY;  Service: Gastroenterology;  Laterality: N/A;   HOT HEMOSTASIS N/A 07/05/2021   Procedure: HOT HEMOSTASIS (ARGON PLASMA COAGULATION/BICAP);  Surgeon: Carol Ada, MD;  Location: [place];  Service: Gastroenterology;  Laterality: N/A;   HOT HEMOSTASIS N/A 09/15/2021   Procedure: HOT HEMOSTASIS (ARGON PLASMA COAGULATION/BICAP);  Surgeon: Sharyn Creamer, MD;  Location: [place];  Service: Gastroenterology;  Laterality: N/A;   HOT HEMOSTASIS N/A 10/06/2021   Procedure: HOT HEMOSTASIS (ARGON PLASMA COAGULATION/BICAP);  Surgeon: [name], MD;  Location: [place];  Service: Gastroenterology;  Laterality: N/A;   INCISION AND DRAINAGE ABSCESS N/A 06/29/2016   Procedure: INCISION AND DRAINAGE ABDOMINAL WALL ABSCESS;  Surgeon: Alphonsa Overall, MD;  Location: [facility];  Service: General;  Laterality: N/A;   INSERTION OF DIALYSIS CATHETER Right 08/03/2019   Procedure: INSERTION OF DIALYSIS CATHETER;  Surgeon: Waynetta Sandy, MD;  Location: [place];  Service: Vascular;  Laterality: Right;   INSERTION OF DIALYSIS CATHETER Right 10/22/2019   Procedure: INSERTION OF 23CM TUNNELED DIALYSIS CATHETER RIGHT INTERNAL JUGULAR;  Surgeon: Angelia Mould, MD;  Location: [place];  Service: Vascular;  Laterality: Right;   INSERTION OF DIALYSIS CATHETER Right 08/12/2020   Procedure: INSERTION OF Right internal Jugular TUNNELED  DIALYSIS CATHETER.;  Surgeon: Waynetta Sandy, MD;  Location: [place];  Service: Vascular;  Laterality: Right;   INSERTION OF DIALYSIS CATHETER N/A 04/19/2021   Procedure: INSERTION OF TUNNELED DIALYSIS CATHETER;  Surgeon: Angelia Mould, MD;  Location: [place];  Service: Vascular;  Laterality: N/A;   IR 3D INDEPENDENT WKST  11/07/2021   IR ANGIOGRAM SELECTIVE EACH ADDITIONAL VESSEL  11/07/2021    IR ANGIOGRAM SELECTIVE EACH ADDITIONAL VESSEL  11/07/2021   IR ANGIOGRAM SELECTIVE EACH ADDITIONAL VESSEL  11/07/2021   IR ANGIOGRAM VISCERAL SELECTIVE  11/07/2021   IR EMBO ARTERIAL NOT HEMORR HEMANG INC GUIDE ROADMAPPING  11/07/2021   IR RADIOLOGIST EVAL & MGMT  09/12/2021   IR US GUIDE VASC ACCESS LEFT  11/07/2021   Left heel surgery     LOWER EXTREMITY ANGIOGRAPHY N/A 07/08/2021   Procedure: Lower Extremity Angiography;  Surgeon: Cherre Robins, MD;  Location: [place] CV [facility];  Service: Cardiovascular;  Laterality: N/A;   PENILE BIOPSY N/A 03/26/2020   Procedure: PENILE ULCER DEBRIDEMENT;  Surgeon: Remi Haggard, MD;  Location[facility];  Service: Urology;  Laterality: N/A;  30 MINS   PERIPHERAL VASCULAR INTERVENTION Right 07/08/2021   Procedure: PERIPHERAL VASCULAR INTERVENTION;  Surgeon: Cherre Robins, MD;  Location: [place] [facility] LAB;  Service: Cardiovascular;  Laterality: Right;   SCLEROTHERAPY  01/12/2021   Procedure: Clide Deutscher;  Surgeon: Lorenso Courier,  Grace Blight, MD;  Location: [facility] ENDOSCOPY;  Service: Gastroenterology;;   Clide Deutscher  02/24/2021   Procedure: Clide Deutscher;  Surgeon: [name], MD;  Location: [place];  Service: Gastroenterology;;   SUBMUCOSAL TATTOO INJECTION  08/24/2021   Procedure: SUBMUCOSAL TATTOO INJECTION;  Surgeon: Ladene Artist, MD;  Location: [place];  Service: Gastroenterology;;   SUBMUCOSAL TATTOO INJECTION  09/15/2021   Procedure: SUBMUCOSAL TATTOO INJECTION;  Surgeon: Sharyn Creamer, MD;  Location: [facility] ENDOSCOPY;  Service: Gastroenterology;;    Prior to Admission medications   Medication Sig Start Date End Date Taking? Authorizing Provider  amLODipine (NORVASC) 5 MG tablet Take 1 tablet (5 mg total) by mouth daily. 09/18/21 12/17/21  [place] Donnamarie Poag, DO  atorvastatin (LIPITOR) 40 MG tablet Take 1 tablet (40 mg total) by mouth daily. 09/18/21 12/17/21  [place] Donnamarie Poag, DO  gabapentin (NEURONTIN) 300 MG capsule Take 1 capsule (300 mg  total) by mouth 3 (three) times daily. 11/07/21   [name], NP  loperamide (IMODIUM A-D) 2 MG tablet Take 1 tablet (2 mg total) by mouth 4 (four) times daily as needed for diarrhea or loose stools. 10/22/21   [name], NP  pantoprazole (PROTONIX) 40 MG tablet Take 1 tablet (40 mg total) by mouth 2 (two) times daily before a meal. 10/10/21 01/08/22  Mercy Riding, MD  traZODone (DESYREL) 50 MG tablet Take 1 tablet (50 mg total) by mouth at bedtime as needed for sleep. 10/22/21   [name], NP  colchicine 0.6 MG tablet Take 0.5 tablets (0.3 mg total) by mouth 2 (two) times daily. 07/24/20 07/29/20  Noemi Chapel, MD  furosemide (LASIX) 40 MG tablet Take 1 tablet (40 mg total) by mouth daily. 07/25/19 02/09/20  Ladona Horns, MD    Scheduled Meds:  amLODipine  5 mg Oral Daily   atorvastatin  40 mg Oral Daily   gabapentin  300 mg Oral QHS   pantoprazole (PROTONIX) IV  40 mg Intravenous Q12H   Infusions:  PRN Meds: acetaminophen **OR** acetaminophen, hydrALAZINE, methocarbamol, oxyCODONE, polyethylene glycol, traZODone   Allergies as of 11/12/2021 - Review Complete 11/12/2021  Allergen Reaction Noted   Seroquel [quetiapine] Other (See Comments) 04/22/2021   Dilaudid [hydromorphone hcl] Itching and Other (See Comments) 07/29/2020    Family History  Problem Relation Age of Onset   Colon cancer Neg Hx     Social History   Socioeconomic History   Marital status: Single    Spouse name: Not on file   Number of children: 2   Years of education: 12   Highest education level: 12th grade  Occupational History   Occupation: disabled  Tobacco Use   Smoking status: Every Day    Packs/day: 0.50    Years: 45.00    Total pack years: 22.50    Types: Cigarettes   Smokeless tobacco: Never   Tobacco comments:    Pt left before information given  Vaping Use   Vaping Use: Never used  Substance and Sexual Activity   Alcohol use: Not Currently   Drug use: Not Currently     Types: "Crack" cocaine    Comment: last in 2020   Sexual activity: Yes    Birth control/protection: None  Other Topics Concern   Not on file  Social History Narrative   Goes to dialysis M-W-F, [facility] using a wheelchair at present, waiting for prosthetic. Looking for handicapped housing for immediate move in.   Social Determinants of Health   Financial  Resource Strain: High Risk (11/01/2021)   Overall Financial Resource Strain (CARDIA)    Difficulty of Paying Living Expenses: Hard  Food Insecurity: No Food Insecurity (11/01/2021)   Hunger Vital Sign    Worried About Running Out of Food in the Last Year: Never true    Ran Out of Food in the Last Year: Never true  Transportation Needs: No Transportation Needs (11/01/2021)   PRAPARE - Hydrologist (Medical): No    Lack of Transportation (Non-Medical): No  Physical Activity: Insufficiently Active (11/01/2021)   Exercise Vital Sign    Days of Exercise per Week: 1 day    Minutes of Exercise per Session: 20 min  Stress: No Stress Concern Present (11/01/2021)   [place]    Feeling of Stress : Not at all  Social Connections: Socially Isolated (11/01/2021)   Social Connection and Isolation Panel [NHANES]    Frequency of Communication with Friends and Family: More than three times a week    Frequency of Social Gatherings with Friends and Family: More than three times a week    Attends Religious Services: Never    Marine scientist or Organizations: No    Attends Archivist Meetings: Never    Marital Status: Divorced  Human resources officer Violence: Not At Risk (11/01/2021)   Humiliation, Afraid, Rape, and Kick questionnaire    Fear of Current or Ex-Partner: No    Emotionally Abused: No    Physically Abused: No    Sexually Abused: No    REVIEW OF SYSTEMS: Constitutional: Malaise, weakness for several days. ENT:  No nose bleeds Pulm: Denies  shortness of breath, active cough. CV:  No palpitations, no LE edema.  No angina GU:  No hematuria, no frequency GI: See HPI. Heme: Other than the black stools, has not had any other excessive bleeding or bruising. Transfusions: On multiple occasions. Neuro:  No headaches, no peripheral tingling or numbness.  No syncope, no seizures Derm:  No itching, no rash or sores.  Endocrine:  No sweats or chills.  No polyuria or dysuria Immunization: Reviewed. Travel: Not queried.   PHYSICAL EXAM: Vital signs in last 24 hours: Vitals:   11/13/21 0535 11/13/21 0757  BP: (!) 180/88 119/84  Pulse: 82 77  Resp: 19 16  Temp: 97.9 F (36.6 C) 98 F (36.7 C)  SpO2: 100% 99%   Wt Readings from Last 3 Encounters:  11/12/21 108.9 kg  11/07/21 99.8 kg  10/24/21 97.5 kg    General: Patient looks somewhat chronically ill.  He is resting comfortably on stretcher in ED room. Head: No facial asymmetry or swelling.  No signs of head trauma. Eyes: No scleral icterus.  No conjunctival pallor. Ears: No hearing deficit Nose: No congestion or discharge Mouth: Some dental caries but most teeth are intact.  Tongue is midline.  Mucosa is moist, pink, clear. Neck: No JVD, no masses, no thyromegaly Lungs: Clear bilaterally without labored breathing or cough Heart: RRR.  No MRG.  S1, S2 present Abdomen: Soft.  Slight tenderness without guarding or rebound in the right upper quadrant.  Do not appreciate hepatomegaly or liver masses.  Nondistended.  Active bowel sounds..   Rectal: Deferred.  Per ED physician, stool was brown on DRE, tested FOBT positive Musc/Skeltl: Left BKA Extremities: No right lower extremity edema.  Dialysis graft/access left upper extremity Neurologic: Oriented x3.  Good historian, excellent recall of dates.  Moves all 4  limbs without gross deficits, strength not tested. Skin: No rash, no sores, no telangiectasia. Nodes: No cervical adenopathy Psych: Cooperative, calm,  pleasant.  Intake/Output from previous day: 07/18 0701 - 07/19 0700 In: 32 [IV Piggyback:49] Out: -  Intake/Output this shift: Total I/O In: 315 [Blood:315] Out: -   LAB RESULTS: Recent Labs    11/12/21 0850 11/13/21 0351  WBC 4.5 4.0  HGB 7.2* 6.7*  HCT 21.8* 19.4*  PLT 109* 108*   BMET Lab Results  Component Value Date   NA 136 11/13/2021   NA 136 11/12/2021   NA 141 11/07/2021   K 4.1 11/13/2021   K 4.2 11/12/2021   K 3.8 11/07/2021   CL 96 (L) 11/13/2021   CL 93 (L) 11/12/2021   CL 98 11/07/2021   CO2 19 (L) 11/13/2021   CO2 17 (L) 11/12/2021   CO2 25 11/07/2021   GLUCOSE 95 11/13/2021   GLUCOSE 87 11/12/2021   GLUCOSE 89 11/07/2021   BUN 67 (H) 11/13/2021   BUN 59 (H) 11/12/2021   BUN 41 (H) 11/07/2021   CREATININE 12.18 (H) 11/13/2021   CREATININE 10.25 (H) 11/12/2021   CREATININE 6.94 (H) 11/07/2021   CALCIUM 8.3 (L) 11/13/2021   CALCIUM 8.5 (L) 11/12/2021   CALCIUM 8.5 (L) 11/07/2021   LFT Recent Labs    11/12/21 0850 11/13/21 0351  PROT 8.2* 8.1  ALBUMIN 2.5* 2.4*  AST 47* 44*  ALT 17 16  ALKPHOS 141* 122  BILITOT 1.2 0.7   PT/INR Lab Results  Component Value Date   INR 1.1 11/13/2021   INR 1.1 11/07/2021   INR 1.0 10/24/2021   Hepatitis Panel No results for input(s): "HEPBSAG", "HCVAB", "HEPAIGM", "HEPBIGM" in the last 72 hours. C-Diff No components found for: "CDIFF" Lipase     Component Value Date/Time   LIPASE 49 10/09/2021 0201    Drugs of Abuse     Component Value Date/Time   LABOPIA NONE DETECTED 03/29/2019 1209   COCAINSCRNUR POSITIVE (A) 03/29/2019 1209   LABBENZ NONE DETECTED 03/29/2019 1209   AMPHETMU NONE DETECTED 03/29/2019 1209   THCU NONE DETECTED 03/29/2019 1209   LABBARB NONE DETECTED 03/29/2019 1209     RADIOLOGY STUDIES: No results found.    IMPRESSION:     Recurrent GIB.  Known and previously treated duodenal AVMs, hyperplastic duodenal polyps.  Suspected to have AVMs deep in small bowel  amenable only to spiral endoscopy.    Recurrent ABL on top of anemia of chronic kidney dz.  Completed 1/1 PRBCs.    HCCA.  Multifocal.  Followed by [name].  Y90 treatment 7/13, next treatment 7/25.  Cirrhosis of the liver.  HCV with significant viral load in April.  On chart review this does not appear to have ever been treated.     ESRD.  On MWF hemodialysis.  Missed dialysis on Monday, due to dialysis today    PLAN:      Per Dr Loletha Carrow.  ? Utility of repeating SBE.  If pursued it would be tomorrow at soonest so ordering patient renal diet.  Switch to his normal Protonix 40 mg po bid.      Azucena Freed  11/13/2021, 8:43 AM Phone [phone]  I have taken an interval history, thoroughly reviewed the chart and examined the patient. I agree with the Advanced Practitioner's note, impression and recommendations, and have recorded additional findings, impressions and recommendations below. I performed a substantive portion of this encounter (>50% time spent), including a  complete performance of the medical decision making.  My additional thoughts are as follows:  Extensive chart review performed on this medically complex patient with many previous inpatient GI consultations and endoscopic evaluations.  Many endoscopic reports and other data points as well as consultation and progress notes were reviewed in preparation for this consult.  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  He has a planned IR procedure to treat the 2 liver lesions within the next week.  Recent hospital admission with another small bowel enteroscopy performed that treated at least one bleeding proximal small bowel AVM.  Video capsule study under endoscopic delivery was not successful, as the capsule traveled from the duodenal bulb back to the stomach and was retained there for  the duration of battery life. The clinical suspicion is that this patient most likely has more distal small bowel AVMs beyond the reach of push enteroscopy.  He has recurrent bleeding that is almost certainly small bowel AVM source, and of course unknown if it is from similar area to what was recently found or something more distal. This is on top of his multiple chronic medical conditions.  Our options are quite limited here.  I recommend a small bowel enteroscopy tomorrow after he receives PRBC transfusion and hemodialysis today.  He was agreeable after discussion of procedure and risks.  The benefits and risks of the planned procedure were described in detail with the patient or (when appropriate) their health care proxy.  Risks were outlined as including, but not limited to, bleeding, infection, perforation, adverse medication reaction leading to cardiac or pulmonary decompensation, pancreatitis (if ERCP).  The limitation of incomplete mucosal visualization was also discussed.  No guarantees or warranties were given.  If that enteroscopy is unable to localize any actively bleeding sources, then I would forego another attempt at video capsule endoscopy.  Since he is under the care of of hematology and has a nonoperative hepatocellular carcinoma soon to be treated, perhaps consideration could be given to the use of long-acting (LAR) octreotide in the outpatient setting to hopefully decrease small bowel AVM bleeding and transfusion requirements.   Nelida Meuse III [phone]

## 2021-11-13 NOTE — Assessment & Plan Note (Addendum)
   Providing patient with PPI therapy as noted above

## 2021-11-13 NOTE — Assessment & Plan Note (Signed)
   Patient reports missing his last session of dialysis due to severe neck pain  Patient typically receives hemodialysis Monday Wednesdays and Fridays  We will formally consult nephrology in the morning for resumption of dialysis

## 2021-11-13 NOTE — Procedures (Signed)
I was present at this dialysis session. I have reviewed the session itself and made appropriate changes.   Filed Weights   11/12/21 1913  Weight: 108.9 kg    Recent Labs  Lab 11/13/21 0351  NA 136  K 4.1  CL 96*  CO2 19*  GLUCOSE 95  BUN 67*  CREATININE 12.18*  CALCIUM 8.3*    Recent Labs  Lab 11/07/21 0830 11/12/21 0850 11/13/21 0351 11/13/21 1044  WBC 4.2 4.5 4.0  --   NEUTROABS 2.5  --   --   --   HGB 7.8* 7.2* 6.7* 7.6*  HCT 22.8* 21.8* 19.4* 22.8*  MCV 98.7 100.5* 98.5  --   PLT 105* 109* 108*  --     Scheduled Meds:  amLODipine  5 mg Oral Daily   atorvastatin  40 mg Oral Daily   Chlorhexidine Gluconate Cloth  6 each Topical Q0600   gabapentin  300 mg Oral QHS   pantoprazole  40 mg Oral BID   Continuous Infusions: PRN Meds:.acetaminophen **OR** acetaminophen, alteplase, heparin, hydrALAZINE, lidocaine (PF), lidocaine-prilocaine, methocarbamol, oxyCODONE, pentafluoroprop-tetrafluoroeth, polyethylene glycol, traZODone   Santiago Bumpers,  MD 11/13/2021, 2:24 PM

## 2021-11-13 NOTE — Assessment & Plan Note (Signed)
.   Continuing home regimen of lipid lowering therapy.  

## 2021-11-13 NOTE — Assessment & Plan Note (Signed)
.   Resume patients home regimen of oral antihypertensives . Titrate antihypertensive regimen as necessary to achieve adequate BP control . PRN intravenous antihypertensives for excessively elevated blood pressure   

## 2021-11-13 NOTE — Assessment & Plan Note (Signed)
   Outpatient follow-up with Dr. Valeta Harms with pulmonology

## 2021-11-13 NOTE — Assessment & Plan Note (Signed)
   Known history of prolonged QT  Avoiding QT prolonging agents  Monitoring on telemetry  Monitoring and replacing electrolytes as necessary

## 2021-11-13 NOTE — Progress Notes (Signed)
Pt receives out-pt HD at [facility] on MWF. Pt arrives at 10:55 for 11:15 chair time. Will assist as needed.   Melven Sartorius Renal Navigator [phone]

## 2021-11-13 NOTE — Assessment & Plan Note (Signed)
   Outpatient follow-up concerning this with gastroenterology.

## 2021-11-13 NOTE — Progress Notes (Signed)
61 year old black male known chronic hep C HCC CA follows with 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 ESRD MWF, HTN, Left BKA Peripheral arterial disease status post R SFA stent 07/08/2021 now off both aspirin and Plavix Recent evaluation 6/10-6/15/2023 with melena found to have albumin and underwent small bowel enteroscopy with friable gastric polyps status post [facility] in the [facility] endoscopy was inconclusive and patient was discharged on p.o. Protonix--40 twice daily-low-dose aspirin discontinued  Came to ED 7/17 with several episodes of dark stool without abdominal pain neck pain however Hemoglobin down from 8.9-->7.2 Patient started on Protonix gtt. and GI was consulted  Subjective Tells me he has not had any aspirin for a while-also has not had a stool since yesterday No chest pain When he does have the dark stools they are mixed in consistency He has no abdominal pain nausea vomiting  O/e BP 119/84   Pulse 77   Temp 98 F (36.7 C)   Resp 16   Ht '5\' 11"'$  (1.803 m)   Wt 108.9 kg   SpO2 99%   BMI 33.47 kg/m  Awake coherent eating crackers-drinking some water in addition-abdomen to stay n.p.o. No icterus no pallor CTA B no added sound rales rhonchi wheeze Abdomen is obese nontender no rebound no guarding Left BKA noted  Plan Keep n.p.o. await GI input likely will need repeat scope although may consider managing conservatively Nephrology planning dialysis later today Check labs in the morning-he is hemodynamically stable at this time

## 2021-11-13 NOTE — Progress Notes (Signed)
Defer to the patient's PCP

## 2021-11-13 NOTE — Procedures (Signed)
HD Note  Patient competed dialysis as ordered.  Patient is alert and oriented.  Had complaints of back pain and itching during the treatment. Tylenol was given for the back pain with no further complaints. Benedryl was given for itching and still felt itching with all the surfaces that were touching the stretcher. There was a sheet on the stretcher. UF [id number]

## 2021-11-13 NOTE — Consult Note (Signed)
[facility] Renal Consultation Note    Indication for Consultation:  Management of ESRD/hemodialysis; anemia, hypertension/volume and secondary hyperparathyroidism  DTO:IZTIWPY, Milford Cage, NP  HPI: Daniel [name] is a 61 y.o. male with ESRD on HD MWF at [facility]. His past medical history is significant for hepatitis C cirrhosis, hepatocellular carcinoma (Dx'ed 07/2021 follows with [name], S/P IR radioembolization 11/07/2021), recurrent GI bleeds (due to portal hypertensive gastropathy, duodenal AVM, gastric and duodenal ulcers in the past), end-stage renal disease (HD MWF), hypertension, intracranial hemorrhage 2019, peripheral vascular disease-s/p left BKA.  Patient presents to the ED c/o neck pain and dark stools. Noted previous hospitalization 10/05/21-10/10/21 for acute blood loss anemia. During this time, he received 1 unit PRBCs. GI was consulted and patient underwent small bowel endoscopy which showed gastric polyps and blood in duodenal bulb. Patient treated with PAC and discharged on PO protonix and advised to hold [facility] and ASA.   Seen and examined patient at bedside. Seen sitting up in bed. He is on RA and denies SOB, CP, ABD pain, and N/V. Noted Hgb this AM 6.7 and 1 unit PRBCs was already given. Noted hemoccult stool is (+). GI consulted. His last HD outpatient was on 11/08/21. Labs include: SrCr 12.18, BUN 67, K+ 4.1, Ca 8.3, Albumin 2.4, and Hgb 6.7. Plan for HD this afternoon.  Past Medical History:  Diagnosis Date   Anemia    Diabetes mellitus without complication [facility])    patient denies   Dialysis patient [facility])    End stage chronic kidney disease ([place])    Hypertension    ICH (intracerebral hemorrhage) ([place]) 05/20/2017   PAD (peripheral artery disease) ([facility])    Shoulder pain, left 06/28/2013   Past Surgical History:  Procedure Laterality Date   A/V FISTULAGRAM N/A 08/15/2020   Procedure: A/V FISTULAGRAM - Left Upper;  Surgeon: Cherre Robins,  MD;  Location: [place] CV LAB;  Service: Cardiovascular;  Laterality: N/A;   ABDOMINAL AORTOGRAM W/LOWER EXTREMITY N/A 04/08/2021   Procedure: ABDOMINAL AORTOGRAM W/LOWER EXTREMITY;  Surgeon: Waynetta Sandy, MD;  Location: [place] CV LAB;  Service: Cardiovascular;  Laterality: N/A;   AMPUTATION Left 04/16/2021   Procedure: LEFT BELOW KNEE AMPUTATION;  Surgeon: Angelia Mould, MD;  Location: [place];  Service: Vascular;  Laterality: Left;   AMPUTATION Left 06/17/2021   Procedure: REVISION AMPUTATION BELOW KNEE;  Surgeon: Broadus John, MD;  Location: [place];  Service: Vascular;  Laterality: Left;   APPENDECTOMY     APPLICATION OF WOUND VAC Left 06/17/2021   Procedure: APPLICATION OF WOUND VAC;  Surgeon: Broadus John, MD;  Location: [place];  Service: Vascular;  Laterality: Left;   AV FISTULA PLACEMENT Left 08/03/2019   Procedure: LEFT ARM ARTERIOVENOUS (AV) CEPHALIC  FISTULA CREATION;  Surgeon: Waynetta Sandy, MD;  Location: [place];  Service: Vascular;  Laterality: Left;   BIOPSY  06/30/2019   Procedure: BIOPSY;  Surgeon: Ronald Lobo, MD;  Location: [facility] ENDOSCOPY;  Service: Endoscopy;;   BIOPSY  08/02/2019   Procedure: BIOPSY;  Surgeon: Yetta Flock, MD;  Location: [facility] ENDOSCOPY;  Service: Gastroenterology;;   BIOPSY  02/08/2021   Procedure: BIOPSY;  Surgeon: Sharyn Creamer, MD;  Location: [facility] ENDOSCOPY;  Service: Gastroenterology;;   BIOPSY  02/24/2021   Procedure: BIOPSY;  Surgeon: [name], MD;  Location: [facility] ENDOSCOPY;  Service: Gastroenterology;;   BIOPSY  03/26/2021   Procedure: BIOPSY;  Surgeon: Lavena Bullion, DO;  Location:  [facility] ENDOSCOPY;  Service: Gastroenterology;;   BIOPSY  08/06/2021   Procedure: BIOPSY;  Surgeon: [name], MD;  Location: [place];  Service: Gastroenterology;;   BIOPSY  09/15/2021   Procedure: BIOPSY;  Surgeon: Sharyn Creamer, MD;  Location: [place];  Service: Gastroenterology;;   COLONOSCOPY   01/23/2012   Procedure: COLONOSCOPY;  Surgeon: Danie Binder, MD;  [facility];  Service: Endoscopy;  Laterality: N/A;  11:10 AM   COLONOSCOPY WITH PROPOFOL N/A 06/30/2019   Procedure: COLONOSCOPY WITH PROPOFOL;  Surgeon: Ronald Lobo, MD;  Location: [place];  Service: Endoscopy;  Laterality: N/A;   ENTEROSCOPY N/A 08/02/2019   Procedure: ENTEROSCOPY;  Surgeon: Yetta Flock, MD;  Location: [facility] ENDOSCOPY;  Service: Gastroenterology;  Laterality: N/A;   ENTEROSCOPY N/A 02/24/2021   Procedure: ENTEROSCOPY;  Surgeon: [name], MD;  Location: [facility] ENDOSCOPY;  Service: Gastroenterology;  Laterality: N/A;   ENTEROSCOPY N/A 03/26/2021   Procedure: ENTEROSCOPY;  Surgeon: Lavena Bullion, DO;  Location: [place];  Service: Gastroenterology;  Laterality: N/A;   ENTEROSCOPY N/A 07/05/2021   Procedure: ENTEROSCOPY;  Surgeon: Carol Ada, MD;  Location: [place];  Service: Gastroenterology;  Laterality: N/A;   ENTEROSCOPY N/A 08/06/2021   Procedure: ENTEROSCOPY;  Surgeon: [name], MD;  Location: [place] ENDOSCOPY;  Service: Gastroenterology;  Laterality: N/A;   ENTEROSCOPY N/A 08/24/2021   Procedure: ENTEROSCOPY;  Surgeon: Ladene Artist, MD;  Location: [facility] ENDOSCOPY;  Service: Gastroenterology;  Laterality: N/A;   ENTEROSCOPY N/A 09/15/2021   Procedure: ENTEROSCOPY;  Surgeon: Sharyn Creamer, MD;  Location: [facility] ENDOSCOPY;  Service: Gastroenterology;  Laterality: N/A;   ENTEROSCOPY N/A 10/06/2021   Procedure: ENTEROSCOPY;  Surgeon: [name], MD;  Location: [facility] ENDOSCOPY;  Service: Gastroenterology;  Laterality: N/A;   ESOPHAGOGASTRODUODENOSCOPY N/A 08/10/2020   Procedure: ESOPHAGOGASTRODUODENOSCOPY (EGD);  Surgeon: Jerene Bears, MD;  Location: [facility] ENDOSCOPY;  Service: Gastroenterology;  Laterality: N/A;   ESOPHAGOGASTRODUODENOSCOPY (EGD) WITH PROPOFOL N/A 06/30/2019   Procedure: ESOPHAGOGASTRODUODENOSCOPY (EGD) WITH PROPOFOL;  Surgeon: Ronald Lobo,  MD;  Location: [place];  Service: Endoscopy;  Laterality: N/A;   ESOPHAGOGASTRODUODENOSCOPY (EGD) WITH PROPOFOL N/A 01/12/2021   Procedure: ESOPHAGOGASTRODUODENOSCOPY (EGD) WITH PROPOFOL;  Surgeon: Sharyn Creamer, MD;  Location: [place];  Service: Gastroenterology;  Laterality: N/A;   ESOPHAGOGASTRODUODENOSCOPY (EGD) WITH PROPOFOL N/A 02/08/2021   Procedure: ESOPHAGOGASTRODUODENOSCOPY (EGD) WITH PROPOFOL;  Surgeon: Sharyn Creamer, MD;  Location: [place];  Service: Gastroenterology;  Laterality: N/A;   FISTULA SUPERFICIALIZATION Left 10/17/2019   Procedure: LEFT UPPER EXTREMITY FISTULA REVISION, SIDE BRANCH LIGATION,  AND SUPERFICIALIZATION;  Surgeon: Marty Heck, MD;  Location: [place];  Service: Vascular;  Laterality: Left;   FISTULA SUPERFICIALIZATION Left 93/23/5573   Procedure: PLICATION OF ANEURYSM LEFT ARTERIOVENOUS FISTULA;  Surgeon: Angelia Mould, MD;  Location: [place];  Service: Vascular;  Laterality: Left;   FLEXIBLE SIGMOIDOSCOPY N/A 07/05/2021   Procedure: FLEXIBLE SIGMOIDOSCOPY;  Surgeon: Carol Ada, MD;  Location: [place];  Service: Gastroenterology;  Laterality: N/A;   GIVENS CAPSULE STUDY N/A 06/30/2019   Procedure: GIVENS CAPSULE STUDY;  Surgeon: Ronald Lobo, MD;  Location: [place];  Service: Endoscopy;  Laterality: N/A;   GIVENS CAPSULE STUDY N/A 08/25/2021   Procedure: GIVENS CAPSULE STUDY;  Surgeon: Ladene Artist, MD;  Location: [place];  Service: Gastroenterology;  Laterality: N/A;   GIVENS CAPSULE STUDY N/A 10/06/2021   Procedure: GIVENS CAPSULE STUDY;  Surgeon: [name], MD;  Location: [place];  Service:  Gastroenterology;  Laterality: N/A;   HEMOSTASIS CLIP PLACEMENT  08/10/2020   Procedure: HEMOSTASIS CLIP PLACEMENT;  Surgeon: Jerene Bears, MD;  Location: [place];  Service: Gastroenterology;;   HEMOSTASIS CLIP PLACEMENT  01/12/2021   Procedure: HEMOSTASIS CLIP PLACEMENT;  Surgeon: Sharyn Creamer, MD;   Location: [place];  Service: Gastroenterology;;   HEMOSTASIS CONTROL  08/02/2019   Procedure: HEMOSTASIS CONTROL;  Surgeon: Yetta Flock, MD;  Location: [place];  Service: Gastroenterology;;   HOT HEMOSTASIS  02/24/2021   Procedure: HOT HEMOSTASIS (ARGON PLASMA COAGULATION/BICAP);  Surgeon: [name], MD;  Location: [facility] ENDOSCOPY;  Service: Gastroenterology;;   HOT HEMOSTASIS N/A 03/26/2021   Procedure: HOT HEMOSTASIS (ARGON PLASMA COAGULATION/BICAP);  Surgeon: Lavena Bullion, DO;  Location: [facility] ENDOSCOPY;  Service: Gastroenterology;  Laterality: N/A;   HOT HEMOSTASIS N/A 07/05/2021   Procedure: HOT HEMOSTASIS (ARGON PLASMA COAGULATION/BICAP);  Surgeon: Carol Ada, MD;  Location: [place];  Service: Gastroenterology;  Laterality: N/A;   HOT HEMOSTASIS N/A 09/15/2021   Procedure: HOT HEMOSTASIS (ARGON PLASMA COAGULATION/BICAP);  Surgeon: Sharyn Creamer, MD;  Location: [place];  Service: Gastroenterology;  Laterality: N/A;   HOT HEMOSTASIS N/A 10/06/2021   Procedure: HOT HEMOSTASIS (ARGON PLASMA COAGULATION/BICAP);  Surgeon: [name], MD;  Location: [place];  Service: Gastroenterology;  Laterality: N/A;   INCISION AND DRAINAGE ABSCESS N/A 06/29/2016   Procedure: INCISION AND DRAINAGE ABDOMINAL WALL ABSCESS;  Surgeon: Alphonsa Overall, MD;  Location: [facility];  Service: General;  Laterality: N/A;   INSERTION OF DIALYSIS CATHETER Right 08/03/2019   Procedure: INSERTION OF DIALYSIS CATHETER;  Surgeon: Waynetta Sandy, MD;  Location: [place];  Service: Vascular;  Laterality: Right;   INSERTION OF DIALYSIS CATHETER Right 10/22/2019   Procedure: INSERTION OF 23CM TUNNELED DIALYSIS CATHETER RIGHT INTERNAL JUGULAR;  Surgeon: Angelia Mould, MD;  Location: [place];  Service: Vascular;  Laterality: Right;   INSERTION OF DIALYSIS CATHETER Right 08/12/2020   Procedure: INSERTION OF Right internal Jugular TUNNELED  DIALYSIS CATHETER.;  Surgeon: Waynetta Sandy, MD;  Location: [place];  Service: Vascular;  Laterality: Right;   INSERTION OF DIALYSIS CATHETER N/A 04/19/2021   Procedure: INSERTION OF TUNNELED DIALYSIS CATHETER;  Surgeon: Angelia Mould, MD;  Location: [place];  Service: Vascular;  Laterality: N/A;   IR 3D INDEPENDENT WKST  11/07/2021   IR ANGIOGRAM SELECTIVE EACH ADDITIONAL VESSEL  11/07/2021   IR ANGIOGRAM SELECTIVE EACH ADDITIONAL VESSEL  11/07/2021   IR ANGIOGRAM SELECTIVE EACH ADDITIONAL VESSEL  11/07/2021   IR ANGIOGRAM VISCERAL SELECTIVE  11/07/2021   IR EMBO ARTERIAL NOT HEMORR HEMANG INC GUIDE ROADMAPPING  11/07/2021   IR RADIOLOGIST EVAL & MGMT  09/12/2021   IR US GUIDE VASC ACCESS LEFT  11/07/2021   Left heel surgery     LOWER EXTREMITY ANGIOGRAPHY N/A 07/08/2021   Procedure: Lower Extremity Angiography;  Surgeon: Cherre Robins, MD;  Location: [place] CV LAB;  Service: Cardiovascular;  Laterality: N/A;   PENILE BIOPSY N/A 03/26/2020   Procedure: PENILE ULCER DEBRIDEMENT;  Surgeon: Remi Haggard, MD;  Location: [facility];  Service: Urology;  Laterality: N/A;  30 MINS   PERIPHERAL VASCULAR INTERVENTION Right 07/08/2021   Procedure: PERIPHERAL VASCULAR INTERVENTION;  Surgeon: Cherre Robins, MD;  Location: [place] CV LAB;  Service: Cardiovascular;  Laterality: Right;   SCLEROTHERAPY  01/12/2021   Procedure: Clide Deutscher;  Surgeon: Sharyn Creamer, MD;  Location: [facility] ENDOSCOPY;  Service: Gastroenterology;;   Clide Deutscher  02/24/2021  Procedure: SCLEROTHERAPY;  Surgeon: [name], MD;  Location: [place];  Service: Gastroenterology;;   SUBMUCOSAL TATTOO INJECTION  08/24/2021   Procedure: SUBMUCOSAL TATTOO INJECTION;  Surgeon: Ladene Artist, MD;  Location: [place];  Service: Gastroenterology;;   SUBMUCOSAL TATTOO INJECTION  09/15/2021   Procedure: SUBMUCOSAL TATTOO INJECTION;  Surgeon: Sharyn Creamer, MD;  Location: [facility] ENDOSCOPY;  Service: Gastroenterology;;   Family History   Problem Relation Age of Onset   Colon cancer Neg Hx    Social History:  reports that he has been smoking cigarettes. He has a 22.50 pack-year smoking history. He has never used smokeless tobacco. He reports that he does not currently use alcohol. He reports that he does not currently use drugs after having used the following drugs: "Crack" cocaine. Allergies  Allergen Reactions   Seroquel [Quetiapine] Other (See Comments)    Tardive kinesia/dystonia   Dilaudid [Hydromorphone Hcl] Itching and Other (See Comments)    Pt reports itchiness after IM injection    Prior to Admission medications   Medication Sig Start Date End Date Taking? Authorizing Provider  amLODipine (NORVASC) 5 MG tablet Take 1 tablet (5 mg total) by mouth daily. 09/18/21 12/17/21  [place] Donnamarie Poag, DO  atorvastatin (LIPITOR) 40 MG tablet Take 1 tablet (40 mg total) by mouth daily. 09/18/21 12/17/21  [place] Donnamarie Poag, DO  gabapentin (NEURONTIN) 300 MG capsule Take 1 capsule (300 mg total) by mouth 3 (three) times daily. 11/07/21   [name], NP  loperamide (IMODIUM A-D) 2 MG tablet Take 1 tablet (2 mg total) by mouth 4 (four) times daily as needed for diarrhea or loose stools. 10/22/21   [name], NP  pantoprazole (PROTONIX) 40 MG tablet Take 1 tablet (40 mg total) by mouth 2 (two) times daily before a meal. 10/10/21 01/08/22  Mercy Riding, MD  traZODone (DESYREL) 50 MG tablet Take 1 tablet (50 mg total) by mouth at bedtime as needed for sleep. 10/22/21   [name], NP  colchicine 0.6 MG tablet Take 0.5 tablets (0.3 mg total) by mouth 2 (two) times daily. 07/24/20 07/29/20  Noemi Chapel, MD  furosemide (LASIX) 40 MG tablet Take 1 tablet (40 mg total) by mouth daily. 07/25/19 02/09/20  Ladona Horns, MD   Current Facility-Administered Medications  Medication Dose Route Frequency Provider Last Rate Last Admin   acetaminophen (TYLENOL) tablet 650 mg  650 mg Oral Q6H PRN Vernelle Emerald, MD   650 mg at  11/13/21 0500   Or   acetaminophen (TYLENOL) suppository 650 mg  650 mg Rectal Q6H PRN Shalhoub, Sherryll Burger, MD       alteplase (CATHFLO ACTIVASE) injection 2 mg  2 mg Intracatheter Once PRN Adelfa Koh, NP       amLODipine (NORVASC) tablet 5 mg  5 mg Oral Daily Shalhoub, Sherryll Burger, MD       atorvastatin (LIPITOR) tablet 40 mg  40 mg Oral Daily Shalhoub, Sherryll Burger, MD       Chlorhexidine Gluconate Cloth 2 % PADS 6 each  6 each Topical Q0600 Adelfa Koh, NP       gabapentin (NEURONTIN) capsule 300 mg  300 mg Oral QHS Shalhoub, Sherryll Burger, MD       heparin injection 1,000 Units  1,000 Units Dialysis PRN Adelfa Koh, NP       hydrALAZINE (APRESOLINE) injection 10 mg  10 mg Intravenous Q6H PRN Shalhoub, Sherryll Burger, MD  lidocaine (PF) (XYLOCAINE) 1 % injection 5 mL  5 mL Intradermal PRN Adelfa Koh, NP       lidocaine-prilocaine (EMLA) cream 1 Application  1 Application Topical PRN Adelfa Koh, NP       methocarbamol (ROBAXIN) tablet 500 mg  500 mg Oral Q6H PRN Vernelle Emerald, MD   500 mg at 11/13/21 0244   oxyCODONE (Oxy IR/ROXICODONE) immediate release tablet 5 mg  5 mg Oral Q6H PRN Vernelle Emerald, MD   5 mg at 11/13/21 0244   pantoprazole (PROTONIX) injection 40 mg  40 mg Intravenous Q12H Vernelle Emerald, MD   40 mg at 11/12/21 2229   pentafluoroprop-tetrafluoroeth (GEBAUERS) aerosol 1 Application  1 Application Topical PRN Adelfa Koh, NP       polyethylene glycol (MIRALAX / GLYCOLAX) packet 17 g  17 g Oral Daily PRN Shalhoub, Sherryll Burger, MD       traZODone (DESYREL) tablet 50 mg  50 mg Oral QHS PRN Shalhoub, Sherryll Burger, MD       Current Outpatient Medications  Medication Sig Dispense Refill   amLODipine (NORVASC) 5 MG tablet Take 1 tablet (5 mg total) by mouth daily. 30 tablet 2   atorvastatin (LIPITOR) 40 MG tablet Take 1 tablet (40 mg total) by mouth daily. 30 tablet 2   gabapentin (NEURONTIN) 300 MG capsule Take 1 capsule (300 mg  total) by mouth 3 (three) times daily. 90 capsule 3   loperamide (IMODIUM A-D) 2 MG tablet Take 1 tablet (2 mg total) by mouth 4 (four) times daily as needed for diarrhea or loose stools. 30 tablet 0   pantoprazole (PROTONIX) 40 MG tablet Take 1 tablet (40 mg total) by mouth 2 (two) times daily before a meal. 60 tablet 2   traZODone (DESYREL) 50 MG tablet Take 1 tablet (50 mg total) by mouth at bedtime as needed for sleep. 90 tablet 0   Labs: Basic Metabolic Panel: Recent Labs  Lab 11/07/21 0830 11/12/21 0850 11/13/21 0351  NA 141 136 136  K 3.8 4.2 4.1  CL 98 93* 96*  CO2 25 17* 19*  GLUCOSE 89 87 95  BUN 41* 59* 67*  CREATININE 6.94* 10.25* 12.18*  CALCIUM 8.5* 8.5* 8.3*   Liver Function Tests: Recent Labs  Lab 11/07/21 0830 11/12/21 0850 11/13/21 0351  AST 42* 47* 44*  ALT '17 17 16  '$ ALKPHOS 132* 141* 122  BILITOT 0.9 1.2 0.7  PROT 8.6* 8.2* 8.1  ALBUMIN 2.8* 2.5* 2.4*   No results for input(s): "LIPASE", "AMYLASE" in the last 168 hours. No results for input(s): "AMMONIA" in the last 168 hours. CBC: Recent Labs  Lab 11/07/21 0830 11/12/21 0850 11/13/21 0351  WBC 4.2 4.5 4.0  NEUTROABS 2.5  --   --   HGB 7.8* 7.2* 6.7*  HCT 22.8* 21.8* 19.4*  MCV 98.7 100.5* 98.5  PLT 105* 109* 108*   Cardiac Enzymes: No results for input(s): "CKTOTAL", "CKMB", "CKMBINDEX", "TROPONINI" in the last 168 hours. CBG: Recent Labs  Lab 11/07/21 0801  GLUCAP 88   Iron Studies: No results for input(s): "IRON", "TIBC", "TRANSFERRIN", "FERRITIN" in the last 72 hours. Studies/Results: No results found.  Physical Exam: Vitals:   11/13/21 0430 11/13/21 0502 11/13/21 0535 11/13/21 0757  BP:  (!) 150/79 (!) 180/88 119/84  Pulse: 85 83 82 77  Resp: 20 (!) '22 19 16  '$ Temp:  97.9 F (36.6 C) 97.9 F (36.6 C) 98 F (36.7 C)  TempSrc:  Oral Oral   SpO2: 95% 99% 100% 99%  Weight:      Height:         General: Chronically ill-appearing; on RA; NAD Head: sclera anicteric Lungs:  CTA bilaterally. No wheeze, rales or rhonchi. Breathing is unlabored. Heart: RRR. No murmur, rubs or gallops.  Abdomen: soft, nontender, + bowel sounds Lower extremities: L BKA-no edema at stump; no edema RLE Neuro: AAOx3. Moves all extremities spontaneously. Dialysis Access: L AVF (+) B/T  Dialysis Orders:  MWF- [place] 4hrs, BFR 450, DFR 500,  EDW 101kg, 2K/ 2Ca NO SYSTEMIC HEPARIN No ESA d/t active CA  Assessment/Plan: Acute blood loss anemia: Hx recurrent GIBs, duodenal AVMs, and duodenal ulcers. Current Hemoccult (+). Hgb 6.7-s/p 1 unit PRBCs, will re-check CBC with HD today. Reviewed hospitalist note-may need repeat scope. GI consulted. ESRD: on HD MWF. Already missed 1 treatment. K+ stable. Plan for HD today. Hypertension/volume: Patient missed 1 HD treatment this week and now up 8kg. He's NAD and on RA. Blood pressures are stable. Continue Amlodipine. HD today Anemia of CKD: See above. No ESA d/t active CA and No heparin with HD 5.   Secondary Hyperparathyroidism: Corr Ca okay, will check PO4 in AM 6.    Nutrition: NPO, advance to renal diet with fluid restriction when clinically stable  Tobie Poet, NP [employer] 11/13/2021, 9:35 AM

## 2021-11-14 ENCOUNTER — Inpatient Hospital Stay (HOSPITAL_COMMUNITY): Payer: Medicare Other | Admitting: Anesthesiology

## 2021-11-14 ENCOUNTER — Inpatient Hospital Stay: Payer: Medicare Other

## 2021-11-14 ENCOUNTER — Encounter (HOSPITAL_COMMUNITY): Payer: Self-pay | Admitting: Internal Medicine

## 2021-11-14 ENCOUNTER — Encounter (HOSPITAL_COMMUNITY)
Admission: EM | Disposition: A | Discharge status: Home / Self Care | Payer: Self-pay | Source: Home / Self Care | Attending: Family Medicine

## 2021-11-14 DIAGNOSIS — I120 Hypertensive chronic kidney disease with stage 5 chronic kidney disease or end stage renal disease: Secondary | ICD-10-CM

## 2021-11-14 DIAGNOSIS — K3189 Other diseases of stomach and duodenum: Secondary | ICD-10-CM | POA: Diagnosis not present

## 2021-11-14 DIAGNOSIS — D62 Acute posthemorrhagic anemia: Secondary | ICD-10-CM | POA: Diagnosis not present

## 2021-11-14 DIAGNOSIS — K921 Melena: Secondary | ICD-10-CM | POA: Diagnosis not present

## 2021-11-14 DIAGNOSIS — Z992 Dependence on renal dialysis: Secondary | ICD-10-CM

## 2021-11-14 DIAGNOSIS — I8501 Esophageal varices with bleeding: Secondary | ICD-10-CM

## 2021-11-14 DIAGNOSIS — K31819 Angiodysplasia of stomach and duodenum without bleeding: Secondary | ICD-10-CM

## 2021-11-14 DIAGNOSIS — N186 End stage renal disease: Secondary | ICD-10-CM

## 2021-11-14 DIAGNOSIS — F17210 Nicotine dependence, cigarettes, uncomplicated: Secondary | ICD-10-CM

## 2021-11-14 HISTORY — PX: ENTEROSCOPY: SHX5533

## 2021-11-14 LAB — CBC
HCT: 23.3 % — ABNORMAL LOW (ref 39.0–52.0)
HCT: 22 % — ABNORMAL LOW (ref 39.0–52.0)
Hemoglobin: 7.9 g/dL — ABNORMAL LOW (ref 13.0–17.0)
Hemoglobin: 7.6 g/dL — ABNORMAL LOW (ref 13.0–17.0)
MCH: 32.1 pg (ref 26.0–34.0)
MCH: 32.9 pg (ref 26.0–34.0)
MCHC: 33.9 g/dL (ref 30.0–36.0)
MCHC: 34.5 g/dL (ref 30.0–36.0)
MCV: 94.7 fL (ref 80.0–100.0)
MCV: 95.2 fL (ref 80.0–100.0)
Platelets: 114 10*3/uL — ABNORMAL LOW (ref 150–400)
Platelets: 111 10*3/uL — ABNORMAL LOW (ref 150–400)
RBC: 2.46 MIL/uL — ABNORMAL LOW (ref 4.22–5.81)
RBC: 2.31 MIL/uL — ABNORMAL LOW (ref 4.22–5.81)
RDW: 18.2 % — ABNORMAL HIGH (ref 11.5–15.5)
RDW: 18 % — ABNORMAL HIGH (ref 11.5–15.5)
WBC: 3.8 10*3/uL — ABNORMAL LOW (ref 4.0–10.5)
WBC: 3.4 10*3/uL — ABNORMAL LOW (ref 4.0–10.5)
nRBC: 0 % (ref 0.0–0.2)
nRBC: 0 % (ref 0.0–0.2)

## 2021-11-14 LAB — TYPE AND SCREEN
ABO/RH(D): B POS
Antibody Screen: NEGATIVE
Unit division: 0

## 2021-11-14 LAB — BPAM RBC
Blood Product Expiration Date: 202308082359
ISSUE DATE / TIME: 202307190515
Unit Type and Rh: 7300

## 2021-11-14 LAB — HEPATITIS B SURFACE ANTIBODY, QUANTITATIVE: Hep B S AB Quant (Post): 37.7 m[IU]/mL (ref >9.9)

## 2021-11-14 SURGERY — ENTEROSCOPY
Anesthesia: Monitor Anesthesia Care

## 2021-11-14 MED ORDER — SODIUM CHLORIDE 0.9 % IV SOLN: INTRAVENOUS | Status: DC

## 2021-11-14 MED ORDER — LIDOCAINE 2% (20 MG/ML) 5 ML SYRINGE
INTRAMUSCULAR | Status: DC | PRN
  Administered 2021-11-14: 100 mg via INTRAVENOUS

## 2021-11-14 MED ORDER — DIPHENHYDRAMINE HCL 50 MG/ML IJ SOLN
25.0000 mg | Freq: Once | INTRAMUSCULAR | Status: DC
Start: 2021-11-15 — End: 2021-11-15

## 2021-11-14 MED ORDER — PENTAFLUOROPROP-TETRAFLUOROETH EX AERO: 1.0000 | INHALATION_SPRAY | CUTANEOUS | Status: DC | PRN

## 2021-11-14 MED ORDER — ALTEPLASE 2 MG IJ SOLR: 2.0000 mg | Freq: Once | INTRAMUSCULAR | Status: DC | PRN

## 2021-11-14 MED ORDER — LIDOCAINE HCL (PF) 1 % IJ SOLN: 5.0000 mL | INTRAMUSCULAR | Status: DC | PRN

## 2021-11-14 MED ORDER — PROPOFOL 10 MG/ML IV BOLUS
INTRAVENOUS | Status: DC | PRN
  Administered 2021-11-14: 50 mg via INTRAVENOUS
  Administered 2021-11-14: 10 mg via INTRAVENOUS
  Administered 2021-11-14 (×2): 20 mg via INTRAVENOUS
  Administered 2021-11-14: 30 mg via INTRAVENOUS

## 2021-11-14 MED ORDER — LIDOCAINE-PRILOCAINE 2.5-2.5 % EX CREA: 1.0000 | TOPICAL_CREAM | CUTANEOUS | Status: DC | PRN

## 2021-11-14 MED ORDER — LACTATED RINGERS IV SOLN: INTRAVENOUS | Status: DC | PRN

## 2021-11-14 MED ORDER — HEPARIN SODIUM (PORCINE) 1000 UNIT/ML DIALYSIS: 1000.0000 [IU] | INTRAMUSCULAR | Status: DC | PRN

## 2021-11-14 MED ORDER — PROPOFOL 500 MG/50ML IV EMUL
INTRAVENOUS | Status: DC | PRN
  Administered 2021-11-14: 100 ug/kg/min via INTRAVENOUS

## 2021-11-14 NOTE — Anesthesia Postprocedure Evaluation (Signed)
Anesthesia Post Note  Patient: Daniel [name]  Procedure(s) Performed: ENTEROSCOPY     Patient location during evaluation: Endoscopy Anesthesia Type: MAC Level of consciousness: awake and alert Pain management: pain level controlled Vital Signs Assessment: post-procedure vital signs reviewed and stable Respiratory status: spontaneous breathing, nonlabored ventilation, respiratory function stable and patient connected to nasal cannula oxygen Cardiovascular status: blood pressure returned to baseline and stable Postop Assessment: no apparent nausea or vomiting Anesthetic complications: no   No notable events documented.  Last Vitals:  Vitals:   11/14/21 1340 11/14/21 1350  BP: 125/79 (!) 144/82  Pulse: 88 87  Resp: 17 16  Temp:    SpO2: 97% 95%    Last Pain:  Vitals:   11/14/21 1350  TempSrc:   PainSc: 0-No pain                 Yu Cragun DANIEL

## 2021-11-14 NOTE — Op Note (Signed)
[facility] Patient Name: Daniel [name] Procedure Date : 11/14/2021 MRN: [id number] Attending MD: Estill Cotta. Loletha Carrow , MD Date of Birth: [birth year] CSN: [id number] Age: 61 Admit Type: Inpatient Procedure:                Small bowel enteroscopy Indications:              Melena, Arteriovenous malformation in the small                            intestine                           SBE last month with APC of bleeding lesion(s) Providers:                Estill Cotta. Loletha Carrow, MD, Vista Lawman, RN, Cletis Athens,                            Technician Referring MD:             [facility] Medicines:                Monitored Anesthesia Care Complications:            No immediate complications. Estimated Blood Loss:     Estimated blood loss: none. Procedure:                Pre-Anesthesia Assessment:                           - Prior to the procedure, a History and Physical                            was performed, and patient medications and                            allergies were reviewed. The patient's tolerance of                            previous anesthesia was also reviewed. The risks                            and benefits of the procedure and the sedation                            options and risks were discussed with the patient.                            All questions were answered, and informed consent                            was obtained. Prior Anticoagulants: The patient has                            taken no previous anticoagulant or antiplatelet                            agents.  ASA Grade Assessment: IV - A patient with                            severe systemic disease that is a constant threat                            to life. After reviewing the risks and benefits,                            the patient was deemed in satisfactory condition to                            undergo the procedure.                           After obtaining informed consent,  the endoscope was                            passed under direct vision. Throughout the                            procedure, the patient's blood pressure, pulse, and                            oxygen saturations were monitored continuously. The                            PCF-190TL ([id number]) Olympus colonoscope was                            introduced through the mouth and advanced to the                            proximal jejunum. The patient tolerated the                            procedure well. Scope In: Scope Out: Findings:      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      Diffuse mildly congested mucosa (cobblestoning) was found in the entire       examined stomach.      Nodular mucosa was found in the duodenal bulb.      The exam of the duodenum was otherwise normal.      A tattoo was seen at 110 cm (from the incisors). The tattoo site       appeared normal. The scope was advanced about 5cm distal to the tattoo.      No fresh or blood seen, no bleeding sources identified. Impression:               - Grade I esophageal varices.                           - Congestive gastropathy.                           -  Nodular mucosa in the duodenal bulb.                           - A tattoo was seen in the jejunum. The tattoo site                            appeared normal.                           - No specimens collected. Recommendation:           - Resume regular diet.                           - See 11/13/21 GI consult note.                           This patient is suspected to have more distal small                            bowel AVMs beyond the reach of push enteroscopy.                           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 Consider use of LER                            (long-acting) octreotide in the outpatient setting.                            Close monitoring of Hgb/Hct abd  administer IV iron                            and PRBCs as needed.                           Inpatient GI service signing off - call as needed. Procedure Code(s):        --- Professional ---                           [phone], Small intestinal endoscopy, enteroscopy                            beyond second portion of duodenum, not including                            ileum; diagnostic, including collection of                            specimen(s) by brushing or washing, when performed                            (separate procedure) Diagnosis Code(s):        ---  Professional ---                           K31.89, Other diseases of stomach and duodenum                           K92.1, Melena (includes Hematochezia)                           K31.819, Angiodysplasia of stomach and duodenum                            without bleeding CPT copyright 2019 [employer]. All rights reserved. The codes documented in this report are preliminary and upon coder review may  be revised to meet current compliance requirements. Cherith Tewell L. Loletha Carrow, MD 11/14/2021 1:45:17 PM This report has been signed electronically. Number of Addenda: 0

## 2021-11-14 NOTE — Transfer of Care (Signed)
Immediate Anesthesia Transfer of Care Note  Patient: Daniel [name]  Procedure(s) Performed: ENTEROSCOPY  Patient Location: Endoscopy Unit  Anesthesia Type:MAC  Level of Consciousness: drowsy  Airway & Oxygen Therapy: Patient Spontanous Breathing and Patient connected to nasal cannula oxygen  Post-op Assessment: Report given to RN and Post -op Vital signs reviewed and stable  Post vital signs: Reviewed and stable  Last Vitals:  Vitals Value Taken Time  BP 129/81 11/14/21 1335  Temp 37.1 C 11/14/21 1335  Pulse 87 11/14/21 1338  Resp 16 11/14/21 1338  SpO2 97 % 11/14/21 1338  Vitals shown include unvalidated device data.  Last Pain:  Vitals:   11/14/21 1335  TempSrc: Temporal  PainSc: 0-No pain         Complications: No notable events documented.

## 2021-11-14 NOTE — Interval H&P Note (Signed)
History and Physical Interval Note:  11/14/2021 12:56 PM  Daniel [name]  has presented today for surgery, with the diagnosis of Recurrent GI bleed, anemia, history AVMs..  The various methods of treatment have been discussed with the patient and family. After consideration of risks, benefits and other options for treatment, the patient has consented to  Procedure(s): ENTEROSCOPY (N/A) as a surgical intervention.  The patient's history has been reviewed, patient examined, no change in status, stable for surgery.  I have reviewed the patient's chart and labs.  Questions were answered to the patient's satisfaction.    He had HD last evening and Hgb stable at 7.9 this AM  Nelida Meuse III

## 2021-11-14 NOTE — Progress Notes (Signed)
Patient has a cardiac monitor order, but is refusing to wear it. This RN wrote MD to notify.

## 2021-11-14 NOTE — Progress Notes (Addendum)
[facility] Progress Note   Subjective:    Seen and examined patient at bedside. Tolerated yesterday's HD with net UF 3.9L. Benadryl X 1 was given yesterday with HD for itchiness. S/p 1 unit PRBC for Hgb 6.7. Hgb now 7.8. Per GI, scheduled for repeat small bowel enteroscopy today.  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; no edema RLE Dialysis Access: L AVF (+) B/T   Filed Weights   11/12/21 1913 11/13/21 1820  Weight: 108.9 kg 105.9 kg    Intake/Output Summary (Last 24 hours) at 11/14/2021 0924 Last data filed at 11/13/2021 1724 Gross per 24 hour  Intake --  Output 3900 ml  Net -3900 ml    Additional Objective Labs: Basic Metabolic Panel: Recent Labs  Lab 11/12/21 0850 11/13/21 0351 11/14/21 0407  NA 136 136 134*  K 4.2 4.1 4.0  CL 93* 96* 92*  CO2 17* 19* 26  GLUCOSE 87 95 85  BUN 59* 67* 29*  CREATININE 10.25* 12.18* 6.78*  CALCIUM 8.5* 8.3* 8.6*   Liver Function Tests: Recent Labs  Lab 11/12/21 0850 11/13/21 0351 11/14/21 0407  AST 47* 44* 47*  ALT '17 16 18  '$ ALKPHOS 141* 122 129*  BILITOT 1.2 0.7 1.1  PROT 8.2* 8.1 8.3*  ALBUMIN 2.5* 2.4* 2.5*   No results for input(s): "LIPASE", "AMYLASE" in the last 168 hours. CBC: Recent Labs  Lab 11/12/21 0850 11/13/21 0351 11/13/21 1044 11/13/21 2012 11/14/21 0407  WBC 4.5 4.0  --  3.6* 3.8*  HGB 7.2* 6.7* 7.6* 7.8* 7.9*  HCT 21.8* 19.4* 22.8* 23.0* 23.3*  MCV 100.5* 98.5  --  94.7 94.7  PLT 109* 108*  --  103* 114*    Blood Culture    Component Value Date/Time   SDES BLOOD BLOOD RIGHT HAND 08/16/2021 2049   SPECREQUEST  08/16/2021 2049    BOTTLES DRAWN AEROBIC AND ANAEROBIC Blood Culture adequate volume   CULT  08/16/2021 2049    NO GROWTH 5 DAYS Performed at [facility] [address] [place], [place] [address]    REPTSTATUS 08/21/2021 FINAL 08/16/2021 2049    Cardiac Enzymes: No results for input(s): "CKTOTAL", "CKMB", "CKMBINDEX", "TROPONINI" in the last 168 hours. CBG: No results for input(s): "GLUCAP" in the last 168 hours. Iron Studies: No results for input(s): "IRON", "TIBC", "TRANSFERRIN", "FERRITIN" in the last 72 hours. Lab Results  Component Value Date   INR 1.1 11/13/2021   INR 1.1 11/07/2021   INR 1.0 10/24/2021   Studies/Results: No results found.  Medications:   amLODipine  5 mg Oral Daily   atorvastatin  40 mg Oral Daily   Chlorhexidine Gluconate Cloth  6 each Topical Q0600   gabapentin  300 mg Oral QHS   pantoprazole  40 mg Oral BID    Dialysis Orders: MWF- [facility] 4hrs, BFR 450, DFR 500,  EDW 101kg, 2K/ 2Ca NO SYSTEMIC  HEPARIN No ESA d/t active CA  Assessment/Plan: Acute blood loss anemia: Hx recurrent GIBs, duodenal AVMs, and duodenal ulcers. Current Hemoccult (+). Hgb 6.7-s/p 1 unit PRBCs. Hgb now 7.8. GI consulted: patient scheduled for repeat small bowel enteroscopy today. ESRD: on HD MWF. Off schedule, received HD 7/19, plan for HD 7/21. Hypertension/volume: He missed 1 treatment last week. Getting closer to his EDW. Push UF with HD. Continue Amlodipine. HD today Anemia of CKD: See above. No ESA d/t active CA and No heparin with HD. Hgb is improving-now 7.8. Monitor trend 5.   Secondary Hyperparathyroidism: Corr Ca okay, will check PO4 in AM 6.    Nutrition: NPO, advance to renal diet with fluid restriction when clinically stable  Tobie Poet, NP [facility] 11/14/2021,9:24 AM  LOS: 1 day

## 2021-11-14 NOTE — Anesthesia Preprocedure Evaluation (Addendum)
Anesthesia Evaluation  Patient identified by MRN, date of birth, ID band Patient awake    Reviewed: Allergy & Precautions, NPO status , Patient's Chart, lab work & pertinent test results  History of Anesthesia Complications Negative for: history of anesthetic complications  Airway Mallampati: II  TM Distance: >3 FB Neck ROM: Full    Dental  (+) Dental Advisory Given, Poor Dentition   Pulmonary Current Smoker and Patient abstained from smoking.,    Pulmonary exam normal        Cardiovascular hypertension, + Peripheral Vascular Disease  Normal cardiovascular exam   Polyakov, Ustin C  Procedures:  ECHO COMPLETE WO IMAGING ENHANCING AGENT Height:  _0  (1.803 m) Weight:  269 lb 13.5 oz (122.4 kg) Blood Pressure:  171/75   Accession Number:  [id number] Date of Study:  06/14/21 Ordering Provider:  Lenore Cordia, MD Clinical Indications:  None Listed   Interpreting Physicians  Performing Staff Larey Dresser, MD  Tech:  Jefferey Pica   ECHOCARDIOGRAM COMPLETE (Accession [id number]) (Order [id number]) Echocardiography Date: 06/13/2021 Department: [facility] Released By/Authorizing: Lenore Cordia, MD (auto-released)   ECHOCARDIOGRAM COMPLETE Order #: [id number] Accession #: [id number]    Patient Info  Patient name: Daniel [name]  MRN: [id number]  Age: 61 y.o.  Sex: male    MyChart Results Release  MyChart Status: Pending Results Release      Vitals  BP Height Weight BSA (Calculated - sq m) 138/80 _1  (1.803 m) 118.7 kg 2.43 sq meters    Order-Level Documents:  Scan on 06/14/2021 4:20 PM by Default, Provider, MD     Study Result    ECHOCARDIOGRAM REPORT       Patient Name:  Daniel [name] Date of Exam: 06/14/2021  Medical Rec #: [id number]     Height:    71.0 in  Accession #:  [id number]     Weight:    273.6 lb  Date of Birth:  1960/12/07     BSA:     2.409 m  Patient Age:  61 years      BP:      171/75 mmHg  Patient Gender: M         HR:      87 bpm.  Exam Location: Inpatient   Procedure: 2D Echo   Indications:  Chest pain    History:    Patient has prior history of Echocardiogram examinations,  most         recent 10/28/2020. CHF; Risk Factors:Hypertension.    Sonographer:  Jefferey Pica  Referring Phys: [id number] [place]    1. Left ventricular ejection fraction, by estimation, is 55 to 60%. The  left ventricle has normal function. The left ventricle has no regional  wall motion abnormalities. The left ventricular internal cavity size was  mildly dilated. There is mild left  ventricular hypertrophy. Left ventricular diastolic parameters are  indeterminate.  2. Right ventricular systolic function is normal. The right ventricular  size is normal. There is moderately elevated pulmonary artery systolic  pressure. The estimated right ventricular systolic pressure is 36.4 mmHg.  3. Left atrial size was moderately dilated.  4. Right atrial size was mildly dilated.  5. The mitral valve is normal in structure. Trivial mitral valve  regurgitation. No evidence of mitral stenosis.  6. The aortic valve is tricuspid. Aortic valve regurgitation is not  visualized. Aortic valve sclerosis/calcification is present, without any  evidence of aortic  stenosis.  7. The inferior vena cava is dilated in size with <50% respiratory  variability, suggesting right atrial pressure of 15 mmHg.   FINDINGS  Left Ventricle: Left ventricular ejection fraction, by estimation, is 55  to 60%. The left ventricle has normal function. The left ventricle has no  regional wall motion abnormalities. The left ventricular internal cavity  size was mildly dilated. There is  mild left ventricular hypertrophy. Left ventricular diastolic parameters  are  indeterminate.   Right Ventricle: The right ventricular size is normal. No increase in  right ventricular wall thickness. Right ventricular systolic function is  normal. There is moderately elevated pulmonary artery systolic pressure.  The tricuspid regurgitant velocity is  2.89 m/s, and with an assumed right atrial pressure of 15 mmHg, the  estimated right ventricular systolic pressure is 17.6 mmHg.   Left Atrium: Left atrial size was moderately dilated.   Right Atrium: Right atrial size was mildly dilated.   Pericardium: Trivial pericardial effusion is present.   Mitral Valve: The mitral valve is normal in structure. Trivial mitral  valve regurgitation. No evidence of mitral valve stenosis.   Tricuspid Valve: The tricuspid valve is normal in structure. Tricuspid  valve regurgitation is trivial.   Aortic Valve: The aortic valve is tricuspid. Aortic valve regurgitation is  not visualized. Aortic valve sclerosis/calcification is present, without  any evidence of aortic stenosis. Aortic valve peak gradient measures 13.2  mmHg.   Pulmonic Valve: The pulmonic valve was normal in structure. Pulmonic valve  regurgitation is not visualized.   Aorta: The aortic root is normal in size and structure.   Venous: The inferior vena cava is dilated in size with less than 50%  respiratory variability, suggesting right atrial pressure of 15 mmHg.   IAS/Shunts: No atrial level shunt detected by color flow Doppler.     LEFT VENTRICLE  PLAX 2D  LVIDd:     6.50 cm  Diastology  LVIDs:     3.80 cm  LV e' medial:  7.58 cm/s  LV PW:     1.20 cm  LV E/e' medial: 17.2  LV IVS:    1.20 cm  LV e' lateral:  10.70 cm/s  LVOT diam:   2.00 cm  LV E/e' lateral: 12.1  LV SV:     102  LV SV Index:  42  LVOT Area:   3.14 cm     RIGHT VENTRICLE       IVC  RV S prime:   16.10 cm/s IVC diam: 2.40 cm  TAPSE (M-mode): 3.2 cm   LEFT ATRIUM       Index     RIGHT ATRIUM      Index  LA diam:    4.50 cm 1.87 cm/m  RA Area:   18.40 cm  LA Vol (A2C):  110.0 ml 45.66 ml/m RA Volume:  51.00 ml 21.17 ml/m  LA Vol (A4C):  95.8 ml 39.77 ml/m  LA Biplane Vol: 107.0 ml 44.42 ml/m  AORTIC VALVE         PULMONIC VALVE  AV Area (Vmax): 2.87 cm   PV Vmax:    1.14 m/s  AV Vmax:    182.00 cm/s  PV Peak grad: 5.2 mmHg  AV Peak Grad:  13.2 mmHg  LVOT Vmax:   166.00 cm/s  LVOT Vmean:   109.000 cm/s  LVOT VTI:    0.325 m    AORTA  Ao Root diam: 3.20 cm  Ao Asc diam: 3.60 cm  MITRAL VALVE        TRICUSPID VALVE  MV Area (PHT): 4.21 cm   TR Peak grad:  33.4 mmHg  MV Decel Time: 180 msec   TR Vmax:    289.00 cm/s  MV E velocity: 130.00 cm/s  MV A velocity: 93.00 cm/s  SHUNTS  MV E/A ratio: 1.40     Systemic VTI: 0.32 m               Systemic Diam: 2.00 cm   Daniel [name]  Electronically signed by Franki Monte  Signature Date/Time: 06/14/2021/4:19:14 PM      Final   Imaging Info  ECHOCARDIOGRAM COMPLETE (Order #[id number]) on 06/13/21   Study History  ECHOCARDIOGRAM COMPLETE (Order #[id number]) on 06/13/21   [employer] Images  Show images for ECHOCARDIOGRAM COMPLETE   Images on [employer]  Show images for Graziani, Demitri C   Performing Technologist/Nurse  Performing Technologist/Nurse: Jefferey Pica    Reason for Exam Priority: Anticipated Discharge Not on file   Patient Data  Height  71 in   BP  171/75 mmHg              Surgical History   Surgical History   No past medical history on file.   Other Surgical History   Procedure Laterality Date Comment Source A/V FISTULAGRAM N/A 08/15/2020 Procedure: A/V FISTULAGRAM - Left Upper; Surgeon: Cherre Robins, MD; Location: [place] [facility]; Service: Cardiovascular; Laterality: N/A;  ABDOMINAL AORTOGRAM W/LOWER  EXTREMITY N/A 04/08/2021 Procedure: ABDOMINAL AORTOGRAM W/LOWER EXTREMITY; Surgeon: Waynetta Sandy, MD; Location: [place] [facility]; Service: Cardiovascular; Laterality: N/A;  AMPUTATION Left 04/16/2021 Procedure: LEFT BELOW KNEE AMPUTATION; Surgeon: Angelia Mould, MD; Location: [place]; Service: Vascular; Laterality: Left;  AMPUTATION Left 06/17/2021 Procedure: REVISION AMPUTATION BELOW KNEE; Surgeon: Broadus John, MD; Location: [place]; Service: Vascular; Laterality: Left;  APPENDECTOMY     APPLICATION OF WOUND VAC Left 11/10/9676 Procedure: APPLICATION OF WOUND VAC; Surgeon: Broadus John, MD; Location: [place]; Service: Vascular; Laterality: Left;  AV FISTULA PLACEMENT Left 08/03/2019 Procedure: LEFT ARM ARTERIOVENOUS (AV) CEPHALIC FISTULA CREATION; Surgeon: Waynetta Sandy, MD; Location: [place]; Service: Vascular; Laterality: Left;  BIOPSY  06/30/2019 Procedure: BIOPSY; Surgeon: Ronald Lobo, MD; Location:  [facility] END[facility]COPY; Service: Endoscopy;;  BIOPSY  08/02/2019 Procedure: BIOPSY; Surgeon: Yetta Flock, MD; Location: [facility] ENDOSCOPY; Service: Gastroenterology;;  BIOPSY  02/08/2021 Procedure: BIOPSY; Surgeon: Sharyn Creamer, MD; Location: [facility] ENDOSCOPY; Service: Gastroenterology;;  BIOPSY  02/24/2021 Procedure: BIOPSY; Surgeon: [name], MD; Location: [facility] ENDOSCOPY; Service: Gastroenterology;;  BIOPSY  03/26/2021 Procedure: BIOPSY; Surgeon: Lavena Bullion, DO; Location: [place]; Service: Gastroenterology;;  BIOPSY  08/06/2021 Procedure: BIOPSY; Surgeon: [name], MD; Location: [place]; Service: Gastroenterology;;  BIOPSY  09/15/2021 Procedure: BIOPSY; Surgeon: Sharyn Creamer, MD; Location: [place]; Service: Gastroenterology;;  COLONOSCOPY  01/23/2012 Procedure: COLONOSCOPY; Surgeon: Danie Binder, MD; [facility]; Service: Endoscopy; Laterality: N/A; 11:10 AM  COLONOSCOPY  WITH PROPOFOL N/A 06/30/2019 Procedure: COLONOSCOPY WITH PROPOFOL; Surgeon: Ronald Lobo, MD; Location: [place]; Service: Endoscopy; Laterality: N/A;  ENTEROSCOPY N/A 08/02/2019 Procedure: ENTEROSCOPY; Surgeon: Yetta Flock, MD; Location: [facility] ENDOSCOPY; Service: Gastroenterology; Laterality: N/A;  ENTEROSCOPY N/A 02/24/2021 Procedure: ENTEROSCOPY; Surgeon: [name], MD; Location: [facility] ENDOSCOPY; Service: Gastroenterology; Laterality: N/A;  ENTEROSCOPY N/A 03/26/2021 Procedure: ENTEROSCOPY; Surgeon: Lavena Bullion, DO; Location: [place]; Service: Gastroenterology; Laterality: N/A;  ENTEROSCOPY N/A 07/05/2021 Procedure: ENTEROSCOPY; Surgeon: Carol Ada, MD; Location: [place]; Service: Gastroenterology; Laterality: N/A;  ENTEROSCOPY N/A 08/06/2021 Procedure: ENTEROSCOPY; Surgeon: [name], MD; Location: [facility] ENDOSCOPY; Service: Gastroenterology; Laterality: N/A;  ENTEROSCOPY N/A 08/24/2021 Procedure: ENTEROSCOPY; Surgeon: Ladene Artist, MD; Location: [facility] ENDOSCOPY; Service: Gastroenterology; Laterality: N/A;  ENTEROSCOPY N/A 09/15/2021 Procedure: ENTEROSCOPY; Surgeon: Sharyn Creamer, MD; Location: [facility] ENDOSCOPY; Service: Gastroenterology; Laterality: N/A;  ENTEROSCOPY N/A 10/06/2021 Procedure: ENTEROSCOPY; Surgeon: [name], MD; Location: [facility] ENDOSCOPY; Service: Gastroenterology; Laterality: N/A;  ESOPHAGOGASTRODUODENOSCOPY N/A 08/10/2020 Procedure: ESOPHAGOGASTRODUODENOSCOPY (EGD); Surgeon: Jerene Bears, MD; Location: [facility] ENDOSCOPY; Service: Gastroenterology; Laterality: N/A;  ESOPHAGOGASTRODUODENOSCOPY (EGD) WITH PROPOFOL N/A 06/30/2019 Procedure: ESOPHAGOGASTRODUODENOSCOPY (EGD) WITH PROPOFOL; Surgeon: Ronald Lobo, MD; Location: [place]; Service: Endoscopy; Laterality: N/A;  ESOPHAGOGASTRODUODENOSCOPY (EGD) WITH PROPOFOL N/A 01/12/2021 Procedure: ESOPHAGOGASTRODUODENOSCOPY (EGD) WITH PROPOFOL; Surgeon:  Sharyn Creamer, MD; Location: [place]; Service: Gastroenterology; Laterality: N/A;  ESOPHAGOGASTRODUODENOSCOPY (EGD) WITH PROPOFOL N/A 02/08/2021 Procedure: ESOPHAGOGASTRODUODENOSCOPY (EGD) WITH PROPOFOL; Surgeon: Sharyn Creamer, MD; Location: [place]; Service: Gastroenterology; Laterality: N/A;  FISTULA SUPERFICIALIZATION Left 10/17/2019 Procedure: LEFT UPPER EXTREMITY FISTULA REVISION, SIDE BRANCH LIGATION, AND SUPERFICIALIZATION; Surgeon: Marty Heck, MD; Location: [place]; Service: Vascular; Laterality: Left;  FISTULA SUPERFICIALIZATION Left 93/90/3009 Procedure: PLICATION OF ANEURYSM LEFT ARTERIOVENOUS FISTULA; Surgeon: Angelia Mould, MD; Location: [place]; Service: Vascular; Laterality: Left;  FLEXIBLE SIGMOIDOSCOPY N/A 07/05/2021 Procedure: FLEXIBLE SIGMOIDOSCOPY; Surgeon: Carol Ada, MD; Location: [place]; Service: Gastroenterology; Laterality: N/A;  GIVENS CAPSULE STUDY N/A 06/30/2019 Procedure: GIVENS CAPSULE STUDY; Surgeon: Ronald Lobo, MD; Location: [place]; Service: Endoscopy; Laterality: N/A;  GIVENS CAPSULE STUDY N/A 08/25/2021 Procedure: GIVENS CAPSULE STUDY; Surgeon: Ladene Artist, MD; Location: [place]; Service: Gastroenterology; Laterality: N/A;  GIVENS CAPSULE STUDY N/A 10/06/2021 Procedure: GIVENS CAPSULE STUDY; Surgeon: [name], MD; Location: [place]; Service: Gastroenterology; Laterality: N/A;  HEMOSTASIS CLIP PLACEMENT  08/10/2020 Procedure: HEMOSTASIS CLIP PLACEMENT; Surgeon: Jerene Bears, MD; Location: [place]; Service: Gastroenterology;;  HEMOSTASIS CLIP PLACEMENT  01/12/2021 Procedure: HEMOSTASIS CLIP PLACEMENT; Surgeon: Sharyn Creamer, MD; Location: [place]; Service: Gastroenterology;;  HEMOSTASIS CONTROL  08/02/2019 Procedure: HEMOSTASIS CONTROL; Surgeon: Yetta Flock, MD; Location: [place]; Service: Gastroenterology;;  HOT HEMOSTASIS  02/24/2021 Procedure:  HOT HEMOSTASIS (ARGON PLASMA COAGULATION/BICAP); Surgeon: [name], MD; Location: [facility] ENDOSCOPY; Service: Gastroenterology;;  HOT HEMOSTASIS N/A 03/26/2021 Procedure: HOT HEMOSTASIS (ARGON PLASMA COAGULATION/BICAP); Surgeon: Lavena Bullion, DO; Location: [facility] ENDOSCOPY; Service: Gastroenterology; Laterality: N/A;  HOT HEMOSTASIS N/A 07/05/2021 Procedure: HOT HEMOSTASIS (ARGON PLASMA COAGULATION/BICAP); Surgeon: Carol Ada, MD; Location: [place]; Service: Gastroenterology; Laterality: N/A;  HOT HEMOSTASIS N/A 09/15/2021 Procedure: HOT HEMOSTASIS (ARGON PLASMA COAGULATION/BICAP); Surgeon: Sharyn Creamer, MD; Location: [place]; Service: Gastroenterology; Laterality: N/A;  HOT HEMOSTASIS N/A 10/06/2021 Procedure: HOT HEMOSTASIS (ARGON PLASMA COAGULATION/BICAP); Surgeon: [name], MD; Location: [place]; Service: Gastroenterology; Laterality: N/A;  INCISION AND DRAINAGE ABSCESS N/A 06/29/2016 Procedure: INCISION AND DRAINAGE ABDOMINAL WALL ABSCESS; Surgeon: Alphonsa Overall, MD; Location: [facility]; Service: General; Laterality: N/A;  INSERTION OF DIALYSIS CATHETER Right 08/03/2019 Procedure: INSERTION OF DIALYSIS CATHETER; Surgeon: Waynetta Sandy, MD; Location: [place]; Service: Vascular; Laterality: Right;  INSERTION OF DIALYSIS CATHETER Right 10/22/2019 Procedure: INSERTION OF 23CM TUNNELED DIALYSIS CATHETER RIGHT INTERNAL JUGULAR; Surgeon: Angelia Mould, MD; Location: [place]; Service: Vascular; Laterality: Right;  INSERTION OF DIALYSIS CATHETER Right 08/12/2020 Procedure: INSERTION OF Right internal Jugular TUNNELED DIALYSIS CATHETER.; Surgeon: Waynetta Sandy, MD; Location: [place]; Service: Vascular; Laterality: Right;  INSERTION OF DIALYSIS CATHETER N/A 04/19/2021 Procedure: INSERTION OF TUNNELED DIALYSIS CATHETER; Surgeon: Angelia Mould, MD; Location: [place]; Service: Vascular; Laterality: N/A;  IR 3D  INDEPENDENT WKST  11/07/2021   IR ANGIOGRAM SELECTIVE EACH ADDITIONAL VESSEL  11/07/2021   IR ANGIOGRAM SELECTIVE EACH ADDITIONAL VESSEL  11/07/2021   IR ANGIOGRAM SELECTIVE EACH ADDITIONAL VESSEL  11/07/2021   IR ANGIOGRAM VISCERAL SELECTIVE  11/07/2021   IR EMBO ARTERIAL NOT HEMORR HEMANG INC GUIDE ROADMAPPING  11/07/2021   IR RADIOLOGIST EVAL & MGMT  09/12/2021   IR US GUIDE VASC ACCESS LEFT  11/07/2021   Left heel surgery     LOWER EXTREMITY ANGIOGRAPHY N/A 07/08/2021 Procedure: Lower Extremity Angiography; Surgeon: Cherre Robins, MD; Location: [place] [facility]; Service: Cardiovascular; Laterality: N/A;  PENILE BIOPSY N/A 03/26/2020 Procedure: PENILE ULCER DEBRIDEMENT; Surgeon: Remi Haggard, MD; Location: [facility]; Service: Urology; Laterality: N/A; 30 MINS  PERIPHERAL VASCULAR INTERVENTION Right 07/08/2021 Procedure: PERIPHERAL VASCULAR INTERVENTION; Surgeon: Cherre Robins, MD; Location: [place] [facility]; Service: Cardiovascular; Laterality: Right;  SCLEROTHERAPY  01/12/2021 Procedure: SCLEROTHERAPY; Surgeon: Sharyn Creamer, MD; Location: [facility] ENDOSCOPY; Service: Gastroenterology;;  Clide Deutscher  02/24/2021 Procedure: Clide Deutscher; Surgeon: [name], MD; Location: [facility] ENDOSCOPY; Service: Gastroenterology;;  SUBMUCOSAL TATTOO INJECTION  08/24/2021 Procedure: SUBMUCOSAL TATTOO INJECTION; Surgeon: Ladene Artist, MD; Location: [facility] ENDOSCOPY; Service: Gastroenterology;;  SUBMUCOSAL TATTOO INJECTION  09/15/2021 Procedure: SUBMUCOSAL TATTOO INJECTION; Surgeon: Sharyn Creamer, MD; Location: [facility] ENDOSCOPY; Service: Gastroenterology;;     Implants  Permanent Stent  Stent Viabahn [phone] - [id number] - Implanted (Left) Fistula AV  Inventory item: Mercy Moore [id number] Model/Cat number: [id number] Serial number: [id number] Manufacturer: [employer] Device identifier: [id number] Device identifier type: GS1 Area Of Implantation: Fistula  AV    As of 08/15/2020   Status: Implanted     Stent Eluvia 7x40x130 - [id number] - Implanted (Right)  Inventory item: Marlyne Beards [id number] Model/Cat number: [id number] Manufacturer: Wilkie Aye Lot number: [id number] Device identifier: [id number] Device identifier type: GS1  As of 07/08/2021   Status: Implanted      Encounter-Level Documents on 06/13/2021:  Scan on 06/24/2021 12:15 PM by Default, Provider, MD Document on 06/21/2021 4:15 PM by Julianne Handler, RN: IP After Visit Summary Document on 06/17/2021 8:28 PM by Brenton Grills, RRT: ED PB Billing Extract Scan on 06/20/2021 9:47 PM by Default, Provider, MD Scan on 06/24/2021 8:32 AM by Default, Provider, MD Scan on 06/17/2021 9:11 PM by Default, Provider, MD Electronic signature on 06/13/2021 11:33 AM - 1 of 3 e-signatures recorded Electronic signature on 06/13/2021 10:48 AM - 1 of 2 e-signatures recorded Scan on 06/14/2021 2:02 PM by Carmon Ginsberg Documents on 06/13/2021:  Scan on 06/24/2021 12:15 PM by Default, Provider, MD Scan on 06/18/2021 2:10 PM by Default, Provider, MD Scan on 06/14/2021 6:35 PM by Default, Provider, MD Scan on 06/14/2021 4:20 PM by Default, Provider, MD Scan on 09/12/2021 4:19 PM by Default, Provider, MD    Resulted by:  Signed Date/Time  Phone Pager Larey Dresser 06/14/2021 4:19 PM [phone]   External Result Report  External Result Report    ECHOCARDIOGRAM COMPLETE: Patient Communication   Add Comments  Not seen    Existing Charges  Charge Line Charge Code Status Charge Trigger Charge Type [id number] [facility] Tte 2d Echo Complete ([id number]) [facility] Billing Imaging end exam Technical [id number] Echo [place] Imaging result study Professional      Neuro/Psych negative neurological ROS     GI/Hepatic Neg liver ROS, PUD, GERD  ,  Endo/Other   negative endocrine ROSdiabetes  Renal/GU Dialysis and ESRFRenal disease     Musculoskeletal negative musculoskeletal ROS (+)   Abdominal   Peds  Hematology negative hematology ROS (+) Blood dyscrasia, anemia ,   Anesthesia Other Findings   Reproductive/Obstetrics                            Anesthesia Physical Anesthesia Plan  ASA: 4  Anesthesia Plan: MAC   Post-op Pain Management: Minimal or no pain anticipated   Induction:   PONV Risk Score and Plan: Propofol infusion  Airway Management Planned: Natural Airway and Simple Face Mask  Additional Equipment:   Intra-op Plan:   Post-operative Plan:   Informed Consent:   Plan Discussed with: Anesthesiologist  Anesthesia Plan Comments:         Anesthesia Quick Evaluation

## 2021-11-14 NOTE — Progress Notes (Signed)
PROGRESS NOTE   Daniel [name]  KYH:[id number] DOB: 02-01-1961 DOA: 11/12/2021 PCP: [name], NP  Brief Narrative:  61 year old black male known chronic hep C HCC CA follows with 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 ESRD MWF, HTN, Left BKA Peripheral arterial disease status post R SFA stent 07/08/2021 now off both aspirin and Plavix Recent evaluation 6/10-6/15/2023 with melena found to have albumin and underwent small bowel enteroscopy with friable gastric polyps status post [facility] endoscopy was inconclusive and patient was discharged on p.o. Protonix--40 twice daily-low-dose aspirin discontinued   Came to ED 7/17 with several episodes of dark stool without abdominal pain neck pain however Hemoglobin down from 8.9-->7.2 Patient started on Protonix gtt. 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 until procedure, continue Protonix 40 twice daily Chronic hep C with hepatocellular carcinoma status post IR radioembolization 11/07/2021 Outpatient further follow-up with oncology Patient's thrombocytopenia is likely secondary to bleeding in combination with his cancer ESRD MWF Check phosphorus at next evaluation Check iron studies Peripheral arterial/peripheral vascular disease with R SFA stent 07/08/2021--has prior left BKA Cannot take aspirin Plavix secondary to bleeds Risk of restenosis-defer to vascular for further planning none present  DVT prophylaxis: SCD Code Status: Full Family Communication:  Disposition:  Status is: Inpatient Remains inpatient appropriate because: Unclear if source of  bleed   Consultants:  Renal Gastroenterology  Procedures: None  Antimicrobials: No   Subjective: Awake coherent still having some dark stool no vomiting No nausea No chest pain No fever no chills  Objective: Vitals:   11/13/21 2048 11/14/21 0300 11/14/21 0459 11/14/21 0730  BP: (!) 171/77  (!) 172/59 (!) 155/61  Pulse: 86 84 84 87  Resp: '17 14 13   '$ Temp: 98.2 F (36.8 C)  98 F (36.7 C) 98.7 F (37.1 C)  TempSrc: Oral  Oral Oral  SpO2: 98% 92% 95% 97%  Weight:      Height:        Intake/Output Summary (Last 24 hours) at 11/14/2021 0944 Last data filed at 11/13/2021 1724 Gross per 24 hour  Intake --  Output 3900 ml  Net -3900 ml   Filed Weights   11/12/21 1913 11/13/21 1820  Weight: 108.9 kg 105.9 kg    Examination:  Alert coherent no distress EOMI NCAT no focal deficit CTA B no added sound rales or rhonchi S1-S2 no murmur Abdomen soft no rebound no guarding, left BKA seems stable  Data Reviewed: personally reviewed   CBC    Component Value Date/Time   WBC 3.8 (L) 11/14/2021 0407   RBC 2.46 (L) 11/14/2021 0407   HGB 7.9 (L) 11/14/2021 0407   HGB 9.0 (L) 10/03/2021 0900   HGB 8.4 (L) 08/22/2020 1141   HCT 23.3 (L) 11/14/2021 0407   HCT 25.0 (L) 08/22/2020 1141   PLT 114 (L) 11/14/2021 0407   PLT 142 (L) 10/03/2021 0900   PLT 184 08/22/2020 1141   MCV 94.7 11/14/2021 0407   MCV 94 08/22/2020 1141   MCH 32.1 11/14/2021 0407   MCHC 33.9 11/14/2021 0407   RDW 18.2 (H) 11/14/2021 0407   RDW 16.7 (  H) 08/22/2020 1141   LYMPHSABS 1.1 11/07/2021 0830   LYMPHSABS 1.8 08/22/2020 1141   MONOABS 0.6 11/07/2021 0830   EOSABS 0.0 11/07/2021 0830   EOSABS 0.3 08/22/2020 1141   BASOSABS 0.0 11/07/2021 0830   BASOSABS 0.0 08/22/2020 1141      Latest Ref Rng & Units 11/14/2021    4:07 AM 11/13/2021    3:51 AM 11/12/2021    8:50 AM  CMP  Glucose 70 - 99 mg/dL 85  95  87   BUN 8 - 23 mg/dL 29  67  59   Creatinine 0.61 - 1.24 mg/dL 6.78  12.18  10.25    Sodium 135 - 145 mmol/L 134  136  136   Potassium 3.5 - 5.1 mmol/L 4.0  4.1  4.2   Chloride 98 - 111 mmol/L 92  96  93   CO2 22 - 32 mmol/L '26  19  17   '$ Calcium 8.9 - 10.3 mg/dL 8.6  8.3  8.5   Total Protein 6.5 - 8.1 g/dL 8.3  8.1  8.2   Total Bilirubin 0.3 - 1.2 mg/dL 1.1  0.7  1.2   Alkaline Phos 38 - 126 U/L 129  122  141   AST 15 - 41 U/L 47  44  47   ALT 0 - 44 U/L '18  16  17      '$ Radiology Studies: No results found.   Scheduled Meds:  amLODipine  5 mg Oral Daily   atorvastatin  40 mg Oral Daily   Chlorhexidine Gluconate Cloth  6 each Topical Q0600   gabapentin  300 mg Oral QHS   pantoprazole  40 mg Oral BID   Continuous Infusions:   LOS: 1 day   Time spent: 101  Nita Sells, MD [facility] To contact the attending provider between 7A-7P or the covering provider during after hours 7P-7A, please log into the web site [email or web address] and access using universal [place] password for that web site. If you do not have the password, please call the hospital operator.  11/14/2021, 9:44 AM

## 2021-11-15 DIAGNOSIS — D62 Acute posthemorrhagic anemia: Secondary | ICD-10-CM | POA: Diagnosis not present

## 2021-11-15 LAB — COMPREHENSIVE METABOLIC PANEL
ALT: 18 U/L (ref 0–44)
AST: 47 U/L — ABNORMAL HIGH (ref 15–41)
Albumin: 2.5 g/dL — ABNORMAL LOW (ref 3.5–5.0)
Alkaline Phosphatase: 129 U/L — ABNORMAL HIGH (ref 38–126)
Anion gap: 16 — ABNORMAL HIGH (ref 5–15)
BUN: 29 mg/dL — ABNORMAL HIGH (ref 8–23)
CO2: 26 mmol/L (ref 22–32)
Calcium: 8.6 mg/dL — ABNORMAL LOW (ref 8.9–10.3)
Chloride: 92 mmol/L — ABNORMAL LOW (ref 98–111)
Creatinine, Ser: 6.78 mg/dL — ABNORMAL HIGH (ref 0.61–1.24)
GFR, Estimated: 9 mL/min — ABNORMAL LOW (ref >60)
Glucose, Bld: 85 mg/dL (ref 70–99)
Potassium: 4 mmol/L (ref 3.5–5.1)
Sodium: 134 mmol/L — ABNORMAL LOW (ref 135–145)
Total Bilirubin: 1.1 mg/dL (ref 0.3–1.2)
Total Protein: 8.3 g/dL — ABNORMAL HIGH (ref 6.5–8.1)

## 2021-11-15 LAB — CBC
HCT: 22 % — ABNORMAL LOW (ref 39.0–52.0)
Hemoglobin: 7.3 g/dL — ABNORMAL LOW (ref 13.0–17.0)
MCH: 32.6 pg (ref 26.0–34.0)
MCHC: 33.2 g/dL (ref 30.0–36.0)
MCV: 98.2 fL (ref 80.0–100.0)
Platelets: 97 10*3/uL — ABNORMAL LOW (ref 150–400)
RBC: 2.24 MIL/uL — ABNORMAL LOW (ref 4.22–5.81)
RDW: 17.8 % — ABNORMAL HIGH (ref 11.5–15.5)
WBC: 3.6 10*3/uL — ABNORMAL LOW (ref 4.0–10.5)
nRBC: 0 % (ref 0.0–0.2)

## 2021-11-15 LAB — RENAL FUNCTION PANEL
Albumin: 2.3 g/dL — ABNORMAL LOW (ref 3.5–5.0)
Anion gap: 12 (ref 5–15)
BUN: 40 mg/dL — ABNORMAL HIGH (ref 8–23)
CO2: 25 mmol/L (ref 22–32)
Calcium: 8.4 mg/dL — ABNORMAL LOW (ref 8.9–10.3)
Chloride: 94 mmol/L — ABNORMAL LOW (ref 98–111)
Creatinine, Ser: 8.48 mg/dL — ABNORMAL HIGH (ref 0.61–1.24)
GFR, Estimated: 7 mL/min — ABNORMAL LOW (ref >60)
Glucose, Bld: 88 mg/dL (ref 70–99)
Phosphorus: 6 mg/dL — ABNORMAL HIGH (ref 2.5–4.6)
Potassium: 3.8 mmol/L (ref 3.5–5.1)
Sodium: 131 mmol/L — ABNORMAL LOW (ref 135–145)

## 2021-11-15 MED ORDER — TRAZODONE HCL 50 MG PO TABS: 50.0000 mg | ORAL_TABLET | Freq: Every evening | ORAL | 1 refills | Status: DC | PRN

## 2021-11-15 MED ORDER — GABAPENTIN 300 MG PO CAPS: 300.0000 mg | ORAL_CAPSULE | Freq: Every day | ORAL | 3 refills | Status: DC

## 2021-11-15 NOTE — Progress Notes (Signed)
Contacted by Juanell Fairly, renal NP. Pt was d/c prior to receiving HD today. Contacted pt via phone. Inquired of pt if he had contacted clinic to schedule appt for today or tomorrow. Pt stated he had not but will call. Navigator informed pt that navigator to f/u with clinic regarding pt's need for treatment. Contacted [facility] and spoke to [place]. Hilda Blades states that clinic can treatment pt now and to call pt to speak with him regarding appt time for today. Clinic likely unable to treat pt tomorrow. Contacted Juanell Fairly to make her aware that clinic is to call pt about appt this afternoon and that clinic is requesting orders and paperwork. Velva Harman to provide info to clinic.   Melven Sartorius Renal Navigator [phone]

## 2021-11-15 NOTE — Discharge Summary (Signed)
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 ESRD MWF, HTN, Left BKA Peripheral arterial disease status post R SFA stent 07/08/2021 now off both aspirin and Plavix Recent evaluation 6/10-6/15/2023 with melena found to have albumin and underwent small bowel enteroscopy with friable gastric polyps status post APC in the duodenum Hospital endoscopy was inconclusive and patient was discharged on p.o. Protonix--40 twice daily-low-dose aspirin discontinued   Came to ED 7/17 with several episodes of dark stool without abdominal pain neck pain however Hemoglobin down from 8.9-->7.2 Patient started on Protonix gtt. 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 Had melanotic stools which stopped after the scope Scope showed no specific bleeding site Chronic hep C with hepatocellular carcinoma status post IR radioembolization 11/07/2021 Outpatient further follow-up with oncology--May need consideration for IV iron Patient's thrombocytopenia is likely secondary to bleeding in combination with his cancer ESRD MWF Check phosphorus at next evaluation Check iron studies but this can be done as an outpatient Peripheral arterial/peripheral vascular disease with R SFA stent 07/08/2021--has prior left BKA Cannot take aspirin Plavix secondary to bleeds Risk of restenosis-defer to vascular for further planning none present  Procedures: Findings:      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      Diffuse mildly congested mucosa (cobblestoning) was found in the entire       examined stomach.      Nodular mucosa was found in the duodenal bulb.      The exam of the duodenum was otherwise normal.      A tattoo was seen at 110 cm (from the incisors). The tattoo site       appeared normal. The scope was advanced about 5cm distal to the tattoo.      No fresh or blood seen, no bleeding sources identified.   Consultations: Gastroenterology  Discharge Exam: Vitals:   11/15/21 0541 11/15/21 0756  BP: (!) 152/92 (!) 147/79  Pulse: 80 88  Resp:  16  Temp:  97.9 F (36.6 C)  SpO2:  94%    Subj on day of d/c   Awake coherent pleasant no distress  General Exam on discharge  EOMI NCAT no focal deficit no icterus no pallor Chest clear no  added sound no wheeze rales rhonchi S1-S2 no murmur no rub no gallop Abdomen soft no rebound no guarding BKA noted on left side  Discharge Instructions   Discharge Instructions     Diet - low sodium heart healthy   Complete by: As directed    Discharge instructions   Complete by: As directed    Please follow-up closely with the oncologist with regards to further planning for your cancer  and you may need to consider discussions about chemo in the next several weeks We will need lab work done probably in about a week so when you do follow-up-make sure that they order labs to ensure that your hemoglobin is stable Report any dark or tarry stool to your regular doctor and for Evaluation at urgent care or the emergency room-we have refilled her trazodone-stop taking loperamide unless you really need it make sure that you take the pantoprazole twice daily-you may need iron infusions in the outpatient setting to be set up by your oncologist   Increase activity slowly   Complete by: As directed    No wound care   Complete by: As directed       Allergies as of 11/15/2021       Reactions   Seroquel [quetiapine] Other (See Comments)   Tardive kinesia Dystonia    Dilaudid [hydromorphone Hcl] Itching, Other (See Comments)   Pt reports itchiness after IM injection         Medication List     STOP taking these medications    loperamide 2 MG tablet Commonly known as: Imodium A-D       TAKE these medications    amLODipine 5 MG tablet Commonly known as: NORVASC Take 1 tablet (5 mg total) by mouth daily.   atorvastatin 40 MG tablet Commonly known as: LIPITOR Take 1 tablet (40 mg total) by mouth daily.   gabapentin 300 MG capsule Commonly known as: NEURONTIN Take 1 capsule (300 mg total) by mouth at bedtime. What changed: when to take this   pantoprazole 40 MG tablet Commonly known as: PROTONIX Take 1 tablet (40 mg total) by mouth 2 (two) times daily before a meal.   traZODone 50 MG tablet Commonly known as: DESYREL Take 1 tablet (50 mg total) by mouth at bedtime as needed for sleep. What changed: how much to take       Allergies  Allergen Reactions   Seroquel [Quetiapine] Other (See Comments)    Tardive kinesia Dystonia    Dilaudid [Hydromorphone Hcl] Itching and Other (See Comments)    Pt reports itchiness after IM injection       The results of  significant diagnostics from this hospitalization (including imaging, microbiology, ancillary and laboratory) are listed below for reference.    Significant Diagnostic Studies: NM PRE Y90 LIVER SPECT LUNG SHUNT ASSESSMENT  Result Date: 11/11/2021 CLINICAL DATA:  Pre Y-90 mapping. Unresectable hepatocellular carcinoma. EXAM: NUCLEAR MEDICINE LIVER SCAN TECHNIQUE: Abdominal images were obtained in multiple projections after intrahepatic arterial injection of radiopharmaceutical. SPECT imaging was performed. Lung shunt calculation was performed. RADIOPHARMACEUTICALS:  4.41mllicurie MAA TECHNETIUM TO 6M ALBUMIN AGGREGATED COMPARISON:  CT 10/09/2021 FINDINGS: The injected microaggregated albumin localizes within the liver. No evidence of activity within the stomach, duodenum, or bowel. Calculated shunt fraction to the lungs equals 8.5%. IMPRESSION: 1. No significant extrahepatic radiotracer activity following intrahepatic arterial injection of MAA. 2. Lung shunt fraction equals 8.5% Electronically Signed   By: TKerby MoorsM.D.   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 EXAM: 1. Ultrasound-guided access of the left radial artery 2. Catheterization and angiography of the celiac, proper hepatic, and right and left hepatic arteries 3. Cone beam CT 4. MAA injection into the right superior hepatic artery MEDICATIONS: None. ANESTHESIA/SEDATION: Moderate (conscious) sedation was employed during this procedure. A total of Versed 4 mg and Fentanyl 200 mcg was administered intravenously. Moderate Sedation Time: 75 minutes. The patient's level of consciousness and vital signs were monitored continuously by radiology nursing throughout the procedure under my direct supervision. CONTRAST:  77m OMNIPAQUE IOHEXOL 300 MG/ML  SOLN, 631mOMNIPAQUE IOHEXOL 300 MG/ML SOLN, 6455mMNIPAQUE IOHEXOL 300 MG/ML SOLN FLUOROSCOPY TIME:  1.96.213y COMPLICATIONS: None immediate. PROCEDURE: Informed consent was obtained from the patient following explanation of the procedure, risks, benefits and alternatives. The patient understands, agrees and consents for the procedure. All questions were addressed. A time out was performed prior to the initiation of the procedure. Maximal barrier sterile technique utilized including caps, mask, sterile gowns, sterile gloves, large sterile drape, hand hygiene, and Betadine prep. A preliminary ultrasound of the left wrist was performed and demonstrates a patent left radial artery which measured up to 0.44 cm in diameter. Barbeau A. A permanent ultrasound image was recorded. Using a combination of fluoroscopy and ultrasound, an access site was determined. The overlying skin was anesthetized with 1% Lidocaine. Using ultrasound guidance, access into the left radial artery was obtained with visualization of needle entry into the vessel using standard micropuncture technique. Five French slender sheath was placed. Radial cocktail was administered as per protocol. An MG1 catheter was advanced into the celiac artery. Celiac angiography demonstrates conventional anatomy. The portal vein is patent with hepatopedal flow. A Progreat Omega microcatheter and Fathom 16 microwire were used to select the proper hepatic artery and angiography was performed in multiple obliquities, demonstrating a hypervascular mass in the right lobe, consistent with hepatocellular carcinoma. The microcatheter and wire were then used to select the right superior hepatic artery and angiogram was repeated. Angiography demonstrates supply to the targeted mass. There are at least 3 branches arising from the proximal right superior hepatic artery the directly supply the mass. Opacification of the satellite, smaller hepatoma is also seen. There is no  evidence of non-target extra-hepatic branches. Cone beam CT was performed, demonstrating peripherally enhancing mass in segment 8 measuring up to approximately 5.1 cm in maximum axial diameter. Given proximity to the central aspect of the liver, the left hepatic artery was then selected for angiogram. Left hepatic angiogram demonstrated patent vessels with indeterminate supply to the right-sided mass. Sub selection of the segment 4 hepatic artery was performed. Cone beam CT demonstrates no definite opacification of the segment 8 mass from the segment 4 arteries. The catheter was then repositioned into the right superior hepatic artery. Per protocol, MAA was then infused into the right superior hepatic artery. All wires and catheters were removed. Hemostasis was achieved using a TR band. There were no immediate complications. Peripheral pulses were unchanged1. The patient was transferred to the recovery area in stable condition. IMPRESSION: 1. Visceral angiography demonstrating two hypervascular masses in the right hepatic lobe, consistent with hepatocellular carcinoma. 2. MAA infusion into the right superior hepatic artery. 3. We will follow up shunt fraction calculations and calculate liver volumes in preparation for radioembolization therapy. DylRuthann Cancer  MD [facility] Radiology Electronically Signed   By: Ruthann Cancer M.D.   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 EXAM: 1. Ultrasound-guided access of the left radial artery 2. Catheterization and angiography of the celiac, proper hepatic, and right and left hepatic arteries 3. Cone beam CT 4. MAA injection into the right superior hepatic artery MEDICATIONS: None. ANESTHESIA/SEDATION: Moderate (conscious) sedation was employed during this  procedure. A total of Versed 4 mg and Fentanyl 200 mcg was administered intravenously. Moderate Sedation Time: 75 minutes. The patient's level of consciousness and vital signs were monitored continuously by radiology nursing throughout the procedure under my direct supervision. CONTRAST:  68m OMNIPAQUE IOHEXOL 300 MG/ML SOLN, 695mOMNIPAQUE IOHEXOL 300 MG/ML SOLN, 6440mMNIPAQUE IOHEXOL 300 MG/ML SOLN FLUOROSCOPY TIME:  1.95.465y COMPLICATIONS: None immediate. PROCEDURE: Informed consent was obtained from the patient following explanation of the procedure, risks, benefits and alternatives. The patient understands, agrees and consents for the procedure. All questions were addressed. A time out was performed prior to the initiation of the procedure. Maximal barrier sterile technique utilized including caps, mask, sterile gowns, sterile gloves, large sterile drape, hand hygiene, and Betadine prep. A preliminary ultrasound of the left wrist was performed and demonstrates a patent left radial artery which measured up to 0.44 cm in diameter. Barbeau A. A permanent ultrasound image was recorded. Using a combination of fluoroscopy and ultrasound, an access site was determined. The overlying skin was anesthetized with 1% Lidocaine. Using ultrasound guidance, access into the left radial artery was obtained with visualization of needle entry into the vessel using standard micropuncture technique. Five French slender sheath was placed. Radial cocktail was administered as per protocol. An MG1 catheter was advanced into the celiac artery. Celiac angiography demonstrates conventional anatomy. The portal vein is patent with hepatopedal flow. A Progreat Omega microcatheter and Fathom 16 microwire were used to select the proper hepatic artery and angiography was performed in multiple obliquities, demonstrating a hypervascular mass in the right lobe, consistent with hepatocellular carcinoma. The microcatheter and wire were then used  to select the right superior hepatic artery and angiogram was repeated. Angiography demonstrates supply to the targeted mass. There are at least 3 branches arising from the proximal right superior hepatic artery the directly supply the mass. Opacification of the satellite, smaller hepatoma is also seen. There is no evidence of non-target extra-hepatic branches. Cone beam CT was performed, demonstrating peripherally enhancing mass in segment 8 measuring up to approximately 5.1 cm in maximum axial diameter. Given proximity to the central aspect of the liver, the left hepatic artery was then selected for angiogram. Left hepatic angiogram demonstrated patent vessels with indeterminate supply to the right-sided mass. Sub selection of the segment 4 hepatic artery was performed. Cone beam CT demonstrates no definite opacification of the segment 8 mass from the segment 4 arteries. The catheter was then repositioned into the right superior hepatic artery. Per protocol, MAA was then infused into the right superior hepatic artery. All wires and catheters were removed. Hemostasis was achieved using a TR band. There were no immediate complications. Peripheral pulses were unchanged1. The patient was transferred to the recovery area in stable condition. IMPRESSION: 1. Visceral angiography demonstrating two hypervascular masses in the right hepatic lobe, consistent with hepatocellular carcinoma. 2. MAA infusion into the right superior  hepatic artery. 3. We will follow up shunt fraction calculations and calculate liver volumes in preparation for radioembolization therapy. Ruthann Cancer, MD [facility] Radiology Electronically Signed   By: Ruthann Cancer M.D.   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 EXAM: 1.  Ultrasound-guided access of the left radial artery 2. Catheterization and angiography of the celiac, proper hepatic, and right and left hepatic arteries 3. Cone beam CT 4. MAA injection into the right superior hepatic artery MEDICATIONS: None. ANESTHESIA/SEDATION: Moderate (conscious) sedation was employed during this procedure. A total of Versed 4 mg and Fentanyl 200 mcg was administered intravenously. Moderate Sedation Time: 75 minutes. The patient's level of consciousness and vital signs were monitored continuously by radiology nursing throughout the procedure under my direct supervision. CONTRAST:  2m OMNIPAQUE IOHEXOL 300 MG/ML SOLN, 633mOMNIPAQUE IOHEXOL 300 MG/ML SOLN, 6424mMNIPAQUE IOHEXOL 300 MG/ML SOLN FLUOROSCOPY TIME:  1.94.765y COMPLICATIONS: None immediate. PROCEDURE: Informed consent was obtained from the patient following explanation of the procedure, risks, benefits and alternatives. The patient understands, agrees and consents for the procedure. All questions were addressed. A time out was performed prior to the initiation of the procedure. Maximal barrier sterile technique utilized including caps, mask, sterile gowns, sterile gloves, large sterile drape, hand hygiene, and Betadine prep. A preliminary ultrasound of the left wrist was performed and demonstrates a patent left radial artery which measured up to 0.44 cm in diameter. Barbeau A. A permanent ultrasound image was recorded. Using a combination of fluoroscopy and ultrasound, an access site was determined. The overlying skin was anesthetized with 1% Lidocaine. Using ultrasound guidance, access into the left radial artery was obtained with visualization of needle entry into the vessel using standard micropuncture technique. Five French slender sheath was placed. Radial cocktail was administered as per protocol. An MG1 catheter was advanced into the celiac artery. Celiac angiography demonstrates conventional anatomy. The portal vein is  patent with hepatopedal flow. A Progreat Omega microcatheter and Fathom 16 microwire were used to select the proper hepatic artery and angiography was performed in multiple obliquities, demonstrating a hypervascular mass in the right lobe, consistent with hepatocellular carcinoma. The microcatheter and wire were then used to select the right superior hepatic artery and angiogram was repeated. Angiography demonstrates supply to the targeted mass. There are at least 3 branches arising from the proximal right superior hepatic artery the directly supply the mass. Opacification of the satellite, smaller hepatoma is also seen. There is no evidence of non-target extra-hepatic branches. Cone beam CT was performed, demonstrating peripherally enhancing mass in segment 8 measuring up to approximately 5.1 cm in maximum axial diameter. Given proximity to the central aspect of the liver, the left hepatic artery was then selected for angiogram. Left hepatic angiogram demonstrated patent vessels with indeterminate supply to the right-sided mass. Sub selection of the segment 4 hepatic artery was performed. Cone beam CT demonstrates no definite opacification of the segment 8 mass from the segment 4 arteries. The catheter was then repositioned into the right superior hepatic artery. Per protocol, MAA was then infused into the right superior hepatic artery. All wires and catheters were removed. Hemostasis was achieved using a TR band. There were no immediate complications. Peripheral pulses were unchanged1. The patient was transferred to the recovery area in stable condition. IMPRESSION: 1. Visceral angiography demonstrating  two hypervascular masses in the right hepatic lobe, consistent with hepatocellular carcinoma. 2. MAA infusion into the right superior hepatic artery. 3. We will follow up shunt fraction calculations and calculate liver volumes in preparation for radioembolization therapy. Ruthann Cancer, MD [facility] Radiology Electronically Signed   By: Ruthann Cancer M.D.   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 EXAM: 1. Ultrasound-guided access of the left radial artery 2. Catheterization and angiography of the celiac, proper hepatic, and right and left hepatic arteries 3. Cone beam CT 4. MAA injection into the right superior hepatic artery MEDICATIONS: None. ANESTHESIA/SEDATION: Moderate (conscious) sedation was employed during this procedure. A total of Versed 4 mg and Fentanyl 200 mcg was administered intravenously. Moderate Sedation Time: 75 minutes. The patient's level of consciousness and vital signs were monitored continuously by radiology nursing throughout the procedure under my direct supervision. CONTRAST:  51m OMNIPAQUE IOHEXOL 300 MG/ML SOLN, 65mOMNIPAQUE IOHEXOL 300 MG/ML SOLN, 6469mMNIPAQUE IOHEXOL 300 MG/ML SOLN FLUOROSCOPY TIME:  1.91.610y COMPLICATIONS: None immediate. PROCEDURE: Informed consent was obtained from the patient following explanation of the procedure, risks, benefits and alternatives. The patient understands, agrees and consents for the procedure. All questions were addressed. A time out was performed prior to the initiation of the procedure. Maximal barrier sterile technique utilized including caps, mask, sterile gowns, sterile gloves, large sterile drape, hand hygiene, and Betadine prep. A preliminary ultrasound of the left wrist was performed and demonstrates a patent left radial artery which measured up to 0.44 cm in diameter. Barbeau A. A permanent ultrasound image was recorded. Using a combination of fluoroscopy and ultrasound, an access site was determined. The overlying skin was anesthetized with 1% Lidocaine. Using ultrasound guidance, access into the left radial  artery was obtained with visualization of needle entry into the vessel using standard micropuncture technique. Five French slender sheath was placed. Radial cocktail was administered as per protocol. An MG1 catheter was advanced into the celiac artery. Celiac angiography demonstrates conventional anatomy. The portal vein is patent with hepatopedal flow. A Progreat Omega microcatheter and Fathom 16 microwire were used to select the proper hepatic artery and angiography was performed in multiple obliquities, demonstrating a hypervascular mass in the right lobe, consistent with hepatocellular carcinoma. The microcatheter and wire were then used to select the right superior hepatic artery and angiogram was repeated. Angiography demonstrates supply to the targeted mass. There are at least 3 branches arising from the proximal right superior hepatic artery the directly supply the mass. Opacification of the satellite, smaller hepatoma is also seen. There is no evidence of non-target extra-hepatic branches. Cone beam CT was performed, demonstrating peripherally enhancing mass in segment 8 measuring up to approximately 5.1 cm in maximum axial diameter. Given proximity to the central aspect of the liver, the left hepatic artery was then selected for angiogram. Left hepatic angiogram demonstrated patent vessels with indeterminate supply to the right-sided mass. Sub selection of the segment 4 hepatic artery was performed. Cone beam CT demonstrates no definite opacification of the segment 8 mass from the segment 4 arteries. The catheter was then repositioned into the right superior hepatic artery. Per protocol, MAA was then infused into the right superior hepatic artery. All wires and catheters were removed. Hemostasis was achieved using a TR band. There were no immediate complications. Peripheral  pulses were unchanged1. The patient was transferred to the recovery area in stable condition. IMPRESSION: 1. Visceral angiography  demonstrating two hypervascular masses in the right hepatic lobe, consistent with hepatocellular carcinoma. 2. MAA infusion into the right superior hepatic artery. 3. We will follow up shunt fraction calculations and calculate liver volumes in preparation for radioembolization therapy. Ruthann Cancer, MD [facility] Radiology Electronically Signed   By: Ruthann Cancer M.D.   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 EXAM: 1. Ultrasound-guided access of the left radial artery 2. Catheterization and angiography of the celiac, proper hepatic, and right and left hepatic arteries 3. Cone beam CT 4. MAA injection into the right superior hepatic artery MEDICATIONS: None. ANESTHESIA/SEDATION: Moderate (conscious) sedation was employed during this procedure. A total of Versed 4 mg and Fentanyl 200 mcg was administered intravenously. Moderate Sedation Time: 75 minutes. The patient's level of consciousness and vital signs were monitored continuously by radiology nursing throughout the procedure under my direct supervision. CONTRAST:  16m OMNIPAQUE IOHEXOL 300 MG/ML SOLN, 670mOMNIPAQUE IOHEXOL 300 MG/ML SOLN, 6469mMNIPAQUE IOHEXOL 300 MG/ML SOLN FLUOROSCOPY TIME:  1.95.053y COMPLICATIONS: None immediate. PROCEDURE: Informed consent was obtained from the patient following explanation of the procedure, risks, benefits and alternatives. The patient understands, agrees and consents for the procedure. All questions were addressed. A time out was performed prior to the initiation of the procedure. Maximal barrier sterile technique utilized including caps, mask, sterile gowns, sterile gloves, large sterile drape, hand hygiene, and Betadine prep. A preliminary ultrasound of the left wrist was  performed and demonstrates a patent left radial artery which measured up to 0.44 cm in diameter. Barbeau A. A permanent ultrasound image was recorded. Using a combination of fluoroscopy and ultrasound, an access site was determined. The overlying skin was anesthetized with 1% Lidocaine. Using ultrasound guidance, access into the left radial artery was obtained with visualization of needle entry into the vessel using standard micropuncture technique. Five French slender sheath was placed. Radial cocktail was administered as per protocol. An MG1 catheter was advanced into the celiac artery. Celiac angiography demonstrates conventional anatomy. The portal vein is patent with hepatopedal flow. A Progreat Omega microcatheter and Fathom 16 microwire were used to select the proper hepatic artery and angiography was performed in multiple obliquities, demonstrating a hypervascular mass in the right lobe, consistent with hepatocellular carcinoma. The microcatheter and wire were then used to select the right superior hepatic artery and angiogram was repeated. Angiography demonstrates supply to the targeted mass. There are at least 3 branches arising from the proximal right superior hepatic artery the directly supply the mass. Opacification of the satellite, smaller hepatoma is also seen. There is no evidence of non-target extra-hepatic branches. Cone beam CT was performed, demonstrating peripherally enhancing mass in segment 8 measuring up to approximately 5.1 cm in maximum axial diameter. Given proximity to the central aspect of the liver, the left hepatic artery was then selected for angiogram. Left hepatic angiogram demonstrated patent vessels with indeterminate supply to the right-sided mass. Sub selection of the segment 4 hepatic artery was performed. Cone beam CT demonstrates no definite opacification of the segment 8 mass from the segment 4 arteries. The catheter was then repositioned into the right superior hepatic  artery. Per protocol, MAA was then infused into the right  superior hepatic artery. All wires and catheters were removed. Hemostasis was achieved using a TR band. There were no immediate complications. Peripheral pulses were unchanged1. The patient was transferred to the recovery area in stable condition. IMPRESSION: 1. Visceral angiography demonstrating two hypervascular masses in the right hepatic lobe, consistent with hepatocellular carcinoma. 2. MAA infusion into the right superior hepatic artery. 3. We will follow up shunt fraction calculations and calculate liver volumes in preparation for radioembolization therapy. Ruthann Cancer, MD [facility] Radiology Electronically Signed   By: Ruthann Cancer M.D.   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 EXAM: 1. Ultrasound-guided access of the left radial artery 2. Catheterization and angiography of the celiac, proper hepatic, and right and left hepatic arteries 3. Cone beam CT 4. MAA injection into the right superior hepatic artery MEDICATIONS: None. ANESTHESIA/SEDATION: Moderate (conscious) sedation was employed during this procedure. A total of Versed 4 mg and Fentanyl 200 mcg was administered intravenously. Moderate Sedation Time: 75 minutes. The patient's level of consciousness and vital signs were monitored continuously by radiology nursing throughout the procedure under my direct supervision. CONTRAST:  46m OMNIPAQUE IOHEXOL 300 MG/ML SOLN, 63mOMNIPAQUE IOHEXOL 300 MG/ML SOLN, 6424mMNIPAQUE IOHEXOL 300 MG/ML SOLN FLUOROSCOPY TIME:  1.91.610y COMPLICATIONS: None immediate. PROCEDURE: Informed consent was obtained from the patient following explanation of the procedure, risks, benefits and alternatives. The patient  understands, agrees and consents for the procedure. All questions were addressed. A time out was performed prior to the initiation of the procedure. Maximal barrier sterile technique utilized including caps, mask, sterile gowns, sterile gloves, large sterile drape, hand hygiene, and Betadine prep. A preliminary ultrasound of the left wrist was performed and demonstrates a patent left radial artery which measured up to 0.44 cm in diameter. Barbeau A. A permanent ultrasound image was recorded. Using a combination of fluoroscopy and ultrasound, an access site was determined. The overlying skin was anesthetized with 1% Lidocaine. Using ultrasound guidance, access into the left radial artery was obtained with visualization of needle entry into the vessel using standard micropuncture technique. Five French slender sheath was placed. Radial cocktail was administered as per protocol. An MG1 catheter was advanced into the celiac artery. Celiac angiography demonstrates conventional anatomy. The portal vein is patent with hepatopedal flow. A Progreat Omega microcatheter and Fathom 16 microwire were used to select the proper hepatic artery and angiography was performed in multiple obliquities, demonstrating a hypervascular mass in the right lobe, consistent with hepatocellular carcinoma. The microcatheter and wire were then used to select the right superior hepatic artery and angiogram was repeated. Angiography demonstrates supply to the targeted mass. There are at least 3 branches arising from the proximal right superior hepatic artery the directly supply the mass. Opacification of the satellite, smaller hepatoma is also seen. There is no evidence of non-target extra-hepatic branches. Cone beam CT was performed, demonstrating peripherally enhancing mass in segment 8 measuring up to approximately 5.1 cm in maximum axial diameter. Given proximity to the central aspect of the liver, the left hepatic artery was then selected for  angiogram. Left hepatic angiogram demonstrated patent vessels with indeterminate supply to the right-sided mass. Sub selection of the segment 4 hepatic artery was performed. Cone beam CT demonstrates no definite opacification of the segment 8 mass from the segment 4  arteries. The catheter was then repositioned into the right superior hepatic artery. Per protocol, MAA was then infused into the right superior hepatic artery. All wires and catheters were removed. Hemostasis was achieved using a TR band. There were no immediate complications. Peripheral pulses were unchanged1. The patient was transferred to the recovery area in stable condition. IMPRESSION: 1. Visceral angiography demonstrating two hypervascular masses in the right hepatic lobe, consistent with hepatocellular carcinoma. 2. MAA infusion into the right superior hepatic artery. 3. We will follow up shunt fraction calculations and calculate liver volumes in preparation for radioembolization therapy. Ruthann Cancer, MD [facility] Radiology Electronically Signed   By: Ruthann Cancer M.D.   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 EXAM: 1. Ultrasound-guided access of the left radial artery 2. Catheterization and angiography of the celiac, proper hepatic, and right and left hepatic arteries 3. Cone beam CT 4. MAA injection into the right superior hepatic artery MEDICATIONS: None. ANESTHESIA/SEDATION: Moderate (conscious) sedation was employed during this procedure. A total of Versed 4 mg and Fentanyl 200 mcg was administered intravenously. Moderate Sedation Time: 75 minutes. The patient's level of consciousness and vital signs were monitored continuously by radiology nursing throughout the procedure under my direct  supervision. CONTRAST:  28m OMNIPAQUE IOHEXOL 300 MG/ML SOLN, 650mOMNIPAQUE IOHEXOL 300 MG/ML SOLN, 648mMNIPAQUE IOHEXOL 300 MG/ML SOLN FLUOROSCOPY TIME:  1.91.950y COMPLICATIONS: None immediate. PROCEDURE: Informed consent was obtained from the patient following explanation of the procedure, risks, benefits and alternatives. The patient understands, agrees and consents for the procedure. All questions were addressed. A time out was performed prior to the initiation of the procedure. Maximal barrier sterile technique utilized including caps, mask, sterile gowns, sterile gloves, large sterile drape, hand hygiene, and Betadine prep. A preliminary ultrasound of the left wrist was performed and demonstrates a patent left radial artery which measured up to 0.44 cm in diameter. Barbeau A. A permanent ultrasound image was recorded. Using a combination of fluoroscopy and ultrasound, an access site was determined. The overlying skin was anesthetized with 1% Lidocaine. Using ultrasound guidance, access into the left radial artery was obtained with visualization of needle entry into the vessel using standard micropuncture technique. Five French slender sheath was placed. Radial cocktail was administered as per protocol. An MG1 catheter was advanced into the celiac artery. Celiac angiography demonstrates conventional anatomy. The portal vein is patent with hepatopedal flow. A Progreat Omega microcatheter and Fathom 16 microwire were used to select the proper hepatic artery and angiography was performed in multiple obliquities, demonstrating a hypervascular mass in the right lobe, consistent with hepatocellular carcinoma. The microcatheter and wire were then used to select the right superior hepatic artery and angiogram was repeated. Angiography demonstrates supply to the targeted mass. There are at least 3 branches arising from the proximal right superior hepatic artery the directly supply the mass. Opacification of the  satellite, smaller hepatoma is also seen. There is no evidence of non-target extra-hepatic branches. Cone beam CT was performed, demonstrating peripherally enhancing mass in segment 8 measuring up to approximately 5.1 cm in maximum axial diameter. Given proximity to the central aspect of the liver, the left hepatic artery was then selected for angiogram. Left hepatic angiogram demonstrated patent vessels with indeterminate supply to the right-sided mass. Sub selection of the segment  4 hepatic artery was performed. Cone beam CT demonstrates no definite opacification of the segment 8 mass from the segment 4 arteries. The catheter was then repositioned into the right superior hepatic artery. Per protocol, MAA was then infused into the right superior hepatic artery. All wires and catheters were removed. Hemostasis was achieved using a TR band. There were no immediate complications. Peripheral pulses were unchanged1. The patient was transferred to the recovery area in stable condition. IMPRESSION: 1. Visceral angiography demonstrating two hypervascular masses in the right hepatic lobe, consistent with hepatocellular carcinoma. 2. MAA infusion into the right superior hepatic artery. 3. We will follow up shunt fraction calculations and calculate liver volumes in preparation for radioembolization therapy. Ruthann Cancer, MD [facility] Radiology Electronically Signed   By: Ruthann Cancer M.D.   On: 11/07/2021 13:52    Microbiology: No results found for this or any previous visit (from the past 240 hour(s)).   Labs: Basic Metabolic Panel: Recent Labs  Lab 11/12/21 0850 11/13/21 0351 11/14/21 0407 11/15/21 0451  NA 136 136 134* 131*  K 4.2 4.1 4.0 3.8  CL 93* 96* 92* 94*  CO2 17* 19* 26 25  GLUCOSE 87 95 85 88  BUN 59* 67* 29* 40*  CREATININE 10.25* 12.18* 6.78* 8.48*  CALCIUM 8.5* 8.3* 8.6* 8.4*  MG  --  2.0  --   --   PHOS  --   --   --  6.0*   Liver  Function Tests: Recent Labs  Lab 11/12/21 0850 11/13/21 0351 11/14/21 0407 11/15/21 0451  AST 47* 44* 47*  --   ALT '17 16 18  '$ --   ALKPHOS 141* 122 129*  --   BILITOT 1.2 0.7 1.1  --   PROT 8.2* 8.1 8.3*  --   ALBUMIN 2.5* 2.4* 2.5* 2.3*   No results for input(s): "LIPASE", "AMYLASE" in the last 168 hours. No results for input(s): "AMMONIA" in the last 168 hours. CBC: Recent Labs  Lab 11/13/21 0351 11/13/21 1044 11/13/21 2012 11/14/21 0407 11/14/21 1642 11/15/21 0451  WBC 4.0  --  3.6* 3.8* 3.4* 3.6*  HGB 6.7* 7.6* 7.8* 7.9* 7.6* 7.3*  HCT 19.4* 22.8* 23.0* 23.3* 22.0* 22.0*  MCV 98.5  --  94.7 94.7 95.2 98.2  PLT 108*  --  103* 114* 111* 97*   Cardiac Enzymes: No results for input(s): "CKTOTAL", "CKMB", "CKMBINDEX", "TROPONINI" in the last 168 hours. BNP: BNP (last 3 results) Recent Labs    01/06/21 1804 06/14/21 0356 10/08/21 1201  BNP 193.9* 521.8* 1,688.9*    ProBNP (last 3 results) No results for input(s): "PROBNP" in the last 8760 hours.  CBG: No results for input(s): "GLUCAP" in the last 168 hours.     Signed:  Nita Sells MD   [facility] 11/15/2021, 8:41 AM

## 2021-11-15 NOTE — Plan of Care (Signed)
°  Problem: Education: °Goal: Understanding of CV disease, CV risk reduction, and recovery process will improve °Outcome: Adequate for Discharge °Goal: Individualized Educational Video(s) °Outcome: Adequate for Discharge °  °Problem: Activity: °Goal: Ability to return to baseline activity level will improve °Outcome: Adequate for Discharge °  °Problem: Cardiovascular: °Goal: Ability to achieve and maintain adequate cardiovascular perfusion will improve °Outcome: Adequate for Discharge °Goal: Vascular access site(s) Level 0-1 will be maintained °Outcome: Adequate for Discharge °  °Problem: Health Behavior/Discharge Planning: °Goal: Ability to safely manage health-related needs after discharge will improve °Outcome: Adequate for Discharge °  °Problem: Education: °Goal: Knowledge of General Education information will improve °Description: Including pain rating scale, medication(s)/side effects and non-pharmacologic comfort measures °Outcome: Adequate for Discharge °  °Problem: Health Behavior/Discharge Planning: °Goal: Ability to manage health-related needs will improve °Outcome: Adequate for Discharge °  °Problem: Clinical Measurements: °Goal: Ability to maintain clinical measurements within normal limits will improve °Outcome: Adequate for Discharge °Goal: Will remain free from infection °Outcome: Adequate for Discharge °Goal: Diagnostic test results will improve °Outcome: Adequate for Discharge °Goal: Respiratory complications will improve °Outcome: Adequate for Discharge °Goal: Cardiovascular complication will be avoided °Outcome: Adequate for Discharge °  °Problem: Activity: °Goal: Risk for activity intolerance will decrease °Outcome: Adequate for Discharge °  °Problem: Nutrition: °Goal: Adequate nutrition will be maintained °Outcome: Adequate for Discharge °  °Problem: Coping: °Goal: Level of anxiety will decrease °Outcome: Adequate for Discharge °  °Problem: Elimination: °Goal: Will not experience complications  related to bowel motility °Outcome: Adequate for Discharge °Goal: Will not experience complications related to urinary retention °Outcome: Adequate for Discharge °  °Problem: Pain Managment: °Goal: General experience of comfort will improve °Outcome: Adequate for Discharge °  °Problem: Safety: °Goal: Ability to remain free from injury will improve °Outcome: Adequate for Discharge °  °Problem: Skin Integrity: °Goal: Risk for impaired skin integrity will decrease °Outcome: Adequate for Discharge °  °

## 2021-11-16 ENCOUNTER — Telehealth: Payer: Self-pay | Admitting: Nurse Practitioner

## 2021-11-16 NOTE — Telephone Encounter (Signed)
Transition of care contact from inpatient facility  Date of discharge: 11/15/2021 Date of contact: 11/16/2021 Method: Phone Spoke to: Patient  Patient contacted to discuss transition of care from recent inpatient hospitalization. Patient was admitted to [facility] from 07/18-07/21/2023 with discharge diagnosis of GIB.  Medication changes were reviewed.  Patient will follow up with his/her outpatient HD unit on: Patient didn't go to HD unit as directed 11/15/2021. Says "He didn't feel like it".

## 2021-11-17 ENCOUNTER — Encounter (HOSPITAL_COMMUNITY): Payer: Self-pay | Admitting: Gastroenterology

## 2021-11-18 ENCOUNTER — Other Ambulatory Visit: Payer: Self-pay

## 2021-11-18 ENCOUNTER — Other Ambulatory Visit: Payer: Self-pay | Admitting: *Deleted

## 2021-11-18 ENCOUNTER — Encounter (INDEPENDENT_AMBULATORY_CARE_PROVIDER_SITE_OTHER): Payer: Self-pay | Admitting: Primary Care

## 2021-11-18 ENCOUNTER — Other Ambulatory Visit: Payer: Self-pay | Admitting: Interventional Radiology

## 2021-11-18 DIAGNOSIS — C220 Liver cell carcinoma: Secondary | ICD-10-CM

## 2021-11-18 NOTE — Patient Outreach (Signed)
  Care Coordination   Follow Up Visit Note   11/18/2021 Name: Daniel [name] MRN: [id number] DOB: [birth year]  Daniel [name] is a 61 y.o. year old male who sees [name], NP for primary care. I spoke with  Daniel [name] by phone today  Pt was admitted on 7/18-7/21/23 for return of GI bleeding. He had an endoscopy and to bleeding site was discovered. It appears he did not require a transfusion at the time. Today he reports that he has to return to the hospital tomorrow because his bleeding continues. He is unsure why he has to go back.  What matters to the patients health and wellness today?  "I am still losing blood and have to go to the hospital tomorrow."  SDOH assessments and interventions completed:   Yes, Working on housing solution. App sent to [facility].  Care Coordination Interventions Activated:  Yes PATIENT MADE IT PAST THE 30 DAY READMISION DATE.  Care Coordination Interventions:  Yes, provided  Follow up plan: Follow up call scheduled for 1 week.  Encounter Outcome:  Pt. Visit Completed.  [name], MSN, [facility] Gerontological Nurse Practitioner [facility] Care Management [phone]

## 2021-11-18 NOTE — Progress Notes (Deleted)
HISTORY AND PHYSICAL     CC:  follow up. Requesting Provider:  [name], NP  HPI: This is a 61 y.o. male who is here today for follow up for PAD.  Pt has hx of  He presented with bilateral foot pain in early December.  He underwent aortogram on 04/08/2021 and he did not have any revascularization options.  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    He had poor wound healing of the left below the knee amputation and a CT scan demonstrated no concerns for abscess and no bony involvement.  He underwent a washout and debridement on 06/17/2021 and placement of a VAC.     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 Scot Dock on 04/19/2021.    We have subsequently been following his RLE arterial disease. He presented with rest pain. On 07/08/21 he underwent Aortogram, RLE arteriogram with SFA angioplasty and stenting by Dr. Stanford Breed. He was discharged on Aspirin, Plavix and high intensity Statin. Unfortunately he had presented to ED with GI bleed in early April and Dr. Carlis Abbott cleared him to discontinue Plavix at that time. Recommended continuation of Aspirin for stent patency. He was last seen in our office in April 2023 and at that time, he was having continued tingling pain in the right foot that was worse at night but also occurred during the day.  He was not ambulating so he was not having claudication.  He did not have any tissue loss.  He was still smoking.  He was not taking asa or statin.   The pt returns today for follow up.  ***  The pt is on a statin for cholesterol management.    The pt is not on an aspirin.    Other AC:  none The pt is on CCB for hypertension.  The pt does not have diabetes. Tobacco hx:  ***    Past Medical History:  Diagnosis Date   Anemia    Diabetes mellitus without complication [facility])    patient  denies   Dialysis patient [facility])    End stage chronic kidney disease ([place])    Hypertension    ICH (intracerebral hemorrhage) ([place]) 05/20/2017   PAD (peripheral artery disease) ([facility])    Shoulder pain, left 06/28/2013    Past Surgical History:  Procedure Laterality Date   A/V FISTULAGRAM N/A 08/15/2020   Procedure: A/V FISTULAGRAM - Left Upper;  Surgeon: Cherre Robins, MD;  Location: [place] [facility];  Service: Cardiovascular;  Laterality: N/A;   ABDOMINAL AORTOGRAM W/LOWER EXTREMITY N/A 04/08/2021   Procedure: ABDOMINAL AORTOGRAM W/LOWER EXTREMITY;  Surgeon: Waynetta Sandy, MD;  Location: [place] [facility];  Service: Cardiovascular;  Laterality: N/A;   AMPUTATION Left 04/16/2021   Procedure: LEFT BELOW KNEE AMPUTATION;  Surgeon: Angelia Mould, MD;  Location: [place];  Service: Vascular;  Laterality: Left;   AMPUTATION Left 06/17/2021   Procedure: REVISION AMPUTATION BELOW KNEE;  Surgeon: Broadus John, MD;  Location: [place];  Service: Vascular;  Laterality: Left;   APPENDECTOMY     APPLICATION OF WOUND VAC Left 06/17/2021   Procedure: APPLICATION OF WOUND VAC;  Surgeon: Broadus John, MD;  Location: [place];  Service: Vascular;  Laterality: Left;   AV FISTULA PLACEMENT Left 08/03/2019   Procedure: LEFT ARM  ARTERIOVENOUS (AV) CEPHALIC  FISTULA CREATION;  Surgeon: Waynetta Sandy, MD;  Location: [place];  Service: Vascular;  Laterality: Left;   BIOPSY  06/30/2019   Procedure: BIOPSY;  Surgeon: Ronald Lobo, MD;  Location: [facility] ENDOSCOPY;  Service: Endoscopy;;   BIOPSY  08/02/2019   Procedure: BIOPSY;  Surgeon: Yetta Flock, MD;  Location: [facility] ENDOSCOPY;  Service: Gastroenterology;;   BIOPSY  02/08/2021   Procedure: BIOPSY;  Surgeon: Sharyn Creamer, MD;  Location: [facility] ENDOSCOPY;  Service: Gastroenterology;;   BIOPSY  02/24/2021   Procedure: BIOPSY;  Surgeon: [name], MD;  Location: [facility] ENDOSCOPY;  Service: Gastroenterology;;   BIOPSY  03/26/2021    Procedure: BIOPSY;  Surgeon: Lavena Bullion, DO;  Location: [place];  Service: Gastroenterology;;   BIOPSY  08/06/2021   Procedure: BIOPSY;  Surgeon: [name], MD;  Location: [place];  Service: Gastroenterology;;   BIOPSY  09/15/2021   Procedure: BIOPSY;  Surgeon: Sharyn Creamer, MD;  Location: [place];  Service: Gastroenterology;;   COLONOSCOPY  01/23/2012   Procedure: COLONOSCOPY;  Surgeon: Danie Binder, MD;  [facility];  Service: Endoscopy;  Laterality: N/A;  11:10 AM   COLONOSCOPY WITH PROPOFOL N/A 06/30/2019   Procedure: COLONOSCOPY WITH PROPOFOL;  Surgeon: Ronald Lobo, MD;  Location: [place];  Service: Endoscopy;  Laterality: N/A;   ENTEROSCOPY N/A 08/02/2019   Procedure: ENTEROSCOPY;  Surgeon: Yetta Flock, MD;  Location: [facility] ENDOSCOPY;  Service: Gastroenterology;  Laterality: N/A;   ENTEROSCOPY N/A 02/24/2021   Procedure: ENTEROSCOPY;  Surgeon: [name], MD;  Location: [facility] ENDOSCOPY;  Service: Gastroenterology;  Laterality: N/A;   ENTEROSCOPY N/A 03/26/2021   Procedure: ENTEROSCOPY;  Surgeon: Lavena Bullion, DO;  Location: [place];  Service: Gastroenterology;  Laterality: N/A;   ENTEROSCOPY N/A 07/05/2021   Procedure: ENTEROSCOPY;  Surgeon: Carol Ada, MD;  Location: [place];  Service: Gastroenterology;  Laterality: N/A;   ENTEROSCOPY N/A 08/06/2021   Procedure: ENTEROSCOPY;  Surgeon: [name], MD;  Location: [facility] ENDOSCOPY;  Service: Gastroenterology;  Laterality: N/A;   ENTEROSCOPY N/A 08/24/2021   Procedure: ENTEROSCOPY;  Surgeon: Ladene Artist, MD;  Location: [facility] ENDOSCOPY;  Service: Gastroenterology;  Laterality: N/A;   ENTEROSCOPY N/A 09/15/2021   Procedure: ENTEROSCOPY;  Surgeon: Sharyn Creamer, MD;  Location: [facility] ENDOSCOPY;  Service: Gastroenterology;  Laterality: N/A;   ENTEROSCOPY N/A 10/06/2021   Procedure: ENTEROSCOPY;  Surgeon: [name], MD;  Location: [facility] ENDOSCOPY;   Service: Gastroenterology;  Laterality: N/A;   ENTEROSCOPY N/A 11/14/2021   Procedure: ENTEROSCOPY;  Surgeon: Doran Stabler, MD;  Location: [facility] ENDOSCOPY;  Service: Gastroenterology;  Laterality: N/A;   ESOPHAGOGASTRODUODENOSCOPY N/A 08/10/2020   Procedure: ESOPHAGOGASTRODUODENOSCOPY (EGD);  Surgeon: Jerene Bears, MD;  Location: [facility] ENDOSCOPY;  Service: Gastroenterology;  Laterality: N/A;   ESOPHAGOGASTRODUODENOSCOPY (EGD) WITH PROPOFOL N/A 06/30/2019   Procedure: ESOPHAGOGASTRODUODENOSCOPY (EGD) WITH PROPOFOL;  Surgeon: Ronald Lobo, MD;  Location: [place];  Service: Endoscopy;  Laterality: N/A;   ESOPHAGOGASTRODUODENOSCOPY (EGD) WITH PROPOFOL N/A 01/12/2021   Procedure: ESOPHAGOGASTRODUODENOSCOPY (EGD) WITH PROPOFOL;  Surgeon: Sharyn Creamer, MD;  Location: [place];  Service: Gastroenterology;  Laterality: N/A;   ESOPHAGOGASTRODUODENOSCOPY (EGD) WITH PROPOFOL N/A 02/08/2021   Procedure: ESOPHAGOGASTRODUODENOSCOPY (EGD) WITH PROPOFOL;  Surgeon: Sharyn Creamer, MD;  Location: [place];  Service: Gastroenterology;  Laterality: N/A;   FISTULA SUPERFICIALIZATION Left 10/17/2019   Procedure: LEFT UPPER EXTREMITY FISTULA REVISION, SIDE BRANCH LIGATION,  AND SUPERFICIALIZATION;  Surgeon: Carlis Abbott,  Gwenyth Allegra, MD;  Location: [place];  Service: Vascular;  Laterality: Left;   FISTULA SUPERFICIALIZATION Left 38/18/2993   Procedure: PLICATION OF ANEURYSM LEFT ARTERIOVENOUS FISTULA;  Surgeon: Angelia Mould, MD;  Location: [place];  Service: Vascular;  Laterality: Left;   FLEXIBLE SIGMOIDOSCOPY N/A 07/05/2021   Procedure: FLEXIBLE SIGMOIDOSCOPY;  Surgeon: Carol Ada, MD;  Location: [place];  Service: Gastroenterology;  Laterality: N/A;   GIVENS CAPSULE STUDY N/A 06/30/2019   Procedure: GIVENS CAPSULE STUDY;  Surgeon: Ronald Lobo, MD;  Location: [place];  Service: Endoscopy;  Laterality: N/A;   GIVENS CAPSULE STUDY N/A 08/25/2021   Procedure: GIVENS CAPSULE STUDY;  Surgeon:  Ladene Artist, MD;  Location: [place];  Service: Gastroenterology;  Laterality: N/A;   GIVENS CAPSULE STUDY N/A 10/06/2021   Procedure: GIVENS CAPSULE STUDY;  Surgeon: [name], MD;  Location: [place];  Service: Gastroenterology;  Laterality: N/A;   HEMOSTASIS CLIP PLACEMENT  08/10/2020   Procedure: HEMOSTASIS CLIP PLACEMENT;  Surgeon: Jerene Bears, MD;  Location: [place];  Service: Gastroenterology;;   HEMOSTASIS CLIP PLACEMENT  01/12/2021   Procedure: HEMOSTASIS CLIP PLACEMENT;  Surgeon: Sharyn Creamer, MD;  Location: [place];  Service: Gastroenterology;;   HEMOSTASIS CONTROL  08/02/2019   Procedure: HEMOSTASIS CONTROL;  Surgeon: Yetta Flock, MD;  Location: [place];  Service: Gastroenterology;;   HOT HEMOSTASIS  02/24/2021   Procedure: HOT HEMOSTASIS (ARGON PLASMA COAGULATION/BICAP);  Surgeon: [name], MD;  Location: [facility] ENDOSCOPY;  Service: Gastroenterology;;   HOT HEMOSTASIS N/A 03/26/2021   Procedure: HOT HEMOSTASIS (ARGON PLASMA COAGULATION/BICAP);  Surgeon: Lavena Bullion, DO;  Location: [facility] ENDOSCOPY;  Service: Gastroenterology;  Laterality: N/A;   HOT HEMOSTASIS N/A 07/05/2021   Procedure: HOT HEMOSTASIS (ARGON PLASMA COAGULATION/BICAP);  Surgeon: Carol Ada, MD;  Location: [place];  Service: Gastroenterology;  Laterality: N/A;   HOT HEMOSTASIS N/A 09/15/2021   Procedure: HOT HEMOSTASIS (ARGON PLASMA COAGULATION/BICAP);  Surgeon: Sharyn Creamer, MD;  Location: [place];  Service: Gastroenterology;  Laterality: N/A;   HOT HEMOSTASIS N/A 10/06/2021   Procedure: HOT HEMOSTASIS (ARGON PLASMA COAGULATION/BICAP);  Surgeon: [name], MD;  Location: [place];  Service: Gastroenterology;  Laterality: N/A;   INCISION AND DRAINAGE ABSCESS N/A 06/29/2016   Procedure: INCISION AND DRAINAGE ABDOMINAL WALL ABSCESS;  Surgeon: Alphonsa Overall, MD;  Location: [facility];  Service: General;  Laterality: N/A;   INSERTION OF DIALYSIS  CATHETER Right 08/03/2019   Procedure: INSERTION OF DIALYSIS CATHETER;  Surgeon: Waynetta Sandy, MD;  Location: [place];  Service: Vascular;  Laterality: Right;   INSERTION OF DIALYSIS CATHETER Right 10/22/2019   Procedure: INSERTION OF 23CM TUNNELED DIALYSIS CATHETER RIGHT INTERNAL JUGULAR;  Surgeon: Angelia Mould, MD;  Location: [place];  Service: Vascular;  Laterality: Right;   INSERTION OF DIALYSIS CATHETER Right 08/12/2020   Procedure: INSERTION OF Right internal Jugular TUNNELED  DIALYSIS CATHETER.;  Surgeon: Waynetta Sandy, MD;  Location: [place];  Service: Vascular;  Laterality: Right;   INSERTION OF DIALYSIS CATHETER N/A 04/19/2021   Procedure: INSERTION OF TUNNELED DIALYSIS CATHETER;  Surgeon: Angelia Mould, MD;  Location: [place];  Service: Vascular;  Laterality: N/A;   IR 3D INDEPENDENT WKST  11/07/2021   IR ANGIOGRAM SELECTIVE EACH ADDITIONAL VESSEL  11/07/2021   IR ANGIOGRAM SELECTIVE EACH ADDITIONAL VESSEL  11/07/2021   IR ANGIOGRAM SELECTIVE EACH ADDITIONAL VESSEL  11/07/2021   IR ANGIOGRAM VISCERAL SELECTIVE  11/07/2021   IR EMBO ARTERIAL NOT HEMORR  HEMANG INC GUIDE ROADMAPPING  11/07/2021   IR RADIOLOGIST EVAL & MGMT  09/12/2021   IR US GUIDE VASC ACCESS LEFT  11/07/2021   Left heel surgery     LOWER EXTREMITY ANGIOGRAPHY N/A 07/08/2021   Procedure: Lower Extremity Angiography;  Surgeon: Cherre Robins, MD;  Location: [place] [facility];  Service: Cardiovascular;  Laterality: N/A;   PENILE BIOPSY N/A 03/26/2020   Procedure: PENILE ULCER DEBRIDEMENT;  Surgeon: Remi Haggard, MD;  Location: [facility];  Service: Urology;  Laterality: N/A;  30 MINS   PERIPHERAL VASCULAR INTERVENTION Right 07/08/2021   Procedure: PERIPHERAL VASCULAR INTERVENTION;  Surgeon: Cherre Robins, MD;  Location: [place] [facility];  Service: Cardiovascular;  Laterality: Right;   SCLEROTHERAPY  01/12/2021   Procedure: SCLEROTHERAPY;  Surgeon: Sharyn Creamer, MD;  Location[facility]  ENDOSCOPY;  Service: Gastroenterology;;   Clide Deutscher  02/24/2021   Procedure: Clide Deutscher;  Surgeon: [name], MD;  Location: [place];  Service: Gastroenterology;;   SUBMUCOSAL TATTOO INJECTION  08/24/2021   Procedure: SUBMUCOSAL TATTOO INJECTION;  Surgeon: Ladene Artist, MD;  Location: [place];  Service: Gastroenterology;;   SUBMUCOSAL TATTOO INJECTION  09/15/2021   Procedure: SUBMUCOSAL TATTOO INJECTION;  Surgeon: Sharyn Creamer, MD;  Location: [facility] ENDOSCOPY;  Service: Gastroenterology;;    Allergies  Allergen Reactions   Seroquel [Quetiapine] Other (See Comments)    Tardive kinesia Dystonia    Dilaudid [Hydromorphone Hcl] Itching and Other (See Comments)    Pt reports itchiness after IM injection     Current Outpatient Medications  Medication Sig Dispense Refill   amLODipine (NORVASC) 5 MG tablet Take 1 tablet (5 mg total) by mouth daily. 30 tablet 2   atorvastatin (LIPITOR) 40 MG tablet Take 1 tablet (40 mg total) by mouth daily. 30 tablet 2   gabapentin (NEURONTIN) 300 MG capsule Take 1 capsule (300 mg total) by mouth at bedtime. 90 capsule 3   pantoprazole (PROTONIX) 40 MG tablet Take 1 tablet (40 mg total) by mouth 2 (two) times daily before a meal. 60 tablet 2   traZODone (DESYREL) 50 MG tablet Take 1 tablet (50 mg total) by mouth at bedtime as needed for sleep. 90 tablet 1   No current facility-administered medications for this visit.    Family History  Problem Relation Age of Onset   Colon cancer Neg Hx     Social History   Socioeconomic History   Marital status: Single    Spouse name: Not on file   Number of children: 2   Years of education: 15   Highest education level: 12th grade  Occupational History   Occupation: disabled  Tobacco Use   Smoking status: Every Day    Packs/day: 0.50    Years: 45.00    Total pack years: 22.50    Types: Cigarettes   Smokeless tobacco: Never   Tobacco comments:    Pt left before information given   Vaping Use   Vaping Use: Never used  Substance and Sexual Activity   Alcohol use: Not Currently   Drug use: Not Currently    Types: "Crack" cocaine    Comment: last in 2020   Sexual activity: Yes    Birth control/protection: None  Other Topics Concern   Not on file  Social History Narrative   Goes to dialysis M-W-F, LBKA using a wheelchair at present, waiting for prosthetic. Looking for handicapped housing for immediate move in.   Social Determinants of Health   Financial  Resource Strain: High Risk (11/01/2021)   Overall Financial Resource Strain (CARDIA)    Difficulty of Paying Living Expenses: Hard  Food Insecurity: No Food Insecurity (11/01/2021)   Hunger Vital Sign    Worried About Running Out of Food in the Last Year: Never true    Ran Out of Food in the Last Year: Never true  Transportation Needs: No Transportation Needs (11/01/2021)   PRAPARE - Hydrologist (Medical): No    Lack of Transportation (Non-Medical): No  Physical Activity: Insufficiently Active (11/01/2021)   Exercise Vital Sign    Days of Exercise per Week: 1 day    Minutes of Exercise per Session: 20 min  Stress: No Stress Concern Present (11/01/2021)   [place]    Feeling of Stress : Not at all  Social Connections: Socially Isolated (11/01/2021)   Social Connection and Isolation Panel [NHANES]    Frequency of Communication with Friends and Family: More than three times a week    Frequency of Social Gatherings with Friends and Family: More than three times a week    Attends Religious Services: Never    Marine scientist or Organizations: No    Attends Archivist Meetings: Never    Marital Status: Divorced  Human resources officer Violence: Not At Risk (11/01/2021)   Humiliation, Afraid, Rape, and Kick questionnaire    Fear of Current or Ex-Partner: No    Emotionally Abused: No    Physically Abused: No     Sexually Abused: No     REVIEW OF SYSTEMS:  *** '[X]'$  denotes positive finding, '[ ]'$  denotes negative finding Cardiac  Comments:  Chest pain or chest pressure:    Shortness of breath upon exertion:    Short of breath when lying flat:    Irregular heart rhythm:        Vascular    Pain in calf, thigh, or hip brought on by ambulation:    Pain in feet at night that wakes you up from your sleep:     Blood clot in your veins:    Leg swelling:         Pulmonary    Oxygen at home:    Productive cough:     Wheezing:         Neurologic    Sudden weakness in arms or legs:     Sudden numbness in arms or legs:     Sudden onset of difficulty speaking or slurred speech:    Temporary loss of vision in one eye:     Problems with dizziness:         Gastrointestinal    Blood in stool:     Vomited blood:         Genitourinary    Burning when urinating:     Blood in urine:        Psychiatric    Major depression:         Hematologic    Bleeding problems:    Problems with blood clotting too easily:        Skin    Rashes or ulcers:        Constitutional    Fever or chills:      PHYSICAL EXAMINATION:  ***  General:  WDWN in NAD; vital signs documented above Gait: Not observed HENT: WNL, normocephalic Pulmonary: normal non-labored breathing , without wheezing Cardiac: {Desc; regular/irreg:14544} HR, {With/Without:20273} carotid bruit*** Abdomen:  soft, NT, no masses; aortic pulse is *** palpable Skin: {With/Without:20273} rashes Vascular Exam/Pulses:  Right Left  Radial {Exam; arterial pulse strength 0-4:30167} {Exam; arterial pulse strength 0-4:30167}  Femoral {Exam; arterial pulse strength 0-4:30167} {Exam; arterial pulse strength 0-4:30167}  Popliteal {Exam; arterial pulse strength 0-4:30167} {Exam; arterial pulse strength 0-4:30167}  DP {Exam; arterial pulse strength 0-4:30167} {Exam; arterial pulse strength 0-4:30167}  PT {Exam; arterial pulse strength 0-4:30167} {Exam;  arterial pulse strength 0-4:30167}  Peroneal *** ***   Extremities: {With/Without:20273} ischemic changes, {With/Without:20273} Gangrene , {With/Without:20273} cellulitis; {With/Without:20273} open wounds Musculoskeletal: no muscle wasting or atrophy  Neurologic: A&O X 3 Psychiatric:  The pt has {Desc; normal/abnormal:11317::"Normal"} affect.   Non-Invasive Vascular Imaging:   ABI's/TBI's on 11/19/2021: Right:  *** - Great toe pressure: *** Left:  *** - Great toe pressure: ***  Arterial duplex on 11/19/2021: ***  Previous ABI's/TBI's on 08/20/2021: Right:  [place] - Great toe pressure: 100 Left:  BKA   Previous arterial duplex on 08/20/2021: Right: Widely patent superficial femoral artery stent without hemodynamically significant stenosis.    ASSESSMENT/PLAN:: 61 y.o. male here for follow up for PAD with hx of left BKA and hx of right lower extremity PAD with SFA angioplasty and stenting by Dr. Stanford Breed on 07/08/2021  -*** -continue *** -pt will f/u in *** with ***.   Leontine Locket, [facility] [phone]  Clinic MD:   Carlis Abbott

## 2021-11-19 ENCOUNTER — Ambulatory Visit: Payer: Medicare Other

## 2021-11-19 ENCOUNTER — Encounter (HOSPITAL_COMMUNITY): Payer: Medicare Other

## 2021-11-23 ENCOUNTER — Emergency Department (HOSPITAL_COMMUNITY)
Admission: EM | Admit: 2021-11-23 | Discharge: 2021-11-23 | Disposition: A | Discharge status: Home / Self Care | Payer: Medicare Other | Attending: Emergency Medicine | Admitting: Emergency Medicine

## 2021-11-23 ENCOUNTER — Emergency Department (HOSPITAL_COMMUNITY): Payer: Medicare Other

## 2021-11-23 ENCOUNTER — Other Ambulatory Visit: Payer: Self-pay

## 2021-11-23 DIAGNOSIS — R2243 Localized swelling, mass and lump, lower limb, bilateral: Secondary | ICD-10-CM | POA: Insufficient documentation

## 2021-11-23 DIAGNOSIS — Z79899 Other long term (current) drug therapy: Secondary | ICD-10-CM | POA: Diagnosis not present

## 2021-11-23 DIAGNOSIS — J810 Acute pulmonary edema: Secondary | ICD-10-CM | POA: Diagnosis not present

## 2021-11-23 DIAGNOSIS — I120 Hypertensive chronic kidney disease with stage 5 chronic kidney disease or end stage renal disease: Secondary | ICD-10-CM | POA: Insufficient documentation

## 2021-11-23 DIAGNOSIS — Z992 Dependence on renal dialysis: Secondary | ICD-10-CM | POA: Diagnosis not present

## 2021-11-23 DIAGNOSIS — R0602 Shortness of breath: Secondary | ICD-10-CM | POA: Diagnosis present

## 2021-11-23 DIAGNOSIS — N186 End stage renal disease: Secondary | ICD-10-CM | POA: Diagnosis not present

## 2021-11-23 DIAGNOSIS — E1122 Type 2 diabetes mellitus with diabetic chronic kidney disease: Secondary | ICD-10-CM | POA: Diagnosis not present

## 2021-11-23 LAB — CBC
HCT: 22.3 % — ABNORMAL LOW (ref 39.0–52.0)
Hemoglobin: 7.2 g/dL — ABNORMAL LOW (ref 13.0–17.0)
MCH: 32.9 pg (ref 26.0–34.0)
MCHC: 32.3 g/dL (ref 30.0–36.0)
MCV: 101.8 fL — ABNORMAL HIGH (ref 80.0–100.0)
Platelets: 116 10*3/uL — ABNORMAL LOW (ref 150–400)
RBC: 2.19 MIL/uL — ABNORMAL LOW (ref 4.22–5.81)
RDW: 17.7 % — ABNORMAL HIGH (ref 11.5–15.5)
WBC: 4.2 10*3/uL (ref 4.0–10.5)
nRBC: 0 % (ref 0.0–0.2)

## 2021-11-23 LAB — TROPONIN I (HIGH SENSITIVITY)
Troponin I (High Sensitivity): 14 ng/L (ref <18)
Troponin I (High Sensitivity): 16 ng/L (ref <18)

## 2021-11-23 LAB — BASIC METABOLIC PANEL
Anion gap: 17 — ABNORMAL HIGH (ref 5–15)
BUN: 59 mg/dL — ABNORMAL HIGH (ref 8–23)
CO2: 19 mmol/L — ABNORMAL LOW (ref 22–32)
Calcium: 8.7 mg/dL — ABNORMAL LOW (ref 8.9–10.3)
Chloride: 101 mmol/L (ref 98–111)
Creatinine, Ser: 11.59 mg/dL — ABNORMAL HIGH (ref 0.61–1.24)
GFR, Estimated: 4 mL/min — ABNORMAL LOW (ref >60)
Glucose, Bld: 91 mg/dL (ref 70–99)
Potassium: 4.7 mmol/L (ref 3.5–5.1)
Sodium: 137 mmol/L (ref 135–145)

## 2021-11-23 MED ORDER — PENTAFLUOROPROP-TETRAFLUOROETH EX AERO: 1.0000 | INHALATION_SPRAY | CUTANEOUS | Status: DC | PRN

## 2021-11-23 MED ORDER — LIDOCAINE HCL (PF) 1 % IJ SOLN: 5.0000 mL | INTRAMUSCULAR | Status: DC | PRN

## 2021-11-23 MED ORDER — ACETAMINOPHEN 325 MG PO TABS
650.0000 mg | ORAL_TABLET | Freq: Once | ORAL | Status: AC
  Administered 2021-11-23: 650 mg via ORAL
  Filled 2021-11-23: qty 2

## 2021-11-23 MED ORDER — LIDOCAINE-PRILOCAINE 2.5-2.5 % EX CREA: 1.0000 | TOPICAL_CREAM | CUTANEOUS | Status: DC | PRN

## 2021-11-23 MED ORDER — ALTEPLASE 2 MG IJ SOLR: 2.0000 mg | Freq: Once | INTRAMUSCULAR | Status: DC | PRN

## 2021-11-23 MED ORDER — HEPARIN SODIUM (PORCINE) 1000 UNIT/ML DIALYSIS
1000.0000 [IU] | INTRAMUSCULAR | Status: DC | PRN
  Filled 2021-11-23: qty 1

## 2021-11-23 MED ORDER — CHLORHEXIDINE GLUCONATE CLOTH 2 % EX PADS: 6.0000 | MEDICATED_PAD | Freq: Every day | CUTANEOUS | Status: DC

## 2021-11-23 NOTE — ED Provider Notes (Signed)
[facility] EMERGENCY DEPARTMENT Provider Note   CSN: [id number] Arrival date & time: 11/23/21  0272     History  Chief Complaint  Patient presents with   Shortness of Breath   Chest Pain    Daniel [name] is a 61 y.o. male.  Daniel [name] is a 61 y.o. 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  Patient reports that chest pain and shortness of breath started yesterday.  He describes chest pain as a central chest pressure and reports he has become increasingly short of breath.  He reports shortness of breath is worse when trying to lay flat.  He has also had worsening swelling in his lower extremities.  He reports that he has not had dialysis since Monday and missed his sessions on Wednesday and Friday because he could not put gas in his car to get to his appointments and did not have alternative transportation.  The history is provided by the patient.  Chest Pain Associated symptoms: shortness of breath   Associated symptoms: no abdominal pain, no fever, no nausea and no vomiting        Home Medications Prior to Admission medications   Medication Sig Start Date End Date Taking? Authorizing Provider  amLODipine (NORVASC) 5 MG tablet Take 1 tablet (5 mg total) by mouth daily. 09/18/21 12/17/21  [place] Donnamarie Poag, DO  atorvastatin (LIPITOR) 40 MG tablet Take 1 tablet (40 mg total) by mouth daily. 09/18/21 12/17/21  [place] Donnamarie Poag, DO  gabapentin (NEURONTIN) 300 MG capsule Take 1 capsule (300 mg total) by mouth at bedtime. 11/15/21   Nita Sells, MD  pantoprazole (PROTONIX) 40 MG tablet Take 1 tablet (40 mg total) by mouth 2 (two) times daily before a meal. 10/10/21 01/08/22  Mercy Riding, MD  traZODone (DESYREL) 50 MG tablet Take 1 tablet (50 mg total) by mouth at bedtime as needed for sleep. 11/15/21   Nita Sells, MD   colchicine 0.6 MG tablet Take 0.5 tablets (0.3 mg total) by mouth 2 (two) times daily. 07/24/20 07/29/20  Noemi Chapel, MD  furosemide (LASIX) 40 MG tablet Take 1 tablet (40 mg total) by mouth daily. 07/25/19 02/09/20  Ladona Horns, MD      Allergies    Seroquel [quetiapine] and Dilaudid [hydromorphone hcl]    Review of Systems   Review of Systems  Constitutional:  Negative for chills and fever.  Respiratory:  Positive for shortness of breath.   Cardiovascular:  Positive for chest pain and leg swelling.  Gastrointestinal:  Negative for abdominal pain, nausea and vomiting.    Physical Exam Updated Vital Signs BP (!) 167/89   Pulse 85   Temp 98 F (36.7 C) (Oral)   Resp 18   SpO2 96%  Physical Exam Vitals and nursing note reviewed.  Constitutional:      General: He is not in acute distress.    Appearance: Normal appearance. He is well-developed. He is not ill-appearing or diaphoretic.  HENT:     Head: Normocephalic and atraumatic.  Eyes:     General:        Right eye: No discharge.        Left eye: No discharge.  Cardiovascular:     Rate and Rhythm: Normal rate and regular rhythm.  Pulmonary:     Effort: Pulmonary effort is normal. No respiratory distress.  Breath sounds: Rales present.     Comments: Patient in no acute respiratory distress, respirations are equal and unlabored, satting well on room air, but does have bibasilar rales Musculoskeletal:     Right lower leg: No tenderness. Edema present.     Left lower leg: Edema present.     Comments: Dialysis fistula in the left upper extremity with palpable thrill  Skin:    General: Skin is warm and dry.  Neurological:     Mental Status: He is alert and oriented to person, place, and time.     Coordination: Coordination normal.     Comments: Speech is clear, able to follow commands Moves extremities without ataxia, coordination intact  Psychiatric:        Mood and Affect: Mood normal.        Behavior: Behavior  normal.     ED Results / Procedures / Treatments   Labs (all labs ordered are listed, but only abnormal results are displayed) Labs Reviewed  BASIC METABOLIC PANEL - Abnormal; Notable for the following components:      Result Value   CO2 19 (*)    BUN 59 (*)    Creatinine, Ser 11.59 (*)    Calcium 8.7 (*)    GFR, Estimated 4 (*)    Anion gap 17 (*)    All other components within normal limits  CBC - Abnormal; Notable for the following components:   RBC 2.19 (*)    Hemoglobin 7.2 (*)    HCT 22.3 (*)    MCV 101.8 (*)    RDW 17.7 (*)    Platelets 116 (*)    All other components within normal limits  TROPONIN I (HIGH SENSITIVITY)  TROPONIN I (HIGH SENSITIVITY)    EKG EKG Interpretation  Date/Time:  Saturday November 23 2021 07:16:25 EDT Ventricular Rate:  86 PR Interval:  178 QRS Duration: 90 QT Interval:  390 QTC Calculation: 466 R Axis:   -7 Text Interpretation: Normal sinus rhythm When compared with ECG of 13-Nov-2021 01:04, PREVIOUS ECG IS PRESENT Confirmed by Lennice Sites ([id number]) on 11/23/2021 7:27:22 AM  Radiology DG Chest 2 View  Result Date: 11/23/2021 CLINICAL DATA:  A 61 year old male presents for evaluation of shortness of breath and chest pain for 2 days. EXAM: CHEST - 2 VIEW COMPARISON:  November 07, 2021 in November 08, 2021 FINDINGS: Signs of vascular stenting over the LEFT axilla similar to previous imaging. Trachea midline. Cardiomediastinal contours and hilar structures are stable with moderate to marked cardiomegaly. Global increased interstitial markings in the chest compared to previous imaging. Small effusions suggested on lateral projection. No sign of lobar consolidative changes. Patchy opacities in the LEFT mid and RIGHT mid chest persists as seen on previous CT imaging with increase of an overlay of interstitial prominence. IMPRESSION: Moderate to marked cardiomegaly with signs of interstitial edema and small effusions. Findings most suggestive of heart failure or  volume overload worsening since previous imaging. Persistent airspace/alveolar process in the mid chest bilaterally could reflect asymmetric edema or atypical infection in the background has been present, over a series of prior studies. Electronically Signed   By: Zetta Bills M.D.   On: 11/23/2021 08:36    Procedures Procedures    Medications Ordered in ED Medications  Chlorhexidine Gluconate Cloth 2 % PADS 6 each (6 each Topical Not Given 11/23/21 0843)  pentafluoroprop-tetrafluoroeth (GEBAUERS) aerosol 1 Application (has no administration in time range)  lidocaine (PF) (XYLOCAINE) 1 % injection 5 mL (has no  administration in time range)  lidocaine-prilocaine (EMLA) cream 1 Application (has no administration in time range)  heparin injection 1,000 Units (has no administration in time range)  alteplase (CATHFLO ACTIVASE) injection 2 mg (has no administration in time range)    ED Course/ Medical Decision Making/ A&P                           Medical Decision Making Amount and/or Complexity of Data Reviewed Labs: ordered. Radiology: ordered.   61 y.o. male presents to the ED with complaints of chest pain, shortness of breath, this involves an extensive number of treatment options, and is a complaint that carries with it a high risk of complications and morbidity.  The differential diagnosis includes ACS, CHF, pulmonary edema, PE, pneumonia.  On arrival pt is nontoxic, vitals mildly hypertensive but vitals otherwise normal. Exam significant for rales in bilateral bases all  Additional history obtained from chart review. Previous records obtained and reviewed   EKG: Normal sinus rhythm without peaked T waves or changes suggestive of hyperkalemia, no ischemic changes  Lab Tests:  I Ordered, reviewed, and interpreted labs, which included: Fortunately no hyperkalemia despite missing 2 dialysis sessions this week, creatinine of 11.59 with BUN of 59, anion gap of 17 likely in the setting  of uremia, no leukocytosis, hemoglobin of 7.2 at baseline due to chronic kidney disease, troponin negative x2.  Imaging Studies ordered:  I ordered imaging studies which included chest x-ray, I independently visualized and interpreted imaging which showed moderate to marked cardiomegaly with signs of interstitial edema and small effusions  ED Course:   Patient presents with chest pain and shortness of breath after missing 2 dialysis sessions this week due to issues with transportation.  Fortunately he is not hyperkalemic but does have rales on exam with lower extremity edema and pulmonary edema noted on chest x-ray.  Fortunately he is maintaining sats on room air but feel he would benefit from dialysis today.  Case discussed with nephrology NP Tobie Poet who has set patient up for dialysis today.  She will also speak with the dialysis coordinator and social workers to try and help patient with transportation for planned dialysis on Monday.  Dialysis team anticipates discharge after dialysis  Portions of this note were generated with Dragon dictation software. Dictation errors may occur despite best attempts at proofreading.         Final Clinical Impression(s) / ED Diagnoses Final diagnoses:  Acute pulmonary edema ([place])  ESRD on hemodialysis [facility])    Rx / DC Orders ED Discharge Orders     None         Janet Berlin 11/23/21 1531    Lennice Sites, DO 11/24/21 [phone]

## 2021-11-23 NOTE — ED Triage Notes (Signed)
MWF dialysis patient here for eval of sob and central chest pain onset yesterday after missing W and F dialysis tx. Last full tx on Monday d/t not having gas in his car to get to last two appointments. Also endorses swelling in BLE.

## 2021-11-23 NOTE — Procedures (Signed)
   I was present at this dialysis session, have reviewed the session itself and made  appropriate changes Kelly Splinter MD [place] pager [phone]   11/23/2021, 2:20 PM

## 2021-11-23 NOTE — Care Management (Signed)
Met with patient regarding transportation needs to dialysis. Patient does drive, he wishes to be clipped closer to home due to cost of gas. [place] and busses [place] come. His next check will be august 3rd. Discussed that if he can just rest the weekend and get to dialysis on Monday, that Social work will assist him to work through issues.

## 2021-11-23 NOTE — ED Provider Notes (Signed)
Received from previous provider.  See note for full HPI.  In summation 61 year old ESRD patient here for evaluation of shortness of breath.  Likely he was fluid overloaded as a cause of his symptoms.  He was sent for dialysis.  Plan once return from dialysis to discharge home symptoms improved Physical Exam  BP (!) 177/97 (BP Location: Right Arm)   Pulse 88   Temp 98.3 F (36.8 C) (Oral)   Resp 20   SpO2 98%   Physical Exam Vitals and nursing note reviewed.  Constitutional:      General: He is not in acute distress.    Appearance: He is well-developed. He is not ill-appearing or diaphoretic.  HENT:     Head: Atraumatic.  Eyes:     Pupils: Pupils are equal, round, and reactive to light.  Cardiovascular:     Rate and Rhythm: Normal rate and regular rhythm.  Pulmonary:     Effort: Pulmonary effort is normal. No respiratory distress.  Abdominal:     General: There is no distension.     Palpations: Abdomen is soft.  Musculoskeletal:        General: Normal range of motion.     Cervical back: Normal range of motion and neck supple.  Skin:    General: Skin is warm and dry.  Neurological:     General: No focal deficit present.     Mental Status: He is alert and oriented to person, place, and time.     Procedures  Procedures Labs Reviewed  BASIC METABOLIC PANEL - Abnormal; Notable for the following components:      Result Value   CO2 19 (*)    BUN 59 (*)    Creatinine, Ser 11.59 (*)    Calcium 8.7 (*)    GFR, Estimated 4 (*)    Anion gap 17 (*)    All other components within normal limits  CBC - Abnormal; Notable for the following components:   RBC 2.19 (*)    Hemoglobin 7.2 (*)    HCT 22.3 (*)    MCV 101.8 (*)    RDW 17.7 (*)    Platelets 116 (*)    All other components within normal limits  CBC  TROPONIN I (HIGH SENSITIVITY)  TROPONIN I (HIGH SENSITIVITY)   DG Chest 2 View  Result Date: 11/23/2021 CLINICAL DATA:  A 61 year old male presents for evaluation of  shortness of breath and chest pain for 2 days. EXAM: CHEST - 2 VIEW COMPARISON:  November 07, 2021 in November 08, 2021 FINDINGS: Signs of vascular stenting over the LEFT axilla similar to previous imaging. Trachea midline. Cardiomediastinal contours and hilar structures are stable with moderate to marked cardiomegaly. Global increased interstitial markings in the chest compared to previous imaging. Small effusions suggested on lateral projection. No sign of lobar consolidative changes. Patchy opacities in the LEFT mid and RIGHT mid chest persists as seen on previous CT imaging with increase of an overlay of interstitial prominence. IMPRESSION: Moderate to marked cardiomegaly with signs of interstitial edema and small effusions. Findings most suggestive of heart failure or volume overload worsening since previous imaging. Persistent airspace/alveolar process in the mid chest bilaterally could reflect asymmetric edema or atypical infection in the background has been present, over a series of prior studies. Electronically Signed   By: Zetta Bills M.D.   On: 11/23/2021 08:36   NM PRE Y90 LIVER SPECT LUNG SHUNT ASSESSMENT  Result Date: 11/11/2021 CLINICAL DATA:  Pre Y-90 mapping. Unresectable hepatocellular  carcinoma. EXAM: NUCLEAR MEDICINE LIVER SCAN TECHNIQUE: Abdominal images were obtained in multiple projections after intrahepatic arterial injection of radiopharmaceutical. SPECT imaging was performed. Lung shunt calculation was performed. RADIOPHARMACEUTICALS:  4.74mllicurie MAA TECHNETIUM TO 82M ALBUMIN AGGREGATED COMPARISON:  CT 10/09/2021 FINDINGS: The injected microaggregated albumin localizes within the liver. No evidence of activity within the stomach, duodenum, or bowel. Calculated shunt fraction to the lungs equals 8.5%. IMPRESSION: 1. No significant extrahepatic radiotracer activity following intrahepatic arterial injection of MAA. 2. Lung shunt fraction equals 8.5% Electronically Signed   By: TKerby MoorsM.D.   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 EXAM: 1. Ultrasound-guided access of the left radial artery 2. Catheterization and angiography of the celiac, proper hepatic, and right and left hepatic arteries 3. Cone beam CT 4. MAA injection into the right superior hepatic artery MEDICATIONS: None. ANESTHESIA/SEDATION: Moderate (conscious) sedation was employed during this procedure. A total of Versed 4 mg and Fentanyl 200 mcg was administered intravenously. Moderate Sedation Time: 75 minutes. The patient's level of consciousness and vital signs were monitored continuously by radiology nursing throughout the procedure under my direct supervision. CONTRAST:  690mOMNIPAQUE IOHEXOL 300 MG/ML SOLN, 6383mMNIPAQUE IOHEXOL 300 MG/ML SOLN, 30m32mNIPAQUE IOHEXOL 300 MG/ML SOLN FLUOROSCOPY TIME:  [phone] COMPLICATIONS: None immediate. PROCEDURE: Informed consent was obtained from the patient following explanation of the procedure, risks, benefits and alternatives. The patient understands, agrees and consents for the procedure. All questions were addressed. A time out was performed prior to the initiation of the procedure. Maximal barrier sterile technique utilized including caps, mask, sterile gowns, sterile gloves, large sterile drape, hand hygiene, and Betadine prep. A preliminary ultrasound of the left wrist was performed and demonstrates a patent left radial artery which measured up to 0.44 cm in diameter. Barbeau A. A permanent ultrasound image was recorded. Using a combination of fluoroscopy and ultrasound, an access site was determined. The overlying skin was anesthetized with 1% Lidocaine. Using ultrasound guidance, access into the left radial artery was obtained with visualization of needle entry into  the vessel using standard micropuncture technique. Five French slender sheath was placed. Radial cocktail was administered as per protocol. An MG1 catheter was advanced into the celiac artery. Celiac angiography demonstrates conventional anatomy. The portal vein is patent with hepatopedal flow. A Progreat Omega microcatheter and Fathom 16 microwire were used to select the proper hepatic artery and angiography was performed in multiple obliquities, demonstrating a hypervascular mass in the right lobe, consistent with hepatocellular carcinoma. The microcatheter and wire were then used to select the right superior hepatic artery and angiogram was repeated. Angiography demonstrates supply to the targeted mass. There are at least 3 branches arising from the proximal right superior hepatic artery the directly supply the mass. Opacification of the satellite, smaller hepatoma is also seen. There is no evidence of non-target extra-hepatic branches. Cone beam CT was performed, demonstrating peripherally enhancing mass in segment 8 measuring up to approximately 5.1 cm in maximum axial diameter. Given proximity to the central aspect of the liver, the left hepatic artery was then selected for angiogram. Left hepatic angiogram demonstrated patent vessels with indeterminate supply to the right-sided mass. Sub selection of the segment 4 hepatic artery was performed. Cone beam CT demonstrates no definite opacification of the segment 8 mass from the  segment 4 arteries. The catheter was then repositioned into the right superior hepatic artery. Per protocol, MAA was then infused into the right superior hepatic artery. All wires and catheters were removed. Hemostasis was achieved using a TR band. There were no immediate complications. Peripheral pulses were unchanged1. The patient was transferred to the recovery area in stable condition. IMPRESSION: 1. Visceral angiography demonstrating two hypervascular masses in the right hepatic  lobe, consistent with hepatocellular carcinoma. 2. MAA infusion into the right superior hepatic artery. 3. We will follow up shunt fraction calculations and calculate liver volumes in preparation for radioembolization therapy. Ruthann Cancer, MD [facility] Electronically Signed   By: Ruthann Cancer M.D.   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 EXAM: 1. Ultrasound-guided access of the left radial artery 2. Catheterization and angiography of the celiac, proper hepatic, and right and left hepatic arteries 3. Cone beam CT 4. MAA injection into the right superior hepatic artery MEDICATIONS: None. ANESTHESIA/SEDATION: Moderate (conscious) sedation was employed during this procedure. A total of Versed 4 mg and Fentanyl 200 mcg was administered intravenously. Moderate Sedation Time: 75 minutes. The patient's level of consciousness and vital signs were monitored continuously by radiology nursing throughout the procedure under my direct supervision. CONTRAST:  82m OMNIPAQUE IOHEXOL 300 MG/ML SOLN, 621mOMNIPAQUE IOHEXOL 300 MG/ML SOLN, 6434mMNIPAQUE IOHEXOL 300 MG/ML SOLN FLUOROSCOPY TIME:  1.96.568y COMPLICATIONS: None immediate. PROCEDURE: Informed consent was obtained from the patient following explanation of the procedure, risks, benefits and alternatives. The patient understands, agrees and consents for the procedure. All questions were addressed. A time out was performed prior to the initiation of the procedure. Maximal barrier sterile technique utilized including caps, mask, sterile gowns, sterile gloves, large sterile drape, hand hygiene, and Betadine prep. A preliminary ultrasound of the left wrist was performed and demonstrates a patent left radial artery which measured up to 0.44  cm in diameter. Barbeau A. A permanent ultrasound image was recorded. Using a combination of fluoroscopy and ultrasound, an access site was determined. The overlying skin was anesthetized with 1% Lidocaine. Using ultrasound guidance, access into the left radial artery was obtained with visualization of needle entry into the vessel using standard micropuncture technique. Five French slender sheath was placed. Radial cocktail was administered as per protocol. An MG1 catheter was advanced into the celiac artery. Celiac angiography demonstrates conventional anatomy. The portal vein is patent with hepatopedal flow. A Progreat Omega microcatheter and Fathom 16 microwire were used to select the proper hepatic artery and angiography was performed in multiple obliquities, demonstrating a hypervascular mass in the right lobe, consistent with hepatocellular carcinoma. The microcatheter and wire were then used to select the right superior hepatic artery and angiogram was repeated. Angiography demonstrates supply to the targeted mass. There are at least 3 branches arising from the proximal right superior hepatic artery the directly supply the mass. Opacification of the satellite, smaller hepatoma is also seen. There is no evidence of non-target extra-hepatic branches. Cone beam CT was performed, demonstrating peripherally enhancing mass in segment 8 measuring up to approximately 5.1 cm in maximum axial diameter. Given proximity to the central aspect of the liver, the left hepatic artery was then selected for angiogram. Left hepatic angiogram demonstrated patent vessels with indeterminate supply to the right-sided mass. Sub selection of  the segment 4 hepatic artery was performed. Cone beam CT demonstrates no definite opacification of the segment 8 mass from the segment 4 arteries. The catheter was then repositioned into the right superior hepatic artery. Per protocol, MAA was then infused into the right superior hepatic artery.  All wires and catheters were removed. Hemostasis was achieved using a TR band. There were no immediate complications. Peripheral pulses were unchanged1. The patient was transferred to the recovery area in stable condition. IMPRESSION: 1. Visceral angiography demonstrating two hypervascular masses in the right hepatic lobe, consistent with hepatocellular carcinoma. 2. MAA infusion into the right superior hepatic artery. 3. We will follow up shunt fraction calculations and calculate liver volumes in preparation for radioembolization therapy. Ruthann Cancer, MD [facility] Radiology Electronically Signed   By: Ruthann Cancer M.D.   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 EXAM: 1. Ultrasound-guided access of the left radial artery 2. Catheterization and angiography of the celiac, proper hepatic, and right and left hepatic arteries 3. Cone beam CT 4. MAA injection into the right superior hepatic artery MEDICATIONS: None. ANESTHESIA/SEDATION: Moderate (conscious) sedation was employed during this procedure. A total of Versed 4 mg and Fentanyl 200 mcg was administered intravenously. Moderate Sedation Time: 75 minutes. The patient's level of consciousness and vital signs were monitored continuously by radiology nursing throughout the procedure under my direct supervision. CONTRAST:  68m OMNIPAQUE IOHEXOL 300 MG/ML SOLN, 655mOMNIPAQUE IOHEXOL 300 MG/ML SOLN, 6420mMNIPAQUE IOHEXOL 300 MG/ML SOLN FLUOROSCOPY TIME:  1.93.299y COMPLICATIONS: None immediate. PROCEDURE: Informed consent was obtained from the patient following explanation of the procedure, risks, benefits and alternatives. The patient understands, agrees and consents for the procedure. All questions were addressed. A time out was performed  prior to the initiation of the procedure. Maximal barrier sterile technique utilized including caps, mask, sterile gowns, sterile gloves, large sterile drape, hand hygiene, and Betadine prep. A preliminary ultrasound of the left wrist was performed and demonstrates a patent left radial artery which measured up to 0.44 cm in diameter. Barbeau A. A permanent ultrasound image was recorded. Using a combination of fluoroscopy and ultrasound, an access site was determined. The overlying skin was anesthetized with 1% Lidocaine. Using ultrasound guidance, access into the left radial artery was obtained with visualization of needle entry into the vessel using standard micropuncture technique. Five French slender sheath was placed. Radial cocktail was administered as per protocol. An MG1 catheter was advanced into the celiac artery. Celiac angiography demonstrates conventional anatomy. The portal vein is patent with hepatopedal flow. A Progreat Omega microcatheter and Fathom 16 microwire were used to select the proper hepatic artery and angiography was performed in multiple obliquities, demonstrating a hypervascular mass in the right lobe, consistent with hepatocellular carcinoma. The microcatheter and wire were then used to select the right superior hepatic artery and angiogram was repeated. Angiography demonstrates supply to the targeted mass. There are at least 3 branches arising from the proximal right superior hepatic artery the directly supply the mass. Opacification of the satellite, smaller hepatoma is also seen. There is no evidence of non-target extra-hepatic branches. Cone beam CT was performed, demonstrating peripherally enhancing mass in segment 8 measuring up to approximately 5.1 cm in maximum axial diameter. Given proximity to the central aspect of the liver, the left hepatic artery was then  selected for angiogram. Left hepatic angiogram demonstrated patent vessels with indeterminate supply to the right-sided  mass. Sub selection of the segment 4 hepatic artery was performed. Cone beam CT demonstrates no definite opacification of the segment 8 mass from the segment 4 arteries. The catheter was then repositioned into the right superior hepatic artery. Per protocol, MAA was then infused into the right superior hepatic artery. All wires and catheters were removed. Hemostasis was achieved using a TR band. There were no immediate complications. Peripheral pulses were unchanged1. The patient was transferred to the recovery area in stable condition. IMPRESSION: 1. Visceral angiography demonstrating two hypervascular masses in the right hepatic lobe, consistent with hepatocellular carcinoma. 2. MAA infusion into the right superior hepatic artery. 3. We will follow up shunt fraction calculations and calculate liver volumes in preparation for radioembolization therapy. Ruthann Cancer, MD [facility] Radiology Electronically Signed   By: Ruthann Cancer M.D.   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 EXAM: 1. Ultrasound-guided access of the left radial artery 2. Catheterization and angiography of the celiac, proper hepatic, and right and left hepatic arteries 3. Cone beam CT 4. MAA injection into the right superior hepatic artery MEDICATIONS: None. ANESTHESIA/SEDATION: Moderate (conscious) sedation was employed during this procedure. A total of Versed 4 mg and Fentanyl 200 mcg was administered intravenously. Moderate Sedation Time: 75 minutes. The patient's level of consciousness and vital signs were monitored continuously by radiology nursing throughout the procedure under my direct supervision. CONTRAST:  35m OMNIPAQUE IOHEXOL 300 MG/ML SOLN, 626mOMNIPAQUE IOHEXOL 300 MG/ML SOLN, 64101mMNIPAQUE IOHEXOL 300 MG/ML SOLN  FLUOROSCOPY TIME:  1.95.284y COMPLICATIONS: None immediate. PROCEDURE: Informed consent was obtained from the patient following explanation of the procedure, risks, benefits and alternatives. The patient understands, agrees and consents for the procedure. All questions were addressed. A time out was performed prior to the initiation of the procedure. Maximal barrier sterile technique utilized including caps, mask, sterile gowns, sterile gloves, large sterile drape, hand hygiene, and Betadine prep. A preliminary ultrasound of the left wrist was performed and demonstrates a patent left radial artery which measured up to 0.44 cm in diameter. Barbeau A. A permanent ultrasound image was recorded. Using a combination of fluoroscopy and ultrasound, an access site was determined. The overlying skin was anesthetized with 1% Lidocaine. Using ultrasound guidance, access into the left radial artery was obtained with visualization of needle entry into the vessel using standard micropuncture technique. Five French slender sheath was placed. Radial cocktail was administered as per protocol. An MG1 catheter was advanced into the celiac artery. Celiac angiography demonstrates conventional anatomy. The portal vein is patent with hepatopedal flow. A Progreat Omega microcatheter and Fathom 16 microwire were used to select the proper hepatic artery and angiography was performed in multiple obliquities, demonstrating a hypervascular mass in the right lobe, consistent with hepatocellular carcinoma. The microcatheter and wire were then used to select the right superior hepatic artery and angiogram was repeated. Angiography demonstrates supply to the targeted mass. There are at least 3 branches arising from the proximal right superior hepatic artery the directly supply the mass. Opacification of the satellite, smaller hepatoma is also seen. There is no evidence of non-target extra-hepatic branches. Cone beam CT was performed, demonstrating  peripherally enhancing mass in segment 8 measuring up to approximately 5.1  cm in maximum axial diameter. Given proximity to the central aspect of the liver, the left hepatic artery was then selected for angiogram. Left hepatic angiogram demonstrated patent vessels with indeterminate supply to the right-sided mass. Sub selection of the segment 4 hepatic artery was performed. Cone beam CT demonstrates no definite opacification of the segment 8 mass from the segment 4 arteries. The catheter was then repositioned into the right superior hepatic artery. Per protocol, MAA was then infused into the right superior hepatic artery. All wires and catheters were removed. Hemostasis was achieved using a TR band. There were no immediate complications. Peripheral pulses were unchanged1. The patient was transferred to the recovery area in stable condition. IMPRESSION: 1. Visceral angiography demonstrating two hypervascular masses in the right hepatic lobe, consistent with hepatocellular carcinoma. 2. MAA infusion into the right superior hepatic artery. 3. We will follow up shunt fraction calculations and calculate liver volumes in preparation for radioembolization therapy. Ruthann Cancer, MD [facility]iology Electronically Signed   By: Ruthann Cancer M.D.   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 EXAM: 1. Ultrasound-guided access of the left radial artery 2. Catheterization and angiography of the celiac, proper hepatic, and right and left hepatic arteries 3. Cone beam CT 4. MAA injection into the right superior hepatic artery MEDICATIONS: None. ANESTHESIA/SEDATION: Moderate (conscious) sedation was employed during this procedure. A total of Versed 4 mg and Fentanyl 200 mcg was administered  intravenously. Moderate Sedation Time: 75 minutes. The patient's level of consciousness and vital signs were monitored continuously by radiology nursing throughout the procedure under my direct supervision. CONTRAST:  52m OMNIPAQUE IOHEXOL 300 MG/ML SOLN, 637mOMNIPAQUE IOHEXOL 300 MG/ML SOLN, 6460mMNIPAQUE IOHEXOL 300 MG/ML SOLN FLUOROSCOPY TIME:  1.97.846y COMPLICATIONS: None immediate. PROCEDURE: Informed consent was obtained from the patient following explanation of the procedure, risks, benefits and alternatives. The patient understands, agrees and consents for the procedure. All questions were addressed. A time out was performed prior to the initiation of the procedure. Maximal barrier sterile technique utilized including caps, mask, sterile gowns, sterile gloves, large sterile drape, hand hygiene, and Betadine prep. A preliminary ultrasound of the left wrist was performed and demonstrates a patent left radial artery which measured up to 0.44 cm in diameter. Barbeau A. A permanent ultrasound image was recorded. Using a combination of fluoroscopy and ultrasound, an access site was determined. The overlying skin was anesthetized with 1% Lidocaine. Using ultrasound guidance, access into the left radial artery was obtained with visualization of needle entry into the vessel using standard micropuncture technique. Five French slender sheath was placed. Radial cocktail was administered as per protocol. An MG1 catheter was advanced into the celiac artery. Celiac angiography demonstrates conventional anatomy. The portal vein is patent with hepatopedal flow. A Progreat Omega microcatheter and Fathom 16 microwire were used to select the proper hepatic artery and angiography was performed in multiple obliquities, demonstrating a hypervascular mass in the right lobe, consistent with hepatocellular carcinoma. The microcatheter and wire were then used to select the right superior hepatic artery and angiogram was repeated.  Angiography demonstrates supply to the targeted mass. There are at least 3 branches arising from the proximal right superior hepatic artery the directly supply the mass. Opacification of the satellite, smaller hepatoma is also seen. There is no evidence  of non-target extra-hepatic branches. Cone beam CT was performed, demonstrating peripherally enhancing mass in segment 8 measuring up to approximately 5.1 cm in maximum axial diameter. Given proximity to the central aspect of the liver, the left hepatic artery was then selected for angiogram. Left hepatic angiogram demonstrated patent vessels with indeterminate supply to the right-sided mass. Sub selection of the segment 4 hepatic artery was performed. Cone beam CT demonstrates no definite opacification of the segment 8 mass from the segment 4 arteries. The catheter was then repositioned into the right superior hepatic artery. Per protocol, MAA was then infused into the right superior hepatic artery. All wires and catheters were removed. Hemostasis was achieved using a TR band. There were no immediate complications. Peripheral pulses were unchanged1. The patient was transferred to the recovery area in stable condition. IMPRESSION: 1. Visceral angiography demonstrating two hypervascular masses in the right hepatic lobe, consistent with hepatocellular carcinoma. 2. MAA infusion into the right superior hepatic artery. 3. We will follow up shunt fraction calculations and calculate liver volumes in preparation for radioembolization therapy. Ruthann Cancer, MD [facility] Radiology Electronically Signed   By: Ruthann Cancer M.D.   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 EXAM: 1. Ultrasound-guided access of the left radial artery 2.  Catheterization and angiography of the celiac, proper hepatic, and right and left hepatic arteries 3. Cone beam CT 4. MAA injection into the right superior hepatic artery MEDICATIONS: None. ANESTHESIA/SEDATION: Moderate (conscious) sedation was employed during this procedure. A total of Versed 4 mg and Fentanyl 200 mcg was administered intravenously. Moderate Sedation Time: 75 minutes. The patient's level of consciousness and vital signs were monitored continuously by radiology nursing throughout the procedure under my direct supervision. CONTRAST:  72m OMNIPAQUE IOHEXOL 300 MG/ML SOLN, 614mOMNIPAQUE IOHEXOL 300 MG/ML SOLN, 6429mMNIPAQUE IOHEXOL 300 MG/ML SOLN FLUOROSCOPY TIME:  1.96.387y COMPLICATIONS: None immediate. PROCEDURE: Informed consent was obtained from the patient following explanation of the procedure, risks, benefits and alternatives. The patient understands, agrees and consents for the procedure. All questions were addressed. A time out was performed prior to the initiation of the procedure. Maximal barrier sterile technique utilized including caps, mask, sterile gowns, sterile gloves, large sterile drape, hand hygiene, and Betadine prep. A preliminary ultrasound of the left wrist was performed and demonstrates a patent left radial artery which measured up to 0.44 cm in diameter. Barbeau A. A permanent ultrasound image was recorded. Using a combination of fluoroscopy and ultrasound, an access site was determined. The overlying skin was anesthetized with 1% Lidocaine. Using ultrasound guidance, access into the left radial artery was obtained with visualization of needle entry into the vessel using standard micropuncture technique. Five French slender sheath was placed. Radial cocktail was administered as per protocol. An MG1 catheter was advanced into the celiac artery. Celiac angiography demonstrates conventional anatomy. The portal vein is patent with hepatopedal flow. A Progreat Omega  microcatheter and Fathom 16 microwire were used to select the proper hepatic artery and angiography was performed in multiple obliquities, demonstrating a hypervascular mass in the right lobe, consistent with hepatocellular carcinoma. The microcatheter and wire were then used to select the right superior hepatic artery and angiogram was repeated. Angiography demonstrates supply to the targeted mass. There are at least 3 branches arising from the proximal right  superior hepatic artery the directly supply the mass. Opacification of the satellite, smaller hepatoma is also seen. There is no evidence of non-target extra-hepatic branches. Cone beam CT was performed, demonstrating peripherally enhancing mass in segment 8 measuring up to approximately 5.1 cm in maximum axial diameter. Given proximity to the central aspect of the liver, the left hepatic artery was then selected for angiogram. Left hepatic angiogram demonstrated patent vessels with indeterminate supply to the right-sided mass. Sub selection of the segment 4 hepatic artery was performed. Cone beam CT demonstrates no definite opacification of the segment 8 mass from the segment 4 arteries. The catheter was then repositioned into the right superior hepatic artery. Per protocol, MAA was then infused into the right superior hepatic artery. All wires and catheters were removed. Hemostasis was achieved using a TR band. There were no immediate complications. Peripheral pulses were unchanged1. The patient was transferred to the recovery area in stable condition. IMPRESSION: 1. Visceral angiography demonstrating two hypervascular masses in the right hepatic lobe, consistent with hepatocellular carcinoma. 2. MAA infusion into the right superior hepatic artery. 3. We will follow up shunt fraction calculations and calculate liver volumes in preparation for radioembolization therapy. Ruthann Cancer, MD [facility] Radiology  Electronically Signed   By: Ruthann Cancer M.D.   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 EXAM: 1. Ultrasound-guided access of the left radial artery 2. Catheterization and angiography of the celiac, proper hepatic, and right and left hepatic arteries 3. Cone beam CT 4. MAA injection into the right superior hepatic artery MEDICATIONS: None. ANESTHESIA/SEDATION: Moderate (conscious) sedation was employed during this procedure. A total of Versed 4 mg and Fentanyl 200 mcg was administered intravenously. Moderate Sedation Time: 75 minutes. The patient's level of consciousness and vital signs were monitored continuously by radiology nursing throughout the procedure under my direct supervision. CONTRAST:  62m OMNIPAQUE IOHEXOL 300 MG/ML SOLN, 624mOMNIPAQUE IOHEXOL 300 MG/ML SOLN, 6456mMNIPAQUE IOHEXOL 300 MG/ML SOLN FLUOROSCOPY TIME:  1.91.017y COMPLICATIONS: None immediate. PROCEDURE: Informed consent was obtained from the patient following explanation of the procedure, risks, benefits and alternatives. The patient understands, agrees and consents for the procedure. All questions were addressed. A time out was performed prior to the initiation of the procedure. Maximal barrier sterile technique utilized including caps, mask, sterile gowns, sterile gloves, large sterile drape, hand hygiene, and Betadine prep. A preliminary ultrasound of the left wrist was performed and demonstrates a patent left radial artery which measured up to 0.44 cm in diameter. Barbeau A. A permanent ultrasound image was recorded. Using a combination of fluoroscopy and ultrasound, an access site was determined. The overlying skin was anesthetized with 1% Lidocaine. Using ultrasound guidance, access into the left radial artery was obtained with  visualization of needle entry into the vessel using standard micropuncture technique. Five French slender sheath was placed. Radial cocktail was administered as per protocol. An MG1 catheter was advanced into the celiac artery. Celiac angiography demonstrates conventional anatomy. The portal vein is patent with hepatopedal flow. A Progreat Omega microcatheter and Fathom 16 microwire were used to select the proper hepatic artery and angiography was performed in multiple obliquities, demonstrating a hypervascular mass in the right lobe, consistent with hepatocellular carcinoma. The microcatheter and wire were then used to select the right superior hepatic artery and  angiogram was repeated. Angiography demonstrates supply to the targeted mass. There are at least 3 branches arising from the proximal right superior hepatic artery the directly supply the mass. Opacification of the satellite, smaller hepatoma is also seen. There is no evidence of non-target extra-hepatic branches. Cone beam CT was performed, demonstrating peripherally enhancing mass in segment 8 measuring up to approximately 5.1 cm in maximum axial diameter. Given proximity to the central aspect of the liver, the left hepatic artery was then selected for angiogram. Left hepatic angiogram demonstrated patent vessels with indeterminate supply to the right-sided mass. Sub selection of the segment 4 hepatic artery was performed. Cone beam CT demonstrates no definite opacification of the segment 8 mass from the segment 4 arteries. The catheter was then repositioned into the right superior hepatic artery. Per protocol, MAA was then infused into the right superior hepatic artery. All wires and catheters were removed. Hemostasis was achieved using a TR band. There were no immediate complications. Peripheral pulses were unchanged1. The patient was transferred to the recovery area in stable condition. IMPRESSION: 1. Visceral angiography demonstrating two  hypervascular masses in the right hepatic lobe, consistent with hepatocellular carcinoma. 2. MAA infusion into the right superior hepatic artery. 3. We will follow up shunt fraction calculations and calculate liver volumes in preparation for radioembolization therapy. Ruthann Cancer, MD [facility] Radiology Electronically Signed   By: Ruthann Cancer M.D.   On: 11/07/2021 13:52    ED Course / MDM    Medical Decision Making Amount and/or Complexity of Data Reviewed Labs: ordered. Radiology: ordered.    Personally viewed labs and imaging   Patient assessed at bedside.  States he is currently asymptomatic and requesting discharge home.  I feel this is reasonable.  Encourage compliance with dialysis, return for any worsening symptoms.   The patient has been appropriately medically screened and/or stabilized in the ED. I have low suspicion for any other emergent medical condition which would require further screening, evaluation or treatment in the ED or require inpatient management.  Patient is hemodynamically stable and in no acute distress.  Patient able to ambulate in department prior to ED.  Evaluation does not show acute pathology that would require ongoing or additional emergent interventions while in the emergency department or further inpatient treatment.  I have discussed the diagnosis with the patient and answered all questions.  Pain is been managed while in the emergency department and patient has no further complaints prior to discharge.  Patient is comfortable with plan discussed in room and is stable for discharge at this time.  I have discussed strict return precautions for returning to the emergency department.  Patient was encouraged to follow-up with PCP/specialist refer to at discharge.       Cashel Bellina A, PA-C 11/23/21 1845    Gareth Morgan, MD 11/24/21 1205

## 2021-11-23 NOTE — Progress Notes (Signed)
Mr. Juenger returns back to the ED c/o SOB and RLE swelling. He missed 2 treatments last week due to transportation issues. Seen and examined patient at bedside. Currently on RA and not in acute respiratory distress. Plan to dialyze him today with goal for him to be discharged afterwards so he can resume outpatient dialysis on Monday. Plan to also reach out to HD Charge RN and SW to discuss his transportation issues and to explore other options so he can make his treatments.  HD Orders: AF-MWF 4 hrs BFR 450 DFR 500 EDW 101kg 2K/2Ca L AVF  Tobie Poet, NP [facility]

## 2021-11-23 NOTE — ED Notes (Signed)
Pt transported to dialysis with his consent form signed & witnessed by this RN at bedside.

## 2021-11-26 ENCOUNTER — Other Ambulatory Visit: Payer: Self-pay

## 2021-11-26 ENCOUNTER — Emergency Department (HOSPITAL_COMMUNITY): Payer: Medicare Other

## 2021-11-26 ENCOUNTER — Emergency Department (HOSPITAL_COMMUNITY)
Admission: EM | Admit: 2021-11-26 | Discharge: 2021-11-26 | Discharge status: Against Medical Advice | Payer: Medicare Other | Attending: Emergency Medicine | Admitting: Emergency Medicine

## 2021-11-26 ENCOUNTER — Encounter (HOSPITAL_COMMUNITY): Payer: Self-pay | Admitting: Emergency Medicine

## 2021-11-26 ENCOUNTER — Ambulatory Visit: Payer: Self-pay | Admitting: *Deleted

## 2021-11-26 ENCOUNTER — Other Ambulatory Visit: Payer: Self-pay | Admitting: Radiology

## 2021-11-26 DIAGNOSIS — R0602 Shortness of breath: Secondary | ICD-10-CM | POA: Insufficient documentation

## 2021-11-26 DIAGNOSIS — Z992 Dependence on renal dialysis: Secondary | ICD-10-CM | POA: Insufficient documentation

## 2021-11-26 DIAGNOSIS — C220 Liver cell carcinoma: Secondary | ICD-10-CM

## 2021-11-26 DIAGNOSIS — Z5321 Procedure and treatment not carried out due to patient leaving prior to being seen by health care provider: Secondary | ICD-10-CM | POA: Diagnosis not present

## 2021-11-26 LAB — BASIC METABOLIC PANEL
Anion gap: 17 — ABNORMAL HIGH (ref 5–15)
BUN: 37 mg/dL — ABNORMAL HIGH (ref 8–23)
CO2: 21 mmol/L — ABNORMAL LOW (ref 22–32)
Calcium: 8.5 mg/dL — ABNORMAL LOW (ref 8.9–10.3)
Chloride: 93 mmol/L — ABNORMAL LOW (ref 98–111)
Creatinine, Ser: 9.22 mg/dL — ABNORMAL HIGH (ref 0.61–1.24)
GFR, Estimated: 6 mL/min — ABNORMAL LOW (ref >60)
Glucose, Bld: 81 mg/dL (ref 70–99)
Potassium: 4.1 mmol/L (ref 3.5–5.1)
Sodium: 131 mmol/L — ABNORMAL LOW (ref 135–145)

## 2021-11-26 LAB — CBC WITH DIFFERENTIAL/PLATELET
Abs Immature Granulocytes: 0 10*3/uL (ref 0.00–0.07)
Basophils Absolute: 0 10*3/uL (ref 0.0–0.1)
Basophils Relative: 1 %
Eosinophils Absolute: 0.1 10*3/uL (ref 0.0–0.5)
Eosinophils Relative: 2 %
HCT: 23 % — ABNORMAL LOW (ref 39.0–52.0)
Hemoglobin: 7.6 g/dL — ABNORMAL LOW (ref 13.0–17.0)
Immature Granulocytes: 0 %
Lymphocytes Relative: 28 %
Lymphs Abs: 1.1 10*3/uL (ref 0.7–4.0)
MCH: 32.9 pg (ref 26.0–34.0)
MCHC: 33 g/dL (ref 30.0–36.0)
MCV: 99.6 fL (ref 80.0–100.0)
Monocytes Absolute: 0.5 10*3/uL (ref 0.1–1.0)
Monocytes Relative: 12 %
Neutro Abs: 2.3 10*3/uL (ref 1.7–7.7)
Neutrophils Relative %: 57 %
Platelets: 116 10*3/uL — ABNORMAL LOW (ref 150–400)
RBC: 2.31 MIL/uL — ABNORMAL LOW (ref 4.22–5.81)
RDW: 16.6 % — ABNORMAL HIGH (ref 11.5–15.5)
WBC: 3.9 10*3/uL — ABNORMAL LOW (ref 4.0–10.5)
nRBC: 0 % (ref 0.0–0.2)

## 2021-11-26 NOTE — Patient Outreach (Signed)
  Care Coordination   Follow Up Visit Note   11/26/2021 Name: Daniel [name] MRN: [id number] DOB: May 20, 1960  Daniel [name] is a 61 y.o. year old male who sees [name], NP for primary care. I spoke with  Daniel [name] by phone today  What matters to the patients health and wellness today?  Getting transportation, I don't have gas.   Goals Addressed               This Visit's Progress     Patient Stated     Switch to dialysis center closer to his home to save gas. (pt-stated)        Called current dialysis center and talked with Levada Dy, Psychologist, counselling. She informed me that her social worker, Ivin Booty, has called pt to advise of transportation servies. NP advised will ask for a gas card from [facility]. (We don't have gas cards and this would be needed for this need, not a gift card). Also she will inquire as to whether the facility [facility]) can accomodate him. Pt called pt back and gave        SDOH assessments and interventions completed:   Yes Ran out of gas, doesn't get check until Thursday. Has missed 2 dialysis txs and been to the ED.  Care Coordination Interventions Activated:  Yes Care Coordination Interventions:  Yes, provided Talked with dialysis center, Levada Dy, nurse manager who said they have referred him to [facility] [employer]. Also NP asked for transfer of pt to another dialysis center closer to him. Levada Dy said she would inquire to see if they have any availablity.  Follow up plan: Follow up call scheduled for 2 weeks.  Encounter Outcome:  Pt. Visit Completed  Kayleen Memos C. Myrtie Neither, MSN, [facility] Gerontological Nurse Practitioner [facility] Care Management [phone]

## 2021-11-26 NOTE — ED Provider Triage Note (Signed)
  Emergency Medicine Provider Triage Evaluation Note  MRN:  [id number]  Arrival date & time: 11/26/21    Medically screening exam initiated at 4:02 AM.   CC:   Shortness of Breath   HPI:  Daniel [name] is a 61 y.o. year-old male presents to the ED with chief complaint of SOB.  Missed dialysis yesterday, so he came to the ED today.  Next dialysis is Wednesday.  Last dialyzed on Saturday.  Complains of SOB.  History provided by patient ROS:  -As included in HPI PE:   Vitals:   11/26/21 0357  BP: (!) 166/96  Pulse: 88  Resp: 17  Temp: 97.7 F (36.5 C)  SpO2: 99%    Non-toxic appearing No respiratory distress  MDM:   I've ordered labs and imaging in triage to expedite lab/diagnostic workup.  Patient was informed that the remainder of the evaluation will be completed by another provider, this initial triage assessment does not replace that evaluation, and the importance of remaining in the ED until their evaluation is complete.    Montine Circle, PA-C 11/26/21 0403

## 2021-11-26 NOTE — ED Triage Notes (Signed)
Pt reported to ED for evaluation of shortness of breath stating he needs dialysis. States he is M-W-F but missed Monday's session.

## 2021-11-26 NOTE — ED Notes (Signed)
PATIENT IS READY TO LEAVE HAS BEEN ADVISED

## 2021-11-27 ENCOUNTER — Encounter (HOSPITAL_COMMUNITY): Payer: Self-pay | Admitting: Emergency Medicine

## 2021-11-27 ENCOUNTER — Emergency Department (HOSPITAL_COMMUNITY): Payer: Medicare Other

## 2021-11-27 ENCOUNTER — Emergency Department (HOSPITAL_COMMUNITY)
Admission: EM | Admit: 2021-11-27 | Discharge: 2021-11-27 | Discharge status: Against Medical Advice | Payer: Medicare Other | Attending: Emergency Medicine | Admitting: Emergency Medicine

## 2021-11-27 ENCOUNTER — Other Ambulatory Visit: Payer: Self-pay

## 2021-11-27 DIAGNOSIS — Z992 Dependence on renal dialysis: Secondary | ICD-10-CM | POA: Insufficient documentation

## 2021-11-27 DIAGNOSIS — Z79899 Other long term (current) drug therapy: Secondary | ICD-10-CM | POA: Insufficient documentation

## 2021-11-27 DIAGNOSIS — N186 End stage renal disease: Secondary | ICD-10-CM | POA: Insufficient documentation

## 2021-11-27 DIAGNOSIS — D649 Anemia, unspecified: Secondary | ICD-10-CM | POA: Insufficient documentation

## 2021-11-27 DIAGNOSIS — R0602 Shortness of breath: Secondary | ICD-10-CM | POA: Diagnosis present

## 2021-11-27 DIAGNOSIS — Z20822 Contact with and (suspected) exposure to covid-19: Secondary | ICD-10-CM | POA: Diagnosis not present

## 2021-11-27 DIAGNOSIS — E1122 Type 2 diabetes mellitus with diabetic chronic kidney disease: Secondary | ICD-10-CM | POA: Diagnosis not present

## 2021-11-27 DIAGNOSIS — J811 Chronic pulmonary edema: Secondary | ICD-10-CM | POA: Insufficient documentation

## 2021-11-27 DIAGNOSIS — I120 Hypertensive chronic kidney disease with stage 5 chronic kidney disease or end stage renal disease: Secondary | ICD-10-CM | POA: Insufficient documentation

## 2021-11-27 DIAGNOSIS — Z8505 Personal history of malignant neoplasm of liver: Secondary | ICD-10-CM | POA: Diagnosis not present

## 2021-11-27 LAB — COMPREHENSIVE METABOLIC PANEL
ALT: 15 U/L (ref 0–44)
AST: 45 U/L — ABNORMAL HIGH (ref 15–41)
Albumin: 2.5 g/dL — ABNORMAL LOW (ref 3.5–5.0)
Alkaline Phosphatase: 117 U/L (ref 38–126)
Anion gap: 14 (ref 5–15)
BUN: 45 mg/dL — ABNORMAL HIGH (ref 8–23)
CO2: 19 mmol/L — ABNORMAL LOW (ref 22–32)
Calcium: 8 mg/dL — ABNORMAL LOW (ref 8.9–10.3)
Chloride: 93 mmol/L — ABNORMAL LOW (ref 98–111)
Creatinine, Ser: 10.48 mg/dL — ABNORMAL HIGH (ref 0.61–1.24)
GFR, Estimated: 5 mL/min — ABNORMAL LOW (ref >60)
Glucose, Bld: 79 mg/dL (ref 70–99)
Potassium: 4.2 mmol/L (ref 3.5–5.1)
Sodium: 126 mmol/L — ABNORMAL LOW (ref 135–145)
Total Bilirubin: 1.1 mg/dL (ref 0.3–1.2)
Total Protein: 8.2 g/dL — ABNORMAL HIGH (ref 6.5–8.1)

## 2021-11-27 LAB — CBC WITH DIFFERENTIAL/PLATELET
Abs Immature Granulocytes: 0.01 10*3/uL (ref 0.00–0.07)
Basophils Absolute: 0 10*3/uL (ref 0.0–0.1)
Basophils Relative: 1 %
Eosinophils Absolute: 0.1 10*3/uL (ref 0.0–0.5)
Eosinophils Relative: 1 %
HCT: 21.6 % — ABNORMAL LOW (ref 39.0–52.0)
Hemoglobin: 7.1 g/dL — ABNORMAL LOW (ref 13.0–17.0)
Immature Granulocytes: 0 %
Lymphocytes Relative: 24 %
Lymphs Abs: 0.9 10*3/uL (ref 0.7–4.0)
MCH: 32.9 pg (ref 26.0–34.0)
MCHC: 32.9 g/dL (ref 30.0–36.0)
MCV: 100 fL (ref 80.0–100.0)
Monocytes Absolute: 0.5 10*3/uL (ref 0.1–1.0)
Monocytes Relative: 14 %
Neutro Abs: 2.3 10*3/uL (ref 1.7–7.7)
Neutrophils Relative %: 60 %
Platelets: 107 10*3/uL — ABNORMAL LOW (ref 150–400)
RBC: 2.16 MIL/uL — ABNORMAL LOW (ref 4.22–5.81)
RDW: 16.6 % — ABNORMAL HIGH (ref 11.5–15.5)
WBC: 3.8 10*3/uL — ABNORMAL LOW (ref 4.0–10.5)
nRBC: 0 % (ref 0.0–0.2)

## 2021-11-27 LAB — I-STAT CHEM 8, ED
BUN: 47 mg/dL — ABNORMAL HIGH (ref 8–23)
Calcium, Ion: 0.94 mmol/L — ABNORMAL LOW (ref 1.15–1.40)
Chloride: 94 mmol/L — ABNORMAL LOW (ref 98–111)
Creatinine, Ser: 11.9 mg/dL — ABNORMAL HIGH (ref 0.61–1.24)
Glucose, Bld: 79 mg/dL (ref 70–99)
HCT: 22 % — ABNORMAL LOW (ref 39.0–52.0)
Hemoglobin: 7.5 g/dL — ABNORMAL LOW (ref 13.0–17.0)
Potassium: 4.4 mmol/L (ref 3.5–5.1)
Sodium: 127 mmol/L — ABNORMAL LOW (ref 135–145)
TCO2: 20 mmol/L — ABNORMAL LOW (ref 22–32)

## 2021-11-27 LAB — TROPONIN I (HIGH SENSITIVITY)
Troponin I (High Sensitivity): 13 ng/L (ref <18)
Troponin I (High Sensitivity): 14 ng/L (ref <18)

## 2021-11-27 LAB — RESP PANEL BY RT-PCR (FLU A&B, COVID) ARPGX2
Influenza A by PCR: NEGATIVE
Influenza B by PCR: NEGATIVE
SARS Coronavirus 2 by RT PCR: NEGATIVE

## 2021-11-27 MED ORDER — LIDOCAINE HCL (PF) 1 % IJ SOLN: 5.0000 mL | INTRAMUSCULAR | Status: DC | PRN

## 2021-11-27 MED ORDER — CHLORHEXIDINE GLUCONATE CLOTH 2 % EX PADS: 6.0000 | MEDICATED_PAD | Freq: Every day | CUTANEOUS | Status: DC

## 2021-11-27 MED ORDER — LIDOCAINE-PRILOCAINE 2.5-2.5 % EX CREA
1.0000 | TOPICAL_CREAM | CUTANEOUS | Status: DC | PRN
  Filled 2021-11-27: qty 5

## 2021-11-27 MED ORDER — PENTAFLUOROPROP-TETRAFLUOROETH EX AERO
1.0000 | INHALATION_SPRAY | CUTANEOUS | Status: DC | PRN
  Filled 2021-11-27: qty 30

## 2021-11-27 NOTE — ED Notes (Signed)
Pt demands to leave. Refuses AMA signature, but listens to this Rns explanation of risks of leaving. Pt agrees to still leave. PA notified.

## 2021-11-27 NOTE — Procedures (Signed)
Patient was seen on dialysis and the procedure was supervised.  BFR 400  Via AVF BP is  160/72.   Patient appears to be tolerating treatment well. UF 4 liters.  See renal navigator note for detail discharge plan/transportation.  Will go to ER after completing HD.  Daniel [name] Daniel [name] 11/27/2021

## 2021-11-27 NOTE — Progress Notes (Addendum)
Contacted by nephrologist regarding pt's need for out-pt HD at clinic today if possible. Contacted [facility] and clinic can treat pt at pt's regular time (arrive at 11:00 for 11:15 chair time). Update provided to nephrologist and ED staff. Pt is saying he does not have transportation to clinic for treatment there today. Contacted [facility] again to make them aware of this info. Clinic social worker has made arrangements for pt to receive a call from DSS case worker to complete transportation assessment via phone but pt will need to answer the phone. Clinic social worker was unable to reach pt and left a message for pt to please answer the phone when case worker calls to complete assessment. Navigator attempted to reach pt via phone as well. Call went to voicemail and message was left to say to please answer call from DSS case worker when they call. Navigator did call medicaid transportation to see if pt had completed an assessment and he has not per staff member. Contacted ED CSW to inquire if there are resources through [facility] that would pay for pt to be transported to clinic and then to home. There are not resources for that at this time. Pt will require HD here at [facility] for the above reasons.   Received an e-mail from clinic social worker that pt is not eligible for medicaid transportation. Clinic SW to attempt to complete [employer] [place] app today.   Melven Sartorius Renal Navigator [phone]  Addendum at 11:35 am: Clinic social worker completed [employer] [place] application. Pt signed application while in the ED when navigator took application by for signature. Pt voices there may be issues with transportation picking pt up from home due to location of pt's ramp at back of house but pt agreeable to sign application. Signed application faxed to [facility] at [place] for review. Pt did inquire about changing clinics. Advised pt that his clinic would have to contact other [place] clinics to check on availability and then pt would  be placed on a waiting list if needed.

## 2021-11-27 NOTE — ED Provider Notes (Cosign Needed)
[place] EMERGENCY DEPARTMENT Provider Note   CSN: [id number] Arrival date & time: 11/27/21  0865     History  Chief Complaint  Patient presents with  . Vascular Access Problem    Daniel [name] is a 61 y.o. male history includes ESRD M, W, F, hypertension, PAD, left BKA, diabetes, anemia, obesity, hepatocellular carcinoma.  Patient presents to the ER today for concern of chest pain/shortness of breath and need for dialysis.  Patient reports his last dialysis session was Saturday, 11/23/2020 here at the hospital after he presented to the ER for similar symptoms.  Patient reports that his shortness of breath/chest pain has slowly developed over the past 2 days he feels this is his typical chest tightness which is mild and central, no clear aggravating or alleviating factors he feels this is due to his need for dialysis he denies any abnormal features of his chest pain/shortness of breath today.  Additionally patient does report small amount of nonbloody diarrhea for the past 3-4 days around 3 episodes per day.    He denies fever, chills, pleurisy, nausea, vomiting, abdominal pain, extremity swelling/color change or any additional concerns     HPI     Home Medications Prior to Admission medications   Medication Sig Start Date End Date Taking? Authorizing Provider  amLODipine (NORVASC) 5 MG tablet Take 1 tablet (5 mg total) by mouth daily. 09/18/21 12/17/21 Yes [place] Eric J, DO  atorvastatin (LIPITOR) 40 MG tablet Take 1 tablet (40 mg total) by mouth daily. 09/18/21 12/17/21 Yes [place] Eric J, DO  gabapentin (NEURONTIN) 300 MG capsule Take 1 capsule (300 mg total) by mouth at bedtime. 11/15/21  Yes Nita Sells, MD  pantoprazole (PROTONIX) 40 MG tablet Take 1 tablet (40 mg total) by mouth 2 (two) times daily before a meal. 10/10/21 01/08/22 Yes Mercy Riding, MD  traZODone (DESYREL) 50 MG tablet Take 1 tablet (50 mg total) by mouth at bedtime as needed  for sleep. 11/15/21  Yes Nita Sells, MD  colchicine 0.6 MG tablet Take 0.5 tablets (0.3 mg total) by mouth 2 (two) times daily. 07/24/20 07/29/20  Noemi Chapel, MD  furosemide (LASIX) 40 MG tablet Take 1 tablet (40 mg total) by mouth daily. 07/25/19 02/09/20  Ladona Horns, MD      Allergies    Seroquel [quetiapine] and Dilaudid [hydromorphone hcl]    Review of Systems   Review of Systems Ten systems are reviewed and are negative for acute change except as noted in the HPI  Physical Exam Updated Vital Signs BP (!) 164/93   Pulse 82   Temp 98 F (36.7 C) (Oral)   Resp 17   SpO2 100%  Physical Exam Constitutional:      General: He is not in acute distress.    Appearance: Normal appearance. He is well-developed. He is not ill-appearing or diaphoretic.  HENT:     Head: Normocephalic and atraumatic.  Eyes:     General: Vision grossly intact. Gaze aligned appropriately.     Pupils: Pupils are equal, round, and reactive to light.  Neck:     Trachea: Trachea and phonation normal.  Cardiovascular:     Rate and Rhythm: Normal rate and regular rhythm.     Pulses:          Dorsalis pedis pulses are 1+ on the right side.     Comments: Left BKA Pulmonary:     Effort: Pulmonary effort is normal. No tachypnea, accessory muscle usage or  respiratory distress.     Breath sounds: Normal air entry. Rales present.  Abdominal:     General: There is no distension.     Palpations: Abdomen is soft.     Tenderness: There is no abdominal tenderness. There is no guarding or rebound.  Musculoskeletal:        General: Normal range of motion.     Cervical back: Normal range of motion.     Right lower leg: 1+ Edema present.  Skin:    General: Skin is warm and dry.  Neurological:     Mental Status: He is alert.     GCS: GCS eye subscore is 4. GCS verbal subscore is 5. GCS motor subscore is 6.     Comments: Speech is clear and goal oriented, follows commands Major Cranial nerves without  deficit, no facial droop Moves extremities without ataxia, coordination intact  Psychiatric:        Behavior: Behavior normal.     ED Results / Procedures / Treatments   Labs (all labs ordered are listed, but only abnormal results are displayed) Labs Reviewed  CBC WITH DIFFERENTIAL/PLATELET - Abnormal; Notable for the following components:      Result Value   WBC 3.8 (*)    RBC 2.16 (*)    Hemoglobin 7.1 (*)    HCT 21.6 (*)    RDW 16.6 (*)    Platelets 107 (*)    All other components within normal limits  COMPREHENSIVE METABOLIC PANEL - Abnormal; Notable for the following components:   Sodium 126 (*)    Chloride 93 (*)    CO2 19 (*)    BUN 45 (*)    Creatinine, Ser 10.48 (*)    Calcium 8.0 (*)    Total Protein 8.2 (*)    Albumin 2.5 (*)    AST 45 (*)    GFR, Estimated 5 (*)    All other components within normal limits  I-STAT CHEM 8, ED - Abnormal; Notable for the following components:   Sodium 127 (*)    Chloride 94 (*)    BUN 47 (*)    Creatinine, Ser 11.90 (*)    Calcium, Ion 0.94 (*)    TCO2 20 (*)    Hemoglobin 7.5 (*)    HCT 22.0 (*)    All other components within normal limits  RESP PANEL BY RT-PCR (FLU A&B, COVID) ARPGX2  HEPATITIS B SURFACE ANTIGEN  HEPATITIS B SURFACE ANTIBODY,QUALITATIVE  HEPATITIS B SURFACE ANTIBODY, QUANTITATIVE  HEPATITIS B CORE ANTIBODY, TOTAL  HEPATITIS C ANTIBODY  TROPONIN I (HIGH SENSITIVITY)  TROPONIN I (HIGH SENSITIVITY)    EKG EKG Interpretation  Date/Time:  Wednesday November 27 2021 06:35:16 EDT Ventricular Rate:  83 PR Interval:  177 QRS Duration: 116 QT Interval:  439 QTC Calculation: 516 R Axis:   -20 Text Interpretation: Sinus rhythm Nonspecific intraventricular conduction delay No significant change since last tracing Confirmed by Gareth Morgan [phone]) on 11/27/2021 6:55:51 AM  Radiology DG Chest Portable 1 View  Result Date: 11/27/2021 CLINICAL DATA:  Missed dialysis EXAM: PORTABLE CHEST 1 VIEW COMPARISON:   11/26/2021 FINDINGS: Hazy mid and lower chest opacification with distinct ovoid density at the right base which is likely fissural fluid based on June 2023 chest CT. No interstitial edema, significant effusion, or pneumothorax. Chronic cardiomegaly. Extensive artifact from EKG leads. IMPRESSION: Unchanged cardiomegaly and pulmonary opacity. Electronically Signed   By: Jorje Guild M.D.   On: 11/27/2021 07:07   DG Chest 2  View  Result Date: 11/26/2021 CLINICAL DATA:  Shortness of breath. EXAM: CHEST - 2 VIEW COMPARISON:  11/23/2021. FINDINGS: The heart is enlarged and the mediastinal contour is stable. Atherosclerotic calcification of the aorta is noted. Perihilar airspace disease is noted bilaterally. There is blunting of the right costophrenic angle suggesting small pleural effusion. No pneumothorax. Degenerative changes are present in the thoracic spine. A vascular stent is noted in the left axilla. IMPRESSION: 1. Cardiomegaly. 2. Patchy perihilar airspace disease bilaterally suggesting edema, less likely infection. 3. Small right pleural effusion. Electronically Signed   By: Brett Fairy M.D.   On: 11/26/2021 04:46    Procedures Procedures    Medications Ordered in ED Medications  Chlorhexidine Gluconate Cloth 2 % PADS 6 each (6 each Topical Not Given 11/27/21 9735)    ED Course/ Medical Decision Making/ A&P Clinical Course as of 11/27/21 1152  Wed Nov 27, 2021  0706 DG Chest Portable 1 View I personally reviewed and interpreted patient's 1 view chest x-ray, appears patient has cardiomegaly and likely bilateral edema. [BM]  0707 ED EKG I have personally reviewed and interpreted patient's EKG, I do not appreciate any obvious acute ischemic changes.  No T wave changes suggestive of hyperkalemia. [BM]  [id number] Troponin I (High Sensitivity) Initial high sensitive troponin within normal limits, in the setting of 2 days of chest pain reassuring.  Low suspicion for ACS. [BM]  0819 CBC with  Differential(!) CBC shows anemia of 7.1 appears similar to prior range 7.2-7.6 of the last 12 days.  Mild leukopenia of 3.8 similar to prior.  Thrombocytopenia 107 similar to prior. [BM]  0820 Comprehensive metabolic panel(!) CMP without hyperkalemia, potassium 4.2.  Hyponatremic at 126 suspect secondary to recent episodes of diarrhea.  Creatinine 10.48 consistent with ESRD.  LFTs unremarkable.  Anion gap within normal limits [BM]  0840 Nephrologist Dr. Augustin Coupe recommends consulting Dr. Carolin Sicks, consult replaced by nurse secretary. [BM]  [phone] Nephrologist Dr. Carolin Sicks advises the low sodium is due to hypervolemic hyponatremia should correct with dialysis.  They are trying to arrange patient for possible outpatient dialysis today. [BM]  1100 Spoke with Soyla Dryer NP with interventional radiology.  Advises patient is scheduled for Y90 procedure tomorrow morning at [facility].  Interventional radiology asked that we try to admit the patient to [facility] long after dialysis so he does not miss this procedure tomorrow morning. [BM]  1151 IR spoke again with patient, patient is adamant he has a ride to his appointment tomorrow, his brother will be driving him.  No need for admission from their standpoint given new information. [BM]    Clinical Course User Index [BM] Gari Crown                           Medical Decision Making 61 year old male presented for chest pain/shortness of breath ongoing for the past 2 days he feels this is due to the need for dialysis session.  His last dialysis session was Saturday, 11/23/2021 his symptoms have slowly developed over the past 2 days he denies any abnormal features to SOB/CP.  He does report small amount of nonbloody diarrhea over the past 3 days.  Differential at this time includes not limited to ACS, PE, PTX, PNA, pulmonary edema.   On initial evaluation patient is fully alert and oriented, well-appearing no acute distress vital signs stable  on room air he has bilateral lower rails.  Heart regular rate and rhythm.  He is 1+ right lower extremity edema, left BKA.  Abdomen soft nontender.  Amount and/or Complexity of Data Reviewed External Data Reviewed: notes.    Details: Prior ER notes Labs:  Decision-making details documented in ED Course. Radiology:  Decision-making details documented in ED Course. ECG/medicine tests:  Decision-making details documented in ED Course.  Risk Risk Details: Suspect patient's chest pain/shortness of breath today secondary to pulmonary edema due to ESRD/dialysis.  Plan will be to consult nephrology for dialysis today.  Overall labs are reassuring he does have worsening of his hyponatremia which is suspected to be secondary to his diarrhea over the past 3-4 days.  He has no associate abdominal pain, nausea or vomiting.  No hematochezia or melena.  His abdomen is soft and nontender on examination.  Given pulmonary edema will hold off on saline bolus at this time and consult nephrology for further management.  Case was discussed with attending physician Dr. Doren Custard who is in agreement with plan.  Patient reassessed at 8:15 AM: He is well-appearing and in no acute distress, vital signs are stable on room air.  Suspect patient's shortness of breath/chest pain is secondary to pulmonary edema and need for dialysis.  Low suspicion for ACS, PE, PNA or other emergent cardiopulmonary processes at this time.  He is in agreement with plan and nephrology consultation for dialysis. --------------- Consulted with nephrology, they attempted to arrange outpatient dialysis however patient has issues with transportation and cannot make it to the [facility].  Dr. Carolin Sicks and renal coordinator are setting up patient for dialysis upstairs patient will return to the ER after his session for recheck prior to discharge.  Patient is in agreement with plan, on reassessment he is well-appearing no acute distress vital signs  stable on room air.     Note: Portions of this report may have been transcribed using voice recognition software. Every effort was made to ensure accuracy; however, inadvertent computerized transcription errors may still be present.         Final Clinical Impression(s) / ED Diagnoses Final diagnoses:  ESRD (end stage renal disease) ([place])  Chronic pulmonary edema    Rx / DC Orders ED Discharge Orders     None         Gari Crown 11/27/21 1683

## 2021-11-27 NOTE — ED Provider Notes (Signed)
Patient returns to the ER after dialysis upstairs. According to note from previous provider patient only needed reassessment and discharge after returning from dialysis.   Prior to being able to see the patient, patient informed nursing staff that he was leaving [place]. Nursing staff informed the patient of risks associated with leaving, but patient then proceeded to walk out of the department and I was unable to do an assessment or discuss risks of leaving AMA personally.    Daniel [name] 11/27/21 1830    [name], MD 11/27/21 [phone]

## 2021-11-27 NOTE — ED Notes (Signed)
Pt assisted to the restroom

## 2021-11-27 NOTE — Progress Notes (Addendum)
Nephrology Brief Obs note:  ER contacted to arrange HD in the hospital. He p/w SOB, His last OP dialysis was on 7/24 and had last HD in the hospital on 7/29. ? Issue with transportation.  CXR with chronic cardiomegaly, labs Na 127, K 4.4.  Plan:  HD in the hospital.  He will go back to ER for re-evaluation after dialysis.  Discussed with ER provider and renal navigator. May benefit from Case management team's help.   OP HD orders;  MWF, [facility], 4 hours, EDW 101 kg, 450/500, 2k 2caLUE AVF. No heparin.  D. Marlea Gambill CKA.

## 2021-11-27 NOTE — ED Triage Notes (Signed)
Patient here with some shortness of breath.  He states that he is due for his dialysis today.  Patient does have some chest pain with the shortness of breath.  No nausea or vomiting.  Patient is a MWF dialysis.

## 2021-11-28 ENCOUNTER — Inpatient Hospital Stay: Payer: Medicare Other | Attending: Internal Medicine

## 2021-11-28 ENCOUNTER — Other Ambulatory Visit (HOSPITAL_COMMUNITY): Payer: Self-pay | Admitting: Interventional Radiology

## 2021-11-28 ENCOUNTER — Encounter (HOSPITAL_COMMUNITY)
Admission: RE | Admit: 2021-11-28 | Discharge: 2021-11-28 | Disposition: A | Discharge status: Home / Self Care | Payer: Medicare Other | Source: Ambulatory Visit | Attending: Interventional Radiology | Admitting: Interventional Radiology

## 2021-11-28 ENCOUNTER — Encounter (HOSPITAL_COMMUNITY): Payer: Self-pay

## 2021-11-28 ENCOUNTER — Ambulatory Visit (HOSPITAL_COMMUNITY)
Admission: RE | Admit: 2021-11-28 | Discharge: 2021-11-28 | Disposition: A | Discharge status: Home / Self Care | Payer: Medicare Other | Source: Ambulatory Visit | Attending: Interventional Radiology | Admitting: Interventional Radiology

## 2021-11-28 ENCOUNTER — Other Ambulatory Visit: Payer: Self-pay

## 2021-11-28 VITALS — BP 170/99 | HR 82 | Temp 97.8°F | Resp 18 | Ht 71.0 in | Wt 240.0 lb

## 2021-11-28 DIAGNOSIS — C220 Liver cell carcinoma: Secondary | ICD-10-CM

## 2021-11-28 DIAGNOSIS — Z89512 Acquired absence of left leg below knee: Secondary | ICD-10-CM | POA: Insufficient documentation

## 2021-11-28 DIAGNOSIS — I120 Hypertensive chronic kidney disease with stage 5 chronic kidney disease or end stage renal disease: Secondary | ICD-10-CM | POA: Diagnosis not present

## 2021-11-28 DIAGNOSIS — D649 Anemia, unspecified: Secondary | ICD-10-CM

## 2021-11-28 DIAGNOSIS — K7460 Unspecified cirrhosis of liver: Secondary | ICD-10-CM | POA: Diagnosis not present

## 2021-11-28 DIAGNOSIS — E1122 Type 2 diabetes mellitus with diabetic chronic kidney disease: Secondary | ICD-10-CM | POA: Diagnosis not present

## 2021-11-28 DIAGNOSIS — Z992 Dependence on renal dialysis: Secondary | ICD-10-CM | POA: Diagnosis not present

## 2021-11-28 DIAGNOSIS — F17210 Nicotine dependence, cigarettes, uncomplicated: Secondary | ICD-10-CM | POA: Diagnosis not present

## 2021-11-28 DIAGNOSIS — N186 End stage renal disease: Secondary | ICD-10-CM | POA: Diagnosis not present

## 2021-11-28 DIAGNOSIS — E1151 Type 2 diabetes mellitus with diabetic peripheral angiopathy without gangrene: Secondary | ICD-10-CM | POA: Insufficient documentation

## 2021-11-28 HISTORY — PX: IR ANGIOGRAM VISCERAL SELECTIVE: IMG657

## 2021-11-28 HISTORY — PX: IR EMBO TUMOR ORGAN ISCHEMIA INFARCT INC GUIDE ROADMAPPING: IMG5449

## 2021-11-28 HISTORY — PX: IR ANGIOGRAM SELECTIVE EACH ADDITIONAL VESSEL: IMG667

## 2021-11-28 HISTORY — PX: IR US GUIDE VASC ACCESS LEFT: IMG2389

## 2021-11-28 LAB — COMPREHENSIVE METABOLIC PANEL
ALT: 16 U/L (ref 0–44)
AST: 50 U/L — ABNORMAL HIGH (ref 15–41)
Albumin: 2.6 g/dL — ABNORMAL LOW (ref 3.5–5.0)
Alkaline Phosphatase: 113 U/L (ref 38–126)
Anion gap: 9 (ref 5–15)
BUN: 25 mg/dL — ABNORMAL HIGH (ref 8–23)
CO2: 27 mmol/L (ref 22–32)
Calcium: 8.2 mg/dL — ABNORMAL LOW (ref 8.9–10.3)
Chloride: 96 mmol/L — ABNORMAL LOW (ref 98–111)
Creatinine, Ser: 6.42 mg/dL — ABNORMAL HIGH (ref 0.61–1.24)
GFR, Estimated: 9 mL/min — ABNORMAL LOW (ref >60)
Glucose, Bld: 99 mg/dL (ref 70–99)
Potassium: 3.6 mmol/L (ref 3.5–5.1)
Sodium: 132 mmol/L — ABNORMAL LOW (ref 135–145)
Total Bilirubin: 1.1 mg/dL (ref 0.3–1.2)
Total Protein: 8.5 g/dL — ABNORMAL HIGH (ref 6.5–8.1)

## 2021-11-28 LAB — CBC WITH DIFFERENTIAL/PLATELET
Abs Immature Granulocytes: 0.02 10*3/uL (ref 0.00–0.07)
Basophils Absolute: 0 10*3/uL (ref 0.0–0.1)
Basophils Relative: 0 %
Eosinophils Absolute: 0 10*3/uL (ref 0.0–0.5)
Eosinophils Relative: 1 %
HCT: 22.2 % — ABNORMAL LOW (ref 39.0–52.0)
Hemoglobin: 7.5 g/dL — ABNORMAL LOW (ref 13.0–17.0)
Immature Granulocytes: 1 %
Lymphocytes Relative: 23 %
Lymphs Abs: 0.8 10*3/uL (ref 0.7–4.0)
MCH: 33.6 pg (ref 26.0–34.0)
MCHC: 33.8 g/dL (ref 30.0–36.0)
MCV: 99.6 fL (ref 80.0–100.0)
Monocytes Absolute: 0.5 10*3/uL (ref 0.1–1.0)
Monocytes Relative: 14 %
Neutro Abs: 2.2 10*3/uL (ref 1.7–7.7)
Neutrophils Relative %: 61 %
Platelets: 121 10*3/uL — ABNORMAL LOW (ref 150–400)
RBC: 2.23 MIL/uL — ABNORMAL LOW (ref 4.22–5.81)
RDW: 16.6 % — ABNORMAL HIGH (ref 11.5–15.5)
WBC: 3.6 10*3/uL — ABNORMAL LOW (ref 4.0–10.5)
nRBC: 0 % (ref 0.0–0.2)

## 2021-11-28 LAB — PROTIME-INR
INR: 1 (ref 0.8–1.2)
Prothrombin Time: 13.4 seconds (ref 11.4–15.2)

## 2021-11-28 LAB — TYPE AND SCREEN
ABO/RH(D): B POS
Antibody Screen: NEGATIVE

## 2021-11-28 MED ORDER — IOHEXOL 300 MG/ML  SOLN
100.0000 mL | Freq: Once | INTRAMUSCULAR | Status: AC | PRN
  Administered 2021-11-28: 17 mL via INTRA_ARTERIAL

## 2021-11-28 MED ORDER — MIDAZOLAM HCL 2 MG/2ML IJ SOLN
INTRAMUSCULAR | Status: AC
  Filled 2021-11-28: qty 4

## 2021-11-28 MED ORDER — VERAPAMIL HCL 2.5 MG/ML IV SOLN
INTRA_ARTERIAL | Status: AC | PRN
  Administered 2021-11-28: 6 mL via INTRA_ARTERIAL

## 2021-11-28 MED ORDER — MIDAZOLAM HCL 2 MG/2ML IJ SOLN
INTRAMUSCULAR | Status: AC | PRN
  Administered 2021-11-28: 1 mg via INTRAVENOUS

## 2021-11-28 MED ORDER — YTTRIUM 90 INJECTION: 30.5800 | INJECTION | Freq: Once | INTRAVENOUS | Status: DC | PRN

## 2021-11-28 MED ORDER — IOHEXOL 300 MG/ML  SOLN
100.0000 mL | Freq: Once | INTRAMUSCULAR | Status: AC | PRN
Start: 2021-11-28 — End: 2021-11-28
  Administered 2021-11-28: 10 mL via INTRA_ARTERIAL

## 2021-11-28 MED ORDER — LIDOCAINE HCL 1 % IJ SOLN
INTRAMUSCULAR | Status: AC
  Filled 2021-11-28: qty 20

## 2021-11-28 MED ORDER — MIDAZOLAM HCL 5 MG/5ML IJ SOLN
INTRAMUSCULAR | Status: AC | PRN
  Administered 2021-11-28: .5 mg via INTRAVENOUS

## 2021-11-28 MED ORDER — NITROGLYCERIN 2 % TD OINT
TOPICAL_OINTMENT | TRANSDERMAL | Status: AC
  Administered 2021-11-28: 1 [in_us] via TOPICAL
  Filled 2021-11-28: qty 1

## 2021-11-28 MED ORDER — FENTANYL CITRATE (PF) 100 MCG/2ML IJ SOLN
INTRAMUSCULAR | Status: AC | PRN
  Administered 2021-11-28: 50 ug via INTRAVENOUS

## 2021-11-28 MED ORDER — SODIUM CHLORIDE 0.9 % IV SOLN
8.0000 mg | Freq: Once | INTRAVENOUS | Status: AC
  Administered 2021-11-28: 8 mg via INTRAVENOUS
  Filled 2021-11-28: qty 4

## 2021-11-28 MED ORDER — SODIUM CHLORIDE 0.9 % IV SOLN: INTRAVENOUS | Status: DC

## 2021-11-28 MED ORDER — MIDAZOLAM HCL 5 MG/5ML IJ SOLN
INTRAMUSCULAR | Status: AC | PRN
  Administered 2021-11-28: 1 mg via INTRAVENOUS

## 2021-11-28 MED ORDER — MIDAZOLAM HCL 2 MG/2ML IJ SOLN
INTRAMUSCULAR | Status: AC | PRN
  Administered 2021-11-28: .5 mg via INTRAVENOUS

## 2021-11-28 MED ORDER — VERAPAMIL HCL 2.5 MG/ML IV SOLN
INTRAVENOUS | Status: AC
  Filled 2021-11-28: qty 2

## 2021-11-28 MED ORDER — DIPHENHYDRAMINE HCL 50 MG/ML IJ SOLN
INTRAMUSCULAR | Status: AC | PRN
  Administered 2021-11-28: 25 mg via INTRAVENOUS

## 2021-11-28 MED ORDER — IOHEXOL 300 MG/ML  SOLN
100.0000 mL | Freq: Once | INTRAMUSCULAR | Status: AC | PRN
  Administered 2021-11-28: 10 mL via INTRA_ARTERIAL

## 2021-11-28 MED ORDER — PANTOPRAZOLE SODIUM 40 MG IV SOLR
40.0000 mg | Freq: Once | INTRAVENOUS | Status: AC
  Administered 2021-11-28: 40 mg via INTRAVENOUS
  Filled 2021-11-28: qty 10

## 2021-11-28 MED ORDER — LIDOCAINE-PRILOCAINE 2.5-2.5 % EX CREA
TOPICAL_CREAM | Freq: Once | CUTANEOUS | Status: AC
  Filled 2021-11-28: qty 5

## 2021-11-28 MED ORDER — FENTANYL CITRATE (PF) 100 MCG/2ML IJ SOLN
INTRAMUSCULAR | Status: AC
  Filled 2021-11-28: qty 2

## 2021-11-28 MED ORDER — DEXAMETHASONE SODIUM PHOSPHATE 10 MG/ML IJ SOLN
8.0000 mg | Freq: Once | INTRAMUSCULAR | Status: AC
  Administered 2021-11-28: 8 mg via INTRAVENOUS
  Filled 2021-11-28: qty 1

## 2021-11-28 MED ORDER — DIPHENHYDRAMINE HCL 50 MG/ML IJ SOLN
INTRAMUSCULAR | Status: AC
  Filled 2021-11-28: qty 1

## 2021-11-28 MED ORDER — SODIUM CHLORIDE 0.9 % IV SOLN
2.0000 g | Freq: Once | INTRAVENOUS | Status: AC
  Administered 2021-11-28: 2 g via INTRAVENOUS
  Filled 2021-11-28: qty 2

## 2021-11-28 MED ORDER — NITROGLYCERIN IN D5W 100-5 MCG/ML-% IV SOLN
INTRAVENOUS | Status: AC
  Filled 2021-11-28: qty 250

## 2021-11-28 MED ORDER — HEPARIN SODIUM (PORCINE) 1000 UNIT/ML IJ SOLN
INTRAMUSCULAR | Status: AC
  Filled 2021-11-28: qty 10

## 2021-11-28 MED ORDER — NITROGLYCERIN 2 % TD OINT
1.0000 [in_us] | TOPICAL_OINTMENT | Freq: Once | TRANSDERMAL | Status: AC
Start: 2021-11-28 — End: 2021-11-28

## 2021-11-28 NOTE — Procedures (Signed)
Interventional Radiology Procedure Note  Procedure: Transarterial radioembolization  Findings: Please refer to procedural dictation for full description. Right superior hepatic artery TARE.  Left radial access, TR band closure.  Complications: None immediate  Estimated Blood Loss:  < 5 mL  Recommendations: TR band protocol - 13 mL air at 11:05. Discharge 1 hour after TR band removed. Follow up in IR clinic in 1 month, no imaging at this time (will wait until 3 months post procedure for MR abdomen).   Ruthann Cancer, MD

## 2021-11-28 NOTE — Discharge Instructions (Addendum)
Discharge Instructions:   Please call Interventional Radiology clinic [phone] with any questions or concerns.  You may remove your dressing and shower tomorrow.   Moderate Conscious Sedation, Adult, Care After This sheet gives you information about how to care for yourself after your procedure. Your health care provider may also give you more specific instructions. If you have problems or questions, contact your health care provider. What can I expect after the procedure? After the procedure, it is common to have: Sleepiness for several hours. Impaired judgment for several hours. Difficulty with balance. Vomiting if you eat too soon. Follow these instructions at home: For the time period you were told by your health care provider:     Rest. Do not participate in activities where you could fall or become injured. Do not drive or use machinery. Do not drink alcohol. Do not take sleeping pills or medicines that cause drowsiness. Do not make important decisions or sign legal documents. Do not take care of children on your own. Eating and drinking  Follow the diet recommended by your health care provider. Drink enough fluid to keep your urine pale yellow. If you vomit: Drink water, juice, or soup when you can drink without vomiting. Make sure you have little or no nausea before eating solid foods. General instructions Take over-the-counter and prescription medicines only as told by your health care provider. Have a responsible adult stay with you for the time you are told. It is important to have someone help care for you until you are awake and alert. Do not smoke. Keep all follow-up visits as told by your health care provider. This is important. Contact a health care provider if: You are still sleepy or having trouble with balance after 24 hours. You feel light-headed. You keep feeling nauseous or you keep vomiting. You develop a rash. You have a fever. You have redness  or swelling around the IV site. Get help right away if: You have trouble breathing. You have new-onset confusion at home. Summary After the procedure, it is common to feel sleepy, have impaired judgment, or feel nauseous if you eat too soon. Rest after you get home. Know the things you should not do after the procedure. Follow the diet recommended by your health care provider and drink enough fluid to keep your urine pale yellow. Get help right away if you have trouble breathing or new-onset confusion at home. This information is not intended to replace advice given to you by your health care provider. Make sure you discuss any questions you have with your health care provider. Document Revised: 08/12/2019 Document Reviewed: 03/10/2019 Elsevier Patient Education  [place].    Please call Interventional Radiology clinic [phone] with any questions or concerns.  You may remove your dressing and shower tomorrow.    Hepatic Artery Radioembolization, Care After The following information offers guidance on how to care for yourself after your procedure. Your health care provider may also give you more specific instructions. If you have problems or questions, contact your health care provider. What can I expect after the procedure? After the procedure, it is possible to have: A slight fever for 7 to 10 days. This may be accompanied by pain, nausea, or vomiting, which is referred to as post-embolization syndrome. You may be given medicine to help relieve these symptoms. If your fever gets worse, tell your health care provider. Tiredness (fatigue). Loss of appetite. This should gradually improve after about 1 week. Abdominal pain on your right side.  Soreness and tenderness in your groin area where the needle and catheter were placed (puncture site). Follow these instructions at home: Puncture site care Follow instructions from your health care provider about how to take care of the  puncture site. Make sure you: Wash your hands with soap and water for at least 20 seconds before and after you change your bandage (dressing). If soap and water are not available, use hand sanitizer. Change your dressing as told by your health care provider. Check your puncture site every day for signs of infection. Check for: More redness, swelling, or pain. Fluid or blood. Warmth. Pus or a bad smell. Activity Rest as told by your health care provider. Return to your normal activities as told by your health care provider. Ask your health care provider what activities are safe for you. Avoid sitting for a long time without moving. Get up to take short walks every 1-2 hours. This is important to improve blood flow and breathing. Ask for help if you feel weak or unsteady. If you were given a sedative during the procedure, it can affect you for several hours. Do not drive or operate machinery until your health care provider says that it is safe. Do not lift anything that is heavier than 10 lb (4.5 kg), or the limit that you are told, until your health care provider says that it is safe. Medicines Take over-the-counter and prescription medicines only as told by your health care provider. Ask your health care provider if the medicine prescribed to you: Requires you to avoid driving or using machinery. Can cause constipation. You may need to take these actions to prevent or treat constipation: Drink enough fluid to keep your urine pale yellow. Take over-the-counter or prescription medicines. Eat foods that are high in fiber, such as beans, whole grains, and fresh fruits and vegetables. Limit foods that are high in fat and processed sugars, such as fried or sweet foods. General instructions Eat frequent, small meals until your appetite returns. Follow instructions from your health care provider about eating or drinking restrictions. Do not take baths, swim, or use a hot tub until your health care  provider approves. You may take showers. Wash your puncture site with mild soap and water, and pat the area dry. Wear compression stockings as told by your health care provider. These stockings help to prevent blood clots and reduce swelling in your legs. Keep all follow-up visits. This is important. You may need to have blood tests and imaging tests. Contact a health care provider if: You have any of these signs of infection: More redness, swelling, or pain around your puncture site. Fluid or blood coming from your puncture site. Warmth coming from your puncture site. Pus or a bad smell coming from your puncture site. You have pain that: Gets worse. Does not get better with medicine. Feels like very bad heartburn. Is in the middle of your abdomen, above your belly button. You have any signs of infection or liver failure, such as: Your skin or the white parts of your eyes turn yellow (jaundice). The color of your urine changes to dark brown. The color of your stool (feces) changes to light yellow. Your abdominal measurement (girth) increases in a short period of time. You gain more than 5 lb (2.3 kg) in a short period of time. Get help right away if: You have a fever that lasts more than 10 days or is higher than what your health care provider told you to expect.  You develop any of the following in your legs: Pain. Swelling. Skin that is cold or pale or turns blue. You have chest pain. You have blood in your vomit, saliva, or stool. You have trouble breathing. These symptoms may represent a serious problem that is an emergency. Do not wait to see if the symptoms will go away. Get medical help right away. Call your local emergency services (911 in the [place] Do not drive yourself to the hospital. Summary After the procedure, it is possible to have a slight fever for up to 7-10 days, tiredness, loss of appetite, abdominal pain on the right side, and groin tenderness where the catheter  was placed. Do not come in close contact with people for up to a week after your procedure, as told by your health care provider. Follow instructions from your health care provider about how to take care of the puncture site. Contact a health care provider if you have any signs of infection. Get help right away if you develop pain or swelling in your legs or if your legs feel cool or look pale. This information is not intended to replace advice given to you by your health care provider. Make sure you discuss any questions you have with your health care provider. Document Revised: 03/18/2020 Document Reviewed: 03/18/2020 Elsevier Patient Education  2022 [place]:  For up to a week after your procedure, there will be a small amount of radioactivity near your liver. This is not especially dangerous to other people. However, as told by your health care provider, you should follow these precautions for 7 days: Do not come in close contact with people. Do not sleep in the same bed as someone else. Do not hold children or babies. Do not have contact with pregnant women.  Post Y-90 Radioembolization Discharge Instructions  You have been given a radioactive material during your procedure.  While it is safe for you to be discharged home from the hospital, you need to proceed directly home.    Do not use public transportation, including air travel, lasting more than 2 hours for 1 week.  Avoid crowded public places for 1 week.  Adult visitors should try to avoid close contact with you for 1 week.    Children and pregnant females should not visit or have close contact with you for 1 week.  Items that you touch are not radioactive.  Do not sleep in the same bed as your partner for 1 week, and a condom should be used for sexual activity during the first 24 hours.  Your blood may be radioactive and caution should be used if any bleeding occurs during the recovery  period.  Body fluids may be radioactive for 24 hours.  Wash your hands after voiding.  Men should sit to urinate.  Dispose of any soiled materials (flush down toilet or place in trash at home) during the first day.  Drink 6 to 8 glasses of fluids per day for 5 days to hydrate yourself.  If you need to see a doctor during the first week, you must let them know that you were treated with yttrium-90 microspheres, and will be slightly radioactive.  They can call Interventional Radiology [phone] with any questions.

## 2021-11-28 NOTE — H&P (Signed)
Chief Complaint: Patient was seen in consultation today for multifocal hepatocellular carcinoma   Referring Physician(s): Daniel [name]  Supervising Physician: Ruthann Cancer  Patient Status: Daniel [name] - Out-pt  History of Present Illness: Daniel [name] is a 61 y.o. male with PMH significant for anemia, diabetes, nephrolithiasis, cholelithiasis, ESRD, hypertension, intracerebral hemorrhage in 2019, peripheral artery disease with left BKA, hepatitis C, and cirrhosis. Patient has multifocal hepatocellular carcinoma and previously met with Dr Serafina Royals on 09/12/21 to discuss treatment options. Y90 treatment was determined to be the course forward, and patient underwent pre-Y90 arterial roadmapping on 11/07/21.    Past Medical History:  Diagnosis Date   Anemia    Diabetes mellitus without complication [facility])    patient denies   Dialysis patient [facility])    End stage chronic kidney disease ([place])    Hypertension    ICH (intracerebral hemorrhage) ([place]) 05/20/2017   PAD (peripheral artery disease) ([facility])    Shoulder pain, left 06/28/2013    Past Surgical History:  Procedure Laterality Date   A/V FISTULAGRAM N/A 08/15/2020   Procedure: A/V FISTULAGRAM - Left Upper;  Surgeon: Cherre Robins, MD;  Location: [place] [facility];  Service: Cardiovascular;  Laterality: N/A;   ABDOMINAL AORTOGRAM W/LOWER EXTREMITY N/A 04/08/2021   Procedure: ABDOMINAL AORTOGRAM W/LOWER EXTREMITY;  Surgeon: Waynetta Sandy, MD;  Location: [place] [facility];  Service: Cardiovascular;  Laterality: N/A;   AMPUTATION Left 04/16/2021   Procedure: LEFT BELOW KNEE AMPUTATION;  Surgeon: Angelia Mould, MD;  Location: [place];  Service: Vascular;  Laterality: Left;   AMPUTATION Left 06/17/2021   Procedure: REVISION AMPUTATION BELOW KNEE;  Surgeon: Broadus John, MD;  Location: [place];  Service: Vascular;  Laterality: Left;   APPENDECTOMY     APPLICATION OF WOUND VAC Left 06/17/2021   Procedure: APPLICATION OF  WOUND VAC;  Surgeon: Broadus John, MD;  Location: [place];  Service: Vascular;  Laterality: Left;   AV FISTULA PLACEMENT Left 08/03/2019   Procedure: LEFT ARM ARTERIOVENOUS (AV) CEPHALIC  FISTULA CREATION;  Surgeon: Waynetta Sandy, MD;  Location: [place];  Service: Vascular;  Laterality: Left;   BIOPSY  06/30/2019   Procedure: BIOPSY;  Surgeon: Ronald Lobo, MD;  Location: [facility] ENDOSCOPY;  Service: Endoscopy;;   BIOPSY  08/02/2019   Procedure: BIOPSY;  Surgeon: Yetta Flock, MD;  Location: [facility] ENDOSCOPY;  Service: Gastroenterology;;   BIOPSY  02/08/2021   Procedure: BIOPSY;  Surgeon: Sharyn Creamer, MD;  Location: [name];  Service: Gastroenterology;;   BIOPSY  02/24/2021   Procedure: BIOPSY;  Surgeon: [name], MD;  Location: [facility] ENDOSCOPY;  Service: Gastroenterology;;   BIOPSY  03/26/2021   Procedure: BIOPSY;  Surgeon: Lavena Bullion, DO;  Location: [place];  Service: Gastroenterology;;   BIOPSY  08/06/2021   Procedure: BIOPSY;  Surgeon: [name], MD;  Location: [place];  Service: Gastroenterology;;   BIOPSY  09/15/2021   Procedure: BIOPSY;  Surgeon: Sharyn Creamer, MD;  Location: [place];  Service: Gastroenterology;;   COLONOSCOPY  01/23/2012   Procedure: COLONOSCOPY;  Surgeon: Danie Binder, MD;  [facility];  Service: Endoscopy;  Laterality: N/A;  11:10 AM   COLONOSCOPY WITH PROPOFOL N/A 06/30/2019   Procedure: COLONOSCOPY WITH PROPOFOL;  Surgeon: Ronald Lobo, MD;  Location: [place];  Service: Endoscopy;  Laterality: N/A;   ENTEROSCOPY N/A 08/02/2019   Procedure: ENTEROSCOPY;  Surgeon: Yetta Flock, MD;  Location: [name];  Service: Gastroenterology;  Laterality: N/A;   ENTEROSCOPY N/A 02/24/2021   Procedure: ENTEROSCOPY;  Surgeon: [name], MD;  Location: [facility] ENDOSCOPY;  Service: Gastroenterology;  Laterality: N/A;   ENTEROSCOPY N/A 03/26/2021   Procedure: ENTEROSCOPY;  Surgeon: Lavena Bullion, DO;  Location: [place];  Service: Gastroenterology;  Laterality: N/A;   ENTEROSCOPY N/A 07/05/2021   Procedure: ENTEROSCOPY;  Surgeon: Carol Ada, MD;  Location: [place];  Service: Gastroenterology;  Laterality: N/A;   ENTEROSCOPY N/A 08/06/2021   Procedure: ENTEROSCOPY;  Surgeon: [name], MD;  Location: [facility] ENDOSCOPY;  Service: Gastroenterology;  Laterality: N/A;   ENTEROSCOPY N/A 08/24/2021   Procedure: ENTEROSCOPY;  Surgeon: Ladene Artist, MD;  Location: [facility] ENDOSCOPY;  Service: Gastroenterology;  Laterality: N/A;   ENTEROSCOPY N/A 09/15/2021   Procedure: ENTEROSCOPY;  Surgeon: Sharyn Creamer, MD;  Location: [facility] ENDOSCOPY;  Service: Gastroenterology;  Laterality: N/A;   ENTEROSCOPY N/A 10/06/2021   Procedure: ENTEROSCOPY;  Surgeon: [name], MD;  Location: [facility] ENDOSCOPY;  Service: Gastroenterology;  Laterality: N/A;   ENTEROSCOPY N/A 11/14/2021   Procedure: ENTEROSCOPY;  Surgeon: Doran Stabler, MD;  Location: [facility] ENDOSCOPY;  Service: Gastroenterology;  Laterality: N/A;   ESOPHAGOGASTRODUODENOSCOPY N/A 08/10/2020   Procedure: ESOPHAGOGASTRODUODENOSCOPY (EGD);  Surgeon: Jerene [place], MD;  Location: [name];  Service: Gastroenterology;  Laterality: N/A;   ESOPHAGOGASTRODUODENOSCOPY (EGD) WITH PROPOFOL N/A 06/30/2019   Procedure: ESOPHAGOGASTRODUODENOSCOPY (EGD) WITH PROPOFOL;  Surgeon: Ronald Lobo, MD;  Location: [place];  Service: Endoscopy;  Laterality: N/A;   ESOPHAGOGASTRODUODENOSCOPY (EGD) WITH PROPOFOL N/A 01/12/2021   Procedure: ESOPHAGOGASTRODUODENOSCOPY (EGD) WITH PROPOFOL;  Surgeon: Sharyn Creamer, MD;  Location: [place];  Service: Gastroenterology;  Laterality: N/A;   ESOPHAGOGASTRODUODENOSCOPY (EGD) WITH PROPOFOL N/A 02/08/2021   Procedure: ESOPHAGOGASTRODUODENOSCOPY (EGD) WITH PROPOFOL;  Surgeon: Sharyn Creamer, MD;  Location: [name];  Service: Gastroenterology;  Laterality: N/A;   FISTULA SUPERFICIALIZATION Left  10/17/2019   Procedure: LEFT UPPER EXTREMITY FISTULA REVISION, SIDE BRANCH LIGATION,  AND SUPERFICIALIZATION;  Surgeon: Marty Heck, MD;  Location: [place];  Service: Vascular;  Laterality: Left;   FISTULA SUPERFICIALIZATION Left 63/87/5643   Procedure: PLICATION OF ANEURYSM LEFT ARTERIOVENOUS FISTULA;  Surgeon: Angelia Mould, MD;  Location: [place];  Service: Vascular;  Laterality: Left;   FLEXIBLE SIGMOIDOSCOPY N/A 07/05/2021   Procedure: FLEXIBLE SIGMOIDOSCOPY;  Surgeon: Carol Ada, MD;  Location: [place];  Service: Gastroenterology;  Laterality: N/A;   GIVENS CAPSULE STUDY N/A 06/30/2019   Procedure: GIVENS CAPSULE STUDY;  Surgeon: Ronald Lobo, MD;  Location: [place];  Service: Endoscopy;  Laterality: N/A;   GIVENS CAPSULE STUDY N/A 08/25/2021   Procedure: GIVENS CAPSULE STUDY;  Surgeon: Ladene Artist, MD;  Location: [place];  Service: Gastroenterology;  Laterality: N/A;   GIVENS CAPSULE STUDY N/A 10/06/2021   Procedure: GIVENS CAPSULE STUDY;  Surgeon: [name], MD;  Location: [place];  Service: Gastroenterology;  Laterality: N/A;   HEMOSTASIS CLIP PLACEMENT  08/10/2020   Procedure: HEMOSTASIS CLIP PLACEMENT;  Surgeon: [place], MD;  Location: [place];  Service: Gastroenterology;;   HEMOSTASIS CLIP PLACEMENT  01/12/2021   Procedure: HEMOSTASIS CLIP PLACEMENT;  Surgeon: Sharyn Creamer, MD;  Location: [place];  Service: Gastroenterology;;   HEMOSTASIS CONTROL  08/02/2019   Procedure: HEMOSTASIS CONTROL;  Surgeon: Yetta Flock, MD;  Location: [place];  Service: Gastroenterology;;   HOT HEMOSTASIS  02/24/2021   Procedure: HOT HEMOSTASIS (ARGON PLASMA COAGULATION/BICAP);  Surgeon: [name], MD;  Location: [name]  END[facility]COPY;  Service: Gastroenterology;;   HOT HEMOSTASIS N/A 03/26/2021   Procedure: HOT HEMOSTASIS (ARGON PLASMA COAGULATION/BICAP);  Surgeon: Lavena Bullion, DO;  Location: [name];  Service:  Gastroenterology;  Laterality: N/A;   HOT HEMOSTASIS N/A 07/05/2021   Procedure: HOT HEMOSTASIS (ARGON PLASMA COAGULATION/BICAP);  Surgeon: Carol Ada, MD;  Location: [place];  Service: Gastroenterology;  Laterality: N/A;   HOT HEMOSTASIS N/A 09/15/2021   Procedure: HOT HEMOSTASIS (ARGON PLASMA COAGULATION/BICAP);  Surgeon: Sharyn Creamer, MD;  Location: [place];  Service: Gastroenterology;  Laterality: N/A;   HOT HEMOSTASIS N/A 10/06/2021   Procedure: HOT HEMOSTASIS (ARGON PLASMA COAGULATION/BICAP);  Surgeon: [name], MD;  Location: [place];  Service: Gastroenterology;  Laterality: N/A;   INCISION AND DRAINAGE ABSCESS N/A 06/29/2016   Procedure: INCISION AND DRAINAGE ABDOMINAL WALL ABSCESS;  Surgeon: Alphonsa Overall, MD;  Location: [facility];  Service: General;  Laterality: N/A;   INSERTION OF DIALYSIS CATHETER Right 08/03/2019   Procedure: INSERTION OF DIALYSIS CATHETER;  Surgeon: Waynetta Sandy, MD;  Location: [place];  Service: Vascular;  Laterality: Right;   INSERTION OF DIALYSIS CATHETER Right 10/22/2019   Procedure: INSERTION OF 23CM TUNNELED DIALYSIS CATHETER RIGHT INTERNAL JUGULAR;  Surgeon: Angelia Mould, MD;  Location: [place];  Service: Vascular;  Laterality: Right;   INSERTION OF DIALYSIS CATHETER Right 08/12/2020   Procedure: INSERTION OF Right internal Jugular TUNNELED  DIALYSIS CATHETER.;  Surgeon: Waynetta Sandy, MD;  Location: [place];  Service: Vascular;  Laterality: Right;   INSERTION OF DIALYSIS CATHETER N/A 04/19/2021   Procedure: INSERTION OF TUNNELED DIALYSIS CATHETER;  Surgeon: Angelia Mould, MD;  Location: [place];  Service: Vascular;  Laterality: N/A;   IR 3D INDEPENDENT WKST  11/07/2021   IR ANGIOGRAM SELECTIVE EACH ADDITIONAL VESSEL  11/07/2021   IR ANGIOGRAM SELECTIVE EACH ADDITIONAL VESSEL  11/07/2021   IR ANGIOGRAM SELECTIVE EACH ADDITIONAL VESSEL  11/07/2021   IR ANGIOGRAM VISCERAL SELECTIVE  11/07/2021   IR EMBO  ARTERIAL NOT HEMORR HEMANG INC GUIDE ROADMAPPING  11/07/2021   IR RADIOLOGIST EVAL & MGMT  09/12/2021   IR US GUIDE VASC ACCESS LEFT  11/07/2021   Left heel surgery     LOWER EXTREMITY ANGIOGRAPHY N/A 07/08/2021   Procedure: Lower Extremity Angiography;  Surgeon: Cherre Robins, MD;  Location: [place] [facility];  Service: Cardiovascular;  Laterality: N/A;   PENILE BIOPSY N/A 03/26/2020   Procedure: PENILE ULCER DEBRIDEMENT;  Surgeon: Remi Haggard, MD;  Location: [facility];  Service: Urology;  Laterality: N/A;  30 MINS   PERIPHERAL VASCULAR INTERVENTION Right 07/08/2021   Procedure: PERIPHERAL VASCULAR INTERVENTION;  Surgeon: Cherre Robins, MD;  Location: [place] [facility];  Service: Cardiovascular;  Laterality: Right;   SCLEROTHERAPY  01/12/2021   Procedure: SCLEROTHERAPY;  Surgeon: Sharyn Creamer, MD;  Location: [facility] ENDOSCOPY;  Service: Gastroenterology;;   Clide Deutscher  02/24/2021   Procedure: Clide Deutscher;  Surgeon: [name], MD;  Location: [place];  Service: Gastroenterology;;   SUBMUCOSAL TATTOO INJECTION  08/24/2021   Procedure: SUBMUCOSAL TATTOO INJECTION;  Surgeon: Ladene Artist, MD;  Location: [place];  Service: Gastroenterology;;   SUBMUCOSAL TATTOO INJECTION  09/15/2021   Procedure: SUBMUCOSAL TATTOO INJECTION;  Surgeon: Sharyn Creamer, MD;  Location: [name];  Service: Gastroenterology;;    Allergies: Seroquel [quetiapine] and Dilaudid [hydromorphone hcl]  Medications: Prior to Admission medications   Medication Sig Start Date End Date Taking? Authorizing Provider  amLODipine (NORVASC) 5 MG tablet Take  1 tablet (5 mg total) by mouth daily. 09/18/21 12/17/21 Yes [place] Eric J, DO  atorvastatin (LIPITOR) 40 MG tablet Take 1 tablet (40 mg total) by mouth daily. 09/18/21 12/17/21 Yes [place] Eric J, DO  gabapentin (NEURONTIN) 300 MG capsule Take 1 capsule (300 mg total) by mouth at bedtime. 11/15/21  Yes Nita Sells, MD  pantoprazole  (PROTONIX) 40 MG tablet Take 1 tablet (40 mg total) by mouth 2 (two) times daily before a meal. 10/10/21 01/08/22 Yes Mercy Riding, MD  traZODone (DESYREL) 50 MG tablet Take 1 tablet (50 mg total) by mouth at bedtime as needed for sleep. 11/15/21  Yes Nita Sells, MD  colchicine 0.6 MG tablet Take 0.5 tablets (0.3 mg total) by mouth 2 (two) times daily. 07/24/20 07/29/20  Noemi Chapel, MD  furosemide (LASIX) 40 MG tablet Take 1 tablet (40 mg total) by mouth daily. 07/25/19 02/09/20  Ladona Horns, MD     Family History  Problem Relation Age of Onset   Colon cancer Neg Hx     Social History   Socioeconomic History   Marital status: Single    Spouse name: Not on file   Number of children: 2   Years of education: 89   Highest education level: 12th grade  Occupational History   Occupation: disabled  Tobacco Use   Smoking status: Every Day    Packs/day: 0.50    Years: 45.00    Total pack years: 22.50    Types: Cigarettes   Smokeless tobacco: Never   Tobacco comments:    Pt left before information given  Vaping Use   Vaping Use: Never used  Substance and Sexual Activity   Alcohol use: Not Currently   Drug use: Not Currently    Types: "Crack" cocaine    Comment: last in 2020   Sexual activity: Yes    Birth control/protection: None  Other Topics Concern   Not on file  Social History Narrative   Goes to dialysis M-W-F, [facility] using a wheelchair at present, waiting for prosthetic. Looking for handicapped housing for immediate move in.   Social Determinants of Health   Financial Resource Strain: High Risk (11/01/2021)   Overall Financial Resource Strain (CARDIA)    Difficulty of Paying Living Expenses: Hard  Food Insecurity: No Food Insecurity (11/01/2021)   Hunger Vital Sign    Worried About Running Out of Food in the Last Year: Never true    Ran Out of Food in the Last Year: Never true  Transportation Needs: No Transportation Needs (11/01/2021)   PRAPARE - Armed forces logistics/support/administrative officer (Medical): No    Lack of Transportation (Non-Medical): No  Physical Activity: Insufficiently Active (11/01/2021)   Exercise Vital Sign    Days of Exercise per Week: 1 day    Minutes of Exercise per Session: 20 min  Stress: No Stress Concern Present (11/01/2021)   [place]    Feeling of Stress : Not at all  Social Connections: Socially Isolated (11/01/2021)   Social Connection and Isolation Panel [NHANES]    Frequency of Communication with Friends and Family: More than three times a week    Frequency of Social Gatherings with Friends and Family: More than three times a week    Attends Religious Services: Never    Marine scientist or Organizations: No    Attends Archivist Meetings: Never    Marital Status: Divorced  Review of Systems: A 12 point ROS discussed and pertinent positives are indicated in the HPI above.  All other systems are negative.  Review of Systems  Constitutional:  Negative for chills and fever.  Respiratory:  Negative for chest tightness and shortness of breath.   Cardiovascular:  Positive for leg swelling. Negative for chest pain.  Gastrointestinal:  Positive for diarrhea. Negative for abdominal pain, nausea and vomiting.  Neurological:  Negative for dizziness and headaches.  Psychiatric/Behavioral:  Negative for confusion.     Vital Signs: BP (!) 150/81 (BP Location: Right Arm)   Pulse 86   Temp 98.5 F (36.9 C) (Oral)   Resp 15   Ht 5' 11"  (1.803 m)   Wt 240 lb (108.9 kg)   BMI 33.47 kg/m     Physical Exam Vitals reviewed.  Constitutional:      General: He is not in acute distress. HENT:     Mouth/Throat:     Mouth: Mucous membranes are moist.  Cardiovascular:     Rate and Rhythm: Normal rate and regular rhythm.     Pulses: Normal pulses.     Heart sounds: Normal heart sounds.  Pulmonary:     Effort: Pulmonary effort is normal.      Breath sounds: Normal breath sounds.  Abdominal:     General: Bowel sounds are normal.     Palpations: Abdomen is soft.     Tenderness: There is no abdominal tenderness.  Musculoskeletal:     Right lower leg: Edema present.     Comments: 1+ pitting edema in RLE, BKA on left  Skin:    General: Skin is warm and dry.  Neurological:     Mental Status: He is alert and oriented to person, place, and time.  Psychiatric:        Mood and Affect: Mood normal.        Behavior: Behavior normal.        Thought Content: Thought content normal.        Judgment: Judgment normal.     Imaging: DG Chest Portable 1 View  Result Date: 11/27/2021 CLINICAL DATA:  Missed dialysis EXAM: PORTABLE CHEST 1 VIEW COMPARISON:  11/26/2021 FINDINGS: Hazy mid and lower chest opacification with distinct ovoid density at the right base which is likely fissural fluid based on June 2023 chest CT. No interstitial edema, significant effusion, or pneumothorax. Chronic cardiomegaly. Extensive artifact from EKG leads. IMPRESSION: Unchanged cardiomegaly and pulmonary opacity. Electronically Signed   By: Jorje Guild M.D.   On: 11/27/2021 07:07   DG Chest 2 View  Result Date: 11/26/2021 CLINICAL DATA:  Shortness of breath. EXAM: CHEST - 2 VIEW COMPARISON:  11/23/2021. FINDINGS: The heart is enlarged and the mediastinal contour is stable. Atherosclerotic calcification of the aorta is noted. Perihilar airspace disease is noted bilaterally. There is blunting of the right costophrenic angle suggesting small pleural effusion. No pneumothorax. Degenerative changes are present in the thoracic spine. A vascular stent is noted in the left axilla. IMPRESSION: 1. Cardiomegaly. 2. Patchy perihilar airspace disease bilaterally suggesting edema, less likely infection. 3. Small right pleural effusion. Electronically Signed   By: Brett Fairy M.D.   On: 11/26/2021 04:46   DG Chest 2 View  Result Date: 11/23/2021 CLINICAL DATA:  A 61 year old  male presents for evaluation of shortness of breath and chest pain for 2 days. EXAM: CHEST - 2 VIEW COMPARISON:  November 07, 2021 in November 08, 2021 FINDINGS: Signs of vascular stenting over the LEFT  axilla similar to previous imaging. Trachea midline. Cardiomediastinal contours and hilar structures are stable with moderate to marked cardiomegaly. Global increased interstitial markings in the chest compared to previous imaging. Small effusions suggested on lateral projection. No sign of lobar consolidative changes. Patchy opacities in the LEFT mid and RIGHT mid chest persists as seen on previous CT imaging with increase of an overlay of interstitial prominence. IMPRESSION: Moderate to marked cardiomegaly with signs of interstitial edema and small effusions. Findings most suggestive of heart failure or volume overload worsening since previous imaging. Persistent airspace/alveolar process in the mid chest bilaterally could reflect asymmetric edema or atypical infection in the background has been present, over a series of prior studies. Electronically Signed   By: Zetta Bills M.D.   On: 11/23/2021 08:36   NM PRE Y90 LIVER SPECT LUNG SHUNT ASSESSMENT  Result Date: 11/11/2021 CLINICAL DATA:  Pre Y-90 mapping. Unresectable hepatocellular carcinoma. EXAM: NUCLEAR MEDICINE LIVER SCAN TECHNIQUE: Abdominal images were obtained in multiple projections after intrahepatic arterial injection of radiopharmaceutical. SPECT imaging was performed. Lung shunt calculation was performed. RADIOPHARMACEUTICALS:  4.81mllicurie MAA TECHNETIUM TO 55M ALBUMIN AGGREGATED COMPARISON:  CT 10/09/2021 FINDINGS: The injected microaggregated albumin localizes within the liver. No evidence of activity within the stomach, duodenum, or bowel. Calculated shunt fraction to the lungs equals 8.5%. IMPRESSION: 1. No significant extrahepatic radiotracer activity following intrahepatic arterial injection of MAA. 2. Lung shunt fraction equals 8.5%  Electronically Signed   By: TKerby MoorsM.D.   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 EXAM: 1. Ultrasound-guided access of the left radial artery 2. Catheterization and angiography of the celiac, proper hepatic, and right and left hepatic arteries 3. Cone beam CT 4. MAA injection into the right superior hepatic artery MEDICATIONS: None. ANESTHESIA/SEDATION: Moderate (conscious) sedation was employed during this procedure. A total of Versed 4 mg and Fentanyl 200 mcg was administered intravenously. Moderate Sedation Time: 75 minutes. The patient's level of consciousness and vital signs were monitored continuously by radiology nursing throughout the procedure under my direct supervision. CONTRAST:  676mOMNIPAQUE IOHEXOL 300 MG/ML SOLN, 6340mMNIPAQUE IOHEXOL 300 MG/ML SOLN, 75m28mNIPAQUE IOHEXOL 300 MG/ML SOLN FLUOROSCOPY TIME:  [id number] COMPLICATIONS: None immediate. PROCEDURE: Informed consent was obtained from the patient following explanation of the procedure, risks, benefits and alternatives. The patient understands, agrees and consents for the procedure. All questions were addressed. A time out was performed prior to the initiation of the procedure. Maximal barrier sterile technique utilized including caps, mask, sterile gowns, sterile gloves, large sterile drape, hand hygiene, and Betadine prep. A preliminary ultrasound of the left wrist was performed and demonstrates a patent left radial artery which measured up to 0.44 cm in diameter. Barbeau A. A permanent ultrasound image was recorded. Using a combination of fluoroscopy and ultrasound, an access site was determined. The overlying skin was anesthetized with 1% Lidocaine. Using ultrasound guidance, access into the left radial artery was obtained with  visualization of needle entry into the vessel using standard micropuncture technique. Five French slender sheath was placed. Radial cocktail was administered as per protocol. An MG1 catheter was advanced into the celiac artery. Celiac angiography demonstrates conventional anatomy. The portal vein is patent with hepatopedal flow. A Progreat Omega microcatheter and Fathom 16 microwire were used to select the proper hepatic artery and  angiography was performed in multiple obliquities, demonstrating a hypervascular mass in the right lobe, consistent with hepatocellular carcinoma. The microcatheter and wire were then used to select the right superior hepatic artery and angiogram was repeated. Angiography demonstrates supply to the targeted mass. There are at least 3 branches arising from the proximal right superior hepatic artery the directly supply the mass. Opacification of the satellite, smaller hepatoma is also seen. There is no evidence of non-target extra-hepatic branches. Cone beam CT was performed, demonstrating peripherally enhancing mass in segment 8 measuring up to approximately 5.1 cm in maximum axial diameter. Given proximity to the central aspect of the liver, the left hepatic artery was then selected for angiogram. Left hepatic angiogram demonstrated patent vessels with indeterminate supply to the right-sided mass. Sub selection of the segment 4 hepatic artery was performed. Cone beam CT demonstrates no definite opacification of the segment 8 mass from the segment 4 arteries. The catheter was then repositioned into the right superior hepatic artery. Per protocol, MAA was then infused into the right superior hepatic artery. All wires and catheters were removed. Hemostasis was achieved using a TR band. There were no immediate complications. Peripheral pulses were unchanged1. The patient was transferred to the recovery area in stable condition. IMPRESSION: 1. Visceral angiography demonstrating two  hypervascular masses in the right hepatic lobe, consistent with hepatocellular carcinoma. 2. MAA infusion into the right superior hepatic artery. 3. We will follow up shunt fraction calculations and calculate liver volumes in preparation for radioembolization therapy. Ruthann Cancer, MD [facility] Electronically Signed   By: Ruthann Cancer M.D.   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 EXAM: 1. Ultrasound-guided access of the left radial artery 2. Catheterization and angiography of the celiac, proper hepatic, and right and left hepatic arteries 3. Cone beam CT 4. MAA injection into the right superior hepatic artery MEDICATIONS: None. ANESTHESIA/SEDATION: Moderate (conscious) sedation was employed during this procedure. A total of Versed 4 mg and Fentanyl 200 mcg was administered intravenously. Moderate Sedation Time: 75 minutes. The patient's level of consciousness and vital signs were monitored continuously by radiology nursing throughout the procedure under my direct supervision. CONTRAST:  16m OMNIPAQUE IOHEXOL 300 MG/ML SOLN, 636mOMNIPAQUE IOHEXOL 300 MG/ML SOLN, 6466mMNIPAQUE IOHEXOL 300 MG/ML SOLN FLUOROSCOPY TIME:  1.97.124y COMPLICATIONS: None immediate. PROCEDURE: Informed consent was obtained from the patient following explanation of the procedure, risks, benefits and alternatives. The patient understands, agrees and consents for the procedure. All questions were addressed. A time out was performed prior to the initiation of the procedure. Maximal barrier sterile technique utilized including caps, mask, sterile gowns, sterile gloves, large sterile drape, hand hygiene, and Betadine prep. A preliminary ultrasound of the left wrist was performed and demonstrates a patent left  radial artery which measured up to 0.44 cm in diameter. Barbeau A. A permanent ultrasound image was recorded. Using a combination of fluoroscopy and ultrasound, an access site was determined. The overlying skin was anesthetized with 1% Lidocaine. Using ultrasound guidance, access into the left radial artery was obtained with visualization of needle entry into the vessel using standard micropuncture technique. Five French slender sheath was placed. Radial cocktail was administered as per protocol. An MG1 catheter was advanced into the celiac artery. Celiac angiography demonstrates conventional anatomy. The portal vein is  patent with hepatopedal flow. A Progreat Omega microcatheter and Fathom 16 microwire were used to select the proper hepatic artery and angiography was performed in multiple obliquities, demonstrating a hypervascular mass in the right lobe, consistent with hepatocellular carcinoma. The microcatheter and wire were then used to select the right superior hepatic artery and angiogram was repeated. Angiography demonstrates supply to the targeted mass. There are at least 3 branches arising from the proximal right superior hepatic artery the directly supply the mass. Opacification of the satellite, smaller hepatoma is also seen. There is no evidence of non-target extra-hepatic branches. Cone beam CT was performed, demonstrating peripherally enhancing mass in segment 8 measuring up to approximately 5.1 cm in maximum axial diameter. Given proximity to the central aspect of the liver, the left hepatic artery was then selected for angiogram. Left hepatic angiogram demonstrated patent vessels with indeterminate supply to the right-sided mass. Sub selection of the segment 4 hepatic artery was performed. Cone beam CT demonstrates no definite opacification of the segment 8 mass from the segment 4 arteries. The catheter was then repositioned into the right superior hepatic artery. Per protocol, MAA was then infused  into the right superior hepatic artery. All wires and catheters were removed. Hemostasis was achieved using a TR band. There were no immediate complications. Peripheral pulses were unchanged1. The patient was transferred to the recovery area in stable condition. IMPRESSION: 1. Visceral angiography demonstrating two hypervascular masses in the right hepatic lobe, consistent with hepatocellular carcinoma. 2. MAA infusion into the right superior hepatic artery. 3. We will follow up shunt fraction calculations and calculate liver volumes in preparation for radioembolization therapy. Ruthann Cancer, MD [name] Electronically Signed   By: Ruthann Cancer M.D.   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 EXAM: 1. Ultrasound-guided access of the left radial artery 2. Catheterization and angiography of the celiac, proper hepatic, and right and left hepatic arteries 3. Cone beam CT 4. MAA injection into the right superior hepatic artery MEDICATIONS: None. ANESTHESIA/SEDATION: Moderate (conscious) sedation was employed during this procedure. A total of Versed 4 mg and Fentanyl 200 mcg was administered intravenously. Moderate Sedation Time: 75 minutes. The patient's level of consciousness and vital signs were monitored continuously by radiology nursing throughout the procedure under my direct supervision. CONTRAST:  52m OMNIPAQUE IOHEXOL 300 MG/ML SOLN, 671mOMNIPAQUE IOHEXOL 300 MG/ML SOLN, 6468mMNIPAQUE IOHEXOL 300 MG/ML SOLN FLUOROSCOPY TIME:  1.96.270y COMPLICATIONS: None immediate. PROCEDURE: Informed consent was obtained from the patient following explanation of the procedure, risks, benefits and alternatives. The patient understands, agrees and consents for the procedure. All questions  were addressed. A time out was performed prior to the initiation of the procedure. Maximal barrier sterile technique utilized including caps, mask, sterile gowns, sterile gloves, large sterile drape, hand hygiene, and Betadine prep. A preliminary ultrasound of the left wrist was performed and demonstrates a patent left radial artery which measured up to 0.44 cm in diameter. Barbeau A. A permanent ultrasound image was recorded. Using a combination of fluoroscopy and ultrasound, an access site was determined. The overlying skin was anesthetized with 1% Lidocaine. Using ultrasound guidance, access into the left radial artery was obtained with visualization of needle entry into the vessel using standard micropuncture technique. Five French slender sheath was placed. Radial cocktail was administered as per  protocol. An MG1 catheter was advanced into the celiac artery. Celiac angiography demonstrates conventional anatomy. The portal vein is patent with hepatopedal flow. A Progreat Omega microcatheter and Fathom 16 microwire were used to select the proper hepatic artery and angiography was performed in multiple obliquities, demonstrating a hypervascular mass in the right lobe, consistent with hepatocellular carcinoma. The microcatheter and wire were then used to select the right superior hepatic artery and angiogram was repeated. Angiography demonstrates supply to the targeted mass. There are at least 3 branches arising from the proximal right superior hepatic artery the directly supply the mass. Opacification of the satellite, smaller hepatoma is also seen. There is no evidence of non-target extra-hepatic branches. Cone beam CT was performed, demonstrating peripherally enhancing mass in segment 8 measuring up to approximately 5.1 cm in maximum axial diameter. Given proximity to the central aspect of the liver, the left hepatic artery was then selected for angiogram. Left hepatic angiogram demonstrated patent vessels with  indeterminate supply to the right-sided mass. Sub selection of the segment 4 hepatic artery was performed. Cone beam CT demonstrates no definite opacification of the segment 8 mass from the segment 4 arteries. The catheter was then repositioned into the right superior hepatic artery. Per protocol, MAA was then infused into the right superior hepatic artery. All wires and catheters were removed. Hemostasis was achieved using a TR band. There were no immediate complications. Peripheral pulses were unchanged1. The patient was transferred to the recovery area in stable condition. IMPRESSION: 1. Visceral angiography demonstrating two hypervascular masses in the right hepatic lobe, consistent with hepatocellular carcinoma. 2. MAA infusion into the right superior hepatic artery. 3. We will follow up shunt fraction calculations and calculate liver volumes in preparation for radioembolization therapy. Ruthann Cancer, MD [facility] Electronically Signed   By: Ruthann Cancer M.D.   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 EXAM: 1. Ultrasound-guided access of the left radial artery 2. Catheterization and angiography of the celiac, proper hepatic, and right and left hepatic arteries 3. Cone beam CT 4. MAA injection into the right superior hepatic artery MEDICATIONS: None. ANESTHESIA/SEDATION: Moderate (conscious) sedation was employed during this procedure. A total of Versed 4 mg and Fentanyl 200 mcg was administered intravenously. Moderate Sedation Time: 75 minutes. The patient's level of consciousness and vital signs were monitored continuously by radiology nursing throughout the procedure under my direct supervision. CONTRAST:  65m OMNIPAQUE IOHEXOL 300 MG/ML SOLN, 625mOMNIPAQUE IOHEXOL 300 MG/ML  SOLN, 6422mMNIPAQUE IOHEXOL 300 MG/ML SOLN FLUOROSCOPY TIME:  1.95.465y COMPLICATIONS: None immediate. PROCEDURE: Informed consent was obtained from the patient following explanation of the procedure, risks, benefits and alternatives. The patient understands, agrees and consents for the procedure. All questions were addressed. A time out was performed prior to the initiation of the procedure. Maximal barrier sterile technique utilized including caps, mask, sterile gowns, sterile gloves, large sterile drape, hand hygiene, and Betadine prep. A preliminary ultrasound of the left wrist was performed and demonstrates a patent left radial artery which measured up to 0.44 cm in diameter. Barbeau A. A permanent ultrasound image was recorded. Using a combination of fluoroscopy and ultrasound, an access site was determined. The overlying skin was anesthetized with 1% Lidocaine. Using ultrasound guidance, access into the left radial artery was obtained with visualization of needle entry  into the vessel using standard micropuncture technique. Five French slender sheath was placed. Radial cocktail was administered as per protocol. An MG1 catheter was advanced into the celiac artery. Celiac angiography demonstrates conventional anatomy. The portal vein is patent with hepatopedal flow. A Progreat Omega microcatheter and Fathom 16 microwire were used to select the proper hepatic artery and angiography was performed in multiple obliquities, demonstrating a hypervascular mass in the right lobe, consistent with hepatocellular carcinoma. The microcatheter and wire were then used to select the right superior hepatic artery and angiogram was repeated. Angiography demonstrates supply to the targeted mass. There are at least 3 branches arising from the proximal right superior hepatic artery the directly supply the mass. Opacification of the satellite, smaller hepatoma is also seen. There is no evidence of non-target extra-hepatic  branches. Cone beam CT was performed, demonstrating peripherally enhancing mass in segment 8 measuring up to approximately 5.1 cm in maximum axial diameter. Given proximity to the central aspect of the liver, the left hepatic artery was then selected for angiogram. Left hepatic angiogram demonstrated patent vessels with indeterminate supply to the right-sided mass. Sub selection of the segment 4 hepatic artery was performed. Cone beam CT demonstrates no definite opacification of the segment 8 mass from the segment 4 arteries. The catheter was then repositioned into the right superior hepatic artery. Per protocol, MAA was then infused into the right superior hepatic artery. All wires and catheters were removed. Hemostasis was achieved using a TR band. There were no immediate complications. Peripheral pulses were unchanged1. The patient was transferred to the recovery area in stable condition. IMPRESSION: 1. Visceral angiography demonstrating two hypervascular masses in the right hepatic lobe, consistent with hepatocellular carcinoma. 2. MAA infusion into the right superior hepatic artery. 3. We will follow up shunt fraction calculations and calculate liver volumes in preparation for radioembolization therapy. Ruthann Cancer, MD [facility] Electronically Signed   By: Ruthann Cancer M.D.   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 EXAM: 1. Ultrasound-guided access of the left radial artery 2. Catheterization and angiography of the celiac, proper hepatic, and right and left hepatic arteries 3. Cone beam CT 4. MAA injection into the right superior hepatic artery MEDICATIONS: None. ANESTHESIA/SEDATION: Moderate (conscious) sedation was employed during this procedure. A total  of Versed 4 mg and Fentanyl 200 mcg was administered intravenously. Moderate Sedation Time: 75 minutes. The patient's level of consciousness and vital signs were monitored continuously by radiology nursing throughout the procedure under my direct supervision. CONTRAST:  43m OMNIPAQUE IOHEXOL 300 MG/ML SOLN, 685mOMNIPAQUE IOHEXOL 300 MG/ML SOLN, 6480mMNIPAQUE IOHEXOL 300 MG/ML SOLN FLUOROSCOPY TIME:  1.97.124y COMPLICATIONS: None immediate. PROCEDURE: Informed consent was obtained from the patient following explanation of the procedure, risks, benefits and alternatives. The patient understands, agrees and consents for the procedure. All questions were addressed. A time out was performed prior to the initiation of the procedure. Maximal barrier sterile technique utilized including caps, mask, sterile gowns, sterile gloves, large sterile drape, hand hygiene, and Betadine prep. A preliminary ultrasound of the left wrist was performed and demonstrates a patent left radial artery which measured up to 0.44 cm in diameter. Barbeau A. A permanent ultrasound image was recorded. Using a combination of fluoroscopy and ultrasound, an access site was determined. The overlying skin  was anesthetized with 1% Lidocaine. Using ultrasound guidance, access into the left radial artery was obtained with visualization of needle entry into the vessel using standard micropuncture technique. Five French slender sheath was placed. Radial cocktail was administered as per protocol. An MG1 catheter was advanced into the celiac artery. Celiac angiography demonstrates conventional anatomy. The portal vein is patent with hepatopedal flow. A Progreat Omega microcatheter and Fathom 16 microwire were used to select the proper hepatic artery and angiography was performed in multiple obliquities, demonstrating a hypervascular mass in the right lobe, consistent with hepatocellular carcinoma. The microcatheter and wire were then used to select the right  superior hepatic artery and angiogram was repeated. Angiography demonstrates supply to the targeted mass. There are at least 3 branches arising from the proximal right superior hepatic artery the directly supply the mass. Opacification of the satellite, smaller hepatoma is also seen. There is no evidence of non-target extra-hepatic branches. Cone beam CT was performed, demonstrating peripherally enhancing mass in segment 8 measuring up to approximately 5.1 cm in maximum axial diameter. Given proximity to the central aspect of the liver, the left hepatic artery was then selected for angiogram. Left hepatic angiogram demonstrated patent vessels with indeterminate supply to the right-sided mass. Sub selection of the segment 4 hepatic artery was performed. Cone beam CT demonstrates no definite opacification of the segment 8 mass from the segment 4 arteries. The catheter was then repositioned into the right superior hepatic artery. Per protocol, MAA was then infused into the right superior hepatic artery. All wires and catheters were removed. Hemostasis was achieved using a TR band. There were no immediate complications. Peripheral pulses were unchanged1. The patient was transferred to the recovery area in stable condition. IMPRESSION: 1. Visceral angiography demonstrating two hypervascular masses in the right hepatic lobe, consistent with hepatocellular carcinoma. 2. MAA infusion into the right superior hepatic artery. 3. We will follow up shunt fraction calculations and calculate liver volumes in preparation for radioembolization therapy. Ruthann Cancer, MD [facility] Electronically Signed   By: Ruthann Cancer M.D.   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 EXAM: 1.  Ultrasound-guided access of the left radial artery 2. Catheterization and angiography of the celiac, proper hepatic, and right and left hepatic arteries 3. Cone beam CT 4. MAA injection into the right superior hepatic artery MEDICATIONS: None. ANESTHESIA/SEDATION: Moderate (conscious) sedation was employed during this procedure. A total of Versed 4 mg and Fentanyl 200 mcg was administered intravenously. Moderate Sedation Time: 75 minutes. The patient's level of consciousness and vital signs were monitored continuously by radiology nursing throughout the procedure under my direct supervision. CONTRAST:  88m OMNIPAQUE IOHEXOL 300 MG/ML SOLN, 6104mOMNIPAQUE IOHEXOL 300 MG/ML SOLN, 6483mMNIPAQUE IOHEXOL 300 MG/ML SOLN FLUOROSCOPY TIME:  1.91.829y COMPLICATIONS: None immediate. PROCEDURE: Informed consent was obtained from the patient following explanation of the procedure, risks, benefits and alternatives. The patient understands, agrees and consents for the procedure. All questions were addressed. A time out was performed prior to the initiation of the procedure. Maximal barrier sterile technique utilized including caps, mask, sterile gowns, sterile gloves, large sterile drape, hand hygiene, and Betadine prep. A preliminary ultrasound of the left wrist was performed and demonstrates a patent left radial artery which measured up to 0.44 cm in diameter. Barbeau A.  A permanent ultrasound image was recorded. Using a combination of fluoroscopy and ultrasound, an access site was determined. The overlying skin was anesthetized with 1% Lidocaine. Using ultrasound guidance, access into the left radial artery was obtained with visualization of needle entry into the vessel using standard micropuncture technique. Five French slender sheath was placed. Radial cocktail was administered as per protocol. An MG1 catheter was advanced into the celiac artery. Celiac angiography demonstrates conventional anatomy. The portal vein is  patent with hepatopedal flow. A Progreat Omega microcatheter and Fathom 16 microwire were used to select the proper hepatic artery and angiography was performed in multiple obliquities, demonstrating a hypervascular mass in the right lobe, consistent with hepatocellular carcinoma. The microcatheter and wire were then used to select the right superior hepatic artery and angiogram was repeated. Angiography demonstrates supply to the targeted mass. There are at least 3 branches arising from the proximal right superior hepatic artery the directly supply the mass. Opacification of the satellite, smaller hepatoma is also seen. There is no evidence of non-target extra-hepatic branches. Cone beam CT was performed, demonstrating peripherally enhancing mass in segment 8 measuring up to approximately 5.1 cm in maximum axial diameter. Given proximity to the central aspect of the liver, the left hepatic artery was then selected for angiogram. Left hepatic angiogram demonstrated patent vessels with indeterminate supply to the right-sided mass. Sub selection of the segment 4 hepatic artery was performed. Cone beam CT demonstrates no definite opacification of the segment 8 mass from the segment 4 arteries. The catheter was then repositioned into the right superior hepatic artery. Per protocol, MAA was then infused into the right superior hepatic artery. All wires and catheters were removed. Hemostasis was achieved using a TR band. There were no immediate complications. Peripheral pulses were unchanged1. The patient was transferred to the recovery area in stable condition. IMPRESSION: 1. Visceral angiography demonstrating two hypervascular masses in the right hepatic lobe, consistent with hepatocellular carcinoma. 2. MAA infusion into the right superior hepatic artery. 3. We will follow up shunt fraction calculations and calculate liver volumes in preparation for radioembolization therapy. Ruthann Cancer, MD [facility] Electronically Signed   By: Ruthann Cancer M.D.   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 EXAM: 1. Ultrasound-guided access of the left radial artery 2. Catheterization and angiography of the celiac, proper hepatic, and right and left hepatic arteries 3. Cone beam CT 4. MAA injection into the right superior hepatic artery MEDICATIONS: None. ANESTHESIA/SEDATION: Moderate (conscious) sedation was employed during this procedure. A total of Versed 4 mg and Fentanyl 200 mcg was administered intravenously. Moderate Sedation Time: 75 minutes. The patient's level of consciousness and vital signs were monitored continuously by radiology nursing throughout the procedure under my direct supervision. CONTRAST:  44m OMNIPAQUE IOHEXOL 300 MG/ML SOLN, 676mOMNIPAQUE IOHEXOL 300 MG/ML SOLN, 6412mMNIPAQUE IOHEXOL 300 MG/ML SOLN FLUOROSCOPY TIME:  1.93.235y COMPLICATIONS: None immediate. PROCEDURE: Informed consent was obtained from the patient following explanation of the procedure, risks, benefits and alternatives. The patient understands, agrees and consents for the procedure. All questions were addressed. A time out was performed prior to the initiation of the procedure. Maximal barrier sterile technique utilized including caps, mask, sterile gowns, sterile gloves, large sterile drape, hand hygiene, and Betadine prep. A preliminary ultrasound of the  left wrist was performed and demonstrates a patent left radial artery which measured up to 0.44 cm in diameter. Barbeau A. A permanent ultrasound image was recorded. Using a combination of fluoroscopy and ultrasound, an access site was determined. The overlying skin was anesthetized with 1% Lidocaine. Using ultrasound guidance, access  into the left radial artery was obtained with visualization of needle entry into the vessel using standard micropuncture technique. Five French slender sheath was placed. Radial cocktail was administered as per protocol. An MG1 catheter was advanced into the celiac artery. Celiac angiography demonstrates conventional anatomy. The portal vein is patent with hepatopedal flow. A Progreat Omega microcatheter and Fathom 16 microwire were used to select the proper hepatic artery and angiography was performed in multiple obliquities, demonstrating a hypervascular mass in the right lobe, consistent with hepatocellular carcinoma. The microcatheter and wire were then used to select the right superior hepatic artery and angiogram was repeated. Angiography demonstrates supply to the targeted mass. There are at least 3 branches arising from the proximal right superior hepatic artery the directly supply the mass. Opacification of the satellite, smaller hepatoma is also seen. There is no evidence of non-target extra-hepatic branches. Cone beam CT was performed, demonstrating peripherally enhancing mass in segment 8 measuring up to approximately 5.1 cm in maximum axial diameter. Given proximity to the central aspect of the liver, the left hepatic artery was then selected for angiogram. Left hepatic angiogram demonstrated patent vessels with indeterminate supply to the right-sided mass. Sub selection of the segment 4 hepatic artery was performed. Cone beam CT demonstrates no definite opacification of the segment 8 mass from the segment 4 arteries. The catheter was then repositioned into the right superior hepatic artery. Per protocol, MAA was then infused into the right superior hepatic artery. All wires and catheters were removed. Hemostasis was achieved using a TR band. There were no immediate complications. Peripheral pulses were unchanged1. The patient was transferred to the recovery area in stable condition. IMPRESSION: 1.  Visceral angiography demonstrating two hypervascular masses in the right hepatic lobe, consistent with hepatocellular carcinoma. 2. MAA infusion into the right superior hepatic artery. 3. We will follow up shunt fraction calculations and calculate liver volumes in preparation for radioembolization therapy. Ruthann Cancer, MD [facility] Radiology Electronically Signed   By: Ruthann Cancer M.D.   On: 11/07/2021 13:52    Labs:  CBC: Recent Labs    11/15/21 0451 11/23/21 0727 11/26/21 0408 11/27/21 0651 11/27/21 0657  WBC 3.6* 4.2 3.9* 3.8*  --   HGB 7.3* 7.2* 7.6* 7.1* 7.5*  HCT 22.0* 22.3* 23.0* 21.6* 22.0*  PLT 97* 116* 116* 107*  --     COAGS: Recent Labs    10/05/21 0729 10/24/21 0812 11/07/21 0830 11/13/21 0351  INR 1.1 1.0 1.1 1.1  APTT  --   --   --  37*    BMP: Recent Labs    11/15/21 0451 11/23/21 0727 11/26/21 0408 11/27/21 0651 11/27/21 0657  NA 131* 137 131* 126* 127*  K 3.8 4.7 4.1 4.2 4.4  CL 94* 101 93* 93* 94*  CO2 25 19* 21* 19*  --   GLUCOSE 88 91 81 79 79  BUN 40* 59* 37* 45* 47*  CALCIUM 8.4* 8.7* 8.5* 8.0*  --   CREATININE 8.48* 11.59* 9.22* 10.48* 11.90*  GFRNONAA 7* 4* 6* 5*  --     LIVER FUNCTION TESTS: Recent Labs    11/12/21 0850 11/13/21 0351 11/14/21 0407 11/15/21  0451 11/27/21 0651  BILITOT 1.2 0.7 1.1  --  1.1  AST 47* 44* 47*  --  45*  ALT 17 16 18   --  15  ALKPHOS 141* 122 129*  --  117  PROT 8.2* 8.1 8.3*  --  8.2*  ALBUMIN 2.5* 2.4* 2.5* 2.3* 2.5*    TUMOR MARKERS: No results for input(s): "AFPTM", "CEA", "CA199", "CHROMGRNA" in the last 8760 hours.  Assessment and Plan:  Antion Andres is a 61 yo male with PMH significant for anemia, diabetes, nephrolithiasis, cholelithiasis, ESRD, hypertension, intracerebral hemorrhage in 2019, peripheral artery disease with left BKA, hepatitis C, and cirrhosis being seen today for treatment of multifocal hepatocellular carcinoma with  Y90 treatment. Patient has undergone appropriate pre-procedural arterial road-mapping with Dr Serafina Royals. Case has been reviewed by Dr Serafina Royals and patient is set to proceed with Y90 treatment on 11/28/21.  Risks and benefits discussed with the patient including, but not limited to bleeding, infection, vascular injury, post procedural pain, nausea, vomiting and fatigue, contrast induced renal failure, liver failure, radiation injury to the bowel, radiation induced cholecystitis, neutropenia and possible need for additional procedures.  All of the patient's questions were answered, patient is agreeable to proceed. Consent signed and in chart.   Thank you for this interesting consult.  I greatly enjoyed meeting RAYDAN SCHLABACH and look forward to participating in their care.  A copy of this report was sent to the requesting provider on this date.  Electronically Signed: Lura Em, PA-C 11/28/2021, 8:23 AM   I spent a total of    15 Minutes in face to face in clinical consultation, greater than 50% of which was counseling/coordinating care for multifocal hepatocellular carcinoma

## 2021-12-01 ENCOUNTER — Encounter (HOSPITAL_COMMUNITY): Payer: Self-pay | Admitting: Radiology

## 2021-12-03 ENCOUNTER — Emergency Department (HOSPITAL_COMMUNITY)
Admission: EM | Admit: 2021-12-03 | Discharge: 2021-12-03 | Discharge status: Against Medical Advice | Payer: Medicare Other | Attending: Emergency Medicine | Admitting: Emergency Medicine

## 2021-12-03 DIAGNOSIS — R079 Chest pain, unspecified: Secondary | ICD-10-CM | POA: Insufficient documentation

## 2021-12-03 DIAGNOSIS — R140 Abdominal distension (gaseous): Secondary | ICD-10-CM | POA: Insufficient documentation

## 2021-12-03 DIAGNOSIS — Z992 Dependence on renal dialysis: Secondary | ICD-10-CM | POA: Insufficient documentation

## 2021-12-03 DIAGNOSIS — R0602 Shortness of breath: Secondary | ICD-10-CM | POA: Insufficient documentation

## 2021-12-03 DIAGNOSIS — Z5321 Procedure and treatment not carried out due to patient leaving prior to being seen by health care provider: Secondary | ICD-10-CM | POA: Diagnosis not present

## 2021-12-03 LAB — COMPREHENSIVE METABOLIC PANEL
ALT: 14 U/L (ref 0–44)
AST: 38 U/L (ref 15–41)
Albumin: 2.5 g/dL — ABNORMAL LOW (ref 3.5–5.0)
Alkaline Phosphatase: 102 U/L (ref 38–126)
Anion gap: 20 — ABNORMAL HIGH (ref 5–15)
BUN: 59 mg/dL — ABNORMAL HIGH (ref 8–23)
CO2: 15 mmol/L — ABNORMAL LOW (ref 22–32)
Calcium: 7.6 mg/dL — ABNORMAL LOW (ref 8.9–10.3)
Chloride: 86 mmol/L — ABNORMAL LOW (ref 98–111)
Creatinine, Ser: 11.55 mg/dL — ABNORMAL HIGH (ref 0.61–1.24)
GFR, Estimated: 5 mL/min — ABNORMAL LOW (ref >60)
Glucose, Bld: 79 mg/dL (ref 70–99)
Potassium: 4.5 mmol/L (ref 3.5–5.1)
Sodium: 121 mmol/L — ABNORMAL LOW (ref 135–145)
Total Bilirubin: 1 mg/dL (ref 0.3–1.2)
Total Protein: 8 g/dL (ref 6.5–8.1)

## 2021-12-03 LAB — CBC WITH DIFFERENTIAL/PLATELET
Abs Immature Granulocytes: 0.01 10*3/uL (ref 0.00–0.07)
Basophils Absolute: 0 10*3/uL (ref 0.0–0.1)
Basophils Relative: 0 %
Eosinophils Absolute: 0.1 10*3/uL (ref 0.0–0.5)
Eosinophils Relative: 1 %
HCT: 19.1 % — ABNORMAL LOW (ref 39.0–52.0)
Hemoglobin: 6.6 g/dL — CL (ref 13.0–17.0)
Immature Granulocytes: 0 %
Lymphocytes Relative: 10 %
Lymphs Abs: 0.4 10*3/uL — ABNORMAL LOW (ref 0.7–4.0)
MCH: 33.5 pg (ref 26.0–34.0)
MCHC: 34.6 g/dL (ref 30.0–36.0)
MCV: 97 fL (ref 80.0–100.0)
Monocytes Absolute: 0.6 10*3/uL (ref 0.1–1.0)
Monocytes Relative: 13 %
Neutro Abs: 3.4 10*3/uL (ref 1.7–7.7)
Neutrophils Relative %: 76 %
Platelets: 110 10*3/uL — ABNORMAL LOW (ref 150–400)
RBC: 1.97 MIL/uL — ABNORMAL LOW (ref 4.22–5.81)
RDW: 16.2 % — ABNORMAL HIGH (ref 11.5–15.5)
WBC: 4.5 10*3/uL (ref 4.0–10.5)
nRBC: 0 % (ref 0.0–0.2)

## 2021-12-03 NOTE — Progress Notes (Deleted)
Cardiology Office Note:    Date:  12/03/2021   ID:  [name], DOB Jun 26, 1960, MRN [id number]  PCP:  [name], NP [place] Cardiologist: Quay Burow, MD   Reason for visit: 33-monthfollow-up  History of Present Illness:    RMIZRAIM HARMENINGis a 61y.o. male with a hx of ESRD on HD, tobacco use, hypertension, diabetes, PAD status post left BKA.  Peripheral angiogram performed by Dr. CDonzetta Matters12/05/2020 showing no endovascular surgical options for limb salvage.  He was admitted to the hospital on 06/13/2021 with chest pain.  His troponins were flat.  2D echo was normal.  He continued to have atypical chest pain as follow-up with Dr. BGwenlyn Foundin March 2023.  CTA of the coronaries ordered -- not done.  I last saw him on September 26, 2021.  He had no further chest tightness.  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  He was told this is possibly secondary to rotator cuff issues and told to follow-up with orthopedics.     Today, he states he has not had any further chest tightness since his ED visit in February.  He is wheelchair-bound following BKA.  States he has prosthesis fitting scheduled in July.  He has intermittent shortness of breath.  He denies history of pulmonary evaluation.  He is a long-term smoker currently half pack per day for last 45 years.  He denies PND, orthopnea, palpitations, lightheadedness and syncope.  He has mild edema.      Precordial pain, improved - Admitted 06/13/2021 with  atypical chest pain.  His troponins were flat.  His 2D echo showed normal LV function although he was volume overloaded.  His EKG showed no acute changes.  It was suggested that he have a coronary CTA to further evaluate though never done. -With history of tobacco use and PAD, will follow through with coronary CTA scheduling.  With HR 91, take metoprolol to tartrate '100mg'$  + ivabradine 10 mg prior to scan.   PAD -History of severe PAD status post recent angiogram by Dr. CDonzetta Matters12/05/2020 because of critical ischemia.  There were no endovascular surgical options and he ended up having a left BKA.    Hypertension, reasonably controlled -Continue amlodipine.   -Followed by nephrology -Goal BP is <130/80.     Hyperlipidemia -LDL 32 in September 2021.  Continue Lipitor 40 mg daily.   Tobacco use  -Recommend tobacco cessation.  Reviewed physiologic effects of nicotine and the immediate-eventual benefits of quitting including improvement in cough/breathing and reduction in cardiovascular events.  Discussed quitting tips such as removing triggers and getting support from family/friends and Quitline Walker. -Patient defers on pharmacist referral for tobacco cessation aid.  Declines referral to pulmonology for COPD evaluation.   Disposition - Follow-up in 2 months to follow-up CTA results.    Past Medical History:  Diagnosis Date   Anemia    Diabetes mellitus without complication ([facility]    patient denies   Dialysis patient ([facility]    End stage chronic kidney disease ([place]    Hypertension  ICH (intracerebral hemorrhage) ([place]) 05/20/2017   PAD (peripheral artery disease) ([facility])    Shoulder pain, left 06/28/2013    Past Surgical History:  Procedure Laterality Date   A/V FISTULAGRAM N/A 08/15/2020   Procedure: A/V FISTULAGRAM - Left Upper;  Surgeon: Cherre Robins, MD;  Location: [place] [facility];  Service: Cardiovascular;  Laterality: N/A;   ABDOMINAL AORTOGRAM W/LOWER EXTREMITY N/A 04/08/2021    Procedure: ABDOMINAL AORTOGRAM W/LOWER EXTREMITY;  Surgeon: Waynetta Sandy, MD;  Location: [place] [facility];  Service: Cardiovascular;  Laterality: N/A;   AMPUTATION Left 04/16/2021   Procedure: LEFT BELOW KNEE AMPUTATION;  Surgeon: Angelia Mould, MD;  Location: [place];  Service: Vascular;  Laterality: Left;   AMPUTATION Left 06/17/2021   Procedure: REVISION AMPUTATION BELOW KNEE;  Surgeon: Broadus John, MD;  Location: [place];  Service: Vascular;  Laterality: Left;   APPENDECTOMY     APPLICATION OF WOUND VAC Left 06/17/2021   Procedure: APPLICATION OF WOUND VAC;  Surgeon: Broadus John, MD;  Location: [place];  Service: Vascular;  Laterality: Left;   AV FISTULA PLACEMENT Left 08/03/2019   Procedure: LEFT ARM ARTERIOVENOUS (AV) CEPHALIC  FISTULA CREATION;  Surgeon: Waynetta Sandy, MD;  Location: [place];  Service: Vascular;  Laterality: Left;   BIOPSY  06/30/2019   Procedure: BIOPSY;  Surgeon: Ronald Lobo, MD;  Location: [facility];  Service: Endoscopy;;   BIOPSY  08/02/2019   Procedure: BIOPSY;  Surgeon: Yetta Flock, MD;  Location: [facility] ENDOSCOPY;  Service: Gastroenterology;;   BIOPSY  02/08/2021   Procedure: BIOPSY;  Surgeon: Sharyn Creamer, MD;  Location: [facility] ENDOSCOPY;  Service: Gastroenterology;;   BIOPSY  02/24/2021   Procedure: BIOPSY;  Surgeon: [name], MD;  Location: [facility] ENDOSCOPY;  Service: Gastroenterology;;   BIOPSY  03/26/2021   Procedure: BIOPSY;  Surgeon: Lavena Bullion, DO;  Location: [place];  Service: Gastroenterology;;   BIOPSY  08/06/2021   Procedure: BIOPSY;  Surgeon: [name], MD;  Location: [place];  Service: Gastroenterology;;   BIOPSY  09/15/2021   Procedure: BIOPSY;  Surgeon: Sharyn Creamer, MD;  Location: [place];  Service: Gastroenterology;;   COLONOSCOPY  01/23/2012   Procedure: COLONOSCOPY;  Surgeon: Danie Binder, MD;  [facility];  Service: Endoscopy;  Laterality: N/A;   11:10 AM   COLONOSCOPY WITH PROPOFOL N/A 06/30/2019   Procedure: COLONOSCOPY WITH PROPOFOL;  Surgeon: Ronald Lobo, MD;  Location: [place];  Service: Endoscopy;  Laterality: N/A;   ENTEROSCOPY N/A 08/02/2019   Procedure: ENTEROSCOPY;  Surgeon: Yetta Flock, MD;  Location: [facility] ENDOSCOPY;  Service: Gastroenterology;  Laterality: N/A;   ENTEROSCOPY N/A 02/24/2021   Procedure: ENTEROSCOPY;  Surgeon: [name], MD;  Location: [facility] ENDOSCOPY;  Service: Gastroenterology;  Laterality: N/A;   ENTEROSCOPY N/A 03/26/2021   Procedure: ENTEROSCOPY;  Surgeon: Lavena Bullion, DO;  Location: [place];  Service: Gastroenterology;  Laterality: N/A;   ENTEROSCOPY N/A 07/05/2021   Procedure: ENTEROSCOPY;  Surgeon: Carol Ada, MD;  Location: [place];  Service: Gastroenterology;  Laterality: N/A;   ENTEROSCOPY N/A 08/06/2021   Procedure: ENTEROSCOPY;  Surgeon: [name], MD;  Location: [facility] ENDOSCOPY;  Service: Gastroenterology;  Laterality: N/A;   ENTEROSCOPY N/A 08/24/2021   Procedure: ENTEROSCOPY;  Surgeon: Ladene Artist, MD;  Location: [facility] ENDOSCOPY;  Service: Gastroenterology;  Laterality: N/A;   ENTEROSCOPY N/A 09/15/2021   Procedure: ENTEROSCOPY;  Surgeon: Sharyn Creamer, MD;  Location: [facility] ENDOSCOPY;  Service: Gastroenterology;  Laterality: N/A;   ENTEROSCOPY N/A 10/06/2021   Procedure: ENTEROSCOPY;  Surgeon: [name], MD;  Location: [facility] ENDOSCOPY;  Service: Gastroenterology;  Laterality: N/A;   ENTEROSCOPY N/A 11/14/2021   Procedure: ENTEROSCOPY;  Surgeon: Doran Stabler, MD;  Location: [facility] ENDOSCOPY;  Service: Gastroenterology;  Laterality: N/A;   ESOPHAGOGASTRODUODENOSCOPY N/A 08/10/2020   Procedure: ESOPHAGOGASTRODUODENOSCOPY (EGD);  Surgeon: Jerene Bears, MD;  Location: [facility] ENDOSCOPY;  Service: Gastroenterology;  Laterality: N/A;   ESOPHAGOGASTRODUODENOSCOPY (EGD) WITH PROPOFOL N/A 06/30/2019   Procedure: ESOPHAGOGASTRODUODENOSCOPY (EGD) WITH PROPOFOL;   Surgeon: Ronald Lobo, MD;  Location: [place];  Service: Endoscopy;  Laterality: N/A;   ESOPHAGOGASTRODUODENOSCOPY (EGD) WITH PROPOFOL N/A 01/12/2021   Procedure: ESOPHAGOGASTRODUODENOSCOPY (EGD) WITH PROPOFOL;  Surgeon: Sharyn Creamer, MD;  Location: [place];  Service: Gastroenterology;  Laterality: N/A;   ESOPHAGOGASTRODUODENOSCOPY (EGD) WITH PROPOFOL N/A 02/08/2021   Procedure: ESOPHAGOGASTRODUODENOSCOPY (EGD) WITH PROPOFOL;  Surgeon: Sharyn Creamer, MD;  Location: [place];  Service: Gastroenterology;  Laterality: N/A;   FISTULA SUPERFICIALIZATION Left 10/17/2019   Procedure: LEFT UPPER EXTREMITY FISTULA REVISION, SIDE BRANCH LIGATION,  AND SUPERFICIALIZATION;  Surgeon: Marty Heck, MD;  Location: [place];  Service: Vascular;  Laterality: Left;   FISTULA SUPERFICIALIZATION Left 63/78/5885   Procedure: PLICATION OF ANEURYSM LEFT ARTERIOVENOUS FISTULA;  Surgeon: Angelia Mould, MD;  Location: [place];  Service: Vascular;  Laterality: Left;   FLEXIBLE SIGMOIDOSCOPY N/A 07/05/2021   Procedure: FLEXIBLE SIGMOIDOSCOPY;  Surgeon: Carol Ada, MD;  Location: [place];  Service: Gastroenterology;  Laterality: N/A;   GIVENS CAPSULE STUDY N/A 06/30/2019   Procedure: GIVENS CAPSULE STUDY;  Surgeon: Ronald Lobo, MD;  Location: [place];  Service: Endoscopy;  Laterality: N/A;   GIVENS CAPSULE STUDY N/A 08/25/2021   Procedure: GIVENS CAPSULE STUDY;  Surgeon: Ladene Artist, MD;  Location: [place];  Service: Gastroenterology;  Laterality: N/A;   GIVENS CAPSULE STUDY N/A 10/06/2021   Procedure: GIVENS CAPSULE STUDY;  Surgeon: [name], MD;  Location: [place];  Service: Gastroenterology;  Laterality: N/A;   HEMOSTASIS CLIP PLACEMENT  08/10/2020   Procedure: HEMOSTASIS CLIP PLACEMENT;  Surgeon: Jerene Bears, MD;  Location: [place];  Service: Gastroenterology;;   HEMOSTASIS CLIP PLACEMENT  01/12/2021   Procedure: HEMOSTASIS CLIP PLACEMENT;  Surgeon:  Sharyn Creamer, MD;  Location: [place];  Service: Gastroenterology;;   HEMOSTASIS CONTROL  08/02/2019   Procedure: HEMOSTASIS CONTROL;  Surgeon: Yetta Flock, MD;  Location: [place];  Service: Gastroenterology;;   HOT HEMOSTASIS  02/24/2021   Procedure: HOT HEMOSTASIS (ARGON PLASMA COAGULATION/BICAP);  Surgeon: [name], MD;  Location: [facility] ENDOSCOPY;  Service: Gastroenterology;;   HOT HEMOSTASIS N/A 03/26/2021   Procedure: HOT HEMOSTASIS (ARGON PLASMA COAGULATION/BICAP);  Surgeon: Lavena Bullion, DO;  Location: [facility] ENDOSCOPY;  Service: Gastroenterology;  Laterality: N/A;   HOT HEMOSTASIS N/A 07/05/2021   Procedure: HOT HEMOSTASIS (ARGON PLASMA COAGULATION/BICAP);  Surgeon: Carol Ada, MD;  Location: [place];  Service: Gastroenterology;  Laterality: N/A;   HOT HEMOSTASIS N/A 09/15/2021   Procedure: HOT HEMOSTASIS (ARGON PLASMA COAGULATION/BICAP);  Surgeon: Sharyn Creamer, MD;  Location: [place];  Service: Gastroenterology;  Laterality: N/A;   HOT HEMOSTASIS N/A 10/06/2021   Procedure: HOT HEMOSTASIS (ARGON PLASMA COAGULATION/BICAP);  Surgeon: [name], MD;  Location: [place];  Service: Gastroenterology;  Laterality: N/A;   INCISION AND DRAINAGE ABSCESS N/A 06/29/2016   Procedure: INCISION AND DRAINAGE ABDOMINAL WALL ABSCESS;  Surgeon: Alphonsa Overall, MD;  Location: [facility];  Service: General;  Laterality: N/A;   INSERTION OF DIALYSIS CATHETER Right 08/03/2019   Procedure: INSERTION OF DIALYSIS CATHETER;  Surgeon: Waynetta Sandy, MD;  Location: [place];  Service: Vascular;  Laterality: Right;   INSERTION OF DIALYSIS CATHETER Right 10/22/2019   Procedure: INSERTION OF 23CM TUNNELED DIALYSIS CATHETER RIGHT INTERNAL JUGULAR;  Surgeon: Angelia Mould, MD;  Location: [place];  Service: Vascular;  Laterality: Right;   INSERTION OF DIALYSIS CATHETER Right 08/12/2020   Procedure: INSERTION OF Right internal Jugular TUNNELED  DIALYSIS CATHETER.;   Surgeon: Waynetta Sandy, MD;  Location: [place];  Service: Vascular;  Laterality: Right;   INSERTION OF DIALYSIS CATHETER N/A 04/19/2021   Procedure: INSERTION OF TUNNELED DIALYSIS CATHETER;  Surgeon: Angelia Mould, MD;  [facility];  Service: Vascular;  Laterality: N/A;   IR 3D INDEPENDENT WKST  11/07/2021   IR ANGIOGRAM SELECTIVE EACH ADDITIONAL VESSEL  11/07/2021   IR ANGIOGRAM SELECTIVE EACH ADDITIONAL VESSEL  11/07/2021   IR ANGIOGRAM SELECTIVE EACH ADDITIONAL VESSEL  11/07/2021   IR ANGIOGRAM SELECTIVE EACH ADDITIONAL VESSEL  11/28/2021   IR ANGIOGRAM VISCERAL SELECTIVE  11/07/2021   IR ANGIOGRAM VISCERAL SELECTIVE  11/28/2021   IR EMBO ARTERIAL NOT HEMORR HEMANG INC GUIDE ROADMAPPING  11/07/2021   IR EMBO TUMOR ORGAN ISCHEMIA INFARCT INC GUIDE ROADMAPPING  11/28/2021   IR RADIOLOGIST EVAL & MGMT  09/12/2021   IR US GUIDE VASC ACCESS LEFT  11/07/2021   IR US GUIDE VASC ACCESS LEFT  11/28/2021   Left heel surgery     LOWER EXTREMITY ANGIOGRAPHY N/A 07/08/2021   Procedure: Lower Extremity Angiography;  Surgeon: Cherre Robins, MD;  Location: [place] [facility];  Service: Cardiovascular;  Laterality: N/A;   PENILE BIOPSY N/A 03/26/2020   Procedure: PENILE ULCER DEBRIDEMENT;  Surgeon: Remi Haggard, MD;  [facility];  Service: Urology;  Laterality: N/A;  30 MINS   PERIPHERAL VASCULAR INTERVENTION Right 07/08/2021   Procedure: PERIPHERAL VASCULAR INTERVENTION;  Surgeon: Cherre Robins, MD;  Location: [place] [facility];  Service: Cardiovascular;  Laterality: Right;   SCLEROTHERAPY  01/12/2021   Procedure: SCLEROTHERAPY;  Surgeon: Sharyn Creamer, MD;  Location: [facility] ENDOSCOPY;  Service: Gastroenterology;;   Clide Deutscher  02/24/2021   Procedure: Clide Deutscher;  Surgeon: [name], MD;  Location: [facility] ENDOSCOPY;  Service: Gastroenterology;;   SUBMUCOSAL TATTOO INJECTION  08/24/2021   Procedure: SUBMUCOSAL TATTOO INJECTION;  Surgeon: Ladene Artist, MD;  Location: [facility]  ENDOSCOPY;  Service: Gastroenterology;;   SUBMUCOSAL TATTOO INJECTION  09/15/2021   Procedure: SUBMUCOSAL TATTOO INJECTION;  Surgeon: Sharyn Creamer, MD;  Location: [facility] ENDOSCOPY;  Service: Gastroenterology;;    Current Medications: No outpatient medications have been marked as taking for the 12/04/21 encounter (Appointment) with Warren Lacy, PA-C.     Allergies:   Seroquel [quetiapine] and Dilaudid [hydromorphone hcl]   Social History   Socioeconomic History   Marital status: Single    Spouse name: Not on file   Number of children: 2   Years of education: 12   Highest education level: 12th grade  Occupational History   Occupation: disabled  Tobacco Use   Smoking status: Every Day    Packs/day: 0.50    Years: 45.00    Total pack years: 22.50    Types: Cigarettes   Smokeless tobacco: Never   Tobacco comments:    Pt left before information given  Vaping Use   Vaping Use: Never  used  Substance and Sexual Activity   Alcohol use: Not Currently   Drug use: Not Currently    Types: "Crack" cocaine    Comment: last in 2020   Sexual activity: Yes    Birth control/protection: None  Other Topics Concern   Not on file  Social History Narrative   Goes to dialysis M-W-F, LBKA using a wheelchair at present, waiting for prosthetic. Looking for handicapped housing for immediate move in.   Social Determinants of Health   Financial Resource Strain: High Risk (11/01/2021)   Overall Financial Resource Strain (CARDIA)    Difficulty of Paying Living Expenses: Hard  Food Insecurity: No Food Insecurity (11/01/2021)   Hunger Vital Sign    Worried About Running Out of Food in the Last Year: Never true    Ran Out of Food in the Last Year: Never true  Transportation Needs: No Transportation Needs (11/01/2021)   PRAPARE - Hydrologist (Medical): No    Lack of Transportation (Non-Medical): No  Physical Activity: Insufficiently Active (11/01/2021)   Exercise Vital  Sign    Days of Exercise per Week: 1 day    Minutes of Exercise per Session: 20 min  Stress: No Stress Concern Present (11/01/2021)   [place]    Feeling of Stress : Not at all  Social Connections: Socially Isolated (11/01/2021)   Social Connection and Isolation Panel [NHANES]    Frequency of Communication with Friends and Family: More than three times a week    Frequency of Social Gatherings with Friends and Family: More than three times a week    Attends Religious Services: Never    Marine scientist or Organizations: No    Attends Archivist Meetings: Never    Marital Status: Divorced     Family History: The patient's family history is negative for Colon cancer.  ROS:   Please see the history of present illness.     EKGs/Labs/Other Studies Reviewed:    EKG:  The ekg ordered today demonstrates ***  Recent Labs: 10/08/2021: B Natriuretic Peptide 1,688.9 10/09/2021: TSH 10.025 11/13/2021: Magnesium 2.0 11/28/2021: ALT 16; BUN 25; Creatinine, Ser 6.42; Hemoglobin 7.5; Platelets 121; Potassium 3.6; Sodium 132   Recent Lipid Panel Lab Results  Component Value Date/Time   CHOL 103 12/28/2019 04:40 PM   TRIG 235 (H) 12/28/2019 04:40 PM   HDL 35 (L) 12/28/2019 04:40 PM   LDLCALC 32 12/28/2019 04:40 PM    Physical Exam:    VS:  There were no vitals taken for this visit.   No data found.       Wt Readings from Last 3 Encounters:  11/28/21 240 lb (108.9 kg)  11/13/21 233 lb 7.5 oz (105.9 kg)  11/07/21 220 lb (99.8 kg)     GEN: *** Well nourished, well developed in no acute distress HEENT: Normal NECK: No JVD; No carotid bruits CARDIAC: ***RRR, no murmurs, rubs, gallops RESPIRATORY:  Clear to auscultation without rales, wheezing or rhonchi  ABDOMEN: Soft, non-tender, non-distended MUSCULOSKELETAL: No edema; No deformity  SKIN: Warm and dry NEUROLOGIC:  Alert and oriented PSYCHIATRIC:  Normal  affect     ASSESSMENT AND PLAN   ***   {Are you ordering a CV Procedure (e.g. stress test, cath, DCCV, TEE, etc)?   Press F2        :[id number]}    Medication Adjustments/Labs and Tests Ordered: Current medicines are reviewed at length with  the patient today.  Concerns regarding medicines are outlined above.  No orders of the defined types were placed in this encounter.  No orders of the defined types were placed in this encounter.   There are no Patient Instructions on file for this visit.   Signed, Warren Lacy, PA-C  12/03/2021 9:56 PM    [facility]

## 2021-12-03 NOTE — ED Notes (Signed)
Pt stated he could not wait for 7 hours and is going to go home. Pt said he cannot miss dialysis in the morning.

## 2021-12-03 NOTE — ED Triage Notes (Signed)
Pt c/o SHOB since "today." Dialysis MWF, no dialysis since last Friday. Endorses "some" associated CP, denies N/V. Pt speaking complete sentences in triage, NAD noted.

## 2021-12-03 NOTE — ED Provider Triage Note (Signed)
Emergency Medicine Provider Triage Evaluation Note  Daniel [name] , a 61 y.o. male  was evaluated in triage.  Pt complains of shortness of breath that started today.  Patient is a dialysis patient goes Monday Wednesday Friday, however he did not go to his dialysis on Friday.  He states that he was unable to get a ride.  He is also having some abdominal discomfort from distention.  Denies nausea and vomiting.  Review of Systems  Positive:  Negative:   Physical Exam  BP (!) 143/81   Pulse 82   Temp 97.8 F (36.6 C) (Oral)   Resp 18   SpO2 96%  Gen:   Awake, no distress   Resp:  Accessory muscle use is noted, speaking in full complete sentences.  No wheezing rales or rhonchi MSK:   Moves extremities without difficulty  Other:  Abdomen is soft, protuberant, mildly tender to palpation  Medical Decision Making  Medically screening exam initiated at 9:29 PM.  Appropriate orders placed.  Daniel [name] was informed that the remainder of the evaluation will be completed by another provider, this initial triage assessment does not replace that evaluation, and the importance of remaining in the ED until their evaluation is complete.  Patient stated that he needs something to help him breathe.  There is no wheezing or rales.  He is satting at 96% on room air.    Tonye Pearson, [place] 12/03/21 2131

## 2021-12-04 ENCOUNTER — Ambulatory Visit: Payer: Medicare Other | Admitting: Physician Assistant

## 2021-12-05 ENCOUNTER — Encounter: Payer: Self-pay | Admitting: *Deleted

## 2021-12-09 ENCOUNTER — Emergency Department (HOSPITAL_COMMUNITY): Payer: Medicare Other

## 2021-12-09 ENCOUNTER — Telehealth: Payer: Self-pay

## 2021-12-09 ENCOUNTER — Inpatient Hospital Stay (HOSPITAL_COMMUNITY)
Admission: EM | Admit: 2021-12-09 | Discharge: 2021-12-11 | DRG: 377 | Disposition: A | Discharge status: Home / Self Care | Payer: Medicare Other | Attending: Internal Medicine | Admitting: Internal Medicine

## 2021-12-09 ENCOUNTER — Other Ambulatory Visit: Payer: Self-pay

## 2021-12-09 ENCOUNTER — Encounter (HOSPITAL_COMMUNITY): Payer: Self-pay

## 2021-12-09 DIAGNOSIS — E871 Hypo-osmolality and hyponatremia: Secondary | ICD-10-CM | POA: Diagnosis present

## 2021-12-09 DIAGNOSIS — Z91199 Patient's noncompliance with other medical treatment and regimen due to unspecified reason: Secondary | ICD-10-CM

## 2021-12-09 DIAGNOSIS — I120 Hypertensive chronic kidney disease with stage 5 chronic kidney disease or end stage renal disease: Secondary | ICD-10-CM | POA: Diagnosis present

## 2021-12-09 DIAGNOSIS — I739 Peripheral vascular disease, unspecified: Secondary | ICD-10-CM | POA: Diagnosis not present

## 2021-12-09 DIAGNOSIS — I10 Essential (primary) hypertension: Secondary | ICD-10-CM | POA: Diagnosis not present

## 2021-12-09 DIAGNOSIS — N2581 Secondary hyperparathyroidism of renal origin: Secondary | ICD-10-CM | POA: Diagnosis present

## 2021-12-09 DIAGNOSIS — K31811 Angiodysplasia of stomach and duodenum with bleeding: Secondary | ICD-10-CM | POA: Diagnosis present

## 2021-12-09 DIAGNOSIS — D62 Acute posthemorrhagic anemia: Secondary | ICD-10-CM | POA: Diagnosis present

## 2021-12-09 DIAGNOSIS — E1151 Type 2 diabetes mellitus with diabetic peripheral angiopathy without gangrene: Secondary | ICD-10-CM | POA: Diagnosis present

## 2021-12-09 DIAGNOSIS — Z885 Allergy status to narcotic agent status: Secondary | ICD-10-CM | POA: Diagnosis not present

## 2021-12-09 DIAGNOSIS — R0602 Shortness of breath: Secondary | ICD-10-CM | POA: Diagnosis not present

## 2021-12-09 DIAGNOSIS — Z888 Allergy status to other drugs, medicaments and biological substances status: Secondary | ICD-10-CM | POA: Diagnosis not present

## 2021-12-09 DIAGNOSIS — F5104 Psychophysiologic insomnia: Secondary | ICD-10-CM | POA: Diagnosis present

## 2021-12-09 DIAGNOSIS — D500 Iron deficiency anemia secondary to blood loss (chronic): Secondary | ICD-10-CM

## 2021-12-09 DIAGNOSIS — N186 End stage renal disease: Secondary | ICD-10-CM | POA: Diagnosis present

## 2021-12-09 DIAGNOSIS — K5521 Angiodysplasia of colon with hemorrhage: Secondary | ICD-10-CM | POA: Diagnosis not present

## 2021-12-09 DIAGNOSIS — F17210 Nicotine dependence, cigarettes, uncomplicated: Secondary | ICD-10-CM | POA: Diagnosis present

## 2021-12-09 DIAGNOSIS — E669 Obesity, unspecified: Secondary | ICD-10-CM | POA: Diagnosis present

## 2021-12-09 DIAGNOSIS — K7460 Unspecified cirrhosis of liver: Secondary | ICD-10-CM | POA: Diagnosis present

## 2021-12-09 DIAGNOSIS — Z992 Dependence on renal dialysis: Secondary | ICD-10-CM

## 2021-12-09 DIAGNOSIS — Z89512 Acquired absence of left leg below knee: Secondary | ICD-10-CM

## 2021-12-09 DIAGNOSIS — Z6833 Body mass index (BMI) 33.0-33.9, adult: Secondary | ICD-10-CM

## 2021-12-09 DIAGNOSIS — I8510 Secondary esophageal varices without bleeding: Secondary | ICD-10-CM | POA: Diagnosis present

## 2021-12-09 DIAGNOSIS — C220 Liver cell carcinoma: Secondary | ICD-10-CM | POA: Diagnosis present

## 2021-12-09 DIAGNOSIS — Z91158 Patient's noncompliance with renal dialysis for other reason: Secondary | ICD-10-CM

## 2021-12-09 DIAGNOSIS — Z79899 Other long term (current) drug therapy: Secondary | ICD-10-CM | POA: Diagnosis not present

## 2021-12-09 DIAGNOSIS — E8770 Fluid overload, unspecified: Secondary | ICD-10-CM | POA: Diagnosis present

## 2021-12-09 DIAGNOSIS — D649 Anemia, unspecified: Secondary | ICD-10-CM

## 2021-12-09 DIAGNOSIS — D696 Thrombocytopenia, unspecified: Secondary | ICD-10-CM | POA: Diagnosis present

## 2021-12-09 DIAGNOSIS — E785 Hyperlipidemia, unspecified: Secondary | ICD-10-CM | POA: Diagnosis not present

## 2021-12-09 DIAGNOSIS — E8779 Other fluid overload: Secondary | ICD-10-CM | POA: Diagnosis not present

## 2021-12-09 DIAGNOSIS — B1920 Unspecified viral hepatitis C without hepatic coma: Secondary | ICD-10-CM | POA: Diagnosis present

## 2021-12-09 DIAGNOSIS — E1122 Type 2 diabetes mellitus with diabetic chronic kidney disease: Secondary | ICD-10-CM | POA: Diagnosis present

## 2021-12-09 DIAGNOSIS — Z993 Dependence on wheelchair: Secondary | ICD-10-CM

## 2021-12-09 DIAGNOSIS — D631 Anemia in chronic kidney disease: Secondary | ICD-10-CM | POA: Diagnosis present

## 2021-12-09 DIAGNOSIS — Z8673 Personal history of transient ischemic attack (TIA), and cerebral infarction without residual deficits: Secondary | ICD-10-CM

## 2021-12-09 DIAGNOSIS — Z7189 Other specified counseling: Secondary | ICD-10-CM | POA: Diagnosis not present

## 2021-12-09 DIAGNOSIS — R630 Anorexia: Secondary | ICD-10-CM | POA: Diagnosis present

## 2021-12-09 LAB — PROTIME-INR
INR: 1.1 (ref 0.8–1.2)
Prothrombin Time: 14 seconds (ref 11.4–15.2)

## 2021-12-09 LAB — COMPREHENSIVE METABOLIC PANEL WITH GFR
ALT: 13 U/L (ref 0–44)
AST: 38 U/L (ref 15–41)
Albumin: 2.5 g/dL — ABNORMAL LOW (ref 3.5–5.0)
Alkaline Phosphatase: 109 U/L (ref 38–126)
Anion gap: 15 (ref 5–15)
BUN: 47 mg/dL — ABNORMAL HIGH (ref 8–23)
CO2: 15 mmol/L — ABNORMAL LOW (ref 22–32)
Calcium: 7.7 mg/dL — ABNORMAL LOW (ref 8.9–10.3)
Chloride: 83 mmol/L — ABNORMAL LOW (ref 98–111)
Creatinine, Ser: 11.12 mg/dL — ABNORMAL HIGH (ref 0.61–1.24)
GFR, Estimated: 5 mL/min — ABNORMAL LOW
Glucose, Bld: 92 mg/dL (ref 70–99)
Potassium: 4.6 mmol/L (ref 3.5–5.1)
Sodium: 113 mmol/L — CL (ref 135–145)
Total Bilirubin: 1.1 mg/dL (ref 0.3–1.2)
Total Protein: 7.8 g/dL (ref 6.5–8.1)

## 2021-12-09 LAB — BASIC METABOLIC PANEL WITH GFR
Anion gap: 15 (ref 5–15)
BUN: 46 mg/dL — ABNORMAL HIGH (ref 8–23)
CO2: 17 mmol/L — ABNORMAL LOW (ref 22–32)
Calcium: 7.7 mg/dL — ABNORMAL LOW (ref 8.9–10.3)
Chloride: 82 mmol/L — ABNORMAL LOW (ref 98–111)
Creatinine, Ser: 11.07 mg/dL — ABNORMAL HIGH (ref 0.61–1.24)
GFR, Estimated: 5 mL/min — ABNORMAL LOW
Glucose, Bld: 93 mg/dL (ref 70–99)
Potassium: 4.3 mmol/L (ref 3.5–5.1)
Sodium: 114 mmol/L — CL (ref 135–145)

## 2021-12-09 LAB — APTT: aPTT: 38 seconds — ABNORMAL HIGH (ref 24–36)

## 2021-12-09 LAB — URINALYSIS, ROUTINE W REFLEX MICROSCOPIC
Bacteria, UA: NONE SEEN
Bilirubin Urine: NEGATIVE
Glucose, UA: NEGATIVE mg/dL
Ketones, ur: NEGATIVE mg/dL
Leukocytes,Ua: NEGATIVE
Nitrite: NEGATIVE
Protein, ur: 100 mg/dL — AB
Specific Gravity, Urine: 1.008 (ref 1.005–1.030)
pH: 5 (ref 5.0–8.0)

## 2021-12-09 LAB — GLUCOSE, CAPILLARY
Glucose-Capillary: 65 mg/dL — ABNORMAL LOW (ref 70–99)
Glucose-Capillary: 87 mg/dL (ref 70–99)
Glucose-Capillary: 114 mg/dL — ABNORMAL HIGH (ref 70–99)

## 2021-12-09 LAB — CBC WITH DIFFERENTIAL/PLATELET
Abs Immature Granulocytes: 0.03 10*3/uL (ref 0.00–0.07)
Basophils Absolute: 0 10*3/uL (ref 0.0–0.1)
Basophils Relative: 0 %
Eosinophils Absolute: 0 10*3/uL (ref 0.0–0.5)
Eosinophils Relative: 1 %
HCT: 14.9 % — ABNORMAL LOW (ref 39.0–52.0)
Hemoglobin: 5.1 g/dL — CL (ref 13.0–17.0)
Immature Granulocytes: 1 %
Lymphocytes Relative: 8 %
Lymphs Abs: 0.3 10*3/uL — ABNORMAL LOW (ref 0.7–4.0)
MCH: 34.2 pg — ABNORMAL HIGH (ref 26.0–34.0)
MCHC: 34.2 g/dL (ref 30.0–36.0)
MCV: 100 fL (ref 80.0–100.0)
Monocytes Absolute: 0.6 10*3/uL (ref 0.1–1.0)
Monocytes Relative: 14 %
Neutro Abs: 3.1 10*3/uL (ref 1.7–7.7)
Neutrophils Relative %: 76 %
Platelets: 104 10*3/uL — ABNORMAL LOW (ref 150–400)
RBC: 1.49 MIL/uL — ABNORMAL LOW (ref 4.22–5.81)
RDW: 15.6 % — ABNORMAL HIGH (ref 11.5–15.5)
WBC: 4 10*3/uL (ref 4.0–10.5)
nRBC: 0 % (ref 0.0–0.2)

## 2021-12-09 LAB — HEPATIC FUNCTION PANEL
ALT: 14 U/L (ref 0–44)
AST: 40 U/L (ref 15–41)
Albumin: 2.4 g/dL — ABNORMAL LOW (ref 3.5–5.0)
Alkaline Phosphatase: 104 U/L (ref 38–126)
Bilirubin, Direct: 0.6 mg/dL — ABNORMAL HIGH (ref 0.0–0.2)
Indirect Bilirubin: 0.7 mg/dL (ref 0.3–0.9)
Total Bilirubin: 1.3 mg/dL — ABNORMAL HIGH (ref 0.3–1.2)
Total Protein: 7.6 g/dL (ref 6.5–8.1)

## 2021-12-09 LAB — HEMOGLOBIN AND HEMATOCRIT, BLOOD
HCT: 31.1 % — ABNORMAL LOW (ref 39.0–52.0)
Hemoglobin: 10.3 g/dL — ABNORMAL LOW (ref 13.0–17.0)

## 2021-12-09 LAB — HEPATITIS C ANTIBODY: HCV Ab: REACTIVE — AB

## 2021-12-09 LAB — PHOSPHORUS: Phosphorus: 3.7 mg/dL (ref 2.5–4.6)

## 2021-12-09 LAB — HEPATITIS B SURFACE ANTIBODY,QUALITATIVE: Hep B S Ab: REACTIVE — AB

## 2021-12-09 LAB — SODIUM
Sodium: 129 mmol/L — ABNORMAL LOW (ref 135–145)
Sodium: 129 mmol/L — ABNORMAL LOW (ref 135–145)

## 2021-12-09 LAB — SODIUM, URINE, RANDOM: Sodium, Ur: 20 mmol/L

## 2021-12-09 LAB — PREPARE RBC (CROSSMATCH)

## 2021-12-09 LAB — OSMOLALITY, URINE: Osmolality, Ur: 153 mOsm/kg — ABNORMAL LOW (ref 300–900)

## 2021-12-09 LAB — POC OCCULT BLOOD, ED: Fecal Occult Bld: POSITIVE — AB

## 2021-12-09 LAB — HEPATITIS B SURFACE ANTIGEN: Hepatitis B Surface Ag: NONREACTIVE

## 2021-12-09 LAB — HEPATITIS B CORE ANTIBODY, TOTAL: Hep B Core Total Ab: REACTIVE — AB

## 2021-12-09 LAB — OSMOLALITY: Osmolality: 277 mOsm/kg (ref 275–295)

## 2021-12-09 LAB — MAGNESIUM: Magnesium: 1.6 mg/dL — ABNORMAL LOW (ref 1.7–2.4)

## 2021-12-09 MED ORDER — ORAL CARE MOUTH RINSE: 15.0000 mL | OROMUCOSAL | Status: DC | PRN

## 2021-12-09 MED ORDER — AMLODIPINE BESYLATE 5 MG PO TABS
5.0000 mg | ORAL_TABLET | Freq: Every day | ORAL | Status: DC
  Administered 2021-12-09 – 2021-12-11 (×3): 5 mg via ORAL
  Filled 2021-12-09 (×3): qty 1

## 2021-12-09 MED ORDER — ATORVASTATIN CALCIUM 40 MG PO TABS
40.0000 mg | ORAL_TABLET | Freq: Every day | ORAL | Status: DC
  Administered 2021-12-09 – 2021-12-11 (×3): 40 mg via ORAL
  Filled 2021-12-09 (×3): qty 1

## 2021-12-09 MED ORDER — PANTOPRAZOLE SODIUM 40 MG PO TBEC
40.0000 mg | DELAYED_RELEASE_TABLET | Freq: Two times a day (BID) | ORAL | Status: DC
  Administered 2021-12-09 – 2021-12-11 (×5): 40 mg via ORAL
  Filled 2021-12-09 (×5): qty 1

## 2021-12-09 MED ORDER — ALBUTEROL SULFATE (2.5 MG/3ML) 0.083% IN NEBU
2.5000 mg | INHALATION_SOLUTION | Freq: Four times a day (QID) | RESPIRATORY_TRACT | Status: DC | PRN
  Administered 2021-12-09 – 2021-12-10 (×4): 2.5 mg via RESPIRATORY_TRACT
  Filled 2021-12-09 (×4): qty 3

## 2021-12-09 MED ORDER — GABAPENTIN 300 MG PO CAPS
300.0000 mg | ORAL_CAPSULE | Freq: Every day | ORAL | Status: DC
Start: 2021-12-09 — End: 2021-12-11
  Administered 2021-12-09 – 2021-12-10 (×2): 300 mg via ORAL
  Filled 2021-12-09 (×2): qty 1

## 2021-12-09 MED ORDER — THIAMINE HCL 100 MG PO TABS
100.0000 mg | ORAL_TABLET | Freq: Every day | ORAL | Status: DC
Start: 2021-12-09 — End: 2021-12-11
  Administered 2021-12-09 – 2021-12-11 (×3): 100 mg via ORAL
  Filled 2021-12-09 (×3): qty 1

## 2021-12-09 MED ORDER — SODIUM CHLORIDE 0.9 % IV BOLUS
250.0000 mL | Freq: Once | INTRAVENOUS | Status: AC
  Administered 2021-12-09: 250 mL via INTRAVENOUS

## 2021-12-09 MED ORDER — CHLORHEXIDINE GLUCONATE CLOTH 2 % EX PADS
6.0000 | MEDICATED_PAD | Freq: Every day | CUTANEOUS | Status: DC
  Administered 2021-12-09: 6 via TOPICAL

## 2021-12-09 MED ORDER — GUAIFENESIN-DM 100-10 MG/5ML PO SYRP
5.0000 mL | ORAL_SOLUTION | ORAL | Status: DC | PRN
Start: 2021-12-09 — End: 2021-12-11
  Administered 2021-12-09 – 2021-12-10 (×4): 5 mL via ORAL
  Filled 2021-12-09 (×4): qty 5

## 2021-12-09 MED ORDER — DIPHENHYDRAMINE HCL 25 MG PO CAPS
25.0000 mg | ORAL_CAPSULE | Freq: Three times a day (TID) | ORAL | Status: DC | PRN
Start: 2021-12-09 — End: 2021-12-11
  Administered 2021-12-09 – 2021-12-10 (×3): 25 mg via ORAL
  Filled 2021-12-09 (×3): qty 1

## 2021-12-09 MED ORDER — FOLIC ACID 1 MG PO TABS
1.0000 mg | ORAL_TABLET | Freq: Every day | ORAL | Status: DC
Start: 2021-12-09 — End: 2021-12-11
  Administered 2021-12-09 – 2021-12-11 (×3): 1 mg via ORAL
  Filled 2021-12-09 (×3): qty 1

## 2021-12-09 MED ORDER — TRAZODONE HCL 50 MG PO TABS
50.0000 mg | ORAL_TABLET | Freq: Every day | ORAL | Status: DC
Start: 2021-12-09 — End: 2021-12-11
  Administered 2021-12-09 – 2021-12-10 (×2): 50 mg via ORAL
  Filled 2021-12-09 (×2): qty 1

## 2021-12-09 MED ORDER — DARBEPOETIN ALFA 200 MCG/0.4ML IJ SOSY: 200.0000 ug | PREFILLED_SYRINGE | INTRAMUSCULAR | Status: DC

## 2021-12-09 MED ORDER — SODIUM CHLORIDE 0.9 % IV SOLN: INTRAVENOUS | Status: DC

## 2021-12-09 MED ORDER — SODIUM CHLORIDE 0.9% FLUSH
3.0000 mL | Freq: Two times a day (BID) | INTRAVENOUS | Status: DC
  Administered 2021-12-09 – 2021-12-11 (×4): 3 mL via INTRAVENOUS

## 2021-12-09 MED ORDER — LORAZEPAM 2 MG/ML IJ SOLN
0.5000 mg | Freq: Once | INTRAMUSCULAR | Status: DC
Start: 2021-12-09 — End: 2021-12-11

## 2021-12-09 MED ORDER — ADULT MULTIVITAMIN W/MINERALS CH
1.0000 | ORAL_TABLET | Freq: Every day | ORAL | Status: DC
  Administered 2021-12-09 – 2021-12-11 (×3): 1 via ORAL
  Filled 2021-12-09 (×3): qty 1

## 2021-12-09 MED ORDER — LEVALBUTEROL HCL 0.63 MG/3ML IN NEBU: 0.6300 mg | INHALATION_SOLUTION | Freq: Four times a day (QID) | RESPIRATORY_TRACT | Status: DC | PRN

## 2021-12-09 MED ORDER — SODIUM CHLORIDE 0.9 % IV SOLN
10.0000 mL/h | Freq: Once | INTRAVENOUS | Status: AC
  Administered 2021-12-09: 10 mL/h via INTRAVENOUS

## 2021-12-09 MED ORDER — HYDROCOD POLI-CHLORPHE POLI ER 10-8 MG/5ML PO SUER
5.0000 mL | Freq: Two times a day (BID) | ORAL | Status: DC | PRN
  Administered 2021-12-09: 5 mL via ORAL
  Filled 2021-12-09: qty 5

## 2021-12-09 MED ORDER — PANTOPRAZOLE SODIUM 40 MG IV SOLR: 40.0000 mg | Freq: Two times a day (BID) | INTRAVENOUS | Status: DC

## 2021-12-09 NOTE — Progress Notes (Signed)
Received patient in bed to unit.  Alert and oriented.  Informed consent signed and in  chart.   Treatment initiated: 0924 Treatment completed: 1330  Patient tolerated well.  Transported back to the room  alert, without acute distress.  Hand-off given to patient's nurse.   Access used: Fistula Access issues: none  Total UF removed: 4L Medication(s) given: Albuterol neb Post HD VS: 170/152,98.9,80,20 Post HD weight: Unable to weigh, stretcher from ed had no scale.   [facility]

## 2021-12-09 NOTE — ED Provider Triage Note (Signed)
Emergency Medicine Provider Triage Evaluation Note  Daniel [name] , a 61 y.o. male  was evaluated in triage.  Pt complains of penile bleeding.  Denies trauma  to the groin.  No blood in urine/stool reported.  He is not currently on anticoagulation.  Does have hx of ESRD on HD, hx of hepatocellular carcinoma.  Review of Systems  Positive: bleeding Negative: Abdominal pain  Physical Exam  BP (!) 147/81   Pulse 79   Temp 98.3 F (36.8 C) (Oral)   Resp 16   SpO2 94%  Gen:   Awake, no distress   Resp:  Normal effort  MSK:   Moves extremities without difficulty  Other:  Genital exam deferred in triage  Medical Decision Making  Medically screening exam initiated at 1:16 AM.  Appropriate orders placed.  Daniel [name] was informed that the remainder of the evaluation will be completed by another provider, this initial triage assessment does not replace that evaluation, and the importance of remaining in the ED until their evaluation is complete.  Atraumatic penile bleeding.  Denies any blood in urine or stool.  Not currently on anticoagulation.  He does have history of ESRD on HD and hepatocellular carcinoma.  We will check labs, coags, type and screen.  Will need dedicated full exam.   Larene Pickett, PA-C 12/09/21 0119

## 2021-12-09 NOTE — H&P (Signed)
History and Physical    Patient: Daniel [name] UXN:[id number] DOB: [birth year] DOA: 12/09/2021 DOS: the patient was seen and examined on 12/09/2021 PCP: [name], NP  Patient coming from: Home  Chief Complaint:  Chief Complaint  Patient presents with   abnormal bleeding   HPI: Daniel [name] is a 61 y.o. 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  Patient noted to be inconsistent historian.  Prior to presenting to the ER overnight he noticed bright red blood on the washcloth and in his underwear but was unsure where it came from.  Denied any blood in his stool or blood in urine.  When further questioned he denied any dark stool as well.  Uncertain as to how long this has been going on but based on history obtained from the ER physician it appears less than 24 hours.  He also reports that he was quite short of breath and states his last hemodialysis session was last Wednesday.  He has noticed increased swelling in his right lower extremity noting he has a prior left BKA.  He has had a nonproductive cough for several weeks.  He states his current short of breath is different than the type of shortness of breath that he has related to missed dialysis.  He is not reporting any orthopnea.  He is nonambulatory and wheelchair-bound and is not reporting any dyspnea on exertion while rolling around in his wheelchair.  In the ER he was found to be Hemoccult positive.  Platelets low at 110,000, initial hemoglobin was 5.1.  EDP has ordered 1 unit of PRBCs.  He appears quite volume overloaded noting significant edema and right lower extremity.  EP has notified the nephrology team who plans on  dialyzing the patient as soon as possible.  Pressure is elevated in the 140-160 range.  He has ranged from requiring nasal cannula oxygen to help with his dyspnea to having room air sats of 99% but with increased work of breathing.  He has improved on his left scapula that he reports is from scratching.  He denies any falls or other trauma.  Also reports issues with chronic insomnia and inability to eat secondary to anorexia.  Review of Systems: As mentioned in the history of present illness. All other systems reviewed and are negative.  Past Medical History:  Diagnosis Date   Anemia    Diabetes mellitus without complication [facility])    patient denies   Dialysis patient [facility])    End stage chronic kidney disease ([place])    Hypertension    ICH (intracerebral hemorrhage) ([place]) 05/20/2017   PAD (peripheral artery disease) ([facility])    Shoulder pain, left 06/28/2013   Past Surgical History:  Procedure Laterality Date   A/V FISTULAGRAM N/A 08/15/2020   Procedure: A/V FISTULAGRAM - Left Upper;  Surgeon: Cherre Robins, MD;  Location: [place] CV LAB;  Service: Cardiovascular;  Laterality: N/A;   ABDOMINAL AORTOGRAM W/LOWER EXTREMITY N/A 04/08/2021   Procedure: ABDOMINAL AORTOGRAM W/LOWER EXTREMITY;  Surgeon: Waynetta Sandy, MD;  Location: [place] CV LAB;  Service: Cardiovascular;  Laterality: N/A;   AMPUTATION Left 04/16/2021   Procedure: LEFT BELOW KNEE AMPUTATION;  Surgeon: Angelia Mould, MD;  Location: [place] OR;  Service: Vascular;  Laterality: Left;   AMPUTATION Left 06/17/2021   Procedure: REVISION AMPUTATION BELOW KNEE;  Surgeon: Broadus John, MD;  Location: [place];  Service: Vascular;  Laterality: Left;   APPENDECTOMY     APPLICATION OF WOUND VAC Left 06/17/2021   Procedure: APPLICATION OF WOUND VAC;  Surgeon: Broadus John, MD;  Location: [place];  Service: Vascular;  Laterality: Left;   AV FISTULA PLACEMENT Left 08/03/2019   Procedure: LEFT ARM ARTERIOVENOUS (AV)  CEPHALIC  FISTULA CREATION;  Surgeon: Waynetta Sandy, MD;  Location: [place];  Service: Vascular;  Laterality: Left;   BIOPSY  06/30/2019   Procedure: BIOPSY;  Surgeon: Ronald Lobo, MD;  Location: [facility] ENDOSCOPY;  Service: Endoscopy;;   BIOPSY  08/02/2019   Procedure: BIOPSY;  Surgeon: Yetta Flock, MD;  Location: [facility] ENDOSCOPY;  Service: Gastroenterology;;   BIOPSY  02/08/2021   Procedure: BIOPSY;  Surgeon: Sharyn Creamer, MD;  Location: [facility] ENDOSCOPY;  Service: Gastroenterology;;   BIOPSY  02/24/2021   Procedure: BIOPSY;  Surgeon: [name], MD;  Location: [facility] ENDOSCOPY;  Service: Gastroenterology;;   BIOPSY  03/26/2021   Procedure: BIOPSY;  Surgeon: Lavena Bullion, DO;  Location: [place];  Service: Gastroenterology;;   BIOPSY  08/06/2021   Procedure: BIOPSY;  Surgeon: [name], MD;  Location: [place];  Service: Gastroenterology;;   BIOPSY  09/15/2021   Procedure: BIOPSY;  Surgeon: Sharyn Creamer, MD;  Location: [place];  Service: Gastroenterology;;   COLONOSCOPY  01/23/2012   Procedure: COLONOSCOPY;  Surgeon: Danie Binder, MD;  [facility];  Service: Endoscopy;  Laterality: N/A;  11:10 AM   COLONOSCOPY WITH PROPOFOL N/A 06/30/2019   Procedure: COLONOSCOPY WITH PROPOFOL;  Surgeon: Ronald Lobo, MD;  Location: [place];  Service: Endoscopy;  Laterality: N/A;   ENTEROSCOPY N/A 08/02/2019   Procedure: ENTEROSCOPY;  Surgeon: Yetta Flock, MD;  Location: [facility] ENDOSCOPY;  Service: Gastroenterology;  Laterality: N/A;   ENTEROSCOPY N/A 02/24/2021   Procedure: ENTEROSCOPY;  Surgeon: [name], MD;  Location: [facility] ENDOSCOPY;  Service: Gastroenterology;  Laterality: N/A;   ENTEROSCOPY N/A 03/26/2021   Procedure: ENTEROSCOPY;  Surgeon: Lavena Bullion, DO;  Location: [place];  Service: Gastroenterology;  Laterality: N/A;   ENTEROSCOPY N/A 07/05/2021   Procedure: ENTEROSCOPY;  Surgeon: Carol Ada, MD;  Location:  [place];  Service: Gastroenterology;  Laterality: N/A;   ENTEROSCOPY N/A 08/06/2021   Procedure: ENTEROSCOPY;  Surgeon: [name], MD;  Location: [facility] ENDOSCOPY;  Service: Gastroenterology;  Laterality: N/A;   ENTEROSCOPY N/A 08/24/2021   Procedure: ENTEROSCOPY;  Surgeon: Ladene Artist, MD;  Location: [facility] ENDOSCOPY;  Service: Gastroenterology;  Laterality: N/A;   ENTEROSCOPY N/A 09/15/2021   Procedure: ENTEROSCOPY;  Surgeon: Sharyn Creamer, MD;  Location: [facility] ENDOSCOPY;  Service: Gastroenterology;  Laterality: N/A;   ENTEROSCOPY N/A 10/06/2021   Procedure: ENTEROSCOPY;  Surgeon: [name], MD;  Location: [facility] ENDOSCOPY;  Service: Gastroenterology;  Laterality: N/A;   ENTEROSCOPY N/A 11/14/2021   Procedure: ENTEROSCOPY;  Surgeon: Doran Stabler, MD;  Location: [facility] ENDOSCOPY;  Service: Gastroenterology;  Laterality: N/A;   ESOPHAGOGASTRODUODENOSCOPY N/A 08/10/2020   Procedure: ESOPHAGOGASTRODUODENOSCOPY (EGD);  Surgeon: Jerene Bears, MD;  Location: [facility] ENDOSCOPY;  Service: Gastroenterology;  Laterality: N/A;   ESOPHAGOGASTRODUODENOSCOPY (EGD) WITH PROPOFOL N/A 06/30/2019   Procedure: ESOPHAGOGASTRODUODENOSCOPY (EGD) WITH PROPOFOL;  Surgeon: Ronald Lobo, MD;  Location: [place];  Service: Endoscopy;  Laterality: N/A;  ESOPHAGOGASTRODUODENOSCOPY (EGD) WITH PROPOFOL N/A 01/12/2021   Procedure: ESOPHAGOGASTRODUODENOSCOPY (EGD) WITH PROPOFOL;  Surgeon: Sharyn Creamer, MD;  Location: [place];  Service: Gastroenterology;  Laterality: N/A;   ESOPHAGOGASTRODUODENOSCOPY (EGD) WITH PROPOFOL N/A 02/08/2021   Procedure: ESOPHAGOGASTRODUODENOSCOPY (EGD) WITH PROPOFOL;  Surgeon: Sharyn Creamer, MD;  Location: [place];  Service: Gastroenterology;  Laterality: N/A;   FISTULA SUPERFICIALIZATION Left 10/17/2019   Procedure: LEFT UPPER EXTREMITY FISTULA REVISION, SIDE BRANCH LIGATION,  AND SUPERFICIALIZATION;  Surgeon: Marty Heck, MD;  Location: [place];  Service: Vascular;   Laterality: Left;   FISTULA SUPERFICIALIZATION Left 74/25/9563   Procedure: PLICATION OF ANEURYSM LEFT ARTERIOVENOUS FISTULA;  Surgeon: Angelia Mould, MD;  Location: [place];  Service: Vascular;  Laterality: Left;   FLEXIBLE SIGMOIDOSCOPY N/A 07/05/2021   Procedure: FLEXIBLE SIGMOIDOSCOPY;  Surgeon: Carol Ada, MD;  Location: [place];  Service: Gastroenterology;  Laterality: N/A;   GIVENS CAPSULE STUDY N/A 06/30/2019   Procedure: GIVENS CAPSULE STUDY;  Surgeon: Ronald Lobo, MD;  Location: [place];  Service: Endoscopy;  Laterality: N/A;   GIVENS CAPSULE STUDY N/A 08/25/2021   Procedure: GIVENS CAPSULE STUDY;  Surgeon: Ladene Artist, MD;  Location: [place];  Service: Gastroenterology;  Laterality: N/A;   GIVENS CAPSULE STUDY N/A 10/06/2021   Procedure: GIVENS CAPSULE STUDY;  Surgeon: [name], MD;  Location: [place];  Service: Gastroenterology;  Laterality: N/A;   HEMOSTASIS CLIP PLACEMENT  08/10/2020   Procedure: HEMOSTASIS CLIP PLACEMENT;  Surgeon: Jerene Bears, MD;  Location: [place];  Service: Gastroenterology;;   HEMOSTASIS CLIP PLACEMENT  01/12/2021   Procedure: HEMOSTASIS CLIP PLACEMENT;  Surgeon: Sharyn Creamer, MD;  Location: [place];  Service: Gastroenterology;;   HEMOSTASIS CONTROL  08/02/2019   Procedure: HEMOSTASIS CONTROL;  Surgeon: Yetta Flock, MD;  Location: [place];  Service: Gastroenterology;;   HOT HEMOSTASIS  02/24/2021   Procedure: HOT HEMOSTASIS (ARGON PLASMA COAGULATION/BICAP);  Surgeon: [name], MD;  Location: [facility] ENDOSCOPY;  Service: Gastroenterology;;   HOT HEMOSTASIS N/A 03/26/2021   Procedure: HOT HEMOSTASIS (ARGON PLASMA COAGULATION/BICAP);  Surgeon: Lavena Bullion, DO;  Location: [facility] ENDOSCOPY;  Service: Gastroenterology;  Laterality: N/A;   HOT HEMOSTASIS N/A 07/05/2021   Procedure: HOT HEMOSTASIS (ARGON PLASMA COAGULATION/BICAP);  Surgeon: Carol Ada, MD;  Location: [place];   Service: Gastroenterology;  Laterality: N/A;   HOT HEMOSTASIS N/A 09/15/2021   Procedure: HOT HEMOSTASIS (ARGON PLASMA COAGULATION/BICAP);  Surgeon: Sharyn Creamer, MD;  Location: [place];  Service: Gastroenterology;  Laterality: N/A;   HOT HEMOSTASIS N/A 10/06/2021   Procedure: HOT HEMOSTASIS (ARGON PLASMA COAGULATION/BICAP);  Surgeon: [name], MD;  Location: [place];  Service: Gastroenterology;  Laterality: N/A;   INCISION AND DRAINAGE ABSCESS N/A 06/29/2016   Procedure: INCISION AND DRAINAGE ABDOMINAL WALL ABSCESS;  Surgeon: Alphonsa Overall, MD;  [facility];  Service: General;  Laterality: N/A;   INSERTION OF DIALYSIS CATHETER Right 08/03/2019   Procedure: INSERTION OF DIALYSIS CATHETER;  Surgeon: Waynetta Sandy, MD;  Location: [place];  Service: Vascular;  Laterality: Right;   INSERTION OF DIALYSIS CATHETER Right 10/22/2019   Procedure: INSERTION OF 23CM TUNNELED DIALYSIS CATHETER RIGHT INTERNAL JUGULAR;  Surgeon: Angelia Mould, MD;  Location: [place];  Service: Vascular;  Laterality: Right;   INSERTION OF DIALYSIS CATHETER Right 08/12/2020   Procedure: INSERTION OF Right internal Jugular TUNNELED  DIALYSIS CATHETER.;  Surgeon: Waynetta Sandy, MD;  Location: [place];  Service: Vascular;  Laterality: Right;  INSERTION OF DIALYSIS CATHETER N/A 04/19/2021   Procedure: INSERTION OF TUNNELED DIALYSIS CATHETER;  Surgeon: Angelia Mould, MD;  Location: [facility];  Service: Vascular;  Laterality: N/A;   IR 3D INDEPENDENT WKST  11/07/2021   IR ANGIOGRAM SELECTIVE EACH ADDITIONAL VESSEL  11/07/2021   IR ANGIOGRAM SELECTIVE EACH ADDITIONAL VESSEL  11/07/2021   IR ANGIOGRAM SELECTIVE EACH ADDITIONAL VESSEL  11/07/2021   IR ANGIOGRAM SELECTIVE EACH ADDITIONAL VESSEL  11/28/2021   IR ANGIOGRAM VISCERAL SELECTIVE  11/07/2021   IR ANGIOGRAM VISCERAL SELECTIVE  11/28/2021   IR EMBO ARTERIAL NOT HEMORR HEMANG INC GUIDE ROADMAPPING  11/07/2021   IR EMBO TUMOR ORGAN  ISCHEMIA INFARCT INC GUIDE ROADMAPPING  11/28/2021   IR RADIOLOGIST EVAL & MGMT  09/12/2021   IR US GUIDE VASC ACCESS LEFT  11/07/2021   IR US GUIDE VASC ACCESS LEFT  11/28/2021   Left heel surgery     LOWER EXTREMITY ANGIOGRAPHY N/A 07/08/2021   Procedure: Lower Extremity Angiography;  Surgeon: Cherre Robins, MD;  Location: [place] CV LAB;  Service: Cardiovascular;  Laterality: N/A;   PENILE BIOPSY N/A 03/26/2020   Procedure: PENILE ULCER DEBRIDEMENT;  Surgeon: Remi Haggard, MD;  [facility];  Service: Urology;  Laterality: N/A;  30 MINS   PERIPHERAL VASCULAR INTERVENTION Right 07/08/2021   Procedure: PERIPHERAL VASCULAR INTERVENTION;  Surgeon: Cherre Robins, MD;  Location: [place] CV LAB;  Service: Cardiovascular;  Laterality: Right;   SCLEROTHERAPY  01/12/2021   Procedure: SCLEROTHERAPY;  Surgeon: Sharyn Creamer, MD;  Location: [facility] ENDOSCOPY;  Service: Gastroenterology;;   Clide Deutscher  02/24/2021   Procedure: Clide Deutscher;  Surgeon: [name], MD;  Location: [place];  Service: Gastroenterology;;   SUBMUCOSAL TATTOO INJECTION  08/24/2021   Procedure: SUBMUCOSAL TATTOO INJECTION;  Surgeon: Ladene Artist, MD;  Location: [place];  Service: Gastroenterology;;   SUBMUCOSAL TATTOO INJECTION  09/15/2021   Procedure: SUBMUCOSAL TATTOO INJECTION;  Surgeon: Sharyn Creamer, MD;  Location: [facility] ENDOSCOPY;  Service: Gastroenterology;;   Social History:  reports that he has been smoking cigarettes. He has a 22.50 pack-year smoking history. He has never used smokeless tobacco. He reports that he does not currently use alcohol. He reports that he does not currently use drugs after having used the following drugs: "Crack" cocaine.  Allergies  Allergen Reactions   Seroquel [Quetiapine] Other (See Comments)    Tardive kinesia Dystonia    Dilaudid [Hydromorphone Hcl] Itching and Other (See Comments)    Pt reports itchiness after IM injection     Family History   Problem Relation Age of Onset   Colon cancer Neg Hx     Prior to Admission medications   Medication Sig Start Date End Date Taking? Authorizing Provider  amLODipine (NORVASC) 5 MG tablet Take 1 tablet (5 mg total) by mouth daily. 09/18/21 12/17/21  [place] Donnamarie Poag, DO  atorvastatin (LIPITOR) 40 MG tablet Take 1 tablet (40 mg total) by mouth daily. 09/18/21 12/17/21  [place] Donnamarie Poag, DO  gabapentin (NEURONTIN) 300 MG capsule Take 1 capsule (300 mg total) by mouth at bedtime. 11/15/21   Nita Sells, MD  pantoprazole (PROTONIX) 40 MG tablet Take 1 tablet (40 mg total) by mouth 2 (two) times daily before a meal. 10/10/21 01/08/22  Mercy Riding, MD  traZODone (DESYREL) 50 MG tablet Take 1 tablet (50 mg total) by mouth at bedtime as needed for sleep. 11/15/21   Nita Sells, MD  colchicine 0.6 MG tablet Take 0.5  tablets (0.3 mg total) by mouth 2 (two) times daily. 07/24/20 07/29/20  Noemi Chapel, MD  furosemide (LASIX) 40 MG tablet Take 1 tablet (40 mg total) by mouth daily. 07/25/19 02/09/20  Ladona Horns, MD    Physical Exam: Vitals:   12/09/21 0700 12/09/21 0745 12/09/21 0845 12/09/21 0852  BP: (!) 168/95 (!) 145/66 (!) 140/85   Pulse: 81 83 73   Resp: 14 (!) 23 19   Temp: 97.8 F (36.6 C)   97.6 F (36.4 C)  TempSrc: Oral     SpO2: 100% 98% 99%   Weight:      Height:       Constitutional: NAD, calm, uncomfortable 2/2 shortness of breath Eyes: PERRL, lids and conjunctivae normal-no scleral icterus noted Respiratory: Increased work of breathing with bilateral crackles on posterior exam.  Alternating between use of nasal cannula oxygen at 2 L/min and room air with sats maintaining greater than 98% although this in context of increased work of breathing. Cardiovascular: Regular rate and rhythm, no murmurs / rubs / gallops.  RLE edema 2-3+.  Prior left BKA Abdomen: no tenderness, no masses palpated.  Positive hepatomegaly.  Bowel sounds positive.  Musculoskeletal: no clubbing /  cyanosis. No joint deformity upper and lower extremities. Good ROM, no contractures. Normal muscle tone.  Skin: no rashes, lesions, ulcers. No induration Neurologic: CN 2-12 grossly intact. Sensation intact, Strength 4/5 x all extremities except weaker in LLE secondary to prior BKA.  Psychiatric: Seems to have some mild confusion probably secondary to hyponatremia.  Also told EDP that he was on blood thinner medications and according to med rec he is not taking any aspirin or other potentially blood thinning medications.  Alert. Normal mood.   Data Reviewed:  Laboratory data: Sodium 114, potassium 4.3, CO2 17, BUN 46 and creatinine 11.07, calcium 7.7, anion gap 15, GFR 5, white count 4000, hemoglobin 5.1 with hematocrit 14.9, platelets 104,000 , fecal occult blood positive  Chest x-ray: Stable cardiomegaly with mild mid left lung and mild bibasilar atelectasis and/or infiltrate.   EKG: sinus rhythm, QTc 477 ms, old inferior infarct, no acute changes  Assessment and Plan: Symptomatic anemia/history of GAVE and duodenal AVMs Current hemoglobin 5.1 with baseline between 7.1 and 7.5; EDP has initiated PRBCs and nephrology plans to transfuse at least 1 more unit during hemodialysis Patient with history of small bowel enteroscopy in June 2023 with notable friable gastric polyps status post APC.  He was initiated on PPI and this was cont'd upon DC. Stool in ER was Hemoccult positive so [facility] has been consulted Clear liquids only Continue Protonix but will give IV q 12hrs for now since his Hemoccult positive. Patient told EDP he was still taking blood thinner medications therefore he may still be taken aspirin or other medications at home.  This will need to be investigated further  Volume overload secondary to missed hemodialysis/CKD 5 on dialysis MWF Patient states last dialysis session was on Wednesday for has missed 1 dialysis session. Appears quite volume overloaded but denies dietary  indiscretion; if again edema RLE and notable for increased work of breathing and bilateral crackles on pulmonary exam Current weight is 240 lbs - dry weight unknown Nephrology has been consulted and plan is to proceed with HD today Patient reporting some difficulty with obtaining transportation to dialysis.  TOC consulted Corrected calcium is 8.9-we will check phosphorus and magnesium today  Abnormal chest x-ray Chest x-ray questioning mild mid left lung and mild basilar atelectasis-may be  more reflective of pronounced unilateral edema Patient does not have any signs of infection such as fever or elevated white count  Acute hyponatremia Current sodium 114 with average baseline anywhere from 121-132 Hyponatremia Labs in process Suspect hemodilution from volume overload as well as hypoalbuminemia from cirrhosis contributing Recommend repeating labs after dialysis  Known hepatocellular carcinoma with cirrhosis/thrombocytopenia Status post Y90 treatment in IR on 8/3 He has been evaluated by [name] but has not followed up postprocedure stating has not been given an appointment Palliative consultation placed Has chronic thrombocytopenia but LFTs are normal Current MELD score is 29  Hypertension On Norvasc prior to admission Current blood pressure elevated but will hold off on Norvasc until after dialysis  History of cerebral hemorrhage 2019 Lives alone unclear baseline mental status noting today having some trouble concentrating and giving history but this could be related to anemia and hyponatremia  Dyslipidemia Continue Lipitor since LFTs are normal  PAD with history of left BKA/prior right SFA stent March 2023 Patient wheelchair-bound and lives alone  Chronic insomnia Continue gabapentin and trazodone     Advance Care Planning:   Code Status: Full Code   Consults: Nephrology  Family Communication: Patient only  DVT prophylaxis: Hemoccult positive so we will use SCD to  RLE for now  Severity of Illness: The appropriate patient status for this patient is INPATIENT. Inpatient status is judged to be reasonable and necessary in order to provide the required intensity of service to ensure the patient's safety. The patient's presenting symptoms, physical exam findings, and initial radiographic and laboratory data in the context of their chronic comorbidities is felt to place them at high risk for further clinical deterioration. Furthermore, it is not anticipated that the patient will be medically stable for discharge from the hospital within 2 midnights of admission.   * I certify that at the point of admission it is my clinical judgment that the patient will require inpatient hospital care spanning beyond 2 midnights from the point of admission due to high intensity of service, high risk for further deterioration and high frequency of surveillance required.*  Author: Erin Hearing, NP 12/09/2021 8:54 AM  For on call review [email or web address].

## 2021-12-09 NOTE — ED Provider Notes (Signed)
[facility] Provider Note   CSN: [id number] Arrival date & time: 12/09/21  0056     History  Chief Complaint  Patient presents with   abnormal bleeding    Daniel [name] is a 61 y.o. male.  Patient with a history of hepatocellular carcinoma, ESRD on dialysis Monday, Wednesday and Friday here with bleeding from his genital area.  States he was washing up tonight when he noticed bright red blood on the washcloth in his underwear.  Does not know where it was coming from.  Denies any fall or trauma.  States he does take a blood thinner but does not know which one.  Denies any blood in his stool or blood in his urine.  Does make some urine.  He is due for dialysis in the morning is feeling somewhat short of breath.  Denies chest pain, cough, fever, chills, nausea or vomiting.  No fever. Denies any trauma to the area.  The history is provided by the patient.       Home Medications Prior to Admission medications   Medication Sig Start Date End Date Taking? Authorizing Provider  amLODipine (NORVASC) 5 MG tablet Take 1 tablet (5 mg total) by mouth daily. 09/18/21 12/17/21  [place] Donnamarie Poag, DO  atorvastatin (LIPITOR) 40 MG tablet Take 1 tablet (40 mg total) by mouth daily. 09/18/21 12/17/21  [place] Donnamarie Poag, DO  gabapentin (NEURONTIN) 300 MG capsule Take 1 capsule (300 mg total) by mouth at bedtime. 11/15/21   Nita Sells, MD  pantoprazole (PROTONIX) 40 MG tablet Take 1 tablet (40 mg total) by mouth 2 (two) times daily before a meal. 10/10/21 01/08/22  Mercy Riding, MD  traZODone (DESYREL) 50 MG tablet Take 1 tablet (50 mg total) by mouth at bedtime as needed for sleep. 11/15/21   Nita Sells, MD  colchicine 0.6 MG tablet Take 0.5 tablets (0.3 mg total) by mouth 2 (two) times daily. 07/24/20 07/29/20  Noemi Chapel, MD  furosemide (LASIX) 40 MG tablet Take 1 tablet (40 mg total) by mouth daily. 07/25/19 02/09/20  Ladona Horns, MD       Allergies    Seroquel [quetiapine] and Dilaudid [hydromorphone hcl]    Review of Systems   Review of Systems  Respiratory:  Positive for shortness of breath. Negative for cough and chest tightness.   Genitourinary:  Negative for dysuria and hematuria.   all other systems are negative except as noted in the HPI and PMH.    Physical Exam Updated Vital Signs BP (!) 157/76 (BP Location: Right Arm)   Pulse 76   Temp 97.6 F (36.4 C) (Oral)   Resp 17   Ht '5\' 11"'$  (1.803 m)   Wt 108.9 kg   SpO2 99%   BMI 33.48 kg/m  Physical Exam Vitals and nursing note reviewed.  Constitutional:      General: He is not in acute distress.    Appearance: He is well-developed. He is not ill-appearing.  HENT:     Head: Normocephalic and atraumatic.     Mouth/Throat:     Pharynx: No oropharyngeal exudate.  Eyes:     Conjunctiva/sclera: Conjunctivae normal.     Pupils: Pupils are equal, round, and reactive to light.  Neck:     Comments: No meningismus. Cardiovascular:     Rate and Rhythm: Normal rate and regular rhythm.     Heart sounds: Normal heart sounds. No murmur heard. Pulmonary:     Effort: Pulmonary effort is  normal. No respiratory distress.     Breath sounds: Normal breath sounds.  Chest:     Chest wall: No tenderness.  Abdominal:     Palpations: Abdomen is soft.     Tenderness: There is no abdominal tenderness. There is no guarding or rebound.  Genitourinary:    Comments: Patient has a bloodsoaked paper towel in his underwear.  There is no bleeding from his penis.  There appears to be a small wound and abrasion to his right scrotum which is likely the source of the blood.  No gross blood on rectal exam.  No hemorrhoids or external fissures Musculoskeletal:        General: No tenderness. Normal range of motion.     Cervical back: Normal range of motion and neck supple.     Right lower leg: Edema present.     Left lower leg: Edema present.     Comments: Left BKA LUE fistula  with thrill  Skin:    General: Skin is warm.  Neurological:     Mental Status: He is alert and oriented to person, place, and time.     Cranial Nerves: No cranial nerve deficit.     Motor: No abnormal muscle tone.     Coordination: Coordination normal.     Comments:  5/5 strength throughout. CN 2-12 intact.Equal grip strength.   Psychiatric:        Behavior: Behavior normal.     ED Results / Procedures / Treatments   Labs (all labs ordered are listed, but only abnormal results are displayed) Labs Reviewed  CBC WITH DIFFERENTIAL/PLATELET - Abnormal; Notable for the following components:      Result Value   RBC 1.49 (*)    Hemoglobin 5.1 (*)    HCT 14.9 (*)    MCH 34.2 (*)    RDW 15.6 (*)    Platelets 104 (*)    Lymphs Abs 0.3 (*)    All other components within normal limits  COMPREHENSIVE METABOLIC PANEL - Abnormal; Notable for the following components:   Sodium 113 (*)    Chloride 83 (*)    CO2 15 (*)    BUN 47 (*)    Creatinine, Ser 11.12 (*)    Calcium 7.7 (*)    Albumin 2.5 (*)    GFR, Estimated 5 (*)    All other components within normal limits  APTT - Abnormal; Notable for the following components:   aPTT 38 (*)    All other components within normal limits  BASIC METABOLIC PANEL - Abnormal; Notable for the following components:   Sodium 114 (*)    Chloride 82 (*)    CO2 17 (*)    BUN 46 (*)    Creatinine, Ser 11.07 (*)    Calcium 7.7 (*)    GFR, Estimated 5 (*)    All other components within normal limits  POC OCCULT BLOOD, ED - Abnormal; Notable for the following components:   Fecal Occult Bld POSITIVE (*)    All other components within normal limits  PROTIME-INR  URINALYSIS, ROUTINE W REFLEX MICROSCOPIC  SODIUM  SODIUM  SODIUM  SODIUM  SODIUM, URINE, RANDOM  OSMOLALITY, URINE  OSMOLALITY  HEPATITIS B SURFACE ANTIGEN  HEPATITIS B SURFACE ANTIBODY,QUALITATIVE  HEPATITIS B SURFACE ANTIBODY, QUANTITATIVE  HEPATITIS B CORE ANTIBODY, TOTAL  HEPATITIS  C ANTIBODY  TYPE AND SCREEN  PREPARE RBC (CROSSMATCH)    EKG EKG Interpretation  Date/Time:  Monday December 09 2021 05:17:21 EDT Ventricular Rate:  77 PR Interval:  214 QRS Duration: 98 QT Interval:  421 QTC Calculation: 477 R Axis:   75 Text Interpretation: Sinus tachycardia Multiform ventricular premature complexes Borderline prolonged PR interval Anterior infarct, old No significant change was found Confirmed by Ezequiel Essex [phone]) on 12/09/2021 5:35:38 AM  Radiology DG Chest Portable 1 View  Result Date: 12/09/2021 CLINICAL DATA:  Atraumatic penile bleeding. EXAM: PORTABLE CHEST 1 VIEW COMPARISON:  November 27, 2021 FINDINGS: Stable, mild to moderate severity enlargement of the cardiac silhouette is seen. There is moderate severity calcification of the aortic arch. Mild, hazy atelectasis and/or infiltrate is seen within the mid left lung and bilateral lung bases. A mild amount of fluid is again noted within the horizontal fissure. No pneumothorax is identified. A radiopaque vascular stent is seen overlying the left shoulder. The visualized skeletal structures are unremarkable. IMPRESSION: 1. Stable cardiomegaly with mild mid left lung and mild bibasilar atelectasis and/or infiltrate. Electronically Signed   By: Virgina Norfolk M.D.   On: 12/09/2021 04:37    Procedures .Critical Care  Performed by: Ezequiel Essex, MD Authorized by: Ezequiel Essex, MD   Critical care provider statement:    Critical care time (minutes):  35   Critical care time was exclusive of:  Separately billable procedures and treating other patients   Critical care was necessary to treat or prevent imminent or life-threatening deterioration of the following conditions: anemia, hyponatremia.   Critical care was time spent personally by me on the following activities:  Development of treatment plan with patient or surrogate, discussions with consultants, evaluation of patient's response to treatment,  examination of patient, ordering and review of laboratory studies, ordering and review of radiographic studies, ordering and performing treatments and interventions, pulse oximetry, re-evaluation of patient's condition and review of old charts     Medications Ordered in ED Medications  0.9 %  sodium chloride infusion (has no administration in time range)    ED Course/ Medical Decision Making/ A&P                           Medical Decision Making Amount and/or Complexity of Data Reviewed Labs: ordered. Decision-making details documented in ED Course. Radiology: ordered and independent interpretation performed. Decision-making details documented in ED Course. ECG/medicine tests: ordered and independent interpretation performed. Decision-making details documented in ED Course.  Risk Prescription drug management. Decision regarding hospitalization.  Bleeding from genital area without trauma.  Vitals stable, no distress, chart review shows no history of blood thinner use Bleeding stable on assessment. Source appears to be from scrotal abrasion.  Hemoglobin 5.1 decreased from 7.5 range. Agreeable to blood transfusion. FOBT + but stool brown.  Hyponatremia at 113. Has been mid 120s in past. Not on diuretics. Due for dialysis today, may be hypervolemic in nature. No hypoxia or increased work of breathing.   Patient fairly asymptomatic from hyponatremia with no mental status changes or seizures.  Plan admission for further evaluation as well as blood transfusion.   D/w Dr. Myna Hidalgo        Final Clinical Impression(s) / ED Diagnoses Final diagnoses:  Hyponatremia  Acute on chronic anemia    Rx / DC Orders ED Discharge Orders     None         Orlo Brickle, Annie Main, MD 12/09/21 [phone]

## 2021-12-09 NOTE — Consult Note (Signed)
[facility] Consult: 11:38 AM 12/09/2021  LOS: 0 days    Referring Provider: NP Lurena Nida of hospitalist service.    Primary Care Physician:  [name], NP Primary Gastroenterologist:  unassigned,  no outpt GI encounters.       Reason for Consultation:  Anemia.  Hgb 5.     HPI: Daniel [name] is a 61 y.o. male.  Nonambulatory, wheelchair-bound.  See outlined PMH below.  Previous Plavix after March 2023 R SFA stenting, discontinued due to current GI bleeding and anemia.  ESRD.  MWF HD.  Recurrent GI bleeding with findings of duodenal ulcer, small bowel AVMs, erosive esophagitis, hyperplastic duodenal polyps on previous endo studies.  Chronic anemia (IDA, chronic disease, GI sources).  HCV, never treated, quantitative 9,880,000 in 07/2021.  Hep cellular Ca dx 07/2019, started Y90 treatment for [facility] on 7/13, again on 8/3.  Hyponatremia.    Latest GI studies include but there are several others dating to March 2021. 06/2019 colonoscopy: sigmoid diverticulosis, possible old blood in terminal ileum suggesting SB source.  Barrett's esophagus on 01/2021 EGD. 07/05/2021 SBE.  Friable gastric mucosa, normal esophagus.  A few, recently bleeding AVMs in duodenum treated with monopolar probe.  Single nonbleeding jejunal AVM treated with probe 07/05/2021 flex sig with blood in rectum, rectosigmoid and sigmoid. 08/06/21 SBE: Severe, circumferential esophagitis without bleeding.  Pill esophagitis suspected.  Friable gastric mucosa/gastritis.  Multiple friable duodenal polyps bled on contact and possible source of GI bleed.  Examined jejunum normal.  Pathology showed Candida esophagitis with erosion.  Gastric polyps were hyperplastic with focal erosion at surface, no H. pylori.  Duodenal polyps also hyperplastic, no  features of celiac disease on duodenal biopsies. 08/24/2021 SBE.  Distal extent tattooed.  Grade 1 esophageal varices.  Portal hypertensive gastropathy.  Duodenal polyps. 08/25/2021 VCE.  Duodenal polyps.  Small red spot vs diminutive AVM without blood anywhere.  Green liquid secretions and stool throughout the bowel.  Study considered nondiagnostic 09/15/2021 SBE.  3 duodenal AVMs treated with APC and felt to be likely source of bleeding.  Small esophageal varices, portal hypertensive gastropathy and solitary gastric polyp. 10/06/2021 VCE, capsule placed using endoscope.  Capsule moved back into the stomach then into the duodenum at about 6 hours and 20-minute 9 minutes then back into stomach where battery life of 12 hours expired.  Visualized previously cauterized sites of pre-pyloric/duodenal polyps, duodenal AVM w heme in region.  Dr. Carlean Purl chose not to pursue additional endoscopy.  Felt pt would need deep enteroscopy which may require tertiary care facility.  MD said "afraid he will have recurrent problems regardless" 08/05/2021 CT angio abdomen pelvis.  No active bleeding.  Outpouching near expected position of duodenal bulb or post bulbar duodenum, suspicious for focal duodenal ulcer.  Adjacent retroperitoneal fat edema.  4.4 cm mass near juncture of liver segments IVb and VIII as well as a possible adjacent 2.2 cm mass in segment VIII.  Differential includes hepatocellular carcinoma, neuroendocrine carcinoma.  Not excluded for cholangiocarcinoma and metastatic disease. 08/07/21 MRI abd, MRCP: Cirrhosis.  5 cm mass at segment 4A is LI-RADS 5, c/w hepatocellular carcinoma.  2 cm mass in the central right hepatic lobe LI-RADS 3. Consider follow-up in 3-6 months 10/09/2021: CT chest/abd/pelvis: Extensive multifocal pulmonary consolidation, infiltrate progressive compared with previous, bilateral pleural effusions.  Stable mild peripancreatic edema in pancreaticoduodenal groove, Querry mild edema/interstitial  pancreatitis.  Central hepatic masses not well delineated on noncontrast study.  Cirrhosis.  Cholelithiasis. Nonobstructing left nephrolithiasis AFP 13.9 in mid April 2023, 10.5 in early May 2023 11/14/2021 SBE. For Hgb 6.9, platelets 108, black stools:  Grade 1, nonbleeding esophageal varices, no stigmata of bleeding.  Nodular bulbar mucosa.  Tattoo seen at jejunum.  No blood, no bleeding, no obvious source of bleeding.  Dr. Loletha Carrow suspected distal small bowel is beyond the reach of enteroscope.  All offered idea of long-acting octreotide therapy, IV iron and transfusion prn.   Received a couple of PRBCs during an admission in May, June for Hgb 6.9.  Got up as high as 8.9 but afterwards rechecked at 7.8, 7.2.  Home meds include Protonix 40 mg po bid, has been compliant.  Not on platelet disrupting medications or anticoagulation.  No NSAIDs, aspirin products.   Now back in the ED for evaluation of blood seen on his underwear and on a washcloth.  Noticed this yesterday.   Still urinates but did not see any blood in his urine.  Did not see melena or hematochezia.  He endorses dyspnea.  Last dialysis session was on Wednesday which means he missed his Friday session.  Increased RLE swelling.  Nonproductive cough.  Appetite generally poor, not a new issue.  Some fatigue but no profound weakness.  No abdominal pain.  Hgb 5.1.  MCV 100.  Platelets 104.  INR 1.1.  sodium very low at 113.  Potassium 4.3.  Other than low albumin 2.5, LFTs normal. 1 PRBC thus far.    On ED providers rectal exam there was a bloodsoaked paper towel in the patient's underwear, no obvious penile bleeding.  A small wound and abrasion on the right scrotum was visualized but not actively bleeding.  No melena or blood on rectal exam.   Past Medical History:  Diagnosis Date   Anemia    Diabetes mellitus without complication [facility])    patient denies   Dialysis patient [facility])    End stage chronic kidney disease ([place])    Hypertension     ICH (intracerebral hemorrhage) ([place]) 05/20/2017   PAD (peripheral artery disease) ([facility])    Shoulder pain, left 06/28/2013    Past Surgical History:  Procedure Laterality Date   A/V FISTULAGRAM N/A 08/15/2020   Procedure: A/V FISTULAGRAM - Left Upper;  Surgeon: Cherre Robins, MD;  Location: [place] [facility];  Service: Cardiovascular;  Laterality: N/A;   ABDOMINAL AORTOGRAM W/LOWER EXTREMITY N/A 04/08/2021   Procedure: ABDOMINAL AORTOGRAM W/LOWER EXTREMITY;  Surgeon: Waynetta Sandy, MD;  Location: [place] [facility];  Service: Cardiovascular;  Laterality: N/A;   AMPUTATION Left 04/16/2021   Procedure: LEFT BELOW KNEE AMPUTATION;  Surgeon: Angelia Mould, MD;  Location: [place];  Service: Vascular;  Laterality: Left;   AMPUTATION Left 06/17/2021   Procedure: REVISION AMPUTATION BELOW KNEE;  Surgeon: Broadus John, MD;  Location: [place];  Service: Vascular;  Laterality: Left;   APPENDECTOMY     APPLICATION OF WOUND VAC Left 06/17/2021   Procedure: APPLICATION OF WOUND VAC;  Surgeon: Broadus John, MD;  Location: [place];  Service: Vascular;  Laterality:  Left;   AV FISTULA PLACEMENT Left 08/03/2019   Procedure: LEFT ARM ARTERIOVENOUS (AV) CEPHALIC  FISTULA CREATION;  Surgeon: Waynetta Sandy, MD;  Location: [place];  Service: Vascular;  Laterality: Left;   BIOPSY  06/30/2019   Procedure: BIOPSY;  Surgeon: Ronald Lobo, MD;  Location: [facility] ENDOSCOPY;  Service: Endoscopy;;   BIOPSY  08/02/2019   Procedure: BIOPSY;  Surgeon: Yetta Flock, MD;  Location: [facility] ENDOSCOPY;  Service: Gastroenterology;;   BIOPSY  02/08/2021   Procedure: BIOPSY;  Surgeon: Sharyn Creamer, MD;  Location: [facility] ENDOSCOPY;  Service: Gastroenterology;;   BIOPSY  02/24/2021   Procedure: BIOPSY;  Surgeon: [name], MD;  Location: [facility] ENDOSCOPY;  Service: Gastroenterology;;   BIOPSY  03/26/2021   Procedure: BIOPSY;  Surgeon: Lavena Bullion, DO;  Location: [place];  Service:  Gastroenterology;;   BIOPSY  08/06/2021   Procedure: BIOPSY;  Surgeon: [name], MD;  Location: [place];  Service: Gastroenterology;;   BIOPSY  09/15/2021   Procedure: BIOPSY;  Surgeon: Sharyn Creamer, MD;  Location: [place];  Service: Gastroenterology;;   COLONOSCOPY  01/23/2012   Procedure: COLONOSCOPY;  Surgeon: Danie Binder, MD;  [facility];  Service: Endoscopy;  Laterality: N/A;  11:10 AM   COLONOSCOPY WITH PROPOFOL N/A 06/30/2019   Procedure: COLONOSCOPY WITH PROPOFOL;  Surgeon: Ronald Lobo, MD;  Location: [place];  Service: Endoscopy;  Laterality: N/A;   ENTEROSCOPY N/A 08/02/2019   Procedure: ENTEROSCOPY;  Surgeon: Yetta Flock, MD;  Location: [facility] ENDOSCOPY;  Service: Gastroenterology;  Laterality: N/A;   ENTEROSCOPY N/A 02/24/2021   Procedure: ENTEROSCOPY;  Surgeon: [name], MD;  Location: [facility] ENDOSCOPY;  Service: Gastroenterology;  Laterality: N/A;   ENTEROSCOPY N/A 03/26/2021   Procedure: ENTEROSCOPY;  Surgeon: Lavena Bullion, DO;  Location: [place];  Service: Gastroenterology;  Laterality: N/A;   ENTEROSCOPY N/A 07/05/2021   Procedure: ENTEROSCOPY;  Surgeon: Carol Ada, MD;  Location: [place];  Service: Gastroenterology;  Laterality: N/A;   ENTEROSCOPY N/A 08/06/2021   Procedure: ENTEROSCOPY;  Surgeon: [name], MD;  Location: [facility] ENDOSCOPY;  Service: Gastroenterology;  Laterality: N/A;   ENTEROSCOPY N/A 08/24/2021   Procedure: ENTEROSCOPY;  Surgeon: Ladene Artist, MD;  Location: [facility] ENDOSCOPY;  Service: Gastroenterology;  Laterality: N/A;   ENTEROSCOPY N/A 09/15/2021   Procedure: ENTEROSCOPY;  Surgeon: Sharyn Creamer, MD;  Location: [facility] ENDOSCOPY;  Service: Gastroenterology;  Laterality: N/A;   ENTEROSCOPY N/A 10/06/2021   Procedure: ENTEROSCOPY;  Surgeon: [name], MD;  Location: [facility] ENDOSCOPY;  Service: Gastroenterology;  Laterality: N/A;   ENTEROSCOPY N/A 11/14/2021   Procedure:  ENTEROSCOPY;  Surgeon: Doran Stabler, MD;  Location: [facility] ENDOSCOPY;  Service: Gastroenterology;  Laterality: N/A;   ESOPHAGOGASTRODUODENOSCOPY N/A 08/10/2020   Procedure: ESOPHAGOGASTRODUODENOSCOPY (EGD);  Surgeon: Jerene Bears, MD;  Location: [facility] ENDOSCOPY;  Service: Gastroenterology;  Laterality: N/A;   ESOPHAGOGASTRODUODENOSCOPY (EGD) WITH PROPOFOL N/A 06/30/2019   Procedure: ESOPHAGOGASTRODUODENOSCOPY (EGD) WITH PROPOFOL;  Surgeon: Ronald Lobo, MD;  Location: [place];  Service: Endoscopy;  Laterality: N/A;   ESOPHAGOGASTRODUODENOSCOPY (EGD) WITH PROPOFOL N/A 01/12/2021   Procedure: ESOPHAGOGASTRODUODENOSCOPY (EGD) WITH PROPOFOL;  Surgeon: Sharyn Creamer, MD;  Location: [place];  Service: Gastroenterology;  Laterality: N/A;   ESOPHAGOGASTRODUODENOSCOPY (EGD) WITH PROPOFOL N/A 02/08/2021   Procedure: ESOPHAGOGASTRODUODENOSCOPY (EGD) WITH PROPOFOL;  Surgeon: Sharyn Creamer, MD;  Location: [place];  Service: Gastroenterology;  Laterality: N/A;   FISTULA SUPERFICIALIZATION Left 10/17/2019   Procedure: LEFT  UPPER EXTREMITY FISTULA REVISION, SIDE BRANCH LIGATION,  AND SUPERFICIALIZATION;  Surgeon: Marty Heck, MD;  Location: [place];  Service: Vascular;  Laterality: Left;   FISTULA SUPERFICIALIZATION Left 29/79/8921   Procedure: PLICATION OF ANEURYSM LEFT ARTERIOVENOUS FISTULA;  Surgeon: Angelia Mould, MD;  Location: [place];  Service: Vascular;  Laterality: Left;   FLEXIBLE SIGMOIDOSCOPY N/A 07/05/2021   Procedure: FLEXIBLE SIGMOIDOSCOPY;  Surgeon: Carol Ada, MD;  Location: [place];  Service: Gastroenterology;  Laterality: N/A;   GIVENS CAPSULE STUDY N/A 06/30/2019   Procedure: GIVENS CAPSULE STUDY;  Surgeon: Ronald Lobo, MD;  Location: [place];  Service: Endoscopy;  Laterality: N/A;   GIVENS CAPSULE STUDY N/A 08/25/2021   Procedure: GIVENS CAPSULE STUDY;  Surgeon: Ladene Artist, MD;  Location: [place];  Service: Gastroenterology;  Laterality:  N/A;   GIVENS CAPSULE STUDY N/A 10/06/2021   Procedure: GIVENS CAPSULE STUDY;  Surgeon: [name], MD;  Location: [place];  Service: Gastroenterology;  Laterality: N/A;   HEMOSTASIS CLIP PLACEMENT  08/10/2020   Procedure: HEMOSTASIS CLIP PLACEMENT;  Surgeon: Jerene Bears, MD;  Location: [place];  Service: Gastroenterology;;   HEMOSTASIS CLIP PLACEMENT  01/12/2021   Procedure: HEMOSTASIS CLIP PLACEMENT;  Surgeon: Sharyn Creamer, MD;  Location: [place];  Service: Gastroenterology;;   HEMOSTASIS CONTROL  08/02/2019   Procedure: HEMOSTASIS CONTROL;  Surgeon: Yetta Flock, MD;  Location: [place];  Service: Gastroenterology;;   HOT HEMOSTASIS  02/24/2021   Procedure: HOT HEMOSTASIS (ARGON PLASMA COAGULATION/BICAP);  Surgeon: [name], MD;  Location: [facility] ENDOSCOPY;  Service: Gastroenterology;;   HOT HEMOSTASIS N/A 03/26/2021   Procedure: HOT HEMOSTASIS (ARGON PLASMA COAGULATION/BICAP);  Surgeon: Lavena Bullion, DO;  Location: [facility] ENDOSCOPY;  Service: Gastroenterology;  Laterality: N/A;   HOT HEMOSTASIS N/A 07/05/2021   Procedure: HOT HEMOSTASIS (ARGON PLASMA COAGULATION/BICAP);  Surgeon: Carol Ada, MD;  Location: [place];  Service: Gastroenterology;  Laterality: N/A;   HOT HEMOSTASIS N/A 09/15/2021   Procedure: HOT HEMOSTASIS (ARGON PLASMA COAGULATION/BICAP);  Surgeon: Sharyn Creamer, MD;  Location: [place];  Service: Gastroenterology;  Laterality: N/A;   HOT HEMOSTASIS N/A 10/06/2021   Procedure: HOT HEMOSTASIS (ARGON PLASMA COAGULATION/BICAP);  Surgeon: [name], MD;  Location: [place];  Service: Gastroenterology;  Laterality: N/A;   INCISION AND DRAINAGE ABSCESS N/A 06/29/2016   Procedure: INCISION AND DRAINAGE ABDOMINAL WALL ABSCESS;  Surgeon: Alphonsa Overall, MD;  Location: [facility];  Service: General;  Laterality: N/A;   INSERTION OF DIALYSIS CATHETER Right 08/03/2019   Procedure: INSERTION OF DIALYSIS CATHETER;  Surgeon: Waynetta Sandy, MD;  Location: [place];  Service: Vascular;  Laterality: Right;   INSERTION OF DIALYSIS CATHETER Right 10/22/2019   Procedure: INSERTION OF 23CM TUNNELED DIALYSIS CATHETER RIGHT INTERNAL JUGULAR;  Surgeon: Angelia Mould, MD;  Location: [place];  Service: Vascular;  Laterality: Right;   INSERTION OF DIALYSIS CATHETER Right 08/12/2020   Procedure: INSERTION OF Right internal Jugular TUNNELED  DIALYSIS CATHETER.;  Surgeon: Waynetta Sandy, MD;  Location: [place];  Service: Vascular;  Laterality: Right;   INSERTION OF DIALYSIS CATHETER N/A 04/19/2021   Procedure: INSERTION OF TUNNELED DIALYSIS CATHETER;  Surgeon: Angelia Mould, MD;  Location: [place];  Service: Vascular;  Laterality: N/A;   IR 3D INDEPENDENT WKST  11/07/2021   IR ANGIOGRAM SELECTIVE EACH ADDITIONAL VESSEL  11/07/2021   IR ANGIOGRAM SELECTIVE EACH ADDITIONAL VESSEL  11/07/2021   IR ANGIOGRAM SELECTIVE EACH ADDITIONAL VESSEL  11/07/2021  IR ANGIOGRAM SELECTIVE EACH ADDITIONAL VESSEL  11/28/2021   IR ANGIOGRAM VISCERAL SELECTIVE  11/07/2021   IR ANGIOGRAM VISCERAL SELECTIVE  11/28/2021   IR EMBO ARTERIAL NOT HEMORR HEMANG INC GUIDE ROADMAPPING  11/07/2021   IR EMBO TUMOR ORGAN ISCHEMIA INFARCT INC GUIDE ROADMAPPING  11/28/2021   IR RADIOLOGIST EVAL & MGMT  09/12/2021   IR US GUIDE VASC ACCESS LEFT  11/07/2021   IR US GUIDE VASC ACCESS LEFT  11/28/2021   Left heel surgery     LOWER EXTREMITY ANGIOGRAPHY N/A 07/08/2021   Procedure: Lower Extremity Angiography;  Surgeon: Cherre Robins, MD;  Location: [place] [facility];  Service: Cardiovascular;  Laterality: N/A;   PENILE BIOPSY N/A 03/26/2020   Procedure: PENILE ULCER DEBRIDEMENT;  Surgeon: Remi Haggard, MD;  Location[facility];  Service: Urology;  Laterality: N/A;  30 MINS   PERIPHERAL VASCULAR INTERVENTION Right 07/08/2021   Procedure: PERIPHERAL VASCULAR INTERVENTION;  Surgeon: Cherre Robins, MD;  Location: [place] [facility];  Service:  Cardiovascular;  Laterality: Right;   SCLEROTHERAPY  01/12/2021   Procedure: SCLEROTHERAPY;  Surgeon: Sharyn Creamer, MD;  Location: [facility] ENDOSCOPY;  Service: Gastroenterology;;   Clide Deutscher  02/24/2021   Procedure: Clide Deutscher;  Surgeon: [name], MD;  Location: [place];  Service: Gastroenterology;;   SUBMUCOSAL TATTOO INJECTION  08/24/2021   Procedure: SUBMUCOSAL TATTOO INJECTION;  Surgeon: Ladene Artist, MD;  Location: [place];  Service: Gastroenterology;;   SUBMUCOSAL TATTOO INJECTION  09/15/2021   Procedure: SUBMUCOSAL TATTOO INJECTION;  Surgeon: Sharyn Creamer, MD;  Location: [facility] ENDOSCOPY;  Service: Gastroenterology;;    Prior to Admission medications   Medication Sig Start Date End Date Taking? Authorizing Provider  amLODipine (NORVASC) 5 MG tablet Take 1 tablet (5 mg total) by mouth daily. 09/18/21 12/17/21  [place] Donnamarie Poag, DO  atorvastatin (LIPITOR) 40 MG tablet Take 1 tablet (40 mg total) by mouth daily. 09/18/21 12/17/21  [place] Donnamarie Poag, DO  gabapentin (NEURONTIN) 300 MG capsule Take 1 capsule (300 mg total) by mouth at bedtime. 11/15/21   Nita Sells, MD  pantoprazole (PROTONIX) 40 MG tablet Take 1 tablet (40 mg total) by mouth 2 (two) times daily before a meal. 10/10/21 01/08/22  Mercy Riding, MD  traZODone (DESYREL) 50 MG tablet Take 1 tablet (50 mg total) by mouth at bedtime as needed for sleep. 11/15/21   Nita Sells, MD  colchicine 0.6 MG tablet Take 0.5 tablets (0.3 mg total) by mouth 2 (two) times daily. 07/24/20 07/29/20  Noemi Chapel, MD  furosemide (LASIX) 40 MG tablet Take 1 tablet (40 mg total) by mouth daily. 07/25/19 02/09/20  Ladona Horns, MD    Scheduled Meds:  amLODipine  5 mg Oral Daily   atorvastatin  40 mg Oral Daily   Chlorhexidine Gluconate Cloth  6 each Topical Q0600   darbepoetin (ARANESP) injection - DIALYSIS  200 mcg Intravenous Q Mon-HD   folic acid  1 mg Oral Daily   gabapentin  300 mg Oral QHS   LORazepam  0.5 mg  Intravenous Once   multivitamin with minerals  1 tablet Oral Daily   pantoprazole (PROTONIX) IV  40 mg Intravenous Q12H   sodium chloride flush  3 mL Intravenous Q12H   thiamine  100 mg Oral Daily   traZODone  50 mg Oral QHS   Infusions:  sodium chloride Stopped (12/09/21 0711)   PRN Meds: albuterol   Allergies as of 12/09/2021 - Review Complete 12/09/2021  Allergen Reaction  Noted   Seroquel [quetiapine] Other (See Comments) 04/22/2021   Dilaudid [hydromorphone hcl] Itching and Other (See Comments) 07/29/2020    Family History  Problem Relation Age of Onset   Colon cancer Neg Hx     Social History   Socioeconomic History   Marital status: Single    Spouse name: Not on file   Number of children: 2   Years of education: 12   Highest education level: 12th grade  Occupational History   Occupation: disabled  Tobacco Use   Smoking status: Every Day    Packs/day: 0.50    Years: 45.00    Total pack years: 22.50    Types: Cigarettes   Smokeless tobacco: Never   Tobacco comments:    Pt left before information given  Vaping Use   Vaping Use: Never used  Substance and Sexual Activity   Alcohol use: Not Currently   Drug use: Not Currently    Types: "Crack" cocaine    Comment: last in 2020   Sexual activity: Yes    Birth control/protection: None  Other Topics Concern   Not on file  Social History Narrative   Goes to dialysis M-W-F, LBKA using a wheelchair at present, waiting for prosthetic. Looking for handicapped housing for immediate move in.   Social Determinants of Health   Financial Resource Strain: High Risk (11/01/2021)   Overall Financial Resource Strain (CARDIA)    Difficulty of Paying Living Expenses: Hard  Food Insecurity: No Food Insecurity (11/01/2021)   Hunger Vital Sign    Worried About Running Out of Food in the Last Year: Never true    Ran Out of Food in the Last Year: Never true  Transportation Needs: No Transportation Needs (11/01/2021)   PRAPARE -  Hydrologist (Medical): No    Lack of Transportation (Non-Medical): No  Physical Activity: Insufficiently Active (11/01/2021)   Exercise Vital Sign    Days of Exercise per Week: 1 day    Minutes of Exercise per Session: 20 min  Stress: No Stress Concern Present (11/01/2021)   [place]    Feeling of Stress : Not at all  Social Connections: Socially Isolated (11/01/2021)   Social Connection and Isolation Panel [NHANES]    Frequency of Communication with Friends and Family: More than three times a week    Frequency of Social Gatherings with Friends and Family: More than three times a week    Attends Religious Services: Never    Marine scientist or Organizations: No    Attends Archivist Meetings: Never    Marital Status: Divorced  Human resources officer Violence: Not At Risk (11/01/2021)   Humiliation, Afraid, Rape, and Kick questionnaire    Fear of Current or Ex-Partner: No    Emotionally Abused: No    Physically Abused: No    Sexually Abused: No    REVIEW OF SYSTEMS: Constitutional: Weakness ENT:  No nose bleeds Pulm: No new dyspnea CV:  No palpitations, no LE edema.  GU:  No hematuria, no frequency GI: See HPI Heme: Other than seeing blood in his underwear, denies any other unusual bleeding or bruising. Transfusions: Yes on multiple occasions. Neuro:  No headaches, no peripheral tingling or numbness.  No syncope, no seizures. Derm:  No itching, no rash or sores.  Endocrine:  No sweats or chills.  No polyuria or dysuria Immunization: Reviewed. Travel:  None beyond local counties in last few months.  PHYSICAL EXAM: Vital signs in last 24 hours: Vitals:   12/09/21 0924 12/09/21 1023  BP: (!) 163/88 (!) 166/65  Pulse: 78 82  Resp: (!) 21 (!) 24  Temp:    SpO2:  100%   Wt Readings from Last 3 Encounters:  12/09/21 108.9 kg  11/28/21 108.9 kg  11/13/21 105.9 kg     General: Looks moderately chronically ill but pleasant, alert, comfortable. Head: No facial asymmetry or swelling.  No signs of head trauma. Eyes: No conjunctival pallor.  No scleral icterus. Ears: Not hard of hearing. Nose: No gesturing or discharge Mouth: Fair dentition.  Tongue midline.  Mucosa moist, pink, clear. Neck: No JVD Lungs: Clear bilaterally without labored breathing or cough. Heart: RRR.  No MRG.  S1, S2 present. Abdomen: Soft without tenderness.  No HSM, masses, hernias, bruits.  Bowel sounds active no distention..   Rectal: Deferred.  See above in HPI for description of ED providers exam Musc/Skeltl: Status post right BKA. Extremities: 2-3+ swelling on the right lower extremity/foot. Neurologic: Oriented x3.  Moves all 4 limbs without tremor.  Strength not tested. Skin: No rash or sores.  Does have right scrotal abrasion as described above under HPI. Nodes: No cervical adenopathy Psych: Cooperative, calm, pleasant.  Intake/Output from previous day: 08/13 0701 - 08/14 0700 In: 250 [IV Piggyback:250] Out: -  Intake/Output this shift: Total I/O In: 396.2 [Blood:396.2] Out: -   LAB RESULTS: Recent Labs    12/09/21 0313  WBC 4.0  HGB 5.1*  HCT 14.9*  PLT 104*   BMET Lab Results  Component Value Date   NA 114 (LL) 12/09/2021   NA 113 (LL) 12/09/2021   NA 121 (L) 12/03/2021   K 4.3 12/09/2021   K 4.6 12/09/2021   K 4.5 12/03/2021   CL 82 (L) 12/09/2021   CL 83 (L) 12/09/2021   CL 86 (L) 12/03/2021   CO2 17 (L) 12/09/2021   CO2 15 (L) 12/09/2021   CO2 15 (L) 12/03/2021   GLUCOSE 93 12/09/2021   GLUCOSE 92 12/09/2021   GLUCOSE 79 12/03/2021   BUN 46 (H) 12/09/2021   BUN 47 (H) 12/09/2021   BUN 59 (H) 12/03/2021   CREATININE 11.07 (H) 12/09/2021   CREATININE 11.12 (H) 12/09/2021   CREATININE 11.55 (H) 12/03/2021   CALCIUM 7.7 (L) 12/09/2021   CALCIUM 7.7 (L) 12/09/2021   CALCIUM 7.6 (L) 12/03/2021   LFT Recent Labs    12/09/21 0313  PROT  7.8  ALBUMIN 2.5*  AST 38  ALT 13  ALKPHOS 109  BILITOT 1.1   PT/INR Lab Results  Component Value Date   INR 1.1 12/09/2021   INR 1.0 11/28/2021   INR 1.1 11/13/2021   Hepatitis Panel No results for input(s): "HEPBSAG", "HCVAB", "HEPAIGM", "HEPBIGM" in the last 72 hours. C-Diff No components found for: "CDIFF" Lipase     Component Value Date/Time   LIPASE 49 10/09/2021 0201    Drugs of Abuse     Component Value Date/Time   LABOPIA NONE DETECTED 03/29/2019 1209   COCAINSCRNUR POSITIVE (A) 03/29/2019 1209   LABBENZ NONE DETECTED 03/29/2019 1209   AMPHETMU NONE DETECTED 03/29/2019 1209   THCU NONE DETECTED 03/29/2019 1209   LABBARB NONE DETECTED 03/29/2019 1209     RADIOLOGY STUDIES: DG Chest Portable 1 View  Result Date: 12/09/2021 CLINICAL DATA:  Atraumatic penile bleeding. EXAM: PORTABLE CHEST 1 VIEW COMPARISON:  November 27, 2021 FINDINGS: Stable, mild to moderate severity enlargement of the cardiac silhouette is  seen. There is moderate severity calcification of the aortic arch. Mild, hazy atelectasis and/or infiltrate is seen within the mid left lung and bilateral lung bases. A mild amount of fluid is again noted within the horizontal fissure. No pneumothorax is identified. A radiopaque vascular stent is seen overlying the left shoulder. The visualized skeletal structures are unremarkable. IMPRESSION: 1. Stable cardiomegaly with mild mid left lung and mild bibasilar atelectasis and/or infiltrate. Electronically Signed   By: Virgina Norfolk M.D.   On: 12/09/2021 04:37      IMPRESSION:      Recurrent blood loss anemia.  Blood in underwear and on washcloth.  Scrotal abrasion/small wound on GU exam per ED.  Melena, no blood on DRE.  Extensive endoscopic work-ups over the past 2+ years latest study was SBE on 7/20 showing nonbleeding esoph varices, nodular mucosa at duodenal bulb.  No AVMs or active bleeding.  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  Initiated on Y90 treatments 7/13.  Latest treatment was 8/3.     ESRD.  Variable compliance with hemodialysis MWF.  Missed last Friday's dialysis.  Also missed dialysis sessions leading up to admission mid July.    Marked hyponatremia.  Na 113.  Dates back at least 2 months and recentNa levels in the 120s to 130s in mid July and early August this month.  Currently manifesting obvious neurologic signs/symptoms.   PLAN:     Note renal input regarding sodium correction.  HD in progress.  .    Not clear there would be benefit to repeating EGD.  Dr. Tarri Glenn will see the patient.  In the meantime he can eat his usual renal diet (I ordered).      Additional PRBCs as needed.    Switched back from the ordered IV Protonix to Protonix 40 mg po bid.      Azucena Freed  12/09/2021, 11:38 AM Phone [phone]

## 2021-12-09 NOTE — Telephone Encounter (Signed)
-----   Message from [place] Park, MD sent at 12/09/2021  3:52 PM EDT ----- Would you please see if this patient's insurance would allow for coverage of outpatient long-acting octreotide to treat small bowel AVMs?  Thank you!  KLB

## 2021-12-09 NOTE — Consult Note (Signed)
ESRD Consult Note  Assessment/Recommendations:   ESRD:  -outpatient orders: [facility]. MWF. 4 hours. Nipro. 2K/2Cal, EDW 101kg. 450/500 flow rates. No heparin. Not on ESA/Fe or VDRA/Vit D -will c/w HD on MWF. May need an extra treatment this week but will evaluate first -Will need SW assistance to help with transportation issues to HD (hence multiple missed treatments)  Severe hyponatremia (acute on chronic), hypervolemic -UF as tolerated, utilizing low Na bath to avoid overcorrection -recommend checking Na 2 hours after dialysis and then check q4h -goal Na by tomorrow AM should be ~120-121  Hypervolemia/ hypertension: BP acceptable, resume home meds, will attempt to UF as tolerated  Acute blood loss anemia, anemia of CKD: Hemoglobin 5.1 on admit. Iron replete on 7/24, will start max dose ESA 8/14 -1U prbc in ER and another unit in HD today. Transfuse PRN -GI consulted  Secondary Hyperparathyroidism/Hyperphosphatemia: renvela on home med list at outpt HD unit, not sure if he is taking this, will check PO4 and resume if needed   Hepatocellular carcinoma w/ cirrhosis -s/p Y90 treatment w/ IR on 11/28/21 -palliative consulted, per primary  Recommendations were discussed with the primary team.  History of Present Illness: Daniel [name] is a/an 61 y.o. 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 Has also missed multiple HD treatments. Has also been experiencing nonproductive cough which has been severe. Found to be hemoccult positive, volume overloaded w/ severe hyponatremia in ER. Patient seen in HD unit. With severe cough and anxiety but otherwise tolerating HD, discussed with HD staff to increase UF goals to 4L as opposed to 1L. Has had multiple missed HD treatments due to transportation issues.   Medications:  Current Facility-Administered  Medications  Medication Dose Route Frequency Provider Last Rate Last Admin   0.9 %  sodium chloride infusion  10 mL/hr Intravenous Once Rancour, Annie Main, MD   Held at 12/09/21 0711   albuterol (PROVENTIL) (2.5 MG/3ML) 0.083% nebulizer solution 2.5 mg  2.5 mg Nebulization Q6H PRN Samella Parr, NP   2.5 mg at 12/09/21 0952   amLODipine (NORVASC) tablet 5 mg  5 mg Oral Daily Samella Parr, NP       atorvastatin (LIPITOR) tablet 40 mg  40 mg Oral Daily Samella Parr, NP       Chlorhexidine Gluconate Cloth 2 % PADS 6 each  6 each Topical Q0600 Gean Quint, MD       folic acid (FOLVITE) tablet 1 mg  1 mg Oral Daily Samella Parr, NP       gabapentin (NEURONTIN) capsule 300 mg  300 mg Oral QHS Samella Parr, NP       LORazepam (ATIVAN) injection 0.5 mg  0.5 mg Intravenous Once Samella Parr, NP       multivitamin with minerals tablet 1 tablet  1 tablet Oral Daily Samella Parr, NP       pantoprazole (PROTONIX) injection 40 mg  40 mg Intravenous Q12H Erin Hearing L, NP       sodium chloride flush (NS) 0.9 % injection 3 mL  3 mL Intravenous Q12H Samella Parr, NP       thiamine (VITAMIN B1) tablet 100 mg  100 mg Oral Daily Samella Parr, NP       traZODone (DESYREL) tablet 50 mg  50 mg Oral QHS Erin Hearing  L, NP       Current Outpatient Medications  Medication Sig Dispense Refill   amLODipine (NORVASC) 5 MG tablet Take 1 tablet (5 mg total) by mouth daily. 30 tablet 2   atorvastatin (LIPITOR) 40 MG tablet Take 1 tablet (40 mg total) by mouth daily. 30 tablet 2   gabapentin (NEURONTIN) 300 MG capsule Take 1 capsule (300 mg total) by mouth at bedtime. 90 capsule 3   pantoprazole (PROTONIX) 40 MG tablet Take 1 tablet (40 mg total) by mouth 2 (two) times daily before a meal. 60 tablet 2   traZODone (DESYREL) 50 MG tablet Take 1 tablet (50 mg total) by mouth at bedtime as needed for sleep. 90 tablet 1     ALLERGIES Seroquel [quetiapine] and Dilaudid [hydromorphone  hcl]  MEDICAL HISTORY Past Medical History:  Diagnosis Date   Anemia    Diabetes mellitus without complication ([place])    patient denies   Dialysis patient [facility])    End stage chronic kidney disease ([place])    Hypertension    ICH (intracerebral hemorrhage) ([place]) 05/20/2017   PAD (peripheral artery disease) ([facility])    Shoulder pain, left 06/28/2013     SOCIAL HISTORY Social History   Socioeconomic History   Marital status: Single    Spouse name: Not on file   Number of children: 2   Years of education: 12   Highest education level: 12th grade  Occupational History   Occupation: disabled  Tobacco Use   Smoking status: Every Day    Packs/day: 0.50    Years: 45.00    Total pack years: 22.50    Types: Cigarettes   Smokeless tobacco: Never   Tobacco comments:    Pt left before information given  Vaping Use   Vaping Use: Never used  Substance and Sexual Activity   Alcohol use: Not Currently   Drug use: Not Currently    Types: "Crack" cocaine    Comment: last in 2020   Sexual activity: Yes    Birth control/protection: None  Other Topics Concern   Not on file  Social History Narrative   Goes to dialysis M-W-F, [facility] using a wheelchair at present, waiting for prosthetic. Looking for handicapped housing for immediate move in.   Social Determinants of Health   Financial Resource Strain: High Risk (11/01/2021)   Overall Financial Resource Strain (CARDIA)    Difficulty of Paying Living Expenses: Hard  Food Insecurity: No Food Insecurity (11/01/2021)   Hunger Vital Sign    Worried About Running Out of Food in the Last Year: Never true    Ran Out of Food in the Last Year: Never true  Transportation Needs: No Transportation Needs (11/01/2021)   PRAPARE - Hydrologist (Medical): No    Lack of Transportation (Non-Medical): No  Physical Activity: Insufficiently Active (11/01/2021)   Exercise Vital Sign    Days of Exercise per Week: 1 day    Minutes of  Exercise per Session: 20 min  Stress: No Stress Concern Present (11/01/2021)   [place]    Feeling of Stress : Not at all  Social Connections: Socially Isolated (11/01/2021)   Social Connection and Isolation Panel [NHANES]    Frequency of Communication with Friends and Family: More than three times a week    Frequency of Social Gatherings with Friends and Family: More than three times a week    Attends Religious Services: Never  Active Member of Clubs or Organizations: No    Attends Archivist Meetings: Never    Marital Status: Divorced  Human resources officer Violence: Not At Risk (11/01/2021)   Humiliation, Afraid, Rape, and Kick questionnaire    Fear of Current or Ex-Partner: No    Emotionally Abused: No    Physically Abused: No    Sexually Abused: No     FAMILY HISTORY Family History  Problem Relation Age of Onset   Colon cancer Neg Hx      Review of Systems: 12 systems were reviewed and negative except per HPI  Physical Exam: Vitals:   12/09/21 0924 12/09/21 1023  BP: (!) 163/88 (!) 166/65  Pulse: 78 82  Resp: (!) 21 (!) 24  Temp:    SpO2:  100%   Total I/O In: 396.2 [Blood:396.2] Out: -   Intake/Output Summary (Last 24 hours) at 12/09/2021 1053 Last data filed at 12/09/2021 9937 Gross per 24 hour  Intake 646.17 ml  Output --  Net 646.17 ml   General: in distress 2/2 coughing HEENT: anicteric sclera, MMM CV: normal rate, no murmurs Lungs: bibasilar crackles, increased WOB w/ nonproductive cough Abd: soft, non-tender, non-distended Ext: 2+ RLE pitting edema Neuro: anxious appearing, awake Dialysis access: LUE AVF +b/t (in use)  Test Results Reviewed Lab Results  Component Value Date   NA 114 (LL) 12/09/2021   K 4.3 12/09/2021   CL 82 (L) 12/09/2021   CO2 17 (L) 12/09/2021   BUN 46 (H) 12/09/2021   CREATININE 11.07 (H) 12/09/2021   CALCIUM 7.7 (L) 12/09/2021   ALBUMIN 2.5 (L)  12/09/2021   PHOS 6.0 (H) 11/15/2021    I have reviewed relevant outside healthcare records

## 2021-12-09 NOTE — Progress Notes (Signed)
Received patient from HD with no nurse to nurse report.  No report from ED RN either.  Unsuccessfully attempted to reach HD RN.  Per patient, he received blood in ED only and no blood given in HD.  No recheck hemoglobin since blood admin. Md notified.

## 2021-12-09 NOTE — Progress Notes (Signed)
HD reported to 56M Nurse, mental health. Received one unit PRBCs in HD and 4L removed.

## 2021-12-09 NOTE — ED Triage Notes (Signed)
Pt having bleeding from penis. He does not know if its in urine or from something else. Pt unable to tell RN where blood is coming from. Denies trauma to area.

## 2021-12-10 ENCOUNTER — Ambulatory Visit: Payer: Self-pay | Admitting: *Deleted

## 2021-12-10 ENCOUNTER — Other Ambulatory Visit (HOSPITAL_COMMUNITY): Payer: Self-pay

## 2021-12-10 ENCOUNTER — Inpatient Hospital Stay (HOSPITAL_COMMUNITY): Payer: Medicare Other

## 2021-12-10 DIAGNOSIS — K5521 Angiodysplasia of colon with hemorrhage: Secondary | ICD-10-CM | POA: Diagnosis not present

## 2021-12-10 DIAGNOSIS — D500 Iron deficiency anemia secondary to blood loss (chronic): Secondary | ICD-10-CM | POA: Diagnosis not present

## 2021-12-10 DIAGNOSIS — Z7189 Other specified counseling: Secondary | ICD-10-CM | POA: Diagnosis not present

## 2021-12-10 DIAGNOSIS — E871 Hypo-osmolality and hyponatremia: Secondary | ICD-10-CM | POA: Diagnosis not present

## 2021-12-10 DIAGNOSIS — R0602 Shortness of breath: Secondary | ICD-10-CM

## 2021-12-10 DIAGNOSIS — E8779 Other fluid overload: Secondary | ICD-10-CM | POA: Diagnosis not present

## 2021-12-10 LAB — COMPREHENSIVE METABOLIC PANEL
ALT: 14 U/L (ref 0–44)
AST: 40 U/L (ref 15–41)
Albumin: 2.4 g/dL — ABNORMAL LOW (ref 3.5–5.0)
Alkaline Phosphatase: 104 U/L (ref 38–126)
Anion gap: 11 (ref 5–15)
BUN: 23 mg/dL (ref 8–23)
CO2: 26 mmol/L (ref 22–32)
Calcium: 7.7 mg/dL — ABNORMAL LOW (ref 8.9–10.3)
Chloride: 90 mmol/L — ABNORMAL LOW (ref 98–111)
Creatinine, Ser: 6.86 mg/dL — ABNORMAL HIGH (ref 0.61–1.24)
GFR, Estimated: 8 mL/min — ABNORMAL LOW (ref >60)
Glucose, Bld: 102 mg/dL — ABNORMAL HIGH (ref 70–99)
Potassium: 4 mmol/L (ref 3.5–5.1)
Sodium: 127 mmol/L — ABNORMAL LOW (ref 135–145)
Total Bilirubin: 1 mg/dL (ref 0.3–1.2)
Total Protein: 7.3 g/dL (ref 6.5–8.1)

## 2021-12-10 LAB — CBC
HCT: 18.6 % — ABNORMAL LOW (ref 39.0–52.0)
Hemoglobin: 6.7 g/dL — CL (ref 13.0–17.0)
MCH: 34.4 pg — ABNORMAL HIGH (ref 26.0–34.0)
MCHC: 36 g/dL (ref 30.0–36.0)
MCV: 95.4 fL (ref 80.0–100.0)
Platelets: 89 10*3/uL — ABNORMAL LOW (ref 150–400)
RBC: 1.95 MIL/uL — ABNORMAL LOW (ref 4.22–5.81)
RDW: 16.2 % — ABNORMAL HIGH (ref 11.5–15.5)
WBC: 3.3 10*3/uL — ABNORMAL LOW (ref 4.0–10.5)
nRBC: 0 % (ref 0.0–0.2)

## 2021-12-10 LAB — GLUCOSE, CAPILLARY
Glucose-Capillary: 86 mg/dL (ref 70–99)
Glucose-Capillary: 118 mg/dL — ABNORMAL HIGH (ref 70–99)

## 2021-12-10 LAB — SODIUM: Sodium: 125 mmol/L — ABNORMAL LOW (ref 135–145)

## 2021-12-10 LAB — PREPARE RBC (CROSSMATCH)

## 2021-12-10 LAB — APTT: aPTT: 37 seconds — ABNORMAL HIGH (ref 24–36)

## 2021-12-10 LAB — PROTIME-INR
INR: 1.1 (ref 0.8–1.2)
Prothrombin Time: 14.5 seconds (ref 11.4–15.2)

## 2021-12-10 LAB — PHOSPHORUS: Phosphorus: 4.3 mg/dL (ref 2.5–4.6)

## 2021-12-10 MED ORDER — MELATONIN 3 MG PO TABS
3.0000 mg | ORAL_TABLET | Freq: Every evening | ORAL | Status: DC | PRN
  Administered 2021-12-10: 3 mg via ORAL
  Filled 2021-12-10: qty 1

## 2021-12-10 MED ORDER — MAGNESIUM SULFATE 2 GM/50ML IV SOLN
2.0000 g | Freq: Once | INTRAVENOUS | Status: AC
  Administered 2021-12-10: 2 g via INTRAVENOUS
  Filled 2021-12-10: qty 50

## 2021-12-10 MED ORDER — SODIUM CHLORIDE 0.9% IV SOLUTION: Freq: Once | INTRAVENOUS | Status: DC

## 2021-12-10 MED ORDER — LIDOCAINE-PRILOCAINE 2.5-2.5 % EX CREA: 1.0000 | TOPICAL_CREAM | CUTANEOUS | Status: DC | PRN

## 2021-12-10 MED ORDER — LIDOCAINE HCL (PF) 1 % IJ SOLN: 5.0000 mL | INTRAMUSCULAR | Status: DC | PRN

## 2021-12-10 MED ORDER — ALBUTEROL SULFATE (2.5 MG/3ML) 0.083% IN NEBU
2.5000 mg | INHALATION_SOLUTION | RESPIRATORY_TRACT | Status: DC | PRN
Start: 2021-12-10 — End: 2021-12-11
  Administered 2021-12-10: 2.5 mg via RESPIRATORY_TRACT
  Filled 2021-12-10: qty 3

## 2021-12-10 MED ORDER — DARBEPOETIN ALFA 200 MCG/0.4ML IJ SOSY: 200.0000 ug | PREFILLED_SYRINGE | INTRAMUSCULAR | Status: DC

## 2021-12-10 MED ORDER — HYDRALAZINE HCL 20 MG/ML IJ SOLN: 10.0000 mg | INTRAMUSCULAR | Status: DC | PRN

## 2021-12-10 MED ORDER — PENTAFLUOROPROP-TETRAFLUOROETH EX AERO: 1.0000 | INHALATION_SPRAY | CUTANEOUS | Status: DC | PRN

## 2021-12-10 NOTE — Progress Notes (Signed)
Patient complaining of SOB and left upper quad abd pain. Vitals insignificant. 99% on RA Notified Dr. Earlie Counts.  Was ordered to hold on 1 u prbc until dialysis.  Spoke with PA Ejigiri, will be ordering HD today.

## 2021-12-10 NOTE — Patient Outreach (Signed)
[facility]) Care Management  12/10/2021  Daniel [name] Aug 04, 1960 [id number]  Pt scheduled for follow up phone call, however, pt was admitted yesterday with a Hgb of 5.1. He has missed several of his dialysis appts. He has not heard from [employer] on where he is on the wait list. NP called and was told his application is not on record. Advised we submitted a few weeks before his 61nd birthday and the agent said that it was probably thrown out because of that. Called and communicated need to pt sister, Daniel [name], that the application needs to be resubmitted and requested that she do this and set up an account so she can keep up with the progress of his application.  NP will call pt on Friday if he has been discharge.

## 2021-12-10 NOTE — Progress Notes (Signed)
TRH night cross cover note:  I ordered prn po Benadryl for patient's report of pruritus without associated rash.    Babs Bertin, DO Hospitalist

## 2021-12-10 NOTE — Consult Note (Signed)
Consultation Note Date: 12/10/2021   Patient Name: Daniel [name]  DOB: [birth year]  MRN: [id number]  Age / Sex: 61 y.o., male  PCP: Daniel Perna, NP Referring Physician: [name] Hazel, MD  Reason for Consultation:  "patient with known hepatocellular carcinoma as well as on dialysis, has GAVE and duodenal AVMs. Readmitted with symptomatic anemia and hemoccult positive stools. Also had missed dialysis quite volume overloaded. Needs goals of care"  HPI/Patient Profile: 61 y.o. 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  He is being transfused.  GI is consulted.  They are reviewing possible outpatient octreotide treatment.  He has chronic hyponatremia.  Palliative consulted for above.  Primary Decision Maker PATIENT  Discussion: Chart reviewed including labs, progress notes from this and current admissions as well as care everywhere, imaging. Per chart review [name] is followed closely outpatient by [place] NP- Daniel [name].  She has been attempting to assist him with getting housing, and multiple people have been assisting with transportation.  He has been referred to social services for transportation assistance.  He is also requested to change dialysis centers so that his center will be closer to his home. Evaluated patient-he was awake, alert, and oriented.  He appeared to be in some distress with shortness of breath, he was using his accessory muscles there was stridor and wheezing.  O2 sats were within normal limits.  I attempted to get him to sit up in the bed and he refused.  Noted it was recommended for him to have dialysis this morning as an extra session but he refused.  I asked him  why he refused and he said " because I am supposed to get it tomorrow"-I discussed with him that he may need extra session due to missing dialysis last week therefore being more volume overloaded.  He is agreeable to going to dialysis now that he is symptomatic with short of breath. I had a brief advance care planning discussion with him-if he is unable to make decisions for himself he would want his brother Daniel [name] to be his Media planner. If his breathing were to get worse he is agreeable to being on artificial life support for a short amount of time but not prolonged.  If his heart were to stop and he stopped breathing he would want CPR.  He continues to desire full aggressive medical care with the goal of prolonging his life.  SUMMARY OF RECOMMENDATIONS -Full code -Full scope care -Will attempt follow-up conversation at a later time when he is not in distress -He was referred to outpatient palliative in a previous admission and he declined their services when they called to schedule an appointment with him -He has several siblings and if he wishes for his 1 brother to be his decision maker he would benefit from completing a healthcare power  of attorney-I will review this and advanced directives with him at a later date   Code Status/Advance Care Planning: Full code   Prognosis:   Unable to determine  Discharge Planning: To Be Determined  Primary Diagnoses: Present on Admission:  Hyponatremia  Symptomatic anemia   Review of Systems  Respiratory:  Positive for cough, shortness of breath, wheezing and stridor.     Physical Exam Vitals and nursing note reviewed.  Constitutional:      Appearance: He is ill-appearing.  Pulmonary:     Comments: Increased effort, using accessory muscles Abdominal:     General: There is distension.  Neurological:     Mental Status: He is alert.     Vital Signs: BP (!) 162/78 (BP Location: Right Arm)   Pulse 87   Temp 98.8 F (37.1 C)  (Oral)   Resp 16   Ht '5\' 11"'$  (1.803 m)   Wt 108.9 kg   SpO2 94%   BMI 33.48 kg/m  Pain Scale: 0-10   Pain Score: 0-No pain   SpO2: SpO2: 94 % O2 Device:SpO2: 94 % O2 Flow Rate: .O2 Flow Rate (L/min): 3 L/min  IO: Intake/output summary:  Intake/Output Summary (Last 24 hours) at 12/10/2021 1049 Last data filed at 12/10/2021 0800 Gross per 24 hour  Intake 580.85 ml  Output 200 ml  Net 380.85 ml    LBM: Last BM Date : 12/09/21 Baseline Weight: Weight: 108.9 kg Most recent weight: Weight: 108.9 kg       Thank you for this consult. Palliative medicine will continue to follow and assist as needed.  Time Total: 90 minutes Greater than 50%  of this time was spent counseling and coordinating care related to the above assessment and plan.  Signed by: Mariana Kaufman, AGNP-C Palliative Medicine    Please contact Palliative Medicine Team phone at [phone] for questions and concerns.  For individual provider: See [name]

## 2021-12-10 NOTE — Hospital Course (Signed)
61yo with hx PAD s/p L BK, ICH, ESRD on MWF HD, recurrent UGI bleed from GAVE and duodenal AVMs presented to ED initially with concerns of BRBPR. While in ED, pt was found to be grossly vol overloaded with hyponatremia of 113 and hgb of 5.1. Stools were heme pos. Nephrology and GI consulted

## 2021-12-10 NOTE — Progress Notes (Addendum)
[facility] Progress Note   Subjective:  Completed dialysis yesterday 4L removed. Seen in room. Alert, responds to questions appropriately. No complaints other than foot pain. Reports he had some bleeding from his foot. He declines extra dialysis today.   Objective Vitals:   12/09/21 1823 12/09/21 2057 12/10/21 0533 12/10/21 0827  BP: (!) 155/79 (!) 165/67 (!) 146/79 (!) 136/91  Pulse: 90 88 89 85  Resp: '17 18 18 16  '$ Temp: 98.5 F (36.9 C) 98.7 F (37.1 C) 98.7 F (37.1 C) 98.2 F (36.8 C)  TempSrc:   Oral Oral  SpO2: 98% 96% 100% 94%  Weight:      Height:          Additional Objective Labs: Basic Metabolic Panel: Recent Labs  Lab 12/09/21 0313 12/09/21 0415 12/09/21 1816 12/10/21 0502  NA 113* 114* 129*  129* 127*  K 4.6 4.3  --  4.0  CL 83* 82*  --  90*  CO2 15* 17*  --  26  GLUCOSE 92 93  --  102*  BUN 47* 46*  --  23  CREATININE 11.12* 11.07*  --  6.86*  CALCIUM 7.7* 7.7*  --  7.7*  PHOS  --   --  3.7 4.3   CBC: Recent Labs  Lab 12/03/21 2134 12/09/21 0313 12/09/21 1816 12/10/21 0502  WBC 4.5 4.0  --  3.3*  NEUTROABS 3.4 3.1  --   --   HGB 6.6* 5.1* 10.3* 6.7*  HCT 19.1* 14.9* 31.1* 18.6*  MCV 97.0 100.0  --  95.4  PLT 110* 104*  --  89*   Blood Culture    Component Value Date/Time   SDES BLOOD BLOOD RIGHT HAND 08/16/2021 2049   SPECREQUEST  08/16/2021 2049    BOTTLES DRAWN AEROBIC AND ANAEROBIC Blood Culture adequate volume   CULT  08/16/2021 2049    NO GROWTH 5 DAYS Performed at [facility], [place] [address], [place], [place] [address]    REPTSTATUS 08/21/2021 FINAL 08/16/2021 2049     Physical Exam General: Alert, no distress  Heart: RRR  Lungs: Faint crackles,  occasional wheeze Abdomen: soft non-tender Extremities: L BKA, No sig LE edema Dialysis Access: LUE AVF +bruit   Medications:  magnesium sulfate bolus IVPB 2 g (12/10/21 0904)    sodium chloride   Intravenous Once   amLODipine  5 mg Oral Daily    atorvastatin  40 mg Oral Daily   Chlorhexidine Gluconate Cloth  6 each Topical Q0600   darbepoetin (ARANESP) injection - DIALYSIS  200 mcg Intravenous Q Mon-HD   folic acid  1 mg Oral Daily   gabapentin  300 mg Oral QHS   LORazepam  0.5 mg Intravenous Once   multivitamin with minerals  1 tablet Oral Daily   pantoprazole  40 mg Oral BID   sodium chloride flush  3 mL Intravenous Q12H   thiamine  100 mg Oral Daily   traZODone  50 mg Oral QHS    ESRD:  -outpatient orders: [facility]. MWF. 4 hours. Nipro. 2K/2Cal, EDW 101kg. 450/500 flow rates. No heparin. Not on ESA/Fe or VDRA/Vit D -will c/w HD on MWF. He declines extra dialysis today.  -Will need SW assistance to help with transportation issues to HD (hence multiple missed treatments)   Severe hyponatremia (acute on chronic), hypervolemic -UF as tolerated, utilizing low Na bath to avoid overcorrection -recommend checking Na 2 hours after dialysis and then check q4h -Na 127 this am.  Hypervolemia/ hypertension: BP acceptable, resume home meds, will attempt to UF as tolerated   Acute blood loss anemia, anemia of CKD: Hemoglobin 5.1 on admit. Iron replete on 7/24, will start max dose ESA. Did not get ESA Monday -will order for 8/16.  -s/p 2U prbc  on 8/14. Hgb 6.7. Orders for transfusion today.  -GI consulted   Secondary Hyperparathyroidism/Hyperphosphatemia: renvela on home med list at outpt HD unit, not sure if he is taking this. Phos at goal.    Hepatocellular carcinoma w/ cirrhosis -s/p Y90 treatment w/ IR on 11/28/21 -palliative consulted, per primary   Lynnda Child PA-C [place] 12/10/2021,9:25 AM

## 2021-12-10 NOTE — Progress Notes (Signed)
Received patient in bed to unit. Alert and oriented. Informed consent signed and in chart.   Treatment initiated: [place] Treatment completed: 1700  Patient treatment and blood transfusion tolerated well. Transported back to the room alert, without acute distress. Report given to patient's floor nurse.   Access used: LUA [place] Access issues: none  Total UF removed: 3.4L Medication(s) given: none Post HD VS: 166/74 85 97% on 2L 98.1 14  Post HD weight: 107.6kg    [facility]

## 2021-12-10 NOTE — Progress Notes (Signed)
TRH night cross cover note:   I was notified by RN of the patient's request for a sleep aid. I subsequently placed order for prn melatonin for insomnia.     Ladeana Laplant, DO Hospitalist  

## 2021-12-10 NOTE — Progress Notes (Signed)
Daily Rounding Note  12/10/2021, 11:41 AM  LOS: 1 day   SUBJECTIVE:   Chief complaint:   FOBT + anemia.  recurrent     Staff has not reported seeing blood.  Pt says he saw some blood w BM yesterday.  Belly feels bloatedSignif reps distress today, had been better after HD yesterday.  Plan repeat HD today.    OBJECTIVE:         Vital signs in last 24 hours:    Temp:  [98.2 F (36.8 C)-98.9 F (37.2 C)] 98.8 F (37.1 C) (08/15 1029) Pulse Rate:  [80-90] 87 (08/15 1029) Resp:  [16-20] 16 (08/15 0827) BP: (127-170)/(52-152) 162/78 (08/15 1029) SpO2:  [94 %-100 %] 94 % (08/15 0827) Last BM Date : 12/09/21 Filed Weights   12/09/21 0118  Weight: 108.9 kg   General: obese, ill, some resp distress.  Nebs/mask in place   Heart: RRR Chest: wheezes bil.   Abdomen: soft, NT.  BS hypoactive.   Extremities: + edema Neuro/Psych:  appropriate.  A bit agitated.     Intake/Output from previous day: 08/14 0701 - 08/15 0700 In: 637 [P.O.:237; I.V.:3.9; Blood:396.2] Out: 200 [Urine:200]  Intake/Output this shift: Total I/O In: 700 [P.O.:700] Out: -   Lab Results: Recent Labs    12/09/21 0313 12/09/21 1816 12/10/21 0502  WBC 4.0  --  3.3*  HGB 5.1* 10.3* 6.7*  HCT 14.9* 31.1* 18.6*  PLT 104*  --  89*   BMET Recent Labs    12/09/21 0313 12/09/21 0415 12/09/21 1816 12/10/21 0502 12/10/21 0924  NA 113* 114* 129*  129* 127* 125*  K 4.6 4.3  --  4.0  --   CL 83* 82*  --  90*  --   CO2 15* 17*  --  26  --   GLUCOSE 92 93  --  102*  --   BUN 47* 46*  --  23  --   CREATININE 11.12* 11.07*  --  6.86*  --   CALCIUM 7.7* 7.7*  --  7.7*  --    LFT Recent Labs    12/09/21 0313 12/09/21 1816 12/10/21 0502  PROT 7.8 7.6 7.3  ALBUMIN 2.5* 2.4* 2.4*  AST 38 40 40  ALT '13 14 14  '$ ALKPHOS 109 104 104  BILITOT 1.1 1.3* 1.0  BILIDIR  --  0.6*  --   IBILI  --  0.7  --    PT/INR Recent Labs    12/09/21 0313  12/10/21 0502  LABPROT 14.0 14.5  INR 1.1 1.1   Hepatitis Panel Recent Labs    12/09/21 1816  HEPBSAG NON REACTIVE  HCVAB Reactive*    Studies/Results: DG CHEST PORT 1 VIEW  Result Date: 12/10/2021 CLINICAL DATA:  dialysis patient with shortness of breath. EXAM: PORTABLE CHEST 1 VIEW COMPARISON:  12/09/2021 FINDINGS: Stable cardiomediastinal contours. Aortic atherosclerotic calcifications. Unchanged appearance of hazy opacity within the left midlung and bilateral lung bases. No atelectasis. No definite pleural effusion identified. IMPRESSION: No change in left mid lung and bilateral lung base hazy opacities which may reflect areas of asymmetric edema versus infection. Cardiac enlargement. Aortic Atherosclerosis (ICD10-I70.0). Electronically Signed   By: Kerby Moors M.D.   On: 12/10/2021 10:59   DG Chest Portable 1 View  Result Date: 12/09/2021 CLINICAL DATA:  Atraumatic penile bleeding. EXAM: PORTABLE CHEST 1 VIEW COMPARISON:  November 27, 2021 FINDINGS: Stable, mild to moderate severity enlargement of the cardiac silhouette  is seen. There is moderate severity calcification of the aortic arch. Mild, hazy atelectasis and/or infiltrate is seen within the mid left lung and bilateral lung bases. A mild amount of fluid is again noted within the horizontal fissure. No pneumothorax is identified. A radiopaque vascular stent is seen overlying the left shoulder. The visualized skeletal structures are unremarkable. IMPRESSION: 1. Stable cardiomegaly with mild mid left lung and mild bibasilar atelectasis and/or infiltrate. Electronically Signed   By: Virgina Norfolk M.D.   On: 12/09/2021 04:37    ASSESMENT:     Recurrent anemia.  Longstanding history of GI bleed and associated blood loss anemia.  Stents of endoscopic work-ups including VCE in recent months.  Suspect distal small bowel AVMs beyond reach of conventional enteroscope.  No plans to repeat endoscopies.  Hgb 5.1.[name] 1 PRBC... 6.7.[name] 1 PRBC.    Protonix 40 po bid in place.      HCV, never treated.  HBV quant pndg.    H CCA diagnosed 07/2021.  Y90 treatment 7/13, 8/3.  ESRD.  Hyponatremia.  114... 125.     PLAN   Can the pharmacy investigate options for monthly depot octreotide?   Referral to [facility] for deep enteroscopy??     Azucena Freed  12/10/2021, 11:41 AM Phone [phone]

## 2021-12-10 NOTE — Progress Notes (Signed)
  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 While in ED, pt was found to be grossly vol overloaded with hyponatremia of 113 and hgb of 5.1. Stools were heme pos. 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 Stools pos for blood -GI consulted. Concerns for recurrent GI blood loss due to suspected distal SB AVMs -consideration for Sandostatin LAR, otherwise referral to [facility] for double balloon enteroscopy -would f/u with GI recs -Hgb down to 6.7 from 10.3. 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 but has not followed up postprocedure stating has not been given an appointment -Palliative consultation placed -Has chronic thrombocytopenia but LFTs are normal- -Current MELD score is 29   Hypertension -On Norvasc  -BP suboptimally controlled. Will add PRN hydralazine   History of cerebral hemorrhage 2019 -Seems to be neurologically stable   Dyslipidemia Continue Lipitor since LFTs are normal   PAD with history of left BKA/prior right SFA stent March 2023 Patient wheelchair-bound and lives alone   Chronic insomnia Continue gabapentin and trazodone  Sob -Audible wheezing  noted -Pt reports long hx of tobacco abuse -Will cont on PRN albuterol nebs       Subjective: Feeling sob this AM, coughing and wheezing  Physical Exam: Vitals:   12/10/21 1600 12/10/21 1630 12/10/21 1700 12/10/21 1710  BP: (!) 167/109 (!) 157/114 (!) 157/79   Pulse: 84 85 86   Resp: '13 12 13   '$ Temp:      TempSrc:      SpO2: 100% 100% 100%   Weight:    107.6 kg  Height:       General exam: Awake, laying in bed, in nad Respiratory system: increased resp effort, decreased BS Cardiovascular system: regular rate, s1, s2 Gastrointestinal system: Soft, nondistended, positive BS Central nervous system: CN2-12 grossly intact, strength intact Extremities: Perfused, no clubbing Skin: Normal skin turgor, no notable skin lesions seen Psychiatry: Mood normal // no visual hallucinations   Data Reviewed:  Labs reviewed: Na 125, K 4.0, Hgb 6.7   Family Communication: Pt in room, family not at bedside  Disposition: Status is: Inpatient Remains inpatient appropriate because: Severity of illness  Planned Discharge Destination: Home    Author: Marylu Lund, MD 12/10/2021 6:07 PM  For on call review [email or web address].

## 2021-12-11 ENCOUNTER — Telehealth: Payer: Self-pay

## 2021-12-11 ENCOUNTER — Other Ambulatory Visit (HOSPITAL_COMMUNITY): Payer: Self-pay

## 2021-12-11 DIAGNOSIS — D649 Anemia, unspecified: Secondary | ICD-10-CM | POA: Diagnosis not present

## 2021-12-11 DIAGNOSIS — D500 Iron deficiency anemia secondary to blood loss (chronic): Secondary | ICD-10-CM | POA: Diagnosis not present

## 2021-12-11 DIAGNOSIS — E871 Hypo-osmolality and hyponatremia: Secondary | ICD-10-CM | POA: Diagnosis not present

## 2021-12-11 DIAGNOSIS — I10 Essential (primary) hypertension: Secondary | ICD-10-CM

## 2021-12-11 DIAGNOSIS — I739 Peripheral vascular disease, unspecified: Secondary | ICD-10-CM

## 2021-12-11 DIAGNOSIS — N186 End stage renal disease: Secondary | ICD-10-CM | POA: Diagnosis not present

## 2021-12-11 DIAGNOSIS — Z992 Dependence on renal dialysis: Secondary | ICD-10-CM

## 2021-12-11 DIAGNOSIS — E785 Hyperlipidemia, unspecified: Secondary | ICD-10-CM

## 2021-12-11 LAB — RENAL FUNCTION PANEL
Albumin: 2.3 g/dL — ABNORMAL LOW (ref 3.5–5.0)
Anion gap: 10 (ref 5–15)
BUN: 20 mg/dL (ref 8–23)
CO2: 27 mmol/L (ref 22–32)
Calcium: 7.8 mg/dL — ABNORMAL LOW (ref 8.9–10.3)
Chloride: 92 mmol/L — ABNORMAL LOW (ref 98–111)
Creatinine, Ser: 6.11 mg/dL — ABNORMAL HIGH (ref 0.61–1.24)
GFR, Estimated: 10 mL/min — ABNORMAL LOW (ref >60)
Glucose, Bld: 143 mg/dL — ABNORMAL HIGH (ref 70–99)
Phosphorus: 3.1 mg/dL (ref 2.5–4.6)
Potassium: 3.5 mmol/L (ref 3.5–5.1)
Sodium: 129 mmol/L — ABNORMAL LOW (ref 135–145)

## 2021-12-11 LAB — COMPREHENSIVE METABOLIC PANEL
ALT: 13 U/L (ref 0–44)
AST: 40 U/L (ref 15–41)
Albumin: 2.3 g/dL — ABNORMAL LOW (ref 3.5–5.0)
Alkaline Phosphatase: 105 U/L (ref 38–126)
Anion gap: 11 (ref 5–15)
BUN: 19 mg/dL (ref 8–23)
CO2: 26 mmol/L (ref 22–32)
Calcium: 7.8 mg/dL — ABNORMAL LOW (ref 8.9–10.3)
Chloride: 92 mmol/L — ABNORMAL LOW (ref 98–111)
Creatinine, Ser: 6.11 mg/dL — ABNORMAL HIGH (ref 0.61–1.24)
GFR, Estimated: 10 mL/min — ABNORMAL LOW (ref >60)
Glucose, Bld: 115 mg/dL — ABNORMAL HIGH (ref 70–99)
Potassium: 3.6 mmol/L (ref 3.5–5.1)
Sodium: 129 mmol/L — ABNORMAL LOW (ref 135–145)
Total Bilirubin: 1.2 mg/dL (ref 0.3–1.2)
Total Protein: 7.2 g/dL (ref 6.5–8.1)

## 2021-12-11 LAB — CBC
HCT: 21.6 % — ABNORMAL LOW (ref 39.0–52.0)
HCT: 21.9 % — ABNORMAL LOW (ref 39.0–52.0)
Hemoglobin: 7.3 g/dL — ABNORMAL LOW (ref 13.0–17.0)
Hemoglobin: 7.1 g/dL — ABNORMAL LOW (ref 13.0–17.0)
MCH: 32 pg (ref 26.0–34.0)
MCH: 31.3 pg (ref 26.0–34.0)
MCHC: 33.8 g/dL (ref 30.0–36.0)
MCHC: 32.4 g/dL (ref 30.0–36.0)
MCV: 94.7 fL (ref 80.0–100.0)
MCV: 96.5 fL (ref 80.0–100.0)
Platelets: 88 10*3/uL — ABNORMAL LOW (ref 150–400)
Platelets: 86 10*3/uL — ABNORMAL LOW (ref 150–400)
RBC: 2.28 MIL/uL — ABNORMAL LOW (ref 4.22–5.81)
RBC: 2.27 MIL/uL — ABNORMAL LOW (ref 4.22–5.81)
RDW: 20.4 % — ABNORMAL HIGH (ref 11.5–15.5)
RDW: 20.5 % — ABNORMAL HIGH (ref 11.5–15.5)
WBC: 3.4 10*3/uL — ABNORMAL LOW (ref 4.0–10.5)
WBC: 3.2 10*3/uL — ABNORMAL LOW (ref 4.0–10.5)
nRBC: 0 % (ref 0.0–0.2)
nRBC: 0 % (ref 0.0–0.2)

## 2021-12-11 LAB — GLUCOSE, CAPILLARY
Glucose-Capillary: 104 mg/dL — ABNORMAL HIGH (ref 70–99)
Glucose-Capillary: 125 mg/dL — ABNORMAL HIGH (ref 70–99)

## 2021-12-11 LAB — HEPATITIS B SURFACE ANTIBODY, QUANTITATIVE: Hep B S AB Quant (Post): 26 m[IU]/mL (ref >9.9)

## 2021-12-11 MED ORDER — THIAMINE HCL 100 MG PO TABS: 100.0000 mg | ORAL_TABLET | Freq: Every day | ORAL | Status: DC

## 2021-12-11 MED ORDER — GABAPENTIN 300 MG PO CAPS
300.0000 mg | ORAL_CAPSULE | Freq: Once | ORAL | Status: AC
  Administered 2021-12-11: 300 mg via ORAL
  Filled 2021-12-11: qty 1

## 2021-12-11 MED ORDER — ADULT MULTIVITAMIN W/MINERALS CH: 1.0000 | ORAL_TABLET | Freq: Every day | ORAL | Status: DC

## 2021-12-11 MED ORDER — FOLIC ACID 1 MG PO TABS: 1.0000 mg | ORAL_TABLET | Freq: Every day | ORAL | Status: DC

## 2021-12-11 NOTE — Telephone Encounter (Signed)
Referral faxed to [facility] for double balloon enteroscopy.

## 2021-12-11 NOTE — Progress Notes (Signed)
Pt receives out-pt HD at [facility] on MWF. Pt arrives at 11:00 for 11:15 chair time. On 8/2, pt was in [facility] ED requesting assistance with transportation. Clinic social worker completed [employer] [place] application which was submitted for review. Navigator contacted clinic social worker to inquire if there has been a determination regarding pt's application. Clinic social worker states that she is not aware of any determination and that pt informed clinic social worker last week that he is not interested in transportation through [employer] or [employer]. Clinic social worker states that pt is interested in changing clinics- [facility] or [place]. Clinic social worker states that has been investigated but unfortunately neither clinic is able to accept pt at this time.   Melven Sartorius Renal Navigator [phone]

## 2021-12-11 NOTE — TOC Benefit Eligibility Note (Signed)
Patient Research scientist (life sciences) completed.     The patient is currently admitted and upon discharge could be taking SANDOSTATIN LAR DEPOT '20MG'$ .   PA is required and has been submitted. MMC:[id number]  The patient is insured through [employer]

## 2021-12-11 NOTE — Progress Notes (Signed)
Received patient in bed to unit.  Alert and oriented.  Informed consent signed and in  chart.   Treatment initiated: 0805 Treatment completed: 1238  Patient tolerated well.  Transported back to the room  alert, without acute distress.  Hand-off given to patient's nurse.   Access used: fistula left arm Access issues: none  Total UF removed: 4 liters Medication(s) given: none Post HD VS: 146/90 MAP 109 HR 78 RR 16 Sat 100% on 2 liters nasal cannula Post HD weight: 104.4 kg bed wt   [facility]

## 2021-12-11 NOTE — Progress Notes (Addendum)
Daily Rounding Note  12/11/2021, 4:15 PM  LOS: 2 days   SUBJECTIVE:   Chief complaint:  FOBT + anemia, recurrent/chronic.      Stool brown today as well as yesterday.  Feeling well.  Wants to go home.  Finished up hemodialysis today.  OBJECTIVE:         Vital signs in last 24 hours:    Temp:  [98.2 F (36.8 C)-99.2 F (37.3 C)] 98.2 F (36.8 C) (08/16 1338) Pulse Rate:  [73-92] 80 (08/16 1338) Resp:  [10-21] 19 (08/16 1338) BP: (128-177)/(69-114) 161/78 (08/16 1338) SpO2:  [96 %-100 %] 98 % (08/16 1338) Weight:  [107.6 kg-109.6 kg] 109.6 kg (08/15 2018) Last BM Date : 12/09/21 Filed Weights   12/10/21 1353 12/10/21 1710 12/10/21 2018  Weight: 111.5 kg 107.6 kg 109.6 kg   General: Obese, not acutely ill looking. Heart: RRR. Chest: No labored breathing or cough.  Faint bibasilar crackles. Abdomen: Obese, distended.  Active bowel sounds.  Not tender. Extremities: LBKA.   No significant right-sided edema. Neuro/Psych: Cooperative.  No confusion.  Moves all 4 limbs without tremor or gross deficits.  Intake/Output from previous day: 08/15 0701 - 08/16 0700 In: 2475 [P.O.:2060; I.V.:50; Blood:365] Out: 3400   Intake/Output this shift: Total I/O In: 300 [P.O.:300] Out: 4000 [Other:4000]  Lab Results: Recent Labs    12/10/21 0502 12/11/21 0610 12/11/21 0857  WBC 3.3* 3.4* 3.2*  HGB 6.7* 7.3* 7.1*  HCT 18.6* 21.6* 21.9*  PLT 89* 88* 86*   BMET Recent Labs    12/10/21 0502 12/10/21 0924 12/11/21 0610 12/11/21 0857  NA 127* 125* 129* 129*  K 4.0  --  3.6 3.5  CL 90*  --  92* 92*  CO2 26  --  26 27  GLUCOSE 102*  --  115* 143*  BUN 23  --  19 20  CREATININE 6.86*  --  6.11* 6.11*  CALCIUM 7.7*  --  7.8* 7.8*   LFT Recent Labs    12/09/21 1816 12/10/21 0502 12/11/21 0610 12/11/21 0857  PROT 7.6 7.3 7.2  --   ALBUMIN 2.4* 2.4* 2.3* 2.3*  AST 40 40 40  --   ALT '14 14 13  '$ --   ALKPHOS 104 104  105  --   BILITOT 1.3* 1.0 1.2  --   BILIDIR 0.6*  --   --   --   IBILI 0.7  --   --   --    PT/INR Recent Labs    12/09/21 0313 12/10/21 0502  LABPROT 14.0 14.5  INR 1.1 1.1   Hepatitis Panel Recent Labs    12/09/21 1816  HEPBSAG NON REACTIVE  HCVAB Reactive*    Studies/Results: DG CHEST PORT 1 VIEW  Result Date: 12/10/2021 CLINICAL DATA:  dialysis patient with shortness of breath. EXAM: PORTABLE CHEST 1 VIEW COMPARISON:  12/09/2021 FINDINGS: Stable cardiomediastinal contours. Aortic atherosclerotic calcifications. Unchanged appearance of hazy opacity within the left midlung and bilateral lung bases. No atelectasis. No definite pleural effusion identified. IMPRESSION: No change in left mid lung and bilateral lung base hazy opacities which may reflect areas of asymmetric edema versus infection. Cardiac enlargement. Aortic Atherosclerosis (ICD10-I70.0). Electronically Signed   By: Kerby Moors M.D.   On: 12/10/2021 10:59    ASSESMENT:     Recurrent anemia.  Longstanding hx GI bleed and associated blood loss anemia.  extensive endoscopic work-ups including VCE in recent months.  Suspect distal small  bowel AVMs beyond reach of conventional enteroscope.  No plans to repeat endoscopies.  Hgb 5.1.[name] 1 PRBC... 6.7.[name] 1 PRBC.. 7.1.   Protonix 40 po bid in place.  on ESA     HCV, never treated.  HBV quant pndg.     P[place] diagnosed 07/2021.  Y90 treatment 7/13, 8/3.   ESRD.   Hyponatremia.  114... 129.    Thrombocytopenia.  Platelets 86 K.      PLAN    Unfortunately the depot octreotide was declined by pt's insurance provider.  Dr Tarri Glenn office staff are working on getting this approved but it may be a while before a decision is made.    [place] office staff also working on getting outpt referral to [facility] for outpt dbl baloon enteroscopy.      GI signing off.  Will contact pt w any news from [place] or if Octreotide approved.    From Gi view ok to go home but renal and Dr Grandville Silos [facility]  hopitalist will be final arbiters of when he discharges.      Has upcoming appt w oncology, Dr Burr Medico on 8/17.  Pt was not aware of this.  Its at 9 AM tmrw.       Daniel [name]  12/11/2021, 4:15 PM Phone [phone]

## 2021-12-11 NOTE — Telephone Encounter (Signed)
-----   Message from Thornton Park, MD sent at 12/11/2021 12:40 PM EDT ----- Regarding: RE: Referral to [facility] for double balloon enteroscopy That's fine - I just wanted to be sure that Ulice Dash was aware of what was going on!  KLB ----- Message ----- From: Algernon Huxley, RN Sent: 12/11/2021  11:47 AM EDT To: Thornton Park, MD Subject: RE: Referral to [facility] for double balloon ente#  Do I put you as the ordering MD for the referral Dr. Tarri Glenn?  ----- Message ----- From: Thornton Park, MD Sent: 12/11/2021  11:34 AM EDT To: Algernon Huxley, RN Subject: Referral to [facility] for double balloon enterosc#  Vaughan Basta,  This is a patient who are practice has seen many times over the last couple of years for recurrent GI blood loss anemia not explained by multiple endoscopic procedures or imaging studies. He has never been seen in the office. Would you please help me make an outpatient referral to [facility] for double balloon enteroscopy?   Ulice Dash, If he ends up coming back to our practice, he would be your patient, so I'm including you in the loop.  KLB

## 2021-12-11 NOTE — Progress Notes (Signed)
[facility] Progress Note   Subjective:  patient seen and examined on HD. Tolerated HD yesterday with net UF 3.4L w/ 1u PRBC. Tolerating today's treatment with UFG of 4L. VSS.  Objective Vitals:   12/11/21 0815 12/11/21 0827 12/11/21 0900 12/11/21 1000  BP: (!) 157/69 128/89 (!) 177/112   Pulse: 75 82 76 73  Resp: '14 19 12 11  '$ Temp: 99.2 F (37.3 C)     TempSrc: Oral     SpO2: 100% 100% 100% 100%  Weight:      Height:          Additional Objective Labs: Basic Metabolic Panel: Recent Labs  Lab 12/09/21 1816 12/10/21 0502 12/10/21 0924 12/11/21 0610 12/11/21 0857  NA 129*  129* 127* 125* 129* 129*  K  --  4.0  --  3.6 3.5  CL  --  90*  --  92* 92*  CO2  --  26  --  26 27  GLUCOSE  --  102*  --  115* 143*  BUN  --  23  --  19 20  CREATININE  --  6.86*  --  6.11* 6.11*  CALCIUM  --  7.7*  --  7.8* 7.8*  PHOS 3.7 4.3  --   --  3.1   CBC: Recent Labs  Lab 12/09/21 0313 12/09/21 1816 12/10/21 0502 12/11/21 0610  WBC 4.0  --  3.3* 3.4*  NEUTROABS 3.1  --   --   --   HGB 5.1* 10.3* 6.7* 7.3*  HCT 14.9* 31.1* 18.6* 21.6*  MCV 100.0  --  95.4 94.7  PLT 104*  --  89* 88*   Blood Culture    Component Value Date/Time   SDES BLOOD BLOOD RIGHT HAND 08/16/2021 2049   SPECREQUEST  08/16/2021 2049    BOTTLES DRAWN AEROBIC AND ANAEROBIC Blood Culture adequate volume   CULT  08/16/2021 2049    NO GROWTH 5 DAYS Performed at [facility], [facility] [address] [place], [place] [address]    REPTSTATUS 08/21/2021 FINAL 08/16/2021 2049     Physical Exam General: Alert, no distress  Heart: RRR  Lungs: Faint bibasilar crackles, WOB improving Abdomen: soft non-tender Extremities: L BKA, No sig LE edema Dialysis Access: LUE AVF +bruit   Medications:    sodium chloride   Intravenous Once   amLODipine  5 mg Oral Daily   atorvastatin  40 mg Oral Daily   Chlorhexidine Gluconate Cloth  6 each Topical Q0600   darbepoetin (ARANESP) injection - DIALYSIS   200 mcg Intravenous Q Wed-HD   folic acid  1 mg Oral Daily   gabapentin  300 mg Oral QHS   LORazepam  0.5 mg Intravenous Once   multivitamin with minerals  1 tablet Oral Daily   pantoprazole  40 mg Oral BID   sodium chloride flush  3 mL Intravenous Q12H   thiamine  100 mg Oral Daily   traZODone  50 mg Oral QHS    ESRD:  -outpatient orders: [facility]. MWF. 4 hours. Nipro. 2K/2Cal, EDW 101kg. 450/500 flow rates. No heparin. Not on ESA/Fe or VDRA/Vit D -will c/w HD on MWF. He declines extra dialysis today.  -Will need SW assistance to help with transportation issues to HD (hence multiple missed treatments)   Severe hyponatremia (acute on chronic), hypervolemic -UF as tolerated, utilized low Na bath to avoid overcorrection but still overcorrected. Unable to utilize hypotonic fluids given needs for serial dialysis. Asymptomatic -Na 129 this am.  Hypervolemia/ hypertension: BP acceptable, resume home meds, will attempt to UF as tolerated   Acute blood loss anemia, anemia of CKD: Hemoglobin 5.1 on admit. Iron replete on 7/24, will start max dose ESA. Did not get ESA Monday -ordered for 8/16.  -s/p 2U prbc  on 8/14. 1u 8/15. Hgb 7.3.  -GI consulted   Secondary Hyperparathyroidism/Hyperphosphatemia: renvela on home med list at outpt HD unit, not sure if he is taking this. Phos at goal.    Hepatocellular carcinoma w/ cirrhosis -s/p Y90 treatment w/ IR on 11/28/21 -palliative consulted, per primary  Gean Quint, MD [facility]

## 2021-12-11 NOTE — Consult Note (Signed)
   [facility] CM Inpatient Consult   12/11/2021  Daniel [name] [birth year] [id number]  [place] [facility] [ACO] Patient: Medicare ACO REACH  Primary Care Provider:  [name], NP,    Patient is currently active with [facility] for chronic disease management services.  Patient has been engaged by a [facility] NP-G noted.  Our community based plan of care has focused on disease management and community resource support.    10:00 am Patient currently in HD, reviewed for [facility] Readmission Report, patient with extreme high risk for unplanned readmission score.  Plan: Continue to follow for disposition and follow up for additional needs with [facility] NP-G, update as needed.  Of note, [facility] Care Management services does not replace or interfere with any services that are needed or arranged by inpatient [facility] care management team.  For additional questions or referrals please contact:  Natividad Brood, RN BSN [facility] Liaison  [phone] business mobile phone Toll free office [phone]  Fax number: [phone] [email or web address]

## 2021-12-11 NOTE — Discharge Summary (Signed)
Physician Discharge Summary  Daniel [name] BCW:[id number] DOB: 03-01-1961 DOA: 12/09/2021  PCP: [name], NP  Admit date: 12/09/2021 Discharge date: 12/11/2021  Time spent: 55 minutes  Recommendations for Outpatient Follow-up:  Follow-up with [name], hematology/oncology on 12/12/2021 as scheduled. Follow-up at regular HD center on 12/13/2021 Patient will be informed from [place] on referal to [facility] and outpatient octreotide outcome with insurance. Follow up with [name], NP in 2 weeks.  On follow-up patient need a CBC done to follow-up on H&H.  Patient also need a renal panel.   Discharge Diagnoses:  Principal Problem:   Hyponatremia Active Problems:   Anemia due to GI blood loss   Acute on chronic anemia   ESRD on dialysis [facility])   Hypertension   Dyslipidemia   Discharge Condition: Stable  Diet recommendation: Renal diet.  Filed Weights   12/10/21 1353 12/10/21 1710 12/10/21 2018  Weight: 111.5 kg 107.6 kg 109.6 kg    History of present illness:  HPI per Dr. Rickey Daniel [name] is a 61 y.o. 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  Patient noted to be inconsistent historian.  Prior to presenting to the ER overnight he noticed bright red blood on the washcloth and in his underwear but was unsure where it came from.  Denied any blood in his stool or blood in urine.  When further questioned he denied any dark stool as well.  Uncertain as to how long this has been going on but based on history obtained from the ER physician it appears less than 24 hours.  He also reports that he was quite short of breath and states his last hemodialysis session was last  Wednesday.  He has noticed increased swelling in his right lower extremity noting he has a prior left BKA.  He has had a nonproductive cough for several weeks.  He states his current short of breath is different than the type of shortness of breath that he has related to missed dialysis.  He is not reporting any orthopnea.  He is nonambulatory and wheelchair-bound and is not reporting any dyspnea on exertion while rolling around in his wheelchair.   In the ER he was found to be Hemoccult positive.  Platelets low at 110,000, initial hemoglobin was 5.1.  EDP has ordered 1 unit of PRBCs.  He appears quite volume overloaded noting significant edema and right lower extremity.  EP has notified the nephrology team who plans on dialyzing the patient as soon as possible.  Pressure is elevated in the 140-160 range.  He has ranged from requiring nasal cannula oxygen to help with his dyspnea to having room air sats of 99% but with increased work of breathing.  He has improved on his left scapula that he reports is from scratching.  He denies any falls or other trauma.  Also reports issues with chronic insomnia and inability to eat secondary to anorexia.  Hospital Course:  Symptomatic anemia/history of GAVE and duodenal AVMs -Presented with hgb 5.1, s/p 2 units PRBC's. Stools pos for blood -GI consulted. Concerns for recurrent GI blood loss due to suspected distal SB AVMs -GI recommending consideration for Sandostatin LAR, otherwise referral to  [facility] for double balloon enteroscopy. -Patient noted to have hemoglobin stabilizing at 7.1 and is following up with hematology in the outpatient setting on 12/12/2021. -Patient followed by GI, loose wanting to be discharged home, GI stated the apple octreotide declined by patient's insurance provider, GI office working on getting this approved but may be a while before decision is made and will inform patient once decision is made. -GI also working on getting outpatient referral  to [facility] for outpatient double-balloon enteroscopy. -From GIs point of view patient stable for discharge and recommending continued outpatient follow-up with oncology [name] on 12/12/2021 as previously scheduled. -Patient will be informed on referral to [facility] as well as outcome of octreotide. -Patient was discharged in stable condition.   Volume overload secondary to missed hemodialysis/CKD 5 on dialysis MWF -Hx missing HD sessions in past -Nephrology assessed and followed the patient during the hospitalization. -Outpatient follow-up with regular hemodialysis center on Friday, 12/13/2021 as scheduled.   Acute hyponatremia -Sodium noted at 113 on presentation and corrected with hemodialysis and improved by day of discharge sodium was 129.   -Outpatient follow-up with primary nephrologist/HD center.   Known hepatocellular carcinoma with cirrhosis/thrombocytopenia -Status post Y90 treatment in IR on 8/3 -He has been evaluated by [name] but has not followed up postprocedure stating has not been given an appointment -Palliative consultation placed -Has chronic thrombocytopenia but LFTs are normal- -Current MELD score is 29 -Outpatient follow-up with [name], oncology as scheduled 12/12/2021.   Hypertension -Patient maintained on home regimen Norvasc.   -As needed hydralazine added.   -Outpatient follow-up with PCP.   History of cerebral hemorrhage 2019 -Remained stable.   Dyslipidemia Patient maintained on home regimen statin.    PAD with history of left BKA/prior right SFA stent March 2023 Patient wheelchair-bound and lives alone -Outpatient follow-up.   Chronic insomnia Patient maintained on home regimen gabapentin and trazodone   Sob -Audible wheezing noted early on in the hospitalization which improved with hemodialysis and as needed albuterol nebs. -Outpatient follow-up.    Procedures: Transfusion 2 units packed red blood cells 12/10/2021 Chest x-ray  12/10/2021   Consultations: Gastroenterology: Dr. Tarri Glenn 12/09/2021 Nephrology: Dr. Candiss Norse 12/09/2021  Discharge Exam: Vitals:   12/11/21 1338 12/11/21 1701  BP: (!) 161/78 (!) 140/91  Pulse: 80 89  Resp: 19 18  Temp: 98.2 F (36.8 C) 98.5 F (36.9 C)  SpO2: 98% 100%    General: NAD Cardiovascular: Regular rate rhythm no murmurs rubs or gallops.  No JVD.  No lower extremity edema.  Status post left BKA. Respiratory: CTA B anterior lung fields..  No wheezes, no crackles, no rhonchi.  Discharge Instructions   Discharge Instructions     Diet - low sodium heart healthy   Complete by: As directed    Increase activity slowly   Complete by: As directed       Allergies as of 12/11/2021       Reactions   Seroquel [quetiapine] Other (See Comments)   Tardive kinesia Dystonia    Dilaudid [hydromorphone Hcl] Itching, Other (See Comments)   Pt reports itchiness after IM injection         Medication List     TAKE these medications    amLODipine 5 MG tablet Commonly known as: NORVASC Take 1 tablet (5 mg total) by mouth daily.   atorvastatin 40 MG tablet Commonly known as: LIPITOR Take 1 tablet (40 mg total) by mouth daily.   folic acid 1 MG tablet Commonly known  as: FOLVITE Take 1 tablet (1 mg total) by mouth daily. Start taking on: December 12, 2021   gabapentin 300 MG capsule Commonly known as: NEURONTIN Take 1 capsule (300 mg total) by mouth at bedtime.   multivitamin with minerals Tabs tablet Take 1 tablet by mouth daily. Start taking on: December 12, 2021   pantoprazole 40 MG tablet Commonly known as: PROTONIX Take 1 tablet (40 mg total) by mouth 2 (two) times daily before a meal.   thiamine 100 MG tablet Commonly known as: VITAMIN B1 Take 1 tablet (100 mg total) by mouth daily. Start taking on: December 12, 2021   traZODone 50 MG tablet Commonly known as: DESYREL Take 1 tablet (50 mg total) by mouth at bedtime as needed for sleep.       Allergies   Allergen Reactions   Seroquel [Quetiapine] Other (See Comments)    Tardive kinesia Dystonia    Dilaudid [Hydromorphone Hcl] Itching and Other (See Comments)    Pt reports itchiness after IM injection     Follow-up Information     [name], NP. Schedule an appointment as soon as possible for a visit in 2 week(s).   Specialty: Internal Medicine Contact information: [place] [address] [phone]         Lorretta Harp, MD .   Specialties: Cardiology, Radiology Contact information: [address] [place] [address] [phone]         Truitt Merle, MD Follow up on 12/12/2021.   Specialties: Hematology, Oncology Why: Follow-up as scheduled at 8:30 AM. Contact information: [place] [address] [phone]         Hemodialysis center Follow up on 12/13/2021.                   The results of significant diagnostics from this hospitalization (including imaging, microbiology, ancillary and laboratory) are listed below for reference.    Significant Diagnostic Studies: DG CHEST PORT 1 VIEW  Result Date: 12/10/2021 CLINICAL DATA:  dialysis patient with shortness of breath. EXAM: PORTABLE CHEST 1 VIEW COMPARISON:  12/09/2021 FINDINGS: Stable cardiomediastinal contours. Aortic atherosclerotic calcifications. Unchanged appearance of hazy opacity within the left midlung and bilateral lung bases. No atelectasis. No definite pleural effusion identified. IMPRESSION: No change in left mid lung and bilateral lung base hazy opacities which may reflect areas of asymmetric edema versus infection. Cardiac enlargement. Aortic Atherosclerosis (ICD10-I70.0). Electronically Signed   By: Kerby Moors M.D.   On: 12/10/2021 10:59   DG Chest Portable 1 View  Result Date: 12/09/2021 CLINICAL DATA:  Atraumatic penile bleeding. EXAM: PORTABLE CHEST 1 VIEW COMPARISON:  November 27, 2021 FINDINGS: Stable, mild to  moderate severity enlargement of the cardiac silhouette is seen. There is moderate severity calcification of the aortic arch. Mild, hazy atelectasis and/or infiltrate is seen within the mid left lung and bilateral lung bases. A mild amount of fluid is again noted within the horizontal fissure. No pneumothorax is identified. A radiopaque vascular stent is seen overlying the left shoulder. The visualized skeletal structures are unremarkable. IMPRESSION: 1. Stable cardiomegaly with mild mid left lung and mild bibasilar atelectasis and/or infiltrate. Electronically Signed   By: Virgina Norfolk M.D.   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 EXAM: IR ULTRASOUND GUIDANCE VASC ACCESS LEFT;  ADDITIONAL ARTERIOGRAPHY; IR EMBO TUMOR ORGAN ISCHEMIA INFARCT INC GUIDE ROADMAPPING; SELECTIVE VISCERAL ARTERIOGRAPHY MEDICATIONS: 2 g cefoxitin, intravenous. The antibiotic was administered within 1 hour of the procedure Additional Medications: 20 mg IV Decadron, 40 mg IV Protonix, 4 mg IV Zofran, 25 mg Benadryl Y-90 dose: 30.58 mCi ANESTHESIA/SEDATION: Moderate (conscious) sedation was employed during this procedure. A total of Versed 4 mg and Fentanyl 100 mcg was administered intravenously. Moderate Sedation Time: 58 minutes. The patient's level of consciousness and vital signs were monitored continuously by radiology nursing throughout the procedure under my direct supervision. CONTRAST:  51m OMNIPAQUE IOHEXOL 300 MG/ML SOLN, 114mOMNIPAQUE IOHEXOL 300 MG/ML SOLN, 178mMNIPAQUE IOHEXOL 300 MG/ML SOLN FLUOROSCOPY TIME:  Nine hundred twenty-four mGy COMPLICATIONS: None immediate. PROCEDURE: Informed consent was obtained from the patient following explanation of the procedure, risks, benefits and alternatives. The patient  understands, agrees and consents for the procedure. All questions were addressed. A time out was performed prior to the initiation of the procedure. Maximal barrier sterile technique utilized including caps, mask, sterile gowns, sterile gloves, large sterile drape, hand hygiene, and Betadine prep. A preliminary ultrasound of the left wrist was performed and demonstrates a patent left radial artery. Barbeau A. A permanent ultrasound image was recorded. Using a combination of fluoroscopy and ultrasound, an access site was determined. The overlying skin was anesthetized with 1% Lidocaine. A small skin nick was made and gentle subcutaneous blunt dissection was performed. Using ultrasound guidance, access into the left radial artery was obtained with visualization of needle entry into the vessel using standard micropuncture technique. A 4/5 French slender sheath was placed. An MG1 catheter was advanced into the celiac artery. Celiac angiography demonstrates conventional anatomy. A Progreat Omega microcatheter and fathom 16 microwire were used to selectively catheterize the right superior hepatic artery. Selective angiography demonstrates the known mass in the superior right hepatic lobe, which is the same mass visualized on a recent planning angiogram. These findings are consistent with a focus of hepatocellular carcinoma. There is no evidence of non-target extra-hepatic branches. Working with the authorized user Dr. SteSuzy Bouchardhe previously prescribed Y90 dose was injected into the arterioles supplying the mass. The catheters were withdrawn and stowed by the radiation safety team. All wires and catheters were removed. Hemostasis was achieved using TR band. There were no immediate complications. Peripheral pulses were unchanged. The patient was transferred to the recovery area in stable condition. IMPRESSION: Technically successful right superior hepatic Y-90 hemi-lobar/radiation segmentectomy. DylRuthann CancerD  [facility] Electronically Signed   By: DylRuthann CancerD.   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 EXAM: IR ULTRASOUND GUIDANCE VASC ACCESS LEFT; ADDITIONAL ARTERIOGRAPHY; IR EMBO TUMOR ORGAN ISCHEMIA INFARCT INC GUIDE ROADMAPPING; SELECTIVE VISCERAL ARTERIOGRAPHY MEDICATIONS: 2 g cefoxitin, intravenous. The antibiotic was administered within 1 hour of the procedure Additional Medications: 20 mg IV Decadron, 40 mg IV Protonix, 4 mg IV Zofran, 25 mg Benadryl Y-90 dose: 30.58 mCi ANESTHESIA/SEDATION: Moderate (conscious) sedation was employed during this procedure. A total of Versed 4 mg and Fentanyl 100 mcg was administered intravenously. Moderate Sedation Time: 58 minutes. The patient's level of consciousness and vital signs were monitored continuously by radiology nursing throughout the procedure under my direct supervision. CONTRAST:  26m28mNIPAQUE IOHEXOL 300 MG/ML SOLN, 26mL41mIPAQUE IOHEXOL 300 MG/ML SOLN, 17mL 22mPAQUE  IOHEXOL 300 MG/ML SOLN FLUOROSCOPY TIME:  Nine hundred twenty-four mGy COMPLICATIONS: None immediate. PROCEDURE: Informed consent was obtained from the patient following explanation of the procedure, risks, benefits and alternatives. The patient understands, agrees and consents for the procedure. All questions were addressed. A time out was performed prior to the initiation of the procedure. Maximal barrier sterile technique utilized including caps, mask, sterile gowns, sterile gloves, large sterile drape, hand hygiene, and Betadine prep. A preliminary ultrasound of the left wrist was performed and demonstrates a patent left radial artery. Barbeau A. A permanent ultrasound image was recorded. Using a combination of fluoroscopy and ultrasound, an access site  was determined. The overlying skin was anesthetized with 1% Lidocaine. A small skin nick was made and gentle subcutaneous blunt dissection was performed. Using ultrasound guidance, access into the left radial artery was obtained with visualization of needle entry into the vessel using standard micropuncture technique. A 4/5 French slender sheath was placed. An MG1 catheter was advanced into the celiac artery. Celiac angiography demonstrates conventional anatomy. A Progreat Omega microcatheter and fathom 16 microwire were used to selectively catheterize the right superior hepatic artery. Selective angiography demonstrates the known mass in the superior right hepatic lobe, which is the same mass visualized on a recent planning angiogram. These findings are consistent with a focus of hepatocellular carcinoma. There is no evidence of non-target extra-hepatic branches. Working with the authorized user Dr. Suzy Bouchard, the previously prescribed Y90 dose was injected into the arterioles supplying the mass. The catheters were withdrawn and stowed by the radiation safety team. All wires and catheters were removed. Hemostasis was achieved using TR band. There were no immediate complications. Peripheral pulses were unchanged. The patient was transferred to the recovery area in stable condition. IMPRESSION: Technically successful right superior hepatic Y-90 hemi-lobar/radiation segmentectomy. Ruthann Cancer, MD [facility] Special[facility] Radiology Electronically Signed   By: Ruthann Cancer M.D.   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 EXAM: IR ULTRASOUND GUIDANCE VASC ACCESS LEFT; ADDITIONAL ARTERIOGRAPHY; IR EMBO TUMOR ORGAN ISCHEMIA INFARCT INC GUIDE ROADMAPPING; SELECTIVE VISCERAL ARTERIOGRAPHY MEDICATIONS: 2 g  cefoxitin, intravenous. The antibiotic was administered within 1 hour of the procedure Additional Medications: 20 mg IV Decadron, 40 mg IV Protonix, 4 mg IV Zofran, 25 mg Benadryl Y-90 dose: 30.58 mCi ANESTHESIA/SEDATION: Moderate (conscious) sedation was employed during this procedure. A total of Versed 4 mg and Fentanyl 100 mcg was administered intravenously. Moderate Sedation Time: 58 minutes. The patient's level of consciousness and vital signs were monitored continuously by radiology nursing throughout the procedure under my direct supervision. CONTRAST:  61m OMNIPAQUE IOHEXOL 300 MG/ML SOLN, 129mOMNIPAQUE IOHEXOL 300 MG/ML SOLN, 1768mMNIPAQUE IOHEXOL 300 MG/ML SOLN FLUOROSCOPY TIME:  Nine hundred twenty-four mGy COMPLICATIONS: None immediate. PROCEDURE: Informed consent was obtained from the patient following explanation of the procedure, risks, benefits and alternatives. The patient understands, agrees and consents for the procedure. All questions were addressed. A time out was performed prior to the initiation of the procedure. Maximal barrier sterile technique utilized including caps, mask, sterile gowns, sterile gloves, large sterile drape, hand hygiene, and Betadine prep. A preliminary ultrasound of the left wrist was performed and demonstrates a patent left radial artery. Barbeau A. A permanent ultrasound image was recorded. Using a combination of fluoroscopy and ultrasound, an access site was determined. The overlying skin was  anesthetized with 1% Lidocaine. A small skin nick was made and gentle subcutaneous blunt dissection was performed. Using ultrasound guidance, access into the left radial artery was obtained with visualization of needle entry into the vessel using standard micropuncture technique. A 4/5 French slender sheath was placed. An MG1 catheter was advanced into the celiac artery. Celiac angiography demonstrates conventional anatomy. A Progreat Omega microcatheter and fathom 16 microwire  were used to selectively catheterize the right superior hepatic artery. Selective angiography demonstrates the known mass in the superior right hepatic lobe, which is the same mass visualized on a recent planning angiogram. These findings are consistent with a focus of hepatocellular carcinoma. There is no evidence of non-target extra-hepatic branches. Working with the authorized user Dr. Suzy Bouchard, the previously prescribed Y90 dose was injected into the arterioles supplying the mass. The catheters were withdrawn and stowed by the radiation safety team. All wires and catheters were removed. Hemostasis was achieved using TR band. There were no immediate complications. Peripheral pulses were unchanged. The patient was transferred to the recovery area in stable condition. IMPRESSION: Technically successful right superior hepatic Y-90 hemi-lobar/radiation segmentectomy. Ruthann Cancer, MD [facility] Special[facility] Radiology Electronically Signed   By: Ruthann Cancer M.D.   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 EXAM: IR ULTRASOUND GUIDANCE VASC ACCESS LEFT; ADDITIONAL ARTERIOGRAPHY; IR EMBO TUMOR ORGAN ISCHEMIA INFARCT INC GUIDE ROADMAPPING; SELECTIVE VISCERAL ARTERIOGRAPHY MEDICATIONS: 2 g cefoxitin, intravenous. The antibiotic was administered within 1 hour of the procedure Additional Medications: 20 mg IV Decadron, 40 mg IV Protonix, 4 mg IV Zofran, 25 mg Benadryl Y-90 dose: 30.58 mCi ANESTHESIA/SEDATION: Moderate (conscious) sedation was employed during this procedure. A total of Versed 4 mg and Fentanyl 100 mcg was administered intravenously. Moderate Sedation Time: 58 minutes. The patient's level of consciousness and vital signs were monitored continuously by radiology nursing  throughout the procedure under my direct supervision. CONTRAST:  80m OMNIPAQUE IOHEXOL 300 MG/ML SOLN, 142mOMNIPAQUE IOHEXOL 300 MG/ML SOLN, 177mMNIPAQUE IOHEXOL 300 MG/ML SOLN FLUOROSCOPY TIME:  Nine hundred twenty-four mGy COMPLICATIONS: None immediate. PROCEDURE: Informed consent was obtained from the patient following explanation of the procedure, risks, benefits and alternatives. The patient understands, agrees and consents for the procedure. All questions were addressed. A time out was performed prior to the initiation of the procedure. Maximal barrier sterile technique utilized including caps, mask, sterile gowns, sterile gloves, large sterile drape, hand hygiene, and Betadine prep. A preliminary ultrasound of the left wrist was performed and demonstrates a patent left radial artery. Barbeau A. A permanent ultrasound image was recorded. Using a combination of fluoroscopy and ultrasound, an access site was determined. The overlying skin was anesthetized with 1% Lidocaine. A small skin nick was made and gentle subcutaneous blunt dissection was performed. Using ultrasound guidance, access into the left radial artery was obtained with visualization of needle entry into the vessel using standard micropuncture technique. A 4/5 French slender sheath was placed. An MG1 catheter was advanced into the celiac artery. Celiac angiography demonstrates conventional anatomy. A Progreat Omega microcatheter and fathom 16 microwire were used to selectively catheterize the right superior hepatic artery. Selective angiography demonstrates the known mass in the superior right hepatic lobe, which is the same mass visualized on a recent planning angiogram. These findings are consistent with a focus of hepatocellular carcinoma. There is  no evidence of non-target extra-hepatic branches. Working with the authorized user Dr. Suzy Bouchard, the previously prescribed Y90 dose was injected into the arterioles supplying the mass. The  catheters were withdrawn and stowed by the radiation safety team. All wires and catheters were removed. Hemostasis was achieved using TR band. There were no immediate complications. Peripheral pulses were unchanged. The patient was transferred to the recovery area in stable condition. IMPRESSION: Technically successful right superior hepatic Y-90 hemi-lobar/radiation segmentectomy. Ruthann Cancer, MD [facility] Radiology Electronically Signed   By: Ruthann Cancer M.D.   On: 11/28/2021 16:00   NM LIVER TUMOR LOC IMFLAM SPECT 1 DAY  Result Date: 11/28/2021 CLINICAL DATA:  Multifocal hepatocellular carcinoma. Two lesions isolated to segment 8 of the RIGHT hepatic lobe. Radiation segmentectomy with yttrium 90 microspheres EXAM: NUCLEAR MEDICINE SPECIAL MED RAD PHYSICS CONS; NUCLEAR MEDICINE RADIO PHARM THERAPY INTRA ARTERIAL; NUCLEAR MEDICINE TREATMENT PROCEDURE; NUCLEAR MEDICINE LIVER SCAN TECHNIQUE: In conjunction with the interventional radiologist a Y- Microsphere dose was calculated utilizing body surface area formulation. 3D volumetric post processing calculations. Radiation segmentectomy dose calculated. Calculated dose equal 28.6 mCi. Pre therapy MAA liver SPECT scan and CTA were evaluated. Utilizing a microcatheter system, the hepatic artery was selected and Y-90 microspheres were delivered in fractionated aliquots. Radiopharmaceutical was delivered by the interventional radiologist and nuclear radiologist. The patient tolerated procedure well. No adverse effects were noted. Bremsstrahlung planar and SPECT imaging of the abdomen following intrahepatic arterial delivery of Y-90 microsphere was performed. RADIOPHARMACEUTICALS:  53.61 millicuries yttrium 90 microspheres COMPARISON:  In a mapping scan 11/07/2021, angiography 11/07/2021, CT abdomen 10/09/2021, MRI abdomen 08/07/2021 FINDINGS: Y - 90 microspheres therapy as above. First therapy the right hepatic lobe.  Radiation segmentectomy in the RIGHT hepatic lobe (segment VIII) Bremsstrahlung planar and SPECT imaging of the abdomen following intrahepatic arterial delivery of Y-39mcrosphere demonstrates radioactivity localized to the RIGHT hepatic lobe. No evidence of extrahepatic activity. IMPRESSION: Successful Y - 90 microsphere delivery for treatment of unresectable liver metastasis. First therapy to the RIGHT lobe. Radiation segmentectomy in the RIGHT hepatic lobe (segment VIII) Bremssstrahlung scan demonstrates activity localized to RIGHT hepatic lobe with no extrahepatic activity identified. Electronically Signed   By: SSuzy BouchardM.D.   On: 11/28/2021 15:18   NM Radio Pharm Therapy Intraarterial  Result Date: 11/28/2021 CLINICAL DATA:  Multifocal hepatocellular carcinoma. Two lesions isolated to segment 8 of the RIGHT hepatic lobe. Radiation segmentectomy with yttrium 90 microspheres EXAM: NUCLEAR MEDICINE SPECIAL MED RAD PHYSICS CONS; NUCLEAR MEDICINE RADIO PHARM THERAPY INTRA ARTERIAL; NUCLEAR MEDICINE TREATMENT PROCEDURE; NUCLEAR MEDICINE LIVER SCAN TECHNIQUE: In conjunction with the interventional radiologist a Y- Microsphere dose was calculated utilizing body surface area formulation. 3D volumetric post processing calculations. Radiation segmentectomy dose calculated. Calculated dose equal 28.6 mCi. Pre therapy MAA liver SPECT scan and CTA were evaluated. Utilizing a microcatheter system, the hepatic artery was selected and Y-90 microspheres were delivered in fractionated aliquots. Radiopharmaceutical was delivered by the interventional radiologist and nuclear radiologist. The patient tolerated procedure well. No adverse effects were noted. Bremsstrahlung planar and SPECT imaging of the abdomen following intrahepatic arterial delivery of Y-90 microsphere was performed. RADIOPHARMACEUTICALS:  344.31millicuries yttrium 90 microspheres COMPARISON:  In a mapping scan 11/07/2021, angiography 11/07/2021, CT  abdomen 10/09/2021, MRI abdomen 08/07/2021 FINDINGS: Y - 90 microspheres therapy as above. First therapy the right hepatic lobe. Radiation segmentectomy in the RIGHT hepatic lobe (segment VIII) Bremsstrahlung planar and SPECT imaging of the abdomen following intrahepatic arterial delivery  of Y-49mcrosphere demonstrates radioactivity localized to the RIGHT hepatic lobe. No evidence of extrahepatic activity. IMPRESSION: Successful Y - 90 microsphere delivery for treatment of unresectable liver metastasis. First therapy to the RIGHT lobe. Radiation segmentectomy in the RIGHT hepatic lobe (segment VIII) Bremssstrahlung scan demonstrates activity localized to RIGHT hepatic lobe with no extrahepatic activity identified. Electronically Signed   By: SSuzy BouchardM.D.   On: 11/28/2021 15:18   NM Special Med Rad Physics Cons  Result Date: 11/28/2021 CLINICAL DATA:  Multifocal hepatocellular carcinoma. Two lesions isolated to segment 8 of the RIGHT hepatic lobe. Radiation segmentectomy with yttrium 90 microspheres EXAM: NUCLEAR MEDICINE SPECIAL MED RAD PHYSICS CONS; NUCLEAR MEDICINE RADIO PHARM THERAPY INTRA ARTERIAL; NUCLEAR MEDICINE TREATMENT PROCEDURE; NUCLEAR MEDICINE LIVER SCAN TECHNIQUE: In conjunction with the interventional radiologist a Y- Microsphere dose was calculated utilizing body surface area formulation. 3D volumetric post processing calculations. Radiation segmentectomy dose calculated. Calculated dose equal 28.6 mCi. Pre therapy MAA liver SPECT scan and CTA were evaluated. Utilizing a microcatheter system, the hepatic artery was selected and Y-90 microspheres were delivered in fractionated aliquots. Radiopharmaceutical was delivered by the interventional radiologist and nuclear radiologist. The patient tolerated procedure well. No adverse effects were noted. Bremsstrahlung planar and SPECT imaging of the abdomen following intrahepatic arterial delivery of Y-90 microsphere was performed.  RADIOPHARMACEUTICALS:  319.50millicuries yttrium 90 microspheres COMPARISON:  In a mapping scan 11/07/2021, angiography 11/07/2021, CT abdomen 10/09/2021, MRI abdomen 08/07/2021 FINDINGS: Y - 90 microspheres therapy as above. First therapy the right hepatic lobe. Radiation segmentectomy in the RIGHT hepatic lobe (segment VIII) Bremsstrahlung planar and SPECT imaging of the abdomen following intrahepatic arterial delivery of Y-953mrosphere demonstrates radioactivity localized to the RIGHT hepatic lobe. No evidence of extrahepatic activity. IMPRESSION: Successful Y - 90 microsphere delivery for treatment of unresectable liver metastasis. First therapy to the RIGHT lobe. Radiation segmentectomy in the RIGHT hepatic lobe (segment VIII) Bremssstrahlung scan demonstrates activity localized to RIGHT hepatic lobe with no extrahepatic activity identified. Electronically Signed   By: StSuzy Bouchard.D.   On: 11/28/2021 15:18   NM Special Treatment Procedure  Result Date: 11/28/2021 CLINICAL DATA:  Multifocal hepatocellular carcinoma. Two lesions isolated to segment 8 of the RIGHT hepatic lobe. Radiation segmentectomy with yttrium 90 microspheres EXAM: NUCLEAR MEDICINE SPECIAL MED RAD PHYSICS CONS; NUCLEAR MEDICINE RADIO PHARM THERAPY INTRA ARTERIAL; NUCLEAR MEDICINE TREATMENT PROCEDURE; NUCLEAR MEDICINE LIVER SCAN TECHNIQUE: In conjunction with the interventional radiologist a Y- Microsphere dose was calculated utilizing body surface area formulation. 3D volumetric post processing calculations. Radiation segmentectomy dose calculated. Calculated dose equal 28.6 mCi. Pre therapy MAA liver SPECT scan and CTA were evaluated. Utilizing a microcatheter system, the hepatic artery was selected and Y-90 microspheres were delivered in fractionated aliquots. Radiopharmaceutical was delivered by the interventional radiologist and nuclear radiologist. The patient tolerated procedure well. No adverse effects were noted.  Bremsstrahlung planar and SPECT imaging of the abdomen following intrahepatic arterial delivery of Y-90 microsphere was performed. RADIOPHARMACEUTICALS:  3093.26illicuries yttrium 90 microspheres COMPARISON:  In a mapping scan 11/07/2021, angiography 11/07/2021, CT abdomen 10/09/2021, MRI abdomen 08/07/2021 FINDINGS: Y - 90 microspheres therapy as above. First therapy the right hepatic lobe. Radiation segmentectomy in the RIGHT hepatic lobe (segment VIII) Bremsstrahlung planar and SPECT imaging of the abdomen following intrahepatic arterial delivery of [id number] demonstrates radioactivity localized to the RIGHT hepatic lobe. No evidence of extrahepatic activity. IMPRESSION: Successful Y - 90 microsphere delivery for treatment of unresectable liver metastasis. First therapy to the RIGHT lobe. Radiation segmentectomy  in the RIGHT hepatic lobe (segment VIII) Bremssstrahlung scan demonstrates activity localized to RIGHT hepatic lobe with no extrahepatic activity identified. Electronically Signed   By: Suzy Bouchard M.D.   On: 11/28/2021 15:18   DG Chest Portable 1 View  Result Date: 11/27/2021 CLINICAL DATA:  Missed dialysis EXAM: PORTABLE CHEST 1 VIEW COMPARISON:  11/26/2021 FINDINGS: Hazy mid and lower chest opacification with distinct ovoid density at the right base which is likely fissural fluid based on June 2023 chest CT. No interstitial edema, significant effusion, or pneumothorax. Chronic cardiomegaly. Extensive artifact from EKG leads. IMPRESSION: Unchanged cardiomegaly and pulmonary opacity. Electronically Signed   By: Jorje Guild M.D.   On: 11/27/2021 07:07   DG Chest 2 View  Result Date: 11/26/2021 CLINICAL DATA:  Shortness of breath. EXAM: CHEST - 2 VIEW COMPARISON:  11/23/2021. FINDINGS: The heart is enlarged and the mediastinal contour is stable. Atherosclerotic calcification of the aorta is noted. Perihilar airspace disease is noted bilaterally. There is blunting of the right  costophrenic angle suggesting small pleural effusion. No pneumothorax. Degenerative changes are present in the thoracic spine. A vascular stent is noted in the left axilla. IMPRESSION: 1. Cardiomegaly. 2. Patchy perihilar airspace disease bilaterally suggesting edema, less likely infection. 3. Small right pleural effusion. Electronically Signed   By: Brett Fairy M.D.   On: 11/26/2021 04:46   DG Chest 2 View  Result Date: 11/23/2021 CLINICAL DATA:  A 61 year old male presents for evaluation of shortness of breath and chest pain for 2 days. EXAM: CHEST - 2 VIEW COMPARISON:  November 07, 2021 in November 08, 2021 FINDINGS: Signs of vascular stenting over the LEFT axilla similar to previous imaging. Trachea midline. Cardiomediastinal contours and hilar structures are stable with moderate to marked cardiomegaly. Global increased interstitial markings in the chest compared to previous imaging. Small effusions suggested on lateral projection. No sign of lobar consolidative changes. Patchy opacities in the LEFT mid and RIGHT mid chest persists as seen on previous CT imaging with increase of an overlay of interstitial prominence. IMPRESSION: Moderate to marked cardiomegaly with signs of interstitial edema and small effusions. Findings most suggestive of heart failure or volume overload worsening since previous imaging. Persistent airspace/alveolar process in the mid chest bilaterally could reflect asymmetric edema or atypical infection in the background has been present, over a series of prior studies. Electronically Signed   By: Zetta Bills M.D.   On: 11/23/2021 08:36    Microbiology: No results found for this or any previous visit (from the past 240 hour(s)).   Labs: Basic Metabolic Panel: Recent Labs  Lab 12/09/21 0313 12/09/21 0415 12/09/21 1816 12/10/21 0502 12/10/21 0924 12/11/21 0610 12/11/21 0857  NA 113* 114* 129*  129* 127* 125* 129* 129*  K 4.6 4.3  --  4.0  --  3.6 3.5  CL 83* 82*  --  90*  --   92* 92*  CO2 15* 17*  --  26  --  26 27  GLUCOSE 92 93  --  102*  --  115* 143*  BUN 47* 46*  --  23  --  19 20  CREATININE 11.12* 11.07*  --  6.86*  --  6.11* 6.11*  CALCIUM 7.7* 7.7*  --  7.7*  --  7.8* 7.8*  MG  --   --  1.6*  --   --   --   --   PHOS  --   --  3.7 4.3  --   --  3.1  Liver Function Tests: Recent Labs  Lab 12/09/21 0313 12/09/21 1816 12/10/21 0502 12/11/21 0610 12/11/21 0857  AST 38 40 40 40  --   ALT '13 14 14 13  '$ --   ALKPHOS 109 104 104 105  --   BILITOT 1.1 1.3* 1.0 1.2  --   PROT 7.8 7.6 7.3 7.2  --   ALBUMIN 2.5* 2.4* 2.4* 2.3* 2.3*   No results for input(s): "LIPASE", "AMYLASE" in the last 168 hours. No results for input(s): "AMMONIA" in the last 168 hours. CBC: Recent Labs  Lab 12/09/21 0313 12/09/21 1816 12/10/21 0502 12/11/21 0610 12/11/21 0857  WBC 4.0  --  3.3* 3.4* 3.2*  NEUTROABS 3.1  --   --   --   --   HGB 5.1* 10.3* 6.7* 7.3* 7.1*  HCT 14.9* 31.1* 18.6* 21.6* 21.9*  MCV 100.0  --  95.4 94.7 96.5  PLT 104*  --  89* 88* 86*   Cardiac Enzymes: No results for input(s): "CKTOTAL", "CKMB", "CKMBINDEX", "TROPONINI" in the last 168 hours. BNP: BNP (last 3 results) Recent Labs    01/06/21 1804 06/14/21 0356 10/08/21 1201  BNP 193.9* 521.8* 1,688.9*    ProBNP (last 3 results) No results for input(s): "PROBNP" in the last 8760 hours.  CBG: Recent Labs  Lab 12/09/21 2058 12/10/21 0716 12/10/21 1205 12/11/21 0730 12/11/21 1702  GLUCAP 114* 86 118* 104* 125*       Signed:  Irine Seal MD.  [facility] 12/11/2021, 5:32 PM

## 2021-12-11 NOTE — Progress Notes (Signed)
Patient has been educated with printed AVS, no acute distress or pain noted, patient willingto drive his own car to go home, educated patient regarding bloody stool and requires him to seek medical attention.

## 2021-12-11 NOTE — Progress Notes (Signed)
Primary md at bedside

## 2021-12-12 ENCOUNTER — Telehealth: Payer: Self-pay | Admitting: Nephrology

## 2021-12-12 ENCOUNTER — Inpatient Hospital Stay (HOSPITAL_BASED_OUTPATIENT_CLINIC_OR_DEPARTMENT_OTHER): Payer: Medicare Other | Admitting: Hematology

## 2021-12-12 ENCOUNTER — Inpatient Hospital Stay: Payer: Medicare Other

## 2021-12-12 ENCOUNTER — Encounter: Payer: Self-pay | Admitting: Hematology

## 2021-12-12 ENCOUNTER — Other Ambulatory Visit: Payer: Self-pay

## 2021-12-12 ENCOUNTER — Telehealth: Payer: Self-pay | Admitting: *Deleted

## 2021-12-12 VITALS — BP 136/79 | HR 87 | Temp 98.1°F | Resp 15

## 2021-12-12 DIAGNOSIS — K922 Gastrointestinal hemorrhage, unspecified: Secondary | ICD-10-CM | POA: Insufficient documentation

## 2021-12-12 DIAGNOSIS — K31819 Angiodysplasia of stomach and duodenum without bleeding: Secondary | ICD-10-CM

## 2021-12-12 DIAGNOSIS — E1122 Type 2 diabetes mellitus with diabetic chronic kidney disease: Secondary | ICD-10-CM | POA: Insufficient documentation

## 2021-12-12 DIAGNOSIS — D500 Iron deficiency anemia secondary to blood loss (chronic): Secondary | ICD-10-CM | POA: Diagnosis present

## 2021-12-12 DIAGNOSIS — Z992 Dependence on renal dialysis: Secondary | ICD-10-CM | POA: Insufficient documentation

## 2021-12-12 DIAGNOSIS — K7460 Unspecified cirrhosis of liver: Secondary | ICD-10-CM | POA: Insufficient documentation

## 2021-12-12 DIAGNOSIS — N186 End stage renal disease: Secondary | ICD-10-CM | POA: Diagnosis not present

## 2021-12-12 DIAGNOSIS — Z79899 Other long term (current) drug therapy: Secondary | ICD-10-CM | POA: Diagnosis not present

## 2021-12-12 DIAGNOSIS — K921 Melena: Secondary | ICD-10-CM

## 2021-12-12 DIAGNOSIS — C220 Liver cell carcinoma: Secondary | ICD-10-CM | POA: Insufficient documentation

## 2021-12-12 DIAGNOSIS — D631 Anemia in chronic kidney disease: Secondary | ICD-10-CM

## 2021-12-12 DIAGNOSIS — I1311 Hypertensive heart and chronic kidney disease without heart failure, with stage 5 chronic kidney disease, or end stage renal disease: Secondary | ICD-10-CM | POA: Insufficient documentation

## 2021-12-12 DIAGNOSIS — B1920 Unspecified viral hepatitis C without hepatic coma: Secondary | ICD-10-CM | POA: Insufficient documentation

## 2021-12-12 LAB — COMPREHENSIVE METABOLIC PANEL
ALT: 10 U/L (ref 0–44)
AST: 38 U/L (ref 15–41)
Albumin: 3.1 g/dL — ABNORMAL LOW (ref 3.5–5.0)
Alkaline Phosphatase: 115 U/L (ref 38–126)
Anion gap: 8 (ref 5–15)
BUN: 24 mg/dL — ABNORMAL HIGH (ref 8–23)
CO2: 28 mmol/L (ref 22–32)
Calcium: 8.2 mg/dL — ABNORMAL LOW (ref 8.9–10.3)
Chloride: 94 mmol/L — ABNORMAL LOW (ref 98–111)
Creatinine, Ser: 5.44 mg/dL (ref 0.61–1.24)
GFR, Estimated: 11 mL/min — ABNORMAL LOW (ref >60)
Glucose, Bld: 124 mg/dL — ABNORMAL HIGH (ref 70–99)
Potassium: 3.6 mmol/L (ref 3.5–5.1)
Sodium: 130 mmol/L — ABNORMAL LOW (ref 135–145)
Total Bilirubin: 0.9 mg/dL (ref 0.3–1.2)
Total Protein: 7.8 g/dL (ref 6.5–8.1)

## 2021-12-12 LAB — CBC WITH DIFFERENTIAL/PLATELET
Abs Immature Granulocytes: 0.01 10*3/uL (ref 0.00–0.07)
Basophils Absolute: 0 10*3/uL (ref 0.0–0.1)
Basophils Relative: 0 %
Eosinophils Absolute: 0.1 10*3/uL (ref 0.0–0.5)
Eosinophils Relative: 2 %
HCT: 23.9 % — ABNORMAL LOW (ref 39.0–52.0)
Hemoglobin: 8.2 g/dL — ABNORMAL LOW (ref 13.0–17.0)
Immature Granulocytes: 0 %
Lymphocytes Relative: 6 %
Lymphs Abs: 0.2 10*3/uL — ABNORMAL LOW (ref 0.7–4.0)
MCH: 32.4 pg (ref 26.0–34.0)
MCHC: 34.3 g/dL (ref 30.0–36.0)
MCV: 94.5 fL (ref 80.0–100.0)
Monocytes Absolute: 0.5 10*3/uL (ref 0.1–1.0)
Monocytes Relative: 16 %
Neutro Abs: 2.5 10*3/uL (ref 1.7–7.7)
Neutrophils Relative %: 76 %
Platelets: 87 10*3/uL — ABNORMAL LOW (ref 150–400)
RBC: 2.53 MIL/uL — ABNORMAL LOW (ref 4.22–5.81)
RDW: 19.2 % — ABNORMAL HIGH (ref 11.5–15.5)
WBC: 3.3 10*3/uL — ABNORMAL LOW (ref 4.0–10.5)
nRBC: 0 % (ref 0.0–0.2)

## 2021-12-12 LAB — FERRITIN: Ferritin: 467 ng/mL — ABNORMAL HIGH (ref 24–336)

## 2021-12-12 LAB — SAMPLE TO BLOOD BANK

## 2021-12-12 NOTE — Telephone Encounter (Signed)
Transition of care contact from inpatient facility  Date of Discharge: 12/11/21 Date of Contact: 12/12/21 Method of contact: Phone  Attempted to contact patient to discuss transition of care from inpatient admission. Patient did not answer the phone. Will try to reach at outpatient dialysis.

## 2021-12-12 NOTE — Telephone Encounter (Signed)
Per [place] with Rx Prior Auth Team "The PA was denied because pt did not meet criteria: Acromegaly. Denial letter has been scanned in to pt's media file for further explanation." MD is aware. Pt currently has a referral in process with [facility].

## 2021-12-12 NOTE — Patient Outreach (Signed)
  Care Coordination [facility] Note Transition Care Management Unsuccessful Follow-up Telephone Call  Date of discharge and from where:  12/11/21 [facility]  Attempts:  1st Attempt  Reason for unsuccessful TCM follow-up call:  Left voice message  Chong Sicilian, BSN, RN-BC RN Care Coordinator Direct Dial: [phone]

## 2021-12-12 NOTE — Addendum Note (Signed)
Addended by: Truitt Merle on: 12/12/2021 02:24 PM   Modules accepted: Orders

## 2021-12-12 NOTE — Progress Notes (Addendum)
[name]   [phone] [phone]   Clinic Follow up Note   Patient Care Team: [name], NP as PCP - General (Internal Medicine) Lorretta Harp, MD as PCP - Cardiology (Cardiology) Center, [facility], NP as [place] [facility]  Date of Service:  12/12/2021  CHIEF COMPLAINT: f/u of Daniel [name], anemia  CURRENT THERAPY:  Surveillance -s/p Y90 11/28/21  ASSESSMENT & PLAN:  Daniel [name] is a 61 y.o. male with   1. Hepatocellular Carcinoma, cT2N0M0, stage II -non-hypermetabolic liver lesion incidentally seen on PET on 07/23/21 for lung nodule. No metabolic adenopathy. -presented to ED on 08/05/21 with recurrent tarry black stool and severe anemia, subsequently admitted. CT angio AP showed a large 5CM central liver lesion, further evaluated with MRI on 08/07/21 showing: 5 cm mass centered in segment 4A; adjacent 2 cm mass in right hepatic lobe (indeterminate); no adenopathy or distant metastasis.  The imaging finding was consistent with [place], liver biopsy not felt to be necessary for diagnosis of [place]. -baseline AFP on 08/07/21 was elevated at 13.9. -he has been in and out of the hospital since then due to anemia and GI bleed.  -he received one dose of sandostatin on 10/08/21 -due to transportation issues and several ED visits/hospitalizations, his Y90 procedure was delayed and finally performed on 11/28/21. Will repeat MRI in 3 months to evaluate his treatment response  -No plan for systemic therapy for now  -labs reviewed, hgb slightly improved to 8.2 today after receiving 2u pRBC in the hospital. -will arrange him for IV iron    2. 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 -has required numerous hospital admission and blood transfusions. -he received one dose beva on 09/05/21 -last IV Feraheme on 10/08/21, plan for another dose  today.   3. Liver Cirrhosis from untreated hepatitis C -history of alcohol abuse, he has cut down now  -chart review shows initial HCV positive in 05/2017, Hep B positive in 07/2019.   4. End-stage Renal Disease -on hemodialysis M/W/F     PLAN: -proceed with IV Feraheme on 8/19 -IV Feraheme in 1, 4, and 8 weeks -lab every 2 weeks x4 -f/u in 8 weeks   No problem-specific Assessment & Plan notes found for this encounter.   SUMMARY OF ONCOLOGIC HISTORY: Oncology History  Hepatocellular carcinoma ([place])  02/07/2021 Imaging   CLINICAL DATA:  Abdominal distension   EXAM: CT ABDOMEN AND PELVIS WITHOUT CONTRAST  IMPRESSION: Cholelithiasis with possible mild gallbladder wall thickening. Correlate for symptoms of right upper quadrant pain and consider further evaluation with gallbladder ultrasound.   Otherwise, no acute findings in the abdomen or pelvis.   Aortic aneurysm NOS (ICD10-I71.9).   02/07/2021 Imaging   CLINICAL DATA:  Cholelithiasis. Question cholecystitis. Abdominal distension.   EXAM: ULTRASOUND ABDOMEN LIMITED RIGHT UPPER QUADRANT  IMPRESSION: 1. Cholelithiasis with associated nonspecific gallbladder wall thickening. No pericholecystic fluid or sonographic Percell Miller sign reported. Gallbladder wall thickening can be seen in the setting of chronic liver disease. Correlate with clinical exam for acute cholecystitis. 2. Limited ultrasound to penetration of sonographic waves.   03/13/2021 Imaging   CLINICAL DATA:  61 year old male with generalized abdominal pain and bright red blood in stool x3 days.   EXAM: CT ABDOMEN AND PELVIS WITHOUT CONTRAST  IMPRESSION: 1. Chronic cholelithiasis, with indistinct appearance of the gallbladder wall similar to the CT last month. However, relatively contracted state of the  gallbladder argues against Acute Cholecystitis. But if there is right upper quadrant pain recommend Right Upper Quadrant Ultrasound may be valuable.    2. No other acute or inflammatory process identified in the non-contrast abdomen or pelvis.   3. Diffuse calcified atherosclerosis, Aortic atherosclerosis (ICD10-I70.0). Left nephrolithiasis. Mild large bowel diverticulosis.   07/23/2021 PET scan   IMPRESSION: 1. The ground-glass nodule in the RIGHT lower lobe is not well demonstrated on the nondiagnostic CT portion the PET-CT scan. No hypermetabolic lesion present on the PET data set within the RIGHT lower lobe. 2. Bands of intensely hypermetabolic peripheral atelectasis in the medial RIGHT lower lobe and lateral LEFT lower lobe/lingula. Favor benign benign atelectasis or other inflammatory process. Recommend follow-up CT (6 - 8 weeks) to demonstrate resolution. If pattern persists or increased tissue sampling may be warranted. 3. No metastatic adenopathy or distant metastatic disease. 4. Incidental findings of large central non hypermetabolic lesion in the liver. Recommend MRI of the liver with contrast for further characterization.   08/05/2021 Imaging   CLINICAL DATA:  GI bleed, melena, history of duodenal ulcer, anemia and end-stage renal disease.   EXAM: CT ANGIOGRAPHY ABDOMEN AND PELVIS WITH CONTRAST AND WITHOUT CONTRAST  IMPRESSION: 1. No active bleeding is identified into the gastrointestinal tract during arterial and venous phases of CT imaging. 2. Irregular outpouching along the medial aspect of the proximal duodenum near the expected position of the duodenal bulb or post bulbar duodenum. This is highly suspicious for a focal duodenal ulcer. There is some adjacent edema in the retroperitoneal fat. This may be the source of GI bleeding clinically. 3. Detection of a rounded 4.4 cm central hepatic mass near the juncture of segments IVb and VIII as well as a possible adjacent 2.2 cm mass in segment VIII. Main differential considerations are some type of well differentiated carcinoma such as hepatocellular carcinoma or  neuroendocrine carcinoma. Metastatic disease or cholangiocarcinoma is not excluded. Further evaluation with MRI of the abdomen with and without contrast is warranted.   08/06/2021 Initial Diagnosis   Hepatocellular carcinoma ([place])   08/07/2021 Imaging   CLINICAL DATA:  Liver mass, history of hepatitis-C   EXAM: MRI ABDOMEN WITHOUT AND WITH CONTRAST (INCLUDING MRCP)  IMPRESSION: 1. Hepatic cirrhosis. 5 cm mass centered in segment 4A is LI-RADS 5, consistent with hepatocellular carcinoma. 2. Adjacent 2 cm mass in the central right hepatic lobe as described, LI-RADS 3. Consider follow-up in 3-6 months. 3. Mild splenomegaly likely secondary to portal hypertension. 4. Trace ascites. 5. Small bilateral pleural effusions. Abdominal wall subcutaneous edema noted.   08/07/2021 Cancer Staging   Staging form: Liver, AJCC 8th Edition - Clinical stage from 08/07/2021: Stage II (cT2, cN0, cM0) - Signed by Truitt Merle, MD on 08/30/2021 Stage prefix: Initial diagnosis      INTERVAL HISTORY:  [name] is here for a follow up of [place]. He was last seen by me on 10/17/21. He presents to the clinic alone. He reports he is recovering well from recent hospitalization. He denies stomach pain but notes pain to his leg.   All other systems were reviewed with the patient and are negative.  MEDICAL HISTORY:  Past Medical History:  Diagnosis Date   Anemia    Diabetes mellitus without complication [facility])    patient denies   Dialysis patient [facility])    End stage chronic kidney disease ([place])    Hypertension    ICH (intracerebral hemorrhage) ([place]) 05/20/2017   PAD (peripheral artery disease) ([place])  Shoulder pain, left 06/28/2013    SURGICAL HISTORY: Past Surgical History:  Procedure Laterality Date   A/V FISTULAGRAM N/A 08/15/2020   Procedure: A/V FISTULAGRAM - Left Upper;  Surgeon: Cherre Robins, MD;  Location: [place] [facility];  Service: Cardiovascular;  Laterality: N/A;   ABDOMINAL  AORTOGRAM W/LOWER EXTREMITY N/A 04/08/2021   Procedure: ABDOMINAL AORTOGRAM W/LOWER EXTREMITY;  Surgeon: Waynetta Sandy, MD;  Location: [place] [facility];  Service: Cardiovascular;  Laterality: N/A;   AMPUTATION Left 04/16/2021   Procedure: LEFT BELOW KNEE AMPUTATION;  Surgeon: Angelia Mould, MD;  Location: [place];  Service: Vascular;  Laterality: Left;   AMPUTATION Left 06/17/2021   Procedure: REVISION AMPUTATION BELOW KNEE;  Surgeon: Broadus John, MD;  Location: [place];  Service: Vascular;  Laterality: Left;   APPENDECTOMY     APPLICATION OF WOUND VAC Left 06/17/2021   Procedure: APPLICATION OF WOUND VAC;  Surgeon: Broadus John, MD;  Location: [place];  Service: Vascular;  Laterality: Left;   AV FISTULA PLACEMENT Left 08/03/2019   Procedure: LEFT ARM ARTERIOVENOUS (AV) CEPHALIC  FISTULA CREATION;  Surgeon: Waynetta Sandy, MD;  Location: [place];  Service: Vascular;  Laterality: Left;   BIOPSY  06/30/2019   Procedure: BIOPSY;  Surgeon: Ronald Lobo, MD;  Location: [facility] ENDOSCOPY;  Service: Endoscopy;;   BIOPSY  08/02/2019   Procedure: BIOPSY;  Surgeon: Yetta Flock, MD;  Location: [facility] ENDOSCOPY;  Service: Gastroenterology;;   BIOPSY  02/08/2021   Procedure: BIOPSY;  Surgeon: Sharyn Creamer, MD;  Location: [facility] ENDOSCOPY;  Service: Gastroenterology;;   BIOPSY  02/24/2021   Procedure: BIOPSY;  Surgeon: [name], MD;  Location: [facility] ENDOSCOPY;  Service: Gastroenterology;;   BIOPSY  03/26/2021   Procedure: BIOPSY;  Surgeon: Lavena Bullion, DO;  Location: [place];  Service: Gastroenterology;;   BIOPSY  08/06/2021   Procedure: BIOPSY;  Surgeon: [name], MD;  Location: [place];  Service: Gastroenterology;;   BIOPSY  09/15/2021   Procedure: BIOPSY;  Surgeon: Sharyn Creamer, MD;  Location: [place];  Service: Gastroenterology;;   COLONOSCOPY  01/23/2012   Procedure: COLONOSCOPY;  Surgeon: Danie Binder, MD;  [facility];  Service: Endoscopy;  Laterality: N/A;  11:10 AM   COLONOSCOPY WITH PROPOFOL N/A 06/30/2019   Procedure: COLONOSCOPY WITH PROPOFOL;  Surgeon: Ronald Lobo, MD;  Location: [place];  Service: Endoscopy;  Laterality: N/A;   ENTEROSCOPY N/A 08/02/2019   Procedure: ENTEROSCOPY;  Surgeon: Yetta Flock, MD;  Location: [facility] ENDOSCOPY;  Service: Gastroenterology;  Laterality: N/A;   ENTEROSCOPY N/A 02/24/2021   Procedure: ENTEROSCOPY;  Surgeon: [name], MD;  Location: [facility] ENDOSCOPY;  Service: Gastroenterology;  Laterality: N/A;   ENTEROSCOPY N/A 03/26/2021   Procedure: ENTEROSCOPY;  Surgeon: Lavena Bullion, DO;  Location: [place];  Service: Gastroenterology;  Laterality: N/A;   ENTEROSCOPY N/A 07/05/2021   Procedure: ENTEROSCOPY;  Surgeon: Carol Ada, MD;  Location: [place];  Service: Gastroenterology;  Laterality: N/A;   ENTEROSCOPY N/A 08/06/2021   Procedure: ENTEROSCOPY;  Surgeon: [name], MD;  Location: [facility] ENDOSCOPY;  Service: Gastroenterology;  Laterality: N/A;   ENTEROSCOPY N/A 08/24/2021   Procedure: ENTEROSCOPY;  Surgeon: Ladene Artist, MD;  Location: [facility] ENDOSCOPY;  Service: Gastroenterology;  Laterality: N/A;   ENTEROSCOPY N/A 09/15/2021   Procedure: ENTEROSCOPY;  Surgeon: Sharyn Creamer, MD;  Location: [facility] ENDOSCOPY;  Service: Gastroenterology;  Laterality: N/A;   ENTEROSCOPY N/A 10/06/2021  Procedure: ENTEROSCOPY;  Surgeon: [name], MD;  Location: [place];  Service: Gastroenterology;  Laterality: N/A;   ENTEROSCOPY N/A 11/14/2021   Procedure: ENTEROSCOPY;  Surgeon: Doran Stabler, MD;  Location: [facility] ENDOSCOPY;  Service: Gastroenterology;  Laterality: N/A;   ESOPHAGOGASTRODUODENOSCOPY N/A 08/10/2020   Procedure: ESOPHAGOGASTRODUODENOSCOPY (EGD);  Surgeon: Jerene Bears, MD;  Location: [facility] ENDOSCOPY;  Service: Gastroenterology;  Laterality: N/A;   ESOPHAGOGASTRODUODENOSCOPY (EGD) WITH PROPOFOL N/A 06/30/2019   Procedure:  ESOPHAGOGASTRODUODENOSCOPY (EGD) WITH PROPOFOL;  Surgeon: Ronald Lobo, MD;  Location: [place];  Service: Endoscopy;  Laterality: N/A;   ESOPHAGOGASTRODUODENOSCOPY (EGD) WITH PROPOFOL N/A 01/12/2021   Procedure: ESOPHAGOGASTRODUODENOSCOPY (EGD) WITH PROPOFOL;  Surgeon: Sharyn Creamer, MD;  Location: [place];  Service: Gastroenterology;  Laterality: N/A;   ESOPHAGOGASTRODUODENOSCOPY (EGD) WITH PROPOFOL N/A 02/08/2021   Procedure: ESOPHAGOGASTRODUODENOSCOPY (EGD) WITH PROPOFOL;  Surgeon: Sharyn Creamer, MD;  Location: [place];  Service: Gastroenterology;  Laterality: N/A;   FISTULA SUPERFICIALIZATION Left 10/17/2019   Procedure: LEFT UPPER EXTREMITY FISTULA REVISION, SIDE BRANCH LIGATION,  AND SUPERFICIALIZATION;  Surgeon: Marty Heck, MD;  Location: [place];  Service: Vascular;  Laterality: Left;   FISTULA SUPERFICIALIZATION Left 25/08/3974   Procedure: PLICATION OF ANEURYSM LEFT ARTERIOVENOUS FISTULA;  Surgeon: Angelia Mould, MD;  Location: [place];  Service: Vascular;  Laterality: Left;   FLEXIBLE SIGMOIDOSCOPY N/A 07/05/2021   Procedure: FLEXIBLE SIGMOIDOSCOPY;  Surgeon: Carol Ada, MD;  Location: [place];  Service: Gastroenterology;  Laterality: N/A;   GIVENS CAPSULE STUDY N/A 06/30/2019   Procedure: GIVENS CAPSULE STUDY;  Surgeon: Ronald Lobo, MD;  Location: [place];  Service: Endoscopy;  Laterality: N/A;   GIVENS CAPSULE STUDY N/A 08/25/2021   Procedure: GIVENS CAPSULE STUDY;  Surgeon: Ladene Artist, MD;  Location: [place];  Service: Gastroenterology;  Laterality: N/A;   GIVENS CAPSULE STUDY N/A 10/06/2021   Procedure: GIVENS CAPSULE STUDY;  Surgeon: [name], MD;  Location: [place];  Service: Gastroenterology;  Laterality: N/A;   HEMOSTASIS CLIP PLACEMENT  08/10/2020   Procedure: HEMOSTASIS CLIP PLACEMENT;  Surgeon: Jerene Bears, MD;  Location: [place];  Service: Gastroenterology;;   HEMOSTASIS CLIP PLACEMENT  01/12/2021    Procedure: HEMOSTASIS CLIP PLACEMENT;  Surgeon: Sharyn Creamer, MD;  Location: [place];  Service: Gastroenterology;;   HEMOSTASIS CONTROL  08/02/2019   Procedure: HEMOSTASIS CONTROL;  Surgeon: Yetta Flock, MD;  Location: [place];  Service: Gastroenterology;;   HOT HEMOSTASIS  02/24/2021   Procedure: HOT HEMOSTASIS (ARGON PLASMA COAGULATION/BICAP);  Surgeon: [name], MD;  Location: [facility] ENDOSCOPY;  Service: Gastroenterology;;   HOT HEMOSTASIS N/A 03/26/2021   Procedure: HOT HEMOSTASIS (ARGON PLASMA COAGULATION/BICAP);  Surgeon: Lavena Bullion, DO;  Location: [facility] ENDOSCOPY;  Service: Gastroenterology;  Laterality: N/A;   HOT HEMOSTASIS N/A 07/05/2021   Procedure: HOT HEMOSTASIS (ARGON PLASMA COAGULATION/BICAP);  Surgeon: Carol Ada, MD;  Location: [place];  Service: Gastroenterology;  Laterality: N/A;   HOT HEMOSTASIS N/A 09/15/2021   Procedure: HOT HEMOSTASIS (ARGON PLASMA COAGULATION/BICAP);  Surgeon: Sharyn Creamer, MD;  Location: [place];  Service: Gastroenterology;  Laterality: N/A;   HOT HEMOSTASIS N/A 10/06/2021   Procedure: HOT HEMOSTASIS (ARGON PLASMA COAGULATION/BICAP);  Surgeon: [name], MD;  Location: [place];  Service: Gastroenterology;  Laterality: N/A;   INCISION AND DRAINAGE ABSCESS N/A 06/29/2016   Procedure: INCISION AND DRAINAGE ABDOMINAL WALL ABSCESS;  Surgeon: Alphonsa Overall, MD;  Location: [facility];  Service: General;  Laterality: N/A;   INSERTION  OF DIALYSIS CATHETER Right 08/03/2019   Procedure: INSERTION OF DIALYSIS CATHETER;  Surgeon: Waynetta Sandy, MD;  Location: [place];  Service: Vascular;  Laterality: Right;   INSERTION OF DIALYSIS CATHETER Right 10/22/2019   Procedure: INSERTION OF 23CM TUNNELED DIALYSIS CATHETER RIGHT INTERNAL JUGULAR;  Surgeon: Angelia Mould, MD;  Location: [place];  Service: Vascular;  Laterality: Right;   INSERTION OF DIALYSIS CATHETER Right 08/12/2020   Procedure: INSERTION OF  Right internal Jugular TUNNELED  DIALYSIS CATHETER.;  Surgeon: Waynetta Sandy, MD;  Location: [place];  Service: Vascular;  Laterality: Right;   INSERTION OF DIALYSIS CATHETER N/A 04/19/2021   Procedure: INSERTION OF TUNNELED DIALYSIS CATHETER;  Surgeon: Angelia Mould, MD;  [facility];  Service: Vascular;  Laterality: N/A;   IR 3D INDEPENDENT WKST  11/07/2021   IR ANGIOGRAM SELECTIVE EACH ADDITIONAL VESSEL  11/07/2021   IR ANGIOGRAM SELECTIVE EACH ADDITIONAL VESSEL  11/07/2021   IR ANGIOGRAM SELECTIVE EACH ADDITIONAL VESSEL  11/07/2021   IR ANGIOGRAM SELECTIVE EACH ADDITIONAL VESSEL  11/28/2021   IR ANGIOGRAM VISCERAL SELECTIVE  11/07/2021   IR ANGIOGRAM VISCERAL SELECTIVE  11/28/2021   IR EMBO ARTERIAL NOT HEMORR HEMANG INC GUIDE ROADMAPPING  11/07/2021   IR EMBO TUMOR ORGAN ISCHEMIA INFARCT INC GUIDE ROADMAPPING  11/28/2021   IR RADIOLOGIST EVAL & MGMT  09/12/2021   IR US GUIDE VASC ACCESS LEFT  11/07/2021   IR US GUIDE VASC ACCESS LEFT  11/28/2021   Left heel surgery     LOWER EXTREMITY ANGIOGRAPHY N/A 07/08/2021   Procedure: Lower Extremity Angiography;  Surgeon: Cherre Robins, MD;  Location: [place] [facility];  Service: Cardiovascular;  Laterality: N/A;   PENILE BIOPSY N/A 03/26/2020   Procedure: PENILE ULCER DEBRIDEMENT;  Surgeon: Remi Haggard, MD;  Location[facility];  Service: Urology;  Laterality: N/A;  30 MINS   PERIPHERAL VASCULAR INTERVENTION Right 07/08/2021   Procedure: PERIPHERAL VASCULAR INTERVENTION;  Surgeon: Cherre Robins, MD;  Location: [place] [facility];  Service: Cardiovascular;  Laterality: Right;   SCLEROTHERAPY  01/12/2021   Procedure: SCLEROTHERAPY;  Surgeon: Sharyn Creamer, MD;  Location: [facility] ENDOSCOPY;  Service: Gastroenterology;;   Clide Deutscher  02/24/2021   Procedure: Clide Deutscher;  Surgeon: [name], MD;  Location: [place];  Service: Gastroenterology;;   SUBMUCOSAL TATTOO INJECTION  08/24/2021   Procedure: SUBMUCOSAL TATTOO  INJECTION;  Surgeon: Ladene Artist, MD;  Location: [place];  Service: Gastroenterology;;   SUBMUCOSAL TATTOO INJECTION  09/15/2021   Procedure: SUBMUCOSAL TATTOO INJECTION;  Surgeon: Sharyn Creamer, MD;  Location: [facility] ENDOSCOPY;  Service: Gastroenterology;;    I have reviewed the social history and family history with the patient and they are unchanged from previous note.  ALLERGIES:  is allergic to seroquel [quetiapine] and dilaudid [hydromorphone hcl].  MEDICATIONS:  Current Outpatient Medications  Medication Sig Dispense Refill   amLODipine (NORVASC) 5 MG tablet Take 1 tablet (5 mg total) by mouth daily. 30 tablet 2   atorvastatin (LIPITOR) 40 MG tablet Take 1 tablet (40 mg total) by mouth daily. 30 tablet 2   folic acid (FOLVITE) 1 MG tablet Take 1 tablet (1 mg total) by mouth daily.     gabapentin (NEURONTIN) 300 MG capsule Take 1 capsule (300 mg total) by mouth at bedtime. 90 capsule 3   Multiple Vitamin (MULTIVITAMIN WITH MINERALS) TABS tablet Take 1 tablet by mouth daily.     pantoprazole (PROTONIX) 40 MG tablet Take 1 tablet (40 mg  total) by mouth 2 (two) times daily before a meal. 60 tablet 2   thiamine (VITAMIN B1) 100 MG tablet Take 1 tablet (100 mg total) by mouth daily.     traZODone (DESYREL) 50 MG tablet Take 1 tablet (50 mg total) by mouth at bedtime as needed for sleep. 90 tablet 1   No current facility-administered medications for this visit.    PHYSICAL EXAMINATION: ECOG PERFORMANCE STATUS: 2 - Symptomatic, <50% confined to bed  Vitals:   12/12/21 0912  BP: 136/79  Pulse: 87  Resp: 15  Temp: 98.1 F (36.7 C)  SpO2: 100%   Wt Readings from Last 3 Encounters:  12/10/21 241 lb 10 oz (109.6 kg)  11/28/21 240 lb (108.9 kg)  11/13/21 233 lb 7.5 oz (105.9 kg)     GENERAL:alert, no distress and comfortable SKIN: skin color normal, no rashes or significant lesions EYES: normal, Conjunctiva are pink and non-injected, sclera clear  NEURO: alert & oriented x  3 with fluent speech  LABORATORY DATA:  I have reviewed the data as listed    Latest Ref Rng & Units 12/12/2021    8:38 AM 12/11/2021    8:57 AM 12/11/2021    6:10 AM  CBC  WBC 4.0 - 10.5 K/uL 3.3  3.2  3.4   Hemoglobin 13.0 - 17.0 g/dL 8.2  7.1  7.3   Hematocrit 39.0 - 52.0 % 23.9  21.9  21.6   Platelets 150 - 400 K/uL 87  86  88         Latest Ref Rng & Units 12/12/2021    8:38 AM 12/11/2021    8:57 AM 12/11/2021    6:10 AM  CMP  Glucose 70 - 99 mg/dL 124  143  115   BUN 8 - 23 mg/dL '24  20  19   '$ Creatinine 0.61 - 1.24 mg/dL 5.44  6.11  6.11   Sodium 135 - 145 mmol/L 130  129  129   Potassium 3.5 - 5.1 mmol/L 3.6  3.5  3.6   Chloride 98 - 111 mmol/L 94  92  92   CO2 22 - 32 mmol/L '28  27  26   '$ Calcium 8.9 - 10.3 mg/dL 8.2  7.8  7.8   Total Protein 6.5 - 8.1 g/dL 7.8   7.2   Total Bilirubin 0.3 - 1.2 mg/dL 0.9   1.2   Alkaline Phos 38 - 126 U/L 115   105   AST 15 - 41 U/L 38   40   ALT 0 - 44 U/L 10   13       RADIOGRAPHIC STUDIES: I have personally reviewed the radiological images as listed and agreed with the findings in the report. DG CHEST PORT 1 VIEW  Result Date: 12/10/2021 CLINICAL DATA:  dialysis patient with shortness of breath. EXAM: PORTABLE CHEST 1 VIEW COMPARISON:  12/09/2021 FINDINGS: Stable cardiomediastinal contours. Aortic atherosclerotic calcifications. Unchanged appearance of hazy opacity within the left midlung and bilateral lung bases. No atelectasis. No definite pleural effusion identified. IMPRESSION: No change in left mid lung and bilateral lung base hazy opacities which may reflect areas of asymmetric edema versus infection. Cardiac enlargement. Aortic Atherosclerosis (ICD10-I70.0). Electronically Signed   By: Kerby Moors M.D.   On: 12/10/2021 10:59      No orders of the defined types were placed in this encounter.  All questions were answered. The patient knows to call the clinic with any problems, questions or concerns. No barriers to  learning  was detected. The total time spent in the appointment was 30 minutes.     Truitt Merle, MD 12/12/2021   I, Wilburn Mylar, am acting as scribe for Truitt Merle, MD.   I have reviewed the above documentation for accuracy and completeness, and I agree with the above.

## 2021-12-13 ENCOUNTER — Ambulatory Visit: Payer: Self-pay | Admitting: *Deleted

## 2021-12-13 LAB — TYPE AND SCREEN
ABO/RH(D): B POS
Antibody Screen: NEGATIVE
Unit division: 0
Unit division: 0
Unit division: 0
Unit division: 0

## 2021-12-13 LAB — BPAM RBC
Blood Product Expiration Date: 202308222359
Blood Product Expiration Date: 202309082359
Blood Product Expiration Date: 202309082359
Blood Product Expiration Date: 202309082359
ISSUE DATE / TIME: 202308140610
ISSUE DATE / TIME: 202308151441
ISSUE DATE / TIME: 202308180803
ISSUE DATE / TIME: 202308180803
Unit Type and Rh: 7300
Unit Type and Rh: 7300
Unit Type and Rh: 7300
Unit Type and Rh: 7300

## 2021-12-13 MED FILL — Ferumoxytol Inj 510 MG/17ML (30 MG/ML) (Elemental Fe): INTRAVENOUS | Qty: 17 | Status: AC

## 2021-12-13 NOTE — Patient Outreach (Signed)
  Care Coordination TOC Note Transition Care Management Follow-up Telephone Call Date of discharge and from where: Today 12/13/21 from [facility] per pt but MR indicates he left 12/11/21 How have you been since you were released from the hospital? Terrible Any questions or concerns? Yes - still don't feel good, still waiting on housing [facility] applications previously completed in June.  Items Reviewed: Did the pt receive and understand the discharge instructions provided?  Received but has not read them. Medications obtained and verified? Did not want to go through the list. Other? No  Any new allergies since your discharge? Yes  Dietary orders reviewed? Yes Do you have support at home? No   Home Care and Equipment/Supplies: Were home health services ordered? no If so, what is the name of the agency? N/A  Has the agency set up a time to come to the patient's home? not applicable Were any new equipment or medical supplies ordered?  No What is the name of the medical supply agency? N/A Were you able to get the supplies/equipment? not applicable Do you have any questions related to the use of the equipment or supplies? No  Functional Questionnaire: (I = Independent and D = Dependent) ADLs: I   Bathing/Dressing- I  Meal Prep- A  Eating- I  Maintaining continence- I  Transferring/Ambulation- I  Managing Meds- I  Follow up appointments reviewed:  PCP Hospital f/u appt confirmed? Yes  Scheduled to see DR. EDWARDS  on 8/22 @ 11 AM. Specialist Hospital f/u appt confirmed? No  Scheduled to see [facility]  [facility] PENDING NOTIFICATION. Are transportation arrangements needed? Yes  If their condition worsens, is the pt aware to call PCP or go to the Emergency Dept.? Yes Was the patient provided with contact information for the PCP's office or ED? Yes Was to pt encouraged to call back with questions or concerns? Yes  SDOH assessments and interventions completed:   Yes  Care Coordination Interventions  Activated:  Yes   Care Coordination Interventions:   SEE GOALS     Encounter Outcome:  Pt. Visit Completed    Daniel Hoehn C. Burgess Estelle, MSN, [facility] Gerontological Nurse Practitioner [facility] Care Management [phone]

## 2021-12-14 ENCOUNTER — Inpatient Hospital Stay: Payer: Medicare Other

## 2021-12-14 VITALS — BP 168/91 | HR 82 | Temp 98.4°F | Resp 18

## 2021-12-14 DIAGNOSIS — D500 Iron deficiency anemia secondary to blood loss (chronic): Secondary | ICD-10-CM | POA: Diagnosis not present

## 2021-12-14 DIAGNOSIS — D649 Anemia, unspecified: Secondary | ICD-10-CM

## 2021-12-14 DIAGNOSIS — C220 Liver cell carcinoma: Secondary | ICD-10-CM

## 2021-12-14 LAB — AFP TUMOR MARKER: AFP, Serum, Tumor Marker: 7.3 ng/mL (ref 0.0–8.4)

## 2021-12-14 MED ORDER — SODIUM CHLORIDE 0.9% IV SOLUTION
250.0000 mL | Freq: Once | INTRAVENOUS | Status: AC
  Administered 2021-12-14: 250 mL via INTRAVENOUS

## 2021-12-14 MED ORDER — SODIUM CHLORIDE 0.9 % IV SOLN
510.0000 mg | Freq: Once | INTRAVENOUS | Status: AC
  Administered 2021-12-14: 510 mg via INTRAVENOUS
  Filled 2021-12-14: qty 17

## 2021-12-16 ENCOUNTER — Encounter (INDEPENDENT_AMBULATORY_CARE_PROVIDER_SITE_OTHER): Payer: Self-pay | Admitting: Primary Care

## 2021-12-16 ENCOUNTER — Telehealth: Payer: Self-pay

## 2021-12-16 NOTE — Telephone Encounter (Signed)
Transition Care Management Follow-up Telephone Call Date of discharge and from where: 8/16/203,[facility] How have you been since you were released from the hospital? He said his feet are starting to swell. He was at dialysis at the time of this call.  Any questions or concerns? No  Items Reviewed: Did the pt receive and understand the discharge instructions provided? Yes  Medications obtained and verified? Yes - he said he has all of his medications and did not have any questions about the med regime at this time Other? No  Any new allergies since your discharge? No  Dietary orders reviewed? No Do you have support at home?  Lives in a rooming house and he said he takes care of himself. He is not sure where he is on the waiting list with [employer].   Home Care and Equipment/Supplies: Were home health services ordered? no If so, what is the name of the agency? N/a  Has the agency set up a time to come to the patient's home? not applicable Were any new equipment or medical supplies ordered?  No What is the name of the medical supply agency? N/a Were you able to get the supplies/equipment? not applicable Do you have any questions related to the use of the equipment or supplies? No  Functional Questionnaire: (I = Independent and D = Dependent) ADLs: uses his wheelchair for mobility.  Independent with personal care. He said he is waiting for [place] to bring him another wheelchair.  He [facility]ends HD[facility] M[facility]W/F at [facility]   Follow up appointments reviewed:  PCP Hospital f/u appt confirmed? Yes  Scheduled to see Jodene Nam, NP - tomorrow -12/17/2021.  [facility] f/u appt confirmed? Yes  Scheduled to see cardiology -12/31/2021. Are transportation arrangements needed? No  If their condition worsens, is the pt aware to call PCP or go to the Emergency Dept.? Yes Was the patient provided with contact information for the PCP's office or ED? Yes Was to pt  encouraged to call back with questions or concerns? Yes

## 2021-12-17 ENCOUNTER — Telehealth: Payer: Self-pay | Admitting: Hematology

## 2021-12-17 ENCOUNTER — Ambulatory Visit (INDEPENDENT_AMBULATORY_CARE_PROVIDER_SITE_OTHER): Payer: Medicare Other | Admitting: Primary Care

## 2021-12-17 ENCOUNTER — Encounter: Payer: Self-pay | Admitting: Physician Assistant

## 2021-12-17 ENCOUNTER — Encounter (INDEPENDENT_AMBULATORY_CARE_PROVIDER_SITE_OTHER): Payer: Self-pay | Admitting: Primary Care

## 2021-12-17 VITALS — BP 151/84 | HR 83 | Temp 97.9°F | Ht 71.0 in | Wt 242.2 lb

## 2021-12-17 DIAGNOSIS — Z89512 Acquired absence of left leg below knee: Secondary | ICD-10-CM | POA: Diagnosis not present

## 2021-12-17 NOTE — Telephone Encounter (Signed)
Scheduled follow-up appointments per 8/17 los. Patient is aware. Mailed calendar.

## 2021-12-17 NOTE — Progress Notes (Addendum)
[place], is a 61 y.o. male  SEG:[id number]  VPX:[id number]  DOB - Nov 03, 1960  Chief Complaint  Patient presents with   face to face enocounter for wheelchair       Subjective:   Daniel [name] is a 61 y.o. male here today for a  face to face visit for a wheel chair. 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  A cane and/or walker will not resolve issue with performing activities of daily living. A wheelchair will allow patient to safely perform daily activities.  Duration of need: 99 year .Patient has No headache, No chest pain, No abdominal pain - No Nausea, No new weakness tingling or numbness, No Cough - shortness of breath  No problems updated.  Allergies  Allergen Reactions   Seroquel [Quetiapine] Other (See Comments)    Tardive kinesia Dystonia    Dilaudid [Hydromorphone Hcl] Itching and Other (See Comments)    Pt reports itchiness after IM injection     Past Medical History:  Diagnosis Date   Anemia    Diabetes mellitus without complication ([place])    patient denies   Dialysis patient [facility])    End stage chronic kidney disease ([place])    Hypertension    ICH (intracerebral hemorrhage) ([place]) 05/20/2017   PAD (peripheral artery disease) ([facility])    Shoulder pain, left 06/28/2013    Current Outpatient Medications on File Prior to Visit  Medication Sig Dispense Refill   amLODipine (NORVASC) 5 MG tablet Take 1 tablet (5 mg total) by mouth daily. 30 tablet 2   atorvastatin (LIPITOR) 40 MG tablet Take 1 tablet (40 mg total) by mouth daily. 30 tablet 2   folic acid (FOLVITE) 1 MG tablet Take 1 tablet (1 mg total) by mouth daily.     gabapentin (NEURONTIN) 300 MG capsule Take 1 capsule (300 mg total) by mouth at bedtime. 90 capsule 3   Multiple Vitamin (MULTIVITAMIN WITH MINERALS) TABS tablet Take 1 tablet by mouth daily.     pantoprazole (PROTONIX) 40 MG  tablet Take 1 tablet (40 mg total) by mouth 2 (two) times daily before a meal. 60 tablet 2   thiamine (VITAMIN B1) 100 MG tablet Take 1 tablet (100 mg total) by mouth daily.     traZODone (DESYREL) 50 MG tablet Take 1 tablet (50 mg total) by mouth at bedtime as needed for sleep. 90 tablet 1   [DISCONTINUED] colchicine 0.6 MG tablet Take 0.5 tablets (0.3 mg total) by mouth 2 (two) times daily. 14 tablet 0   [DISCONTINUED] furosemide (LASIX) 40 MG tablet Take 1 tablet (40 mg total) by mouth daily. 30 tablet 0   No current facility-administered medications on file prior to visit.    Objective:   Vitals:   12/17/21 1131 12/17/21 1145  BP: (!) 150/83 (!) 151/84  Pulse: 83 83  Temp: 97.9 F (36.6 C)   TempSrc: Oral   SpO2: 94%   Weight: 242 lb 3.2 oz (109.9 kg)   Height: '5\' 11"'$  (1.803 m)     Exam General appearance : Awake, alert, not in any distress. Speech Clear. Not toxic looking HEENT: Atraumatic and Normocephalic, pupils equally reactive to light and accomodation Neck: Supple, no JVD. No cervical lymphadenopathy.  Chest: Good air entry bilaterally, no added sounds  CVS: S1 S2 regular, no murmurs.  Abdomen: Bowel sounds present, Non tender and not distended with no gaurding, rigidity or  rebound. Extremities: Right  Lower Ext shows no edema Left BKA is wrapped  Neurology: Awake alert, and oriented X 3, CN II-XII intact, Non focal Skin: No Rash  Data Review Lab Results  Component Value Date   HGBA1C 5.2 09/03/2021   HGBA1C 5.4 09/26/2020   HGBA1C 6.4 (H) 03/26/2020    Assessment & Plan  Daniel [name] was seen today for face to face enocounter for wheelchair.  Diagnoses and all orders for this visit:  Hx of BKA, left ([place]) Visit was face-to-face for the need of a power wheelchair to increase independence and activities of daily living     Patient have been counseled extensively about nutrition and exercise. Other issues discussed during this visit include: low cholesterol  diet, weight control and daily exercise, foot care, annual eye examinations at Ophthalmology, importance of adherence with medications and regular follow-up. We also discussed long term complications of uncontrolled diabetes and hypertension.   No follow-ups on file.  The patient was given clear instructions to go to ER or return to medical center if symptoms don't improve, worsen or new problems develop. The patient verbalized understanding. The patient was told to call to get lab results if they haven't heard anything in the next week.   This note has been created with Surveyor, quantity. Any transcriptional errors are unintentional.   [name], NP 12/18/2021, 10:39 PM

## 2021-12-17 NOTE — Progress Notes (Signed)
Needs refill of medications  Requesting medication for pain

## 2021-12-19 ENCOUNTER — Telehealth (INDEPENDENT_AMBULATORY_CARE_PROVIDER_SITE_OTHER): Payer: Self-pay | Admitting: Primary Care

## 2021-12-19 ENCOUNTER — Ambulatory Visit: Payer: Self-pay | Admitting: *Deleted

## 2021-12-19 NOTE — Patient Outreach (Signed)
[facility]) Care Management  12/19/2021  Daniel [name] 1960-07-29 [id number]  Called to schedule a home for visit next week. We agreed on Tuesday, August 29th at 3:00 pm.  Kayleen Memos C. Myrtie Neither, MSN, [facility] Gerontological Nurse Practitioner [facility] Care Management [phone]

## 2021-12-19 NOTE — Telephone Encounter (Signed)
Cam with [facility] has some question about patients medications  CB@  [phone]

## 2021-12-19 NOTE — Telephone Encounter (Signed)
Spoke with Cam she wanted to inform PCP that patient was not taking folic acid, B1 or multivitamins. Patient reported to Cam that PCP refused to refill medications. Cam contacted pharmacy and was informed he had refills on file so she advised him to pick them up. She had questions on if he should continue taking. Please advise.

## 2021-12-20 ENCOUNTER — Encounter (HOSPITAL_COMMUNITY): Payer: Self-pay | Admitting: Emergency Medicine

## 2021-12-20 ENCOUNTER — Emergency Department (HOSPITAL_COMMUNITY)
Admission: EM | Admit: 2021-12-20 | Discharge: 2021-12-20 | Disposition: A | Discharge status: Home / Self Care | Payer: Medicare Other | Attending: Emergency Medicine | Admitting: Emergency Medicine

## 2021-12-20 ENCOUNTER — Emergency Department (HOSPITAL_COMMUNITY): Payer: Medicare Other

## 2021-12-20 ENCOUNTER — Other Ambulatory Visit: Payer: Self-pay

## 2021-12-20 DIAGNOSIS — R600 Localized edema: Secondary | ICD-10-CM | POA: Diagnosis not present

## 2021-12-20 DIAGNOSIS — M7989 Other specified soft tissue disorders: Secondary | ICD-10-CM | POA: Insufficient documentation

## 2021-12-20 DIAGNOSIS — R0602 Shortness of breath: Secondary | ICD-10-CM | POA: Diagnosis present

## 2021-12-20 DIAGNOSIS — R062 Wheezing: Secondary | ICD-10-CM | POA: Diagnosis not present

## 2021-12-20 DIAGNOSIS — D696 Thrombocytopenia, unspecified: Secondary | ICD-10-CM

## 2021-12-20 DIAGNOSIS — N186 End stage renal disease: Secondary | ICD-10-CM | POA: Diagnosis not present

## 2021-12-20 DIAGNOSIS — Z992 Dependence on renal dialysis: Secondary | ICD-10-CM | POA: Insufficient documentation

## 2021-12-20 DIAGNOSIS — Z20822 Contact with and (suspected) exposure to covid-19: Secondary | ICD-10-CM | POA: Diagnosis not present

## 2021-12-20 DIAGNOSIS — R160 Hepatomegaly, not elsewhere classified: Secondary | ICD-10-CM

## 2021-12-20 DIAGNOSIS — D649 Anemia, unspecified: Secondary | ICD-10-CM

## 2021-12-20 DIAGNOSIS — I10 Essential (primary) hypertension: Secondary | ICD-10-CM

## 2021-12-20 LAB — COMPREHENSIVE METABOLIC PANEL
ALT: 15 U/L (ref 0–44)
AST: 48 U/L — ABNORMAL HIGH (ref 15–41)
Albumin: 2.3 g/dL — ABNORMAL LOW (ref 3.5–5.0)
Alkaline Phosphatase: 108 U/L (ref 38–126)
Anion gap: 16 — ABNORMAL HIGH (ref 5–15)
BUN: 38 mg/dL — ABNORMAL HIGH (ref 8–23)
CO2: 21 mmol/L — ABNORMAL LOW (ref 22–32)
Calcium: 8.6 mg/dL — ABNORMAL LOW (ref 8.9–10.3)
Chloride: 91 mmol/L — ABNORMAL LOW (ref 98–111)
Creatinine, Ser: 8.94 mg/dL — ABNORMAL HIGH (ref 0.61–1.24)
GFR, Estimated: 6 mL/min — ABNORMAL LOW (ref >60)
Glucose, Bld: 86 mg/dL (ref 70–99)
Potassium: 3.9 mmol/L (ref 3.5–5.1)
Sodium: 128 mmol/L — ABNORMAL LOW (ref 135–145)
Total Bilirubin: 1.3 mg/dL — ABNORMAL HIGH (ref 0.3–1.2)
Total Protein: 7.8 g/dL (ref 6.5–8.1)

## 2021-12-20 LAB — CBC
HCT: 23.7 % — ABNORMAL LOW (ref 39.0–52.0)
Hemoglobin: 8.2 g/dL — ABNORMAL LOW (ref 13.0–17.0)
MCH: 32.5 pg (ref 26.0–34.0)
MCHC: 34.6 g/dL (ref 30.0–36.0)
MCV: 94 fL (ref 80.0–100.0)
Platelets: 92 10*3/uL — ABNORMAL LOW (ref 150–400)
RBC: 2.52 MIL/uL — ABNORMAL LOW (ref 4.22–5.81)
RDW: 17.6 % — ABNORMAL HIGH (ref 11.5–15.5)
WBC: 3.2 10*3/uL — ABNORMAL LOW (ref 4.0–10.5)
nRBC: 0 % (ref 0.0–0.2)

## 2021-12-20 LAB — RESP PANEL BY RT-PCR (FLU A&B, COVID) ARPGX2
Influenza A by PCR: NEGATIVE
Influenza B by PCR: NEGATIVE
SARS Coronavirus 2 by RT PCR: NEGATIVE

## 2021-12-20 MED ORDER — CHLORHEXIDINE GLUCONATE CLOTH 2 % EX PADS: 6.0000 | MEDICATED_PAD | Freq: Every day | CUTANEOUS | Status: DC

## 2021-12-20 MED ORDER — ALBUTEROL SULFATE (2.5 MG/3ML) 0.083% IN NEBU
5.0000 mg | INHALATION_SOLUTION | Freq: Once | RESPIRATORY_TRACT | Status: AC
  Administered 2021-12-20: 5 mg via RESPIRATORY_TRACT
  Filled 2021-12-20: qty 6

## 2021-12-20 MED ORDER — IPRATROPIUM BROMIDE 0.02 % IN SOLN
0.5000 mg | Freq: Once | RESPIRATORY_TRACT | Status: AC
  Administered 2021-12-20: 0.5 mg via RESPIRATORY_TRACT
  Filled 2021-12-20: qty 2.5

## 2021-12-20 NOTE — ED Triage Notes (Signed)
Pt reported to ED with c/o needing dialysis stating that he was unable to get to facility. Endorses being M-W-F treatment and last treatment was Monday.

## 2021-12-20 NOTE — Progress Notes (Signed)
Asked to see this pt for dialysis. Has missed 2 sessions. Hx recurrent GIB, HCC sp Y90 treatment on 11/28/21. In ED CXR shows congestion/ early edema, creat 8.9.  Pt not in distress. Plan HD upstairs on 2nd shift today. No plan for admission, will return to ED after dialysis for reassessment.    OP HD: SW MWF  4h  2/2 bath   450/500  Hep none  LUE AVF   Kelly Splinter, MD 12/20/2021, 8:24 AM

## 2021-12-20 NOTE — Progress Notes (Signed)
Received patient in bed to unit.  Alert and oriented.  Informed consent signed and in chart.   Treatment initiated: 1020 Treatment completed: 1356  Patient tolerated well.  Transported back to the room  Alert, without acute distress.  Hand-off given to patient's nurse.   Access used: fistula Access issues: none  Total UF removed: 3500 Medication(s) given: none Post HD VS: 97.9, 187/72(95), HR-85, RR-21,SP02-96 Post HD weight: unable to get. PT on stretcher and    [facility]

## 2021-12-20 NOTE — ED Provider Notes (Signed)
[facility] EMERGENCY DEPARTMENT Provider Note   CSN: [id number] Arrival date & time: 12/20/21  0534     History {Add pertinent medical, surgical, social history, OB history to HPI:1} Chief Complaint  Patient presents with   Shortness of Breath    KIET GEER is a 61 y.o. male.  Patient w hx esrd/hd mwf c/o missed dialysis today and Wednesday and feeling sob and generally tired. States missed due to transportation issues. Denies chest pain or discomfort. Denies new, worsening or increased cough. No sore throat or other uri symptoms. No fever or chills. No abd pain or nvd. Denies black stools or rectal bleeding.   The history is provided by the patient and medical records.  Shortness of Breath Associated symptoms: no abdominal pain, no chest pain, no cough, no diaphoresis, no fever, no headaches, no neck pain, no rash and no sore throat        Home Medications Prior to Admission medications   Medication Sig Start Date End Date Taking? Authorizing Provider  amLODipine (NORVASC) 5 MG tablet Take 1 tablet (5 mg total) by mouth daily. 09/18/21 12/17/21  [place] Donnamarie Poag, DO  atorvastatin (LIPITOR) 40 MG tablet Take 1 tablet (40 mg total) by mouth daily. 09/18/21 12/17/21  [place] Donnamarie Poag, DO  folic acid (FOLVITE) 1 MG tablet Take 1 tablet (1 mg total) by mouth daily. 12/12/21   Eugenie Filler, MD  gabapentin (NEURONTIN) 300 MG capsule Take 1 capsule (300 mg total) by mouth at bedtime. 11/15/21   Nita Sells, MD  Multiple Vitamin (MULTIVITAMIN WITH MINERALS) TABS tablet Take 1 tablet by mouth daily. 12/12/21   Eugenie Filler, MD  pantoprazole (PROTONIX) 40 MG tablet Take 1 tablet (40 mg total) by mouth 2 (two) times daily before a meal. 10/10/21 01/08/22  Mercy Riding, MD  thiamine (VITAMIN B1) 100 MG tablet Take 1 tablet (100 mg total) by mouth daily. 12/12/21   Eugenie Filler, MD  traZODone (DESYREL) 50 MG tablet Take 1 tablet (50 mg total) by mouth  at bedtime as needed for sleep. 11/15/21   Nita Sells, MD  colchicine 0.6 MG tablet Take 0.5 tablets (0.3 mg total) by mouth 2 (two) times daily. 07/24/20 07/29/20  Noemi Chapel, MD  furosemide (LASIX) 40 MG tablet Take 1 tablet (40 mg total) by mouth daily. 07/25/19 02/09/20  Ladona Horns, MD      Allergies    Seroquel [quetiapine] and Dilaudid [hydromorphone hcl]    Review of Systems   Review of Systems  Constitutional:  Negative for chills, diaphoresis and fever.  HENT:  Negative for sore throat.   Eyes:  Negative for redness.  Respiratory:  Positive for shortness of breath. Negative for cough.   Cardiovascular:  Positive for leg swelling. Negative for chest pain.       Bil leg (right leg and left stump) swelling  Gastrointestinal:  Negative for abdominal pain and blood in stool.  Genitourinary:  Negative for flank pain.  Musculoskeletal:  Negative for back pain and neck pain.  Skin:  Negative for rash.  Neurological:  Negative for light-headedness and headaches.  Hematological:  Does not bruise/bleed easily.  Psychiatric/Behavioral:  Negative for confusion.     Physical Exam Updated Vital Signs BP (!) 166/93 (BP Location: Right Arm)   Pulse 85   Temp 98.1 F (36.7 C) (Oral)   Resp 18   SpO2 96%  Physical Exam Vitals and nursing note reviewed.  Constitutional:  Appearance: Normal appearance. He is well-developed.  HENT:     Head: Atraumatic.     Nose: Nose normal.     Mouth/Throat:     Mouth: Mucous membranes are moist.  Eyes:     General: No scleral icterus.    Conjunctiva/sclera: Conjunctivae normal.  Neck:     Trachea: No tracheal deviation.  Cardiovascular:     Rate and Rhythm: Normal rate and regular rhythm.     Pulses: Normal pulses.     Heart sounds: Normal heart sounds. No murmur heard.    No friction rub. No gallop.  Pulmonary:     Effort: Pulmonary effort is normal. No accessory muscle usage or respiratory distress.     Breath sounds:  Wheezing present.     Comments: Mild wheezing bil.  Abdominal:     General: There is no distension.     Palpations: Abdomen is soft.     Tenderness: There is no abdominal tenderness.  Genitourinary:    Comments: No cva tenderness. Musculoskeletal:     Cervical back: Normal range of motion and neck supple. No rigidity.     Right lower leg: Edema present.     Left lower leg: Edema present.     Comments: Right foot/ankle/leg and left bka swelling. No cellulitis or sign of infection.  Skin:    General: Skin is warm and dry.     Findings: No rash.  Neurological:     Mental Status: He is alert.     Comments: Alert, speech clear.   Psychiatric:        Mood and Affect: Mood normal.     ED Results / Procedures / Treatments   Labs (all labs ordered are listed, but only abnormal results are displayed) Results for orders placed or performed during the hospital encounter of 12/20/21  Resp Panel by RT-PCR (Flu A&B, Covid) Anterior Nasal Swab   Specimen: Anterior Nasal Swab  Result Value Ref Range   SARS Coronavirus 2 by RT PCR NEGATIVE NEGATIVE   Influenza A by PCR NEGATIVE NEGATIVE   Influenza B by PCR NEGATIVE NEGATIVE  CBC  Result Value Ref Range   WBC 3.2 (L) 4.0 - 10.5 K/uL   RBC 2.52 (L) 4.22 - 5.81 MIL/uL   Hemoglobin 8.2 (L) 13.0 - 17.0 g/dL   HCT 23.7 (L) 39.0 - 52.0 %   MCV 94.0 80.0 - 100.0 fL   MCH 32.5 26.0 - 34.0 pg   MCHC 34.6 30.0 - 36.0 g/dL   RDW 17.6 (H) 11.5 - 15.5 %   Platelets 92 (L) 150 - 400 K/uL   nRBC 0.0 0.0 - 0.2 %  Comprehensive metabolic panel  Result Value Ref Range   Sodium 128 (L) 135 - 145 mmol/L   Potassium 3.9 3.5 - 5.1 mmol/L   Chloride 91 (L) 98 - 111 mmol/L   CO2 21 (L) 22 - 32 mmol/L   Glucose, Bld 86 70 - 99 mg/dL   BUN 38 (H) 8 - 23 mg/dL   Creatinine, Ser 8.94 (H) 0.61 - 1.24 mg/dL   Calcium 8.6 (L) 8.9 - 10.3 mg/dL   Total Protein 7.8 6.5 - 8.1 g/dL   Albumin 2.3 (L) 3.5 - 5.0 g/dL   AST 48 (H) 15 - 41 U/L   ALT 15 0 - 44 U/L    Alkaline Phosphatase 108 38 - 126 U/L   Total Bilirubin 1.3 (H) 0.3 - 1.2 mg/dL   GFR, Estimated 6 (L) >60 mL/min  Anion gap 16 (H) 5 - 15     ED ECG REPORT   Date: 12/20/2021  Rate: 86  Rhythm: normal sinus rhythm  QRS Axis: normal  Intervals: normal  ST/T Wave abnormalities: nonspecific ST/T changes  Conduction Disutrbances:none  Narrative Interpretation:   Old EKG Reviewed: unchanged  I have personally reviewed the EKG tracing   Radiology DG Chest Port 1 View  Result Date: 12/20/2021 CLINICAL DATA:  61 year old male with history of shortness of breath. EXAM: PORTABLE CHEST 1 VIEW COMPARISON:  Chest x-ray 12/10/2021. FINDINGS: Patchy multifocal interstitial and airspace disease is noted throughout the mid to lower lungs bilaterally, most severe in the perihilar aspects of both lungs, slightly worsened compared to the prior study. Small bilateral pleural effusions (right greater than left). No pneumothorax. No definite pulmonary edema. Heart size is normal. Upper mediastinal contours are within normal limits. Atherosclerotic calcifications are noted in the thoracic aorta. IMPRESSION: 1. The appearance of the chest is concerning for multilobar bilateral pneumonia, slightly worsened compared to the prior study. 2. Small bilateral pleural effusions (right greater than left). 3. Aortic atherosclerosis. Electronically Signed   By: Vinnie Langton M.D.   On: 12/20/2021 06:16    Procedures Procedures  {Document cardiac monitor, telemetry assessment procedure when appropriate:1}  Medications Ordered in ED Medications - No data to display  ED Course/ Medical Decision Making/ A&P                           Medical Decision Making Amount and/or Complexity of Data Reviewed Radiology: ordered.   Iv ns. Continuous pulse ox and cardiac monitoring. Labs ordered/sent. Imaging ordered.   Reviewed nursing notes and prior charts for additional history. External reports reviewed.   Cardiac  monitor: sinus rhythm, rate 85.  Labs reviewed/interpreted by me - k normal. Hgb c/w prior.   Xrays reviewed/interpreted by me - vascular congestion, bil eff.  Note, pt denies fever/chills/sweats, no increased cough/not coughing in ED, and suspect imaging findings and overall clinical picture not c/w acute bact pna.   Albuterol tx.   Nephrology consulted. Discussed pt with Dr Jonnie Finner - will arrange dialysis.   Recheck, no increased wob.      {Document critical care time when appropriate:1} {Document review of labs and clinical decision tools ie heart score, Chads2Vasc2 etc:1}  {Document your independent review of radiology images, and any outside records:1} {Document your discussion with family members, caretakers, and with consultants:1} {Document social determinants of health affecting pt's care:1} {Document your decision making why or why not admission, treatments were needed:1} Final Clinical Impression(s) / ED Diagnoses Final diagnoses:  None    Rx / DC Orders ED Discharge Orders     None

## 2021-12-20 NOTE — ED Notes (Signed)
Per report from dialysis, pt came in shob d/t missing dialysis Wed and Today. Pt is a MWF dialysis pt. Pt is A&Ox4, on RA, has L BKA. 3.5L of fluid pulled off of pt per dialysis staff.

## 2021-12-20 NOTE — ED Provider Notes (Signed)
  Physical Exam  BP (!) 187/72 (BP Location: Right Arm)   Pulse 85   Temp 97.9 F (36.6 C) (Oral)   Resp (!) 21   SpO2 96%   Physical Exam  Procedures  Procedures  ED Course / MDM    Medical Decision Making Amount and/or Complexity of Data Reviewed Radiology: ordered.  Risk Prescription drug management.   Patient returns to the ER after dialysis.  Feeling better.  Appears stable for discharge home.  Instructed on need for outpatient dialysis but patient states he believes this is the better place       Davonna Belling, MD 12/20/21 [phone]

## 2021-12-20 NOTE — Discharge Instructions (Signed)
It was our pleasure to provide your ER care today - we hope that you feel better.  Make sure to follow up at your next dialysis, and not to miss or skip dialysis.   Your blood pressure is high, your platelet count is low - follow up with primary care doctor in the coming week.  Also discuss plan regarding your liver lesions/mass  then.   Return to ER right away if worse, new symptoms, fevers, chest pain, trouble breathing, new/worsening cough, severe abdominal pain, persistent vomiting, or other concern.

## 2021-12-20 NOTE — Procedures (Addendum)
Did well w/ HD, 3.5 L approx removed and pt will return back to ED for reassessment.   I was present at this dialysis session, have reviewed the session itself and made  appropriate changes Kelly Splinter MD [place] pager [phone]   12/20/2021, 2:29 PM

## 2021-12-20 NOTE — ED Notes (Signed)
HD consent signed

## 2021-12-24 ENCOUNTER — Emergency Department (HOSPITAL_COMMUNITY)
Admission: EM | Admit: 2021-12-24 | Discharge: 2021-12-24 | Discharge status: Against Medical Advice | Payer: Medicare Other

## 2021-12-24 ENCOUNTER — Encounter: Payer: Self-pay | Admitting: *Deleted

## 2021-12-24 ENCOUNTER — Ambulatory Visit: Payer: Self-pay | Admitting: *Deleted

## 2021-12-24 NOTE — Patient Outreach (Unsigned)
[facility]) Care Management Geriatric Nurse Practitioner Note   12/25/2021 Name:  Daniel [name] MRN:  [id number] DOB:  [birth year]  Summary: Pt has had several ED visits needing dialysis Have to reapply for [facility] - redone today Has new L leg prosthetic for a couple of days but pt feels leg length            discrepancy L longer than R Needs PT to help him learn how to use the prosthetic leg safetly. Needs meds. Amlodipine, atorvastatin, trazadone ( can we increase to '100mg'$ ), protonix, folic acid, MVI with iron, and thiamine. NO appetite but has food.  Recommendations/Changes made from today's visit: Will advise inpt staff of need for medications. PT and prosthetic evaluation.  Subjective: Daniel [name] is an 61 y.o. year old male who is a primary patient of [name], NP. The care management team was consulted for assistance with care management and/or care coordination needs.    Geriatric Nurse Practitioner completed Home Visit today.  Objective: BP (!) 180/80   Pulse 91   Resp 16   Wt 240 lb (108.9 kg)   SpO2 98%   BMI 33.47 kg/m  RRR Lungs are clear RLE with 3-4+ edema and LLE/BKA also with 3+ edema.  Medications Reviewed Today     Reviewed by Bonnita Hollow, CPhT (Pharmacy Technician) on 12/25/21 at [phone]  Med List Status: Complete   Medication Order Taking? Sig Documenting Provider Last Dose Status Informant  amLODipine (NORVASC) 5 MG tablet [id number] No Take 1 tablet (5 mg total) by mouth daily.  Patient not taking: Reported on 12/25/2021   [place] Eric J, DO Not Taking Active Self           Med Note (WHITE, Caroline More   Wed Dec 25, 2021  5:42 AM)    atorvastatin (LIPITOR) 40 MG tablet [id number] No Take 1 tablet (40 mg total) by mouth daily.  Patient not taking: Reported on 12/25/2021   [place] Eric J, DO Not Taking Active Self           Med Note (WHITE, Caroline More   Wed Dec 25, 3901  0:09 AM)    folic acid (FOLVITE) 1 MG tablet  [id number] No Take 1 tablet (1 mg total) by mouth daily.  Patient not taking: Reported on 12/24/2021   Eugenie Filler, MD Not Taking Active Self           Med Note [address] [facility], Roxy Cedar Dec 25, 2021  5:43 AM)    gabapentin (NEURONTIN) 300 MG capsule [id number] No Take 1 capsule (300 mg total) by mouth at bedtime.  Patient taking differently: Take 300 mg by mouth at bedtime as needed (for nerve pain).   Nita Sells, MD 12/23/2021 Active Self  Multiple Vitamin (MULTIVITAMIN WITH MINERALS) TABS tablet [id number] No Take 1 tablet by mouth daily.  Patient not taking: Reported on 12/24/2021   Eugenie Filler, MD Not Taking Active Self  pantoprazole (PROTONIX) 40 MG tablet [id number] No Take 1 tablet (40 mg total) by mouth 2 (two) times daily before a meal.  Patient not taking: Reported on 12/25/2021   Mercy Riding, MD Not Taking Active Self  thiamine (VITAMIN B1) 100 MG tablet [id number] No Take 1 tablet (100 mg total) by mouth daily.  Patient not taking: Reported on 12/25/2021   Eugenie Filler, MD Not Taking Active Self  traZODone (DESYREL) 50 MG tablet [id number] No Take 1 tablet (50  mg total) by mouth at bedtime as needed for sleep.  Patient not taking: Reported on 12/24/2021   Nita Sells, MD Not Taking Active Self           Med Note [facility], Jasie Meleski   Tue Dec 24, 2021  3:10 PM) Out.             SDOH:  (Social Determinants of Health) assessments and interventions performed:  SDOH Interventions    Flowsheet Row Most Recent Value  SDOH Interventions   Food Insecurity Interventions Intervention Not Indicated  Transportation Interventions Other (Comment)  [Provided $40 for pt to get gas and meds.]      Care Plan  Review of patient past medical history, allergies, medications, health status, including review of consultants reports, laboratory and other test data, was performed as part of comprehensive evaluation for care management services.    Goals Addressed                This Visit's Progress     Patient Stated     Switch to dialysis center closer to his home to save gas. (pt-stated)   Not on track     Care Coordination Interventions: Provided pt $40 for gas and medications so he can go to dialysis tomorrow on his regular schedule and get his medications before Friday when he gets his check. Did not verify current dailysis center location. He was to switch to a nearer location when a space became available.       Other     Improve self management of chronic illnesses   Not on track     Care Coordination Interventions: Evaluation of current treatment plan related to dialysis and patient's adherence to plan as established by provider Advised patient to stick to current M-W-F dialysis schedule. Reviewed medications with patient and discussed Found pt did not have several medications: Amlodipine, Thiamine, Folic Acid. Discussed plans with patient for ongoing care management follow up and provided patient with direct contact information for care management team Screening for signs and symptoms of depression related to chronic disease state          Plan:  The care management team will reach out to the patient again over the next 1 week  days. The patient will call MD, NP for any sxs prior to going to the ED as advised to .[name]   [name], MSN, [facility] Gerontological Nurse Practitioner [facility] Care Management [phone]

## 2021-12-24 NOTE — ED Notes (Signed)
Pt states that they are leaving, wheeled out by security

## 2021-12-25 ENCOUNTER — Inpatient Hospital Stay (HOSPITAL_COMMUNITY)
Admission: EM | Admit: 2021-12-25 | Discharge: 2021-12-27 | DRG: 291 | Discharge status: Against Medical Advice | Payer: Medicare Other | Attending: Family Medicine | Admitting: Family Medicine

## 2021-12-25 ENCOUNTER — Encounter (HOSPITAL_COMMUNITY): Payer: Self-pay | Admitting: Emergency Medicine

## 2021-12-25 ENCOUNTER — Telehealth (INDEPENDENT_AMBULATORY_CARE_PROVIDER_SITE_OTHER): Payer: Self-pay

## 2021-12-25 ENCOUNTER — Emergency Department (HOSPITAL_COMMUNITY): Payer: Medicare Other

## 2021-12-25 ENCOUNTER — Observation Stay (HOSPITAL_COMMUNITY): Payer: Medicare Other

## 2021-12-25 ENCOUNTER — Other Ambulatory Visit: Payer: Self-pay

## 2021-12-25 DIAGNOSIS — E8770 Fluid overload, unspecified: Secondary | ICD-10-CM | POA: Diagnosis present

## 2021-12-25 DIAGNOSIS — Z888 Allergy status to other drugs, medicaments and biological substances status: Secondary | ICD-10-CM

## 2021-12-25 DIAGNOSIS — Z79899 Other long term (current) drug therapy: Secondary | ICD-10-CM

## 2021-12-25 DIAGNOSIS — N186 End stage renal disease: Secondary | ICD-10-CM | POA: Diagnosis present

## 2021-12-25 DIAGNOSIS — N2581 Secondary hyperparathyroidism of renal origin: Secondary | ICD-10-CM | POA: Diagnosis present

## 2021-12-25 DIAGNOSIS — Z599 Problem related to housing and economic circumstances, unspecified: Secondary | ICD-10-CM

## 2021-12-25 DIAGNOSIS — E876 Hypokalemia: Secondary | ICD-10-CM | POA: Diagnosis present

## 2021-12-25 DIAGNOSIS — M898X9 Other specified disorders of bone, unspecified site: Secondary | ICD-10-CM | POA: Diagnosis present

## 2021-12-25 DIAGNOSIS — D61818 Other pancytopenia: Secondary | ICD-10-CM | POA: Diagnosis present

## 2021-12-25 DIAGNOSIS — C220 Liver cell carcinoma: Secondary | ICD-10-CM | POA: Diagnosis present

## 2021-12-25 DIAGNOSIS — E1122 Type 2 diabetes mellitus with diabetic chronic kidney disease: Secondary | ICD-10-CM | POA: Diagnosis present

## 2021-12-25 DIAGNOSIS — D6959 Other secondary thrombocytopenia: Secondary | ICD-10-CM | POA: Diagnosis present

## 2021-12-25 DIAGNOSIS — E871 Hypo-osmolality and hyponatremia: Secondary | ICD-10-CM | POA: Diagnosis present

## 2021-12-25 DIAGNOSIS — I509 Heart failure, unspecified: Secondary | ICD-10-CM

## 2021-12-25 DIAGNOSIS — B182 Chronic viral hepatitis C: Secondary | ICD-10-CM | POA: Diagnosis present

## 2021-12-25 DIAGNOSIS — Z992 Dependence on renal dialysis: Secondary | ICD-10-CM

## 2021-12-25 DIAGNOSIS — I272 Pulmonary hypertension, unspecified: Secondary | ICD-10-CM | POA: Diagnosis present

## 2021-12-25 DIAGNOSIS — J189 Pneumonia, unspecified organism: Secondary | ICD-10-CM | POA: Diagnosis present

## 2021-12-25 DIAGNOSIS — R0602 Shortness of breath: Secondary | ICD-10-CM

## 2021-12-25 DIAGNOSIS — I5033 Acute on chronic diastolic (congestive) heart failure: Secondary | ICD-10-CM | POA: Diagnosis not present

## 2021-12-25 DIAGNOSIS — D631 Anemia in chronic kidney disease: Secondary | ICD-10-CM | POA: Diagnosis present

## 2021-12-25 DIAGNOSIS — Z89512 Acquired absence of left leg below knee: Secondary | ICD-10-CM

## 2021-12-25 DIAGNOSIS — I132 Hypertensive heart and chronic kidney disease with heart failure and with stage 5 chronic kidney disease, or end stage renal disease: Principal | ICD-10-CM | POA: Diagnosis present

## 2021-12-25 DIAGNOSIS — Z885 Allergy status to narcotic agent status: Secondary | ICD-10-CM

## 2021-12-25 DIAGNOSIS — D509 Iron deficiency anemia, unspecified: Secondary | ICD-10-CM | POA: Diagnosis present

## 2021-12-25 DIAGNOSIS — K7460 Unspecified cirrhosis of liver: Secondary | ICD-10-CM | POA: Diagnosis present

## 2021-12-25 DIAGNOSIS — Z5982 Transportation insecurity: Secondary | ICD-10-CM

## 2021-12-25 DIAGNOSIS — E1151 Type 2 diabetes mellitus with diabetic peripheral angiopathy without gangrene: Secondary | ICD-10-CM | POA: Diagnosis present

## 2021-12-25 DIAGNOSIS — Z8701 Personal history of pneumonia (recurrent): Secondary | ICD-10-CM

## 2021-12-25 DIAGNOSIS — Z8673 Personal history of transient ischemic attack (TIA), and cerebral infarction without residual deficits: Secondary | ICD-10-CM

## 2021-12-25 DIAGNOSIS — J9811 Atelectasis: Secondary | ICD-10-CM | POA: Diagnosis present

## 2021-12-25 DIAGNOSIS — F17210 Nicotine dependence, cigarettes, uncomplicated: Secondary | ICD-10-CM | POA: Diagnosis present

## 2021-12-25 DIAGNOSIS — Z5329 Procedure and treatment not carried out because of patient's decision for other reasons: Secondary | ICD-10-CM | POA: Diagnosis present

## 2021-12-25 LAB — CBC WITH DIFFERENTIAL/PLATELET
Abs Immature Granulocytes: 0.01 10*3/uL (ref 0.00–0.07)
Abs Immature Granulocytes: 0.02 10*3/uL (ref 0.00–0.07)
Basophils Absolute: 0 10*3/uL (ref 0.0–0.1)
Basophils Absolute: 0 10*3/uL (ref 0.0–0.1)
Basophils Relative: 1 %
Basophils Relative: 1 %
Eosinophils Absolute: 0.1 10*3/uL (ref 0.0–0.5)
Eosinophils Absolute: 0 10*3/uL (ref 0.0–0.5)
Eosinophils Relative: 2 %
Eosinophils Relative: 1 %
HCT: 25.2 % — ABNORMAL LOW (ref 39.0–52.0)
HCT: 25.6 % — ABNORMAL LOW (ref 39.0–52.0)
Hemoglobin: 8.7 g/dL — ABNORMAL LOW (ref 13.0–17.0)
Hemoglobin: 8.7 g/dL — ABNORMAL LOW (ref 13.0–17.0)
Immature Granulocytes: 0 %
Immature Granulocytes: 1 %
Lymphocytes Relative: 17 %
Lymphocytes Relative: 22 %
Lymphs Abs: 0.5 10*3/uL — ABNORMAL LOW (ref 0.7–4.0)
Lymphs Abs: 0.6 10*3/uL — ABNORMAL LOW (ref 0.7–4.0)
MCH: 32.5 pg (ref 26.0–34.0)
MCH: 32.8 pg (ref 26.0–34.0)
MCHC: 34.5 g/dL (ref 30.0–36.0)
MCHC: 34 g/dL (ref 30.0–36.0)
MCV: 94 fL (ref 80.0–100.0)
MCV: 96.6 fL (ref 80.0–100.0)
Monocytes Absolute: 0.5 10*3/uL (ref 0.1–1.0)
Monocytes Absolute: 0.6 10*3/uL (ref 0.1–1.0)
Monocytes Relative: 17 %
Monocytes Relative: 23 %
Neutro Abs: 1.7 10*3/uL (ref 1.7–7.7)
Neutro Abs: 1.5 10*3/uL — ABNORMAL LOW (ref 1.7–7.7)
Neutrophils Relative %: 63 %
Neutrophils Relative %: 52 %
Platelets: 115 10*3/uL — ABNORMAL LOW (ref 150–400)
Platelets: 115 10*3/uL — ABNORMAL LOW (ref 150–400)
RBC: 2.68 MIL/uL — ABNORMAL LOW (ref 4.22–5.81)
RBC: 2.65 MIL/uL — ABNORMAL LOW (ref 4.22–5.81)
RDW: 17.7 % — ABNORMAL HIGH (ref 11.5–15.5)
RDW: 18.1 % — ABNORMAL HIGH (ref 11.5–15.5)
WBC: 2.7 10*3/uL — ABNORMAL LOW (ref 4.0–10.5)
WBC: 2.8 10*3/uL — ABNORMAL LOW (ref 4.0–10.5)
nRBC: 0 % (ref 0.0–0.2)
nRBC: 0 % (ref 0.0–0.2)

## 2021-12-25 LAB — RENAL FUNCTION PANEL
Albumin: 2.3 g/dL — ABNORMAL LOW (ref 3.5–5.0)
Anion gap: 15 (ref 5–15)
BUN: 16 mg/dL (ref 8–23)
CO2: 24 mmol/L (ref 22–32)
Calcium: 8.6 mg/dL — ABNORMAL LOW (ref 8.9–10.3)
Chloride: 87 mmol/L — ABNORMAL LOW (ref 98–111)
Creatinine, Ser: 6.21 mg/dL — ABNORMAL HIGH (ref 0.61–1.24)
GFR, Estimated: 10 mL/min — ABNORMAL LOW (ref >60)
Glucose, Bld: 85 mg/dL (ref 70–99)
Phosphorus: 3.8 mg/dL (ref 2.5–4.6)
Potassium: 3.4 mmol/L — ABNORMAL LOW (ref 3.5–5.1)
Sodium: 126 mmol/L — ABNORMAL LOW (ref 135–145)

## 2021-12-25 LAB — COMPREHENSIVE METABOLIC PANEL
ALT: 14 U/L (ref 0–44)
AST: 49 U/L — ABNORMAL HIGH (ref 15–41)
Albumin: 2.3 g/dL — ABNORMAL LOW (ref 3.5–5.0)
Alkaline Phosphatase: 104 U/L (ref 38–126)
Anion gap: 14 (ref 5–15)
BUN: 15 mg/dL (ref 8–23)
CO2: 25 mmol/L (ref 22–32)
Calcium: 8.4 mg/dL — ABNORMAL LOW (ref 8.9–10.3)
Chloride: 88 mmol/L — ABNORMAL LOW (ref 98–111)
Creatinine, Ser: 5.96 mg/dL — ABNORMAL HIGH (ref 0.61–1.24)
GFR, Estimated: 10 mL/min — ABNORMAL LOW (ref >60)
Glucose, Bld: 80 mg/dL (ref 70–99)
Potassium: 3.2 mmol/L — ABNORMAL LOW (ref 3.5–5.1)
Sodium: 127 mmol/L — ABNORMAL LOW (ref 135–145)
Total Bilirubin: 1.4 mg/dL — ABNORMAL HIGH (ref 0.3–1.2)
Total Protein: 8 g/dL (ref 6.5–8.1)

## 2021-12-25 LAB — MAGNESIUM: Magnesium: 1.7 mg/dL (ref 1.7–2.4)

## 2021-12-25 LAB — PROCALCITONIN: Procalcitonin: 0.82 ng/mL

## 2021-12-25 LAB — TROPONIN I (HIGH SENSITIVITY)
Troponin I (High Sensitivity): 27 ng/L — ABNORMAL HIGH (ref <18)
Troponin I (High Sensitivity): 24 ng/L — ABNORMAL HIGH (ref <18)

## 2021-12-25 MED ORDER — SODIUM CHLORIDE 0.9% FLUSH
3.0000 mL | Freq: Two times a day (BID) | INTRAVENOUS | Status: DC
  Administered 2021-12-25 – 2021-12-26 (×4): 3 mL via INTRAVENOUS

## 2021-12-25 MED ORDER — FUROSEMIDE 10 MG/ML IJ SOLN
80.0000 mg | Freq: Once | INTRAMUSCULAR | Status: AC
Start: 2021-12-25 — End: 2021-12-25
  Administered 2021-12-25: 80 mg via INTRAVENOUS
  Filled 2021-12-25: qty 8

## 2021-12-25 MED ORDER — ALTEPLASE 2 MG IJ SOLR: 2.0000 mg | Freq: Once | INTRAMUSCULAR | Status: DC | PRN

## 2021-12-25 MED ORDER — ACETAMINOPHEN 325 MG PO TABS
650.0000 mg | ORAL_TABLET | Freq: Four times a day (QID) | ORAL | Status: DC | PRN
Start: 2021-12-25 — End: 2021-12-25

## 2021-12-25 MED ORDER — ANTICOAGULANT SODIUM CITRATE 4% (200MG/5ML) IV SOLN
5.0000 mL | Status: DC | PRN
  Filled 2021-12-25: qty 5

## 2021-12-25 MED ORDER — PROMETHAZINE HCL 25 MG/ML IJ SOLN
6.2500 mg | Freq: Four times a day (QID) | INTRAMUSCULAR | Status: DC | PRN
  Filled 2021-12-25: qty 0.25

## 2021-12-25 MED ORDER — SODIUM CHLORIDE 0.9 % IV SOLN
250.0000 mL | INTRAVENOUS | Status: DC | PRN
Start: 2021-12-25 — End: 2021-12-27

## 2021-12-25 MED ORDER — LIDOCAINE HCL (PF) 1 % IJ SOLN: 5.0000 mL | INTRAMUSCULAR | Status: DC | PRN

## 2021-12-25 MED ORDER — DOXYCYCLINE HYCLATE 100 MG PO TABS
100.0000 mg | ORAL_TABLET | Freq: Two times a day (BID) | ORAL | Status: DC
  Administered 2021-12-25 – 2021-12-26 (×4): 100 mg via ORAL
  Filled 2021-12-25 (×4): qty 1

## 2021-12-25 MED ORDER — CARVEDILOL 3.125 MG PO TABS
3.1250 mg | ORAL_TABLET | Freq: Two times a day (BID) | ORAL | Status: DC
  Administered 2021-12-25 – 2021-12-26 (×4): 3.125 mg via ORAL
  Filled 2021-12-25 (×4): qty 1

## 2021-12-25 MED ORDER — SODIUM CHLORIDE 0.9% FLUSH
3.0000 mL | INTRAVENOUS | Status: DC | PRN
Start: 2021-12-25 — End: 2021-12-27

## 2021-12-25 MED ORDER — IPRATROPIUM-ALBUTEROL 0.5-2.5 (3) MG/3ML IN SOLN
3.0000 mL | Freq: Four times a day (QID) | RESPIRATORY_TRACT | Status: DC
Start: 2021-12-25 — End: 2021-12-26
  Administered 2021-12-25 – 2021-12-26 (×4): 3 mL via RESPIRATORY_TRACT
  Filled 2021-12-25 (×4): qty 3

## 2021-12-25 MED ORDER — HEPARIN SODIUM (PORCINE) 1000 UNIT/ML DIALYSIS
1000.0000 [IU] | INTRAMUSCULAR | Status: DC | PRN
  Filled 2021-12-25: qty 1

## 2021-12-25 MED ORDER — IPRATROPIUM-ALBUTEROL 0.5-2.5 (3) MG/3ML IN SOLN
3.0000 mL | Freq: Once | RESPIRATORY_TRACT | Status: AC
  Administered 2021-12-25: 3 mL via RESPIRATORY_TRACT
  Filled 2021-12-25: qty 3

## 2021-12-25 MED ORDER — HYDRALAZINE HCL 25 MG PO TABS
25.0000 mg | ORAL_TABLET | Freq: Four times a day (QID) | ORAL | Status: DC | PRN
  Administered 2021-12-26 – 2021-12-27 (×2): 25 mg via ORAL
  Filled 2021-12-25 (×2): qty 1

## 2021-12-25 MED ORDER — ACETAMINOPHEN 650 MG RE SUPP
650.0000 mg | Freq: Four times a day (QID) | RECTAL | Status: DC | PRN
Start: 2021-12-25 — End: 2021-12-25

## 2021-12-25 MED ORDER — SODIUM CHLORIDE 0.9 % IV SOLN
1.0000 g | INTRAVENOUS | Status: DC
Start: 2021-12-25 — End: 2021-12-27
  Administered 2021-12-25 – 2021-12-26 (×2): 1 g via INTRAVENOUS
  Filled 2021-12-25 (×2): qty 10

## 2021-12-25 MED ORDER — CHLORHEXIDINE GLUCONATE CLOTH 2 % EX PADS
6.0000 | MEDICATED_PAD | Freq: Every day | CUTANEOUS | Status: DC
  Administered 2021-12-25: 6 via TOPICAL

## 2021-12-25 MED ORDER — LIDOCAINE-PRILOCAINE 2.5-2.5 % EX CREA: 1.0000 | TOPICAL_CREAM | CUTANEOUS | Status: DC | PRN

## 2021-12-25 MED ORDER — BENZONATATE 100 MG PO CAPS
100.0000 mg | ORAL_CAPSULE | Freq: Three times a day (TID) | ORAL | Status: DC | PRN
  Administered 2021-12-26 (×2): 100 mg via ORAL
  Filled 2021-12-25 (×2): qty 1

## 2021-12-25 MED ORDER — HYDROCODONE-ACETAMINOPHEN 5-325 MG PO TABS
1.0000 | ORAL_TABLET | Freq: Once | ORAL | Status: AC
  Administered 2021-12-25: 1 via ORAL
  Filled 2021-12-25: qty 1

## 2021-12-25 MED ORDER — ACETAMINOPHEN 325 MG PO TABS
650.0000 mg | ORAL_TABLET | ORAL | Status: DC | PRN
  Administered 2021-12-25 – 2021-12-26 (×3): 650 mg via ORAL
  Filled 2021-12-25 (×3): qty 2

## 2021-12-25 MED ORDER — GUAIFENESIN ER 600 MG PO TB12
1200.0000 mg | ORAL_TABLET | Freq: Two times a day (BID) | ORAL | Status: DC
  Administered 2021-12-25 – 2021-12-26 (×4): 1200 mg via ORAL
  Filled 2021-12-25 (×4): qty 2

## 2021-12-25 MED ORDER — HEPARIN SODIUM (PORCINE) 5000 UNIT/ML IJ SOLN
5000.0000 [IU] | Freq: Three times a day (TID) | INTRAMUSCULAR | Status: DC
  Administered 2021-12-25 – 2021-12-27 (×6): 5000 [IU] via SUBCUTANEOUS
  Filled 2021-12-25 (×5): qty 1

## 2021-12-25 MED ORDER — HYDROCOD POLI-CHLORPHE POLI ER 10-8 MG/5ML PO SUER
5.0000 mL | Freq: Two times a day (BID) | ORAL | Status: DC | PRN
  Administered 2021-12-25 – 2021-12-26 (×2): 5 mL via ORAL
  Filled 2021-12-25 (×2): qty 5

## 2021-12-25 MED ORDER — POTASSIUM CHLORIDE CRYS ER 20 MEQ PO TBCR
20.0000 meq | EXTENDED_RELEASE_TABLET | Freq: Once | ORAL | Status: AC
  Administered 2021-12-25: 20 meq via ORAL
  Filled 2021-12-25: qty 1

## 2021-12-25 MED ORDER — GABAPENTIN 300 MG PO CAPS: 300.0000 mg | ORAL_CAPSULE | Freq: Every evening | ORAL | Status: DC | PRN

## 2021-12-25 MED ORDER — PENTAFLUOROPROP-TETRAFLUOROETH EX AERO: 1.0000 | INHALATION_SPRAY | CUTANEOUS | Status: DC | PRN

## 2021-12-25 NOTE — ED Triage Notes (Signed)
Pt reports SOB, dialysis pt, (M,W, F) w/ the last treatment being this past Monday. Diminished breath sounds states it feels like he needs dialysis.  Reports that right foot is swollen.

## 2021-12-25 NOTE — Consult Note (Signed)
[facility] Renal Consultation Note    Indication for Consultation:  Management of ESRD/hemodialysis, anemia, hypertension/volume, and secondary hyperparathyroidism.  HPI: Daniel [name] is a 61 y.o. 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 Patient reports he has had progressive SOB, orthopnea, and edema over the past week. He did go to HD on 12/23/21 but has missed several treatments over the last few weeks and had an ED visit 12/20/21 for similar symptoms. He underwent HD and was discharged. He endorses feeling poorly overall lately. Reports poor appetite and fatigue. 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 He denies CP, palpitations, dizziness, fever, chills, abdominal pain, diarrhea, nausea and vomiting.   ED work up notable for K+ 3.4, Na 126, Ca 8.6, WBC 2.8, Hgb 8.7, Plt 115. CT chest showed multifocal airspace disease. BP has been elevated. O2 sat 94-98% on room air. Nephrology has been consulted for management of ESRD.   Past Medical History:  Diagnosis Date   Anemia    Diabetes mellitus without complication [facility])    patient denies   Dialysis patient [facility])    End stage chronic kidney disease ([place])    Hypertension    ICH (intracerebral hemorrhage) ([place]) 05/20/2017   PAD (peripheral artery disease) ([facility])    Shoulder pain, left 06/28/2013   Past Surgical History:  Procedure Laterality Date   A/V FISTULAGRAM N/A 08/15/2020   Procedure: A/V FISTULAGRAM - Left Upper;  Surgeon: Cherre Robins, MD;  Location: [place] [facility];  Service: Cardiovascular;  Laterality: N/A;   ABDOMINAL AORTOGRAM W/LOWER EXTREMITY N/A 04/08/2021   Procedure: ABDOMINAL AORTOGRAM W/LOWER EXTREMITY;  Surgeon: Waynetta Sandy, MD;  Location: [place] [facility];   Service: Cardiovascular;  Laterality: N/A;   AMPUTATION Left 04/16/2021   Procedure: LEFT BELOW KNEE AMPUTATION;  Surgeon: Angelia Mould, MD;  Location: [place];  Service: Vascular;  Laterality: Left;   AMPUTATION Left 06/17/2021   Procedure: REVISION AMPUTATION BELOW KNEE;  Surgeon: Broadus John, MD;  Location: [place];  Service: Vascular;  Laterality: Left;   APPENDECTOMY     APPLICATION OF WOUND VAC Left 06/17/2021   Procedure: APPLICATION OF WOUND VAC;  Surgeon: Broadus John, MD;  Location: [place];  Service: Vascular;  Laterality: Left;   AV FISTULA PLACEMENT Left 08/03/2019   Procedure: LEFT ARM ARTERIOVENOUS (AV) CEPHALIC  FISTULA CREATION;  Surgeon: Waynetta Sandy, MD;  Location: [place];  Service: Vascular;  Laterality: Left;   BIOPSY  06/30/2019   Procedure: BIOPSY;  Surgeon: Ronald Lobo, MD;  Location: [facility] ENDOSCOPY;  Service: Endoscopy;;   BIOPSY  08/02/2019   Procedure: BIOPSY;  Surgeon: Yetta Flock, MD;  Location: [facility] ENDOSCOPY;  Service: Gastroenterology;;   BIOPSY  02/08/2021   Procedure: BIOPSY;  Surgeon: Sharyn Creamer, MD;  Location: [facility] ENDOSCOPY;  Service: Gastroenterology;;   BIOPSY  02/24/2021   Procedure: BIOPSY;  Surgeon: [name], MD;  Location: [facility] ENDOSCOPY;  Service: Gastroenterology;;   BIOPSY  03/26/2021   Procedure: BIOPSY;  Surgeon: Lavena Bullion, DO;  Location: [place];  Service: Gastroenterology;;   BIOPSY  08/06/2021   Procedure: BIOPSY;  Surgeon: [name], MD;  Location: [place];  Service: Gastroenterology;;   BIOPSY  09/15/2021   Procedure: BIOPSY;  Surgeon: Sharyn Creamer, MD;  Location: [place];  Service: Gastroenterology;;   COLONOSCOPY  01/23/2012   Procedure: COLONOSCOPY;  Surgeon: Danie Binder, MD;  [facility];  Service: Endoscopy;  Laterality: N/A;  11:10 AM   COLONOSCOPY WITH PROPOFOL N/A 06/30/2019   Procedure: COLONOSCOPY WITH PROPOFOL;  Surgeon: Ronald Lobo, MD;   Location: [place];  Service: Endoscopy;  Laterality: N/A;   ENTEROSCOPY N/A 08/02/2019   Procedure: ENTEROSCOPY;  Surgeon: Yetta Flock, MD;  Location: [facility] ENDOSCOPY;  Service: Gastroenterology;  Laterality: N/A;   ENTEROSCOPY N/A 02/24/2021   Procedure: ENTEROSCOPY;  Surgeon: [name], MD;  Location: [facility] ENDOSCOPY;  Service: Gastroenterology;  Laterality: N/A;   ENTEROSCOPY N/A 03/26/2021   Procedure: ENTEROSCOPY;  Surgeon: Lavena Bullion, DO;  Location: [place];  Service: Gastroenterology;  Laterality: N/A;   ENTEROSCOPY N/A 07/05/2021   Procedure: ENTEROSCOPY;  Surgeon: Carol Ada, MD;  Location: [place];  Service: Gastroenterology;  Laterality: N/A;   ENTEROSCOPY N/A 08/06/2021   Procedure: ENTEROSCOPY;  Surgeon: [name], MD;  Location: [facility] ENDOSCOPY;  Service: Gastroenterology;  Laterality: N/A;   ENTEROSCOPY N/A 08/24/2021   Procedure: ENTEROSCOPY;  Surgeon: Ladene Artist, MD;  Location: [facility] ENDOSCOPY;  Service: Gastroenterology;  Laterality: N/A;   ENTEROSCOPY N/A 09/15/2021   Procedure: ENTEROSCOPY;  Surgeon: Sharyn Creamer, MD;  Location: [facility] ENDOSCOPY;  Service: Gastroenterology;  Laterality: N/A;   ENTEROSCOPY N/A 10/06/2021   Procedure: ENTEROSCOPY;  Surgeon: [name], MD;  Location: [facility] ENDOSCOPY;  Service: Gastroenterology;  Laterality: N/A;   ENTEROSCOPY N/A 11/14/2021   Procedure: ENTEROSCOPY;  Surgeon: Doran Stabler, MD;  Location: [facility] ENDOSCOPY;  Service: Gastroenterology;  Laterality: N/A;   ESOPHAGOGASTRODUODENOSCOPY N/A 08/10/2020   Procedure: ESOPHAGOGASTRODUODENOSCOPY (EGD);  Surgeon: Jerene Bears, MD;  Location: [facility] ENDOSCOPY;  Service: Gastroenterology;  Laterality: N/A;   ESOPHAGOGASTRODUODENOSCOPY (EGD) WITH PROPOFOL N/A 06/30/2019   Procedure: ESOPHAGOGASTRODUODENOSCOPY (EGD) WITH PROPOFOL;  Surgeon: Ronald Lobo, MD;  Location: [place];  Service: Endoscopy;  Laterality: N/A;   ESOPHAGOGASTRODUODENOSCOPY  (EGD) WITH PROPOFOL N/A 01/12/2021   Procedure: ESOPHAGOGASTRODUODENOSCOPY (EGD) WITH PROPOFOL;  Surgeon: Sharyn Creamer, MD;  Location: [place];  Service: Gastroenterology;  Laterality: N/A;   ESOPHAGOGASTRODUODENOSCOPY (EGD) WITH PROPOFOL N/A 02/08/2021   Procedure: ESOPHAGOGASTRODUODENOSCOPY (EGD) WITH PROPOFOL;  Surgeon: Sharyn Creamer, MD;  Location: [place];  Service: Gastroenterology;  Laterality: N/A;   FISTULA SUPERFICIALIZATION Left 10/17/2019   Procedure: LEFT UPPER EXTREMITY FISTULA REVISION, SIDE BRANCH LIGATION,  AND SUPERFICIALIZATION;  Surgeon: Marty Heck, MD;  Location: [place];  Service: Vascular;  Laterality: Left;   FISTULA SUPERFICIALIZATION Left 40/98/1191   Procedure: PLICATION OF ANEURYSM LEFT ARTERIOVENOUS FISTULA;  Surgeon: Angelia Mould, MD;  Location: [place];  Service: Vascular;  Laterality: Left;   FLEXIBLE SIGMOIDOSCOPY N/A 07/05/2021   Procedure: FLEXIBLE SIGMOIDOSCOPY;  Surgeon: Carol Ada, MD;  Location: [place];  Service: Gastroenterology;  Laterality: N/A;   GIVENS CAPSULE STUDY N/A 06/30/2019   Procedure: GIVENS CAPSULE STUDY;  Surgeon: Ronald Lobo, MD;  Location: [place];  Service: Endoscopy;  Laterality: N/A;   GIVENS CAPSULE STUDY N/A 08/25/2021   Procedure: GIVENS CAPSULE STUDY;  Surgeon: Ladene Artist, MD;  Location: [place];  Service: Gastroenterology;  Laterality: N/A;   GIVENS CAPSULE STUDY N/A 10/06/2021   Procedure: GIVENS CAPSULE STUDY;  Surgeon: [name], MD;  Location: [place];  Service: Gastroenterology;  Laterality: N/A;   HEMOSTASIS CLIP PLACEMENT  08/10/2020  Procedure: HEMOSTASIS CLIP PLACEMENT;  Surgeon: Jerene Bears, MD;  Location: [place];  Service: Gastroenterology;;   HEMOSTASIS CLIP PLACEMENT  01/12/2021   Procedure: HEMOSTASIS CLIP PLACEMENT;  Surgeon: Sharyn Creamer, MD;  Location: [place];  Service: Gastroenterology;;   HEMOSTASIS CONTROL  08/02/2019   Procedure:  HEMOSTASIS CONTROL;  Surgeon: Yetta Flock, MD;  Location: [place];  Service: Gastroenterology;;   HOT HEMOSTASIS  02/24/2021   Procedure: HOT HEMOSTASIS (ARGON PLASMA COAGULATION/BICAP);  Surgeon: [name], MD;  Location: [facility] ENDOSCOPY;  Service: Gastroenterology;;   HOT HEMOSTASIS N/A 03/26/2021   Procedure: HOT HEMOSTASIS (ARGON PLASMA COAGULATION/BICAP);  Surgeon: Lavena Bullion, DO;  Location: [facility] ENDOSCOPY;  Service: Gastroenterology;  Laterality: N/A;   HOT HEMOSTASIS N/A 07/05/2021   Procedure: HOT HEMOSTASIS (ARGON PLASMA COAGULATION/BICAP);  Surgeon: Carol Ada, MD;  Location: [place];  Service: Gastroenterology;  Laterality: N/A;   HOT HEMOSTASIS N/A 09/15/2021   Procedure: HOT HEMOSTASIS (ARGON PLASMA COAGULATION/BICAP);  Surgeon: Sharyn Creamer, MD;  Location: [place];  Service: Gastroenterology;  Laterality: N/A;   HOT HEMOSTASIS N/A 10/06/2021   Procedure: HOT HEMOSTASIS (ARGON PLASMA COAGULATION/BICAP);  Surgeon: [name], MD;  Location: [place];  Service: Gastroenterology;  Laterality: N/A;   INCISION AND DRAINAGE ABSCESS N/A 06/29/2016   Procedure: INCISION AND DRAINAGE ABDOMINAL WALL ABSCESS;  Surgeon: Alphonsa Overall, MD;  Location: [facility];  Service: General;  Laterality: N/A;   INSERTION OF DIALYSIS CATHETER Right 08/03/2019   Procedure: INSERTION OF DIALYSIS CATHETER;  Surgeon: Waynetta Sandy, MD;  Location: [place];  Service: Vascular;  Laterality: Right;   INSERTION OF DIALYSIS CATHETER Right 10/22/2019   Procedure: INSERTION OF 23CM TUNNELED DIALYSIS CATHETER RIGHT INTERNAL JUGULAR;  Surgeon: Angelia Mould, MD;  Location: [place];  Service: Vascular;  Laterality: Right;   INSERTION OF DIALYSIS CATHETER Right 08/12/2020   Procedure: INSERTION OF Right internal Jugular TUNNELED  DIALYSIS CATHETER.;  Surgeon: Waynetta Sandy, MD;  Location: [place];  Service: Vascular;  Laterality: Right;   INSERTION OF  DIALYSIS CATHETER N/A 04/19/2021   Procedure: INSERTION OF TUNNELED DIALYSIS CATHETER;  Surgeon: Angelia Mould, MD;  Location[facility];  Service: Vascular;  Laterality: N/A;   IR 3D INDEPENDENT WKST  11/07/2021   IR ANGIOGRAM SELECTIVE EACH ADDITIONAL VESSEL  11/07/2021   IR ANGIOGRAM SELECTIVE EACH ADDITIONAL VESSEL  11/07/2021   IR ANGIOGRAM SELECTIVE EACH ADDITIONAL VESSEL  11/07/2021   IR ANGIOGRAM SELECTIVE EACH ADDITIONAL VESSEL  11/28/2021   IR ANGIOGRAM VISCERAL SELECTIVE  11/07/2021   IR ANGIOGRAM VISCERAL SELECTIVE  11/28/2021   IR EMBO ARTERIAL NOT HEMORR HEMANG INC GUIDE ROADMAPPING  11/07/2021   IR EMBO TUMOR ORGAN ISCHEMIA INFARCT INC GUIDE ROADMAPPING  11/28/2021   IR RADIOLOGIST EVAL & MGMT  09/12/2021   IR US GUIDE VASC ACCESS LEFT  11/07/2021   IR US GUIDE VASC ACCESS LEFT  11/28/2021   Left heel surgery     LOWER EXTREMITY ANGIOGRAPHY N/A 07/08/2021   Procedure: Lower Extremity Angiography;  Surgeon: Cherre Robins, MD;  Location: [place] [facility];  Service: Cardiovascular;  Laterality: N/A;   PENILE BIOPSY N/A 03/26/2020   Procedure: PENILE ULCER DEBRIDEMENT;  Surgeon: Remi Haggard, MD;  Location[facility];  Service: Urology;  Laterality: N/A;  30 MINS   PERIPHERAL VASCULAR INTERVENTION Right 07/08/2021   Procedure: PERIPHERAL VASCULAR INTERVENTION;  Surgeon: Cherre Robins, MD;  Location: [place] [facility];  Service: Cardiovascular;  Laterality:  Right;   SCLEROTHERAPY  01/12/2021   Procedure: SCLEROTHERAPY;  Surgeon: Sharyn Creamer, MD;  Location: [facility] ENDOSCOPY;  Service: Gastroenterology;;   Clide Deutscher  02/24/2021   Procedure: Clide Deutscher;  Surgeon: [name], MD;  Location: [place];  Service: Gastroenterology;;   SUBMUCOSAL TATTOO INJECTION  08/24/2021   Procedure: SUBMUCOSAL TATTOO INJECTION;  Surgeon: Ladene Artist, MD;  Location: [place];  Service: Gastroenterology;;   SUBMUCOSAL TATTOO INJECTION  09/15/2021   Procedure: SUBMUCOSAL TATTOO  INJECTION;  Surgeon: Sharyn Creamer, MD;  Location: [facility] ENDOSCOPY;  Service: Gastroenterology;;   Family History  Problem Relation Age of Onset   Colon cancer Neg Hx    Social History:  reports that he has been smoking cigarettes. He has a 22.50 pack-year smoking history. He has never used smokeless tobacco. He reports that he does not currently use alcohol. He reports that he does not currently use drugs after having used the following drugs: "Crack" cocaine.  ROS: As per HPI otherwise negative.  Physical Exam: Vitals:   12/25/21 0400 12/25/21 0415 12/25/21 0515 12/25/21 0740  BP:  (!) 190/102 (!) 151/89 (!) 169/101  Pulse: 90 92 94 82  Resp: 14 (!) '21 17 16  '$ Temp:  98.7 F (37.1 C)    TempSrc:  Oral    SpO2: 96% 91% 95% 94%     General: Well developed, well nourished, in no acute distress. Head: mucus membranes are moist. Lungs: Clear bilaterally to auscultation without wheezes, rales, or rhonchi. Breathing is unlabored. Heart: RRR with normal S1, S2. No murmurs, rubs, or gallops appreciated. Abdomen: Soft, mildly distended with normoactive bowel sounds.  No obvious abdominal masses. Musculoskeletal:  Strength and tone appear normal for age. Lower extremities: 2+ pitting edema bilateral lower extremities Neuro: Alert and oriented X 3. Moves all extremities spontaneously. Psych:  Responds to questions appropriately with a normal affect. Dialysis Access: AVF + bruit  Allergies  Allergen Reactions   Seroquel [Quetiapine] Other (See Comments)    Tardive kinesia Dystonia    Dilaudid [Hydromorphone Hcl] Itching and Other (See Comments)    Pt reports itchiness after IM injection    Prior to Admission medications   Medication Sig Start Date End Date Taking? Authorizing Provider  amLODipine (NORVASC) 5 MG tablet Take 1 tablet (5 mg total) by mouth daily. Patient not taking: Reported on 12/25/2021 09/18/21 12/26/22  [place] Eric J, DO  atorvastatin (LIPITOR) 40 MG tablet Take 1  tablet (40 mg total) by mouth daily. Patient not taking: Reported on 12/25/2021 09/18/21 12/26/22  [place] Donnamarie Poag, DO  folic acid (FOLVITE) 1 MG tablet Take 1 tablet (1 mg total) by mouth daily. Patient not taking: Reported on 12/24/2021 12/12/21   Eugenie Filler, MD  gabapentin (NEURONTIN) 300 MG capsule Take 1 capsule (300 mg total) by mouth at bedtime. Patient taking differently: Take 300 mg by mouth at bedtime as needed (for nerve pain). 11/15/21   Nita Sells, MD  Multiple Vitamin (MULTIVITAMIN WITH MINERALS) TABS tablet Take 1 tablet by mouth daily. Patient not taking: Reported on 12/24/2021 12/12/21   Eugenie Filler, MD  pantoprazole (PROTONIX) 40 MG tablet Take 1 tablet (40 mg total) by mouth 2 (two) times daily before a meal. Patient not taking: Reported on 12/25/2021 10/10/21 01/08/22  Mercy Riding, MD  thiamine (VITAMIN B1) 100 MG tablet Take 1 tablet (100 mg total) by mouth daily. Patient not taking: Reported on 12/25/2021 12/12/21   Eugenie Filler, MD  traZODone [facility])  50 MG tablet Take 1 tablet (50 mg total) by mouth at bedtime as needed for sleep. Patient not taking: Reported on 12/24/2021 11/15/21   Nita Sells, MD  colchicine 0.6 MG tablet Take 0.5 tablets (0.3 mg total) by mouth 2 (two) times daily. 07/24/20 07/29/20  Noemi Chapel, MD  furosemide (LASIX) 40 MG tablet Take 1 tablet (40 mg total) by mouth daily. 07/25/19 02/09/20  Ladona Horns, MD   Current Facility-Administered Medications  Medication Dose Route Frequency Provider Last Rate Last Admin   0.9 %  sodium chloride infusion  250 mL Intravenous PRN Lequita Halt, MD       acetaminophen (TYLENOL) tablet 650 mg  650 mg Oral Q4H PRN Wynetta Fines T, MD       carvedilol (COREG) tablet 3.125 mg  3.125 mg Oral BID WC Lequita Halt, MD       Chlorhexidine Gluconate Cloth 2 % PADS 6 each  6 each Topical Q0600 Elsie Baynes, Hervey Ard, PA-C       furosemide (LASIX) injection 80 mg  80 mg Intravenous Once Wynetta Fines  T, MD       gabapentin (NEURONTIN) capsule 300 mg  300 mg Oral QHS PRN Lequita Halt, MD       heparin injection 5,000 Units  5,000 Units Subcutaneous Q8H Wynetta Fines T, MD       hydrALAZINE (APRESOLINE) tablet 25 mg  25 mg Oral Q6H PRN Wynetta Fines T, MD       potassium chloride SA (KLOR-CON M) CR tablet 20 mEq  20 mEq Oral Once Wynetta Fines T, MD       sodium chloride flush (NS) 0.9 % injection 3 mL  3 mL Intravenous Q12H Wynetta Fines T, MD       sodium chloride flush (NS) 0.9 % injection 3 mL  3 mL Intravenous PRN Lequita Halt, MD       Current Outpatient Medications  Medication Sig Dispense Refill   amLODipine (NORVASC) 5 MG tablet Take 1 tablet (5 mg total) by mouth daily. (Patient not taking: Reported on 12/25/2021) 30 tablet 2   atorvastatin (LIPITOR) 40 MG tablet Take 1 tablet (40 mg total) by mouth daily. (Patient not taking: Reported on 12/25/2021) 30 tablet 2   folic acid (FOLVITE) 1 MG tablet Take 1 tablet (1 mg total) by mouth daily. (Patient not taking: Reported on 12/24/2021)     gabapentin (NEURONTIN) 300 MG capsule Take 1 capsule (300 mg total) by mouth at bedtime. (Patient taking differently: Take 300 mg by mouth at bedtime as needed (for nerve pain).) 90 capsule 3   Multiple Vitamin (MULTIVITAMIN WITH MINERALS) TABS tablet Take 1 tablet by mouth daily. (Patient not taking: Reported on 12/24/2021)     pantoprazole (PROTONIX) 40 MG tablet Take 1 tablet (40 mg total) by mouth 2 (two) times daily before a meal. (Patient not taking: Reported on 12/25/2021) 60 tablet 2   thiamine (VITAMIN B1) 100 MG tablet Take 1 tablet (100 mg total) by mouth daily. (Patient not taking: Reported on 12/25/2021)     traZODone (DESYREL) 50 MG tablet Take 1 tablet (50 mg total) by mouth at bedtime as needed for sleep. (Patient not taking: Reported on 12/24/2021) 90 tablet 1   Labs: Basic Metabolic Panel: Recent Labs  Lab 12/20/21 0645 12/25/21 0046 12/25/21 0700  NA 128* 127* 126*  K 3.9 3.2* 3.4*  CL  91* 88* 87*  CO2 21* 25 24  GLUCOSE 86 80 85  BUN 38* 15 16  CREATININE 8.94* 5.96* 6.21*  CALCIUM 8.6* 8.4* 8.6*  PHOS  --   --  3.8   Liver Function Tests: Recent Labs  Lab 12/20/21 0645 12/25/21 0046 12/25/21 0700  AST 48* 49*  --   ALT 15 14  --   ALKPHOS 108 104  --   BILITOT 1.3* 1.4*  --   PROT 7.8 8.0  --   ALBUMIN 2.3* 2.3* 2.3*   No results for input(s): "LIPASE", "AMYLASE" in the last 168 hours. No results for input(s): "AMMONIA" in the last 168 hours. CBC: Recent Labs  Lab 12/20/21 0645 12/25/21 0046 12/25/21 0700  WBC 3.2* 2.7* 2.8*  NEUTROABS  --  1.7 1.5*  HGB 8.2* 8.7* 8.7*  HCT 23.7* 25.2* 25.6*  MCV 94.0 94.0 96.6  PLT 92* 115* 115*   Cardiac Enzymes: No results for input(s): "CKTOTAL", "CKMB", "CKMBINDEX", "TROPONINI" in the last 168 hours. CBG: No results for input(s): "GLUCAP" in the last 168 hours. Iron Studies: No results for input(s): "IRON", "TIBC", "TRANSFERRIN", "FERRITIN" in the last 72 hours. Studies/Results: DG Chest 2 View  Result Date: 12/25/2021 CLINICAL DATA:  Chest pain, shortness of breath, EXAM: CHEST - 2 VIEW COMPARISON:  Chest radiograph dated 12/20/2021. CT chest dated 10/09/2021. FINDINGS: Multifocal patchy opacities in the lingula and bilateral lower lobes. Notably, while progressive, this was present on prior CT chest dated 10/09/2021. 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 Small right pleural effusion. Mild cardiomegaly.  Thoracic aortic atherosclerosis. IMPRESSION: Multifocal patchy opacities in the lingula and bilateral lower lobes, possibly reflecting multifocal pneumonia, but raising concern for multifocal semi invasive adenocarcinoma given persistence. Pulmonology consultation is suggested. Small right pleural effusion. Electronically Signed   By: Julian Hy M.D.   On: 12/25/2021 01:24    Dialysis  Orders: Center: [facility]  on MWF . [facility] 17H, 4 hours, BFR 450, DFR 500, EDW 101kg, 2K 2Ca AVF 15g  No heparin Sodium thiosulfate 25g IV q HD   Assessment/Plan:  Shortness of breath: Chest CT suggestive of multifocal pneumonia however based on clinical exam, suspect volume overload. Will plan for HD today with his max UF as tolerated.   ESRD:  HD on MWF schedule, has had some missed HD and ED HD treatments over the past several weeks. Continue MWF schedule here  Hypertension: BP elevated, likely worsened by volume overload. Planned for 4L UF as above.   Anemia: Hb 8.7. Hx recurrent GI bleeds but no known bleeding at present. Does not get ESA due to hepatocellular carcinoma  Metabolic bone disease: Calcium and phos controlled. Not on VDRA or binders  Nutrition:  Albumin low, also w/history of liver disease. Recommend protein supplements Hypokalemia: Mild, will use 4K bath with HD Hepatocellular carcinoma: Needs oncology follow up  Anice Paganini, PA-C 12/25/2021, 8:54 AM  [facility] Pager: [phone]

## 2021-12-25 NOTE — Progress Notes (Signed)
Patient states he feels SOB and cant catch breath. VS in normal range, given incentive spirometer to use, breathing treatment recently given. Re-evaluated patient had no relief. RRT and MD made aware. Patient now with emesis and more distress with work of breathing. Placed on 2 L of o2 until can be evaluated. New orders placed for nausea and new cough. PCCM made aware also and ordered tussonex. RRT placed patient on 4 L [place]. Pt now more comfortable and will be transported to dialysis at 1830 to help remove fluid. Will continue to monitor.

## 2021-12-25 NOTE — H&P (Signed)
History and Physical    Daniel [name] HQP:[id number] DOB: Dec 25, 1960 DOA: 12/25/2021  PCP: [name], NP (Confirm with patient/family/NH records and if not entered, this has to be entered at [facility] point of entry) Patient coming from: Home  I have personally briefly reviewed patient's old medical records in [place]  Chief Complaint: Cough, SOB  HPI: Daniel [name] is a 61 y.o. 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  Patient reported starting about 7 to 10 days ago, he started to have this productive cough is white to yellowish sputum production and increasing exertional dyspnea and leg swelling.  Denies any chest pain, no fever or chills.  He has been compliant with his dialysis and his last HD was this past Monday.  ED Course: Blood pressure elevated, no tachycardia, O2 saturation 94% on room air.  Physical exam showed extensively peripheral edema, bilateral crackles, chest x-ray showed worsening of right lower lobe and left mid and lower field infiltrates and consolidation.  Malignancy versus pneumonia.  Review of Systems: As per HPI otherwise 14 point review of systems negative.    Past Medical History:  Diagnosis Date   Anemia    Diabetes mellitus without complication [facility])    patient denies   Dialysis patient [facility])    End stage chronic kidney disease ([place])    Hypertension    ICH (intracerebral hemorrhage) ([place]) 05/20/2017   PAD (peripheral artery disease) (HCC)    Shoulder pain, left 06/28/2013    Past Surgical History:  Procedure Laterality Date   A/V FISTULAGRAM N/A 08/15/2020   Procedure:  A/V FISTULAGRAM - Left Upper;  Surgeon: Cherre Robins, MD;  Location: [place] CV LAB;  Service: Cardiovascular;  Laterality: N/A;   ABDOMINAL AORTOGRAM W/LOWER EXTREMITY N/A 04/08/2021   Procedure: ABDOMINAL AORTOGRAM W/LOWER EXTREMITY;  Surgeon: Waynetta Sandy, MD;  Location: [place] CV LAB;  Service: Cardiovascular;  Laterality: N/A;   AMPUTATION Left 04/16/2021   Procedure: LEFT BELOW KNEE AMPUTATION;  Surgeon: Angelia Mould, MD;  Location: [place];  Service: Vascular;  Laterality: Left;   AMPUTATION Left 06/17/2021   Procedure: REVISION AMPUTATION BELOW KNEE;  Surgeon: Broadus John, MD;  Location: [place];  Service: Vascular;  Laterality: Left;   APPENDECTOMY     APPLICATION OF WOUND VAC Left 06/17/2021   Procedure: APPLICATION OF WOUND VAC;  Surgeon: Broadus John, MD;  Location: [place];  Service: Vascular;  Laterality: Left;   AV FISTULA PLACEMENT Left 08/03/2019   Procedure: LEFT ARM ARTERIOVENOUS (AV) CEPHALIC  FISTULA CREATION;  Surgeon: Waynetta Sandy, MD;  Location: [place];  Service: Vascular;  Laterality: Left;   BIOPSY  06/30/2019   Procedure: BIOPSY;  Surgeon: Ronald Lobo, MD;  Location: [facility] ENDOSCOPY;  Service: Endoscopy;;   BIOPSY  08/02/2019   Procedure: BIOPSY;  Surgeon: Yetta Flock, MD;  Location: [facility] ENDOSCOPY;  Service: Gastroenterology;;   BIOPSY  02/08/2021   Procedure: BIOPSY;  Surgeon: Sharyn Creamer, MD;  Location: [facility] ENDOSCOPY;  Service: Gastroenterology;;   BIOPSY  02/24/2021   Procedure: BIOPSY;  Surgeon: [name], MD;  Location: [facility] ENDOSCOPY;  Service: Gastroenterology;;   BIOPSY  03/26/2021  Procedure: BIOPSY;  Surgeon: Lavena Bullion, DO;  Location: [facility] ENDOSCOPY;  Service: Gastroenterology;;   BIOPSY  08/06/2021   Procedure: BIOPSY;  Surgeon: [name], MD;  Location: [place];  Service: Gastroenterology;;   BIOPSY  09/15/2021   Procedure: BIOPSY;  Surgeon: Sharyn Creamer, MD;  Location:  [place];  Service: Gastroenterology;;   COLONOSCOPY  01/23/2012   Procedure: COLONOSCOPY;  Surgeon: Danie Binder, MD;  [facility]: [facility];  Service: Endoscopy;  Laterality: N/A;  11:10 AM   COLONOSCOPY WITH PROPOFOL N/A 06/30/2019   Procedure: COLONOSCOPY WITH PROPOFOL;  Surgeon: Ronald Lobo, MD;  Location: [place];  Service: Endoscopy;  Laterality: N/A;   ENTEROSCOPY N/A 08/02/2019   Procedure: ENTEROSCOPY;  Surgeon: Yetta Flock, MD;  Location: [facility] ENDOSCOPY;  Service: Gastroenterology;  Laterality: N/A;   ENTEROSCOPY N/A 02/24/2021   Procedure: ENTEROSCOPY;  Surgeon: [name], MD;  Location: [facility] ENDOSCOPY;  Service: Gastroenterology;  Laterality: N/A;   ENTEROSCOPY N/A 03/26/2021   Procedure: ENTEROSCOPY;  Surgeon: Lavena Bullion, DO;  Location: [place];  Service: Gastroenterology;  Laterality: N/A;   ENTEROSCOPY N/A 07/05/2021   Procedure: ENTEROSCOPY;  Surgeon: Carol Ada, MD;  Location: [place];  Service: Gastroenterology;  Laterality: N/A;   ENTEROSCOPY N/A 08/06/2021   Procedure: ENTEROSCOPY;  Surgeon: [name], MD;  Location: [facility] ENDOSCOPY;  Service: Gastroenterology;  Laterality: N/A;   ENTEROSCOPY N/A 08/24/2021   Procedure: ENTEROSCOPY;  Surgeon: Ladene Artist, MD;  Location: [facility] ENDOSCOPY;  Service: Gastroenterology;  Laterality: N/A;   ENTEROSCOPY N/A 09/15/2021   Procedure: ENTEROSCOPY;  Surgeon: Sharyn Creamer, MD;  Location: [facility] ENDOSCOPY;  Service: Gastroenterology;  Laterality: N/A;   ENTEROSCOPY N/A 10/06/2021   Procedure: ENTEROSCOPY;  Surgeon: [name], MD;  Location: [facility] ENDOSCOPY;  Service: Gastroenterology;  Laterality: N/A;   ENTEROSCOPY N/A 11/14/2021   Procedure: ENTEROSCOPY;  Surgeon: Doran Stabler, MD;  Location: [facility] ENDOSCOPY;  Service: Gastroenterology;  Laterality: N/A;   ESOPHAGOGASTRODUODENOSCOPY N/A 08/10/2020   Procedure: ESOPHAGOGASTRODUODENOSCOPY (EGD);  Surgeon: Jerene Bears, MD;   Location: [facility] ENDOSCOPY;  Service: Gastroenterology;  Laterality: N/A;   ESOPHAGOGASTRODUODENOSCOPY (EGD) WITH PROPOFOL N/A 06/30/2019   Procedure: ESOPHAGOGASTRODUODENOSCOPY (EGD) WITH PROPOFOL;  Surgeon: Ronald Lobo, MD;  Location: [place];  Service: Endoscopy;  Laterality: N/A;   ESOPHAGOGASTRODUODENOSCOPY (EGD) WITH PROPOFOL N/A 01/12/2021   Procedure: ESOPHAGOGASTRODUODENOSCOPY (EGD) WITH PROPOFOL;  Surgeon: Sharyn Creamer, MD;  Location: [place];  Service: Gastroenterology;  Laterality: N/A;   ESOPHAGOGASTRODUODENOSCOPY (EGD) WITH PROPOFOL N/A 02/08/2021   Procedure: ESOPHAGOGASTRODUODENOSCOPY (EGD) WITH PROPOFOL;  Surgeon: Sharyn Creamer, MD;  Location: [place];  Service: Gastroenterology;  Laterality: N/A;   FISTULA SUPERFICIALIZATION Left 10/17/2019   Procedure: LEFT UPPER EXTREMITY FISTULA REVISION, SIDE BRANCH LIGATION,  AND SUPERFICIALIZATION;  Surgeon: Marty Heck, MD;  Location: [place];  Service: Vascular;  Laterality: Left;   FISTULA SUPERFICIALIZATION Left 70/62/3762   Procedure: PLICATION OF ANEURYSM LEFT ARTERIOVENOUS FISTULA;  Surgeon: Angelia Mould, MD;  Location: [place];  Service: Vascular;  Laterality: Left;   FLEXIBLE SIGMOIDOSCOPY N/A 07/05/2021   Procedure: FLEXIBLE SIGMOIDOSCOPY;  Surgeon: Carol Ada, MD;  Location: [place];  Service: Gastroenterology;  Laterality: N/A;   GIVENS CAPSULE STUDY N/A 06/30/2019   Procedure: GIVENS CAPSULE STUDY;  Surgeon: Ronald Lobo, MD;  Location: [place];  Service: Endoscopy;  Laterality: N/A;   GIVENS CAPSULE STUDY N/A 08/25/2021   Procedure: GIVENS CAPSULE STUDY;  Surgeon: Ladene Artist,  MD;  Location: [place];  Service: Gastroenterology;  Laterality: N/A;   GIVENS CAPSULE STUDY N/A 10/06/2021   Procedure: GIVENS CAPSULE STUDY;  Surgeon: [name], MD;  Location: [place];  Service: Gastroenterology;  Laterality: N/A;   HEMOSTASIS CLIP PLACEMENT  08/10/2020   Procedure:  HEMOSTASIS CLIP PLACEMENT;  Surgeon: Jerene Bears, MD;  Location: [place];  Service: Gastroenterology;;   HEMOSTASIS CLIP PLACEMENT  01/12/2021   Procedure: HEMOSTASIS CLIP PLACEMENT;  Surgeon: Sharyn Creamer, MD;  Location: [place];  Service: Gastroenterology;;   HEMOSTASIS CONTROL  08/02/2019   Procedure: HEMOSTASIS CONTROL;  Surgeon: Yetta Flock, MD;  Location: [place];  Service: Gastroenterology;;   HOT HEMOSTASIS  02/24/2021   Procedure: HOT HEMOSTASIS (ARGON PLASMA COAGULATION/BICAP);  Surgeon: [name], MD;  Location: [facility] ENDOSCOPY;  Service: Gastroenterology;;   HOT HEMOSTASIS N/A 03/26/2021   Procedure: HOT HEMOSTASIS (ARGON PLASMA COAGULATION/BICAP);  Surgeon: Lavena Bullion, DO;  Location: [facility] ENDOSCOPY;  Service: Gastroenterology;  Laterality: N/A;   HOT HEMOSTASIS N/A 07/05/2021   Procedure: HOT HEMOSTASIS (ARGON PLASMA COAGULATION/BICAP);  Surgeon: Carol Ada, MD;  Location: [place];  Service: Gastroenterology;  Laterality: N/A;   HOT HEMOSTASIS N/A 09/15/2021   Procedure: HOT HEMOSTASIS (ARGON PLASMA COAGULATION/BICAP);  Surgeon: Sharyn Creamer, MD;  Location: [place];  Service: Gastroenterology;  Laterality: N/A;   HOT HEMOSTASIS N/A 10/06/2021   Procedure: HOT HEMOSTASIS (ARGON PLASMA COAGULATION/BICAP);  Surgeon: [name], MD;  Location: [place];  Service: Gastroenterology;  Laterality: N/A;   INCISION AND DRAINAGE ABSCESS N/A 06/29/2016   Procedure: INCISION AND DRAINAGE ABDOMINAL WALL ABSCESS;  Surgeon: Alphonsa Overall, MD;  Location: [facility];  Service: General;  Laterality: N/A;   INSERTION OF DIALYSIS CATHETER Right 08/03/2019   Procedure: INSERTION OF DIALYSIS CATHETER;  Surgeon: Waynetta Sandy, MD;  Location: [place];  Service: Vascular;  Laterality: Right;   INSERTION OF DIALYSIS CATHETER Right 10/22/2019   Procedure: INSERTION OF 23CM TUNNELED DIALYSIS CATHETER RIGHT INTERNAL JUGULAR;  Surgeon: Angelia Mould, MD;  Location: [place];  Service: Vascular;  Laterality: Right;   INSERTION OF DIALYSIS CATHETER Right 08/12/2020   Procedure: INSERTION OF Right internal Jugular TUNNELED  DIALYSIS CATHETER.;  Surgeon: Waynetta Sandy, MD;  Location: [place];  Service: Vascular;  Laterality: Right;   INSERTION OF DIALYSIS CATHETER N/A 04/19/2021   Procedure: INSERTION OF TUNNELED DIALYSIS CATHETER;  Surgeon: Angelia Mould, MD;  Location: [facility];  Service: Vascular;  Laterality: N/A;   IR 3D INDEPENDENT WKST  11/07/2021   IR ANGIOGRAM SELECTIVE EACH ADDITIONAL VESSEL  11/07/2021   IR ANGIOGRAM SELECTIVE EACH ADDITIONAL VESSEL  11/07/2021   IR ANGIOGRAM SELECTIVE EACH ADDITIONAL VESSEL  11/07/2021   IR ANGIOGRAM SELECTIVE EACH ADDITIONAL VESSEL  11/28/2021   IR ANGIOGRAM VISCERAL SELECTIVE  11/07/2021   IR ANGIOGRAM VISCERAL SELECTIVE  11/28/2021   IR EMBO ARTERIAL NOT HEMORR HEMANG INC GUIDE ROADMAPPING  11/07/2021   IR EMBO TUMOR ORGAN ISCHEMIA INFARCT INC GUIDE ROADMAPPING  11/28/2021   IR RADIOLOGIST EVAL & MGMT  09/12/2021   IR US GUIDE VASC ACCESS LEFT  11/07/2021   IR US GUIDE VASC ACCESS LEFT  11/28/2021   Left heel surgery     LOWER EXTREMITY ANGIOGRAPHY N/A 07/08/2021   Procedure: Lower Extremity Angiography;  Surgeon: Cherre Robins, MD;  Location: [place] CV LAB;  Service: Cardiovascular;  Laterality: N/A;   PENILE BIOPSY N/A 03/26/2020   Procedure: PENILE ULCER DEBRIDEMENT;  Surgeon: Remi Haggard, MD;  Location[facility];  Service: Urology;  Laterality: N/A;  30 MINS   PERIPHERAL VASCULAR INTERVENTION Right 07/08/2021   Procedure: PERIPHERAL VASCULAR INTERVENTION;  Surgeon: Cherre Robins, MD;  Location: [place] CV LAB;  Service: Cardiovascular;  Laterality: Right;   SCLEROTHERAPY  01/12/2021   Procedure: SCLEROTHERAPY;  Surgeon: Sharyn Creamer, MD;  Location: [facility] ENDOSCOPY;  Service: Gastroenterology;;   Clide Deutscher  02/24/2021   Procedure: Clide Deutscher;  Surgeon:  [name], MD;  Location: [place];  Service: Gastroenterology;;   SUBMUCOSAL TATTOO INJECTION  08/24/2021   Procedure: SUBMUCOSAL TATTOO INJECTION;  Surgeon: Ladene Artist, MD;  Location: [place];  Service: Gastroenterology;;   SUBMUCOSAL TATTOO INJECTION  09/15/2021   Procedure: SUBMUCOSAL TATTOO INJECTION;  Surgeon: Sharyn Creamer, MD;  Location: [place];  Service: Gastroenterology;;     reports that he has been smoking cigarettes. He has a 22.50 pack-year smoking history. He has never used smokeless tobacco. He reports that he does not currently use alcohol. He reports that he does not currently use drugs after having used the following drugs: "Crack" cocaine.  Allergies  Allergen Reactions   Seroquel [Quetiapine] Other (See Comments)    Tardive kinesia Dystonia    Dilaudid [Hydromorphone Hcl] Itching and Other (See Comments)    Pt reports itchiness after IM injection     Family History  Problem Relation Age of Onset   Colon cancer Neg Hx      Prior to Admission medications   Medication Sig Start Date End Date Taking? Authorizing Provider  amLODipine (NORVASC) 5 MG tablet Take 1 tablet (5 mg total) by mouth daily. Patient not taking: Reported on 12/25/2021 09/18/21 12/26/22  [place] Eric J, DO  atorvastatin (LIPITOR) 40 MG tablet Take 1 tablet (40 mg total) by mouth daily. Patient not taking: Reported on 12/25/2021 09/18/21 12/26/22  [place] Donnamarie Poag, DO  folic acid (FOLVITE) 1 MG tablet Take 1 tablet (1 mg total) by mouth daily. Patient not taking: Reported on 12/24/2021 12/12/21   Eugenie Filler, MD  gabapentin (NEURONTIN) 300 MG capsule Take 1 capsule (300 mg total) by mouth at bedtime. Patient taking differently: Take 300 mg by mouth at bedtime as needed (for nerve pain). 11/15/21   Nita Sells, MD  Multiple Vitamin (MULTIVITAMIN WITH MINERALS) TABS tablet Take 1 tablet by mouth daily. Patient not taking: Reported on 12/24/2021 12/12/21    Eugenie Filler, MD  pantoprazole (PROTONIX) 40 MG tablet Take 1 tablet (40 mg total) by mouth 2 (two) times daily before a meal. Patient not taking: Reported on 12/25/2021 10/10/21 01/08/22  Mercy Riding, MD  thiamine (VITAMIN B1) 100 MG tablet Take 1 tablet (100 mg total) by mouth daily. Patient not taking: Reported on 12/25/2021 12/12/21   Eugenie Filler, MD  traZODone (DESYREL) 50 MG tablet Take 1 tablet (50 mg total) by mouth at bedtime as needed for sleep. Patient not taking: Reported on 12/24/2021 11/15/21   Nita Sells, MD  colchicine 0.6 MG tablet Take 0.5 tablets (0.3 mg total) by mouth 2 (two) times daily. 07/24/20 07/29/20  Noemi Chapel, MD  furosemide (LASIX) 40 MG tablet Take 1 tablet (40 mg total) by mouth daily. 07/25/19 02/09/20  Ladona Horns, MD    Physical Exam: Vitals:   12/25/21 0400 12/25/21 0415 12/25/21 0515 12/25/21 0740  BP:  (!) 190/102 (!) 151/89 (!) 169/101  Pulse: 90 92 94 82  Resp: 14 (!) 21 17 16  Temp:  98.7 F (37.1 C)    TempSrc:  Oral    SpO2: 96% 91% 95% 94%    Constitutional: NAD, calm, comfortable Vitals:   12/25/21 0400 12/25/21 0415 12/25/21 0515 12/25/21 0740  BP:  (!) 190/102 (!) 151/89 (!) 169/101  Pulse: 90 92 94 82  Resp: 14 (!) '21 17 16  '$ Temp:  98.7 F (37.1 C)    TempSrc:  Oral    SpO2: 96% 91% 95% 94%   Eyes: PERRL, lids and conjunctivae normal ENMT: Mucous membranes are moist. Posterior pharynx clear of any exudate or lesions.Normal dentition.  Neck: normal, supple, no masses, no thyromegaly Respiratory: clear to auscultation bilaterally, no wheezing, coarse crackles on bilateral mid to low field.  Increasing respiratory effort. No accessory muscle use.  Cardiovascular: Regular rate and rhythm, no murmurs / rubs / gallops.  2+ extremity edema. 2+ pedal pulses. No carotid bruits.  Abdomen: no tenderness, no masses palpated. No hepatosplenomegaly. Bowel sounds positive.  Musculoskeletal: no clubbing / cyanosis. No joint  deformity upper and lower extremities. Good ROM, no contractures. Normal muscle tone.  Skin: no rashes, lesions, ulcers. No induration Neurologic: CN 2-12 grossly intact. Sensation intact, DTR normal. Strength 5/5 in all 4.  Psychiatric: Normal judgment and insight. Alert and oriented x 3. Normal mood.    Labs on Admission: I have personally reviewed following labs and imaging studies  CBC: Recent Labs  Lab 12/20/21 0645 12/25/21 0046 12/25/21 0700  WBC 3.2* 2.7* 2.8*  NEUTROABS  --  1.7 1.5*  HGB 8.2* 8.7* 8.7*  HCT 23.7* 25.2* 25.6*  MCV 94.0 94.0 96.6  PLT 92* 115* 950*   Basic Metabolic Panel: Recent Labs  Lab 12/20/21 0645 12/25/21 0046 12/25/21 0700  NA 128* 127* 126*  K 3.9 3.2* 3.4*  CL 91* 88* 87*  CO2 21* 25 24  GLUCOSE 86 80 85  BUN 38* 15 16  CREATININE 8.94* 5.96* 6.21*  CALCIUM 8.6* 8.4* 8.6*  MG  --   --  1.7  PHOS  --   --  3.8   GFR: Estimated Creatinine Clearance: 15.5 mL/min (A) (by C-G formula based on SCr of 6.21 mg/dL (H)). Liver Function Tests: Recent Labs  Lab 12/20/21 0645 12/25/21 0046 12/25/21 0700  AST 48* 49*  --   ALT 15 14  --   ALKPHOS 108 104  --   BILITOT 1.3* 1.4*  --   PROT 7.8 8.0  --   ALBUMIN 2.3* 2.3* 2.3*   No results for input(s): "LIPASE", "AMYLASE" in the last 168 hours. No results for input(s): "AMMONIA" in the last 168 hours. Coagulation Profile: No results for input(s): "INR", "PROTIME" in the last 168 hours. Cardiac Enzymes: No results for input(s): "CKTOTAL", "CKMB", "CKMBINDEX", "TROPONINI" in the last 168 hours. BNP (last 3 results) No results for input(s): "PROBNP" in the last 8760 hours. HbA1C: No results for input(s): "HGBA1C" in the last 72 hours. CBG: No results for input(s): "GLUCAP" in the last 168 hours. Lipid Profile: No results for input(s): "CHOL", "HDL", "LDLCALC", "TRIG", "CHOLHDL", "LDLDIRECT" in the last 72 hours. Thyroid Function Tests: No results for input(s): "TSH", "T4TOTAL",  "FREET4", "T3FREE", "THYROIDAB" in the last 72 hours. Anemia Panel: No results for input(s): "VITAMINB12", "FOLATE", "FERRITIN", "TIBC", "IRON", "RETICCTPCT" in the last 72 hours. Urine analysis:    Component Value Date/Time   COLORURINE YELLOW 12/09/2021 0900   APPEARANCEUR CLEAR 12/09/2021 0900   LABSPEC 1.008 12/09/2021 0900   PHURINE 5.0 12/09/2021 0900  GLUCOSEU NEGATIVE 12/09/2021 0900   HGBUR MODERATE (A) 12/09/2021 0900   BILIRUBINUR NEGATIVE 12/09/2021 0900   KETONESUR NEGATIVE 12/09/2021 0900   PROTEINUR 100 (A) 12/09/2021 0900   UROBILINOGEN 0.2 12/31/2009 0746   NITRITE NEGATIVE 12/09/2021 0900   LEUKOCYTESUR NEGATIVE 12/09/2021 0900    Radiological Exams on Admission: DG Chest 2 View  Result Date: 12/25/2021 CLINICAL DATA:  Chest pain, shortness of breath, EXAM: CHEST - 2 VIEW COMPARISON:  Chest radiograph dated 12/20/2021. CT chest dated 10/09/2021. FINDINGS: Multifocal patchy opacities in the lingula and bilateral lower lobes. Notably, while progressive, this was present on prior CT chest dated 10/09/2021. 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 Small right pleural effusion. Mild cardiomegaly.  Thoracic aortic atherosclerosis. IMPRESSION: Multifocal patchy opacities in the lingula and bilateral lower lobes, possibly reflecting multifocal pneumonia, but raising concern for multifocal semi invasive adenocarcinoma given persistence. Pulmonology consultation is suggested. Small right pleural effusion. Electronically Signed   By: Julian Hy M.D.   On: 12/25/2021 01:24    EKG: Independently reviewed.  Sinus, chronic QRS changes on V1-V3.  Assessment/Plan Principal Problem:   Volume overload Active Problems:   CHF (congestive heart failure) (HCC)  (please populate well all problems here in Problem List. (For example, if patient is on BP meds at home  and you resume or decide to hold them, it is a problem that needs to be her. Same for CAD, COPD, HLD and so on)  Acute on chronic HFpEF decompensation  -Significant fluid overload, Lasix 80 mg x 1 given -Nephrology consulted for emergency HD today. -Blood pressure poorly controlled, start Coreg, add as needed hydralazine. -Echocardiogram was done recently, will not repeat this time.  Worsening of bilateral lung infiltrates/consolidation -Reviewed patient's chest x-ray CT scan and PET scan from March this year showed that patient had a persistent infiltrates consolidation on right lower lung and left lingular and lower lung. -PET scan in March showed low metabolic activity of those lung lesions, which favors inflammation versus atelectasis other than malignancy.  However, repeat CT chest in June showed multifocal asymmetrical pulmonary consolidation and groundglass pulmonary infiltrates, still favored infection.  His x-ray on admission 2 weeks ago again showed similar bilateral lung infiltrates/consolidation, and on today's chest x-ray, in addition to signs of fluid overload again the bilateral infiltrates/consolidation are mentioned.  I discussed the case with on-call pulmonary, went through the series of images, and decide to do a CT without contrast and pulmonary will help after review of the case and decide whether patient deserves a bronchoscopy at this time. -Other DDx, patient has no fever no tachycardia or other systemic inflammation signs, check procalcitonin, hold off antibiotics for now.  Moderate-severe pulmonary hypertension -May need to go home with p.o. Lasix  Liver cirrhosis -May need to consider chronic outpatient Lasix treatment  HTN, poorly controlled -As above  Liver CA, stage II -Following with oncology, on Y90 via IR treatment, also received 1 treatment of Sandostatin several months ago, no new treatment since then.  Chronic iron deficiency anemia -EGD done recently,  etiology secondary to GAVE syndrome and duodenum AVM.  Denies any black tarry stool and H&H stable. -Outpatient follow-up with oncology for iron infusion.  Chronic hyponatremia -Significant CHF and cirrhosis, management as above.  Patient aware of salt and fluid restriction.  ESRD -HD today  Cigarette smoking -Cessation education performed at bedside  Chronic leukopenia and thrombocytopenia -WBC and platelet level stable,  no acute concern, INR has been normal.  DVT prophylaxis: Heparin subcu Code Status: Full code Family Communication: None at bedside Disposition Plan: Expect less than 2 midnight hospital stay Consults called: Pulmonary, nephrology Admission status: Telemetry observation   Lequita Halt MD [facility] Pager [phone]  12/25/2021, 8:34 AM

## 2021-12-25 NOTE — ED Notes (Signed)
Patient transported to CT 

## 2021-12-25 NOTE — ED Provider Notes (Signed)
[place] [facility] EMERGENCY DEPARTMENT Provider Note   CSN: [id number] Arrival date & time: 12/25/21  0017     History  Chief Complaint  Patient presents with   Shortness of Breath    Daniel [name] is a 61 y.o. male.  The history is provided by the patient.  Shortness of Breath Severity:  Moderate Onset quality:  Gradual Timing:  Intermittent Progression:  Worsening Chronicity:  Recurrent Relieved by:  Rest Worsened by:  Activity Associated symptoms: chest pain and cough   Associated symptoms: no fever    Patient with history of chronic renal failure presents with shortness of breath.  Patient reports over the past day he had increasing lower extremity edema, shortness of breath.  Reports he feels like he needs dialysis.  He is also had chest tightness.  No fevers or vomiting.  No hemoptysis.  No syncope.  He is a current smoker and has no intentions of quitting.  He reports he got a full dialysis on August 28    Home Medications Prior to Admission medications   Medication Sig Start Date End Date Taking? Authorizing Provider  amLODipine (NORVASC) 5 MG tablet Take 1 tablet (5 mg total) by mouth daily. 09/18/21 12/17/21  [place] Donnamarie Poag, DO  atorvastatin (LIPITOR) 40 MG tablet Take 1 tablet (40 mg total) by mouth daily. 09/18/21 12/17/21  [place] Donnamarie Poag, DO  folic acid (FOLVITE) 1 MG tablet Take 1 tablet (1 mg total) by mouth daily. Patient not taking: Reported on 12/24/2021 12/12/21   Eugenie Filler, MD  gabapentin (NEURONTIN) 300 MG capsule Take 1 capsule (300 mg total) by mouth at bedtime. 11/15/21   Nita Sells, MD  Multiple Vitamin (MULTIVITAMIN WITH MINERALS) TABS tablet Take 1 tablet by mouth daily. Patient not taking: Reported on 12/24/2021 12/12/21   Eugenie Filler, MD  pantoprazole (PROTONIX) 40 MG tablet Take 1 tablet (40 mg total) by mouth 2 (two) times daily before a meal. 10/10/21 01/08/22  Mercy Riding, MD  thiamine (VITAMIN B1) 100 MG  tablet Take 1 tablet (100 mg total) by mouth daily. Patient not taking: Reported on 12/24/2021 12/12/21   Eugenie Filler, MD  traZODone (DESYREL) 50 MG tablet Take 1 tablet (50 mg total) by mouth at bedtime as needed for sleep. Patient not taking: Reported on 12/24/2021 11/15/21   Nita Sells, MD  colchicine 0.6 MG tablet Take 0.5 tablets (0.3 mg total) by mouth 2 (two) times daily. 07/24/20 07/29/20  Noemi Chapel, MD  furosemide (LASIX) 40 MG tablet Take 1 tablet (40 mg total) by mouth daily. 07/25/19 02/09/20  Ladona Horns, MD      Allergies    Seroquel [quetiapine] and Dilaudid [hydromorphone hcl]    Review of Systems   Review of Systems  Constitutional:  Negative for fever.  Respiratory:  Positive for cough and shortness of breath.   Cardiovascular:  Positive for chest pain and leg swelling.    Physical Exam Updated Vital Signs BP (!) 167/88 (BP Location: Right Arm)   Pulse 94   Temp 98.6 F (37 C) (Oral)   Resp 16   SpO2 96%  Physical Exam CONSTITUTIONAL: Chronically ill-appearing HEAD: Normocephalic/atraumatic EYES: EOMI/PERRL ENMT: Mucous membranes moist NECK: supple no meningeal signs, JVD noted CV: S1/S2 noted LUNGS: Crackles bilaterally, tachypnea ABDOMEN: soft, nontender, no rebound or guarding, bowel sounds noted throughout abdomen GU:no cva tenderness NEURO: Pt is awake/alert/appropriate EXTREMITIES: pulses normal/equal, left AKA noted Significant lower extremity pitting edema is noted in  bilateral lower extremities SKIN: warm, color normal PSYCH: no abnormalities of mood noted, alert and oriented to situation  ED Results / Procedures / Treatments   Labs (all labs ordered are listed, but only abnormal results are displayed) Labs Reviewed  COMPREHENSIVE METABOLIC PANEL - Abnormal; Notable for the following components:      Result Value   Sodium 127 (*)    Potassium 3.2 (*)    Chloride 88 (*)    Creatinine, Ser 5.96 (*)    Calcium 8.4 (*)     Albumin 2.3 (*)    AST 49 (*)    Total Bilirubin 1.4 (*)    GFR, Estimated 10 (*)    All other components within normal limits  CBC WITH DIFFERENTIAL/PLATELET - Abnormal; Notable for the following components:   WBC 2.7 (*)    RBC 2.68 (*)    Hemoglobin 8.7 (*)    HCT 25.2 (*)    RDW 17.7 (*)    Platelets 115 (*)    Lymphs Abs 0.5 (*)    All other components within normal limits  TROPONIN I (HIGH SENSITIVITY) - Abnormal; Notable for the following components:   Troponin I (High Sensitivity) 27 (*)    All other components within normal limits  TROPONIN I (HIGH SENSITIVITY)    EKG ED ECG REPORT   Date: 12/25/2021 0020  Rate: 95  Rhythm: normal sinus rhythm  QRS Axis: normal  Intervals: normal  ST/T Wave abnormalities: nonspecific ST changes  Conduction Disutrbances:none  Narrative Interpretation:   Old EKG Reviewed: unchanged  I have personally reviewed the EKG tracing and agree with the computerized printout as noted.  Radiology DG Chest 2 View  Result Date: 12/25/2021 CLINICAL DATA:  Chest pain, shortness of breath, EXAM: CHEST - 2 VIEW COMPARISON:  Chest radiograph dated 12/20/2021. CT chest dated 10/09/2021. FINDINGS: Multifocal patchy opacities in the lingula and bilateral lower lobes. Notably, while progressive, this was present on prior CT chest dated 10/09/2021. 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 Small right pleural effusion. Mild cardiomegaly.  Thoracic aortic atherosclerosis. IMPRESSION: Multifocal patchy opacities in the lingula and bilateral lower lobes, possibly reflecting multifocal pneumonia, but raising concern for multifocal semi invasive adenocarcinoma given persistence. Pulmonology consultation is suggested. Small right pleural effusion. Electronically Signed   By: Julian Hy M.D.   On: 12/25/2021 01:24    Procedures Procedures     Medications Ordered in ED Medications  ipratropium-albuterol (DUONEB) 0.5-2.5 (3) MG/3ML nebulizer solution 3 mL (has no administration in time range)  acetaminophen (TYLENOL) tablet 650 mg (has no administration in time range)    Or  acetaminophen (TYLENOL) suppository 650 mg (has no administration in time range)  HYDROcodone-acetaminophen (NORCO/VICODIN) 5-325 MG per tablet 1 tablet (1 tablet Oral Given 12/25/21 0528)    ED Course/ Medical Decision Making/ A&P Clinical Course as of 12/25/21 0542  Wed Dec 25, 2021  0326 Sodium(!): 127 Hyponatremia [DW]  0326 Hemoglobin(!): 8.7 Anemia [DW]  0326 Creatinine(!): 5.96 Chronic renal failure [DW]  0541 Patient reports he has seen pulmonology before, but does not recall a final diagnosis.  His records reveal pulmonology consult back in April, but I do not see any recent evaluations.  Is also difficult to determine due to history of stage renal disease and needing dialysis with likely a component of pulmonary edema.  She still reports feeling short of breath.  Patient will be admitted for further evaluation  and likely pulmonology consult.  He will also need dialysis.  Discussed with on-call hospitalist Dr. Velia Meyer for admission [DW]    Clinical Course User Index [DW] Ripley Fraise, MD                           Medical Decision Making Amount and/or Complexity of Data Reviewed Labs:  Decision-making details documented in ED Course.  Risk Prescription drug management. Decision regarding hospitalization.   This patient presents to the ED for concern of shortness of breath, this involves an extensive number of treatment options, and is a complaint that carries with it a high risk of complications and morbidity.  The differential diagnosis includes but is not limited to Acute coronary syndrome, pneumonia, acute pulmonary edema, pneumothorax, acute anemia, pulmonary embolism    Comorbidities that complicate the patient  evaluation: Patient's presentation is complicated by their history of end-stage renal disease  Social Determinants of Health: Patient's  tobacco use   increases the complexity of managing their presentation  Additional history obtained: Records reviewed previous admission documents  Lab Tests: I Ordered, and personally interpreted labs.  The pertinent results include: Chronic renal failure  Imaging Studies ordered: I ordered imaging studies including X-ray chest   I independently visualized and interpreted imaging which showed infiltrate versus pneumonia versus adenocarcinoma I agree with the radiologist interpretation  Cardiac Monitoring: The patient was maintained on a cardiac monitor.  I personally viewed and interpreted the cardiac monitor which showed an underlying rhythm of:  sinus rhythm  Critical Interventions:  We will admit for dialysis and pulmonology consult  Consultations Obtained: I requested consultation with the radiologist Dr. Lovena Le , and discussed  findings as well as pertinent plan - they recommend: Recommend pulmonary consultation as this could be adenocarcinoma, will defer CT imaging for now given multiple imaging modalities in the past  Reevaluation: After the interventions noted above, I reevaluated the patient and found that they have :stayed the same  Complexity of problems addressed: Patient's presentation is most consistent with  acute presentation with potential threat to life or bodily function  Disposition: After consideration of the diagnostic results and the patient's response to treatment,  I feel that the patent would benefit from admission   .           Final Clinical Impression(s) / ED Diagnoses Final diagnoses:  ESRD on dialysis [facility])  Shortness of breath    Rx / DC Orders ED Discharge Orders     None         Ripley Fraise, MD 12/25/21 [phone]

## 2021-12-25 NOTE — Consult Note (Signed)
NAME:  Daniel [name], MRN:  [id number], DOB:  Apr 02, 1961, LOS: 0 ADMISSION DATE:  12/25/2021, CONSULTATION DATE:  8/30 REFERRING MD:  Roosevelt Locks, CHIEF COMPLAINT:  dyspnea   History of Present Illness:  61 year old male with an extensive past medical history came to the emergency department on August 30 complaining of dyspnea and leg swelling.  He has a history of end-stage renal disease and receives hemodialysis Monday Wednesday and Friday.  He has been in and out of the hospital quite a bit this year with multiple problems including hepatocellular carcinoma which has been treated with IR guided radiofrequency ablation, multiple pulmonary nodules which have been assessed by thoracic surgery, pulmonology, and oncology services.  He says that shortness of breath has been persistent for months but he came in today because he was still quite short of breath and had significant leg swelling.  He says he underwent a 4-hour dialysis session on Monday and had volume removed.  He denies cough, mucus production or hemoptysis.  In the spring of this year he was seen by cardiothoracic surgery, oncology, and pulmonology for pulmonary nodules.  He had bilateral pulmonary nodules seen on a CT scan of the chest, a PET scan was ordered which showed no significant nodule in the right lung or FDG avid lesions on the right.  On the left he had an ill-defined lesion the left lower lobe which was felt to be possibly atelectasis and was not significantly FDG avid either.  At this time plans were made to have a repeat CT scan done several weeks later.  In the interim the patient had his hepatocellular carcinoma diagnosed and treated and underwent multiple procedures.  In June 2023 he had a repeat CT scan of his chest which showed extensive left lung infiltrate.  Unfortunately he did not follow-up with pulmonology, oncology, or thoracic surgery at this point.  He continues to smoke cigarettes.  Pulmonology is consulted  today in the setting of extensive bilateral infiltrates.  Denies trouble swallowing, choking on food or vomiting.   Pertinent  Medical History  ESRD HCC diagnosed 2023, treated with radioablation treatment by IR on 11/28/2021 Cirrhosis Cigarette smoker DM2 Hypertension ICH Peripheral arterial disease Anemia of chronic kidney disease, iron deficiency on feraheme Pulmonary hypertension> 06/2021 TTE> RVSP 48 mm Hg, LVEF 55-60%, LAE, RAE, visualized valves were normal, RA pressure estimate 9m Hg PFR 06/2021 Ratio normal, FVC 2.40L (57% pred), TLC 4.14 L (57% pred), DLCO 11.16 ml/min/mmHg 39% pred)  Significant Hospital Events: Including procedures, antibiotic start and stop dates in addition to other pertinent events   8/29 admission for dyspnea, volume overload 8/29 CT Chest> extensive bilateral infiltrates L > R, bilateral effusions, cirrhosis with ascites  Interim History / Subjective:  As above  Objective   Blood pressure (!) 169/101, pulse 82, temperature 98.7 F (37.1 C), temperature source Oral, resp. rate 16, SpO2 94 %.       No intake or output data in the 24 hours ending 12/25/21 0840 There were no vitals filed for this visit.  Examination:  General:  Chronically ill appearing, resting comfortably in bed HENT: NCAT OP clear PULM: Crackles bases B, normal effort CV: RRR, no mgr GI: BS+, soft, nontender MSK: normal bulk and tone Derm: massive leg edema Neuro: awake, alert, no distress, MAEW   Resolved Hospital Problem list     Assessment & Plan:  Abnormal CT scan of the Chest  Bilateral pulmonary infiltrates Dyspnea> multi-factorial Volume overload/anasarca> related to ESRD and cirrhosis  Cirrhosis> hep C, prior EtOH HCC Pulmonary hypertension R>L small pleural effusions  Discussion: It's not clear to me what is causing the infiltrates in Sender's lungs.  Given his smoking history and the progressive nature of these he needs a bronchoscopy with biopsy to  assess further as the risk of bronchogenic carcinoma is very high.  That being said the infiltrates have waxed and waned over the last year to some degree so I worry about inflammatory causes, drug effect, aspiration etc.  A bronchoscopy with biopsy is somewhat high risk given his comorbid illnesses including relative thrombocytopenia and pulmonary hypertension.  These infiltrates may be the cause of his dyspnea rather than the volume overload, though clearly his anasarca contributes to some degree.    Plan Bronchoscopy with TBBX and BAL Can continue antibiotics as you are doing, though clinical picture not consistent with pneumonia Would remove as much volume as possible with ESRD   Best Practice (right click and "Reselect all SmartList Selections" daily)   Per [facility]  Labs   CBC: Recent Labs  Lab 12/20/21 0645 12/25/21 0046 12/25/21 0700  WBC 3.2* 2.7* 2.8*  NEUTROABS  --  1.7 1.5*  HGB 8.2* 8.7* 8.7*  HCT 23.7* 25.2* 25.6*  MCV 94.0 94.0 96.6  PLT 92* 115* 115*    Basic Metabolic Panel: Recent Labs  Lab 12/20/21 0645 12/25/21 0046 12/25/21 0700  NA 128* 127* 126*  K 3.9 3.2* 3.4*  CL 91* 88* 87*  CO2 21* 25 24  GLUCOSE 86 80 85  BUN 38* 15 16  CREATININE 8.94* 5.96* 6.21*  CALCIUM 8.6* 8.4* 8.6*  MG  --   --  1.7  PHOS  --   --  3.8   GFR: Estimated Creatinine Clearance: 15.5 mL/min (A) (by C-G formula based on SCr of 6.21 mg/dL (H)). Recent Labs  Lab 12/20/21 0645 12/25/21 0046 12/25/21 0700  WBC 3.2* 2.7* 2.8*    Liver Function Tests: Recent Labs  Lab 12/20/21 0645 12/25/21 0046 12/25/21 0700  AST 48* 49*  --   ALT 15 14  --   ALKPHOS 108 104  --   BILITOT 1.3* 1.4*  --   PROT 7.8 8.0  --   ALBUMIN 2.3* 2.3* 2.3*   No results for input(s): "LIPASE", "AMYLASE" in the last 168 hours. No results for input(s): "AMMONIA" in the last 168 hours.  ABG    Component Value Date/Time   PHART 7.358 05/20/2017 2157   PCO2ART 38.9 05/20/2017 2157   PO2ART  74.0 (L) 05/20/2017 2157   HCO3 22.0 05/20/2017 2157   TCO2 20 (L) 11/27/2021 0657   ACIDBASEDEF 3.0 (H) 05/20/2017 2157   O2SAT 94.0 05/20/2017 2157     Coagulation Profile: No results for input(s): "INR", "PROTIME" in the last 168 hours.  Cardiac Enzymes: No results for input(s): "CKTOTAL", "CKMB", "CKMBINDEX", "TROPONINI" in the last 168 hours.  HbA1C: Hemoglobin A1C  Date/Time Value Ref Range Status  09/03/2021 09:31 AM 5.2 4.0 - 5.6 % Final  09/26/2020 09:30 AM 5.4 4.0 - 5.6 % Final   Hgb A1c MFr Bld  Date/Time Value Ref Range Status  03/26/2020 10:22 AM 6.4 (H) 4.8 - 5.6 % Final    Comment:    (NOTE) Pre diabetes:          5.7%-6.4%  Diabetes:              >6.4%  Glycemic control for   <7.0% adults with diabetes   05/21/2017 06:32 AM 6.7 (  H) 4.8 - 5.6 % Final    Comment:    (NOTE) Pre diabetes:          5.7%-6.4% Diabetes:              >6.4% Glycemic control for   <7.0% adults with diabetes     CBG: No results for input(s): "GLUCAP" in the last 168 hours.  Review of Systems:   Gen: Denies fever, chills, weight change, fatigue, night sweats HEENT: Denies blurred vision, double vision, hearing loss, tinnitus, sinus congestion, rhinorrhea, sore throat, neck stiffness, dysphagia PULM: per HPI CV: Denies chest pain, edema, orthopnea, paroxysmal nocturnal dyspnea, palpitations GI: Denies abdominal pain, nausea, vomiting, diarrhea, hematochezia, melena, constipation, change in bowel habits GU: Denies dysuria, hematuria, polyuria, oliguria, urethral discharge Endocrine: Denies hot or cold intolerance, polyuria, polyphagia or appetite change Derm: Denies rash, dry skin, scaling or peeling skin change Heme: Denies easy bruising, bleeding, bleeding gums Neuro: Denies headache, numbness, weakness, slurred speech, loss of memory or consciousness   Past Medical History:  He,  has a past medical history of Anemia, Diabetes mellitus without complication ([place]), Dialysis  patient ([place]), End stage chronic kidney disease ([place]), Hypertension, ICH (intracerebral hemorrhage) ([place]) (05/20/2017), PAD (peripheral artery disease) ([place]), and Shoulder pain, left (06/28/2013).   Surgical History:   Past Surgical History:  Procedure Laterality Date   A/V FISTULAGRAM N/A 08/15/2020   Procedure: A/V FISTULAGRAM - Left Upper;  Surgeon: Cherre Robins, MD;  Location: [place] CV LAB;  Service: Cardiovascular;  Laterality: N/A;   ABDOMINAL AORTOGRAM W/LOWER EXTREMITY N/A 04/08/2021   Procedure: ABDOMINAL AORTOGRAM W/LOWER EXTREMITY;  Surgeon: Waynetta Sandy, MD;  Location: [place] CV LAB;  Service: Cardiovascular;  Laterality: N/A;   AMPUTATION Left 04/16/2021   Procedure: LEFT BELOW KNEE AMPUTATION;  Surgeon: Angelia Mould, MD;  Location: [place];  Service: Vascular;  Laterality: Left;   AMPUTATION Left 06/17/2021   Procedure: REVISION AMPUTATION BELOW KNEE;  Surgeon: Broadus John, MD;  Location: [place];  Service: Vascular;  Laterality: Left;   APPENDECTOMY     APPLICATION OF WOUND VAC Left 06/17/2021   Procedure: APPLICATION OF WOUND VAC;  Surgeon: Broadus John, MD;  Location: [place];  Service: Vascular;  Laterality: Left;   AV FISTULA PLACEMENT Left 08/03/2019   Procedure: LEFT ARM ARTERIOVENOUS (AV) CEPHALIC  FISTULA CREATION;  Surgeon: Waynetta Sandy, MD;  Location: [place];  Service: Vascular;  Laterality: Left;   BIOPSY  06/30/2019   Procedure: BIOPSY;  Surgeon: Ronald Lobo, MD;  Location: [facility] ENDOSCOPY;  Service: Endoscopy;;   BIOPSY  08/02/2019   Procedure: BIOPSY;  Surgeon: Yetta Flock, MD;  Location: [facility] END[facility]COPY;  Service: Gastroenterology;;   BIOPSY  02/08/2021   Procedure: BIOPSY;  Surgeon: Sharyn Creamer, MD;  Location: [facility] ENDOSCOPY;  Service: Gastroenterology;;   BIOPSY  02/24/2021   Procedure: BIOPSY;  Surgeon: [name], MD;  Location: [facility] ENDOSCOPY;  Service: Gastroenterology;;   BIOPSY  03/26/2021    Procedure: BIOPSY;  Surgeon: Lavena Bullion, DO;  Location: [place];  Service: Gastroenterology;;   BIOPSY  08/06/2021   Procedure: BIOPSY;  Surgeon: [name], MD;  Location: [place];  Service: Gastroenterology;;   BIOPSY  09/15/2021   Procedure: BIOPSY;  Surgeon: Sharyn Creamer, MD;  Location: [place];  Service: Gastroenterology;;   COLONOSCOPY  01/23/2012   Procedure: COLONOSCOPY;  Surgeon: Danie Binder, MD;  [facility];  Service: Endoscopy;  Laterality: N/A;  11:10 AM   COLONOSCOPY WITH PROPOFOL N/A 06/30/2019   Procedure: COLONOSCOPY WITH PROPOFOL;  Surgeon: Ronald Lobo, MD;  Location: [place];  Service: Endoscopy;  Laterality: N/A;   ENTEROSCOPY N/A 08/02/2019   Procedure: ENTEROSCOPY;  Surgeon: Yetta Flock, MD;  Location: [facility] ENDOSCOPY;  Service: Gastroenterology;  Laterality: N/A;   ENTEROSCOPY N/A 02/24/2021   Procedure: ENTEROSCOPY;  Surgeon: [name], MD;  Location: [facility] ENDOSCOPY;  Service: Gastroenterology;  Laterality: N/A;   ENTEROSCOPY N/A 03/26/2021   Procedure: ENTEROSCOPY;  Surgeon: Lavena Bullion, DO;  Location: [place];  Service: Gastroenterology;  Laterality: N/A;   ENTEROSCOPY N/A 07/05/2021   Procedure: ENTEROSCOPY;  Surgeon: Carol Ada, MD;  Location: [place];  Service: Gastroenterology;  Laterality: N/A;   ENTEROSCOPY N/A 08/06/2021   Procedure: ENTEROSCOPY;  Surgeon: [name], MD;  Location: [facility] ENDOSCOPY;  Service: Gastroenterology;  Laterality: N/A;   ENTEROSCOPY N/A 08/24/2021   Procedure: ENTEROSCOPY;  Surgeon: Ladene Artist, MD;  Location: [facility] ENDOSCOPY;  Service: Gastroenterology;  Laterality: N/A;   ENTEROSCOPY N/A 09/15/2021   Procedure: ENTEROSCOPY;  Surgeon: Sharyn Creamer, MD;  Location: [facility] ENDOSCOPY;  Service: Gastroenterology;  Laterality: N/A;   ENTEROSCOPY N/A 10/06/2021   Procedure: ENTEROSCOPY;  Surgeon: [name], MD;  Location: [facility] ENDOSCOPY;  Service:  Gastroenterology;  Laterality: N/A;   ENTEROSCOPY N/A 11/14/2021   Procedure: ENTEROSCOPY;  Surgeon: Doran Stabler, MD;  Location: [facility] ENDOSCOPY;  Service: Gastroenterology;  Laterality: N/A;   ESOPHAGOGASTRODUODENOSCOPY N/A 08/10/2020   Procedure: ESOPHAGOGASTRODUODENOSCOPY (EGD);  Surgeon: Jerene Bears, MD;  Location: [facility] ENDOSCOPY;  Service: Gastroenterology;  Laterality: N/A;   ESOPHAGOGASTRODUODENOSCOPY (EGD) WITH PROPOFOL N/A 06/30/2019   Procedure: ESOPHAGOGASTRODUODENOSCOPY (EGD) WITH PROPOFOL;  Surgeon: Ronald Lobo, MD;  Location: [place];  Service: Endoscopy;  Laterality: N/A;   ESOPHAGOGASTRODUODENOSCOPY (EGD) WITH PROPOFOL N/A 01/12/2021   Procedure: ESOPHAGOGASTRODUODENOSCOPY (EGD) WITH PROPOFOL;  Surgeon: Sharyn Creamer, MD;  Location: [place];  Service: Gastroenterology;  Laterality: N/A;   ESOPHAGOGASTRODUODENOSCOPY (EGD) WITH PROPOFOL N/A 02/08/2021   Procedure: ESOPHAGOGASTRODUODENOSCOPY (EGD) WITH PROPOFOL;  Surgeon: Sharyn Creamer, MD;  Location: [place];  Service: Gastroenterology;  Laterality: N/A;   FISTULA SUPERFICIALIZATION Left 10/17/2019   Procedure: LEFT UPPER EXTREMITY FISTULA REVISION, SIDE BRANCH LIGATION,  AND SUPERFICIALIZATION;  Surgeon: Marty Heck, MD;  Location: [place];  Service: Vascular;  Laterality: Left;   FISTULA SUPERFICIALIZATION Left 51/70/0174   Procedure: PLICATION OF ANEURYSM LEFT ARTERIOVENOUS FISTULA;  Surgeon: Angelia Mould, MD;  Location: [place];  Service: Vascular;  Laterality: Left;   FLEXIBLE SIGMOIDOSCOPY N/A 07/05/2021   Procedure: FLEXIBLE SIGMOIDOSCOPY;  Surgeon: Carol Ada, MD;  Location: [place];  Service: Gastroenterology;  Laterality: N/A;   GIVENS CAPSULE STUDY N/A 06/30/2019   Procedure: GIVENS CAPSULE STUDY;  Surgeon: Ronald Lobo, MD;  Location: [place];  Service: Endoscopy;  Laterality: N/A;   GIVENS CAPSULE STUDY N/A 08/25/2021   Procedure: GIVENS CAPSULE STUDY;  Surgeon: Ladene Artist, MD;  Location: [place];  Service: Gastroenterology;  Laterality: N/A;   GIVENS CAPSULE STUDY N/A 10/06/2021   Procedure: GIVENS CAPSULE STUDY;  Surgeon: [name], MD;  Location: [place];  Service: Gastroenterology;  Laterality: N/A;   HEMOSTASIS CLIP PLACEMENT  08/10/2020   Procedure: HEMOSTASIS CLIP PLACEMENT;  Surgeon: Jerene Bears, MD;  Location: [facility] ENDOSCOPY;  Service: Gastroenterology;;   HEMOSTASIS CLIP PLACEMENT  01/12/2021   Procedure: HEMOSTASIS CLIP PLACEMENT;  Surgeon: Dayna Barker  C, MD;  Location: [place];  Service: Gastroenterology;;   HEMOSTASIS CONTROL  08/02/2019   Procedure: HEMOSTASIS CONTROL;  Surgeon: Yetta Flock, MD;  Location: [place];  Service: Gastroenterology;;   HOT HEMOSTASIS  02/24/2021   Procedure: HOT HEMOSTASIS (ARGON PLASMA COAGULATION/BICAP);  Surgeon: [name], MD;  Location: [facility] ENDOSCOPY;  Service: Gastroenterology;;   HOT HEMOSTASIS N/A 03/26/2021   Procedure: HOT HEMOSTASIS (ARGON PLASMA COAGULATION/BICAP);  Surgeon: Lavena Bullion, DO;  Location: [facility] ENDOSCOPY;  Service: Gastroenterology;  Laterality: N/A;   HOT HEMOSTASIS N/A 07/05/2021   Procedure: HOT HEMOSTASIS (ARGON PLASMA COAGULATION/BICAP);  Surgeon: Carol Ada, MD;  Location: [place];  Service: Gastroenterology;  Laterality: N/A;   HOT HEMOSTASIS N/A 09/15/2021   Procedure: HOT HEMOSTASIS (ARGON PLASMA COAGULATION/BICAP);  Surgeon: Sharyn Creamer, MD;  Location: [place];  Service: Gastroenterology;  Laterality: N/A;   HOT HEMOSTASIS N/A 10/06/2021   Procedure: HOT HEMOSTASIS (ARGON PLASMA COAGULATION/BICAP);  Surgeon: [name], MD;  Location: [place];  Service: Gastroenterology;  Laterality: N/A;   INCISION AND DRAINAGE ABSCESS N/A 06/29/2016   Procedure: INCISION AND DRAINAGE ABDOMINAL WALL ABSCESS;  Surgeon: Alphonsa Overall, MD;  Location: [facility];  Service: General;  Laterality: N/A;   INSERTION OF DIALYSIS CATHETER  Right 08/03/2019   Procedure: INSERTION OF DIALYSIS CATHETER;  Surgeon: Waynetta Sandy, MD;  Location: [place];  Service: Vascular;  Laterality: Right;   INSERTION OF DIALYSIS CATHETER Right 10/22/2019   Procedure: INSERTION OF 23CM TUNNELED DIALYSIS CATHETER RIGHT INTERNAL JUGULAR;  Surgeon: Angelia Mould, MD;  Location: [place];  Service: Vascular;  Laterality: Right;   INSERTION OF DIALYSIS CATHETER Right 08/12/2020   Procedure: INSERTION OF Right internal Jugular TUNNELED  DIALYSIS CATHETER.;  Surgeon: Waynetta Sandy, MD;  Location: [place];  Service: Vascular;  Laterality: Right;   INSERTION OF DIALYSIS CATHETER N/A 04/19/2021   Procedure: INSERTION OF TUNNELED DIALYSIS CATHETER;  Surgeon: Angelia Mould, MD;  Location[facility];  Service: Vascular;  Laterality: N/A;   IR 3D INDEPENDENT WKST  11/07/2021   IR ANGIOGRAM SELECTIVE EACH ADDITIONAL VESSEL  11/07/2021   IR ANGIOGRAM SELECTIVE EACH ADDITIONAL VESSEL  11/07/2021   IR ANGIOGRAM SELECTIVE EACH ADDITIONAL VESSEL  11/07/2021   IR ANGIOGRAM SELECTIVE EACH ADDITIONAL VESSEL  11/28/2021   IR ANGIOGRAM VISCERAL SELECTIVE  11/07/2021   IR ANGIOGRAM VISCERAL SELECTIVE  11/28/2021   IR EMBO ARTERIAL NOT HEMORR HEMANG INC GUIDE ROADMAPPING  11/07/2021   IR EMBO TUMOR ORGAN ISCHEMIA INFARCT INC GUIDE ROADMAPPING  11/28/2021   IR RADIOLOGIST EVAL & MGMT  09/12/2021   IR US GUIDE VASC ACCESS LEFT  11/07/2021   IR US GUIDE VASC ACCESS LEFT  11/28/2021   Left heel surgery     LOWER EXTREMITY ANGIOGRAPHY N/A 07/08/2021   Procedure: Lower Extremity Angiography;  Surgeon: Cherre Robins, MD;  Location: [place] CV LAB;  Service: Cardiovascular;  Laterality: N/A;   PENILE BIOPSY N/A 03/26/2020   Procedure: PENILE ULCER DEBRIDEMENT;  Surgeon: Remi Haggard, MD;  [facility];  Service: Urology;  Laterality: N/A;  30 MINS   PERIPHERAL VASCULAR INTERVENTION Right 07/08/2021   Procedure: PERIPHERAL VASCULAR INTERVENTION;  Surgeon:  Cherre Robins, MD;  Location: [place] [facility] LAB;  Service: Cardiovascular;  Laterality: Right;   SCLEROTHERAPY  01/12/2021   Procedure: Clide Deutscher;  Surgeon: Sharyn Creamer, MD;  Location: [facility] ENDOSCOPY;  Service: Gastroenterology;;   Clide Deutscher  02/24/2021   Procedure: Clide Deutscher;  Surgeon:  [name], MD;  Location: [facility] ENDOSCOPY;  Service: Gastroenterology;;   SUBMUCOSAL TATTOO INJECTION  08/24/2021   Procedure: SUBMUCOSAL TATTOO INJECTION;  Surgeon: Ladene Artist, MD;  Location: [place];  Service: Gastroenterology;;   SUBMUCOSAL TATTOO INJECTION  09/15/2021   Procedure: SUBMUCOSAL TATTOO INJECTION;  Surgeon: Sharyn Creamer, MD;  Location: [facility] ENDOSCOPY;  Service: Gastroenterology;;     Social History:   reports that he has been smoking cigarettes. He has a 22.50 pack-year smoking history. He has never used smokeless tobacco. He reports that he does not currently use alcohol. He reports that he does not currently use drugs after having used the following drugs: "Crack" cocaine.   Family History:  His family history is negative for Colon cancer.   Allergies Allergies  Allergen Reactions   Seroquel [Quetiapine] Other (See Comments)    Tardive kinesia Dystonia    Dilaudid [Hydromorphone Hcl] Itching and Other (See Comments)    Pt reports itchiness after IM injection      Home Medications  Prior to Admission medications   Medication Sig Start Date End Date Taking? Authorizing Provider  amLODipine (NORVASC) 5 MG tablet Take 1 tablet (5 mg total) by mouth daily. Patient not taking: Reported on 12/25/2021 09/18/21 12/26/22  [place] Eric J, DO  atorvastatin (LIPITOR) 40 MG tablet Take 1 tablet (40 mg total) by mouth daily. Patient not taking: Reported on 12/25/2021 09/18/21 12/26/22  [place] Donnamarie Poag, DO  folic acid (FOLVITE) 1 MG tablet Take 1 tablet (1 mg total) by mouth daily. Patient not taking: Reported on 12/24/2021 12/12/21   Eugenie Filler, MD  gabapentin  (NEURONTIN) 300 MG capsule Take 1 capsule (300 mg total) by mouth at bedtime. Patient taking differently: Take 300 mg by mouth at bedtime as needed (for nerve pain). 11/15/21   Nita Sells, MD  Multiple Vitamin (MULTIVITAMIN WITH MINERALS) TABS tablet Take 1 tablet by mouth daily. Patient not taking: Reported on 12/24/2021 12/12/21   Eugenie Filler, MD  pantoprazole (PROTONIX) 40 MG tablet Take 1 tablet (40 mg total) by mouth 2 (two) times daily before a meal. Patient not taking: Reported on 12/25/2021 10/10/21 01/08/22  Mercy Riding, MD  thiamine (VITAMIN B1) 100 MG tablet Take 1 tablet (100 mg total) by mouth daily. Patient not taking: Reported on 12/25/2021 12/12/21   Eugenie Filler, MD  traZODone (DESYREL) 50 MG tablet Take 1 tablet (50 mg total) by mouth at bedtime as needed for sleep. Patient not taking: Reported on 12/24/2021 11/15/21   Nita Sells, MD  colchicine 0.6 MG tablet Take 0.5 tablets (0.3 mg total) by mouth 2 (two) times daily. 07/24/20 07/29/20  Noemi Chapel, MD  furosemide (LASIX) 40 MG tablet Take 1 tablet (40 mg total) by mouth daily. 07/25/19 02/09/20  Ladona Horns, MD     Critical care time: n/a     Roselie Awkward, MD [place] [facility] Pager: [phone] Cell: [phone] After 7:00 pm call [place]  [phone]

## 2021-12-25 NOTE — Consult Note (Signed)
   [facility] CM Inpatient Consult   12/25/2021  Daniel [name] [birth year] [id number]  [place] Organization [ACO] Patient: Medicare ACO REACH  Primary Care Provider:  [name], NP, [place] by [facility] NG-P of patient's hospitalization via secure chat, and details of ongoing needs/barriers regarding transportation issues [ran out of gas and couldn't go to dialysis; prothesis not fitting properly].  Patient is currently as observation status  Patient is currently active with [facility] [facility] for chronic disease management services.  Patient has been engaged by a [place]  NP-G, Kayleen Memos.  Our community based plan of care has focused on disease management and community resource support.    Met with patient at the bedside regarding concerns from [facility] NP-G.  He states that he didn't go to dialysis and he denies any needs for setting up transportation. He states, "I plan to keep on driving."  Patient should have follow up with possible OP rehab for prothesis concern. Secure chatted [facility] NP-G of findings.  Plan: Spoke with inpatient TOC RNCM,  to make aware that [place] [facility] following. Explained that patient had issues with getting to dialysis. Patient is in observation status and awaiting HD ordered noted for today.  Of note, [facility] Care Management services does not replace or interfere with any services that are needed or arranged by inpatient [facility] care management team.  For additional questions or referrals please contact:  Natividad Brood, RN BSN [facility] Liaison  [phone] business mobile phone Toll free office [phone]  Fax number: [phone] [email or web address]

## 2021-12-25 NOTE — Progress Notes (Signed)
CT chest reviewed. Suspect multi-focal PNA, will start ABX.

## 2021-12-25 NOTE — Telephone Encounter (Signed)
Verbal orders given. Nat Christen, CMA   Copied from [place] [phone]. Topic: General - Other >> Dec 25, 2021 10:38 AM Ludger Nutting wrote: [place] Verbal Orders - Caller/Agency: [place] Number: [phone] Requesting Social Work: Johnnye Sima - [facility] Frequency:

## 2021-12-25 NOTE — Telephone Encounter (Signed)
Spoke with Cam completed

## 2021-12-25 NOTE — Progress Notes (Signed)
  Carryover admission to the Day Admitter.  I discussed this case with the EDP, Dr. Christy Gentles.  Per these discussions:   This is a 61 year old male with end-stage renal disease on hemodialysis on Monday, Wednesday, Friday, who is being admitted with acute volume overload after presenting with 1 day of progressive shortness of breath associated with orthopnea, worsening of edema in the bilateral lower extremities in spite of no recently missed hemodialysis sessions, including attending his most recent session on Monday, 12/23/2021.  Of note, plain films of the chest concerning for potential adenocarcinoma, in the absence of any established diagnosis of such.   I have placed an order for observation for further evaluation management of presenting acute volume overload.   I have placed some additional preliminary admit orders via the adult multi-morbid admission order set. I have also ordered morning labs, including renal function panel, serum magnesium level, and CBC.  Will subsequently need nephrology consultation to assist with arrangement for HD today.    Babs Bertin, DO Hospitalist

## 2021-12-25 NOTE — ED Provider Triage Note (Signed)
Emergency Medicine Provider Triage Evaluation Note  Daniel [name] , a 61 y.o. male  was evaluated in triage.  Pt complains of dyspnea since yesterday, constant, worse w/ activity. Associated sharp chest pain as well as mild cough. 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   ESRD, MWF dialysis, last dialysis Monday.   Review of Systems  Per above.   Physical Exam  BP (!) 167/88 (BP Location: Right Arm)   Pulse 94   Temp 98.6 F (37 C) (Oral)   Resp 16   SpO2 96%  Gen:   Awake, no distress   Resp:  Normal effort, decreased breath sounds @ the bases MSK:   Left BKA, RLE edema.   Medical Decision Making  Medically screening exam initiated at 12:42 AM.  Appropriate orders placed.  Daniel [name] was informed that the remainder of the evaluation will be completed by another provider, this initial triage assessment does not replace that evaluation, and the importance of remaining in the ED until their evaluation is complete.  Dyspnea.    Daniel Dyke, PA-C 12/25/21 0045

## 2021-12-26 ENCOUNTER — Inpatient Hospital Stay (HOSPITAL_COMMUNITY): Payer: Medicare Other | Admitting: Certified Registered Nurse Anesthetist

## 2021-12-26 ENCOUNTER — Telehealth: Payer: Self-pay | Admitting: Pulmonary Disease

## 2021-12-26 ENCOUNTER — Observation Stay (HOSPITAL_COMMUNITY): Payer: Medicare Other

## 2021-12-26 ENCOUNTER — Telehealth (INDEPENDENT_AMBULATORY_CARE_PROVIDER_SITE_OTHER): Payer: Self-pay | Admitting: Primary Care

## 2021-12-26 ENCOUNTER — Inpatient Hospital Stay (HOSPITAL_COMMUNITY): Payer: Medicare Other

## 2021-12-26 ENCOUNTER — Telehealth: Payer: Self-pay | Admitting: *Deleted

## 2021-12-26 ENCOUNTER — Encounter (HOSPITAL_COMMUNITY)
Admission: EM | Discharge status: Against Medical Advice | Payer: Self-pay | Source: Home / Self Care | Attending: Family Medicine

## 2021-12-26 ENCOUNTER — Inpatient Hospital Stay: Payer: Medicare Other

## 2021-12-26 DIAGNOSIS — N186 End stage renal disease: Secondary | ICD-10-CM

## 2021-12-26 DIAGNOSIS — Z89512 Acquired absence of left leg below knee: Secondary | ICD-10-CM | POA: Diagnosis not present

## 2021-12-26 DIAGNOSIS — I509 Heart failure, unspecified: Secondary | ICD-10-CM

## 2021-12-26 DIAGNOSIS — I132 Hypertensive heart and chronic kidney disease with heart failure and with stage 5 chronic kidney disease, or end stage renal disease: Secondary | ICD-10-CM | POA: Diagnosis not present

## 2021-12-26 DIAGNOSIS — D61818 Other pancytopenia: Secondary | ICD-10-CM | POA: Diagnosis not present

## 2021-12-26 DIAGNOSIS — Z885 Allergy status to narcotic agent status: Secondary | ICD-10-CM | POA: Diagnosis not present

## 2021-12-26 DIAGNOSIS — E8770 Fluid overload, unspecified: Secondary | ICD-10-CM | POA: Diagnosis present

## 2021-12-26 DIAGNOSIS — E1122 Type 2 diabetes mellitus with diabetic chronic kidney disease: Secondary | ICD-10-CM

## 2021-12-26 DIAGNOSIS — I120 Hypertensive chronic kidney disease with stage 5 chronic kidney disease or end stage renal disease: Secondary | ICD-10-CM | POA: Diagnosis not present

## 2021-12-26 DIAGNOSIS — N2581 Secondary hyperparathyroidism of renal origin: Secondary | ICD-10-CM | POA: Diagnosis present

## 2021-12-26 DIAGNOSIS — E871 Hypo-osmolality and hyponatremia: Secondary | ICD-10-CM | POA: Diagnosis not present

## 2021-12-26 DIAGNOSIS — J9811 Atelectasis: Secondary | ICD-10-CM | POA: Diagnosis present

## 2021-12-26 DIAGNOSIS — D631 Anemia in chronic kidney disease: Secondary | ICD-10-CM | POA: Diagnosis not present

## 2021-12-26 DIAGNOSIS — E1151 Type 2 diabetes mellitus with diabetic peripheral angiopathy without gangrene: Secondary | ICD-10-CM

## 2021-12-26 DIAGNOSIS — Z888 Allergy status to other drugs, medicaments and biological substances status: Secondary | ICD-10-CM | POA: Diagnosis not present

## 2021-12-26 DIAGNOSIS — F17210 Nicotine dependence, cigarettes, uncomplicated: Secondary | ICD-10-CM

## 2021-12-26 DIAGNOSIS — E8779 Other fluid overload: Secondary | ICD-10-CM

## 2021-12-26 DIAGNOSIS — I5033 Acute on chronic diastolic (congestive) heart failure: Secondary | ICD-10-CM | POA: Diagnosis not present

## 2021-12-26 DIAGNOSIS — K7460 Unspecified cirrhosis of liver: Secondary | ICD-10-CM | POA: Diagnosis not present

## 2021-12-26 DIAGNOSIS — J189 Pneumonia, unspecified organism: Secondary | ICD-10-CM

## 2021-12-26 DIAGNOSIS — Z8701 Personal history of pneumonia (recurrent): Secondary | ICD-10-CM | POA: Diagnosis not present

## 2021-12-26 DIAGNOSIS — B182 Chronic viral hepatitis C: Secondary | ICD-10-CM | POA: Diagnosis present

## 2021-12-26 DIAGNOSIS — Z992 Dependence on renal dialysis: Secondary | ICD-10-CM

## 2021-12-26 DIAGNOSIS — I5031 Acute diastolic (congestive) heart failure: Secondary | ICD-10-CM

## 2021-12-26 DIAGNOSIS — I272 Pulmonary hypertension, unspecified: Secondary | ICD-10-CM | POA: Diagnosis not present

## 2021-12-26 DIAGNOSIS — Z5329 Procedure and treatment not carried out because of patient's decision for other reasons: Secondary | ICD-10-CM | POA: Diagnosis present

## 2021-12-26 DIAGNOSIS — R0602 Shortness of breath: Secondary | ICD-10-CM | POA: Diagnosis present

## 2021-12-26 DIAGNOSIS — D6959 Other secondary thrombocytopenia: Secondary | ICD-10-CM | POA: Diagnosis present

## 2021-12-26 DIAGNOSIS — C220 Liver cell carcinoma: Secondary | ICD-10-CM | POA: Diagnosis not present

## 2021-12-26 HISTORY — PX: BRONCHIAL WASHINGS: SHX5105

## 2021-12-26 HISTORY — PX: BRONCHIAL BIOPSY: SHX5109

## 2021-12-26 HISTORY — PX: VIDEO BRONCHOSCOPY: SHX5072

## 2021-12-26 LAB — BASIC METABOLIC PANEL
Anion gap: 10 (ref 5–15)
BUN: 8 mg/dL (ref 8–23)
CO2: 28 mmol/L (ref 22–32)
Calcium: 8.2 mg/dL — ABNORMAL LOW (ref 8.9–10.3)
Chloride: 95 mmol/L — ABNORMAL LOW (ref 98–111)
Creatinine, Ser: 3.67 mg/dL — ABNORMAL HIGH (ref 0.61–1.24)
GFR, Estimated: 18 mL/min — ABNORMAL LOW (ref >60)
Glucose, Bld: 87 mg/dL (ref 70–99)
Potassium: 3.7 mmol/L (ref 3.5–5.1)
Sodium: 133 mmol/L — ABNORMAL LOW (ref 135–145)

## 2021-12-26 SURGERY — BRONCHOSCOPY, WITH FLUOROSCOPY
Anesthesia: General

## 2021-12-26 MED ORDER — MIDAZOLAM HCL 2 MG/2ML IJ SOLN
INTRAMUSCULAR | Status: DC | PRN
  Administered 2021-12-26: 1 mg via INTRAVENOUS

## 2021-12-26 MED ORDER — PROPOFOL 500 MG/50ML IV EMUL
INTRAVENOUS | Status: DC | PRN
  Administered 2021-12-26: 100 ug/kg/min via INTRAVENOUS

## 2021-12-26 MED ORDER — SUGAMMADEX SODIUM 200 MG/2ML IV SOLN
INTRAVENOUS | Status: DC | PRN
  Administered 2021-12-26: 400 mg via INTRAVENOUS

## 2021-12-26 MED ORDER — PROPOFOL 10 MG/ML IV BOLUS
INTRAVENOUS | Status: DC | PRN
  Administered 2021-12-26: 100 mg via INTRAVENOUS

## 2021-12-26 MED ORDER — IPRATROPIUM-ALBUTEROL 0.5-2.5 (3) MG/3ML IN SOLN
3.0000 mL | Freq: Four times a day (QID) | RESPIRATORY_TRACT | Status: DC | PRN
  Administered 2021-12-27: 3 mL via RESPIRATORY_TRACT
  Filled 2021-12-26: qty 3

## 2021-12-26 MED ORDER — PHENYLEPHRINE HCL-NACL 20-0.9 MG/250ML-% IV SOLN
INTRAVENOUS | Status: DC | PRN
  Administered 2021-12-26: 30 ug/min via INTRAVENOUS

## 2021-12-26 MED ORDER — LIDOCAINE 2% (20 MG/ML) 5 ML SYRINGE
INTRAMUSCULAR | Status: DC | PRN
  Administered 2021-12-26: 20 mg via INTRAVENOUS

## 2021-12-26 MED ORDER — ROCURONIUM BROMIDE 10 MG/ML (PF) SYRINGE
PREFILLED_SYRINGE | INTRAVENOUS | Status: DC | PRN
  Administered 2021-12-26: 70 mg via INTRAVENOUS

## 2021-12-26 MED ORDER — ONDANSETRON HCL 4 MG/2ML IJ SOLN
INTRAMUSCULAR | Status: DC | PRN
  Administered 2021-12-26: 4 mg via INTRAVENOUS

## 2021-12-26 MED ORDER — DEXAMETHASONE SODIUM PHOSPHATE 10 MG/ML IJ SOLN
INTRAMUSCULAR | Status: DC | PRN
  Administered 2021-12-26: 5 mg via INTRAVENOUS

## 2021-12-26 MED ORDER — CHLORHEXIDINE GLUCONATE CLOTH 2 % EX PADS
6.0000 | MEDICATED_PAD | Freq: Every day | CUTANEOUS | Status: DC
  Administered 2021-12-26: 6 via TOPICAL

## 2021-12-26 MED ORDER — FENTANYL CITRATE (PF) 100 MCG/2ML IJ SOLN
INTRAMUSCULAR | Status: DC | PRN
  Administered 2021-12-26: 100 ug via INTRAVENOUS

## 2021-12-26 NOTE — Progress Notes (Signed)
[facility] Progress Note   Subjective:   Tolerated HD yesterday with net UF 4.5L. Reports he is feeling much better, breathing is improved but still on O2. Edema improved as well. Denies CP, dizziness, abdominal pain and nausea.   Objective Vitals:   12/26/21 0050 12/26/21 0134 12/26/21 0501 12/26/21 0719  BP: (!) 153/64 (!) 158/100 (!) 155/76 (!) 174/95  Pulse: 77 82 88 81  Resp: '15 15 18 18  '$ Temp: 97.7 F (36.5 C)  98.5 F (36.9 C) 98 F (36.7 C)  TempSrc: Oral Oral  Oral  SpO2: 100% 100% 96% 97%  Weight: 100.5 kg     Height:       Physical Exam General: Alert male in NAD Heart: RRR, no murmurs, rubs or gallops Lungs: Diminished breath sounds b/l bases but no rhonchi or wheezing  Abdomen: Soft, non-distended, +BS Extremities: Trace pedal edema bilateral lower extremities Dialysis Access: AVF +bruit  Additional Objective Labs: Basic Metabolic Panel: Recent Labs  Lab 12/25/21 0046 12/25/21 0700 12/26/21 0457  NA 127* 126* 133*  K 3.2* 3.4* 3.7  CL 88* 87* 95*  CO2 '25 24 28  '$ GLUCOSE 80 85 87  BUN '15 16 8  '$ CREATININE 5.96* 6.21* 3.67*  CALCIUM 8.4* 8.6* 8.2*  PHOS  --  3.8  --    Liver Function Tests: Recent Labs  Lab 12/20/21 0645 12/25/21 0046 12/25/21 0700  AST 48* 49*  --   ALT 15 14  --   ALKPHOS 108 104  --   BILITOT 1.3* 1.4*  --   PROT 7.8 8.0  --   ALBUMIN 2.3* 2.3* 2.3*   No results for input(s): "LIPASE", "AMYLASE" in the last 168 hours. CBC: Recent Labs  Lab 12/20/21 0645 12/25/21 0046 12/25/21 0700  WBC 3.2* 2.7* 2.8*  NEUTROABS  --  1.7 1.5*  HGB 8.2* 8.7* 8.7*  HCT 23.7* 25.2* 25.6*  MCV 94.0 94.0 96.6  PLT 92* 115* 115*   Blood Culture    Component Value Date/Time   SDES BLOOD BLOOD RIGHT HAND 08/16/2021 2049   SPECREQUEST  08/16/2021 2049    BOTTLES DRAWN AEROBIC AND ANAEROBIC Blood Culture adequate volume   CULT  08/16/2021 2049    NO GROWTH 5 DAYS Performed at [facility] [address],  [place], [place] [address]    REPTSTATUS 08/21/2021 FINAL 08/16/2021 2049    Cardiac Enzymes: No results for input(s): "CKTOTAL", "CKMB", "CKMBINDEX", "TROPONINI" in the last 168 hours. CBG: No results for input(s): "GLUCAP" in the last 168 hours. Iron Studies: No results for input(s): "IRON", "TIBC", "TRANSFERRIN", "FERRITIN" in the last 72 hours. '@lablastinr3'$ @ Studies/Results: DG CHEST PORT 1 VIEW  Result Date: 12/26/2021 CLINICAL DATA:  Encounter for CHF EXAM: PORTABLE CHEST 1 VIEW COMPARISON:  Portable exam 0516 hours compared to 12/25/2021 Correlation: CT chest 12/25/2021 FINDINGS: Enlargement of cardiac silhouette with pulmonary vascular congestion. Atherosclerotic calcification aorta. Enlargement of RIGHT hilum, question adenopathy, hila poorly assessed on prior CT due to lack of IV contrast. Mid lung infiltrates bilaterally favoring multifocal pneumonia. Small RIGHT pleural effusion and mild RIGHT basilar atelectasis. No acute osseous findings or pneumothorax. Vascular stent LEFT axilla. IMPRESSION: BILATERAL pulmonary infiltrates favoring multifocal pneumonia. Small RIGHT pleural effusion and basilar atelectasis. Enlarged RIGHT pulmonary hilum question adenopathy versus perihilar infiltrate. Aortic Atherosclerosis (ICD10-I70.0). Electronically Signed   By: Lavonia Dana M.D.   On: 12/26/2021 07:57   CT CHEST WO CONTRAST  Result Date: 12/25/2021 CLINICAL DATA:  Evaluate pneumonia. History  of multifocal hepatocellular carcinoma. EXAM: CT CHEST WITHOUT CONTRAST TECHNIQUE: Multidetector CT imaging of the chest was performed following the standard protocol without IV contrast. RADIATION DOSE REDUCTION: This exam was performed according to the departmental dose-optimization program which includes automated exposure control, adjustment of the mA and/or kV according to patient size and/or use of iterative reconstruction technique. COMPARISON:  CT 10/09/2021 FINDINGS: Cardiovascular: Mild cardiac  enlargement. Aortic atherosclerosis. Coronary artery calcifications. No pericardial effusion. Mediastinum/Nodes: Normal appearance of the thyroid gland. The trachea appears patent and is midline. Normal appearance of the esophagus. No enlarged axillary lymph nodes. Left lower paratracheal lymph node measures 1.5 cm in short axis. Previously 1 cm. Hilar lymph nodes are suboptimally evaluated due to lack of IV contrast. Lungs/Pleura: Small to moderate volume right pleural effusion is increased from the previous exam. There is a small left pleural effusion which is also increased in volume from previous exam. Multifocal bilateral airspace disease is identified involving the posterior left upper lobe, lingula, left lower lobe, or right lower lobe and central right middle lobe. Mild peribronchovascular airspace and ground-glass densities noted in the right upper lobe. The no pneumothorax identified. No suspicious pulmonary nodule or mass identified. Upper Abdomen: The liver is cirrhotic. There is perihepatic ascites noted. Known, treated central liver lesion is incompletely characterized on today's study without IV contrast. This measures approximately 4.9 x 4.1 cm, image 125/3. Musculoskeletal: No chest wall mass or suspicious bone lesions identified. IMPRESSION: 1. Multifocal bilateral airspace disease is identified compatible with multifocal pneumonia. 2. Bilateral pleural effusions are increased in volume from previous exam. 3. Cirrhosis with stigmata of portal venous hypertension including ascites. 4. Known central liver lesion is incompletely characterized on today's study without IV contrast. 5. Coronary artery calcifications. 6. Aortic Atherosclerosis (ICD10-I70.0). Electronically Signed   By: Kerby Moors M.D.   On: 12/25/2021 08:59   DG Chest 2 View  Result Date: 12/25/2021 CLINICAL DATA:  Chest pain, shortness of breath, EXAM: CHEST - 2 VIEW COMPARISON:  Chest radiograph dated 12/20/2021. CT chest dated  10/09/2021. FINDINGS: Multifocal patchy opacities in the lingula and bilateral lower lobes. Notably, while progressive, this was present on prior CT chest dated 10/09/2021. 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 Small right pleural effusion. Mild cardiomegaly.  Thoracic aortic atherosclerosis. IMPRESSION: Multifocal patchy opacities in the lingula and bilateral lower lobes, possibly reflecting multifocal pneumonia, but raising concern for multifocal semi invasive adenocarcinoma given persistence. Pulmonology consultation is suggested. Small right pleural effusion. Electronically Signed   By: Julian Hy M.D.   On: 12/25/2021 01:24   Medications:  sodium chloride     cefTRIAXone (ROCEPHIN)  IV 1 g (12/25/21 1025)   promethazine (PHENERGAN) injection (IM or IVPB)      carvedilol  3.125 mg Oral BID WC   Chlorhexidine Gluconate Cloth  6 each Topical Q0600   doxycycline  100 mg Oral Q12H   guaiFENesin  1,200 mg Oral BID   heparin  5,000 Units Subcutaneous Q8H   ipratropium-albuterol  3 mL Nebulization Q6H   sodium chloride flush  3 mL Intravenous Q12H    Dialysis Orders: Center: [facility]  on MWF . [facility] 17H, 4 hours, BFR 450, DFR 500, EDW 101kg, 2K 2Ca AVF 15g  No heparin Sodium thiosulfate 25g IV q HD  Assessment/Plan:  Shortness of breath: Chest CT suggestive of multifocal pneumonia however based on clinical exam, suspect volume overload as well. Symptoms did  improve with HD. Noted pulmonology is planning a bronchoscopy due to high risk of bronchogenic carcinoma  ESRD:  HD on MWF schedule, has had some missed HD and ED HD treatments over the past several weeks. Continue MWF schedule here, next HD tomorrow  Hypertension: BP elevated, likely worsened by volume overload. Max UF with HD again tomorrow.   Anemia: Hb 8.7. Hx recurrent GI bleeds but no known bleeding at  present. Does not get ESA due to hepatocellular carcinoma  Metabolic bone disease: Calcium and phos controlled. Not on VDRA or binders  Nutrition:  Albumin low, also w/history of liver disease. Recommend protein supplements Hypokalemia: Mild, will use 4K bath with HD Hepatocellular carcinoma: Needs oncology follow up  Anice Paganini, PA-C 12/26/2021, 8:30 AM  [facility] Pager: [phone]

## 2021-12-26 NOTE — Progress Notes (Signed)
Heart Failure Navigator Progress Note  Assessed for Heart & Vascular TOC clinic readiness.  Patient does not meet criteria due to currently on IHD, followed by nephrology.   HF Navigation team will sign-off.   Pricilla Holm, MSN, RN

## 2021-12-26 NOTE — Progress Notes (Signed)
Pt receives out-pt HD at [facility] on MWF. Pt arrives at 11:00 for 11:15 chair time. Will assist as needed.   Melven Sartorius Renal Navigator [phone]

## 2021-12-26 NOTE — Op Note (Signed)
[facility] Cardiopulmonary Patient Name: Daniel [name] Date: 12/26/2021 MRN: [id number] Attending MD: Juanito Doom , MD Date of Birth: 1961/01/02 CSN: Finalized Age: 61 Admit Type: Inpatient Gender: Male Procedure:             Bronchoscopy Indications:           Bilateral atelectasis, Persistent atelectasis Providers:             Nathaneil Canary B. Lake Bells, MD, Glori Bickers, RN, Darliss Cheney,                         Technician Referring MD:           Medicines:             General Anesthesia Complications:         No immediate complications Estimated Blood Loss:  Estimated blood loss was minimal. Procedure:             Pre-Anesthesia Assessment:                        - A History and Physical has been performed. Patient                         meds and allergies have been reviewed. The risks and                         benefits of the procedure and the sedation options and                         risks were discussed with the patient. All questions                         were answered and informed consent was obtained.                         Patient identification and proposed procedure were                         verified prior to the procedure by the physician and                         the nurse in the pre-procedure area. Mental Status                         Examination: normal. Airway Examination: normal                         oropharyngeal airway. Respiratory Examination: clear                         to auscultation. CV Examination: normal. ASA Grade                         Assessment: III - A patient with severe systemic                         disease. After reviewing the risks and benefits, the  patient was deemed in satisfactory condition to                         undergo the procedure. The anesthesia plan was to use                         general anesthesia. Immediately prior to                         administration  of medications, the patient was                         re-assessed for adequacy to receive sedatives. The                         heart rate, respiratory rate, oxygen saturations,                         blood pressure, adequacy of pulmonary ventilation, and                         response to care were monitored throughout the                         procedure. The physical status of the patient was                         re-assessed after the procedure.                        After obtaining informed consent, the bronchoscope was                         passed under direct vision. Throughout the procedure,                         the patient's blood pressure, pulse, and oxygen                         saturations were monitored continuously. the [id number]                         )[id number]) Olympus broncoscope was introduced through                         the mouth, via the endotracheal tube and advanced to                         the tracheobronchial tree. The procedure was                         accomplished without difficulty. The patient tolerated                         the procedure well. The total duration of the                         procedure was 15 minutes. Scope In: Scope Out: Findings:      The nasopharynx/oropharynx  appears normal. The larynx appears normal.       The vocal cords appear normal. The subglottic space is normal. The       trachea is of normal caliber. The carina is sharp. The tracheobronchial       tree of the right lung was examined to at least the first subsegmental       level. Bronchial mucosa and anatomy in the right lung are normal; there       are no endobronchial lesions, and no secretions.      Left Lung Abnormalities: Scant, bloody secretions were found in the left       upper lobe. They were not obstructing the airway. Transbronchial       biopsies were performed in the anterior segment of the left upper lobe       and in the superior lingula  segment of the left upper lobe using forceps       and sent for histopathology examination. The procedure was guided by       fluoroscopy. Transbronchial biopsy technique was selected because the       sampling site was not accessible using standard endoscopic       (bronchoscopic) techniques. Ten biopsy passes were performed. Ten biopsy       samples were obtained. BAL was performed in the lingula of the lung and       sent for routine cytology and bacterial, AFB and fungal analysis. 50 mL       of fluid were instilled. 22 mL were returned. The return was       blood-tinged. There were no mucoid plugs in the return fluid. Impression:            - Bilateral atelectasis                        - Persistent atelectasis                        - The airway examination of the right lung was normal.                        - Scant, bloody secretions were found in the left                         upper lobe.                        - Transbronchial lung biopsies were performed.                        - Bronchoalveolar lavage was performed. Moderate Sedation:      General Anesthesia Recommendation:        - Await BAL and biopsy results. Procedure Code(s):     --- Professional ---                        [phone], Bronchoscopy, rigid or flexible, including                         fluoroscopic guidance, when performed; with                         transbronchial lung biopsy(s), single lobe  [id number], Bronchoscopy, rigid or flexible, including                         fluoroscopic guidance, when performed; with bronchial                         alveolar lavage Diagnosis Code(s):     --- Professional ---                        J98.11, Atelectasis                        R09.89, Other specified symptoms and signs involving                         the circulatory and respiratory systems CPT copyright 2019 American Medical Association. All rights reserved. The codes documented in this  report are preliminary and upon coder review may  be revised to meet current compliance requirements. Norlene Campbell, MD Juanito Doom, MD 12/26/2021 3:57:47 PM This report has been signed electronically. Number of Addenda: 0

## 2021-12-26 NOTE — Anesthesia Postprocedure Evaluation (Signed)
Anesthesia Post Note  Patient: Leslye Peer Mette  Procedure(s) Performed: VIDEO BRONCHOSCOPY WITH FLUORO BRONCHIAL BIOPSIES BRONCHIAL WASHINGS     Patient location during evaluation: PACU Anesthesia Type: General Level of consciousness: awake and alert, patient cooperative and oriented Pain management: pain level controlled Vital Signs Assessment: post-procedure vital signs reviewed and stable Respiratory status: spontaneous breathing, nonlabored ventilation and respiratory function stable Cardiovascular status: blood pressure returned to baseline and stable Postop Assessment: no apparent nausea or vomiting Anesthetic complications: no   No notable events documented.  Last Vitals:  Vitals:   12/26/21 1630 12/26/21 1700  BP: 133/73 (!) 156/63  Pulse: 82 78  Resp: 20 18  Temp:  36.7 C  SpO2: 92% 94%    Last Pain:  Vitals:   12/26/21 1630  TempSrc:   PainSc: 0-No pain                 Keaundra Stehle,E. Deazia Lampi

## 2021-12-26 NOTE — Telephone Encounter (Signed)
     Patient  visit at [facility]  on 12/20/2021  was for nausea  Have you been able to follow up with your primary care physician? patient is now in hosp as he previously left without being seen  The patient [facility] [address] , [place] [address] Email : [email or web address]

## 2021-12-26 NOTE — H&P (Signed)
[facility] PCCM  CC: dyspnea HPI 61 y/o male with a complex past medical history admitted for dyspnea in setting of massive volume overload.  Noted on admission to have persistent infiltrates in his lungs bilaterally which have been present off and on for the last 6-8 months and has been followed by Drs. Lightfoot and Icard in the past.    Past Medical History:  Diagnosis Date   Anemia    Diabetes mellitus without complication ([place])    patient denies   Dialysis patient [facility])    End stage chronic kidney disease ([place])    Hypertension    ICH (intracerebral hemorrhage) ([place]) 05/20/2017   PAD (peripheral artery disease) ([facility])    Shoulder pain, left 06/28/2013     Family History  Problem Relation Age of Onset   Colon cancer Neg Hx      Social History   Socioeconomic History   Marital status: Single    Spouse name: Not on file   Number of children: 2   Years of education: 12   Highest education level: 12th grade  Occupational History   Occupation: disabled  Tobacco Use   Smoking status: Every Day    Packs/day: 0.50    Years: 45.00    Total pack years: 22.50    Types: Cigarettes   Smokeless tobacco: Never   Tobacco comments:    Pt left before information given  Vaping Use   Vaping Use: Never used  Substance and Sexual Activity   Alcohol use: Not Currently   Drug use: Not Currently    Types: "Crack" cocaine    Comment: last in 2020   Sexual activity: Yes    Birth control/protection: None  Other Topics Concern   Not on file  Social History Narrative   Goes to dialysis M-W-F, [facility] using a wheelchair at present, waiting for prosthetic. Looking for handicapped housing for immediate move in.   Social Determinants of Health   Financial Resource Strain: High Risk (11/01/2021)   Overall Financial Resource Strain (CARDIA)    Difficulty of Paying Living Expenses: Hard  Food Insecurity: No Food Insecurity (12/25/2021)   Hunger Vital Sign    Worried About Running Out of Food in the  Last Year: Never true    [place] in the Last Year: Never true  Transportation Needs: Unmet Transportation Needs (12/25/2021)   PRAPARE - Transportation    Lack of Transportation (Medical): Yes    Lack of Transportation (Non-Medical): Yes  Physical Activity: Insufficiently Active (11/01/2021)   Exercise Vital Sign    Days of Exercise per Week: 1 day    Minutes of Exercise per Session: 20 min  Stress: No Stress Concern Present (11/01/2021)   [place]    Feeling of Stress : Not at all  Social Connections: Socially Isolated (11/01/2021)   Social Connection and Isolation Panel [NHANES]    Frequency of Communication with Friends and Family: More than three times a week    Frequency of Social Gatherings with Friends and Family: More than three times a week    Attends Religious Services: Never    Marine scientist or Organizations: No    Attends Archivist Meetings: Never    Marital Status: Divorced  Human resources officer Violence: Not At Risk (11/01/2021)   Humiliation, Afraid, Rape, and Kick questionnaire    Fear of Current or Ex-Partner: No    Emotionally Abused: No    Physically Abused:  No    Sexually Abused: No     Allergies  Allergen Reactions   Seroquel [Quetiapine] Other (See Comments)    Tardive kinesia Dystonia    Dilaudid [Hydromorphone Hcl] Itching and Other (See Comments)    Pt reports itchiness after IM injection      _0 @  Vitals:   12/26/21 0719 12/26/21 0837 12/26/21 0900 12/26/21 1445  BP: (!) 174/95  (!) 149/77 (!) 156/86  Pulse: 81  84 79  Resp: 18  17 (!) 21  Temp: 98 F (36.7 C)  98.7 F (37.1 C) 98.4 F (36.9 C)  TempSrc: Oral  Oral Oral  SpO2: 97% 98% 94% 96%  Weight:      Height:       General:  Resting comfortably in bed HENT: NCAT OP clear PULM: CTA B, normal effort CV: RRR, no mgr GI: BS+, soft, nontender MSK: normal bulk and tone Neuro: awake, alert,  no distress, MAEW   CBC    Component Value Date/Time   WBC 2.8 (L) 12/25/2021 0700   RBC 2.65 (L) 12/25/2021 0700   HGB 8.7 (L) 12/25/2021 0700   HGB 9.0 (L) 10/03/2021 0900   HGB 8.4 (L) 08/22/2020 1141   HCT 25.6 (L) 12/25/2021 0700   HCT 25.0 (L) 08/22/2020 1141   PLT 115 (L) 12/25/2021 0700   PLT 142 (L) 10/03/2021 0900   PLT 184 08/22/2020 1141   MCV 96.6 12/25/2021 0700   MCV 94 08/22/2020 1141   MCH 32.8 12/25/2021 0700   MCHC 34.0 12/25/2021 0700   RDW 18.1 (H) 12/25/2021 0700   RDW 16.7 (H) 08/22/2020 1141   LYMPHSABS 0.6 (L) 12/25/2021 0700   LYMPHSABS 1.8 08/22/2020 1141   MONOABS 0.6 12/25/2021 0700   EOSABS 0.0 12/25/2021 0700   EOSABS 0.3 08/22/2020 1141   BASOSABS 0.0 12/25/2021 0700   BASOSABS 0.0 08/22/2020 1141   BMET    Component Value Date/Time   NA 133 (L) 12/26/2021 0457   NA 138 08/22/2020 1141   K 3.7 12/26/2021 0457   CL 95 (L) 12/26/2021 0457   CO2 28 12/26/2021 0457   GLUCOSE 87 12/26/2021 0457   BUN 8 12/26/2021 0457   BUN 20 08/22/2020 1141   CREATININE 3.67 (H) 12/26/2021 0457   CREATININE 8.09 (HH) 10/03/2021 0900   CALCIUM 8.2 (L) 12/26/2021 0457   CALCIUM 7.9 (L) 08/01/2019 0702   EGFR 9 (L) 08/22/2020 1141   GFRNONAA 18 (L) 12/26/2021 0457   GFRNONAA 7 (L) 10/03/2021 0900    Impression: Persistent bilateral infiltrates in a smoker, concern for malignancy Dyspnea ESRD Dialysis Pulmonary hypertension  Plan: Bronchoscopy with biopsy today, transbronchial biopsy and BAL under general anesthesia  Roselie Awkward, MD [place] [facility] Pager: [phone] Cell: [phone] After 7:00 pm call Elink  [phone]

## 2021-12-26 NOTE — Progress Notes (Signed)
PROGRESS NOTE    Daniel [name]  YIF:[id number] DOB: 1960-11-16 DOA: 12/25/2021 PCP: [name], NP   Brief Narrative: No notes on file   Assessment and Plan:  Acute on chronic HFpEF Patient with significant dyspnea on admission. Initially treated with IV Lasix but symptoms ultimately improved after receiving hemodialysis. -Continue fluid management per nephrology via hemodialysis  Dyspnea Significant dyspnea and patient placed on up to 4 L/min. No documented hypoxia. Symptoms improved today since receiving hemodialysis. -Wean to room air  Bilateral lung infiltrates Multifocal pneumonia Concerning for infection vs antiinflammatory vs malignant etiology. Pulmonology consulted on admission and have recommended a bronchoscopy. Patient started empirically on treatment for community acquired pneumonia. -Continue Ceftriaxone and doxycycline -Follow-up pulmonology recommendations  Moderate/severe pulmonary hypertension Noted. Likely contributing to above.  Liver cirrhosis Noted. Currently on lasix and fluid is primarily managed by hemodialysis.  Primary hypertension Patient is on Coreg and amlodipine (listed as not taking) as an outpatient. -Continue Coreg and fluid management via HD  Hepatocellular cancer Known history. Patient is established with [name], medical oncology.  ESRD on HD Nephrology consulted.  Chronic hyponatremia Management via HD  Tobacco use Cessation discussed  Pancytopenia Chronic since recently. Thrombocytopenia likely related to liver disease and anemia likely related to kidney disease.    DVT prophylaxis: Heparin subq Code Status:   Code Status: Full Code Family Communication: None at bedside Disposition Plan: Discharge home likely in 1-3 days pending ability to wean oxygen to room air and specialist recommendations   Consultants:  Nephrology Pulmonology  Procedures:   Hemodialysis  Antimicrobials: Ceftriaxone Doxycycline    Subjective: Patient reports some shortness of breath with some non-productive coughing. Some mild chest pain. No nausea or vomiting.  Objective: BP (!) 149/77 (BP Location: Right Arm)   Pulse 84   Temp 98.7 F (37.1 C) (Oral)   Resp 17   Ht '5\' 11"'$  (1.803 m)   Wt 100.5 kg   SpO2 94%   BMI 30.90 kg/m   Examination:  General exam: Appears calm and comfortable Respiratory system: Diffuse rales with diminished breath sounds in RLL. Respiratory effort normal. Cardiovascular system: S1 & S2 heard, RRR. No murmurs, rubs, gallops or clicks. Gastrointestinal system: Abdomen is nondistended, soft and nontender. No organomegaly or masses felt. Normal bowel sounds heard. Central nervous system: Alert and oriented. No focal neurological deficits. Musculoskeletal: No edema. No calf tenderness Skin: No cyanosis. No rashes Psychiatry: Judgement and insight appear normal. Mood & affect appropriate.    Data Reviewed: I have personally reviewed following labs and imaging studies  CBC Lab Results  Component Value Date   WBC 2.8 (L) 12/25/2021   RBC 2.65 (L) 12/25/2021   HGB 8.7 (L) 12/25/2021   HCT 25.6 (L) 12/25/2021   MCV 96.6 12/25/2021   MCH 32.8 12/25/2021   PLT 115 (L) 12/25/2021   MCHC 34.0 12/25/2021   RDW 18.1 (H) 12/25/2021   LYMPHSABS 0.6 (L) 12/25/2021   MONOABS 0.6 12/25/2021   EOSABS 0.0 12/25/2021   BASOSABS 0.0 86/76/7209     Last metabolic panel Lab Results  Component Value Date   NA 133 (L) 12/26/2021   K 3.7 12/26/2021   CL 95 (L) 12/26/2021   CO2 28 12/26/2021   BUN 8 12/26/2021   CREATININE 3.67 (H) 12/26/2021   GLUCOSE 87 12/26/2021   GFRNONAA 18 (L) 12/26/2021   GFRAA 7 (L) 12/28/2019   CALCIUM 8.2 (L) 12/26/2021   PHOS 3.8 12/25/2021   PROT 8.0 12/25/2021  ALBUMIN 2.3 (L) 12/25/2021   LABGLOB 4.5 12/28/2019   AGRATIO 0.9 (L) 12/28/2019   BILITOT 1.4 (H) 12/25/2021   ALKPHOS 104  12/25/2021   AST 49 (H) 12/25/2021   ALT 14 12/25/2021   ANIONGAP 10 12/26/2021    GFR: Estimated Creatinine Clearance: 25.2 mL/min (A) (by C-G formula based on SCr of 3.67 mg/dL (H)).  Recent Results (from the past 240 hour(s))  Resp Panel by RT-PCR (Flu A&B, Covid) Anterior Nasal Swab     Status: None   Collection Time: 12/20/21  5:46 AM   Specimen: Anterior Nasal Swab  Result Value Ref Range Status   SARS Coronavirus 2 by RT PCR NEGATIVE NEGATIVE Final    Comment: (NOTE) SARS-CoV-2 target nucleic acids are NOT DETECTED.  The SARS-CoV-2 RNA is generally detectable in upper respiratory specimens during the acute phase of infection. The lowest concentration of SARS-CoV-2 viral copies this assay can detect is 138 copies/mL. A negative result does not preclude SARS-Cov-2 infection and should not be used as the sole basis for treatment or other patient management decisions. A negative result may occur with  improper specimen collection/handling, submission of specimen other than nasopharyngeal swab, presence of viral mutation(s) within the areas targeted by this assay, and inadequate number of viral copies(<138 copies/mL). A negative result must be combined with clinical observations, patient history, and epidemiological information. The expected result is Negative.  Fact Sheet for Patients:  EntrepreneurPulse.com.au  Fact Sheet for Healthcare Providers:  IncredibleEmployment.be  This test is no t yet approved or cleared by the [place] FDA and  has been authorized for detection and/or diagnosis of SARS-CoV-2 by FDA under an Emergency Use Authorization (EUA). This EUA will remain  in effect (meaning this test can be used) for the duration of the COVID-19 declaration under Section 564(b)(1) of the Act, 21 U.S.C.section 360bbb-3(b)(1), unless the authorization is terminated  or revoked sooner.       Influenza A by PCR NEGATIVE NEGATIVE  Final   Influenza B by PCR NEGATIVE NEGATIVE Final    Comment: (NOTE) The Xpert Xpress SARS-CoV-2/FLU/RSV plus assay is intended as an aid in the diagnosis of influenza from Nasopharyngeal swab specimens and should not be used as a sole basis for treatment. Nasal washings and aspirates are unacceptable for Xpert Xpress SARS-CoV-2/FLU/RSV testing.  Fact Sheet for Patients: EntrepreneurPulse.com.au  Fact Sheet for Healthcare Providers: IncredibleEmployment.be  This test is not yet approved or cleared by the [place] FDA and has been authorized for detection and/or diagnosis of SARS-CoV-2 by FDA under an Emergency Use Authorization (EUA). This EUA will remain in effect (meaning this test can be used) for the duration of the COVID-19 declaration under Section 564(b)(1) of the Act, 21 U.S.C. section 360bbb-3(b)(1), unless the authorization is terminated or revoked.  Performed at [facility], [place] [address] [place], [place] [address]       Radiology Studies: DG CHEST PORT 1 VIEW  Result Date: 12/26/2021 CLINICAL DATA:  Encounter for CHF EXAM: PORTABLE CHEST 1 VIEW COMPARISON:  Portable exam 0516 hours compared to 12/25/2021 Correlation: CT chest 12/25/2021 FINDINGS: Enlargement of cardiac silhouette with pulmonary vascular congestion. Atherosclerotic calcification aorta. Enlargement of RIGHT hilum, question adenopathy, hila poorly assessed on prior CT due to lack of IV contrast. Mid lung infiltrates bilaterally favoring multifocal pneumonia. Small RIGHT pleural effusion and mild RIGHT basilar atelectasis. No acute osseous findings or pneumothorax. Vascular stent LEFT axilla. IMPRESSION: BILATERAL pulmonary infiltrates favoring multifocal pneumonia. Small RIGHT pleural effusion  and basilar atelectasis. Enlarged RIGHT pulmonary hilum question adenopathy versus perihilar infiltrate. Aortic Atherosclerosis (ICD10-I70.0). Electronically Signed    By: Lavonia Dana M.D.   On: 12/26/2021 07:57   CT CHEST WO CONTRAST  Result Date: 12/25/2021 CLINICAL DATA:  Evaluate pneumonia. History of multifocal hepatocellular carcinoma. EXAM: CT CHEST WITHOUT CONTRAST TECHNIQUE: Multidetector CT imaging of the chest was performed following the standard protocol without IV contrast. RADIATION DOSE REDUCTION: This exam was performed according to the departmental dose-optimization program which includes automated exposure control, adjustment of the mA and/or kV according to patient size and/or use of iterative reconstruction technique. COMPARISON:  CT 10/09/2021 FINDINGS: Cardiovascular: Mild cardiac enlargement. Aortic atherosclerosis. Coronary artery calcifications. No pericardial effusion. Mediastinum/Nodes: Normal appearance of the thyroid gland. The trachea appears patent and is midline. Normal appearance of the esophagus. No enlarged axillary lymph nodes. Left lower paratracheal lymph node measures 1.5 cm in short axis. Previously 1 cm. Hilar lymph nodes are suboptimally evaluated due to lack of IV contrast. Lungs/Pleura: Small to moderate volume right pleural effusion is increased from the previous exam. There is a small left pleural effusion which is also increased in volume from previous exam. Multifocal bilateral airspace disease is identified involving the posterior left upper lobe, lingula, left lower lobe, or right lower lobe and central right middle lobe. Mild peribronchovascular airspace and ground-glass densities noted in the right upper lobe. The no pneumothorax identified. No suspicious pulmonary nodule or mass identified. Upper Abdomen: The liver is cirrhotic. There is perihepatic ascites noted. Known, treated central liver lesion is incompletely characterized on today's study without IV contrast. This measures approximately 4.9 x 4.1 cm, image 125/3. Musculoskeletal: No chest wall mass or suspicious bone lesions identified. IMPRESSION: 1. Multifocal  bilateral airspace disease is identified compatible with multifocal pneumonia. 2. Bilateral pleural effusions are increased in volume from previous exam. 3. Cirrhosis with stigmata of portal venous hypertension including ascites. 4. Known central liver lesion is incompletely characterized on today's study without IV contrast. 5. Coronary artery calcifications. 6. Aortic Atherosclerosis (ICD10-I70.0). Electronically Signed   By: Kerby Moors M.D.   On: 12/25/2021 08:59   DG Chest 2 View  Result Date: 12/25/2021 CLINICAL DATA:  Chest pain, shortness of breath, EXAM: CHEST - 2 VIEW COMPARISON:  Chest radiograph dated 12/20/2021. CT chest dated 10/09/2021. FINDINGS: Multifocal patchy opacities in the lingula and bilateral lower lobes. Notably, while progressive, this was present on prior CT chest dated 10/09/2021. 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 Small right pleural effusion. Mild cardiomegaly.  Thoracic aortic atherosclerosis. IMPRESSION: Multifocal patchy opacities in the lingula and bilateral lower lobes, possibly reflecting multifocal pneumonia, but raising concern for multifocal semi invasive adenocarcinoma given persistence. Pulmonology consultation is suggested. Small right pleural effusion. Electronically Signed   By: Julian Hy M.D.   On: 12/25/2021 01:24      LOS: 0 days    Cordelia Poche, MD [facility] 12/26/2021, 10:42 AM   If 7PM-7AM, please contact night-coverage [email or web address]

## 2021-12-26 NOTE — Anesthesia Procedure Notes (Signed)
Procedure Name: Intubation Date/Time: 12/26/2021 3:29 PM  Performed by: Lorie Phenix, CRNAPre-anesthesia Checklist: Patient identified, Emergency Drugs available, Suction available and Patient being monitored Patient Re-evaluated:Patient Re-evaluated prior to induction Oxygen Delivery Method: Circle system utilized Preoxygenation: Pre-oxygenation with 100% oxygen Induction Type: IV induction Ventilation: Mask ventilation without difficulty Laryngoscope Size: Mac and 4 Grade View: Grade II Tube type: Oral Tube size: 8.5 mm Number of attempts: 1 Airway Equipment and Method: Stylet Placement Confirmation: ETT inserted through vocal cords under direct vision, positive ETCO2 and breath sounds checked- equal and bilateral Secured at: 23 cm Tube secured with: Tape Dental Injury: Teeth and Oropharynx as per pre-operative assessment

## 2021-12-26 NOTE — TOC Initial Note (Addendum)
Transition of Care [facility]) - Initial/Assessment Note    Patient Details  Name: Daniel [name] MRN: [id number] Date of Birth: 02-27-1961  [facility] CM/SW Contact:    Tom-Johnson, Renea Ee, RN Phone Number: 12/26/2021, 5:15 PM  Clinical Narrative:                  Patient is admitted with Volume Overload.  Recently discharged from the hospital for Bleeding on 12/10/21. Has hx of ESRD and scheduled outpatient dialysis days are MWF. Patient states he drives himself and declines any transportation assistance. Has a lt BKA ad uses a wheelchair,states he is waiting for his prosthetic. Had Bronchoscopy with Transbronchial lung biopsies done today 12/26/21. From home alone, states he does not need any assistance at this time. States he drives himself and cook his own food. Will drive self home at discharge.  CM will continue to follow with needs as patient progresses with care.    Expected Discharge Plan: Home/Self Care Barriers to Discharge: Continued Medical Work up   Patient Goals and CMS Choice Patient states their goals for this hospitalization and ongoing recovery are:: To return home CMS Medicare.gov Compare Post Acute Care list provided to:: Patient    Expected Discharge Plan and Services Expected Discharge Plan: Home/Self Care   Discharge Planning Services: CM Consult   Living arrangements for the past 2 months: Single Family Home                                      Prior Living Arrangements/Services Living arrangements for the past 2 months: Single Family Home Lives with:: Self Patient language and need for interpreter reviewed:: Yes Do you feel safe going back to the place where you live?: Yes      Need for Family Participation in Patient Care: Yes (Comment) Care giver support system in place?: Yes (comment)   Criminal Activity/Legal Involvement Pertinent to Current Situation/Hospitalization: No - Comment as needed  Activities of Daily  Living Home Assistive Devices/Equipment: Wheelchair ADL Screening (condition at time of admission) Patient's cognitive ability adequate to safely complete daily activities?: No Is the patient deaf or have difficulty hearing?: Yes Does the patient have difficulty seeing, even when wearing glasses/contacts?: No Does the patient have difficulty concentrating, remembering, or making decisions?: No Patient able to express need for assistance with ADLs?: Yes Does the patient have difficulty dressing or bathing?: Yes Independently performs ADLs?: No Does the patient have difficulty walking or climbing stairs?: Yes Weakness of Legs: Right Weakness of Arms/Hands: None  Permission Sought/Granted Permission sought to share information with : Family Supports, Case Manager Permission granted to share information with : Yes, Verbal Permission Granted              Emotional Assessment Appearance:: Appears stated age Attitude/Demeanor/Rapport: Engaged Affect (typically observed): Accepting, Appropriate, Calm, Hopeful Orientation: : Oriented to Self, Oriented to Place, Oriented to Situation Alcohol / Substance Use: Not Applicable Psych Involvement: No (comment)  Admission diagnosis:  Shortness of breath [R06.02] CHF (congestive heart failure) (HCC) [I50.9] ESRD on dialysis ([place]) [N18.6, Z99.2] Volume overload [E87.70] Acute CHF (congestive heart failure) (HCC) [I50.9] Patient Active Problem List   Diagnosis Date Noted   Acute CHF (congestive heart failure) ([place]) 12/26/2021   Volume overload 12/25/2021   CHF (congestive heart failure) ([place]) 12/25/2021   Acute on chronic anemia    ESRD on dialysis ([place])  Hypertension    Dyslipidemia    Hyponatremia 12/09/2021   Mixed hyperlipidemia 11/13/2021   Musculoskeletal neck pain 11/13/2021   Acute blood loss anemia 11/12/2021   Iron deficiency anemia due to chronic blood loss 10/17/2021   Euthyroid sick syndrome 10/10/2021   Elevated troponin  10/10/2021   Recurrent pneumonia 10/08/2021   LLQ abdominal pain 10/08/2021   Physical deconditioning 10/08/2021   Cognitive decline 10/08/2021   Hyperkalemia 10/07/2021   Left flank pain 10/07/2021   Intestinal polyps 10/06/2021   Portal hypertensive gastropathy ([place]) 10/06/2021   Rotator cuff syndrome 10/05/2021   Epistaxis 10/05/2021   Hx of BKA, left ([place]) 10/05/2021   Recurrent gastrointestinal hemorrhage 09/28/2021   Edema of amputation stump of left lower extremity ([place]) 09/28/2021   Chronic pain disorder 09/28/2021   GI bleed 08/23/2021   Amputation stump infection ([place])    Sepsis ([place]) 08/16/2021   Right foot pain 08/07/2021   Right lower lobe lung mass 08/06/2021   Hepatocellular carcinoma ([place]) 08/06/2021   Melena    Pressure injury of skin 07/05/2021   Right leg pain 07/04/2021   GI bleeding 07/04/2021   Acute on chronic diastolic CHF (congestive heart failure) ([place]) 06/14/2021   GERD without esophagitis 06/14/2021   Chest pain 06/13/2021   Diabetic foot infection ([place]) 04/14/2021   PAD (peripheral artery disease) ([place]) 04/08/2021   Gastritis and gastroduodenitis    ESRD (end stage renal disease) on dialysis ([place]) 03/25/2021   Diabetes mellitus type 2, controlled, with complications ([place]) 24/40/1027   QT prolongation 03/25/2021   Alcohol abuse with intoxication ([place]) 03/25/2021   Cocaine abuse ([place]) 03/25/2021   Tobacco use disorder 03/25/2021   Class 2 obesity due to excess calories with body mass index (BMI) of 39.0 to 39.9 in adult 03/25/2021   AVM (arteriovenous malformation) of duodenum, acquired    Peptic ulcer disease    Upper GI bleed 03/24/2021   Chalazion of right upper eyelid 03/24/2021   Thrombocytopenia ([place]) 03/24/2021   Visual hallucination 03/24/2021   Bilateral leg edema 03/24/2021   Benign neoplasm of duodenum, jejunum, and ileum    Anemia due to chronic kidney disease 02/23/2021   Prolonged QT interval 02/23/2021   Rotator cuff tear  arthropathy of right shoulder 01/23/2021   Alcohol dependence ([place]) 01/13/2021   Gastric ulcer with hemorrhage 01/13/2021   Generalized weakness    Transaminitis 01/10/2021   History of alcohol abuse 10/27/2020   History of cocaine abuse ([place]) 10/27/2020   Nicotine dependence, cigarettes, uncomplicated 25/36/6440   Acute GI bleeding 08/10/2020   Duodenal ulcer with hemorrhage    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 [address] Phone: [phone] Fax: [phone]     Social Determinants of Health (SDOH) Interventions    Readmission Risk Interventions    10/10/2021   10:49 AM 02/27/2021   12:51 PM 07/07/2019  4:03 PM  Readmission Risk Prevention Plan  Transportation Screening Complete Complete Complete  PCP or Specialist Appt within 3-5 Days   Complete  HRI or Home Care Consult   Complete  Social Work Consult for [place] Planning/Counseling   Complete  Palliative Care Screening   Not Applicable  Medication Review Press photographer) Complete Complete Complete  PCP or Specialist appointment within 3-5 days of discharge Complete Complete   HRI or Home Care Consult Complete Complete   SW Recovery Care/Counseling Consult Complete Complete   Palliative Care Screening Complete Not [place] Patient Refused  Not Applicable

## 2021-12-26 NOTE — Telephone Encounter (Signed)
Daniel [name] with [employer] is calling back in to request to have [name] to fax over the patient complete record for DOS 8/22. She said that the only page received was the page that changes were made on. Please fax records to : [phone] she is assisting pt with getting a wheelchair.

## 2021-12-26 NOTE — Telephone Encounter (Signed)
Please arrange hospital follow up in 1-2 weeks to go over bronchoscopy results with an APP.

## 2021-12-26 NOTE — Anesthesia Preprocedure Evaluation (Addendum)
Anesthesia Evaluation  Patient identified by MRN, date of birth, ID band Patient awake    Reviewed: Allergy & Precautions, NPO status , Patient's Chart, lab work & pertinent test results  History of Anesthesia Complications Negative for: history of anesthetic complications  Airway Mallampati: II  TM Distance: >3 FB Neck ROM: Full    Dental  (+) Poor Dentition, Missing, Dental Advisory Given, Chipped   Pulmonary Current Smoker and Patient abstained from smoking.,  RLL mass   breath sounds clear to auscultation       Cardiovascular hypertension, Pt. on medications pulmonary hypertension+ Peripheral Vascular Disease   Rhythm:Regular Rate:Normal  05/2021 ECHO: EF 55-60%. The LV has normal function, no regional wall motion abnormalities, LV was  mildly dilated, mild LVH, normal RVF, no significant valvular abnormalities   Neuro/Psych negative neurological ROS     GI/Hepatic PUD, GERD  Controlled,(+) Cirrhosis     substance abuse  alcohol use and cocaine use, Hepatitis -, CHepatocellular ca AVM of duodenum   Endo/Other  diabetes (glu 87)BMI 31  Renal/GU Dialysis and ESRFRenal disease (MWF)     Musculoskeletal   Abdominal (+) + obese,   Peds  Hematology  (+) Blood dyscrasia (Hb 8.7, plt 115k), anemia ,   Anesthesia Other Findings   Reproductive/Obstetrics                            Anesthesia Physical Anesthesia Plan  ASA: 4  Anesthesia Plan: General   Post-op Pain Management: Minimal or no pain anticipated   Induction: Intravenous  PONV Risk Score and Plan: 1 and Ondansetron and Dexamethasone  Airway Management Planned: Oral ETT  Additional Equipment: None  Intra-op Plan:   Post-operative Plan: Extubation in OR  Informed Consent: I have reviewed the patients History and Physical, chart, labs and discussed the procedure including the risks, benefits and alternatives for the  proposed anesthesia with the patient or authorized representative who has indicated his/her understanding and acceptance.     Dental advisory given  Plan Discussed with: CRNA and Surgeon  Anesthesia Plan Comments:        Anesthesia Quick Evaluation

## 2021-12-26 NOTE — Transfer of Care (Signed)
Immediate Anesthesia Transfer of Care Note  Patient: Leslye Peer Mckinnon  Procedure(s) Performed: VIDEO BRONCHOSCOPY WITH FLUORO BRONCHIAL BIOPSIES BRONCHIAL WASHINGS  Patient Location: Endoscopy Unit  Anesthesia Type:General  Level of Consciousness: awake and alert   Airway & Oxygen Therapy: Patient Spontanous Breathing and Patient connected to face mask oxygen  Post-op Assessment: Report given to RN and Post -op Vital signs reviewed and stable  Post vital signs: Reviewed and stable  Last Vitals:  Vitals Value Taken Time  BP 139/68 12/26/21 1600  Temp 36.5 C 12/26/21 1559  Pulse 79 12/26/21 1602  Resp 22 12/26/21 1602  SpO2 99 % 12/26/21 1602  Vitals shown include unvalidated device data.  Last Pain:  Vitals:   12/26/21 1559  TempSrc: Temporal  PainSc: 0-No pain      Patients Stated Pain Goal: 0 (11/27/21 3612)  Complications: No notable events documented.

## 2021-12-27 DIAGNOSIS — E8770 Fluid overload, unspecified: Secondary | ICD-10-CM | POA: Diagnosis not present

## 2021-12-27 DIAGNOSIS — I5031 Acute diastolic (congestive) heart failure: Secondary | ICD-10-CM | POA: Diagnosis not present

## 2021-12-27 DIAGNOSIS — J189 Pneumonia, unspecified organism: Secondary | ICD-10-CM | POA: Diagnosis not present

## 2021-12-27 LAB — RENAL FUNCTION PANEL
Albumin: 2.2 g/dL — ABNORMAL LOW (ref 3.5–5.0)
Anion gap: 11 (ref 5–15)
BUN: 20 mg/dL (ref 8–23)
CO2: 25 mmol/L (ref 22–32)
Calcium: 8.4 mg/dL — ABNORMAL LOW (ref 8.9–10.3)
Chloride: 92 mmol/L — ABNORMAL LOW (ref 98–111)
Creatinine, Ser: 5.26 mg/dL — ABNORMAL HIGH (ref 0.61–1.24)
GFR, Estimated: 12 mL/min — ABNORMAL LOW (ref >60)
Glucose, Bld: 148 mg/dL — ABNORMAL HIGH (ref 70–99)
Phosphorus: 2.7 mg/dL (ref 2.5–4.6)
Potassium: 3.7 mmol/L (ref 3.5–5.1)
Sodium: 128 mmol/L — ABNORMAL LOW (ref 135–145)

## 2021-12-27 LAB — CBC
HCT: 23.8 % — ABNORMAL LOW (ref 39.0–52.0)
Hemoglobin: 8.1 g/dL — ABNORMAL LOW (ref 13.0–17.0)
MCH: 33.2 pg (ref 26.0–34.0)
MCHC: 34 g/dL (ref 30.0–36.0)
MCV: 97.5 fL (ref 80.0–100.0)
Platelets: 108 10*3/uL — ABNORMAL LOW (ref 150–400)
RBC: 2.44 MIL/uL — ABNORMAL LOW (ref 4.22–5.81)
RDW: 18.1 % — ABNORMAL HIGH (ref 11.5–15.5)
WBC: 2.4 10*3/uL — ABNORMAL LOW (ref 4.0–10.5)
nRBC: 0 % (ref 0.0–0.2)

## 2021-12-27 MED ORDER — DIPHENHYDRAMINE HCL 25 MG PO CAPS
25.0000 mg | ORAL_CAPSULE | Freq: Once | ORAL | Status: AC
  Administered 2021-12-27: 25 mg via ORAL
  Filled 2021-12-27: qty 1

## 2021-12-27 MED ORDER — DIPHENHYDRAMINE HCL 25 MG PO CAPS
25.0000 mg | ORAL_CAPSULE | Freq: Four times a day (QID) | ORAL | Status: DC | PRN
Start: 2021-12-27 — End: 2021-12-27

## 2021-12-27 NOTE — Telephone Encounter (Signed)
Complete office note has been faxed.

## 2021-12-27 NOTE — Progress Notes (Signed)
Patient came back from Dialysis, took heart monitor off saying he was going home. MD paged, stated he didn't tell patient he was going home. Patient said he was leaving and signed AMA form. IV removed    Daniel [name], Tivis Ringer, RN

## 2021-12-27 NOTE — Progress Notes (Signed)
[facility] Progress Note   Subjective: On HD C/O itching. Attempting 5 liters today. Denies SOB.   Objective Vitals:   12/27/21 0709 12/27/21 0730 12/27/21 0800 12/27/21 0830  BP: (!) 161/87 (!) 159/92 (!) 144/120 (!) 156/71  Pulse: 90 89 88 84  Resp: 19 (!) '23 18 20  '$ Temp:      TempSrc:      SpO2: 99% 100% 99% 99%  Weight:      Height:       Physical Exam General: Chronically ill appearing male in NAD Heart: S1,S2 No M/R/G SR on monitor.  Lungs: Few upper airway wheezes, otherwise CTAB Anteriorly. No WOB.  Abdomen: Obses, NT Extremities: L BKA no stump edema. Trace R pedal edema Dialysis Access: L AVF cannulated    Additional Objective Labs: Basic Metabolic Panel: Recent Labs  Lab 12/25/21 0700 12/26/21 0457 12/27/21 0726  NA 126* 133* 128*  K 3.4* 3.7 3.7  CL 87* 95* 92*  CO2 '24 28 25  '$ GLUCOSE 85 87 148*  BUN '16 8 20  '$ CREATININE 6.21* 3.67* 5.26*  CALCIUM 8.6* 8.2* 8.4*  PHOS 3.8  --  2.7   Liver Function Tests: Recent Labs  Lab 12/25/21 0046 12/25/21 0700 12/27/21 0726  AST 49*  --   --   ALT 14  --   --   ALKPHOS 104  --   --   BILITOT 1.4*  --   --   PROT 8.0  --   --   ALBUMIN 2.3* 2.3* 2.2*   No results for input(s): "LIPASE", "AMYLASE" in the last 168 hours. CBC: Recent Labs  Lab 12/25/21 0046 12/25/21 0700 12/27/21 0727  WBC 2.7* 2.8* 2.4*  NEUTROABS 1.7 1.5*  --   HGB 8.7* 8.7* 8.1*  HCT 25.2* 25.6* 23.8*  MCV 94.0 96.6 97.5  PLT 115* 115* 108*   Blood Culture    Component Value Date/Time   SDES BLOOD BLOOD RIGHT HAND 08/16/2021 2049   SPECREQUEST  08/16/2021 2049    BOTTLES DRAWN AEROBIC AND ANAEROBIC Blood Culture adequate volume   CULT  08/16/2021 2049    NO GROWTH 5 DAYS Performed at [facility] [address] [place], [place] [address]    REPTSTATUS 08/21/2021 FINAL 08/16/2021 2049    Cardiac Enzymes: No results for input(s): "CKTOTAL", "CKMB", "CKMBINDEX", "TROPONINI" in the last 168  hours. CBG: No results for input(s): "GLUCAP" in the last 168 hours. Iron Studies: No results for input(s): "IRON", "TIBC", "TRANSFERRIN", "FERRITIN" in the last 72 hours. '@lablastinr3'$ @ Studies/Results: DG CHEST PORT 1 VIEW  Result Date: 12/26/2021 CLINICAL DATA:  Provided history: Left upper lobe transbronchial biopsy. EXAM: PORTABLE CHEST 1 VIEW COMPARISON:  Prior chest radiographs 12/26/2021 and earlier chest CT 12/25/2021. FINDINGS: Cardiomegaly. Aortic atherosclerosis. Multifocal bilateral airspace disease, not significantly changed from the chest radiograph performed earlier today. Small right pleural effusion. No evidence of pneumothorax. Degenerative changes of the spine. A vascular stent projects in the region of the left axilla. IMPRESSION: 1. No evidence of pneumothorax status post reported left upper lobe transbronchial biopsy. 2. Cardiomegaly. 3. Multifocal bilateral airspace disease, not significantly changed from the chest radiograph performed earlier today. 4. Persistent small right pleural effusion. 5. Aortic Atherosclerosis (ICD10-I70.0). Electronically Signed   By: Kellie Simmering D.O.   On: 12/26/2021 17:05   DG C-ARM BRONCHOSCOPY  Result Date: 12/26/2021 C-ARM BRONCHOSCOPY: Fluoroscopy was utilized by the requesting physician.  No radiographic interpretation.   DG CHEST PORT 1 VIEW  Result Date: 12/26/2021 CLINICAL DATA:  Encounter for CHF EXAM: PORTABLE CHEST 1 VIEW COMPARISON:  Portable exam 0516 hours compared to 12/25/2021 Correlation: CT chest 12/25/2021 FINDINGS: Enlargement of cardiac silhouette with pulmonary vascular congestion. Atherosclerotic calcification aorta. Enlargement of RIGHT hilum, question adenopathy, hila poorly assessed on prior CT due to lack of IV contrast. Mid lung infiltrates bilaterally favoring multifocal pneumonia. Small RIGHT pleural effusion and mild RIGHT basilar atelectasis. No acute osseous findings or pneumothorax. Vascular stent LEFT axilla.  IMPRESSION: BILATERAL pulmonary infiltrates favoring multifocal pneumonia. Small RIGHT pleural effusion and basilar atelectasis. Enlarged RIGHT pulmonary hilum question adenopathy versus perihilar infiltrate. Aortic Atherosclerosis (ICD10-I70.0). Electronically Signed   By: Lavonia Dana M.D.   On: 12/26/2021 07:57   Medications:  sodium chloride Stopped (12/26/21 1601)   cefTRIAXone (ROCEPHIN)  IV 1 g (12/26/21 1017)   promethazine (PHENERGAN) injection (IM or IVPB)      carvedilol  3.125 mg Oral BID WC   Chlorhexidine Gluconate Cloth  6 each Topical Q0600   Chlorhexidine Gluconate Cloth  6 each Topical Q0600   doxycycline  100 mg Oral Q12H   guaiFENesin  1,200 mg Oral BID   heparin  5,000 Units Subcutaneous Q8H   sodium chloride flush  3 mL Intravenous Q12H     Dialysis Orders: Center: [facility]  on MWF . [facility] 17H, 4 hours, BFR 450, DFR 500, EDW 101kg, 2K 2Ca AVF 15g  No heparin Sodium thiosulfate 25g IV q HD   Assessment/Plan:  Shortness of breath: Chest CT suggestive of multifocal pneumonia however based on clinical exam, suspect volume overload as well. Symptoms did improve with HD. Noted pulmonology is planning a bronchoscopy due to high risk of bronchogenic carcinoma  ESRD:  HD on MWF schedule. Next HD 12/30/2021. K+ 3.7 on 4.0 K bath. No heparin.   Hypertension: BP elevated, likely worsened by volume overload. Max UF with HD again tomorrow.   Anemia: Hb 8.1. Hx recurrent GI bleeds but no known bleeding at present. Does not get ESA due to hepatocellular carcinoma. Transfuse as needed.   Metabolic bone disease: Calcium and phos controlled. 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 Keep Ca+ low.   Nutrition:  Albumin low, also w/history of liver disease. Recommend protein supplements Hypokalemia: Mild, will use 4K bath with HD Hepatocellular carcinoma: Needs oncology follow up  Jimmye Norman. Jaxn Chiquito NP-C 12/27/2021, 8:49 AM  [employer] [phone]

## 2021-12-27 NOTE — Progress Notes (Signed)
Contacted [facility] to advise clinic that pt left AMA after HD today. Clinic advised pt should resume care on Monday.   Melven Sartorius Renal Navigator [phone]

## 2021-12-28 ENCOUNTER — Telehealth: Payer: Self-pay | Admitting: Nurse Practitioner

## 2021-12-28 LAB — ACID FAST SMEAR (AFB, MYCOBACTERIA): Acid Fast Smear: NEGATIVE

## 2021-12-28 NOTE — Telephone Encounter (Cosign Needed)
Transition of care contact from inpatient facility  Date of Discharge: 12/27/2021 Date of Contact: 12/28/2021 Method of contact: Phone  Attempted to contact patient to discuss transition of care from inpatient admission. Patient did not answer the phone. Voice mail is full, unable to leave message.

## 2021-12-29 ENCOUNTER — Encounter (HOSPITAL_COMMUNITY): Payer: Self-pay | Admitting: Pulmonary Disease

## 2021-12-29 LAB — CULTURE, BAL-QUANTITATIVE W GRAM STAIN
Culture: NO GROWTH
Gram Stain: NONE SEEN

## 2021-12-29 LAB — CULTURE, RESPIRATORY W GRAM STAIN
Culture: NO GROWTH
Gram Stain: NONE SEEN

## 2021-12-29 NOTE — Discharge Summary (Signed)
Physician Discharge Summary   Patient: Daniel [name] MRN: [id number] DOB: April 07, 1961  Admit date:     12/25/2021  Discharge date: 12/27/2021  Discharge Physician: Cordelia Poche, MD   PCP: [name], NP   Recommendations at discharge:  Patient [place] Patient has pathology from BAL/biopsy that will need follow-up  Discharge Diagnoses: Principal Problem:   Volume overload Active Problems:   Recurrent pneumonia   CHF (congestive heart failure) (HCC)   Acute CHF (congestive heart failure) ([place])  Resolved Problems:   * No resolved hospital problems. *  Hospital Course: Daniel [name] is a 61 y.o. male with a history of hepatocellular cancer, iron deficiency anemia, GAVE syndrome, ESRD on HD, SAH, PVD, left BKA. Patient presented with secondary to shortness of breath and found to have a presentation consistent with acute heart failure and possible multifocal pneumonia. Hemodialysis initiated for fluid management with improvement of symptoms. Patient also started on empiric Ceftriaxone and doxycycline for treatment of multifocal pneumonia. Pulmonology was consulted and performed bronchoscopy on 8/31; BAL and biopsy performed. Prior to completion of therapy, patient decided to leave [facility].  Assessment and Plan: Acute on chronic HFpEF Patient with significant dyspnea on admission. Initially treated with IV Lasix but symptoms ultimately improved after receiving hemodialysis. -Continue fluid management per nephrology via hemodialysis   Dyspnea Significant dyspnea and patient placed on up to 4 L/min. No documented hypoxia. Symptoms improved today since receiving hemodialysis. -Wean to room air   Bilateral lung infiltrates Multifocal pneumonia Concerning for infection vs antiinflammatory vs malignant etiology. Pulmonology consulted on admission and have recommended a bronchoscopy. Patient started empirically on Ceftriaxone and doxycycline  for treatment for community acquired pneumonia.   Moderate/severe pulmonary hypertension Noted. Likely contributing to above.   Liver cirrhosis Noted. Currently on lasix and fluid is primarily managed by hemodialysis.   Primary hypertension Patient is on Coreg and amlodipine (listed as not taking) as an outpatient.   Hepatocellular cancer Known history. Patient is established with [name], medical oncology.   ESRD on HD Nephrology consulted.   Chronic hyponatremia Management via HD   Tobacco use Cessation discussed   Pancytopenia Chronic since recently. Thrombocytopenia likely related to liver disease and anemia likely related to kidney disease.  Consultants: Pulmonology Procedures performed: Bronchoscopy  Disposition:  LEFT AGAINST MEDICAL ADVICE Diet recommendation: Low sodium  DISCHARGE MEDICATION: Allergies as of 12/27/2021       Reactions   Seroquel [quetiapine] Other (See Comments)   Tardive kinesia Dystonia    Dilaudid [hydromorphone Hcl] Itching, Other (See Comments)   Pt reports itchiness after IM injection         Medication List     ASK your doctor about these medications    amLODipine 5 MG tablet Commonly known as: NORVASC Take 1 tablet (5 mg total) by mouth daily.   atorvastatin 40 MG tablet Commonly known as: LIPITOR Take 1 tablet (40 mg total) by mouth daily.   folic acid 1 MG tablet Commonly known as: FOLVITE Take 1 tablet (1 mg total) by mouth daily.   gabapentin 300 MG capsule Commonly known as: NEURONTIN Take 1 capsule (300 mg total) by mouth at bedtime.   multivitamin with minerals Tabs tablet Take 1 tablet by mouth daily.   pantoprazole 40 MG tablet Commonly known as: PROTONIX Take 1 tablet (40 mg total) by mouth 2 (two) times daily before a meal.   thiamine 100 MG tablet Commonly known as: VITAMIN B1 Take 1  tablet (100 mg total) by mouth daily.   traZODone 50 MG tablet Commonly known as: DESYREL Take 1 tablet (50 mg  total) by mouth at bedtime as needed for sleep.        Discharge Exam: BP (!) 158/98 (BP Location: Right Arm)   Pulse 93   Temp 97.8 F (36.6 C)   Resp 18   Ht _0  (1.803 m)   Wt 97.6 kg   SpO2 96%   BMI 30.01 kg/m   LEFT AGAINST MEDICAL ADVICE  Condition at discharge: improving  The results of significant diagnostics from this hospitalization (including imaging, microbiology, ancillary and laboratory) are listed below for reference.   Imaging Studies: DG CHEST PORT 1 VIEW  Result Date: 12/26/2021 CLINICAL DATA:  Provided history: Left upper lobe transbronchial biopsy. EXAM: PORTABLE CHEST 1 VIEW COMPARISON:  Prior chest radiographs 12/26/2021 and earlier chest CT 12/25/2021. FINDINGS: Cardiomegaly. Aortic atherosclerosis. Multifocal bilateral airspace disease, not significantly changed from the chest radiograph performed earlier today. Small right pleural effusion. No evidence of pneumothorax. Degenerative changes of the spine. A vascular stent projects in the region of the left axilla. IMPRESSION: 1. No evidence of pneumothorax status post reported left upper lobe transbronchial biopsy. 2. Cardiomegaly. 3. Multifocal bilateral airspace disease, not significantly changed from the chest radiograph performed earlier today. 4. Persistent small right pleural effusion. 5. Aortic Atherosclerosis (ICD10-I70.0). Electronically Signed   By: Kellie Simmering D.O.   On: 12/26/2021 17:05   DG C-ARM BRONCHOSCOPY  Result Date: 12/26/2021 C-ARM BRONCHOSCOPY: Fluoroscopy was utilized by the requesting physician.  No radiographic interpretation.   DG CHEST PORT 1 VIEW  Result Date: 12/26/2021 CLINICAL DATA:  Encounter for CHF EXAM: PORTABLE CHEST 1 VIEW COMPARISON:  Portable exam 0516 hours compared to 12/25/2021 Correlation: CT chest 12/25/2021 FINDINGS: Enlargement of cardiac silhouette with pulmonary vascular congestion. Atherosclerotic calcification aorta. Enlargement of RIGHT hilum, question  adenopathy, hila poorly assessed on prior CT due to lack of IV contrast. Mid lung infiltrates bilaterally favoring multifocal pneumonia. Small RIGHT pleural effusion and mild RIGHT basilar atelectasis. No acute osseous findings or pneumothorax. Vascular stent LEFT axilla. IMPRESSION: BILATERAL pulmonary infiltrates favoring multifocal pneumonia. Small RIGHT pleural effusion and basilar atelectasis. Enlarged RIGHT pulmonary hilum question adenopathy versus perihilar infiltrate. Aortic Atherosclerosis (ICD10-I70.0). Electronically Signed   By: Lavonia Dana M.D.   On: 12/26/2021 07:57   CT CHEST WO CONTRAST  Result Date: 12/25/2021 CLINICAL DATA:  Evaluate pneumonia. History of multifocal hepatocellular carcinoma. EXAM: CT CHEST WITHOUT CONTRAST TECHNIQUE: Multidetector CT imaging of the chest was performed following the standard protocol without IV contrast. RADIATION DOSE REDUCTION: This exam was performed according to the departmental dose-optimization program which includes automated exposure control, adjustment of the mA and/or kV according to patient size and/or use of iterative reconstruction technique. COMPARISON:  CT 10/09/2021 FINDINGS: Cardiovascular: Mild cardiac enlargement. Aortic atherosclerosis. Coronary artery calcifications. No pericardial effusion. Mediastinum/Nodes: Normal appearance of the thyroid gland. The trachea appears patent and is midline. Normal appearance of the esophagus. No enlarged axillary lymph nodes. Left lower paratracheal lymph node measures 1.5 cm in short axis. Previously 1 cm. Hilar lymph nodes are suboptimally evaluated due to lack of IV contrast. Lungs/Pleura: Small to moderate volume right pleural effusion is increased from the previous exam. There is a small left pleural effusion which is also increased in volume from previous exam. Multifocal bilateral airspace disease is identified involving the posterior left upper lobe, lingula, left lower lobe, or right lower lobe  and central  right middle lobe. Mild peribronchovascular airspace and ground-glass densities noted in the right upper lobe. The no pneumothorax identified. No suspicious pulmonary nodule or mass identified. Upper Abdomen: The liver is cirrhotic. There is perihepatic ascites noted. Known, treated central liver lesion is incompletely characterized on today's study without IV contrast. This measures approximately 4.9 x 4.1 cm, image 125/3. Musculoskeletal: No chest wall mass or suspicious bone lesions identified. IMPRESSION: 1. Multifocal bilateral airspace disease is identified compatible with multifocal pneumonia. 2. Bilateral pleural effusions are increased in volume from previous exam. 3. Cirrhosis with stigmata of portal venous hypertension including ascites. 4. Known central liver lesion is incompletely characterized on today's study without IV contrast. 5. Coronary artery calcifications. 6. Aortic Atherosclerosis (ICD10-I70.0). Electronically Signed   By: Kerby Moors M.D.   On: 12/25/2021 08:59   DG Chest 2 View  Result Date: 12/25/2021 CLINICAL DATA:  Chest pain, shortness of breath, EXAM: CHEST - 2 VIEW COMPARISON:  Chest radiograph dated 12/20/2021. CT chest dated 10/09/2021. FINDINGS: Multifocal patchy opacities in the lingula and bilateral lower lobes. Notably, while progressive, this was present on prior CT chest dated 10/09/2021. 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 Small right pleural effusion. Mild cardiomegaly.  Thoracic aortic atherosclerosis. IMPRESSION: Multifocal patchy opacities in the lingula and bilateral lower lobes, possibly reflecting multifocal pneumonia, but raising concern for multifocal semi invasive adenocarcinoma given persistence. Pulmonology consultation is suggested. Small right pleural effusion. Electronically Signed   By: Julian Hy M.D.   On:  12/25/2021 01:24   DG Chest Port 1 View  Result Date: 12/20/2021 CLINICAL DATA:  61 year old male with history of shortness of breath. EXAM: PORTABLE CHEST 1 VIEW COMPARISON:  Chest x-ray 12/10/2021. FINDINGS: Patchy multifocal interstitial and airspace disease is noted throughout the mid to lower lungs bilaterally, most severe in the perihilar aspects of both lungs, slightly worsened compared to the prior study. Small bilateral pleural effusions (right greater than left). No pneumothorax. No definite pulmonary edema. Heart size is normal. Upper mediastinal contours are within normal limits. Atherosclerotic calcifications are noted in the thoracic aorta. IMPRESSION: 1. The appearance of the chest is concerning for multilobar bilateral pneumonia, slightly worsened compared to the prior study. 2. Small bilateral pleural effusions (right greater than left). 3. Aortic atherosclerosis. Electronically Signed   By: Vinnie Langton M.D.   On: 12/20/2021 06:16   DG CHEST PORT 1 VIEW  Result Date: 12/10/2021 CLINICAL DATA:  dialysis patient with shortness of breath. EXAM: PORTABLE CHEST 1 VIEW COMPARISON:  12/09/2021 FINDINGS: Stable cardiomediastinal contours. Aortic atherosclerotic calcifications. Unchanged appearance of hazy opacity within the left midlung and bilateral lung bases. No atelectasis. No definite pleural effusion identified. IMPRESSION: No change in left mid lung and bilateral lung base hazy opacities which may reflect areas of asymmetric edema versus infection. Cardiac enlargement. Aortic Atherosclerosis (ICD10-I70.0). Electronically Signed   By: Kerby Moors M.D.   On: 12/10/2021 10:59   DG Chest Portable 1 View  Result Date: 12/09/2021 CLINICAL DATA:  Atraumatic penile bleeding. EXAM: PORTABLE CHEST 1 VIEW COMPARISON:  November 27, 2021 FINDINGS: Stable, mild to moderate severity enlargement of the cardiac silhouette is seen. There is moderate severity calcification of the aortic arch. Mild,  hazy atelectasis and/or infiltrate is seen within the mid left lung and bilateral lung bases. A mild amount of fluid is again noted within the horizontal fissure. No pneumothorax is identified. A radiopaque vascular stent is seen overlying  the left shoulder. The visualized skeletal structures are unremarkable. IMPRESSION: 1. Stable cardiomegaly with mild mid left lung and mild bibasilar atelectasis and/or infiltrate. Electronically Signed   By: Virgina Norfolk M.D.   On: 12/09/2021 04:37    Microbiology: Results for orders placed or performed during the hospital encounter of 12/25/21  Culture, BAL-quantitative w Gram Stain     Status: None   Collection Time: 12/26/21  3:46 PM   Specimen: Bronchial Alveolar Lavage; Respiratory  Result Value Ref Range Status   Specimen Description BRONCHIAL ALVEOLAR LAVAGE  Final   Special Requests NONE  Final   Gram Stain NO WBC SEEN NO ORGANISMS SEEN   Final   Culture   Final    NO GROWTH 2 DAYS Performed at [facility], [address] [place], [place] [address]    Report Status 12/29/2021 FINAL  Final  Culture, Respiratory w Gram Stain     Status: None   Collection Time: 12/26/21  3:46 PM   Specimen: Bronchial Alveolar Lavage; Respiratory  Result Value Ref Range Status   Specimen Description BRONCHIAL ALVEOLAR LAVAGE  Final   Special Requests NONE  Final   Gram Stain NO WBC SEEN NO ORGANISMS SEEN   Final   Culture   Final    NO GROWTH 2 DAYS Performed at [facility], [address], [place], [place] [address]    Report Status 12/29/2021 FINAL  Final  Aerobic/Anaerobic Culture w Gram Stain (surgical/deep wound)     Status: None (Preliminary result)   Collection Time: 12/26/21  3:46 PM   Specimen: Bronchial Alveolar Lavage; Respiratory  Result Value Ref Range Status   Specimen Description BRONCHIAL ALVEOLAR LAVAGE  Final   Special Requests NONE  Final   Gram Stain NO WBC SEEN NO ORGANISMS SEEN   Final   Culture   Final    NO  GROWTH 2 DAYS NO ANAEROBES ISOLATED; CULTURE IN PROGRESS FOR 5 DAYS Performed at [facility], [address], [place], [place] [address]    Report Status PENDING  Incomplete  Acid Fast Smear (AFB)     Status: None   Collection Time: 12/26/21  3:46 PM   Specimen: Bronchial Alveolar Lavage; Respiratory  Result Value Ref Range Status   AFB Specimen Processing Concentration  Final   Acid Fast Smear Negative  Final    Comment: (NOTE) Performed At: [facility] [place], [place] [id number] Rush Farmer MD RF:[id number]    Source (AFB) BRONCHIAL ALVEOLAR LAVAGE  Final    Comment: Performed at [facility], [place] [address] [place], [place] [address]    Labs: CBC: Recent Labs  Lab 12/25/21 0046 12/25/21 0700 12/27/21 0727  WBC 2.7* 2.8* 2.4*  NEUTROABS 1.7 1.5*  --   HGB 8.7* 8.7* 8.1*  HCT 25.2* 25.6* 23.8*  MCV 94.0 96.6 97.5  PLT 115* 115* 830*   Basic Metabolic Panel: Recent Labs  Lab 12/25/21 0046 12/25/21 0700 12/26/21 0457 12/27/21 0726  NA 127* 126* 133* 128*  K 3.2* 3.4* 3.7 3.7  CL 88* 87* 95* 92*  CO2 _0 GLUCOSE 80 85 87 148*  BUN _1 CREATININE 5.96* 6.21* 3.67* 5.26*  CALCIUM 8.4* 8.6* 8.2* 8.4*  MG  --  1.7  --   --   PHOS  --  3.8  --  2.7   Liver Function Tests: Recent Labs  Lab 12/25/21 0046 12/25/21 0700  12/27/21 0726  AST 49*  --   --   ALT 14  --   --   ALKPHOS 104  --   --   BILITOT 1.4*  --   --   PROT 8.0  --   --   ALBUMIN 2.3* 2.3* 2.2*    Signed: Cordelia Poche, MD [facility] 12/29/2021

## 2021-12-30 LAB — FUNGAL STAIN REFLEX

## 2021-12-30 LAB — FUNGUS STAIN

## 2021-12-31 ENCOUNTER — Ambulatory Visit (INDEPENDENT_AMBULATORY_CARE_PROVIDER_SITE_OTHER): Payer: Medicare Other | Admitting: Primary Care

## 2021-12-31 ENCOUNTER — Ambulatory Visit: Payer: Medicare Other | Admitting: Cardiovascular Disease

## 2021-12-31 ENCOUNTER — Encounter: Payer: Self-pay | Admitting: *Deleted

## 2021-12-31 ENCOUNTER — Telehealth: Payer: Self-pay

## 2021-12-31 ENCOUNTER — Telehealth: Payer: Self-pay | Admitting: Hematology

## 2021-12-31 DIAGNOSIS — G894 Chronic pain syndrome: Secondary | ICD-10-CM | POA: Diagnosis not present

## 2021-12-31 LAB — SURGICAL PATHOLOGY

## 2021-12-31 NOTE — Telephone Encounter (Signed)
Rescheduled upcoming appointment per charge nurse. Patient is aware of changes.

## 2021-12-31 NOTE — Telephone Encounter (Signed)
Transition Care Management Unsuccessful Follow-up Telephone Call  Date of discharge and from where:  12/27/2021, [facility] - left AMA  Attempts:  1st Attempt  Reason for unsuccessful TCM follow-up call:  Voice mail full [phone]

## 2021-12-31 NOTE — Progress Notes (Signed)
[facility]  Telephone Note  I connected with Daniel [name], on 12/31/2021 at 2:54 PM  by telephone and verified that I am speaking with the correct person using two identifiers.   Consent: I discussed the limitations, risks, security and privacy concerns of performing an evaluation and management service by telephone and the availability of in person appointments. I also discussed with the patient that there may be a patient responsible charge related to this service. The patient expressed understanding and agreed to proceed.   Location of Patient: Home  Location of Provider:  Primary Care at [place]   Persons participating in Telemedicine visit: Daniel [name] [name],  NP [place] , CMA  History of Present Illness:ifican Daniel [name] is a 61 y.o. 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 Patient is in ., moderate-severe pain and states no one will provide him with pain medication. He recently had a protheses fitted which was too long and inside was hurting his knee. He went to [employer] [facility] today and prothesis was adjusted. Inform him sent him to pain management before  and to [facility] ph# [phone] address [address] [place], [phone].They will contact the patient to schedule an appointment  -pt has declined to schedule stating he is not interested.(10/24/21) Past Medical History:  Diagnosis Date   Anemia    Diabetes mellitus without complication ([place])    patient denies   Dialysis patient [facility])    End stage chronic kidney disease ([place])    Hypertension    ICH (intracerebral hemorrhage) ([place]) 05/20/2017   PAD  (peripheral artery disease) ([facility])    Shoulder pain, left 06/28/2013   Allergies  Allergen Reactions   Seroquel [Quetiapine] Other (See Comments)    Tardive kinesia Dystonia    Dilaudid [Hydromorphone Hcl] Itching and Other (See Comments)    Pt reports itchiness after IM injection     Current Outpatient Medications on File Prior to Visit  Medication Sig Dispense Refill   amLODipine (NORVASC) 5 MG tablet Take 1 tablet (5 mg total) by mouth daily. (Patient not taking: Reported on 12/25/2021) 30 tablet 2   atorvastatin (LIPITOR) 40 MG tablet Take 1 tablet (40 mg total) by mouth daily. (Patient not taking: Reported on 12/25/2021) 30 tablet 2   folic acid (FOLVITE) 1 MG tablet Take 1 tablet (1 mg total) by mouth daily. (Patient not taking: Reported on 12/24/2021)     gabapentin (NEURONTIN) 300 MG capsule Take 1 capsule (300 mg total) by mouth at bedtime. (Patient taking differently: Take 300 mg by mouth at bedtime as needed (for nerve pain).) 90 capsule 3   Multiple Vitamin (MULTIVITAMIN WITH MINERALS) TABS tablet Take 1 tablet by mouth daily. (Patient not taking: Reported on 12/24/2021)     pantoprazole (PROTONIX) 40 MG tablet Take 1 tablet (40 mg total) by mouth 2 (two) times daily before a meal. (Patient not taking: Reported on 12/25/2021) 60 tablet 2   thiamine (VITAMIN B1) 100 MG tablet Take 1 tablet (100 mg total) by mouth daily. (Patient not taking: Reported on 12/25/2021)     traZODone (DESYREL) 50 MG tablet Take 1 tablet (50 mg total) by mouth at bedtime as needed for sleep. (Patient not taking: Reported  on 12/24/2021) 90 tablet 1   [DISCONTINUED] colchicine 0.6 MG tablet Take 0.5 tablets (0.3 mg total) by mouth 2 (two) times daily. 14 tablet 0   [DISCONTINUED] furosemide (LASIX) 40 MG tablet Take 1 tablet (40 mg total) by mouth daily. 30 tablet 0   No current facility-administered medications on file prior to visit.    Observations/Objective: There were no vitals taken for this visit.    Assessment and Plan: Mitsuru was seen today for referral.  Diagnoses and all orders for this visit:  Chronic pain syndrome Patient is aware PCP does not prescribe narcotic and explained when pain clinic calls make an appt.  -     Ambulatory referral to Pain Clinic     Follow Up Instructions:    I discussed the assessment and treatment plan with the patient. The patient was provided an opportunity to ask questions and all were answered. The patient agreed with the plan and demonstrated an understanding of the instructions.   The patient was advised to call back or seek an in-person evaluation if the symptoms worsen or if the condition fails to improve as anticipated.     I provided 12 minutes total of non-face-to-face time during this encounter including median intraservice time, reviewing previous notes, investigations, ordering medications, medical decision making, coordinating care and patient verbalized understanding at the end of the visit.    This note has been created with Surveyor, quantity. Any transcriptional errors are unintentional.   [name], NP 12/31/2021, 2:54 PM

## 2021-12-31 NOTE — Telephone Encounter (Signed)
Patient scheduled 01/10/2022 at 11am with Rexene Edison, NP. Nothing further needed.

## 2022-01-01 ENCOUNTER — Telehealth: Payer: Self-pay | Admitting: Pulmonary Disease

## 2022-01-01 ENCOUNTER — Other Ambulatory Visit: Payer: Self-pay

## 2022-01-01 ENCOUNTER — Inpatient Hospital Stay (HOSPITAL_COMMUNITY)
Admission: EM | Admit: 2022-01-01 | Discharge: 2022-01-07 | DRG: 291 | Disposition: A | Discharge status: Home Health | Payer: Medicare Other | Attending: Internal Medicine | Admitting: Internal Medicine

## 2022-01-01 ENCOUNTER — Encounter (HOSPITAL_COMMUNITY): Payer: Self-pay | Admitting: Emergency Medicine

## 2022-01-01 ENCOUNTER — Emergency Department (HOSPITAL_COMMUNITY): Payer: Medicare Other

## 2022-01-01 ENCOUNTER — Telehealth: Payer: Self-pay

## 2022-01-01 ENCOUNTER — Encounter: Payer: Self-pay | Admitting: Hematology

## 2022-01-01 DIAGNOSIS — E871 Hypo-osmolality and hyponatremia: Secondary | ICD-10-CM | POA: Diagnosis not present

## 2022-01-01 DIAGNOSIS — E782 Mixed hyperlipidemia: Secondary | ICD-10-CM | POA: Diagnosis present

## 2022-01-01 DIAGNOSIS — Z9049 Acquired absence of other specified parts of digestive tract: Secondary | ICD-10-CM

## 2022-01-01 DIAGNOSIS — Z8673 Personal history of transient ischemic attack (TIA), and cerebral infarction without residual deficits: Secondary | ICD-10-CM

## 2022-01-01 DIAGNOSIS — J90 Pleural effusion, not elsewhere classified: Secondary | ICD-10-CM | POA: Diagnosis present

## 2022-01-01 DIAGNOSIS — J9601 Acute respiratory failure with hypoxia: Secondary | ICD-10-CM | POA: Diagnosis present

## 2022-01-01 DIAGNOSIS — C220 Liver cell carcinoma: Secondary | ICD-10-CM | POA: Diagnosis present

## 2022-01-01 DIAGNOSIS — R569 Unspecified convulsions: Secondary | ICD-10-CM | POA: Diagnosis not present

## 2022-01-01 DIAGNOSIS — F1010 Alcohol abuse, uncomplicated: Secondary | ICD-10-CM | POA: Diagnosis present

## 2022-01-01 DIAGNOSIS — Z885 Allergy status to narcotic agent status: Secondary | ICD-10-CM

## 2022-01-01 DIAGNOSIS — I132 Hypertensive heart and chronic kidney disease with heart failure and with stage 5 chronic kidney disease, or end stage renal disease: Principal | ICD-10-CM | POA: Diagnosis present

## 2022-01-01 DIAGNOSIS — Z888 Allergy status to other drugs, medicaments and biological substances status: Secondary | ICD-10-CM

## 2022-01-01 DIAGNOSIS — G253 Myoclonus: Secondary | ICD-10-CM | POA: Diagnosis present

## 2022-01-01 DIAGNOSIS — J189 Pneumonia, unspecified organism: Secondary | ICD-10-CM

## 2022-01-01 DIAGNOSIS — F10139 Alcohol abuse with withdrawal, unspecified: Secondary | ICD-10-CM | POA: Diagnosis present

## 2022-01-01 DIAGNOSIS — Z91158 Patient's noncompliance with renal dialysis for other reason: Secondary | ICD-10-CM

## 2022-01-01 DIAGNOSIS — B1920 Unspecified viral hepatitis C without hepatic coma: Secondary | ICD-10-CM | POA: Diagnosis present

## 2022-01-01 DIAGNOSIS — E669 Obesity, unspecified: Secondary | ICD-10-CM | POA: Diagnosis present

## 2022-01-01 DIAGNOSIS — I501 Left ventricular failure: Secondary | ICD-10-CM | POA: Diagnosis present

## 2022-01-01 DIAGNOSIS — E8770 Fluid overload, unspecified: Secondary | ICD-10-CM | POA: Diagnosis present

## 2022-01-01 DIAGNOSIS — K219 Gastro-esophageal reflux disease without esophagitis: Secondary | ICD-10-CM | POA: Diagnosis present

## 2022-01-01 DIAGNOSIS — Z79899 Other long term (current) drug therapy: Secondary | ICD-10-CM

## 2022-01-01 DIAGNOSIS — Z992 Dependence on renal dialysis: Secondary | ICD-10-CM

## 2022-01-01 DIAGNOSIS — E876 Hypokalemia: Secondary | ICD-10-CM | POA: Diagnosis present

## 2022-01-01 DIAGNOSIS — I5033 Acute on chronic diastolic (congestive) heart failure: Secondary | ICD-10-CM | POA: Diagnosis present

## 2022-01-01 DIAGNOSIS — Z91148 Patient's other noncompliance with medication regimen for other reason: Secondary | ICD-10-CM

## 2022-01-01 DIAGNOSIS — C228 Malignant neoplasm of liver, primary, unspecified as to type: Secondary | ICD-10-CM | POA: Diagnosis present

## 2022-01-01 DIAGNOSIS — I10 Essential (primary) hypertension: Secondary | ICD-10-CM | POA: Diagnosis present

## 2022-01-01 DIAGNOSIS — F17210 Nicotine dependence, cigarettes, uncomplicated: Secondary | ICD-10-CM | POA: Diagnosis present

## 2022-01-01 DIAGNOSIS — J810 Acute pulmonary edema: Secondary | ICD-10-CM

## 2022-01-01 DIAGNOSIS — G9341 Metabolic encephalopathy: Secondary | ICD-10-CM | POA: Diagnosis present

## 2022-01-01 DIAGNOSIS — K7460 Unspecified cirrhosis of liver: Secondary | ICD-10-CM | POA: Diagnosis present

## 2022-01-01 DIAGNOSIS — N186 End stage renal disease: Secondary | ICD-10-CM

## 2022-01-01 DIAGNOSIS — J9801 Acute bronchospasm: Secondary | ICD-10-CM | POA: Diagnosis present

## 2022-01-01 DIAGNOSIS — Z6830 Body mass index (BMI) 30.0-30.9, adult: Secondary | ICD-10-CM

## 2022-01-01 DIAGNOSIS — Z993 Dependence on wheelchair: Secondary | ICD-10-CM

## 2022-01-01 DIAGNOSIS — E1122 Type 2 diabetes mellitus with diabetic chronic kidney disease: Secondary | ICD-10-CM | POA: Diagnosis present

## 2022-01-01 DIAGNOSIS — Z89512 Acquired absence of left leg below knee: Secondary | ICD-10-CM

## 2022-01-01 LAB — CBC
HCT: 24.5 % — ABNORMAL LOW (ref 39.0–52.0)
Hemoglobin: 8.6 g/dL — ABNORMAL LOW (ref 13.0–17.0)
MCH: 33 pg (ref 26.0–34.0)
MCHC: 35.1 g/dL (ref 30.0–36.0)
MCV: 93.9 fL (ref 80.0–100.0)
Platelets: 96 10*3/uL — ABNORMAL LOW (ref 150–400)
RBC: 2.61 MIL/uL — ABNORMAL LOW (ref 4.22–5.81)
RDW: 18.1 % — ABNORMAL HIGH (ref 11.5–15.5)
WBC: 3.9 10*3/uL — ABNORMAL LOW (ref 4.0–10.5)
nRBC: 0 % (ref 0.0–0.2)

## 2022-01-01 LAB — I-STAT CHEM 8, ED
BUN: 40 mg/dL — ABNORMAL HIGH (ref 8–23)
Calcium, Ion: 0.99 mmol/L — ABNORMAL LOW (ref 1.15–1.40)
Chloride: 90 mmol/L — ABNORMAL LOW (ref 98–111)
Creatinine, Ser: 9.3 mg/dL — ABNORMAL HIGH (ref 0.61–1.24)
Glucose, Bld: 91 mg/dL (ref 70–99)
HCT: 27 % — ABNORMAL LOW (ref 39.0–52.0)
Hemoglobin: 9.2 g/dL — ABNORMAL LOW (ref 13.0–17.0)
Potassium: 4.6 mmol/L (ref 3.5–5.1)
Sodium: 120 mmol/L — ABNORMAL LOW (ref 135–145)
TCO2: 21 mmol/L — ABNORMAL LOW (ref 22–32)

## 2022-01-01 LAB — COMPREHENSIVE METABOLIC PANEL
ALT: 13 U/L (ref 0–44)
AST: 39 U/L (ref 15–41)
Albumin: 2.2 g/dL — ABNORMAL LOW (ref 3.5–5.0)
Alkaline Phosphatase: 100 U/L (ref 38–126)
Anion gap: 14 (ref 5–15)
BUN: 44 mg/dL — ABNORMAL HIGH (ref 8–23)
CO2: 21 mmol/L — ABNORMAL LOW (ref 22–32)
Calcium: 8.5 mg/dL — ABNORMAL LOW (ref 8.9–10.3)
Chloride: 85 mmol/L — ABNORMAL LOW (ref 98–111)
Creatinine, Ser: 8.68 mg/dL — ABNORMAL HIGH (ref 0.61–1.24)
GFR, Estimated: 6 mL/min — ABNORMAL LOW (ref >60)
Glucose, Bld: 91 mg/dL (ref 70–99)
Potassium: 4.4 mmol/L (ref 3.5–5.1)
Sodium: 120 mmol/L — ABNORMAL LOW (ref 135–145)
Total Bilirubin: 1 mg/dL (ref 0.3–1.2)
Total Protein: 7.4 g/dL (ref 6.5–8.1)

## 2022-01-01 LAB — AEROBIC/ANAEROBIC CULTURE W GRAM STAIN (SURGICAL/DEEP WOUND)
Culture: NO GROWTH
Gram Stain: NONE SEEN

## 2022-01-01 LAB — CYTOLOGY - NON PAP

## 2022-01-01 NOTE — ED Triage Notes (Addendum)
Pt reported to ED with c/o shortness of breath, stating he has not had dialysis since last Friday. Normal days of treatment are M-W-F but states "it has been a while" since he has been to clinic.

## 2022-01-01 NOTE — Telephone Encounter (Signed)
Transition Care Management Follow-up Telephone Call Date of discharge and from where: 12/27/2021, [facility] - left AMA  Patient had appointment with PCP yesterday

## 2022-01-01 NOTE — Telephone Encounter (Signed)
I called the patient to go over the results from his bronchoscopy which showed no evidence of malignancy.  I told him that he has an appointment on September 15 and he needs to keep it.  He says that he well

## 2022-01-01 NOTE — ED Provider Triage Note (Signed)
Emergency Medicine Provider Triage Evaluation Note  Daniel [name] , a 61 y.o. male  was evaluated in triage.  Pt complains of shortness of breath.  He states that he has missed dialysis Monday and Wednesday of this week.  He is on a Monday Wednesday Friday dialysis schedule.  He states he feels achy and tired.  He states that the reason he missed dialysis was because his car stopped working.  He denies any chest pain, nausea or vomiting.  Review of Systems  Positive: SOB Negative: Fever  Physical Exam  BP (!) 146/82 (BP Location: Right Arm)   Pulse 91   Temp 98.6 F (37 C) (Oral)   Resp (!) 22   SpO2 98%  Gen:   Awake, no distress   Resp:  Normal effort  MSK:   Moves extremities without difficulty  Other:  Crackles auscultated on lung exam  Medical Decision Making  Medically screening exam initiated at 7:36 PM.  Appropriate orders placed.  Stoney C Hamor was informed that the remainder of the evaluation will be completed by another provider, this initial triage assessment does not replace that evaluation, and the importance of remaining in the ED until their evaluation is complete.  Labs, CXR ekg(for intervals)   Tedd Sias, [place] 01/01/22 1936

## 2022-01-02 ENCOUNTER — Encounter (HOSPITAL_COMMUNITY): Payer: Self-pay | Admitting: Internal Medicine

## 2022-01-02 DIAGNOSIS — J9801 Acute bronchospasm: Secondary | ICD-10-CM | POA: Diagnosis present

## 2022-01-02 DIAGNOSIS — I501 Left ventricular failure: Secondary | ICD-10-CM | POA: Diagnosis present

## 2022-01-02 DIAGNOSIS — B1920 Unspecified viral hepatitis C without hepatic coma: Secondary | ICD-10-CM | POA: Diagnosis present

## 2022-01-02 DIAGNOSIS — Z993 Dependence on wheelchair: Secondary | ICD-10-CM | POA: Diagnosis not present

## 2022-01-02 DIAGNOSIS — I10 Essential (primary) hypertension: Secondary | ICD-10-CM

## 2022-01-02 DIAGNOSIS — F1010 Alcohol abuse, uncomplicated: Secondary | ICD-10-CM

## 2022-01-02 DIAGNOSIS — R569 Unspecified convulsions: Secondary | ICD-10-CM | POA: Diagnosis not present

## 2022-01-02 DIAGNOSIS — I5033 Acute on chronic diastolic (congestive) heart failure: Secondary | ICD-10-CM | POA: Diagnosis present

## 2022-01-02 DIAGNOSIS — E876 Hypokalemia: Secondary | ICD-10-CM | POA: Diagnosis present

## 2022-01-02 DIAGNOSIS — J810 Acute pulmonary edema: Secondary | ICD-10-CM

## 2022-01-02 DIAGNOSIS — E871 Hypo-osmolality and hyponatremia: Secondary | ICD-10-CM

## 2022-01-02 DIAGNOSIS — J9601 Acute respiratory failure with hypoxia: Secondary | ICD-10-CM | POA: Diagnosis present

## 2022-01-02 DIAGNOSIS — K219 Gastro-esophageal reflux disease without esophagitis: Secondary | ICD-10-CM | POA: Diagnosis present

## 2022-01-02 DIAGNOSIS — Z89512 Acquired absence of left leg below knee: Secondary | ICD-10-CM | POA: Diagnosis not present

## 2022-01-02 DIAGNOSIS — C220 Liver cell carcinoma: Secondary | ICD-10-CM

## 2022-01-02 DIAGNOSIS — G253 Myoclonus: Secondary | ICD-10-CM | POA: Diagnosis present

## 2022-01-02 DIAGNOSIS — F10139 Alcohol abuse with withdrawal, unspecified: Secondary | ICD-10-CM | POA: Diagnosis present

## 2022-01-02 DIAGNOSIS — F17210 Nicotine dependence, cigarettes, uncomplicated: Secondary | ICD-10-CM | POA: Diagnosis present

## 2022-01-02 DIAGNOSIS — E8770 Fluid overload, unspecified: Secondary | ICD-10-CM | POA: Diagnosis not present

## 2022-01-02 DIAGNOSIS — E782 Mixed hyperlipidemia: Secondary | ICD-10-CM

## 2022-01-02 DIAGNOSIS — N186 End stage renal disease: Secondary | ICD-10-CM | POA: Diagnosis present

## 2022-01-02 DIAGNOSIS — Z79899 Other long term (current) drug therapy: Secondary | ICD-10-CM | POA: Diagnosis not present

## 2022-01-02 DIAGNOSIS — E1122 Type 2 diabetes mellitus with diabetic chronic kidney disease: Secondary | ICD-10-CM | POA: Diagnosis present

## 2022-01-02 DIAGNOSIS — K7460 Unspecified cirrhosis of liver: Secondary | ICD-10-CM | POA: Diagnosis present

## 2022-01-02 DIAGNOSIS — E669 Obesity, unspecified: Secondary | ICD-10-CM | POA: Diagnosis present

## 2022-01-02 DIAGNOSIS — C228 Malignant neoplasm of liver, primary, unspecified as to type: Secondary | ICD-10-CM | POA: Diagnosis present

## 2022-01-02 DIAGNOSIS — Z992 Dependence on renal dialysis: Secondary | ICD-10-CM

## 2022-01-02 DIAGNOSIS — J90 Pleural effusion, not elsewhere classified: Secondary | ICD-10-CM | POA: Diagnosis present

## 2022-01-02 DIAGNOSIS — I132 Hypertensive heart and chronic kidney disease with heart failure and with stage 5 chronic kidney disease, or end stage renal disease: Secondary | ICD-10-CM | POA: Diagnosis present

## 2022-01-02 DIAGNOSIS — G9341 Metabolic encephalopathy: Secondary | ICD-10-CM | POA: Diagnosis present

## 2022-01-02 LAB — BLOOD GAS, VENOUS
Acid-Base Excess: 7.1 mmol/L — ABNORMAL HIGH (ref 0.0–2.0)
Bicarbonate: 31.3 mmol/L — ABNORMAL HIGH (ref 20.0–28.0)
Drawn by: 6056
O2 Saturation: 86.2 %
Patient temperature: 36.4
pCO2, Ven: 41 mmHg — ABNORMAL LOW (ref 44–60)
pH, Ven: 7.49 — ABNORMAL HIGH (ref 7.25–7.43)
pO2, Ven: 51 mmHg — ABNORMAL HIGH (ref 32–45)

## 2022-01-02 LAB — CBC WITH DIFFERENTIAL/PLATELET
Abs Immature Granulocytes: 0.01 10*3/uL (ref 0.00–0.07)
Basophils Absolute: 0 10*3/uL (ref 0.0–0.1)
Basophils Relative: 0 %
Eosinophils Absolute: 0 10*3/uL (ref 0.0–0.5)
Eosinophils Relative: 0 %
HCT: 26.2 % — ABNORMAL LOW (ref 39.0–52.0)
Hemoglobin: 9.3 g/dL — ABNORMAL LOW (ref 13.0–17.0)
Immature Granulocytes: 0 %
Lymphocytes Relative: 8 %
Lymphs Abs: 0.3 10*3/uL — ABNORMAL LOW (ref 0.7–4.0)
MCH: 33.7 pg (ref 26.0–34.0)
MCHC: 35.5 g/dL (ref 30.0–36.0)
MCV: 94.9 fL (ref 80.0–100.0)
Monocytes Absolute: 0.3 10*3/uL (ref 0.1–1.0)
Monocytes Relative: 9 %
Neutro Abs: 3 10*3/uL (ref 1.7–7.7)
Neutrophils Relative %: 83 %
Platelets: 97 10*3/uL — ABNORMAL LOW (ref 150–400)
RBC: 2.76 MIL/uL — ABNORMAL LOW (ref 4.22–5.81)
RDW: 18.1 % — ABNORMAL HIGH (ref 11.5–15.5)
WBC: 3.7 10*3/uL — ABNORMAL LOW (ref 4.0–10.5)
nRBC: 0 % (ref 0.0–0.2)

## 2022-01-02 LAB — I-STAT VENOUS BLOOD GAS, ED
Acid-base deficit: 4 mmol/L — ABNORMAL HIGH (ref 0.0–2.0)
Bicarbonate: 19.1 mmol/L — ABNORMAL LOW (ref 20.0–28.0)
Calcium, Ion: 1 mmol/L — ABNORMAL LOW (ref 1.15–1.40)
HCT: 29 % — ABNORMAL LOW (ref 39.0–52.0)
Hemoglobin: 9.9 g/dL — ABNORMAL LOW (ref 13.0–17.0)
O2 Saturation: 94 %
Potassium: 4.3 mmol/L (ref 3.5–5.1)
Sodium: 121 mmol/L — ABNORMAL LOW (ref 135–145)
TCO2: 20 mmol/L — ABNORMAL LOW (ref 22–32)
pCO2, Ven: 28.4 mmHg — ABNORMAL LOW (ref 44–60)
pH, Ven: 7.435 — ABNORMAL HIGH (ref 7.25–7.43)
pO2, Ven: 67 mmHg — ABNORMAL HIGH (ref 32–45)

## 2022-01-02 LAB — COMPREHENSIVE METABOLIC PANEL
ALT: 13 U/L (ref 0–44)
AST: 42 U/L — ABNORMAL HIGH (ref 15–41)
Albumin: 2.4 g/dL — ABNORMAL LOW (ref 3.5–5.0)
Alkaline Phosphatase: 108 U/L (ref 38–126)
Anion gap: 17 — ABNORMAL HIGH (ref 5–15)
BUN: 49 mg/dL — ABNORMAL HIGH (ref 8–23)
CO2: 19 mmol/L — ABNORMAL LOW (ref 22–32)
Calcium: 8.5 mg/dL — ABNORMAL LOW (ref 8.9–10.3)
Chloride: 83 mmol/L — ABNORMAL LOW (ref 98–111)
Creatinine, Ser: 8.91 mg/dL — ABNORMAL HIGH (ref 0.61–1.24)
GFR, Estimated: 6 mL/min — ABNORMAL LOW (ref >60)
Glucose, Bld: 137 mg/dL — ABNORMAL HIGH (ref 70–99)
Potassium: 4 mmol/L (ref 3.5–5.1)
Sodium: 119 mmol/L — CL (ref 135–145)
Total Bilirubin: 1.2 mg/dL (ref 0.3–1.2)
Total Protein: 8.2 g/dL — ABNORMAL HIGH (ref 6.5–8.1)

## 2022-01-02 LAB — TROPONIN I (HIGH SENSITIVITY): Troponin I (High Sensitivity): 15 ng/L (ref <18)

## 2022-01-02 LAB — BRAIN NATRIURETIC PEPTIDE: B Natriuretic Peptide: 1034.3 pg/mL — ABNORMAL HIGH (ref 0.0–100.0)

## 2022-01-02 MED ORDER — HEPARIN SODIUM (PORCINE) 5000 UNIT/ML IJ SOLN
5000.0000 [IU] | Freq: Three times a day (TID) | INTRAMUSCULAR | Status: DC
  Administered 2022-01-02 – 2022-01-07 (×14): 5000 [IU] via SUBCUTANEOUS
  Filled 2022-01-02 (×14): qty 1

## 2022-01-02 MED ORDER — ALBUTEROL SULFATE (2.5 MG/3ML) 0.083% IN NEBU
2.5000 mg | INHALATION_SOLUTION | RESPIRATORY_TRACT | Status: DC | PRN
Start: 2022-01-02 — End: 2022-01-04
  Administered 2022-01-03 (×2): 2.5 mg via RESPIRATORY_TRACT
  Filled 2022-01-02 (×2): qty 3

## 2022-01-02 MED ORDER — DIPHENHYDRAMINE HCL 50 MG/ML IJ SOLN
25.0000 mg | Freq: Once | INTRAMUSCULAR | Status: AC
  Administered 2022-01-02: 50 mg via INTRAVENOUS

## 2022-01-02 MED ORDER — ADULT MULTIVITAMIN W/MINERALS CH
1.0000 | ORAL_TABLET | Freq: Every day | ORAL | Status: DC
  Administered 2022-01-02 – 2022-01-07 (×4): 1 via ORAL
  Filled 2022-01-02 (×4): qty 1

## 2022-01-02 MED ORDER — CHLORHEXIDINE GLUCONATE CLOTH 2 % EX PADS: 6.0000 | MEDICATED_PAD | Freq: Every day | CUTANEOUS | Status: DC

## 2022-01-02 MED ORDER — ONDANSETRON HCL 4 MG/2ML IJ SOLN
4.0000 mg | Freq: Four times a day (QID) | INTRAMUSCULAR | Status: DC | PRN
  Administered 2022-01-04: 4 mg via INTRAVENOUS
  Filled 2022-01-02: qty 2

## 2022-01-02 MED ORDER — PANTOPRAZOLE SODIUM 40 MG PO TBEC
40.0000 mg | DELAYED_RELEASE_TABLET | Freq: Two times a day (BID) | ORAL | Status: DC
  Administered 2022-01-02 – 2022-01-07 (×10): 40 mg via ORAL
  Filled 2022-01-02 (×10): qty 1

## 2022-01-02 MED ORDER — DOXYCYCLINE HYCLATE 100 MG PO TABS
100.0000 mg | ORAL_TABLET | Freq: Once | ORAL | Status: AC
Start: 2022-01-02 — End: 2022-01-02
  Administered 2022-01-02: 100 mg via ORAL
  Filled 2022-01-02: qty 1

## 2022-01-02 MED ORDER — TRAZODONE HCL 50 MG PO TABS
50.0000 mg | ORAL_TABLET | Freq: Every evening | ORAL | Status: DC | PRN
  Administered 2022-01-02: 50 mg via ORAL
  Filled 2022-01-02: qty 1

## 2022-01-02 MED ORDER — THIAMINE MONONITRATE 100 MG PO TABS
100.0000 mg | ORAL_TABLET | Freq: Every day | ORAL | Status: DC
  Administered 2022-01-02 – 2022-01-03 (×2): 100 mg via ORAL
  Filled 2022-01-02 (×3): qty 1

## 2022-01-02 MED ORDER — NICOTINE 21 MG/24HR TD PT24
21.0000 mg | MEDICATED_PATCH | Freq: Every day | TRANSDERMAL | Status: DC
  Administered 2022-01-02 – 2022-01-07 (×6): 21 mg via TRANSDERMAL
  Filled 2022-01-02 (×6): qty 1

## 2022-01-02 MED ORDER — CHLORHEXIDINE GLUCONATE CLOTH 2 % EX PADS
6.0000 | MEDICATED_PAD | Freq: Every day | CUTANEOUS | Status: DC
  Administered 2022-01-03 – 2022-01-04 (×2): 6 via TOPICAL

## 2022-01-02 MED ORDER — ATORVASTATIN CALCIUM 40 MG PO TABS
40.0000 mg | ORAL_TABLET | Freq: Every day | ORAL | Status: DC
  Administered 2022-01-02 – 2022-01-07 (×4): 40 mg via ORAL
  Filled 2022-01-02 (×4): qty 1

## 2022-01-02 MED ORDER — HYDROMORPHONE HCL 1 MG/ML IJ SOLN
0.5000 mg | INTRAMUSCULAR | Status: DC | PRN
  Administered 2022-01-02 – 2022-01-03 (×3): 0.5 mg via INTRAVENOUS
  Filled 2022-01-02 (×3): qty 0.5

## 2022-01-02 MED ORDER — DIPHENHYDRAMINE HCL 50 MG/ML IJ SOLN
INTRAMUSCULAR | Status: AC
  Filled 2022-01-02: qty 1

## 2022-01-02 MED ORDER — ONDANSETRON HCL 4 MG PO TABS: 4.0000 mg | ORAL_TABLET | Freq: Four times a day (QID) | ORAL | Status: DC | PRN

## 2022-01-02 MED ORDER — ACETAMINOPHEN 325 MG PO TABS: 650.0000 mg | ORAL_TABLET | Freq: Four times a day (QID) | ORAL | Status: DC | PRN

## 2022-01-02 MED ORDER — SODIUM CHLORIDE 0.9 % IV SOLN
1.0000 g | Freq: Once | INTRAVENOUS | Status: AC
  Administered 2022-01-02: 1 g via INTRAVENOUS
  Filled 2022-01-02: qty 10

## 2022-01-02 MED ORDER — HYDRALAZINE HCL 20 MG/ML IJ SOLN: 10.0000 mg | Freq: Four times a day (QID) | INTRAMUSCULAR | Status: DC | PRN

## 2022-01-02 MED ORDER — LORAZEPAM 2 MG/ML IJ SOLN
1.0000 mg | INTRAMUSCULAR | Status: AC | PRN
  Administered 2022-01-04: 1 mg via INTRAVENOUS
  Filled 2022-01-02: qty 1

## 2022-01-02 MED ORDER — PREDNISONE 20 MG PO TABS
60.0000 mg | ORAL_TABLET | Freq: Once | ORAL | Status: AC
  Administered 2022-01-02: 60 mg via ORAL
  Filled 2022-01-02: qty 3

## 2022-01-02 MED ORDER — POLYETHYLENE GLYCOL 3350 17 G PO PACK: 17.0000 g | PACK | Freq: Every day | ORAL | Status: DC | PRN

## 2022-01-02 MED ORDER — GABAPENTIN 300 MG PO CAPS
300.0000 mg | ORAL_CAPSULE | Freq: Every day | ORAL | Status: DC
  Administered 2022-01-02 – 2022-01-06 (×5): 300 mg via ORAL
  Filled 2022-01-02 (×5): qty 1

## 2022-01-02 MED ORDER — ACETAMINOPHEN 650 MG RE SUPP: 650.0000 mg | Freq: Four times a day (QID) | RECTAL | Status: DC | PRN

## 2022-01-02 MED ORDER — AMLODIPINE BESYLATE 5 MG PO TABS
5.0000 mg | ORAL_TABLET | Freq: Every day | ORAL | Status: DC
  Administered 2022-01-02 – 2022-01-03 (×2): 5 mg via ORAL
  Filled 2022-01-02 (×3): qty 1

## 2022-01-02 MED ORDER — FOLIC ACID 1 MG PO TABS
1.0000 mg | ORAL_TABLET | Freq: Every day | ORAL | Status: DC
  Administered 2022-01-02 – 2022-01-03 (×2): 1 mg via ORAL
  Filled 2022-01-02 (×2): qty 1

## 2022-01-02 MED ORDER — ALBUTEROL SULFATE (2.5 MG/3ML) 0.083% IN NEBU
2.5000 mg | INHALATION_SOLUTION | Freq: Four times a day (QID) | RESPIRATORY_TRACT | Status: AC
  Administered 2022-01-02 (×2): 2.5 mg via RESPIRATORY_TRACT
  Filled 2022-01-02 (×2): qty 3

## 2022-01-02 MED ORDER — ALBUTEROL SULFATE (2.5 MG/3ML) 0.083% IN NEBU
5.0000 mg | INHALATION_SOLUTION | Freq: Once | RESPIRATORY_TRACT | Status: AC
  Administered 2022-01-02: 5 mg via RESPIRATORY_TRACT
  Filled 2022-01-02: qty 6

## 2022-01-02 MED ORDER — SODIUM CHLORIDE 0.9% FLUSH
3.0000 mL | Freq: Two times a day (BID) | INTRAVENOUS | Status: DC
  Administered 2022-01-02 – 2022-01-07 (×8): 3 mL via INTRAVENOUS

## 2022-01-02 MED ORDER — IPRATROPIUM-ALBUTEROL 0.5-2.5 (3) MG/3ML IN SOLN
3.0000 mL | Freq: Once | RESPIRATORY_TRACT | Status: AC
  Administered 2022-01-02: 3 mL via RESPIRATORY_TRACT
  Filled 2022-01-02: qty 3

## 2022-01-02 MED ORDER — LORAZEPAM 1 MG PO TABS: 1.0000 mg | ORAL_TABLET | ORAL | Status: AC | PRN

## 2022-01-02 NOTE — Progress Notes (Signed)
Received patient in bed to unit.  Alert and oriented.  Informed consent signed and in chart.   Treatment initiated: 1229 Treatment completed: 1629  Patient tolerated well.  Transported back to the room  Alert, without acute distress.  Hand-off given to patient's nurse.   Access used: LUAF Access issues: none  Total UF removed: 4000 Medication(s) given: 76m benadryl Post HD VS: P 91, R 19, b/p 158/77, T 97.6 Post HD weight: not obtained   [facility]

## 2022-01-02 NOTE — Assessment & Plan Note (Signed)
Continuing home regimen of daily PPI therapy.  

## 2022-01-02 NOTE — Assessment & Plan Note (Signed)
·   Please see assessment and plan above °

## 2022-01-02 NOTE — Plan of Care (Signed)
  Problem: Education: Goal: Knowledge of General Education information will improve Description Including pain rating scale, medication(s)/side effects and non-pharmacologic comfort measures Outcome: Progressing   Problem: Health Behavior/Discharge Planning: Goal: Ability to manage health-related needs will improve Outcome: Progressing   

## 2022-01-02 NOTE — Assessment & Plan Note (Signed)
   Patient typically receives hemodialysis on Mondays Wednesdays and Fridays  Patient missed the last 2 hemodialysis sessions due to his car being broken down  Consulting nephrology in the morning for resumption of hemodialysis while hospitalized considering patient's substantial volume overload  Monitoring renal function and electrolytes with serial chemistries  Fluid restricted renal diet

## 2022-01-02 NOTE — Assessment & Plan Note (Signed)
   Exacerbation of patient's chronic hyponatremia.  Patient likely has chronic hyponatremia due to known history of cirrhosis which is now exacerbated to a sodium of 120 due to severe volume overload  Correction of volume status with hemodialysis should help to substantially improve sodium levels to what seems to be baseline of 128-130  Monitoring sodium levels with serial chemistries

## 2022-01-02 NOTE — Progress Notes (Signed)
PROGRESS NOTE        PATIENT DETAILS Name: Daniel [name] Age: 61 y.o. Sex: male Date of Birth: [birth year] Admit Date: 01/01/2022 Admitting Physician Vernelle Emerald, MD QPR:FFMBWGY, Milford Cage, NP  Brief Summary: Patient is a 61 y.o.  male with a significant past medical history including ESRD on dialysis (HD MWF), Hepatitis C cirrhosis, subarachnoid hemorrhage (2019), PVD with (s/p right SFA stent) and s/p left BKA, diastolic CHF, pulmonary hypertension, GERD, nicotine dependence (cigarettes) and hepatocellular carcinoma (07/2021) presented to the ED for shortness of breath, chest and back pain after missing 2 sessions of his routine dialysis d/t his car breaking down. Pt is found to be fluid volume overloaded and further chest imaging reveals bilateral pleural effusions and significant pulmonary edema. Pt chemistry also reveal substantial worsening chronic hyponatremia with sodium of 120 121 this am. Nephrology consulted and following.   Significant events: 09/06 >> Pt presented to the ED for SOB, volume overload after missing 2 sessions of dialysis.   Significant studies: 09/06 >> CXR: Increased pleural effusions, vascular congestion. Shifting airspace disease. Improvement in left perihilar opacities, patchy airspace disease at the right lung base has worsened.  Significant lab data: 09/06 >> Sodium 121, BNP 1034.3, Anion Gap 17, BUN 49, creatinine 8.91, Plt 97  Procedures: HD dialysis  Consults: Nephrology  Subjective: Pt sitting up on stretcher eating breakfast. Pt states " I didn't have a way to get to my dialysis". Pt endorses SOB, denies any chest pain.  Objective: Pt assessed tachypnea, dyspneic with some shortness of breath, not on oxygen but 99% RA on the monitor. Vitals: Blood pressure (!) 154/67, pulse 84, temperature 98 F (36.7 C), temperature source Oral, resp. rate (!) 24, SpO2 100 %.   Exam: Gen Exam: Alert, awake,  cooperative HEENT: atraumatic, neck supple Chest: B/L crackles, SOB, dyspnea, tachypnea  CVS: S1 S2 noted Abdomen: obese, soft non tender, distended Extremities: MAE X 4, Left BKA, RLE + 3 pitting edema,  Neurology: A&O x4, Non focal Skin: warm, color normal, no rash  Pertinent Labs/Radiology:    Latest Ref Rng & Units 01/02/2022    7:00 AM 01/02/2022    6:55 AM 01/01/2022    8:18 PM  CBC  WBC 4.0 - 10.5 K/uL  3.7    Hemoglobin 13.0 - 17.0 g/dL 9.9  9.3  9.2   Hematocrit 39.0 - 52.0 % 29.0  26.2  27.0   Platelets 150 - 400 K/uL  97      Lab Results  Component Value Date   NA 121 (L) 01/02/2022   K 4.3 01/02/2022   CL 83 (L) 01/02/2022   CO2 19 (L) 01/02/2022     Assessment and Plan: Fluid overload: pt has past medical history of ESRD and on HD dialysis, diastolic CHF with BNP 6599.3. Pt presented with SOB, missed his routine 2 sessions of HD dialysis, CXR shows pulmonary edema and bilateral pleural effusions. Nephrology consulted and following. - HD dialysis: appreciate and defer to Nephrology  - continue bronchodilators and nebulizer treatment for wheezing - continue telemetry monitor - repeat BMP   ESRD (end stage renal disease) on dialysis [facility]): Patient typically receives hemodialysis on Mondays Wednesdays and Fridays but report he missed the last 2 hemodialysis sessions due to his car being broken down. - Nephrology consulted and following. - continue to monitor renal  function and electrolytes  - repeat serial chemistries/BMP - continue strict fluid restriction and renal diet - daily weight  Bilateral pleural effusion - Please see assessment and plan above  Acute pulmonary edema  - Please see assessment and plan above  Hyponatremia: Exacerbation of patient's chronic hyponatremia. Patient likely has chronic hyponatremia due to known history of cirrhosis which is now exacerbated to a sodium of 120 due to severe volume overload. Pt baseline seems to be 128 -130. -  Appreciate nephrology following - Correction of volume status with hemodialysis should help to substantially improve sodium levels to what seems to be baseline. - continue monitoring sodium levels with serial chemistries  Hyperlipidemia - Continue statin therapy, atorvastatin 40 mg daily.  Hepatocellular carcinoma: Diagnosis 07/2021, Status post Y90 ablation 11/28/2021 - Outpatient follow-up with [name] - No active treatment otherwise  GERD without esophagitis - PPI therapy, continue Protonix 40 mg daily  Acute on chronic diastolic CHF (congestive heart failure): pt presented with SOB, fluid overload, BNP 1034.3. Hopeful for some improvement after HD dialysis. - Please see assessment and plan above - continue telemetry monitoring. - reassess after dialysis and repeat CMP in morning  Nicotine dependence, cigarettes, uncomplicated: pt continue to smoke everyday. Patient is counseled on smoking cessation. - Continue nicotine patch - Provide pt with smoking cessation information. - consult social worker  Alcohol abuse: pt is noncompliant with his longstanding history of alcohol abuse and he report he continues to use alcohol  -  continue initiated CIWA protocol  - continue benzodiazepines prn for evidence of withdrawal - continue multivitamin, thiamine and folic acid supplementation - continue regular counseling on cessation - social worker consulted for substance abuse  Essential hypertension: BP normative and may improve after HD dialysis  - continue oral antihypertensives, continue Norvasc and hydralazine - continue to monitor BP and adjust medications as needed  BMI/Obesity: Estimated body mass index is 30.01 kg/m as calculated from the following:   Height as of 12/27/21: _0  (1.803 m).   Weight as of 12/27/21: 97.6 kg.   Code status:   Code Status: Full Code   DVT Prophylaxis: heparin injection 5,000 Units Start: 01/02/22 0600    Family Communication: None at  bedside   Disposition Plan: Status is: Observation The patient remains OBS appropriate and will d/c before 2 midnights.   Planned Discharge Destination:Home  Diet: Diet Order             Diet renal with fluid restriction Fluid restriction: 1200 mL Fluid; Room service appropriate? Yes; Fluid consistency: Thin  Diet effective now                   Antimicrobial agents: Anti-infectives (From admission, onward)    Start     Dose/Rate Route Frequency Ordered Stop   01/02/22 0245  cefTRIAXone (ROCEPHIN) 1 g in sodium chloride 0.9 % 100 mL IVPB        1 g 200 mL/hr over 30 Minutes Intravenous  Once 01/02/22 0238 01/02/22 0325   01/02/22 0245  doxycycline (VIBRA-TABS) tablet 100 mg        100 mg Oral  Once 01/02/22 0238 01/02/22 0254      MEDICATIONS: Scheduled Meds:  albuterol  2.5 mg Nebulization Q6H   amLODipine  5 mg Oral Daily   atorvastatin  40 mg Oral Daily   Chlorhexidine Gluconate Cloth  6 each Topical P3295   folic acid  1 mg Oral Daily   heparin  5,000 Units Subcutaneous  Q8H   multivitamin with minerals  1 tablet Oral Daily   nicotine  21 mg Transdermal Daily   pantoprazole  40 mg Oral BID AC   sodium chloride flush  3 mL Intravenous Q12H   thiamine  100 mg Oral Daily   Continuous Infusions: PRN Meds:.acetaminophen **OR** acetaminophen, albuterol, hydrALAZINE, LORazepam **OR** LORazepam, ondansetron **OR** ondansetron (ZOFRAN) IV, polyethylene glycol, traZODone   I have personally reviewed following labs and imaging studies  LABORATORY DATA: CBC: Recent Labs  Lab 12/27/21 0727 01/01/22 1955 01/01/22 2018 01/02/22 0655 01/02/22 0700  WBC 2.4* 3.9*  --  3.7*  --   NEUTROABS  --   --   --  3.0  --   HGB 8.1* 8.6* 9.2* 9.3* 9.9*  HCT 23.8* 24.5* 27.0* 26.2* 29.0*  MCV 97.5 93.9  --  94.9  --   PLT 108* 96*  --  97*  --     Basic Metabolic Panel: Recent Labs  Lab 12/27/21 0726 01/01/22 1955 01/01/22 2018 01/02/22 0655 01/02/22 0700  NA 128*  120* 120* 119* 121*  K 3.7 4.4 4.6 4.0 4.3  CL 92* 85* 90* 83*  --   CO2 25 21*  --  19*  --   GLUCOSE 148* 91 91 137*  --   BUN 20 44* 40* 49*  --   CREATININE 5.26* 8.68* 9.30* 8.91*  --   CALCIUM 8.4* 8.5*  --  8.5*  --   PHOS 2.7  --   --   --   --     GFR: Estimated Creatinine Clearance: 10.2 mL/min (A) (by C-G formula based on SCr of 8.91 mg/dL (H)).  Liver Function Tests: Recent Labs  Lab 12/27/21 0726 01/01/22 1955 01/02/22 0655  AST  --  39 42*  ALT  --  13 13  ALKPHOS  --  100 108  BILITOT  --  1.0 1.2  PROT  --  7.4 8.2*  ALBUMIN 2.2* 2.2* 2.4*   No results for input(s): "LIPASE", "AMYLASE" in the last 168 hours. No results for input(s): "AMMONIA" in the last 168 hours.  Coagulation Profile: No results for input(s): "INR", "PROTIME" in the last 168 hours.  Cardiac Enzymes: No results for input(s): "CKTOTAL", "CKMB", "CKMBINDEX", "TROPONINI" in the last 168 hours.  BNP (last 3 results) No results for input(s): "PROBNP" in the last 8760 hours.  Lipid Profile: No results for input(s): "CHOL", "HDL", "LDLCALC", "TRIG", "CHOLHDL", "LDLDIRECT" in the last 72 hours.  Thyroid Function Tests: No results for input(s): "TSH", "T4TOTAL", "FREET4", "T3FREE", "THYROIDAB" in the last 72 hours.  Anemia Panel: No results for input(s): "VITAMINB12", "FOLATE", "FERRITIN", "TIBC", "IRON", "RETICCTPCT" in the last 72 hours.  Urine analysis:    Component Value Date/Time   COLORURINE YELLOW 12/09/2021 0900   APPEARANCEUR CLEAR 12/09/2021 0900   LABSPEC 1.008 12/09/2021 0900   PHURINE 5.0 12/09/2021 0900   GLUCOSEU NEGATIVE 12/09/2021 0900   HGBUR MODERATE (A) 12/09/2021 0900   BILIRUBINUR NEGATIVE 12/09/2021 0900   KETONESUR NEGATIVE 12/09/2021 0900   PROTEINUR 100 (A) 12/09/2021 0900   UROBILINOGEN 0.2 12/31/2009 0746   NITRITE NEGATIVE 12/09/2021 0900   LEUKOCYTESUR NEGATIVE 12/09/2021 0900    Sepsis Labs: Lactic Acid, Venous    Component Value Date/Time    LATICACIDVEN 1.1 10/06/2021 0935    MICROBIOLOGY: Recent Results (from the past 240 hour(s))  Fungus Culture With Stain     Status: None (Preliminary result)   Collection Time: 12/26/21  3:46 PM  Specimen: Bronchial Alveolar Lavage; Respiratory  Result Value Ref Range Status   Fungus Stain Final report  Final    Comment: (NOTE) Performed At: [facility] [place], [place] [id number] Rush Farmer MD [id number]    Fungus (Mycology) Culture PENDING  Incomplete   Fungal Source BRONCHIAL ALVEOLAR LAVAGE  Final    Comment: Performed at [facility], [place] [address], [place], [place] [address]  Culture, BAL-quantitative w Gram Stain     Status: None   Collection Time: 12/26/21  3:46 PM   Specimen: Bronchial Alveolar Lavage; Respiratory  Result Value Ref Range Status   Specimen Description BRONCHIAL ALVEOLAR LAVAGE  Final   Special Requests NONE  Final   Gram Stain NO WBC SEEN NO ORGANISMS SEEN   Final   Culture   Final    NO GROWTH 2 DAYS Performed at [facility], [address], [place], [place] [address]    Report Status 12/29/2021 FINAL  Final  Culture, Respiratory w Gram Stain     Status: None   Collection Time: 12/26/21  3:46 PM   Specimen: Bronchial Alveolar Lavage; Respiratory  Result Value Ref Range Status   Specimen Description BRONCHIAL ALVEOLAR LAVAGE  Final   Special Requests NONE  Final   Gram Stain NO WBC SEEN NO ORGANISMS SEEN   Final   Culture   Final    NO GROWTH 2 DAYS Performed at [facility], [address] [place], [place] [address]    Report Status 12/29/2021 FINAL  Final  Aerobic/Anaerobic Culture w Gram Stain (surgical/deep wound)     Status: None   Collection Time: 12/26/21  3:46 PM   Specimen: Bronchial Alveolar Lavage; Respiratory  Result Value Ref Range Status   Specimen Description BRONCHIAL ALVEOLAR LAVAGE  Final   Special Requests NONE  Final   Gram Stain NO WBC SEEN NO ORGANISMS SEEN   Final    Culture   Final    No growth aerobically or anaerobically. Performed at [facility], [place] [address], [place], [place] [address]    Report Status 01/01/2022 FINAL  Final  Fungus Stain     Status: None   Collection Time: 12/26/21  3:46 PM   Specimen: Bronchial Alveolar Lavage; Respiratory  Result Value Ref Range Status   FUNGUS STAIN Final report  Final    Comment: (NOTE) Performed At: [facility] [address] [place], [place] [id number] Rush Farmer MD [id number]    Fungal Source BRONCHIAL ALVEOLAR LAVAGE  Final    Comment: Performed at [facility], [place] [address], [place], [place] [address]  Acid Fast Smear (AFB)     Status: None   Collection Time: 12/26/21  3:46 PM   Specimen: Bronchial Alveolar Lavage; Respiratory  Result Value Ref Range Status   AFB Specimen Processing Concentration  Final   Acid Fast Smear Negative  Final    Comment: (NOTE) Performed At: [facility] [place], [place] [id number] Rush Farmer MD [id number]    Source (AFB) BRONCHIAL ALVEOLAR LAVAGE  Final    Comment: Performed at [facility], [place] [address], [place],  [address]  Fungal Stain reflex     Status: None   Collection Time: 12/26/21  3:46 PM  Result Value Ref Range Status   Fungal stain result 1 Comment  Final    Comment: (NOTE) KOH/Calcofluor preparation:  no fungus observed. Performed At: [facility] [employer] [address]  [address] [place], [place] [id number] Rush Farmer MD [id number]   Fungus Culture Result     Status: None   Collection Time: 12/26/21  3:46 PM  Result Value Ref Range Status   Result 1 Comment  Final    Comment: (NOTE) KOH/Calcofluor preparation:  no fungus observed. Performed At: [facility] [place], [place] [id number] Rush Farmer MD [id number]     RADIOLOGY STUDIES/RESULTS: DG Chest 2 View  Result Date: 01/01/2022 CLINICAL DATA:  Shortness of breath.  Missed  dialysis. EXAM: CHEST - 2 VIEW COMPARISON:  Radiograph 12/26/2021, CT 12/25/2021 FINDINGS: Chronic cardiomegaly. Unchanged mediastinal contours with aortic atherosclerosis and tortuosity. Right greater than left pleural effusions with slight increase. Patchy airspace disease in the left mid lung persists but has decreased in density. Patchy airspace disease at the right lung base has worsened. Vascular congestion. No pneumothorax. Left axillary vascular stent. IMPRESSION: 1. Increased pleural effusions.  Vascular congestion. 2. Shifting airspace disease. Improvement in left perihilar opacities, patchy airspace disease at the right lung base has worsened. Electronically Signed   By: Keith Rake M.D.   On: 01/01/2022 20:38     LOS: 0 days   Tanda Rockers, AGACNP Student  [facility]   01/02/2022, 9:54 AM

## 2022-01-02 NOTE — ED Notes (Signed)
Pt called out for SOB saturatoins are 99% RA  placed on 2L to see if gave him relief.  Pt was reluctant at first and still states it doesn't help but is wearing the O2 for now I will go get breathing treatment which pt states help pt refuses to allow [place] to take BP no the upper arm will only allow on the right lower arm

## 2022-01-02 NOTE — ED Notes (Signed)
ED Provider at bedside. 

## 2022-01-02 NOTE — ED Notes (Signed)
Consent signed and belongs sent with pt.

## 2022-01-02 NOTE — ED Notes (Signed)
Pt. Placement notified

## 2022-01-02 NOTE — Assessment & Plan Note (Signed)
.   Patient is being counseled daily on smoking cessation. . Providing patient with nicotine replacement therapy during this hospitalization.   

## 2022-01-02 NOTE — Assessment & Plan Note (Signed)
.   Resume patients home regimen of oral antihypertensives as patient seems to typically be quite noncompliant . Titrate antihypertensive regimen as necessary to achieve adequate BP control . PRN intravenous antihypertensives for excessively elevated blood pressure

## 2022-01-02 NOTE — H&P (Signed)
History and Physical    Patient: Daniel [name] MRN: [id number] DOA: 01/01/2022  Date of Service: the patient was seen and examined on 01/02/2022  Patient coming from: Home  Chief Complaint:  Chief Complaint  Patient presents with   Shortness of Breath    HPI:   61 year old male with past medical history of Hepatitis C cirrhosis,  hepatocellular carcinoma (Dx 07/2021, follows with [name], S/P Y90 ablation 11/28/2021), iron deficiency anemia, GAVE and duodenal AVMs, ESRD (HD MWF), history of subarachnoid hemorrhage (2019), peripheral vascular disease (S/P right SFA stent), status post left BKA, diastolic congestive heart failure, pulmonary hypertension, nicotine dependence (cigarettes), gastroesophageal reflux disease who presents to [facility] emergency department with complaints of shortness of breath.  Of note, patient was recently hospitalized at [facility] from 8/30 until 9/1.  Patient presented with shortness of breath found to have multifocal infiltrates thought to be multifactorial secondary to volume overload with possible superimposed multifocal pneumonia.  Pulmonology was consulted for further evaluation of these multifocal infiltrates and eventually underwent bronchoscopy on 8/31 with Dr. Lake Bells with pathology negative for granulomas/malignancy and all cultures without growth.  Patient additionally underwent hemodialysis with dramatic improvement in symptoms and was eventually discharged on 9/1.  Patient reports that since he was recently discharged she has developed recurrent shortness of breath.  Shortness of breath is severe in intensity and worse with any movement whatsoever.  Patient complains of associated cough with intermittent anterior chest discomfort that he describes as nonradiating, moderate in intensity and sore in quality.  Patient denies sick contacts, recent travel or any associated fever.  Patient states that his symptoms have been so severe that  he has missed at least the past 2 dialysis sessions due to his car breaking down.  Patient reports that symptoms became so severe that he presented to [facility] emergency department for evaluation.  Upon evaluation in the emergency department chest imaging reveals bilateral pleural effusions and significant pulmonary edema with initial chemistry revealing substantial worsening of his chronic hyponatremia with a sodium of 120.  Due to concerns for recurrent volume overload the hospitalist group has been contacted to assess the patient for admission to the hospital.    Review of Systems: Review of Systems  Respiratory:  Positive for cough and shortness of breath.   Cardiovascular:  Positive for chest pain.  All other systems reviewed and are negative.    Past Medical History:  Diagnosis Date   Anemia    Diabetes mellitus without complication [facility])    patient denies   Dialysis patient [facility])    End stage chronic kidney disease ([place])    Hypertension    ICH (intracerebral hemorrhage) ([place]) 05/20/2017   PAD (peripheral artery disease) (HCC)    Shoulder pain, left 06/28/2013    Past Surgical History:  Procedure Laterality Date   A/V FISTULAGRAM N/A 08/15/2020   Procedure: A/V FISTULAGRAM - Left Upper;  Surgeon: Cherre Robins, MD;  Location: [place] CV LAB;  Service: Cardiovascular;  Laterality: N/A;   ABDOMINAL AORTOGRAM W/LOWER EXTREMITY N/A 04/08/2021   Procedure: ABDOMINAL AORTOGRAM W/LOWER EXTREMITY;  Surgeon: Waynetta Sandy, MD;  Location: [place] CV LAB;  Service: Cardiovascular;  Laterality: N/A;   AMPUTATION Left 04/16/2021   Procedure: LEFT BELOW KNEE AMPUTATION;  Surgeon: Angelia Mould, MD;  Location: [place];  Service: Vascular;  Laterality: Left;   AMPUTATION Left 06/17/2021   Procedure: REVISION AMPUTATION BELOW KNEE;  Surgeon: Broadus John, MD;  Location: [place] OR;  Service: Vascular;  Laterality: Left;   APPENDECTOMY     APPLICATION  OF WOUND VAC Left 06/17/2021   Procedure: APPLICATION OF WOUND VAC;  Surgeon: Broadus John, MD;  Location: [place];  Service: Vascular;  Laterality: Left;   AV FISTULA PLACEMENT Left 08/03/2019   Procedure: LEFT ARM ARTERIOVENOUS (AV) CEPHALIC  FISTULA CREATION;  Surgeon: Waynetta Sandy, MD;  Location: [place];  Service: Vascular;  Laterality: Left;   BIOPSY  06/30/2019   Procedure: BIOPSY;  Surgeon: Ronald Lobo, MD;  Location: [facility] ENDOSCOPY;  Service: Endoscopy;;   BIOPSY  08/02/2019   Procedure: BIOPSY;  Surgeon: Yetta Flock, MD;  Location: [facility] ENDOSCOPY;  Service: Gastroenterology;;   BIOPSY  02/08/2021   Procedure: BIOPSY;  Surgeon: Sharyn Creamer, MD;  Location: [facility] ENDOSCOPY;  Service: Gastroenterology;;   BIOPSY  02/24/2021   Procedure: BIOPSY;  Surgeon: [name], MD;  Location: [facility] ENDOSCOPY;  Service: Gastroenterology;;   BIOPSY  03/26/2021   Procedure: BIOPSY;  Surgeon: Lavena Bullion, DO;  Location: [place];  Service: Gastroenterology;;   BIOPSY  08/06/2021   Procedure: BIOPSY;  Surgeon: [name], MD;  Location: [place];  Service: Gastroenterology;;   BIOPSY  09/15/2021   Procedure: BIOPSY;  Surgeon: Sharyn Creamer, MD;  Location: [place];  Service: Gastroenterology;;   BRONCHIAL BIOPSY  12/26/2021   Procedure: BRONCHIAL BIOPSIES;  Surgeon: Juanito Doom, MD;  Location: [place];  Service: Cardiopulmonary;;   BRONCHIAL WASHINGS  12/26/2021   Procedure: BRONCHIAL WASHINGS;  Surgeon: Juanito Doom, MD;  Location: [place];  Service: Cardiopulmonary;;   COLONOSCOPY  01/23/2012   Procedure: COLONOSCOPY;  Surgeon: Danie Binder, MD;  [facility];  Service: Endoscopy;  Laterality: N/A;  11:10 AM   COLONOSCOPY WITH PROPOFOL N/A 06/30/2019   Procedure: COLONOSCOPY WITH PROPOFOL;  Surgeon: Ronald Lobo, MD;  Location: [place];  Service: Endoscopy;  Laterality: N/A;   ENTEROSCOPY N/A 08/02/2019    Procedure: ENTEROSCOPY;  Surgeon: Yetta Flock, MD;  Location: [facility] ENDOSCOPY;  Service: Gastroenterology;  Laterality: N/A;   ENTEROSCOPY N/A 02/24/2021   Procedure: ENTEROSCOPY;  Surgeon: [name], MD;  Location: [facility] ENDOSCOPY;  Service: Gastroenterology;  Laterality: N/A;   ENTEROSCOPY N/A 03/26/2021   Procedure: ENTEROSCOPY;  Surgeon: Lavena Bullion, DO;  Location: [place];  Service: Gastroenterology;  Laterality: N/A;   ENTEROSCOPY N/A 07/05/2021   Procedure: ENTEROSCOPY;  Surgeon: Carol Ada, MD;  Location: [place];  Service: Gastroenterology;  Laterality: N/A;   ENTEROSCOPY N/A 08/06/2021   Procedure: ENTEROSCOPY;  Surgeon: [name], MD;  Location: [facility] ENDOSCOPY;  Service: Gastroenterology;  Laterality: N/A;   ENTEROSCOPY N/A 08/24/2021   Procedure: ENTEROSCOPY;  Surgeon: Ladene Artist, MD;  Location: [facility] ENDOSCOPY;  Service: Gastroenterology;  Laterality: N/A;   ENTEROSCOPY N/A 09/15/2021   Procedure: ENTEROSCOPY;  Surgeon: Sharyn Creamer, MD;  Location: [facility] ENDOSCOPY;  Service: Gastroenterology;  Laterality: N/A;   ENTEROSCOPY N/A 10/06/2021   Procedure: ENTEROSCOPY;  Surgeon: [name], MD;  Location: [facility] ENDOSCOPY;  Service: Gastroenterology;  Laterality: N/A;   ENTEROSCOPY N/A 11/14/2021   Procedure: ENTEROSCOPY;  Surgeon: Doran Stabler, MD;  Location: [facility] ENDOSCOPY;  Service: Gastroenterology;  Laterality: N/A;   ESOPHAGOGASTRODUODENOSCOPY N/A 08/10/2020   Procedure: ESOPHAGOGASTRODUODENOSCOPY (EGD);  Surgeon: Jerene Bears, MD;  Location: [facility] ENDOSCOPY;  Service: Gastroenterology;  Laterality: N/A;   ESOPHAGOGASTRODUODENOSCOPY (EGD) WITH PROPOFOL N/A 06/30/2019  Procedure: ESOPHAGOGASTRODUODENOSCOPY (EGD) WITH PROPOFOL;  Surgeon: Ronald Lobo, MD;  Location: [place];  Service: Endoscopy;  Laterality: N/A;   ESOPHAGOGASTRODUODENOSCOPY (EGD) WITH PROPOFOL N/A 01/12/2021   Procedure: ESOPHAGOGASTRODUODENOSCOPY (EGD) WITH  PROPOFOL;  Surgeon: Sharyn Creamer, MD;  Location: [place];  Service: Gastroenterology;  Laterality: N/A;   ESOPHAGOGASTRODUODENOSCOPY (EGD) WITH PROPOFOL N/A 02/08/2021   Procedure: ESOPHAGOGASTRODUODENOSCOPY (EGD) WITH PROPOFOL;  Surgeon: Sharyn Creamer, MD;  Location: [place];  Service: Gastroenterology;  Laterality: N/A;   FISTULA SUPERFICIALIZATION Left 10/17/2019   Procedure: LEFT UPPER EXTREMITY FISTULA REVISION, SIDE BRANCH LIGATION,  AND SUPERFICIALIZATION;  Surgeon: Marty Heck, MD;  Location: [place];  Service: Vascular;  Laterality: Left;   FISTULA SUPERFICIALIZATION Left 40/98/1191   Procedure: PLICATION OF ANEURYSM LEFT ARTERIOVENOUS FISTULA;  Surgeon: Angelia Mould, MD;  Location: [place];  Service: Vascular;  Laterality: Left;   FLEXIBLE SIGMOIDOSCOPY N/A 07/05/2021   Procedure: FLEXIBLE SIGMOIDOSCOPY;  Surgeon: Carol Ada, MD;  Location: [place];  Service: Gastroenterology;  Laterality: N/A;   GIVENS CAPSULE STUDY N/A 06/30/2019   Procedure: GIVENS CAPSULE STUDY;  Surgeon: Ronald Lobo, MD;  Location: [place];  Service: Endoscopy;  Laterality: N/A;   GIVENS CAPSULE STUDY N/A 08/25/2021   Procedure: GIVENS CAPSULE STUDY;  Surgeon: Ladene Artist, MD;  Location: [place];  Service: Gastroenterology;  Laterality: N/A;   GIVENS CAPSULE STUDY N/A 10/06/2021   Procedure: GIVENS CAPSULE STUDY;  Surgeon: [name], MD;  Location: [place];  Service: Gastroenterology;  Laterality: N/A;   HEMOSTASIS CLIP PLACEMENT  08/10/2020   Procedure: HEMOSTASIS CLIP PLACEMENT;  Surgeon: Jerene Bears, MD;  Location: [place];  Service: Gastroenterology;;   HEMOSTASIS CLIP PLACEMENT  01/12/2021   Procedure: HEMOSTASIS CLIP PLACEMENT;  Surgeon: Sharyn Creamer, MD;  Location: [place];  Service: Gastroenterology;;   HEMOSTASIS CONTROL  08/02/2019   Procedure: HEMOSTASIS CONTROL;  Surgeon: Yetta Flock, MD;  Location: [place];   Service: Gastroenterology;;   HOT HEMOSTASIS  02/24/2021   Procedure: HOT HEMOSTASIS (ARGON PLASMA COAGULATION/BICAP);  Surgeon: [name], MD;  Location: [facility] ENDOSCOPY;  Service: Gastroenterology;;   HOT HEMOSTASIS N/A 03/26/2021   Procedure: HOT HEMOSTASIS (ARGON PLASMA COAGULATION/BICAP);  Surgeon: Lavena Bullion, DO;  Location: [facility] ENDOSCOPY;  Service: Gastroenterology;  Laterality: N/A;   HOT HEMOSTASIS N/A 07/05/2021   Procedure: HOT HEMOSTASIS (ARGON PLASMA COAGULATION/BICAP);  Surgeon: Carol Ada, MD;  Location: [place];  Service: Gastroenterology;  Laterality: N/A;   HOT HEMOSTASIS N/A 09/15/2021   Procedure: HOT HEMOSTASIS (ARGON PLASMA COAGULATION/BICAP);  Surgeon: Sharyn Creamer, MD;  Location: [place];  Service: Gastroenterology;  Laterality: N/A;   HOT HEMOSTASIS N/A 10/06/2021   Procedure: HOT HEMOSTASIS (ARGON PLASMA COAGULATION/BICAP);  Surgeon: [name], MD;  Location: [place];  Service: Gastroenterology;  Laterality: N/A;   INCISION AND DRAINAGE ABSCESS N/A 06/29/2016   Procedure: INCISION AND DRAINAGE ABDOMINAL WALL ABSCESS;  Surgeon: Alphonsa Overall, MD;  Location: [facility];  Service: General;  Laterality: N/A;   INSERTION OF DIALYSIS CATHETER Right 08/03/2019   Procedure: INSERTION OF DIALYSIS CATHETER;  Surgeon: Waynetta Sandy, MD;  Location: [place];  Service: Vascular;  Laterality: Right;   INSERTION OF DIALYSIS CATHETER Right 10/22/2019   Procedure: INSERTION OF 23CM TUNNELED DIALYSIS CATHETER RIGHT INTERNAL JUGULAR;  Surgeon: Angelia Mould, MD;  Location: [place];  Service: Vascular;  Laterality: Right;   INSERTION OF DIALYSIS CATHETER Right 08/12/2020   Procedure: INSERTION OF Right internal Jugular  TUNNELED  DIALYSIS CATHETER.;  Surgeon: Waynetta Sandy, MD;  Location: [place];  Service: Vascular;  Laterality: Right;   INSERTION OF DIALYSIS CATHETER N/A 04/19/2021   Procedure: INSERTION OF TUNNELED DIALYSIS CATHETER;   Surgeon: Angelia Mould, MD;  Location: [facility];  Service: Vascular;  Laterality: N/A;   IR 3D INDEPENDENT WKST  11/07/2021   IR ANGIOGRAM SELECTIVE EACH ADDITIONAL VESSEL  11/07/2021   IR ANGIOGRAM SELECTIVE EACH ADDITIONAL VESSEL  11/07/2021   IR ANGIOGRAM SELECTIVE EACH ADDITIONAL VESSEL  11/07/2021   IR ANGIOGRAM SELECTIVE EACH ADDITIONAL VESSEL  11/28/2021   IR ANGIOGRAM VISCERAL SELECTIVE  11/07/2021   IR ANGIOGRAM VISCERAL SELECTIVE  11/28/2021   IR EMBO ARTERIAL NOT HEMORR HEMANG INC GUIDE ROADMAPPING  11/07/2021   IR EMBO TUMOR ORGAN ISCHEMIA INFARCT INC GUIDE ROADMAPPING  11/28/2021   IR RADIOLOGIST EVAL & MGMT  09/12/2021   IR US GUIDE VASC ACCESS LEFT  11/07/2021   IR US GUIDE VASC ACCESS LEFT  11/28/2021   Left heel surgery     LOWER EXTREMITY ANGIOGRAPHY N/A 07/08/2021   Procedure: Lower Extremity Angiography;  Surgeon: Cherre Robins, MD;  Location: [place] CV LAB;  Service: Cardiovascular;  Laterality: N/A;   PENILE BIOPSY N/A 03/26/2020   Procedure: PENILE ULCER DEBRIDEMENT;  Surgeon: Remi Haggard, MD;  Location: [facility];  Service: Urology;  Laterality: N/A;  30 MINS   PERIPHERAL VASCULAR INTERVENTION Right 07/08/2021   Procedure: PERIPHERAL VASCULAR INTERVENTION;  Surgeon: Cherre Robins, MD;  Location: [place] CV LAB;  Service: Cardiovascular;  Laterality: Right;   SCLEROTHERAPY  01/12/2021   Procedure: SCLEROTHERAPY;  Surgeon: Sharyn Creamer, MD;  Location: [facility] ENDOSCOPY;  Service: Gastroenterology;;   Clide Deutscher  02/24/2021   Procedure: Clide Deutscher;  Surgeon: [name], MD;  Location: [place];  Service: Gastroenterology;;   SUBMUCOSAL TATTOO INJECTION  08/24/2021   Procedure: SUBMUCOSAL TATTOO INJECTION;  Surgeon: Ladene Artist, MD;  Location: [place];  Service: Gastroenterology;;   SUBMUCOSAL TATTOO INJECTION  09/15/2021   Procedure: SUBMUCOSAL TATTOO INJECTION;  Surgeon: Sharyn Creamer, MD;  Location: [place];  Service:  Gastroenterology;;   VIDEO BRONCHOSCOPY N/A 12/26/2021   Procedure: VIDEO BRONCHOSCOPY WITH FLUORO;  Surgeon: Juanito Doom, MD;  Location: [place];  Service: Cardiopulmonary;  Laterality: N/A;    Social History:  reports that he has been smoking cigarettes. He has a 22.50 pack-year smoking history. He has never used smokeless tobacco. He reports that he does not currently use alcohol. He reports that he does not currently use drugs after having used the following drugs: "Crack" cocaine.  Allergies  Allergen Reactions   Seroquel [Quetiapine] Other (See Comments)    Tardive kinesia Dystonia    Dilaudid [Hydromorphone Hcl] Itching and Other (See Comments)    Pt reports itchiness after IM injection     Family History  Problem Relation Age of Onset   Colon cancer Neg Hx     Prior to Admission medications   Medication Sig Start Date End Date Taking? Authorizing Provider  amLODipine (NORVASC) 5 MG tablet Take 1 tablet (5 mg total) by mouth daily. Patient not taking: Reported on 12/25/2021 09/18/21 12/26/22  [place] Eric J, DO  atorvastatin (LIPITOR) 40 MG tablet Take 1 tablet (40 mg total) by mouth daily. Patient not taking: Reported on 12/25/2021 09/18/21 12/26/22  [place] Donnamarie Poag, DO  folic acid (FOLVITE) 1 MG tablet Take 1 tablet (1 mg total) by mouth daily. Patient not taking:  Reported on 12/24/2021 12/12/21   Eugenie Filler, MD  gabapentin (NEURONTIN) 300 MG capsule Take 1 capsule (300 mg total) by mouth at bedtime. Patient taking differently: Take 300 mg by mouth at bedtime as needed (for nerve pain). 11/15/21   Nita Sells, MD  Multiple Vitamin (MULTIVITAMIN WITH MINERALS) TABS tablet Take 1 tablet by mouth daily. Patient not taking: Reported on 12/24/2021 12/12/21   Eugenie Filler, MD  pantoprazole (PROTONIX) 40 MG tablet Take 1 tablet (40 mg total) by mouth 2 (two) times daily before a meal. Patient not taking: Reported on 12/25/2021 10/10/21 01/08/22  Mercy Riding,  MD  thiamine (VITAMIN B1) 100 MG tablet Take 1 tablet (100 mg total) by mouth daily. Patient not taking: Reported on 12/25/2021 12/12/21   Eugenie Filler, MD  traZODone (DESYREL) 50 MG tablet Take 1 tablet (50 mg total) by mouth at bedtime as needed for sleep. Patient not taking: Reported on 12/24/2021 11/15/21   Nita Sells, MD  colchicine 0.6 MG tablet Take 0.5 tablets (0.3 mg total) by mouth 2 (two) times daily. 07/24/20 07/29/20  Noemi Chapel, MD  furosemide (LASIX) 40 MG tablet Take 1 tablet (40 mg total) by mouth daily. 07/25/19 02/09/20  Ladona Horns, MD    Physical Exam:  Vitals:   01/02/22 0208 01/02/22 0233 01/02/22 0300 01/02/22 0415  BP: (!) 140/115  (!) 154/85 (!) 173/107  Pulse: 89  88 88  Resp: 19  (!) 25 11  Temp:  97.8 F (36.6 C)    TempSrc:  Oral    SpO2: 98%  98% 98%    Constitutional: Awake alert and oriented x3, patient is in respiratory distress Skin: no rashes, no lesions, good skin turgor noted. Eyes: Pupils are equally reactive to light.  No evidence of scleral icterus or conjunctival pallor.  ENMT: Moist mucous membranes noted.  Posterior pharynx clear of any exudate or lesions.   Neck: normal, supple, no masses, no thyromegaly.  Notable jugular venous distention at 45 degrees. Respiratory: Inspiratory and expiratory wheezing noted in all fields.  Notable bibasilar mid field rales.  Increased respiratory effort without accessory muscle use.  Cardiovascular: Regular rate and rhythm, no murmurs / rubs / gallops.  Extensive right lower extremity pitting edema that tracks up to the thigh.  2+ pedal pulses. No carotid bruits.  Chest:   Nontender without crepitus or deformity.   Back:   Nontender without crepitus or deformity. Abdomen: Is protuberant but soft and nontender.  No evidence of intra-abdominal masses.  Positive bowel sounds noted in all quadrants.   Musculoskeletal: Status post left BKA.  No joint deformity upper and lower extremities. Good ROM,  no contractures. Normal muscle tone.  Neurologic: CN 2-12 grossly intact. Sensation intact.  Patient moving all 4 extremities spontaneously.  Patient is following all commands.  Patient is responsive to verbal stimuli.   Psychiatric: Patient exhibits normal mood with appropriate affect.  Patient seems to possess insight as to their current situation.    Data Reviewed:  I have personally reviewed and interpreted labs, imaging.  Significant findings are:  CXR:  Chest X-ray was personally reviewed.  Significant bilateral patchy infiltrates with bilateral pleural effusions.  No evidence of pneumothorax.  Chemistry reveals sodium 120, potassium 4.4, chloride 8.5, bicarbonate 21, BUN 44, creatinine 8.68. White blood cell count of 3.9, hemoglobin 8.6, hematocrit 24.5, platelet count 96.  EKG: Personally reviewed.  Rhythm is normal sinus rhythm with heart rate of 93 bpm.  Notable Q waves  in the anterior septal leads and inferior leads.    Assessment and Plan: * Volume overload Patient presenting with respiratory distress, diffuse wheezing on exam and chest x-ray evidence of pulm edema and bilateral pleural effusions Patient's presentation is consistent with acute volume overload, likely multifactorial in the setting of missed hemodialysis and known history of diastolic congestive heart failure Placing patient on bronchodilator therapy due to substantial wheezing We will contact nephrology to urgently get patient hemodialysis for fluid removal Monitoring patient on telemetry And pending VBG If patient clinically worsens will initiate noninvasive positive pressure ventilation  Acute on chronic diastolic CHF (congestive heart failure) ([place]) Please see assessment and plan above  Hyponatremia Exacerbation of patient's chronic hyponatremia. Patient likely has chronic hyponatremia due to known history of cirrhosis which is now exacerbated to a sodium of 120 due to severe volume overload Correction  of volume status with hemodialysis should help to substantially improve sodium levels to what seems to be baseline of 128-130 Monitoring sodium levels with serial chemistries  Alcohol abuse Longstanding history of alcohol abuse Patient reports continued alcohol use Initiating CIWA protocol Administering as needed benzodiazepines for evidence of withdrawal Thiamine and folic acid supplementation Regular counseling on cessation  Bilateral pleural effusion Please see assessment and plan above  Acute pulmonary edema (HCC) Please see assessment and plan above  ESRD (end stage renal disease) on dialysis [facility]) Patient typically receives hemodialysis on Mondays Wednesdays and Fridays Patient missed the last 2 hemodialysis sessions due to his car being broken down Consulting nephrology in the morning for resumption of hemodialysis while hospitalized considering patient's substantial volume overload Monitoring renal function and electrolytes with serial chemistries Fluid restricted renal diet   Essential hypertension Resume patients home regimen of oral antihypertensives as patient seems to typically be quite noncompliant Titrate antihypertensive regimen as necessary to achieve adequate BP control PRN intravenous antihypertensives for excessively elevated blood pressure    Hepatocellular carcinoma ([place]) Outpatient follow-up with [name] Diagnosis 07/2021 Status post Y90 ablation 11/28/2021 No active treatment otherwise  Mixed hyperlipidemia Continue home regimen of statin therapy  GERD without esophagitis Continuing home regimen of daily PPI therapy.   Nicotine dependence, cigarettes, uncomplicated Patient is being counseled daily on smoking cessation. Providing patient with nicotine replacement therapy during this hospitalization.         Code Status:  Full code  code status decision has been confirmed with: patient Family Communication: deferred   Consults: Consulting  nephrology this morning for resumption of hemodialysis  Severity of Illness:  The appropriate patient status for this patient is OBSERVATION. Observation status is judged to be reasonable and necessary in order to provide the required intensity of service to ensure the patient's safety. The patient's presenting symptoms, physical exam findings, and initial radiographic and laboratory data in the context of their medical condition is felt to place them at decreased risk for further clinical deterioration. Furthermore, it is anticipated that the patient will be medically stable for discharge from the hospital within 2 midnights of admission.   Author:  Vernelle Emerald MD  01/02/2022 6:42 AM

## 2022-01-02 NOTE — Assessment & Plan Note (Signed)
   Outpatient follow-up with [name]  Diagnosis 07/2021  Status post Y90 ablation 11/28/2021  No active treatment otherwise

## 2022-01-02 NOTE — ED Notes (Signed)
This RN heard audible wheezing from the pt, pt repositioned and MD notified

## 2022-01-02 NOTE — Procedures (Signed)
   I was present at this dialysis session, have reviewed the session itself and made  appropriate changes Kelly Splinter MD [place] pager [phone]   01/02/2022, 2:17 PM

## 2022-01-02 NOTE — Consult Note (Addendum)
Renal Service Consult Note [facility]  Daniel [name] 01/02/2022 Sol Blazing, MD Requesting Physician: Dr. Sloan Leiter  Reason for Consult: ESRD pt w/ SOB, gen weakness HPI: The patient is a 61 y.o. year-old w/ hx of anemia, DM2, esrd on HD, HTN, PAD, L BKA presented to ED overnight w/ c/o SOB. Pt stated he missed last 2 HD sessions and was feeling SOB and generally feeling bad all over. In ED CXR showed pulm edema, Na 120, K wnl. Pt admitted to medical service. We are asked to see this am for dialysis.   Pt seen in ED room. Pt lives alone and drives himself to dialysis. His car broke down and he missed his last 2 HD sessions. He has  L BKA and is WC bound. He states he has a ramp in the back of his house, but the transport team "will not go to my back door", he states this has been an issue in the past.  He has steps in the front that are "too high" for a ramp to be built, but he says he doesn't know who would there be to build a ramp anyway.  Pt is WC bound, transfers by himself, takes care of his self. Car broke down recently as above.    ROS - denies CP, no joint pain, no HA, no blurry vision, no rash, no diarrhea, no nausea/ vomiting, no dysuria, no difficulty voiding   Past Medical History  Past Medical History:  Diagnosis Date   Anemia    Diabetes mellitus without complication [facility])    patient denies   Dialysis patient [facility])    End stage chronic kidney disease ([place])    Hypertension    ICH (intracerebral hemorrhage) ([place]) 05/20/2017   PAD (peripheral artery disease) (HCC)    Shoulder pain, left 06/28/2013   Past Surgical History  Past Surgical History:  Procedure Laterality Date   A/V FISTULAGRAM N/A 08/15/2020   Procedure: A/V FISTULAGRAM - Left Upper;  Surgeon: Cherre Robins, MD;  Location: [place] CV [facility];  Service: Cardiovascular;  Laterality: N/A;   ABDOMINAL AORTOGRAM W/LOWER EXTREMITY N/A 04/08/2021   Procedure: ABDOMINAL AORTOGRAM W/LOWER  EXTREMITY;  Surgeon: Waynetta Sandy, MD;  Location: [place] CV LAB;  Service: Cardiovascular;  Laterality: N/A;   AMPUTATION Left 04/16/2021   Procedure: LEFT BELOW KNEE AMPUTATION;  Surgeon: Angelia Mould, MD;  Location: [place];  Service: Vascular;  Laterality: Left;   AMPUTATION Left 06/17/2021   Procedure: REVISION AMPUTATION BELOW KNEE;  Surgeon: Broadus John, MD;  Location: [place];  Service: Vascular;  Laterality: Left;   APPENDECTOMY     APPLICATION OF WOUND VAC Left 06/17/2021   Procedure: APPLICATION OF WOUND VAC;  Surgeon: Broadus John, MD;  Location: [place];  Service: Vascular;  Laterality: Left;   AV FISTULA PLACEMENT Left 08/03/2019   Procedure: LEFT ARM ARTERIOVENOUS (AV) CEPHALIC  FISTULA CREATION;  Surgeon: Waynetta Sandy, MD;  Location: [place];  Service: Vascular;  Laterality: Left;   BIOPSY  06/30/2019   Procedure: BIOPSY;  Surgeon: Ronald Lobo, MD;  Location: [facility] ENDOSCOPY;  Service: Endoscopy;;   BIOPSY  08/02/2019   Procedure: BIOPSY;  Surgeon: Yetta Flock, MD;  Location: [facility] ENDOSCOPY;  Service: Gastroenterology;;   BIOPSY  02/08/2021   Procedure: BIOPSY;  Surgeon: Sharyn Creamer, MD;  Location: [facility] ENDOSCOPY;  Service: Gastroenterology;;   BIOPSY  02/24/2021   Procedure: BIOPSY;  Surgeon: [name], MD;  Location: [place] ENDOSCOPY;  Service: Gastroenterology;;   BIOPSY  03/26/2021   Procedure: BIOPSY;  Surgeon: Lavena Bullion, DO;  Location: [place];  Service: Gastroenterology;;   BIOPSY  08/06/2021   Procedure: BIOPSY;  Surgeon: [name], MD;  Location: [place];  Service: Gastroenterology;;   BIOPSY  09/15/2021   Procedure: BIOPSY;  Surgeon: Sharyn Creamer, MD;  Location: [place];  Service: Gastroenterology;;   BRONCHIAL BIOPSY  12/26/2021   Procedure: BRONCHIAL BIOPSIES;  Surgeon: Juanito Doom, MD;  Location: [place];  Service: Cardiopulmonary;;   BRONCHIAL WASHINGS  12/26/2021    Procedure: BRONCHIAL WASHINGS;  Surgeon: Juanito Doom, MD;  Location: [place];  Service: Cardiopulmonary;;   COLONOSCOPY  01/23/2012   Procedure: COLONOSCOPY;  Surgeon: Danie Binder, MD;  [facility];  Service: Endoscopy;  Laterality: N/A;  11:10 AM   COLONOSCOPY WITH PROPOFOL N/A 06/30/2019   Procedure: COLONOSCOPY WITH PROPOFOL;  Surgeon: Ronald Lobo, MD;  Location: [place];  Service: Endoscopy;  Laterality: N/A;   ENTEROSCOPY N/A 08/02/2019   Procedure: ENTEROSCOPY;  Surgeon: Yetta Flock, MD;  Location: [facility] ENDOSCOPY;  Service: Gastroenterology;  Laterality: N/A;   ENTEROSCOPY N/A 02/24/2021   Procedure: ENTEROSCOPY;  Surgeon: [name], MD;  Location: [facility] ENDOSCOPY;  Service: Gastroenterology;  Laterality: N/A;   ENTEROSCOPY N/A 03/26/2021   Procedure: ENTEROSCOPY;  Surgeon: Lavena Bullion, DO;  Location: [place];  Service: Gastroenterology;  Laterality: N/A;   ENTEROSCOPY N/A 07/05/2021   Procedure: ENTEROSCOPY;  Surgeon: Carol Ada, MD;  Location: [place];  Service: Gastroenterology;  Laterality: N/A;   ENTEROSCOPY N/A 08/06/2021   Procedure: ENTEROSCOPY;  Surgeon: [name], MD;  Location: [facility] ENDOSCOPY;  Service: Gastroenterology;  Laterality: N/A;   ENTEROSCOPY N/A 08/24/2021   Procedure: ENTEROSCOPY;  Surgeon: Ladene Artist, MD;  Location: [facility] ENDOSCOPY;  Service: Gastroenterology;  Laterality: N/A;   ENTEROSCOPY N/A 09/15/2021   Procedure: ENTEROSCOPY;  Surgeon: Sharyn Creamer, MD;  Location: [facility] ENDOSCOPY;  Service: Gastroenterology;  Laterality: N/A;   ENTEROSCOPY N/A 10/06/2021   Procedure: ENTEROSCOPY;  Surgeon: [name], MD;  Location: [facility] ENDOSCOPY;  Service: Gastroenterology;  Laterality: N/A;   ENTEROSCOPY N/A 11/14/2021   Procedure: ENTEROSCOPY;  Surgeon: Doran Stabler, MD;  Location: [facility] ENDOSCOPY;  Service: Gastroenterology;  Laterality: N/A;   ESOPHAGOGASTRODUODENOSCOPY N/A 08/10/2020    Procedure: ESOPHAGOGASTRODUODENOSCOPY (EGD);  Surgeon: Jerene Bears, MD;  Location: [facility] ENDOSCOPY;  Service: Gastroenterology;  Laterality: N/A;   ESOPHAGOGASTRODUODENOSCOPY (EGD) WITH PROPOFOL N/A 06/30/2019   Procedure: ESOPHAGOGASTRODUODENOSCOPY (EGD) WITH PROPOFOL;  Surgeon: Ronald Lobo, MD;  Location: [place];  Service: Endoscopy;  Laterality: N/A;   ESOPHAGOGASTRODUODENOSCOPY (EGD) WITH PROPOFOL N/A 01/12/2021   Procedure: ESOPHAGOGASTRODUODENOSCOPY (EGD) WITH PROPOFOL;  Surgeon: Sharyn Creamer, MD;  Location: [place];  Service: Gastroenterology;  Laterality: N/A;   ESOPHAGOGASTRODUODENOSCOPY (EGD) WITH PROPOFOL N/A 02/08/2021   Procedure: ESOPHAGOGASTRODUODENOSCOPY (EGD) WITH PROPOFOL;  Surgeon: Sharyn Creamer, MD;  Location: [place];  Service: Gastroenterology;  Laterality: N/A;   FISTULA SUPERFICIALIZATION Left 10/17/2019   Procedure: LEFT UPPER EXTREMITY FISTULA REVISION, SIDE BRANCH LIGATION,  AND SUPERFICIALIZATION;  Surgeon: Marty Heck, MD;  Location: [place];  Service: Vascular;  Laterality: Left;   FISTULA SUPERFICIALIZATION Left 51/88/4166   Procedure: PLICATION OF ANEURYSM LEFT ARTERIOVENOUS FISTULA;  Surgeon: Angelia Mould, MD;  Location: [place];  Service: Vascular;  Laterality: Left;   FLEXIBLE SIGMOIDOSCOPY N/A 07/05/2021   Procedure: FLEXIBLE SIGMOIDOSCOPY;  Surgeon: Carol Ada, MD;  Location: [place];  Service: Gastroenterology;  Laterality: N/A;   GIVENS CAPSULE STUDY N/A 06/30/2019   Procedure: GIVENS CAPSULE STUDY;  Surgeon: Ronald Lobo, MD;  Location: [place];  Service: Endoscopy;  Laterality: N/A;   GIVENS CAPSULE STUDY N/A 08/25/2021   Procedure: GIVENS CAPSULE STUDY;  Surgeon: Ladene Artist, MD;  Location: [place];  Service: Gastroenterology;  Laterality: N/A;   GIVENS CAPSULE STUDY N/A 10/06/2021   Procedure: GIVENS CAPSULE STUDY;  Surgeon: [name], MD;  Location: [place];  Service: Gastroenterology;   Laterality: N/A;   HEMOSTASIS CLIP PLACEMENT  08/10/2020   Procedure: HEMOSTASIS CLIP PLACEMENT;  Surgeon: Jerene Bears, MD;  Location: [place];  Service: Gastroenterology;;   HEMOSTASIS CLIP PLACEMENT  01/12/2021   Procedure: HEMOSTASIS CLIP PLACEMENT;  Surgeon: Sharyn Creamer, MD;  Location: [place];  Service: Gastroenterology;;   HEMOSTASIS CONTROL  08/02/2019   Procedure: HEMOSTASIS CONTROL;  Surgeon: Yetta Flock, MD;  Location: [place];  Service: Gastroenterology;;   HOT HEMOSTASIS  02/24/2021   Procedure: HOT HEMOSTASIS (ARGON PLASMA COAGULATION/BICAP);  Surgeon: [name], MD;  Location: [facility] ENDOSCOPY;  Service: Gastroenterology;;   HOT HEMOSTASIS N/A 03/26/2021   Procedure: HOT HEMOSTASIS (ARGON PLASMA COAGULATION/BICAP);  Surgeon: Lavena Bullion, DO;  Location: [facility] ENDOSCOPY;  Service: Gastroenterology;  Laterality: N/A;   HOT HEMOSTASIS N/A 07/05/2021   Procedure: HOT HEMOSTASIS (ARGON PLASMA COAGULATION/BICAP);  Surgeon: Carol Ada, MD;  Location: [place];  Service: Gastroenterology;  Laterality: N/A;   HOT HEMOSTASIS N/A 09/15/2021   Procedure: HOT HEMOSTASIS (ARGON PLASMA COAGULATION/BICAP);  Surgeon: Sharyn Creamer, MD;  Location: [place];  Service: Gastroenterology;  Laterality: N/A;   HOT HEMOSTASIS N/A 10/06/2021   Procedure: HOT HEMOSTASIS (ARGON PLASMA COAGULATION/BICAP);  Surgeon: [name], MD;  Location: [place];  Service: Gastroenterology;  Laterality: N/A;   INCISION AND DRAINAGE ABSCESS N/A 06/29/2016   Procedure: INCISION AND DRAINAGE ABDOMINAL WALL ABSCESS;  Surgeon: Alphonsa Overall, MD;  [facility]: [facility];  Service: General;  Laterality: N/A;   INSERTION OF DIALYSIS CATHETER Right 08/03/2019   Procedure: INSERTION OF DIALYSIS CATHETER;  Surgeon: Waynetta Sandy, MD;  Location: [place];  Service: Vascular;  Laterality: Right;   INSERTION OF DIALYSIS CATHETER Right 10/22/2019   Procedure: INSERTION OF 23CM  TUNNELED DIALYSIS CATHETER RIGHT INTERNAL JUGULAR;  Surgeon: Angelia Mould, MD;  Location: [place];  Service: Vascular;  Laterality: Right;   INSERTION OF DIALYSIS CATHETER Right 08/12/2020   Procedure: INSERTION OF Right internal Jugular TUNNELED  DIALYSIS CATHETER.;  Surgeon: Waynetta Sandy, MD;  Location: [place];  Service: Vascular;  Laterality: Right;   INSERTION OF DIALYSIS CATHETER N/A 04/19/2021   Procedure: INSERTION OF TUNNELED DIALYSIS CATHETER;  Surgeon: Angelia Mould, MD;  Location: [place];  Service: Vascular;  Laterality: N/A;   IR 3D INDEPENDENT WKST  11/07/2021   IR ANGIOGRAM SELECTIVE EACH ADDITIONAL VESSEL  11/07/2021   IR ANGIOGRAM SELECTIVE EACH ADDITIONAL VESSEL  11/07/2021   IR ANGIOGRAM SELECTIVE EACH ADDITIONAL VESSEL  11/07/2021   IR ANGIOGRAM SELECTIVE EACH ADDITIONAL VESSEL  11/28/2021   IR ANGIOGRAM VISCERAL SELECTIVE  11/07/2021   IR ANGIOGRAM VISCERAL SELECTIVE  11/28/2021   IR EMBO ARTERIAL NOT HEMORR HEMANG INC GUIDE ROADMAPPING  11/07/2021   IR EMBO TUMOR ORGAN ISCHEMIA INFARCT INC GUIDE ROADMAPPING  11/28/2021   IR RADIOLOGIST EVAL & MGMT  09/12/2021   IR US GUIDE VASC ACCESS LEFT  11/07/2021  IR US GUIDE VASC ACCESS LEFT  11/28/2021   Left heel surgery     LOWER EXTREMITY ANGIOGRAPHY N/A 07/08/2021   Procedure: Lower Extremity Angiography;  Surgeon: Cherre Robins, MD;  Location: [place] CV LAB;  Service: Cardiovascular;  Laterality: N/A;   PENILE BIOPSY N/A 03/26/2020   Procedure: PENILE ULCER DEBRIDEMENT;  Surgeon: Remi Haggard, MD;  Location[facility];  Service: Urology;  Laterality: N/A;  30 MINS   PERIPHERAL VASCULAR INTERVENTION Right 07/08/2021   Procedure: PERIPHERAL VASCULAR INTERVENTION;  Surgeon: Cherre Robins, MD;  Location: [place] CV LAB;  Service: Cardiovascular;  Laterality: Right;   SCLEROTHERAPY  01/12/2021   Procedure: SCLEROTHERAPY;  Surgeon: Sharyn Creamer, MD;  Location: [facility] ENDOSCOPY;  Service: Gastroenterology;;    Clide Deutscher  02/24/2021   Procedure: Clide Deutscher;  Surgeon: [name], MD;  Location: [place];  Service: Gastroenterology;;   SUBMUCOSAL TATTOO INJECTION  08/24/2021   Procedure: SUBMUCOSAL TATTOO INJECTION;  Surgeon: Ladene Artist, MD;  Location: [place];  Service: Gastroenterology;;   SUBMUCOSAL TATTOO INJECTION  09/15/2021   Procedure: SUBMUCOSAL TATTOO INJECTION;  Surgeon: Sharyn Creamer, MD;  Location: [place];  Service: Gastroenterology;;   VIDEO BRONCHOSCOPY N/A 12/26/2021   Procedure: VIDEO BRONCHOSCOPY WITH FLUORO;  Surgeon: Juanito Doom, MD;  Location: [place];  Service: Cardiopulmonary;  Laterality: N/A;   Family History  Family History  Problem Relation Age of Onset   Colon cancer Neg Hx    Social History  reports that he has been smoking cigarettes. He has a 22.50 pack-year smoking history. He has never used smokeless tobacco. He reports that he does not currently use alcohol. He reports that he does not currently use drugs after having used the following drugs: "Crack" cocaine. Allergies  Allergies  Allergen Reactions   Seroquel [Quetiapine] Other (See Comments)    Tardive kinesia Dystonia    Dilaudid [Hydromorphone Hcl] Itching and Other (See Comments)    Pt reports itchiness after IM injection    Home medications Prior to Admission medications   Medication Sig Start Date End Date Taking? Authorizing Provider  amLODipine (NORVASC) 5 MG tablet Take 1 tablet (5 mg total) by mouth daily. Patient not taking: Reported on 12/25/2021 09/18/21 12/26/22  [place] Eric J, DO  atorvastatin (LIPITOR) 40 MG tablet Take 1 tablet (40 mg total) by mouth daily. Patient not taking: Reported on 12/25/2021 09/18/21 12/26/22  [place] Donnamarie Poag, DO  folic acid (FOLVITE) 1 MG tablet Take 1 tablet (1 mg total) by mouth daily. Patient not taking: Reported on 12/24/2021 12/12/21   Eugenie Filler, MD  Multiple Vitamin (MULTIVITAMIN WITH MINERALS) TABS tablet  Take 1 tablet by mouth daily. Patient not taking: Reported on 12/24/2021 12/12/21   Eugenie Filler, MD  pantoprazole (PROTONIX) 40 MG tablet Take 1 tablet (40 mg total) by mouth 2 (two) times daily before a meal. Patient not taking: Reported on 12/25/2021 10/10/21 01/08/22  Mercy Riding, MD  thiamine (VITAMIN B1) 100 MG tablet Take 1 tablet (100 mg total) by mouth daily. Patient not taking: Reported on 12/25/2021 12/12/21   Eugenie Filler, MD  traZODone (DESYREL) 50 MG tablet Take 1 tablet (50 mg total) by mouth at bedtime as needed for sleep. Patient not taking: Reported on 12/24/2021 11/15/21   Nita Sells, MD  colchicine 0.6 MG tablet Take 0.5 tablets (0.3 mg total) by mouth 2 (two) times daily. 07/24/20 07/29/20  Noemi Chapel, MD  furosemide (LASIX) 40 MG  tablet Take 1 tablet (40 mg total) by mouth daily. 07/25/19 02/09/20  Ladona Horns, MD     Vitals:   01/02/22 3748 01/02/22 0645 01/02/22 0648 01/02/22 0809  BP: (!) 173/107 (!) 154/81 (!) 154/81 (!) 160/96  Pulse: 88  88 86  Resp: '11 13 14 20  '$ Temp:   98 F (36.7 C)   TempSrc:   Oral   SpO2: 98%  98% 97%   Exam Gen alert, no distress No rash, cyanosis or gangrene Sclera anicteric, throat clear  No jvd or bruits Chest bilat crackles 1/3 up the bases, no ^wob RRR no MRG Abd soft ntnd no mass or ascites +bs GU normal male MS L BKA Ext trace LE edema, no wounds or ulcers Neuro is alert, Ox 3 , nf    LUA AVF+bruit   Home meds include - amlodipine 5, atorvastatin, MVI, pantoprazole, trazodone, vits/ supps/ prns   OP HD: MWF [place] [facility] 4h  450/ 500   101kg   2/2 bath  Hep none  LUA AVF  hep B labs 8/14 - last HD 8/28 outpt, post 103.7kg, coming in 8- 12kg over x 2 wks - last Hb 8.0 on 8/21  Assessment/ Plan: SOB/ vol overload/ pulm edema - from missed HD most likely. Pt is stable and we will plan to get him up to HD as soon as possible.  ESRD - on HD MWF. Missed his last 2 HD sessions this week due to transportation  troubles. Will consult SW for any suggestions.  Hyponatremia - likely from vol overload, rx w/ HD for volume removal, and fluid restriction.  ETOH abuse - per pmd HTN/ vol - BP's up, cont home meds, get vol down w/ HD today and tomorrow H/o [facility] - f/b oncology   Rob Darian Ace  MD 01/02/2022, 8:24 AM Recent Labs  Lab 12/27/21 0726 12/27/21 0727 01/01/22 1955 01/01/22 2018 01/02/22 0655 01/02/22 0700  HGB  --    < > 8.6* 9.2* 9.3* 9.9*  ALBUMIN 2.2*  --  2.2*  --  2.4*  --   CALCIUM 8.4*  --  8.5*  --  8.5*  --   PHOS 2.7  --   --   --   --   --   CREATININE 5.26*  --  8.68* 9.30* 8.91*  --   K 3.7  --  4.4 4.6 4.0 4.3   < > = values in this interval not displayed.

## 2022-01-02 NOTE — Assessment & Plan Note (Signed)
   Continue home regimen of statin therapy 

## 2022-01-02 NOTE — Consult Note (Signed)
   [facility] CM Inpatient Consult   01/02/2022  Daniel [name] 1961/01/24 [id number]  [place] Organization [ACO] Patient: Medicare ACO REACH  Primary Care Provider:  [name], NP, [facility]   Referral:  [facility] Readmission Report  Patient is currently active with [place] [facility] for chronic disease management services.  Patient has been engaged by a [facility] NP-Geriatric.  Our community based plan of care has focused on disease management and community resource support.    Chart reviewed for last readmission as patient left [facility] and currently in observation status for missing HD on Monday and Wednesday of this week.   Plan: Notification sent to [facility] NP-G and to [facility] Readmission Report referral request that patient is currently in [facility] ED.  Of note, [facility] Care Management services does not replace or interfere with any services that are needed or arranged by inpatient [facility] care management team.  For additional questions or referrals please contact:   Natividad Brood, RN BSN [facility] Liaison  [phone] business mobile phone Toll free office [phone]  Fax number: [phone] [email or web address]

## 2022-01-02 NOTE — Assessment & Plan Note (Signed)
   Patient presenting with respiratory distress, diffuse wheezing on exam and chest x-ray evidence of pulm edema and bilateral pleural effusions  Patient's presentation is consistent with acute volume overload, likely multifactorial in the setting of missed hemodialysis and known history of diastolic congestive heart failure  Placing patient on bronchodilator therapy due to substantial wheezing  We will contact nephrology to urgently get patient hemodialysis for fluid removal  Monitoring patient on telemetry  And pending VBG  If patient clinically worsens will initiate noninvasive positive pressure ventilation

## 2022-01-02 NOTE — Procedures (Signed)
HD Note:  Some information was entered later than the data was gathered due to patient care needs. The entered time with the data is accurate.                  Patient has eaten graham crackers and peanut butter. No complaints of pain.

## 2022-01-02 NOTE — ED Notes (Signed)
Patient outside at this time. Will obtain vitals once he returns back in lobby

## 2022-01-02 NOTE — Assessment & Plan Note (Signed)
   Longstanding history of alcohol abuse  Patient reports continued alcohol use  Initiating CIWA protocol  Administering as needed benzodiazepines for evidence of withdrawal  Thiamine and folic acid supplementation  Regular counseling on cessation

## 2022-01-02 NOTE — ED Provider Notes (Signed)
[place] EMERGENCY DEPARTMENT Provider Note   CSN: [id number] Arrival date & time: 01/01/22  1909     History  Chief Complaint  Patient presents with   Shortness of Breath  Level 5 caveat due to acuity of condition  Daniel [name] is a 61 y.o. male.  The history is provided by the patient.     Patient with known history of end-stage renal disease on dialysis, diabetes, hypertension, peripheral vascular disease with left BKA, hepatocellular carcinoma presents with shortness of breath.  Patient reports he has missed his last 2 dialysis sessions.  He now reports increasing shortness of breath with cough, chest and back pain.  Patient reports he feels very uncomfortable. Past Medical History:  Diagnosis Date   Anemia    Diabetes mellitus without complication [facility])    patient denies   Dialysis patient [facility])    End stage chronic kidney disease ([place])    Hypertension    ICH (intracerebral hemorrhage) ([place]) 05/20/2017   PAD (peripheral artery disease) ([facility])    Shoulder pain, left 06/28/2013    Home Medications Prior to Admission medications   Medication Sig Start Date End Date Taking? Authorizing Provider  amLODipine (NORVASC) 5 MG tablet Take 1 tablet (5 mg total) by mouth daily. Patient not taking: Reported on 12/25/2021 09/18/21 12/26/22  [place] Eric J, DO  atorvastatin (LIPITOR) 40 MG tablet Take 1 tablet (40 mg total) by mouth daily. Patient not taking: Reported on 12/25/2021 09/18/21 12/26/22  [place] Donnamarie Poag, DO  folic acid (FOLVITE) 1 MG tablet Take 1 tablet (1 mg total) by mouth daily. Patient not taking: Reported on 12/24/2021 12/12/21   Eugenie Filler, MD  gabapentin (NEURONTIN) 300 MG capsule Take 1 capsule (300 mg total) by mouth at bedtime. Patient taking differently: Take 300 mg by mouth at bedtime as needed (for nerve pain). 11/15/21   Nita Sells, MD  Multiple Vitamin (MULTIVITAMIN WITH MINERALS) TABS tablet Take 1 tablet by mouth  daily. Patient not taking: Reported on 12/24/2021 12/12/21   Eugenie Filler, MD  pantoprazole (PROTONIX) 40 MG tablet Take 1 tablet (40 mg total) by mouth 2 (two) times daily before a meal. Patient not taking: Reported on 12/25/2021 10/10/21 01/08/22  Mercy Riding, MD  thiamine (VITAMIN B1) 100 MG tablet Take 1 tablet (100 mg total) by mouth daily. Patient not taking: Reported on 12/25/2021 12/12/21   Eugenie Filler, MD  traZODone (DESYREL) 50 MG tablet Take 1 tablet (50 mg total) by mouth at bedtime as needed for sleep. Patient not taking: Reported on 12/24/2021 11/15/21   Nita Sells, MD  colchicine 0.6 MG tablet Take 0.5 tablets (0.3 mg total) by mouth 2 (two) times daily. 07/24/20 07/29/20  Noemi Chapel, MD  furosemide (LASIX) 40 MG tablet Take 1 tablet (40 mg total) by mouth daily. 07/25/19 02/09/20  Ladona Horns, MD      Allergies    Seroquel [quetiapine] and Dilaudid [hydromorphone hcl]    Review of Systems   Review of Systems  Unable to perform ROS: Acuity of condition    Physical Exam Updated Vital Signs BP (!) 154/85   Pulse 88   Temp 97.8 F (36.6 C) (Oral)   Resp (!) 25   SpO2 98%  Physical Exam CONSTITUTIONAL: Ill-appearing HEAD: Normocephalic/atraumatic EYES: EOMI/PERRL ENMT: Mucous membranes moist NECK: supple no meningeal signs SPINE/BACK:entire spine nontender CV: S1/S2 noted LUNGS: Tachypnea, wheezing and crackles bilaterally ABDOMEN: soft, nontender GU:no cva tenderness NEURO: Pt is awake/alert/appropriate,  moves all extremitiesx4.  No facial droop.   EXTREMITIES: pulses normal/equal, left BKA noted, right lower extremity with pitting edema SKIN: warm, color normal PSYCH: Anxious  ED Results / Procedures / Treatments   Labs (all labs ordered are listed, but only abnormal results are displayed) Labs Reviewed  CBC - Abnormal; Notable for the following components:      Result Value   WBC 3.9 (*)    RBC 2.61 (*)    Hemoglobin 8.6 (*)    HCT  24.5 (*)    RDW 18.1 (*)    Platelets 96 (*)    All other components within normal limits  COMPREHENSIVE METABOLIC PANEL - Abnormal; Notable for the following components:   Sodium 120 (*)    Chloride 85 (*)    CO2 21 (*)    BUN 44 (*)    Creatinine, Ser 8.68 (*)    Calcium 8.5 (*)    Albumin 2.2 (*)    GFR, Estimated 6 (*)    All other components within normal limits  I-STAT CHEM 8, ED - Abnormal; Notable for the following components:   Sodium 120 (*)    Chloride 90 (*)    BUN 40 (*)    Creatinine, Ser 9.30 (*)    Calcium, Ion 0.99 (*)    TCO2 21 (*)    Hemoglobin 9.2 (*)    HCT 27.0 (*)    All other components within normal limits    EKG EKG Interpretation  Date/Time:  Wednesday January 01 2022 19:19:52 EDT Ventricular Rate:  93 PR Interval:  178 QRS Duration: 86 QT Interval:  374 QTC Calculation: 465 R Axis:   -37 Text Interpretation: Normal sinus rhythm Left axis deviation Inferior infarct , age undetermined Anteroseptal infarct , age undetermined Abnormal ECG Confirmed by Ripley Fraise ([id number]) on 01/02/2022 2:10:28 AM  Radiology DG Chest 2 View  Result Date: 01/01/2022 CLINICAL DATA:  Shortness of breath.  Missed dialysis. EXAM: CHEST - 2 VIEW COMPARISON:  Radiograph 12/26/2021, CT 12/25/2021 FINDINGS: Chronic cardiomegaly. Unchanged mediastinal contours with aortic atherosclerosis and tortuosity. Right greater than left pleural effusions with slight increase. Patchy airspace disease in the left mid lung persists but has decreased in density. Patchy airspace disease at the right lung base has worsened. Vascular congestion. No pneumothorax. Left axillary vascular stent. IMPRESSION: 1. Increased pleural effusions.  Vascular congestion. 2. Shifting airspace disease. Improvement in left perihilar opacities, patchy airspace disease at the right lung base has worsened. Electronically Signed   By: Keith Rake M.D.   On: 01/01/2022 20:38    Procedures .Critical  Care  Performed by: Ripley Fraise, MD Authorized by: Ripley Fraise, MD   Critical care provider statement:    Critical care time (minutes):  48   Critical care start time:  01/02/2022 2:42 AM   Critical care end time:  01/02/2022 3:30 AM   Critical care time was exclusive of:  Separately billable procedures and treating other patients   Critical care was necessary to treat or prevent imminent or life-threatening deterioration of the following conditions:  Respiratory failure and renal failure   Critical care was time spent personally by me on the following activities:  Examination of patient, evaluation of patient's response to treatment, discussions with primary provider, re-evaluation of patient's condition, pulse oximetry, ordering and review of radiographic studies, ordering and review of laboratory studies, ordering and performing treatments and interventions, review of old charts, development of treatment plan with patient or surrogate and obtaining history  from patient or surrogate   I assumed direction of critical care for this patient from another provider in my specialty: no     Care discussed with: admitting provider   Comments:     Patient with acute on chronic hyponatremia and metabolic derangements.  However no seizures, no change in mental status     Medications Ordered in ED Medications  albuterol (PROVENTIL) (2.5 MG/3ML) 0.083% nebulizer solution 5 mg (has no administration in time range)  predniSONE (DELTASONE) tablet 60 mg (has no administration in time range)  ipratropium-albuterol (DUONEB) 0.5-2.5 (3) MG/3ML nebulizer solution 3 mL (3 mLs Nebulization Given 01/02/22 0255)  cefTRIAXone (ROCEPHIN) 1 g in sodium chloride 0.9 % 100 mL IVPB (0 g Intravenous Stopped 01/02/22 0325)  doxycycline (VIBRA-TABS) tablet 100 mg (100 mg Oral Given 01/02/22 0254)    ED Course/ Medical Decision Making/ A&P Clinical Course as of 01/02/22 0426  Thu Jan 02, 2022  0234 Sodium(!):  120 Hyponatremia [DW]  0234 Creatinine(!): 9.30 Chronic renal failure [DW]  0234 Hemoglobin(!): 8.6 Chronic anemia [DW]  0238 Patient with recent hospital admission but left AGAINST MEDICAL ADVICE.  He did undergo bronchoscopy on initial results did not reveal signs of malignancy.  There was concern for pneumonia which she did not complete therapy.  We will start antibiotics [DW]  [phone] Patient appears improved after respiratory treatment.  He is receiving antibiotics.  Plan for admission [DW]  0423 Patient will have increasing work of breathing, and he did have improvement with nebs earlier, will reorder nebs and steroids.  At this point he does not require noninvasive ventilation [DW]  0423 Discussed with Dr. Cyd Silence for admission.  Patient will need dialysis, would recommend continued IV antibiotics.  Transportation issues contribute to missed dialysis which drives a lot of his ER visit [DW]    Clinical Course User Index [DW] Ripley Fraise, MD                           Medical Decision Making Amount and/or Complexity of Data Reviewed Labs:  Decision-making details documented in ED Course.  Risk Prescription drug management. Decision regarding hospitalization.   This patient presents to the ED for concern of shortness of breath, this involves an extensive number of treatment options, and is a complaint that carries with it a high risk of complications and morbidity.  The differential diagnosis includes but is not limited to Acute coronary syndrome, pneumonia, acute pulmonary edema, pneumothorax, acute anemia, pulmonary embolism    Comorbidities that complicate the patient evaluation: Patient's presentation is complicated by their history of end-stage renal disease, hepatocellular carcinoma, hypertension  Social Determinants of Health: Patient's  lack of access to car, missed dialysis sessions   increases the complexity of managing their presentation  Additional history  obtained: Records reviewed previous admission documents  Lab Tests: I Ordered, and personally interpreted labs.  The pertinent results include: End-stage renal disease, chronic anemia, hyponatremia  Imaging Studies ordered: I ordered imaging studies including X-ray chest   I independently visualized and interpreted imaging which showed vascular congestion, pleural effusion, patchy infiltrates I agree with the radiologist interpretation  Cardiac Monitoring: The patient was maintained on a cardiac monitor.  I personally viewed and interpreted the cardiac monitor which showed an underlying rhythm of:  sinus rhythm  Medicines ordered and prescription drug management: I ordered medication including albuterol and Atrovent for wheezing Reevaluation of the patient after these medicines showed that the patient  improved  Critical Interventions:  Nebulized therapies, admission for dialysis  Consultations Obtained: I requested consultation with the admitting physician [facility] Dr. Cyd Silence , and discussed  findings as well as pertinent plan - they recommend: We will admit  Reevaluation: After the interventions noted above, I reevaluated the patient and found that they have :improved  Complexity of problems addressed: Patient's presentation is most consistent with  acute presentation with potential threat to life or bodily function  Disposition: After consideration of the diagnostic results and the patient's response to treatment,  I feel that the patent would benefit from admission   .           Final Clinical Impression(s) / ED Diagnoses Final diagnoses:  ESRD on dialysis ([place])  Hyponatremia  Acute pulmonary edema ([place])  Community acquired pneumonia of right lower lobe of lung  Acute bronchospasm    Rx / DC Orders ED Discharge Orders     None         Ripley Fraise, MD 01/02/22 [phone]

## 2022-01-03 DIAGNOSIS — F1010 Alcohol abuse, uncomplicated: Secondary | ICD-10-CM | POA: Diagnosis not present

## 2022-01-03 DIAGNOSIS — E8770 Fluid overload, unspecified: Secondary | ICD-10-CM | POA: Diagnosis not present

## 2022-01-03 DIAGNOSIS — N186 End stage renal disease: Secondary | ICD-10-CM | POA: Diagnosis not present

## 2022-01-03 DIAGNOSIS — I5033 Acute on chronic diastolic (congestive) heart failure: Secondary | ICD-10-CM | POA: Diagnosis not present

## 2022-01-03 LAB — COMPREHENSIVE METABOLIC PANEL
ALT: 13 U/L (ref 0–44)
AST: 47 U/L — ABNORMAL HIGH (ref 15–41)
Albumin: 2.4 g/dL — ABNORMAL LOW (ref 3.5–5.0)
Alkaline Phosphatase: 105 U/L (ref 38–126)
Anion gap: 12 (ref 5–15)
BUN: 26 mg/dL — ABNORMAL HIGH (ref 8–23)
CO2: 26 mmol/L (ref 22–32)
Calcium: 8.5 mg/dL — ABNORMAL LOW (ref 8.9–10.3)
Chloride: 91 mmol/L — ABNORMAL LOW (ref 98–111)
Creatinine, Ser: 5.04 mg/dL — ABNORMAL HIGH (ref 0.61–1.24)
GFR, Estimated: 12 mL/min — ABNORMAL LOW (ref >60)
Glucose, Bld: 130 mg/dL — ABNORMAL HIGH (ref 70–99)
Potassium: 3.6 mmol/L (ref 3.5–5.1)
Sodium: 129 mmol/L — ABNORMAL LOW (ref 135–145)
Total Bilirubin: 0.9 mg/dL (ref 0.3–1.2)
Total Protein: 8 g/dL (ref 6.5–8.1)

## 2022-01-03 LAB — CBC WITH DIFFERENTIAL/PLATELET
Abs Immature Granulocytes: 0.02 10*3/uL (ref 0.00–0.07)
Basophils Absolute: 0 10*3/uL (ref 0.0–0.1)
Basophils Relative: 0 %
Eosinophils Absolute: 0 10*3/uL (ref 0.0–0.5)
Eosinophils Relative: 0 %
HCT: 24.1 % — ABNORMAL LOW (ref 39.0–52.0)
Hemoglobin: 8.5 g/dL — ABNORMAL LOW (ref 13.0–17.0)
Immature Granulocytes: 1 %
Lymphocytes Relative: 8 %
Lymphs Abs: 0.3 10*3/uL — ABNORMAL LOW (ref 0.7–4.0)
MCH: 32.9 pg (ref 26.0–34.0)
MCHC: 35.3 g/dL (ref 30.0–36.0)
MCV: 93.4 fL (ref 80.0–100.0)
Monocytes Absolute: 0.7 10*3/uL (ref 0.1–1.0)
Monocytes Relative: 18 %
Neutro Abs: 2.8 10*3/uL (ref 1.7–7.7)
Neutrophils Relative %: 73 %
Platelets: 90 10*3/uL — ABNORMAL LOW (ref 150–400)
RBC: 2.58 MIL/uL — ABNORMAL LOW (ref 4.22–5.81)
RDW: 18.2 % — ABNORMAL HIGH (ref 11.5–15.5)
WBC: 3.8 10*3/uL — ABNORMAL LOW (ref 4.0–10.5)
nRBC: 0 % (ref 0.0–0.2)

## 2022-01-03 LAB — MAGNESIUM: Magnesium: 1.6 mg/dL — ABNORMAL LOW (ref 1.7–2.4)

## 2022-01-03 MED ORDER — IPRATROPIUM-ALBUTEROL 0.5-2.5 (3) MG/3ML IN SOLN
3.0000 mL | RESPIRATORY_TRACT | Status: DC
  Administered 2022-01-03 – 2022-01-05 (×11): 3 mL via RESPIRATORY_TRACT
  Filled 2022-01-03 (×13): qty 3

## 2022-01-03 MED ORDER — MELATONIN 5 MG PO TABS
5.0000 mg | ORAL_TABLET | Freq: Every day | ORAL | Status: DC
  Administered 2022-01-03 – 2022-01-06 (×4): 5 mg via ORAL
  Filled 2022-01-03 (×4): qty 1

## 2022-01-03 MED ORDER — LIDOCAINE 5 % EX PTCH
1.0000 | MEDICATED_PATCH | CUTANEOUS | Status: DC
  Administered 2022-01-03 – 2022-01-07 (×4): 1 via TRANSDERMAL
  Filled 2022-01-03 (×5): qty 1

## 2022-01-03 MED ORDER — MAGNESIUM SULFATE 4 GM/100ML IV SOLN
4.0000 g | Freq: Once | INTRAVENOUS | Status: AC
  Administered 2022-01-03: 4 g via INTRAVENOUS
  Filled 2022-01-03: qty 100

## 2022-01-03 NOTE — Progress Notes (Signed)
PT Cancellation Note  Patient Details Name: Daniel [name] MRN: [id number] DOB: 1960/11/11   Cancelled Treatment:    Reason Eval/Treat Not Completed: Other (comment).  Pt declined to do anything with PT today, will retry tomorrow as pt allows.   Ramond Dial 01/03/2022, 12:08 PM  Mee Hives, PT PhD Acute Rehab Dept. Number: [place]

## 2022-01-03 NOTE — Evaluation (Signed)
Occupational Therapy Evaluation Patient Details Name: Daniel [name] MRN: [id number] DOB: 1960-05-18 Today's Date: 01/03/2022   History of Present Illness Pt is a 61 y/o male who presented to [facility] ED with shortness of breath.  PMH includes: hepatocellular carcinoma-s/p Y90 ablation on 8/3, anemia, recent GI bleeding due to AVMs, DM, ESRD on HD (MWF), HTN, ICD, PAD s/p L BKA.   Clinical Impression   Pt admitted with the above diagnoses and presents with below problem list. Pt will benefit from continued acute OT to address the below listed deficits and maximize independence with basic ADLs prior to d/c home. At baseline, pt lives alone. He reports he is mod I with squat-pivot (lateral scoot?) transfers; w/c in lieu of walking. He reports he drives. Limited eval as pt declined progress mobility beyond coming to EOB position "I'm not up for that."       Recommendations for follow up therapy are one component of a multi-disciplinary discharge planning process, led by the attending physician.  Recommendations may be updated based on patient status, additional functional criteria and insurance authorization.   Follow Up Recommendations  Home health OT    Assistance Recommended at Discharge PRN  Patient can return home with the following      Functional Status Assessment  Patient has had a recent decline in their functional status and demonstrates the ability to make significant improvements in function in a reasonable and predictable amount of time.  Equipment Recommendations  None recommended by OT    Recommendations for Other Services       Precautions / Restrictions Restrictions Weight Bearing Restrictions: Yes LLE Weight Bearing: Non weight bearing      Mobility Bed Mobility Overal bed mobility: Needs Assistance Bed Mobility: Supine to Sit     Supine to sit: Supervision     General bed mobility comments: used bed rail to power up    Transfers                    General transfer comment: pt consistently declined "I'm not up for that."      Balance Overall balance assessment: Needs assistance   Sitting balance-Leahy Scale: Fair Sitting balance - Comments: single LE support, tends to sit with 1-2 UE support for comfort.                                   ADL either performed or assessed with clinical judgement   ADL Overall ADL's : Needs assistance/impaired Eating/Feeding: Set up   Grooming: Set up;Sitting   Upper Body Bathing: Set up;Sitting   Lower Body Bathing: Min guard;Sitting/lateral leans   Upper Body Dressing : Set up;Sitting   Lower Body Dressing: Min guard;Sitting/lateral leans                 General ADL Comments: Pt reluctantly agreeable to come to EOB position with encouragement, declined further OOB activity/transfers.     Vision         Perception     Praxis      Pertinent Vitals/Pain Pain Assessment Pain Assessment: No/denies pain     Hand Dominance Right   Extremity/Trunk Assessment Upper Extremity Assessment Upper Extremity Assessment: Generalized weakness;Overall [facility] for tasks assessed   Lower Extremity Assessment Lower Extremity Assessment: Defer to PT evaluation       Communication Communication Communication: No difficulties   Cognition Arousal/Alertness: Awake/alert Behavior During Therapy: Anxious  Overall Cognitive Status: No family/caregiver present to determine baseline cognitive functioning                                 General Comments: decreased problem solving     General Comments  describes tightness "not pain" in neck in conjunction with c/o SOB. SpO2 100 on RA, HR 87.    Exercises     Shoulder Instructions      Home Living Family/patient expects to be discharged to:: Private residence Living Arrangements: Alone   Type of Home: House Home Access: Level entry     Home Layout: Two level     Bathroom Shower/Tub: Medical illustrator: Standard     Home Equipment: Wheelchair - manual   Additional Comments: states his PCP is working on getting an Tree surgeon. Shower is upstairs so he sponge bathes on main level.      Prior Functioning/Environment Prior Level of Function : Needs assist;Driving             Mobility Comments: just got his prosthetic leg but states no one has worked with him on using it. home mostly but does drive. squat-pivot to for functional transfers, stands once in a "blue moon" ADLs Comments: mod I with basic ADLs        OT Problem List: Decreased strength;Decreased activity tolerance;Impaired balance (sitting and/or standing);Decreased knowledge of use of DME or AE;Decreased knowledge of precautions      OT Treatment/Interventions: Self-care/ADL training;Therapeutic exercise;Energy conservation;DME and/or AE instruction;Therapeutic activities;Patient/family education;Balance training    OT Goals(Current goals can be found in the care plan section) Acute Rehab OT Goals Patient Stated Goal: breathe better OT Goal Formulation: With patient Time For Goal Achievement: 01/17/22 Potential to Achieve Goals: Good ADL Goals Pt Will Perform Lower Body Bathing: with modified independence;sitting/lateral leans Pt Will Perform Lower Body Dressing: with modified independence;sitting/lateral leans Pt Will Transfer to Toilet: with modified independence;squat pivot transfer Pt Will Perform Toileting - Clothing Manipulation and hygiene: with modified independence;sitting/lateral leans  OT Frequency: Min 2X/week    Co-evaluation              AM-PAC OT "6 Clicks" Daily Activity     Outcome Measure Help from another person eating meals?: None Help from another person taking care of personal grooming?: None Help from another person toileting, which includes using toliet, bedpan, or urinal?: A Little Help from another person bathing (including washing, rinsing, drying)?: A  Little Help from another person to put on and taking off regular upper body clothing?: None Help from another person to put on and taking off regular lower body clothing?: A Little 6 Click Score: 21   End of Session    Activity Tolerance: Patient tolerated treatment well Patient left: in bed;with call bell/phone within reach;Other (comment) (sitting EOB per pt request)  OT Visit Diagnosis: Other abnormalities of gait and mobility (R26.89);Muscle weakness (generalized) (M62.81);Pain                Time: 0160-1093 OT Time Calculation (min): 21 min Charges:  OT General Charges $OT Visit: 1 Visit OT Evaluation $OT Eval Low Complexity: [place], OT Acute Rehabilitation Services Office: [phone]   Hortencia Pilar 01/03/2022, 11:45 AM

## 2022-01-03 NOTE — Progress Notes (Signed)
[facility] Progress Note   Subjective:    Seen and examined patient at bedside. Tolerated yesterday's HD with net UF 4L. He reports feeling wheezy. On RA not in acute resp distress and denies CP and N/V. Scheduled for HD today.  Objective Vitals:   01/03/22 0020 01/03/22 0633 01/03/22 0744 01/03/22 1045  BP: (!) 156/79  (!) 173/76   Pulse: 89 89 93 81  Resp: '18 16 16 16  '$ Temp: (!) 97.4 F (36.3 C)  97.7 F (36.5 C)   TempSrc: Oral  Oral   SpO2: 100% 99% 99% 98%   Physical Exam General: Well-appearing; on RA; NAD Heart: S1 and S2; No murmurs, gallops, or rubs Lungs: Diminished at bases; Fine rales with expiratory wheezing mid lobes Abdomen: Soft and non-tender Extremities: L BKA; trace edema RLE Dialysis Access: L AVF (+) B/T   There were no vitals filed for this visit.  Intake/Output Summary (Last 24 hours) at 01/03/2022 1226 Last data filed at 01/03/2022 0903 Gross per 24 hour  Intake 700 ml  Output 4000 ml  Net -3300 ml    Additional Objective Labs: Basic Metabolic Panel: Recent Labs  Lab 01/01/22 1955 01/01/22 2018 01/02/22 0655 01/02/22 0700 01/03/22 0129  NA 120* 120* 119* 121* 129*  K 4.4 4.6 4.0 4.3 3.6  CL 85* 90* 83*  --  91*  CO2 21*  --  19*  --  26  GLUCOSE 91 91 137*  --  130*  BUN 44* 40* 49*  --  26*  CREATININE 8.68* 9.30* 8.91*  --  5.04*  CALCIUM 8.5*  --  8.5*  --  8.5*   Liver Function Tests: Recent Labs  Lab 01/01/22 1955 01/02/22 0655 01/03/22 0129  AST 39 42* 47*  ALT '13 13 13  '$ ALKPHOS 100 108 105  BILITOT 1.0 1.2 0.9  PROT 7.4 8.2* 8.0  ALBUMIN 2.2* 2.4* 2.4*   No results for input(s): "LIPASE", "AMYLASE" in the last 168 hours. CBC: Recent Labs  Lab 01/01/22 1955 01/01/22 2018 01/02/22 0655 01/02/22 0700 01/03/22 0129  WBC 3.9*  --  3.7*  --  3.8*  NEUTROABS  --   --  3.0  --  2.8  HGB 8.6*   < > 9.3* 9.9* 8.5*  HCT 24.5*   < > 26.2* 29.0* 24.1*  MCV 93.9  --  94.9  --  93.4  PLT 96*  --  97*  --   90*   < > = values in this interval not displayed.   Blood Culture    Component Value Date/Time   SDES BRONCHIAL ALVEOLAR LAVAGE 12/26/2021 1546   SDES BRONCHIAL ALVEOLAR LAVAGE 12/26/2021 1546   SDES BRONCHIAL ALVEOLAR LAVAGE 12/26/2021 1546   SPECREQUEST NONE 12/26/2021 1546   SPECREQUEST NONE 12/26/2021 1546   SPECREQUEST NONE 12/26/2021 1546   CULT  12/26/2021 1546    NO GROWTH 2 DAYS Performed at [facility], [place] [address], [place], [place] [address]    CULT  12/26/2021 1546    NO GROWTH 2 DAYS Performed at [facility], [place] [address], [place], [place] [address]    CULT  12/26/2021 1546    No growth aerobically or anaerobically. Performed at [facility], [place] [address], [place], [place] [address]    REPTSTATUS 12/29/2021 FINAL 12/26/2021 1546   REPTSTATUS 12/29/2021 FINAL 12/26/2021 1546   REPTSTATUS 01/01/2022 FINAL 12/26/2021 1546    Cardiac Enzymes: No results for input(s): "CKTOTAL", "CKMB", "  CKMBINDEX", "TROPONINI" in the last 168 hours. CBG: No results for input(s): "GLUCAP" in the last 168 hours. Iron Studies: No results for input(s): "IRON", "TIBC", "TRANSFERRIN", "FERRITIN" in the last 72 hours. Lab Results  Component Value Date   INR 1.1 12/10/2021   INR 1.1 12/09/2021   INR 1.0 11/28/2021   Studies/Results: DG Chest 2 View  Result Date: 01/01/2022 CLINICAL DATA:  Shortness of breath.  Missed dialysis. EXAM: CHEST - 2 VIEW COMPARISON:  Radiograph 12/26/2021, CT 12/25/2021 FINDINGS: Chronic cardiomegaly. Unchanged mediastinal contours with aortic atherosclerosis and tortuosity. Right greater than left pleural effusions with slight increase. Patchy airspace disease in the left mid lung persists but has decreased in density. Patchy airspace disease at the right lung base has worsened. Vascular congestion. No pneumothorax. Left axillary vascular stent. IMPRESSION: 1. Increased pleural effusions.  Vascular congestion. 2. Shifting airspace  disease. Improvement in left perihilar opacities, patchy airspace disease at the right lung base has worsened. Electronically Signed   By: Keith Rake M.D.   On: 01/01/2022 20:38    Medications:   amLODipine  5 mg Oral Daily   atorvastatin  40 mg Oral Daily   Chlorhexidine Gluconate Cloth  6 each Topical Q0600   Chlorhexidine Gluconate Cloth  6 each Topical E9937   folic acid  1 mg Oral Daily   gabapentin  300 mg Oral QHS   heparin  5,000 Units Subcutaneous Q8H   ipratropium-albuterol  3 mL Nebulization Q4H   lidocaine  1 patch Transdermal Q24H   multivitamin with minerals  1 tablet Oral Daily   nicotine  21 mg Transdermal Daily   pantoprazole  40 mg Oral BID AC   sodium chloride flush  3 mL Intravenous Q12H   thiamine  100 mg Oral Daily    Dialysis Orders: MWF [facility] 4h  450/ 500   101kg   2/2 bath  Hep none  LUA AVF  hep B labs 8/14 - last HD 8/28 outpt, post 103.7kg, coming in 8- 12kg over x 2 wks - last Hb 8.0 on 8/21  Assessment/Plan: SOB/ vol overload/ pulm edema - from missed HD most likely. Pt remains stable and scheduled for HD today.  ESRD - on HD MWF. Missed his last 2 HD sessions this week due to transportation troubles. Received HD yesterday and plan for HD again today. Will d/w SW on other recommendations on reliable transportation. Hyponatremia - likely from vol overload, rx w/ HD for volume removal, and fluid restriction.  ETOH abuse - per pmd HTN/ vol - BP's elevated, cont home meds, get volume down with HD. H/o [facility] - f/b oncology  Tobie Poet, NP [facility] 01/03/2022,12:26 PM  LOS: 1 day

## 2022-01-03 NOTE — Progress Notes (Addendum)
Pt known to navigator. Nephrologist note reviewed regarding pt voicing issues with transportation. Met with pt at bedside while pt receiving HD. Discussed pt's transportation issues/concerns. Pt reports that he has a car which is currently in proper working order. Pt drives self to out-pt HD at [facility] on MWF. Pt's clinic social worker completed an [employer] application for pt in the past. Contacted [facility] yesterday to see if pt has been approved for services. Pt has been approved but they had a different address than what was listed on pt's hospital demo sheet. Navigator also inquired if transportation/staff would be able to pick pt up from the back of pt's home due to pt's ramp for w/c being in the back of pt's home. Navigator was advised by safety staff that transportation can assist pts with door to door transport but staff member must be able to keep the vehicle within sight range. If a staff member looses sight of the vehicle, then staff cannot assist with door to door transport. Discussed the above info with pt. At this time, pt continues to plan to drive self but pt states that he is currently awaiting arrangement of motorized w/c. Pt states that once he gets a motorized w/c then he would be able to get to the front of his home for transport to pick him up. Pt was provided the phone number to [facility] to change address and to inquire further about services. Pt also brought up the topic of changing to a different HD clinic. Advised pt that he needs to continue going to [facility] so they can assist pt is transferring to another clinic possibly closer to pt's home. Advised by clinic in the past that when they contact other clinics about accepting pt the clinics closer to pt's home do not have availability at that time. Pt advised that he needs to attend treatments to show compliance and to allow clinic staff to investigate/assist with possible transfer. Pt voiced understanding of above information.   Melven Sartorius Renal Navigator [phone]

## 2022-01-03 NOTE — Progress Notes (Signed)
PROGRESS NOTE        PATIENT DETAILS Name: Daniel [name] Age: 61 y.o. Sex: male Date of Birth: [birth year] Admit Date: 01/01/2022 Admitting Physician Evalee Mutton Kristeen Mans, MD JZP:[id number], Milford Cage, NP  Brief Summary: Patient is a 61 y.o.  male with a significant past medical history including ESRD on dialysis (HD MWF), Hepatitis C cirrhosis, subarachnoid hemorrhage (2019), PVD with (s/p right SFA stent) and s/p left BKA, diastolic CHF, pulmonary hypertension, GERD, nicotine dependence (cigarettes) and hepatocellular carcinoma (07/2021) and recurrent GI bleeding due to AVMs, presented to the ED for shortness of breath, chest and back pain after missing 2 sessions of his routine dialysis d/t his car breaking down. Pt is found to be fluid volume overloaded, hyponatremia and further chest imaging reveals bilateral pleural effusions and significant pulmonary edema.  Nephrology consulted and following. Pt had dialysis yesterday and sodium levels up to pt baseline range of 129.    Significant events: 09/06 >> Pt presented to the ED for SOB, volume overload after missing 2 sessions of dialysis.    Significant studies: 09/06 >> CXR: Increased pleural effusions, vascular congestion. Shifting airspace disease. Improvement in left perihilar opacities, patchy airspace disease at the right lung base has worsened.   Significant lab data: 09/06 >> Sodium 121, BNP 1034.3, Anion Gap 17, BUN 49, creatinine 8.91, Plt 97 09/07 >> Sodium 129, Mg 1.6, creatinine 5.04, BUN 26, AST 47, Hgb 8.5  Procedures: HD dialysis   Consults: Nephrology  Subjective: "I didn't sleep well last night" pt stated sitting up in bed, denies any chest pain but endorses upper back pain to the left.  Objective: pt wheezing and c/o back pain and difficulty sleeping at night. Vitals: Blood pressure (!) 173/76, pulse 93, temperature 97.7 F (36.5 C), temperature source Oral, resp. rate 16, SpO2 99 %.    Exam: Gen Exam: Alert, awake, cooperative HEENT: atraumatic, neck supple Chest: B/L wheezing, SOB, dyspnea CVS: S1 S2 noted Abdomen: obese, soft non tender, distended Extremities: MAE X 4, Left BKA, RLE +1 pitting edema,  Neurology: A&O x4, Non focal Skin: warm, color normal, no rash  Pertinent Labs/Radiology:    Latest Ref Rng & Units 01/03/2022    1:29 AM 01/02/2022    7:00 AM 01/02/2022    6:55 AM  CBC  WBC 4.0 - 10.5 K/uL 3.8   3.7   Hemoglobin 13.0 - 17.0 g/dL 8.5  9.9  9.3   Hematocrit 39.0 - 52.0 % 24.1  29.0  26.2   Platelets 150 - 400 K/uL 90   97     Lab Results  Component Value Date   NA 129 (L) 01/03/2022   K 3.6 01/03/2022   CL 91 (L) 01/03/2022   CO2 26 01/03/2022     Assessment and Plan: Fluid overload: pt has past medical history of ESRD and on HD dialysis, diastolic CHF. Pt had HD dialysis yesterday, nephrology following. - HD dialysis: appreciate and defer to Nephrology  - continue bronchodilators and nebulizer treatment for wheezing - continue telemetry monitor - repeat BMP    ESRD (end stage renal disease) on dialysis [facility]): Patient typically receives hemodialysis on Mondays Wednesdays and Fridays but report he missed the last 2 hemodialysis sessions due to his car being broken down. - defer and appreciate nephrology following. - continue to monitor renal function and electrolytes  -  repeat BMP in am - continue strict fluid restriction and renal diet - continue daily weight   Bilateral pleural effusion - Please see assessment and plan above   Acute pulmonary edema  - Please see assessment and plan above   Hyponatremia: Exacerbation of patient's chronic hyponatremia due to volume overload has improved with HD, pt asymptomatic, awake/alert, Na 129, baseline seems to be 128 -130. - Appreciate nephrology following - continue monitoring sodium levels with serial chemistries   Hyperlipidemia - Continue statin therapy, atorvastatin 40 mg daily.    Hepatocellular carcinoma: Diagnosis 07/2021, Status post Y90 ablation 11/28/2021 - Outpatient follow-up with [name] - No active treatment otherwise   GERD without esophagitis - PPI therapy, continue Protonix 40 mg daily   Acute on chronic diastolic CHF (congestive heart failure): pt presented with SOB, fluid overload. Pt edema has decreased but pt still wheezing and appears out of breath. Hopeful for improvement after another HD dialysis today. - Please see assessment and plan above - continue telemetry monitoring. - reassess after dialysis    Nicotine dependence, cigarettes, uncomplicated: pt continue to smoke everyday. Patient is counseled on smoking cessation. - Continue nicotine patch - Provided with smoking cessation information.   Alcohol abuse: no signs of withdrawal.  -  continue CIWA protocol  - continue ativan prn for evidence of withdrawal - continue multivitamin, thiamine and folic acid supplementation - continue regular counseling on cessation - Education officer, museum consulted for substance abuse   Essential hypertension: BP stable  - continue oral antihypertensives, continue Norvasc and hydralazine - continue to monitor BP and adjust medications as needed  History of persistent lung infiltrates: Recent bronchoscopy with BAL/biopsies negative for malignancy and atypical infection.  PAD s/p left BKA  BMI/Obesity: Estimated body mass index is 30.01 kg/m as calculated from the following:   Height as of 12/27/21: _0  (1.803 m).   Weight as of 12/27/21: 97.6 kg.   Code status:   Code Status: Full Code   DVT Prophylaxis: heparin injection 5,000 Units Start: 01/02/22 0600    Family Communication: None at bedside   Disposition Plan: Status is: Inpatient Remains inpatient appropriate because: need HD and still volume overloaded   Planned Discharge Destination:Home   Diet: Diet Order             Diet renal with fluid restriction Fluid restriction: 1200 mL Fluid;  Room service appropriate? Yes; Fluid consistency: Thin  Diet effective now                   Antimicrobial agents: Anti-infectives (From admission, onward)    Start     Dose/Rate Route Frequency Ordered Stop   01/02/22 0245  cefTRIAXone (ROCEPHIN) 1 g in sodium chloride 0.9 % 100 mL IVPB        1 g 200 mL/hr over 30 Minutes Intravenous  Once 01/02/22 0238 01/02/22 0325   01/02/22 0245  doxycycline (VIBRA-TABS) tablet 100 mg        100 mg Oral  Once 01/02/22 0238 01/02/22 0254       MEDICATIONS: Scheduled Meds:  amLODipine  5 mg Oral Daily   atorvastatin  40 mg Oral Daily   Chlorhexidine Gluconate Cloth  6 each Topical Q0600   Chlorhexidine Gluconate Cloth  6 each Topical P8242   folic acid  1 mg Oral Daily   gabapentin  300 mg Oral QHS   heparin  5,000 Units Subcutaneous Q8H   lidocaine  1 patch Transdermal Q24H   multivitamin  with minerals  1 tablet Oral Daily   nicotine  21 mg Transdermal Daily   pantoprazole  40 mg Oral BID AC   sodium chloride flush  3 mL Intravenous Q12H   thiamine  100 mg Oral Daily   Continuous Infusions:  magnesium sulfate bolus IVPB 4 g (01/03/22 0903)   PRN Meds:.acetaminophen **OR** acetaminophen, albuterol, hydrALAZINE, HYDROmorphone (DILAUDID) injection, LORazepam **OR** LORazepam, ondansetron **OR** ondansetron (ZOFRAN) IV, polyethylene glycol, traZODone   I have personally reviewed following labs and imaging studies  LABORATORY DATA: CBC: Recent Labs  Lab 01/01/22 1955 01/01/22 2018 01/02/22 0655 01/02/22 0700 01/03/22 0129  WBC 3.9*  --  3.7*  --  3.8*  NEUTROABS  --   --  3.0  --  2.8  HGB 8.6* 9.2* 9.3* 9.9* 8.5*  HCT 24.5* 27.0* 26.2* 29.0* 24.1*  MCV 93.9  --  94.9  --  93.4  PLT 96*  --  97*  --  90*    Basic Metabolic Panel: Recent Labs  Lab 01/01/22 1955 01/01/22 2018 01/02/22 0655 01/02/22 0700 01/03/22 0129  NA 120* 120* 119* 121* 129*  K 4.4 4.6 4.0 4.3 3.6  CL 85* 90* 83*  --  91*  CO2 21*  --  19*   --  26  GLUCOSE 91 91 137*  --  130*  BUN 44* 40* 49*  --  26*  CREATININE 8.68* 9.30* 8.91*  --  5.04*  CALCIUM 8.5*  --  8.5*  --  8.5*  MG  --   --   --   --  1.6*    GFR: Estimated Creatinine Clearance: 18.1 mL/min (A) (by C-G formula based on SCr of 5.04 mg/dL (H)).  Liver Function Tests: Recent Labs  Lab 01/01/22 1955 01/02/22 0655 01/03/22 0129  AST 39 42* 47*  ALT _0 ALKPHOS 100 108 105  BILITOT 1.0 1.2 0.9  PROT 7.4 8.2* 8.0  ALBUMIN 2.2* 2.4* 2.4*   No results for input(s): "LIPASE", "AMYLASE" in the last 168 hours. No results for input(s): "AMMONIA" in the last 168 hours.  Coagulation Profile: No results for input(s): "INR", "PROTIME" in the last 168 hours.  Cardiac Enzymes: No results for input(s): "CKTOTAL", "CKMB", "CKMBINDEX", "TROPONINI" in the last 168 hours.  BNP (last 3 results) No results for input(s): "PROBNP" in the last 8760 hours.  Lipid Profile: No results for input(s): "CHOL", "HDL", "LDLCALC", "TRIG", "CHOLHDL", "LDLDIRECT" in the last 72 hours.  Thyroid Function Tests: No results for input(s): "TSH", "T4TOTAL", "FREET4", "T3FREE", "THYROIDAB" in the last 72 hours.  Anemia Panel: No results for input(s): "VITAMINB12", "FOLATE", "FERRITIN", "TIBC", "IRON", "RETICCTPCT" in the last 72 hours.  Urine analysis:    Component Value Date/Time   COLORURINE YELLOW 12/09/2021 0900   APPEARANCEUR CLEAR 12/09/2021 0900   LABSPEC 1.008 12/09/2021 0900   PHURINE 5.0 12/09/2021 0900   GLUCOSEU NEGATIVE 12/09/2021 0900   HGBUR MODERATE (A) 12/09/2021 0900   BILIRUBINUR NEGATIVE 12/09/2021 0900   KETONESUR NEGATIVE 12/09/2021 0900   PROTEINUR 100 (A) 12/09/2021 0900   UROBILINOGEN 0.2 12/31/2009 0746   NITRITE NEGATIVE 12/09/2021 0900   LEUKOCYTESUR NEGATIVE 12/09/2021 0900    Sepsis Labs: Lactic Acid, Venous    Component Value Date/Time   LATICACIDVEN 1.1 10/06/2021 0935    MICROBIOLOGY: Recent Results (from the past 240 hour(s))   Fungus Culture With Stain     Status: None (Preliminary result)   Collection Time: 12/26/21  3:46 PM   Specimen: Bronchial  Alveolar Lavage; Respiratory  Result Value Ref Range Status   Fungus Stain Final report  Final    Comment: (NOTE) Performed At: [facility] [place], [place] [id number] Rush Farmer MD AU:[id number]    Fungus (Mycology) Culture PENDING  Incomplete   Fungal Source BRONCHIAL ALVEOLAR LAVAGE  Final    Comment: Performed at [facility], [place] [address] [place], [place] [address]  Culture, BAL-quantitative w Gram Stain     Status: None   Collection Time: 12/26/21  3:46 PM   Specimen: Bronchial Alveolar Lavage; Respiratory  Result Value Ref Range Status   Specimen Description BRONCHIAL ALVEOLAR LAVAGE  Final   Special Requests NONE  Final   Gram Stain NO WBC SEEN NO ORGANISMS SEEN   Final   Culture   Final    NO GROWTH 2 DAYS Performed at [facility], [address], [place], [place] [address]    Report Status 12/29/2021 FINAL  Final  Culture, Respiratory w Gram Stain     Status: None   Collection Time: 12/26/21  3:46 PM   Specimen: Bronchial Alveolar Lavage; Respiratory  Result Value Ref Range Status   Specimen Description BRONCHIAL ALVEOLAR LAVAGE  Final   Special Requests NONE  Final   Gram Stain NO WBC SEEN NO ORGANISMS SEEN   Final   Culture   Final    NO GROWTH 2 DAYS Performed at [facility], [address], [place], [place] [address]    Report Status 12/29/2021 FINAL  Final  Aerobic/Anaerobic Culture w Gram Stain (surgical/deep wound)     Status: None   Collection Time: 12/26/21  3:46 PM   Specimen: Bronchial Alveolar Lavage; Respiratory  Result Value Ref Range Status   Specimen Description BRONCHIAL ALVEOLAR LAVAGE  Final   Special Requests NONE  Final   Gram Stain NO WBC SEEN NO ORGANISMS SEEN   Final   Culture   Final    No growth aerobically or anaerobically. Performed at [facility], [place] [address], [place], [place] [address]    Report Status 01/01/2022 FINAL  Final  Fungus Stain     Status: None   Collection Time: 12/26/21  3:46 PM   Specimen: Bronchial Alveolar Lavage; Respiratory  Result Value Ref Range Status   FUNGUS STAIN Final report  Final    Comment: (NOTE) Performed At: [facility] [address] [place], [place] [id number] Rush Farmer MD LA:[id number]    Fungal Source BRONCHIAL ALVEOLAR LAVAGE  Final    Comment: Performed at [facility], [place] [address], [place], [place] [address]  Acid Fast Smear (AFB)     Status: None   Collection Time: 12/26/21  3:46 PM   Specimen: Bronchial Alveolar Lavage; Respiratory  Result Value Ref Range Status   AFB Specimen Processing Concentration  Final   Acid Fast Smear Negative  Final    Comment: (NOTE) Performed At: [facility] [place], [place] [id number] Rush Farmer MD GQ:[id number]    Source (AFB) BRONCHIAL ALVEOLAR LAVAGE  Final    Comment: Performed at [facility], [place] [address], [place], [place] [address]  Fungal Stain reflex     Status: None   Collection Time: 12/26/21  3:46 PM  Result Value Ref Range Status   Fungal stain result 1 Comment  Final    Comment: (NOTE) KOH/Calcofluor preparation:  no fungus observed. Performed At: [place],  [place] [id number] Rush Farmer MD PY:[id number]   Fungus Culture Result     Status: None   Collection Time: 12/26/21  3:46 PM  Result Value Ref Range Status   Result 1 Comment  Final    Comment: (NOTE) KOH/Calcofluor preparation:  no fungus observed. Performed At: [facility] [place], [place] [id number] Rush Farmer MD JA:[id number]     RADIOLOGY STUDIES/RESULTS: DG Chest 2 View  Result Date: 01/01/2022 CLINICAL DATA:  Shortness of breath.  Missed dialysis. EXAM: CHEST - 2 VIEW COMPARISON:  Radiograph 12/26/2021, CT 12/25/2021 FINDINGS: Chronic  cardiomegaly. Unchanged mediastinal contours with aortic atherosclerosis and tortuosity. Right greater than left pleural effusions with slight increase. Patchy airspace disease in the left mid lung persists but has decreased in density. Patchy airspace disease at the right lung base has worsened. Vascular congestion. No pneumothorax. Left axillary vascular stent. IMPRESSION: 1. Increased pleural effusions.  Vascular congestion. 2. Shifting airspace disease. Improvement in left perihilar opacities, patchy airspace disease at the right lung base has worsened. Electronically Signed   By: Keith Rake M.D.   On: 01/01/2022 20:38     LOS: 1 day   Tanda Rockers, AGACNP Student  [facility]   01/03/2022, 9:40 AM

## 2022-01-04 ENCOUNTER — Inpatient Hospital Stay (HOSPITAL_COMMUNITY): Payer: Medicare Other

## 2022-01-04 DIAGNOSIS — R569 Unspecified convulsions: Secondary | ICD-10-CM

## 2022-01-04 DIAGNOSIS — J810 Acute pulmonary edema: Secondary | ICD-10-CM | POA: Diagnosis not present

## 2022-01-04 DIAGNOSIS — F1010 Alcohol abuse, uncomplicated: Secondary | ICD-10-CM | POA: Diagnosis not present

## 2022-01-04 DIAGNOSIS — I5033 Acute on chronic diastolic (congestive) heart failure: Secondary | ICD-10-CM | POA: Diagnosis not present

## 2022-01-04 DIAGNOSIS — E8770 Fluid overload, unspecified: Secondary | ICD-10-CM | POA: Diagnosis not present

## 2022-01-04 LAB — GLUCOSE, CAPILLARY
Glucose-Capillary: 88 mg/dL (ref 70–99)
Glucose-Capillary: 81 mg/dL (ref 70–99)
Glucose-Capillary: 142 mg/dL — ABNORMAL HIGH (ref 70–99)
Glucose-Capillary: 105 mg/dL — ABNORMAL HIGH (ref 70–99)

## 2022-01-04 LAB — RENAL FUNCTION PANEL
Albumin: 2.4 g/dL — ABNORMAL LOW (ref 3.5–5.0)
Anion gap: 14 (ref 5–15)
BUN: 21 mg/dL (ref 8–23)
CO2: 25 mmol/L (ref 22–32)
Calcium: 8.8 mg/dL — ABNORMAL LOW (ref 8.9–10.3)
Chloride: 94 mmol/L — ABNORMAL LOW (ref 98–111)
Creatinine, Ser: 4.3 mg/dL — ABNORMAL HIGH (ref 0.61–1.24)
GFR, Estimated: 15 mL/min — ABNORMAL LOW (ref >60)
Glucose, Bld: 130 mg/dL — ABNORMAL HIGH (ref 70–99)
Phosphorus: 2.5 mg/dL (ref 2.5–4.6)
Potassium: 3.9 mmol/L (ref 3.5–5.1)
Sodium: 133 mmol/L — ABNORMAL LOW (ref 135–145)

## 2022-01-04 LAB — BASIC METABOLIC PANEL
Anion gap: 10 (ref 5–15)
BUN: 17 mg/dL (ref 8–23)
CO2: 28 mmol/L (ref 22–32)
Calcium: 8.2 mg/dL — ABNORMAL LOW (ref 8.9–10.3)
Chloride: 94 mmol/L — ABNORMAL LOW (ref 98–111)
Creatinine, Ser: 3.41 mg/dL — ABNORMAL HIGH (ref 0.61–1.24)
GFR, Estimated: 20 mL/min — ABNORMAL LOW (ref >60)
Glucose, Bld: 149 mg/dL — ABNORMAL HIGH (ref 70–99)
Potassium: 3.1 mmol/L — ABNORMAL LOW (ref 3.5–5.1)
Sodium: 132 mmol/L — ABNORMAL LOW (ref 135–145)

## 2022-01-04 LAB — MRSA NEXT GEN BY PCR, NASAL: MRSA by PCR Next Gen: NOT DETECTED

## 2022-01-04 LAB — MAGNESIUM: Magnesium: 1.9 mg/dL (ref 1.7–2.4)

## 2022-01-04 MED ORDER — CALCIUM GLUCONATE-NACL 1-0.675 GM/50ML-% IV SOLN
1.0000 g | Freq: Once | INTRAVENOUS | Status: AC
Start: 2022-01-04 — End: 2022-01-04
  Administered 2022-01-04: 1000 mg via INTRAVENOUS
  Filled 2022-01-04 (×2): qty 50

## 2022-01-04 MED ORDER — FLUMAZENIL 0.5 MG/5ML IV SOLN
0.5000 mg | Freq: Once | INTRAVENOUS | Status: AC
  Administered 2022-01-04: 0.5 mg via INTRAVENOUS

## 2022-01-04 MED ORDER — ZOLPIDEM TARTRATE 5 MG PO TABS: 5.0000 mg | ORAL_TABLET | Freq: Every evening | ORAL | Status: DC | PRN

## 2022-01-04 MED ORDER — TRAZODONE HCL 50 MG PO TABS
50.0000 mg | ORAL_TABLET | Freq: Every day | ORAL | Status: DC
  Administered 2022-01-04 – 2022-01-06 (×3): 50 mg via ORAL
  Filled 2022-01-04 (×3): qty 1

## 2022-01-04 MED ORDER — DIPHENHYDRAMINE HCL 25 MG PO CAPS
25.0000 mg | ORAL_CAPSULE | Freq: Four times a day (QID) | ORAL | Status: DC | PRN
  Administered 2022-01-04: 25 mg via ORAL
  Filled 2022-01-04: qty 1

## 2022-01-04 MED ORDER — LIDOCAINE HCL (PF) 1 % IJ SOLN
5.0000 mL | INTRAMUSCULAR | Status: DC | PRN
Start: 2022-01-04 — End: 2022-01-04

## 2022-01-04 MED ORDER — PENTAFLUOROPROP-TETRAFLUOROETH EX AERO
1.0000 | INHALATION_SPRAY | CUTANEOUS | Status: DC | PRN
Start: 2022-01-04 — End: 2022-01-04

## 2022-01-04 MED ORDER — LORAZEPAM 2 MG/ML IJ SOLN
2.0000 mg | INTRAMUSCULAR | Status: DC | PRN
Start: 2022-01-04 — End: 2022-01-07
  Filled 2022-01-04: qty 1

## 2022-01-04 MED ORDER — ANTICOAGULANT SODIUM CITRATE 4% (200MG/5ML) IV SOLN
5.0000 mL | Status: DC | PRN
Start: 2022-01-04 — End: 2022-01-04

## 2022-01-04 MED ORDER — CHLORHEXIDINE GLUCONATE CLOTH 2 % EX PADS
6.0000 | MEDICATED_PAD | Freq: Every day | CUTANEOUS | Status: DC
  Administered 2022-01-04 – 2022-01-07 (×4): 6 via TOPICAL

## 2022-01-04 MED ORDER — LIDOCAINE-PRILOCAINE 2.5-2.5 % EX CREA: 1.0000 | TOPICAL_CREAM | CUTANEOUS | Status: DC | PRN

## 2022-01-04 MED ORDER — POTASSIUM CHLORIDE CRYS ER 20 MEQ PO TBCR
20.0000 meq | EXTENDED_RELEASE_TABLET | Freq: Once | ORAL | Status: AC
  Administered 2022-01-04: 20 meq via ORAL
  Filled 2022-01-04: qty 1

## 2022-01-04 MED ORDER — SODIUM CHLORIDE 0.9 % IV SOLN
1.0000 mg | Freq: Once | INTRAVENOUS | Status: AC
  Administered 2022-01-04: 1 mg via INTRAVENOUS
  Filled 2022-01-04: qty 0.2

## 2022-01-04 MED ORDER — ALBUTEROL SULFATE (2.5 MG/3ML) 0.083% IN NEBU
2.5000 mg | INHALATION_SOLUTION | Freq: Once | RESPIRATORY_TRACT | Status: AC
  Administered 2022-01-04: 2.5 mg via RESPIRATORY_TRACT

## 2022-01-04 MED ORDER — HEPARIN SODIUM (PORCINE) 1000 UNIT/ML DIALYSIS
1000.0000 [IU] | INTRAMUSCULAR | Status: DC | PRN
Start: 2022-01-04 — End: 2022-01-04

## 2022-01-04 MED ORDER — LEVETIRACETAM IN NACL 1000 MG/100ML IV SOLN
1000.0000 mg | Freq: Once | INTRAVENOUS | Status: AC
  Administered 2022-01-04: 1000 mg via INTRAVENOUS
  Filled 2022-01-04: qty 100

## 2022-01-04 MED ORDER — THIAMINE HCL 100 MG/ML IJ SOLN
100.0000 mg | Freq: Every day | INTRAMUSCULAR | Status: DC
Start: 2022-01-04 — End: 2022-01-07
  Administered 2022-01-04 – 2022-01-07 (×3): 100 mg via INTRAVENOUS
  Filled 2022-01-04 (×3): qty 2

## 2022-01-04 MED ORDER — ALTEPLASE 2 MG IJ SOLR: 2.0000 mg | Freq: Once | INTRAMUSCULAR | Status: DC | PRN

## 2022-01-04 MED ORDER — ORAL CARE MOUTH RINSE: 15.0000 mL | OROMUCOSAL | Status: DC | PRN

## 2022-01-04 NOTE — Consult Note (Addendum)
NAME:  Daniel [name], MRN:  [id number], DOB:  04-Feb-1961, LOS: 2 ADMISSION DATE:  01/01/2022, CONSULTATION DATE:  9/9 REFERRING MD:  Dr. Arlice Colt, CHIEF COMPLAINT:  AMS   History of Present Illness:  61 y/o M who presented to [facility] on 9/6 with reports of shortness of breath.    At baseline, he lives lone and drives himself to HD.  He is wheelchair dependent. He has previously reported issues with transportation as they will not use the ramp on his back door and the steps in front are "too high" for a ramp.    He was recently admitted from 8/30-9/1 for SOB in the setting of multifocal infiltrates.  Initially thought due to volume overload +/- multifocal PNA.  He underwent an FOB per Dr. Lake Bells with pathology negative for malignancy / granulomatous disease.  All cultures negative.  His symptoms resolved with HD.  He left AMA.   He returns with recurrent SOB.  He missed at least 2 HD appointments due to transportation issues. The patient was found to have bilateral pleural effusions in the setting of volume overload. He was admitted per [facility] 9/7 for further care.  The patient completed his first HD on 9/7.  On 9/9, he was in HD and suffered what appeared to be a tonic clonic seizure lasting 1-2 minutes that was witnessed by the Nephrology NP.  He was actually given 0.'5mg'$  of ativan before the seizure for possible withdrawal like symptoms.  Then seizure followed.  He had a brief period of post-ictal state and then woke and was following commands.  He had audible wheezing which improved with BiPAP.  His ativan was reversed. His mental status improved, decision was to attempt HD given his volume removal needs.  Unfortunately, he suffered an an additional seizure and was transferred to ICU.   AM labs 9/9 - Na 132, K 3.1, CL 94, CO2 28, glucose 149, BUN 17, Cr 3.41, Mg+ 1.9, WBC 3.8, Hgb 8.5, platelets 90.  PCXR evaluated in HD which shows bilateral pulmonary infiltrates / small right pleural effusion -  unchanged.   Pertinent  Medical History  Hepatitis C+ Cirrhosis  Hepatocellular Carcinoma - Dx 07/2021, followed by [name] s/p Y90 ablation 11/2021 IDA  GERD  Duodenal AVM's  ESRD on HD MWF  Hx [place] 2019 PVD s/p R SFA Stent, L BKA Diastolic CHF  Pulmonary HTN  Tobacco Abuse   Significant Hospital Events: Including procedures, antibiotic start and stop dates in addition to other pertinent events   9/6 Admit with SOB after missing HD 9/9 Seizure x2, tx to ICU   Interim History / Subjective:  Afebrile  As above   Objective   Blood pressure (!) 155/70, pulse 96, temperature 97.8 F (36.6 C), temperature source Oral, resp. rate 20, weight 102.6 kg, SpO2 96 %.        Intake/Output Summary (Last 24 hours) at 01/04/2022 0901 Last data filed at 01/04/2022 0813 Gross per 24 hour  Intake 400 ml  Output 203.5 ml  Net 196.5 ml   Filed Weights   01/03/22 1445 01/03/22 1849  Weight: 106.3 kg 102.6 kg    Examination: General: chronically ill appearing adult male lying in bed in NAD HENT: MM pink/moist, anicteric, pupils =/reactive  Lungs: mild abdominal accessory use, upper airway wheezing on arrival that resolved with positive pressure, faint wheezing bilaterally  Cardiovascular: s1s2 RRR, no m/r/g Abdomen: obese/soft, bsx4 active  Extremities: warm/dry, no significant peripheral edema Neuro: drowsy, awakens to voice,  follows commands / MAE    Resolved Hospital Problem list      Assessment & Plan:   Acute Metabolic Encephalopathy  Seizure  ETOH Abuse  New onset seizure this admission.  Question withdrawal given timing (day 3), especially if he didn't have access to buy more ETOH given transportation issues.  Less likely Na+ given rate of correction.  -transfer to ICU  -seizure precautions  -Neurology evaluation appreciated  -AED's per Neurology  -thiamine, folate, MVI  -CT head  -EEG  -CIWA protocol   Acute Hypoxic Respiratory Failure in setting of Chronic  Diastolic CHF, Volume Overload with Pleural Effusions  Pulmonary Infiltrates  Tobacco Abuse  Recent FOB for non-resolving infiltrates with negative malignancy, cultures and no granulomas.  -continue duoneb Q4  -PRN albuterol  -follow intermittent CXR  -BiPAP PRN for increased WOB  -wean O2 for sats 90-95% -query underlying sleep disordered breathing given his body habitus   GERD, Hx AVM's  -PPI   ESRD  On HD MWF, missed at least 2 HD sessions prior to admission  -appreciate Nephrology  -Trend BMP / urinary output -Replace electrolytes as indicated  Hypokalemia, Hypomagnesemia, Hypocalcemia, Hyponatremia  Chronic hyponatremia  -monitor electrolytes, replace as indicated - calcium 9/9   Hx HTN, HLD -continue norvasc, lipitor, PRN hydralazine   Hepatocellular Carcinoma Hepatitis C+ Followed by [name]  -outpatient follow up   Best Practice (right click and "Reselect all SmartList Selections" daily)  Diet/type: NPO DVT prophylaxis: prophylactic heparin  GI prophylaxis: PPI Lines: N/A Foley:  N/A Code Status:  full code Last date of multidisciplinary goals of care discussion: pending   Labs   CBC: Recent Labs  Lab 01/01/22 1955 01/01/22 2018 01/02/22 0655 01/02/22 0700 01/03/22 0129  WBC 3.9*  --  3.7*  --  3.8*  NEUTROABS  --   --  3.0  --  2.8  HGB 8.6* 9.2* 9.3* 9.9* 8.5*  HCT 24.5* 27.0* 26.2* 29.0* 24.1*  MCV 93.9  --  94.9  --  93.4  PLT 96*  --  97*  --  90*    Basic Metabolic Panel: Recent Labs  Lab 01/01/22 1955 01/01/22 2018 01/02/22 0655 01/02/22 0700 01/03/22 0129 01/04/22 0027  NA 120* 120* 119* 121* 129* 132*  K 4.4 4.6 4.0 4.3 3.6 3.1*  CL 85* 90* 83*  --  91* 94*  CO2 21*  --  19*  --  26 28  GLUCOSE 91 91 137*  --  130* 149*  BUN 44* 40* 49*  --  26* 17  CREATININE 8.68* 9.30* 8.91*  --  5.04* 3.41*  CALCIUM 8.5*  --  8.5*  --  8.5* 8.2*  MG  --   --   --   --  1.6* 1.9   GFR: Estimated Creatinine Clearance: 27.4 mL/min (A)  (by C-G formula based on SCr of 3.41 mg/dL (H)). Recent Labs  Lab 01/01/22 1955 01/02/22 0655 01/03/22 0129  WBC 3.9* 3.7* 3.8*    Liver Function Tests: Recent Labs  Lab 01/01/22 1955 01/02/22 0655 01/03/22 0129  AST 39 42* 47*  ALT '13 13 13  '$ ALKPHOS 100 108 105  BILITOT 1.0 1.2 0.9  PROT 7.4 8.2* 8.0  ALBUMIN 2.2* 2.4* 2.4*   No results for input(s): "LIPASE", "AMYLASE" in the last 168 hours. No results for input(s): "AMMONIA" in the last 168 hours.  ABG    Component Value Date/Time   PHART 7.358 05/20/2017 2157   PCO2ART 38.9 05/20/2017 2157  PO2ART 74.0 (L) 05/20/2017 2157   HCO3 31.3 (H) 01/02/2022 1833   TCO2 20 (L) 01/02/2022 0700   ACIDBASEDEF 4.0 (H) 01/02/2022 0700   O2SAT 86.2 01/02/2022 1833     Coagulation Profile: No results for input(s): "INR", "PROTIME" in the last 168 hours.  Cardiac Enzymes: No results for input(s): "CKTOTAL", "CKMB", "CKMBINDEX", "TROPONINI" in the last 168 hours.  HbA1C: Hemoglobin A1C  Date/Time Value Ref Range Status  09/03/2021 09:31 AM 5.2 4.0 - 5.6 % Final  09/26/2020 09:30 AM 5.4 4.0 - 5.6 % Final   Hgb A1c MFr Bld  Date/Time Value Ref Range Status  03/26/2020 10:22 AM 6.4 (H) 4.8 - 5.6 % Final    Comment:    (NOTE) Pre diabetes:          5.7%-6.4%  Diabetes:              >6.4%  Glycemic control for   <7.0% adults with diabetes   05/21/2017 06:32 AM 6.7 (H) 4.8 - 5.6 % Final    Comment:    (NOTE) Pre diabetes:          5.7%-6.4% Diabetes:              >6.4% Glycemic control for   <7.0% adults with diabetes     CBG: No results for input(s): "GLUCAP" in the last 168 hours.  Review of Systems:   Unable to complete as patient is altered, no family at bedside. Information obtained from staff at bedside, prior medical documentation.   Past Medical History:  He,  has a past medical history of Anemia, Diabetes mellitus without complication ([place]), Dialysis patient [facility]), End stage chronic kidney disease  ([place]), Hypertension, ICH (intracerebral hemorrhage) ([place]) (05/20/2017), PAD (peripheral artery disease) ([place]), and Shoulder pain, left (06/28/2013).   Surgical History:   Past Surgical History:  Procedure Laterality Date   A/V FISTULAGRAM N/A 08/15/2020   Procedure: A/V FISTULAGRAM - Left Upper;  Surgeon: Cherre Robins, MD;  Location: [place] [facility];  Service: Cardiovascular;  Laterality: N/A;   ABDOMINAL AORTOGRAM W/LOWER EXTREMITY N/A 04/08/2021   Procedure: ABDOMINAL AORTOGRAM W/LOWER EXTREMITY;  Surgeon: Waynetta Sandy, MD;  Location: [place] CV LAB;  Service: Cardiovascular;  Laterality: N/A;   AMPUTATION Left 04/16/2021   Procedure: LEFT BELOW KNEE AMPUTATION;  Surgeon: Angelia Mould, MD;  Location: [place];  Service: Vascular;  Laterality: Left;   AMPUTATION Left 06/17/2021   Procedure: REVISION AMPUTATION BELOW KNEE;  Surgeon: Broadus John, MD;  Location: [place];  Service: Vascular;  Laterality: Left;   APPENDECTOMY     APPLICATION OF WOUND VAC Left 06/17/2021   Procedure: APPLICATION OF WOUND VAC;  Surgeon: Broadus John, MD;  Location: [place];  Service: Vascular;  Laterality: Left;   AV FISTULA PLACEMENT Left 08/03/2019   Procedure: LEFT ARM ARTERIOVENOUS (AV) CEPHALIC  FISTULA CREATION;  Surgeon: Waynetta Sandy, MD;  Location: [place];  Service: Vascular;  Laterality: Left;   BIOPSY  06/30/2019   Procedure: BIOPSY;  Surgeon: Ronald Lobo, MD;  Location: [facility] ENDOSCOPY;  Service: Endoscopy;;   BIOPSY  08/02/2019   Procedure: BIOPSY;  Surgeon: Yetta Flock, MD;  Location: [facility] ENDOSCOPY;  Service: Gastroenterology;;   BIOPSY  02/08/2021   Procedure: BIOPSY;  Surgeon: Sharyn Creamer, MD;  Location: [facility] ENDOSCOPY;  Service: Gastroenterology;;   BIOPSY  02/24/2021   Procedure: BIOPSY;  Surgeon: [name], MD;  Location: [place];  Service: Gastroenterology;;  BIOPSY  03/26/2021   Procedure: BIOPSY;  Surgeon: Lavena Bullion,  DO;  Location: [place];  Service: Gastroenterology;;   BIOPSY  08/06/2021   Procedure: BIOPSY;  Surgeon: [name], MD;  Location: [place];  Service: Gastroenterology;;   BIOPSY  09/15/2021   Procedure: BIOPSY;  Surgeon: Sharyn Creamer, MD;  Location: [place];  Service: Gastroenterology;;   BRONCHIAL BIOPSY  12/26/2021   Procedure: BRONCHIAL BIOPSIES;  Surgeon: Juanito Doom, MD;  Location: [place];  Service: Cardiopulmonary;;   BRONCHIAL WASHINGS  12/26/2021   Procedure: BRONCHIAL WASHINGS;  Surgeon: Juanito Doom, MD;  Location: [place];  Service: Cardiopulmonary;;   COLONOSCOPY  01/23/2012   Procedure: COLONOSCOPY;  Surgeon: Danie Binder, MD;  [facility];  Service: Endoscopy;  Laterality: N/A;  11:10 AM   COLONOSCOPY WITH PROPOFOL N/A 06/30/2019   Procedure: COLONOSCOPY WITH PROPOFOL;  Surgeon: Ronald Lobo, MD;  Location: [place];  Service: Endoscopy;  Laterality: N/A;   ENTEROSCOPY N/A 08/02/2019   Procedure: ENTEROSCOPY;  Surgeon: Yetta Flock, MD;  Location: [facility] ENDOSCOPY;  Service: Gastroenterology;  Laterality: N/A;   ENTEROSCOPY N/A 02/24/2021   Procedure: ENTEROSCOPY;  Surgeon: [name], MD;  Location: [facility] ENDOSCOPY;  Service: Gastroenterology;  Laterality: N/A;   ENTEROSCOPY N/A 03/26/2021   Procedure: ENTEROSCOPY;  Surgeon: Lavena Bullion, DO;  Location: [place];  Service: Gastroenterology;  Laterality: N/A;   ENTEROSCOPY N/A 07/05/2021   Procedure: ENTEROSCOPY;  Surgeon: Carol Ada, MD;  Location: [place];  Service: Gastroenterology;  Laterality: N/A;   ENTEROSCOPY N/A 08/06/2021   Procedure: ENTEROSCOPY;  Surgeon: [name], MD;  Location: [facility] ENDOSCOPY;  Service: Gastroenterology;  Laterality: N/A;   ENTEROSCOPY N/A 08/24/2021   Procedure: ENTEROSCOPY;  Surgeon: Ladene Artist, MD;  Location: [facility] ENDOSCOPY;  Service: Gastroenterology;  Laterality: N/A;   ENTEROSCOPY N/A  09/15/2021   Procedure: ENTEROSCOPY;  Surgeon: Sharyn Creamer, MD;  Location: [facility] ENDOSCOPY;  Service: Gastroenterology;  Laterality: N/A;   ENTEROSCOPY N/A 10/06/2021   Procedure: ENTEROSCOPY;  Surgeon: [name], MD;  Location: [facility] ENDOSCOPY;  Service: Gastroenterology;  Laterality: N/A;   ENTEROSCOPY N/A 11/14/2021   Procedure: ENTEROSCOPY;  Surgeon: Doran Stabler, MD;  Location: [facility] ENDOSCOPY;  Service: Gastroenterology;  Laterality: N/A;   ESOPHAGOGASTRODUODENOSCOPY N/A 08/10/2020   Procedure: ESOPHAGOGASTRODUODENOSCOPY (EGD);  Surgeon: Jerene Bears, MD;  Location: [facility] ENDOSCOPY;  Service: Gastroenterology;  Laterality: N/A;   ESOPHAGOGASTRODUODENOSCOPY (EGD) WITH PROPOFOL N/A 06/30/2019   Procedure: ESOPHAGOGASTRODUODENOSCOPY (EGD) WITH PROPOFOL;  Surgeon: Ronald Lobo, MD;  Location: [place];  Service: Endoscopy;  Laterality: N/A;   ESOPHAGOGASTRODUODENOSCOPY (EGD) WITH PROPOFOL N/A 01/12/2021   Procedure: ESOPHAGOGASTRODUODENOSCOPY (EGD) WITH PROPOFOL;  Surgeon: Sharyn Creamer, MD;  Location: [place];  Service: Gastroenterology;  Laterality: N/A;   ESOPHAGOGASTRODUODENOSCOPY (EGD) WITH PROPOFOL N/A 02/08/2021   Procedure: ESOPHAGOGASTRODUODENOSCOPY (EGD) WITH PROPOFOL;  Surgeon: Sharyn Creamer, MD;  Location: [place];  Service: Gastroenterology;  Laterality: N/A;   FISTULA SUPERFICIALIZATION Left 10/17/2019   Procedure: LEFT UPPER EXTREMITY FISTULA REVISION, SIDE BRANCH LIGATION,  AND SUPERFICIALIZATION;  Surgeon: Marty Heck, MD;  Location: [place];  Service: Vascular;  Laterality: Left;   FISTULA SUPERFICIALIZATION Left 60/63/0160   Procedure: PLICATION OF ANEURYSM LEFT ARTERIOVENOUS FISTULA;  Surgeon: Angelia Mould, MD;  Location: [place];  Service: Vascular;  Laterality: Left;   FLEXIBLE SIGMOIDOSCOPY N/A 07/05/2021   Procedure: FLEXIBLE SIGMOIDOSCOPY;  Surgeon: Carol Ada, MD;  Location: [facility]  ENDOSCOPY;  Service: Gastroenterology;  Laterality: N/A;    GIVENS CAPSULE STUDY N/A 06/30/2019   Procedure: GIVENS CAPSULE STUDY;  Surgeon: Ronald Lobo, MD;  Location: [place];  Service: Endoscopy;  Laterality: N/A;   GIVENS CAPSULE STUDY N/A 08/25/2021   Procedure: GIVENS CAPSULE STUDY;  Surgeon: Ladene Artist, MD;  Location: [place];  Service: Gastroenterology;  Laterality: N/A;   GIVENS CAPSULE STUDY N/A 10/06/2021   Procedure: GIVENS CAPSULE STUDY;  Surgeon: [name], MD;  Location: [place];  Service: Gastroenterology;  Laterality: N/A;   HEMOSTASIS CLIP PLACEMENT  08/10/2020   Procedure: HEMOSTASIS CLIP PLACEMENT;  Surgeon: Jerene Bears, MD;  Location: [place];  Service: Gastroenterology;;   HEMOSTASIS CLIP PLACEMENT  01/12/2021   Procedure: HEMOSTASIS CLIP PLACEMENT;  Surgeon: Sharyn Creamer, MD;  Location: [place];  Service: Gastroenterology;;   HEMOSTASIS CONTROL  08/02/2019   Procedure: HEMOSTASIS CONTROL;  Surgeon: Yetta Flock, MD;  Location: [place];  Service: Gastroenterology;;   HOT HEMOSTASIS  02/24/2021   Procedure: HOT HEMOSTASIS (ARGON PLASMA COAGULATION/BICAP);  Surgeon: [name], MD;  Location: [facility] ENDOSCOPY;  Service: Gastroenterology;;   HOT HEMOSTASIS N/A 03/26/2021   Procedure: HOT HEMOSTASIS (ARGON PLASMA COAGULATION/BICAP);  Surgeon: Lavena Bullion, DO;  Location: [facility] ENDOSCOPY;  Service: Gastroenterology;  Laterality: N/A;   HOT HEMOSTASIS N/A 07/05/2021   Procedure: HOT HEMOSTASIS (ARGON PLASMA COAGULATION/BICAP);  Surgeon: Carol Ada, MD;  Location: [place];  Service: Gastroenterology;  Laterality: N/A;   HOT HEMOSTASIS N/A 09/15/2021   Procedure: HOT HEMOSTASIS (ARGON PLASMA COAGULATION/BICAP);  Surgeon: Sharyn Creamer, MD;  Location: [place];  Service: Gastroenterology;  Laterality: N/A;   HOT HEMOSTASIS N/A 10/06/2021   Procedure: HOT HEMOSTASIS (ARGON PLASMA COAGULATION/BICAP);  Surgeon: [name], MD;  Location: [place];  Service:  Gastroenterology;  Laterality: N/A;   INCISION AND DRAINAGE ABSCESS N/A 06/29/2016   Procedure: INCISION AND DRAINAGE ABDOMINAL WALL ABSCESS;  Surgeon: Alphonsa Overall, MD;  Location: [facility];  Service: General;  Laterality: N/A;   INSERTION OF DIALYSIS CATHETER Right 08/03/2019   Procedure: INSERTION OF DIALYSIS CATHETER;  Surgeon: Waynetta Sandy, MD;  Location: [place];  Service: Vascular;  Laterality: Right;   INSERTION OF DIALYSIS CATHETER Right 10/22/2019   Procedure: INSERTION OF 23CM TUNNELED DIALYSIS CATHETER RIGHT INTERNAL JUGULAR;  Surgeon: Angelia Mould, MD;  Location: [place];  Service: Vascular;  Laterality: Right;   INSERTION OF DIALYSIS CATHETER Right 08/12/2020   Procedure: INSERTION OF Right internal Jugular TUNNELED  DIALYSIS CATHETER.;  Surgeon: Waynetta Sandy, MD;  Location: [place];  Service: Vascular;  Laterality: Right;   INSERTION OF DIALYSIS CATHETER N/A 04/19/2021   Procedure: INSERTION OF TUNNELED DIALYSIS CATHETER;  Surgeon: Angelia Mould, MD;  Location: [place];  Service: Vascular;  Laterality: N/A;   IR 3D INDEPENDENT WKST  11/07/2021   IR ANGIOGRAM SELECTIVE EACH ADDITIONAL VESSEL  11/07/2021   IR ANGIOGRAM SELECTIVE EACH ADDITIONAL VESSEL  11/07/2021   IR ANGIOGRAM SELECTIVE EACH ADDITIONAL VESSEL  11/07/2021   IR ANGIOGRAM SELECTIVE EACH ADDITIONAL VESSEL  11/28/2021   IR ANGIOGRAM VISCERAL SELECTIVE  11/07/2021   IR ANGIOGRAM VISCERAL SELECTIVE  11/28/2021   IR EMBO ARTERIAL NOT HEMORR HEMANG INC GUIDE ROADMAPPING  11/07/2021   IR EMBO TUMOR ORGAN ISCHEMIA INFARCT INC GUIDE ROADMAPPING  11/28/2021   IR RADIOLOGIST EVAL & MGMT  09/12/2021   IR US GUIDE VASC ACCESS LEFT  11/07/2021   IR US GUIDE VASC ACCESS  LEFT  11/28/2021   Left heel surgery     LOWER EXTREMITY ANGIOGRAPHY N/A 07/08/2021   Procedure: Lower Extremity Angiography;  Surgeon: Cherre Robins, MD;  Location: [place] [facility];  Service: Cardiovascular;  Laterality: N/A;   PENILE BIOPSY  N/A 03/26/2020   Procedure: PENILE ULCER DEBRIDEMENT;  Surgeon: Remi Haggard, MD;  Location: [facility];  Service: Urology;  Laterality: N/A;  30 MINS   PERIPHERAL VASCULAR INTERVENTION Right 07/08/2021   Procedure: PERIPHERAL VASCULAR INTERVENTION;  Surgeon: Cherre Robins, MD;  Location: [place] [facility];  Service: Cardiovascular;  Laterality: Right;   SCLEROTHERAPY  01/12/2021   Procedure: SCLEROTHERAPY;  Surgeon: Sharyn Creamer, MD;  Location: [facility] ENDOSCOPY;  Service: Gastroenterology;;   Clide Deutscher  02/24/2021   Procedure: Clide Deutscher;  Surgeon: [name], MD;  Location: [place];  Service: Gastroenterology;;   SUBMUCOSAL TATTOO INJECTION  08/24/2021   Procedure: SUBMUCOSAL TATTOO INJECTION;  Surgeon: Ladene Artist, MD;  Location: [place];  Service: Gastroenterology;;   SUBMUCOSAL TATTOO INJECTION  09/15/2021   Procedure: SUBMUCOSAL TATTOO INJECTION;  Surgeon: Sharyn Creamer, MD;  Location: [place];  Service: Gastroenterology;;   VIDEO BRONCHOSCOPY N/A 12/26/2021   Procedure: VIDEO BRONCHOSCOPY WITH FLUORO;  Surgeon: Juanito Doom, MD;  Location: [place];  Service: Cardiopulmonary;  Laterality: N/A;     Social History:   reports that he has been smoking cigarettes. He has a 22.50 pack-year smoking history. He has never used smokeless tobacco. He reports that he does not currently use alcohol. He reports that he does not currently use drugs after having used the following drugs: "Crack" cocaine.   Family History:  His family history is negative for Colon cancer.   Allergies Allergies  Allergen Reactions   Seroquel [Quetiapine] Other (See Comments)    Tardive kinesia Dystonia    Dilaudid [Hydromorphone Hcl] Itching and Other (See Comments)    Pt reports itchiness after IM injection      Home Medications  Prior to Admission medications   Medication Sig Start Date End Date Taking? Authorizing Provider  amLODipine (NORVASC) 5 MG tablet Take  1 tablet (5 mg total) by mouth daily. 09/18/21 12/26/22 Yes [place] Eric J, DO  atorvastatin (LIPITOR) 40 MG tablet Take 1 tablet (40 mg total) by mouth daily. 09/18/21 12/26/22 Yes [place] Donnamarie Poag, DO  folic acid (FOLVITE) 1 MG tablet Take 1 tablet (1 mg total) by mouth daily. 12/12/21  Yes Eugenie Filler, MD  Multiple Vitamin (MULTIVITAMIN WITH MINERALS) TABS tablet Take 1 tablet by mouth daily. 12/12/21  Yes Eugenie Filler, MD  pantoprazole (PROTONIX) 40 MG tablet Take 1 tablet (40 mg total) by mouth 2 (two) times daily before a meal. 10/10/21 01/08/22 Yes Mercy Riding, MD  thiamine (VITAMIN B1) 100 MG tablet Take 1 tablet (100 mg total) by mouth daily. 12/12/21  Yes Eugenie Filler, MD  traZODone (DESYREL) 50 MG tablet Take 1 tablet (50 mg total) by mouth at bedtime as needed for sleep. Patient taking differently: Take 50 mg by mouth daily as needed for sleep. 11/15/21  Yes Nita Sells, MD  colchicine 0.6 MG tablet Take 0.5 tablets (0.3 mg total) by mouth 2 (two) times daily. 07/24/20 07/29/20  Noemi Chapel, MD  furosemide (LASIX) 40 MG tablet Take 1 tablet (40 mg total) by mouth daily. 07/25/19 02/09/20  Ladona Horns, MD     Critical care time: 81 minutes     Noe Gens, MSN, APRN, NP-C,  AGACNP-BC [facility] 01/04/2022, 10:17 AM   Please see [email or web address] for pager details.   From 7A-7P if no response, please call [phone] After hours, please call ELink [phone]

## 2022-01-04 NOTE — Progress Notes (Signed)
RT called for rapid response in HD. RT at bedside to find pt on 5L  with increased work of breathing. RN pulled breathing treatment medication, RT gave breathing treatment to pt while RT setup BiPAP. After treatment was finished, RT, per MD order, placed pt on BiPAP. Pt tolerating well at this time. RN at bedside along with rapid response and MD x2. RT gave RN direct number, RN made aware to call RT with any changes. RT will continue to monitor pt.

## 2022-01-04 NOTE — Progress Notes (Signed)
[facility] Progress Note   Subjective:    Patient seen on HD for extra treatment when noted witnessed seizure-like activity. Patient was unresponsive and noted myoclonic jerking movements which lasted approximately 2 minutes. Patient then became postictal and lethargic. Noted patient received '1mg'$  IV Ativan pre-HD per CIWA protocol (ETOH withdrawal)-0.'5mg'$  Romazicon was given here. Patient still volume overloaded with expiratory wheezing (removed around 7.5L over the past 2 days). Still Emergency rinse-back, CXR, and EKG were performed. RT notified and patient now placed on Bipap. Rapid response, hospitalist, and Attending Dr. Jonnie Finner were notified and arrived to bedside. Critical care and Neuro were both consulted.  Objective Vitals:   01/04/22 0415 01/04/22 0748 01/04/22 0815 01/04/22 0914  BP: (!) 149/80  (!) 155/70   Pulse: 96  96   Resp: 18  20   Temp: 97.7 F (36.5 C)  97.8 F (36.6 C)   TempSrc: Oral  Oral   SpO2: 98% 97% 96% 97%  Weight:       Physical Exam General: Ill-appearing; now on Bipap Neuro: Lethargic; opens eyes to voice but quickly falls asleep' following simple commands Heart: S1 and S2; No MGR Lungs: Expiratory wheezing throughout Abdomen: Soft and non-tender Extremities: L BKA; trace edema RLE Dialysis Access: L AVF (+) B/T   Filed Weights   01/03/22 1445 01/03/22 1849  Weight: 106.3 kg 102.6 kg    Intake/Output Summary (Last 24 hours) at 01/04/2022 1008 Last data filed at 01/04/2022 0813 Gross per 24 hour  Intake 300 ml  Output 203.5 ml  Net 96.5 ml    Additional Objective Labs: Basic Metabolic Panel: Recent Labs  Lab 01/02/22 0655 01/02/22 0700 01/03/22 0129 01/04/22 0027  NA 119* 121* 129* 132*  K 4.0 4.3 3.6 3.1*  CL 83*  --  91* 94*  CO2 19*  --  26 28  GLUCOSE 137*  --  130* 149*  BUN 49*  --  26* 17  CREATININE 8.91*  --  5.04* 3.41*  CALCIUM 8.5*  --  8.5* 8.2*   Liver Function Tests: Recent Labs  Lab  01/01/22 1955 01/02/22 0655 01/03/22 0129  AST 39 42* 47*  ALT '13 13 13  '$ ALKPHOS 100 108 105  BILITOT 1.0 1.2 0.9  PROT 7.4 8.2* 8.0  ALBUMIN 2.2* 2.4* 2.4*   No results for input(s): "LIPASE", "AMYLASE" in the last 168 hours. CBC: Recent Labs  Lab 01/01/22 1955 01/01/22 2018 01/02/22 0655 01/02/22 0700 01/03/22 0129  WBC 3.9*  --  3.7*  --  3.8*  NEUTROABS  --   --  3.0  --  2.8  HGB 8.6*   < > 9.3* 9.9* 8.5*  HCT 24.5*   < > 26.2* 29.0* 24.1*  MCV 93.9  --  94.9  --  93.4  PLT 96*  --  97*  --  90*   < > = values in this interval not displayed.   Blood Culture    Component Value Date/Time   SDES BRONCHIAL ALVEOLAR LAVAGE 12/26/2021 1546   SDES BRONCHIAL ALVEOLAR LAVAGE 12/26/2021 1546   SDES BRONCHIAL ALVEOLAR LAVAGE 12/26/2021 1546   SPECREQUEST NONE 12/26/2021 1546   SPECREQUEST NONE 12/26/2021 1546   SPECREQUEST NONE 12/26/2021 1546   CULT  12/26/2021 1546    NO GROWTH 2 DAYS Performed at [facility], [place] [address] [place], [place] [address]    CULT  12/26/2021 1546    NO GROWTH 2 DAYS Performed at [facility], [address] [place], [place] [address]    CULT  12/26/2021 1546    No growth aerobically or anaerobically. Performed at [facility], [place] [address], [place], [place] [address]    REPTSTATUS 12/29/2021 FINAL 12/26/2021 1546   REPTSTATUS 12/29/2021 FINAL 12/26/2021 1546   REPTSTATUS 01/01/2022 FINAL 12/26/2021 1546    Cardiac Enzymes: No results for input(s): "CKTOTAL", "CKMB", "CKMBINDEX", "TROPONINI" in the last 168 hours. CBG: Recent Labs  Lab 01/04/22 0846  GLUCAP 88   Iron Studies: No results for input(s): "IRON", "TIBC", "TRANSFERRIN", "FERRITIN" in the last 72 hours. Lab Results  Component Value Date   INR 1.1 12/10/2021   INR 1.1 12/09/2021   INR 1.0 11/28/2021   Studies/Results: DG Chest Port 1 View  Result Date: 01/04/2022 CLINICAL DATA:  Shortness of breath and cough. EXAM: PORTABLE CHEST 1 VIEW  COMPARISON:  01/01/2022 FINDINGS: Lungs are adequately inflated and demonstrate persistent hazy airspace opacification over the left midlung and right mid to lower lung which may be due to edema or infection. Stable small right pleural effusion likely with associated basilar atelectasis. Cardiomediastinal silhouette and remainder of the exam is unchanged. IMPRESSION: Persistent hazy airspace process over the left midlung and right mid to lower lung which may be due to edema or infection. Stable small right pleural effusion likely with associated basilar atelectasis. Electronically Signed   By: Marin Olp M.D.   On: 01/04/2022 10:00   DG Chest Port 1 View  Result Date: 01/04/2022 CLINICAL DATA:  Shortness of breath during dialysis EXAM: PORTABLE CHEST 1 VIEW COMPARISON:  Earlier today FINDINGS: Bilateral pulmonary airspace opacity and small right pleural effusion, unchanged. Cardiomegaly. IMPRESSION: Unchanged pulmonary infiltrates and small right pleural effusion. Electronically Signed   By: Jorje Guild M.D.   On: 01/04/2022 09:29    Medications:  anticoagulant sodium citrate     levETIRAcetam      amLODipine  5 mg Oral Daily   atorvastatin  40 mg Oral Daily   Chlorhexidine Gluconate Cloth  6 each Topical Q0600   Chlorhexidine Gluconate Cloth  6 each Topical Q0600   Chlorhexidine Gluconate Cloth  6 each Topical [id number]   folic acid  1 mg Oral Daily   gabapentin  300 mg Oral QHS   heparin  5,000 Units Subcutaneous Q8H   ipratropium-albuterol  3 mL Nebulization Q4H   lidocaine  1 patch Transdermal Q24H   melatonin  5 mg Oral QHS   multivitamin with minerals  1 tablet Oral Daily   nicotine  21 mg Transdermal Daily   pantoprazole  40 mg Oral BID AC   sodium chloride flush  3 mL Intravenous Q12H   thiamine  100 mg Oral Daily   traZODone  50 mg Oral QHS    Dialysis Orders: MWF [facility] 4h  450/ 500   101kg   2/2 bath  Hep none  LUA AVF  hep B labs 8/14 - last HD 8/28 outpt, post  103.7kg, coming in 8- 12kg over x 2 wks - last Hb 8.0 on 8/21  Assessment/Plan: Seizure-like activity: Neuro consulted. Patient again examined by Neurologist: No focality on exam. Possible withdrawal seizure? Per neuro: Give Keppra 1GM IV now with HD now and '500mg'$  after HD. Head CT and EEG ordered. MRI Brain when able. Cont CIWA protocol. Acute on chronic hypoxic respiratory failure/chronic diastolic CHF: Removed 0.6Y with HD over past 2 days but he remains severely overloaded. Repeat CXR showed no improvement. Patient now on Bipap. Critical  care team consulted. Plan to re-start HD and monitor his status very closely. Plan to transfer to ICU. ESRD - on HD MWF. Missed his last 2 HD sessions this week due to transportation troubles. Please see above. Hyponatremia - likely from vol overload, rx w/ HD for volume removal, and fluid restriction. Current Na 132 ETOH abuse - per primary-cont CIWA HTN/ vol - BP's elevated, cont home meds, get volume down with HD. H/o [place] - f/b oncology  ADDENDUM 01/04/22 at 9:40am. Patient had another seizure which lasted 3 minutes. Rapid response notified and Attending Dr. Jonnie Finner made aware. Patient unstable to continue HD on the unit today. Patient will be transferred to 36M.  Tobie Poet, NP [facility] 01/04/2022,10:08 AM  LOS: 2 days

## 2022-01-04 NOTE — Progress Notes (Signed)
Report given to [facility] (ICU).

## 2022-01-04 NOTE — Progress Notes (Signed)
Patient lungs have loud audible wheezes. Patient removed nasal canula and refused to reapply it. RN and RT educated patient on BiPAP use. Patient refused. O2 98% on room air, wheezes audible from doorway of room. Patient demanding food- NPO on this time. Educated with no evidence of learning.

## 2022-01-04 NOTE — Progress Notes (Signed)
Received patient in bed to unit. 01/03/22 Alert and oriented.  Informed consent signed and in chart.   Treatment initiated:  Treatment completed: 5643  Patient tolerated well.  Transported back to the room  Alert, without acute distress.  Hand-off given to patient's nurse.   Access used: Avfistula Access issues: none  Total UF removed: 3.5 Medication(s) given: none Post HD VS: 329/51,88,41,660 Post HD weight: 102.6kg   [facility]

## 2022-01-04 NOTE — Consult Note (Signed)
Neurology Consultation Reason for Consult: Seizure Requesting Physician: Margaretha Seeds  CC: Shortness of breath   History is obtained from: Primarily chart review and   HPI: Daniel [name] is a 61 y.o. 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  Per NP at bedside who witnessed to the event the patient was somnolent initially, then had bilateral shaking of the upper extremities predominantly with eyes rolled up and back lasting about 2 minutes and self terminating.  No gaze deviation, no yelling out before hand, no head turn to either side.  Vital signs remained stable throughout the event with no tachycardia hypo or hypertension or desaturation.  Postevent he was very lethargic but squeezed equally on both sides and had no focal deficits.  Because he was lethargic afterwards and he had recently received Ativan he was reversed with flumazenil 0.5 mg.  He had been on nasal oxygen but was started on BiPAP after the seizure event more due to wheezing and volume overload in combination with lethargy rather than primarily due to postictal state  Other sedating medications he has received recently include Benadryl 25 mg at midnight for itching  He was recently admitted from 8/30 to 9/1 due to volume overload and possible multifocal pneumonia s/p bronchoscopy.  Symptoms improved with dialysis but unfortunately due to transportation issues he missed 2 dialysis sessions and returned with volume overload  again on 9/6   At baseline he lives alone and drives himself to dialysis but is wheelchair-bound, transfers by himself and takes care of himself   ROS: Unable to obtain due to altered mental status.   Past Medical History:  Diagnosis Date   Anemia    Diabetes mellitus without complication [facility])    patient denies   Dialysis patient [facility])    End stage chronic kidney disease ([place])    Hypertension    ICH (intracerebral hemorrhage) ([place]) 05/20/2017   PAD (peripheral artery disease) ([facility])    Shoulder pain, left 06/28/2013   Surgical history reviewed, see chart for full details   Family History  Problem Relation Age of Onset   Colon cancer Neg Hx     Social History:  reports that he has been smoking cigarettes. He has a 22.50 pack-year smoking history. He has never used smokeless tobacco. He reports that he does not currently use alcohol. He reports that he does not currently use drugs after having used the following drugs: "Crack" cocaine.  Exam: Current vital signs: BP (!) 155/70 (BP Location: Right Arm)   Pulse 96   Temp 97.8 F (36.6 C) (Oral)   Resp 20   Wt 102.6 kg   SpO2 97%   BMI 31.55 kg/m  Vital signs in last 24 hours: Temp:  [97.7 F (36.5 C)-98 F (36.7 C)] 97.8 F (36.6 C) (09/09 0815) Pulse Rate:  [76-99] 96 (09/09 0815) Resp:  [11-22] 20 (09/09 0815) BP: (134-161)/(67-98) 155/70 (09/09 0815) SpO2:  [96 %-100 %] 97 % (09/09 0914) FiO2 (%):  [100 %] 100 % (09/09 0914) Weight:  [102.6 kg-106.3 kg] 102.6 kg (09/08 1849)   Physical Exam  Constitutional: Chronically ill-appearing Psych: Minimally interactive, slightly  irritable when awoken but redirectable Eyes: No scleral injection HENT: BiPAP in place MSK: Left BKA Cardiovascular: Normal rate and regular rhythm.  Respiratory: Effort normal, non-labored breathing GI: Soft.  No distension. There is no tenderness.  Skin: Warm dry and intact visible skin  Neuro: Mental Status: Lethargic initially but  after repeated noxious stimulation and awoke and followed commands.  Language exam limited secondary to BiPAP in place but comprehension intact Cranial Nerves: II: Visual Fields are full. Pupils are equal, round, and reactive to light.  III,IV, VI: EOMI without ptosis or diploplia.  Saccadic movements, no clear nystagmus V: Facial sensation is symmetric to light eyelash brush VII: Facial movement is symmetric within limits of assessment due to BiPAP VIII: hearing is intact to voice Motor / sensory: Notable asterixis versus possibly ataxia.  Antigravity in the bilateral upper extremities after repeated noxious stimulation.  Antigravity in bilateral lower extremities.  Symmetric response throughout to noxious stim  I have reviewed labs in epic and the results pertinent to this consultation are:  Basic Metabolic Panel: Recent Labs  Lab 01/01/22 1955 01/01/22 2018 01/02/22 0655 01/02/22 0700 01/03/22 0129 01/04/22 0027  NA 120* 120* 119* 121* 129* 132*  K 4.4 4.6 4.0 4.3 3.6 3.1*  CL 85* 90* 83*  --  91* 94*  CO2 21*  --  19*  --  26 28  GLUCOSE 91 91 137*  --  130* 149*  BUN 44* 40* 49*  --  26* 17  CREATININE 8.68* 9.30* 8.91*  --  5.04* 3.41*  CALCIUM 8.5*  --  8.5*  --  8.5* 8.2*  MG  --   --   --   --  1.6* 1.9   CBC: Recent Labs  Lab 01/01/22 1955 01/01/22 2018 01/02/22 0655 01/02/22 0700 01/03/22 0129  WBC 3.9*  --  3.7*  --  3.8*  NEUTROABS  --   --  3.0  --  2.8  HGB 8.6* 9.2* 9.3* 9.9* 8.5*  HCT 24.5* 27.0* 26.2* 29.0* 24.1*  MCV 93.9  --  94.9  --  93.4  PLT 96*  --  97*  --  90*   I have reviewed the images obtained:  Head CT from 02/2021 personally reviewed, no clear seizure focus though there is some generalized atrophy and mild chronic microvascular changes (this was obtained for increased twitching x4 days, hallucinations, concern for seizure)  Impression: Seizure-like activity, possibly withdrawal seizure.  No focality on examination and given multiple  provoking factors at this time (continues to have electrolyte derangements in addition to alcohol withdrawal), would not necessarily start antiseizure medications based on information to date.  Would like an MRI brain to complete work-up but this will need to wait until his respiratory status is stabilized and will have to be without contrast given his ESRD.  In the interim we will obtain a dry head CT when patient is stabilized.  Also will cover for seizures with Keppra for now but do not anticipate continuing this long term at this time.  Recommendations:  #Seizure-like activity - UA, UDS - Keppra 1000 mg now, then 500 mg after hemodialysis if mental status allows  - Continue CIWA protocol, continued doses of benzos likely needed to control his withdrawal but appreciate dose adjustments for mental status and medical conditions per primary team - Please consider discontinuing other sedating medications (Ambien, Benadryl ordered) - Head CT after dialysis (given too unstable to lay flat due to severe volume overload and tenuous respiratory  status on BiPAP) - MRI brain without contrast when able (not yet ordered) - EEG   #Alcohol abuse -Agree with folate, multivitamin, thiamine  Appreciate management of other comorbidities per primary team, neurology will continue to follow along.  Communications conveyed via secure chat  Lesleigh Noe MD-PhD [facility] [phone] Available 7 AM to 7 PM, outside these hours please contact Neurologist on call listed on [place] Performed by: Lorenza Chick   Total critical care time: 40 minutes  Critical care time was exclusive of separately billable procedures and treating other patients.  Critical care was necessary to treat or prevent imminent or life-threatening deterioration.  Critical care was time spent personally by me on the following activities: development of treatment plan with patient and/or surrogate as well  as nursing, discussions with consultants, evaluation of patient's response to treatment, examination of patient, obtaining history from patient or surrogate, ordering and performing treatments and interventions, ordering and review of laboratory studies, ordering and review of radiographic studies, pulse oximetry and re-evaluation of patient's condition.

## 2022-01-04 NOTE — Progress Notes (Signed)
PT Cancellation Note  Patient Details Name: Daniel [name] MRN: [id number] DOB: January 12, 1961   Cancelled Treatment:    Reason Eval/Treat Not Completed: Patient at procedure or test/unavailable (HD)  Wyona Almas, PT, DPT Acute Rehabilitation Services Office [place] 01/04/2022, 8:50 AM

## 2022-01-04 NOTE — Progress Notes (Signed)
EEG complete - results pending 

## 2022-01-04 NOTE — Significant Event (Addendum)
Rapid Response Event Note   Reason for Call :  Witnessed 2 minute seizure Respiratory distress with hypoxemia or uremia  Initial Focused Assessment:  Patient lying in bed with audible wheezing. Patient is asleep but will wake up with tactile stimulation. He will quickly fall back asleep. Patient is only oriented to self and place. He told me that the year was 1964. Patient is currently on CIWA protocol and had received    1 mg at 0757. Patient is following commands.   153/88 HR 92 RR 18 (on bipap) O2 100%  1000- Called back to hemodialysis because the patient had another witnessed seizure. RN reports that the patient was having full body convulsions and the event lasted 3 minutes. Patient had not received his dose of AED before this event.   Interventions:  Patient given albuterol treatment Patient placed on bipap 0.5 mg Flumazenil given Chest xray   Plan of Care:  Patient will receive their HD treatment. Patient will remain on Bipap during treatment. Bedside tech educated to remove bipap if patient becomes nauseous or has to vomit.  Patient will be admitted to ICU after HD treatment     Event Summary:   MD Notified: Nephro MD, Dr. Candiss Norse, and CCM NP bedside Call Time: [phone] Arrival Time: 0845 End Time: [place]  Venetia Maxon, RN

## 2022-01-04 NOTE — Procedures (Signed)
Patient Name: Daniel [name]  MRN: [id number]  Epilepsy Attending: Lora Havens  Referring Physician/Provider: Thurnell Lose, MD  Date: 01/04/2022 Duration: 23.16 mins  Patient history: 61yo m on eeg to evaluate for seizure  Level of alertness: Awake, asleep  AEDs during EEG study: GBP, Ativan  Technical aspects: This EEG study was done with scalp electrodes positioned according to the 10-20 International system of electrode placement. Electrical activity was reviewed with band pass filter of 1-'70Hz'$ , sensitivity of 7 uV/mm, display speed of 64m/sec with a '60Hz'$  notched filter applied as appropriate. EEG data were recorded continuously and digitally stored.  Video monitoring was available and reviewed as appropriate.  Description: The posterior dominant rhythm consists of 8-9 Hz activity of moderate voltage (25-35 uV) seen predominantly in posterior head regions, symmetric and reactive to eye opening and eye closing. Sleep was characterized by vertex waves, sleep spindles (12 to 14 Hz), maximal frontocentral region. Hyperventilation and photic stimulation were not performed.     IMPRESSION: This study is within normal limits. No seizures or epileptiform discharges were seen throughout the recording.  A normal interictal EEG does not exclude nor support the diagnosis of epilepsy.   Daniel [name] Daniel [name]

## 2022-01-04 NOTE — Progress Notes (Signed)
HOSPITAL MEDICINE OVERNIGHT EVENT NOTE    Notified by nursing that patient has been complaining of diffuse itching.  No associated rash.  Patient reports that this is chronic and patient intermittently responds to as needed Benadryl.  Based on the description this appears to be a longstanding case of uremic pruritus.  We will go ahead and try a trial of as needed Benadryl.  Advised nursing to go ahead and apply what ever lotion or emollient is available on the unit for additional relief.  Vernelle Emerald  MD [facility]

## 2022-01-04 NOTE — Progress Notes (Signed)
RT assisted with transport of this pt from HD to 2M6 while on BiPAP. Pt tolerated well with SVS. RT gave report to 2MRT.

## 2022-01-04 NOTE — Progress Notes (Signed)
Pt refuses assistance with ambulation to the bathroom. Educated pt on fall risk and importance of having staff member at bedside to assist with ambulation. Pt refuses all assistance at this time with toileting.[name]

## 2022-01-04 NOTE — Progress Notes (Addendum)
PROGRESS NOTE                                                                                                                                                                                                             Patient Demographics:    Daniel [name], is a 61 y.o. male, DOB - [birth year], UTM:[id number]  Outpatient Primary MD for the patient is Daniel Perna, NP    LOS - 2  Admit date - 01/01/2022    Chief Complaint  Patient presents with   Shortness of Breath       Brief Narrative (HPI from H&P)   61 year old male with past medical history of Hepatitis C cirrhosis,  hepatocellular carcinoma (Dx 07/2021, follows with Dr. Burr [name], S/P Y90 ablation 11/28/2021), iron deficiency anemia, GAVE and duodenal AVMs, ESRD (HD MWF), history of subarachnoid hemorrhage (2019), peripheral vascular disease (S/P right SFA stent), status post left BKA, diastolic congestive heart failure, pulmonary hypertension, nicotine dependence (cigarettes), gastroesophageal reflux disease who presents to [facility] emergency department with complaints of shortness of breath.  He was recently admitted for similar complaints and was diagnosed with volume overload.    Post discharge she went home and missed his last 2 dialysis treatments due to a car breakdown and transport issues, he presented again with shortness of breath was diagnosed with volume overload again.    Subjective:    Daniel [name] today has, No headache, No chest pain, No abdominal pain - No Nausea, No new weakness tingling or numbness, +ve SOB and wheezing.   Assessment  & Plan :    Assessment and Plan: Acute hypoxic respiratory failure due to acute on chronic diastolic CHF caused by missed dialysis treatments and resultant volume overload with underlying pulmonary edema, bilateral pleural effusions - he has been strictly counseled on compliance with dialysis treatments,  this morning he is short of breath and wheezing, have discussed with nephrology for urgent dialysis on 01/04/2022, for now oxygen, elevate the head of the bed, obtain a repeat chest x-ray, nebulizer treatment and monitor.  Acute on chronic diastolic CHF EF 56% on last echocardiogram in February 2023.  Please see above.    ESRD (end stage renal disease) on dialysis [facility]) - Patient typically receives hemodialysis on Mondays Wednesdays and Fridays, as above missed 2 dialysis treatments due to transport issues,  counseled, nephrology on board already on dialysis again on 01/04/2022.  Essential hypertension - Resume Norvasc and add as needed hydralazine.  Hepatocellular carcinoma [facility]) - Outpatient follow-up with Dr. Burr [name], Status post Y90 ablation 11/28/2021.  Mixed hyperlipidemia - Continue home regimen of statin therapy  GERD without esophagitis - Continuing home regimen of daily PPI therapy.  Nicotine dependence, ongoing smoking and alcohol abuse.  Counseled to quit all.  Monitor on CIWA protocol currently no DTs.  Hypokalemia and hypomagnesemia.  Replaced.    Addendum 5 minutes after patient went into HD treatment he had a seizure-like episode after which he became postictal and lethargic, he still has wheezing, nephrology wanted to hold dialysis till he wakes up more and stabilizes, will transfer him to ICU on BiPAP, HD in ICU, head CT EEG, chest x-ray and neurology input requested.  Discussed with ICU team.       Condition - Fair  Family Communication  : brother Daniel [name] [phone] on 01/04/2022 at 9:18 AM message left  Code Status :  Full  Consults  :  Renal  PUD Prophylaxis : PPI   Procedures  :          Disposition Plan  :    Status is: Inpatient  DVT Prophylaxis  :    heparin injection 5,000 Units Start: 01/02/22 0600  Lab Results  Component Value Date   PLT 90 (L) 01/03/2022    Diet :  Diet Order             Diet renal with fluid restriction Fluid restriction:  1200 mL Fluid; Room service appropriate? Yes; Fluid consistency: Thin  Diet effective now                    Inpatient Medications  Scheduled Meds:  albuterol  2.5 mg Nebulization Once   amLODipine  5 mg Oral Daily   atorvastatin  40 mg Oral Daily   Chlorhexidine Gluconate Cloth  6 each Topical Q0600   Chlorhexidine Gluconate Cloth  6 each Topical Q0600   Chlorhexidine Gluconate Cloth  6 each Topical O9629   folic acid  1 mg Oral Daily   gabapentin  300 mg Oral QHS   heparin  5,000 Units Subcutaneous Q8H   ipratropium-albuterol  3 mL Nebulization Q4H   lidocaine  1 patch Transdermal Q24H   melatonin  5 mg Oral QHS   multivitamin with minerals  1 tablet Oral Daily   nicotine  21 mg Transdermal Daily   pantoprazole  40 mg Oral BID AC   sodium chloride flush  3 mL Intravenous Q12H   thiamine  100 mg Oral Daily   traZODone  50 mg Oral QHS   Continuous Infusions: PRN Meds:.acetaminophen **OR** acetaminophen, albuterol, diphenhydrAMINE, hydrALAZINE, LORazepam **OR** LORazepam, ondansetron **OR** ondansetron (ZOFRAN) IV, polyethylene glycol, zolpidem  Antibiotics  :    Anti-infectives (From admission, onward)    Start     Dose/Rate Route Frequency Ordered Stop   01/02/22 0245  cefTRIAXone (ROCEPHIN) 1 g in sodium chloride 0.9 % 100 mL IVPB        1 g 200 mL/hr over 30 Minutes Intravenous  Once 01/02/22 0238 01/02/22 0325   01/02/22 0245  doxycycline (VIBRA-TABS) tablet 100 mg        100 mg Oral  Once 01/02/22 0238 01/02/22 0254        Time Spent in minutes  Daniel [name] M.D on 01/04/2022 at 8:19 AM  To page go  to [email or web address]   [facility] -  Office  [phone]  See all Orders from today for further details    Objective:   Vitals:   01/04/22 0115 01/04/22 0354 01/04/22 0415 01/04/22 0815  BP: (!) 147/67  (!) 149/80 (!) 155/70  Pulse: 89 99 96 96  Resp:  '20 18 20  '$ Temp:   97.7 F (36.5 C) 97.8 F (36.6 C)  TempSrc:   Oral Oral  SpO2:  99%  98% 96%  Weight:        Wt Readings from Last 3 Encounters:  01/03/22 102.6 kg  12/27/21 97.6 kg  12/24/21 108.9 kg     Intake/Output Summary (Last 24 hours) at 01/04/2022 0819 Last data filed at 01/04/2022 0100 Gross per 24 hour  Intake 580 ml  Output 203.5 ml  Net 376.5 ml     Physical Exam  Awake Alert, No new F.N deficits, Normal affect [place].AT,PERRAL Supple Neck, No JVD,   Symmetrical Chest wall movement, Good air movement bilaterally, ++ wheezing RRR,No Gallops,Rubs or new Murmurs,  +ve B.Sounds, Abd Soft, No tenderness,   No Cyanosis, L. BKA    RN pressure injury documentation: Pressure Injury 07/04/21 Buttocks Medial Stage 2 -  Partial thickness loss of dermis presenting as a shallow open injury with a red, pink wound bed without slough. two small areas of broken skin in between buttocks. (Active)  07/04/21 0936  Location: Buttocks  Location Orientation: Medial  Staging: Stage 2 -  Partial thickness loss of dermis presenting as a shallow open injury with a red, pink wound bed without slough.  Wound Description (Comments): two small areas of broken skin in between buttocks.  Present on Admission:      Data Review:    CBC Recent Labs  Lab 01/01/22 1955 01/01/22 2018 01/02/22 0655 01/02/22 0700 01/03/22 0129  WBC 3.9*  --  3.7*  --  3.8*  HGB 8.6* 9.2* 9.3* 9.9* 8.5*  HCT 24.5* 27.0* 26.2* 29.0* 24.1*  PLT 96*  --  97*  --  90*  MCV 93.9  --  94.9  --  93.4  MCH 33.0  --  33.7  --  32.9  MCHC 35.1  --  35.5  --  35.3  RDW 18.1*  --  18.1*  --  18.2*  LYMPHSABS  --   --  0.3*  --  0.3*  MONOABS  --   --  0.3  --  0.7  EOSABS  --   --  0.0  --  0.0  BASOSABS  --   --  0.0  --  0.0    Electrolytes Recent Labs  Lab 01/01/22 1955 01/01/22 2018 01/02/22 0655 01/02/22 0700 01/03/22 0129 01/04/22 0027  NA 120* 120* 119* 121* 129* 132*  K 4.4 4.6 4.0 4.3 3.6 3.1*  CL 85* 90* 83*  --  91* 94*  CO2 21*  --  19*  --  26 28  GLUCOSE 91 91 137*  --   130* 149*  BUN 44* 40* 49*  --  26* 17  CREATININE 8.68* 9.30* 8.91*  --  5.04* 3.41*  CALCIUM 8.5*  --  8.5*  --  8.5* 8.2*  AST 39  --  42*  --  47*  --   ALT 13  --  13  --  13  --   ALKPHOS 100  --  108  --  105  --   BILITOT 1.0  --  1.2  --  0.9  --   ALBUMIN 2.2*  --  2.4*  --  2.4*  --   MG  --   --   --   --  1.6* 1.9  BNP  --   --  1,034.3*  --   --   --     ID Labs Recent Labs  Lab 01/01/22 1955 01/01/22 2018 01/02/22 0655 01/03/22 0129 01/04/22 0027  WBC 3.9*  --  3.7* 3.8*  --   PLT 96*  --  97* 90*  --   CREATININE 8.68* 9.30* 8.91* 5.04* 3.41*    Radiology Reports DG Chest 2 View  Result Date: 01/01/2022 CLINICAL DATA:  Shortness of breath.  Missed dialysis. EXAM: CHEST - 2 VIEW COMPARISON:  Radiograph 12/26/2021, CT 12/25/2021 FINDINGS: Chronic cardiomegaly. Unchanged mediastinal contours with aortic atherosclerosis and tortuosity. Right greater than left pleural effusions with slight increase. Patchy airspace disease in the left mid lung persists but has decreased in density. Patchy airspace disease at the right lung base has worsened. Vascular congestion. No pneumothorax. Left axillary vascular stent. IMPRESSION: 1. Increased pleural effusions.  Vascular congestion. 2. Shifting airspace disease. Improvement in left perihilar opacities, patchy airspace disease at the right lung base has worsened. Electronically Signed   By: Keith Rake M.D.   On: 01/01/2022 20:38

## 2022-01-05 ENCOUNTER — Inpatient Hospital Stay (HOSPITAL_COMMUNITY): Payer: Medicare Other

## 2022-01-05 DIAGNOSIS — I5033 Acute on chronic diastolic (congestive) heart failure: Secondary | ICD-10-CM | POA: Diagnosis not present

## 2022-01-05 DIAGNOSIS — E8770 Fluid overload, unspecified: Secondary | ICD-10-CM | POA: Diagnosis not present

## 2022-01-05 DIAGNOSIS — F1010 Alcohol abuse, uncomplicated: Secondary | ICD-10-CM | POA: Diagnosis not present

## 2022-01-05 DIAGNOSIS — J810 Acute pulmonary edema: Secondary | ICD-10-CM | POA: Diagnosis not present

## 2022-01-05 LAB — RENAL FUNCTION PANEL
Albumin: 2.2 g/dL — ABNORMAL LOW (ref 3.5–5.0)
Albumin: 2.1 g/dL — ABNORMAL LOW (ref 3.5–5.0)
Anion gap: 9 (ref 5–15)
Anion gap: 10 (ref 5–15)
BUN: 12 mg/dL (ref 8–23)
BUN: 15 mg/dL (ref 8–23)
CO2: 27 mmol/L (ref 22–32)
CO2: 27 mmol/L (ref 22–32)
Calcium: 8.2 mg/dL — ABNORMAL LOW (ref 8.9–10.3)
Calcium: 8.3 mg/dL — ABNORMAL LOW (ref 8.9–10.3)
Chloride: 94 mmol/L — ABNORMAL LOW (ref 98–111)
Chloride: 95 mmol/L — ABNORMAL LOW (ref 98–111)
Creatinine, Ser: 3.13 mg/dL — ABNORMAL HIGH (ref 0.61–1.24)
Creatinine, Ser: 4 mg/dL — ABNORMAL HIGH (ref 0.61–1.24)
GFR, Estimated: 22 mL/min — ABNORMAL LOW (ref >60)
GFR, Estimated: 16 mL/min — ABNORMAL LOW (ref >60)
Glucose, Bld: 137 mg/dL — ABNORMAL HIGH (ref 70–99)
Glucose, Bld: 133 mg/dL — ABNORMAL HIGH (ref 70–99)
Phosphorus: 1.4 mg/dL — ABNORMAL LOW (ref 2.5–4.6)
Phosphorus: 2.4 mg/dL — ABNORMAL LOW (ref 2.5–4.6)
Potassium: 3.8 mmol/L (ref 3.5–5.1)
Potassium: 4 mmol/L (ref 3.5–5.1)
Sodium: 130 mmol/L — ABNORMAL LOW (ref 135–145)
Sodium: 132 mmol/L — ABNORMAL LOW (ref 135–145)

## 2022-01-05 LAB — GLUCOSE, CAPILLARY
Glucose-Capillary: 139 mg/dL — ABNORMAL HIGH (ref 70–99)
Glucose-Capillary: 126 mg/dL — ABNORMAL HIGH (ref 70–99)
Glucose-Capillary: 149 mg/dL — ABNORMAL HIGH (ref 70–99)
Glucose-Capillary: 129 mg/dL — ABNORMAL HIGH (ref 70–99)
Glucose-Capillary: 124 mg/dL — ABNORMAL HIGH (ref 70–99)

## 2022-01-05 LAB — CBC
HCT: 23.8 % — ABNORMAL LOW (ref 39.0–52.0)
Hemoglobin: 8.2 g/dL — ABNORMAL LOW (ref 13.0–17.0)
MCH: 33.6 pg (ref 26.0–34.0)
MCHC: 34.5 g/dL (ref 30.0–36.0)
MCV: 97.5 fL (ref 80.0–100.0)
Platelets: 75 10*3/uL — ABNORMAL LOW (ref 150–400)
RBC: 2.44 MIL/uL — ABNORMAL LOW (ref 4.22–5.81)
RDW: 19.1 % — ABNORMAL HIGH (ref 11.5–15.5)
WBC: 3.4 10*3/uL — ABNORMAL LOW (ref 4.0–10.5)
nRBC: 0 % (ref 0.0–0.2)

## 2022-01-05 LAB — HEPATITIS B SURFACE ANTIBODY, QUANTITATIVE: Hep B S AB Quant (Post): 47.2 m[IU]/mL (ref >9.9)

## 2022-01-05 LAB — MAGNESIUM: Magnesium: 1.7 mg/dL (ref 1.7–2.4)

## 2022-01-05 LAB — PHOSPHORUS: Phosphorus: 1.5 mg/dL — ABNORMAL LOW (ref 2.5–4.6)

## 2022-01-05 MED ORDER — DEXTROSE 5 % IV SOLN
15.0000 mmol | Freq: Once | INTRAVENOUS | Status: AC
  Administered 2022-01-05: 15 mmol via INTRAVENOUS
  Filled 2022-01-05: qty 5

## 2022-01-05 MED ORDER — HEPARIN SODIUM (PORCINE) 1000 UNIT/ML DIALYSIS: 1000.0000 [IU] | INTRAMUSCULAR | Status: DC | PRN

## 2022-01-05 MED ORDER — MAGNESIUM SULFATE 2 GM/50ML IV SOLN
2.0000 g | Freq: Once | INTRAVENOUS | Status: AC
Start: 2022-01-05 — End: 2022-01-05
  Administered 2022-01-05: 2 g via INTRAVENOUS
  Filled 2022-01-05: qty 50

## 2022-01-05 MED ORDER — ALTEPLASE 2 MG IJ SOLR: 2.0000 mg | Freq: Once | INTRAMUSCULAR | Status: DC | PRN

## 2022-01-05 MED ORDER — LORAZEPAM 2 MG/ML IJ SOLN
1.0000 mg | INTRAMUSCULAR | Status: DC | PRN
  Administered 2022-01-05 – 2022-01-07 (×2): 2 mg via INTRAVENOUS
  Filled 2022-01-05: qty 1

## 2022-01-05 MED ORDER — LIDOCAINE-PRILOCAINE 2.5-2.5 % EX CREA: 1.0000 | TOPICAL_CREAM | CUTANEOUS | Status: DC | PRN

## 2022-01-05 MED ORDER — PENTAFLUOROPROP-TETRAFLUOROETH EX AERO
1.0000 | INHALATION_SPRAY | CUTANEOUS | Status: DC | PRN
Start: 2022-01-05 — End: 2022-01-06

## 2022-01-05 MED ORDER — LIDOCAINE HCL (PF) 1 % IJ SOLN: 5.0000 mL | INTRAMUSCULAR | Status: DC | PRN

## 2022-01-05 MED ORDER — LORAZEPAM 1 MG PO TABS
1.0000 mg | ORAL_TABLET | ORAL | Status: DC | PRN
  Administered 2022-01-05: 1 mg via ORAL
  Filled 2022-01-05: qty 1

## 2022-01-05 NOTE — Evaluation (Signed)
Physical Therapy Evaluation  Patient Details Name: Daniel [name] MRN: [id number] DOB: February 25, 1961 Today's Date: 01/05/2022  History of Present Illness  Pt is a 61 y/o male who presented to [facility] ED 01/01/2022 with shortness of breath 2 volume overload. Pt had 2 witnessed seizures while in HD on 01/04/22 and was transferred to ICU. PMH includes: hepatocellular carcinoma-s/p Y90 ablation on 8/3, anemia, recent GI bleeding due to AVMs, DM, ESRD on HD (MWF), HTN, ICD, PAD s/p L BKA.   Clinical Impression  Pt admitted with above diagnosis. Pt currently with functional limitations due to the deficits listed below (see PT Problem List). At the time of PT eval pt was able to perform transfers to/from EOB with supervision for general safety. Pt declined attempting any OOB mobility at this time. States he has been transferring to/from the [facility] without assist. Pt reports he has gotten a prosthetic for the LLE but has received no follow up. Recommending HH PT for prosthetic training as his transportation will likely be limited 2 seizures. Acutely, pt will benefit from skilled PT to increase their independence and safety with mobility to allow discharge to the venue listed below.          Recommendations for follow up therapy are one component of a multi-disciplinary discharge planning process, led by the attending physician.  Recommendations may be updated based on patient status, additional functional criteria and insurance authorization.  Follow Up Recommendations Home health PT (For prosthetic training - unable to drive to outpatient appts 2 seizures while admitted)      Assistance Recommended at Discharge PRN  Patient can return home with the following  A little help with walking and/or transfers;A little help with bathing/dressing/bathroom;Assistance with cooking/housework;Help with stairs or ramp for entrance;Assist for transportation    Equipment Recommendations None recommended by PT   Recommendations for Other Services       Functional Status Assessment Patient has had a recent decline in their functional status and demonstrates the ability to make significant improvements in function in a reasonable and predictable amount of time.     Precautions / Restrictions Precautions Precautions: Fall Precaution Comments: L BKA - has prosthesis but not present at the hospital as of 01/05/22. Restrictions Weight Bearing Restrictions: Yes LLE Weight Bearing: Non weight bearing      Mobility  Bed Mobility Overal bed mobility: Needs Assistance Bed Mobility: Supine to Sit     Supine to sit: Supervision     General bed mobility comments: Use of bed rail for support. Pt did not require any hands on assist or guarding, but appeared effortful and increased time taken. Supervision provided for safety.    Transfers                   General transfer comment: Pt declined any attempts at [place]. Verbally sequenced how he transfers at home and how he has been transferring to the [facility] but declined practicing during session.    Ambulation/Gait               General Gait Details: Pt is non-ambulatory at baseline.  Stairs            Wheelchair Mobility    Modified Rankin (Stroke Patients Only)       Balance Overall balance assessment: Needs assistance Sitting-balance support: Feet supported, Single extremity supported Sitting balance-Leahy Scale: Fair Sitting balance - Comments: single LE support, tends to sit with 1-2 UE support for comfort.   Standing balance support:  (  Unable to assess this session)                                 Pertinent Vitals/Pain Pain Assessment Pain Assessment: No/denies pain    Home Living Family/patient expects to be discharged to:: Private residence Living Arrangements: Alone   Type of Home: House Home Access: Level entry       Home Layout: Two level Home Equipment: [place] (2 wheels) Additional Comments: states his PCP is working on getting an Tree surgeon. Shower is upstairs so he sponge bathes on main level. - Per OT eval    Prior Function Prior Level of Function : Driving;Independent/Modified Independent             Mobility Comments: just got his prosthetic leg but states no one has worked with him on using it. home mostly but does drive. squat-pivot for functional transfers, stands once in a "blue moon" ADLs Comments: mod I with basic ADLs     Hand Dominance   Dominant Hand: Right    Extremity/Trunk Assessment   Upper Extremity Assessment Upper Extremity Assessment: Generalized weakness    Lower Extremity Assessment Lower Extremity Assessment: Generalized weakness;LLE deficits/detail;RLE deficits/detail RLE Deficits / Details: MMT - 4/5 quads, hamstrings, ankle DF, hip flexors. LLE Deficits / Details: BKA    Cervical / Trunk Assessment Cervical / Trunk Assessment: Normal (Forward head posture with rounded shoulders)  Communication   Communication: No difficulties  Cognition Arousal/Alertness: Awake/alert Behavior During Therapy: Flat affect Overall Cognitive Status: No family/caregiver present to determine baseline cognitive functioning                                 General Comments: decreased problem solving, likely baseline        General Comments General comments (skin integrity, edema, etc.): Pt on RA throughout session, wheezing and coughing at times. Pt adamantly refused to don pulse-ox, stating "I don't need that, my oxygen is fine."    Exercises     Assessment/Plan    PT Assessment Patient needs continued PT services  PT Problem List Decreased strength;Decreased activity tolerance;Decreased balance;Decreased mobility;Decreased safety awareness;Decreased knowledge of precautions       PT Treatment Interventions DME instruction;Gait training;Functional mobility training;Therapeutic  activities;Therapeutic exercise;Balance training;Patient/family education    PT Goals (Current goals can be found in the Care Plan section)  Acute Rehab PT Goals Patient Stated Goal: Get some sleep PT Goal Formulation: With patient Time For Goal Achievement: 01/12/22 Potential to Achieve Goals: Good Additional Goals Additional Goal #1: Pt will be able to self-propel 120' in wheelchair with modified independence.    Frequency Min 3X/week     Co-evaluation               AM-PAC PT "6 Clicks" Mobility  Outcome Measure Help needed turning from your back to your side while in a flat bed without using bedrails?: A Little Help needed moving from lying on your back to sitting on the side of a flat bed without using bedrails?: A Little Help needed moving to and from a bed to a chair (including a wheelchair)?: A Little Help needed standing up from a chair using your arms (e.g., wheelchair or bedside chair)?: A Lot Help needed to walk in hospital room?: Total Help needed climbing 3-5 steps with a railing? : Total 6 Click Score:  13    End of Session   Activity Tolerance: Patient tolerated treatment well Patient left: in bed;with call bell/phone within reach Nurse Communication: Mobility status;Other (comment) (Pt asking for bear hugger warm blanket, ice cream, and ice.) PT Visit Diagnosis: Other abnormalities of gait and mobility (R26.89)    Time: 9470-9628 PT Time Calculation (min) (ACUTE ONLY): 17 min   Charges:   PT Evaluation $PT Eval Moderate Complexity: 1 Mod          Rolinda Roan, PT, DPT Acute Rehabilitation Services Secure Chat Preferred Office: [phone]   Thelma Comp 01/05/2022, 11:11 AM

## 2022-01-05 NOTE — Progress Notes (Deleted)
Pt

## 2022-01-05 NOTE — Progress Notes (Signed)
PROGRESS NOTE                                                                                                                                                                                                             Patient Demographics:    Daniel [name], is a 61 y.o. male, DOB - 03-21-1961, WYO:[id number]  Outpatient Primary MD for the patient is [name], NP    LOS - 3  Admit date - 01/01/2022    Chief Complaint  Patient presents with   Shortness of Breath       Brief Narrative (HPI from H&P)   61 year old male with past medical history of Hepatitis C cirrhosis,  hepatocellular carcinoma (Dx 07/2021, follows with [name], S/P Y90 ablation 11/28/2021), iron deficiency anemia, GAVE and duodenal AVMs, ESRD (HD MWF), history of subarachnoid hemorrhage (2019), peripheral vascular disease (S/P right SFA stent), status post left BKA, diastolic congestive heart failure, pulmonary hypertension, nicotine dependence (cigarettes), gastroesophageal reflux disease who presents to [facility] emergency department with complaints of shortness of breath.  He was recently admitted for similar complaints and was diagnosed with volume overload.    Post discharge she went home and missed his last 2 dialysis treatments due to a car breakdown and transport issues, he presented again with shortness of breath was diagnosed with volume overload again.    Subjective:    Daniel [name] today has, No headache, No chest pain, No abdominal pain - No Nausea, No new weakness tingling or numbness, +ve SOB and wheezing.   Assessment  & Plan :   Acute hypoxic respiratory failure due to acute on chronic diastolic CHF caused by missed dialysis treatments and resultant volume overload with underlying pulmonary edema, bilateral pleural effusions - he has been strictly counseled on compliance with dialysis treatments, this morning he is short  of breath and wheezing, fluid removal nephrology team via HD.  Also seen by pulmonary this admission on 01/04/2022.  Alcohol withdrawal seizure.  Seen by neurology, CT head unremarkable, EEG nonacute, counseled to quit alcohol, placed on Keppra and counseled not to drive for 6 months, no focal deficits, MRI brain without contrast today.  Continue to monitor, on CIWA protocol.   Acute on chronic diastolic CHF EF 77% on last echocardiogram in February 2023.  Please see above.  ESRD (end stage renal disease) on dialysis [facility]) - Patient typically receives hemodialysis on Mondays Wednesdays and Fridays, as above missed 2 dialysis treatments due to transport issues, counseled, nephrology on board.  Essential hypertension - Resume Norvasc and add as needed hydralazine.  Hepatocellular carcinoma [facility]) - Outpatient follow-up with [name], Status post Y90 ablation 11/28/2021.  Mixed hyperlipidemia - Continue home regimen of statin therapy  GERD without esophagitis - Continuing home regimen of daily PPI therapy.  Nicotine dependence, ongoing smoking and alcohol abuse.  Counseled to quit all.  Monitor on CIWA protocol .  Hypokalemia and hypomagnesemia.  Replaced.       Condition - Fair  Family Communication  : brother Daniel [name] [phone] on 01/04/2022 at 9:18 AM message left  Code Status :  Full  Consults  :  Renal  PUD Prophylaxis : PPI   Procedures  :     CT head.  EEG brain.  Nonacute.  MRI brain.     Disposition Plan  :    Status is: Inpatient  DVT Prophylaxis  :    heparin injection 5,000 Units Start: 01/02/22 0600  Lab Results  Component Value Date   PLT 75 (L) 01/05/2022    Diet :  Diet Order             Diet renal/carb modified with fluid restriction Diet-HS Snack? Nothing; Fluid restriction: 1200 mL Fluid; Room service appropriate? Yes; Fluid consistency: Thin  Diet effective now                    Inpatient Medications  Scheduled Meds:  amLODipine  5 mg  Oral Daily   atorvastatin  40 mg Oral Daily   Chlorhexidine Gluconate Cloth  6 each Topical Q0600   gabapentin  300 mg Oral QHS   heparin  5,000 Units Subcutaneous Q8H   ipratropium-albuterol  3 mL Nebulization Q4H   lidocaine  1 patch Transdermal Q24H   melatonin  5 mg Oral QHS   multivitamin with minerals  1 tablet Oral Daily   nicotine  21 mg Transdermal Daily   pantoprazole  40 mg Oral BID AC   sodium chloride flush  3 mL Intravenous Q12H   thiamine (VITAMIN B1) injection  100 mg Intravenous Daily   traZODone  50 mg Oral QHS   Continuous Infusions: PRN Meds:.acetaminophen **OR** acetaminophen, diphenhydrAMINE, hydrALAZINE, LORazepam, ondansetron **OR** ondansetron (ZOFRAN) IV, mouth rinse, polyethylene glycol  Time Spent in minutes  30   Lala Lund M.D on 01/05/2022 at 8:56 AM  To page go to [email or web address]   [facility] -  Office  [phone]  See all Orders from today for further details    Objective:   Vitals:   01/05/22 0600 01/05/22 0700 01/05/22 0724 01/05/22 0800  BP:  (!) 128/91  112/65  Pulse: (!) 101 (!) 102  98  Resp: '17 19  19  '$ Temp:   99.2 F (37.3 C)   TempSrc:   Oral   SpO2: 97% 96%  98%  Weight:        Wt Readings from Last 3 Encounters:  01/05/22 102.6 kg  12/27/21 97.6 kg  12/24/21 108.9 kg     Intake/Output Summary (Last 24 hours) at 01/05/2022 0856 Last data filed at 01/05/2022 0032 Gross per 24 hour  Intake 49.95 ml  Output 5 ml  Net 44.95 ml     Physical Exam  Awake Alert, No new F.N deficits, Normal affect [place].AT,PERRAL Supple Neck, No JVD,  Symmetrical Chest wall movement, Good air movement bilaterally, mild wheezing RRR,No Gallops, Rubs or new Murmurs,  +ve B.Sounds, Abd Soft, No tenderness,   Left BKA, left arm AV fistula     RN pressure injury documentation: Pressure Injury 07/04/21 Buttocks Medial Stage 2 -  Partial thickness loss of dermis presenting as a shallow open injury with a red, pink wound bed  without slough. two small areas of broken skin in between buttocks. (Active)  07/04/21 0936  Location: Buttocks  Location Orientation: Medial  Staging: Stage 2 -  Partial thickness loss of dermis presenting as a shallow open injury with a red, pink wound bed without slough.  Wound Description (Comments): two small areas of broken skin in between buttocks.  Present on Admission:      Data Review:    CBC Recent Labs  Lab 01/01/22 1955 01/01/22 2018 01/02/22 0655 01/02/22 0700 01/03/22 0129 01/05/22 0114  WBC 3.9*  --  3.7*  --  3.8* 3.4*  HGB 8.6* 9.2* 9.3* 9.9* 8.5* 8.2*  HCT 24.5* 27.0* 26.2* 29.0* 24.1* 23.8*  PLT 96*  --  97*  --  90* 75*  MCV 93.9  --  94.9  --  93.4 97.5  MCH 33.0  --  33.7  --  32.9 33.6  MCHC 35.1  --  35.5  --  35.3 34.5  RDW 18.1*  --  18.1*  --  18.2* 19.1*  LYMPHSABS  --   --  0.3*  --  0.3*  --   MONOABS  --   --  0.3  --  0.7  --   EOSABS  --   --  0.0  --  0.0  --   BASOSABS  --   --  0.0  --  0.0  --     Electrolytes Recent Labs  Lab 01/01/22 1955 01/01/22 2018 01/02/22 0655 01/02/22 0700 01/03/22 0129 01/04/22 0027 01/04/22 1758 01/05/22 0114  NA 120* 120* 119* 121* 129* 132* 133*  --   K 4.4 4.6 4.0 4.3 3.6 3.1* 3.9  --   CL 85* 90* 83*  --  91* 94* 94*  --   CO2 21*  --  19*  --  '26 28 25  '$ --   GLUCOSE 91 91 137*  --  130* 149* 130*  --   BUN 44* 40* 49*  --  26* 17 21  --   CREATININE 8.68* 9.30* 8.91*  --  5.04* 3.41* 4.30*  --   CALCIUM 8.5*  --  8.5*  --  8.5* 8.2* 8.8*  --   AST 39  --  42*  --  47*  --   --   --   ALT 13  --  13  --  13  --   --   --   ALKPHOS 100  --  108  --  105  --   --   --   BILITOT 1.0  --  1.2  --  0.9  --   --   --   ALBUMIN 2.2*  --  2.4*  --  2.4*  --  2.4*  --   MG  --   --   --   --  1.6* 1.9  --  1.7  BNP  --   --  1,034.3*  --   --   --   --   --     ID Labs Recent Labs  Lab 01/01/22 1955 01/01/22 2018  01/02/22 1610 01/03/22 0129 01/04/22 0027 01/04/22 1758 01/05/22 0114   WBC 3.9*  --  3.7* 3.8*  --   --  3.4*  PLT 96*  --  97* 90*  --   --  75*  CREATININE 8.68* 9.30* 8.91* 5.04* 3.41* 4.30*  --     Radiology Reports EEG adult  Result Date: 01/04/2022 Lora Havens, MD     01/04/2022  3:39 PM Patient Name: Daniel [name] MRN: [id number] Epilepsy Attending: Lora Havens Referring Physician/Provider: Thurnell Lose, MD Date: 01/04/2022 Duration: 23.16 mins Patient history: 61yo m on eeg to evaluate for seizure Level of alertness: Awake, asleep AEDs during EEG study: GBP, Ativan Technical aspects: This EEG study was done with scalp electrodes positioned according to the 10-20 International system of electrode placement. Electrical activity was reviewed with band pass filter of 1-'70Hz'$ , sensitivity of 7 uV/mm, display speed of 9m/sec with a '60Hz'$  notched filter applied as appropriate. EEG data were recorded continuously and digitally stored.  Video monitoring was available and reviewed as appropriate. Description: The posterior dominant rhythm consists of 8-9 Hz activity of moderate voltage (25-35 uV) seen predominantly in posterior head regions, symmetric and reactive to eye opening and eye closing. Sleep was characterized by vertex waves, sleep spindles (12 to 14 Hz), maximal frontocentral region. Hyperventilation and photic stimulation were not performed.   IMPRESSION: This study is within normal limits. No seizures or epileptiform discharges were seen throughout the recording. A normal interictal EEG does not exclude nor support the diagnosis of epilepsy. Priyanka OBarbra Sarks  CT HEAD WO CONTRAST (5MM)  Result Date: 01/04/2022 CLINICAL DATA:  61year old male with altered mental status and seizure disorder. EXAM: CT HEAD WITHOUT CONTRAST TECHNIQUE: Contiguous axial images were obtained from the base of the skull through the vertex without intravenous contrast. RADIATION DOSE REDUCTION: This exam was performed according to the departmental dose-optimization program  which includes automated exposure control, adjustment of the mA and/or kV according to patient size and/or use of iterative reconstruction technique. COMPARISON:  03/24/2021 CT and prior studies FINDINGS: Brain: No evidence of acute infarction, hemorrhage, hydrocephalus, extra-axial collection or mass lesion/mass effect. Mild atrophy and chronic small-vessel white matter ischemic changes again noted. Vascular: Carotid and vertebral atherosclerotic calcifications are noted. Skull: Normal. Negative for fracture or focal lesion. Sinuses/Orbits: No acute finding. Other: None. IMPRESSION: 1. No evidence of acute intracranial abnormality. 2. Mild atrophy and chronic small-vessel white matter ischemic changes. Electronically Signed   By: JMargarette CanadaM.D.   On: 01/04/2022 12:55   DG Chest Port 1 View  Result Date: 01/04/2022 CLINICAL DATA:  Shortness of breath and cough. EXAM: PORTABLE CHEST 1 VIEW COMPARISON:  01/01/2022 FINDINGS: Lungs are adequately inflated and demonstrate persistent hazy airspace opacification over the left midlung and right mid to lower lung which may be due to edema or infection. Stable small right pleural effusion likely with associated basilar atelectasis. Cardiomediastinal silhouette and remainder of the exam is unchanged. IMPRESSION: Persistent hazy airspace process over the left midlung and right mid to lower lung which may be due to edema or infection. Stable small right pleural effusion likely with associated basilar atelectasis. Electronically Signed   By: DMarin OlpM.D.   On: 01/04/2022 10:00   DG Chest Port 1 View  Result Date: 01/04/2022 CLINICAL DATA:  Shortness of breath during dialysis EXAM: PORTABLE CHEST 1 VIEW COMPARISON:  Earlier today FINDINGS: Bilateral pulmonary airspace opacity and small right pleural effusion, unchanged. Cardiomegaly. IMPRESSION:  Unchanged pulmonary infiltrates and small right pleural effusion. Electronically Signed   By: Jorje Guild M.D.   On:  01/04/2022 09:29   DG Chest 2 View  Result Date: 01/01/2022 CLINICAL DATA:  Shortness of breath.  Missed dialysis. EXAM: CHEST - 2 VIEW COMPARISON:  Radiograph 12/26/2021, CT 12/25/2021 FINDINGS: Chronic cardiomegaly. Unchanged mediastinal contours with aortic atherosclerosis and tortuosity. Right greater than left pleural effusions with slight increase. Patchy airspace disease in the left mid lung persists but has decreased in density. Patchy airspace disease at the right lung base has worsened. Vascular congestion. No pneumothorax. Left axillary vascular stent. IMPRESSION: 1. Increased pleural effusions.  Vascular congestion. 2. Shifting airspace disease. Improvement in left perihilar opacities, patchy airspace disease at the right lung base has worsened. Electronically Signed   By: Keith Rake M.D.   On: 01/01/2022 20:38

## 2022-01-05 NOTE — Progress Notes (Signed)
Pt requested this writer to contact respiratory to place cpap, pt removed as soon as placed stated " its getting in the way of my game (football)" pt now sleeping

## 2022-01-05 NOTE — Progress Notes (Signed)
[name] Progress Note   Subjective:    Seen and examined patient at bedside in ICU. He looks much better. Now on RA and blood pressures stable. Head CT and EEG unremarkable. He tolerated HD overnight with net UF 4L. Noted low Mg and Phos this AM which we will replete. Spoke to ICU RN: scheduled for transfer to progressive today and for possible dc tomorrow. Will ensure he is dialyzed before he leaves. MRI brain scheduled for today.  Objective Vitals:   01/05/22 0600 01/05/22 0700 01/05/22 0724 01/05/22 0800  BP:  (!) 128/91  112/65  Pulse: (!) 101 (!) 102  98  Resp: '17 19  19  '$ Temp:   99.2 F (37.3 C)   TempSrc:   Oral   SpO2: 97% 96%  98%  Weight:       Physical Exam General: Chronically ill-appearing; on RA; NAD Neuro: A&O X 3; No focal deficits noted Heart: S1 and S2; No murmurs, gallops, or rubs Lungs: Mild expiratory wheezing RUL; Diminished bilateral lower lobes Abdomen: Soft and non-tender Extremities: L BKA; no edema RLE Dialysis Access: L AVF (+) B/T   Filed Weights   01/03/22 1445 01/03/22 1849 01/05/22 0400  Weight: 106.3 kg 102.6 kg 102.6 kg    Intake/Output Summary (Last 24 hours) at 01/05/2022 0932 Last data filed at 01/05/2022 0032 Gross per 24 hour  Intake 49.95 ml  Output 5 ml  Net 44.95 ml    Additional Objective Labs: Basic Metabolic Panel: Recent Labs  Lab 01/04/22 0027 01/04/22 1758 01/05/22 0114 01/05/22 0743  NA 132* 133*  --  130*  K 3.1* 3.9  --  3.8  CL 94* 94*  --  94*  CO2 28 25  --  27  GLUCOSE 149* 130*  --  137*  BUN 17 21  --  12  CREATININE 3.41* 4.30*  --  3.13*  CALCIUM 8.2* 8.8*  --  8.2*  PHOS  --  2.5 1.5* 1.4*   Liver Function Tests: Recent Labs  Lab 01/01/22 1955 01/02/22 0655 01/03/22 0129 01/04/22 1758 01/05/22 0743  AST 39 42* 47*  --   --   ALT '13 13 13  '$ --   --   ALKPHOS 100 108 105  --   --   BILITOT 1.0 1.2 0.9  --   --   PROT 7.4 8.2* 8.0  --   --   ALBUMIN 2.2* 2.4* 2.4* 2.4*  2.2*   No results for input(s): "LIPASE", "AMYLASE" in the last 168 hours. CBC: Recent Labs  Lab 01/01/22 1955 01/01/22 2018 01/02/22 0655 01/02/22 0700 01/03/22 0129 01/05/22 0114  WBC 3.9*  --  3.7*  --  3.8* 3.4*  NEUTROABS  --   --  3.0  --  2.8  --   HGB 8.6*   < > 9.3* 9.9* 8.5* 8.2*  HCT 24.5*   < > 26.2* 29.0* 24.1* 23.8*  MCV 93.9  --  94.9  --  93.4 97.5  PLT 96*  --  97*  --  90* 75*   < > = values in this interval not displayed.   Blood Culture    Component Value Date/Time   SDES BRONCHIAL ALVEOLAR LAVAGE 12/26/2021 1546   SDES BRONCHIAL ALVEOLAR LAVAGE 12/26/2021 1546   SDES BRONCHIAL ALVEOLAR LAVAGE 12/26/2021 1546   SPECREQUEST NONE 12/26/2021 1546   SPECREQUEST NONE 12/26/2021 1546   SPECREQUEST NONE 12/26/2021 1546   CULT  12/26/2021 1546    NO  GROWTH 2 DAYS Performed at [facility], [name] [address], [place], [place] [address]    CULT  12/26/2021 1546    NO GROWTH 2 DAYS Performed at [facility], [place] [address], [place], [place] [address]    CULT  12/26/2021 1546    No growth aerobically or anaerobically. Performed at [facility], [place] [address], [place], [place] [address]    REPTSTATUS 12/29/2021 FINAL 12/26/2021 1546   REPTSTATUS 12/29/2021 FINAL 12/26/2021 1546   REPTSTATUS 01/01/2022 FINAL 12/26/2021 1546    Cardiac Enzymes: No results for input(s): "CKTOTAL", "CKMB", "CKMBINDEX", "TROPONINI" in the last 168 hours. CBG: Recent Labs  Lab 01/04/22 1048 01/04/22 1910 01/04/22 2305 01/05/22 0313 01/05/22 0723  GLUCAP 81 142* 105* 139* 126*   Iron Studies: No results for input(s): "IRON", "TIBC", "TRANSFERRIN", "FERRITIN" in the last 72 hours. Lab Results  Component Value Date   INR 1.1 12/10/2021   INR 1.1 12/09/2021   INR 1.0 11/28/2021   Studies/Results: DG CHEST PORT 1 VIEW  Result Date: 01/05/2022 CLINICAL DATA:  Shortness of breath. EXAM: PORTABLE CHEST 1 VIEW COMPARISON:  01/04/2022 and prior studies  FINDINGS: Cardiomegaly with pulmonary vascular congestion again noted. Bilateral airspace opacities and small RIGHT pleural effusion are not significantly changed. There is no evidence of pneumothorax. LEFT axillary vascular stent is again noted. IMPRESSION: Unchanged appearance of the chest with cardiomegaly, pulmonary vascular congestion, bilateral airspace opacities and small RIGHT pleural effusion. Electronically Signed   By: Margarette Canada M.D.   On: 01/05/2022 09:05   EEG adult  Result Date: 01/04/2022 Lora Havens, MD     01/04/2022  3:39 PM Patient Name: Daniel [name] MRN: [id number] Epilepsy Attending: Lora Havens Referring Physician/Provider: Thurnell Lose, MD Date: 01/04/2022 Duration: 23.16 mins Patient history: 61yo m on eeg to evaluate for seizure Level of alertness: Awake, asleep AEDs during EEG study: GBP, Ativan Technical aspects: This EEG study was done with scalp electrodes positioned according to the 10-20 International system of electrode placement. Electrical activity was reviewed with band pass filter of 1-'70Hz'$ , sensitivity of 7 uV/mm, display speed of 68m/sec with a '60Hz'$  notched filter applied as appropriate. EEG data were recorded continuously and digitally stored.  Video monitoring was available and reviewed as appropriate. Description: The posterior dominant rhythm consists of 8-9 Hz activity of moderate voltage (25-35 uV) seen predominantly in posterior head regions, symmetric and reactive to eye opening and eye closing. Sleep was characterized by vertex waves, sleep spindles (12 to 14 Hz), maximal frontocentral region. Hyperventilation and photic stimulation were not performed.   IMPRESSION: This study is within normal limits. No seizures or epileptiform discharges were seen throughout the recording. A normal interictal EEG does not exclude nor support the diagnosis of epilepsy. Priyanka OBarbra Sarks  CT HEAD WO CONTRAST (5MM)  Result Date: 01/04/2022 CLINICAL DATA:   61year old male with altered mental status and seizure disorder. EXAM: CT HEAD WITHOUT CONTRAST TECHNIQUE: Contiguous axial images were obtained from the base of the skull through the vertex without intravenous contrast. RADIATION DOSE REDUCTION: This exam was performed according to the departmental dose-optimization program which includes automated exposure control, adjustment of the mA and/or kV according to patient size and/or use of iterative reconstruction technique. COMPARISON:  03/24/2021 CT and prior studies FINDINGS: Brain: No evidence of acute infarction, hemorrhage, hydrocephalus, extra-axial collection or mass lesion/mass effect. Mild atrophy and chronic small-vessel white matter ischemic changes again noted. Vascular: Carotid and vertebral atherosclerotic  calcifications are noted. Skull: Normal. Negative for fracture or focal lesion. Sinuses/Orbits: No acute finding. Other: None. IMPRESSION: 1. No evidence of acute intracranial abnormality. 2. Mild atrophy and chronic small-vessel white matter ischemic changes. Electronically Signed   By: Margarette Canada M.D.   On: 01/04/2022 12:55   DG Chest Port 1 View  Result Date: 01/04/2022 CLINICAL DATA:  Shortness of breath and cough. EXAM: PORTABLE CHEST 1 VIEW COMPARISON:  01/01/2022 FINDINGS: Lungs are adequately inflated and demonstrate persistent hazy airspace opacification over the left midlung and right mid to lower lung which may be due to edema or infection. Stable small right pleural effusion likely with associated basilar atelectasis. Cardiomediastinal silhouette and remainder of the exam is unchanged. IMPRESSION: Persistent hazy airspace process over the left midlung and right mid to lower lung which may be due to edema or infection. Stable small right pleural effusion likely with associated basilar atelectasis. Electronically Signed   By: Marin Olp M.D.   On: 01/04/2022 10:00   DG Chest Port 1 View  Result Date: 01/04/2022 CLINICAL DATA:   Shortness of breath during dialysis EXAM: PORTABLE CHEST 1 VIEW COMPARISON:  Earlier today FINDINGS: Bilateral pulmonary airspace opacity and small right pleural effusion, unchanged. Cardiomegaly. IMPRESSION: Unchanged pulmonary infiltrates and small right pleural effusion. Electronically Signed   By: Jorje Guild M.D.   On: 01/04/2022 09:29    Medications:  magnesium sulfate bolus IVPB     sodium phosphate 15 mmol in dextrose 5 % 250 mL infusion      amLODipine  5 mg Oral Daily   atorvastatin  40 mg Oral Daily   Chlorhexidine Gluconate Cloth  6 each Topical Q0600   gabapentin  300 mg Oral QHS   heparin  5,000 Units Subcutaneous Q8H   ipratropium-albuterol  3 mL Nebulization Q4H   lidocaine  1 patch Transdermal Q24H   melatonin  5 mg Oral QHS   multivitamin with minerals  1 tablet Oral Daily   nicotine  21 mg Transdermal Daily   pantoprazole  40 mg Oral BID AC   sodium chloride flush  3 mL Intravenous Q12H   thiamine (VITAMIN B1) injection  100 mg Intravenous Daily   traZODone  50 mg Oral QHS    Dialysis Orders: MWF [facility] 4h  450/ 500   101kg   2/2 bath  Hep none  LUA AVF  hep B labs 8/14 - last HD 8/28 outpt, post 103.7kg, coming in 8- 12kg over x 2 wks - last Hb 8.0 on 8/21  Assessment/Plan: Seizure-like activity/Poss ETOH withdrawal seizure: Neuro consulted. His mentation is now back to baseline. Head CT and EEG (-). MRI brain for today. Cont CIWA protocol. Acute on chronic hypoxic respiratory failure/chronic diastolic CHF: Removed 1.6X with HD over past 2 days. Tolerated HD overnight and additional 4L was removed. Patient weaned down to RA. Repeat CXR this AM shows unchanged cardiomegaly, vascular congestion, and bl opacities.  ESRD - on HD MWF. Having issues with making his OP HD txs d/t transportation issues. Received HD overnight (see above). Next HD 9/11. Push UF as tolerated. Hyponatremia - likely from vol overload, rx w/ HD for volume removal, and fluid restriction.  Current Na 130 ETOH abuse - per primary-cont CIWA HTN/ vol - BP's improved. Continue current regimen H/o [place] - f/b oncology  Tobie Poet, NP [facility] 01/05/2022,9:32 AM  LOS: 3 days

## 2022-01-05 NOTE — Progress Notes (Signed)
Plan of care reviewed with Dr. Candiss Norse.  PCCM will be available PRN.      Noe Gens, MSN, APRN, NP-C, AGACNP-BC [facility] 01/05/2022, 10:06 AM   Please see [email or web address] for pager details.   From 7A-7P if no response, please call [phone] After hours, please call ELink [phone]

## 2022-01-05 NOTE — Progress Notes (Signed)
   01/05/22 0032  Oxygen Therapy  O2 Device Room Air  Patient Activity (if Appropriate) In bed  Post Treatment  Dialyzer Clearance Lightly streaked  Duration of HD Treatment -hour(s) 3.5 hour(s)  Liters Processed 84  Fluid Removed 4  Tolerated HD Treatment Yes  AVG/AVF Arterial Site Held (minutes) 15 minutes  AVG/AVF Venous Site Held (minutes) 15 minutes   TX fin. W/o difficulty.

## 2022-01-06 DIAGNOSIS — E8770 Fluid overload, unspecified: Secondary | ICD-10-CM | POA: Diagnosis not present

## 2022-01-06 DIAGNOSIS — I5033 Acute on chronic diastolic (congestive) heart failure: Secondary | ICD-10-CM | POA: Diagnosis not present

## 2022-01-06 DIAGNOSIS — F1010 Alcohol abuse, uncomplicated: Secondary | ICD-10-CM | POA: Diagnosis not present

## 2022-01-06 DIAGNOSIS — J810 Acute pulmonary edema: Secondary | ICD-10-CM | POA: Diagnosis not present

## 2022-01-06 LAB — RENAL FUNCTION PANEL
Albumin: 2.1 g/dL — ABNORMAL LOW (ref 3.5–5.0)
Albumin: 2.2 g/dL — ABNORMAL LOW (ref 3.5–5.0)
Anion gap: 10 (ref 5–15)
Anion gap: 8 (ref 5–15)
BUN: 21 mg/dL (ref 8–23)
BUN: 12 mg/dL (ref 8–23)
CO2: 26 mmol/L (ref 22–32)
CO2: 29 mmol/L (ref 22–32)
Calcium: 8.4 mg/dL — ABNORMAL LOW (ref 8.9–10.3)
Calcium: 8.7 mg/dL — ABNORMAL LOW (ref 8.9–10.3)
Chloride: 93 mmol/L — ABNORMAL LOW (ref 98–111)
Chloride: 96 mmol/L — ABNORMAL LOW (ref 98–111)
Creatinine, Ser: 4.75 mg/dL — ABNORMAL HIGH (ref 0.61–1.24)
Creatinine, Ser: 3.38 mg/dL — ABNORMAL HIGH (ref 0.61–1.24)
GFR, Estimated: 13 mL/min — ABNORMAL LOW (ref >60)
GFR, Estimated: 20 mL/min — ABNORMAL LOW (ref >60)
Glucose, Bld: 94 mg/dL (ref 70–99)
Glucose, Bld: 109 mg/dL — ABNORMAL HIGH (ref 70–99)
Phosphorus: 3.7 mg/dL (ref 2.5–4.6)
Phosphorus: 2 mg/dL — ABNORMAL LOW (ref 2.5–4.6)
Potassium: 3.9 mmol/L (ref 3.5–5.1)
Potassium: 3.9 mmol/L (ref 3.5–5.1)
Sodium: 129 mmol/L — ABNORMAL LOW (ref 135–145)
Sodium: 133 mmol/L — ABNORMAL LOW (ref 135–145)

## 2022-01-06 LAB — CBC
HCT: 25.3 % — ABNORMAL LOW (ref 39.0–52.0)
Hemoglobin: 8.2 g/dL — ABNORMAL LOW (ref 13.0–17.0)
MCH: 32.8 pg (ref 26.0–34.0)
MCHC: 32.4 g/dL (ref 30.0–36.0)
MCV: 101.2 fL — ABNORMAL HIGH (ref 80.0–100.0)
Platelets: 84 10*3/uL — ABNORMAL LOW (ref 150–400)
RBC: 2.5 MIL/uL — ABNORMAL LOW (ref 4.22–5.81)
RDW: 18.8 % — ABNORMAL HIGH (ref 11.5–15.5)
WBC: 3.1 10*3/uL — ABNORMAL LOW (ref 4.0–10.5)
nRBC: 0 % (ref 0.0–0.2)

## 2022-01-06 LAB — HEPATITIS B CORE ANTIBODY, IGM: Hep B C IgM: NONREACTIVE

## 2022-01-06 LAB — HEPATITIS B SURFACE ANTIGEN: Hepatitis B Surface Ag: NONREACTIVE

## 2022-01-06 LAB — HEPATITIS B SURFACE ANTIBODY,QUALITATIVE: Hep B S Ab: REACTIVE — AB

## 2022-01-06 MED ORDER — AMLODIPINE BESYLATE 10 MG PO TABS
10.0000 mg | ORAL_TABLET | Freq: Every day | ORAL | Status: DC
  Administered 2022-01-07: 10 mg via ORAL
  Filled 2022-01-06: qty 1

## 2022-01-06 MED ORDER — IPRATROPIUM-ALBUTEROL 0.5-2.5 (3) MG/3ML IN SOLN
3.0000 mL | Freq: Two times a day (BID) | RESPIRATORY_TRACT | Status: DC
Start: 2022-01-06 — End: 2022-01-07
  Administered 2022-01-06: 3 mL via RESPIRATORY_TRACT
  Filled 2022-01-06: qty 3

## 2022-01-06 MED ORDER — HYDRALAZINE HCL 20 MG/ML IJ SOLN: 10.0000 mg | Freq: Four times a day (QID) | INTRAMUSCULAR | Status: DC | PRN

## 2022-01-06 NOTE — Progress Notes (Signed)
Pt refused 0000 and 0400 duoneb tx stating he is sleeping and doesn't need it.

## 2022-01-06 NOTE — Procedures (Signed)
I was present at this dialysis session. I have reviewed the session itself and made appropriate changes.   On [employer] today using AVF.  Awake, alert, interactive.  RA nl SPO2.  3.5L UF goal.   Filed Weights   01/03/22 1445 01/03/22 1849 01/05/22 0400  Weight: 106.3 kg 102.6 kg 102.6 kg    Recent Labs  Lab 01/05/22 1950  NA 132*  K 4.0  CL 95*  CO2 27  GLUCOSE 133*  BUN 15  CREATININE 4.00*  CALCIUM 8.3*  PHOS 2.4*    Recent Labs  Lab 01/02/22 0655 01/02/22 0700 01/03/22 0129 01/05/22 0114  WBC 3.7*  --  3.8* 3.4*  NEUTROABS 3.0  --  2.8  --   HGB 9.3* 9.9* 8.5* 8.2*  HCT 26.2* 29.0* 24.1* 23.8*  MCV 94.9  --  93.4 97.5  PLT 97*  --  90* 75*    Scheduled Meds:  amLODipine  5 mg Oral Daily   atorvastatin  40 mg Oral Daily   Chlorhexidine Gluconate Cloth  6 each Topical Q0600   gabapentin  300 mg Oral QHS   heparin  5,000 Units Subcutaneous Q8H   ipratropium-albuterol  3 mL Nebulization Q4H   lidocaine  1 patch Transdermal Q24H   melatonin  5 mg Oral QHS   multivitamin with minerals  1 tablet Oral Daily   nicotine  21 mg Transdermal Daily   pantoprazole  40 mg Oral BID AC   sodium chloride flush  3 mL Intravenous Q12H   thiamine (VITAMIN B1) injection  100 mg Intravenous Daily   traZODone  50 mg Oral QHS   Continuous Infusions: PRN Meds:.acetaminophen **OR** acetaminophen, alteplase, diphenhydrAMINE, heparin, hydrALAZINE, lidocaine (PF), lidocaine-prilocaine, LORazepam **OR** LORazepam, LORazepam, ondansetron **OR** ondansetron (ZOFRAN) IV, mouth rinse, pentafluoroprop-tetrafluoroeth, polyethylene glycol   Pearson Grippe  MD 01/06/2022, 8:32 AM

## 2022-01-06 NOTE — Progress Notes (Addendum)
Received patient in bed to unit.  Alert and oriented.  Informed consent signed and in chart.   Treatment initiated: 0810 Treatment completed: 1148  Patient tolerated well.  Transported back to the room  Alert, without acute distress.  Hand-off given to patient's nurse.   Access used: fistula left arm  Access issues: none  Total UF removed: 3.5 liters Medication(s) given: none Post HD VS: 153/78 MAP 94 HR 89 RR 15 Sat 96% on room air Temp oral 98.0 Post HD weight: 98.2 kg bed wt   [facility]

## 2022-01-06 NOTE — Plan of Care (Signed)

## 2022-01-06 NOTE — Progress Notes (Signed)
Contacted by nephrologist regarding possible additional options for transportation to HD. Contacted TOC staff via secure chat to request that a [facility] staff member please provide any additional transportation resources to pt. Will update clinic on pt's d/c date once known.   Melven Sartorius Renal Navigator [phone]

## 2022-01-06 NOTE — Progress Notes (Signed)
OT Cancellation Note  Patient Details Name: Daniel [name] MRN: [id number] DOB: 05-23-1960   Cancelled Treatment:    Reason Eval/Treat Not Completed: Patient at procedure or test/ unavailable (HD. WIll return as schedule allows. Thank you.)  [place], OTR/L Acute Rehab Office: [phone] 01/06/2022, 8:32 AM

## 2022-01-06 NOTE — Progress Notes (Addendum)
Neurology Progress Note  Brief HPI: Patient with history of ICH, daily ETOH use (about 40 oz beer daily), hepatitis C cirrhosis, hepatocellular carcinoma s/p Y90 ablation, GAVE, iron deficiency anemia and ESRD on IHD (missed two treatments due to transportation issues), PVD, CHF, pulmonary HTN, left BKA, chronic hyponatremia and HLD was initially admitted with volume overload after missing two dialysis treatments due to transportation issues.  While in the hospital, he had a seizure event where he developed shaking of bilateral upper extremities with eyes rolled up in the back of his head lasting about two minutes.    Subjective: Patient reports that he was told he had had a seizure but does not recall it at all.  Exam: Vitals:   01/06/22 0830 01/06/22 0910  BP: (!) 184/60 (!) 159/56  Pulse: 88   Resp: 18   Temp:    SpO2: 98%    Gen: In bed, receiving HD treatment, NAD Resp: non-labored breathing, no acute distress   NEURO:  Mental Status: AA&Ox3  Speech/Language: speech is without dysarthria or aphasia.  Fluency, and comprehension intact.  Cranial Nerves:  II: PERRL.  III, IV, VI: EOMI. Eyelids elevate symmetrically.  V: Sensation is intact to light touch and symmetrical to face.  VII: Smile is symmetrical.   VIII: hearing intact to voice. IX, X: Phonation is normal.  EX:HBZJIRCV shrug 5/5. XII: tongue is midline without fasciculations. Motor: 5/5 strength to all muscle groups tested.  Tone: is normal and bulk is normal Sensation- Intact to light touch bilaterally.  Coordination: FTN intact on right, unable to test on left due to HD access Gait- deferred   Pertinent Labs: CBC    Component Value Date/Time   WBC 3.1 (L) 01/06/2022 0741   RBC 2.50 (L) 01/06/2022 0741   HGB 8.2 (L) 01/06/2022 0741   HGB 9.0 (L) 10/03/2021 0900   HGB 8.4 (L) 08/22/2020 1141   HCT 25.3 (L) 01/06/2022 0741   HCT 25.0 (L) 08/22/2020 1141   PLT 84 (L) 01/06/2022 0741   PLT 142 (L)  10/03/2021 0900   PLT 184 08/22/2020 1141   MCV 101.2 (H) 01/06/2022 0741   MCV 94 08/22/2020 1141   MCH 32.8 01/06/2022 0741   MCHC 32.4 01/06/2022 0741   RDW 18.8 (H) 01/06/2022 0741   RDW 16.7 (H) 08/22/2020 1141   LYMPHSABS 0.3 (L) 01/03/2022 0129   LYMPHSABS 1.8 08/22/2020 1141   MONOABS 0.7 01/03/2022 0129   EOSABS 0.0 01/03/2022 0129   EOSABS 0.3 08/22/2020 1141   BASOSABS 0.0 01/03/2022 0129   BASOSABS 0.0 08/22/2020 1141       Latest Ref Rng & Units 01/06/2022    7:41 AM 01/05/2022    7:50 PM 01/05/2022    7:43 AM  BMP  Glucose 70 - 99 mg/dL 94  133  137   BUN 8 - 23 mg/dL '21  15  12   '$ Creatinine 0.61 - 1.24 mg/dL 4.75  4.00  3.13   Sodium 135 - 145 mmol/L 129  132  130   Potassium 3.5 - 5.1 mmol/L 3.9  4.0  3.8   Chloride 98 - 111 mmol/L 93  95  94   CO2 22 - 32 mmol/L '26  27  27   '$ Calcium 8.9 - 10.3 mg/dL 8.4  8.3  8.2      Imaging Reviewed:  CT head:  No acute abnormality, atrophy and chronic small vessel white matter ischemic disease  MRI brain: Motion limited, no acute intracranial process  1. Evaluation is somewhat limited by motion artifact. Within this limitation, no acute intracranial process. No evidence of acute or subacute infarct. No definite seizure etiology identified.  2. Redemonstrated sequela of prior hemorrhages, as described above, with 2 new foci of microhemorrhage in the left frontal lobe and right temporal lobe since 10/28/2020.  Assessment: 61 year old patient with history of ICH, ETOH abuse, hepatitis C cirrhosis, hepatocellular carcinoma, anemia, GAVE, ESRD on IHD, PVD, left BKA, CHF, pulmonary HTN, tobacco abuse, HLD and chronic hyponatremia was admitted for volume overload after missing two HD treatments and had a seizure lasting about two minutes with shaking of bilateral upper extremities.  Patient has no memory of the event.  Given context of hospitalization and patient's statement that he drinks about 40oz of beer daily at home, seizure  was likely caused by ETOH withdrawal, no seizure focus on EEG or clear seizure focus MRI.  Overall his microhemorrhage pattern on MRI is consistent with uncontrolled hypertension, though he does have some spots concerning for cortical microbleeds suggesting potential developing CAA  Additionally had an extended discussion with patient about how beer does contain alcohol and counts as drinking and that he needs to be abstinent to avoid further seizures so that he can retain his independence and ability to drive legally  Impression: seizure in patient with ETOH abuse, likely withdrawal  Recommendations: - Continue CIWA protocol - Continue folate and thiamine - UA, UDS if possible - Driving restrictions discussed with patient, he reported he would not reach out to the [facility] - Neurology will be available on an as-needed basis going forward, please reach out if any new questions or concerns  Seizure precautions to include in discharge instructions:  Standard seizure precautions: Per [facility] statutes, patients with seizures are not allowed to drive until  they have been seizure-free for six months. Use caution when using heavy equipment or power tools. Avoid working on ladders or at heights. Take showers instead of baths. Ensure the water temperature is not too high on the home water heater. Do not go swimming alone. When caring for infants or small children, sit down when holding, feeding, or changing them to minimize risk of injury to the child in the event you have a seizure.  To reduce risk of seizures, maintain good sleep hygiene avoid alcohol and illicit drug use, take all anti-seizure medications as prescribed.    [place] , MSN, AGACNP-BC [facility] See Amion for schedule and pager information 01/06/2022 11:39 AM  Attending Neurologist's note:  I personally saw this patient, gathering history, performing a full neurologic examination, reviewing relevant  labs, personally reviewing relevant imaging including MRI brain, and formulated the assessment and plan, adding the note above for completeness and clarity to accurately reflect my thoughts  Lesleigh Noe MD-PhD [facility] [phone] Available 7 AM to 7 PM, outside these hours please contact Neurologist on call listed on AMION   Greater than 50 minutes were spent in care of this patient today, the majority at bedside in discussion of seizure precautions, need for alcohol cessation etc.

## 2022-01-06 NOTE — Consult Note (Signed)
   [facility] CM Inpatient Consult   01/06/2022  Daniel [name] Oct 30, 1960 [id number]  [place] [ACO] Patient: Medicare ACO REACH  Primary Care Provider:  [name], NP, [facility]  Patient is currently active with [facility] for chronic disease management services.    Met with the patient at the bedside to elicit ongoing follow up and participation with the [facility] NP-G. Patient continues to deny transportation needs as noted in documentation to this Probation officer.  Plan: Continue to follow progress with Inpatient Transition Of Care [TOC] team member to make aware that [facility] following.  Send updates to [facility] NP-G, as appropriate for follow up community needs.  Of note, [facility] Care Management services does not replace or interfere with any services that are needed or arranged by inpatient [facility] care management team.  For additional questions or referrals please contact:  Natividad Brood, RN BSN [place]  [phone] business mobile phone Toll free office [phone]  *[place]  [phone] Fax number: [phone] [email or web address]

## 2022-01-06 NOTE — TOC Initial Note (Addendum)
Transition of Care [facility]) - Initial/Assessment Note    Patient Details  Name: Daniel [name] MRN: [id number] Date of Birth: Aug 27, 1960  Transition of Care [facility]) CM/SW Contact:    Bartholomew Crews, RN Phone Number: [phone] 01/06/2022, 3:29 PM  Clinical Narrative:                  Spoke with patient at the bedside, reviewed chart, and discussed patient situation with renal navigator.   Patient currently receives outpatient HD at [facility] - this center is not the closest HD center to him, but in April 2021, [facility] did not have any available seats.   Patient was initially set up with [facility] for dialysis transportation d/t not having insurance initially, however, patient stated at the time he would likely drive himself. Patient now has Medicare and Medicaid, and should qualify for Medicaid transportation. Patient advised to call Medicaid transportation at [place], however, patient insistent that he doesn't need help with transportation. He can drive himself and currently his care is in the parking lot. However, patient continues to insist that he can drive although the cost of gas makes it difficult to keep his gas tank full. Discussed calling DSS about gas cards.   Patient had his left leg amputated in 12/22, now patient has a prosthesis, but is still learning to walk on it. Patient stated that he is to get an electric wheelchair "any day now" which will help his mobility. Patient is currently active with [facility] for transportation, however, the bus driver cannot assist him out of the house d/t the wheelchair ramp is at back of the house and the driver cannot lose sight of the van at any time. Patient states that if he had his electric wheelchair that he can get himself to front of house on his own.  Spoke with Delsa Sale at [facility] - patient is active. Will need HH orders for RN, PT, and SW for resumption of services. Demographics and PCP confirmed. TOC following for transition needs.   Expected  Discharge Plan: Home/Self Care Barriers to Discharge: Continued Medical Work up   Patient Goals and CMS Choice Patient states their goals for this hospitalization and ongoing recovery are:: return home CMS Medicare.gov Compare Post Acute Care list provided to:: Patient Choice offered to / list presented to : Patient  Expected Discharge Plan and Services Expected Discharge Plan: Home/Self Care   Discharge Planning Services: CM Consult Post Acute Care Choice: NA Living arrangements for the past 2 months: Boarding House                 DME Arranged: N/A DME Agency: NA       HH Arranged: NA [place] Agency: NA        Prior Living Arrangements/Services Living arrangements for the past 2 months: [place] with:: Self Patient language and need for interpreter reviewed:: Yes Do you feel safe going back to the place where you live?: Yes      Need for Family Participation in Patient Care: No (Comment)   Current home services: DME (wheelchair, prosthetic leg, has electric wheelchair pending any day now) Criminal Activity/Legal Involvement Pertinent to Current Situation/Hospitalization: No - Comment as needed  Activities of Daily Living      Permission Sought/Granted                  Emotional Assessment Appearance:: Appears stated age Attitude/Demeanor/Rapport: Engaged Affect (typically observed): Accepting Orientation: : Oriented to Self, Oriented  to Place, Oriented to  Time, Oriented to Situation Alcohol / Substance Use: Not Applicable Psych Involvement: No (comment)  Admission diagnosis:  Hyponatremia [E87.1] Acute pulmonary edema (HCC) [J81.0] Acute bronchospasm [J98.01] ESRD on dialysis ([place]) [N18.6, Z99.2] Volume overload [E87.70] Community acquired pneumonia of right lower lobe of lung [J18.9] Patient Active Problem List   Diagnosis Date Noted   Acute pulmonary edema ([place]) 01/02/2022   Bilateral pleural effusion 01/02/2022   Volume overload  12/25/2021   Acute on chronic anemia    ESRD on dialysis ([place])    Hyponatremia 12/09/2021   Mixed hyperlipidemia 11/13/2021   Musculoskeletal neck pain 11/13/2021   Acute blood loss anemia 11/12/2021   Iron deficiency anemia due to chronic blood loss 10/17/2021   Euthyroid sick syndrome 10/10/2021   Elevated troponin 10/10/2021   Recurrent pneumonia 10/08/2021   LLQ abdominal pain 10/08/2021   Physical deconditioning 10/08/2021   Cognitive decline 10/08/2021   Left flank pain 10/07/2021   Intestinal polyps 10/06/2021   Portal hypertensive gastropathy ([place]) 10/06/2021   Rotator cuff syndrome 10/05/2021   Epistaxis 10/05/2021   Hx of BKA, left ([place]) 10/05/2021   Recurrent gastrointestinal hemorrhage 09/28/2021   Edema of amputation stump of left lower extremity ([place]) 09/28/2021   Chronic pain disorder 09/28/2021   GI bleed 08/23/2021   Amputation stump infection ([place])    Sepsis ([place]) 08/16/2021   Right foot pain 08/07/2021   Right lower lobe lung mass 08/06/2021   Hepatocellular carcinoma ([place]) 08/06/2021   Melena    Pressure injury of skin 07/05/2021   Right leg pain 07/04/2021   GI bleeding 07/04/2021   Acute on chronic diastolic CHF (congestive heart failure) ([place]) 06/14/2021   GERD without esophagitis 06/14/2021   Chest pain 06/13/2021   Diabetic foot infection ([place]) 04/14/2021   PAD (peripheral artery disease) ([place]) 04/08/2021   Gastritis and gastroduodenitis    ESRD (end stage renal disease) on dialysis ([place]) 03/25/2021   Diabetes mellitus type 2, controlled, with complications ([place]) 40/11/6759   Alcohol abuse with intoxication ([place]) 03/25/2021   Cocaine abuse ([place]) 03/25/2021   Tobacco use disorder 03/25/2021   Class 2 obesity due to excess calories with body mass index (BMI) of 39.0 to 39.9 in adult 03/25/2021   AVM (arteriovenous malformation) of duodenum, acquired    Peptic ulcer disease    Upper GI bleed 03/24/2021   Chalazion of right upper eyelid 03/24/2021    Thrombocytopenia ([place]) 03/24/2021   Visual hallucination 03/24/2021   Bilateral leg edema 03/24/2021   Benign neoplasm of duodenum, jejunum, and ileum    Anemia due to chronic kidney disease 02/23/2021   Rotator cuff tear arthropathy of right shoulder 01/23/2021   Alcohol dependence ([place]) 01/13/2021   Gastric ulcer with hemorrhage 01/13/2021   Generalized weakness    Transaminitis 01/10/2021   Alcohol abuse 10/27/2020   History of cocaine abuse ([place]) 10/27/2020   Nicotine dependence, cigarettes, uncomplicated 95/12/3265   Acute GI bleeding 08/10/2020   Duodenal ulcer with hemorrhage    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 [place] [address] Phone: [phone] Fax: [phone]     Social Determinants of Health (SDOH) Interventions    Readmission Risk Interventions    10/10/2021   10:49 AM 02/27/2021   12:51 PM 07/07/2019    4:03 PM  Readmission Risk Prevention Plan  Transportation Screening Complete Complete Complete  PCP or Specialist Appt within 3-5 Days   Complete  HRI or [place]   Complete  Social Work Consult for [place] Planning/Counseling   Complete  Palliative Care Screening   Not Applicable  Medication Review Press photographer) Complete Complete Complete  PCP or Specialist appointment within 3-5 days of discharge Complete  Complete   HRI or [place] Complete Complete   SW Recovery Care/Counseling Consult Complete Complete   Palliative Care Screening Complete Not [place] Patient Refused Not Applicable

## 2022-01-06 NOTE — Progress Notes (Signed)
9/11 Patient out of the room for dialysis, spoke to patient's sister Verdene Lennert) and rcv'd verbal consent. IM Letter was explained.

## 2022-01-06 NOTE — Care Management Important Message (Signed)
Important Message  Patient Details  Name: ANFERNEE PESCHKE MRN: [id number] Date of Birth: 02-13-1961   Medicare Important Message Given:  Other (see comment)     Hannah Beat 01/06/2022, 11:51 AM

## 2022-01-06 NOTE — Progress Notes (Signed)
PROGRESS NOTE                                                                                                                                                                                                             Patient Demographics:    Daniel [name], is a 61 y.o. male, DOB - 1961-03-05, ZOX:[id number]  Outpatient Primary MD for the patient is [name], NP    LOS - 4  Admit date - 01/01/2022    Chief Complaint  Patient presents with   Shortness of Breath       Brief Narrative (HPI from H&P)   61 year old male with past medical history of Hepatitis C cirrhosis,  hepatocellular carcinoma (Dx 07/2021, follows with [name], S/P Y90 ablation 11/28/2021), iron deficiency anemia, GAVE and duodenal AVMs, ESRD (HD MWF), history of subarachnoid hemorrhage (2019), peripheral vascular disease (S/P right SFA stent), status post left BKA, diastolic congestive heart failure, pulmonary hypertension, nicotine dependence (cigarettes), gastroesophageal reflux disease who presents to [facility] emergency department with complaints of shortness of breath.  He was recently admitted for similar complaints and was diagnosed with volume overload.    Post discharge she went home and missed his last 2 dialysis treatments due to a car breakdown and transport issues, he presented again with shortness of breath was diagnosed with volume overload again.    Subjective:   Patient in bed, appears comfortable, denies any headache, no fever, no chest pain or pressure, no shortness of breath , no abdominal pain. No focal weakness.   Assessment  & Plan :   Acute hypoxic respiratory failure due to acute on chronic diastolic CHF caused by missed dialysis treatments and resultant volume overload with underlying pulmonary edema, bilateral pleural effusions - he has been strictly counseled on compliance with dialysis treatments, this morning  he is short of breath and wheezing, fluid removal nephrology team via HD.  Also seen by pulmonary this admission on 01/04/2022.  Alcohol withdrawal seizure.  Seen by neurology, CT head unremarkable, EEG nonacute, counseled to quit alcohol, placed on Keppra and counseled not to drive for 6 months, no focal deficits, noted MRI which is nonacute except incidental findings as below, continue to monitor, on CIWA protocol.   Acute on chronic diastolic CHF EF 81% on last echocardiogram in February 2023.  Please see  above.    ESRD (end stage renal disease) on dialysis [facility]) - Patient typically receives hemodialysis on Mondays Wednesdays and Fridays, as above missed 2 dialysis treatments due to transport issues, counseled, nephrology on board.  Essential hypertension - increased Norvasc dose on 01/06/2022, counseled to quit alcohol, as needed hydralazine.  Hepatocellular carcinoma [facility]) - Outpatient follow-up with [name], Status post Y90 ablation 11/28/2021.  Mixed hyperlipidemia - Continue home regimen of statin therapy  GERD without esophagitis - Continuing home regimen of daily PPI therapy.  Nicotine dependence, ongoing smoking and alcohol abuse.  Counseled to quit all.  Monitor on CIWA protocol .  Hypokalemia and hypomagnesemia.  Replaced.  Incidental finding of microhemorrhages chronic and subacute in the brain.  Likely due to poorly controlled blood pressure, could have small vessel disease as well.  Strictly control blood pressure, PCP to arrange for outpatient neurology follow-up postdischarge.       Condition - Fair  Family Communication  : brother [name] [phone] on 01/04/2022 at 9:18 AM message left  Code Status :  Full  Consults  :  Renal  PUD Prophylaxis : PPI   Procedures  :     CT head.  EEG brain.  Nonacute.  MRI brain.1. Evaluation is somewhat limited by motion artifact. Within this limitation, no acute intracranial process. No evidence of acute or subacute infarct. No  definite seizure etiology identified.   2. Redemonstrated sequela of prior hemorrhages, as described above, with 2 new foci of microhemorrhage in the left frontal lobe and right temporal lobe since 10/28/2020.      Disposition Plan  :    Status is: Inpatient  DVT Prophylaxis  :    heparin injection 5,000 Units Start: 01/02/22 0600  Lab Results  Component Value Date   PLT 84 (L) 01/06/2022    Diet :  Diet Order             Diet renal/carb modified with fluid restriction Diet-HS Snack? Nothing; Fluid restriction: 1200 mL Fluid; Room service appropriate? Yes; Fluid consistency: Thin  Diet effective now                    Inpatient Medications  Scheduled Meds:  [START ON 01/07/2022] amLODipine  10 mg Oral Daily   atorvastatin  40 mg Oral Daily   Chlorhexidine Gluconate Cloth  6 each Topical Q0600   gabapentin  300 mg Oral QHS   heparin  5,000 Units Subcutaneous Q8H   ipratropium-albuterol  3 mL Nebulization Q4H   lidocaine  1 patch Transdermal Q24H   melatonin  5 mg Oral QHS   multivitamin with minerals  1 tablet Oral Daily   nicotine  21 mg Transdermal Daily   pantoprazole  40 mg Oral BID AC   sodium chloride flush  3 mL Intravenous Q12H   thiamine (VITAMIN B1) injection  100 mg Intravenous Daily   traZODone  50 mg Oral QHS   Continuous Infusions: PRN Meds:.acetaminophen **OR** acetaminophen, alteplase, diphenhydrAMINE, heparin, hydrALAZINE, lidocaine (PF), lidocaine-prilocaine, LORazepam **OR** LORazepam, LORazepam, ondansetron **OR** ondansetron (ZOFRAN) IV, mouth rinse, pentafluoroprop-tetrafluoroeth, polyethylene glycol  Time Spent in minutes  30   Lala Lund M.D on 01/06/2022 at 10:07 AM  To page go to [email or web address]   [facility] -  Office  [phone]  See all Orders from today for further details    Objective:   Vitals:   01/06/22 0812 01/06/22 0820 01/06/22 0830 01/06/22 0910  BP: (!) 175/52  (!) 184/60 (!) 159/56  Pulse: 85  88    Resp: 18  18   Temp:      TempSrc:      SpO2: 99%  98%   Weight:  101.5 kg      Wt Readings from Last 3 Encounters:  01/06/22 101.5 kg  12/27/21 97.6 kg  12/24/21 108.9 kg     Intake/Output Summary (Last 24 hours) at 01/06/2022 1007 Last data filed at 01/06/2022 0124 Gross per 24 hour  Intake 601.15 ml  Output --  Net 601.15 ml     Physical Exam  Awake Alert, No new F.N deficits, Normal affect [place].AT,PERRAL Supple Neck, No JVD,   Symmetrical Chest wall movement, Good air movement bilaterally, mild wheezing RRR,No Gallops, Rubs or new Murmurs,  +ve B.Sounds, Abd Soft, No tenderness,   Left BKA, left arm AV fistula    RN pressure injury documentation: Pressure Injury 07/04/21 Buttocks Medial Stage 2 -  Partial thickness loss of dermis presenting as a shallow open injury with a red, pink wound bed without slough. two small areas of broken skin in between buttocks. (Active)  07/04/21 0936  Location: Buttocks  Location Orientation: Medial  Staging: Stage 2 -  Partial thickness loss of dermis presenting as a shallow open injury with a red, pink wound bed without slough.  Wound Description (Comments): two small areas of broken skin in between buttocks.  Present on Admission:      Data Review:    CBC Recent Labs  Lab 01/01/22 1955 01/01/22 2018 01/02/22 0655 01/02/22 0700 01/03/22 0129 01/05/22 0114 01/06/22 0741  WBC 3.9*  --  3.7*  --  3.8* 3.4* 3.1*  HGB 8.6*   < > 9.3* 9.9* 8.5* 8.2* 8.2*  HCT 24.5*   < > 26.2* 29.0* 24.1* 23.8* 25.3*  PLT 96*  --  97*  --  90* 75* 84*  MCV 93.9  --  94.9  --  93.4 97.5 101.2*  MCH 33.0  --  33.7  --  32.9 33.6 32.8  MCHC 35.1  --  35.5  --  35.3 34.5 32.4  RDW 18.1*  --  18.1*  --  18.2* 19.1* 18.8*  LYMPHSABS  --   --  0.3*  --  0.3*  --   --   MONOABS  --   --  0.3  --  0.7  --   --   EOSABS  --   --  0.0  --  0.0  --   --   BASOSABS  --   --  0.0  --  0.0  --   --    < > = values in this interval not displayed.     Electrolytes Recent Labs  Lab 01/01/22 1955 01/01/22 2018 01/02/22 0655 01/02/22 0700 01/03/22 0129 01/04/22 0027 01/04/22 1758 01/05/22 0114 01/05/22 0743 01/05/22 1950 01/06/22 0741  NA 120*   < > 119*   < > 129* 132* 133*  --  130* 132* 129*  K 4.4   < > 4.0   < > 3.6 3.1* 3.9  --  3.8 4.0 3.9  CL 85*   < > 83*  --  91* 94* 94*  --  94* 95* 93*  CO2 21*  --  19*  --  '26 28 25  '$ --  '27 27 26  '$ GLUCOSE 91   < > 137*  --  130* 149* 130*  --  137* 133* 94  BUN 44*   < > 49*  --  26* 17 21  --  '12 15 21  '$ CREATININE 8.68*   < > 8.91*  --  5.04* 3.41* 4.30*  --  3.13* 4.00* 4.75*  CALCIUM 8.5*  --  8.5*  --  8.5* 8.2* 8.8*  --  8.2* 8.3* 8.4*  AST 39  --  42*  --  47*  --   --   --   --   --   --   ALT 13  --  13  --  13  --   --   --   --   --   --   ALKPHOS 100  --  108  --  105  --   --   --   --   --   --   BILITOT 1.0  --  1.2  --  0.9  --   --   --   --   --   --   ALBUMIN 2.2*  --  2.4*  --  2.4*  --  2.4*  --  2.2* 2.1* 2.1*  MG  --   --   --   --  1.6* 1.9  --  1.7  --   --   --   BNP  --   --  1,034.3*  --   --   --   --   --   --   --   --    < > = values in this interval not displayed.    ID Labs Recent Labs  Lab 01/01/22 1955 01/01/22 2018 01/02/22 0655 01/03/22 0129 01/04/22 0027 01/04/22 1758 01/05/22 0114 01/05/22 0743 01/05/22 1950 01/06/22 0741  WBC 3.9*  --  3.7* 3.8*  --   --  3.4*  --   --  3.1*  PLT 96*  --  97* 90*  --   --  75*  --   --  84*  CREATININE 8.68*   < > 8.91* 5.04* 3.41* 4.30*  --  3.13* 4.00* 4.75*   < > = values in this interval not displayed.    Radiology Reports MR BRAIN WO CONTRAST  Result Date: 01/05/2022 CLINICAL DATA:  Seizure disorder EXAM: MRI HEAD WITHOUT CONTRAST TECHNIQUE: Multiplanar, multiecho pulse sequences of the brain and surrounding structures were obtained without intravenous contrast. COMPARISON:  10/28/2020 MRI head, correlation is also made with 01/04/2022 CT head FINDINGS: Evaluation is somewhat limited  by motion artifact. Brain: No restricted diffusion to suggest acute or subacute infarct. No acute hemorrhage, mass, mass effect, or midline shift. No hydrocephalus or extra-axial collection. Redemonstrated chronic microhemorrhages in the cerebellum and left frontal lobe, with additional hemosiderin deposition in the left thalamus, internal capsule, and posterior left parietal lobe. Possible new foci of microhemorrhage in the right temporal lobe (series 14, image 18) and left frontal lobe (series 14, image 40). Mildly advanced cerebral atrophy for age without lobar predominance. T2 hyperintense signal in the periventricular white matter, likely the sequela of chronic small vessel ischemic disease. Evaluation for seizure etiology is limited by motion. Within this limitation, no grossly abnormal signal in the bilateral hippocampi, which appears similar to the prior exam. No definite heterotopia or evidence of cortical dysgenesis. Vascular: Normal arterial flow voids. Skull and upper cervical spine: Normal marrow signal. Sinuses/Orbits: No acute finding. Other: The mastoids are well aerated. IMPRESSION: 1. Evaluation is somewhat limited by motion artifact. Within this limitation, no acute intracranial process. No evidence of acute or subacute infarct. No definite seizure  etiology identified. 2. Redemonstrated sequela of prior hemorrhages, as described above, with 2 new foci of microhemorrhage in the left frontal lobe and right temporal lobe since 10/28/2020. Electronically Signed   By: Merilyn Baba M.D.   On: 01/05/2022 20:30   DG CHEST PORT 1 VIEW  Result Date: 01/05/2022 CLINICAL DATA:  Shortness of breath. EXAM: PORTABLE CHEST 1 VIEW COMPARISON:  01/04/2022 and prior studies FINDINGS: Cardiomegaly with pulmonary vascular congestion again noted. Bilateral airspace opacities and small RIGHT pleural effusion are not significantly changed. There is no evidence of pneumothorax. LEFT axillary vascular stent is again  noted. IMPRESSION: Unchanged appearance of the chest with cardiomegaly, pulmonary vascular congestion, bilateral airspace opacities and small RIGHT pleural effusion. Electronically Signed   By: Margarette Canada M.D.   On: 01/05/2022 09:05   EEG adult  Result Date: 01/04/2022 Lora Havens, MD     01/04/2022  3:39 PM Patient Name: MOHAMMEDALI BEDOY MRN: [id number] Epilepsy Attending: Lora Havens Referring Physician/Provider: Thurnell Lose, MD Date: 01/04/2022 Duration: 23.16 mins Patient history: 62yo m on eeg to evaluate for seizure Level of alertness: Awake, asleep AEDs during EEG study: GBP, Ativan Technical aspects: This EEG study was done with scalp electrodes positioned according to the 10-20 International system of electrode placement. Electrical activity was reviewed with band pass filter of 1-'70Hz'$ , sensitivity of 7 uV/mm, display speed of 47m/sec with a '60Hz'$  notched filter applied as appropriate. EEG data were recorded continuously and digitally stored.  Video monitoring was available and reviewed as appropriate. Description: The posterior dominant rhythm consists of 8-9 Hz activity of moderate voltage (25-35 uV) seen predominantly in posterior head regions, symmetric and reactive to eye opening and eye closing. Sleep was characterized by vertex waves, sleep spindles (12 to 14 Hz), maximal frontocentral region. Hyperventilation and photic stimulation were not performed.   IMPRESSION: This study is within normal limits. No seizures or epileptiform discharges were seen throughout the recording. A normal interictal EEG does not exclude nor support the diagnosis of epilepsy. Priyanka OBarbra Sarks  CT HEAD WO CONTRAST (5MM)  Result Date: 01/04/2022 CLINICAL DATA:  61year old male with altered mental status and seizure disorder. EXAM: CT HEAD WITHOUT CONTRAST TECHNIQUE: Contiguous axial images were obtained from the base of the skull through the vertex without intravenous contrast. RADIATION DOSE  REDUCTION: This exam was performed according to the departmental dose-optimization program which includes automated exposure control, adjustment of the mA and/or kV according to patient size and/or use of iterative reconstruction technique. COMPARISON:  03/24/2021 CT and prior studies FINDINGS: Brain: No evidence of acute infarction, hemorrhage, hydrocephalus, extra-axial collection or mass lesion/mass effect. Mild atrophy and chronic small-vessel white matter ischemic changes again noted. Vascular: Carotid and vertebral atherosclerotic calcifications are noted. Skull: Normal. Negative for fracture or focal lesion. Sinuses/Orbits: No acute finding. Other: None. IMPRESSION: 1. No evidence of acute intracranial abnormality. 2. Mild atrophy and chronic small-vessel white matter ischemic changes. Electronically Signed   By: JMargarette CanadaM.D.   On: 01/04/2022 12:55   DG Chest Port 1 View  Result Date: 01/04/2022 CLINICAL DATA:  Shortness of breath and cough. EXAM: PORTABLE CHEST 1 VIEW COMPARISON:  01/01/2022 FINDINGS: Lungs are adequately inflated and demonstrate persistent hazy airspace opacification over the left midlung and right mid to lower lung which may be due to edema or infection. Stable small right pleural effusion likely with associated basilar atelectasis. Cardiomediastinal silhouette and remainder of the exam is unchanged. IMPRESSION: Persistent hazy airspace process over  the left midlung and right mid to lower lung which may be due to edema or infection. Stable small right pleural effusion likely with associated basilar atelectasis. Electronically Signed   By: Marin Olp M.D.   On: 01/04/2022 10:00   DG Chest Port 1 View  Result Date: 01/04/2022 CLINICAL DATA:  Shortness of breath during dialysis EXAM: PORTABLE CHEST 1 VIEW COMPARISON:  Earlier today FINDINGS: Bilateral pulmonary airspace opacity and small right pleural effusion, unchanged. Cardiomegaly. IMPRESSION: Unchanged pulmonary infiltrates  and small right pleural effusion. Electronically Signed   By: Jorje Guild M.D.   On: 01/04/2022 09:29

## 2022-01-06 NOTE — Plan of Care (Signed)
  Problem: Health Behavior/Discharge Planning: Goal: Ability to manage health-related needs will improve Outcome: Progressing   Problem: Clinical Measurements: Goal: Ability to maintain clinical measurements within normal limits will improve Outcome: Progressing Goal: Will remain free from infection Outcome: Progressing   

## 2022-01-07 ENCOUNTER — Telehealth: Payer: Self-pay | Admitting: *Deleted

## 2022-01-07 DIAGNOSIS — I5033 Acute on chronic diastolic (congestive) heart failure: Secondary | ICD-10-CM | POA: Diagnosis not present

## 2022-01-07 DIAGNOSIS — J9801 Acute bronchospasm: Secondary | ICD-10-CM | POA: Diagnosis not present

## 2022-01-07 DIAGNOSIS — J810 Acute pulmonary edema: Secondary | ICD-10-CM | POA: Diagnosis not present

## 2022-01-07 DIAGNOSIS — N186 End stage renal disease: Secondary | ICD-10-CM | POA: Diagnosis not present

## 2022-01-07 LAB — HEPATITIS B SURFACE ANTIGEN

## 2022-01-07 LAB — HEPATITIS C ANTIBODY: HCV Ab: REACTIVE — AB

## 2022-01-07 MED ORDER — THIAMINE MONONITRATE 100 MG PO TABS: 100.0000 mg | ORAL_TABLET | Freq: Every day | ORAL | Status: DC

## 2022-01-07 MED ORDER — IPRATROPIUM-ALBUTEROL 0.5-2.5 (3) MG/3ML IN SOLN: 3.0000 mL | Freq: Four times a day (QID) | RESPIRATORY_TRACT | Status: DC | PRN

## 2022-01-07 NOTE — Discharge Instructions (Signed)
Do not drive, operate heavy machinery, perform activities at heights, swimming or participation in water activities or provide baby sitting services until you have seen by Primary MD or a Neurologist and advised to do so again.  Follow with Primary MD [name], NP in 7 days   Get CBC, CMP, 2 view Chest X ray -  checked next visit within 1 week by Primary MD    Activity: As tolerated with Full fall precautions use walker/cane & assistance as needed  Disposition Home    Diet: Renal diet with strict 1.2 L fluid restriction per day.  Special Instructions: If you have smoked or chewed Tobacco  in the last 2 yrs please stop smoking, stop any regular Alcohol  and or any Recreational drug use.  On your next visit with your primary care physician please Get Medicines reviewed and adjusted.  Please request your Prim.MD to go over all Hospital Tests and Procedure/Radiological results at the follow up, please get all Hospital records sent to your Prim MD by signing hospital release before you go home.  If you experience worsening of your admission symptoms, develop shortness of breath, life threatening emergency, suicidal or homicidal thoughts you must seek medical attention immediately by calling 911 or calling your MD immediately  if symptoms less severe.  You Must read complete instructions/literature along with all the possible adverse reactions/side effects for all the Medicines you take and that have been prescribed to you. Take any new Medicines after you have completely understood and accpet all the possible adverse reactions/side effects.

## 2022-01-07 NOTE — TOC Progression Note (Signed)
Transition of Care [facility]) - Progression Note    Patient Details  Name: Daniel [name] MRN: [id number] Date of Birth: 1961/01/05  Transition of Care [facility]) CM/SW Contact  Sharin Mons, RN Phone Number: 01/07/2022, 11:19 AM  Clinical Narrative:    Pt with seizure activity this admit, hx of BKA, ESRD/HD, non compliance. From home alone. Pt with expired driver's license and  car tags. Car parked in deck. Pt cant drive self, lability issue 2/2 seizure activity.  To ensure a safe d/c to home pt will d/c once friend gets off work today. Friend will be able to provide transportation to home and  assist pt into the home.   11:30 am NCM spoke with family member Marshell Levan [place] , [phone]) on phone @ pt request. Marshell Levan stated that he will transport and get pt settled @ home.  NCM made Anderson Malta  with [facility] aware of d/c plan for today.  [facility] called and transportation  services arranged for HD sessions. Per [facility] pick up time will be between 9:30-10:00 am for 11:00 am chair time @ [facility] HD center. Pt without Rx MED concerns ... Post hospital f/u noted on AVS.  Expected Discharge Plan: [employer] Barriers to Discharge: No Barriers Identified  Expected Discharge Plan and Services Expected Discharge Plan: [place]   Discharge Planning Services: CM Consult Post Acute Care Choice: NA Living arrangements for the past 2 months: [place] Expected Discharge Date: 01/07/22               DME Arranged: N/A DME Agency: NA       HH Arranged: PT, RN, Social Work [place] Agency: [facility] Date [place]: 01/07/22 Time [place]: [id number] Representative spoke with at [place]: [place] ([place]) Interventions    Readmission Risk Interventions    10/10/2021   10:49 AM 02/27/2021   12:51 PM 07/07/2019    4:03 PM  Readmission Risk Prevention Plan  Transportation  Screening Complete Complete Complete  PCP or Specialist Appt within 3-5 Days   Complete  HRI or [place]   Complete  Social Work Consult for [employer] Planning/Counseling   Complete  Palliative Care Screening   Not Applicable  Medication Review [employer]) Complete Complete Complete  PCP or Specialist appointment within 3-5 days of discharge Complete Complete   HRI or [place] Complete Complete   SW Recovery Care/Counseling Consult Complete Complete   Palliative Care Screening Complete Not [place] Patient Refused Not Applicable

## 2022-01-07 NOTE — Progress Notes (Signed)
[facility] Progress Note   Subjective:  Seen in room - scheduled to be discharged today. Denies CP or dyspnea.  Objective Vitals:   01/06/22 1929 01/06/22 2118 01/07/22 0523 01/07/22 0800  BP:  (!) 152/64  (!) 153/64  Pulse:  96 87 87  Resp:  '19 16 18  '$ Temp:  98.6 F (37 C)  98.3 F (36.8 C)  TempSrc:  Oral    SpO2: 97% 97%  100%  Weight:       Physical Exam General: Well appearing, NAD. Room air. Heart: RRR; no murmur Lungs: CTAB; no rales Abdomen: soft Extremities: L BKA, no edema Dialysis Access: L AVF + thrill  Additional Objective Labs: Basic Metabolic Panel: Recent Labs  Lab 01/05/22 1950 01/06/22 0741 01/06/22 1814  NA 132* 129* 133*  K 4.0 3.9 3.9  CL 95* 93* 96*  CO2 '27 26 29  '$ GLUCOSE 133* 94 109*  BUN '15 21 12  '$ CREATININE 4.00* 4.75* 3.38*  CALCIUM 8.3* 8.4* 8.7*  PHOS 2.4* 3.7 2.0*   Liver Function Tests: Recent Labs  Lab 01/01/22 1955 01/02/22 0655 01/03/22 0129 01/04/22 1758 01/05/22 1950 01/06/22 0741 01/06/22 1814  AST 39 42* 47*  --   --   --   --   ALT '13 13 13  '$ --   --   --   --   ALKPHOS 100 108 105  --   --   --   --   BILITOT 1.0 1.2 0.9  --   --   --   --   PROT 7.4 8.2* 8.0  --   --   --   --   ALBUMIN 2.2* 2.4* 2.4*   < > 2.1* 2.1* 2.2*   < > = values in this interval not displayed.   CBC: Recent Labs  Lab 01/01/22 1955 01/01/22 2018 01/02/22 0655 01/02/22 0700 01/03/22 0129 01/05/22 0114 01/06/22 0741  WBC 3.9*  --  3.7*  --  3.8* 3.4* 3.1*  NEUTROABS  --   --  3.0  --  2.8  --   --   HGB 8.6*   < > 9.3*   < > 8.5* 8.2* 8.2*  HCT 24.5*   < > 26.2*   < > 24.1* 23.8* 25.3*  MCV 93.9  --  94.9  --  93.4 97.5 101.2*  PLT 96*  --  97*  --  90* 75* 84*   < > = values in this interval not displayed.   CBG: Recent Labs  Lab 01/05/22 0313 01/05/22 0723 01/05/22 1110 01/05/22 1724 01/05/22 2029  GLUCAP 139* 126* 149* 129* 124*   Studies/Results: MR BRAIN WO CONTRAST  Result Date:  01/05/2022 CLINICAL DATA:  Seizure disorder EXAM: MRI HEAD WITHOUT CONTRAST TECHNIQUE: Multiplanar, multiecho pulse sequences of the brain and surrounding structures were obtained without intravenous contrast. COMPARISON:  10/28/2020 MRI head, correlation is also made with 01/04/2022 CT head FINDINGS: Evaluation is somewhat limited by motion artifact. Brain: No restricted diffusion to suggest acute or subacute infarct. No acute hemorrhage, mass, mass effect, or midline shift. No hydrocephalus or extra-axial collection. Redemonstrated chronic microhemorrhages in the cerebellum and left frontal lobe, with additional hemosiderin deposition in the left thalamus, internal capsule, and posterior left parietal lobe. Possible new foci of microhemorrhage in the right temporal lobe (series 14, image 18) and left frontal lobe (series 14, image 40). Mildly advanced cerebral atrophy for age without lobar predominance. T2 hyperintense signal in the periventricular white matter,  likely the sequela of chronic small vessel ischemic disease. Evaluation for seizure etiology is limited by motion. Within this limitation, no grossly abnormal signal in the bilateral hippocampi, which appears similar to the prior exam. No definite heterotopia or evidence of cortical dysgenesis. Vascular: Normal arterial flow voids. Skull and upper cervical spine: Normal marrow signal. Sinuses/Orbits: No acute finding. Other: The mastoids are well aerated. IMPRESSION: 1. Evaluation is somewhat limited by motion artifact. Within this limitation, no acute intracranial process. No evidence of acute or subacute infarct. No definite seizure etiology identified. 2. Redemonstrated sequela of prior hemorrhages, as described above, with 2 new foci of microhemorrhage in the left frontal lobe and right temporal lobe since 10/28/2020. Electronically Signed   By: Merilyn Baba M.D.   On: 01/05/2022 20:30    Medications:   amLODipine  10 mg Oral Daily   atorvastatin   40 mg Oral Daily   Chlorhexidine Gluconate Cloth  6 each Topical Q0600   gabapentin  300 mg Oral QHS   heparin  5,000 Units Subcutaneous Q8H   lidocaine  1 patch Transdermal Q24H   melatonin  5 mg Oral QHS   multivitamin with minerals  1 tablet Oral Daily   nicotine  21 mg Transdermal Daily   pantoprazole  40 mg Oral BID AC   sodium chloride flush  3 mL Intravenous Q12H   thiamine (VITAMIN B1) injection  100 mg Intravenous Daily   traZODone  50 mg Oral QHS    Dialysis Orders: MWF [facility] 4h  450/ 500   101kg   2/2 bath  Hep none  LUA AVF  hep B labs 8/14 - last HD 8/28 outpt, post 103.7kg, coming in 8- 12kg over x 2 wks - last Hb 8.0 on 8/21   Assessment/Plan: Seizure-like activity/Poss ETOH withdrawal seizure: Neuro consulted. His mentation is now back to baseline. Head CT and EEG (-). MRI brain with chronic microhemorrhages only. Acute on chronic hypoxic respiratory failure/chronic diastolic CHF: Much improved with serial dialysis, now below prior EDW - lower on discharge. ESRD - on HD MWF, next tomorrow as outpatient. Tells me transportation is sorted, will see. ETOH abuse - per primary HTN/ vol - BP's improved. Continue current regimen H/o [facility] - f/b oncology  Veneta Penton, Hershal Coria 01/07/2022, 9:59 AM  [employer]

## 2022-01-07 NOTE — Patient Outreach (Signed)
  Care Coordination TOC Note Transition Care Management Unsuccessful Follow-up Telephone Call  Date of discharge and from where:  12/11/21 [facility]  Attempts:  2nd Attempt  Reason for unsuccessful TCM follow-up call:  Unable to reach patient. Patient currently hospitalized.   Chong Sicilian, BSN, RN-BC [place] / [facility], community Dial: [phone]

## 2022-01-07 NOTE — Discharge Summary (Signed)
[name]:[id number] DOB: April 11, 1961 DOA: 01/01/2022  PCP: [name], NP  Admit date: 01/01/2022  Discharge date: 01/07/2022  Admitted From: Home   Disposition:  Home   Recommendations for Outpatient Follow-up:   Follow up with PCP in 1-2 weeks  PCP Please obtain BMP/CBC, 2 view CXR in 1week,  (see Discharge instructions)   PCP Please follow up on the following pending results:    Home Health: PT, RN if he qualifies   Equipment/Devices: None  Consultations: Neuro, PCCM, Renal Discharge Condition: Stable    CODE STATUS: Full    Diet Recommendation: Renal diet with strict 1.2 L fluid restriction    Chief Complaint  Patient presents with   Shortness of Breath     Brief history of present illness from the day of admission and additional interim summary    61 year old male with past medical history of Hepatitis C cirrhosis,  hepatocellular carcinoma (Dx 07/2021, follows with [name], S/P Y90 ablation 11/28/2021), iron deficiency anemia, GAVE and duodenal AVMs, ESRD (HD MWF), history of subarachnoid hemorrhage (2019), peripheral vascular disease (S/P right SFA stent), status post left BKA, diastolic congestive heart failure, pulmonary hypertension, nicotine dependence (cigarettes), gastroesophageal reflux disease who presents to [facility] emergency department with complaints of shortness of breath.  He was recently admitted for similar complaints and was diagnosed with volume overload.     Post discharge she went home and missed his last 2 dialysis treatments due to a car breakdown and transport issues, he presented again with shortness of breath was diagnosed with volume overload again.                                                                 Hospital Course    Acute hypoxic  respiratory failure due to acute on chronic diastolic CHF caused by missed dialysis treatments and resultant volume overload with underlying pulmonary edema, bilateral pleural effusions - he has been strictly counseled on compliance with dialysis treatments, this morning he is short of breath and wheezing, fluid removal nephrology team via HD.  Also seen by pulmonary this admission on 01/04/2022.  With multiple HD runs and fluid removal wheezing and shortness of breath has resolved.  This problem has resolved he is symptom-free on room air now.  Counseled on compliance with HD treatments.   Alcohol withdrawal seizure.  Seen by neurology, CT head unremarkable, EEG nonacute, counseled to quit alcohol, seen by neurology counseled not to drive for 6 months, no AEDs for now, no focal deficits, noted MRI which is nonacute except incidental findings as below, stable now.  Not in DTs anymore strictly counseled to quit alcohol.   Acute on chronic diastolic CHF EF 79% on last echocardiogram in February 2023.  Please see above.  ESRD (end stage renal disease) on dialysis [facility]) - Patient typically receives hemodialysis on Mondays Wednesdays and Fridays, as above missed 2 dialysis treatments due to transport issues, counseled, nephrology on board.   Essential hypertension - continue home regimen with outpatient follow-up and monitoring with PCP.   Hepatocellular carcinoma [facility]) - Outpatient follow-up with [name], Status post Y90 ablation 11/28/2021.   Mixed hyperlipidemia - Continue home regimen of statin therapy   GERD without esophagitis - Continuing home regimen of daily PPI therapy.   Nicotine dependence, ongoing smoking and alcohol abuse.  Counseled to quit all.  Table now.   Hypokalemia and hypomagnesemia.  Replaced.   Incidental finding of microhemorrhages chronic and subacute in the brain.  Likely due to poorly controlled blood pressure, could have small vessel disease as well.  Strictly control  blood pressure, PCP to arrange for outpatient neurology follow-up postdischarge.   Discharge diagnosis     Principal Problem:   Volume overload Active Problems:   Acute on chronic diastolic CHF (congestive heart failure) (HCC)   Hyponatremia   Alcohol abuse   Acute pulmonary edema (HCC)   Bilateral pleural effusion   ESRD (end stage renal disease) on dialysis (HCC)   Essential hypertension   Hepatocellular carcinoma (HCC)   Mixed hyperlipidemia   GERD without esophagitis   Nicotine dependence, cigarettes, uncomplicated    Discharge instructions    Discharge Instructions     Discharge instructions   Complete by: As directed    Do not drive, operate heavy machinery, perform activities at heights, swimming or participation in water activities or provide baby sitting services until you have seen by Primary MD or a Neurologist and advised to do so again.  Follow with Primary MD [name], NP in 7 days   Get CBC, CMP, 2 view Chest X ray -  checked next visit within 1 week by Primary MD    Activity: As tolerated with Full fall precautions use walker/cane & assistance as needed  Disposition Home    Diet: Renal diet with strict 1.2 L fluid restriction per day.  Special Instructions: If you have smoked or chewed Tobacco  in the last 2 yrs please stop smoking, stop any regular Alcohol  and or any Recreational drug use.  On your next visit with your primary care physician please Get Medicines reviewed and adjusted.  Please request your Prim.MD to go over all Hospital Tests and Procedure/Radiological results at the follow up, please get all Hospital records sent to your Prim MD by signing hospital release before you go home.  If you experience worsening of your admission symptoms, develop shortness of breath, life threatening emergency, suicidal or homicidal thoughts you must seek medical attention immediately by calling 911 or calling your MD immediately  if symptoms less  severe.  You Must read complete instructions/literature along with all the possible adverse reactions/side effects for all the Medicines you take and that have been prescribed to you. Take any new Medicines after you have completely understood and accpet all the possible adverse reactions/side effects.   Increase activity slowly   Complete by: As directed        Discharge Medications   Allergies as of 01/07/2022       Reactions   Seroquel [quetiapine] Other (See Comments)   Tardive kinesia Dystonia    Dilaudid [hydromorphone Hcl] Itching, Other (See Comments)   Pt reports itchiness after IM injection         Medication List  TAKE these medications    amLODipine 5 MG tablet Commonly known as: NORVASC Take 1 tablet (5 mg total) by mouth daily.   atorvastatin 40 MG tablet Commonly known as: LIPITOR Take 1 tablet (40 mg total) by mouth daily.   folic acid 1 MG tablet Commonly known as: FOLVITE Take 1 tablet (1 mg total) by mouth daily.   multivitamin with minerals Tabs tablet Take 1 tablet by mouth daily.   pantoprazole 40 MG tablet Commonly known as: PROTONIX Take 1 tablet (40 mg total) by mouth 2 (two) times daily before a meal.   thiamine 100 MG tablet Commonly known as: VITAMIN B1 Take 1 tablet (100 mg total) by mouth daily.   traZODone 50 MG tablet Commonly known as: DESYREL Take 1 tablet (50 mg total) by mouth at bedtime as needed for sleep. What changed: when to take this         Follow-up Information     Medicaid Transportation. Call.   Why: Call to set up transportation. Assessment may need to be completed. Contact information: [phone]        [facility] Follow up.   Specialty: [place] Why: someone from the office will call to schedule home health visits Contact information: [address] [place] [address] [phone]         [name], NP. Schedule an appointment as soon as  possible for a visit in 1 week(s).   Specialty: Internal Medicine Contact information: [place] [address] [phone]         Lorretta Harp, MD. Schedule an appointment as soon as possible for a visit in 1 week(s).   Specialties: Cardiology, Radiology Contact information: [address] [place] [address] [phone]         [place]. Schedule an appointment as soon as possible for a visit in 1 month(s).   Why: Likely alcohol withdrawal seizure Contact information: [address]     [place] [address] [phone]                Major procedures and Radiology Reports - PLEASE review detailed and final reports thoroughly  -      MR BRAIN WO CONTRAST  Result Date: 01/05/2022 CLINICAL DATA:  Seizure disorder EXAM: MRI HEAD WITHOUT CONTRAST TECHNIQUE: Multiplanar, multiecho pulse sequences of the brain and surrounding structures were obtained without intravenous contrast. COMPARISON:  10/28/2020 MRI head, correlation is also made with 01/04/2022 CT head FINDINGS: Evaluation is somewhat limited by motion artifact. Brain: No restricted diffusion to suggest acute or subacute infarct. No acute hemorrhage, mass, mass effect, or midline shift. No hydrocephalus or extra-axial collection. Redemonstrated chronic microhemorrhages in the cerebellum and left frontal lobe, with additional hemosiderin deposition in the left thalamus, internal capsule, and posterior left parietal lobe. Possible new foci of microhemorrhage in the right temporal lobe (series 14, image 18) and left frontal lobe (series 14, image 40). Mildly advanced cerebral atrophy for age without lobar predominance. T2 hyperintense signal in the periventricular white matter, likely the sequela of chronic small vessel ischemic disease. Evaluation for seizure etiology is limited by motion. Within this limitation, no grossly abnormal  signal in the bilateral hippocampi, which appears similar to the prior exam. No definite heterotopia or evidence of cortical dysgenesis. Vascular: Normal arterial flow voids. Skull and upper cervical spine: Normal marrow signal. Sinuses/Orbits: No acute finding. Other: The mastoids are well aerated.  IMPRESSION: 1. Evaluation is somewhat limited by motion artifact. Within this limitation, no acute intracranial process. No evidence of acute or subacute infarct. No definite seizure etiology identified. 2. Redemonstrated sequela of prior hemorrhages, as described above, with 2 new foci of microhemorrhage in the left frontal lobe and right temporal lobe since 10/28/2020. Electronically Signed   By: Merilyn Baba M.D.   On: 01/05/2022 20:30   DG CHEST PORT 1 VIEW  Result Date: 01/05/2022 CLINICAL DATA:  Shortness of breath. EXAM: PORTABLE CHEST 1 VIEW COMPARISON:  01/04/2022 and prior studies FINDINGS: Cardiomegaly with pulmonary vascular congestion again noted. Bilateral airspace opacities and small RIGHT pleural effusion are not significantly changed. There is no evidence of pneumothorax. LEFT axillary vascular stent is again noted. IMPRESSION: Unchanged appearance of the chest with cardiomegaly, pulmonary vascular congestion, bilateral airspace opacities and small RIGHT pleural effusion. Electronically Signed   By: Margarette Canada M.D.   On: 01/05/2022 09:05   EEG adult  Result Date: 01/04/2022 Lora Havens, MD     01/04/2022  3:39 PM Patient Name: Daniel [name] MRN: [id number] Epilepsy Attending: Lora Havens Referring Physician/Provider: Thurnell Lose, MD Date: 01/04/2022 Duration: 23.16 mins Patient history: 61yo m on eeg to evaluate for seizure Level of alertness: Awake, asleep AEDs during EEG study: GBP, Ativan Technical aspects: This EEG study was done with scalp electrodes positioned according to the 10-20 International system of electrode placement. Electrical activity was reviewed with band  pass filter of 1-'70Hz'$ , sensitivity of 7 uV/mm, display speed of 60m/sec with a '60Hz'$  notched filter applied as appropriate. EEG data were recorded continuously and digitally stored.  Video monitoring was available and reviewed as appropriate. Description: The posterior dominant rhythm consists of 8-9 Hz activity of moderate voltage (25-35 uV) seen predominantly in posterior head regions, symmetric and reactive to eye opening and eye closing. Sleep was characterized by vertex waves, sleep spindles (12 to 14 Hz), maximal frontocentral region. Hyperventilation and photic stimulation were not performed.   IMPRESSION: This study is within normal limits. No seizures or epileptiform discharges were seen throughout the recording. A normal interictal EEG does not exclude nor support the diagnosis of epilepsy. Priyanka OBarbra Sarks  CT HEAD WO CONTRAST (5MM)  Result Date: 01/04/2022 CLINICAL DATA:  61year old male with altered mental status and seizure disorder. EXAM: CT HEAD WITHOUT CONTRAST TECHNIQUE: Contiguous axial images were obtained from the base of the skull through the vertex without intravenous contrast. RADIATION DOSE REDUCTION: This exam was performed according to the departmental dose-optimization program which includes automated exposure control, adjustment of the mA and/or kV according to patient size and/or use of iterative reconstruction technique. COMPARISON:  03/24/2021 CT and prior studies FINDINGS: Brain: No evidence of acute infarction, hemorrhage, hydrocephalus, extra-axial collection or mass lesion/mass effect. Mild atrophy and chronic small-vessel white matter ischemic changes again noted. Vascular: Carotid and vertebral atherosclerotic calcifications are noted. Skull: Normal. Negative for fracture or focal lesion. Sinuses/Orbits: No acute finding. Other: None. IMPRESSION: 1. No evidence of acute intracranial abnormality. 2. Mild atrophy and chronic small-vessel white matter ischemic changes.  Electronically Signed   By: JMargarette CanadaM.D.   On: 01/04/2022 12:55   DG Chest Port 1 View  Result Date: 01/04/2022 CLINICAL DATA:  Shortness of breath and cough. EXAM: PORTABLE CHEST 1 VIEW COMPARISON:  01/01/2022 FINDINGS: Lungs are adequately inflated and demonstrate persistent hazy airspace opacification over the left midlung and right mid to lower lung which may be due to edema or  infection. Stable small right pleural effusion likely with associated basilar atelectasis. Cardiomediastinal silhouette and remainder of the exam is unchanged. IMPRESSION: Persistent hazy airspace process over the left midlung and right mid to lower lung which may be due to edema or infection. Stable small right pleural effusion likely with associated basilar atelectasis. Electronically Signed   By: Marin Olp M.D.   On: 01/04/2022 10:00   DG Chest Port 1 View  Result Date: 01/04/2022 CLINICAL DATA:  Shortness of breath during dialysis EXAM: PORTABLE CHEST 1 VIEW COMPARISON:  Earlier today FINDINGS: Bilateral pulmonary airspace opacity and small right pleural effusion, unchanged. Cardiomegaly. IMPRESSION: Unchanged pulmonary infiltrates and small right pleural effusion. Electronically Signed   By: Jorje Guild M.D.   On: 01/04/2022 09:29   DG Chest 2 View  Result Date: 01/01/2022 CLINICAL DATA:  Shortness of breath.  Missed dialysis. EXAM: CHEST - 2 VIEW COMPARISON:  Radiograph 12/26/2021, CT 12/25/2021 FINDINGS: Chronic cardiomegaly. Unchanged mediastinal contours with aortic atherosclerosis and tortuosity. Right greater than left pleural effusions with slight increase. Patchy airspace disease in the left mid lung persists but has decreased in density. Patchy airspace disease at the right lung base has worsened. Vascular congestion. No pneumothorax. Left axillary vascular stent. IMPRESSION: 1. Increased pleural effusions.  Vascular congestion. 2. Shifting airspace disease. Improvement in left perihilar opacities,  patchy airspace disease at the right lung base has worsened. Electronically Signed   By: Keith Rake M.D.   On: 01/01/2022 20:38   DG CHEST PORT 1 VIEW  Result Date: 12/26/2021 CLINICAL DATA:  Provided history: Left upper lobe transbronchial biopsy. EXAM: PORTABLE CHEST 1 VIEW COMPARISON:  Prior chest radiographs 12/26/2021 and earlier chest CT 12/25/2021. FINDINGS: Cardiomegaly. Aortic atherosclerosis. Multifocal bilateral airspace disease, not significantly changed from the chest radiograph performed earlier today. Small right pleural effusion. No evidence of pneumothorax. Degenerative changes of the spine. A vascular stent projects in the region of the left axilla. IMPRESSION: 1. No evidence of pneumothorax status post reported left upper lobe transbronchial biopsy. 2. Cardiomegaly. 3. Multifocal bilateral airspace disease, not significantly changed from the chest radiograph performed earlier today. 4. Persistent small right pleural effusion. 5. Aortic Atherosclerosis (ICD10-I70.0). Electronically Signed   By: Kellie Simmering D.O.   On: 12/26/2021 17:05   DG C-ARM BRONCHOSCOPY  Result Date: 12/26/2021 C-ARM BRONCHOSCOPY: Fluoroscopy was utilized by the requesting physician.  No radiographic interpretation.   DG CHEST PORT 1 VIEW  Result Date: 12/26/2021 CLINICAL DATA:  Encounter for CHF EXAM: PORTABLE CHEST 1 VIEW COMPARISON:  Portable exam 0516 hours compared to 12/25/2021 Correlation: CT chest 12/25/2021 FINDINGS: Enlargement of cardiac silhouette with pulmonary vascular congestion. Atherosclerotic calcification aorta. Enlargement of RIGHT hilum, question adenopathy, hila poorly assessed on prior CT due to lack of IV contrast. Mid lung infiltrates bilaterally favoring multifocal pneumonia. Small RIGHT pleural effusion and mild RIGHT basilar atelectasis. No acute osseous findings or pneumothorax. Vascular stent LEFT axilla. IMPRESSION: BILATERAL pulmonary infiltrates favoring multifocal pneumonia.  Small RIGHT pleural effusion and basilar atelectasis. Enlarged RIGHT pulmonary hilum question adenopathy versus perihilar infiltrate. Aortic Atherosclerosis (ICD10-I70.0). Electronically Signed   By: Lavonia Dana M.D.   On: 12/26/2021 07:57   CT CHEST WO CONTRAST  Result Date: 12/25/2021 CLINICAL DATA:  Evaluate pneumonia. History of multifocal hepatocellular carcinoma. EXAM: CT CHEST WITHOUT CONTRAST TECHNIQUE: Multidetector CT imaging of the chest was performed following the standard protocol without IV contrast. RADIATION DOSE REDUCTION: This exam was performed according to the departmental dose-optimization program which includes automated exposure  control, adjustment of the mA and/or kV according to patient size and/or use of iterative reconstruction technique. COMPARISON:  CT 10/09/2021 FINDINGS: Cardiovascular: Mild cardiac enlargement. Aortic atherosclerosis. Coronary artery calcifications. No pericardial effusion. Mediastinum/Nodes: Normal appearance of the thyroid gland. The trachea appears patent and is midline. Normal appearance of the esophagus. No enlarged axillary lymph nodes. Left lower paratracheal lymph node measures 1.5 cm in short axis. Previously 1 cm. Hilar lymph nodes are suboptimally evaluated due to lack of IV contrast. Lungs/Pleura: Small to moderate volume right pleural effusion is increased from the previous exam. There is a small left pleural effusion which is also increased in volume from previous exam. Multifocal bilateral airspace disease is identified involving the posterior left upper lobe, lingula, left lower lobe, or right lower lobe and central right middle lobe. Mild peribronchovascular airspace and ground-glass densities noted in the right upper lobe. The no pneumothorax identified. No suspicious pulmonary nodule or mass identified. Upper Abdomen: The liver is cirrhotic. There is perihepatic ascites noted. Known, treated central liver lesion is incompletely characterized on  today's study without IV contrast. This measures approximately 4.9 x 4.1 cm, image 125/3. Musculoskeletal: No chest wall mass or suspicious bone lesions identified. IMPRESSION: 1. Multifocal bilateral airspace disease is identified compatible with multifocal pneumonia. 2. Bilateral pleural effusions are increased in volume from previous exam. 3. Cirrhosis with stigmata of portal venous hypertension including ascites. 4. Known central liver lesion is incompletely characterized on today's study without IV contrast. 5. Coronary artery calcifications. 6. Aortic Atherosclerosis (ICD10-I70.0). Electronically Signed   By: Kerby Moors M.D.   On: 12/25/2021 08:59   DG Chest 2 View  Result Date: 12/25/2021 CLINICAL DATA:  Chest pain, shortness of breath, EXAM: CHEST - 2 VIEW COMPARISON:  Chest radiograph dated 12/20/2021. CT chest dated 10/09/2021. FINDINGS: Multifocal patchy opacities in the lingula and bilateral lower lobes. Notably, while progressive, this was present on prior CT chest dated 10/09/2021. 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 Small right pleural effusion. Mild cardiomegaly.  Thoracic aortic atherosclerosis. IMPRESSION: Multifocal patchy opacities in the lingula and bilateral lower lobes, possibly reflecting multifocal pneumonia, but raising concern for multifocal semi invasive adenocarcinoma given persistence. Pulmonology consultation is suggested. Small right pleural effusion. Electronically Signed   By: Julian Hy M.D.   On: 12/25/2021 01:24   DG Chest Port 1 View  Result Date: 12/20/2021 CLINICAL DATA:  61 year old male with history of shortness of breath. EXAM: PORTABLE CHEST 1 VIEW COMPARISON:  Chest x-ray 12/10/2021. FINDINGS: Patchy multifocal interstitial and airspace disease is noted throughout the mid to lower lungs bilaterally, most severe in the perihilar  aspects of both lungs, slightly worsened compared to the prior study. Small bilateral pleural effusions (right greater than left). No pneumothorax. No definite pulmonary edema. Heart size is normal. Upper mediastinal contours are within normal limits. Atherosclerotic calcifications are noted in the thoracic aorta. IMPRESSION: 1. The appearance of the chest is concerning for multilobar bilateral pneumonia, slightly worsened compared to the prior study. 2. Small bilateral pleural effusions (right greater than left). 3. Aortic atherosclerosis. Electronically Signed   By: Vinnie Langton M.D.   On: 12/20/2021 06:16   DG CHEST PORT 1 VIEW  Result Date: 12/10/2021 CLINICAL DATA:  dialysis patient with shortness of breath. EXAM: PORTABLE CHEST 1 VIEW COMPARISON:  12/09/2021 FINDINGS: Stable cardiomediastinal contours. Aortic atherosclerotic calcifications. Unchanged appearance of hazy opacity within the left midlung and bilateral lung bases.  No atelectasis. No definite pleural effusion identified. IMPRESSION: No change in left mid lung and bilateral lung base hazy opacities which may reflect areas of asymmetric edema versus infection. Cardiac enlargement. Aortic Atherosclerosis (ICD10-I70.0). Electronically Signed   By: Kerby Moors M.D.   On: 12/10/2021 10:59   DG Chest Portable 1 View  Result Date: 12/09/2021 CLINICAL DATA:  Atraumatic penile bleeding. EXAM: PORTABLE CHEST 1 VIEW COMPARISON:  November 27, 2021 FINDINGS: Stable, mild to moderate severity enlargement of the cardiac silhouette is seen. There is moderate severity calcification of the aortic arch. Mild, hazy atelectasis and/or infiltrate is seen within the mid left lung and bilateral lung bases. A mild amount of fluid is again noted within the horizontal fissure. No pneumothorax is identified. A radiopaque vascular stent is seen overlying the left shoulder. The visualized skeletal structures are unremarkable. IMPRESSION: 1. Stable cardiomegaly with  mild mid left lung and mild bibasilar atelectasis and/or infiltrate. Electronically Signed   By: Virgina Norfolk M.D.   On: 12/09/2021 04:37    Micro Results    Recent Results (from the past 240 hour(s))  MRSA Next Gen by PCR, Nasal     Status: None   Collection Time: 01/04/22 12:57 PM   Specimen: Nasal Mucosa; Nasal Swab  Result Value Ref Range Status   MRSA by PCR Next Gen NOT DETECTED NOT DETECTED Final    Comment: (NOTE) The GeneXpert MRSA Assay (FDA approved for NASAL specimens only), is one component of a comprehensive MRSA colonization surveillance program. It is not intended to diagnose MRSA infection nor to guide or monitor treatment for MRSA infections. Test performance is not FDA approved in patients less than 83 years old. Performed at [facility], [place] [address], [place], [place] [address]     Today   Subjective    Dillard Pascal today has no headache,no chest abdominal pain,no new weakness tingling or numbness, feels much better wants to go home today.    Objective   Blood pressure 150/64, pulse 87, temperature 98.3 F (36.8 C), resp. rate 18, weight 98.2 kg, SpO2 100 %.   Intake/Output Summary (Last 24 hours) at 01/07/2022 0931 Last data filed at 01/06/2022 1148 Gross per 24 hour  Intake --  Output 3.5 ml  Net -3.5 ml    Exam  Awake Alert, No new F.N deficits,    [place].AT,PERRAL Supple Neck,   Symmetrical Chest wall movement, Good air movement bilaterally, CTAB RRR,No Gallops,   +ve B.Sounds, Abd Soft, Non tender,  Left BKA, left arm AV fistula   Data Review   Recent Labs  Lab 01/01/22 1955 01/01/22 2018 01/02/22 0655 01/02/22 0700 01/03/22 0129 01/05/22 0114 01/06/22 0741  WBC 3.9*  --  3.7*  --  3.8* 3.4* 3.1*  HGB 8.6*   < > 9.3* 9.9* 8.5* 8.2* 8.2*  HCT 24.5*   < > 26.2* 29.0* 24.1* 23.8* 25.3*  PLT 96*  --  97*  --  90* 75* 84*  MCV 93.9  --  94.9  --  93.4 97.5 101.2*  MCH 33.0  --  33.7  --  32.9 33.6 32.8  MCHC 35.1   --  35.5  --  35.3 34.5 32.4  RDW 18.1*  --  18.1*  --  18.2* 19.1* 18.8*  LYMPHSABS  --   --  0.3*  --  0.3*  --   --   MONOABS  --   --  0.3  --  0.7  --   --  EOSABS  --   --  0.0  --  0.0  --   --   BASOSABS  --   --  0.0  --  0.0  --   --    < > = values in this interval not displayed.    Recent Labs  Lab 01/01/22 1955 01/01/22 2018 01/02/22 0655 01/02/22 0700 01/03/22 0129 01/03/22 0129 01/04/22 0027 01/04/22 1758 01/05/22 0114 01/05/22 0743 01/05/22 1950 01/06/22 0741 01/06/22 1814  NA 120*   < > 119*   < > 129*  --  132* 133*  --  130* 132* 129* 133*  K 4.4   < > 4.0   < > 3.6  --  3.1* 3.9  --  3.8 4.0 3.9 3.9  CL 85*   < > 83*  --  91*  --  94* 94*  --  94* 95* 93* 96*  CO2 21*  --  19*  --  26  --  28 25  --  '27 27 26 29  '$ GLUCOSE 91   < > 137*  --  130*  --  149* 130*  --  137* 133* 94 109*  BUN 44*   < > 49*  --  26*  --  17 21  --  '12 15 21 12  '$ CREATININE 8.68*   < > 8.91*  --  5.04*  --  3.41* 4.30*  --  3.13* 4.00* 4.75* 3.38*  CALCIUM 8.5*  --  8.5*  --  8.5*  --  8.2* 8.8*  --  8.2* 8.3* 8.4* 8.7*  AST 39  --  42*  --  47*  --   --   --   --   --   --   --   --   ALT 13  --  13  --  13  --   --   --   --   --   --   --   --   ALKPHOS 100  --  108  --  105  --   --   --   --   --   --   --   --   BILITOT 1.0  --  1.2  --  0.9  --   --   --   --   --   --   --   --   ALBUMIN 2.2*  --  2.4*  --  2.4*  --   --  2.4*  --  2.2* 2.1* 2.1* 2.2*  MG  --   --   --   --  1.6*  --  1.9  --  1.7  --   --   --   --   PHOS  --   --   --   --   --    < >  --  2.5 1.5* 1.4* 2.4* 3.7 2.0*  BNP  --   --  1,034.3*  --   --   --   --   --   --   --   --   --   --    < > = values in this interval not displayed.    Total Time in preparing paper work, data evaluation and todays exam - 42 minutes  Lala Lund M.D on 01/07/2022 at 9:31 AM  [facility]

## 2022-01-07 NOTE — Progress Notes (Signed)
Pt d/c to home today. Contacted Miguel Barrera SW to advise clinic of pt's d/c today and that pt will resume care tomorrow. Clinic also advised that hospital RN CM assisted pt with making transportation appts with [facility] [place] for HD appts.   Melven Sartorius Renal Navigator [phone]

## 2022-01-07 NOTE — TOC Transition Note (Signed)
Transition of Care [facility]) - CM/SW Discharge Note   Patient Details  Name: Daniel [name] MRN: [id number] Date of Birth: 1960/08/29  Transition of Care [facility]) CM/SW Contact:  Sharin Mons, RN Phone Number: 01/07/2022, 11:57 AM   Clinical Narrative:    Patient will DC to: home Anticipated DC date: 01/07/2022 Family notified: yes, Kirke Shaggy, [phone] Transport by: car   Per MD patient ready for DC today . RN, patient, patient's family, and [facility] notified of DC.    RNCM will sign off for now as intervention is no longer needed. Please consult [place] again if new needs arise.      Barriers to Discharge: No Barriers Identified   Patient Goals and CMS Choice Patient states their goals for this hospitalization and ongoing recovery are:: return home CMS Medicare.gov Compare Post Acute Care list provided to:: Patient Choice offered to / list presented to : Patient  Discharge Placement                       Discharge Plan and Services   Discharge Planning Services: CM Consult Post Acute Care Choice: NA          DME Arranged: N/A DME Agency: NA       HH Arranged: PT, RN, Social Work [employer] Agency: [facility] Date [place]: 01/07/22 Time [place]: [id number] Representative spoke with at [place]: [place] ([place]) Interventions     Readmission Risk Interventions    10/10/2021   10:49 AM 02/27/2021   12:51 PM 07/07/2019    4:03 PM  Readmission Risk Prevention Plan  Transportation Screening Complete Complete Complete  PCP or Specialist Appt within 3-5 Days   Complete  HRI or [place]   Complete  Social Work Consult for [place] Planning/Counseling   Complete  Palliative Care Screening   Not Applicable  Medication Review Press photographer) Complete Complete Complete  PCP or Specialist appointment within 3-5 days of discharge Complete Complete   HRI or [place] Complete  Complete   SW Recovery Care/Counseling Consult Complete Complete   Palliative Care Screening Complete Not [place] Patient Refused Not Applicable

## 2022-01-07 NOTE — Progress Notes (Signed)
Physical Therapy Treatment Patient Details Name: Daniel [name] MRN: [id number] DOB: 04-18-1961 Today's Date: 01/07/2022   History of Present Illness Pt is a 61 y/o male who presented to [facility] ED 01/01/2022 with shortness of breath 2 volume overload. Pt had 2 witnessed seizures while in HD on 01/04/22 and was transferred to ICU. PMH includes: hepatocellular carcinoma-s/p Y90 ablation on 8/3, anemia, recent GI bleeding due to AVMs, DM, ESRD on HD (MWF), HTN, ICD, PAD s/p L BKA.    PT Comments    Pt instructed in and completed therapeutic exercises and activities. Pt performing squat pivot transfers from bed <> chair at supervision level. Therex focused on left quadriceps strengthening. Progress as tolerated.    Recommendations for follow up therapy are one component of a multi-disciplinary discharge planning process, led by the attending physician.  Recommendations may be updated based on patient status, additional functional criteria and insurance authorization.  Follow Up Recommendations  Home health PT (For prosthetic training - unable to drive to outpatient appts 2 seizures while admitted)     Assistance Recommended at Discharge PRN  Patient can return home with the following A little help with walking and/or transfers;A little help with bathing/dressing/bathroom;Assistance with cooking/housework;Help with stairs or ramp for entrance;Assist for transportation   Equipment Recommendations  None recommended by PT    Recommendations for Other Services       Precautions / Restrictions Precautions Precautions: Fall Precaution Comments: L BKA - has prosthesis but not present at the hospital as of 01/05/22. Restrictions Weight Bearing Restrictions: Yes LLE Weight Bearing: Non weight bearing     Mobility  Bed Mobility Overal bed mobility: Needs Assistance Bed Mobility: Supine to Sit     Supine to sit: Supervision     General bed mobility comments: Pt performed supine to sit x 2  (once on each side). Supervision for safety    Transfers Overall transfer level: Needs assistance Equipment used: None Transfers: Bed to chair/wheelchair/BSC       Squat pivot transfers: Supervision     General transfer comment: Pt performed squat pivot transfer from bed to chair, chair to bed, and then back to chair. Supervision/SBA for safety. Pt able to demonstrate safe technique without LOB.    Ambulation/Gait               General Gait Details: Pt is non-ambulatory at baseline.   Stairs             Wheelchair Mobility    Modified Rankin (Stroke Patients Only)       Balance Overall balance assessment: Needs assistance Sitting-balance support: Feet supported, Single extremity supported Sitting balance-Leahy Scale: Fair Sitting balance - Comments: single LE support, tends to sit with 1-2 UE support for comfort.   Standing balance support: Bilateral upper extremity supported, Single extremity supported Standing balance-Leahy Scale: Poor                              Cognition Arousal/Alertness: Awake/alert Behavior During Therapy: Flat affect Overall Cognitive Status: Within Functional Limits for tasks assessed (basic mobility tasks)                                 General Comments: Pt A and O. Pt following commands appropriately.        Exercises General Exercises - Lower Extremity Quad Sets: Left, 10 reps, Supine Short Arc  Quad: Left, Supine, Other (comment) (12 reps) Hip ABduction/ADduction: Left, 10 reps, Supine (while holding SLR) Straight Leg Raises: Left, 10 reps, Supine    General Comments General comments (skin integrity, edema, etc.): VSS on RA      Pertinent Vitals/Pain      Home Living                          Prior Function            PT Goals (current goals can now be found in the care plan section) Acute Rehab PT Goals Patient Stated Goal: Get some sleep PT Goal Formulation: With  patient Time For Goal Achievement: 01/12/22 Potential to Achieve Goals: Good Progress towards PT goals: Progressing toward goals    Frequency    Min 3X/week      PT Plan Current plan remains appropriate    Co-evaluation              AM-PAC PT "6 Clicks" Mobility   Outcome Measure  Help needed turning from your back to your side while in a flat bed without using bedrails?: A Little Help needed moving from lying on your back to sitting on the side of a flat bed without using bedrails?: A Little Help needed moving to and from a bed to a chair (including a wheelchair)?: A Little Help needed standing up from a chair using your arms (e.g., wheelchair or bedside chair)?: A Little Help needed to walk in hospital room?: Total Help needed climbing 3-5 steps with a railing? : Total 6 Click Score: 14    End of Session   Activity Tolerance: Patient tolerated treatment well Patient left: with call bell/phone within reach;in chair Nurse Communication: Mobility status PT Visit Diagnosis: Other abnormalities of gait and mobility (R26.89)     Time: 9147-8295 PT Time Calculation (min) (ACUTE ONLY): 13 min  Charges:  $Therapeutic Exercise: 8-22 mins                     Donna Bernard, PT    [facility] 01/07/2022, 9:32 AM

## 2022-01-08 ENCOUNTER — Telehealth: Payer: Self-pay

## 2022-01-08 ENCOUNTER — Telehealth: Payer: Self-pay | Admitting: *Deleted

## 2022-01-08 LAB — HEPATITIS B SURFACE ANTIBODY,QUALITATIVE

## 2022-01-08 LAB — HEPATITIS B CORE ANTIBODY, TOTAL

## 2022-01-08 LAB — HEPATITIS B E ANTIBODY: Hep B E Ab: NEGATIVE

## 2022-01-08 NOTE — Telephone Encounter (Signed)
Transition Care Management Unsuccessful Follow-up Telephone Call  Date of discharge and from where:  01/07/2022, [facility]   Attempts:  1st Attempt  Reason for unsuccessful TCM follow-up call:  Voice mail full # [phone]

## 2022-01-08 NOTE — Patient Outreach (Signed)
  Care Coordination [facility] Note Transition Care Management Unsuccessful Follow-up Telephone Call  Date of discharge and from where:  12/11/21 from [facility]  Attempts:  3rd Attempt  Reason for unsuccessful TCM follow-up call:  No answer/busy  Chong Sicilian, BSN, RN-BC RN Care Coordinator [place]: [phone] Main #: [phone]

## 2022-01-09 ENCOUNTER — Other Ambulatory Visit: Payer: Self-pay

## 2022-01-09 ENCOUNTER — Inpatient Hospital Stay: Payer: Medicare Other | Attending: Internal Medicine

## 2022-01-09 ENCOUNTER — Telehealth: Payer: Self-pay

## 2022-01-09 ENCOUNTER — Other Ambulatory Visit: Payer: Medicare Other

## 2022-01-09 ENCOUNTER — Ambulatory Visit: Payer: Medicare Other

## 2022-01-09 ENCOUNTER — Inpatient Hospital Stay: Payer: Medicare Other

## 2022-01-09 VITALS — BP 154/79 | HR 62 | Temp 98.2°F | Resp 18

## 2022-01-09 DIAGNOSIS — K922 Gastrointestinal hemorrhage, unspecified: Secondary | ICD-10-CM | POA: Diagnosis present

## 2022-01-09 DIAGNOSIS — K921 Melena: Secondary | ICD-10-CM

## 2022-01-09 DIAGNOSIS — D509 Iron deficiency anemia, unspecified: Secondary | ICD-10-CM | POA: Insufficient documentation

## 2022-01-09 DIAGNOSIS — D500 Iron deficiency anemia secondary to blood loss (chronic): Secondary | ICD-10-CM | POA: Diagnosis present

## 2022-01-09 DIAGNOSIS — N186 End stage renal disease: Secondary | ICD-10-CM

## 2022-01-09 DIAGNOSIS — C220 Liver cell carcinoma: Secondary | ICD-10-CM

## 2022-01-09 DIAGNOSIS — K31819 Angiodysplasia of stomach and duodenum without bleeding: Secondary | ICD-10-CM

## 2022-01-09 LAB — CMP (CANCER CENTER ONLY)
ALT: 17 U/L (ref 0–44)
AST: 49 U/L — ABNORMAL HIGH (ref 15–41)
Albumin: 3 g/dL — ABNORMAL LOW (ref 3.5–5.0)
Alkaline Phosphatase: 122 U/L (ref 38–126)
Anion gap: 10 (ref 5–15)
BUN: 15 mg/dL (ref 8–23)
CO2: 32 mmol/L (ref 22–32)
Calcium: 8.9 mg/dL (ref 8.9–10.3)
Chloride: 97 mmol/L — ABNORMAL LOW (ref 98–111)
Creatinine: 3.82 mg/dL (ref 0.61–1.24)
GFR, Estimated: 17 mL/min — ABNORMAL LOW (ref >60)
Glucose, Bld: 115 mg/dL — ABNORMAL HIGH (ref 70–99)
Potassium: 3.5 mmol/L (ref 3.5–5.1)
Sodium: 139 mmol/L (ref 135–145)
Total Bilirubin: 0.9 mg/dL (ref 0.3–1.2)
Total Protein: 7.8 g/dL (ref 6.5–8.1)

## 2022-01-09 LAB — CBC WITH DIFFERENTIAL/PLATELET
Abs Immature Granulocytes: 0.01 10*3/uL (ref 0.00–0.07)
Basophils Absolute: 0 10*3/uL (ref 0.0–0.1)
Basophils Relative: 1 %
Eosinophils Absolute: 0 10*3/uL (ref 0.0–0.5)
Eosinophils Relative: 1 %
HCT: 26.6 % — ABNORMAL LOW (ref 39.0–52.0)
Hemoglobin: 9 g/dL — ABNORMAL LOW (ref 13.0–17.0)
Immature Granulocytes: 0 %
Lymphocytes Relative: 24 %
Lymphs Abs: 0.8 10*3/uL (ref 0.7–4.0)
MCH: 33.2 pg (ref 26.0–34.0)
MCHC: 33.8 g/dL (ref 30.0–36.0)
MCV: 98.2 fL (ref 80.0–100.0)
Monocytes Absolute: 0.7 10*3/uL (ref 0.1–1.0)
Monocytes Relative: 23 %
Neutro Abs: 1.6 10*3/uL — ABNORMAL LOW (ref 1.7–7.7)
Neutrophils Relative %: 51 %
Platelets: 81 10*3/uL — ABNORMAL LOW (ref 150–400)
RBC: 2.71 MIL/uL — ABNORMAL LOW (ref 4.22–5.81)
RDW: 18.5 % — ABNORMAL HIGH (ref 11.5–15.5)
WBC: 3.2 10*3/uL — ABNORMAL LOW (ref 4.0–10.5)
nRBC: 0 % (ref 0.0–0.2)

## 2022-01-09 LAB — FERRITIN: Ferritin: 727 ng/mL — ABNORMAL HIGH (ref 24–336)

## 2022-01-09 LAB — SAMPLE TO BLOOD BANK

## 2022-01-09 MED ORDER — SODIUM CHLORIDE 0.9 % IV SOLN
510.0000 mg | Freq: Once | INTRAVENOUS | Status: AC
  Administered 2022-01-09: 510 mg via INTRAVENOUS
  Filled 2022-01-09: qty 510

## 2022-01-09 NOTE — Patient Instructions (Signed)

## 2022-01-09 NOTE — Patient Outreach (Signed)
Daniel [name] 1960-12-30 [id number]  [facility] [ACO] Patient: Medicare ACO REACH  Referral Request:  [facility] Readmission Report  Patient's electronic medical record was accessed for report review for unplanned readmission.  Patient is active with [facility] NP-G noted.   Plan:  Review will be sent to Readmission Report.  For questions,  Natividad Brood, RN BSN [place]  [phone] business mobile phone Toll free office [phone]  *[place]  [phone] Fax number: [phone] [email or web address]

## 2022-01-09 NOTE — Telephone Encounter (Signed)
Transition Care Management Follow-up Telephone Call Date of discharge and from where: 01/07/2022, [facility]  How have you been since you were released from the hospital? He stated he is feeling good now, he just finished an iron infusion.  Any questions or concerns? Yes- he said he needs to learn how to use the LLE prosthesis and he is waiting to hear from the home health agency.   Items Reviewed: Did the pt receive and understand the discharge instructions provided? Yes  Medications obtained and verified? Yes - he said he has all of his medications.  Other? No  Any new allergies since your discharge? No  Dietary orders reviewed? No Do you have support at home?  Lives in a rooming house and said he takes care of himself  Home Care and Equipment/Supplies: Were home health services ordered? yes If so, what is the name of the agency? [facility]  Has the agency set up a time to come to the patient's home? no Were any new equipment or medical supplies ordered?  No What is the name of the medical supply agency? N/a Were you able to get the supplies/equipment? no Do you have any questions related to the use of the equipment or supplies? No  I sent an email to Elmarie Shiley to check on status of referral.   Functional Questionnaire: (I = Independent and D = Dependent) ADLs: uses his wheelchair for mobility.  Independent with personal care.   He attends HD: M/W/F at [facility]   Follow up appointments reviewed:  PCP Hospital f/u appt confirmed?  He just saw Ms Oletta Lamas, NP and said he did not need to schedule an appointment. He will call the clinic to schedule when he is ready.    [facility] f/u appt confirmed? Yes  Scheduled to see pulmonary - 01/10/2022, oncology- 02/06/2022.   Are transportation arrangements needed? No  If their condition worsens, is the pt aware to call PCP or go to the Emergency Dept.? Yes Was the patient provided with contact information for  the PCP's office or ED? Yes Was to pt encouraged to call back with questions or concerns? Yes

## 2022-01-10 ENCOUNTER — Telehealth: Payer: Self-pay | Admitting: *Deleted

## 2022-01-10 ENCOUNTER — Inpatient Hospital Stay: Payer: Medicare Other | Admitting: Adult Health

## 2022-01-10 NOTE — Patient Outreach (Signed)
  Care Coordination   Follow Up Visit Note   01/10/2022 Name: Daniel [name] MRN: [id number] DOB: [birth year]  Daniel [name] is a 61 y.o. year old male who sees [name], NP for primary care. I spoke with  Daniel [name] by phone today.  What matters to the patients health and wellness today?  I" need my electric wheelchair"  Advised pt that this is first time since we have been working together that he has mentioned this. Encouraged that his priority today should be to get everything in order after being in the hospital.   Goals Addressed             This Visit's Progress    Improve self management of chronic illnesses   Not on track    Care Coordination Interventions: Reviewed hospital discharge instructions. Confirmed new transportation service [place]... Encouraged ETOH abstinence, no driving for 6 months, fluid restriction, low na diet. Rescheduled pulmonology appt for Sept 19th at 10 AM Made call to schedule PCP appt.  Reinforced necessity to attend dialysis as scheduled to maintain life        SDOH assessments and interventions completed:  Yes  SDOH Interventions Today    Flowsheet Row Most Recent Value  SDOH Interventions   Transportation Interventions --  [Inpt LCSW arranged for new transportation service [place]...]        Care Coordination Interventions Activated:  Yes  Care Coordination Interventions:  Yes, provided   Follow up plan: Follow up call scheduled for 1 week.    Encounter Outcome:  Pt. Visit Completed   Kayleen Memos C. Myrtie Neither, MSN, [facility] Gerontological Nurse Practitioner [facility] Care Management [phone]

## 2022-01-13 ENCOUNTER — Telehealth: Payer: Self-pay | Admitting: *Deleted

## 2022-01-13 NOTE — Patient Outreach (Signed)
  Care Coordination   Follow Up Visit Note   01/13/2022 Name: Daniel [name] MRN: [id number] DOB: 29-Jun-1960  Daniel [name] is a 61 y.o. year old male who sees [name], NP for primary care. I  sent Mr. Ly the post hospitalization appt date and time per text today and requested an acknowledgement. Also reminded him of his pulmonology appt tomorrow.  SDOH assessments and interventions completed:  No     Care Coordination Interventions Activated:  Yes  Care Coordination Interventions:  Yes, provided   Follow up plan:  01/16/22    Encounter Outcome:  Pt. Visit Completed   Kayleen Memos C. Myrtie Neither, MSN, [facility] Gerontological Nurse Practitioner [facility] Care Management [phone]

## 2022-01-14 ENCOUNTER — Telehealth: Payer: Self-pay | Admitting: *Deleted

## 2022-01-14 ENCOUNTER — Telehealth (INDEPENDENT_AMBULATORY_CARE_PROVIDER_SITE_OTHER): Payer: Self-pay | Admitting: Primary Care

## 2022-01-14 ENCOUNTER — Inpatient Hospital Stay: Payer: Medicare Other | Admitting: Primary Care

## 2022-01-14 NOTE — Telephone Encounter (Signed)
Return call to [place] and gave verbal orders

## 2022-01-14 NOTE — Telephone Encounter (Signed)
FYI patient declined to schedule with [facility].

## 2022-01-14 NOTE — Telephone Encounter (Signed)
Daniel [name] this patient needs to know that the procedure that he needs is not available in [place] area, he would be best served following up with [facility] for the double balloon enteroscopy that he was referred for last month. I suspect without this he may continue to have bleeding / anemia that will bring him back to the hospital.  If he is still declining then can schedule a follow up visit with me or APP moving forward to discuss further with him, but we don't have capability to do the procedure he needs in our practice

## 2022-01-14 NOTE — Telephone Encounter (Signed)
Home Health Verbal Orders - Caller/Agency: [place] from [place] Number: [phone] Requesting OT and social worker Frequency: 1x6

## 2022-01-15 ENCOUNTER — Telehealth (INDEPENDENT_AMBULATORY_CARE_PROVIDER_SITE_OTHER): Payer: Self-pay | Admitting: Primary Care

## 2022-01-15 NOTE — Telephone Encounter (Signed)
Pt called back requesting an update on the phone number. Pt upset and disconnected the call.   Please advise.

## 2022-01-15 NOTE — Telephone Encounter (Signed)
Copied from [place] [id number]. Topic: General - Inquiry >> Jan 15, 2022 10:29 AM Daniel [name] wrote: Reason for CRM: Pt has called and wants to know when and the contact information on his wheelchair (Electric) was ordered as he is wanting to contact himself. FU to advise [phone]

## 2022-01-16 ENCOUNTER — Encounter: Payer: Self-pay | Admitting: *Deleted

## 2022-01-16 ENCOUNTER — Telehealth: Payer: Self-pay | Admitting: *Deleted

## 2022-01-16 NOTE — Telephone Encounter (Signed)
Looked in the fax folder and found pt order.  Contacted pt to give him this information pt states he has to find a pen and will call back for info.  If pt calls back order was sent to [employer] and their contact info is [phone]

## 2022-01-16 NOTE — Patient Outreach (Signed)
  Care Coordination   Follow Up Visit Note   01/16/2022 Name: Daniel [name] MRN: [id number] DOB: [birth year]  Daniel [name] is a 61 y.o. year old male who sees [name], NP for primary care. I spoke with  Daniel [name] by phone today.  What matters to the patients health and wellness today?  Getting an electric W/C.    Goals Addressed             This Visit's Progress    Improve self management of chronic illnesses   On track    Care Coordination Interventions: Reviewed appts, appears his pulmonology appt was canceled. His PCP visit is next Tuesday at 10:10 and reinforced the importance of him going to this post hospital assessment. Pt is 9 days post hospitalization. Pt advocated for himself by calling PCP and inquired about the electric W/C they were going to apply for. Pt was giiven information from PCP to be able to call and check on this himself. Reinforced self motivation and commitment to making it to appts and following his medical regimen.        SDOH assessments and interventions completed:  No   Care Coordination Interventions Activated:  Yes  Care Coordination Interventions:  Yes, provided   Follow up plan: Follow up call scheduled for 2 weeks    Encounter Outcome:  Pt. Visit Completed   Kayleen Memos C. Myrtie Neither, MSN, [facility] Gerontological Nurse Practitioner [facility] Care Management [phone]

## 2022-01-16 NOTE — Telephone Encounter (Signed)
Patient would like to know where PCP faxed orders for his electric wheelchair. Patient would like a call back today.

## 2022-01-17 ENCOUNTER — Telehealth: Payer: Self-pay

## 2022-01-17 NOTE — Telephone Encounter (Signed)
Returned call to Arbie Cookey and per Arbie Cookey she has been waiting for [name] to send over new office note with corrections for pt wheelchair. I apologize to [facility]. Per Arbie Cookey the Assessment needs to state power wheelchair instead of self proper wheelchair. Informed Arbie Cookey that I will have provider change note and will fax over new office note  Sharyn Lull has changed note and I have faxed over new note

## 2022-01-17 NOTE — Telephone Encounter (Signed)
Per Freddie Breech, RN/[facility], they were  able to reach the patient and resumed home health services 01/11/2022.

## 2022-01-17 NOTE — Telephone Encounter (Signed)
Arbie Cookey from [facility] called in requesting to speak with [name] and stated she has been waiting on correction chart notes for Daniel [name] to submit for his power chair since 01/03/2022.  Arbie Cookey stated she spoke with [name] about this and has not heard back from her. Arbie Cookey mentioned she let Daniel [name] know if she doesn't hear back from [employer] she will give him a call to follow up.  Please advise.

## 2022-01-20 ENCOUNTER — Other Ambulatory Visit: Payer: Self-pay

## 2022-01-20 ENCOUNTER — Emergency Department (HOSPITAL_COMMUNITY): Payer: Medicare Other

## 2022-01-20 ENCOUNTER — Encounter (HOSPITAL_COMMUNITY): Payer: Self-pay | Admitting: *Deleted

## 2022-01-20 ENCOUNTER — Emergency Department (HOSPITAL_COMMUNITY)
Admission: EM | Admit: 2022-01-20 | Discharge: 2022-01-20 | Discharge status: Against Medical Advice | Payer: Medicare Other | Attending: Student | Admitting: Student

## 2022-01-20 DIAGNOSIS — R1010 Upper abdominal pain, unspecified: Secondary | ICD-10-CM | POA: Insufficient documentation

## 2022-01-20 DIAGNOSIS — R197 Diarrhea, unspecified: Secondary | ICD-10-CM | POA: Insufficient documentation

## 2022-01-20 DIAGNOSIS — Z5321 Procedure and treatment not carried out due to patient leaving prior to being seen by health care provider: Secondary | ICD-10-CM | POA: Diagnosis not present

## 2022-01-20 DIAGNOSIS — Z20822 Contact with and (suspected) exposure to covid-19: Secondary | ICD-10-CM | POA: Diagnosis not present

## 2022-01-20 LAB — RESP PANEL BY RT-PCR (FLU A&B, COVID) ARPGX2
Influenza A by PCR: NEGATIVE
Influenza B by PCR: NEGATIVE
SARS Coronavirus 2 by RT PCR: NEGATIVE

## 2022-01-20 LAB — CBC WITH DIFFERENTIAL/PLATELET
Abs Immature Granulocytes: 0.01 10*3/uL (ref 0.00–0.07)
Basophils Absolute: 0 10*3/uL (ref 0.0–0.1)
Basophils Relative: 0 %
Eosinophils Absolute: 0.1 10*3/uL (ref 0.0–0.5)
Eosinophils Relative: 3 %
HCT: 28.2 % — ABNORMAL LOW (ref 39.0–52.0)
Hemoglobin: 8.9 g/dL — ABNORMAL LOW (ref 13.0–17.0)
Immature Granulocytes: 0 %
Lymphocytes Relative: 30 %
Lymphs Abs: 1 10*3/uL (ref 0.7–4.0)
MCH: 32.7 pg (ref 26.0–34.0)
MCHC: 31.6 g/dL (ref 30.0–36.0)
MCV: 103.7 fL — ABNORMAL HIGH (ref 80.0–100.0)
Monocytes Absolute: 0.5 10*3/uL (ref 0.1–1.0)
Monocytes Relative: 16 %
Neutro Abs: 1.6 10*3/uL — ABNORMAL LOW (ref 1.7–7.7)
Neutrophils Relative %: 51 %
Platelets: 95 10*3/uL — ABNORMAL LOW (ref 150–400)
RBC: 2.72 MIL/uL — ABNORMAL LOW (ref 4.22–5.81)
RDW: 18.5 % — ABNORMAL HIGH (ref 11.5–15.5)
WBC: 3.3 10*3/uL — ABNORMAL LOW (ref 4.0–10.5)
nRBC: 0 % (ref 0.0–0.2)

## 2022-01-20 LAB — COMPREHENSIVE METABOLIC PANEL
ALT: 17 U/L (ref 0–44)
AST: 48 U/L — ABNORMAL HIGH (ref 15–41)
Albumin: 2.2 g/dL — ABNORMAL LOW (ref 3.5–5.0)
Alkaline Phosphatase: 105 U/L (ref 38–126)
Anion gap: 11 (ref 5–15)
BUN: 28 mg/dL — ABNORMAL HIGH (ref 8–23)
CO2: 25 mmol/L (ref 22–32)
Calcium: 9 mg/dL (ref 8.9–10.3)
Chloride: 103 mmol/L (ref 98–111)
Creatinine, Ser: 7.11 mg/dL — ABNORMAL HIGH (ref 0.61–1.24)
GFR, Estimated: 8 mL/min — ABNORMAL LOW (ref >60)
Glucose, Bld: 99 mg/dL (ref 70–99)
Potassium: 4.4 mmol/L (ref 3.5–5.1)
Sodium: 139 mmol/L (ref 135–145)
Total Bilirubin: 0.8 mg/dL (ref 0.3–1.2)
Total Protein: 8.1 g/dL (ref 6.5–8.1)

## 2022-01-20 LAB — LIPASE, BLOOD: Lipase: 88 U/L — ABNORMAL HIGH (ref 11–51)

## 2022-01-20 LAB — MAGNESIUM: Magnesium: 2.3 mg/dL (ref 1.7–2.4)

## 2022-01-20 NOTE — ED Triage Notes (Signed)
Upper abdominal pain, right flank pain, diarrhea for 8 days.

## 2022-01-20 NOTE — ED Provider Triage Note (Signed)
Emergency Medicine Provider Triage Evaluation Note  [name] , a 61 y.o. male  was evaluated in triage.  Pt complains of generalized weakness, diarrhea x 8 days, and abdominal pain. HD patient with last session on Friday (M/W/F).   Review of Systems  Positive: D, abdoiminal pain Negative: N/V, chest pain shortness of breath  Physical Exam  BP (!) 162/79 (BP Location: Right Arm)   Pulse 82   Temp 98.2 F (36.8 C) (Oral)   Resp 16   SpO2 94%  Gen:   Awake, no distress   Resp:  Normal effort  MSK:   Moves extremities without difficulty  Other:  Abdomen generally tender to palpation without rebound or guarding.  Rales in the lung bases  Medical Decision Making  Medically screening exam initiated at 3:16 AM.  Appropriate orders placed.  Tayvin C Tomaso was informed that the remainder of the evaluation will be completed by another provider, this initial triage assessment does not replace that evaluation, and the importance of remaining in the ED until their evaluation is complete.  Work-up initiated  This chart was dictated using voice recognition software, Dragon. Despite the best efforts of this provider to proofread and correct errors, errors may still occur which can change documentation meaning.    Emeline Darling, PA-C 01/20/22 0327

## 2022-01-20 NOTE — ED Notes (Signed)
Patient transported to X-ray 

## 2022-01-20 NOTE — ED Notes (Signed)
Pt left and went to his regularly scheduled dialysis

## 2022-01-21 ENCOUNTER — Emergency Department (HOSPITAL_COMMUNITY)
Admission: EM | Admit: 2022-01-21 | Discharge: 2022-01-21 | Disposition: A | Discharge status: Home / Self Care | Payer: Medicare Other | Source: Home / Self Care | Attending: Emergency Medicine | Admitting: Emergency Medicine

## 2022-01-21 ENCOUNTER — Emergency Department (HOSPITAL_COMMUNITY)
Admission: EM | Admit: 2022-01-21 | Discharge: 2022-01-21 | Discharge status: Against Medical Advice | Payer: Medicare Other | Attending: Student | Admitting: Student

## 2022-01-21 ENCOUNTER — Encounter (HOSPITAL_COMMUNITY): Payer: Self-pay

## 2022-01-21 ENCOUNTER — Encounter (HOSPITAL_COMMUNITY): Payer: Self-pay | Admitting: Emergency Medicine

## 2022-01-21 ENCOUNTER — Emergency Department (HOSPITAL_COMMUNITY): Payer: Medicare Other

## 2022-01-21 ENCOUNTER — Inpatient Hospital Stay (INDEPENDENT_AMBULATORY_CARE_PROVIDER_SITE_OTHER): Payer: Medicare Other | Admitting: Primary Care

## 2022-01-21 ENCOUNTER — Other Ambulatory Visit: Payer: Self-pay

## 2022-01-21 DIAGNOSIS — K7460 Unspecified cirrhosis of liver: Secondary | ICD-10-CM | POA: Insufficient documentation

## 2022-01-21 DIAGNOSIS — R197 Diarrhea, unspecified: Secondary | ICD-10-CM | POA: Insufficient documentation

## 2022-01-21 DIAGNOSIS — Z992 Dependence on renal dialysis: Secondary | ICD-10-CM | POA: Insufficient documentation

## 2022-01-21 DIAGNOSIS — D72819 Decreased white blood cell count, unspecified: Secondary | ICD-10-CM | POA: Insufficient documentation

## 2022-01-21 DIAGNOSIS — R188 Other ascites: Secondary | ICD-10-CM | POA: Insufficient documentation

## 2022-01-21 DIAGNOSIS — Z79899 Other long term (current) drug therapy: Secondary | ICD-10-CM | POA: Insufficient documentation

## 2022-01-21 DIAGNOSIS — I120 Hypertensive chronic kidney disease with stage 5 chronic kidney disease or end stage renal disease: Secondary | ICD-10-CM | POA: Insufficient documentation

## 2022-01-21 DIAGNOSIS — E1122 Type 2 diabetes mellitus with diabetic chronic kidney disease: Secondary | ICD-10-CM | POA: Insufficient documentation

## 2022-01-21 DIAGNOSIS — N186 End stage renal disease: Secondary | ICD-10-CM | POA: Insufficient documentation

## 2022-01-21 DIAGNOSIS — Z5321 Procedure and treatment not carried out due to patient leaving prior to being seen by health care provider: Secondary | ICD-10-CM | POA: Diagnosis not present

## 2022-01-21 LAB — CBC WITH DIFFERENTIAL/PLATELET
Abs Immature Granulocytes: 0 10*3/uL (ref 0.00–0.07)
Abs Immature Granulocytes: 0 10*3/uL (ref 0.00–0.07)
Basophils Absolute: 0 10*3/uL (ref 0.0–0.1)
Basophils Absolute: 0 10*3/uL (ref 0.0–0.1)
Basophils Relative: 0 %
Basophils Relative: 0 %
Eosinophils Absolute: 0.1 10*3/uL (ref 0.0–0.5)
Eosinophils Absolute: 0.1 10*3/uL (ref 0.0–0.5)
Eosinophils Relative: 2 %
Eosinophils Relative: 2 %
HCT: 25.8 % — ABNORMAL LOW (ref 39.0–52.0)
HCT: 27.1 % — ABNORMAL LOW (ref 39.0–52.0)
Hemoglobin: 8.6 g/dL — ABNORMAL LOW (ref 13.0–17.0)
Hemoglobin: 8.8 g/dL — ABNORMAL LOW (ref 13.0–17.0)
Immature Granulocytes: 0 %
Immature Granulocytes: 0 %
Lymphocytes Relative: 35 %
Lymphocytes Relative: 34 %
Lymphs Abs: 1 10*3/uL (ref 0.7–4.0)
Lymphs Abs: 1 10*3/uL (ref 0.7–4.0)
MCH: 33.5 pg (ref 26.0–34.0)
MCH: 33.3 pg (ref 26.0–34.0)
MCHC: 33.3 g/dL (ref 30.0–36.0)
MCHC: 32.5 g/dL (ref 30.0–36.0)
MCV: 100.4 fL — ABNORMAL HIGH (ref 80.0–100.0)
MCV: 102.7 fL — ABNORMAL HIGH (ref 80.0–100.0)
Monocytes Absolute: 0.5 10*3/uL (ref 0.1–1.0)
Monocytes Absolute: 0.5 10*3/uL (ref 0.1–1.0)
Monocytes Relative: 17 %
Monocytes Relative: 19 %
Neutro Abs: 1.3 10*3/uL — ABNORMAL LOW (ref 1.7–7.7)
Neutro Abs: 1.2 10*3/uL — ABNORMAL LOW (ref 1.7–7.7)
Neutrophils Relative %: 46 %
Neutrophils Relative %: 45 %
Platelets: 107 10*3/uL — ABNORMAL LOW (ref 150–400)
Platelets: 102 10*3/uL — ABNORMAL LOW (ref 150–400)
RBC: 2.57 MIL/uL — ABNORMAL LOW (ref 4.22–5.81)
RBC: 2.64 MIL/uL — ABNORMAL LOW (ref 4.22–5.81)
RDW: 18.3 % — ABNORMAL HIGH (ref 11.5–15.5)
RDW: 18.3 % — ABNORMAL HIGH (ref 11.5–15.5)
WBC: 2.8 10*3/uL — ABNORMAL LOW (ref 4.0–10.5)
WBC: 2.8 10*3/uL — ABNORMAL LOW (ref 4.0–10.5)
nRBC: 0 % (ref 0.0–0.2)
nRBC: 0 % (ref 0.0–0.2)

## 2022-01-21 LAB — COMPREHENSIVE METABOLIC PANEL
ALT: 24 U/L (ref 0–44)
ALT: 24 U/L (ref 0–44)
AST: 78 U/L — ABNORMAL HIGH (ref 15–41)
AST: 82 U/L — ABNORMAL HIGH (ref 15–41)
Albumin: 2.2 g/dL — ABNORMAL LOW (ref 3.5–5.0)
Albumin: 2.3 g/dL — ABNORMAL LOW (ref 3.5–5.0)
Alkaline Phosphatase: 104 U/L (ref 38–126)
Alkaline Phosphatase: 105 U/L (ref 38–126)
Anion gap: 7 (ref 5–15)
Anion gap: 7 (ref 5–15)
BUN: 13 mg/dL (ref 8–23)
BUN: 19 mg/dL (ref 8–23)
CO2: 31 mmol/L (ref 22–32)
CO2: 30 mmol/L (ref 22–32)
Calcium: 8 mg/dL — ABNORMAL LOW (ref 8.9–10.3)
Calcium: 8.6 mg/dL — ABNORMAL LOW (ref 8.9–10.3)
Chloride: 100 mmol/L (ref 98–111)
Chloride: 99 mmol/L (ref 98–111)
Creatinine, Ser: 4.15 mg/dL — ABNORMAL HIGH (ref 0.61–1.24)
Creatinine, Ser: 4.75 mg/dL — ABNORMAL HIGH (ref 0.61–1.24)
GFR, Estimated: 15 mL/min — ABNORMAL LOW (ref >60)
GFR, Estimated: 13 mL/min — ABNORMAL LOW (ref >60)
Glucose, Bld: 98 mg/dL (ref 70–99)
Glucose, Bld: 113 mg/dL — ABNORMAL HIGH (ref 70–99)
Potassium: 3.3 mmol/L — ABNORMAL LOW (ref 3.5–5.1)
Potassium: 3.4 mmol/L — ABNORMAL LOW (ref 3.5–5.1)
Sodium: 138 mmol/L (ref 135–145)
Sodium: 136 mmol/L (ref 135–145)
Total Bilirubin: 0.7 mg/dL (ref 0.3–1.2)
Total Bilirubin: 1.1 mg/dL (ref 0.3–1.2)
Total Protein: 8 g/dL (ref 6.5–8.1)
Total Protein: 8 g/dL (ref 6.5–8.1)

## 2022-01-21 LAB — C DIFFICILE QUICK SCREEN W PCR REFLEX
C Diff antigen: NEGATIVE
C Diff interpretation: NOT DETECTED
C Diff toxin: NEGATIVE

## 2022-01-21 LAB — LIPASE, BLOOD
Lipase: 71 U/L — ABNORMAL HIGH (ref 11–51)
Lipase: 66 U/L — ABNORMAL HIGH (ref 11–51)

## 2022-01-21 MED ORDER — SODIUM CHLORIDE (PF) 0.9 % IJ SOLN
INTRAMUSCULAR | Status: AC
  Filled 2022-01-21: qty 50

## 2022-01-21 MED ORDER — LOPERAMIDE HCL 2 MG PO CAPS: 2.0000 mg | ORAL_CAPSULE | Freq: Four times a day (QID) | ORAL | 0 refills | Status: DC | PRN

## 2022-01-21 MED ORDER — LOPERAMIDE HCL 2 MG PO CAPS
4.0000 mg | ORAL_CAPSULE | Freq: Once | ORAL | Status: AC
  Administered 2022-01-21: 4 mg via ORAL
  Filled 2022-01-21: qty 2

## 2022-01-21 MED ORDER — IOHEXOL 300 MG/ML  SOLN
100.0000 mL | Freq: Once | INTRAMUSCULAR | Status: AC | PRN
  Administered 2022-01-21: 100 mL via INTRAVENOUS

## 2022-01-21 NOTE — ED Provider Triage Note (Signed)
Emergency Medicine Provider Triage Evaluation Note  Daniel [name] , a 61 y.o. male  was evaluated in triage.  Pt complains of diarrhea x9 days.  Also endorses some blood only when wiping.  Admits to diffuse abdominal pain. No fever. Denies vomiting  Review of Systems  Positive: Abdominal pain Negative: vomiting  Physical Exam  BP (!) 145/117 (BP Location: Left Arm)   Pulse 90   Temp 98.4 F (36.9 C) (Oral)   Resp 18   SpO2 96%  Gen:   Awake, no distress   Resp:  Normal effort  MSK:   Moves extremities without difficulty  Other:  Diffuse abdominal tenderness  Medical Decision Making  Medically screening exam initiated at 8:14 AM.  Appropriate orders placed.  Daniel [name] was informed that the remainder of the evaluation will be completed by another provider, this initial triage assessment does not replace that evaluation, and the importance of remaining in the ED until their evaluation is complete.  Labs, CT abdomen   Suzy Bouchard, [place] 01/21/22 [phone]

## 2022-01-21 NOTE — ED Notes (Signed)
Pt states he has an appointment at [facility] tomorrow at [place] so he is leaving.

## 2022-01-21 NOTE — ED Triage Notes (Signed)
Pt c/o diarrhea x 9 days, reports he attempted to be seen yesterday for same but had to leave AMA to make dialysis; pt reports he has not taken any OTC meds

## 2022-01-21 NOTE — ED Triage Notes (Signed)
Unsuccessful IV attempt by CT tech x2.  Pt placed back in lobby.

## 2022-01-21 NOTE — ED Provider Notes (Signed)
[facility] DEPT Provider Note   CSN: [id number] Arrival date & time: 01/21/22  0740    History  Chief Complaint  Patient presents with   Diarrhea    Daniel [name] is a 61 y.o. male ESRD, Monday Wednesday Friday, PUD, chronic anemia here for evaluation of diarrhea over the last 9 days.  Stool light brown in color.  To note that when he wiped on the toilet paper he saw some possible blood however he is not sure.  He has some generalized abdominal pain earlier which resolved. No current pain.  Last dialysis yesterday.  No recent antibiotics, travel.  No sick contacts, suspicious food intake.  No fever, nausea, vomiting, chest pain, shortness of breath. Not taking meds at home.    HPI     Home Medications Prior to Admission medications   Medication Sig Start Date End Date Taking? Authorizing Provider  loperamide (IMODIUM) 2 MG capsule Take 1 capsule (2 mg total) by mouth 4 (four) times daily as needed for diarrhea or loose stools. 01/21/22  Yes Doron Shake A, PA-C  amLODipine (NORVASC) 5 MG tablet Take 1 tablet (5 mg total) by mouth daily. 09/18/21 12/26/22  [place] Donnamarie Poag, DO  atorvastatin (LIPITOR) 40 MG tablet Take 1 tablet (40 mg total) by mouth daily. 09/18/21 12/26/22  [place] Donnamarie Poag, DO  folic acid (FOLVITE) 1 MG tablet Take 1 tablet (1 mg total) by mouth daily. 12/12/21   Eugenie Filler, MD  Multiple Vitamin (MULTIVITAMIN WITH MINERALS) TABS tablet Take 1 tablet by mouth daily. 12/12/21   Eugenie Filler, MD  pantoprazole (PROTONIX) 40 MG tablet Take 1 tablet (40 mg total) by mouth 2 (two) times daily before a meal. 10/10/21 01/08/22  Mercy Riding, MD  thiamine (VITAMIN B1) 100 MG tablet Take 1 tablet (100 mg total) by mouth daily. 12/12/21   Eugenie Filler, MD  traZODone (DESYREL) 50 MG tablet Take 1 tablet (50 mg total) by mouth at bedtime as needed for sleep. Patient taking differently: Take 50 mg by mouth daily as needed for sleep.  11/15/21   Nita Sells, MD  colchicine 0.6 MG tablet Take 0.5 tablets (0.3 mg total) by mouth 2 (two) times daily. 07/24/20 07/29/20  Noemi Chapel, MD  furosemide (LASIX) 40 MG tablet Take 1 tablet (40 mg total) by mouth daily. 07/25/19 02/09/20  Ladona Horns, MD      Allergies    Seroquel [quetiapine] and Dilaudid [hydromorphone hcl]    Review of Systems   Review of Systems  Constitutional: Negative.   HENT: Negative.    Respiratory: Negative.    Cardiovascular: Negative.   Gastrointestinal:  Positive for abdominal pain and diarrhea. Negative for abdominal distention, anal bleeding, blood in stool, constipation, nausea, rectal pain and vomiting.  Genitourinary: Negative.   Musculoskeletal: Negative.   Skin: Negative.   Neurological: Negative.   All other systems reviewed and are negative.   Physical Exam Updated Vital Signs BP (!) 165/93 (BP Location: Right Arm)   Pulse 85   Temp 97.9 F (36.6 C) (Oral)   Resp 18   SpO2 97%  Physical Exam Vitals and nursing note reviewed. Exam conducted with a chaperone present.  Constitutional:      General: He is not in acute distress.    Appearance: He is well-developed. He is not ill-appearing, toxic-appearing or diaphoretic.  HENT:     Head: Normocephalic and atraumatic.     Nose: Nose normal.  Mouth/Throat:     Mouth: Mucous membranes are moist.  Eyes:     Pupils: Pupils are equal, round, and reactive to light.  Cardiovascular:     Rate and Rhythm: Normal rate and regular rhythm.     Pulses: Normal pulses.     Heart sounds: Normal heart sounds.  Pulmonary:     Effort: Pulmonary effort is normal. No respiratory distress.     Breath sounds: Normal breath sounds.  Abdominal:     General: Bowel sounds are normal. There is no distension.     Palpations: Abdomen is soft. There is no mass.     Tenderness: There is no abdominal tenderness. There is no right CVA tenderness, left CVA tenderness, guarding or rebound.      Hernia: No hernia is present.     Comments: Soft, nontender  Genitourinary:    Rectum: Guaiac result negative. No tenderness.     Comments: Light brown stool in vault. Occult neg Musculoskeletal:        General: Normal range of motion.     Cervical back: Normal range of motion and neck supple.  Skin:    General: Skin is warm and dry.     Capillary Refill: Capillary refill takes less than 2 seconds.  Neurological:     General: No focal deficit present.     Mental Status: He is alert and oriented to person, place, and time.    ED Results / Procedures / Treatments   Labs (all labs ordered are listed, but only abnormal results are displayed) Labs Reviewed  CBC WITH DIFFERENTIAL/PLATELET - Abnormal; Notable for the following components:      Result Value   WBC 2.8 (*)    RBC 2.64 (*)    Hemoglobin 8.8 (*)    HCT 27.1 (*)    MCV 102.7 (*)    RDW 18.3 (*)    Platelets 102 (*)    Neutro Abs 1.2 (*)    All other components within normal limits  COMPREHENSIVE METABOLIC PANEL - Abnormal; Notable for the following components:   Potassium 3.4 (*)    Glucose, Bld 113 (*)    Creatinine, Ser 4.75 (*)    Calcium 8.6 (*)    Albumin 2.3 (*)    AST 82 (*)    GFR, Estimated 13 (*)    All other components within normal limits  LIPASE, BLOOD - Abnormal; Notable for the following components:   Lipase 66 (*)    All other components within normal limits  GASTROINTESTINAL PANEL BY PCR, STOOL (REPLACES STOOL CULTURE)  C DIFFICILE QUICK SCREEN W PCR REFLEX    POC OCCULT BLOOD, ED    EKG None  Radiology US Abdomen Limited RUQ (LIVER/GB)  Result Date: 01/21/2022 CLINICAL DATA:  Right upper quadrant pain. EXAM: ULTRASOUND ABDOMEN LIMITED RIGHT UPPER QUADRANT COMPARISON:  CT abdomen and pelvis 01/21/2022. FINDINGS: Gallbladder: Gallstones are present measuring up to 9 mm. Sonographic Percell Miller sign is negative. The gallbladder is contracted. Wall thickness measures 7.1 mm. Common bile duct:  Diameter: 2.8 mm Liver: No focal lesion identified. Nodular liver contour. Diffusely coarsened echotexture. There is a 4.3 x 4.1 x 4.0 cm heterogeneous mass in the right lobe of the liver. Portal vein is patent on color Doppler imaging with normal direction of blood flow towards the liver. Other: Right pleural effusion is present. Small amount of free fluid in the right upper quadrant. IMPRESSION: 1. Cholelithiasis. There is diffuse gallbladder wall edema. No biliary ductal dilatation or  pain identified. Findings may be related to reactive edema from hepatocellular disease versus acute/chronic cholecystitis. Please correlate clinically. 2. Findings suggestive of diffuse hepatocellular disease/cirrhosis. 3. Indeterminate lesion in the right lobe of the liver measuring 4.3 cm. This can be further evaluated with MRI. 4. Small amount of ascites. 5. Small right pleural effusion. Electronically Signed   By: Ronney Asters M.D.   On: 01/21/2022 19:28   CT ABDOMEN PELVIS W CONTRAST  Result Date: 01/21/2022 CLINICAL DATA:  LEFT lower quadrant abdominal pain, diarrhea for 9 days, blood when wiping, history end-stage renal disease on dialysis, diabetes mellitus, hypertension EXAM: CT ABDOMEN AND PELVIS WITH CONTRAST TECHNIQUE: Multidetector CT imaging of the abdomen and pelvis was performed using the standard protocol following bolus administration of intravenous contrast. RADIATION DOSE REDUCTION: This exam was performed according to the departmental dose-optimization program which includes automated exposure control, adjustment of the mA and/or kV according to patient size and/or use of iterative reconstruction technique. CONTRAST:  18m OMNIPAQUE IOHEXOL 300 MG/ML SOLN IV. No oral contrast. COMPARISON:  10/09/2021 FINDINGS: Lower chest: Small LEFT pleural effusion. Bibasilar atelectasis and RIGHT basilar infiltrate. 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 Slightly nodular hepatic contours question cirrhosis. Contracted gallbladder with gallstones and wall thickening with mild surrounding fluid, wall thickening of the gallbladder is a nonspecific finding in the setting of ascites. Pancreas: Normal appearance Spleen: Normal appearance.  Small splenule. Adrenals/Urinary Tract: Adrenal glands normal appearance. Cortical thinning of both kidneys consistent with end-stage renal disease. Tiny nonobstructing calculus LEFT kidney. No renal masses or hydronephrosis. Stomach/Bowel: Stable radiopacities within colon question ingested radiopaque foreign bodies versus radiodense medication. Stomach and remaining bowel loops normal appearance. Vascular/Lymphatic: Extensive atherosclerotic calcifications aorta, iliac arteries, coronary arteries, visceral arteries, small vessels. Aorta normal caliber. No adenopathy. Reproductive: Unremarkable prostate gland and seminal vesicles Other: Scattered ascites. Mild mesenteric edema. Mild infiltrative appearance of fat in the RIGHT mid abdomen, may be related to ascites, without discrete mass identified. No free air. Tiny umbilical hernia containing fat. Subcutaneous nodular opacity LEFT mid abdomen, nonspecific, could represent medication injection site, smaller similar foci seen elsewhere in anterior abdominal wall as well. Musculoskeletal: No focal abnormalities. IMPRESSION: Cirrhotic appearing liver with scattered ascites and mild mesenteric edema. 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 Cholelithiasis with wall thickening of the gallbladder, nonspecific in the setting of ascites. Small LEFT pleural effusion with bibasilar  atelectasis and RIGHT basilar infiltrate. Tiny umbilical hernia containing fat. Tiny nonobstructing LEFT renal calculus. Aortic Atherosclerosis (ICD10-I70.0). Electronically Signed   By: MLavonia DanaM.D.   On: 01/21/2022 17:55   DG Chest 2 View  Result Date: 01/20/2022 CLINICAL DATA:  61year old male with shortness of breath. EXAM: CHEST - 2 VIEW COMPARISON:  Portable chest 01/05/2022 and earlier. FINDINGS: Semi upright AP and lateral views at 0335 hours. Larger lung volumes and improved bilateral ventilation from earlier this month. Regressed and largely resolved indistinct bilateral perihilar opacity seen at that time. Cardiomegaly. Calcified aortic atherosclerosis. Stable cardiac size and mediastinal contours. Visualized tracheal air column is within normal limits. Decreased pleural effusions also, trace if any residual. Residual vascular congestion. No consolidation. No acute osseous abnormality identified. Negative  visible bowel gas. Left axillary vascular stent redemonstrated. IMPRESSION: 1. Regressed and largely resolved perihilar opacity which was probably pulmonary edema. Regressed pleural effusions, trace if any residual. 2. Stable cardiomegaly.  No new cardiopulmonary abnormality. Electronically Signed   By: Genevie Ann M.D.   On: 01/20/2022 04:15    Procedures Procedures    Medications Ordered in ED Medications  sodium chloride (PF) 0.9 % injection (has no administration in time range)  loperamide (IMODIUM) capsule 4 mg (has no administration in time range)  iohexol (OMNIPAQUE) 300 MG/ML solution 100 mL (100 mLs Intravenous Contrast Given 01/21/22 1718)    ED Course/ Medical Decision Making/ A&P     61 year old dialysis patient history of cirrhosis, currently being worked up for [facility], chronic anemia here for evaluation of diarrhea.  Typically has 2 loose stools daily however over the last 9 days has had 4-5.  No gross melena or bright per rectum.  Initially had some generalized abdominal  pain which is resolved.  He is tolerating p.o. intake.  Has been complaining his dialysis as scheduled, last yesterday he is due for tomorrow.  He does not make urine.  No chest pain or shortness of breath.  Work up started from [employer] and imaging personally viewed and interpreted:  P66, consistent with prior CBC leukopenia 2.8 consistent with prior, hemoglobin 8.8 at baseline Metabolic panel at baseline Occult negative, not crossing over into computer CT abdomen and pelvis with cirrhotic liver, scattered ascites, gallbladder wall edema however possibly due to ascites, nonspecific [place] RUQ with gallbladder wall edema, non specific. Cirrhosis  Patient reassessed.  On abdominal exam.  He denies any pain with p.o. intake, fever.  States he has not had any abdominal pain over the last week however has had diarrhea which is his main concern.  Did consider acute cholecystitis, symptomatic cholelithiasis however low suspicion for this.  Question SBP however has benign abdominal exam.  Low suspicion for this at this time.  CT without evidence of diverticulitis or any gross infectious process.  No obstruction.  Attempt to obtain stool here in the emergency department has not been able to provide 1 throughout his entire emergency department stay.  His occult was negative, low suspicion for acute GI bleed.  No symptoms to suggest PUD, active GI bleed at this time.  We will treat medically at this time.  I discussed if symptoms not improve he would likely need to follow-up with his primary care provider to provide stool sample to rule out infectious cause of diarrhea as well as return to emergency department if he develops any abdominal pain.  He is agreeable.  Tolerating p.o. intake here without difficulty.  The patient has been appropriately medically screened and/or stabilized in the ED. I have low suspicion for any other emergent medical condition which would require further screening, evaluation or  treatment in the ED or require inpatient management.  Patient is hemodynamically stable and in no acute distress.  Patient able to ambulate in department prior to ED.  Evaluation does not show acute pathology that would require ongoing or additional emergent interventions while in the emergency department or further inpatient treatment.  I have discussed the diagnosis with the patient and answered all questions.  Pain is been managed while in the emergency department and patient has no further complaints prior to discharge.  Patient is comfortable with plan discussed in room and is stable for discharge at this time.  I have discussed strict return precautions for returning  to the emergency department.  Patient was encouraged to follow-up with PCP/specialist refer to at discharge.                           Medical Decision Making Amount and/or Complexity of Data Reviewed External Data Reviewed: labs, radiology and notes. Labs: ordered. Decision-making details documented in ED Course. Radiology: ordered and independent interpretation performed. Decision-making details documented in ED Course.  Risk OTC drugs. Prescription drug management. Decision regarding hospitalization. Diagnosis or treatment significantly limited by social determinants of health.          Final Clinical Impression(s) / ED Diagnoses Final diagnoses:  Diarrhea, unspecified type  ESRD on dialysis [facility])    Rx / DC Orders ED Discharge Orders          Ordered    loperamide (IMODIUM) 2 MG capsule  4 times daily PRN        01/21/22 2029              Haley Fuerstenberg A, PA-C 01/21/22 2041    Ezequiel Essex, MD 01/22/22 [phone]

## 2022-01-21 NOTE — Discharge Instructions (Addendum)
May take the imodium as prescribed  Follow up with primary care provider  Return for worsening symptoms

## 2022-01-21 NOTE — ED Triage Notes (Signed)
Pt arrived via POV, c/o diarrhea, diffuse abd pain, worse with palpation.

## 2022-01-21 NOTE — ED Notes (Signed)
Needs IV for CT  

## 2022-01-22 ENCOUNTER — Telehealth (HOSPITAL_COMMUNITY): Payer: Self-pay

## 2022-01-22 LAB — GASTROINTESTINAL PANEL BY PCR, STOOL (REPLACES STOOL CULTURE)

## 2022-01-22 MED ORDER — AZITHROMYCIN 500 MG PO TABS: 500.0000 mg | ORAL_TABLET | Freq: Every day | ORAL | 0 refills | Status: DC

## 2022-01-23 ENCOUNTER — Inpatient Hospital Stay: Payer: Medicare Other

## 2022-01-24 ENCOUNTER — Other Ambulatory Visit: Payer: Self-pay

## 2022-01-24 ENCOUNTER — Emergency Department (HOSPITAL_COMMUNITY)
Admission: EM | Admit: 2022-01-24 | Discharge: 2022-01-25 | Disposition: A | Discharge status: Home / Self Care | Payer: Medicare Other | Attending: Emergency Medicine | Admitting: Emergency Medicine

## 2022-01-24 ENCOUNTER — Emergency Department (HOSPITAL_COMMUNITY): Payer: Medicare Other

## 2022-01-24 ENCOUNTER — Ambulatory Visit (INDEPENDENT_AMBULATORY_CARE_PROVIDER_SITE_OTHER): Payer: Self-pay

## 2022-01-24 DIAGNOSIS — R1030 Lower abdominal pain, unspecified: Secondary | ICD-10-CM | POA: Diagnosis present

## 2022-01-24 DIAGNOSIS — R531 Weakness: Secondary | ICD-10-CM | POA: Insufficient documentation

## 2022-01-24 DIAGNOSIS — E1122 Type 2 diabetes mellitus with diabetic chronic kidney disease: Secondary | ICD-10-CM | POA: Insufficient documentation

## 2022-01-24 DIAGNOSIS — Z992 Dependence on renal dialysis: Secondary | ICD-10-CM | POA: Insufficient documentation

## 2022-01-24 DIAGNOSIS — J810 Acute pulmonary edema: Secondary | ICD-10-CM | POA: Insufficient documentation

## 2022-01-24 DIAGNOSIS — A045 Campylobacter enteritis: Secondary | ICD-10-CM | POA: Diagnosis not present

## 2022-01-24 DIAGNOSIS — N185 Chronic kidney disease, stage 5: Secondary | ICD-10-CM | POA: Insufficient documentation

## 2022-01-24 DIAGNOSIS — I120 Hypertensive chronic kidney disease with stage 5 chronic kidney disease or end stage renal disease: Secondary | ICD-10-CM | POA: Diagnosis not present

## 2022-01-24 DIAGNOSIS — Z79899 Other long term (current) drug therapy: Secondary | ICD-10-CM | POA: Insufficient documentation

## 2022-01-24 LAB — COMPREHENSIVE METABOLIC PANEL
ALT: 30 U/L (ref 0–44)
AST: 90 U/L — ABNORMAL HIGH (ref 15–41)
Albumin: 2.2 g/dL — ABNORMAL LOW (ref 3.5–5.0)
Alkaline Phosphatase: 112 U/L (ref 38–126)
Anion gap: 14 (ref 5–15)
BUN: 41 mg/dL — ABNORMAL HIGH (ref 8–23)
CO2: 22 mmol/L (ref 22–32)
Calcium: 8.4 mg/dL — ABNORMAL LOW (ref 8.9–10.3)
Chloride: 102 mmol/L (ref 98–111)
Creatinine, Ser: 9.11 mg/dL — ABNORMAL HIGH (ref 0.61–1.24)
GFR, Estimated: 6 mL/min — ABNORMAL LOW (ref >60)
Glucose, Bld: 132 mg/dL — ABNORMAL HIGH (ref 70–99)
Potassium: 4.1 mmol/L (ref 3.5–5.1)
Sodium: 138 mmol/L (ref 135–145)
Total Bilirubin: 0.9 mg/dL (ref 0.3–1.2)
Total Protein: 8.1 g/dL (ref 6.5–8.1)

## 2022-01-24 LAB — CBC WITH DIFFERENTIAL/PLATELET
Abs Immature Granulocytes: 0.02 10*3/uL (ref 0.00–0.07)
Basophils Absolute: 0 10*3/uL (ref 0.0–0.1)
Basophils Relative: 1 %
Eosinophils Absolute: 0.1 10*3/uL (ref 0.0–0.5)
Eosinophils Relative: 2 %
HCT: 27.1 % — ABNORMAL LOW (ref 39.0–52.0)
Hemoglobin: 9.1 g/dL — ABNORMAL LOW (ref 13.0–17.0)
Immature Granulocytes: 1 %
Lymphocytes Relative: 26 %
Lymphs Abs: 0.9 10*3/uL (ref 0.7–4.0)
MCH: 34.2 pg — ABNORMAL HIGH (ref 26.0–34.0)
MCHC: 33.6 g/dL (ref 30.0–36.0)
MCV: 101.9 fL — ABNORMAL HIGH (ref 80.0–100.0)
Monocytes Absolute: 0.5 10*3/uL (ref 0.1–1.0)
Monocytes Relative: 15 %
Neutro Abs: 1.8 10*3/uL (ref 1.7–7.7)
Neutrophils Relative %: 55 %
Platelets: 112 10*3/uL — ABNORMAL LOW (ref 150–400)
RBC: 2.66 MIL/uL — ABNORMAL LOW (ref 4.22–5.81)
RDW: 18.3 % — ABNORMAL HIGH (ref 11.5–15.5)
WBC: 3.3 10*3/uL — ABNORMAL LOW (ref 4.0–10.5)
nRBC: 0 % (ref 0.0–0.2)

## 2022-01-24 LAB — LIPASE, BLOOD: Lipase: 78 U/L — ABNORMAL HIGH (ref 11–51)

## 2022-01-24 NOTE — ED Triage Notes (Addendum)
Pt here from home via [facility] for generalized weakness since Wednesday. Pt missed dialysis Wednesday and Friday, pt usually goes MWF. Pt c/o lower abd pain. Pt denies cp/sob/dizziness. 141/85, 88HR, 96% RA, cbg 170. Pt was diagnosed w/ campylobacter infection 3 days ago

## 2022-01-24 NOTE — ED Provider Triage Note (Signed)
Emergency Medicine Provider Triage Evaluation Note  Daniel [name] , a 61 y.o. male  was evaluated in triage.  Pt complains of weakness.  Patient seen recently for diarrhea.  He was diagnosed with Campylobacter infection.  Patient is a dialysis patient.  He has not had dialysis since this past Monday due to weakness and constant diarrhea.  He complains of abdominal pain..  Feeling short of breath  Review of Systems  Positive: Diarrhea and abdominal pain, weakness Negative: Fever  Physical Exam  BP (!) 148/88 (BP Location: Right Arm)   Pulse 94   Temp 98.7 F (37.1 C) (Oral)   Resp 20   SpO2 94%  Gen:   Awake, no distress   Resp:  Normal effort  MSK:   Moves extremities without difficulty  Other:  Abdomen is exquisitely tender  Medical Decision Making  Medically screening exam initiated at 4:29 PM.  Appropriate orders placed.  Alonte C Ghosh was informed that the remainder of the evaluation will be completed by another provider, this initial triage assessment does not replace that evaluation, and the importance of remaining in the ED until their evaluation is complete.  Work-up initiated   Margarita Mail, PA-C 01/25/22 1436

## 2022-01-24 NOTE — Telephone Encounter (Signed)
Campylobacter species  Chief Complaint: positive for Campylobacter species, bad pain , rectal bleeding and black tarry stool with bright red blood on it Symptoms: tarry stools, mid to upper abd pain, dizziness, bright red blood on BM, difficulty  breathing  Frequency: just discharged from Hospital 3 days ago Pertinent Negatives:Disposition: '[x]'$ ED /'[]'$ Urgent Care (no appt availability in office) / '[]'$ Appointment(In office/virtual)/ '[]'$  [facility]/ '[]'$ Home Care/ '[]'$ Refused Recommended Disposition /'[]'$ [place]/ '[]'$  Follow-up with PCP Additional Notes: pt calling to get appt and start tx for intestinal infection- additional information pt has upper abdomeinal pain, rectal bleeding, tarry stools, dizziness, difficulty breathing. Called 911 and and stayed on phone with pt until paramedics arrived.   Reason for Disposition  Black or tarry bowel movements  (Exception: Chronic-unchanged black-grey BMs AND is taking iron pills or Pepto-Bismol.)  Blood in bowel movements  (Exception: Blood on surface of BM with constipation.)  Answer Assessment - Initial Assessment Questions 1. LOCATION: "Where does it hurt?"      Mid abdomen 2. RADIATION: "Does the pain shoot anywhere else?" (e.g., chest, back)     back 3. ONSET: "When did the pain begin?" (Minutes, hours or days ago)      N/a 4. SUDDEN: "Gradual or sudden onset?"     N/a 5. PATTERN "Does the pain come and go, or is it constant?"    - If it comes and goes: "How long does it last?" "Do you have pain now?"     (Note: Comes and goes means the pain is intermittent. It goes away completely between bouts.)    - If constant: "Is it getting better, staying the same, or getting worse?"      (Note: Constant means the pain never goes away completely; most serious pain is constant and gets worse.)      *No Answer* 6. SEVERITY: "How bad is the pain?"  (e.g., Scale 1-10; mild, moderate, or severe)    - MILD (1-3): Doesn't interfere with  normal activities, abdomen soft and not tender to touch.     - MODERATE (4-7): Interferes with normal activities or awakens from sleep, abdomen tender to touch.     - SEVERE (8-10): Excruciating pain, doubled over, unable to do any normal activities.       ? mild 7. RECURRENT SYMPTOM: "Have you ever had this type of stomach pain before?" If Yes, ask: "When was the last time?" and "What happened that time?"      N/a 8. CAUSE: "What do you think is causing the stomach pain?"     N/a 9. RELIEVING/AGGRAVATING FACTORS: "What makes it better or worse?" (e.g., antacids, bending or twisting motion, bowel movement)     /a 10. OTHER SYMPTOMS: "Do you have any other symptoms?" (e.g., back pain, diarrhea, fever, urination pain, vomiting)       Dizziness, , back pain, tar colored stools  Protocols used: Abdominal Pain - Male-A-AH

## 2022-01-25 DIAGNOSIS — A045 Campylobacter enteritis: Secondary | ICD-10-CM | POA: Diagnosis not present

## 2022-01-25 MED ORDER — AZITHROMYCIN 500 MG PO TABS: 500.0000 mg | ORAL_TABLET | Freq: Every day | ORAL | 0 refills | Status: AC

## 2022-01-25 MED ORDER — AZITHROMYCIN 250 MG PO TABS
500.0000 mg | ORAL_TABLET | Freq: Once | ORAL | Status: AC
  Administered 2022-01-25: 500 mg via ORAL
  Filled 2022-01-25: qty 2

## 2022-01-25 NOTE — ED Provider Notes (Addendum)
[facility] EMERGENCY DEPARTMENT Provider Note   CSN: [id number] Arrival date & time: 01/24/22  1620     History  Chief Complaint  Patient presents with   Weakness   Diarrhea    Daniel [name] is a 61 y.o. male.   Weakness Associated symptoms: diarrhea   Diarrhea    Patient has history of multiple medical problems including hypertension, intracerebral hemorrhage, chronic kidney disease, diabetes.  Patient initially presented to the ED yesterday for trouble with increasing weakness since Wednesday.  Patient usually goes to dialysis Monday Wednesday Friday.  He did not go to dialysis on Wednesday or Friday.  Patient also has been having trouble with lower abdominal discomfort.  He was diagnosed with Campylobacter infection earlier this week.  He was seen in the emergency room on September 26 and at that time had a CT scan of his abdomen and pelvis that showed evidence of cirrhosis with scattered ascites and mild mesenteric edema.  He did have cholelithiasis.  He had an ultrasound that suggested gallbladder wall edema that could be related to reactive edema versus hepatocellular disease.  Patient's presentation was not felt to be related to cholecystitis.  Patient had stool studies that were positive for Campylobacter.  Patient states he has noticed a small amount of blood in the stool with wiping.  No dark stools.  He has not been feeling weakness or lightheadedness.  He has noted some increasing difficulty with his breathing over the last couple of days.  Home Medications Prior to Admission medications   Medication Sig Start Date End Date Taking? Authorizing Provider  azithromycin (ZITHROMAX) 500 MG tablet Take 1 tablet (500 mg total) by mouth daily for 2 days. Take 1 tablet per day for the next two days.  Start on 10/1 01/25/22 01/27/22 Yes Dorie Rank, MD  amLODipine (NORVASC) 5 MG tablet Take 1 tablet (5 mg total) by mouth daily. 09/18/21 12/26/22  [place] Donnamarie Poag, DO   atorvastatin (LIPITOR) 40 MG tablet Take 1 tablet (40 mg total) by mouth daily. 09/18/21 12/26/22  [place] Donnamarie Poag, DO  folic acid (FOLVITE) 1 MG tablet Take 1 tablet (1 mg total) by mouth daily. 12/12/21   Eugenie Filler, MD  loperamide (IMODIUM) 2 MG capsule Take 1 capsule (2 mg total) by mouth 4 (four) times daily as needed for diarrhea or loose stools. 01/21/22   Henderly, Britni A, PA-C  Multiple Vitamin (MULTIVITAMIN WITH MINERALS) TABS tablet Take 1 tablet by mouth daily. 12/12/21   Eugenie Filler, MD  pantoprazole (PROTONIX) 40 MG tablet Take 1 tablet (40 mg total) by mouth 2 (two) times daily before a meal. 10/10/21 01/08/22  Mercy Riding, MD  thiamine (VITAMIN B1) 100 MG tablet Take 1 tablet (100 mg total) by mouth daily. 12/12/21   Eugenie Filler, MD  traZODone (DESYREL) 50 MG tablet Take 1 tablet (50 mg total) by mouth at bedtime as needed for sleep. Patient taking differently: Take 50 mg by mouth daily as needed for sleep. 11/15/21   Nita Sells, MD  colchicine 0.6 MG tablet Take 0.5 tablets (0.3 mg total) by mouth 2 (two) times daily. 07/24/20 07/29/20  Noemi Chapel, MD  furosemide (LASIX) 40 MG tablet Take 1 tablet (40 mg total) by mouth daily. 07/25/19 02/09/20  Ladona Horns, MD      Allergies    Seroquel [quetiapine] and Dilaudid [hydromorphone hcl]    Review of Systems   Review of Systems  Gastrointestinal:  Positive for  diarrhea.  Neurological:  Positive for weakness.    Physical Exam Updated Vital Signs BP (!) 158/91   Pulse 82   Temp 97.7 F (36.5 C)   Resp 20   SpO2 98%  Physical Exam  ED Results / Procedures / Treatments   Labs (all labs ordered are listed, but only abnormal results are displayed) Labs Reviewed  CBC WITH DIFFERENTIAL/PLATELET - Abnormal; Notable for the following components:      Result Value   WBC 3.3 (*)    RBC 2.66 (*)    Hemoglobin 9.1 (*)    HCT 27.1 (*)    MCV 101.9 (*)    MCH 34.2 (*)    RDW 18.3 (*)    Platelets  112 (*)    All other components within normal limits  COMPREHENSIVE METABOLIC PANEL - Abnormal; Notable for the following components:   Glucose, Bld 132 (*)    BUN 41 (*)    Creatinine, Ser 9.11 (*)    Calcium 8.4 (*)    Albumin 2.2 (*)    AST 90 (*)    GFR, Estimated 6 (*)    All other components within normal limits  LIPASE, BLOOD - Abnormal; Notable for the following components:   Lipase 78 (*)    All other components within normal limits  I-STAT CHEM 8, ED    EKG None  Radiology DG ABD ACUTE 2+V W 1V CHEST  Result Date: 01/24/2022 CLINICAL DATA:  Generalized weakness since Wednesday. Abdominal pain. EXAM: DG ABDOMEN ACUTE WITH 1 VIEW CHEST COMPARISON:  Chest radiograph 01/20/2022 FINDINGS: Increased opacities in the right perihilar region. Slightly increased interstitial densities in the mid left lung. Again noted are densities at the right lung base compatible with atelectasis and pleural fluid. Right pleural effusion may have slightly enlarged. Heart size is upper limits of normal but stable. Atherosclerotic calcifications at the aortic arch. Again noted is a vascular stent in the left axilla. Nonobstructive bowel gas pattern. No evidence for free air. Extensive vascular calcifications in the pelvis. IMPRESSION: 1. Increased opacities in the right perihilar region and slightly increased interstitial densities in the mid left lung. Findings are concerning for pulmonary edema. 2. Persistent densities at the right lung base are compatible with pleural fluid and atelectasis. Right pleural effusion may have slightly enlarged. 3. Nonobstructive bowel gas pattern. Electronically Signed   By: Markus Daft M.D.   On: 01/24/2022 17:23    Procedures Procedures    Medications Ordered in ED Medications  azithromycin (ZITHROMAX) tablet 500 mg (has no administration in time range)    ED Course/ Medical Decision Making/ A&P Clinical Course as of 01/25/22 1042  Sat Jan 25, 2022  0912 DG ABD  ACUTE 2+V W 1V CHEST No obstructive findings on acute abdominal series, chest x-ray portion suggest pulmonary edema [JK]  0912 Comprehensive metabolic panel(!) Creatinine elevated since last tracing [JK]  0913 CBC with Differential(!) Hemoglobin stable [JK]  0913 Lipase, blood(!) Lipase slightly elevated, unchanged compared to previous [JK]  1005 Case discussed with Dr. Joelyn Oms nephrology.   [JK]    Clinical Course User Index [JK] Dorie Rank, MD                           Medical Decision Making Problems Addressed: Acute pulmonary edema [facility]): acute illness or injury Campylobacter diarrhea: acute illness or injury Chronic renal failure, stage 5 ([place]): chronic illness or injury with exacerbation, progression, or side effects of  treatment  Amount and/or Complexity of Data Reviewed Labs:  Decision-making details documented in ED Course. Radiology: independent interpretation performed. Decision-making details documented in ED Course. Discussion of management or test interpretation with external provider(s): Patient reviewed with nephrology Dr. Joelyn Oms  Risk Prescription drug management.   Patient presented to the ED for reevaluation of persistent diarrhea and abdominal discomfort.  Patient was instructed to return to the ED because he has noticed some blood in his stools.  Recent ED evaluation reviewed.  Patient had extensive testing including CT scans and ultrasound.  Patient still has some mild abdominal discomfort.  He is nontoxic and afebrile.  No significant leukocytosis.  I do not feel that he requires repeat abdominal imaging.  Patient has noticed small amount of blood in his stools but denies any significant bleeding.  There is no drop in his hemoglobin.  This is likely related to the Campylobacter infection.  Patient will be started on a course of azithromycin.    Does have end-stage renal failure and has missed 2 days of dialysis.  His next scheduled dialysis was not be until  Monday.  Not requiring oxygen but he is developing pulmonary edema I will consult with nephrology to see about arranging for dialysis today  I discussed case with Dr. Joelyn Oms nephrology.  Patient will go to his outpatient dialysis center today.  They will be able to see him around 1130.  Patient does not have an oxygen requirement.  He is not tachypneic.  He is not having any respiratory difficulty.  He does appear appropriate for outpatient dialysis management.  Evaluation and diagnostic testing in the emergency department does not suggest an emergent condition requiring admission or immediate intervention beyond what has been performed at this time.  The patient is safe for discharge and has been instructed to return immediately for worsening symptoms, change in symptoms or any other concerns.          Final Clinical Impression(s) / ED Diagnoses Final diagnoses:  Chronic renal failure, stage 5 (HCC)  Acute pulmonary edema (HCC)  Campylobacter diarrhea    Rx / DC Orders ED Discharge Orders          Ordered    azithromycin (ZITHROMAX) 500 MG tablet  Daily        01/25/22 1040              Dorie Rank, MD 01/25/22 1042 Arrangements were made to have the patient transported to his outpatient dialysis center as patient indicated he did not have transport.   Dorie Rank, MD 01/25/22 1136

## 2022-01-25 NOTE — ED Notes (Signed)
Pt refused to go to the [facility] dialysis center from the ED d/t his inability to find transport for him to get home from there. EDP aware.

## 2022-01-25 NOTE — Discharge Instructions (Signed)
Take the antibiotic as prescribed for your diarrhea.  You will be taking 3 days of azithromycin.  Follow-up with your dialysis center today.  They should be able to take you at around 1130.

## 2022-01-26 LAB — FUNGAL ORGANISM REFLEX

## 2022-01-26 LAB — FUNGUS CULTURE WITH STAIN

## 2022-01-26 LAB — FUNGUS CULTURE RESULT

## 2022-01-27 NOTE — Telephone Encounter (Signed)
Pt went to the ED on 9/29

## 2022-01-29 NOTE — Telephone Encounter (Signed)
Patient states he is no longer having any problems and he is unable to go all the way to [facility].

## 2022-01-30 ENCOUNTER — Inpatient Hospital Stay (INDEPENDENT_AMBULATORY_CARE_PROVIDER_SITE_OTHER): Payer: Medicare Other | Admitting: Primary Care

## 2022-01-30 ENCOUNTER — Emergency Department (HOSPITAL_COMMUNITY)
Admission: EM | Admit: 2022-01-30 | Discharge: 2022-01-30 | Disposition: A | Discharge status: Home / Self Care | Payer: Medicare Other | Attending: Emergency Medicine | Admitting: Emergency Medicine

## 2022-01-30 ENCOUNTER — Encounter (HOSPITAL_COMMUNITY): Payer: Self-pay | Admitting: Emergency Medicine

## 2022-01-30 ENCOUNTER — Emergency Department (HOSPITAL_COMMUNITY)
Admission: EM | Admit: 2022-01-30 | Discharge: 2022-01-30 | Discharge status: Against Medical Advice | Payer: Medicare Other | Attending: Student | Admitting: Student

## 2022-01-30 ENCOUNTER — Other Ambulatory Visit: Payer: Self-pay

## 2022-01-30 DIAGNOSIS — R197 Diarrhea, unspecified: Secondary | ICD-10-CM | POA: Insufficient documentation

## 2022-01-30 DIAGNOSIS — Z5321 Procedure and treatment not carried out due to patient leaving prior to being seen by health care provider: Secondary | ICD-10-CM | POA: Insufficient documentation

## 2022-01-30 DIAGNOSIS — R1084 Generalized abdominal pain: Secondary | ICD-10-CM | POA: Insufficient documentation

## 2022-01-30 DIAGNOSIS — R109 Unspecified abdominal pain: Secondary | ICD-10-CM | POA: Insufficient documentation

## 2022-01-30 DIAGNOSIS — Z992 Dependence on renal dialysis: Secondary | ICD-10-CM | POA: Diagnosis not present

## 2022-01-30 LAB — CBC WITH DIFFERENTIAL/PLATELET
Abs Immature Granulocytes: 0.02 10*3/uL (ref 0.00–0.07)
Basophils Absolute: 0 10*3/uL (ref 0.0–0.1)
Basophils Relative: 1 %
Eosinophils Absolute: 0 10*3/uL (ref 0.0–0.5)
Eosinophils Relative: 1 %
HCT: 26.5 % — ABNORMAL LOW (ref 39.0–52.0)
Hemoglobin: 8.9 g/dL — ABNORMAL LOW (ref 13.0–17.0)
Immature Granulocytes: 1 %
Lymphocytes Relative: 25 %
Lymphs Abs: 1.1 10*3/uL (ref 0.7–4.0)
MCH: 33.7 pg (ref 26.0–34.0)
MCHC: 33.6 g/dL (ref 30.0–36.0)
MCV: 100.4 fL — ABNORMAL HIGH (ref 80.0–100.0)
Monocytes Absolute: 0.8 10*3/uL (ref 0.1–1.0)
Monocytes Relative: 17 %
Neutro Abs: 2.4 10*3/uL (ref 1.7–7.7)
Neutrophils Relative %: 55 %
Platelets: 110 10*3/uL — ABNORMAL LOW (ref 150–400)
RBC: 2.64 MIL/uL — ABNORMAL LOW (ref 4.22–5.81)
RDW: 17.5 % — ABNORMAL HIGH (ref 11.5–15.5)
WBC: 4.3 10*3/uL (ref 4.0–10.5)
nRBC: 0 % (ref 0.0–0.2)

## 2022-01-30 LAB — COMPREHENSIVE METABOLIC PANEL
ALT: 40 U/L (ref 0–44)
AST: 104 U/L — ABNORMAL HIGH (ref 15–41)
Albumin: 2.2 g/dL — ABNORMAL LOW (ref 3.5–5.0)
Alkaline Phosphatase: 108 U/L (ref 38–126)
Anion gap: 11 (ref 5–15)
BUN: 28 mg/dL — ABNORMAL HIGH (ref 8–23)
CO2: 27 mmol/L (ref 22–32)
Calcium: 8.3 mg/dL — ABNORMAL LOW (ref 8.9–10.3)
Chloride: 96 mmol/L — ABNORMAL LOW (ref 98–111)
Creatinine, Ser: 7.6 mg/dL — ABNORMAL HIGH (ref 0.61–1.24)
GFR, Estimated: 7 mL/min — ABNORMAL LOW (ref >60)
Glucose, Bld: 118 mg/dL — ABNORMAL HIGH (ref 70–99)
Potassium: 4.1 mmol/L (ref 3.5–5.1)
Sodium: 134 mmol/L — ABNORMAL LOW (ref 135–145)
Total Bilirubin: 0.9 mg/dL (ref 0.3–1.2)
Total Protein: 8.2 g/dL — ABNORMAL HIGH (ref 6.5–8.1)

## 2022-01-30 NOTE — ED Triage Notes (Addendum)
Pt. Stated, Ive had bouts of diarrhea for 3 weeks and stomach pain . I go about 10 times a day. Pt here last night and left AMA but had labs drawn.

## 2022-01-30 NOTE — ED Triage Notes (Signed)
Patient reports chronic diarrhea for several weeks . Hemodialysis q Mon/Wed/Fri.

## 2022-01-30 NOTE — ED Notes (Signed)
Patient left, he stated he was going home.

## 2022-01-30 NOTE — ED Provider Triage Note (Signed)
Emergency Medicine Provider Triage Evaluation Note  Daniel [name] , a 61 y.o. male  was evaluated in triage.  Pt complains of on going diarrhea for the last several weeks.  Recently diagnosed with Campylobacter infection stool at the end of September and treated with azithromycin in the outpatient setting per patient.  States there have been no improvement in his diarrhea still with 6 episodes daily, denies melena but endorses some bright red blood at time of wiping.  Generalized abdominal discomfort.  Last hemodialysis session today..  Review of Systems  Positive: As above Negative: Chest pain shortness of breath syncope  Physical Exam  BP 132/79 (BP Location: Right Arm)   Pulse 99   Temp 99.8 F (37.7 C) (Oral)   Resp 16   SpO2 100%  Gen:   Awake, no distress   Resp:  Normal effort  MSK:   Moves extremities without difficulty  Other:  Generalized abdominal discomfort on palpation without focality  Medical Decision Making  Medically screening exam initiated at 1:54 AM.  Appropriate orders placed.  Daniel [name] was informed that the remainder of the evaluation will be completed by another provider, this initial triage assessment does not replace that evaluation, and the importance of remaining in the ED until their evaluation is complete.  This chart was dictated using voice recognition software, Dragon. Despite the best efforts of this provider to proofread and correct errors, errors may still occur which can change documentation meaning.    Emeline Darling, PA-C 01/30/22 0201

## 2022-01-31 ENCOUNTER — Telehealth (INDEPENDENT_AMBULATORY_CARE_PROVIDER_SITE_OTHER): Payer: Self-pay | Admitting: Primary Care

## 2022-01-31 ENCOUNTER — Other Ambulatory Visit: Payer: Self-pay

## 2022-01-31 ENCOUNTER — Emergency Department (HOSPITAL_COMMUNITY)
Admission: EM | Admit: 2022-01-31 | Discharge: 2022-01-31 | Disposition: A | Discharge status: Home / Self Care | Payer: Medicare Other | Attending: Emergency Medicine | Admitting: Emergency Medicine

## 2022-01-31 ENCOUNTER — Encounter (HOSPITAL_COMMUNITY): Payer: Self-pay | Admitting: Emergency Medicine

## 2022-01-31 ENCOUNTER — Emergency Department (HOSPITAL_COMMUNITY): Payer: Medicare Other

## 2022-01-31 DIAGNOSIS — Z992 Dependence on renal dialysis: Secondary | ICD-10-CM | POA: Insufficient documentation

## 2022-01-31 DIAGNOSIS — I120 Hypertensive chronic kidney disease with stage 5 chronic kidney disease or end stage renal disease: Secondary | ICD-10-CM | POA: Insufficient documentation

## 2022-01-31 DIAGNOSIS — K769 Liver disease, unspecified: Secondary | ICD-10-CM

## 2022-01-31 DIAGNOSIS — N186 End stage renal disease: Secondary | ICD-10-CM | POA: Insufficient documentation

## 2022-01-31 DIAGNOSIS — R197 Diarrhea, unspecified: Secondary | ICD-10-CM | POA: Diagnosis present

## 2022-01-31 DIAGNOSIS — R1013 Epigastric pain: Secondary | ICD-10-CM

## 2022-01-31 DIAGNOSIS — E1122 Type 2 diabetes mellitus with diabetic chronic kidney disease: Secondary | ICD-10-CM | POA: Insufficient documentation

## 2022-01-31 DIAGNOSIS — Z79899 Other long term (current) drug therapy: Secondary | ICD-10-CM | POA: Diagnosis not present

## 2022-01-31 DIAGNOSIS — K7689 Other specified diseases of liver: Secondary | ICD-10-CM | POA: Insufficient documentation

## 2022-01-31 LAB — CBC WITH DIFFERENTIAL/PLATELET
Abs Immature Granulocytes: 0.01 10*3/uL (ref 0.00–0.07)
Basophils Absolute: 0 10*3/uL (ref 0.0–0.1)
Basophils Relative: 1 %
Eosinophils Absolute: 0.1 10*3/uL (ref 0.0–0.5)
Eosinophils Relative: 2 %
HCT: 28.4 % — ABNORMAL LOW (ref 39.0–52.0)
Hemoglobin: 9.1 g/dL — ABNORMAL LOW (ref 13.0–17.0)
Immature Granulocytes: 0 %
Lymphocytes Relative: 24 %
Lymphs Abs: 1.1 10*3/uL (ref 0.7–4.0)
MCH: 32.7 pg (ref 26.0–34.0)
MCHC: 32 g/dL (ref 30.0–36.0)
MCV: 102.2 fL — ABNORMAL HIGH (ref 80.0–100.0)
Monocytes Absolute: 0.9 10*3/uL (ref 0.1–1.0)
Monocytes Relative: 20 %
Neutro Abs: 2.3 10*3/uL (ref 1.7–7.7)
Neutrophils Relative %: 53 %
Platelets: 95 10*3/uL — ABNORMAL LOW (ref 150–400)
RBC: 2.78 MIL/uL — ABNORMAL LOW (ref 4.22–5.81)
RDW: 17.1 % — ABNORMAL HIGH (ref 11.5–15.5)
WBC: 4.3 10*3/uL (ref 4.0–10.5)
nRBC: 0 % (ref 0.0–0.2)

## 2022-01-31 LAB — C DIFFICILE QUICK SCREEN W PCR REFLEX
C Diff antigen: NEGATIVE
C Diff interpretation: NOT DETECTED
C Diff toxin: NEGATIVE

## 2022-01-31 LAB — BASIC METABOLIC PANEL
Anion gap: 13 (ref 5–15)
BUN: 37 mg/dL — ABNORMAL HIGH (ref 8–23)
CO2: 23 mmol/L (ref 22–32)
Calcium: 8.5 mg/dL — ABNORMAL LOW (ref 8.9–10.3)
Chloride: 99 mmol/L (ref 98–111)
Creatinine, Ser: 8.93 mg/dL — ABNORMAL HIGH (ref 0.61–1.24)
GFR, Estimated: 6 mL/min — ABNORMAL LOW (ref >60)
Glucose, Bld: 96 mg/dL (ref 70–99)
Potassium: 4.4 mmol/L (ref 3.5–5.1)
Sodium: 135 mmol/L (ref 135–145)

## 2022-01-31 MED ORDER — LOPERAMIDE HCL 2 MG PO CAPS
2.0000 mg | ORAL_CAPSULE | Freq: Once | ORAL | Status: AC
  Administered 2022-01-31: 2 mg via ORAL
  Filled 2022-01-31: qty 1

## 2022-01-31 MED ORDER — LOPERAMIDE HCL 2 MG PO CAPS: 2.0000 mg | ORAL_CAPSULE | Freq: Four times a day (QID) | ORAL | 0 refills | Status: DC | PRN

## 2022-01-31 NOTE — ED Notes (Signed)
Pt did not answer for bed assignment.

## 2022-01-31 NOTE — ED Provider Triage Note (Signed)
Emergency Medicine Provider Triage Evaluation Note  Daniel [name] , a 61 y.o. male  was evaluated in triage.  Pt complains of consistent diarrhea for the last month.  Reports coming to the ED several times for this complaint however states "they didn't see me".  Notices some spots of blood after he wipes.  Stool described as light brown, watery.  Denies blood in stool or dark tarry stools.  Without N/V, abdominal pain, fever, chills, or headache.  Dialysis MWF, supposed to go today, however came to the ED to be seen for diarrhea instead.  Not on anticoagulation.  Review of Systems  Positive:  Negative: See above  Physical Exam  BP (!) 154/93 (BP Location: Right Arm)   Pulse 98   Temp 98.9 F (37.2 C) (Oral)   Resp 18   Ht '5\' 11"'$  (1.803 m)   Wt 108.9 kg   SpO2 91%   BMI 33.47 kg/m  Gen:   Awake, no distress   Resp:  Normal effort  MSK:   Moves extremities without difficulty  Other:  Abdomen with minimal to mild tenderness.  Medical Decision Making  Medically screening exam initiated at 10:21 AM.  Appropriate orders placed.  Alphonsus C Bennetts was informed that the remainder of the evaluation will be completed by another provider, this initial triage assessment does not replace that evaluation, and the importance of remaining in the ED until their evaluation is complete.     Prince Rome, PA-C 62/13/08 1028

## 2022-01-31 NOTE — ED Triage Notes (Signed)
Patient c/o diarrhea for month. States he has been here twice for same c/o. Supposed to go to [facility] for further evaluation but was told to come here instead.

## 2022-01-31 NOTE — ED Provider Notes (Signed)
[facility] EMERGENCY DEPARTMENT Provider Note   CSN: [id number] Arrival date & time: 01/31/22  0935     History  Chief Complaint  Patient presents with   Diarrhea    Daniel [name] is a 61 y.o. male.  HPI Patient presents with concern of loose stool.  He has had loose stool frequently, 4 weeks, has been seen, evaluated, here, with his nephrologist, and at other facilities.  Patient is on dialysis, last session was 2 days ago, and today did not go due to ongoing upper abdominal pain, frequent loose stool.  No fever, chest pain, dyspnea.  No recent change in medication, diet, activity.  In reviewing the patient's chart, is known to have some hepatic dysfunction, concern for hepatocellular carcinoma.    Home Medications Prior to Admission medications   Medication Sig Start Date End Date Taking? Authorizing Provider  loperamide (IMODIUM) 2 MG capsule Take 1 capsule (2 mg total) by mouth 4 (four) times daily as needed for diarrhea or loose stools. 01/31/22  Yes Carmin Muskrat, MD  amLODipine (NORVASC) 5 MG tablet Take 1 tablet (5 mg total) by mouth daily. 09/18/21 12/26/22  [place] Donnamarie Poag, DO  atorvastatin (LIPITOR) 40 MG tablet Take 1 tablet (40 mg total) by mouth daily. 09/18/21 12/26/22  [place] Donnamarie Poag, DO  folic acid (FOLVITE) 1 MG tablet Take 1 tablet (1 mg total) by mouth daily. 12/12/21   Eugenie Filler, MD  loperamide (IMODIUM) 2 MG capsule Take 1 capsule (2 mg total) by mouth 4 (four) times daily as needed for diarrhea or loose stools. 01/21/22   Henderly, Britni A, PA-C  Multiple Vitamin (MULTIVITAMIN WITH MINERALS) TABS tablet Take 1 tablet by mouth daily. 12/12/21   Eugenie Filler, MD  pantoprazole (PROTONIX) 40 MG tablet Take 1 tablet (40 mg total) by mouth 2 (two) times daily before a meal. 10/10/21 01/08/22  Mercy Riding, MD  thiamine (VITAMIN B1) 100 MG tablet Take 1 tablet (100 mg total) by mouth daily. 12/12/21   Eugenie Filler, MD  traZODone  (DESYREL) 50 MG tablet Take 1 tablet (50 mg total) by mouth at bedtime as needed for sleep. Patient taking differently: Take 50 mg by mouth daily as needed for sleep. 11/15/21   Nita Sells, MD  colchicine 0.6 MG tablet Take 0.5 tablets (0.3 mg total) by mouth 2 (two) times daily. 07/24/20 07/29/20  Noemi Chapel, MD  furosemide (LASIX) 40 MG tablet Take 1 tablet (40 mg total) by mouth daily. 07/25/19 02/09/20  Ladona Horns, MD      Allergies    Seroquel [quetiapine] and Dilaudid [hydromorphone hcl]    Review of Systems   Review of Systems  All other systems reviewed and are negative.   Physical Exam Updated Vital Signs BP (!) 169/100 (BP Location: Right Arm)   Pulse 91   Temp 98.1 F (36.7 C) (Oral)   Resp 16   Ht '5\' 11"'$  (1.803 m)   Wt 108.9 kg   SpO2 99%   BMI 33.47 kg/m  Physical Exam Vitals and nursing note reviewed.  Constitutional:      General: He is not in acute distress.    Appearance: He is well-developed.  HENT:     Head: Normocephalic and atraumatic.  Eyes:     Conjunctiva/sclera: Conjunctivae normal.  Cardiovascular:     Rate and Rhythm: Normal rate and regular rhythm.  Pulmonary:     Effort: Pulmonary effort is normal. No respiratory distress.  Breath sounds: No stridor.  Abdominal:     General: There is no distension.    Musculoskeletal:     Comments: Left AV fistula unremarkable.  Left BKA.  Skin:    General: Skin is warm and dry.  Neurological:     Mental Status: He is alert and oriented to person, place, and time.     ED Results / Procedures / Treatments   Labs (all labs ordered are listed, but only abnormal results are displayed) Labs Reviewed  BASIC METABOLIC PANEL - Abnormal; Notable for the following components:      Result Value   BUN 37 (*)    Creatinine, Ser 8.93 (*)    Calcium 8.5 (*)    GFR, Estimated 6 (*)    All other components within normal limits  CBC WITH DIFFERENTIAL/PLATELET - Abnormal; Notable for the following  components:   RBC 2.78 (*)    Hemoglobin 9.1 (*)    HCT 28.4 (*)    MCV 102.2 (*)    RDW 17.1 (*)    Platelets 95 (*)    All other components within normal limits  C DIFFICILE QUICK SCREEN W PCR REFLEX    GASTROINTESTINAL PANEL BY PCR, STOOL (REPLACES STOOL CULTURE)    EKG None  Radiology CT ABDOMEN PELVIS WO CONTRAST  Result Date: 01/31/2022 CLINICAL DATA:  Abdominal pain and diarrhea. Fever. Cirrhosis. Hepatocellular carcinoma. * Tracking Code: BO * EXAM: CT ABDOMEN AND PELVIS WITHOUT CONTRAST TECHNIQUE: Multidetector CT imaging of the abdomen and pelvis was performed following the standard protocol without IV contrast. RADIATION DOSE REDUCTION: This exam was performed according to the departmental dose-optimization program which includes automated exposure control, adjustment of the mA and/or kV according to patient size and/or use of iterative reconstruction technique. COMPARISON:  01/21/2022 FINDINGS: Lower chest: Bilateral lower lobe airspace disease again seen, as well as small right pleural effusion. Hepatobiliary: Hepatic cirrhosis is again demonstrated. 4.2 x 4.2 cm mass in the central liver shows no significant change, consistent with known hepatocellular carcinoma. Moderate ascites and diffuse mesenteric edema shows no significant change. A few tiny calcified gallstones are seen, however there is no evidence of acute cholecystitis or biliary ductal dilatation. Pancreas: No mass or inflammatory process visualized on this unenhanced exam. Spleen:  Within normal limits in size. Adrenals/Urinary tract: 2 mm calculus again noted in lower pole of left kidney. No evidence of ureteral calculi or hydronephrosis. Unremarkable unopacified urinary bladder. Stomach/Bowel: No evidence of obstruction, inflammatory process, or abnormal fluid collections. Vascular/Lymphatic: No pathologically enlarged lymph nodes identified. No evidence of abdominal aortic aneurysm. Aortic atherosclerotic calcification  incidentally noted. Reproductive:  No mass or other significant abnormality. Other:  None. Musculoskeletal:  No suspicious bone lesions identified. IMPRESSION: Hepatic cirrhosis. Stable 4.2 cm mass in central liver, consistent with known hepatocellular carcinoma. Stable moderate ascites and diffuse mesenteric edema. Cholelithiasis. No radiographic evidence of acute cholecystitis. Tiny left renal calculus. No evidence of ureteral calculi or hydronephrosis. No significant change in bilateral lower lobe airspace disease and small right pleural effusion. Electronically Signed   By: Marlaine Hind M.D.   On: 01/31/2022 19:15    Procedures Procedures    Medications Ordered in ED Medications  loperamide (IMODIUM) capsule 2 mg (has no administration in time range)    ED Course/ Medical Decision Making/ A&P This patient with a Hx of multiple medical issues including end-stage renal disease, hepatic dysfunction, hypertension, diabetes presents to the ED for concern of abdominal pain, diarrhea, this involves an extensive  number of treatment options, and is a complaint that carries with it a high risk of complications and morbidity.    The differential diagnosis includes dialysis related GI symptoms, intra-abdominal infection, IBS, bacteremia, sepsis   Social Determinants of Health:  Age, prior amputation, dialysis  Additional history obtained:  Additional history and/or information obtained from chart review, notable for 2 ED visits within the past 2 weeks, for similar concerns, and on reviewing his chart, the patient has been seen by GI with concern for ongoing evaluation of possible hepatocellular carcinoma   After the initial evaluation, orders, including: CT labs monitoring were initiated.   Patient placed on Cardiac and Pulse-Oximetry Monitors. The patient was maintained on a cardiac monitor.  The cardiac monitored showed an rhythm of 90 sinus normal The patient was also maintained on pulse  oximetry. The readings were typically 100% room air normal   On repeat evaluation of the patient stayed the same  Lab Tests:  I personally interpreted labs.  The pertinent results include: Creatinine 8.9, potassium normal  Imaging Studies ordered:  I independently visualized and interpreted imaging which showed CT abdomen pelvis with lesion in the liver concerning for hepatocellular carcinoma I agree with the radiologist interpretation   Dispostion / Final MDM:  After consideration of the diagnostic results and the patient's response to treatment, patient is awake, alert, speaking clearly is presenting with weeks of loose stool.  Here he is afebrile, not hypotensive, no leukocytosis, little evidence for bacteremia, sepsis.  Clostridium difficile test negative, GI panel sent.  Some suspicion for dialysis related diarrhea versus hepatic dysfunction/malignancy.  On reviewing labs patient is unaware of any ongoing evaluation for his cancer, is amenable to following up with his gastroenterologist for this conversation, initiation of Imodium for symptom relief.  Absent other alarming findings, the patient started on this medication, discharged in stable condition though hospitalization was a consideration given his advanced age and multiple medical problems.  Final Clinical Impression(s) / ED Diagnoses Final diagnoses:  Epigastric pain  Hepatic lesion  Diarrhea, unspecified type    Rx / DC Orders ED Discharge Orders          Ordered    loperamide (IMODIUM) 2 MG capsule  4 times daily PRN        01/31/22 2031              Carmin Muskrat, MD 01/31/22 2032

## 2022-01-31 NOTE — Telephone Encounter (Signed)
Copied from [place] [id number]. Topic: General - Other >> Jan 30, 2022  3:09 PM Ja-Kwan M wrote: Reason for CRM: Elmyra Ricks with [facility] reports she was unable to complete PT with patient today due to patient having elevated blood (190/100), patient complained of extreme pain in upper right quadrant, slight fever of 99.5, and significant diarrhea and upset stomach.  Cb# [phone]

## 2022-01-31 NOTE — ED Notes (Signed)
Discharge instructions reviewed with patient. Patient verbalized understanding of instructions. Follow-up care and medications were reviewed. Patient ambulatory with steady gait. VSS upon discharge.  ?

## 2022-01-31 NOTE — Discharge Instructions (Signed)
As discussed, it is very reported that you follow-up with both your nephrologist and your gastroenterologist.  Please take your newly prescribed medication as directed and discuss this with your physicians on follow-up.  Return here for concerning changes in your condition.

## 2022-02-01 LAB — GASTROINTESTINAL PANEL BY PCR, STOOL (REPLACES STOOL CULTURE)

## 2022-02-03 ENCOUNTER — Telehealth: Payer: Self-pay

## 2022-02-03 NOTE — Telephone Encounter (Signed)
Called and spoke with patient. To set up appt. Pt requested an appt on Tuesday or Thursday due to dialysis schedule. He has been scheduled for a hospital follow up with Ellouise Newer, PA-C on Thursday, 03/06/22 at 8:30 am. Patient is aware that I will mail him his appt information, pt confirmed address on file. Pt verbalized understanding and had no concerns at the end of the call.

## 2022-02-03 NOTE — Telephone Encounter (Signed)
        Patient  visited [name] on 10/6     Telephone encounter attempt :  1st  A HIPAA compliant voice message was left requesting a return call.  Instructed patient to call back   Daniel [name] [facility] Care Guide, [facility], Care Management  [phone] [address], [place], [place] [address] Phone: [phone] Email: [email or web address]       

## 2022-02-03 NOTE — Telephone Encounter (Signed)
Yetta Flock, MD  Yevette Edwards, RN Daniel [name] this patient needs a clinic follow up with me or APP in the next few weeks if you can help coordinate. Thanks

## 2022-02-06 ENCOUNTER — Inpatient Hospital Stay: Payer: Medicare Other | Attending: Internal Medicine | Admitting: Hematology

## 2022-02-06 ENCOUNTER — Inpatient Hospital Stay: Payer: Medicare Other

## 2022-02-06 ENCOUNTER — Telehealth (INDEPENDENT_AMBULATORY_CARE_PROVIDER_SITE_OTHER): Payer: Self-pay | Admitting: Primary Care

## 2022-02-06 ENCOUNTER — Encounter: Payer: Self-pay | Admitting: Hematology

## 2022-02-06 ENCOUNTER — Other Ambulatory Visit (INDEPENDENT_AMBULATORY_CARE_PROVIDER_SITE_OTHER): Payer: Self-pay | Admitting: Primary Care

## 2022-02-06 ENCOUNTER — Other Ambulatory Visit: Payer: Self-pay

## 2022-02-06 VITALS — BP 156/95 | HR 88 | Resp 16

## 2022-02-06 VITALS — BP 131/80 | HR 86 | Temp 97.8°F | Resp 15

## 2022-02-06 DIAGNOSIS — C220 Liver cell carcinoma: Secondary | ICD-10-CM

## 2022-02-06 DIAGNOSIS — D500 Iron deficiency anemia secondary to blood loss (chronic): Secondary | ICD-10-CM | POA: Diagnosis present

## 2022-02-06 DIAGNOSIS — K922 Gastrointestinal hemorrhage, unspecified: Secondary | ICD-10-CM | POA: Insufficient documentation

## 2022-02-06 DIAGNOSIS — N186 End stage renal disease: Secondary | ICD-10-CM | POA: Diagnosis not present

## 2022-02-06 DIAGNOSIS — I10 Essential (primary) hypertension: Secondary | ICD-10-CM

## 2022-02-06 DIAGNOSIS — K921 Melena: Secondary | ICD-10-CM

## 2022-02-06 DIAGNOSIS — D631 Anemia in chronic kidney disease: Secondary | ICD-10-CM

## 2022-02-06 DIAGNOSIS — K31819 Angiodysplasia of stomach and duodenum without bleeding: Secondary | ICD-10-CM

## 2022-02-06 DIAGNOSIS — Z992 Dependence on renal dialysis: Secondary | ICD-10-CM | POA: Diagnosis not present

## 2022-02-06 DIAGNOSIS — G4700 Insomnia, unspecified: Secondary | ICD-10-CM

## 2022-02-06 LAB — FERRITIN: Ferritin: 830 ng/mL — ABNORMAL HIGH (ref 24–336)

## 2022-02-06 LAB — CMP (CANCER CENTER ONLY)
ALT: 24 U/L (ref 0–44)
AST: 70 U/L — ABNORMAL HIGH (ref 15–41)
Albumin: 2.6 g/dL — ABNORMAL LOW (ref 3.5–5.0)
Alkaline Phosphatase: 125 U/L (ref 38–126)
Anion gap: 8 (ref 5–15)
BUN: 25 mg/dL — ABNORMAL HIGH (ref 8–23)
CO2: 29 mmol/L (ref 22–32)
Calcium: 8.5 mg/dL — ABNORMAL LOW (ref 8.9–10.3)
Chloride: 95 mmol/L — ABNORMAL LOW (ref 98–111)
Creatinine: 6.6 mg/dL (ref 0.61–1.24)
GFR, Estimated: 9 mL/min — ABNORMAL LOW (ref >60)
Glucose, Bld: 99 mg/dL (ref 70–99)
Potassium: 4.2 mmol/L (ref 3.5–5.1)
Sodium: 132 mmol/L — ABNORMAL LOW (ref 135–145)
Total Bilirubin: 1 mg/dL (ref 0.3–1.2)
Total Protein: 7.9 g/dL (ref 6.5–8.1)

## 2022-02-06 LAB — CBC WITH DIFFERENTIAL/PLATELET
Abs Immature Granulocytes: 0.01 10*3/uL (ref 0.00–0.07)
Basophils Absolute: 0 10*3/uL (ref 0.0–0.1)
Basophils Relative: 1 %
Eosinophils Absolute: 0.1 10*3/uL (ref 0.0–0.5)
Eosinophils Relative: 2 %
HCT: 25.3 % — ABNORMAL LOW (ref 39.0–52.0)
Hemoglobin: 8.4 g/dL — ABNORMAL LOW (ref 13.0–17.0)
Immature Granulocytes: 0 %
Lymphocytes Relative: 27 %
Lymphs Abs: 1 10*3/uL (ref 0.7–4.0)
MCH: 32.7 pg (ref 26.0–34.0)
MCHC: 33.2 g/dL (ref 30.0–36.0)
MCV: 98.4 fL (ref 80.0–100.0)
Monocytes Absolute: 0.6 10*3/uL (ref 0.1–1.0)
Monocytes Relative: 17 %
Neutro Abs: 2 10*3/uL (ref 1.7–7.7)
Neutrophils Relative %: 53 %
Platelets: 95 10*3/uL — ABNORMAL LOW (ref 150–400)
RBC: 2.57 MIL/uL — ABNORMAL LOW (ref 4.22–5.81)
RDW: 15.7 % — ABNORMAL HIGH (ref 11.5–15.5)
WBC: 3.8 10*3/uL — ABNORMAL LOW (ref 4.0–10.5)
nRBC: 0 % (ref 0.0–0.2)

## 2022-02-06 LAB — SAMPLE TO BLOOD BANK

## 2022-02-06 MED ORDER — SODIUM CHLORIDE 0.9 % IV SOLN
510.0000 mg | Freq: Once | INTRAVENOUS | Status: AC
  Administered 2022-02-06: 510 mg via INTRAVENOUS
  Filled 2022-02-06: qty 510

## 2022-02-06 NOTE — Telephone Encounter (Signed)
Copied from [place] [phone]. Topic: General - Other >> Feb 06, 2022  1:35 PM Ja-Kwan M wrote: Reason for CRM: Colletta Maryland with [facility] reports that pt had a missed OT visit and it will be rescheduled later during certification period. Cb# [phone]

## 2022-02-06 NOTE — Progress Notes (Signed)
[place]   Telephone:([phone]) [phone] Fax:([phone]) [phone]   Clinic Follow up Note   Patient Care Team: [name], NP as PCP - General (Internal Medicine) Lorretta Harp, MD as PCP - Cardiology (Cardiology) Center, [facility], NP as [place], Rosanne Ashing, MD as Consulting Physician (Interventional Radiology)  Date of Service:  02/06/2022  CHIEF COMPLAINT: f/u of [place], anemia  CURRENT THERAPY:  IV iron as needed -s/p Y90 11/28/21  ASSESSMENT & PLAN:  Daniel [name] is a 61 y.o. male with   1. Hepatocellular Carcinoma, cT2N0M0, stage II -non-hypermetabolic liver lesion incidentally seen on PET on 07/23/21 for lung nodule. No metabolic adenopathy. -presented to ED on 08/05/21 with recurrent tarry black stool and severe anemia, subsequently admitted. CT angio AP showed a large 5CM central liver lesion, further evaluated with MRI on 08/07/21 showing: 5 cm mass centered in segment 4A; adjacent 2 cm mass in right hepatic lobe (indeterminate); no adenopathy or distant metastasis. The imaging finding was consistent with [place], liver biopsy not felt to be necessary for diagnosis of [place]. -baseline AFP on 08/07/21 was elevated at 13.9. -he was in and out of the hospital following diagnosis due to anemia and GI bleed.  -he received one dose of sandostatin on 10/08/21 -due to transportation issues and several ED visits/hospitalizations, his Y90 procedure was delayed and finally performed on 11/28/21.  -recent CT AP w/o contrast in ED on 01/31/22 showed stable 4.2 cm central liver mass. -No plan for systemic therapy for now  -labs reviewed, hgb 8.4 today, will proceed with IV feraheme as scheduled.   2. 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 -has required numerous hospital admission and blood transfusions. -he received one dose beva on  09/05/21 -last IV Feraheme on 01/09/22, plan for another dose today.   3. Liver Cirrhosis from untreated hepatitis C -history of alcohol abuse, he has cut down now  -chart review shows initial HCV positive in 05/2017, Hep B positive in 07/2019.   4. End-stage Renal Disease -on hemodialysis M/W/F     PLAN: -proceed with IV Feraheme today -lab monthly -f/u in 3 months -will message IR Dr. Serafina Royals for his f/u. Due to his IV iron and ESRD, he may not be able to get liver MRI    No problem-specific Assessment & Plan notes found for this encounter.   SUMMARY OF ONCOLOGIC HISTORY: Oncology History  Hepatocellular carcinoma ([place])  02/07/2021 Imaging   CLINICAL DATA:  Abdominal distension   EXAM: CT ABDOMEN AND PELVIS WITHOUT CONTRAST  IMPRESSION: Cholelithiasis with possible mild gallbladder wall thickening. Correlate for symptoms of right upper quadrant pain and consider further evaluation with gallbladder ultrasound.   Otherwise, no acute findings in the abdomen or pelvis.   Aortic aneurysm NOS (ICD10-I71.9).   02/07/2021 Imaging   CLINICAL DATA:  Cholelithiasis. Question cholecystitis. Abdominal distension.   EXAM: ULTRASOUND ABDOMEN LIMITED RIGHT UPPER QUADRANT  IMPRESSION: 1. Cholelithiasis with associated nonspecific gallbladder wall thickening. No pericholecystic fluid or sonographic Percell Miller sign reported. Gallbladder wall thickening can be seen in the setting of chronic liver disease. Correlate with clinical exam for acute cholecystitis. 2. Limited ultrasound to penetration of sonographic waves.   03/13/2021 Imaging   CLINICAL DATA:  61 year old male with generalized abdominal pain and bright red blood in stool x3 days.   EXAM: CT ABDOMEN AND PELVIS WITHOUT CONTRAST  IMPRESSION: 1. Chronic cholelithiasis, with  indistinct appearance of the gallbladder wall similar to the CT last month. However, relatively contracted state of the gallbladder argues against  Acute Cholecystitis. But if there is right upper quadrant pain recommend Right Upper Quadrant Ultrasound may be valuable.   2. No other acute or inflammatory process identified in the non-contrast abdomen or pelvis.   3. Diffuse calcified atherosclerosis, Aortic atherosclerosis (ICD10-I70.0). Left nephrolithiasis. Mild large bowel diverticulosis.   07/23/2021 PET scan   IMPRESSION: 1. The ground-glass nodule in the RIGHT lower lobe is not well demonstrated on the nondiagnostic CT portion the PET-CT scan. No hypermetabolic lesion present on the PET data set within the RIGHT lower lobe. 2. Bands of intensely hypermetabolic peripheral atelectasis in the medial RIGHT lower lobe and lateral LEFT lower lobe/lingula. Favor benign benign atelectasis or other inflammatory process. Recommend follow-up CT (6 - 8 weeks) to demonstrate resolution. If pattern persists or increased tissue sampling may be warranted. 3. No metastatic adenopathy or distant metastatic disease. 4. Incidental findings of large central non hypermetabolic lesion in the liver. Recommend MRI of the liver with contrast for further characterization.   08/05/2021 Imaging   CLINICAL DATA:  GI bleed, melena, history of duodenal ulcer, anemia and end-stage renal disease.   EXAM: CT ANGIOGRAPHY ABDOMEN AND PELVIS WITH CONTRAST AND WITHOUT CONTRAST  IMPRESSION: 1. No active bleeding is identified into the gastrointestinal tract during arterial and venous phases of CT imaging. 2. Irregular outpouching along the medial aspect of the proximal duodenum near the expected position of the duodenal bulb or post bulbar duodenum. This is highly suspicious for a focal duodenal ulcer. There is some adjacent edema in the retroperitoneal fat. This may be the source of GI bleeding clinically. 3. Detection of a rounded 4.4 cm central hepatic mass near the juncture of segments IVb and VIII as well as a possible adjacent 2.2 cm mass in segment  VIII. Main differential considerations are some type of well differentiated carcinoma such as hepatocellular carcinoma or neuroendocrine carcinoma. Metastatic disease or cholangiocarcinoma is not excluded. Further evaluation with MRI of the abdomen with and without contrast is warranted.   08/06/2021 Initial Diagnosis   Hepatocellular carcinoma ([place])   08/07/2021 Imaging   CLINICAL DATA:  Liver mass, history of hepatitis-C   EXAM: MRI ABDOMEN WITHOUT AND WITH CONTRAST (INCLUDING MRCP)  IMPRESSION: 1. Hepatic cirrhosis. 5 cm mass centered in segment 4A is LI-RADS 5, consistent with hepatocellular carcinoma. 2. Adjacent 2 cm mass in the central right hepatic lobe as described, LI-RADS 3. Consider follow-up in 3-6 months. 3. Mild splenomegaly likely secondary to portal hypertension. 4. Trace ascites. 5. Small bilateral pleural effusions. Abdominal wall subcutaneous edema noted.   08/07/2021 Cancer Staging   Staging form: Liver, AJCC 8th Edition - Clinical stage from 08/07/2021: Stage II (cT2, cN0, cM0) - Signed by Truitt Merle, MD on 08/30/2021 Stage prefix: Initial diagnosis      INTERVAL HISTORY:  Daniel [name] is here for a follow up of [place]. He was last seen by me on 12/12/21. He presents to the clinic alone. I asked about his recent hospital visits. He reports his previous diarrhea has resolved. He reports some issues sleeping.   All other systems were reviewed with the patient and are negative.  MEDICAL HISTORY:  Past Medical History:  Diagnosis Date   Anemia    Diabetes mellitus without complication [facility])    patient denies   Dialysis patient [facility])    End stage chronic kidney disease ([place])  Hypertension    ICH (intracerebral hemorrhage) ([place]) 05/20/2017   PAD (peripheral artery disease) ([facility])    Shoulder pain, left 06/28/2013    SURGICAL HISTORY: Past Surgical History:  Procedure Laterality Date   A/V FISTULAGRAM N/A 08/15/2020   Procedure: A/V FISTULAGRAM - Left  Upper;  Surgeon: Cherre Robins, MD;  Location: [place] [facility];  Service: Cardiovascular;  Laterality: N/A;   ABDOMINAL AORTOGRAM W/LOWER EXTREMITY N/A 04/08/2021   Procedure: ABDOMINAL AORTOGRAM W/LOWER EXTREMITY;  Surgeon: Waynetta Sandy, MD;  Location: [place] [facility];  Service: Cardiovascular;  Laterality: N/A;   AMPUTATION Left 04/16/2021   Procedure: LEFT BELOW KNEE AMPUTATION;  Surgeon: Angelia Mould, MD;  Location: [place];  Service: Vascular;  Laterality: Left;   AMPUTATION Left 06/17/2021   Procedure: REVISION AMPUTATION BELOW KNEE;  Surgeon: Broadus John, MD;  Location: [place];  Service: Vascular;  Laterality: Left;   APPENDECTOMY     APPLICATION OF WOUND VAC Left 06/17/2021   Procedure: APPLICATION OF WOUND VAC;  Surgeon: Broadus John, MD;  Location: [place];  Service: Vascular;  Laterality: Left;   AV FISTULA PLACEMENT Left 08/03/2019   Procedure: LEFT ARM ARTERIOVENOUS (AV) CEPHALIC  FISTULA CREATION;  Surgeon: Waynetta Sandy, MD;  Location: [place];  Service: Vascular;  Laterality: Left;   BIOPSY  06/30/2019   Procedure: BIOPSY;  Surgeon: Ronald Lobo, MD;  Location: [place] [facility];  Service: Endoscopy;;   BIOPSY  08/02/2019   Procedure: BIOPSY;  Surgeon: Yetta Flock, MD;  Location: [facility] ENDOSCOPY;  Service: Gastroenterology;;   BIOPSY  02/08/2021   Procedure: BIOPSY;  Surgeon: Sharyn Creamer, MD;  Location: [facility] ENDOSCOPY;  Service: Gastroenterology;;   BIOPSY  02/24/2021   Procedure: BIOPSY;  Surgeon: [name], MD;  Location: [facility] ENDOSCOPY;  Service: Gastroenterology;;   BIOPSY  03/26/2021   Procedure: BIOPSY;  Surgeon: Lavena Bullion, DO;  Location: [place];  Service: Gastroenterology;;   BIOPSY  08/06/2021   Procedure: BIOPSY;  Surgeon: [name], MD;  [place]  Service: Gastroenterology;;   BIOPSY  09/15/2021   Procedure: BIOPSY;  Surgeon: Sharyn Creamer, MD;  Location: [place];   Service: Gastroenterology;;   BRONCHIAL BIOPSY  12/26/2021   Procedure: BRONCHIAL BIOPSIES;  Surgeon: Juanito Doom, MD;  Location: [place];  Service: Cardiopulmonary;;   BRONCHIAL WASHINGS  12/26/2021   Procedure: BRONCHIAL WASHINGS;  Surgeon: Juanito Doom, MD;  Location: [place];  Service: Cardiopulmonary;;   COLONOSCOPY  01/23/2012   Procedure: COLONOSCOPY;  Surgeon: Danie Binder, MD;  [facility];  Service: Endoscopy;  Laterality: N/A;  11:10 AM   COLONOSCOPY WITH PROPOFOL N/A 06/30/2019   Procedure: COLONOSCOPY WITH PROPOFOL;  Surgeon: Ronald Lobo, MD;  Location: [place];  Service: Endoscopy;  Laterality: N/A;   ENTEROSCOPY N/A 08/02/2019   Procedure: ENTEROSCOPY;  Surgeon: Yetta Flock, MD;  Location: [facility] ENDOSCOPY;  Service: Gastroenterology;  Laterality: N/A;   ENTEROSCOPY N/A 02/24/2021   Procedure: ENTEROSCOPY;  Surgeon: [name], MD;  Location: [facility] ENDOSCOPY;  Service: Gastroenterology;  Laterality: N/A;   ENTEROSCOPY N/A 03/26/2021   Procedure: ENTEROSCOPY;  Surgeon: Lavena Bullion, DO;  Location: [place];  Service: Gastroenterology;  Laterality: N/A;   ENTEROSCOPY N/A 07/05/2021   Procedure: ENTEROSCOPY;  Surgeon: Carol Ada, MD;  Location: [place];  Service: Gastroenterology;  Laterality: N/A;   ENTEROSCOPY N/A 08/06/2021   Procedure: ENTEROSCOPY;  Surgeon: [name], MD;  [facility];  Service: Gastroenterology;  Laterality: N/A;   ENTEROSCOPY N/A 08/24/2021   Procedure: ENTEROSCOPY;  Surgeon: Ladene Artist, MD;  Location: [facility] ENDOSCOPY;  Service: Gastroenterology;  Laterality: N/A;   ENTEROSCOPY N/A 09/15/2021   Procedure: ENTEROSCOPY;  Surgeon: Sharyn Creamer, MD;  Location: [facility] ENDOSCOPY;  Service: Gastroenterology;  Laterality: N/A;   ENTEROSCOPY N/A 10/06/2021   Procedure: ENTEROSCOPY;  Surgeon: [name], MD;  Location: [facility] ENDOSCOPY;  Service: Gastroenterology;  Laterality:  N/A;   ENTEROSCOPY N/A 11/14/2021   Procedure: ENTEROSCOPY;  Surgeon: Doran Stabler, MD;  Location: [facility] ENDOSCOPY;  Service: Gastroenterology;  Laterality: N/A;   ESOPHAGOGASTRODUODENOSCOPY N/A 08/10/2020   Procedure: ESOPHAGOGASTRODUODENOSCOPY (EGD);  Surgeon: Jerene Bears, MD;  Location: [facility] ENDOSCOPY;  Service: Gastroenterology;  Laterality: N/A;   ESOPHAGOGASTRODUODENOSCOPY (EGD) WITH PROPOFOL N/A 06/30/2019   Procedure: ESOPHAGOGASTRODUODENOSCOPY (EGD) WITH PROPOFOL;  Surgeon: Ronald Lobo, MD;  Location: [place];  Service: Endoscopy;  Laterality: N/A;   ESOPHAGOGASTRODUODENOSCOPY (EGD) WITH PROPOFOL N/A 01/12/2021   Procedure: ESOPHAGOGASTRODUODENOSCOPY (EGD) WITH PROPOFOL;  Surgeon: Sharyn Creamer, MD;  Location: [place];  Service: Gastroenterology;  Laterality: N/A;   ESOPHAGOGASTRODUODENOSCOPY (EGD) WITH PROPOFOL N/A 02/08/2021   Procedure: ESOPHAGOGASTRODUODENOSCOPY (EGD) WITH PROPOFOL;  Surgeon: Sharyn Creamer, MD;  Location: [place];  Service: Gastroenterology;  Laterality: N/A;   FISTULA SUPERFICIALIZATION Left 10/17/2019   Procedure: LEFT UPPER EXTREMITY FISTULA REVISION, SIDE BRANCH LIGATION,  AND SUPERFICIALIZATION;  Surgeon: Marty Heck, MD;  Location: [place];  Service: Vascular;  Laterality: Left;   FISTULA SUPERFICIALIZATION Left 02/19/8526   Procedure: PLICATION OF ANEURYSM LEFT ARTERIOVENOUS FISTULA;  Surgeon: Angelia Mould, MD;  Location: [place];  Service: Vascular;  Laterality: Left;   FLEXIBLE SIGMOIDOSCOPY N/A 07/05/2021   Procedure: FLEXIBLE SIGMOIDOSCOPY;  Surgeon: Carol Ada, MD;  Location: [place];  Service: Gastroenterology;  Laterality: N/A;   GIVENS CAPSULE STUDY N/A 06/30/2019   Procedure: GIVENS CAPSULE STUDY;  Surgeon: Ronald Lobo, MD;  Location: [place];  Service: Endoscopy;  Laterality: N/A;   GIVENS CAPSULE STUDY N/A 08/25/2021   Procedure: GIVENS CAPSULE STUDY;  Surgeon: Ladene Artist, MD;  Location: [place];  Service: Gastroenterology;  Laterality: N/A;   GIVENS CAPSULE STUDY N/A 10/06/2021   Procedure: GIVENS CAPSULE STUDY;  Surgeon: [name], MD;  Location: [place];  Service: Gastroenterology;  Laterality: N/A;   HEMOSTASIS CLIP PLACEMENT  08/10/2020   Procedure: HEMOSTASIS CLIP PLACEMENT;  Surgeon: Jerene Bears, MD;  Location: [place];  Service: Gastroenterology;;   HEMOSTASIS CLIP PLACEMENT  01/12/2021   Procedure: HEMOSTASIS CLIP PLACEMENT;  Surgeon: Sharyn Creamer, MD;  Location: [place];  Service: Gastroenterology;;   HEMOSTASIS CONTROL  08/02/2019   Procedure: HEMOSTASIS CONTROL;  Surgeon: Yetta Flock, MD;  Location: [place];  Service: Gastroenterology;;   HOT HEMOSTASIS  02/24/2021   Procedure: HOT HEMOSTASIS (ARGON PLASMA COAGULATION/BICAP);  Surgeon: [name], MD;  Location: [facility] ENDOSCOPY;  Service: Gastroenterology;;   HOT HEMOSTASIS N/A 03/26/2021   Procedure: HOT HEMOSTASIS (ARGON PLASMA COAGULATION/BICAP);  Surgeon: Lavena Bullion, DO;  Location: [facility] ENDOSCOPY;  Service: Gastroenterology;  Laterality: N/A;   HOT HEMOSTASIS N/A 07/05/2021   Procedure: HOT HEMOSTASIS (ARGON PLASMA COAGULATION/BICAP);  Surgeon: Carol Ada, MD;  Location: [place];  Service: Gastroenterology;  Laterality: N/A;   HOT HEMOSTASIS N/A 09/15/2021   Procedure: HOT HEMOSTASIS (ARGON PLASMA COAGULATION/BICAP);  Surgeon: Sharyn Creamer, MD;  Location: [place];  Service: Gastroenterology;  Laterality:  N/A;   HOT HEMOSTASIS N/A 10/06/2021   Procedure: HOT HEMOSTASIS (ARGON PLASMA COAGULATION/BICAP);  Surgeon: [name], MD;  Location: [place];  Service: Gastroenterology;  Laterality: N/A;   INCISION AND DRAINAGE ABSCESS N/A 06/29/2016   Procedure: INCISION AND DRAINAGE ABDOMINAL WALL ABSCESS;  Surgeon: Alphonsa Overall, MD;  Location: [facility];  Service: General;  Laterality: N/A;   INSERTION OF DIALYSIS CATHETER Right 08/03/2019   Procedure:  INSERTION OF DIALYSIS CATHETER;  Surgeon: Waynetta Sandy, MD;  Location: [place];  Service: Vascular;  Laterality: Right;   INSERTION OF DIALYSIS CATHETER Right 10/22/2019   Procedure: INSERTION OF 23CM TUNNELED DIALYSIS CATHETER RIGHT INTERNAL JUGULAR;  Surgeon: Angelia Mould, MD;  Location: [place];  Service: Vascular;  Laterality: Right;   INSERTION OF DIALYSIS CATHETER Right 08/12/2020   Procedure: INSERTION OF Right internal Jugular TUNNELED  DIALYSIS CATHETER.;  Surgeon: Waynetta Sandy, MD;  Location: [place];  Service: Vascular;  Laterality: Right;   INSERTION OF DIALYSIS CATHETER N/A 04/19/2021   Procedure: INSERTION OF TUNNELED DIALYSIS CATHETER;  Surgeon: Angelia Mould, MD;  [facility];  Service: Vascular;  Laterality: N/A;   IR 3D INDEPENDENT WKST  11/07/2021   IR ANGIOGRAM SELECTIVE EACH ADDITIONAL VESSEL  11/07/2021   IR ANGIOGRAM SELECTIVE EACH ADDITIONAL VESSEL  11/07/2021   IR ANGIOGRAM SELECTIVE EACH ADDITIONAL VESSEL  11/07/2021   IR ANGIOGRAM SELECTIVE EACH ADDITIONAL VESSEL  11/28/2021   IR ANGIOGRAM VISCERAL SELECTIVE  11/07/2021   IR ANGIOGRAM VISCERAL SELECTIVE  11/28/2021   IR EMBO ARTERIAL NOT HEMORR HEMANG INC GUIDE ROADMAPPING  11/07/2021   IR EMBO TUMOR ORGAN ISCHEMIA INFARCT INC GUIDE ROADMAPPING  11/28/2021   IR RADIOLOGIST EVAL & MGMT  09/12/2021   IR US GUIDE VASC ACCESS LEFT  11/07/2021   IR US GUIDE VASC ACCESS LEFT  11/28/2021   Left heel surgery     LOWER EXTREMITY ANGIOGRAPHY N/A 07/08/2021   Procedure: Lower Extremity Angiography;  Surgeon: Cherre Robins, MD;  Location: [place] [facility];  Service: Cardiovascular;  Laterality: N/A;   PENILE BIOPSY N/A 03/26/2020   Procedure: PENILE ULCER DEBRIDEMENT;  Surgeon: Remi Haggard, MD;  [facility];  Service: Urology;  Laterality: N/A;  30 MINS   PERIPHERAL VASCULAR INTERVENTION Right 07/08/2021   Procedure: PERIPHERAL VASCULAR INTERVENTION;  Surgeon: Cherre Robins, MD;   Location: [place] [facility];  Service: Cardiovascular;  Laterality: Right;   SCLEROTHERAPY  01/12/2021   Procedure: SCLEROTHERAPY;  Surgeon: Sharyn Creamer, MD;  Location: [facility] ENDOSCOPY;  Service: Gastroenterology;;   Clide Deutscher  02/24/2021   Procedure: Clide Deutscher;  Surgeon: [name], MD;  Location: [place];  Service: Gastroenterology;;   SUBMUCOSAL TATTOO INJECTION  08/24/2021   Procedure: SUBMUCOSAL TATTOO INJECTION;  Surgeon: Ladene Artist, MD;  Location: [place];  Service: Gastroenterology;;   SUBMUCOSAL TATTOO INJECTION  09/15/2021   Procedure: SUBMUCOSAL TATTOO INJECTION;  Surgeon: Sharyn Creamer, MD;  Location: [place];  Service: Gastroenterology;;   VIDEO BRONCHOSCOPY N/A 12/26/2021   Procedure: VIDEO BRONCHOSCOPY WITH FLUORO;  Surgeon: Juanito Doom, MD;  Location: [place];  Service: Cardiopulmonary;  Laterality: N/A;    I have reviewed the social history and family history with the patient and they are unchanged from previous note.  ALLERGIES:  is allergic to seroquel [quetiapine] and dilaudid [hydromorphone hcl].  MEDICATIONS:  Current Outpatient Medications  Medication Sig Dispense Refill   amLODipine (NORVASC) 5 MG tablet Take 1 tablet (5  mg total) by mouth daily. 30 tablet 2   atorvastatin (LIPITOR) 40 MG tablet Take 1 tablet (40 mg total) by mouth daily. 30 tablet 2   folic acid (FOLVITE) 1 MG tablet Take 1 tablet (1 mg total) by mouth daily.     loperamide (IMODIUM) 2 MG capsule Take 1 capsule (2 mg total) by mouth 4 (four) times daily as needed for diarrhea or loose stools. 12 capsule 0   loperamide (IMODIUM) 2 MG capsule Take 1 capsule (2 mg total) by mouth 4 (four) times daily as needed for diarrhea or loose stools. 24 capsule 0   Multiple Vitamin (MULTIVITAMIN WITH MINERALS) TABS tablet Take 1 tablet by mouth daily.     pantoprazole (PROTONIX) 40 MG tablet Take 1 tablet (40 mg total) by mouth 2 (two) times daily before a meal. 60  tablet 2   thiamine (VITAMIN B1) 100 MG tablet Take 1 tablet (100 mg total) by mouth daily.     traZODone (DESYREL) 50 MG tablet Take 1 tablet (50 mg total) by mouth at bedtime as needed for sleep. (Patient taking differently: Take 50 mg by mouth daily as needed for sleep.) 90 tablet 1   No current facility-administered medications for this visit.    PHYSICAL EXAMINATION: ECOG PERFORMANCE STATUS: 2 - Symptomatic, <50% confined to bed  Vitals:   02/06/22 1010  BP: 131/80  Pulse: 86  Resp: 15  Temp: 97.8 F (36.6 C)  SpO2: 98%   Wt Readings from Last 3 Encounters:  01/31/22 240 lb (108.9 kg)  01/30/22 240 lb (108.9 kg)  01/21/22 216 lb (98 kg)     GENERAL:alert, no distress and comfortable SKIN: skin color normal, no rashes or significant lesions EYES: normal, Conjunctiva are pink and non-injected, sclera clear  NEURO: alert & oriented x 3 with fluent speech  LABORATORY DATA:  I have reviewed the data as listed    Latest Ref Rng & Units 02/06/2022    9:43 AM 01/31/2022   10:32 AM 01/30/2022    2:11 AM  CBC  WBC 4.0 - 10.5 K/uL 3.8  4.3  4.3   Hemoglobin 13.0 - 17.0 g/dL 8.4  9.1  8.9   Hematocrit 39.0 - 52.0 % 25.3  28.4  26.5   Platelets 150 - 400 K/uL 95  95  110         Latest Ref Rng & Units 02/06/2022    9:43 AM 01/31/2022   10:32 AM 01/30/2022    2:11 AM  CMP  Glucose 70 - 99 mg/dL 99  96  118   BUN 8 - 23 mg/dL 25  37  28   Creatinine 0.61 - 1.24 mg/dL 6.60  8.93  7.60   Sodium 135 - 145 mmol/L 132  135  134   Potassium 3.5 - 5.1 mmol/L 4.2  4.4  4.1   Chloride 98 - 111 mmol/L 95  99  96   CO2 22 - 32 mmol/L '29  23  27   '$ Calcium 8.9 - 10.3 mg/dL 8.5  8.5  8.3   Total Protein 6.5 - 8.1 g/dL 7.9   8.2   Total Bilirubin 0.3 - 1.2 mg/dL 1.0   0.9   Alkaline Phos 38 - 126 U/L 125   108   AST 15 - 41 U/L 70   104   ALT 0 - 44 U/L 24   40       RADIOGRAPHIC STUDIES: I have personally reviewed the radiological images as  listed and agreed with the findings  in the report. No results found.    No orders of the defined types were placed in this encounter.  All questions were answered. The patient knows to call the clinic with any problems, questions or concerns. No barriers to learning was detected. The total time spent in the appointment was 30 minutes.     Truitt Merle, MD 02/06/2022   I, Wilburn Mylar, am acting as scribe for Truitt Merle, MD.   I have reviewed the above documentation for accuracy and completeness, and I agree with the above.

## 2022-02-06 NOTE — Telephone Encounter (Signed)
Medication Refill - Medication: amLODipine (NORVASC) 5 MG tablet, atorvastatin (LIPITOR) 40 MG tablet, traZODone (DESYREL) 50 MG tablet, and hydrOXYzine (ATARAX) 25 MG tablet,   Has the patient contacted their pharmacy? Yes.    Preferred Pharmacy (with phone number or street name):  [facility], [address] Phone:  [phone]  Fax:  [phone]     Has the patient been seen for an appointment in the last year OR does the patient have an upcoming appointment? Yes.    Agent: Please be advised that RX refills may take up to 3 business days. We ask that you follow-up with your pharmacy.

## 2022-02-06 NOTE — Telephone Encounter (Unsigned)
Copied from [place] [id number]. Topic: Quick Communication - Home Health Verbal Orders >> Feb 06, 2022  1:30 PM Ja-Kwan M wrote: Caller/Agency: Farrel Demark with [facility]  Callback Number: [phone] Requesting OT/PT/Skilled Nursing/Social Work/Speech Therapy: social worker  Frequency: 1 x 1 week for evaluation

## 2022-02-07 ENCOUNTER — Other Ambulatory Visit (INDEPENDENT_AMBULATORY_CARE_PROVIDER_SITE_OTHER): Payer: Self-pay | Admitting: Primary Care

## 2022-02-07 ENCOUNTER — Telehealth: Payer: Self-pay | Admitting: Hematology

## 2022-02-07 NOTE — Telephone Encounter (Signed)
Called patient to review medication requests and schedule appt. No answer, LVMTCB [phone]

## 2022-02-07 NOTE — Telephone Encounter (Signed)
Requested medication (s) are due for refill today:   Provider to review.   These are prescribed by another provider  Requested medication (s) are on the active medication list:   Yes except the hydroxyzine 25 mg  Future visit scheduled:   No   Last ordered: amlodipine 09/18/2021 #30, 2 refills;   atorvastatin 09/18/2021 #30, 2 refills;   trazodone 11/15/2021 #90, 1 refill;   hydroxyzine 25 mg is not on med. List  Returned because all of these prescribed by other providers.    Requested Prescriptions  Pending Prescriptions Disp Refills   amLODipine (NORVASC) 5 MG tablet 30 tablet 2    Sig: Take 1 tablet (5 mg total) by mouth daily.     Cardiovascular: Calcium Channel Blockers 2 Failed - 02/06/2022  2:00 PM      Failed - Last BP in normal range    BP Readings from Last 1 Encounters:  02/06/22 (!) 156/95         Passed - Last Heart Rate in normal range    Pulse Readings from Last 1 Encounters:  02/06/22 88         Passed - Valid encounter within last 6 months    Recent Outpatient Visits           1 month ago Chronic pain syndrome   [place] [name] P, NP   1 month ago Hx of BKA, left ([place])   [place] [facility] [name] P, NP   3 months ago Insomnia, unspecified type   [place] [name] P, NP   5 months ago Controlled type 2 diabetes mellitus with complication, without long-term current use of insulin ([place])   [place], Michelle P, NP   11 months ago Iron deficiency anemia due to chronic blood loss   [facility] [name], NP       Future Appointments             In 1 month Gwenlyn Found, Pearletha Forge, MD [place] [facility] [place]             atorvastatin (LIPITOR) 40 MG tablet 30 tablet 2    Sig: Take 1 tablet (40 mg total) by mouth daily.     Cardiovascular:  Antilipid -  Statins Failed - 02/06/2022  2:00 PM      Failed - Lipid Panel in normal range within the last 12 months    Cholesterol, Total  Date Value Ref Range Status  12/28/2019 103 100 - 199 mg/dL Final   LDL Chol Calc (NIH)  Date Value Ref Range Status  12/28/2019 32 0 - 99 mg/dL Final   HDL  Date Value Ref Range Status  12/28/2019 35 (L) >39 mg/dL Final   Triglycerides  Date Value Ref Range Status  12/28/2019 235 (H) 0 - 149 mg/dL Final         Passed - Patient is not pregnant      Passed - Valid encounter within last 12 months    Recent Outpatient Visits           1 month ago Chronic pain syndrome   [place] [name] P, NP   1 month ago Hx of BKA, left ([place])   [facility] [name] P, NP   3 months ago Insomnia, unspecified  type   [facility] [name] P, NP   5 months ago Controlled type 2 diabetes mellitus with complication, without long-term current use of insulin ([place])   [place], [place] P, NP   11 months ago Iron deficiency anemia due to chronic blood loss   [facility] [name], NP       Future Appointments             In 1 month Gwenlyn Found, Pearletha Forge, MD [place] [facility] [place]             traZODone (DESYREL) 50 MG tablet 90 tablet 1    Sig: Take 1 tablet (50 mg total) by mouth at bedtime as needed for sleep.     Psychiatry: Antidepressants - Serotonin Modulator Passed - 02/06/2022  2:00 PM      Passed - Valid encounter within last 6 months    Recent Outpatient Visits           1 month ago Chronic pain syndrome   [place] [name] P, NP   1 month ago Hx of BKA, left ([place])   [place] [facility] [name] P, NP   3 months ago Insomnia, unspecified type   [place]  [name] P, NP   5 months ago Controlled type 2 diabetes mellitus with complication, without long-term current use of insulin ([place])   [place], Michelle P, NP   11 months ago Iron deficiency anemia due to chronic blood loss   [facility] [name], NP       Future Appointments             In 1 month Gwenlyn Found, Pearletha Forge, MD [place]. [place]

## 2022-02-07 NOTE — Telephone Encounter (Signed)
Spoke with patient about upcoming appointments. °

## 2022-02-07 NOTE — Telephone Encounter (Signed)
Copied from [place] [id number]. Topic: General - Other >> Feb 07, 2022  3:27 PM Everette C wrote: Reason for CRM: Medication Refill - Medication: amLODipine (NORVASC) 5 MG tablet [[id number]]   atorvastatin (LIPITOR) 40 MG tablet [[id number]]   Has the patient contacted their pharmacy? Yes.  The patient has been directed to contact their PCP  (Agent: If no, request that the patient contact the pharmacy for the refill. If patient does not wish to contact the pharmacy document the reason why and proceed with request.) (Agent: If yes, when and what did the pharmacy advise?)  Preferred Pharmacy (with phone number or street name): [place], [place] [address] Phone: [phone] Fax: [phone] Hours: Not open 24 hours   Has the patient been seen for an appointment in the last year OR does the patient have an upcoming appointment? Yes.    Agent: Please be advised that RX refills may take up to 3 business days. We ask that you follow-up with your pharmacy.

## 2022-02-07 NOTE — Telephone Encounter (Signed)
Returned Princess call and lvm giving her verbal orders and if she has any questions or concerns to give [place] a call

## 2022-02-07 NOTE — Telephone Encounter (Signed)
FYI

## 2022-02-07 NOTE — Telephone Encounter (Signed)
Called patient to schedule appt. For medication refills. No answer, LVMTCB [phone].

## 2022-02-07 NOTE — Telephone Encounter (Signed)
Pt returned call, declined offer to schedule an appt, and states he would rather get sick

## 2022-02-07 NOTE — Telephone Encounter (Signed)
Requested medication (s) are due for refill today: yes   Requested medication (s) are on the active medication list: yes , hydroxyzine 25 mg not on med list   Last refill:  norvasc , lipitor- 09/18/21-12/26/22 #30 2 refills, trazadone- 11/15/21 #90 1 refill  Future visit scheduled: no   Notes to clinic:  medications last ordered by E. Reather Littler, DO for norvasc , lipitor and Matthew Folks, MD for trazadone. Hyroxyzine 25 mg not on med list. Do you want to refill or order Rxs? Called patient to review medication requests and schedule appt. No answer, LVMTCB please advise      Requested Prescriptions  Pending Prescriptions Disp Refills   amLODipine (NORVASC) 5 MG tablet 30 tablet 2    Sig: Take 1 tablet (5 mg total) by mouth daily.     Cardiovascular: Calcium Channel Blockers 2 Failed - 02/07/2022  1:25 PM      Failed - Last BP in normal range    BP Readings from Last 1 Encounters:  02/06/22 (!) 156/95         Passed - Last Heart Rate in normal range    Pulse Readings from Last 1 Encounters:  02/06/22 88         Passed - Valid encounter within last 6 months    Recent Outpatient Visits           1 month ago Chronic pain syndrome   [place] [name] P, NP   1 month ago Hx of BKA, left ([place])   [place] [facility] [name] P, NP   3 months ago Insomnia, unspecified type   [place] [name] P, NP   5 months ago Controlled type 2 diabetes mellitus with complication, without long-term current use of insulin ([place])   [place], Michelle P, NP   11 months ago Iron deficiency anemia due to chronic blood loss   [facility] [name], NP       Future Appointments             In 1 month Gwenlyn Found, Pearletha Forge, MD [place] [facility] [place]             atorvastatin (LIPITOR) 40 MG  tablet 30 tablet 2    Sig: Take 1 tablet (40 mg total) by mouth daily.     Cardiovascular:  Antilipid - Statins Failed - 02/07/2022  1:25 PM      Failed - Lipid Panel in normal range within the last 12 months    Cholesterol, Total  Date Value Ref Range Status  12/28/2019 103 100 - 199 mg/dL Final   LDL Chol Calc (NIH)  Date Value Ref Range Status  12/28/2019 32 0 - 99 mg/dL Final   HDL  Date Value Ref Range Status  12/28/2019 35 (L) >39 mg/dL Final   Triglycerides  Date Value Ref Range Status  12/28/2019 235 (H) 0 - 149 mg/dL Final         Passed - Patient is not pregnant      Passed - Valid encounter within last 12 months    Recent Outpatient Visits           1 month ago Chronic pain syndrome   [place] [name] P, NP   1 month ago Hx of BKA, left ([place])  [facility] [name] P, NP   3 months ago Insomnia, unspecified type   [place] [name] P, NP   5 months ago Controlled type 2 diabetes mellitus with complication, without long-term current use of insulin ([place])   [place], [place] P, NP   11 months ago Iron deficiency anemia due to chronic blood loss   [facility] [name], NP       Future Appointments             In 1 month Gwenlyn Found, Pearletha Forge, MD [place] [facility] [place]             traZODone (DESYREL) 50 MG tablet 90 tablet 1    Sig: Take 1 tablet (50 mg total) by mouth at bedtime as needed for sleep.     Psychiatry: Antidepressants - Serotonin Modulator Passed - 02/07/2022  1:25 PM      Passed - Valid encounter within last 6 months    Recent Outpatient Visits           1 month ago Chronic pain syndrome   [place] [name] P, NP   1 month ago Hx of BKA, left ([place])   [place] [facility]  [name] P, NP   3 months ago Insomnia, unspecified type   [place] [name] P, NP   5 months ago Controlled type 2 diabetes mellitus with complication, without long-term current use of insulin ([place])   [place], Michelle P, NP   11 months ago Iron deficiency anemia due to chronic blood loss   [facility] [name], NP       Future Appointments             In 1 month Gwenlyn Found, Pearletha Forge, MD [place] [facility] [place]. [place]

## 2022-02-07 NOTE — Telephone Encounter (Signed)
Pt called to report that he is completely out of his current supply, he has stopped by the pharmacy. Advised that medication is still pending.

## 2022-02-07 NOTE — Telephone Encounter (Signed)
Will forward to provider  

## 2022-02-08 ENCOUNTER — Other Ambulatory Visit: Payer: Self-pay

## 2022-02-08 LAB — ACID FAST CULTURE WITH REFLEXED SENSITIVITIES (MYCOBACTERIA): Acid Fast Culture: NEGATIVE

## 2022-02-10 MED ORDER — AMLODIPINE BESYLATE 5 MG PO TABS: 5.0000 mg | ORAL_TABLET | Freq: Every day | ORAL | 2 refills | Status: DC

## 2022-02-10 MED ORDER — TRAZODONE HCL 50 MG PO TABS: 50.0000 mg | ORAL_TABLET | Freq: Every day | ORAL | 0 refills | Status: AC | PRN

## 2022-02-10 NOTE — Telephone Encounter (Signed)
Requested medications are due for refill today.  yes  Requested medications are on the active medications list.  yes  Last refill. 09/18/2021 #30 2 rf  Future visit scheduled.   No  Notes to clinic.  Labs are expired. Rx was signed by Randall Hiss [place]    Requested Prescriptions  Pending Prescriptions Disp Refills   atorvastatin (LIPITOR) 40 MG tablet 30 tablet 2    Sig: Take 1 tablet (40 mg total) by mouth daily.     Cardiovascular:  Antilipid - Statins Failed - 02/07/2022  4:21 PM      Failed - Lipid Panel in normal range within the last 12 months    Cholesterol, Total  Date Value Ref Range Status  12/28/2019 103 100 - 199 mg/dL Final   LDL Chol Calc (NIH)  Date Value Ref Range Status  12/28/2019 32 0 - 99 mg/dL Final   HDL  Date Value Ref Range Status  12/28/2019 35 (L) >39 mg/dL Final   Triglycerides  Date Value Ref Range Status  12/28/2019 235 (H) 0 - 149 mg/dL Final         Passed - Patient is not pregnant      Passed - Valid encounter within last 12 months    Recent Outpatient Visits           1 month ago Chronic pain syndrome   [place] [name] P, NP   1 month ago Hx of BKA, left ([place])   [place], Michelle P, NP   3 months ago Insomnia, unspecified type   [place] [name] P, NP   5 months ago Controlled type 2 diabetes mellitus with complication, without long-term current use of insulin ([place])   [place], Michelle P, NP   11 months ago Iron deficiency anemia due to chronic blood loss   [facility] [name], NP       Future Appointments             In 1 month Gwenlyn Found, Pearletha Forge, MD [place] [facility] [place] [place]

## 2022-02-11 ENCOUNTER — Telehealth (INDEPENDENT_AMBULATORY_CARE_PROVIDER_SITE_OTHER): Payer: Self-pay | Admitting: Primary Care

## 2022-02-11 ENCOUNTER — Emergency Department (HOSPITAL_COMMUNITY): Payer: Medicare Other

## 2022-02-11 ENCOUNTER — Emergency Department (HOSPITAL_COMMUNITY)
Admission: EM | Admit: 2022-02-11 | Discharge: 2022-02-12 | Disposition: A | Discharge status: Home / Self Care | Payer: Medicare Other | Attending: Emergency Medicine | Admitting: Emergency Medicine

## 2022-02-11 ENCOUNTER — Other Ambulatory Visit: Payer: Self-pay

## 2022-02-11 ENCOUNTER — Ambulatory Visit (INDEPENDENT_AMBULATORY_CARE_PROVIDER_SITE_OTHER): Payer: Self-pay | Admitting: *Deleted

## 2022-02-11 ENCOUNTER — Encounter (HOSPITAL_COMMUNITY): Payer: Self-pay | Admitting: Emergency Medicine

## 2022-02-11 DIAGNOSIS — L299 Pruritus, unspecified: Secondary | ICD-10-CM | POA: Diagnosis present

## 2022-02-11 DIAGNOSIS — R2241 Localized swelling, mass and lump, right lower limb: Secondary | ICD-10-CM | POA: Insufficient documentation

## 2022-02-11 DIAGNOSIS — Z79899 Other long term (current) drug therapy: Secondary | ICD-10-CM | POA: Insufficient documentation

## 2022-02-11 DIAGNOSIS — R7989 Other specified abnormal findings of blood chemistry: Secondary | ICD-10-CM | POA: Insufficient documentation

## 2022-02-11 DIAGNOSIS — M79671 Pain in right foot: Secondary | ICD-10-CM | POA: Insufficient documentation

## 2022-02-11 NOTE — Telephone Encounter (Signed)
Medication discontinued once discharged from the hospital on 08/16/21, now [place] with [facility] is calling to request the refill on behalf of the patient. Hydroxyzine 25 mg.

## 2022-02-11 NOTE — Telephone Encounter (Signed)
Marissa with [facility] is calling in to request Verbal orders for Social Work   Frequency: In 2 weeks  CB: [phone] - secure vm

## 2022-02-11 NOTE — ED Triage Notes (Signed)
Patient reports generalized skin itching this evening and right foot pain with swelling , denies injury .

## 2022-02-11 NOTE — ED Provider Triage Note (Signed)
Emergency Medicine Provider Triage Evaluation Note  Daniel [name] , a 61 y.o. male  was evaluated in triage.  Pt complains of itching all over, and right foot swelling.  Denies any known food exposures, soaps or any other causes.  He states that his whole body itches.  No noticeable rash.  Denies any throat swelling, difficulty breathing, dysphonia, drooling.  He also complains of swelling to his right foot.  No injuries or falls.  He has a left leg amputation.  Denies chest pain or shortness of breath  Review of Systems  Positive: As above Negative: As above  Physical Exam  BP (!) 147/88 (BP Location: Right Arm)   Pulse 90   Temp 98 F (36.7 C) (Oral)   Resp 18   SpO2 92%  Gen:   Awake, no distress   Resp:  Normal effort  MSK:   Moves extremities without difficulty  Other:  No visible rash, no tongue swelling, no drooling, no tripoding, trace edema to right leg, no warmth  Medical Decision Making  Medically screening exam initiated at 11:09 PM.  Appropriate orders placed.  Daniel [name] was informed that the remainder of the evaluation will be completed by another provider, this initial triage assessment does not replace that evaluation, and the importance of remaining in the ED until their evaluation is complete.     Garald Balding, PA-C 02/11/22 2312

## 2022-02-11 NOTE — Telephone Encounter (Signed)
Summary: elevated bp   Marissa with [facility] called in to request a call back to the pt directly.  Pt's bp is 178/92. Pt isn't reporting any other symptoms. Pt says that he feels fine. No dizziness, no light headiness. Marissa with like for pt to be advised further.      Chief Complaint: htn Symptoms: none, HH RN took bp Frequency: out of meds for months Pertinent Negatives: Patient denies symptoms Disposition: '[]'$ ED /'[]'$ Urgent Care (no appt availability in office) / '[]'$ Appointment(In office/virtual)/ '[]'$  [place] [facility]/ '[]'$ Home Care/ '[]'$ Refused Recommended Disposition /'[]'$ [place] [facility]/ '[x]'$  Follow-up with PCP Additional Notes: Had appt in Sept, med called in yesterday pt to get today. Pt wants Trazodone to be increased, he is having to take more than one.  Reason for Disposition  Ran out of BP medications  Answer Assessment - Initial Assessment Questions 1. BLOOD PRESSURE: "What is the blood pressure?" "Did you take at least two measurements 5 minutes apart?"     178/92 2. ONSET: "When did you take your blood pressure?"     This morning the HHRN took it 3. HOW: "How did you take your blood pressure?" (e.g., automatic home BP monitor, visiting nurse)     cuff 4. HISTORY: "Do you have a history of high blood pressure?"     Had been out of Norvasc but getting it today 5. MEDICINES: "Are you taking any medicines for blood pressure?" "Have you missed any doses recently?"     Norvasc 6. OTHER SYMPTOMS: "Do you have any symptoms?" (e.g., blurred vision, chest pain, difficulty breathing, headache, weakness)     no 7. PREGNANCY: "Is there any chance you are pregnant?" "When was your last menstrual period?"     no  Protocols used: Blood Pressure - High-A-AH

## 2022-02-11 NOTE — Telephone Encounter (Signed)
Daniel [name] with [facility] is calling in to request a refill on pt's behalf.   Hydroxyzine 25 MG  Pharmacy:    [place], [address] Phone:  [phone]  Fax:  [phone]      Pt has currently been out a while.   No future or past visits appt

## 2022-02-12 ENCOUNTER — Telehealth (INDEPENDENT_AMBULATORY_CARE_PROVIDER_SITE_OTHER): Payer: Self-pay | Admitting: Primary Care

## 2022-02-12 ENCOUNTER — Other Ambulatory Visit: Payer: Medicare Other

## 2022-02-12 LAB — CBC
HCT: 24.2 % — ABNORMAL LOW (ref 39.0–52.0)
Hemoglobin: 8 g/dL — ABNORMAL LOW (ref 13.0–17.0)
MCH: 33.2 pg (ref 26.0–34.0)
MCHC: 33.1 g/dL (ref 30.0–36.0)
MCV: 100.4 fL — ABNORMAL HIGH (ref 80.0–100.0)
Platelets: 94 10*3/uL — ABNORMAL LOW (ref 150–400)
RBC: 2.41 MIL/uL — ABNORMAL LOW (ref 4.22–5.81)
RDW: 15.4 % (ref 11.5–15.5)
WBC: 3.8 10*3/uL — ABNORMAL LOW (ref 4.0–10.5)
nRBC: 0 % (ref 0.0–0.2)

## 2022-02-12 LAB — COMPREHENSIVE METABOLIC PANEL
ALT: 18 U/L (ref 0–44)
AST: 51 U/L — ABNORMAL HIGH (ref 15–41)
Albumin: 2 g/dL — ABNORMAL LOW (ref 3.5–5.0)
Alkaline Phosphatase: 118 U/L (ref 38–126)
Anion gap: 12 (ref 5–15)
BUN: 26 mg/dL — ABNORMAL HIGH (ref 8–23)
CO2: 24 mmol/L (ref 22–32)
Calcium: 8 mg/dL — ABNORMAL LOW (ref 8.9–10.3)
Chloride: 90 mmol/L — ABNORMAL LOW (ref 98–111)
Creatinine, Ser: 6.99 mg/dL — ABNORMAL HIGH (ref 0.61–1.24)
GFR, Estimated: 8 mL/min — ABNORMAL LOW (ref >60)
Glucose, Bld: 86 mg/dL (ref 70–99)
Potassium: 3.9 mmol/L (ref 3.5–5.1)
Sodium: 126 mmol/L — ABNORMAL LOW (ref 135–145)
Total Bilirubin: 1.2 mg/dL (ref 0.3–1.2)
Total Protein: 7.8 g/dL (ref 6.5–8.1)

## 2022-02-12 LAB — BRAIN NATRIURETIC PEPTIDE: B Natriuretic Peptide: 663.6 pg/mL — ABNORMAL HIGH (ref 0.0–100.0)

## 2022-02-12 MED ORDER — LORATADINE 10 MG PO TABS: 10.0000 mg | ORAL_TABLET | Freq: Once | ORAL | Status: DC

## 2022-02-12 NOTE — Telephone Encounter (Signed)
Pt called and stated the pharmacy [facility] on [address] wouldn't refill the medication he needs for his itching / they stated he couldn't fill it until December and then he stated the provider needed to resend the script / please advise / not sure of name of medication

## 2022-02-12 NOTE — ED Provider Notes (Signed)
[name] EMERGENCY DEPARTMENT Provider Note   CSN: [id number] Arrival date & time: 02/11/22  2248     History  Chief Complaint  Patient presents with   Skin Itching / Foot Pain     Daniel [name] is a 61 y.o. male.  61 year old male with prior medical history as detailed below presents for evaluation of itching.   Itching is "all over" and present for 2-3 days.  He denies any known food exposures, soaps or any other causes. He denies rash.  He denies any throat swelling, difficulty breathing, dysphonia, or drooling.  He also complains of swelling to his right foot.  No injuries or falls.  He is status post a left leg amputation.  He denies chest pain or shortness of breath  The history is provided by the patient and medical records.       Home Medications Prior to Admission medications   Medication Sig Start Date End Date Taking? Authorizing Provider  gabapentin (NEURONTIN) 300 MG capsule Take 300 mg by mouth 3 (three) times daily. 02/07/22  Yes Provider, Historical, [name]  amLODipine (NORVASC) 5 MG tablet Take 1 tablet (5 mg total) by mouth daily. 02/10/22 05/11/22  Daniel [name]  atorvastatin (LIPITOR) 40 MG tablet Take 1 tablet (40 mg total) by mouth daily. 09/18/21 12/26/22  [place] Daniel [name]  folic acid (FOLVITE) 1 MG tablet Take 1 tablet (1 mg total) by mouth daily. 12/12/21   Daniel [name]  loperamide (IMODIUM) 2 MG capsule Take 1 capsule (2 mg total) by mouth 4 (four) times daily as needed for diarrhea or loose stools. 01/31/22   Daniel [name]  Multiple Vitamin (MULTIVITAMIN WITH MINERALS) TABS tablet Take 1 tablet by mouth daily. 12/12/21   Daniel [name]  pantoprazole (PROTONIX) 40 MG tablet Take 1 tablet (40 mg total) by mouth 2 (two) times daily before a meal. 10/10/21 04/05/22  Daniel [name]  thiamine (VITAMIN B1) 100 MG tablet Take 1 tablet (100 mg total) by mouth daily. 12/12/21   Daniel [name]   traZODone (DESYREL) 50 MG tablet Take 1 tablet (50 mg total) by mouth daily as needed for sleep. Patient taking differently: Take 50 mg by mouth at bedtime as needed for sleep. 02/10/22   Daniel [name]  colchicine 0.6 MG tablet Take 0.5 tablets (0.3 mg total) by mouth 2 (two) times daily. 07/24/20 07/29/20  Daniel [name]  furosemide (LASIX) 40 MG tablet Take 1 tablet (40 mg total) by mouth daily. 07/25/19 02/09/20  Daniel [name]      Allergies    Seroquel [quetiapine] and Dilaudid [hydromorphone hcl]    Review of Systems   Review of Systems  All other systems reviewed and are negative.   Physical Exam Updated Vital Signs BP (!) 158/89 (BP Location: Right Arm)   Pulse 88   Temp 97.7 F (36.5 C)   Resp 18   SpO2 95%  Physical Exam Vitals and nursing note reviewed.  Constitutional:      General: He is not in acute distress.    Appearance: Normal appearance. He is well-developed.  HENT:     Head: Normocephalic and atraumatic.  Eyes:     Conjunctiva/sclera: Conjunctivae normal.     Pupils: Pupils are equal, round, and reactive to light.  Cardiovascular:     Rate and Rhythm: Normal rate and regular rhythm.     Heart sounds: Normal heart sounds.  Pulmonary:     Effort: Pulmonary effort is normal. No respiratory distress.     Breath sounds: Normal breath sounds.  Abdominal:     General: There is no distension.     Palpations: Abdomen is soft.     Tenderness: There is no abdominal tenderness.  Musculoskeletal:        General: No deformity. Normal range of motion.     Cervical back: Normal range of motion and neck supple.     Comments: S/P LLE BK amputation  Skin:    General: Skin is warm and dry.     Comments: No Rash, No appreciable abnormal skin findings  Neurological:     General: No focal deficit present.     Mental Status: He is alert and oriented to person, place, and time.     ED Results / Procedures / Treatments   Labs (all labs ordered are  listed, but only abnormal results are displayed) Labs Reviewed  CBC - Abnormal; Notable for the following components:      Result Value   WBC 3.8 (*)    RBC 2.41 (*)    Hemoglobin 8.0 (*)    HCT 24.2 (*)    MCV 100.4 (*)    Platelets 94 (*)    All other components within normal limits  COMPREHENSIVE METABOLIC PANEL - Abnormal; Notable for the following components:   Sodium 126 (*)    Chloride 90 (*)    BUN 26 (*)    Creatinine, Ser 6.99 (*)    Calcium 8.0 (*)    Albumin 2.0 (*)    AST 51 (*)    GFR, Estimated 8 (*)    All other components within normal limits  BRAIN NATRIURETIC PEPTIDE - Abnormal; Notable for the following components:   B Natriuretic Peptide 663.6 (*)    All other components within normal limits    EKG None  Radiology DG Tibia/Fibula Right  Result Date: 02/11/2022 [name] DATA:  Leg pain EXAM: RIGHT TIBIA AND FIBULA - 2 VIEW COMPARISON:  None Available. 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 No acute bony abnormality. No fracture, subluxation or dislocation. Diffuse vascular calcifications. IMPRESSION: No acute bony abnormality. Electronically Signed   By: Daniel Baptise M.D.   On: 02/11/2022 23:47    Procedures Procedures    Medications Ordered in ED Medications  loratadine (CLARITIN) tablet 10 mg (has no administration in time range)    ED Course/ Medical Decision Making/ A&P                           Medical Decision Making Risk OTC drugs.    Medical Screen Complete  This patient presented to the ED with complaint of itching.  This complaint involves an extensive number of treatment options. The initial differential diagnosis includes, but is not limited to, pruritus, metabolic abnormality  This presentation is: Acute, Chronic, Self-Limited, Previously Undiagnosed, Uncertain Prognosis, and Systemic Symptoms  Patient with complaint of diffuse itching.   Patient without significant  findings on exam.   Screening labs are not significantly deviated from baseline.   Patient educated regarding home management of symptoms.  Patient is due at his dialysis center in one hour. He is strongly encouraged to go directly to dialysis.  Strict return precautions given and understood.   Importance of close FU given and understood.  Co morbidities that complicated the patient's evaluation  ESRD on  HD   Additional history obtained:  External records from outside sources obtained and reviewed including prior ED visits and prior Inpatient records.    Lab Tests:  I ordered and personally interpreted labs.  The pertinent results include:  cbc cmp bnp   Imaging Studies ordered:  I ordered imaging studies including R Tib/Fib  I independently visualized and interpreted obtained imaging which showed NAD I agree with the radiologist interpretation.   Cardiac Monitoring:  The patient was maintained on a cardiac monitor.  I personally viewed and interpreted the cardiac monitor which showed an underlying rhythm of: NSR Problem List / ED Course:  Itching   Reevaluation:  After the interventions noted above, I reevaluated the patient and found that they have: improved   Disposition:  After consideration of the diagnostic results and the patients response to treatment, I feel that the patent would benefit from close outpatient followup.          Final [name] Impression(s) / ED Diagnoses Final diagnoses:  Itching    Rx / DC Orders ED Discharge Orders     None         Valarie Merino, [name] 02/12/22 2251

## 2022-02-12 NOTE — ED Notes (Signed)
AVS provided to and discussed with patient. Pt verbalizes understanding of discharge instructions and denies any questions or concerns at this time. Pt taken out of department via W/C.

## 2022-02-12 NOTE — Discharge Instructions (Addendum)
Return for any problem.  Go directly from the ED to dialysis.  You can try Claritin as prescribed for treatment of your itching.

## 2022-02-12 NOTE — Telephone Encounter (Signed)
Will forward to provider  

## 2022-02-13 NOTE — Telephone Encounter (Signed)
Returned [employer] call and left a detailed vm giving verbal orders and if she has any questions or concerns to give [place] a call

## 2022-02-14 ENCOUNTER — Inpatient Hospital Stay (HOSPITAL_COMMUNITY)
Admission: EM | Admit: 2022-02-14 | Discharge: 2022-02-21 | DRG: 377 | Disposition: A | Discharge status: Home / Self Care | Payer: Medicare Other | Attending: Internal Medicine | Admitting: Internal Medicine

## 2022-02-14 ENCOUNTER — Encounter (HOSPITAL_COMMUNITY): Payer: Self-pay

## 2022-02-14 ENCOUNTER — Other Ambulatory Visit: Payer: Self-pay

## 2022-02-14 DIAGNOSIS — Z6831 Body mass index (BMI) 31.0-31.9, adult: Secondary | ICD-10-CM

## 2022-02-14 DIAGNOSIS — K922 Gastrointestinal hemorrhage, unspecified: Secondary | ICD-10-CM | POA: Diagnosis not present

## 2022-02-14 DIAGNOSIS — E669 Obesity, unspecified: Secondary | ICD-10-CM | POA: Diagnosis present

## 2022-02-14 DIAGNOSIS — K219 Gastro-esophageal reflux disease without esophagitis: Secondary | ICD-10-CM | POA: Diagnosis present

## 2022-02-14 DIAGNOSIS — D61818 Other pancytopenia: Secondary | ICD-10-CM | POA: Diagnosis not present

## 2022-02-14 DIAGNOSIS — Z888 Allergy status to other drugs, medicaments and biological substances status: Secondary | ICD-10-CM

## 2022-02-14 DIAGNOSIS — I10 Essential (primary) hypertension: Secondary | ICD-10-CM | POA: Diagnosis present

## 2022-02-14 DIAGNOSIS — E1122 Type 2 diabetes mellitus with diabetic chronic kidney disease: Secondary | ICD-10-CM | POA: Diagnosis present

## 2022-02-14 DIAGNOSIS — F17210 Nicotine dependence, cigarettes, uncomplicated: Secondary | ICD-10-CM | POA: Diagnosis present

## 2022-02-14 DIAGNOSIS — N186 End stage renal disease: Secondary | ICD-10-CM

## 2022-02-14 DIAGNOSIS — K5521 Angiodysplasia of colon with hemorrhage: Principal | ICD-10-CM | POA: Diagnosis present

## 2022-02-14 DIAGNOSIS — Z8711 Personal history of peptic ulcer disease: Secondary | ICD-10-CM

## 2022-02-14 DIAGNOSIS — I272 Pulmonary hypertension, unspecified: Secondary | ICD-10-CM | POA: Diagnosis present

## 2022-02-14 DIAGNOSIS — Z89512 Acquired absence of left leg below knee: Secondary | ICD-10-CM

## 2022-02-14 DIAGNOSIS — F1010 Alcohol abuse, uncomplicated: Secondary | ICD-10-CM | POA: Diagnosis present

## 2022-02-14 DIAGNOSIS — K7469 Other cirrhosis of liver: Secondary | ICD-10-CM | POA: Diagnosis present

## 2022-02-14 DIAGNOSIS — D631 Anemia in chronic kidney disease: Secondary | ICD-10-CM | POA: Diagnosis present

## 2022-02-14 DIAGNOSIS — Z8505 Personal history of malignant neoplasm of liver: Secondary | ICD-10-CM

## 2022-02-14 DIAGNOSIS — Z8673 Personal history of transient ischemic attack (TIA), and cerebral infarction without residual deficits: Secondary | ICD-10-CM

## 2022-02-14 DIAGNOSIS — B1920 Unspecified viral hepatitis C without hepatic coma: Secondary | ICD-10-CM

## 2022-02-14 DIAGNOSIS — E876 Hypokalemia: Secondary | ICD-10-CM | POA: Diagnosis present

## 2022-02-14 DIAGNOSIS — Z992 Dependence on renal dialysis: Secondary | ICD-10-CM

## 2022-02-14 DIAGNOSIS — E1151 Type 2 diabetes mellitus with diabetic peripheral angiopathy without gangrene: Secondary | ICD-10-CM | POA: Diagnosis present

## 2022-02-14 DIAGNOSIS — N2581 Secondary hyperparathyroidism of renal origin: Secondary | ICD-10-CM | POA: Diagnosis present

## 2022-02-14 DIAGNOSIS — K7460 Unspecified cirrhosis of liver: Secondary | ICD-10-CM | POA: Diagnosis present

## 2022-02-14 DIAGNOSIS — R188 Other ascites: Secondary | ICD-10-CM

## 2022-02-14 DIAGNOSIS — I501 Left ventricular failure: Secondary | ICD-10-CM | POA: Diagnosis present

## 2022-02-14 DIAGNOSIS — I5033 Acute on chronic diastolic (congestive) heart failure: Secondary | ICD-10-CM | POA: Diagnosis present

## 2022-02-14 DIAGNOSIS — D62 Acute posthemorrhagic anemia: Secondary | ICD-10-CM | POA: Diagnosis present

## 2022-02-14 DIAGNOSIS — E871 Hypo-osmolality and hyponatremia: Secondary | ICD-10-CM | POA: Diagnosis not present

## 2022-02-14 DIAGNOSIS — I132 Hypertensive heart and chronic kidney disease with heart failure and with stage 5 chronic kidney disease, or end stage renal disease: Secondary | ICD-10-CM | POA: Diagnosis present

## 2022-02-14 DIAGNOSIS — Z79899 Other long term (current) drug therapy: Secondary | ICD-10-CM

## 2022-02-14 DIAGNOSIS — E782 Mixed hyperlipidemia: Secondary | ICD-10-CM | POA: Diagnosis present

## 2022-02-14 DIAGNOSIS — D6959 Other secondary thrombocytopenia: Secondary | ICD-10-CM | POA: Diagnosis present

## 2022-02-14 LAB — CBC WITH DIFFERENTIAL/PLATELET
Abs Immature Granulocytes: 0.01 10*3/uL (ref 0.00–0.07)
Basophils Absolute: 0 10*3/uL (ref 0.0–0.1)
Basophils Relative: 0 %
Eosinophils Absolute: 0 10*3/uL (ref 0.0–0.5)
Eosinophils Relative: 0 %
HCT: 22.4 % — ABNORMAL LOW (ref 39.0–52.0)
Hemoglobin: 7.6 g/dL — ABNORMAL LOW (ref 13.0–17.0)
Immature Granulocytes: 0 %
Lymphocytes Relative: 20 %
Lymphs Abs: 0.8 10*3/uL (ref 0.7–4.0)
MCH: 33.3 pg (ref 26.0–34.0)
MCHC: 33.9 g/dL (ref 30.0–36.0)
MCV: 98.2 fL (ref 80.0–100.0)
Monocytes Absolute: 0.5 10*3/uL (ref 0.1–1.0)
Monocytes Relative: 14 %
Neutro Abs: 2.6 10*3/uL (ref 1.7–7.7)
Neutrophils Relative %: 66 %
Platelets: 101 10*3/uL — ABNORMAL LOW (ref 150–400)
RBC: 2.28 MIL/uL — ABNORMAL LOW (ref 4.22–5.81)
RDW: 15.4 % (ref 11.5–15.5)
WBC: 3.9 10*3/uL — ABNORMAL LOW (ref 4.0–10.5)
nRBC: 0 % (ref 0.0–0.2)

## 2022-02-14 LAB — PROTIME-INR
INR: 1.2 (ref 0.8–1.2)
Prothrombin Time: 15.4 seconds — ABNORMAL HIGH (ref 11.4–15.2)

## 2022-02-14 LAB — COMPREHENSIVE METABOLIC PANEL
ALT: 17 U/L (ref 0–44)
AST: 53 U/L — ABNORMAL HIGH (ref 15–41)
Albumin: 2.1 g/dL — ABNORMAL LOW (ref 3.5–5.0)
Alkaline Phosphatase: 110 U/L (ref 38–126)
Anion gap: 9 (ref 5–15)
BUN: 26 mg/dL — ABNORMAL HIGH (ref 8–23)
CO2: 27 mmol/L (ref 22–32)
Calcium: 7.5 mg/dL — ABNORMAL LOW (ref 8.9–10.3)
Chloride: 93 mmol/L — ABNORMAL LOW (ref 98–111)
Creatinine, Ser: 4.81 mg/dL — ABNORMAL HIGH (ref 0.61–1.24)
GFR, Estimated: 13 mL/min — ABNORMAL LOW (ref >60)
Glucose, Bld: 96 mg/dL (ref 70–99)
Potassium: 3.3 mmol/L — ABNORMAL LOW (ref 3.5–5.1)
Sodium: 129 mmol/L — ABNORMAL LOW (ref 135–145)
Total Bilirubin: 1.4 mg/dL — ABNORMAL HIGH (ref 0.3–1.2)
Total Protein: 7.8 g/dL (ref 6.5–8.1)

## 2022-02-14 NOTE — ED Provider Triage Note (Signed)
Emergency Medicine Provider Triage Evaluation Note  Daniel [name] , a 61 y.o. male  was evaluated in triage.  Pt complains of melena.  Patient has a history of end-stage renal disease on hemodialysis.  States that he noted black stools today.  Admission earlier this year for same requiring blood transfusion.  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  Patient states he feels generally fatigued.  No lightheadedness, chest pain, syncope.  Review of Systems  Positive: Dark stool Negative: Fever  Physical Exam  BP (!) 152/87 (BP Location: Right Arm)   Pulse (!) 102   Temp 99.7 F (37.6 C) (Oral)   Resp 16   Ht '5\' 11"'$  (1.803 m)   Wt 108.7 kg   SpO2 96%   BMI 33.42 kg/m  Gen:   Awake, no distress   Resp:  Normal effort  MSK:   Moves extremities without difficulty  Other:  Mild tachycardia  Medical Decision Making  Medically screening exam initiated at 8:46 PM.  Appropriate orders placed.  Vitaly C Hauck was informed that the remainder of the evaluation will be completed by another provider, this initial triage assessment does not replace that evaluation, and the importance of remaining in the ED until their evaluation is complete.     Carlisle Cater, PA-C 02/14/22 2047

## 2022-02-14 NOTE — Telephone Encounter (Signed)
Will forward to provider  

## 2022-02-14 NOTE — ED Triage Notes (Signed)
BIBA from home for black blood in stool, 3 loose stools since 1830 today, dialysis pt was dialyzed today, was at [facility] earlier, upper abd pain

## 2022-02-14 NOTE — Telephone Encounter (Signed)
Contacted pt to go over provider response pt didn't answer and was unable to lvm   Pt returned call and went over provider response pt states he doesn't care and he doesn't want anything

## 2022-02-15 ENCOUNTER — Inpatient Hospital Stay (HOSPITAL_COMMUNITY): Payer: Medicare Other

## 2022-02-15 DIAGNOSIS — C220 Liver cell carcinoma: Secondary | ICD-10-CM

## 2022-02-15 DIAGNOSIS — K922 Gastrointestinal hemorrhage, unspecified: Secondary | ICD-10-CM | POA: Diagnosis present

## 2022-02-15 DIAGNOSIS — Z992 Dependence on renal dialysis: Secondary | ICD-10-CM | POA: Diagnosis not present

## 2022-02-15 DIAGNOSIS — F17210 Nicotine dependence, cigarettes, uncomplicated: Secondary | ICD-10-CM | POA: Diagnosis present

## 2022-02-15 DIAGNOSIS — D649 Anemia, unspecified: Secondary | ICD-10-CM

## 2022-02-15 DIAGNOSIS — K7460 Unspecified cirrhosis of liver: Secondary | ICD-10-CM | POA: Diagnosis present

## 2022-02-15 DIAGNOSIS — N186 End stage renal disease: Secondary | ICD-10-CM

## 2022-02-15 DIAGNOSIS — Z6831 Body mass index (BMI) 31.0-31.9, adult: Secondary | ICD-10-CM | POA: Diagnosis not present

## 2022-02-15 DIAGNOSIS — E782 Mixed hyperlipidemia: Secondary | ICD-10-CM

## 2022-02-15 DIAGNOSIS — E871 Hypo-osmolality and hyponatremia: Secondary | ICD-10-CM | POA: Diagnosis not present

## 2022-02-15 DIAGNOSIS — D62 Acute posthemorrhagic anemia: Secondary | ICD-10-CM | POA: Diagnosis present

## 2022-02-15 DIAGNOSIS — E876 Hypokalemia: Secondary | ICD-10-CM | POA: Diagnosis present

## 2022-02-15 DIAGNOSIS — I501 Left ventricular failure: Secondary | ICD-10-CM | POA: Diagnosis not present

## 2022-02-15 DIAGNOSIS — B1920 Unspecified viral hepatitis C without hepatic coma: Secondary | ICD-10-CM | POA: Diagnosis present

## 2022-02-15 DIAGNOSIS — R188 Other ascites: Secondary | ICD-10-CM | POA: Insufficient documentation

## 2022-02-15 DIAGNOSIS — E1122 Type 2 diabetes mellitus with diabetic chronic kidney disease: Secondary | ICD-10-CM | POA: Diagnosis present

## 2022-02-15 DIAGNOSIS — E1151 Type 2 diabetes mellitus with diabetic peripheral angiopathy without gangrene: Secondary | ICD-10-CM | POA: Diagnosis present

## 2022-02-15 DIAGNOSIS — D631 Anemia in chronic kidney disease: Secondary | ICD-10-CM | POA: Diagnosis present

## 2022-02-15 DIAGNOSIS — D61818 Other pancytopenia: Secondary | ICD-10-CM | POA: Diagnosis not present

## 2022-02-15 DIAGNOSIS — N2581 Secondary hyperparathyroidism of renal origin: Secondary | ICD-10-CM | POA: Diagnosis present

## 2022-02-15 DIAGNOSIS — K5521 Angiodysplasia of colon with hemorrhage: Secondary | ICD-10-CM | POA: Diagnosis present

## 2022-02-15 DIAGNOSIS — E669 Obesity, unspecified: Secondary | ICD-10-CM | POA: Diagnosis present

## 2022-02-15 DIAGNOSIS — F1010 Alcohol abuse, uncomplicated: Secondary | ICD-10-CM | POA: Diagnosis present

## 2022-02-15 DIAGNOSIS — I10 Essential (primary) hypertension: Secondary | ICD-10-CM

## 2022-02-15 DIAGNOSIS — K5520 Angiodysplasia of colon without hemorrhage: Secondary | ICD-10-CM

## 2022-02-15 DIAGNOSIS — K7469 Other cirrhosis of liver: Secondary | ICD-10-CM

## 2022-02-15 DIAGNOSIS — D6959 Other secondary thrombocytopenia: Secondary | ICD-10-CM | POA: Diagnosis present

## 2022-02-15 DIAGNOSIS — Z89512 Acquired absence of left leg below knee: Secondary | ICD-10-CM | POA: Diagnosis not present

## 2022-02-15 DIAGNOSIS — I272 Pulmonary hypertension, unspecified: Secondary | ICD-10-CM | POA: Diagnosis present

## 2022-02-15 DIAGNOSIS — I5033 Acute on chronic diastolic (congestive) heart failure: Secondary | ICD-10-CM

## 2022-02-15 DIAGNOSIS — K921 Melena: Secondary | ICD-10-CM | POA: Diagnosis not present

## 2022-02-15 DIAGNOSIS — I132 Hypertensive heart and chronic kidney disease with heart failure and with stage 5 chronic kidney disease, or end stage renal disease: Secondary | ICD-10-CM | POA: Diagnosis present

## 2022-02-15 LAB — CBC WITH DIFFERENTIAL/PLATELET
Abs Immature Granulocytes: 0.01 10*3/uL (ref 0.00–0.07)
Basophils Absolute: 0 10*3/uL (ref 0.0–0.1)
Basophils Relative: 1 %
Eosinophils Absolute: 0 10*3/uL (ref 0.0–0.5)
Eosinophils Relative: 0 %
HCT: 19 % — ABNORMAL LOW (ref 39.0–52.0)
Hemoglobin: 6.3 g/dL — CL (ref 13.0–17.0)
Immature Granulocytes: 0 %
Lymphocytes Relative: 27 %
Lymphs Abs: 1 10*3/uL (ref 0.7–4.0)
MCH: 33.3 pg (ref 26.0–34.0)
MCHC: 33.2 g/dL (ref 30.0–36.0)
MCV: 100.5 fL — ABNORMAL HIGH (ref 80.0–100.0)
Monocytes Absolute: 0.8 10*3/uL (ref 0.1–1.0)
Monocytes Relative: 21 %
Neutro Abs: 2 10*3/uL (ref 1.7–7.7)
Neutrophils Relative %: 51 %
Platelets: 94 10*3/uL — ABNORMAL LOW (ref 150–400)
RBC: 1.89 MIL/uL — ABNORMAL LOW (ref 4.22–5.81)
RDW: 15.5 % (ref 11.5–15.5)
WBC: 3.9 10*3/uL — ABNORMAL LOW (ref 4.0–10.5)
nRBC: 0 % (ref 0.0–0.2)

## 2022-02-15 LAB — COMPREHENSIVE METABOLIC PANEL
ALT: 17 U/L (ref 0–44)
AST: 52 U/L — ABNORMAL HIGH (ref 15–41)
Albumin: 1.9 g/dL — ABNORMAL LOW (ref 3.5–5.0)
Alkaline Phosphatase: 94 U/L (ref 38–126)
Anion gap: 9 (ref 5–15)
BUN: 38 mg/dL — ABNORMAL HIGH (ref 8–23)
CO2: 25 mmol/L (ref 22–32)
Calcium: 7.4 mg/dL — ABNORMAL LOW (ref 8.9–10.3)
Chloride: 97 mmol/L — ABNORMAL LOW (ref 98–111)
Creatinine, Ser: 4.91 mg/dL — ABNORMAL HIGH (ref 0.61–1.24)
GFR, Estimated: 13 mL/min — ABNORMAL LOW (ref >60)
Glucose, Bld: 99 mg/dL (ref 70–99)
Potassium: 3.5 mmol/L (ref 3.5–5.1)
Sodium: 131 mmol/L — ABNORMAL LOW (ref 135–145)
Total Bilirubin: 1.4 mg/dL — ABNORMAL HIGH (ref 0.3–1.2)
Total Protein: 6.8 g/dL (ref 6.5–8.1)

## 2022-02-15 LAB — PHOSPHORUS: Phosphorus: 3.3 mg/dL (ref 2.5–4.6)

## 2022-02-15 LAB — HEMOGLOBIN AND HEMATOCRIT, BLOOD
HCT: 19.8 % — ABNORMAL LOW (ref 39.0–52.0)
HCT: 25.7 % — ABNORMAL LOW (ref 39.0–52.0)
HCT: 24.1 % — ABNORMAL LOW (ref 39.0–52.0)
Hemoglobin: 6.6 g/dL — CL (ref 13.0–17.0)
Hemoglobin: 8.4 g/dL — ABNORMAL LOW (ref 13.0–17.0)
Hemoglobin: 8.5 g/dL — ABNORMAL LOW (ref 13.0–17.0)

## 2022-02-15 LAB — PREPARE RBC (CROSSMATCH)

## 2022-02-15 LAB — POC OCCULT BLOOD, ED: Fecal Occult Bld: POSITIVE — AB

## 2022-02-15 LAB — LIPASE, BLOOD: Lipase: 47 U/L (ref 11–51)

## 2022-02-15 LAB — MAGNESIUM: Magnesium: 1.8 mg/dL (ref 1.7–2.4)

## 2022-02-15 MED ORDER — LORAZEPAM 1 MG PO TABS: 1.0000 mg | ORAL_TABLET | ORAL | Status: DC | PRN

## 2022-02-15 MED ORDER — MELATONIN 5 MG PO TABS: 5.0000 mg | ORAL_TABLET | Freq: Every evening | ORAL | Status: DC | PRN

## 2022-02-15 MED ORDER — LORAZEPAM 2 MG/ML IJ SOLN: 1.0000 mg | INTRAMUSCULAR | Status: DC | PRN

## 2022-02-15 MED ORDER — AMLODIPINE BESYLATE 5 MG PO TABS
5.0000 mg | ORAL_TABLET | Freq: Every day | ORAL | Status: DC
  Administered 2022-02-16 – 2022-02-21 (×6): 5 mg via ORAL
  Filled 2022-02-15 (×6): qty 1

## 2022-02-15 MED ORDER — SODIUM CHLORIDE 0.9 % IV SOLN: Freq: Once | INTRAVENOUS | Status: AC

## 2022-02-15 MED ORDER — PROCHLORPERAZINE EDISYLATE 10 MG/2ML IJ SOLN
5.0000 mg | Freq: Four times a day (QID) | INTRAMUSCULAR | Status: DC | PRN
  Administered 2022-02-18: 5 mg via INTRAVENOUS
  Filled 2022-02-15: qty 2

## 2022-02-15 MED ORDER — FENTANYL CITRATE PF 50 MCG/ML IJ SOSY
50.0000 ug | PREFILLED_SYRINGE | Freq: Once | INTRAMUSCULAR | Status: AC
  Administered 2022-02-15: 50 ug via INTRAVENOUS
  Filled 2022-02-15: qty 1

## 2022-02-15 MED ORDER — SODIUM CHLORIDE 0.9% IV SOLUTION: Freq: Once | INTRAVENOUS | Status: DC

## 2022-02-15 MED ORDER — PANTOPRAZOLE 80MG IVPB - SIMPLE MED
80.0000 mg | Freq: Once | INTRAVENOUS | Status: AC
  Administered 2022-02-15: 80 mg via INTRAVENOUS
  Filled 2022-02-15: qty 80

## 2022-02-15 MED ORDER — ADULT MULTIVITAMIN W/MINERALS CH
1.0000 | ORAL_TABLET | Freq: Every day | ORAL | Status: DC
  Administered 2022-02-15 – 2022-02-21 (×7): 1 via ORAL
  Filled 2022-02-15 (×7): qty 1

## 2022-02-15 MED ORDER — SENNOSIDES-DOCUSATE SODIUM 8.6-50 MG PO TABS: 1.0000 | ORAL_TABLET | Freq: Every day | ORAL | Status: DC

## 2022-02-15 MED ORDER — ATORVASTATIN CALCIUM 40 MG PO TABS
40.0000 mg | ORAL_TABLET | Freq: Every day | ORAL | Status: DC
  Administered 2022-02-15 – 2022-02-20 (×6): 40 mg via ORAL
  Filled 2022-02-15 (×6): qty 1

## 2022-02-15 MED ORDER — HYDRALAZINE HCL 20 MG/ML IJ SOLN: 10.0000 mg | Freq: Four times a day (QID) | INTRAMUSCULAR | Status: DC | PRN

## 2022-02-15 MED ORDER — TRAZODONE HCL 50 MG PO TABS
50.0000 mg | ORAL_TABLET | Freq: Every day | ORAL | Status: DC | PRN
  Administered 2022-02-15: 50 mg via ORAL
  Filled 2022-02-15: qty 1

## 2022-02-15 MED ORDER — THIAMINE HCL 100 MG/ML IJ SOLN: 100.0000 mg | Freq: Every day | INTRAMUSCULAR | Status: DC

## 2022-02-15 MED ORDER — THIAMINE MONONITRATE 100 MG PO TABS
100.0000 mg | ORAL_TABLET | Freq: Every day | ORAL | Status: DC
  Administered 2022-02-15 – 2022-02-21 (×7): 100 mg via ORAL
  Filled 2022-02-15 (×7): qty 1

## 2022-02-15 MED ORDER — OXYCODONE HCL 5 MG PO TABS
5.0000 mg | ORAL_TABLET | Freq: Four times a day (QID) | ORAL | Status: DC | PRN
  Administered 2022-02-15 – 2022-02-21 (×5): 5 mg via ORAL
  Filled 2022-02-15 (×5): qty 1

## 2022-02-15 MED ORDER — FUROSEMIDE 10 MG/ML IJ SOLN
120.0000 mg | Freq: Once | INTRAVENOUS | Status: AC
  Administered 2022-02-15: 120 mg via INTRAVENOUS
  Filled 2022-02-15: qty 12

## 2022-02-15 MED ORDER — PANTOPRAZOLE SODIUM 40 MG IV SOLR: 40.0000 mg | Freq: Two times a day (BID) | INTRAVENOUS | Status: DC

## 2022-02-15 MED ORDER — SODIUM CHLORIDE 0.9 % IV SOLN
2.0000 g | INTRAVENOUS | Status: DC
  Administered 2022-02-15 – 2022-02-19 (×5): 2 g via INTRAVENOUS
  Filled 2022-02-15 (×5): qty 20

## 2022-02-15 MED ORDER — PANTOPRAZOLE INFUSION (NEW) - SIMPLE MED
8.0000 mg/h | INTRAVENOUS | Status: DC
  Administered 2022-02-15 – 2022-02-17 (×6): 8 mg/h via INTRAVENOUS
  Filled 2022-02-15: qty 100
  Filled 2022-02-15 (×2): qty 80
  Filled 2022-02-15 (×4): qty 100

## 2022-02-15 MED ORDER — POLYETHYLENE GLYCOL 3350 17 G PO PACK: 17.0000 g | PACK | Freq: Every day | ORAL | Status: DC | PRN

## 2022-02-15 MED ORDER — ACETAMINOPHEN 325 MG PO TABS
650.0000 mg | ORAL_TABLET | Freq: Four times a day (QID) | ORAL | Status: DC | PRN
  Administered 2022-02-17: 650 mg via ORAL
  Filled 2022-02-15: qty 2

## 2022-02-15 MED ORDER — HYDROMORPHONE HCL 1 MG/ML IJ SOLN
0.5000 mg | INTRAMUSCULAR | Status: DC | PRN
  Administered 2022-02-15 – 2022-02-21 (×8): 0.5 mg via INTRAVENOUS
  Filled 2022-02-15 (×4): qty 0.5
  Filled 2022-02-15 (×2): qty 1
  Filled 2022-02-15 (×3): qty 0.5

## 2022-02-15 MED ORDER — FOLIC ACID 1 MG PO TABS
1.0000 mg | ORAL_TABLET | Freq: Every day | ORAL | Status: DC
  Administered 2022-02-15 – 2022-02-21 (×7): 1 mg via ORAL
  Filled 2022-02-15 (×7): qty 1

## 2022-02-15 NOTE — Assessment & Plan Note (Addendum)
   We will resume home regimen of amlodipine as blood pressure tolerates  As needed intravenous hydralazine for markedly elevated blood pressure

## 2022-02-15 NOTE — ED Notes (Signed)
Bladder scan 387m per MD I&O cath

## 2022-02-15 NOTE — Assessment & Plan Note (Signed)
   Longstanding known history of cirrhosis secondary to hepatitis C  Numerous bleeding complications secondary to known history of cirrhosis  Patient additionally has known hepatocellular carcinoma status post Y90 ablation 11/28/2021  Ascites sp paracentesis.  Antibiotic therapy to prevent SBP Patient with no signs of alcohol withdrawal continue close monitoring.  Add prn lorazepam dc CIWA

## 2022-02-15 NOTE — ED Provider Notes (Signed)
[facility] DEPT Provider Note  CSN: [id number] Arrival date & time: 02/14/22 2030  Chief Complaint(s) Melena  HPI Daniel [name] is a 61 y.o. male with a past medical history listed below including ESRD on dialysis MWF, diabetes, recently diagnosed hepatocellular carcinoma several months ago has not started treatment yet, prior GI bleeds who has had numerous EGDs notable for peptic ulcers, AVMs and most recently in July of this year, grade 2 esophageal varices.   Abdominal Pain Pain location:  Epigastric Pain quality: aching   Pain severity:  Moderate Onset quality:  Gradual Duration:  4 weeks Timing:  Constant Progression:  Waxing and waning Context comment:  H/o HCC Relieved by:  Nothing Worsened by:  Eating Associated symptoms: melena (5 bouts today.)   Associated symptoms: no chest pain, no constipation, no diarrhea, no hematemesis, no hematochezia, no hematuria, no nausea and no vomiting     Past Medical History Past Medical History:  Diagnosis Date   Anemia    Diabetes mellitus without complication ([place])    patient denies   Dialysis patient ([place])    End stage chronic kidney disease ([place])    Hypertension    ICH (intracerebral hemorrhage) ([place]) 05/20/2017   PAD (peripheral artery disease) ([facility])    Shoulder pain, left 06/28/2013   Patient Active Problem List   Diagnosis Date Noted   Acute pulmonary edema ([place]) 01/02/2022   Bilateral pleural effusion 01/02/2022   Volume overload 12/25/2021   Acute on chronic anemia    ESRD on dialysis ([place])    Hyponatremia 12/09/2021   Mixed hyperlipidemia 11/13/2021   Musculoskeletal neck pain 11/13/2021   Acute blood loss anemia 11/12/2021   Iron deficiency anemia due to chronic blood loss 10/17/2021   Euthyroid sick syndrome 10/10/2021   Elevated troponin 10/10/2021   Recurrent pneumonia 10/08/2021   LLQ abdominal pain 10/08/2021   Physical deconditioning 10/08/2021   Cognitive decline  10/08/2021   Left flank pain 10/07/2021   Intestinal polyps 10/06/2021   Portal hypertensive gastropathy ([place]) 10/06/2021   Rotator cuff syndrome 10/05/2021   Epistaxis 10/05/2021   Hx of BKA, left ([place]) 10/05/2021   Recurrent gastrointestinal hemorrhage 09/28/2021   Edema of amputation stump of left lower extremity ([place]) 09/28/2021   Chronic pain disorder 09/28/2021   GI bleed 08/23/2021   Amputation stump infection ([place])    Sepsis ([place]) 08/16/2021   Right foot pain 08/07/2021   Right lower lobe lung mass 08/06/2021   Hepatocellular carcinoma ([name]) 08/06/2021   Melena    Pressure injury of skin 07/05/2021   Right leg pain 07/04/2021   GI bleeding 07/04/2021   Acute on chronic diastolic CHF (congestive heart failure) ([place]) 06/14/2021   GERD without esophagitis 06/14/2021   Chest pain 06/13/2021   Diabetic foot infection ([place]) 04/14/2021   PAD (peripheral artery disease) ([place]) 04/08/2021   Gastritis and gastroduodenitis    ESRD (end stage renal disease) on dialysis ([place]) 03/25/2021   Diabetes mellitus type 2, controlled, with complications ([place]) 67/03/4579   Alcohol abuse with intoxication ([place]) 03/25/2021   Cocaine abuse ([place]) 03/25/2021   Tobacco use disorder 03/25/2021   Class 2 obesity due to excess calories with body mass index (BMI) of 39.0 to 39.9 in adult 03/25/2021   AVM (arteriovenous malformation) of duodenum, acquired    Peptic ulcer disease    Upper GI bleed 03/24/2021   Chalazion of right upper eyelid 03/24/2021   Thrombocytopenia ([place]) 03/24/2021   Visual hallucination 03/24/2021   Bilateral  leg edema 03/24/2021   Benign neoplasm of duodenum, jejunum, and ileum    Anemia due to chronic kidney disease 02/23/2021   Rotator cuff tear arthropathy of right shoulder 01/23/2021   Alcohol dependence ([place]) 01/13/2021   Gastric ulcer with hemorrhage 01/13/2021   Generalized weakness    Transaminitis 01/10/2021   Alcohol abuse 10/27/2020   History of cocaine abuse  ([place]) 10/27/2020   Nicotine dependence, cigarettes, uncomplicated 24/12/7351   Acute GI bleeding 08/10/2020   Duodenal ulcer with hemorrhage    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 Authorizing Provider  amLODipine (NORVASC) 5 MG tablet Take 1 tablet (5 mg total) by mouth daily. 02/10/22 05/11/22  [name], NP  atorvastatin (LIPITOR) 40 MG tablet Take 1 tablet (40 mg total) by mouth daily. 09/18/21 12/26/22  [place] Donnamarie Poag, DO  folic acid (FOLVITE) 1 MG tablet Take 1 tablet (1 mg total) by mouth daily. 12/12/21   Eugenie Filler, MD  gabapentin (NEURONTIN) 300 MG capsule Take 300 mg by mouth 3 (three) times daily. 02/07/22   [provider]  loperamide (IMODIUM) 2 MG capsule Take 1 capsule (2 mg total) by mouth 4 (four) times daily as needed for diarrhea or loose stools. 01/31/22   Carmin Muskrat, MD  Multiple Vitamin (MULTIVITAMIN WITH MINERALS) TABS tablet Take 1 tablet by mouth daily. 12/12/21   Eugenie Filler, MD  pantoprazole (PROTONIX) 40 MG tablet Take 1 tablet (40 mg total) by mouth 2 (two) times daily before a meal. 10/10/21 04/05/22  Mercy Riding, MD  thiamine (VITAMIN B1) 100 MG tablet Take 1 tablet (100 mg total) by mouth daily. 12/12/21   Eugenie Filler, MD  traZODone (DESYREL) 50 MG tablet Take 1 tablet (50 mg total) by mouth daily as needed for sleep. Patient taking differently: Take 50 mg by mouth at bedtime as needed for sleep. 02/10/22   [name], NP  colchicine 0.6 MG tablet Take 0.5  tablets (0.3 mg total) by mouth 2 (two) times daily. 07/24/20 07/29/20  Noemi Chapel, MD  furosemide (LASIX) 40 MG tablet Take 1 tablet (40 mg total) by mouth daily. 07/25/19 02/09/20  Ladona Horns, MD                                                                                                                                    Allergies Seroquel [quetiapine] and Dilaudid [hydromorphone hcl]  Review of Systems Review of Systems  Cardiovascular:  Negative for  chest pain.  Gastrointestinal:  Positive for abdominal pain and melena (5 bouts today.). Negative for constipation, diarrhea, hematemesis, hematochezia, nausea and vomiting.  Genitourinary:  Negative for hematuria.   As noted in HPI  Physical Exam Vital Signs  I have reviewed the triage vital signs BP (!) 136/53   Pulse (!) 104   Temp 98.4 F (36.9 C) (Oral)   Resp 20   Ht '5\' 11"'$  (1.803 m)   Wt 108.7 kg   SpO2 94%   BMI 33.42 kg/m   Physical Exam Vitals reviewed.  Constitutional:      General: He is not in acute distress.    Appearance: He is well-developed. He is not diaphoretic.  HENT:     Head: Normocephalic and atraumatic.     Right Ear: External ear normal.     Left Ear: External ear normal.     Nose: Nose normal.     Mouth/Throat:     Mouth: Mucous membranes are moist.  Eyes:     General: No scleral icterus.    Conjunctiva/sclera: Conjunctivae normal.  Neck:     Trachea: Phonation normal.  Cardiovascular:     Rate and Rhythm: Normal rate and regular rhythm.  Pulmonary:     Effort: Pulmonary effort is normal. No respiratory distress.     Breath sounds: No stridor.  Abdominal:     General: There is distension.     Palpations: There is fluid wave and hepatomegaly.     Tenderness: There is abdominal tenderness (mild) in the epigastric area and left upper quadrant. There is no guarding or rebound.  Genitourinary:    Comments: Melenic stool noted on DRE Musculoskeletal:        General: Normal range of  motion.     Cervical back: Normal range of motion.  Neurological:     Mental Status: He is alert and oriented to person, place, and time.  Psychiatric:        Behavior: Behavior normal.     ED Results and Treatments Labs (all labs ordered are listed, but only abnormal results are displayed) Labs Reviewed  COMPREHENSIVE METABOLIC PANEL - Abnormal; Notable for the following components:      Result Value   Sodium 129 (*)    Potassium 3.3 (*)    Chloride 93 (*)    BUN 26 (*)    Creatinine, Ser 4.81 (*)    Calcium 7.5 (*)    Albumin 2.1 (*)    AST 53 (*)    Total Bilirubin 1.4 (*)    GFR, Estimated 13 (*)    All other components within normal limits  CBC WITH DIFFERENTIAL/PLATELET - Abnormal; Notable for the following components:   WBC 3.9 (*)    RBC 2.28 (*)    Hemoglobin 7.6 (*)    HCT 22.4 (*)    Platelets 101 (*)    All other components within normal limits  PROTIME-INR - Abnormal; Notable for the following components:   Prothrombin Time 15.4 (*)    All other components within normal limits  POC OCCULT BLOOD, ED - Abnormal; Notable for the following components:   Fecal Occult Bld POSITIVE (*)    All other components within normal limits  HEMOGLOBIN AND HEMATOCRIT, BLOOD  HEMOGLOBIN AND HEMATOCRIT, BLOOD  HEMOGLOBIN AND HEMATOCRIT, BLOOD  TYPE AND SCREEN  EKG  EKG Interpretation  Date/Time:    Ventricular Rate:    PR Interval:    QRS Duration:   QT Interval:    QTC Calculation:   R Axis:     Text Interpretation:         Radiology No results found.  Medications Ordered in ED Medications  pantoprozole (PROTONIX) 80 mg /NS 100 mL infusion (8 mg/hr Intravenous New Bag/Given 02/15/22 0154)  pantoprazole (PROTONIX) 80 mg /NS 100 mL IVPB (0 mg Intravenous Stopped 02/15/22 0230)  fentaNYL (SUBLIMAZE) injection 50 mcg (50 mcg Intravenous Given  02/15/22 0149)  0.9 %  sodium chloride infusion ( Intravenous New Bag/Given 02/15/22 0151)                                                                                                                                     Procedures Procedures  (including critical care time)  Medical Decision Making / ED Course   Medical Decision Making Amount and/or Complexity of Data Reviewed External Data Reviewed: radiology and notes.    Details: EGD mentioned in HPI Labs: ordered. Decision-making details documented in ED Course.  Risk Prescription drug management. Decision regarding hospitalization.    Patient presents with several bouts of melenic stools today. Has been having several months of epigastric abdominal discomfort. On DRE, patient did have melenic stools that were positive on Hemoccult. Given past medical history of prior AVMs, ulcers and esophageal varices, there is a concern for upper GI bleed. She is not anticoagulated but does have cirrhosis and hepatocellular carcinoma  CBC notable for hemoglobin of 7.6 that has been trending down over the past 2 weeks. CMP notable for mild hyponatremia, hypochloremia, hypocalcemia.  Renal function consistent with ESRD.  INR normal  Patient provided with saline infusion, pain medicine. Started on protonix.  Consulted with Dr. [place] Crane from hospitalist service who will admit patient for further work-up and management.       Final Clinical Impression(s) / ED Diagnoses Final diagnoses:  Upper GI bleeding           This chart was dictated using voice recognition software.  Despite best efforts to proofread,  errors can occur which can change the documentation meaning.    Fatima Blank, MD 02/15/22 [phone]

## 2022-02-15 NOTE — ED Notes (Signed)
Blood consent signed

## 2022-02-15 NOTE — H&P (Addendum)
History and Physical  Daniel [name] EXB:[id number] DOB: 20-Mar-1961 DOA: 02/14/2022  Referring physician: Dr. Leonette Monarch, [place]  PCP: [name], NP  Outpatient Specialists: Nephrology Patient coming from: Home  Chief Complaint: Abdominal pain and dark stools.  HPI: Daniel [name] is a 61 y.o. male with medical history significant for ESRD on HD MWF, type 2 diabetes, recently diagnosed hepatocellular carcinoma several months ago, not on treatment, history of GI bleed post multiple EGD's with findings of congestive gastropathy, AVMs, and most recently grade 1 esophageal varices in July 2023, who presented to [facility] ED from home due to diffuse abdominal pain, worsening abdominal distention, and dark stools recurring in the ED.  Associated with dyspnea with minimal exertion.  Denies use of NSAIDs.  Last hemodialysis was on Friday, 02/14/2022.  In the ED, work-up revealed acute blood loss anemia, with hemoglobin of 7.6, dropped to 6.6, positive FOBT.  The patient was started on Protonix drip by EDP.  1 unit PRBCs ordered to be transfused with the patient's consent.  Admitted by [facility], hospitalist service.  ED Course: Tmax 99.7.  BP 159/86, pulse 98, respiration rate 20, O2 saturation 94% on room air.  Lab studies remarkable for serum sodium 129, serum potassium 3.3, BUN 26, creatinine 4.81, albumin 2.1.  AST 53.  WBC 3.9, hemoglobin 6.3, MCV 100.5, platelet 94.  INR 1.2.  Review of Systems: Review of systems as noted in the HPI. All other systems reviewed and are negative.   Past Medical History:  Diagnosis Date   Anemia    Diabetes mellitus without complication [facility])    patient denies   Dialysis patient [facility])    End stage chronic kidney disease ([place])    Hypertension    ICH (intracerebral hemorrhage) ([place]) 05/20/2017   PAD (peripheral artery disease) (H[facility])    Shoulder pain, left 06/28/2013   Past Surgical History:  Procedure Laterality Date   A/V FISTULAGRAM N/A 08/15/2020    Procedure: A/V FISTULAGRAM - Left Upper;  Surgeon: Cherre Robins, MD;  Location: [place] CV LAB;  Service: Cardiovascular;  Laterality: N/A;   ABDOMINAL AORTOGRAM W/LOWER EXTREMITY N/A 04/08/2021   Procedure: ABDOMINAL AORTOGRAM W/LOWER EXTREMITY;  Surgeon: Waynetta Sandy, MD;  Location: [place] CV LAB;  Service: Cardiovascular;  Laterality: N/A;   AMPUTATION Left 04/16/2021   Procedure: LEFT BELOW KNEE AMPUTATION;  Surgeon: Angelia Mould, MD;  Location: [place];  Service: Vascular;  Laterality: Left;   AMPUTATION Left 06/17/2021   Procedure: REVISION AMPUTATION BELOW KNEE;  Surgeon: Broadus John, MD;  Location: [place];  Service: Vascular;  Laterality: Left;   APPENDECTOMY     APPLICATION OF WOUND VAC Left 06/17/2021   Procedure: APPLICATION OF WOUND VAC;  Surgeon: Broadus John, MD;  Location: [place];  Service: Vascular;  Laterality: Left;   AV FISTULA PLACEMENT Left 08/03/2019   Procedure: LEFT ARM ARTERIOVENOUS (AV) CEPHALIC  FISTULA CREATION;  Surgeon: Waynetta Sandy, MD;  Location: [place];  Service: Vascular;  Laterality: Left;   BIOPSY  06/30/2019   Procedure: BIOPSY;  Surgeon: Ronald Lobo, MD;  Location: [facility] ENDOSCOPY;  Service: Endoscopy;;   BIOPSY  08/02/2019   Procedure: BIOPSY;  Surgeon: Yetta Flock, MD;  Location: [facility] ENDOSCOPY;  Service: Gastroenterology;;   BIOPSY  02/08/2021   Procedure: BIOPSY;  Surgeon: Sharyn Creamer, MD;  Location: [facility] ENDOSCOPY;  Service: Gastroenterology;;   BIOPSY  02/24/2021   Procedure: BIOPSY;  Surgeon: [name], MD;  Location:  [facility] ENDOSCOPY;  Service: Gastroenterology;;   BIOPSY  03/26/2021   Procedure: BIOPSY;  Surgeon: Lavena Bullion, DO;  Location: [place];  Service: Gastroenterology;;   BIOPSY  08/06/2021   Procedure: BIOPSY;  Surgeon: [name], MD;  Location: [place];  Service: Gastroenterology;;   BIOPSY  09/15/2021   Procedure: BIOPSY;  Surgeon: Sharyn Creamer, MD;   Location: [place];  Service: Gastroenterology;;   BRONCHIAL BIOPSY  12/26/2021   Procedure: BRONCHIAL BIOPSIES;  Surgeon: Juanito Doom, MD;  Location: [place];  Service: Cardiopulmonary;;   BRONCHIAL WASHINGS  12/26/2021   Procedure: BRONCHIAL WASHINGS;  Surgeon: Juanito Doom, MD;  Location: [place];  Service: Cardiopulmonary;;   COLONOSCOPY  01/23/2012   Procedure: COLONOSCOPY;  Surgeon: Danie Binder, MD;  [facility];  Service: Endoscopy;  Laterality: N/A;  11:10 AM   COLONOSCOPY WITH PROPOFOL N/A 06/30/2019   Procedure: COLONOSCOPY WITH PROPOFOL;  Surgeon: Ronald Lobo, MD;  Location: [place];  Service: Endoscopy;  Laterality: N/A;   ENTEROSCOPY N/A 08/02/2019   Procedure: ENTEROSCOPY;  Surgeon: Yetta Flock, MD;  Location: [facility] ENDOSCOPY;  Service: Gastroenterology;  Laterality: N/A;   ENTEROSCOPY N/A 02/24/2021   Procedure: ENTEROSCOPY;  Surgeon: [name], MD;  Location: [facility] ENDOSCOPY;  Service: Gastroenterology;  Laterality: N/A;   ENTEROSCOPY N/A 03/26/2021   Procedure: ENTEROSCOPY;  Surgeon: Lavena Bullion, DO;  Location: [place];  Service: Gastroenterology;  Laterality: N/A;   ENTEROSCOPY N/A 07/05/2021   Procedure: ENTEROSCOPY;  Surgeon: Carol Ada, MD;  Location: [place];  Service: Gastroenterology;  Laterality: N/A;   ENTEROSCOPY N/A 08/06/2021   Procedure: ENTEROSCOPY;  Surgeon: [name], MD;  Location: [facility] ENDOSCOPY;  Service: Gastroenterology;  Laterality: N/A;   ENTEROSCOPY N/A 08/24/2021   Procedure: ENTEROSCOPY;  Surgeon: Ladene Artist, MD;  Location: [facility] ENDOSCOPY;  Service: Gastroenterology;  Laterality: N/A;   ENTEROSCOPY N/A 09/15/2021   Procedure: ENTEROSCOPY;  Surgeon: Sharyn Creamer, MD;  Location: [facility] ENDOSCOPY;  Service: Gastroenterology;  Laterality: N/A;   ENTEROSCOPY N/A 10/06/2021   Procedure: ENTEROSCOPY;  Surgeon: [name], MD;  Location: [facility] ENDOSCOPY;  Service:  Gastroenterology;  Laterality: N/A;   ENTEROSCOPY N/A 11/14/2021   Procedure: ENTEROSCOPY;  Surgeon: Doran Stabler, MD;  Location: [facility] ENDOSCOPY;  Service: Gastroenterology;  Laterality: N/A;   ESOPHAGOGASTRODUODENOSCOPY N/A 08/10/2020   Procedure: ESOPHAGOGASTRODUODENOSCOPY (EGD);  Surgeon: Jerene Bears, MD;  Location: [facility] ENDOSCOPY;  Service: Gastroenterology;  Laterality: N/A;   ESOPHAGOGASTRODUODENOSCOPY (EGD) WITH PROPOFOL N/A 06/30/2019   Procedure: ESOPHAGOGASTRODUODENOSCOPY (EGD) WITH PROPOFOL;  Surgeon: Ronald Lobo, MD;  Location: [place];  Service: Endoscopy;  Laterality: N/A;   ESOPHAGOGASTRODUODENOSCOPY (EGD) WITH PROPOFOL N/A 01/12/2021   Procedure: ESOPHAGOGASTRODUODENOSCOPY (EGD) WITH PROPOFOL;  Surgeon: Sharyn Creamer, MD;  Location: [place];  Service: Gastroenterology;  Laterality: N/A;   ESOPHAGOGASTRODUODENOSCOPY (EGD) WITH PROPOFOL N/A 02/08/2021   Procedure: ESOPHAGOGASTRODUODENOSCOPY (EGD) WITH PROPOFOL;  Surgeon: Sharyn Creamer, MD;  Location: [place];  Service: Gastroenterology;  Laterality: N/A;   FISTULA SUPERFICIALIZATION Left 10/17/2019   Procedure: LEFT UPPER EXTREMITY FISTULA REVISION, SIDE BRANCH LIGATION,  AND SUPERFICIALIZATION;  Surgeon: Marty Heck, MD;  Location: [place];  Service: Vascular;  Laterality: Left;   FISTULA SUPERFICIALIZATION Left 16/01/9603   Procedure: PLICATION OF ANEURYSM LEFT ARTERIOVENOUS FISTULA;  Surgeon: Angelia Mould, MD;  Location: [place];  Service: Vascular;  Laterality: Left;   FLEXIBLE SIGMOIDOSCOPY N/A 07/05/2021   Procedure: FLEXIBLE SIGMOIDOSCOPY;  Surgeon: Carol Ada, MD;  Location: [place];  Service: Gastroenterology;  Laterality: N/A;   GIVENS CAPSULE STUDY N/A 06/30/2019   Procedure: GIVENS CAPSULE STUDY;  Surgeon: Ronald Lobo, MD;  Location: [place];  Service: Endoscopy;  Laterality: N/A;   GIVENS CAPSULE STUDY N/A 08/25/2021   Procedure: GIVENS CAPSULE STUDY;  Surgeon: Ladene Artist, MD;  Location: [place];  Service: Gastroenterology;  Laterality: N/A;   GIVENS CAPSULE STUDY N/A 10/06/2021   Procedure: GIVENS CAPSULE STUDY;  Surgeon: [name], MD;  Location: [place];  Service: Gastroenterology;  Laterality: N/A;   HEMOSTASIS CLIP PLACEMENT  08/10/2020   Procedure: HEMOSTASIS CLIP PLACEMENT;  Surgeon: Jerene Bears, MD;  Location: [place];  Service: Gastroenterology;;   HEMOSTASIS CLIP PLACEMENT  01/12/2021   Procedure: HEMOSTASIS CLIP PLACEMENT;  Surgeon: Sharyn Creamer, MD;  Location: [place];  Service: Gastroenterology;;   HEMOSTASIS CONTROL  08/02/2019   Procedure: HEMOSTASIS CONTROL;  Surgeon: Yetta Flock, MD;  Location: [place];  Service: Gastroenterology;;   HOT HEMOSTASIS  02/24/2021   Procedure: HOT HEMOSTASIS (ARGON PLASMA COAGULATION/BICAP);  Surgeon: [name], MD;  Location: [facility] ENDOSCOPY;  Service: Gastroenterology;;   HOT HEMOSTASIS N/A 03/26/2021   Procedure: HOT HEMOSTASIS (ARGON PLASMA COAGULATION/BICAP);  Surgeon: Lavena Bullion, DO;  Location: [facility] ENDOSCOPY;  Service: Gastroenterology;  Laterality: N/A;   HOT HEMOSTASIS N/A 07/05/2021   Procedure: HOT HEMOSTASIS (ARGON PLASMA COAGULATION/BICAP);  Surgeon: Carol Ada, MD;  Location: [place];  Service: Gastroenterology;  Laterality: N/A;   HOT HEMOSTASIS N/A 09/15/2021   Procedure: HOT HEMOSTASIS (ARGON PLASMA COAGULATION/BICAP);  Surgeon: Sharyn Creamer, MD;  Location: [place];  Service: Gastroenterology;  Laterality: N/A;   HOT HEMOSTASIS N/A 10/06/2021   Procedure: HOT HEMOSTASIS (ARGON PLASMA COAGULATION/BICAP);  Surgeon: [name], MD;  Location: [place];  Service: Gastroenterology;  Laterality: N/A;   INCISION AND DRAINAGE ABSCESS N/A 06/29/2016   Procedure: INCISION AND DRAINAGE ABDOMINAL WALL ABSCESS;  Surgeon: Alphonsa Overall, MD;  Location: [facility];  Service: General;  Laterality: N/A;   INSERTION OF DIALYSIS CATHETER  Right 08/03/2019   Procedure: INSERTION OF DIALYSIS CATHETER;  Surgeon: Waynetta Sandy, MD;  Location: [place];  Service: Vascular;  Laterality: Right;   INSERTION OF DIALYSIS CATHETER Right 10/22/2019   Procedure: INSERTION OF 23CM TUNNELED DIALYSIS CATHETER RIGHT INTERNAL JUGULAR;  Surgeon: Angelia Mould, MD;  Location: [place];  Service: Vascular;  Laterality: Right;   INSERTION OF DIALYSIS CATHETER Right 08/12/2020   Procedure: INSERTION OF Right internal Jugular TUNNELED  DIALYSIS CATHETER.;  Surgeon: Waynetta Sandy, MD;  Location: [place];  Service: Vascular;  Laterality: Right;   INSERTION OF DIALYSIS CATHETER N/A 04/19/2021   Procedure: INSERTION OF TUNNELED DIALYSIS CATHETER;  Surgeon: Angelia Mould, MD;  Location: [place];  Service: Vascular;  Laterality: N/A;   IR 3D INDEPENDENT WKST  11/07/2021   IR ANGIOGRAM SELECTIVE EACH ADDITIONAL VESSEL  11/07/2021   IR ANGIOGRAM SELECTIVE EACH ADDITIONAL VESSEL  11/07/2021   IR ANGIOGRAM SELECTIVE EACH ADDITIONAL VESSEL  11/07/2021   IR ANGIOGRAM SELECTIVE EACH ADDITIONAL VESSEL  11/28/2021   IR ANGIOGRAM VISCERAL SELECTIVE  11/07/2021   IR ANGIOGRAM VISCERAL SELECTIVE  11/28/2021   IR EMBO ARTERIAL NOT HEMORR HEMANG INC GUIDE ROADMAPPING  11/07/2021   IR EMBO TUMOR ORGAN ISCHEMIA INFARCT INC GUIDE ROADMAPPING  11/28/2021   IR RADIOLOGIST EVAL & MGMT  09/12/2021   IR US GUIDE VASC ACCESS LEFT  11/07/2021  IR US GUIDE VASC ACCESS LEFT  11/28/2021   Left heel surgery     LOWER EXTREMITY ANGIOGRAPHY N/A 07/08/2021   Procedure: Lower Extremity Angiography;  Surgeon: Cherre Robins, MD;  Location: [place] CV LAB;  Service: Cardiovascular;  Laterality: N/A;   PENILE BIOPSY N/A 03/26/2020   Procedure: PENILE ULCER DEBRIDEMENT;  Surgeon: Remi Haggard, MD;  Location: [facility];  Service: Urology;  Laterality: N/A;  30 MINS   PERIPHERAL VASCULAR INTERVENTION Right 07/08/2021   Procedure: PERIPHERAL VASCULAR INTERVENTION;  Surgeon:  Cherre Robins, MD;  Location: [place] CV LAB;  Service: Cardiovascular;  Laterality: Right;   SCLEROTHERAPY  01/12/2021   Procedure: SCLEROTHERAPY;  Surgeon: Sharyn Creamer, MD;  Location: [facility] ENDOSCOPY;  Service: Gastroenterology;;   Clide Deutscher  02/24/2021   Procedure: Clide Deutscher;  Surgeon: [name], MD;  Location: [place];  Service: Gastroenterology;;   SUBMUCOSAL TATTOO INJECTION  08/24/2021   Procedure: SUBMUCOSAL TATTOO INJECTION;  Surgeon: Ladene Artist, MD;  Location: [place];  Service: Gastroenterology;;   SUBMUCOSAL TATTOO INJECTION  09/15/2021   Procedure: SUBMUCOSAL TATTOO INJECTION;  Surgeon: Sharyn Creamer, MD;  Location: [place];  Service: Gastroenterology;;   VIDEO BRONCHOSCOPY N/A 12/26/2021   Procedure: VIDEO BRONCHOSCOPY WITH FLUORO;  Surgeon: Juanito Doom, MD;  Location: [place];  Service: Cardiopulmonary;  Laterality: N/A;    Social History:  reports that he has been smoking cigarettes. He has a 22.50 pack-year smoking history. He has never used smokeless tobacco. He reports that he does not currently use alcohol. He reports that he does not currently use drugs after having used the following drugs: "Crack" cocaine.   Allergies  Allergen Reactions   Seroquel [Quetiapine] Other (See Comments)    Tardive kinesia Dystonia    Dilaudid [Hydromorphone Hcl] Itching and Other (See Comments)    Pt reports itchiness after IM injection     Family History  Problem Relation Age of Onset   Colon cancer Neg Hx       Prior to Admission medications   Medication Sig Start Date End Date Taking? Authorizing Provider  amLODipine (NORVASC) 5 MG tablet Take 1 tablet (5 mg total) by mouth daily. 02/10/22 05/11/22  [name], NP  atorvastatin (LIPITOR) 40 MG tablet Take 1 tablet (40 mg total) by mouth daily. 09/18/21 12/26/22  [place] Donnamarie Poag, DO  folic acid (FOLVITE) 1 MG tablet Take 1 tablet (1 mg total) by mouth daily. 12/12/21    Eugenie Filler, MD  gabapentin (NEURONTIN) 300 MG capsule Take 300 mg by mouth 3 (three) times daily. 02/07/22   [provider]  loperamide (IMODIUM) 2 MG capsule Take 1 capsule (2 mg total) by mouth 4 (four) times daily as needed for diarrhea or loose stools. 01/31/22   Carmin Muskrat, MD  Multiple Vitamin (MULTIVITAMIN WITH MINERALS) TABS tablet Take 1 tablet by mouth daily. 12/12/21   Eugenie Filler, MD  pantoprazole (PROTONIX) 40 MG tablet Take 1 tablet (40 mg total) by mouth 2 (two) times daily before a meal. 10/10/21 04/05/22  Mercy Riding, MD  thiamine (VITAMIN B1) 100 MG tablet Take 1 tablet (100 mg total) by mouth daily. 12/12/21   Eugenie Filler, MD  traZODone (DESYREL) 50 MG tablet Take 1 tablet (50 mg total) by mouth daily as needed for sleep. Patient taking differently: Take 50 mg by mouth at bedtime as needed for sleep. 02/10/22   [name], NP  colchicine 0.6 MG  tablet Take 0.5 tablets (0.3 mg total) by mouth 2 (two) times daily. 07/24/20 07/29/20  Noemi Chapel, MD  furosemide (LASIX) 40 MG tablet Take 1 tablet (40 mg total) by mouth daily. 07/25/19 02/09/20  Ladona Horns, MD    Physical Exam: BP (!) 136/53   Pulse (!) 104   Temp 98.4 F (36.9 C) (Oral)   Resp 20   Ht '5\' 11"'$  (1.803 m)   Wt 108.7 kg   SpO2 94%   BMI 33.42 kg/m   General: 61 y.o. year-old male well developed well nourished in no acute distress.  Alert and oriented x3. Cardiovascular: Regular rate and rhythm with no rubs or gallops.  No thyromegaly or JVD noted.  Trace lower extremity edema.   Respiratory: Clear to auscultation with no wheezes or rales. Good inspiratory effort. Abdomen: Distended with normal bowel sounds x4 quadrants. Muskuloskeletal: No cyanosis or clubbing.  Trace edema noted bilaterally.  Left below the knee amputation. Neuro: CN II-XII intact, strength, sensation, reflexes Skin: No ulcerative lesions noted or rashes Psychiatry: Judgement and insight appear  normal. Mood is appropriate for condition and setting          Labs on Admission:  Basic Metabolic Panel: Recent Labs  Lab 02/11/22 2321 02/14/22 2100  NA 126* 129*  K 3.9 3.3*  CL 90* 93*  CO2 24 27  GLUCOSE 86 96  BUN 26* 26*  CREATININE 6.99* 4.81*  CALCIUM 8.0* 7.5*   Liver Function Tests: Recent Labs  Lab 02/11/22 2321 02/14/22 2100  AST 51* 53*  ALT 18 17  ALKPHOS 118 110  BILITOT 1.2 1.4*  PROT 7.8 7.8  ALBUMIN 2.0* 2.1*   No results for input(s): "LIPASE", "AMYLASE" in the last 168 hours. No results for input(s): "AMMONIA" in the last 168 hours. CBC: Recent Labs  Lab 02/11/22 2321 02/14/22 2100  WBC 3.8* 3.9*  NEUTROABS  --  2.6  HGB 8.0* 7.6*  HCT 24.2* 22.4*  MCV 100.4* 98.2  PLT 94* 101*   Cardiac Enzymes: No results for input(s): "CKTOTAL", "CKMB", "CKMBINDEX", "TROPONINI" in the last 168 hours.  BNP (last 3 results) Recent Labs    10/08/21 1201 01/02/22 0655 02/11/22 2321  BNP 1,688.9* 1,034.3* 663.6*    ProBNP (last 3 results) No results for input(s): "PROBNP" in the last 8760 hours.  CBG: No results for input(s): "GLUCAP" in the last 168 hours.  Radiological Exams on Admission: No results found.  EKG: I independently viewed the EKG done and my findings are as followed: None available at the time of the visit.  Assessment/Plan Present on Admission:  GI bleed  Active Problems:   GI bleed  GI bleed in the setting of hepatic cirrhosis and hepatocellular carcinoma Recurrent GI bleed History of previous GI bleed post multiple EGDs. History of congestive gastropathy, AVMs, grade 1 esophageal varices. Recurrent melanotic stools in the ED Add Rocephin given GI bleeding in the setting of cirrhosis Denies use of NSAIDs. Follows with GI [place]. N.p.o. until seen by GI.  Acute blood loss anemia in the setting of suspected upper GI bleed Hemoglobin down to 6.6 from 7.6 on presentation Ongoing melena in the ED. Continue Protonix  drip 2 units PRBCs ordered to be transfused.  ESRD HD MWF Last hemodialysis on day of admission Consult nephrology in the morning to continue hemodialysis while inpatient. Volume status and electrolytes managed with hemodialysis.  Recently diagnosed hepatocellular carcinoma Has not started treatment. Consider medical oncology during the course of this hospitalization.  DVT prophylaxis: SCDs.  Chemical DVT prophylaxis contraindicated in the setting of GI bleed.  Code Status: Full code.  Family Communication: None at bedside.  Disposition Plan: Admitted to progressive care unit at [place] called: GI  Admission status: Inpatient status.   Status is: Inpatient The patient requires at least 2 midnights for further evaluation and treatment of present condition.   Kayleen Memos MD [facility] Pager [phone]  If 7PM-7AM, please contact night-coverage [email or web address] Password [facility]  02/15/2022, 2:37 AM

## 2022-02-15 NOTE — Assessment & Plan Note (Signed)
   Patient receives dialysis Monday Wednesday and Friday  nephrology will need to be consulted upon arrival to [facility], possibly for urgent dialysis depending on patient's clinical course after blood transfusion and intravenous Lasix administration

## 2022-02-15 NOTE — ED Notes (Signed)
[facility] arrived to transport pt 

## 2022-02-15 NOTE — Hospital Course (Addendum)
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, S/P Y90 ablation 11/28/2021), ongoing alcohol abuse, iron deficiency anemia, ESRD (HD MWF), history of subarachnoid hemorrhage (2019), peripheral vascular disease (S/P right SFA stent), status post left BKA, diastolic congestive heart failure, pulmonary hypertension, nicotine dependence (cigarettes), gastroesophageal reflux disease who presented to [facility] emergency department with complaints of abdominal pain and dark stools. Upon evaluation the patient was felt to be suffering from a gastrointestinal bleed considering patient's complicated history of GI bleeding in the past.  Patient was found to have a hemoglobin of 6.6 with several additional episodes of melena noted in the ED- 2 unit packed red blood cell transfusion was ordered and 10/22 transferred to [facility] managed for acute upper GI bleed ascites with liver cirrhosis secondary to chronic diastolic CHF. S/p IR paracentesis 250 cc seen by nephro and had HD 10/23 Again repeat paracentesis 2.2 L 10/25.  Seen by GI no plan for endoscopic evaluation planning for outpatient follow-up continue PPI, antibiotics discontinued no evidence of infection.Fluid management done with HD.At this time euvolemic hemoglobin stable 8.2 g.Plan for dialysis home then home

## 2022-02-15 NOTE — Consult Note (Signed)
Consultation  Referring Provider:     Vernelle Emerald, MD Primary Care Physician:  [name], NP Primary Gastroenterologist:      Althia Forts Reason for Consultation:     Melena, acute blood loss anemia, abdominal pain         HPI:   Daniel [name] is a 61 y.o. male with a history of ESRD on HD MWF, diabetes, history of recurrent GI bleed (duodenal ulcer, small bowel AVMs, erosive esophagitis, esophageal varices), EtOH use disorder, HCV cirrhosis, HCC, anemia of chronic disease, presenting with abdominal pain, distention, and melena x1 day with DOE.  Admission hemoglobin 7.6 --> 6.6.  Transfusing PRBCs now.  He states melena started yesterday.  No nausea/vomiting.  Has been having generalized abdominal pain and distention over the last couple of days, but looking through chart he was also seen in the ER on 01/31/2022 for abdominal pain.  Most recent hospital admission for similar issues was 11/2021 with FOBT positive stool and anemia.  No repeat endoscopy.  Octreotide LAR denied.  Recommended outpatient referral to Dr. Salome Holmes at [facility] for double-balloon enteroscopy for suspected AVMs out of reach of local capabilities  Was last seen by Dr. Annamaria Boots in Oncology on 02/06/2022 for follow-up of [place] and anemia.  Treated with Y90 on 11/28/2021.  No plan for systemic therapy.  Hemoglobin 8.4 and was treated with IV Feraheme  Most recent CT A/P in ER on 01/31/2022 (indication: Abdominal pain, diarrhea) showed stable 4.2 cm central liver mass, moderate ascites and mesenteric edema without significant change from previous.  GI PCR panel negative, C. difficile negative  Labs on presentation today notable for the following: - H/H 7.6/23.4 --> 6.6/19.8 --> 6.3/19 (baseline Hgb 8.5-9) - NA 131, BUN/creatinine 38/4.9 - Albumin 1.9, AST/ALT 52/17, T. bili 1.4  - INR 1.2    Chart reviewed.  Has had multiple hospital admissions for GI bleed with an extensive evaluation in the past to include  most recent following:  - 06/2019 colonoscopy: sigmoid diverticulosis, possible old blood in terminal ileum suggesting SB source. -01/2021 EGD: Barrett's esophagus  -07/05/2021 SBE.  Friable gastric mucosa, normal esophagus.  A few, recently bleeding AVMs in duodenum treated with monopolar probe.  Single nonbleeding jejunal AVM treated with probe -07/05/2021 flex sig with blood in rectum, rectosigmoid and sigmoid. -08/06/21 SBE: Severe, circumferential esophagitis without bleeding.  Pill esophagitis suspected.  Friable gastric mucosa/gastritis.  Multiple friable duodenal polyps bled on contact and possible source of GI bleed.  Examined jejunum normal.  Pathology showed Candida esophagitis with erosion.  Gastric polyps were hyperplastic with focal erosion at surface, no H. pylori.  Duodenal polyps also hyperplastic, no features of celiac disease on duodenal biopsies. -08/24/2021 SBE.  Distal extent tattooed.  Grade 1 esophageal varices.  Portal hypertensive gastropathy.  Duodenal polyps. -08/25/2021 VCE.  Duodenal polyps.  Small red spot vs diminutive AVM without blood anywhere.  Green liquid secretions and stool throughout the bowel.  Study considered nondiagnostic -09/15/2021 SBE.  3 duodenal AVMs treated with APC and felt to be likely source of bleeding.  Small esophageal varices, portal hypertensive gastropathy and solitary gastric polyp. -10/06/2021 VCE, capsule placed using endoscope.  Capsule moved back into the stomach then into the duodenum at about 6 hours and 20-minute 9 minutes then back into stomach where battery life of 12 hours expired. Visualized previously cauterized sites of pre-pyloric/duodenal polyps, duodenal AVM w heme in region.  Dr. Carlean Purl chose not to pursue additional endoscopy.  Felt  pt would need deep enteroscopy which may require tertiary care facility.  MD said "afraid he will have recurrent problems regardless" -08/05/2021 CT angio abdomen pelvis.  No active bleeding.  Outpouching  near expected position of duodenal bulb or post bulbar duodenum, suspicious for focal duodenal ulcer.  Adjacent retroperitoneal fat edema.  4.4 cm mass near juncture of liver segments IVb and VIII as well as a possible adjacent 2.2 cm mass in segment VIII.  Differential includes hepatocellular carcinoma, neuroendocrine carcinoma.  Not excluded for cholangiocarcinoma and metastatic disease. -08/07/21 MRI abd, MRCP: Cirrhosis.  5 cm mass at segment 4A is LI-RADS 5, c/w hepatocellular carcinoma.  2 cm mass in the central right hepatic lobe LI-RADS 3. Consider follow-up in 3-6 months -10/09/2021: CT chest/abd/pelvis: Extensive multifocal pulmonary consolidation, infiltrate progressive compared with previous, bilateral pleural effusions.  Stable mild peripancreatic edema in pancreaticoduodenal groove, Querry mild edema/interstitial pancreatitis.  Central hepatic masses not well delineated on noncontrast study.  Cirrhosis.  Cholelithiasis. Nonobstructing left nephrolithiasis -AFP 13.9 in mid April 2023, 10.5 in early May 2023 -11/14/2021 SBE. For Hgb 6.9, platelets 108, black stools:  Grade 1, nonbleeding esophageal varices, no stigmata of bleeding.  Nodular bulbar mucosa.  Tattoo seen at jejunum.  No blood, no bleeding, no obvious source of bleeding.  Dr. Loletha Carrow suspected distal small bowel is beyond the reach of enteroscope.  All offered idea of long-acting octreotide therapy, IV iron and transfusion prn.   Past Medical History:  Diagnosis Date   Anemia    Diabetes mellitus without complication [facility])    patient denies   Dialysis patient [facility])    End stage chronic kidney disease ([place])    Hypertension    ICH (intracerebral hemorrhage) ([place]) 05/20/2017   PAD (peripheral artery disease) ([facility])    Shoulder pain, left 06/28/2013    Past Surgical History:  Procedure Laterality Date   A/V FISTULAGRAM N/A 08/15/2020   Procedure: A/V FISTULAGRAM - Left Upper;  Surgeon: Cherre Robins, MD;  Location: [place]  CV LAB;  Service: Cardiovascular;  Laterality: N/A;   ABDOMINAL AORTOGRAM W/LOWER EXTREMITY N/A 04/08/2021   Procedure: ABDOMINAL AORTOGRAM W/LOWER EXTREMITY;  Surgeon: Waynetta Sandy, MD;  Location: [place] CV LAB;  Service: Cardiovascular;  Laterality: N/A;   AMPUTATION Left 04/16/2021   Procedure: LEFT BELOW KNEE AMPUTATION;  Surgeon: Angelia Mould, MD;  Location: [place];  Service: Vascular;  Laterality: Left;   AMPUTATION Left 06/17/2021   Procedure: REVISION AMPUTATION BELOW KNEE;  Surgeon: Broadus John, MD;  Location: [place];  Service: Vascular;  Laterality: Left;   APPENDECTOMY     APPLICATION OF WOUND VAC Left 06/17/2021   Procedure: APPLICATION OF WOUND VAC;  Surgeon: Broadus John, MD;  Location: [place];  Service: Vascular;  Laterality: Left;   AV FISTULA PLACEMENT Left 08/03/2019   Procedure: LEFT ARM ARTERIOVENOUS (AV) CEPHALIC  FISTULA CREATION;  Surgeon: Waynetta Sandy, MD;  Location: [place];  Service: Vascular;  Laterality: Left;   BIOPSY  06/30/2019   Procedure: BIOPSY;  Surgeon: Ronald Lobo, MD;  Location: [facility] ENDOSCOPY;  Service: Endoscopy;;   BIOPSY  08/02/2019   Procedure: BIOPSY;  Surgeon: Yetta Flock, MD;  Location: [facility] ENDOSCOPY;  Service: Gastroenterology;;   BIOPSY  02/08/2021   Procedure: BIOPSY;  Surgeon: Sharyn Creamer, MD;  Location: [facility] ENDOSCOPY;  Service: Gastroenterology;;   BIOPSY  02/24/2021   Procedure: BIOPSY;  Surgeon: [name], MD;  Location: [place];  Service: Gastroenterology;;  BIOPSY  03/26/2021   Procedure: BIOPSY;  Surgeon: Lavena Bullion, DO;  Location: [place];  Service: Gastroenterology;;   BIOPSY  08/06/2021   Procedure: BIOPSY;  Surgeon: [name], MD;  Location: [place];  Service: Gastroenterology;;   BIOPSY  09/15/2021   Procedure: BIOPSY;  Surgeon: Sharyn Creamer, MD;  Location: [place];  Service: Gastroenterology;;   BRONCHIAL BIOPSY  12/26/2021    Procedure: BRONCHIAL BIOPSIES;  Surgeon: Juanito Doom, MD;  Location: [place];  Service: Cardiopulmonary;;   BRONCHIAL WASHINGS  12/26/2021   Procedure: BRONCHIAL WASHINGS;  Surgeon: Juanito Doom, MD;  Location: [place];  Service: Cardiopulmonary;;   COLONOSCOPY  01/23/2012   Procedure: COLONOSCOPY;  Surgeon: Danie Binder, MD;  [facility];  Service: Endoscopy;  Laterality: N/A;  11:10 AM   COLONOSCOPY WITH PROPOFOL N/A 06/30/2019   Procedure: COLONOSCOPY WITH PROPOFOL;  Surgeon: Ronald Lobo, MD;  Location: [place];  Service: Endoscopy;  Laterality: N/A;   ENTEROSCOPY N/A 08/02/2019   Procedure: ENTEROSCOPY;  Surgeon: Yetta Flock, MD;  Location: [facility] ENDOSCOPY;  Service: Gastroenterology;  Laterality: N/A;   ENTEROSCOPY N/A 02/24/2021   Procedure: ENTEROSCOPY;  Surgeon: [name], MD;  Location: [facility] ENDOSCOPY;  Service: Gastroenterology;  Laterality: N/A;   ENTEROSCOPY N/A 03/26/2021   Procedure: ENTEROSCOPY;  Surgeon: Lavena Bullion, DO;  Location: [place];  Service: Gastroenterology;  Laterality: N/A;   ENTEROSCOPY N/A 07/05/2021   Procedure: ENTEROSCOPY;  Surgeon: Carol Ada, MD;  Location: [place];  Service: Gastroenterology;  Laterality: N/A;   ENTEROSCOPY N/A 08/06/2021   Procedure: ENTEROSCOPY;  Surgeon: [name], MD;  Location: [facility] ENDOSCOPY;  Service: Gastroenterology;  Laterality: N/A;   ENTEROSCOPY N/A 08/24/2021   Procedure: ENTEROSCOPY;  Surgeon: Ladene Artist, MD;  Location: [facility] ENDOSCOPY;  Service: Gastroenterology;  Laterality: N/A;   ENTEROSCOPY N/A 09/15/2021   Procedure: ENTEROSCOPY;  Surgeon: Sharyn Creamer, MD;  Location: [facility] ENDOSCOPY;  Service: Gastroenterology;  Laterality: N/A;   ENTEROSCOPY N/A 10/06/2021   Procedure: ENTEROSCOPY;  Surgeon: [name], MD;  Location: [facility] ENDOSCOPY;  Service: Gastroenterology;  Laterality: N/A;   ENTEROSCOPY N/A 11/14/2021   Procedure: ENTEROSCOPY;   Surgeon: Doran Stabler, MD;  Location: [facility] ENDOSCOPY;  Service: Gastroenterology;  Laterality: N/A;   ESOPHAGOGASTRODUODENOSCOPY N/A 08/10/2020   Procedure: ESOPHAGOGASTRODUODENOSCOPY (EGD);  Surgeon: Jerene Bears, MD;  Location: [facility] ENDOSCOPY;  Service: Gastroenterology;  Laterality: N/A;   ESOPHAGOGASTRODUODENOSCOPY (EGD) WITH PROPOFOL N/A 06/30/2019   Procedure: ESOPHAGOGASTRODUODENOSCOPY (EGD) WITH PROPOFOL;  Surgeon: Ronald Lobo, MD;  Location: [place];  Service: Endoscopy;  Laterality: N/A;   ESOPHAGOGASTRODUODENOSCOPY (EGD) WITH PROPOFOL N/A 01/12/2021   Procedure: ESOPHAGOGASTRODUODENOSCOPY (EGD) WITH PROPOFOL;  Surgeon: Sharyn Creamer, MD;  Location: [place];  Service: Gastroenterology;  Laterality: N/A;   ESOPHAGOGASTRODUODENOSCOPY (EGD) WITH PROPOFOL N/A 02/08/2021   Procedure: ESOPHAGOGASTRODUODENOSCOPY (EGD) WITH PROPOFOL;  Surgeon: Sharyn Creamer, MD;  Location: [place];  Service: Gastroenterology;  Laterality: N/A;   FISTULA SUPERFICIALIZATION Left 10/17/2019   Procedure: LEFT UPPER EXTREMITY FISTULA REVISION, SIDE BRANCH LIGATION,  AND SUPERFICIALIZATION;  Surgeon: Marty Heck, MD;  Location: [place];  Service: Vascular;  Laterality: Left;   FISTULA SUPERFICIALIZATION Left 19/41/7408   Procedure: PLICATION OF ANEURYSM LEFT ARTERIOVENOUS FISTULA;  Surgeon: Angelia Mould, MD;  Location: [place];  Service: Vascular;  Laterality: Left;   FLEXIBLE SIGMOIDOSCOPY N/A 07/05/2021   Procedure: FLEXIBLE SIGMOIDOSCOPY;  Surgeon: Carol Ada, MD;  Location: [facility]  ENDOSCOPY;  Service: Gastroenterology;  Laterality: N/A;   GIVENS CAPSULE STUDY N/A 06/30/2019   Procedure: GIVENS CAPSULE STUDY;  Surgeon: Ronald Lobo, MD;  Location: [place];  Service: Endoscopy;  Laterality: N/A;   GIVENS CAPSULE STUDY N/A 08/25/2021   Procedure: GIVENS CAPSULE STUDY;  Surgeon: Ladene Artist, MD;  Location: [place];  Service: Gastroenterology;  Laterality: N/A;   GIVENS  CAPSULE STUDY N/A 10/06/2021   Procedure: GIVENS CAPSULE STUDY;  Surgeon: [name], MD;  Location: [place];  Service: Gastroenterology;  Laterality: N/A;   HEMOSTASIS CLIP PLACEMENT  08/10/2020   Procedure: HEMOSTASIS CLIP PLACEMENT;  Surgeon: Jerene Bears, MD;  Location: [place];  Service: Gastroenterology;;   HEMOSTASIS CLIP PLACEMENT  01/12/2021   Procedure: HEMOSTASIS CLIP PLACEMENT;  Surgeon: Sharyn Creamer, MD;  Location: [place];  Service: Gastroenterology;;   HEMOSTASIS CONTROL  08/02/2019   Procedure: HEMOSTASIS CONTROL;  Surgeon: Yetta Flock, MD;  Location: [place];  Service: Gastroenterology;;   HOT HEMOSTASIS  02/24/2021   Procedure: HOT HEMOSTASIS (ARGON PLASMA COAGULATION/BICAP);  Surgeon: [name], MD;  Location: [facility] ENDOSCOPY;  Service: Gastroenterology;;   HOT HEMOSTASIS N/A 03/26/2021   Procedure: HOT HEMOSTASIS (ARGON PLASMA COAGULATION/BICAP);  Surgeon: Lavena Bullion, DO;  Location: [facility] ENDOSCOPY;  Service: Gastroenterology;  Laterality: N/A;   HOT HEMOSTASIS N/A 07/05/2021   Procedure: HOT HEMOSTASIS (ARGON PLASMA COAGULATION/BICAP);  Surgeon: Carol Ada, MD;  Location: [place];  Service: Gastroenterology;  Laterality: N/A;   HOT HEMOSTASIS N/A 09/15/2021   Procedure: HOT HEMOSTASIS (ARGON PLASMA COAGULATION/BICAP);  Surgeon: Sharyn Creamer, MD;  Location: [place];  Service: Gastroenterology;  Laterality: N/A;   HOT HEMOSTASIS N/A 10/06/2021   Procedure: HOT HEMOSTASIS (ARGON PLASMA COAGULATION/BICAP);  Surgeon: [name], MD;  Location: [place];  Service: Gastroenterology;  Laterality: N/A;   INCISION AND DRAINAGE ABSCESS N/A 06/29/2016   Procedure: INCISION AND DRAINAGE ABDOMINAL WALL ABSCESS;  Surgeon: Alphonsa Overall, MD;  Location: [facility];  Service: General;  Laterality: N/A;   INSERTION OF DIALYSIS CATHETER Right 08/03/2019   Procedure: INSERTION OF DIALYSIS CATHETER;  Surgeon: Waynetta Sandy, MD;  Location: [place];  Service: Vascular;  Laterality: Right;   INSERTION OF DIALYSIS CATHETER Right 10/22/2019   Procedure: INSERTION OF 23CM TUNNELED DIALYSIS CATHETER RIGHT INTERNAL JUGULAR;  Surgeon: Angelia Mould, MD;  Location: [place];  Service: Vascular;  Laterality: Right;   INSERTION OF DIALYSIS CATHETER Right 08/12/2020   Procedure: INSERTION OF Right internal Jugular TUNNELED  DIALYSIS CATHETER.;  Surgeon: Waynetta Sandy, MD;  Location: [place];  Service: Vascular;  Laterality: Right;   INSERTION OF DIALYSIS CATHETER N/A 04/19/2021   Procedure: INSERTION OF TUNNELED DIALYSIS CATHETER;  Surgeon: Angelia Mould, MD;  Location: [place];  Service: Vascular;  Laterality: N/A;   IR 3D INDEPENDENT WKST  11/07/2021   IR ANGIOGRAM SELECTIVE EACH ADDITIONAL VESSEL  11/07/2021   IR ANGIOGRAM SELECTIVE EACH ADDITIONAL VESSEL  11/07/2021   IR ANGIOGRAM SELECTIVE EACH ADDITIONAL VESSEL  11/07/2021   IR ANGIOGRAM SELECTIVE EACH ADDITIONAL VESSEL  11/28/2021   IR ANGIOGRAM VISCERAL SELECTIVE  11/07/2021   IR ANGIOGRAM VISCERAL SELECTIVE  11/28/2021   IR EMBO ARTERIAL NOT HEMORR HEMANG INC GUIDE ROADMAPPING  11/07/2021   IR EMBO TUMOR ORGAN ISCHEMIA INFARCT INC GUIDE ROADMAPPING  11/28/2021   IR RADIOLOGIST EVAL & MGMT  09/12/2021   IR US GUIDE VASC ACCESS LEFT  11/07/2021   IR US GUIDE VASC ACCESS  LEFT  11/28/2021   Left heel surgery     LOWER EXTREMITY ANGIOGRAPHY N/A 07/08/2021   Procedure: Lower Extremity Angiography;  Surgeon: Cherre Robins, MD;  Location: [place] CV [facility];  Service: Cardiovascular;  Laterality: N/A;   PENILE BIOPSY N/A 03/26/2020   Procedure: PENILE ULCER DEBRIDEMENT;  Surgeon: Remi Haggard, MD;  Location: [facility];  Service: Urology;  Laterality: N/A;  30 MINS   PERIPHERAL VASCULAR INTERVENTION Right 07/08/2021   Procedure: PERIPHERAL VASCULAR INTERVENTION;  Surgeon: Cherre Robins, MD;  Location: [place] [facility];  Service: Cardiovascular;   Laterality: Right;   SCLEROTHERAPY  01/12/2021   Procedure: SCLEROTHERAPY;  Surgeon: Sharyn Creamer, MD;  Location: [facility] ENDOSCOPY;  Service: Gastroenterology;;   Clide Deutscher  02/24/2021   Procedure: Clide Deutscher;  Surgeon: [name], MD;  Location: [place];  Service: Gastroenterology;;   SUBMUCOSAL TATTOO INJECTION  08/24/2021   Procedure: SUBMUCOSAL TATTOO INJECTION;  Surgeon: Ladene Artist, MD;  Location: [place];  Service: Gastroenterology;;   SUBMUCOSAL TATTOO INJECTION  09/15/2021   Procedure: SUBMUCOSAL TATTOO INJECTION;  Surgeon: Sharyn Creamer, MD;  Location: [place];  Service: Gastroenterology;;   VIDEO BRONCHOSCOPY N/A 12/26/2021   Procedure: VIDEO BRONCHOSCOPY WITH FLUORO;  Surgeon: Juanito Doom, MD;  Location: [place];  Service: Cardiopulmonary;  Laterality: N/A;    Family History  Problem Relation Age of Onset   Colon cancer Neg Hx      Social History   Tobacco Use   Smoking status: Every Day    Packs/day: 0.50    Years: 45.00    Total pack years: 22.50    Types: Cigarettes   Smokeless tobacco: Never   Tobacco comments:    Pt left before information given  Vaping Use   Vaping Use: Never used  Substance Use Topics   Alcohol use: Not Currently   Drug use: Not Currently    Types: "Crack" cocaine    Comment: last in 2020    Prior to Admission medications   Medication Sig Start Date End Date Taking? Authorizing Provider  amLODipine (NORVASC) 5 MG tablet Take 1 tablet (5 mg total) by mouth daily. 02/10/22 05/11/22  [name], NP  atorvastatin (LIPITOR) 40 MG tablet Take 1 tablet (40 mg total) by mouth daily. 09/18/21 12/26/22  [place] Donnamarie Poag, DO  folic acid (FOLVITE) 1 MG tablet Take 1 tablet (1 mg total) by mouth daily. 12/12/21   Eugenie Filler, MD  gabapentin (NEURONTIN) 300 MG capsule Take 300 mg by mouth 3 (three) times daily. 02/07/22   [provider]  loperamide (IMODIUM) 2 MG capsule Take 1 capsule (2  mg total) by mouth 4 (four) times daily as needed for diarrhea or loose stools. 01/31/22   Carmin Muskrat, MD  Multiple Vitamin (MULTIVITAMIN WITH MINERALS) TABS tablet Take 1 tablet by mouth daily. 12/12/21   Eugenie Filler, MD  pantoprazole (PROTONIX) 40 MG tablet Take 1 tablet (40 mg total) by mouth 2 (two) times daily before a meal. 10/10/21 04/05/22  Mercy Riding, MD  thiamine (VITAMIN B1) 100 MG tablet Take 1 tablet (100 mg total) by mouth daily. 12/12/21   Eugenie Filler, MD  traZODone (DESYREL) 50 MG tablet Take 1 tablet (50 mg total) by mouth daily as needed for sleep. Patient taking differently: Take 50 mg by mouth at bedtime as needed for sleep. 02/10/22   [name], NP  colchicine 0.6 MG tablet Take 0.5 tablets (0.3 mg  total) by mouth 2 (two) times daily. 07/24/20 07/29/20  Noemi Chapel, MD  furosemide (LASIX) 40 MG tablet Take 1 tablet (40 mg total) by mouth daily. 07/25/19 02/09/20  Ladona Horns, MD    Current Facility-Administered Medications  Medication Dose Route Frequency Provider Last Rate Last Admin   0.9 %  sodium chloride infusion (Manually program via Guardrails IV Fluids)   Intravenous Once Kayleen Memos, DO       acetaminophen (TYLENOL) tablet 650 mg  650 mg Oral Q6H PRN Irene Pap N, DO       cefTRIAXone (ROCEPHIN) 2 g in sodium chloride 0.9 % 100 mL IVPB  2 g Intravenous Q24H Irene Pap N, DO   Stopped at 02/15/22 7564   furosemide (LASIX) 120 mg in dextrose 5 % 50 mL IVPB  120 mg Intravenous Once Vernelle Emerald, MD 62 mL/hr at 02/15/22 0941 120 mg at 02/15/22 0941   HYDROmorphone (DILAUDID) injection 0.5 mg  0.5 mg Intravenous Q4H PRN Irene Pap N, DO   0.5 mg at 02/15/22 0247   melatonin tablet 5 mg  5 mg Oral QHS PRN Irene Pap N, DO       oxyCODONE (Oxy IR/ROXICODONE) immediate release tablet 5 mg  5 mg Oral Q6H PRN Hall, Carole N, DO       pantoprozole (PROTONIX) 80 mg /NS 100 mL infusion  8 mg/hr Intravenous Continuous Cardama, Grayce Sessions, MD 10 mL/hr at 02/15/22 0154 8 mg/hr at 02/15/22 0154   polyethylene glycol (MIRALAX / GLYCOLAX) packet 17 g  17 g Oral Daily PRN Irene Pap N, DO       prochlorperazine (COMPAZINE) injection 5 mg  5 mg Intravenous Q6H PRN Hall, Carole N, DO       senna-docusate (Senokot-S) tablet 1 tablet  1 tablet Oral QHS Kayleen Memos, DO       Current Outpatient Medications  Medication Sig Dispense Refill   amLODipine (NORVASC) 5 MG tablet Take 1 tablet (5 mg total) by mouth daily. 30 tablet 2   atorvastatin (LIPITOR) 40 MG tablet Take 1 tablet (40 mg total) by mouth daily. 30 tablet 2   folic acid (FOLVITE) 1 MG tablet Take 1 tablet (1 mg total) by mouth daily.     gabapentin (NEURONTIN) 300 MG capsule Take 300 mg by mouth 3 (three) times daily.     loperamide (IMODIUM) 2 MG capsule Take 1 capsule (2 mg total) by mouth 4 (four) times daily as needed for diarrhea or loose stools. 24 capsule 0   Multiple Vitamin (MULTIVITAMIN WITH MINERALS) TABS tablet Take 1 tablet by mouth daily.     pantoprazole (PROTONIX) 40 MG tablet Take 1 tablet (40 mg total) by mouth 2 (two) times daily before a meal. 60 tablet 2   thiamine (VITAMIN B1) 100 MG tablet Take 1 tablet (100 mg total) by mouth daily.     traZODone (DESYREL) 50 MG tablet Take 1 tablet (50 mg total) by mouth daily as needed for sleep. (Patient taking differently: Take 50 mg by mouth at bedtime as needed for sleep.) 90 tablet 0    Allergies as of 02/14/2022 - Review Complete 02/14/2022  Allergen Reaction Noted   Seroquel [quetiapine] Other (See Comments) 04/22/2021   Dilaudid [hydromorphone hcl] Itching and Other (See Comments) 07/29/2020     Review of Systems:    As per HPI, otherwise negative    Physical Exam:  Vital signs in last 24 hours: Temp:  [98 F (36.7 C)-99.7  F (37.6 C)] 98.2 F (36.8 C) (10/21 0900) Pulse Rate:  [94-106] 94 (10/21 0900) Resp:  [16-20] 20 (10/21 0900) BP: (101-159)/(49-99) 141/95 (10/21 0900) SpO2:   [93 %-97 %] 97 % (10/21 0900) Weight:  [108.7 kg] 108.7 kg (10/20 2039)   General:   Pleasant male in NAD Head:  Normocephalic and atraumatic. Lungs:  Respirations even and unlabored. Lungs clear to auscultation bilaterally.   No wheezes, crackles, or rhonchi.  Heart:  Regular rate and rhythm; no MRG Abdomen:  Distended, mild TTP in upper abdomen, +fluid wave.  Normal bowel sounds.  Neurologic:  Alert and  oriented x4;  grossly normal neurologically. Psych:  Alert and cooperative. Normal affect.  LAB RESULTS: Recent Labs    02/14/22 2100 02/15/22 0235 02/15/22 0409  WBC 3.9*  --  3.9*  HGB 7.6* 6.6* 6.3*  HCT 22.4* 19.8* 19.0*  PLT 101*  --  94*   BMET Recent Labs    02/14/22 2100 02/15/22 0409  NA 129* 131*  K 3.3* 3.5  CL 93* 97*  CO2 27 25  GLUCOSE 96 99  BUN 26* 38*  CREATININE 4.81* 4.91*  CALCIUM 7.5* 7.4*   LFT Recent Labs    02/15/22 0409  PROT 6.8  ALBUMIN 1.9*  AST 52*  ALT 17  ALKPHOS 94  BILITOT 1.4*   PT/INR Recent Labs    02/14/22 2100  LABPROT 15.4*  INR 1.2    STUDIES: DG Chest Port 1 View  Result Date: 02/15/2022 CLINICAL DATA:  Shortness of breath. EXAM: PORTABLE CHEST 1 VIEW COMPARISON:  01/24/2022 FINDINGS: Cardiomegaly again identified. Pulmonary vascular congestion again noted. Increasing bilateral interstitial and airspace opacities within the mid and lower lungs noted, question edema versus infection. A small RIGHT pleural effusion is again noted. There is no evidence of pneumothorax. IMPRESSION: 1. Increasing bilateral interstitial and airspace opacities, question edema versus infection. 2. Unchanged small RIGHT pleural effusion. Electronically Signed   By: Margarette Canada M.D.   On: 02/15/2022 09:07       Impression / Plan:   1) Acute on chronic anemia 2) Melena 3) History of small bowel AVMs 4) Symptomatic anemia He has had an extensive evaluation in the past for recurrent GI bleeds, to include multiple endoscopies this year  (several VCE, small bowel endoscopy, EGD, etc.), notable for small bowel AVMs.  Tried treating with long-acting octreotide, but this was ultimately not approved by his insurance as outpatient use.  Tried referring over to [facility] for double-balloon enteroscopy in the past as well.  Ultimately, based on extensive evaluation today, significant comorbidities, and likelihood for recurrence despite endoscopic intervention, I think the better part of valor would be medical management rather than repeat endoscopic therapy at this juncture. - Transfusing PRBCs now - Anticipate transfer to [place] [facility] [facility] due to need for [facility] - Continue serial CBC checks - Continue PPI - Clears okay  5) Abdominal pain 6) Cirrhosis 7) HCC - Stat CT A/P now - If CT without acute intra-abdominal pathology but persistent ascites, plan for IR consult with paracentesis for therapeutic intent and rule out SBP - Will need to follow-up in the Hematology clinic - Clinical description does not seem consistent with variceal bleed that would require emergent endoscopy - Agree with ABX for GI bleed in setting of cirrhosis/ascites  8) ESRD on HD - HD per Nephrology   Gerrit Heck, DO, [facility]    LOS: 0 days   Lavena Bullion  02/15/2022, 10:10 AM

## 2022-02-15 NOTE — ED Notes (Addendum)
Sent another secure chat to nurse on 5W receiving patient confirm she has no further questions after reviewing sbar and that carelink has been called. Pt was still on lunch per staff, made staff aware to let her know I will be calling for carelink

## 2022-02-15 NOTE — Assessment & Plan Note (Signed)
·   Please see assessment and plan above °

## 2022-02-15 NOTE — Assessment & Plan Note (Signed)
.   Continuing home regimen of lipid lowering therapy.  

## 2022-02-15 NOTE — Progress Notes (Signed)
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, S/P Y90 ablation 11/28/2021), ongoing alcohol abuse, iron deficiency anemia, ESRD (HD MWF), history of subarachnoid hemorrhage (2019), peripheral vascular disease (S/P right SFA stent), status post left BKA, diastolic congestive heart failure, pulmonary hypertension, nicotine dependence (cigarettes), gastroesophageal reflux disease who presented to [facility] emergency department with complaints of abdominal pain and dark stools.  Upon evaluation the patient was felt to be suffering from a gastrointestinal bleed considering patient's complicated history of GI bleeding in the past.  Patient was found to have a hemoglobin of 6.6 with several additional episodes of melena noted in the emergency department.  2 unit packed red blood cell transfusion was ordered and orders were placed for the patient to be admitted to [facility].   Assessment and Plan: * Acute upper GI bleed Patient presenting with complaints of melena and hemoglobin of 6.3, down from hemoglobin of 9.1 two weeks ago Patient has a known history of duodenal AVMs, hepatitis C cirrhosis and grade 1 esophageal varices Patient is already undergone an extensive evaluation in the past including multiple endoscopies in this year alone Case discussed with Dr. Bryan Lemma with gastroenterology, he has evaluated the patient and his input is appreciated Clear liquid diet okay for now Agrees with intravenous proton pump inhibitor Slow rate of bleeding and therefore octreotide was never initiated Ceftriaxone for SBP  prophylaxis will be continued Monitoring hemoglobin and hematocrit with serial CBCs 1 unit packed red blood cell transfusion being provided today  Acute on chronic diastolic CHF (congestive heart failure) ([place]) Patient has developed worsening shortness of breath since arrival initiation of intravenous fluids and blood transfusion This is likely leading to some degree of volume overload secondary to known history of end-stage renal disease as well as diastolic congestive heart failure Chest x-ray consistent with acute cardiogenic pulmonary edema personally reviewed Patient states that he still makes urine several times daily and therefore we will give the patient a one-time dose of 120 mg of intravenous Lasix If patient clinically deteriorates further will initiate BiPAP therapy Patient will ultimately need dialysis upon arrival to [facility], possibly urgently depending on clinical course  Acute cardiogenic pulmonary edema ([place]) Please see assessment and plan above  ESRD (end stage renal disease) on dialysis [facility]) Patient receives dialysis Monday Wednesday and Friday nephrology will need to be consulted upon arrival to [facility], possibly for urgent dialysis depending on patient's clinical course after blood transfusion and intravenous Lasix administration  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        Subjective:  Patient complaining of abdominal pain, located in the epigastric region, radiating  in a bandlike distribution around the abdomen.  Pain is moderate in intensity.  Pain explains that this pain is chronic.  Physical Exam:  Vitals:   02/15/22 1550 02/15/22 1838 02/15/22 1840 02/15/22 1841  BP: (!) 146/89  (!) 156/93   Pulse: 82  89   Resp: 20 17    Temp: 98.2 F (36.8 C)  98 F (36.7 C)   TempSrc:   Oral   SpO2: 97%   95%  Weight:      Height:        Constitutional: Awake alert and oriented x3, is in mild respiratory distress. Skin: no rashes, no lesions, good skin turgor noted. Eyes: Pupils are equally reactive to light.  No evidence of scleral icterus or conjunctival pallor.  ENMT: Moist mucous membranes noted.  Posterior pharynx clear of any exudate or lesions.   Respiratory: Notable rales in the bilateral mid and lower fields.  No evidence of wheezing.  Normal respiratory effort. No accessory muscle use.  Cardiovascular: Regular rate and rhythm, no murmurs / rubs / gallops. No extremity edema. 2+ pedal pulses. No carotid bruits.  Abdomen: No rubble diffuse tenderness of the abdomen, worst in the epigastric region.  Abdomen is somewhat protuberant but soft.  No evidence of intra-abdominal masses.  Positive bowel sounds noted in all quadrants.   Musculoskeletal: No joint deformity upper and lower extremities. Good ROM, no contractures. Normal muscle tone.    Data Reviewed:  I have personally reviewed and interpreted labs, imaging.  Significant findings are   CBC: Recent Labs  Lab 02/11/22 2321 02/14/22 2100 02/15/22 0235 02/15/22 0409 02/15/22 1552  WBC 3.8* 3.9*  --  3.9*  --   NEUTROABS  --  2.6  --  2.0  --   HGB 8.0* 7.6* 6.6* 6.3* 8.4*  HCT 24.2* 22.4* 19.8* 19.0* 25.7*  MCV 100.4* 98.2  --  100.5*  --   PLT 94* 101*  --  94*  --    Basic Metabolic Panel: Recent Labs  Lab 02/11/22 2321 02/14/22 2100 02/15/22 0409  NA 126* 129* 131*  K 3.9 3.3* 3.5  CL 90* 93* 97*  CO2 '24 27 25  '$ GLUCOSE 86 96 99  BUN 26* 26* 38*  CREATININE 6.99*  4.81* 4.91*  CALCIUM 8.0* 7.5* 7.4*  MG  --   --  1.8  PHOS  --   --  3.3   GFR: Estimated Creatinine Clearance: 19.6 mL/min (A) (by C-G formula based on SCr of 4.91 mg/dL (H)). Liver Function Tests: Recent Labs  Lab 02/11/22 2321 02/14/22 2100 02/15/22 0409  AST 51* 53* 52*  ALT '18 17 17  '$ ALKPHOS 118 110 94  BILITOT 1.2 1.4* 1.4*  PROT 7.8 7.8 6.8  ALBUMIN 2.0* 2.1* 1.9*    Coagulation Profile: Recent Labs  Lab 02/14/22 2100  INR 1.2      Code Status:  Full code  code status decision has been confirmed with: patient Family Communication: deferred    Severity of Illness:  The appropriate patient status for this patient is INPATIENT. Inpatient status is judged to be reasonable and necessary in order to provide the required intensity of service to ensure the patient's safety. The patient's presenting symptoms, physical exam findings, and initial radiographic and laboratory data in the context of their chronic comorbidities is felt to place them at high risk for further clinical deterioration. Furthermore, it is not anticipated that the patient will be medically stable for discharge from the hospital within 2 midnights of admission.   * I certify that at the point of admission it is my clinical judgment that the patient will require  inpatient hospital care spanning beyond 2 midnights from the point of admission due to high intensity of service, high risk for further deterioration and high frequency of surveillance required.*  Time spent:  60 minutes  Author:  Vernelle Emerald MD  02/15/2022 7:38 PM

## 2022-02-15 NOTE — ED Notes (Signed)
Blood transfusion rate decreased

## 2022-02-15 NOTE — ED Notes (Signed)
[facility] called for transport. 

## 2022-02-15 NOTE — Assessment & Plan Note (Signed)
   Patient has developed worsening shortness of breath since arrival initiation of intravenous fluids and blood transfusion  This is likely leading to some degree of volume overload secondary to known history of end-stage renal disease as well as diastolic congestive heart failure  Chest x-ray consistent with acute cardiogenic pulmonary edema personally reviewed  Patient states that he still makes urine several times daily and therefore we will give the patient a one-time dose of 120 mg of intravenous Lasix  If patient clinically deteriorates further will initiate BiPAP therapy  Patient will ultimately need dialysis upon arrival to [facility], possibly urgently depending on clinical course

## 2022-02-15 NOTE — Progress Notes (Signed)
Eustace Pen, RN [phone] for report of pt, but RN unavailable. Provided direct number [phone] via Epic Chat to be reached when she is available.

## 2022-02-15 NOTE — ED Notes (Signed)
Called 5W. Nurse is on lunch and will review when she returns

## 2022-02-15 NOTE — ED Notes (Signed)
Blood bank has 2 units of blood ready for this patient.  Notified Ashley,RN.

## 2022-02-15 NOTE — Progress Notes (Signed)
Pt transported from stretcher to bed 3X assist. Pt A&OX4. Call bell and personal items within reach. Bed alarm in place and in the lowest position.

## 2022-02-15 NOTE — ED Notes (Signed)
Asked pt if he is allergic to dilaudid due to having allergy listed in chart. Patient states he don't recall getting IM injection with itching afterwards. Explained risk to patient of using medication if has a allergy. MD aware. Pt is requesting medicine

## 2022-02-15 NOTE — ED Notes (Signed)
I&O performed without difficulty with 288m out

## 2022-02-15 NOTE — Assessment & Plan Note (Signed)
   Patient presenting with complaints of melena and hemoglobin of 6.3, down from hemoglobin of 9.1 two weeks ago  Patient has a known history of duodenal AVMs, hepatitis C cirrhosis and grade 1 esophageal varices  Patient is already undergone an extensive evaluation in the past including multiple endoscopies in this year alone  Case discussed with Dr. Bryan Lemma with gastroenterology, he has evaluated the patient and his input is appreciated  Clear liquid diet okay for now  Agrees with intravenous proton pump inhibitor  Slow rate of bleeding and therefore octreotide was never initiated  Ceftriaxone for SBP prophylaxis will be continued  Monitoring hemoglobin and hematocrit with serial CBCs  1 unit packed red blood cell transfusion being provided today

## 2022-02-15 NOTE — ED Notes (Signed)
When this RN received patient at shift change malignance fluids was running at 157m/hr. Fluids stopped due to being dialysis patient and order for two units of blood. Blood started and now patient is complaining of feeling he cant breath and needs to sit at 90 degrees in bed. New cough noted. Patient talking in full sentences and vitals done. MD aware.

## 2022-02-16 ENCOUNTER — Inpatient Hospital Stay (HOSPITAL_COMMUNITY): Payer: Medicare Other

## 2022-02-16 DIAGNOSIS — N186 End stage renal disease: Secondary | ICD-10-CM | POA: Diagnosis not present

## 2022-02-16 DIAGNOSIS — K7469 Other cirrhosis of liver: Secondary | ICD-10-CM | POA: Diagnosis not present

## 2022-02-16 DIAGNOSIS — I5033 Acute on chronic diastolic (congestive) heart failure: Secondary | ICD-10-CM | POA: Diagnosis not present

## 2022-02-16 DIAGNOSIS — K922 Gastrointestinal hemorrhage, unspecified: Secondary | ICD-10-CM | POA: Diagnosis not present

## 2022-02-16 DIAGNOSIS — B1920 Unspecified viral hepatitis C without hepatic coma: Secondary | ICD-10-CM | POA: Diagnosis not present

## 2022-02-16 DIAGNOSIS — K921 Melena: Secondary | ICD-10-CM | POA: Diagnosis not present

## 2022-02-16 LAB — COMPREHENSIVE METABOLIC PANEL
ALT: 14 U/L (ref 0–44)
AST: 51 U/L — ABNORMAL HIGH (ref 15–41)
Albumin: 1.9 g/dL — ABNORMAL LOW (ref 3.5–5.0)
Alkaline Phosphatase: 90 U/L (ref 38–126)
Anion gap: 12 (ref 5–15)
BUN: 46 mg/dL — ABNORMAL HIGH (ref 8–23)
CO2: 26 mmol/L (ref 22–32)
Calcium: 8.5 mg/dL — ABNORMAL LOW (ref 8.9–10.3)
Chloride: 92 mmol/L — ABNORMAL LOW (ref 98–111)
Creatinine, Ser: 6.29 mg/dL — ABNORMAL HIGH (ref 0.61–1.24)
GFR, Estimated: 9 mL/min — ABNORMAL LOW (ref >60)
Glucose, Bld: 73 mg/dL (ref 70–99)
Potassium: 3.6 mmol/L (ref 3.5–5.1)
Sodium: 130 mmol/L — ABNORMAL LOW (ref 135–145)
Total Bilirubin: 1.5 mg/dL — ABNORMAL HIGH (ref 0.3–1.2)
Total Protein: 6.9 g/dL (ref 6.5–8.1)

## 2022-02-16 LAB — CBC WITH DIFFERENTIAL/PLATELET
Abs Immature Granulocytes: 0.02 10*3/uL (ref 0.00–0.07)
Basophils Absolute: 0 10*3/uL (ref 0.0–0.1)
Basophils Relative: 1 %
Eosinophils Absolute: 0 10*3/uL (ref 0.0–0.5)
Eosinophils Relative: 1 %
HCT: 21.2 % — ABNORMAL LOW (ref 39.0–52.0)
Hemoglobin: 7.4 g/dL — ABNORMAL LOW (ref 13.0–17.0)
Immature Granulocytes: 1 %
Lymphocytes Relative: 23 %
Lymphs Abs: 0.8 10*3/uL (ref 0.7–4.0)
MCH: 32.3 pg (ref 26.0–34.0)
MCHC: 34.9 g/dL (ref 30.0–36.0)
MCV: 92.6 fL (ref 80.0–100.0)
Monocytes Absolute: 0.6 10*3/uL (ref 0.1–1.0)
Monocytes Relative: 17 %
Neutro Abs: 1.9 10*3/uL (ref 1.7–7.7)
Neutrophils Relative %: 57 %
Platelets: 80 10*3/uL — ABNORMAL LOW (ref 150–400)
RBC: 2.29 MIL/uL — ABNORMAL LOW (ref 4.22–5.81)
RDW: 18.6 % — ABNORMAL HIGH (ref 11.5–15.5)
WBC: 3.3 10*3/uL — ABNORMAL LOW (ref 4.0–10.5)
nRBC: 0 % (ref 0.0–0.2)

## 2022-02-16 LAB — BODY FLUID CELL COUNT WITH DIFFERENTIAL
Eos, Fluid: 0 %
Lymphs, Fluid: 24 %
Monocyte-Macrophage-Serous Fluid: 71 % (ref 50–90)
Neutrophil Count, Fluid: 5 % (ref 0–25)
Total Nucleated Cell Count, Fluid: 513 cu mm (ref 0–1000)

## 2022-02-16 LAB — GRAM STAIN: Gram Stain: NONE SEEN

## 2022-02-16 LAB — HEPATITIS B SURFACE ANTIGEN: Hepatitis B Surface Ag: NONREACTIVE

## 2022-02-16 LAB — MAGNESIUM: Magnesium: 1.9 mg/dL (ref 1.7–2.4)

## 2022-02-16 MED ORDER — HEPARIN SODIUM (PORCINE) 1000 UNIT/ML DIALYSIS: 1000.0000 [IU] | INTRAMUSCULAR | Status: DC | PRN

## 2022-02-16 MED ORDER — PENTAFLUOROPROP-TETRAFLUOROETH EX AERO: 1.0000 | INHALATION_SPRAY | CUTANEOUS | Status: DC | PRN

## 2022-02-16 MED ORDER — ALTEPLASE 2 MG IJ SOLR: 2.0000 mg | Freq: Once | INTRAMUSCULAR | Status: DC | PRN

## 2022-02-16 MED ORDER — DIPHENHYDRAMINE HCL 25 MG PO CAPS
25.0000 mg | ORAL_CAPSULE | Freq: Once | ORAL | Status: AC | PRN
  Administered 2022-02-16: 25 mg via ORAL
  Filled 2022-02-16: qty 1

## 2022-02-16 MED ORDER — LIDOCAINE HCL (PF) 1 % IJ SOLN
INTRAMUSCULAR | Status: AC
  Filled 2022-02-16: qty 30

## 2022-02-16 MED ORDER — LIDOCAINE HCL (PF) 1 % IJ SOLN: 5.0000 mL | INTRAMUSCULAR | Status: DC | PRN

## 2022-02-16 MED ORDER — LIDOCAINE-PRILOCAINE 2.5-2.5 % EX CREA: 1.0000 | TOPICAL_CREAM | CUTANEOUS | Status: DC | PRN

## 2022-02-16 MED ORDER — CHLORHEXIDINE GLUCONATE CLOTH 2 % EX PADS
6.0000 | MEDICATED_PAD | Freq: Every day | CUTANEOUS | Status: DC
  Administered 2022-02-17: 6 via TOPICAL

## 2022-02-16 MED ORDER — LORAZEPAM 1 MG PO TABS: 1.0000 mg | ORAL_TABLET | Freq: Four times a day (QID) | ORAL | Status: DC | PRN

## 2022-02-16 MED ORDER — GUAIFENESIN-DM 100-10 MG/5ML PO SYRP
5.0000 mL | ORAL_SOLUTION | ORAL | Status: DC | PRN
  Administered 2022-02-16 – 2022-02-19 (×12): 5 mL via ORAL
  Filled 2022-02-16 (×11): qty 5

## 2022-02-16 NOTE — Progress Notes (Addendum)
[facility] Progress Note  CC:  Melena, acute blood loss anemia, abdominal pain  Subjective:  Feels ok.  Wants to eat.  No BM since he was transferred here from [facility].  Abdomen is sore.   Objective:  Vital signs in last 24 hours: Temp:  [97.4 F (36.3 C)-98.2 F (36.8 C)] 97.9 F (36.6 C) (10/22 0400) Pulse Rate:  [80-100] 84 (10/22 0902) Resp:  [16-20] 16 (10/22 0902) BP: (119-156)/(65-105) 150/92 (10/22 0906) SpO2:  [95 %-98 %] 96 % (10/22 0902)   General:  Alert, in NAD Heart:  Regular rate and rhythm; no murmurs Pulm:  CTAB.  No W/R/R. Abdomen:  Softly distended with ascites fluid.  BS present.  Left sided TTP. Extremities:  Left BKA. Neurologic:  Alert and oriented x 4; grossly normal neurologically. Psych:  Alert and cooperative. Normal mood and affect.  Intake/Output from previous day: 10/21 0701 - 10/22 0700 In: 778.3 [Blood:616.3; IV Piggyback:162] Out: -   Lab Results: Recent Labs    02/14/22 2100 02/15/22 0235 02/15/22 0409 02/15/22 1552 02/15/22 2029 02/16/22 0542  WBC 3.9*  --  3.9*  --   --  3.3*  HGB 7.6*   < > 6.3* 8.4* 8.5* 7.4*  HCT 22.4*   < > 19.0* 25.7* 24.1* 21.2*  PLT 101*  --  94*  --   --  80*   < > = values in this interval not displayed.   BMET Recent Labs    02/14/22 2100 02/15/22 0409 02/16/22 0542  NA 129* 131* 130*  K 3.3* 3.5 3.6  CL 93* 97* 92*  CO2 '27 25 26  '$ GLUCOSE 96 99 73  BUN 26* 38* 46*  CREATININE 4.81* 4.91* 6.29*  CALCIUM 7.5* 7.4* 8.5*   LFT Recent Labs    02/16/22 0542  PROT 6.9  ALBUMIN 1.9*  AST 51*  ALT 14  ALKPHOS 90  BILITOT 1.5*   PT/INR Recent Labs    02/14/22 2100  LABPROT 15.4*  INR 1.2   CT ABDOMEN PELVIS WO CONTRAST  Result Date: 02/15/2022 CLINICAL DATA:  Abdominal pain.  Melena. EXAM: CT ABDOMEN AND PELVIS WITHOUT CONTRAST TECHNIQUE: Multidetector CT imaging of the abdomen and pelvis was performed following the standard protocol without IV  contrast. RADIATION DOSE REDUCTION: This exam was performed according to the departmental dose-optimization program which includes automated exposure control, adjustment of the mA and/or kV according to patient size and/or use of iterative reconstruction technique. COMPARISON:  CT AP 01/31/22 FINDINGS: Lower chest: Redemonstrated moderate right and small left pleural effusion with associated bibasilar pulmonary opacities, unchanged compared to recent prior CT chest. Hepatobiliary: Redemonstrated nodular liver contour compatible with patient's history of cirrhosis. Redemonstrated hypodense lesion in the central liver, not significantly changed compared to prior exam. Gallbladder is decompressed with a small stone at the gallbladder neck. There is persistent moderate volume ascites, not significantly changed compared to prior exam. Pancreas: No pancreatic ductal dilatation or surrounding inflammatory changes. Spleen: Normal in size without focal abnormality. Adrenals/Urinary Tract: Bilateral glands are normal in size. Bilateral kidneys are small in size. There is evidence of left-sided nephrolithiasis without hydronephrosis. The urinary bladder is fluid-filled without focal wall thickening. Stomach/Bowel: The stomach, small bowel, large bowel are normal in caliber. No evidence of bowel obstruction. No evidence of pneumoperitoneum. No evidence of pneumatosis. Appendix is not definitively visualized. A candidate appendix along the right lateral margin of the cecum appears normal (series 2, image 64). Vascular/Lymphatic: There is  extensive vascular calcification of the abdominopelvic vasculature, as well as the bilateral outflow vessels. Reproductive: Prostate is normal in size. Other: No abdominal wall hernia or abnormality. Musculoskeletal: No acute or significant osseous findings. IMPRESSION: 1. No acute abdominal pathology this noncontrast enhanced exam. Redemonstrated findings of hepatic cirrhosis with moderate to  large volume ascites, unchanged compared to prior exam from 01/31/2022. Given history of melena, if there is concern for variceal bleed, further evaluation with a contrast-enhanced GI bleed protocol study is recommended. 2. Redemonstrated hypodense lesion in the central liver, not significantly unchanged compared to prior exam. 3. Redemonstrated moderate right and small left pleural effusions with associated bibasilar pulmonary opacities, unchanged compared to recent prior CT chest. 4. Cholelithiasis without evidence of cholecystitis. 5. Nonobstructive left nephrolithiasis. Electronically Signed   By: Marin Roberts M.D.   On: 02/15/2022 12:15   DG Chest Port 1 View  Result Date: 02/15/2022 CLINICAL DATA:  Shortness of breath. EXAM: PORTABLE CHEST 1 VIEW COMPARISON:  01/24/2022 FINDINGS: Cardiomegaly again identified. Pulmonary vascular congestion again noted. Increasing bilateral interstitial and airspace opacities within the mid and lower lungs noted, question edema versus infection. A small RIGHT pleural effusion is again noted. There is no evidence of pneumothorax. IMPRESSION: 1. Increasing bilateral interstitial and airspace opacities, question edema versus infection. 2. Unchanged small RIGHT pleural effusion. Electronically Signed   By: Margarette Canada M.D.   On: 02/15/2022 09:07    Assessment / Plan: 1) Acute on chronic anemia 2) Melena 3) History of small bowel AVMs 4) Symptomatic anemia He has had an extensive evaluation in the past for recurrent GI bleeds, to include multiple endoscopies this year (several VCE, small bowel endoscopy, EGD, etc.), notable for small bowel AVMs.  Tried treating with long-acting octreotide, but this was ultimately not approved by his insurance as outpatient use.  Tried referring over to [facility] for double-balloon enteroscopy in the past as well.  Ultimately, based on extensive evaluation today, significant comorbidities, and likelihood for recurrence despite endoscopic  intervention, I think the better part of valor would be medical management rather than repeat endoscopic therapy at this juncture.  Received 2 units PRBCs.  Hgb 6.6>6.3>8.4>8.5>7.4 grams this AM, but no BM in close to 24 hours. - Continue serial CBC checks - Continue PPI gtt. --Start octreotide gtt if bleeding recurs - Will allow full liquid diet for now since no BM in close to 24 hours.   5) Abdominal pain 6) Cirrhosis 7) HCC - CT scan without acute pathology.  Shows moderate to large volume ascites.  Will order paracentesis with max 4 Liters removed and check fluid studies to rule out SBP. - Will need to follow-up in the Hematology clinic - Clinical description does not seem consistent with variceal bleed that would require emergent endoscopy - Agree with ABX for GI bleed in setting of cirrhosis/ascites (on Rocephin)   8) ESRD on HD - HD per Nephrology    LOS: 1 day   Jessica D. Zehr  02/16/2022, 10:17 AM   Attending Physician Note   I have taken an interval history, reviewed the chart and examined the patient. I performed more than 50% of this encounter in conjunction with the APP. I agree with the APP's note, impression and recommendations with my edits. My additional impressions and recommendations are as follows.   *GI bleed with melena, resolving, due to known SB AVMs. No bowel movements since transfer from [facility].  *Acute on chronic anemia, Hgb=7.4 today *Compensated cirrhosis *ESRD on HD  *If  bleeding recurs start octreotide gtt *No plans for repeat endoscopic procedures at this time - see 10/21 consult note  *Plan for transfusions as needed to maintain Hgb > 7  *Outpatient Hematology mgmt of anemia *Full liquid diet today and advance diet as tolerated  Lucio Edward, MD [facility] See [name], [facility], for our on call provider

## 2022-02-16 NOTE — Assessment & Plan Note (Signed)
Patient has right lower extremity edema, no dyspnea  No clinical signs of frank volume overload Plan to continue blood pressure monitoring and ultrafiltration on HD.

## 2022-02-16 NOTE — Progress Notes (Addendum)
  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, S/P Y90 ablation 11/28/2021), ongoing alcohol abuse, iron deficiency anemia, ESRD (HD MWF), history of subarachnoid hemorrhage (2019), peripheral vascular disease (S/P right SFA stent), status post left BKA, diastolic congestive heart failure, pulmonary hypertension, nicotine dependence (cigarettes), gastroesophageal reflux disease who presented to [facility] emergency department with complaints of abdominal pain and dark stools.  Upon evaluation the patient was felt to be suffering from a gastrointestinal bleed considering patient's complicated history of GI bleeding in the past.  Patient was found to have a hemoglobin of 6.6 with several additional episodes of melena noted in the emergency department.  2 unit packed red blood cell transfusion was ordered and orders were placed for the patient to be admitted to [facility].  Assessment and Plan: * Acute upper GI bleed Follow up hgb is 7,4  Plan to continue with proton pump inhibitors. Patient tolerating po well.  If bleeding recurs plan to start patient on octreotide.  Keep Hgb more than 7   Acute on chronic diastolic CHF (congestive heart failure) ([place]) Patient has right lower extremity edema, no dyspnea  No clinical signs of frank volume overload Plan to continue blood pressure monitoring and ultrafiltration on HD.   Acute cardiogenic pulmonary edema (HCC) Please see assessment and plan above  ESRD (end stage renal disease) on dialysis [facility]) Patient receives dialysis Monday Wednesday and Friday Nephrology has been contacted, likely  HD tomorrow per his usual regimen.   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  Antibiotic therapy to prevent SBP Patient with no signs of alcohol withdrawal continue close monitoring.  Add prn lorazepam dc CIWA  Essential hypertension Continue blood pressure control with amlodipine   Mixed hyperlipidemia Continuing home regimen of lipid lowering therapy.         Subjective: Patient with no chest pain, no further bleeding   Physical Exam: Vitals:   02/16/22 0000 02/16/22 0400 02/16/22 0902 02/16/22 1331  BP: (!) 150/100 (!) 119/104 (!) 150/92 (!) 150/83  Pulse: 91 91 84 90  Resp: '20 16 16 17  '$ Temp:  97.9 F (36.6 C) 98 F (36.7 C) 98 F (36.7 C)  TempSrc:  Oral    SpO2: 95% 95% 96%   Weight:      Height:       Neurology awake and alert ENT with mild pallor Cardiovascular with S1 and S2 present with no rubs or gallops, positive systolic murmur at the apex Respiratory with scattered rales at bases with no wheezing Abdomen with no distention  Right lower extremity edema Left with BKA  Data Reviewed:    Family Communication: no family at the bedside   Disposition: Status is: Inpatient Remains inpatient appropriate because: gi bleeding   Planned Discharge Destination: Home    Author: Tawni Millers, MD 02/16/2022 2:28 PM  For on call review [email or web address].

## 2022-02-16 NOTE — Consult Note (Signed)
[facility] Renal Consultation Note    Indication for Consultation:  Management of ESRD/hemodialysis; anemia, hypertension/volume and secondary hyperparathyroidism  NOM:VEHMCNO, Milford Cage, NP  HPI: Daniel [name] is a 61 y.o. male with ESRD on HD MWF at [facility]. His past medical history is significant for recent diagnosis of hepatocellular carcinoma several months ago (not on treatment-follows with 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  Patient presented to [facility] ED c/o ABD distention and melena stools. He then was transferred here for ongoing management and hemodialysis needs. GI is following. S/p 2 units PRBCs at admit for Hgb 6.6. Hgb now 7.4. Per GI, clinical presentation not consistent with variceal bleed, thus emergent EGD is not indicated. Continue PPI and labs. On full liquid diet. Seen and examined patient at bedside. Seen sitting up in bed inquiring when can he eat. He denies SOB, CP, and N/V. Last HD on 10/20 where he received full treatment. CXR showed BL interstitial and airspace opacities and unchanged R pleural effusion. Noted IR consulted for paracentesis. Plan for HD 02/17/22 per his routine schedule.  Past Medical History:  Diagnosis Date   Anemia    Diabetes mellitus without complication [facility])    patient denies   Dialysis patient [facility])    End stage chronic kidney disease ([place])    Hypertension    ICH (intracerebral hemorrhage) ([place]) 05/20/2017   PAD (peripheral artery disease) ([facility])    Shoulder pain, left 06/28/2013   Past Surgical History:   Procedure Laterality Date   A/V FISTULAGRAM N/A 08/15/2020   Procedure: A/V FISTULAGRAM - Left Upper;  Surgeon: Cherre Robins, MD;  Location: [place] CV LAB;  Service: Cardiovascular;  Laterality: N/A;   ABDOMINAL AORTOGRAM W/LOWER EXTREMITY N/A 04/08/2021   Procedure: ABDOMINAL AORTOGRAM W/LOWER EXTREMITY;  Surgeon: Waynetta Sandy, MD;  Location: [place] CV LAB;  Service: Cardiovascular;  Laterality: N/A;   AMPUTATION Left 04/16/2021   Procedure: LEFT BELOW KNEE AMPUTATION;  Surgeon: Angelia Mould, MD;  Location: [place];  Service: Vascular;  Laterality: Left;   AMPUTATION Left 06/17/2021   Procedure: REVISION AMPUTATION BELOW KNEE;  Surgeon: Broadus John, MD;  Location: [place];  Service: Vascular;  Laterality: Left;   APPENDECTOMY     APPLICATION OF WOUND VAC Left 06/17/2021   Procedure: APPLICATION OF WOUND VAC;  Surgeon: Broadus John, MD;  Location: [place];  Service: Vascular;  Laterality: Left;   AV FISTULA PLACEMENT Left 08/03/2019   Procedure: LEFT ARM ARTERIOVENOUS (AV) CEPHALIC  FISTULA CREATION;  Surgeon: Waynetta Sandy, MD;  Location: [place];  Service: Vascular;  Laterality: Left;   BIOPSY  06/30/2019   Procedure: BIOPSY;  Surgeon: Ronald Lobo, MD;  Location: [facility] ENDOSCOPY;  Service: Endoscopy;;   BIOPSY  08/02/2019   Procedure: BIOPSY;  Surgeon: Yetta Flock, MD;  Location: [facility] ENDOSCOPY;  Service: Gastroenterology;;   BIOPSY  02/08/2021   Procedure: BIOPSY;  Surgeon: Sharyn Creamer, MD;  Location: [facility] ENDOSCOPY;  Service: Gastroenterology;;   BIOPSY  02/24/2021   Procedure: BIOPSY;  Surgeon: [name], MD;  Location: [facility] ENDOSCOPY;  Service: Gastroenterology;;   BIOPSY  03/26/2021   Procedure: BIOPSY;  Surgeon: Gerrit Heck  V, DO;  Location: [place];  Service: Gastroenterology;;   BIOPSY  08/06/2021   Procedure: BIOPSY;  Surgeon: [name], MD;  Location: [place];  Service: Gastroenterology;;    BIOPSY  09/15/2021   Procedure: BIOPSY;  Surgeon: Sharyn Creamer, MD;  Location: [place];  Service: Gastroenterology;;   BRONCHIAL BIOPSY  12/26/2021   Procedure: BRONCHIAL BIOPSIES;  Surgeon: Juanito Doom, MD;  Location: [place];  Service: Cardiopulmonary;;   BRONCHIAL WASHINGS  12/26/2021   Procedure: BRONCHIAL WASHINGS;  Surgeon: Juanito Doom, MD;  Location: [place];  Service: Cardiopulmonary;;   COLONOSCOPY  01/23/2012   Procedure: COLONOSCOPY;  Surgeon: Danie Binder, MD;  [facility];  Service: Endoscopy;  Laterality: N/A;  11:10 AM   COLONOSCOPY WITH PROPOFOL N/A 06/30/2019   Procedure: COLONOSCOPY WITH PROPOFOL;  Surgeon: Ronald Lobo, MD;  Location: [place];  Service: Endoscopy;  Laterality: N/A;   ENTEROSCOPY N/A 08/02/2019   Procedure: ENTEROSCOPY;  Surgeon: Yetta Flock, MD;  Location: [facility] ENDOSCOPY;  Service: Gastroenterology;  Laterality: N/A;   ENTEROSCOPY N/A 02/24/2021   Procedure: ENTEROSCOPY;  Surgeon: [name], MD;  Location: [facility] ENDOSCOPY;  Service: Gastroenterology;  Laterality: N/A;   ENTEROSCOPY N/A 03/26/2021   Procedure: ENTEROSCOPY;  Surgeon: Lavena Bullion, DO;  Location: [place];  Service: Gastroenterology;  Laterality: N/A;   ENTEROSCOPY N/A 07/05/2021   Procedure: ENTEROSCOPY;  Surgeon: Carol Ada, MD;  Location: [place];  Service: Gastroenterology;  Laterality: N/A;   ENTEROSCOPY N/A 08/06/2021   Procedure: ENTEROSCOPY;  Surgeon: [name], MD;  Location: [facility] ENDOSCOPY;  Service: Gastroenterology;  Laterality: N/A;   ENTEROSCOPY N/A 08/24/2021   Procedure: ENTEROSCOPY;  Surgeon: Ladene Artist, MD;  Location: [facility] ENDOSCOPY;  Service: Gastroenterology;  Laterality: N/A;   ENTEROSCOPY N/A 09/15/2021   Procedure: ENTEROSCOPY;  Surgeon: Sharyn Creamer, MD;  Location: [facility] ENDOSCOPY;  Service: Gastroenterology;  Laterality: N/A;   ENTEROSCOPY N/A 10/06/2021   Procedure: ENTEROSCOPY;   Surgeon: [name], MD;  Location: [facility] ENDOSCOPY;  Service: Gastroenterology;  Laterality: N/A;   ENTEROSCOPY N/A 11/14/2021   Procedure: ENTEROSCOPY;  Surgeon: Doran Stabler, MD;  Location: [facility] ENDOSCOPY;  Service: Gastroenterology;  Laterality: N/A;   ESOPHAGOGASTRODUODENOSCOPY N/A 08/10/2020   Procedure: ESOPHAGOGASTRODUODENOSCOPY (EGD);  Surgeon: Jerene Bears, MD;  Location: [facility] ENDOSCOPY;  Service: Gastroenterology;  Laterality: N/A;   ESOPHAGOGASTRODUODENOSCOPY (EGD) WITH PROPOFOL N/A 06/30/2019   Procedure: ESOPHAGOGASTRODUODENOSCOPY (EGD) WITH PROPOFOL;  Surgeon: Ronald Lobo, MD;  Location: [place];  Service: Endoscopy;  Laterality: N/A;   ESOPHAGOGASTRODUODENOSCOPY (EGD) WITH PROPOFOL N/A 01/12/2021   Procedure: ESOPHAGOGASTRODUODENOSCOPY (EGD) WITH PROPOFOL;  Surgeon: Sharyn Creamer, MD;  Location: [place];  Service: Gastroenterology;  Laterality: N/A;   ESOPHAGOGASTRODUODENOSCOPY (EGD) WITH PROPOFOL N/A 02/08/2021   Procedure: ESOPHAGOGASTRODUODENOSCOPY (EGD) WITH PROPOFOL;  Surgeon: Sharyn Creamer, MD;  Location: [place];  Service: Gastroenterology;  Laterality: N/A;   FISTULA SUPERFICIALIZATION Left 10/17/2019   Procedure: LEFT UPPER EXTREMITY FISTULA REVISION, SIDE BRANCH LIGATION,  AND SUPERFICIALIZATION;  Surgeon: Marty Heck, MD;  Location: [place];  Service: Vascular;  Laterality: Left;   FISTULA SUPERFICIALIZATION Left 00/93/8182   Procedure: PLICATION OF ANEURYSM LEFT ARTERIOVENOUS FISTULA;  Surgeon: Angelia Mould, MD;  Location: [place];  Service: Vascular;  Laterality: Left;   FLEXIBLE SIGMOIDOSCOPY N/A 07/05/2021   Procedure: FLEXIBLE SIGMOIDOSCOPY;  Surgeon: Carol Ada, MD;  Location: [place];  Service: Gastroenterology;  Laterality: N/A;   GIVENS CAPSULE  STUDY N/A 06/30/2019   Procedure: GIVENS CAPSULE STUDY;  Surgeon: Ronald Lobo, MD;  Location: [facility] ENDOSCOPY;  Service: Endoscopy;  Laterality: N/A;   GIVENS CAPSULE STUDY N/A  08/25/2021   Procedure: GIVENS CAPSULE STUDY;  Surgeon: Ladene Artist, MD;  Location: [place];  Service: Gastroenterology;  Laterality: N/A;   GIVENS CAPSULE STUDY N/A 10/06/2021   Procedure: GIVENS CAPSULE STUDY;  Surgeon: [name], MD;  Location: [place];  Service: Gastroenterology;  Laterality: N/A;   HEMOSTASIS CLIP PLACEMENT  08/10/2020   Procedure: HEMOSTASIS CLIP PLACEMENT;  Surgeon: Jerene Bears, MD;  Location: [place];  Service: Gastroenterology;;   HEMOSTASIS CLIP PLACEMENT  01/12/2021   Procedure: HEMOSTASIS CLIP PLACEMENT;  Surgeon: Sharyn Creamer, MD;  Location: [place];  Service: Gastroenterology;;   HEMOSTASIS CONTROL  08/02/2019   Procedure: HEMOSTASIS CONTROL;  Surgeon: Yetta Flock, MD;  Location: [place];  Service: Gastroenterology;;   HOT HEMOSTASIS  02/24/2021   Procedure: HOT HEMOSTASIS (ARGON PLASMA COAGULATION/BICAP);  Surgeon: [name], MD;  Location: [facility] ENDOSCOPY;  Service: Gastroenterology;;   HOT HEMOSTASIS N/A 03/26/2021   Procedure: HOT HEMOSTASIS (ARGON PLASMA COAGULATION/BICAP);  Surgeon: Lavena Bullion, DO;  Location: [facility] ENDOSCOPY;  Service: Gastroenterology;  Laterality: N/A;   HOT HEMOSTASIS N/A 07/05/2021   Procedure: HOT HEMOSTASIS (ARGON PLASMA COAGULATION/BICAP);  Surgeon: Carol Ada, MD;  Location: [place];  Service: Gastroenterology;  Laterality: N/A;   HOT HEMOSTASIS N/A 09/15/2021   Procedure: HOT HEMOSTASIS (ARGON PLASMA COAGULATION/BICAP);  Surgeon: Sharyn Creamer, MD;  Location: [place];  Service: Gastroenterology;  Laterality: N/A;   HOT HEMOSTASIS N/A 10/06/2021   Procedure: HOT HEMOSTASIS (ARGON PLASMA COAGULATION/BICAP);  Surgeon: [name], MD;  Location: [place];  Service: Gastroenterology;  Laterality: N/A;   INCISION AND DRAINAGE ABSCESS N/A 06/29/2016   Procedure: INCISION AND DRAINAGE ABDOMINAL WALL ABSCESS;  Surgeon: Alphonsa Overall, MD;  Location: [facility];   Service: General;  Laterality: N/A;   INSERTION OF DIALYSIS CATHETER Right 08/03/2019   Procedure: INSERTION OF DIALYSIS CATHETER;  Surgeon: Waynetta Sandy, MD;  Location: [place];  Service: Vascular;  Laterality: Right;   INSERTION OF DIALYSIS CATHETER Right 10/22/2019   Procedure: INSERTION OF 23CM TUNNELED DIALYSIS CATHETER RIGHT INTERNAL JUGULAR;  Surgeon: Angelia Mould, MD;  Location: [place];  Service: Vascular;  Laterality: Right;   INSERTION OF DIALYSIS CATHETER Right 08/12/2020   Procedure: INSERTION OF Right internal Jugular TUNNELED  DIALYSIS CATHETER.;  Surgeon: Waynetta Sandy, MD;  Location: [place];  Service: Vascular;  Laterality: Right;   INSERTION OF DIALYSIS CATHETER N/A 04/19/2021   Procedure: INSERTION OF TUNNELED DIALYSIS CATHETER;  Surgeon: Angelia Mould, MD;  Location: [facility] OR;  Service: Vascular;  Laterality: N/A;   IR 3D INDEPENDENT WKST  11/07/2021   IR ANGIOGRAM SELECTIVE EACH ADDITIONAL VESSEL  11/07/2021   IR ANGIOGRAM SELECTIVE EACH ADDITIONAL VESSEL  11/07/2021   IR ANGIOGRAM SELECTIVE EACH ADDITIONAL VESSEL  11/07/2021   IR ANGIOGRAM SELECTIVE EACH ADDITIONAL VESSEL  11/28/2021   IR ANGIOGRAM VISCERAL SELECTIVE  11/07/2021   IR ANGIOGRAM VISCERAL SELECTIVE  11/28/2021   IR EMBO ARTERIAL NOT HEMORR HEMANG INC GUIDE ROADMAPPING  11/07/2021   IR EMBO TUMOR ORGAN ISCHEMIA INFARCT INC GUIDE ROADMAPPING  11/28/2021   IR RADIOLOGIST EVAL & MGMT  09/12/2021   IR US GUIDE VASC ACCESS LEFT  11/07/2021   IR US GUIDE VASC ACCESS LEFT  11/28/2021   Left heel surgery  LOWER EXTREMITY ANGIOGRAPHY N/A 07/08/2021   Procedure: Lower Extremity Angiography;  Surgeon: Cherre Robins, MD;  Location: [place] CV LAB;  Service: Cardiovascular;  Laterality: N/A;   PENILE BIOPSY N/A 03/26/2020   Procedure: PENILE ULCER DEBRIDEMENT;  Surgeon: Remi Haggard, MD;  Location[facility];  Service: Urology;  Laterality: N/A;  30 MINS   PERIPHERAL VASCULAR INTERVENTION  Right 07/08/2021   Procedure: PERIPHERAL VASCULAR INTERVENTION;  Surgeon: Cherre Robins, MD;  Location: [place] CV LAB;  Service: Cardiovascular;  Laterality: Right;   SCLEROTHERAPY  01/12/2021   Procedure: SCLEROTHERAPY;  Surgeon: Sharyn Creamer, MD;  Location: [facility] ENDOSCOPY;  Service: Gastroenterology;;   Clide Deutscher  02/24/2021   Procedure: Clide Deutscher;  Surgeon: [name], MD;  Location: [place];  Service: Gastroenterology;;   SUBMUCOSAL TATTOO INJECTION  08/24/2021   Procedure: SUBMUCOSAL TATTOO INJECTION;  Surgeon: Ladene Artist, MD;  Location: [place];  Service: Gastroenterology;;   SUBMUCOSAL TATTOO INJECTION  09/15/2021   Procedure: SUBMUCOSAL TATTOO INJECTION;  Surgeon: Sharyn Creamer, MD;  Location: [place];  Service: Gastroenterology;;   VIDEO BRONCHOSCOPY N/A 12/26/2021   Procedure: VIDEO BRONCHOSCOPY WITH FLUORO;  Surgeon: Juanito Doom, MD;  Location: [place];  Service: Cardiopulmonary;  Laterality: N/A;   Family History  Problem Relation Age of Onset   Colon cancer Neg Hx    Social History:  reports that he has been smoking cigarettes. He has a 22.50 pack-year smoking history. He has never used smokeless tobacco. He reports that he does not currently use alcohol. He reports that he does not currently use drugs after having used the following drugs: "Crack" cocaine. Allergies  Allergen Reactions   Seroquel [Quetiapine] Other (See Comments)    Tardive kinesia Dystonia    Dilaudid [Hydromorphone Hcl] Itching and Other (See Comments)    Pt reports itchiness after IM injection    Prior to Admission medications   Medication Sig Start Date End Date Taking? Authorizing Provider  amLODipine (NORVASC) 5 MG tablet Take 1 tablet (5 mg total) by mouth daily. 02/10/22 05/11/22  [name], NP  atorvastatin (LIPITOR) 40 MG tablet Take 1 tablet (40 mg total) by mouth daily. 09/18/21 12/26/22  [place] Donnamarie Poag, DO  folic acid (FOLVITE) 1  MG tablet Take 1 tablet (1 mg total) by mouth daily. 12/12/21   Eugenie Filler, MD  gabapentin (NEURONTIN) 300 MG capsule Take 300 mg by mouth 3 (three) times daily. 02/07/22   [provider]  loperamide (IMODIUM) 2 MG capsule Take 1 capsule (2 mg total) by mouth 4 (four) times daily as needed for diarrhea or loose stools. 01/31/22   Carmin Muskrat, MD  Multiple Vitamin (MULTIVITAMIN WITH MINERALS) TABS tablet Take 1 tablet by mouth daily. 12/12/21   Eugenie Filler, MD  pantoprazole (PROTONIX) 40 MG tablet Take 1 tablet (40 mg total) by mouth 2 (two) times daily before a meal. 10/10/21 04/05/22  Mercy Riding, MD  thiamine (VITAMIN B1) 100 MG tablet Take 1 tablet (100 mg total) by mouth daily. 12/12/21   Eugenie Filler, MD  traZODone (DESYREL) 50 MG tablet Take 1 tablet (50 mg total) by mouth daily as needed for sleep. Patient taking differently: Take 50 mg by mouth at bedtime as needed for sleep. 02/10/22   [name], NP  colchicine 0.6 MG tablet Take 0.5 tablets (0.3 mg total) by mouth 2 (two) times daily. 07/24/20 07/29/20  Noemi Chapel, MD  furosemide (LASIX) 40 MG  tablet Take 1 tablet (40 mg total) by mouth daily. 07/25/19 02/09/20  Ladona Horns, MD   Current Facility-Administered Medications  Medication Dose Route Frequency Provider Last Rate Last Admin   0.9 %  sodium chloride infusion (Manually program via Guardrails IV Fluids)   Intravenous Once Kayleen Memos, DO       acetaminophen (TYLENOL) tablet 650 mg  650 mg Oral Q6H PRN Irene Pap N, DO       amLODipine (NORVASC) tablet 5 mg  5 mg Oral Daily Shalhoub, Sherryll Burger, MD   5 mg at 02/16/22 0906   atorvastatin (LIPITOR) tablet 40 mg  40 mg Oral QHS Vernelle Emerald, MD   40 mg at 02/15/22 2043   cefTRIAXone (ROCEPHIN) 2 g in sodium chloride 0.9 % 100 mL IVPB  2 g Intravenous Q24H Irene Pap N, DO 200 mL/hr at 02/16/22 0423 2 g at 91/47/82 9562   folic acid (FOLVITE) tablet 1 mg  1 mg Oral Daily Shalhoub,  Sherryll Burger, MD   1 mg at 02/16/22 1308   guaiFENesin-dextromethorphan (ROBITUSSIN DM) 100-10 MG/5ML syrup 5 mL  5 mL Oral Q4H PRN Arrien, Jimmy Picket, MD   5 mL at 02/16/22 1330   hydrALAZINE (APRESOLINE) injection 10 mg  10 mg Intravenous Q6H PRN Vernelle Emerald, MD       HYDROmorphone (DILAUDID) injection 0.5 mg  0.5 mg Intravenous Q4H PRN Irene Pap N, DO   0.5 mg at 02/15/22 1010   LORazepam (ATIVAN) tablet 1 mg  1 mg Oral Q6H PRN Arrien, Jimmy Picket, MD       multivitamin with minerals tablet 1 tablet  1 tablet Oral Daily Shalhoub, Sherryll Burger, MD   1 tablet at 02/16/22 6578   oxyCODONE (Oxy IR/ROXICODONE) immediate release tablet 5 mg  5 mg Oral Q6H PRN Irene Pap N, DO   5 mg at 02/15/22 2043   pantoprozole (PROTONIX) 80 mg /NS 100 mL infusion  8 mg/hr Intravenous Continuous Fatima Blank, MD 10 mL/hr at 02/16/22 0906 8 mg/hr at 02/16/22 0906   polyethylene glycol (MIRALAX / GLYCOLAX) packet 17 g  17 g Oral Daily PRN Irene Pap N, DO       prochlorperazine (COMPAZINE) injection 5 mg  5 mg Intravenous Q6H PRN Irene Pap N, DO       thiamine (VITAMIN B1) tablet 100 mg  100 mg Oral Daily Shalhoub, Sherryll Burger, MD   100 mg at 02/16/22 0904   traZODone (DESYREL) tablet 50 mg  50 mg Oral Daily PRN Vernelle Emerald, MD   50 mg at 02/15/22 2043   Labs: Basic Metabolic Panel: Recent Labs  Lab 02/14/22 2100 02/15/22 0409 02/16/22 0542  NA 129* 131* 130*  K 3.3* 3.5 3.6  CL 93* 97* 92*  CO2 '27 25 26  '$ GLUCOSE 96 99 73  BUN 26* 38* 46*  CREATININE 4.81* 4.91* 6.29*  CALCIUM 7.5* 7.4* 8.5*  PHOS  --  3.3  --    Liver Function Tests: Recent Labs  Lab 02/14/22 2100 02/15/22 0409 02/16/22 0542  AST 53* 52* 51*  ALT '17 17 14  '$ ALKPHOS 110 94 90  BILITOT 1.4* 1.4* 1.5*  PROT 7.8 6.8 6.9  ALBUMIN 2.1* 1.9* 1.9*   Recent Labs  Lab 02/15/22 0409  LIPASE 47   No results for input(s): "AMMONIA" in the last 168 hours. CBC: Recent Labs  Lab 02/11/22 2321  02/14/22 2100 02/15/22 0235 02/15/22 0409 02/15/22 1552 02/15/22 2029 02/16/22  0542  WBC 3.8* 3.9*  --  3.9*  --   --  3.3*  NEUTROABS  --  2.6  --  2.0  --   --  1.9  HGB 8.0* 7.6*   < > 6.3* 8.4* 8.5* 7.4*  HCT 24.2* 22.4*   < > 19.0* 25.7* 24.1* 21.2*  MCV 100.4* 98.2  --  100.5*  --   --  92.6  PLT 94* 101*  --  94*  --   --  80*   < > = values in this interval not displayed.   Cardiac Enzymes: No results for input(s): "CKTOTAL", "CKMB", "CKMBINDEX", "TROPONINI" in the last 168 hours. CBG: No results for input(s): "GLUCAP" in the last 168 hours. Iron Studies: No results for input(s): "IRON", "TIBC", "TRANSFERRIN", "FERRITIN" in the last 72 hours. Studies/Results: US Paracentesis  Result Date: 02/16/2022 INDICATION: Patient with history of ESRD presents with abdominal distension, CT abdomen pelvis without contrast yesterday showed moderate ascites. Request for therapeutic and diagnostic paracentesis. EXAM: ULTRASOUND GUIDED  PARACENTESIS MEDICATIONS: 10 mL 1% lidocaine COMPLICATIONS: None immediate. PROCEDURE: Informed written consent was obtained from the patient after a discussion of the risks, benefits and alternatives to treatment. A timeout was performed prior to the initiation of the procedure. Initial ultrasound scanning demonstrates a small amount of ascites within the left upper abdominal quadrant. The right lower abdomen was prepped and draped in the usual sterile fashion. 1% lidocaine was used for local anesthesia. Following this, a 19 gauge, 7-cm, Yueh catheter was introduced. An ultrasound image was saved for documentation purposes. The paracentesis was performed. The catheter was removed and a dressing was applied. The patient tolerated the procedure well without immediate post procedural complication. FINDINGS: A total of approximately 250 mL of hazy yellow fluid was removed. Samples were sent to the laboratory as requested by the clinical team. IMPRESSION: Successful  ultrasound-guided paracentesis yielding 250 mLof peritoneal fluid. Read by: Durenda Guthrie, PA-C Electronically Signed   By: Lucrezia Europe M.D.   On: 02/16/2022 13:23   CT ABDOMEN PELVIS WO CONTRAST  Result Date: 02/15/2022 CLINICAL DATA:  Abdominal pain.  Melena. EXAM: CT ABDOMEN AND PELVIS WITHOUT CONTRAST TECHNIQUE: Multidetector CT imaging of the abdomen and pelvis was performed following the standard protocol without IV contrast. RADIATION DOSE REDUCTION: This exam was performed according to the departmental dose-optimization program which includes automated exposure control, adjustment of the mA and/or kV according to patient size and/or use of iterative reconstruction technique. COMPARISON:  CT AP 01/31/22 FINDINGS: Lower chest: Redemonstrated moderate right and small left pleural effusion with associated bibasilar pulmonary opacities, unchanged compared to recent prior CT chest. Hepatobiliary: Redemonstrated nodular liver contour compatible with patient's history of cirrhosis. Redemonstrated hypodense lesion in the central liver, not significantly changed compared to prior exam. Gallbladder is decompressed with a small stone at the gallbladder neck. There is persistent moderate volume ascites, not significantly changed compared to prior exam. Pancreas: No pancreatic ductal dilatation or surrounding inflammatory changes. Spleen: Normal in size without focal abnormality. Adrenals/Urinary Tract: Bilateral glands are normal in size. Bilateral kidneys are small in size. There is evidence of left-sided nephrolithiasis without hydronephrosis. The urinary bladder is fluid-filled without focal wall thickening. Stomach/Bowel: The stomach, small bowel, large bowel are normal in caliber. No evidence of bowel obstruction. No evidence of pneumoperitoneum. No evidence of pneumatosis. Appendix is not definitively visualized. A candidate appendix along the right lateral margin of the cecum appears normal (series 2, image 64).  Vascular/Lymphatic: There is extensive  vascular calcification of the abdominopelvic vasculature, as well as the bilateral outflow vessels. Reproductive: Prostate is normal in size. Other: No abdominal wall hernia or abnormality. Musculoskeletal: No acute or significant osseous findings. IMPRESSION: 1. No acute abdominal pathology this noncontrast enhanced exam. Redemonstrated findings of hepatic cirrhosis with moderate to large volume ascites, unchanged compared to prior exam from 01/31/2022. Given history of melena, if there is concern for variceal bleed, further evaluation with a contrast-enhanced GI bleed protocol study is recommended. 2. Redemonstrated hypodense lesion in the central liver, not significantly unchanged compared to prior exam. 3. Redemonstrated moderate right and small left pleural effusions with associated bibasilar pulmonary opacities, unchanged compared to recent prior CT chest. 4. Cholelithiasis without evidence of cholecystitis. 5. Nonobstructive left nephrolithiasis. Electronically Signed   By: Marin Roberts M.D.   On: 02/15/2022 12:15   DG Chest Port 1 View  Result Date: 02/15/2022 CLINICAL DATA:  Shortness of breath. EXAM: PORTABLE CHEST 1 VIEW COMPARISON:  01/24/2022 FINDINGS: Cardiomegaly again identified. Pulmonary vascular congestion again noted. Increasing bilateral interstitial and airspace opacities within the mid and lower lungs noted, question edema versus infection. A small RIGHT pleural effusion is again noted. There is no evidence of pneumothorax. IMPRESSION: 1. Increasing bilateral interstitial and airspace opacities, question edema versus infection. 2. Unchanged small RIGHT pleural effusion. Electronically Signed   By: Margarette Canada M.D.   On: 02/15/2022 09:07    ROS: All others negative except those listed in HPI.   Physical Exam: Vitals:   02/16/22 0000 02/16/22 0400 02/16/22 0902 02/16/22 1331  BP: (!) 150/100 (!) 119/104 (!) 150/92 (!) 150/83  Pulse: 91 91 84  90  Resp: '20 16 16 17  '$ Temp:  97.9 F (36.6 C) 98 F (36.7 C) 98 F (36.7 C)  TempSrc:  Oral    SpO2: 95% 95% 96%   Weight:      Height:         General: WDWN NAD Lungs: CTA bilaterally. No wheeze, rales or rhonchi. Breathing is unlabored. Heart: RRR. No murmur, rubs or gallops.  Abdomen: soft, nontender Lower extremities: L BKA, 1+ edema RLE Neuro: AAOx3. Moves all extremities spontaneously. Dialysis Access: L AVF (+) B/T  Dialysis Orders:  MWF - [place] 4hrs, BFR 450, DFR 500,  EDW 98.8kg, 2K/ 2Ca NO HEPARIN NO ESA D/T ACTIVE CANCER  Last Labs: Hgb 7.4, K 3.6, Ca 8.5, Alb 1.9  Assessment/Plan: Acute on chronic anemia/Melena - had multiple GI procedures completed in the past. Tried treating with octreotide but not approved by his insurance. Continue medical management: Octreotide gtt if bleeding re-occurs, PPI gtt, now on full liquid diet, follow labs ABD distention - IR consulted for paracentesis ESRD - on HD MWF. Next HD 10/23 per his routine schedule Hypertension/volume  - BP stable and overall euvolemic on exam. Continue home medications. Anemia of CKD - Hgb 7.4. See above. No ESA d/t active cancer Secondary Hyperparathyroidism -  Corr Ca slightly elevated. Hold VDRA for now. PO4 okay. Follow trend Nutrition - Now on FLD. Advance to renal diet once clinically stable  Tobie Poet, NP [employer] 02/16/2022, 3:00 PM

## 2022-02-16 NOTE — Telephone Encounter (Signed)
Patient is in and out of the hospital almost weekly . Will not refill any medications until discharge summary because medication changes

## 2022-02-16 NOTE — Assessment & Plan Note (Signed)
SP 2 units PRBC transfusion.  Follow up hgb is 7,4  No sings of active bleeding.   Holding for now octreotide infusion  Continue pantoprazole po   Patient had multiple EGDs SBE and VCEs confirming small bowel AVMS.  Continue medical therapy per GI recommendations.   Acute blood loss anemia due to upper GI bleed, chronic leukopenia and thrombocytopenia Pancytopenia Follow cell count closely

## 2022-02-16 NOTE — Procedures (Signed)
PROCEDURE SUMMARY:  Successful image-guided paracentesis from the left latera abdomen.  Yielded 250 mL of hazy yellow fluid.  No immediate complications.  EBL = trace. Patient tolerated well.   Specimen was sent for labs.  Please see imaging section of Epic for full dictation.   Armando Gang Yuriko Portales PA-C 02/16/2022 12:41 PM

## 2022-02-17 ENCOUNTER — Inpatient Hospital Stay (HOSPITAL_COMMUNITY): Payer: Medicare Other

## 2022-02-17 DIAGNOSIS — R188 Other ascites: Secondary | ICD-10-CM | POA: Diagnosis not present

## 2022-02-17 DIAGNOSIS — I5033 Acute on chronic diastolic (congestive) heart failure: Secondary | ICD-10-CM | POA: Diagnosis not present

## 2022-02-17 DIAGNOSIS — B1920 Unspecified viral hepatitis C without hepatic coma: Secondary | ICD-10-CM

## 2022-02-17 DIAGNOSIS — C220 Liver cell carcinoma: Secondary | ICD-10-CM | POA: Diagnosis not present

## 2022-02-17 DIAGNOSIS — N186 End stage renal disease: Secondary | ICD-10-CM | POA: Diagnosis not present

## 2022-02-17 DIAGNOSIS — K7469 Other cirrhosis of liver: Secondary | ICD-10-CM | POA: Diagnosis not present

## 2022-02-17 DIAGNOSIS — K922 Gastrointestinal hemorrhage, unspecified: Secondary | ICD-10-CM | POA: Diagnosis not present

## 2022-02-17 LAB — TYPE AND SCREEN
ABO/RH(D): B POS
Antibody Screen: NEGATIVE
Unit division: 0
Unit division: 0

## 2022-02-17 LAB — CBC
HCT: 20.6 % — ABNORMAL LOW (ref 39.0–52.0)
Hemoglobin: 7.3 g/dL — ABNORMAL LOW (ref 13.0–17.0)
MCH: 32.7 pg (ref 26.0–34.0)
MCHC: 35.4 g/dL (ref 30.0–36.0)
MCV: 92.4 fL (ref 80.0–100.0)
Platelets: 73 10*3/uL — ABNORMAL LOW (ref 150–400)
RBC: 2.23 MIL/uL — ABNORMAL LOW (ref 4.22–5.81)
RDW: 17.5 % — ABNORMAL HIGH (ref 11.5–15.5)
WBC: 3 10*3/uL — ABNORMAL LOW (ref 4.0–10.5)
nRBC: 0 % (ref 0.0–0.2)

## 2022-02-17 LAB — BASIC METABOLIC PANEL
Anion gap: 13 (ref 5–15)
BUN: 49 mg/dL — ABNORMAL HIGH (ref 8–23)
CO2: 22 mmol/L (ref 22–32)
Calcium: 8 mg/dL — ABNORMAL LOW (ref 8.9–10.3)
Chloride: 93 mmol/L — ABNORMAL LOW (ref 98–111)
Creatinine, Ser: 6.92 mg/dL — ABNORMAL HIGH (ref 0.61–1.24)
GFR, Estimated: 8 mL/min — ABNORMAL LOW (ref >60)
Glucose, Bld: 97 mg/dL (ref 70–99)
Potassium: 3.3 mmol/L — ABNORMAL LOW (ref 3.5–5.1)
Sodium: 128 mmol/L — ABNORMAL LOW (ref 135–145)

## 2022-02-17 LAB — BPAM RBC
Blood Product Expiration Date: 202311032359
Blood Product Expiration Date: 202311032359
ISSUE DATE / TIME: 202310210732
ISSUE DATE / TIME: 202310211135
Unit Type and Rh: 7300
Unit Type and Rh: 7300

## 2022-02-17 LAB — PHOSPHORUS: Phosphorus: 4.7 mg/dL — ABNORMAL HIGH (ref 2.5–4.6)

## 2022-02-17 MED ORDER — PANTOPRAZOLE SODIUM 40 MG PO TBEC
40.0000 mg | DELAYED_RELEASE_TABLET | Freq: Two times a day (BID) | ORAL | Status: DC
  Administered 2022-02-17 – 2022-02-21 (×8): 40 mg via ORAL
  Filled 2022-02-17 (×8): qty 1

## 2022-02-17 NOTE — Telephone Encounter (Signed)
noted 

## 2022-02-17 NOTE — Procedures (Signed)
HD Note:  Some information was entered later than the data was gathered due to patient care needs. The stated time with the data is accurate.   Received patient in bed to unit.  Alert and oriented.  Informed consent signed and in chart.   Patient had episode of coughing an hour before the end of treatment.  He complained of chest muscle pain with the coughing.  Medication given, see MAR   Patient tolerated well.  Transported back to the room  Alert, without acute distress.  Hand-off given to patient's nurse.   Access used: Left AVF Access issues: None  Total UF removed: [facility]

## 2022-02-17 NOTE — Telephone Encounter (Signed)
Noted. Patient currently hospitalized

## 2022-02-17 NOTE — TOC Initial Note (Signed)
Transition of Care [facility]) - Initial/Assessment Note    Patient Details  Name: Daniel [name] MRN: [id number] Date of Birth: 05/23/1960  Transition of Care [facility]) CM/SW Contact:    Cyndi Bender, RN Phone Number: 02/17/2022, 1:18 PM  Clinical Narrative:                  Spoke to patient at bedside.  Patient drives himself to dialysis and apts. Patient can afford his medications and follows up with his PCP Dr. Oletta Lamas. Patient declines substance abuse counseling.   TOC will continue to follow for needs.   Expected Discharge Plan: Home/Self Care Barriers to Discharge: Continued Medical Work up   Patient Goals and CMS Choice Patient states their goals for this hospitalization and ongoing recovery are:: return home      Expected Discharge Plan and Services Expected Discharge Plan: Home/Self Care       Living arrangements for the past 2 months: Single Family Home                                      Prior Living Arrangements/Services Living arrangements for the past 2 months: Single Family Home Lives with:: Self Patient language and need for interpreter reviewed:: Yes Do you feel safe going back to the place where you live?: Yes            Criminal Activity/Legal Involvement Pertinent to Current Situation/Hospitalization: No - Comment as needed  Activities of Daily Living      Permission Sought/Granted                  Emotional Assessment Appearance:: Appears stated age Attitude/Demeanor/Rapport: Engaged Affect (typically observed): Accepting Orientation: : Oriented to Self, Oriented to  Time, Oriented to Place Alcohol / Substance Use: Not Applicable Psych Involvement: No (comment)  Admission diagnosis:  Upper GI bleeding [K92.2] GI bleed [K92.2] Patient Active Problem List   Diagnosis Date Noted   Cirrhosis of liver with ascites ([place])    Acute cardiogenic pulmonary edema ([place]) 01/02/2022   Bilateral pleural effusion 01/02/2022    Volume overload 12/25/2021   Acute on chronic anemia    ESRD on dialysis ([place])    Hyponatremia 12/09/2021   Mixed hyperlipidemia 11/13/2021   Musculoskeletal neck pain 11/13/2021   Acute blood loss anemia 11/12/2021   Iron deficiency anemia due to chronic blood loss 10/17/2021   Euthyroid sick syndrome 10/10/2021   Elevated troponin 10/10/2021   LLQ abdominal pain 10/08/2021   Physical deconditioning 10/08/2021   Cognitive decline 10/08/2021   Left flank pain 10/07/2021   Intestinal polyps 10/06/2021   Portal hypertensive gastropathy ([place]) 10/06/2021   Rotator cuff syndrome 10/05/2021   Epistaxis 10/05/2021   Hx of BKA, left ([place]) 10/05/2021   Edema of amputation stump of left lower extremity ([place]) 09/28/2021   Chronic pain disorder 09/28/2021   Amputation stump infection ([place])    Sepsis ([place]) 08/16/2021   Right foot pain 08/07/2021   Right lower lobe lung mass 08/06/2021   Hepatocellular carcinoma ([place]) 08/06/2021   Pressure injury of skin 07/05/2021   Right leg pain 07/04/2021   GI bleeding 07/04/2021   Acute on chronic diastolic CHF (congestive heart failure) ([place]) 06/14/2021   GERD without esophagitis 06/14/2021   Chest pain 06/13/2021   Diabetic foot infection ([place]) 04/14/2021   PAD (peripheral artery disease) ([place]) 04/08/2021   Gastritis and gastroduodenitis  ESRD (end stage renal disease) on dialysis ([place]) 03/25/2021   Diabetes mellitus type 2, controlled, with complications ([place]) 69/62/9528   Alcohol abuse with intoxication ([place]) 03/25/2021   Cocaine abuse ([place]) 03/25/2021   Tobacco use disorder 03/25/2021   Class 2 obesity due to excess calories with body mass index (BMI) of 39.0 to 39.9 in adult 03/25/2021   AVM (arteriovenous malformation) of duodenum, acquired    Peptic ulcer disease    Upper GI bleed 03/24/2021   Chalazion of right upper eyelid 03/24/2021   Thrombocytopenia ([place]) 03/24/2021   Visual hallucination 03/24/2021   Bilateral leg edema  03/24/2021   Benign neoplasm of duodenum, jejunum, and ileum    Anemia due to chronic kidney disease 02/23/2021   Rotator cuff tear arthropathy of right shoulder 01/23/2021   Alcohol dependence ([place]) 01/13/2021   Gastric ulcer with hemorrhage 01/13/2021   Generalized weakness    Transaminitis 01/10/2021   Alcohol abuse 10/27/2020   History of cocaine abuse ([place]) 10/27/2020   Acute upper GI bleed 08/10/2020   Duodenal ulcer with hemorrhage    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   AVM (arteriovenous malformation) of small bowel, acquired    Anemia due to GI blood loss 07/31/2019   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) ([place]) 07/11/2019   Iron deficiency anemia 03/30/2019   Alcohol use disorder 03/30/2019   B12 deficiency 03/30/2019   Orthostatic hypotension 01/22/2019   Essential hypertension 06/29/2016   PCP:  [name], NP Pharmacy:   [facility] [address] [place] [address] Phone: [phone] Fax: [phone]     Social Determinants of Health (SDOH) Interventions    Readmission Risk Interventions    10/10/2021   10:49 AM 02/27/2021   12:51 PM 07/07/2019    4:03 PM  Readmission Risk Prevention Plan  Transportation Screening Complete Complete Complete  PCP or Specialist Appt within 3-5 Days   Complete  HRI or [place]   Complete  Social Work Consult for [place] Planning/Counseling   Complete  Palliative Care Screening   Not Applicable  Medication Review Press photographer) Complete Complete Complete  PCP or Specialist appointment within 3-5 days of discharge Complete Complete   HRI or [place] Complete Complete   SW Recovery Care/Counseling Consult Complete Complete   Palliative Care Screening Complete Not [place] Patient Refused Not Applicable

## 2022-02-17 NOTE — Progress Notes (Addendum)
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, S/P Y90 ablation 11/28/2021), ongoing alcohol abuse, iron deficiency anemia, ESRD (HD MWF), history of subarachnoid hemorrhage (2019), peripheral vascular disease (S/P right SFA stent), status post left BKA, diastolic congestive heart failure, pulmonary hypertension, nicotine dependence (cigarettes), gastroesophageal reflux disease who presented to [facility] emergency department with complaints of abdominal pain and dark stools.  Upon evaluation the patient was felt to be suffering from a gastrointestinal bleed considering patient's complicated history of GI bleeding in the past.  Patient was found to have a hemoglobin of 6.6 with several additional episodes of melena noted in the emergency department.  2 unit packed red blood cell transfusion was ordered and orders were placed for the patient to be admitted to [facility].  10/22 transferred to [facility]  IR paracentesis 250 cc  10/23 HD   Assessment and Plan: * Acute upper GI bleed SP 2 units PRBC transfusion.  Patient with Hgb 7,3  If bleeding patient will need octreotide infusion  Patient has been tolerating po well.  Continue to follow up on Hgb Transitioned to oral pantoprazole with good toleration.   Acute on chronic diastolic CHF (congestive heart failure) (HCC) Positive ascites and edema Patient getting HD today with ultrafiltration, planned to remove 3 L.  Continue close follow up on blood pressure, his systolic has been 026 to 378 mmHg.   Acute cardiogenic pulmonary edema (HCC) Please see assessment and plan above  ESRD (end  stage renal disease) on dialysis (HCC) Hyponatremia and hypokalemia   K today at 3,3 and serum bicarbonate at 22, BUN is 49 and Na 128  Patient with hypervolemia, plan for HD today.   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   Ascites sp paracentesis 250 cc  Antibiotic therapy to prevent SBP ( 513 nucleated cells, with 24 lymphocytes, 71 monocytes, and 5 Neutrophils.  Continue with SBP prophylaxis in the setting of ascites and upper GI bleeding.   Patient with no signs of alcohol withdrawal continue close monitoring.  Continue with as needed alprazolam.  Plan for possible repeat paracentesis (more therapeutic than diagnostic).   Essential hypertension Continue blood pressure control with amlodipine   Mixed hyperlipidemia Continuing home regimen of lipid lowering therapy.         Subjective: Patient with no chest pain, continue to have dyspnea, abdominal distention and lower extremity edema,. Patient examined during HD   Physical Exam: Vitals:   02/17/22 1418 02/17/22 1431 02/17/22 1500 02/17/22 1530  BP: (!) 140/86 (!) 162/74 (!) 153/79 (!) 161/91  Pulse: 82 85 79 80  Resp: '11 13 18 13  '$ Temp:      TempSrc:      SpO2: 95% 97% 97% 96%  Weight:      Height:       Neurology awake and alert ENT with mild pallor Cardiovascular with S1 and S2 present and rhythmic with no gallops, rubs or murmurs Respiratory with scattered rales Abdomen with distention and dull to percussion, non tender to superficial palpation  Positive bilateral lower extremity edema  Data Reviewed:  Family Communication: no family at the bedside   Disposition: Status is: Inpatient Remains inpatient appropriate because: renal failure and GI bleeding   Planned Discharge Destination: Home      Author: Tawni Millers, MD 02/17/2022 3:41 PM  For on call review [email or web address].

## 2022-02-17 NOTE — Progress Notes (Addendum)
Daily Rounding Note  02/17/2022, 12:23 PM  LOS: 2 days   SUBJECTIVE:   Chief complaint:   Melena.  Blood loss anemia.  Abdominal pain.  Dry cough.  Feels short of breath.  Abdominal distention not much improved after 250 mL paracentesis yesterday.  Some upper mid abdominal discomfort.  No nausea or vomiting.  No BMs.  OBJECTIVE:         Vital signs in last 24 hours:    Temp:  [97.6 F (36.4 C)-98.4 F (36.9 C)] 98.4 F (36.9 C) (10/23 0332) Pulse Rate:  [86-90] 88 (10/23 0332) Resp:  [17-22] 17 (10/23 0800) BP: (138-159)/(74-104) 159/83 (10/23 0800) SpO2:  [95 %-98 %] 98 % (10/23 0332)   Filed Weights   02/14/22 2039  Weight: 108.7 kg   General: NAD.  Looks mildly uncomfortable.  No distress.  Looks somewhat chronically ill. Heart: RRR. Chest: Crackles bilaterally.  Dry paroxysmal cough. Abdomen: Soft, obese.  Nontender.  Bowel sounds active Extremities: No CCE. Neuro/Psych: Pleasant, cooperative.  Moves all 4 limbs.  No gross deficits or tremors.  Intake/Output from previous day: 10/22 0701 - 10/23 0700 In: 606 [I.V.:506; IV Piggyback:100] Out: 680 [Urine:680]  Intake/Output this shift: No intake/output data recorded.  Lab Results: Recent Labs    02/15/22 0409 02/15/22 1552 02/15/22 2029 02/16/22 0542 02/17/22 1048  WBC 3.9*  --   --  3.3* 3.0*  HGB 6.3*   < > 8.5* 7.4* 7.3*  HCT 19.0*   < > 24.1* 21.2* 20.6*  PLT 94*  --   --  80* 73*   < > = values in this interval not displayed.   BMET Recent Labs    02/15/22 0409 02/16/22 0542 02/17/22 0418  NA 131* 130* 128*  K 3.5 3.6 3.3*  CL 97* 92* 93*  CO2 '25 26 22  '$ GLUCOSE 99 73 97  BUN 38* 46* 49*  CREATININE 4.91* 6.29* 6.92*  CALCIUM 7.4* 8.5* 8.0*   LFT Recent Labs    02/14/22 2100 02/15/22 0409 02/16/22 0542  PROT 7.8 6.8 6.9  ALBUMIN 2.1* 1.9* 1.9*  AST 53* 52* 51*  ALT '17 17 14  '$ ALKPHOS 110 94 90  BILITOT 1.4* 1.4* 1.5*    PT/INR Recent Labs    02/14/22 2100  LABPROT 15.4*  INR 1.2   Hepatitis Panel Recent Labs    02/16/22 1655  HEPBSAG NON REACTIVE    Studies/Results: US Paracentesis  Result Date: 02/16/2022 INDICATION: Patient with history of ESRD presents with abdominal distension, CT abdomen pelvis without contrast yesterday showed moderate ascites. Request for therapeutic and diagnostic paracentesis. EXAM: ULTRASOUND GUIDED  PARACENTESIS MEDICATIONS: 10 mL 1% lidocaine COMPLICATIONS: None immediate. PROCEDURE: Informed written consent was obtained from the patient after a discussion of the risks, benefits and alternatives to treatment. A timeout was performed prior to the initiation of the procedure. Initial ultrasound scanning demonstrates a small amount of ascites within the left upper abdominal quadrant. The right lower abdomen was prepped and draped in the usual sterile fashion. 1% lidocaine was used for local anesthesia. Following this, a 19 gauge, 7-cm, Yueh catheter was introduced. An ultrasound image was saved for documentation purposes. The paracentesis was performed. The catheter was removed and a dressing was applied. The patient tolerated the procedure well without immediate post procedural complication. FINDINGS: A total of approximately 250 mL of hazy yellow fluid was removed. Samples were sent to the laboratory as requested by the clinical  team. IMPRESSION: Successful ultrasound-guided paracentesis yielding 250 mLof peritoneal fluid. Read by: Durenda Guthrie, PA-C Electronically Signed   By: Lucrezia Europe M.D.   On: 02/16/2022 13:23    Scheduled Meds:  sodium chloride   Intravenous Once   amLODipine  5 mg Oral Daily   atorvastatin  40 mg Oral QHS   Chlorhexidine Gluconate Cloth  6 each Topical D6644   folic acid  1 mg Oral Daily   multivitamin with minerals  1 tablet Oral Daily   thiamine  100 mg Oral Daily   Continuous Infusions:  cefTRIAXone (ROCEPHIN)  IV 2 g (02/17/22 0425)    pantoprazole 8 mg/hr (02/17/22 0427)   PRN Meds:.acetaminophen, alteplase, guaiFENesin-dextromethorphan, heparin, hydrALAZINE, HYDROmorphone (DILAUDID) injection, lidocaine (PF), lidocaine-prilocaine, LORazepam, oxyCODONE, pentafluoroprop-tetrafluoroeth, polyethylene glycol, prochlorperazine, traZODone  ASSESMENT:     Symptomatic anemia, recurrent acute on chronic anemia. Hgb 6.3.[name] 2 PRBCs.. 8.5.. 7.3.    Melenic stools.  Multiple EGDs, SBE, VCE's all confirm SB AVMs.  Octreotide outpatient was prescribed but insurance did not approve.  Talk of referral for double-balloon enteroscopy at [facility] but suspect even with intervention that AVMs will recur.  No plans to repeat EGD/SBE.  Cirrhosis. Hx multifocal HCCA.  Treated with MAA injection to right superior hepatic artery by Dr. Serafina Royals 10/2021 MELD 3.0: 27 at 02/16/2022   MELD-Na: 27 at 02/16/2022    Large volume ascites per CT. 250 mL paracentesis on 10/22. Fluid studies: 513 total nucleated cells, 5% are neutrophils (c/w total count around 25).  No SBP.       ESRD, on hemodialysis.  MWF.    Hyponatremia, sodium 128  Hypokalemia, potassium 3.3.   PLAN     Renal diet ordered    Stopped PPI gtt.  Place back on Pantoprazole 40 po bid  Plan to reach out to IR to asked them whether the paracentesis was done with diagnostic studies in mind or whether the 250 mL of fluid that was removed was all that they could obtain.  If there is additional, tappable ascites, needs additional orders for therapeutic paracentesis, removing up to 4 L.    Azucena Freed  02/17/2022, 12:23 PM Phone [phone]     [facility] Attending   I have taken an interval history, reviewed the chart and examined the patient. I agree with the Advanced Practitioner's note, impression and recommendations.   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 500 WBC meets criteria for infection/SBP thogh just barely at 513 WBC)  I will consult IR about additional and regular paracenteses  Gatha Mayer, MD, [facility] See [name] on call - gastroenterology for best contact person 02/17/2022 4:14 PM

## 2022-02-17 NOTE — Progress Notes (Addendum)
[facility] Progress Note  Name: Daniel [name] MRN: [id number] DOB: 03-Aug-1960   Subjective:  Admitted with GI bleed.  Per charting had 250 mL fluid off with paracentesis with IR on 10/22.  GI is here and is looking in to whether he can get additional fluid off and if there was an issue yesterday.  He states yesterday was first paracentesis.   Review of systems:  He has some shortness of breath Denies n/v - didn't eat any breakfast as is NPO He states no BM since admission  -------------------- Background on consult:  Daniel [name] is a 61 y.o. male with ESRD on HD MWF at [facility]. His past medical history is significant for recent diagnosis of hepatocellular carcinoma several months ago (not on treatment-follows with 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   Patient presented to [facility] ED c/o ABD distention and melena stools. He then was transferred here for ongoing management and hemodialysis needs. GI is following. S/p 2 units PRBCs at admit for Hgb 6.6. Hgb now 7.4. Per GI, clinical presentation not consistent with variceal bleed, thus emergent EGD is not indicated. Continue PPI and labs. On full liquid diet. Seen and examined patient at bedside. Seen sitting up in bed inquiring when can he eat. He denies SOB, CP, and N/V. Last HD on 10/20 where he received full treatment. CXR showed BL interstitial and airspace opacities and unchanged R pleural effusion. Noted IR consulted for paracentesis. Plan for HD 02/17/22 per his routine schedule.   Intake/Output Summary  (Last 24 hours) at 02/17/2022 1229 Last data filed at 02/17/2022 0600 Gross per 24 hour  Intake 605.99 ml  Output 680 ml  Net -74.01 ml    Vitals:  Vitals:   02/16/22 2000 02/16/22 2341 02/17/22 0332 02/17/22 0800  BP: (!) 140/96 (!) 146/74 138/74 (!) 159/83  Pulse: 89 90 88   Resp: (!) '22 20 18 17  '$ Temp: 97.8 F (36.6 C) 97.6 F (36.4 C) 98.4 F (36.9 C)   TempSrc: Oral Oral Oral   SpO2: 95% 96% 98%   Weight:      Height:         Physical Exam:  General adult male in bed in no acute distress HEENT normocephalic atraumatic extraocular movements intact sclera anicteric Neck supple trachea midline Lungs clear to auscultation bilaterally normal work of breathing at rest  Heart S1S2 no rub Abdomen soft nontender nondistended Extremities trace edema  Psych normal mood and affect Access - LUE AVF bruit and thrill   Medications reviewed   Labs:     Latest Ref Rng & Units 02/17/2022    4:18 AM 02/16/2022    5:42 AM 02/15/2022    4:09 AM  BMP  Glucose 70 - 99 mg/dL 97  73  99   BUN 8 - 23 mg/dL 49  46  38   Creatinine 0.61 - 1.24 mg/dL 6.92  6.29  4.91   Sodium 135 - 145 mmol/L 128  130  131   Potassium 3.5 - 5.1 mmol/L 3.3  3.6  3.5   Chloride 98 - 111 mmol/L 93  92  97   CO2 22 - 32 mmol/L 22  26  25   Calcium 8.9 - 10.3 mg/dL 8.0  8.5  7.4    Dialysis Orders:  MWF - [place] 4hrs, BFR 450, DFR 500,  EDW 98.8kg, 2K/ 2Ca NO HEPARIN NO ESA D/T ACTIVE CANCER  Assessment/Plan:   Acute on chronic anemia/Melena - had multiple GI procedures completed in the past. Tried treating with octreotide but not approved by his insurance. On PPI.  per primary team and GI.  ABD distention - s/p paracentesis with IR - only 250 mL charted as removed.  GI is looking into whether he is able to get a therapeutic paracentesis (unsure if there was no window) ESRD - on HD MWF Hypertension/volume  - optimize volume with HD Hyponatremia - secondary to cirrhosis; follow  with HD Anemia of CKD -  No ESA d/t active cancer Secondary Hyperparathyroidism - phos ok.   Disposition - per primary team   Claudia Desanctis, MD 02/17/2022 12:48 PM

## 2022-02-17 NOTE — Plan of Care (Signed)

## 2022-02-18 DIAGNOSIS — K922 Gastrointestinal hemorrhage, unspecified: Secondary | ICD-10-CM | POA: Diagnosis not present

## 2022-02-18 DIAGNOSIS — K7469 Other cirrhosis of liver: Secondary | ICD-10-CM | POA: Diagnosis not present

## 2022-02-18 DIAGNOSIS — B1920 Unspecified viral hepatitis C without hepatic coma: Secondary | ICD-10-CM | POA: Diagnosis not present

## 2022-02-18 DIAGNOSIS — I5033 Acute on chronic diastolic (congestive) heart failure: Secondary | ICD-10-CM | POA: Diagnosis not present

## 2022-02-18 LAB — CBC
HCT: 21.5 % — ABNORMAL LOW (ref 39.0–52.0)
Hemoglobin: 7.4 g/dL — ABNORMAL LOW (ref 13.0–17.0)
MCH: 32.2 pg (ref 26.0–34.0)
MCHC: 34.4 g/dL (ref 30.0–36.0)
MCV: 93.5 fL (ref 80.0–100.0)
Platelets: 41 10*3/uL — ABNORMAL LOW (ref 150–400)
RBC: 2.3 MIL/uL — ABNORMAL LOW (ref 4.22–5.81)
RDW: 17.2 % — ABNORMAL HIGH (ref 11.5–15.5)
WBC: 2.6 10*3/uL — ABNORMAL LOW (ref 4.0–10.5)
nRBC: 0 % (ref 0.0–0.2)

## 2022-02-18 LAB — BASIC METABOLIC PANEL
Anion gap: 10 (ref 5–15)
BUN: 22 mg/dL (ref 8–23)
CO2: 26 mmol/L (ref 22–32)
Calcium: 7.8 mg/dL — ABNORMAL LOW (ref 8.9–10.3)
Chloride: 92 mmol/L — ABNORMAL LOW (ref 98–111)
Creatinine, Ser: 4.82 mg/dL — ABNORMAL HIGH (ref 0.61–1.24)
GFR, Estimated: 13 mL/min — ABNORMAL LOW (ref >60)
Glucose, Bld: 83 mg/dL (ref 70–99)
Potassium: 3.3 mmol/L — ABNORMAL LOW (ref 3.5–5.1)
Sodium: 128 mmol/L — ABNORMAL LOW (ref 135–145)

## 2022-02-18 LAB — GLUCOSE, CAPILLARY: Glucose-Capillary: 87 mg/dL (ref 70–99)

## 2022-02-18 MED ORDER — TRAZODONE HCL 50 MG PO TABS
50.0000 mg | ORAL_TABLET | Freq: Every day | ORAL | Status: DC
  Administered 2022-02-18 – 2022-02-20 (×3): 50 mg via ORAL
  Filled 2022-02-18 (×3): qty 1

## 2022-02-18 MED ORDER — METOCLOPRAMIDE HCL 5 MG/ML IJ SOLN
10.0000 mg | Freq: Once | INTRAMUSCULAR | Status: AC
  Administered 2022-02-18: 10 mg via INTRAVENOUS
  Filled 2022-02-18: qty 2

## 2022-02-18 MED ORDER — CHLORHEXIDINE GLUCONATE CLOTH 2 % EX PADS: 6.0000 | MEDICATED_PAD | Freq: Every day | CUTANEOUS | Status: DC

## 2022-02-18 MED ORDER — IPRATROPIUM-ALBUTEROL 0.5-2.5 (3) MG/3ML IN SOLN
RESPIRATORY_TRACT | Status: AC
  Filled 2022-02-18: qty 3

## 2022-02-18 MED ORDER — IPRATROPIUM-ALBUTEROL 0.5-2.5 (3) MG/3ML IN SOLN
3.0000 mL | Freq: Four times a day (QID) | RESPIRATORY_TRACT | Status: DC | PRN
  Administered 2022-02-18 – 2022-02-19 (×4): 3 mL via RESPIRATORY_TRACT
  Filled 2022-02-18 (×3): qty 3

## 2022-02-18 NOTE — Progress Notes (Signed)
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 Burr [name], S/P Y90 ablation 11/28/2021), ongoing alcohol abuse, iron deficiency anemia, ESRD (HD MWF), history of subarachnoid hemorrhage (2019), peripheral vascular disease (S/P right SFA stent), status post left BKA, diastolic congestive heart failure, pulmonary hypertension, nicotine dependence (cigarettes), gastroesophageal reflux disease who presented to [facility] emergency department with complaints of abdominal pain and dark stools.  Upon evaluation the patient was felt to be suffering from a gastrointestinal bleed considering patient's complicated history of GI bleeding in the past.  Patient was found to have a hemoglobin of 6.6 with several additional episodes of melena noted in the emergency department.  2 unit packed red blood cell transfusion was ordered and orders were placed for the patient to be admitted to [facility].  10/22 transferred to [facility]  IR paracentesis 250 cc  10/23 HD   Assessment and Plan: * Acute upper GI bleed SP 2 units PRBC transfusion.  Follow up hgb is 7,4  No sings of active bleeding.   Holding for now octreotide infusion  Continue pantoprazole po   Patient had multiple EGDs SBE and VCEs confirming small bowel AVMS.  Continue medical therapy per GI recommendations.   Acute blood loss anemia due to upper GI bleed, chronic leukopenia and thrombocytopenia Pancytopenia Follow cell count closely   Acute on chronic diastolic CHF (congestive heart failure) (HCC) His fluid balance has improved, edema better today after HD yesterday He continue to have abdominal  distention and dyspnea.   Continue close blood pressure monitoring Fluid management per HD.   Acute cardiogenic pulmonary edema (HCC) Please see assessment and plan above  ESRD (end stage renal disease) on dialysis ([place]) Hyponatremia and hypokalemia   Continue to have hypokalemia with K at 3,3, serum bicarbonate is 26 and Na at 128 Plan to add 40 Kcl po today and follow up renal function in am.    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) ([place]) Hepatitis C, and history of hepatocellular carcinoma sp ablation in 11/2021.   Ascites sp paracentesis 250 cc  Fluid analysis with 513 nucleated cells, with 24 lymphocytes, 71 monocytes, and 5 Neutrophils.  Continue with SBP prophylaxis with ceftriaxone in the setting of ascites and upper GI bleeding.   Clinically continue to have ascites, will order repeat therapeutic paracentesis per IR.   Patient with no signs of alcohol withdrawal continue close monitoring.  Continue with as needed alprazolam.   Essential hypertension Continue blood pressure control with amlodipine   Mixed hyperlipidemia Continuing home regimen of lipid lowering therapy.         Subjective: Patient with no chest pain, edema has improved, but continue with dyspnea. Has difficulty sleeping   Physical Exam: Vitals:   02/17/22 2154 02/17/22 2323 02/18/22 0700 02/18/22 1237  BP: 137/75 (!) 141/79 (!) 156/51 128/61  Pulse: 79 81 80 85  Resp: '19 16 15 '$ (!) 21  Temp:  97.9 F (36.6 C) 97.7 F (36.5 C) 97.8 F (36.6 C)  TempSrc:  Oral Oral Oral  SpO2:  97% 100% 100%  Weight:      Height:       Neurology awake and alert ENT with mild  pallor Cardiovascular with S1 and S2 present and rhythmic Respiratory with no rales or wheezing Abdomen is distended and tender to superficial palpation, no rebound, dull to percussion Trace lower extremity edema  Data Reviewed:    Family Communication: no family at the bedside   Disposition: Status is:  Inpatient Remains inpatient appropriate because: GI bleed and ascites   Planned Discharge Destination: Home    Author: Tawni Millers, MD 02/18/2022 3:45 PM  For on call review [email or web address].

## 2022-02-18 NOTE — Plan of Care (Signed)

## 2022-02-18 NOTE — Progress Notes (Signed)
Daily Rounding Note  02/18/2022, 11:28 AM  LOS: 3 days   SUBJECTIVE:   Chief complaint:  Melena, blood loss anemia.  Abd pain.    Last bowel movement was a couple of days ago, it was black.  This morning he has had nausea and dry heaves. Got compazine at 0730.  Some epigastric pain/discomfort, this is not new.     OBJECTIVE:         Vital signs in last 24 hours:    Temp:  [97.7 F (36.5 C)-97.9 F (36.6 C)] 97.7 F (36.5 C) (10/24 0700) Pulse Rate:  [79-86] 80 (10/24 0700) Resp:  [11-22] 15 (10/24 0700) BP: (127-180)/(51-91) 156/51 (10/24 0700) SpO2:  [95 %-100 %] 100 % (10/24 0700)   Filed Weights   02/14/22 2039  Weight: 108.7 kg   General: NAD.   Comfortable.  No particularly ill looking   Heart: RRR Chest: no labored breathing or cough.   Abdomen: soft, ND.  Some epigastric tenderness but no G/R  Extremities: no CCE.  L BKA Neuro/Psych:  pleasant, no confusion, alert.  No tremor or gross deficits.  Speech clear.    Intake/Output from previous day: 10/23 0701 - 10/24 0700 In: 360 [P.O.:360] Out: 3800 [Urine:800]  Intake/Output this shift: No intake/output data recorded.  Lab Results: Recent Labs    02/16/22 0542 02/17/22 1048 02/18/22 1016  WBC 3.3* 3.0* 2.6*  HGB 7.4* 7.3* 7.4*  HCT 21.2* 20.6* 21.5*  PLT 80* 73* 41*   BMET Recent Labs    02/16/22 0542 02/17/22 0418 02/18/22 1016  NA 130* 128* 128*  K 3.6 3.3* 3.3*  CL 92* 93* 92*  CO2 '26 22 26  '$ GLUCOSE 73 97 83  BUN 46* 49* 22  CREATININE 6.29* 6.92* 4.82*  CALCIUM 8.5* 8.0* 7.8*   LFT Recent Labs    02/16/22 0542  PROT 6.9  ALBUMIN 1.9*  AST 51*  ALT 14  ALKPHOS 90  BILITOT 1.5*   PT/INR No results for input(s): "LABPROT", "INR" in the last 72 hours. Hepatitis Panel Recent Labs    02/16/22 1655  HEPBSAG NON REACTIVE    Studies/Results: US Abdomen Limited  Result Date: 02/17/2022 CLINICAL DATA:  History of  ascites EXAM: LIMITED ABDOMEN ULTRASOUND FOR ASCITES TECHNIQUE: Limited ultrasound survey for ascites was performed in all four abdominal quadrants. COMPARISON:  CT from 02/15/2022 FINDINGS: Moderate ascites is noted in all 4 quadrants. Persistent bilateral effusions are noted right greater than left similar to that seen on prior CT examination. IMPRESSION: Persistent moderate ascites in all 4 quadrants. Bilateral pleural effusions are noted. Electronically Signed   By: Inez Catalina M.D.   On: 02/17/2022 21:37   US Paracentesis  Result Date: 02/16/2022 INDICATION: Patient with history of ESRD presents with abdominal distension, CT abdomen pelvis without contrast yesterday showed moderate ascites. Request for therapeutic and diagnostic paracentesis. EXAM: ULTRASOUND GUIDED  PARACENTESIS MEDICATIONS: 10 mL 1% lidocaine COMPLICATIONS: None immediate. PROCEDURE: Informed written consent was obtained from the patient after a discussion of the risks, benefits and alternatives to treatment. A timeout was performed prior to the initiation of the procedure. Initial ultrasound scanning demonstrates a small amount of ascites within the left upper abdominal quadrant. The right lower abdomen was prepped and draped in the usual sterile fashion. 1% lidocaine was used for local anesthesia. Following this, a 19 gauge, 7-cm, Yueh catheter was introduced. An ultrasound image was saved for documentation purposes. The paracentesis was  performed. The catheter was removed and a dressing was applied. The patient tolerated the procedure well without immediate post procedural complication. FINDINGS: A total of approximately 250 mL of hazy yellow fluid was removed. Samples were sent to the laboratory as requested by the clinical team. IMPRESSION: Successful ultrasound-guided paracentesis yielding 250 mLof peritoneal fluid. Read by: Durenda Guthrie, PA-C Electronically Signed   By: Lucrezia Europe M.D.   On: 02/16/2022 13:23    Scheduled Meds:   sodium chloride   Intravenous Once   amLODipine  5 mg Oral Daily   atorvastatin  40 mg Oral QHS   Chlorhexidine Gluconate Cloth  6 each Topical G6269   folic acid  1 mg Oral Daily   multivitamin with minerals  1 tablet Oral Daily   pantoprazole  40 mg Oral BID   thiamine  100 mg Oral Daily   Continuous Infusions:  cefTRIAXone (ROCEPHIN)  IV 2 g (02/18/22 0555)   PRN Meds:.acetaminophen, guaiFENesin-dextromethorphan, hydrALAZINE, HYDROmorphone (DILAUDID) injection, LORazepam, oxyCODONE, polyethylene glycol, prochlorperazine, traZODone   ASSESMENT:     Symptomatic anemia, recurrent acute on chronic anemia. Hgb 6.3.[name] 2 PRBCs.. 8.5.. 7.3.. 7.4.    New onset nausea, dry heaves.  Got Compazine ~ 0730 this AM.     Pancytopenia.  Platelets declining,  now in 30s.     Melenic stools.  Multiple EGDs, SBE, VCE's all confirm SB AVMs.  Insurance would not cover outpt Octreotide. Talk of referral for double-balloon enteroscopy at [facility] but suspect even with intervention that AVMs will recur.  No plans to repeat EGD/SBE given the culprit lesions on VCE were beyond range of SBE scope.  Day 4 Rocephin for gib in cirrhotic.     Cirrhosis. Hx multifocal HCCA.  Y 75 RFA Dr. Serafina Royals 10/2021 MELD 3.0: 27 at 02/16/2022   MELD-Na: 27 at 02/16/2022     Large volume ascites per CT. 250 mL paracentesis on 10/22. Fluid studies: no SBP. Spoke w radiolgy PA, the ascites prior to tap was 765m at most.        ESRD.  HD MWF.     Hyponatremia.  Na 128.     Hypokalemia.  K 3.3.     PLAN    Ordered Reglan x 1, if helpful can add this prn.      SAzucena Freed 02/18/2022, 11:28 AM Phone [phone]

## 2022-02-18 NOTE — Care Management Important Message (Signed)
Important Message  Patient Details  Name: Daniel [name] MRN: [id number] Date of Birth: 1961-03-22   Medicare Important Message Given:  Yes     Jenisa Monty Montine Circle 02/18/2022, 3:47 PM

## 2022-02-18 NOTE — Progress Notes (Signed)
[facility] Progress Note  Name: Daniel [name] MRN: [id number] DOB: 03-09-1961   Subjective:  Last HD on 10/23 with 3 kg UF.  He hasn't had another paracentesis.  Feels really weak and asks about the plan for the dark stools.  HD went ok.   Review of systems:   Denies overt shortness of breath  Feels weak  He had nausea this am and almost vomited   -------------------- Background on consult:  Daniel [name] is a 61 y.o. male with ESRD on HD MWF at [facility]. His past medical history is significant for recent diagnosis of hepatocellular carcinoma several months ago (not on treatment-follows with 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   Patient presented to [facility] ED c/o ABD distention and melena stools. He then was transferred here for ongoing management and hemodialysis needs. GI is following. S/p 2 units PRBCs at admit for Hgb 6.6. Hgb now 7.4. Per GI, clinical presentation not consistent with variceal bleed, thus emergent EGD is not indicated. Continue PPI and labs. On full liquid diet. Seen and examined patient at bedside. Seen sitting up in bed inquiring when can he eat. He denies SOB, CP, and N/V. Last HD on 10/20 where he received full treatment. CXR showed BL interstitial and airspace opacities and unchanged R pleural effusion. Noted IR consulted for paracentesis. Plan for HD 02/17/22 per his routine schedule.   Intake/Output Summary (Last 24 hours) at 02/18/2022 1116 Last data filed at 02/18/2022 0500 Gross per 24 hour  Intake 360 ml  Output 3800 ml  Net  -3440 ml    Vitals:  Vitals:   02/17/22 1842 02/17/22 2154 02/17/22 2323 02/18/22 0700  BP: 137/76 137/75 (!) 141/79 (!) 156/51  Pulse: 79 79 81 80  Resp: '18 19 16 15  '$ Temp:   97.9 F (36.6 C) 97.7 F (36.5 C)  TempSrc:   Oral Oral  SpO2: 100%  97% 100%  Weight:      Height:         Physical Exam:   General adult male in bed in no acute distress HEENT normocephalic atraumatic extraocular movements intact sclera anicteric Neck supple trachea midline Lungs clear to auscultation bilaterally normal work of breathing at rest  Heart S1S2 no rub Abdomen soft nontender nondistended Extremities no edema RLE or left residual limb; left BKA Psych normal mood and affect Access - LUE AVF bruit and thrill   Medications reviewed   Labs:     Latest Ref Rng & Units 02/18/2022   10:16 AM 02/17/2022    4:18 AM 02/16/2022    5:42 AM  BMP  Glucose 70 - 99 mg/dL 83  97  73   BUN 8 - 23 mg/dL 22  49  46   Creatinine 0.61 - 1.24 mg/dL 4.82  6.92  6.29   Sodium 135 - 145 mmol/L 128  128  130   Potassium 3.5 - 5.1 mmol/L 3.3  3.3  3.6   Chloride 98 - 111 mmol/L 92  93  92   CO2 22 - 32 mmol/L '26  22  26   '$ Calcium 8.9 - 10.3 mg/dL 7.8  8.0  8.5    Dialysis Orders:  MWF - [place] 4hrs, BFR 450, DFR 500,  EDW 98.8kg, 2K/ 2Ca NO HEPARIN NO ESA D/T ACTIVE CANCER  Assessment/Plan:   Acute on chronic anemia/Melena - had multiple GI procedures completed in the past. Tried treating with octreotide but not approved by his insurance. On PPI.  per primary team and GI.  ABD distention - s/p paracentesis with IR - only 250 mL charted as removed.  Paracentesis per GI discretion  ESRD - on HD MWF Hypertension/volume  - optimize volume with HD Hyponatremia - secondary to cirrhosis; follow with HD Anemia of CKD -  No ESA d/t active cancer Secondary Hyperparathyroidism - phos controlled   Disposition - per primary team   Claudia Desanctis, MD 02/18/2022 11:27 AM

## 2022-02-19 ENCOUNTER — Inpatient Hospital Stay (HOSPITAL_COMMUNITY): Payer: Medicare Other

## 2022-02-19 DIAGNOSIS — R188 Other ascites: Secondary | ICD-10-CM | POA: Diagnosis not present

## 2022-02-19 DIAGNOSIS — K922 Gastrointestinal hemorrhage, unspecified: Secondary | ICD-10-CM | POA: Diagnosis not present

## 2022-02-19 LAB — RESPIRATORY PANEL BY PCR

## 2022-02-19 LAB — RENAL FUNCTION PANEL
Albumin: 1.9 g/dL — ABNORMAL LOW (ref 3.5–5.0)
Anion gap: 16 — ABNORMAL HIGH (ref 5–15)
BUN: 27 mg/dL — ABNORMAL HIGH (ref 8–23)
CO2: 24 mmol/L (ref 22–32)
Calcium: 8.3 mg/dL — ABNORMAL LOW (ref 8.9–10.3)
Chloride: 90 mmol/L — ABNORMAL LOW (ref 98–111)
Creatinine, Ser: 6.07 mg/dL — ABNORMAL HIGH (ref 0.61–1.24)
GFR, Estimated: 10 mL/min — ABNORMAL LOW (ref >60)
Glucose, Bld: 113 mg/dL — ABNORMAL HIGH (ref 70–99)
Phosphorus: 4.4 mg/dL (ref 2.5–4.6)
Potassium: 3.2 mmol/L — ABNORMAL LOW (ref 3.5–5.1)
Sodium: 130 mmol/L — ABNORMAL LOW (ref 135–145)

## 2022-02-19 LAB — BASIC METABOLIC PANEL
Anion gap: 13 (ref 5–15)
BUN: 26 mg/dL — ABNORMAL HIGH (ref 8–23)
CO2: 25 mmol/L (ref 22–32)
Calcium: 8.3 mg/dL — ABNORMAL LOW (ref 8.9–10.3)
Chloride: 92 mmol/L — ABNORMAL LOW (ref 98–111)
Creatinine, Ser: 5.93 mg/dL — ABNORMAL HIGH (ref 0.61–1.24)
GFR, Estimated: 10 mL/min — ABNORMAL LOW (ref >60)
Glucose, Bld: 89 mg/dL (ref 70–99)
Potassium: 3.1 mmol/L — ABNORMAL LOW (ref 3.5–5.1)
Sodium: 130 mmol/L — ABNORMAL LOW (ref 135–145)

## 2022-02-19 LAB — CBC
HCT: 22.5 % — ABNORMAL LOW (ref 39.0–52.0)
Hemoglobin: 7.7 g/dL — ABNORMAL LOW (ref 13.0–17.0)
MCH: 32.8 pg (ref 26.0–34.0)
MCHC: 34.2 g/dL (ref 30.0–36.0)
MCV: 95.7 fL (ref 80.0–100.0)
Platelets: 70 10*3/uL — ABNORMAL LOW (ref 150–400)
RBC: 2.35 MIL/uL — ABNORMAL LOW (ref 4.22–5.81)
RDW: 17.3 % — ABNORMAL HIGH (ref 11.5–15.5)
WBC: 2.8 10*3/uL — ABNORMAL LOW (ref 4.0–10.5)
nRBC: 0 % (ref 0.0–0.2)

## 2022-02-19 LAB — HEPATITIS B SURFACE ANTIBODY, QUANTITATIVE: Hep B S AB Quant (Post): 42.4 m[IU]/mL

## 2022-02-19 MED ORDER — BENZONATATE 100 MG PO CAPS
200.0000 mg | ORAL_CAPSULE | Freq: Two times a day (BID) | ORAL | Status: DC | PRN
  Administered 2022-02-19 – 2022-02-20 (×2): 200 mg via ORAL
  Filled 2022-02-19 (×2): qty 2

## 2022-02-19 NOTE — Progress Notes (Signed)
  Progress Note   Patient: Daniel [name] NTZ:[id number] DOB: 06/12/1960 DOA: 02/14/2022     4 DOS: the patient was seen and examined on 02/19/2022  at 11:00AM on dialysis      Brief hospital course: Is a 61 year old M with ESRD on HD MWF, history hep C cirrhosis with varices, GAVE, hepatocellular carcinoma, and ascites, iron deficiency anemia, history of SAH, PVD, dCHF and prior left BKA who presented with abdominal pain and dark stools hemoglobin 6.6.  Please see prior summary from Dr. Cathlean Sauer   Assessment and Plan: * Acute upper GI bleed Please see prior summary from Dr. Cathlean Sauer - Continue PPI - Consult GI  Ascites -Ultrasound paracentesis ordered  Cirrhosis Underwent paracentesis earlier this hospitalization with elevated nucleated cell count, treated with 5 days Rocephin - Continue Rocephin 1 more day - Repeat paracentesis ordered  Acute on chronic diastolic CHF (congestive heart failure) ([place]) Appears euvolemic - HD per nephrology  Acute cardiogenic pulmonary edema (HCC) Resolved  ESRD (end stage renal disease) on dialysis [facility]) -Consult nephrology for HD  Essential hypertension - Continue amloidpine  Mixed hyperlipidemia -Continue atorvastatin  Alcohol use disorder No evidence of withdrawal -Continue folate, thiamine  Hypokalemia - Management with HD  Hyponatremia - Managemetn per HD  Sore throat - Check RVP      Subjective: Patient has had no bowel movements in the last 24 hours, he feels overall tired and like his belly is swollen.  Says he "cannot kick this cold", still has sniffles and sore throat     Physical Exam: BP (!) 153/48   Pulse 85   Temp 97.6 F (36.4 C) (Oral)   Resp 17   Ht '5\' 11"'$  (1.803 m)   Wt 103.3 kg   SpO2 100%   BMI 31.76 kg/m   Thin adult male, sitting in dialysis bed, interactive and appropriate, drinking water RRR, soft systolic murmur, no peripheral edema, no JVD Respiratory rate normal, lungs clear  without rales or wheezes Abdomen slightly distended, hard to tell if this is gas or ascites, soft, minimally tender, no rigidity or guarding Attention normal, affect appropriate, judgment insight appear normal    Data Reviewed: Gastroenterology notes reviewed Basic metabolic panel shows hyponatremia, hypokalemia, chronic renal dysfunction Hemogram shows hemoglobin stable, trending up to 7.7, chronic thrombocytopenia       Disposition: Status is: Inpatient The patient was admitted for GI bleed, his hemoglobin is trended up slightly with transfusion  GI have signed off, if he is able to obtain paracentesis tomorrow, possibly home        Author: Edwin Dada, MD 02/19/2022 7:31 PM  For on call review [email or web address].

## 2022-02-19 NOTE — Progress Notes (Signed)
[facility] Progress Note  Name: Daniel [name] MRN: [id number] DOB: 1961/03/05   Subjective:  Seen and examined on dialysis.  Blood pressure 141/93 and HR 82.  Procedure supervised. Tolerating goal.  Left AVF in use.  He's on 2 liters oxygen per his request  Review of systems:   He reports shortness of breath  Feels weak  Hx nausea  States no BM since admission  -------------------- Background on consult:  Daniel [name] is a 61 y.o. male with ESRD on HD MWF at [facility]. His past medical history is significant for recent diagnosis of hepatocellular carcinoma several months ago (not on treatment-follows with 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   Patient presented to [facility] ED c/o ABD distention and melena stools. He then was transferred here for ongoing management and hemodialysis needs. GI is following. S/p 2 units PRBCs at admit for Hgb 6.6. Hgb now 7.4. Per GI, clinical presentation not consistent with variceal bleed, thus emergent EGD is not indicated. Continue PPI and labs. On full liquid diet. Seen and examined patient at bedside. Seen sitting up in bed inquiring when can he eat. He denies SOB, CP, and N/V. Last HD on 10/20 where he received full treatment. CXR showed BL interstitial and airspace opacities and unchanged R pleural effusion. Noted IR consulted for paracentesis. Plan for HD 02/17/22 per his routine schedule.   Intake/Output Summary (Last 24 hours) at 02/19/2022 1130 Last data filed at 02/18/2022 2000 Gross per 24 hour  Intake 120 ml  Output  350 ml  Net -230 ml    Vitals:  Vitals:   02/19/22 1005 02/19/22 1035 02/19/22 1100 02/19/22 1120  BP: (!) 147/89 (!) 146/85 (!) 175/91   Pulse: 83 81 86 82  Resp: '16 20 17 18  '$ Temp:      TempSrc:      SpO2: 99% 100% 100% 100%  Weight:      Height:         Physical Exam:    General adult male in bed in no acute distress HEENT normocephalic atraumatic extraocular movements intact sclera anicteric Neck supple trachea midline Lungs clear to auscultation bilaterally normal work of breathing at rest; on 2 liters oxygen Heart S1S2 no rub Abdomen soft nontender nondistended Extremities no edema RLE or left residual limb; left BKA Psych normal mood and affect Neuro alert and oriented x 3 provides hx and follows commands Access - LUE AVF in use  Medications reviewed   Labs:     Latest Ref Rng & Units 02/19/2022   10:03 AM 02/19/2022    7:41 AM 02/18/2022   10:16 AM  BMP  Glucose 70 - 99 mg/dL 113  89  83   BUN 8 - 23 mg/dL '27  26  22   '$ Creatinine 0.61 - 1.24 mg/dL 6.07  5.93  4.82   Sodium 135 - 145 mmol/L 130  130  128   Potassium 3.5 - 5.1 mmol/L 3.2  3.1  3.3   Chloride 98 - 111 mmol/L 90  92  92   CO2 22 - 32 mmol/L '24  25  26   '$ Calcium 8.9 - 10.3 mg/dL 8.3  8.3  7.8  Dialysis Orders:  MWF - [place] 4hrs, BFR 450, DFR 500,  EDW 98.8kg, 2K/ 2Ca NO HEPARIN NO ESA D/T ACTIVE CANCER  Assessment/Plan:   Acute on chronic anemia/Melena - had multiple GI procedures completed in the past. Tried treating with octreotide but not approved by his insurance. On PPI.  per primary team and GI.  ABD distention - s/p paracentesis with IR - only 250 mL charted as removed.  Paracentesis per GI discretion - see than another is ordered ESRD - on HD MWF Hypertension/volume  - optimize volume with HD Hyponatremia - secondary to cirrhosis; follow with HD Anemia of CKD -  No ESA d/t active cancer Secondary Hyperparathyroidism - phos controlled   Disposition - per  primary team   Claudia Desanctis, MD 02/19/2022 11:45 AM

## 2022-02-19 NOTE — Progress Notes (Signed)
Patient Name: Daniel [name] Date of Encounter: 02/19/2022, 11:21 AM    Subjective  In dialysis, no c/o. Abdomen "a little swollen"   Objective  BP (!) 175/91   Pulse 86   Temp 97.6 F (36.4 C) (Oral)   Resp 17   Ht '5\' 11"'$  (1.803 m)   Wt 109.3 kg   SpO2 100%   BMI 33.61 kg/m  Chronically ill bm NAD Abd protuberant and distended - exam suggests at least some ascites     Latest Ref Rng & Units 02/19/2022    7:41 AM 02/18/2022   10:16 AM 02/17/2022   10:48 AM  CBC  WBC 4.0 - 10.5 K/uL 2.8  2.6  3.0   Hemoglobin 13.0 - 17.0 g/dL 7.7  7.4  7.3   Hematocrit 39.0 - 52.0 % 22.5  21.5  20.6   Platelets 150 - 400 K/uL 70  41  73      Recent Labs  Lab 02/15/22 0409 02/16/22 0542 02/17/22 0418 02/18/22 1016 02/19/22 0741  NA 131* 130* 128* 128* 130*  K 3.5 3.6 3.3* 3.3* 3.1*  CL 97* 92* 93* 92* 92*  CO2 '25 26 22 26 25  '$ GLUCOSE 99 73 97 83 89  BUN 38* 46* 49* 22 26*  CREATININE 4.91* 6.29* 6.92* 4.82* 5.93*  CALCIUM 7.4* 8.5* 8.0* 7.8* 8.3*  MG 1.8 1.9  --   --   --   PHOS 3.3  --  4.7*  --   --    Recent Labs  Lab 02/14/22 2100 02/15/22 0409 02/16/22 0542  AST 53* 52* 51*  ALT '17 17 14  '$ ALKPHOS 110 94 90  BILITOT 1.4* 1.4* 1.5*  PROT 7.8 6.8 6.9  ALBUMIN 2.1* 1.9* 1.9*  INR 1.2  --   --        Assessment and Plan     Symptomatic anemia, recurrent acute on chronic anemia. Hgb 6.3.[name] 2 PRBCs.. 8.5.. 7.3.. 7.4. 7.7     Pancytopenia.      Melenic stools.  Multiple EGDs, SBE, VCE's all confirm SB AVMs.  Insurance would not cover outpt Octreotide. Talk of referral for double-balloon enteroscopy at [facility] but suspect even with intervention that AVMs will recur.  No plans to repeat EGD/SBE given the culprit lesions on VCE were beyond range of SBE scope.  Day 5 Rocephin for gib in cirrhotic.     Cirrhosis. Hx multifocal HCC.  Y 60 RFA Dr. Serafina Royals 10/2021 MELD 3.0: 27 at 02/16/2022   MELD-Na: 27 at 02/16/2022     Moderate to large volume ascites per  CT. 250 mL paracentesis on 10/22. Fluid studies: no SBP. Spoke w radiolgy PA, the ascites prior to tap was 736m at most.        ESRD.  HD MWF.     ---------------------------------------------------------------------------------------------------------------------------  Hgb stable and bleeding appears resolved   ? Of how much ascites - starting to think CT finidings were over-estimated - looked at scan and a sig part of abdominal protuberance is obesity - agree w/ another [place] [facility] May stop [facility] after today  Looks to be achieving maximal hospital benefit and close to dc I think  He has f/u [facility] 11/9 - JEllouise Newer PA-C  Signing off  CGatha Mayer MD, FEn[facility]anGastroenterology See AShea Evanson call - gastroenterology for best contact person 02/19/2022 11:28 AM      CGatha Mayer MD, FNorth Canyon Medical CenterGastroenterology See AShea Evans  on call - gastroenterology for best contact person 02/19/2022 11:21 AM

## 2022-02-19 NOTE — Progress Notes (Signed)
Received patient in bed to unit.  Alert and oriented.  Informed consent signed and in chart.   Treatment initiated: 1005 Treatment completed: 1344  Patient tolerated well.  Transported back to the room  Alert, without acute distress.  Hand-off given to patient's nurse.   Access used: fistula Access issues: none  Total UF removed: 2500 Medication(s) given: dilauded 0.'5mg'$  Post HD VS: 97.6, 146/63(84), HR-85, RR-20, SP02-100 Post HD weight: 103.3kg   [facility]

## 2022-02-20 ENCOUNTER — Inpatient Hospital Stay (HOSPITAL_COMMUNITY): Payer: Medicare Other

## 2022-02-20 ENCOUNTER — Other Ambulatory Visit: Payer: Self-pay

## 2022-02-20 DIAGNOSIS — K922 Gastrointestinal hemorrhage, unspecified: Secondary | ICD-10-CM | POA: Diagnosis not present

## 2022-02-20 HISTORY — PX: IR PARACENTESIS: IMG2679

## 2022-02-20 LAB — BASIC METABOLIC PANEL
Anion gap: 9 (ref 5–15)
BUN: 19 mg/dL (ref 8–23)
CO2: 28 mmol/L (ref 22–32)
Calcium: 8.1 mg/dL — ABNORMAL LOW (ref 8.9–10.3)
Chloride: 96 mmol/L — ABNORMAL LOW (ref 98–111)
Creatinine, Ser: 4.89 mg/dL — ABNORMAL HIGH (ref 0.61–1.24)
GFR, Estimated: 13 mL/min — ABNORMAL LOW (ref >60)
Glucose, Bld: 122 mg/dL — ABNORMAL HIGH (ref 70–99)
Potassium: 3.4 mmol/L — ABNORMAL LOW (ref 3.5–5.1)
Sodium: 133 mmol/L — ABNORMAL LOW (ref 135–145)

## 2022-02-20 LAB — CBC
HCT: 21.2 % — ABNORMAL LOW (ref 39.0–52.0)
Hemoglobin: 7.4 g/dL — ABNORMAL LOW (ref 13.0–17.0)
MCH: 33.3 pg (ref 26.0–34.0)
MCHC: 34.9 g/dL (ref 30.0–36.0)
MCV: 95.5 fL (ref 80.0–100.0)
Platelets: 68 10*3/uL — ABNORMAL LOW (ref 150–400)
RBC: 2.22 MIL/uL — ABNORMAL LOW (ref 4.22–5.81)
RDW: 17.4 % — ABNORMAL HIGH (ref 11.5–15.5)
WBC: 2.6 10*3/uL — ABNORMAL LOW (ref 4.0–10.5)
nRBC: 0 % (ref 0.0–0.2)

## 2022-02-20 LAB — PATHOLOGIST SMEAR REVIEW

## 2022-02-20 MED ORDER — CHLORHEXIDINE GLUCONATE CLOTH 2 % EX PADS: 6.0000 | MEDICATED_PAD | Freq: Every day | CUTANEOUS | Status: DC

## 2022-02-20 MED ORDER — POLYETHYLENE GLYCOL 3350 17 G PO PACK
17.0000 g | PACK | Freq: Every day | ORAL | Status: DC
  Administered 2022-02-20 – 2022-02-21 (×2): 17 g via ORAL
  Filled 2022-02-20 (×2): qty 1

## 2022-02-20 NOTE — Progress Notes (Signed)
[facility] Progress Note  Name: Daniel [name] MRN: [id number] DOB: 1961/03/12   Subjective:  He had a paracentesis this am with 2.2 liters fluid removed.  Last HD on 10/25 with 2.5 kg UF. He hasn't had a huge amount of relief with the paracentesis.  No dark stools or blood per rectum since admission but he also hasn't had a BM since admission as below.    Review of systems:     He reports shortness of breath  Feels weak  Hx nausea  States no BM since admission - still.  He has been ordered PRN miralax but this hasn't been given   -------------------- Background on consult:  Daniel [name] is a 61 y.o. male with ESRD on HD MWF at [facility]. His past medical history is significant for recent diagnosis of hepatocellular carcinoma several months ago (not on treatment-follows with 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   Patient presented to [facility] ED c/o ABD distention and melena stools. He then was transferred here for ongoing management and hemodialysis needs. GI is following. S/p 2 units PRBCs at admit for Hgb 6.6. Hgb now 7.4. Per GI, clinical presentation not consistent with variceal bleed, thus emergent EGD is not indicated. Continue PPI and labs. On full liquid diet. Seen and examined patient at bedside. Seen sitting up in bed inquiring when can he eat. He denies SOB, CP, and N/V. Last HD on 10/20 where he received full treatment. CXR showed BL interstitial and airspace opacities and unchanged R pleural effusion. Noted IR consulted for paracentesis. Plan for HD  02/17/22 per his routine schedule.   Intake/Output Summary (Last 24 hours) at 02/20/2022 1253 Last data filed at 02/19/2022 1400 Gross per 24 hour  Intake --  Output 2500 ml  Net -2500 ml    Vitals:  Vitals:   02/20/22 0429 02/20/22 0500 02/20/22 0802 02/20/22 1245  BP: 138/87  (!) 147/87 (!) 141/96  Pulse:   86 75  Resp: '15  20 16  '$ Temp: (!) 97.3 F (36.3 C)  98 F (36.7 C) 98 F (36.7 C)  TempSrc: Oral  Oral Oral  SpO2: 96%     Weight:  103.8 kg    Height:         Physical Exam:     General adult male in bed in no acute distress HEENT normocephalic atraumatic extraocular movements intact sclera anicteric Neck supple trachea midline Lungs clear to auscultation bilaterally normal work of breathing at rest; on room air  Heart S1S2 no rub Abdomen soft nontender nondistended Extremities no edema RLE or left residual limb; left BKA Psych normal mood and affect Neuro alert and oriented x 3 provides hx and follows commands Access - LUE AVF with bruit and thrill   Medications reviewed   Labs:     Latest Ref Rng & Units 02/20/2022    5:50 AM 02/19/2022   10:03 AM 02/19/2022    7:41 AM  BMP  Glucose 70 - 99 mg/dL 122  113  89   BUN 8 - 23 mg/dL '19  27  26   '$ Creatinine 0.61 - 1.24 mg/dL 4.89  6.07  5.93   Sodium 135 - 145  mmol/L 133  130  130   Potassium 3.5 - 5.1 mmol/L 3.4  3.2  3.1   Chloride 98 - 111 mmol/L 96  90  92   CO2 22 - 32 mmol/L '28  24  25   '$ Calcium 8.9 - 10.3 mg/dL 8.1  8.3  8.3    Dialysis Orders:  MWF - [facility] 4hrs, BFR 450, DFR 500,  EDW 98.8kg, 2K/ 2Ca NO HEPARIN NO ESA D/T ACTIVE CANCER  Assessment/Plan:   Acute on chronic anemia/Melena - had multiple GI procedures completed in the past. Tried treating with octreotide but not approved by his insurance per charting. On PPI.  per primary team and GI.  ABD distention - s/p paracentesis with IR - only 250 mL charted as removed.  Paracentesis per GI discretion - he had one  today ESRD - on HD MWF Hypertension/volume  - optimize volume with HD Hyponatremia - secondary to cirrhosis; improved with HD Anemia of CKD -  No ESA d/t active cancer Secondary Hyperparathyroidism - phos controlled  Cirrhosis - per GI and primary team.  Paracentesis as above  Thrombocytopenia secondary to cirrhosis - stable   Disposition - per primary team   Claudia Desanctis, MD 02/20/2022 1:09 PM

## 2022-02-20 NOTE — Progress Notes (Signed)
Patient refused AM CHG bath.

## 2022-02-20 NOTE — Progress Notes (Signed)
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, S/P Y90 ablation 11/28/2021), ongoing alcohol abuse, iron deficiency anemia, ESRD (HD MWF), history of subarachnoid hemorrhage (2019), peripheral vascular disease (S/P right SFA stent), status post left BKA, diastolic congestive heart failure, pulmonary hypertension, nicotine dependence (cigarettes), gastroesophageal reflux disease who presented to [facility] emergency department with complaints of abdominal pain and dark stools. Upon evaluation the patient was felt to be suffering from a gastrointestinal bleed considering patient's complicated history of GI bleeding in the past.  Patient was found to have a hemoglobin of 6.6 with several additional episodes of melena noted in the ED- 2 unit packed red blood cell transfusion was ordered and 10/22 transferred to [facility] managed for acute upper GI bleed ascites with liver cirrhosis secondary to chronic diastolic CHF. S/p IR paracentesis 250 cc seen by nephro and had HD 10/23 Again repeat paracentesis 2.2 L 10/25.     Subjective: Seen and examined this morning.  Alert awake, pleasant complains of abdominal discomfort  but mostly at the left abdomen however he just had a paracentesis this morning Overnight afebrile BP 140s Labs with K3.4 hemoglobin 7.4 trending down from 7.7   Assessment and Plan: Principal Problem:   Acute upper GI bleed Active Problems:   Acute on chronic diastolic CHF (congestive heart failure) (HCC)   ESRD (end stage renal disease) on dialysis ([place])   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) (HCC)   Essential hypertension   Mixed hyperlipidemia    Chronic GI bleeding   Decompensated HCV cirrhosis (HCC)   Other ascites   Melanotic stool w/  acute upper GI bleeding: Multiple EGDs, SPE, VCE's-all confirmed SB AVMs.  GI input appreciated, status post PRBC transfusion hemoglobin stable but trending down.  Insurance would not cover outpatient octreotide, GI mention about referral for double-balloon enteroscopy at [facility] but suspect even with intervention AVM will recur.  No plan for EGD/SBE given no culprit lesion on VCE will be on range of SP, complete antibiotic ceftriaxone today.  Continue PPI daily.  Symptomatic anemia with recurrent acute on chronic anemia.  Status post transfusions hemoglobin stable but slowly downtrending recheck in the morning Recent Labs  Lab 02/16/22 0542 02/17/22 1048 02/18/22 1016 02/19/22 0741 02/20/22 0550  HGB 7.4* 7.3* 7.4* 7.7* 7.4*  HCT 21.2* 20.6* 21.5* 22.5* 21.2*   Liver cirrhosis Moderate to large volume ascites per CT: question overestimation on CT, 250 cc paracentesis 10/22, today 2.2 L.He has a follow-up with [facility] 11/19.  Hypertension on amlodipine 5 mg. HLD on Lipitor 40. Acute on chronic diastolic CHF/acute pulmonary edema- resolved at this time euvolemic, fluid being managed by nephrology  ESRD on HD M/WF Anemia of CKD: No ESA per nehro Secondary hyperparathyroidism: Phos controlled: Nephrology managing dialysis.  Leukopenia/thrombocytopenia in the setting of liver cirrhosis.  Monitor CBC platelet.  Alcohol use disorder: No evidence of withdrawal on folate thiamine trazodone. Hypokalemia per nephrology with HD Hyponatremia Per nephrology obesity  Sore throat:RVP panel negative, supportive care   Class I Obesity:Body mass index is 31.92 kg/m. :will benefit with PCP follow-up, weight loss  healthy lifestyle and outpatient sleep evaluation.  DVT prophylaxis: SCDs Start: 02/15/22 0233 Code Status:   Code Status: Full Code Family Communication:  plan of care discussed with patient  at bedside. Patient status is: inpatient  because of gi bleed Level of care: Med-Surg   Dispo: The patient is from: home            Anticipated disposition: home tomorrow  Mobility Assessment (last 72 hours)     Mobility Assessment     Row Name 02/20/22 0800 02/19/22 1930 02/19/22 0759 02/18/22 0830 02/17/22 2142   Does patient have an order for bedrest or is patient medically unstable No - Continue assessment No - Continue assessment No - Continue assessment No - Continue assessment No - Continue assessment   What is the highest level of mobility based on the progressive mobility assessment? Level 2 (Chairfast) - Balance while sitting on edge of bed and cannot stand Level 2 (Chairfast) - Balance while sitting on edge of bed and cannot stand Level 2 (Chairfast) - Balance while sitting on edge of bed and cannot stand Level 2 (Chairfast) - Balance while sitting on edge of bed and cannot stand Level 2 (Chairfast) - Balance while sitting on edge of bed and cannot stand   Is the above level different from baseline mobility prior to current illness? Yes - Recommend PT order Yes - Recommend PT order Yes - Recommend PT order Yes - Recommend PT order Yes - Recommend PT order             Objective: Vitals last 24 hrs: Vitals:   02/20/22 0429 02/20/22 0500 02/20/22 0802 02/20/22 1245  BP: 138/87  (!) 147/87 (!) 141/96  Pulse:   86 75  Resp: '15  20 16  '$ Temp: (!) 97.3 F (36.3 C)  98 F (36.7 C) 98 F (36.7 C)  TempSrc: Oral  Oral Oral  SpO2: 96%     Weight:  103.8 kg    Height:       Weight change: -6 kg  Physical Examination: General exam: alert awake, older than stated age HEENT:Oral mucosa moist, Ear/Nose WNL grossly Respiratory system: bilaterally clear BS, no use of accessory muscle Cardiovascular system: S1 & S2 +, No JVD. Gastrointestinal system: Abdomen soft,NT,ND, BS+ Nervous System:Alert, awake, moving extremities. Extremities: LE edema neg, lt BKA Skin: No rashes,no  icterus. MSK: Normal muscle bulk,tone, power  Medications reviewed:  Scheduled Meds:  sodium chloride   Intravenous Once   amLODipine  5 mg Oral Daily   atorvastatin  40 mg Oral QHS   Chlorhexidine Gluconate Cloth  6 each Topical [id number]   folic acid  1 mg Oral Daily   multivitamin with minerals  1 tablet Oral Daily   pantoprazole  40 mg Oral BID   polyethylene glycol  17 g Oral Daily   thiamine  100 mg Oral Daily   traZODone  50 mg Oral QHS   Diet Order             Diet Heart Room service appropriate? Yes; Fluid consistency: Thin  Diet effective now                  Intake/Output Summary (Last 24 hours) at 02/20/2022 1320 Last data filed at 02/19/2022 1400 Gross per 24 hour  Intake --  Output 2500 ml  Net -2500 ml   Net IO Since Admission: -5,465.68 mL [02/20/22 1320]  Wt Readings from Last 3 Encounters:  02/20/22 103.8 kg  01/31/22 108.9 kg  01/30/22 108.9 kg     Unresulted Labs (From admission, onward)    None     Data  Reviewed: I have personally reviewed following labs and imaging studies CBC: Recent Labs  Lab 02/14/22 2100 02/15/22 0235 02/15/22 0409 02/15/22 1552 02/16/22 0542 02/17/22 1048 02/18/22 1016 02/19/22 0741 02/20/22 0550  WBC 3.9*  --  3.9*  --  3.3* 3.0* 2.6* 2.8* 2.6*  NEUTROABS 2.6  --  2.0  --  1.9  --   --   --   --   HGB 7.6*   < > 6.3*   < > 7.4* 7.3* 7.4* 7.7* 7.4*  HCT 22.4*   < > 19.0*   < > 21.2* 20.6* 21.5* 22.5* 21.2*  MCV 98.2  --  100.5*  --  92.6 92.4 93.5 95.7 95.5  PLT 101*  --  94*  --  80* 73* 41* 70* 68*   < > = values in this interval not displayed.   Basic Metabolic Panel: Recent Labs  Lab 02/15/22 0409 02/16/22 0542 02/17/22 0418 02/18/22 1016 02/19/22 0741 02/19/22 1003 02/20/22 0550  NA 131* 130* 128* 128* 130* 130* 133*  K 3.5 3.6 3.3* 3.3* 3.1* 3.2* 3.4*  CL 97* 92* 93* 92* 92* 90* 96*  CO2 '25 26 22 26 25 24 28  '$ GLUCOSE 99 73 97 83 89 113* 122*  BUN 38* 46* 49* 22 26* 27* 19  CREATININE 4.91*  6.29* 6.92* 4.82* 5.93* 6.07* 4.89*  CALCIUM 7.4* 8.5* 8.0* 7.8* 8.3* 8.3* 8.1*  MG 1.8 1.9  --   --   --   --   --   PHOS 3.3  --  4.7*  --   --  4.4  --    GFR: Estimated Creatinine Clearance: 19.2 mL/min (A) (by C-G formula based on SCr of 4.89 mg/dL (H)). Liver Function Tests: Recent Labs  Lab 02/14/22 2100 02/15/22 0409 02/16/22 0542 02/19/22 1003  AST 53* 52* 51*  --   ALT '17 17 14  '$ --   ALKPHOS 110 94 90  --   BILITOT 1.4* 1.4* 1.5*  --   PROT 7.8 6.8 6.9  --   ALBUMIN 2.1* 1.9* 1.9* 1.9*   Recent Labs  Lab 02/15/22 0409  LIPASE 47   No results for input(s): "AMMONIA" in the last 168 hours. Coagulation Profile: Recent Labs  Lab 02/14/22 2100  INR 1.2   Recent Results (from the past 240 hour(s))  Culture, body fluid w Gram Stain-bottle     Status: None (Preliminary result)   Collection Time: 02/16/22 12:52 PM   Specimen: Peritoneal Washings  Result Value Ref Range Status   Specimen Description PERITONEAL  Final   Special Requests NONE  Final   Culture   Final    NO GROWTH 4 DAYS Performed at [facility], [address] [place], [place] [address]    Report Status PENDING  Incomplete  Gram stain     Status: None   Collection Time: 02/16/22 12:52 PM   Specimen: Peritoneal Washings  Result Value Ref Range Status   Specimen Description PERITONEAL  Final   Special Requests NONE  Final   Gram Stain   Final    NO WBC SEEN NO ORGANISMS SEEN Performed at [facility], [address] [place],  [address]    Report Status 02/16/2022 FINAL  Final  Respiratory (~20 pathogens) panel by PCR     Status: None   Collection Time: 02/19/22 10:59 AM   Specimen: Nasopharyngeal Swab; Respiratory  Result Value Ref Range Status   Adenovirus NOT DETECTED NOT DETECTED  Final   Coronavirus 229E NOT DETECTED NOT DETECTED Final    Comment: (NOTE) The Coronavirus on the Respiratory Panel, DOES NOT test for the novel  Coronavirus (2019 nCoV)    Coronavirus  HKU1 NOT DETECTED NOT DETECTED Final   Coronavirus NL63 NOT DETECTED NOT DETECTED Final   Coronavirus OC43 NOT DETECTED NOT DETECTED Final   Metapneumovirus NOT DETECTED NOT DETECTED Final   Rhinovirus / Enterovirus NOT DETECTED NOT DETECTED Final   Influenza A NOT DETECTED NOT DETECTED Final   Influenza B NOT DETECTED NOT DETECTED Final   Parainfluenza Virus 1 NOT DETECTED NOT DETECTED Final   Parainfluenza Virus 2 NOT DETECTED NOT DETECTED Final   Parainfluenza Virus 3 NOT DETECTED NOT DETECTED Final   Parainfluenza Virus 4 NOT DETECTED NOT DETECTED Final   Respiratory Syncytial Virus NOT DETECTED NOT DETECTED Final   Bordetella pertussis NOT DETECTED NOT DETECTED Final   Bordetella Parapertussis NOT DETECTED NOT DETECTED Final   Chlamydophila pneumoniae NOT DETECTED NOT DETECTED Final   Mycoplasma pneumoniae NOT DETECTED NOT DETECTED Final    Comment: Performed at [facility], [place] [address], [place], [place] [address]    Antimicrobials: Anti-infectives (From admission, onward)    Start     Dose/Rate Route Frequency Ordered Stop   02/15/22 0500  cefTRIAXone (ROCEPHIN) 2 g in sodium chloride 0.9 % 100 mL IVPB  Status:  Discontinued        2 g 200 mL/hr over 30 Minutes Intravenous Every 24 hours 02/15/22 0458 02/19/22 1058      Culture/Microbiology    Component Value Date/Time   SDES PERITONEAL 02/16/2022 [place] 02/16/2022 1252   SPECREQUEST NONE 02/16/2022 1252   SPECREQUEST NONE 02/16/2022 1252   CULT  02/16/2022 1252    NO GROWTH 4 DAYS Performed at [facility], [place] [address] [place], [place] [address]    REPTSTATUS PENDING 02/16/2022 1252   REPTSTATUS 02/16/2022 FINAL 02/16/2022 1252  Radiology Studies: IR Paracentesis  Result Date: 02/20/2022 INDICATION: Patient with history of ESRD and recurrent ascites. Request for therapeutic paracentesis. EXAM: ULTRASOUND GUIDED PARACENTESIS MEDICATIONS: 10 mL 1% lidocaine COMPLICATIONS: None  immediate. PROCEDURE: Informed written consent was obtained from the patient after a discussion of the risks, benefits and alternatives to treatment. A timeout was performed prior to the initiation of the procedure. Initial ultrasound scanning demonstrates a small amount of ascites within the left lower abdominal quadrant. The left lower abdomen was prepped and draped in the usual sterile fashion. 1% lidocaine was used for local anesthesia. Following this, a 19 gauge, 10-cm, Yueh catheter was introduced. An ultrasound image was saved for documentation purposes. The paracentesis was performed. The catheter was removed and a dressing was applied. The patient tolerated the procedure well without immediate post procedural complication. FINDINGS: A total of approximately 2.2 L of hazy yellow fluid was removed. IMPRESSION: Successful ultrasound-guided paracentesis yielding 2.2 liters of peritoneal fluid. Read by: Durenda Guthrie, PA-C Electronically Signed   By: Markus Daft M.D.   On: 02/20/2022 10:15     LOS: 5 days   Antonieta Pert, MD [facility]  02/20/2022, 1:20 PM

## 2022-02-20 NOTE — Procedures (Signed)
PROCEDURE SUMMARY:  Successful image-guided paracentesis from the left lower abdomen.  Yielded 2,2 liters of hazy yellow fluid.  No immediate complications.  EBL = trace. Patient tolerated well.   Specimen was not sent for labs.  Please see imaging section of Epic for full dictation.   Armando Gang Ivery Nanney PA-C 02/20/2022 10:01 AM

## 2022-02-21 LAB — CULTURE, BODY FLUID W GRAM STAIN -BOTTLE: Culture: NO GROWTH

## 2022-02-21 LAB — CBC
HCT: 24.4 % — ABNORMAL LOW (ref 39.0–52.0)
Hemoglobin: 8.2 g/dL — ABNORMAL LOW (ref 13.0–17.0)
MCH: 32.5 pg (ref 26.0–34.0)
MCHC: 33.6 g/dL (ref 30.0–36.0)
MCV: 96.8 fL (ref 80.0–100.0)
Platelets: 69 10*3/uL — ABNORMAL LOW (ref 150–400)
RBC: 2.52 MIL/uL — ABNORMAL LOW (ref 4.22–5.81)
RDW: 17.4 % — ABNORMAL HIGH (ref 11.5–15.5)
WBC: 3.2 10*3/uL — ABNORMAL LOW (ref 4.0–10.5)
nRBC: 0 % (ref 0.0–0.2)

## 2022-02-21 LAB — BASIC METABOLIC PANEL
Anion gap: 11 (ref 5–15)
BUN: 27 mg/dL — ABNORMAL HIGH (ref 8–23)
CO2: 26 mmol/L (ref 22–32)
Calcium: 8.8 mg/dL — ABNORMAL LOW (ref 8.9–10.3)
Chloride: 97 mmol/L — ABNORMAL LOW (ref 98–111)
Creatinine, Ser: 6.35 mg/dL — ABNORMAL HIGH (ref 0.61–1.24)
GFR, Estimated: 9 mL/min — ABNORMAL LOW (ref >60)
Glucose, Bld: 101 mg/dL — ABNORMAL HIGH (ref 70–99)
Potassium: 3.9 mmol/L (ref 3.5–5.1)
Sodium: 134 mmol/L — ABNORMAL LOW (ref 135–145)

## 2022-02-21 MED ORDER — FLUTICASONE PROPIONATE 50 MCG/ACT NA SUSP
2.0000 | Freq: Every day | NASAL | Status: DC
  Filled 2022-02-21: qty 16

## 2022-02-21 MED ORDER — DIPHENHYDRAMINE HCL 25 MG PO CAPS
25.0000 mg | ORAL_CAPSULE | Freq: Once | ORAL | Status: AC
  Administered 2022-02-21: 25 mg via ORAL

## 2022-02-21 MED ORDER — DIPHENHYDRAMINE HCL 25 MG PO CAPS
ORAL_CAPSULE | ORAL | Status: AC
  Filled 2022-02-21: qty 1

## 2022-02-21 NOTE — Progress Notes (Signed)
Received patient in bed to unit.  Alert and oriented.  Informed consent signed and in chart.   Treatment initiated: 1056 Treatment completed: 1327  Patient tolerated well.  Transported back to the room  Alert, without acute distress.  Hand-off given to patient's nurse.   Access used: Fistula  Access issues: none  Total UF removed: 1900 Medication(s) given: dilauded 0.5 Post HD VS: 98, 119/64(83), HR-87, RR-13, SP02-97 Post HD weight: 99.5kg   [facility]

## 2022-02-21 NOTE — Progress Notes (Signed)
D/C order noted. Contacted [facility] to advise clinic of pt's d/c today and that pt will resume care on Monday.   Melven Sartorius Renal Navigator [phone]

## 2022-02-21 NOTE — TOC Transition Note (Addendum)
Transition of Care [facility]) - CM/SW Discharge Note   Patient Details  Name: Daniel [name] MRN: [id number] Date of Birth: December 10, 1960  Transition of Care [facility]) CM/SW Contact:  Verdell Carmine, RN Phone Number: 02/21/2022, 2:32 PM   Clinical Narrative:    Patient in dialysis, he is discharging. Called [facility] to let them know and orders placed for resumption of PT TO and RN . Patient will need wheelchair [place] home or PTAR, depending if his wheelchair is here or not. Messaged RN about DC orders  [phone] patients sister can pick him up per nurse.     Barriers to Discharge: No Barriers Identified (needs transportation set up)   Patient Goals and CMS Choice Patient states their goals for this hospitalization and ongoing recovery are:: return home      Discharge Placement                       Discharge Plan and Services                          HH Arranged: RN, PT, OT [facility] Agency: [facility] Date [place]: 02/21/22 Time [place]: [place] Representative spoke with at [place]: Mr  Quentin Cornwall  Social Determinants of Health (SDOH) Interventions Transportation Interventions: Intervention Not Indicated (Patient states he drives himself; also has medicaid for transport)   Readmission Risk Interventions    10/10/2021   10:49 AM 02/27/2021   12:51 PM 07/07/2019    4:03 PM  Readmission Risk Prevention Plan  Transportation Screening Complete Complete Complete  PCP or Specialist Appt within 3-5 Days   Complete  HRI or [place]   Complete  Social Work Consult for [place] Planning/Counseling   Complete  Palliative Care Screening   Not Applicable  Medication Review Press photographer) Complete Complete Complete  PCP or Specialist appointment within 3-5 days of discharge Complete Complete   HRI or [place] Complete Complete   SW Recovery Care/Counseling Consult Complete Complete   Palliative Care Screening Complete Not  [place] Patient Refused Not Applicable

## 2022-02-21 NOTE — Discharge Summary (Signed)
Physician Discharge Summary  GILLERMO POCH YCX:[id number] DOB: 28-Nov-1960 DOA: 02/14/2022  PCP: [name], NP  Admit date: 02/14/2022 Discharge date: 02/21/2022 Recommendations for Outpatient Follow-up:  Follow up with PCP in 1 weeks-call for appointment Please obtain BMP/CBC in one week  Discharge Dispo: home Discharge Condition: Stable Code Status:   Code Status: Full Code Diet recommendation:  Diet Order             Diet Heart Room service appropriate? 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, S/P Y90 ablation 11/28/2021), ongoing alcohol abuse, iron deficiency anemia, ESRD (HD MWF), history of subarachnoid hemorrhage (2019), peripheral vascular disease (S/P right SFA stent), status post left BKA, diastolic congestive heart failure, pulmonary hypertension, nicotine dependence (cigarettes), gastroesophageal reflux disease who presented to [facility] emergency department with complaints of abdominal pain and dark stools. Upon evaluation the patient was felt to be suffering from a gastrointestinal bleed considering patient's complicated history of GI bleeding in the past.  Patient was found to have a hemoglobin of 6.6 with several additional episodes of melena noted in the ED- 2 unit packed red blood cell transfusion was ordered and 10/22 transferred to [facility] managed for acute upper GI bleed ascites with liver cirrhosis secondary to chronic diastolic CHF. S/p IR paracentesis 250 cc seen by nephro and had HD 10/23 Again repeat paracentesis 2.2 L 10/25.  Seen by GI no plan for endoscopic evaluation planning for outpatient follow-up continue PPI, antibiotics discontinued no evidence of infection.Fluid  management done with HD.At this time euvolemic hemoglobin stable 8.2 g.Plan for dialysis home then home   Discharge Diagnoses:  Principal Problem:   Acute upper GI bleed Active Problems:   Acute on chronic diastolic CHF (congestive heart failure) (HCC)   ESRD (end stage renal disease) on dialysis ([name])   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) (HCC)   Essential hypertension   Mixed hyperlipidemia   Chronic GI bleeding   Decompensated HCV cirrhosis (HCC)   Other ascites   Melanotic stool w/  acute upper GI bleeding: Multiple EGDs, SPE, VCE's-all confirmed SB AVMs.  GI input appreciated, status post PRBC transfusion hemoglobin stable but trending down.  Insurance would not cover outpatient octreotide, GI mention about referral for double-balloon enteroscopy at [facility] but suspect even with intervention AVM will recur.  No plan for EGD/SBE given no culprit lesion on VCE will be on range of SP, completed antibiotic ceftriaxone. Continue PPI daily.   Symptomatic anemia with recurrent acute on chronic anemia.  Status post transfusions hemoglobin stable-follow-up with PCP with CBC check in a week Recent Labs  Lab 02/17/22 1048 02/18/22 1016 02/19/22 0741 02/20/22 0550 02/21/22 0721  HGB 7.3* 7.4* 7.7* 7.4* 8.2*  HCT 20.6* 21.5* 22.5* 21.2* 24.4*    Liver cirrhosis Moderate to large volume ascites per CT: question overestimation on CT, 250 cc paracentesis 10/22, today 2.2 L.He has a follow-up with [facility] 11/19.   Hypertension on amlodipine 5 mg. HLD on Lipitor 40. Acute on chronic diastolic CHF/acute pulmonary edema- resolved at this time euvolemic, fluid being managed by nephrology HD 10/27   ESRD on HD M/WF-continue HD per schedule per nephrology Anemia of CKD: No  ESA per nehro Secondary hyperparathyroidism: Phos controlled: Nephrology managing dialysis.   Leukopenia/thrombocytopenia in the setting of liver cirrhosis.  Monitor CBC platelet-stable/improving.    Alcohol use disorder: No evidence of withdrawal on folate thiamine trazodone. Hypokalemia per nephrology with HD Hyponatremia Per nephrology obesity  Sore throat:RVP panel negative, supportive care   Class I Obesity:Body mass index is 31.92 kg/m. :will benefit with PCP follow-up, weight loss  healthy lifestyle and outpatient sleep evaluation Consults: Nephrology, gastroenterology Subjective: Alert resting comfortably , want to have  dialysis before discharge home today  Discharge Exam: Vitals:   02/21/22 1200 02/21/22 1230  BP: (!) 149/85 (!) 145/65  Pulse: 86 86  Resp: 15 15  Temp:    SpO2: 97% 98%   General: Pt is alert, awake, not in acute distress Cardiovascular: RRR, S1/S2 +, no rubs, no gallops Respiratory: CTA bilaterally, no wheezing, no rhonchi Abdominal: Soft, NT, ND, bowel sounds + Extremities: no edema, no cyanosis  Discharge Instructions  Discharge Instructions     Discharge instructions   Complete by: As directed    Follow-up with PCP and check CBC in 5 to 7 days. Follow-up with GI as scheduled Please call call MD or return to ER for similar or worsening recurring problem that brought you to hospital or if any fever,nausea/vomiting,abdominal pain, uncontrolled pain, chest pain,  shortness of breath or any other alarming symptoms.  Please follow-up your doctor as instructed in a week time and call the office for appointment.  Please avoid alcohol, smoking, or any other illicit substance and maintain healthy habits including taking your regular medications as prescribed.  You were cared for by a hospitalist during your hospital stay. If you have any questions about your discharge medications or the care you received while you were in the hospital after you are discharged, you can call the unit and ask to speak with the hospitalist on call if the hospitalist that took care of you is not available.  Once you are discharged, your primary care physician will  handle any further medical issues. Please note that NO REFILLS for any discharge medications will be authorized once you are discharged, as it is imperative that you return to your primary care physician (or establish a relationship with a primary care physician if you do not have one) for your aftercare needs so that they can reassess your need for medications and monitor your lab values   Increase activity slowly   Complete by: As directed       Allergies as of 02/21/2022       Reactions   Seroquel [quetiapine] Other (See Comments)   Tardive kinesia Dystonia    Dilaudid [hydromorphone Hcl] Itching, Other (See Comments)   Pt reports itchiness after IM injection         Medication List     TAKE these medications    amLODipine 5 MG tablet Commonly known as: NORVASC Take 1 tablet (5 mg total) by mouth daily.   atorvastatin 40 MG tablet Commonly known as: LIPITOR Take 1 tablet (40 mg total) by mouth daily.   folic acid 1 MG tablet Commonly known as: FOLVITE Take 1 tablet (1 mg total) by mouth daily.   gabapentin 300 MG capsule Commonly known as: NEURONTIN Take 300 mg by mouth 3 (three) times daily.   loperamide 2 MG capsule Commonly known as: IMODIUM Take 1 capsule (2 mg total) by mouth 4 (four) times daily as needed for diarrhea or loose stools.   multivitamin with  minerals Tabs tablet Take 1 tablet by mouth daily.   pantoprazole 40 MG tablet Commonly known as: PROTONIX Take 1 tablet (40 mg total) by mouth 2 (two) times daily before a meal.   thiamine 100 MG tablet Commonly known as: VITAMIN B1 Take 1 tablet (100 mg total) by mouth daily.   traZODone 50 MG tablet Commonly known as: DESYREL Take 1 tablet (50 mg total) by mouth daily as needed for sleep. What changed: when to take this        Follow-up Information     [name], NP Follow up in 1 week(s).   Specialty: Internal Medicine Contact information: [address]  [address] [phone]                Allergies  Allergen Reactions   Seroquel [Quetiapine] Other (See Comments)    Tardive kinesia Dystonia    Dilaudid [Hydromorphone Hcl] Itching and Other (See Comments)    Pt reports itchiness after IM injection     The results of significant diagnostics from this hospitalization (including imaging, microbiology, ancillary and laboratory) are listed below for reference.    Microbiology: Recent Results (from the past 240 hour(s))  Culture, body fluid w Gram Stain-bottle     Status: None   Collection Time: 02/16/22 12:52 PM   Specimen: Peritoneal Washings  Result Value Ref Range Status   Specimen Description PERITONEAL  Final   Special Requests NONE  Final   Culture   Final    NO GROWTH 5 DAYS Performed at [facility], [address] [place], [place] [address]    Report Status 02/21/2022 FINAL  Final  Gram stain     Status: None   Collection Time: 02/16/22 12:52 PM   Specimen: Peritoneal Washings  Result Value Ref Range Status   Specimen Description PERITONEAL  Final   Special Requests NONE  Final   Gram Stain   Final    NO WBC SEEN NO ORGANISMS SEEN Performed at [facility], [address] [place], [place] [address]    Report Status 02/16/2022 FINAL  Final  Respiratory (~20 pathogens) panel by PCR     Status: None   Collection Time: 02/19/22 10:59 AM   Specimen: Nasopharyngeal Swab; Respiratory  Result Value Ref Range Status   Adenovirus NOT DETECTED NOT DETECTED Final   Coronavirus 229E NOT DETECTED NOT DETECTED Final    Comment: (NOTE) The Coronavirus on the Respiratory Panel, DOES NOT test for the novel  Coronavirus (2019 nCoV)    Coronavirus HKU1 NOT DETECTED NOT DETECTED Final   Coronavirus NL63 NOT DETECTED NOT DETECTED Final   Coronavirus OC43 NOT DETECTED NOT DETECTED Final   Metapneumovirus NOT DETECTED NOT DETECTED Final   Rhinovirus / Enterovirus NOT DETECTED NOT DETECTED Final   Influenza A NOT  DETECTED NOT DETECTED Final   Influenza B NOT DETECTED NOT DETECTED Final   Parainfluenza Virus 1 NOT DETECTED NOT DETECTED Final   Parainfluenza Virus 2 NOT DETECTED NOT DETECTED Final   Parainfluenza Virus 3 NOT DETECTED NOT DETECTED Final   Parainfluenza Virus 4 NOT DETECTED NOT DETECTED Final   Respiratory Syncytial Virus NOT DETECTED NOT DETECTED Final   Bordetella pertussis NOT DETECTED NOT DETECTED Final   Bordetella Parapertussis NOT DETECTED NOT DETECTED Final   Chlamydophila pneumoniae NOT DETECTED NOT DETECTED Final   Mycoplasma pneumoniae NOT DETECTED NOT DETECTED Final    Comment: Performed at [facility], [place] [address]  [place], [place] [address]    Procedures/Studies: IR Paracentesis  Result Date: 02/20/2022 INDICATION: Patient with history of ESRD and recurrent ascites. Request for therapeutic paracentesis. EXAM: ULTRASOUND GUIDED PARACENTESIS MEDICATIONS: 10 mL 1% lidocaine COMPLICATIONS: None immediate. PROCEDURE: Informed written consent was obtained from the patient after a discussion of the risks, benefits and alternatives to treatment. A timeout was performed prior to the initiation of the procedure. Initial ultrasound scanning demonstrates a small amount of ascites within the left lower abdominal quadrant. The left lower abdomen was prepped and draped in the usual sterile fashion. 1% lidocaine was used for local anesthesia. Following this, a 19 gauge, 10-cm, Yueh catheter was introduced. An ultrasound image was saved for documentation purposes. The paracentesis was performed. The catheter was removed and a dressing was applied. The patient tolerated the procedure well without immediate post procedural complication. FINDINGS: A total of approximately 2.2 L of hazy yellow fluid was removed. IMPRESSION: Successful ultrasound-guided paracentesis yielding 2.2 liters of peritoneal fluid. Read by: Durenda Guthrie, PA-C Electronically Signed   By: Markus Daft M.D.   On: 02/20/2022  10:15   US Abdomen Limited  Result Date: 02/17/2022 CLINICAL DATA:  History of ascites EXAM: LIMITED ABDOMEN ULTRASOUND FOR ASCITES TECHNIQUE: Limited ultrasound survey for ascites was performed in all four abdominal quadrants. COMPARISON:  CT from 02/15/2022 FINDINGS: Moderate ascites is noted in all 4 quadrants. Persistent bilateral effusions are noted right greater than left similar to that seen on prior CT examination. IMPRESSION: Persistent moderate ascites in all 4 quadrants. Bilateral pleural effusions are noted. Electronically Signed   By: Inez Catalina M.D.   On: 02/17/2022 21:37   US Paracentesis  Result Date: 02/16/2022 INDICATION: Patient with history of ESRD presents with abdominal distension, CT abdomen pelvis without contrast yesterday showed moderate ascites. Request for therapeutic and diagnostic paracentesis. EXAM: ULTRASOUND GUIDED  PARACENTESIS MEDICATIONS: 10 mL 1% lidocaine COMPLICATIONS: None immediate. PROCEDURE: Informed written consent was obtained from the patient after a discussion of the risks, benefits and alternatives to treatment. A timeout was performed prior to the initiation of the procedure. Initial ultrasound scanning demonstrates a small amount of ascites within the left upper abdominal quadrant. The right lower abdomen was prepped and draped in the usual sterile fashion. 1% lidocaine was used for local anesthesia. Following this, a 19 gauge, 7-cm, Yueh catheter was introduced. An ultrasound image was saved for documentation purposes. The paracentesis was performed. The catheter was removed and a dressing was applied. The patient tolerated the procedure well without immediate post procedural complication. FINDINGS: A total of approximately 250 mL of hazy yellow fluid was removed. Samples were sent to the laboratory as requested by the clinical team. IMPRESSION: Successful ultrasound-guided paracentesis yielding 250 mLof peritoneal fluid. Read by: Durenda Guthrie, PA-C  Electronically Signed   By: Lucrezia Europe M.D.   On: 02/16/2022 13:23   CT ABDOMEN PELVIS WO CONTRAST  Result Date: 02/15/2022 CLINICAL DATA:  Abdominal pain.  Melena. EXAM: CT ABDOMEN AND PELVIS WITHOUT CONTRAST TECHNIQUE: Multidetector CT imaging of the abdomen and pelvis was performed following the standard protocol without IV contrast. RADIATION DOSE REDUCTION: This exam was performed according to the departmental dose-optimization program which includes automated exposure control, adjustment of the mA and/or kV according to patient size and/or use of iterative reconstruction technique. COMPARISON:  CT AP 01/31/22 FINDINGS: Lower chest: Redemonstrated moderate right and small left pleural effusion with associated bibasilar pulmonary opacities, unchanged compared to recent prior CT chest. Hepatobiliary: Redemonstrated nodular liver contour compatible  with patient's history of cirrhosis. Redemonstrated hypodense lesion in the central liver, not significantly changed compared to prior exam. Gallbladder is decompressed with a small stone at the gallbladder neck. There is persistent moderate volume ascites, not significantly changed compared to prior exam. Pancreas: No pancreatic ductal dilatation or surrounding inflammatory changes. Spleen: Normal in size without focal abnormality. Adrenals/Urinary Tract: Bilateral glands are normal in size. Bilateral kidneys are small in size. There is evidence of left-sided nephrolithiasis without hydronephrosis. The urinary bladder is fluid-filled without focal wall thickening. Stomach/Bowel: The stomach, small bowel, large bowel are normal in caliber. No evidence of bowel obstruction. No evidence of pneumoperitoneum. No evidence of pneumatosis. Appendix is not definitively visualized. A candidate appendix along the right lateral margin of the cecum appears normal (series 2, image 64). Vascular/Lymphatic: There is extensive vascular calcification of the abdominopelvic  vasculature, as well as the bilateral outflow vessels. Reproductive: Prostate is normal in size. Other: No abdominal wall hernia or abnormality. Musculoskeletal: No acute or significant osseous findings. IMPRESSION: 1. No acute abdominal pathology this noncontrast enhanced exam. Redemonstrated findings of hepatic cirrhosis with moderate to large volume ascites, unchanged compared to prior exam from 01/31/2022. Given history of melena, if there is concern for variceal bleed, further evaluation with a contrast-enhanced GI bleed protocol study is recommended. 2. Redemonstrated hypodense lesion in the central liver, not significantly unchanged compared to prior exam. 3. Redemonstrated moderate right and small left pleural effusions with associated bibasilar pulmonary opacities, unchanged compared to recent prior CT chest. 4. Cholelithiasis without evidence of cholecystitis. 5. Nonobstructive left nephrolithiasis. Electronically Signed   By: Marin Roberts M.D.   On: 02/15/2022 12:15   DG Chest Port 1 View  Result Date: 02/15/2022 CLINICAL DATA:  Shortness of breath. EXAM: PORTABLE CHEST 1 VIEW COMPARISON:  01/24/2022 FINDINGS: Cardiomegaly again identified. Pulmonary vascular congestion again noted. Increasing bilateral interstitial and airspace opacities within the mid and lower lungs noted, question edema versus infection. A small RIGHT pleural effusion is again noted. There is no evidence of pneumothorax. IMPRESSION: 1. Increasing bilateral interstitial and airspace opacities, question edema versus infection. 2. Unchanged small RIGHT pleural effusion. Electronically Signed   By: Margarette Canada M.D.   On: 02/15/2022 09:07   DG Tibia/Fibula Right  Result Date: 02/11/2022 CLINICAL DATA:  Leg pain EXAM: RIGHT TIBIA AND FIBULA - 2 VIEW COMPARISON:  None Available. 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 No acute bony abnormality. No fracture, subluxation or  dislocation. Diffuse vascular calcifications. IMPRESSION: No acute bony abnormality. Electronically Signed   By: Rolm Baptise M.D.   On: 02/11/2022 23:47   CT ABDOMEN PELVIS WO CONTRAST  Result Date: 01/31/2022 CLINICAL DATA:  Abdominal pain and diarrhea. Fever. Cirrhosis. Hepatocellular carcinoma. * Tracking Code: BO * EXAM: CT ABDOMEN AND PELVIS WITHOUT CONTRAST TECHNIQUE: Multidetector CT imaging of the abdomen and pelvis was performed following the standard protocol without IV contrast. RADIATION DOSE REDUCTION: This exam was performed according to the departmental dose-optimization program which includes automated exposure control, adjustment of the mA and/or kV according to patient size and/or use of iterative reconstruction technique. COMPARISON:  01/21/2022 FINDINGS: Lower chest: Bilateral lower lobe airspace disease again seen, as well as small right pleural effusion. Hepatobiliary: Hepatic cirrhosis is again demonstrated. 4.2 x 4.2 cm mass in the central liver shows no significant change, consistent with known hepatocellular carcinoma. Moderate ascites and diffuse mesenteric edema shows no significant change. A few tiny calcified gallstones are seen,  however there is no evidence of acute cholecystitis or biliary ductal dilatation. Pancreas: No mass or inflammatory process visualized on this unenhanced exam. Spleen:  Within normal limits in size. Adrenals/Urinary tract: 2 mm calculus again noted in lower pole of left kidney. No evidence of ureteral calculi or hydronephrosis. Unremarkable unopacified urinary bladder. Stomach/Bowel: No evidence of obstruction, inflammatory process, or abnormal fluid collections. Vascular/Lymphatic: No pathologically enlarged lymph nodes identified. No evidence of abdominal aortic aneurysm. Aortic atherosclerotic calcification incidentally noted. Reproductive:  No mass or other significant abnormality. Other:  None. Musculoskeletal:  No suspicious bone lesions identified.  IMPRESSION: Hepatic cirrhosis. Stable 4.2 cm mass in central liver, consistent with known hepatocellular carcinoma. Stable moderate ascites and diffuse mesenteric edema. Cholelithiasis. No radiographic evidence of acute cholecystitis. Tiny left renal calculus. No evidence of ureteral calculi or hydronephrosis. No significant change in bilateral lower lobe airspace disease and small right pleural effusion. Electronically Signed   By: Marlaine Hind M.D.   On: 01/31/2022 19:15   DG ABD ACUTE 2+V W 1V CHEST  Result Date: 01/24/2022 CLINICAL DATA:  Generalized weakness since Wednesday. Abdominal pain. EXAM: DG ABDOMEN ACUTE WITH 1 VIEW CHEST COMPARISON:  Chest radiograph 01/20/2022 FINDINGS: Increased opacities in the right perihilar region. Slightly increased interstitial densities in the mid left lung. Again noted are densities at the right lung base compatible with atelectasis and pleural fluid. Right pleural effusion may have slightly enlarged. Heart size is upper limits of normal but stable. Atherosclerotic calcifications at the aortic arch. Again noted is a vascular stent in the left axilla. Nonobstructive bowel gas pattern. No evidence for free air. Extensive vascular calcifications in the pelvis. IMPRESSION: 1. Increased opacities in the right perihilar region and slightly increased interstitial densities in the mid left lung. Findings are concerning for pulmonary edema. 2. Persistent densities at the right lung base are compatible with pleural fluid and atelectasis. Right pleural effusion may have slightly enlarged. 3. Nonobstructive bowel gas pattern. Electronically Signed   By: Markus Daft M.D.   On: 01/24/2022 17:23    Labs: BNP (last 3 results) Recent Labs    10/08/21 1201 01/02/22 0655 02/11/22 2321  BNP 1,688.9* 1,034.3* 503.8*   Basic Metabolic Panel: Recent Labs  Lab 02/15/22 0409 02/16/22 0542 02/17/22 0418 02/18/22 1016 02/19/22 0741 02/19/22 1003 02/20/22 0550 02/21/22 0721  NA  131* 130* 128* 128* 130* 130* 133* 134*  K 3.5 3.6 3.3* 3.3* 3.1* 3.2* 3.4* 3.9  CL 97* 92* 93* 92* 92* 90* 96* 97*  CO2 '25 26 22 26 25 24 28 26  '$ GLUCOSE 99 73 97 83 89 113* 122* 101*  BUN 38* 46* 49* 22 26* 27* 19 27*  CREATININE 4.91* 6.29* 6.92* 4.82* 5.93* 6.07* 4.89* 6.35*  CALCIUM 7.4* 8.5* 8.0* 7.8* 8.3* 8.3* 8.1* 8.8*  MG 1.8 1.9  --   --   --   --   --   --   PHOS 3.3  --  4.7*  --   --  4.4  --   --    Liver Function Tests: Recent Labs  Lab 02/14/22 2100 02/15/22 0409 02/16/22 0542 02/19/22 1003  AST 53* 52* 51*  --   ALT '17 17 14  '$ --   ALKPHOS 110 94 90  --   BILITOT 1.4* 1.4* 1.5*  --   PROT 7.8 6.8 6.9  --   ALBUMIN 2.1* 1.9* 1.9* 1.9*   Recent Labs  Lab 02/15/22 0409  LIPASE 47   No results  for input(s): "AMMONIA" in the last 168 hours. CBC: Recent Labs  Lab 02/14/22 2100 02/15/22 0235 02/15/22 0409 02/15/22 1552 02/16/22 0542 02/17/22 1048 02/18/22 1016 02/19/22 0741 02/20/22 0550 02/21/22 0721  WBC 3.9*  --  3.9*  --  3.3* 3.0* 2.6* 2.8* 2.6* 3.2*  NEUTROABS 2.6  --  2.0  --  1.9  --   --   --   --   --   HGB 7.6*   < > 6.3*   < > 7.4* 7.3* 7.4* 7.7* 7.4* 8.2*  HCT 22.4*   < > 19.0*   < > 21.2* 20.6* 21.5* 22.5* 21.2* 24.4*  MCV 98.2  --  100.5*  --  92.6 92.4 93.5 95.7 95.5 96.8  PLT 101*  --  94*  --  80* 73* 41* 70* 68* 69*   < > = values in this interval not displayed.   Cardiac Enzymes: No results for input(s): "CKTOTAL", "CKMB", "CKMBINDEX", "TROPONINI" in the last 168 hours. BNP: Invalid input(s): "POCBNP" CBG: Recent Labs  Lab 02/18/22 0312  GLUCAP 87   D-Dimer No results for input(s): "DDIMER" in the last 72 hours. Hgb A1c No results for input(s): "HGBA1C" in the last 72 hours. Lipid Profile No results for input(s): "CHOL", "HDL", "LDLCALC", "TRIG", "CHOLHDL", "LDLDIRECT" in the last 72 hours. Thyroid function studies No results for input(s): "TSH", "T4TOTAL", "T3FREE", "THYROIDAB" in the last 72 hours.  Invalid input(s):  "FREET3" Anemia work up No results for input(s): "VITAMINB12", "FOLATE", "FERRITIN", "TIBC", "IRON", "RETICCTPCT" in the last 72 hours. Urinalysis    Component Value Date/Time   COLORURINE YELLOW 12/09/2021 0900   APPEARANCEUR CLEAR 12/09/2021 0900   LABSPEC 1.008 12/09/2021 0900   PHURINE 5.0 12/09/2021 0900   GLUCOSEU NEGATIVE 12/09/2021 0900   HGBUR MODERATE (A) 12/09/2021 0900   BILIRUBINUR NEGATIVE 12/09/2021 0900   KETONESUR NEGATIVE 12/09/2021 0900   PROTEINUR 100 (A) 12/09/2021 0900   UROBILINOGEN 0.2 12/31/2009 0746   NITRITE NEGATIVE 12/09/2021 0900   LEUKOCYTESUR NEGATIVE 12/09/2021 0900   Sepsis Labs Recent Labs  Lab 02/18/22 1016 02/19/22 0741 02/20/22 0550 02/21/22 0721  WBC 2.6* 2.8* 2.6* 3.2*   Microbiology Recent Results (from the past 240 hour(s))  Culture, body fluid w Gram Stain-bottle     Status: None   Collection Time: 02/16/22 12:52 PM   Specimen: Peritoneal Washings  Result Value Ref Range Status   Specimen Description PERITONEAL  Final   Special Requests NONE  Final   Culture   Final    NO GROWTH 5 DAYS Performed at [facility], [address] [place], [place] [address]    Report Status 02/21/2022 FINAL  Final  Gram stain     Status: None   Collection Time: 02/16/22 12:52 PM   Specimen: Peritoneal Washings  Result Value Ref Range Status   Specimen Description PERITONEAL  Final   Special Requests NONE  Final   Gram Stain   Final    NO WBC SEEN NO ORGANISMS SEEN Performed at [facility], [address], [place], [place] [address]    Report Status 02/16/2022 FINAL  Final  Respiratory (~20 pathogens) panel by PCR     Status: None   Collection Time: 02/19/22 10:59 AM   Specimen: Nasopharyngeal Swab; Respiratory  Result Value Ref Range Status   Adenovirus NOT DETECTED NOT DETECTED Final   Coronavirus 229E NOT DETECTED NOT DETECTED Final    Comment: (NOTE) The Coronavirus on the Respiratory Panel, DOES NOT  test for the novel   Coronavirus (2019 nCoV)    Coronavirus HKU1 NOT DETECTED NOT DETECTED Final   Coronavirus NL63 NOT DETECTED NOT DETECTED Final   Coronavirus OC43 NOT DETECTED NOT DETECTED Final   Metapneumovirus NOT DETECTED NOT DETECTED Final   Rhinovirus / Enterovirus NOT DETECTED NOT DETECTED Final   Influenza A NOT DETECTED NOT DETECTED Final   Influenza B NOT DETECTED NOT DETECTED Final   Parainfluenza Virus 1 NOT DETECTED NOT DETECTED Final   Parainfluenza Virus 2 NOT DETECTED NOT DETECTED Final   Parainfluenza Virus 3 NOT DETECTED NOT DETECTED Final   Parainfluenza Virus 4 NOT DETECTED NOT DETECTED Final   Respiratory Syncytial Virus NOT DETECTED NOT DETECTED Final   Bordetella pertussis NOT DETECTED NOT DETECTED Final   Bordetella Parapertussis NOT DETECTED NOT DETECTED Final   Chlamydophila pneumoniae NOT DETECTED NOT DETECTED Final   Mycoplasma pneumoniae NOT DETECTED NOT DETECTED Final    Comment: Performed at [facility], [address], [place], [place] [address]  Time coordinating discharge: 25 minutes  SIGNED: Antonieta Pert, MD  [facility] 02/21/2022, 12:44 PM  If 7PM-7AM, please contact night-coverage [email or web address]

## 2022-02-21 NOTE — Progress Notes (Signed)
[facility] Progress Note  Name: LATAVIUS CAPIZZI MRN: [id number] DOB: 30-Apr-1960   Subjective:  Seen and examined on dialysis.  Blood pressure 143/83 and HR 90. Procedure supervised.  LUE AVF in use.  Tolerating goal.  Team is planning to send him home today.   Review of systems:     He reports shortness of breath  Feels weak  Denies n/v He had a BM - BM was more brown than before  -------------------- Background on consult:  KEMO SPRUCE is a 61 y.o. male with ESRD on HD MWF at [facility]. His past medical history is significant for recent diagnosis of hepatocellular carcinoma several months ago (not on treatment-follows with 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   Patient presented to [facility] ED c/o ABD distention and melena stools. He then was transferred here for ongoing management and hemodialysis needs. GI is following. S/p 2 units PRBCs at admit for Hgb 6.6. Hgb now 7.4. Per GI, clinical presentation not consistent with variceal bleed, thus emergent EGD is not indicated. Continue PPI and labs. On full liquid diet. Seen and examined patient at bedside. Seen sitting up in bed inquiring when can he eat. He denies SOB, CP, and N/V. Last HD on 10/20 where he received full treatment. CXR showed BL interstitial and airspace opacities and unchanged R pleural effusion. Noted IR consulted for paracentesis. Plan for HD 02/17/22 per his routine schedule.   Intake/Output Summary (Last 24 hours) at 02/21/2022 1224 Last data filed at 02/21/2022 0300 Gross per 24 hour  Intake  --  Output 450 ml  Net -450 ml    Vitals:  Vitals:   02/21/22 1056 02/21/22 1100 02/21/22 1155 02/21/22 1200  BP: (!) 143/83 (!) 143/97 (!) 144/95 (!) 149/85  Pulse: 84 83 83 86  Resp: 16 (!) '21 17 15  '$ Temp:      TempSrc:      SpO2:   97% 97%  Weight:      Height:         Physical Exam:     General adult male in bed in no acute distress HEENT normocephalic atraumatic extraocular movements intact sclera anicteric Neck supple trachea midline Lungs clear to auscultation bilaterally normal work of breathing at rest; on room air  Heart S1S2 no rub Abdomen soft nontender nondistended Extremities no edema RLE or left residual limb; left BKA Psych normal mood and affect Neuro alert and oriented x 3 provides hx and follows commands Access - LUE AVF with bruit and thrill   Medications reviewed   Labs:     Latest Ref Rng & Units 02/21/2022    7:21 AM 02/20/2022    5:50 AM 02/19/2022   10:03 AM  BMP  Glucose 70 - 99 mg/dL 101  122  113   BUN 8 - 23 mg/dL '27  19  27   '$ Creatinine 0.61 - 1.24 mg/dL 6.35  4.89  6.07   Sodium 135 - 145 mmol/L 134  133  130   Potassium 3.5 - 5.1 mmol/L 3.9  3.4  3.2   Chloride 98 - 111 mmol/L 97  96  90   CO2 22 - 32 mmol/L 26  28  24   Calcium 8.9 - 10.3 mg/dL 8.8  8.1  8.3    Dialysis Orders:  MWF - [facility] 4hrs, BFR 450, DFR 500,  EDW 98.8kg, 2K/ 2Ca NO HEPARIN NO ESA D/T ACTIVE CANCER  Assessment/Plan:   Acute on chronic anemia/Melena - had multiple GI procedures completed in the past. Tried treating with octreotide but not approved by his insurance per past charting. On PPI.  per primary team. GI has signed off.  ABD distention - s/p paracentesis with IR - only 250 mL charted as removed.  Paracentesis per GI discretion ESRD - on HD MWF Hypertension/volume  - optimize volume with HD Hyponatremia - secondary to cirrhosis; improved with HD Anemia of CKD -  No ESA d/t active cancer Secondary Hyperparathyroidism - phos  controlled  Cirrhosis - per GI and primary team.  Paracentesis as above  Thrombocytopenia secondary to cirrhosis - stable   Disposition - per primary team   Claudia Desanctis, MD 02/21/2022 12:33 PM

## 2022-02-22 ENCOUNTER — Telehealth: Payer: Self-pay | Admitting: Nephrology

## 2022-02-22 NOTE — Telephone Encounter (Signed)
Transition of care contact from inpatient facility  Date of Discharge: 02/21/22 Date of Contact: 02/22/22 Method of contact: Phone -attempt  Attempted to contact patient to discuss transition of care from inpatient admission. Patient did not answer the phone. Will follow up with patient at outpatient dialysis

## 2022-02-25 ENCOUNTER — Telehealth: Payer: Self-pay | Admitting: *Deleted

## 2022-02-25 ENCOUNTER — Encounter: Payer: Self-pay | Admitting: *Deleted

## 2022-02-25 NOTE — Patient Outreach (Signed)
Care Coordination   Follow Up Visit Note   02/25/2022 Name: Daniel [name] MRN: [id number] DOB: February 04, 1961  Daniel [name] is a 61 y.o. year old male who sees [name], NP for primary care. I spoke with  Daniel [name] by phone today.  Pt was hospitalized Oct 20 - 27th due to recurrent GI bleeding.  Patient was recently discharged from hospital and all medications have been reviewed. Pt does not have some of his meds and others are low in supply.  He does not have a follow up appt with his PCP for needed labs and follow up. He has not actually seen his PCP since August.  Outpatient Encounter Medications as of 02/25/2022  Medication Sig Note   amLODipine (NORVASC) 5 MG tablet Take 1 tablet (5 mg total) by mouth daily. 02/15/2022: Last dispense 10/6 30ds   loperamide (IMODIUM) 2 MG capsule Take 1 capsule (2 mg total) by mouth 4 (four) times daily as needed for diarrhea or loose stools.    atorvastatin (LIPITOR) 40 MG tablet Take 1 tablet (40 mg total) by mouth daily. 02/15/2022: Last dispense 7/82 95AO   folic acid (FOLVITE) 1 MG tablet Take 1 tablet (1 mg total) by mouth daily. (Patient not taking: Reported on 02/25/2022)    gabapentin (NEURONTIN) 300 MG capsule Take 300 mg by mouth 3 (three) times daily. (Patient not taking: Reported on 02/25/2022) 02/15/2022: Last dispense 10/13 30ds   Multiple Vitamin (MULTIVITAMIN WITH MINERALS) TABS tablet Take 1 tablet by mouth daily. (Patient not taking: Reported on 02/25/2022)    pantoprazole (PROTONIX) 40 MG tablet Take 1 tablet (40 mg total) by mouth 2 (two) times daily before a meal. (Patient not taking: Reported on 02/25/2022) 02/15/2022: Last dispense 10/13 30ds   thiamine (VITAMIN B1) 100 MG tablet Take 1 tablet (100 mg total) by mouth daily. (Patient not taking: Reported on 02/25/2022)    traZODone (DESYREL) 50 MG tablet Take 1 tablet (50 mg total) by mouth daily as needed for sleep. (Patient not taking: Reported on  02/25/2022) 02/15/2022: Last dispense 9/13 90ds   [DISCONTINUED] colchicine 0.6 MG tablet Take 0.5 tablets (0.3 mg total) by mouth 2 (two) times daily.    [DISCONTINUED] furosemide (LASIX) 40 MG tablet Take 1 tablet (40 mg total) by mouth daily.    No facility-administered encounter medications on file as of 02/25/2022.     What matters to the patients health and wellness today?  Need meds, Need something for back pain.    Goals Addressed               This Visit's Progress     Patient Stated     Switch to dialysis center closer to his home to save gas. (pt-stated)        Care Coordination Interventions: Assessed pt update on change of location for his dialysis. He has not heard anything and says he has given up.       Other     Improve self management of chronic illnesses        Care Coordination Interventions: Assessed pt post hospitalization status: He does not have a follow up appt with his primary and doesn't want to make one. He doesna't have all this medications, has called his PCP for refills and was directed to call him pharmacy. He is frustrated because he must do this for himself. Educated pt that it is each person's responsibility to make appts and call their pharmacy to request refills. The  pharmacy will contact the PCP if you need a refill. Pt was very annoyed. NP sent request to PCP for a FOLLOW UP HOSPIITALIZATION APPT FOR LABS AND MED REVIEW, RXS. NP called pt pharmacy and requested his needed refills NP called pt to let him know he can pick up his Rxs after 3:00 today. Encouraged pt to attend his follow up visit as this is very important to prevent complications and to monitor his labs to identify if his blood count is going down again. Pt did say he would make and appt.        SDOH assessments and interventions completed:  Yes  SDOH Interventions Today    Flowsheet Row Most Recent Value  SDOH Interventions   Transportation Interventions Intervention  Not Indicated  [Pt car is repaired and he can drive himself now.]        Care Coordination Interventions Activated:  Yes  Care Coordination Interventions:  Yes, provided   Follow up plan: Follow up call scheduled for 1 week.    Encounter Outcome:  Pt. Visit Completed   Kayleen Memos C. Myrtie Neither, MSN, [facility] Gerontological Nurse Practitioner [facility] Care Management [phone]

## 2022-02-26 ENCOUNTER — Telehealth: Payer: Self-pay

## 2022-02-26 NOTE — Telephone Encounter (Signed)
Transition Care Management Follow-up Telephone Call Date of discharge and from where: 02/21/2022, [facility] How have you been since you were released from the hospital? He stated he is feeling okay and was at HD at the time of this call.  Any questions or concerns? No  Items Reviewed: Did the pt receive and understand the discharge instructions provided? Yes  Medications obtained and verified?  He said he has some of the medications but not all of them.  He needs to pick them up at the pharmacy but will not have enough money to get them until Fri- 02/28/2022.  Other? No  Any new allergies since your discharge? No  Dietary orders reviewed? no Do you have support at home?  He lives in a [place] and cares for himself.   Home Care and Equipment/Supplies: Were home health services ordered? yes If so, what is the name of the agency? [facility]  Has the agency set up a time to come to the patient's home? Yes- he said they have already been out to see him. Were any new equipment or medical supplies ordered?  No What is the name of the medical supply agency? N/a Were you able to get the supplies/equipment? not applicable Do you have any questions related to the use of the equipment or supplies? No  Functional Questionnaire: (I = Independent and D = Dependent) ADLs: uses a wheelchair for mobility and he said he has been using his LLE prosthesis. Independent with personal care.  Attends HD : M/W/F at [facility].    Follow up appointments reviewed:  PCP Hospital f/u appt confirmed? Yes  Scheduled to see [name], NP - 03/06/2022.  [facility] f/u appt confirmed? Yes  Scheduled to see GI- 03/06/2022, cardiology - 04/01/2022.  o Are transportation arrangements needed? No  If their condition worsens, is the pt aware to call PCP or go to the Emergency Dept.? Yes Was the patient provided with contact information for the PCP's office or ED? Yes Was to pt encouraged to call  back with questions or concerns? Yes

## 2022-03-04 ENCOUNTER — Encounter: Payer: Self-pay | Admitting: *Deleted

## 2022-03-04 ENCOUNTER — Telehealth: Payer: Self-pay | Admitting: *Deleted

## 2022-03-04 ENCOUNTER — Telehealth (INDEPENDENT_AMBULATORY_CARE_PROVIDER_SITE_OTHER): Payer: Self-pay | Admitting: Primary Care

## 2022-03-04 NOTE — Telephone Encounter (Signed)
Marissa social working with [employer] called saying patient is out of his Hydroxyzine she thinks.  He has been offof it for a while.  She wants to know if he should be taking it.   He use to take this for anxiety and sleep  He uses [address]  CB#  [phone]

## 2022-03-04 NOTE — Telephone Encounter (Signed)
Marissa called back for verbal orders for subsequent visit in two weeks   [phone]

## 2022-03-04 NOTE — Telephone Encounter (Signed)
Will forward to provider  

## 2022-03-04 NOTE — Patient Outreach (Signed)
  Care Coordination   Follow Up Visit Note   03/04/2022 Name: Daniel [name] MRN: [id number] DOB: [birth year]  Daniel [name] is a 61 y.o. year old male who sees [name], NP for primary care. I spoke with  Daniel [name] by phone today.  What matters to the patients health and wellness today?  Getting his hydroxizine, getting a new place to live.    Goals Addressed               This Visit's Progress     Patient Stated     Find new housing (pt-stated)        Care Coordination Interventions: Solution-Focused Strategies employed:  Active listening / Reflection utilized  Emotional Support Provided  NP assisted pt to apply to [facility] in June and again in August. NP called today and left a message requesting an update on pt's status for housing.      Other     Improve self management of chronic illnesses   On track     Care Coordination Interventions: Evaluation of current treatment plan related to [facility] PCP AND GI (BOTH APPTS ARE THIS WEEK, THURSDAY AND PT DOES HAVE THEM ON HIS CALENDAR AND STATES HE IS PLANNING TO GO (HE DOES HAVE HIS OWN TRANSPORTATION) and patient's adherence to plan as established by provider Advised patient to CALL NP OR MD FOR ANY PROBLEMS TO AVOID HOSPITALIZATION.  Reviewed medications with patient and discussed HIS REQUEST TO HIS LCSW AND SHARED WITH PT THAT THE NEED FOR HIS HYDROXIZINE WAS COMMUNICATED. ADVISED HE CAN CALL HIS PHARMACY TO SEE IF THEY HAVE PROCESSED THE ORDER.         SDOH assessments and interventions completed:  Yes  SDOH Interventions Today    Flowsheet Row Most Recent Value  SDOH Interventions   Transportation Interventions Other (Comment)  [Pt has his own transportation for now.]        Care Coordination Interventions Activated:  Yes  Care Coordination Interventions:  Yes, provided   Follow up plan: Follow up call scheduled for 2 weeks.    Encounter Outcome:  Pt. Visit Completed    Kayleen Memos C. Myrtie Neither, MSN, [facility] Gerontological Nurse Practitioner [facility] Care Management [phone]

## 2022-03-05 NOTE — Telephone Encounter (Signed)
Returned [facility] [employer] and provided verbal orders

## 2022-03-06 ENCOUNTER — Ambulatory Visit: Payer: Medicare Other | Admitting: Physician Assistant

## 2022-03-06 ENCOUNTER — Inpatient Hospital Stay (INDEPENDENT_AMBULATORY_CARE_PROVIDER_SITE_OTHER): Payer: Medicare Other | Admitting: Primary Care

## 2022-03-07 ENCOUNTER — Encounter (HOSPITAL_COMMUNITY): Payer: Self-pay | Admitting: Emergency Medicine

## 2022-03-07 ENCOUNTER — Other Ambulatory Visit: Payer: Self-pay

## 2022-03-07 ENCOUNTER — Emergency Department (HOSPITAL_COMMUNITY)
Admission: EM | Admit: 2022-03-07 | Discharge: 2022-03-08 | Discharge status: Against Medical Advice | Payer: Medicare Other | Attending: Emergency Medicine | Admitting: Emergency Medicine

## 2022-03-07 DIAGNOSIS — R1010 Upper abdominal pain, unspecified: Secondary | ICD-10-CM | POA: Diagnosis not present

## 2022-03-07 DIAGNOSIS — Z5321 Procedure and treatment not carried out due to patient leaving prior to being seen by health care provider: Secondary | ICD-10-CM | POA: Diagnosis not present

## 2022-03-08 DIAGNOSIS — R1010 Upper abdominal pain, unspecified: Secondary | ICD-10-CM | POA: Diagnosis not present

## 2022-03-08 LAB — CBC WITH DIFFERENTIAL/PLATELET
Abs Immature Granulocytes: 0.01 10*3/uL (ref 0.00–0.07)
Basophils Absolute: 0 10*3/uL (ref 0.0–0.1)
Basophils Relative: 1 %
Eosinophils Absolute: 0.1 10*3/uL (ref 0.0–0.5)
Eosinophils Relative: 2 %
HCT: 23.7 % — ABNORMAL LOW (ref 39.0–52.0)
Hemoglobin: 7.7 g/dL — ABNORMAL LOW (ref 13.0–17.0)
Immature Granulocytes: 0 %
Lymphocytes Relative: 21 %
Lymphs Abs: 0.8 10*3/uL (ref 0.7–4.0)
MCH: 32.6 pg (ref 26.0–34.0)
MCHC: 32.5 g/dL (ref 30.0–36.0)
MCV: 100.4 fL — ABNORMAL HIGH (ref 80.0–100.0)
Monocytes Absolute: 0.8 10*3/uL (ref 0.1–1.0)
Monocytes Relative: 19 %
Neutro Abs: 2.2 10*3/uL (ref 1.7–7.7)
Neutrophils Relative %: 57 %
Platelets: 123 10*3/uL — ABNORMAL LOW (ref 150–400)
RBC: 2.36 MIL/uL — ABNORMAL LOW (ref 4.22–5.81)
RDW: 16.7 % — ABNORMAL HIGH (ref 11.5–15.5)
WBC: 3.9 10*3/uL — ABNORMAL LOW (ref 4.0–10.5)
nRBC: 0 % (ref 0.0–0.2)

## 2022-03-08 LAB — COMPREHENSIVE METABOLIC PANEL
ALT: 22 U/L (ref 0–44)
AST: 85 U/L — ABNORMAL HIGH (ref 15–41)
Albumin: 1.8 g/dL — ABNORMAL LOW (ref 3.5–5.0)
Alkaline Phosphatase: 103 U/L (ref 38–126)
Anion gap: 10 (ref 5–15)
BUN: 15 mg/dL (ref 8–23)
CO2: 30 mmol/L (ref 22–32)
Calcium: 8.5 mg/dL — ABNORMAL LOW (ref 8.9–10.3)
Chloride: 91 mmol/L — ABNORMAL LOW (ref 98–111)
Creatinine, Ser: 4.59 mg/dL — ABNORMAL HIGH (ref 0.61–1.24)
GFR, Estimated: 14 mL/min — ABNORMAL LOW (ref >60)
Glucose, Bld: 87 mg/dL (ref 70–99)
Potassium: 4.1 mmol/L (ref 3.5–5.1)
Sodium: 131 mmol/L — ABNORMAL LOW (ref 135–145)
Total Bilirubin: 1.1 mg/dL (ref 0.3–1.2)
Total Protein: 8.1 g/dL (ref 6.5–8.1)

## 2022-03-08 LAB — LIPASE, BLOOD: Lipase: 79 U/L — ABNORMAL HIGH (ref 11–51)

## 2022-03-08 NOTE — ED Provider Triage Note (Signed)
Emergency Medicine Provider Triage Evaluation Note  Daniel [name] , a 61 y.o. male  was evaluated in triage.  Pt complains of upper abdominal pain which began this morning. Has not taken any medication for the pain. No fevers, N/V. Has been more constipated; last BM 3 days ago. Last dialyzed today.  Review of Systems  Positive: As above Negative: As above  Physical Exam  There were no vitals taken for this visit. Gen:   Awake, no distress   Resp:  Normal effort  MSK:   Moves extremities without difficulty  Other:  Abdomen soft, nondistended.  Medical Decision Making  Medically screening exam initiated at 12:02 AM.  Appropriate orders placed.  Mccartney C Wigfall was informed that the remainder of the evaluation will be completed by another provider, this initial triage assessment does not replace that evaluation, and the importance of remaining in the ED until their evaluation is complete.  Upper abdominal pain - pending labs   Antonietta Breach, PA-C 03/08/22 0004

## 2022-03-08 NOTE — ED Triage Notes (Signed)
Patient reports pain across upper abdomen onset today , no emesis or diarrhea , denies fever . He adds swelling/edema at right lower leg/right foot this week . HD q Mon/Wed/Fri.

## 2022-03-10 ENCOUNTER — Telehealth (INDEPENDENT_AMBULATORY_CARE_PROVIDER_SITE_OTHER): Payer: Self-pay | Admitting: Primary Care

## 2022-03-10 ENCOUNTER — Inpatient Hospital Stay: Payer: Medicare Other

## 2022-03-10 NOTE — Telephone Encounter (Signed)
Stephanie OT calling from [facility] is calling to orders for OT [place] OT 1 time a week for 6 weeks. Cb- [phone] Verbal ok on VM

## 2022-03-11 ENCOUNTER — Emergency Department (HOSPITAL_COMMUNITY)
Admission: EM | Admit: 2022-03-11 | Discharge: 2022-03-11 | Discharge status: Against Medical Advice | Payer: Medicare Other | Attending: Emergency Medicine | Admitting: Emergency Medicine

## 2022-03-11 ENCOUNTER — Emergency Department (HOSPITAL_COMMUNITY): Payer: Medicare Other

## 2022-03-11 ENCOUNTER — Inpatient Hospital Stay: Payer: Medicare Other | Attending: Internal Medicine

## 2022-03-11 ENCOUNTER — Other Ambulatory Visit: Payer: Self-pay

## 2022-03-11 DIAGNOSIS — R0602 Shortness of breath: Secondary | ICD-10-CM | POA: Insufficient documentation

## 2022-03-11 DIAGNOSIS — D500 Iron deficiency anemia secondary to blood loss (chronic): Secondary | ICD-10-CM | POA: Insufficient documentation

## 2022-03-11 DIAGNOSIS — Z5321 Procedure and treatment not carried out due to patient leaving prior to being seen by health care provider: Secondary | ICD-10-CM | POA: Insufficient documentation

## 2022-03-11 DIAGNOSIS — K922 Gastrointestinal hemorrhage, unspecified: Secondary | ICD-10-CM | POA: Insufficient documentation

## 2022-03-11 DIAGNOSIS — R0789 Other chest pain: Secondary | ICD-10-CM | POA: Insufficient documentation

## 2022-03-11 DIAGNOSIS — C220 Liver cell carcinoma: Secondary | ICD-10-CM | POA: Insufficient documentation

## 2022-03-11 DIAGNOSIS — Z992 Dependence on renal dialysis: Secondary | ICD-10-CM | POA: Diagnosis not present

## 2022-03-11 LAB — BASIC METABOLIC PANEL
Anion gap: 10 (ref 5–15)
BUN: 25 mg/dL — ABNORMAL HIGH (ref 8–23)
CO2: 28 mmol/L (ref 22–32)
Calcium: 8.5 mg/dL — ABNORMAL LOW (ref 8.9–10.3)
Chloride: 93 mmol/L — ABNORMAL LOW (ref 98–111)
Creatinine, Ser: 5.47 mg/dL — ABNORMAL HIGH (ref 0.61–1.24)
GFR, Estimated: 11 mL/min — ABNORMAL LOW (ref >60)
Glucose, Bld: 108 mg/dL — ABNORMAL HIGH (ref 70–99)
Potassium: 4 mmol/L (ref 3.5–5.1)
Sodium: 131 mmol/L — ABNORMAL LOW (ref 135–145)

## 2022-03-11 LAB — CBC
HCT: 23 % — ABNORMAL LOW (ref 39.0–52.0)
Hemoglobin: 7.5 g/dL — ABNORMAL LOW (ref 13.0–17.0)
MCH: 33 pg (ref 26.0–34.0)
MCHC: 32.6 g/dL (ref 30.0–36.0)
MCV: 101.3 fL — ABNORMAL HIGH (ref 80.0–100.0)
Platelets: 132 10*3/uL — ABNORMAL LOW (ref 150–400)
RBC: 2.27 MIL/uL — ABNORMAL LOW (ref 4.22–5.81)
RDW: 16.7 % — ABNORMAL HIGH (ref 11.5–15.5)
WBC: 4.5 10*3/uL (ref 4.0–10.5)
nRBC: 0 % (ref 0.0–0.2)

## 2022-03-11 LAB — TROPONIN I (HIGH SENSITIVITY)
Troponin I (High Sensitivity): 15 ng/L (ref <18)
Troponin I (High Sensitivity): 16 ng/L (ref <18)

## 2022-03-11 NOTE — ED Notes (Signed)
Pt called for VS x3 no response

## 2022-03-11 NOTE — ED Notes (Signed)
Called pt for VS check. No response.

## 2022-03-11 NOTE — Telephone Encounter (Signed)
Noted will forward to provider to make aware

## 2022-03-11 NOTE — ED Notes (Signed)
Patient called for updated vitals x2 with no response

## 2022-03-11 NOTE — ED Provider Triage Note (Signed)
Emergency Medicine Provider Triage Evaluation Note  [name] , a 61 y.o. male  was evaluated in triage.  Pt complains of left-sided chest pain onset 2-3 days.  Has associated shortness of breath.  No meds tried prior to arrival.  Patient does dialysis Monday, Wednesday, Friday.  Review of Systems  Positive:  Negative:   Physical Exam  BP 139/70 (BP Location: Right Arm)   Pulse 97   Temp 98.2 F (36.8 C)   Resp (!) 24   SpO2 96%  Gen:   Awake, no distress   Resp:  Normal effort  MSK:   Moves extremities without difficulty  Other:  Left-sided chest wall tenderness to palpation.  Medical Decision Making  Medically screening exam initiated at 10:37 AM.  Appropriate orders placed.  Radford C Niemczyk was informed that the remainder of the evaluation will be completed by another provider, this initial triage assessment does not replace that evaluation, and the importance of remaining in the ED until their evaluation is complete.  Work-up initiated   Sneijder Bernards A, PA-C 03/11/22 1039

## 2022-03-11 NOTE — ED Triage Notes (Signed)
Pt. Stated, Ive had chest pain with SOB with neck pain and Ive got some swelling in my foot, legs and ankle. This started 3 days ago.

## 2022-03-11 NOTE — Telephone Encounter (Signed)
Copied from [place] [id number]. Topic: General - Other >> Mar 11, 2022 10:11 AM Cyndi Bender wrote: Reason for CRM: Elmyra Ricks with [facility] reports that she sent pt to the hospital due to chest pain and blood pressure reading of 172/80. Cb# [phone]

## 2022-03-12 ENCOUNTER — Telehealth: Payer: Self-pay

## 2022-03-12 ENCOUNTER — Ambulatory Visit (INDEPENDENT_AMBULATORY_CARE_PROVIDER_SITE_OTHER): Payer: Self-pay | Admitting: *Deleted

## 2022-03-12 NOTE — Telephone Encounter (Signed)
     Patient  visit on 11/11  at [facility]   Have you been able to follow up with your primary care physician? Yes   The patient was or was not able to obtain any needed medicine or equipment. Yes    Are there diet recommendations that you are having difficulty following? Na   Patient expresses understanding of discharge instructions and education provided has no other needs at this time.   Yes     [place], [facility], Care Management  [phone] [address], [place], [place] [address] Phone: [phone] Email: [email or web address]

## 2022-03-12 NOTE — Telephone Encounter (Signed)
Returned [place] call and provided verbal orders

## 2022-03-12 NOTE — Addendum Note (Signed)
Encounter addended by: Janine Limbo, RT on: 03/12/2022 10:24 AM  Actions taken: Imaging Exam ended

## 2022-03-12 NOTE — Telephone Encounter (Signed)
  Chief Complaint: cough nonproductive , shortness of breath at rest. Not sleeping in 3 days, neck pain Symptoms: cough , unable to cough up phlegm, shortness of breath at rest and esp with activity . Reports being in HD now. C/o pain in back of neck. Has not slept in 3 days . Went to ED and waited x 12 hours and could not wait any longer.  Frequency: cough approx. 1 month Pertinent Negatives: Patient denies chest pain no fever,  Disposition: '[x]'$ ED /'[]'$ Urgent Care (no appt availability in office) / '[]'$ Appointment(In office/virtual)/ '[]'$  [facility]/ '[]'$ Home Care/ '[x]'$ Refused Recommended Disposition /'[]'$ [facility]/ '[]'$  Follow-up with PCP Additional Notes:   Recommended ED . Patient reports he does not want to go back to ED due to wait times. Reports trazodone does not help with sleep. Please advise.     Reason for Disposition  [1] MODERATE difficulty breathing (e.g., speaks in phrases, SOB even at rest, pulse 100-120) AND [2] still present when not coughing  Answer Assessment - Initial Assessment Questions 1. ONSET: "When did the cough begin?"      Almost 1 month  2. SEVERITY: "How bad is the cough today?"      Getting worse  3. SPUTUM: "Describe the color of your sputum" (none, dry cough; clear, white, yellow, green)     No, wont come up  4. HEMOPTYSIS: "Are you coughing up any blood?" If so ask: "How much?" (flecks, streaks, tablespoons, etc.)     no 5. DIFFICULTY BREATHING: "Are you having difficulty breathing?" If Yes, ask: "How bad is it?" (e.g., mild, moderate, severe)    - MILD: No SOB at rest, mild SOB with walking, speaks normally in sentences, can lie down, no retractions, pulse < 100.    - MODERATE: SOB at rest, SOB with minimal exertion and prefers to sit, cannot lie down flat, speaks in phrases, mild retractions, audible wheezing, pulse 100-120.    - SEVERE: Very SOB at rest, speaks in single words, struggling to breathe, sitting hunched forward, retractions,  pulse > 120      Moderate  6. FEVER: "Do you have a fever?" If Yes, ask: "What is your temperature, how was it measured, and when did it start?"     na 7. CARDIAC HISTORY: "Do you have any history of heart disease?" (e.g., heart attack, congestive heart failure)      See hx  8. LUNG HISTORY: "Do you have any history of lung disease?"  (e.g., pulmonary embolus, asthma, emphysema)     See hx  9. PE RISK FACTORS: "Do you have a history of blood clots?" (or: recent major surgery, recent prolonged travel, bedridden)     na 10. OTHER SYMPTOMS: "Do you have any other symptoms?" (e.g., runny nose, wheezing, chest pain)       Cough non productive, "can't cough it up" . Shortness of breath at rest. Not slept in 3 days  11. PREGNANCY: "Is there any chance you are pregnant?" "When was your last menstrual period?"       na 12. TRAVEL: "Have you traveled out of the country in the last month?" (e.g., travel history, exposures)       na  Protocols used: Cough - Acute Non-Productive-A-AH

## 2022-03-13 ENCOUNTER — Other Ambulatory Visit: Payer: Self-pay | Admitting: Lab

## 2022-03-13 ENCOUNTER — Telehealth: Payer: Self-pay | Admitting: *Deleted

## 2022-03-13 ENCOUNTER — Inpatient Hospital Stay: Payer: Medicare Other

## 2022-03-13 ENCOUNTER — Other Ambulatory Visit: Payer: Self-pay

## 2022-03-13 DIAGNOSIS — K921 Melena: Secondary | ICD-10-CM

## 2022-03-13 DIAGNOSIS — C220 Liver cell carcinoma: Secondary | ICD-10-CM | POA: Diagnosis not present

## 2022-03-13 DIAGNOSIS — D500 Iron deficiency anemia secondary to blood loss (chronic): Secondary | ICD-10-CM | POA: Diagnosis not present

## 2022-03-13 DIAGNOSIS — D631 Anemia in chronic kidney disease: Secondary | ICD-10-CM

## 2022-03-13 DIAGNOSIS — K922 Gastrointestinal hemorrhage, unspecified: Secondary | ICD-10-CM | POA: Diagnosis present

## 2022-03-13 DIAGNOSIS — K31819 Angiodysplasia of stomach and duodenum without bleeding: Secondary | ICD-10-CM

## 2022-03-13 LAB — COMPREHENSIVE METABOLIC PANEL
ALT: 14 U/L (ref 0–44)
AST: 60 U/L — ABNORMAL HIGH (ref 15–41)
Albumin: 2.6 g/dL — ABNORMAL LOW (ref 3.5–5.0)
Alkaline Phosphatase: 127 U/L — ABNORMAL HIGH (ref 38–126)
Anion gap: 8 (ref 5–15)
BUN: 22 mg/dL (ref 8–23)
CO2: 31 mmol/L (ref 22–32)
Calcium: 8.3 mg/dL — ABNORMAL LOW (ref 8.9–10.3)
Chloride: 94 mmol/L — ABNORMAL LOW (ref 98–111)
Creatinine, Ser: 4.44 mg/dL (ref 0.61–1.24)
GFR, Estimated: 14 mL/min — ABNORMAL LOW (ref >60)
Glucose, Bld: 97 mg/dL (ref 70–99)
Potassium: 3.6 mmol/L (ref 3.5–5.1)
Sodium: 133 mmol/L — ABNORMAL LOW (ref 135–145)
Total Bilirubin: 1 mg/dL (ref 0.3–1.2)
Total Protein: 8.7 g/dL — ABNORMAL HIGH (ref 6.5–8.1)

## 2022-03-13 LAB — CBC WITH DIFFERENTIAL/PLATELET
Abs Immature Granulocytes: 0.02 10*3/uL (ref 0.00–0.07)
Basophils Absolute: 0 10*3/uL (ref 0.0–0.1)
Basophils Relative: 1 %
Eosinophils Absolute: 0.1 10*3/uL (ref 0.0–0.5)
Eosinophils Relative: 1 %
HCT: 21.7 % — ABNORMAL LOW (ref 39.0–52.0)
Hemoglobin: 7.2 g/dL — ABNORMAL LOW (ref 13.0–17.0)
Immature Granulocytes: 1 %
Lymphocytes Relative: 21 %
Lymphs Abs: 0.9 10*3/uL (ref 0.7–4.0)
MCH: 32.9 pg (ref 26.0–34.0)
MCHC: 33.2 g/dL (ref 30.0–36.0)
MCV: 99.1 fL (ref 80.0–100.0)
Monocytes Absolute: 0.8 10*3/uL (ref 0.1–1.0)
Monocytes Relative: 18 %
Neutro Abs: 2.6 10*3/uL (ref 1.7–7.7)
Neutrophils Relative %: 58 %
Platelets: 124 10*3/uL — ABNORMAL LOW (ref 150–400)
RBC: 2.19 MIL/uL — ABNORMAL LOW (ref 4.22–5.81)
RDW: 16.6 % — ABNORMAL HIGH (ref 11.5–15.5)
WBC: 4.4 10*3/uL (ref 4.0–10.5)
nRBC: 0 % (ref 0.0–0.2)

## 2022-03-13 LAB — SAMPLE TO BLOOD BANK

## 2022-03-13 LAB — FERRITIN: Ferritin: 1120 ng/mL — ABNORMAL HIGH (ref 24–336)

## 2022-03-13 LAB — PREPARE RBC (CROSSMATCH)

## 2022-03-13 NOTE — Telephone Encounter (Signed)
CRITICAL VALUE STICKER  CRITICAL VALUE: Creatine 4.44  RECEIVER (on-site recipient of call): Kim RN  DATE & TIME NOTIFIED: 03/13/2022 @ 10:30 am  MESSENGER (representative from lab): Hillary  MD NOTIFIED: Dr Burr Medico and nursing staff  TIME OF NOTIFICATION: 10:32 am  RESPONSE: aware, chronic for patient

## 2022-03-13 NOTE — Progress Notes (Signed)
Patient to receive 1 unit PRBC's on 03/15/22. Spoke with patient's sister, Laurence Ferrari, to give time and date for Saturday infusion. She will remind patient to leave his blue bracelet on until infusion.  Left 2 VM's on patient's number with same information.

## 2022-03-13 NOTE — Telephone Encounter (Signed)
Contacted patient to get more information. Pt states he he has a cough that has not gone away and he has not be able to sleep. Pt states at times he has SOB. While talking to pt I heard some wheezing. I asked pt has he been having some wheezing patient stated yes he has. Went and provided Sharyn Lull this info to see what she recommend for the patient. Per Sharyn Lull she would like pt to seen at the ED or Urgent Care. Made pt aware of this information pt states it will be a while before he goes because he currently at dialysis. Per Sharyn Lull spoke to pt and asked pt if their was a doctor there and he asked a nurse and they stated there wasn't any there. Per Sharyn Lull she is going to contact the place where is at for Dialysis. Asked pt where is going for Dialysis pt states the place is locate [facility]. I looked up the information the pt provided and pt is currently receiving Dialysis at [facility]. Made pt aware that we going to call up there and speak to someone. Per pt he stated okay and hung up. [place] and spoke to [place]. Per Sharyn Lull this pt first appt with them. Made Sharyn Lull aware of the situation and she went to go check on pt and that is when pt stated his concerns. Per Sharyn Lull they placed pt on o2 and they check bp every 30 mins. Per Sharyn Lull if pt needs more o2 they up the level. Per Sharyn Lull if things get worst they will send pt out.

## 2022-03-13 NOTE — Telephone Encounter (Signed)
CRITICAL VALUE STICKER  CRITICAL VALUE: HGB 7.2  RECEIVER (on-site recipient of call): Georgina Pillion, [place] NOTIFIED: 03/13/22; 2355  MESSENGER (representative from lab): [facility]  MD NOTIFIED: [name]  TIME OF NOTIFICATION: 1000  RESPONSE: order received to schedule for 1 unit PRBCs Saturday 11/18

## 2022-03-14 ENCOUNTER — Ambulatory Visit (INDEPENDENT_AMBULATORY_CARE_PROVIDER_SITE_OTHER): Payer: Self-pay | Admitting: *Deleted

## 2022-03-14 NOTE — Telephone Encounter (Signed)
  Chief Complaint: insomnia Symptoms: patient states he is able to fall asleep- but wakes after 2 hours and can not go back to sleep Frequency: 8-9 days Pertinent Negatives: Patient denies stress or caffeine,drugs, alcohol Disposition: '[]'$ ED /'[]'$ Urgent Care (no appt availability in office) / '[x]'$ Appointment(In office/virtual)/ '[]'$  [facility]/ '[]'$ Home Care/ '[]'$ Refused Recommended Disposition /'[]'$ [facility]/ '[]'$  Follow-up with PCP Additional Notes: Call to office- scheduled as soon as possible with assistance.  Reason for Disposition  [1] Insomnia persists > 1 week AND [2] no improvement after using Care Advice  Answer Assessment - Initial Assessment Questions 1. DESCRIPTION: "Tell me about your sleeping problem."      Falls asleep- but wakes unable to go back to sleep- only sleeping 2 hours 2. ONSET: "How long have you been having trouble sleeping?" (e.g., days, weeks, months)     8-9 days- worsening 3. RECURRENT: "Have you had sleeping problems before?"  If Yes, ask: "What happened that time?" "What helped your sleeping problem go away in the past?"      Never treated 4. STRESS: "Is there anything in your life that is making you feel stressed or tense?"     no 5. PAIN: "Do you have any pain that is keeping you awake?" (e.g., back pain, headache, abdomen pain)     no 6. CAFFEINE ABUSE: "Do you drink caffeinated beverages, and how much each day?" (e.g., coffee, tea, colas)     no 7. ALCOHOL USE OR SUBSTANCE USE (DRUG USE): "Do you drink alcohol or use any illegal drugs?"     no 8. OTHER SYMPTOMS: "Do you have any other symptoms?"  (e.g., difficulty breathing)     SOB- trouble breathing with laying down- does get better  Protocols used: Insomnia-A-AH

## 2022-03-15 ENCOUNTER — Telehealth: Payer: Self-pay | Admitting: *Deleted

## 2022-03-15 ENCOUNTER — Inpatient Hospital Stay: Payer: Medicare Other

## 2022-03-15 LAB — AFP TUMOR MARKER: AFP, Serum, Tumor Marker: 6.1 ng/mL (ref 0.0–8.4)

## 2022-03-15 NOTE — Telephone Encounter (Signed)
Called pt to make aware of blood transfusion appointment scheduled at 11. Pt stated that he was not aware of blood transfusion appt today and did not receive a voicemail or was made aware by his sister [place]. Pt could not make it in time to start blood transfusion. Advised pt to call office Monday to reschedule. Also advised pt to go to ED if he becomes symptomatic. Pt verbalized understanding.

## 2022-03-17 LAB — TYPE AND SCREEN
ABO/RH(D): B POS
Antibody Screen: NEGATIVE
Unit division: 0
Unit division: 0

## 2022-03-17 LAB — BPAM RBC
Blood Product Expiration Date: 202311302359
Blood Product Expiration Date: 202311302359
ISSUE DATE / TIME: 202311091655
Unit Type and Rh: 7300
Unit Type and Rh: 7300

## 2022-03-18 ENCOUNTER — Encounter: Payer: Self-pay | Admitting: *Deleted

## 2022-03-23 ENCOUNTER — Inpatient Hospital Stay (HOSPITAL_COMMUNITY)
Admission: EM | Admit: 2022-03-23 | Discharge: 2022-03-29 | DRG: 640 | Disposition: A | Discharge status: Home Health | Payer: Medicare Other | Attending: Internal Medicine | Admitting: Internal Medicine

## 2022-03-23 ENCOUNTER — Emergency Department (HOSPITAL_COMMUNITY): Payer: Medicare Other

## 2022-03-23 ENCOUNTER — Observation Stay (HOSPITAL_COMMUNITY): Payer: Medicare Other

## 2022-03-23 ENCOUNTER — Other Ambulatory Visit: Payer: Self-pay

## 2022-03-23 DIAGNOSIS — R062 Wheezing: Secondary | ICD-10-CM | POA: Diagnosis not present

## 2022-03-23 DIAGNOSIS — G8929 Other chronic pain: Secondary | ICD-10-CM | POA: Diagnosis present

## 2022-03-23 DIAGNOSIS — Z8673 Personal history of transient ischemic attack (TIA), and cerebral infarction without residual deficits: Secondary | ICD-10-CM

## 2022-03-23 DIAGNOSIS — M898X9 Other specified disorders of bone, unspecified site: Secondary | ICD-10-CM | POA: Diagnosis present

## 2022-03-23 DIAGNOSIS — C220 Liver cell carcinoma: Secondary | ICD-10-CM | POA: Diagnosis present

## 2022-03-23 DIAGNOSIS — Z885 Allergy status to narcotic agent status: Secondary | ICD-10-CM

## 2022-03-23 DIAGNOSIS — E871 Hypo-osmolality and hyponatremia: Secondary | ICD-10-CM | POA: Diagnosis present

## 2022-03-23 DIAGNOSIS — Z888 Allergy status to other drugs, medicaments and biological substances status: Secondary | ICD-10-CM

## 2022-03-23 DIAGNOSIS — J9601 Acute respiratory failure with hypoxia: Secondary | ICD-10-CM | POA: Diagnosis present

## 2022-03-23 DIAGNOSIS — Z89512 Acquired absence of left leg below knee: Secondary | ICD-10-CM

## 2022-03-23 DIAGNOSIS — E1122 Type 2 diabetes mellitus with diabetic chronic kidney disease: Secondary | ICD-10-CM | POA: Diagnosis present

## 2022-03-23 DIAGNOSIS — E1151 Type 2 diabetes mellitus with diabetic peripheral angiopathy without gangrene: Secondary | ICD-10-CM | POA: Diagnosis present

## 2022-03-23 DIAGNOSIS — F17210 Nicotine dependence, cigarettes, uncomplicated: Secondary | ICD-10-CM | POA: Diagnosis present

## 2022-03-23 DIAGNOSIS — F17200 Nicotine dependence, unspecified, uncomplicated: Secondary | ICD-10-CM | POA: Diagnosis present

## 2022-03-23 DIAGNOSIS — E8770 Fluid overload, unspecified: Secondary | ICD-10-CM | POA: Diagnosis not present

## 2022-03-23 DIAGNOSIS — J90 Pleural effusion, not elsewhere classified: Secondary | ICD-10-CM | POA: Diagnosis present

## 2022-03-23 DIAGNOSIS — N289 Disorder of kidney and ureter, unspecified: Secondary | ICD-10-CM

## 2022-03-23 DIAGNOSIS — E782 Mixed hyperlipidemia: Secondary | ICD-10-CM | POA: Diagnosis present

## 2022-03-23 DIAGNOSIS — L89152 Pressure ulcer of sacral region, stage 2: Secondary | ICD-10-CM | POA: Diagnosis present

## 2022-03-23 DIAGNOSIS — Z91158 Patient's noncompliance with renal dialysis for other reason: Secondary | ICD-10-CM

## 2022-03-23 DIAGNOSIS — M549 Dorsalgia, unspecified: Secondary | ICD-10-CM | POA: Diagnosis present

## 2022-03-23 DIAGNOSIS — Z79899 Other long term (current) drug therapy: Secondary | ICD-10-CM

## 2022-03-23 DIAGNOSIS — E118 Type 2 diabetes mellitus with unspecified complications: Secondary | ICD-10-CM | POA: Diagnosis present

## 2022-03-23 DIAGNOSIS — K7030 Alcoholic cirrhosis of liver without ascites: Secondary | ICD-10-CM | POA: Diagnosis present

## 2022-03-23 DIAGNOSIS — J811 Chronic pulmonary edema: Secondary | ICD-10-CM | POA: Diagnosis present

## 2022-03-23 DIAGNOSIS — Z1152 Encounter for screening for COVID-19: Secondary | ICD-10-CM

## 2022-03-23 DIAGNOSIS — Z992 Dependence on renal dialysis: Secondary | ICD-10-CM

## 2022-03-23 DIAGNOSIS — N2581 Secondary hyperparathyroidism of renal origin: Secondary | ICD-10-CM | POA: Diagnosis present

## 2022-03-23 DIAGNOSIS — N186 End stage renal disease: Secondary | ICD-10-CM | POA: Diagnosis present

## 2022-03-23 DIAGNOSIS — D631 Anemia in chronic kidney disease: Secondary | ICD-10-CM | POA: Diagnosis present

## 2022-03-23 LAB — COMPREHENSIVE METABOLIC PANEL
ALT: 17 U/L (ref 0–44)
AST: 57 U/L — ABNORMAL HIGH (ref 15–41)
Albumin: 1.9 g/dL — ABNORMAL LOW (ref 3.5–5.0)
Alkaline Phosphatase: 122 U/L (ref 38–126)
Anion gap: 12 (ref 5–15)
BUN: 29 mg/dL — ABNORMAL HIGH (ref 8–23)
CO2: 26 mmol/L (ref 22–32)
Calcium: 8.9 mg/dL (ref 8.9–10.3)
Chloride: 94 mmol/L — ABNORMAL LOW (ref 98–111)
Creatinine, Ser: 6.84 mg/dL — ABNORMAL HIGH (ref 0.61–1.24)
GFR, Estimated: 8 mL/min — ABNORMAL LOW (ref >60)
Glucose, Bld: 123 mg/dL — ABNORMAL HIGH (ref 70–99)
Potassium: 3.9 mmol/L (ref 3.5–5.1)
Sodium: 132 mmol/L — ABNORMAL LOW (ref 135–145)
Total Bilirubin: 0.9 mg/dL (ref 0.3–1.2)
Total Protein: 8.7 g/dL — ABNORMAL HIGH (ref 6.5–8.1)

## 2022-03-23 LAB — CBC WITH DIFFERENTIAL/PLATELET
Abs Immature Granulocytes: 0.03 10*3/uL (ref 0.00–0.07)
Basophils Absolute: 0 10*3/uL (ref 0.0–0.1)
Basophils Relative: 1 %
Eosinophils Absolute: 0.1 10*3/uL (ref 0.0–0.5)
Eosinophils Relative: 2 %
HCT: 24.1 % — ABNORMAL LOW (ref 39.0–52.0)
Hemoglobin: 7.7 g/dL — ABNORMAL LOW (ref 13.0–17.0)
Immature Granulocytes: 1 %
Lymphocytes Relative: 21 %
Lymphs Abs: 1.1 10*3/uL (ref 0.7–4.0)
MCH: 33.8 pg (ref 26.0–34.0)
MCHC: 32 g/dL (ref 30.0–36.0)
MCV: 105.7 fL — ABNORMAL HIGH (ref 80.0–100.0)
Monocytes Absolute: 0.9 10*3/uL (ref 0.1–1.0)
Monocytes Relative: 17 %
Neutro Abs: 3.1 10*3/uL (ref 1.7–7.7)
Neutrophils Relative %: 58 %
Platelets: 123 10*3/uL — ABNORMAL LOW (ref 150–400)
RBC: 2.28 MIL/uL — ABNORMAL LOW (ref 4.22–5.81)
RDW: 19.1 % — ABNORMAL HIGH (ref 11.5–15.5)
WBC: 5.2 10*3/uL (ref 4.0–10.5)
nRBC: 0 % (ref 0.0–0.2)

## 2022-03-23 LAB — RESP PANEL BY RT-PCR (FLU A&B, COVID) ARPGX2
Influenza A by PCR: NEGATIVE
Influenza B by PCR: NEGATIVE
SARS Coronavirus 2 by RT PCR: NEGATIVE

## 2022-03-23 LAB — HEPATITIS B SURFACE ANTIGEN: Hepatitis B Surface Ag: NONREACTIVE

## 2022-03-23 MED ORDER — FUROSEMIDE 10 MG/ML IJ SOLN
120.0000 mg | Freq: Once | INTRAVENOUS | Status: AC
  Administered 2022-03-23: 120 mg via INTRAVENOUS
  Filled 2022-03-23: qty 10

## 2022-03-23 MED ORDER — CAMPHOR-MENTHOL 0.5-0.5 % EX LOTN: 1.0000 | TOPICAL_LOTION | Freq: Three times a day (TID) | CUTANEOUS | Status: DC | PRN

## 2022-03-23 MED ORDER — HYDROXYZINE HCL 25 MG PO TABS: 25.0000 mg | ORAL_TABLET | Freq: Three times a day (TID) | ORAL | Status: DC | PRN

## 2022-03-23 MED ORDER — ONDANSETRON HCL 4 MG PO TABS: 4.0000 mg | ORAL_TABLET | Freq: Four times a day (QID) | ORAL | Status: DC | PRN

## 2022-03-23 MED ORDER — SODIUM CHLORIDE 0.9% FLUSH
3.0000 mL | Freq: Two times a day (BID) | INTRAVENOUS | Status: DC
  Administered 2022-03-24 – 2022-03-29 (×10): 3 mL via INTRAVENOUS

## 2022-03-23 MED ORDER — CHLORHEXIDINE GLUCONATE CLOTH 2 % EX PADS
6.0000 | MEDICATED_PAD | Freq: Every day | CUTANEOUS | Status: DC
  Administered 2022-03-25: 6 via TOPICAL

## 2022-03-23 MED ORDER — FUROSEMIDE 10 MG/ML IJ SOLN: 80.0000 mg | Freq: Once | INTRAMUSCULAR | Status: DC

## 2022-03-23 MED ORDER — NEPRO/CARBSTEADY PO LIQD: 237.0000 mL | Freq: Three times a day (TID) | ORAL | Status: DC | PRN

## 2022-03-23 MED ORDER — HEPARIN SODIUM (PORCINE) 5000 UNIT/ML IJ SOLN
5000.0000 [IU] | Freq: Three times a day (TID) | INTRAMUSCULAR | Status: DC
  Administered 2022-03-23 – 2022-03-29 (×15): 5000 [IU] via SUBCUTANEOUS
  Filled 2022-03-23 (×17): qty 1

## 2022-03-23 MED ORDER — AMLODIPINE BESYLATE 5 MG PO TABS
5.0000 mg | ORAL_TABLET | Freq: Every day | ORAL | Status: DC
  Administered 2022-03-25 – 2022-03-29 (×5): 5 mg via ORAL
  Filled 2022-03-23 (×5): qty 1

## 2022-03-23 MED ORDER — NICOTINE 14 MG/24HR TD PT24: 14.0000 mg | MEDICATED_PATCH | Freq: Every day | TRANSDERMAL | Status: DC | PRN

## 2022-03-23 MED ORDER — ACETAMINOPHEN 650 MG RE SUPP: 650.0000 mg | Freq: Four times a day (QID) | RECTAL | Status: DC | PRN

## 2022-03-23 MED ORDER — HYDRALAZINE HCL 20 MG/ML IJ SOLN: 5.0000 mg | INTRAMUSCULAR | Status: DC | PRN

## 2022-03-23 MED ORDER — ONDANSETRON HCL 4 MG/2ML IJ SOLN
4.0000 mg | Freq: Four times a day (QID) | INTRAMUSCULAR | Status: DC | PRN
  Administered 2022-03-28: 4 mg via INTRAVENOUS
  Filled 2022-03-23: qty 2

## 2022-03-23 MED ORDER — PANTOPRAZOLE SODIUM 40 MG PO TBEC
40.0000 mg | DELAYED_RELEASE_TABLET | Freq: Two times a day (BID) | ORAL | Status: DC
  Administered 2022-03-23 – 2022-03-29 (×11): 40 mg via ORAL
  Filled 2022-03-23 (×11): qty 1

## 2022-03-23 MED ORDER — GABAPENTIN 300 MG PO CAPS
300.0000 mg | ORAL_CAPSULE | Freq: Three times a day (TID) | ORAL | Status: DC
  Administered 2022-03-23 – 2022-03-29 (×16): 300 mg via ORAL
  Filled 2022-03-23 (×16): qty 1

## 2022-03-23 MED ORDER — ALBUTEROL SULFATE (2.5 MG/3ML) 0.083% IN NEBU
2.5000 mg | INHALATION_SOLUTION | RESPIRATORY_TRACT | Status: DC | PRN
  Administered 2022-03-23 – 2022-03-24 (×6): 2.5 mg via RESPIRATORY_TRACT
  Administered 2022-03-25: 5 mg via RESPIRATORY_TRACT
  Administered 2022-03-25 – 2022-03-26 (×3): 2.5 mg via RESPIRATORY_TRACT
  Filled 2022-03-23 (×9): qty 3

## 2022-03-23 MED ORDER — DOCUSATE SODIUM 283 MG RE ENEM: 1.0000 | ENEMA | RECTAL | Status: DC | PRN

## 2022-03-23 MED ORDER — CALCIUM CARBONATE ANTACID 1250 MG/5ML PO SUSP: 500.0000 mg | Freq: Four times a day (QID) | ORAL | Status: DC | PRN

## 2022-03-23 MED ORDER — ZOLPIDEM TARTRATE 5 MG PO TABS
5.0000 mg | ORAL_TABLET | Freq: Every evening | ORAL | Status: DC | PRN
  Administered 2022-03-23 – 2022-03-26 (×4): 5 mg via ORAL
  Filled 2022-03-23 (×4): qty 1

## 2022-03-23 MED ORDER — ACETAMINOPHEN 325 MG PO TABS: 650.0000 mg | ORAL_TABLET | Freq: Four times a day (QID) | ORAL | Status: DC | PRN

## 2022-03-23 MED ORDER — SORBITOL 70 % SOLN: 30.0000 mL | Status: DC | PRN

## 2022-03-23 MED ORDER — IPRATROPIUM-ALBUTEROL 0.5-2.5 (3) MG/3ML IN SOLN
3.0000 mL | Freq: Once | RESPIRATORY_TRACT | Status: AC
  Administered 2022-03-23: 3 mL via RESPIRATORY_TRACT
  Filled 2022-03-23: qty 3

## 2022-03-23 MED ORDER — ATORVASTATIN CALCIUM 40 MG PO TABS
40.0000 mg | ORAL_TABLET | Freq: Every day | ORAL | Status: DC
  Administered 2022-03-25 – 2022-03-29 (×5): 40 mg via ORAL
  Filled 2022-03-23 (×5): qty 1

## 2022-03-23 MED ORDER — TRAZODONE HCL 50 MG PO TABS
50.0000 mg | ORAL_TABLET | Freq: Every day | ORAL | Status: DC | PRN
  Administered 2022-03-25 – 2022-03-28 (×4): 50 mg via ORAL
  Filled 2022-03-23 (×5): qty 1

## 2022-03-23 MED ORDER — AMLODIPINE BESYLATE 5 MG PO TABS
10.0000 mg | ORAL_TABLET | Freq: Once | ORAL | Status: AC
  Administered 2022-03-23: 10 mg via ORAL
  Filled 2022-03-23: qty 2

## 2022-03-23 NOTE — ED Notes (Signed)
Pt c/o increased sob and cp, EKG obtained, RT and MD notified.

## 2022-03-23 NOTE — ED Triage Notes (Signed)
Patient reports bilateral lower legs swelling onset this week , hemodialysis treatment last Friday , generalized weakness/fatigue with mild SOB , denies fever or chills .

## 2022-03-23 NOTE — ED Provider Notes (Signed)
[facility] EMERGENCY DEPARTMENT Provider Note   CSN: [id number] Arrival date & time: 03/23/22  0533     History  Chief Complaint  Patient presents with   Legs Swelling    Daniel [name] is a 61 y.o. male.  With complex medical history including ESRD on HD MWF, anemia of chronic disease, PAD, DM 2, HTN presenting with worsening dyspnea at rest and lower extremity swelling.  He said his last session of dialysis was this past Friday and he had a full 4-hour session.  He is unsure what his dry weight is or how much they take off.  He denies any changes in fluid or food intake this past weekend even with the holidays.  He denies any active smoking or alcohol use.  He has had a cough that is been nonproductive associate with some congestion and rhinorrhea but no fevers or purulence.  He has had no vomiting or diarrhea.  He endorses chest tightness with increased wheezing.  He denies history of COPD or asthma and does not use any inhalers.  He still does make urine. Denies CP or abdominal pain, no vomiting or diarrhea.  HPI     Home Medications Prior to Admission medications   Medication Sig Start Date End Date Taking? Authorizing Provider  amLODipine (NORVASC) 5 MG tablet Take 1 tablet (5 mg total) by mouth daily. 02/10/22 05/11/22  [name], NP  atorvastatin (LIPITOR) 40 MG tablet Take 1 tablet (40 mg total) by mouth daily. 09/18/21 12/26/22  [place] Donnamarie Poag, DO  folic acid (FOLVITE) 1 MG tablet Take 1 tablet (1 mg total) by mouth daily. Patient not taking: Reported on 02/25/2022 12/12/21   Eugenie Filler, MD  gabapentin (NEURONTIN) 300 MG capsule Take 300 mg by mouth 3 (three) times daily. Patient not taking: Reported on 02/25/2022 02/07/22   [provider]  loperamide (IMODIUM) 2 MG capsule Take 1 capsule (2 mg total) by mouth 4 (four) times daily as needed for diarrhea or loose stools. 01/31/22   Carmin Muskrat, MD  Multiple Vitamin  (MULTIVITAMIN WITH MINERALS) TABS tablet Take 1 tablet by mouth daily. Patient not taking: Reported on 02/25/2022 12/12/21   Eugenie Filler, MD  pantoprazole (PROTONIX) 40 MG tablet Take 1 tablet (40 mg total) by mouth 2 (two) times daily before a meal. Patient not taking: Reported on 02/25/2022 10/10/21 04/05/22  Mercy Riding, MD  thiamine (VITAMIN B1) 100 MG tablet Take 1 tablet (100 mg total) by mouth daily. Patient not taking: Reported on 02/25/2022 12/12/21   Eugenie Filler, MD  traZODone (DESYREL) 50 MG tablet Take 1 tablet (50 mg total) by mouth daily as needed for sleep. Patient not taking: Reported on 02/25/2022 02/10/22   [name], NP  colchicine 0.6 MG tablet Take 0.5 tablets (0.3 mg total) by mouth 2 (two) times daily. 07/24/20 07/29/20  Noemi Chapel, MD  furosemide (LASIX) 40 MG tablet Take 1 tablet (40 mg total) by mouth daily. 07/25/19 02/09/20  Ladona Horns, MD      Allergies    Seroquel [quetiapine] and Dilaudid [hydromorphone hcl]    Review of Systems   Review of Systems  Physical Exam Updated Vital Signs BP (!) 143/123   Pulse 92   Temp 97.8 F (36.6 C) (Oral)   Resp 20   Ht '5\' 11"'$  (1.803 m)   Wt 96 kg   SpO2 98%   BMI 29.52 kg/m  Physical Exam Constitutional: Alert and oriented.  Chronically ill-appearing but no acute distress Eyes: Conjunctivae are normal. ENT      Head: Normocephalic and atraumatic.      Neck: No stridor. Cardiovascular: S1, S2, regular rate and rhythm Respiratory: Mildly tachypneic, diffuse end expiratory wheezing, decreased breath sounds at the bases satting low 90s on room air Gastrointestinal: Soft and nondistended Musculoskeletal: Normal range of motion in all extremities.      Right lower leg: 2+ pitting edema extending from foot to knee, nontender, nonerythematous      Left lower leg: Left BKA Neurologic: Normal speech and language. Skin: Skin is warm, dry  Psychiatric: Mood and affect are normal. Speech and  behavior are normal.  ED Results / Procedures / Treatments   Labs (all labs ordered are listed, but only abnormal results are displayed) Labs Reviewed  CBC WITH DIFFERENTIAL/PLATELET - Abnormal; Notable for the following components:      Result Value   RBC 2.28 (*)    Hemoglobin 7.7 (*)    HCT 24.1 (*)    MCV 105.7 (*)    RDW 19.1 (*)    Platelets 123 (*)    All other components within normal limits  COMPREHENSIVE METABOLIC PANEL - Abnormal; Notable for the following components:   Sodium 132 (*)    Chloride 94 (*)    Glucose, Bld 123 (*)    BUN 29 (*)    Creatinine, Ser 6.84 (*)    Total Protein 8.7 (*)    Albumin 1.9 (*)    AST 57 (*)    GFR, Estimated 8 (*)    All other components within normal limits  RESP PANEL BY RT-PCR (FLU A&B, COVID) [id number]  HEPATITIS B SURFACE ANTIGEN  HEPATITIS B SURFACE ANTIBODY, QUANTITATIVE    EKG None  Radiology DG Chest 2 View  Result Date: 03/23/2022 CLINICAL DATA:  Leg swelling and shortness of breath. EXAM: CHEST - 2 VIEW COMPARISON:  03/11/2022 FINDINGS: Stable cardiomediastinal contours. Lung volumes are low. Bilateral pleural effusions are noted right greater than left. Mild diffuse increase interstitial markings concerning for edema. Similar appearance of left mid and bilateral lower lobe opacities. IMPRESSION: 1. No change in aeration to the lungs compared with previous exam. 2. Persistent bilateral pleural effusions and interstitial edema. Electronically Signed   By: Kerby Moors M.D.   On: 03/23/2022 07:20    Procedures Procedures   Medications Ordered in ED Medications  Chlorhexidine Gluconate Cloth 2 % PADS 6 each (has no administration in time range)  ipratropium-albuterol (DUONEB) 0.5-2.5 (3) MG/3ML nebulizer solution 3 mL (3 mLs Nebulization Given 03/23/22 1440)  furosemide (LASIX) 120 mg in dextrose 5 % 50 mL IVPB (120 mg Intravenous New Bag/Given 03/23/22 1440)  amLODipine (NORVASC) tablet 10 mg (10 mg Oral Given  03/23/22 1438)    ED Course/ Medical Decision Making/ A&P                           Medical Decision Making Daniel [name] is a 61 y.o. male.  With complex medical history including ESRD on HD MWF, anemia of chronic disease, PAD, DM 2, HTN presenting with worsening dyspnea at rest and lower extremity swelling.   Patient has diffuse wheezing on exam with O2 sat in the low 90s on room air.  He does have lower extremity pitting edema of his right leg, left BKA.  Suspect wheezing and symptoms likely secondary to fluid overload and cardiac wheeze.  His chest x-ray which  I personally interpreted showed diffuse interstitial markings suggestive of pulmonary edema with bilateral lower lobe effusions.  Unlikely pneumonia with normal white blood cell count 5.2, no purulent sputum production and normal temperature 98 F.  There is no documented history of COPD and patient denying any active tobacco use however there is documented history.  Potassium is 3.9. BUN 29, Cr 6.84. Hgb 7.7 at baseline.  Discussed case with nephrologist Dr. Joelyn Oms, no indication for emergent dialysis but will see patient inpatient and plan for dialysis tomorrow.  Will give IV diuresis 120 mg IV Lasix.  Will also give DuoNeb.  Admitted to hospitalist Dr. Lorin Mercy for further management.   Amount and/or Complexity of Data Reviewed Labs: ordered. Radiology: ordered.  Risk Prescription drug management. Decision regarding hospitalization.   Final Clinical Impression(s) / ED Diagnoses Final diagnoses:  Chronic pulmonary edema  Wheezing    Rx / DC Orders ED Discharge Orders     None         Elgie Congo, MD 03/23/22 [phone]

## 2022-03-23 NOTE — Consult Note (Signed)
[facility] Renal Consultation Note    Indication for Consultation:  Management of ESRD/hemodialysis, anemia, hypertension/volume, and secondary hyperparathyroidism.  HPI: Daniel [name] is a 61 y.o. male with PMH including ESRD on dialysis, HTN, DM, PAD s/p BKA, hep C, and recurrent upper GI bleed who presented to the ED with leg swelling, weakness and mild shortness of breath. Patient has had multiple ED visits for similar complaints. He reports his legs have been swollen "for weeks." He also reports progressively worsening shortness of breath for the past 24 hours. Reports this is associated with a chronic cough and congestion of unclear duration. Reports epigastric pain which he has had previously. Hx of recurrent GI bleeds but no bleeding noted recently. Hgb 7.7 (improved), WBC 5.2, K+ 3.9, Cr 6.84, Ca 8.9, Alb 1.9. CXR with persistent pleural effusions and interstitial edema.   Patient last received HD on 03/21/22 but missed dialysis 11/21 and shortened treatment on 11/19. He left his last HD 3.8kg over his EDW. He demonstrates a poor concept of fluid management and reports he does not understand why his legs keep swelling. We discussed the importance of limiting PO fluids (including ice) and attending all HD treatments.   Past Medical History:  Diagnosis Date   Anemia    Diabetes mellitus without complication [facility] [place])    patient denies   Dialysis patient [facility])    End stage chronic kidney disease ([place])    Hypertension    ICH (intracerebral hemorrhage) ([place]) 05/20/2017   PAD (peripheral artery disease) ([facility])    Shoulder pain, left 06/28/2013   Past Surgical History:  Procedure Laterality Date   A/V FISTULAGRAM N/A 08/15/2020   Procedure: A/V FISTULAGRAM - Left Upper;  Surgeon: Cherre Robins, MD;  Location: [place] CV LAB;  Service: Cardiovascular;  Laterality: N/A;   ABDOMINAL AORTOGRAM W/LOWER EXTREMITY N/A 04/08/2021   Procedure: ABDOMINAL AORTOGRAM W/LOWER  EXTREMITY;  Surgeon: Waynetta Sandy, MD;  Location: [place] CV LAB;  Service: Cardiovascular;  Laterality: N/A;   AMPUTATION Left 04/16/2021   Procedure: LEFT BELOW KNEE AMPUTATION;  Surgeon: Angelia Mould, MD;  Location: [place];  Service: Vascular;  Laterality: Left;   AMPUTATION Left 06/17/2021   Procedure: REVISION AMPUTATION BELOW KNEE;  Surgeon: Broadus John, MD;  Location: [place];  Service: Vascular;  Laterality: Left;   APPENDECTOMY     APPLICATION OF WOUND VAC Left 06/17/2021   Procedure: APPLICATION OF WOUND VAC;  Surgeon: Broadus John, MD;  Location: [place];  Service: Vascular;  Laterality: Left;   AV FISTULA PLACEMENT Left 08/03/2019   Procedure: LEFT ARM ARTERIOVENOUS (AV) CEPHALIC  FISTULA CREATION;  Surgeon: Waynetta Sandy, MD;  Location: [place];  Service: Vascular;  Laterality: Left;   BIOPSY  06/30/2019   Procedure: BIOPSY;  Surgeon: Ronald Lobo, MD;  Location: [facility] ENDOSCOPY;  Service: Endoscopy;;   BIOPSY  08/02/2019   Procedure: BIOPSY;  Surgeon: Yetta Flock, MD;  Location: [facility] ENDOSCOPY;  Service: Gastroenterology;;   BIOPSY  02/08/2021   Procedure: BIOPSY;  Surgeon: Sharyn Creamer, MD;  Location: [facility] ENDOSCOPY;  Service: Gastroenterology;;   BIOPSY  02/24/2021   Procedure: BIOPSY;  Surgeon: [name], MD;  Location: [facility] ENDOSCOPY;  Service: Gastroenterology;;   BIOPSY  03/26/2021   Procedure: BIOPSY;  Surgeon: Lavena Bullion, DO;  Location: [place];  Service: Gastroenterology;;   BIOPSY  08/06/2021   Procedure: BIOPSY;  Surgeon: [name], MD;  Location: [place];  Service: Gastroenterology;;   BIOPSY  09/15/2021   Procedure: BIOPSY;  Surgeon: Sharyn Creamer, MD;  Location: [place];  Service: Gastroenterology;;   BRONCHIAL BIOPSY  12/26/2021   Procedure: BRONCHIAL BIOPSIES;  Surgeon: Juanito Doom, MD;  Location: [place];  Service: Cardiopulmonary;;   BRONCHIAL WASHINGS  12/26/2021    Procedure: BRONCHIAL WASHINGS;  Surgeon: Juanito Doom, MD;  Location: [place];  Service: Cardiopulmonary;;   COLONOSCOPY  01/23/2012   Procedure: COLONOSCOPY;  Surgeon: Danie Binder, MD;  [facility];  Service: Endoscopy;  Laterality: N/A;  11:10 AM   COLONOSCOPY WITH PROPOFOL N/A 06/30/2019   Procedure: COLONOSCOPY WITH PROPOFOL;  Surgeon: Ronald Lobo, MD;  Location: [place];  Service: Endoscopy;  Laterality: N/A;   ENTEROSCOPY N/A 08/02/2019   Procedure: ENTEROSCOPY;  Surgeon: Yetta Flock, MD;  Location: [facility] ENDOSCOPY;  Service: Gastroenterology;  Laterality: N/A;   ENTEROSCOPY N/A 02/24/2021   Procedure: ENTEROSCOPY;  Surgeon: [name], MD;  Location: [facility] ENDOSCOPY;  Service: Gastroenterology;  Laterality: N/A;   ENTEROSCOPY N/A 03/26/2021   Procedure: ENTEROSCOPY;  Surgeon: Lavena Bullion, DO;  Location: [place];  Service: Gastroenterology;  Laterality: N/A;   ENTEROSCOPY N/A 07/05/2021   Procedure: ENTEROSCOPY;  Surgeon: Carol Ada, MD;  Location: [place];  Service: Gastroenterology;  Laterality: N/A;   ENTEROSCOPY N/A 08/06/2021   Procedure: ENTEROSCOPY;  Surgeon: [name], MD;  Location: [facility] ENDOSCOPY;  Service: Gastroenterology;  Laterality: N/A;   ENTEROSCOPY N/A 08/24/2021   Procedure: ENTEROSCOPY;  Surgeon: Ladene Artist, MD;  Location: [facility] ENDOSCOPY;  Service: Gastroenterology;  Laterality: N/A;   ENTEROSCOPY N/A 09/15/2021   Procedure: ENTEROSCOPY;  Surgeon: Sharyn Creamer, MD;  Location: [facility] ENDOSCOPY;  Service: Gastroenterology;  Laterality: N/A;   ENTEROSCOPY N/A 10/06/2021   Procedure: ENTEROSCOPY;  Surgeon: [name], MD;  Location: [facility] ENDOSCOPY;  Service: Gastroenterology;  Laterality: N/A;   ENTEROSCOPY N/A 11/14/2021   Procedure: ENTEROSCOPY;  Surgeon: Doran Stabler, MD;  Location: [facility] ENDOSCOPY;  Service: Gastroenterology;  Laterality: N/A;   ESOPHAGOGASTRODUODENOSCOPY N/A 08/10/2020    Procedure: ESOPHAGOGASTRODUODENOSCOPY (EGD);  Surgeon: Jerene Bears, MD;  Location: [facility] ENDOSCOPY;  Service: Gastroenterology;  Laterality: N/A;   ESOPHAGOGASTRODUODENOSCOPY (EGD) WITH PROPOFOL N/A 06/30/2019   Procedure: ESOPHAGOGASTRODUODENOSCOPY (EGD) WITH PROPOFOL;  Surgeon: Ronald Lobo, MD;  Location: [place];  Service: Endoscopy;  Laterality: N/A;   ESOPHAGOGASTRODUODENOSCOPY (EGD) WITH PROPOFOL N/A 01/12/2021   Procedure: ESOPHAGOGASTRODUODENOSCOPY (EGD) WITH PROPOFOL;  Surgeon: Sharyn Creamer, MD;  Location: [place];  Service: Gastroenterology;  Laterality: N/A;   ESOPHAGOGASTRODUODENOSCOPY (EGD) WITH PROPOFOL N/A 02/08/2021   Procedure: ESOPHAGOGASTRODUODENOSCOPY (EGD) WITH PROPOFOL;  Surgeon: Sharyn Creamer, MD;  Location: [place];  Service: Gastroenterology;  Laterality: N/A;   FISTULA SUPERFICIALIZATION Left 10/17/2019   Procedure: LEFT UPPER EXTREMITY FISTULA REVISION, SIDE BRANCH LIGATION,  AND SUPERFICIALIZATION;  Surgeon: Marty Heck, MD;  Location: [place];  Service: Vascular;  Laterality: Left;   FISTULA SUPERFICIALIZATION Left 16/01/9603   Procedure: PLICATION OF ANEURYSM LEFT ARTERIOVENOUS FISTULA;  Surgeon: Angelia Mould, MD;  Location: [place];  Service: Vascular;  Laterality: Left;   FLEXIBLE SIGMOIDOSCOPY N/A 07/05/2021   Procedure: FLEXIBLE SIGMOIDOSCOPY;  Surgeon: Carol Ada, MD;  Location: [place];  Service: Gastroenterology;  Laterality: N/A;   GIVENS CAPSULE STUDY N/A 06/30/2019   Procedure: GIVENS CAPSULE STUDY;  Surgeon: Ronald Lobo, MD;  Location: [place];  Service: Endoscopy;  Laterality: N/A;   GIVENS CAPSULE STUDY  N/A 08/25/2021   Procedure: GIVENS CAPSULE STUDY;  Surgeon: Ladene Artist, MD;  Location: [place];  Service: Gastroenterology;  Laterality: N/A;   GIVENS CAPSULE STUDY N/A 10/06/2021   Procedure: GIVENS CAPSULE STUDY;  Surgeon: [name], MD;  Location: [place];  Service: Gastroenterology;   Laterality: N/A;   HEMOSTASIS CLIP PLACEMENT  08/10/2020   Procedure: HEMOSTASIS CLIP PLACEMENT;  Surgeon: Jerene Bears, MD;  Location: [place];  Service: Gastroenterology;;   HEMOSTASIS CLIP PLACEMENT  01/12/2021   Procedure: HEMOSTASIS CLIP PLACEMENT;  Surgeon: Sharyn Creamer, MD;  Location: [place];  Service: Gastroenterology;;   HEMOSTASIS CONTROL  08/02/2019   Procedure: HEMOSTASIS CONTROL;  Surgeon: Yetta Flock, MD;  Location: [place];  Service: Gastroenterology;;   HOT HEMOSTASIS  02/24/2021   Procedure: HOT HEMOSTASIS (ARGON PLASMA COAGULATION/BICAP);  Surgeon: [name], MD;  Location: [facility] ENDOSCOPY;  Service: Gastroenterology;;   HOT HEMOSTASIS N/A 03/26/2021   Procedure: HOT HEMOSTASIS (ARGON PLASMA COAGULATION/BICAP);  Surgeon: Lavena Bullion, DO;  Location: [facility] ENDOSCOPY;  Service: Gastroenterology;  Laterality: N/A;   HOT HEMOSTASIS N/A 07/05/2021   Procedure: HOT HEMOSTASIS (ARGON PLASMA COAGULATION/BICAP);  Surgeon: Carol Ada, MD;  Location: [place];  Service: Gastroenterology;  Laterality: N/A;   HOT HEMOSTASIS N/A 09/15/2021   Procedure: HOT HEMOSTASIS (ARGON PLASMA COAGULATION/BICAP);  Surgeon: Sharyn Creamer, MD;  Location: [place];  Service: Gastroenterology;  Laterality: N/A;   HOT HEMOSTASIS N/A 10/06/2021   Procedure: HOT HEMOSTASIS (ARGON PLASMA COAGULATION/BICAP);  Surgeon: [name], MD;  Location: [place];  Service: Gastroenterology;  Laterality: N/A;   INCISION AND DRAINAGE ABSCESS N/A 06/29/2016   Procedure: INCISION AND DRAINAGE ABDOMINAL WALL ABSCESS;  Surgeon: Alphonsa Overall, MD;  Location: [facility];  Service: General;  Laterality: N/A;   INSERTION OF DIALYSIS CATHETER Right 08/03/2019   Procedure: INSERTION OF DIALYSIS CATHETER;  Surgeon: Waynetta Sandy, MD;  Location: [place];  Service: Vascular;  Laterality: Right;   INSERTION OF DIALYSIS CATHETER Right 10/22/2019   Procedure: INSERTION OF 23CM  TUNNELED DIALYSIS CATHETER RIGHT INTERNAL JUGULAR;  Surgeon: Angelia Mould, MD;  Location: [place];  Service: Vascular;  Laterality: Right;   INSERTION OF DIALYSIS CATHETER Right 08/12/2020   Procedure: INSERTION OF Right internal Jugular TUNNELED  DIALYSIS CATHETER.;  Surgeon: Waynetta Sandy, MD;  Location: [place];  Service: Vascular;  Laterality: Right;   INSERTION OF DIALYSIS CATHETER N/A 04/19/2021   Procedure: INSERTION OF TUNNELED DIALYSIS CATHETER;  Surgeon: Angelia Mould, MD;  Location: [place];  Service: Vascular;  Laterality: N/A;   IR 3D INDEPENDENT WKST  11/07/2021   IR ANGIOGRAM SELECTIVE EACH ADDITIONAL VESSEL  11/07/2021   IR ANGIOGRAM SELECTIVE EACH ADDITIONAL VESSEL  11/07/2021   IR ANGIOGRAM SELECTIVE EACH ADDITIONAL VESSEL  11/07/2021   IR ANGIOGRAM SELECTIVE EACH ADDITIONAL VESSEL  11/28/2021   IR ANGIOGRAM VISCERAL SELECTIVE  11/07/2021   IR ANGIOGRAM VISCERAL SELECTIVE  11/28/2021   IR EMBO ARTERIAL NOT HEMORR HEMANG INC GUIDE ROADMAPPING  11/07/2021   IR EMBO TUMOR ORGAN ISCHEMIA INFARCT INC GUIDE ROADMAPPING  11/28/2021   IR PARACENTESIS  02/20/2022   IR RADIOLOGIST EVAL & MGMT  09/12/2021   IR US GUIDE VASC ACCESS LEFT  11/07/2021   IR US GUIDE VASC ACCESS LEFT  11/28/2021   Left heel surgery     LOWER EXTREMITY ANGIOGRAPHY N/A 07/08/2021   Procedure: Lower Extremity Angiography;  Surgeon: Cherre Robins, MD;  Location: [place] CV  LAB;  Service: Cardiovascular;  Laterality: N/A;   PENILE BIOPSY N/A 03/26/2020   Procedure: PENILE ULCER DEBRIDEMENT;  Surgeon: Remi Haggard, MD;  Location[facility];  Service: Urology;  Laterality: N/A;  30 MINS   PERIPHERAL VASCULAR INTERVENTION Right 07/08/2021   Procedure: PERIPHERAL VASCULAR INTERVENTION;  Surgeon: Cherre Robins, MD;  Location: [place] CV LAB;  Service: Cardiovascular;  Laterality: Right;   SCLEROTHERAPY  01/12/2021   Procedure: SCLEROTHERAPY;  Surgeon: Sharyn Creamer, MD;  Location: [facility] ENDOSCOPY;   Service: Gastroenterology;;   Clide Deutscher  02/24/2021   Procedure: Clide Deutscher;  Surgeon: [name], MD;  Location: [place];  Service: Gastroenterology;;   SUBMUCOSAL TATTOO INJECTION  08/24/2021   Procedure: SUBMUCOSAL TATTOO INJECTION;  Surgeon: Ladene Artist, MD;  Location: [place];  Service: Gastroenterology;;   SUBMUCOSAL TATTOO INJECTION  09/15/2021   Procedure: SUBMUCOSAL TATTOO INJECTION;  Surgeon: Sharyn Creamer, MD;  Location: [place];  Service: Gastroenterology;;   VIDEO BRONCHOSCOPY N/A 12/26/2021   Procedure: VIDEO BRONCHOSCOPY WITH FLUORO;  Surgeon: Juanito Doom, MD;  Location: [place];  Service: Cardiopulmonary;  Laterality: N/A;   Family History  Problem Relation Age of Onset   Colon cancer Neg Hx    Social History:  reports that he has been smoking cigarettes. He has a 22.50 pack-year smoking history. He has never used smokeless tobacco. He reports that he does not currently use alcohol. He reports that he does not currently use drugs after having used the following drugs: "Crack" cocaine.  ROS: As per HPI otherwise negative. Physical Exam: Vitals:   03/23/22 1014 03/23/22 1100 03/23/22 1330 03/23/22 1345  BP:  (!) 174/61 (!) 169/95 (!) 143/123  Pulse:  93 91 92  Resp:  '20 16 20  '$ Temp: 98 F (36.7 C)     TempSrc: Oral     SpO2:  96% 99% 98%  Weight:      Height:         General: Well developed, alert male in NAD Head: Normocephalic, atraumatic, sclera non-icteric, mucus membranes are moist. Lungs: On O2 via [place]. + expiratory wheeze bilateral lower lobes. No rales auscultated. Heart: RRR with normal S1, S2. No murmurs, rubs, or gallops appreciated. Abdomen: Soft, non-tender, non-distended with normoactive bowel sounds. No rebound/guarding. No obvious abdominal masses. Musculoskeletal:  Strength and tone appear normal for age. Lower extremities: 2+ pitting edema bilateral lower extremities Neuro: Alert and oriented X 3.  Moves all extremities spontaneously. Psych:  Responds to questions appropriately with a normal affect. Dialysis Access: LUE AVF + bruit  Allergies  Allergen Reactions   Seroquel [Quetiapine] Other (See Comments)    Tardive kinesia Dystonia    Dilaudid [Hydromorphone Hcl] Itching and Other (See Comments)    Pt reports itchiness after IM injection    Prior to Admission medications   Medication Sig Start Date End Date Taking? Authorizing Provider  amLODipine (NORVASC) 5 MG tablet Take 1 tablet (5 mg total) by mouth daily. 02/10/22 05/11/22  [name], NP  atorvastatin (LIPITOR) 40 MG tablet Take 1 tablet (40 mg total) by mouth daily. 09/18/21 12/26/22  [place] Donnamarie Poag, DO  folic acid (FOLVITE) 1 MG tablet Take 1 tablet (1 mg total) by mouth daily. Patient not taking: Reported on 02/25/2022 12/12/21   Eugenie Filler, MD  gabapentin (NEURONTIN) 300 MG capsule Take 300 mg by mouth 3 (three) times daily. Patient not taking: Reported on 02/25/2022 02/07/22   [provider]  loperamide (IMODIUM)  2 MG capsule Take 1 capsule (2 mg total) by mouth 4 (four) times daily as needed for diarrhea or loose stools. 01/31/22   Carmin Muskrat, MD  Multiple Vitamin (MULTIVITAMIN WITH MINERALS) TABS tablet Take 1 tablet by mouth daily. Patient not taking: Reported on 02/25/2022 12/12/21   Eugenie Filler, MD  pantoprazole (PROTONIX) 40 MG tablet Take 1 tablet (40 mg total) by mouth 2 (two) times daily before a meal. Patient not taking: Reported on 02/25/2022 10/10/21 04/05/22  Mercy Riding, MD  thiamine (VITAMIN B1) 100 MG tablet Take 1 tablet (100 mg total) by mouth daily. Patient not taking: Reported on 02/25/2022 12/12/21   Eugenie Filler, MD  traZODone (DESYREL) 50 MG tablet Take 1 tablet (50 mg total) by mouth daily as needed for sleep. Patient not taking: Reported on 02/25/2022 02/10/22   [name], NP  colchicine 0.6 MG tablet Take 0.5 tablets (0.3 mg total) by mouth 2  (two) times daily. 07/24/20 07/29/20  Noemi Chapel, MD  furosemide (LASIX) 40 MG tablet Take 1 tablet (40 mg total) by mouth daily. 07/25/19 02/09/20  Ladona Horns, MD   Current Facility-Administered Medications  Medication Dose Route Frequency Provider Last Rate Last Admin   furosemide (LASIX) 120 mg in dextrose 5 % 50 mL IVPB  120 mg Intravenous Once Elgie Congo, MD       ipratropium-albuterol (DUONEB) 0.5-2.5 (3) MG/3ML nebulizer solution 3 mL  3 mL Nebulization Once Elgie Congo, MD       Current Outpatient Medications  Medication Sig Dispense Refill   amLODipine (NORVASC) 5 MG tablet Take 1 tablet (5 mg total) by mouth daily. 30 tablet 2   atorvastatin (LIPITOR) 40 MG tablet Take 1 tablet (40 mg total) by mouth daily. 30 tablet 2   folic acid (FOLVITE) 1 MG tablet Take 1 tablet (1 mg total) by mouth daily. (Patient not taking: Reported on 02/25/2022)     gabapentin (NEURONTIN) 300 MG capsule Take 300 mg by mouth 3 (three) times daily. (Patient not taking: Reported on 02/25/2022)     loperamide (IMODIUM) 2 MG capsule Take 1 capsule (2 mg total) by mouth 4 (four) times daily as needed for diarrhea or loose stools. 24 capsule 0   Multiple Vitamin (MULTIVITAMIN WITH MINERALS) TABS tablet Take 1 tablet by mouth daily. (Patient not taking: Reported on 02/25/2022)     pantoprazole (PROTONIX) 40 MG tablet Take 1 tablet (40 mg total) by mouth 2 (two) times daily before a meal. (Patient not taking: Reported on 02/25/2022) 60 tablet 2   thiamine (VITAMIN B1) 100 MG tablet Take 1 tablet (100 mg total) by mouth daily. (Patient not taking: Reported on 02/25/2022)     traZODone (DESYREL) 50 MG tablet Take 1 tablet (50 mg total) by mouth daily as needed for sleep. (Patient not taking: Reported on 02/25/2022) 90 tablet 0   Labs: Basic Metabolic Panel: Recent Labs  Lab 03/23/22 0612  NA 132*  K 3.9  CL 94*  CO2 26  GLUCOSE 123*  BUN 29*  CREATININE 6.84*  CALCIUM 8.9   Liver Function  Tests: Recent Labs  Lab 03/23/22 0612  AST 57*  ALT 17  ALKPHOS 122  BILITOT 0.9  PROT 8.7*  ALBUMIN 1.9*   No results for input(s): "LIPASE", "AMYLASE" in the last 168 hours. No results for input(s): "AMMONIA" in the last 168 hours. CBC: Recent Labs  Lab 03/23/22 0612  WBC 5.2  NEUTROABS 3.1  HGB 7.7*  HCT 24.1*  MCV 105.7*  PLT 123*   Cardiac Enzymes: No results for input(s): "CKTOTAL", "CKMB", "CKMBINDEX", "TROPONINI" in the last 168 hours. CBG: No results for input(s): "GLUCAP" in the last 168 hours. Iron Studies: No results for input(s): "IRON", "TIBC", "TRANSFERRIN", "FERRITIN" in the last 72 hours. Studies/Results: DG Chest 2 View  Result Date: 03/23/2022 CLINICAL DATA:  Leg swelling and shortness of breath. EXAM: CHEST - 2 VIEW COMPARISON:  03/11/2022 FINDINGS: Stable cardiomediastinal contours. Lung volumes are low. Bilateral pleural effusions are noted right greater than left. Mild diffuse increase interstitial markings concerning for edema. Similar appearance of left mid and bilateral lower lobe opacities. IMPRESSION: 1. No change in aeration to the lungs compared with previous exam. 2. Persistent bilateral pleural effusions and interstitial edema. Electronically Signed   By: Kerby Moors M.D.   On: 03/23/2022 07:20    Dialysis Orders:  Center: [facility]  on MWF 180NRe 4 hours BFR 400 DFR Auto 1.5 EDW 98.8kg 2K 2Ca AVF 15g no heparin Mircera 225 mcg IV q 2 weeks- last dose 03/14/22 Hectorol 4 mcg IV q HD  Assessment/Plan:  Shortness of breath/leg edema: Due to high fluid gains and missed/shortened dialysis. May also have a viral URI with reported nasal congestion. We will dialyze him first shift tomorrow morning  ESRD:  MWF schedule, missed/shortened HD this week. Reinforced importance of HD compliance. Will have dialysis here tomorrow AM  Hypertension/volume: See above, UFG 4.5L tomorrow  Anemia: Hgb chronically low with recurrent GI bleeds. On max dose ESA, not  due for next dose yet.   Metabolic bone disease: Corrected calcium high (low albumin), will hold VDRA for now. Does not take binders outpatient   Nutrition:  Will need renal diet with fluid restrictions. Pt informed he also needs to limit ice T2DM: management per primary team  Anice Paganini, PA-C 03/23/2022, 2:02 PM  [facility] Pager: [phone]

## 2022-03-23 NOTE — H&P (Signed)
History and Physical    Patient: Daniel [name] KPV:[id number] DOB: [birth year] DOA: 03/23/2022 DOS: the patient was seen and examined on 03/23/2022 PCP: [name], NP  Patient coming from: Home - lives in a boarding house; NOK: Lucille Passy, [phone]    Chief Complaint: LE edema, SOB  HPI: LAUTARO KORAL is a 61 y.o. 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   He reports that he noticed LE edema maybe 4-5 days ago; it was bad enough that he has been unable to wear his prosthetic leg.  He has not missed HD and does not report dietary indiscretion over the holiday.  He became acutely SOB overnight with a terrible cough.  He was given 120 mg IV Lasix but continues to have a refractory cough and SOB.    ER Course:  MWF HD, last on Friday.  Noncompliance.  Edema, wheezing, pulm edema on CXR with effusions.  Nephrology encourages Lasix and will do HD tomorrow, will consult.     Review of Systems: As mentioned in the history of present illness. All other systems reviewed and are negative. Past Medical History:  Diagnosis Date   Anemia    Diabetes mellitus without complication [facility])    patient denies   Dialysis patient [facility])    End stage chronic kidney disease ([place])    Hypertension    ICH (intracerebral hemorrhage) ([place]) 05/20/2017   PAD (peripheral artery disease) (HCC)    Shoulder pain, left 06/28/2013   Past Surgical History:  Procedure Laterality Date   A/V FISTULAGRAM N/A 08/15/2020   Procedure: A/V FISTULAGRAM - Left Upper;  Surgeon: Cherre Robins, MD;  Location: [place] [facility];  Service: Cardiovascular;  Laterality: N/A;   ABDOMINAL AORTOGRAM W/LOWER EXTREMITY N/A 04/08/2021   Procedure: ABDOMINAL AORTOGRAM W/LOWER EXTREMITY;  Surgeon:  Waynetta Sandy, MD;  Location: [place] [facility];  Service: Cardiovascular;  Laterality: N/A;   AMPUTATION Left 04/16/2021   Procedure: LEFT BELOW KNEE AMPUTATION;  Surgeon: Angelia Mould, MD;  Location: [place];  Service: Vascular;  Laterality: Left;   AMPUTATION Left 06/17/2021   Procedure: REVISION AMPUTATION BELOW KNEE;  Surgeon: Broadus John, MD;  Location: [place];  Service: Vascular;  Laterality: Left;   APPENDECTOMY     APPLICATION OF WOUND VAC Left 06/17/2021   Procedure: APPLICATION OF WOUND VAC;  Surgeon: Broadus John, MD;  Location: [place];  Service: Vascular;  Laterality: Left;   AV FISTULA PLACEMENT Left 08/03/2019   Procedure: LEFT ARM ARTERIOVENOUS (AV) CEPHALIC  FISTULA CREATION;  Surgeon: Waynetta Sandy, MD;  Location: [place];  Service: Vascular;  Laterality: Left;   BIOPSY  06/30/2019   Procedure: BIOPSY;  Surgeon: Ronald Lobo, MD;  Location: [facility] ENDOSCOPY;  Service: Endoscopy;;   BIOPSY  08/02/2019   Procedure: BIOPSY;  Surgeon: Yetta Flock, MD;  Location: [facility] ENDOSCOPY;  Service: Gastroenterology;;   BIOPSY  02/08/2021   Procedure: BIOPSY;  Surgeon: Sharyn Creamer, MD;  Location: [facility] ENDOSCOPY;  Service: Gastroenterology;;   BIOPSY  02/24/2021   Procedure: BIOPSY;  Surgeon: [name], MD;  Location: [facility] ENDOSCOPY;  Service: Gastroenterology;;   BIOPSY  03/26/2021   Procedure: BIOPSY;  Surgeon: Lavena Bullion, DO;  Location: [place];  Service: Gastroenterology;;  BIOPSY  08/06/2021   Procedure: BIOPSY;  Surgeon: [name], MD;  Location: [place];  Service: Gastroenterology;;   BIOPSY  09/15/2021   Procedure: BIOPSY;  Surgeon: Sharyn Creamer, MD;  Location: [place];  Service: Gastroenterology;;   BRONCHIAL BIOPSY  12/26/2021   Procedure: BRONCHIAL BIOPSIES;  Surgeon: Juanito Doom, MD;  Location: [place];  Service: Cardiopulmonary;;   BRONCHIAL WASHINGS  12/26/2021   Procedure: BRONCHIAL  WASHINGS;  Surgeon: Juanito Doom, MD;  Location: [place];  Service: Cardiopulmonary;;   COLONOSCOPY  01/23/2012   Procedure: COLONOSCOPY;  Surgeon: Danie Binder, MD;  [facility];  Service: Endoscopy;  Laterality: N/A;  11:10 AM   COLONOSCOPY WITH PROPOFOL N/A 06/30/2019   Procedure: COLONOSCOPY WITH PROPOFOL;  Surgeon: Ronald Lobo, MD;  Location: [place];  Service: Endoscopy;  Laterality: N/A;   ENTEROSCOPY N/A 08/02/2019   Procedure: ENTEROSCOPY;  Surgeon: Yetta Flock, MD;  Location: [facility] ENDOSCOPY;  Service: Gastroenterology;  Laterality: N/A;   ENTEROSCOPY N/A 02/24/2021   Procedure: ENTEROSCOPY;  Surgeon: [name], MD;  Location: [facility] ENDOSCOPY;  Service: Gastroenterology;  Laterality: N/A;   ENTEROSCOPY N/A 03/26/2021   Procedure: ENTEROSCOPY;  Surgeon: Lavena Bullion, DO;  Location: [place];  Service: Gastroenterology;  Laterality: N/A;   ENTEROSCOPY N/A 07/05/2021   Procedure: ENTEROSCOPY;  Surgeon: Carol Ada, MD;  Location: [place];  Service: Gastroenterology;  Laterality: N/A;   ENTEROSCOPY N/A 08/06/2021   Procedure: ENTEROSCOPY;  Surgeon: [name], MD;  Location: [facility] ENDOSCOPY;  Service: Gastroenterology;  Laterality: N/A;   ENTEROSCOPY N/A 08/24/2021   Procedure: ENTEROSCOPY;  Surgeon: Ladene Artist, MD;  Location: [facility] ENDOSCOPY;  Service: Gastroenterology;  Laterality: N/A;   ENTEROSCOPY N/A 09/15/2021   Procedure: ENTEROSCOPY;  Surgeon: Sharyn Creamer, MD;  Location: [name] ENDOSCOPY;  Service: Gastroenterology;  Laterality: N/A;   ENTEROSCOPY N/A 10/06/2021   Procedure: ENTEROSCOPY;  Surgeon: [name], MD;  Location: [facility] ENDOSCOPY;  Service: Gastroenterology;  Laterality: N/A;   ENTEROSCOPY N/A 11/14/2021   Procedure: ENTEROSCOPY;  Surgeon: Doran Stabler, MD;  Location: [facility] ENDOSCOPY;  Service: Gastroenterology;  Laterality: N/A;   ESOPHAGOGASTRODUODENOSCOPY N/A 08/10/2020   Procedure:  ESOPHAGOGASTRODUODENOSCOPY (EGD);  Surgeon: Jerene Bears, MD;  Location: [facility] ENDOSCOPY;  Service: Gastroenterology;  Laterality: N/A;   ESOPHAGOGASTRODUODENOSCOPY (EGD) WITH PROPOFOL N/A 06/30/2019   Procedure: ESOPHAGOGASTRODUODENOSCOPY (EGD) WITH PROPOFOL;  Surgeon: Ronald Lobo, MD;  Location: [place];  Service: Endoscopy;  Laterality: N/A;   ESOPHAGOGASTRODUODENOSCOPY (EGD) WITH PROPOFOL N/A 01/12/2021   Procedure: ESOPHAGOGASTRODUODENOSCOPY (EGD) WITH PROPOFOL;  Surgeon: Sharyn Creamer, MD;  Location: [place];  Service: Gastroenterology;  Laterality: N/A;   ESOPHAGOGASTRODUODENOSCOPY (EGD) WITH PROPOFOL N/A 02/08/2021   Procedure: ESOPHAGOGASTRODUODENOSCOPY (EGD) WITH PROPOFOL;  Surgeon: Sharyn Creamer, MD;  Location: [place];  Service: Gastroenterology;  Laterality: N/A;   FISTULA SUPERFICIALIZATION Left 10/17/2019   Procedure: LEFT UPPER EXTREMITY FISTULA REVISION, SIDE BRANCH LIGATION,  AND SUPERFICIALIZATION;  Surgeon: Marty Heck, MD;  Location: [place];  Service: Vascular;  Laterality: Left;   FISTULA SUPERFICIALIZATION Left 12/09/4816   Procedure: PLICATION OF ANEURYSM LEFT ARTERIOVENOUS FISTULA;  Surgeon: Angelia Mould, MD;  Location: [place];  Service: Vascular;  Laterality: Left;   FLEXIBLE SIGMOIDOSCOPY N/A 07/05/2021   Procedure: FLEXIBLE SIGMOIDOSCOPY;  Surgeon: Carol Ada, MD;  Location: [place];  Service: Gastroenterology;  Laterality: N/A;   GIVENS CAPSULE STUDY N/A 06/30/2019   Procedure: GIVENS CAPSULE STUDY;  Surgeon:  Ronald Lobo, MD;  Location: [facility] ENDOSCOPY;  Service: Endoscopy;  Laterality: N/A;   GIVENS CAPSULE STUDY N/A 08/25/2021   Procedure: GIVENS CAPSULE STUDY;  Surgeon: Ladene Artist, MD;  Location: [place];  Service: Gastroenterology;  Laterality: N/A;   GIVENS CAPSULE STUDY N/A 10/06/2021   Procedure: GIVENS CAPSULE STUDY;  Surgeon: [name], MD;  Location: [place];  Service: Gastroenterology;  Laterality:  N/A;   HEMOSTASIS CLIP PLACEMENT  08/10/2020   Procedure: HEMOSTASIS CLIP PLACEMENT;  Surgeon: Jerene Bears, MD;  Location: [place];  Service: Gastroenterology;;   HEMOSTASIS CLIP PLACEMENT  01/12/2021   Procedure: HEMOSTASIS CLIP PLACEMENT;  Surgeon: Sharyn Creamer, MD;  Location: [place];  Service: Gastroenterology;;   HEMOSTASIS CONTROL  08/02/2019   Procedure: HEMOSTASIS CONTROL;  Surgeon: Yetta Flock, MD;  Location: [place];  Service: Gastroenterology;;   HOT HEMOSTASIS  02/24/2021   Procedure: HOT HEMOSTASIS (ARGON PLASMA COAGULATION/BICAP);  Surgeon: [name], MD;  Location: [facility] ENDOSCOPY;  Service: Gastroenterology;;   HOT HEMOSTASIS N/A 03/26/2021   Procedure: HOT HEMOSTASIS (ARGON PLASMA COAGULATION/BICAP);  Surgeon: Lavena Bullion, DO;  Location: [facility] ENDOSCOPY;  Service: Gastroenterology;  Laterality: N/A;   HOT HEMOSTASIS N/A 07/05/2021   Procedure: HOT HEMOSTASIS (ARGON PLASMA COAGULATION/BICAP);  Surgeon: Carol Ada, MD;  Location: [place];  Service: Gastroenterology;  Laterality: N/A;   HOT HEMOSTASIS N/A 09/15/2021   Procedure: HOT HEMOSTASIS (ARGON PLASMA COAGULATION/BICAP);  Surgeon: Sharyn Creamer, MD;  Location: [place];  Service: Gastroenterology;  Laterality: N/A;   HOT HEMOSTASIS N/A 10/06/2021   Procedure: HOT HEMOSTASIS (ARGON PLASMA COAGULATION/BICAP);  Surgeon: [name], MD;  Location: [place];  Service: Gastroenterology;  Laterality: N/A;   INCISION AND DRAINAGE ABSCESS N/A 06/29/2016   Procedure: INCISION AND DRAINAGE ABDOMINAL WALL ABSCESS;  Surgeon: Alphonsa Overall, MD;  Location: [facility];  Service: General;  Laterality: N/A;   INSERTION OF DIALYSIS CATHETER Right 08/03/2019   Procedure: INSERTION OF DIALYSIS CATHETER;  Surgeon: Waynetta Sandy, MD;  Location: [place];  Service: Vascular;  Laterality: Right;   INSERTION OF DIALYSIS CATHETER Right 10/22/2019   Procedure: INSERTION OF 23CM TUNNELED DIALYSIS  CATHETER RIGHT INTERNAL JUGULAR;  Surgeon: Angelia Mould, MD;  Location: [place];  Service: Vascular;  Laterality: Right;   INSERTION OF DIALYSIS CATHETER Right 08/12/2020   Procedure: INSERTION OF Right internal Jugular TUNNELED  DIALYSIS CATHETER.;  Surgeon: Waynetta Sandy, MD;  Location: [place];  Service: Vascular;  Laterality: Right;   INSERTION OF DIALYSIS CATHETER N/A 04/19/2021   Procedure: INSERTION OF TUNNELED DIALYSIS CATHETER;  Surgeon: Angelia Mould, MD;  Location: [place];  Service: Vascular;  Laterality: N/A;   IR 3D INDEPENDENT WKST  11/07/2021   IR ANGIOGRAM SELECTIVE EACH ADDITIONAL VESSEL  11/07/2021   IR ANGIOGRAM SELECTIVE EACH ADDITIONAL VESSEL  11/07/2021   IR ANGIOGRAM SELECTIVE EACH ADDITIONAL VESSEL  11/07/2021   IR ANGIOGRAM SELECTIVE EACH ADDITIONAL VESSEL  11/28/2021   IR ANGIOGRAM VISCERAL SELECTIVE  11/07/2021   IR ANGIOGRAM VISCERAL SELECTIVE  11/28/2021   IR EMBO ARTERIAL NOT HEMORR HEMANG INC GUIDE ROADMAPPING  11/07/2021   IR EMBO TUMOR ORGAN ISCHEMIA INFARCT INC GUIDE ROADMAPPING  11/28/2021   IR PARACENTESIS  02/20/2022   IR RADIOLOGIST EVAL & MGMT  09/12/2021   IR US GUIDE VASC ACCESS LEFT  11/07/2021   IR US GUIDE VASC ACCESS LEFT  11/28/2021   Left heel surgery     LOWER EXTREMITY ANGIOGRAPHY N/A  07/08/2021   Procedure: Lower Extremity Angiography;  Surgeon: Cherre Robins, MD;  Location: [place] [facility];  Service: Cardiovascular;  Laterality: N/A;   PENILE BIOPSY N/A 03/26/2020   Procedure: PENILE ULCER DEBRIDEMENT;  Surgeon: Remi Haggard, MD;  Location[facility];  Service: Urology;  Laterality: N/A;  30 MINS   PERIPHERAL VASCULAR INTERVENTION Right 07/08/2021   Procedure: PERIPHERAL VASCULAR INTERVENTION;  Surgeon: Cherre Robins, MD;  Location: [place] [facility];  Service: Cardiovascular;  Laterality: Right;   SCLEROTHERAPY  01/12/2021   Procedure: SCLEROTHERAPY;  Surgeon: Sharyn Creamer, MD;  Location: [facility] ENDOSCOPY;  Service:  Gastroenterology;;   Clide Deutscher  02/24/2021   Procedure: Clide Deutscher;  Surgeon: [name], MD;  Location: [place];  Service: Gastroenterology;;   SUBMUCOSAL TATTOO INJECTION  08/24/2021   Procedure: SUBMUCOSAL TATTOO INJECTION;  Surgeon: Ladene Artist, MD;  Location: [place];  Service: Gastroenterology;;   SUBMUCOSAL TATTOO INJECTION  09/15/2021   Procedure: SUBMUCOSAL TATTOO INJECTION;  Surgeon: Sharyn Creamer, MD;  Location: [place];  Service: Gastroenterology;;   VIDEO BRONCHOSCOPY N/A 12/26/2021   Procedure: VIDEO BRONCHOSCOPY WITH FLUORO;  Surgeon: Juanito Doom, MD;  Location: [place];  Service: Cardiopulmonary;  Laterality: N/A;   Social History:  reports that he has been smoking cigarettes. He has a 22.50 pack-year smoking history. He has never used smokeless tobacco. He reports that he does not currently use alcohol. He reports that he does not currently use drugs after having used the following drugs: "Crack" cocaine.  Allergies  Allergen Reactions   Seroquel [Quetiapine] Other (See Comments)    Tardive kinesia Dystonia    Dilaudid [Hydromorphone Hcl] Itching and Other (See Comments)    Pt reports itchiness after IM injection     Family History  Problem Relation Age of Onset   Colon cancer Neg Hx     Prior to Admission medications   Medication Sig Start Date End Date Taking? Authorizing Provider  amLODipine (NORVASC) 5 MG tablet Take 1 tablet (5 mg total) by mouth daily. 02/10/22 05/11/22  [name], NP  atorvastatin (LIPITOR) 40 MG tablet Take 1 tablet (40 mg total) by mouth daily. 09/18/21 12/26/22  [place] Donnamarie Poag, DO  folic acid (FOLVITE) 1 MG tablet Take 1 tablet (1 mg total) by mouth daily. Patient not taking: Reported on 02/25/2022 12/12/21   Eugenie Filler, MD  gabapentin (NEURONTIN) 300 MG capsule Take 300 mg by mouth 3 (three) times daily. Patient not taking: Reported on 02/25/2022 02/07/22   [provider]  loperamide (IMODIUM) 2 MG capsule Take 1 capsule (2 mg total) by mouth 4 (four) times daily as needed for diarrhea or loose stools. 01/31/22   Carmin Muskrat, MD  Multiple Vitamin (MULTIVITAMIN WITH MINERALS) TABS tablet Take 1 tablet by mouth daily. Patient not taking: Reported on 02/25/2022 12/12/21   Eugenie Filler, MD  pantoprazole (PROTONIX) 40 MG tablet Take 1 tablet (40 mg total) by mouth 2 (two) times daily before a meal. Patient not taking: Reported on 02/25/2022 10/10/21 04/05/22  Mercy Riding, MD  thiamine (VITAMIN B1) 100 MG tablet Take 1 tablet (100 mg total) by mouth daily. Patient not taking: Reported on 02/25/2022 12/12/21   Eugenie Filler, MD  traZODone (DESYREL) 50 MG tablet Take 1 tablet (50 mg total) by mouth daily as needed for sleep. Patient not taking: Reported on 02/25/2022 02/10/22   [name], NP  colchicine 0.6 MG tablet Take 0.5  tablets (0.3 mg total) by mouth 2 (two) times daily. 07/24/20 07/29/20  Noemi Chapel, MD  furosemide (LASIX) 40 MG tablet Take 1 tablet (40 mg total) by mouth daily. 07/25/19 02/09/20  Ladona Horns, MD    Physical Exam: Vitals:   03/23/22 1434 03/23/22 1600 03/23/22 1615 03/23/22 1630  BP:  (!) 155/83 (!) 170/57 (!) 151/77  Pulse:  90 93 97  Resp:  '17 19 20  '$ Temp: 97.8 F (36.6 C)     TempSrc: Oral     SpO2:  100% 99% 100%  Weight:      Height:       General:  Appears calm and comfortable and is in NAD, repetitive cough that is bothersome to him Eyes:  PERRL, EOMI, normal lids, iris ENT:  grossly normal hearing, lips & tongue, mmm; poor dentition Neck:  no LAD, masses or thyromegaly Cardiovascular:  RRR, no m/r/g.  2-3+ LE edema, appears puffy on all over including his face Respiratory:   CTA bilaterally with no wheezes/rales/rhonchi.  Normal respiratory effort. Abdomen:  soft, NT, ND, some abdominal wall edema Skin:  no rash or induration seen on limited exam Musculoskeletal:  s/p L BKA Psychiatric:  blunted  mood and affect, speech fluent and appropriate, AOx3 Neurologic:  CN 2-12 grossly intact, moves all extremities in coordinated fashion   Radiological Exams on Admission: Independently reviewed - see discussion in A/P where applicable  DG Chest 2 View  Result Date: 03/23/2022 CLINICAL DATA:  Leg swelling and shortness of breath. EXAM: CHEST - 2 VIEW COMPARISON:  03/11/2022 FINDINGS: Stable cardiomediastinal contours. Lung volumes are low. Bilateral pleural effusions are noted right greater than left. Mild diffuse increase interstitial markings concerning for edema. Similar appearance of left mid and bilateral lower lobe opacities. IMPRESSION: 1. No change in aeration to the lungs compared with previous exam. 2. Persistent bilateral pleural effusions and interstitial edema. Electronically Signed   By: Kerby Moors M.D.   On: 03/23/2022 07:20    EKG: Independently reviewed.  NSR with rate 92; no evidence of acute ischemia   Labs on Admission: I have personally reviewed the available labs and imaging studies at the time of the admission.  Pertinent labs:    Glucose 123 BUN 29/Creatinine 6.84/GFR 8 - stable Albumin 1.9 AST 57 - stable WBC 5.2 Hgb 7.7 - stable   Assessment and Plan: Principal Problem:   Hypervolemia associated with renal insufficiency Active Problems:   ESRD (end stage renal disease) on dialysis (HCC)   Mixed hyperlipidemia   Hepatocellular carcinoma (HCC)   Diabetes mellitus type 2, controlled, with complications ([place])   Tobacco use disorder   Hx of BKA, left (HCC)    Volume overload in an ESRD on HD patient -Suspect this is less related to heart/renal failure and more related to dietary indiscretion leading to volume overload -Since he is dialysis-dependent, he was unable to clear the excess fluid -He is likely to benefit from serial HD both today and tomorrow and may be appropriate for d/c to home tomorrow after HD -Will observe on telemetry -Certainly, he  needs fluid restriction and very low salt intake on an ongoing basis  -Patient on chronic MWF HD -Nephrology prn order set utilized -Nephrology is consulting with plan for HD, possibly serial HD  H/o recurrent GI bleeding, cirrhosis -Prior duodenal bleeding treated with APC -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   -This was discussed with the patient and should be reviewed on an ongoing basis.   -Patch ordered at patient request.     Advance Care Planning:   Code Status: Full Code   Consults: Nephrology; [facility] team  DVT Prophylaxis: Heparin  Family Communication: None present; patient is capable of communicating with family at this time  Severity of Illness: The appropriate patient status for this patient is OBSERVATION. Observation status is judged to be reasonable and necessary in order to provide the required intensity of service to ensure the patient's safety. The patient's presenting symptoms, physical exam findings, and initial radiographic and laboratory data in the context of their medical condition is felt to place them at decreased risk for further clinical deterioration. Furthermore, it is anticipated that the patient will be medically stable for discharge from the hospital within 2 midnights of admission.   Author: Karmen Bongo, MD 03/23/2022 5:22 PM  For on call review [email or web address].

## 2022-03-24 ENCOUNTER — Encounter (HOSPITAL_COMMUNITY): Payer: Self-pay | Admitting: Internal Medicine

## 2022-03-24 DIAGNOSIS — L89152 Pressure ulcer of sacral region, stage 2: Secondary | ICD-10-CM | POA: Diagnosis present

## 2022-03-24 DIAGNOSIS — E871 Hypo-osmolality and hyponatremia: Secondary | ICD-10-CM | POA: Diagnosis present

## 2022-03-24 DIAGNOSIS — J9601 Acute respiratory failure with hypoxia: Secondary | ICD-10-CM | POA: Diagnosis present

## 2022-03-24 DIAGNOSIS — Z888 Allergy status to other drugs, medicaments and biological substances status: Secondary | ICD-10-CM | POA: Diagnosis not present

## 2022-03-24 DIAGNOSIS — J90 Pleural effusion, not elsewhere classified: Secondary | ICD-10-CM | POA: Diagnosis present

## 2022-03-24 DIAGNOSIS — R400 Somnolence: Secondary | ICD-10-CM | POA: Diagnosis present

## 2022-03-24 DIAGNOSIS — E8770 Fluid overload, unspecified: Secondary | ICD-10-CM | POA: Diagnosis present

## 2022-03-24 DIAGNOSIS — M549 Dorsalgia, unspecified: Secondary | ICD-10-CM | POA: Diagnosis present

## 2022-03-24 DIAGNOSIS — G8929 Other chronic pain: Secondary | ICD-10-CM | POA: Diagnosis present

## 2022-03-24 DIAGNOSIS — Z91158 Patient's noncompliance with renal dialysis for other reason: Secondary | ICD-10-CM | POA: Diagnosis not present

## 2022-03-24 DIAGNOSIS — N289 Disorder of kidney and ureter, unspecified: Secondary | ICD-10-CM | POA: Diagnosis not present

## 2022-03-24 DIAGNOSIS — Z992 Dependence on renal dialysis: Secondary | ICD-10-CM | POA: Diagnosis not present

## 2022-03-24 DIAGNOSIS — Z8673 Personal history of transient ischemic attack (TIA), and cerebral infarction without residual deficits: Secondary | ICD-10-CM | POA: Diagnosis not present

## 2022-03-24 DIAGNOSIS — E1122 Type 2 diabetes mellitus with diabetic chronic kidney disease: Secondary | ICD-10-CM | POA: Diagnosis present

## 2022-03-24 DIAGNOSIS — Z89512 Acquired absence of left leg below knee: Secondary | ICD-10-CM | POA: Diagnosis not present

## 2022-03-24 DIAGNOSIS — N186 End stage renal disease: Secondary | ICD-10-CM | POA: Diagnosis present

## 2022-03-24 DIAGNOSIS — F17210 Nicotine dependence, cigarettes, uncomplicated: Secondary | ICD-10-CM | POA: Diagnosis present

## 2022-03-24 DIAGNOSIS — N2581 Secondary hyperparathyroidism of renal origin: Secondary | ICD-10-CM | POA: Diagnosis present

## 2022-03-24 DIAGNOSIS — E782 Mixed hyperlipidemia: Secondary | ICD-10-CM | POA: Diagnosis present

## 2022-03-24 DIAGNOSIS — Y92410 Unspecified street and highway as the place of occurrence of the external cause: Secondary | ICD-10-CM | POA: Diagnosis not present

## 2022-03-24 DIAGNOSIS — Z1152 Encounter for screening for COVID-19: Secondary | ICD-10-CM | POA: Diagnosis not present

## 2022-03-24 DIAGNOSIS — R062 Wheezing: Secondary | ICD-10-CM | POA: Diagnosis present

## 2022-03-24 DIAGNOSIS — K7030 Alcoholic cirrhosis of liver without ascites: Secondary | ICD-10-CM | POA: Diagnosis present

## 2022-03-24 DIAGNOSIS — Z85118 Personal history of other malignant neoplasm of bronchus and lung: Secondary | ICD-10-CM | POA: Diagnosis not present

## 2022-03-24 DIAGNOSIS — J811 Chronic pulmonary edema: Secondary | ICD-10-CM | POA: Diagnosis present

## 2022-03-24 DIAGNOSIS — C220 Liver cell carcinoma: Secondary | ICD-10-CM | POA: Diagnosis present

## 2022-03-24 DIAGNOSIS — Z79899 Other long term (current) drug therapy: Secondary | ICD-10-CM | POA: Diagnosis not present

## 2022-03-24 DIAGNOSIS — E1151 Type 2 diabetes mellitus with diabetic peripheral angiopathy without gangrene: Secondary | ICD-10-CM | POA: Diagnosis present

## 2022-03-24 DIAGNOSIS — R600 Localized edema: Secondary | ICD-10-CM | POA: Diagnosis not present

## 2022-03-24 DIAGNOSIS — D631 Anemia in chronic kidney disease: Secondary | ICD-10-CM | POA: Diagnosis present

## 2022-03-24 LAB — CBC
HCT: 22.5 % — ABNORMAL LOW (ref 39.0–52.0)
Hemoglobin: 7.1 g/dL — ABNORMAL LOW (ref 13.0–17.0)
MCH: 33.6 pg (ref 26.0–34.0)
MCHC: 31.6 g/dL (ref 30.0–36.0)
MCV: 106.6 fL — ABNORMAL HIGH (ref 80.0–100.0)
Platelets: 109 10*3/uL — ABNORMAL LOW (ref 150–400)
RBC: 2.11 MIL/uL — ABNORMAL LOW (ref 4.22–5.81)
RDW: 19 % — ABNORMAL HIGH (ref 11.5–15.5)
WBC: 5.3 10*3/uL (ref 4.0–10.5)
nRBC: 0 % (ref 0.0–0.2)

## 2022-03-24 LAB — HEPATITIS B SURFACE ANTIBODY, QUANTITATIVE: Hep B S AB Quant (Post): 57.6 m[IU]/mL (ref >9.9)

## 2022-03-24 LAB — BASIC METABOLIC PANEL
Anion gap: 14 (ref 5–15)
BUN: 34 mg/dL — ABNORMAL HIGH (ref 8–23)
CO2: 24 mmol/L (ref 22–32)
Calcium: 8.7 mg/dL — ABNORMAL LOW (ref 8.9–10.3)
Chloride: 95 mmol/L — ABNORMAL LOW (ref 98–111)
Creatinine, Ser: 8.11 mg/dL — ABNORMAL HIGH (ref 0.61–1.24)
GFR, Estimated: 7 mL/min — ABNORMAL LOW (ref >60)
Glucose, Bld: 133 mg/dL — ABNORMAL HIGH (ref 70–99)
Potassium: 3.9 mmol/L (ref 3.5–5.1)
Sodium: 133 mmol/L — ABNORMAL LOW (ref 135–145)

## 2022-03-24 MED ORDER — BENZONATATE 100 MG PO CAPS
100.0000 mg | ORAL_CAPSULE | Freq: Two times a day (BID) | ORAL | Status: DC | PRN
  Administered 2022-03-24: 100 mg via ORAL
  Filled 2022-03-24 (×4): qty 1

## 2022-03-24 MED ORDER — GUAIFENESIN-DM 100-10 MG/5ML PO SYRP
5.0000 mL | ORAL_SOLUTION | ORAL | Status: DC | PRN
  Administered 2022-03-24 – 2022-03-28 (×12): 5 mL via ORAL
  Filled 2022-03-24 (×12): qty 5

## 2022-03-24 NOTE — Procedures (Signed)
No note

## 2022-03-24 NOTE — ED Notes (Signed)
Pt having increased work of breathing, decreased breath sounds. MD notified.

## 2022-03-24 NOTE — Progress Notes (Signed)
TR[facility] night cross cover note:   I was notified by RN that the patient is experiencing some sob that is slightly worse relative to earlier today, and that he is also experiencing some atypical chest pain.    Per my chart review, this is a 60 year old male with history of end-stage renal disease on hemodialysis, who was admitted earlier in the day for acute hypoxic respiratory failure in the setting of volume overload.  Nephrology was consulted, planning for hemodialysis on the morning of 03/24/2022.   Per my discussions with patient's RN this evening, while he was experiencing some worsening shortness of breath, he continues to maintain oxygen saturations in the mid to high 90s on 4 L nasal cannula, reportedly unchanged relative to supplemental O2 demands earlier in the day.  However, with subjective worsening of shortness of breath along with new atypical chest pain, EKG/chest x-ray were pursued.  EKG showed sinus rhythm without evidence of acute ischemic changes, including no evidence of ST elevation.  Chest x-ray demonstrated no significant change relative to earlier in the day, and continues to demonstrate bilateral pleural effusions with suggestion of interstitial edema, without evidence of pneumothorax.   He had received Lasix 120 mg IV x1 earlier in the afternoon, with RN conveying approximately 200 cc of urine out in response to this.  I subsequently placed an order for additional dose of Lasix 120 mg IV x1. Of note, no significant improvement in sob with nebulizer tx.   Patient continued to express shortness of breath with some evidence of increased work of breathing noted later in the shift.  Per evaluation by RT, there was recommendation for consideration for BiPAP due to the patient's increased work of breathing in setting of presenting volume overload leading up to plan for definitive management of his volume overload via hemodialysis that is anticipated for this morning (11/27). Subsequently,  I placed order for bipap for this purpose. BP stable. No residual chest discomfort.     Babs Bertin, DO Hospitalist

## 2022-03-24 NOTE — ED Notes (Signed)
[place] called at this time to see what nurse will be assigned to patient, awaiting to hear back.

## 2022-03-24 NOTE — ED Notes (Signed)
Pt removed bipap, states he could not stand it anymore. Pt has increased work of breathing.

## 2022-03-24 NOTE — Progress Notes (Signed)
PROGRESS NOTE    Daniel [name]  UVO:[id number] DOB: [birth year] DOA: 03/23/2022 PCP: [name], NP    Brief Narrative:   Daniel [name] is a 61 y.o. 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  Patient reports onset 4-5 days ago and edema has progressed to the point he is unable to wear his prosthetic leg.  Patient denies any missed HD sessions and does not report any dietary indiscretion.  Patient also with nonproductive cough.  In the ED, temperature 98.6 3 Fahrenheit, HR 93, RR 18, BP 165/83, SpO2 94% on 4 L nasal cannula.  Sodium 132, potassium 3.9, chloride 94, CO2 26, glucose 123, BUN 29, creatinine 6.84.  AST 57, ALT 17.  WBC 5.2, hemoglobin 7.7, platelets 123.  Cova-19 PCR negative.  Influenza A/B PCR negative.  Chest x-ray with bilateral pleural effusions, right greater than left with diffuse interstitial markings concerning for edema.  Patient was given furosemide 120 mg IV x 1.  Nephrology was consulted.  TRH consulted for admission for further evaluation and management of respiratory failure secondary to volume overload in the setting of ESRD on HD.  Assessment & Plan:   Acute hypoxic respiratory failure, POA Hypervolemia in the setting of ESRD on HD Patient presenting to the ED with progressive shortness of breath and lower extremity edema.  Reports compliance with HD and no dietary indiscretions. -- Nephrology following, appreciate assistance -- Fluid restriction 1200 mL/day -- Strict I's and O's and daily weights -- Continue supplemental oxygen, maintain SpO2 greater than 92%, BiPAP as needed  Hyponatremia Sodium 132 on admission, likely  secondary to hypervolemic hyponatremia in the setting of volume overload. --Continue management with HD -- BMP in the a.m.  History of recurrent GI bleed secondary to duodenal ulcer/AVM/varices Hx hepatocellular carcinoma EtOH cirrhosis Follows with medical oncology, [name] in regards to his hepatocellular carcinoma.  Underwent Y90 procedure on 11/28/2021 by interventional radiology with no plans for systemic therapy at this point. -- Hemoglobin 7.7>7.1 -- Protonix 40 mg p.o. twice daily -- Repeat CBC in the a.m.  Anemia secondary to chronic kidney disease -- Hemoglobin 7.7> 7.1 -- Check anemia panel -- Transfuse for hemoglobin less than 7.0. -- Repeat CBC in a.m.  DM2 Hemoglobin C 5.2, well-controlled.  Diet controlled at home.  Hyperlipidemia: Continue atorvastatin 40 mg p.o. daily  Essential hypertension:  --Continue amlodipine 5 mg p.o. daily -- Hydralazine 5 mg IV every 4 hours as needed SBP greater than 180  Tobacco use disorder Counseled on need for cessation. -- Nicotine patch  DVT prophylaxis: heparin injection 5,000 Units Start: 03/23/22 1715    Code Status: Full Code Family Communication: No family present at bedside this morning  Disposition Plan:  Level of care: Telemetry Medical Status is: Observation The patient remains OBS appropriate and will d/c before 2 midnights.    Consultants:  Nephrology  Procedures:  None  Antimicrobials:  None   Subjective: Patient seen examined bedside, currently in hemodialysis.  Admitted earlier yesterday, and overnight shortness of breath has significantly progressed and was placed on BiPAP overnight.  Initially tolerated but then due to anxiousness he pulled this off.  Currently on 4 L nasal cannula with  significant volume overload.  No other specific questions or concerns at this time.  Denies headache, no dizziness, no chest pain, no palpitations, no abdominal pain, no fever/chills/night sweats, no  nausea/vomiting/diarrhea, no focal weakness, no fatigue, no paresthesias.  No acute events overnight per nurse staff.  Objective: Vitals:   03/24/22 0900 03/24/22 0930 03/24/22 1000 03/24/22 1030  BP: 135/60 (!) 157/85 (!) 173/142 (!) 178/128  Pulse: 92  94 92  Resp: (!) '24 12 17 '$ (!) 23  Temp:      TempSrc:      SpO2: 100% 100% 100% 100%  Weight:      Height:        Intake/Output Summary (Last 24 hours) at 03/24/2022 1047 Last data filed at 03/23/2022 1943 Gross per 24 hour  Intake --  Output 200 ml  Net -200 ml   Filed Weights   03/23/22 0559  Weight: 96 kg    Examination:  Physical Exam: GEN: NAD, alert and oriented x 3, chronically ill appearance, appears older than stated age HEENT: NCAT, PERRL, EOMI, sclera clear, MMM PULM: Diminished breath sounds bilateral bases with crackles, no wheezing, slightly increased respiratory effort without accessory muscle use, on 4 L nasal cannula. CV: RRR w/o M/G/R GI: abd soft, NTND, NABS, no R/G/M MSK: 2+ pitting edema bilateral lower extremities, moves all extremities independently, noted left BKA NEURO: CN II-XII intact, no focal deficits, sensation to light touch intact PSYCH: Depressed mood, flat affect Integumentary: dry/intact, no rashes or wounds    Data Reviewed: I have personally reviewed following labs and imaging studies  CBC: Recent Labs  Lab 03/23/22 0612 03/24/22 0520  WBC 5.2 5.3  NEUTROABS 3.1  --   HGB 7.7* 7.1*  HCT 24.1* 22.5*  MCV 105.7* 106.6*  PLT 123* 419*   Basic Metabolic Panel: Recent Labs  Lab 03/23/22 0612 03/24/22 0520  NA 132* 133*  K 3.9 3.9  CL 94* 95*  CO2 26 24  GLUCOSE 123* 133*  BUN 29* 34*  CREATININE 6.84* 8.11*  CALCIUM 8.9 8.7*   GFR: Estimated Creatinine Clearance: 11.2 mL/min (A) (by C-G formula based on SCr of 8.11 mg/dL (H)). Liver Function Tests: Recent Labs  Lab 03/23/22 0612  AST 57*  ALT 17  ALKPHOS 122  BILITOT 0.9  PROT 8.7*  ALBUMIN 1.9*   No  results for input(s): "LIPASE", "AMYLASE" in the last 168 hours. No results for input(s): "AMMONIA" in the last 168 hours. Coagulation Profile: No results for input(s): "INR", "PROTIME" in the last 168 hours. Cardiac Enzymes: No results for input(s): "CKTOTAL", "CKMB", "CKMBINDEX", "TROPONINI" in the last 168 hours. BNP (last 3 results) No results for input(s): "PROBNP" in the last 8760 hours. HbA1C: No results for input(s): "HGBA1C" in the last 72 hours. CBG: No results for input(s): "GLUCAP" in the last 168 hours. Lipid Profile: No results for input(s): "CHOL", "HDL", "LDLCALC", "TRIG", "CHOLHDL", "LDLDIRECT" in the last 72 hours. Thyroid Function Tests: No results for input(s): "TSH", "T4TOTAL", "FREET4", "T3FREE", "THYROIDAB" in the last 72 hours. Anemia Panel: No results for input(s): "VITAMINB12", "FOLATE", "FERRITIN", "TIBC", "IRON", "RETICCTPCT" in the last 72 hours. Sepsis Labs: No results for input(s): "PROCALCITON", "LATICACIDVEN" in the last 168 hours.  Recent Results (from the past 240 hour(s))  Resp Panel by RT-PCR (Flu A&B, Covid) Anterior Nasal Swab     Status: None   Collection Time: 03/23/22  1:20 PM   Specimen: Anterior Nasal Swab  Result Value Ref Range Status   SARS Coronavirus 2  by RT PCR NEGATIVE NEGATIVE Final    Comment: (NOTE) SARS-CoV-2 target nucleic acids are NOT DETECTED.  The SARS-CoV-2 RNA is generally detectable in upper respiratory specimens during the acute phase of infection. The lowest concentration of SARS-CoV-2 viral copies this assay can detect is 138 copies/mL. A negative result does not preclude SARS-Cov-2 infection and should not be used as the sole basis for treatment or other patient management decisions. A negative result may occur with  improper specimen collection/handling, submission of specimen other than nasopharyngeal swab, presence of viral mutation(s) within the areas targeted by this assay, and inadequate number of  viral copies(<138 copies/mL). A negative result must be combined with clinical observations, patient history, and epidemiological information. The expected result is Negative.  Fact Sheet for Patients:  EntrepreneurPulse.com.au  Fact Sheet for Healthcare Providers:  IncredibleEmployment.be  This test is no t yet approved or cleared by the [place] FDA and  has been authorized for detection and/or diagnosis of SARS-CoV-2 by FDA under an Emergency Use Authorization (EUA). This EUA will remain  in effect (meaning this test can be used) for the duration of the COVID-19 declaration under Section 564(b)(1) of the Act, 21 U.S.C.section 360bbb-3(b)(1), unless the authorization is terminated  or revoked sooner.       Influenza A by PCR NEGATIVE NEGATIVE Final   Influenza B by PCR NEGATIVE NEGATIVE Final    Comment: (NOTE) The Xpert Xpress SARS-CoV-2/FLU/RSV plus assay is intended as an aid in the diagnosis of influenza from Nasopharyngeal swab specimens and should not be used as a sole basis for treatment. Nasal washings and aspirates are unacceptable for Xpert Xpress SARS-CoV-2/FLU/RSV testing.  Fact Sheet for Patients: EntrepreneurPulse.com.au  Fact Sheet for Healthcare Providers: IncredibleEmployment.be  This test is not yet approved or cleared by the [place] FDA and has been authorized for detection and/or diagnosis of SARS-CoV-2 by FDA under an Emergency Use Authorization (EUA). This EUA will remain in effect (meaning this test can be used) for the duration of the COVID-19 declaration under Section 564(b)(1) of the Act, 21 U.S.C. section 360bbb-3(b)(1), unless the authorization is terminated or revoked.  Performed at [facility], [place] [address], [place], [place] [address]          Radiology Studies: DG Chest Port 1 View  Result Date: 03/23/2022 CLINICAL DATA:  Shortness of  breath EXAM: PORTABLE CHEST 1 VIEW COMPARISON:  Film from earlier in the same day. FINDINGS: Cardiac shadow is enlarged but stable. Aortic calcifications are seen. Bilateral pleural effusions are noted right greater than left. Bibasilar airspace opacities are noted. No pneumothorax is seen. IMPRESSION: Stable appearance of the chest when compared with the earlier exam. Electronically Signed   By: Inez Catalina M.D.   On: 03/23/2022 21:16   DG Chest 2 View  Result Date: 03/23/2022 CLINICAL DATA:  Leg swelling and shortness of breath. EXAM: CHEST - 2 VIEW COMPARISON:  03/11/2022 FINDINGS: Stable cardiomediastinal contours. Lung volumes are low. Bilateral pleural effusions are noted right greater than left. Mild diffuse increase interstitial markings concerning for edema. Similar appearance of left mid and bilateral lower lobe opacities. IMPRESSION: 1. No change in aeration to the lungs compared with previous exam. 2. Persistent bilateral pleural effusions and interstitial edema. Electronically Signed   By: Kerby Moors M.D.   On: 03/23/2022 07:20        Scheduled Meds:  amLODipine  5 mg Oral Daily   atorvastatin  40 mg Oral Daily   Chlorhexidine Gluconate  Cloth  6 each Topical Q0600   gabapentin  300 mg Oral TID   heparin  5,000 Units Subcutaneous Q8H   pantoprazole  40 mg Oral BID AC   sodium chloride flush  3 mL Intravenous Q12H   Continuous Infusions:   LOS: 0 days    Time spent: 52 minutes spent on chart review, discussion with nursing staff, consultants, updating family and interview/physical exam; more than 50% of that time was spent in counseling and/or coordination of care.    Taytum Scheck J [place] DO [facility] Available via Epic secure chat 7am-7pm After these hours, please refer to coverage provider listed on [email or web address] 03/24/2022, 10:47 AM

## 2022-03-24 NOTE — ED Notes (Signed)
Pt having increasing work of breathing, albuterol nebulizer given w/o improvement. RT to bedside, MD notified.

## 2022-03-24 NOTE — ED Notes (Signed)
Pt's work of breathing decreased, overall status has improved, pt tolerating bipap well. Pt asleep.

## 2022-03-24 NOTE — ED Notes (Signed)
Brother Jaylen Knope [phone] would like an update asap and to talk to his brother

## 2022-03-24 NOTE — Progress Notes (Signed)
[facility] Progress Note  Subjective: pt seen in HD unit. Main c/o is SOB w/ exertion, no energy and can't move too much for SOB.   Vitals:   03/24/22 0807 03/24/22 0830 03/24/22 0900 03/24/22 0930  BP:  124/70 135/60 (!) 157/85  Pulse:   92   Resp:  (!) 24 (!) 24 12  Temp:      TempSrc:      SpO2: 96% 96% 100% 100%  Weight:      Height:        Exam: General: Well developed, alert male in NAD Lungs: On O2 via . Rales L base, R clear Heart: RRR with normal S1, S2. No murmurs, rubs, or gallops appreciated. Abdomen: Soft, non-tender, non-distended with normoactive bowel sounds. No rebound/guarding. No obvious abdominal masses. Ext: 1-2+ pitting edema bilateral LE's Neuro: Alert and oriented X 3 Dialysis Access: LUE AVF + bruit    OP HD: [place] MWF 4h  400/1.5   98.8kg  2/2 bath  AVF  15ga  Hep none - mircera 225 q2, last 11/17, due 12/01 - hectorol 4 ug IV tiw   Assessment/ Plan: Shortness of breath/vol overload - due to high fluid gains and missed/shortened dialysis. May also have a viral URI with reported nasal congestion. Getting HD this morning, max UF goal. ESRD - MWF schedule, missed/shortened HD this week. Reinforced importance of HD compliance. Getting 1st IP HD this morning upstairs.  HTN/volume: as above, UFG 4.4 L w/ HD today.   Anemia: Hgb chronically low with recurrent GI bleeds. On max dose ESA, next dose due 12/01.   Metabolic bone disease: CCa high (low albumin), holding VDRA for now. Does not take binders outpatient   Nutrition:  Will need renal diet with fluid restrictions. Pt informed he also needs to limit ice T2DM: management per primary team   Rob Charizma Gardiner 03/24/2022, 9:55 AM   Recent Labs  Lab 03/23/22 0612 03/24/22 0520  HGB 7.7* 7.1*  ALBUMIN 1.9*  --   CALCIUM 8.9 8.7*  CREATININE 6.84* 8.11*  K 3.9 3.9   No results for input(s): "IRON", "TIBC", "FERRITIN" in the last 168 hours. Inpatient medications:  amLODipine  5 mg  Oral Daily   atorvastatin  40 mg Oral Daily   Chlorhexidine Gluconate Cloth  6 each Topical Q0600   gabapentin  300 mg Oral TID   heparin  5,000 Units Subcutaneous Q8H   pantoprazole  40 mg Oral BID AC   sodium chloride flush  3 mL Intravenous Q12H    acetaminophen **OR** acetaminophen, albuterol, benzonatate, calcium carbonate (dosed in mg elemental calcium), camphor-menthol **AND** hydrOXYzine, docusate sodium, feeding supplement (NEPRO CARB STEADY), hydrALAZINE, nicotine, ondansetron **OR** ondansetron (ZOFRAN) IV, sorbitol, traZODone, zolpidem

## 2022-03-24 NOTE — Progress Notes (Signed)
TRH night cross cover note:   I personally spoke with on-call nephrology (Dr. Johnney Ou) requesting urgent HD this AM for progressive volume, not responding to IV diuresis efforts, in this patient with ESRD on HD, who was admitted yesterday for acute volume overload. Nephrology was consulted yesterday, with plan for HD this AM. However, in setting of progressive sob a/w progressive increase in wob, I contacted on-call nephrology this AM to request expedited HD relative to the originally scheduled timeframe.     Babs Bertin, DO Hospitalist

## 2022-03-24 NOTE — Progress Notes (Signed)
Patient has gone up to dialysis on 4L [place].  Bipap removed from room and cleaned.

## 2022-03-24 NOTE — Progress Notes (Signed)
RT placed patient on BIPAP HS due too increased WOB. Patient is tolerating well at this time.

## 2022-03-24 NOTE — Progress Notes (Addendum)
Received patient in bed to unit.  Alert and oriented.  Informed consent signed and in chart.   Treatment initiated: 0755 Treatment completed: 1205  Patient tolerated well.  Transported back to the ED on oxygen 3 liters nasal cannula Alert, without acute distress.  Hand-off given to patient's nurse.   Access used: fistula left arm  Access issues: none  Total UF removed: 4500 ml Medication(s) given: tessalon capsule Post HD weight:    [facility]  03/24/22 1205  Vitals  Temp 98.1 F (36.7 C)  Temp Source Oral  BP (!) 148/52  MAP (mmHg) 80  BP Location Right Wrist  BP Method Automatic  Patient Position (if appropriate) Lying  Pulse Rate 90  Pulse Rate Source Monitor  ECG Heart Rate 91  Resp 14  Oxygen Therapy  SpO2 100 %  MEWS Score  MEWS Temp 0  MEWS Systolic 0  MEWS Pulse 0  MEWS RR 0  MEWS LOC 0  MEWS Score 0  MEWS Score Color Nyoka Cowden

## 2022-03-24 NOTE — Progress Notes (Signed)
Pt receives out-pt HD at [facility] on MWF. Will assist as needed.   Darden Flemister Renal Navigator [phone] 

## 2022-03-24 NOTE — ED Notes (Signed)
Pt removed nasal cannula.  SpO2 on RA 89%.  Placed back on 3L via [place].  SpO2 now 97%.

## 2022-03-25 ENCOUNTER — Telehealth (INDEPENDENT_AMBULATORY_CARE_PROVIDER_SITE_OTHER): Payer: Medicare Other | Admitting: Primary Care

## 2022-03-25 DIAGNOSIS — E8770 Fluid overload, unspecified: Secondary | ICD-10-CM | POA: Diagnosis not present

## 2022-03-25 DIAGNOSIS — N289 Disorder of kidney and ureter, unspecified: Secondary | ICD-10-CM | POA: Diagnosis not present

## 2022-03-25 LAB — IRON AND TIBC
Iron: 91 ug/dL (ref 45–182)
Saturation Ratios: 40 % — ABNORMAL HIGH (ref 17.9–39.5)
TIBC: 228 ug/dL — ABNORMAL LOW (ref 250–450)
UIBC: 137 ug/dL

## 2022-03-25 LAB — RENAL FUNCTION PANEL
Albumin: 1.9 g/dL — ABNORMAL LOW (ref 3.5–5.0)
Anion gap: 15 (ref 5–15)
BUN: 20 mg/dL (ref 8–23)
CO2: 26 mmol/L (ref 22–32)
Calcium: 8.8 mg/dL — ABNORMAL LOW (ref 8.9–10.3)
Chloride: 89 mmol/L — ABNORMAL LOW (ref 98–111)
Creatinine, Ser: 5.13 mg/dL — ABNORMAL HIGH (ref 0.61–1.24)
GFR, Estimated: 12 mL/min — ABNORMAL LOW (ref >60)
Glucose, Bld: 102 mg/dL — ABNORMAL HIGH (ref 70–99)
Phosphorus: 3.3 mg/dL (ref 2.5–4.6)
Potassium: 3.7 mmol/L (ref 3.5–5.1)
Sodium: 130 mmol/L — ABNORMAL LOW (ref 135–145)

## 2022-03-25 LAB — CBC
HCT: 22 % — ABNORMAL LOW (ref 39.0–52.0)
Hemoglobin: 7.2 g/dL — ABNORMAL LOW (ref 13.0–17.0)
MCH: 34.4 pg — ABNORMAL HIGH (ref 26.0–34.0)
MCHC: 32.7 g/dL (ref 30.0–36.0)
MCV: 105.3 fL — ABNORMAL HIGH (ref 80.0–100.0)
Platelets: 107 10*3/uL — ABNORMAL LOW (ref 150–400)
RBC: 2.09 MIL/uL — ABNORMAL LOW (ref 4.22–5.81)
RDW: 18.7 % — ABNORMAL HIGH (ref 11.5–15.5)
WBC: 5 10*3/uL (ref 4.0–10.5)
nRBC: 0 % (ref 0.0–0.2)

## 2022-03-25 LAB — RETICULOCYTES
Immature Retic Fract: 31.9 % — ABNORMAL HIGH (ref 2.3–15.9)
RBC.: 2.09 MIL/uL — ABNORMAL LOW (ref 4.22–5.81)
Retic Count, Absolute: 136.3 10*3/uL (ref 19.0–186.0)
Retic Ct Pct: 6.5 % — ABNORMAL HIGH (ref 0.4–3.1)

## 2022-03-25 LAB — FOLATE: Folate: 10.2 ng/mL (ref >5.9)

## 2022-03-25 LAB — FERRITIN: Ferritin: 1005 ng/mL — ABNORMAL HIGH (ref 24–336)

## 2022-03-25 LAB — VITAMIN B12: Vitamin B-12: 509 pg/mL (ref 180–914)

## 2022-03-25 MED ORDER — CHLORHEXIDINE GLUCONATE CLOTH 2 % EX PADS: 6.0000 | MEDICATED_PAD | Freq: Every day | CUTANEOUS | Status: DC

## 2022-03-25 MED ORDER — CHLORHEXIDINE GLUCONATE CLOTH 2 % EX PADS
6.0000 | MEDICATED_PAD | Freq: Every day | CUTANEOUS | Status: DC
  Administered 2022-03-25: 6 via TOPICAL

## 2022-03-25 MED ORDER — ALBUTEROL SULFATE (2.5 MG/3ML) 0.083% IN NEBU
2.5000 mg | INHALATION_SOLUTION | Freq: Four times a day (QID) | RESPIRATORY_TRACT | Status: DC
  Administered 2022-03-25 – 2022-03-28 (×9): 2.5 mg via RESPIRATORY_TRACT
  Filled 2022-03-25 (×8): qty 3

## 2022-03-25 NOTE — Plan of Care (Signed)
°  Problem: Education: °Goal: Knowledge of General Education information will improve °Description: Including pain rating scale, medication(s)/side effects and non-pharmacologic comfort measures °Outcome: Progressing °  °Problem: Clinical Measurements: °Goal: Respiratory complications will improve °Outcome: Progressing °  °Problem: Clinical Measurements: °Goal: Cardiovascular complication will be avoided °Outcome: Progressing °  °Problem: Activity: °Goal: Risk for activity intolerance will decrease °Outcome: Progressing °  °Problem: Nutrition: °Goal: Adequate nutrition will be maintained °Outcome: Progressing °  °Problem: Coping: °Goal: Level of anxiety will decrease °Outcome: Progressing °  °Problem: Pain Managment: °Goal: General experience of comfort will improve °Outcome: Progressing °  °Problem: Safety: °Goal: Ability to remain free from injury will improve °Outcome: Progressing °  °

## 2022-03-25 NOTE — Progress Notes (Signed)
PROGRESS NOTE    Daniel [name]  YIR:[id number] DOB: 21-Aug-1960 DOA: 03/23/2022 PCP: [name], NP    Brief Narrative:   Daniel [name] is a 61 y.o. 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  Patient reports onset 4-5 days ago and edema has progressed to the point he is unable to wear his prosthetic leg.  Patient denies any missed HD sessions and does not report any dietary indiscretion.  Patient also with nonproductive cough.  In the ED, temperature 98.6 3 Fahrenheit, HR 93, RR 18, BP 165/83, SpO2 94% on 4 L nasal cannula.  Sodium 132, potassium 3.9, chloride 94, CO2 26, glucose 123, BUN 29, creatinine 6.84.  AST 57, ALT 17.  WBC 5.2, hemoglobin 7.7, platelets 123.  Cova-19 PCR negative.  Influenza A/B PCR negative.  Chest x-ray with bilateral pleural effusions, right greater than left with diffuse interstitial markings concerning for edema.  Patient was given furosemide 120 mg IV x 1.  Nephrology was consulted.  TRH consulted for admission for further evaluation and management of respiratory failure secondary to volume overload in the setting of ESRD on HD.  Assessment & Plan:   Acute hypoxic respiratory failure, POA Hypervolemia in the setting of ESRD on HD Patient presenting to the ED with progressive shortness of breath and lower extremity edema.  Reports compliance with HD and no dietary indiscretions. -- Nephrology following, appreciate assistance --Net negative 4.7 L since admission -- Fluid restriction 1200 mL/day -- Strict I's and O's and daily weights -- Continue supplemental oxygen, maintain SpO2 greater than 92%, currently on 3 L nasal  cannula  Hyponatremia Sodium 132 on admission, likely secondary to hypervolemic hyponatremia in the setting of volume overload. -- Continue management with HD -- BMP in the a.m.  History of recurrent GI bleed secondary to duodenal ulcer/AVM/varices Hx hepatocellular carcinoma EtOH cirrhosis Follows with medical oncology, [name] in regards to his hepatocellular carcinoma.  Underwent Y90 procedure on 11/28/2021 by interventional radiology with no plans for systemic therapy at this point. -- Hemoglobin 7.7>7.1>7.2 -- Protonix 40 mg p.o. twice daily -- Repeat CBC in the a.m.  Anemia secondary to chronic kidney disease Anemia panel with iron 91, TIBC low at 228, ferritin high at 1005, folate and B12 within normal limits. -- Hemoglobin 7.7> 7.1>7.2 -- Transfuse for hemoglobin less than 7.0. -- Repeat CBC in a.m.  DM2 Hemoglobin C 5.2, well-controlled.  Diet controlled at home.  Hyperlipidemia: Continue atorvastatin 40 mg p.o. daily  Essential hypertension:  --Continue amlodipine 5 mg p.o. daily -- Hydralazine 5 mg IV every 4 hours as needed SBP greater than 180  Tobacco use disorder Counseled on need for cessation. -- Nicotine patch  DVT prophylaxis: heparin injection 5,000 Units Start: 03/23/22 1715    Code Status: Full Code Family Communication: No family present at bedside this morning  Disposition Plan:  Level of care: Telemetry Medical Status is: Inpatient Remains inpatient appropriate because: Continues with volume overload needs further hemodialysis and titration off of supplemental oxygen   Consultants:  Nephrology  Procedures:  None  Antimicrobials:  None   Subjective: Patient seen examined bedside, lying in bed.  RT present.  Continues to complain of  shortness of breath.  Continues with significant lower extremity edema, remains on O2 at 3 L per nasal cannula which is improved since yesterday.  Received HD yesterday.  Likely needs further hemodialysis for  further fluid removal.  No other specific questions or concerns at this time.  Denies headache, no dizziness, no chest pain, no palpitations, no abdominal pain, no fever/chills/night sweats, no nausea/vomiting/diarrhea, no focal weakness, no fatigue, no paresthesias.  No acute events overnight per nurse staff.  Objective: Vitals:   03/25/22 0540 03/25/22 0755 03/25/22 0802 03/25/22 0821  BP: 135/79 (!) 164/104 (!) 164/104   Pulse: 95 92 94   Resp: '17 16 18   '$ Temp: 98.6 F (37 C) 98.2 F (36.8 C) 98.2 F (36.8 C)   TempSrc: Oral Oral Oral   SpO2: 90% 95% 97% 100%  Weight:      Height:        Intake/Output Summary (Last 24 hours) at 03/25/2022 1023 Last data filed at 03/24/2022 1205 Gross per 24 hour  Intake --  Output 4500 ml  Net -4500 ml   Filed Weights   03/23/22 0559 03/25/22 0027  Weight: 96 kg 100.6 kg    Examination:  Physical Exam: GEN: NAD, alert and oriented x 3, chronically ill appearance, appears older than stated age HEENT: NCAT, PERRL, EOMI, sclera clear, MMM PULM: Diminished breath sounds bilateral bases with crackles, no wheezing, slightly increased respiratory effort without accessory muscle use, on 3 L nasal cannula. CV: RRR w/o M/G/R GI: abd soft, NTND, NABS, no R/G/M MSK: 2+ pitting edema bilateral lower extremities, moves all extremities independently, noted left BKA NEURO: CN II-XII intact, no focal deficits, sensation to light touch intact PSYCH: Depressed mood, flat affect Integumentary: dry/intact, no rashes or wounds    Data Reviewed: I have personally reviewed following labs and imaging studies  CBC: Recent Labs  Lab 03/23/22 0612 03/24/22 0520 03/25/22 0126  WBC 5.2 5.3 5.0  NEUTROABS 3.1  --   --   HGB 7.7* 7.1* 7.2*  HCT 24.1* 22.5* 22.0*  MCV 105.7* 106.6* 105.3*  PLT 123* 109* 086*   Basic Metabolic Panel: Recent Labs  Lab 03/23/22 0612 03/24/22 0520 03/25/22 0126  NA 132* 133* 130*  K 3.9 3.9 3.7  CL 94* 95* 89*  CO2  '26 24 26  '$ GLUCOSE 123* 133* 102*  BUN 29* 34* 20  CREATININE 6.84* 8.11* 5.13*  CALCIUM 8.9 8.7* 8.8*  PHOS  --   --  3.3   GFR: Estimated Creatinine Clearance: 18 mL/min (A) (by C-G formula based on SCr of 5.13 mg/dL (H)). Liver Function Tests: Recent Labs  Lab 03/23/22 0612 03/25/22 0126  AST 57*  --   ALT 17  --   ALKPHOS 122  --   BILITOT 0.9  --   PROT 8.7*  --   ALBUMIN 1.9* 1.9*   No results for input(s): "LIPASE", "AMYLASE" in the last 168 hours. No results for input(s): "AMMONIA" in the last 168 hours. Coagulation Profile: No results for input(s): "INR", "PROTIME" in the last 168 hours. Cardiac Enzymes: No results for input(s): "CKTOTAL", "CKMB", "CKMBINDEX", "TROPONINI" in the last 168 hours. BNP (last 3 results) No results for input(s): "PROBNP" in the last 8760 hours. HbA1C: No results for input(s): "HGBA1C" in the last 72 hours. CBG: No results for input(s): "GLUCAP" in the last 168 hours. Lipid Profile: No results for input(s): "CHOL", "HDL", "LDLCALC", "TRIG", "CHOLHDL", "LDLDIRECT" in the last 72 hours. Thyroid Function Tests: No results for  input(s): "TSH", "T4TOTAL", "FREET4", "T3FREE", "THYROIDAB" in the last 72 hours. Anemia Panel: Recent Labs    03/25/22 0126  VITAMINB12 509  FOLATE 10.2  FERRITIN 1,005*  TIBC 228*  IRON 91  RETICCTPCT 6.5*   Sepsis Labs: No results for input(s): "PROCALCITON", "LATICACIDVEN" in the last 168 hours.  Recent Results (from the past 240 hour(s))  Resp Panel by RT-PCR (Flu A&B, Covid) Anterior Nasal Swab     Status: None   Collection Time: 03/23/22  1:20 PM   Specimen: Anterior Nasal Swab  Result Value Ref Range Status   SARS Coronavirus 2 by RT PCR NEGATIVE NEGATIVE Final    Comment: (NOTE) SARS-CoV-2 target nucleic acids are NOT DETECTED.  The SARS-CoV-2 RNA is generally detectable in upper respiratory specimens during the acute phase of infection. The lowest concentration of SARS-CoV-2 viral copies this  assay can detect is 138 copies/mL. A negative result does not preclude SARS-Cov-2 infection and should not be used as the sole basis for treatment or other patient management decisions. A negative result may occur with  improper specimen collection/handling, submission of specimen other than nasopharyngeal swab, presence of viral mutation(s) within the areas targeted by this assay, and inadequate number of viral copies(<138 copies/mL). A negative result must be combined with clinical observations, patient history, and epidemiological information. The expected result is Negative.  Fact Sheet for Patients:  EntrepreneurPulse.com.au  Fact Sheet for Healthcare Providers:  IncredibleEmployment.be  This test is no t yet approved or cleared by the [place] FDA and  has been authorized for detection and/or diagnosis of SARS-CoV-2 by FDA under an Emergency Use Authorization (EUA). This EUA will remain  in effect (meaning this test can be used) for the duration of the COVID-19 declaration under Section 564(b)(1) of the Act, 21 U.S.C.section 360bbb-3(b)(1), unless the authorization is terminated  or revoked sooner.       Influenza A by PCR NEGATIVE NEGATIVE Final   Influenza B by PCR NEGATIVE NEGATIVE Final    Comment: (NOTE) The Xpert Xpress SARS-CoV-2/FLU/RSV plus assay is intended as an aid in the diagnosis of influenza from Nasopharyngeal swab specimens and should not be used as a sole basis for treatment. Nasal washings and aspirates are unacceptable for Xpert Xpress SARS-CoV-2/FLU/RSV testing.  Fact Sheet for Patients: EntrepreneurPulse.com.au  Fact Sheet for Healthcare Providers: IncredibleEmployment.be  This test is not yet approved or cleared by the [place] FDA and has been authorized for detection and/or diagnosis of SARS-CoV-2 by FDA under an Emergency Use Authorization (EUA). This EUA will  remain in effect (meaning this test can be used) for the duration of the COVID-19 declaration under Section 564(b)(1) of the Act, 21 U.S.C. section 360bbb-3(b)(1), unless the authorization is terminated or revoked.  Performed at [facility], [place] [name] [place], [place] [address]          Radiology Studies: DG Chest Port 1 View  Result Date: 03/23/2022 CLINICAL DATA:  Shortness of breath EXAM: PORTABLE CHEST 1 VIEW COMPARISON:  Film from earlier in the same day. FINDINGS: Cardiac shadow is enlarged but stable. Aortic calcifications are seen. Bilateral pleural effusions are noted right greater than left. Bibasilar airspace opacities are noted. No pneumothorax is seen. IMPRESSION: Stable appearance of the chest when compared with the earlier exam. Electronically Signed   By: Inez Catalina M.D.   On: 03/23/2022 21:16        Scheduled Meds:  amLODipine  5 mg Oral Daily   atorvastatin  40 mg  Oral Daily   Chlorhexidine Gluconate Cloth  6 each Topical Q0600   gabapentin  300 mg Oral TID   heparin  5,000 Units Subcutaneous Q8H   pantoprazole  40 mg Oral BID AC   sodium chloride flush  3 mL Intravenous Q12H   Continuous Infusions:   LOS: 1 day    Time spent: 52 minutes spent on chart review, discussion with nursing staff, consultants, updating family and interview/physical exam; more than 50% of that time was spent in counseling and/or coordination of care.    Napolean Sia J [place] DO [facility] Available via Epic secure chat 7am-7pm After these hours, please refer to coverage provider listed on [email or web address] 03/25/2022, 10:23 AM

## 2022-03-25 NOTE — Progress Notes (Signed)
[facility] Progress Note  Subjective: pt seen in HD unit. Main c/o is SOB w/ exertion, no energy and can't move too much for SOB.   Vitals:   03/25/22 0755 03/25/22 0802 03/25/22 0821 03/25/22 1210  BP: (!) 164/104 (!) 164/104  (!) 188/156  Pulse: 92 94  89  Resp: '16 18  18  '$ Temp: 98.2 F (36.8 C) 98.2 F (36.8 C)  98 F (36.7 C)  TempSrc: Oral Oral  Oral  SpO2: 95% 97% 100% 99%  Weight:      Height:        Exam: General: Well developed, alert male in NAD Lungs: On O2 via [place]. Bibasilar faint rales Heart: RRR with normal S1, S2. No RG Abdomen: Soft, non-tender, non-distended with normoactive BS Ext: 1+ pitting edema bilateral LE's Neuro: Alert and oriented X 3 Dialysis Access: LUE AVF + bruit    OP HD: [place] MWF 4h  400/1.5   98.8kg  2/2 bath  AVF  15ga  Hep none - mircera 225 q2, last 11/17, due 12/01 - hectorol 4 ug IV tiw   Assessment/ Plan: Shortness of breath/vol overload - due to high fluid gains and missed/shortened dialysis. May also have a viral URI with reported nasal congestion. Had HD yesterday w/ 4.5 L off. Felt better w/ HD,  but this am is very SOB again. Will try to get him back on HD this afternoon, lower vol further.  ESRD - MWF schedule, missed/shortened HD this week. Reinforced importance of HD compliance. As above.  HTN/volume: as above, getting vol down  Anemia: Hgb chronically low with recurrent GI bleeds. On max dose ESA, next dose due 12/01.   Metabolic bone disease: CCa high (low albumin), holding VDRA for now. Does not take binders outpatient   Nutrition:  Will need renal diet with fluid restrictions. Pt informed he also needs to limit ice T2DM: management per primary team   Rob Jaiel Saraceno 03/25/2022, 12:15 PM   Recent Labs  Lab 03/23/22 0612 03/24/22 0520 03/25/22 0126  HGB 7.7* 7.1* 7.2*  ALBUMIN 1.9*  --  1.9*  CALCIUM 8.9 8.7* 8.8*  PHOS  --   --  3.3  CREATININE 6.84* 8.11* 5.13*  K 3.9 3.9 3.7    Recent Labs   Lab 03/25/22 0126  IRON 91  TIBC 228*  FERRITIN 1,005*   Inpatient medications:  amLODipine  5 mg Oral Daily   atorvastatin  40 mg Oral Daily   Chlorhexidine Gluconate Cloth  6 each Topical Q0600   Chlorhexidine Gluconate Cloth  6 each Topical Q0600   gabapentin  300 mg Oral TID   heparin  5,000 Units Subcutaneous Q8H   pantoprazole  40 mg Oral BID AC   sodium chloride flush  3 mL Intravenous Q12H    acetaminophen **OR** acetaminophen, albuterol, calcium carbonate (dosed in mg elemental calcium), camphor-menthol **AND** hydrOXYzine, docusate sodium, feeding supplement (NEPRO CARB STEADY), guaiFENesin-dextromethorphan, hydrALAZINE, nicotine, ondansetron **OR** ondansetron (ZOFRAN) IV, sorbitol, traZODone, zolpidem

## 2022-03-25 NOTE — Progress Notes (Addendum)
Received patient in bed to unit.  Alert and oriented.  Informed consent signed and in chart.   Treatment initiated: 1614 Treatment completed: 1929  Patient tolerated well.  Transported back to the room  Alert, without acute distress.  Hand-off given to patient's nurse.   Access used: AVF Access issues: none  Total UF removed: 2.6 L Medication(s) given: Robitussin 5 ml po Post HD VS: 140/70 P 96 R 20 Post HD weight: 98.4 KG  [facility]

## 2022-03-25 NOTE — Plan of Care (Signed)
  Problem: Health Behavior/Discharge Planning: Goal: Ability to manage health-related needs will improve Outcome: Progressing   Problem: Education: Goal: Knowledge of General Education information will improve Description: Including pain rating scale, medication(s)/side effects and non-pharmacologic comfort measures Outcome: Progressing   Problem: Nutrition: Goal: Adequate nutrition will be maintained Outcome: Progressing   Problem: Coping: Goal: Level of anxiety will decrease Outcome: Progressing   Problem: Pain Managment: Goal: General experience of comfort will improve Outcome: Progressing   Problem: Safety: Goal: Ability to remain free from injury will improve Outcome: Progressing

## 2022-03-26 ENCOUNTER — Telehealth: Payer: Self-pay | Admitting: *Deleted

## 2022-03-26 DIAGNOSIS — N186 End stage renal disease: Secondary | ICD-10-CM

## 2022-03-26 DIAGNOSIS — J9601 Acute respiratory failure with hypoxia: Secondary | ICD-10-CM

## 2022-03-26 DIAGNOSIS — E8770 Fluid overload, unspecified: Secondary | ICD-10-CM | POA: Diagnosis not present

## 2022-03-26 DIAGNOSIS — Z992 Dependence on renal dialysis: Secondary | ICD-10-CM

## 2022-03-26 DIAGNOSIS — C220 Liver cell carcinoma: Secondary | ICD-10-CM | POA: Diagnosis not present

## 2022-03-26 LAB — RENAL FUNCTION PANEL
Albumin: 1.8 g/dL — ABNORMAL LOW (ref 3.5–5.0)
Anion gap: 9 (ref 5–15)
BUN: 15 mg/dL (ref 8–23)
CO2: 27 mmol/L (ref 22–32)
Calcium: 8.3 mg/dL — ABNORMAL LOW (ref 8.9–10.3)
Chloride: 91 mmol/L — ABNORMAL LOW (ref 98–111)
Creatinine, Ser: 4.07 mg/dL — ABNORMAL HIGH (ref 0.61–1.24)
GFR, Estimated: 16 mL/min — ABNORMAL LOW (ref >60)
Glucose, Bld: 101 mg/dL — ABNORMAL HIGH (ref 70–99)
Phosphorus: 2.7 mg/dL (ref 2.5–4.6)
Potassium: 3.8 mmol/L (ref 3.5–5.1)
Sodium: 127 mmol/L — ABNORMAL LOW (ref 135–145)

## 2022-03-26 LAB — CBC
HCT: 21.3 % — ABNORMAL LOW (ref 39.0–52.0)
Hemoglobin: 7 g/dL — ABNORMAL LOW (ref 13.0–17.0)
MCH: 34.1 pg — ABNORMAL HIGH (ref 26.0–34.0)
MCHC: 32.9 g/dL (ref 30.0–36.0)
MCV: 103.9 fL — ABNORMAL HIGH (ref 80.0–100.0)
Platelets: 113 10*3/uL — ABNORMAL LOW (ref 150–400)
RBC: 2.05 MIL/uL — ABNORMAL LOW (ref 4.22–5.81)
RDW: 18.6 % — ABNORMAL HIGH (ref 11.5–15.5)
WBC: 4.8 10*3/uL (ref 4.0–10.5)
nRBC: 0 % (ref 0.0–0.2)

## 2022-03-26 LAB — PREPARE RBC (CROSSMATCH)

## 2022-03-26 MED ORDER — SODIUM CHLORIDE 0.9% IV SOLUTION: Freq: Once | INTRAVENOUS | Status: DC

## 2022-03-26 MED ORDER — BENZONATATE 100 MG PO CAPS
100.0000 mg | ORAL_CAPSULE | Freq: Once | ORAL | Status: AC
  Administered 2022-03-26: 100 mg via ORAL
  Filled 2022-03-26: qty 1

## 2022-03-26 NOTE — Progress Notes (Signed)
Someone had, again, already "completed' the HD orders from their work list so I can't modify the orders in EPIC. However, per Dr. Jonnie Finner VO, change today's HD tx time to 3hrs.

## 2022-03-26 NOTE — Patient Outreach (Signed)
Telephone outreach to [facility] to follow up on pt application for housing. Left message for Kerri Perches to return my call.  [name], MSN, [facility] Nurse Practitioner [facility] [phone]

## 2022-03-26 NOTE — Progress Notes (Signed)
Received patient in bed to unit.  Alert and oriented.  Informed consent signed and in chart.   Treatment initiated: 0940 Treatment completed: 1335  Patient tolerated well.  Transported back to the room  Alert, without acute distress.  Hand-off given to patient's nurse.   Access used: [place] Access issues: n/a  Total UF removed: 3500 Medication(s) given: Post HD weight: 95.4kg      03/26/22 1330  Vitals  Temp 98.2 F (36.8 C)  Temp Source Oral  BP (!) 122/59  MAP (mmHg) 79  BP Location Right Wrist  BP Method Automatic  Patient Position (if appropriate) Lying  Pulse Rate 90  Pulse Rate Source Monitor  ECG Heart Rate 89  Oxygen Therapy  SpO2 100 %  O2 Device Nasal Cannula  O2 Flow Rate (L/min) 3 L/min  During Treatment Monitoring  HD Safety Checks Performed Yes  Intra-Hemodialysis Comments Tolerated well;Tx completed  Post Treatment  Dialyzer Clearance Clear  Duration of HD Treatment -hour(s) 3 hour(s)  Liters Processed 68.1  Fluid Removed (mL) 3500 mL  Tolerated HD Treatment Yes  AVG/AVF Arterial Site Held (minutes) 7 minutes  AVG/AVF Venous Site Held (minutes) 7 minutes  Fistula / Graft Left Upper arm Arteriovenous fistula  No placement date or time found.   Placed prior to admission: Yes  Orientation: Left  Access Location: Upper arm  Access Type: Arteriovenous fistula  Site Condition No complications  Fistula / Graft Assessment Present;Thrill;Bruit  Status Deaccessed        [facility]

## 2022-03-26 NOTE — Progress Notes (Addendum)
Pt frequently asks for ice. Day RN informed this RN that patient was educated on fluid restriction and ice volumes. Pt informed of fluid restrictions and allotments of ice during PM shift and assessment. Crackles heard in R chest. Pt continues to feel SOB with audible wheezing. Pt continues to ask for ice often from various staff members. Pt reminded of fluid overload. Pt dismissive of concerns.   Pt has been awake all night, restless, pulling off telemetry and requesting ice stating "I will go to bed if you give me more ice". Pt feels SOB and frequent wheezing. Pt re-educated on fluid overload and importance of fluid restriction. PRN neb treatment requested

## 2022-03-26 NOTE — Progress Notes (Signed)
Pt doe snot wear cpap at home. Pt is resting on 4l [place]. Vitals stable.

## 2022-03-26 NOTE — Evaluation (Signed)
Physical Therapy Evaluation Patient Details Name: Daniel [name] MRN: [id number] DOB: [birth year] Today's Date: 03/26/2022  History of Present Illness  Pt is a 61 y/o M admitted on 03/23/22 after presenting to the ED with c/o progressive SOB & LE edema. Chest x-ray with bilateral pleural effusions, right greater than left with diffuse interstitial markings concerning for edema. Pt is being treated for respiratory failure secondary to volume overload in the setting of ESRD on HD. 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 Pt reports prior to admission he was mod I without AD with LLE prosthesis, without prosthesis he was mod I for squat pivot to w/c, living alone with ramped entrance at back entrance of house. On this date, pt demonstrates good sitting balance, initiating & completing squat pivot bed>recliner on R with mod I. Pt does not have his prosthesis in room with him so ambulation attempts deferred at this time. Pt with audible wheezing heard during breathing while on 3L/min via nasal cannula. Anticipate pt can d/c home with HHPT f/u.    Recommendations for follow up therapy are one component of a multi-disciplinary discharge planning process, led by the attending physician.  Recommendations may be updated based on patient status, additional functional criteria and insurance authorization.  Follow Up Recommendations Home health PT      Assistance Recommended at Discharge PRN  Patient can return home with the following  A little help with walking and/or transfers;Assistance with cooking/housework;A little help with bathing/dressing/bathroom    Equipment Recommendations None recommended by PT  Recommendations for Other Services       Functional Status  Assessment Patient has had a recent decline in their functional status and demonstrates the ability to make significant improvements in function in a reasonable and predictable amount of time.     Precautions / Restrictions Precautions Precautions: Fall Precaution Comments: L BKA Restrictions Weight Bearing Restrictions: No      Mobility  Bed Mobility               General bed mobility comments: not tested, pt received sitting EOB, left in recliner    Transfers Overall transfer level: Needs assistance   Transfers: Bed to chair/wheelchair/BSC       Squat pivot transfers: Modified independent (Device/Increase time)     General transfer comment: Pt initiates & completes squat pivot bed>recliner on R without AD.    Ambulation/Gait                  Stairs            Wheelchair Mobility    Modified Rankin (Stroke Patients Only)       Balance Overall balance assessment: Needs assistance Sitting-balance support: Bilateral upper extremity supported, Feet supported Sitting balance-Leahy Scale: Good                                       Pertinent Vitals/Pain Pain Assessment Pain Assessment: No/denies pain    Home Living Family/patient expects to be discharged to:: Private residence Living Arrangements: Alone Available Help at Discharge: Friend(s);Available PRN/intermittently Type of Home: House Home Access: Ramped entrance (at back door)  Home Equipment: Wheelchair - Publishing copy (2 wheels)      Prior Function Prior Level of Function : Driving;Independent/Modified Independent             Mobility Comments: Pt reports he's ambulatory without AD when using his LLE prosthetic, otherwise, pt is mod I for squat pivot transfers to w/c.       Hand Dominance        Extremity/Trunk Assessment   Upper Extremity Assessment Upper Extremity Assessment: Overall WFL for tasks assessed    Lower Extremity  Assessment Lower Extremity Assessment: Generalized weakness;Overall WFL for tasks assessed       Communication   Communication: No difficulties  Cognition Arousal/Alertness: Awake/alert Behavior During Therapy: WFL for tasks assessed/performed Overall Cognitive Status: Within Functional Limits for tasks assessed                                          General Comments      Exercises     Assessment/Plan    PT Assessment Patient needs continued PT services  PT Problem List Cardiopulmonary status limiting activity;Decreased activity tolerance;Decreased mobility       PT Treatment Interventions DME instruction;Therapeutic exercise;Gait training;Balance training;Neuromuscular re-education;Functional mobility training;Therapeutic activities;Patient/family education    PT Goals (Current goals can be found in the Care Plan section)  Acute Rehab PT Goals Patient Stated Goal: get better PT Goal Formulation: With patient Time For Goal Achievement: 04/09/22 Potential to Achieve Goals: Good    Frequency Min 3X/week     Co-evaluation               AM-PAC PT "6 Clicks" Mobility  Outcome Measure Help needed turning from your back to your side while in a flat bed without using bedrails?: None Help needed moving from lying on your back to sitting on the side of a flat bed without using bedrails?: None Help needed moving to and from a bed to a chair (including a wheelchair)?: None Help needed standing up from a chair using your arms (e.g., wheelchair or bedside chair)?: A Little Help needed to walk in hospital room?: Total Help needed climbing 3-5 steps with a railing? : Total 6 Click Score: 17    End of Session Equipment Utilized During Treatment: Oxygen Activity Tolerance: Patient tolerated treatment well Patient left: in chair;with chair alarm set;with call bell/phone within reach Nurse Communication: Mobility status PT Visit Diagnosis: Muscle weakness  (generalized) (M62.81);Other abnormalities of gait and mobility (R26.89)    Time: 5638-9373 PT Time Calculation (min) (ACUTE ONLY): 8 min   Charges:   PT Evaluation $PT Eval Low Complexity: [place], PT, DPT 03/26/22, 9:14 AM   Waunita Schooner 03/26/2022, 9:13 AM

## 2022-03-26 NOTE — Progress Notes (Signed)
[facility] Progress Note   Subjective:   Patient seen and examined at bedside during dialysis.  Constant coughing causing alarming of machine.  Improved w/tessalon pearl and tolerating treatment.  Admits to SOB and CP associated w/cough.  Denies n/v/d and abdominal pain.   Objective Vitals:   03/26/22 0953 03/26/22 1030 03/26/22 1044 03/26/22 1100  BP: (!) 149/99 (!) 151/121  (!) 133/49  Pulse: 88 95  96  Resp:      Temp:      TempSrc:      SpO2: 100% 97%  99%  Weight:   98.9 kg   Height:       Physical Exam General:chronically ill appearing male in NAD Heart:RRR, no mrg Lungs:+wheezing, +crackles, nml WOB on O2 Abdomen:soft, NTND Extremities:1+ LE edema Dialysis Access: LU AVF in use   Filed Weights   03/25/22 0027 03/25/22 1555 03/26/22 1044  Weight: 100.6 kg 100.6 kg 98.9 kg    Intake/Output Summary (Last 24 hours) at 03/26/2022 1130 Last data filed at 03/26/2022 0451 Gross per 24 hour  Intake 1205 ml  Output 2725 ml  Net -1520 ml    Additional Objective Labs: Basic Metabolic Panel: Recent Labs  Lab 03/24/22 0520 03/25/22 0126 03/26/22 0421  NA 133* 130* 127*  K 3.9 3.7 3.8  CL 95* 89* 91*  CO2 '24 26 27  '$ GLUCOSE 133* 102* 101*  BUN 34* 20 15  CREATININE 8.11* 5.13* 4.07*  CALCIUM 8.7* 8.8* 8.3*  PHOS  --  3.3 2.7   Liver Function Tests: Recent Labs  Lab 03/23/22 0612 03/25/22 0126 03/26/22 0421  AST 57*  --   --   ALT 17  --   --   ALKPHOS 122  --   --   BILITOT 0.9  --   --   PROT 8.7*  --   --   ALBUMIN 1.9* 1.9* 1.8*   CBC: Recent Labs  Lab 03/23/22 0612 03/24/22 0520 03/25/22 0126 03/26/22 0421  WBC 5.2 5.3 5.0 4.8  NEUTROABS 3.1  --   --   --   HGB 7.7* 7.1* 7.2* 7.0*  HCT 24.1* 22.5* 22.0* 21.3*  MCV 105.7* 106.6* 105.3* 103.9*  PLT 123* 109* 107* 113*   Iron Studies:  Recent Labs    03/25/22 0126  IRON 91  TIBC 228*  FERRITIN 1,005*   Lab Results  Component Value Date   INR 1.2 02/14/2022   INR  1.1 12/10/2021   INR 1.1 12/09/2021   Studies/Results: No results found.  Medications:   sodium chloride   Intravenous Once   albuterol  2.5 mg Nebulization Q6H   amLODipine  5 mg Oral Daily   atorvastatin  40 mg Oral Daily   Chlorhexidine Gluconate Cloth  6 each Topical Q0600   Chlorhexidine Gluconate Cloth  6 each Topical Q0600   Chlorhexidine Gluconate Cloth  6 each Topical Q0600   gabapentin  300 mg Oral TID   heparin  5,000 Units Subcutaneous Q8H   pantoprazole  40 mg Oral BID AC   sodium chloride flush  3 mL Intravenous Q12H    Dialysis Orders: [place] MWF 4h  400/1.5   98.8kg  2/2 bath  AVF  15ga  Hep none - mircera 225 q2, last 11/17, due 12/01 - hectorol 4 ug IV tiw    Assessment/ Plan: Shortness of breath/vol overload - due to high fluid gains and missed/shortened dialysis. May also have a viral URI with reported nasal congestion. Reminded of  importance of fluid restriction. Net UF 4.5L on 11/27, 2.7L on 11/28 and net UF 3.5L again today.  Continue UF as tolerated.  ESRD - MWF schedule, missed/shortened HD this week. Reinforced importance of HD compliance. As above.  HTN: Blood pressure improving with HD.  Anemia: Hgb chronically low with recurrent GI bleeds. On max dose ESA, next dose due 12/01. 1 unit pRBC ordered to be given with HD today.  Metabolic bone disease: CCa high (low albumin), holding VDRA for now. Does not take binders outpatient   Nutrition:  Will need renal diet with fluid restrictions. Pt informed he also needs to limit ice. T2DM: management per primary team   Jen Mow, PA-C [facility] 03/26/2022,11:30 AM  LOS: 2 days

## 2022-03-26 NOTE — Progress Notes (Signed)
[facility] PROGRESS NOTE    Progress Note  Daniel [name]  AVW:[id number] DOB: Sep 19, 1960 DOA: 03/23/2022 PCP: [name], NP     Brief Narrative:   Daniel [name] is an 61 y.o. 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  Assessment/Plan:   Acute respiratory failure with hypoxia secondary to hypervolemia associated with renal insufficiency/ESRD (end stage renal disease) on dialysis : Nephrology was consulted, they do to high volume gains question missed dialysis for short dialysis. Had HD on 03/24/2022, 11/28 and then again 03/26/2022 Nephrology discussed with the patient the importance of compliance with his HD. Still requiring 3 L of oxygen keep saturations greater 96%. Try to wean to room air out of bed to chair.  Hypervolemic hyponatremia: Improving with dialysis.  History of recurrent GI bleed secondary to AVM/varices/history of hepatocellular carcinoma/alcoholic cirrhosis: His followed by Dr. Annamaria Boots, underwent transarterial embolization of the right superior hepatic artery procedure on 12/29/2021 by interventional radiology no plan for systemic therapy at this point. Hemoglobin has been drifting down. Will transfuse 1 unit of packed red blood cells. Continue Protonix twice a day.  Diabetes mellitus type 2 controlled: With an A1c of 5.2 continue diet.  Hyperlipidemia:  continue statins.  Essential hypertension: Blood pressure is stable  ranging 150/80 continue Norvasc. It will continue to improve as he continues dialyzes regularly. He has received 3 dialysis treatments in-house.  Tobacco use: Continue nicotine patch.   Stage II sacral decubitus ulcer: RN Pressure Injury Documentation: Pressure Injury 07/04/21 Buttocks Medial Stage 2 -  Partial thickness loss of dermis presenting as a shallow open injury with a red, pink wound bed without slough. two small areas of broken skin in between buttocks. (Active)  07/04/21 0936  Location: Buttocks  Location Orientation: Medial  Staging: Stage 2 -  Partial thickness loss of dermis presenting as a shallow open injury with a red, pink wound bed without slough.  Wound Description (Comments): two small areas of broken skin in between buttocks.  Present on Admission:     Estimated body mass index is 30.93 kg/m as calculated from the following:   Height as of this encounter: '5\' 11"'$  (1.803 m).   Weight as of this encounter: 100.6 kg.   DVT prophylaxis: lovenox Family Communication:noen Status is: Inpatient Remains inpatient appropriate because: Volume overload due to missed dialysis    Code Status:     Code Status Orders  (From admission, onward)           Start     Ordered   03/23/22 1704  Full code  Continuous        03/23/22 1703           Code Status History     Date Active Date Inactive Code Status Order ID Comments User Context   02/15/2022 0233 02/21/2022 2058 Full Code [id number]  Kayleen Memos, DO ED   01/02/2022 0539 01/07/2022 1639 Full Code [id number]  Vernelle Emerald, MD ED   12/25/2021 0816 12/27/2021 1827 Full Code [id number]  [place],  Ralene Cork, MD ED   12/25/2021 0538 12/25/2021 0816 Full Code [id number]  Howerter, Ethelda Chick, DO ED   12/09/2021 0846 12/11/2021 2259 Full Code [id number]  Samella Parr, NP ED   11/13/2021 0208 11/15/2021 1514 Full Code [id number]  Vernelle Emerald, MD ED   10/05/2021 1230 10/10/2021 1632 Full Code [id number]  Mercy Riding,  MD ED   09/28/2021 1107 09/29/2021 2057 Full Code [id number]  Karmen Bongo, MD ED   09/14/2021 1256 09/18/2021 1838 Full Code [id number]  Lequita Halt, MD ED   08/23/2021 0821 08/27/2021 2346 Full Code [id number]  Norval Morton, MD ED   08/16/2021 2249 08/19/2021 1930 Full Code [id number]  Marcelyn Bruins, MD ED   08/05/2021 1321 08/10/2021 1806 Full Code [id number]  Lequita Halt, MD ED   07/03/2021 2110 07/10/2021 2239 Full Code [id number]  Howerter, Ethelda Chick, DO ED   06/13/2021 2009 06/21/2021 2257 Full Code [id number]  Lenore Cordia, MD ED   04/14/2021 1109 04/25/2021 2052 Full Code [id number]  Karmen Bongo, MD ED   04/08/2021 1357 04/09/2021 2149 Full Code [id number]  Dagoberto Ligas, PA-C Inpatient   04/08/2021 1059 04/08/2021 1357 Full Code [id number]  Waynetta Sandy, MD Inpatient   03/24/2021 1428 03/27/2021 1903 Full Code [id number]  Orma Flaming, MD ED   02/23/2021 1315 02/28/2021 2055 Full Code [id number]  Orma Flaming, MD ED   02/07/2021 1638 02/09/2021 1834 Full Code [id number]  Jonetta Osgood, MD ED   01/10/2021 1931 01/13/2021 2035 Full Code [id number]  Orma Flaming, MD ED   10/27/2020 2341 10/29/2020 0222 Full Code [id number]  Shalhoub, Sherryll Burger, MD ED   08/10/2020 0406 08/16/2020 2005 Full Code [id number]  Rise Patience, MD ED   07/31/2019 2033 08/09/2019 2019 Full Code [id number]  Jean Rosenthal, MD ED   07/23/2019 1838 07/25/2019 1628 Full Code [id number]  Seawell, Jaimie A, DO ED   07/06/2019 2147 07/07/2019 2140 Full Code [id number]  Ina Homes, MD ED   06/27/2019 1518 07/01/2019 1927 Full Code [id number]  Marty Heck, DO ED   03/29/2019 1121 03/31/2019 1716 Full Code [id number]  Katherine Roan, MD ED   01/22/2019 1842 01/24/2019 2155 Full Code [id number]  Kathrene Alu, MD ED   05/20/2017 2310 05/25/2017 1920 Full Code [id number]  Greta Doom, MD ED   06/29/2016 1113 07/01/2016 1523 Full Code [id number]  Benito Mccreedy, MD Inpatient   06/28/2013 0907 06/29/2013 0330  Full Code [id number]  Leota Jacobsen, MD ED         IV Access:   Peripheral IV   Procedures and diagnostic studies:   No results found.   Medical Consultants:   None.   Subjective:    Daniel [name] relates he still feels tired  Objective:    Vitals:   03/25/22 2306 03/26/22 0333 03/26/22 0824 03/26/22 0911  BP: (!) 166/108  (!) 150/80   Pulse: 98  93   Resp: 17  20   Temp: 98 F (36.7 C)  98.2 F (36.8 C)   TempSrc: Oral  Oral   SpO2: 100% 96% 99% 99%  Weight:      Height:       SpO2: 99 % O2 Flow Rate (L/min): 3 L/min   Intake/Output Summary (Last 24 hours) at 03/26/2022 0930 Last data filed at 03/26/2022 0451 Gross per 24 hour  Intake 1205 ml  Output 2725 ml  Net -1520 ml   [employer]  03/23/22 0559 03/25/22 0027 03/25/22 1555  Weight: 96 kg 100.6 kg 100.6 kg    Exam: General exam: In no acute distress. Respiratory system: Good air movement and clear to auscultation. Cardiovascular system: S1 & S2 heard, RRR.  Positive JVD Gastrointestinal system: Abdomen is nondistended, soft and nontender.  Extremities: 1+ edema Skin: No rashes, lesions or ulcers Psychiatry: Judgement and insight appear normal. Mood & affect appropriate.    Data Reviewed:    Labs: Basic Metabolic Panel: Recent Labs  Lab 03/23/22 0612 03/24/22 0520 03/25/22 0126 03/26/22 0421  NA 132* 133* 130* 127*  K 3.9 3.9 3.7 3.8  CL 94* 95* 89* 91*  CO2 '26 24 26 27  '$ GLUCOSE 123* 133* 102* 101*  BUN 29* 34* 20 15  CREATININE 6.84* 8.11* 5.13* 4.07*  CALCIUM 8.9 8.7* 8.8* 8.3*  PHOS  --   --  3.3 2.7   GFR Estimated Creatinine Clearance: 22.7 mL/min (A) (by C-G formula based on SCr of 4.07 mg/dL (H)). Liver Function Tests: Recent Labs  Lab 03/23/22 0612 03/25/22 0126 03/26/22 0421  AST 57*  --   --   ALT 17  --   --   ALKPHOS 122  --   --   BILITOT 0.9  --   --   PROT 8.7*  --   --   ALBUMIN 1.9* 1.9* 1.8*   No results for input(s): "LIPASE",  "AMYLASE" in the last 168 hours. No results for input(s): "AMMONIA" in the last 168 hours. Coagulation profile No results for input(s): "INR", "PROTIME" in the last 168 hours. COVID-19 Labs  Recent Labs    03/25/22 0126  FERRITIN 1,005*    Lab Results  Component Value Date   SARSCOV2NAA NEGATIVE 03/23/2022   [place] NEGATIVE 01/20/2022   SARSCOV2NAA NEGATIVE 12/20/2021   [place] NEGATIVE 11/27/2021    CBC: Recent Labs  Lab 03/23/22 0612 03/24/22 0520 03/25/22 0126 03/26/22 0421  WBC 5.2 5.3 5.0 4.8  NEUTROABS 3.1  --   --   --   HGB 7.7* 7.1* 7.2* 7.0*  HCT 24.1* 22.5* 22.0* 21.3*  MCV 105.7* 106.6* 105.3* 103.9*  PLT 123* 109* 107* 113*   Cardiac Enzymes: No results for input(s): "CKTOTAL", "CKMB", "CKMBINDEX", "TROPONINI" in the last 168 hours. BNP (last 3 results) No results for input(s): "PROBNP" in the last 8760 hours. CBG: No results for input(s): "GLUCAP" in the last 168 hours. D-Dimer: No results for input(s): "DDIMER" in the last 72 hours. Hgb A1c: No results for input(s): "HGBA1C" in the last 72 hours. Lipid Profile: No results for input(s): "CHOL", "HDL", "LDLCALC", "TRIG", "CHOLHDL", "LDLDIRECT" in the last 72 hours. Thyroid function studies: No results for input(s): "TSH", "T4TOTAL", "T3FREE", "THYROIDAB" in the last 72 hours.  Invalid input(s): "FREET3" Anemia work up: Recent Labs    03/25/22 0126  VITAMINB12 509  FOLATE 10.2  FERRITIN 1,005*  TIBC 228*  IRON 91  RETICCTPCT 6.5*   Sepsis Labs: Recent Labs  Lab 03/23/22 0612 03/24/22 0520 03/25/22 0126 03/26/22 0421  WBC 5.2 5.3 5.0 4.8   Microbiology Recent Results (from the past 240 hour(s))  Resp Panel by RT-PCR (Flu A&B, Covid) Anterior Nasal Swab     Status: None   Collection Time: 03/23/22  1:20 PM   Specimen: Anterior Nasal Swab  Result Value Ref Range Status   SARS Coronavirus 2 by RT PCR NEGATIVE NEGATIVE Final    Comment: (NOTE) SARS-CoV-2 target nucleic  acids are NOT DETECTED.  The SARS-CoV-2 RNA is  generally detectable in upper respiratory specimens during the acute phase of infection. The lowest concentration of SARS-CoV-2 viral copies this assay can detect is 138 copies/mL. A negative result does not preclude SARS-Cov-2 infection and should not be used as the sole basis for treatment or other patient management decisions. A negative result may occur with  improper specimen collection/handling, submission of specimen other than nasopharyngeal swab, presence of viral mutation(s) within the areas targeted by this assay, and inadequate number of viral copies(<138 copies/mL). A negative result must be combined with clinical observations, patient history, and epidemiological information. The expected result is Negative.  Fact Sheet for Patients:  EntrepreneurPulse.com.au  Fact Sheet for Healthcare Providers:  IncredibleEmployment.be  This test is no t yet approved or cleared by the [place] FDA and  has been authorized for detection and/or diagnosis of SARS-CoV-2 by FDA under an Emergency Use Authorization (EUA). This EUA will remain  in effect (meaning this test can be used) for the duration of the COVID-19 declaration under Section 564(b)(1) of the Act, 21 U.S.C.section 360bbb-3(b)(1), unless the authorization is terminated  or revoked sooner.       Influenza A by PCR NEGATIVE NEGATIVE Final   Influenza B by PCR NEGATIVE NEGATIVE Final    Comment: (NOTE) The Xpert Xpress SARS-CoV-2/FLU/RSV plus assay is intended as an aid in the diagnosis of influenza from Nasopharyngeal swab specimens and should not be used as a sole basis for treatment. Nasal washings and aspirates are unacceptable for Xpert Xpress SARS-CoV-2/FLU/RSV testing.  Fact Sheet for Patients: EntrepreneurPulse.com.au  Fact Sheet for Healthcare Providers: IncredibleEmployment.be  This  test is not yet approved or cleared by the [place] FDA and has been authorized for detection and/or diagnosis of SARS-CoV-2 by FDA under an Emergency Use Authorization (EUA). This EUA will remain in effect (meaning this test can be used) for the duration of the COVID-19 declaration under Section 564(b)(1) of the Act, 21 U.S.C. section 360bbb-3(b)(1), unless the authorization is terminated or revoked.  Performed at [facility], [place] [address] [place], [place] [address]      Medications:    albuterol  2.5 mg Nebulization Q6H   amLODipine  5 mg Oral Daily   atorvastatin  40 mg Oral Daily   Chlorhexidine Gluconate Cloth  6 each Topical Q0600   Chlorhexidine Gluconate Cloth  6 each Topical Q0600   Chlorhexidine Gluconate Cloth  6 each Topical Q0600   gabapentin  300 mg Oral TID   heparin  5,000 Units Subcutaneous Q8H   pantoprazole  40 mg Oral BID AC   sodium chloride flush  3 mL Intravenous Q12H   Continuous Infusions:    LOS: 2 days   Charlynne Cousins  [facility]  03/26/2022, 9:30 AM

## 2022-03-27 ENCOUNTER — Encounter: Payer: Self-pay | Admitting: *Deleted

## 2022-03-27 DIAGNOSIS — C220 Liver cell carcinoma: Secondary | ICD-10-CM | POA: Diagnosis not present

## 2022-03-27 DIAGNOSIS — E8770 Fluid overload, unspecified: Secondary | ICD-10-CM | POA: Diagnosis not present

## 2022-03-27 DIAGNOSIS — N186 End stage renal disease: Secondary | ICD-10-CM | POA: Diagnosis not present

## 2022-03-27 DIAGNOSIS — J9601 Acute respiratory failure with hypoxia: Secondary | ICD-10-CM | POA: Diagnosis not present

## 2022-03-27 LAB — TYPE AND SCREEN
ABO/RH(D): B POS
Antibody Screen: NEGATIVE
Unit division: 0

## 2022-03-27 LAB — BPAM RBC
Blood Product Expiration Date: 202312232359
ISSUE DATE / TIME: 202311291533
Unit Type and Rh: 7300

## 2022-03-27 LAB — CBC
HCT: 23.7 % — ABNORMAL LOW (ref 39.0–52.0)
Hemoglobin: 7.9 g/dL — ABNORMAL LOW (ref 13.0–17.0)
MCH: 34.2 pg — ABNORMAL HIGH (ref 26.0–34.0)
MCHC: 33.3 g/dL (ref 30.0–36.0)
MCV: 102.6 fL — ABNORMAL HIGH (ref 80.0–100.0)
Platelets: 113 10*3/uL — ABNORMAL LOW (ref 150–400)
RBC: 2.31 MIL/uL — ABNORMAL LOW (ref 4.22–5.81)
RDW: 19.7 % — ABNORMAL HIGH (ref 11.5–15.5)
WBC: 4.9 10*3/uL (ref 4.0–10.5)
nRBC: 0 % (ref 0.0–0.2)

## 2022-03-27 MED ORDER — CHLORHEXIDINE GLUCONATE CLOTH 2 % EX PADS
6.0000 | MEDICATED_PAD | Freq: Every day | CUTANEOUS | Status: DC
  Administered 2022-03-28 – 2022-03-29 (×2): 6 via TOPICAL

## 2022-03-27 NOTE — Progress Notes (Signed)
[facility] PROGRESS NOTE    Progress Note  Daniel [name]  VZD:[id number] DOB: Jul 05, 1960 DOA: 03/23/2022 PCP: Daniel Perna, NP     Brief Narrative:   Daniel [name] is an 61 y.o. 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  Assessment/Plan:   Acute respiratory failure with hypoxia secondary to hypervolemia associated with renal insufficiency/ESRD (end stage renal disease) on dialysis : Nephrology was consulted, they do to high volume gains question missed dialysis for short dialysis. Had HD on 03/24/2022, 11/28 and then again 03/26/2022. They have dialyzed and he is negative about 7 L.  Still requiring 2 L of oxygen to keep saturations greater 92%. Try to wean to room air. Nephrology discussed with the patient the importance of compliance with his HD. Physical therapy evaluated the patient, will need home health PT.  Hypervolemic hyponatremia: Improving with dialysis.  History of recurrent GI bleed secondary to AVM/varices/history of hepatocellular carcinoma/alcoholic cirrhosis: His followed by Dr. Annamaria [name], underwent transarterial embolization of the right superior hepatic artery procedure on 12/29/2021 by interventional radiology no plan for systemic therapy at this point. Status post 1 unit of packed red blood cells continue Protonix twice a day. Hemoglobin 7.9.  Diabetes mellitus type 2 controlled: With an A1c of 5.2 continue  diet.  Hyperlipidemia:  continue statins.  Essential hypertension: There is now 119/97 continue Norvasc. BP is improved with HD.  Tobacco use: Continue nicotine patch.   Stage II sacral decubitus ulcer: RN Pressure Injury Documentation: Pressure Injury 07/04/21 Buttocks Medial Stage 2 -  Partial thickness loss of dermis presenting as a shallow open injury with a red, pink wound bed without slough. two small areas of broken skin in between buttocks. (Active)  07/04/21 0936  Location: Buttocks  Location Orientation: Medial  Staging: Stage 2 -  Partial thickness loss of dermis presenting as a shallow open injury with a red, pink wound bed without slough.  Wound Description (Comments): two small areas of broken skin in between buttocks.  Present on Admission:     Estimated body mass index is 29.33 kg/m as calculated from the following:   Height as of this encounter: '5\' 11"'$  (1.803 m).   Weight as of this encounter: 95.4 kg.   DVT prophylaxis: lovenox Family Communication:noen Status is: Inpatient Remains inpatient appropriate because: Volume overload due to missed dialysis    Code Status:     Code Status Orders  (From admission, onward)           Start     Ordered   03/23/22 1704  Full code  Continuous        03/23/22 1703           Code Status History     Date Active Date Inactive Code Status Order ID Comments User Context   02/15/2022 0233 02/21/2022 2058 Full Code [id number]  Kayleen Memos, DO ED   01/02/2022 0539 01/07/2022 1639 Full Code [id number]  Vernelle Emerald, MD ED   12/25/2021 0816 12/27/2021 1827 Full Code [id number]  [place],  Ralene Cork, MD ED   12/25/2021 0538 12/25/2021 0816 Full Code [id number]  Howerter, Ethelda Chick, DO ED   12/09/2021 0846 12/11/2021 2259 Full Code [id number]  Samella Parr, NP ED   11/13/2021 0208 11/15/2021 1514 Full Code [id number]  Vernelle Emerald, MD ED   10/05/2021 1230 10/10/2021 1632 Full Code [id number]  Mercy Riding, MD ED    09/28/2021 1107 09/29/2021 2057 Full Code [id number]  Karmen Bongo, MD ED   09/14/2021 1256 09/18/2021 1838 Full Code [id number]  Lequita Halt, MD ED   08/23/2021 0821 08/27/2021 2346 Full Code [id number]  Norval Morton, MD ED   08/16/2021 2249 08/19/2021 1930 Full Code [id number]  Marcelyn Bruins, MD ED   08/05/2021 1321 08/10/2021 1806 Full Code [id number]  Lequita Halt, MD ED   07/03/2021 2110 07/10/2021 2239 Full Code [id number]  Howerter, Ethelda Chick, DO ED   06/13/2021 2009 06/21/2021 2257 Full Code [id number]  Lenore Cordia, MD ED   04/14/2021 1109 04/25/2021 2052 Full Code [id number]  Karmen Bongo, MD ED   04/08/2021 1357 04/09/2021 2149 Full Code [id number]  Dagoberto Ligas, PA-C Inpatient   04/08/2021 1059 04/08/2021 1357 Full Code [id number]  Waynetta Sandy, MD Inpatient   03/24/2021 1428 03/27/2021 1903 Full Code [id number]  Orma Flaming, MD ED   02/23/2021 1315 02/28/2021 2055 Full Code [id number]  Orma Flaming, MD ED   02/07/2021 1638 02/09/2021 1834 Full Code [id number]  Jonetta Osgood, MD ED   01/10/2021 1931 01/13/2021 2035 Full Code [id number]  Orma Flaming, MD ED   10/27/2020 2341 10/29/2020 0222 Full Code [id number]  Shalhoub, Sherryll Burger, MD ED   08/10/2020 0406 08/16/2020 2005 Full Code [id number]  Rise Patience, MD ED   07/31/2019 2033 08/09/2019 2019 Full Code [id number]  Jean Rosenthal, MD ED   07/23/2019 1838 07/25/2019 1628 Full Code [id number]  Seawell, Jaimie A, DO ED   07/06/2019 2147 07/07/2019 2140 Full Code [id number]  Ina Homes, MD ED   06/27/2019 1518 07/01/2019 1927 Full Code [id number]  Marty Heck, DO ED   03/29/2019 1121 03/31/2019 1716 Full Code [id number]  Katherine Roan, MD ED   01/22/2019 1842 01/24/2019 2155 Full Code [id number]  Kathrene Alu, MD ED   05/20/2017 2310 05/25/2017 1920 Full Code [id number]  Greta Doom, MD ED   06/29/2016 1113 07/01/2016 1523 Full Code [id number]  Benito Mccreedy, MD Inpatient   06/28/2013 0907 06/29/2013 0330 Full Code  [id number]  Leota Jacobsen, MD ED         IV Access:   Peripheral IV   Procedures and diagnostic studies:   No results found.   Medical Consultants:   None.   Subjective:    Daniel [name] relates his breathing is a lot better than yesterday.  Objective:    Vitals:   03/26/22 1820 03/26/22 1949 03/27/22 0621 03/27/22 0756  BP: (!) 153/87 (!) 142/72 138/83 (!) 119/97  Pulse: 93 93 91 94  Resp: '18 17 17 16  '$ Temp: 98.1 F (36.7 C) 98.4 F (36.9 C) 98 F (36.7 C) (!) 97.4 F (36.3 C)  TempSrc: Oral Oral Oral Oral  SpO2: 93% 93% 99% 100%  Weight:      Height:       SpO2: 100 % O2 Flow Rate (L/min): 2 L/min   Intake/Output Summary (Last 24 hours) at 03/27/2022 9758 Last data filed at 03/26/2022 1820 Gross per 24 hour  Intake 420 ml  Output  3500 ml  Net -3080 ml    Filed Weights   03/25/22 1555 03/26/22 1044 03/26/22 1330  Weight: 100.6 kg 98.9 kg 95.4 kg    Exam: General exam: In no acute distress. Respiratory system: Good air movement and clear to auscultation. Cardiovascular system: S1 & S2 heard, RRR. No JVD. Gastrointestinal system: Abdomen is nondistended, soft and nontender.  Extremities: No pedal edema. Skin: No rashes, lesions or ulcers Psychiatry: Judgement and insight appear normal. Mood & affect appropriate.   Data Reviewed:    Labs: Basic Metabolic Panel: Recent Labs  Lab 03/23/22 0612 03/24/22 0520 03/25/22 0126 03/26/22 0421  NA 132* 133* 130* 127*  K 3.9 3.9 3.7 3.8  CL 94* 95* 89* 91*  CO2 '26 24 26 27  '$ GLUCOSE 123* 133* 102* 101*  BUN 29* 34* 20 15  CREATININE 6.84* 8.11* 5.13* 4.07*  CALCIUM 8.9 8.7* 8.8* 8.3*  PHOS  --   --  3.3 2.7    GFR Estimated Creatinine Clearance: 22.2 mL/min (A) (by C-G formula based on SCr of 4.07 mg/dL (H)). Liver Function Tests: Recent Labs  Lab 03/23/22 0612 03/25/22 0126 03/26/22 0421  AST 57*  --   --   ALT 17  --   --   ALKPHOS 122  --   --   BILITOT 0.9  --   --    PROT 8.7*  --   --   ALBUMIN 1.9* 1.9* 1.8*    No results for input(s): "LIPASE", "AMYLASE" in the last 168 hours. No results for input(s): "AMMONIA" in the last 168 hours. Coagulation profile No results for input(s): "INR", "PROTIME" in the last 168 hours. COVID-19 Labs  Recent Labs    03/25/22 0126  FERRITIN 1,005*     Lab Results  Component Value Date   SARSCOV2NAA NEGATIVE 03/23/2022   [place] NEGATIVE 01/20/2022   SARSCOV2NAA NEGATIVE 12/20/2021   [place] NEGATIVE 11/27/2021    CBC: Recent Labs  Lab 03/23/22 0612 03/24/22 0520 03/25/22 0126 03/26/22 0421 03/27/22 0147  WBC 5.2 5.3 5.0 4.8 4.9  NEUTROABS 3.1  --   --   --   --   HGB 7.7* 7.1* 7.2* 7.0* 7.9*  HCT 24.1* 22.5* 22.0* 21.3* 23.7*  MCV 105.7* 106.6* 105.3* 103.9* 102.6*  PLT 123* 109* 107* 113* 113*    Cardiac Enzymes: No results for input(s): "CKTOTAL", "CKMB", "CKMBINDEX", "TROPONINI" in the last 168 hours. BNP (last 3 results) No results for input(s): "PROBNP" in the last 8760 hours. CBG: No results for input(s): "GLUCAP" in the last 168 hours. D-Dimer: No results for input(s): "DDIMER" in the last 72 hours. Hgb A1c: No results for input(s): "HGBA1C" in the last 72 hours. Lipid Profile: No results for input(s): "CHOL", "HDL", "LDLCALC", "TRIG", "CHOLHDL", "LDLDIRECT" in the last 72 hours. Thyroid function studies: No results for input(s): "TSH", "T4TOTAL", "T3FREE", "THYROIDAB" in the last 72 hours.  Invalid input(s): "FREET3" Anemia work up: Recent Labs    03/25/22 0126  VITAMINB12 509  FOLATE 10.2  FERRITIN 1,005*  TIBC 228*  IRON 91  RETICCTPCT 6.5*    Sepsis Labs: Recent Labs  Lab 03/24/22 0520 03/25/22 0126 03/26/22 0421 03/27/22 0147  WBC 5.3 5.0 4.8 4.9    Microbiology Recent Results (from the past 240 hour(s))  Resp Panel by RT-PCR (Flu A&B, Covid) Anterior Nasal Swab     Status: None   Collection Time: 03/23/22  1:20 PM   Specimen: Anterior Nasal  Swab  Result Value Ref Range Status  SARS Coronavirus 2 by RT PCR NEGATIVE NEGATIVE Final    Comment: (NOTE) SARS-CoV-2 target nucleic acids are NOT DETECTED.  The SARS-CoV-2 RNA is generally detectable in upper respiratory specimens during the acute phase of infection. The lowest concentration of SARS-CoV-2 viral copies this assay can detect is 138 copies/mL. A negative result does not preclude SARS-Cov-2 infection and should not be used as the sole basis for treatment or other patient management decisions. A negative result may occur with  improper specimen collection/handling, submission of specimen other than nasopharyngeal swab, presence of viral mutation(s) within the areas targeted by this assay, and inadequate number of viral copies(<138 copies/mL). A negative result must be combined with clinical observations, patient history, and epidemiological information. The expected result is Negative.  Fact Sheet for Patients:  EntrepreneurPulse.com.au  Fact Sheet for Healthcare Providers:  IncredibleEmployment.be  This test is no t yet approved or cleared by the [place] FDA and  has been authorized for detection and/or diagnosis of SARS-CoV-2 by FDA under an Emergency Use Authorization (EUA). This EUA will remain  in effect (meaning this test can be used) for the duration of the COVID-19 declaration under Section 564(b)(1) of the Act, 21 U.S.C.section 360bbb-3(b)(1), unless the authorization is terminated  or revoked sooner.       Influenza A by PCR NEGATIVE NEGATIVE Final   Influenza B by PCR NEGATIVE NEGATIVE Final    Comment: (NOTE) The Xpert Xpress SARS-CoV-2/FLU/RSV plus assay is intended as an aid in the diagnosis of influenza from Nasopharyngeal swab specimens and should not be used as a sole basis for treatment. Nasal washings and aspirates are unacceptable for Xpert Xpress SARS-CoV-2/FLU/RSV testing.  Fact Sheet for  Patients: EntrepreneurPulse.com.au  Fact Sheet for Healthcare Providers: IncredibleEmployment.be  This test is not yet approved or cleared by the [place] FDA and has been authorized for detection and/or diagnosis of SARS-CoV-2 by FDA under an Emergency Use Authorization (EUA). This EUA will remain in effect (meaning this test can be used) for the duration of the COVID-19 declaration under Section 564(b)(1) of the Act, 21 U.S.C. section 360bbb-3(b)(1), unless the authorization is terminated or revoked.  Performed at [facility], [place] [address], [place], [place] [address]      Medications:    sodium chloride   Intravenous Once   albuterol  2.5 mg Nebulization Q6H   amLODipine  5 mg Oral Daily   atorvastatin  40 mg Oral Daily   Chlorhexidine Gluconate Cloth  6 each Topical Q0600   Chlorhexidine Gluconate Cloth  6 each Topical Q0600   Chlorhexidine Gluconate Cloth  6 each Topical Q0600   gabapentin  300 mg Oral TID   heparin  5,000 Units Subcutaneous Q8H   pantoprazole  40 mg Oral BID AC   sodium chloride flush  3 mL Intravenous Q12H   Continuous Infusions:    LOS: 3 days   Charlynne Cousins  [facility]  03/27/2022, 9:22 AM

## 2022-03-27 NOTE — Progress Notes (Signed)
   03/27/22 0600  Hygiene  CHG  Bath Completed  (no, pt refused.)

## 2022-03-27 NOTE — Progress Notes (Addendum)
[facility] Progress Note   Subjective:   Patient seen and examined at bedside.  Reports improvement in breathing.  On room air when entering the room and until I told him his [place] was on his head.  Denies CP, abdominal pain and n/v/d.  Reports dialysis went well yesterday. States he does not understand why he has to be restricted. Discussed again importance of fluid restrictions, including ice, and reasoning behind it.  Objective Vitals:   03/26/22 1949 03/27/22 0621 03/27/22 0756 03/27/22 0924  BP: (!) 142/72 138/83 (!) 119/97   Pulse: 93 91 94   Resp: '17 17 16 18  '$ Temp: 98.4 F (36.9 C) 98 F (36.7 C) (!) 97.4 F (36.3 C)   TempSrc: Oral Oral Oral   SpO2: 93% 99% 100%   Weight:      Height:       Physical Exam General:chronically ill appearing male in NAD Heart:RRR, no mrg Lungs: mostly CTAB, BS decreased in LLL, nml WOB on RA Abdomen:soft, NTND Extremities:trace LE edema Dialysis Access: LU AVF +b/t   Filed Weights   03/25/22 1555 03/26/22 1044 03/26/22 1330  Weight: 100.6 kg 98.9 kg 95.4 kg    Intake/Output Summary (Last 24 hours) at 03/27/2022 1143 Last data filed at 03/26/2022 1820 Gross per 24 hour  Intake 420 ml  Output 3500 ml  Net -3080 ml    Additional Objective Labs: Basic Metabolic Panel: Recent Labs  Lab 03/24/22 0520 03/25/22 0126 03/26/22 0421  NA 133* 130* 127*  K 3.9 3.7 3.8  CL 95* 89* 91*  CO2 '24 26 27  '$ GLUCOSE 133* 102* 101*  BUN 34* 20 15  CREATININE 8.11* 5.13* 4.07*  CALCIUM 8.7* 8.8* 8.3*  PHOS  --  3.3 2.7   Liver Function Tests: Recent Labs  Lab 03/23/22 0612 03/25/22 0126 03/26/22 0421  AST 57*  --   --   ALT 17  --   --   ALKPHOS 122  --   --   BILITOT 0.9  --   --   PROT 8.7*  --   --   ALBUMIN 1.9* 1.9* 1.8*   CBC: Recent Labs  Lab 03/23/22 0612 03/24/22 0520 03/25/22 0126 03/26/22 0421 03/27/22 0147  WBC 5.2 5.3 5.0 4.8 4.9  NEUTROABS 3.1  --   --   --   --   HGB 7.7* 7.1* 7.2* 7.0* 7.9*   HCT 24.1* 22.5* 22.0* 21.3* 23.7*  MCV 105.7* 106.6* 105.3* 103.9* 102.6*  PLT 123* 109* 107* 113* 113*   Blood Culture    Component Value Date/Time   SDES PERITONEAL 02/16/2022 1252   SDES PERITONEAL 02/16/2022 1252   SPECREQUEST NONE 02/16/2022 1252   SPECREQUEST NONE 02/16/2022 1252   CULT  02/16/2022 1252    NO GROWTH 5 DAYS Performed at [facility], [place] [address], [place], [place] [address]    REPTSTATUS 02/21/2022 FINAL 02/16/2022 1252   REPTSTATUS 02/16/2022 FINAL 02/16/2022 1252   Iron Studies:  Recent Labs    03/25/22 0126  IRON 91  TIBC 228*  FERRITIN 1,005*   Lab Results  Component Value Date   INR 1.2 02/14/2022   INR 1.1 12/10/2021   INR 1.1 12/09/2021   Medications:   sodium chloride   Intravenous Once   albuterol  2.5 mg Nebulization Q6H   amLODipine  5 mg Oral Daily   atorvastatin  40 mg Oral Daily   Chlorhexidine Gluconate Cloth  6 each Topical Q0600  Chlorhexidine Gluconate Cloth  6 each Topical Q0600   Chlorhexidine Gluconate Cloth  6 each Topical Q0600   gabapentin  300 mg Oral TID   heparin  5,000 Units Subcutaneous Q8H   pantoprazole  40 mg Oral BID AC   sodium chloride flush  3 mL Intravenous Q12H    Dialysis Orders: [facility] MWF 4h  400/1.5   98.8kg  2/2 bath  AVF  15ga  Hep none - mircera 225 q2, last 11/17, due 12/01 - hectorol 4 ug IV tiw    Assessment/ Plan: Shortness of breath/vol overload - due to high fluid gains and missed/shortened dialysis. May also have a viral URI with reported nasal congestion.Continue to discuss the importance of fluid restriction. Net UF 4.5L on 11/27, 2.7L on 11/28 and net UF 3.5L 11/29.  Continue max UF as tolerated. Under dry weight if weights correct, will need lower dry on d/c.  ESRD - MWF schedule. Reinforced importance of HD compliance. HD tomorrow per regular schedule.   HTN: Blood pressure at goal. Continue home meds.   Anemia: Hgb chronically low with recurrent GI bleeds. On max dose  ESA, next dose due 12/01. Hgb 7.9 s/p 1 unit pRBC yesterday.   Metabolic bone disease: CCa high (low albumin), holding VDRA for now. Does not take binders outpatient - phos in goal.   Nutrition:  Renal diet with fluid restrictions. Pt informed he also needs to limit all fluids including ice.  T2DM: management per primary team  Jen Mow, PA-C [facility] 03/27/2022,11:43 AM  LOS: 3 days

## 2022-03-27 NOTE — Care Management Important Message (Signed)
Important Message  Patient Details  Name: Daniel [name] MRN: [id number] Date of Birth: Jul 31, 1960   Medicare Important Message Given:  Yes     Hannah Beat 03/27/2022, 12:57 PM

## 2022-03-27 NOTE — Evaluation (Signed)
Occupational Therapy Evaluation Patient Details Name: Daniel [name] MRN: [id number] DOB: Sep 21, 1960 Today's Date: 03/27/2022   History of Present Illness Pt is a 61 y/o M admitted on 03/23/22 after presenting to the ED with c/o progressive SOB & LE edema. Chest x-ray with bilateral pleural effusions, right greater than left with diffuse interstitial markings concerning for edema. Pt is being treated for respiratory failure secondary to volume overload in the setting of ESRD on HD. 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 Pt presents now with minor deficits in endurance, cognition, and cardiopulmonary tolerance. Pt requires Setup for UB ADL, min guard to Min A for LB ADLs and min guard for squat pivots (does not have prosthetic in room). Emphasis on current O2 requirements, fluid restriction and ways to prevent volume overload with further reinforcement needed. Pt agreeable for HHOT follow up at DC.  SpO2 92-94% on 2 L O2      Recommendations for follow up therapy are one component of a multi-disciplinary discharge planning process, led by the attending physician.  Recommendations may be updated based on patient status, additional functional criteria and insurance authorization.   Follow Up Recommendations  Home health OT     Assistance Recommended at Discharge Intermittent Supervision/Assistance  Patient can return home with the following A little help with bathing/dressing/bathroom;Direct supervision/assist for medications management;Direct supervision/assist for financial management    Functional Status Assessment  Patient has had a recent decline in their functional status and  demonstrates the ability to make significant improvements in function in a reasonable and predictable amount of time.  Equipment Recommendations  None recommended by OT    Recommendations for Other Services       Precautions / Restrictions Precautions Precautions: Fall Precaution Comments: L BKA w/o prosthetic in room Restrictions Weight Bearing Restrictions: No      Mobility Bed Mobility               General bed mobility comments: sitting EOB on entry    Transfers Overall transfer level: Needs assistance Equipment used: None Transfers: Bed to chair/wheelchair/BSC     Squat pivot transfers: Min guard       General transfer comment: stabilizing recliner with pt able to easily pivot over holding to armrests      Balance Overall balance assessment: Needs assistance Sitting-balance support: No upper extremity supported, Feet supported Sitting balance-Leahy Scale: Fair                                     ADL either performed or assessed with clinical judgement   ADL Overall ADL's : Needs assistance/impaired Eating/Feeding: Independent   Grooming: Set up;Sitting   Upper Body Bathing: Set up;Sitting   Lower Body Bathing: Minimal assistance;Sitting/lateral leans;Sit to/from stand   Upper Body Dressing : Set up;Sitting   Lower Body Dressing: Sit to/from stand;Sitting/lateral leans;Minimal assistance   Toilet Transfer: Min guard;Squat-pivot   Toileting- Clothing Manipulation and Hygiene: Minimal assistance;Sitting/lateral lean;Sit to/from stand         General ADL Comments: Pt fairly close to baseline, focus on endurance, O2 needs, fluid restrictions and mgmt of this at home.  Vision Ability to See in Adequate Light: 0 Adequate Patient Visual Report: No change from baseline Vision Assessment?: No apparent visual deficits     Perception     Praxis      Pertinent Vitals/Pain Pain Assessment Pain Assessment: No/denies pain      Hand Dominance Right   Extremity/Trunk Assessment Upper Extremity Assessment Upper Extremity Assessment: Overall WFL for tasks assessed;RUE deficits/detail RUE Deficits / Details: reports rotator cuff tear, PROM WFL, active to 80*   Lower Extremity Assessment Lower Extremity Assessment: Defer to PT evaluation   Cervical / Trunk Assessment Cervical / Trunk Assessment: Normal   Communication Communication Communication: No difficulties   Cognition Arousal/Alertness: Awake/alert Behavior During Therapy: WFL for tasks assessed/performed, Flat affect Overall Cognitive Status: No family/caregiver present to determine baseline cognitive functioning                                 General Comments: pleasant, follows directions though slower processing and groggy appearing. decreased insight into renal diet, HD, and fluid restrictions     General Comments       Exercises     Shoulder Instructions      Home Living Family/patient expects to be discharged to:: Private residence Living Arrangements: Alone Available Help at Discharge: Friend(s);Available PRN/intermittently Type of Home: House Home Access: Ramped entrance     Home Layout: Two level Alternate Level Stairs-Number of Steps: flight Alternate Level Stairs-Rails: Right Bathroom Shower/Tub: Walk-in shower;Sponge bathes at baseline   Bathroom Toilet: Standard     Home Equipment: Wheelchair - Publishing copy (2 wheels)          Prior Functioning/Environment Prior Level of Function : Independent/Modified Independent;Driving             Mobility Comments: uses RW and prosthetic LE vs wheelchair for mobility ADLs Comments: MOD I with ADLs, sponge bathes at baseline due inability to get into shower. still drives and manages all IADLs per pt        OT Problem List: Decreased strength;Decreased activity tolerance;Impaired balance (sitting and/or standing);Decreased cognition;Decreased  knowledge of precautions      OT Treatment/Interventions: Self-care/ADL training;Therapeutic exercise;Energy conservation;DME and/or AE instruction;Therapeutic activities;Patient/family education    OT Goals(Current goals can be found in the care plan section) Acute Rehab OT Goals Patient Stated Goal: home soon, get rid of O2, have more ice OT Goal Formulation: With patient Time For Goal Achievement: 04/10/22 Potential to Achieve Goals: Good  OT Frequency: Min 2X/week    Co-evaluation              AM-PAC OT "6 Clicks" Daily Activity     Outcome Measure Help from another person eating meals?: None Help from another person taking care of personal grooming?: A Little Help from another person toileting, which includes using toliet, bedpan, or urinal?: A Little Help from another person bathing (including washing, rinsing, drying)?: A Little Help from another person to put on and taking off regular upper body clothing?: A Little Help from another person to put on and taking off regular lower body clothing?: A Little 6 Click Score: 19   End of Session Equipment Utilized During Treatment: Oxygen Nurse Communication: Mobility status  Activity Tolerance: Patient tolerated treatment well Patient left: in chair;with call bell/phone within reach;with chair alarm set  OT Visit Diagnosis: Unsteadiness on feet (R26.81);Other abnormalities of gait and mobility (R26.89)  Time: 2840-6986 OT Time Calculation (min): 19 min Charges:  OT General Charges $OT Visit: 1 Visit OT Evaluation $OT Eval Moderate Complexity: 1 Mod  Malachy Chamber, OTR/L Acute Rehab Services Office: [phone]   Layla Maw 03/27/2022, 2:38 PM

## 2022-03-27 NOTE — TOC Initial Note (Signed)
Transition of Care (TOC) - Initial/Assessment Note   Patient from home alone.   Drives himself to dialysis and plans to drive himself home at discharge.   He has wheel chair and sliding board. Wheelchair is in his car in parking lot at [employer].   Patient active with [facility] for PT,OT,RN . Confirmed with Calvin with [facility]. He will need  orders and face to face  Patient Details  Name: Daniel [name] MRN: [id number] Date of Birth: [birth year]  Transition of Care [facility]) CM/SW Contact:    Marilu Favre, RN Phone Number: 03/27/2022, 2:26 PM  Clinical Narrative:                   Expected Discharge Plan: [place] Barriers to Discharge: Continued Medical Work up   Patient Goals and CMS Choice Patient states their goals for this hospitalization and ongoing recovery are:: to reurn to home CMS Medicare.gov Compare Post Acute Care list provided to:: Patient Choice offered to / list presented to : Patient  Expected Discharge Plan and Services Expected Discharge Plan: [place]   Discharge Planning Services: CM Consult Post Acute Care Choice: [place] arrangements for the past 2 months: Single Family Home                 DME Arranged: N/A         HH Arranged: PT, OT, RN   Date HH Agency Contacted: 03/27/22 Time HH Agency Contacted: [id number] Representative spoke with at [place]: Kerry Dory  Prior Living Arrangements/Services Living arrangements for the past 2 months: [place] with:: Self Patient language and need for interpreter reviewed:: Yes Do you feel safe going back to the place where you live?: Yes          Current home services: DME Criminal Activity/Legal Involvement Pertinent to Current Situation/Hospitalization: No - Comment as needed  Activities of Daily Living Home Assistive Devices/Equipment: Wheelchair, Prosthesis, Transfer board ADL Screening (condition at time of admission) Patient's  cognitive ability adequate to safely complete daily activities?: Yes Is the patient deaf or have difficulty hearing?: No Does the patient have difficulty seeing, even when wearing glasses/contacts?: No Does the patient have difficulty concentrating, remembering, or making decisions?: No Patient able to express need for assistance with ADLs?: Yes Does the patient have difficulty dressing or bathing?: Yes Independently performs ADLs?: Yes (appropriate for developmental age) Does the patient have difficulty walking or climbing stairs?: Yes Weakness of Legs: Both Weakness of Arms/Hands: Both  Permission Sought/Granted   Permission granted to share information with : No              Emotional Assessment Appearance:: Appears stated age Attitude/Demeanor/Rapport: Engaged Affect (typically observed): Accepting Orientation: : Oriented to Self, Oriented to Place, Oriented to  Time, Oriented to Situation Alcohol / Substance Use: Not Applicable Psych Involvement: No (comment)  Admission diagnosis:  Wheezing [R06.2] Chronic pulmonary edema [J81.1] Acute respiratory failure with hypoxia (HCC) [J96.01] Hypervolemia associated with renal insufficiency [E87.70, N28.9] Patient Active Problem List   Diagnosis Date Noted   Acute respiratory failure with hypoxia ([place]) 03/24/2022   Hypervolemia associated with renal insufficiency 03/23/2022   Decompensated HCV cirrhosis (HCC)    Other ascites    Cirrhosis of liver with ascites ([place])    Acute cardiogenic pulmonary edema ([place]) 01/02/2022   Bilateral pleural effusion 01/02/2022   Volume overload 12/25/2021   Acute on chronic anemia    ESRD on  dialysis ([place])    Hyponatremia 12/09/2021   Mixed hyperlipidemia 11/13/2021   Musculoskeletal neck pain 11/13/2021   Acute blood loss anemia 11/12/2021   Iron deficiency anemia due to chronic blood loss 10/17/2021   Euthyroid sick syndrome 10/10/2021   Elevated troponin 10/10/2021   LLQ abdominal pain  10/08/2021   Physical deconditioning 10/08/2021   Cognitive decline 10/08/2021   Left flank pain 10/07/2021   Intestinal polyps 10/06/2021   Portal hypertensive gastropathy ([place]) 10/06/2021   Rotator cuff syndrome 10/05/2021   Epistaxis 10/05/2021   Hx of BKA, left ([place]) 10/05/2021   Edema of amputation stump of left lower extremity ([place]) 09/28/2021   Chronic pain disorder 09/28/2021   Amputation stump infection ([place])    Sepsis ([place]) 08/16/2021   Right foot pain 08/07/2021   Right lower lobe lung mass 08/06/2021   Hepatocellular carcinoma ([place]) 08/06/2021   Pressure injury of skin 07/05/2021   Right leg pain 07/04/2021   Chronic GI bleeding 07/04/2021   Acute on chronic diastolic CHF (congestive heart failure) ([place]) 06/14/2021   GERD without esophagitis 06/14/2021   Chest pain 06/13/2021   Diabetic foot infection ([place]) 04/14/2021   PAD (peripheral artery disease) ([place]) 04/08/2021   Gastritis and gastroduodenitis    ESRD (end stage renal disease) on dialysis ([place]) 03/25/2021   Diabetes mellitus type 2, controlled, with complications ([place]) 47/82/9562   Alcohol abuse with intoxication ([place]) 03/25/2021   Cocaine abuse ([place]) 03/25/2021   Tobacco use disorder 03/25/2021   Class 2 obesity due to excess calories with body mass index (BMI) of 39.0 to 39.9 in adult 03/25/2021   AVM (arteriovenous malformation) of duodenum, acquired    Peptic ulcer disease    Upper GI bleed 03/24/2021   Chalazion of right upper eyelid 03/24/2021   Thrombocytopenia ([place]) 03/24/2021   Visual hallucination 03/24/2021   Bilateral leg edema 03/24/2021   Benign neoplasm of duodenum, jejunum, and ileum    Anemia due to chronic kidney disease 02/23/2021   Rotator cuff tear arthropathy of right shoulder 01/23/2021   Alcohol dependence ([place]) 01/13/2021   Gastric ulcer with hemorrhage 01/13/2021   Generalized weakness    Transaminitis 01/10/2021   Alcohol abuse 10/27/2020   History of cocaine abuse ([place])  10/27/2020   Acute upper GI bleed 08/10/2020   Duodenal ulcer with hemorrhage    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   AVM (arteriovenous malformation) of small bowel, acquired    Anemia due to GI blood loss 07/31/2019   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) ([place]) 07/11/2019   Iron deficiency anemia 03/30/2019   Alcohol use disorder 03/30/2019   B12 deficiency 03/30/2019   Orthostatic hypotension 01/22/2019   Essential hypertension 06/29/2016   PCP:  [name], NP Pharmacy:   [facility], [address] [place] [address] Phone: [phone] Fax: [phone]     Social Determinants of Health (SDOH) Interventions Housing Interventions: Inpatient TOC  Readmission Risk Interventions    10/10/2021   10:49 AM 02/27/2021   12:51 PM 07/07/2019    4:03 PM  Readmission Risk Prevention Plan  Transportation Screening Complete Complete Complete  PCP or Specialist Appt within 3-5 Days   Complete  HRI or [place]   Complete  Social Work Consult for [place] Planning/Counseling   Complete  Palliative Care Screening   Not Applicable  Medication Review Press photographer) Complete Complete Complete  PCP or  Specialist appointment within 3-5 days of discharge Complete Complete   HRI or [place] Complete Complete   SW Recovery Care/Counseling Consult Complete Complete   Palliative Care Screening Complete Not [place] Patient Refused Not Applicable

## 2022-03-28 DIAGNOSIS — C220 Liver cell carcinoma: Secondary | ICD-10-CM | POA: Diagnosis not present

## 2022-03-28 DIAGNOSIS — J9601 Acute respiratory failure with hypoxia: Secondary | ICD-10-CM | POA: Diagnosis not present

## 2022-03-28 DIAGNOSIS — E8770 Fluid overload, unspecified: Secondary | ICD-10-CM | POA: Diagnosis not present

## 2022-03-28 DIAGNOSIS — N186 End stage renal disease: Secondary | ICD-10-CM | POA: Diagnosis not present

## 2022-03-28 LAB — CBC
HCT: 23.8 % — ABNORMAL LOW (ref 39.0–52.0)
Hemoglobin: 7.8 g/dL — ABNORMAL LOW (ref 13.0–17.0)
MCH: 33.8 pg (ref 26.0–34.0)
MCHC: 32.8 g/dL (ref 30.0–36.0)
MCV: 103 fL — ABNORMAL HIGH (ref 80.0–100.0)
Platelets: 110 10*3/uL — ABNORMAL LOW (ref 150–400)
RBC: 2.31 MIL/uL — ABNORMAL LOW (ref 4.22–5.81)
RDW: 19.5 % — ABNORMAL HIGH (ref 11.5–15.5)
WBC: 5.2 10*3/uL (ref 4.0–10.5)
nRBC: 0 % (ref 0.0–0.2)

## 2022-03-28 LAB — RENAL FUNCTION PANEL
Albumin: 1.8 g/dL — ABNORMAL LOW (ref 3.5–5.0)
Anion gap: 11 (ref 5–15)
BUN: 27 mg/dL — ABNORMAL HIGH (ref 8–23)
CO2: 27 mmol/L (ref 22–32)
Calcium: 8.8 mg/dL — ABNORMAL LOW (ref 8.9–10.3)
Chloride: 92 mmol/L — ABNORMAL LOW (ref 98–111)
Creatinine, Ser: 6.11 mg/dL — ABNORMAL HIGH (ref 0.61–1.24)
GFR, Estimated: 10 mL/min — ABNORMAL LOW (ref >60)
Glucose, Bld: 106 mg/dL — ABNORMAL HIGH (ref 70–99)
Phosphorus: 3.8 mg/dL (ref 2.5–4.6)
Potassium: 4.1 mmol/L (ref 3.5–5.1)
Sodium: 130 mmol/L — ABNORMAL LOW (ref 135–145)

## 2022-03-28 MED ORDER — MELATONIN 3 MG PO TABS
3.0000 mg | ORAL_TABLET | Freq: Every day | ORAL | Status: DC
  Administered 2022-03-28: 3 mg via ORAL
  Filled 2022-03-28: qty 1

## 2022-03-28 MED ORDER — ZOLPIDEM TARTRATE 5 MG PO TABS
10.0000 mg | ORAL_TABLET | Freq: Every evening | ORAL | Status: DC | PRN
  Administered 2022-03-29: 10 mg via ORAL
  Filled 2022-03-28: qty 2

## 2022-03-28 NOTE — Progress Notes (Signed)
Physical Therapy Treatment Patient Details Name: Daniel [name] MRN: [id number] DOB: [birth year] Today's Date: 03/28/2022   History of Present Illness Pt is a 61 y/o M admitted on 03/23/22 after presenting to the ED with c/o progressive SOB & LE edema. Chest x-ray with bilateral pleural effusions, right greater than left with diffuse interstitial markings concerning for edema. Pt is being treated for respiratory failure secondary to volume overload in the setting of ESRD on HD. 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 With encouragement pt agreeable to bed level exercises & performs exercises with RLE with instructional cuing for technique. PT educated pt on importance OOB mobility to improve lung function/capacity with pt stating "yeah okay". Continue to recommend HHPT f/u upon d/c.    Recommendations for follow up therapy are one component of a multi-disciplinary discharge planning process, led by the attending physician.  Recommendations may be updated based on patient status, additional functional criteria and insurance authorization.  Follow Up Recommendations  Home health PT     Assistance Recommended at Discharge PRN  Patient can return home with the following A little help with walking and/or transfers;Assistance with cooking/housework;A little help with bathing/dressing/bathroom   Equipment Recommendations  None recommended by PT    Recommendations for Other Services       Precautions / Restrictions Precautions Precautions: Fall Precaution Comments: L BKA w/o prosthetic in room Restrictions Weight Bearing Restrictions: No     Mobility  Bed Mobility               General bed mobility comments: pt declines    Transfers                    General transfer comment: pt declines    Ambulation/Gait                   Stairs             Wheelchair Mobility    Modified Rankin (Stroke Patients Only)       Balance                                            Cognition Arousal/Alertness: Awake/alert Behavior During Therapy: WFL for tasks assessed/performed, Flat affect Overall Cognitive Status: No family/caregiver present to determine baseline cognitive functioning                                 General Comments: WFL, not very receptive of education/encouragement.        Exercises General Exercises - Lower Extremity Hip ABduction/ADduction: AROM, Strengthening, Right, 20 reps, Supine (hip abduction slides x 20, hip adduction pillow squeezes x 20) Straight Leg Raises: AROM, Right, Strengthening, 20 reps, Supine    General Comments        Pertinent Vitals/Pain Pain Assessment Pain Assessment: No/denies pain    Home Living                          Prior Function  PT Goals (current goals can now be found in the care plan section) Acute Rehab PT Goals Patient Stated Goal: get better Time For Goal Achievement: 04/09/22 Potential to Achieve Goals: Good Progress towards PT goals: Progressing toward goals    Frequency    Min 3X/week      PT Plan Current plan remains appropriate    Co-evaluation              AM-PAC PT "6 Clicks" Mobility   Outcome Measure  Help needed turning from your back to your side while in a flat bed without using bedrails?: None Help needed moving from lying on your back to sitting on the side of a flat bed without using bedrails?: None Help needed moving to and from a bed to a chair (including a wheelchair)?: None Help needed standing up from a chair using your arms (e.g., wheelchair or bedside chair)?: None Help needed to walk in hospital room?: Total Help needed climbing 3-5  steps with a railing? : Total 6 Click Score: 18    End of Session   Activity Tolerance: Patient tolerated treatment well Patient left: in bed;with call bell/phone within reach   PT Visit Diagnosis: Muscle weakness (generalized) (M62.81);Other abnormalities of gait and mobility (R26.89)     Time: 8088-1103 PT Time Calculation (min) (ACUTE ONLY): 8 min  Charges:  $Therapeutic Exercise: 8-22 mins                     Lavone Nian, PT, DPT 03/28/22, 9:47 AM   Waunita Schooner 03/28/2022, 9:46 AM

## 2022-03-28 NOTE — Progress Notes (Signed)
[facility] Progress Note   Subjective:    Seen and examined patient at bedside. He reports mild SOB but not in acute respiratory distress. He received consecutive HD txs earlier this week d/t volume overload 2nd short/missed txs.  O2 placed back on at 2L. O2 sats ranging 90-100s. Denies CP, ABD pain, and N/V. Plan for HD today.  Objective Vitals:   03/28/22 0130 03/28/22 0426 03/28/22 0500 03/28/22 0750  BP:  (!) 153/81  (!) 167/84  Pulse:  92  93  Resp:  17  18  Temp:  98 F (36.7 C)  98.4 F (36.9 C)  TempSrc:  Oral  Oral  SpO2: 100% 97%  91%  Weight:   102 kg   Height:       Physical Exam General:chronically ill appearing male in NAD; on 2L O2 [place] Heart:RRR, no mrg Lungs: fine rales L low-mid lobes, clear in the uppers Abdomen:soft, NTND Extremities: R BKA; no LLE edema Dialysis Access: LU AVF +b/t   Filed Weights   03/26/22 1044 03/26/22 1330 03/28/22 0500  Weight: 98.9 kg 95.4 kg 102 kg    Intake/Output Summary (Last 24 hours) at 03/28/2022 1130 Last data filed at 03/27/2022 2300 Gross per 24 hour  Intake 600 ml  Output 300 ml  Net 300 ml    Additional Objective Labs: Basic Metabolic Panel: Recent Labs  Lab 03/24/22 0520 03/25/22 0126 03/26/22 0421  NA 133* 130* 127*  K 3.9 3.7 3.8  CL 95* 89* 91*  CO2 '24 26 27  '$ GLUCOSE 133* 102* 101*  BUN 34* 20 15  CREATININE 8.11* 5.13* 4.07*  CALCIUM 8.7* 8.8* 8.3*  PHOS  --  3.3 2.7   Liver Function Tests: Recent Labs  Lab 03/23/22 0612 03/25/22 0126 03/26/22 0421  AST 57*  --   --   ALT 17  --   --   ALKPHOS 122  --   --   BILITOT 0.9  --   --   PROT 8.7*  --   --   ALBUMIN 1.9* 1.9* 1.8*   No results for input(s): "LIPASE", "AMYLASE" in the last 168 hours. CBC: Recent Labs  Lab 03/23/22 0612 03/24/22 0520 03/25/22 0126 03/26/22 0421 03/27/22 0147  WBC 5.2 5.3 5.0 4.8 4.9  NEUTROABS 3.1  --   --   --   --   HGB 7.7* 7.1* 7.2* 7.0* 7.9*  HCT 24.1* 22.5* 22.0* 21.3* 23.7*   MCV 105.7* 106.6* 105.3* 103.9* 102.6*  PLT 123* 109* 107* 113* 113*   Blood Culture    Component Value Date/Time   SDES PERITONEAL 02/16/2022 1252   SDES PERITONEAL 02/16/2022 1252   [place] 02/16/2022 1252   SPECREQUEST NONE 02/16/2022 1252   CULT  02/16/2022 1252    NO GROWTH 5 DAYS Performed at [facility], [place] [address] [place],  [address]    REPTSTATUS 02/21/2022 FINAL 02/16/2022 1252   REPTSTATUS 02/16/2022 FINAL 02/16/2022 1252    Cardiac Enzymes: No results for input(s): "CKTOTAL", "CKMB", "CKMBINDEX", "TROPONINI" in the last 168 hours. CBG: No results for input(s): "GLUCAP" in the last 168 hours. Iron Studies: No results for input(s): "IRON", "TIBC", "TRANSFERRIN", "FERRITIN" in the last 72 hours. Lab Results  Component Value Date   INR 1.2 02/14/2022   INR 1.1 12/10/2021   INR 1.1 12/09/2021   Studies/Results: No results found.  Medications:   sodium chloride   Intravenous Once   amLODipine  5 mg Oral Daily  atorvastatin  40 mg Oral Daily   Chlorhexidine Gluconate Cloth  6 each Topical Q0600   gabapentin  300 mg Oral TID   heparin  5,000 Units Subcutaneous Q8H   melatonin  3 mg Oral QHS   pantoprazole  40 mg Oral BID AC   sodium chloride flush  3 mL Intravenous Q12H    Dialysis Orders: [facility] MWF 4h  400/1.5   98.8kg  2/2 bath  AVF  15ga  Hep none - mircera 225 q2, last 11/17, due 12/01 - hectorol 4 ug IV tiw  Assessment/Plan: Shortness of breath/vol overload - due to high fluid gains and missed/shortened dialysis. May also have a viral URI with reported nasal congestion.Continue to discuss the importance of fluid restriction. Net UF 4.5L on 11/27, 2.7L on 11/28 and net UF 3.5L 11/29.  Continue max UF as tolerated. Under dry weight if weights correct, will need lower dry on d/c.  ESRD - MWF schedule. Reinforced importance of HD compliance. HD today per regular schedule.   HTN: Blood pressure at goal. Continue home meds.    Anemia: Hgb chronically low with recurrent GI bleeds. On max dose ESA, next dose due 12/01. Hgb 7.9 s/p 1 unit pRBC yesterday.   Metabolic bone disease: CCa high (low albumin), holding VDRA for now. Does not take binders outpatient - phos in goal.   Nutrition:  Renal diet with fluid restrictions. Pt informed he also needs to limit all fluids including ice.  T2DM: management per primary team  Tobie Poet, NP [facility] 03/28/2022,11:30 AM  LOS: 4 days

## 2022-03-28 NOTE — Progress Notes (Signed)
   03/28/22 2209  Vitals  Temp 98.1 F (36.7 C)  Temp Source Oral  BP (!) 147/70  MAP (mmHg) 92  Pulse Rate 93  ECG Heart Rate 93  Resp 13  Post Treatment  Dialyzer Clearance Lightly streaked  Duration of HD Treatment -hour(s) 3.15 hour(s)  Liters Processed 68.7  Fluid Removed (mL) 3200 mL  Tolerated HD Treatment Yes  Post-Hemodialysis Comments Lots of PT moving that throurgh alarms.  AVG/AVF Arterial Site Held (minutes) 5 minutes  AVG/AVF Venous Site Held (minutes) 5 minutes   TX fin. W/o difficulty.

## 2022-03-28 NOTE — Progress Notes (Signed)
Patient is coughing has Robitussin 2m, patient want a different medication,vitals signs stable lungs sounds wheezing ,diminished,non productive cough,MD notified,will continue to monitor.

## 2022-03-28 NOTE — Consult Note (Signed)
   [facility] CM Inpatient Consult   03/28/2022  JEDRICK HUTCHERSON 18-Sep-1960 [id number]  [facility] [facility] [ACO] Patient: Medicare ACO REACH  *Less than 30 days readmission, with extreme high risk score  Primary Care Provider:  [name], NP with [place]  Patient is currently active with [facility] for chronic disease management services.  Patient has been engaged by a [facility] NP-Geriatric.  Our community based plan of care has focused on disease management and community resource support.  Continues with HD.  MD notes reviewed for missed or incomplete treatments. Currently, with staff on rounds.  Plan: Following hospital stay and updates to [facility] NP-G. Continue to follow.  Of note, [facility] Care Management services does not replace or interfere with any services that are needed or arranged by inpatient [facility] care management team.   For additional questions or referrals please contact:  Natividad Brood, RN BSN [place]  [phone] business mobile phone Toll free office [phone]  *[place]  [phone] Fax number: [phone] [email or web address]

## 2022-03-28 NOTE — Progress Notes (Signed)
[facility] PROGRESS NOTE    Progress Note  Daniel [name]  BOF:[id number] DOB: August 25, 1960 DOA: 03/23/2022 PCP: [name], NP     Brief Narrative:   Daniel [name] is an 61 y.o. male past medical history significant for end-stage renal disease on hemodialysis Tuesday Thursday Saturdays, diabetes mellitus type 2, ICH in 2019, PAD status post left BKA history of recurrent GI bleed with 2 duodenal ulcers/AVM, alcoholic cirrhosis with hepatocellular carcinoma right upper lung mass chronic back pain presents to the [place] long ED with progressive shortness of breath and lower extremity edema that started about 4 days prior to admission, he has been compliant with his dialysis, chest x-ray showed bilateral pleural effusion with pulmonary edema.  Assessment/Plan:   Acute respiratory failure with hypoxia secondary to hypervolemia associated with renal insufficiency/ESRD (end stage renal disease) on dialysis : Nephrology was consulted, they do to high volume gains question missed dialysis for short dialysis. Had HD on 03/24/2022, 11/28 and then again 03/26/2022. They have dialyzed and he is negative about 7 L.  Still requiring 2 L of oxygen to keep saturations greater 92%. Try to wean to room air. Nephrology discussed with the patient the importance of compliance with his HD. Physical therapy evaluated the patient, will need home health PT. Will monitor for 24 hours, probably go home tomorrow for dialysis today.  Hypervolemic hyponatremia: Improving with dialysis.  History of recurrent GI bleed secondary to AVM/varices/history of hepatocellular carcinoma/alcoholic cirrhosis: His followed by Dr. Annamaria Boots, underwent transarterial embolization of the right superior hepatic artery procedure on 12/29/2021 by interventional radiology no plan for systemic therapy at this point. Status post 1 unit of packed red blood cells continue Protonix twice a day. Hemoglobin 7.9.  Diabetes  mellitus type 2 controlled: With an A1c of 5.2 continue diet.  Hyperlipidemia:  continue statins.  Essential hypertension: There is now 119/97 continue Norvasc. BP is improved with HD.  Tobacco use: Continue nicotine patch.   Stage II sacral decubitus ulcer: RN Pressure Injury Documentation: Pressure Injury 07/04/21 Buttocks Medial Stage 2 -  Partial thickness loss of dermis presenting as a shallow open injury with a red, pink wound bed without slough. two small areas of broken skin in between buttocks. (Active)  07/04/21 0936  Location: Buttocks  Location Orientation: Medial  Staging: Stage 2 -  Partial thickness loss of dermis presenting as a shallow open injury with a red, pink wound bed without slough.  Wound Description (Comments): two small areas of broken skin in between buttocks.  Present on Admission:     Estimated body mass index is 31.36 kg/m as calculated from the following:   Height as of this encounter: '5\' 11"'$  (1.803 m).   Weight as of this encounter: 102 kg.   DVT prophylaxis: lovenox Family Communication:noen Status is: Inpatient Remains inpatient appropriate because: Volume overload due to missed dialysis    Code Status:     Code Status Orders  (From admission, onward)           Start     Ordered   03/23/22 1704  Full code  Continuous        03/23/22 1703           Code Status History     Date Active Date Inactive Code Status Order ID Comments User Context   02/15/2022 0233 02/21/2022 2058 Full Code [id number]  Kayleen Memos, DO ED   01/02/2022 0539 01/07/2022 1639 Full Code [id number]  Shalhoub, Sherryll Burger, MD  ED   12/25/2021 0816 12/27/2021 1827 Full Code [id number]  Lequita Halt, MD ED   12/25/2021 [phone] 12/25/2021 0816 Full Code [id number]  Howerter, Ethelda Chick, DO ED   12/09/2021 0846 12/11/2021 2259 Full Code [id number]  Samella Parr, NP ED   11/13/2021 0208 11/15/2021 1514 Full Code [id number]  Vernelle Emerald, MD ED   10/05/2021 1230  10/10/2021 1632 Full Code [id number]  Mercy Riding, MD ED   09/28/2021 1107 09/29/2021 2057 Full Code [id number]  Karmen Bongo, MD ED   09/14/2021 1256 09/18/2021 1838 Full Code [id number]  Lequita Halt, MD ED   08/23/2021 0821 08/27/2021 2346 Full Code [id number]  Norval Morton, MD ED   08/16/2021 2249 08/19/2021 1930 Full Code [id number]  Marcelyn Bruins, MD ED   08/05/2021 1321 08/10/2021 1806 Full Code [id number]  Lequita Halt, MD ED   07/03/2021 2110 07/10/2021 2239 Full Code [id number]  Howerter, Ethelda Chick, DO ED   06/13/2021 2009 06/21/2021 2257 Full Code [id number]  Lenore Cordia, MD ED   04/14/2021 1109 04/25/2021 2052 Full Code [id number]  Karmen Bongo, MD ED   04/08/2021 1357 04/09/2021 2149 Full Code [id number]  Dagoberto Ligas, PA-C Inpatient   04/08/2021 1059 04/08/2021 1357 Full Code [id number]  Waynetta Sandy, MD Inpatient   03/24/2021 1428 03/27/2021 1903 Full Code [id number]  Orma Flaming, MD ED   02/23/2021 1315 02/28/2021 2055 Full Code [id number]  Orma Flaming, MD ED   02/07/2021 1638 02/09/2021 1834 Full Code [id number]  Jonetta Osgood, MD ED   01/10/2021 1931 01/13/2021 2035 Full Code [id number]  Orma Flaming, MD ED   10/27/2020 2341 10/29/2020 0222 Full Code [id number]  Shalhoub, Sherryll Burger, MD ED   08/10/2020 0406 08/16/2020 2005 Full Code [id number]  Rise Patience, MD ED   07/31/2019 2033 08/09/2019 2019 Full Code [id number]  Jean Rosenthal, MD ED   07/23/2019 1838 07/25/2019 1628 Full Code [id number]  Seawell, Jaimie A, DO ED   07/06/2019 2147 07/07/2019 2140 Full Code [id number]  Ina Homes, MD ED   06/27/2019 1518 07/01/2019 1927 Full Code [id number]  Marty Heck, DO ED   03/29/2019 1121 03/31/2019 1716 Full Code [id number]  Katherine Roan, MD ED   01/22/2019 1842 01/24/2019 2155 Full Code [id number]  Kathrene Alu, MD ED   05/20/2017 2310 05/25/2017 1920 Full Code [id number]  Greta Doom, MD ED   06/29/2016 1113 07/01/2016 1523 Full Code [id number]  Benito Mccreedy, MD Inpatient   06/28/2013 0907 06/29/2013 0330 Full Code [id number]  Leota Jacobsen, MD ED         IV Access:   Peripheral IV   Procedures and diagnostic studies:   No results found.   Medical Consultants:   None.   Subjective:    Daniel [name] has not been having a good night sleep breathing is improved.  Objective:    Vitals:   03/28/22 0130 03/28/22 0426 03/28/22 0500 03/28/22 0750  BP:  (!) 153/81  (!) 167/84  Pulse:  92  93  Resp:  17  18  Temp:  98 F (36.7 C)  98.4 F (36.9 C)  TempSrc:  Oral  Oral  SpO2: 100% 97%  91%  Weight:   102 kg   Height:       SpO2: 91 % O2 Flow Rate (L/min): 2 L/min   Intake/Output Summary (Last 24 hours) at 03/28/2022 0912 Last data filed at 03/27/2022 2300 Gross per 24  hour  Intake 600 ml  Output 300 ml  Net 300 ml    Filed Weights   03/26/22 1044 03/26/22 1330 03/28/22 0500  Weight: 98.9 kg 95.4 kg 102 kg    Exam: General exam: In no acute distress. Respiratory system: Good air movement and clear to auscultation. Cardiovascular system: S1 & S2 heard, RRR. No JVD. Gastrointestinal system: Abdomen is nondistended, soft and nontender.  Extremities: No pedal edema. Skin: No rashes, lesions or ulcers Psychiatry: Judgement and insight appear normal. Mood & affect appropriate. Data Reviewed:    Labs: Basic Metabolic Panel: Recent Labs  Lab 03/23/22 0612 03/24/22 0520 03/25/22 0126 03/26/22 0421  NA 132* 133* 130* 127*  K 3.9 3.9 3.7 3.8  CL 94* 95* 89* 91*  CO2 '26 24 26 27  '$ GLUCOSE 123* 133* 102* 101*  BUN 29* 34* 20 15  CREATININE 6.84* 8.11* 5.13* 4.07*  CALCIUM 8.9 8.7* 8.8* 8.3*  PHOS  --   --  3.3 2.7    GFR Estimated Creatinine Clearance: 22.9 mL/min (A) (by C-G formula based on SCr of 4.07 mg/dL (H)). Liver Function Tests: Recent Labs  Lab 03/23/22 0612 03/25/22 0126 03/26/22 0421  AST 57*  --   --   ALT 17  --   --   ALKPHOS 122  --   --   BILITOT 0.9  --   --   PROT  8.7*  --   --   ALBUMIN 1.9* 1.9* 1.8*    No results for input(s): "LIPASE", "AMYLASE" in the last 168 hours. No results for input(s): "AMMONIA" in the last 168 hours. Coagulation profile No results for input(s): "INR", "PROTIME" in the last 168 hours. COVID-19 Labs  No results for input(s): "DDIMER", "FERRITIN", "LDH", "CRP" in the last 72 hours.   Lab Results  Component Value Date   SARSCOV2NAA NEGATIVE 03/23/2022   [place] NEGATIVE 01/20/2022   [place] NEGATIVE 12/20/2021   [place] NEGATIVE 11/27/2021    CBC: Recent Labs  Lab 03/23/22 0612 03/24/22 0520 03/25/22 0126 03/26/22 0421 03/27/22 0147  WBC 5.2 5.3 5.0 4.8 4.9  NEUTROABS 3.1  --   --   --   --   HGB 7.7* 7.1* 7.2* 7.0* 7.9*  HCT 24.1* 22.5* 22.0* 21.3* 23.7*  MCV 105.7* 106.6* 105.3* 103.9* 102.6*  PLT 123* 109* 107* 113* 113*    Cardiac Enzymes: No results for input(s): "CKTOTAL", "CKMB", "CKMBINDEX", "TROPONINI" in the last 168 hours. BNP (last 3 results) No results for input(s): "PROBNP" in the last 8760 hours. CBG: No results for input(s): "GLUCAP" in the last 168 hours. D-Dimer: No results for input(s): "DDIMER" in the last 72 hours. Hgb A1c: No results for input(s): "HGBA1C" in the last 72 hours. Lipid Profile: No results for input(s): "CHOL", "HDL", "LDLCALC", "TRIG", "CHOLHDL", "LDLDIRECT" in the last 72 hours. Thyroid function studies: No results for input(s): "TSH", "T4TOTAL", "T3FREE", "THYROIDAB" in the last 72 hours.  Invalid input(s): "FREET3" Anemia work up: No results for input(s): "VITAMINB12", "FOLATE", "FERRITIN", "TIBC", "IRON", "RETICCTPCT" in the last 72 hours.  Sepsis Labs: Recent Labs  Lab 03/24/22 0520 03/25/22 0126 03/26/22 0421 03/27/22 0147  WBC 5.3 5.0 4.8 4.9    Microbiology Recent Results (from the past 240 hour(s))  Resp Panel by RT-PCR (Flu A&B, Covid) Anterior Nasal Swab     Status: None   Collection Time: 03/23/22  1:20 PM   Specimen:  Anterior Nasal Swab  Result Value Ref Range Status   SARS Coronavirus 2 by  RT PCR NEGATIVE NEGATIVE Final    Comment: (NOTE) SARS-CoV-2 target nucleic acids are NOT DETECTED.  The SARS-CoV-2 RNA is generally detectable in upper respiratory specimens during the acute phase of infection. The lowest concentration of SARS-CoV-2 viral copies this assay can detect is 138 copies/mL. A negative result does not preclude SARS-Cov-2 infection and should not be used as the sole basis for treatment or other patient management decisions. A negative result may occur with  improper specimen collection/handling, submission of specimen other than nasopharyngeal swab, presence of viral mutation(s) within the areas targeted by this assay, and inadequate number of viral copies(<138 copies/mL). A negative result must be combined with clinical observations, patient history, and epidemiological information. The expected result is Negative.  Fact Sheet for Patients:  EntrepreneurPulse.com.au  Fact Sheet for Healthcare Providers:  IncredibleEmployment.be  This test is no t yet approved or cleared by the [place] FDA and  has been authorized for detection and/or diagnosis of SARS-CoV-2 by FDA under an Emergency Use Authorization (EUA). This EUA will remain  in effect (meaning this test can be used) for the duration of the COVID-19 declaration under Section 564(b)(1) of the Act, 21 U.S.C.section 360bbb-3(b)(1), unless the authorization is terminated  or revoked sooner.       Influenza A by PCR NEGATIVE NEGATIVE Final   Influenza B by PCR NEGATIVE NEGATIVE Final    Comment: (NOTE) The Xpert Xpress SARS-CoV-2/FLU/RSV plus assay is intended as an aid in the diagnosis of influenza from Nasopharyngeal swab specimens and should not be used as a sole basis for treatment. Nasal washings and aspirates are unacceptable for Xpert Xpress SARS-CoV-2/FLU/RSV testing.  Fact  Sheet for Patients: EntrepreneurPulse.com.au  Fact Sheet for Healthcare Providers: IncredibleEmployment.be  This test is not yet approved or cleared by the [place] FDA and has been authorized for detection and/or diagnosis of SARS-CoV-2 by FDA under an Emergency Use Authorization (EUA). This EUA will remain in effect (meaning this test can be used) for the duration of the COVID-19 declaration under Section 564(b)(1) of the Act, 21 U.S.C. section 360bbb-3(b)(1), unless the authorization is terminated or revoked.  Performed at [facility], [place] [address], [place], [place] [address]      Medications:    sodium chloride   Intravenous Once   albuterol  2.5 mg Nebulization Q6H   amLODipine  5 mg Oral Daily   atorvastatin  40 mg Oral Daily   Chlorhexidine Gluconate Cloth  6 each Topical Q0600   gabapentin  300 mg Oral TID   heparin  5,000 Units Subcutaneous Q8H   pantoprazole  40 mg Oral BID AC   sodium chloride flush  3 mL Intravenous Q12H   Continuous Infusions:    LOS: 4 days   Charlynne Cousins  [facility]  03/28/2022, 9:12 AM

## 2022-03-29 ENCOUNTER — Emergency Department (HOSPITAL_COMMUNITY): Payer: No Typology Code available for payment source

## 2022-03-29 ENCOUNTER — Other Ambulatory Visit: Payer: Self-pay

## 2022-03-29 ENCOUNTER — Emergency Department (HOSPITAL_COMMUNITY)
Admission: EM | Admit: 2022-03-29 | Discharge: 2022-03-30 | Disposition: A | Discharge status: Home / Self Care | Payer: No Typology Code available for payment source | Attending: Emergency Medicine | Admitting: Emergency Medicine

## 2022-03-29 ENCOUNTER — Encounter: Payer: Self-pay | Admitting: Hematology

## 2022-03-29 DIAGNOSIS — J811 Chronic pulmonary edema: Secondary | ICD-10-CM

## 2022-03-29 DIAGNOSIS — R600 Localized edema: Secondary | ICD-10-CM | POA: Insufficient documentation

## 2022-03-29 DIAGNOSIS — Z85118 Personal history of other malignant neoplasm of bronchus and lung: Secondary | ICD-10-CM | POA: Insufficient documentation

## 2022-03-29 DIAGNOSIS — Z041 Encounter for examination and observation following transport accident: Secondary | ICD-10-CM | POA: Insufficient documentation

## 2022-03-29 DIAGNOSIS — E1151 Type 2 diabetes mellitus with diabetic peripheral angiopathy without gangrene: Secondary | ICD-10-CM | POA: Insufficient documentation

## 2022-03-29 DIAGNOSIS — N186 End stage renal disease: Secondary | ICD-10-CM | POA: Diagnosis not present

## 2022-03-29 DIAGNOSIS — E1122 Type 2 diabetes mellitus with diabetic chronic kidney disease: Secondary | ICD-10-CM | POA: Insufficient documentation

## 2022-03-29 DIAGNOSIS — Z993 Dependence on wheelchair: Secondary | ICD-10-CM | POA: Insufficient documentation

## 2022-03-29 DIAGNOSIS — Z992 Dependence on renal dialysis: Secondary | ICD-10-CM | POA: Insufficient documentation

## 2022-03-29 DIAGNOSIS — E8770 Fluid overload, unspecified: Secondary | ICD-10-CM | POA: Diagnosis not present

## 2022-03-29 DIAGNOSIS — Z89512 Acquired absence of left leg below knee: Secondary | ICD-10-CM | POA: Insufficient documentation

## 2022-03-29 DIAGNOSIS — Y92410 Unspecified street and highway as the place of occurrence of the external cause: Secondary | ICD-10-CM | POA: Insufficient documentation

## 2022-03-29 DIAGNOSIS — J9601 Acute respiratory failure with hypoxia: Secondary | ICD-10-CM | POA: Diagnosis not present

## 2022-03-29 DIAGNOSIS — R400 Somnolence: Secondary | ICD-10-CM | POA: Diagnosis not present

## 2022-03-29 DIAGNOSIS — E118 Type 2 diabetes mellitus with unspecified complications: Secondary | ICD-10-CM

## 2022-03-29 DIAGNOSIS — Z8505 Personal history of malignant neoplasm of liver: Secondary | ICD-10-CM | POA: Insufficient documentation

## 2022-03-29 LAB — CBC WITH DIFFERENTIAL/PLATELET
Abs Immature Granulocytes: 0.02 10*3/uL (ref 0.00–0.07)
Basophils Absolute: 0 10*3/uL (ref 0.0–0.1)
Basophils Relative: 1 %
Eosinophils Absolute: 0 10*3/uL (ref 0.0–0.5)
Eosinophils Relative: 1 %
HCT: 25.4 % — ABNORMAL LOW (ref 39.0–52.0)
Hemoglobin: 7.9 g/dL — ABNORMAL LOW (ref 13.0–17.0)
Immature Granulocytes: 1 %
Lymphocytes Relative: 17 %
Lymphs Abs: 0.7 10*3/uL (ref 0.7–4.0)
MCH: 33.5 pg (ref 26.0–34.0)
MCHC: 31.1 g/dL (ref 30.0–36.0)
MCV: 107.6 fL — ABNORMAL HIGH (ref 80.0–100.0)
Monocytes Absolute: 0.8 10*3/uL (ref 0.1–1.0)
Monocytes Relative: 21 %
Neutro Abs: 2.5 10*3/uL (ref 1.7–7.7)
Neutrophils Relative %: 59 %
Platelets: 114 10*3/uL — ABNORMAL LOW (ref 150–400)
RBC: 2.36 MIL/uL — ABNORMAL LOW (ref 4.22–5.81)
RDW: 19.4 % — ABNORMAL HIGH (ref 11.5–15.5)
WBC: 4.1 10*3/uL (ref 4.0–10.5)
nRBC: 0 % (ref 0.0–0.2)

## 2022-03-29 LAB — I-STAT VENOUS BLOOD GAS, ED
Acid-Base Excess: 6 mmol/L — ABNORMAL HIGH (ref 0.0–2.0)
Bicarbonate: 28.3 mmol/L — ABNORMAL HIGH (ref 20.0–28.0)
Calcium, Ion: 0.98 mmol/L — ABNORMAL LOW (ref 1.15–1.40)
HCT: 25 % — ABNORMAL LOW (ref 39.0–52.0)
Hemoglobin: 8.5 g/dL — ABNORMAL LOW (ref 13.0–17.0)
O2 Saturation: 98 %
Potassium: 4.7 mmol/L (ref 3.5–5.1)
Sodium: 130 mmol/L — ABNORMAL LOW (ref 135–145)
TCO2: 29 mmol/L (ref 22–32)
pCO2, Ven: 32.2 mmHg — ABNORMAL LOW (ref 44–60)
pH, Ven: 7.552 — ABNORMAL HIGH (ref 7.25–7.43)
pO2, Ven: 88 mmHg — ABNORMAL HIGH (ref 32–45)

## 2022-03-29 LAB — COMPREHENSIVE METABOLIC PANEL
ALT: 28 U/L (ref 0–44)
AST: 88 U/L — ABNORMAL HIGH (ref 15–41)
Albumin: 1.9 g/dL — ABNORMAL LOW (ref 3.5–5.0)
Alkaline Phosphatase: 92 U/L (ref 38–126)
Anion gap: 8 (ref 5–15)
BUN: 21 mg/dL (ref 8–23)
CO2: 27 mmol/L (ref 22–32)
Calcium: 8.6 mg/dL — ABNORMAL LOW (ref 8.9–10.3)
Chloride: 95 mmol/L — ABNORMAL LOW (ref 98–111)
Creatinine, Ser: 5.41 mg/dL — ABNORMAL HIGH (ref 0.61–1.24)
GFR, Estimated: 11 mL/min — ABNORMAL LOW (ref >60)
Glucose, Bld: 88 mg/dL (ref 70–99)
Potassium: 4.4 mmol/L (ref 3.5–5.1)
Sodium: 130 mmol/L — ABNORMAL LOW (ref 135–145)
Total Bilirubin: 1.1 mg/dL (ref 0.3–1.2)
Total Protein: 8.3 g/dL — ABNORMAL HIGH (ref 6.5–8.1)

## 2022-03-29 LAB — AMMONIA: Ammonia: 34 umol/L (ref 9–35)

## 2022-03-29 LAB — ETHANOL: Alcohol, Ethyl (B): <10 mg/dL (ref <10)

## 2022-03-29 MED ORDER — CALCIUM CARBONATE ANTACID 1250 MG/5ML PO SUSP: 500.0000 mg | Freq: Four times a day (QID) | ORAL | 0 refills | Status: AC | PRN

## 2022-03-29 MED ORDER — NALOXONE HCL 0.4 MG/ML IJ SOLN
0.4000 mg | Freq: Once | INTRAMUSCULAR | Status: AC
  Administered 2022-03-29: 0.4 mg via INTRAVENOUS
  Filled 2022-03-29: qty 1
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
## 2022-03-29 NOTE — Progress Notes (Signed)
[facility] Progress Note   Subjective:    Patient seen and examined at bedside. Tolerated yesterday's HD with net UF 3.2L. He denies any acute complaints. Now on RA. He's scheduled to go home today.  Objective Vitals:   03/28/22 2156 03/28/22 2209 03/29/22 0621 03/29/22 0815  BP: (!) 142/75 (!) 147/70 114/70 (!) 135/55  Pulse: 92 93 64 (!) 107  Resp: '14 13 15 16  '$ Temp: 98.1 F (36.7 C) 98.1 F (36.7 C) 98.9 F (37.2 C) 98.8 F (37.1 C)  TempSrc: Oral Oral Oral Oral  SpO2: 99% 98% 91% 93%  Weight:  98.8 kg    Height:       Physical Exam General:chronically ill appearing male in NAD; on RA Heart:RRR, no mrg Lungs: diminished in lower and clear in uppers Abdomen:soft, NTND Extremities: L BKA; no RLE edema Dialysis Access: LU AVF +b/t   Filed Weights   03/26/22 1330 03/28/22 0500 03/28/22 2209  Weight: 95.4 kg 102 kg 98.8 kg    Intake/Output Summary (Last 24 hours) at 03/29/2022 1112 Last data filed at 03/28/2022 2209 Gross per 24 hour  Intake 100 ml  Output 3200 ml  Net -3100 ml    Additional Objective Labs: Basic Metabolic Panel: Recent Labs  Lab 03/25/22 0126 03/26/22 0421 03/28/22 1831  NA 130* 127* 130*  K 3.7 3.8 4.1  CL 89* 91* 92*  CO2 '26 27 27  '$ GLUCOSE 102* 101* 106*  BUN 20 15 27*  CREATININE 5.13* 4.07* 6.11*  CALCIUM 8.8* 8.3* 8.8*  PHOS 3.3 2.7 3.8   Liver Function Tests: Recent Labs  Lab 03/23/22 0612 03/25/22 0126 03/26/22 0421 03/28/22 1831  AST 57*  --   --   --   ALT 17  --   --   --   ALKPHOS 122  --   --   --   BILITOT 0.9  --   --   --   PROT 8.7*  --   --   --   ALBUMIN 1.9* 1.9* 1.8* 1.8*   No results for input(s): "LIPASE", "AMYLASE" in the last 168 hours. CBC: Recent Labs  Lab 03/23/22 0612 03/24/22 0520 03/25/22 0126 03/26/22 0421 03/27/22 0147 03/28/22 1831  WBC 5.2 5.3 5.0 4.8 4.9 5.2  NEUTROABS 3.1  --   --   --   --   --   HGB 7.7* 7.1* 7.2* 7.0* 7.9* 7.8*  HCT 24.1* 22.5* 22.0* 21.3*  23.7* 23.8*  MCV 105.7* 106.6* 105.3* 103.9* 102.6* 103.0*  PLT 123* 109* 107* 113* 113* 110*   Blood Culture    Component Value Date/Time   SDES PERITONEAL 02/16/2022 1252   SDES PERITONEAL 02/16/2022 1252   SPECREQUEST NONE 02/16/2022 1252   SPECREQUEST NONE 02/16/2022 1252   CULT  02/16/2022 1252    NO GROWTH 5 DAYS Performed at [facility], [place] [address], [place], [place] [address]    REPTSTATUS 02/21/2022 FINAL 02/16/2022 1252   REPTSTATUS 02/16/2022 FINAL 02/16/2022 1252    Cardiac Enzymes: No results for input(s): "CKTOTAL", "CKMB", "CKMBINDEX", "TROPONINI" in the last 168 hours. CBG: No results for input(s): "GLUCAP" in the last 168 hours. Iron Studies: No results for input(s): "IRON", "TIBC", "TRANSFERRIN", "FERRITIN" in the last 72 hours. Lab Results  Component Value Date   INR 1.2 02/14/2022   INR 1.1 12/10/2021   INR 1.1 12/09/2021   Studies/Results: No results found.  Medications:   sodium chloride   Intravenous Once   amLODipine  5 mg Oral Daily   atorvastatin  40 mg Oral Daily   Chlorhexidine Gluconate Cloth  6 each Topical Q0600   gabapentin  300 mg Oral TID   heparin  5,000 Units Subcutaneous Q8H   melatonin  3 mg Oral QHS   pantoprazole  40 mg Oral BID AC   sodium chloride flush  3 mL Intravenous Q12H    Dialysis Orders: [place] MWF 4h  400/1.5   98.8kg  2/2 bath  AVF  15ga  Hep none - mircera 225 q2, last 11/17, due 12/01 - hectorol 4 ug IV tiw  Assessment/Plan: Shortness of breath/vol overload - due to high fluid gains and missed/shortened dialysis. May also have a viral URI with reported nasal congestion.Continue to discuss the importance of fluid restriction. Net UF 4.5L on 11/27, 2.7L on 11/28 and 3.5L 11/29.  Continue max UF as tolerated. Under dry weight if weights correct, will need lower dry on d/c.  ESRD - MWF schedule. Reinforced importance of HD compliance.   HTN: Blood pressure at goal. Continue home meds.   Anemia: Hgb  chronically low with recurrent GI bleeds. On max dose ESA, next dose due 12/01. Hgb 7.8 s/p 1 unit PRBC 03/26/22.   Metabolic bone disease: CCa high (low albumin), holding VDRA for now. Does not take binders outpatient - phos in goal.   Nutrition:  Renal diet with fluid restrictions. Pt informed he also needs to limit all fluids including ice.  T2DM: management per primary team Dispo - okay for dc from renal standpoint. Patient can resume HD in outpatient on Monday.    Tobie Poet, NP [facility] 03/29/2022,11:12 AM  LOS: 5 days

## 2022-03-29 NOTE — ED Notes (Signed)
A little or no reaction to the narcan sl eye opening just before the narcan
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
## 2022-03-29 NOTE — ED Notes (Addendum)
Pt's room air sats were noted to be 80.  Pt placed on 4L [place] with no improvement.  Pt placed on 6 liters and slowly rose O2 into the 90s.  Called for room
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
## 2022-03-29 NOTE — Discharge Summary (Signed)
Physician Discharge Summary  Daniel [name] IRS:[id number] DOB: 08/09/1960 DOA: 03/23/2022  PCP: [name], NP  Admit date: 03/23/2022 Discharge date: 03/29/2022  Admitted From: Home Disposition:  Home  Recommendations for Outpatient Follow-up:  Follow up with PCP in 1-2 weeks Please obtain BMP/CBC in one week   Home Health:No Equipment/Devices:None  Discharge Condition:Stable CODE STATUS:Full Diet recommendation: Heart Healthy   Brief/Interim Summary: 61 y.o. male past medical history significant for end-stage renal disease on hemodialysis Tuesday Thursday Saturdays, diabetes mellitus type 2, ICH in 2019, PAD status post left BKA history of recurrent GI bleed with 2 duodenal ulcers/AVM, alcoholic cirrhosis with hepatocellular carcinoma right upper lung mass chronic back pain presents [facility] with progressive shortness of breath and lower extremity edema that started about 4 days prior to admission, he has been compliant with his dialysis, chest x-ray showed bilateral pleural effusion with pulmonary edema.   Discharge Diagnoses:  Principal Problem:   Hypervolemia associated with renal insufficiency Active Problems:   ESRD (end stage renal disease) on dialysis ([place])   Mixed hyperlipidemia   Hepatocellular carcinoma (HCC)   Diabetes mellitus type 2, controlled, with complications ([place])   Tobacco use disorder   Hx of BKA, left ([place])   Acute respiratory failure with hypoxia ([place])  Acute respiratory failure with hypoxia secondary to hypervolemia associated with renal insufficiency/end-stage renal disease on hemodialysis: Likely due to noncompliance with his dialysis Nephrology was consulted, due to high volume gains question if he needs dialysis. He was dialyzed for 3 days in a row and we were able to wean him to room air. Nephrologist had a really long conversation with the head about the importance of compliance. Physical therapy evaluated the patient  recommended home health PT.  Hypervolemic hyponatremia: Improved with HD.  History of recurrent GI bleed secondary to AVM/varices/history of hepatocellular carcinoma/alcoholic cirrhosis: Follow-up with Dr. Annamaria Boots as an outpatient patient, he recently went transarterial embolization of the right superior hepatic artery on 12/29/2021 by interventional radiology. No plan for systemic therapy at this point in time. As his hemoglobin drifted down he was transfused 1 unit packed red blood cells there were no signs of overt bleeding. After this his hemoglobin remained stable.  Diabetes mellitus type 2: With an A1c of 5.2 continue diet control.  Hyperlipidemia: Continue statins.  Essential hypertension: Continue current regimen no changes made his blood pressure improved after HD was performed 3 days in a row.  Tobacco abuse: Nicotine patch.  Stage II sacral decubitus ulcer present on admission:   Discharge Instructions  Discharge Instructions     Diet - low sodium heart healthy   Complete by: As directed    Increase activity slowly   Complete by: As directed       Allergies as of 03/29/2022       Reactions   Seroquel [quetiapine] Other (See Comments)   Tardive kinesia Dystonia    Dilaudid [hydromorphone Hcl] Itching, Other (See Comments)   Pt reports itchiness after IM injection         Medication List     TAKE these medications    amLODipine 5 MG tablet Commonly known as: NORVASC Take 1 tablet (5 mg total) by mouth daily.   atorvastatin 40 MG tablet Commonly known as: LIPITOR Take 1 tablet (40 mg total) by mouth daily.   calcium carbonate (dosed in mg elemental calcium) 1250 MG/5ML Susp Take 5 mLs (500 mg of elemental calcium total) by mouth every 6 (six) hours  as needed for indigestion.   gabapentin 300 MG capsule Commonly known as: NEURONTIN Take 300 mg by mouth 3 (three) times daily.   loperamide 2 MG capsule Commonly known as: IMODIUM Take 1 capsule (2  mg total) by mouth 4 (four) times daily as needed for diarrhea or loose stools.   pantoprazole 40 MG tablet Commonly known as: PROTONIX Take 40 mg by mouth 2 (two) times daily before a meal.   traZODone 50 MG tablet Commonly known as: DESYREL Take 1 tablet (50 mg total) by mouth daily as needed for sleep.        Allergies  Allergen Reactions   Seroquel [Quetiapine] Other (See Comments)    Tardive kinesia Dystonia    Dilaudid [Hydromorphone Hcl] Itching and Other (See Comments)    Pt reports itchiness after IM injection     Consultations: Nephrology   Procedures/Studies: DG Chest Port 1 View  Result Date: 03/23/2022 CLINICAL DATA:  Shortness of breath EXAM: PORTABLE CHEST 1 VIEW COMPARISON:  Film from earlier in the same day. FINDINGS: Cardiac shadow is enlarged but stable. Aortic calcifications are seen. Bilateral pleural effusions are noted right greater than left. Bibasilar airspace opacities are noted. No pneumothorax is seen. IMPRESSION: Stable appearance of the chest when compared with the earlier exam. Electronically Signed   By: Inez Catalina M.D.   On: 03/23/2022 21:16   DG Chest 2 View  Result Date: 03/23/2022 CLINICAL DATA:  Leg swelling and shortness of breath. EXAM: CHEST - 2 VIEW COMPARISON:  03/11/2022 FINDINGS: Stable cardiomediastinal contours. Lung volumes are low. Bilateral pleural effusions are noted right greater than left. Mild diffuse increase interstitial markings concerning for edema. Similar appearance of left mid and bilateral lower lobe opacities. IMPRESSION: 1. No change in aeration to the lungs compared with previous exam. 2. Persistent bilateral pleural effusions and interstitial edema. Electronically Signed   By: Kerby Moors M.D.   On: 03/23/2022 07:20   DG Chest 1 View  Result Date: 03/11/2022 CLINICAL DATA:  Chest pain EXAM: CHEST  1 VIEW COMPARISON:  Radiograph 02/15/2022 FINDINGS: Unchanged enlarged cardiac silhouette. There are diffuse  interstitial and lower lung predominant airspace opacities bilaterally. Small right and trace left pleural effusions. No evidence of pneumothorax. Left axillary stent. No acute osseous abnormality. IMPRESSION: Bilateral interstitial and lower lung predominant airspace opacities bilaterally with small right and trace left pleural effusions. Findings are most consistent with pulmonary edema, though multifocal infection could have a similar appearance. Electronically Signed   By: Maurine Simmering M.D.   On: 03/11/2022 11:12   (Echo, Carotid, EGD, Colonoscopy, ERCP)    Subjective: No complaints  Discharge Exam: Vitals:   03/29/22 0621 03/29/22 0815  BP: 114/70 (!) 135/55  Pulse: 64 (!) 107  Resp: 15 16  Temp: 98.9 F (37.2 C) 98.8 F (37.1 C)  SpO2: 91% 93%   Vitals:   03/28/22 2156 03/28/22 2209 03/29/22 0621 03/29/22 0815  BP: (!) 142/75 (!) 147/70 114/70 (!) 135/55  Pulse: 92 93 64 (!) 107  Resp: '14 13 15 16  '$ Temp: 98.1 F (36.7 C) 98.1 F (36.7 C) 98.9 F (37.2 C) 98.8 F (37.1 C)  TempSrc: Oral Oral Oral Oral  SpO2: 99% 98% 91% 93%  Weight:  98.8 kg    Height:        General: Pt is alert, awake, not in acute distress Cardiovascular: RRR, S1/S2 +, no rubs, no gallops Respiratory: CTA bilaterally, no wheezing, no rhonchi Abdominal: Soft, NT, ND, bowel sounds +  Extremities: no edema, no cyanosis    The results of significant diagnostics from this hospitalization (including imaging, microbiology, ancillary and laboratory) are listed below for reference.     Microbiology: Recent Results (from the past 240 hour(s))  Resp Panel by RT-PCR (Flu A&B, Covid) Anterior Nasal Swab     Status: None   Collection Time: 03/23/22  1:20 PM   Specimen: Anterior Nasal Swab  Result Value Ref Range Status   SARS Coronavirus 2 by RT PCR NEGATIVE NEGATIVE Final    Comment: (NOTE) SARS-CoV-2 target nucleic acids are NOT DETECTED.  The SARS-CoV-2 RNA is generally detectable in upper  respiratory specimens during the acute phase of infection. The lowest concentration of SARS-CoV-2 viral copies this assay can detect is 138 copies/mL. A negative result does not preclude SARS-Cov-2 infection and should not be used as the sole basis for treatment or other patient management decisions. A negative result may occur with  improper specimen collection/handling, submission of specimen other than nasopharyngeal swab, presence of viral mutation(s) within the areas targeted by this assay, and inadequate number of viral copies(<138 copies/mL). A negative result must be combined with clinical observations, patient history, and epidemiological information. The expected result is Negative.  Fact Sheet for Patients:  EntrepreneurPulse.com.au  Fact Sheet for Healthcare Providers:  IncredibleEmployment.be  This test is no t yet approved or cleared by the [place] FDA and  has been authorized for detection and/or diagnosis of SARS-CoV-2 by FDA under an Emergency Use Authorization (EUA). This EUA will remain  in effect (meaning this test can be used) for the duration of the COVID-19 declaration under Section 564(b)(1) of the Act, 21 U.S.C.section 360bbb-3(b)(1), unless the authorization is terminated  or revoked sooner.       Influenza A by PCR NEGATIVE NEGATIVE Final   Influenza B by PCR NEGATIVE NEGATIVE Final    Comment: (NOTE) The Xpert Xpress SARS-CoV-2/FLU/RSV plus assay is intended as an aid in the diagnosis of influenza from Nasopharyngeal swab specimens and should not be used as a sole basis for treatment. Nasal washings and aspirates are unacceptable for Xpert Xpress SARS-CoV-2/FLU/RSV testing.  Fact Sheet for Patients: EntrepreneurPulse.com.au  Fact Sheet for Healthcare Providers: IncredibleEmployment.be  This test is not yet approved or cleared by the [place] FDA and has been  authorized for detection and/or diagnosis of SARS-CoV-2 by FDA under an Emergency Use Authorization (EUA). This EUA will remain in effect (meaning this test can be used) for the duration of the COVID-19 declaration under Section 564(b)(1) of the Act, 21 U.S.C. section 360bbb-3(b)(1), unless the authorization is terminated or revoked.  Performed at [facility], [place] [address], [place], [place] [address]      Labs: BNP (last 3 results) Recent Labs    10/08/21 1201 01/02/22 0655 02/11/22 2321  BNP 1,688.9* 1,034.3* 109.3*   Basic Metabolic Panel: Recent Labs  Lab 03/23/22 0612 03/24/22 0520 03/25/22 0126 03/26/22 0421 03/28/22 1831  NA 132* 133* 130* 127* 130*  K 3.9 3.9 3.7 3.8 4.1  CL 94* 95* 89* 91* 92*  CO2 '26 24 26 27 27  '$ GLUCOSE 123* 133* 102* 101* 106*  BUN 29* 34* 20 15 27*  CREATININE 6.84* 8.11* 5.13* 4.07* 6.11*  CALCIUM 8.9 8.7* 8.8* 8.3* 8.8*  PHOS  --   --  3.3 2.7 3.8   Liver Function Tests: Recent Labs  Lab 03/23/22 0612 03/25/22 0126 03/26/22 0421 03/28/22 1831  AST 57*  --   --   --  ALT 17  --   --   --   ALKPHOS 122  --   --   --   BILITOT 0.9  --   --   --   PROT 8.7*  --   --   --   ALBUMIN 1.9* 1.9* 1.8* 1.8*   No results for input(s): "LIPASE", "AMYLASE" in the last 168 hours. No results for input(s): "AMMONIA" in the last 168 hours. CBC: Recent Labs  Lab 03/23/22 0612 03/24/22 0520 03/25/22 0126 03/26/22 0421 03/27/22 0147 03/28/22 1831  WBC 5.2 5.3 5.0 4.8 4.9 5.2  NEUTROABS 3.1  --   --   --   --   --   HGB 7.7* 7.1* 7.2* 7.0* 7.9* 7.8*  HCT 24.1* 22.5* 22.0* 21.3* 23.7* 23.8*  MCV 105.7* 106.6* 105.3* 103.9* 102.6* 103.0*  PLT 123* 109* 107* 113* 113* 110*   Cardiac Enzymes: No results for input(s): "CKTOTAL", "CKMB", "CKMBINDEX", "TROPONINI" in the last 168 hours. BNP: Invalid input(s): "POCBNP" CBG: No results for input(s): "GLUCAP" in the last 168 hours. D-Dimer No results for input(s): "DDIMER" in the  last 72 hours. Hgb A1c No results for input(s): "HGBA1C" in the last 72 hours. Lipid Profile No results for input(s): "CHOL", "HDL", "LDLCALC", "TRIG", "CHOLHDL", "LDLDIRECT" in the last 72 hours. Thyroid function studies No results for input(s): "TSH", "T4TOTAL", "T3FREE", "THYROIDAB" in the last 72 hours.  Invalid input(s): "FREET3" Anemia work up No results for input(s): "VITAMINB12", "FOLATE", "FERRITIN", "TIBC", "IRON", "RETICCTPCT" in the last 72 hours. Urinalysis    Component Value Date/Time   COLORURINE YELLOW 12/09/2021 0900   APPEARANCEUR CLEAR 12/09/2021 0900   LABSPEC 1.008 12/09/2021 0900   PHURINE 5.0 12/09/2021 0900   GLUCOSEU NEGATIVE 12/09/2021 0900   HGBUR MODERATE (A) 12/09/2021 0900   BILIRUBINUR NEGATIVE 12/09/2021 0900   KETONESUR NEGATIVE 12/09/2021 0900   PROTEINUR 100 (A) 12/09/2021 0900   UROBILINOGEN 0.2 12/31/2009 0746   NITRITE NEGATIVE 12/09/2021 0900   LEUKOCYTESUR NEGATIVE 12/09/2021 0900   Sepsis Labs Recent Labs  Lab 03/25/22 0126 03/26/22 0421 03/27/22 0147 03/28/22 1831  WBC 5.0 4.8 4.9 5.2   Microbiology Recent Results (from the past 240 hour(s))  Resp Panel by RT-PCR (Flu A&B, Covid) Anterior Nasal Swab     Status: None   Collection Time: 03/23/22  1:20 PM   Specimen: Anterior Nasal Swab  Result Value Ref Range Status   SARS Coronavirus 2 by RT PCR NEGATIVE NEGATIVE Final    Comment: (NOTE) SARS-CoV-2 target nucleic acids are NOT DETECTED.  The SARS-CoV-2 RNA is generally detectable in upper respiratory specimens during the acute phase of infection. The lowest concentration of SARS-CoV-2 viral copies this assay can detect is 138 copies/mL. A negative result does not preclude SARS-Cov-2 infection and should not be used as the sole basis for treatment or other patient management decisions. A negative result may occur with  improper specimen collection/handling, submission of specimen other than nasopharyngeal swab, presence of  viral mutation(s) within the areas targeted by this assay, and inadequate number of viral copies(<138 copies/mL). A negative result must be combined with clinical observations, patient history, and epidemiological information. The expected result is Negative.  Fact Sheet for Patients:  EntrepreneurPulse.com.au  Fact Sheet for Healthcare Providers:  IncredibleEmployment.be  This test is no t yet approved or cleared by the [place] FDA and  has been authorized for detection and/or diagnosis of SARS-CoV-2 by FDA under an Emergency Use Authorization (EUA). This EUA will remain  in  effect (meaning this test can be used) for the duration of the COVID-19 declaration under Section 564(b)(1) of the Act, 21 U.S.C.section 360bbb-3(b)(1), unless the authorization is terminated  or revoked sooner.       Influenza A by PCR NEGATIVE NEGATIVE Final   Influenza B by PCR NEGATIVE NEGATIVE Final    Comment: (NOTE) The Xpert Xpress SARS-CoV-2/FLU/RSV plus assay is intended as an aid in the diagnosis of influenza from Nasopharyngeal swab specimens and should not be used as a sole basis for treatment. Nasal washings and aspirates are unacceptable for Xpert Xpress SARS-CoV-2/FLU/RSV testing.  Fact Sheet for Patients: EntrepreneurPulse.com.au  Fact Sheet for Healthcare Providers: IncredibleEmployment.be  This test is not yet approved or cleared by the [place] FDA and has been authorized for detection and/or diagnosis of SARS-CoV-2 by FDA under an Emergency Use Authorization (EUA). This EUA will remain in effect (meaning this test can be used) for the duration of the COVID-19 declaration under Section 564(b)(1) of the Act, 21 U.S.C. section 360bbb-3(b)(1), unless the authorization is terminated or revoked.  Performed at [facility], [place] [address], [place], [place] [address]      SIGNED:   Charlynne Cousins, MD  [facility] 03/29/2022, 9:02 AM Pager   If 7PM-7AM, please contact night-coverage [email or web address] Password [email or web address]
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
## 2022-03-29 NOTE — TOC Transition Note (Signed)
Transition of Care [facility]) - CM/SW Discharge Note   Patient Details  Name: Daniel [name] MRN: [id number] Date of Birth: 29-May-1960  Transition of [facility]) CM/SW Contact:  Bartholomew Crews, RN Phone Number: [phone] 03/29/2022, 9:51 AM   Clinical Narrative:     Patient to transition home. Liaison at [facility] notified via voicemail. HH orders in place. Noted in previous RNCM note that patient plans to drive self home from hospital. No TOC needs identified at this time.   Final next level of care: [place] Barriers to Discharge: No Barriers Identified   Patient Goals and CMS Choice Patient states their goals for this hospitalization and ongoing recovery are:: to reurn to home CMS Medicare.gov Compare Post Acute Care list provided to:: Patient Choice offered to / list presented to : Patient  Discharge Placement                       Discharge Plan and Services   Discharge Planning Services: CM Consult Post Acute Care Choice: Home Health          DME Arranged: N/A         HH Arranged: PT, OT, RN   Date HH Agency Contacted: 03/29/22 Time HH Agency Contacted: [phone] Representative spoke with at [place]: messaged left for [place] Determinants of Health (SDOH) Interventions Housing Interventions: Inpatient TOC   Readmission Risk Interventions    10/10/2021   10:49 AM 02/27/2021   12:51 PM 07/07/2019    4:03 PM  Readmission Risk Prevention Plan  Transportation Screening Complete Complete Complete  PCP or Specialist Appt within 3-5 Days   Complete  HRI or [place]   Complete  Social Work Consult for [place] Planning/Counseling   Complete  Palliative Care Screening   Not Applicable  Medication Review Press photographer) Complete Complete Complete  PCP or Specialist appointment within 3-5 days of discharge Complete Complete   HRI or [place] Complete Complete   SW Recovery Care/Counseling Consult Complete Complete    Palliative Care Screening Complete Not [place] Patient Refused Not Applicable
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
## 2022-03-29 NOTE — Discharge Instructions (Addendum)
Follow-up as planned with your doctors.  Make sure you are going to dialysis regularly to help get the fluid off.

## 2022-03-30 ENCOUNTER — Encounter: Payer: Self-pay | Admitting: Hematology

## 2022-03-30 LAB — I-STAT VENOUS BLOOD GAS, ED
Acid-Base Excess: 3 mmol/L — ABNORMAL HIGH (ref 0.0–2.0)
Bicarbonate: 28.7 mmol/L — ABNORMAL HIGH (ref 20.0–28.0)
Calcium, Ion: 1.14 mmol/L — ABNORMAL LOW (ref 1.15–1.40)
HCT: 27 % — ABNORMAL LOW (ref 39.0–52.0)
Hemoglobin: 9.2 g/dL — ABNORMAL LOW (ref 13.0–17.0)
O2 Saturation: 88 %
Potassium: 4.3 mmol/L (ref 3.5–5.1)
Sodium: 132 mmol/L — ABNORMAL LOW (ref 135–145)
TCO2: 30 mmol/L (ref 22–32)
pCO2, Ven: 47.3 mmHg (ref 44–60)
pH, Ven: 7.391 (ref 7.25–7.43)
pO2, Ven: 56 mmHg — ABNORMAL HIGH (ref 32–45)

## 2022-03-31 ENCOUNTER — Encounter: Payer: Self-pay | Admitting: *Deleted

## 2022-03-31 ENCOUNTER — Telehealth: Payer: Self-pay

## 2022-03-31 ENCOUNTER — Telehealth: Payer: Self-pay | Admitting: *Deleted

## 2022-03-31 ENCOUNTER — Telehealth (HOSPITAL_COMMUNITY): Payer: Self-pay | Admitting: Nephrology

## 2022-03-31 NOTE — Patient Outreach (Signed)
  Care Coordination   03/31/2022 Name: Daniel [name] MRN: [id number] DOB: 1960/08/18   Care Coordination Outreach Attempts:  An unsuccessful telephone outreach was attempted today to offer the patient information about available care coordination services as a benefit of their health plan.   Follow Up Plan:  Additional outreach attempts will be made to offer the patient care coordination information and services.   Encounter Outcome:  No Answer   Care Coordination Interventions:  No, not indicated    SIG Kaiyon Hynes C. Myrtie Neither, MSN, [facility] Gerontological Nurse Practitioner [facility] Care Management [phone]
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
## 2022-03-31 NOTE — Telephone Encounter (Signed)
Transition of care contact from inpatient facility  Date of Discharge: 03/29/22 Date of Contact: 03/31/22 - attempted Method of contact: Phone  Attempted to contact patient to discuss transition of care from inpatient admission. Patient did not answer the phone. Unable to leave a message d/t "malibox full."  Veneta Penton, PA-C [employer] Pager [phone]

## 2022-04-01 ENCOUNTER — Encounter: Payer: Self-pay | Admitting: *Deleted

## 2022-04-01 ENCOUNTER — Ambulatory Visit: Payer: Medicare Other | Admitting: Cardiovascular Disease

## 2022-04-01 ENCOUNTER — Telehealth: Payer: Self-pay

## 2022-04-01 MED ORDER — AMLODIPINE BESYLATE 5 MG PO TABS: 5.0000 mg | ORAL_TABLET | Freq: Every day | ORAL | 2 refills | Status: DC

## 2022-04-01 MED ORDER — ATORVASTATIN CALCIUM 40 MG PO TABS: 40.0000 mg | ORAL_TABLET | Freq: Every day | ORAL | 2 refills | Status: DC

## 2022-04-01 NOTE — Telephone Encounter (Signed)
Refill sent for amlodipine and atorvastatin. Pantoprazole is listed as historical. I will defer this, calcium, and trazodone to PCP.
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
## 2022-04-02 NOTE — Telephone Encounter (Signed)
Called patient to inform him that refills were sent to his pharmacy for amlodipine and atorvastatin. Voicemail full, unable to leave a message.  His PCP will need to address the requests for calcium carbonate and trazodone..  he has an appointment with PCP tomorrow - 04/03/2022.

## 2022-04-03 ENCOUNTER — Emergency Department (HOSPITAL_COMMUNITY)
Admission: EM | Admit: 2022-04-03 | Discharge: 2022-04-03 | Disposition: A | Discharge status: Home / Self Care | Payer: Medicare Other | Attending: Emergency Medicine | Admitting: Emergency Medicine

## 2022-04-03 ENCOUNTER — Emergency Department (HOSPITAL_COMMUNITY): Payer: Medicare Other

## 2022-04-03 ENCOUNTER — Ambulatory Visit (INDEPENDENT_AMBULATORY_CARE_PROVIDER_SITE_OTHER): Payer: Medicare Other | Admitting: Primary Care

## 2022-04-03 DIAGNOSIS — I10 Essential (primary) hypertension: Secondary | ICD-10-CM

## 2022-04-03 DIAGNOSIS — I120 Hypertensive chronic kidney disease with stage 5 chronic kidney disease or end stage renal disease: Secondary | ICD-10-CM | POA: Diagnosis not present

## 2022-04-03 DIAGNOSIS — R072 Precordial pain: Secondary | ICD-10-CM

## 2022-04-03 DIAGNOSIS — E1122 Type 2 diabetes mellitus with diabetic chronic kidney disease: Secondary | ICD-10-CM | POA: Diagnosis not present

## 2022-04-03 DIAGNOSIS — N186 End stage renal disease: Secondary | ICD-10-CM | POA: Diagnosis not present

## 2022-04-03 DIAGNOSIS — R058 Other specified cough: Secondary | ICD-10-CM | POA: Diagnosis not present

## 2022-04-03 DIAGNOSIS — R079 Chest pain, unspecified: Secondary | ICD-10-CM | POA: Diagnosis not present

## 2022-04-03 DIAGNOSIS — M542 Cervicalgia: Secondary | ICD-10-CM | POA: Diagnosis present

## 2022-04-03 DIAGNOSIS — Z1152 Encounter for screening for COVID-19: Secondary | ICD-10-CM | POA: Insufficient documentation

## 2022-04-03 DIAGNOSIS — Z992 Dependence on renal dialysis: Secondary | ICD-10-CM | POA: Diagnosis not present

## 2022-04-03 LAB — CBC WITH DIFFERENTIAL/PLATELET
Abs Immature Granulocytes: 0.01 10*3/uL (ref 0.00–0.07)
Basophils Absolute: 0 10*3/uL (ref 0.0–0.1)
Basophils Relative: 0 %
Eosinophils Absolute: 0.1 10*3/uL (ref 0.0–0.5)
Eosinophils Relative: 2 %
HCT: 23.4 % — ABNORMAL LOW (ref 39.0–52.0)
Hemoglobin: 7.6 g/dL — ABNORMAL LOW (ref 13.0–17.0)
Immature Granulocytes: 0 %
Lymphocytes Relative: 18 %
Lymphs Abs: 0.8 10*3/uL (ref 0.7–4.0)
MCH: 33.6 pg (ref 26.0–34.0)
MCHC: 32.5 g/dL (ref 30.0–36.0)
MCV: 103.5 fL — ABNORMAL HIGH (ref 80.0–100.0)
Monocytes Absolute: 0.9 10*3/uL (ref 0.1–1.0)
Monocytes Relative: 20 %
Neutro Abs: 2.8 10*3/uL (ref 1.7–7.7)
Neutrophils Relative %: 60 %
Platelets: 122 10*3/uL — ABNORMAL LOW (ref 150–400)
RBC: 2.26 MIL/uL — ABNORMAL LOW (ref 4.22–5.81)
RDW: 19.1 % — ABNORMAL HIGH (ref 11.5–15.5)
WBC: 4.7 10*3/uL (ref 4.0–10.5)
nRBC: 0 % (ref 0.0–0.2)

## 2022-04-03 LAB — TROPONIN I (HIGH SENSITIVITY)
Troponin I (High Sensitivity): 135 ng/L (ref <18)
Troponin I (High Sensitivity): 134 ng/L (ref <18)

## 2022-04-03 LAB — BASIC METABOLIC PANEL
Anion gap: 9 (ref 5–15)
BUN: 28 mg/dL — ABNORMAL HIGH (ref 8–23)
CO2: 29 mmol/L (ref 22–32)
Calcium: 8.2 mg/dL — ABNORMAL LOW (ref 8.9–10.3)
Chloride: 94 mmol/L — ABNORMAL LOW (ref 98–111)
Creatinine, Ser: 6.33 mg/dL — ABNORMAL HIGH (ref 0.61–1.24)
GFR, Estimated: 9 mL/min — ABNORMAL LOW (ref >60)
Glucose, Bld: 107 mg/dL — ABNORMAL HIGH (ref 70–99)
Potassium: 4.4 mmol/L (ref 3.5–5.1)
Sodium: 132 mmol/L — ABNORMAL LOW (ref 135–145)

## 2022-04-03 LAB — RESP PANEL BY RT-PCR (FLU A&B, COVID) ARPGX2
Influenza A by PCR: NEGATIVE
Influenza B by PCR: NEGATIVE
SARS Coronavirus 2 by RT PCR: NEGATIVE

## 2022-04-03 MED ORDER — MORPHINE SULFATE (PF) 2 MG/ML IV SOLN
2.0000 mg | Freq: Once | INTRAVENOUS | Status: AC
  Administered 2022-04-03: 2 mg via INTRAVENOUS
  Filled 2022-04-03: qty 1

## 2022-04-03 MED ORDER — HYDROCODONE-ACETAMINOPHEN 5-325 MG PO TABS
1.0000 | ORAL_TABLET | Freq: Once | ORAL | Status: AC
  Administered 2022-04-03: 1 via ORAL
  Filled 2022-04-03: qty 1

## 2022-04-03 MED ORDER — MORPHINE SULFATE (PF) 4 MG/ML IV SOLN: 4.0000 mg | Freq: Once | INTRAVENOUS | Status: DC

## 2022-04-03 MED ORDER — CYCLOBENZAPRINE HCL 10 MG PO TABS
5.0000 mg | ORAL_TABLET | Freq: Once | ORAL | Status: AC
  Administered 2022-04-03: 5 mg via ORAL
  Filled 2022-04-03: qty 1

## 2022-04-03 MED ORDER — TRAMADOL HCL 50 MG PO TABS
50.0000 mg | ORAL_TABLET | Freq: Once | ORAL | Status: AC
  Administered 2022-04-03: 50 mg via ORAL
  Filled 2022-04-03: qty 1
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
## 2022-04-03 NOTE — ED Provider Notes (Signed)
Pt signed out by Dr Dina Rich that if initial trop elev, but no cp, and that if delta trop no increasing from initial to d/c to home.   Delta trop same as initial. On recheck, pt continues to deny any chest pain or discomfort. Denies sob or unusual doe.   Mild right foot pain, and left trapezius area pain/tenderness.   Ultram po.   Pt has eaten/drank. States feels improved. Denies cp or sob, and indicates feels ready for d/c.   Pt currently appears stable for d/c.   Rec close pcp/cardiology f/u - referral made.   Return precautions provided.      Lajean Saver, MD 04/03/22 [phone]

## 2022-04-03 NOTE — Discharge Instructions (Addendum)
It was our pleasure to provide your ER care today - we hope that you feel better.  Follow up closely with cardiologist in the coming week.  Make sure to go to all of your dialysis sessions per your normal routine.   Return to ER right away if worse, new symptoms, fevers, recurrent or persistent chest pain, increased trouble breathing, increased pain or redness or swelling to foot, or other emergency concern.

## 2022-04-03 NOTE — ED Notes (Signed)
ED provider at bedside.

## 2022-04-03 NOTE — ED Notes (Addendum)
Patient verbalizes understanding of discharge instructions. Opportunity for questioning and answers were provided. Pt A and O x 4,given cab voucher; Pt discharged from ED.

## 2022-04-06 ENCOUNTER — Encounter: Payer: Self-pay | Admitting: Hematology

## 2022-04-06 ENCOUNTER — Other Ambulatory Visit: Payer: Self-pay

## 2022-04-06 ENCOUNTER — Emergency Department (HOSPITAL_COMMUNITY): Payer: Medicare Other

## 2022-04-06 ENCOUNTER — Encounter (HOSPITAL_COMMUNITY): Payer: Self-pay | Admitting: Internal Medicine

## 2022-04-06 ENCOUNTER — Inpatient Hospital Stay (HOSPITAL_COMMUNITY)
Admission: EM | Admit: 2022-04-06 | Discharge: 2022-04-08 | DRG: 640 | Disposition: A | Discharge status: Home / Self Care | Payer: Medicare Other | Attending: Internal Medicine | Admitting: Internal Medicine

## 2022-04-06 DIAGNOSIS — Z91199 Patient's noncompliance with other medical treatment and regimen due to unspecified reason: Secondary | ICD-10-CM | POA: Diagnosis not present

## 2022-04-06 DIAGNOSIS — Z89512 Acquired absence of left leg below knee: Secondary | ICD-10-CM

## 2022-04-06 DIAGNOSIS — J9601 Acute respiratory failure with hypoxia: Secondary | ICD-10-CM | POA: Diagnosis present

## 2022-04-06 DIAGNOSIS — N289 Disorder of kidney and ureter, unspecified: Secondary | ICD-10-CM

## 2022-04-06 DIAGNOSIS — Z888 Allergy status to other drugs, medicaments and biological substances status: Secondary | ICD-10-CM | POA: Diagnosis not present

## 2022-04-06 DIAGNOSIS — K7030 Alcoholic cirrhosis of liver without ascites: Secondary | ICD-10-CM | POA: Diagnosis present

## 2022-04-06 DIAGNOSIS — Z79891 Long term (current) use of opiate analgesic: Secondary | ICD-10-CM

## 2022-04-06 DIAGNOSIS — E1122 Type 2 diabetes mellitus with diabetic chronic kidney disease: Secondary | ICD-10-CM | POA: Diagnosis present

## 2022-04-06 DIAGNOSIS — M542 Cervicalgia: Secondary | ICD-10-CM | POA: Diagnosis present

## 2022-04-06 DIAGNOSIS — Z716 Tobacco abuse counseling: Secondary | ICD-10-CM

## 2022-04-06 DIAGNOSIS — Z79899 Other long term (current) drug therapy: Secondary | ICD-10-CM

## 2022-04-06 DIAGNOSIS — E785 Hyperlipidemia, unspecified: Secondary | ICD-10-CM | POA: Diagnosis present

## 2022-04-06 DIAGNOSIS — E1151 Type 2 diabetes mellitus with diabetic peripheral angiopathy without gangrene: Secondary | ICD-10-CM | POA: Diagnosis present

## 2022-04-06 DIAGNOSIS — Z923 Personal history of irradiation: Secondary | ICD-10-CM | POA: Diagnosis not present

## 2022-04-06 DIAGNOSIS — R079 Chest pain, unspecified: Secondary | ICD-10-CM | POA: Diagnosis not present

## 2022-04-06 DIAGNOSIS — E861 Hypovolemia: Secondary | ICD-10-CM | POA: Diagnosis present

## 2022-04-06 DIAGNOSIS — I120 Hypertensive chronic kidney disease with stage 5 chronic kidney disease or end stage renal disease: Secondary | ICD-10-CM | POA: Diagnosis present

## 2022-04-06 DIAGNOSIS — E871 Hypo-osmolality and hyponatremia: Secondary | ICD-10-CM | POA: Diagnosis present

## 2022-04-06 DIAGNOSIS — Z5982 Transportation insecurity: Secondary | ICD-10-CM

## 2022-04-06 DIAGNOSIS — Z1152 Encounter for screening for COVID-19: Secondary | ICD-10-CM | POA: Diagnosis not present

## 2022-04-06 DIAGNOSIS — Z992 Dependence on renal dialysis: Secondary | ICD-10-CM | POA: Diagnosis not present

## 2022-04-06 DIAGNOSIS — E875 Hyperkalemia: Secondary | ICD-10-CM | POA: Diagnosis present

## 2022-04-06 DIAGNOSIS — N2581 Secondary hyperparathyroidism of renal origin: Secondary | ICD-10-CM | POA: Diagnosis present

## 2022-04-06 DIAGNOSIS — F17210 Nicotine dependence, cigarettes, uncomplicated: Secondary | ICD-10-CM | POA: Diagnosis present

## 2022-04-06 DIAGNOSIS — G8929 Other chronic pain: Secondary | ICD-10-CM | POA: Diagnosis present

## 2022-04-06 DIAGNOSIS — R7989 Other specified abnormal findings of blood chemistry: Secondary | ICD-10-CM

## 2022-04-06 DIAGNOSIS — N186 End stage renal disease: Secondary | ICD-10-CM | POA: Diagnosis present

## 2022-04-06 DIAGNOSIS — Z885 Allergy status to narcotic agent status: Secondary | ICD-10-CM | POA: Diagnosis not present

## 2022-04-06 DIAGNOSIS — D631 Anemia in chronic kidney disease: Secondary | ICD-10-CM | POA: Diagnosis present

## 2022-04-06 DIAGNOSIS — E8770 Fluid overload, unspecified: Secondary | ICD-10-CM | POA: Diagnosis present

## 2022-04-06 DIAGNOSIS — R072 Precordial pain: Secondary | ICD-10-CM

## 2022-04-06 DIAGNOSIS — Z8505 Personal history of malignant neoplasm of liver: Secondary | ICD-10-CM

## 2022-04-06 DIAGNOSIS — Z91158 Patient's noncompliance with renal dialysis for other reason: Secondary | ICD-10-CM

## 2022-04-06 LAB — CBC WITH DIFFERENTIAL/PLATELET
Abs Immature Granulocytes: 0.03 10*3/uL (ref 0.00–0.07)
Basophils Absolute: 0.1 10*3/uL (ref 0.0–0.1)
Basophils Relative: 1 %
Eosinophils Absolute: 0.1 10*3/uL (ref 0.0–0.5)
Eosinophils Relative: 2 %
HCT: 22 % — ABNORMAL LOW (ref 39.0–52.0)
Hemoglobin: 7.4 g/dL — ABNORMAL LOW (ref 13.0–17.0)
Immature Granulocytes: 1 %
Lymphocytes Relative: 16 %
Lymphs Abs: 0.8 10*3/uL (ref 0.7–4.0)
MCH: 34.3 pg — ABNORMAL HIGH (ref 26.0–34.0)
MCHC: 33.6 g/dL (ref 30.0–36.0)
MCV: 101.9 fL — ABNORMAL HIGH (ref 80.0–100.0)
Monocytes Absolute: 0.9 10*3/uL (ref 0.1–1.0)
Monocytes Relative: 17 %
Neutro Abs: 3.4 10*3/uL (ref 1.7–7.7)
Neutrophils Relative %: 63 %
Platelets: 132 10*3/uL — ABNORMAL LOW (ref 150–400)
RBC: 2.16 MIL/uL — ABNORMAL LOW (ref 4.22–5.81)
RDW: 18.1 % — ABNORMAL HIGH (ref 11.5–15.5)
WBC: 5.3 10*3/uL (ref 4.0–10.5)
nRBC: 0 % (ref 0.0–0.2)

## 2022-04-06 LAB — CBC
HCT: 23.2 % — ABNORMAL LOW (ref 39.0–52.0)
Hemoglobin: 7.8 g/dL — ABNORMAL LOW (ref 13.0–17.0)
MCH: 34.4 pg — ABNORMAL HIGH (ref 26.0–34.0)
MCHC: 33.6 g/dL (ref 30.0–36.0)
MCV: 102.2 fL — ABNORMAL HIGH (ref 80.0–100.0)
Platelets: 126 10*3/uL — ABNORMAL LOW (ref 150–400)
RBC: 2.27 MIL/uL — ABNORMAL LOW (ref 4.22–5.81)
RDW: 18.3 % — ABNORMAL HIGH (ref 11.5–15.5)
WBC: 4.7 10*3/uL (ref 4.0–10.5)
nRBC: 0 % (ref 0.0–0.2)

## 2022-04-06 LAB — COMPREHENSIVE METABOLIC PANEL
ALT: 56 U/L — ABNORMAL HIGH (ref 0–44)
AST: 195 U/L — ABNORMAL HIGH (ref 15–41)
Albumin: 1.9 g/dL — ABNORMAL LOW (ref 3.5–5.0)
Alkaline Phosphatase: 105 U/L (ref 38–126)
Anion gap: 12 (ref 5–15)
BUN: 51 mg/dL — ABNORMAL HIGH (ref 8–23)
CO2: 25 mmol/L (ref 22–32)
Calcium: 8.6 mg/dL — ABNORMAL LOW (ref 8.9–10.3)
Chloride: 91 mmol/L — ABNORMAL LOW (ref 98–111)
Creatinine, Ser: 9.78 mg/dL — ABNORMAL HIGH (ref 0.61–1.24)
GFR, Estimated: 6 mL/min — ABNORMAL LOW (ref >60)
Glucose, Bld: 103 mg/dL — ABNORMAL HIGH (ref 70–99)
Potassium: 5.7 mmol/L — ABNORMAL HIGH (ref 3.5–5.1)
Sodium: 128 mmol/L — ABNORMAL LOW (ref 135–145)
Total Bilirubin: 1.5 mg/dL — ABNORMAL HIGH (ref 0.3–1.2)
Total Protein: 8.7 g/dL — ABNORMAL HIGH (ref 6.5–8.1)

## 2022-04-06 LAB — LACTIC ACID, PLASMA
Lactic Acid, Venous: 1.3 mmol/L (ref 0.5–1.9)
Lactic Acid, Venous: 0.9 mmol/L (ref 0.5–1.9)

## 2022-04-06 LAB — CBG MONITORING, ED
Glucose-Capillary: 87 mg/dL (ref 70–99)
Glucose-Capillary: 86 mg/dL (ref 70–99)
Glucose-Capillary: 95 mg/dL (ref 70–99)

## 2022-04-06 LAB — MRSA NEXT GEN BY PCR, NASAL: MRSA by PCR Next Gen: NOT DETECTED

## 2022-04-06 LAB — BASIC METABOLIC PANEL
Anion gap: 13 (ref 5–15)
BUN: 54 mg/dL — ABNORMAL HIGH (ref 8–23)
CO2: 23 mmol/L (ref 22–32)
Calcium: 8.6 mg/dL — ABNORMAL LOW (ref 8.9–10.3)
Chloride: 91 mmol/L — ABNORMAL LOW (ref 98–111)
Creatinine, Ser: 10.02 mg/dL — ABNORMAL HIGH (ref 0.61–1.24)
GFR, Estimated: 5 mL/min — ABNORMAL LOW (ref >60)
Glucose, Bld: 90 mg/dL (ref 70–99)
Potassium: 5.5 mmol/L — ABNORMAL HIGH (ref 3.5–5.1)
Sodium: 127 mmol/L — ABNORMAL LOW (ref 135–145)

## 2022-04-06 LAB — TROPONIN I (HIGH SENSITIVITY)
Troponin I (High Sensitivity): 32 ng/L — ABNORMAL HIGH (ref <18)
Troponin I (High Sensitivity): 33 ng/L — ABNORMAL HIGH (ref <18)

## 2022-04-06 LAB — RESP PANEL BY RT-PCR (RSV, FLU A&B, COVID)  RVPGX2
Influenza A by PCR: NEGATIVE
Influenza B by PCR: NEGATIVE
Resp Syncytial Virus by PCR: NEGATIVE
SARS Coronavirus 2 by RT PCR: NEGATIVE

## 2022-04-06 LAB — GLUCOSE, CAPILLARY: Glucose-Capillary: 84 mg/dL (ref 70–99)

## 2022-04-06 LAB — HEPATITIS B SURFACE ANTIGEN: Hepatitis B Surface Ag: NONREACTIVE

## 2022-04-06 LAB — BRAIN NATRIURETIC PEPTIDE: B Natriuretic Peptide: 809.3 pg/mL — ABNORMAL HIGH (ref 0.0–100.0)

## 2022-04-06 MED ORDER — LIDOCAINE HCL (PF) 1 % IJ SOLN: 5.0000 mL | INTRAMUSCULAR | Status: DC | PRN

## 2022-04-06 MED ORDER — CAMPHOR-MENTHOL 0.5-0.5 % EX LOTN
TOPICAL_LOTION | CUTANEOUS | Status: DC | PRN
  Filled 2022-04-06: qty 222

## 2022-04-06 MED ORDER — INSULIN ASPART 100 UNIT/ML IJ SOLN: 0.0000 [IU] | Freq: Three times a day (TID) | INTRAMUSCULAR | Status: DC

## 2022-04-06 MED ORDER — SODIUM ZIRCONIUM CYCLOSILICATE 10 G PO PACK
10.0000 g | PACK | Freq: Two times a day (BID) | ORAL | Status: AC
  Administered 2022-04-06 (×2): 10 g via ORAL
  Filled 2022-04-06 (×2): qty 1

## 2022-04-06 MED ORDER — LIDOCAINE 5 % EX PTCH
1.0000 | MEDICATED_PATCH | CUTANEOUS | Status: DC
  Administered 2022-04-06: 1 via TRANSDERMAL
  Filled 2022-04-06: qty 1

## 2022-04-06 MED ORDER — DICLOFENAC SODIUM 1 % EX GEL
2.0000 g | Freq: Four times a day (QID) | CUTANEOUS | Status: DC
  Administered 2022-04-06 – 2022-04-08 (×7): 2 g via TOPICAL
  Filled 2022-04-06: qty 100

## 2022-04-06 MED ORDER — LIDOCAINE-PRILOCAINE 2.5-2.5 % EX CREA: 1.0000 | TOPICAL_CREAM | CUTANEOUS | Status: DC | PRN

## 2022-04-06 MED ORDER — OXYCODONE HCL 5 MG PO TABS
5.0000 mg | ORAL_TABLET | Freq: Four times a day (QID) | ORAL | Status: DC | PRN
  Administered 2022-04-06 – 2022-04-07 (×2): 5 mg via ORAL
  Filled 2022-04-06 (×2): qty 1

## 2022-04-06 MED ORDER — AMLODIPINE BESYLATE 5 MG PO TABS
5.0000 mg | ORAL_TABLET | Freq: Every day | ORAL | Status: DC
  Administered 2022-04-06 – 2022-04-08 (×3): 5 mg via ORAL
  Filled 2022-04-06 (×3): qty 1

## 2022-04-06 MED ORDER — ACETAMINOPHEN 325 MG PO TABS
650.0000 mg | ORAL_TABLET | ORAL | Status: DC | PRN
  Filled 2022-04-06: qty 2

## 2022-04-06 MED ORDER — FENTANYL CITRATE PF 50 MCG/ML IJ SOSY
50.0000 ug | PREFILLED_SYRINGE | Freq: Once | INTRAMUSCULAR | Status: AC
  Administered 2022-04-06: 50 ug via INTRAVENOUS
  Filled 2022-04-06: qty 1

## 2022-04-06 MED ORDER — ONDANSETRON HCL 4 MG PO TABS: 4.0000 mg | ORAL_TABLET | Freq: Four times a day (QID) | ORAL | Status: DC | PRN

## 2022-04-06 MED ORDER — NICOTINE 21 MG/24HR TD PT24
21.0000 mg | MEDICATED_PATCH | Freq: Every day | TRANSDERMAL | Status: DC
  Administered 2022-04-06: 21 mg via TRANSDERMAL
  Filled 2022-04-06 (×3): qty 1

## 2022-04-06 MED ORDER — ALBUTEROL SULFATE (2.5 MG/3ML) 0.083% IN NEBU
2.5000 mg | INHALATION_SOLUTION | RESPIRATORY_TRACT | Status: DC | PRN
  Administered 2022-04-06: 2.5 mg via RESPIRATORY_TRACT
  Filled 2022-04-06: qty 3

## 2022-04-06 MED ORDER — PENTAFLUOROPROP-TETRAFLUOROETH EX AERO: 1.0000 | INHALATION_SPRAY | CUTANEOUS | Status: DC | PRN

## 2022-04-06 MED ORDER — ACETAMINOPHEN 325 MG PO TABS
650.0000 mg | ORAL_TABLET | Freq: Once | ORAL | Status: DC
  Filled 2022-04-06: qty 2

## 2022-04-06 MED ORDER — ONDANSETRON HCL 4 MG/2ML IJ SOLN: 4.0000 mg | Freq: Four times a day (QID) | INTRAMUSCULAR | Status: DC | PRN

## 2022-04-06 MED ORDER — GUAIFENESIN 100 MG/5ML PO LIQD
5.0000 mL | ORAL | Status: DC | PRN
  Administered 2022-04-06: 5 mL via ORAL
  Filled 2022-04-06: qty 15

## 2022-04-06 MED ORDER — HEPARIN SODIUM (PORCINE) 5000 UNIT/ML IJ SOLN
5000.0000 [IU] | Freq: Three times a day (TID) | INTRAMUSCULAR | Status: DC
  Administered 2022-04-06 – 2022-04-08 (×7): 5000 [IU] via SUBCUTANEOUS
  Filled 2022-04-06 (×7): qty 1

## 2022-04-06 NOTE — Progress Notes (Signed)
Patient admitted after midnight with chest pain and SOB in the setting of missing HD and being volume overloaded.  HD planned for today per nephrology, please see H&P for further details.  Patient currently in a progressive bed, will down grade to tele. Eulogio Bear DO

## 2022-04-06 NOTE — ED Notes (Signed)
ED TO INPATIENT HANDOFF REPORT    S Name/Age/Gender Daniel [name] 61 y.o. male Room/Bed: 003C/003C  Code Status   Code Status: Full Code  Home/SNF/Other Home Patient oriented to: self, place, time, and situation Is this baseline? Yes   Triage Complete: Triage complete  Chief Complaint Fluid overload [E87.70] Volume overload [E87.70]  Triage Note BIB EMS from home for right leg swelling x 3 days and SOB x 1 day. Last dialysis Thursday.  85%RA -> 97%[place] 4L     Allergies Allergies  Allergen Reactions   Seroquel [Quetiapine] Other (See Comments)    Tardive kinesia Dystonia    Dilaudid [Hydromorphone Hcl] Itching and Other (See Comments)    Pt reports itchiness after IM injection     Level of Care/Admitting Diagnosis ED Disposition     ED Disposition  Admit   Condition  --   [place]: [place] [[id number]]  Level of Care: Telemetry Medical [104]  May admit patient to [facility] or [facility] if equivalent level of care is available:: No  Covid Evaluation: Asymptomatic - no recent exposure (last 10 days) testing not required  Diagnosis: Volume overload [045409]  Admitting Physician: Geradine Girt [place]  Attending Physician: Geradine Girt [[id number]]  Certification:: I certify this patient will need inpatient services for at least 2 midnights          B Medical/Surgery History Past Medical History:  Diagnosis Date   Anemia    Diabetes mellitus without complication [facility])    patient denies   Dialysis patient [facility])    End stage chronic kidney disease ([place])    Hypertension    ICH (intracerebral hemorrhage) ([place]) 05/20/2017   PAD (peripheral artery disease) ([place])    Shoulder pain, left 06/28/2013   Past Surgical History:  Procedure Laterality Date   A/V FISTULAGRAM N/A 08/15/2020   Procedure: A/V FISTULAGRAM - Left Upper;  Surgeon: Cherre Robins, MD;  Location: [place] [facility];  Service: Cardiovascular;   Laterality: N/A;   ABDOMINAL AORTOGRAM W/LOWER EXTREMITY N/A 04/08/2021   Procedure: ABDOMINAL AORTOGRAM W/LOWER EXTREMITY;  Surgeon: Waynetta Sandy, MD;  Location: [place] [facility];  Service: Cardiovascular;  Laterality: N/A;   AMPUTATION Left 04/16/2021   Procedure: LEFT BELOW KNEE AMPUTATION;  Surgeon: Angelia Mould, MD;  Location: [place];  Service: Vascular;  Laterality: Left;   AMPUTATION Left 06/17/2021   Procedure: REVISION AMPUTATION BELOW KNEE;  Surgeon: Broadus John, MD;  Location: [place];  Service: Vascular;  Laterality: Left;   APPENDECTOMY     APPLICATION OF WOUND VAC Left 06/17/2021   Procedure: APPLICATION OF WOUND VAC;  Surgeon: Broadus John, MD;  Location: [place];  Service: Vascular;  Laterality: Left;   AV FISTULA PLACEMENT Left 08/03/2019   Procedure: LEFT ARM ARTERIOVENOUS (AV) CEPHALIC  FISTULA CREATION;  Surgeon: Waynetta Sandy, MD;  Location: [place];  Service: Vascular;  Laterality: Left;   BIOPSY  06/30/2019   Procedure: BIOPSY;  Surgeon: Ronald Lobo, MD;  Location: [facility] ENDOSCOPY;  Service: Endoscopy;;   BIOPSY  08/02/2019   Procedure: BIOPSY;  Surgeon: Yetta Flock, MD;  Location: [facility] ENDOSCOPY;  Service: Gastroenterology;;   BIOPSY  02/08/2021   Procedure: BIOPSY;  Surgeon: Sharyn Creamer, MD;  Location: [facility] ENDOSCOPY;  Service: Gastroenterology;;   BIOPSY  02/24/2021   Procedure: BIOPSY;  Surgeon: [name], MD;  Location: [facility] ENDOSCOPY;  Service: Gastroenterology;;   BIOPSY  03/26/2021  Procedure: BIOPSY;  Surgeon: Lavena Bullion, DO;  Location: [place];  Service: Gastroenterology;;   BIOPSY  08/06/2021   Procedure: BIOPSY;  Surgeon: [name], MD;  Location: [place];  Service: Gastroenterology;;   BIOPSY  09/15/2021   Procedure: BIOPSY;  Surgeon: Sharyn Creamer, MD;  Location: [place];  Service: Gastroenterology;;   BRONCHIAL BIOPSY  12/26/2021   Procedure: BRONCHIAL BIOPSIES;  Surgeon:  Juanito Doom, MD;  Location: [place];  Service: Cardiopulmonary;;   BRONCHIAL WASHINGS  12/26/2021   Procedure: BRONCHIAL WASHINGS;  Surgeon: Juanito Doom, MD;  Location: [place];  Service: Cardiopulmonary;;   COLONOSCOPY  01/23/2012   Procedure: COLONOSCOPY;  Surgeon: Danie Binder, MD;  [facility];  Service: Endoscopy;  Laterality: N/A;  11:10 AM   COLONOSCOPY WITH PROPOFOL N/A 06/30/2019   Procedure: COLONOSCOPY WITH PROPOFOL;  Surgeon: Ronald Lobo, MD;  Location: [place];  Service: Endoscopy;  Laterality: N/A;   ENTEROSCOPY N/A 08/02/2019   Procedure: ENTEROSCOPY;  Surgeon: Yetta Flock, MD;  Location: [facility] ENDOSCOPY;  Service: Gastroenterology;  Laterality: N/A;   ENTEROSCOPY N/A 02/24/2021   Procedure: ENTEROSCOPY;  Surgeon: [name], MD;  Location: [facility] ENDOSCOPY;  Service: Gastroenterology;  Laterality: N/A;   ENTEROSCOPY N/A 03/26/2021   Procedure: ENTEROSCOPY;  Surgeon: Lavena Bullion, DO;  Location: [place];  Service: Gastroenterology;  Laterality: N/A;   ENTEROSCOPY N/A 07/05/2021   Procedure: ENTEROSCOPY;  Surgeon: Carol Ada, MD;  Location: [place];  Service: Gastroenterology;  Laterality: N/A;   ENTEROSCOPY N/A 08/06/2021   Procedure: ENTEROSCOPY;  Surgeon: [name], MD;  Location: [facility] ENDOSCOPY;  Service: Gastroenterology;  Laterality: N/A;   ENTEROSCOPY N/A 08/24/2021   Procedure: ENTEROSCOPY;  Surgeon: Ladene Artist, MD;  Location: [facility] ENDOSCOPY;  Service: Gastroenterology;  Laterality: N/A;   ENTEROSCOPY N/A 09/15/2021   Procedure: ENTEROSCOPY;  Surgeon: Sharyn Creamer, MD;  Location: [facility] ENDOSCOPY;  Service: Gastroenterology;  Laterality: N/A;   ENTEROSCOPY N/A 10/06/2021   Procedure: ENTEROSCOPY;  Surgeon: [name], MD;  Location: [facility] ENDOSCOPY;  Service: Gastroenterology;  Laterality: N/A;   ENTEROSCOPY N/A 11/14/2021   Procedure: ENTEROSCOPY;  Surgeon: Doran Stabler, MD;   Location: [facility] ENDOSCOPY;  Service: Gastroenterology;  Laterality: N/A;   ESOPHAGOGASTRODUODENOSCOPY N/A 08/10/2020   Procedure: ESOPHAGOGASTRODUODENOSCOPY (EGD);  Surgeon: Jerene Bears, MD;  Location: [facility] ENDOSCOPY;  Service: Gastroenterology;  Laterality: N/A;   ESOPHAGOGASTRODUODENOSCOPY (EGD) WITH PROPOFOL N/A 06/30/2019   Procedure: ESOPHAGOGASTRODUODENOSCOPY (EGD) WITH PROPOFOL;  Surgeon: Ronald Lobo, MD;  Location: [place];  Service: Endoscopy;  Laterality: N/A;   ESOPHAGOGASTRODUODENOSCOPY (EGD) WITH PROPOFOL N/A 01/12/2021   Procedure: ESOPHAGOGASTRODUODENOSCOPY (EGD) WITH PROPOFOL;  Surgeon: Sharyn Creamer, MD;  Location: [place];  Service: Gastroenterology;  Laterality: N/A;   ESOPHAGOGASTRODUODENOSCOPY (EGD) WITH PROPOFOL N/A 02/08/2021   Procedure: ESOPHAGOGASTRODUODENOSCOPY (EGD) WITH PROPOFOL;  Surgeon: Sharyn Creamer, MD;  Location: [place];  Service: Gastroenterology;  Laterality: N/A;   FISTULA SUPERFICIALIZATION Left 10/17/2019   Procedure: LEFT UPPER EXTREMITY FISTULA REVISION, SIDE BRANCH LIGATION,  AND SUPERFICIALIZATION;  Surgeon: Marty Heck, MD;  Location: [place];  Service: Vascular;  Laterality: Left;   FISTULA SUPERFICIALIZATION Left 19/62/2297   Procedure: PLICATION OF ANEURYSM LEFT ARTERIOVENOUS FISTULA;  Surgeon: Angelia Mould, MD;  Location: [place];  Service: Vascular;  Laterality: Left;   FLEXIBLE SIGMOIDOSCOPY N/A 07/05/2021   Procedure: FLEXIBLE SIGMOIDOSCOPY;  Surgeon: Carol Ada, MD;  Location: [place];  Service: Gastroenterology;  Laterality: N/A;   GIVENS CAPSULE STUDY N/A 06/30/2019   Procedure: GIVENS CAPSULE STUDY;  Surgeon: Ronald Lobo, MD;  Location: [place];  Service: Endoscopy;  Laterality: N/A;   GIVENS CAPSULE STUDY N/A 08/25/2021   Procedure: GIVENS CAPSULE STUDY;  Surgeon: Ladene Artist, MD;  Location: [place];  Service: Gastroenterology;  Laterality: N/A;   GIVENS CAPSULE STUDY N/A 10/06/2021    Procedure: GIVENS CAPSULE STUDY;  Surgeon: [name], MD;  Location: [place];  Service: Gastroenterology;  Laterality: N/A;   HEMOSTASIS CLIP PLACEMENT  08/10/2020   Procedure: HEMOSTASIS CLIP PLACEMENT;  Surgeon: Jerene Bears, MD;  Location: [place];  Service: Gastroenterology;;   HEMOSTASIS CLIP PLACEMENT  01/12/2021   Procedure: HEMOSTASIS CLIP PLACEMENT;  Surgeon: Sharyn Creamer, MD;  Location: [place];  Service: Gastroenterology;;   HEMOSTASIS CONTROL  08/02/2019   Procedure: HEMOSTASIS CONTROL;  Surgeon: Yetta Flock, MD;  Location: [place];  Service: Gastroenterology;;   HOT HEMOSTASIS  02/24/2021   Procedure: HOT HEMOSTASIS (ARGON PLASMA COAGULATION/BICAP);  Surgeon: [name], MD;  Location: [facility] ENDOSCOPY;  Service: Gastroenterology;;   HOT HEMOSTASIS N/A 03/26/2021   Procedure: HOT HEMOSTASIS (ARGON PLASMA COAGULATION/BICAP);  Surgeon: Lavena Bullion, DO;  Location: [facility] ENDOSCOPY;  Service: Gastroenterology;  Laterality: N/A;   HOT HEMOSTASIS N/A 07/05/2021   Procedure: HOT HEMOSTASIS (ARGON PLASMA COAGULATION/BICAP);  Surgeon: Carol Ada, MD;  Location: [place];  Service: Gastroenterology;  Laterality: N/A;   HOT HEMOSTASIS N/A 09/15/2021   Procedure: HOT HEMOSTASIS (ARGON PLASMA COAGULATION/BICAP);  Surgeon: Sharyn Creamer, MD;  Location: [place];  Service: Gastroenterology;  Laterality: N/A;   HOT HEMOSTASIS N/A 10/06/2021   Procedure: HOT HEMOSTASIS (ARGON PLASMA COAGULATION/BICAP);  Surgeon: [name], MD;  Location: [place];  Service: Gastroenterology;  Laterality: N/A;   INCISION AND DRAINAGE ABSCESS N/A 06/29/2016   Procedure: INCISION AND DRAINAGE ABDOMINAL WALL ABSCESS;  Surgeon: Alphonsa Overall, MD;  [facility];  Service: General;  Laterality: N/A;   INSERTION OF DIALYSIS CATHETER Right 08/03/2019   Procedure: INSERTION OF DIALYSIS CATHETER;  Surgeon: Waynetta Sandy, MD;  Location: [place];   Service: Vascular;  Laterality: Right;   INSERTION OF DIALYSIS CATHETER Right 10/22/2019   Procedure: INSERTION OF 23CM TUNNELED DIALYSIS CATHETER RIGHT INTERNAL JUGULAR;  Surgeon: Angelia Mould, MD;  Location: [place];  Service: Vascular;  Laterality: Right;   INSERTION OF DIALYSIS CATHETER Right 08/12/2020   Procedure: INSERTION OF Right internal Jugular TUNNELED  DIALYSIS CATHETER.;  Surgeon: Waynetta Sandy, MD;  Location: [place];  Service: Vascular;  Laterality: Right;   INSERTION OF DIALYSIS CATHETER N/A 04/19/2021   Procedure: INSERTION OF TUNNELED DIALYSIS CATHETER;  Surgeon: Angelia Mould, MD;  Location: [place];  Service: Vascular;  Laterality: N/A;   IR 3D INDEPENDENT WKST  11/07/2021   IR ANGIOGRAM SELECTIVE EACH ADDITIONAL VESSEL  11/07/2021   IR ANGIOGRAM SELECTIVE EACH ADDITIONAL VESSEL  11/07/2021   IR ANGIOGRAM SELECTIVE EACH ADDITIONAL VESSEL  11/07/2021   IR ANGIOGRAM SELECTIVE EACH ADDITIONAL VESSEL  11/28/2021   IR ANGIOGRAM VISCERAL SELECTIVE  11/07/2021   IR ANGIOGRAM VISCERAL SELECTIVE  11/28/2021   IR EMBO ARTERIAL NOT HEMORR HEMANG INC GUIDE ROADMAPPING  11/07/2021   IR EMBO TUMOR ORGAN ISCHEMIA INFARCT INC GUIDE ROADMAPPING  11/28/2021   IR PARACENTESIS  02/20/2022   IR RADIOLOGIST EVAL & MGMT  09/12/2021   IR US GUIDE VASC ACCESS LEFT  11/07/2021   IR US GUIDE VASC  ACCESS LEFT  11/28/2021   Left heel surgery     LOWER EXTREMITY ANGIOGRAPHY N/A 07/08/2021   Procedure: Lower Extremity Angiography;  Surgeon: Cherre Robins, MD;  Location: [place] [facility];  Service: Cardiovascular;  Laterality: N/A;   PENILE BIOPSY N/A 03/26/2020   Procedure: PENILE ULCER DEBRIDEMENT;  Surgeon: Remi Haggard, MD;  [facility];  Service: Urology;  Laterality: N/A;  30 MINS   PERIPHERAL VASCULAR INTERVENTION Right 07/08/2021   Procedure: PERIPHERAL VASCULAR INTERVENTION;  Surgeon: Cherre Robins, MD;  Location: [place] [facility];  Service: Cardiovascular;   Laterality: Right;   SCLEROTHERAPY  01/12/2021   Procedure: SCLEROTHERAPY;  Surgeon: Sharyn Creamer, MD;  Location: [facility] ENDOSCOPY;  Service: Gastroenterology;;   Clide Deutscher  02/24/2021   Procedure: Clide Deutscher;  Surgeon: [name], MD;  Location: [place];  Service: Gastroenterology;;   SUBMUCOSAL TATTOO INJECTION  08/24/2021   Procedure: SUBMUCOSAL TATTOO INJECTION;  Surgeon: Ladene Artist, MD;  Location: [place];  Service: Gastroenterology;;   SUBMUCOSAL TATTOO INJECTION  09/15/2021   Procedure: SUBMUCOSAL TATTOO INJECTION;  Surgeon: Sharyn Creamer, MD;  Location: [place];  Service: Gastroenterology;;   VIDEO BRONCHOSCOPY N/A 12/26/2021   Procedure: VIDEO BRONCHOSCOPY WITH FLUORO;  Surgeon: Juanito Doom, MD;  Location: [place];  Service: Cardiopulmonary;  Laterality: N/A;     A IV Location/Drains/Wounds Patient Lines/Drains/Airways Status     Active Line/Drains/Airways     Name Placement date Placement time Site Days   Peripheral IV 04/06/22 20 G Anterior;Right Forearm 04/06/22  0200  Forearm  less than 1   Fistula / Graft Left Other (Comment) Arteriovenous fistula 08/03/19  1210  Other (Comment)  977   Fistula / Graft Left Upper arm Arteriovenous fistula --  --  Upper arm  --   Incision (Closed) 04/16/21 Leg Left 04/16/21  0902  -- 355   Incision (Closed) 04/19/21 Leg Left 04/19/21  1201  -- 352   Incision (Closed) 04/19/21 Arm Left 04/19/21  1204  -- 352   Incision (Closed) 08/05/21 Leg Left;Lower 08/05/21  2055  -- 244   Pressure Injury 07/04/21 Buttocks Medial Stage 2 -  Partial thickness loss of dermis presenting as a shallow open injury with a red, pink wound bed without slough. two small areas of broken skin in between buttocks. 07/04/21  0936  -- 276   Wound / Incision (Open or Dehisced) 02/24/21 Non-pressure wound Tibial Left;Posterior open wound covered in eschar on back of left calf, painful 02/24/21  2000  Tibial  406             Intake/Output Last 24 hours No intake or output data in the 24 hours ending 04/06/22 1433  Labs/Imaging Results for orders placed or performed during the hospital encounter of 04/06/22 (from the past 48 hour(s))  Resp panel by RT-PCR (RSV, Flu A&B, Covid) Anterior Nasal Swab     Status: None   Collection Time: 04/06/22  1:54 AM   Specimen: Anterior Nasal Swab  Result Value Ref Range   SARS Coronavirus 2 by RT PCR NEGATIVE NEGATIVE    Comment: (NOTE) SARS-CoV-2 target nucleic acids are NOT DETECTED.  The SARS-CoV-2 RNA is generally detectable in upper respiratory specimens during the acute phase of infection. The lowest concentration of SARS-CoV-2 viral copies this assay can detect is 138 copies/mL. A negative result does not preclude SARS-Cov-2 infection and should not be used as the sole basis for treatment or other patient management decisions.  A negative result may occur with  improper specimen collection/handling, submission of specimen other than nasopharyngeal swab, presence of viral mutation(s) within the areas targeted by this assay, and inadequate number of viral copies(<138 copies/mL). A negative result must be combined with clinical observations, patient history, and epidemiological information. The expected result is Negative.  Fact Sheet for Patients:  EntrepreneurPulse.com.au  Fact Sheet for Healthcare Providers:  IncredibleEmployment.be  This test is no t yet approved or cleared by the [place] FDA and  has been authorized for detection and/or diagnosis of SARS-CoV-2 by FDA under an Emergency Use Authorization (EUA). This EUA will remain  in effect (meaning this test can be used) for the duration of the COVID-19 declaration under Section 564(b)(1) of the Act, 21 U.S.C.section 360bbb-3(b)(1), unless the authorization is terminated  or revoked sooner.       Influenza A by PCR NEGATIVE NEGATIVE   Influenza B by PCR  NEGATIVE NEGATIVE    Comment: (NOTE) The Xpert Xpress SARS-CoV-2/FLU/RSV plus assay is intended as an aid in the diagnosis of influenza from Nasopharyngeal swab specimens and should not be used as a sole basis for treatment. Nasal washings and aspirates are unacceptable for Xpert Xpress SARS-CoV-2/FLU/RSV testing.  Fact Sheet for Patients: EntrepreneurPulse.com.au  Fact Sheet for Healthcare Providers: IncredibleEmployment.be  This test is not yet approved or cleared by the [place] FDA and has been authorized for detection and/or diagnosis of SARS-CoV-2 by FDA under an Emergency Use Authorization (EUA). This EUA will remain in effect (meaning this test can be used) for the duration of the COVID-19 declaration under Section 564(b)(1) of the Act, 21 U.S.C. section 360bbb-3(b)(1), unless the authorization is terminated or revoked.     Resp Syncytial Virus by PCR NEGATIVE NEGATIVE    Comment: (NOTE) Fact Sheet for Patients: EntrepreneurPulse.com.au  Fact Sheet for Healthcare Providers: IncredibleEmployment.be  This test is not yet approved or cleared by the [place] FDA and has been authorized for detection and/or diagnosis of SARS-CoV-2 by FDA under an Emergency Use Authorization (EUA). This EUA will remain in effect (meaning this test can be used) for the duration of the COVID-19 declaration under Section 564(b)(1) of the Act, 21 U.S.C. section 360bbb-3(b)(1), unless the authorization is terminated or revoked.  Performed at [facility], [place] [address] [place], [place] [address]   Comprehensive metabolic panel     Status: Abnormal   Collection Time: 04/06/22  1:56 AM  Result Value Ref Range   Sodium 128 (L) 135 - 145 mmol/L   Potassium 5.7 (H) 3.5 - 5.1 mmol/L   Chloride 91 (L) 98 - 111 mmol/L   CO2 25 22 - 32 mmol/L   Glucose, Bld 103 (H) 70 - 99 mg/dL    Comment: Glucose reference  range applies only to samples taken after fasting for at least 8 hours.   BUN 51 (H) 8 - 23 mg/dL   Creatinine, Ser 9.78 (H) 0.61 - 1.24 mg/dL   Calcium 8.6 (L) 8.9 - 10.3 mg/dL   Total Protein 8.7 (H) 6.5 - 8.1 g/dL   Albumin 1.9 (L) 3.5 - 5.0 g/dL   AST 195 (H) 15 - 41 U/L   ALT 56 (H) 0 - 44 U/L   Alkaline Phosphatase 105 38 - 126 U/L   Total Bilirubin 1.5 (H) 0.3 - 1.2 mg/dL   GFR, Estimated 6 (L) >60 mL/min    Comment: (NOTE) Calculated using the CKD-EPI Creatinine Equation (2021)    Anion gap 12 5 -  15    Comment: Performed at [facility], [place] [address], [place], [place] [address]  CBC with Differential     Status: Abnormal   Collection Time: 04/06/22  1:56 AM  Result Value Ref Range   WBC 5.3 4.0 - 10.5 K/uL   RBC 2.16 (L) 4.22 - 5.81 MIL/uL   Hemoglobin 7.4 (L) 13.0 - 17.0 g/dL   HCT 22.0 (L) 39.0 - 52.0 %   MCV 101.9 (H) 80.0 - 100.0 fL   MCH 34.3 (H) 26.0 - 34.0 pg   MCHC 33.6 30.0 - 36.0 g/dL   RDW 18.1 (H) 11.5 - 15.5 %   Platelets 132 (L) 150 - 400 K/uL    Comment: REPEATED TO VERIFY   nRBC 0.0 0.0 - 0.2 %   Neutrophils Relative % 63 %   Neutro Abs 3.4 1.7 - 7.7 K/uL   Lymphocytes Relative 16 %   Lymphs Abs 0.8 0.7 - 4.0 K/uL   Monocytes Relative 17 %   Monocytes Absolute 0.9 0.1 - 1.0 K/uL   Eosinophils Relative 2 %   Eosinophils Absolute 0.1 0.0 - 0.5 K/uL   Basophils Relative 1 %   Basophils Absolute 0.1 0.0 - 0.1 K/uL   Immature Granulocytes 1 %   Abs Immature Granulocytes 0.03 0.00 - 0.07 K/uL    Comment: Performed at [facility], [place] [address], [place], [place] [address]  Troponin I (High Sensitivity)     Status: Abnormal   Collection Time: 04/06/22  1:56 AM  Result Value Ref Range   Troponin I (High Sensitivity) 32 (H) <18 ng/L    Comment: (NOTE) Elevated high sensitivity troponin I (hsTnI) values and significant  changes across serial measurements may suggest ACS but many other  chronic and acute conditions are known to elevate  hsTnI results.  Refer to the "Links" section for chest pain algorithms and additional  guidance. Performed at [facility], [place] [address] [place], [place] [address]   Brain natriuretic peptide     Status: Abnormal   Collection Time: 04/06/22  1:56 AM  Result Value Ref Range   B Natriuretic Peptide 809.3 (H) 0.0 - 100.0 pg/mL    Comment: Performed at [facility] [address] [place], [place] [address]  Lactic acid, plasma     Status: None   Collection Time: 04/06/22  1:56 AM  Result Value Ref Range   Lactic Acid, Venous 1.3 0.5 - 1.9 mmol/L    Comment: Performed at [facility] [address] [place], [place] [address]  Culture, blood (routine x 2)     Status: None (Preliminary result)   Collection Time: 04/06/22  1:56 AM   Specimen: BLOOD RIGHT FOREARM  Result Value Ref Range   Specimen Description BLOOD RIGHT FOREARM    Special Requests      BOTTLES DRAWN AEROBIC AND ANAEROBIC Blood Culture results may not be optimal due to an excessive volume of blood received in culture bottles   Culture      NO GROWTH < 12 HOURS Performed at [facility], [place] [address] [place], [place] [address]    Report Status PENDING   Lactic acid, plasma     Status: None   Collection Time: 04/06/22  3:14 AM  Result Value Ref Range   Lactic Acid, Venous 0.9 0.5 - 1.9 mmol/L    Comment: Performed at [facility] [address], [place], [place] [address]  Culture, blood (routine x 2)     Status: None (Preliminary result)   Collection Time: 04/06/22  3:14 AM   Specimen: BLOOD  Result Value Ref Range   Specimen Description BLOOD RIGHT ANTECUBITAL    Special Requests      BOTTLES DRAWN AEROBIC AND ANAEROBIC Blood Culture results may not be optimal due to an excessive volume of blood received in culture bottles   Culture      NO GROWTH < 12 HOURS Performed at [facility] [address] [place], [place] [address]    Report Status PENDING   Troponin I  (High Sensitivity)     Status: Abnormal   Collection Time: 04/06/22  4:09 AM  Result Value Ref Range   Troponin I (High Sensitivity) 33 (H) <18 ng/L    Comment: (NOTE) Elevated high sensitivity troponin I (hsTnI) values and significant  changes across serial measurements may suggest ACS but many other  chronic and acute conditions are known to elevate hsTnI results.  Refer to the "Links" section for chest pain algorithms and additional  guidance. Performed at [facility], [place] [address] [place], [place] [address]   CBC     Status: Abnormal   Collection Time: 04/06/22  8:00 AM  Result Value Ref Range   WBC 4.7 4.0 - 10.5 K/uL   RBC 2.27 (L) 4.22 - 5.81 MIL/uL   Hemoglobin 7.8 (L) 13.0 - 17.0 g/dL   HCT 23.2 (L) 39.0 - 52.0 %   MCV 102.2 (H) 80.0 - 100.0 fL   MCH 34.4 (H) 26.0 - 34.0 pg   MCHC 33.6 30.0 - 36.0 g/dL   RDW 18.3 (H) 11.5 - 15.5 %   Platelets 126 (L) 150 - 400 K/uL    Comment: REPEATED TO VERIFY   nRBC 0.0 0.0 - 0.2 %    Comment: Performed at [facility], [place] [address], [place], [place] [address]  Basic metabolic panel     Status: Abnormal   Collection Time: 04/06/22  8:00 AM  Result Value Ref Range   Sodium 127 (L) 135 - 145 mmol/L   Potassium 5.5 (H) 3.5 - 5.1 mmol/L   Chloride 91 (L) 98 - 111 mmol/L   CO2 23 22 - 32 mmol/L   Glucose, Bld 90 70 - 99 mg/dL    Comment: Glucose reference range applies only to samples taken after fasting for at least 8 hours.   BUN 54 (H) 8 - 23 mg/dL   Creatinine, Ser 10.02 (H) 0.61 - 1.24 mg/dL   Calcium 8.6 (L) 8.9 - 10.3 mg/dL   GFR, Estimated 5 (L) >60 mL/min    Comment: (NOTE) Calculated using the CKD-EPI Creatinine Equation (2021)    Anion gap 13 5 - 15    Comment: Performed at [facility] [address] [place], [place] [address]  CBG monitoring, ED     Status: None   Collection Time: 04/06/22  8:43 AM  Result Value Ref Range   Glucose-Capillary 87 70 - 99 mg/dL    Comment: Glucose reference  range applies only to samples taken after fasting for at least 8 hours.  CBG monitoring, ED     Status: None   Collection Time: 04/06/22 12:15 PM  Result Value Ref Range   Glucose-Capillary 86 70 - 99 mg/dL    Comment: Glucose reference range applies only to samples taken after fasting for at least 8 hours.   Comment 1 Notify RN  Comment 2 Document in Chart   CBG monitoring, ED     Status: None   Collection Time: 04/06/22  2:24 PM  Result Value Ref Range   Glucose-Capillary 95 70 - 99 mg/dL    Comment: Glucose reference range applies only to samples taken after fasting for at least 8 hours.   DG Chest Port 1 View  Result Date: 04/06/2022 CLINICAL DATA:  Chest pain EXAM: PORTABLE CHEST 1 VIEW COMPARISON:  04/03/2022 FINDINGS: Lung volumes are small, however, pulmonary insufflation remain stable since prior examination. Small bilateral pleural effusions are present with bibasilar compressive atelectasis. Multifocal airspace infiltrates, best appreciated at the right lung base and within the peripheral left mid lung zone, appears stable. No pneumothorax. Mild cardiomegaly is stable. No acute bone abnormality. Vascular stent graft noted within the left axilla. IMPRESSION: 1. Stable pulmonary insufflation. 2. Small bilateral pleural effusions with bibasilar compressive atelectasis. 3. Stable multifocal pulmonary infiltrates. Electronically Signed   By: Fidela Salisbury M.D.   On: 04/06/2022 02:04    Pending Labs Unresulted Labs (From admission, onward)     Start     Ordered   04/07/22 0500  Comprehensive metabolic panel  Tomorrow morning,   R        04/06/22 0644   04/07/22 0500  CBC  Tomorrow morning,   R        04/06/22 0644   04/06/22 1208  Hepatitis B surface antigen  (New Admission [facility] (Hepatitis B))  Once,   R        04/06/22 1208   04/06/22 1208  Hepatitis B surface antibody,quantitative  (New Ad[facility] (Hepatitis B))  Once,   R        04/06/22 1208   04/06/22 0643   Hemoglobin A1c  (Glycemic Control (SSI)  Q 4 Hours / Glycemic Control (SSI)  AC +/- HS)  Once,   R       Comments: To assess prior glycemic control    04/06/22 0644            Vitals/Pain Today's Vitals   04/06/22 1015 04/06/22 1023 04/06/22 1217 04/06/22 1300  BP: (!) 155/99  (!) 155/88 (!) 197/108  Pulse: 83  87 88  Resp: (!) 23  (!) 22 19  Temp:   97.7 F (36.5 C)   TempSrc:   Oral   SpO2: 100%  98% 97%  Weight:      Height:      PainSc:  10-Worst pain ever      Isolation Precautions No active isolations  Medications Medications  amLODipine (NORVASC) tablet 5 mg (5 mg Oral Given 04/06/22 1019)  insulin aspart (novoLOG) injection 0-6 Units ( Subcutaneous Not Given 04/06/22 1425)  heparin injection 5,000 Units (5,000 Units Subcutaneous Given 04/06/22 0851)  oxyCODONE (Oxy IR/ROXICODONE) immediate release tablet 5 mg (5 mg Oral Given 04/06/22 1025)  ondansetron (ZOFRAN) tablet 4 mg (has no administration in time range)    Or  ondansetron (ZOFRAN) injection 4 mg (has no administration in time range)  albuterol (PROVENTIL) (2.5 MG/3ML) 0.083% nebulizer solution 2.5 mg (has no administration in time range)  nicotine (NICODERM CQ - dosed in mg/24 hours) patch 21 mg (21 mg Transdermal Patch Applied 04/06/22 1021)  lidocaine (LIDODERM) 5 % 1 patch (1 patch Transdermal Patch Applied 04/06/22 0851)  acetaminophen (TYLENOL) tablet 650 mg (has no administration in time range)  diclofenac Sodium (VOLTAREN) 1 % topical gel 2 g (2 g Topical Given 04/06/22 1022)  camphor-menthol (SARNA) lotion (has no administration in time range)  sodium zirconium cyclosilicate (LOKELMA) packet 10 g (has no administration in time range)  fentaNYL (SUBLIMAZE) injection 50 mcg (50 mcg Intravenous Given 04/06/22 0223)    Mobility walks with device     Focused Assessments Pulmonary Assessment Handoff:  Lung sounds:   O2 Device: Nasal Cannula O2 Flow Rate (L/min): 2  L/min    R Recommendations: See Admitting Provider Note  Report given to:

## 2022-04-06 NOTE — ED Notes (Signed)
ED Provider at bedside. 

## 2022-04-06 NOTE — Progress Notes (Signed)
New Admission Note:   Arrival Method: Stretcher  Mental Orientation: alert and oriented x 4  Telemetry: Box 14  Assessment: Completed Skin: Intact IV: Right forearn  Pain: 0/10  Tubes: None Safety Measures: Safety Fall Prevention Plan has been discussed  Admission:  [facility] Orientation: Patient has been orientated to the room, unit and staff.   Family: none   Orders to be reviewed and implemented. Will continue to monitor the patient. Call light has been placed within reach and bed alarm has been activated.   Dillon Bjork, RN [facility]  Phone: [phone]
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
## 2022-04-06 NOTE — ED Triage Notes (Signed)
BIB EMS from home for right leg swelling x 3 days and SOB x 1 day. Last dialysis Thursday.  85%RA -> 97%[place] 4L

## 2022-04-06 NOTE — ED Provider Notes (Signed)
[facility] EMERGENCY DEPARTMENT Provider Note   CSN: [id number] Arrival date & time: 04/06/22  0130     History  Chief Complaint  Patient presents with   Shortness of Breath    Daniel [name] is a 61 y.o. male.  The history is provided by the patient, medical records and the EMS personnel.  Daniel [name] is a 61 y.o. male who presents to the Emergency Department complaining of chest pain.  He presents to the emergency department for evaluation of left-sided chest pain that started tonight.  He has a history of ESRD and dialyzes Tuesday, Thursday, Saturday and he missed his Saturday session due to his car being in the shop.  He reports 2 days of progressive bilateral lower extremity edema with cough productive of white sputum today.  No reports of fevers.  He has sharp left-sided chest pain, worse with coughing.  No vomiting, diarrhea.   EMS reports sats of 85% on room air.  Home Medications Prior to Admission medications   Medication Sig Start Date End Date Taking? Authorizing Provider  amLODipine (NORVASC) 5 MG tablet Take 1 tablet (5 mg total) by mouth daily. 04/01/22 06/30/22  Charlott Rakes, MD  atorvastatin (LIPITOR) 40 MG tablet Take 1 tablet (40 mg total) by mouth daily. 04/01/22 06/30/22  Charlott Rakes, MD  Calcium Carbonate Antacid (CALCIUM CARBONATE, DOSED IN MG ELEMENTAL CALCIUM,) 1250 MG/5ML SUSP Take 5 mLs (500 mg of elemental calcium total) by mouth every 6 (six) hours as needed for indigestion. 03/29/22   Charlynne Cousins, MD  gabapentin (NEURONTIN) 300 MG capsule Take 300 mg by mouth 3 (three) times daily.    [provider]  loperamide (IMODIUM) 2 MG capsule Take 1 capsule (2 mg total) by mouth 4 (four) times daily as needed for diarrhea or loose stools. 01/31/22   Carmin Muskrat, MD  pantoprazole (PROTONIX) 40 MG tablet Take 40 mg by mouth 2 (two) times daily before a meal.    [provider]  traZODone (DESYREL) 50 MG  tablet Take 1 tablet (50 mg total) by mouth daily as needed for sleep. 02/10/22   [name], NP  colchicine 0.6 MG tablet Take 0.5 tablets (0.3 mg total) by mouth 2 (two) times daily. 07/24/20 07/29/20  Noemi Chapel, MD  furosemide (LASIX) 40 MG tablet Take 1 tablet (40 mg total) by mouth daily. 07/25/19 02/09/20  Ladona Horns, MD      Allergies    Seroquel [quetiapine] and Dilaudid [hydromorphone hcl]    Review of Systems   Review of Systems  All other systems reviewed and are negative.   Physical Exam Updated Vital Signs BP 130/63   Pulse 93   Temp 98.3 F (36.8 C) (Oral)   Resp (!) 21   Ht '5\' 11"'$  (1.803 m)   Wt 95.3 kg   SpO2 96%   BMI 29.29 kg/m  Physical Exam Vitals and nursing note reviewed.  Constitutional:      Appearance: He is well-developed.     Comments: Chronically ill-appearing  HENT:     Head: Normocephalic and atraumatic.  Cardiovascular:     Rate and Rhythm: Normal rate and regular rhythm.     Heart sounds: No murmur heard. Pulmonary:     Effort: Pulmonary effort is normal. No respiratory distress.     Comments: Slightly decreased air movement in the bases bilaterally.  Frequent coughing. Abdominal:     Palpations: Abdomen is soft.     Tenderness: There  is no abdominal tenderness. There is no guarding or rebound.  Musculoskeletal:        General: No tenderness.     Comments: 3+ pitting edema to bilateral lower extremities.  Left lower extremity BKA.  Fistula in the left upper extremity with palpable thrill  Skin:    General: Skin is warm and dry.  Neurological:     Mental Status: He is alert and oriented to person, place, and time.  Psychiatric:        Behavior: Behavior normal.     ED Results / Procedures / Treatments   Labs (all labs ordered are listed, but only abnormal results are displayed) Labs Reviewed  COMPREHENSIVE METABOLIC PANEL - Abnormal; Notable for the following components:      Result Value   Sodium 128 (*)     Potassium 5.7 (*)    Chloride 91 (*)    Glucose, Bld 103 (*)    BUN 51 (*)    Creatinine, Ser 9.78 (*)    Calcium 8.6 (*)    Total Protein 8.7 (*)    Albumin 1.9 (*)    AST 195 (*)    ALT 56 (*)    Total Bilirubin 1.5 (*)    GFR, Estimated 6 (*)    All other components within normal limits  CBC WITH DIFFERENTIAL/PLATELET - Abnormal; Notable for the following components:   RBC 2.16 (*)    Hemoglobin 7.4 (*)    HCT 22.0 (*)    MCV 101.9 (*)    MCH 34.3 (*)    RDW 18.1 (*)    Platelets 132 (*)    All other components within normal limits  BRAIN NATRIURETIC PEPTIDE - Abnormal; Notable for the following components:   B Natriuretic Peptide 809.3 (*)    All other components within normal limits  TROPONIN I (HIGH SENSITIVITY) - Abnormal; Notable for the following components:   Troponin I (High Sensitivity) 32 (*)    All other components within normal limits  RESP PANEL BY RT-PCR (RSV, FLU A&B, COVID)  RVPGX2  CULTURE, BLOOD (ROUTINE X 2)  CULTURE, BLOOD (ROUTINE X 2)  LACTIC ACID, PLASMA  LACTIC ACID, PLASMA  TROPONIN I (HIGH SENSITIVITY)    EKG EKG Interpretation  Date/Time:  Sunday April 06 2022 02:01:52 EST Ventricular Rate:  92 PR Interval:  170 QRS Duration: 86 QT Interval:  368 QTC Calculation: 456 R Axis:   0 Text Interpretation: Sinus rhythm Anterolateral infarct, age indeterminate Confirmed by [name] [phone]) on 04/06/2022 3:00:17 AM  Radiology DG Chest Port 1 View  Result Date: 04/06/2022 CLINICAL DATA:  Chest pain EXAM: PORTABLE CHEST 1 VIEW COMPARISON:  04/03/2022 FINDINGS: Lung volumes are small, however, pulmonary insufflation remain stable since prior examination. Small bilateral pleural effusions are present with bibasilar compressive atelectasis. Multifocal airspace infiltrates, best appreciated at the right lung base and within the peripheral left mid lung zone, appears stable. No pneumothorax. Mild cardiomegaly is stable. No acute bone  abnormality. Vascular stent graft noted within the left axilla. IMPRESSION: 1. Stable pulmonary insufflation. 2. Small bilateral pleural effusions with bibasilar compressive atelectasis. 3. Stable multifocal pulmonary infiltrates. Electronically Signed   By: Fidela Salisbury M.D.   On: 04/06/2022 02:04    Procedures Procedures    Medications Ordered in ED Medications  acetaminophen (TYLENOL) tablet 650 mg (650 mg Oral Not Given 04/06/22 0209)  fentaNYL (SUBLIMAZE) injection 50 mcg (50 mcg Intravenous Given 04/06/22 0174)    ED Course/ Medical Decision Making/ A&P  Medical Decision Making Amount and/or Complexity of Data Reviewed Labs: ordered. Radiology: ordered.  Risk OTC drugs. Prescription drug management. Decision regarding hospitalization.   Patient was ESRD on hemodialysis here for evaluation of shortness of breath and left-sided chest pain.  EKG is without acute ischemic changes.  Initial troponin is 33, similar when compared to priors in the system.  He does have labs significant for hyponatremia and hyperkalemia to 5.7.  He appears volume overloaded on examination.  He does have a new oxygen requirement of 2 L to maintain sats in the low 90s.  Discussed with Dr. Moshe Cipro with nephrology-will see the patient in consult.  Medicine consulted for admission.        Final Clinical Impression(s) / ED Diagnoses Final diagnoses:  Hyperkalemia  Hypervolemia, unspecified hypervolemia type  ESRD (end stage renal disease) on dialysis [facility])  Precordial pain    Rx / DC Orders ED Discharge Orders     None         [name], MD 04/06/22 [phone]

## 2022-04-06 NOTE — ED Notes (Signed)
Renal at [facility]. PT alert, NAD, calm, interactive, resps e/u, assisted pt with changing TV channels per request.

## 2022-04-06 NOTE — Plan of Care (Signed)
  Problem: Education: Goal: Knowledge of General Education information will improve Description Including pain rating scale, medication(s)/side effects and non-pharmacologic comfort measures Outcome: Progressing   

## 2022-04-06 NOTE — ED Notes (Signed)
pt states "I need something stronger than tylenol". Refused Tylenol. Pt is complaining of worsening chest pain. EDP aware.

## 2022-04-06 NOTE — ED Notes (Signed)
Pt c/o itchy skin-- pharmacy messaged to please send lotion that is ordered

## 2022-04-06 NOTE — Consult Note (Signed)
[facility] Renal Consultation Note    Indication for Consultation:  Management of ESRD/hemodialysis, anemia, hypertension/volume, and secondary hyperparathyroidism. PCP:  HPI: Daniel [name] is a 61 y.o. male with ESRD, T2DM, Hep C cirrhosis, hepatocellular carcinoma s/p radiation, recurrent GIB, Hx ICH in 2019, PAD s/p L BKA who was admitted with dyspnea and volume overload.  Presented to ED on via EMS overnight with leg edema and dyspnea. Noted to be hypoxic on RA requiring O2. Labs with Na 128, Daniel 5.7 -> 5.5, CO2 25, BUN 51, Cr 9.7, Alb 1.9, Trop 32 -> 33, WBC 5.3, Hgb 7.4. CXR with pulm edema and B effusions. We were consulted for dialysis.  Seen in ED bed this AM - was sleeping soundly with O2 when I came in - able to wake easily. C/o CP, dyspnea, generalized pain. No N/V/D.  Dialyzes on MWF at [facility] - had recently transferred there from another [place] unit. He did have a full dialysis on 12/6 - met dry weight, before that may not have had HD in > 1 week.  Uses L AVF and he denies recent issues.  Past Medical History:  Diagnosis Date   Anemia    Diabetes mellitus without complication [facility])    patient denies   Dialysis patient [facility])    End stage chronic kidney disease ([place])    Hypertension    ICH (intracerebral hemorrhage) ([place]) 05/20/2017   PAD (peripheral artery disease) ([facility])    Shoulder pain, left 06/28/2013   Past Surgical History:  Procedure Laterality Date   A/V FISTULAGRAM N/A 08/15/2020   Procedure: A/V FISTULAGRAM - Left Upper;  Surgeon: Cherre Robins, MD;  Location: [place] CV LAB;  Service: Cardiovascular;  Laterality: N/A;   ABDOMINAL AORTOGRAM W/LOWER EXTREMITY N/A 04/08/2021   Procedure: ABDOMINAL AORTOGRAM W/LOWER EXTREMITY;  Surgeon: Waynetta Sandy, MD;  Location: [place] CV LAB;  Service: Cardiovascular;  Laterality: N/A;   AMPUTATION Left 04/16/2021   Procedure: LEFT BELOW KNEE AMPUTATION;  Surgeon:  Angelia Mould, MD;  Location: [place];  Service: Vascular;  Laterality: Left;   AMPUTATION Left 06/17/2021   Procedure: REVISION AMPUTATION BELOW KNEE;  Surgeon: Broadus John, MD;  Location: [place];  Service: Vascular;  Laterality: Left;   APPENDECTOMY     APPLICATION OF WOUND VAC Left 06/17/2021   Procedure: APPLICATION OF WOUND VAC;  Surgeon: Broadus John, MD;  Location: [place];  Service: Vascular;  Laterality: Left;   AV FISTULA PLACEMENT Left 08/03/2019   Procedure: LEFT ARM ARTERIOVENOUS (AV) CEPHALIC  FISTULA CREATION;  Surgeon: Waynetta Sandy, MD;  Location: [place];  Service: Vascular;  Laterality: Left;   BIOPSY  06/30/2019   Procedure: BIOPSY;  Surgeon: Ronald Lobo, MD;  Location: [facility] ENDOSCOPY;  Service: Endoscopy;;   BIOPSY  08/02/2019   Procedure: BIOPSY;  Surgeon: Yetta Flock, MD;  Location: [facility] ENDOSCOPY;  Service: Gastroenterology;;   BIOPSY  02/08/2021   Procedure: BIOPSY;  Surgeon: Sharyn Creamer, MD;  Location: [facility] ENDOSCOPY;  Service: Gastroenterology;;   BIOPSY  02/24/2021   Procedure: BIOPSY;  Surgeon: [name], MD;  Location: [facility] ENDOSCOPY;  Service: Gastroenterology;;   BIOPSY  03/26/2021   Procedure: BIOPSY;  Surgeon: Lavena Bullion, DO;  Location: [place];  Service: Gastroenterology;;   BIOPSY  08/06/2021   Procedure: BIOPSY;  Surgeon: [name], MD;  Location: [place];  Service: Gastroenterology;;   BIOPSY  09/15/2021   Procedure: BIOPSY;  Surgeon:  Sharyn Creamer, MD;  Location: [place];  Service: Gastroenterology;;   BRONCHIAL BIOPSY  12/26/2021   Procedure: BRONCHIAL BIOPSIES;  Surgeon: Juanito Doom, MD;  Location: [place];  Service: Cardiopulmonary;;   BRONCHIAL WASHINGS  12/26/2021   Procedure: BRONCHIAL WASHINGS;  Surgeon: Juanito Doom, MD;  Location: [place];  Service: Cardiopulmonary;;   COLONOSCOPY  01/23/2012   Procedure: COLONOSCOPY;  Surgeon: Danie Binder, MD;  [facility];  Service: Endoscopy;  Laterality: N/A;  11:10 AM   COLONOSCOPY WITH PROPOFOL N/A 06/30/2019   Procedure: COLONOSCOPY WITH PROPOFOL;  Surgeon: Ronald Lobo, MD;  Location: [place];  Service: Endoscopy;  Laterality: N/A;   ENTEROSCOPY N/A 08/02/2019   Procedure: ENTEROSCOPY;  Surgeon: Yetta Flock, MD;  Location: [facility] ENDOSCOPY;  Service: Gastroenterology;  Laterality: N/A;   ENTEROSCOPY N/A 02/24/2021   Procedure: ENTEROSCOPY;  Surgeon: [name], MD;  Location: [facility] ENDOSCOPY;  Service: Gastroenterology;  Laterality: N/A;   ENTEROSCOPY N/A 03/26/2021   Procedure: ENTEROSCOPY;  Surgeon: Lavena Bullion, DO;  Location: [place];  Service: Gastroenterology;  Laterality: N/A;   ENTEROSCOPY N/A 07/05/2021   Procedure: ENTEROSCOPY;  Surgeon: Carol Ada, MD;  Location: [place];  Service: Gastroenterology;  Laterality: N/A;   ENTEROSCOPY N/A 08/06/2021   Procedure: ENTEROSCOPY;  Surgeon: [name], MD;  Location: [facility] ENDOSCOPY;  Service: Gastroenterology;  Laterality: N/A;   ENTEROSCOPY N/A 08/24/2021   Procedure: ENTEROSCOPY;  Surgeon: Ladene Artist, MD;  Location: [facility] ENDOSCOPY;  Service: Gastroenterology;  Laterality: N/A;   ENTEROSCOPY N/A 09/15/2021   Procedure: ENTEROSCOPY;  Surgeon: Sharyn Creamer, MD;  Location: [facility] ENDOSCOPY;  Service: Gastroenterology;  Laterality: N/A;   ENTEROSCOPY N/A 10/06/2021   Procedure: ENTEROSCOPY;  Surgeon: [name], MD;  Location: [facility] ENDOSCOPY;  Service: Gastroenterology;  Laterality: N/A;   ENTEROSCOPY N/A 11/14/2021   Procedure: ENTEROSCOPY;  Surgeon: Doran Stabler, MD;  Location: [facility] ENDOSCOPY;  Service: Gastroenterology;  Laterality: N/A;   ESOPHAGOGASTRODUODENOSCOPY N/A 08/10/2020   Procedure: ESOPHAGOGASTRODUODENOSCOPY (EGD);  Surgeon: Jerene Bears, MD;  Location: [facility] ENDOSCOPY;  Service: Gastroenterology;  Laterality: N/A;   ESOPHAGOGASTRODUODENOSCOPY (EGD) WITH PROPOFOL N/A 06/30/2019    Procedure: ESOPHAGOGASTRODUODENOSCOPY (EGD) WITH PROPOFOL;  Surgeon: Ronald Lobo, MD;  Location: [place];  Service: Endoscopy;  Laterality: N/A;   ESOPHAGOGASTRODUODENOSCOPY (EGD) WITH PROPOFOL N/A 01/12/2021   Procedure: ESOPHAGOGASTRODUODENOSCOPY (EGD) WITH PROPOFOL;  Surgeon: Sharyn Creamer, MD;  Location: [place];  Service: Gastroenterology;  Laterality: N/A;   ESOPHAGOGASTRODUODENOSCOPY (EGD) WITH PROPOFOL N/A 02/08/2021   Procedure: ESOPHAGOGASTRODUODENOSCOPY (EGD) WITH PROPOFOL;  Surgeon: Sharyn Creamer, MD;  Location: [place];  Service: Gastroenterology;  Laterality: N/A;   FISTULA SUPERFICIALIZATION Left 10/17/2019   Procedure: LEFT UPPER EXTREMITY FISTULA REVISION, SIDE BRANCH LIGATION,  AND SUPERFICIALIZATION;  Surgeon: Marty Heck, MD;  Location: [place];  Service: Vascular;  Laterality: Left;   FISTULA SUPERFICIALIZATION Left 09/47/0962   Procedure: PLICATION OF ANEURYSM LEFT ARTERIOVENOUS FISTULA;  Surgeon: Angelia Mould, MD;  Location: [place];  Service: Vascular;  Laterality: Left;   FLEXIBLE SIGMOIDOSCOPY N/A 07/05/2021   Procedure: FLEXIBLE SIGMOIDOSCOPY;  Surgeon: Carol Ada, MD;  Location: [place];  Service: Gastroenterology;  Laterality: N/A;   GIVENS CAPSULE STUDY N/A 06/30/2019   Procedure: GIVENS CAPSULE STUDY;  Surgeon: Ronald Lobo, MD;  Location: [place];  Service: Endoscopy;  Laterality: N/A;   GIVENS CAPSULE STUDY N/A 08/25/2021   Procedure: GIVENS CAPSULE STUDY;  Surgeon: Ladene Artist,  MD;  Location: [place];  Service: Gastroenterology;  Laterality: N/A;   GIVENS CAPSULE STUDY N/A 10/06/2021   Procedure: GIVENS CAPSULE STUDY;  Surgeon: [name], MD;  Location: [place];  Service: Gastroenterology;  Laterality: N/A;   HEMOSTASIS CLIP PLACEMENT  08/10/2020   Procedure: HEMOSTASIS CLIP PLACEMENT;  Surgeon: Jerene Bears, MD;  Location: [place];  Service: Gastroenterology;;   HEMOSTASIS CLIP PLACEMENT   01/12/2021   Procedure: HEMOSTASIS CLIP PLACEMENT;  Surgeon: Sharyn Creamer, MD;  Location: [place];  Service: Gastroenterology;;   HEMOSTASIS CONTROL  08/02/2019   Procedure: HEMOSTASIS CONTROL;  Surgeon: Yetta Flock, MD;  Location: [place];  Service: Gastroenterology;;   HOT HEMOSTASIS  02/24/2021   Procedure: HOT HEMOSTASIS (ARGON PLASMA COAGULATION/BICAP);  Surgeon: [name], MD;  Location: [facility] ENDOSCOPY;  Service: Gastroenterology;;   HOT HEMOSTASIS N/A 03/26/2021   Procedure: HOT HEMOSTASIS (ARGON PLASMA COAGULATION/BICAP);  Surgeon: Lavena Bullion, DO;  Location: [facility] ENDOSCOPY;  Service: Gastroenterology;  Laterality: N/A;   HOT HEMOSTASIS N/A 07/05/2021   Procedure: HOT HEMOSTASIS (ARGON PLASMA COAGULATION/BICAP);  Surgeon: Carol Ada, MD;  Location: [place];  Service: Gastroenterology;  Laterality: N/A;   HOT HEMOSTASIS N/A 09/15/2021   Procedure: HOT HEMOSTASIS (ARGON PLASMA COAGULATION/BICAP);  Surgeon: Sharyn Creamer, MD;  Location: [place];  Service: Gastroenterology;  Laterality: N/A;   HOT HEMOSTASIS N/A 10/06/2021   Procedure: HOT HEMOSTASIS (ARGON PLASMA COAGULATION/BICAP);  Surgeon: [name], MD;  Location: [place];  Service: Gastroenterology;  Laterality: N/A;   INCISION AND DRAINAGE ABSCESS N/A 06/29/2016   Procedure: INCISION AND DRAINAGE ABDOMINAL WALL ABSCESS;  Surgeon: Alphonsa Overall, MD;  Location: [facility];  Service: General;  Laterality: N/A;   INSERTION OF DIALYSIS CATHETER Right 08/03/2019   Procedure: INSERTION OF DIALYSIS CATHETER;  Surgeon: Waynetta Sandy, MD;  Location: [place];  Service: Vascular;  Laterality: Right;   INSERTION OF DIALYSIS CATHETER Right 10/22/2019   Procedure: INSERTION OF 23CM TUNNELED DIALYSIS CATHETER RIGHT INTERNAL JUGULAR;  Surgeon: Angelia Mould, MD;  Location: [place];  Service: Vascular;  Laterality: Right;   INSERTION OF DIALYSIS CATHETER Right 08/12/2020   Procedure:  INSERTION OF Right internal Jugular TUNNELED  DIALYSIS CATHETER.;  Surgeon: Waynetta Sandy, MD;  Location: [place];  Service: Vascular;  Laterality: Right;   INSERTION OF DIALYSIS CATHETER N/A 04/19/2021   Procedure: INSERTION OF TUNNELED DIALYSIS CATHETER;  Surgeon: Angelia Mould, MD;  Location: [place];  Service: Vascular;  Laterality: N/A;   IR 3D INDEPENDENT WKST  11/07/2021   IR ANGIOGRAM SELECTIVE EACH ADDITIONAL VESSEL  11/07/2021   IR ANGIOGRAM SELECTIVE EACH ADDITIONAL VESSEL  11/07/2021   IR ANGIOGRAM SELECTIVE EACH ADDITIONAL VESSEL  11/07/2021   IR ANGIOGRAM SELECTIVE EACH ADDITIONAL VESSEL  11/28/2021   IR ANGIOGRAM VISCERAL SELECTIVE  11/07/2021   IR ANGIOGRAM VISCERAL SELECTIVE  11/28/2021   IR EMBO ARTERIAL NOT HEMORR HEMANG INC GUIDE ROADMAPPING  11/07/2021   IR EMBO TUMOR ORGAN ISCHEMIA INFARCT INC GUIDE ROADMAPPING  11/28/2021   IR PARACENTESIS  02/20/2022   IR RADIOLOGIST EVAL & MGMT  09/12/2021   IR US GUIDE VASC ACCESS LEFT  11/07/2021   IR US GUIDE VASC ACCESS LEFT  11/28/2021   Left heel surgery     LOWER EXTREMITY ANGIOGRAPHY N/A 07/08/2021   Procedure: Lower Extremity Angiography;  Surgeon: Cherre Robins, MD;  Location: [place] CV LAB;  Service: Cardiovascular;  Laterality: N/A;   PENILE BIOPSY N/A 03/26/2020  Procedure: PENILE ULCER DEBRIDEMENT;  Surgeon: Remi Haggard, MD;  Location: [facility];  Service: Urology;  Laterality: N/A;  30 MINS   PERIPHERAL VASCULAR INTERVENTION Right 07/08/2021   Procedure: PERIPHERAL VASCULAR INTERVENTION;  Surgeon: Cherre Robins, MD;  Location: [place] CV LAB;  Service: Cardiovascular;  Laterality: Right;   SCLEROTHERAPY  01/12/2021   Procedure: SCLEROTHERAPY;  Surgeon: Sharyn Creamer, MD;  Location: [facility] ENDOSCOPY;  Service: Gastroenterology;;   Clide Deutscher  02/24/2021   Procedure: Clide Deutscher;  Surgeon: [name], MD;  Location: [place];  Service: Gastroenterology;;   SUBMUCOSAL TATTOO INJECTION   08/24/2021   Procedure: SUBMUCOSAL TATTOO INJECTION;  Surgeon: Ladene Artist, MD;  Location: [place];  Service: Gastroenterology;;   SUBMUCOSAL TATTOO INJECTION  09/15/2021   Procedure: SUBMUCOSAL TATTOO INJECTION;  Surgeon: Sharyn Creamer, MD;  Location: [place];  Service: Gastroenterology;;   VIDEO BRONCHOSCOPY N/A 12/26/2021   Procedure: VIDEO BRONCHOSCOPY WITH FLUORO;  Surgeon: Juanito Doom, MD;  Location: [place];  Service: Cardiopulmonary;  Laterality: N/A;   Family History  Problem Relation Age of Onset   Colon cancer Neg Hx    Social History:  reports that he has been smoking cigarettes. He has a 22.50 pack-year smoking history. He has never used smokeless tobacco. He reports that he does not currently use alcohol. He reports that he does not currently use drugs after having used the following drugs: "Crack" cocaine.  ROS: As per HPI otherwise negative.  Physical Exam: Vitals:   04/06/22 0512 04/06/22 0845 04/06/22 1000 04/06/22 1015  BP: (!) 166/82 (!) 151/91 (!) 183/110 (!) 155/99  Pulse: 87 85 87 83  Resp: (!) _0 (!) 23  Temp:  (!) 97.5 F (36.4 C)    TempSrc:      SpO2: 92% 91% 100% 100%  Weight:      Height:         General: Well developed man, NAD. Nasal O2 4L/min in place. Head: Normocephalic, atraumatic, sclera non-icteric, mucus membranes are moist. Neck: Supple without lymphadenopathy/masses.  Lungs: Bibasilar rales present, clear in upper lobes Heart: RRR with normal S1, S2. No murmurs, rubs, or gallops appreciated. Abdomen: Soft, non-tender, non-distended with normoactive bowel sounds.  Musculoskeletal:  Strength and tone appear normal for age. Lower extremities: 3-4+ RLE and L stump edema - up through hips Neuro: Alert and oriented X 3. Moves all extremities spontaneously. Psych:  Responds to questions appropriately with a normal affect. Dialysis Access: L AVF + bruit  Allergies  Allergen Reactions   Seroquel [Quetiapine]  Other (See Comments)    Tardive kinesia Dystonia    Dilaudid [Hydromorphone Hcl] Itching and Other (See Comments)    Pt reports itchiness after IM injection    Prior to Admission medications   Medication Sig Start Date End Date Taking? Authorizing Provider  amLODipine (NORVASC) 5 MG tablet Take 1 tablet (5 mg total) by mouth daily. 04/01/22 06/30/22  Charlott Rakes, MD  atorvastatin (LIPITOR) 40 MG tablet Take 1 tablet (40 mg total) by mouth daily. 04/01/22 06/30/22  Charlott Rakes, MD  Calcium Carbonate Antacid (CALCIUM CARBONATE, DOSED IN MG ELEMENTAL CALCIUM,) 1250 MG/5ML SUSP Take 5 mLs (500 mg of elemental calcium total) by mouth every 6 (six) hours as needed for indigestion. 03/29/22   Charlynne Cousins, MD  gabapentin (NEURONTIN) 300 MG capsule Take 300 mg by mouth 3 (three) times daily.    [provider]  loperamide (IMODIUM) 2 MG capsule Take 1 capsule (  2 mg total) by mouth 4 (four) times daily as needed for diarrhea or loose stools. 01/31/22   Carmin Muskrat, MD  pantoprazole (PROTONIX) 40 MG tablet Take 40 mg by mouth 2 (two) times daily before a meal.    [provider]  traZODone (DESYREL) 50 MG tablet Take 1 tablet (50 mg total) by mouth daily as needed for sleep. 02/10/22   [name], NP  colchicine 0.6 MG tablet Take 0.5 tablets (0.3 mg total) by mouth 2 (two) times daily. 07/24/20 07/29/20  Noemi Chapel, MD  furosemide (LASIX) 40 MG tablet Take 1 tablet (40 mg total) by mouth daily. 07/25/19 02/09/20  Ladona Horns, MD   Current Facility-Administered Medications  Medication Dose Route Frequency Provider Last Rate Last Admin   acetaminophen (TYLENOL) tablet 650 mg  650 mg Oral Q4H PRN Eliseo Squires, Jessica U, DO       albuterol (PROVENTIL) (2.5 MG/3ML) 0.083% nebulizer solution 2.5 mg  2.5 mg Nebulization Q2H PRN Clance Boll, MD       amLODipine (NORVASC) tablet 5 mg  5 mg Oral Daily Myles Rosenthal A, MD   5 mg at 04/06/22 1019   camphor-menthol  (SARNA) lotion   Topical PRN Eulogio Bear U, DO       diclofenac Sodium (VOLTAREN) 1 % topical gel 2 g  2 g Topical QID Vann, Jessica U, DO   2 g at 04/06/22 1022   heparin injection 5,000 Units  5,000 Units Subcutaneous Q8H Myles Rosenthal A, MD   5,000 Units at 04/06/22 0851   insulin aspart (novoLOG) injection 0-6 Units  0-6 Units Subcutaneous TID WC Myles Rosenthal A, MD       lidocaine (LIDODERM) 5 % 1 patch  1 patch Transdermal Q24H Clance Boll, MD   1 patch at 04/06/22 0851   nicotine (NICODERM CQ - dosed in mg/24 hours) patch 21 mg  21 mg Transdermal Daily Myles Rosenthal A, MD   21 mg at 04/06/22 1021   ondansetron (ZOFRAN) tablet 4 mg  4 mg Oral Q6H PRN Clance Boll, MD       Or   ondansetron [facility]) injection 4 mg  4 mg Intravenous Q6H PRN Clance Boll, MD       oxyCODONE (Oxy IR/ROXICODONE) immediate release tablet 5 mg  5 mg Oral Q6H PRN Clance Boll, MD   5 mg at 04/06/22 1025   Current Outpatient Medications  Medication Sig Dispense Refill   amLODipine (NORVASC) 5 MG tablet Take 1 tablet (5 mg total) by mouth daily. 30 tablet 2   atorvastatin (LIPITOR) 40 MG tablet Take 1 tablet (40 mg total) by mouth daily. 30 tablet 2   Calcium Carbonate Antacid (CALCIUM CARBONATE, DOSED IN MG ELEMENTAL CALCIUM,) 1250 MG/5ML SUSP Take 5 mLs (500 mg of elemental calcium total) by mouth every 6 (six) hours as needed for indigestion. 450 mL 0   gabapentin (NEURONTIN) 300 MG capsule Take 300 mg by mouth 3 (three) times daily.     loperamide (IMODIUM) 2 MG capsule Take 1 capsule (2 mg total) by mouth 4 (four) times daily as needed for diarrhea or loose stools. 24 capsule 0   pantoprazole (PROTONIX) 40 MG tablet Take 40 mg by mouth 2 (two) times daily before a meal.     traZODone (DESYREL) 50 MG tablet Take 1 tablet (50 mg total) by mouth daily as needed for sleep. 90 tablet 0   Labs: Basic Metabolic Panel: Recent Labs  Lab  04/03/22 0432 04/06/22 0156  04/06/22 0800  NA 132* 128* 127*  Daniel 4.4 5.7* 5.5*  CL 94* 91* 91*  CO2 _0 GLUCOSE 107* 103* 90  BUN 28* 51* 54*  CREATININE 6.33* 9.78* 10.02*  CALCIUM 8.2* 8.6* 8.6*   Liver Function Tests: Recent Labs  Lab 04/06/22 0156  AST 195*  ALT 56*  ALKPHOS 105  BILITOT 1.5*  PROT 8.7*  ALBUMIN 1.9*   CBC: Recent Labs  Lab 04/03/22 0432 04/06/22 0156 04/06/22 0800  WBC 4.7 5.3 4.7  NEUTROABS 2.8 3.4  --   HGB 7.6* 7.4* 7.8*  HCT 23.4* 22.0* 23.2*  MCV 103.5* 101.9* 102.2*  PLT 122* 132* 126*   Studies/Results: DG Chest Port 1 View  Result Date: 04/06/2022 CLINICAL DATA:  Chest pain EXAM: PORTABLE CHEST 1 VIEW COMPARISON:  04/03/2022 FINDINGS: Lung volumes are small, however, pulmonary insufflation remain stable since prior examination. Small bilateral pleural effusions are present with bibasilar compressive atelectasis. Multifocal airspace infiltrates, best appreciated at the right lung base and within the peripheral left mid lung zone, appears stable. No pneumothorax. Mild cardiomegaly is stable. No acute bone abnormality. Vascular stent graft noted within the left axilla. IMPRESSION: 1. Stable pulmonary insufflation. 2. Small bilateral pleural effusions with bibasilar compressive atelectasis. 3. Stable multifocal pulmonary infiltrates. Electronically Signed   By: Fidela Salisbury M.D.   On: 04/06/2022 02:04    Dialysis Orders:  MWF [place] 4hr, 400/A1.5, EDW 98.8kg, 2K/2Ca, AVF, no heparin - Hectoral 61mg IV q HD - Appears to get IV iron with oncology, no ESA d/t cancer  Assessment/Plan:  Dyspnea/pulm edema: Overloaded, likely needs dry weight lowered significantly Hyperkalemia: Will order Lokelma x 2 today to hold until HD.  ESRD: Usual MWF schedule -> due for dialysis but we do not have Sunday staff so trying to hold him until tomorrow morning unless status changes -> for HD 1st thing in AM, large UF goal.  Hypertension/volume: BP high, UF as tolerated.  Anemia: Hgb  7.8 - will disc with patient risks/benefits of ESA tomorrow.  Metabolic bone disease: Ca ok, Phos pending.  Nutrition: Alb very low with cirrhosis, adding supplemnts  Cirrhosis  hepatocellular carcinoma  Recurrent GIB - no bleeding at this time.  KVeneta Penton PA-C 04/06/2022, 11:50 AM  [employer]

## 2022-04-07 ENCOUNTER — Encounter: Payer: Self-pay | Admitting: Cardiovascular Disease

## 2022-04-07 DIAGNOSIS — E8770 Fluid overload, unspecified: Secondary | ICD-10-CM | POA: Diagnosis not present

## 2022-04-07 LAB — COMPREHENSIVE METABOLIC PANEL
ALT: 48 U/L — ABNORMAL HIGH (ref 0–44)
AST: 158 U/L — ABNORMAL HIGH (ref 15–41)
Albumin: 1.8 g/dL — ABNORMAL LOW (ref 3.5–5.0)
Alkaline Phosphatase: 105 U/L (ref 38–126)
Anion gap: 12 (ref 5–15)
BUN: 61 mg/dL — ABNORMAL HIGH (ref 8–23)
CO2: 24 mmol/L (ref 22–32)
Calcium: 8.4 mg/dL — ABNORMAL LOW (ref 8.9–10.3)
Chloride: 91 mmol/L — ABNORMAL LOW (ref 98–111)
Creatinine, Ser: 10.82 mg/dL — ABNORMAL HIGH (ref 0.61–1.24)
GFR, Estimated: 5 mL/min — ABNORMAL LOW (ref >60)
Glucose, Bld: 108 mg/dL — ABNORMAL HIGH (ref 70–99)
Potassium: 5.2 mmol/L — ABNORMAL HIGH (ref 3.5–5.1)
Sodium: 127 mmol/L — ABNORMAL LOW (ref 135–145)
Total Bilirubin: 1.1 mg/dL (ref 0.3–1.2)
Total Protein: 7.9 g/dL (ref 6.5–8.1)

## 2022-04-07 LAB — HEMOGLOBIN A1C
Hgb A1c MFr Bld: 5.1 % (ref 4.8–5.6)
Mean Plasma Glucose: 100 mg/dL

## 2022-04-07 LAB — CBC
HCT: 20.7 % — ABNORMAL LOW (ref 39.0–52.0)
Hemoglobin: 6.8 g/dL — CL (ref 13.0–17.0)
MCH: 34 pg (ref 26.0–34.0)
MCHC: 32.9 g/dL (ref 30.0–36.0)
MCV: 103.5 fL — ABNORMAL HIGH (ref 80.0–100.0)
Platelets: 118 10*3/uL — ABNORMAL LOW (ref 150–400)
RBC: 2 MIL/uL — ABNORMAL LOW (ref 4.22–5.81)
RDW: 17.8 % — ABNORMAL HIGH (ref 11.5–15.5)
WBC: 4.8 10*3/uL (ref 4.0–10.5)
nRBC: 0 % (ref 0.0–0.2)

## 2022-04-07 LAB — PREPARE RBC (CROSSMATCH)

## 2022-04-07 LAB — IRON AND TIBC
Iron: 135 ug/dL (ref 45–182)
Saturation Ratios: 57 % — ABNORMAL HIGH (ref 17.9–39.5)
TIBC: 235 ug/dL — ABNORMAL LOW (ref 250–450)
UIBC: 100 ug/dL

## 2022-04-07 LAB — GLUCOSE, CAPILLARY
Glucose-Capillary: 91 mg/dL (ref 70–99)
Glucose-Capillary: 104 mg/dL — ABNORMAL HIGH (ref 70–99)
Glucose-Capillary: 105 mg/dL — ABNORMAL HIGH (ref 70–99)

## 2022-04-07 MED ORDER — LABETALOL HCL 5 MG/ML IV SOLN: 10.0000 mg | INTRAVENOUS | Status: DC | PRN

## 2022-04-07 MED ORDER — PANTOPRAZOLE SODIUM 40 MG PO TBEC
40.0000 mg | DELAYED_RELEASE_TABLET | Freq: Two times a day (BID) | ORAL | Status: DC
  Administered 2022-04-07 – 2022-04-08 (×3): 40 mg via ORAL
  Filled 2022-04-07 (×3): qty 1

## 2022-04-07 MED ORDER — HYDRALAZINE HCL 20 MG/ML IJ SOLN
10.0000 mg | Freq: Once | INTRAMUSCULAR | Status: AC
  Administered 2022-04-07: 20 mg via INTRAVENOUS
  Filled 2022-04-07: qty 1

## 2022-04-07 MED ORDER — SODIUM CHLORIDE 0.9% IV SOLUTION: Freq: Once | INTRAVENOUS | Status: DC
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
## 2022-04-07 NOTE — Progress Notes (Signed)
Received the following lab values from the lab nurse: Hemoglobin 6.9. On call Dr. Ree Kida.

## 2022-04-07 NOTE — Progress Notes (Signed)
Received patient in bed to unit.  Alert and oriented.  Informed consent signed and in chart.   Treatment initiated: 2956 Treatment completed: 1300  Patient tolerated well.  Transported back to the room  Alert, without acute distress.  Hand-off given to patient's nurse.   Access used: AVF Access issues: NA  Total UF removed: 4300 ml Medication(s) given: Hydralazine '10mg'$  IVP Post HD VS: see above Post HD weight: 95.9kg   [facility]

## 2022-04-08 ENCOUNTER — Other Ambulatory Visit: Payer: Self-pay | Admitting: Hematology

## 2022-04-08 ENCOUNTER — Inpatient Hospital Stay: Payer: Medicare Other | Attending: Internal Medicine

## 2022-04-08 DIAGNOSIS — E8770 Fluid overload, unspecified: Secondary | ICD-10-CM | POA: Diagnosis not present

## 2022-04-08 LAB — BPAM RBC
Blood Product Expiration Date: 202401062359
ISSUE DATE / TIME: 202312110927
Unit Type and Rh: 7300

## 2022-04-08 LAB — TYPE AND SCREEN
ABO/RH(D): B POS
Antibody Screen: NEGATIVE
Unit division: 0

## 2022-04-08 LAB — BASIC METABOLIC PANEL
Anion gap: 9 (ref 5–15)
BUN: 32 mg/dL — ABNORMAL HIGH (ref 8–23)
CO2: 27 mmol/L (ref 22–32)
Calcium: 8.3 mg/dL — ABNORMAL LOW (ref 8.9–10.3)
Chloride: 94 mmol/L — ABNORMAL LOW (ref 98–111)
Creatinine, Ser: 6.39 mg/dL — ABNORMAL HIGH (ref 0.61–1.24)
GFR, Estimated: 9 mL/min — ABNORMAL LOW (ref >60)
Glucose, Bld: 99 mg/dL (ref 70–99)
Potassium: 4.1 mmol/L (ref 3.5–5.1)
Sodium: 130 mmol/L — ABNORMAL LOW (ref 135–145)

## 2022-04-08 LAB — CBC
HCT: 24.5 % — ABNORMAL LOW (ref 39.0–52.0)
Hemoglobin: 8 g/dL — ABNORMAL LOW (ref 13.0–17.0)
MCH: 32.7 pg (ref 26.0–34.0)
MCHC: 32.7 g/dL (ref 30.0–36.0)
MCV: 100 fL (ref 80.0–100.0)
Platelets: 121 10*3/uL — ABNORMAL LOW (ref 150–400)
RBC: 2.45 MIL/uL — ABNORMAL LOW (ref 4.22–5.81)
RDW: 19.3 % — ABNORMAL HIGH (ref 11.5–15.5)
WBC: 3.9 10*3/uL — ABNORMAL LOW (ref 4.0–10.5)
nRBC: 0 % (ref 0.0–0.2)

## 2022-04-08 LAB — GLUCOSE, CAPILLARY
Glucose-Capillary: 131 mg/dL — ABNORMAL HIGH (ref 70–99)
Glucose-Capillary: 92 mg/dL (ref 70–99)

## 2022-04-08 LAB — HEPATITIS B SURFACE ANTIBODY, QUANTITATIVE: Hep B S AB Quant (Post): 53.7 m[IU]/mL (ref >9.9)

## 2022-04-08 MED ORDER — DICLOFENAC SODIUM 1 % EX GEL: 2.0000 g | Freq: Four times a day (QID) | CUTANEOUS | 0 refills | Status: DC | PRN

## 2022-04-08 MED ORDER — NICOTINE 21 MG/24HR TD PT24: 21.0000 mg | MEDICATED_PATCH | Freq: Every day | TRANSDERMAL | 0 refills | Status: DC

## 2022-04-08 NOTE — Progress Notes (Signed)
DISCHARGE NOTE HOME Daniel [name] to be discharged Home per MD order. Discussed prescriptions and follow up appointments with the patient. Medication list explained in detail. Patient verbalized knowledge and understanding.  Skin clean, dry and intact without evidence of skin break down, no evidence of skin tears noted. IV catheter discontinued intact. Site without signs and symptoms of complications. Dressing and pressure applied. Pt denies pain at the site currently. No complaints noted.  An After Visit Summary (AVS) was printed and given to the patient. Patient escorted via wheelchair, and discharged home via private auto.  Virgina Jock, RN

## 2022-04-08 NOTE — Discharge Summary (Signed)
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 result may occur with  improper specimen collection/handling, submission of specimen other than nasopharyngeal swab, presence of viral mutation(s) within the areas targeted by this assay, and inadequate number of viral copies(<138 copies/mL). A negative result must be combined with clinical observations, patient history, and epidemiological information. The expected result is Negative.  Fact Sheet for Patients:  EntrepreneurPulse.com.au  Fact Sheet for Healthcare Providers:   IncredibleEmployment.be  This test is no t yet approved or cleared by the [place] FDA and  has been authorized for detection and/or diagnosis of SARS-CoV-2 by FDA under an Emergency Use Authorization (EUA). This EUA will remain  in effect (meaning this test can be used) for the duration of the COVID-19 declaration under Section 564(b)(1) of the Act, 21 U.S.C.section 360bbb-3(b)(1), unless the authorization is terminated  or revoked sooner.       Influenza A by PCR NEGATIVE NEGATIVE Final   Influenza B by PCR NEGATIVE NEGATIVE Final    Comment: (NOTE) The Xpert Xpress SARS-CoV-2/FLU/RSV plus assay is intended as an aid in the diagnosis of influenza from Nasopharyngeal swab specimens and should not be used as a sole basis for treatment. Nasal washings and aspirates are unacceptable for Xpert Xpress SARS-CoV-2/FLU/RSV testing.  Fact Sheet for Patients: EntrepreneurPulse.com.au  Fact Sheet for Healthcare Providers: IncredibleEmployment.be  This test is not yet approved or cleared by the [place] FDA and has been authorized for detection and/or diagnosis of SARS-CoV-2 by FDA under an Emergency Use Authorization (EUA). This EUA will remain in effect (meaning this test can be used) for the duration of the COVID-19 declaration under Section 564(b)(1) of the Act, 21 U.S.C. section 360bbb-3(b)(1), unless the authorization is terminated or revoked.  Performed at [facility], [place] [address] [place], [place] [address]   Resp panel by RT-PCR (RSV, Flu A&B, Covid) Anterior Nasal Swab     Status: None   Collection Time: 04/06/22  1:54 AM   Specimen: Anterior Nasal Swab  Result Value Ref Range Status   SARS Coronavirus 2 by RT PCR NEGATIVE NEGATIVE Final    Comment: (NOTE) SARS-CoV-2 target nucleic acids are NOT DETECTED.  The SARS-CoV-2 RNA is generally detectable in upper respiratory specimens during the acute  phase of infection. The lowest concentration of SARS-CoV-2 viral copies this assay can detect is 138 copies/mL. A negative result does not preclude SARS-Cov-2 infection and should not be used as the sole basis for treatment or other patient management decisions. A negative result may occur with  improper specimen collection/handling, submission of specimen other than nasopharyngeal swab, presence of viral mutation(s) within the areas targeted by this assay, and inadequate number of viral copies(<138 copies/mL). A negative result must be combined with clinical observations, patient history, and epidemiological information. The expected result is Negative.  Fact Sheet for Patients:  EntrepreneurPulse.com.au  Fact Sheet for Healthcare Providers:  IncredibleEmployment.be  This test is no t yet approved or cleared by the [place] FDA and  has been authorized for detection and/or diagnosis of SARS-CoV-2 by FDA under an Emergency Use Authorization (EUA). This EUA will remain  in effect (meaning this test can be used) for the duration of the COVID-19 declaration under Section 564(b)(1) of the Act, 21 U.S.C.section 360bbb-3(b)(1), unless the authorization is terminated  or revoked sooner.       Influenza A by PCR NEGATIVE NEGATIVE Final   Influenza B by PCR NEGATIVE NEGATIVE Final    Comment: (NOTE) The Xpert Xpress SARS-CoV-2/FLU/RSV plus assay is intended as an aid in the diagnosis of influenza from Nasopharyngeal swab specimens and should not be used as a sole basis for treatment. Nasal washings and aspirates are unacceptable for Xpert Xpress SARS-CoV-2/FLU/RSV testing.  Fact Sheet for Patients: EntrepreneurPulse.com.au  Fact Sheet for Healthcare Providers: IncredibleEmployment.be  This test is not yet approved or cleared by the [place] FDA and has been authorized for detection and/or diagnosis of  SARS-CoV-2 by FDA under an Emergency Use Authorization (EUA). This EUA will remain in effect (meaning this test can be used) for the duration of the COVID-19 declaration under Section 564(b)(1) of the Act, 21 U.S.C. section 360bbb-3(b)(1), unless the authorization is terminated or revoked.     Resp Syncytial Virus by PCR NEGATIVE NEGATIVE Final    Comment: (NOTE) Fact Sheet for Patients: EntrepreneurPulse.com.au  Fact Sheet for Healthcare Providers: IncredibleEmployment.be  This test is not yet approved or cleared by the [place] FDA and has been authorized for detection and/or diagnosis of SARS-CoV-2 by FDA under an Emergency Use Authorization (EUA). This EUA will remain in effect (meaning this test can be used) for the duration of the COVID-19 declaration under Section 564(b)(1) of the Act, 21 U.S.C. section 360bbb-3(b)(1), unless the authorization is terminated or revoked.  Performed at [facility], [place] [address], [place], [place] [address]   Culture, blood (routine x 2)     Status: None (Preliminary result)   Collection Time: 04/06/22  1:56 AM   Specimen: BLOOD RIGHT FOREARM  Result Value Ref Range Status   Specimen Description BLOOD RIGHT FOREARM  Final   Special Requests   Final    BOTTLES DRAWN AEROBIC AND ANAEROBIC Blood Culture results may not be optimal due to an excessive volume of blood received in culture bottles   Culture   Final    NO GROWTH 1 DAY Performed at [facility], [place] [address], [place], [place] [address]    Report Status PENDING  Incomplete  Culture, blood (routine x 2)     Status: None (Preliminary result)   Collection Time: 04/06/22  3:14 AM   Specimen: BLOOD  Result Value Ref Range Status   Specimen Description BLOOD RIGHT ANTECUBITAL  Final   Special Requests   Final    BOTTLES DRAWN AEROBIC AND ANAEROBIC Blood Culture results may not be optimal due to an excessive volume of blood  received in culture bottles   Culture   Final    NO GROWTH 1 DAY Performed at [facility], [place] [address], [place],  [address]    Report Status PENDING  Incomplete  MRSA Next Gen by PCR, Nasal     Status: None   Collection Time: 04/06/22  5:44 PM   Specimen: Nasal Mucosa; Nasal Swab  Result Value Ref Range Status   MRSA by PCR Next Gen NOT DETECTED NOT DETECTED Final    Comment: (NOTE) The GeneXpert MRSA Assay (FDA approved for NASAL specimens only), is one component of a comprehensive MRSA colonization surveillance program. It is not intended to diagnose MRSA infection nor to guide or monitor treatment for MRSA infections. Test performance is not FDA approved in patients less than 73 years old. Performed at [facility], [place] [address] [place],  [address]     Please note: You were cared for by a hospitalist during your hospital stay. Once you are discharged, your primary care physician will handle any further medical issues. Please note that NO REFILLS for any discharge medications will be authorized once you are discharged, as it is imperative that you return to your primary care physician (or establish a relationship with a primary care physician if you do not have one) for your post hospital discharge needs so that they can reassess your need for medications and monitor your lab values.    Time coordinating discharge: 40 minutes  SIGNED:   Shelly Coss, MD  [facility] 04/08/2022, 11:33 AM Pager [phone]  If 7PM-7AM, please contact night-coverage [email or web address] Password [email or web address]

## 2022-04-08 NOTE — Progress Notes (Addendum)
Advised by pt's RN that pt will likely leave hospital today. Staff requested that I make sure pt has appt tomorrow at out-pt clinic. Contacted [facility] to make them aware pt should resume care tomorrow and confirmed 11:10 chair time for tomorrow as well. Update provided to team via secure chat.   Melven Sartorius Renal Navigator [phone]  Addendum at 2:33 pm: Clinic advised pt did d/c today and should resume care tomorrow.

## 2022-04-08 NOTE — TOC Transition Note (Signed)
Transition of Care [facility]) - CM/SW Discharge Note   Patient Details  Name: Daniel [name] MRN: [id number] Date of Birth: 09-25-1960  Transition of Care [facility] CM/SW Contact:  Tom-Johnson, Renea Ee, RN Phone Number: 04/08/2022, 12:08 PM   Clinical Narrative:     Patient is scheduled for discharge today. [place] waiver form explained and given to patient to sign and signed copy placed in patient's chart. Cab voucher given to patient's nurse. No further TOC needs noted.     Final next level of care: Home/Self Care Barriers to Discharge: Barriers Resolved   Patient Goals and CMS Choice Patient states their goals for this hospitalization and ongoing recovery are:: To return home CMS Medicare.gov Compare Post Acute Care list provided to:: Patient Choice offered to / list presented to : NA  Discharge Placement                Patient to be transferred to facility by[facility]      Discharge Plan and Services                DME Arranged: N/A DME Agency: NA                  Social Determinants of Health (SDOH) Interventions Transportation Interventions: Inpatient TOC, Taxi Voucher Given   Readmission Risk Interventions    10/10/2021   10:49 AM 02/27/2021   12:51 PM  Readmission Risk Prevention Plan  Transportation Screening Complete Complete  Medication Review [employer]) Complete Complete  PCP or Specialist appointment within 3-5 days of discharge Complete Complete  HRI or [place] Complete Complete  SW Recovery Care/Counseling Consult Complete Complete  Palliative Care Screening Complete Not [place] Patient Refused Not Applicable
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
## 2022-04-09 ENCOUNTER — Telehealth: Payer: Self-pay | Admitting: *Deleted

## 2022-04-09 ENCOUNTER — Other Ambulatory Visit: Payer: Self-pay | Admitting: Interventional Radiology

## 2022-04-09 ENCOUNTER — Encounter: Payer: Self-pay | Admitting: *Deleted

## 2022-04-09 ENCOUNTER — Telehealth: Payer: Self-pay | Admitting: Physician Assistant

## 2022-04-09 ENCOUNTER — Telehealth: Payer: Self-pay

## 2022-04-09 ENCOUNTER — Inpatient Hospital Stay: Payer: Medicare Other

## 2022-04-09 DIAGNOSIS — C220 Liver cell carcinoma: Secondary | ICD-10-CM

## 2022-04-09 NOTE — Telephone Encounter (Signed)
Transition Care Management Unsuccessful Follow-up Telephone Call  Date of discharge and from where:  04/08/2022, [facility]   Attempts:  1st Attempt  Reason for unsuccessful TCM follow-up call:  Left voice message [phone], call back requested.

## 2022-04-09 NOTE — Telephone Encounter (Signed)
Transition of care contact from inpatient facility  Date of discharge: 04/08/22 Date of contact: 04/09/22 Method: Phone Spoke to: Patient  Patient contacted to discuss transition of care from recent inpatient hospitalization. Patient was admitted to [facility] with discharge diagnosis of hypervolemia/missed HD. He missed dialysis today, reports transportation issues. Advised to call clinic for spot tomorrow, ask to speak to their SW about transportation and come back to ED if any SOB.  Medication changes were reviewed.  Patient will follow up with his/her outpatient HD unit on: 04/10/22  Anice Paganini, PA-C 04/09/2022, 2:35 PM  [facility] Pager: [phone]

## 2022-04-10 ENCOUNTER — Emergency Department (HOSPITAL_COMMUNITY): Payer: Medicare Other

## 2022-04-10 ENCOUNTER — Ambulatory Visit: Payer: Medicare Other | Admitting: Physician Assistant

## 2022-04-10 ENCOUNTER — Encounter (HOSPITAL_COMMUNITY): Payer: Self-pay | Admitting: Emergency Medicine

## 2022-04-10 ENCOUNTER — Telehealth: Payer: Self-pay

## 2022-04-10 ENCOUNTER — Emergency Department (HOSPITAL_COMMUNITY)
Admission: EM | Admit: 2022-04-10 | Discharge: 2022-04-11 | Disposition: A | Discharge status: Home / Self Care | Payer: Medicare Other | Attending: Emergency Medicine | Admitting: Emergency Medicine

## 2022-04-10 DIAGNOSIS — Z79899 Other long term (current) drug therapy: Secondary | ICD-10-CM | POA: Insufficient documentation

## 2022-04-10 DIAGNOSIS — E1122 Type 2 diabetes mellitus with diabetic chronic kidney disease: Secondary | ICD-10-CM | POA: Diagnosis not present

## 2022-04-10 DIAGNOSIS — N186 End stage renal disease: Secondary | ICD-10-CM | POA: Diagnosis not present

## 2022-04-10 DIAGNOSIS — Z992 Dependence on renal dialysis: Secondary | ICD-10-CM | POA: Diagnosis not present

## 2022-04-10 DIAGNOSIS — R0602 Shortness of breath: Secondary | ICD-10-CM | POA: Insufficient documentation

## 2022-04-10 DIAGNOSIS — R0603 Acute respiratory distress: Secondary | ICD-10-CM | POA: Insufficient documentation

## 2022-04-10 DIAGNOSIS — M7989 Other specified soft tissue disorders: Secondary | ICD-10-CM | POA: Diagnosis not present

## 2022-04-10 DIAGNOSIS — I120 Hypertensive chronic kidney disease with stage 5 chronic kidney disease or end stage renal disease: Secondary | ICD-10-CM | POA: Insufficient documentation

## 2022-04-10 DIAGNOSIS — Z8505 Personal history of malignant neoplasm of liver: Secondary | ICD-10-CM | POA: Diagnosis not present

## 2022-04-10 LAB — CBC WITH DIFFERENTIAL/PLATELET
Abs Immature Granulocytes: 0.01 10*3/uL (ref 0.00–0.07)
Basophils Absolute: 0 10*3/uL (ref 0.0–0.1)
Basophils Relative: 1 %
Eosinophils Absolute: 0.1 10*3/uL (ref 0.0–0.5)
Eosinophils Relative: 3 %
HCT: 24.3 % — ABNORMAL LOW (ref 39.0–52.0)
Hemoglobin: 8.4 g/dL — ABNORMAL LOW (ref 13.0–17.0)
Immature Granulocytes: 0 %
Lymphocytes Relative: 20 %
Lymphs Abs: 0.8 10*3/uL (ref 0.7–4.0)
MCH: 34 pg (ref 26.0–34.0)
MCHC: 34.6 g/dL (ref 30.0–36.0)
MCV: 98.4 fL (ref 80.0–100.0)
Monocytes Absolute: 0.8 10*3/uL (ref 0.1–1.0)
Monocytes Relative: 20 %
Neutro Abs: 2.3 10*3/uL (ref 1.7–7.7)
Neutrophils Relative %: 56 %
Platelets: 131 10*3/uL — ABNORMAL LOW (ref 150–400)
RBC: 2.47 MIL/uL — ABNORMAL LOW (ref 4.22–5.81)
RDW: 18.6 % — ABNORMAL HIGH (ref 11.5–15.5)
WBC: 4 10*3/uL (ref 4.0–10.5)
nRBC: 0 % (ref 0.0–0.2)

## 2022-04-10 LAB — COMPREHENSIVE METABOLIC PANEL
ALT: 41 U/L (ref 0–44)
AST: 125 U/L — ABNORMAL HIGH (ref 15–41)
Albumin: 1.9 g/dL — ABNORMAL LOW (ref 3.5–5.0)
Alkaline Phosphatase: 101 U/L (ref 38–126)
Anion gap: 13 (ref 5–15)
BUN: 42 mg/dL — ABNORMAL HIGH (ref 8–23)
CO2: 23 mmol/L (ref 22–32)
Calcium: 8.4 mg/dL — ABNORMAL LOW (ref 8.9–10.3)
Chloride: 90 mmol/L — ABNORMAL LOW (ref 98–111)
Creatinine, Ser: 8.62 mg/dL — ABNORMAL HIGH (ref 0.61–1.24)
GFR, Estimated: 6 mL/min — ABNORMAL LOW (ref >60)
Glucose, Bld: 102 mg/dL — ABNORMAL HIGH (ref 70–99)
Potassium: 4.8 mmol/L (ref 3.5–5.1)
Sodium: 126 mmol/L — ABNORMAL LOW (ref 135–145)
Total Bilirubin: 1 mg/dL (ref 0.3–1.2)
Total Protein: 8.9 g/dL — ABNORMAL HIGH (ref 6.5–8.1)

## 2022-04-10 LAB — TROPONIN I (HIGH SENSITIVITY)
Troponin I (High Sensitivity): 21 ng/L — ABNORMAL HIGH (ref <18)
Troponin I (High Sensitivity): 21 ng/L — ABNORMAL HIGH (ref <18)

## 2022-04-10 LAB — BRAIN NATRIURETIC PEPTIDE: B Natriuretic Peptide: 562.5 pg/mL — ABNORMAL HIGH (ref 0.0–100.0)

## 2022-04-10 MED ORDER — CHLORHEXIDINE GLUCONATE CLOTH 2 % EX PADS: 6.0000 | MEDICATED_PAD | Freq: Every day | CUTANEOUS | Status: DC

## 2022-04-10 MED ORDER — HYDROMORPHONE HCL 1 MG/ML IJ SOLN
0.5000 mg | Freq: Once | INTRAMUSCULAR | Status: AC
  Administered 2022-04-10: 0.5 mg via INTRAVENOUS
  Filled 2022-04-10 (×2): qty 1
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
## 2022-04-10 NOTE — Telephone Encounter (Signed)
Transition Care Management Follow-up Telephone Call Date of discharge and from where: 04/08/2022, [facility]. He is currently in ED at [facility]

## 2022-04-10 NOTE — ED Triage Notes (Addendum)
Pt's last dialysis was Monday. Presents with SOB starting an hour ago. LBK amputee. Generalized edema. Diminished breath sounds. Mildly hypertensive. Tachypnea and belly breathing but talking without distress. States he missed dialysis bc he had something important to do.

## 2022-04-10 NOTE — ED Notes (Signed)
Upon entering room to administer pain medication patient noted to be sleepy and lethargic. EDP aware of patient status.

## 2022-04-10 NOTE — ED Provider Notes (Signed)
[facility] EMERGENCY DEPARTMENT Provider Note   CSN: [id number] Arrival date & time: 04/10/22  0242     History  Chief Complaint  Patient presents with   Shortness of Breath    Daniel [name] is a 61 y.o. 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   P/w SOB and leg swelling that started yesterday and are worsening. No associated chest pain. Pt's last dialysis was Monday. States he missed dialysis bc his car was in the shop. Endorses mild lower back pain.   Per chart review patient was recently mated from 12/10 to 04/08/2022 for acute [place] respiratory failure secondary to volume overload with small bilateral pleural effusions and multifocal pulmonary infiltrates that were stable likely from pulmonary edema.  Was noted to have chest pain due to uncontrolled hypertension and fluid overload with reassuring cardiac workup and pain that resolved.  Had missed dialysis at that time due to lack of transportation.   Shortness of Breath      Home Medications Prior to Admission medications   Medication Sig Start Date End Date Taking? Authorizing Provider  amLODipine (NORVASC) 5 MG tablet Take 1 tablet (5 mg total) by mouth daily. 04/01/22 06/30/22  Charlott Rakes, MD  atorvastatin (LIPITOR) 40 MG tablet Take 1 tablet (40 mg total) by mouth daily. 04/01/22 06/30/22  Charlott Rakes, MD  Calcium Carbonate Antacid (CALCIUM CARBONATE, DOSED IN MG ELEMENTAL CALCIUM,) 1250 MG/5ML SUSP Take 5 mLs (500 mg of elemental calcium total) by mouth every 6 (six) hours as needed for indigestion. 03/29/22   Charlynne Cousins, MD  diclofenac Sodium (VOLTAREN) 1 % GEL Apply 2 g topically every 6  (six) hours as needed (apply on painful areas). 04/08/22   Shelly Coss, MD  gabapentin (NEURONTIN) 300 MG capsule Take 300 mg by mouth 3 (three) times daily.    [provider]  loperamide (IMODIUM) 2 MG capsule Take 1 capsule (2 mg total) by mouth 4 (four) times daily as needed for diarrhea or loose stools. 01/31/22   Carmin Muskrat, MD  nicotine (NICODERM CQ - DOSED IN MG/24 HOURS) 21 mg/24hr patch Place 1 patch (21 mg total) onto the skin daily. 04/09/22   Shelly Coss, MD  pantoprazole (PROTONIX) 40 MG tablet Take 40 mg by mouth 2 (two) times daily before a meal.    [provider]  traZODone (DESYREL) 50 MG tablet Take 1 tablet (50 mg total) by mouth daily as needed for sleep. 02/10/22   [name], NP  colchicine 0.6 MG tablet Take 0.5 tablets (0.3 mg total) by mouth 2 (two) times daily. 07/24/20 07/29/20  Noemi Chapel, MD  furosemide (LASIX) 40 MG tablet Take 1 tablet (40 mg total) by mouth daily. 07/25/19 02/09/20  Ladona Horns, MD      Allergies    Seroquel [quetiapine] and Dilaudid [hydromorphone hcl]    Review of Systems   Review of Systems  Respiratory:  Positive for shortness of breath.    Review of systems Negative for f/c.  A 10 point review of systems was performed and is negative unless otherwise reported in HPI.  Physical Exam Updated Vital Signs BP (!) 154/95 (BP Location: Right Arm)   Pulse 85   Temp (!) 97.4 F (36.3 C)  Resp (!) 23   SpO2 98%  Physical Exam General: Chronically ill-appearing male in moderate respiratory distress, lying in bed.  HEENT: Sclera anicteric, MMM, trachea midline.  Cardiology: RRR, no murmurs/rubs/gallops. BL radial and DP pulses equal bilaterally.  Resp: Increased respiratory rate and effort, breathing every other word.  No wheezes or rales. Abd: Soft, non-tender, mildly distended. No rebound tenderness or guarding.  GU: Deferred. MSK: Status post left BKA.  3+ pitting edema bilaterally.  No signs of  trauma. Extremities without deformity or TTP. No cyanosis or clubbing. Skin: warm, dry. No rashes or lesions Neuro: A&Ox4, CNs II-XII grossly intact. MAEs. Sensation grossly intact.  Psych: Normal mood and affect.   ED Results / Procedures / Treatments   Labs (all labs ordered are listed, but only abnormal results are displayed) Labs Reviewed  COMPREHENSIVE METABOLIC PANEL - Abnormal; Notable for the following components:      Result Value   Sodium 126 (*)    Chloride 90 (*)    Glucose, Bld 102 (*)    BUN 42 (*)    Creatinine, Ser 8.62 (*)    Calcium 8.4 (*)    Total Protein 8.9 (*)    Albumin 1.9 (*)    AST 125 (*)    GFR, Estimated 6 (*)    All other components within normal limits  BRAIN NATRIURETIC PEPTIDE - Abnormal; Notable for the following components:   B Natriuretic Peptide 562.5 (*)    All other components within normal limits  CBC WITH DIFFERENTIAL/PLATELET - Abnormal; Notable for the following components:   RBC 2.47 (*)    Hemoglobin 8.4 (*)    HCT 24.3 (*)    RDW 18.6 (*)    Platelets 131 (*)    All other components within normal limits  TROPONIN I (HIGH SENSITIVITY) - Abnormal; Notable for the following components:   Troponin I (High Sensitivity) 21 (*)    All other components within normal limits  TROPONIN I (HIGH SENSITIVITY) - Abnormal; Notable for the following components:   Troponin I (High Sensitivity) 21 (*)    All other components within normal limits    EKG None  Radiology DG Chest 2 View  Result Date: 04/10/2022 CLINICAL DATA:  Shortness of breath EXAM: CHEST - 2 VIEW COMPARISON:  04/06/2022 FINDINGS: Cardiac shadow is enlarged. Aortic calcifications are seen. Bilateral pleural effusions are noted. Patchy airspace opacity is noted in both lungs primarily in the bases. No pneumothorax is seen. No bony abnormality is noted. IMPRESSION: Bilateral effusions and airspace opacities stable from the prior study. Electronically Signed   By: Inez Catalina  M.D.   On: 04/10/2022 03:56    Procedures .Critical Care  Performed by: Audley Hose, MD Authorized by: Audley Hose, MD   Critical care provider statement:    Critical care time (minutes):  45   Critical care was necessary to treat or prevent imminent or life-threatening deterioration of the following conditions:  Respiratory failure   Critical care was time spent personally by me on the following activities:  Development of treatment plan with patient or surrogate, discussions with consultants, evaluation of patient's response to treatment, examination of patient, ordering and review of laboratory studies, ordering and review of radiographic studies, ordering and performing treatments and interventions, pulse oximetry, re-evaluation of patient's condition, review of old charts, obtaining history from patient or surrogate and ventilator management     Medications Ordered in ED Medications  HYDROmorphone (DILAUDID) injection 0.5 mg (has no administration  in time range)    ED Course/ Medical Decision Making/ A&P                          Medical Decision Making Amount and/or Complexity of Data Reviewed Labs:  Decision-making details documented in ED Course. Radiology:  Decision-making details documented in ED Course.  Risk Prescription drug management.    This patient presents to the ED for concern of SOB, this involves an extensive number of treatment options, and is a complaint that carries with it a high risk of complications and morbidity.  I considered the following differential and admission for this acute, potentially life threatening condition. Placed on 2L [place] for 90% on RA an dincreased WOB  MDM:    Patient with moderate respiratory distress on 2L [place] with increased WOB. RT coming to put patient on BiPAP. Likely w/ volume overload in s/o missed dialysis w/ increased LEE. No wheezing on exam to suggest COPD. Increased cough could represent pulm edema, known pleural  effusions. No f/c to suggest PNA. Will also get flu/covid test. No CP to suggest ACS, will get EKG to r/o arrhythmia. Consider electrolyte abnormalities d/t missed dialysis, anemia. Considered PE though do not believe it is most likely diagnosis given volume overload and missed HD, no CP.   Clinical Course as of 04/10/22 1541  Thu Apr 10, 2022  1439 Sodium(!): 126 Chronic hyponatremia, BL 127-132 [HN]  1439 Creatinine(!): 8.62 C/w ESRD [HN]  1439 B Natriuretic Peptide(!): 562.5 [HN]  1439 Hemoglobin(!): 8.4 BL 7-9 [HN]  1439 Troponin I (High Sensitivity)(!): 21 21-21, BL ~20 [HN]  1440 DG Chest 2 View FINDINGS: Cardiac shadow is enlarged. Aortic calcifications are seen. Bilateral pleural effusions are noted. Patchy airspace opacity is noted in both lungs primarily in the bases. No pneumothorax is seen. No bony abnormality is noted.  IMPRESSION: Bilateral effusions and airspace opacities stable from the prior study.   [HN]  1450 Paged Nephrology for dialysis [HN]  1450 Mild to moderate respiratory distress. 90% on RA so RN placed on 2L [place]. Called RT to place patient on BiPAP for WOB. [HN]  9323 Patient's WOB improved on BiPAP.  Patient is signed out to the oncoming ED physician Dr. Maryan Rued who is made aware of his history, presentation, exam, workup, and plan.  Plan is to perform EKG, urgent dialysis and reevaluate. [HN]    Clinical Course User Index [HN] Audley Hose, MD     Labs: I Ordered, and personally interpreted labs.  The pertinent results include:  those listed above  Imaging Studies ordered: I ordered imaging studies including CXR I independently visualized and interpreted imaging. I agree with the radiologist interpretation  Additional history obtained from patient, chart review.   Cardiac Monitoring: The patient was maintained on a cardiac monitor.  I personally viewed and interpreted the cardiac monitored which showed an underlying rhythm of:  NSR  Reevaluation: After the interventions noted above, I reevaluated the patient and found that they have :improved  Social Determinants of Health: Patient lives independently   Disposition:  Dialysis  Co morbidities that complicate the patient evaluation  Past Medical History:  Diagnosis Date   Anemia    Diabetes mellitus without complication ([place])    patient denies   Dialysis patient [facility])    End stage chronic kidney disease ([place])    Hypertension    ICH (intracerebral hemorrhage) ([place]) 05/20/2017   PAD (peripheral artery disease) ([place])  Shoulder pain, left 06/28/2013     Medicines Meds ordered this encounter  Medications   HYDROmorphone (DILAUDID) injection 0.5 mg    I have reviewed the patients home medicines and have made adjustments as needed  Problem List / ED Course: Problem List Items Addressed This Visit   None Visit Diagnoses     Acute respiratory distress    -  Primary                 This note was created using dictation software, which may contain spelling or grammatical errors.    Audley Hose, MD 04/10/22 [phone]

## 2022-04-10 NOTE — ED Provider Triage Note (Signed)
Emergency Medicine Provider Triage Evaluation Note  Daniel [name] , a 61 y.o. male  was evaluated in triage.  Pt complains of Swords of breath, leg swelling but having shortness of breath starting today, see if he was getting a full breath then, no associated chest pain with that he does note he is having worsening leg swelling like to is going on for a while but its gotten worse over last couple days.  Does note he has some back pain denies any recent trauma he has no other complaints..  Review of Systems  Positive: Chest pain, swelling Negative: Nausea, vomiting  Physical Exam  BP (!) 152/103 (BP Location: Right Arm)   Pulse 87   Temp (!) 97.3 F (36.3 C)   Resp (!) 25   SpO2 94%  Gen:   Awake, no distress   Resp:  Normal effort  MSK:   Moves extremities without difficulty  Other:    Medical Decision Making  Medically screening exam initiated at 3:36 AM.  Appropriate orders placed.  Ronda C Shiplett was informed that the remainder of the evaluation will be completed by another provider, this initial triage assessment does not replace that evaluation, and the importance of remaining in the ED until their evaluation is complete.  Lab work imaging been ordered will need further workup.   Marcello Fennel, PA-C 04/10/22 [phone]

## 2022-04-11 ENCOUNTER — Emergency Department (HOSPITAL_COMMUNITY): Payer: Medicare Other

## 2022-04-11 DIAGNOSIS — K31811 Angiodysplasia of stomach and duodenum with bleeding: Secondary | ICD-10-CM | POA: Diagnosis not present

## 2022-04-11 DIAGNOSIS — K922 Gastrointestinal hemorrhage, unspecified: Secondary | ICD-10-CM | POA: Diagnosis not present

## 2022-04-11 LAB — CULTURE, BLOOD (ROUTINE X 2)
Culture: NO GROWTH
Culture: NO GROWTH

## 2022-04-11 MED ORDER — OXYCODONE-ACETAMINOPHEN 5-325 MG PO TABS
1.0000 | ORAL_TABLET | Freq: Once | ORAL | Status: AC
  Administered 2022-04-11: 1 via ORAL
  Filled 2022-04-11: qty 1

## 2022-04-11 NOTE — Progress Notes (Signed)
   04/11/22 0113  Vitals  Temp 98.6 F (37 C)  Temp Source Oral  BP 108/66  MAP (mmHg) 81  BP Location Right Arm  BP Method Automatic  Patient Position (if appropriate) Lying  Pulse Rate 91  Pulse Rate Source Monitor  ECG Heart Rate 91  Resp (!) 28  Post Treatment  Dialyzer Clearance Lightly streaked  Duration of HD Treatment -hour(s) 3.5 hour(s)  Liters Processed 83.8  Fluid Removed (mL) 5000 mL  Tolerated HD Treatment Yes  Post-Hemodialysis Comments having abd aches, mpoving from lower center anteieor to right lower abd.  AVG/AVF Arterial Site Held (minutes) 5 minutes  AVG/AVF Venous Site Held (minutes) 5 minutes   TX fin. W/o difficulty.

## 2022-04-11 NOTE — ED Notes (Addendum)
Patient states "my wheelchair is at home" when this RN questions about it. Explained to patient the person picking him up will need to bring it with him, pt verbalized understanding by shaking his head up and down.   When patient referred to "ride" he was referring to a taxi voucher. Patient states "I have a wheelchair parked by the ramp. Its just dark." Patient is aware of alternative choice for transportation to home safely. Patient refuses. Charge is aware of situation and of patient plan to get inside home safely. After several assurance of patient safety and plan to get inside his home, a cab voucher was provided and placed in the waiting room.

## 2022-04-11 NOTE — ED Provider Notes (Signed)
Returned from dialysis. Apparently has right sided pelvis pain now. No other changes. Breathing easily. Will get ct stone study, treat pain.   CT negative. Pain controlled. Stable for discharge.    Calvin Chura, Corene Cornea, MD 04/11/22 [phone]

## 2022-04-11 NOTE — ED Notes (Signed)
ED Provider at bedside. 

## 2022-04-11 NOTE — ED Notes (Signed)
Patient transported to CT 

## 2022-04-13 ENCOUNTER — Other Ambulatory Visit: Payer: Self-pay

## 2022-04-13 ENCOUNTER — Emergency Department (HOSPITAL_COMMUNITY)
Admission: EM | Admit: 2022-04-13 | Discharge: 2022-04-13 | Discharge status: Against Medical Advice | Payer: Medicare Other | Source: Home / Self Care

## 2022-04-13 DIAGNOSIS — U071 COVID-19: Secondary | ICD-10-CM | POA: Insufficient documentation

## 2022-04-13 DIAGNOSIS — Z992 Dependence on renal dialysis: Secondary | ICD-10-CM | POA: Insufficient documentation

## 2022-04-13 DIAGNOSIS — Y900 Blood alcohol level of less than 20 mg/100 ml: Secondary | ICD-10-CM | POA: Insufficient documentation

## 2022-04-13 DIAGNOSIS — Z5321 Procedure and treatment not carried out due to patient leaving prior to being seen by health care provider: Secondary | ICD-10-CM | POA: Insufficient documentation

## 2022-04-13 LAB — ETHANOL: Alcohol, Ethyl (B): <10 mg/dL (ref <10)

## 2022-04-13 LAB — CBC
HCT: 22.4 % — ABNORMAL LOW (ref 39.0–52.0)
Hemoglobin: 7.3 g/dL — ABNORMAL LOW (ref 13.0–17.0)
MCH: 33.6 pg (ref 26.0–34.0)
MCHC: 32.6 g/dL (ref 30.0–36.0)
MCV: 103.2 fL — ABNORMAL HIGH (ref 80.0–100.0)
Platelets: 117 10*3/uL — ABNORMAL LOW (ref 150–400)
RBC: 2.17 MIL/uL — ABNORMAL LOW (ref 4.22–5.81)
RDW: 18.5 % — ABNORMAL HIGH (ref 11.5–15.5)
WBC: 4.6 10*3/uL (ref 4.0–10.5)
nRBC: 0 % (ref 0.0–0.2)

## 2022-04-13 LAB — BASIC METABOLIC PANEL
Anion gap: 15 (ref 5–15)
BUN: 60 mg/dL — ABNORMAL HIGH (ref 8–23)
CO2: 21 mmol/L — ABNORMAL LOW (ref 22–32)
Calcium: 8.4 mg/dL — ABNORMAL LOW (ref 8.9–10.3)
Chloride: 92 mmol/L — ABNORMAL LOW (ref 98–111)
Creatinine, Ser: 8.79 mg/dL — ABNORMAL HIGH (ref 0.61–1.24)
GFR, Estimated: 6 mL/min — ABNORMAL LOW (ref >60)
Glucose, Bld: 112 mg/dL — ABNORMAL HIGH (ref 70–99)
Potassium: 4.7 mmol/L (ref 3.5–5.1)
Sodium: 128 mmol/L — ABNORMAL LOW (ref 135–145)

## 2022-04-13 LAB — TROPONIN I (HIGH SENSITIVITY): Troponin I (High Sensitivity): 21 ng/L — ABNORMAL HIGH (ref <18)

## 2022-04-13 LAB — RESP PANEL BY RT-PCR (RSV, FLU A&B, COVID)  RVPGX2
Influenza A by PCR: NEGATIVE
Influenza B by PCR: NEGATIVE
Resp Syncytial Virus by PCR: NEGATIVE
SARS Coronavirus 2 by RT PCR: POSITIVE — AB

## 2022-04-13 NOTE — ED Triage Notes (Signed)
Patient arrived with EMS from home missed hemodialysis treatment last Friday , reports SOB/chest tightness today , emesis x1 en route , received ASA 324 mg by EMS prior to arrival .

## 2022-04-13 NOTE — ED Notes (Signed)
Pt called cab and left AMA

## 2022-04-13 NOTE — ED Provider Triage Note (Signed)
Emergency Medicine Provider Triage Evaluation Note  Daniel [name] , a 61 y.o. male  was evaluated in triage.  Pt complains of shortness of breath, chest tightness, fever, chills, missed dialysis, however patient on review was seen and evaluated on 12/14 received dialysis in the emergency department.  Patient with history of alcohol, cocaine abuse.  He is a poor historian on my exam and answers to most questions.  Denies any nausea, vomiting.  He reports that he does still make some urine.  Review of Systems  Positive: Chest tightness, shortness of breath, missed dialysis Negative: Nausea, vomiting, abdominal pain  Physical Exam  There were no vitals taken for this visit. Gen:   Awake, no distress   Resp:  Normal effort  MSK:   Moves extremities without difficulty  Other:  Fistula in place in left upper extremity with palpable thrill.  Medical Decision Making  Medically screening exam initiated at 8:21 PM.  Appropriate orders placed.  Christifer C Tenaglia was informed that the remainder of the evaluation will be completed by another provider, this initial triage assessment does not replace that evaluation, and the importance of remaining in the ED until their evaluation is complete.  Workup initiated   Anselmo Pickler, [place] 04/13/22 2024

## 2022-04-14 ENCOUNTER — Inpatient Hospital Stay (HOSPITAL_COMMUNITY): Payer: Medicare Other

## 2022-04-14 ENCOUNTER — Encounter (HOSPITAL_COMMUNITY): Payer: Self-pay

## 2022-04-14 ENCOUNTER — Ambulatory Visit (HOSPITAL_COMMUNITY): Payer: Medicare Other

## 2022-04-14 ENCOUNTER — Inpatient Hospital Stay (HOSPITAL_COMMUNITY)
Admission: EM | Admit: 2022-04-14 | Discharge: 2022-05-05 | DRG: 377 | Disposition: A | Discharge status: Skilled Nursing Facility | Payer: Medicare Other | Attending: Student | Admitting: Student

## 2022-04-14 ENCOUNTER — Emergency Department (HOSPITAL_COMMUNITY): Payer: Medicare Other

## 2022-04-14 ENCOUNTER — Other Ambulatory Visit: Payer: Self-pay

## 2022-04-14 ENCOUNTER — Telehealth: Payer: Self-pay

## 2022-04-14 DIAGNOSIS — Z6830 Body mass index (BMI) 30.0-30.9, adult: Secondary | ICD-10-CM

## 2022-04-14 DIAGNOSIS — K5520 Angiodysplasia of colon without hemorrhage: Secondary | ICD-10-CM

## 2022-04-14 DIAGNOSIS — K31811 Angiodysplasia of stomach and duodenum with bleeding: Secondary | ICD-10-CM | POA: Diagnosis present

## 2022-04-14 DIAGNOSIS — K766 Portal hypertension: Secondary | ICD-10-CM | POA: Diagnosis present

## 2022-04-14 DIAGNOSIS — E871 Hypo-osmolality and hyponatremia: Secondary | ICD-10-CM | POA: Diagnosis present

## 2022-04-14 DIAGNOSIS — R5381 Other malaise: Secondary | ICD-10-CM | POA: Diagnosis not present

## 2022-04-14 DIAGNOSIS — K920 Hematemesis: Secondary | ICD-10-CM | POA: Diagnosis not present

## 2022-04-14 DIAGNOSIS — I120 Hypertensive chronic kidney disease with stage 5 chronic kidney disease or end stage renal disease: Secondary | ICD-10-CM | POA: Diagnosis present

## 2022-04-14 DIAGNOSIS — B1920 Unspecified viral hepatitis C without hepatic coma: Secondary | ICD-10-CM | POA: Diagnosis present

## 2022-04-14 DIAGNOSIS — Z9911 Dependence on respirator [ventilator] status: Secondary | ICD-10-CM

## 2022-04-14 DIAGNOSIS — K921 Melena: Principal | ICD-10-CM

## 2022-04-14 DIAGNOSIS — G9341 Metabolic encephalopathy: Secondary | ICD-10-CM | POA: Diagnosis present

## 2022-04-14 DIAGNOSIS — K264 Chronic or unspecified duodenal ulcer with hemorrhage: Secondary | ICD-10-CM | POA: Diagnosis present

## 2022-04-14 DIAGNOSIS — Z91158 Patient's noncompliance with renal dialysis for other reason: Secondary | ICD-10-CM

## 2022-04-14 DIAGNOSIS — D500 Iron deficiency anemia secondary to blood loss (chronic): Secondary | ICD-10-CM | POA: Diagnosis present

## 2022-04-14 DIAGNOSIS — Z993 Dependence on wheelchair: Secondary | ICD-10-CM

## 2022-04-14 DIAGNOSIS — R188 Other ascites: Secondary | ICD-10-CM | POA: Diagnosis present

## 2022-04-14 DIAGNOSIS — J9621 Acute and chronic respiratory failure with hypoxia: Secondary | ICD-10-CM | POA: Diagnosis not present

## 2022-04-14 DIAGNOSIS — J90 Pleural effusion, not elsewhere classified: Secondary | ICD-10-CM | POA: Diagnosis present

## 2022-04-14 DIAGNOSIS — D649 Anemia, unspecified: Secondary | ICD-10-CM | POA: Diagnosis present

## 2022-04-14 DIAGNOSIS — E872 Acidosis, unspecified: Secondary | ICD-10-CM | POA: Diagnosis present

## 2022-04-14 DIAGNOSIS — E875 Hyperkalemia: Secondary | ICD-10-CM | POA: Diagnosis present

## 2022-04-14 DIAGNOSIS — Z79899 Other long term (current) drug therapy: Secondary | ICD-10-CM

## 2022-04-14 DIAGNOSIS — K279 Peptic ulcer, site unspecified, unspecified as acute or chronic, without hemorrhage or perforation: Secondary | ICD-10-CM | POA: Diagnosis present

## 2022-04-14 DIAGNOSIS — D62 Acute posthemorrhagic anemia: Secondary | ICD-10-CM | POA: Diagnosis present

## 2022-04-14 DIAGNOSIS — J9601 Acute respiratory failure with hypoxia: Secondary | ICD-10-CM | POA: Diagnosis present

## 2022-04-14 DIAGNOSIS — F10931 Alcohol use, unspecified with withdrawal delirium: Secondary | ICD-10-CM | POA: Diagnosis not present

## 2022-04-14 DIAGNOSIS — N2581 Secondary hyperparathyroidism of renal origin: Secondary | ICD-10-CM | POA: Diagnosis present

## 2022-04-14 DIAGNOSIS — K7031 Alcoholic cirrhosis of liver with ascites: Secondary | ICD-10-CM | POA: Diagnosis not present

## 2022-04-14 DIAGNOSIS — Z888 Allergy status to other drugs, medicaments and biological substances status: Secondary | ICD-10-CM

## 2022-04-14 DIAGNOSIS — M898X9 Other specified disorders of bone, unspecified site: Secondary | ICD-10-CM | POA: Diagnosis present

## 2022-04-14 DIAGNOSIS — F1010 Alcohol abuse, uncomplicated: Secondary | ICD-10-CM | POA: Diagnosis not present

## 2022-04-14 DIAGNOSIS — K3189 Other diseases of stomach and duodenum: Secondary | ICD-10-CM | POA: Diagnosis not present

## 2022-04-14 DIAGNOSIS — Z923 Personal history of irradiation: Secondary | ICD-10-CM

## 2022-04-14 DIAGNOSIS — K7469 Other cirrhosis of liver: Secondary | ICD-10-CM | POA: Diagnosis not present

## 2022-04-14 DIAGNOSIS — E876 Hypokalemia: Secondary | ICD-10-CM | POA: Diagnosis present

## 2022-04-14 DIAGNOSIS — G8929 Other chronic pain: Secondary | ICD-10-CM | POA: Diagnosis present

## 2022-04-14 DIAGNOSIS — Z8505 Personal history of malignant neoplasm of liver: Secondary | ICD-10-CM

## 2022-04-14 DIAGNOSIS — J9611 Chronic respiratory failure with hypoxia: Secondary | ICD-10-CM

## 2022-04-14 DIAGNOSIS — Z66 Do not resuscitate: Secondary | ICD-10-CM | POA: Diagnosis present

## 2022-04-14 DIAGNOSIS — D631 Anemia in chronic kidney disease: Secondary | ICD-10-CM

## 2022-04-14 DIAGNOSIS — D696 Thrombocytopenia, unspecified: Secondary | ICD-10-CM | POA: Diagnosis present

## 2022-04-14 DIAGNOSIS — E118 Type 2 diabetes mellitus with unspecified complications: Secondary | ICD-10-CM | POA: Diagnosis present

## 2022-04-14 DIAGNOSIS — Z781 Physical restraint status: Secondary | ICD-10-CM

## 2022-04-14 DIAGNOSIS — E43 Unspecified severe protein-calorie malnutrition: Secondary | ICD-10-CM | POA: Diagnosis present

## 2022-04-14 DIAGNOSIS — N186 End stage renal disease: Secondary | ICD-10-CM | POA: Diagnosis present

## 2022-04-14 DIAGNOSIS — N179 Acute kidney failure, unspecified: Secondary | ICD-10-CM | POA: Diagnosis present

## 2022-04-14 DIAGNOSIS — E11649 Type 2 diabetes mellitus with hypoglycemia without coma: Secondary | ICD-10-CM | POA: Diagnosis not present

## 2022-04-14 DIAGNOSIS — K317 Polyp of stomach and duodenum: Secondary | ICD-10-CM | POA: Diagnosis present

## 2022-04-14 DIAGNOSIS — K7460 Unspecified cirrhosis of liver: Secondary | ICD-10-CM | POA: Diagnosis present

## 2022-04-14 DIAGNOSIS — Z992 Dependence on renal dialysis: Secondary | ICD-10-CM | POA: Diagnosis not present

## 2022-04-14 DIAGNOSIS — E162 Hypoglycemia, unspecified: Secondary | ICD-10-CM | POA: Diagnosis not present

## 2022-04-14 DIAGNOSIS — J9620 Acute and chronic respiratory failure, unspecified whether with hypoxia or hypercapnia: Secondary | ICD-10-CM | POA: Insufficient documentation

## 2022-04-14 DIAGNOSIS — Z515 Encounter for palliative care: Secondary | ICD-10-CM | POA: Diagnosis not present

## 2022-04-14 DIAGNOSIS — Z89512 Acquired absence of left leg below knee: Secondary | ICD-10-CM

## 2022-04-14 DIAGNOSIS — E1165 Type 2 diabetes mellitus with hyperglycemia: Secondary | ICD-10-CM | POA: Diagnosis present

## 2022-04-14 DIAGNOSIS — U071 COVID-19: Secondary | ICD-10-CM | POA: Diagnosis present

## 2022-04-14 DIAGNOSIS — J810 Acute pulmonary edema: Secondary | ICD-10-CM | POA: Diagnosis not present

## 2022-04-14 DIAGNOSIS — J1282 Pneumonia due to coronavirus disease 2019: Secondary | ICD-10-CM | POA: Diagnosis present

## 2022-04-14 DIAGNOSIS — G9340 Encephalopathy, unspecified: Secondary | ICD-10-CM | POA: Diagnosis not present

## 2022-04-14 DIAGNOSIS — E1151 Type 2 diabetes mellitus with diabetic peripheral angiopathy without gangrene: Secondary | ICD-10-CM | POA: Diagnosis present

## 2022-04-14 DIAGNOSIS — I8510 Secondary esophageal varices without bleeding: Secondary | ICD-10-CM | POA: Diagnosis present

## 2022-04-14 DIAGNOSIS — K922 Gastrointestinal hemorrhage, unspecified: Secondary | ICD-10-CM | POA: Diagnosis present

## 2022-04-14 DIAGNOSIS — F17210 Nicotine dependence, cigarettes, uncomplicated: Secondary | ICD-10-CM | POA: Diagnosis present

## 2022-04-14 DIAGNOSIS — Z8673 Personal history of transient ischemic attack (TIA), and cerebral infarction without residual deficits: Secondary | ICD-10-CM

## 2022-04-14 DIAGNOSIS — R4182 Altered mental status, unspecified: Secondary | ICD-10-CM | POA: Diagnosis not present

## 2022-04-14 DIAGNOSIS — J9801 Acute bronchospasm: Secondary | ICD-10-CM | POA: Diagnosis not present

## 2022-04-14 DIAGNOSIS — E1122 Type 2 diabetes mellitus with diabetic chronic kidney disease: Secondary | ICD-10-CM | POA: Diagnosis present

## 2022-04-14 DIAGNOSIS — E7220 Disorder of urea cycle metabolism, unspecified: Secondary | ICD-10-CM | POA: Diagnosis not present

## 2022-04-14 LAB — I-STAT ARTERIAL BLOOD GAS, ED
Acid-base deficit: 3 mmol/L — ABNORMAL HIGH (ref 0.0–2.0)
Bicarbonate: 21.7 mmol/L (ref 20.0–28.0)
Calcium, Ion: 1.11 mmol/L — ABNORMAL LOW (ref 1.15–1.40)
HCT: 21 % — ABNORMAL LOW (ref 39.0–52.0)
Hemoglobin: 7.1 g/dL — ABNORMAL LOW (ref 13.0–17.0)
O2 Saturation: 99 %
Patient temperature: 97
Potassium: 5.6 mmol/L — ABNORMAL HIGH (ref 3.5–5.1)
Sodium: 131 mmol/L — ABNORMAL LOW (ref 135–145)
TCO2: 23 mmol/L (ref 22–32)
pCO2 arterial: 36.5 mmHg (ref 32–48)
pH, Arterial: 7.377 (ref 7.35–7.45)
pO2, Arterial: 118 mmHg — ABNORMAL HIGH (ref 83–108)

## 2022-04-14 LAB — CBC WITH DIFFERENTIAL/PLATELET
Abs Immature Granulocytes: 0.04 10*3/uL (ref 0.00–0.07)
Basophils Absolute: 0 10*3/uL (ref 0.0–0.1)
Basophils Relative: 0 %
Eosinophils Absolute: 0 10*3/uL (ref 0.0–0.5)
Eosinophils Relative: 0 %
HCT: 17.4 % — ABNORMAL LOW (ref 39.0–52.0)
Hemoglobin: 5.7 g/dL — CL (ref 13.0–17.0)
Immature Granulocytes: 1 %
Lymphocytes Relative: 15 %
Lymphs Abs: 0.9 10*3/uL (ref 0.7–4.0)
MCH: 33.5 pg (ref 26.0–34.0)
MCHC: 32.8 g/dL (ref 30.0–36.0)
MCV: 102.4 fL — ABNORMAL HIGH (ref 80.0–100.0)
Monocytes Absolute: 0.6 10*3/uL (ref 0.1–1.0)
Monocytes Relative: 11 %
Neutro Abs: 4.3 10*3/uL (ref 1.7–7.7)
Neutrophils Relative %: 73 %
Platelets: 123 10*3/uL — ABNORMAL LOW (ref 150–400)
RBC: 1.7 MIL/uL — ABNORMAL LOW (ref 4.22–5.81)
RDW: 18.6 % — ABNORMAL HIGH (ref 11.5–15.5)
WBC: 5.8 10*3/uL (ref 4.0–10.5)
nRBC: 0 % (ref 0.0–0.2)

## 2022-04-14 LAB — BASIC METABOLIC PANEL
Anion gap: 19 — ABNORMAL HIGH (ref 5–15)
BUN: 95 mg/dL — ABNORMAL HIGH (ref 8–23)
CO2: 18 mmol/L — ABNORMAL LOW (ref 22–32)
Calcium: 8.5 mg/dL — ABNORMAL LOW (ref 8.9–10.3)
Chloride: 93 mmol/L — ABNORMAL LOW (ref 98–111)
Creatinine, Ser: 9.83 mg/dL — ABNORMAL HIGH (ref 0.61–1.24)
GFR, Estimated: 5 mL/min — ABNORMAL LOW (ref >60)
Glucose, Bld: 104 mg/dL — ABNORMAL HIGH (ref 70–99)
Potassium: 5.1 mmol/L (ref 3.5–5.1)
Sodium: 130 mmol/L — ABNORMAL LOW (ref 135–145)

## 2022-04-14 LAB — CBC
HCT: 21.7 % — ABNORMAL LOW (ref 39.0–52.0)
Hemoglobin: 7.3 g/dL — ABNORMAL LOW (ref 13.0–17.0)
MCH: 33 pg (ref 26.0–34.0)
MCHC: 33.6 g/dL (ref 30.0–36.0)
MCV: 98.2 fL (ref 80.0–100.0)
Platelets: 121 10*3/uL — ABNORMAL LOW (ref 150–400)
RBC: 2.21 MIL/uL — ABNORMAL LOW (ref 4.22–5.81)
RDW: 18.3 % — ABNORMAL HIGH (ref 11.5–15.5)
WBC: 6.5 10*3/uL (ref 4.0–10.5)
nRBC: 0 % (ref 0.0–0.2)

## 2022-04-14 LAB — LIPASE, BLOOD: Lipase: 57 U/L — ABNORMAL HIGH (ref 11–51)

## 2022-04-14 LAB — COMPREHENSIVE METABOLIC PANEL
ALT: 28 U/L (ref 0–44)
AST: 103 U/L — ABNORMAL HIGH (ref 15–41)
Albumin: 1.7 g/dL — ABNORMAL LOW (ref 3.5–5.0)
Alkaline Phosphatase: 75 U/L (ref 38–126)
Anion gap: 20 — ABNORMAL HIGH (ref 5–15)
BUN: 98 mg/dL — ABNORMAL HIGH (ref 8–23)
CO2: 18 mmol/L — ABNORMAL LOW (ref 22–32)
Calcium: 8.6 mg/dL — ABNORMAL LOW (ref 8.9–10.3)
Chloride: 92 mmol/L — ABNORMAL LOW (ref 98–111)
Creatinine, Ser: 10.05 mg/dL — ABNORMAL HIGH (ref 0.61–1.24)
GFR, Estimated: 5 mL/min — ABNORMAL LOW (ref >60)
Glucose, Bld: 104 mg/dL — ABNORMAL HIGH (ref 70–99)
Potassium: 5.3 mmol/L — ABNORMAL HIGH (ref 3.5–5.1)
Sodium: 130 mmol/L — ABNORMAL LOW (ref 135–145)
Total Bilirubin: 1.5 mg/dL — ABNORMAL HIGH (ref 0.3–1.2)
Total Protein: 7.2 g/dL (ref 6.5–8.1)

## 2022-04-14 LAB — PROTIME-INR
INR: 1.3 — ABNORMAL HIGH (ref 0.8–1.2)
Prothrombin Time: 16.4 seconds — ABNORMAL HIGH (ref 11.4–15.2)

## 2022-04-14 LAB — ETHANOL: Alcohol, Ethyl (B): <10 mg/dL (ref <10)

## 2022-04-14 LAB — TROPONIN I (HIGH SENSITIVITY): Troponin I (High Sensitivity): 26 ng/L — ABNORMAL HIGH (ref <18)

## 2022-04-14 LAB — PREPARE RBC (CROSSMATCH)

## 2022-04-14 LAB — GLUCOSE, CAPILLARY: Glucose-Capillary: 102 mg/dL — ABNORMAL HIGH (ref 70–99)

## 2022-04-14 LAB — LACTIC ACID, PLASMA
Lactic Acid, Venous: 5.9 mmol/L (ref 0.5–1.9)
Lactic Acid, Venous: 4 mmol/L (ref 0.5–1.9)

## 2022-04-14 LAB — HEPATITIS B SURFACE ANTIGEN: Hepatitis B Surface Ag: NONREACTIVE

## 2022-04-14 LAB — AMMONIA: Ammonia: 45 umol/L — ABNORMAL HIGH (ref 9–35)

## 2022-04-14 MED ORDER — LIDOCAINE-PRILOCAINE 2.5-2.5 % EX CREA: 1.0000 | TOPICAL_CREAM | CUTANEOUS | Status: DC | PRN

## 2022-04-14 MED ORDER — POLYETHYLENE GLYCOL 3350 17 G PO PACK: 17.0000 g | PACK | Freq: Every day | ORAL | Status: DC | PRN

## 2022-04-14 MED ORDER — PANTOPRAZOLE 80MG IVPB - SIMPLE MED
80.0000 mg | Freq: Once | INTRAVENOUS | Status: DC
  Filled 2022-04-14: qty 100

## 2022-04-14 MED ORDER — ONDANSETRON HCL 4 MG/2ML IJ SOLN
4.0000 mg | Freq: Four times a day (QID) | INTRAMUSCULAR | Status: DC | PRN
  Administered 2022-05-04: 4 mg via INTRAVENOUS
  Filled 2022-04-14: qty 2

## 2022-04-14 MED ORDER — ROCURONIUM BROMIDE 50 MG/5ML IV SOLN
INTRAVENOUS | Status: AC | PRN
  Administered 2022-04-14: 100 mg via INTRAVENOUS

## 2022-04-14 MED ORDER — ALTEPLASE 2 MG IJ SOLR: 2.0000 mg | Freq: Once | INTRAMUSCULAR | Status: DC | PRN

## 2022-04-14 MED ORDER — POLYETHYLENE GLYCOL 3350 17 G PO PACK: 17.0000 g | PACK | Freq: Every day | ORAL | Status: DC

## 2022-04-14 MED ORDER — DOCUSATE SODIUM 50 MG/5ML PO LIQD: 100.0000 mg | Freq: Two times a day (BID) | ORAL | Status: DC

## 2022-04-14 MED ORDER — SODIUM ZIRCONIUM CYCLOSILICATE 10 G PO PACK
10.0000 g | PACK | Freq: Every day | ORAL | Status: DC
  Administered 2022-04-14: 10 g via ORAL
  Filled 2022-04-14: qty 1

## 2022-04-14 MED ORDER — FENTANYL 2500MCG IN NS 250ML (10MCG/ML) PREMIX INFUSION
50.0000 ug/h | INTRAVENOUS | Status: DC
  Administered 2022-04-14 – 2022-04-15 (×3): 50 ug/h via INTRAVENOUS
  Filled 2022-04-14: qty 250

## 2022-04-14 MED ORDER — SODIUM CHLORIDE 0.9% IV SOLUTION: Freq: Once | INTRAVENOUS | Status: AC

## 2022-04-14 MED ORDER — OCTREOTIDE LOAD VIA INFUSION
50.0000 ug | Freq: Once | INTRAVENOUS | Status: AC
  Administered 2022-04-14: 50 ug via INTRAVENOUS
  Filled 2022-04-14: qty 25

## 2022-04-14 MED ORDER — LIDOCAINE HCL (PF) 1 % IJ SOLN: 5.0000 mL | INTRAMUSCULAR | Status: DC | PRN

## 2022-04-14 MED ORDER — HEPARIN SODIUM (PORCINE) 1000 UNIT/ML DIALYSIS
1000.0000 [IU] | INTRAMUSCULAR | Status: DC | PRN
  Administered 2022-04-15: 2400 [IU]

## 2022-04-14 MED ORDER — SODIUM CHLORIDE 0.9 % IV SOLN
1.0000 g | Freq: Once | INTRAVENOUS | Status: AC
  Administered 2022-04-14: 1 g via INTRAVENOUS
  Filled 2022-04-14: qty 10

## 2022-04-14 MED ORDER — SODIUM CHLORIDE 0.9 % IV SOLN
50.0000 ug/h | INTRAVENOUS | Status: AC
  Administered 2022-04-14 – 2022-04-17 (×7): 50 ug/h via INTRAVENOUS
  Filled 2022-04-14 (×8): qty 1

## 2022-04-14 MED ORDER — ANTICOAGULANT SODIUM CITRATE 4% (200MG/5ML) IV SOLN: 5.0000 mL | Status: DC | PRN

## 2022-04-14 MED ORDER — FENTANYL CITRATE PF 50 MCG/ML IJ SOSY
50.0000 ug | PREFILLED_SYRINGE | Freq: Once | INTRAMUSCULAR | Status: AC
  Filled 2022-04-14: qty 1

## 2022-04-14 MED ORDER — LEVETIRACETAM IN NACL 1000 MG/100ML IV SOLN
1000.0000 mg | Freq: Once | INTRAVENOUS | Status: AC
  Administered 2022-04-14: 1000 mg via INTRAVENOUS
  Filled 2022-04-14: qty 100

## 2022-04-14 MED ORDER — PANTOPRAZOLE SODIUM 40 MG IV SOLR: 40.0000 mg | Freq: Two times a day (BID) | INTRAVENOUS | Status: DC

## 2022-04-14 MED ORDER — FENTANYL CITRATE PF 50 MCG/ML IJ SOSY
50.0000 ug | PREFILLED_SYRINGE | INTRAMUSCULAR | Status: AC | PRN
  Administered 2022-04-14 (×3): 50 ug via INTRAVENOUS
  Filled 2022-04-14 (×3): qty 1

## 2022-04-14 MED ORDER — PROPOFOL 1000 MG/100ML IV EMUL
5.0000 ug/kg/min | INTRAVENOUS | Status: DC
  Administered 2022-04-14: 15 ug/kg/min via INTRAVENOUS
  Administered 2022-04-14 – 2022-04-15 (×2): 40 ug/kg/min via INTRAVENOUS
  Administered 2022-04-15: 50 ug/kg/min via INTRAVENOUS
  Administered 2022-04-15 (×3): 40 ug/kg/min via INTRAVENOUS
  Administered 2022-04-16: 30 ug/kg/min via INTRAVENOUS
  Administered 2022-04-16: 25 ug/kg/min via INTRAVENOUS
  Administered 2022-04-16: 30 ug/kg/min via INTRAVENOUS
  Filled 2022-04-14 (×8): qty 100
  Filled 2022-04-14: qty 200

## 2022-04-14 MED ORDER — CHLORHEXIDINE GLUCONATE CLOTH 2 % EX PADS
6.0000 | MEDICATED_PAD | Freq: Every day | CUTANEOUS | Status: DC
  Administered 2022-04-14 – 2022-04-24 (×10): 6 via TOPICAL

## 2022-04-14 MED ORDER — SODIUM CHLORIDE 0.9% IV SOLUTION: 250.0000 mL | Freq: Once | INTRAVENOUS | Status: DC

## 2022-04-14 MED ORDER — FENTANYL CITRATE PF 50 MCG/ML IJ SOSY
50.0000 ug | PREFILLED_SYRINGE | INTRAMUSCULAR | Status: DC | PRN
  Administered 2022-04-14: 50 ug via INTRAVENOUS
  Filled 2022-04-14: qty 1

## 2022-04-14 MED ORDER — ACETAMINOPHEN 325 MG PO TABS: 650.0000 mg | ORAL_TABLET | Freq: Four times a day (QID) | ORAL | Status: DC | PRN

## 2022-04-14 MED ORDER — ACETAMINOPHEN 650 MG RE SUPP: 650.0000 mg | Freq: Four times a day (QID) | RECTAL | Status: DC | PRN

## 2022-04-14 MED ORDER — DOCUSATE SODIUM 100 MG PO CAPS
100.0000 mg | ORAL_CAPSULE | Freq: Two times a day (BID) | ORAL | Status: DC | PRN
  Filled 2022-04-14: qty 1

## 2022-04-14 MED ORDER — ETOMIDATE 2 MG/ML IV SOLN
INTRAVENOUS | Status: AC | PRN
  Administered 2022-04-14: 30 mg via INTRAVENOUS

## 2022-04-14 MED ORDER — PANTOPRAZOLE INFUSION (NEW) - SIMPLE MED
8.0000 mg/h | INTRAVENOUS | Status: DC
  Filled 2022-04-14: qty 100

## 2022-04-14 MED ORDER — FENTANYL BOLUS VIA INFUSION
50.0000 ug | INTRAVENOUS | Status: DC | PRN
  Administered 2022-04-16: 25 ug via INTRAVENOUS
  Administered 2022-04-16: 50 ug via INTRAVENOUS
  Administered 2022-04-16: 25 ug via INTRAVENOUS
  Administered 2022-04-17: 50 ug via INTRAVENOUS
  Administered 2022-04-17: 100 ug via INTRAVENOUS
  Administered 2022-04-17: 50 ug via INTRAVENOUS

## 2022-04-14 MED ORDER — ONDANSETRON HCL 4 MG PO TABS: 4.0000 mg | ORAL_TABLET | Freq: Four times a day (QID) | ORAL | Status: DC | PRN

## 2022-04-14 MED ORDER — FENTANYL CITRATE PF 50 MCG/ML IJ SOSY: 50.0000 ug | PREFILLED_SYRINGE | INTRAMUSCULAR | Status: DC | PRN

## 2022-04-14 MED ORDER — ACETAMINOPHEN 325 MG PO TABS: 650.0000 mg | ORAL_TABLET | Freq: Once | ORAL | Status: DC

## 2022-04-14 MED ORDER — PANTOPRAZOLE SODIUM 40 MG IV SOLR
40.0000 mg | Freq: Two times a day (BID) | INTRAVENOUS | Status: DC
  Administered 2022-04-14 – 2022-04-29 (×31): 40 mg via INTRAVENOUS
  Filled 2022-04-14 (×31): qty 10

## 2022-04-14 MED ORDER — PENTAFLUOROPROP-TETRAFLUOROETH EX AERO: 1.0000 | INHALATION_SPRAY | CUTANEOUS | Status: DC | PRN

## 2022-04-14 NOTE — ED Provider Notes (Signed)
Physical Exam  BP (!) 185/88   Pulse (!) 110   Temp (!) 96.7 F (35.9 C) (Axillary)   Resp 16   SpO2 100%   Physical Exam  Procedures  Procedure Name: Intubation Date/Time: 04/14/2022 5:29 PM  Performed by: Phyllis Ginger, MDPre-anesthesia Checklist: Patient identified, Patient being monitored, Emergency Drugs available, Timeout performed and Suction available Oxygen Delivery Method: Non-rebreather mask Preoxygenation: Pre-oxygenation with 100% oxygen Induction Type: Rapid sequence Ventilation: Mask ventilation without difficulty Laryngoscope Size: Glidescope and 4 Grade View: Grade I Tube size: 7.5 mm Number of attempts: 1 Airway Equipment and Method: Rigid stylet Placement Confirmation: ETT inserted through vocal cords under direct vision, CO2 detector and Breath sounds checked- equal and bilateral Secured at: 24 cm Tube secured with: Tape Dental Injury: Teeth and Oropharynx as per pre-operative assessment  Difficulty Due To: Difficulty was anticipated    .Critical Care  Performed by: Varney Biles, MD Authorized by: Varney Biles, MD   Critical care provider statement:    Critical care time (minutes):  71   Critical care was necessary to treat or prevent imminent or life-threatening deterioration of the following conditions:  Circulatory failure, CNS failure or compromise and metabolic crisis (Acute GI bleed)   Critical care was time spent personally by me on the following activities:  Discussions with consultants, evaluation of patient's response to treatment, examination of patient, ordering and review of laboratory studies, ordering and review of radiographic studies, ordering and performing treatments and interventions, pulse oximetry, re-evaluation of patient's condition, review of old charts, development of treatment plan with patient or surrogate, interpretation of cardiac output measurements and ventilator management .Central Line  Date/Time: 04/14/2022 5:33  PM  Performed by: Varney Biles, MD Authorized by: Varney Biles, MD   Consent:    Consent obtained:  Emergent situation Universal protocol:    Immediately prior to procedure, a time out was called: yes     Patient identity confirmed:  Arm band Pre-procedure details:    Indication(s): central venous access and insufficient peripheral access     Hand hygiene: Hand hygiene performed prior to insertion     Sterile barrier technique: All elements of maximal sterile technique followed     Skin preparation:  Chlorhexidine   Skin preparation agent: Skin preparation agent completely dried prior to procedure   Procedure details:    Location:  R internal jugular   Procedural supplies:  Triple lumen   Ultrasound guidance: yes     Ultrasound guidance timing: real time     Sterile ultrasound techniques: Sterile gel and sterile probe covers were used     Number of attempts:  1   Successful placement: yes   Post-procedure details:    Post-procedure:  Line sutured and dressing applied   Assessment:  Blood return through all ports, no pneumothorax on x-ray, free fluid flow and placement verified by x-ray   Procedure completion:  Tolerated well, no immediate complications   ED Course / MDM   Clinical Course as of 04/14/22 1734  Mon Apr 14, 2022  1321 DG Chest [place] 1 View [OZ]    Clinical Course User Index [OZ] Luvenia Heller, PA-C   Medical Decision Making Amount and/or Complexity of Data Reviewed Labs: ordered. Radiology: ordered.  Risk Prescription drug management. Decision regarding hospitalization.   Patient with history of ESRD on hemodialysis, alcoholic liver cirrhosis, hepatic cancer, upper GI bleed with known ulcers comes in with chief complaint of weakness.  Patient found to have symptomatic anemia with hemoglobin  of 5.7. He had received initial treatment orders and was awaiting bed placement.  Per nursing staff, patient started having seizure-like activity and he was  unresponsive for about 30 seconds.  He then started vomiting and coffee-ground emesis was noted.  Patient diaphoretic.  I was asked to assess the patient.  We decided to intubate the patient because of airway protection concerns, in the setting of seizure-like activity, unresponsiveness and active hematemesis.  Intubation completed without any difficulty.  Patient being assessed for access.  He currently has 2 20-gauge IV access.  He is going to require octreotide drip, plus minus Protonix drip, blood products, sedation and octreotide drip.  Likely will need a central line.  Might be okay putting in a triple-lumen rather than Cordis given he already has 2 x  20-gauge IVs.  Dispo: ICU admission. ICU consulted.        Varney Biles, MD 04/14/22 1734

## 2022-04-14 NOTE — H&P (Addendum)
History and Physical    Daniel [name] ZOX:[id number] DOB: [birth year] DOA: 04/14/2022  PCP: Grayce Sessions, NP   Chief Complaint: Symptomatic anemia  HPI: Daniel [name] is a 61 y.o. male well known to the hospital system having presented eight times to our ED in the last month with medical history significant of end-stage renal disease on hemodialysis Tuesday Thursday Saturdays, diabetes mellitus type 2, ICH in 2019, PAD status post left BKA .chronic anemia, history of recurrent GI bleed with 2 duodenal ulcers/AVM, alcoholic cirrhosis with hepatocellular carcinoma right upper lung mass chronic back pain.  Patient presents today with symptomatic anemia, generally lethargic and tired, patient has also missed dialysis multiple times this past week due to transportation issues per report - last reported outpatient HD 04/07/22 a week prior to admission - received urgent HD here on 12/14 while in the ED.  ED Course: Presents with melena/BRBPR weakness and lethargy, hemoglobin noted to be 5.7 at intake down from his baseline around 7.5 to 8. GI consulted given history of recurrent GI bleeds requiring procedures in the past.  Nephrology consulted given need for dialysis. Hospitalist called for admission.  Review of Systems: As per HPI lethargy, weakness.  Denies nausea vomiting diarrhea constipation headache fevers chills or chest pain.  Assessment/Plan Active Problems:   Acute blood loss anemia   Alcohol abuse   ESRD (end stage renal disease) on dialysis (HCC)   Thrombocytopenia (HCC)   Peptic ulcer disease   Diabetes mellitus type 2, controlled, with complications (HCC)   Physical deconditioning   Iron deficiency anemia due to chronic blood loss   Acute on chronic anemia   Cirrhosis of liver with ascites (HCC)   Symptomatic anemia  Acute symptomatic anemia likely to acute blood loss anemia on chronic anemia of chronic disease and chronic blood loss -GI following, appreciate  insight recommendations, multiple endoscopy procedures this year per their documentation -Continue transfusion, unclear if patient will need recurrent endoscopy during this hospitalization -Likely exacerbated by ESRD and dialysis with chronic anemia around 7.5-8  COVID-positive, without acute hypoxic respiratory failure or GI symptoms Likely incidental finding -COVID-positive via swab from prior ED presentation, patient is not hypoxic without fevers chills or leukocytosis -This is likely an incidental finding however will quarantine patient for 5 days per protocol -No indication for treatment at this time  ESRD, MWF -Nephrology to see, labs appear stable  Profound noncompliance -Multiple admissions/missed dialysis and questionable medication compliance, lengthy discussion with pati  Alcohol use and abuse disorder, high risk for alcohol withdrawal syndrome -Monitor closely - etoh level negative - high risk for withdrawals given unknown last drink. Likely >24h ago at this point. -Start CIWA protocol  DVT prophylaxis: None give active bleed as above  Code Status: Full  Family Communication: None present  Status is: Inpt  Dispo: The patient is from: Home              Anticipated d/c is to: TBD              Anticipated d/c date is: 72+h              Patient currently NOT medically stable for discharge given above active bleed and high risk for decompensation  Consultants:  Nephro, GI  Procedures:  None   Past Medical History:  Diagnosis Date   Anemia    Diabetes mellitus without complication (HCC)    patient denies   Dialysis patient (HCC)    End stage chronic kidney  disease (HCC)    Hypertension    ICH (intracerebral hemorrhage) (HCC) 05/20/2017   PAD (peripheral artery disease) (HCC)    Shoulder pain, left 06/28/2013    Past Surgical History:  Procedure Laterality Date   A/V FISTULAGRAM N/A 08/15/2020   Procedure: A/V FISTULAGRAM - Left Upper;  Surgeon: Leonie Douglas, MD;  [facility]: [facility] INVASIVE CV LAB;  Service: Cardiovascular;  Laterality: N/A;   ABDOMINAL AORTOGRAM W/LOWER EXTREMITY N/A 04/08/2021   Procedure: ABDOMINAL AORTOGRAM W/LOWER EXTREMITY;  Surgeon: Maeola Harman, MD;  Location: [facility] INVASIVE CV LAB;  Service: Cardiovascular;  Laterality: N/A;   AMPUTATION Left 04/16/2021   Procedure: LEFT BELOW KNEE AMPUTATION;  Surgeon: Chuck Hint, MD;  Location: [facility] OR;  Service: Vascular;  Laterality: Left;   AMPUTATION Left 06/17/2021   Procedure: REVISION AMPUTATION BELOW KNEE;  Surgeon: Victorino Sparrow, MD;  Location: [facility] OR;  Service: Vascular;  Laterality: Left;   APPENDECTOMY     APPLICATION OF WOUND VAC Left 06/17/2021   Procedure: APPLICATION OF WOUND VAC;  Surgeon: Victorino Sparrow, MD;  Location: [facility] OR;  Service: Vascular;  Laterality: Left;   AV FISTULA PLACEMENT Left 08/03/2019   Procedure: LEFT ARM ARTERIOVENOUS (AV) CEPHALIC  FISTULA CREATION;  Surgeon: Maeola Harman, MD;  Location: [facility] OR;  Service: Vascular;  Laterality: Left;   BIOPSY  06/30/2019   Procedure: BIOPSY;  Surgeon: Bernette Redbird, MD;  Location: [facility] ENDOSCOPY;  Service: Endoscopy;;   BIOPSY  08/02/2019   Procedure: BIOPSY;  Surgeon: Benancio Deeds, MD;  Location: [facility] ENDOSCOPY;  Service: Gastroenterology;;   BIOPSY  02/08/2021   Procedure: BIOPSY;  Surgeon: Imogene Burn, MD;  Location: [facility] ENDOSCOPY;  Service: Gastroenterology;;   BIOPSY  02/24/2021   Procedure: BIOPSY;  Surgeon: Jenel Lucks, MD;  Location: [facility] ENDOSCOPY;  Service: Gastroenterology;;   BIOPSY  03/26/2021   Procedure: BIOPSY;  Surgeon: Shellia Cleverly, DO;  Location: [facility] ENDOSCOPY;  Service: Gastroenterology;;   BIOPSY  08/06/2021   Procedure: BIOPSY;  Surgeon: Jenel Lucks, MD;  Location: [facility] ENDOSCOPY;  Service: Gastroenterology;;   BIOPSY  09/15/2021   Procedure: BIOPSY;  Surgeon: Imogene Burn, MD;  Location: [facility] ENDOSCOPY;  Service: Gastroenterology;;    BRONCHIAL BIOPSY  12/26/2021   Procedure: BRONCHIAL BIOPSIES;  Surgeon: Lupita Leash, MD;  Location: [facility] ENDOSCOPY;  Service: Cardiopulmonary;;   BRONCHIAL WASHINGS  12/26/2021   Procedure: BRONCHIAL WASHINGS;  Surgeon: Lupita Leash, MD;  Location: [facility] ENDOSCOPY;  Service: Cardiopulmonary;;   COLONOSCOPY  01/23/2012   Procedure: COLONOSCOPY;  Surgeon: West Bali, MD;  [facility] ENDO [facility];  Service: Endoscopy;  Laterality: N/A;  11:10 AM   COLONOSCOPY WITH PROPOFOL N/A 06/30/2019   Procedure: COLONOSCOPY WITH PROPOFOL;  Surgeon: Bernette Redbird, MD;  Location: [facility] ENDOSCOPY;  Service: Endoscopy;  Laterality: N/A;   ENTEROSCOPY N/A 08/02/2019   Procedure: ENTEROSCOPY;  Surgeon: Benancio Deeds, MD;  Location: [facility] ENDOSCOPY;  Service: Gastroenterology;  Laterality: N/A;   ENTEROSCOPY N/A 02/24/2021   Procedure: ENTEROSCOPY;  Surgeon: Jenel Lucks, MD;  Location: [facility] ENDOSCOPY;  Service: Gastroenterology;  Laterality: N/A;   ENTEROSCOPY N/A 03/26/2021   Procedure: ENTEROSCOPY;  Surgeon: Shellia Cleverly, DO;  Location: [facility] ENDOSCOPY;  Service: Gastroenterology;  Laterality: N/A;   ENTEROSCOPY N/A 07/05/2021   Procedure: ENTEROSCOPY;  Surgeon: Jeani Hawking, MD;  Location: [facility] ENDOSCOPY;  Service: Gastroenterology;  Laterality: N/A;   ENTEROSCOPY N/A 08/06/2021   Procedure: ENTEROSCOPY;  Surgeon: Tomasa Rand,  Dub Amis, MD;  Location: [facility] ENDOSCOPY;  Service: Gastroenterology;  Laterality: N/A;   ENTEROSCOPY N/A 08/24/2021   Procedure: ENTEROSCOPY;  Surgeon: Meryl Dare, MD;  Location: [facility] ENDOSCOPY;  Service: Gastroenterology;  Laterality: N/A;   ENTEROSCOPY N/A 09/15/2021   Procedure: ENTEROSCOPY;  Surgeon: Imogene Burn, MD;  Location: [facility] ENDOSCOPY;  Service: Gastroenterology;  Laterality: N/A;   ENTEROSCOPY N/A 10/06/2021   Procedure: ENTEROSCOPY;  Surgeon: Jenel Lucks, MD;  Location: [facility] ENDOSCOPY;  Service: Gastroenterology;  Laterality: N/A;   ENTEROSCOPY N/A  11/14/2021   Procedure: ENTEROSCOPY;  Surgeon: Sherrilyn Rist, MD;  Location: [facility] ENDOSCOPY;  Service: Gastroenterology;  Laterality: N/A;   ESOPHAGOGASTRODUODENOSCOPY N/A 08/10/2020   Procedure: ESOPHAGOGASTRODUODENOSCOPY (EGD);  Surgeon: Beverley Fiedler, MD;  Location: [facility] ENDOSCOPY;  Service: Gastroenterology;  Laterality: N/A;   ESOPHAGOGASTRODUODENOSCOPY (EGD) WITH PROPOFOL N/A 06/30/2019   Procedure: ESOPHAGOGASTRODUODENOSCOPY (EGD) WITH PROPOFOL;  Surgeon: Bernette Redbird, MD;  Location: [facility] ENDOSCOPY;  Service: Endoscopy;  Laterality: N/A;   ESOPHAGOGASTRODUODENOSCOPY (EGD) WITH PROPOFOL N/A 01/12/2021   Procedure: ESOPHAGOGASTRODUODENOSCOPY (EGD) WITH PROPOFOL;  Surgeon: Imogene Burn, MD;  Location: [facility] ENDOSCOPY;  Service: Gastroenterology;  Laterality: N/A;   ESOPHAGOGASTRODUODENOSCOPY (EGD) WITH PROPOFOL N/A 02/08/2021   Procedure: ESOPHAGOGASTRODUODENOSCOPY (EGD) WITH PROPOFOL;  Surgeon: Imogene Burn, MD;  Location: [facility] ENDOSCOPY;  Service: Gastroenterology;  Laterality: N/A;   FISTULA SUPERFICIALIZATION Left 10/17/2019   Procedure: LEFT UPPER EXTREMITY FISTULA REVISION, SIDE BRANCH LIGATION,  AND SUPERFICIALIZATION;  Surgeon: Cephus Shelling, MD;  Location: [facility] OR;  Service: Vascular;  Laterality: Left;   FISTULA SUPERFICIALIZATION Left 04/19/2021   Procedure: PLICATION OF ANEURYSM LEFT ARTERIOVENOUS FISTULA;  Surgeon: Chuck Hint, MD;  Location: [facility] OR;  Service: Vascular;  Laterality: Left;   FLEXIBLE SIGMOIDOSCOPY N/A 07/05/2021   Procedure: FLEXIBLE SIGMOIDOSCOPY;  Surgeon: Jeani Hawking, MD;  Location: [facility] ENDOSCOPY;  Service: Gastroenterology;  Laterality: N/A;   GIVENS CAPSULE STUDY N/A 06/30/2019   Procedure: GIVENS CAPSULE STUDY;  Surgeon: Bernette Redbird, MD;  Location: [facility] ENDOSCOPY;  Service: Endoscopy;  Laterality: N/A;   GIVENS CAPSULE STUDY N/A 08/25/2021   Procedure: GIVENS CAPSULE STUDY;  Surgeon: Meryl Dare, MD;  Location: [facility] ENDOSCOPY;  Service:  Gastroenterology;  Laterality: N/A;   GIVENS CAPSULE STUDY N/A 10/06/2021   Procedure: GIVENS CAPSULE STUDY;  Surgeon: Jenel Lucks, MD;  Location: [facility] ENDOSCOPY;  Service: Gastroenterology;  Laterality: N/A;   HEMOSTASIS CLIP PLACEMENT  08/10/2020   Procedure: HEMOSTASIS CLIP PLACEMENT;  Surgeon: Beverley Fiedler, MD;  Location: [facility] ENDOSCOPY;  Service: Gastroenterology;;   HEMOSTASIS CLIP PLACEMENT  01/12/2021   Procedure: HEMOSTASIS CLIP PLACEMENT;  Surgeon: Imogene Burn, MD;  Location: [facility] ENDOSCOPY;  Service: Gastroenterology;;   HEMOSTASIS CONTROL  08/02/2019   Procedure: HEMOSTASIS CONTROL;  Surgeon: Benancio Deeds, MD;  Location: [facility] ENDOSCOPY;  Service: Gastroenterology;;   HOT HEMOSTASIS  02/24/2021   Procedure: HOT HEMOSTASIS (ARGON PLASMA COAGULATION/BICAP);  Surgeon: Jenel Lucks, MD;  Location: [facility] ENDOSCOPY;  Service: Gastroenterology;;   HOT HEMOSTASIS N/A 03/26/2021   Procedure: HOT HEMOSTASIS (ARGON PLASMA COAGULATION/BICAP);  Surgeon: Shellia Cleverly, DO;  Location: [facility] ENDOSCOPY;  Service: Gastroenterology;  Laterality: N/A;   HOT HEMOSTASIS N/A 07/05/2021   Procedure: HOT HEMOSTASIS (ARGON PLASMA COAGULATION/BICAP);  Surgeon: Jeani Hawking, MD;  Location: [facility] ENDOSCOPY;  Service: Gastroenterology;  Laterality: N/A;   HOT HEMOSTASIS N/A 09/15/2021   Procedure: HOT HEMOSTASIS (ARGON PLASMA COAGULATION/BICAP);  Surgeon: Imogene Burn, MD;  Location: [facility] ENDOSCOPY;  Service: Gastroenterology;  Laterality: N/A;   HOT HEMOSTASIS N/A 10/06/2021   Procedure: HOT HEMOSTASIS (ARGON PLASMA COAGULATION/BICAP);  Surgeon: Jenel Lucks, MD;  Location: [facility] ENDOSCOPY;  Service: Gastroenterology;  Laterality: N/A;   INCISION AND DRAINAGE ABSCESS N/A 06/29/2016   Procedure: INCISION AND DRAINAGE ABDOMINAL WALL ABSCESS;  Surgeon: Ovidio Kin, MD;  Location: [facility] ORS;  Service: General;  Laterality: N/A;   INSERTION OF DIALYSIS CATHETER Right 08/03/2019   Procedure: INSERTION OF  DIALYSIS CATHETER;  Surgeon: Maeola Harman, MD;  Location: [facility] OR;  Service: Vascular;  Laterality: Right;   INSERTION OF DIALYSIS CATHETER Right 10/22/2019   Procedure: INSERTION OF 23CM TUNNELED DIALYSIS CATHETER RIGHT INTERNAL JUGULAR;  Surgeon: Chuck Hint, MD;  Location: [facility] OR;  Service: Vascular;  Laterality: Right;   INSERTION OF DIALYSIS CATHETER Right 08/12/2020   Procedure: INSERTION OF Right internal Jugular TUNNELED  DIALYSIS CATHETER.;  Surgeon: Maeola Harman, MD;  Location: [facility] OR;  Service: Vascular;  Laterality: Right;   INSERTION OF DIALYSIS CATHETER N/A 04/19/2021   Procedure: INSERTION OF TUNNELED DIALYSIS CATHETER;  Surgeon: Chuck Hint, MD;  Location: [facility] OR;  Service: Vascular;  Laterality: N/A;   IR 3D INDEPENDENT WKST  11/07/2021   IR ANGIOGRAM SELECTIVE EACH ADDITIONAL VESSEL  11/07/2021   IR ANGIOGRAM SELECTIVE EACH ADDITIONAL VESSEL  11/07/2021   IR ANGIOGRAM SELECTIVE EACH ADDITIONAL VESSEL  11/07/2021   IR ANGIOGRAM SELECTIVE EACH ADDITIONAL VESSEL  11/28/2021   IR ANGIOGRAM VISCERAL SELECTIVE  11/07/2021   IR ANGIOGRAM VISCERAL SELECTIVE  11/28/2021   IR EMBO ARTERIAL NOT HEMORR HEMANG INC GUIDE ROADMAPPING  11/07/2021   IR EMBO TUMOR ORGAN ISCHEMIA INFARCT INC GUIDE ROADMAPPING  11/28/2021   IR PARACENTESIS  02/20/2022   IR RADIOLOGIST EVAL & MGMT  09/12/2021   IR US GUIDE VASC ACCESS LEFT  11/07/2021   IR US GUIDE VASC ACCESS LEFT  11/28/2021   Left heel surgery     LOWER EXTREMITY ANGIOGRAPHY N/A 07/08/2021   Procedure: Lower Extremity Angiography;  Surgeon: Leonie Douglas, MD;  Location: [facility] INVASIVE CV LAB;  Service: Cardiovascular;  Laterality: N/A;   PENILE BIOPSY N/A 03/26/2020   Procedure: PENILE ULCER DEBRIDEMENT;  Surgeon: Belva Agee, MD;  Location: [facility] ORS;  Service: Urology;  Laterality: N/A;  30 MINS   PERIPHERAL VASCULAR INTERVENTION Right 07/08/2021   Procedure: PERIPHERAL VASCULAR INTERVENTION;  Surgeon: Leonie Douglas, MD;  Location: [facility] INVASIVE CV LAB;  Service: Cardiovascular;  Laterality: Right;   SCLEROTHERAPY  01/12/2021   Procedure: SCLEROTHERAPY;  Surgeon: Imogene Burn, MD;  Location: [facility] ENDOSCOPY;  Service: Gastroenterology;;   Susa Day  02/24/2021   Procedure: Susa Day;  Surgeon: Jenel Lucks, MD;  Location: [facility] ENDOSCOPY;  Service: Gastroenterology;;   SUBMUCOSAL TATTOO INJECTION  08/24/2021   Procedure: SUBMUCOSAL TATTOO INJECTION;  Surgeon: Meryl Dare, MD;  Location: [facility] ENDOSCOPY;  Service: Gastroenterology;;   SUBMUCOSAL TATTOO INJECTION  09/15/2021   Procedure: SUBMUCOSAL TATTOO INJECTION;  Surgeon: Imogene Burn, MD;  Location: [facility] ENDOSCOPY;  Service: Gastroenterology;;   VIDEO BRONCHOSCOPY N/A 12/26/2021   Procedure: VIDEO BRONCHOSCOPY WITH FLUORO;  Surgeon: Lupita Leash, MD;  Location: [facility] ENDOSCOPY;  Service: Cardiopulmonary;  Laterality: N/A;     reports that he has been smoking cigarettes. He has a 22.50 pack-year smoking history. He has never used smokeless tobacco. He reports that he does not currently use alcohol. He reports that he does not currently use drugs after having used the  following drugs: "Crack" cocaine.  Allergies  Allergen Reactions   Seroquel [Quetiapine] Other (See Comments)    Tardive kinesia Dystonia    Dilaudid [Hydromorphone Hcl] Itching and Other (See Comments)    Pt reports itchiness after IM injection     Family History  Problem Relation Age of Onset   Colon cancer Neg Hx     Prior to Admission medications   Medication Sig Start Date End Date Taking? Authorizing Provider  amLODipine (NORVASC) 5 MG tablet Take 1 tablet (5 mg total) by mouth daily. 04/01/22 06/30/22  Hoy Register, MD  atorvastatin (LIPITOR) 40 MG tablet Take 1 tablet (40 mg total) by mouth daily. 04/01/22 06/30/22  Hoy Register, MD  Calcium Carbonate Antacid (CALCIUM CARBONATE, DOSED IN MG ELEMENTAL CALCIUM,) 1250 MG/5ML SUSP Take 5 mLs (500 mg of  elemental calcium total) by mouth every 6 (six) hours as needed for indigestion. 03/29/22   Marinda Elk, MD  diclofenac Sodium (VOLTAREN) 1 % GEL Apply 2 g topically every 6 (six) hours as needed (apply on painful areas). 04/08/22   Burnadette Pop, MD  gabapentin (NEURONTIN) 300 MG capsule Take 300 mg by mouth 3 (three) times daily.    [provider]  loperamide (IMODIUM) 2 MG capsule Take 1 capsule (2 mg total) by mouth 4 (four) times daily as needed for diarrhea or loose stools. 01/31/22   Gerhard Munch, MD  nicotine (NICODERM CQ - DOSED IN MG/24 HOURS) 21 mg/24hr patch Place 1 patch (21 mg total) onto the skin daily. 04/09/22   Burnadette Pop, MD  pantoprazole (PROTONIX) 40 MG tablet Take 40 mg by mouth 2 (two) times daily before a meal.    [provider]  traZODone (DESYREL) 50 MG tablet Take 1 tablet (50 mg total) by mouth daily as needed for sleep. 02/10/22   Grayce Sessions, NP  colchicine 0.6 MG tablet Take 0.5 tablets (0.3 mg total) by mouth 2 (two) times daily. 07/24/20 07/29/20  Eber Hong, MD  furosemide (LASIX) 40 MG tablet Take 1 tablet (40 mg total) by mouth daily. 07/25/19 02/09/20  Thom Chimes, MD    Physical Exam: Vitals:   04/14/22 1308 04/14/22 1415 04/14/22 1430 04/14/22 1445  BP: 115/85 121/66  125/87  Pulse: (!) 102 98 (!) 102 98  Resp: 18  17 15   Temp: (!) 97.5 F (36.4 C)     TempSrc: Oral     SpO2: 96% 97% 92% 93%    Constitutional: NAD, calm, comfortable Vitals:   04/14/22 1308 04/14/22 1415 04/14/22 1430 04/14/22 1445  BP: 115/85 121/66  125/87  Pulse: (!) 102 98 (!) 102 98  Resp: 18  17 15   Temp: (!) 97.5 F (36.4 C)     TempSrc: Oral     SpO2: 96% 97% 92% 93%   General:  Pleasantly resting in bed, No acute distress. HEENT:  Normocephalic atraumatic.  Sclerae nonicteric, noninjected.  Extraocular movements intact bilaterally. Neck:  Without mass or deformity.  Trachea is midline. Lungs:  Clear to auscultate  bilaterally without rhonchi, wheeze, or rales. Heart:  Regular rate and rhythm.  Without murmurs, rubs, or gallops. Abdomen:  Soft, nontender, nondistended.  Without guarding or rebound. Extremities: Without cyanosis, clubbing, edema, or obvious deformity. Vascular:  Dorsalis pedis and posterior tibial pulses palpable bilaterally. Skin:  Warm and dry, no erythema, no ulcerations.  Labs on Admission: I have personally reviewed following labs and imaging studies  CBC: Recent Labs  Lab 04/08/22 0414 04/10/22 0348 04/13/22  2033 04/14/22 1323  WBC 3.9* 4.0 4.6 5.8  NEUTROABS  --  2.3  --  4.3  HGB 8.0* 8.4* 7.3* 5.7*  HCT 24.5* 24.3* 22.4* 17.4*  MCV 100.0 98.4 103.2* 102.4*  PLT 121* 131* 117* 123*   Basic Metabolic Panel: Recent Labs  Lab 04/08/22 0414 04/10/22 0348 04/13/22 2033 04/14/22 1323  NA 130* 126* 128* PENDING  K 4.1 4.8 4.7 PENDING  CL 94* 90* 92* PENDING  CO2 27 23 21* PENDING  GLUCOSE 99 102* 112* PENDING  BUN 32* 42* 60* PENDING  CREATININE 6.39* 8.62* 8.79* PENDING  CALCIUM 8.3* 8.4* 8.4* PENDING   GFR: CrCl cannot be calculated (This lab value cannot be used to calculate CrCl because it is not a number: PENDING). Liver Function Tests: Recent Labs  Lab 04/10/22 0348  AST 125*  ALT 41  ALKPHOS 101  BILITOT 1.0  PROT 8.9*  ALBUMIN 1.9*   No results for input(s): "LIPASE", "AMYLASE" in the last 168 hours. No results for input(s): "AMMONIA" in the last 168 hours. Coagulation Profile: No results for input(s): "INR", "PROTIME" in the last 168 hours. Cardiac Enzymes: No results for input(s): "CKTOTAL", "CKMB", "CKMBINDEX", "TROPONINI" in the last 168 hours. BNP (last 3 results) No results for input(s): "PROBNP" in the last 8760 hours. HbA1C: No results for input(s): "HGBA1C" in the last 72 hours. CBG: Recent Labs  Lab 04/07/22 1808 04/07/22 2358 04/08/22 0859  GLUCAP 105* 131* 92   Lipid Profile: No results for input(s): "CHOL", "HDL",  "LDLCALC", "TRIG", "CHOLHDL", "LDLDIRECT" in the last 72 hours. Thyroid Function Tests: No results for input(s): "TSH", "T4TOTAL", "FREET4", "T3FREE", "THYROIDAB" in the last 72 hours. Anemia Panel: No results for input(s): "VITAMINB12", "FOLATE", "FERRITIN", "TIBC", "IRON", "RETICCTPCT" in the last 72 hours. Urine analysis:    Component Value Date/Time   COLORURINE YELLOW 12/09/2021 0900   APPEARANCEUR CLEAR 12/09/2021 0900   LABSPEC 1.008 12/09/2021 0900   PHURINE 5.0 12/09/2021 0900   GLUCOSEU NEGATIVE 12/09/2021 0900   HGBUR MODERATE (A) 12/09/2021 0900   BILIRUBINUR NEGATIVE 12/09/2021 0900   KETONESUR NEGATIVE 12/09/2021 0900   PROTEINUR 100 (A) 12/09/2021 0900   UROBILINOGEN 0.2 12/31/2009 0746   NITRITE NEGATIVE 12/09/2021 0900   LEUKOCYTESUR NEGATIVE 12/09/2021 0900    Radiological Exams on Admission: DG Chest Port 1 View  Result Date: 04/14/2022 CLINICAL DATA:  Cough, COVID-19 positive. EXAM: PORTABLE CHEST 1 VIEW COMPARISON:  April 10, 2022. FINDINGS: Stable cardiomegaly. Stable bilateral upper lobe airspace opacities are noted, left greater than right, consistent with pneumonia. Stable bibasilar atelectasis or infiltrates are noted with associated pleural effusions. Bony thorax is unremarkable. IMPRESSION: Stable bilateral lung opacities and pleural effusions as described above. Electronically Signed   By: Lupita Raider M.D.   On: 04/14/2022 13:25    EKG: Independently reviewed. Sinus tachycardia   Azucena Fallen DO [facility] For contact please use secure messenger on Epic  If 7PM-7AM, please contact night-coverage located on [email or web address]   04/14/2022, 3:33 PM

## 2022-04-14 NOTE — ED Triage Notes (Signed)
Pt BIB [facility] from home c/o generalized body pain, cough, N/V.

## 2022-04-14 NOTE — H&P (Incomplete Revision)
History and Physical    FREDICK SCHLOSSER ZOX:[id number] DOB: 22-Sep-1960 DOA: 04/14/2022  PCP: [name], NP   Chief Complaint: Symptomatic anemia  HPI: Daniel [name] is a 61 y.o. male well known to the hospital system having presented eight times to our ED in the last month with medical history significant of end-stage renal disease on hemodialysis Tuesday Thursday Saturdays, diabetes mellitus type 2, ICH in 2019, PAD status post left BKA .chronic anemia, history of recurrent GI bleed with 2 duodenal ulcers/AVM, alcoholic cirrhosis with hepatocellular carcinoma right upper lung mass chronic back pain.  Patient presents today with symptomatic anemia, generally lethargic and tired, patient has also missed dialysis multiple times this past week due to transportation issues per report - last reported outpatient HD 04/07/22 a week prior to admission - received urgent HD here on 12/14 while in the ED.  ED Course: Presents with melena/BRBPR weakness and lethargy, hemoglobin noted to be 5.7 at intake down from his baseline around 7.5 to 8. GI consulted given history of recurrent GI bleeds requiring procedures in the past.  Nephrology consulted given need for dialysis. Hospitalist called for admission.  Review of Systems: As per HPI lethargy, weakness.  Denies nausea vomiting diarrhea constipation headache fevers chills or chest pain.  Assessment/Plan Active Problems:   Acute blood loss anemia   Alcohol abuse   ESRD (end stage renal disease) on dialysis (HCC)   Thrombocytopenia (HCC)   Peptic ulcer disease   Diabetes mellitus type 2, controlled, with complications ([place])   Physical deconditioning   Iron deficiency anemia due to chronic blood loss   Acute on chronic anemia   Cirrhosis of liver with ascites (HCC)   Symptomatic anemia  **Acute hypoxic respiratory failure/acute metabolic encephalopathy, POA Rule out acute alcohol withdrawal and subsequent seizure -After admission  while still in the ED patient had a large volume bloody emesis and questionable seizure-like activity. -Intubated emergently in the ED - PCCM consulted to evaluate - appreciate their assistance.  Acute symptomatic anemia likely to acute blood loss anemia on chronic anemia of chronic disease and chronic blood loss -GI following, appreciate insight recommendations, multiple endoscopy procedures this year per their documentation -Continue transfusion, unclear if patient will need recurrent endoscopy during this hospitalization -Likely exacerbated by ESRD and dialysis with chronic anemia around 7.5-8  COVID-positive, without acute hypoxic respiratory failure or GI symptoms Likely incidental finding -COVID-positive via swab from prior ED presentation, patient is not hypoxic without fevers chills or leukocytosis -This is likely an incidental finding however will quarantine patient for 5 days per protocol -No indication for treatment at this time  ESRD, MWF -Nephrology to see, labs appear stable  Profound noncompliance -Multiple admissions/missed dialysis and questionable medication compliance, lengthy discussion with pati  Alcohol use and abuse disorder, high risk for alcohol withdrawal syndrome -Monitor closely - etoh level negative - high risk for withdrawals given unknown last drink. Likely >24h ago at this point. -Start CIWA protocol  DVT prophylaxis: None give active bleed as above  Code Status: Full  Family Communication: None present  Status is: Inpt  Dispo: The patient is from: Home              Anticipated d/c is to: TBD              Anticipated d/c date is: 72+h              Patient currently NOT medically stable for discharge given above active bleed and high  risk for decompensation  Consultants:  Nephro, GI  Procedures:  None   Past Medical History:  Diagnosis Date   Anemia    Diabetes mellitus without complication [facility])    patient denies   Dialysis patient [facility])     End stage chronic kidney disease ([place])    Hypertension    ICH (intracerebral hemorrhage) ([place]) 05/20/2017   PAD (peripheral artery disease) (HCC)    Shoulder pain, left 06/28/2013    Past Surgical History:  Procedure Laterality Date   A/V FISTULAGRAM N/A 08/15/2020   Procedure: A/V FISTULAGRAM - Left Upper;  Surgeon: Cherre Robins, MD;  Location: [place] CV LAB;  Service: Cardiovascular;  Laterality: N/A;   ABDOMINAL AORTOGRAM W/LOWER EXTREMITY N/A 04/08/2021   Procedure: ABDOMINAL AORTOGRAM W/LOWER EXTREMITY;  Surgeon: Waynetta Sandy, MD;  Location: [place] CV LAB;  Service: Cardiovascular;  Laterality: N/A;   AMPUTATION Left 04/16/2021   Procedure: LEFT BELOW KNEE AMPUTATION;  Surgeon: Angelia Mould, MD;  Location: [place];  Service: Vascular;  Laterality: Left;   AMPUTATION Left 06/17/2021   Procedure: REVISION AMPUTATION BELOW KNEE;  Surgeon: Broadus John, MD;  Location: [place];  Service: Vascular;  Laterality: Left;   APPENDECTOMY     APPLICATION OF WOUND VAC Left 06/17/2021   Procedure: APPLICATION OF WOUND VAC;  Surgeon: Broadus John, MD;  Location: [place];  Service: Vascular;  Laterality: Left;   AV FISTULA PLACEMENT Left 08/03/2019   Procedure: LEFT ARM ARTERIOVENOUS (AV) CEPHALIC  FISTULA CREATION;  Surgeon: Waynetta Sandy, MD;  Location: [place];  Service: Vascular;  Laterality: Left;   BIOPSY  06/30/2019   Procedure: BIOPSY;  Surgeon: Ronald Lobo, MD;  Location: [place] ENDOSCOPY;  Service: Endoscopy;;   BIOPSY  08/02/2019   Procedure: BIOPSY;  Surgeon: Yetta Flock, MD;  Location: [facility] ENDOSCOPY;  Service: Gastroenterology;;   BIOPSY  02/08/2021   Procedure: BIOPSY;  Surgeon: Sharyn Creamer, MD;  Location: [facility] ENDOSCOPY;  Service: Gastroenterology;;   BIOPSY  02/24/2021   Procedure: BIOPSY;  Surgeon: [name], MD;  Location: [facility] ENDOSCOPY;  Service: Gastroenterology;;   BIOPSY  03/26/2021   Procedure: BIOPSY;  Surgeon:  Lavena Bullion, DO;  Location: [place];  Service: Gastroenterology;;   BIOPSY  08/06/2021   Procedure: BIOPSY;  Surgeon: [name], MD;  Location: [place];  Service: Gastroenterology;;   BIOPSY  09/15/2021   Procedure: BIOPSY;  Surgeon: Sharyn Creamer, MD;  Location: [place];  Service: Gastroenterology;;   BRONCHIAL BIOPSY  12/26/2021   Procedure: BRONCHIAL BIOPSIES;  Surgeon: Juanito Doom, MD;  Location: [place];  Service: Cardiopulmonary;;   BRONCHIAL WASHINGS  12/26/2021   Procedure: BRONCHIAL WASHINGS;  Surgeon: Juanito Doom, MD;  Location: [place];  Service: Cardiopulmonary;;   COLONOSCOPY  01/23/2012   Procedure: COLONOSCOPY;  Surgeon: Danie Binder, MD;  [facility];  Service: Endoscopy;  Laterality: N/A;  11:10 AM   COLONOSCOPY WITH PROPOFOL N/A 06/30/2019   Procedure: COLONOSCOPY WITH PROPOFOL;  Surgeon: Ronald Lobo, MD;  Location: [place];  Service: Endoscopy;  Laterality: N/A;   ENTEROSCOPY N/A 08/02/2019   Procedure: ENTEROSCOPY;  Surgeon: Yetta Flock, MD;  Location: [facility] ENDOSCOPY;  Service: Gastroenterology;  Laterality: N/A;   ENTEROSCOPY N/A 02/24/2021   Procedure: ENTEROSCOPY;  Surgeon: [name], MD;  Location: [facility] ENDOSCOPY;  Service: Gastroenterology;  Laterality: N/A;   ENTEROSCOPY N/A 03/26/2021   Procedure: ENTEROSCOPY;  Surgeon: Gerrit Heck  V, DO;  Location: [place];  Service: Gastroenterology;  Laterality: N/A;   ENTEROSCOPY N/A 07/05/2021   Procedure: ENTEROSCOPY;  Surgeon: Carol Ada, MD;  Location: [place];  Service: Gastroenterology;  Laterality: N/A;   ENTEROSCOPY N/A 08/06/2021   Procedure: ENTEROSCOPY;  Surgeon: [name], MD;  Location: [facility] ENDOSCOPY;  Service: Gastroenterology;  Laterality: N/A;   ENTEROSCOPY N/A 08/24/2021   Procedure: ENTEROSCOPY;  Surgeon: Ladene Artist, MD;  Location: [facility] ENDOSCOPY;  Service: Gastroenterology;  Laterality: N/A;    ENTEROSCOPY N/A 09/15/2021   Procedure: ENTEROSCOPY;  Surgeon: Sharyn Creamer, MD;  Location: [facility] ENDOSCOPY;  Service: Gastroenterology;  Laterality: N/A;   ENTEROSCOPY N/A 10/06/2021   Procedure: ENTEROSCOPY;  Surgeon: [name], MD;  Location: [facility] ENDOSCOPY;  Service: Gastroenterology;  Laterality: N/A;   ENTEROSCOPY N/A 11/14/2021   Procedure: ENTEROSCOPY;  Surgeon: Doran Stabler, MD;  Location: [facility] ENDOSCOPY;  Service: Gastroenterology;  Laterality: N/A;   ESOPHAGOGASTRODUODENOSCOPY N/A 08/10/2020   Procedure: ESOPHAGOGASTRODUODENOSCOPY (EGD);  Surgeon: Jerene Bears, MD;  Location: [facility] ENDOSCOPY;  Service: Gastroenterology;  Laterality: N/A;   ESOPHAGOGASTRODUODENOSCOPY (EGD) WITH PROPOFOL N/A 06/30/2019   Procedure: ESOPHAGOGASTRODUODENOSCOPY (EGD) WITH PROPOFOL;  Surgeon: Ronald Lobo, MD;  Location: [place];  Service: Endoscopy;  Laterality: N/A;   ESOPHAGOGASTRODUODENOSCOPY (EGD) WITH PROPOFOL N/A 01/12/2021   Procedure: ESOPHAGOGASTRODUODENOSCOPY (EGD) WITH PROPOFOL;  Surgeon: Sharyn Creamer, MD;  Location: [place];  Service: Gastroenterology;  Laterality: N/A;   ESOPHAGOGASTRODUODENOSCOPY (EGD) WITH PROPOFOL N/A 02/08/2021   Procedure: ESOPHAGOGASTRODUODENOSCOPY (EGD) WITH PROPOFOL;  Surgeon: Sharyn Creamer, MD;  Location: [place];  Service: Gastroenterology;  Laterality: N/A;   FISTULA SUPERFICIALIZATION Left 10/17/2019   Procedure: LEFT UPPER EXTREMITY FISTULA REVISION, SIDE BRANCH LIGATION,  AND SUPERFICIALIZATION;  Surgeon: Marty Heck, MD;  Location: [place];  Service: Vascular;  Laterality: Left;   FISTULA SUPERFICIALIZATION Left 35/57/3220   Procedure: PLICATION OF ANEURYSM LEFT ARTERIOVENOUS FISTULA;  Surgeon: Angelia Mould, MD;  Location: [place];  Service: Vascular;  Laterality: Left;   FLEXIBLE SIGMOIDOSCOPY N/A 07/05/2021   Procedure: FLEXIBLE SIGMOIDOSCOPY;  Surgeon: Carol Ada, MD;  Location: [place];  Service: Gastroenterology;   Laterality: N/A;   GIVENS CAPSULE STUDY N/A 06/30/2019   Procedure: GIVENS CAPSULE STUDY;  Surgeon: Ronald Lobo, MD;  Location: [place];  Service: Endoscopy;  Laterality: N/A;   GIVENS CAPSULE STUDY N/A 08/25/2021   Procedure: GIVENS CAPSULE STUDY;  Surgeon: Ladene Artist, MD;  Location: [place];  Service: Gastroenterology;  Laterality: N/A;   GIVENS CAPSULE STUDY N/A 10/06/2021   Procedure: GIVENS CAPSULE STUDY;  Surgeon: [name], MD;  Location: [place];  Service: Gastroenterology;  Laterality: N/A;   HEMOSTASIS CLIP PLACEMENT  08/10/2020   Procedure: HEMOSTASIS CLIP PLACEMENT;  Surgeon: Jerene Bears, MD;  Location: [place];  Service: Gastroenterology;;   HEMOSTASIS CLIP PLACEMENT  01/12/2021   Procedure: HEMOSTASIS CLIP PLACEMENT;  Surgeon: Sharyn Creamer, MD;  Location: [place];  Service: Gastroenterology;;   HEMOSTASIS CONTROL  08/02/2019   Procedure: HEMOSTASIS CONTROL;  Surgeon: Yetta Flock, MD;  Location: [place];  Service: Gastroenterology;;   HOT HEMOSTASIS  02/24/2021   Procedure: HOT HEMOSTASIS (ARGON PLASMA COAGULATION/BICAP);  Surgeon: [name], MD;  Location: [facility] ENDOSCOPY;  Service: Gastroenterology;;   HOT HEMOSTASIS N/A 03/26/2021   Procedure: HOT HEMOSTASIS (ARGON PLASMA COAGULATION/BICAP);  Surgeon: Lavena Bullion, DO;  Location: [facility] ENDOSCOPY;  Service: Gastroenterology;  Laterality: N/A;   HOT HEMOSTASIS N/A 07/05/2021  Procedure: HOT HEMOSTASIS (ARGON PLASMA COAGULATION/BICAP);  Surgeon: Carol Ada, MD;  Location: [place];  Service: Gastroenterology;  Laterality: N/A;   HOT HEMOSTASIS N/A 09/15/2021   Procedure: HOT HEMOSTASIS (ARGON PLASMA COAGULATION/BICAP);  Surgeon: Sharyn Creamer, MD;  Location: [place];  Service: Gastroenterology;  Laterality: N/A;   HOT HEMOSTASIS N/A 10/06/2021   Procedure: HOT HEMOSTASIS (ARGON PLASMA COAGULATION/BICAP);  Surgeon: [name], MD;  Location: [place];  Service: Gastroenterology;  Laterality: N/A;   INCISION AND DRAINAGE ABSCESS N/A 06/29/2016   Procedure: INCISION AND DRAINAGE ABDOMINAL WALL ABSCESS;  Surgeon: Alphonsa Overall, MD;  Location: [facility];  Service: General;  Laterality: N/A;   INSERTION OF DIALYSIS CATHETER Right 08/03/2019   Procedure: INSERTION OF DIALYSIS CATHETER;  Surgeon: Waynetta Sandy, MD;  Location: [place];  Service: Vascular;  Laterality: Right;   INSERTION OF DIALYSIS CATHETER Right 10/22/2019   Procedure: INSERTION OF 23CM TUNNELED DIALYSIS CATHETER RIGHT INTERNAL JUGULAR;  Surgeon: Angelia Mould, MD;  Location: [place];  Service: Vascular;  Laterality: Right;   INSERTION OF DIALYSIS CATHETER Right 08/12/2020   Procedure: INSERTION OF Right internal Jugular TUNNELED  DIALYSIS CATHETER.;  Surgeon: Waynetta Sandy, MD;  Location: [place];  Service: Vascular;  Laterality: Right;   INSERTION OF DIALYSIS CATHETER N/A 04/19/2021   Procedure: INSERTION OF TUNNELED DIALYSIS CATHETER;  Surgeon: Angelia Mould, MD;  Location: [place];  Service: Vascular;  Laterality: N/A;   IR 3D INDEPENDENT WKST  11/07/2021   IR ANGIOGRAM SELECTIVE EACH ADDITIONAL VESSEL  11/07/2021   IR ANGIOGRAM SELECTIVE EACH ADDITIONAL VESSEL  11/07/2021   IR ANGIOGRAM SELECTIVE EACH ADDITIONAL VESSEL  11/07/2021   IR ANGIOGRAM SELECTIVE EACH ADDITIONAL VESSEL  11/28/2021   IR ANGIOGRAM VISCERAL SELECTIVE  11/07/2021   IR ANGIOGRAM VISCERAL SELECTIVE  11/28/2021   IR EMBO ARTERIAL NOT HEMORR HEMANG INC GUIDE ROADMAPPING  11/07/2021   IR EMBO TUMOR ORGAN ISCHEMIA INFARCT INC GUIDE ROADMAPPING  11/28/2021   IR PARACENTESIS  02/20/2022   IR RADIOLOGIST EVAL & MGMT  09/12/2021   IR US GUIDE VASC ACCESS LEFT  11/07/2021   IR US GUIDE VASC ACCESS LEFT  11/28/2021   Left heel surgery     LOWER EXTREMITY ANGIOGRAPHY N/A 07/08/2021   Procedure: Lower Extremity Angiography;  Surgeon: Cherre Robins, MD;  Location: [place] CV LAB;  Service:  Cardiovascular;  Laterality: N/A;   PENILE BIOPSY N/A 03/26/2020   Procedure: PENILE ULCER DEBRIDEMENT;  Surgeon: Remi Haggard, MD;  Location: [facility];  Service: Urology;  Laterality: N/A;  30 MINS   PERIPHERAL VASCULAR INTERVENTION Right 07/08/2021   Procedure: PERIPHERAL VASCULAR INTERVENTION;  Surgeon: Cherre Robins, MD;  Location: [place] CV LAB;  Service: Cardiovascular;  Laterality: Right;   SCLEROTHERAPY  01/12/2021   Procedure: SCLEROTHERAPY;  Surgeon: Sharyn Creamer, MD;  Location: [facility] ENDOSCOPY;  Service: Gastroenterology;;   Clide Deutscher  02/24/2021   Procedure: Clide Deutscher;  Surgeon: [name], MD;  Location: [place];  Service: Gastroenterology;;   SUBMUCOSAL TATTOO INJECTION  08/24/2021   Procedure: SUBMUCOSAL TATTOO INJECTION;  Surgeon: Ladene Artist, MD;  Location: [place];  Service: Gastroenterology;;   SUBMUCOSAL TATTOO INJECTION  09/15/2021   Procedure: SUBMUCOSAL TATTOO INJECTION;  Surgeon: Sharyn Creamer, MD;  Location: [place];  Service: Gastroenterology;;   VIDEO BRONCHOSCOPY N/A 12/26/2021   Procedure: VIDEO BRONCHOSCOPY WITH FLUORO;  Surgeon: Juanito Doom, MD;  Location: [place];  Service: Cardiopulmonary;  Laterality: N/A;  reports that he has been smoking cigarettes. He has a 22.50 pack-year smoking history. He has never used smokeless tobacco. He reports that he does not currently use alcohol. He reports that he does not currently use drugs after having used the following drugs: "Crack" cocaine.  Allergies  Allergen Reactions   Seroquel [Quetiapine] Other (See Comments)    Tardive kinesia Dystonia    Dilaudid [Hydromorphone Hcl] Itching and Other (See Comments)    Pt reports itchiness after IM injection     Family History  Problem Relation Age of Onset   Colon cancer Neg Hx     Prior to Admission medications   Medication Sig Start Date End Date Taking? Authorizing Provider  amLODipine (NORVASC) 5 MG tablet  Take 1 tablet (5 mg total) by mouth daily. 04/01/22 06/30/22  Charlott Rakes, MD  atorvastatin (LIPITOR) 40 MG tablet Take 1 tablet (40 mg total) by mouth daily. 04/01/22 06/30/22  Charlott Rakes, MD  Calcium Carbonate Antacid (CALCIUM CARBONATE, DOSED IN MG ELEMENTAL CALCIUM,) 1250 MG/5ML SUSP Take 5 mLs (500 mg of elemental calcium total) by mouth every 6 (six) hours as needed for indigestion. 03/29/22   Charlynne Cousins, MD  diclofenac Sodium (VOLTAREN) 1 % GEL Apply 2 g topically every 6 (six) hours as needed (apply on painful areas). 04/08/22   Shelly Coss, MD  gabapentin (NEURONTIN) 300 MG capsule Take 300 mg by mouth 3 (three) times daily.    [provider]  loperamide (IMODIUM) 2 MG capsule Take 1 capsule (2 mg total) by mouth 4 (four) times daily as needed for diarrhea or loose stools. 01/31/22   Carmin Muskrat, MD  nicotine (NICODERM CQ - DOSED IN MG/24 HOURS) 21 mg/24hr patch Place 1 patch (21 mg total) onto the skin daily. 04/09/22   Shelly Coss, MD  pantoprazole (PROTONIX) 40 MG tablet Take 40 mg by mouth 2 (two) times daily before a meal.    [provider]  traZODone (DESYREL) 50 MG tablet Take 1 tablet (50 mg total) by mouth daily as needed for sleep. 02/10/22   [name], NP  colchicine 0.6 MG tablet Take 0.5 tablets (0.3 mg total) by mouth 2 (two) times daily. 07/24/20 07/29/20  Noemi Chapel, MD  furosemide (LASIX) 40 MG tablet Take 1 tablet (40 mg total) by mouth daily. 07/25/19 02/09/20  Ladona Horns, MD    Physical Exam: Vitals:   04/14/22 1308 04/14/22 1415 04/14/22 1430 04/14/22 1445  BP: 115/85 121/66  125/87  Pulse: (!) 102 98 (!) 102 98  Resp: '18  17 15  '$ Temp: (!) 97.5 F (36.4 C)     TempSrc: Oral     SpO2: 96% 97% 92% 93%    Constitutional: NAD, calm, comfortable Vitals:   04/14/22 1308 04/14/22 1415 04/14/22 1430 04/14/22 1445  BP: 115/85 121/66  125/87  Pulse: (!) 102 98 (!) 102 98  Resp: '18  17 15  '$ Temp: (!) 97.5 F (36.4  C)     TempSrc: Oral     SpO2: 96% 97% 92% 93%   General:  Pleasantly resting in bed, No acute distress. HEENT:  Normocephalic atraumatic.  Sclerae nonicteric, noninjected.  Extraocular movements intact bilaterally. Neck:  Without mass or deformity.  Trachea is midline. Lungs:  Clear to auscultate bilaterally without rhonchi, wheeze, or rales. Heart:  Regular rate and rhythm.  Without murmurs, rubs, or gallops. Abdomen:  Soft, nontender, nondistended.  Without guarding or rebound. Extremities: Without cyanosis, clubbing, edema, or obvious deformity. Vascular:  Dorsalis pedis and posterior tibial pulses palpable bilaterally. Skin:  Warm and dry, no erythema, no ulcerations.  Labs on Admission: I have personally reviewed following labs and imaging studies  CBC: Recent Labs  Lab 04/08/22 0414 04/10/22 0348 04/13/22 2033 04/14/22 1323  WBC 3.9* 4.0 4.6 5.8  NEUTROABS  --  2.3  --  4.3  HGB 8.0* 8.4* 7.3* 5.7*  HCT 24.5* 24.3* 22.4* 17.4*  MCV 100.0 98.4 103.2* 102.4*  PLT 121* 131* 117* 778*   Basic Metabolic Panel: Recent Labs  Lab 04/08/22 0414 04/10/22 0348 04/13/22 2033 04/14/22 1323  NA 130* 126* 128* PENDING  K 4.1 4.8 4.7 PENDING  CL 94* 90* 92* PENDING  CO2 27 23 21* PENDING  GLUCOSE 99 102* 112* PENDING  BUN 32* 42* 60* PENDING  CREATININE 6.39* 8.62* 8.79* PENDING  CALCIUM 8.3* 8.4* 8.4* PENDING   GFR: CrCl cannot be calculated (This lab value cannot be used to calculate CrCl because it is not a number: PENDING). Liver Function Tests: Recent Labs  Lab 04/10/22 0348  AST 125*  ALT 41  ALKPHOS 101  BILITOT 1.0  PROT 8.9*  ALBUMIN 1.9*   No results for input(s): "LIPASE", "AMYLASE" in the last 168 hours. No results for input(s): "AMMONIA" in the last 168 hours. Coagulation Profile: No results for input(s): "INR", "PROTIME" in the last 168 hours. Cardiac Enzymes: No results for input(s): "CKTOTAL", "CKMB", "CKMBINDEX", "TROPONINI" in the last 168  hours. BNP (last 3 results) No results for input(s): "PROBNP" in the last 8760 hours. HbA1C: No results for input(s): "HGBA1C" in the last 72 hours. CBG: Recent Labs  Lab 04/07/22 1808 04/07/22 2358 04/08/22 0859  GLUCAP 105* 131* 92   Lipid Profile: No results for input(s): "CHOL", "HDL", "LDLCALC", "TRIG", "CHOLHDL", "LDLDIRECT" in the last 72 hours. Thyroid Function Tests: No results for input(s): "TSH", "T4TOTAL", "FREET4", "T3FREE", "THYROIDAB" in the last 72 hours. Anemia Panel: No results for input(s): "VITAMINB12", "FOLATE", "FERRITIN", "TIBC", "IRON", "RETICCTPCT" in the last 72 hours. Urine analysis:    Component Value Date/Time   COLORURINE YELLOW 12/09/2021 0900   APPEARANCEUR CLEAR 12/09/2021 0900   LABSPEC 1.008 12/09/2021 0900   PHURINE 5.0 12/09/2021 0900   GLUCOSEU NEGATIVE 12/09/2021 0900   HGBUR MODERATE (A) 12/09/2021 0900   BILIRUBINUR NEGATIVE 12/09/2021 0900   KETONESUR NEGATIVE 12/09/2021 0900   PROTEINUR 100 (A) 12/09/2021 0900   UROBILINOGEN 0.2 12/31/2009 0746   NITRITE NEGATIVE 12/09/2021 0900   LEUKOCYTESUR NEGATIVE 12/09/2021 0900    Radiological Exams on Admission: DG Chest Port 1 View  Result Date: 04/14/2022 CLINICAL DATA:  Cough, COVID-19 positive. EXAM: PORTABLE CHEST 1 VIEW COMPARISON:  April 10, 2022. FINDINGS: Stable cardiomegaly. Stable bilateral upper lobe airspace opacities are noted, left greater than right, consistent with pneumonia. Stable bibasilar atelectasis or infiltrates are noted with associated pleural effusions. Bony thorax is unremarkable. IMPRESSION: Stable bilateral lung opacities and pleural effusions as described above. Electronically Signed   By: Marijo Conception M.D.   On: 04/14/2022 13:25    EKG: Independently reviewed. Sinus tachycardia   Little Ishikawa DO [facility] For contact please use secure messenger on Epic  If 7PM-7AM, please contact night-coverage located on  [email or web address]   04/14/2022, 3:33 PM

## 2022-04-14 NOTE — Progress Notes (Signed)
Not available for EEG at the moment. In process of moving to [place] per nurse.  Will check back

## 2022-04-14 NOTE — Telephone Encounter (Signed)
Transition Care Management Follow-up Telephone Call Date of discharge and from where: 04/08/2022, [facility] He returned to the ED 04/10/2022, 04/13/2022 and he returned to the ED today and is currently there

## 2022-04-14 NOTE — ED Provider Triage Note (Addendum)
Emergency Medicine Provider Triage Evaluation Note  Daniel [name] , a 61 y.o. male  was evaluated in triage.  Pt complains of generalized bodyaches.  Patient was seen here in emergency department yesterday, had positive COVID test and lab work done.  He has missed dialysis.  Presents again by EMS.  He is not cooperative with his history, rocking back and forth saying "oh man".  Review of Systems  Positive: Body aches, cough Negative:   Physical Exam  SpO2 98%  Gen:   Awake, rocking back and forth moaning Resp:  Normal effort, no crackles MSK:   Moves extremities without difficulty  Other:    Medical Decision Making  Medically screening exam initiated at 1:02 PM.  Appropriate orders placed.  Chayson C [place] was informed that the remainder of the evaluation will be completed by another provider, this initial triage assessment does not replace that evaluation, and the importance of remaining in the ED until their evaluation is complete.  1:37 PM Patient had BM, approximately 1/2c dark blood on floor in triage room. Added type and screen.     Carlisle Cater, PA-C 04/14/22 1340

## 2022-04-14 NOTE — Consult Note (Signed)
Attending physician's note   I have reviewed the chart and discussed his care extensively on rounds. I performed a substantive portion of this encounter, including complete performance of at least one of the key components, in conjunction with the APP. I agree with the APP's note, impression, and recommendations with my edits.   61 year old male with medical history as outlined below including history of recurrent GI bleed from AVMs, requiring multiple hospitalizations in the past with blood transfusions.  Has been treated with octreotide in the past, but has been unsuccessful in obtaining LAR as outpatient due to insurance.  Most recent hospitalization was 12/10-12 for respiratory failure and was transfused 1 unit PRBCs for hemoglobin nadir 6.8.  Has had an extensive endoscopic evaluation as thoroughly outlined below.  He presents again with symptomatic anemia with H/H 5.7/17.4 today.  Admission labs also notable for COVID-positive.  1) Acute blood loss anemia 2) Small bowel AVMs -He has recurrent anemia from small bowel AVMs, which cannot be adequately managed endoscopically.  Some of the AVMs are out of reach of enteroscopy, but others are within reach, but simply recur despite successful endoscopic intervention.  This is all 2/2 underlying comorbidities, to include ESRD on HD. - No role for repeat endoscopy - 2 unit PRBC transfusion with repeat CBC posttransfusion - Can treat with octreotide while inpatient.  Recommend giving octreotide LAR prior to discharge which may increase interval time to next transfusion requirement -Regular CBC and iron study checks at HD sessions with transfusion, IV iron, etc. as needed per protocol (although he is noncompliant) - Continue conservative management - Clears okay  3) HCV cirrhosis - Never treated with elevated HCV quant in 07/2021  4) [place] - Treated with Y90  earlier this year - Continue follow-up as outpatient in the Oncology clinic  5) ESRD - HD per Nephrology  Gerrit Heck, [place], [place] [phone] office         [facility] Consult: 3:04 PM 04/14/2022  LOS: 0 days    Referring Provider: Dr Vanita Panda in ED  Primary Care Physician:  [name], NP Primary Gastroenterologist:  unassigned.  Inpt GI encounters only.       Reason for Consultation:  Recurrent GI Bleeding.  Passing dark blood.      HPI: Daniel [name] is a 61 y.o. male.  Nonambulatory, wheelchair-bound.  See outlined PMH below.  Previous Plavix after March 2023 R SFA stenting, discontinued due to recurrent GI bleeding and anemia.  ESRD.  MWF HD.  Recurrent GI bleeding with findings of duodenal ulcer, small bowel AVMs, erosive esophagitis, hyperplastic duodenal polyps on previous endo studies.  Chronic anemia (IDA, chronic disease, GI sources). HCV, never treated, quantitative 9,880,000 in 07/2021.  Hep cellular Ca dx 07/2019, started Y90 treatment for [facility] on 7/13, again on 8/3.  Chronic hyponatremia.   Latest GI studies include but there are several others dating to March 2021. 06/2019 colonoscopy: sigmoid diverticulosis, possible old blood in terminal ileum suggesting SB source.  Barrett's esophagus on 01/2021 EGD. 07/05/2021 SBE.  Friable gastric mucosa, normal esophagus.  A few, recently bleeding AVMs in duodenum treated with monopolar probe.  Single nonbleeding jejunal AVM treated with probe 07/05/2021 flex sig with blood in rectum, rectosigmoid and sigmoid. 08/06/21 SBE: Severe, circumferential esophagitis without bleeding.  Pill esophagitis suspected.  Friable gastric mucosa/gastritis.  Multiple friable duodenal polyps bled on contact and possible source of GI bleed.  Examined jejunum normal.  Pathology showed Candida esophagitis with erosion.  Gastric polyps were hyperplastic with focal erosion at surface, no H. pylori.  Duodenal polyps also  hyperplastic, no features of celiac disease on duodenal biopsies. 08/24/2021 SBE.  Distal extent tattooed.  Grade 1 esophageal varices.  Portal hypertensive gastropathy.  Duodenal polyps. 08/25/2021 VCE.  Duodenal polyps.  Small red spot vs diminutive AVM without blood anywhere.  Green liquid secretions and stool throughout the bowel.  Study considered nondiagnostic 09/15/2021 SBE.  3 duodenal AVMs treated with APC and felt to be likely source of bleeding.  Small esophageal varices, portal hypertensive gastropathy and solitary gastric polyp. 10/06/2021 VCE, capsule placed using endoscope.  Capsule moved back into the stomach then into the duodenum at about 6 hours and 20-minute 9 minutes then back into stomach where battery life of 12 hours expired.  Visualized previously cauterized sites of pre-pyloric/duodenal polyps, duodenal AVM w heme in region.  Dr. Carlean Purl chose not to pursue additional endoscopy.  Felt pt would need deep enteroscopy which may require tertiary care facility.  MD said "afraid he will have recurrent problems regardless" 08/05/2021 CT angio abdomen pelvis.  No active bleeding.  Outpouching near expected position of duodenal bulb or post bulbar duodenum, suspicious for focal duodenal ulcer.  Adjacent retroperitoneal fat edema.  4.4 cm mass near juncture of liver segments IVb and VIII as well as a possible adjacent 2.2 cm mass in segment VIII.  Differential includes hepatocellular carcinoma, neuroendocrine carcinoma.  Not excluded for cholangiocarcinoma and metastatic disease. 08/07/21 MRI abd, MRCP: Cirrhosis.  5 cm mass at segment 4A is LI-RADS 5, c/w hepatocellular carcinoma.  2 cm mass in the central right hepatic lobe LI-RADS 3. Consider follow-up in 3-6 months 10/09/2021: CT chest/abd/pelvis: Extensive multifocal pulmonary consolidation, infiltrate progressive compared with previous, bilateral pleural effusions.  Stable mild peripancreatic edema in pancreaticoduodenal groove, Querry mild  edema/interstitial pancreatitis.  Central hepatic masses not well delineated on noncontrast study.  Cirrhosis.  Cholelithiasis. Nonobstructing left nephrolithiasis AFP 13.9 in mid April 2023, 10.5 in early May 2023 11/14/2021 SBE. For Hgb 6.9, platelets 108, black stools:  Grade 1, nonbleeding esophageal varices, no stigmata of bleeding.  Nodular bulbar mucosa.  Tattoo seen at jejunum.  No blood, no bleeding, no obvious source of bleeding.  Dr. Loletha Carrow suspected distal small bowel is beyond the reach of enteroscope.  MD raised idea of adding long-acting octreotide therapy, IV iron and transfusion prn.    Insurance would not cover long-acting octreotide. Has Liver protocol CTAP w angio set for 04/25/22.  For fup of HCCA.    In ED yesterday for eval body aches.  Tested positive for Covid 19.  Left AMA due to wait time.  Presents this afternoon to ED w 2 d of dark emesis, dark/burgundy stools, mid abdominal pain.  Staff observed melena, burgundy loose stool running down his leg.  Not clear if pt compliant w all RXd meds, including Protonix 40 mg po BID HR to 102.  BPs 115-125/66-87.  Sats low to mid 90s.   Hgb 5.7.  was 6.8 1 week ago and up to 8.4 4d ago.   Na 128.    2 PRBCs are ordered.      Past Medical History:  Diagnosis Date   Anemia    Diabetes mellitus without complication [facility])    patient denies   Dialysis patient [facility])    End stage chronic kidney disease ([place])    Hypertension    ICH (intracerebral hemorrhage) ([place]) 05/20/2017   PAD (peripheral artery disease) ([place])  Shoulder pain, left 06/28/2013    Past Surgical History:  Procedure Laterality Date   A/V FISTULAGRAM N/A 08/15/2020   Procedure: A/V FISTULAGRAM - Left Upper;  Surgeon: Cherre Robins, MD;  Location: [place] [facility];  Service: Cardiovascular;  Laterality: N/A;   ABDOMINAL AORTOGRAM W/LOWER EXTREMITY N/A 04/08/2021   Procedure: ABDOMINAL AORTOGRAM W/LOWER EXTREMITY;  Surgeon: Waynetta Sandy, MD;   Location: [place] [facility];  Service: Cardiovascular;  Laterality: N/A;   AMPUTATION Left 04/16/2021   Procedure: LEFT BELOW KNEE AMPUTATION;  Surgeon: Angelia Mould, MD;  Location: [place];  Service: Vascular;  Laterality: Left;   AMPUTATION Left 06/17/2021   Procedure: REVISION AMPUTATION BELOW KNEE;  Surgeon: Broadus John, MD;  Location: [place];  Service: Vascular;  Laterality: Left;   APPENDECTOMY     APPLICATION OF WOUND VAC Left 06/17/2021   Procedure: APPLICATION OF WOUND VAC;  Surgeon: Broadus John, MD;  Location: [place];  Service: Vascular;  Laterality: Left;   AV FISTULA PLACEMENT Left 08/03/2019   Procedure: LEFT ARM ARTERIOVENOUS (AV) CEPHALIC  FISTULA CREATION;  Surgeon: Waynetta Sandy, MD;  Location: [place];  Service: Vascular;  Laterality: Left;   BIOPSY  06/30/2019   Procedure: BIOPSY;  Surgeon: Ronald Lobo, MD;  Location: [place] ENDOSCOPY;  Service: Endoscopy;;   BIOPSY  08/02/2019   Procedure: BIOPSY;  Surgeon: Yetta Flock, MD;  Location: [facility] ENDOSCOPY;  Service: Gastroenterology;;   BIOPSY  02/08/2021   Procedure: BIOPSY;  Surgeon: Sharyn Creamer, MD;  Location: [facility] ENDOSCOPY;  Service: Gastroenterology;;   BIOPSY  02/24/2021   Procedure: BIOPSY;  Surgeon: [name], MD;  Location: [facility] ENDOSCOPY;  Service: Gastroenterology;;   BIOPSY  03/26/2021   Procedure: BIOPSY;  Surgeon: Lavena Bullion, DO;  Location: [place];  Service: Gastroenterology;;   BIOPSY  08/06/2021   Procedure: BIOPSY;  Surgeon: [name], MD;  Location: [place];  Service: Gastroenterology;;   BIOPSY  09/15/2021   Procedure: BIOPSY;  Surgeon: Sharyn Creamer, MD;  Location: [place];  Service: Gastroenterology;;   BRONCHIAL BIOPSY  12/26/2021   Procedure: BRONCHIAL BIOPSIES;  Surgeon: Juanito Doom, MD;  Location: [place];  Service: Cardiopulmonary;;   BRONCHIAL WASHINGS  12/26/2021   Procedure: BRONCHIAL WASHINGS;  Surgeon: Juanito Doom, MD;  Location: [place];  Service: Cardiopulmonary;;   COLONOSCOPY  01/23/2012   Procedure: COLONOSCOPY;  Surgeon: Danie Binder, MD;  [facility];  Service: Endoscopy;  Laterality: N/A;  11:10 AM   COLONOSCOPY WITH PROPOFOL N/A 06/30/2019   Procedure: COLONOSCOPY WITH PROPOFOL;  Surgeon: Ronald Lobo, MD;  Location: [place];  Service: Endoscopy;  Laterality: N/A;   ENTEROSCOPY N/A 08/02/2019   Procedure: ENTEROSCOPY;  Surgeon: Yetta Flock, MD;  Location: [facility] ENDOSCOPY;  Service: Gastroenterology;  Laterality: N/A;   ENTEROSCOPY N/A 02/24/2021   Procedure: ENTEROSCOPY;  Surgeon: [name], MD;  Location: [facility] ENDOSCOPY;  Service: Gastroenterology;  Laterality: N/A;   ENTEROSCOPY N/A 03/26/2021   Procedure: ENTEROSCOPY;  Surgeon: Lavena Bullion, DO;  Location: [place];  Service: Gastroenterology;  Laterality: N/A;   ENTEROSCOPY N/A 07/05/2021   Procedure: ENTEROSCOPY;  Surgeon: Carol Ada, MD;  Location: [place];  Service: Gastroenterology;  Laterality: N/A;   ENTEROSCOPY N/A 08/06/2021   Procedure: ENTEROSCOPY;  Surgeon: [name], MD;  Location: [facility] ENDOSCOPY;  Service: Gastroenterology;  Laterality: N/A;   ENTEROSCOPY N/A 08/24/2021   Procedure: ENTEROSCOPY;  Surgeon:  Ladene Artist, MD;  Location: [facility] ENDOSCOPY;  Service: Gastroenterology;  Laterality: N/A;   ENTEROSCOPY N/A 09/15/2021   Procedure: ENTEROSCOPY;  Surgeon: Sharyn Creamer, MD;  Location: [facility] ENDOSCOPY;  Service: Gastroenterology;  Laterality: N/A;   ENTEROSCOPY N/A 10/06/2021   Procedure: ENTEROSCOPY;  Surgeon: [name], MD;  Location: [facility] ENDOSCOPY;  Service: Gastroenterology;  Laterality: N/A;   ENTEROSCOPY N/A 11/14/2021   Procedure: ENTEROSCOPY;  Surgeon: Doran Stabler, MD;  Location: [facility] ENDOSCOPY;  Service: Gastroenterology;  Laterality: N/A;   ESOPHAGOGASTRODUODENOSCOPY N/A 08/10/2020   Procedure: ESOPHAGOGASTRODUODENOSCOPY (EGD);  Surgeon:  Jerene Bears, MD;  Location: [facility] ENDOSCOPY;  Service: Gastroenterology;  Laterality: N/A;   ESOPHAGOGASTRODUODENOSCOPY (EGD) WITH PROPOFOL N/A 06/30/2019   Procedure: ESOPHAGOGASTRODUODENOSCOPY (EGD) WITH PROPOFOL;  Surgeon: Ronald Lobo, MD;  Location: [place];  Service: Endoscopy;  Laterality: N/A;   ESOPHAGOGASTRODUODENOSCOPY (EGD) WITH PROPOFOL N/A 01/12/2021   Procedure: ESOPHAGOGASTRODUODENOSCOPY (EGD) WITH PROPOFOL;  Surgeon: Sharyn Creamer, MD;  Location: [place];  Service: Gastroenterology;  Laterality: N/A;   ESOPHAGOGASTRODUODENOSCOPY (EGD) WITH PROPOFOL N/A 02/08/2021   Procedure: ESOPHAGOGASTRODUODENOSCOPY (EGD) WITH PROPOFOL;  Surgeon: Sharyn Creamer, MD;  Location: [place];  Service: Gastroenterology;  Laterality: N/A;   FISTULA SUPERFICIALIZATION Left 10/17/2019   Procedure: LEFT UPPER EXTREMITY FISTULA REVISION, SIDE BRANCH LIGATION,  AND SUPERFICIALIZATION;  Surgeon: Marty Heck, MD;  Location: [place];  Service: Vascular;  Laterality: Left;   FISTULA SUPERFICIALIZATION Left 76/73/4193   Procedure: PLICATION OF ANEURYSM LEFT ARTERIOVENOUS FISTULA;  Surgeon: Angelia Mould, MD;  Location: [place];  Service: Vascular;  Laterality: Left;   FLEXIBLE SIGMOIDOSCOPY N/A 07/05/2021   Procedure: FLEXIBLE SIGMOIDOSCOPY;  Surgeon: Carol Ada, MD;  Location: [place];  Service: Gastroenterology;  Laterality: N/A;   GIVENS CAPSULE STUDY N/A 06/30/2019   Procedure: GIVENS CAPSULE STUDY;  Surgeon: Ronald Lobo, MD;  Location: [place];  Service: Endoscopy;  Laterality: N/A;   GIVENS CAPSULE STUDY N/A 08/25/2021   Procedure: GIVENS CAPSULE STUDY;  Surgeon: Ladene Artist, MD;  Location: [place];  Service: Gastroenterology;  Laterality: N/A;   GIVENS CAPSULE STUDY N/A 10/06/2021   Procedure: GIVENS CAPSULE STUDY;  Surgeon: [name], MD;  Location: [place];  Service: Gastroenterology;  Laterality: N/A;   HEMOSTASIS CLIP PLACEMENT   08/10/2020   Procedure: HEMOSTASIS CLIP PLACEMENT;  Surgeon: Jerene Bears, MD;  Location: [place];  Service: Gastroenterology;;   HEMOSTASIS CLIP PLACEMENT  01/12/2021   Procedure: HEMOSTASIS CLIP PLACEMENT;  Surgeon: Sharyn Creamer, MD;  Location: [place];  Service: Gastroenterology;;   HEMOSTASIS CONTROL  08/02/2019   Procedure: HEMOSTASIS CONTROL;  Surgeon: Yetta Flock, MD;  Location: [place];  Service: Gastroenterology;;   HOT HEMOSTASIS  02/24/2021   Procedure: HOT HEMOSTASIS (ARGON PLASMA COAGULATION/BICAP);  Surgeon: [name], MD;  Location: [facility] ENDOSCOPY;  Service: Gastroenterology;;   HOT HEMOSTASIS N/A 03/26/2021   Procedure: HOT HEMOSTASIS (ARGON PLASMA COAGULATION/BICAP);  Surgeon: Lavena Bullion, DO;  Location: [facility] ENDOSCOPY;  Service: Gastroenterology;  Laterality: N/A;   HOT HEMOSTASIS N/A 07/05/2021   Procedure: HOT HEMOSTASIS (ARGON PLASMA COAGULATION/BICAP);  Surgeon: Carol Ada, MD;  Location: [place];  Service: Gastroenterology;  Laterality: N/A;   HOT HEMOSTASIS N/A 09/15/2021   Procedure: HOT HEMOSTASIS (ARGON PLASMA COAGULATION/BICAP);  Surgeon: Sharyn Creamer, MD;  Location: [place];  Service: Gastroenterology;  Laterality: N/A;   HOT HEMOSTASIS N/A 10/06/2021   Procedure: HOT HEMOSTASIS (ARGON PLASMA COAGULATION/BICAP);  Surgeon: [name],  MD;  Location: [place];  Service: Gastroenterology;  Laterality: N/A;   INCISION AND DRAINAGE ABSCESS N/A 06/29/2016   Procedure: INCISION AND DRAINAGE ABDOMINAL WALL ABSCESS;  Surgeon: Alphonsa Overall, MD;  Location: [facility];  Service: General;  Laterality: N/A;   INSERTION OF DIALYSIS CATHETER Right 08/03/2019   Procedure: INSERTION OF DIALYSIS CATHETER;  Surgeon: Waynetta Sandy, MD;  Location: [place];  Service: Vascular;  Laterality: Right;   INSERTION OF DIALYSIS CATHETER Right 10/22/2019   Procedure: INSERTION OF 23CM TUNNELED DIALYSIS CATHETER RIGHT INTERNAL JUGULAR;   Surgeon: Angelia Mould, MD;  Location: [place];  Service: Vascular;  Laterality: Right;   INSERTION OF DIALYSIS CATHETER Right 08/12/2020   Procedure: INSERTION OF Right internal Jugular TUNNELED  DIALYSIS CATHETER.;  Surgeon: Waynetta Sandy, MD;  Location: [place];  Service: Vascular;  Laterality: Right;   INSERTION OF DIALYSIS CATHETER N/A 04/19/2021   Procedure: INSERTION OF TUNNELED DIALYSIS CATHETER;  Surgeon: Angelia Mould, MD;  Location: [place];  Service: Vascular;  Laterality: N/A;   IR 3D INDEPENDENT WKST  11/07/2021   IR ANGIOGRAM SELECTIVE EACH ADDITIONAL VESSEL  11/07/2021   IR ANGIOGRAM SELECTIVE EACH ADDITIONAL VESSEL  11/07/2021   IR ANGIOGRAM SELECTIVE EACH ADDITIONAL VESSEL  11/07/2021   IR ANGIOGRAM SELECTIVE EACH ADDITIONAL VESSEL  11/28/2021   IR ANGIOGRAM VISCERAL SELECTIVE  11/07/2021   IR ANGIOGRAM VISCERAL SELECTIVE  11/28/2021   IR EMBO ARTERIAL NOT HEMORR HEMANG INC GUIDE ROADMAPPING  11/07/2021   IR EMBO TUMOR ORGAN ISCHEMIA INFARCT INC GUIDE ROADMAPPING  11/28/2021   IR PARACENTESIS  02/20/2022   IR RADIOLOGIST EVAL & MGMT  09/12/2021   IR US GUIDE VASC ACCESS LEFT  11/07/2021   IR US GUIDE VASC ACCESS LEFT  11/28/2021   Left heel surgery     LOWER EXTREMITY ANGIOGRAPHY N/A 07/08/2021   Procedure: Lower Extremity Angiography;  Surgeon: Cherre Robins, MD;  Location: [place] [facility];  Service: Cardiovascular;  Laterality: N/A;   PENILE BIOPSY N/A 03/26/2020   Procedure: PENILE ULCER DEBRIDEMENT;  Surgeon: Remi Haggard, MD;  [facility];  Service: Urology;  Laterality: N/A;  30 MINS   PERIPHERAL VASCULAR INTERVENTION Right 07/08/2021   Procedure: PERIPHERAL VASCULAR INTERVENTION;  Surgeon: Cherre Robins, MD;  Location: [place] [facility];  Service: Cardiovascular;  Laterality: Right;   SCLEROTHERAPY  01/12/2021   Procedure: SCLEROTHERAPY;  Surgeon: Sharyn Creamer, MD;  Location: [facility] ENDOSCOPY;  Service: Gastroenterology;;   Clide Deutscher   02/24/2021   Procedure: Clide Deutscher;  Surgeon: [name], MD;  Location: [place];  Service: Gastroenterology;;   SUBMUCOSAL TATTOO INJECTION  08/24/2021   Procedure: SUBMUCOSAL TATTOO INJECTION;  Surgeon: Ladene Artist, MD;  Location: [place];  Service: Gastroenterology;;   SUBMUCOSAL TATTOO INJECTION  09/15/2021   Procedure: SUBMUCOSAL TATTOO INJECTION;  Surgeon: Sharyn Creamer, MD;  Location: [place];  Service: Gastroenterology;;   VIDEO BRONCHOSCOPY N/A 12/26/2021   Procedure: VIDEO BRONCHOSCOPY WITH FLUORO;  Surgeon: Juanito Doom, MD;  Location: [place];  Service: Cardiopulmonary;  Laterality: N/A;    Prior to Admission medications   Medication Sig Start Date End Date Taking? Authorizing Provider  amLODipine (NORVASC) 5 MG tablet Take 1 tablet (5 mg total) by mouth daily. 04/01/22 06/30/22  Charlott Rakes, MD  atorvastatin (LIPITOR) 40 MG tablet Take 1 tablet (40 mg total) by mouth daily. 04/01/22 06/30/22  Charlott Rakes, MD  Calcium Carbonate Antacid (CALCIUM CARBONATE, DOSED IN MG ELEMENTAL  CALCIUM,) 1250 MG/5ML SUSP Take 5 mLs (500 mg of elemental calcium total) by mouth every 6 (six) hours as needed for indigestion. 03/29/22   Charlynne Cousins, MD  diclofenac Sodium (VOLTAREN) 1 % GEL Apply 2 g topically every 6 (six) hours as needed (apply on painful areas). 04/08/22   Shelly Coss, MD  gabapentin (NEURONTIN) 300 MG capsule Take 300 mg by mouth 3 (three) times daily.    [provider]  loperamide (IMODIUM) 2 MG capsule Take 1 capsule (2 mg total) by mouth 4 (four) times daily as needed for diarrhea or loose stools. 01/31/22   Carmin Muskrat, MD  nicotine (NICODERM CQ - DOSED IN MG/24 HOURS) 21 mg/24hr patch Place 1 patch (21 mg total) onto the skin daily. 04/09/22   Shelly Coss, MD  pantoprazole (PROTONIX) 40 MG tablet Take 40 mg by mouth 2 (two) times daily before a meal.    [provider]  traZODone (DESYREL) 50 MG  tablet Take 1 tablet (50 mg total) by mouth daily as needed for sleep. 02/10/22   [name], NP  colchicine 0.6 MG tablet Take 0.5 tablets (0.3 mg total) by mouth 2 (two) times daily. 07/24/20 07/29/20  Noemi Chapel, MD  furosemide (LASIX) 40 MG tablet Take 1 tablet (40 mg total) by mouth daily. 07/25/19 02/09/20  Ladona Horns, MD    Scheduled Meds:  sodium chloride   Intravenous Once   octreotide  50 mcg Intravenous Once   [START ON 04/18/2022] pantoprazole  40 mg Intravenous Q12H   Infusions:  cefTRIAXone (ROCEPHIN)  IV     octreotide (SANDOSTATIN) 500 mcg in sodium chloride 0.9 % 250 mL (2 mcg/mL) infusion     pantoprazole     pantoprazole     PRN Meds:    Allergies as of 04/14/2022 - Review Complete 04/14/2022  Allergen Reaction Noted   Seroquel [quetiapine] Other (See Comments) 04/22/2021   Dilaudid [hydromorphone hcl] Itching and Other (See Comments) 07/29/2020    Family History  Problem Relation Age of Onset   Colon cancer Neg Hx     Social History   Socioeconomic History   Marital status: Single    Spouse name: Not on file   Number of children: 2   Years of education: 12   Highest education level: 12th grade  Occupational History   Occupation: disabled  Tobacco Use   Smoking status: Every Day    Packs/day: 0.50    Years: 45.00    Total pack years: 22.50    Types: Cigarettes   Smokeless tobacco: Never   Tobacco comments:    Pt left before information given  Vaping Use   Vaping Use: Never used  Substance and Sexual Activity   Alcohol use: Not Currently   Drug use: Not Currently    Types: "Crack" cocaine    Comment: last in 2020   Sexual activity: Yes    Birth control/protection: None  Other Topics Concern   Not on file  Social History Narrative   Goes to dialysis M-W-F, [facility] using a wheelchair at present, waiting for prosthetic. Looking for handicapped housing for immediate move in.   Social Determinants of Health   Financial Resource  Strain: High Risk (11/01/2021)   Overall Financial Resource Strain (CARDIA)    Difficulty of Paying Living Expenses: Hard  Food Insecurity: No Food Insecurity (04/06/2022)   Hunger Vital Sign    Worried About Running Out of Food in the Last Year: Never true  Ran Out of Food in the Last Year: Never true  Transportation Needs: No Transportation Needs (04/06/2022)   PRAPARE - Hydrologist (Medical): No    Lack of Transportation (Non-Medical): No  Recent Concern: Transportation Needs - Unmet Transportation Needs (03/04/2022)   PRAPARE - Transportation    Lack of Transportation (Medical): Yes    Lack of Transportation (Non-Medical): Yes  Physical Activity: Insufficiently Active (11/01/2021)   Exercise Vital Sign    Days of Exercise per Week: 1 day    Minutes of Exercise per Session: 20 min  Stress: No Stress Concern Present (11/01/2021)   [place]    Feeling of Stress : Not at all  Social Connections: Socially Isolated (11/01/2021)   Social Connection and Isolation Panel [NHANES]    Frequency of Communication with Friends and Family: More than three times a week    Frequency of Social Gatherings with Friends and Family: More than three times a week    Attends Religious Services: Never    Marine scientist or Organizations: No    Attends Archivist Meetings: Never    Marital Status: Divorced  Human resources officer Violence: Not At Risk (04/06/2022)   Humiliation, Afraid, Rape, and Kick questionnaire    Fear of Current or Ex-Partner: No    Emotionally Abused: No    Physically Abused: No    Sexually Abused: No    REVIEW OF SYSTEMS: Constitutional:  weakness ENT:  No nose bleeds Pulm:  denies SOB CV:  No palpitations, no LE edema.  GU:  No hematuria, no frequency GI:  see HPI.  Says stools were brown prior to the weekend Heme:  other than Melena and dark emesis, no other unusual  bleeding or bruising.      Transfusions: All been transfused PRBCs on numerous occasions in the past few years.  2 PRBCs currently ordered. Neuro:  No headaches, no peripheral tingling or numbness.  No syncope.  No seizures. Derm:  No itching, no rash or sores.  Endocrine:  No sweats or chills.  No polyuria or dysuria Immunization: Reviewed. Travel: Not queried.   PHYSICAL EXAM: Vital signs in last 24 hours: Vitals:   04/14/22 1430 04/14/22 1445  BP:  125/87  Pulse: (!) 102 98  Resp: 17 15  Temp:    SpO2: 92% 93%   Wt Readings from Last 3 Encounters:  04/08/22 95.8 kg  04/03/22 95.3 kg  03/28/22 98.8 kg    General: Chronically and acutely ill appearing gentleman who is moaning. Head: No facial asymmetry or swelling.  No signs of head trauma. Eyes: Conjunctiva pale Ears: No obvious hearing deficit Nose: No discharge, no dried blood in the naris. Mouth: Poor dentition.  Tongue midline.  Mucosa moist, pink, clear.  No dried or fractured blood or lesions. Neck: No JVD, no masses, no thyromegaly Lungs: Diminished breath sounds globally but no adventitious sounds.  No labored breathing.  No cough. Heart: RRR.  No MRG.  S1, S2 present. Abdomen: Obese.  Soft.  Not tender.  Not distended.  Normal quality bowel sounds.  No HSM, masses, bruits, hernias. Rectal: Liquid black stool is strongly FOBT positive. Musc/Skeltl: No joint redness swelling or gross deformities.  Left BKA Extremities: Left BKA stump site with healthy looking surgical scar. Neurologic: Somnolent but arousable.  Follows simple commands.  Not oriented to place or time.  Is oriented to self. Skin: No open sores, rashes or  suspicious lesions   Psych: Flat affect.  Paucity of speech.  Intake/Output from previous day: No intake/output data recorded. Intake/Output this shift: No intake/output data recorded.  LAB RESULTS: Recent Labs    04/13/22 2033 04/14/22 1323  WBC 4.6 5.8  HGB 7.3* 5.7*  HCT 22.4* 17.4*   PLT 117* 123*   BMET Lab Results  Component Value Date   NA 128 (L) 04/13/2022   NA 126 (L) 04/10/2022   NA 130 (L) 04/08/2022   K 4.7 04/13/2022   K 4.8 04/10/2022   K 4.1 04/08/2022   CL 92 (L) 04/13/2022   CL 90 (L) 04/10/2022   CL 94 (L) 04/08/2022   CO2 21 (L) 04/13/2022   CO2 23 04/10/2022   CO2 27 04/08/2022   GLUCOSE 112 (H) 04/13/2022   GLUCOSE 102 (H) 04/10/2022   GLUCOSE 99 04/08/2022   BUN 60 (H) 04/13/2022   BUN 42 (H) 04/10/2022   BUN 32 (H) 04/08/2022   CREATININE 8.79 (H) 04/13/2022   CREATININE 8.62 (H) 04/10/2022   CREATININE 6.39 (H) 04/08/2022   CALCIUM 8.4 (L) 04/13/2022   CALCIUM 8.4 (L) 04/10/2022   CALCIUM 8.3 (L) 04/08/2022   LFT No results for input(s): "PROT", "ALBUMIN", "AST", "ALT", "ALKPHOS", "BILITOT", "BILIDIR", "IBILI" in the last 72 hours. PT/INR Lab Results  Component Value Date   INR 1.2 02/14/2022   INR 1.1 12/10/2021   INR 1.1 12/09/2021   Hepatitis Panel No results for input(s): "HEPBSAG", "HCVAB", "HEPAIGM", "HEPBIGM" in the last 72 hours. C-Diff No components found for: "CDIFF" Lipase     Component Value Date/Time   LIPASE 79 (H) 03/08/2022 0012    Drugs of Abuse     Component Value Date/Time   LABOPIA NONE DETECTED 03/29/2019 1209   COCAINSCRNUR POSITIVE (A) 03/29/2019 1209   LABBENZ NONE DETECTED 03/29/2019 1209   AMPHETMU NONE DETECTED 03/29/2019 1209   THCU NONE DETECTED 03/29/2019 1209   LABBARB NONE DETECTED 03/29/2019 1209     RADIOLOGY STUDIES: DG Chest Port 1 View  Result Date: 04/14/2022 CLINICAL DATA:  Cough, COVID-19 positive. EXAM: PORTABLE CHEST 1 VIEW COMPARISON:  April 10, 2022. FINDINGS: Stable cardiomegaly. Stable bilateral upper lobe airspace opacities are noted, left greater than right, consistent with pneumonia. Stable bibasilar atelectasis or infiltrates are noted with associated pleural effusions. Bony thorax is unremarkable. IMPRESSION: Stable bilateral lung opacities and pleural  effusions as described above. Electronically Signed   By: Marijo Conception M.D.   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 Previous bleed from DU     Anemia.  Due to ESRD and blood loss from  GU tract.  Hgb drop of 2.5 g over 5 days.      Cirrhosis. HCV never treated.    HCCA.  Dx 07/2019.  Y 90 treatment July and August 2023.      ESRD. HD MWF.  Hx non-compliance.      PLAN:     Supportive care.  Agree with transfusion 2 PRBCs.  Continue the octreotide drip that has been ordered, 72 hours should be sufficient.  Switched Protonix from bolus/drip to 40 mg IV bid.   After that can he get Depo octreotide shot before discharge.  Not clear Dr. Bryan Lemma plans to pursue repeat EGD, at the [facility] in July, there was no active bleeding or source for bleeding.  Suspicion is he has AVMs deep  in the small bowel, beyond reach of conventional enteroscopy.  [place] for clears.     Azucena Freed  04/14/2022, 3:04 PM Phone [phone]

## 2022-04-14 NOTE — Consult Note (Signed)
NAME:  CLAIRE BRIDGE, MRN:  [id number], DOB:  1961-02-20, LOS: 0 ADMISSION DATE:  04/14/2022, CONSULTATION DATE:  04/14/2022 REFERRING MD:  Dr. Avon Gully, CHIEF COMPLAINT:  UGIB    History of Present Illness:   [name], past medical history of peripheral arterial disease, dialysis patient Monday, end-stage renal disease, type 2 diabetes.  No problem progression patient has a history of hepatocellular carcinoma status post radiation.  I have seen him in clinic before for a lung nodule.  He has had GI bleeding in the past with 2 duodenal ulcers a AVM alcoholic cirrhosis with his known history hepatocellular carcinoma.  In the ER he was also found to be COVID-positive.  He ultimately had a large volume of bloody emesis in the emergency room he had respiratory failure and was intubated.  Pulmonary critical care was consulted for ICU was consulted for admission.  He had already been seen by hospitalist and admitted with GI consultation and IR but declined while he was in theEmergency department.   Pertinent Medical History   Past Medical History:  Diagnosis Date   Anemia    Diabetes mellitus without complication ([place])    patient denies   Dialysis patient [facility])    End stage chronic kidney disease ([place])    Hypertension    ICH (intracerebral hemorrhage) ([place]) 05/20/2017   PAD (peripheral artery disease) ([facility])    Shoulder pain, left 06/28/2013     Significant Hospital Events: Including procedures, antibiotic start and stop dates in addition to other pertinent events     Interim History / Subjective:  Per hpi   Objective   Blood pressure 125/87, pulse 98, temperature (!) 97.5 F (36.4 C), temperature source Oral, resp. rate 15, SpO2 93 %.       No intake or output data in the 24 hours ending 04/14/22 1658 There were no vitals filed for this visit.  Examination: General: [name], chronically ill-appearing intubated on mechanical  life support HENT: , NCAT, endotracheal tube in place Lungs: Bilateral mechanically ventilated breath sounds Cardiovascular: Regular rate rhythm, S1-S2 Abdomen: Soft nontender mildly distended Extremities: No significant edema, status post lower extremity amputation Neuro: Sedated on mechanical vent, examined postintubation after receiving dialysis prior to intubation GU: Deferred  Resolved Hospital Problem list     Assessment & Plan:   Acute blood loss anemia Upper GI bleeding History of small bowel AVMs Plan: Begin IV PPI Octreotide EGD tomorrow Transfusion PRBCs, platelets, cryo. Trend H&H  Acute hypoxemic respiratory failure requiring intubation and mechanical ventilation Plan: Adult mechanical vent protocol PFT prophylaxis PPI Wean PEEP and FiO2 as tolerated to maintain sats greater than 90%  COVID-positive, likely incidental finding Plan: 5 days quarantine  ?seizure like activity  - this occurred after large volume bloody vomitus P: EEG pending   ESRD on IHD Hyperkalemia  hyponatremia Plan: Per nephrology  Alcohol abuse disorder History of hepatocellular carcinoma Cirrhosis Plan: Monitor for withdrawal symptoms Alcohol level negative on admission. CIWA.  Lactic Acidosis  P: Liver disease, will have difficulty clearing due to liver disease  Will recheck  Getting fluids and blood products   Best Practice (right click and "Reselect all SmartList Selections" daily)   Diet/type: NPO DVT prophylaxis: SCD GI prophylaxis: PPI Lines: Central line Foley:  Yes, and it is still needed Code Status:  full code Last date of multidisciplinary goals of care discussion [we will reach out to his family]  Labs   CBC: Recent Labs  Lab 04/08/22 0414 04/10/22 0348 04/13/22 2033 04/14/22 1323  WBC 3.9* 4.0 4.6 5.8  NEUTROABS  --  2.3  --  4.3  HGB 8.0* 8.4* 7.3* 5.7*  HCT 24.5* 24.3* 22.4* 17.4*  MCV 100.0 98.4 103.2* 102.4*  PLT 121* 131* 117* 123*     Basic Metabolic Panel: Recent Labs  Lab 04/08/22 0414 04/10/22 0348 04/13/22 2033 04/14/22 1323 04/14/22 1459  NA 130* 126* 128* 130* 130*  K 4.1 4.8 4.7 5.1 5.3*  CL 94* 90* 92* 93* 92*  CO2 27 23 21* 18* 18*  GLUCOSE 99 102* 112* 104* 104*  BUN 32* 42* 60* 95* 98*  CREATININE 6.39* 8.62* 8.79* 9.83* 10.05*  CALCIUM 8.3* 8.4* 8.4* 8.5* 8.6*   GFR: Estimated Creatinine Clearance: 9 mL/min (A) (by C-G formula based on SCr of 10.05 mg/dL (H)). Recent Labs  Lab 04/08/22 0414 04/10/22 0348 04/13/22 2033 04/14/22 1323 04/14/22 1459  WBC 3.9* 4.0 4.6 5.8  --   LATICACIDVEN  --   --   --   --  5.9*    Liver Function Tests: Recent Labs  Lab 04/10/22 0348 04/14/22 1459  AST 125* 103*  ALT 41 28  ALKPHOS 101 75  BILITOT 1.0 1.5*  PROT 8.9* 7.2  ALBUMIN 1.9* 1.7*   Recent Labs  Lab 04/14/22 1459  LIPASE 57*   No results for input(s): "AMMONIA" in the last 168 hours.  ABG    Component Value Date/Time   PHART 7.358 05/20/2017 2157   PCO2ART 38.9 05/20/2017 2157   PO2ART 74.0 (L) 05/20/2017 2157   HCO3 28.7 (H) 03/29/2022 2214   TCO2 30 03/29/2022 2214   ACIDBASEDEF 4.0 (H) 01/02/2022 0700   O2SAT 88 03/29/2022 2214     Coagulation Profile: Recent Labs  Lab 04/14/22 1459  INR 1.3*    Cardiac Enzymes: No results for input(s): "CKTOTAL", "CKMB", "CKMBINDEX", "TROPONINI" in the last 168 hours.  HbA1C: Hemoglobin A1C  Date/Time Value Ref Range Status  09/03/2021 09:31 AM 5.2 4.0 - 5.6 % Final  09/26/2020 09:30 AM 5.4 4.0 - 5.6 % Final   Hgb A1c MFr Bld  Date/Time Value Ref Range Status  04/06/2022 08:00 AM 5.1 4.8 - 5.6 % Final    Comment:    (NOTE)         Prediabetes: 5.7 - 6.4         Diabetes: >6.4         Glycemic control for adults with diabetes: <7.0   03/26/2020 10:22 AM 6.4 (H) 4.8 - 5.6 % Final    Comment:    (NOTE) Pre diabetes:          5.7%-6.4%  Diabetes:              >6.4%  Glycemic control for   <7.0% adults with  diabetes     CBG: Recent Labs  Lab 04/07/22 1808 04/07/22 2358 04/08/22 0859  GLUCAP 105* 131* 92    Review of Systems:   Critically ill, unable to obtain   Past Medical History:  He,  has a past medical history of Anemia, Diabetes mellitus without complication ([place]), Dialysis patient ([place]), End stage chronic kidney disease ([place]), Hypertension, ICH (intracerebral hemorrhage) ([place]) (05/20/2017), PAD (peripheral artery disease) ([place]), and Shoulder pain, left (06/28/2013).   Surgical History:   Past Surgical History:  Procedure Laterality Date   A/V FISTULAGRAM N/A 08/15/2020   Procedure: A/V FISTULAGRAM - Left Upper;  Surgeon: Cherre Robins, MD;  Location: [facility]  INVASIVE CV LAB;  Service: Cardiovascular;  Laterality: N/A;   ABDOMINAL AORTOGRAM W/LOWER EXTREMITY N/A 04/08/2021   Procedure: ABDOMINAL AORTOGRAM W/LOWER EXTREMITY;  Surgeon: Waynetta Sandy, MD;  Location: [place] CV LAB;  Service: Cardiovascular;  Laterality: N/A;   AMPUTATION Left 04/16/2021   Procedure: LEFT BELOW KNEE AMPUTATION;  Surgeon: Angelia Mould, MD;  Location: [place];  Service: Vascular;  Laterality: Left;   AMPUTATION Left 06/17/2021   Procedure: REVISION AMPUTATION BELOW KNEE;  Surgeon: Broadus John, MD;  Location: [place];  Service: Vascular;  Laterality: Left;   APPENDECTOMY     APPLICATION OF WOUND VAC Left 06/17/2021   Procedure: APPLICATION OF WOUND VAC;  Surgeon: Broadus John, MD;  Location: [place];  Service: Vascular;  Laterality: Left;   AV FISTULA PLACEMENT Left 08/03/2019   Procedure: LEFT ARM ARTERIOVENOUS (AV) CEPHALIC  FISTULA CREATION;  Surgeon: Waynetta Sandy, MD;  Location: [place];  Service: Vascular;  Laterality: Left;   BIOPSY  06/30/2019   Procedure: BIOPSY;  Surgeon: Ronald Lobo, MD;  Location: [place] ENDOSCOPY;  Service: Endoscopy;;   BIOPSY  08/02/2019   Procedure: BIOPSY;  Surgeon: Yetta Flock, MD;  Location: [facility] ENDOSCOPY;  Service:  Gastroenterology;;   BIOPSY  02/08/2021   Procedure: BIOPSY;  Surgeon: Sharyn Creamer, MD;  Location: [facility] ENDOSCOPY;  Service: Gastroenterology;;   BIOPSY  02/24/2021   Procedure: BIOPSY;  Surgeon: [name], MD;  Location: [facility] ENDOSCOPY;  Service: Gastroenterology;;   BIOPSY  03/26/2021   Procedure: BIOPSY;  Surgeon: Lavena Bullion, DO;  Location: [place];  Service: Gastroenterology;;   BIOPSY  08/06/2021   Procedure: BIOPSY;  Surgeon: [name], MD;  Location: [place];  Service: Gastroenterology;;   BIOPSY  09/15/2021   Procedure: BIOPSY;  Surgeon: Sharyn Creamer, MD;  Location: [place];  Service: Gastroenterology;;   BRONCHIAL BIOPSY  12/26/2021   Procedure: BRONCHIAL BIOPSIES;  Surgeon: Juanito Doom, MD;  Location: [place];  Service: Cardiopulmonary;;   BRONCHIAL WASHINGS  12/26/2021   Procedure: BRONCHIAL WASHINGS;  Surgeon: Juanito Doom, MD;  Location: [employer];  Service: Cardiopulmonary;;   COLONOSCOPY  01/23/2012   Procedure: COLONOSCOPY;  Surgeon: Danie Binder, MD;  [facility];  Service: Endoscopy;  Laterality: N/A;  11:10 AM   COLONOSCOPY WITH PROPOFOL N/A 06/30/2019   Procedure: COLONOSCOPY WITH PROPOFOL;  Surgeon: Ronald Lobo, MD;  Location: [place];  Service: Endoscopy;  Laterality: N/A;   ENTEROSCOPY N/A 08/02/2019   Procedure: ENTEROSCOPY;  Surgeon: Yetta Flock, MD;  Location: [facility] ENDOSCOPY;  Service: Gastroenterology;  Laterality: N/A;   ENTEROSCOPY N/A 02/24/2021   Procedure: ENTEROSCOPY;  Surgeon: [name], MD;  Location: [facility] ENDOSCOPY;  Service: Gastroenterology;  Laterality: N/A;   ENTEROSCOPY N/A 03/26/2021   Procedure: ENTEROSCOPY;  Surgeon: Lavena Bullion, DO;  Location: [place];  Service: Gastroenterology;  Laterality: N/A;   ENTEROSCOPY N/A 07/05/2021   Procedure: ENTEROSCOPY;  Surgeon: Carol Ada, MD;  Location: [place];  Service: Gastroenterology;   Laterality: N/A;   ENTEROSCOPY N/A 08/06/2021   Procedure: ENTEROSCOPY;  Surgeon: [name], MD;  Location: [facility] ENDOSCOPY;  Service: Gastroenterology;  Laterality: N/A;   ENTEROSCOPY N/A 08/24/2021   Procedure: ENTEROSCOPY;  Surgeon: Ladene Artist, MD;  Location: [facility] ENDOSCOPY;  Service: Gastroenterology;  Laterality: N/A;   ENTEROSCOPY N/A 09/15/2021   Procedure: ENTEROSCOPY;  Surgeon: Sharyn Creamer, MD;  Location: [facility] ENDOSCOPY;  Service: Gastroenterology;  Laterality: N/A;   ENTEROSCOPY N/A 10/06/2021   Procedure: ENTEROSCOPY;  Surgeon: [name], MD;  Location: [facility] ENDOSCOPY;  Service: Gastroenterology;  Laterality: N/A;   ENTEROSCOPY N/A 11/14/2021   Procedure: ENTEROSCOPY;  Surgeon: Doran Stabler, MD;  Location: [facility] ENDOSCOPY;  Service: Gastroenterology;  Laterality: N/A;   ESOPHAGOGASTRODUODENOSCOPY N/A 08/10/2020   Procedure: ESOPHAGOGASTRODUODENOSCOPY (EGD);  Surgeon: Jerene Bears, MD;  Location: [facility] ENDOSCOPY;  Service: Gastroenterology;  Laterality: N/A;   ESOPHAGOGASTRODUODENOSCOPY (EGD) WITH PROPOFOL N/A 06/30/2019   Procedure: ESOPHAGOGASTRODUODENOSCOPY (EGD) WITH PROPOFOL;  Surgeon: Ronald Lobo, MD;  Location: [place];  Service: Endoscopy;  Laterality: N/A;   ESOPHAGOGASTRODUODENOSCOPY (EGD) WITH PROPOFOL N/A 01/12/2021   Procedure: ESOPHAGOGASTRODUODENOSCOPY (EGD) WITH PROPOFOL;  Surgeon: Sharyn Creamer, MD;  Location: [place];  Service: Gastroenterology;  Laterality: N/A;   ESOPHAGOGASTRODUODENOSCOPY (EGD) WITH PROPOFOL N/A 02/08/2021   Procedure: ESOPHAGOGASTRODUODENOSCOPY (EGD) WITH PROPOFOL;  Surgeon: Sharyn Creamer, MD;  Location: [place];  Service: Gastroenterology;  Laterality: N/A;   FISTULA SUPERFICIALIZATION Left 10/17/2019   Procedure: LEFT UPPER EXTREMITY FISTULA REVISION, SIDE BRANCH LIGATION,  AND SUPERFICIALIZATION;  Surgeon: Marty Heck, MD;  Location: [place];  Service: Vascular;  Laterality: Left;   FISTULA  SUPERFICIALIZATION Left 52/84/1324   Procedure: PLICATION OF ANEURYSM LEFT ARTERIOVENOUS FISTULA;  Surgeon: Angelia Mould, MD;  Location: [place];  Service: Vascular;  Laterality: Left;   FLEXIBLE SIGMOIDOSCOPY N/A 07/05/2021   Procedure: FLEXIBLE SIGMOIDOSCOPY;  Surgeon: Carol Ada, MD;  Location: [place];  Service: Gastroenterology;  Laterality: N/A;   GIVENS CAPSULE STUDY N/A 06/30/2019   Procedure: GIVENS CAPSULE STUDY;  Surgeon: Ronald Lobo, MD;  Location: [place];  Service: Endoscopy;  Laterality: N/A;   GIVENS CAPSULE STUDY N/A 08/25/2021   Procedure: GIVENS CAPSULE STUDY;  Surgeon: Ladene Artist, MD;  Location: [place];  Service: Gastroenterology;  Laterality: N/A;   GIVENS CAPSULE STUDY N/A 10/06/2021   Procedure: GIVENS CAPSULE STUDY;  Surgeon: [name], MD;  Location: [place];  Service: Gastroenterology;  Laterality: N/A;   HEMOSTASIS CLIP PLACEMENT  08/10/2020   Procedure: HEMOSTASIS CLIP PLACEMENT;  Surgeon: Jerene Bears, MD;  Location: [place];  Service: Gastroenterology;;   HEMOSTASIS CLIP PLACEMENT  01/12/2021   Procedure: HEMOSTASIS CLIP PLACEMENT;  Surgeon: Sharyn Creamer, MD;  Location: [place];  Service: Gastroenterology;;   HEMOSTASIS CONTROL  08/02/2019   Procedure: HEMOSTASIS CONTROL;  Surgeon: Yetta Flock, MD;  Location: [place];  Service: Gastroenterology;;   HOT HEMOSTASIS  02/24/2021   Procedure: HOT HEMOSTASIS (ARGON PLASMA COAGULATION/BICAP);  Surgeon: [name], MD;  Location: [name] ENDOSCOPY;  Service: Gastroenterology;;   HOT HEMOSTASIS N/A 03/26/2021   Procedure: HOT HEMOSTASIS (ARGON PLASMA COAGULATION/BICAP);  Surgeon: Lavena Bullion, DO;  Location: [facility] ENDOSCOPY;  Service: Gastroenterology;  Laterality: N/A;   HOT HEMOSTASIS N/A 07/05/2021   Procedure: HOT HEMOSTASIS (ARGON PLASMA COAGULATION/BICAP);  Surgeon: Carol Ada, MD;  Location: [place];  Service: Gastroenterology;   Laterality: N/A;   HOT HEMOSTASIS N/A 09/15/2021   Procedure: HOT HEMOSTASIS (ARGON PLASMA COAGULATION/BICAP);  Surgeon: Sharyn Creamer, MD;  Location: [place];  Service: Gastroenterology;  Laterality: N/A;   HOT HEMOSTASIS N/A 10/06/2021   Procedure: HOT HEMOSTASIS (ARGON PLASMA COAGULATION/BICAP);  Surgeon: [name], MD;  Location: [place];  Service: Gastroenterology;  Laterality: N/A;   INCISION AND DRAINAGE ABSCESS N/A 06/29/2016   Procedure: INCISION AND DRAINAGE ABDOMINAL WALL ABSCESS;  Surgeon: Alphonsa Overall, MD;  Location: [facility];  Service: General;  Laterality: N/A;   INSERTION OF DIALYSIS CATHETER Right 08/03/2019   Procedure: INSERTION OF DIALYSIS CATHETER;  Surgeon: Waynetta Sandy, MD;  Location: [place];  Service: Vascular;  Laterality: Right;   INSERTION OF DIALYSIS CATHETER Right 10/22/2019   Procedure: INSERTION OF 23CM TUNNELED DIALYSIS CATHETER RIGHT INTERNAL JUGULAR;  Surgeon: Angelia Mould, MD;  Location: [place];  Service: Vascular;  Laterality: Right;   INSERTION OF DIALYSIS CATHETER Right 08/12/2020   Procedure: INSERTION OF Right internal Jugular TUNNELED  DIALYSIS CATHETER.;  Surgeon: Waynetta Sandy, MD;  Location: [place];  Service: Vascular;  Laterality: Right;   INSERTION OF DIALYSIS CATHETER N/A 04/19/2021   Procedure: INSERTION OF TUNNELED DIALYSIS CATHETER;  Surgeon: Angelia Mould, MD;  Location: [place];  Service: Vascular;  Laterality: N/A;   IR 3D INDEPENDENT WKST  11/07/2021   IR ANGIOGRAM SELECTIVE EACH ADDITIONAL VESSEL  11/07/2021   IR ANGIOGRAM SELECTIVE EACH ADDITIONAL VESSEL  11/07/2021   IR ANGIOGRAM SELECTIVE EACH ADDITIONAL VESSEL  11/07/2021   IR ANGIOGRAM SELECTIVE EACH ADDITIONAL VESSEL  11/28/2021   IR ANGIOGRAM VISCERAL SELECTIVE  11/07/2021   IR ANGIOGRAM VISCERAL SELECTIVE  11/28/2021   IR EMBO ARTERIAL NOT HEMORR HEMANG INC GUIDE ROADMAPPING  11/07/2021   IR EMBO TUMOR ORGAN ISCHEMIA INFARCT INC GUIDE  ROADMAPPING  11/28/2021   IR PARACENTESIS  02/20/2022   IR RADIOLOGIST EVAL & MGMT  09/12/2021   IR US GUIDE VASC ACCESS LEFT  11/07/2021   IR US GUIDE VASC ACCESS LEFT  11/28/2021   Left heel surgery     LOWER EXTREMITY ANGIOGRAPHY N/A 07/08/2021   Procedure: Lower Extremity Angiography;  Surgeon: Cherre Robins, MD;  Location: [place] CV LAB;  Service: Cardiovascular;  Laterality: N/A;   PENILE BIOPSY N/A 03/26/2020   Procedure: PENILE ULCER DEBRIDEMENT;  Surgeon: Remi Haggard, MD;  [facility];  Service: Urology;  Laterality: N/A;  30 MINS   PERIPHERAL VASCULAR INTERVENTION Right 07/08/2021   Procedure: PERIPHERAL VASCULAR INTERVENTION;  Surgeon: Cherre Robins, MD;  Location: [place] CV LAB;  Service: Cardiovascular;  Laterality: Right;   SCLEROTHERAPY  01/12/2021   Procedure: SCLEROTHERAPY;  Surgeon: Sharyn Creamer, MD;  Location: [facility] ENDOSCOPY;  Service: Gastroenterology;;   Clide Deutscher  02/24/2021   Procedure: Clide Deutscher;  Surgeon: [name], MD;  Location: [place];  Service: Gastroenterology;;   SUBMUCOSAL TATTOO INJECTION  08/24/2021   Procedure: SUBMUCOSAL TATTOO INJECTION;  Surgeon: Ladene Artist, MD;  Location: [place];  Service: Gastroenterology;;   SUBMUCOSAL TATTOO INJECTION  09/15/2021   Procedure: SUBMUCOSAL TATTOO INJECTION;  Surgeon: Sharyn Creamer, MD;  Location: [place];  Service: Gastroenterology;;   VIDEO BRONCHOSCOPY N/A 12/26/2021   Procedure: VIDEO BRONCHOSCOPY WITH FLUORO;  Surgeon: Juanito Doom, MD;  Location: [place];  Service: Cardiopulmonary;  Laterality: N/A;     Social History:   reports that he has been smoking cigarettes. He has a 22.50 pack-year smoking history. He has never used smokeless tobacco. He reports that he does not currently use alcohol. He reports that he does not currently use drugs after having used the following drugs: "Crack" cocaine.   Family History:  His family history is negative  for Colon cancer.   Allergies Allergies  Allergen Reactions   Seroquel [Quetiapine] Other (See Comments)    Tardive kinesia Dystonia    Dilaudid [Hydromorphone Hcl] Itching and Other (See Comments)    Pt reports itchiness after IM injection  Home Medications  Prior to Admission medications   Medication Sig Start Date End Date Taking? Authorizing Provider  amLODipine (NORVASC) 5 MG tablet Take 1 tablet (5 mg total) by mouth daily. 04/01/22 06/30/22  Charlott Rakes, MD  atorvastatin (LIPITOR) 40 MG tablet Take 1 tablet (40 mg total) by mouth daily. 04/01/22 06/30/22  Charlott Rakes, MD  Calcium Carbonate Antacid (CALCIUM CARBONATE, DOSED IN MG ELEMENTAL CALCIUM,) 1250 MG/5ML SUSP Take 5 mLs (500 mg of elemental calcium total) by mouth every 6 (six) hours as needed for indigestion. 03/29/22   Charlynne Cousins, MD  diclofenac Sodium (VOLTAREN) 1 % GEL Apply 2 g topically every 6 (six) hours as needed (apply on painful areas). 04/08/22   Shelly Coss, MD  gabapentin (NEURONTIN) 300 MG capsule Take 300 mg by mouth 3 (three) times daily.    [provider]  loperamide (IMODIUM) 2 MG capsule Take 1 capsule (2 mg total) by mouth 4 (four) times daily as needed for diarrhea or loose stools. 01/31/22   Carmin Muskrat, MD  nicotine (NICODERM CQ - DOSED IN MG/24 HOURS) 21 mg/24hr patch Place 1 patch (21 mg total) onto the skin daily. 04/09/22   Shelly Coss, MD  pantoprazole (PROTONIX) 40 MG tablet Take 40 mg by mouth 2 (two) times daily before a meal.    [provider]  traZODone (DESYREL) 50 MG tablet Take 1 tablet (50 mg total) by mouth daily as needed for sleep. 02/10/22   [name], NP  colchicine 0.6 MG tablet Take 0.5 tablets (0.3 mg total) by mouth 2 (two) times daily. 07/24/20 07/29/20  Noemi Chapel, MD  furosemide (LASIX) 40 MG tablet Take 1 tablet (40 mg total) by mouth daily. 07/25/19 02/09/20  Ladona Horns, MD     This patient is critically ill with  multiple organ system failure; which, requires frequent high complexity decision making, assessment, support, evaluation, and titration of therapies. This was completed through the application of advanced monitoring technologies and extensive interpretation of multiple databases. During this encounter critical care time was devoted to patient care services described in this note for 34 minutes.  Garner Nash, DO [facility] 04/14/2022 4:58 PM

## 2022-04-14 NOTE — ED Provider Notes (Signed)
[facility] Provider Note   CSN: [id number] Arrival date & time: 04/14/22  1157     History  Chief Complaint  Patient presents with   Generalized Body Aches   Cough    Daniel [name] is a 61 y.o. male.  This is a 61 year old male with ESRD on dialysis Monday Wednesday Friday, has not dialyzed since Friday.  Came in this morning and left due to body aches, left due to wait time and came back via EMS complaining of abdominal pain and generally feeling unwell.  At triage he stopped using urinal and had dark melanotic stool with some red blood as well noted on exam running down his leg.  He has history of alcohol abuse and hepatitis C cirrhosis.  He also has history of ulcers and esophageal varices.  Denies hematemesis.  He does report fevers at home and bodyaches he was seen yesterday in the ED and eloped but did have a noted positive COVID-19 on his record review   Cough      Home Medications Prior to Admission medications   Medication Sig Start Date End Date Taking? Authorizing Provider  amLODipine (NORVASC) 5 MG tablet Take 1 tablet (5 mg total) by mouth daily. 04/01/22 06/30/22  Charlott Rakes, MD  atorvastatin (LIPITOR) 40 MG tablet Take 1 tablet (40 mg total) by mouth daily. 04/01/22 06/30/22  Charlott Rakes, MD  Calcium Carbonate Antacid (CALCIUM CARBONATE, DOSED IN MG ELEMENTAL CALCIUM,) 1250 MG/5ML SUSP Take 5 mLs (500 mg of elemental calcium total) by mouth every 6 (six) hours as needed for indigestion. 03/29/22   Charlynne Cousins, MD  diclofenac Sodium (VOLTAREN) 1 % GEL Apply 2 g topically every 6 (six) hours as needed (apply on painful areas). 04/08/22   Shelly Coss, MD  gabapentin (NEURONTIN) 300 MG capsule Take 300 mg by mouth 3 (three) times daily.    [provider]  loperamide (IMODIUM) 2 MG capsule Take 1 capsule (2 mg total) by mouth 4 (four) times daily as needed for diarrhea or loose stools. 01/31/22    Carmin Muskrat, MD  nicotine (NICODERM CQ - DOSED IN MG/24 HOURS) 21 mg/24hr patch Place 1 patch (21 mg total) onto the skin daily. 04/09/22   Shelly Coss, MD  pantoprazole (PROTONIX) 40 MG tablet Take 40 mg by mouth 2 (two) times daily before a meal.    [provider]  traZODone (DESYREL) 50 MG tablet Take 1 tablet (50 mg total) by mouth daily as needed for sleep. 02/10/22   [name], NP  colchicine 0.6 MG tablet Take 0.5 tablets (0.3 mg total) by mouth 2 (two) times daily. 07/24/20 07/29/20  Noemi Chapel, MD  furosemide (LASIX) 40 MG tablet Take 1 tablet (40 mg total) by mouth daily. 07/25/19 02/09/20  Ladona Horns, MD      Allergies    Seroquel [quetiapine] and Dilaudid [hydromorphone hcl]    Review of Systems   Review of Systems  Respiratory:  Positive for cough.   Gastrointestinal:  Positive for blood in stool and nausea.    Physical Exam Updated Vital Signs BP 125/87   Pulse 98   Temp (!) 97.5 F (36.4 C) (Oral)   Resp 15   SpO2 93%  Physical Exam Vitals and nursing note reviewed.  Constitutional:      General: He is not in acute distress.    Appearance: He is well-developed. He is ill-appearing.  HENT:     Head: Normocephalic and atraumatic.  Mouth/Throat:     Mouth: Mucous membranes are moist.  Eyes:     Conjunctiva/sclera: Conjunctivae normal.  Cardiovascular:     Rate and Rhythm: Normal rate and regular rhythm.     Heart sounds: No murmur heard. Pulmonary:     Effort: Pulmonary effort is normal. No respiratory distress.     Breath sounds: Normal breath sounds.  Abdominal:     Palpations: Abdomen is soft.     Tenderness: There is no abdominal tenderness.     Comments: Epigastric tenderness  Musculoskeletal:     Cervical back: Neck supple.     Comments: Noted left BKA  Skin:    General: Skin is warm and dry.     Capillary Refill: Capillary refill takes less than 2 seconds.  Neurological:     General: No focal deficit present.      Mental Status: He is alert and oriented to person, place, and time.  Psychiatric:        Mood and Affect: Mood normal.     ED Results / Procedures / Treatments   Labs (all labs ordered are listed, but only abnormal results are displayed) Labs Reviewed  CBC WITH DIFFERENTIAL/PLATELET - Abnormal; Notable for the following components:      Result Value   RBC 1.70 (*)    Hemoglobin 5.7 (*)    HCT 17.4 (*)    MCV 102.4 (*)    RDW 18.6 (*)    Platelets 123 (*)    All other components within normal limits  COMPREHENSIVE METABOLIC PANEL - Abnormal; Notable for the following components:   Sodium 130 (*)    Potassium 5.3 (*)    Chloride 92 (*)    CO2 18 (*)    Glucose, Bld 104 (*)    BUN 98 (*)    Creatinine, Ser 10.05 (*)    Calcium 8.6 (*)    Albumin 1.7 (*)    AST 103 (*)    Total Bilirubin 1.5 (*)    GFR, Estimated 5 (*)    Anion gap 20 (*)    All other components within normal limits  PROTIME-INR - Abnormal; Notable for the following components:   Prothrombin Time 16.4 (*)    INR 1.3 (*)    All other components within normal limits  LIPASE, BLOOD - Abnormal; Notable for the following components:   Lipase 57 (*)    All other components within normal limits  ETHANOL  LACTIC ACID, PLASMA  LACTIC ACID, PLASMA  AMMONIA  HEPATITIS B SURFACE ANTIGEN  HEPATITIS B SURFACE ANTIBODY, QUANTITATIVE  TYPE AND SCREEN  PREPARE RBC (CROSSMATCH)    EKG EKG Interpretation  Date/Time:  Monday April 14 2022 14:24:15 EST Ventricular Rate:  99 PR Interval:  163 QRS Duration: 79 QT Interval:  375 QTC Calculation: 482 R Axis:   48 Text Interpretation: Sinus rhythm Anterior infarct, old Artifact Abnormal ECG Confirmed by Carmin Muskrat [phone]) on 04/14/2022 4:37:48 PM  Radiology DG Chest Port 1 View  Result Date: 04/14/2022 CLINICAL DATA:  Cough, COVID-19 positive. EXAM: PORTABLE CHEST 1 VIEW COMPARISON:  April 10, 2022. FINDINGS: Stable cardiomegaly. Stable bilateral  upper lobe airspace opacities are noted, left greater than right, consistent with pneumonia. Stable bibasilar atelectasis or infiltrates are noted with associated pleural effusions. Bony thorax is unremarkable. IMPRESSION: Stable bilateral lung opacities and pleural effusions as described above. Electronically Signed   By: Marijo Conception M.D.   On: 04/14/2022 13:25    Procedures .Critical Care  Performed by: Gwenevere Abbot, PA-C Authorized by: Gwenevere Abbot, PA-C   Critical care provider statement:    Critical care time (minutes):  30   Critical care time was exclusive of:  Separately billable procedures and treating other patients   Critical care was necessary to treat or prevent imminent or life-threatening deterioration of the following conditions:  Circulatory failure   Critical care was time spent personally by me on the following activities:  Development of treatment plan with patient or surrogate, discussions with consultants, evaluation of patient's response to treatment, examination of patient, ordering and review of laboratory studies, ordering and review of radiographic studies, ordering and performing treatments and interventions, pulse oximetry, re-evaluation of patient's condition and review of old charts   Care discussed with: admitting provider   Comments:     Discussed the care with nephrology and with gastroenterology     Medications Ordered in ED Medications  0.9 %  sodium chloride infusion (Manually program via Guardrails IV Fluids) (has no administration in time range)  octreotide (SANDOSTATIN) 2 mcg/mL load via infusion 50 mcg (50 mcg Intravenous Bolus from Bag 04/14/22 1629)    And  octreotide (SANDOSTATIN) 500 mcg in sodium chloride 0.9 % 250 mL (2 mcg/mL) infusion (50 mcg/hr Intravenous New Bag/Given 04/14/22 1631)  acetaminophen (TYLENOL) tablet 650 mg (has no administration in time range)    Or  acetaminophen (TYLENOL) suppository 650 mg (has no  administration in time range)  ondansetron (ZOFRAN) tablet 4 mg (has no administration in time range)    Or  ondansetron (ZOFRAN) injection 4 mg (has no administration in time range)  pantoprazole (PROTONIX) injection 40 mg (has no administration in time range)  propofol (DIPRIVAN) 1000 MG/100ML infusion (has no administration in time range)  Chlorhexidine Gluconate Cloth 2 % PADS 6 each (has no administration in time range)  sodium zirconium cyclosilicate (LOKELMA) packet 10 g (has no administration in time range)  cefTRIAXone (ROCEPHIN) 1 g in sodium chloride 0.9 % 100 mL IVPB (0 g Intravenous Stopped 04/14/22 1540)    ED Course/ Medical Decision Making/ A&P Clinical Course as of 04/14/22 1640  Mon Apr 14, 2022  1321 DG Chest [place] 1 View [OZ]    Clinical Course User Index [OZ] Luvenia Heller, PA-C                           Medical Decision Making This patient presents to the ED for concern of bodyaches and chills and cough, this involves an extensive number of treatment options, and is a complaint that carries with it a high risk of complications and morbidity.  The differential diagnosis includes pneumonia, COPD exacerbation, CHF,    Co morbidities that complicate the patient evaluation  ESRD on dialysis Monday Wednesday Friday and medical noncompliance   Additional history obtained:  Additional history obtained from prior hospitalization notes and history neurology consult notes    Lab Tests:  I Ordered, and personally interpreted labs.  The pertinent results include: Hemoglobin 2 points lower than baseline on patient CBC, labs were drawn yesterday.  CMP shows anion gap, significant uremia likely the cause.  There is also mild hyponatremia and mild hyperkalemia   Imaging Studies ordered:  I ordered imaging studies including chest x-ray I independently visualized and interpreted imaging which showed bilateral upper lobe opacities appear similar to previous I agree with  the radiologist interpretation   Cardiac Monitoring: / EKG:  The patient was maintained  on a cardiac monitor.  I personally viewed and interpreted the cardiac monitored which showed an underlying rhythm of: Sinus rhythm   Consultations Obtained:  I requested consultation with the hospitalist,  and discussed lab and imaging findings as well as pertinent plan - they recommend: Admission, they would like me to discuss with nephrology prior to giving blood due to the patient's missing dialysis to avoid volume overload, consult was placed   Problem List / ED Course / Critical interventions / Medication management  GI bleed: Patient has 2 point hemoglobin drop.  Blood was ordered.  He also was given Rocephin and octreotide Protonix for his GI bleeding esophageal varices.  He was not tachycardic nor hypotensive.  Care was discussed with my attending who spoke with GI who agreed with admission to hospitalist with plan for scope.  Case with hospitalist for admission and they were agreeable as well but requested I speak with nephrology.  They wanted me to have nephrology okay patient's blood transfusion to avoid volume overload since he has been noncompliant with dialysis.  The nephrologist was consulted and did agree with 1 unit slowly over several hours and the second unit can be done during dialysis.  I did give consideration for alcohol withdrawal patient reported he did have some alcohol today.  He is not tachycardic or tensive, not having any tremors, no sweating or hallucinations.  I ordered medication documented above Reevaluation of the patient after these medicines showed that the patient initially was unchanged, after admission patient reportedly had a seizure after vomiting large amounts of blood and was intubated critical care team. I have reviewed the patients home medicines and have made adjustments as needed   Social Determinants of Health:  Patient has noncompliance and alcohol  abuse   Test / Admission - Considered: Documented above      Amount and/or Complexity of Data Reviewed Labs: ordered.  Risk Prescription drug management.           Final Clinical Impression(s) / ED Diagnoses Final diagnoses:  Gastrointestinal hemorrhage with melena  Anemia, unspecified type    Rx / DC Orders ED Discharge Orders     None         Darci Current 04/14/22 1640    Carmin Muskrat, MD 04/15/22 1011

## 2022-04-14 NOTE — Progress Notes (Signed)
Transported patient from emergency room to [facility] room 14 while patient was on the ventilator. Patient remained stable during transport.

## 2022-04-14 NOTE — Consult Note (Signed)
[facility] Renal Consultation Note    Indication for Consultation:  Management of ESRD/hemodialysis; anemia, hypertension/volume and secondary hyperparathyroidism  HPI: Daniel [name] is a 61 y.o. male with ESRD, T2DM, Hep C cirrhosis, hepatocellular carcinoma s/p radiation, recurrent GIB, Hx ICH in 2019, PAD s/p L BKA who was just discharged on 04/08/22 and again 04/11/22 with acute hypoxic respiratory failure who presented to [facility] ED on 04/13/22 via EMS after missing HD on Friday c/o SOB, chest tightness, N/V but left AMA only to return again today with same complaints along with generalized body pain and cough.  He has also noted melena and weakness.  In the ED, VSS, SpO2 96%, labs notable for Hgb of 5.7, covid-19 +, K 5.1, BUN 95.  He is being admitted and we were consulted to provide HD during his hospitalization.    Past Medical History:  Diagnosis Date   Anemia    Diabetes mellitus without complication [facility])    patient denies   Dialysis patient [facility])    End stage chronic kidney disease ([place])    Hypertension    ICH (intracerebral hemorrhage) ([place]) 05/20/2017   PAD (peripheral artery disease) (HCC)    Shoulder pain, left 06/28/2013   Past Surgical History:  Procedure Laterality Date   A/V FISTULAGRAM N/A 08/15/2020   Procedure: A/V FISTULAGRAM - Left Upper;  Surgeon: Cherre Robins, MD;  Location: [place] CV LAB;  Service: Cardiovascular;  Laterality: N/A;   ABDOMINAL AORTOGRAM W/LOWER EXTREMITY N/A 04/08/2021   Procedure: ABDOMINAL AORTOGRAM W/LOWER EXTREMITY;  Surgeon: Waynetta Sandy, MD;  Location: [place] CV LAB;  Service: Cardiovascular;  Laterality: N/A;   AMPUTATION Left 04/16/2021   Procedure: LEFT BELOW KNEE AMPUTATION;  Surgeon: Angelia Mould, MD;  Location: [place];  Service: Vascular;  Laterality: Left;   AMPUTATION Left 06/17/2021   Procedure: REVISION AMPUTATION BELOW KNEE;  Surgeon: Broadus John, MD;  Location: [place];   Service: Vascular;  Laterality: Left;   APPENDECTOMY     APPLICATION OF WOUND VAC Left 06/17/2021   Procedure: APPLICATION OF WOUND VAC;  Surgeon: Broadus John, MD;  Location: [place];  Service: Vascular;  Laterality: Left;   AV FISTULA PLACEMENT Left 08/03/2019   Procedure: LEFT ARM ARTERIOVENOUS (AV) CEPHALIC  FISTULA CREATION;  Surgeon: Waynetta Sandy, MD;  Location: [place];  Service: Vascular;  Laterality: Left;   BIOPSY  06/30/2019   Procedure: BIOPSY;  Surgeon: Ronald Lobo, MD;  Location: [facility] ENDOSCOPY;  Service: Endoscopy;;   BIOPSY  08/02/2019   Procedure: BIOPSY;  Surgeon: Yetta Flock, MD;  Location: [facility] ENDOSCOPY;  Service: Gastroenterology;;   BIOPSY  02/08/2021   Procedure: BIOPSY;  Surgeon: Sharyn Creamer, MD;  Location: [facility] ENDOSCOPY;  Service: Gastroenterology;;   BIOPSY  02/24/2021   Procedure: BIOPSY;  Surgeon: [name], MD;  Location: [facility] ENDOSCOPY;  Service: Gastroenterology;;   BIOPSY  03/26/2021   Procedure: BIOPSY;  Surgeon: Lavena Bullion, DO;  Location: [place];  Service: Gastroenterology;;   BIOPSY  08/06/2021   Procedure: BIOPSY;  Surgeon: [name], MD;  Location: [place];  Service: Gastroenterology;;   BIOPSY  09/15/2021   Procedure: BIOPSY;  Surgeon: Sharyn Creamer, MD;  Location: [place];  Service: Gastroenterology;;   BRONCHIAL BIOPSY  12/26/2021   Procedure: BRONCHIAL BIOPSIES;  Surgeon: Juanito Doom, MD;  Location: [place];  Service: Cardiopulmonary;;   BRONCHIAL WASHINGS  12/26/2021   Procedure: BRONCHIAL WASHINGS;  Surgeon:  Juanito Doom, MD;  Location: [place];  Service: Cardiopulmonary;;   COLONOSCOPY  01/23/2012   Procedure: COLONOSCOPY;  Surgeon: Danie Binder, MD;  [facility];  Service: Endoscopy;  Laterality: N/A;  11:10 AM   COLONOSCOPY WITH PROPOFOL N/A 06/30/2019   Procedure: COLONOSCOPY WITH PROPOFOL;  Surgeon: Ronald Lobo, MD;  Location: [place];   Service: Endoscopy;  Laterality: N/A;   ENTEROSCOPY N/A 08/02/2019   Procedure: ENTEROSCOPY;  Surgeon: Yetta Flock, MD;  Location: [facility] ENDOSCOPY;  Service: Gastroenterology;  Laterality: N/A;   ENTEROSCOPY N/A 02/24/2021   Procedure: ENTEROSCOPY;  Surgeon: [name], MD;  Location: [facility] ENDOSCOPY;  Service: Gastroenterology;  Laterality: N/A;   ENTEROSCOPY N/A 03/26/2021   Procedure: ENTEROSCOPY;  Surgeon: Lavena Bullion, DO;  Location: [place];  Service: Gastroenterology;  Laterality: N/A;   ENTEROSCOPY N/A 07/05/2021   Procedure: ENTEROSCOPY;  Surgeon: Carol Ada, MD;  Location: [place];  Service: Gastroenterology;  Laterality: N/A;   ENTEROSCOPY N/A 08/06/2021   Procedure: ENTEROSCOPY;  Surgeon: [name], MD;  Location: [facility] ENDOSCOPY;  Service: Gastroenterology;  Laterality: N/A;   ENTEROSCOPY N/A 08/24/2021   Procedure: ENTEROSCOPY;  Surgeon: Ladene Artist, MD;  Location: [facility] ENDOSCOPY;  Service: Gastroenterology;  Laterality: N/A;   ENTEROSCOPY N/A 09/15/2021   Procedure: ENTEROSCOPY;  Surgeon: Sharyn Creamer, MD;  Location: [facility] ENDOSCOPY;  Service: Gastroenterology;  Laterality: N/A;   ENTEROSCOPY N/A 10/06/2021   Procedure: ENTEROSCOPY;  Surgeon: [name], MD;  Location: [facility] ENDOSCOPY;  Service: Gastroenterology;  Laterality: N/A;   ENTEROSCOPY N/A 11/14/2021   Procedure: ENTEROSCOPY;  Surgeon: Doran Stabler, MD;  Location: [facility] ENDOSCOPY;  Service: Gastroenterology;  Laterality: N/A;   ESOPHAGOGASTRODUODENOSCOPY N/A 08/10/2020   Procedure: ESOPHAGOGASTRODUODENOSCOPY (EGD);  Surgeon: Jerene Bears, MD;  Location: [facility] ENDOSCOPY;  Service: Gastroenterology;  Laterality: N/A;   ESOPHAGOGASTRODUODENOSCOPY (EGD) WITH PROPOFOL N/A 06/30/2019   Procedure: ESOPHAGOGASTRODUODENOSCOPY (EGD) WITH PROPOFOL;  Surgeon: Ronald Lobo, MD;  Location: [place];  Service: Endoscopy;  Laterality: N/A;   ESOPHAGOGASTRODUODENOSCOPY (EGD) WITH PROPOFOL N/A  01/12/2021   Procedure: ESOPHAGOGASTRODUODENOSCOPY (EGD) WITH PROPOFOL;  Surgeon: Sharyn Creamer, MD;  Location: [place];  Service: Gastroenterology;  Laterality: N/A;   ESOPHAGOGASTRODUODENOSCOPY (EGD) WITH PROPOFOL N/A 02/08/2021   Procedure: ESOPHAGOGASTRODUODENOSCOPY (EGD) WITH PROPOFOL;  Surgeon: Sharyn Creamer, MD;  Location: [place];  Service: Gastroenterology;  Laterality: N/A;   FISTULA SUPERFICIALIZATION Left 10/17/2019   Procedure: LEFT UPPER EXTREMITY FISTULA REVISION, SIDE BRANCH LIGATION,  AND SUPERFICIALIZATION;  Surgeon: Marty Heck, MD;  Location: [place];  Service: Vascular;  Laterality: Left;   FISTULA SUPERFICIALIZATION Left 94/70/9628   Procedure: PLICATION OF ANEURYSM LEFT ARTERIOVENOUS FISTULA;  Surgeon: Angelia Mould, MD;  Location: [place];  Service: Vascular;  Laterality: Left;   FLEXIBLE SIGMOIDOSCOPY N/A 07/05/2021   Procedure: FLEXIBLE SIGMOIDOSCOPY;  Surgeon: Carol Ada, MD;  Location: [place];  Service: Gastroenterology;  Laterality: N/A;   GIVENS CAPSULE STUDY N/A 06/30/2019   Procedure: GIVENS CAPSULE STUDY;  Surgeon: Ronald Lobo, MD;  Location: [place];  Service: Endoscopy;  Laterality: N/A;   GIVENS CAPSULE STUDY N/A 08/25/2021   Procedure: GIVENS CAPSULE STUDY;  Surgeon: Ladene Artist, MD;  Location: [place];  Service: Gastroenterology;  Laterality: N/A;   GIVENS CAPSULE STUDY N/A 10/06/2021   Procedure: GIVENS CAPSULE STUDY;  Surgeon: [name], MD;  Location: [place];  Service: Gastroenterology;  Laterality: N/A;   HEMOSTASIS CLIP PLACEMENT  08/10/2020  Procedure: HEMOSTASIS CLIP PLACEMENT;  Surgeon: Jerene Bears, MD;  Location: [place];  Service: Gastroenterology;;   HEMOSTASIS CLIP PLACEMENT  01/12/2021   Procedure: HEMOSTASIS CLIP PLACEMENT;  Surgeon: Sharyn Creamer, MD;  Location: [place];  Service: Gastroenterology;;   HEMOSTASIS CONTROL  08/02/2019   Procedure: HEMOSTASIS CONTROL;   Surgeon: Yetta Flock, MD;  Location: [place];  Service: Gastroenterology;;   HOT HEMOSTASIS  02/24/2021   Procedure: HOT HEMOSTASIS (ARGON PLASMA COAGULATION/BICAP);  Surgeon: [name], MD;  Location: [facility] ENDOSCOPY;  Service: Gastroenterology;;   HOT HEMOSTASIS N/A 03/26/2021   Procedure: HOT HEMOSTASIS (ARGON PLASMA COAGULATION/BICAP);  Surgeon: Lavena Bullion, DO;  Location: [place] ENDOSCOPY;  Service: Gastroenterology;  Laterality: N/A;   HOT HEMOSTASIS N/A 07/05/2021   Procedure: HOT HEMOSTASIS (ARGON PLASMA COAGULATION/BICAP);  Surgeon: Carol Ada, MD;  Location: [place];  Service: Gastroenterology;  Laterality: N/A;   HOT HEMOSTASIS N/A 09/15/2021   Procedure: HOT HEMOSTASIS (ARGON PLASMA COAGULATION/BICAP);  Surgeon: Sharyn Creamer, MD;  Location: [place];  Service: Gastroenterology;  Laterality: N/A;   HOT HEMOSTASIS N/A 10/06/2021   Procedure: HOT HEMOSTASIS (ARGON PLASMA COAGULATION/BICAP);  Surgeon: [name], MD;  Location: [place];  Service: Gastroenterology;  Laterality: N/A;   INCISION AND DRAINAGE ABSCESS N/A 06/29/2016   Procedure: INCISION AND DRAINAGE ABDOMINAL WALL ABSCESS;  Surgeon: Alphonsa Overall, MD;  Location: [facility];  Service: General;  Laterality: N/A;   INSERTION OF DIALYSIS CATHETER Right 08/03/2019   Procedure: INSERTION OF DIALYSIS CATHETER;  Surgeon: Waynetta Sandy, MD;  Location: [place];  Service: Vascular;  Laterality: Right;   INSERTION OF DIALYSIS CATHETER Right 10/22/2019   Procedure: INSERTION OF 23CM TUNNELED DIALYSIS CATHETER RIGHT INTERNAL JUGULAR;  Surgeon: Angelia Mould, MD;  Location: [place];  Service: Vascular;  Laterality: Right;   INSERTION OF DIALYSIS CATHETER Right 08/12/2020   Procedure: INSERTION OF Right internal Jugular TUNNELED  DIALYSIS CATHETER.;  Surgeon: Waynetta Sandy, MD;  Location: [place];  Service: Vascular;  Laterality: Right;   INSERTION OF DIALYSIS CATHETER N/A  04/19/2021   Procedure: INSERTION OF TUNNELED DIALYSIS CATHETER;  Surgeon: Angelia Mould, MD;  Location: [place];  Service: Vascular;  Laterality: N/A;   IR 3D INDEPENDENT WKST  11/07/2021   IR ANGIOGRAM SELECTIVE EACH ADDITIONAL VESSEL  11/07/2021   IR ANGIOGRAM SELECTIVE EACH ADDITIONAL VESSEL  11/07/2021   IR ANGIOGRAM SELECTIVE EACH ADDITIONAL VESSEL  11/07/2021   IR ANGIOGRAM SELECTIVE EACH ADDITIONAL VESSEL  11/28/2021   IR ANGIOGRAM VISCERAL SELECTIVE  11/07/2021   IR ANGIOGRAM VISCERAL SELECTIVE  11/28/2021   IR EMBO ARTERIAL NOT HEMORR HEMANG INC GUIDE ROADMAPPING  11/07/2021   IR EMBO TUMOR ORGAN ISCHEMIA INFARCT INC GUIDE ROADMAPPING  11/28/2021   IR PARACENTESIS  02/20/2022   IR RADIOLOGIST EVAL & MGMT  09/12/2021   IR US GUIDE VASC ACCESS LEFT  11/07/2021   IR US GUIDE VASC ACCESS LEFT  11/28/2021   Left heel surgery     LOWER EXTREMITY ANGIOGRAPHY N/A 07/08/2021   Procedure: Lower Extremity Angiography;  Surgeon: Cherre Robins, MD;  Location: [place] CV LAB;  Service: Cardiovascular;  Laterality: N/A;   PENILE BIOPSY N/A 03/26/2020   Procedure: PENILE ULCER DEBRIDEMENT;  Surgeon: Remi Haggard, MD;  Location: [facility];  Service: Urology;  Laterality: N/A;  30 MINS   PERIPHERAL VASCULAR INTERVENTION Right 07/08/2021   Procedure: PERIPHERAL VASCULAR INTERVENTION;  Surgeon: Cherre Robins, MD;  Location: [facility] INVASIVE CV  LAB;  Service: Cardiovascular;  Laterality: Right;   SCLEROTHERAPY  01/12/2021   Procedure: SCLEROTHERAPY;  Surgeon: Sharyn Creamer, MD;  Location: [facility] ENDOSCOPY;  Service: Gastroenterology;;   Clide Deutscher  02/24/2021   Procedure: Clide Deutscher;  Surgeon: [name], MD;  Location: [place];  Service: Gastroenterology;;   SUBMUCOSAL TATTOO INJECTION  08/24/2021   Procedure: SUBMUCOSAL TATTOO INJECTION;  Surgeon: Ladene Artist, MD;  Location: [place];  Service: Gastroenterology;;   SUBMUCOSAL TATTOO INJECTION  09/15/2021   Procedure: SUBMUCOSAL  TATTOO INJECTION;  Surgeon: Sharyn Creamer, MD;  Location: [place];  Service: Gastroenterology;;   VIDEO BRONCHOSCOPY N/A 12/26/2021   Procedure: VIDEO BRONCHOSCOPY WITH FLUORO;  Surgeon: Juanito Doom, MD;  Location: [place];  Service: Cardiopulmonary;  Laterality: N/A;   Family History:   Family History  Problem Relation Age of Onset   Colon cancer Neg Hx    Social History:  reports that he has been smoking cigarettes. He has a 22.50 pack-year smoking history. He has never used smokeless tobacco. He reports that he does not currently use alcohol. He reports that he does not currently use drugs after having used the following drugs: "Crack" cocaine. Allergies  Allergen Reactions   Seroquel [Quetiapine] Other (See Comments)    Tardive kinesia Dystonia    Dilaudid [Hydromorphone Hcl] Itching and Other (See Comments)    Pt reports itchiness after IM injection    Prior to Admission medications   Medication Sig Start Date End Date Taking? Authorizing Provider  amLODipine (NORVASC) 5 MG tablet Take 1 tablet (5 mg total) by mouth daily. 04/01/22 06/30/22  Charlott Rakes, MD  atorvastatin (LIPITOR) 40 MG tablet Take 1 tablet (40 mg total) by mouth daily. 04/01/22 06/30/22  Charlott Rakes, MD  Calcium Carbonate Antacid (CALCIUM CARBONATE, DOSED IN MG ELEMENTAL CALCIUM,) 1250 MG/5ML SUSP Take 5 mLs (500 mg of elemental calcium total) by mouth every 6 (six) hours as needed for indigestion. 03/29/22   Charlynne Cousins, MD  diclofenac Sodium (VOLTAREN) 1 % GEL Apply 2 g topically every 6 (six) hours as needed (apply on painful areas). 04/08/22   Shelly Coss, MD  gabapentin (NEURONTIN) 300 MG capsule Take 300 mg by mouth 3 (three) times daily.    [provider]  loperamide (IMODIUM) 2 MG capsule Take 1 capsule (2 mg total) by mouth 4 (four) times daily as needed for diarrhea or loose stools. 01/31/22   Carmin Muskrat, MD  nicotine (NICODERM CQ - DOSED IN MG/24 HOURS) 21  mg/24hr patch Place 1 patch (21 mg total) onto the skin daily. 04/09/22   Shelly Coss, MD  pantoprazole (PROTONIX) 40 MG tablet Take 40 mg by mouth 2 (two) times daily before a meal.    [provider]  traZODone (DESYREL) 50 MG tablet Take 1 tablet (50 mg total) by mouth daily as needed for sleep. 02/10/22   [name], NP  colchicine 0.6 MG tablet Take 0.5 tablets (0.3 mg total) by mouth 2 (two) times daily. 07/24/20 07/29/20  Noemi Chapel, MD  furosemide (LASIX) 40 MG tablet Take 1 tablet (40 mg total) by mouth daily. 07/25/19 02/09/20  Ladona Horns, MD   Current Facility-Administered Medications  Medication Dose Route Frequency Provider Last Rate Last Admin   0.9 %  sodium chloride infusion (Manually program via Guardrails IV Fluids)   Intravenous Once Sherrye Payor A, PA-C       acetaminophen (TYLENOL) tablet 650 mg  650 mg Oral Q6H PRN Holli Humbles  C, MD       Or   acetaminophen (TYLENOL) suppository 650 mg  650 mg Rectal Q6H PRN Little Ishikawa, MD       octreotide (SANDOSTATIN) 2 mcg/mL load via infusion 50 mcg  50 mcg Intravenous Once Beatty, Celeste A, PA-C       And   octreotide (SANDOSTATIN) 500 mcg in sodium chloride 0.9 % 250 mL (2 mcg/mL) infusion  50 mcg/hr Intravenous Continuous Beatty, Celeste A, PA-C       ondansetron (ZOFRAN) tablet 4 mg  4 mg Oral Q6H PRN Little Ishikawa, MD       Or   ondansetron [facility]) injection 4 mg  4 mg Intravenous Q6H PRN Little Ishikawa, MD       pantoprazole (PROTONIX) injection 40 mg  40 mg Intravenous Q12H Vena Rua, PA-C       Current Outpatient Medications  Medication Sig Dispense Refill   amLODipine (NORVASC) 5 MG tablet Take 1 tablet (5 mg total) by mouth daily. 30 tablet 2   atorvastatin (LIPITOR) 40 MG tablet Take 1 tablet (40 mg total) by mouth daily. 30 tablet 2   Calcium Carbonate Antacid (CALCIUM CARBONATE, DOSED IN MG ELEMENTAL CALCIUM,) 1250 MG/5ML SUSP Take 5 mLs (500 mg of  elemental calcium total) by mouth every 6 (six) hours as needed for indigestion. 450 mL 0   diclofenac Sodium (VOLTAREN) 1 % GEL Apply 2 g topically every 6 (six) hours as needed (apply on painful areas). 100 g 0   gabapentin (NEURONTIN) 300 MG capsule Take 300 mg by mouth 3 (three) times daily.     loperamide (IMODIUM) 2 MG capsule Take 1 capsule (2 mg total) by mouth 4 (four) times daily as needed for diarrhea or loose stools. 24 capsule 0   nicotine (NICODERM CQ - DOSED IN MG/24 HOURS) 21 mg/24hr patch Place 1 patch (21 mg total) onto the skin daily. 28 patch 0   pantoprazole (PROTONIX) 40 MG tablet Take 40 mg by mouth 2 (two) times daily before a meal.     traZODone (DESYREL) 50 MG tablet Take 1 tablet (50 mg total) by mouth daily as needed for sleep. 90 tablet 0   Labs: Basic Metabolic Panel: Recent Labs  Lab 04/13/22 2033 04/14/22 1323 04/14/22 1459  NA 128* 130* 130*  K 4.7 5.1 5.3*  CL 92* 93* 92*  CO2 21* 18* 18*  GLUCOSE 112* 104* 104*  BUN 60* 95* 98*  CREATININE 8.79* 9.83* 10.05*  CALCIUM 8.4* 8.5* 8.6*   Liver Function Tests: Recent Labs  Lab 04/10/22 0348 04/14/22 1459  AST 125* 103*  ALT 41 28  ALKPHOS 101 75  BILITOT 1.0 1.5*  PROT 8.9* 7.2  ALBUMIN 1.9* 1.7*   No results for input(s): "LIPASE", "AMYLASE" in the last 168 hours. No results for input(s): "AMMONIA" in the last 168 hours. CBC: Recent Labs  Lab 04/08/22 0414 04/10/22 0348 04/13/22 2033 04/14/22 1323  WBC 3.9* 4.0 4.6 5.8  NEUTROABS  --  2.3  --  4.3  HGB 8.0* 8.4* 7.3* 5.7*  HCT 24.5* 24.3* 22.4* 17.4*  MCV 100.0 98.4 103.2* 102.4*  PLT 121* 131* 117* 123*   Cardiac Enzymes: No results for input(s): "CKTOTAL", "CKMB", "CKMBINDEX", "TROPONINI" in the last 168 hours. CBG: Recent Labs  Lab 04/07/22 1808 04/07/22 2358 04/08/22 0859  GLUCAP 105* 131* 92   Iron Studies: No results for input(s): "IRON", "TIBC", "TRANSFERRIN", "FERRITIN" in the last 72 hours. Studies/Results: DG  Chest Port 1 View  Result Date: 04/14/2022 CLINICAL DATA:  Cough, COVID-19 positive. EXAM: PORTABLE CHEST 1 VIEW COMPARISON:  April 10, 2022. FINDINGS: Stable cardiomegaly. Stable bilateral upper lobe airspace opacities are noted, left greater than right, consistent with pneumonia. Stable bibasilar atelectasis or infiltrates are noted with associated pleural effusions. Bony thorax is unremarkable. IMPRESSION: Stable bilateral lung opacities and pleural effusions as described above. Electronically Signed   By: Marijo Conception M.D.   On: 04/14/2022 13:25    ROS: Pertinent items are noted in HPI. Physical Exam: Vitals:   04/14/22 1308 04/14/22 1415 04/14/22 1430 04/14/22 1445  BP: 115/85 121/66  125/87  Pulse: (!) 102 98 (!) 102 98  Resp: '18  17 15  '$ Temp: (!) 97.5 F (36.4 C)     TempSrc: Oral     SpO2: 96% 97% 92% 93%      Weight change:  No intake or output data in the 24 hours ending 04/14/22 1625 BP 125/87   Pulse 98   Temp (!) 97.5 F (36.4 C) (Oral)   Resp 15   SpO2 93%  Physical exam: unable to complete due to COVID + status.  In order to preserve PPE equipment and to minimize exposure to providers.  Notes from other caregivers reviewed   Dialysis Access: AVF  Dialysis Orders:  MWF [place] 4hr, 400/A1.5, EDW 98.8kg, 2K/2Ca, AVF, no heparin - Hectoral 74mg IV q HD - Appears to get IV iron with oncology, no ESA d/t cancer  Assessment/Plan:  ABLA - acute on chronic with evidence of GI bleed.  Gi consulted and plan for blood transfusion.  Covid-19 Positive - without hypoxia but does have N/V/D.  Quarantine for 5 days per protocol.  Treatment per primary svc  ESRD -  plan for HD today if schedule allows.  Hypertension/volume  - stable  Anemia  - as above, for blood transfusion and follow.  Metabolic bone disease -  continue with meds  Nutrition - renal diet  Medical noncompliance - multiple admissions with ongoing issue  Alcohol abuse disorder - high risk for withdrawal,  per primary  Hyperkalemia - mild and due to noncompliance with HD and GIB.  Will start lokelma and plan for HD.  JDonetta Potts MD [place]12/18/2023, 4:25 PM

## 2022-04-15 ENCOUNTER — Encounter (HOSPITAL_COMMUNITY)
Admission: EM | Disposition: A | Discharge status: Skilled Nursing Facility | Payer: Self-pay | Source: Home / Self Care | Attending: Critical Care Medicine

## 2022-04-15 ENCOUNTER — Inpatient Hospital Stay (HOSPITAL_COMMUNITY): Payer: Medicare Other

## 2022-04-15 ENCOUNTER — Other Ambulatory Visit (HOSPITAL_COMMUNITY): Payer: Self-pay

## 2022-04-15 ENCOUNTER — Encounter: Payer: Self-pay | Admitting: *Deleted

## 2022-04-15 DIAGNOSIS — K5520 Angiodysplasia of colon without hemorrhage: Secondary | ICD-10-CM | POA: Diagnosis not present

## 2022-04-15 DIAGNOSIS — D62 Acute posthemorrhagic anemia: Secondary | ICD-10-CM | POA: Diagnosis not present

## 2022-04-15 DIAGNOSIS — J9601 Acute respiratory failure with hypoxia: Secondary | ICD-10-CM | POA: Diagnosis not present

## 2022-04-15 DIAGNOSIS — J1282 Pneumonia due to coronavirus disease 2019: Secondary | ICD-10-CM

## 2022-04-15 DIAGNOSIS — D649 Anemia, unspecified: Secondary | ICD-10-CM | POA: Diagnosis not present

## 2022-04-15 DIAGNOSIS — R4182 Altered mental status, unspecified: Secondary | ICD-10-CM | POA: Diagnosis not present

## 2022-04-15 DIAGNOSIS — J810 Acute pulmonary edema: Secondary | ICD-10-CM

## 2022-04-15 DIAGNOSIS — Z9911 Dependence on respirator [ventilator] status: Secondary | ICD-10-CM

## 2022-04-15 DIAGNOSIS — U071 COVID-19: Secondary | ICD-10-CM | POA: Diagnosis not present

## 2022-04-15 DIAGNOSIS — K3189 Other diseases of stomach and duodenum: Secondary | ICD-10-CM

## 2022-04-15 DIAGNOSIS — K766 Portal hypertension: Secondary | ICD-10-CM | POA: Diagnosis not present

## 2022-04-15 LAB — CBC
HCT: 21.5 % — ABNORMAL LOW (ref 39.0–52.0)
HCT: 19.9 % — ABNORMAL LOW (ref 39.0–52.0)
Hemoglobin: 7.6 g/dL — ABNORMAL LOW (ref 13.0–17.0)
Hemoglobin: 6.7 g/dL — CL (ref 13.0–17.0)
MCH: 33 pg (ref 26.0–34.0)
MCH: 32.5 pg (ref 26.0–34.0)
MCHC: 35.3 g/dL (ref 30.0–36.0)
MCHC: 33.7 g/dL (ref 30.0–36.0)
MCV: 93.5 fL (ref 80.0–100.0)
MCV: 96.6 fL (ref 80.0–100.0)
Platelets: 103 10*3/uL — ABNORMAL LOW (ref 150–400)
Platelets: 107 10*3/uL — ABNORMAL LOW (ref 150–400)
RBC: 2.3 MIL/uL — ABNORMAL LOW (ref 4.22–5.81)
RBC: 2.06 MIL/uL — ABNORMAL LOW (ref 4.22–5.81)
RDW: 18.5 % — ABNORMAL HIGH (ref 11.5–15.5)
RDW: 18.7 % — ABNORMAL HIGH (ref 11.5–15.5)
WBC: 5.3 10*3/uL (ref 4.0–10.5)
WBC: 4.8 10*3/uL (ref 4.0–10.5)
nRBC: 0 % (ref 0.0–0.2)
nRBC: 0 % (ref 0.0–0.2)

## 2022-04-15 LAB — MAGNESIUM: Magnesium: 1.6 mg/dL — ABNORMAL LOW (ref 1.7–2.4)

## 2022-04-15 LAB — PROTIME-INR
INR: 1.2 (ref 0.8–1.2)
INR: 1.4 — ABNORMAL HIGH (ref 0.8–1.2)
Prothrombin Time: 15.5 seconds — ABNORMAL HIGH (ref 11.4–15.2)
Prothrombin Time: 17 seconds — ABNORMAL HIGH (ref 11.4–15.2)

## 2022-04-15 LAB — PROCALCITONIN: Procalcitonin: 2.07 ng/mL

## 2022-04-15 LAB — RENAL FUNCTION PANEL
Albumin: <1.5 g/dL — ABNORMAL LOW (ref 3.5–5.0)
Anion gap: 12 (ref 5–15)
BUN: 61 mg/dL — ABNORMAL HIGH (ref 8–23)
CO2: 26 mmol/L (ref 22–32)
Calcium: 7.8 mg/dL — ABNORMAL LOW (ref 8.9–10.3)
Chloride: 93 mmol/L — ABNORMAL LOW (ref 98–111)
Creatinine, Ser: 6.56 mg/dL — ABNORMAL HIGH (ref 0.61–1.24)
GFR, Estimated: 9 mL/min — ABNORMAL LOW (ref >60)
Glucose, Bld: 78 mg/dL (ref 70–99)
Phosphorus: 4.2 mg/dL (ref 2.5–4.6)
Potassium: 3.7 mmol/L (ref 3.5–5.1)
Sodium: 131 mmol/L — ABNORMAL LOW (ref 135–145)

## 2022-04-15 LAB — BRAIN NATRIURETIC PEPTIDE: B Natriuretic Peptide: 141.5 pg/mL — ABNORMAL HIGH (ref 0.0–100.0)

## 2022-04-15 LAB — COMPREHENSIVE METABOLIC PANEL
ALT: 33 U/L (ref 0–44)
AST: 117 U/L — ABNORMAL HIGH (ref 15–41)
Albumin: 1.6 g/dL — ABNORMAL LOW (ref 3.5–5.0)
Alkaline Phosphatase: 66 U/L (ref 38–126)
Anion gap: 9 (ref 5–15)
BUN: 52 mg/dL — ABNORMAL HIGH (ref 8–23)
CO2: 27 mmol/L (ref 22–32)
Calcium: 7.8 mg/dL — ABNORMAL LOW (ref 8.9–10.3)
Chloride: 96 mmol/L — ABNORMAL LOW (ref 98–111)
Creatinine, Ser: 5.53 mg/dL — ABNORMAL HIGH (ref 0.61–1.24)
GFR, Estimated: 11 mL/min — ABNORMAL LOW (ref >60)
Glucose, Bld: 70 mg/dL (ref 70–99)
Potassium: 3.3 mmol/L — ABNORMAL LOW (ref 3.5–5.1)
Sodium: 132 mmol/L — ABNORMAL LOW (ref 135–145)
Total Bilirubin: 1.5 mg/dL — ABNORMAL HIGH (ref 0.3–1.2)
Total Protein: 6.6 g/dL (ref 6.5–8.1)

## 2022-04-15 LAB — FIBRINOGEN: Fibrinogen: 219 mg/dL (ref 210–475)

## 2022-04-15 LAB — GLUCOSE, CAPILLARY
Glucose-Capillary: 84 mg/dL (ref 70–99)
Glucose-Capillary: 64 mg/dL — ABNORMAL LOW (ref 70–99)
Glucose-Capillary: 93 mg/dL (ref 70–99)
Glucose-Capillary: 73 mg/dL (ref 70–99)
Glucose-Capillary: 69 mg/dL — ABNORMAL LOW (ref 70–99)
Glucose-Capillary: 85 mg/dL (ref 70–99)
Glucose-Capillary: 75 mg/dL (ref 70–99)
Glucose-Capillary: 71 mg/dL (ref 70–99)

## 2022-04-15 LAB — LACTIC ACID, PLASMA: Lactic Acid, Venous: 1.7 mmol/L (ref 0.5–1.9)

## 2022-04-15 LAB — MRSA NEXT GEN BY PCR, NASAL: MRSA by PCR Next Gen: NOT DETECTED

## 2022-04-15 LAB — TRIGLYCERIDES: Triglycerides: 129 mg/dL (ref <150)

## 2022-04-15 LAB — BLOOD PRODUCT ORDER (VERBAL) VERIFICATION

## 2022-04-15 LAB — PREPARE RBC (CROSSMATCH)

## 2022-04-15 SURGERY — EGD (ESOPHAGOGASTRODUODENOSCOPY)
Anesthesia: Moderate Sedation | Laterality: Left

## 2022-04-15 MED ORDER — SODIUM CHLORIDE 0.9 % IV SOLN
100.0000 mg | Freq: Every day | INTRAVENOUS | Status: AC
  Administered 2022-04-16 – 2022-04-17 (×2): 100 mg via INTRAVENOUS
  Filled 2022-04-15 (×2): qty 20

## 2022-04-15 MED ORDER — SODIUM CHLORIDE 0.9 % IV SOLN
200.0000 mg | Freq: Once | INTRAVENOUS | Status: AC
  Administered 2022-04-15: 200 mg via INTRAVENOUS
  Filled 2022-04-15: qty 40

## 2022-04-15 MED ORDER — DEXTROSE 50 % IV SOLN
INTRAVENOUS | Status: AC
  Filled 2022-04-15: qty 50

## 2022-04-15 MED ORDER — ORAL CARE MOUTH RINSE: 15.0000 mL | OROMUCOSAL | Status: DC | PRN

## 2022-04-15 MED ORDER — ADULT MULTIVITAMIN W/MINERALS CH
1.0000 | ORAL_TABLET | Freq: Every day | ORAL | Status: DC
  Administered 2022-04-15 – 2022-04-16 (×2): 1
  Filled 2022-04-15 (×3): qty 1

## 2022-04-15 MED ORDER — SODIUM CHLORIDE 0.9 % IV SOLN
2.0000 g | INTRAVENOUS | Status: DC
  Administered 2022-04-15 – 2022-04-21 (×7): 2 g via INTRAVENOUS
  Filled 2022-04-15 (×7): qty 20

## 2022-04-15 MED ORDER — SODIUM CHLORIDE 0.9% IV SOLUTION: Freq: Once | INTRAVENOUS | Status: AC

## 2022-04-15 MED ORDER — THIAMINE MONONITRATE 100 MG PO TABS
100.0000 mg | ORAL_TABLET | Freq: Every day | ORAL | Status: DC
  Administered 2022-04-15 – 2022-04-16 (×2): 100 mg
  Filled 2022-04-15 (×3): qty 1

## 2022-04-15 MED ORDER — DEXTROSE 50 % IV SOLN
12.5000 g | Freq: Once | INTRAVENOUS | Status: AC
  Administered 2022-04-15: 12.5 g via INTRAVENOUS

## 2022-04-15 MED ORDER — VANCOMYCIN HCL 2000 MG/400ML IV SOLN
2000.0000 mg | Freq: Once | INTRAVENOUS | Status: AC
  Administered 2022-04-15: 2000 mg via INTRAVENOUS
  Filled 2022-04-15: qty 400

## 2022-04-15 MED ORDER — FOLIC ACID 1 MG PO TABS
1.0000 mg | ORAL_TABLET | Freq: Every day | ORAL | Status: DC
  Administered 2022-04-15 – 2022-04-16 (×2): 1 mg
  Filled 2022-04-15 (×3): qty 1

## 2022-04-15 MED ORDER — ORAL CARE MOUTH RINSE
15.0000 mL | OROMUCOSAL | Status: DC
  Administered 2022-04-15 – 2022-04-17 (×27): 15 mL via OROMUCOSAL

## 2022-04-15 MED ORDER — VANCOMYCIN VARIABLE DOSE PER UNSTABLE RENAL FUNCTION (PHARMACIST DOSING): Status: DC

## 2022-04-15 MED ORDER — DEXTROSE 10 % IV SOLN
INTRAVENOUS | Status: DC
  Filled 2022-04-15: qty 1000

## 2022-04-15 MED ORDER — DEXTROSE 50 % IV SOLN
INTRAVENOUS | Status: AC
  Administered 2022-04-15: 25 mL
  Filled 2022-04-15: qty 50

## 2022-04-15 MED FILL — Sodium Chloride IV Soln 0.9%: INTRAVENOUS | Qty: 200 | Status: AC

## 2022-04-15 MED FILL — Fentanyl Citrate Preservative Free (PF) Inj 2500 MCG/50ML: INTRAMUSCULAR | Qty: 50 | Status: AC

## 2022-04-15 NOTE — Consult Note (Signed)
   [facility] CM Inpatient Consult   04/15/2022  LORIE MELICHAR 11-21-1960 [id number]  [place] Organization [ACO] Patient: Medicare ACO REACH  Primary Care Provider:  [name], NP with [facility]  Patient is currently active with [facility] for chronic disease management services.  Patient has been engaged by a [facility] NP-[facility].  Our community based plan of care has focused on disease management and community resource support.    Patient is currently at ICU level of care, acknowledgement of his less than 7 days  readmission with extreme high risk scores for unplanned readmission.  Plan: Continue to monitor for [facility] Care Management and update [facility] NP-G of admission. Patient is sedated and intubated per progress notes.  Of note, [facility] Care Management services does not replace or interfere with any services that are needed or arranged by inpatient [facility] care management team.   For additional questions or referrals please contact:  Natividad Brood, RN BSN [place]  [phone] business mobile phone Toll free office [phone]  *[place]  [phone] Fax number: [phone] [email or web address]

## 2022-04-15 NOTE — Progress Notes (Signed)
eLink Physician-Brief Progress Note Patient Name: Daniel [name] DOB: [birth year] MRN: [id number]   Date of Service  04/15/2022  HPI/Events of Note  Lactic Acid = 4.0. BP now = 108/66 with MAP = 80. Hgb = 7.1.   eICU Interventions  Plan: Continue to trend Lactic Acid with improved BP.      Intervention Category Major Interventions: Acid-Base disturbance - evaluation and management  [name] [place] 04/15/2022, 1:45 AM

## 2022-04-15 NOTE — Progress Notes (Addendum)
NAME:  Daniel [name], MRN:  [id number], DOB:  1960/08/01, LOS: 1 ADMISSION DATE:  04/14/2022, CONSULTATION DATE:  04/14/2022 REFERRING MD:  Dr. Avon Gully, CHIEF COMPLAINT:  UGIB    History of Present Illness:   [name], past medical history of peripheral arterial disease, dialysis patient Monday, end-stage renal disease, type 2 diabetes hepatocellular carcinoma status post radiation. seen in pulm clinic before for a lung nodule which on bronchoscopic eval / path was not c/w malignancy .  He has had GI bleeding in the past with 2 duodenal ulcers a AVM alcoholic cirrhosis with his known history hepatocellular carcinoma.  In the ER he was also found to be COVID-positive.  He ultimately had a large volume of bloody emesis in the emergency room he had respiratory failure and was intubated.  Pulmonary critical care was consulted for ICU was consulted for admission.  He had already been seen by hospitalist and admitted with GI consultation and IR but declined while he was in theEmergency department.   Pertinent Medical History   Past Medical History:  Diagnosis Date   Anemia    Diabetes mellitus without complication ([place])    patient denies   Dialysis patient [facility])    End stage chronic kidney disease ([place])    Hypertension    ICH (intracerebral hemorrhage) ([place]) 05/20/2017   PAD (peripheral artery disease) (HCC)    Shoulder pain, left 06/28/2013     Significant Hospital Events: Including procedures, antibiotic start and stop dates in addition to other pertinent events   12/17 covid + with generalized body sx, left ama 12/18 admitted with GIB. Intubated for hematemesis 12/19 starting on covid therapies   Interim History / Subjective:  Intubated yesterday  HD this morning   Objective   Blood pressure 105/64, pulse 70, temperature 97.9 F (36.6 C), resp. rate 17, weight 99.2 kg, SpO2 100 %.    Vent Mode: PRVC FiO2 (%):  [40 %-60 %] 40 % Set Rate:  [15 bmp]  15 bmp Vt Set:  [600 mL] 600 mL PEEP:  [5 cmH20-8 cmH20] 8 cmH20 Plateau Pressure:  [21 cmH20-25 cmH20] 21 cmH20   Intake/Output Summary (Last 24 hours) at 04/15/2022 1319 Last data filed at 04/15/2022 1215 Gross per 24 hour  Intake 1672.36 ml  Output 2400 ml  Net -727.64 ml   Filed Weights   04/15/22 0812  Weight: 99.2 kg    Examination: General: critically and chronically ill appearing M intubated sedated  HENT: ETT secure. Anicteric sclera Lungs: Coarse. Mechanically ventilated  Cardiovascular: rr s1s2  Abdomen: soft ndnt  Extremities: no acute joint deformity Neuro: sedated GU: defer  Resolved Hospital Problem list   Lactic acidosis, improved   Assessment & Plan:   ABLA superimposed on anemia of chronic dz Hx small bowel AVMs ?UGIB  Acute on chronic Thrombocytopenia  Plan: -GI following, medical management at present -Octreotide, protonix  -trend CBC -transfuse per protocol. Still to receive plt ordered 12/18  -rocephin   Acute hypoxemic respiratory failure COVID-19 PNA Pleural effusion Plan: -airborne iso x5d  -starting remdesivir 12/19 -not a great candidate for steroids with GI bleeding.  -will send a trach aspirate and start empiric abx, de-escalate as able (will do vanc, rocephin to start) -Cont MV support  -VAP, pulm hygiene  -volume management via iHD  ESRD on iHD Hyponatremia Hypokalemia Plan: -HD per nephro   Hx hepatocellular carcinoma s/p Y90 (11/28/21) Cirrhosis Hx HCV, untreated Etoh use disorder  Hyperammonemia Plan: -GI following  -  follow LFTs, coags -will recheck ammonia -as above, rocephin   ? Seizure like activity  - this occurred after large volume bloody vomitus. No sz on EEG, is likely this was not a sz but other convulsion in context of preceding event .  P: -monitoring   Anisocoria  -CT H no acute abnormality -metabolic tx as above   Borderline hypoglycemia -might need dextrose until ok for  EN  GOC -palliative care consult placed 12/19. Lots of comorbid conditions + some issues with noncompliance at baseline. Think palli consult to detr GOC would be very helpful.    Best Practice (right click and "Reselect all SmartList Selections" daily)   Diet/type: NPO DVT prophylaxis: SCD GI prophylaxis: PPI Lines: Central line Foley:  Yes, and it is still needed Code Status:  full code Last date of multidisciplinary goals of care discussion [we will reach out to his family]  Labs   CBC: Recent Labs  Lab 04/10/22 0348 04/13/22 2033 04/14/22 1323 04/14/22 1837 04/14/22 1939 04/15/22 1052  WBC 4.0 4.6 5.8 6.5  --  5.3  NEUTROABS 2.3  --  4.3  --   --   --   HGB 8.4* 7.3* 5.7* 7.3* 7.1* 7.6*  HCT 24.3* 22.4* 17.4* 21.7* 21.0* 21.5*  MCV 98.4 103.2* 102.4* 98.2  --  93.5  PLT 131* 117* 123* 121*  --  103*    Basic Metabolic Panel: Recent Labs  Lab 04/10/22 0348 04/13/22 2033 04/14/22 1323 04/14/22 1459 04/14/22 1939 04/15/22 0840  NA 126* 128* 130* 130* 131* 132*  K 4.8 4.7 5.1 5.3* 5.6* 3.3*  CL 90* 92* 93* 92*  --  96*  CO2 23 21* 18* 18*  --  27  GLUCOSE 102* 112* 104* 104*  --  70  BUN 42* 60* 95* 98*  --  52*  CREATININE 8.62* 8.79* 9.83* 10.05*  --  5.53*  CALCIUM 8.4* 8.4* 8.5* 8.6*  --  7.8*   GFR: Estimated Creatinine Clearance: 16.6 mL/min (A) (by C-G formula based on SCr of 5.53 mg/dL (H)). Recent Labs  Lab 04/13/22 2033 04/14/22 1323 04/14/22 1459 04/14/22 1837 04/15/22 0840 04/15/22 1052  WBC 4.6 5.8  --  6.5  --  5.3  LATICACIDVEN  --   --  5.9* 4.0* 1.7  --     Liver Function Tests: Recent Labs  Lab 04/10/22 0348 04/14/22 1459 04/15/22 0840  AST 125* 103* 117*  ALT 41 28 33  ALKPHOS 101 75 66  BILITOT 1.0 1.5* 1.5*  PROT 8.9* 7.2 6.6  ALBUMIN 1.9* 1.7* 1.6*   Recent Labs  Lab 04/14/22 1459  LIPASE 57*   Recent Labs  Lab 04/14/22 1837  AMMONIA 45*    ABG    Component Value Date/Time   PHART 7.377 04/14/2022 1939    PCO2ART 36.5 04/14/2022 1939   PO2ART 118 (H) 04/14/2022 1939   HCO3 21.7 04/14/2022 1939   TCO2 23 04/14/2022 1939   ACIDBASEDEF 3.0 (H) 04/14/2022 1939   O2SAT 99 04/14/2022 1939     Coagulation Profile: Recent Labs  Lab 04/14/22 1459 04/15/22 0840  INR 1.3* 1.2    Cardiac Enzymes: No results for input(s): "CKTOTAL", "CKMB", "CKMBINDEX", "TROPONINI" in the last 168 hours.  HbA1C: Hemoglobin A1C  Date/Time Value Ref Range Status  09/03/2021 09:31 AM 5.2 4.0 - 5.6 % Final  09/26/2020 09:30 AM 5.4 4.0 - 5.6 % Final   Hgb A1c MFr Bld  Date/Time Value Ref Range Status  04/06/2022 08:00  AM 5.1 4.8 - 5.6 % Final    Comment:    (NOTE)         Prediabetes: 5.7 - 6.4         Diabetes: >6.4         Glycemic control for adults with diabetes: <7.0   03/26/2020 10:22 AM 6.4 (H) 4.8 - 5.6 % Final    Comment:    (NOTE) Pre diabetes:          5.7%-6.4%  Diabetes:              >6.4%  Glycemic control for   <7.0% adults with diabetes     CBG: Recent Labs  Lab 04/14/22 2311 04/15/22 0356 04/15/22 0851 04/15/22 0915 04/15/22 1226  GLUCAP 102* 84 64* 93 73    CRITICAL CARE Performed by: Cristal Generous   Total critical care time: 42 minutes  Critical care time was exclusive of separately billable procedures and treating other patients. Critical care was necessary to treat or prevent imminent or life-threatening deterioration.  Critical care was time spent personally by me on the following activities: development of treatment plan with patient and/or surrogate as well as nursing, discussions with consultants, evaluation of patient's response to treatment, examination of patient, obtaining history from patient or surrogate, ordering and performing treatments and interventions, ordering and review of laboratory studies, ordering and review of radiographic studies, pulse oximetry and re-evaluation of patient's condition.  Eliseo Gum MSN, AGACNP-BC [place] for pager  04/15/2022, 1:19 PM

## 2022-04-15 NOTE — TOC Benefit Eligibility Note (Signed)
Patient Teacher, English as a foreign language completed.    The patient is currently admitted and upon discharge could be taking Sandostatin (Octreotide) LAR Dep[place] 20 mg injection.  Requires Prior Authorization  The patient is insured through [place], [place] Patient Advocate Specialist [place] Patient Advocate Team Direct Number: [phone]  Fax: [phone]

## 2022-04-15 NOTE — Procedures (Signed)
Patient Name: Daniel [name]  MRN: [id number]  Epilepsy Attending: Lora Havens  Referring Physician/Provider: Cristal Generous, NP  Date: 04/14/2022 Duration: 24.10 mins  Patient history: 61 year old male with altered mental status.  EEG Drolet for seizure.  Level of alertness: comatose  AEDs during EEG study: Propofol  Technical aspects: This EEG study was done with scalp electrodes positioned according to the 10-20 International system of electrode placement. Electrical activity was reviewed with band pass filter of 1-'70Hz'$ , sensitivity of 7 uV/mm, display speed of 48m/sec with a '60Hz'$  notched filter applied as appropriate. EEG data were recorded continuously and digitally stored.  Video monitoring was available and reviewed as appropriate.  Description: EEG showed near continuous generalized 3 to 6 Hz theta-delta slowing.  Intermittent generalized 13 to 15 Hz beta activity was also noted.  Hyperventilation and photic stimulation were not performed.     ABNORMALITY - Continuous slow, generalized  IMPRESSION: This study is suggestive of moderate to severe diffuse encephalopathy, nonspecific etiology. No seizures or epileptiform discharges were seen throughout the recording.  Kiree Dejarnette OBarbra Sarks

## 2022-04-15 NOTE — Progress Notes (Signed)
eLink Physician-Brief Progress Note Patient Name: Daniel [name] DOB: 1960-09-17 MRN: [id number]   Date of Service  04/15/2022  HPI/Events of Note  Nursing reports that pupil size is unequal. R pupil 2 mm and L pupil 3 mm. Both pupils reactive, but sluggish. On ventilator and sedated with a Propofol IV infusion.   eICU Interventions  Plan: Head CT Scan w/o contrast STAT.     Intervention Category Major Interventions: Change in mental status - evaluation and management  Justene Jensen Eugene 04/15/2022, 12:46 AM

## 2022-04-15 NOTE — Procedures (Signed)
I was present at this dialysis session. I have reviewed the session itself and made appropriate changes.   Vital signs in last 24 hours:  Temp:  [96.7 F (35.9 C)-97.7 F (36.5 C)] 97.1 F (36.2 C) (12/19 0400) Pulse Rate:  [68-117] 76 (12/19 0815) Resp:  [6-33] 15 (12/19 0815) BP: (76-203)/(59-106) 105/65 (12/19 0815) SpO2:  [91 %-100 %] 100 % (12/19 0815) FiO2 (%):  [40 %-60 %] 40 % (12/19 0337) Weight change:  There were no vitals filed for this visit.  Recent Labs  Lab 04/14/22 1459 04/14/22 1939  NA 130* 131*  K 5.3* 5.6*  CL 92*  --   CO2 18*  --   GLUCOSE 104*  --   BUN 98*  --   CREATININE 10.05*  --   CALCIUM 8.6*  --     Recent Labs  Lab 04/10/22 0348 04/13/22 2033 04/14/22 1323 04/14/22 1837 04/14/22 1939  WBC 4.0 4.6 5.8 6.5  --   NEUTROABS 2.3  --  4.3  --   --   HGB 8.4* 7.3* 5.7* 7.3* 7.1*  HCT 24.3* 22.4* 17.4* 21.7* 21.0*  MCV 98.4 103.2* 102.4* 98.2  --   PLT 131* 117* 123* 121*  --     Scheduled Meds:  Chlorhexidine Gluconate Cloth  6 each Topical Q0600   docusate  100 mg Per Tube BID   mouth rinse  15 mL Mouth Rinse Q2H   pantoprazole  40 mg Intravenous Q12H   polyethylene glycol  17 g Per Tube Daily   sodium zirconium cyclosilicate  10 g Oral Daily   Continuous Infusions:  anticoagulant sodium citrate     fentaNYL infusion INTRAVENOUS 50 mcg/hr (04/15/22 0400)   octreotide (SANDOSTATIN) 500 mcg in sodium chloride 0.9 % 250 mL (2 mcg/mL) infusion 50 mcg/hr (04/15/22 0400)   propofol (DIPRIVAN) infusion 50 mcg/kg/min (04/15/22 0502)   PRN Meds:.acetaminophen **OR** acetaminophen, alteplase, anticoagulant sodium citrate, docusate sodium, fentaNYL, heparin, lidocaine (PF), lidocaine-prilocaine, ondansetron **OR** ondansetron (ZOFRAN) IV, mouth rinse, pentafluoroprop-tetrafluoroeth, polyethylene glycol   Donetta Potts,  MD 04/15/2022, 8:26 AM

## 2022-04-15 NOTE — Progress Notes (Signed)
Pharmacy Antibiotic Note  Daniel [name] is a 61 y.o. male with pneumonia.  Pharmacy has been consulted for vancomycin dosing (he is also on rocephin). He is noted with ESRD on HD MWF (last HD was 12/19) -WBC= 5.3, afebrile -cultures ordered  Plan: -Vancomycin '2000mg'$  IV x1 then '1000mg'$  IV after HD -Will order the vancomycin maintenance dose one HD plans known   Weight: 99.2 kg (218 lb 11.1 oz)  Temp (24hrs), Avg:97.3 F (36.3 C), Min:96.4 F (35.8 C), Max:97.9 F (36.6 C)  Recent Labs  Lab 04/10/22 0348 04/13/22 2033 04/14/22 1323 04/14/22 1459 04/14/22 1837 04/15/22 0840 04/15/22 1052  WBC 4.0 4.6 5.8  --  6.5  --  5.3  CREATININE 8.62* 8.79* 9.83* 10.05*  --  5.53*  --   LATICACIDVEN  --   --   --  5.9* 4.0* 1.7  --     Estimated Creatinine Clearance: 16.6 mL/min (A) (by C-G formula based on SCr of 5.53 mg/dL (H)).    Allergies  Allergen Reactions   Seroquel [Quetiapine] Other (See Comments)    Tardive kinesia Dystonia    Dilaudid [Hydromorphone Hcl] Itching and Other (See Comments)    Pt reports itchiness after IM injection     Antimicrobials this admission: 12/19 ceftriaxone 12/19 vancomycin  Dose adjustments this admission:   Microbiology results: 12/19 MRSA PCR 12/19 resp cx  Thank you for allowing pharmacy to be a part of this patient's care.  Hildred Laser, PharmD Clinical Pharmacist **Pharmacist phone directory can now be found on [email or web address] ([email or web address]).  Listed under [place].

## 2022-04-15 NOTE — Progress Notes (Signed)
Hypoglycemic Event  CBG: 64  Treatment: D50 25 mL (12.5 gm)  Symptoms: None  Follow-up CBG: [id number] CBG Result:93  Possible Reasons for Event: Inadequate meal intake  Comments/MD notified:Laura Carlis Abbott, DO notified     Daniel [name]

## 2022-04-15 NOTE — Progress Notes (Addendum)
Hb 6.7-- giving 1 unit pRBC. Needs H/H after transfusion. Monotr for ongoing bleeding Check INR, fibrinogen  Julian Hy, DO 04/15/22 6:19 PM [facility]    NSVT on tele. Checking RFP, Mg+ No signs of bleeding today.  Julian Hy, DO 04/15/22 6:40 PM [facility]

## 2022-04-15 NOTE — TOC Initial Note (Signed)
Transition of Care [facility]) - Initial/Assessment Note    Patient Details  Name: Daniel [name] MRN: [id number] Date of Birth: 1960-06-06  [facility]) CM/SW Contact:    Bethena Roys, RN Phone Number: 04/15/2022, 3:56 PM  Clinical Narrative: Patient is currently on the vent-unable to address the risk for readmission assessment at this time. Patient was discussed in morning rounds- on IV Rocephin- per notes acute respiratory failure (Covid infection, likely pneumonia).  Patient is currently active with [facility] for RN, PT,OT services. Patient's PCP is at the [facility]. Case Manager will continue to follow for transition of care needs.   Expected Discharge Plan: [place] Barriers to Discharge: Continued Medical Work up   Patient Goals and CMS Choice Patient states their goals for this hospitalization and ongoing recovery are:: to return home.      Expected Discharge Plan and Services Expected Discharge Plan: [name] In-house Referral: NA Discharge Planning Services: CM Consult Post Acute Care Choice: Home Health, Resumption of Svcs/PTA Provider Living arrangements for the past 2 months: Single Family Home                 DME Arranged: N/A     HH Arranged: RN, Disease Management, PT, OT HH Agency: [facility] Date [place]: 04/15/22 Time [place]: [id number] Representative spoke with at [place]: Kerry Dory.  Prior Living Arrangements/Services Living arrangements for the past 2 months: Single Family Home Lives with:: Self Patient language and need for interpreter reviewed:: Yes Do you feel safe going back to the place where you live?: Yes      Need for Family Participation in Patient Care: Yes (Comment) Care giver support system in place?: Yes (comment) Current home services: DME (wheelchair and slide board.) Criminal Activity/Legal Involvement Pertinent to Current  Situation/Hospitalization: No - Comment as needed  Permission Sought/Granted Permission sought to share information with : Case Manager, Customer service manager       Permission granted to share info w AGENCY: [facility] called Case Manager to make aware.         Emotional Assessment Appearance:: Appears stated age       Alcohol / Substance Use: Not Applicable Psych Involvement: No (comment)  Admission diagnosis:  GIB (gastrointestinal bleeding) [K92.2] Gastrointestinal hemorrhage with melena [K92.1] Symptomatic anemia [D64.9] Anemia, unspecified type [D64.9] Patient Active Problem List   Diagnosis Date Noted   Pneumonia due to COVID-19 virus 04/15/2022   On mechanically assisted ventilation ([place]) 04/15/2022   Symptomatic anemia 04/14/2022   GIB (gastrointestinal bleeding) 04/14/2022   Fluid overload 04/06/2022   Acute respiratory failure with hypoxia ([place]) 03/24/2022   Hypervolemia associated with renal insufficiency 03/23/2022   Decompensated HCV cirrhosis (HCC)    Other ascites    Cirrhosis of liver with ascites ([place])    Acute cardiogenic pulmonary edema ([place]) 01/02/2022   Bilateral pleural effusion 01/02/2022   Volume overload 12/25/2021   Acute on chronic anemia    Hyponatremia 12/09/2021   Mixed hyperlipidemia 11/13/2021   Musculoskeletal neck pain 11/13/2021   ABLA (acute blood loss anemia) 11/12/2021   Iron deficiency anemia due to chronic blood loss 10/17/2021   Euthyroid sick syndrome 10/10/2021   Elevated troponin 10/10/2021   LLQ abdominal pain 10/08/2021   Physical deconditioning 10/08/2021   Cognitive decline 10/08/2021   Left flank pain 10/07/2021   Intestinal polyps 10/06/2021   Portal hypertensive gastropathy ([place]) 10/06/2021  Rotator cuff syndrome 10/05/2021   Epistaxis 10/05/2021   Hx of BKA, left ([place]) 10/05/2021   Edema of amputation stump of left lower extremity (HCC) 09/28/2021   Chronic pain disorder  09/28/2021   Amputation stump infection ([place])    Sepsis ([place]) 08/16/2021   Right foot pain 08/07/2021   Right lower lobe lung mass 08/06/2021   Hepatocellular carcinoma ([place]) 08/06/2021   Pressure injury of skin 07/05/2021   Right leg pain 07/04/2021   Chronic GI bleeding 07/04/2021   Acute on chronic diastolic CHF (congestive heart failure) ([place]) 06/14/2021   GERD without esophagitis 06/14/2021   Chest pain 06/13/2021   Diabetic foot infection ([place]) 04/14/2021   PAD (peripheral artery disease) ([place]) 04/08/2021   Gastritis and gastroduodenitis    ESRD (end stage renal disease) ([place]) 03/25/2021   Diabetes mellitus type 2, controlled, with complications ([place]) 24/40/1027   Alcohol abuse with intoxication ([place]) 03/25/2021   Cocaine abuse ([place]) 03/25/2021   Tobacco use disorder 03/25/2021   Class 2 obesity due to excess calories with body mass index (BMI) of 39.0 to 39.9 in adult 03/25/2021   AVM (arteriovenous malformation) of duodenum, acquired    Peptic ulcer disease    Upper GI bleed 03/24/2021   Chalazion of right upper eyelid 03/24/2021   Thrombocytopenia ([place]) 03/24/2021   Visual hallucination 03/24/2021   Bilateral leg edema 03/24/2021   Benign neoplasm of duodenum, jejunum, and ileum    Anemia due to chronic kidney disease 02/23/2021   Rotator cuff tear arthropathy of right shoulder 01/23/2021   Alcohol dependence ([place]) 01/13/2021   Gastric ulcer with hemorrhage 01/13/2021   Generalized weakness    Transaminitis 01/10/2021   Alcohol abuse 10/27/2020   History of cocaine abuse ([place]) 10/27/2020   Acute upper GI bleed 08/10/2020   Duodenal ulcer with hemorrhage    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   AVM (arteriovenous malformation) of small bowel, acquired    Anemia due to GI blood loss 07/31/2019   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) ([place]) 07/11/2019   Iron deficiency anemia 03/30/2019   Alcohol use disorder 03/30/2019   B12  deficiency 03/30/2019   Orthostatic hypotension 01/22/2019   Essential hypertension 06/29/2016   PCP:  [name], NP Pharmacy:   [facility], [address] [place] [address] Phone: [phone] Fax: [phone]  Readmission Risk Interventions    10/10/2021   10:49 AM 02/27/2021   12:51 PM  Readmission Risk Prevention Plan  Transportation Screening Complete Complete  Medication Review ([place]) Complete Complete  PCP or Specialist appointment within 3-5 days of discharge Complete Complete  HRI or Home Care Consult Complete Complete  SW Recovery Care/Counseling Consult Complete Complete  Palliative Care Screening Complete Not [place] Patient Refused Not Applicable

## 2022-04-15 NOTE — Progress Notes (Signed)
EEG complete - results pending 

## 2022-04-15 NOTE — Progress Notes (Signed)
Patient sedated and intubated. Informed consent signed and in chart.   Treatment initiated: 0430 Treatment completed: 0812  Patient hypotensive and unable to met UF goal.  No s/s of pain or distress upon completion of treatment  Hand-off given to patient's nurse.   Access used: right IJ HD catheter  Access issues: high ap; reduce bfr   Total UF removed: 2400 ml  Medication(s) given: heparin lock HD catheter  Post HD VS: 96.4, 75, 15, 105/65, 100% vent Post HD weight: 99.2 kg   [facility]

## 2022-04-15 NOTE — Progress Notes (Addendum)
Attending physician's note   I have taken a history, reviewed the chart, and examined the patient. I performed a substantive portion of this encounter, including complete performance of at least one of the key components, in conjunction with the APP. I agree with the APP's note, impression, and recommendations with my edits.   I discussed his case with the primary ICU team as well.  Has multiple potential etiologies for GI bleed, to include his known history of AVMs and portal hypertensive gastropathy.  Last endoscopy with grade 1 esophageal varices, but based on clinical description and appearance of material via OGT, does not appear to be variceal bleed.  Otherwise, he has not had any real durable relief with treatment of AVMs in the past (as is the case with small bowel AVM disease), and PHG is rarely effectively managed by endoscopy.  In light of his critical condition, we were in agreement of holding off on repeat upper endoscopy at this juncture.  - Continue octreotide - Continue Protonix - Depending on ability to recover from this acute on chronic insult, would benefit from octreotide LAR later on this admission - Continue serial CBC checks with additional blood products as needed per protocol - Per discussion with [facility], planning Palliative Care consult - GI service will remain available peripherally  Gerrit Heck, DO, FACG [phone] office                Daily Rounding Note  04/15/2022, 11:46 AM  LOS: 1 day   SUBJECTIVE:   Chief complaint:      Intubated early yesterday evening after large-volume dark bloody emesis.  Remains on vent.  No BM's.  Gastric fluid is now pale yellow, clear via NGT.     OBJECTIVE:         Vital signs in last 24 hours:    Temp:  [96.4 F (35.8 C)-97.9 F (36.6 C)] 97.9 F (36.6 C) (12/19 1010) Pulse Rate:  [68-117] 71 (12/19 1010) Resp:  [6-33] 15 (12/19 1100) BP:  (76-203)/(58-106) 100/58 (12/19 1100) SpO2:  [91 %-100 %] 100 % (12/19 1010) FiO2 (%):  [40 %-60 %] 40 % (12/19 1010) Weight:  [99.2 kg] 99.2 kg (12/19 0812) Last BM Date : 04/14/22 Filed Weights   04/15/22 0812  Weight: 99.2 kg   General: intubated.  Not re-examined.       Intake/Output from previous day: 12/18 0701 - 12/19 0700 In: 1403.4 [I.V.:588.4; Blood:715; IV Piggyback:100] Out: -   Intake/Output this shift: Total I/O In: -  Out: 2400 [Other:2400]  Lab Results: Recent Labs    04/14/22 1323 04/14/22 1837 04/14/22 1939 04/15/22 1052  WBC 5.8 6.5  --  5.3  HGB 5.7* 7.3* 7.1* 7.6*  HCT 17.4* 21.7* 21.0* 21.5*  PLT 123* 121*  --  103*   BMET Recent Labs    04/14/22 1323 04/14/22 1459 04/14/22 1939 04/15/22 0840  NA 130* 130* 131* 132*  K 5.1 5.3* 5.6* 3.3*  CL 93* 92*  --  96*  CO2 18* 18*  --  27  GLUCOSE 104* 104*  --  70  BUN 95* 98*  --  52*  CREATININE 9.83* 10.05*  --  5.53*  CALCIUM 8.5* 8.6*  --  7.8*   LFT Recent Labs    04/14/22 1459 04/15/22 0840  PROT 7.2 6.6  ALBUMIN 1.7* 1.6*  AST 103* 117*  ALT 28 33  ALKPHOS 75 66  BILITOT 1.5* 1.5*   PT/INR Recent Labs  04/14/22 1459 04/15/22 0840  LABPROT 16.4* 15.5*  INR 1.3* 1.2   Hepatitis Panel Recent Labs    04/14/22 1837  HEPBSAG NON REACTIVE    Studies/Results: EEG adult  Result Date: 04/15/2022 Lora Havens, MD     04/15/2022  9:38 AM Patient Name: KEYAAN LEDERMAN MRN: [id number] Epilepsy Attending: Lora Havens Referring Physician/Provider: Cristal Generous, NP Date: 04/14/2022 Duration: 24.10 mins Patient history: 61 year old male with altered mental status.  EEG Drolet for seizure. Level of alertness: comatose AEDs during EEG study: Propofol Technical aspects: This EEG study was done with scalp electrodes positioned according to the 10-20 International system of electrode placement. Electrical activity was reviewed with band pass filter of 1-'70Hz'$ , sensitivity  of 7 uV/mm, display speed of 48m/sec with a '60Hz'$  notched filter applied as appropriate. EEG data were recorded continuously and digitally stored.  Video monitoring was available and reviewed as appropriate. Description: EEG showed near continuous generalized 3 to 6 Hz theta-delta slowing.  Intermittent generalized 13 to 15 Hz beta activity was also noted.  Hyperventilation and photic stimulation were not performed.   ABNORMALITY - Continuous slow, generalized IMPRESSION: This study is suggestive of moderate to severe diffuse encephalopathy, nonspecific etiology. No seizures or epileptiform discharges were seen throughout the recording. Priyanka OBarbra Sarks  CT HEAD WO CONTRAST (5MM)  Result Date: 04/15/2022 CLINICAL DATA:  Anisocoria EXAM: CT HEAD WITHOUT CONTRAST TECHNIQUE: Contiguous axial images were obtained from the base of the skull through the vertex without intravenous contrast. RADIATION DOSE REDUCTION: This exam was performed according to the departmental dose-optimization program which includes automated exposure control, adjustment of the mA and/or kV according to patient size and/or use of iterative reconstruction technique. COMPARISON:  CT head 03/29/2022, MRI head 01/05/2022 FINDINGS: Brain: Cerebral ventricle sizes are concordant with the degree of cerebral volume loss. Patchy and confluent areas of decreased attenuation are noted throughout the deep and periventricular white matter of the cerebral hemispheres bilaterally, compatible with chronic microvascular ischemic disease. No evidence of large-territorial acute infarction. No parenchymal hemorrhage. No mass lesion. No extra-axial collection. No mass effect or midline shift. No hydrocephalus. Basilar cisterns are patent. Vascular: No hyperdense vessel. Atherosclerotic calcifications are present within the cavernous internal carotid and vertebral arteries. Skull: No acute fracture or focal lesion. Sinuses/Orbits: Paranasal sinuses and mastoid  air cells are clear. The orbits are unremarkable. Other: Endotracheal tube partially visualized. IMPRESSION: No acute intracranial abnormality. Electronically Signed   By: MIven FinnM.D.   On: 04/15/2022 02:17   DG Chest Portable 1 View  Result Date: 04/14/2022 CLINICAL DATA:  Intubated EXAM: PORTABLE CHEST 1 VIEW COMPARISON:  04/14/2022, 04/10/2022 FINDINGS: Interval intubation, tip of the endotracheal tube is about 2.5 cm superior to carina. Esophageal tube tip below the diaphragm but incompletely visualized. Right central venous catheter sheath over the distal jugular region. Two adjacent linear densities over the distal jugular vein/venous confluence distal to the sheath. No evidence for right pneumothorax. Cardiomegaly with pleural effusions and airspace disease as before. IMPRESSION: 1. Endotracheal tube tip about 2.5 cm superior to carina. 2. Cardiomegaly with pleural effusions and airspace disease as before. 3. Right central venous catheter sheath over the distal jugular region. Two adjacent linear densities over the distal jugular vein/venous confluence distal to the sheath. Uncertain if these are external to the patient or related to the jugular catheter, recommend repeat/follow-up chest radiograph to assess for persistence. Electronically Signed   By: KDonavan FoilM.D.   On: 04/14/2022 17:35  DG Abdomen 1 View  Result Date: 04/14/2022 CLINICAL DATA:  Orogastric tube placement. EXAM: ABDOMEN - 1 VIEW COMPARISON:  January 24, 2022. FINDINGS: Distal tip of nasogastric tube is seen in expected position of the body of the stomach. IMPRESSION: Distal tip of nasogastric tube is seen in expected position of the stomach. Electronically Signed   By: Marijo Conception M.D.   On: 04/14/2022 17:31   DG Chest Port 1 View  Result Date: 04/14/2022 CLINICAL DATA:  Cough, COVID-19 positive. EXAM: PORTABLE CHEST 1 VIEW COMPARISON:  April 10, 2022. FINDINGS: Stable cardiomegaly. Stable bilateral  upper lobe airspace opacities are noted, left greater than right, consistent with pneumonia. Stable bibasilar atelectasis or infiltrates are noted with associated pleural effusions. Bony thorax is unremarkable. IMPRESSION: Stable bilateral lung opacities and pleural effusions as described above. Electronically Signed   By: Marijo Conception M.D.   On: 04/14/2022 13:25    Scheduled Meds:  Chlorhexidine Gluconate Cloth  6 each Topical Q0600   docusate  100 mg Per Tube BID   mouth rinse  15 mL Mouth Rinse Q2H   pantoprazole  40 mg Intravenous Q12H   polyethylene glycol  17 g Per Tube Daily   Continuous Infusions:  anticoagulant sodium citrate     fentaNYL infusion INTRAVENOUS 50 mcg/hr (04/15/22 0400)   octreotide (SANDOSTATIN) 500 mcg in sodium chloride 0.9 % 250 mL (2 mcg/mL) infusion 50 mcg/hr (04/15/22 0400)   propofol (DIPRIVAN) infusion 40 mcg/kg/min (04/15/22 0924)   PRN Meds:.acetaminophen **OR** acetaminophen, alteplase, anticoagulant sodium citrate, docusate sodium, fentaNYL, heparin, lidocaine (PF), lidocaine-prilocaine, ondansetron **OR** ondansetron (ZOFRAN) IV, mouth rinse, pentafluoroprop-tetrafluoroeth, polyethylene glycol   ASSESMENT:     Recurrent gi bleeding.  Currently on octreotide, scheduled twice daily IV PPI.  At most recent EGD no source of bleeding and bleeding suspected to be from distal small bowel AVMs.  Prior bleed from gastric and duodenal AVMs, duodenal polyps.      Blood loss anemia.  Hgb 5.7.[name] 2 PRBCs.. 7.6    Covid +  Respiratory failure following vomiting, likely aspiration.  ESRD.    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    History HCCA.  Treated with Y90 ablation summer 2023.      AMS. ? Seizure.  EEG shows moderate to severe diffuse encephalopathy, nonspecific etiology. No seizures or epileptiform discharges.   CT head wo acute issues.     PLAN     No plans for repeat EGD.  Leave Protonix in place.  Octreotide x 72 hours.      Azucena Freed   04/15/2022, 11:46 AM Phone [phone]

## 2022-04-15 NOTE — Progress Notes (Signed)
RT transported pt to and from CT without event. 

## 2022-04-16 DIAGNOSIS — Z9911 Dependence on respirator [ventilator] status: Secondary | ICD-10-CM | POA: Diagnosis not present

## 2022-04-16 DIAGNOSIS — J1282 Pneumonia due to coronavirus disease 2019: Secondary | ICD-10-CM | POA: Diagnosis not present

## 2022-04-16 DIAGNOSIS — U071 COVID-19: Secondary | ICD-10-CM | POA: Diagnosis not present

## 2022-04-16 DIAGNOSIS — K922 Gastrointestinal hemorrhage, unspecified: Secondary | ICD-10-CM | POA: Diagnosis not present

## 2022-04-16 LAB — COMPREHENSIVE METABOLIC PANEL
ALT: 32 U/L (ref 0–44)
AST: 118 U/L — ABNORMAL HIGH (ref 15–41)
Albumin: 1.5 g/dL — ABNORMAL LOW (ref 3.5–5.0)
Alkaline Phosphatase: 65 U/L (ref 38–126)
Anion gap: 13 (ref 5–15)
BUN: 64 mg/dL — ABNORMAL HIGH (ref 8–23)
CO2: 24 mmol/L (ref 22–32)
Calcium: 7.9 mg/dL — ABNORMAL LOW (ref 8.9–10.3)
Chloride: 94 mmol/L — ABNORMAL LOW (ref 98–111)
Creatinine, Ser: 7.2 mg/dL — ABNORMAL HIGH (ref 0.61–1.24)
GFR, Estimated: 8 mL/min — ABNORMAL LOW (ref >60)
Glucose, Bld: 83 mg/dL (ref 70–99)
Potassium: 3.8 mmol/L (ref 3.5–5.1)
Sodium: 131 mmol/L — ABNORMAL LOW (ref 135–145)
Total Bilirubin: 1.2 mg/dL (ref 0.3–1.2)
Total Protein: 6.3 g/dL — ABNORMAL LOW (ref 6.5–8.1)

## 2022-04-16 LAB — PROTIME-INR
INR: 1.3 — ABNORMAL HIGH (ref 0.8–1.2)
Prothrombin Time: 16.2 seconds — ABNORMAL HIGH (ref 11.4–15.2)

## 2022-04-16 LAB — CBC
HCT: 23.4 % — ABNORMAL LOW (ref 39.0–52.0)
HCT: 24.6 % — ABNORMAL LOW (ref 39.0–52.0)
Hemoglobin: 7.9 g/dL — ABNORMAL LOW (ref 13.0–17.0)
Hemoglobin: 8.3 g/dL — ABNORMAL LOW (ref 13.0–17.0)
MCH: 32.4 pg (ref 26.0–34.0)
MCH: 32.3 pg (ref 26.0–34.0)
MCHC: 33.8 g/dL (ref 30.0–36.0)
MCHC: 33.7 g/dL (ref 30.0–36.0)
MCV: 95.9 fL (ref 80.0–100.0)
MCV: 95.7 fL (ref 80.0–100.0)
Platelets: 98 10*3/uL — ABNORMAL LOW (ref 150–400)
Platelets: 95 10*3/uL — ABNORMAL LOW (ref 150–400)
RBC: 2.44 MIL/uL — ABNORMAL LOW (ref 4.22–5.81)
RBC: 2.57 MIL/uL — ABNORMAL LOW (ref 4.22–5.81)
RDW: 18.7 % — ABNORMAL HIGH (ref 11.5–15.5)
RDW: 19.2 % — ABNORMAL HIGH (ref 11.5–15.5)
WBC: 4.5 10*3/uL (ref 4.0–10.5)
WBC: 4.4 10*3/uL (ref 4.0–10.5)
nRBC: 0 % (ref 0.0–0.2)
nRBC: 0 % (ref 0.0–0.2)

## 2022-04-16 LAB — PREPARE PLATELET PHERESIS: Unit division: 0

## 2022-04-16 LAB — BPAM PLATELET PHERESIS
Blood Product Expiration Date: 202312202359
ISSUE DATE / TIME: 202312190928
Unit Type and Rh: 6200

## 2022-04-16 LAB — GLUCOSE, CAPILLARY
Glucose-Capillary: 90 mg/dL (ref 70–99)
Glucose-Capillary: 81 mg/dL (ref 70–99)
Glucose-Capillary: 151 mg/dL — ABNORMAL HIGH (ref 70–99)
Glucose-Capillary: 120 mg/dL — ABNORMAL HIGH (ref 70–99)
Glucose-Capillary: 100 mg/dL — ABNORMAL HIGH (ref 70–99)

## 2022-04-16 LAB — PROCALCITONIN: Procalcitonin: 2.49 ng/mL

## 2022-04-16 LAB — AMMONIA: Ammonia: 47 umol/L — ABNORMAL HIGH (ref 9–35)

## 2022-04-16 LAB — HEPATITIS B SURFACE ANTIBODY, QUANTITATIVE: Hep B S AB Quant (Post): 42.8 m[IU]/mL (ref >9.9)

## 2022-04-16 MED ORDER — VANCOMYCIN HCL IN DEXTROSE 1-5 GM/200ML-% IV SOLN: 1000.0000 mg | INTRAVENOUS | Status: DC

## 2022-04-16 NOTE — Progress Notes (Signed)
Patient ID: [name], male   DOB: 1960-06-26, 61 y.o.   MRN: [id number]    Progress Note from the Palliative Medicine Team at [facility]   Patient Name: Daniel [name]        Date: 04/16/2022 DOB: [birth year]  Age: 61 y.o. MRN#: [id number] Attending Physician: Julian Hy, DO Primary Care Physician: [name], NP Admit Date: 04/14/2022   Medical records reviewed, discussed with treatment team   Attempted to call sister/ [name] and brother/ Deatra Ina unable to leave message.  Mailbox full.  Communicated this with nursing    No charge   Wadie Lessen NP  Palliative Medicine Team Team Phone # [phone] Pager [phone]

## 2022-04-16 NOTE — Progress Notes (Signed)
Patient ID: Daniel [name], male   DOB: January 25, 1961, 61 y.o.   MRN: [id number] S: Intubated and sedated, Hgb continues to drift down and had a blood transfusion yesterday. O:BP 134/76   Pulse 69   Temp (!) 97.2 F (36.2 C) (Temporal)   Resp 15   Wt 100.5 kg   SpO2 97%   BMI 30.90 kg/m   Intake/Output Summary (Last 24 hours) at 04/16/2022 0539 Last data filed at 04/16/2022 0900 Gross per 24 hour  Intake 2979.87 ml  Output --  Net 2979.87 ml   Intake/Output: I/O last 3 completed shifts: In: 3945.5 [I.V.:1671.2; Blood:1384; IV Piggyback:890.4] Out: 2400 [Other:2400]  Intake/Output this shift:  Total I/O In: 428.9 [I.V.:428.9] Out: -  Weight change:  JQB:[id number] and sedated   Recent Labs  Lab 04/10/22 0348 04/13/22 2033 04/14/22 1323 04/14/22 1459 04/14/22 1939 04/15/22 0840 04/15/22 1846 04/16/22 0341  NA 126* 128* 130* 130* 131* 132* 131* 131*  K 4.8 4.7 5.1 5.3* 5.6* 3.3* 3.7 3.8  CL 90* 92* 93* 92*  --  96* 93* 94*  CO2 23 21* 18* 18*  --  '27 26 24  '$ GLUCOSE 102* 112* 104* 104*  --  70 78 83  BUN 42* 60* 95* 98*  --  52* 61* 64*  CREATININE 8.62* 8.79* 9.83* 10.05*  --  5.53* 6.56* 7.20*  ALBUMIN 1.9*  --   --  1.7*  --  1.6* <1.5* 1.5*  CALCIUM 8.4* 8.4* 8.5* 8.6*  --  7.8* 7.8* 7.9*  PHOS  --   --   --   --   --   --  4.2  --   AST 125*  --   --  103*  --  117*  --  118*  ALT 41  --   --  28  --  33  --  32   Liver Function Tests: Recent Labs  Lab 04/14/22 1459 04/15/22 0840 04/15/22 1846 04/16/22 0341  AST 103* 117*  --  118*  ALT 28 33  --  32  ALKPHOS 75 66  --  65  BILITOT 1.5* 1.5*  --  1.2  PROT 7.2 6.6  --  6.3*  ALBUMIN 1.7* 1.6* <1.5* 1.5*   Recent Labs  Lab 04/14/22 1459  LIPASE 57*   Recent Labs  Lab 04/14/22 1837 04/16/22 0341  AMMONIA 45* 47*   CBC: Recent Labs  Lab 04/10/22 0348 04/13/22 2033 04/14/22 1323 04/14/22 1837 04/14/22 1939 04/15/22 1052 04/15/22 1701 04/16/22 0341  WBC 4.0   < > 5.8 6.5  --  5.3 4.8  4.5  NEUTROABS 2.3  --  4.3  --   --   --   --   --   HGB 8.4*   < > 5.7* 7.3*   < > 7.6* 6.7* 7.9*  HCT 24.3*   < > 17.4* 21.7*   < > 21.5* 19.9* 23.4*  MCV 98.4   < > 102.4* 98.2  --  93.5 96.6 95.9  PLT 131*   < > 123* 121*  --  103* 107* 98*   < > = values in this interval not displayed.   Cardiac Enzymes: No results for input(s): "CKTOTAL", "CKMB", "CKMBINDEX", "TROPONINI" in the last 168 hours. CBG: Recent Labs  Lab 04/15/22 1633 04/15/22 1950 04/15/22 2327 04/16/22 0338 04/16/22 0734  GLUCAP 85 75 71 90 81    Iron Studies: No results for input(s): "IRON", "TIBC", "TRANSFERRIN", "FERRITIN" in the last 72 hours.  Studies/Results: EEG adult  Result Date: 04/15/2022 Lora Havens, MD     04/15/2022  9:38 AM Patient Name: Daniel [name] MRN: [id number] Epilepsy Attending: Lora Havens Referring Physician/Provider: Cristal Generous, NP Date: 04/14/2022 Duration: 24.10 mins Patient history: 61 year old male with altered mental status.  EEG Drolet for seizure. Level of alertness: comatose AEDs during EEG study: Propofol Technical aspects: This EEG study was done with scalp electrodes positioned according to the 10-20 International system of electrode placement. Electrical activity was reviewed with band pass filter of 1-'70Hz'$ , sensitivity of 7 uV/mm, display speed of 72m/sec with a '60Hz'$  notched filter applied as appropriate. EEG data were recorded continuously and digitally stored.  Video monitoring was available and reviewed as appropriate. Description: EEG showed near continuous generalized 3 to 6 Hz theta-delta slowing.  Intermittent generalized 13 to 15 Hz beta activity was also noted.  Hyperventilation and photic stimulation were not performed.   ABNORMALITY - Continuous slow, generalized IMPRESSION: This study is suggestive of moderate to severe diffuse encephalopathy, nonspecific etiology. No seizures or epileptiform discharges were seen throughout the recording. Priyanka OBarbra Sarks  CT HEAD WO CONTRAST (5MM)  Result Date: 04/15/2022 CLINICAL DATA:  Anisocoria EXAM: CT HEAD WITHOUT CONTRAST TECHNIQUE: Contiguous axial images were obtained from the base of the skull through the vertex without intravenous contrast. RADIATION DOSE REDUCTION: This exam was performed according to the departmental dose-optimization program which includes automated exposure control, adjustment of the mA and/or kV according to patient size and/or use of iterative reconstruction technique. COMPARISON:  CT head 03/29/2022, MRI head 01/05/2022 FINDINGS: Brain: Cerebral ventricle sizes are concordant with the degree of cerebral volume loss. Patchy and confluent areas of decreased attenuation are noted throughout the deep and periventricular white matter of the cerebral hemispheres bilaterally, compatible with chronic microvascular ischemic disease. No evidence of large-territorial acute infarction. No parenchymal hemorrhage. No mass lesion. No extra-axial collection. No mass effect or midline shift. No hydrocephalus. Basilar cisterns are patent. Vascular: No hyperdense vessel. Atherosclerotic calcifications are present within the cavernous internal carotid and vertebral arteries. Skull: No acute fracture or focal lesion. Sinuses/Orbits: Paranasal sinuses and mastoid air cells are clear. The orbits are unremarkable. Other: Endotracheal tube partially visualized. IMPRESSION: No acute intracranial abnormality. Electronically Signed   By: MIven FinnM.D.   On: 04/15/2022 02:17   DG Chest Portable 1 View  Result Date: 04/14/2022 CLINICAL DATA:  Intubated EXAM: PORTABLE CHEST 1 VIEW COMPARISON:  04/14/2022, 04/10/2022 FINDINGS: Interval intubation, tip of the endotracheal tube is about 2.5 cm superior to carina. Esophageal tube tip below the diaphragm but incompletely visualized. Right central venous catheter sheath over the distal jugular region. Two adjacent linear densities over the distal jugular  vein/venous confluence distal to the sheath. No evidence for right pneumothorax. Cardiomegaly with pleural effusions and airspace disease as before. IMPRESSION: 1. Endotracheal tube tip about 2.5 cm superior to carina. 2. Cardiomegaly with pleural effusions and airspace disease as before. 3. Right central venous catheter sheath over the distal jugular region. Two adjacent linear densities over the distal jugular vein/venous confluence distal to the sheath. Uncertain if these are external to the patient or related to the jugular catheter, recommend repeat/follow-up chest radiograph to assess for persistence. Electronically Signed   By: KDonavan FoilM.D.   On: 04/14/2022 17:35   DG Abdomen 1 View  Result Date: 04/14/2022 CLINICAL DATA:  Orogastric tube placement. EXAM: ABDOMEN - 1 VIEW COMPARISON:  January 24, 2022. FINDINGS: Distal tip of  nasogastric tube is seen in expected position of the body of the stomach. IMPRESSION: Distal tip of nasogastric tube is seen in expected position of the stomach. Electronically Signed   By: Marijo Conception M.D.   On: 04/14/2022 17:31   DG Chest Port 1 View  Result Date: 04/14/2022 CLINICAL DATA:  Cough, COVID-19 positive. EXAM: PORTABLE CHEST 1 VIEW COMPARISON:  April 10, 2022. FINDINGS: Stable cardiomegaly. Stable bilateral upper lobe airspace opacities are noted, left greater than right, consistent with pneumonia. Stable bibasilar atelectasis or infiltrates are noted with associated pleural effusions. Bony thorax is unremarkable. IMPRESSION: Stable bilateral lung opacities and pleural effusions as described above. Electronically Signed   By: Marijo Conception M.D.   On: 04/14/2022 13:25    Chlorhexidine Gluconate Cloth  6 each Topical [id number]   folic acid  1 mg Per Tube Daily   multivitamin with minerals  1 tablet Per Tube Daily   mouth rinse  15 mL Mouth Rinse Q2H   pantoprazole  40 mg Intravenous Q12H   thiamine  100 mg Per Tube Daily   vancomycin variable  dose per unstable renal function (pharmacist dosing)   Does not apply See admin instructions    BMET    Component Value Date/Time   NA 131 (L) 04/16/2022 0341   NA 138 08/22/2020 1141   K 3.8 04/16/2022 0341   CL 94 (L) 04/16/2022 0341   CO2 24 04/16/2022 0341   GLUCOSE 83 04/16/2022 0341   BUN 64 (H) 04/16/2022 0341   BUN 20 08/22/2020 1141   CREATININE 7.20 (H) 04/16/2022 0341   CREATININE 6.60 (HH) 02/06/2022 0943   CALCIUM 7.9 (L) 04/16/2022 0341   CALCIUM 7.9 (L) 08/01/2019 0702   GFRNONAA 8 (L) 04/16/2022 0341   GFRNONAA 9 (L) 02/06/2022 0943   GFRAA 7 (L) 12/28/2019 1640   CBC    Component Value Date/Time   WBC 4.5 04/16/2022 0341   RBC 2.44 (L) 04/16/2022 0341   HGB 7.9 (L) 04/16/2022 0341   HGB 9.0 (L) 10/03/2021 0900   HGB 8.4 (L) 08/22/2020 1141   HCT 23.4 (L) 04/16/2022 0341   HCT 25.0 (L) 08/22/2020 1141   PLT 98 (L) 04/16/2022 0341   PLT 142 (L) 10/03/2021 0900   PLT 184 08/22/2020 1141   MCV 95.9 04/16/2022 0341   MCV 94 08/22/2020 1141   MCH 32.4 04/16/2022 0341   MCHC 33.8 04/16/2022 0341   RDW 18.7 (H) 04/16/2022 0341   RDW 16.7 (H) 08/22/2020 1141   LYMPHSABS 0.9 04/14/2022 1323   LYMPHSABS 1.8 08/22/2020 1141   MONOABS 0.6 04/14/2022 1323   EOSABS 0.0 04/14/2022 1323   EOSABS 0.3 08/22/2020 1141   BASOSABS 0.0 04/14/2022 1323   BASOSABS 0.0 08/22/2020 1141    Dialysis Orders:  MWF [facility] 4hr, 400/A1.5, EDW 98.8kg, 2K/2Ca, AVF, no heparin - Hectoral 28mg IV q HD - Appears to get IV iron with oncology, no ESA d/t cancer   Assessment/Plan:  Acute hypoxic respiratory failure - following large volume emesis of blood and intubated in the ED, plan per PCCM ABLA - acute on chronic with evidence of GI bleed.  Known AVM's.  Gi consulted and plan for blood transfusion. On octreotide and protonix.  Continue to transfuse prn.  Covid-19 Positive - without hypoxia but does have N/V/D.  Quarantine for 5 days per protocol.  Treatment per primary svc  ESRD  -  off schedule, will plan for HD tomorrow and will get back on  schedule Sunday (holiday schedule for MWF patients)  Hypertension/volume  - stable  Anemia  - as above, for blood transfusion and follow.  Metabolic bone disease -  continue with meds  Nutrition - renal diet  Medical noncompliance - multiple admissions with ongoing issue  Alcohol abuse disorder - high risk for withdrawal, per primary  Hepatocellular carcinoma - s/p Y90 on 11/28/21.  Hyperkalemia - mild and due to noncompliance with HD and GIB.  Resolved with HD.  Disposition - poor overall prognosis with multiple admissions for the past several months.  Agree with palliative care consult to help set goals/limits of care.   Donetta Potts, MD [facility]

## 2022-04-16 NOTE — Progress Notes (Signed)
NAME:  Daniel [name], MRN:  [id number], DOB:  Oct 02, 1960, LOS: 2 ADMISSION DATE:  04/14/2022, CONSULTATION DATE:  04/14/2022 REFERRING MD:  Dr. Avon Gully, CHIEF COMPLAINT:  UGIB    History of Present Illness:   [name], past medical history of peripheral arterial disease, dialysis patient Monday, end-stage renal disease, type 2 diabetes hepatocellular carcinoma status post radiation. seen in pulm clinic before for a lung nodule which on bronchoscopic eval / path was not c/w malignancy .  He has had GI bleeding in the past with 2 duodenal ulcers a AVM alcoholic cirrhosis with his known history hepatocellular carcinoma.  In the ER he was also found to be COVID-positive.  He ultimately had a large volume of bloody emesis in the emergency room he had respiratory failure and was intubated.  Pulmonary critical care was consulted for ICU was consulted for admission.  He had already been seen by hospitalist and admitted with GI consultation and IR but declined while he was in theEmergency department.   Pertinent Medical History   Past Medical History:  Diagnosis Date   Anemia    Diabetes mellitus without complication ([place])    patient denies   Dialysis patient [facility])    End stage chronic kidney disease ([place])    Hypertension    ICH (intracerebral hemorrhage) ([place]) 05/20/2017   PAD (peripheral artery disease) (HCC)    Shoulder pain, left 06/28/2013     Significant Hospital Events: Including procedures, antibiotic start and stop dates in addition to other pertinent events   12/17 covid + with generalized body sx, left ama 12/18 admitted with GIB. Intubated for hematemesis 12/19 starting on covid therapies   Interim History / Subjective:  Presentation ARDS sedated.  No  Objective   Blood pressure 116/65, pulse 71, temperature 97.7 F (36.5 C), temperature source Axillary, resp. rate 15, weight 100.5 kg, SpO2 97 %. CVP:  [7 mmHg-13 mmHg] 7 mmHg  Vent Mode:  PRVC FiO2 (%):  [40 %] 40 % Set Rate:  [15 bmp] 15 bmp Vt Set:  [600 mL] 600 mL PEEP:  [5 cmH20] 5 cmH20 Plateau Pressure:  [21 cmH20-27 cmH20] 27 cmH20   Intake/Output Summary (Last 24 hours) at 04/16/2022 1434 Last data filed at 04/16/2022 1300 Gross per 24 hour  Intake 2877.91 ml  Output --  Net 2877.91 ml    Filed Weights   04/15/22 0812 04/16/22 0500  Weight: 99.2 kg 100.5 kg    Examination: General: Critically ill-appearing man lying in bed no acute distress, intubated, sedated HENT: [place]/AT, eyes anicteric Lungs: Clear rales bilaterally, no wheezing or rhonchi.  Low volumes on CPAP pressure support Cardiovascular: S1 S 2, regular rate and rhythm Abdomen: Soft, nontender, nondistended Extremities: Fistula with thrill in left upper extremity, left BKA.  Minimal edema. 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  Transfuse for hemoglobin less than 7 or hemodynamically significant bleeding - Monitor and correct coagulopathy - Appreciate GI-no plans to scope at this point   Acute respiratory failure with hypoxia on mechanical ventilation  Covid infection with pneumonia -Volume management with dialysis - LTV May - VAP prevention protocol - PAD protocol for sedation - Daily SAT and SBT.  Low lung volumes on SBT today.  Remains too sedated.  As needed sedation overnight, limit is much as able for hopeful successful SBT tomorrow. - Empiric remdesivir - Empiric ceftriaxone for possible pneumonia with elevated  BNP. - Avoiding steroids for GI bleed. -Avoid steroids with GI bleed -Follow trach culture  ESRD -Intermittent hemodialysis per nephrology with temporary catheter in place -Renally dose medications - Strict I's/O   Hyponatremia -Correct with dialysis - Avoid hypotonic fluids  Hypoglycemia, resolved - D10 infusion 10 cc/h + thiamine   Thrombocytopenia-chronic due to cirrhosis Anemia, acute on chronic.  Baseline hemoglobin 7.5-8. - Monitor for bleeding - Transfuse for hemoglobin less than 7 or hemodynamically significant bleeding  Alcohol abuse - Vitamins - Monitor for withdrawal after extubation  Overall guarded prognosis.  Appreciate palliative care's input.     Best Practice (right click and "Reselect all SmartList Selections" daily)   Diet/type: NPO DVT prophylaxis: SCD GI prophylaxis: PPI Lines: Central line Foley:  Yes, and it is still needed Code Status:  full code Last date of multidisciplinary goals of care discussion [we will reach out to his family]  Labs   CBC: Recent Labs  Lab 04/10/22 0348 04/13/22 2033 04/14/22 1323 04/14/22 1837 04/14/22 1939 04/15/22 1052 04/15/22 1701 04/16/22 0341 04/16/22 1007  WBC 4.0   < > 5.8 6.5  --  5.3 4.8 4.5 4.4  NEUTROABS 2.3  --  4.3  --   --   --   --   --   --   HGB 8.4*   < > 5.7* 7.3* 7.1* 7.6* 6.7* 7.9* 8.3*  HCT 24.3*   < > 17.4* 21.7* 21.0* 21.5* 19.9* 23.4* 24.6*  MCV 98.4   < > 102.4* 98.2  --  93.5 96.6 95.9 95.7  PLT 131*   < > 123* 121*  --  103* 107* 98* 95*   < > = values in this interval not displayed.     Basic Metabolic Panel: Recent Labs  Lab 04/14/22 1323 04/14/22 1459 04/14/22 1939 04/15/22 0840 04/15/22 1846 04/15/22 2022 04/16/22 0341  NA 130* 130* 131* 132* 131*  --  131*  K 5.1 5.3* 5.6* 3.3* 3.7  --  3.8  CL 93* 92*  --  96* 93*  --  94*  CO2 18* 18*  --  27 26  --  24  GLUCOSE 104* 104*  --  70 78  --  83  BUN 95* 98*  --  52* 61*  --  64*  CREATININE 9.83* 10.05*  --   5.53* 6.56*  --  7.20*  CALCIUM 8.5* 8.6*  --  7.8* 7.8*  --  7.9*  MG  --   --   --   --   --  1.6*  --   PHOS  --   --   --   --  4.2  --   --     GFR: Estimated Creatinine Clearance: 12.8 mL/min (A) (by C-G formula based on SCr of 7.2 mg/dL (H)). Recent Labs  Lab 04/14/22 1459 04/14/22 1837 04/15/22 0840 04/15/22 1052 04/15/22 1701 04/16/22 0341 04/16/22 1007  PROCALCITON  --   --   --   --  2.07 2.49  --   WBC  --  6.5  --  5.3 4.8 4.5 4.4  LATICACIDVEN 5.9* 4.0*  1.7  --   --   --   --      Liver Function Tests: Recent Labs  Lab 04/10/22 0348 04/14/22 1459 04/15/22 0840 04/15/22 1846 04/16/22 0341  AST 125* 103* 117*  --  118*  ALT 41 28 33  --  32  ALKPHOS 101 75 66  --  65  BILITOT 1.0 1.5* 1.5*  --  1.2  PROT 8.9* 7.2 6.6  --  6.3*  ALBUMIN 1.9* 1.7* 1.6* <1.5* 1.5*    Recent Labs  Lab 04/14/22 1459  LIPASE 57*    Recent Labs  Lab 04/14/22 1837 04/16/22 0341  AMMONIA 45* 47*      This patient is critically ill with multiple organ system failure which requires frequent high complexity decision making, assessment, support, evaluation, and titration of therapies. This was completed through the application of advanced monitoring technologies and extensive interpretation of multiple databases. During this encounter critical care time was devoted to patient care services described in this note for 38 minutes.  Julian Hy, DO 04/16/22 8:05 PM [facility]  For contact information, see Amion. If no response to pager, please call PCCM consult pager. After hours, 7PM- 7AM, please call Elink.

## 2022-04-16 NOTE — Progress Notes (Incomplete)
Pharmacy Antibiotic Note  Daniel [name] is a 61 y.o. male with pneumonia.  Pharmacy has been consulted for vancomycin dosing (he is also on rocephin). He is noted with ESRD on HD MWF (last HD was 12/19) -WBC= 4.8 afebrile -cultures pending  Plan: -Vancomycin '1000mg'$  IV after HD on 12/21 -Will follow HD plans   Weight: 100.5 kg (221 lb 9 oz)  Temp (24hrs), Avg:98 F (36.7 C), Min:97 F (36.1 C), Max:98.8 F (37.1 C)  Recent Labs  Lab 04/14/22 1323 04/14/22 1459 04/14/22 1837 04/15/22 0840 04/15/22 1052 04/15/22 1701 04/15/22 1846 04/16/22 0341 04/16/22 1007  WBC 5.8  --  6.5  --  5.3 4.8  --  4.5 4.4  CREATININE 9.83* 10.05*  --  5.53*  --   --  6.56* 7.20*  --   LATICACIDVEN  --  5.9* 4.0* 1.7  --   --   --   --   --      Estimated Creatinine Clearance: 12.8 mL/min (A) (by C-G formula based on SCr of 7.2 mg/dL (H)).    Allergies  Allergen Reactions   Seroquel [Quetiapine] Other (See Comments)    Tardive kinesia Dystonia    Dilaudid [Hydromorphone Hcl] Itching and Other (See Comments)    Pt reports itchiness after IM injection     Antimicrobials this admission: 12/19 ceftriaxone 12/19 vancomycin  Dose adjustments this admission:   Microbiology results: 12/19 MRSA PCR neg 12/19 resp cx  Thank you for allowing pharmacy to be a part of this patient's care.  Hildred Laser, PharmD Clinical Pharmacist **Pharmacist phone directory can now be found on [email or web address] ([email or web address]).  Listed under [place].

## 2022-04-16 NOTE — Progress Notes (Signed)
Pharmacy Antibiotic Note  Daniel [name] is a 61 y.o. male with pneumonia.  Pharmacy has been consulted for vancomycin dosing (he is also on rocephin). He is noted with ESRD on HD MWF (last HD was 12/19). Plans are for HD on 12/21 -WBC= 5.3, afebrile -cultures ordered  Plan: -Vancomycin '2000mg'$  IV x1 then '1000mg'$  IV after HD -Will order the vancomycin maintenance dose one HD plans known   Weight: 100.5 kg (221 lb 9 oz)  Temp (24hrs), Avg:98 F (36.7 C), Min:97 F (36.1 C), Max:98.8 F (37.1 C)  Recent Labs  Lab 04/14/22 1323 04/14/22 1459 04/14/22 1837 04/15/22 0840 04/15/22 1052 04/15/22 1701 04/15/22 1846 04/16/22 0341 04/16/22 1007  WBC 5.8  --  6.5  --  5.3 4.8  --  4.5 4.4  CREATININE 9.83* 10.05*  --  5.53*  --   --  6.56* 7.20*  --   LATICACIDVEN  --  5.9* 4.0* 1.7  --   --   --   --   --      Estimated Creatinine Clearance: 12.8 mL/min (A) (by C-G formula based on SCr of 7.2 mg/dL (H)).    Allergies  Allergen Reactions   Seroquel [Quetiapine] Other (See Comments)    Tardive kinesia Dystonia    Dilaudid [Hydromorphone Hcl] Itching and Other (See Comments)    Pt reports itchiness after IM injection     Antimicrobials this admission: 12/19 ceftriaxone 12/19 vancomycin  Dose adjustments this admission:   Microbiology results: 12/19 MRSA PCR- neg 12/19 resp cx- pending  Thank you for allowing pharmacy to be a part of this patient's care.  Hildred Laser, PharmD Clinical Pharmacist **Pharmacist phone directory can now be found on [email or web address] ([email or web address]).  Listed under [place].

## 2022-04-17 ENCOUNTER — Ambulatory Visit (INDEPENDENT_AMBULATORY_CARE_PROVIDER_SITE_OTHER): Payer: Medicare Other | Admitting: Podiatry

## 2022-04-17 DIAGNOSIS — U071 COVID-19: Secondary | ICD-10-CM | POA: Diagnosis not present

## 2022-04-17 DIAGNOSIS — Z515 Encounter for palliative care: Secondary | ICD-10-CM

## 2022-04-17 DIAGNOSIS — K921 Melena: Secondary | ICD-10-CM | POA: Diagnosis not present

## 2022-04-17 DIAGNOSIS — J9601 Acute respiratory failure with hypoxia: Secondary | ICD-10-CM | POA: Diagnosis not present

## 2022-04-17 DIAGNOSIS — F10931 Alcohol use, unspecified with withdrawal delirium: Secondary | ICD-10-CM

## 2022-04-17 DIAGNOSIS — N186 End stage renal disease: Secondary | ICD-10-CM | POA: Diagnosis not present

## 2022-04-17 DIAGNOSIS — J9611 Chronic respiratory failure with hypoxia: Secondary | ICD-10-CM

## 2022-04-17 DIAGNOSIS — Z992 Dependence on renal dialysis: Secondary | ICD-10-CM | POA: Diagnosis not present

## 2022-04-17 DIAGNOSIS — J9620 Acute and chronic respiratory failure, unspecified whether with hypoxia or hypercapnia: Secondary | ICD-10-CM | POA: Insufficient documentation

## 2022-04-17 DIAGNOSIS — Z91199 Patient's noncompliance with other medical treatment and regimen due to unspecified reason: Secondary | ICD-10-CM

## 2022-04-17 LAB — CBC
HCT: 24.1 % — ABNORMAL LOW (ref 39.0–52.0)
Hemoglobin: 8 g/dL — ABNORMAL LOW (ref 13.0–17.0)
MCH: 32 pg (ref 26.0–34.0)
MCHC: 33.2 g/dL (ref 30.0–36.0)
MCV: 96.4 fL (ref 80.0–100.0)
Platelets: 90 10*3/uL — ABNORMAL LOW (ref 150–400)
RBC: 2.5 MIL/uL — ABNORMAL LOW (ref 4.22–5.81)
RDW: 18.8 % — ABNORMAL HIGH (ref 11.5–15.5)
WBC: 4.4 10*3/uL (ref 4.0–10.5)
nRBC: 0 % (ref 0.0–0.2)

## 2022-04-17 LAB — POCT I-STAT 7, (LYTES, BLD GAS, ICA,H+H)
Acid-base deficit: 2 mmol/L (ref 0.0–2.0)
Bicarbonate: 23.9 mmol/L (ref 20.0–28.0)
Calcium, Ion: 1.1 mmol/L — ABNORMAL LOW (ref 1.15–1.40)
HCT: 25 % — ABNORMAL LOW (ref 39.0–52.0)
Hemoglobin: 8.5 g/dL — ABNORMAL LOW (ref 13.0–17.0)
O2 Saturation: 98 %
Patient temperature: 98.6
Potassium: 4 mmol/L (ref 3.5–5.1)
Sodium: 131 mmol/L — ABNORMAL LOW (ref 135–145)
TCO2: 25 mmol/L (ref 22–32)
pCO2 arterial: 46.4 mmHg (ref 32–48)
pH, Arterial: 7.321 — ABNORMAL LOW (ref 7.35–7.45)
pO2, Arterial: 106 mmHg (ref 83–108)

## 2022-04-17 LAB — BLOOD GAS, VENOUS
Acid-base deficit: 1.7 mmol/L (ref 0.0–2.0)
Bicarbonate: 25.2 mmol/L (ref 20.0–28.0)
Drawn by: 5982
O2 Saturation: 87.6 %
Patient temperature: 37
pCO2, Ven: 50 mmHg (ref 44–60)
pH, Ven: 7.31 (ref 7.25–7.43)
pO2, Ven: 56 mmHg — ABNORMAL HIGH (ref 32–45)

## 2022-04-17 LAB — PROCALCITONIN: Procalcitonin: 2.07 ng/mL

## 2022-04-17 LAB — MAGNESIUM: Magnesium: 1.5 mg/dL — ABNORMAL LOW (ref 1.7–2.4)

## 2022-04-17 LAB — PROTIME-INR
INR: 1.2 (ref 0.8–1.2)
Prothrombin Time: 14.6 seconds (ref 11.4–15.2)

## 2022-04-17 LAB — COMPREHENSIVE METABOLIC PANEL
ALT: 26 U/L (ref 0–44)
AST: 90 U/L — ABNORMAL HIGH (ref 15–41)
Albumin: <1.5 g/dL — ABNORMAL LOW (ref 3.5–5.0)
Alkaline Phosphatase: 70 U/L (ref 38–126)
Anion gap: 12 (ref 5–15)
BUN: 68 mg/dL — ABNORMAL HIGH (ref 8–23)
CO2: 22 mmol/L (ref 22–32)
Calcium: 7.8 mg/dL — ABNORMAL LOW (ref 8.9–10.3)
Chloride: 96 mmol/L — ABNORMAL LOW (ref 98–111)
Creatinine, Ser: 8.27 mg/dL — ABNORMAL HIGH (ref 0.61–1.24)
GFR, Estimated: 7 mL/min — ABNORMAL LOW (ref >60)
Glucose, Bld: 99 mg/dL (ref 70–99)
Potassium: 3.7 mmol/L (ref 3.5–5.1)
Sodium: 130 mmol/L — ABNORMAL LOW (ref 135–145)
Total Bilirubin: 1.2 mg/dL (ref 0.3–1.2)
Total Protein: 6.4 g/dL — ABNORMAL LOW (ref 6.5–8.1)

## 2022-04-17 LAB — GLUCOSE, CAPILLARY
Glucose-Capillary: 103 mg/dL — ABNORMAL HIGH (ref 70–99)
Glucose-Capillary: 106 mg/dL — ABNORMAL HIGH (ref 70–99)
Glucose-Capillary: 107 mg/dL — ABNORMAL HIGH (ref 70–99)
Glucose-Capillary: 96 mg/dL (ref 70–99)
Glucose-Capillary: 99 mg/dL (ref 70–99)

## 2022-04-17 LAB — AMMONIA: Ammonia: 33 umol/L (ref 9–35)

## 2022-04-17 MED ORDER — ALBUTEROL SULFATE (2.5 MG/3ML) 0.083% IN NEBU
2.5000 mg | INHALATION_SOLUTION | RESPIRATORY_TRACT | Status: DC
  Administered 2022-04-17 – 2022-04-18 (×8): 2.5 mg via RESPIRATORY_TRACT
  Filled 2022-04-17 (×8): qty 3

## 2022-04-17 MED ORDER — ORAL CARE MOUTH RINSE: 15.0000 mL | OROMUCOSAL | Status: DC | PRN

## 2022-04-17 MED ORDER — MAGNESIUM SULFATE IN D5W 1-5 GM/100ML-% IV SOLN
1.0000 g | Freq: Once | INTRAVENOUS | Status: AC
  Administered 2022-04-17: 1 g via INTRAVENOUS
  Filled 2022-04-17: qty 100

## 2022-04-17 MED ORDER — ALBUMIN HUMAN 25 % IV SOLN
25.0000 g | Freq: Once | INTRAVENOUS | Status: AC
  Administered 2022-04-17: 25 g via INTRAVENOUS
  Filled 2022-04-17: qty 100

## 2022-04-17 MED ORDER — LORAZEPAM 2 MG/ML IJ SOLN
0.0000 mg | Freq: Three times a day (TID) | INTRAMUSCULAR | Status: AC
  Administered 2022-04-21: 2 mg via INTRAVENOUS
  Filled 2022-04-17: qty 1

## 2022-04-17 MED ORDER — LORAZEPAM 2 MG/ML IJ SOLN
0.0000 mg | INTRAMUSCULAR | Status: AC
  Administered 2022-04-17: 4 mg via INTRAVENOUS
  Administered 2022-04-17 – 2022-04-19 (×8): 2 mg via INTRAVENOUS
  Filled 2022-04-17 (×4): qty 1
  Filled 2022-04-17: qty 2
  Filled 2022-04-17 (×2): qty 1
  Filled 2022-04-17: qty 2
  Filled 2022-04-17: qty 1

## 2022-04-17 MED ORDER — DEXMEDETOMIDINE HCL IN NACL 400 MCG/100ML IV SOLN
0.0000 ug/kg/h | INTRAVENOUS | Status: DC
  Administered 2022-04-17: 0.5 ug/kg/h via INTRAVENOUS
  Filled 2022-04-17: qty 100

## 2022-04-17 MED ORDER — MAGNESIUM OXIDE -MG SUPPLEMENT 400 (240 MG) MG PO TABS: 400.0000 mg | ORAL_TABLET | Freq: Two times a day (BID) | ORAL | Status: DC

## 2022-04-17 MED ORDER — THIAMINE HCL 100 MG/ML IJ SOLN
100.0000 mg | Freq: Every day | INTRAMUSCULAR | Status: DC
  Administered 2022-04-18: 100 mg via INTRAVENOUS
  Filled 2022-04-17: qty 2

## 2022-04-17 NOTE — Progress Notes (Signed)
eLink Physician-Brief Progress Note Patient Name: Daniel [name] DOB: 1961/04/27 MRN: [id number]   Date of Service  04/17/2022  HPI/Events of Note  Received request to renew wrist restraints.   Pt is on heated high flow but per RN wakes up intermittently pulls at lines and attempts to crawl out of the bed.   eICU Interventions  Wrist restraints renewed.     Intervention Category Minor Interventions: Other:  Elsie Lincoln 04/17/2022, 8:48 PM

## 2022-04-17 NOTE — Progress Notes (Signed)
eLink Physician-Brief Progress Note Patient Name: Daniel [name] DOB: October 24, 1960 MRN: [id number]   Date of Service  04/17/2022  HPI/Events of Note  RN reporting pt self-extubated, now on 10L HFNC. Appears to be doing well, sat 100%.  No sedation since yesterday. Seen responsive but not following commands  eICU Interventions  ABG to be done to ensure patient not hypercapneic     Intervention Category Intermediate Interventions: Other:  Judd Lien 04/17/2022, 6:09 AM

## 2022-04-17 NOTE — Progress Notes (Signed)
Patient self extubated and placed on 10 LPM salter cannula. Will obtain an arterial blood gas and notify E-LINK.

## 2022-04-17 NOTE — Consult Note (Signed)
Consultation Note Date: 04/17/2022   Patient Name: Daniel [name]  DOB: December 25, 1960  MRN: [id number]  Age / Sex: 61 y.o., male  PCP: [name], NP Referring Physician: Julian Hy, DO  Reason for Consultation: Establishing goals of care and Psychosocial/spiritual support  HPI/Patient Profile: 61 y.o. male   admitted on 04/14/2022 with past medical history significant of end-stage renal disease on hemodialysis Tuesday Thursday Saturdays, diabetes mellitus type 2, ICH in 2019, PAD status post left BKA .chronic anemia, history of recurrent GI bleed with 2 duodenal ulcers/AVM, alcoholic cirrhosis with hepatocellular carcinoma right upper lung mass chronic back pain.    Patient admitted with symptomatic anemia, generally lethargic and tired, patient has also missed dialysis multiple times this past week due to transportation issues per report - last reported outpatient HD 04/07/22 a week prior to admission - received urgent HD here on 12/14 while in the ED.  Significant Hospital Events  12/17 covid + with generalized body sx, left ama 12/18 admitted with GIB. Intubated for hematemesis 12/19 starting on covid therapies  12/20 self extubated 12/21 encephalopathic    Multiple hospitalizations and ER visits, well known to the hospital system and the PMT.    Patient  without medical decision capacity, treatment team has been unable to communicate with contacts listed.  High risk for decompensation  Outstanding decisions regarding treatment option decisions, advanced directive decisions and anticipatory care needs.    Clinical Assessment and Goals of Care:  This NP Wadie Lessen reviewed medical records, received report from team, assessed the patient and then spoke to patient's brother/Ricky Maricela Bo by phone  to discuss diagnosis, prognosis, GOC, EOL wishes disposition and options.   Concept  of Palliative Care was introduced as specialized medical care for people and their families living with serious illness.  If focuses on providing relief from the symptoms and stress of a serious illness.  The goal is to improve quality of life for both the patient and the family.   Values and goals of care important to patient and family were attempted to be elicited.  Education offered regarding the seriousness of the current medical situation and the important decisions that need to be discussed and decided regarding next steps in his treatment plan.   Education offered to family on the difficult situation that the patient is in and the limited medical interventions available to him at this point in time to prolong quality of life.   Patient's brother speaks to family's many efforts to reach out and help their brother however all efforts are rejected, patient keeps his family at arms length and family have been concerned for patient and his decisions for many years.   A  discussion was had today regarding advanced directives.  Concepts specific to code status, artifical feeding and hydration, continued IV antibiotics and rehospitalization was had.    Education to his brother regarding the fact that  the patient does not have capacity to make his own decisions and healthcare team is seeking support from  family to help make medical decisions for this patient at this time.  Patient's brother tells me that he is willing to step into that responsibility, he will also talk to his Sister [name] to assist him in that responsibility.  Brother is unable to come to the hospital until this weekend.    He will call the palliative medicine team phone number with a time for a scheduled family meeting with family, attending team and palliative medicine hopefully sometime this weekend, we await direction from family for further conversation.    The difference between a aggressive medical intervention  path  and a palliative comfort care path for this patient at this time was had.    Education offered on hospice benefit;Philosophy and eligibility      Questions and concerns addressed.  Patient  encouraged to call with questions or concerns.     PMT will continue to support holistically.         No documented HPOA or ACP documents.   Brother/Ricky Maricela Bo agrees to met with treatment team and act as decision maker for his brother at this time.  He plans to contact his sister/[name] Evelene Croon to assist him in these decisions.        SUMMARY OF RECOMMENDATIONS    Code Status/Advance Care Planning: Full code  Palliative Prophylaxis:  Aspiration, Bowel Regimen, Delirium Protocol, Frequent Pain Assessment, and Oral Care  Additional Recommendations (Limitations, Scope, Preferences): Full Scope Treatment  Psycho-social/Spiritual:  Desire for further Chaplaincy support:no   Prognosis:  Unable to determine  Discharge Planning: To Be Determined      Primary Diagnoses: Present on Admission:  Symptomatic anemia  ABLA (acute blood loss anemia)  Acute on chronic anemia  Alcohol abuse  Cirrhosis of liver with ascites (HCC)  Diabetes mellitus type 2, controlled, with complications (HCC)  Thrombocytopenia (HCC)  Physical deconditioning  Peptic ulcer disease  Iron deficiency anemia due to chronic blood loss  GIB (gastrointestinal bleeding)   I have reviewed the medical record, interviewed the patient and family, and examined the patient. The following aspects are pertinent.  Past Medical History:  Diagnosis Date   Anemia    Diabetes mellitus without complication [facility])    patient denies   Dialysis patient [facility])    End stage chronic kidney disease ([place])    Hypertension    ICH (intracerebral hemorrhage) ([place]) 05/20/2017   PAD (peripheral artery disease) (HCC)    Shoulder pain, left 06/28/2013   Social History   Socioeconomic History   Marital status: Single     Spouse name: Not on file   Number of children: 2   Years of education: 12   Highest education level: 12th grade  Occupational History   Occupation: disabled  Tobacco Use   Smoking status: Every Day    Packs/day: 0.50    Years: 45.00    Total pack years: 22.50    Types: Cigarettes   Smokeless tobacco: Never   Tobacco comments:    Pt left before information given  Vaping Use   Vaping Use: Never used  Substance and Sexual Activity   Alcohol use: Not Currently   Drug use: Not Currently    Types: "Crack" cocaine    Comment: last in 2020   Sexual activity: Yes    Birth control/protection: None  Other Topics Concern   Not on file  Social History Narrative   Goes to dialysis M-W-F, [facility] using a wheelchair at present, waiting for prosthetic. Looking for handicapped housing  for immediate move in.   Social Determinants of Health   Financial Resource Strain: High Risk (11/01/2021)   Overall Financial Resource Strain (CARDIA)    Difficulty of Paying Living Expenses: Hard  Food Insecurity: No Food Insecurity (04/06/2022)   Hunger Vital Sign    Worried About Running Out of Food in the Last Year: Never true    Ran Out of Food in the Last Year: Never true  Transportation Needs: No Transportation Needs (04/06/2022)   PRAPARE - Hydrologist (Medical): No    Lack of Transportation (Non-Medical): No  Recent Concern: Transportation Needs - Unmet Transportation Needs (03/04/2022)   PRAPARE - Transportation    Lack of Transportation (Medical): Yes    Lack of Transportation (Non-Medical): Yes  Physical Activity: Insufficiently Active (11/01/2021)   Exercise Vital Sign    Days of Exercise per Week: 1 day    Minutes of Exercise per Session: 20 min  Stress: No Stress Concern Present (11/01/2021)   [place]    Feeling of Stress : Not at all  Social Connections: Socially Isolated (11/01/2021)   Social  Connection and Isolation Panel [NHANES]    Frequency of Communication with Friends and Family: More than three times a week    Frequency of Social Gatherings with Friends and Family: More than three times a week    Attends Religious Services: Never    Marine scientist or Organizations: No    Attends Music therapist: Never    Marital Status: Divorced   Family History  Problem Relation Age of Onset   Colon cancer Neg Hx    Scheduled Meds:  albuterol  2.5 mg Nebulization Q4H   Chlorhexidine Gluconate Cloth  6 each Topical A3094   folic acid  1 mg Per Tube Daily   multivitamin with minerals  1 tablet Per Tube Daily   mouth rinse  15 mL Mouth Rinse Q2H   pantoprazole  40 mg Intravenous Q12H   thiamine (VITAMIN B1) injection  100 mg Intravenous Daily   Continuous Infusions:  anticoagulant sodium citrate     cefTRIAXone (ROCEPHIN)  IV Stopped (04/17/22 1242)   dexmedetomidine (PRECEDEX) IV infusion Stopped (04/17/22 1250)   dextrose 10 mL/hr at 04/17/22 1500   PRN Meds:.acetaminophen **OR** acetaminophen, alteplase, anticoagulant sodium citrate, docusate sodium, heparin, lidocaine (PF), lidocaine-prilocaine, ondansetron **OR** ondansetron (ZOFRAN) IV, mouth rinse, pentafluoroprop-tetrafluoroeth, polyethylene glycol Medications Prior to Admission:  Prior to Admission medications   Medication Sig Start Date End Date Taking? Authorizing Provider  amLODipine (NORVASC) 5 MG tablet Take 1 tablet (5 mg total) by mouth daily. 04/01/22 06/30/22  Charlott Rakes, MD  atorvastatin (LIPITOR) 40 MG tablet Take 1 tablet (40 mg total) by mouth daily. 04/01/22 06/30/22  Charlott Rakes, MD  Calcium Carbonate Antacid (CALCIUM CARBONATE, DOSED IN MG ELEMENTAL CALCIUM,) 1250 MG/5ML SUSP Take 5 mLs (500 mg of elemental calcium total) by mouth every 6 (six) hours as needed for indigestion. 03/29/22   Charlynne Cousins, MD  diclofenac Sodium (VOLTAREN) 1 % GEL Apply 2 g topically every 6 (six)  hours as needed (apply on painful areas). 04/08/22   Shelly Coss, MD  gabapentin (NEURONTIN) 300 MG capsule Take 300 mg by mouth 3 (three) times daily.    [provider]  loperamide (IMODIUM) 2 MG capsule Take 1 capsule (2 mg total) by mouth 4 (four) times daily as needed for diarrhea or loose stools. 01/31/22  Carmin Muskrat, MD  nicotine (NICODERM CQ - DOSED IN MG/24 HOURS) 21 mg/24hr patch Place 1 patch (21 mg total) onto the skin daily. 04/09/22   Shelly Coss, MD  pantoprazole (PROTONIX) 40 MG tablet Take 40 mg by mouth 2 (two) times daily before a meal.    [provider]  traZODone (DESYREL) 50 MG tablet Take 1 tablet (50 mg total) by mouth daily as needed for sleep. 02/10/22   [name], NP  colchicine 0.6 MG tablet Take 0.5 tablets (0.3 mg total) by mouth 2 (two) times daily. 07/24/20 07/29/20  Noemi Chapel, MD  furosemide (LASIX) 40 MG tablet Take 1 tablet (40 mg total) by mouth daily. 07/25/19 02/09/20  Ladona Horns, MD   Allergies  Allergen Reactions   Seroquel [Quetiapine] Other (See Comments)    Tardive kinesia Dystonia    Dilaudid [Hydromorphone Hcl] Itching and Other (See Comments)    Pt reports itchiness after IM injection    Review of Systems  Unable to perform ROS   Physical Exam Constitutional:      Appearance: He is underweight. He is ill-appearing.     Interventions: Nasal cannula in place.  Cardiovascular:     Rate and Rhythm: Normal rate.  Neurological:     Comments: Minimally responsive     Vital Signs: BP 116/69   Pulse 71   Temp 98.2 F (36.8 C) (Axillary)   Resp 14   Wt 100.3 kg   SpO2 99%   BMI 30.84 kg/m  Pain Scale: 0-10   Pain Score: 0-No pain   SpO2: SpO2: 99 % O2 Device:SpO2: 99 % O2 Flow Rate: .O2 Flow Rate (L/min): (S) 25 L/min  IO: Intake/output summary:  Intake/Output Summary (Last 24 hours) at 04/17/2022 1546 Last data filed at 04/17/2022 1500 Gross per 24 hour  Intake 1197.97 ml  Output  2500 ml  Net -1302.03 ml    LBM: Last BM Date : 04/16/22 Baseline Weight: Weight: 99.2 kg Most recent weight: Weight: 100.3 kg     Palliative Assessment/Data:   Discussed with Dr Carlis Abbott  Time In: 1400 Time Out: 1515 Time Total: 75 minutes   Greater than 50%  of this time was spent counseling and coordinating care related to the above assessment and plan.  Signed by: Wadie Lessen, NP   Please contact Palliative Medicine Team phone at [phone] for questions and concerns.  For individual provider: See [name]

## 2022-04-17 NOTE — Progress Notes (Signed)
Pt was no show for apt / apt rescheduled by [place]. No charge

## 2022-04-17 NOTE — Procedures (Signed)
I was present at this dialysis session. I have reviewed the session itself and made appropriate changes.   Vital signs in last 24 hours:  Temp:  [97.7 F (36.5 C)-99.5 F (37.5 C)] 98.1 F (36.7 C) (12/21 0850) Pulse Rate:  [69-89] 86 (12/21 1031) Resp:  [9-23] 9 (12/21 1031) BP: (109-155)/(61-94) 125/87 (12/21 1031) SpO2:  [92 %-100 %] 94 % (12/21 1031) FiO2 (%):  [40 %-52 %] 52 % (12/21 0800) Weight:  [100.3 kg] 100.3 kg (12/21 0345) Weight change: 1.1 kg Filed Weights   04/15/22 0812 04/16/22 0500 04/17/22 0345  Weight: 99.2 kg 100.5 kg 100.3 kg    Recent Labs  Lab 04/15/22 1846 04/16/22 0341 04/17/22 0405 04/17/22 0615  NA 131*   < > 130* 131*  K 3.7   < > 3.7 4.0  CL 93*   < > 96*  --   CO2 26   < > 22  --   GLUCOSE 78   < > 99  --   BUN 61*   < > 68*  --   CREATININE 6.56*   < > 8.27*  --   CALCIUM 7.8*   < > 7.8*  --   PHOS 4.2  --   --   --    < > = values in this interval not displayed.    Recent Labs  Lab 04/14/22 1323 04/14/22 1837 04/16/22 0341 04/16/22 1007 04/17/22 0405 04/17/22 0615  WBC 5.8   < > 4.5 4.4 4.4  --   NEUTROABS 4.3  --   --   --   --   --   HGB 5.7*   < > 7.9* 8.3* 8.0* 8.5*  HCT 17.4*   < > 23.4* 24.6* 24.1* 25.0*  MCV 102.4*   < > 95.9 95.7 96.4  --   PLT 123*   < > 98* 95* 90*  --    < > = values in this interval not displayed.    Scheduled Meds:  albuterol  2.5 mg Nebulization Q4H   Chlorhexidine Gluconate Cloth  6 each Topical [id number]   folic acid  1 mg Per Tube Daily   multivitamin with minerals  1 tablet Per Tube Daily   mouth rinse  15 mL Mouth Rinse Q2H   pantoprazole  40 mg Intravenous Q12H   thiamine (VITAMIN B1) injection  100 mg Intravenous Daily   vancomycin variable dose per unstable renal function (pharmacist dosing)   Does not apply See admin instructions   Continuous Infusions:  anticoagulant sodium citrate     cefTRIAXone (ROCEPHIN)  IV Stopped (04/16/22 1519)   dextrose 10 mL/hr at 04/17/22 0900    octreotide (SANDOSTATIN) 500 mcg in sodium chloride 0.9 % 250 mL (2 mcg/mL) infusion 50 mcg/hr (04/17/22 0900)   remdesivir 100 mg in sodium chloride 0.9 % 100 mL IVPB Stopped (04/16/22 1004)   PRN Meds:.acetaminophen **OR** acetaminophen, alteplase, anticoagulant sodium citrate, docusate sodium, heparin, lidocaine (PF), lidocaine-prilocaine, ondansetron **OR** ondansetron (ZOFRAN) IV, mouth rinse, pentafluoroprop-tetrafluoroeth, polyethylene glycol   Donetta Potts,  MD 04/17/2022, 10:37 AM

## 2022-04-17 NOTE — Progress Notes (Signed)
On 04/17/22 at 0541, patient self extubated while in restraints. Nurses, Terisa Starr and Geoffery Lyons were in the room next door when they heard the alarm from the ventilator. Clyde Canterbury Naleigha Raimondi went to check on the alarm at [phone] and noticed patient had self extubated. She called for Tim (RT), who was one pod over, while she entered the room and also calling Caryl Pina for assistance. Patient placed on high flow nasal canula at 10L. Oxygen saturation 98% and Elink physian notified. ABG obtained at 0600 and results indicated no reintubation was necessary. Patient resting peacefully in bed.   Terisa Starr, RN 04/17/22 [phone]

## 2022-04-17 NOTE — Progress Notes (Signed)
eLink Physician-Brief Progress Note Patient Name: Daniel [name] DOB: 1960-11-24 MRN: [id number]   Date of Service  04/17/2022  HPI/Events of Note  Notified that patient did not get Mg oxide as the patient is NPO.  Mg level was 1.5.   eICU Interventions  Give MgSO4 1g IV now.  Recheck Mg in the morning.      Intervention Category Intermediate Interventions: Electrolyte abnormality - evaluation and management  Elsie Lincoln 04/17/2022, 11:27 PM

## 2022-04-17 NOTE — Progress Notes (Addendum)
NAME:  Daniel [name], MRN:  [id number], DOB:  [birth year], LOS: 3 ADMISSION DATE:  04/14/2022, CONSULTATION DATE:  04/14/2022 REFERRING MD:  Dr. Avon Gully, CHIEF COMPLAINT:  UGIB    History of Present Illness:   [name], past medical history of peripheral arterial disease, dialysis patient Monday, end-stage renal disease, type 2 diabetes hepatocellular carcinoma status post radiation. seen in pulm clinic before for a lung nodule which on bronchoscopic eval / path was not c/w malignancy .  He has had GI bleeding in the past with 2 duodenal ulcers a AVM alcoholic cirrhosis with his known history hepatocellular carcinoma.  In the ER he was also found to be COVID-positive.  He ultimately had a large volume of bloody emesis in the emergency room he had respiratory failure and was intubated.  Pulmonary critical care was consulted for ICU was consulted for admission.  He had already been seen by hospitalist and admitted with GI consultation and IR but declined while he was in theEmergency department.   Pertinent Medical History   Past Medical History:  Diagnosis Date   Anemia    Diabetes mellitus without complication ([place])    patient denies   Dialysis patient [facility])    End stage chronic kidney disease ([place])    Hypertension    ICH (intracerebral hemorrhage) ([place]) 05/20/2017   PAD (peripheral artery disease) ([facility])    Shoulder pain, left 06/28/2013     Significant Hospital Events: Including procedures, antibiotic start and stop dates in addition to other pertinent events   12/17 covid + with generalized body sx, left ama 12/18 admitted with GIB. Intubated for hematemesis 12/19 starting on covid therapies   Interim History / Subjective:  Self extubated in restraints this morning.  Receiving prn fentanyl overnight.  30 mins post ABG 7.321/46/ 106 on [place] HFNC 10L.  Awake but not following commands.   iHD at bedside  Tmax 99.5  Objective   Blood pressure (!)  155/83, pulse 85, temperature 98.5 F (36.9 C), temperature source Oral, resp. rate 19, weight 100.3 kg, SpO2 100 %. CVP:  [0 mmHg-84 mmHg] 2 mmHg  Vent Mode: PRVC FiO2 (%):  [40 %] 40 % Set Rate:  [15 bmp] 15 bmp Vt Set:  [600 mL] 600 mL PEEP:  [5 cmH20] 5 cmH20 Pressure Support:  [5 cmH20] 5 cmH20 Plateau Pressure:  [21 HGD92-42 cmH20] 23 cmH20   Intake/Output Summary (Last 24 hours) at 04/17/2022 0727 Last data filed at 04/17/2022 0056 Gross per 24 hour  Intake 1208.07 ml  Output --  Net 1208.07 ml   Filed Weights   04/15/22 0812 04/16/22 0500 04/17/22 0345  Weight: 99.2 kg 100.5 kg 100.3 kg    Examination: General:  AoC ill appearing male sitting upright in bed HEENT:  pupils L 4/r, R 3/r, unable to perform mouth care/assess, mild expiratory stridor Neuro:  Awake, not tracking but occasionally looks, some non-comprehensible verbal, MAE, in bilateral wrist restraints CV: rr, NSR, no murmur, R IJ trialysis  PULM:  some mildly labored abdominal breathing, lungs coarse, diffuse exp wheeze, strong cough - productive but will not let suction, not spitting GI: soft, bs+, NT Extremities: warm/dry, L BKA, +1 RLE edema, LUE AVF +b/t Skin: no rashes   12/19 trach asp> pending +1.2L/ 24hrs Net +2.7L Labs> Na 130, K 3.7, BUN/ sCr 68/ 8.27, iCa 1.1, AST 118> 90, WBC 4.4, H/H 8/ 24, plts 95> 90, INR 1.2, PCT 2.49> 2.07, ammonia 47  Resolved Hospital Problem  list   Lactic acidosis, improved   Assessment & Plan:   Acute blood loss anemia due to upper GI bleed--stable, no signs of ongoing bleeding Cirrhosis w/ HCC 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, Grade 1 esophageal varices, cirrhosis - appreciate [place] [facility] input, no plans to scope, following peripherally - cont octreotide per GI recs for 5 days then BID, then consider octreotide LAR prior to discharge - PPI BID - cont ceftriaxone - H/H remains stable, continue to trend - monitor coagulapathy, stable today  -  appreciate PMT assistance    Acute respiratory failure with hypoxia on mechanical ventilation Covid infection with pneumonia - self extubated this morning - remains high risk for reintubation, need to closely monitor WOB, mental status, and secretion clearance   - repeat VBG now, oxygenation not an issue at this time, concern for hypercarbia - albuterol now for wheeze/ stridor - aspiration precautions, remains NPO - switch to heated HFNC to give some support, BiPAP contraindicated given his mental status and prior vomiting would be high risk - iHD this am, hopefully will help - cont empiric remdesivir, holding on steroids given GIB - cont ceftriaxone - follow trach asp - intermittent CXR   ESRD on iHD, poorly compliant  Hyponatremia Hypokalemia  - Appreciate Nephrology assistance - scheduled for iHD today then again on Sun> plans to use his trialysis catheter today given his restlessness, high risk to access AVF per HD RN.   Consider removing trialysis later today, pending trajectory today - electrolyte management per Nephrology - strict I/O   Hypoglycemia, resolved - remains on D10 gtt at 4m/hr with thiamine - ok to place cortrak per GI - start TF per RD recs, high risk for refeeding given hx  Thrombocytopenia-chronic due to cirrhosis Anemia, acute on chronic.  Baseline hemoglobin 7.5-8. - no s/s bleeding.  H/H stable  - Transfuse for hemoglobin less than 7 or hemodynamically significant bleeding  Alcohol abuse - cont MVI/ folate/ thiamine - unclear ETOH hx, monitor for withdrawals - considering CIWA w/ ativan ideally, but need to monitor closely given respiratory status pending VBG  Encephalopathy, metabolic/ toxic, concern for early withdrawal - supportive care as above - iHD today - monitor for ETOH withdrawal  Overall guarded prognosis.  Appreciate palliative care's input.  Best Practice (right click and "Reselect all SmartList Selections" daily)   Diet/type:  NPO DVT prophylaxis: SCD GI prophylaxis: PPI BID Lines: Central line Foley:  N/A Code Status:  full code Last date of multidisciplinary goals of care discussion [we will reach out to his family]  Attempted to call sister VVerdene Lennert rang, mailbox full, then brother, RLouie Casa straight to VM which was full   Labs   CBC: Recent Labs  Lab 04/14/22 1323 04/14/22 1837 04/15/22 1052 04/15/22 1701 04/16/22 0341 04/16/22 1007 04/17/22 0405 04/17/22 0615  WBC 5.8   < > 5.3 4.8 4.5 4.4 4.4  --   NEUTROABS 4.3  --   --   --   --   --   --   --   HGB 5.7*   < > 7.6* 6.7* 7.9* 8.3* 8.0* 8.5*  HCT 17.4*   < > 21.5* 19.9* 23.4* 24.6* 24.1* 25.0*  MCV 102.4*   < > 93.5 96.6 95.9 95.7 96.4  --   PLT 123*   < > 103* 107* 98* 95* 90*  --    < > = values in this interval not displayed.    Basic Metabolic Panel: Recent Labs  Lab 04/14/22  1459 04/14/22 1939 04/15/22 0840 04/15/22 1846 04/15/22 2022 04/16/22 0341 04/17/22 0405 04/17/22 0615  NA 130*   < > 132* 131*  --  131* 130* 131*  K 5.3*   < > 3.3* 3.7  --  3.8 3.7 4.0  CL 92*  --  96* 93*  --  94* 96*  --   CO2 18*  --  27 26  --  24 22  --   GLUCOSE 104*  --  70 78  --  83 99  --   BUN 98*  --  52* 61*  --  64* 68*  --   CREATININE 10.05*  --  5.53* 6.56*  --  7.20* 8.27*  --   CALCIUM 8.6*  --  7.8* 7.8*  --  7.9* 7.8*  --   MG  --   --   --   --  1.6*  --   --   --   PHOS  --   --   --  4.2  --   --   --   --    < > = values in this interval not displayed.   GFR: Estimated Creatinine Clearance: 11.2 mL/min (A) (by C-G formula based on SCr of 8.27 mg/dL (H)). Recent Labs  Lab 04/14/22 1459 04/14/22 1837 04/15/22 0840 04/15/22 1052 04/15/22 1701 04/16/22 0341 04/16/22 1007 04/17/22 0405  PROCALCITON  --   --   --   --  2.07 2.49  --  2.07  WBC  --  6.5  --    < > 4.8 4.5 4.4 4.4  LATICACIDVEN 5.9* 4.0* 1.7  --   --   --   --   --    < > = values in this interval not displayed.    Liver Function Tests: Recent Labs   Lab 04/14/22 1459 04/15/22 0840 04/15/22 1846 04/16/22 0341 04/17/22 0405  AST 103* 117*  --  118* 90*  ALT 28 33  --  32 26  ALKPHOS 75 66  --  65 70  BILITOT 1.5* 1.5*  --  1.2 1.2  PROT 7.2 6.6  --  6.3* 6.4*  ALBUMIN 1.7* 1.6* <1.5* 1.5* <1.5*   Recent Labs  Lab 04/14/22 1459  LIPASE 57*   Recent Labs  Lab 04/14/22 1837 04/16/22 0341 04/17/22 0405  AMMONIA 45* 47* 33     This patient is critically ill with multiple organ system failure which requires frequent high complexity decision making, assessment, support, evaluation, and titration of therapies. This was completed through the application of advanced monitoring technologies and extensive interpretation of multiple databases. During this encounter critical care time was devoted to patient care services described in this note for 39 minutes.   Kennieth Rad, Edmonia Caprio [facility] 04/17/2022, 7:27 AM  See Amion for pager If no response to pager, please call PCCM consult pager After 7:00 pm call Elink

## 2022-04-17 NOTE — Procedures (Signed)
HD Note:  Some information was entered later than the data was gathered due to patient care needs. The stated time with the data is accurate.  Patient was in his bed for bedside treatment  Alert disoriented, he was yelling out intermittently without being able to communicate what was wrong.  He makes eye contact.  Initially attempted to use the R IJ HD cath because of his agitation and random movements.  Once he was connected to the dialysis machine, he was yelling and thrashing as though he were in pain.  Treatment was discontinued immediately.  Treatment re initiated using his left upper arm fistula successfully.   Patient yelled at staff  and in general during treatment.  He was not oriented, but did respond to his name.  New medication started by patient nurse and Np to assist patient to address the patient's level of agitation.  See MAR  Patient BP dropped at end of dialysis.  NP and patient nurse made aware.  Dex stopped  Patient's BP began to recover prior to this writer leaving the room.  Hand-off given to patient's nurse.     Total UF removed: [facility]

## 2022-04-18 ENCOUNTER — Inpatient Hospital Stay (HOSPITAL_COMMUNITY): Payer: Medicare Other

## 2022-04-18 DIAGNOSIS — E43 Unspecified severe protein-calorie malnutrition: Secondary | ICD-10-CM | POA: Insufficient documentation

## 2022-04-18 DIAGNOSIS — G9341 Metabolic encephalopathy: Secondary | ICD-10-CM | POA: Diagnosis not present

## 2022-04-18 DIAGNOSIS — K922 Gastrointestinal hemorrhage, unspecified: Secondary | ICD-10-CM | POA: Diagnosis not present

## 2022-04-18 DIAGNOSIS — F10931 Alcohol use, unspecified with withdrawal delirium: Secondary | ICD-10-CM | POA: Diagnosis not present

## 2022-04-18 DIAGNOSIS — J9601 Acute respiratory failure with hypoxia: Secondary | ICD-10-CM | POA: Diagnosis not present

## 2022-04-18 LAB — COMPREHENSIVE METABOLIC PANEL
ALT: 22 U/L (ref 0–44)
AST: 81 U/L — ABNORMAL HIGH (ref 15–41)
Albumin: 1.8 g/dL — ABNORMAL LOW (ref 3.5–5.0)
Alkaline Phosphatase: 68 U/L (ref 38–126)
Anion gap: 15 (ref 5–15)
BUN: 36 mg/dL — ABNORMAL HIGH (ref 8–23)
CO2: 25 mmol/L (ref 22–32)
Calcium: 8.3 mg/dL — ABNORMAL LOW (ref 8.9–10.3)
Chloride: 92 mmol/L — ABNORMAL LOW (ref 98–111)
Creatinine, Ser: 5.92 mg/dL — ABNORMAL HIGH (ref 0.61–1.24)
GFR, Estimated: 10 mL/min — ABNORMAL LOW (ref >60)
Glucose, Bld: 125 mg/dL — ABNORMAL HIGH (ref 70–99)
Potassium: 3.4 mmol/L — ABNORMAL LOW (ref 3.5–5.1)
Sodium: 132 mmol/L — ABNORMAL LOW (ref 135–145)
Total Bilirubin: 1 mg/dL (ref 0.3–1.2)
Total Protein: 6.9 g/dL (ref 6.5–8.1)

## 2022-04-18 LAB — TYPE AND SCREEN
ABO/RH(D): B POS
Antibody Screen: NEGATIVE
Unit division: 0
Unit division: 0
Unit division: 0
Unit division: 0
Unit division: 0

## 2022-04-18 LAB — GLUCOSE, CAPILLARY
Glucose-Capillary: 113 mg/dL — ABNORMAL HIGH (ref 70–99)
Glucose-Capillary: 114 mg/dL — ABNORMAL HIGH (ref 70–99)
Glucose-Capillary: 120 mg/dL — ABNORMAL HIGH (ref 70–99)
Glucose-Capillary: 126 mg/dL — ABNORMAL HIGH (ref 70–99)
Glucose-Capillary: 127 mg/dL — ABNORMAL HIGH (ref 70–99)
Glucose-Capillary: 133 mg/dL — ABNORMAL HIGH (ref 70–99)

## 2022-04-18 LAB — BLOOD GAS, VENOUS
Acid-Base Excess: 0 mmol/L (ref 0.0–2.0)
Bicarbonate: 27.2 mmol/L (ref 20.0–28.0)
O2 Saturation: 96.9 %
Patient temperature: 37
pCO2, Ven: 54 mmHg (ref 44–60)
pH, Ven: 7.31 (ref 7.25–7.43)
pO2, Ven: 77 mmHg — ABNORMAL HIGH (ref 32–45)

## 2022-04-18 LAB — BPAM RBC
Blood Product Expiration Date: 202401072359
Blood Product Expiration Date: 202401072359
Blood Product Expiration Date: 202401112359
Blood Product Expiration Date: 202401182359
Blood Product Expiration Date: 202401192359
ISSUE DATE / TIME: 202312181614
ISSUE DATE / TIME: 202312181614
ISSUE DATE / TIME: 202312190019
ISSUE DATE / TIME: 202312191848
Unit Type and Rh: 5100
Unit Type and Rh: 5100
Unit Type and Rh: 7300
Unit Type and Rh: 7300
Unit Type and Rh: 7300

## 2022-04-18 LAB — CBC
HCT: 22.2 % — ABNORMAL LOW (ref 39.0–52.0)
Hemoglobin: 7.6 g/dL — ABNORMAL LOW (ref 13.0–17.0)
MCH: 33.3 pg (ref 26.0–34.0)
MCHC: 34.2 g/dL (ref 30.0–36.0)
MCV: 97.4 fL (ref 80.0–100.0)
Platelets: 79 10*3/uL — ABNORMAL LOW (ref 150–400)
RBC: 2.28 MIL/uL — ABNORMAL LOW (ref 4.22–5.81)
RDW: 19 % — ABNORMAL HIGH (ref 11.5–15.5)
WBC: 2.9 10*3/uL — ABNORMAL LOW (ref 4.0–10.5)
nRBC: 0 % (ref 0.0–0.2)

## 2022-04-18 LAB — CULTURE, RESPIRATORY W GRAM STAIN
Culture: NORMAL
Gram Stain: NONE SEEN

## 2022-04-18 LAB — MAGNESIUM
Magnesium: 1.9 mg/dL (ref 1.7–2.4)
Magnesium: 1.9 mg/dL (ref 1.7–2.4)
Magnesium: 1.9 mg/dL (ref 1.7–2.4)

## 2022-04-18 LAB — PHOSPHORUS
Phosphorus: 6.9 mg/dL — ABNORMAL HIGH (ref 2.5–4.6)
Phosphorus: 6.7 mg/dL — ABNORMAL HIGH (ref 2.5–4.6)

## 2022-04-18 LAB — AMMONIA: Ammonia: 30 umol/L (ref 9–35)

## 2022-04-18 MED ORDER — PROSOURCE TF20 ENFIT COMPATIBL EN LIQD
60.0000 mL | Freq: Every day | ENTERAL | Status: DC
  Administered 2022-04-18 – 2022-04-27 (×10): 60 mL
  Filled 2022-04-18 (×10): qty 60

## 2022-04-18 MED ORDER — THIAMINE HCL 100 MG/ML IJ SOLN
500.0000 mg | Freq: Three times a day (TID) | INTRAVENOUS | Status: AC
  Administered 2022-04-18 – 2022-04-23 (×15): 500 mg via INTRAVENOUS
  Filled 2022-04-18 (×15): qty 5

## 2022-04-18 MED ORDER — VITAL 1.5 CAL PO LIQD
1000.0000 mL | ORAL | Status: DC
  Administered 2022-04-18 – 2022-04-27 (×11): 1000 mL
  Filled 2022-04-18 (×10): qty 1000

## 2022-04-18 MED ORDER — LACTULOSE 10 GM/15ML PO SOLN
10.0000 g | Freq: Three times a day (TID) | ORAL | Status: DC
  Administered 2022-04-18 – 2022-04-19 (×3): 10 g
  Filled 2022-04-18 (×3): qty 15

## 2022-04-18 MED ORDER — POTASSIUM CHLORIDE 20 MEQ PO PACK
60.0000 meq | PACK | Freq: Once | ORAL | Status: AC
  Administered 2022-04-18: 60 meq
  Filled 2022-04-18: qty 3

## 2022-04-18 MED ORDER — ALBUTEROL SULFATE (2.5 MG/3ML) 0.083% IN NEBU
2.5000 mg | INHALATION_SOLUTION | RESPIRATORY_TRACT | Status: AC | PRN
  Administered 2022-04-18: 2.5 mg via RESPIRATORY_TRACT
  Filled 2022-04-18: qty 3

## 2022-04-18 MED ORDER — SODIUM CHLORIDE 0.9 % IV SOLN
1.0000 mg | Freq: Every day | INTRAVENOUS | Status: DC
  Administered 2022-04-18 – 2022-04-23 (×6): 1 mg via INTRAVENOUS
  Filled 2022-04-18 (×7): qty 0.2

## 2022-04-18 MED ORDER — RENA-VITE PO TABS
1.0000 | ORAL_TABLET | Freq: Every day | ORAL | Status: DC
  Administered 2022-04-18 – 2022-04-29 (×12): 1
  Filled 2022-04-18 (×12): qty 1

## 2022-04-18 NOTE — Progress Notes (Addendum)
NAME:  Daniel [name], MRN:  [id number], DOB:  02/16/1961, LOS: 4 ADMISSION DATE:  04/14/2022, CONSULTATION DATE:  04/14/2022 REFERRING MD:  Dr. Avon Gully, CHIEF COMPLAINT:  UGIB    History of Present Illness:   [name], past medical history of peripheral arterial disease, dialysis patient Monday, end-stage renal disease, type 2 diabetes hepatocellular carcinoma status post radiation. seen in pulm clinic before for a lung nodule which on bronchoscopic eval / path was not c/w malignancy .  He has had GI bleeding in the past with 2 duodenal ulcers a AVM alcoholic cirrhosis with his known history hepatocellular carcinoma.  In the ER he was also found to be COVID-positive.  He ultimately had a large volume of bloody emesis in the emergency room he had respiratory failure and was intubated.  Pulmonary critical care was consulted for ICU was consulted for admission.  He had already been seen by hospitalist and admitted with GI consultation and IR but declined while he was in theEmergency department.   Pertinent Medical History   Past Medical History:  Diagnosis Date   Anemia    Diabetes mellitus without complication ([place])    patient denies   Dialysis patient [facility])    End stage chronic kidney disease ([place])    Hypertension    ICH (intracerebral hemorrhage) ([place]) 05/20/2017   PAD (peripheral artery disease) ([facility])    Shoulder pain, left 06/28/2013     Significant Hospital Events: Including procedures, antibiotic start and stop dates in addition to other pertinent events   12/17 covid + with generalized body sx, left ama 12/18 admitted with GIB. Intubated for hematemesis 12/19 starting on covid therapies   Interim History / Subjective:  iHD yesterday, 2.5L off via LUE AVF Remains on D10 gtt as remains NPO and encephalopathic on CIWA requiring ~'2mg'$  ativan average q4hrs  Afebrile  Weaning HHFNC, currently on 40%, 20L  Objective   Blood pressure 127/66, pulse 75,  temperature 98.1 F (36.7 C), temperature source Axillary, resp. rate 17, weight 100 kg, SpO2 93 %. CVP:  [2 mmHg-61 mmHg] 61 mmHg  FiO2 (%):  [40 %-52 %] 40 %   Intake/Output Summary (Last 24 hours) at 04/18/2022 1044 Last data filed at 04/18/2022 1000 Gross per 24 hour  Intake 919.92 ml  Output 2500 ml  Net -1580.08 ml   Filed Weights   04/16/22 0500 04/17/22 0345 04/18/22 0500  Weight: 100.5 kg 100.3 kg 100 kg   Examination: General:  AoC ill appearing older male sitting upright in bed HEENT: MM pink/moist, pupils 3/reactive, no stridor Neuro: awakens to touch, makes eye contact, groans but does not f/c.  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, Grade 1 esophageal varices, cirrhosis - appreciate [place] GI input, no plans to scope, following peripherally - cont octreotide BID then recs for octreotide LAR prior to discharge but  unable to get inpt - PPI BID - H/H remains stable - monitor coagulapathy - appreciate PMT assistance    Acute respiratory failure with hypoxia on mechanical ventilation Covid infection with pneumonia - self extubated 11/22 - remains high risk for reintubation given encephalopathy  - cont to wean HHFNC as tolerated - aspiration precautions/ NPO - cont empiric remdesivir, no steroids given GIB - ctx day 4/x - follow trach asp - volume removal with iHD  -  intermittent CXR, getting one now and VBG to rule out hypercarbia   ESRD on iHD, poorly compliant  Hyponatremia Hypokalemia  - per Nephrology, appreciate assistance - next iHD scheduled for 12/24 - remove R IJ temp HD - trend renal indices, strict I/O   Hypoglycemia - remains on D10 gtt at 62m/hr with thiamine - pending cortrak placement - start TF per RD recs, high risk for refeeding given hx  Thrombocytopenia-chronic due to cirrhosis Anemia, acute on chronic.  Baseline hemoglobin 7.5-8. - no s/s bleeding.  H/H stable  - Transfuse for hemoglobin less than 7 or hemodynamically significant bleeding  Alcohol abuse - cont MVI/ folate/ thiamine - CIWA with ativan   Encephalopathy, metabolic/ toxic, concern for early withdrawal - supportive care as above - VBG to rule out hypercarbia - monitor closely for ETOH withdrawals - trend ammonia  - delirium precautions  Overall poor prognosis and limited options moving forward.  History of poor medical compliance and ongoing ETOH abuse.  Appreciate palliative care's input.  Patient's brother was able to be reached by PMT with plans to set up a family meeting for GPagethis weekend.   Best Practice (right click and "Reselect all SmartList Selections" daily)   Diet/type: NPO pending cortrak DVT prophylaxis: SCD GI prophylaxis: PPI BID Lines: Central line and No longer needed.  Order written to d/c  Foley:  N/A Code Status:  full code Last date of multidisciplinary goals of care discussion [we will reach out to his family]  Pending 12/22  Labs   CBC: Recent Labs  Lab 04/14/22 1323 04/14/22 1837 04/15/22 1701 04/16/22 0341 04/16/22 1007 04/17/22 0405 04/17/22 0615 04/18/22 0407  WBC 5.8   < > 4.8 4.5 4.4 4.4  --  2.9*  NEUTROABS 4.3  --   --   --   --   --   --   --   HGB 5.7*   < > 6.7* 7.9* 8.3* 8.0* 8.5* 7.6*  HCT 17.4*   < > 19.9* 23.4* 24.6* 24.1* 25.0* 22.2*  MCV 102.4*   < > 96.6 95.9 95.7 96.4  --  97.4  PLT 123*    < > 107* 98* 95* 90*  --  79*   < > = values in this interval not displayed.    Basic Metabolic Panel: Recent Labs  Lab 04/15/22 0840 04/15/22 1846 04/15/22 2022 04/16/22 0341 04/17/22 0405 04/17/22 0615 04/18/22 0407  NA 132* 131*  --  131* 130* 131* 132*  K 3.3* 3.7  --  3.8 3.7 4.0 3.4*  CL 96* 93*  --  94* 96*  --  92*  CO2 27 26  --  24 22  --  25  GLUCOSE 70 78  --  83 99  --  125*  BUN 52* 61*  --  64* 68*  --  36*  CREATININE 5.53* 6.56*  --  7.20* 8.27*  --  5.92*  CALCIUM 7.8* 7.8*  --  7.9* 7.8*  --  8.3*  MG  --   --  1.6*  --  1.5*  --  1.9  PHOS  --  4.2  --   --   --   --   --    GFR: Estimated Creatinine Clearance: 15.6 mL/min (A) (by C-G formula based on SCr of 5.92 mg/dL (H)). Recent Labs  Lab 04/14/22 1459 04/14/22 1837 04/15/22 0840 04/15/22 1052 04/15/22 1701 04/16/22 0341 04/16/22 1007 04/17/22 0405 04/18/22 0407  PROCALCITON  --   --   --   --  2.07 2.49  --  2.07  --   WBC  --  6.5  --    < > 4.8 4.5 4.4 4.4 2.9*  LATICACIDVEN 5.9* 4.0* 1.7  --   --   --   --   --   --    < > = values in this interval not displayed.    Liver Function Tests: Recent Labs  Lab 04/14/22 1459 04/15/22 0840 04/15/22 1846 04/16/22 0341 04/17/22 0405 04/18/22 0407  AST 103* 117*  --  118* 90* 81*  ALT 28 33  --  32 26 22  ALKPHOS 75 66  --  65 70 68  BILITOT 1.5* 1.5*  --  1.2 1.2 1.0  PROT 7.2 6.6  --  6.3* 6.4* 6.9  ALBUMIN 1.7* 1.6* <1.5* 1.5* <1.5* 1.8*   Recent Labs  Lab 04/14/22 1459  LIPASE 57*   Recent Labs  Lab 04/14/22 1837 04/16/22 0341 04/17/22 0405 04/18/22 0407  AMMONIA 45* 25* 33 30     This patient is critically ill with multiple organ system failure which requires frequent high complexity decision making, assessment, support, evaluation, and titration of therapies. This was completed through the application of advanced monitoring technologies and extensive interpretation of multiple databases. During this encounter critical  care time was devoted to patient care services described in this note for 32 minutes.   Kennieth Rad, Edmonia Caprio [facility] 04/18/2022, 10:44 AM  See Amion for pager If no response to pager, please call PCCM consult pager After 7:00 pm call Elink

## 2022-04-18 NOTE — Progress Notes (Signed)
Patient ID: Daniel [name], male   DOB: 1960/11/30, 61 y.o.   MRN: [id number] S:Tolerated HD well yesterday.  No events overnight but remains confused and agitated.  O:BP 136/77   Pulse 79   Temp 98.1 F (36.7 C) (Axillary)   Resp 16   Wt 100 kg   SpO2 95%   BMI 30.75 kg/m   Intake/Output Summary (Last 24 hours) at 04/18/2022 1003 Last data filed at 04/18/2022 0900 Gross per 24 hour  Intake 909.92 ml  Output 2500 ml  Net -1590.08 ml   Intake/Output: I/O last 3 completed shifts: In: 1379.8 [I.V.:1002.3; IV Piggyback:377.5] Out: 2500 [Other:2500]  Intake/Output this shift:  Total I/O In: 20 [I.V.:20] Out: -  Weight change: -0.3 kg LKJ:ZPHXT comfortably in bed   Recent Labs  Lab 04/14/22 1323 04/14/22 1459 04/14/22 1939 04/15/22 0840 04/15/22 1846 04/16/22 0341 04/17/22 0405 04/17/22 0615 04/18/22 0407  NA 130* 130* 131* 132* 131* 131* 130* 131* 132*  K 5.1 5.3* 5.6* 3.3* 3.7 3.8 3.7 4.0 3.4*  CL 93* 92*  --  96* 93* 94* 96*  --  92*  CO2 18* 18*  --  '27 26 24 22  '$ --  25  GLUCOSE 104* 104*  --  70 78 83 99  --  125*  BUN 95* 98*  --  52* 61* 64* 68*  --  36*  CREATININE 9.83* 10.05*  --  5.53* 6.56* 7.20* 8.27*  --  5.92*  ALBUMIN  --  1.7*  --  1.6* <1.5* 1.5* <1.5*  --  1.8*  CALCIUM 8.5* 8.6*  --  7.8* 7.8* 7.9* 7.8*  --  8.3*  PHOS  --   --   --   --  4.2  --   --   --   --   AST  --  103*  --  117*  --  118* 90*  --  81*  ALT  --  28  --  33  --  32 26  --  22   Liver Function Tests: Recent Labs  Lab 04/16/22 0341 04/17/22 0405 04/18/22 0407  AST 118* 90* 81*  ALT 32 26 22  ALKPHOS 65 70 68  BILITOT 1.2 1.2 1.0  PROT 6.3* 6.4* 6.9  ALBUMIN 1.5* <1.5* 1.8*   Recent Labs  Lab 04/14/22 1459  LIPASE 57*   Recent Labs  Lab 04/16/22 0341 04/17/22 0405 04/18/22 0407  AMMONIA 47* 33 30   CBC: Recent Labs  Lab 04/14/22 1323 04/14/22 1837 04/15/22 1701 04/16/22 0341 04/16/22 1007 04/17/22 0405 04/17/22 0615 04/18/22 0407  WBC 5.8    < > 4.8 4.5 4.4 4.4  --  2.9*  NEUTROABS 4.3  --   --   --   --   --   --   --   HGB 5.7*   < > 6.7* 7.9* 8.3* 8.0* 8.5* 7.6*  HCT 17.4*   < > 19.9* 23.4* 24.6* 24.1* 25.0* 22.2*  MCV 102.4*   < > 96.6 95.9 95.7 96.4  --  97.4  PLT 123*   < > 107* 98* 95* 90*  --  79*   < > = values in this interval not displayed.   Cardiac Enzymes: No results for input(s): "CKTOTAL", "CKMB", "CKMBINDEX", "TROPONINI" in the last 168 hours. CBG: Recent Labs  Lab 04/17/22 1645 04/17/22 1959 04/18/22 0008 04/18/22 0403 04/18/22 0941  GLUCAP 107* 103* 120* 126* 133*    Iron Studies: No results for input(s): "  IRON", "TIBC", "TRANSFERRIN", "FERRITIN" in the last 72 hours. Studies/Results: No results found.  albuterol  2.5 mg Nebulization Q4H   Chlorhexidine Gluconate Cloth  6 each Topical Q0600   LORazepam  0-4 mg Intravenous Q4H   Followed by   Derrill Memo ON 04/19/2022] LORazepam  0-4 mg Intravenous Q8H   multivitamin with minerals  1 tablet Per Tube Daily   pantoprazole  40 mg Intravenous Q12H   thiamine (VITAMIN B1) injection  100 mg Intravenous Daily    BMET    Component Value Date/Time   NA 132 (L) 04/18/2022 0407   NA 138 08/22/2020 1141   K 3.4 (L) 04/18/2022 0407   CL 92 (L) 04/18/2022 0407   CO2 25 04/18/2022 0407   GLUCOSE 125 (H) 04/18/2022 0407   BUN 36 (H) 04/18/2022 0407   BUN 20 08/22/2020 1141   CREATININE 5.92 (H) 04/18/2022 0407   CREATININE 6.60 (HH) 02/06/2022 0943   CALCIUM 8.3 (L) 04/18/2022 0407   CALCIUM 7.9 (L) 08/01/2019 0702   GFRNONAA 10 (L) 04/18/2022 0407   GFRNONAA 9 (L) 02/06/2022 0943   GFRAA 7 (L) 12/28/2019 1640   CBC    Component Value Date/Time   WBC 2.9 (L) 04/18/2022 0407   RBC 2.28 (L) 04/18/2022 0407   HGB 7.6 (L) 04/18/2022 0407   HGB 9.0 (L) 10/03/2021 0900   HGB 8.4 (L) 08/22/2020 1141   HCT 22.2 (L) 04/18/2022 0407   HCT 25.0 (L) 08/22/2020 1141   PLT 79 (L) 04/18/2022 0407   PLT 142 (L) 10/03/2021 0900   PLT 184 08/22/2020 1141    MCV 97.4 04/18/2022 0407   MCV 94 08/22/2020 1141   MCH 33.3 04/18/2022 0407   MCHC 34.2 04/18/2022 0407   RDW 19.0 (H) 04/18/2022 0407   RDW 16.7 (H) 08/22/2020 1141   LYMPHSABS 0.9 04/14/2022 1323   LYMPHSABS 1.8 08/22/2020 1141   MONOABS 0.6 04/14/2022 1323   EOSABS 0.0 04/14/2022 1323   EOSABS 0.3 08/22/2020 1141   BASOSABS 0.0 04/14/2022 1323   BASOSABS 0.0 08/22/2020 1141    Dialysis Orders:  MWF [place] 4hr, 400/A1.5, EDW 98.8kg, 2K/2Ca, AVF, no heparin - Hectoral 36mg IV q HD - Appears to get IV iron with oncology, no ESA d/t cancer   Assessment/Plan:  Acute hypoxic respiratory failure - following large volume emesis of blood and intubated in the ED.  Currently self-extubated yesterday morning., plan per PCCM ABLA - acute on chronic with evidence of GI bleed.  Known AVM's.  Gi consulted and plan for blood transfusion. On octreotide and protonix.  Continue to transfuse prn.  Covid-19 Positive - without hypoxia but does have N/V/D.  Quarantine for 5 days per protocol.  Treatment per primary svc  ESRD -  off schedule, and will get back on schedule Sunday (holiday schedule for MWF patients)  Hypertension/volume  - stable  Anemia  - as above, for blood transfusion and follow.  Metabolic bone disease -  continue with meds  Nutrition - renal diet  Medical noncompliance - multiple admissions with ongoing issue  Alcohol abuse disorder - high risk for withdrawal, per primary  Hepatocellular carcinoma - s/p Y90 on 11/28/21.  Hyperkalemia - mild and due to noncompliance with HD and GIB.  Resolved with HD.  Disposition - poor overall prognosis with multiple admissions for the past several months.  Agree with palliative care consult to help set goals/limits of care.   JDonetta Potts MD [facility]

## 2022-04-18 NOTE — Procedures (Signed)
Cortrak  Person Inserting Tube:  Maylon Peppers C, RD Tube Type:  Cortrak - 43 inches Tube Size:  10 Tube Location:  Left nare Secured by: Bridle Technique Used to Measure Tube Placement:  Marking at nare/corner of mouth Cortrak Secured At:  74 cm   Cortrak Tube Team Note:  Consult received to place a Cortrak feeding tube.   X-ray is required, abdominal x-ray has been ordered by the Cortrak team. Please confirm tube placement before using the Cortrak tube.   If the tube becomes dislodged please keep the tube and contact the Cortrak team at [email or web address] for replacement.  If after hours and replacement cannot be delayed, place a NG tube and confirm placement with an abdominal x-ray.    Lockie Pares., RD, LDN, CNSC See AMiON for contact information

## 2022-04-18 NOTE — Progress Notes (Signed)
Pt receives out-pt HD at [facility] on MWF. Will assist as needed.   Tywanda Rice Renal Navigator [phone] 

## 2022-04-18 NOTE — Progress Notes (Signed)
Initial Nutrition Assessment  DOCUMENTATION CODES:   Severe malnutrition in context of chronic illness  INTERVENTION:   Initiate tube feeds via Cortrak: - Start Vital 1.5 @ 20 ml/hr and advance rate by 10 ml q 8 hours to goal rate of 65 ml/hr (1560 ml/day) - PROSource TF20 60 ml daily  Tube feeding regimen at goal rate provides 2420 kcal, 125 grams of protein, and 1192 ml of H2O.   Monitor magnesium, potassium, and phosphorus BID for at least 3 days, MD to replete as needed, as pt is at risk for refeeding syndrome given malnutrition.  - d/c MVI with minerals and order renal MVI daily per tube  - Continue thiamine and folic acid daily  NUTRITION DIAGNOSIS:   Severe Malnutrition related to chronic illness (alcoholic cirrhosis, hepatocellular carcinoma, ESRD) as evidenced by severe fat depletion, severe muscle depletion.  GOAL:   Patient will meet greater than or equal to 90% of their needs  MONITOR:   Diet advancement, Labs, Weight trends, TF tolerance, I & O's  REASON FOR ASSESSMENT:   Consult Assessment of nutrition requirement/status  ASSESSMENT:   61 year old male who presented to the ED on 12/18 with hematemesis. PMH of recurrent GI bleed from AVMs, alcoholic cirrhosis, hepatocellular carcinoma, ESRD on HD, T2DM, HTN, ICH in 2019, PAD s/p L BKA, chronic anemia, ETOH abuse. Pt required intubation in the ED and tested positive for COVID-19. Pt admitted with recurrent GI bleed, COVID-19 PNA.  12/21 - self-extubated 12/22 - Cortrak placed (x-ray pending)  Consult received for enteral nutrition initiation and management. Cortrak placed today. Abdominal x-ray pending. Pt is NPO.  Unable to obtain diet and weight history at this time. Reviewed weight history in chart. Overall, pt's weight is down 27.2 kg over the last 1 year. This is a 21.4% weight loss which is severe and significant for timeframe. Pt's weight has fluctuated over the last year, likely related to ESRD/HD and  volume status. Overall weight trend has been down. Current weight is likely falsely elevated due to volume overload as pt with moderate pitting edema. Pt meets criteria for severe malnutrition based on NFPE.  Admit weight: 99.2 kg Current weight: 100 kg  Medications reviewed and include: folic acid, 1 mg daily, IV ativan, magnesium oxide 400 mg BID, MVI with minerals daily, IV protonix, IV thiamine 100 mg daily, IV abx IVF: D10 @ 10 ml/hr  Labs reviewed: sodium 132, potassium 3.4, chloride 92, ionized calcium 1.10, WBC 2.9, hemoglobin 7.6, platelets 79 CBG's: 99-126 x 24 hours  I/O's: +1.4 L since admit  NUTRITION - FOCUSED PHYSICAL EXAM:  Flowsheet Row Most Recent Value  Orbital Region Severe depletion  Upper Arm Region Moderate depletion  Thoracic and Lumbar Region Moderate depletion  Buccal Region Severe depletion  Temple Region Severe depletion  Clavicle Bone Region Moderate depletion  Clavicle and Acromion Bone Region Severe depletion  Scapular Bone Region Severe depletion  Dorsal Hand Unable to assess  [edema]  Patellar Region Moderate depletion  Anterior Thigh Region Moderate depletion  Posterior Calf Region Moderate depletion  [R calf only due to L BKA]  Edema (RD Assessment) Moderate  Hair Reviewed  Eyes Reviewed  Mouth Reviewed  Skin Reviewed  Nails Reviewed       Diet Order:   Diet Order             Diet NPO time specified  Diet effective now  EDUCATION NEEDS:   Not appropriate for education at this time  Skin:  Skin Assessment: Reviewed RN Assessment  Last BM:  04/16/22  Height:   Ht Readings from Last 1 Encounters:  04/06/22 '5\' 11"'$  (1.803 m)    Weight:   Wt Readings from Last 1 Encounters:  04/18/22 100 kg    Ideal Body Weight:  78.2 kg  BMI:  Body mass index is 30.75 kg/m.  Estimated Nutritional Needs:   Kcal:  9622-2979  Protein:  110-130 grams  Fluid:  >2.0 L    Gustavus Bryant, MS, RD, LDN Inpatient  Clinical Dietitian Please see AMiON for contact information.

## 2022-04-18 NOTE — Progress Notes (Signed)
eLink Physician-Brief Progress Note Patient Name: Daniel [name] DOB: 1961/02/01 MRN: [id number]   Date of Service  04/18/2022  HPI/Events of Note  Patient is wheezy per assessment of bedside RN.   eICU Interventions  Albuterol breathing treatments ordered PRN.        Frederik Pear 04/18/2022, 11:32 PM

## 2022-04-18 NOTE — Progress Notes (Addendum)
2000 patient alert to self not following all commands very weak not moving ext off bed in bilateral wrist restraints ROM completed patient has secretions that he swallows intermittently but sounds wet when listening to lung sounds exp wheezing noted also had abdominal breathing . Patient moaning out very loud repositioned patient still moaning out unable to comprehend what he is saying CIWA scored 2 mg ativan given  2100 patient lethargic still responds to name, SCDs obtained and placed on right lower leg as ordered  2200 all meds given as ordered bilateral wrist restraints removed and bilateral mitts placed on patient no signs of pulling at lines and tubes and patient still very weak not following all commands will monitor with mitts to ensure they are appropriate for patient to wear in place of wrist restraints.

## 2022-04-19 ENCOUNTER — Inpatient Hospital Stay (HOSPITAL_COMMUNITY): Payer: Medicare Other

## 2022-04-19 DIAGNOSIS — U071 COVID-19: Secondary | ICD-10-CM | POA: Diagnosis not present

## 2022-04-19 DIAGNOSIS — E162 Hypoglycemia, unspecified: Secondary | ICD-10-CM | POA: Diagnosis not present

## 2022-04-19 DIAGNOSIS — E7220 Disorder of urea cycle metabolism, unspecified: Secondary | ICD-10-CM

## 2022-04-19 DIAGNOSIS — J9601 Acute respiratory failure with hypoxia: Secondary | ICD-10-CM | POA: Diagnosis not present

## 2022-04-19 LAB — GLUCOSE, CAPILLARY
Glucose-Capillary: 108 mg/dL — ABNORMAL HIGH (ref 70–99)
Glucose-Capillary: 115 mg/dL — ABNORMAL HIGH (ref 70–99)
Glucose-Capillary: 118 mg/dL — ABNORMAL HIGH (ref 70–99)
Glucose-Capillary: 122 mg/dL — ABNORMAL HIGH (ref 70–99)
Glucose-Capillary: 123 mg/dL — ABNORMAL HIGH (ref 70–99)
Glucose-Capillary: 132 mg/dL — ABNORMAL HIGH (ref 70–99)

## 2022-04-19 LAB — COMPREHENSIVE METABOLIC PANEL
ALT: 20 U/L (ref 0–44)
AST: 66 U/L — ABNORMAL HIGH (ref 15–41)
Albumin: 1.7 g/dL — ABNORMAL LOW (ref 3.5–5.0)
Alkaline Phosphatase: 70 U/L (ref 38–126)
Anion gap: 13 (ref 5–15)
BUN: 44 mg/dL — ABNORMAL HIGH (ref 8–23)
CO2: 24 mmol/L (ref 22–32)
Calcium: 7.9 mg/dL — ABNORMAL LOW (ref 8.9–10.3)
Chloride: 95 mmol/L — ABNORMAL LOW (ref 98–111)
Creatinine, Ser: 7.04 mg/dL — ABNORMAL HIGH (ref 0.61–1.24)
GFR, Estimated: 8 mL/min — ABNORMAL LOW (ref >60)
Glucose, Bld: 113 mg/dL — ABNORMAL HIGH (ref 70–99)
Potassium: 4.1 mmol/L (ref 3.5–5.1)
Sodium: 132 mmol/L — ABNORMAL LOW (ref 135–145)
Total Bilirubin: 1.1 mg/dL (ref 0.3–1.2)
Total Protein: 6.9 g/dL (ref 6.5–8.1)

## 2022-04-19 LAB — CBC
HCT: 23.9 % — ABNORMAL LOW (ref 39.0–52.0)
Hemoglobin: 7.7 g/dL — ABNORMAL LOW (ref 13.0–17.0)
MCH: 31.7 pg (ref 26.0–34.0)
MCHC: 32.2 g/dL (ref 30.0–36.0)
MCV: 98.4 fL (ref 80.0–100.0)
Platelets: 70 10*3/uL — ABNORMAL LOW (ref 150–400)
RBC: 2.43 MIL/uL — ABNORMAL LOW (ref 4.22–5.81)
RDW: 18.8 % — ABNORMAL HIGH (ref 11.5–15.5)
WBC: 3.8 10*3/uL — ABNORMAL LOW (ref 4.0–10.5)
nRBC: 0 % (ref 0.0–0.2)

## 2022-04-19 LAB — MAGNESIUM
Magnesium: 1.9 mg/dL (ref 1.7–2.4)
Magnesium: 1.6 mg/dL — ABNORMAL LOW (ref 1.7–2.4)

## 2022-04-19 LAB — AMMONIA: Ammonia: 43 umol/L — ABNORMAL HIGH (ref 9–35)

## 2022-04-19 LAB — PHOSPHORUS
Phosphorus: 7.3 mg/dL — ABNORMAL HIGH (ref 2.5–4.6)
Phosphorus: 3.7 mg/dL (ref 2.5–4.6)

## 2022-04-19 LAB — PROCALCITONIN: Procalcitonin: 1.27 ng/mL

## 2022-04-19 MED ORDER — HEPARIN SODIUM (PORCINE) 5000 UNIT/ML IJ SOLN
5000.0000 [IU] | Freq: Three times a day (TID) | INTRAMUSCULAR | Status: DC
  Administered 2022-04-19 – 2022-05-05 (×45): 5000 [IU] via SUBCUTANEOUS
  Filled 2022-04-19 (×46): qty 1

## 2022-04-19 MED ORDER — ORAL CARE MOUTH RINSE
15.0000 mL | OROMUCOSAL | Status: DC
  Administered 2022-04-19 – 2022-05-05 (×45): 15 mL via OROMUCOSAL

## 2022-04-19 MED ORDER — MAGNESIUM SULFATE 2 GM/50ML IV SOLN
2.0000 g | Freq: Once | INTRAVENOUS | Status: AC
  Administered 2022-04-20: 2 g via INTRAVENOUS
  Filled 2022-04-19: qty 50

## 2022-04-19 MED ORDER — IPRATROPIUM-ALBUTEROL 0.5-2.5 (3) MG/3ML IN SOLN
3.0000 mL | RESPIRATORY_TRACT | Status: DC
  Administered 2022-04-19 – 2022-04-20 (×7): 3 mL via RESPIRATORY_TRACT
  Filled 2022-04-19 (×7): qty 3

## 2022-04-19 MED ORDER — CALCIUM CARBONATE ANTACID 1250 MG/5ML PO SUSP
500.0000 mg | Freq: Three times a day (TID) | ORAL | Status: DC
  Administered 2022-04-19 – 2022-04-25 (×18): 500 mg
  Filled 2022-04-19 (×22): qty 5

## 2022-04-19 MED ORDER — LACTULOSE 10 GM/15ML PO SOLN
10.0000 g | Freq: Four times a day (QID) | ORAL | Status: DC
  Administered 2022-04-19 – 2022-04-29 (×39): 10 g
  Filled 2022-04-19 (×40): qty 15

## 2022-04-19 MED ORDER — IPRATROPIUM-ALBUTEROL 0.5-2.5 (3) MG/3ML IN SOLN
3.0000 mL | RESPIRATORY_TRACT | Status: DC | PRN
  Administered 2022-04-19: 3 mL via RESPIRATORY_TRACT
  Filled 2022-04-19: qty 3

## 2022-04-19 MED ORDER — ORAL CARE MOUTH RINSE
15.0000 mL | OROMUCOSAL | Status: DC | PRN
  Administered 2022-04-19 – 2022-04-21 (×2): 15 mL via OROMUCOSAL

## 2022-04-19 NOTE — Progress Notes (Signed)
Patient ID: Daniel [name], male   DOB: July 29, 1960, 61 y.o.   MRN: [id number] S: Pt noted to be wheezing this am and was given nebulizers. O:BP (!) 144/73 (BP Location: Right Arm)   Pulse 78   Temp 97.6 F (36.4 C) (Oral)   Resp 20   Wt 103.4 kg   SpO2 98%   BMI 31.79 kg/m   Intake/Output Summary (Last 24 hours) at 04/19/2022 0946 Last data filed at 04/19/2022 0700 Gross per 24 hour  Intake 947.76 ml  Output 30 ml  Net 917.76 ml   Intake/Output: I/O last 3 completed shifts: In: 1214.3 [I.V.:384.8; NG/GT:426; IV Piggyback:403.5] Out: 30 [Emesis/NG output:30]  Intake/Output this shift:  No intake/output data recorded. Weight change: 3.4 kg Gen: lethargic, coughing CVS:RRR Resp: bilateral wheezing, poor inspiratory effort Abd: +BS, soft, nt/ND Ext:S/P LAKA, 1+ ankle edema of RLE, LUE AVF +T/B  Recent Labs  Lab 04/14/22 1459 04/14/22 1939 04/15/22 0840 04/15/22 1846 04/16/22 0341 04/17/22 0405 04/17/22 0615 04/18/22 0407 04/18/22 1420 04/18/22 1841 04/19/22 0640  NA 130*   < > 132* 131* 131* 130* 131* 132*  --   --  132*  K 5.3*   < > 3.3* 3.7 3.8 3.7 4.0 3.4*  --   --  4.1  CL 92*  --  96* 93* 94* 96*  --  92*  --   --  95*  CO2 18*  --  '27 26 24 22  '$ --  25  --   --  24  GLUCOSE 104*  --  70 78 83 99  --  125*  --   --  113*  BUN 98*  --  52* 61* 64* 68*  --  36*  --   --  44*  CREATININE 10.05*  --  5.53* 6.56* 7.20* 8.27*  --  5.92*  --   --  7.04*  ALBUMIN 1.7*  --  1.6* <1.5* 1.5* <1.5*  --  1.8*  --   --  1.7*  CALCIUM 8.6*  --  7.8* 7.8* 7.9* 7.8*  --  8.3*  --   --  7.9*  PHOS  --   --   --  4.2  --   --   --   --  6.9* 6.7* 7.3*  AST 103*  --  117*  --  118* 90*  --  81*  --   --  66*  ALT 28  --  33  --  32 26  --  22  --   --  20   < > = values in this interval not displayed.   Liver Function Tests: Recent Labs  Lab 04/17/22 0405 04/18/22 0407 04/19/22 0640  AST 90* 81* 66*  ALT '26 22 20  '$ ALKPHOS 70 68 70  BILITOT 1.2 1.0 1.1  PROT 6.4*  6.9 6.9  ALBUMIN <1.5* 1.8* 1.7*   Recent Labs  Lab 04/14/22 1459  LIPASE 57*   Recent Labs  Lab 04/17/22 0405 04/18/22 0407 04/19/22 0640  AMMONIA 33 30 43*   CBC: Recent Labs  Lab 04/14/22 1323 04/14/22 1837 04/16/22 0341 04/16/22 1007 04/17/22 0405 04/17/22 0615 04/18/22 0407 04/19/22 0640  WBC 5.8   < > 4.5 4.4 4.4  --  2.9* 3.8*  NEUTROABS 4.3  --   --   --   --   --   --   --   HGB 5.7*   < > 7.9* 8.3* 8.0* 8.5* 7.6* 7.7*  HCT 17.4*   < > 23.4* 24.6* 24.1* 25.0* 22.2* 23.9*  MCV 102.4*   < > 95.9 95.7 96.4  --  97.4 98.4  PLT 123*   < > 98* 95* 90*  --  79* 70*   < > = values in this interval not displayed.   Cardiac Enzymes: No results for input(s): "CKTOTAL", "CKMB", "CKMBINDEX", "TROPONINI" in the last 168 hours. CBG: Recent Labs  Lab 04/18/22 1212 04/18/22 2000 04/18/22 2348 04/19/22 0345 04/19/22 0804  GLUCAP 127* 113* 114* 118* 122*    Iron Studies: No results for input(s): "IRON", "TIBC", "TRANSFERRIN", "FERRITIN" in the last 72 hours. Studies/Results: DG Abd Portable 1V  Result Date: 04/18/2022 CLINICAL DATA:  Feeding tube placement EXAM: PORTABLE ABDOMEN - 1 VIEW COMPARISON:  04/14/2022 FINDINGS: Feeding tube with the tip projecting over the stomach. Mild gaseous distension of the small bowel and colon. No bowel dilatation to suggest bowel obstruction. No evidence of pneumoperitoneum, portal venous gas or pneumatosis. No pathologic calcifications along the expected course of the ureters. No acute osseous abnormality. IMPRESSION: 1. Feeding tube with the tip projecting over the stomach. Electronically Signed   By: Kathreen Devoid M.D.   On: 04/18/2022 15:15    Chlorhexidine Gluconate Cloth  6 each Topical Q0600   feeding supplement (PROSource TF20)  60 mL Per Tube Daily   lactulose  10 g Per Tube TID   LORazepam  0-4 mg Intravenous Q4H   Followed by   LORazepam  0-4 mg Intravenous Q8H   multivitamin  1 tablet Per Tube QHS   pantoprazole  40 mg  Intravenous Q12H    BMET    Component Value Date/Time   NA 132 (L) 04/19/2022 0640   NA 138 08/22/2020 1141   K 4.1 04/19/2022 0640   CL 95 (L) 04/19/2022 0640   CO2 24 04/19/2022 0640   GLUCOSE 113 (H) 04/19/2022 0640   BUN 44 (H) 04/19/2022 0640   BUN 20 08/22/2020 1141   CREATININE 7.04 (H) 04/19/2022 0640   CREATININE 6.60 (HH) 02/06/2022 0943   CALCIUM 7.9 (L) 04/19/2022 0640   CALCIUM 7.9 (L) 08/01/2019 0702   GFRNONAA 8 (L) 04/19/2022 0640   GFRNONAA 9 (L) 02/06/2022 0943   GFRAA 7 (L) 12/28/2019 1640   CBC    Component Value Date/Time   WBC 3.8 (L) 04/19/2022 0640   RBC 2.43 (L) 04/19/2022 0640   HGB 7.7 (L) 04/19/2022 0640   HGB 9.0 (L) 10/03/2021 0900   HGB 8.4 (L) 08/22/2020 1141   HCT 23.9 (L) 04/19/2022 0640   HCT 25.0 (L) 08/22/2020 1141   PLT 70 (L) 04/19/2022 0640   PLT 142 (L) 10/03/2021 0900   PLT 184 08/22/2020 1141   MCV 98.4 04/19/2022 0640   MCV 94 08/22/2020 1141   MCH 31.7 04/19/2022 0640   MCHC 32.2 04/19/2022 0640   RDW 18.8 (H) 04/19/2022 0640   RDW 16.7 (H) 08/22/2020 1141   LYMPHSABS 0.9 04/14/2022 1323   LYMPHSABS 1.8 08/22/2020 1141   MONOABS 0.6 04/14/2022 1323   EOSABS 0.0 04/14/2022 1323   EOSABS 0.3 08/22/2020 1141   BASOSABS 0.0 04/14/2022 1323   BASOSABS 0.0 08/22/2020 1141    Dialysis Orders:  MWF [facility] 4hr, 400/A1.5, EDW 98.8kg, 2K/2Ca, AVF, no heparin - Hectoral 74mg IV q HD - Appears to get IV iron with oncology, no ESA d/t cancer   Assessment/Plan:  Acute hypoxic respiratory failure - following large volume emesis of blood and intubated in the  ED.  Self-extubated 04/17/22, plan per PCCM.  Also with ongoing aspiration and weak cough, high risk for re-intubation.  ABLA - acute on chronic with evidence of GI bleed.  Known AVM's.  Gi consulted and plan for blood transfusion. On octreotide and protonix.  Continue to transfuse prn.  Covid-19 Positive - without hypoxia but does have N/V/D.  Quarantine for 5 days per  protocol.  Treatment per primary svc  ESRD -  off schedule, and original plan was to get back on schedule Sunday (holiday schedule for MWF patients), however given wheezing will plan for HD today and get back on schedule next week.  Hypertension/volume  - stable  Anemia  - as above, for blood transfusion and follow.  Metabolic bone disease -  continue with meds  Nutrition - renal diet  Medical noncompliance - multiple admissions with ongoing issue  Alcohol abuse disorder - high risk for withdrawal, per primary  Hepatocellular carcinoma - s/p Y90 on 11/28/21.  Hyperkalemia - mild and due to noncompliance with HD and GIB.  Resolved with HD.  Disposition - poor overall prognosis with multiple admissions for the past several months.  Agree with palliative care consult to help set goals/limits of care.   Awaiting family meeting.   Donetta Potts, MD [facility]

## 2022-04-19 NOTE — Plan of Care (Signed)
  Problem: Safety: Goal: Non-violent Restraint(s) Outcome: Completed/Met   Problem: Education: Goal: Knowledge of General Education information will improve Description: Including pain rating scale, medication(s)/side effects and non-pharmacologic comfort measures Outcome: Not Progressing   Problem: Health Behavior/Discharge Planning: Goal: Ability to manage health-related needs will improve Outcome: Not Progressing   Problem: Clinical Measurements: Goal: Ability to maintain clinical measurements within normal limits will improve Outcome: Not Progressing Goal: Will remain free from infection Outcome: Not Progressing Goal: Diagnostic test results will improve Outcome: Not Progressing Goal: Respiratory complications will improve Outcome: Not Progressing Goal: Cardiovascular complication will be avoided Outcome: Not Progressing   Problem: Activity: Goal: Risk for activity intolerance will decrease Outcome: Not Progressing Note: R/t pt total care    Problem: Nutrition: Goal: Adequate nutrition will be maintained Outcome: Not Progressing Note: ON tube feeding not at goal at this time NPO   Problem: Coping: Goal: Level of anxiety will decrease Outcome: Not Progressing   Problem: Elimination: Goal: Will not experience complications related to bowel motility Outcome: Not Progressing Goal: Will not experience complications related to urinary retention Outcome: Not Progressing   Problem: Pain Managment: Goal: General experience of comfort will improve Outcome: Not Progressing Note: No signs pain noted just some general moaning with any activity or touch   Problem: Skin Integrity: Goal: Risk for impaired skin integrity will decrease Outcome: Not Progressing   Problem: Education: Goal: Knowledge of risk factors and measures for prevention of condition will improve Outcome: Not Progressing   Problem: Coping: Goal: Psychosocial and spiritual needs will be  supported Outcome: Not Progressing   Problem: Respiratory: Goal: Will maintain a patent airway Outcome: Not Progressing Goal: Complications related to the disease process, condition or treatment will be avoided or minimized Outcome: Not Progressing   Problem: Education: Goal: Knowledge of risk factors and measures for prevention of condition will improve Outcome: Not Progressing   Problem: Respiratory: Goal: Will maintain a patent airway Outcome: Progressing Note: Decrease on 02 needs Goal: Complications related to the disease process, condition or treatment will be avoided or minimized Outcome: Not Progressing

## 2022-04-19 NOTE — Progress Notes (Signed)
eLink Physician-Brief Progress Note Patient Name: KAIS MONJE DOB: October 16, 1960 MRN: [id number]   Date of Service  04/19/2022  HPI/Events of Note  Hypomagnesemia - Mg++ = 1.6 and Creatinine = 7.04.   eICU Interventions  Will replace Mg++.     Intervention Category Major Interventions: Electrolyte abnormality - evaluation and management  Metztli Sachdev Eugene 04/19/2022, 11:11 PM

## 2022-04-19 NOTE — Progress Notes (Signed)
Received patient in bed to unit.  Alert and oriented.  Informed consent signed and in chart.   Treatment initiated: 1029 Treatment completed: 1459  Patient tolerated well.  without acute distress.  Hand-off given to patient's nurse.   Access used: AVF Access issues: none  Total UF removed: 2.9 L Medication(s) given: none Post HD VS: 144/77 P 90 R 22 Post HD weight: 99 kg   [facility]

## 2022-04-19 NOTE — Progress Notes (Signed)
NAME:  Daniel [name], MRN:  [id number], DOB:  19-Apr-1961, LOS: 5 ADMISSION DATE:  04/14/2022, CONSULTATION DATE:  04/14/2022 REFERRING MD:  Dr. Avon Gully, CHIEF COMPLAINT:  UGIB    History of Present Illness:   [name], past medical history of peripheral arterial disease, dialysis patient Monday, end-stage renal disease, type 2 diabetes hepatocellular carcinoma status post radiation. seen in pulm clinic before for a lung nodule which on bronchoscopic eval / path was not c/w malignancy .  He has had GI bleeding in the past with 2 duodenal ulcers a AVM alcoholic cirrhosis with his known history hepatocellular carcinoma.  In the ER he was also found to be COVID-positive.  He ultimately had a large volume of bloody emesis in the emergency room he had respiratory failure and was intubated.  Pulmonary critical care was consulted for ICU was consulted for admission.  He had already been seen by hospitalist and admitted with GI consultation and IR but declined while he was in theEmergency department.   Pertinent Medical History   Past Medical History:  Diagnosis Date   Anemia    Diabetes mellitus without complication ([place])    patient denies   Dialysis patient [facility])    End stage chronic kidney disease ([place])    Hypertension    ICH (intracerebral hemorrhage) ([place]) 05/20/2017   PAD (peripheral artery disease) ([facility])    Shoulder pain, left 06/28/2013     Significant Hospital Events: Including procedures, antibiotic start and stop dates in addition to other pertinent events   12/17 covid + with generalized body sx, left ama 12/18 admitted with GIB. Intubated for hematemesis 12/19 starting on covid therapies   Interim History / Subjective:  Remains confused. More wheezing overnight and this morning, but has responded to nebs.  iHD today.  Objective   Blood pressure (!) 144/73, pulse 78, temperature 97.6 F (36.4 C), temperature source Oral, resp. rate 20, weight  103.4 kg, SpO2 92 %. CVP:  [10 mmHg-25 mmHg] 13 mmHg  FiO2 (%):  [40 %] 40 %   Intake/Output Summary (Last 24 hours) at 04/19/2022 0908 Last data filed at 04/19/2022 0700 Gross per 24 hour  Intake 947.76 ml  Output 30 ml  Net 917.76 ml    Filed Weights   04/17/22 0345 04/18/22 0500 04/19/22 0349  Weight: 100.3 kg 100 kg 103.4 kg   Examination: General:  chronically ill appearing man lying in bed confused, in NAD HEENT: [place]/AT, eyes aniceric.  Neuro: awake, not cooperative with exam, tries to move away from me during examination. Not answering questions or following commands CV: s1S2, RRR PULM:  seen after nebs-- no wheezing, no observed coughing. Faint rhales.  GI: soft, NT Extremities: left BKA, LUE fistula being used for iHD. No significant RLE edema.   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 PPI BID now. Unfortunately unable to get depo octreotide during this admission -long-term prognosis is poor -trial of  restarting DVT prophylaxis   Acute respiratory failure with hypoxia on mechanical ventilation Covid infection with pneumonia - remains on HHF -q4h duonebs -pulmonary hygiene; he has a very weak cough; can add NTS PRN  -remains high risk for reintubation due to severity of pneumonia and weak cough  - wean O2 as able -remdesivir; avoiding prednisone due to UGIB. - aspiration precautions; has NGT for meds -  complete 7 days of ceftriaxone -remain on precautions until he clinically improves or 14 days (21 if he remains critically ill) - volume management with iHD  ESRD on iHD, poorly compliant  Hyponatremia Hypokalemia  hyperphosphatemia - iHD per nephro-- today then 12/26 -strict I/O -renally dose meds -adding calcium for phos binder    Hypoglycemia - d/c d10w -TF  Thrombocytopenia-chronic due to cirrhosis. Likely acutely dropping 2/2 antibiotics or sepsis Anemia, acute on chronic.  Baseline hemoglobin 7.5-8. - monitor for bleeding -no acute indication for transfusion - Transfuse for hemoglobin less than 7 or hemodynamically significant bleeding  Severe protein energy malnutrition Alcohol abuse - CIWA -vitamins, TF. Watch for refeeding syndrome.   Encephalopathy, metabolic/ toxic, concern for ETOH withdrawal - con't supportive care -requires restraints to leave O2 in place -CIWA for ETOH w/d -increase lactulose with high ammonia -iHD for metabolic clearance  Poor prognosis long-term. Family coming tomorrow for meeting. Remains very high risk for reintubation.  Best Practice (right click and "Reselect all SmartList Selections" daily)   Diet/type: tubefeeds  DVT prophylaxis: prophylactic heparin  GI prophylaxis: PPI BID Lines: N/A Foley:  N/A Code Status:  full code Last date of multidisciplinary goals of care discussion [pending discussion 12/24]  Labs   CBC: Recent Labs  Lab 04/14/22 1323 04/14/22 1837 04/16/22 0341 04/16/22 1007 04/17/22 0405 04/17/22 0615 04/18/22 0407 04/19/22 0640  WBC 5.8   < > 4.5 4.4 4.4  --  2.9* 3.8*  NEUTROABS 4.3  --   --   --   --   --   --   --   HGB 5.7*   < > 7.9* 8.3* 8.0* 8.5* 7.6* 7.7*  HCT 17.4*   < > 23.4* 24.6* 24.1* 25.0* 22.2* 23.9*  MCV 102.4*   < > 95.9 95.7 96.4  --  97.4 98.4  PLT 123*   < > 98* 95* 90*  --  79* 70*   < > = values in this interval not displayed.     Basic Metabolic Panel: Recent Labs  Lab  04/15/22 1846 04/15/22 2022 04/16/22 0341 04/17/22 0405 04/17/22 0615 04/18/22 0407 04/18/22 1420 04/18/22 1841 04/19/22 0640  NA 131*  --  131* 130* 131* 132*  --   --  132*  K 3.7  --  3.8 3.7 4.0 3.4*  --   --  4.1  CL 93*  --  94* 96*  --  92*  --   --  95*  CO2 26  --  24 22  --  25  --   --  24  GLUCOSE 78  --  83 99  --  125*  --   --  113*  BUN 61*  --  64* 68*  --  36*  --   --  44*  CREATININE 6.56*  --  7.20* 8.27*  --  5.92*  --   --  7.04*  CALCIUM 7.8*  --  7.9* 7.8*  --  8.3*  --   --  7.9*  MG  --    < >  --  1.5*  --  1.9 1.9 1.9 1.9  PHOS 4.2  --   --   --   --   --  6.9* 6.7* 7.3*   < > = values in this interval not displayed.    GFR: Estimated Creatinine Clearance: 13.3 mL/min (A) (by C-G formula based on SCr of 7.04 mg/dL (H)). Recent Labs  Lab 04/14/22 1459 04/14/22 1837 04/15/22 0840 04/15/22 1052 04/15/22 1701 04/16/22 0341 04/16/22 1007 04/17/22 0405 04/18/22 0407 04/19/22 0640  PROCALCITON  --   --   --   --  2.07 2.49  --  2.07  --   --   WBC  --  6.5  --    < > 4.8 4.5 4.4 4.4 2.9* 3.8*  LATICACIDVEN 5.9* 4.0* 1.7  --   --   --   --   --   --   --    < > = values in this interval not displayed.     Liver Function Tests: Recent Labs  Lab 04/15/22 0840 04/15/22 1846 04/16/22 0341 04/17/22 0405 04/18/22 0407 04/19/22 0640  AST 117*  --  118* 90* 81* 66*  ALT 33  --  32 '26 22 20  '$ ALKPHOS 66  --  65 70 68 70  BILITOT 1.5*  --  1.2 1.2 1.0 1.1  PROT 6.6  --  6.3* 6.4* 6.9 6.9  ALBUMIN 1.6* <1.5* 1.5* <1.5* 1.8* 1.7*    Recent Labs  Lab 04/14/22 1459  LIPASE 57*    Recent Labs  Lab 04/14/22 1837 04/16/22 0341 04/17/22 0405 04/18/22 0407 04/19/22 0640  AMMONIA 45* 47* 33 30 43*      This patient is critically ill with multiple organ system failure which requires frequent high complexity decision making, assessment, support, evaluation, and titration of therapies. This was completed through the application of advanced  monitoring technologies and extensive interpretation of multiple databases. During this encounter critical care time was devoted to patient care services described in this note for 35 minutes.  Julian Hy, DO 04/19/22 5:30 PM [facility]  For contact information, see Amion. If no response to pager, please call PCCM consult pager. After hours, 7PM- 7AM, please call Elink.

## 2022-04-20 DIAGNOSIS — J9601 Acute respiratory failure with hypoxia: Secondary | ICD-10-CM | POA: Diagnosis not present

## 2022-04-20 DIAGNOSIS — E7220 Disorder of urea cycle metabolism, unspecified: Secondary | ICD-10-CM | POA: Diagnosis not present

## 2022-04-20 DIAGNOSIS — U071 COVID-19: Secondary | ICD-10-CM | POA: Diagnosis not present

## 2022-04-20 DIAGNOSIS — G9341 Metabolic encephalopathy: Secondary | ICD-10-CM | POA: Diagnosis not present

## 2022-04-20 LAB — CBC
HCT: 22.9 % — ABNORMAL LOW (ref 39.0–52.0)
Hemoglobin: 7.4 g/dL — ABNORMAL LOW (ref 13.0–17.0)
MCH: 32.5 pg (ref 26.0–34.0)
MCHC: 32.3 g/dL (ref 30.0–36.0)
MCV: 100.4 fL — ABNORMAL HIGH (ref 80.0–100.0)
Platelets: 63 10*3/uL — ABNORMAL LOW (ref 150–400)
RBC: 2.28 MIL/uL — ABNORMAL LOW (ref 4.22–5.81)
RDW: 18.7 % — ABNORMAL HIGH (ref 11.5–15.5)
WBC: 3.1 10*3/uL — ABNORMAL LOW (ref 4.0–10.5)
nRBC: 0 % (ref 0.0–0.2)

## 2022-04-20 LAB — PHOSPHORUS
Phosphorus: 4 mg/dL (ref 2.5–4.6)
Phosphorus: 3.4 mg/dL (ref 2.5–4.6)

## 2022-04-20 LAB — GLUCOSE, CAPILLARY
Glucose-Capillary: 133 mg/dL — ABNORMAL HIGH (ref 70–99)
Glucose-Capillary: 128 mg/dL — ABNORMAL HIGH (ref 70–99)
Glucose-Capillary: 137 mg/dL — ABNORMAL HIGH (ref 70–99)
Glucose-Capillary: 122 mg/dL — ABNORMAL HIGH (ref 70–99)
Glucose-Capillary: 123 mg/dL — ABNORMAL HIGH (ref 70–99)

## 2022-04-20 LAB — MAGNESIUM
Magnesium: 2.8 mg/dL — ABNORMAL HIGH (ref 1.7–2.4)
Magnesium: 2.1 mg/dL (ref 1.7–2.4)

## 2022-04-20 MED ORDER — IPRATROPIUM-ALBUTEROL 0.5-2.5 (3) MG/3ML IN SOLN
3.0000 mL | Freq: Three times a day (TID) | RESPIRATORY_TRACT | Status: DC
  Administered 2022-04-20 – 2022-04-21 (×4): 3 mL via RESPIRATORY_TRACT
  Filled 2022-04-20 (×4): qty 3

## 2022-04-20 MED ORDER — ARFORMOTEROL TARTRATE 15 MCG/2ML IN NEBU
15.0000 ug | INHALATION_SOLUTION | Freq: Two times a day (BID) | RESPIRATORY_TRACT | Status: DC
  Administered 2022-04-20 – 2022-05-05 (×27): 15 ug via RESPIRATORY_TRACT
  Filled 2022-04-20 (×30): qty 2

## 2022-04-20 MED ORDER — REVEFENACIN 175 MCG/3ML IN SOLN
175.0000 ug | Freq: Every day | RESPIRATORY_TRACT | Status: DC
  Administered 2022-04-20 – 2022-05-05 (×16): 175 ug via RESPIRATORY_TRACT
  Filled 2022-04-20 (×16): qty 3

## 2022-04-20 NOTE — Progress Notes (Signed)
eLink Physician-Brief Progress Note Patient Name: Daniel [name] DOB: Dec 25, 1960 MRN: [id number]   Date of Service  04/20/2022  HPI/Events of Note  Asking for restraints.  Camera evaluation done, VS stable. Sats ok.  As per hand off, he is at risk for re intubation, for now able to protect airways and awake. Confused. Trying to pull lines, oxygen.   eICU Interventions  -Bilateral, non violent soft wrist restriants ordered to prevent self injury and harm from pullineg lines/tubes.       Intervention Category Intermediate Interventions: Other: (for restraints)  Elmer Sow 04/20/2022, 9:30 PM

## 2022-04-20 NOTE — Progress Notes (Signed)
Patient ID: Daniel [name], male   DOB: Aug 29, 1960, 61 y.o.   MRN: [id number] S: No events overnight O:BP 113/85   Pulse 90   Temp 98.7 F (37.1 C) (Axillary)   Resp 18   Wt 97.6 kg   SpO2 97%   BMI 30.01 kg/m   Intake/Output Summary (Last 24 hours) at 04/20/2022 0955 Last data filed at 04/20/2022 0400 Gross per 24 hour  Intake 1355.58 ml  Output 2900 ml  Net -1544.42 ml   Intake/Output: I/O last 3 completed shifts: In: 1959.9 [I.V.:227.7; NG/GT:1332.3; IV Piggyback:399.9] Out: 2930 [Emesis/NG output:30; Other:2900]  Intake/Output this shift:  No intake/output data recorded. Weight change: -2.4 kg YYT:KPTWSFKCL and not following commands CVS: RRR Resp: scattered rhonchi and occ wheezes bilaterally Abd: +BS, soft, NT/ND Ext: no edema s/p L AKA, LUE AVF +T/B  Recent Labs  Lab 04/14/22 1459 04/14/22 1939 04/15/22 0840 04/15/22 1846 04/16/22 0341 04/17/22 0405 04/17/22 0615 04/18/22 0407 04/18/22 1420 04/18/22 1841 04/19/22 0640 04/19/22 1725 04/20/22 0632  NA 130*   < > 132* 131* 131* 130* 131* 132*  --   --  132*  --   --   K 5.3*   < > 3.3* 3.7 3.8 3.7 4.0 3.4*  --   --  4.1  --   --   CL 92*  --  96* 93* 94* 96*  --  92*  --   --  95*  --   --   CO2 18*  --  '27 26 24 22  '$ --  25  --   --  24  --   --   GLUCOSE 104*  --  70 78 83 99  --  125*  --   --  113*  --   --   BUN 98*  --  52* 61* 64* 68*  --  36*  --   --  44*  --   --   CREATININE 10.05*  --  5.53* 6.56* 7.20* 8.27*  --  5.92*  --   --  7.04*  --   --   ALBUMIN 1.7*  --  1.6* <1.5* 1.5* <1.5*  --  1.8*  --   --  1.7*  --   --   CALCIUM 8.6*  --  7.8* 7.8* 7.9* 7.8*  --  8.3*  --   --  7.9*  --   --   PHOS  --   --   --  4.2  --   --   --   --  6.9* 6.7* 7.3* 3.7 4.0  AST 103*  --  117*  --  118* 90*  --  81*  --   --  66*  --   --   ALT 28  --  33  --  32 26  --  22  --   --  20  --   --    < > = values in this interval not displayed.   Liver Function Tests: Recent Labs  Lab 04/17/22 0405  04/18/22 0407 04/19/22 0640  AST 90* 81* 66*  ALT '26 22 20  '$ ALKPHOS 70 68 70  BILITOT 1.2 1.0 1.1  PROT 6.4* 6.9 6.9  ALBUMIN <1.5* 1.8* 1.7*   Recent Labs  Lab 04/14/22 1459  LIPASE 57*   Recent Labs  Lab 04/17/22 0405 04/18/22 0407 04/19/22 0640  AMMONIA 33 30 43*   CBC: Recent Labs  Lab 04/14/22 1323 04/14/22 1837 04/16/22 1007  04/17/22 0405 04/17/22 0615 04/18/22 0407 04/19/22 0640 04/20/22 0632  WBC 5.8   < > 4.4 4.4  --  2.9* 3.8* 3.1*  NEUTROABS 4.3  --   --   --   --   --   --   --   HGB 5.7*   < > 8.3* 8.0*   < > 7.6* 7.7* 7.4*  HCT 17.4*   < > 24.6* 24.1*   < > 22.2* 23.9* 22.9*  MCV 102.4*   < > 95.7 96.4  --  97.4 98.4 100.4*  PLT 123*   < > 95* 90*  --  79* 70* 63*   < > = values in this interval not displayed.   Cardiac Enzymes: No results for input(s): "CKTOTAL", "CKMB", "CKMBINDEX", "TROPONINI" in the last 168 hours. CBG: Recent Labs  Lab 04/19/22 1531 04/19/22 2147 04/19/22 2345 04/20/22 0351 04/20/22 0726  GLUCAP 132* 108* 115* 133* 128*    Iron Studies: No results for input(s): "IRON", "TIBC", "TRANSFERRIN", "FERRITIN" in the last 72 hours. Studies/Results: DG Chest Port 1 View  Result Date: 04/19/2022 CLINICAL DATA:  Respiratory failure.  COVID positive. EXAM: PORTABLE CHEST 1 VIEW COMPARISON:  04/14/2022 FINDINGS: Interval extubation. Right IJ central line is no longer evident. A feeding tube passes into the stomach although the distal tip position is not included on the film. Bilateral asymmetric airspace disease is similar to prior with confluent disease in the left mid lung and right base. The cardio pericardial silhouette is enlarged. Similar bibasilar collapse/consolidation with small bilateral pleural effusions. The cardio pericardial silhouette is enlarged. Telemetry leads overlie the chest. IMPRESSION: 1. Interval extubation and removal of right IJ central line. 2. Similar appearance of bilateral asymmetric airspace disease with  small bilateral pleural effusions. Electronically Signed   By: Misty Stanley M.D.   On: 04/19/2022 10:51   DG Abd Portable 1V  Result Date: 04/18/2022 CLINICAL DATA:  Feeding tube placement EXAM: PORTABLE ABDOMEN - 1 VIEW COMPARISON:  04/14/2022 FINDINGS: Feeding tube with the tip projecting over the stomach. Mild gaseous distension of the small bowel and colon. No bowel dilatation to suggest bowel obstruction. No evidence of pneumoperitoneum, portal venous gas or pneumatosis. No pathologic calcifications along the expected course of the ureters. No acute osseous abnormality. IMPRESSION: 1. Feeding tube with the tip projecting over the stomach. Electronically Signed   By: Kathreen Devoid M.D.   On: 04/18/2022 15:15    calcium carbonate (dosed in mg elemental calcium)  500 mg of elemental calcium Per Tube TID   Chlorhexidine Gluconate Cloth  6 each Topical Q0600   feeding supplement (PROSource TF20)  60 mL Per Tube Daily   heparin injection (subcutaneous)  5,000 Units Subcutaneous Q8H   ipratropium-albuterol  3 mL Nebulization Q4H   lactulose  10 g Per Tube QID   LORazepam  0-4 mg Intravenous Q8H   multivitamin  1 tablet Per Tube QHS   mouth rinse  15 mL Mouth Rinse 4 times per day   pantoprazole  40 mg Intravenous Q12H    BMET    Component Value Date/Time   NA 132 (L) 04/19/2022 0640   NA 138 08/22/2020 1141   K 4.1 04/19/2022 0640   CL 95 (L) 04/19/2022 0640   CO2 24 04/19/2022 0640   GLUCOSE 113 (H) 04/19/2022 0640   BUN 44 (H) 04/19/2022 0640   BUN 20 08/22/2020 1141   CREATININE 7.04 (H) 04/19/2022 0640   CREATININE 6.60 ([place]) 02/06/2022 2633  CALCIUM 7.9 (L) 04/19/2022 0640   CALCIUM 7.9 (L) 08/01/2019 0702   GFRNONAA 8 (L) 04/19/2022 0640   GFRNONAA 9 (L) 02/06/2022 0943   GFRAA 7 (L) 12/28/2019 1640   CBC    Component Value Date/Time   WBC 3.1 (L) 04/20/2022 0632   RBC 2.28 (L) 04/20/2022 0632   HGB 7.4 (L) 04/20/2022 0632   HGB 9.0 (L) 10/03/2021 0900   HGB 8.4 (L)  08/22/2020 1141   HCT 22.9 (L) 04/20/2022 0632   HCT 25.0 (L) 08/22/2020 1141   PLT 63 (L) 04/20/2022 0632   PLT 142 (L) 10/03/2021 0900   PLT 184 08/22/2020 1141   MCV 100.4 (H) 04/20/2022 0632   MCV 94 08/22/2020 1141   MCH 32.5 04/20/2022 0632   MCHC 32.3 04/20/2022 0632   RDW 18.7 (H) 04/20/2022 0632   RDW 16.7 (H) 08/22/2020 1141   LYMPHSABS 0.9 04/14/2022 1323   LYMPHSABS 1.8 08/22/2020 1141   MONOABS 0.6 04/14/2022 1323   EOSABS 0.0 04/14/2022 1323   EOSABS 0.3 08/22/2020 1141   BASOSABS 0.0 04/14/2022 1323   BASOSABS 0.0 08/22/2020 1141    Dialysis Orders:  MWF [facility] 4hr, 400/A1.5, EDW 98.8kg, 2K/2Ca, AVF, no heparin - Hectoral 35mg IV q HD - Appears to get IV iron with oncology, no ESA d/t cancer   Assessment/Plan:  Acute hypoxic respiratory failure - following large volume emesis of blood and intubated in the ED.  Self-extubated 04/17/22, plan per PCCM.  Also with ongoing aspiration and weak cough, high risk for re-intubation.  ABLA - acute on chronic with evidence of GI bleed.  Known AVM's.  Gi consulted and plan for blood transfusion. On octreotide and protonix.  Continue to transfuse prn.  Covid-19 Positive - without hypoxia but does have N/V/D.  Quarantine for 5 days per protocol.  Treatment per primary svc  ESRD -  off schedule, and original plan was to get back on schedule Sunday (holiday schedule for MWF patients), however given wheezing, he had HD yesterday.  Will plan for HD on Tuesday and then get back on schedule next week.  Hypertension/volume  - stable  Anemia  - as above, for blood transfusion and follow.  Metabolic bone disease -  continue with meds  Nutrition - renal diet  Medical noncompliance - multiple admissions with ongoing issue  Alcohol abuse disorder - high risk for withdrawal, per primary  Hepatocellular carcinoma - s/p Y90 on 11/28/21.  Hyperkalemia - mild and due to noncompliance with HD and GIB.  Resolved with HD.  Disposition - poor overall  prognosis with multiple admissions for the past several months.  Agree with palliative care consult to help set goals/limits of care.   Awaiting family meeting.   JDonetta Potts MD [facility]

## 2022-04-20 NOTE — Progress Notes (Signed)
NAME:  Daniel [name], MRN:  [id number], DOB:  1961/04/16, LOS: 6 ADMISSION DATE:  04/14/2022, CONSULTATION DATE:  04/14/2022 REFERRING MD:  Dr. Avon [name], CHIEF COMPLAINT:  UGIB    History of Present Illness:   [name], past medical history of peripheral arterial disease, dialysis patient Monday, end-stage renal disease, type 2 diabetes hepatocellular carcinoma status post radiation. seen in pulm clinic before for a lung nodule which on bronchoscopic eval / path was not c/w malignancy .  He has had GI bleeding in the past with 2 duodenal ulcers a AVM alcoholic cirrhosis with his known history hepatocellular carcinoma.  In the ER he was also found to be COVID-positive.  He ultimately had a large volume of bloody emesis in the emergency room he had respiratory failure and was intubated.  Pulmonary critical care was consulted for ICU was consulted for admission.  He had already been seen by hospitalist and admitted with GI consultation and IR but declined while he was in theEmergency department.   Pertinent Medical History   Past Medical History:  Diagnosis Date   Anemia    Diabetes mellitus without complication ([place])    patient denies   Dialysis patient [facility])    End stage chronic kidney disease ([place])    Hypertension    ICH (intracerebral hemorrhage) ([place]) 05/20/2017   PAD (peripheral artery disease) ([facility])    Shoulder pain, left 06/28/2013     Significant Hospital Events: Including procedures, antibiotic start and stop dates in addition to other pertinent events   12/17 covid + with generalized body sx, left ama 12/18 admitted with GIB. Intubated for hematemesis 12/19 starting on covid therapies  12/21 extubated 12/23 HD  Interim History / Subjective:  Still having periodic wheezing.  Remains confused.  Objective   Blood pressure 123/75, pulse 94, temperature 98.7 F (37.1 C), temperature source Axillary, resp. rate (!) 25, weight 97.6 kg, SpO2 97  %.    FiO2 (%):  [38 %-40 %] 38 %   Intake/Output Summary (Last 24 hours) at 04/20/2022 1143 Last data filed at 04/20/2022 1000 Gross per 24 hour  Intake 1611.5 ml  Output 2900 ml  Net -1288.5 ml    Filed Weights   04/19/22 1134 04/19/22 1453 04/20/22 0400  Weight: 101 kg 99.3 kg 97.6 kg   Examination: General: [name], wakes up confused HEENT: [place]/AT, eyes anicteric Neuro: Arouses to stimulation.  Mumbles, but not answering questions or following commands.  Moving all extremities spontaneously CV: S1-S2, regular rate and rhythm PULM: Expiratory wheezing, tachypnea but no accessory muscle use.  Frequent coughing-getting stronger GI: Soft, nontender, nondistended Extremities: Left lower extremity BKA, left upper extremity fistula with thrill no significant edema.  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 PPI BID now. Unfortunately unable to get depo octreotide during this admission -long-term prognosis is poor -trial of restarting DVT prophylaxis   Acute respiratory failure with hypoxia on mechanical ventilation Covid infection with pneumonia -Continues to have high oxygen requirements, continue heated high flow - Add  revefenacin and Brovana nebs; can space out DuoNebs to every 8 hours - Continue pulmonary hygiene.  Thankfully his cough is getting stronger. - Still remains high risk for reintubation with confusion, high oxygen requirement, weak cough. -Following management with dialysis - Wean O2 as  able - Completed 3 days of remdesivir, 12/19 - 12/21 - Avoiding steroids due to GI bleed - Complete 7 days of ceftriaxone - Remains on COVID precautions and until he clinically improves or 14 days (21 if he remains critically ill)  ESRD on iHD, poorly compliant as an outpatient Hyponatremia Hypokalemia  hyperphosphatemia -Intermittent hemodialysis per nephrology.  Last on 12/23, 12/26. - Strict I's/O - Renally dose meds and avoid nephrotoxic meds - Continue calcium his Phos binder.  Thrombocytopenia-chronic due to cirrhosis. Likely acutely dropping 2/2 antibiotics or sepsis Anemia, acute on chronic.  Baseline hemoglobin 7.5-8. - Monitor for signs of bleeding - No current indication for transfusion.  Transfuse for hemoglobin less than 7 or hemodynamically significant bleeding. -Daily CBC  Severe protein energy malnutrition Alcohol abuse -CIWA - Vitamins and tube feeds.  High risk for refeeding syndrome. - Continue cortrack.  Acute metabolic encephalopathy,  concern for ETOH withdrawal -Continue supportive care.  Not on scheduled meds to control delirium or induce sedation - CIWA - Lactulose 4 times daily - Dialysis for metabolic clearance.  Poor prognosis long-term. Family coming today at some point for a meeting. Remains very high risk for reintubation.  Best Practice (right click and "Reselect all SmartList Selections" daily)   Diet/type: tubefeeds  DVT prophylaxis: prophylactic heparin  GI prophylaxis: PPI BID Lines: N/A Foley:  N/A Code Status:  full code Last date of multidisciplinary goals of care discussion [pending discussion 12/24]  Labs   CBC: Recent Labs  Lab 04/14/22 1323 04/14/22 1837 04/16/22 1007 04/17/22 0405 04/17/22 0615 04/18/22 0407 04/19/22 0640 04/20/22 0632  WBC 5.8   < > 4.4 4.4  --  2.9* 3.8* 3.1*  NEUTROABS 4.3  --   --   --   --   --   --   --   HGB 5.7*   < > 8.3* 8.0* 8.5* 7.6* 7.7* 7.4*  HCT 17.4*   < > 24.6* 24.1* 25.0* 22.2*  23.9* 22.9*  MCV 102.4*   < > 95.7 96.4  --  97.4 98.4 100.4*  PLT 123*   < > 95* 90*  --  79* 70* 63*   < > = values in this interval not displayed.     Basic Metabolic Panel: Recent Labs  Lab 04/15/22 1846 04/15/22 2022 04/16/22 0341 04/17/22 0405 04/17/22 0615 04/18/22 0407 04/18/22 1420 04/18/22 1841 04/19/22 0640 04/19/22 1725 04/20/22 0632  NA 131*  --  131* 130* 131* 132*  --   --  132*  --   --   K 3.7  --  3.8 3.7 4.0 3.4*  --   --  4.1  --   --   CL 93*  --  94* 96*  --  92*  --   --  95*  --   --   CO2 26  --  24 22  --  25  --   --  24  --   --   GLUCOSE 78  --  83 99  --  125*  --   --  113*  --   --   BUN 61*  --  64* 68*  --  36*  --   --  44*  --   --  CREATININE 6.56*  --  7.20* 8.27*  --  5.92*  --   --  7.04*  --   --   CALCIUM 7.8*  --  7.9* 7.8*  --  8.3*  --   --  7.9*  --   --   MG  --    < >  --  1.5*  --  1.9 1.9 1.9 1.9 1.6* 2.8*  PHOS 4.2  --   --   --   --   --  6.9* 6.7* 7.3* 3.7 4.0   < > = values in this interval not displayed.    GFR: Estimated Creatinine Clearance: 13 mL/min (A) (by C-G formula based on SCr of 7.04 mg/dL (H)). Recent Labs  Lab 04/14/22 1459 04/14/22 1837 04/15/22 0840 04/15/22 1052 04/15/22 1701 04/16/22 0341 04/16/22 1007 04/17/22 0405 04/18/22 0407 04/19/22 0640 04/20/22 0632  PROCALCITON  --   --   --   --  2.07 2.49  --  2.07  --  1.27  --   WBC  --  6.5  --    < > 4.8 4.5   < > 4.4 2.9* 3.8* 3.1*  LATICACIDVEN 5.9* 4.0* 1.7  --   --   --   --   --   --   --   --    < > = values in this interval not displayed.     Liver Function Tests: Recent Labs  Lab 04/15/22 0840 04/15/22 1846 04/16/22 0341 04/17/22 0405 04/18/22 0407 04/19/22 0640  AST 117*  --  118* 90* 81* 66*  ALT 33  --  32 '26 22 20  '$ ALKPHOS 66  --  65 70 68 70  BILITOT 1.5*  --  1.2 1.2 1.0 1.1  PROT 6.6  --  6.3* 6.4* 6.9 6.9  ALBUMIN 1.6* <1.5* 1.5* <1.5* 1.8* 1.7*    Recent Labs  Lab 04/14/22 1459  LIPASE 57*    Recent  Labs  Lab 04/14/22 1837 04/16/22 0341 04/17/22 0405 04/18/22 0407 04/19/22 0640  AMMONIA 45* 47* 33 30 43*      This patient is critically ill with multiple organ system failure which requires frequent high complexity decision making, assessment, support, evaluation, and titration of therapies. This was completed through the application of advanced monitoring technologies and extensive interpretation of multiple databases. During this encounter critical care time was devoted to patient care services described in this note for 33 minutes.  Julian Hy, DO 04/20/22 12:33 PM [facility]  For contact information, see Amion. If no response to pager, please call PCCM consult pager. After hours, 7PM- 7AM, please call Elink.

## 2022-04-20 NOTE — IPAL (Signed)
  Interdisciplinary Goals of Care Family Meeting   Date carried out:: 04/20/2022  Location of the meeting: Conference room  Member's involved: Physician, Bedside Registered Nurse, and Family Member or next of kin  Durable Power of Attorney or acting medical decision maker: brother Josephina Gip and sister [name]    Discussion: We discussed goals of care for [employer] .  We discussed Finlay's care. He has been in and out of the hospital frequently and has resisted his sibling's attempts to help him. They have encouraged him to quit drinking and to take better care of himself. His 2 adult children are now estranged from him. They want what is best for him but understand that he is very ill and may not survive this hospital stay. They understand that if he progresses to recurrent respiratory failure requiring intubation or if his heart stops, he is not likely to survive at this point. If those things happen, they want to keep him comfortable and allow him to pass away naturally. Code status changed to DNR. We will keep them updated on his ongoing care, and I have invited them to please let us know if at any point they feel we are offering care that is not consistent with his wishes.  Code status: Full DNR  Disposition: Continue current acute care   Time spent for the meeting: 15 min.  Julian Hy 04/20/2022, 3:27 PM

## 2022-04-21 DIAGNOSIS — J9601 Acute respiratory failure with hypoxia: Secondary | ICD-10-CM | POA: Diagnosis not present

## 2022-04-21 DIAGNOSIS — U071 COVID-19: Secondary | ICD-10-CM | POA: Diagnosis not present

## 2022-04-21 DIAGNOSIS — G9341 Metabolic encephalopathy: Secondary | ICD-10-CM | POA: Diagnosis not present

## 2022-04-21 DIAGNOSIS — D62 Acute posthemorrhagic anemia: Secondary | ICD-10-CM | POA: Diagnosis not present

## 2022-04-21 LAB — BASIC METABOLIC PANEL
Anion gap: 12 (ref 5–15)
BUN: 37 mg/dL — ABNORMAL HIGH (ref 8–23)
CO2: 23 mmol/L (ref 22–32)
Calcium: 8.6 mg/dL — ABNORMAL LOW (ref 8.9–10.3)
Chloride: 101 mmol/L (ref 98–111)
Creatinine, Ser: 6.53 mg/dL — ABNORMAL HIGH (ref 0.61–1.24)
GFR, Estimated: 9 mL/min — ABNORMAL LOW (ref >60)
Glucose, Bld: 123 mg/dL — ABNORMAL HIGH (ref 70–99)
Potassium: 4.1 mmol/L (ref 3.5–5.1)
Sodium: 136 mmol/L (ref 135–145)

## 2022-04-21 LAB — MAGNESIUM: Magnesium: 2.2 mg/dL (ref 1.7–2.4)

## 2022-04-21 LAB — GLUCOSE, CAPILLARY
Glucose-Capillary: 133 mg/dL — ABNORMAL HIGH (ref 70–99)
Glucose-Capillary: 134 mg/dL — ABNORMAL HIGH (ref 70–99)
Glucose-Capillary: 146 mg/dL — ABNORMAL HIGH (ref 70–99)
Glucose-Capillary: 157 mg/dL — ABNORMAL HIGH (ref 70–99)

## 2022-04-21 LAB — CBC
HCT: 22.8 % — ABNORMAL LOW (ref 39.0–52.0)
Hemoglobin: 7.3 g/dL — ABNORMAL LOW (ref 13.0–17.0)
MCH: 32.3 pg (ref 26.0–34.0)
MCHC: 32 g/dL (ref 30.0–36.0)
MCV: 100.9 fL — ABNORMAL HIGH (ref 80.0–100.0)
Platelets: 68 10*3/uL — ABNORMAL LOW (ref 150–400)
RBC: 2.26 MIL/uL — ABNORMAL LOW (ref 4.22–5.81)
RDW: 18.4 % — ABNORMAL HIGH (ref 11.5–15.5)
WBC: 4.1 10*3/uL (ref 4.0–10.5)
nRBC: 0 % (ref 0.0–0.2)

## 2022-04-21 LAB — PHOSPHORUS: Phosphorus: 3.5 mg/dL (ref 2.5–4.6)

## 2022-04-21 MED ORDER — AMLODIPINE 1 MG/ML ORAL SUSPENSION: 5.0000 mg | Freq: Every day | ORAL | Status: DC

## 2022-04-21 MED ORDER — AMLODIPINE BESYLATE 5 MG PO TABS
5.0000 mg | ORAL_TABLET | Freq: Every day | ORAL | Status: DC
  Administered 2022-04-21 – 2022-04-29 (×9): 5 mg
  Filled 2022-04-21 (×9): qty 1

## 2022-04-21 MED ORDER — ALBUTEROL SULFATE (2.5 MG/3ML) 0.083% IN NEBU
2.5000 mg | INHALATION_SOLUTION | RESPIRATORY_TRACT | Status: DC | PRN
  Administered 2022-04-24 – 2022-04-25 (×2): 2.5 mg via RESPIRATORY_TRACT
  Filled 2022-04-21 (×2): qty 3

## 2022-04-21 MED ORDER — CHLORDIAZEPOXIDE HCL 25 MG PO CAPS
25.0000 mg | ORAL_CAPSULE | Freq: Three times a day (TID) | ORAL | Status: DC
  Administered 2022-04-21 – 2022-04-24 (×8): 25 mg via ORAL
  Filled 2022-04-21 (×8): qty 1

## 2022-04-21 MED ORDER — IPRATROPIUM-ALBUTEROL 0.5-2.5 (3) MG/3ML IN SOLN
3.0000 mL | Freq: Four times a day (QID) | RESPIRATORY_TRACT | Status: DC
  Administered 2022-04-22 – 2022-04-23 (×8): 3 mL via RESPIRATORY_TRACT
  Filled 2022-04-21 (×9): qty 3

## 2022-04-21 MED ORDER — HYDRALAZINE HCL 20 MG/ML IJ SOLN
10.0000 mg | Freq: Four times a day (QID) | INTRAMUSCULAR | Status: DC | PRN
  Administered 2022-04-21 – 2022-04-25 (×2): 10 mg via INTRAVENOUS
  Filled 2022-04-21 (×4): qty 1

## 2022-04-21 MED ORDER — LORAZEPAM 2 MG/ML IJ SOLN
0.0000 mg | Freq: Three times a day (TID) | INTRAMUSCULAR | Status: DC
  Administered 2022-04-21: 2 mg via INTRAVENOUS
  Filled 2022-04-21: qty 1

## 2022-04-21 NOTE — Progress Notes (Signed)
Patient ID: Daniel [name], male   DOB: 04-26-1961, 61 y.o.   MRN: [id number] S: Pt noted to be wheezing this am and was given nebulizers. O:BP (!) 153/81   Pulse 90   Temp 99 F (37.2 C) (Axillary)   Resp (!) 22   Wt 97.6 kg   SpO2 99%   BMI 30.01 kg/m   Exam: Gen: lethargic, coughing CVS:RRR Resp: bilateral wheezing, poor inspiratory effort Abd: +BS, soft, nt/ND Ext:S/P LAKA, 1+ ankle edema of RLE LUE AVF +T/B  OP HD: MWF [place] 4h  400/A1.5    98.8kg   2K/2Ca bat   LUE AVF  Heparin none - Hectoral 59mg IV q HD - Appears to get IV iron with oncology, no ESA d/t cancer   Assessment/Plan:  Acute hypoxic respiratory failure - following large volume emesis of blood and intubated in the ED.  Self-extubated 04/17/22, plan per PCCM.  Also with ongoing aspiration and weak cough.  ABLA - acute on chronic with evidence of GI bleed.  Known AVM's.  Gi consulted and plan for blood transfusion. On octreotide and protonix.  Transfuse prn.  Covid-19 Positive - without hypoxia.  Quarantine for 5 days per protocol.  per pmd  ESRD - usual HD MWF. Had HD here x 3, last 12/23 off schedule. Next HD 12/26.  Hypertension/volume  - stable  Anemia  - as above, for blood transfusion and follow.  Metabolic bone disease -  continue with meds  Nutrition - renal diet  Medical noncompliance - multiple admissions with ongoing issue  Alcohol abuse disorder - high risk for withdrawal, per primary  Hepatocellular carcinoma - s/p Y90 on 11/28/21.  Hyperkalemia - resolved with HD.  Disposition - poor overall prognosis with multiple admissions for the past several months.  Agree with palliative care consult to help set goals/limits of care.   Awaiting family meeting.   RKelly Splinter MD 04/21/2022, 12:19 PM  Recent Labs  Lab 04/18/22 0407 04/18/22 1420 04/19/22 0640 04/19/22 1725 04/20/22 0632 04/20/22 1801 04/21/22 0658  HGB 7.6*  --  7.7*  --  7.4*  --  7.3*  ALBUMIN 1.8*  --  1.7*  --   --   --   --    CALCIUM 8.3*  --  7.9*  --   --   --  8.6*  PHOS  --    < > 7.3*   < > 4.0 3.4 3.5  CREATININE 5.92*  --  7.04*  --   --   --  6.53*  K 3.4*  --  4.1  --   --   --  4.1   < > = values in this interval not displayed.    Inpatient medications:  arformoterol  15 mcg Nebulization BID   calcium carbonate (dosed in mg elemental calcium)  500 mg of elemental calcium Per Tube TID   Chlorhexidine Gluconate Cloth  6 each Topical Q0600   feeding supplement (PROSource TF20)  60 mL Per Tube Daily   heparin injection (subcutaneous)  5,000 Units Subcutaneous Q8H   ipratropium-albuterol  3 mL Nebulization Q8H   lactulose  10 g Per Tube QID   LORazepam  0-4 mg Intravenous Q8H   multivitamin  1 tablet Per Tube QHS   mouth rinse  15 mL Mouth Rinse 4 times per day   pantoprazole  40 mg Intravenous Q12H   revefenacin  175 mcg Nebulization Daily    anticoagulant sodium citrate     cefTRIAXone (ROCEPHIN)  IV Stopped (04/20/22 1344)   feeding supplement (VITAL 1.5 CAL) 65 mL/hr at 29/51/88 4166   folic acid 1 mg in sodium chloride 0.9 % 50 mL IVPB 1 mg (04/21/22 0931)   thiamine (VITAMIN B1) injection 100 mL/hr at 04/21/22 0700   acetaminophen **OR** acetaminophen, alteplase, anticoagulant sodium citrate, docusate sodium, heparin, lidocaine (PF), lidocaine-prilocaine, ondansetron **OR** ondansetron (ZOFRAN) IV, mouth rinse, pentafluoroprop-tetrafluoroeth, polyethylene glycol

## 2022-04-21 NOTE — Progress Notes (Signed)
NAME:  Daniel [name], MRN:  [id number], DOB:  October 03, 1960, LOS: 7 ADMISSION DATE:  04/14/2022, CONSULTATION DATE:  04/14/2022 REFERRING MD:  Dr. Avon Gully, CHIEF COMPLAINT:  UGIB    History of Present Illness:   [name], past medical history of peripheral arterial disease, dialysis patient Monday, end-stage renal disease, type 2 diabetes hepatocellular carcinoma status post radiation. seen in pulm clinic before for a lung nodule which on bronchoscopic eval / path was not c/w malignancy .  He has had GI bleeding in the past with 2 duodenal ulcers a AVM alcoholic cirrhosis with his known history hepatocellular carcinoma.  In the ER he was also found to be COVID-positive.  He ultimately had a large volume of bloody emesis in the emergency room he had respiratory failure and was intubated.  Pulmonary critical care was consulted for ICU was consulted for admission.  He had already been seen by hospitalist and admitted with GI consultation and IR but declined while he was in theEmergency department.   Pertinent Medical History   Past Medical History:  Diagnosis Date   Anemia    Diabetes mellitus without complication ([place])    patient denies   Dialysis patient [facility])    End stage chronic kidney disease ([place])    Hypertension    ICH (intracerebral hemorrhage) ([place]) 05/20/2017   PAD (peripheral artery disease) ([facility])    Shoulder pain, left 06/28/2013     Significant Hospital Events: Including procedures, antibiotic start and stop dates in addition to other pertinent events   12/17 covid + with generalized body sx, left ama 12/18 admitted with GIB. Intubated for hematemesis 12/19 starting on covid therapies  12/21 extubated 12/23 HD  Interim History / Subjective:  Remains on high flow oxygen.  More aggressive behavior today  Objective   Blood pressure (!) 163/80, pulse 98, temperature 99 F (37.2 C), temperature source Axillary, resp. rate (!) 21, height '5\' 11"'$   (1.803 m), weight 97.6 kg, SpO2 94 %.    FiO2 (%):  [40 %-50 %] 40 %   Intake/Output Summary (Last 24 hours) at 04/21/2022 1851 Last data filed at 04/21/2022 1800 Gross per 24 hour  Intake 1825.25 ml  Output 325 ml  Net 1500.25 ml    Filed Weights   04/19/22 1134 04/19/22 1453 04/20/22 0400  Weight: 101 kg 99.3 kg 97.6 kg   Examination: General: Appearing man lying in bed watching TV HEENT: [place]/AT, eyes anicteric Neuro: Fights replacing nasal cannula, aggressive language.  Speech less garbled today, overall more interactive.  Moving his extremities. 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  Medically treated with octreotide and pantoprazole infusion.  Continue PPI twice daily.  Monitor for ongoing bleeding. - Unable to get Depo octreotide prior to discharge unfortunately. - Long-term prognosis remains poor   Acute respiratory failure with hypoxia on mechanical ventilation Covid infection with pneumonia -Weaning heated high flow - Continue Brovana and Yupelri nebulizers.  DuoNebs every 8 hours - Pulmonary hygiene -  Continue to monitor in the ICU today.  Hopefully can transfer out tomorrow if respiratory status is improving. - Completed 3 days of remdesivir. -Try to avoid steroids due to GI bleed - Completed 7 days of ceftriaxone previously - COVID precautions until 14 days are clinically improved; would require 21 days if he  remains critically ill.  ESRD on iHD, poorly compliant as an outpatient Hyponatremia Hypokalemia  hyperphosphatemia -Intermittent hemodialysis per nephrology.  Planning for tomorrow. - Strict I/os-renally dose meds and avoid nephrotoxic meds - Calcium to bind phosphorus  Thrombocytopenia-chronic due to cirrhosis. Likely acutely dropping 2/2 antibiotics or sepsis Anemia, acute on chronic.  Baseline hemoglobin 7.5-8. - Monitor for signs of bleeding - Transfuse for hemoglobin less than 7 or hemodynamically significant bleeding. - CBC periodically  Hypertension - Add hydralazine as needed for SBP sustained greater than 160. - Resume PTA amlodipine  Severe protein energy malnutrition Alcohol abuse -Continue CIWA - Continue vitamins and tube feeds - Continue core track until able to swallow on his own  Acute metabolic encephalopathy,  concern for ETOH withdrawal -Continue supportive care.  Can add Seroquel as needed - Continue CIWA - Continue lactulose 4 times daily -HD for metabolic clearance  Long-term prognosis remains poor  Best Practice (right click and "Reselect all SmartList Selections" daily)   Diet/type: tubefeeds  DVT prophylaxis: prophylactic heparin  GI prophylaxis: PPI BID Lines: N/A Foley:  N/A Code Status:  full code Last date of multidisciplinary goals of care discussion [pending discussion 12/24]  Labs   CBC: Recent Labs  Lab 04/17/22 0405 04/17/22 0615 04/18/22 0407 04/19/22 0640 04/20/22 0632 04/21/22 0658  WBC 4.4  --  2.9* 3.8* 3.1* 4.1  HGB 8.0* 8.5* 7.6* 7.7* 7.4* 7.3*  HCT 24.1* 25.0* 22.2* 23.9* 22.9* 22.8*  MCV 96.4  --  97.4 98.4 100.4* 100.9*  PLT 90*  --  79* 70* 63* 68*     Basic Metabolic Panel: Recent Labs  Lab 04/16/22 0341 04/17/22 0405 04/17/22 0615 04/18/22 0407 04/18/22 1420 04/19/22 0640 04/19/22 1725 04/20/22 0632 04/20/22 1801 04/21/22 0658  NA 131* 130* 131* 132*  --  132*  --   --   --  136  K 3.8 3.7 4.0  3.4*  --  4.1  --   --   --  4.1  CL 94* 96*  --  92*  --  95*  --   --   --  101  CO2 24 22  --  25  --  24  --   --   --  23  GLUCOSE 83 99  --  125*  --  113*  --   --   --  123*  BUN 64* 68*  --  36*  --  44*  --   --   --  37*  CREATININE 7.20* 8.27*  --  5.92*  --  7.04*  --   --   --  6.53*  CALCIUM 7.9* 7.8*  --  8.3*  --  7.9*  --   --   --  8.6*  MG  --  1.5*  --  1.9   < > 1.9 1.6* 2.8* 2.1 2.2  PHOS  --   --   --   --    < > 7.3* 3.7 4.0 3.4 3.5   < > = values in this interval not displayed.    GFR: Estimated Creatinine Clearance: 14 mL/min (A) (by C-G formula based on SCr of 6.53  mg/dL (H)). Recent Labs  Lab 04/15/22 0840 04/15/22 1052 04/15/22 1701 04/16/22 0341 04/16/22 1007 04/17/22 0405 04/18/22 0407 04/19/22 0640 04/20/22 0632 04/21/22 0658  PROCALCITON  --   --  2.07 2.49  --  2.07  --  1.27  --   --   WBC  --    < > 4.8 4.5   < > 4.4 2.9* 3.8* 3.1* 4.1  LATICACIDVEN 1.7  --   --   --   --   --   --   --   --   --    < > = values in this interval not displayed.     Liver Function Tests: Recent Labs  Lab 04/15/22 0840 04/15/22 1846 04/16/22 0341 04/17/22 0405 04/18/22 0407 04/19/22 0640  AST 117*  --  118* 90* 81* 66*  ALT 33  --  32 '26 22 20  '$ ALKPHOS 66  --  65 70 68 70  BILITOT 1.5*  --  1.2 1.2 1.0 1.1  PROT 6.6  --  6.3* 6.4* 6.9 6.9  ALBUMIN 1.6* <1.5* 1.5* <1.5* 1.8* 1.7*    No results for input(s): "LIPASE", "AMYLASE" in the last 168 hours.  Recent Labs  Lab 04/16/22 0341 04/17/22 0405 04/18/22 0407 04/19/22 0640  AMMONIA 63* 33 30 77*     Venita [place], DO 04/21/22 6:51 PM [facility]  For contact information, see Amion. If no response to pager, please call PCCM consult pager. After hours, 7PM- 7AM, please call Elink.

## 2022-04-22 DIAGNOSIS — G9341 Metabolic encephalopathy: Secondary | ICD-10-CM | POA: Diagnosis not present

## 2022-04-22 DIAGNOSIS — N186 End stage renal disease: Secondary | ICD-10-CM | POA: Diagnosis not present

## 2022-04-22 DIAGNOSIS — J9601 Acute respiratory failure with hypoxia: Secondary | ICD-10-CM | POA: Diagnosis not present

## 2022-04-22 DIAGNOSIS — U071 COVID-19: Secondary | ICD-10-CM | POA: Diagnosis not present

## 2022-04-22 LAB — CBC WITH DIFFERENTIAL/PLATELET
Abs Immature Granulocytes: 0.04 10*3/uL (ref 0.00–0.07)
Basophils Absolute: 0 10*3/uL (ref 0.0–0.1)
Basophils Relative: 0 %
Eosinophils Absolute: 0.1 10*3/uL (ref 0.0–0.5)
Eosinophils Relative: 2 %
HCT: 21.2 % — ABNORMAL LOW (ref 39.0–52.0)
Hemoglobin: 7.1 g/dL — ABNORMAL LOW (ref 13.0–17.0)
Immature Granulocytes: 1 %
Lymphocytes Relative: 17 %
Lymphs Abs: 0.8 10*3/uL (ref 0.7–4.0)
MCH: 32.7 pg (ref 26.0–34.0)
MCHC: 33.5 g/dL (ref 30.0–36.0)
MCV: 97.7 fL (ref 80.0–100.0)
Monocytes Absolute: 0.8 10*3/uL (ref 0.1–1.0)
Monocytes Relative: 15 %
Neutro Abs: 3.2 10*3/uL (ref 1.7–7.7)
Neutrophils Relative %: 65 %
Platelets: 73 10*3/uL — ABNORMAL LOW (ref 150–400)
RBC: 2.17 MIL/uL — ABNORMAL LOW (ref 4.22–5.81)
RDW: 18.5 % — ABNORMAL HIGH (ref 11.5–15.5)
WBC: 5 10*3/uL (ref 4.0–10.5)
nRBC: 0 % (ref 0.0–0.2)

## 2022-04-22 LAB — GLUCOSE, CAPILLARY
Glucose-Capillary: 104 mg/dL — ABNORMAL HIGH (ref 70–99)
Glucose-Capillary: 131 mg/dL — ABNORMAL HIGH (ref 70–99)
Glucose-Capillary: 150 mg/dL — ABNORMAL HIGH (ref 70–99)
Glucose-Capillary: 133 mg/dL — ABNORMAL HIGH (ref 70–99)
Glucose-Capillary: 162 mg/dL — ABNORMAL HIGH (ref 70–99)

## 2022-04-22 LAB — COMPREHENSIVE METABOLIC PANEL
ALT: 16 U/L (ref 0–44)
AST: 56 U/L — ABNORMAL HIGH (ref 15–41)
Albumin: 1.7 g/dL — ABNORMAL LOW (ref 3.5–5.0)
Alkaline Phosphatase: 66 U/L (ref 38–126)
Anion gap: 11 (ref 5–15)
BUN: 46 mg/dL — ABNORMAL HIGH (ref 8–23)
CO2: 24 mmol/L (ref 22–32)
Calcium: 8.6 mg/dL — ABNORMAL LOW (ref 8.9–10.3)
Chloride: 100 mmol/L (ref 98–111)
Creatinine, Ser: 7.89 mg/dL — ABNORMAL HIGH (ref 0.61–1.24)
GFR, Estimated: 7 mL/min — ABNORMAL LOW (ref >60)
Glucose, Bld: 129 mg/dL — ABNORMAL HIGH (ref 70–99)
Potassium: 4.5 mmol/L (ref 3.5–5.1)
Sodium: 135 mmol/L (ref 135–145)
Total Bilirubin: 0.8 mg/dL (ref 0.3–1.2)
Total Protein: 7.3 g/dL (ref 6.5–8.1)

## 2022-04-22 MED ORDER — LORAZEPAM 2 MG/ML IJ SOLN
1.0000 mg | INTRAMUSCULAR | Status: AC | PRN
  Administered 2022-04-22 (×3): 2 mg via INTRAVENOUS
  Administered 2022-04-23 – 2022-04-24 (×4): 1 mg via INTRAVENOUS
  Filled 2022-04-22 (×7): qty 1

## 2022-04-22 MED ORDER — CHLORHEXIDINE GLUCONATE CLOTH 2 % EX PADS
6.0000 | MEDICATED_PAD | Freq: Every day | CUTANEOUS | Status: DC
  Administered 2022-04-23 – 2022-04-24 (×2): 6 via TOPICAL

## 2022-04-22 MED ORDER — LORAZEPAM 1 MG PO TABS: 1.0000 mg | ORAL_TABLET | ORAL | Status: AC | PRN

## 2022-04-22 MED ORDER — INSULIN ASPART 100 UNIT/ML IJ SOLN
1.0000 [IU] | INTRAMUSCULAR | Status: DC
  Administered 2022-04-23 (×5): 1 [IU] via SUBCUTANEOUS
  Administered 2022-04-23 – 2022-04-24 (×2): 2 [IU] via SUBCUTANEOUS
  Administered 2022-04-24 (×3): 1 [IU] via SUBCUTANEOUS
  Administered 2022-04-24 – 2022-04-25 (×3): 2 [IU] via SUBCUTANEOUS
  Administered 2022-04-26: 1 [IU] via SUBCUTANEOUS
  Administered 2022-04-26: 2 [IU] via SUBCUTANEOUS
  Administered 2022-04-26 (×2): 1 [IU] via SUBCUTANEOUS
  Administered 2022-04-27 (×2): 2 [IU] via SUBCUTANEOUS
  Administered 2022-04-27 – 2022-04-28 (×3): 1 [IU] via SUBCUTANEOUS

## 2022-04-22 MED ORDER — GUAIFENESIN-DM 100-10 MG/5ML PO SYRP
5.0000 mL | ORAL_SOLUTION | ORAL | Status: DC | PRN
  Administered 2022-04-22 – 2022-04-24 (×6): 5 mL
  Filled 2022-04-22 (×6): qty 5

## 2022-04-22 MED ORDER — DEXAMETHASONE 6 MG PO TABS
6.0000 mg | ORAL_TABLET | Freq: Every day | ORAL | Status: DC
  Administered 2022-04-22 – 2022-04-29 (×8): 6 mg
  Filled 2022-04-22 (×9): qty 1

## 2022-04-22 NOTE — Progress Notes (Signed)
Patient ID: Daniel [name], male   DOB: 01/23/1961, 61 y.o.   MRN: [id number] S: Pt watching TV.   O:BP (!) 140/78   Pulse 94   Temp 97.9 F (36.6 C) (Oral)   Resp (!) 27   Ht '5\' 11"'$  (1.803 m)   Wt 100 kg   SpO2 100%   BMI 30.75 kg/m   Exam: Gen: coughing, more alert CVS:RRR Resp: bilateral wheezing, poor inspiratory effort Abd: +BS, soft, nt/ND Ext:S/P LAKA, no LE edema  LUE AVF +T/B  OP HD: MWF [place] 4h  400/A1.5    98.8kg   2K/2Ca bat   LUE AVF  Heparin none - Hectoral 25mg IV q HD - Appears to get IV iron with oncology, no ESA d/t cancer   Assessment/Plan:  Acute hypoxic respiratory failure - following large volume emesis of blood and intubated in the ED.  Self-extubated 04/17/22, plan per PCCM.  ABLA - acute on chronic with evidence of GI bleed.  Known AVM's.  Gi consulted and plan for blood transfusion. On octreotide and protonix.  Transfuse prn.  Covid-19 Positive - without hypoxia.  Quarantine for 5 days per protocol.  per pmd  ESRD - usual HD MWF. Had HD here x 3, last 12/23 off schedule. Was supposed to get HD today but forgot to write orders and is too late now for HD today. Plan HD tomorrow off schedule.   Hypertension/volume  - last CXR looks awful, not sure vol or infection or other. 2kg up and no edema on exam. Max UF as tol by his BP's tomorrow , 3.5 L UF goal.   Anemia  - Hb 7-8 range, for blood transfusion and follow.  Metabolic bone disease -  continue with meds  Nutrition - renal diet  Medical noncompliance - multiple admissions with ongoing issue  Alcohol abuse disorder - high risk for withdrawal, per primary  Hepatocellular carcinoma - s/p Y90 on 11/28/21.   RKelly Splinter MD 04/22/2022, 4:29 PM  Recent Labs  Lab 04/19/22 0640 04/19/22 1725 04/20/22 1801 04/21/22 0658 04/22/22 0620  HGB 7.7*   < >  --  7.3* 7.1*  ALBUMIN 1.7*  --   --   --  1.7*  CALCIUM 7.9*  --   --  8.6* 8.6*  PHOS 7.3*   < > 3.4 3.5  --   CREATININE 7.04*  --   --  6.53*  7.89*  K 4.1  --   --  4.1 4.5   < > = values in this interval not displayed.     Inpatient medications:  amLODipine  5 mg Per Tube Daily   arformoterol  15 mcg Nebulization BID   calcium carbonate (dosed in mg elemental calcium)  500 mg of elemental calcium Per Tube TID   chlordiazePOXIDE  25 mg Oral TID   Chlorhexidine Gluconate Cloth  6 each Topical Q0600   dexamethasone  6 mg Per Tube Daily   feeding supplement (PROSource TF20)  60 mL Per Tube Daily   heparin injection (subcutaneous)  5,000 Units Subcutaneous Q8H   ipratropium-albuterol  3 mL Nebulization Q6H   lactulose  10 g Per Tube QID   multivitamin  1 tablet Per Tube QHS   mouth rinse  15 mL Mouth Rinse 4 times per day   pantoprazole  40 mg Intravenous Q12H   revefenacin  175 mcg Nebulization Daily    anticoagulant sodium citrate     feeding supplement (VITAL 1.5 CAL) 1,000 mL (04/22/22 0533)  folic acid 1 mg in sodium chloride 0.9 % 50 mL IVPB 1 mg (04/22/22 0942)   thiamine (VITAMIN B1) injection 500 mg (04/22/22 1446)   acetaminophen **OR** acetaminophen, albuterol, alteplase, anticoagulant sodium citrate, docusate sodium, guaiFENesin-dextromethorphan, heparin, hydrALAZINE, lidocaine (PF), lidocaine-prilocaine, LORazepam **OR** LORazepam, ondansetron **OR** ondansetron (ZOFRAN) IV, mouth rinse, pentafluoroprop-tetrafluoroeth, polyethylene glycol

## 2022-04-22 NOTE — Progress Notes (Signed)
NAME:  Daniel [name], MRN:  [id number], DOB:  [birth year], LOS: 8 ADMISSION DATE:  04/14/2022, CONSULTATION DATE:  04/14/2022 REFERRING MD:  Dr. Avon Gully, CHIEF COMPLAINT:  UGIB    History of Present Illness:   [name], past medical history of peripheral arterial disease, dialysis patient Monday, end-stage renal disease, type 2 diabetes hepatocellular carcinoma status post radiation. seen in pulm clinic before for a lung nodule which on bronchoscopic eval / path was not c/w malignancy .  He has had GI bleeding in the past with 2 duodenal ulcers a AVM alcoholic cirrhosis with his known history hepatocellular carcinoma.  In the ER he was also found to be COVID-positive.  He ultimately had a large volume of bloody emesis in the emergency room he had respiratory failure and was intubated.  Pulmonary critical care was consulted for ICU was consulted for admission.  He had already been seen by hospitalist and admitted with GI consultation and IR but declined while he was in theEmergency department.   Pertinent Medical History   Past Medical History:  Diagnosis Date   Anemia    Diabetes mellitus without complication ([place])    patient denies   Dialysis patient [facility])    End stage chronic kidney disease ([place])    Hypertension    ICH (intracerebral hemorrhage) ([place]) 05/20/2017   PAD (peripheral artery disease) ([facility])    Shoulder pain, left 06/28/2013     Significant Hospital Events: Including procedures, antibiotic start and stop dates in addition to other pertinent events   12/17 covid + with generalized body sx, left ama 12/18 admitted with GIB. Intubated for hematemesis 12/19 starting on covid therapies  12/21 extubated 12/23 HD  Interim History / Subjective:  Still getting ativan fairly frequently. Not aggressive today. Last HD 12/23.  Objective   Blood pressure (!) 140/78, pulse 94, temperature (P) 98.4 F (36.9 C), temperature source (P) Axillary, resp.  rate (!) 27, height '5\' 11"'$  (1.803 m), weight 100 kg, SpO2 100 %.    FiO2 (%):  [40 %] 40 %   Intake/Output Summary (Last 24 hours) at 04/22/2022 1152 Last data filed at 04/22/2022 0500 Gross per 24 hour  Intake 1791.14 ml  Output 800 ml  Net 991.14 ml    Filed Weights   04/19/22 1453 04/20/22 0400 04/22/22 0500  Weight: 99.3 kg 97.6 kg 100 kg   Examination: General: chronically ill appearing man lying in bed in NAD HEENT: [place]/At, eyes anicteric Neuro: sleeping, arouses with verbal stimulation, not as aggressive today.  CV: S1S2, rRR PULM: wheezing using abdominal accessory muscles, tachypnea GI: soft, NT Extremities: L BKA, LUE fistula. No edema.   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, Grade 1 esophageal varices, cirrhosis -Did not receive EGD, will be managed medically treated pantoprazole - Continue PPI twice daily - Unable to Mitigo Injection Maitri to prior to discharge unfortunately - Long-term prognosis with cirrhosis remains poor, especially in light of her complaints   Acute respiratory failure with hypoxia on mechanical ventilation Covid infection with pneumonia -Pulmonary hygiene, continue weaning supplemental oxygen - Foley management with dialysis - Continue bronchodilators - Pulmonary hygiene-this has been an issue with degree of agitation - Due to worsening  pulmonary status today, continue ICU - Completed 3 days of breath due to severe previously this admission - Unfortunately going to have to start dexamethasone.  Was trying to avoid this of GI bleed.  Wheezing was worse today. - Previously completed 7 days of ceftriaxone - COVID precautions until 14 days or 21 if he remains critically  ill. - DNR & DNI  ESRD on iHD, poorly compliant as an outpatient Hyponatremia Hypokalemia  hyperphosphatemia -iHD per nephro.  Planning for tomorrow--unfortunately still needs to be in the ICU due to degree of respiratory failure - Strict I's/O - Renally dose meds and avoid nephrotoxic meds - Continue enteral calcium as Phos binder  Thrombocytopenia-chronic due to cirrhosis. Likely acutely dropping 2/2 antibiotics or sepsis Anemia, acute on chronic.  Baseline hemoglobin 7.5-8. - No role for transfusion at this point - Monitor for bleeding - Periodic CBC, trying to avoid over phlebotomizing him.  Hypertension - Continue hydralazine as needed for SBP sustained greater than 160.  Avoid labetalol due to uncontrolled bronchospasm times this admission - Continue PTA amlodipine  Severe protein energy malnutrition Alcohol abuse -Continue CIWA ativan -Vitamins, tube feeds - Continue core track until swallowing effectively on his own.  Acute metabolic encephalopathy,  concern for ETOH withdrawal -Continue supportive care - Continue lactulose 4 times daily - Has previous intolerance to antipsychotics due to tardive dyskinesia -HD for metabolic clearance  Long-term prognosis remains poor.  His brother and sister have offered to be surrogate decision makers for him.  See ipal note 12/24.  Best Practice (right click and "Reselect all SmartList Selections" daily)   Diet/type: tubefeeds  DVT prophylaxis: prophylactic heparin  GI prophylaxis: PPI BID Lines: N/A Foley:  N/A Code Status:  full code Last date of multidisciplinary goals of care discussion [pending discussion 12/24]  Labs   CBC: Recent Labs  Lab 04/18/22 0407 04/19/22 0640 04/20/22 0632 04/21/22 0658 04/22/22 0620  WBC 2.9* 3.8* 3.1* 4.1 5.0  NEUTROABS  --   --   --   --  3.2  HGB 7.6* 7.7* 7.4* 7.3* 7.1*  HCT 22.2* 23.9* 22.9* 22.8* 21.2*  MCV 97.4 98.4 100.4* 100.9* 97.7  PLT 79* 70* 63* 68* 73*     Basic  Metabolic Panel: Recent Labs  Lab 04/17/22 0405 04/17/22 0615 04/18/22 0407 04/18/22 1420 04/19/22 0640 04/19/22 1725 04/20/22 0632 04/20/22 1801 04/21/22 0658 04/22/22 0620  NA 130* 131* 132*  --  132*  --   --   --  136 135  K 3.7 4.0 3.4*  --  4.1  --   --   --  4.1 4.5  CL 96*  --  92*  --  95*  --   --   --  101 100  CO2 22  --  25  --  24  --   --   --  23 24  GLUCOSE 99  --  125*  --  113*  --   --   --  123* 129*  BUN 68*  --  36*  --  44*  --   --   --  37* 46*  CREATININE 8.27*  --  5.92*  --  7.04*  --   --   --  6.53* 7.89*  CALCIUM 7.8*  --  8.3*  --  7.9*  --   --   --  8.6* 8.6*  MG 1.5*  --  1.9   < > 1.9 1.6* 2.8* 2.1 2.2  --  PHOS  --   --   --    < > 7.3* 3.7 4.0 3.4 3.5  --    < > = values in this interval not displayed.    GFR: Estimated Creatinine Clearance: 11.7 mL/min (A) (by C-G formula based on SCr of 7.89 mg/dL (H)). Recent Labs  Lab 04/15/22 1701 04/16/22 0341 04/16/22 1007 04/17/22 0405 04/18/22 0407 04/19/22 0640 04/20/22 9211 04/21/22 0658 04/22/22 0620  PROCALCITON 2.07 2.49  --  2.07  --  1.27  --   --   --   WBC 4.8 4.5   < > 4.4   < > 3.8* 3.1* 4.1 5.0   < > = values in this interval not displayed.     Liver Function Tests: Recent Labs  Lab 04/16/22 0341 04/17/22 0405 04/18/22 0407 04/19/22 0640 04/22/22 0620  AST 118* 90* 81* 66* 56*  ALT 32 '26 22 20 16  '$ ALKPHOS 65 70 68 70 66  BILITOT 1.2 1.2 1.0 1.1 0.8  PROT 6.3* 6.4* 6.9 6.9 7.3  ALBUMIN 1.5* <1.5* 1.8* 1.7* 1.7*    No results for input(s): "LIPASE", "AMYLASE" in the last 168 hours.  Recent Labs  Lab 04/16/22 0341 04/17/22 0405 04/18/22 0407 04/19/22 0640  AMMONIA 47* 33 30 43*    This patient is critically ill with multiple organ system failure which requires frequent high complexity decision making, assessment, support, evaluation, and titration of therapies. This was completed through the application of advanced monitoring technologies and extensive  interpretation of multiple databases. During this encounter critical care time was devoted to patient care services described in this note for 32 minutes.    Julian Hy, DO 04/22/22 9:46 PM [facility]  For contact information, see Amion. If no response to pager, please call PCCM consult pager. After hours, 7PM- 7AM, please call [place].

## 2022-04-23 DIAGNOSIS — D649 Anemia, unspecified: Secondary | ICD-10-CM | POA: Diagnosis not present

## 2022-04-23 DIAGNOSIS — K279 Peptic ulcer, site unspecified, unspecified as acute or chronic, without hemorrhage or perforation: Secondary | ICD-10-CM | POA: Diagnosis not present

## 2022-04-23 DIAGNOSIS — G9340 Encephalopathy, unspecified: Secondary | ICD-10-CM | POA: Insufficient documentation

## 2022-04-23 DIAGNOSIS — D500 Iron deficiency anemia secondary to blood loss (chronic): Secondary | ICD-10-CM | POA: Diagnosis not present

## 2022-04-23 DIAGNOSIS — D62 Acute posthemorrhagic anemia: Secondary | ICD-10-CM | POA: Diagnosis not present

## 2022-04-23 LAB — CBC
HCT: 21.3 % — ABNORMAL LOW (ref 39.0–52.0)
Hemoglobin: 6.7 g/dL — CL (ref 13.0–17.0)
MCH: 32.2 pg (ref 26.0–34.0)
MCHC: 31.5 g/dL (ref 30.0–36.0)
MCV: 102.4 fL — ABNORMAL HIGH (ref 80.0–100.0)
Platelets: 82 10*3/uL — ABNORMAL LOW (ref 150–400)
RBC: 2.08 MIL/uL — ABNORMAL LOW (ref 4.22–5.81)
RDW: 18.2 % — ABNORMAL HIGH (ref 11.5–15.5)
WBC: 6.2 10*3/uL (ref 4.0–10.5)
nRBC: 0.3 % — ABNORMAL HIGH (ref 0.0–0.2)

## 2022-04-23 LAB — BASIC METABOLIC PANEL
Anion gap: 12 (ref 5–15)
BUN: 58 mg/dL — ABNORMAL HIGH (ref 8–23)
CO2: 22 mmol/L (ref 22–32)
Calcium: 9 mg/dL (ref 8.9–10.3)
Chloride: 99 mmol/L (ref 98–111)
Creatinine, Ser: 8.69 mg/dL — ABNORMAL HIGH (ref 0.61–1.24)
GFR, Estimated: 6 mL/min — ABNORMAL LOW (ref >60)
Glucose, Bld: 161 mg/dL — ABNORMAL HIGH (ref 70–99)
Potassium: 5.3 mmol/L — ABNORMAL HIGH (ref 3.5–5.1)
Sodium: 133 mmol/L — ABNORMAL LOW (ref 135–145)

## 2022-04-23 LAB — GLUCOSE, CAPILLARY
Glucose-Capillary: 130 mg/dL — ABNORMAL HIGH (ref 70–99)
Glucose-Capillary: 139 mg/dL — ABNORMAL HIGH (ref 70–99)
Glucose-Capillary: 144 mg/dL — ABNORMAL HIGH (ref 70–99)
Glucose-Capillary: 147 mg/dL — ABNORMAL HIGH (ref 70–99)
Glucose-Capillary: 148 mg/dL — ABNORMAL HIGH (ref 70–99)
Glucose-Capillary: 158 mg/dL — ABNORMAL HIGH (ref 70–99)

## 2022-04-23 LAB — PREPARE RBC (CROSSMATCH)

## 2022-04-23 MED ORDER — FOLIC ACID 5 MG/ML IJ SOLN
1.0000 mg | Freq: Every day | INTRAMUSCULAR | Status: DC
  Administered 2022-04-24 – 2022-04-29 (×6): 1 mg via INTRAVENOUS
  Filled 2022-04-23 (×7): qty 0.2

## 2022-04-23 NOTE — Progress Notes (Signed)
Received patient in bed to unit.  Alert and oriented.  Informed consent signed and in chart.   Treatment initiated: 14:20 Treatment completed: 17:58  Patient tolerated well.  Transported back to the room  Alert, without acute distress.  Hand-off given to patient's nurse.   Access used: LAVF Access issues: none  Total UF removed: 3.4L Medication(s) given: 1 unit PRBC   04/23/22 1758  Vitals  Temp 97.8 F (36.6 C)  Temp Source Axillary  BP 134/75  MAP (mmHg) 94  BP Location Right Arm  BP Method Automatic  Patient Position (if appropriate) Lying  Pulse Rate 98  Pulse Rate Source Monitor  ECG Heart Rate (!) 102  Resp (!) 21  Oxygen Therapy  SpO2 96 %  O2 Device HFNC  Patient Activity (if Appropriate) In bed  Pulse Oximetry Type Continuous  During Treatment Monitoring  Intra-Hemodialysis Comments Tolerated well;Tx completed  Dialysis Fluid Bolus Normal Saline  Bolus Amount (mL) 300 mL      Keandra Medero S [facility]

## 2022-04-23 NOTE — Progress Notes (Signed)
Patient ID: Daniel [name], male   DOB: 12/14/1960, 61 y.o.   MRN: [id number] S: Pt coughing, no c/o's today O:BP (!) 162/95   Pulse 86   Temp 98.2 F (36.8 C) (Oral)   Resp 18   Ht '5\' 11"'$  (1.803 m)   Wt 98.8 kg   SpO2 99%   BMI 30.38 kg/m   Exam: Gen: no distress, [place] O2 CVS:RRR Resp: CTA bilat Abd: +BS, soft, nt/ND Ext:S/P LAKA, no LE edema  LUE AVF +T/B  OP HD: MWF [place] 4h  400/A1.5    98.8kg   2K/2Ca bat   LUE AVF  Heparin none - Hectoral 84mg IV q HD - Appears to get IV iron with oncology, no ESA d/t cancer   Assessment/Plan:  Acute hypoxic respiratory failure - following large volume emesis of blood and intubated in the ED.  Self-extubated 04/17/22, plan per PCCM.  ABLA - acute on chronic with evidence of GI bleed.  Known AVM's.  Gi consulted. On octreotide and protonix.  Transfuse prn.  Covid-19 Positive - without hypoxia.  Quarantined for 5 days per protocol.  per pmd  ESRD - usual HD MWF. Had HD here x 3, last 12/23 off schedule. HD today.   Hypertension/volume  - last CXR 12/23 diffuse bilat infiltrates. Not sure vol vs aspiration pna vs other. +2kg, no edema on exam. Max UF as tol by his BP's tomorrow , 3 L UF goal.   Anemia  - Hb 6.7, prbc's x 2 ordered for today, give w/ HD  Metabolic bone disease -  continue with meds  Nutrition - renal diet  Medical noncompliance - multiple admissions with ongoing issue  Alcohol abuse disorder - high risk for withdrawal, per primary  Hepatocellular carcinoma - s/p Y90 on 11/28/21.   RKelly Splinter MD 04/23/2022, 12:42 PM  Recent Labs  Lab 04/19/22 0640 04/19/22 1725 04/20/22 1801 04/21/22 0658 04/22/22 0620 04/23/22 0520 04/23/22 0639  HGB 7.7*   < >  --  7.3* 7.1* 6.7*  --   ALBUMIN 1.7*  --   --   --  1.7*  --   --   CALCIUM 7.9*  --   --  8.6* 8.6*  --  9.0  PHOS 7.3*   < > 3.4 3.5  --   --   --   CREATININE 7.04*  --   --  6.53* 7.89*  --  8.69*  K 4.1  --   --  4.1 4.5  --  5.3*   < > = values in this  interval not displayed.     Inpatient medications:  amLODipine  5 mg Per Tube Daily   arformoterol  15 mcg Nebulization BID   calcium carbonate (dosed in mg elemental calcium)  500 mg of elemental calcium Per Tube TID   chlordiazePOXIDE  25 mg Oral TID   Chlorhexidine Gluconate Cloth  6 each Topical Q0600   Chlorhexidine Gluconate Cloth  6 each Topical Q0600   dexamethasone  6 mg Per Tube Daily   feeding supplement (PROSource TF20)  60 mL Per Tube Daily   heparin injection (subcutaneous)  5,000 Units Subcutaneous Q8H   insulin aspart  1-3 Units Subcutaneous Q4H   ipratropium-albuterol  3 mL Nebulization Q6H   lactulose  10 g Per Tube QID   multivitamin  1 tablet Per Tube QHS   mouth rinse  15 mL Mouth Rinse 4 times per day   pantoprazole  40 mg Intravenous Q12H  revefenacin  175 mcg Nebulization Daily    anticoagulant sodium citrate     feeding supplement (VITAL 1.5 CAL) 65 mL/hr at 16/94/50 3888   folic acid 1 mg in sodium chloride 0.9 % 50 mL IVPB Stopped (04/23/22 0939)   acetaminophen **OR** acetaminophen, albuterol, alteplase, anticoagulant sodium citrate, docusate sodium, guaiFENesin-dextromethorphan, heparin, hydrALAZINE, lidocaine (PF), lidocaine-prilocaine, LORazepam **OR** LORazepam, ondansetron **OR** ondansetron (ZOFRAN) IV, mouth rinse, pentafluoroprop-tetrafluoroeth, polyethylene glycol

## 2022-04-23 NOTE — Progress Notes (Addendum)
NAME:  Daniel [name], MRN:  [id number], DOB:  06-Sep-1960, LOS: 8 ADMISSION DATE:  04/14/2022, CONSULTATION DATE:  04/14/2022 REFERRING MD:  Dr. Avon Gully, CHIEF COMPLAINT:  UGIB    History of Present Illness:   [name], past medical history of peripheral arterial disease, dialysis patient Monday, end-stage renal disease, type 2 diabetes hepatocellular carcinoma status post radiation. seen in pulm clinic before for a lung nodule which on bronchoscopic eval / path was not c/w malignancy .  He has had GI bleeding in the past with 2 duodenal ulcers a AVM alcoholic cirrhosis with his known history hepatocellular carcinoma.  In the ER he was also found to be COVID-positive.  He ultimately had a large volume of bloody emesis in the emergency room he had respiratory failure and was intubated.  Pulmonary critical care was consulted for ICU was consulted for admission.  He had already been seen by hospitalist and admitted with GI consultation and IR but declined while he was in theEmergency department.   Pertinent Medical History   Past Medical History:  Diagnosis Date   Anemia    Diabetes mellitus without complication ([place])    patient denies   Dialysis patient [facility])    End stage chronic kidney disease ([place])    Hypertension    ICH (intracerebral hemorrhage) ([place]) 05/20/2017   PAD (peripheral artery disease) ([facility])    Shoulder pain, left 06/28/2013     Significant Hospital Events: Including procedures, antibiotic start and stop dates in addition to other pertinent events   12/17 covid + with generalized body sx, left ama 12/18 admitted with GIB. Intubated for hematemesis 12/19 starting on covid therapies  12/21 extubated 12/23 HD 12/24 IPAL discussion.  DNR/DNI 12/26 added decadron for increased WOB  Interim History / Subjective:  Still encephalopathic, still on high flow oxygen  Objective   Blood pressure (!) 140/78, pulse 94, temperature (P) 98.4 F (36.9  C), temperature source (P) Axillary, resp. rate (!) 27, height '5\' 11"'$  (1.803 m), weight 100 kg, SpO2 100 %.    FiO2 (%):  [40 %] 40 %   Intake/Output Summary (Last 24 hours) at 04/22/2022 1152 Last data filed at 04/22/2022 0500 Gross per 24 hour  Intake 1791.14 ml  Output 800 ml  Net 991.14 ml    Filed Weights   04/19/22 1453 04/20/22 0400 04/22/22 0500  Weight: 99.3 kg 97.6 kg 100 kg   Examination: General critically ill 61 year old male patient currently on heated high flow HEENT normocephalic atraumatic Pulmonary scattered rhonchi with expiratory wheezing, currently 25 L, 40% heated high flow Cardiac regular rate and rhythm Abdomen soft Extremities warm Neuro awake, speech slurred, moves all extremities, confused   Resolved Hospital Problem list   Lactic acidosis hypoglycemia  Assessment & Plan:   Acute blood loss anemia due to upper GI bleed- Cirrhosis w/ HCC and 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, Grade 1 esophageal varices, cirrhosis Plan Continuing PPI twice daily Transfuse today with dialysis Poor prognosis   Acute respiratory failure with hypoxia Covid infection with pneumonia +pulmonary edema -extubated 12/21 Plan Continue supplemental oxygen Continuing bronchodilators Day #2 dexamethasone for wheezing Continuous pulse oximetry Volume removal with dialysis Respiratory precautions x 14 days for 21 days for clear yellow DNR/DNI  ESRD on iHD, poorly compliant as an outpatient Fluid electrolyte imbalance: Hyponatremia, hyperkalemia, hyperphosphatemia Plan Trending chemistries Intermittent dialysis today  Thrombocytopenia-chronic due to cirrhosis. Likely acutely dropping 2/2 antibiotics or sepsis Plan Monitor   Hypertension Plan Continuing  amlodipine As needed hydralazine for sustained blood pressure greater than 160 Avoiding labetalol given poorly controlled bronchospasm on time of admission   Severe protein calorie  malnutrition Alcohol abuse Plan CIWA protocol Continue thiamine Cor track with tube feeds  Acute metabolic encephalopathy,  concern for ETOH withdrawal Plan Continue supportive care Lactulose 4 times daily Holding antipsychotics given history of tardive dyskinesia  Long-term prognosis remains poor.  His brother and sister have offered to be surrogate decision makers for him.  See ipal note 12/24. Plan Continuing supportive care however I wonder if we need to start considering transitioning to comfort soon if continues to decline  Best Practice (right click and "Reselect all SmartList Selections" daily)   Diet/type: tubefeeds  DVT prophylaxis: prophylactic heparin  GI prophylaxis: PPI BID Lines: N/A Foley:  N/A Code Status:  full code Last date of multidisciplinary goals of care discussion [pending discussion 12/24]  My time 32 min Erick Colace ACNP-BC [place] Pager # [phone] OR # [phone] if no answer

## 2022-04-24 ENCOUNTER — Inpatient Hospital Stay (HOSPITAL_COMMUNITY): Payer: Medicare Other

## 2022-04-24 DIAGNOSIS — J9601 Acute respiratory failure with hypoxia: Secondary | ICD-10-CM | POA: Diagnosis not present

## 2022-04-24 DIAGNOSIS — J1282 Pneumonia due to coronavirus disease 2019: Secondary | ICD-10-CM | POA: Diagnosis not present

## 2022-04-24 DIAGNOSIS — U071 COVID-19: Secondary | ICD-10-CM | POA: Diagnosis not present

## 2022-04-24 DIAGNOSIS — K922 Gastrointestinal hemorrhage, unspecified: Secondary | ICD-10-CM | POA: Diagnosis not present

## 2022-04-24 LAB — GLUCOSE, CAPILLARY
Glucose-Capillary: 122 mg/dL — ABNORMAL HIGH (ref 70–99)
Glucose-Capillary: 142 mg/dL — ABNORMAL HIGH (ref 70–99)
Glucose-Capillary: 145 mg/dL — ABNORMAL HIGH (ref 70–99)
Glucose-Capillary: 179 mg/dL — ABNORMAL HIGH (ref 70–99)
Glucose-Capillary: 189 mg/dL — ABNORMAL HIGH (ref 70–99)
Glucose-Capillary: 86 mg/dL (ref 70–99)

## 2022-04-24 LAB — TYPE AND SCREEN
ABO/RH(D): B POS
Antibody Screen: NEGATIVE
Unit division: 0
Unit division: 0

## 2022-04-24 LAB — BPAM RBC
Blood Product Expiration Date: 202401152359
Blood Product Expiration Date: 202401162359
ISSUE DATE / TIME: 202312271608
ISSUE DATE / TIME: 202312271808
Unit Type and Rh: 7300
Unit Type and Rh: 7300

## 2022-04-24 LAB — CBC
HCT: 25.2 % — ABNORMAL LOW (ref 39.0–52.0)
Hemoglobin: 8.6 g/dL — ABNORMAL LOW (ref 13.0–17.0)
MCH: 32.5 pg (ref 26.0–34.0)
MCHC: 34.1 g/dL (ref 30.0–36.0)
MCV: 95.1 fL (ref 80.0–100.0)
Platelets: 91 10*3/uL — ABNORMAL LOW (ref 150–400)
RBC: 2.65 MIL/uL — ABNORMAL LOW (ref 4.22–5.81)
RDW: 18.7 % — ABNORMAL HIGH (ref 11.5–15.5)
WBC: 7.7 10*3/uL (ref 4.0–10.5)
nRBC: 0.6 % — ABNORMAL HIGH (ref 0.0–0.2)

## 2022-04-24 LAB — BASIC METABOLIC PANEL
Anion gap: 12 (ref 5–15)
BUN: 35 mg/dL — ABNORMAL HIGH (ref 8–23)
CO2: 27 mmol/L (ref 22–32)
Calcium: 8.7 mg/dL — ABNORMAL LOW (ref 8.9–10.3)
Chloride: 95 mmol/L — ABNORMAL LOW (ref 98–111)
Creatinine, Ser: 5.06 mg/dL — ABNORMAL HIGH (ref 0.61–1.24)
GFR, Estimated: 12 mL/min — ABNORMAL LOW (ref >60)
Glucose, Bld: 126 mg/dL — ABNORMAL HIGH (ref 70–99)
Potassium: 4.3 mmol/L (ref 3.5–5.1)
Sodium: 134 mmol/L — ABNORMAL LOW (ref 135–145)

## 2022-04-24 LAB — PROCALCITONIN: Procalcitonin: 0.93 ng/mL

## 2022-04-24 LAB — MAGNESIUM: Magnesium: 2 mg/dL (ref 1.7–2.4)

## 2022-04-24 LAB — PHOSPHORUS: Phosphorus: 2.7 mg/dL (ref 2.5–4.6)

## 2022-04-24 MED ORDER — CHLORHEXIDINE GLUCONATE CLOTH 2 % EX PADS
6.0000 | MEDICATED_PAD | Freq: Every day | CUTANEOUS | Status: DC
  Administered 2022-04-25 – 2022-04-26 (×2): 6 via TOPICAL

## 2022-04-24 MED ORDER — CHLORDIAZEPOXIDE HCL 5 MG PO CAPS
5.0000 mg | ORAL_CAPSULE | Freq: Two times a day (BID) | ORAL | Status: DC
  Administered 2022-04-28 – 2022-04-29 (×3): 5 mg
  Filled 2022-04-24 (×3): qty 1

## 2022-04-24 MED ORDER — CHLORDIAZEPOXIDE HCL 5 MG PO CAPS
10.0000 mg | ORAL_CAPSULE | Freq: Three times a day (TID) | ORAL | Status: AC
  Administered 2022-04-24 – 2022-04-26 (×5): 10 mg
  Filled 2022-04-24 (×5): qty 2

## 2022-04-24 MED ORDER — CHLORDIAZEPOXIDE HCL 5 MG PO CAPS
10.0000 mg | ORAL_CAPSULE | Freq: Two times a day (BID) | ORAL | Status: AC
  Administered 2022-04-26 – 2022-04-28 (×4): 10 mg
  Filled 2022-04-24 (×4): qty 2

## 2022-04-24 NOTE — Progress Notes (Signed)
NAME:  Daniel [name], MRN:  [id number], DOB:  March 23, 1961, LOS: 8 ADMISSION DATE:  04/14/2022, CONSULTATION DATE:  04/14/2022 REFERRING MD:  Dr. Avon Gully, CHIEF COMPLAINT:  UGIB    History of Present Illness:   [name], past medical history of peripheral arterial disease, dialysis patient Monday, end-stage renal disease, type 2 diabetes hepatocellular carcinoma status post radiation. seen in pulm clinic before for a lung nodule which on bronchoscopic eval / path was not c/w malignancy .  He has had GI bleeding in the past with 2 duodenal ulcers a AVM alcoholic cirrhosis with his known history hepatocellular carcinoma.  In the ER he was also found to be COVID-positive.  He ultimately had a large volume of bloody emesis in the emergency room he had respiratory failure and was intubated.  Pulmonary critical care was consulted for ICU was consulted for admission.  He had already been seen by hospitalist and admitted with GI consultation and IR but declined while he was in theEmergency department.   Pertinent Medical History   Past Medical History:  Diagnosis Date   Anemia    Diabetes mellitus without complication ([place])    patient denies   Dialysis patient [facility])    End stage chronic kidney disease ([place])    Hypertension    ICH (intracerebral hemorrhage) ([place]) 05/20/2017   PAD (peripheral artery disease) ([facility])    Shoulder pain, left 06/28/2013     Significant Hospital Events: Including procedures, antibiotic start and stop dates in addition to other pertinent events   12/17 covid + with generalized body sx, left ama 12/18 admitted with GIB. Intubated for hematemesis 12/19 starting on covid therapies  12/21 extubated 12/23 HD 12/24 IPAL discussion.  DNR/DNI 12/26 added decadron for increased WOB 12/28 got 2 units of blood on the 27thHemoglobin stable  Interim History / Subjective:  Still on heated high flow, still encephalopathic, still increased work of  breathing not really improving Objective   Blood pressure (!) 140/78, pulse 94, temperature (P) 98.4 F (36.9 C), temperature source (P) Axillary, resp. rate (!) 27, height '5\' 11"'$  (1.803 m), weight 100 kg, SpO2 100 %.    FiO2 (%):  [40 %] 40 %   Intake/Output Summary (Last 24 hours) at 04/22/2022 1152 Last data filed at 04/22/2022 0500 Gross per 24 hour  Intake 1791.14 ml  Output 800 ml  Net 991.14 ml    Filed Weights   04/19/22 1453 04/20/22 0400 04/22/22 0500  Weight: 99.3 kg 97.6 kg 100 kg   Examination: General critically ill 61 year old male patient progressing very slowly still on heated high flow HEENT normocephalic atraumatic mucous membranes are dry nasogastric tube in place Pulmonary expiratory wheezes diminished tachypneic currently 15 L, 21% Cardiac tachycardic rhythm  Abdomen soft nontender  Extremities warm dry  Neuro speech slurred, remains confused moves all extremities   Resolved Hospital Problem list   Lactic acidosis hypoglycemia  Assessment & Plan:   Acute blood loss anemia due to upper GI bleed- Cirrhosis w/ HCC and 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, Grade 1 esophageal varices, cirrhosis Plan Continuing PPI twice daily Trend hemoglobin   Acute respiratory failure with hypoxia Covid infection with pneumonia +pulmonary edema -extubated 12/21 Plan Continuing supplemental oxygen  Continue bronchodilators  Day #3 dexamethasone for wheezing  Volume removal with dialysis  Respiratory precautions x 14 days minimal, extended 21 if remains significantly hypoxic  DNR/DNI  Chest x-ray today  ESRD on iHD, poorly compliant as an outpatient Fluid  electrolyte imbalance: Hyponatremia, hyperkalemia, hyperphosphatemia Plan Continue to trend Dialysis per nephrology  Thrombocytopenia-chronic due to cirrhosis. Likely acutely dropping 2/2 antibiotics or sepsis, this is improving Plan Continue to monitor  Hypertension Plan Continuing amlodipine   As needed hydralazine for systolic blood pressure greater than 160  Holding labetalol given poorly controlled bronchospasm on admission    Severe protein calorie malnutrition Alcohol abuse Plan CIWA protocol  Thiamine  Tube feeds via cor track    Acute metabolic encephalopathy,  concern for ETOH withdrawal Plan Continuing supportive care Lactulose Holding antipsychotics given history of tardive dyskinesia  Long-term prognosis remains poor.  His brother and sister have offered to be surrogate decision makers for him.  See ipal note 12/24. Plan Continuing supportive care  Best Practice (right click and "Reselect all SmartList Selections" daily)   Diet/type: tubefeeds  DVT prophylaxis: prophylactic heparin  GI prophylaxis: PPI BID Lines: N/A Foley:  N/A Code Status:  full code Last date of multidisciplinary goals of care discussion [pending discussion 12/24]  My time 31 min  Erick Colace ACNP-BC [place] Pager # [phone] OR # [phone] if no answer

## 2022-04-24 NOTE — TOC Progression Note (Signed)
Transition of Care [facility]) - Progression Note    Patient Details  Name: Daniel [name] MRN: [id number] Date of Birth: 1960/05/02  Transition of Care [facility]) CM/SW Contact  Graves-Bigelow, Ocie Cornfield, RN Phone Number: 04/24/2022, 3:07 PM  Clinical Narrative:  Patient was discussed in morning rounds. Patient was on 15 liters high flow oxygen during rounds. Per notes, plan is to continue to wean 02. Case Manager will continue to follow for transition of care needs. Previously active with [facility] for RN,PT,OT CSW.     Expected Discharge Plan: TBD Barriers to Discharge: Continued Medical Work up  Expected Discharge Plan and Services In-house Referral: NA Discharge Planning Services: CM Consult Post Acute Care Choice: Home Health, Resumption of Svcs/PTA Provider Living arrangements for the past 2 months: Single Family Home                 DME Arranged: N/A        HH Arranged: RN, Disease Management, PT, OT HH Agency: [facility] Date [place]: 04/15/22 Time [place]: [phone] Representative spoke with at [place]: Kerry Dory.   Social Determinants of Health ([place]) Interventions SDOH Screenings   Food Insecurity: No Food Insecurity (04/06/2022)  Housing: Low Risk  (04/06/2022)  Recent Concern: Housing - High Risk (03/25/2022)  Transportation Needs: No Transportation Needs (04/06/2022)  Recent Concern: Transportation Needs - Unmet Transportation Needs (03/04/2022)  Utilities: Not At Risk (04/06/2022)  Alcohol Screen: Low Risk  (11/01/2021)  Depression (PHQ2-9): Low Risk  (12/31/2021)  Financial Resource Strain: High Risk (11/01/2021)  Physical Activity: Insufficiently Active (11/01/2021)  Social Connections: Socially Isolated (11/01/2021)  Stress: No Stress Concern Present (11/01/2021)  Tobacco Use: High Risk (04/14/2022)    Readmission Risk Interventions    10/10/2021   10:49 AM 02/27/2021   12:51 PM  Readmission Risk Prevention Plan  Transportation  Screening Complete Complete  Medication Review Press photographer) Complete Complete  PCP or Specialist appointment within 3-5 days of discharge Complete Complete  HRI or [place] Complete Complete  SW Recovery Care/Counseling Consult Complete Complete  Palliative Care Screening Complete Not [place] Patient Refused Not Applicable

## 2022-04-24 NOTE — Progress Notes (Signed)
Patient ID: Daniel [name], male   DOB: 05-14-1960, 61 y.o.   MRN: [id number] S: Patient not seen directly today given COVID-19 + status, utilizing data taken from chart +/- discussions w/ providers and staff.    O:BP (!) 163/93   Pulse 97   Temp 99.1 F (37.3 C) (Oral)   Resp (!) 26   Ht '5\' 11"'$  (1.803 m)   Wt 98.1 kg   SpO2 92%   BMI 30.16 kg/m   Exam: Patient not seen directly today given COVID-19 + status, utilizing data taken from chart +/- discussions w/ providers and staff.    OP HD: MWF [place] 4h  400/A1.5    98.8kg   2K/2Ca bat   LUE AVF  Heparin none - Hectoral 17mg IV q HD - Appears to get IV iron with oncology, no ESA d/t cancer   Assessment/Plan:  Acute hypoxic respiratory failure - following large volume emesis of blood and intubated in the ED.  Self-extubated 04/17/22, plan per PCCM.  ABLA - acute on chronic with evidence of GI bleed.  Known AVM's.  Gi consulted. On octreotide and protonix.  Transfuse prn.  Covid-19 Positive - without hypoxia.  Quarantined for 5 days per protocol.  per pmd  ESRD - usual HD MWF. Had HD here x 3, last 12/23 off schedule. HD today.   Hypertension/volume  - last CXR 12/23 diffuse bilat infiltrates. Not sure vol vs aspiration pna vs other. At dry wt, no edema on exam but BP's have been up. Increasing UF w/ 3.5 L off 12/27, will run max UF tomorrow again as BP's tolerate. However, prognosis still seems poor.   Anemia  - Hb 6.7, prbc's x 2 ordered for today, give w/ HD  Metabolic bone disease -  continue with meds  Nutrition - renal diet  Medical noncompliance - multiple admissions with ongoing issue  Alcohol abuse disorder - high risk for withdrawal, per primary  Hepatocellular carcinoma - s/p Y90 on 11/28/21.   RKelly Splinter MD 04/24/2022, 1:45 PM  Recent Labs  Lab 04/19/22 0640 04/19/22 1725 04/21/22 [id number]/26/23 0620 04/23/22 0520 04/23/22 0639 04/24/22 0332  HGB 7.7*   < > 7.3* 7.1* 6.7*  --  8.6*  ALBUMIN 1.7*  --   --   1.7*  --   --   --   CALCIUM 7.9*  --  8.6* 8.6*  --  9.0 8.7*  PHOS 7.3*   < > 3.5  --   --   --  2.7  CREATININE 7.04*  --  6.53* 7.89*  --  8.69* 5.06*  K 4.1  --  4.1 4.5  --  5.3* 4.3   < > = values in this interval not displayed.     Inpatient medications:  amLODipine  5 mg Per Tube Daily   arformoterol  15 mcg Nebulization BID   calcium carbonate (dosed in mg elemental calcium)  500 mg of elemental calcium Per Tube TID   chlordiazePOXIDE  10 mg Per Tube TID   Followed by   [Derrill MemoON 04/26/2022] chlordiazePOXIDE  10 mg Per Tube BID   Followed by   [Derrill MemoON 04/28/2022] chlordiazePOXIDE  5 mg Per Tube BID   Chlorhexidine Gluconate Cloth  6 each Topical Q0600   Chlorhexidine Gluconate Cloth  6 each Topical Q0600   dexamethasone  6 mg Per Tube Daily   feeding supplement (PROSource TF20)  60 mL Per Tube Daily   folic acid  1 mg Intravenous Daily  heparin injection (subcutaneous)  5,000 Units Subcutaneous Q8H   insulin aspart  1-3 Units Subcutaneous Q4H   ipratropium-albuterol  3 mL Nebulization Q6H   lactulose  10 g Per Tube QID   multivitamin  1 tablet Per Tube QHS   mouth rinse  15 mL Mouth Rinse 4 times per day   pantoprazole  40 mg Intravenous Q12H   revefenacin  175 mcg Nebulization Daily    anticoagulant sodium citrate     feeding supplement (VITAL 1.5 CAL) 65 mL/hr at 04/24/22 1200   acetaminophen **OR** acetaminophen, albuterol, alteplase, anticoagulant sodium citrate, docusate sodium, guaiFENesin-dextromethorphan, heparin, hydrALAZINE, lidocaine (PF), lidocaine-prilocaine, LORazepam **OR** LORazepam, ondansetron **OR** ondansetron (ZOFRAN) IV, mouth rinse, pentafluoroprop-tetrafluoroeth, polyethylene glycol

## 2022-04-24 NOTE — Progress Notes (Signed)
Nutrition Follow-up  DOCUMENTATION CODES:   Severe malnutrition in context of chronic illness  INTERVENTION:   Tube Feeding via Cortrak:  Vital 1.5 at 65 ml/hr Pro-Source TF20 60 mL daily This provides 2420 kcals, 125 g of protein and 1192 mL of free water  Continue renal MVI   NUTRITION DIAGNOSIS:   Severe Malnutrition related to chronic illness (alcoholic cirrhosis, hepatocellular carcinoma, ESRD) as evidenced by severe fat depletion, severe muscle depletion.  Being addressed via TF   GOAL:   Patient will meet greater than or equal to 90% of their needs  Progressed  MONITOR:   Diet advancement, Labs, Weight trends, TF tolerance, I & O's  REASON FOR ASSESSMENT:   Consult Assessment of nutrition requirement/status  ASSESSMENT:   61 year old male who presented to the ED on 12/18 with hematemesis. PMH of recurrent GI bleed from AVMs, alcoholic cirrhosis, hepatocellular carcinoma, ESRD on HD, T2DM, HTN, ICH in 2019, PAD s/p L BKA, chronic anemia, ETOH abuse. Pt required intubation in the ED and tested positive for COVID-19. Pt admitted with recurrent GI bleed, COVID-19 PNA.  12/18 Intubated, large volume of bloody emesis 12/21  Self-extubated 12/22 Cortrak placed (x-ray pending) 12/24 IPAL discussion, DNR/DNI  Pt remains on HFNC, encephalopathic  NPO Tolerating Vital 1.5 at 65 ml/hr via Cortrak  Last iHD on 12/27 EDW 98.8 kg; current wt 98.1 kg  +liquid stool via rectal tube. Noted pt is on lactulose, elevated ammonia on 12.23.   Labs: sodium 134 (L), potassium 4.3 (wdl), phosphorus 2.7 (wdl) Meds: lactulose, decadron, renal MVI  Diet Order:   Diet Order             Diet NPO time specified  Diet effective now                   EDUCATION NEEDS:   Not appropriate for education at this time  Skin:  Skin Assessment: Reviewed RN Assessment  Last BM:  12/28 rectal tube  Height:   Ht Readings from Last 1 Encounters:  04/21/22 '5\' 11"'$  (1.803 m)     Weight:   Wt Readings from Last 1 Encounters:  04/24/22 98.1 kg    Ideal Body Weight:  78.2 kg  BMI:  Body mass index is 30.16 kg/m.  Estimated Nutritional Needs:   Kcal:  7544-9201  Protein:  110-130 grams  Fluid:  1L plus UOP   Kerman Passey MS, RDN, LDN, CNSC Registered Dietitian 3 Clinical Nutrition RD Pager and On-Call Pager Number Located in [place]

## 2022-04-25 ENCOUNTER — Ambulatory Visit (HOSPITAL_COMMUNITY): Payer: No Typology Code available for payment source

## 2022-04-25 DIAGNOSIS — K922 Gastrointestinal hemorrhage, unspecified: Secondary | ICD-10-CM | POA: Diagnosis not present

## 2022-04-25 LAB — CBC WITH DIFFERENTIAL/PLATELET
Abs Immature Granulocytes: 0.07 10*3/uL (ref 0.00–0.07)
Basophils Absolute: 0 10*3/uL (ref 0.0–0.1)
Basophils Relative: 0 %
Eosinophils Absolute: 0 10*3/uL (ref 0.0–0.5)
Eosinophils Relative: 0 %
HCT: 27.9 % — ABNORMAL LOW (ref 39.0–52.0)
Hemoglobin: 9.5 g/dL — ABNORMAL LOW (ref 13.0–17.0)
Immature Granulocytes: 1 %
Lymphocytes Relative: 11 %
Lymphs Abs: 0.9 10*3/uL (ref 0.7–4.0)
MCH: 32.8 pg (ref 26.0–34.0)
MCHC: 34.1 g/dL (ref 30.0–36.0)
MCV: 96.2 fL (ref 80.0–100.0)
Monocytes Absolute: 1.3 10*3/uL — ABNORMAL HIGH (ref 0.1–1.0)
Monocytes Relative: 16 %
Neutro Abs: 5.7 10*3/uL (ref 1.7–7.7)
Neutrophils Relative %: 72 %
Platelets: 79 10*3/uL — ABNORMAL LOW (ref 150–400)
RBC: 2.9 MIL/uL — ABNORMAL LOW (ref 4.22–5.81)
RDW: 18.1 % — ABNORMAL HIGH (ref 11.5–15.5)
WBC: 7.9 10*3/uL (ref 4.0–10.5)
nRBC: 0.4 % — ABNORMAL HIGH (ref 0.0–0.2)

## 2022-04-25 LAB — GLUCOSE, CAPILLARY
Glucose-Capillary: 113 mg/dL — ABNORMAL HIGH (ref 70–99)
Glucose-Capillary: 113 mg/dL — ABNORMAL HIGH (ref 70–99)
Glucose-Capillary: 117 mg/dL — ABNORMAL HIGH (ref 70–99)
Glucose-Capillary: 133 mg/dL — ABNORMAL HIGH (ref 70–99)
Glucose-Capillary: 156 mg/dL — ABNORMAL HIGH (ref 70–99)
Glucose-Capillary: 158 mg/dL — ABNORMAL HIGH (ref 70–99)

## 2022-04-25 LAB — PROCALCITONIN: Procalcitonin: 0.86 ng/mL

## 2022-04-25 MED ORDER — BUDESONIDE 0.25 MG/2ML IN SUSP
0.2500 mg | Freq: Two times a day (BID) | RESPIRATORY_TRACT | Status: DC
  Administered 2022-04-26 – 2022-05-05 (×16): 0.25 mg via RESPIRATORY_TRACT
  Filled 2022-04-25 (×19): qty 2

## 2022-04-25 NOTE — Progress Notes (Signed)
Patient ID: Daniel [name], male   DOB: [birth year], 61 y.o.   MRN: [id number] S: Patient not seen directly today given COVID-19 + status, utilizing data taken from chart +/- discussions w/ providers and staff.    O:BP (!) 162/99 (BP Location: Right Arm)   Pulse 78   Temp 97.7 F (36.5 C) (Axillary)   Resp 15   Ht '5\' 11"'$  (1.803 m)   Wt 103.5 kg   SpO2 98%   BMI 31.82 kg/m   Exam: Patient not seen directly today given COVID-19 + status, utilizing data taken from chart +/- discussions w/ providers and staff.    OP HD: MWF [place] 4h  400/A1.5    98.8kg   2K/2Ca bat   LUE AVF  Heparin none - Hectoral 69mg IV q HD - Appears to get IV iron with oncology, no ESA d/t cancer   Assessment/Plan:  Acute hypoxic respiratory failure - following large volume emesis of blood and intubated in the ED.  Self-extubated 04/17/22, plan per PCCM.  ABLA - acute on chronic with evidence of GI bleed.  Known AVM's.  Gi consulted. On octreotide and protonix.  Transfuse prn.  Covid-19 Positive - without hypoxia.  Quarantined for 5 days per protocol.  per pmd  ESRD - usual HD MWF. Had HD here x 3. Had HD wed. HD today to get back on schedule if staffing permits.    Hypertension/volume  - last CXR 12/23 diffuse bilat infiltrates. Not sure vol vs aspiration pna vs other. At dry wt, no edema on exam but BP's have been up. Increasing UF w/ 3.5 L off 12/27, will run max UF w/ next HD as well. However, prognosis still seems poor.   Anemia  - Hb 6.7, prbc's x 2 ordered for today, give w/ HD  Metabolic bone disease -  continue with meds  Nutrition - renal diet  Medical noncompliance - multiple admissions with ongoing issue  Alcohol abuse disorder - high risk for withdrawal, per primary  Hepatocellular carcinoma - s/p Y90 on 11/28/21.   RKelly Splinter MD 04/25/2022, 4:52 PM  Recent Labs  Lab 04/19/22 0640 04/19/22 1725 04/21/22 [id number]/26/23 0620 04/23/22 0520 04/23/22 0639 04/24/22 0332 04/25/22 1455  HGB  7.7*   < > 7.3* 7.1*   < >  --  8.6* 9.5*  ALBUMIN 1.7*  --   --  1.7*  --   --   --   --   CALCIUM 7.9*  --  8.6* 8.6*  --  9.0 8.7*  --   PHOS 7.3*   < > 3.5  --   --   --  2.7  --   CREATININE 7.04*  --  6.53* 7.89*  --  8.69* 5.06*  --   K 4.1  --  4.1 4.5  --  5.3* 4.3  --    < > = values in this interval not displayed.     Inpatient medications:  amLODipine  5 mg Per Tube Daily   arformoterol  15 mcg Nebulization BID   budesonide (PULMICORT) nebulizer solution  0.25 mg Nebulization BID   calcium carbonate (dosed in mg elemental calcium)  500 mg of elemental calcium Per Tube TID   chlordiazePOXIDE  10 mg Per Tube TID   Followed by   [Derrill MemoON 04/26/2022] chlordiazePOXIDE  10 mg Per Tube BID   Followed by   [Derrill MemoON 04/28/2022] chlordiazePOXIDE  5 mg Per Tube BID   Chlorhexidine Gluconate Cloth  6  each Topical Q0600   dexamethasone  6 mg Per Tube Daily   feeding supplement (PROSource TF20)  60 mL Per Tube Daily   folic acid  1 mg Intravenous Daily   heparin injection (subcutaneous)  5,000 Units Subcutaneous Q8H   insulin aspart  1-3 Units Subcutaneous Q4H   lactulose  10 g Per Tube QID   multivitamin  1 tablet Per Tube QHS   mouth rinse  15 mL Mouth Rinse 4 times per day   pantoprazole  40 mg Intravenous Q12H   revefenacin  175 mcg Nebulization Daily    anticoagulant sodium citrate     feeding supplement (VITAL 1.5 CAL) 1,000 mL (04/25/22 1614)   acetaminophen **OR** acetaminophen, albuterol, alteplase, anticoagulant sodium citrate, docusate sodium, guaiFENesin-dextromethorphan, heparin, hydrALAZINE, lidocaine (PF), lidocaine-prilocaine, ondansetron **OR** ondansetron (ZOFRAN) IV, mouth rinse, pentafluoroprop-tetrafluoroeth, polyethylene glycol

## 2022-04-25 NOTE — Progress Notes (Signed)
Received patient in bed to unit.  Alert to self  Informed consent signed and in chart.   Treatment initiated: 1740 Treatment completed: 2033  Patient tolerated well.  Transported back to the room  Alert, without acute distress.  Hand-off given to patient's nurse.   Access used: R AVF Access issues: None  Total UF removed: 4L Medication(s) given: none Post HD VS: T- 97.2, BP 141/79, P 82, O2 96 8L HFNC R 19 Post HD weight: 99.5kgs   [facility]

## 2022-04-25 NOTE — Care Management Important Message (Signed)
Important Message  Patient Details  Name: Daniel [name] MRN: [id number] Date of Birth: Jun 22, 1960   Medicare Important Message Given:  Yes     Shelda Altes 04/25/2022, 10:00 AM

## 2022-04-25 NOTE — Progress Notes (Signed)
PROGRESS NOTE    Daniel [name]  WUJ:[id number] DOB: 17-Feb-1961 DOA: 04/14/2022 PCP: [name], NP  Outpatient Specialists:     Brief Narrative:  As per prior documentation: "this is a 61 year old gentleman, past medical history of peripheral arterial disease, dialysis patient Monday, end-stage renal disease, type 2 diabetes hepatocellular carcinoma status post radiation. seen in pulm clinic before for a lung nodule which on bronchoscopic eval / path was not c/w malignancy .  He has had GI bleeding in the past with 2 duodenal ulcers a AVM alcoholic cirrhosis with his known history hepatocellular carcinoma.  In the ER he was also found to be COVID-positive.   He ultimately had a large volume of bloody emesis in the emergency room he had respiratory failure and was intubated.  Pulmonary critical care was consulted for ICU was consulted for admission.  He had already been seen by hospitalist and admitted with GI consultation and IR but declined while he was in theEmergency department".   Significant Hospital Events:  12/17 covid + with generalized body sx, left ama 12/18 admitted with GIB. Intubated for hematemesis 12/19 starting on covid therapies  12/21 extubated 12/23 HD 12/24 IPAL discussion.  DNR/DNI 12/26 added decadron for increased WOB 12/28 got 2 units of blood on the 27thHemoglobin stable   04/25/2022: Patient seen.  No history from patient.  Patient is unable to give any history.  Transferred to [facility] today.   Assessment & Plan:   Principal Problem:   GIB (gastrointestinal bleeding) Active Problems:   Alcohol abuse   Thrombocytopenia (HCC)   Peptic ulcer disease   ESRD (end stage renal disease) (HCC)   Diabetes mellitus type 2, controlled, with complications ([place])   Physical deconditioning   Iron deficiency anemia due to chronic blood loss   ABLA (acute blood loss anemia)   Acute on chronic anemia   Cirrhosis of liver with ascites (HCC)   Symptomatic  anemia   Pneumonia due to COVID-19 virus   Acute on chronic respiratory failure (HCC)   Protein-calorie malnutrition, severe   Acute encephalopathy   Acute blood loss anemia due to upper GI bleed- Cirrhosis w/ HCC and 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   Acute respiratory failure with hypoxia Covid infection with pneumonia +pulmonary edema -extubated 12/21 Plan Continuing supplemental oxygen  Continue bronchodilators  Nurse Pulmicort. Patient continues to wheeze. Patient is on dexamethasone. Volume removal with dialysis  Respiratory precautions x 14 days minimal, extended 21 if remains significantly hypoxic  DNR/DNI     ESRD on iHD, poorly compliant as an outpatient Fluid electrolyte imbalance: Hyponatremia, hyperkalemia, hyperphosphatemia Plan Nephrology is managing.   Thrombocytopenia-chronic due to cirrhosis. Likely acutely dropping 2/2 antibiotics or sepsis, this is improving Plan Continue to monitor   Hypertension Plan Continuing amlodipine  As needed hydralazine for systolic blood pressure greater than 160  Holding labetalol given poorly controlled bronchospasm on admission      Severe protein calorie malnutrition Alcohol abuse Plan CIWA protocol  Thiamine  Tube feeds via cor track      Acute metabolic encephalopathy,  concern for ETOH withdrawal Plan Continuing supportive care Lactulose Holding antipsychotics given history of tardive dyskinesia    DVT prophylaxis: Subcutaneous heparin Code Status: DO NOT RESUSCITATE Family Communication:  Disposition Plan: Patient remains inpatient.   Consultants:  Transfer from ICU team to [facility]    Procedures:  Intubation and extubation  Antimicrobials:  None   Subjective: No history from patient.  Objective: Vitals:   04/25/22 0800 04/25/22 0816 04/25/22 0831 04/25/22 1000  BP: (!) 180/103    (!) 151/93  Pulse: 81 76  71  Resp: '20 13  13  '$ Temp: 98.6 F (37 C)     TempSrc: Oral     SpO2: 100% 100% 100% 98%  Weight:      Height:        Intake/Output Summary (Last 24 hours) at 04/25/2022 1248 Last data filed at 04/25/2022 0700 Gross per 24 hour  Intake 910 ml  Output 250 ml  Net 660 ml   Filed Weights   04/23/22 1805 04/24/22 0500 04/25/22 0426  Weight: 95.3 kg 98.1 kg 103.5 kg    Examination:  General exam: Lethargic.  No history from patient.    Respiratory system: Decreased air entry with expiratory wheeze.  Cardiovascular system: S1 & S2 heard Gastrointestinal system: Abdomen is obese, soft and nontender.   Central nervous system:.  Lethargic.   Extremities: Status post left BKA.   Data Reviewed: I have personally reviewed following labs and imaging studies  CBC: Recent Labs  Lab 04/20/22 0632 04/21/22 0658 04/22/22 0620 04/23/22 0520 04/24/22 0332  WBC 3.1* 4.1 5.0 6.2 7.7  NEUTROABS  --   --  3.2  --   --   HGB 7.4* 7.3* 7.1* 6.7* 8.6*  HCT 22.9* 22.8* 21.2* 21.3* 25.2*  MCV 100.4* 100.9* 97.7 102.4* 95.1  PLT 63* 68* 73* 82* 91*   Basic Metabolic Panel: Recent Labs  Lab 04/19/22 0640 04/19/22 1725 04/20/22 0632 04/20/22 1801 04/21/22 0658 04/22/22 0620 04/23/22 0639 04/24/22 0332  NA 132*  --   --   --  136 135 133* 134*  K 4.1  --   --   --  4.1 4.5 5.3* 4.3  CL 95*  --   --   --  101 100 99 95*  CO2 24  --   --   --  '23 24 22 27  '$ GLUCOSE 113*  --   --   --  123* 129* 161* 126*  BUN 44*  --   --   --  37* 46* 58* 35*  CREATININE 7.04*  --   --   --  6.53* 7.89* 8.69* 5.06*  CALCIUM 7.9*  --   --   --  8.6* 8.6* 9.0 8.7*  MG 1.9 1.6* 2.8* 2.1 2.2  --   --  2.0  PHOS 7.3* 3.7 4.0 3.4 3.5  --   --  2.7   GFR: Estimated Creatinine Clearance: 18.5 mL/min (A) (by C-G formula based on SCr of 5.06 mg/dL (H)). Liver Function Tests: Recent Labs  Lab 04/19/22 0640 04/22/22 0620  AST 66* 56*  ALT 20 16  ALKPHOS 70 66  BILITOT  1.1 0.8  PROT 6.9 7.3  ALBUMIN 1.7* 1.7*   No results for input(s): "LIPASE", "AMYLASE" in the last 168 hours. Recent Labs  Lab 04/19/22 0640  AMMONIA 43*   Coagulation Profile: No results for input(s): "INR", "PROTIME" in the last 168 hours. Cardiac Enzymes: No results for input(s): "CKTOTAL", "CKMB", "CKMBINDEX", "TROPONINI" in the last 168 hours. BNP (last 3 results) No results for input(s): "PROBNP" in the last 8760 hours. HbA1C: No results for input(s): "HGBA1C" in the last 72 hours. CBG: Recent Labs  Lab 04/24/22 2032 04/25/22 0042 04/25/22 0425 04/25/22 0929 04/25/22 0932  GLUCAP 189* 117* 156* 113* 113*   Lipid Profile: No  results for input(s): "CHOL", "HDL", "LDLCALC", "TRIG", "CHOLHDL", "LDLDIRECT" in the last 72 hours. Thyroid Function Tests: No results for input(s): "TSH", "T4TOTAL", "FREET4", "T3FREE", "THYROIDAB" in the last 72 hours. Anemia Panel: No results for input(s): "VITAMINB12", "FOLATE", "FERRITIN", "TIBC", "IRON", "RETICCTPCT" in the last 72 hours. Urine analysis:    Component Value Date/Time   COLORURINE YELLOW 12/09/2021 0900   APPEARANCEUR CLEAR 12/09/2021 0900   LABSPEC 1.008 12/09/2021 0900   PHURINE 5.0 12/09/2021 0900   GLUCOSEU NEGATIVE 12/09/2021 0900   HGBUR MODERATE (A) 12/09/2021 0900   BILIRUBINUR NEGATIVE 12/09/2021 0900   KETONESUR NEGATIVE 12/09/2021 0900   PROTEINUR 100 (A) 12/09/2021 0900   UROBILINOGEN 0.2 12/31/2009 0746   NITRITE NEGATIVE 12/09/2021 0900   LEUKOCYTESUR NEGATIVE 12/09/2021 0900   Sepsis Labs: '@LABRCNTIP'$ (procalcitonin:4,lacticidven:4)  ) Recent Results (from the past 240 hour(s))  Culture, Respiratory w Gram Stain     Status: None   Collection Time: 04/15/22  4:22 PM   Specimen: Tracheal Aspirate; Respiratory  Result Value Ref Range Status   Specimen Description TRACHEAL ASPIRATE  Final   Special Requests NONE  Final   Gram Stain NO WBC SEEN NO ORGANISMS SEEN   Final   Culture   Final    RARE  Normal respiratory flora-no Staph aureus or Pseudomonas seen Performed at [facility], [address], [place], [place] [address]    Report Status 04/18/2022 FINAL  Final  MRSA Next Gen by PCR, Nasal     Status: None   Collection Time: 04/15/22  5:01 PM   Specimen: Nasal Mucosa; Nasal Swab  Result Value Ref Range Status   MRSA by PCR Next Gen NOT DETECTED NOT DETECTED Final    Comment: (NOTE) The GeneXpert MRSA Assay (FDA approved for NASAL specimens only), is one component of a comprehensive MRSA colonization surveillance program. It is not intended to diagnose MRSA infection nor to guide or monitor treatment for MRSA infections. Test performance is not FDA approved in patients less than 69 years old. Performed at [facility], [place] [address] [place], [place] [address]          Radiology Studies: DG Chest Port 1 View  Result Date: 04/24/2022 CLINICAL DATA:  Pneumonia. EXAM: PORTABLE CHEST 1 VIEW COMPARISON:  Radiographs 04/19/2022 and 04/14/2022.  CT 12/25/2021. FINDINGS: 0937 hours. Patient is rotated to the right. Allowing for this, the heart size and mediastinal contours are stable. The heart appears mildly enlarged. A feeding tube is in place, projecting below the diaphragm, tip not visualized. Diffuse bilateral airspace opacities demonstrate interval improvement on the right. There are probable small bilateral pleural effusions. No evidence of pneumothorax or acute osseous abnormality. Telemetry leads overlie the chest. IMPRESSION: Interval improvement in right lung aeration with persistent diffuse bilateral airspace opacities. Probable small bilateral pleural effusions. Electronically Signed   By: Richardean Sale M.D.   On: 04/24/2022 09:51        Scheduled Meds:  amLODipine  5 mg Per Tube Daily   arformoterol  15 mcg Nebulization BID   calcium carbonate (dosed in mg elemental calcium)  500 mg of elemental calcium Per Tube TID   chlordiazePOXIDE  10 mg Per  Tube TID   Followed by   Derrill Memo ON 04/26/2022] chlordiazePOXIDE  10 mg Per Tube BID   Followed by   Derrill Memo ON 04/28/2022] chlordiazePOXIDE  5 mg Per Tube BID   Chlorhexidine Gluconate Cloth  6 each Topical Q0600   dexamethasone  6 mg Per Tube  Daily   feeding supplement (PROSource TF20)  60 mL Per Tube Daily   folic acid  1 mg Intravenous Daily   heparin injection (subcutaneous)  5,000 Units Subcutaneous Q8H   insulin aspart  1-3 Units Subcutaneous Q4H   lactulose  10 g Per Tube QID   multivitamin  1 tablet Per Tube QHS   mouth rinse  15 mL Mouth Rinse 4 times per day   pantoprazole  40 mg Intravenous Q12H   revefenacin  175 mcg Nebulization Daily   Continuous Infusions:  anticoagulant sodium citrate     feeding supplement (VITAL 1.5 CAL) 65 mL/hr at 04/25/22 0700     LOS: 11 days    Time spent: 55 minutes.    Dana Allan, MD  [facility] Pager #: [phone] 7PM-7AM contact night coverage as above

## 2022-04-25 NOTE — Procedures (Signed)
HD Note:  Some information was entered later than the data was gathered due to patient care needs. The stated time with the data is accurate.  Patient was dialyzed in [facility] 05 Informed consent signed and in chart.  Patient was oriented to self.  He repeatedly stated he was hungry.    Access used: Left upper arm fistula    [facility]

## 2022-04-26 DIAGNOSIS — K922 Gastrointestinal hemorrhage, unspecified: Secondary | ICD-10-CM | POA: Diagnosis not present

## 2022-04-26 DIAGNOSIS — Z66 Do not resuscitate: Secondary | ICD-10-CM | POA: Diagnosis not present

## 2022-04-26 DIAGNOSIS — Z515 Encounter for palliative care: Secondary | ICD-10-CM | POA: Diagnosis not present

## 2022-04-26 DIAGNOSIS — K31811 Angiodysplasia of stomach and duodenum with bleeding: Secondary | ICD-10-CM | POA: Diagnosis not present

## 2022-04-26 DIAGNOSIS — E43 Unspecified severe protein-calorie malnutrition: Secondary | ICD-10-CM | POA: Diagnosis not present

## 2022-04-26 LAB — RENAL FUNCTION PANEL
Albumin: 1.9 g/dL — ABNORMAL LOW (ref 3.5–5.0)
Anion gap: 13 (ref 5–15)
BUN: 40 mg/dL — ABNORMAL HIGH (ref 8–23)
CO2: 28 mmol/L (ref 22–32)
Calcium: 8.9 mg/dL (ref 8.9–10.3)
Chloride: 95 mmol/L — ABNORMAL LOW (ref 98–111)
Creatinine, Ser: 4.36 mg/dL — ABNORMAL HIGH (ref 0.61–1.24)
GFR, Estimated: 15 mL/min — ABNORMAL LOW (ref >60)
Glucose, Bld: 128 mg/dL — ABNORMAL HIGH (ref 70–99)
Phosphorus: 3 mg/dL (ref 2.5–4.6)
Potassium: 4.2 mmol/L (ref 3.5–5.1)
Sodium: 136 mmol/L (ref 135–145)

## 2022-04-26 LAB — GLUCOSE, CAPILLARY
Glucose-Capillary: 117 mg/dL — ABNORMAL HIGH (ref 70–99)
Glucose-Capillary: 130 mg/dL — ABNORMAL HIGH (ref 70–99)
Glucose-Capillary: 136 mg/dL — ABNORMAL HIGH (ref 70–99)
Glucose-Capillary: 137 mg/dL — ABNORMAL HIGH (ref 70–99)
Glucose-Capillary: 138 mg/dL — ABNORMAL HIGH (ref 70–99)
Glucose-Capillary: 150 mg/dL — ABNORMAL HIGH (ref 70–99)
Glucose-Capillary: 161 mg/dL — ABNORMAL HIGH (ref 70–99)

## 2022-04-26 LAB — CBC WITH DIFFERENTIAL/PLATELET
Abs Immature Granulocytes: 0.04 10*3/uL (ref 0.00–0.07)
Basophils Absolute: 0 10*3/uL (ref 0.0–0.1)
Basophils Relative: 0 %
Eosinophils Absolute: 0 10*3/uL (ref 0.0–0.5)
Eosinophils Relative: 0 %
HCT: 30 % — ABNORMAL LOW (ref 39.0–52.0)
Hemoglobin: 10.2 g/dL — ABNORMAL LOW (ref 13.0–17.0)
Immature Granulocytes: 1 %
Lymphocytes Relative: 8 %
Lymphs Abs: 0.5 10*3/uL — ABNORMAL LOW (ref 0.7–4.0)
MCH: 32.7 pg (ref 26.0–34.0)
MCHC: 34 g/dL (ref 30.0–36.0)
MCV: 96.2 fL (ref 80.0–100.0)
Monocytes Absolute: 0.8 10*3/uL (ref 0.1–1.0)
Monocytes Relative: 12 %
Neutro Abs: 5.2 10*3/uL (ref 1.7–7.7)
Neutrophils Relative %: 79 %
Platelets: 99 10*3/uL — ABNORMAL LOW (ref 150–400)
RBC: 3.12 MIL/uL — ABNORMAL LOW (ref 4.22–5.81)
RDW: 17.7 % — ABNORMAL HIGH (ref 11.5–15.5)
WBC: 6.6 10*3/uL (ref 4.0–10.5)
nRBC: 0.3 % — ABNORMAL HIGH (ref 0.0–0.2)

## 2022-04-26 LAB — PROCALCITONIN: Procalcitonin: 0.66 ng/mL

## 2022-04-26 LAB — MAGNESIUM: Magnesium: 1.9 mg/dL (ref 1.7–2.4)

## 2022-04-26 MED ORDER — CALCIUM CARBONATE ANTACID 1250 MG/5ML PO SUSP
500.0000 mg | Freq: Two times a day (BID) | ORAL | Status: DC
  Administered 2022-04-26 – 2022-04-29 (×7): 500 mg
  Filled 2022-04-26 (×9): qty 5

## 2022-04-26 MED ORDER — HALOPERIDOL LACTATE 5 MG/ML IJ SOLN
0.5000 mg | Freq: Four times a day (QID) | INTRAMUSCULAR | Status: AC | PRN
  Administered 2022-04-27 – 2022-04-29 (×3): 0.5 mg via INTRAVENOUS
  Filled 2022-04-26 (×3): qty 1

## 2022-04-26 MED ORDER — CHLORHEXIDINE GLUCONATE CLOTH 2 % EX PADS
6.0000 | MEDICATED_PAD | Freq: Every day | CUTANEOUS | Status: DC
  Administered 2022-04-27 – 2022-04-29 (×3): 6 via TOPICAL

## 2022-04-26 NOTE — Progress Notes (Addendum)
[facility] Progress Note   Subjective:   Had HD last night with 4L UF, still on HFNC but O2 sat is 100%. Looks at me and says "ok" but not really answering ROS questions.   Objective Vitals:   04/26/22 0414 04/26/22 0500 04/26/22 0735 04/26/22 0812  BP: (!) 154/85  (!) 156/85   Pulse: 78  77 78  Resp: '17  14 18  '$ Temp:   (!) 96.9 F (36.1 C)   TempSrc: Oral  Axillary   SpO2: 98%  100% 97%  Weight:  101.5 kg    Height:       Physical Exam General: Alert male in NAD Heart: RRR, no murmurs, rubs or gallops Lungs: CTA bilaterally without wheezing, rhonchi or rales. On O2 via HFNC Abdomen: Soft, non-distended, +BS Extremities: No edema b/l lower extremities Dialysis Access: RUE AVF +bruit  Additional Objective Labs: Basic Metabolic Panel: Recent Labs  Lab 04/21/22 0658 04/22/22 0620 04/23/22 0639 04/24/22 0332 04/26/22 0113  NA 136   < > 133* 134* 136  K 4.1   < > 5.3* 4.3 4.2  CL 101   < > 99 95* 95*  CO2 23   < > '22 27 28  '$ GLUCOSE 123*   < > 161* 126* 128*  BUN 37*   < > 58* 35* 40*  CREATININE 6.53*   < > 8.69* 5.06* 4.36*  CALCIUM 8.6*   < > 9.0 8.7* 8.9  PHOS 3.5  --   --  2.7 3.0   < > = values in this interval not displayed.   Liver Function Tests: Recent Labs  Lab 04/22/22 0620 04/26/22 0113  AST 56*  --   ALT 16  --   ALKPHOS 66  --   BILITOT 0.8  --   PROT 7.3  --   ALBUMIN 1.7* 1.9*   No results for input(s): "LIPASE", "AMYLASE" in the last 168 hours. CBC: Recent Labs  Lab 04/22/22 0620 04/23/22 0520 04/24/22 0332 04/25/22 1455 04/26/22 0113  WBC 5.0 6.2 7.7 7.9 6.6  NEUTROABS 3.2  --   --  5.7 5.2  HGB 7.1* 6.7* 8.6* 9.5* 10.2*  HCT 21.2* 21.3* 25.2* 27.9* 30.0*  MCV 97.7 102.4* 95.1 96.2 96.2  PLT 73* 82* 91* 79* 99*   Blood Culture    Component Value Date/Time   SDES TRACHEAL ASPIRATE 04/15/2022 1622   SPECREQUEST NONE 04/15/2022 1622   CULT  04/15/2022 1622    RARE Normal respiratory flora-no Staph aureus or  Pseudomonas seen Performed at [facility] [address], [place], [place] [address]    REPTSTATUS 04/18/2022 FINAL 04/15/2022 1622    Cardiac Enzymes: No results for input(s): "CKTOTAL", "CKMB", "CKMBINDEX", "TROPONINI" in the last 168 hours. CBG: Recent Labs  Lab 04/25/22 1324 04/25/22 2152 04/26/22 0005 04/26/22 0412 04/26/22 0732  GLUCAP 133* 158* 150* 136* 117*   Iron Studies: No results for input(s): "IRON", "TIBC", "TRANSFERRIN", "FERRITIN" in the last 72 hours. '@[email or web address]'$ @ Studies/Results: No results found. Medications:  anticoagulant sodium citrate     feeding supplement (VITAL 1.5 CAL) 1,000 mL (04/26/22 0824)    amLODipine  5 mg Per Tube Daily   arformoterol  15 mcg Nebulization BID   budesonide (PULMICORT) nebulizer solution  0.25 mg Nebulization BID   calcium carbonate (dosed in mg elemental calcium)  500 mg of elemental calcium Per Tube TID   chlordiazePOXIDE  10 mg Per Tube TID   Followed by   chlordiazePOXIDE  10 mg Per Tube BID   Followed by   Derrill Memo ON 04/28/2022] chlordiazePOXIDE  5 mg Per Tube BID   Chlorhexidine Gluconate Cloth  6 each Topical Q0600   dexamethasone  6 mg Per Tube Daily   feeding supplement (PROSource TF20)  60 mL Per Tube Daily   folic acid  1 mg Intravenous Daily   heparin injection (subcutaneous)  5,000 Units Subcutaneous Q8H   insulin aspart  1-3 Units Subcutaneous Q4H   lactulose  10 g Per Tube QID   multivitamin  1 tablet Per Tube QHS   mouth rinse  15 mL Mouth Rinse 4 times per day   pantoprazole  40 mg Intravenous Q12H   revefenacin  175 mcg Nebulization Daily    OP Dialysis Orders:  MWF [place] 4h  400/A1.5    98.8kg   2K/2Ca bat   LUE AVF  Heparin none - Hectoral 69mg IV q HD - Appears to get IV iron with oncology, no ESA d/t cancer  Assessment/Plan:  Acute hypoxic respiratory failure - following large volume emesis of blood and intubated in the ED.  Self-extubated 04/17/22, plan per PCCM.  ABLA - acute on  chronic with evidence of GI bleed.  Known AVM's.  Gi consulted. On octreotide and protonix.  Transfuse prn.  Covid-19 Positive -  Quarantined for 5 days per protocol. On supplemental O2 but has been gradually tapered down. per pmd  ESRD - Typically on  HD MWF. Had HD Friday with net UF 4L. Next HD Sunday due to holiday, then resume MWF schedule.  Hypertension/volume  - last CXR 12/23 diffuse bilat infiltrates. Not sure vol vs aspiration pna vs other. No edema on exam but BP's have been up. Tolerated 4L UF with HD, will continue max UF goals as tolerated.   Anemia  - Multiple transfusions this admission, Hgb 10.2 today. Not on ESA due to hepatic cancer.   Metabolic bone disease -  Corrected calcium 10.6. VDRA on hold. Phosphorus is at goal. Changing calcium carbonate from TID to BID today  Nutrition - has NG tube  Medical noncompliance - multiple admissions with ongoing issue  Hepatocellular carcinoma - s/p Y90 on 11/28/21.      SAnice Paganini PA-C 04/26/2022, 10:11 AM  [facility] Pager: [phone]

## 2022-04-26 NOTE — Progress Notes (Signed)
PROGRESS NOTE    Daniel [name]  SVX:[id number] DOB: [birth year] DOA: 04/14/2022 PCP: [name], NP  Outpatient Specialists:     Brief Narrative:  As per prior documentation: "this is a 61 year old gentleman, past medical history of peripheral arterial disease, dialysis patient Monday, end-stage renal disease, type 2 diabetes hepatocellular carcinoma status post radiation. seen in pulm clinic before for a lung nodule which on bronchoscopic eval / path was not c/w malignancy .  He has had GI bleeding in the past with 2 duodenal ulcers a AVM alcoholic cirrhosis with his known history hepatocellular carcinoma.  In the ER he was also found to be COVID-positive.   He ultimately had a large volume of bloody emesis in the emergency room he had respiratory failure and was intubated.  Pulmonary critical care was consulted for ICU was consulted for admission.  He had already been seen by hospitalist and admitted with GI consultation and IR but declined while he was in theEmergency department".   Significant Hospital Events:  12/17 covid + with generalized body sx, left ama 12/18 admitted with GIB. Intubated for hematemesis 12/19 starting on covid therapies  12/21 extubated 12/23 HD 12/24 IPAL discussion.  DNR/DNI 12/26 added decadron for increased WOB 12/28 got 2 units of blood on the 27thHemoglobin stable   04/25/2022: Patient seen.  No history from patient.  Patient is unable to give any history.  Transferred to [facility] today. 04/26/2022: Patient seen.  Patient is more interactive today, however, encephalopathy persists.  Nursing staff report intermittent episodes of difficulty managing patient.  Will avoid benzodiazepines in this patient.  Will use very low-dose haloperidol.  Patient is on telemetry monitoring.  Will also get an EKG.     Assessment & Plan:   Principal Problem:   GIB (gastrointestinal bleeding) Active Problems:   Alcohol abuse   Thrombocytopenia (HCC)   Peptic  ulcer disease   ESRD (end stage renal disease) (HCC)   Diabetes mellitus type 2, controlled, with complications ([place])   Physical deconditioning   Iron deficiency anemia due to chronic blood loss   ABLA (acute blood loss anemia)   Acute on chronic anemia   Cirrhosis of liver with ascites (HCC)   Symptomatic anemia   Pneumonia due to COVID-19 virus   Acute on chronic respiratory failure (HCC)   Protein-calorie malnutrition, severe   Acute encephalopathy   Acute blood loss anemia due to upper GI bleed- Cirrhosis w/ HCC and 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 04/26/2022: H/H stable.  Hemoglobin is 10.2 g/dL today.   Acute respiratory failure with hypoxia Covid infection with pneumonia +pulmonary edema -extubated 12/21 Plan Continuing supplemental oxygen  Continue bronchodilators  Nurse Pulmicort. Patient continues to wheeze. Patient is on dexamethasone. Volume removal with dialysis  Respiratory precautions x 14 days minimal, extended 21 if remains significantly hypoxic  DNR/DNI  04/26/2022: Complete 10-day course of oral dexamethasone.  Respiratory status seems to be improving.    ESRD on iHD, poorly compliant as an outpatient Fluid electrolyte imbalance: Hyponatremia, hyperkalemia, hyperphosphatemia Plan Nephrology is managing.   Thrombocytopenia-chronic due to cirrhosis. Likely acutely dropping 2/2 antibiotics or sepsis, this is improving Plan Continue to monitor 04/26/2022: Platelet count is 99 today.   Hypertension Plan Continuing amlodipine  As needed hydralazine for systolic blood pressure greater than 160  Holding labetalol given poorly controlled bronchospasm on admission      Severe protein calorie malnutrition  Alcohol abuse Plan CIWA protocol  Thiamine  Tube feeds via cor track      Acute metabolic encephalopathy,  concern for ETOH  withdrawal Plan Continuing supportive care Lactulose   DVT prophylaxis: Subcutaneous heparin Code Status: DO NOT RESUSCITATE Family Communication:  Disposition Plan: Patient remains inpatient.   Consultants:  Transfer from ICU team to [facility]    Procedures:  Intubation and extubation  Antimicrobials:  None   Subjective: No significant history from patient, however, patient is more interactive.  Objective: Vitals:   04/26/22 0500 04/26/22 0735 04/26/22 0812 04/26/22 1238  BP:  (!) 156/85  (!) 161/74  Pulse:  77 78 75  Resp:  '14 18 18  '$ Temp:  (!) 96.9 F (36.1 C)  (!) 97.5 F (36.4 C)  TempSrc:  Axillary  Oral  SpO2:  100% 97% 99%  Weight: 101.5 kg     Height:        Intake/Output Summary (Last 24 hours) at 04/26/2022 1713 Last data filed at 04/26/2022 1242 Gross per 24 hour  Intake --  Output 4100 ml  Net -4100 ml    Filed Weights   04/25/22 0426 04/25/22 2109 04/26/22 0500  Weight: 103.5 kg 99.5 kg 101.5 kg    Examination:  General exam: Awake.  More interactive.  No significant history.  No history from patient.    Respiratory system: Decreased air entry with expiratory wheeze.  Cardiovascular system: S1 & S2 heard Gastrointestinal system: Abdomen is obese, soft and nontender.   Central nervous system:.  Awake.  More interactive.   Extremities: Status post left BKA.   Data Reviewed: I have personally reviewed following labs and imaging studies  CBC: Recent Labs  Lab 04/22/22 0620 04/23/22 0520 04/24/22 0332 04/25/22 1455 04/26/22 0113  WBC 5.0 6.2 7.7 7.9 6.6  NEUTROABS 3.2  --   --  5.7 5.2  HGB 7.1* 6.7* 8.6* 9.5* 10.2*  HCT 21.2* 21.3* 25.2* 27.9* 30.0*  MCV 97.7 102.4* 95.1 96.2 96.2  PLT 73* 82* 91* 79* 99*    Basic Metabolic Panel: Recent Labs  Lab 04/20/22 0632 04/20/22 1801 04/21/22 0658 04/22/22 0620 04/23/22 0639 04/24/22 0332 04/26/22 0113  NA  --   --  136 135 133* 134* 136  K  --   --  4.1 4.5 5.3* 4.3 4.2  CL  --    --  101 100 99 95* 95*  CO2  --   --  '23 24 22 27 28  '$ GLUCOSE  --   --  123* 129* 161* 126* 128*  BUN  --   --  37* 46* 58* 35* 40*  CREATININE  --   --  6.53* 7.89* 8.69* 5.06* 4.36*  CALCIUM  --   --  8.6* 8.6* 9.0 8.7* 8.9  MG 2.8* 2.1 2.2  --   --  2.0 1.9  PHOS 4.0 3.4 3.5  --   --  2.7 3.0    GFR: Estimated Creatinine Clearance: 21.3 mL/min (A) (by C-G formula based on SCr of 4.36 mg/dL (H)). Liver Function Tests: Recent Labs  Lab 04/22/22 0620 04/26/22 0113  AST 56*  --   ALT 16  --   ALKPHOS 66  --   BILITOT 0.8  --   PROT 7.3  --   ALBUMIN 1.7* 1.9*    No results for input(s): "LIPASE", "AMYLASE" in the last 168 hours. No results for input(s): "AMMONIA" in the last 168 hours.  Coagulation Profile: No results for input(s): "  INR", "PROTIME" in the last 168 hours. Cardiac Enzymes: No results for input(s): "CKTOTAL", "CKMB", "CKMBINDEX", "TROPONINI" in the last 168 hours. BNP (last 3 results) No results for input(s): "PROBNP" in the last 8760 hours. HbA1C: No results for input(s): "HGBA1C" in the last 72 hours. CBG: Recent Labs  Lab 04/25/22 2152 04/26/22 0005 04/26/22 0412 04/26/22 0732 04/26/22 1233  GLUCAP 158* 150* 136* 117* 130*    Lipid Profile: No results for input(s): "CHOL", "HDL", "LDLCALC", "TRIG", "CHOLHDL", "LDLDIRECT" in the last 72 hours. Thyroid Function Tests: No results for input(s): "TSH", "T4TOTAL", "FREET4", "T3FREE", "THYROIDAB" in the last 72 hours. Anemia Panel: No results for input(s): "VITAMINB12", "FOLATE", "FERRITIN", "TIBC", "IRON", "RETICCTPCT" in the last 72 hours. Urine analysis:    Component Value Date/Time   COLORURINE YELLOW 12/09/2021 0900   APPEARANCEUR CLEAR 12/09/2021 0900   LABSPEC 1.008 12/09/2021 0900   PHURINE 5.0 12/09/2021 0900   GLUCOSEU NEGATIVE 12/09/2021 0900   HGBUR MODERATE (A) 12/09/2021 0900   BILIRUBINUR NEGATIVE 12/09/2021 0900   KETONESUR NEGATIVE 12/09/2021 0900   PROTEINUR 100 (A)  12/09/2021 0900   UROBILINOGEN 0.2 12/31/2009 0746   NITRITE NEGATIVE 12/09/2021 0900   LEUKOCYTESUR NEGATIVE 12/09/2021 0900   Sepsis Labs: '@LABRCNTIP'$ (procalcitonin:4,lacticidven:4)  ) No results found for this or any previous visit (from the past 240 hour(s)).        Radiology Studies: No results found.      Scheduled Meds:  amLODipine  5 mg Per Tube Daily   arformoterol  15 mcg Nebulization BID   budesonide (PULMICORT) nebulizer solution  0.25 mg Nebulization BID   calcium carbonate (dosed in mg elemental calcium)  500 mg of elemental calcium Per Tube BID   chlordiazePOXIDE  10 mg Per Tube BID   Followed by   Derrill Memo ON 04/28/2022] chlordiazePOXIDE  5 mg Per Tube BID   [START ON 04/27/2022] Chlorhexidine Gluconate Cloth  6 each Topical Q0600   dexamethasone  6 mg Per Tube Daily   feeding supplement (PROSource TF20)  60 mL Per Tube Daily   folic acid  1 mg Intravenous Daily   heparin injection (subcutaneous)  5,000 Units Subcutaneous Q8H   insulin aspart  1-3 Units Subcutaneous Q4H   lactulose  10 g Per Tube QID   multivitamin  1 tablet Per Tube QHS   mouth rinse  15 mL Mouth Rinse 4 times per day   pantoprazole  40 mg Intravenous Q12H   revefenacin  175 mcg Nebulization Daily   Continuous Infusions:  anticoagulant sodium citrate     feeding supplement (VITAL 1.5 CAL) 1,000 mL (04/26/22 0824)     LOS: 12 days    Time spent: 35 minutes.    Dana Allan, MD  [facility] Pager #: [phone] 7PM-7AM contact night coverage as above

## 2022-04-27 DIAGNOSIS — K922 Gastrointestinal hemorrhage, unspecified: Secondary | ICD-10-CM | POA: Diagnosis not present

## 2022-04-27 LAB — CBC WITH DIFFERENTIAL/PLATELET
Abs Immature Granulocytes: 0.05 10*3/uL (ref 0.00–0.07)
Basophils Absolute: 0 10*3/uL (ref 0.0–0.1)
Basophils Relative: 0 %
Eosinophils Absolute: 0 10*3/uL (ref 0.0–0.5)
Eosinophils Relative: 0 %
HCT: 28.7 % — ABNORMAL LOW (ref 39.0–52.0)
Hemoglobin: 9.9 g/dL — ABNORMAL LOW (ref 13.0–17.0)
Immature Granulocytes: 1 %
Lymphocytes Relative: 7 %
Lymphs Abs: 0.5 10*3/uL — ABNORMAL LOW (ref 0.7–4.0)
MCH: 33.2 pg (ref 26.0–34.0)
MCHC: 34.5 g/dL (ref 30.0–36.0)
MCV: 96.3 fL (ref 80.0–100.0)
Monocytes Absolute: 0.6 10*3/uL (ref 0.1–1.0)
Monocytes Relative: 7 %
Neutro Abs: 6.8 10*3/uL (ref 1.7–7.7)
Neutrophils Relative %: 85 %
Platelets: 106 10*3/uL — ABNORMAL LOW (ref 150–400)
RBC: 2.98 MIL/uL — ABNORMAL LOW (ref 4.22–5.81)
RDW: 17 % — ABNORMAL HIGH (ref 11.5–15.5)
WBC: 7.9 10*3/uL (ref 4.0–10.5)
nRBC: 0 % (ref 0.0–0.2)

## 2022-04-27 LAB — MAGNESIUM: Magnesium: 2.2 mg/dL (ref 1.7–2.4)

## 2022-04-27 LAB — RENAL FUNCTION PANEL
Albumin: 1.9 g/dL — ABNORMAL LOW (ref 3.5–5.0)
Anion gap: 14 (ref 5–15)
BUN: 77 mg/dL — ABNORMAL HIGH (ref 8–23)
CO2: 25 mmol/L (ref 22–32)
Calcium: 9 mg/dL (ref 8.9–10.3)
Chloride: 95 mmol/L — ABNORMAL LOW (ref 98–111)
Creatinine, Ser: 6.13 mg/dL — ABNORMAL HIGH (ref 0.61–1.24)
GFR, Estimated: 10 mL/min — ABNORMAL LOW (ref >60)
Glucose, Bld: 154 mg/dL — ABNORMAL HIGH (ref 70–99)
Phosphorus: 4.5 mg/dL (ref 2.5–4.6)
Potassium: 4.8 mmol/L (ref 3.5–5.1)
Sodium: 134 mmol/L — ABNORMAL LOW (ref 135–145)

## 2022-04-27 LAB — GLUCOSE, CAPILLARY
Glucose-Capillary: 115 mg/dL — ABNORMAL HIGH (ref 70–99)
Glucose-Capillary: 104 mg/dL — ABNORMAL HIGH (ref 70–99)
Glucose-Capillary: 183 mg/dL — ABNORMAL HIGH (ref 70–99)
Glucose-Capillary: 179 mg/dL — ABNORMAL HIGH (ref 70–99)
Glucose-Capillary: 140 mg/dL — ABNORMAL HIGH (ref 70–99)

## 2022-04-27 NOTE — Progress Notes (Signed)
PROGRESS NOTE    Daniel [name]  ZOX:[id number] DOB: Jan 24, 1961 DOA: 04/14/2022 PCP: [name], NP  Outpatient Specialists:     Brief Narrative:  As per prior documentation: "this is a 61 year old gentleman, past medical history of peripheral arterial disease, dialysis patient Monday, end-stage renal disease, type 2 diabetes hepatocellular carcinoma status post radiation. seen in pulm clinic before for a lung nodule which on bronchoscopic eval / path was not c/w malignancy .  He has had GI bleeding in the past with 2 duodenal ulcers a AVM alcoholic cirrhosis with his known history hepatocellular carcinoma.  In the ER he was also found to be COVID-positive.   He ultimately had a large volume of bloody emesis in the emergency room he had respiratory failure and was intubated.  Pulmonary critical care was consulted for ICU was consulted for admission.  He had already been seen by hospitalist and admitted with GI consultation and IR but declined while he was in theEmergency department".   Significant Hospital Events:  12/17 covid + with generalized body sx, left ama 12/18 admitted with GIB. Intubated for hematemesis 12/19 starting on covid therapies  12/21 extubated 12/23 HD 12/24 IPAL discussion.  DNR/DNI 12/26 added decadron for increased WOB 12/28 got 2 units of blood on the 27thHemoglobin stable   04/25/2022: Patient seen.  No history from patient.  Patient is unable to give any history.  Transferred to [facility] today. 04/26/2022: Patient seen.  Patient is more interactive today, however, encephalopathy persists.  Nursing staff report intermittent episodes of difficulty managing patient.  Will avoid benzodiazepines in this patient.  Will use very low-dose haloperidol.  Patient is on telemetry monitoring.  Will also get an EKG.   04/27/2022: Patient seen.  Patient continues to improve gradually.  No new changes.   Assessment & Plan:   Principal Problem:   GIB  (gastrointestinal bleeding) Active Problems:   Alcohol abuse   Thrombocytopenia (HCC)   Peptic ulcer disease   ESRD (end stage renal disease) (HCC)   Diabetes mellitus type 2, controlled, with complications ([place])   Physical deconditioning   Iron deficiency anemia due to chronic blood loss   ABLA (acute blood loss anemia)   Acute on chronic anemia   Cirrhosis of liver with ascites (HCC)   Symptomatic anemia   Pneumonia due to COVID-19 virus   Acute on chronic respiratory failure (HCC)   Protein-calorie malnutrition, severe   Acute encephalopathy   Acute blood loss anemia due to upper GI bleed- Cirrhosis w/ HCC and 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 04/26/2022: H/H stable.  Hemoglobin is 10.2 g/dL today.   Acute respiratory failure with hypoxia Covid infection with pneumonia +pulmonary edema -extubated 12/21 Plan Continuing supplemental oxygen  Continue bronchodilators  Nurse Pulmicort. Patient continues to wheeze. Patient is on dexamethasone. Volume removal with dialysis  Respiratory precautions x 14 days minimal, extended 21 if remains significantly hypoxic  DNR/DNI  04/26/2022: Complete 10-day course of oral dexamethasone.  Respiratory status seems to be improving.    ESRD on iHD, poorly compliant as an outpatient Fluid electrolyte imbalance: Hyponatremia, hyperkalemia, hyperphosphatemia Plan Nephrology is managing.   Thrombocytopenia-chronic due to cirrhosis. Likely acutely dropping 2/2 antibiotics or sepsis, this is improving Plan Continue to monitor 04/26/2022: Platelet count is 99 today.   Hypertension Plan Continuing amlodipine  As needed hydralazine for systolic blood pressure greater than 160  Holding labetalol given poorly  controlled bronchospasm on admission      Severe protein calorie malnutrition Alcohol abuse Plan CIWA protocol   Thiamine  Tube feeds via cor track      Acute metabolic encephalopathy,  concern for ETOH withdrawal Plan Continuing supportive care Lactulose   DVT prophylaxis: Subcutaneous heparin Code Status: DO NOT RESUSCITATE Family Communication:  Disposition Plan: Patient remains inpatient.   Consultants:  Transfer from ICU team to [facility]    Procedures:  Intubation and extubation  Antimicrobials:  None   Subjective: No significant history from patient Patient is slowly improving.  No new changes.   Objective: Vitals:   04/27/22 0815 04/27/22 0839 04/27/22 0841 04/27/22 0843  BP: (!) 144/85     Pulse: 77     Resp: 18     Temp: (!) 96.5 F (35.8 C)     TempSrc: Axillary     SpO2: 100% 99% 99% 99%  Weight:      Height:        Intake/Output Summary (Last 24 hours) at 04/27/2022 1229 Last data filed at 04/27/2022 1037 Gross per 24 hour  Intake 1358.17 ml  Output 1100 ml  Net 258.17 ml    Filed Weights   04/25/22 2109 04/26/22 0500 04/27/22 0401  Weight: 99.5 kg 101.5 kg 101.6 kg    Examination:  General exam: Awake.  More interactive.  No significant history.  No history from patient.    Respiratory system: Decreased air entry with expiratory wheeze.  Cardiovascular system: S1 & S2 heard Gastrointestinal system: Abdomen is obese, soft and nontender.   Central nervous system:.  Awake.  More interactive.   Extremities: Status post left BKA.   Data Reviewed: I have personally reviewed following labs and imaging studies  CBC: Recent Labs  Lab 04/22/22 0620 04/23/22 0520 04/24/22 0332 04/25/22 1455 04/26/22 0113  WBC 5.0 6.2 7.7 7.9 6.6  NEUTROABS 3.2  --   --  5.7 5.2  HGB 7.1* 6.7* 8.6* 9.5* 10.2*  HCT 21.2* 21.3* 25.2* 27.9* 30.0*  MCV 97.7 102.4* 95.1 96.2 96.2  PLT 73* 82* 91* 79* 99*    Basic Metabolic Panel: Recent Labs  Lab 04/20/22 1801 04/21/22 0658 04/22/22 0620 04/23/22 0639 04/24/22 0332 04/26/22 0113  NA  --  136 135 133* 134*  136  K  --  4.1 4.5 5.3* 4.3 4.2  CL  --  101 100 99 95* 95*  CO2  --  '23 24 22 27 28  '$ GLUCOSE  --  123* 129* 161* 126* 128*  BUN  --  37* 46* 58* 35* 40*  CREATININE  --  6.53* 7.89* 8.69* 5.06* 4.36*  CALCIUM  --  8.6* 8.6* 9.0 8.7* 8.9  MG 2.1 2.2  --   --  2.0 1.9  PHOS 3.4 3.5  --   --  2.7 3.0    GFR: Estimated Creatinine Clearance: 21.3 mL/min (A) (by C-G formula based on SCr of 4.36 mg/dL (H)). Liver Function Tests: Recent Labs  Lab 04/22/22 0620 04/26/22 0113  AST 56*  --   ALT 16  --   ALKPHOS 66  --   BILITOT 0.8  --   PROT 7.3  --   ALBUMIN 1.7* 1.9*    No results for input(s): "LIPASE", "AMYLASE" in the last 168 hours. No results for input(s): "AMMONIA" in the last 168 hours.  Coagulation Profile: No results for input(s): "INR", "PROTIME" in the last 168 hours. Cardiac Enzymes: No results for input(s): "CKTOTAL", "CKMB", "CKMBINDEX", "  TROPONINI" in the last 168 hours. BNP (last 3 results) No results for input(s): "PROBNP" in the last 8760 hours. HbA1C: No results for input(s): "HGBA1C" in the last 72 hours. CBG: Recent Labs  Lab 04/26/22 1647 04/26/22 2041 04/26/22 2322 04/27/22 0358 04/27/22 0811  GLUCAP 137* 161* 138* 115* 104*    Lipid Profile: No results for input(s): "CHOL", "HDL", "LDLCALC", "TRIG", "CHOLHDL", "LDLDIRECT" in the last 72 hours. Thyroid Function Tests: No results for input(s): "TSH", "T4TOTAL", "FREET4", "T3FREE", "THYROIDAB" in the last 72 hours. Anemia Panel: No results for input(s): "VITAMINB12", "FOLATE", "FERRITIN", "TIBC", "IRON", "RETICCTPCT" in the last 72 hours. Urine analysis:    Component Value Date/Time   COLORURINE YELLOW 12/09/2021 0900   APPEARANCEUR CLEAR 12/09/2021 0900   LABSPEC 1.008 12/09/2021 0900   PHURINE 5.0 12/09/2021 0900   GLUCOSEU NEGATIVE 12/09/2021 0900   HGBUR MODERATE (A) 12/09/2021 0900   BILIRUBINUR NEGATIVE 12/09/2021 0900   KETONESUR NEGATIVE 12/09/2021 0900   PROTEINUR 100 (A)  12/09/2021 0900   UROBILINOGEN 0.2 12/31/2009 0746   NITRITE NEGATIVE 12/09/2021 0900   LEUKOCYTESUR NEGATIVE 12/09/2021 0900   Sepsis Labs: '@LABRCNTIP'$ (procalcitonin:4,lacticidven:4)  ) No results found for this or any previous visit (from the past 240 hour(s)).        Radiology Studies: No results found.      Scheduled Meds:  amLODipine  5 mg Per Tube Daily   arformoterol  15 mcg Nebulization BID   budesonide (PULMICORT) nebulizer solution  0.25 mg Nebulization BID   calcium carbonate (dosed in mg elemental calcium)  500 mg of elemental calcium Per Tube BID   chlordiazePOXIDE  10 mg Per Tube BID   Followed by   Derrill Memo ON 04/28/2022] chlordiazePOXIDE  5 mg Per Tube BID   Chlorhexidine Gluconate Cloth  6 each Topical Q0600   dexamethasone  6 mg Per Tube Daily   feeding supplement (PROSource TF20)  60 mL Per Tube Daily   folic acid  1 mg Intravenous Daily   heparin injection (subcutaneous)  5,000 Units Subcutaneous Q8H   insulin aspart  1-3 Units Subcutaneous Q4H   lactulose  10 g Per Tube QID   multivitamin  1 tablet Per Tube QHS   mouth rinse  15 mL Mouth Rinse 4 times per day   pantoprazole  40 mg Intravenous Q12H   revefenacin  175 mcg Nebulization Daily   Continuous Infusions:  anticoagulant sodium citrate     feeding supplement (VITAL 1.5 CAL) 65 mL/hr at 04/27/22 1037     LOS: 13 days    Time spent: 35 minutes.    Dana Allan, MD  [facility] Pager #: [phone] 7PM-7AM contact night coverage as above

## 2022-04-27 NOTE — Progress Notes (Signed)
[facility] Progress Note   Subjective:   Alert, answers yes and no questions. O2 sat 100% but still feels SOB. Respirations unlabored. Denies CP, dizziness, abdominal pain and nausea  Objective Vitals:   04/27/22 0815 04/27/22 0839 04/27/22 0841 04/27/22 0843  BP: (!) 144/85     Pulse: 77     Resp: 18     Temp: (!) 96.5 F (35.8 C)     TempSrc: Axillary     SpO2: 100% 99% 99% 99%  Weight:      Height:       Physical Exam General: Alert male in NAD Heart: RRR, no murmurs, rubs or gallops Lungs: On high flow [place], respirations unlabored, CTA anteriorly Abdomen: Soft, non-distended, +BS Extremities: No edema b/l lower extremities Dialysis Access: LUE AVF + bruit  Additional Objective Labs: Basic Metabolic Panel: Recent Labs  Lab 04/21/22 0658 04/22/22 0620 04/23/22 0639 04/24/22 0332 04/26/22 0113  NA 136   < > 133* 134* 136  K 4.1   < > 5.3* 4.3 4.2  CL 101   < > 99 95* 95*  CO2 23   < > '22 27 28  '$ GLUCOSE 123*   < > 161* 126* 128*  BUN 37*   < > 58* 35* 40*  CREATININE 6.53*   < > 8.69* 5.06* 4.36*  CALCIUM 8.6*   < > 9.0 8.7* 8.9  PHOS 3.5  --   --  2.7 3.0   < > = values in this interval not displayed.   Liver Function Tests: Recent Labs  Lab 04/22/22 0620 04/26/22 0113  AST 56*  --   ALT 16  --   ALKPHOS 66  --   BILITOT 0.8  --   PROT 7.3  --   ALBUMIN 1.7* 1.9*   No results for input(s): "LIPASE", "AMYLASE" in the last 168 hours. CBC: Recent Labs  Lab 04/22/22 0620 04/23/22 0520 04/24/22 0332 04/25/22 1455 04/26/22 0113  WBC 5.0 6.2 7.7 7.9 6.6  NEUTROABS 3.2  --   --  5.7 5.2  HGB 7.1* 6.7* 8.6* 9.5* 10.2*  HCT 21.2* 21.3* 25.2* 27.9* 30.0*  MCV 97.7 102.4* 95.1 96.2 96.2  PLT 73* 82* 91* 79* 99*   Blood Culture    Component Value Date/Time   SDES TRACHEAL ASPIRATE 04/15/2022 1622   SPECREQUEST NONE 04/15/2022 1622   CULT  04/15/2022 1622    RARE Normal respiratory flora-no Staph aureus or Pseudomonas seen Performed  at [facility] [address], [place],  [address]    REPTSTATUS 04/18/2022 FINAL 04/15/2022 1622    Cardiac Enzymes: No results for input(s): "CKTOTAL", "CKMB", "CKMBINDEX", "TROPONINI" in the last 168 hours. CBG: Recent Labs  Lab 04/26/22 1647 04/26/22 2041 04/26/22 2322 04/27/22 0358 04/27/22 0811  GLUCAP 137* 161* 138* 115* 104*   Iron Studies: No results for input(s): "IRON", "TIBC", "TRANSFERRIN", "FERRITIN" in the last 72 hours. '@lablastinr3'$ @ Studies/Results: No results found. Medications:  anticoagulant sodium citrate     feeding supplement (VITAL 1.5 CAL) 1,000 mL (04/27/22 0118)    amLODipine  5 mg Per Tube Daily   arformoterol  15 mcg Nebulization BID   budesonide (PULMICORT) nebulizer solution  0.25 mg Nebulization BID   calcium carbonate (dosed in mg elemental calcium)  500 mg of elemental calcium Per Tube BID   chlordiazePOXIDE  10 mg Per Tube BID   Followed by   Derrill Memo ON 04/28/2022] chlordiazePOXIDE  5 mg Per Tube BID  Chlorhexidine Gluconate Cloth  6 each Topical Q0600   dexamethasone  6 mg Per Tube Daily   feeding supplement (PROSource TF20)  60 mL Per Tube Daily   folic acid  1 mg Intravenous Daily   heparin injection (subcutaneous)  5,000 Units Subcutaneous Q8H   insulin aspart  1-3 Units Subcutaneous Q4H   lactulose  10 g Per Tube QID   multivitamin  1 tablet Per Tube QHS   mouth rinse  15 mL Mouth Rinse 4 times per day   pantoprazole  40 mg Intravenous Q12H   revefenacin  175 mcg Nebulization Daily    OP Dialysis Orders:  MWF [facility] 4h  400/A1.5    98.8kg   2K/2Ca bat   LUE AVF  Heparin none - Hectoral 67mg IV q HD - Appears to get IV iron with oncology, no ESA d/t cance  Assessment/Plan:  Acute hypoxic respiratory failure - following large volume emesis of blood and intubated in the ED.  Self-extubated 04/17/22, on HFNC but has been titrated down. plan per PCCM.  ABLA - acute on chronic with evidence of GI bleed.  Known AVM's.   Gi consulted. On octreotide and protonix.  Transfuse prn.  Covid-19 Positive -  Quarantined for 5 days per protocol.  per pmd  ESRD - Typically on  HD MWF. Had HD Friday with net UF 4L. Next HD Sunday (today) due to holiday, then resume MWF schedule.  Hypertension/volume  - last CXR 12/23 diffuse bilat infiltrates. Not sure vol vs aspiration pna vs other. No edema on exam but BP's have been up. Tolerated 4L UF with HD, will continue max UF goals as tolerated.   Anemia  - Multiple transfusions this admission, last Hgb 10.2. Not on ESA due to hepatic cancer.   Metabolic bone disease -  Corrected calcium 10.6. VDRA on hold. Phosphorus is at goal. Changing calcium carbonate from TID to BID today  Nutrition - has NG tube  Medical noncompliance - multiple admissions with ongoing issue  Hepatocellular carcinoma - s/p Y90 on 11/28/21.  SAnice Paganini PA-C 04/27/2022, 9:20 AM  CNewell RubbermaidPager: [phone]

## 2022-04-28 DIAGNOSIS — K922 Gastrointestinal hemorrhage, unspecified: Secondary | ICD-10-CM | POA: Diagnosis not present

## 2022-04-28 LAB — RENAL FUNCTION PANEL
Albumin: 1.9 g/dL — ABNORMAL LOW (ref 3.5–5.0)
Anion gap: 15 (ref 5–15)
BUN: 52 mg/dL — ABNORMAL HIGH (ref 8–23)
CO2: 25 mmol/L (ref 22–32)
Calcium: 8.4 mg/dL — ABNORMAL LOW (ref 8.9–10.3)
Chloride: 93 mmol/L — ABNORMAL LOW (ref 98–111)
Creatinine, Ser: 4.27 mg/dL — ABNORMAL HIGH (ref 0.61–1.24)
GFR, Estimated: 15 mL/min — ABNORMAL LOW (ref >60)
Glucose, Bld: 203 mg/dL — ABNORMAL HIGH (ref 70–99)
Phosphorus: 3.4 mg/dL (ref 2.5–4.6)
Potassium: 4.3 mmol/L (ref 3.5–5.1)
Sodium: 133 mmol/L — ABNORMAL LOW (ref 135–145)

## 2022-04-28 LAB — GLUCOSE, CAPILLARY
Glucose-Capillary: 126 mg/dL — ABNORMAL HIGH (ref 70–99)
Glucose-Capillary: 128 mg/dL — ABNORMAL HIGH (ref 70–99)
Glucose-Capillary: 129 mg/dL — ABNORMAL HIGH (ref 70–99)
Glucose-Capillary: 158 mg/dL — ABNORMAL HIGH (ref 70–99)
Glucose-Capillary: 92 mg/dL (ref 70–99)

## 2022-04-28 LAB — CBC
HCT: 30.7 % — ABNORMAL LOW (ref 39.0–52.0)
Hemoglobin: 10.8 g/dL — ABNORMAL LOW (ref 13.0–17.0)
MCH: 33.1 pg (ref 26.0–34.0)
MCHC: 35.2 g/dL (ref 30.0–36.0)
MCV: 94.2 fL (ref 80.0–100.0)
Platelets: 117 10*3/uL — ABNORMAL LOW (ref 150–400)
RBC: 3.26 MIL/uL — ABNORMAL LOW (ref 4.22–5.81)
RDW: 17.2 % — ABNORMAL HIGH (ref 11.5–15.5)
WBC: 6.6 10*3/uL (ref 4.0–10.5)
nRBC: 0 % (ref 0.0–0.2)

## 2022-04-28 MED ORDER — INSULIN ASPART 100 UNIT/ML IJ SOLN: 0.0000 [IU] | Freq: Three times a day (TID) | INTRAMUSCULAR | Status: DC

## 2022-04-28 MED ORDER — INSULIN ASPART 100 UNIT/ML IJ SOLN: 0.0000 [IU] | Freq: Every day | INTRAMUSCULAR | Status: DC

## 2022-04-28 NOTE — Progress Notes (Signed)
PROGRESS NOTE    JEVONTE CLANTON  GHW:[id number] DOB: [birth year] DOA: 04/14/2022 PCP: [name], NP  Outpatient Specialists:     Brief Narrative:  As per prior documentation: "this is a 62 year old gentleman, past medical history of peripheral arterial disease, dialysis patient Monday, end-stage renal disease, type 2 diabetes hepatocellular carcinoma status post radiation. seen in pulm clinic before for a lung nodule which on bronchoscopic eval / path was not c/w malignancy .  He has had GI bleeding in the past with 2 duodenal ulcers a AVM alcoholic cirrhosis with his known history hepatocellular carcinoma.  In the ER he was also found to be COVID-positive.   He ultimately had a large volume of bloody emesis in the emergency room he had respiratory failure and was intubated.  Pulmonary critical care was consulted for ICU was consulted for admission.  He had already been seen by hospitalist and admitted with GI consultation and IR but declined while he was in theEmergency department".   Significant Hospital Events:  12/17 covid + with generalized body sx, left ama 12/18 admitted with GIB. Intubated for hematemesis 12/19 starting on covid therapies  12/21 extubated 12/23 HD 12/24 IPAL discussion.  DNR/DNI 12/26 added decadron for increased WOB 12/28 got 2 units of blood on the 27thHemoglobin stable   04/28/2022: Patient pulled the core track tube.  Speech evaluated patient and patient is now able to feed orally.  Confusion is slowly improving.  Will consult PT OT.  Will also consult transition of care team.  Nephrology input is appreciated.  Hemodialysis is planned for today.  Assessment & Plan:   Principal Problem:   GIB (gastrointestinal bleeding) Active Problems:   Alcohol abuse   Thrombocytopenia (HCC)   Peptic ulcer disease   ESRD (end stage renal disease) (HCC)   Diabetes mellitus type 2, controlled, with complications ([place])   Physical deconditioning   Iron  deficiency anemia due to chronic blood loss   ABLA (acute blood loss anemia)   Acute on chronic anemia   Cirrhosis of liver with ascites (HCC)   Symptomatic anemia   Pneumonia due to COVID-19 virus   Acute on chronic respiratory failure (HCC)   Protein-calorie malnutrition, severe   Acute encephalopathy   Acute blood loss anemia due to upper GI bleed- Cirrhosis w/ HCC and 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, Grade 1 esophageal varices, cirrhosis -Managed with PPI twice daily. -Hemoglobin has remained stable. -No further bleeding   Acute respiratory failure with hypoxia Covid infection with pneumonia +pulmonary edema -extubated 04/17/2022. -Completed course of dexamethasone.  Started on 04/22/2022. -Continue supplemental oxygen. Continue nebulizer treatment.   Respiratory precautions x 14 days minimal, extended 21 if remains significantly hypoxic  DNR/DNI    ESRD on iHD, poorly compliant as an outpatient Fluid electrolyte imbalance: Hyponatremia, hyperkalemia, hyperphosphatemia -Nephrology is managing. -For hemodialysis today.   Thrombocytopenia-chronic due to cirrhosis: Likely acutely dropping 2/2 antibiotics or sepsis, this is improving Continue to monitor.  Last baseline was 106.   Hypertension Continuing amlodipine  As needed hydralazine for systolic blood pressure greater than 160    Severe protein calorie malnutrition Alcohol abuse: CIWA protocol utilized. Thiamine     Acute metabolic encephalopathy,  concern for ETOH withdrawal Continuing supportive care Improving.  DVT prophylaxis: Subcutaneous heparin Code Status: DO NOT RESUSCITATE Family Communication:  Disposition Plan: Patient remains inpatient.   Consultants:  Transfer from ICU team to [facility]    Procedures:  Intubation and extubation  Antimicrobials:  None  Subjective: -Patient continues to improve. -No shortness of breath or chest pain. -No fever.  Objective: Vitals:    04/28/22 1715 04/28/22 1730 04/28/22 1745 04/28/22 1800  BP: (!) 153/76 130/81 120/80 134/78  Pulse: 84 82 80 87  Resp: 16 17 (!) 21 (!) 23  Temp:      TempSrc:      SpO2: 95% 94% 95% 95%  Weight:      Height:        Intake/Output Summary (Last 24 hours) at 04/28/2022 1812 Last data filed at 04/28/2022 5003 Gross per 24 hour  Intake 985.83 ml  Output 1000 ml  Net -14.17 ml    Filed Weights   04/26/22 0500 04/27/22 0401 04/28/22 1600  Weight: 101.5 kg 101.6 kg 100.6 kg    Examination:  General exam: Awake.  More interactive.   Respiratory system: Decreased air entry. Cardiovascular system: S1 & S2 heard Gastrointestinal system: Abdomen is obese, soft and nontender.   Central nervous system:.  Awake.  More interactive.   Extremities: Status post left BKA.   Data Reviewed: I have personally reviewed following labs and imaging studies  CBC: Recent Labs  Lab 04/22/22 0620 04/23/22 0520 04/24/22 0332 04/25/22 1455 04/26/22 0113 04/27/22 1252  WBC 5.0 6.2 7.7 7.9 6.6 7.9  NEUTROABS 3.2  --   --  5.7 5.2 6.8  HGB 7.1* 6.7* 8.6* 9.5* 10.2* 9.9*  HCT 21.2* 21.3* 25.2* 27.9* 30.0* 28.7*  MCV 97.7 102.4* 95.1 96.2 96.2 96.3  PLT 73* 82* 91* 79* 99* 106*    Basic Metabolic Panel: Recent Labs  Lab 04/22/22 0620 04/23/22 0639 04/24/22 0332 04/26/22 0113 04/27/22 1252  NA 135 133* 134* 136 134*  K 4.5 5.3* 4.3 4.2 4.8  CL 100 99 95* 95* 95*  CO2 '24 22 27 28 25  '$ GLUCOSE 129* 161* 126* 128* 154*  BUN 46* 58* 35* 40* 77*  CREATININE 7.89* 8.69* 5.06* 4.36* 6.13*  CALCIUM 8.6* 9.0 8.7* 8.9 9.0  MG  --   --  2.0 1.9 2.2  PHOS  --   --  2.7 3.0 4.5    GFR: Estimated Creatinine Clearance: 15.1 mL/min (A) (by C-G formula based on SCr of 6.13 mg/dL (H)). Liver Function Tests: Recent Labs  Lab 04/22/22 0620 04/26/22 0113 04/27/22 1252  AST 56*  --   --   ALT 16  --   --   ALKPHOS 66  --   --   BILITOT 0.8  --   --   PROT 7.3  --   --   ALBUMIN 1.7* 1.9* 1.9*     No results for input(s): "LIPASE", "AMYLASE" in the last 168 hours. No results for input(s): "AMMONIA" in the last 168 hours.  Coagulation Profile: No results for input(s): "INR", "PROTIME" in the last 168 hours. Cardiac Enzymes: No results for input(s): "CKTOTAL", "CKMB", "CKMBINDEX", "TROPONINI" in the last 168 hours. BNP (last 3 results) No results for input(s): "PROBNP" in the last 8760 hours. HbA1C: No results for input(s): "HGBA1C" in the last 72 hours. CBG: Recent Labs  Lab 04/27/22 1830 04/27/22 2055 04/28/22 0018 04/28/22 0359 04/28/22 0947  GLUCAP 179* 140* 128* 129* 92    Lipid Profile: No results for input(s): "CHOL", "HDL", "LDLCALC", "TRIG", "CHOLHDL", "LDLDIRECT" in the last 72 hours. Thyroid Function Tests: No results for input(s): "TSH", "T4TOTAL", "FREET4", "T3FREE", "THYROIDAB" in the last 72 hours. Anemia Panel: No results for input(s): "VITAMINB12", "FOLATE", "FERRITIN", "TIBC", "IRON", "RETICCTPCT" in the last 72  hours. Urine analysis:    Component Value Date/Time   COLORURINE YELLOW 12/09/2021 0900   APPEARANCEUR CLEAR 12/09/2021 0900   LABSPEC 1.008 12/09/2021 0900   PHURINE 5.0 12/09/2021 0900   GLUCOSEU NEGATIVE 12/09/2021 0900   HGBUR MODERATE (A) 12/09/2021 0900   BILIRUBINUR NEGATIVE 12/09/2021 0900   KETONESUR NEGATIVE 12/09/2021 0900   PROTEINUR 100 (A) 12/09/2021 0900   UROBILINOGEN 0.2 12/31/2009 0746   NITRITE NEGATIVE 12/09/2021 0900   LEUKOCYTESUR NEGATIVE 12/09/2021 0900   Sepsis Labs: '@LABRCNTIP'$ (procalcitonin:4,lacticidven:4)  ) No results found for this or any previous visit (from the past 240 hour(s)).        Radiology Studies: No results found.      Scheduled Meds:  amLODipine  5 mg Per Tube Daily   arformoterol  15 mcg Nebulization BID   budesonide (PULMICORT) nebulizer solution  0.25 mg Nebulization BID   calcium carbonate (dosed in mg elemental calcium)  500 mg of elemental calcium Per Tube BID    chlordiazePOXIDE  5 mg Per Tube BID   Chlorhexidine Gluconate Cloth  6 each Topical Q0600   dexamethasone  6 mg Per Tube Daily   folic acid  1 mg Intravenous Daily   heparin injection (subcutaneous)  5,000 Units Subcutaneous Q8H   insulin aspart  0-5 Units Subcutaneous QHS   insulin aspart  0-6 Units Subcutaneous TID WC   lactulose  10 g Per Tube QID   multivitamin  1 tablet Per Tube QHS   mouth rinse  15 mL Mouth Rinse 4 times per day   pantoprazole  40 mg Intravenous Q12H   revefenacin  175 mcg Nebulization Daily   Continuous Infusions:  anticoagulant sodium citrate       LOS: 14 days    Time spent: 35 minutes.    Dana Allan, MD  [facility] Pager #: [phone] 7PM-7AM contact night coverage as above

## 2022-04-28 NOTE — Procedures (Signed)
HD Note:  Some information was entered later than the data was gathered due to patient care needs. The stated time with the data is accurate.  Patient was treated in [place] because of his COVID diagnosis and continued Air/con precautions.  He was responsive to voice, but not interactive. He presents with an audible wheeze at the beginning of the treatment.  UF goal lowered to 3500 ml related to BP.  BFR rate lowered to 350 related to venous pressures.  Transported back to the room  Alert, without acute distress.  Hand-off given to patient's nurse.   Access used: Left upper arm AVF Access issues: None  Total UF removed: 3500 ml   [facility]

## 2022-04-28 NOTE — Progress Notes (Signed)
[facility] Progress Note   Subjective:   due for HD today -  had to be bumped from yesterday.  He is alert but does not recognize me.  Telling me he needs to go the drug store to get his prescriptions  Objective Vitals:   04/28/22 0022 04/28/22 0055 04/28/22 0402 04/28/22 0752  BP: (!) 174/96 (!) 154/91 (!) 178/97   Pulse: 74 75 77 79  Resp: '16 15 16 16  '$ Temp: 97.7 F (36.5 C)  97.6 F (36.4 C)   TempSrc: Oral  Oral   SpO2: 100% 100% 98% 98%  Weight:      Height:       Physical Exam General: Alert male in NAD-  confused  Heart: RRR, no murmurs, rubs or gallops Lungs: On high flow [place], respirations unlabored, CTA anteriorly Abdomen: Soft, non-distended, +BS Extremities: No edema b/l lower extremities Dialysis Access: LUE AVF + bruit  Additional Objective Labs: Basic Metabolic Panel: Recent Labs  Lab 04/24/22 0332 04/26/22 0113 04/27/22 1252  NA 134* 136 134*  K 4.3 4.2 4.8  CL 95* 95* 95*  CO2 '27 28 25  '$ GLUCOSE 126* 128* 154*  BUN 35* 40* 77*  CREATININE 5.06* 4.36* 6.13*  CALCIUM 8.7* 8.9 9.0  PHOS 2.7 3.0 4.5   Liver Function Tests: Recent Labs  Lab 04/22/22 0620 04/26/22 0113 04/27/22 1252  AST 56*  --   --   ALT 16  --   --   ALKPHOS 66  --   --   BILITOT 0.8  --   --   PROT 7.3  --   --   ALBUMIN 1.7* 1.9* 1.9*   No results for input(s): "LIPASE", "AMYLASE" in the last 168 hours. CBC: Recent Labs  Lab 04/23/22 0520 04/24/22 0332 04/25/22 1455 04/26/22 0113 04/27/22 1252  WBC 6.2 7.7 7.9 6.6 7.9  NEUTROABS  --   --  5.7 5.2 6.8  HGB 6.7* 8.6* 9.5* 10.2* 9.9*  HCT 21.3* 25.2* 27.9* 30.0* 28.7*  MCV 102.4* 95.1 96.2 96.2 96.3  PLT 82* 91* 79* 99* 106*   Blood Culture    Component Value Date/Time   SDES TRACHEAL ASPIRATE 04/15/2022 1622   SPECREQUEST NONE 04/15/2022 1622   CULT  04/15/2022 1622    RARE Normal respiratory flora-no Staph aureus or Pseudomonas seen Performed at [facility] [address],  [place], [place] [address]    REPTSTATUS 04/18/2022 FINAL 04/15/2022 1622    Cardiac Enzymes: No results for input(s): "CKTOTAL", "CKMB", "CKMBINDEX", "TROPONINI" in the last 168 hours. CBG: Recent Labs  Lab 04/27/22 1830 04/27/22 2055 04/28/22 0018 04/28/22 0359 04/28/22 0947  GLUCAP 179* 140* 128* 129* 92   Iron Studies: No results for input(s): "IRON", "TIBC", "TRANSFERRIN", "FERRITIN" in the last 72 hours. '@lablastinr3'$ @ Studies/Results: No results found. Medications:  anticoagulant sodium citrate     feeding supplement (VITAL 1.5 CAL) Stopped (04/28/22 0952)    amLODipine  5 mg Per Tube Daily   arformoterol  15 mcg Nebulization BID   budesonide (PULMICORT) nebulizer solution  0.25 mg Nebulization BID   calcium carbonate (dosed in mg elemental calcium)  500 mg of elemental calcium Per Tube BID   chlordiazePOXIDE  10 mg Per Tube BID   Followed by   chlordiazePOXIDE  5 mg Per Tube BID   Chlorhexidine Gluconate Cloth  6 each Topical Q0600   dexamethasone  6 mg Per Tube Daily   feeding supplement (PROSource TF20)  60 mL Per  Tube Daily   folic acid  1 mg Intravenous Daily   heparin injection (subcutaneous)  5,000 Units Subcutaneous Q8H   insulin aspart  1-3 Units Subcutaneous Q4H   lactulose  10 g Per Tube QID   multivitamin  1 tablet Per Tube QHS   mouth rinse  15 mL Mouth Rinse 4 times per day   pantoprazole  40 mg Intravenous Q12H   revefenacin  175 mcg Nebulization Daily    OP Dialysis Orders:  MWF [place] 4h  400/A1.5    98.8kg   2K/2Ca bat   LUE AVF  Heparin none - Hectoral 77mg IV q HD - Appears to get IV iron with oncology, no ESA d/t cance  Assessment/Plan:  Acute hypoxic respiratory failure - following large volume emesis of blood and intubated in the ED.  Self-extubated 04/17/22, on HFNC but has been titrated down. plan per PCCM.  ABLA - acute on chronic with evidence of GI bleed.  Known AVM's.  Gi consulted. On octreotide and protonix.  Transfuse prn.  Covid-19  Positive -  Quarantined for 5 days per protocol.  per pmd  ESRD - Typically on  HD MWF. Had HD Friday with net UF 4L. Next HD today, via AVF resuming MWF schedule.  Hypertension/volume  - last CXR 12/23 diffuse bilat infiltrates. Not sure vol vs aspiration pna vs other. No edema on exam but BP's have been up. Tolerated 4L UF with HD, will continue max UF goals as tolerated.   Anemia  - Multiple transfusions this admission, last Hgb 10.2. Not on ESA due to hepatic cancer.   Metabolic bone disease -  Corrected calcium 10.6. VDRA on hold. Phosphorus is at goal. Changed calcium carbonate from TID to BID today  Nutrition - has NG tube  Medical noncompliance - multiple admissions with ongoing issue  Hepatocellular carcinoma - s/p Y90 on 11/28/21.  Daniel [name] 04/28/2022, 10:33 AM  [employer]

## 2022-04-28 NOTE — Evaluation (Signed)
Clinical/Bedside Swallow Evaluation Patient Details  Name: Daniel [name] MRN: [id number] Date of Birth: [birth year]  Today's Date: 04/28/2022 Time: SLP Start Time (ACUTE ONLY): 1101 SLP Stop Time (ACUTE ONLY): 1113 SLP Time Calculation (min) (ACUTE ONLY): 12 min  Past Medical History:  Past Medical History:  Diagnosis Date   Anemia    Diabetes mellitus without complication ([place])    patient denies   Dialysis patient [facility])    End stage chronic kidney disease ([place])    Hypertension    ICH (intracerebral hemorrhage) ([place]) 05/20/2017   PAD (peripheral artery disease) ([facility])    Shoulder pain, left 06/28/2013   Past Surgical History:  Past Surgical History:  Procedure Laterality Date   A/V FISTULAGRAM N/A 08/15/2020   Procedure: A/V FISTULAGRAM - Left Upper;  Surgeon: Cherre Robins, MD;  Location: [place] [facility];  Service: Cardiovascular;  Laterality: N/A;   ABDOMINAL AORTOGRAM W/LOWER EXTREMITY N/A 04/08/2021   Procedure: ABDOMINAL AORTOGRAM W/LOWER EXTREMITY;  Surgeon: Waynetta Sandy, MD;  Location: [place] [facility];  Service: Cardiovascular;  Laterality: N/A;   AMPUTATION Left 04/16/2021   Procedure: LEFT BELOW KNEE AMPUTATION;  Surgeon: Angelia Mould, MD;  Location: [place];  Service: Vascular;  Laterality: Left;   AMPUTATION Left 06/17/2021   Procedure: REVISION AMPUTATION BELOW KNEE;  Surgeon: Broadus John, MD;  Location: [place];  Service: Vascular;  Laterality: Left;   APPENDECTOMY     APPLICATION OF WOUND VAC Left 06/17/2021   Procedure: APPLICATION OF WOUND VAC;  Surgeon: Broadus John, MD;  Location: [place];  Service: Vascular;  Laterality: Left;   AV FISTULA PLACEMENT Left 08/03/2019   Procedure: LEFT ARM ARTERIOVENOUS (AV) CEPHALIC  FISTULA CREATION;  Surgeon: Waynetta Sandy, MD;  Location: [place];  Service: Vascular;  Laterality: Left;   BIOPSY  06/30/2019   Procedure: BIOPSY;  Surgeon: Ronald Lobo, MD;  Location: [facility] ENDOSCOPY;   Service: Endoscopy;;   BIOPSY  08/02/2019   Procedure: BIOPSY;  Surgeon: Yetta Flock, MD;  Location: [facility] ENDOSCOPY;  Service: Gastroenterology;;   BIOPSY  02/08/2021   Procedure: BIOPSY;  Surgeon: Sharyn Creamer, MD;  Location: [facility] ENDOSCOPY;  Service: Gastroenterology;;   BIOPSY  02/24/2021   Procedure: BIOPSY;  Surgeon: [name], MD;  Location: [facility] ENDOSCOPY;  Service: Gastroenterology;;   BIOPSY  03/26/2021   Procedure: BIOPSY;  Surgeon: Lavena Bullion, DO;  Location: [place];  Service: Gastroenterology;;   BIOPSY  08/06/2021   Procedure: BIOPSY;  Surgeon: [name], MD;  Location: [place];  Service: Gastroenterology;;   BIOPSY  09/15/2021   Procedure: BIOPSY;  Surgeon: Sharyn Creamer, MD;  Location: [place];  Service: Gastroenterology;;   BRONCHIAL BIOPSY  12/26/2021   Procedure: BRONCHIAL BIOPSIES;  Surgeon: Juanito Doom, MD;  Location: [place];  Service: Cardiopulmonary;;   BRONCHIAL WASHINGS  12/26/2021   Procedure: BRONCHIAL WASHINGS;  Surgeon: Juanito Doom, MD;  Location: [place];  Service: Cardiopulmonary;;   COLONOSCOPY  01/23/2012   Procedure: COLONOSCOPY;  Surgeon: Danie Binder, MD;  [facility];  Service: Endoscopy;  Laterality: N/A;  11:10 AM   COLONOSCOPY WITH PROPOFOL N/A 06/30/2019   Procedure: COLONOSCOPY WITH PROPOFOL;  Surgeon: Ronald Lobo, MD;  Location: [place];  Service: Endoscopy;  Laterality: N/A;   ENTEROSCOPY N/A 08/02/2019   Procedure: ENTEROSCOPY;  Surgeon: Yetta Flock, MD;  Location: [facility] ENDOSCOPY;  Service: Gastroenterology;  Laterality: N/A;   ENTEROSCOPY N/A  02/24/2021   Procedure: ENTEROSCOPY;  Surgeon: [name], MD;  Location: [place];  Service: Gastroenterology;  Laterality: N/A;   ENTEROSCOPY N/A 03/26/2021   Procedure: ENTEROSCOPY;  Surgeon: Lavena Bullion, DO;  Location: [place];  Service: Gastroenterology;  Laterality: N/A;   ENTEROSCOPY  N/A 07/05/2021   Procedure: ENTEROSCOPY;  Surgeon: Carol Ada, MD;  Location: [place];  Service: Gastroenterology;  Laterality: N/A;   ENTEROSCOPY N/A 08/06/2021   Procedure: ENTEROSCOPY;  Surgeon: [name], MD;  Location: [facility] ENDOSCOPY;  Service: Gastroenterology;  Laterality: N/A;   ENTEROSCOPY N/A 08/24/2021   Procedure: ENTEROSCOPY;  Surgeon: Ladene Artist, MD;  Location: [facility] ENDOSCOPY;  Service: Gastroenterology;  Laterality: N/A;   ENTEROSCOPY N/A 09/15/2021   Procedure: ENTEROSCOPY;  Surgeon: Sharyn Creamer, MD;  Location: [facility] ENDOSCOPY;  Service: Gastroenterology;  Laterality: N/A;   ENTEROSCOPY N/A 10/06/2021   Procedure: ENTEROSCOPY;  Surgeon: [name], MD;  Location: [facility] ENDOSCOPY;  Service: Gastroenterology;  Laterality: N/A;   ENTEROSCOPY N/A 11/14/2021   Procedure: ENTEROSCOPY;  Surgeon: Doran Stabler, MD;  Location: [facility] ENDOSCOPY;  Service: Gastroenterology;  Laterality: N/A;   ESOPHAGOGASTRODUODENOSCOPY N/A 08/10/2020   Procedure: ESOPHAGOGASTRODUODENOSCOPY (EGD);  Surgeon: Jerene Bears, MD;  Location: [facility] ENDOSCOPY;  Service: Gastroenterology;  Laterality: N/A;   ESOPHAGOGASTRODUODENOSCOPY (EGD) WITH PROPOFOL N/A 06/30/2019   Procedure: ESOPHAGOGASTRODUODENOSCOPY (EGD) WITH PROPOFOL;  Surgeon: Ronald Lobo, MD;  Location: [place];  Service: Endoscopy;  Laterality: N/A;   ESOPHAGOGASTRODUODENOSCOPY (EGD) WITH PROPOFOL N/A 01/12/2021   Procedure: ESOPHAGOGASTRODUODENOSCOPY (EGD) WITH PROPOFOL;  Surgeon: Sharyn Creamer, MD;  Location: [place];  Service: Gastroenterology;  Laterality: N/A;   ESOPHAGOGASTRODUODENOSCOPY (EGD) WITH PROPOFOL N/A 02/08/2021   Procedure: ESOPHAGOGASTRODUODENOSCOPY (EGD) WITH PROPOFOL;  Surgeon: Sharyn Creamer, MD;  Location: [place];  Service: Gastroenterology;  Laterality: N/A;   FISTULA SUPERFICIALIZATION Left 10/17/2019   Procedure: LEFT UPPER EXTREMITY FISTULA REVISION, SIDE BRANCH LIGATION,  AND  SUPERFICIALIZATION;  Surgeon: Marty Heck, MD;  Location: [place];  Service: Vascular;  Laterality: Left;   FISTULA SUPERFICIALIZATION Left 50/12/3816   Procedure: PLICATION OF ANEURYSM LEFT ARTERIOVENOUS FISTULA;  Surgeon: Angelia Mould, MD;  Location: [place];  Service: Vascular;  Laterality: Left;   FLEXIBLE SIGMOIDOSCOPY N/A 07/05/2021   Procedure: FLEXIBLE SIGMOIDOSCOPY;  Surgeon: Carol Ada, MD;  Location: [place];  Service: Gastroenterology;  Laterality: N/A;   GIVENS CAPSULE STUDY N/A 06/30/2019   Procedure: GIVENS CAPSULE STUDY;  Surgeon: Ronald Lobo, MD;  Location: [place];  Service: Endoscopy;  Laterality: N/A;   GIVENS CAPSULE STUDY N/A 08/25/2021   Procedure: GIVENS CAPSULE STUDY;  Surgeon: Ladene Artist, MD;  Location: [place];  Service: Gastroenterology;  Laterality: N/A;   GIVENS CAPSULE STUDY N/A 10/06/2021   Procedure: GIVENS CAPSULE STUDY;  Surgeon: [name], MD;  Location: [place];  Service: Gastroenterology;  Laterality: N/A;   HEMOSTASIS CLIP PLACEMENT  08/10/2020   Procedure: HEMOSTASIS CLIP PLACEMENT;  Surgeon: Jerene Bears, MD;  Location: [place];  Service: Gastroenterology;;   HEMOSTASIS CLIP PLACEMENT  01/12/2021   Procedure: HEMOSTASIS CLIP PLACEMENT;  Surgeon: Sharyn Creamer, MD;  Location: [place];  Service: Gastroenterology;;   HEMOSTASIS CONTROL  08/02/2019   Procedure: HEMOSTASIS CONTROL;  Surgeon: Yetta Flock, MD;  Location: [place];  Service: Gastroenterology;;   HOT HEMOSTASIS  02/24/2021   Procedure: HOT HEMOSTASIS (ARGON PLASMA COAGULATION/BICAP);  Surgeon: [name], MD;  Location: [facility] ENDOSCOPY;  Service: Gastroenterology;;  HOT HEMOSTASIS N/A 03/26/2021   Procedure: HOT HEMOSTASIS (ARGON PLASMA COAGULATION/BICAP);  Surgeon: Lavena Bullion, DO;  Location: [facility] ENDOSCOPY;  Service: Gastroenterology;  Laterality: N/A;   HOT HEMOSTASIS N/A 07/05/2021   Procedure: HOT HEMOSTASIS  (ARGON PLASMA COAGULATION/BICAP);  Surgeon: Carol Ada, MD;  Location: [place];  Service: Gastroenterology;  Laterality: N/A;   HOT HEMOSTASIS N/A 09/15/2021   Procedure: HOT HEMOSTASIS (ARGON PLASMA COAGULATION/BICAP);  Surgeon: Sharyn Creamer, MD;  Location: [place];  Service: Gastroenterology;  Laterality: N/A;   HOT HEMOSTASIS N/A 10/06/2021   Procedure: HOT HEMOSTASIS (ARGON PLASMA COAGULATION/BICAP);  Surgeon: [name], MD;  Location: [place];  Service: Gastroenterology;  Laterality: N/A;   INCISION AND DRAINAGE ABSCESS N/A 06/29/2016   Procedure: INCISION AND DRAINAGE ABDOMINAL WALL ABSCESS;  Surgeon: Alphonsa Overall, MD;  Location: [facility];  Service: General;  Laterality: N/A;   INSERTION OF DIALYSIS CATHETER Right 08/03/2019   Procedure: INSERTION OF DIALYSIS CATHETER;  Surgeon: Waynetta Sandy, MD;  Location: [place];  Service: Vascular;  Laterality: Right;   INSERTION OF DIALYSIS CATHETER Right 10/22/2019   Procedure: INSERTION OF 23CM TUNNELED DIALYSIS CATHETER RIGHT INTERNAL JUGULAR;  Surgeon: Angelia Mould, MD;  Location: [place];  Service: Vascular;  Laterality: Right;   INSERTION OF DIALYSIS CATHETER Right 08/12/2020   Procedure: INSERTION OF Right internal Jugular TUNNELED  DIALYSIS CATHETER.;  Surgeon: Waynetta Sandy, MD;  Location: [place];  Service: Vascular;  Laterality: Right;   INSERTION OF DIALYSIS CATHETER N/A 04/19/2021   Procedure: INSERTION OF TUNNELED DIALYSIS CATHETER;  Surgeon: Angelia Mould, MD;  Location: [place];  Service: Vascular;  Laterality: N/A;   IR 3D INDEPENDENT WKST  11/07/2021   IR ANGIOGRAM SELECTIVE EACH ADDITIONAL VESSEL  11/07/2021   IR ANGIOGRAM SELECTIVE EACH ADDITIONAL VESSEL  11/07/2021   IR ANGIOGRAM SELECTIVE EACH ADDITIONAL VESSEL  11/07/2021   IR ANGIOGRAM SELECTIVE EACH ADDITIONAL VESSEL  11/28/2021   IR ANGIOGRAM VISCERAL SELECTIVE  11/07/2021   IR ANGIOGRAM VISCERAL SELECTIVE  11/28/2021   IR EMBO  ARTERIAL NOT HEMORR HEMANG INC GUIDE ROADMAPPING  11/07/2021   IR EMBO TUMOR ORGAN ISCHEMIA INFARCT INC GUIDE ROADMAPPING  11/28/2021   IR PARACENTESIS  02/20/2022   IR RADIOLOGIST EVAL & MGMT  09/12/2021   IR US GUIDE VASC ACCESS LEFT  11/07/2021   IR US GUIDE VASC ACCESS LEFT  11/28/2021   Left heel surgery     LOWER EXTREMITY ANGIOGRAPHY N/A 07/08/2021   Procedure: Lower Extremity Angiography;  Surgeon: Cherre Robins, MD;  Location: [place] [facility];  Service: Cardiovascular;  Laterality: N/A;   PENILE BIOPSY N/A 03/26/2020   Procedure: PENILE ULCER DEBRIDEMENT;  Surgeon: Remi Haggard, MD;  [facility];  Service: Urology;  Laterality: N/A;  30 MINS   PERIPHERAL VASCULAR INTERVENTION Right 07/08/2021   Procedure: PERIPHERAL VASCULAR INTERVENTION;  Surgeon: Cherre Robins, MD;  Location: [place] [facility];  Service: Cardiovascular;  Laterality: Right;   SCLEROTHERAPY  01/12/2021   Procedure: SCLEROTHERAPY;  Surgeon: Sharyn Creamer, MD;  Location: [facility] ENDOSCOPY;  Service: Gastroenterology;;   Clide Deutscher  02/24/2021   Procedure: Clide Deutscher;  Surgeon: [name], MD;  Location: [place];  Service: Gastroenterology;;   SUBMUCOSAL TATTOO INJECTION  08/24/2021   Procedure: SUBMUCOSAL TATTOO INJECTION;  Surgeon: Ladene Artist, MD;  Location: [place];  Service: Gastroenterology;;   SUBMUCOSAL TATTOO INJECTION  09/15/2021   Procedure: SUBMUCOSAL TATTOO INJECTION;  Surgeon: Sharyn Creamer, MD;  Location: [facility];  Service: Gastroenterology;;   VIDEO BRONCHOSCOPY N/A 12/26/2021   Procedure: VIDEO BRONCHOSCOPY WITH FLUORO;  Surgeon: Juanito Doom, MD;  Location: [place];  Service: Cardiopulmonary;  Laterality: N/A;   HPI:  62 year old gentleman, past medical history of peripheral arterial disease, end-stage renal disease with HD, type 2 diabetes hepatocellular carcinoma status post radiation, lung nodule which on bronchoscopic eval / path was not c/w  malignancy. GI bleed,  2 duodenal ulcers a AVM alcoholic cirrhosis with his known history hepatocellular carcinoma.  In the ER he was also found to be COVID-positive, large volume of bloody emesis in the emergency room, respiratory failure and was intubated 12/18-12/21. Found to have GI bleed- now resolved. Cortrak out.    Assessment / Plan / Recommendation  Clinical Impression  Pt is pleasant and interactive during swallow assessment. His dentition and oromotor function is intact with indications of lingual candidia. Respiratory and swallow coordination is fluid and without indications of aspiration. He consumed thin with straw, applesauce and trials of graham cracker with clear vocal quality. Recommend initiation of regular, thin and pills with thin liquids, upright posture. No further ST needed. SLP Visit Diagnosis: Dysphagia, unspecified (R13.10)    Aspiration Risk  No limitations    Diet Recommendation Regular;Thin liquid   Liquid Administration via: Straw;Cup Medication Administration: Whole meds with liquid Supervision: Staff to assist with self feeding Compensations: Slow rate;Small sips/bites;Minimize environmental distractions Postural Changes: Seated upright at 90 degrees    Other  Recommendations Oral Care Recommendations: Oral care BID    Recommendations for follow up therapy are one component of a multi-disciplinary discharge planning process, led by the attending physician.  Recommendations may be updated based on patient status, additional functional criteria and insurance authorization.  Follow up Recommendations No SLP follow up      Assistance Recommended at Discharge    Functional Status Assessment Patient has had a recent decline in their functional status and demonstrates the ability to make significant improvements in function in a reasonable and predictable amount of time.  Frequency and Duration            Prognosis        Swallow Study   General Date of  Onset: 04/14/22 HPI: 62 year old gentleman, past medical history of peripheral arterial disease, end-stage renal disease with HD, type 2 diabetes hepatocellular carcinoma status post radiation, lung nodule which on bronchoscopic eval / path was not c/w malignancy. GI bleed,  2 duodenal ulcers a AVM alcoholic cirrhosis with his known history hepatocellular carcinoma.  In the ER he was also found to be COVID-positive, large volume of bloody emesis in the emergency room, respiratory failure and was intubated 12/18-12/21. Found to have GI bleed- now resolved. Cortrak out. Type of Study: Bedside Swallow Evaluation Previous Swallow Assessment:  (no) Diet Prior to this Study: NPO Temperature Spikes Noted: No Respiratory Status: Room air History of Recent Intubation: Yes Length of Intubations (days): 4 days Date extubated: 04/17/22 Behavior/Cognition: Alert;Cooperative;Pleasant mood Oral Cavity Assessment: Other (comment) (lingual candidias) Oral Care Completed by SLP: No Oral Cavity - Dentition: Adequate natural dentition Vision: Functional for self-feeding Self-Feeding Abilities: Able to feed self Patient Positioning: Upright in bed Baseline Vocal Quality: Normal    Oral/Motor/Sensory Function Overall Oral Motor/Sensory Function: Within functional limits   Ice Chips Ice chips: Not tested   Thin Liquid Thin Liquid: Within functional limits Presentation: Cup;Straw    Nectar Thick Nectar Thick Liquid: Not tested   Honey Thick Honey Thick Liquid:  Not tested   Puree Puree: Within functional limits   Solid     Solid: Within functional limits      Houston Siren 04/28/2022,11:26 AM

## 2022-04-29 DIAGNOSIS — J9621 Acute and chronic respiratory failure with hypoxia: Secondary | ICD-10-CM

## 2022-04-29 DIAGNOSIS — K920 Hematemesis: Secondary | ICD-10-CM

## 2022-04-29 DIAGNOSIS — D62 Acute posthemorrhagic anemia: Secondary | ICD-10-CM | POA: Diagnosis not present

## 2022-04-29 DIAGNOSIS — E118 Type 2 diabetes mellitus with unspecified complications: Secondary | ICD-10-CM | POA: Diagnosis not present

## 2022-04-29 DIAGNOSIS — U071 COVID-19: Secondary | ICD-10-CM | POA: Diagnosis not present

## 2022-04-29 DIAGNOSIS — G9340 Encephalopathy, unspecified: Secondary | ICD-10-CM

## 2022-04-29 DIAGNOSIS — K7031 Alcoholic cirrhosis of liver with ascites: Secondary | ICD-10-CM | POA: Diagnosis not present

## 2022-04-29 DIAGNOSIS — E43 Unspecified severe protein-calorie malnutrition: Secondary | ICD-10-CM

## 2022-04-29 DIAGNOSIS — N186 End stage renal disease: Secondary | ICD-10-CM | POA: Diagnosis not present

## 2022-04-29 LAB — GLUCOSE, CAPILLARY
Glucose-Capillary: 100 mg/dL — ABNORMAL HIGH (ref 70–99)
Glucose-Capillary: 109 mg/dL — ABNORMAL HIGH (ref 70–99)
Glucose-Capillary: 174 mg/dL — ABNORMAL HIGH (ref 70–99)

## 2022-04-29 MED ORDER — AMLODIPINE BESYLATE 5 MG PO TABS
5.0000 mg | ORAL_TABLET | Freq: Every day | ORAL | Status: AC
  Administered 2022-04-29: 5 mg via ORAL
  Filled 2022-04-29: qty 1

## 2022-04-29 MED ORDER — AMLODIPINE BESYLATE 10 MG PO TABS
10.0000 mg | ORAL_TABLET | Freq: Every day | ORAL | Status: DC
  Administered 2022-05-01 – 2022-05-05 (×4): 10 mg via ORAL
  Filled 2022-04-29 (×4): qty 1

## 2022-04-29 MED ORDER — HALOPERIDOL LACTATE 5 MG/ML IJ SOLN
2.0000 mg | Freq: Four times a day (QID) | INTRAMUSCULAR | Status: DC | PRN
  Administered 2022-04-29 – 2022-05-04 (×8): 2 mg via INTRAVENOUS
  Filled 2022-04-29 (×8): qty 1

## 2022-04-29 MED ORDER — HYDRALAZINE HCL 25 MG PO TABS: 25.0000 mg | ORAL_TABLET | Freq: Four times a day (QID) | ORAL | Status: DC | PRN

## 2022-04-29 MED ORDER — CHLORHEXIDINE GLUCONATE CLOTH 2 % EX PADS
6.0000 | MEDICATED_PAD | Freq: Every day | CUTANEOUS | Status: DC
  Administered 2022-04-30 – 2022-05-03 (×4): 6 via TOPICAL

## 2022-04-29 NOTE — Progress Notes (Signed)
PROGRESS NOTE  Daniel [name] VOZ:[id number] DOB: [birth year]   PCP: [name], NP  Patient is from: Home.  DOA: 04/14/2022 LOS: 28  Chief complaints Chief Complaint  Patient presents with   Generalized Body Aches   Cough     Brief Narrative / Interim history: 62 year old M with PMH of PAD s/p left BKA, ESRD on HD MWF, DM-2, EtOH cirrhosis, HCC, GI bleed, duodenal ulcer and AVM returning with hematemesis on 12/18 after he left AMA on 12/17.  Tested positive for COVID on 12/17 after he presented with generalized body symptoms and left AMA.  He returned on 12/18 with hematemesis and intubated.  He was extubated on 12/21.  CODE STATUS changed to DNR/DNI on 12/24.  He was transfused 2 units of blood on 04/24/2022.  Eventually, he was stabilized and transferred to hospitalist service.   Hospital course complicated by delirium.  Therapy recommended SNF.  Subjective: Seen and examined earlier this morning.  Patient was confused and trying to get out of the bed.  He tells me he wants to go to [employer] and get tennis shoes.  He is oriented to self and "hospital".  Follows commands.  Denies chest pain, shortness of breath or GI symptoms.  Objective: Vitals:   04/29/22 0700 04/29/22 0750 04/29/22 0945 04/29/22 1137  BP:  (!) 142/84  (!) 164/68  Pulse:    75  Resp:  14  12  Temp:  97.7 F (36.5 C)  97.6 F (36.4 C)  TempSrc:  Oral  Oral  SpO2: 91%  96% (!) 82%  Weight:      Height:        Examination:  GENERAL: No apparent distress.  Nontoxic. HEENT: MMM.  Vision and hearing grossly intact.  NECK: Supple.  No apparent JVD.  RESP:  No IWOB.  Fair aeration bilaterally. CVS:  RRR. Heart sounds normal.  ABD/GI/GU: BS+. Abd soft, NTND.  MSK/EXT:  Moves extremities.  Left BKA. SKIN: no apparent skin lesion or wound NEURO: Awake.  Oriented to self and "hospital".  Follows some commands.  No apparent focal neuro deficit. PSYCH: Calm.  No distress or  agitation.  Procedures:  ETT from 12/18-12/21  Microbiology summarized: 12/19-MRSA PCR screen and respiratory cultures negative.  Assessment and plan: Principal Problem:   GIB (gastrointestinal bleeding) Active Problems:   ABLA (acute blood loss anemia)   Alcohol abuse   ESRD (end stage renal disease) (HCC)   Acute encephalopathy   Thrombocytopenia (HCC)   Peptic ulcer disease   Diabetes mellitus type 2, controlled, with complications ([place])   Physical deconditioning   Iron deficiency anemia due to chronic blood loss   Acute on chronic anemia   Cirrhosis of liver with ascites (HCC)   Symptomatic anemia   Pneumonia due to COVID-19 virus   Acute on chronic respiratory failure (HCC)   Protein-calorie malnutrition, severe   Acute blood loss anemia due to upper GI bleed/hematemesis: Has EtOH cirrhosis, duodenal ulcer, AVM, portal gastropathy and G1 EV-H&H stable since she received 2 units on 12/28. Recent Labs    04/19/22 0640 04/20/22 0632 04/21/22 0658 04/22/22 0620 04/23/22 0520 04/24/22 0332 04/25/22 1455 04/26/22 0113 04/27/22 1252 04/28/22 2210  HGB 7.7* 7.4* 7.3* 7.1* 6.7* 8.6* 9.5* 10.2* 9.9* 10.8*  -Continue PPI -Encouraged alcohol cessation.  Acute respiratory failure with hypoxia in the setting of COVID-19 infection, pneumonia and pulmonary edema: Intubated and mechanical ventilation from 12/28-12/21.  Currently on room air, DNR and DNI. -Discontinue Decadron -Completed 7  days of ceftriaxone.  Briefly received vancomycin as well. -Discontinue isolation precautions. -Continue breathing treatments   ESRD on iHD MWF, poorly compliant as an outpatient Hyponatremia, hyperkalemia, hyperphosphatemia -Nephrology is managing.   Thrombocytopenia-chronic due to cirrhosis: Improving. Recent Labs  Lab 04/23/22 0520 04/24/22 0332 04/25/22 1455 04/26/22 0113 04/27/22 1252 04/28/22 2210  PLT 82* 91* 79* 99* 106* 117*  -Monitor intermittently  History of EtOH  cirrhosis/HCC -Outpatient follow-up.   Uncontrolled hypertension: BP elevated. -Increase amlodipine to 10 mg daily. -Change as needed hydralazine to p.o.   Acute metabolic encephalopathy/delirium: Should be outside withdrawal window for alcohol by now. -Reorientation and delirium precaution. -IV Haldol 2 mg every 6 hours as needed.  No prolonged QT on EKG. -Continue weaning of Librium taper -Optimize electrolytes.  Controlled NIDDM-2: A1c 5.1%.  CBG fairly controlled. -Discontinue CBG monitoring and SSI insulin  Physical deconditioning/left BKA -Therapy recommended SNF  Goal of care: DNR/DNI. -Palliative following.  Severe protein calorie malnutrition in the setting of alcohol abuse and chronic disease Body mass index is 30.93 kg/m. Nutrition Problem: Severe Malnutrition Etiology: chronic illness (alcoholic cirrhosis, hepatocellular carcinoma, ESRD) Signs/Symptoms: severe fat depletion, severe muscle depletion Interventions: MVI, Tube feeding    DVT prophylaxis:  heparin injection 5,000 Units Start: 04/19/22 2200 SCDs Start: 04/14/22 1640  Code Status: DNR/DNI Family Communication: None at bedside Level of care: Progressive Status is: Inpatient Remains inpatient appropriate because: Delirium/confusion and need for placement   Final disposition: SNF Consultants:  Pulmonology admitted patient Nephrology Palliative medicine  Sch Meds:  Scheduled Meds:  amLODipine  5 mg Oral Daily   Followed by   Derrill Memo ON 04/30/2022] amLODipine  10 mg Oral Daily   arformoterol  15 mcg Nebulization BID   budesonide (PULMICORT) nebulizer solution  0.25 mg Nebulization BID   calcium carbonate (dosed in mg elemental calcium)  500 mg of elemental calcium Per Tube BID   chlordiazePOXIDE  5 mg Per Tube BID   Chlorhexidine Gluconate Cloth  6 each Topical [id number]   folic acid  1 mg Intravenous Daily   heparin injection (subcutaneous)  5,000 Units Subcutaneous Q8H   lactulose  10 g Per  Tube QID   multivitamin  1 tablet Per Tube QHS   mouth rinse  15 mL Mouth Rinse 4 times per day   pantoprazole  40 mg Intravenous Q12H   revefenacin  175 mcg Nebulization Daily   Continuous Infusions:  anticoagulant sodium citrate     PRN Meds:.acetaminophen **OR** acetaminophen, albuterol, alteplase, anticoagulant sodium citrate, docusate sodium, guaiFENesin-dextromethorphan, haloperidol lactate, heparin, hydrALAZINE, lidocaine (PF), lidocaine-prilocaine, ondansetron **OR** ondansetron (ZOFRAN) IV, mouth rinse, pentafluoroprop-tetrafluoroeth, polyethylene glycol  Antimicrobials: Anti-infectives (From admission, onward)    Start     Dose/Rate Route Frequency Ordered Stop   04/18/22 1200  vancomycin (VANCOCIN) IVPB 1000 mg/200 mL premix  Status:  Discontinued        1,000 mg 200 mL/hr over 60 Minutes Intravenous Every M-W-F (Hemodialysis) 04/16/22 1428 04/17/22 0727   04/16/22 1000  remdesivir 100 mg in sodium chloride 0.9 % 100 mL IVPB       See Hyperspace for full Linked Orders Report.   100 mg 200 mL/hr over 30 Minutes Intravenous Daily 04/15/22 1244 04/17/22 1133   04/15/22 1445  vancomycin (VANCOREADY) IVPB 2000 mg/400 mL        2,000 mg 200 mL/hr over 120 Minutes Intravenous  Once 04/15/22 1354 04/15/22 1823   04/15/22 1354  vancomycin variable dose per unstable renal function (pharmacist dosing)  Status:  Discontinued         Does not apply See admin instructions 04/15/22 1354 04/17/22 1206   04/15/22 1330  remdesivir 200 mg in sodium chloride 0.9% 250 mL IVPB       See Hyperspace for full Linked Orders Report.   200 mg 580 mL/hr over 30 Minutes Intravenous Once 04/15/22 1244 04/15/22 1526   04/15/22 1330  cefTRIAXone (ROCEPHIN) 2 g in sodium chloride 0.9 % 100 mL IVPB  Status:  Discontinued        2 g 200 mL/hr over 30 Minutes Intravenous Every 24 hours 04/15/22 1244 04/22/22 1219   04/14/22 1445  cefTRIAXone (ROCEPHIN) 1 g in sodium chloride 0.9 % 100 mL IVPB        1  g 200 mL/hr over 30 Minutes Intravenous  Once 04/14/22 1438 04/14/22 1540        I have personally reviewed the following labs and images: CBC: Recent Labs  Lab 04/24/22 0332 04/25/22 1455 04/26/22 0113 04/27/22 1252 04/28/22 2210  WBC 7.7 7.9 6.6 7.9 6.6  NEUTROABS  --  5.7 5.2 6.8  --   HGB 8.6* 9.5* 10.2* 9.9* 10.8*  HCT 25.2* 27.9* 30.0* 28.7* 30.7*  MCV 95.1 96.2 96.2 96.3 94.2  PLT 91* 79* 99* 106* 117*   BMP &GFR Recent Labs  Lab 04/23/22 0639 04/24/22 0332 04/26/22 0113 04/27/22 1252 04/28/22 2210  NA 133* 134* 136 134* 133*  K 5.3* 4.3 4.2 4.8 4.3  CL 99 95* 95* 95* 93*  CO2 '22 27 28 25 25  '$ GLUCOSE 161* 126* 128* 154* 203*  BUN 58* 35* 40* 77* 52*  CREATININE 8.69* 5.06* 4.36* 6.13* 4.27*  CALCIUM 9.0 8.7* 8.9 9.0 8.4*  MG  --  2.0 1.9 2.2  --   PHOS  --  2.7 3.0 4.5 3.4   Estimated Creatinine Clearance: 21.7 mL/min (A) (by C-G formula based on SCr of 4.27 mg/dL (H)). Liver & Pancreas: Recent Labs  Lab 04/26/22 0113 04/27/22 1252 04/28/22 2210  ALBUMIN 1.9* 1.9* 1.9*   No results for input(s): "LIPASE", "AMYLASE" in the last 168 hours. No results for input(s): "AMMONIA" in the last 168 hours. Diabetic: No results for input(s): "HGBA1C" in the last 72 hours. Recent Labs  Lab 04/28/22 0947 04/28/22 1342 04/28/22 2120 04/29/22 0623 04/29/22 1135  GLUCAP 92 126* 158* 100* 109*   Cardiac Enzymes: No results for input(s): "CKTOTAL", "CKMB", "CKMBINDEX", "TROPONINI" in the last 168 hours. No results for input(s): "PROBNP" in the last 8760 hours. Coagulation Profile: No results for input(s): "INR", "PROTIME" in the last 168 hours. Thyroid Function Tests: No results for input(s): "TSH", "T4TOTAL", "FREET4", "T3FREE", "THYROIDAB" in the last 72 hours. Lipid Profile: No results for input(s): "CHOL", "HDL", "LDLCALC", "TRIG", "CHOLHDL", "LDLDIRECT" in the last 72 hours. Anemia Panel: No results for input(s): "VITAMINB12", "FOLATE", "FERRITIN",  "TIBC", "IRON", "RETICCTPCT" in the last 72 hours. Urine analysis:    Component Value Date/Time   COLORURINE YELLOW 12/09/2021 0900   APPEARANCEUR CLEAR 12/09/2021 0900   LABSPEC 1.008 12/09/2021 0900   PHURINE 5.0 12/09/2021 0900   GLUCOSEU NEGATIVE 12/09/2021 0900   HGBUR MODERATE (A) 12/09/2021 0900   BILIRUBINUR NEGATIVE 12/09/2021 0900   KETONESUR NEGATIVE 12/09/2021 0900   PROTEINUR 100 (A) 12/09/2021 0900   UROBILINOGEN 0.2 12/31/2009 0746   NITRITE NEGATIVE 12/09/2021 0900   LEUKOCYTESUR NEGATIVE 12/09/2021 0900   Sepsis Labs: Invalid input(s): "PROCALCITONIN", "LACTICIDVEN"  Microbiology: No results found for this or any previous visit (from  the past 240 hour(s)).  Radiology Studies: No results found.    Leisl Spurrier T. [place]  If 7PM-7AM, please contact night-coverage [email or web address] 04/29/2022, 4:16 PM

## 2022-04-29 NOTE — Progress Notes (Signed)
Occupational Therapy Evaluation Patient Details Name: Daniel [name] MRN: [id number] DOB: 04/17/1961 Today's Date: 04/29/2022   History of Present Illness Pt is a 62 y/o male presenting on 12/18 with symptomatic anemia. Noted pt missing multiple dialysis sessions due to transportation issues.  Found covid +. Admitted with GIB. Intubated 12/18 to 12/21, acute hypoxic respiratory failure. PMH includes: PAD, ESRD on HD, DM2, hepatocellular carcinoma s/p radiation, lung nodule, L BKA,   Clinical Impression   Patient admitted for above and presents with problem list below.  Limited session due to confusion and easily agitated. Pt oriented to self only, poor awareness to safety and deficits. He was able to complete bed mobility with supervision, wash face with setup assist but declined to engage in further ADLs or mobility.  Unable to provide PLOF, but previously pt was independent using prosthetic/RW or wc and living alone. Based on performance today, believe he will best benefit from continued OT services acutely and after dc at SNF level.  Will follow acutely.      Recommendations for follow up therapy are one component of a multi-disciplinary discharge planning process, led by the attending physician.  Recommendations may be updated based on patient status, additional functional criteria and insurance authorization.   Follow Up Recommendations  Skilled nursing-short term rehab (<3 hours/day)     Assistance Recommended at Discharge Frequent or constant Supervision/Assistance  Patient can return home with the following A lot of help with walking and/or transfers;A lot of help with bathing/dressing/bathroom;Assistance with cooking/housework;Direct supervision/assist for medications management;Direct supervision/assist for financial management;Assist for transportation;Help with stairs or ramp for entrance    Functional Status Assessment  Patient has had a recent decline in their functional  status and demonstrates the ability to make significant improvements in function in a reasonable and predictable amount of time.  Equipment Recommendations  Other (comment) (defer)    Recommendations for Other Services       Precautions / Restrictions Precautions Precautions: Fall Precaution Comments: lt BKA, prosthetic at home Restrictions Weight Bearing Restrictions: No      Mobility Bed Mobility Overal bed mobility: Needs Assistance Bed Mobility: Supine to Sit     Supine to sit: Supervision     General bed mobility comments: supervision for safety. Pt sat up EOB with RLE on floor and kept LLE up in bed. Refused to go any further.    Transfers                   General transfer comment: Pt confused and refused      Balance Overall balance assessment: Needs assistance Sitting-balance support: No upper extremity supported Sitting balance-Leahy Scale: Good                                     ADL either performed or assessed with clinical judgement   ADL Overall ADL's : Needs assistance/impaired     Grooming: Wash/dry face;Set up;Sitting                                 General ADL Comments: declined further ADls at this time     Vision         Perception     Praxis      Pertinent Vitals/Pain Pain Assessment Pain Assessment: Faces Faces Pain Scale: No hurt     Hand Dominance Right  Extremity/Trunk Assessment Upper Extremity Assessment Upper Extremity Assessment: Generalized weakness;Difficult to assess due to impaired cognition   Lower Extremity Assessment Lower Extremity Assessment: Defer to PT evaluation LLE Deficits / Details: BKA       Communication Communication Communication: No difficulties   Cognition Arousal/Alertness: Awake/alert Behavior During Therapy: Restless, Agitated Overall Cognitive Status: Impaired/Different from baseline Area of Impairment: Orientation, Attention, Memory, Following  commands, Safety/judgement, Awareness, Problem solving                 Orientation Level: Disoriented to, Place, Time, Situation Current Attention Level: Sustained Memory: Decreased short-term memory, Decreased recall of precautions Following Commands: Follows one step commands inconsistently, Follows one step commands with increased time Safety/Judgement: Decreased awareness of safety, Decreased awareness of deficits Awareness: Intellectual Problem Solving: Slow processing, Decreased initiation, Difficulty sequencing, Requires verbal cues General Comments: pt oriented to self only, reports planning to go to [employer] to get shoes and pants today.  When attempting further cognitive assessments, pt becomes agitated.  Inconsistent with following simple commands, but likely due to some agitation and not willing to participate.     General Comments  VSS on RA    Exercises     Shoulder Instructions      Home Living Family/patient expects to be discharged to:: Private residence Living Arrangements: Alone   Type of Home: House Home Access: Ramped entrance           Bathroom Shower/Tub: Occupational psychologist: Standard     Home Equipment: Wheelchair - Publishing copy (2 wheels)   Additional Comments: Information from prior encounter- pt unable to provide PLOF this session      Prior Functioning/Environment Prior Level of Function : Independent/Modified Independent;Patient poor historian/Family not available (from prior admission, pt unable to report today)             Mobility Comments: uses RW and prosthetic LE vs wheelchair for mobility ADLs Comments: MOD I with ADLs, sponge bathes at baseline due inability to get into shower. still drives and manages all IADLs per pt        OT Problem List: Decreased strength;Decreased activity tolerance;Impaired balance (sitting and/or standing);Decreased cognition;Decreased knowledge of precautions;Decreased  safety awareness;Decreased knowledge of use of DME or AE      OT Treatment/Interventions: Self-care/ADL training;Therapeutic exercise;DME and/or AE instruction;Therapeutic activities;Patient/family education;Cognitive remediation/compensation    OT Goals(Current goals can be found in the care plan section) Acute Rehab OT Goals Patient Stated Goal: none stated OT Goal Formulation: Patient unable to participate in goal setting Time For Goal Achievement: 05/13/22 Potential to Achieve Goals: Fair  OT Frequency: Min 2X/week    Co-evaluation PT/OT/SLP Co-Evaluation/Treatment: Yes Reason for Co-Treatment: Necessary to address cognition/behavior during functional activity;For patient/therapist safety   OT goals addressed during session: ADL's and self-care;Other (comment) (cognition)      AM-PAC OT "6 Clicks" Daily Activity     Outcome Measure Help from another person eating meals?: A Little Help from another person taking care of personal grooming?: A Little Help from another person toileting, which includes using toliet, bedpan, or urinal?: A Lot Help from another person bathing (including washing, rinsing, drying)?: A Lot Help from another person to put on and taking off regular upper body clothing?: A Little Help from another person to put on and taking off regular lower body clothing?: A Lot 6 Click Score: 15   End of Session Nurse Communication: Mobility status  Activity Tolerance: Treatment limited secondary to agitation Patient left:  in bed;with call bell/phone within reach;with bed alarm set  OT Visit Diagnosis: Other abnormalities of gait and mobility (R26.89);Other symptoms and signs involving cognitive function                Time: 3567-0141 OT Time Calculation (min): 13 min Charges:  OT General Charges $OT Visit: 1 Visit OT Evaluation $OT Eval Moderate Complexity: 1 Mod  Jolaine Artist, OT Acute Rehabilitation Services Office [phone]   Delight Stare 04/29/2022,  1:32 PM

## 2022-04-29 NOTE — Evaluation (Signed)
Physical Therapy Evaluation Patient Details Name: Daniel [name] MRN: [id number] DOB: October 09, 1960 Today's Date: 04/29/2022  History of Present Illness  Pt is a 62 y/o male presenting on 12/18 with symptomatic anemia. Noted pt missing multiple dialysis sessions due to transportation issues.  Found covid +. Admitted with GIB. Intubated 12/18 to 12/21, acute hypoxic respiratory failure. PMH includes: PAD, ESRD on HD, DM2, hepatocellular carcinoma s/p radiation, lung nodule, L BKA,  Clinical Impression  Pt presents to PT very confused and with limited participation due to the confusion. Expect pt may be able to move fairly well but unable to assess as pt became more irritated as tried to have him participate in mobility. At this time recommending SNF as per prior encounters pt lives alone. If mental status improves and his mobility is better then could reconsider dc plans.        Recommendations for follow up therapy are one component of a multi-disciplinary discharge planning process, led by the attending physician.  Recommendations may be updated based on patient status, additional functional criteria and insurance authorization.  Follow Up Recommendations Skilled nursing-short term rehab (<3 hours/day) Can patient physically be transported by private vehicle: Yes    Assistance Recommended at Discharge Frequent or constant Supervision/Assistance  Patient can return home with the following  A little help with walking and/or transfers;A little help with bathing/dressing/bathroom;Assistance with cooking/housework;Direct supervision/assist for medications management;Direct supervision/assist for financial management;Assist for transportation;Help with stairs or ramp for entrance    Equipment Recommendations None recommended by PT  Recommendations for Other Services       Functional Status Assessment Patient has had a recent decline in their functional status and demonstrates the ability to make  significant improvements in function in a reasonable and predictable amount of time.     Precautions / Restrictions Precautions Precautions: Fall Precaution Comments: lt BKA, prosthetic at home      Mobility  Bed Mobility Overal bed mobility: Needs Assistance Bed Mobility: Supine to Sit     Supine to sit: Supervision     General bed mobility comments: supervision for safety. Pt sat up EOB with RLE on floor and kept LLE up in bed. Refused to go any further.    Transfers                   General transfer comment: Pt confused and refused    Ambulation/Gait                  Stairs            Wheelchair Mobility    Modified Rankin (Stroke Patients Only)       Balance Overall balance assessment: Needs assistance Sitting-balance support: No upper extremity supported Sitting balance-Leahy Scale: Good                                       Pertinent Vitals/Pain Pain Assessment Pain Assessment: Faces Faces Pain Scale: No hurt    Home Living Family/patient expects to be discharged to:: Private residence Living Arrangements: Alone   Type of Home: House Home Access: Ramped entrance         Home Equipment: Wheelchair - Publishing copy (2 wheels) Additional Comments: Information from prior encounter    Prior Function Prior Level of Function : Independent/Modified Independent;Patient poor historian/Family not available (Information from prior encounter)  Mobility Comments: uses RW and prosthetic LE vs wheelchair for mobility ADLs Comments: MOD I with ADLs, sponge bathes at baseline due inability to get into shower. still drives and manages all IADLs per pt     Hand Dominance   Dominant Hand: Right    Extremity/Trunk Assessment   Upper Extremity Assessment Upper Extremity Assessment: Defer to OT evaluation    Lower Extremity Assessment Lower Extremity Assessment: Generalized weakness;LLE  deficits/detail LLE Deficits / Details: BKA       Communication   Communication: No difficulties  Cognition Arousal/Alertness: Awake/alert Behavior During Therapy: Restless Overall Cognitive Status: Impaired/Different from baseline Area of Impairment: Orientation, Attention, Memory, Following commands, Safety/judgement, Awareness, Problem solving                 Orientation Level: Disoriented to, Place, Time, Situation Current Attention Level: Sustained Memory: Decreased short-term memory, Decreased recall of precautions Following Commands:  (refusing to cooperate) Safety/Judgement: Decreased awareness of safety, Decreased awareness of deficits Awareness: Intellectual   General Comments: Pt confused and unwilling to fully participate. Pt oriented only to person. Easily irritated.        General Comments General comments (skin integrity, edema, etc.): VSS on RA    Exercises     Assessment/Plan    PT Assessment Patient needs continued PT services  PT Problem List Decreased strength;Decreased mobility;Decreased cognition;Decreased safety awareness       PT Treatment Interventions DME instruction;Gait training;Functional mobility training;Therapeutic activities;Therapeutic exercise;Balance training;Cognitive remediation;Patient/family education    PT Goals (Current goals can be found in the Care Plan section)  Acute Rehab PT Goals Patient Stated Goal: pt unable PT Goal Formulation: Patient unable to participate in goal setting Time For Goal Achievement: 05/13/22 Potential to Achieve Goals: Good    Frequency Min 3X/week     Co-evaluation PT/OT/SLP Co-Evaluation/Treatment: Yes Reason for Co-Treatment: Necessary to address cognition/behavior during functional activity;For patient/therapist safety           AM-PAC PT "6 Clicks" Mobility  Outcome Measure Help needed turning from your back to your side while in a flat bed without using bedrails?: None Help  needed moving from lying on your back to sitting on the side of a flat bed without using bedrails?: None Help needed moving to and from a bed to a chair (including a wheelchair)?: Total Help needed standing up from a chair using your arms (e.g., wheelchair or bedside chair)?: Total Help needed to walk in hospital room?: Total Help needed climbing 3-5 steps with a railing? : Total 6 Click Score: 12    End of Session   Activity Tolerance: Other (comment) (Limited by confusion and refusal) Patient left: in bed;with call bell/phone within reach;with bed alarm set Nurse Communication: Mobility status PT Visit Diagnosis: Other abnormalities of gait and mobility (R26.89);Muscle weakness (generalized) (M62.81);Difficulty in walking, not elsewhere classified (R26.2)    Time: 0912-0926 PT Time Calculation (min) (ACUTE ONLY): 14 min   Charges:   PT Evaluation $PT Eval Low Complexity: [place] [facility] [place] 04/29/2022, 11:01 AM

## 2022-04-29 NOTE — Progress Notes (Signed)
Patient ID: Daniel [name], male   DOB: Nov 18, 1960, 62 y.o.   MRN: [id number]    Progress Note from the Palliative Medicine Team at [facility]   Patient Name: Daniel [name]        Date: 04/29/2022 DOB: 1960/08/20  Age: 62 y.o. MRN#: [id number] Attending Physician: Mercy Riding, MD Primary Care Physician: [name], NP Admit Date: 04/14/2022   Medical records reviewed   62 year old gentleman, past medical history of peripheral arterial disease, dialysis patient Monday, end-stage renal disease, type 2 diabetes hepatocellular carcinoma status post radiation. seen in pulm clinic before for a lung nodule which on bronchoscopic eval / path was not c/w malignancy .  He has had GI bleeding in the past with 2 duodenal ulcers a AVM alcoholic cirrhosis with his known history hepatocellular carcinoma.  In the ER he was also found to be COVID-positive.   He ultimately had a large volume of bloody emesis in the emergency room he had respiratory failure and was intubated.  Pulmonary critical care was consulted for ICU was consulted for admission.  He had already been seen by hospitalist and admitted with GI consultation and IR but declined while he was in the Emergency department".      This NP assessed patient at the bedside as a follow up for palliative medicine needs and emotional support.  I was able to speak with his sister/ [name] by telephone.  She is concerned with the next steps for her brother's care.   He no longer has housing and cannnot live with family.      Plan of Care: -DNR/DNI -continue with current treatment plan  -transition to SNF for short term rehabilitation     Education offered today regarding  the importance of continued conversation with family and their  medical providers regarding overall plan of care and treatment options,  ensuring decisions are within the context of the patients values and GOCs.  Questions and concerns addressed    Discussed with Dr Cleotis Lema NP  Palliative Medicine Team Team Phone # [phone] Pager [phone]

## 2022-04-29 NOTE — Progress Notes (Signed)
Nutrition Follow-up  DOCUMENTATION CODES:   Severe malnutrition in context of chronic illness  INTERVENTION:  - Continue Regular diet.   NUTRITION DIAGNOSIS:   Severe Malnutrition related to chronic illness (alcoholic cirrhosis, hepatocellular carcinoma, ESRD) as evidenced by severe fat depletion, severe muscle depletion.  GOAL:   Patient will meet greater than or equal to 90% of their needs  MONITOR:   Diet advancement, Labs, Weight trends, TF tolerance, I & O's  REASON FOR ASSESSMENT:   Consult Assessment of nutrition requirement/status  ASSESSMENT:   62 year old male who presented to the ED on 12/18 with hematemesis. PMH of recurrent GI bleed from AVMs, alcoholic cirrhosis, hepatocellular carcinoma, ESRD on HD, T2DM, HTN, ICH in 2019, PAD s/p L BKA, chronic anemia, ETOH abuse. Pt required intubation in the ED and tested positive for COVID-19. Pt admitted with recurrent GI bleed, COVID-19 PNA.  Meds reviewed: calcium carbonate, folic acid, sliding scale insulin, lactulose, rena-vit. Labs reviewed: Na low, BUN/Creatinine high.   Pt no longer with cortrak tube in place. Pt is now on a regular diet. Pt reports that he has been eating well. No intakes documented per record. RD will continue to monitor PO intakes.   Diet Order:   Diet Order             Diet regular Room service appropriate? No; Fluid consistency: Thin  Diet effective now                   EDUCATION NEEDS:   Not appropriate for education at this time  Skin:  Skin Assessment: Reviewed RN Assessment  Last BM:  04/28/22 (fecal management system)  Height:   Ht Readings from Last 1 Encounters:  04/21/22 '5\' 11"'$  (1.803 m)    Weight:   Wt Readings from Last 1 Encounters:  04/28/22 100.6 kg    Ideal Body Weight:  78.2 kg  BMI:  Body mass index is 30.93 kg/m.  Estimated Nutritional Needs:   Kcal:  2250-2450  Protein:  110-130 grams  Fluid:  1L plus UOP  Adiana Smelcer Graciela Husbands, RD, LDN, CNSC.

## 2022-04-29 NOTE — Progress Notes (Signed)
[facility] Progress Note   Subjective:   HD last night-  removed 3000-  tolerated well  He is alert but does not recognize me I dont think  Objective Vitals:   04/29/22 0022 04/29/22 0336 04/29/22 0700 04/29/22 0750  BP: 137/78 (!) 155/81  (!) 142/84  Pulse: 84 78    Resp: '17 16  14  '$ Temp: 98 F (36.7 C) 98.3 F (36.8 C)  97.7 F (36.5 C)  TempSrc: Oral Oral  Oral  SpO2: 98% 100% 91%   Weight:      Height:       Physical Exam General: Alert male in NAD-  confused  Heart: RRR, no murmurs, rubs or gallops Lungs: On high flow [place], respirations unlabored, CTA anteriorly Abdomen: Soft, non-distended, +BS Extremities: No edema b/l lower extremities Dialysis Access: LUE AVF + bruit  Additional Objective Labs: Basic Metabolic Panel: Recent Labs  Lab 04/26/22 0113 04/27/22 1252 04/28/22 2210  NA 136 134* 133*  K 4.2 4.8 4.3  CL 95* 95* 93*  CO2 '28 25 25  '$ GLUCOSE 128* 154* 203*  BUN 40* 77* 52*  CREATININE 4.36* 6.13* 4.27*  CALCIUM 8.9 9.0 8.4*  PHOS 3.0 4.5 3.4   Liver Function Tests: Recent Labs  Lab 04/26/22 0113 04/27/22 1252 04/28/22 2210  ALBUMIN 1.9* 1.9* 1.9*   No results for input(s): "LIPASE", "AMYLASE" in the last 168 hours. CBC: Recent Labs  Lab 04/24/22 0332 04/25/22 1455 04/26/22 0113 04/27/22 1252 04/28/22 2210  WBC 7.7 7.9 6.6 7.9 6.6  NEUTROABS  --  5.7 5.2 6.8  --   HGB 8.6* 9.5* 10.2* 9.9* 10.8*  HCT 25.2* 27.9* 30.0* 28.7* 30.7*  MCV 95.1 96.2 96.2 96.3 94.2  PLT 91* 79* 99* 106* 117*   Blood Culture    Component Value Date/Time   SDES TRACHEAL ASPIRATE 04/15/2022 1622   SPECREQUEST NONE 04/15/2022 1622   CULT  04/15/2022 1622    RARE Normal respiratory flora-no Staph aureus or Pseudomonas seen Performed at [facility], [place] [address], [place], [place] [address]    REPTSTATUS 04/18/2022 FINAL 04/15/2022 1622    Cardiac Enzymes: No results for input(s): "CKTOTAL", "CKMB", "CKMBINDEX", "TROPONINI" in  the last 168 hours. CBG: Recent Labs  Lab 04/28/22 0359 04/28/22 0947 04/28/22 1342 04/28/22 2120 04/29/22 0623  GLUCAP 129* 92 126* 158* 100*   Iron Studies: No results for input(s): "IRON", "TIBC", "TRANSFERRIN", "FERRITIN" in the last 72 hours. '@lablastinr3'$ @ Studies/Results: No results found. Medications:  anticoagulant sodium citrate      amLODipine  5 mg Per Tube Daily   arformoterol  15 mcg Nebulization BID   budesonide (PULMICORT) nebulizer solution  0.25 mg Nebulization BID   calcium carbonate (dosed in mg elemental calcium)  500 mg of elemental calcium Per Tube BID   chlordiazePOXIDE  5 mg Per Tube BID   Chlorhexidine Gluconate Cloth  6 each Topical K1601   folic acid  1 mg Intravenous Daily   heparin injection (subcutaneous)  5,000 Units Subcutaneous Q8H   insulin aspart  0-5 Units Subcutaneous QHS   insulin aspart  0-6 Units Subcutaneous TID WC   lactulose  10 g Per Tube QID   multivitamin  1 tablet Per Tube QHS   mouth rinse  15 mL Mouth Rinse 4 times per day   pantoprazole  40 mg Intravenous Q12H   revefenacin  175 mcg Nebulization Daily    OP Dialysis Orders:  MWF [place] 4h  400/A1.5  98.8kg   2K/2Ca bat   LUE AVF  Heparin none - Hectoral 73mg IV q HD - Appears to get IV iron with oncology, no ESA d/t cance  Assessment/Plan:  Acute hypoxic respiratory failure - following large volume emesis of blood and intubated in the ED.  Self-extubated 04/17/22, on HFNC but has been titrated down. plan per PCCM.  ABLA - acute on chronic with evidence of GI bleed.  Known AVM's.  Gi consulted. On octreotide and protonix.  Transfuse prn. Hgb stable to improved  Covid-19 Positive -  Quarantined for 5 days per protocol.  per pmd  ESRD - Typically on  HD MWF. Had HD Friday with net UF 4L. Then Monday -  removed 3 liters, via AVF resuming MWF schedule so next will be on Wed  Hypertension/volume  - last CXR 12/23 diffuse bilat infiltrates. Not sure vol vs aspiration pna vs  other. No edema on exam but BP's have been up. Tolerated 3-4L UF with HD, will continue max UF goals as tolerated.   Anemia  - Multiple transfusions this admission, last Hgb 10.8. Not on ESA due to hepatic cancer.   Metabolic bone disease -  Corrected calcium 10.6. VDRA on hold. Phosphorus is at goal. Changed calcium carbonate from TID to BID today  Nutrition - has NG tube  Medical noncompliance - multiple admissions with ongoing issue  Hepatocellular carcinoma - s/p Y90 on 11/28/21.  KLouis Meckel 04/29/2022, 9:49 AM  [employer]

## 2022-04-30 ENCOUNTER — Inpatient Hospital Stay
Admission: RE | Admit: 2022-04-30 | Discharge: 2022-04-30 | Disposition: A | Discharge status: Home / Self Care | Payer: Medicare Other | Source: Ambulatory Visit | Attending: Interventional Radiology | Admitting: Interventional Radiology

## 2022-04-30 DIAGNOSIS — C220 Liver cell carcinoma: Secondary | ICD-10-CM

## 2022-04-30 DIAGNOSIS — D62 Acute posthemorrhagic anemia: Secondary | ICD-10-CM | POA: Diagnosis not present

## 2022-04-30 DIAGNOSIS — K920 Hematemesis: Secondary | ICD-10-CM | POA: Diagnosis not present

## 2022-04-30 DIAGNOSIS — G9340 Encephalopathy, unspecified: Secondary | ICD-10-CM | POA: Diagnosis not present

## 2022-04-30 DIAGNOSIS — E118 Type 2 diabetes mellitus with unspecified complications: Secondary | ICD-10-CM | POA: Diagnosis not present

## 2022-04-30 LAB — CBC
HCT: 29.8 % — ABNORMAL LOW (ref 39.0–52.0)
Hemoglobin: 10.1 g/dL — ABNORMAL LOW (ref 13.0–17.0)
MCH: 32.6 pg (ref 26.0–34.0)
MCHC: 33.9 g/dL (ref 30.0–36.0)
MCV: 96.1 fL (ref 80.0–100.0)
Platelets: 111 10*3/uL — ABNORMAL LOW (ref 150–400)
RBC: 3.1 MIL/uL — ABNORMAL LOW (ref 4.22–5.81)
RDW: 17.3 % — ABNORMAL HIGH (ref 11.5–15.5)
WBC: 8.1 10*3/uL (ref 4.0–10.5)
nRBC: 0 % (ref 0.0–0.2)

## 2022-04-30 LAB — RENAL FUNCTION PANEL
Albumin: 1.9 g/dL — ABNORMAL LOW (ref 3.5–5.0)
Anion gap: 15 (ref 5–15)
BUN: 91 mg/dL — ABNORMAL HIGH (ref 8–23)
CO2: 23 mmol/L (ref 22–32)
Calcium: 8.8 mg/dL — ABNORMAL LOW (ref 8.9–10.3)
Chloride: 95 mmol/L — ABNORMAL LOW (ref 98–111)
Creatinine, Ser: 6.51 mg/dL — ABNORMAL HIGH (ref 0.61–1.24)
GFR, Estimated: 9 mL/min — ABNORMAL LOW (ref >60)
Glucose, Bld: 185 mg/dL — ABNORMAL HIGH (ref 70–99)
Phosphorus: 4.6 mg/dL (ref 2.5–4.6)
Potassium: 4.3 mmol/L (ref 3.5–5.1)
Sodium: 133 mmol/L — ABNORMAL LOW (ref 135–145)

## 2022-04-30 LAB — GLUCOSE, CAPILLARY
Glucose-Capillary: 129 mg/dL — ABNORMAL HIGH (ref 70–99)
Glucose-Capillary: 97 mg/dL (ref 70–99)

## 2022-04-30 MED ORDER — FOLIC ACID 1 MG PO TABS
1.0000 mg | ORAL_TABLET | Freq: Every day | ORAL | Status: DC
  Administered 2022-04-30 – 2022-05-05 (×6): 1 mg via ORAL
  Filled 2022-04-30 (×6): qty 1

## 2022-04-30 MED ORDER — PANTOPRAZOLE SODIUM 40 MG PO TBEC
40.0000 mg | DELAYED_RELEASE_TABLET | Freq: Two times a day (BID) | ORAL | Status: DC
  Administered 2022-04-30 – 2022-05-05 (×8): 40 mg via ORAL
  Filled 2022-04-30 (×8): qty 1

## 2022-04-30 MED ORDER — CHLORDIAZEPOXIDE HCL 5 MG PO CAPS
5.0000 mg | ORAL_CAPSULE | Freq: Two times a day (BID) | ORAL | Status: AC
  Administered 2022-04-30: 5 mg via ORAL
  Filled 2022-04-30: qty 1

## 2022-04-30 MED ORDER — GUAIFENESIN-DM 100-10 MG/5ML PO SYRP: 5.0000 mL | ORAL_SOLUTION | ORAL | Status: DC | PRN

## 2022-04-30 MED ORDER — LACTULOSE 10 GM/15ML PO SOLN
10.0000 g | Freq: Four times a day (QID) | ORAL | Status: DC
  Administered 2022-04-30 – 2022-05-05 (×14): 10 g via ORAL
  Filled 2022-04-30 (×15): qty 15

## 2022-04-30 MED ORDER — CALCIUM CARBONATE ANTACID 1250 MG/5ML PO SUSP
500.0000 mg | Freq: Two times a day (BID) | ORAL | Status: DC
  Administered 2022-04-30 – 2022-05-05 (×12): 500 mg via ORAL
  Filled 2022-04-30 (×12): qty 5

## 2022-04-30 MED ORDER — RENA-VITE PO TABS
1.0000 | ORAL_TABLET | Freq: Every day | ORAL | Status: DC
  Administered 2022-04-30 – 2022-05-05 (×6): 1 via ORAL
  Filled 2022-04-30 (×6): qty 1

## 2022-04-30 NOTE — Progress Notes (Signed)
Pt receives out-pt HD at [facility] on MWF. Pt has an 11:10 chair time. Will assist as needed.   Melven Sartorius Renal Navigator [phone]

## 2022-04-30 NOTE — Progress Notes (Signed)
   04/30/22 1825  Pain Assessment  Pain Scale 0-10  Pain Score 0  Neurological  Level of Consciousness Alert  Orientation Level Oriented to person;Disoriented to time;Disoriented to situation;Oriented to place  Respiratory  Respiratory Pattern Regular;Unlabored  Chest Assessment Chest expansion symmetrical  Bilateral Breath Sounds Clear;Diminished  R Upper  Breath Sounds Clear  L Upper Breath Sounds Clear  R Lower Breath Sounds Diminished  L Lower Breath Sounds Diminished  Cough None   Received patient in bed to unit.  Alert and oriented.  Informed consent signed and in chart.   Treatment initiated: 1444p Treatment completed: 1821p  Patient tolerated well.  Transported back to the room  Alert, without acute distress.  Hand-off given to patient's nurse.   Access used: Yes Access issues: No   Total UF removed: 1800 Medication(s) given: None Post HD VS: 98.7, 87, 18, 96/59, Spo2 96 R/A Post HD weight: 96.4 kg   [facility]

## 2022-04-30 NOTE — Progress Notes (Signed)
[facility] Progress Note   Subjective:    Last HD on 1/1 with 3 kg UF.  Spoke with primary team at bedside this am - team is concerned about his insight.  Pt states that his brother and sister help him with medical or other decisions when needed.     Review of systems:  Denies shortness of breath  Denies n/v Limited by ams   Objective Vitals:   04/30/22 0612 04/30/22 0636 04/30/22 0814 04/30/22 0831  BP: 138/66  (!) 147/80   Pulse: 80     Resp: 18     Temp: 97.8 F (36.6 C)  97.7 F (36.5 C)   TempSrc: Oral  Oral   SpO2: 94%   99%  Weight:  101.4 kg    Height:       Physical Exam   General adult male in bed in no acute distress HEENT normocephalic atraumatic extraocular movements intact sclera anicteric Neck supple trachea midline Lungs clear to auscultation bilaterally normal work of breathing at rest  Heart S1S2 no rub Abdomen soft nontender distended Extremities no edema  Psych no anxiety or agitation  Neuro alert and oriented to person and location of hospital - states year is 2020 Access LUE AVF bruit and thrill    Additional Objective Labs: Basic Metabolic Panel: Recent Labs  Lab 04/26/22 0113 04/27/22 1252 04/28/22 2210  NA 136 134* 133*  K 4.2 4.8 4.3  CL 95* 95* 93*  CO2 '28 25 25  '$ GLUCOSE 128* 154* 203*  BUN 40* 77* 52*  CREATININE 4.36* 6.13* 4.27*  CALCIUM 8.9 9.0 8.4*  PHOS 3.0 4.5 3.4   Liver Function Tests: Recent Labs  Lab 04/26/22 0113 04/27/22 1252 04/28/22 2210  ALBUMIN 1.9* 1.9* 1.9*   No results for input(s): "LIPASE", "AMYLASE" in the last 168 hours. CBC: Recent Labs  Lab 04/24/22 0332 04/25/22 1455 04/26/22 0113 04/27/22 1252 04/28/22 2210  WBC 7.7 7.9 6.6 7.9 6.6  NEUTROABS  --  5.7 5.2 6.8  --   HGB 8.6* 9.5* 10.2* 9.9* 10.8*  HCT 25.2* 27.9* 30.0* 28.7* 30.7*  MCV 95.1 96.2 96.2 96.3 94.2  PLT 91* 79* 99* 106* 117*   Blood Culture    Component Value Date/Time   SDES TRACHEAL ASPIRATE  04/15/2022 1622   SPECREQUEST NONE 04/15/2022 1622   CULT  04/15/2022 1622    RARE Normal respiratory flora-no Staph aureus or Pseudomonas seen Performed at [facility], [place] [address], [place],  [place] [address]    REPTSTATUS 04/18/2022 FINAL 04/15/2022 1622    Cardiac Enzymes: No results for input(s): "CKTOTAL", "CKMB", "CKMBINDEX", "TROPONINI" in the last 168 hours. CBG: Recent Labs  Lab 04/29/22 0623 04/29/22 1135 04/29/22 2028 04/30/22 0000 04/30/22 0635  GLUCAP 100* 109* 174* 129* 97   Iron Studies: No results for input(s): "IRON", "TIBC", "TRANSFERRIN", "FERRITIN" in the last 72 hours. '@lablastinr3'$ @ Studies/Results: No results found. Medications:  anticoagulant sodium citrate      amLODipine  10 mg Oral Daily   arformoterol  15 mcg Nebulization BID   budesonide (PULMICORT) nebulizer solution  0.25 mg Nebulization BID   calcium carbonate (dosed in mg elemental calcium)  500 mg of elemental calcium Per Tube BID   chlordiazePOXIDE  5 mg Per Tube BID   Chlorhexidine Gluconate Cloth  6 each Topical G8366   folic acid  1 mg Intravenous Daily   heparin injection (subcutaneous)  5,000 Units Subcutaneous Q8H   lactulose  10 g Per Tube  QID   multivitamin  1 tablet Per Tube QHS   mouth rinse  15 mL Mouth Rinse 4 times per day   pantoprazole  40 mg Intravenous Q12H   revefenacin  175 mcg Nebulization Daily    OP Dialysis Orders:  MWF [place] 4h  400/A1.5    98.8kg   2K/2Ca bat   LUE AVF  Heparin none - Hectoral 69mg IV q HD - Appears to get IV iron with oncology, no ESA d/t cance  Assessment/Plan:  Acute hypoxic respiratory failure - following large volume emesis of blood and was intubated in the ED.  Self-extubated 04/17/22, on HFNC which has been titrated down.  Per primary team. We are optimizing volume with HD ABLA - acute on chronic with evidence of GI bleed.  Known AVM's.  GI consulted. s/p octreotide and on PPI BID.  Transfuse prn. Hb improved.   Covid-19  Positive -  Quarantined for 5 days per protocol.  per pmd.  Per charting he tested positive for covid on 12/17  ESRD - Continue HD per MWF schedule - using AVF.  Will follow up labs - not yet drawn  Hypertension/volume  - CXR 12/23 diffuse bilat infiltrates. Not sure vol vs aspiration pna vs other. No edema on exam but BP's have been up. optimize UF with HD   Anemia  - Multiple transfusions this admission, last Hgb 10.8. Not on ESA due to hepatic cancer.   Metabolic bone disease -  Corrected calcium elevated thus VDRA was placed on hold. Phosphorus is at goal. Changed calcium carbonate from TID to BID   Medical noncompliance - multiple admissions - ongoing issue  Hepatocellular carcinoma - s/p Y90 on 11/28/21.  Disposition - per primary team.  Per charting he is inpatient due to the need for placement   LClaudia Desanctis MD 04/30/2022, 9:56 AM [facility]

## 2022-04-30 NOTE — TOC Progression Note (Signed)
Transition of Care [facility]) - Progression Note    Patient Details  Name: Daniel [name] MRN: [id number] Date of Birth: Jun 05, 1960  Transition of Care [facility]) CM/SW [place], [place] Phone Number: 04/30/2022, 12:24 PM  Clinical Narrative:     Called patient sister,[name], unable to leave message because voice mailbox was full.  Expected Discharge Plan: [place] Barriers to Discharge: Continued Medical Work up  Expected Discharge Plan and Services In-house Referral: NA Discharge Planning Services: CM Consult Post Acute Care Choice: Home Health, Resumption of Svcs/PTA Provider Living arrangements for the past 2 months: Single Family Home                 DME Arranged: N/A         HH Arranged: RN, Disease Management, PT, OT HH Agency: [facility] Date [place]: 04/15/22 Time [place]: [phone] Representative spoke with at [place]: Kerry Dory.   Social Determinants of Health ([place]) Interventions SDOH Screenings   Food Insecurity: No Food Insecurity (04/06/2022)  Housing: Low Risk  (04/06/2022)  Recent Concern: Housing - High Risk (03/25/2022)  Transportation Needs: No Transportation Needs (04/06/2022)  Recent Concern: Transportation Needs - Unmet Transportation Needs (03/04/2022)  Utilities: Not At Risk (04/06/2022)  Alcohol Screen: Low Risk  (11/01/2021)  Depression (PHQ2-9): Low Risk  (12/31/2021)  Financial Resource Strain: High Risk (11/01/2021)  Physical Activity: Insufficiently Active (11/01/2021)  Social Connections: Socially Isolated (11/01/2021)  Stress: No Stress Concern Present (11/01/2021)  Tobacco Use: High Risk (04/14/2022)    Readmission Risk Interventions    10/10/2021   10:49 AM 02/27/2021   12:51 PM  Readmission Risk Prevention Plan  Transportation Screening Complete Complete  Medication Review Press photographer) Complete Complete  PCP or Specialist appointment within 3-5 days of discharge Complete  Complete  HRI or [place] Complete Complete  SW Recovery Care/Counseling Consult Complete Complete  Palliative Care Screening Complete Not [place] Patient Refused Not Applicable

## 2022-04-30 NOTE — Progress Notes (Signed)
PROGRESS NOTE  Daniel [name] WLN:[id number] DOB: 1960/12/14   PCP: [name], NP  Patient is from: Home.  DOA: 04/14/2022 LOS: 74  Chief complaints Chief Complaint  Patient presents with   Generalized Body Aches   Cough     Brief Narrative / Interim history: 62 year old M with PMH of PAD s/p left BKA, ESRD on HD MWF, DM-2, EtOH cirrhosis, HCC, GI bleed, duodenal ulcer and AVM returning with hematemesis on 12/18 after he left AMA on 12/17.  Tested positive for COVID on 12/17 after he presented with generalized body symptoms and left AMA.  He returned on 12/18 with hematemesis and intubated.  He self extubated on 12/21.  CODE STATUS changed to DNR/DNI on 12/24.  He was transfused 2 units of blood on 04/24/2022.  Eventually, he was stabilized and transferred to hospitalist service.   Hospital course complicated by delirium.  Respiratory failure resolved.  Nephrology and palliative medicine following.  Therapy recommended SNF.  Medically optimized for discharge  Subjective: Seen and examined earlier this morning.  No major events overnight of this morning.  Eager to go home but I think he understands the situation.  He is oriented to self and place but not time.  He is asking for tennis shoes.   Objective: Vitals:   04/30/22 0636 04/30/22 0814 04/30/22 0831 04/30/22 1137  BP:  (!) 147/80  (!) 153/84  Pulse:      Resp:    18  Temp:  97.7 F (36.5 C)  (!) 97.5 F (36.4 C)  TempSrc:  Oral  Oral  SpO2:   99%   Weight: 101.4 kg     Height:        Examination:  GENERAL: No apparent distress.  Nontoxic. HEENT: MMM.  Vision and hearing grossly intact.  NECK: Supple.  No apparent JVD.  RESP:  No IWOB.  Fair aeration bilaterally. CVS:  RRR. Heart sounds normal.  ABD/GI/GU: BS+. Abd soft, NTND.  MSK/EXT:  Moves extremities.  Left BKA. SKIN: no apparent skin lesion or wound NEURO: Awake and alert.  Oriented to self and place.  Follows commands but limited  insight. PSYCH: Calm. Normal affect.   Procedures:  ETT from 12/18-12/21  Microbiology summarized: 12/19-MRSA PCR screen and respiratory cultures negative.  Assessment and plan: Principal Problem:   GIB (gastrointestinal bleeding) Active Problems:   ABLA (acute blood loss anemia)   Alcohol abuse   ESRD (end stage renal disease) (HCC)   Acute encephalopathy   Thrombocytopenia (HCC)   Peptic ulcer disease   Diabetes mellitus type 2, controlled, with complications ([place])   Physical deconditioning   Iron deficiency anemia due to chronic blood loss   Acute on chronic anemia   Cirrhosis of liver with ascites (HCC)   Symptomatic anemia   Pneumonia due to COVID-19 virus   Acute on chronic respiratory failure (HCC)   Protein-calorie malnutrition, severe   Acute blood loss anemia due to upper GI bleed/hematemesis: Has EtOH cirrhosis, duodenal ulcer, AVM, portal gastropathy and G1 EV-H&H stable since she received 2 units on 12/28. Recent Labs    04/20/22 0632 04/21/22 0658 04/22/22 0620 04/23/22 0520 04/24/22 0332 04/25/22 1455 04/26/22 0113 04/27/22 1252 04/28/22 2210 04/30/22 1009  HGB 7.4* 7.3* 7.1* 6.7* 8.6* 9.5* 10.2* 9.9* 10.8* 10.1*  -Continue PPI -Encouraged alcohol cessation.  Acute respiratory failure with hypoxia in the setting of COVID-19 infection, pneumonia and pulmonary edema: Intubated on 12/18 and self extubated on 12/21.  Currently on room air, DNR  and DNI. -Completed 7 days of ceftriaxone.  Briefly received vancomycin as well. -Completed steroid course for COVID-19. -Fluid management by HD per nephrology -Continue breathing treatments   ESRD on iHD MWF, poorly compliant as an outpatient Hyponatremia, hyperkalemia, hyperphosphatemia -Nephrology managing.   Thrombocytopenia-chronic due to cirrhosis: Improving. Recent Labs  Lab 04/24/22 0332 04/25/22 1455 04/26/22 0113 04/27/22 1252 04/28/22 2210 04/30/22 1009  PLT 91* 79* 99* 106* 117* 111*   -Monitor intermittently  History of EtOH cirrhosis/HCC -Outpatient follow-up.   Uncontrolled hypertension: BP elevated. -Increased amlodipine to 10 mg daily. -Change as needed hydralazine to p.o. -Fluid management by HD.   Acute metabolic encephalopathy/delirium: Should be outside withdrawal window for alcohol by now.  Oriented to self and place but limited insight.  Improving. -Reorientation and delirium precaution. -IV Haldol 2 mg every 6 hours as needed.  No prolonged QT on EKG. -Continue weaning of Librium taper -Optimize electrolytes.  Controlled NIDDM-2: A1c 5.1%.  CBG fairly controlled. -Discontinue CBG monitoring and SSI insulin  Physical deconditioning/left BKA -Therapy recommended SNF  Goal of care: DNR/DNI. -Palliative following.  Severe protein calorie malnutrition in the setting of alcohol abuse and chronic disease Body mass index is 31.18 kg/m. Nutrition Problem: Severe Malnutrition Etiology: chronic illness (alcoholic cirrhosis, hepatocellular carcinoma, ESRD) Signs/Symptoms: severe fat depletion, severe muscle depletion Interventions: MVI, Tube feeding    DVT prophylaxis:  heparin injection 5,000 Units Start: 04/19/22 2200 SCDs Start: 04/14/22 1640  Code Status: DNR/DNI Family Communication: None at bedside Level of care: Med-Surg Status is: Inpatient Remains inpatient appropriate because: SNF bed.     Final disposition: SNF Consultants:  Pulmonology admitted patient Nephrology Palliative medicine  Sch Meds:  Scheduled Meds:  amLODipine  10 mg Oral Daily   arformoterol  15 mcg Nebulization BID   budesonide (PULMICORT) nebulizer solution  0.25 mg Nebulization BID   calcium carbonate (dosed in mg elemental calcium)  500 mg of elemental calcium Oral BID   Chlorhexidine Gluconate Cloth  6 each Topical T7001   folic acid  1 mg Oral Daily   heparin injection (subcutaneous)  5,000 Units Subcutaneous Q8H   lactulose  10 g Oral QID   multivitamin   1 tablet Oral QHS   mouth rinse  15 mL Mouth Rinse 4 times per day   pantoprazole  40 mg Oral BID AC   revefenacin  175 mcg Nebulization Daily   Continuous Infusions:  anticoagulant sodium citrate     PRN Meds:.acetaminophen **OR** acetaminophen, albuterol, alteplase, anticoagulant sodium citrate, docusate sodium, guaiFENesin-dextromethorphan, haloperidol lactate, heparin, hydrALAZINE, lidocaine (PF), lidocaine-prilocaine, ondansetron **OR** ondansetron (ZOFRAN) IV, mouth rinse, pentafluoroprop-tetrafluoroeth, polyethylene glycol  Antimicrobials: Anti-infectives (From admission, onward)    Start     Dose/Rate Route Frequency Ordered Stop   04/18/22 1200  vancomycin (VANCOCIN) IVPB 1000 mg/200 mL premix  Status:  Discontinued        1,000 mg 200 mL/hr over 60 Minutes Intravenous Every M-W-F (Hemodialysis) 04/16/22 1428 04/17/22 0727   04/16/22 1000  remdesivir 100 mg in sodium chloride 0.9 % 100 mL IVPB       See Hyperspace for full Linked Orders Report.   100 mg 200 mL/hr over 30 Minutes Intravenous Daily 04/15/22 1244 04/17/22 1133   04/15/22 1445  vancomycin (VANCOREADY) IVPB 2000 mg/400 mL        2,000 mg 200 mL/hr over 120 Minutes Intravenous  Once 04/15/22 1354 04/15/22 1823   04/15/22 1354  vancomycin variable dose per unstable renal function (pharmacist dosing)  Status:  Discontinued         Does not apply See admin instructions 04/15/22 1354 04/17/22 1206   04/15/22 1330  remdesivir 200 mg in sodium chloride 0.9% 250 mL IVPB       See Hyperspace for full Linked Orders Report.   200 mg 580 mL/hr over 30 Minutes Intravenous Once 04/15/22 1244 04/15/22 1526   04/15/22 1330  cefTRIAXone (ROCEPHIN) 2 g in sodium chloride 0.9 % 100 mL IVPB  Status:  Discontinued        2 g 200 mL/hr over 30 Minutes Intravenous Every 24 hours 04/15/22 1244 04/22/22 1219   04/14/22 1445  cefTRIAXone (ROCEPHIN) 1 g in sodium chloride 0.9 % 100 mL IVPB        1 g 200 mL/hr over 30 Minutes  Intravenous  Once 04/14/22 1438 04/14/22 1540        I have personally reviewed the following labs and images: CBC: Recent Labs  Lab 04/25/22 1455 04/26/22 0113 04/27/22 1252 04/28/22 2210 04/30/22 1009  WBC 7.9 6.6 7.9 6.6 8.1  NEUTROABS 5.7 5.2 6.8  --   --   HGB 9.5* 10.2* 9.9* 10.8* 10.1*  HCT 27.9* 30.0* 28.7* 30.7* 29.8*  MCV 96.2 96.2 96.3 94.2 96.1  PLT 79* 99* 106* 117* 111*   BMP &GFR Recent Labs  Lab 04/24/22 0332 04/26/22 0113 04/27/22 1252 04/28/22 2210 04/30/22 1009  NA 134* 136 134* 133* 133*  K 4.3 4.2 4.8 4.3 4.3  CL 95* 95* 95* 93* 95*  CO2 '27 28 25 25 23  '$ GLUCOSE 126* 128* 154* 203* 185*  BUN 35* 40* 77* 52* 91*  CREATININE 5.06* 4.36* 6.13* 4.27* 6.51*  CALCIUM 8.7* 8.9 9.0 8.4* 8.8*  MG 2.0 1.9 2.2  --   --   PHOS 2.7 3.0 4.5 3.4 4.6   Estimated Creatinine Clearance: 14.3 mL/min (A) (by C-G formula based on SCr of 6.51 mg/dL (H)). Liver & Pancreas: Recent Labs  Lab 04/26/22 0113 04/27/22 1252 04/28/22 2210 04/30/22 1009  ALBUMIN 1.9* 1.9* 1.9* 1.9*   No results for input(s): "LIPASE", "AMYLASE" in the last 168 hours. No results for input(s): "AMMONIA" in the last 168 hours. Diabetic: No results for input(s): "HGBA1C" in the last 72 hours. Recent Labs  Lab 04/29/22 0623 04/29/22 1135 04/29/22 2028 04/30/22 0000 04/30/22 0635  GLUCAP 100* 109* 174* 129* 97   Cardiac Enzymes: No results for input(s): "CKTOTAL", "CKMB", "CKMBINDEX", "TROPONINI" in the last 168 hours. No results for input(s): "PROBNP" in the last 8760 hours. Coagulation Profile: No results for input(s): "INR", "PROTIME" in the last 168 hours. Thyroid Function Tests: No results for input(s): "TSH", "T4TOTAL", "FREET4", "T3FREE", "THYROIDAB" in the last 72 hours. Lipid Profile: No results for input(s): "CHOL", "HDL", "LDLCALC", "TRIG", "CHOLHDL", "LDLDIRECT" in the last 72 hours. Anemia Panel: No results for input(s): "VITAMINB12", "FOLATE", "FERRITIN", "TIBC",  "IRON", "RETICCTPCT" in the last 72 hours. Urine analysis:    Component Value Date/Time   COLORURINE YELLOW 12/09/2021 0900   APPEARANCEUR CLEAR 12/09/2021 0900   LABSPEC 1.008 12/09/2021 0900   PHURINE 5.0 12/09/2021 0900   GLUCOSEU NEGATIVE 12/09/2021 0900   HGBUR MODERATE (A) 12/09/2021 0900   BILIRUBINUR NEGATIVE 12/09/2021 0900   KETONESUR NEGATIVE 12/09/2021 0900   PROTEINUR 100 (A) 12/09/2021 0900   UROBILINOGEN 0.2 12/31/2009 0746   NITRITE NEGATIVE 12/09/2021 0900   LEUKOCYTESUR NEGATIVE 12/09/2021 0900   Sepsis Labs: Invalid input(s): "PROCALCITONIN", "LACTICIDVEN"  Microbiology: No results found for this or any previous visit (  from the past 240 hour(s)).  Radiology Studies: No results found.    Sherlyn Ebbert T. [place]  If 7PM-7AM, please contact night-coverage [email or web address] 04/30/2022, 1:26 PM

## 2022-05-01 DIAGNOSIS — D62 Acute posthemorrhagic anemia: Secondary | ICD-10-CM | POA: Diagnosis not present

## 2022-05-01 DIAGNOSIS — K920 Hematemesis: Secondary | ICD-10-CM | POA: Diagnosis not present

## 2022-05-01 DIAGNOSIS — E118 Type 2 diabetes mellitus with unspecified complications: Secondary | ICD-10-CM | POA: Diagnosis not present

## 2022-05-01 DIAGNOSIS — G9340 Encephalopathy, unspecified: Secondary | ICD-10-CM | POA: Diagnosis not present

## 2022-05-01 MED ORDER — CHLORHEXIDINE GLUCONATE CLOTH 2 % EX PADS: 6.0000 | MEDICATED_PAD | Freq: Every day | CUTANEOUS | Status: DC

## 2022-05-01 NOTE — NC FL2 (Signed)
[place] LEVEL OF CARE FORM     IDENTIFICATION  Patient Name: Daniel [name] Birthdate: 27-Feb-1961 Sex: male Admission Date (Current Location): 04/14/2022  [facility] [place] Number:  Herbalist and Address:  [facility], [place] [address], [place], [place] [address]      Provider Number: [id number]  Attending Physician Name and Address:  Mercy Riding, MD  Relative Name and Phone Number:       Current Level of Care: Hospital Recommended Level of Care: [place] Prior Approval Number:    Date Approved/Denied:   PASRR Number: [id number]  Discharge Plan: SNF    Current Diagnoses: Patient Active Problem List   Diagnosis Date Noted   Acute encephalopathy 04/23/2022   Protein-calorie malnutrition, severe 04/18/2022   Acute on chronic respiratory failure ([place]) 04/17/2022   Pneumonia due to COVID-19 virus 04/15/2022   Symptomatic anemia 04/14/2022   GIB (gastrointestinal bleeding) 04/14/2022   Fluid overload 04/06/2022   Acute respiratory failure with hypoxia ([place]) 03/24/2022   Hypervolemia associated with renal insufficiency 03/23/2022   Decompensated HCV cirrhosis ([place])    Other ascites    Cirrhosis of liver with ascites ([place])    Acute cardiogenic pulmonary edema ([place]) 01/02/2022   Bilateral pleural effusion 01/02/2022   Volume overload 12/25/2021   Acute on chronic anemia    Hyponatremia 12/09/2021   Mixed hyperlipidemia 11/13/2021   Musculoskeletal neck pain 11/13/2021   ABLA (acute blood loss anemia) 11/12/2021   Iron deficiency anemia due to chronic blood loss 10/17/2021   Euthyroid sick syndrome 10/10/2021   Elevated troponin 10/10/2021   LLQ abdominal pain 10/08/2021   Physical deconditioning 10/08/2021   Cognitive decline 10/08/2021   Left flank pain 10/07/2021   Intestinal polyps 10/06/2021   Portal hypertensive gastropathy ([place]) 10/06/2021   Rotator cuff syndrome 10/05/2021    Epistaxis 10/05/2021   Hx of BKA, left ([place]) 10/05/2021   Edema of amputation stump of left lower extremity ([place]) 09/28/2021   Chronic pain disorder 09/28/2021   Amputation stump infection ([place])    Sepsis ([place]) 08/16/2021   Right foot pain 08/07/2021   Right lower lobe lung mass 08/06/2021   Hepatocellular carcinoma ([place]) 08/06/2021   Pressure injury of skin 07/05/2021   Right leg pain 07/04/2021   Chronic GI bleeding 07/04/2021   Acute on chronic diastolic CHF (congestive heart failure) ([place]) 06/14/2021   GERD without esophagitis 06/14/2021   Chest pain 06/13/2021   Diabetic foot infection ([place]) 04/14/2021   PAD (peripheral artery disease) ([place]) 04/08/2021   Gastritis and gastroduodenitis    ESRD (end stage renal disease) ([place]) 03/25/2021   Diabetes mellitus type 2, controlled, with complications ([place]) 56/38/9373   Alcohol abuse with intoxication ([place]) 03/25/2021   Cocaine abuse ([place]) 03/25/2021   Tobacco use disorder 03/25/2021   Class 2 obesity due to excess calories with body mass index (BMI) of 39.0 to 39.9 in adult 03/25/2021   AVM (arteriovenous malformation) of duodenum, acquired    Peptic ulcer disease    Upper GI bleed 03/24/2021   Chalazion of right upper eyelid 03/24/2021   Thrombocytopenia ([place]) 03/24/2021   Visual hallucination 03/24/2021   Bilateral leg edema 03/24/2021   Benign neoplasm of duodenum, jejunum, and ileum    Anemia due to chronic kidney disease 02/23/2021   Rotator cuff tear arthropathy of right shoulder 01/23/2021   Alcohol dependence ([place]) 01/13/2021   Gastric ulcer with hemorrhage 01/13/2021   Generalized weakness  Transaminitis 01/10/2021   Alcohol abuse 10/27/2020   History of cocaine abuse ([place]) 10/27/2020   Acute upper GI bleed 08/10/2020   Duodenal ulcer with hemorrhage    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   AVM (arteriovenous malformation) of small bowel, acquired    Anemia due to GI blood loss 07/31/2019    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) ([place]) 07/11/2019   Iron deficiency anemia 03/30/2019   Alcohol use disorder 03/30/2019   B12 deficiency 03/30/2019   Orthostatic hypotension 01/22/2019   Essential hypertension 06/29/2016    Orientation RESPIRATION BLADDER Height & Weight     Self  Normal Incontinent, External catheter Weight: 213 lb 13.5 oz (97 kg) Height:  '5\' 11"'$  (180.3 cm)  BEHAVIORAL SYMPTOMS/MOOD NEUROLOGICAL BOWEL NUTRITION STATUS      Incontinent Diet (See dc summary)  AMBULATORY STATUS COMMUNICATION OF NEEDS Skin   Extensive Assist Verbally PU Stage and Appropriate Care (Stage II on buttocks w/foam dressing)                       Personal Care Assistance Level of Assistance  Bathing, Feeding, Dressing Bathing Assistance: Maximum assistance Feeding assistance: Limited assistance Dressing Assistance: Limited assistance     Functional Limitations Info             SPECIAL CARE FACTORS FREQUENCY  PT (By licensed PT), OT (By licensed OT)     PT Frequency: 5x/week OT Frequency: 5x/week            Contractures Contractures Info: Not present    Additional Factors Info  Code Status, Allergies Code Status Info: DNR Allergies Info: Seroquel (Quetiapine), Dilaudid (Hydromorphone Hcl)           Current Medications (05/01/2022):  This is the current hospital active medication list Current Facility-Administered Medications  Medication Dose Route Frequency Provider Last Rate Last Admin   acetaminophen (TYLENOL) tablet 650 mg  650 mg Oral Q6H PRN Erick Colace, NP       Or   acetaminophen (TYLENOL) suppository 650 mg  650 mg Rectal Q6H PRN Erick Colace, NP       albuterol (PROVENTIL) (2.5 MG/3ML) 0.083% nebulizer solution 2.5 mg  2.5 mg Nebulization Q4H PRN Erick Colace, NP   2.5 mg at 04/25/22 0830   alteplase (CATHFLO ACTIVASE) injection 2 mg  2 mg Intracatheter Once PRN Erick Colace, NP       amLODipine (NORVASC) tablet  10 mg  10 mg Oral Daily Wendee Beavers T, MD   10 mg at 05/01/22 0950   anticoagulant sodium citrate solution 5 mL  5 mL Intracatheter PRN Erick Colace, NP       arformoterol [facility]) nebulizer solution 15 mcg  15 mcg Nebulization BID Erick Colace, NP   15 mcg at 05/01/22 0740   budesonide (PULMICORT) nebulizer solution 0.25 mg  0.25 mg Nebulization BID Dana Allan I, MD   0.25 mg at 05/01/22 0740   calcium carbonate (dosed in mg elemental calcium) suspension 500 mg of elemental calcium  500 mg of elemental calcium Oral BID Reome, Earle J, RPH   500 mg of elemental calcium at 05/01/22 0950   Chlorhexidine Gluconate Cloth 2 % PADS 6 each  6 each Topical Q0600 Corliss Parish, MD   6 each at 05/01/22 0635   docusate sodium (COLACE) capsule 100 mg  100 mg Oral BID PRN Erick Colace, NP  folic acid (FOLVITE) tablet 1 mg  1 mg Oral Daily Reome, Earle J, RPH   1 mg at 05/01/22 0950   guaiFENesin-dextromethorphan (ROBITUSSIN DM) 100-10 MG/5ML syrup 5 mL  5 mL Oral Q4H PRN Reome, Earle J, RPH       haloperidol lactate (HALDOL) injection 2 mg  2 mg Intravenous Q6H PRN Wendee Beavers T, MD   2 mg at 04/30/22 2226   heparin injection 1,000 Units  1,000 Units Intracatheter PRN Erick Colace, NP   2,400 Units at 04/15/22 0811   heparin injection 5,000 Units  5,000 Units Subcutaneous Q8H Erick Colace, NP   5,000 Units at 05/01/22 1524   hydrALAZINE (APRESOLINE) tablet 25 mg  25 mg Oral Q6H PRN Mercy Riding, MD       lactulose (CHRONULAC) 10 GM/15ML solution 10 g  10 g Oral QID Reome, Earle J, RPH   10 g at 05/01/22 1524   lidocaine (PF) (XYLOCAINE) 1 % injection 5 mL  5 mL Intradermal PRN Erick Colace, NP       lidocaine-prilocaine (EMLA) cream 1 Application  1 Application Topical PRN Erick Colace, NP       multivitamin (RENA-VIT) tablet 1 tablet  1 tablet Oral QHS Reome, Earle J, RPH   1 tablet at 04/30/22 2105   ondansetron (ZOFRAN) tablet 4 mg  4 mg Oral Q6H PRN Erick Colace, NP       Or   ondansetron (ZOFRAN) injection 4 mg  4 mg Intravenous Q6H PRN Erick Colace, NP       Oral care mouth rinse  15 mL Mouth Rinse 4 times per day Erick Colace, NP   15 mL at 05/01/22 1230   Oral care mouth rinse  15 mL Mouth Rinse PRN Erick Colace, NP   15 mL at 04/21/22 0020   pantoprazole (PROTONIX) EC tablet 40 mg  40 mg Oral BID AC Reome, Earle J, RPH   40 mg at 05/01/22 0950   pentafluoroprop-tetrafluoroeth (GEBAUERS) aerosol 1 Application  1 Application Topical PRN Erick Colace, NP       polyethylene glycol (MIRALAX / GLYCOLAX) packet 17 g  17 g Oral Daily PRN Erick Colace, NP       revefenacin (YUPELRI) nebulizer solution 175 mcg  175 mcg Nebulization Daily Erick Colace, NP   175 mcg at 05/01/22 0740     Discharge Medications: Please see discharge summary for a list of discharge medications.  Relevant Imaging Results:  Relevant Lab Results:   Additional Information SSN# [id number]. Requires transport to [place] on MWF. Pt has an 11:10 chair time  Benard Halsted, LCSW

## 2022-05-01 NOTE — Progress Notes (Signed)
PROGRESS NOTE  Daniel [name] VPX:[id number] DOB: 1960/08/07   PCP: [name], NP  Patient is from: Home.  DOA: 04/14/2022 LOS: 93  Chief complaints Chief Complaint  Patient presents with   Generalized Body Aches   Cough     Brief Narrative / Interim history: 62 year old M with PMH of PAD s/p left BKA, ESRD on HD MWF, DM-2, EtOH cirrhosis, HCC, GI bleed, duodenal ulcer and AVM returning with hematemesis on 12/18 after he left AMA on 12/17.  Tested positive for COVID on 12/17 after he presented with generalized body symptoms and left AMA.  He returned on 12/18 with hematemesis and intubated.  He self extubated on 12/21.  CODE STATUS changed to DNR/DNI on 12/24.  He was transfused 2 units of blood on 04/24/2022.  Eventually, he was stabilized and transferred to hospitalist service.   Hospital course complicated by delirium.  Respiratory failure resolved.  Nephrology and palliative medicine following.  Therapy recommended SNF.  Medically optimized for discharge  Subjective: Seen and examined earlier this morning.  No major events overnight of this morning.  No complaints but not a great historian.  He is oriented to self and place but not time.  No insight into why he is in the hospital  Objective: Vitals:   05/01/22 0500 05/01/22 0741 05/01/22 0837 05/01/22 1201  BP:   127/78 111/67  Pulse:   90 98  Resp:   18 18  Temp:   98 F (36.7 C) 98 F (36.7 C)  TempSrc:   Oral Oral  SpO2:  96% 93% 92%  Weight: 97 kg     Height:        Examination:  GENERAL: No apparent distress.  Nontoxic. HEENT: MMM.  Vision and hearing grossly intact.  NECK: Supple.  No apparent JVD.  RESP:  No IWOB.  Fair aeration bilaterally. CVS:  RRR. Heart sounds normal.  ABD/GI/GU: BS+. Abd soft, NTND.  MSK/EXT:  Moves extremities.  Left BKA. SKIN: no apparent skin lesion or wound NEURO: Awake and alert. Oriented to self, place.  Follows commands.  No insight into why he is in the  hospital.  No apparent focal neuro deficit. PSYCH: Calm. Normal affect.   Procedures:  ETT from 12/18-12/21  Microbiology summarized: 12/19-MRSA PCR screen and respiratory cultures negative.  Assessment and plan: Principal Problem:   GIB (gastrointestinal bleeding) Active Problems:   ABLA (acute blood loss anemia)   Alcohol abuse   ESRD (end stage renal disease) (HCC)   Acute encephalopathy   Thrombocytopenia (HCC)   Peptic ulcer disease   Diabetes mellitus type 2, controlled, with complications ([facility])   Physical deconditioning   Iron deficiency anemia due to chronic blood loss   Acute on chronic anemia   Cirrhosis of liver with ascites (HCC)   Symptomatic anemia   Pneumonia due to COVID-19 virus   Acute on chronic respiratory failure (HCC)   Protein-calorie malnutrition, severe   Acute blood loss anemia due to upper GI bleed/hematemesis: Has EtOH cirrhosis, duodenal ulcer, AVM, portal gastropathy and G1 EV-H&H stable since she received 2 units on 12/28. Recent Labs    04/20/22 0632 04/21/22 0658 04/22/22 0620 04/23/22 0520 04/24/22 0332 04/25/22 1455 04/26/22 0113 04/27/22 1252 04/28/22 2210 04/30/22 1009  HGB 7.4* 7.3* 7.1* 6.7* 8.6* 9.5* 10.2* 9.9* 10.8* 10.1*  -Continue PPI -Encouraged alcohol cessation.  Acute respiratory failure with hypoxia in the setting of COVID-19 infection, pneumonia and pulmonary edema: Intubated on 12/18 and self extubated on 12/21.  Currently on room air, DNR and DNI. -Completed 7 days of ceftriaxone.  Briefly received vancomycin as well. -Completed steroid course for COVID-19. -Fluid management by HD per nephrology -Continue breathing treatments   ESRD on iHD MWF, poorly compliant as an outpatient Hyponatremia, hyperkalemia, hyperphosphatemia -Nephrology managing.   Thrombocytopenia-chronic due to cirrhosis: Improving. Recent Labs  Lab 04/25/22 1455 04/26/22 0113 04/27/22 1252 04/28/22 2210 04/30/22 1009  PLT 79* 99* 106*  117* 111*  -Monitor intermittently  History of EtOH cirrhosis/HCC -Outpatient follow-up.   Uncontrolled hypertension: BP elevated. -Increased amlodipine to 10 mg daily. -Change as needed hydralazine to p.o. -Fluid management by HD.   Acute metabolic encephalopathy/delirium: Should be outside withdrawal window for alcohol by now.  Oriented to self and place but limited insight.  Improving. -Reorientation and delirium precaution. -IV Haldol 2 mg every 6 hours as needed.  No prolonged QT on EKG. -Continue weaning of Librium taper -Optimize electrolytes.  Controlled NIDDM-2: A1c 5.1%.  CBG fairly controlled. -Discontinued CBG monitoring and SSI insulin  Physical deconditioning/left BKA: Patient lives alone prior to hospitalization.  He has a left BKA and physically deconditioned.  He is only oriented to self and place.  No insight into why he is in the hospital.  Well-known history of noncompliance.  Cannot be home safely by himself.  Therapy recommended SNF.   Goal of care: DNR/DNI. -Palliative following.  Severe protein calorie malnutrition in the setting of alcohol abuse and chronic disease Body mass index is 29.83 kg/m. Nutrition Problem: Severe Malnutrition Etiology: chronic illness (alcoholic cirrhosis, hepatocellular carcinoma, ESRD) Signs/Symptoms: severe fat depletion, severe muscle depletion Interventions: MVI, Tube feeding    DVT prophylaxis:  heparin injection 5,000 Units Start: 04/19/22 2200 SCDs Start: 04/14/22 1640  Code Status: DNR/DNI Family Communication: Attempted to call patient's sister for update but no answer. Level of care: Med-Surg Status is: Inpatient Remains inpatient appropriate because: SNF bed.     Final disposition: SNF Consultants:  Pulmonology admitted patient Nephrology Palliative medicine  Sch Meds:  Scheduled Meds:  amLODipine  10 mg Oral Daily   arformoterol  15 mcg Nebulization BID   budesonide (PULMICORT) nebulizer solution   0.25 mg Nebulization BID   calcium carbonate (dosed in mg elemental calcium)  500 mg of elemental calcium Oral BID   Chlorhexidine Gluconate Cloth  6 each Topical [id number]   folic acid  1 mg Oral Daily   heparin injection (subcutaneous)  5,000 Units Subcutaneous Q8H   lactulose  10 g Oral QID   multivitamin  1 tablet Oral QHS   mouth rinse  15 mL Mouth Rinse 4 times per day   pantoprazole  40 mg Oral BID AC   revefenacin  175 mcg Nebulization Daily   Continuous Infusions:  anticoagulant sodium citrate     PRN Meds:.acetaminophen **OR** acetaminophen, albuterol, alteplase, anticoagulant sodium citrate, docusate sodium, guaiFENesin-dextromethorphan, haloperidol lactate, heparin, hydrALAZINE, lidocaine (PF), lidocaine-prilocaine, ondansetron **OR** ondansetron (ZOFRAN) IV, mouth rinse, pentafluoroprop-tetrafluoroeth, polyethylene glycol  Antimicrobials: Anti-infectives (From admission, onward)    Start     Dose/Rate Route Frequency Ordered Stop   04/18/22 1200  vancomycin (VANCOCIN) IVPB 1000 mg/200 mL premix  Status:  Discontinued        1,000 mg 200 mL/hr over 60 Minutes Intravenous Every M-W-F (Hemodialysis) 04/16/22 1428 04/17/22 0727   04/16/22 1000  remdesivir 100 mg in sodium chloride 0.9 % 100 mL IVPB       See Hyperspace for full Linked Orders Report.   100 mg 200 mL/hr over  30 Minutes Intravenous Daily 04/15/22 1244 04/17/22 1133   04/15/22 1445  vancomycin (VANCOREADY) IVPB 2000 mg/400 mL        2,000 mg 200 mL/hr over 120 Minutes Intravenous  Once 04/15/22 1354 04/15/22 1823   04/15/22 1354  vancomycin variable dose per unstable renal function (pharmacist dosing)  Status:  Discontinued         Does not apply See admin instructions 04/15/22 1354 04/17/22 1206   04/15/22 1330  remdesivir 200 mg in sodium chloride 0.9% 250 mL IVPB       See Hyperspace for full Linked Orders Report.   200 mg 580 mL/hr over 30 Minutes Intravenous Once 04/15/22 1244 04/15/22 1526   04/15/22 1330   cefTRIAXone (ROCEPHIN) 2 g in sodium chloride 0.9 % 100 mL IVPB  Status:  Discontinued        2 g 200 mL/hr over 30 Minutes Intravenous Every 24 hours 04/15/22 1244 04/22/22 1219   04/14/22 1445  cefTRIAXone (ROCEPHIN) 1 g in sodium chloride 0.9 % 100 mL IVPB        1 g 200 mL/hr over 30 Minutes Intravenous  Once 04/14/22 1438 04/14/22 1540        I have personally reviewed the following labs and images: CBC: Recent Labs  Lab 04/25/22 1455 04/26/22 0113 04/27/22 1252 04/28/22 2210 04/30/22 1009  WBC 7.9 6.6 7.9 6.6 8.1  NEUTROABS 5.7 5.2 6.8  --   --   HGB 9.5* 10.2* 9.9* 10.8* 10.1*  HCT 27.9* 30.0* 28.7* 30.7* 29.8*  MCV 96.2 96.2 96.3 94.2 96.1  PLT 79* 99* 106* 117* 111*   BMP &GFR Recent Labs  Lab 04/26/22 0113 04/27/22 1252 04/28/22 2210 04/30/22 1009  NA 136 134* 133* 133*  K 4.2 4.8 4.3 4.3  CL 95* 95* 93* 95*  CO2 '28 25 25 23  '$ GLUCOSE 128* 154* 203* 185*  BUN 40* 77* 52* 91*  CREATININE 4.36* 6.13* 4.27* 6.51*  CALCIUM 8.9 9.0 8.4* 8.8*  MG 1.9 2.2  --   --   PHOS 3.0 4.5 3.4 4.6   Estimated Creatinine Clearance: 14 mL/min (A) (by C-G formula based on SCr of 6.51 mg/dL (H)). Liver & Pancreas: Recent Labs  Lab 04/26/22 0113 04/27/22 1252 04/28/22 2210 04/30/22 1009  ALBUMIN 1.9* 1.9* 1.9* 1.9*   No results for input(s): "LIPASE", "AMYLASE" in the last 168 hours. No results for input(s): "AMMONIA" in the last 168 hours. Diabetic: No results for input(s): "HGBA1C" in the last 72 hours. Recent Labs  Lab 04/29/22 0623 04/29/22 1135 04/29/22 2028 04/30/22 0000 04/30/22 0635  GLUCAP 100* 109* 174* 129* 97   Cardiac Enzymes: No results for input(s): "CKTOTAL", "CKMB", "CKMBINDEX", "TROPONINI" in the last 168 hours. No results for input(s): "PROBNP" in the last 8760 hours. Coagulation Profile: No results for input(s): "INR", "PROTIME" in the last 168 hours. Thyroid Function Tests: No results for input(s): "TSH", "T4TOTAL", "FREET4", "T3FREE",  "THYROIDAB" in the last 72 hours. Lipid Profile: No results for input(s): "CHOL", "HDL", "LDLCALC", "TRIG", "CHOLHDL", "LDLDIRECT" in the last 72 hours. Anemia Panel: No results for input(s): "VITAMINB12", "FOLATE", "FERRITIN", "TIBC", "IRON", "RETICCTPCT" in the last 72 hours. Urine analysis:    Component Value Date/Time   COLORURINE YELLOW 12/09/2021 0900   APPEARANCEUR CLEAR 12/09/2021 0900   LABSPEC 1.008 12/09/2021 0900   PHURINE 5.0 12/09/2021 0900   GLUCOSEU NEGATIVE 12/09/2021 0900   HGBUR MODERATE (A) 12/09/2021 0900   BILIRUBINUR NEGATIVE 12/09/2021 0900   KETONESUR NEGATIVE 12/09/2021  0900   PROTEINUR 100 (A) 12/09/2021 0900   UROBILINOGEN 0.2 12/31/2009 0746   NITRITE NEGATIVE 12/09/2021 0900   LEUKOCYTESUR NEGATIVE 12/09/2021 0900   Sepsis Labs: Invalid input(s): "PROCALCITONIN", "LACTICIDVEN"  Microbiology: No results found for this or any previous visit (from the past 240 hour(s)).  Radiology Studies: No results found.    Kalyse Meharg T. [place]  If 7PM-7AM, please contact night-coverage [email or web address] 05/01/2022, 1:59 PM

## 2022-05-01 NOTE — Progress Notes (Signed)
[facility] Progress Note   Subjective:    Last HD on 1/3 with 1.8 kg UF.  Still looking for SNF per primary team note; CM has attempted to reach his sister.  He provides limited history but yesterday stated brother and sister help to make decisions.       Review of systems: Denies shortness of breath  Denies n/v Limited by ams   Objective Vitals:   05/01/22 0001 05/01/22 0402 05/01/22 0500 05/01/22 0741  BP: 117/69 127/79    Pulse: 88 88    Resp: 16 20    Temp: 97.6 F (36.4 C) 97.7 F (36.5 C)    TempSrc: Axillary Oral    SpO2: 97% 96%  96%  Weight:   97 kg   Height:       Physical Exam    General adult male in bed in no acute distress HEENT normocephalic atraumatic extraocular movements intact sclera anicteric Neck supple trachea midline Lungs clear to auscultation bilaterally normal work of breathing at rest on room air  Heart S1S2 no rub Abdomen soft nontender distended Extremities no edema; left BKA  Psych no anxiety or agitation  Neuro alert and oriented to person and location of hospital - states year is 2000 Access LUE AVF bruit and thrill    Additional Objective Labs: Basic Metabolic Panel: Recent Labs  Lab 04/27/22 1252 04/28/22 2210 04/30/22 1009  NA 134* 133* 133*  K 4.8 4.3 4.3  CL 95* 93* 95*  CO2 '25 25 23  '$ GLUCOSE 154* 203* 185*  BUN 77* 52* 91*  CREATININE 6.13* 4.27* 6.51*  CALCIUM 9.0 8.4* 8.8*  PHOS 4.5 3.4 4.6   Liver Function Tests: Recent Labs  Lab 04/27/22 1252 04/28/22 2210 04/30/22 1009  ALBUMIN 1.9* 1.9* 1.9*   No results for input(s): "LIPASE", "AMYLASE" in the last 168 hours. CBC: Recent Labs  Lab 04/25/22 1455 04/26/22 0113 04/27/22 1252 04/28/22 2210 04/30/22 1009  WBC 7.9 6.6 7.9 6.6 8.1  NEUTROABS 5.7 5.2 6.8  --   --   HGB 9.5* 10.2* 9.9* 10.8* 10.1*  HCT 27.9* 30.0* 28.7* 30.7* 29.8*  MCV 96.2 96.2 96.3 94.2 96.1  PLT 79* 99* 106* 117* 111*   Blood Culture    Component Value  Date/Time   SDES TRACHEAL ASPIRATE 04/15/2022 1622   SPECREQUEST NONE 04/15/2022 1622   CULT  04/15/2022 1622    RARE Normal respiratory flora-no Staph aureus or Pseudomonas seen Performed at [facility] [address], [place], [place] [address]    REPTSTATUS 04/18/2022 FINAL 04/15/2022 1622    Cardiac Enzymes: No results for input(s): "CKTOTAL", "CKMB", "CKMBINDEX", "TROPONINI" in the last 168 hours. CBG: Recent Labs  Lab 04/29/22 0623 04/29/22 1135 04/29/22 2028 04/30/22 0000 04/30/22 0635  GLUCAP 100* 109* 174* 129* 97   Iron Studies: No results for input(s): "IRON", "TIBC", "TRANSFERRIN", "FERRITIN" in the last 72 hours. '@lablastinr3'$ @ Studies/Results: No results found. Medications:  anticoagulant sodium citrate      amLODipine  10 mg Oral Daily   arformoterol  15 mcg Nebulization BID   budesonide (PULMICORT) nebulizer solution  0.25 mg Nebulization BID   calcium carbonate (dosed in mg elemental calcium)  500 mg of elemental calcium Oral BID   Chlorhexidine Gluconate Cloth  6 each Topical Q4696   folic acid  1 mg Oral Daily   heparin injection (subcutaneous)  5,000 Units Subcutaneous Q8H   lactulose  10 g Oral QID   multivitamin  1 tablet  Oral QHS   mouth rinse  15 mL Mouth Rinse 4 times per day   pantoprazole  40 mg Oral BID AC   revefenacin  175 mcg Nebulization Daily    OP Dialysis Orders:  MWF [facility] 4h  400/A1.5    98.8kg   2K/2Ca bat   LUE AVF  Heparin none - Hectoral 57mg IV q HD - Appears to get IV iron with oncology, no ESA d/t cance  Assessment/Plan:  Acute hypoxic respiratory failure - following large volume emesis of blood and was intubated in the ED.  Self-extubated 04/17/22, on HFNC which has been titrated down.  Per primary team. We are optimizing volume with HD ABLA - acute on chronic with evidence of GI bleed.  Known AVM's.  GI consulted. s/p octreotide and on PPI BID.  Transfuse prn. Hb improved.   Covid-19 Positive -  Quarantined for 5  days per protocol.  per pmd.  Per charting he tested positive for covid on 12/17  ESRD - Continue HD per MWF schedule - using AVF.   Renal panel in AM  Hypertension/volume  - CXR 12/23 diffuse bilat infiltrates. Not sure vol vs aspiration pna vs other.  optimize UF with HD   Anemia  - Multiple transfusions this admission, last Hgb 10.8. Not on ESA due to hepatic cancer.   Metabolic bone disease -  Corrected calcium elevated thus VDRA was previously placed on hold - CCa still up on last check. Phosphorus is at goal. Changed calcium carbonate from TID to BID   Medical noncompliance - multiple admissions - ongoing issue  Hepatocellular carcinoma - s/p Y90 on 11/28/21.  Disposition - per primary team.  Per charting he is inpatient due to the need for SNF placement.     LClaudia Desanctis MD 05/01/2022, 8:24 AM [facility]

## 2022-05-01 NOTE — TOC Progression Note (Signed)
Transition of Care [facility]) - Progression Note    Patient Details  Name: Daniel [name] MRN: [id number] Date of Birth: [birth year]  Transition of Care [facility]) CM/SW [place], LCSW Phone Number: 05/01/2022, 5:08 PM  Clinical Narrative:    CSW received consult for possible SNF placement at time of discharge. CSW spoke with patient's sister [place]. She stated that she works from 2am-2pm and requested if we need to reach her to try at 3pm or after. She reported that patient lives in a home with people and he is currently unable to care for himself. She expressed understanding of PT recommendation and is agreeable to SNF placement at time of discharge. CSW discussed insurance authorization process and will provide Medicare SNF ratings list. CSW will send out referrals for review and provide bed offers as available. Barrier could include need for dialysis transport. Verdene Lennert stated patient had been to [facility] before for about 60 days and then left because at that point he would have needed to sign over his monthly check which he did not want to do. She stated if [facility] is the only one that offers then they would accept it. She is in touch with the owner of the house to work out payment while he is in rehab.   Skilled Nursing Rehab Facilities-   [email or web address]   Ratings out of 5 stars (5 the highest)   Name Address  Phone # Foreman Inspection Overall  [facility] [address], [place] '4 5 2 3  '$ [facility]  [address] [place], [place] [phone] '4 2 5 5  '$ [facility] [place], [place] '1 3 1 1  '$ [place], [place] '2 2 4 4  '$ [facility] [address], [place] '2 1 2 1  '$ [place] [address], [place] [phone] '3 3 4 4  '$ [facility] [address] [place] '4 1 3 2  '$ [facility] [place] [address] [place] '4 1 3 2  '$ [address] ([place]) [place], [place] [phone] '3 1 2 1  '$ [facility] [phone], [place] [phone] '3 1 1 1  '$ [facility] [address] [facility] [phone] '3 2 2 2  '$ [facility]) [place]. [place], [place] [phone] '3 1 1 1  '$ [facility]in Flock [address] [place] [place] [phone] '4 2 4 4          '$ [place] '4 1 3 2  '$ [facility]  [address] [place] '3 1 5 4  '$ [address] [place], [place] [phone] '1 1 2 1  '$ [facility] [address], [employer] [phone] '2 2 4 4          '$ [address] (no [facility]) [place] [address], [place] [phone] '5 5 5 5  '$ [facility] (No Humana) [address], [place] '4 1 4 3  '$ [facility] (No UHC) [place], [place] [phone] '5 5 5 5  '$ [facility] [address], [employer] [phone] '2 3 5 5  '$ [facility] [place] [address] [place] '1 1 2 1  '$ [facility] [address], [place] [phone] '3 1 1 1  '$ [facility] [place], [place]  $'5 2 5 5  'C$ [facility]  [address], [place] '2 2 1 1  '$ [facility] [address] [place] '3 2 1 1  '$ [facility] [place], [place] '2 2 2 2          '$ [facility] [address] [place] [phone] '1 1 1 1  '$ [facility] [address] [place]  [phone] '2 4 3 3  '$ [facility] [place] [address], [place] [phone] '3 2 3 3  '$ [place], [place] '2 1 1 1  '$ [place] (No Humana) [address] [place] [phone] '2 2 3 3  '$ [facility]) [address], [place] [phone] '2 1 1 1          '$ [facility] [place], [place] '5 4 5 5  '$ [facility])  [address], [place] '2 1 2 1  '$ [facility]) [place] [address] [place] '3 1 4 3  '$ [place] [address], [place] '3 3 4 4  '$ [address] [place], [place] '2 3 1 1  '$ [facility]) [address] [place] [phone] '2 1 4 3      '$ Expected Discharge Plan: [place] Barriers to Discharge: Insurance Authorization, SNF Pending bed offer  Expected Discharge Plan and Services In-house Referral: Clinical Social Work Discharge Planning Services: CM Consult Post Acute Care Choice: [place] arrangements for the past 2 months: Single Family Home                 DME Arranged: N/A         HH Arranged: RN, Disease Management, PT, OT HH Agency: [facility] Date [place]: 04/15/22 Time [place]: 0076 Representative spoke with at [place]: Kerry Dory.   Social Determinants of Health ([place]) Interventions SDOH Screenings   Food Insecurity: No Food Insecurity (04/06/2022)  Housing: Low Risk  (04/06/2022)  Recent Concern: Housing - High Risk (03/25/2022)  Transportation Needs: No Transportation Needs (04/06/2022)  Recent Concern: Transportation Needs - Unmet Transportation Needs (03/04/2022)  Utilities: Not At Risk (04/06/2022)  Alcohol Screen: Low Risk  (11/01/2021)  Depression (PHQ2-9): Low Risk  (12/31/2021)  Financial Resource Strain: High Risk (11/01/2021)  Physical Activity: Insufficiently Active (11/01/2021)  Social Connections: Socially Isolated (11/01/2021)  Stress: No Stress Concern Present (11/01/2021)  Tobacco Use: High Risk (04/14/2022)    Readmission Risk Interventions    05/01/2022    5:04 PM 10/10/2021   10:49 AM 02/27/2021   12:51 PM  Readmission Risk  Prevention Plan  Transportation Screening Complete Complete Complete  Medication Review Press photographer) Complete Complete Complete  PCP or Specialist appointment within 3-5 days of discharge Complete Complete Complete  HRI or Home Care Consult Complete Complete Complete  SW Recovery Care/Counseling Consult Complete Complete Complete  Palliative Care Screening Complete Complete Not Applicable  [place] Complete Patient Refused Not Applicable

## 2022-05-01 NOTE — Progress Notes (Signed)
Pt increasingly agitated. Continues to get to edge of bed and tries to stand. Unable to reorient pt. PRN haldol given. Pt assisted back to bed.

## 2022-05-01 NOTE — Progress Notes (Signed)
Occupational Therapy Treatment Patient Details Name: Daniel [name] MRN: [id number] DOB: Sep 10, 1960 Today's Date: 05/01/2022   History of present illness Pt is a 62 y/o male presenting on 04/14/22 with symptomatic anemia. Noted pt missing multiple dialysis sessions due to transportation issues.  Found covid +. Admitted with GIB. ETT 12/18-12/21, acute hypoxic respiratory failure. 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   OT comments  Patient engaged in OT/PT session today with encouragement.  He remains only oriented to self, confused but following some 1 step commands.  He demonstrates poor recall, attention and problem solving as well. Requires up to mod assist to order lunch, setup for grooming, and total assist for toileting/LB dressing.  Requires max assist +2 for partial stand and lateral scoots at EOB.  Continue to recommend SNF at this time.    Recommendations for follow up therapy are one component of a multi-disciplinary discharge planning process, led by the attending physician.  Recommendations may be updated based on patient status, additional functional criteria and insurance authorization.    Follow Up Recommendations  Skilled nursing-short term rehab (<3 hours/day)     Assistance Recommended at Discharge Frequent or constant Supervision/Assistance  Patient can return home with the following  A lot of help with walking and/or transfers;A lot of help with bathing/dressing/bathroom;Assistance with cooking/housework;Direct supervision/assist for medications management;Direct supervision/assist for financial management;Assist for transportation;Help with stairs or ramp for entrance   Equipment Recommendations  Other (comment) (defer)    Recommendations for Other Services      Precautions / Restrictions Precautions Precautions: Fall Precaution Comments: h/o 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 - prosthetic at  home) Restrictions Weight Bearing Restrictions: No       Mobility Bed Mobility Overal bed mobility: Needs Assistance Bed Mobility: Supine to Sit, Sit to Supine     Supine to sit: Min guard Sit to supine: Min assist   General bed mobility comments: HHA to initiate mobility, though pt moving well to EOB without assist; difficulty processing laying down (focused on scooting to chair though pt unable this session), max verbal cues to initiate mobility    Transfers Overall transfer level: Needs assistance Equipment used: 2 person hand held assist Transfers: Sit to/from Stand, Bed to chair/wheelchair/BSC Sit to Stand: Max assist, +2 physical assistance          Lateral/Scoot Transfers: Max assist, +2 physical assistance, +2 safety/equipment General transfer comment: 2x partial standing trials from EOB, pt able to clear buttocks with maxA+2 though poor initiation of BLE or trunk extension; additional maxA+2 to scoot laterally towards HOB; max verbal cues for sequencing, pt able to assist with UEs     Balance Overall balance assessment: Needs assistance Sitting-balance support: No upper extremity supported, Feet supported Sitting balance-Leahy Scale: Fair Sitting balance - Comments: min guard for safety     Standing balance-Leahy Scale: Zero Standing balance comment: unable to achieve fully upright standing                           ADL either performed or assessed with clinical judgement   ADL Overall ADL's : Needs assistance/impaired     Grooming: Set up;Sitting               Lower Body Dressing: Total assistance;Sitting/lateral leans   Toilet Transfer: Maximal assistance;+2 for physical assistance;+2 for safety/equipment Toilet Transfer Details (indicate cue type and reason): simulated lateral scoot on EOB  Functional mobility during ADLs: Maximal assistance;+2 for physical assistance;+2 for safety/equipment      Extremity/Trunk  Assessment              Vision       Perception     Praxis      Cognition Arousal/Alertness: Awake/alert Behavior During Therapy: Flat affect Overall Cognitive Status: No family/caregiver present to determine baseline cognitive functioning Area of Impairment: Orientation, Attention, Memory, Following commands, Safety/judgement, Awareness, Problem solving                 Orientation Level: Disoriented to, Place, Time, Situation Current Attention Level: Sustained Memory: Decreased short-term memory, Decreased recall of precautions Following Commands: Follows one step commands inconsistently, Follows one step commands with increased time Safety/Judgement: Decreased awareness of safety, Decreased awareness of deficits Awareness: Intellectual Problem Solving: Slow processing, Decreased initiation, Difficulty sequencing, Requires verbal cues, Requires tactile cues General Comments: no agitation this session.  pt remains confused, but follows some simple commands given increased time.  Encouragement to participate required.  Poor problem solving, requires mod assist to order lunch. Unable to recall task to get back to bed after multiple cues, perseverates on getting to chair.        Exercises      Shoulder Instructions       General Comments VSS on RA    Pertinent Vitals/ Pain       Pain Assessment Pain Assessment: Faces Faces Pain Scale: Hurts a little bit Pain Location: all over Pain Descriptors / Indicators: Discomfort Pain Intervention(s): Monitored during session, Repositioned  Home Living                                          Prior Functioning/Environment              Frequency  Min 2X/week        Progress Toward Goals  OT Goals(current goals can now be found in the care plan section)  Progress towards OT goals: Progressing toward goals  Acute Rehab OT Goals Patient Stated Goal: none stated OT Goal Formulation: Patient  unable to participate in goal setting Time For Goal Achievement: 05/13/22 Potential to Achieve Goals: [place] Discharge plan remains appropriate;Frequency remains appropriate    Co-evaluation    PT/OT/SLP Co-Evaluation/Treatment: Yes Reason for Co-Treatment: Necessary to address cognition/behavior during functional activity;For patient/therapist safety;To address functional/ADL transfers   OT goals addressed during session: ADL's and self-care;Other (comment) (cog)      AM-PAC OT "6 Clicks" Daily Activity     Outcome Measure   Help from another person eating meals?: A Little Help from another person taking care of personal grooming?: A Little Help from another person toileting, which includes using toliet, bedpan, or urinal?: Total Help from another person bathing (including washing, rinsing, drying)?: A Lot Help from another person to put on and taking off regular upper body clothing?: A Little Help from another person to put on and taking off regular lower body clothing?: Total 6 Click Score: 13    End of Session    OT Visit Diagnosis: Other abnormalities of gait and mobility (R26.89);Other symptoms and signs involving cognitive function   Activity Tolerance Patient tolerated treatment well   Patient Left in bed;with call bell/phone within reach;with bed alarm set   Nurse Communication Mobility status        Time: 6314-9702 OT Time  Calculation (min): 32 min  Charges: OT General Charges $OT Visit: 1 Visit OT Treatments $Self Care/Home Management : 8-22 mins  Daniel [name] Office [phone]   Daniel [name] 05/01/2022, 12:58 PM

## 2022-05-01 NOTE — Progress Notes (Signed)
Unit RN states no longer in need of PIV and will consult IV team if need changes

## 2022-05-01 NOTE — Progress Notes (Signed)
Physical Therapy Treatment Patient Details Name: Daniel [name] MRN: [id number] DOB: 1960-06-09 Today's Date: 05/01/2022   History of Present Illness Pt is a 62 y/o male presenting on 04/14/22 with symptomatic anemia. Noted pt missing multiple dialysis sessions due to transportation issues.  Found covid +. Admitted with GIB. ETT 12/18-12/21, acute hypoxic respiratory failure. 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   PT Comments    Pt progressing with mobility, improved mentation today though still with slowed processing and decreased attention. Today's session focused on standing and transfer trials, pt requiring maxA+2 for partial stands and lateral scooting. Pt remains limited by generalized weakness, decreased activity tolerance, poor balance strategies/postural reactions and impaired cognition. Continue to recommend SNF-level therapies to maximize functional mobility and independence prior to return home.    Recommendations for follow up therapy are one component of a multi-disciplinary discharge planning process, led by the attending physician.  Recommendations may be updated based on patient status, additional functional criteria and insurance authorization.  Follow Up Recommendations  Skilled nursing-short term rehab (<3 hours/day) Can patient physically be transported by private vehicle: No   Assistance Recommended at Discharge Frequent or constant Supervision/Assistance  Patient can return home with the following Two people to help with walking and/or transfers;A lot of help with bathing/dressing/bathroom;Assistance with cooking/housework;Direct supervision/assist for medications management;Direct supervision/assist for financial management;Assist for transportation;Help with stairs or ramp for entrance   Equipment Recommendations   (TBD)    Recommendations for Other Services       Precautions / Restrictions  Precautions Precautions: Fall Precaution Comments: h/o 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 - prosthetic at home) Restrictions Weight Bearing Restrictions: No     Mobility  Bed Mobility Overal bed mobility: Needs Assistance Bed Mobility: Supine to Sit, Sit to Supine     Supine to sit: Min guard Sit to supine: Min assist   General bed mobility comments: HHA to initiate mobility, though pt moving well to EOB without assist; difficulty processing laying down (focused on scooting to chair though pt unable this session), max verbal cues to initiate mobility    Transfers Overall transfer level: Needs assistance Equipment used: 2 person hand held assist Transfers: Sit to/from Stand Sit to Stand: Max assist, +2 physical assistance           General transfer comment: 2x partial standing trials from EOB, pt able to clear buttocks with maxA+2 though poor initiation of BLE or trunk extension; additional maxA+2 to scoot laterally towards HOB; max verbal cues for sequencing, pt able to assist with UEs    Ambulation/Gait                   Stairs             Wheelchair Mobility    Modified Rankin (Stroke Patients Only)       Balance Overall balance assessment: Needs assistance   Sitting balance-Leahy Scale: Fair       Standing balance-Leahy Scale: Zero Standing balance comment: unable to achieve fully upright standing                            Cognition Arousal/Alertness: Awake/alert Behavior During Therapy: Flat affect Overall Cognitive Status: No family/caregiver present to determine baseline cognitive functioning Area of Impairment: Orientation, Attention, Memory, Following commands, Safety/judgement, Awareness                 Orientation Level:  Disoriented to, Place, Time, Situation Current Attention Level: Sustained Memory: Decreased short-term memory, Decreased recall of precautions Following Commands: Follows one step commands inconsistently, Follows  one step commands with increased time Safety/Judgement: Decreased awareness of safety, Decreased awareness of deficits Awareness: Intellectual Problem Solving: Slow processing, Decreased initiation, Difficulty sequencing, Requires verbal cues General Comments: no agitation this session, though still requires encouragement to participate and complete tasks; increased time for all initiation and mobility        Exercises      General Comments        Pertinent Vitals/Pain Pain Assessment Pain Assessment: Faces Faces Pain Scale: Hurts a little bit Pain Location: does not specify Pain Descriptors / Indicators: Discomfort Pain Intervention(s): Monitored during session    Home Living                          Prior Function            PT Goals (current goals can now be found in the care plan section) Acute Rehab PT Goals Patient Stated Goal: get more food Progress towards PT goals: Progressing toward goals    Frequency    Min 3X/week      PT Plan Current plan remains appropriate    Co-evaluation              AM-PAC PT "6 Clicks" Mobility   Outcome Measure  Help needed turning from your back to your side while in a flat bed without using bedrails?: A Little Help needed moving from lying on your back to sitting on the side of a flat bed without using bedrails?: A Little Help needed moving to and from a bed to a chair (including a wheelchair)?: Total Help needed standing up from a chair using your arms (e.g., wheelchair or bedside chair)?: Total Help needed to walk in hospital room?: Total Help needed climbing 3-5 steps with a railing? : Total 6 Click Score: 10    End of Session Equipment Utilized During Treatment: Gait belt Activity Tolerance: Patient tolerated treatment well;Patient limited by fatigue Patient left: in bed;with call bell/phone within reach;with bed alarm set Nurse Communication: Mobility status PT Visit Diagnosis: Other  abnormalities of gait and mobility (R26.89);Muscle weakness (generalized) (M62.81);Difficulty in walking, not elsewhere classified (R26.2)     Time: 4540-9811 PT Time Calculation (min) (ACUTE ONLY): 25 min  Charges:  $Therapeutic Activity: 8-22 mins                     Mabeline Caras, PT, DPT Acute Rehabilitation Services  Personal: [facility] Office: [place] 05/01/2022, 10:49 AM

## 2022-05-02 DIAGNOSIS — E118 Type 2 diabetes mellitus with unspecified complications: Secondary | ICD-10-CM | POA: Diagnosis not present

## 2022-05-02 DIAGNOSIS — D62 Acute posthemorrhagic anemia: Secondary | ICD-10-CM | POA: Diagnosis not present

## 2022-05-02 DIAGNOSIS — G9340 Encephalopathy, unspecified: Secondary | ICD-10-CM | POA: Diagnosis not present

## 2022-05-02 DIAGNOSIS — K920 Hematemesis: Secondary | ICD-10-CM | POA: Diagnosis not present

## 2022-05-02 LAB — RENAL FUNCTION PANEL
Albumin: 1.9 g/dL — ABNORMAL LOW (ref 3.5–5.0)
Anion gap: 12 (ref 5–15)
BUN: 88 mg/dL — ABNORMAL HIGH (ref 8–23)
CO2: 27 mmol/L (ref 22–32)
Calcium: 8.7 mg/dL — ABNORMAL LOW (ref 8.9–10.3)
Chloride: 100 mmol/L (ref 98–111)
Creatinine, Ser: 6.26 mg/dL — ABNORMAL HIGH (ref 0.61–1.24)
GFR, Estimated: 9 mL/min — ABNORMAL LOW (ref >60)
Glucose, Bld: 145 mg/dL — ABNORMAL HIGH (ref 70–99)
Phosphorus: 3 mg/dL (ref 2.5–4.6)
Potassium: 5 mmol/L (ref 3.5–5.1)
Sodium: 139 mmol/L (ref 135–145)

## 2022-05-02 LAB — CBC
HCT: 29.5 % — ABNORMAL LOW (ref 39.0–52.0)
Hemoglobin: 9.8 g/dL — ABNORMAL LOW (ref 13.0–17.0)
MCH: 32.8 pg (ref 26.0–34.0)
MCHC: 33.2 g/dL (ref 30.0–36.0)
MCV: 98.7 fL (ref 80.0–100.0)
Platelets: 119 10*3/uL — ABNORMAL LOW (ref 150–400)
RBC: 2.99 MIL/uL — ABNORMAL LOW (ref 4.22–5.81)
RDW: 18.1 % — ABNORMAL HIGH (ref 11.5–15.5)
WBC: 6.4 10*3/uL (ref 4.0–10.5)
nRBC: 0 % (ref 0.0–0.2)

## 2022-05-02 NOTE — Progress Notes (Signed)
[name] Progress Note   Subjective:    Last HD on 1/3 with 1.8 kg UF.  Per SW note she was able to reach his sister who is in agreement with SNF placement as he is unable to care for himself.  He has been agitated overnight.   Review of systems: Denies shortness of breath  Denies n/v; states is eating ok  Limited by ams   Objective Vitals:   05/01/22 1714 05/01/22 2029 05/01/22 2338 05/02/22 0352  BP: 139/74 (!) 144/83 126/73 (!) 140/80  Pulse: 96 (!) 105 90 96  Resp: '18 17 16 20  '$ Temp: 97.7 F (36.5 C) 98.1 F (36.7 C) 97.8 F (36.6 C) 98 F (36.7 C)  TempSrc: Oral Oral Oral Oral  SpO2: 93% 92% 94% 95%  Weight:    97.2 kg  Height:       Physical Exam    General adult male in bed in no acute distress HEENT normocephalic atraumatic extraocular movements intact sclera anicteric Neck supple trachea midline Lungs clear to auscultation bilaterally normal work of breathing at rest on room air  Heart S1S2 no rub Abdomen soft nontender distended Extremities no edema; left BKA  Psych no anxiety or agitation  Neuro alert and oriented to person; cannot give location and states "I can't tell you" to the question of "what year is it?" Access LUE AVF bruit and thrill    Additional Objective Labs: Basic Metabolic Panel: Recent Labs  Lab 04/27/22 1252 04/28/22 2210 04/30/22 1009  NA 134* 133* 133*  K 4.8 4.3 4.3  CL 95* 93* 95*  CO2 '25 25 23  '$ GLUCOSE 154* 203* 185*  BUN 77* 52* 91*  CREATININE 6.13* 4.27* 6.51*  CALCIUM 9.0 8.4* 8.8*  PHOS 4.5 3.4 4.6   Liver Function Tests: Recent Labs  Lab 04/27/22 1252 04/28/22 2210 04/30/22 1009  ALBUMIN 1.9* 1.9* 1.9*   No results for input(s): "LIPASE", "AMYLASE" in the last 168 hours. CBC: Recent Labs  Lab 04/25/22 1455 04/26/22 0113 04/27/22 1252 04/28/22 2210 04/30/22 1009  WBC 7.9 6.6 7.9 6.6 8.1  NEUTROABS 5.7 5.2 6.8  --   --   HGB 9.5* 10.2* 9.9* 10.8* 10.1*  HCT 27.9* 30.0* 28.7* 30.7*  29.8*  MCV 96.2 96.2 96.3 94.2 96.1  PLT 79* 99* 106* 117* 111*   Blood Culture    Component Value Date/Time   SDES TRACHEAL ASPIRATE 04/15/2022 1622   SPECREQUEST NONE 04/15/2022 1622   CULT  04/15/2022 1622    RARE Normal respiratory flora-no Staph aureus or Pseudomonas seen Performed at [facility] [address], [place], [place] [address]    REPTSTATUS 04/18/2022 FINAL 04/15/2022 1622    Cardiac Enzymes: No results for input(s): "CKTOTAL", "CKMB", "CKMBINDEX", "TROPONINI" in the last 168 hours. CBG: Recent Labs  Lab 04/29/22 0623 04/29/22 1135 04/29/22 2028 04/30/22 0000 04/30/22 0635  GLUCAP 100* 109* 174* 129* 97   Iron Studies: No results for input(s): "IRON", "TIBC", "TRANSFERRIN", "FERRITIN" in the last 72 hours. '@lablastinr3'$ @ Studies/Results: No results found. Medications:  anticoagulant sodium citrate      amLODipine  10 mg Oral Daily   arformoterol  15 mcg Nebulization BID   budesonide (PULMICORT) nebulizer solution  0.25 mg Nebulization BID   calcium carbonate (dosed in mg elemental calcium)  500 mg of elemental calcium Oral BID   Chlorhexidine Gluconate Cloth  6 each Topical V0350   folic acid  1 mg Oral Daily   heparin injection (subcutaneous)  5,000 Units Subcutaneous Q8H   lactulose  10 g Oral QID   multivitamin  1 tablet Oral QHS   mouth rinse  15 mL Mouth Rinse 4 times per day   pantoprazole  40 mg Oral BID AC   revefenacin  175 mcg Nebulization Daily    OP Dialysis Orders:  MWF [facility] 4h  400/A1.5    98.8kg   2K/2Ca bat   LUE AVF  Heparin none - Hectoral 54mg IV q HD - Appears to get IV iron with oncology, no ESA d/t cancer  Assessment/Plan:  Acute hypoxic respiratory failure - following large volume emesis of blood and was intubated in the ED.  Self-extubated 04/17/22, on HFNC which has been titrated down. Now on room air.   ABLA - acute on chronic with evidence of GI bleed.  Known AVM's.  GI consulted. s/p octreotide and on PPI  BID.  Transfuse prn. Hb improved.  Covid-19 Positive - s/p isolation per protocol - now off.  per pmd.  Per charting he tested positive for covid on 12/17  ESRD - Continue HD per MWF schedule - using AVF.   Obtain renal panel   Hypertension/volume  - CXR 12/23 diffuse bilat infiltrates. Not sure vol vs aspiration pna vs other.  optimize UF with HD   Anemia of CKD - Multiple transfusions this admission, last Hgb 10.8. Not on ESA due to hepatic cancer.   Metabolic bone disease -  Corrected calcium elevated thus VDRA was previously placed on hold - CCa still up on last check. Phosphorus is at goal. Changed calcium carbonate from TID to BID   Medical noncompliance - multiple admissions - ongoing issue  Hepatocellular carcinoma - s/p Y90 on 11/28/21.  Disposition - per primary team.  Per charting he is inpatient due to the need for SNF placement.  SW has reached his sister   LClaudia Desanctis MD 05/02/2022, 7:25 AM [employer]

## 2022-05-02 NOTE — Progress Notes (Signed)
PROGRESS NOTE  Daniel [name] EVO:[id number] DOB: 12/10/1960   PCP: [name], NP  Patient is from: Home.  DOA: 04/14/2022 LOS: 51  Chief complaints Chief Complaint  Patient presents with   Generalized Body Aches   Cough     Brief Narrative / Interim history: 62 year old M with PMH of PAD s/p left BKA, ESRD on HD MWF, DM-2, EtOH cirrhosis, HCC, GI bleed, duodenal ulcer and AVM returning with hematemesis on 12/18 after he left AMA on 12/17.  Tested positive for COVID on 12/17 after he presented with generalized body symptoms and left AMA.  He returned on 12/18 with hematemesis and intubated.  He self extubated on 12/21.  CODE STATUS changed to DNR/DNI on 12/24.  He was transfused 2 units of blood on 04/24/2022.  Eventually, he was stabilized and transferred to hospitalist service.   Hospital course complicated by delirium.  Respiratory failure resolved.  Nephrology and palliative medicine following.  Therapy recommended SNF.  Medically optimized for discharge  Subjective: Seen and examined earlier this morning while on dialysis.  No major events overnight of this morning.  No complaints.  Objective: Vitals:   05/02/22 1300 05/02/22 1333 05/02/22 1343 05/02/22 1506  BP: 114/65 108/64 110/71 124/76  Pulse: 95 94 94 89  Resp: 20 (!) '21 19 18  '$ Temp:   98.2 F (36.8 C) 98 F (36.7 C)  TempSrc:    Oral  SpO2: 96%  94% 94%  Weight:   90.2 kg   Height:        Examination:  GENERAL: No apparent distress.  Nontoxic. HEENT: MMM.  Vision and hearing grossly intact.  NECK: Supple.  No apparent JVD.  RESP:  No IWOB.  Fair aeration bilaterally. CVS:  RRR. Heart sounds normal.  ABD/GI/GU: BS+. Abd soft, NTND.  MSK/EXT:  Moves extremities. No apparent deformity. No edema.  SKIN: no apparent skin lesion or wound NEURO: Sleepy but wakes to voice.  Oriented to self and place.  Follows commands.  No apparent focal neuro deficit. PSYCH: Calm. Normal affect.    Procedures:   ETT from 12/18-12/21  Microbiology summarized: 12/19-MRSA PCR screen and respiratory cultures negative.  Assessment and plan: Principal Problem:   GIB (gastrointestinal bleeding) Active Problems:   ABLA (acute blood loss anemia)   Alcohol abuse   ESRD (end stage renal disease) (HCC)   Acute encephalopathy   Thrombocytopenia (HCC)   Peptic ulcer disease   Diabetes mellitus type 2, controlled, with complications ([place])   Physical deconditioning   Iron deficiency anemia due to chronic blood loss   Acute on chronic anemia   Cirrhosis of liver with ascites (HCC)   Symptomatic anemia   Pneumonia due to COVID-19 virus   Acute on chronic respiratory failure (HCC)   Protein-calorie malnutrition, severe   Acute blood loss anemia due to upper GI bleed/hematemesis: Has EtOH cirrhosis, duodenal ulcer, AVM, portal gastropathy and G1 EV-H&H stable since she received 2 units on 12/28. Recent Labs    04/21/22 0658 04/22/22 0620 04/23/22 0520 04/24/22 0332 04/25/22 1455 04/26/22 0113 04/27/22 1252 04/28/22 2210 04/30/22 1009 05/02/22 0752  HGB 7.3* 7.1* 6.7* 8.6* 9.5* 10.2* 9.9* 10.8* 10.1* 9.8*  -Continue PPI -Encouraged alcohol cessation.  Acute respiratory failure with hypoxia in the setting of COVID-19 infection, pneumonia and pulmonary edema: Intubated on 12/18 and self extubated on 12/21.  Currently on room air, DNR and DNI. -Completed 7 days of ceftriaxone.  Briefly received vancomycin as well. -Completed steroid course for COVID-19. -  Fluid management by HD per nephrology -Continue breathing treatments -Completed isolation precaution.   ESRD on iHD MWF, poorly compliant as an outpatient Hyponatremia, hyperkalemia, hyperphosphatemia -Nephrology managing.   Thrombocytopenia-chronic due to cirrhosis: Improving. Recent Labs  Lab 04/26/22 0113 04/27/22 1252 04/28/22 2210 04/30/22 1009 05/02/22 0752  PLT 99* 106* 117* 111* 119*  -Monitor intermittently  History of  EtOH cirrhosis/HCC -Outpatient follow-up.   Uncontrolled hypertension: BP elevated. -Increased amlodipine to 10 mg daily. -Change as needed hydralazine to p.o. -Fluid management by HD.   Acute metabolic encephalopathy/delirium: Should be outside withdrawal window for alcohol by now.  Oriented to self and place but limited insight.  Improving. -Reorientation and delirium precaution. -IV Haldol 2 mg every 6 hours as needed.  -Completed Librium taper. -Optimize electrolytes.  Controlled NIDDM-2: A1c 5.1%.  CBG fairly controlled. -Discontinued CBG monitoring and SSI insulin  Physical deconditioning/left BKA: Patient lives alone prior to hospitalization.  He has a left BKA and physically deconditioned.  He is only oriented to self and place.  No insight into why he is in the hospital.  Well-known history of noncompliance.  Cannot be home safely by himself.  Therapy recommended SNF.   Goal of care: DNR/DNI. -Palliative following.  Severe protein calorie malnutrition in the setting of alcohol abuse and chronic disease Body mass index is 27.73 kg/m. Nutrition Problem: Severe Malnutrition Etiology: chronic illness (alcoholic cirrhosis, hepatocellular carcinoma, ESRD) Signs/Symptoms: severe fat depletion, severe muscle depletion Interventions: MVI, Tube feeding    DVT prophylaxis:  heparin injection 5,000 Units Start: 04/19/22 2200 SCDs Start: 04/14/22 1640  Code Status: DNR/DNI Family Communication: None at bedside today. Level of care: Med-Surg Status is: Inpatient Remains inpatient appropriate because: SNF bed.     Final disposition: SNF Consultants:  Pulmonology admitted patient Nephrology Palliative medicine  Sch Meds:  Scheduled Meds:  amLODipine  10 mg Oral Daily   arformoterol  15 mcg Nebulization BID   budesonide (PULMICORT) nebulizer solution  0.25 mg Nebulization BID   calcium carbonate (dosed in mg elemental calcium)  500 mg of elemental calcium Oral BID    Chlorhexidine Gluconate Cloth  6 each Topical G2542   folic acid  1 mg Oral Daily   heparin injection (subcutaneous)  5,000 Units Subcutaneous Q8H   lactulose  10 g Oral QID   multivitamin  1 tablet Oral QHS   mouth rinse  15 mL Mouth Rinse 4 times per day   pantoprazole  40 mg Oral BID AC   revefenacin  175 mcg Nebulization Daily   Continuous Infusions:  anticoagulant sodium citrate     PRN Meds:.acetaminophen **OR** acetaminophen, albuterol, alteplase, anticoagulant sodium citrate, docusate sodium, guaiFENesin-dextromethorphan, haloperidol lactate, heparin, hydrALAZINE, lidocaine (PF), lidocaine-prilocaine, ondansetron **OR** ondansetron (ZOFRAN) IV, mouth rinse, pentafluoroprop-tetrafluoroeth, polyethylene glycol  Antimicrobials: Anti-infectives (From admission, onward)    Start     Dose/Rate Route Frequency Ordered Stop   04/18/22 1200  vancomycin (VANCOCIN) IVPB 1000 mg/200 mL premix  Status:  Discontinued        1,000 mg 200 mL/hr over 60 Minutes Intravenous Every M-W-F (Hemodialysis) 04/16/22 1428 04/17/22 0727   04/16/22 1000  remdesivir 100 mg in sodium chloride 0.9 % 100 mL IVPB       See Hyperspace for full Linked Orders Report.   100 mg 200 mL/hr over 30 Minutes Intravenous Daily 04/15/22 1244 04/17/22 1133   04/15/22 1445  vancomycin (VANCOREADY) IVPB 2000 mg/400 mL        2,000 mg 200 mL/hr over 120 Minutes  Intravenous  Once 04/15/22 1354 04/15/22 1823   04/15/22 1354  vancomycin variable dose per unstable renal function (pharmacist dosing)  Status:  Discontinued         Does not apply See admin instructions 04/15/22 1354 04/17/22 1206   04/15/22 1330  remdesivir 200 mg in sodium chloride 0.9% 250 mL IVPB       See Hyperspace for full Linked Orders Report.   200 mg 580 mL/hr over 30 Minutes Intravenous Once 04/15/22 1244 04/15/22 1526   04/15/22 1330  cefTRIAXone (ROCEPHIN) 2 g in sodium chloride 0.9 % 100 mL IVPB  Status:  Discontinued        2 g 200 mL/hr over 30  Minutes Intravenous Every 24 hours 04/15/22 1244 04/22/22 1219   04/14/22 1445  cefTRIAXone (ROCEPHIN) 1 g in sodium chloride 0.9 % 100 mL IVPB        1 g 200 mL/hr over 30 Minutes Intravenous  Once 04/14/22 1438 04/14/22 1540        I have personally reviewed the following labs and images: CBC: Recent Labs  Lab 04/26/22 0113 04/27/22 1252 04/28/22 2210 04/30/22 1009 05/02/22 0752  WBC 6.6 7.9 6.6 8.1 6.4  NEUTROABS 5.2 6.8  --   --   --   HGB 10.2* 9.9* 10.8* 10.1* 9.8*  HCT 30.0* 28.7* 30.7* 29.8* 29.5*  MCV 96.2 96.3 94.2 96.1 98.7  PLT 99* 106* 117* 111* 119*   BMP &GFR Recent Labs  Lab 04/26/22 0113 04/27/22 1252 04/28/22 2210 04/30/22 1009 05/02/22 0752  NA 136 134* 133* 133* 139  K 4.2 4.8 4.3 4.3 5.0  CL 95* 95* 93* 95* 100  CO2 '28 25 25 23 27  '$ GLUCOSE 128* 154* 203* 185* 145*  BUN 40* 77* 52* 91* 88*  CREATININE 4.36* 6.13* 4.27* 6.51* 6.26*  CALCIUM 8.9 9.0 8.4* 8.8* 8.7*  MG 1.9 2.2  --   --   --   PHOS 3.0 4.5 3.4 4.6 3.0   Estimated Creatinine Clearance: 13 mL/min (A) (by C-G formula based on SCr of 6.26 mg/dL (H)). Liver & Pancreas: Recent Labs  Lab 04/26/22 0113 04/27/22 1252 04/28/22 2210 04/30/22 1009 05/02/22 0752  ALBUMIN 1.9* 1.9* 1.9* 1.9* 1.9*   No results for input(s): "LIPASE", "AMYLASE" in the last 168 hours. No results for input(s): "AMMONIA" in the last 168 hours. Diabetic: No results for input(s): "HGBA1C" in the last 72 hours. Recent Labs  Lab 04/29/22 0623 04/29/22 1135 04/29/22 2028 04/30/22 0000 04/30/22 0635  GLUCAP 100* 109* 174* 129* 97   Cardiac Enzymes: No results for input(s): "CKTOTAL", "CKMB", "CKMBINDEX", "TROPONINI" in the last 168 hours. No results for input(s): "PROBNP" in the last 8760 hours. Coagulation Profile: No results for input(s): "INR", "PROTIME" in the last 168 hours. Thyroid Function Tests: No results for input(s): "TSH", "T4TOTAL", "FREET4", "T3FREE", "THYROIDAB" in the last 72  hours. Lipid Profile: No results for input(s): "CHOL", "HDL", "LDLCALC", "TRIG", "CHOLHDL", "LDLDIRECT" in the last 72 hours. Anemia Panel: No results for input(s): "VITAMINB12", "FOLATE", "FERRITIN", "TIBC", "IRON", "RETICCTPCT" in the last 72 hours. Urine analysis:    Component Value Date/Time   COLORURINE YELLOW 12/09/2021 0900   APPEARANCEUR CLEAR 12/09/2021 0900   LABSPEC 1.008 12/09/2021 0900   PHURINE 5.0 12/09/2021 0900   GLUCOSEU NEGATIVE 12/09/2021 0900   HGBUR MODERATE (A) 12/09/2021 0900   BILIRUBINUR NEGATIVE 12/09/2021 0900   KETONESUR NEGATIVE 12/09/2021 0900   PROTEINUR 100 (A) 12/09/2021 0900   UROBILINOGEN 0.2 12/31/2009 0746  NITRITE NEGATIVE 12/09/2021 0900   LEUKOCYTESUR NEGATIVE 12/09/2021 0900   Sepsis Labs: Invalid input(s): "PROCALCITONIN", "LACTICIDVEN"  Microbiology: No results found for this or any previous visit (from the past 240 hour(s)).  Radiology Studies: No results found.    Gonsalo Cuthbertson T. Deadwood  If 7PM-7AM, please contact night-coverage [email or web address] 05/02/2022, 4:50 PM

## 2022-05-02 NOTE — Procedures (Signed)
Seen and examined on dialysis.  Blood pressure 145/78 and HR 99.  Left AVF in use.  He is agitated and moving his arm fistula - the machine is alarming.  ICU earlier this week and patient with AMS.  We will offer restraint today but if this continues and is not reversible then he may no longer be a candidate for dialysis.    Claudia Desanctis, MD 05/02/2022  9:50 AM

## 2022-05-02 NOTE — TOC Progression Note (Signed)
Transition of Care [facility]) - Progression Note    Patient Details  Name: Daniel [name] MRN: [id number] Date of Birth: [birth year]  Transition of Care [facility]) CM/SW [place], [place] Phone Number: 05/02/2022, 4:18 PM  Clinical Narrative:      CSW spoke with [place] patients sister and provided SNF bed offers. Patients sister chose [facility] for SNF placement for patient. Juliann Pulse confirmed SNF bed for patient.CSW provided [place] with [facility] patients HD information. Juliann Pulse confirmed facility will set up HD transport for patient. CSW informed Designer, multimedia.CSW will continue to follow and assist with patients dc planning needs.   Expected Discharge Plan: Skilled Nursing Facility Barriers to Discharge: Ship broker, SNF Pending bed offer  Expected Discharge Plan and Services In-house Referral: Clinical Social Work Discharge Planning Services: CM Consult Post Acute Care Choice: [place] arrangements for the past 2 months: Single Family Home                 DME Arranged: N/A         HH Arranged: RN, Disease Management, PT, OT HH Agency: [facility] Date [place]: 04/15/22 Time [place]: [id number] Representative spoke with at [place]: Kerry Dory.   Social Determinants of Health ([place]) Interventions SDOH Screenings   Food Insecurity: No Food Insecurity (04/06/2022)  Housing: Low Risk  (04/06/2022)  Recent Concern: Housing - High Risk (03/25/2022)  Transportation Needs: No Transportation Needs (04/06/2022)  Recent Concern: Transportation Needs - Unmet Transportation Needs (03/04/2022)  Utilities: Not At Risk (04/06/2022)  Alcohol Screen: Low Risk  (11/01/2021)  Depression (PHQ2-9): Low Risk  (12/31/2021)  Financial Resource Strain: High Risk (11/01/2021)  Physical Activity: Insufficiently Active (11/01/2021)  Social Connections: Socially Isolated (11/01/2021)  Stress: No Stress Concern Present (11/01/2021)  Tobacco Use: High Risk  (04/14/2022)    Readmission Risk Interventions    05/01/2022    5:04 PM 10/10/2021   10:49 AM 02/27/2021   12:51 PM  Readmission Risk Prevention Plan  Transportation Screening Complete Complete Complete  Medication Review Press photographer) Complete Complete Complete  PCP or Specialist appointment within 3-5 days of discharge Complete Complete Complete  HRI or Home Care Consult Complete Complete Complete  SW Recovery Care/Counseling Consult Complete Complete Complete  Palliative Care Screening Complete Complete Not Applicable  [place] Complete Patient Refused Not Applicable

## 2022-05-02 NOTE — Progress Notes (Signed)
Received patient in bed to unit.  Alert and oriented.  Informed consent signed and in chart.   Treatment initiated: 09:49 Treatment completed: 13:33  Patient tolerated well.  Transported back to the room  Alert, without acute distress.  Hand-off given to patient's nurse.   Access used: left AVF Access issues: none  Total UF removed: 2.5L Medication(s) given: none    05/02/22 1333  Vitals  BP 108/64  MAP (mmHg) 78  BP Location Right Arm  BP Method Automatic  Patient Position (if appropriate) Lying  Pulse Rate 94  Pulse Rate Source Monitor  ECG Heart Rate 96  Resp (!) 21  During Treatment Monitoring  HD Safety Checks Performed Yes  Intra-Hemodialysis Comments Tx completed  Dialysis Fluid Bolus Normal Saline  Bolus Amount (mL) 300 mL      [name]

## 2022-05-02 NOTE — Progress Notes (Signed)
Pt in bed no distress noted stable for HD

## 2022-05-03 DIAGNOSIS — E118 Type 2 diabetes mellitus with unspecified complications: Secondary | ICD-10-CM | POA: Diagnosis not present

## 2022-05-03 DIAGNOSIS — G9340 Encephalopathy, unspecified: Secondary | ICD-10-CM | POA: Diagnosis not present

## 2022-05-03 DIAGNOSIS — K920 Hematemesis: Secondary | ICD-10-CM | POA: Diagnosis not present

## 2022-05-03 DIAGNOSIS — D62 Acute posthemorrhagic anemia: Secondary | ICD-10-CM | POA: Diagnosis not present

## 2022-05-03 NOTE — Progress Notes (Addendum)
[facility] Progress Note   Subjective:    Completed dialysis yesterday. Net UF 2.5L. No issues with treatment.   Review of systems: Denies shortness of breath  Denies n/v; states is eating ok  Limited by ams   Objective Vitals:   05/02/22 1941 05/02/22 2300 05/03/22 0835 05/03/22 0841  BP: 137/83 (!) 176/87  (!) 140/89  Pulse: 95 84  90  Resp: '16 17  18  '$ Temp: 98.2 F (36.8 C) 98 F (36.7 C)  97.8 F (36.6 C)  TempSrc: Oral Oral  Axillary  SpO2: 94% 95% 96% 98%  Weight:      Height:       Physical Exam    General adult male in bed in no acute distress Lungs clear to auscultation bilaterally normal work of breathing at rest on room air  Heart S1S2 no rub Abdomen soft nontender distended Extremities no edema; left BKA  Psych no anxiety or agitation  Neuro alert and oriented to person; Access LUE AVF bruit and thrill    Additional Objective Labs: Basic Metabolic Panel: Recent Labs  Lab 04/28/22 2210 04/30/22 1009 05/02/22 0752  NA 133* 133* 139  K 4.3 4.3 5.0  CL 93* 95* 100  CO2 '25 23 27  '$ GLUCOSE 203* 185* 145*  BUN 52* 91* 88*  CREATININE 4.27* 6.51* 6.26*  CALCIUM 8.4* 8.8* 8.7*  PHOS 3.4 4.6 3.0    Liver Function Tests: Recent Labs  Lab 04/28/22 2210 04/30/22 1009 05/02/22 0752  ALBUMIN 1.9* 1.9* 1.9*    No results for input(s): "LIPASE", "AMYLASE" in the last 168 hours. CBC: Recent Labs  Lab 04/27/22 1252 04/28/22 2210 04/30/22 1009 05/02/22 0752  WBC 7.9 6.6 8.1 6.4  NEUTROABS 6.8  --   --   --   HGB 9.9* 10.8* 10.1* 9.8*  HCT 28.7* 30.7* 29.8* 29.5*  MCV 96.3 94.2 96.1 98.7  PLT 106* 117* 111* 119*    Blood Culture    Component Value Date/Time   SDES TRACHEAL ASPIRATE 04/15/2022 1622   SPECREQUEST NONE 04/15/2022 1622   CULT  04/15/2022 1622    RARE Normal respiratory flora-no Staph aureus or Pseudomonas seen Performed at [place] [address], [place], [place] [address]    REPTSTATUS  04/18/2022 FINAL 04/15/2022 1622    Cardiac Enzymes: No results for input(s): "CKTOTAL", "CKMB", "CKMBINDEX", "TROPONINI" in the last 168 hours. CBG: Recent Labs  Lab 04/29/22 0623 04/29/22 1135 04/29/22 2028 04/30/22 0000 04/30/22 0635  GLUCAP 100* 109* 174* 129* 97    Iron Studies: No results for input(s): "IRON", "TIBC", "TRANSFERRIN", "FERRITIN" in the last 72 hours. '@lablastinr3'$ @ Studies/Results: No results found. Medications:  anticoagulant sodium citrate      amLODipine  10 mg Oral Daily   arformoterol  15 mcg Nebulization BID   budesonide (PULMICORT) nebulizer solution  0.25 mg Nebulization BID   calcium carbonate (dosed in mg elemental calcium)  500 mg of elemental calcium Oral BID   Chlorhexidine Gluconate Cloth  6 each Topical W2956   folic acid  1 mg Oral Daily   heparin injection (subcutaneous)  5,000 Units Subcutaneous Q8H   lactulose  10 g Oral QID   multivitamin  1 tablet Oral QHS   mouth rinse  15 mL Mouth Rinse 4 times per day   pantoprazole  40 mg Oral BID AC   revefenacin  175 mcg Nebulization Daily    OP Dialysis Orders:  MWF [place] 4h  400/A1.5  98.8kg   2K/2Ca bat   LUE AVF  Heparin none - Hectoral 3mg IV q HD - Appears to get IV iron with oncology, no ESA d/t cancer  Assessment/Plan:  Acute hypoxic respiratory failure - following large volume emesis of blood and was intubated in the ED.  Self-extubated 04/17/22, on HFNC which has been titrated down. Now on room air.   ABLA - acute on chronic with evidence of GI bleed.  Known AVM's.  GI consulted. s/p octreotide and on PPI BID.  Transfuse prn. Hb improved.  Covid-19 Positive - s/p isolation per protocol - now off.  per pmd.  Per charting he tested positive for covid on 12/17  ESRD - Continue HD per MWF schedule - using AVF.     Hypertension/volume  - CXR 12/23 diffuse bilat infiltrates. Not sure vol vs aspiration pna vs other.  optimize UF with HD   Anemia of CKD - Multiple transfusions this  admission, Hb appears to have stabilized. Hb 9.8  Not on ESA due to hepatic cancer.   Metabolic bone disease -  Corrected calcium elevated thus VDRA was previously placed on hold - CCa still up on last check. Phosphorus is at goal. Changed calcium carbonate from TID to BID   Medical noncompliance - multiple admissions - ongoing issue  Hepatocellular carcinoma - s/p Y90 on 11/28/21.  Disposition - per primary team.  Per charting he is inpatient due to the need for SNF placement.  SW has reached his sister   OLynnda ChildPA-C [facility] 05/03/2022,9:30 AM  I have seen and examined this patient and agree with the plan of care. No absolute indication for RRT and the patient appears to be  comfortable. Next HD on Monday.  LDwana Melena MD 05/03/2022, 11:10 AM

## 2022-05-03 NOTE — Progress Notes (Signed)
PROGRESS NOTE  Daniel [name] WIO:[id number] DOB: Jun 15, 1960   PCP: [name], NP  Patient is from: Home.  DOA: 04/14/2022 LOS: 23  Chief complaints Chief Complaint  Patient presents with   Generalized Body Aches   Cough     Brief Narrative / Interim history: 62 year old M with PMH of PAD s/p left BKA, ESRD on HD MWF, DM-2, EtOH cirrhosis, HCC, GI bleed, duodenal ulcer and AVM returning with hematemesis on 12/18 after he left AMA on 12/17.  Tested positive for COVID on 12/17 after he presented with generalized body symptoms and left AMA.  He returned on 12/18 with hematemesis and intubated.  He self extubated on 12/21.  CODE STATUS changed to DNR/DNI on 12/24.  He was transfused 2 units of blood on 04/24/2022.  Eventually, he was stabilized and transferred to hospitalist service.   Hospital course complicated by delirium.  Respiratory failure resolved.  Nephrology and palliative medicine following.  Therapy recommended SNF.  Medically optimized for discharge  Subjective: Seen and examined earlier this morning.  No major events overnight of this morning.  No complaints.   Objective: Vitals:   05/02/22 1941 05/02/22 2300 05/03/22 0835 05/03/22 0841  BP: 137/83 (!) 176/87  (!) 140/89  Pulse: 95 84  90  Resp: '16 17  18  '$ Temp: 98.2 F (36.8 C) 98 F (36.7 C)  97.8 F (36.6 C)  TempSrc: Oral Oral  Axillary  SpO2: 94% 95% 96% 98%  Weight:      Height:        Examination:  GENERAL: No apparent distress.  Nontoxic. HEENT: MMM.  Vision and hearing grossly intact.  NECK: Supple.  No apparent JVD.  RESP:  No IWOB.  Fair aeration bilaterally. CVS:  RRR. Heart sounds normal.  ABD/GI/GU: BS+. Abd soft, NTND.  MSK/EXT:  Moves extremities. No apparent deformity. No edema.  SKIN: no apparent skin lesion or wound NEURO: Awake and alert. Oriented to self and place.  No apparent focal neuro deficit. PSYCH: Calm. Normal affect.    Procedures:  ETT from  12/18-12/21  Microbiology summarized: 12/19-MRSA PCR screen and respiratory cultures negative.  Assessment and plan: Principal Problem:   GIB (gastrointestinal bleeding) Active Problems:   ABLA (acute blood loss anemia)   Alcohol abuse   ESRD (end stage renal disease) (HCC)   Acute encephalopathy   Thrombocytopenia (HCC)   Peptic ulcer disease   Diabetes mellitus type 2, controlled, with complications ([place])   Physical deconditioning   Iron deficiency anemia due to chronic blood loss   Acute on chronic anemia   Cirrhosis of liver with ascites (HCC)   Symptomatic anemia   Pneumonia due to COVID-19 virus   Acute on chronic respiratory failure (HCC)   Protein-calorie malnutrition, severe   Acute blood loss anemia due to upper GI bleed/hematemesis: Has EtOH cirrhosis, duodenal ulcer, AVM, portal gastropathy and G1 EV-H&H stable since she received 2 units on 12/28. Recent Labs    04/21/22 0658 04/22/22 0620 04/23/22 0520 04/24/22 0332 04/25/22 1455 04/26/22 0113 04/27/22 1252 04/28/22 2210 04/30/22 1009 05/02/22 0752  HGB 7.3* 7.1* 6.7* 8.6* 9.5* 10.2* 9.9* 10.8* 10.1* 9.8*  -Continue PPI -Encouraged alcohol cessation.  Acute respiratory failure with hypoxia in the setting of COVID-19 infection, pneumonia and pulmonary edema: Intubated on 12/18 and self extubated on 12/21.  Currently on room air, DNR and DNI. -Completed 7 days of ceftriaxone.  Briefly received vancomycin as well. -Completed steroid course for COVID-19. -Fluid management by HD per  nephrology -Continue breathing treatments -Completed isolation precaution.   ESRD on iHD MWF, poorly compliant as an outpatient Hyponatremia, hyperkalemia, hyperphosphatemia -Nephrology managing.   Thrombocytopenia-chronic due to cirrhosis: Improving. Recent Labs  Lab 04/27/22 1252 04/28/22 2210 04/30/22 1009 05/02/22 0752  PLT 106* 117* 111* 119*  -Monitor intermittently  History of EtOH cirrhosis/HCC -Outpatient  follow-up.   Uncontrolled hypertension: BP elevated. -Increased amlodipine to 10 mg daily. -P.o. hydralazine as needed. -Fluid management by HD.   Acute metabolic encephalopathy/delirium: Should be outside withdrawal window for alcohol by now.  Oriented to self and place but limited insight.  Improving. -Reorientation and delirium precaution. -IV Haldol 2 mg every 6 hours as needed.  -Completed Librium taper. -Optimize electrolytes.  Controlled NIDDM-2: A1c 5.1%.  CBG fairly controlled. -Discontinued CBG monitoring and SSI insulin  Physical deconditioning/left BKA: Patient lives alone prior to hospitalization.  He has a left BKA and physically deconditioned.  He is only oriented to self and place.  No insight into why he is in the hospital.  Well-known history of noncompliance.  Cannot be home safely by himself.  Therapy recommended SNF.   Goal of care: DNR/DNI. -Palliative following.  Severe protein calorie malnutrition in the setting of alcohol abuse and chronic disease Body mass index is 27.73 kg/m. Nutrition Problem: Severe Malnutrition Etiology: chronic illness (alcoholic cirrhosis, hepatocellular carcinoma, ESRD) Signs/Symptoms: severe fat depletion, severe muscle depletion Interventions: MVI, Tube feeding    DVT prophylaxis:  heparin injection 5,000 Units Start: 04/19/22 2200 SCDs Start: 04/14/22 1640  Code Status: DNR/DNI Family Communication: None at bedside today. Level of care: Med-Surg Status is: Inpatient Remains inpatient appropriate because: SNF bed.     Final disposition: SNF Consultants:  Pulmonology admitted patient Nephrology Palliative medicine  Sch Meds:  Scheduled Meds:  amLODipine  10 mg Oral Daily   arformoterol  15 mcg Nebulization BID   budesonide (PULMICORT) nebulizer solution  0.25 mg Nebulization BID   calcium carbonate (dosed in mg elemental calcium)  500 mg of elemental calcium Oral BID   Chlorhexidine Gluconate Cloth  6 each  Topical [id number]   folic acid  1 mg Oral Daily   heparin injection (subcutaneous)  5,000 Units Subcutaneous Q8H   lactulose  10 g Oral QID   multivitamin  1 tablet Oral QHS   mouth rinse  15 mL Mouth Rinse 4 times per day   pantoprazole  40 mg Oral BID AC   revefenacin  175 mcg Nebulization Daily   Continuous Infusions:  anticoagulant sodium citrate     PRN Meds:.acetaminophen **OR** acetaminophen, albuterol, alteplase, anticoagulant sodium citrate, docusate sodium, guaiFENesin-dextromethorphan, haloperidol lactate, heparin, hydrALAZINE, lidocaine (PF), lidocaine-prilocaine, ondansetron **OR** ondansetron (ZOFRAN) IV, mouth rinse, pentafluoroprop-tetrafluoroeth, polyethylene glycol  Antimicrobials: Anti-infectives (From admission, onward)    Start     Dose/Rate Route Frequency Ordered Stop   04/18/22 1200  vancomycin (VANCOCIN) IVPB 1000 mg/200 mL premix  Status:  Discontinued        1,000 mg 200 mL/hr over 60 Minutes Intravenous Every M-W-F (Hemodialysis) 04/16/22 1428 04/17/22 0727   04/16/22 1000  remdesivir 100 mg in sodium chloride 0.9 % 100 mL IVPB       See Hyperspace for full Linked Orders Report.   100 mg 200 mL/hr over 30 Minutes Intravenous Daily 04/15/22 1244 04/17/22 1133   04/15/22 1445  vancomycin (VANCOREADY) IVPB 2000 mg/400 mL        2,000 mg 200 mL/hr over 120 Minutes Intravenous  Once 04/15/22 1354 04/15/22 1823   04/15/22  1354  vancomycin variable dose per unstable renal function (pharmacist dosing)  Status:  Discontinued         Does not apply See admin instructions 04/15/22 1354 04/17/22 1206   04/15/22 1330  remdesivir 200 mg in sodium chloride 0.9% 250 mL IVPB       See Hyperspace for full Linked Orders Report.   200 mg 580 mL/hr over 30 Minutes Intravenous Once 04/15/22 1244 04/15/22 1526   04/15/22 1330  cefTRIAXone (ROCEPHIN) 2 g in sodium chloride 0.9 % 100 mL IVPB  Status:  Discontinued        2 g 200 mL/hr over 30 Minutes Intravenous Every 24 hours  04/15/22 1244 04/22/22 1219   04/14/22 1445  cefTRIAXone (ROCEPHIN) 1 g in sodium chloride 0.9 % 100 mL IVPB        1 g 200 mL/hr over 30 Minutes Intravenous  Once 04/14/22 1438 04/14/22 1540        I have personally reviewed the following labs and images: CBC: Recent Labs  Lab 04/27/22 1252 04/28/22 2210 04/30/22 1009 05/02/22 0752  WBC 7.9 6.6 8.1 6.4  NEUTROABS 6.8  --   --   --   HGB 9.9* 10.8* 10.1* 9.8*  HCT 28.7* 30.7* 29.8* 29.5*  MCV 96.3 94.2 96.1 98.7  PLT 106* 117* 111* 119*   BMP &GFR Recent Labs  Lab 04/27/22 1252 04/28/22 2210 04/30/22 1009 05/02/22 0752  NA 134* 133* 133* 139  K 4.8 4.3 4.3 5.0  CL 95* 93* 95* 100  CO2 '25 25 23 27  '$ GLUCOSE 154* 203* 185* 145*  BUN 77* 52* 91* 88*  CREATININE 6.13* 4.27* 6.51* 6.26*  CALCIUM 9.0 8.4* 8.8* 8.7*  MG 2.2  --   --   --   PHOS 4.5 3.4 4.6 3.0   Estimated Creatinine Clearance: 13 mL/min (A) (by C-G formula based on SCr of 6.26 mg/dL (H)). Liver & Pancreas: Recent Labs  Lab 04/27/22 1252 04/28/22 2210 04/30/22 1009 05/02/22 0752  ALBUMIN 1.9* 1.9* 1.9* 1.9*   No results for input(s): "LIPASE", "AMYLASE" in the last 168 hours. No results for input(s): "AMMONIA" in the last 168 hours. Diabetic: No results for input(s): "HGBA1C" in the last 72 hours. Recent Labs  Lab 04/29/22 0623 04/29/22 1135 04/29/22 2028 04/30/22 0000 04/30/22 0635  GLUCAP 100* 109* 174* 129* 97   Cardiac Enzymes: No results for input(s): "CKTOTAL", "CKMB", "CKMBINDEX", "TROPONINI" in the last 168 hours. No results for input(s): "PROBNP" in the last 8760 hours. Coagulation Profile: No results for input(s): "INR", "PROTIME" in the last 168 hours. Thyroid Function Tests: No results for input(s): "TSH", "T4TOTAL", "FREET4", "T3FREE", "THYROIDAB" in the last 72 hours. Lipid Profile: No results for input(s): "CHOL", "HDL", "LDLCALC", "TRIG", "CHOLHDL", "LDLDIRECT" in the last 72 hours. Anemia Panel: No results for  input(s): "VITAMINB12", "FOLATE", "FERRITIN", "TIBC", "IRON", "RETICCTPCT" in the last 72 hours. Urine analysis:    Component Value Date/Time   COLORURINE YELLOW 12/09/2021 0900   APPEARANCEUR CLEAR 12/09/2021 0900   LABSPEC 1.008 12/09/2021 0900   PHURINE 5.0 12/09/2021 0900   GLUCOSEU NEGATIVE 12/09/2021 0900   HGBUR MODERATE (A) 12/09/2021 0900   BILIRUBINUR NEGATIVE 12/09/2021 0900   KETONESUR NEGATIVE 12/09/2021 0900   PROTEINUR 100 (A) 12/09/2021 0900   UROBILINOGEN 0.2 12/31/2009 0746   NITRITE NEGATIVE 12/09/2021 0900   LEUKOCYTESUR NEGATIVE 12/09/2021 0900   Sepsis Labs: Invalid input(s): "PROCALCITONIN", "LACTICIDVEN"  Microbiology: No results found for this or any previous visit (from the  past 240 hour(s)).  Radiology Studies: No results found.    Brezlyn Manrique T. [place]  If 7PM-7AM, please contact night-coverage [email or web address] 05/03/2022, 12:24 PM

## 2022-05-04 DIAGNOSIS — G9340 Encephalopathy, unspecified: Secondary | ICD-10-CM | POA: Diagnosis not present

## 2022-05-04 DIAGNOSIS — E118 Type 2 diabetes mellitus with unspecified complications: Secondary | ICD-10-CM | POA: Diagnosis not present

## 2022-05-04 DIAGNOSIS — D62 Acute posthemorrhagic anemia: Secondary | ICD-10-CM | POA: Diagnosis not present

## 2022-05-04 DIAGNOSIS — K920 Hematemesis: Secondary | ICD-10-CM | POA: Diagnosis not present

## 2022-05-04 MED ORDER — TRAMADOL HCL 50 MG PO TABS
50.0000 mg | ORAL_TABLET | Freq: Two times a day (BID) | ORAL | Status: DC | PRN
  Administered 2022-05-04: 50 mg via ORAL
  Filled 2022-05-04: qty 1

## 2022-05-04 MED ORDER — HALOPERIDOL LACTATE 5 MG/ML IJ SOLN: 4.0000 mg | Freq: Two times a day (BID) | INTRAMUSCULAR | Status: DC | PRN

## 2022-05-04 MED ORDER — OXYCODONE HCL 5 MG PO TABS
5.0000 mg | ORAL_TABLET | Freq: Three times a day (TID) | ORAL | Status: DC | PRN
  Administered 2022-05-04: 5 mg via ORAL
  Filled 2022-05-04: qty 1

## 2022-05-04 MED ORDER — CHLORHEXIDINE GLUCONATE CLOTH 2 % EX PADS
6.0000 | MEDICATED_PAD | Freq: Every day | CUTANEOUS | Status: DC
  Administered 2022-05-04 – 2022-05-05 (×2): 6 via TOPICAL

## 2022-05-04 MED ORDER — ACETAMINOPHEN 325 MG PO TABS
650.0000 mg | ORAL_TABLET | Freq: Four times a day (QID) | ORAL | Status: DC
  Administered 2022-05-04 – 2022-05-05 (×4): 650 mg via ORAL
  Filled 2022-05-04 (×4): qty 2

## 2022-05-04 NOTE — Progress Notes (Signed)
PROGRESS NOTE  Daniel [name] RFF:[id number] DOB: 02-27-1961   PCP: [name], NP  Patient is from: Home.  DOA: 04/14/2022 LOS: 71  Chief complaints Chief Complaint  Patient presents with   Generalized Body Aches   Cough     Brief Narrative / Interim history: 62 year old M with PMH of PAD s/p left BKA, ESRD on HD MWF, DM-2, EtOH cirrhosis, HCC, GI bleed, duodenal ulcer and AVM returning with hematemesis on 12/18 after he left AMA on 12/17.  Tested positive for COVID on 12/17 after he presented with generalized body symptoms and left AMA.  He returned on 12/18 with hematemesis and intubated.  He self extubated on 12/21.  CODE STATUS changed to DNR/DNI on 12/24.  He was transfused 2 units of blood on 04/24/2022.  Eventually, he was stabilized and transferred to hospitalist service.   Hospital course complicated by delirium.  Respiratory failure resolved.  Nephrology and palliative medicine following.  Therapy recommended SNF.  Medically optimized for discharge  Subjective: Seen and examined earlier this morning.  No major events overnight of this morning.  Sitting in bed eating breakfast.  No complaints.  Objective: Vitals:   05/04/22 0537 05/04/22 0722 05/04/22 0811 05/04/22 1257  BP: 130/87  (!) 150/82 (!) 157/91  Pulse: 98  95 97  Resp: '16  17 15  '$ Temp: 98.3 F (36.8 C)  98.2 F (36.8 C) 97.6 F (36.4 C)  TempSrc: Oral  Oral Oral  SpO2: 90% 93% 94% 96%  Weight:      Height:        Examination: GENERAL: No apparent distress.  Nontoxic. HEENT: MMM.  Vision and hearing grossly intact.  NECK: Supple.  No apparent JVD.  RESP:  No IWOB.  Fair aeration bilaterally. CVS:  RRR. Heart sounds normal.  ABD/GI/GU: BS+. Abd soft, NTND.  MSK/EXT:  Moves extremities.  Left BKA. SKIN: no apparent skin lesion or wound NEURO: Awake and alert. Oriented to self and place.  No apparent focal neuro deficit. PSYCH: Calm. Normal affect.    Procedures:  ETT from  12/18-12/21  Microbiology summarized: 12/19-MRSA PCR screen and respiratory cultures negative.  Assessment and plan: Principal Problem:   GIB (gastrointestinal bleeding) Active Problems:   ABLA (acute blood loss anemia)   Alcohol abuse   ESRD (end stage renal disease) (HCC)   Acute encephalopathy   Thrombocytopenia (HCC)   Peptic ulcer disease   Diabetes mellitus type 2, controlled, with complications ([place])   Physical deconditioning   Iron deficiency anemia due to chronic blood loss   Acute on chronic anemia   Cirrhosis of liver with ascites (HCC)   Symptomatic anemia   Pneumonia due to COVID-19 virus   Acute on chronic respiratory failure (HCC)   Protein-calorie malnutrition, severe   Acute blood loss anemia due to upper GI bleed/hematemesis: Has EtOH cirrhosis, duodenal ulcer, AVM, portal gastropathy and G1 EV-H&H stable since she received 2 units on 12/28. Recent Labs    04/21/22 0658 04/22/22 0620 04/23/22 0520 04/24/22 0332 04/25/22 1455 04/26/22 0113 04/27/22 1252 04/28/22 2210 04/30/22 1009 05/02/22 0752  HGB 7.3* 7.1* 6.7* 8.6* 9.5* 10.2* 9.9* 10.8* 10.1* 9.8*  -Continue PPI -Encouraged alcohol cessation.  Acute respiratory failure with hypoxia in the setting of COVID-19 infection, pneumonia and pulmonary edema: Intubated on 12/18 and self extubated on 12/21.  Currently on room air, DNR and DNI. -Completed 7 days of ceftriaxone.  Briefly received vancomycin as well. -Completed steroid course for COVID-19. -Fluid management by HD  per nephrology -Continue breathing treatments -Completed isolation precaution.   ESRD on iHD MWF, poorly compliant as an outpatient Hyponatremia, hyperkalemia, hyperphosphatemia -Nephrology managing.   Thrombocytopenia-chronic due to cirrhosis: Improving. Recent Labs  Lab 04/28/22 2210 04/30/22 1009 05/02/22 0752  PLT 117* 111* 119*  -Monitor intermittently  History of EtOH cirrhosis/HCC -Outpatient follow-up.    Uncontrolled hypertension: BP elevated. -Increased amlodipine to 10 mg daily. -P.o. hydralazine as needed. -Fluid management by HD.   Acute metabolic encephalopathy/delirium: Should be outside withdrawal window for alcohol by now.  Oriented to self and place but limited insight.  Improving. -Reorientation and delirium precaution. -IV Haldol 2 mg every 6 hours as needed.  -Completed Librium taper. -Optimize electrolytes.  Controlled NIDDM-2: A1c 5.1%.  CBG fairly controlled. -Discontinued CBG monitoring and SSI insulin  Physical deconditioning/left BKA: Patient lives alone prior to hospitalization.  He has a left BKA and physically deconditioned.  He is only oriented to self and place.  No insight into why he is in the hospital.  Well-known history of noncompliance.  Cannot be home safely by himself.  Therapy recommended SNF.   Goal of care: DNR/DNI. -Palliative following.  Severe protein calorie malnutrition in the setting of alcohol abuse and chronic disease Body mass index is 30.16 kg/m. Nutrition Problem: Severe Malnutrition Etiology: chronic illness (alcoholic cirrhosis, hepatocellular carcinoma, ESRD) Signs/Symptoms: severe fat depletion, severe muscle depletion Interventions: MVI, Tube feeding    DVT prophylaxis:  heparin injection 5,000 Units Start: 04/19/22 2200 SCDs Start: 04/14/22 1640  Code Status: DNR/DNI Family Communication: None at bedside today. Level of care: Med-Surg Status is: Inpatient Remains inpatient appropriate because: SNF bed.     Final disposition: SNF Consultants:  Pulmonology admitted patient Nephrology Palliative medicine  Sch Meds:  Scheduled Meds:  amLODipine  10 mg Oral Daily   arformoterol  15 mcg Nebulization BID   budesonide (PULMICORT) nebulizer solution  0.25 mg Nebulization BID   calcium carbonate (dosed in mg elemental calcium)  500 mg of elemental calcium Oral BID   Chlorhexidine Gluconate Cloth  6 each Topical B0488    folic acid  1 mg Oral Daily   heparin injection (subcutaneous)  5,000 Units Subcutaneous Q8H   lactulose  10 g Oral QID   multivitamin  1 tablet Oral QHS   mouth rinse  15 mL Mouth Rinse 4 times per day   pantoprazole  40 mg Oral BID AC   revefenacin  175 mcg Nebulization Daily   Continuous Infusions:  anticoagulant sodium citrate     PRN Meds:.acetaminophen **OR** acetaminophen, albuterol, alteplase, anticoagulant sodium citrate, docusate sodium, guaiFENesin-dextromethorphan, haloperidol lactate, heparin, hydrALAZINE, lidocaine (PF), lidocaine-prilocaine, ondansetron **OR** ondansetron (ZOFRAN) IV, mouth rinse, pentafluoroprop-tetrafluoroeth, polyethylene glycol  Antimicrobials: Anti-infectives (From admission, onward)    Start     Dose/Rate Route Frequency Ordered Stop   04/18/22 1200  vancomycin (VANCOCIN) IVPB 1000 mg/200 mL premix  Status:  Discontinued        1,000 mg 200 mL/hr over 60 Minutes Intravenous Every M-W-F (Hemodialysis) 04/16/22 1428 04/17/22 0727   04/16/22 1000  remdesivir 100 mg in sodium chloride 0.9 % 100 mL IVPB       See Hyperspace for full Linked Orders Report.   100 mg 200 mL/hr over 30 Minutes Intravenous Daily 04/15/22 1244 04/17/22 1133   04/15/22 1445  vancomycin (VANCOREADY) IVPB 2000 mg/400 mL        2,000 mg 200 mL/hr over 120 Minutes Intravenous  Once 04/15/22 1354 04/15/22 1823   04/15/22 1354  vancomycin variable dose per unstable renal function (pharmacist dosing)  Status:  Discontinued         Does not apply See admin instructions 04/15/22 1354 04/17/22 1206   04/15/22 1330  remdesivir 200 mg in sodium chloride 0.9% 250 mL IVPB       See Hyperspace for full Linked Orders Report.   200 mg 580 mL/hr over 30 Minutes Intravenous Once 04/15/22 1244 04/15/22 1526   04/15/22 1330  cefTRIAXone (ROCEPHIN) 2 g in sodium chloride 0.9 % 100 mL IVPB  Status:  Discontinued        2 g 200 mL/hr over 30 Minutes Intravenous Every 24 hours 04/15/22 1244  04/22/22 1219   04/14/22 1445  cefTRIAXone (ROCEPHIN) 1 g in sodium chloride 0.9 % 100 mL IVPB        1 g 200 mL/hr over 30 Minutes Intravenous  Once 04/14/22 1438 04/14/22 1540        I have personally reviewed the following labs and images: CBC: Recent Labs  Lab 04/28/22 2210 04/30/22 1009 05/02/22 0752  WBC 6.6 8.1 6.4  HGB 10.8* 10.1* 9.8*  HCT 30.7* 29.8* 29.5*  MCV 94.2 96.1 98.7  PLT 117* 111* 119*   BMP &GFR Recent Labs  Lab 04/28/22 2210 04/30/22 1009 05/02/22 0752  NA 133* 133* 139  K 4.3 4.3 5.0  CL 93* 95* 100  CO2 '25 23 27  '$ GLUCOSE 203* 185* 145*  BUN 52* 91* 88*  CREATININE 4.27* 6.51* 6.26*  CALCIUM 8.4* 8.8* 8.7*  PHOS 3.4 4.6 3.0   Estimated Creatinine Clearance: 14.6 mL/min (A) (by C-G formula based on SCr of 6.26 mg/dL (H)). Liver & Pancreas: Recent Labs  Lab 04/28/22 2210 04/30/22 1009 05/02/22 0752  ALBUMIN 1.9* 1.9* 1.9*   No results for input(s): "LIPASE", "AMYLASE" in the last 168 hours. No results for input(s): "AMMONIA" in the last 168 hours. Diabetic: No results for input(s): "HGBA1C" in the last 72 hours. Recent Labs  Lab 04/29/22 0623 04/29/22 1135 04/29/22 2028 04/30/22 0000 04/30/22 0635  GLUCAP 100* 109* 174* 129* 97   Cardiac Enzymes: No results for input(s): "CKTOTAL", "CKMB", "CKMBINDEX", "TROPONINI" in the last 168 hours. No results for input(s): "PROBNP" in the last 8760 hours. Coagulation Profile: No results for input(s): "INR", "PROTIME" in the last 168 hours. Thyroid Function Tests: No results for input(s): "TSH", "T4TOTAL", "FREET4", "T3FREE", "THYROIDAB" in the last 72 hours. Lipid Profile: No results for input(s): "CHOL", "HDL", "LDLCALC", "TRIG", "CHOLHDL", "LDLDIRECT" in the last 72 hours. Anemia Panel: No results for input(s): "VITAMINB12", "FOLATE", "FERRITIN", "TIBC", "IRON", "RETICCTPCT" in the last 72 hours. Urine analysis:    Component Value Date/Time   COLORURINE YELLOW 12/09/2021 0900    APPEARANCEUR CLEAR 12/09/2021 0900   LABSPEC 1.008 12/09/2021 0900   PHURINE 5.0 12/09/2021 0900   GLUCOSEU NEGATIVE 12/09/2021 0900   HGBUR MODERATE (A) 12/09/2021 0900   BILIRUBINUR NEGATIVE 12/09/2021 0900   KETONESUR NEGATIVE 12/09/2021 0900   PROTEINUR 100 (A) 12/09/2021 0900   UROBILINOGEN 0.2 12/31/2009 0746   NITRITE NEGATIVE 12/09/2021 0900   LEUKOCYTESUR NEGATIVE 12/09/2021 0900   Sepsis Labs: Invalid input(s): "PROCALCITONIN", "LACTICIDVEN"  Microbiology: No results found for this or any previous visit (from the past 240 hour(s)).  Radiology Studies: No results found.    Sabrin Dunlevy T. [place]  If 7PM-7AM, please contact night-coverage [email or web address] 05/04/2022, 1:09 PM

## 2022-05-04 NOTE — Progress Notes (Signed)
[name] Progress Note   OP Dialysis Orders:  MWF [place] 4h  400/A1.5    98.8kg   2K/2Ca bat   LUE AVF  Heparin none - Hectoral 62mg IV q HD - Appears to get IV iron with oncology, no ESA d/t cancer?  Assessment/Plan:  Acute hypoxic respiratory failure - following large volume emesis of blood and was intubated in the ED.  Self-extubated 04/17/22, on HFNC which has been titrated down. Now on room air.   ABLA - acute on chronic with evidence of GI bleed.  Known AVM's.  GI consulted. s/p octreotide and on PPI BID.  Transfuse prn. Hb improved.  Covid-19 Positive - s/p isolation per protocol - now off.  per pmd.  Per charting he tested positive for covid on 12/17  ESRD - Continue HD per MWF schedule - using AVF.   Tolerated  HD 1/5 with 2.5L net UF. No absolute indication for RRT and the patient appears to be  comfortable. Next HD 1/8.   Hypertension/volume  - CXR 12/23 diffuse bilat infiltrates. Not sure vol vs aspiration pna vs other.  optimize UF with HD   Anemia of CKD - Multiple transfusions this admission, Hb appears to have stabilized. Hb 9.8  Appears to get IV iron w/ oncology?   Metabolic bone disease -  Corrected calcium elevated thus VDRA was previously placed on hold - CCa still up on last check. Phosphorus is at goal. Changed calcium carbonate from TID to BID   Medical noncompliance - multiple admissions - ongoing issue  Hepatocellular carcinoma - s/p Y90 on 11/28/21.  Disposition - per primary team.  Per charting he is inpatient due to the need for SNF placement.  SW has reached his sister   Subjective:    Net UF 2.5L on 1/5. Denies f/c/n/v/sob. Has appetite.  Review of systems: Denies shortness of breath  Denies n/v; states is eating ok  Limited by ams   Objective Vitals:   05/04/22 0446 05/04/22 0537 05/04/22 0722 05/04/22 0811  BP:  130/87  (!) 150/82  Pulse:  98  95  Resp:  16  17  Temp:  98.3 F (36.8 C)  98.2 F (36.8 C)  TempSrc:  Oral  Oral   SpO2:  90% 93% 94%  Weight: 98.1 kg     Height:       Physical Exam    General adult male in bed in no acute distress Lungs clear to auscultation bilaterally normal work of breathing at rest on room air  Heart S1S2 no rub Abdomen soft nontender distended Extremities no edema; left BKA  Psych no anxiety or agitation  Neuro alert and oriented to person; Access LUE AVF bruit and thrill    Additional Objective Labs: Basic Metabolic Panel: Recent Labs  Lab 04/28/22 2210 04/30/22 1009 05/02/22 0752  NA 133* 133* 139  K 4.3 4.3 5.0  CL 93* 95* 100  CO2 '25 23 27  '$ GLUCOSE 203* 185* 145*  BUN 52* 91* 88*  CREATININE 4.27* 6.51* 6.26*  CALCIUM 8.4* 8.8* 8.7*  PHOS 3.4 4.6 3.0   Liver Function Tests: Recent Labs  Lab 04/28/22 2210 04/30/22 1009 05/02/22 0752  ALBUMIN 1.9* 1.9* 1.9*   No results for input(s): "LIPASE", "AMYLASE" in the last 168 hours. CBC: Recent Labs  Lab 04/27/22 1252 04/28/22 2210 04/30/22 1009 05/02/22 0752  WBC 7.9 6.6 8.1 6.4  NEUTROABS 6.8  --   --   --   HGB 9.9* 10.8* 10.1*  9.8*  HCT 28.7* 30.7* 29.8* 29.5*  MCV 96.3 94.2 96.1 98.7  PLT 106* 117* 111* 119*   Blood Culture    Component Value Date/Time   SDES TRACHEAL ASPIRATE 04/15/2022 1622   SPECREQUEST NONE 04/15/2022 1622   CULT  04/15/2022 1622    RARE Normal respiratory flora-no Staph aureus or Pseudomonas seen Performed at [facility] [address], [place],  [address]    REPTSTATUS 04/18/2022 FINAL 04/15/2022 1622    Cardiac Enzymes: No results for input(s): "CKTOTAL", "CKMB", "CKMBINDEX", "TROPONINI" in the last 168 hours. CBG: Recent Labs  Lab 04/29/22 0623 04/29/22 1135 04/29/22 2028 04/30/22 0000 04/30/22 0635  GLUCAP 100* 109* 174* 129* 97   Iron Studies: No results for input(s): "IRON", "TIBC", "TRANSFERRIN", "FERRITIN" in the last 72 hours. '@lablastinr3'$ @ Studies/Results: No results found. Medications:  anticoagulant sodium citrate       amLODipine  10 mg Oral Daily   arformoterol  15 mcg Nebulization BID   budesonide (PULMICORT) nebulizer solution  0.25 mg Nebulization BID   calcium carbonate (dosed in mg elemental calcium)  500 mg of elemental calcium Oral BID   Chlorhexidine Gluconate Cloth  6 each Topical [id number]   folic acid  1 mg Oral Daily   heparin injection (subcutaneous)  5,000 Units Subcutaneous Q8H   lactulose  10 g Oral QID   multivitamin  1 tablet Oral QHS   mouth rinse  15 mL Mouth Rinse 4 times per day   pantoprazole  40 mg Oral BID AC   revefenacin  175 mcg Nebulization Daily      Lateesha Bezold, Hunt Oris, MD 05/04/2022, 8:49 AM

## 2022-05-05 DIAGNOSIS — F1010 Alcohol abuse, uncomplicated: Secondary | ICD-10-CM | POA: Diagnosis not present

## 2022-05-05 DIAGNOSIS — G9340 Encephalopathy, unspecified: Secondary | ICD-10-CM | POA: Diagnosis not present

## 2022-05-05 DIAGNOSIS — D62 Acute posthemorrhagic anemia: Secondary | ICD-10-CM | POA: Diagnosis not present

## 2022-05-05 DIAGNOSIS — K920 Hematemesis: Secondary | ICD-10-CM | POA: Diagnosis not present

## 2022-05-05 MED ORDER — ALBUTEROL SULFATE HFA 108 (90 BASE) MCG/ACT IN AERS: 2.0000 | INHALATION_SPRAY | Freq: Four times a day (QID) | RESPIRATORY_TRACT | 2 refills | Status: AC | PRN

## 2022-05-05 MED ORDER — AMLODIPINE BESYLATE 10 MG PO TABS: 10.0000 mg | ORAL_TABLET | Freq: Every day | ORAL | Status: AC

## 2022-05-05 MED ORDER — DULERA 200-5 MCG/ACT IN AERO: 2.0000 | INHALATION_SPRAY | Freq: Two times a day (BID) | RESPIRATORY_TRACT | Status: DC

## 2022-05-05 MED ORDER — ATORVASTATIN CALCIUM 10 MG PO TABS: 10.0000 mg | ORAL_TABLET | Freq: Every day | ORAL | 2 refills | Status: AC

## 2022-05-05 MED ORDER — LACTULOSE 10 GM/15ML PO SOLN: 20.0000 g | Freq: Three times a day (TID) | ORAL | 0 refills | Status: AC | PRN

## 2022-05-05 MED ORDER — ACETAMINOPHEN 325 MG PO TABS: 650.0000 mg | ORAL_TABLET | Freq: Four times a day (QID) | ORAL | Status: AC

## 2022-05-05 MED ORDER — RENA-VITE PO TABS: 1.0000 | ORAL_TABLET | Freq: Every day | ORAL | 0 refills | Status: AC

## 2022-05-05 MED ORDER — RENA-VITE PO TABS: 1.0000 | ORAL_TABLET | Freq: Every day | ORAL | 0 refills | Status: DC

## 2022-05-05 MED ORDER — FOLIC ACID 1 MG PO TABS: 1.0000 mg | ORAL_TABLET | Freq: Every day | ORAL | Status: AC

## 2022-05-05 NOTE — Progress Notes (Signed)
Physical Therapy Treatment Patient Details Name: Daniel [name] MRN: [id number] DOB: 1960/10/02 Today's Date: 05/05/2022   History of Present Illness Pt is a 62 y/o male presenting on 04/14/22 with symptomatic anemia. Noted pt missing multiple dialysis sessions due to transportation issues.  Found covid +. Admitted with GIB. ETT 12/18-12/21, acute hypoxic respiratory failure. 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    PT Comments    Pt progressing towards his physical therapy goals; cognition remains fairly impaired, however, pt is pleasant and agreeable to therapy session. Session focused on dynamic sitting balance, transfer training and functional strengthening. Pt able to perform ADL tasks edge of bed I.e. washing face and brushing teeth with set up assist. Requiring moderate assist (+2 safety) for serial sit to stands from edge of bed. Deferred transfer to chair due to pending HD. Continue to recommend SNF for ongoing Physical Therapy.      Recommendations for follow up therapy are one component of a multi-disciplinary discharge planning process, led by the attending physician.  Recommendations may be updated based on patient status, additional functional criteria and insurance authorization.  Follow Up Recommendations  Skilled nursing-short term rehab (<3 hours/day) Can patient physically be transported by private vehicle: No   Assistance Recommended at Discharge Frequent or constant Supervision/Assistance  Patient can return home with the following A lot of help with bathing/dressing/bathroom;Assistance with cooking/housework;Direct supervision/assist for medications management;Direct supervision/assist for financial management;Assist for transportation;Help with stairs or ramp for entrance;A lot of help with walking and/or transfers   Equipment Recommendations  None recommended by PT    Recommendations for Other  Services       Precautions / Restrictions Precautions Precautions: Fall Precaution Comments: h/o 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 Min guard for return to supine    Transfers Overall transfer level: Needs assistance Equipment used: Rolling walker (2 wheels) Transfers: Sit to/from Stand Sit to Stand: Mod assist, +2 safety/equipment           General transfer comment: ModA to rise from edge of bed x 3, pt pulling up on RW.    Ambulation/Gait                   Stairs             Wheelchair Mobility    Modified Rankin (Stroke Patients Only)       Balance Overall balance assessment: Needs assistance Sitting-balance support: No upper extremity supported, Feet supported Sitting balance-Leahy Scale: Good Sitting balance - Comments: Performing ADL tasks i.e. brushing teeth and washing face without LOB   Standing balance support: Bilateral upper extremity supported Standing balance-Leahy Scale: Poor Standing balance comment: reliant on RW                            Cognition Arousal/Alertness: Awake/alert Behavior During Therapy: Flat affect Overall Cognitive Status: No family/caregiver present to determine baseline cognitive functioning Area of Impairment: Orientation, Attention, Memory, Following commands, Safety/judgement, Awareness, Problem solving                 Orientation  Level: Disoriented to, Place, Time, Situation Current Attention Level: Selective Memory: Decreased short-term memory, Decreased recall of precautions Following Commands: Follows one step commands with increased time Safety/Judgement: Decreased awareness of  safety, Decreased awareness of deficits Awareness: Intellectual Problem Solving: Slow processing, Decreased initiation, Difficulty sequencing, Requires verbal cues, Requires tactile cues General Comments: Pt oriented to self only; smiling and joking with therapists. Could not recall what he ate for breakfast. Completing ADL tasks with increased time        Exercises      General Comments        Pertinent Vitals/Pain Pain Assessment Pain Assessment: Faces Faces Pain Scale: No hurt    Home Living                          Prior Function            PT Goals (current goals can now be found in the care plan section) Acute Rehab PT Goals Patient Stated Goal: did not state Potential to Achieve Goals: Good Progress towards PT goals: Progressing toward goals    Frequency    Min 2X/week      PT Plan Current plan remains appropriate    Co-evaluation              AM-PAC PT "6 Clicks" Mobility   Outcome Measure  Help needed turning from your back to your side while in a flat bed without using bedrails?: A Little Help needed moving from lying on your back to sitting on the side of a flat bed without using bedrails?: A Lot Help needed moving to and from a bed to a chair (including a wheelchair)?: A Lot Help needed standing up from a chair using your arms (e.g., wheelchair or bedside chair)?: A Lot Help needed to walk in hospital room?: Total Help needed climbing 3-5 steps with a railing? : Total 6 Click Score: 11    End of Session Equipment Utilized During Treatment: Gait belt Activity Tolerance: Patient tolerated treatment well Patient left: in bed;with call bell/phone within reach;with bed alarm set Nurse Communication: Mobility status PT Visit Diagnosis: Other abnormalities of gait and mobility (R26.89);Muscle weakness (generalized) (M62.81);Difficulty in walking, not elsewhere classified (R26.2)     Time: 1660-6004 PT Time Calculation (min) (ACUTE  ONLY): 24 min  Charges:  $Therapeutic Activity: 23-37 mins                     Wyona Almas, PT, DPT Acute Rehabilitation Services Office [phone]    Deno Etienne 05/05/2022, 10:03 AM

## 2022-05-05 NOTE — Progress Notes (Signed)
Pt is transferred to SNF by [facility] at 09:05 PM. Vital signs stable. No distress is noted prior transferring.. Pt's belongings and AVS are given to [facility] staff.   Kennyth Lose, RN

## 2022-05-05 NOTE — Procedures (Signed)
HD Note:  Some information was entered later than the data was gathered due to patient care needs. The stated time with the data is accurate.  Received patient in bed to unit.    Informed consent signed and in chart.   Patient was alert and restless, unable to maintain information to keep his access arm straight and not remove his monitoring devices.  Dr. Jonnie Finner was bedside and asked that his access arm be restrained to allow him to complete treatment.  Patient did not object when the restraint was applied.  The restraint was removed as soon as treatment was completed   Transported back to the room  Alert, without acute distress.  Hand-off given to patient's nurse.   Access used: Left arm AVF Access issues: None  Total UF removed: 2000 ml    [facility]

## 2022-05-05 NOTE — Discharge Summary (Signed)
Physician Discharge Summary  Daniel [name] HWE:[id number] DOB: Jun 05, 1960 DOA: 04/14/2022  PCP: [name], NP  Admit date: 04/14/2022 Discharge date: 05/06/2022 Admitted From: Home. Disposition: SNF Recommendations for Outpatient Follow-up:   Check CBC and CMP in 1 week Consider outpatient follow-up with gastroenterology for alcoholic liver cirrhosis Please follow up on the following pending results: None   Discharge Condition: Stable CODE STATUS: DNR/DNI  Contact information for follow-up providers     [place] Follow up.   Specialty: Home Health Services Why: Physical and Occupational Tehrapy, Registered Nurse-office to call with visit times. Contact information: [address] [place] [address] [phone]              Contact information for after-discharge care     Destination     [facility] Preferred SNF .   Service: Skilled Nursing Contact information: [address] [place] [facility] course 62 year old M with PMH of PAD s/p left BKA, ESRD on HD MWF, DM-2, EtOH cirrhosis, HCC, GI bleed, duodenal ulcer and AVM returning with hematemesis on 12/18 after he left AMA on 12/17.  Tested positive for COVID on 12/17 after he presented with generalized body symptoms and left AMA.  He returned on 12/18 with hematemesis and intubated.  He self extubated on 12/21.  CODE STATUS changed to DNR/DNI on 12/24.  He was transfused 2 units of blood on 04/24/2022.  Eventually, he was stabilized and transferred to hospitalist service.    Hospital course complicated by delirium.  Respiratory failure resolved.   Therapy recommended SNF.  See individual problem list below for more.   Problems addressed during this hospitalization Principal Problem:   GIB (gastrointestinal bleeding) Active Problems:   ABLA (acute blood loss anemia)    Alcohol abuse   ESRD (end stage renal disease) (HCC)   Acute encephalopathy   Thrombocytopenia (HCC)   Peptic ulcer disease   Diabetes mellitus type 2, controlled, with complications ([place])   Physical deconditioning   Iron deficiency anemia due to chronic blood loss   Acute on chronic anemia   Cirrhosis of liver with ascites (HCC)   Symptomatic anemia   Pneumonia due to COVID-19 virus   Protein-calorie malnutrition, severe  Acute blood loss anemia due to upper GI bleed/hematemesis: Has EtOH cirrhosis, duodenal ulcer, AVM, portal gastropathy and G1 EV-H&H stable since she received 2 units on 12/28. Recent Labs (within last 365 days)              Recent Labs    04/21/22 0658 04/22/22 0620 04/23/22 0520 04/24/22 0332 04/25/22 1455 04/26/22 0113 04/27/22 1252 04/28/22 2210 04/30/22 1009 05/02/22 0752  HGB 7.3* 7.1* 6.7* 8.6* 9.5* 10.2* 9.9* 10.8* 10.1* 9.8*    -Continue PPI -Encouraged alcohol cessation. -Check CBC in 1 to 2 weeks   Acute respiratory failure with hypoxia in the setting of COVID-19 infection, pneumonia and pulmonary edema: Intubated on 12/18 and self extubated on 12/21.  Currently on room air, DNR and DNI. -Completed 7 days of ceftriaxone.  Briefly received vancomycin as well. -Completed steroid course for COVID-19. -Albuterol as needed   ESRD on iHD MWF, poorly compliant as an outpatient Hyponatremia, hyperkalemia, hyperphosphatemia -Nephrology managing.   Thrombocytopenia-chronic due to cirrhosis: Improved. -Check CBC in one to two weeks   History of EtOH cirrhosis/HCC -Recommend nonemergent  outpatient follow-up with GI   Uncontrolled hypertension: BP elevated. -Increased amlodipine to 10 mg daily. -Fluid management by HD.   Acute metabolic encephalopathy/delirium: Should be outside withdrawal window for alcohol by now.  Oriented to self and place but limited insight.  Improved. -Reorientation and delirium precaution.   Controlled NIDDM-2: A1c  5.1%.  CBG fairly controlled. -Discontinued CBG monitoring and SSI insulin   Physical deconditioning/left BKA: Patient lives alone prior to hospitalization.  He has a left BKA and physically deconditioned.  He is only oriented to self and place.  No insight into why he is in the hospital.  Well-known history of noncompliance.  Cannot be home safely by himself.  Therapy recommended SNF.     Goal of care: DNR/DNI.    Severe protein calorie malnutrition Nutrition Problem: Severe Malnutrition Etiology: chronic illness (alcoholic cirrhosis, hepatocellular carcinoma, ESRD) Signs/Symptoms: severe fat depletion, severe muscle depletion Interventions: MVI, Tube feeding  Pressure Injury 07/04/21 Buttocks Medial Stage 2 -  Partial thickness loss of dermis presenting as a shallow open injury with a red, pink wound bed without slough. two small areas of broken skin in between buttocks. (Active)  07/04/21 0936  Location: Buttocks  Location Orientation: Medial  Staging: Stage 2 -  Partial thickness loss of dermis presenting as a shallow open injury with a red, pink wound bed without slough.  Wound Description (Comments): two small areas of broken skin in between buttocks.  Present on Admission:   Dressing Type Foam - Lift dressing to assess site every shift 05/05/22 2043    Vital signs Vitals:   05/05/22 1600 05/05/22 1630 05/05/22 1713 05/05/22 2043  BP: 124/79 128/77 (!) 118/51 124/73  Pulse: (!) 50 95 92 93  Temp:   99 F (37.2 C) 97.8 F (36.6 C)  Resp: '17 20 17 18  '$ Height:      Weight:      SpO2: 90% 95% 94% 93%  TempSrc:    Oral  BMI (Calculated):         Discharge exam  GENERAL: No apparent distress.  Nontoxic. HEENT: MMM.  Vision and hearing grossly intact.  NECK: Supple.  No apparent JVD.  RESP:  No IWOB.  Fair aeration bilaterally. CVS:  RRR. Heart sounds normal.  ABD/GI/GU: BS+. Abd soft, NTND.  MSK/EXT:  Moves extremities.  Left BKA. SKIN: no apparent skin lesion or  wound NEURO: Awake and alert.  Oriented to self, place and month.  No apparent focal neuro deficit. PSYCH: Calm. Normal affect.   Discharge Instructions Discharge Instructions     Diet - low sodium heart healthy   Complete by: As directed    Increase activity slowly   Complete by: As directed    No wound care   Complete by: As directed       Allergies as of 05/05/2022       Reactions   Seroquel [quetiapine] Other (See Comments)   Tardive kinesia Dystonia    Dilaudid [hydromorphone Hcl] Itching, Other (See Comments)   Pt reports itchiness after IM injection         Medication List     STOP taking these medications    diclofenac Sodium 1 % Gel Commonly known as: VOLTAREN   gabapentin 300 MG capsule Commonly known as: NEURONTIN   loperamide 2 MG capsule Commonly known as: IMODIUM   nicotine 21 mg/24hr patch Commonly known as: NICODERM CQ - dosed in mg/24 hours       TAKE these medications  acetaminophen 325 MG tablet Commonly known as: TYLENOL Take 2 tablets (650 mg total) by mouth every 6 (six) hours.   albuterol 108 (90 Base) MCG/ACT inhaler Commonly known as: VENTOLIN HFA Inhale 2 puffs into the lungs every 6 (six) hours as needed for wheezing or shortness of breath.   amLODipine 10 MG tablet Commonly known as: NORVASC Take 1 tablet (10 mg total) by mouth daily. What changed:  medication strength how much to take   atorvastatin 10 MG tablet Commonly known as: LIPITOR Take 1 tablet (10 mg total) by mouth daily. What changed:  medication strength how much to take   calcium carbonate (dosed in mg elemental calcium) 1250 MG/5ML Susp Take 5 mLs (500 mg of elemental calcium total) by mouth every 6 (six) hours as needed for indigestion.   folic acid 1 MG tablet Commonly known as: FOLVITE Take 1 tablet (1 mg total) by mouth daily.   lactulose 10 GM/15ML solution Commonly known as: CHRONULAC Take 30 mLs (20 g total) by mouth 3 (three) times  daily as needed for mild constipation.   multivitamin Tabs tablet Take 1 tablet by mouth daily.   pantoprazole 40 MG tablet Commonly known as: PROTONIX Take 40 mg by mouth 2 (two) times daily before a meal.   traZODone 50 MG tablet Commonly known as: DESYREL Take 1 tablet (50 mg total) by mouth daily as needed for sleep.        Consultations: Pulmonology admitted patient. Nephrology Palliative medicine  Procedures/Studies: Intubation and mechanical ventilation from 12/18-12/21   DG Chest Port 1 View  Result Date: 04/24/2022 CLINICAL DATA:  Pneumonia. EXAM: PORTABLE CHEST 1 VIEW COMPARISON:  Radiographs 04/19/2022 and 04/14/2022.  CT 12/25/2021. FINDINGS: 0937 hours. Patient is rotated to the right. Allowing for this, the heart size and mediastinal contours are stable. The heart appears mildly enlarged. A feeding tube is in place, projecting below the diaphragm, tip not visualized. Diffuse bilateral airspace opacities demonstrate interval improvement on the right. There are probable small bilateral pleural effusions. No evidence of pneumothorax or acute osseous abnormality. Telemetry leads overlie the chest. IMPRESSION: Interval improvement in right lung aeration with persistent diffuse bilateral airspace opacities. Probable small bilateral pleural effusions. Electronically Signed   By: Richardean Sale M.D.   On: 04/24/2022 09:51   DG Chest Port 1 View  Result Date: 04/19/2022 CLINICAL DATA:  Respiratory failure.  COVID positive. EXAM: PORTABLE CHEST 1 VIEW COMPARISON:  04/14/2022 FINDINGS: Interval extubation. Right IJ central line is no longer evident. A feeding tube passes into the stomach although the distal tip position is not included on the film. Bilateral asymmetric airspace disease is similar to prior with confluent disease in the left mid lung and right base. The cardio pericardial silhouette is enlarged. Similar bibasilar collapse/consolidation with small bilateral pleural  effusions. The cardio pericardial silhouette is enlarged. Telemetry leads overlie the chest. IMPRESSION: 1. Interval extubation and removal of right IJ central line. 2. Similar appearance of bilateral asymmetric airspace disease with small bilateral pleural effusions. Electronically Signed   By: Misty Stanley M.D.   On: 04/19/2022 10:51   DG Abd Portable 1V  Result Date: 04/18/2022 CLINICAL DATA:  Feeding tube placement EXAM: PORTABLE ABDOMEN - 1 VIEW COMPARISON:  04/14/2022 FINDINGS: Feeding tube with the tip projecting over the stomach. Mild gaseous distension of the small bowel and colon. No bowel dilatation to suggest bowel obstruction. No evidence of pneumoperitoneum, portal venous gas or pneumatosis. No pathologic calcifications along the expected course of  the ureters. No acute osseous abnormality. IMPRESSION: 1. Feeding tube with the tip projecting over the stomach. Electronically Signed   By: Kathreen Devoid M.D.   On: 04/18/2022 15:15   EEG adult  Result Date: 04/15/2022 Lora Havens, MD     04/15/2022  9:38 AM Patient Name: JASSON SIEGMANN MRN: [id number] Epilepsy Attending: Lora Havens Referring Physician/Provider: Cristal Generous, NP Date: 04/14/2022 Duration: 24.10 mins Patient history: 62 year old male with altered mental status.  EEG Drolet for seizure. Level of alertness: comatose AEDs during EEG study: Propofol Technical aspects: This EEG study was done with scalp electrodes positioned according to the 10-20 International system of electrode placement. Electrical activity was reviewed with band pass filter of 1-'70Hz'$ , sensitivity of 7 uV/mm, display speed of 73m/sec with a '60Hz'$  notched filter applied as appropriate. EEG data were recorded continuously and digitally stored.  Video monitoring was available and reviewed as appropriate. Description: EEG showed near continuous generalized 3 to 6 Hz theta-delta slowing.  Intermittent generalized 13 to 15 Hz beta activity was also  noted.  Hyperventilation and photic stimulation were not performed.   ABNORMALITY - Continuous slow, generalized IMPRESSION: This study is suggestive of moderate to severe diffuse encephalopathy, nonspecific etiology. No seizures or epileptiform discharges were seen throughout the recording. Priyanka OBarbra Sarks  CT HEAD WO CONTRAST (5MM)  Result Date: 04/15/2022 CLINICAL DATA:  Anisocoria EXAM: CT HEAD WITHOUT CONTRAST TECHNIQUE: Contiguous axial images were obtained from the base of the skull through the vertex without intravenous contrast. RADIATION DOSE REDUCTION: This exam was performed according to the departmental dose-optimization program which includes automated exposure control, adjustment of the mA and/or kV according to patient size and/or use of iterative reconstruction technique. COMPARISON:  CT head 03/29/2022, MRI head 01/05/2022 FINDINGS: Brain: Cerebral ventricle sizes are concordant with the degree of cerebral volume loss. Patchy and confluent areas of decreased attenuation are noted throughout the deep and periventricular white matter of the cerebral hemispheres bilaterally, compatible with chronic microvascular ischemic disease. No evidence of large-territorial acute infarction. No parenchymal hemorrhage. No mass lesion. No extra-axial collection. No mass effect or midline shift. No hydrocephalus. Basilar cisterns are patent. Vascular: No hyperdense vessel. Atherosclerotic calcifications are present within the cavernous internal carotid and vertebral arteries. Skull: No acute fracture or focal lesion. Sinuses/Orbits: Paranasal sinuses and mastoid air cells are clear. The orbits are unremarkable. Other: Endotracheal tube partially visualized. IMPRESSION: No acute intracranial abnormality. Electronically Signed   By: MIven FinnM.D.   On: 04/15/2022 02:17   DG Chest Portable 1 View  Result Date: 04/14/2022 CLINICAL DATA:  Intubated EXAM: PORTABLE CHEST 1 VIEW COMPARISON:  04/14/2022,  04/10/2022 FINDINGS: Interval intubation, tip of the endotracheal tube is about 2.5 cm superior to carina. Esophageal tube tip below the diaphragm but incompletely visualized. Right central venous catheter sheath over the distal jugular region. Two adjacent linear densities over the distal jugular vein/venous confluence distal to the sheath. No evidence for right pneumothorax. Cardiomegaly with pleural effusions and airspace disease as before. IMPRESSION: 1. Endotracheal tube tip about 2.5 cm superior to carina. 2. Cardiomegaly with pleural effusions and airspace disease as before. 3. Right central venous catheter sheath over the distal jugular region. Two adjacent linear densities over the distal jugular vein/venous confluence distal to the sheath. Uncertain if these are external to the patient or related to the jugular catheter, recommend repeat/follow-up chest radiograph to assess for persistence. Electronically Signed   By: KDonavan FoilM.D.   On:  04/14/2022 17:35   DG Abdomen 1 View  Result Date: 04/14/2022 CLINICAL DATA:  Orogastric tube placement. EXAM: ABDOMEN - 1 VIEW COMPARISON:  January 24, 2022. FINDINGS: Distal tip of nasogastric tube is seen in expected position of the body of the stomach. IMPRESSION: Distal tip of nasogastric tube is seen in expected position of the stomach. Electronically Signed   By: Marijo Conception M.D.   On: 04/14/2022 17:31   DG Chest Port 1 View  Result Date: 04/14/2022 CLINICAL DATA:  Cough, COVID-19 positive. EXAM: PORTABLE CHEST 1 VIEW COMPARISON:  April 10, 2022. FINDINGS: Stable cardiomegaly. Stable bilateral upper lobe airspace opacities are noted, left greater than right, consistent with pneumonia. Stable bibasilar atelectasis or infiltrates are noted with associated pleural effusions. Bony thorax is unremarkable. IMPRESSION: Stable bilateral lung opacities and pleural effusions as described above. Electronically Signed   By: Marijo Conception M.D.   On:  04/14/2022 13:25   CT Renal Stone Study  Result Date: 04/11/2022 CLINICAL DATA:  Abdominal and flank pain.  Stone suspected. EXAM: CT ABDOMEN AND PELVIS WITHOUT CONTRAST TECHNIQUE: Multidetector CT imaging of the abdomen and pelvis was performed following the standard protocol without IV contrast. RADIATION DOSE REDUCTION: This exam was performed according to the departmental dose-optimization program which includes automated exposure control, adjustment of the mA and/or kV according to patient size and/or use of iterative reconstruction technique. COMPARISON:  02/15/2022 FINDINGS: Lower chest: Motion artifact limits examination. Moderate bilateral pleural effusions with atelectasis or consolidation in the lung bases. Small pericardial effusion. Coronary artery and aortic calcification. Hepatobiliary: Cirrhotic changes in the liver with nodular contour and enlarged lateral segment left lobe. No focal lesions identified although limited parenchymal visualization is available on noncontrast imaging. Cholelithiasis with a stone in the gallbladder. No gallbladder wall thickening. No bile duct dilatation. Pancreas: Unremarkable. No pancreatic ductal dilatation or surrounding inflammatory changes. Spleen: Normal in size without focal abnormality. Adrenals/Urinary Tract: No adrenal gland nodule. Stone in the lower pole left kidney measuring 3 mm diameter. No hydronephrosis or hydroureter. No ureteral stones. Bladder is normal. Stomach/Bowel: Stomach, small bowel, and colon are not abnormally distended. No wall thickening or inflammatory changes. Appendix is not identified. Vascular/Lymphatic: Diffuse calcification of the aorta and major abdominal branch vessels. No significant lymphadenopathy. Reproductive: Prostate is unremarkable. Other: No free air or free fluid in the abdomen. Previously identified ascites has resolved. Musculoskeletal: Degenerative changes in the spine and hips. No acute bony abnormalities.  Infiltration in the subcutaneous fat most prominent on the bilateral flank regions. This is likely edema but could indicate contusions. IMPRESSION: 1. Nonobstructing stone in the left kidney. No ureteral stone or obstruction. 2. Hepatic cirrhosis.  Interval decrease of previous ascites. 3. Moderate bilateral pleural effusions with basilar atelectasis or consolidation in the lungs. 4. Diffuse aortic atherosclerosis. 5. Infiltration of subcutaneous fat is likely edematous. 6. Cholelithiasis without evidence of acute cholecystitis. Electronically Signed   By: Lucienne Capers M.D.   On: 04/11/2022 02:36   DG Chest 2 View  Result Date: 04/10/2022 CLINICAL DATA:  Shortness of breath EXAM: CHEST - 2 VIEW COMPARISON:  04/06/2022 FINDINGS: Cardiac shadow is enlarged. Aortic calcifications are seen. Bilateral pleural effusions are noted. Patchy airspace opacity is noted in both lungs primarily in the bases. No pneumothorax is seen. No bony abnormality is noted. IMPRESSION: Bilateral effusions and airspace opacities stable from the prior study. Electronically Signed   By: Inez Catalina M.D.   On: 04/10/2022 03:56  The results of significant diagnostics from this hospitalization (including imaging, microbiology, ancillary and laboratory) are listed below for reference.     Microbiology: No results found for this or any previous visit (from the past 240 hour(s)).   Labs:  CBC: Recent Labs  Lab 04/30/22 1009 05/02/22 0752  WBC 8.1 6.4  HGB 10.1* 9.8*  HCT 29.8* 29.5*  MCV 96.1 98.7  PLT 111* 119*   BMP &GFR Recent Labs  Lab 04/30/22 1009 05/02/22 0752  NA 133* 139  K 4.3 5.0  CL 95* 100  CO2 23 27  GLUCOSE 185* 145*  BUN 91* 88*  CREATININE 6.51* 6.26*  CALCIUM 8.8* 8.7*  PHOS 4.6 3.0   Estimated Creatinine Clearance: 14.4 mL/min (A) (by C-G formula based on SCr of 6.26 mg/dL (H)). Liver & Pancreas: Recent Labs  Lab 04/30/22 1009 05/02/22 0752  ALBUMIN 1.9* 1.9*   No  results for input(s): "LIPASE", "AMYLASE" in the last 168 hours. No results for input(s): "AMMONIA" in the last 168 hours. Diabetic: No results for input(s): "HGBA1C" in the last 72 hours. Recent Labs  Lab 04/29/22 2028 04/30/22 0000 04/30/22 0635  GLUCAP 174* 129* 97   Cardiac Enzymes: No results for input(s): "CKTOTAL", "CKMB", "CKMBINDEX", "TROPONINI" in the last 168 hours. No results for input(s): "PROBNP" in the last 8760 hours. Coagulation Profile: No results for input(s): "INR", "PROTIME" in the last 168 hours. Thyroid Function Tests: No results for input(s): "TSH", "T4TOTAL", "FREET4", "T3FREE", "THYROIDAB" in the last 72 hours. Lipid Profile: No results for input(s): "CHOL", "HDL", "LDLCALC", "TRIG", "CHOLHDL", "LDLDIRECT" in the last 72 hours. Anemia Panel: No results for input(s): "VITAMINB12", "FOLATE", "FERRITIN", "TIBC", "IRON", "RETICCTPCT" in the last 72 hours. Urine analysis:    Component Value Date/Time   COLORURINE YELLOW 12/09/2021 0900   APPEARANCEUR CLEAR 12/09/2021 0900   LABSPEC 1.008 12/09/2021 0900   PHURINE 5.0 12/09/2021 0900   GLUCOSEU NEGATIVE 12/09/2021 0900   HGBUR MODERATE (A) 12/09/2021 0900   BILIRUBINUR NEGATIVE 12/09/2021 0900   KETONESUR NEGATIVE 12/09/2021 0900   PROTEINUR 100 (A) 12/09/2021 0900   UROBILINOGEN 0.2 12/31/2009 0746   NITRITE NEGATIVE 12/09/2021 0900   LEUKOCYTESUR NEGATIVE 12/09/2021 0900   Sepsis Labs: Invalid input(s): "PROCALCITONIN", "LACTICIDVEN"   SIGNED:  Mercy Riding, MD  [facility] 05/06/2022, 3:29 PM

## 2022-05-05 NOTE — Care Management Important Message (Signed)
Important Message  Patient Details  Name: Daniel [name] MRN: [id number] Date of Birth: Nov 29, 1960   Medicare Important Message Given:  Yes     Shelda Altes 05/05/2022, 9:16 AM

## 2022-05-05 NOTE — Progress Notes (Signed)
Contacted by CSW this morning. Pt to d/c to snf today. Contacted [facility] to advise clinic that pt will d/c to snf today and should resume on Wednesday. Clinic advised of snf name and that snf informed CSW that snf will arrange transportation to/from HD clinic.   Melven Sartorius Renal Navigator [phone]

## 2022-05-05 NOTE — TOC Transition Note (Signed)
Transition of Care [facility]) - CM/SW Discharge Note   Patient Details  Name: Daniel [name] MRN: [id number] Date of Birth: August 03, 1960  Transition of Care [facility]) CM/SW Contact:  Bethann Berkshire, [place] Phone Number: 05/05/2022, 4:19 PM   Clinical Narrative:     Patient will DC to: [place] [facility] Anticipated DC date: 05/05/22 Family notified:   Reeves,[name] (Sister) [phone] (Mobile)   Transport by: Corey Harold   Per MD patient ready for DC to [facility]. RN, patient, patient's family, and facility notified of DC. Discharge Summary and FL2 sent to facility. RN to call report prior to discharge ([phone] Room 124b). DC packet on chart. Ambulance transport scheduled for 730pm  CSW will sign off for now as social work intervention is no longer needed. Please consult [place] again if new needs arise.   Final next level of care: Skilled Nursing Facility Barriers to Discharge: No Barriers Identified   Patient Goals and CMS Choice   Choice offered to / list presented to : Sibling  Discharge Placement                Patient chooses bed at: [facility] Patient to be transferred to facility by: [place] Name of family member notified: Reeves,[name] (Sister)  [phone] [facility]) Patient and family notified of of transfer: 05/05/22  Discharge Plan and Services Additional resources added to the After Visit Summary for   In-house Referral: Clinical Social Work Discharge Planning Services: CM Consult Post Acute Care Choice: [place]          DME Arranged: N/A         HH Arranged: RN, Disease Management, PT, OT HH Agency: [facility] Date [place]: 04/15/22 Time [place]: [id number] Representative spoke with at [place]: Kerry Dory.  Social Determinants of Health ([place]) Interventions SDOH Screenings   Food Insecurity: No Food Insecurity (04/06/2022)  Housing: Low Risk  (04/06/2022)  Recent Concern: Housing - High Risk  (03/25/2022)  Transportation Needs: No Transportation Needs (04/06/2022)  Recent Concern: Transportation Needs - Unmet Transportation Needs (03/04/2022)  Utilities: Not At Risk (04/06/2022)  Alcohol Screen: Low Risk  (11/01/2021)  Depression (PHQ2-9): Low Risk  (12/31/2021)  Financial Resource Strain: High Risk (11/01/2021)  Physical Activity: Insufficiently Active (11/01/2021)  Social Connections: Socially Isolated (11/01/2021)  Stress: No Stress Concern Present (11/01/2021)  Tobacco Use: High Risk (04/14/2022)     Readmission Risk Interventions    05/01/2022    5:04 PM 10/10/2021   10:49 AM 02/27/2021   12:51 PM  Readmission Risk Prevention Plan  Transportation Screening Complete Complete Complete  Medication Review Press photographer) Complete Complete Complete  PCP or Specialist appointment within 3-5 days of discharge Complete Complete Complete  HRI or Home Care Consult Complete Complete Complete  SW Recovery Care/Counseling Consult Complete Complete Complete  Palliative Care Screening Complete Complete Not Applicable  [place] Complete Patient Refused Not Applicable

## 2022-05-05 NOTE — Progress Notes (Signed)
[facility] Progress Note   OP Dialysis Orders:  MWF [place] 4h  400/A1.5    98.8kg   2K/2Ca bat   LUE AVF  Heparin none - Hectoral 31mg IV q HD - Appears to get IV iron with oncology, no ESA d/t cancer?  Assessment/Plan:  Acute hypoxic respiratory failure - following large volume emesis of blood and was intubated in the ED.  Self-extubated 04/17/22, on HFNC which has been titrated down. Now on room air.   ABLA - acute on chronic with evidence of GI bleed.  Known AVM's.  GI consulted. s/p octreotide and on PPI BID.  Transfuse prn. Hb improved.  Covid-19 Positive - s/p isolation per protocol - now off.  per pmd.  Per charting he tested positive for covid on 12/17  ESRD - Continue HD per MWF schedule - using AVF. HD today  Hypertension/volume  - CXR 12/23 diffuse bilat infiltrates. Not sure vol vs aspiration pna vs other.  optimize UF with HD. F/u CXR 12/28 showed improvement and now on RA.   Anemia of CKD - Multiple transfusions this admission, Hb appears to have stabilized. Hb 9.8  Appears to get IV iron w/ oncology?   Metabolic bone disease -  Corrected calcium elevated thus VDRA was previously placed on hold - CCa still up on last check. Phosphorus is at goal. Changed calcium carbonate from TID to BID   Medical noncompliance - multiple admissions - ongoing issue  Hepatocellular carcinoma - s/p Y90 on 11/28/21.  Disposition - per primary team.  Per charting he is inpatient due to the need for SNF placement.  SW has reached his sister   Subjective:    Seen in HD unit, no c/o's today. Still a bit confused and lethargic.    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 CBC: Recent Labs  Lab 04/28/22 2210 04/30/22 1009 05/02/22 0752  WBC 6.6 8.1 6.4  HGB 10.8* 10.1* 9.8*  HCT 30.7* 29.8* 29.5*  MCV 94.2 96.1 98.7  PLT 117* 111* 119*    Blood Culture    Component Value Date/Time   SDES TRACHEAL ASPIRATE 04/15/2022 1622   SPECREQUEST NONE 04/15/2022 1622   CULT  04/15/2022 1622  RARE Normal respiratory flora-no Staph aureus or Pseudomonas seen Performed at [facility] [address] [place], [place] [address]    REPTSTATUS 04/18/2022 FINAL 04/15/2022 1622    Cardiac Enzymes: No results for input(s): "CKTOTAL", "CKMB", "CKMBINDEX", "TROPONINI" in the last 168 hours. CBG: Recent Labs  Lab 04/29/22 0623 04/29/22 1135 04/29/22 2028 04/30/22 0000 04/30/22 0635  GLUCAP 100* 109* 174* 129* 97    Iron Studies: No results for input(s): "IRON", "TIBC", "TRANSFERRIN", "FERRITIN" in the last 72 hours. '@lablastinr3'$ @ Studies/Results: No results found. Medications:  anticoagulant sodium citrate      acetaminophen  650 mg Oral Q6H WA   amLODipine  10 mg Oral Daily   calcium carbonate (dosed in mg elemental calcium)  500 mg of elemental calcium Oral BID   Chlorhexidine Gluconate Cloth  6 each Topical K2409   folic acid  1 mg Oral  Daily   heparin injection (subcutaneous)  5,000 Units Subcutaneous Q8H   lactulose  10 g Oral QID   multivitamin  1 tablet Oral QHS   mouth rinse  15 mL Mouth Rinse 4 times per day   pantoprazole  40 mg Oral BID AC      Sol Blazing, MD 05/05/2022, 5:06 PM

## 2022-05-08 NOTE — Patient Outreach (Signed)
Daniel [name] 1961-03-08 [id number]  Follow up:  Transitioned to SNF  Updated patient's review reveals that the patient transitioned to [facility], reviewed for post hospital ongoing community care coordination needs as patient was active with [facility] NP-G.   Plan: Updated [facility] NP and [facility] RN PAC of transition and ongoing follow up needs.  For questions,  Natividad Brood, RN BSN [place]  [phone] business mobile phone Toll free office [phone]  *[place]  [phone] Fax number: [phone] [email or web address]

## 2022-05-09 ENCOUNTER — Other Ambulatory Visit: Payer: Self-pay | Admitting: *Deleted

## 2022-05-09 NOTE — Patient Outreach (Signed)
Late entry for 05/07/22 Mr. Kerrick resides in [facility] skilled nursing facility. Screening for [facility] care coordination services as benefit of insurance plan and Primary Care Provider.  [facility] Care Management NP has been active.  Facility site visit to [facility]. Met with social work team. Mr. Mielke is from home alone. States Mr. Blyden sister Verdene Lennert is involved in discharge planning. Anticipated transition plan is for long term care. Mr. Fandino was out of facility during writer's visit. Goes to HD on M, W, F.  Will continue to follow. Will send update to [facility] NP.   Marthenia Rolling, MSN, RN,BSN [place] Acute Care Coordinator [phone] (Direct dial)

## 2022-05-12 ENCOUNTER — Inpatient Hospital Stay: Payer: Medicare Other | Admitting: Hematology

## 2022-05-12 ENCOUNTER — Inpatient Hospital Stay: Payer: Medicare Other

## 2022-05-14 ENCOUNTER — Other Ambulatory Visit: Payer: Self-pay

## 2022-05-14 ENCOUNTER — Encounter (HOSPITAL_COMMUNITY): Payer: Self-pay | Admitting: Internal Medicine

## 2022-05-14 ENCOUNTER — Emergency Department (HOSPITAL_COMMUNITY): Payer: Medicare Other

## 2022-05-14 ENCOUNTER — Inpatient Hospital Stay (HOSPITAL_COMMUNITY)
Admission: EM | Admit: 2022-05-14 | Discharge: 2022-05-29 | DRG: 640 | Disposition: E | Payer: Medicare Other | Source: Ambulatory Visit | Attending: Internal Medicine | Admitting: Internal Medicine

## 2022-05-14 DIAGNOSIS — F1410 Cocaine abuse, uncomplicated: Secondary | ICD-10-CM | POA: Diagnosis present

## 2022-05-14 DIAGNOSIS — D509 Iron deficiency anemia, unspecified: Secondary | ICD-10-CM | POA: Diagnosis present

## 2022-05-14 DIAGNOSIS — F17200 Nicotine dependence, unspecified, uncomplicated: Secondary | ICD-10-CM | POA: Diagnosis present

## 2022-05-14 DIAGNOSIS — Z1152 Encounter for screening for COVID-19: Secondary | ICD-10-CM | POA: Diagnosis not present

## 2022-05-14 DIAGNOSIS — J9601 Acute respiratory failure with hypoxia: Secondary | ICD-10-CM

## 2022-05-14 DIAGNOSIS — I132 Hypertensive heart and chronic kidney disease with heart failure and with stage 5 chronic kidney disease, or end stage renal disease: Secondary | ICD-10-CM | POA: Diagnosis present

## 2022-05-14 DIAGNOSIS — Z79899 Other long term (current) drug therapy: Secondary | ICD-10-CM

## 2022-05-14 DIAGNOSIS — K766 Portal hypertension: Secondary | ICD-10-CM | POA: Diagnosis present

## 2022-05-14 DIAGNOSIS — K31811 Angiodysplasia of stomach and duodenum with bleeding: Secondary | ICD-10-CM | POA: Diagnosis present

## 2022-05-14 DIAGNOSIS — Z992 Dependence on renal dialysis: Secondary | ICD-10-CM | POA: Diagnosis not present

## 2022-05-14 DIAGNOSIS — E8779 Other fluid overload: Secondary | ICD-10-CM | POA: Diagnosis present

## 2022-05-14 DIAGNOSIS — B3781 Candidal esophagitis: Secondary | ICD-10-CM | POA: Diagnosis present

## 2022-05-14 DIAGNOSIS — K859 Acute pancreatitis without necrosis or infection, unspecified: Secondary | ICD-10-CM | POA: Diagnosis present

## 2022-05-14 DIAGNOSIS — G9340 Encephalopathy, unspecified: Secondary | ICD-10-CM | POA: Diagnosis present

## 2022-05-14 DIAGNOSIS — G9341 Metabolic encephalopathy: Secondary | ICD-10-CM | POA: Diagnosis present

## 2022-05-14 DIAGNOSIS — N19 Unspecified kidney failure: Secondary | ICD-10-CM | POA: Diagnosis not present

## 2022-05-14 DIAGNOSIS — R4189 Other symptoms and signs involving cognitive functions and awareness: Secondary | ICD-10-CM | POA: Diagnosis present

## 2022-05-14 DIAGNOSIS — D696 Thrombocytopenia, unspecified: Secondary | ICD-10-CM | POA: Diagnosis present

## 2022-05-14 DIAGNOSIS — W19XXXA Unspecified fall, initial encounter: Secondary | ICD-10-CM | POA: Diagnosis present

## 2022-05-14 DIAGNOSIS — C220 Liver cell carcinoma: Secondary | ICD-10-CM | POA: Diagnosis present

## 2022-05-14 DIAGNOSIS — I739 Peripheral vascular disease, unspecified: Secondary | ICD-10-CM | POA: Diagnosis not present

## 2022-05-14 DIAGNOSIS — R569 Unspecified convulsions: Secondary | ICD-10-CM | POA: Diagnosis present

## 2022-05-14 DIAGNOSIS — G8929 Other chronic pain: Secondary | ICD-10-CM | POA: Diagnosis present

## 2022-05-14 DIAGNOSIS — D649 Anemia, unspecified: Secondary | ICD-10-CM | POA: Diagnosis not present

## 2022-05-14 DIAGNOSIS — D631 Anemia in chronic kidney disease: Secondary | ICD-10-CM | POA: Diagnosis present

## 2022-05-14 DIAGNOSIS — D62 Acute posthemorrhagic anemia: Secondary | ICD-10-CM | POA: Diagnosis present

## 2022-05-14 DIAGNOSIS — Z66 Do not resuscitate: Secondary | ICD-10-CM | POA: Diagnosis present

## 2022-05-14 DIAGNOSIS — E1122 Type 2 diabetes mellitus with diabetic chronic kidney disease: Secondary | ICD-10-CM | POA: Diagnosis present

## 2022-05-14 DIAGNOSIS — K7030 Alcoholic cirrhosis of liver without ascites: Secondary | ICD-10-CM | POA: Diagnosis present

## 2022-05-14 DIAGNOSIS — K7469 Other cirrhosis of liver: Secondary | ICD-10-CM | POA: Diagnosis not present

## 2022-05-14 DIAGNOSIS — K219 Gastro-esophageal reflux disease without esophagitis: Secondary | ICD-10-CM | POA: Diagnosis not present

## 2022-05-14 DIAGNOSIS — Z89512 Acquired absence of left leg below knee: Secondary | ICD-10-CM

## 2022-05-14 DIAGNOSIS — E873 Alkalosis: Secondary | ICD-10-CM

## 2022-05-14 DIAGNOSIS — E43 Unspecified severe protein-calorie malnutrition: Secondary | ICD-10-CM | POA: Diagnosis present

## 2022-05-14 DIAGNOSIS — Z8711 Personal history of peptic ulcer disease: Secondary | ICD-10-CM

## 2022-05-14 DIAGNOSIS — Z515 Encounter for palliative care: Secondary | ICD-10-CM | POA: Diagnosis not present

## 2022-05-14 DIAGNOSIS — I501 Left ventricular failure: Secondary | ICD-10-CM | POA: Diagnosis present

## 2022-05-14 DIAGNOSIS — Z91199 Patient's noncompliance with other medical treatment and regimen due to unspecified reason: Secondary | ICD-10-CM

## 2022-05-14 DIAGNOSIS — K2270 Barrett's esophagus without dysplasia: Secondary | ICD-10-CM | POA: Diagnosis present

## 2022-05-14 DIAGNOSIS — K2970 Gastritis, unspecified, without bleeding: Secondary | ICD-10-CM | POA: Diagnosis present

## 2022-05-14 DIAGNOSIS — F1010 Alcohol abuse, uncomplicated: Secondary | ICD-10-CM | POA: Diagnosis present

## 2022-05-14 DIAGNOSIS — E874 Mixed disorder of acid-base balance: Secondary | ICD-10-CM | POA: Diagnosis present

## 2022-05-14 DIAGNOSIS — J90 Pleural effusion, not elsewhere classified: Secondary | ICD-10-CM | POA: Diagnosis not present

## 2022-05-14 DIAGNOSIS — E118 Type 2 diabetes mellitus with unspecified complications: Secondary | ICD-10-CM | POA: Diagnosis not present

## 2022-05-14 DIAGNOSIS — Z8616 Personal history of COVID-19: Secondary | ICD-10-CM

## 2022-05-14 DIAGNOSIS — G894 Chronic pain syndrome: Secondary | ICD-10-CM | POA: Diagnosis present

## 2022-05-14 DIAGNOSIS — K8020 Calculus of gallbladder without cholecystitis without obstruction: Secondary | ICD-10-CM | POA: Diagnosis present

## 2022-05-14 DIAGNOSIS — T50905A Adverse effect of unspecified drugs, medicaments and biological substances, initial encounter: Secondary | ICD-10-CM | POA: Diagnosis present

## 2022-05-14 DIAGNOSIS — F05 Delirium due to known physiological condition: Secondary | ICD-10-CM | POA: Diagnosis present

## 2022-05-14 DIAGNOSIS — Z7189 Other specified counseling: Secondary | ICD-10-CM | POA: Diagnosis not present

## 2022-05-14 DIAGNOSIS — K922 Gastrointestinal hemorrhage, unspecified: Secondary | ICD-10-CM | POA: Diagnosis not present

## 2022-05-14 DIAGNOSIS — E8729 Other acidosis: Secondary | ICD-10-CM

## 2022-05-14 DIAGNOSIS — I8511 Secondary esophageal varices with bleeding: Secondary | ICD-10-CM | POA: Diagnosis present

## 2022-05-14 DIAGNOSIS — N186 End stage renal disease: Secondary | ICD-10-CM | POA: Diagnosis present

## 2022-05-14 DIAGNOSIS — E1151 Type 2 diabetes mellitus with diabetic peripheral angiopathy without gangrene: Secondary | ICD-10-CM | POA: Diagnosis present

## 2022-05-14 DIAGNOSIS — Z993 Dependence on wheelchair: Secondary | ICD-10-CM

## 2022-05-14 DIAGNOSIS — I10 Essential (primary) hypertension: Secondary | ICD-10-CM | POA: Diagnosis present

## 2022-05-14 DIAGNOSIS — K3189 Other diseases of stomach and duodenum: Secondary | ICD-10-CM | POA: Diagnosis present

## 2022-05-14 DIAGNOSIS — Z6829 Body mass index (BMI) 29.0-29.9, adult: Secondary | ICD-10-CM

## 2022-05-14 DIAGNOSIS — K317 Polyp of stomach and duodenum: Secondary | ICD-10-CM | POA: Diagnosis present

## 2022-05-14 DIAGNOSIS — F17210 Nicotine dependence, cigarettes, uncomplicated: Secondary | ICD-10-CM | POA: Diagnosis present

## 2022-05-14 DIAGNOSIS — R4182 Altered mental status, unspecified: Secondary | ICD-10-CM | POA: Diagnosis not present

## 2022-05-14 DIAGNOSIS — E871 Hypo-osmolality and hyponatremia: Secondary | ICD-10-CM | POA: Diagnosis present

## 2022-05-14 DIAGNOSIS — B1920 Unspecified viral hepatitis C without hepatic coma: Secondary | ICD-10-CM | POA: Diagnosis not present

## 2022-05-14 DIAGNOSIS — K2100 Gastro-esophageal reflux disease with esophagitis, without bleeding: Secondary | ICD-10-CM | POA: Diagnosis present

## 2022-05-14 DIAGNOSIS — D500 Iron deficiency anemia secondary to blood loss (chronic): Secondary | ICD-10-CM | POA: Diagnosis present

## 2022-05-14 DIAGNOSIS — Z8673 Personal history of transient ischemic attack (TIA), and cerebral infarction without residual deficits: Secondary | ICD-10-CM

## 2022-05-14 LAB — COMPREHENSIVE METABOLIC PANEL
ALT: 58 U/L — ABNORMAL HIGH (ref 0–44)
AST: 131 U/L — ABNORMAL HIGH (ref 15–41)
Albumin: 2 g/dL — ABNORMAL LOW (ref 3.5–5.0)
Alkaline Phosphatase: 112 U/L (ref 38–126)
Anion gap: 17 — ABNORMAL HIGH (ref 5–15)
BUN: 78 mg/dL — ABNORMAL HIGH (ref 8–23)
CO2: 21 mmol/L — ABNORMAL LOW (ref 22–32)
Calcium: 8.6 mg/dL — ABNORMAL LOW (ref 8.9–10.3)
Chloride: 99 mmol/L (ref 98–111)
Creatinine, Ser: 10.36 mg/dL — ABNORMAL HIGH (ref 0.61–1.24)
GFR, Estimated: 5 mL/min — ABNORMAL LOW (ref >60)
Glucose, Bld: 102 mg/dL — ABNORMAL HIGH (ref 70–99)
Potassium: 4.6 mmol/L (ref 3.5–5.1)
Sodium: 137 mmol/L (ref 135–145)
Total Bilirubin: 0.9 mg/dL (ref 0.3–1.2)
Total Protein: 8.5 g/dL — ABNORMAL HIGH (ref 6.5–8.1)

## 2022-05-14 LAB — POC OCCULT BLOOD, ED: Fecal Occult Bld: POSITIVE — AB

## 2022-05-14 LAB — I-STAT VENOUS BLOOD GAS, ED
Acid-Base Excess: 3 mmol/L — ABNORMAL HIGH (ref 0.0–2.0)
Acid-Base Excess: 2 mmol/L (ref 0.0–2.0)
Bicarbonate: 23.6 mmol/L (ref 20.0–28.0)
Bicarbonate: 25.9 mmol/L (ref 20.0–28.0)
Calcium, Ion: 0.93 mmol/L — ABNORMAL LOW (ref 1.15–1.40)
Calcium, Ion: 1.09 mmol/L — ABNORMAL LOW (ref 1.15–1.40)
HCT: 24 % — ABNORMAL LOW (ref 39.0–52.0)
HCT: 24 % — ABNORMAL LOW (ref 39.0–52.0)
Hemoglobin: 8.2 g/dL — ABNORMAL LOW (ref 13.0–17.0)
Hemoglobin: 8.2 g/dL — ABNORMAL LOW (ref 13.0–17.0)
O2 Saturation: 97 %
O2 Saturation: 84 %
Potassium: 4.7 mmol/L (ref 3.5–5.1)
Potassium: 4.9 mmol/L (ref 3.5–5.1)
Sodium: 139 mmol/L (ref 135–145)
Sodium: 141 mmol/L (ref 135–145)
TCO2: 24 mmol/L (ref 22–32)
TCO2: 27 mmol/L (ref 22–32)
pCO2, Ven: 21.7 mmHg — ABNORMAL LOW (ref 44–60)
pCO2, Ven: 38.8 mmHg — ABNORMAL LOW (ref 44–60)
pH, Ven: 7.645 (ref 7.25–7.43)
pH, Ven: 7.433 — ABNORMAL HIGH (ref 7.25–7.43)
pO2, Ven: 72 mmHg — ABNORMAL HIGH (ref 32–45)
pO2, Ven: 47 mmHg — ABNORMAL HIGH (ref 32–45)

## 2022-05-14 LAB — CBC
HCT: 21.2 % — ABNORMAL LOW (ref 39.0–52.0)
Hemoglobin: 7.1 g/dL — ABNORMAL LOW (ref 13.0–17.0)
MCH: 32.9 pg (ref 26.0–34.0)
MCHC: 33.5 g/dL (ref 30.0–36.0)
MCV: 98.1 fL (ref 80.0–100.0)
Platelets: 100 10*3/uL — ABNORMAL LOW (ref 150–400)
RBC: 2.16 MIL/uL — ABNORMAL LOW (ref 4.22–5.81)
RDW: 18.2 % — ABNORMAL HIGH (ref 11.5–15.5)
WBC: 4.7 10*3/uL (ref 4.0–10.5)
nRBC: 0 % (ref 0.0–0.2)

## 2022-05-14 LAB — RESP PANEL BY RT-PCR (RSV, FLU A&B, COVID)  RVPGX2
Influenza A by PCR: NEGATIVE
Influenza B by PCR: NEGATIVE
Resp Syncytial Virus by PCR: NEGATIVE
SARS Coronavirus 2 by RT PCR: NEGATIVE

## 2022-05-14 LAB — I-STAT CHEM 8, ED
BUN: 93 mg/dL — ABNORMAL HIGH (ref 8–23)
Calcium, Ion: 1.05 mmol/L — ABNORMAL LOW (ref 1.15–1.40)
Chloride: 101 mmol/L (ref 98–111)
Creatinine, Ser: 12.1 mg/dL — ABNORMAL HIGH (ref 0.61–1.24)
Glucose, Bld: 102 mg/dL — ABNORMAL HIGH (ref 70–99)
HCT: 23 % — ABNORMAL LOW (ref 39.0–52.0)
Hemoglobin: 7.8 g/dL — ABNORMAL LOW (ref 13.0–17.0)
Potassium: 5.6 mmol/L — ABNORMAL HIGH (ref 3.5–5.1)
Sodium: 140 mmol/L (ref 135–145)
TCO2: 27 mmol/L (ref 22–32)

## 2022-05-14 LAB — CBC WITH DIFFERENTIAL/PLATELET
Abs Immature Granulocytes: 0.02 10*3/uL (ref 0.00–0.07)
Basophils Absolute: 0 10*3/uL (ref 0.0–0.1)
Basophils Relative: 1 %
Eosinophils Absolute: 0.1 10*3/uL (ref 0.0–0.5)
Eosinophils Relative: 3 %
HCT: 24.7 % — ABNORMAL LOW (ref 39.0–52.0)
Hemoglobin: 7.7 g/dL — ABNORMAL LOW (ref 13.0–17.0)
Immature Granulocytes: 1 %
Lymphocytes Relative: 17 %
Lymphs Abs: 0.7 10*3/uL (ref 0.7–4.0)
MCH: 32.4 pg (ref 26.0–34.0)
MCHC: 31.2 g/dL (ref 30.0–36.0)
MCV: 103.8 fL — ABNORMAL HIGH (ref 80.0–100.0)
Monocytes Absolute: 0.8 10*3/uL (ref 0.1–1.0)
Monocytes Relative: 18 %
Neutro Abs: 2.6 10*3/uL (ref 1.7–7.7)
Neutrophils Relative %: 60 %
Platelets: 101 10*3/uL — ABNORMAL LOW (ref 150–400)
RBC: 2.38 MIL/uL — ABNORMAL LOW (ref 4.22–5.81)
RDW: 18.7 % — ABNORMAL HIGH (ref 11.5–15.5)
WBC: 4.2 10*3/uL (ref 4.0–10.5)
nRBC: 0 % (ref 0.0–0.2)

## 2022-05-14 LAB — TROPONIN I (HIGH SENSITIVITY)
Troponin I (High Sensitivity): 15 ng/L (ref <18)
Troponin I (High Sensitivity): 15 ng/L (ref <18)

## 2022-05-14 LAB — PROTIME-INR
INR: 1.1 (ref 0.8–1.2)
Prothrombin Time: 14.2 seconds (ref 11.4–15.2)

## 2022-05-14 LAB — AMMONIA: Ammonia: 39 umol/L — ABNORMAL HIGH (ref 9–35)

## 2022-05-14 LAB — CBG MONITORING, ED: Glucose-Capillary: 101 mg/dL — ABNORMAL HIGH (ref 70–99)

## 2022-05-14 LAB — HEPATITIS B SURFACE ANTIGEN: Hepatitis B Surface Ag: NONREACTIVE

## 2022-05-14 MED ORDER — ALTEPLASE 2 MG IJ SOLR: 2.0000 mg | Freq: Once | INTRAMUSCULAR | Status: DC | PRN

## 2022-05-14 MED ORDER — LIDOCAINE-PRILOCAINE 2.5-2.5 % EX CREA: 1.0000 | TOPICAL_CREAM | CUTANEOUS | Status: DC | PRN

## 2022-05-14 MED ORDER — PANTOPRAZOLE SODIUM 40 MG IV SOLR
40.0000 mg | Freq: Once | INTRAVENOUS | Status: AC
  Administered 2022-05-14: 40 mg via INTRAVENOUS
  Filled 2022-05-14: qty 10

## 2022-05-14 MED ORDER — PENTAFLUOROPROP-TETRAFLUOROETH EX AERO: 1.0000 | INHALATION_SPRAY | CUTANEOUS | Status: DC | PRN

## 2022-05-14 MED ORDER — ACETAMINOPHEN 650 MG RE SUPP: 650.0000 mg | Freq: Four times a day (QID) | RECTAL | Status: DC | PRN

## 2022-05-14 MED ORDER — SODIUM CHLORIDE 0.9% FLUSH: 3.0000 mL | Freq: Two times a day (BID) | INTRAVENOUS | Status: DC

## 2022-05-14 MED ORDER — RENA-VITE PO TABS: 1.0000 | ORAL_TABLET | Freq: Every day | ORAL | Status: DC

## 2022-05-14 MED ORDER — PANTOPRAZOLE SODIUM 40 MG IV SOLR: 40.0000 mg | Freq: Two times a day (BID) | INTRAVENOUS | Status: DC

## 2022-05-14 MED ORDER — LACTULOSE 10 GM/15ML PO SOLN: 20.0000 g | Freq: Three times a day (TID) | ORAL | Status: DC | PRN

## 2022-05-14 MED ORDER — SODIUM CHLORIDE 0.9 % IV BOLUS: 1000.0000 mL | Freq: Once | INTRAVENOUS | Status: DC

## 2022-05-14 MED ORDER — ATORVASTATIN CALCIUM 10 MG PO TABS: 10.0000 mg | ORAL_TABLET | Freq: Every day | ORAL | Status: DC

## 2022-05-14 MED ORDER — LACTULOSE 10 GM/15ML PO SOLN: 20.0000 g | Freq: Two times a day (BID) | ORAL | Status: DC

## 2022-05-14 MED ORDER — GUAIFENESIN 100 MG/5ML PO LIQD
5.0000 mL | Freq: Once | ORAL | Status: AC
  Administered 2022-05-14: 5 mL via ORAL
  Filled 2022-05-14: qty 5

## 2022-05-14 MED ORDER — CHLORHEXIDINE GLUCONATE CLOTH 2 % EX PADS: 6.0000 | MEDICATED_PAD | Freq: Every day | CUTANEOUS | Status: DC

## 2022-05-14 MED ORDER — FENTANYL CITRATE PF 50 MCG/ML IJ SOSY
PREFILLED_SYRINGE | INTRAMUSCULAR | Status: AC
  Filled 2022-05-14: qty 1

## 2022-05-14 MED ORDER — IOHEXOL 350 MG/ML SOLN
75.0000 mL | Freq: Once | INTRAVENOUS | Status: AC | PRN
  Administered 2022-05-14: 75 mL via INTRAVENOUS

## 2022-05-14 MED ORDER — SODIUM CHLORIDE 0.9 % IV BOLUS: 500.0000 mL | Freq: Once | INTRAVENOUS | Status: DC

## 2022-05-14 MED ORDER — HEPARIN SODIUM (PORCINE) 1000 UNIT/ML DIALYSIS: 1000.0000 [IU] | INTRAMUSCULAR | Status: DC | PRN

## 2022-05-14 MED ORDER — LIDOCAINE HCL (PF) 1 % IJ SOLN: 5.0000 mL | INTRAMUSCULAR | Status: DC | PRN

## 2022-05-14 MED ORDER — FOLIC ACID 1 MG PO TABS: 1.0000 mg | ORAL_TABLET | Freq: Every day | ORAL | Status: DC

## 2022-05-14 MED ORDER — FENTANYL CITRATE PF 50 MCG/ML IJ SOSY
50.0000 ug | PREFILLED_SYRINGE | Freq: Once | INTRAMUSCULAR | Status: AC
  Administered 2022-05-14: 50 ug via INTRAVENOUS

## 2022-05-14 MED ORDER — ACETAMINOPHEN 325 MG PO TABS: 650.0000 mg | ORAL_TABLET | Freq: Four times a day (QID) | ORAL | Status: DC | PRN

## 2022-05-14 MED ORDER — POLYETHYLENE GLYCOL 3350 17 G PO PACK: 17.0000 g | PACK | Freq: Every day | ORAL | Status: DC | PRN

## 2022-05-14 MED ORDER — ANTICOAGULANT SODIUM CITRATE 4% (200MG/5ML) IV SOLN: 5.0000 mL | Status: DC | PRN

## 2022-05-14 NOTE — ED Notes (Signed)
Patient taken to dialysis. 

## 2022-05-14 NOTE — ED Notes (Signed)
IV team at bedside 

## 2022-05-14 NOTE — ED Notes (Signed)
Patient patient confused and alerts only to self at this time, appears comfortable with stable vitals, last time 98.55F. Urine still not collect, patient on dialysis.

## 2022-05-14 NOTE — ED Notes (Signed)
Patient transported to CT 

## 2022-05-14 NOTE — ED Notes (Signed)
Patient back from CT scan. Per CT patient's USGIV extravasated. Ice pack applied to patient's right upper arm. Patient also placed on bair hugger at this time due to rectal temperature of 94.5 F.

## 2022-05-14 NOTE — ED Provider Notes (Signed)
[facility] EMERGENCY DEPARTMENT Provider Note   CSN: [id number] Arrival date & time: 05/06/2022  1108     History  Chief Complaint  Patient presents with   Altered Mental Status    Daniel [name] is a 62 y.o. male.   Altered Mental Status     62 year old M with PMH of PAD s/p left BKA, ESRD on HD MWF, DM-2, EtOH cirrhosis, HCC, GI bleed, duodenal ulcer and AVM, recent hospitalization from 04/14/2022 to 05/06/2022 after an episode of hematemesis requiring intubation, status post extubation on 12/21, CODE STATUS changed to DNR/DNI on 12/24, received blood transfusion 2 units on 12/28-23, hospital course complicated by delirium subsequently discharged to a skilled nursing facility who presents to the emergency department from hemodialysis with altered mental status.  The history of present illness was limited by the patient's mental status on arrival.  He reportedly fell at dialysis per nursing staff we received report from EMS.  Unclear if the patient received a full session of dialysis today.  The patient was notably found to be altered at dialysis and then transported to the emergency department where he arrived AAO x 1.  He was found to have dried blood in his right nare.  He does have a history of hematemesis from GI bleeding.  Discussed care with the patient's sister over the phone. Pt coming from [facility]. The patient's sister, Daniel [name] states that he has been increasingly confused over the past two months, increasing problems with memory. The patient's sister confirmed his status as DNR. She last spoke with him on Sunday. She states that he had been complaining of abdominal pain that day.   Home Medications Prior to Admission medications   Medication Sig Start Date End Date Taking? Authorizing Provider  acetaminophen (TYLENOL) 325 MG tablet Take 2 tablets (650 mg total) by mouth every 6 (six) hours. 05/05/22   Mercy Riding, MD  albuterol  (VENTOLIN HFA) 108 (90 Base) MCG/ACT inhaler Inhale 2 puffs into the lungs every 6 (six) hours as needed for wheezing or shortness of breath. 05/05/22   Mercy Riding, MD  amLODipine (NORVASC) 10 MG tablet Take 1 tablet (10 mg total) by mouth daily. 05/06/22   Mercy Riding, MD  atorvastatin (LIPITOR) 10 MG tablet Take 1 tablet (10 mg total) by mouth daily. 05/05/22 08/03/22  Mercy Riding, MD  Calcium Carbonate Antacid (CALCIUM CARBONATE, DOSED IN MG ELEMENTAL CALCIUM,) 1250 MG/5ML SUSP Take 5 mLs (500 mg of elemental calcium total) by mouth every 6 (six) hours as needed for indigestion. 03/29/22   Charlynne Cousins, MD  folic acid (FOLVITE) 1 MG tablet Take 1 tablet (1 mg total) by mouth daily. 05/06/22   Mercy Riding, MD  lactulose (CHRONULAC) 10 GM/15ML solution Take 30 mLs (20 g total) by mouth 3 (three) times daily as needed for mild constipation. 05/05/22   Mercy Riding, MD  multivitamin (RENA-VIT) TABS tablet Take 1 tablet by mouth daily. 05/05/22   Mercy Riding, MD  pantoprazole (PROTONIX) 40 MG tablet Take 40 mg by mouth 2 (two) times daily before a meal.    [provider]  traZODone (DESYREL) 50 MG tablet Take 1 tablet (50 mg total) by mouth daily as needed for sleep. 02/10/22   [name], NP  colchicine 0.6 MG tablet Take 0.5 tablets (0.3 mg total) by mouth 2 (two) times daily. 07/24/20 07/29/20  Noemi Chapel, MD  furosemide (LASIX) 40 MG tablet  Take 1 tablet (40 mg total) by mouth daily. 07/25/19 02/09/20  Ladona Horns, MD      Allergies    Seroquel [quetiapine] and Dilaudid [hydromorphone hcl]    Review of Systems   Review of Systems  Unable to perform ROS: Mental status change    Physical Exam Updated Vital Signs BP 132/76   Pulse 95   Temp (!) 96.4 F (35.8 C) (Rectal)   Resp (!) 26   Ht '5\' 11"'$  (1.803 m)   Wt 94.8 kg   SpO2 95%   BMI 29.15 kg/m  Physical Exam Vitals and nursing note reviewed. Exam conducted with a chaperone present.  Constitutional:       General: He is not in acute distress.    Appearance: He is well-developed.     Comments: GCS 14, ABC intact  HENT:     Head: Normocephalic and atraumatic.  Eyes:     Conjunctiva/sclera: Conjunctivae normal.  Neck:     Comments: C-collar in place. No clear cervical tenderness. Cardiovascular:     Rate and Rhythm: Normal rate and regular rhythm.  Pulmonary:     Effort: Pulmonary effort is normal. No respiratory distress.     Comments: Breath sounds diminished bilaterally Chest:     Comments: Chest wall stable and non-tender to AP and lateral compression. Clavicles stable and non-tender to AP compression Abdominal:     Palpations: Abdomen is soft.     Tenderness: There is no abdominal tenderness.     Comments: Pelvis stable to lateral compression.  Genitourinary:    Rectum: Guaiac result positive.  Musculoskeletal:        General: No swelling.     Cervical back: Neck supple.     Comments: No midline tenderness to palpation of the thoracic or lumbar spine. Extremities atraumatic with intact ROM, s/p L BKA.  Skin:    General: Skin is warm and dry.     Capillary Refill: Capillary refill takes less than 2 seconds.  Neurological:     Mental Status: He is alert.     Comments: CN II-XII grossly intact. Moving all four extremities spontaneously and sensation grossly intact. No clear deficit. Slight tremor noted in the bilateral upper extremities but no clear asterixis.  Psychiatric:        Mood and Affect: Mood normal.     ED Results / Procedures / Treatments   Labs (all labs ordered are listed, but only abnormal results are displayed) Labs Reviewed  COMPREHENSIVE METABOLIC PANEL - Abnormal; Notable for the following components:      Result Value   CO2 21 (*)    Glucose, Bld 102 (*)    BUN 78 (*)    Creatinine, Ser 10.36 (*)    Calcium 8.6 (*)    Total Protein 8.5 (*)    Albumin 2.0 (*)    AST 131 (*)    ALT 58 (*)    GFR, Estimated 5 (*)    Anion gap 17 (*)    All other  components within normal limits  CBC WITH DIFFERENTIAL/PLATELET - Abnormal; Notable for the following components:   RBC 2.38 (*)    Hemoglobin 7.7 (*)    HCT 24.7 (*)    MCV 103.8 (*)    RDW 18.7 (*)    Platelets 101 (*)    All other components within normal limits  AMMONIA - Abnormal; Notable for the following components:   Ammonia 39 (*)    All other components within normal  limits  CBG MONITORING, ED - Abnormal; Notable for the following components:   Glucose-Capillary 101 (*)    All other components within normal limits  I-STAT CHEM 8, ED - Abnormal; Notable for the following components:   Potassium 5.6 (*)    BUN 93 (*)    Creatinine, Ser 12.10 (*)    Glucose, Bld 102 (*)    Calcium, Ion 1.05 (*)    Hemoglobin 7.8 (*)    HCT 23.0 (*)    All other components within normal limits  I-STAT VENOUS BLOOD GAS, ED - Abnormal; Notable for the following components:   pH, Ven 7.645 (*)    pCO2, Ven 21.7 (*)    pO2, Ven 72 (*)    Acid-Base Excess 3.0 (*)    Calcium, Ion 0.93 (*)    HCT 24.0 (*)    Hemoglobin 8.2 (*)    All other components within normal limits  POC OCCULT BLOOD, ED - Abnormal; Notable for the following components:   Fecal Occult Bld POSITIVE (*)    All other components within normal limits  RESP PANEL BY RT-PCR (RSV, FLU A&B, COVID)  RVPGX2  CULTURE, BLOOD (ROUTINE X 2)  CULTURE, BLOOD (ROUTINE X 2)  URINE CULTURE  URINALYSIS, ROUTINE W REFLEX MICROSCOPIC  RAPID URINE DRUG SCREEN, HOSP PERFORMED  ETHANOL  I-STAT VENOUS BLOOD GAS, ED  TROPONIN I (HIGH SENSITIVITY)  TROPONIN I (HIGH SENSITIVITY)    EKG EKG Interpretation  Date/Time:  Wednesday May 14 2022 11:33:35 EST Ventricular Rate:  88 PR Interval:  169 QRS Duration: 86 QT Interval:  379 QTC Calculation: 459 R Axis:   20 Text Interpretation: Sinus rhythm Anterior infarct, old Borderline T abnormalities, inferior leads Confirmed by Regan Lemming ([id number]) on 05/28/2022 12:04:07 PM  Radiology CT  Cervical Spine Wo Contrast  Result Date: 05/16/2022 CLINICAL DATA:  Neck trauma.  Intoxicated. EXAM: CT CERVICAL SPINE WITHOUT CONTRAST TECHNIQUE: Multidetector CT imaging of the cervical spine was performed without intravenous contrast. Multiplanar CT image reconstructions were also generated. RADIATION DOSE REDUCTION: This exam was performed according to the departmental dose-optimization program which includes automated exposure control, adjustment of the mA and/or kV according to patient size and/or use of iterative reconstruction technique. COMPARISON:  March 29, 2022 FINDINGS: Alignment: The study is degraded by motion artifact. No significant alignment abnormalities. Skull base and vertebrae: No acute fracture. No primary bone lesion or focal pathologic process. Soft tissues and spinal canal: No prevertebral fluid or swelling. No visible canal hematoma. Disc levels:  Multilevel osteoarthritic changes Upper chest: Large left pleural effusion. Other: None. IMPRESSION: 1. No acute fractures or subluxation of the cervical spine, accounting for significant motion artifact. 2. Multilevel osteoarthritic changes of the cervical spine. 3. Large left pleural effusion. Electronically Signed   By: Fidela Salisbury M.D.   On: 05/02/2022 12:52   CT MAXILLOFACIAL WO CONTRAST  Result Date: 05/06/2022 CLINICAL DATA:  Facial trauma EXAM: CT MAXILLOFACIAL WITHOUT CONTRAST TECHNIQUE: Multidetector CT imaging of the maxillofacial structures was performed. Multiplanar CT image reconstructions were also generated. RADIATION DOSE REDUCTION: This exam was performed according to the departmental dose-optimization program which includes automated exposure control, adjustment of the mA and/or kV according to patient size and/or use of iterative reconstruction technique. COMPARISON:  CT head 04/15/2022 FINDINGS: Osseous: There is no acute facial bone fracture. There is no evidence of mandibular dislocation. There is no  suspicious osseous lesion. Orbits: The globes are intact.  There is no retrobulbar hematoma. Sinuses: Clear. Soft tissues:  Unremarkable. Limited intracranial: Assessed on the separately dictated CT head. Other: There are multiple dental caries and periapical lucencies. IMPRESSION: No acute facial bone fracture. Electronically Signed   By: Valetta Mole M.D.   On: 05/28/2022 12:51   CT HEAD WO CONTRAST  Result Date: 05/28/2022 CLINICAL DATA:  Mental status change. EXAM: CT HEAD WITHOUT CONTRAST TECHNIQUE: Contiguous axial images were obtained from the base of the skull through the vertex without intravenous contrast. RADIATION DOSE REDUCTION: This exam was performed according to the departmental dose-optimization program which includes automated exposure control, adjustment of the mA and/or kV according to patient size and/or use of iterative reconstruction technique. COMPARISON:  April 15, 2022 FINDINGS: Brain: No evidence of acute infarction, hemorrhage, hydrocephalus, extra-axial collection or mass lesion/mass effect. Moderate brain parenchymal volume loss and deep white matter microangiopathy. Vascular: Significant calcific atherosclerotic disease of the vertebral arteries and intra cavernous carotid arteries. Skull: Normal. Negative for fracture or focal lesion. Sinuses/Orbits: No acute finding. Other: None. IMPRESSION: 1. No acute intracranial abnormality. 2. Moderate brain parenchymal volume loss and deep white matter microangiopathy. Electronically Signed   By: Fidela Salisbury M.D.   On: 05/12/2022 12:47   DG Chest Port 1 View  Result Date: 05/25/2022 CLINICAL DATA:  Altered mental status, history of diabetes EXAM: PORTABLE CHEST 1 VIEW COMPARISON:  Chest x-ray April 24, 2022 FINDINGS: Unchanged cardiomegaly. Bilateral reticular pulmonary opacities. Large left pleural effusion and moderate right pleural effusion. No large pneumothorax. No acute osseous abnormality. Vascular stent overlying  the left axilla. The visualized upper abdomen is unremarkable. IMPRESSION: 1. Large left and moderate right pleural effusions. 2. Bilateral reticular pulmonary opacities, likely moderate pulmonary edema. 3. Cardiomegaly. Electronically Signed   By: Beryle Flock M.D.   On: 05/11/2022 12:10    Procedures Ultrasound ED Peripheral IV (Provider)  Date/Time: 05/01/2022 3:28 PM  Performed by: Regan Lemming, MD Authorized by: Regan Lemming, MD   Procedure details:    Indications: multiple failed IV attempts and poor IV access     Skin Prep: chlorhexidine gluconate     Location:  Right AC   Angiocath:  20 G   Bedside Ultrasound Guided: Yes     Images: not archived     Patient tolerated procedure without complications: Yes     Dressing applied: Yes       Medications Ordered in ED Medications  fentaNYL (SUBLIMAZE) injection 50 mcg (0 mcg Intravenous Hold 05/23/2022 1241)  pantoprazole (PROTONIX) injection 40 mg (40 mg Intravenous Given 05/28/2022 1421)    ED Course/ Medical Decision Making/ A&P Clinical Course as of 05/02/2022 1549  Wed May 14, 2022  1351 Fecal Occult Blood, POC(!): POSITIVE [JL]  1351 Hemoglobin(!): 7.7 [JL]  1351 Ammonia(!): 39 [JL]  1351 BUN(!): 78 [JL]  1549 pH, Ven(!!): 7.645 [JL]  1549 pCO2, Ven(!): 21.7 [JL]    Clinical Course User Index [JL] Regan Lemming, MD                             Medical Decision Making Amount and/or Complexity of Data Reviewed Labs: ordered. Decision-making details documented in ED Course. Radiology: ordered.  Risk Prescription drug management. Decision regarding hospitalization.    62 year old M with PMH of PAD s/p left BKA, ESRD on HD MWF, DM-2, EtOH cirrhosis, HCC, GI bleed, duodenal ulcer and AVM, recent hospitalization from 04/14/2022 to 05/06/2022 after an episode of hematemesis requiring intubation, status post extubation on 12/21, CODE STATUS changed to  DNR/DNI on 12/24, received blood transfusion 2 units on 12/28-23,  hospital course complicated by delirium subsequently discharged to a skilled nursing facility who presents to the emergency department from hemodialysis with altered mental status.  The history of present illness was limited by the patient's mental status on arrival.  He reportedly fell at dialysis per nursing staff we received report from EMS.  Unclear if the patient received a full session of dialysis today.  The patient was notably found to be altered at dialysis and then transported to the emergency department where he arrived AAO x 1.  He was found to have dried blood in his right nare.  He does have a history of hematemesis from GI bleeding.  On arrival, the patient was afebrile, not tachycardic, mildly tachypneic RR 21, BP 161/86, saturating 93% on room air.  Sinus rhythm noted on cardiac telemetry.  Patient physical exam concerning for patient with dried blood in the right nare, fecal occult positive rectal exam with no gross hematochezia or melena noted.  Patient has a history of hematemesis.  Concern for recurrent GI bleeding with evidence of potential hematemesis.  No active hematemesis in the ED.  Additionally considered nasal fracture in the setting of a fall at dialysis which was reported by nursing.  Will obtain CT of the head, maxilla face, cervical spine to further evaluate.  Will maintain chest x-ray and further workup the patient's change in mental status.  Differential diagnosis is broad and includes altered mental status from infectious etiology, consider genitourinary, respiratory, intra-abdominal.  Considered electrolyte abnormality, other toxic metabolic derangement such as hepatic encephalopathy.  Patient without clear asterixis on exam.  Considered CVA although no focal neurologic deficit.  No clear evidence of seizure.  Evaluation significant for a respiratory alkalosis with a pH of 7.645, pCO2 of 21.7 concerning for respiratory alkalosis potentially from hyperventilation.  CBC with  evidence of a 2 point hemoglobin drop with no leukocytosis, WBC 4.2, hemoglobin of 7.7, decreased from 9.812 days ago.  The patient's ammonia was mildly elevated at 39, initial CBG on arrival was 101.  CMP revealed no hyperkalemia, evidence of uremia with a BUN of 78.  This could be uremia in the setting of the patient's ESRD versus uremia in the setting of a GI bleed.  The patient's fecal occult was positive. EKG without STEMI and initial troponin normal. Low concern for ACS, PE.   CT imaging of the head, maxillofacial and cervical spine was without evidence of traumatic injury. C-collar cleared after patient showed improvement in his mental status.   CXR: IMPRESSION:  1. Large left and moderate right pleural effusions.  2. Bilateral reticular pulmonary opacities, likely moderate  pulmonary edema.  3. Cardiomegaly.   The patient has new bilateral large pleural effusions compared to x-ray imaging on 04/24/2022.  This could be resulting in tachypnea and subsequent respiratory alkalosis causing a change in his mental status.  The patient is not hypoxic and vitally stable.  He could benefit from thoracentesis inpatient but does not require it emergently.  My concern is for recurrent GI bleed given the patient's history of duodenal ulcers and an AVM. Additional concern for hepatic encephalopathy, uremic encephalopathy. No clear evidence for CVA on exam. Patient with intact ROM of the neck, no rigidity, low concern for meningitis.   Pt coming from [facility]. The patient's sister, Daniel [name] states that he has been increasingly confused over the past two months, increasing problems with memory. The patient's sister confirmed his  status as DNR. She states that he had been complaining of abdominal pain on Sunday.   [facility] consulted for further recommendations.   On repeat assessment, Pt O2 sats were hypoxic to 88% and he was subsequently placed on 2L O2 via nasal  cannula.   CTA GIB protocol ordered. Pt on reassessment has had improvement in his mentation. Unfortunately, the patient's IV extravasated and he underwent a noncontrasted study. Spoke with Azucena Freed, PA of on-call [facility], who will see the patient in consultation.   Repeat VBG revealed improvement in the patient's respiratory alkalosis, pH 7.43, pCO2 38.8. Pt stable for floor admission at this time for further evaluation and management of multiple presenting problems to include acute encephalopathy, bilateral pleural effusions, anemia with concern for acute on chronic GI bleeding, hypoxia likely from hypervolemia/ESRD/cirrhosis and pleural effusions. Dr. Trilby Drummer of hospitalist medicine consulted and accepted the patient in admission.   Final Clinical Impression(s) / ED Diagnoses Final diagnoses:  Anemia, unspecified type  Gastrointestinal hemorrhage, unspecified gastrointestinal hemorrhage type  Acute respiratory failure with hypoxia (HCC)  Pleural effusion  Respiratory alkalosis  Altered mental status, unspecified altered mental status type  Increased anion gap metabolic acidosis  Uremia    Rx / DC Orders ED Discharge Orders     None         Regan Lemming, MD 05/04/2022 1842

## 2022-05-14 NOTE — Consult Note (Addendum)
[facility] Consult: 3:58 PM 05/05/2022  LOS: 0 days    Referring Provider Dr Armandina Gemma Primary Care Physician:  [name], NP Primary Gastroenterologist: Althia Forts.  He is only been seen by GI as an in patient.    Reason for Consultation: Anemia due to ESRD and blood loss.   HPI: Daniel [name] is a 62 y.o. male.   Nonambulatory, wheelchair-bound.  See outlined PMH below.  Previous Plavix after March 2023 R SFA stenting, discontinued due to recurrent GI bleeding and anemia.  ESRD.  MWF HD.  Recurrent GI bleeding with findings of duodenal ulcer, small bowel AVMs, erosive esophagitis, hyperplastic duodenal polyps on previous endo studies.  Chronic anemia (IDA, chronic disease, GI sources). HCV, never treated, quantitative 9,880,000 in 07/2021.  Hep cellular Ca dx 07/2019, started Y90 treatment for [facility] on 7/13, again on 8/3.  Chronic hyponatremia.    Latest GI studies include but there are several others dating to March 2021. 06/2019 colonoscopy: sigmoid diverticulosis, possible old blood in terminal ileum suggesting SB source.  Barrett's esophagus on 01/2021 EGD. 07/05/2021 SBE.  Friable gastric mucosa, normal esophagus.  A few, recently bleeding AVMs in duodenum treated with monopolar probe.  Single nonbleeding jejunal AVM treated with probe 07/05/2021 flex sig with blood in rectum, rectosigmoid and sigmoid. 08/06/21 SBE: Severe, circumferential esophagitis without bleeding.  Pill esophagitis suspected.  Friable gastric mucosa/gastritis.  Multiple friable duodenal polyps bled on contact and possible source of GI bleed.  Examined jejunum normal.  Pathology showed Candida esophagitis with erosion.  Gastric polyps were hyperplastic with focal erosion at surface, no H. pylori.  Duodenal polyps also hyperplastic,  no features of celiac disease on duodenal biopsies. 08/24/2021 SBE.  Distal extent tattooed.  Grade 1 esophageal varices.  Portal hypertensive gastropathy.  Duodenal polyps. 08/25/2021 VCE.  Duodenal polyps.  Small red spot vs diminutive AVM without blood anywhere.  Green liquid secretions and stool throughout the bowel.  Study considered nondiagnostic 09/15/2021 SBE.  3 duodenal AVMs treated with APC and felt to be likely source of bleeding.  Small esophageal varices, portal hypertensive gastropathy and solitary gastric polyp. 10/06/2021 VCE, capsule placed using endoscope.  Capsule moved back into the stomach then into the duodenum at about 6 hours and 20-minute 9 minutes then back into stomach where battery life of 12 hours expired.  Visualized previously cauterized sites of pre-pyloric/duodenal polyps, duodenal AVM w heme in region.  Dr. Carlean Purl chose not to pursue additional endoscopy.  Felt pt would need deep enteroscopy which may require tertiary care facility.  MD said "afraid he will have recurrent problems regardless" 08/05/2021 CT angio abdomen pelvis.  No active bleeding.  Outpouching near expected position of duodenal bulb or post bulbar duodenum, suspicious for focal duodenal ulcer.  Adjacent retroperitoneal fat edema.  4.4 cm mass near juncture of liver segments IVb and VIII as well as a possible adjacent 2.2 cm mass in segment VIII.  Differential includes hepatocellular carcinoma, neuroendocrine carcinoma.  Not excluded for cholangiocarcinoma and metastatic disease. 08/07/21 MRI abd, MRCP: Cirrhosis.  5 cm mass  at segment 4A is LI-RADS 5, c/w hepatocellular carcinoma.  2 cm mass in the central right hepatic lobe LI-RADS 3. Consider follow-up in 3-6 months 10/09/2021: CT chest/abd/pelvis: Extensive multifocal pulmonary consolidation, infiltrate progressive compared with previous, bilateral pleural effusions.  Stable mild peripancreatic edema in pancreaticoduodenal groove, Querry mild  edema/interstitial pancreatitis.  Central hepatic masses not well delineated on noncontrast study.  Cirrhosis.  Cholelithiasis. Nonobstructing left nephrolithiasis AFP 13.9 in mid April 2023, 10.5 in early May 2023 11/14/2021 SBE. For Hgb 6.9, platelets 108, black stools:  Grade 1, nonbleeding esophageal varices, no stigmata of bleeding.  Nodular bulbar mucosa.  Tattoo seen at jejunum.  No blood, no bleeding, no obvious source of bleeding.  Dr. Loletha Carrow suspected distal small bowel is beyond the reach of enteroscope.  MD raised idea of adding long-acting octreotide therapy, IV iron and transfusion prn.     At last hospital GI encounter of 12/18 for dark bloody stool, Dr. Bryan Lemma noted "patient has recurrent anemia from small bowel AVMs which cannot be adequately managed endoscopically, some of the AVMs are out of reach of enteroscopy, but others are within reach, but simply recur despite successful endoscopic intervention.  This is all 2/2 underlying comorbidities including ESRD on HD.  No role for repeat endoscopy."  Dr. Recommended octreotide while inpatient and, if insurance would pay for it, octreotide LAR which might increase interval time to next transfusion requirement.   Received blood during this and multiple other admissions. He had 2 admissions in December and 1 earlier this month.  Issues included volume overload, respiratory failure, pulmonary edema.  Uncontrolled hypertension. Anemia requiring transfusion. Metabolic encephalopathy, delirium.  Discussion with palliative care and he is DNR/DN  In past, insurance would not cover Octreotide.   Did not have planned Liver protocol CTAP w angio set for 04/25/22 for fup of HCCA.    Assume compliance w Protonix 40 po bid as he lives at [facility].    Brought to [facility] ED today because he became altered, confused at dialysis today.  Remains confused in the ED and is not able to give much history but denies abdominal pain, nausea, vomiting, black or bloody  stools.  Hgb 7.7.  9.8 12 days ago.  As low as 6.7 on 12/27 3 weeks ago. CT angio bleeding study attempted.  However the IV contrast leaked from the IV site so they were not able to get an adequate study.  Though there is no formal report, the radiology tech at bedside tells me that his lungs were full of fluid.     Past Medical History:  Diagnosis Date   Anemia    Diabetes mellitus without complication [facility])    patient denies   Dialysis patient [facility])    End stage chronic kidney disease ([place])    Hypertension    ICH (intracerebral hemorrhage) ([place]) 05/20/2017   PAD (peripheral artery disease) ([facility])    Shoulder pain, left 06/28/2013    Past Surgical History:  Procedure Laterality Date   A/V FISTULAGRAM N/A 08/15/2020   Procedure: A/V FISTULAGRAM - Left Upper;  Surgeon: Cherre Robins, MD;  Location: [place] CV LAB;  Service: Cardiovascular;  Laterality: N/A;   ABDOMINAL AORTOGRAM W/LOWER EXTREMITY N/A 04/08/2021   Procedure: ABDOMINAL AORTOGRAM W/LOWER EXTREMITY;  Surgeon: Waynetta Sandy, MD;  Location: [place] CV LAB;  Service: Cardiovascular;  Laterality: N/A;   AMPUTATION Left 04/16/2021   Procedure: LEFT BELOW KNEE AMPUTATION;  Surgeon: Angelia Mould, MD;  Location: [place];  Service: Vascular;  Laterality: Left;   AMPUTATION Left 06/17/2021   Procedure: REVISION AMPUTATION BELOW KNEE;  Surgeon: Broadus John, MD;  Location: [place];  Service: Vascular;  Laterality: Left;   APPENDECTOMY     APPLICATION OF WOUND VAC Left 06/17/2021   Procedure: APPLICATION OF WOUND VAC;  Surgeon: Broadus John, MD;  Location: [place];  Service: Vascular;  Laterality: Left;   AV FISTULA PLACEMENT Left 08/03/2019   Procedure: LEFT ARM ARTERIOVENOUS (AV) CEPHALIC  FISTULA CREATION;  Surgeon: Waynetta Sandy, MD;  Location: [place];  Service: Vascular;  Laterality: Left;   BIOPSY  06/30/2019   Procedure: BIOPSY;  Surgeon: Ronald Lobo, MD;  Location: [facility] ENDOSCOPY;   Service: Endoscopy;;   BIOPSY  08/02/2019   Procedure: BIOPSY;  Surgeon: Yetta Flock, MD;  Location: [facility] ENDOSCOPY;  Service: Gastroenterology;;   BIOPSY  02/08/2021   Procedure: BIOPSY;  Surgeon: Sharyn Creamer, MD;  Location: [facility] ENDOSCOPY;  Service: Gastroenterology;;   BIOPSY  02/24/2021   Procedure: BIOPSY;  Surgeon: [name], MD;  Location: [facility] ENDOSCOPY;  Service: Gastroenterology;;   BIOPSY  03/26/2021   Procedure: BIOPSY;  Surgeon: Lavena Bullion, DO;  Location: [place];  Service: Gastroenterology;;   BIOPSY  08/06/2021   Procedure: BIOPSY;  Surgeon: [name], MD;  Location: [place];  Service: Gastroenterology;;   BIOPSY  09/15/2021   Procedure: BIOPSY;  Surgeon: Sharyn Creamer, MD;  Location: [place];  Service: Gastroenterology;;   BRONCHIAL BIOPSY  12/26/2021   Procedure: BRONCHIAL BIOPSIES;  Surgeon: Juanito Doom, MD;  Location: [place];  Service: Cardiopulmonary;;   BRONCHIAL WASHINGS  12/26/2021   Procedure: BRONCHIAL WASHINGS;  Surgeon: Juanito Doom, MD;  Location: [place];  Service: Cardiopulmonary;;   COLONOSCOPY  01/23/2012   Procedure: COLONOSCOPY;  Surgeon: Danie Binder, MD;  [facility];  Service: Endoscopy;  Laterality: N/A;  11:10 AM   COLONOSCOPY WITH PROPOFOL N/A 06/30/2019   Procedure: COLONOSCOPY WITH PROPOFOL;  Surgeon: Ronald Lobo, MD;  Location: [place];  Service: Endoscopy;  Laterality: N/A;   ENTEROSCOPY N/A 08/02/2019   Procedure: ENTEROSCOPY;  Surgeon: Yetta Flock, MD;  Location: [facility] ENDOSCOPY;  Service: Gastroenterology;  Laterality: N/A;   ENTEROSCOPY N/A 02/24/2021   Procedure: ENTEROSCOPY;  Surgeon: [name], MD;  Location: [facility] ENDOSCOPY;  Service: Gastroenterology;  Laterality: N/A;   ENTEROSCOPY N/A 03/26/2021   Procedure: ENTEROSCOPY;  Surgeon: Lavena Bullion, DO;  Location: [place];  Service: Gastroenterology;  Laterality: N/A;   ENTEROSCOPY  N/A 07/05/2021   Procedure: ENTEROSCOPY;  Surgeon: Carol Ada, MD;  Location: [place];  Service: Gastroenterology;  Laterality: N/A;   ENTEROSCOPY N/A 08/06/2021   Procedure: ENTEROSCOPY;  Surgeon: [name], MD;  Location: [facility] ENDOSCOPY;  Service: Gastroenterology;  Laterality: N/A;   ENTEROSCOPY N/A 08/24/2021   Procedure: ENTEROSCOPY;  Surgeon: Ladene Artist, MD;  Location: [facility] ENDOSCOPY;  Service: Gastroenterology;  Laterality: N/A;   ENTEROSCOPY N/A 09/15/2021   Procedure: ENTEROSCOPY;  Surgeon: Sharyn Creamer, MD;  Location: [facility] ENDOSCOPY;  Service: Gastroenterology;  Laterality: N/A;   ENTEROSCOPY N/A 10/06/2021   Procedure: ENTEROSCOPY;  Surgeon: [name], MD;  Location: [facility] ENDOSCOPY;  Service: Gastroenterology;  Laterality: N/A;   ENTEROSCOPY N/A 11/14/2021   Procedure: ENTEROSCOPY;  Surgeon: Doran Stabler, MD;  Location: [facility] ENDOSCOPY;  Service: Gastroenterology;  Laterality: N/A;   ESOPHAGOGASTRODUODENOSCOPY N/A 08/10/2020   Procedure: ESOPHAGOGASTRODUODENOSCOPY (EGD);  Surgeon:  Pyrtle, Lajuan Lines, MD;  Location: [facility] ENDOSCOPY;  Service: Gastroenterology;  Laterality: N/A;   ESOPHAGOGASTRODUODENOSCOPY (EGD) WITH PROPOFOL N/A 06/30/2019   Procedure: ESOPHAGOGASTRODUODENOSCOPY (EGD) WITH PROPOFOL;  Surgeon: Ronald Lobo, MD;  Location: [place];  Service: Endoscopy;  Laterality: N/A;   ESOPHAGOGASTRODUODENOSCOPY (EGD) WITH PROPOFOL N/A 01/12/2021   Procedure: ESOPHAGOGASTRODUODENOSCOPY (EGD) WITH PROPOFOL;  Surgeon: Sharyn Creamer, MD;  Location: [place];  Service: Gastroenterology;  Laterality: N/A;   ESOPHAGOGASTRODUODENOSCOPY (EGD) WITH PROPOFOL N/A 02/08/2021   Procedure: ESOPHAGOGASTRODUODENOSCOPY (EGD) WITH PROPOFOL;  Surgeon: Sharyn Creamer, MD;  Location: [place];  Service: Gastroenterology;  Laterality: N/A;   FISTULA SUPERFICIALIZATION Left 10/17/2019   Procedure: LEFT UPPER EXTREMITY FISTULA REVISION, SIDE BRANCH LIGATION,  AND  SUPERFICIALIZATION;  Surgeon: Marty Heck, MD;  Location: [place];  Service: Vascular;  Laterality: Left;   FISTULA SUPERFICIALIZATION Left 17/49/4496   Procedure: PLICATION OF ANEURYSM LEFT ARTERIOVENOUS FISTULA;  Surgeon: Angelia Mould, MD;  Location: [place];  Service: Vascular;  Laterality: Left;   FLEXIBLE SIGMOIDOSCOPY N/A 07/05/2021   Procedure: FLEXIBLE SIGMOIDOSCOPY;  Surgeon: Carol Ada, MD;  Location: [place];  Service: Gastroenterology;  Laterality: N/A;   GIVENS CAPSULE STUDY N/A 06/30/2019   Procedure: GIVENS CAPSULE STUDY;  Surgeon: Ronald Lobo, MD;  Location: [place];  Service: Endoscopy;  Laterality: N/A;   GIVENS CAPSULE STUDY N/A 08/25/2021   Procedure: GIVENS CAPSULE STUDY;  Surgeon: Ladene Artist, MD;  Location: [place];  Service: Gastroenterology;  Laterality: N/A;   GIVENS CAPSULE STUDY N/A 10/06/2021   Procedure: GIVENS CAPSULE STUDY;  Surgeon: [name], MD;  Location: [place];  Service: Gastroenterology;  Laterality: N/A;   HEMOSTASIS CLIP PLACEMENT  08/10/2020   Procedure: HEMOSTASIS CLIP PLACEMENT;  Surgeon: Jerene Bears, MD;  Location: [place];  Service: Gastroenterology;;   HEMOSTASIS CLIP PLACEMENT  01/12/2021   Procedure: HEMOSTASIS CLIP PLACEMENT;  Surgeon: Sharyn Creamer, MD;  Location: [place];  Service: Gastroenterology;;   HEMOSTASIS CONTROL  08/02/2019   Procedure: HEMOSTASIS CONTROL;  Surgeon: Yetta Flock, MD;  Location: [place];  Service: Gastroenterology;;   HOT HEMOSTASIS  02/24/2021   Procedure: HOT HEMOSTASIS (ARGON PLASMA COAGULATION/BICAP);  Surgeon: [name], MD;  Location: [facility] ENDOSCOPY;  Service: Gastroenterology;;   HOT HEMOSTASIS N/A 03/26/2021   Procedure: HOT HEMOSTASIS (ARGON PLASMA COAGULATION/BICAP);  Surgeon: Lavena Bullion, DO;  Location: [facility] ENDOSCOPY;  Service: Gastroenterology;  Laterality: N/A;   HOT HEMOSTASIS N/A 07/05/2021   Procedure: HOT HEMOSTASIS  (ARGON PLASMA COAGULATION/BICAP);  Surgeon: Carol Ada, MD;  Location: [place];  Service: Gastroenterology;  Laterality: N/A;   HOT HEMOSTASIS N/A 09/15/2021   Procedure: HOT HEMOSTASIS (ARGON PLASMA COAGULATION/BICAP);  Surgeon: Sharyn Creamer, MD;  Location: [place];  Service: Gastroenterology;  Laterality: N/A;   HOT HEMOSTASIS N/A 10/06/2021   Procedure: HOT HEMOSTASIS (ARGON PLASMA COAGULATION/BICAP);  Surgeon: [name], MD;  Location: [place];  Service: Gastroenterology;  Laterality: N/A;   INCISION AND DRAINAGE ABSCESS N/A 06/29/2016   Procedure: INCISION AND DRAINAGE ABDOMINAL WALL ABSCESS;  Surgeon: Alphonsa Overall, MD;  Location: [facility];  Service: General;  Laterality: N/A;   INSERTION OF DIALYSIS CATHETER Right 08/03/2019   Procedure: INSERTION OF DIALYSIS CATHETER;  Surgeon: Waynetta Sandy, MD;  Location: [place];  Service: Vascular;  Laterality: Right;   INSERTION OF DIALYSIS CATHETER Right 10/22/2019   Procedure: INSERTION OF 23CM TUNNELED DIALYSIS CATHETER RIGHT INTERNAL JUGULAR;  Surgeon: Angelia Mould, MD;  [facility];  Service: Vascular;  Laterality: Right;   INSERTION OF DIALYSIS CATHETER Right 08/12/2020   Procedure: INSERTION OF Right internal Jugular TUNNELED  DIALYSIS CATHETER.;  Surgeon: Waynetta Sandy, MD;  Location: [place];  Service: Vascular;  Laterality: Right;   INSERTION OF DIALYSIS CATHETER N/A 04/19/2021   Procedure: INSERTION OF TUNNELED DIALYSIS CATHETER;  Surgeon: Angelia Mould, MD;  [facility];  Service: Vascular;  Laterality: N/A;   IR 3D INDEPENDENT WKST  11/07/2021   IR ANGIOGRAM SELECTIVE EACH ADDITIONAL VESSEL  11/07/2021   IR ANGIOGRAM SELECTIVE EACH ADDITIONAL VESSEL  11/07/2021   IR ANGIOGRAM SELECTIVE EACH ADDITIONAL VESSEL  11/07/2021   IR ANGIOGRAM SELECTIVE EACH ADDITIONAL VESSEL  11/28/2021   IR ANGIOGRAM VISCERAL SELECTIVE  11/07/2021   IR ANGIOGRAM VISCERAL SELECTIVE  11/28/2021   IR EMBO  ARTERIAL NOT HEMORR HEMANG INC GUIDE ROADMAPPING  11/07/2021   IR EMBO TUMOR ORGAN ISCHEMIA INFARCT INC GUIDE ROADMAPPING  11/28/2021   IR PARACENTESIS  02/20/2022   IR RADIOLOGIST EVAL & MGMT  09/12/2021   IR US GUIDE VASC ACCESS LEFT  11/07/2021   IR US GUIDE VASC ACCESS LEFT  11/28/2021   Left heel surgery     LOWER EXTREMITY ANGIOGRAPHY N/A 07/08/2021   Procedure: Lower Extremity Angiography;  Surgeon: Cherre Robins, MD;  Location: [place] CV LAB;  Service: Cardiovascular;  Laterality: N/A;   PENILE BIOPSY N/A 03/26/2020   Procedure: PENILE ULCER DEBRIDEMENT;  Surgeon: Remi Haggard, MD;  [facility];  Service: Urology;  Laterality: N/A;  30 MINS   PERIPHERAL VASCULAR INTERVENTION Right 07/08/2021   Procedure: PERIPHERAL VASCULAR INTERVENTION;  Surgeon: Cherre Robins, MD;  Location: [place] [facility];  Service: Cardiovascular;  Laterality: Right;   SCLEROTHERAPY  01/12/2021   Procedure: SCLEROTHERAPY;  Surgeon: Sharyn Creamer, MD;  Location: [facility] ENDOSCOPY;  Service: Gastroenterology;;   Clide Deutscher  02/24/2021   Procedure: Clide Deutscher;  Surgeon: [name], MD;  Location: [place];  Service: Gastroenterology;;   SUBMUCOSAL TATTOO INJECTION  08/24/2021   Procedure: SUBMUCOSAL TATTOO INJECTION;  Surgeon: Ladene Artist, MD;  Location: [place];  Service: Gastroenterology;;   SUBMUCOSAL TATTOO INJECTION  09/15/2021   Procedure: SUBMUCOSAL TATTOO INJECTION;  Surgeon: Sharyn Creamer, MD;  Location: [place];  Service: Gastroenterology;;   VIDEO BRONCHOSCOPY N/A 12/26/2021   Procedure: VIDEO BRONCHOSCOPY WITH FLUORO;  Surgeon: Juanito Doom, MD;  Location: [place];  Service: Cardiopulmonary;  Laterality: N/A;    Prior to Admission medications   Medication Sig Start Date End Date Taking? Authorizing Provider  acetaminophen (TYLENOL) 325 MG tablet Take 2 tablets (650 mg total) by mouth every 6 (six) hours. 05/05/22   Mercy Riding, MD  albuterol  (VENTOLIN HFA) 108 (90 Base) MCG/ACT inhaler Inhale 2 puffs into the lungs every 6 (six) hours as needed for wheezing or shortness of breath. 05/05/22   Mercy Riding, MD  amLODipine (NORVASC) 10 MG tablet Take 1 tablet (10 mg total) by mouth daily. 05/06/22   Mercy Riding, MD  atorvastatin (LIPITOR) 10 MG tablet Take 1 tablet (10 mg total) by mouth daily. 05/05/22 08/03/22  Mercy Riding, MD  Calcium Carbonate Antacid (CALCIUM CARBONATE, DOSED IN MG ELEMENTAL CALCIUM,) 1250 MG/5ML SUSP Take 5 mLs (500 mg of elemental calcium total) by mouth every 6 (six) hours as needed for indigestion. 03/29/22   Charlynne Cousins, MD  folic acid (FOLVITE) 1 MG tablet Take 1 tablet (1  mg total) by mouth daily. 05/06/22   Mercy Riding, MD  lactulose (CHRONULAC) 10 GM/15ML solution Take 30 mLs (20 g total) by mouth 3 (three) times daily as needed for mild constipation. 05/05/22   Mercy Riding, MD  multivitamin (RENA-VIT) TABS tablet Take 1 tablet by mouth daily. 05/05/22   Mercy Riding, MD  pantoprazole (PROTONIX) 40 MG tablet Take 40 mg by mouth 2 (two) times daily before a meal.    [provider]  traZODone (DESYREL) 50 MG tablet Take 1 tablet (50 mg total) by mouth daily as needed for sleep. 02/10/22   [name], NP  colchicine 0.6 MG tablet Take 0.5 tablets (0.3 mg total) by mouth 2 (two) times daily. 07/24/20 07/29/20  Noemi Chapel, MD  furosemide (LASIX) 40 MG tablet Take 1 tablet (40 mg total) by mouth daily. 07/25/19 02/09/20  Ladona Horns, MD    Scheduled Meds:  Infusions:  PRN Meds:    Allergies as of 05/27/2022 - Review Complete 05/20/2022  Allergen Reaction Noted   Seroquel [quetiapine] Other (See Comments) 04/22/2021   Dilaudid [hydromorphone hcl] Itching and Other (See Comments) 07/29/2020    Family History  Problem Relation Age of Onset   Colon cancer Neg Hx     Social History   Socioeconomic History   Marital status: Single    Spouse name: Not on file   Number of  children: 2   Years of education: 12   Highest education level: 12th grade  Occupational History   Occupation: disabled  Tobacco Use   Smoking status: Every Day    Packs/day: 0.50    Years: 45.00    Total pack years: 22.50    Types: Cigarettes   Smokeless tobacco: Never   Tobacco comments:    Pt left before information given  Vaping Use   Vaping Use: Never used  Substance and Sexual Activity   Alcohol use: Not Currently   Drug use: Not Currently    Types: "Crack" cocaine    Comment: last in 2020   Sexual activity: Yes    Birth control/protection: None  Other Topics Concern   Not on file  Social History Narrative   Goes to dialysis M-W-F, [facility]KA using a wheelchair at present, waiting for prosthetic. Looking for handicapped housing for immediate move in.   Social Determinants of Health   Financial Resource Strain: High Risk (11/01/2021)   Overall Financial Resource Strain (CARDIA)    Difficulty of Paying Living Expenses: Hard  Food Insecurity: No Food Insecurity (04/06/2022)   Hunger Vital Sign    Worried About Running Out of Food in the Last Year: Never true    Ran Out of Food in the Last Year: Never true  Transportation Needs: No Transportation Needs (04/06/2022)   PRAPARE - Hydrologist (Medical): No    Lack of Transportation (Non-Medical): No  Recent Concern: Transportation Needs - Unmet Transportation Needs (03/04/2022)   PRAPARE - Transportation    Lack of Transportation (Medical): Yes    Lack of Transportation (Non-Medical): Yes  Physical Activity: Insufficiently Active (11/01/2021)   Exercise Vital Sign    Days of Exercise per Week: 1 day    Minutes of Exercise per Session: 20 min  Stress: No Stress Concern Present (11/01/2021)   [place]    Feeling of Stress : Not at all  Social Connections: Socially Isolated (11/01/2021)   Social Connection and Isolation Panel [  NHANES]     Frequency of Communication with Friends and Family: More than three times a week    Frequency of Social Gatherings with Friends and Family: More than three times a week    Attends Religious Services: Never    Marine scientist or Organizations: No    Attends Archivist Meetings: Never    Marital Status: Divorced  Human resources officer Violence: Not At Risk (04/06/2022)   Humiliation, Afraid, Rape, and Kick questionnaire    Fear of Current or Ex-Partner: No    Emotionally Abused: No    Physically Abused: No    Sexually Abused: No    REVIEW OF SYSTEMS: Constitutional: No weakness. ENT:  No nose bleeds Pulm: Denies shortness of breath CV:  No palpitations, no LE edema.  GU:  No hematuria, no frequency GI: Denies abdominal pain, nausea, vomiting Heme: Denies unusual or excessive bleeding or bruising Transfusions: On multiple occasions over the past few years. Neuro:  No headaches, no peripheral tingling or numbness Derm:  No itching, no rash or sores.  Endocrine:  No sweats or chills.  No polyuria or dysuria Immunization: Reviewed. Travel:  None beyond local counties in last few months.    PHYSICAL EXAM: Vital signs in last 24 hours: Vitals:   05/27/2022 1353 05/05/2022 1410  BP:  132/76  Pulse: 90 95  Resp:  (!) 26  Temp:    SpO2: 91% 95%   Wt Readings from Last 3 Encounters:  05/03/2022 94.8 kg  05/05/22 94.8 kg  04/08/22 95.8 kg    General: Patient is sitting up in bed.  He was trying to get off the stretcher and is confused.  Did not recognize me though I have seen him several times over the past several months.  Looks comfortable and chronically ill Head: No facial asymmetry or signs of head trauma. Eyes: No scleral icterus or conjunctival pallor Ears: Not hard of hearing Nose: No discharge Mouth:   No blood in the mouth Neck: No masses Lungs: Diminished breath sounds but clear.  No labored breathing or cough at rest Heart: RRR. Abdomen: Soft without  tenderness.  Active bowel sounds.  No distention..   Rectal: Deferred Musc/Skeltl: No joint redness, swelling or gross deformity. Extremities: No CCE.  Status post left BKA, stump site intact. Neurologic: Confused.  Recognizes his name but cannot tell me where he is, what day/year it is or why he is here. Skin: Dry Nodes: No CCE. Psych: Calm, cooperative.  Intake/Output from previous day: No intake/output data recorded. Intake/Output this shift: No intake/output data recorded.  LAB RESULTS: Recent Labs    05/21/2022 1131 05/08/2022 1211 05/13/2022 1239  WBC 4.2  --   --   HGB 7.7* 8.2* 7.8*  HCT 24.7* 24.0* 23.0*  PLT 101*  --   --    BMET Lab Results  Component Value Date   NA 140 05/19/2022   NA 139 05/13/2022   NA 137 05/08/2022   K 5.6 (H) 05/12/2022   K 4.7 05/01/2022   K 4.6 05/12/2022   CL 101 05/10/2022   CL 99 05/17/2022   CL 100 05/02/2022   CO2 21 (L) 05/03/2022   CO2 27 05/02/2022   CO2 23 04/30/2022   GLUCOSE 102 (H) 05/13/2022   GLUCOSE 102 (H) 05/12/2022   GLUCOSE 145 (H) 05/02/2022   BUN 93 (H) 05/05/2022   BUN 78 (H) 05/23/2022   BUN 88 (H) 05/02/2022   CREATININE 12.10 (H) 05/02/2022  CREATININE 10.36 (H) 05/13/2022   CREATININE 6.26 (H) 05/02/2022   CALCIUM 8.6 (L) 05/03/2022   CALCIUM 8.7 (L) 05/02/2022   CALCIUM 8.8 (L) 04/30/2022   LFT Recent Labs    05/27/2022 1131  PROT 8.5*  ALBUMIN 2.0*  AST 131*  ALT 58*  ALKPHOS 112  BILITOT 0.9   PT/INR Lab Results  Component Value Date   INR 1.2 04/17/2022   INR 1.3 (H) 04/16/2022   INR 1.4 (H) 04/15/2022   Hepatitis Panel No results for input(s): "HEPBSAG", "HCVAB", "HEPAIGM", "HEPBIGM" in the last 72 hours. C-Diff No components found for: "CDIFF" Lipase     Component Value Date/Time   LIPASE 57 (H) 04/14/2022 1459    Drugs of Abuse     Component Value Date/Time   LABOPIA NONE DETECTED 03/29/2019 1209   COCAINSCRNUR POSITIVE (A) 03/29/2019 1209   LABBENZ NONE DETECTED  03/29/2019 1209   AMPHETMU NONE DETECTED 03/29/2019 1209   THCU NONE DETECTED 03/29/2019 1209   LABBARB NONE DETECTED 03/29/2019 1209     RADIOLOGY STUDIES: CT Cervical Spine Wo Contrast  Result Date: 05/17/2022 CLINICAL DATA:  Neck trauma.  Intoxicated. EXAM: CT CERVICAL SPINE WITHOUT CONTRAST TECHNIQUE: Multidetector CT imaging of the cervical spine was performed without intravenous contrast. Multiplanar CT image reconstructions were also generated. RADIATION DOSE REDUCTION: This exam was performed according to the departmental dose-optimization program which includes automated exposure control, adjustment of the mA and/or kV according to patient size and/or use of iterative reconstruction technique. COMPARISON:  March 29, 2022 FINDINGS: Alignment: The study is degraded by motion artifact. No significant alignment abnormalities. Skull base and vertebrae: No acute fracture. No primary bone lesion or focal pathologic process. Soft tissues and spinal canal: No prevertebral fluid or swelling. No visible canal hematoma. Disc levels:  Multilevel osteoarthritic changes Upper chest: Large left pleural effusion. Other: None. IMPRESSION: 1. No acute fractures or subluxation of the cervical spine, accounting for significant motion artifact. 2. Multilevel osteoarthritic changes of the cervical spine. 3. Large left pleural effusion. Electronically Signed   By: Fidela Salisbury M.D.   On: 05/25/2022 12:52   CT MAXILLOFACIAL WO CONTRAST  Result Date: 05/26/2022 CLINICAL DATA:  Facial trauma EXAM: CT MAXILLOFACIAL WITHOUT CONTRAST TECHNIQUE: Multidetector CT imaging of the maxillofacial structures was performed. Multiplanar CT image reconstructions were also generated. RADIATION DOSE REDUCTION: This exam was performed according to the departmental dose-optimization program which includes automated exposure control, adjustment of the mA and/or kV according to patient size and/or use of iterative reconstruction  technique. COMPARISON:  CT head 04/15/2022 FINDINGS: Osseous: There is no acute facial bone fracture. There is no evidence of mandibular dislocation. There is no suspicious osseous lesion. Orbits: The globes are intact.  There is no retrobulbar hematoma. Sinuses: Clear. Soft tissues: Unremarkable. Limited intracranial: Assessed on the separately dictated CT head. Other: There are multiple dental caries and periapical lucencies. IMPRESSION: No acute facial bone fracture. Electronically Signed   By: Valetta Mole M.D.   On: 05/26/2022 12:51   CT HEAD WO CONTRAST  Result Date: 05/04/2022 CLINICAL DATA:  Mental status change. EXAM: CT HEAD WITHOUT CONTRAST TECHNIQUE: Contiguous axial images were obtained from the base of the skull through the vertex without intravenous contrast. RADIATION DOSE REDUCTION: This exam was performed according to the departmental dose-optimization program which includes automated exposure control, adjustment of the mA and/or kV according to patient size and/or use of iterative reconstruction technique. COMPARISON:  April 15, 2022 FINDINGS: Brain: No evidence of acute infarction,  hemorrhage, hydrocephalus, extra-axial collection or mass lesion/mass effect. Moderate brain parenchymal volume loss and deep white matter microangiopathy. Vascular: Significant calcific atherosclerotic disease of the vertebral arteries and intra cavernous carotid arteries. Skull: Normal. Negative for fracture or focal lesion. Sinuses/Orbits: No acute finding. Other: None. IMPRESSION: 1. No acute intracranial abnormality. 2. Moderate brain parenchymal volume loss and deep white matter microangiopathy. Electronically Signed   By: Fidela Salisbury M.D.   On: 05/06/2022 12:47   DG Chest Port 1 View  Result Date: 05/23/2022 CLINICAL DATA:  Altered mental status, history of diabetes EXAM: PORTABLE CHEST 1 VIEW COMPARISON:  Chest x-ray April 24, 2022 FINDINGS: Unchanged cardiomegaly. Bilateral reticular  pulmonary opacities. Large left pleural effusion and moderate right pleural effusion. No large pneumothorax. No acute osseous abnormality. Vascular stent overlying the left axilla. The visualized upper abdomen is unremarkable. IMPRESSION: 1. Large left and moderate right pleural effusions. 2. Bilateral reticular pulmonary opacities, likely moderate pulmonary edema. 3. Cardiomegaly. Electronically Signed   By: Beryle Flock M.D.   On: 05/12/2022 12:10      IMPRESSION:     Recurrent acute on chronic anemia.  FOBT +.  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   Though has had duodenal ulcer as recently as April 2023.  At [facility] in July had small nonbleeding esophageal varices, no recurrent ulcers and no visible AVMs.  Unsuccessful attempt at CT angio today due to leaking of contrast out to the IV line  Cirrhosis.  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  [place] diagnosed 07/2021.  Underwent Y90 treatment July, August 2023.  Intended surveillance CT scan for late December never done, not clear if pt missed this or why not done.       ESRD.  MWF dialysis.  Bilateral large to moderate pleural effusions, moderate pulmonary edema.  Room air sats low to mid 90%.  AMS, confusion.  Has become a recurrent issue in recent months.  Ammonia level is 39.  No asterixis on exam.  Thrombocytopenia, noncritical at 101.  PT, INR have not been checked.    PLAN:     Dr. Havery Moros will see the patient, possibly not until tomorrow as there is no urgency here.  He can make decision as to whether to pursue a repeat Skippy versus EGD.  I do not think he needs an Protonix drip .   Admitting physician is ordering Protonix 40 iv bid  Does not need to be n.p.o.    No need for PRBC currently.  Following cbc.      Check inr at next blood draw.     Azucena Freed  05/03/2022, 3:58 PM Phone [phone]

## 2022-05-14 NOTE — ED Notes (Signed)
VAS-Team consult placed for PIV. Pt with restricted extremity.  Pt had PIV in upper arm that extravasated while in CT as reported from [place], per primary RN.  Inside of upper arm red and swollen. Gauze secured to insertion site with coban. Coban removed leaving visible pitting. PIV placed in vessel of upper arm under ultrasound guidance, avoiding vessel with previous insertion.  PIV with good blood return and flushes easily.

## 2022-05-14 NOTE — ED Triage Notes (Signed)
Pt bib ems from HD d/t AMS. Pt arrived to HD altered EMS was unable to verify which SNF pt came from.   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   BP 160/89 Hr 84 O2 91% RA placed on 2L 95% CBG 129

## 2022-05-14 NOTE — Consult Note (Signed)
Reason for Consult: ESRD Referring Physician: Dr. Neva Seat  Chief Complaint: AMS  OP Dialysis Orders:  MWF [facility] 4h  400/A1.5   89.5 kg   2K/2Ca bat   LUE AVF  Heparin none, UFP none - Hectoral 27mg IV q HD - Appears to get IV iron with oncology, no ESA d/t cancer?  Assessment/Plan: ABLA - acute on chronic with evidence of GI bleed.  Known AVM's but some AVM's thought to be beyond the reach of enteroscopy.  GI consulted and have already seen the pt ->  s/p octreotide and on PPI BID.  Transfuse prn.   ESRD - Last treatment on 1/12 full tx leaving at 89.1kg (prior tx on 1/10 for 2h514m) ontinue HD per MWF schedule - using AVF. Plan on HD today with 2 HD nurses tonight.  Hypertension/volume  - CXR opacities and effusions b/l likely overloaded  Anemia of CKD - Multiple transfusions last admission, Appears to get IV iron and ESA w/ w/ oncology but has received Mircera at dialysis (last outpt FKKalevaircera 225 given 1188/32/54 Metabolic bone disease -  Corrected calcium elevated thus VDRA was previously placed on hold - CCa still up on last check. Phosphorus is at goal. Changed calcium carbonate from TID to BID   Medical noncompliance + ETOH- multiple admissions - ongoing issue  Hepatocellular carcinoma - s/p Y90 on 11/28/21. Covid-19 Positive positive 12/17   HPI: RiCHAN SHEAHANs an [age].o. 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 Patient recently admitted with hematemesis seen by GI at that time + COVID with hospital course complicated with delirium and ultimately discharged to a SNF. Patient was noted to be confused at dialysis today.  Patient has h/o recurrent GI bleeding with EGD revealing a duodenal ulcer, AVM's, erosive esophagitis and hyperplastic duodenal polyps, HCV never treated but 9.88 million  copies on PCR 07/2021, HCC w/ cirrhosis 07/2019 Y90 treatment 7/13 and then 8/3. Seen by Dr. DaLoletha Carrownd Dr. CiTheron AristaGI) with some of the AVM's noted to be out of reach of the enteroscopy w/ recommended octreotide + PPI BID  treatment. He is altered but denies abdominal pain, nausea, vomiting, tarry or black stool. In the ED VSS but RR was elevated requiring 2L O2 with BUN/Cr 78/10.36 with glucose 102, albumin 2, Hb 7.7 (9.8 12 days ago).   CXR 1. Large left and moderate right pleural effusions. 2. Bilateral reticular pulmonary opacities, likely moderate pulmonary edema. 3. Cardiomegaly.  ROS Pertinent items are noted in HPI.  Chemistry and CBC: Creatinine  Date/Time Value Ref Range Status  02/06/2022 09:43 AM 6.60 (HH) 0.61 - 1.24 mg/dL Final    Comment:    REPEATED TO VERIFY CRITICAL RESULT CALLED TO, READ BACK BY AND VERIFIED WITH: SHELBRA MELVIN, RN @ [id number] 10.12.2023 BY LAUREN BOWMAN, MLS    01/09/2022 08:10 AM 3.82 (HH) 0.61 - 1.24 mg/dL Final    Comment:    CRITICAL RESULT CALLED TO, READ BACK BY AND VERIFIED WITH: DaArna SnipeRN at [phone] M Prudencio Burly 10/03/2021 09:00 AM 8.09 (HH) 0.61 - 1.24 mg/dL Final    Comment:    CRITICAL RESULT CALLED TO, READ BACK BY AND VERIFIED WITH: SHMilwaukee Surgical Suites LLCELVIN @ 1019 ON 06.08.2023 BY LAUREN BOWMAN   09/26/2021 02:36 PM 7.78 (HH) 0.61 - 1.24  mg/dL Final    Comment:    CRITICAL RESULT CALLED TO, READ BACK BY AND VERIFIED WITH: SHELBRA MELVIN, RN @ 4656 ON 06.01.2023 BY LAUREN BOWMAN, MLS   09/19/2021 10:04 AM 4.67 (HH) 0.61 - 1.24 mg/dL Final  07/18/2021 11:21 AM 5.73 (HH) 0.61 - 1.24 mg/dL Final    Comment:    REPEATED TO VERIFY CRITICAL RESULT CALLED TO, READ BACK BY AND VERIFIED WITH: DIANE BELL RN @ [phone] BY J SCOTTON ON 07/18/21     Creatinine, Ser  Date/Time Value Ref Range Status  05/23/2022 12:39 PM 12.10 (H) 0.61 - 1.24 mg/dL Final  05/05/2022 11:31 AM 10.36 (H) 0.61 - 1.24 mg/dL Final  05/02/2022 07:52 AM 6.26 (H) 0.61 - 1.24  mg/dL Final  04/30/2022 10:09 AM 6.51 (H) 0.61 - 1.24 mg/dL Final    Comment:    DELTA CHECK NOTED  04/28/2022 10:10 PM 4.27 (H) 0.61 - 1.24 mg/dL Final  04/27/2022 12:52 PM 6.13 (H) 0.61 - 1.24 mg/dL Final  04/26/2022 01:13 AM 4.36 (H) 0.61 - 1.24 mg/dL Final  04/24/2022 03:32 AM 5.06 (H) 0.61 - 1.24 mg/dL Final  04/23/2022 06:39 AM 8.69 (H) 0.61 - 1.24 mg/dL Final  04/22/2022 06:20 AM 7.89 (H) 0.61 - 1.24 mg/dL Final  04/21/2022 06:58 AM 6.53 (H) 0.61 - 1.24 mg/dL Final  04/19/2022 06:40 AM 7.04 (H) 0.61 - 1.24 mg/dL Final  04/18/2022 04:07 AM 5.92 (H) 0.61 - 1.24 mg/dL Final  04/17/2022 04:05 AM 8.27 (H) 0.61 - 1.24 mg/dL Final  04/16/2022 03:41 AM 7.20 (H) 0.61 - 1.24 mg/dL Final  04/15/2022 06:46 PM 6.56 (H) 0.61 - 1.24 mg/dL Final  04/15/2022 08:40 AM 5.53 (H) 0.61 - 1.24 mg/dL Final  04/14/2022 02:59 PM 10.05 (H) 0.61 - 1.24 mg/dL Final  04/14/2022 01:23 PM 9.83 (H) 0.61 - 1.24 mg/dL Final  04/13/2022 08:33 PM 8.79 (H) 0.61 - 1.24 mg/dL Final  04/10/2022 03:48 AM 8.62 (H) 0.61 - 1.24 mg/dL Final  04/08/2022 04:14 AM 6.39 (H) 0.61 - 1.24 mg/dL Final  04/07/2022 03:21 AM 10.82 (H) 0.61 - 1.24 mg/dL Final  04/06/2022 08:00 AM 10.02 (H) 0.61 - 1.24 mg/dL Final  04/06/2022 01:56 AM 9.78 (H) 0.61 - 1.24 mg/dL Final  04/03/2022 04:32 AM 6.33 (H) 0.61 - 1.24 mg/dL Final  03/29/2022 06:00 PM 5.41 (H) 0.61 - 1.24 mg/dL Final  03/28/2022 06:31 PM 6.11 (H) 0.61 - 1.24 mg/dL Final    Comment:    DELTA CHECK NOTED  03/26/2022 04:21 AM 4.07 (H) 0.61 - 1.24 mg/dL Final  03/25/2022 01:26 AM 5.13 (H) 0.61 - 1.24 mg/dL Final  03/24/2022 05:20 AM 8.11 (H) 0.61 - 1.24 mg/dL Final  03/23/2022 06:12 AM 6.84 (H) 0.61 - 1.24 mg/dL Final  03/13/2022 09:26 AM 4.44 (HH) 0.61 - 1.24 mg/dL Final    Comment:    CRITICAL RESULT CALLED TO, READ BACK BY AND VERIFIED WITH: KIM SMITH AT 5170 BY HFLYNT B11/16/23   03/11/2022 10:47 AM 5.47 (H) 0.61 - 1.24 mg/dL Final  03/08/2022 12:12 AM 4.59 (H) 0.61 - 1.24  mg/dL Final  02/21/2022 07:21 AM 6.35 (H) 0.61 - 1.24 mg/dL Final  02/20/2022 05:50 AM 4.89 (H) 0.61 - 1.24 mg/dL Final  02/19/2022 10:03 AM 6.07 (H) 0.61 - 1.24 mg/dL Final  02/19/2022 07:41 AM 5.93 (H) 0.61 - 1.24 mg/dL Final  02/18/2022 10:16 AM 4.82 (H) 0.61 - 1.24 mg/dL Final  02/17/2022 04:18 AM 6.92 (H) 0.61 - 1.24 mg/dL Final  02/16/2022 05:42 AM 6.29 (H) 0.61 -  1.24 mg/dL Final  02/15/2022 04:09 AM 4.91 (H) 0.61 - 1.24 mg/dL Final  02/14/2022 09:00 PM 4.81 (H) 0.61 - 1.24 mg/dL Final  02/11/2022 11:21 PM 6.99 (H) 0.61 - 1.24 mg/dL Final  01/31/2022 10:32 AM 8.93 (H) 0.61 - 1.24 mg/dL Final  01/30/2022 02:11 AM 7.60 (H) 0.61 - 1.24 mg/dL Final  01/24/2022 04:50 PM 9.11 (H) 0.61 - 1.24 mg/dL Final  01/21/2022 08:14 AM 4.75 (H) 0.61 - 1.24 mg/dL Final  01/21/2022 12:43 AM 4.15 (H) 0.61 - 1.24 mg/dL Final  01/20/2022 03:38 AM 7.11 (H) 0.61 - 1.24 mg/dL Final  01/06/2022 06:14 PM 3.38 (H) 0.61 - 1.24 mg/dL Final   Recent Labs  Lab 05/04/2022 1131 05/24/2022 1211 05/06/2022 1239  NA 137 139 140  K 4.6 4.7 5.6*  CL 99  --  101  CO2 21*  --   --   GLUCOSE 102*  --  102*  BUN 78*  --  93*  CREATININE 10.36*  --  12.10*  CALCIUM 8.6*  --   --    Recent Labs  Lab 05/19/2022 1131 05/17/2022 1211 05/04/2022 1239  WBC 4.2  --   --   NEUTROABS 2.6  --   --   HGB 7.7* 8.2* 7.8*  HCT 24.7* 24.0* 23.0*  MCV 103.8*  --   --   PLT 101*  --   --    Liver Function Tests: Recent Labs  Lab 05/09/2022 1131  AST 131*  ALT 58*  ALKPHOS 112  BILITOT 0.9  PROT 8.5*  ALBUMIN 2.0*   No results for input(s): "LIPASE", "AMYLASE" in the last 168 hours. Recent Labs  Lab 05/26/2022 1131  AMMONIA 39*   Cardiac Enzymes: No results for input(s): "CKTOTAL", "CKMB", "CKMBINDEX", "TROPONINI" in the last 168 hours. Iron Studies: No results for input(s): "IRON", "TIBC", "TRANSFERRIN", "FERRITIN" in the last 72 hours. PT/INR: '@LABRCNTIP'$ (inr:5)  Xrays/Other Studies: ) Results for orders placed or  performed during the hospital encounter of 05/01/2022 (from the past 48 hour(s))  CBG monitoring, ED     Status: Abnormal   Collection Time: 05/07/2022 11:15 AM  Result Value Ref Range   Glucose-Capillary 101 (H) 70 - 99 mg/dL    Comment: Glucose reference range applies only to samples taken after fasting for at least 8 hours.  Comprehensive metabolic panel     Status: Abnormal   Collection Time: 05/07/2022 11:31 AM  Result Value Ref Range   Sodium 137 135 - 145 mmol/L   Potassium 4.6 3.5 - 5.1 mmol/L   Chloride 99 98 - 111 mmol/L   CO2 21 (L) 22 - 32 mmol/L   Glucose, Bld 102 (H) 70 - 99 mg/dL    Comment: Glucose reference range applies only to samples taken after fasting for at least 8 hours.   BUN 78 (H) 8 - 23 mg/dL   Creatinine, Ser 10.36 (H) 0.61 - 1.24 mg/dL   Calcium 8.6 (L) 8.9 - 10.3 mg/dL   Total Protein 8.5 (H) 6.5 - 8.1 g/dL   Albumin 2.0 (L) 3.5 - 5.0 g/dL   AST 131 (H) 15 - 41 U/L   ALT 58 (H) 0 - 44 U/L   Alkaline Phosphatase 112 38 - 126 U/L   Total Bilirubin 0.9 0.3 - 1.2 mg/dL   GFR, Estimated 5 (L) >60 mL/min    Comment: (NOTE) Calculated using the CKD-EPI Creatinine Equation (2021)    Anion gap 17 (H) 5 - 15    Comment: Performed at [facility]  [facility], [place] [address] [place], [place] [address]  CBC with Differential/Platelet     Status: Abnormal   Collection Time: 05/05/2022 11:31 AM  Result Value Ref Range   WBC 4.2 4.0 - 10.5 K/uL   RBC 2.38 (L) 4.22 - 5.81 MIL/uL   Hemoglobin 7.7 (L) 13.0 - 17.0 g/dL   HCT 24.7 (L) 39.0 - 52.0 %   MCV 103.8 (H) 80.0 - 100.0 fL   MCH 32.4 26.0 - 34.0 pg   MCHC 31.2 30.0 - 36.0 g/dL   RDW 18.7 (H) 11.5 - 15.5 %   Platelets 101 (L) 150 - 400 K/uL    Comment: Immature Platelet Fraction may be clinically indicated, consider ordering this additional test [id number]    nRBC 0.0 0.0 - 0.2 %   Neutrophils Relative % 60 %   Neutro Abs 2.6 1.7 - 7.7 K/uL   Lymphocytes Relative 17 %   Lymphs Abs 0.7 0.7 - 4.0 K/uL   Monocytes  Relative 18 %   Monocytes Absolute 0.8 0.1 - 1.0 K/uL   Eosinophils Relative 3 %   Eosinophils Absolute 0.1 0.0 - 0.5 K/uL   Basophils Relative 1 %   Basophils Absolute 0.0 0.0 - 0.1 K/uL   Immature Granulocytes 1 %   Abs Immature Granulocytes 0.02 0.00 - 0.07 K/uL    Comment: Performed at [facility], [place] [address], [place], [place] [address]  Ammonia     Status: Abnormal   Collection Time: 05/21/2022 11:31 AM  Result Value Ref Range   Ammonia 39 (H) 9 - 35 umol/L    Comment: Performed at [facility], [place] [address], [place], [place] [address]  Troponin I (High Sensitivity)     Status: None   Collection Time: 05/19/2022 11:31 AM  Result Value Ref Range   Troponin I (High Sensitivity) 15 <18 ng/L    Comment: (NOTE) Elevated high sensitivity troponin I (hsTnI) values and significant  changes across serial measurements may suggest ACS but many other  chronic and acute conditions are known to elevate hsTnI results.  Refer to the "Links" section for chest pain algorithms and additional  guidance. Performed at [facility], [place] [address], [place], [place] [address]   Resp panel by RT-PCR (RSV, Flu A&B, Covid) Anterior Nasal Swab     Status: None   Collection Time: 05/01/2022 11:32 AM   Specimen: Anterior Nasal Swab  Result Value Ref Range   SARS Coronavirus 2 by RT PCR NEGATIVE NEGATIVE    Comment: (NOTE) SARS-CoV-2 target nucleic acids are NOT DETECTED.  The SARS-CoV-2 RNA is generally detectable in upper respiratory specimens during the acute phase of infection. The lowest concentration of SARS-CoV-2 viral copies this assay can detect is 138 copies/mL. A negative result does not preclude SARS-Cov-2 infection and should not be used as the sole basis for treatment or other patient management decisions. A negative result may occur with  improper specimen collection/handling, submission of specimen other than nasopharyngeal swab, presence of viral mutation(s) within  the areas targeted by this assay, and inadequate number of viral copies(<138 copies/mL). A negative result must be combined with clinical observations, patient history, and epidemiological information. The expected result is Negative.  Fact Sheet for Patients:  EntrepreneurPulse.com.au  Fact Sheet for Healthcare Providers:  IncredibleEmployment.be  This test is no t yet approved or cleared by the [place] FDA and  has been authorized for detection and/or diagnosis of SARS-CoV-2 by FDA under  an Emergency Use Authorization (EUA). This EUA will remain  in effect (meaning this test can be used) for the duration of the COVID-19 declaration under Section 564(b)(1) of the Act, 21 U.S.C.section 360bbb-3(b)(1), unless the authorization is terminated  or revoked sooner.       Influenza A by PCR NEGATIVE NEGATIVE   Influenza B by PCR NEGATIVE NEGATIVE    Comment: (NOTE) The Xpert Xpress SARS-CoV-2/FLU/RSV plus assay is intended as an aid in the diagnosis of influenza from Nasopharyngeal swab specimens and should not be used as a sole basis for treatment. Nasal washings and aspirates are unacceptable for Xpert Xpress SARS-CoV-2/FLU/RSV testing.  Fact Sheet for Patients: EntrepreneurPulse.com.au  Fact Sheet for Healthcare Providers: IncredibleEmployment.be  This test is not yet approved or cleared by the [place] FDA and has been authorized for detection and/or diagnosis of SARS-CoV-2 by FDA under an Emergency Use Authorization (EUA). This EUA will remain in effect (meaning this test can be used) for the duration of the COVID-19 declaration under Section 564(b)(1) of the Act, 21 U.S.C. section 360bbb-3(b)(1), unless the authorization is terminated or revoked.     Resp Syncytial Virus by PCR NEGATIVE NEGATIVE    Comment: (NOTE) Fact Sheet for Patients: EntrepreneurPulse.com.au  Fact  Sheet for Healthcare Providers: IncredibleEmployment.be  This test is not yet approved or cleared by the [place] FDA and has been authorized for detection and/or diagnosis of SARS-CoV-2 by FDA under an Emergency Use Authorization (EUA). This EUA will remain in effect (meaning this test can be used) for the duration of the COVID-19 declaration under Section 564(b)(1) of the Act, 21 U.S.C. section 360bbb-3(b)(1), unless the authorization is terminated or revoked.  Performed at [facility], [place] [address], [place], [place] [address]   I-Stat venous blood gas, ED     Status: Abnormal   Collection Time: 05/19/2022 12:11 PM  Result Value Ref Range   pH, Ven 7.645 (HH) 7.25 - 7.43   pCO2, Ven 21.7 (L) 44 - 60 mmHg   pO2, Ven 72 (H) 32 - 45 mmHg   Bicarbonate 23.6 20.0 - 28.0 mmol/L   TCO2 24 22 - 32 mmol/L   O2 Saturation 97 %   Acid-Base Excess 3.0 (H) 0.0 - 2.0 mmol/L   Sodium 139 135 - 145 mmol/L   Potassium 4.7 3.5 - 5.1 mmol/L   Calcium, Ion 0.93 (L) 1.15 - 1.40 mmol/L   HCT 24.0 (L) 39.0 - 52.0 %   Hemoglobin 8.2 (L) 13.0 - 17.0 g/dL   Sample type VENOUS    Comment NOTIFIED PHYSICIAN   I-Stat Chem 8, ED     Status: Abnormal   Collection Time: 05/20/2022 12:39 PM  Result Value Ref Range   Sodium 140 135 - 145 mmol/L   Potassium 5.6 (H) 3.5 - 5.1 mmol/L   Chloride 101 98 - 111 mmol/L   BUN 93 (H) 8 - 23 mg/dL   Creatinine, Ser 12.10 (H) 0.61 - 1.24 mg/dL   Glucose, Bld 102 (H) 70 - 99 mg/dL    Comment: Glucose reference range applies only to samples taken after fasting for at least 8 hours.   Calcium, Ion 1.05 (L) 1.15 - 1.40 mmol/L   TCO2 27 22 - 32 mmol/L   Hemoglobin 7.8 (L) 13.0 - 17.0 g/dL   HCT 23.0 (L) 39.0 - 52.0 %  POC occult blood, ED     Status: Abnormal   Collection Time: 05/28/2022  1:06 PM  Result Value Ref Range  Fecal Occult Bld POSITIVE (A) NEGATIVE   CT ABDOMEN PELVIS WO CONTRAST  Result Date: 05/16/2022 CLINICAL DATA:   Acute mesenteric ischemia. EXAM: CT ABDOMEN AND PELVIS WITHOUT CONTRAST TECHNIQUE: Multidetector CT imaging of the abdomen and pelvis was performed following the standard protocol without IV contrast. RADIATION DOSE REDUCTION: This exam was performed according to the departmental dose-optimization program which includes automated exposure control, adjustment of the mA and/or kV according to patient size and/or use of iterative reconstruction technique. COMPARISON:  04/11/2022 FINDINGS: Lower chest: Mild cardiac enlargement. Aortic atherosclerosis and coronary artery calcifications. No pericardial effusion. Visualized portions of the trachea and esophagus are unremarkable. No enlarged axillary, or mediastinal lymph nodes. Large left pleural effusion and moderate right pleural effusion identified. Bilateral atelectasis and airspace disease is noted involving the left upper lobe, left lower lobe, and right middle lobe. Hepatobiliary: Within the limitations of unenhanced technique there is no suspicious liver abnormality. Scattered liver calcifications noted. Gallstone is identified measuring 4 mm. Gallbladder appears decompressed. Pancreas: Unremarkable. No pancreatic ductal dilatation or surrounding inflammatory changes. Spleen: Normal in size without focal abnormality. Adrenals/Urinary Tract: Normal adrenal glands. Small stone within inferior pole of the left kidney is unchanged measuring 4 mm. No nephrolithiasis, hydronephrosis or mass noted. Urinary bladder is unremarkable. Stomach/Bowel: Stomach appears normal. No pathologic dilatation of the large or small bowel loops. No significant bowel wall thickening or inflammation. No signs of pneumatosis. Appendix is not visualized. No pericecal inflammation identified. Vascular/Lymphatic: Extensive aortic atherosclerosis and branch vessel involvement. No signs of portal venous gas. No abdominopelvic adenopathy. Reproductive: Prostate is unremarkable. Other: No free fluid  or fluid collections. No signs of pneumoperitoneum. Musculoskeletal: Spondylosis identified within the lower thoracic spine. No acute or suspicious osseous findings. IMPRESSION: 1. No acute findings identified within the abdomen or pelvis. 2. No signs of bowel wall thickening, pneumatosis or portal venous gas. 3. Large left pleural effusion and moderate right pleural effusion with bilateral atelectasis and airspace disease involving the left upper lobe, left lower lobe, and right middle lobe. 4. Gallstone. 5. Nonobstructing left renal calculus. 6.  Aortic Atherosclerosis (ICD10-I70.0). Electronically Signed   By: Kerby Moors M.D.   On: 05/26/2022 17:01   CT Cervical Spine Wo Contrast  Result Date: 05/19/2022 CLINICAL DATA:  Neck trauma.  Intoxicated. EXAM: CT CERVICAL SPINE WITHOUT CONTRAST TECHNIQUE: Multidetector CT imaging of the cervical spine was performed without intravenous contrast. Multiplanar CT image reconstructions were also generated. RADIATION DOSE REDUCTION: This exam was performed according to the departmental dose-optimization program which includes automated exposure control, adjustment of the mA and/or kV according to patient size and/or use of iterative reconstruction technique. COMPARISON:  March 29, 2022 FINDINGS: Alignment: The study is degraded by motion artifact. No significant alignment abnormalities. Skull base and vertebrae: No acute fracture. No primary bone lesion or focal pathologic process. Soft tissues and spinal canal: No prevertebral fluid or swelling. No visible canal hematoma. Disc levels:  Multilevel osteoarthritic changes Upper chest: Large left pleural effusion. Other: None. IMPRESSION: 1. No acute fractures or subluxation of the cervical spine, accounting for significant motion artifact. 2. Multilevel osteoarthritic changes of the cervical spine. 3. Large left pleural effusion. Electronically Signed   By: Fidela Salisbury M.D.   On: 04/29/2022 12:52   CT  MAXILLOFACIAL WO CONTRAST  Result Date: 05/22/2022 CLINICAL DATA:  Facial trauma EXAM: CT MAXILLOFACIAL WITHOUT CONTRAST TECHNIQUE: Multidetector CT imaging of the maxillofacial structures was performed. Multiplanar CT image reconstructions were also generated. RADIATION DOSE REDUCTION: This exam  was performed according to the departmental dose-optimization program which includes automated exposure control, adjustment of the mA and/or kV according to patient size and/or use of iterative reconstruction technique. COMPARISON:  CT head 04/15/2022 FINDINGS: Osseous: There is no acute facial bone fracture. There is no evidence of mandibular dislocation. There is no suspicious osseous lesion. Orbits: The globes are intact.  There is no retrobulbar hematoma. Sinuses: Clear. Soft tissues: Unremarkable. Limited intracranial: Assessed on the separately dictated CT head. Other: There are multiple dental caries and periapical lucencies. IMPRESSION: No acute facial bone fracture. Electronically Signed   By: Valetta Mole M.D.   On: 05/27/2022 12:51   CT HEAD WO CONTRAST  Result Date: 05/24/2022 CLINICAL DATA:  Mental status change. EXAM: CT HEAD WITHOUT CONTRAST TECHNIQUE: Contiguous axial images were obtained from the base of the skull through the vertex without intravenous contrast. RADIATION DOSE REDUCTION: This exam was performed according to the departmental dose-optimization program which includes automated exposure control, adjustment of the mA and/or kV according to patient size and/or use of iterative reconstruction technique. COMPARISON:  April 15, 2022 FINDINGS: Brain: No evidence of acute infarction, hemorrhage, hydrocephalus, extra-axial collection or mass lesion/mass effect. Moderate brain parenchymal volume loss and deep white matter microangiopathy. Vascular: Significant calcific atherosclerotic disease of the vertebral arteries and intra cavernous carotid arteries. Skull: Normal. Negative for fracture or  focal lesion. Sinuses/Orbits: No acute finding. Other: None. IMPRESSION: 1. No acute intracranial abnormality. 2. Moderate brain parenchymal volume loss and deep white matter microangiopathy. Electronically Signed   By: Fidela Salisbury M.D.   On: 05/20/2022 12:47   DG Chest Port 1 View  Result Date: 05/13/2022 CLINICAL DATA:  Altered mental status, history of diabetes EXAM: PORTABLE CHEST 1 VIEW COMPARISON:  Chest x-ray April 24, 2022 FINDINGS: Unchanged cardiomegaly. Bilateral reticular pulmonary opacities. Large left pleural effusion and moderate right pleural effusion. No large pneumothorax. No acute osseous abnormality. Vascular stent overlying the left axilla. The visualized upper abdomen is unremarkable. IMPRESSION: 1. Large left and moderate right pleural effusions. 2. Bilateral reticular pulmonary opacities, likely moderate pulmonary edema. 3. Cardiomegaly. Electronically Signed   By: Beryle Flock M.D.   On: 05/16/2022 12:10    PMH:   Past Medical History:  Diagnosis Date   Anemia    Diabetes mellitus without complication [facility])    patient denies   Dialysis patient [facility])    End stage chronic kidney disease ([place])    Hypertension    ICH (intracerebral hemorrhage) ([place]) 05/20/2017   PAD (peripheral artery disease) ([facility])    Shoulder pain, left 06/28/2013    PSH:   Past Surgical History:  Procedure Laterality Date   A/V FISTULAGRAM N/A 08/15/2020   Procedure: A/V FISTULAGRAM - Left Upper;  Surgeon: Cherre Robins, MD;  Location: [place] [facility];  Service: Cardiovascular;  Laterality: N/A;   ABDOMINAL AORTOGRAM W/LOWER EXTREMITY N/A 04/08/2021   Procedure: ABDOMINAL AORTOGRAM W/LOWER EXTREMITY;  Surgeon: Waynetta Sandy, MD;  Location: [place] [facility];  Service: Cardiovascular;  Laterality: N/A;   AMPUTATION Left 04/16/2021   Procedure: LEFT BELOW KNEE AMPUTATION;  Surgeon: Angelia Mould, MD;  Location: [place];  Service: Vascular;  Laterality: Left;    AMPUTATION Left 06/17/2021   Procedure: REVISION AMPUTATION BELOW KNEE;  Surgeon: Broadus John, MD;  Location: [place];  Service: Vascular;  Laterality: Left;   APPENDECTOMY     APPLICATION OF WOUND VAC Left 06/17/2021   Procedure: APPLICATION OF WOUND VAC;  Surgeon: Virl Cagey,  Carolann Littler, MD;  Location: [place];  Service: Vascular;  Laterality: Left;   AV FISTULA PLACEMENT Left 08/03/2019   Procedure: LEFT ARM ARTERIOVENOUS (AV) CEPHALIC  FISTULA CREATION;  Surgeon: Waynetta Sandy, MD;  Location: [place];  Service: Vascular;  Laterality: Left;   BIOPSY  06/30/2019   Procedure: BIOPSY;  Surgeon: Ronald Lobo, MD;  Location: [facility] ENDOSCOPY;  Service: Endoscopy;;   BIOPSY  08/02/2019   Procedure: BIOPSY;  Surgeon: Yetta Flock, MD;  Location: [facility] ENDOSCOPY;  Service: Gastroenterology;;   BIOPSY  02/08/2021   Procedure: BIOPSY;  Surgeon: Sharyn Creamer, MD;  Location: [facility] ENDOSCOPY;  Service: Gastroenterology;;   BIOPSY  02/24/2021   Procedure: BIOPSY;  Surgeon: [name], MD;  Location: [facility] ENDOSCOPY;  Service: Gastroenterology;;   BIOPSY  03/26/2021   Procedure: BIOPSY;  Surgeon: Lavena Bullion, DO;  Location: [place];  Service: Gastroenterology;;   BIOPSY  08/06/2021   Procedure: BIOPSY;  Surgeon: [name], MD;  Location: [place];  Service: Gastroenterology;;   BIOPSY  09/15/2021   Procedure: BIOPSY;  Surgeon: Sharyn Creamer, MD;  Location: [place];  Service: Gastroenterology;;   BRONCHIAL BIOPSY  12/26/2021   Procedure: BRONCHIAL BIOPSIES;  Surgeon: Juanito Doom, MD;  Location: [place];  Service: Cardiopulmonary;;   BRONCHIAL WASHINGS  12/26/2021   Procedure: BRONCHIAL WASHINGS;  Surgeon: Juanito Doom, MD;  Location: [place];  Service: Cardiopulmonary;;   COLONOSCOPY  01/23/2012   Procedure: COLONOSCOPY;  Surgeon: Danie Binder, MD;  [facility];  Service: Endoscopy;  Laterality: N/A;  11:10 AM   COLONOSCOPY WITH  PROPOFOL N/A 06/30/2019   Procedure: COLONOSCOPY WITH PROPOFOL;  Surgeon: Ronald Lobo, MD;  Location: [place];  Service: Endoscopy;  Laterality: N/A;   ENTEROSCOPY N/A 08/02/2019   Procedure: ENTEROSCOPY;  Surgeon: Yetta Flock, MD;  Location: [facility] ENDOSCOPY;  Service: Gastroenterology;  Laterality: N/A;   ENTEROSCOPY N/A 02/24/2021   Procedure: ENTEROSCOPY;  Surgeon: [name], MD;  Location: [facility] ENDOSCOPY;  Service: Gastroenterology;  Laterality: N/A;   ENTEROSCOPY N/A 03/26/2021   Procedure: ENTEROSCOPY;  Surgeon: Lavena Bullion, DO;  Location: [place];  Service: Gastroenterology;  Laterality: N/A;   ENTEROSCOPY N/A 07/05/2021   Procedure: ENTEROSCOPY;  Surgeon: Carol Ada, MD;  Location: [place];  Service: Gastroenterology;  Laterality: N/A;   ENTEROSCOPY N/A 08/06/2021   Procedure: ENTEROSCOPY;  Surgeon: [name], MD;  Location: [facility] ENDOSCOPY;  Service: Gastroenterology;  Laterality: N/A;   ENTEROSCOPY N/A 08/24/2021   Procedure: ENTEROSCOPY;  Surgeon: Ladene Artist, MD;  Location: [facility] ENDOSCOPY;  Service: Gastroenterology;  Laterality: N/A;   ENTEROSCOPY N/A 09/15/2021   Procedure: ENTEROSCOPY;  Surgeon: Sharyn Creamer, MD;  Location: [facility] ENDOSCOPY;  Service: Gastroenterology;  Laterality: N/A;   ENTEROSCOPY N/A 10/06/2021   Procedure: ENTEROSCOPY;  Surgeon: [name], MD;  Location: [facility] ENDOSCOPY;  Service: Gastroenterology;  Laterality: N/A;   ENTEROSCOPY N/A 11/14/2021   Procedure: ENTEROSCOPY;  Surgeon: Doran Stabler, MD;  Location: [facility] ENDOSCOPY;  Service: Gastroenterology;  Laterality: N/A;   ESOPHAGOGASTRODUODENOSCOPY N/A 08/10/2020   Procedure: ESOPHAGOGASTRODUODENOSCOPY (EGD);  Surgeon: Jerene Bears, MD;  Location: [facility] ENDOSCOPY;  Service: Gastroenterology;  Laterality: N/A;   ESOPHAGOGASTRODUODENOSCOPY (EGD) WITH PROPOFOL N/A 06/30/2019   Procedure: ESOPHAGOGASTRODUODENOSCOPY (EGD) WITH PROPOFOL;  Surgeon: Ronald Lobo, MD;   Location: [place];  Service: Endoscopy;  Laterality: N/A;   ESOPHAGOGASTRODUODENOSCOPY (EGD) WITH PROPOFOL N/A 01/12/2021   Procedure: ESOPHAGOGASTRODUODENOSCOPY (  EGD) WITH PROPOFOL;  Surgeon: Sharyn Creamer, MD;  Location: [place];  Service: Gastroenterology;  Laterality: N/A;   ESOPHAGOGASTRODUODENOSCOPY (EGD) WITH PROPOFOL N/A 02/08/2021   Procedure: ESOPHAGOGASTRODUODENOSCOPY (EGD) WITH PROPOFOL;  Surgeon: Sharyn Creamer, MD;  Location: [place];  Service: Gastroenterology;  Laterality: N/A;   FISTULA SUPERFICIALIZATION Left 10/17/2019   Procedure: LEFT UPPER EXTREMITY FISTULA REVISION, SIDE BRANCH LIGATION,  AND SUPERFICIALIZATION;  Surgeon: Marty Heck, MD;  Location: [place];  Service: Vascular;  Laterality: Left;   FISTULA SUPERFICIALIZATION Left 66/44/0347   Procedure: PLICATION OF ANEURYSM LEFT ARTERIOVENOUS FISTULA;  Surgeon: Angelia Mould, MD;  Location: [place];  Service: Vascular;  Laterality: Left;   FLEXIBLE SIGMOIDOSCOPY N/A 07/05/2021   Procedure: FLEXIBLE SIGMOIDOSCOPY;  Surgeon: Carol Ada, MD;  Location: [place];  Service: Gastroenterology;  Laterality: N/A;   GIVENS CAPSULE STUDY N/A 06/30/2019   Procedure: GIVENS CAPSULE STUDY;  Surgeon: Ronald Lobo, MD;  Location: [place];  Service: Endoscopy;  Laterality: N/A;   GIVENS CAPSULE STUDY N/A 08/25/2021   Procedure: GIVENS CAPSULE STUDY;  Surgeon: Ladene Artist, MD;  Location: [place];  Service: Gastroenterology;  Laterality: N/A;   GIVENS CAPSULE STUDY N/A 10/06/2021   Procedure: GIVENS CAPSULE STUDY;  Surgeon: [name], MD;  Location: [place];  Service: Gastroenterology;  Laterality: N/A;   HEMOSTASIS CLIP PLACEMENT  08/10/2020   Procedure: HEMOSTASIS CLIP PLACEMENT;  Surgeon: Jerene Bears, MD;  Location: [place];  Service: Gastroenterology;;   HEMOSTASIS CLIP PLACEMENT  01/12/2021   Procedure: HEMOSTASIS CLIP PLACEMENT;  Surgeon: Sharyn Creamer, MD;   Location: [place];  Service: Gastroenterology;;   HEMOSTASIS CONTROL  08/02/2019   Procedure: HEMOSTASIS CONTROL;  Surgeon: Yetta Flock, MD;  Location: [place];  Service: Gastroenterology;;   HOT HEMOSTASIS  02/24/2021   Procedure: HOT HEMOSTASIS (ARGON PLASMA COAGULATION/BICAP);  Surgeon: [name], MD;  Location: [facility] ENDOSCOPY;  Service: Gastroenterology;;   HOT HEMOSTASIS N/A 03/26/2021   Procedure: HOT HEMOSTASIS (ARGON PLASMA COAGULATION/BICAP);  Surgeon: Lavena Bullion, DO;  Location: [facility] ENDOSCOPY;  Service: Gastroenterology;  Laterality: N/A;   HOT HEMOSTASIS N/A 07/05/2021   Procedure: HOT HEMOSTASIS (ARGON PLASMA COAGULATION/BICAP);  Surgeon: Carol Ada, MD;  Location: [place];  Service: Gastroenterology;  Laterality: N/A;   HOT HEMOSTASIS N/A 09/15/2021   Procedure: HOT HEMOSTASIS (ARGON PLASMA COAGULATION/BICAP);  Surgeon: Sharyn Creamer, MD;  Location: [place];  Service: Gastroenterology;  Laterality: N/A;   HOT HEMOSTASIS N/A 10/06/2021   Procedure: HOT HEMOSTASIS (ARGON PLASMA COAGULATION/BICAP);  Surgeon: [name], MD;  Location: [place];  Service: Gastroenterology;  Laterality: N/A;   INCISION AND DRAINAGE ABSCESS N/A 06/29/2016   Procedure: INCISION AND DRAINAGE ABDOMINAL WALL ABSCESS;  Surgeon: Alphonsa Overall, MD;  Location: [facility];  Service: General;  Laterality: N/A;   INSERTION OF DIALYSIS CATHETER Right 08/03/2019   Procedure: INSERTION OF DIALYSIS CATHETER;  Surgeon: Waynetta Sandy, MD;  Location: [place];  Service: Vascular;  Laterality: Right;   INSERTION OF DIALYSIS CATHETER Right 10/22/2019   Procedure: INSERTION OF 23CM TUNNELED DIALYSIS CATHETER RIGHT INTERNAL JUGULAR;  Surgeon: Angelia Mould, MD;  Location: [place];  Service: Vascular;  Laterality: Right;   INSERTION OF DIALYSIS CATHETER Right 08/12/2020   Procedure: INSERTION OF Right internal Jugular TUNNELED  DIALYSIS CATHETER.;  Surgeon: Waynetta Sandy, MD;  Location: [place];  Service: Vascular;  Laterality: Right;   INSERTION OF DIALYSIS CATHETER N/A 04/19/2021   Procedure: INSERTION  OF TUNNELED DIALYSIS CATHETER;  Surgeon: Angelia Mould, MD;  Location: [facility] OR;  Service: Vascular;  Laterality: N/A;   IR 3D INDEPENDENT WKST  11/07/2021   IR ANGIOGRAM SELECTIVE EACH ADDITIONAL VESSEL  11/07/2021   IR ANGIOGRAM SELECTIVE EACH ADDITIONAL VESSEL  11/07/2021   IR ANGIOGRAM SELECTIVE EACH ADDITIONAL VESSEL  11/07/2021   IR ANGIOGRAM SELECTIVE EACH ADDITIONAL VESSEL  11/28/2021   IR ANGIOGRAM VISCERAL SELECTIVE  11/07/2021   IR ANGIOGRAM VISCERAL SELECTIVE  11/28/2021   IR EMBO ARTERIAL NOT HEMORR HEMANG INC GUIDE ROADMAPPING  11/07/2021   IR EMBO TUMOR ORGAN ISCHEMIA INFARCT INC GUIDE ROADMAPPING  11/28/2021   IR PARACENTESIS  02/20/2022   IR RADIOLOGIST EVAL & MGMT  09/12/2021   IR US GUIDE VASC ACCESS LEFT  11/07/2021   IR US GUIDE VASC ACCESS LEFT  11/28/2021   Left heel surgery     LOWER EXTREMITY ANGIOGRAPHY N/A 07/08/2021   Procedure: Lower Extremity Angiography;  Surgeon: Cherre Robins, MD;  Location: [place] CV LAB;  Service: Cardiovascular;  Laterality: N/A;   PENILE BIOPSY N/A 03/26/2020   Procedure: PENILE ULCER DEBRIDEMENT;  Surgeon: Remi Haggard, MD;  Location: [facility];  Service: Urology;  Laterality: N/A;  30 MINS   PERIPHERAL VASCULAR INTERVENTION Right 07/08/2021   Procedure: PERIPHERAL VASCULAR INTERVENTION;  Surgeon: Cherre Robins, MD;  Location: [place] CV LAB;  Service: Cardiovascular;  Laterality: Right;   SCLEROTHERAPY  01/12/2021   Procedure: SCLEROTHERAPY;  Surgeon: Sharyn Creamer, MD;  Location: [facility] ENDOSCOPY;  Service: Gastroenterology;;   Clide Deutscher  02/24/2021   Procedure: Clide Deutscher;  Surgeon: [name], MD;  Location: [place];  Service: Gastroenterology;;   SUBMUCOSAL TATTOO INJECTION  08/24/2021   Procedure: SUBMUCOSAL TATTOO INJECTION;  Surgeon: Ladene Artist, MD;   Location: [place];  Service: Gastroenterology;;   SUBMUCOSAL TATTOO INJECTION  09/15/2021   Procedure: SUBMUCOSAL TATTOO INJECTION;  Surgeon: Sharyn Creamer, MD;  Location: [place];  Service: Gastroenterology;;   VIDEO BRONCHOSCOPY N/A 12/26/2021   Procedure: VIDEO BRONCHOSCOPY WITH FLUORO;  Surgeon: Juanito Doom, MD;  Location: [place];  Service: Cardiopulmonary;  Laterality: N/A;    Allergies:  Allergies  Allergen Reactions   Seroquel [Quetiapine] Other (See Comments)    Tardive kinesia Dystonia    Dilaudid [Hydromorphone Hcl] Itching and Other (See Comments)    Pt reports itchiness after IM injection     Medications:   Prior to Admission medications   Medication Sig Start Date End Date Taking? Authorizing Provider  acetaminophen (TYLENOL) 325 MG tablet Take 2 tablets (650 mg total) by mouth every 6 (six) hours. 05/05/22   Mercy Riding, MD  albuterol (VENTOLIN HFA) 108 (90 Base) MCG/ACT inhaler Inhale 2 puffs into the lungs every 6 (six) hours as needed for wheezing or shortness of breath. 05/05/22   Mercy Riding, MD  amLODipine (NORVASC) 10 MG tablet Take 1 tablet (10 mg total) by mouth daily. 05/06/22   Mercy Riding, MD  atorvastatin (LIPITOR) 10 MG tablet Take 1 tablet (10 mg total) by mouth daily. 05/05/22 08/03/22  Mercy Riding, MD  Calcium Carbonate Antacid (CALCIUM CARBONATE, DOSED IN MG ELEMENTAL CALCIUM,) 1250 MG/5ML SUSP Take 5 mLs (500 mg of elemental calcium total) by mouth every 6 (six) hours as needed for indigestion. 03/29/22   Charlynne Cousins, MD  folic acid (FOLVITE) 1 MG tablet Take 1 tablet (1 mg total) by mouth daily. 05/06/22   Mercy Riding,  MD  lactulose (CHRONULAC) 10 GM/15ML solution Take 30 mLs (20 g total) by mouth 3 (three) times daily as needed for mild constipation. 05/05/22   Mercy Riding, MD  multivitamin (RENA-VIT) TABS tablet Take 1 tablet by mouth daily. 05/05/22   Mercy Riding, MD  pantoprazole (PROTONIX) 40 MG tablet Take 40 mg by mouth  2 (two) times daily before a meal.    [provider]  traZODone (DESYREL) 50 MG tablet Take 1 tablet (50 mg total) by mouth daily as needed for sleep. 02/10/22   [name], NP  colchicine 0.6 MG tablet Take 0.5 tablets (0.3 mg total) by mouth 2 (two) times daily. 07/24/20 07/29/20  Noemi Chapel, MD  furosemide (LASIX) 40 MG tablet Take 1 tablet (40 mg total) by mouth daily. 07/25/19 02/09/20  Ladona Horns, MD    Discontinued Meds:   Medications Discontinued During This Encounter  Medication Reason   sodium chloride 0.9 % bolus 1,000 mL    sodium chloride 0.9 % bolus 500 mL    lactulose (CHRONULAC) 10 GM/15ML solution 20 g     Social History:  reports that he has been smoking cigarettes. He has a 22.50 pack-year smoking history. He has never used smokeless tobacco. He reports that he does not currently use alcohol. He reports that he does not currently use drugs after having used the following drugs: "Crack" cocaine.  Family History:   Family History  Problem Relation Age of Onset   Colon cancer Neg Hx     Blood pressure 136/70, pulse 88, temperature (S) (!) 95.1 F (35.1 C), temperature source Rectal, resp. rate 20, height '5\' 11"'$  (1.803 m), weight 94.8 kg, SpO2 91 %. General adult male in bed in no acute distress, in restraints Lungs clear to auscultation bilaterally but not cooperative Heart S1S2 no rub Abdomen soft nontender distended, no rebound Extremities trace edema; left BKA wound healed Psych no anxiety or agitation  Neuro confused and not answering any questions Access LUE BCF (large fistula) but good bruit        Nour Scalise, Hunt Oris, MD 05/07/2022, 5:58 PM

## 2022-05-14 NOTE — H&P (Signed)
History and Physical   Daniel [name] RCV:[id number] DOB: [birth year] DOA: 05/06/2022  PCP: [name], NP   Patient coming from: Dialysis  Chief Complaint: Altered mental status  HPI: Daniel [name] is a 62 y.o. male with medical history significant of GERD, GI bleed, AVM, anemia, PAD, ESRD on HD, EtOH/HCV cirrhosis, HCC, portal hypertension, diabetes, hypertension, status post left BKA, chronic pain, alcohol and substance use presenting with altered mental status.  History obtained with assistance of chart review and family due to patient's altered mentation.  Patient has had ongoing issues with confusion for the past months.  He was admitted from the middle of December to earlier this month initially with significant hematemesis requiring intubation and ICU admission.  Extubated several days later and with continued confusion and GI bleeding and COVID at that time was discussed with family and patient was made DNR and DNI on 12/24.  He did end up receiving 2 units transfusion on 12/28 and ultimately had confusion despite course complicated by delirium.  Discharged to skilled nursing facility.  Has had significant confusion today at dialysis facility and had a fall as well.  Unclear if he completed significant portion of his dialysis session.  EDP reports some mild improvement in his confusion in the ED.  As above patient family had discussions with providers during recent admission and patient was made DNR/DNI, there was talk at that time of discussing case with palliative care.  I do not see that this has been done yet.  Unable to obtain full review of systems due to patient's encephalopathy.  ED Course: Vital signs in the ED significant for blood pressure in the 102H systolic, respiratory rate in the 20s, requiring 2 L to maintain saturations.  Lab workup included CMP with bicarb of 21, gap of 17, BUN of 78, creatinine of 10.36 in setting of ESRD, glucose 102, calcium 8.6,  protein 8.5, albumin 2.0, AST 131, ALT 58.  CBC with hemoglobin of 7.7 which is down from 9.812 days ago, platelets 101.  FOBT positive.  Troponin negative with repeat pending.  Respiratory panel for flu COVID RSV negative.  Ammonia level mildly elevated at 39.  UDS, urinalysis, urine culture, blood culture, ethanol level pending.  VBG showed alkalosis and 7.64 and pCO2 of 21.7.  Imaging showed chest x-ray with large left and moderate right pleural effusion as well as bilateral pulmonary opacities suspicious for edema.  CT head showed no acute normality.  CT C-spine showed no acute abnormality but did show arthritic changes.  CT maxillofacial studies and no acute abnormality.  CTA GI bleed study was ordered but his IV extravasated and was unable to be performed thus far.  Patient received IV PPI and fentanyl in the ED.  GI has been consulted in the ED.  Review of Systems: Unable to obtain full review of systems due to patient's encephalopathy.  Past Medical History:  Diagnosis Date   Anemia    Diabetes mellitus without complication [facility])    patient denies   Dialysis patient [facility])    End stage chronic kidney disease ([place])    Hypertension    ICH (intracerebral hemorrhage) ([place]) 05/20/2017   PAD (peripheral artery disease) ([facility])    Shoulder pain, left 06/28/2013    Past Surgical History:  Procedure Laterality Date   A/V FISTULAGRAM N/A 08/15/2020   Procedure: A/V FISTULAGRAM - Left Upper;  Surgeon: Cherre Robins, MD;  Location: [place] [facility];  Service: Cardiovascular;  Laterality: N/A;  ABDOMINAL AORTOGRAM W/LOWER EXTREMITY N/A 04/08/2021   Procedure: ABDOMINAL AORTOGRAM W/LOWER EXTREMITY;  Surgeon: Waynetta Sandy, MD;  Location: [place] [facility];  Service: Cardiovascular;  Laterality: N/A;   AMPUTATION Left 04/16/2021   Procedure: LEFT BELOW KNEE AMPUTATION;  Surgeon: Angelia Mould, MD;  Location: [place];  Service: Vascular;  Laterality: Left;   AMPUTATION Left  06/17/2021   Procedure: REVISION AMPUTATION BELOW KNEE;  Surgeon: Broadus John, MD;  Location: [place];  Service: Vascular;  Laterality: Left;   APPENDECTOMY     APPLICATION OF WOUND VAC Left 06/17/2021   Procedure: APPLICATION OF WOUND VAC;  Surgeon: Broadus John, MD;  Location: [place];  Service: Vascular;  Laterality: Left;   AV FISTULA PLACEMENT Left 08/03/2019   Procedure: LEFT ARM ARTERIOVENOUS (AV) CEPHALIC  FISTULA CREATION;  Surgeon: Waynetta Sandy, MD;  Location: [place];  Service: Vascular;  Laterality: Left;   BIOPSY  06/30/2019   Procedure: BIOPSY;  Surgeon: Ronald Lobo, MD;  Location: [place] [facility];  Service: Endoscopy;;   BIOPSY  08/02/2019   Procedure: BIOPSY;  Surgeon: Yetta Flock, MD;  Location: [facility] ENDOSCOPY;  Service: Gastroenterology;;   BIOPSY  02/08/2021   Procedure: BIOPSY;  Surgeon: Sharyn Creamer, MD;  Location: [facility] ENDOSCOPY;  Service: Gastroenterology;;   BIOPSY  02/24/2021   Procedure: BIOPSY;  Surgeon: [name], MD;  Location: [facility] ENDOSCOPY;  Service: Gastroenterology;;   BIOPSY  03/26/2021   Procedure: BIOPSY;  Surgeon: Lavena Bullion, DO;  Location: [place];  Service: Gastroenterology;;   BIOPSY  08/06/2021   Procedure: BIOPSY;  Surgeon: [name], MD;  Location: [employer];  Service: Gastroenterology;;   BIOPSY  09/15/2021   Procedure: BIOPSY;  Surgeon: Sharyn Creamer, MD;  Location: [place];  Service: Gastroenterology;;   BRONCHIAL BIOPSY  12/26/2021   Procedure: BRONCHIAL BIOPSIES;  Surgeon: Juanito Doom, MD;  Location: [place];  Service: Cardiopulmonary;;   BRONCHIAL WASHINGS  12/26/2021   Procedure: BRONCHIAL WASHINGS;  Surgeon: Juanito Doom, MD;  Location: [place];  Service: Cardiopulmonary;;   COLONOSCOPY  01/23/2012   Procedure: COLONOSCOPY;  Surgeon: Danie Binder, MD;  [facility];  Service: Endoscopy;  Laterality: N/A;  11:10 AM   COLONOSCOPY WITH PROPOFOL N/A  06/30/2019   Procedure: COLONOSCOPY WITH PROPOFOL;  Surgeon: Ronald Lobo, MD;  Location: [place];  Service: Endoscopy;  Laterality: N/A;   ENTEROSCOPY N/A 08/02/2019   Procedure: ENTEROSCOPY;  Surgeon: Yetta Flock, MD;  Location: [facility] ENDOSCOPY;  Service: Gastroenterology;  Laterality: N/A;   ENTEROSCOPY N/A 02/24/2021   Procedure: ENTEROSCOPY;  Surgeon: [name], MD;  Location: [facility] ENDOSCOPY;  Service: Gastroenterology;  Laterality: N/A;   ENTEROSCOPY N/A 03/26/2021   Procedure: ENTEROSCOPY;  Surgeon: Lavena Bullion, DO;  Location: [place];  Service: Gastroenterology;  Laterality: N/A;   ENTEROSCOPY N/A 07/05/2021   Procedure: ENTEROSCOPY;  Surgeon: Carol Ada, MD;  Location: [place];  Service: Gastroenterology;  Laterality: N/A;   ENTEROSCOPY N/A 08/06/2021   Procedure: ENTEROSCOPY;  Surgeon: [name], MD;  Location: [facility] ENDOSCOPY;  Service: Gastroenterology;  Laterality: N/A;   ENTEROSCOPY N/A 08/24/2021   Procedure: ENTEROSCOPY;  Surgeon: Ladene Artist, MD;  Location: [facility] ENDOSCOPY;  Service: Gastroenterology;  Laterality: N/A;   ENTEROSCOPY N/A 09/15/2021   Procedure: ENTEROSCOPY;  Surgeon: Sharyn Creamer, MD;  Location: [facility] ENDOSCOPY;  Service: Gastroenterology;  Laterality: N/A;   ENTEROSCOPY N/A 10/06/2021   Procedure: ENTEROSCOPY;  Surgeon: [name], MD;  Location: [place];  Service: Gastroenterology;  Laterality: N/A;   ENTEROSCOPY N/A 11/14/2021   Procedure: ENTEROSCOPY;  Surgeon: Doran Stabler, MD;  Location: [facility] ENDOSCOPY;  Service: Gastroenterology;  Laterality: N/A;   ESOPHAGOGASTRODUODENOSCOPY N/A 08/10/2020   Procedure: ESOPHAGOGASTRODUODENOSCOPY (EGD);  Surgeon: Jerene Bears, MD;  Location: [facility] ENDOSCOPY;  Service: Gastroenterology;  Laterality: N/A;   ESOPHAGOGASTRODUODENOSCOPY (EGD) WITH PROPOFOL N/A 06/30/2019   Procedure: ESOPHAGOGASTRODUODENOSCOPY (EGD) WITH PROPOFOL;  Surgeon: Ronald Lobo, MD;  Location:  [place];  Service: Endoscopy;  Laterality: N/A;   ESOPHAGOGASTRODUODENOSCOPY (EGD) WITH PROPOFOL N/A 01/12/2021   Procedure: ESOPHAGOGASTRODUODENOSCOPY (EGD) WITH PROPOFOL;  Surgeon: Sharyn Creamer, MD;  Location: [place];  Service: Gastroenterology;  Laterality: N/A;   ESOPHAGOGASTRODUODENOSCOPY (EGD) WITH PROPOFOL N/A 02/08/2021   Procedure: ESOPHAGOGASTRODUODENOSCOPY (EGD) WITH PROPOFOL;  Surgeon: Sharyn Creamer, MD;  Location: [place];  Service: Gastroenterology;  Laterality: N/A;   FISTULA SUPERFICIALIZATION Left 10/17/2019   Procedure: LEFT UPPER EXTREMITY FISTULA REVISION, SIDE BRANCH LIGATION,  AND SUPERFICIALIZATION;  Surgeon: Marty Heck, MD;  Location: [place];  Service: Vascular;  Laterality: Left;   FISTULA SUPERFICIALIZATION Left 86/76/1950   Procedure: PLICATION OF ANEURYSM LEFT ARTERIOVENOUS FISTULA;  Surgeon: Angelia Mould, MD;  Location: [place];  Service: Vascular;  Laterality: Left;   FLEXIBLE SIGMOIDOSCOPY N/A 07/05/2021   Procedure: FLEXIBLE SIGMOIDOSCOPY;  Surgeon: Carol Ada, MD;  Location: [place];  Service: Gastroenterology;  Laterality: N/A;   GIVENS CAPSULE STUDY N/A 06/30/2019   Procedure: GIVENS CAPSULE STUDY;  Surgeon: Ronald Lobo, MD;  Location: [place];  Service: Endoscopy;  Laterality: N/A;   GIVENS CAPSULE STUDY N/A 08/25/2021   Procedure: GIVENS CAPSULE STUDY;  Surgeon: Ladene Artist, MD;  Location: [place];  Service: Gastroenterology;  Laterality: N/A;   GIVENS CAPSULE STUDY N/A 10/06/2021   Procedure: GIVENS CAPSULE STUDY;  Surgeon: [name], MD;  Location: [place];  Service: Gastroenterology;  Laterality: N/A;   HEMOSTASIS CLIP PLACEMENT  08/10/2020   Procedure: HEMOSTASIS CLIP PLACEMENT;  Surgeon: Jerene Bears, MD;  Location: [place];  Service: Gastroenterology;;   HEMOSTASIS CLIP PLACEMENT  01/12/2021   Procedure: HEMOSTASIS CLIP PLACEMENT;  Surgeon: Sharyn Creamer, MD;  Location: [place];  Service: Gastroenterology;;   HEMOSTASIS CONTROL  08/02/2019   Procedure: HEMOSTASIS CONTROL;  Surgeon: Yetta Flock, MD;  Location: [place];  Service: Gastroenterology;;   HOT HEMOSTASIS  02/24/2021   Procedure: HOT HEMOSTASIS (ARGON PLASMA COAGULATION/BICAP);  Surgeon: [name], MD;  Location: [facility] ENDOSCOPY;  Service: Gastroenterology;;   HOT HEMOSTASIS N/A 03/26/2021   Procedure: HOT HEMOSTASIS (ARGON PLASMA COAGULATION/BICAP);  Surgeon: Lavena Bullion, DO;  Location: [facility] ENDOSCOPY;  Service: Gastroenterology;  Laterality: N/A;   HOT HEMOSTASIS N/A 07/05/2021   Procedure: HOT HEMOSTASIS (ARGON PLASMA COAGULATION/BICAP);  Surgeon: Carol Ada, MD;  Location: [place];  Service: Gastroenterology;  Laterality: N/A;   HOT HEMOSTASIS N/A 09/15/2021   Procedure: HOT HEMOSTASIS (ARGON PLASMA COAGULATION/BICAP);  Surgeon: Sharyn Creamer, MD;  Location: [place];  Service: Gastroenterology;  Laterality: N/A;   HOT HEMOSTASIS N/A 10/06/2021   Procedure: HOT HEMOSTASIS (ARGON PLASMA COAGULATION/BICAP);  Surgeon: [name], MD;  Location: [place];  Service: Gastroenterology;  Laterality: N/A;   INCISION AND DRAINAGE ABSCESS N/A 06/29/2016   Procedure: INCISION AND DRAINAGE ABDOMINAL WALL ABSCESS;  Surgeon: Alphonsa Overall, MD;  Location: [facility];  Service: General;  Laterality: N/A;   INSERTION OF DIALYSIS CATHETER  Right 08/03/2019   Procedure: INSERTION OF DIALYSIS CATHETER;  Surgeon: Waynetta Sandy, MD;  Location: [place];  Service: Vascular;  Laterality: Right;   INSERTION OF DIALYSIS CATHETER Right 10/22/2019   Procedure: INSERTION OF 23CM TUNNELED DIALYSIS CATHETER RIGHT INTERNAL JUGULAR;  Surgeon: Angelia Mould, MD;  Location: [place];  Service: Vascular;  Laterality: Right;   INSERTION OF DIALYSIS CATHETER Right 08/12/2020   Procedure: INSERTION OF Right internal Jugular TUNNELED  DIALYSIS CATHETER.;  Surgeon: Waynetta Sandy, MD;  Location: [place];  Service: Vascular;  Laterality: Right;   INSERTION OF DIALYSIS CATHETER N/A 04/19/2021   Procedure: INSERTION OF TUNNELED DIALYSIS CATHETER;  Surgeon: Angelia Mould, MD;  Location: [place];  Service: Vascular;  Laterality: N/A;   IR 3D INDEPENDENT WKST  11/07/2021   IR ANGIOGRAM SELECTIVE EACH ADDITIONAL VESSEL  11/07/2021   IR ANGIOGRAM SELECTIVE EACH ADDITIONAL VESSEL  11/07/2021   IR ANGIOGRAM SELECTIVE EACH ADDITIONAL VESSEL  11/07/2021   IR ANGIOGRAM SELECTIVE EACH ADDITIONAL VESSEL  11/28/2021   IR ANGIOGRAM VISCERAL SELECTIVE  11/07/2021   IR ANGIOGRAM VISCERAL SELECTIVE  11/28/2021   IR EMBO ARTERIAL NOT HEMORR HEMANG INC GUIDE ROADMAPPING  11/07/2021   IR EMBO TUMOR ORGAN ISCHEMIA INFARCT INC GUIDE ROADMAPPING  11/28/2021   IR PARACENTESIS  02/20/2022   IR RADIOLOGIST EVAL & MGMT  09/12/2021   IR US GUIDE VASC ACCESS LEFT  11/07/2021   IR US GUIDE VASC ACCESS LEFT  11/28/2021   Left heel surgery     LOWER EXTREMITY ANGIOGRAPHY N/A 07/08/2021   Procedure: Lower Extremity Angiography;  Surgeon: Cherre Robins, MD;  Location: [place] [facility];  Service: Cardiovascular;  Laterality: N/A;   PENILE BIOPSY N/A 03/26/2020   Procedure: PENILE ULCER DEBRIDEMENT;  Surgeon: Remi Haggard, MD;  Location: [facility];  Service: Urology;  Laterality: N/A;  30 MINS   PERIPHERAL VASCULAR INTERVENTION Right 07/08/2021   Procedure: PERIPHERAL VASCULAR INTERVENTION;  Surgeon: Cherre Robins, MD;  Location: [place] [facility];  Service: Cardiovascular;  Laterality: Right;   SCLEROTHERAPY  01/12/2021   Procedure: SCLEROTHERAPY;  Surgeon: Sharyn Creamer, MD;  Location: [facility] ENDOSCOPY;  Service: Gastroenterology;;   Clide Deutscher  02/24/2021   Procedure: Clide Deutscher;  Surgeon: [name], MD;  Location: [place];  Service: Gastroenterology;;   SUBMUCOSAL TATTOO INJECTION  08/24/2021   Procedure: SUBMUCOSAL TATTOO INJECTION;  Surgeon: Ladene Artist, MD;   Location: [place];  Service: Gastroenterology;;   SUBMUCOSAL TATTOO INJECTION  09/15/2021   Procedure: SUBMUCOSAL TATTOO INJECTION;  Surgeon: Sharyn Creamer, MD;  Location: [place];  Service: Gastroenterology;;   VIDEO BRONCHOSCOPY N/A 12/26/2021   Procedure: VIDEO BRONCHOSCOPY WITH FLUORO;  Surgeon: Juanito Doom, MD;  Location: [place];  Service: Cardiopulmonary;  Laterality: N/A;    Social History  reports that he has been smoking cigarettes. He has a 22.50 pack-year smoking history. He has never used smokeless tobacco. He reports that he does not currently use alcohol. He reports that he does not currently use drugs after having used the following drugs: "Crack" cocaine.  Allergies  Allergen Reactions   Seroquel [Quetiapine] Other (See Comments)    Tardive kinesia Dystonia    Dilaudid [Hydromorphone Hcl] Itching and Other (See Comments)    Pt reports itchiness after IM injection     Family History  Problem Relation Age of Onset   Colon cancer Neg Hx   Reviewed on admission  Prior to Admission medications  Medication Sig Start Date End Date Taking? Authorizing Provider  acetaminophen (TYLENOL) 325 MG tablet Take 2 tablets (650 mg total) by mouth every 6 (six) hours. 05/05/22   Mercy Riding, MD  albuterol (VENTOLIN HFA) 108 (90 Base) MCG/ACT inhaler Inhale 2 puffs into the lungs every 6 (six) hours as needed for wheezing or shortness of breath. 05/05/22   Mercy Riding, MD  amLODipine (NORVASC) 10 MG tablet Take 1 tablet (10 mg total) by mouth daily. 05/06/22   Mercy Riding, MD  atorvastatin (LIPITOR) 10 MG tablet Take 1 tablet (10 mg total) by mouth daily. 05/05/22 08/03/22  Mercy Riding, MD  Calcium Carbonate Antacid (CALCIUM CARBONATE, DOSED IN MG ELEMENTAL CALCIUM,) 1250 MG/5ML SUSP Take 5 mLs (500 mg of elemental calcium total) by mouth every 6 (six) hours as needed for indigestion. 03/29/22   Charlynne Cousins, MD  folic acid (FOLVITE) 1 MG tablet Take 1 tablet (1  mg total) by mouth daily. 05/06/22   Mercy Riding, MD  lactulose (CHRONULAC) 10 GM/15ML solution Take 30 mLs (20 g total) by mouth 3 (three) times daily as needed for mild constipation. 05/05/22   Mercy Riding, MD  multivitamin (RENA-VIT) TABS tablet Take 1 tablet by mouth daily. 05/05/22   Mercy Riding, MD  pantoprazole (PROTONIX) 40 MG tablet Take 40 mg by mouth 2 (two) times daily before a meal.    [provider]  traZODone (DESYREL) 50 MG tablet Take 1 tablet (50 mg total) by mouth daily as needed for sleep. 02/10/22   [name], NP  colchicine 0.6 MG tablet Take 0.5 tablets (0.3 mg total) by mouth 2 (two) times daily. 07/24/20 07/29/20  Noemi Chapel, MD  furosemide (LASIX) 40 MG tablet Take 1 tablet (40 mg total) by mouth daily. 07/25/19 02/09/20  Ladona Horns, MD    Physical Exam: Vitals:   05/27/2022 1200 05/24/2022 1300 05/21/2022 1353 05/01/2022 1410  BP: (!) 144/60   132/76  Pulse: 85  90 95  Resp: (!) 23   (!) 26  Temp:  (!) 96.4 F (35.8 C)    TempSrc:  Rectal    SpO2: 92%  91% 95%  Weight:      Height:        Physical Exam Constitutional:      General: He is not in acute distress.    Appearance: Normal appearance.     Comments: Alert but disoriented  HENT:     Head: Normocephalic and atraumatic.     Mouth/Throat:     Mouth: Mucous membranes are moist.     Pharynx: Oropharynx is clear.  Eyes:     Extraocular Movements: Extraocular movements intact.     Pupils: Pupils are equal, round, and reactive to light.  Cardiovascular:     Rate and Rhythm: Normal rate and regular rhythm.     Pulses: Normal pulses.     Heart sounds: Normal heart sounds.  Pulmonary:     Effort: Pulmonary effort is normal. No respiratory distress.     Breath sounds: Decreased breath sounds present.  Abdominal:     General: Bowel sounds are normal. There is no distension.     Palpations: Abdomen is soft.     Tenderness: There is no abdominal tenderness.  Musculoskeletal:         General: No swelling or deformity.     Comments: S/P L BKA  Skin:    General: Skin is warm and dry.  Neurological:  General: No focal deficit present.     Mental Status: He is disoriented.    Labs on Admission: I have personally reviewed following labs and imaging studies  CBC: Recent Labs  Lab 05/25/2022 1131 05/10/2022 1211 05/25/2022 1239  WBC 4.2  --   --   NEUTROABS 2.6  --   --   HGB 7.7* 8.2* 7.8*  HCT 24.7* 24.0* 23.0*  MCV 103.8*  --   --   PLT 101*  --   --     Basic Metabolic Panel: Recent Labs  Lab 05/26/2022 1131 05/26/2022 1211 05/07/2022 1239  NA 137 139 140  K 4.6 4.7 5.6*  CL 99  --  101  CO2 21*  --   --   GLUCOSE 102*  --  102*  BUN 78*  --  93*  CREATININE 10.36*  --  12.10*  CALCIUM 8.6*  --   --     GFR: Estimated Creatinine Clearance: 7.4 mL/min (A) (by C-G formula based on SCr of 12.1 mg/dL (H)).  Liver Function Tests: Recent Labs  Lab 05/12/2022 1131  AST 131*  ALT 58*  ALKPHOS 112  BILITOT 0.9  PROT 8.5*  ALBUMIN 2.0*    Urine analysis:    Component Value Date/Time   COLORURINE YELLOW 12/09/2021 0900   APPEARANCEUR CLEAR 12/09/2021 0900   LABSPEC 1.008 12/09/2021 0900   PHURINE 5.0 12/09/2021 0900   GLUCOSEU NEGATIVE 12/09/2021 0900   HGBUR MODERATE (A) 12/09/2021 0900   BILIRUBINUR NEGATIVE 12/09/2021 0900   KETONESUR NEGATIVE 12/09/2021 0900   PROTEINUR 100 (A) 12/09/2021 0900   UROBILINOGEN 0.2 12/31/2009 0746   NITRITE NEGATIVE 12/09/2021 0900   LEUKOCYTESUR NEGATIVE 12/09/2021 0900    Radiological Exams on Admission: CT Cervical Spine Wo Contrast  Result Date: 05/18/2022 CLINICAL DATA:  Neck trauma.  Intoxicated. EXAM: CT CERVICAL SPINE WITHOUT CONTRAST TECHNIQUE: Multidetector CT imaging of the cervical spine was performed without intravenous contrast. Multiplanar CT image reconstructions were also generated. RADIATION DOSE REDUCTION: This exam was performed according to the departmental dose-optimization program which  includes automated exposure control, adjustment of the mA and/or kV according to patient size and/or use of iterative reconstruction technique. COMPARISON:  March 29, 2022 FINDINGS: Alignment: The study is degraded by motion artifact. No significant alignment abnormalities. Skull base and vertebrae: No acute fracture. No primary bone lesion or focal pathologic process. Soft tissues and spinal canal: No prevertebral fluid or swelling. No visible canal hematoma. Disc levels:  Multilevel osteoarthritic changes Upper chest: Large left pleural effusion. Other: None. IMPRESSION: 1. No acute fractures or subluxation of the cervical spine, accounting for significant motion artifact. 2. Multilevel osteoarthritic changes of the cervical spine. 3. Large left pleural effusion. Electronically Signed   By: Fidela Salisbury M.D.   On: 05/19/2022 12:52   CT MAXILLOFACIAL WO CONTRAST  Result Date: 05/04/2022 CLINICAL DATA:  Facial trauma EXAM: CT MAXILLOFACIAL WITHOUT CONTRAST TECHNIQUE: Multidetector CT imaging of the maxillofacial structures was performed. Multiplanar CT image reconstructions were also generated. RADIATION DOSE REDUCTION: This exam was performed according to the departmental dose-optimization program which includes automated exposure control, adjustment of the mA and/or kV according to patient size and/or use of iterative reconstruction technique. COMPARISON:  CT head 04/15/2022 FINDINGS: Osseous: There is no acute facial bone fracture. There is no evidence of mandibular dislocation. There is no suspicious osseous lesion. Orbits: The globes are intact.  There is no retrobulbar hematoma. Sinuses: Clear. Soft tissues: Unremarkable. Limited intracranial: Assessed on the separately  dictated CT head. Other: There are multiple dental caries and periapical lucencies. IMPRESSION: No acute facial bone fracture. Electronically Signed   By: Valetta Mole M.D.   On: 05/16/2022 12:51   CT HEAD WO CONTRAST  Result  Date: 05/04/2022 CLINICAL DATA:  Mental status change. EXAM: CT HEAD WITHOUT CONTRAST TECHNIQUE: Contiguous axial images were obtained from the base of the skull through the vertex without intravenous contrast. RADIATION DOSE REDUCTION: This exam was performed according to the departmental dose-optimization program which includes automated exposure control, adjustment of the mA and/or kV according to patient size and/or use of iterative reconstruction technique. COMPARISON:  April 15, 2022 FINDINGS: Brain: No evidence of acute infarction, hemorrhage, hydrocephalus, extra-axial collection or mass lesion/mass effect. Moderate brain parenchymal volume loss and deep white matter microangiopathy. Vascular: Significant calcific atherosclerotic disease of the vertebral arteries and intra cavernous carotid arteries. Skull: Normal. Negative for fracture or focal lesion. Sinuses/Orbits: No acute finding. Other: None. IMPRESSION: 1. No acute intracranial abnormality. 2. Moderate brain parenchymal volume loss and deep white matter microangiopathy. Electronically Signed   By: Fidela Salisbury M.D.   On: 05/28/2022 12:47   DG Chest Port 1 View  Result Date: 05/13/2022 CLINICAL DATA:  Altered mental status, history of diabetes EXAM: PORTABLE CHEST 1 VIEW COMPARISON:  Chest x-ray April 24, 2022 FINDINGS: Unchanged cardiomegaly. Bilateral reticular pulmonary opacities. Large left pleural effusion and moderate right pleural effusion. No large pneumothorax. No acute osseous abnormality. Vascular stent overlying the left axilla. The visualized upper abdomen is unremarkable. IMPRESSION: 1. Large left and moderate right pleural effusions. 2. Bilateral reticular pulmonary opacities, likely moderate pulmonary edema. 3. Cardiomegaly. Electronically Signed   By: Beryle Flock M.D.   On: 05/04/2022 12:10    EKG: Independently reviewed.  Sinus rhythm at 88 bpm.  Significant baseline wander.  Mild baseline artifact.   Nonspecific T wave flattening.  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  Noted to have significant confusion and had a fall today at dialysis. > Unclear etiology however he does have significantly elevated BUN and there may be confusion of uremia.  Unclear how much of his dialysis session he had done today.  Per chart review and family patient has had issues with delirium and confusion ongoing for months. > No evidence of infectious etiology in the ED.  May have a degree of hypoxia as he is new bilateral effusions and is new oxygen requirement of 2 L. > Low suspicion for any hepatic cephalopathy as ammonia is only 39 and he is not have significant somnolence. > Do not have suspicion for alcohol use/withdrawal as he has been residing at a skilled nursing facility since discharge. > During last admission there was discussion with family and patient was made DNR/DNI.  There was mention of possible palliative evaluation at that time, does not appear to been done thus far. - Monitor on telemetry - Continue to trend renal function and electrolytes as well as fever curve and WBC - Will continue with folate and multivitamin from outpatient - Hold home trazodone -  Dialysis schedule per nephrology. - Palliative care consult  Anemia GI bleeding > Acute on chronic anemia with hemoglobin 7.7 down from 9.812 days ago.  This is also in the setting of ESRD as above. > FOBT is positive in ED.  Did have some blood in his naris.  History of recent admission  with hematemesis. > Does have known history of cirrhosis and portal hypertensive gastritis as below as well as ulcers and AVMs. > Did not get scoped during last admission as he was critically ill did not seem to be benefit at that time as his hemoglobin stabilized. > GI consulted in the ED - Appreciate GI recommendations - Transfuse hemoglobin less than 7, trend CBC every 8 hours for now - Continue with IV PPI - SCDs - Diet as tolerated for now  Hypoxia > Patient noted to require 2 L in the ED.  This is in the setting of new large left and moderate right pleural effusions.  Could represent volume overload in the setting of ESRD with evidence of pulmonary edema as well.  Could be some component of hydrothorax additionally. - Pending goals of care discussion may benefit from therapeutic thoracentesis on on urgent basis.  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  Also known history of HCC. > Also has had complication of portal hypertension with gastropathy. > Has had some GI bleeding previously and some degree today as above. > Platelets 101, AST 131, ALT 58.  PT and INR not yet checked. - Appreciate GI recommendations - Continue with home lactulose - IV PPI as above - Continue home  ESRD on HD > Session was today, unclear how much was completed. > Currently electrolytes are largely stable however creatinine at 10.36 is higher than his baseline. - Consult nephrology, appreciate recommendations - May need to coordinate any transfusions.  Hypertension - Hold home amlodipine  PAD - Continue home atorvastatin  Diabetes - SSI  DVT prophylaxis: SCDs Code Status:   DNR/DNI,Confirmed with family, Palliative consulted and family informed of this and concepts introduced. Family Communication:  Updated by phone  Disposition Plan:   Patient is from:  SNF/dialysis  Anticipated DC to:  Pending goals of care  Anticipated DC date:  2 to 5  days  Anticipated DC barriers: None  Consults called:  Gastroenterology, nephrology Admission status:  Inpatient, progressive  Severity of Illness: The appropriate patient status for this patient is INPATIENT. Inpatient status is judged to be reasonable and necessary in order to provide the required intensity of service to ensure the patient's safety. The patient's presenting symptoms, physical exam findings, and initial radiographic and laboratory data in the context of their chronic comorbidities is felt to place them at high risk for further clinical deterioration. Furthermore, it is not anticipated that the patient will be medically stable for discharge from the hospital within 2 midnights of admission.   * I certify that at the point of admission it is my clinical judgment that the patient will require inpatient hospital care spanning beyond 2 midnights from the point of admission due to high intensity of service, high risk for further deterioration and high frequency of surveillance required.Marcelyn Bruins MD [facility]  How to contact the [facility] Attending or Consulting provider [place] or covering provider during after hours [place], for this patient?   Check the care team in [facility] and look for a) attending/consulting [facility] provider listed and b) the [place]  team listed Log into [email or web address] and use [place]'s universal password to access. If you do not have the password, please contact the hospital operator. Locate the [facility] provider you are looking for under [facility] and page to a number that you can be directly reached. If you still have difficulty reaching the provider, please page the [facility] (Director on Call) for the Hospitalists listed on amion for assistance.  05/23/2022, 4:12 PM

## 2022-05-14 NOTE — Progress Notes (Signed)
Evaluation after Contrast Extravasation  Patient seen and examined immediately after contrast extravasation while in CT.  Exam: There is no swelling at the right bicep area.  There is no erythema. There is no discoloration. There are no blisters. There are no signs of decreased perfusion of the skin.  It is no warm to touch.  The patient has intact ROM in fingers.  Radial pulse is normal.  Patient with limited, poor venous access.  CT tech was asked to flush/aspirate the right upper arm IV, if any resistance IV to be removed.   Post extravasation order placed.    Keep arm elevated as much as possible.   Armando Gang Kennley Schwandt PA-C 05/13/2022 3:53 PM

## 2022-05-15 ENCOUNTER — Inpatient Hospital Stay: Payer: Medicare Other | Attending: Internal Medicine | Admitting: Hematology

## 2022-05-15 ENCOUNTER — Inpatient Hospital Stay: Payer: Medicare Other

## 2022-05-15 DIAGNOSIS — R4182 Altered mental status, unspecified: Secondary | ICD-10-CM

## 2022-05-15 DIAGNOSIS — J90 Pleural effusion, not elsewhere classified: Secondary | ICD-10-CM

## 2022-05-15 DIAGNOSIS — Z66 Do not resuscitate: Secondary | ICD-10-CM

## 2022-05-15 DIAGNOSIS — K922 Gastrointestinal hemorrhage, unspecified: Secondary | ICD-10-CM | POA: Diagnosis not present

## 2022-05-15 DIAGNOSIS — N19 Unspecified kidney failure: Secondary | ICD-10-CM

## 2022-05-15 DIAGNOSIS — Z515 Encounter for palliative care: Secondary | ICD-10-CM

## 2022-05-15 DIAGNOSIS — E873 Alkalosis: Secondary | ICD-10-CM

## 2022-05-15 DIAGNOSIS — Z7189 Other specified counseling: Secondary | ICD-10-CM

## 2022-05-15 DIAGNOSIS — J9601 Acute respiratory failure with hypoxia: Secondary | ICD-10-CM

## 2022-05-15 DIAGNOSIS — D649 Anemia, unspecified: Secondary | ICD-10-CM

## 2022-05-15 DIAGNOSIS — N186 End stage renal disease: Secondary | ICD-10-CM | POA: Diagnosis not present

## 2022-05-15 DIAGNOSIS — G9340 Encephalopathy, unspecified: Secondary | ICD-10-CM | POA: Diagnosis not present

## 2022-05-15 DIAGNOSIS — E8729 Other acidosis: Secondary | ICD-10-CM

## 2022-05-15 DIAGNOSIS — Z789 Other specified health status: Secondary | ICD-10-CM

## 2022-05-15 LAB — CBC
HCT: 20 % — ABNORMAL LOW (ref 39.0–52.0)
Hemoglobin: 6.7 g/dL — CL (ref 13.0–17.0)
MCH: 32.8 pg (ref 26.0–34.0)
MCHC: 33.5 g/dL (ref 30.0–36.0)
MCV: 98 fL (ref 80.0–100.0)
Platelets: 107 10*3/uL — ABNORMAL LOW (ref 150–400)
RBC: 2.04 MIL/uL — ABNORMAL LOW (ref 4.22–5.81)
RDW: 18.4 % — ABNORMAL HIGH (ref 11.5–15.5)
WBC: 5.1 10*3/uL (ref 4.0–10.5)
nRBC: 0 % (ref 0.0–0.2)

## 2022-05-15 LAB — COMPREHENSIVE METABOLIC PANEL
ALT: 60 U/L — ABNORMAL HIGH (ref 0–44)
AST: 149 U/L — ABNORMAL HIGH (ref 15–41)
Albumin: 1.8 g/dL — ABNORMAL LOW (ref 3.5–5.0)
Alkaline Phosphatase: 102 U/L (ref 38–126)
Anion gap: 14 (ref 5–15)
BUN: 46 mg/dL — ABNORMAL HIGH (ref 8–23)
CO2: 25 mmol/L (ref 22–32)
Calcium: 8.6 mg/dL — ABNORMAL LOW (ref 8.9–10.3)
Chloride: 97 mmol/L — ABNORMAL LOW (ref 98–111)
Creatinine, Ser: 7.27 mg/dL — ABNORMAL HIGH (ref 0.61–1.24)
GFR, Estimated: 8 mL/min — ABNORMAL LOW (ref >60)
Glucose, Bld: 82 mg/dL (ref 70–99)
Potassium: 4.4 mmol/L (ref 3.5–5.1)
Sodium: 136 mmol/L (ref 135–145)
Total Bilirubin: 1.1 mg/dL (ref 0.3–1.2)
Total Protein: 7.7 g/dL (ref 6.5–8.1)

## 2022-05-15 LAB — PREPARE RBC (CROSSMATCH)

## 2022-05-15 MED ORDER — BIOTENE DRY MOUTH MT LIQD: 15.0000 mL | Freq: Two times a day (BID) | OROMUCOSAL | Status: DC

## 2022-05-15 MED ORDER — HALOPERIDOL 1 MG PO TABS: 2.0000 mg | ORAL_TABLET | Freq: Four times a day (QID) | ORAL | Status: DC | PRN

## 2022-05-15 MED ORDER — ONDANSETRON HCL 4 MG/2ML IJ SOLN: 4.0000 mg | Freq: Four times a day (QID) | INTRAMUSCULAR | Status: DC | PRN

## 2022-05-15 MED ORDER — ACETAMINOPHEN 325 MG PO TABS: 650.0000 mg | ORAL_TABLET | Freq: Four times a day (QID) | ORAL | Status: DC | PRN

## 2022-05-15 MED ORDER — HALOPERIDOL LACTATE 5 MG/ML IJ SOLN: 2.0000 mg | Freq: Four times a day (QID) | INTRAMUSCULAR | Status: DC | PRN

## 2022-05-15 MED ORDER — DIPHENHYDRAMINE HCL 50 MG/ML IJ SOLN: 25.0000 mg | INTRAMUSCULAR | Status: DC | PRN

## 2022-05-15 MED ORDER — GLYCOPYRROLATE 0.2 MG/ML IJ SOLN: 0.2000 mg | INTRAMUSCULAR | Status: DC | PRN

## 2022-05-15 MED ORDER — LORAZEPAM 2 MG/ML PO CONC: 1.0000 mg | ORAL | Status: DC | PRN

## 2022-05-15 MED ORDER — LORAZEPAM 2 MG/ML IJ SOLN: 1.0000 mg | Freq: Once | INTRAMUSCULAR | Status: AC

## 2022-05-15 MED ORDER — POLYVINYL ALCOHOL 1.4 % OP SOLN: 1.0000 [drp] | Freq: Four times a day (QID) | OPHTHALMIC | Status: DC | PRN

## 2022-05-15 MED ORDER — GLYCOPYRROLATE 1 MG PO TABS: 1.0000 mg | ORAL_TABLET | ORAL | Status: DC | PRN

## 2022-05-15 MED ORDER — ACETAMINOPHEN 650 MG RE SUPP: 650.0000 mg | Freq: Four times a day (QID) | RECTAL | Status: DC | PRN

## 2022-05-15 MED ORDER — LORAZEPAM 2 MG/ML IJ SOLN
INTRAMUSCULAR | Status: AC
  Administered 2022-05-15: 1 mg via INTRAVENOUS
  Filled 2022-05-15: qty 1

## 2022-05-15 MED ORDER — LORAZEPAM 2 MG/ML IJ SOLN
1.0000 mg | Freq: Four times a day (QID) | INTRAMUSCULAR | Status: DC | PRN
  Administered 2022-05-15: 1 mg via INTRAVENOUS
  Filled 2022-05-15: qty 1

## 2022-05-15 MED ORDER — LORAZEPAM 1 MG PO TABS: 1.0000 mg | ORAL_TABLET | ORAL | Status: DC | PRN

## 2022-05-15 MED ORDER — DIPHENHYDRAMINE HCL 50 MG/ML IJ SOLN: 12.5000 mg | INTRAMUSCULAR | Status: DC | PRN

## 2022-05-15 MED ORDER — HYDROMORPHONE HCL-NACL 50-0.9 MG/50ML-% IV SOLN
1.0000 mg/h | INTRAVENOUS | Status: DC
  Administered 2022-05-15: 1 mg/h via INTRAVENOUS
  Filled 2022-05-15: qty 50

## 2022-05-15 MED ORDER — HYDROMORPHONE BOLUS VIA INFUSION: 0.5000 mg | INTRAVENOUS | Status: DC | PRN

## 2022-05-15 MED ORDER — HALOPERIDOL LACTATE 2 MG/ML PO CONC: 2.0000 mg | Freq: Four times a day (QID) | ORAL | Status: DC | PRN

## 2022-05-15 MED ORDER — ONDANSETRON 4 MG PO TBDP: 4.0000 mg | ORAL_TABLET | Freq: Four times a day (QID) | ORAL | Status: DC | PRN

## 2022-05-15 MED ORDER — MORPHINE 100MG IN NS 100ML (1MG/ML) PREMIX INFUSION
3.0000 mg/h | INTRAVENOUS | Status: DC
  Administered 2022-05-15: 1 mg/h via INTRAVENOUS
  Filled 2022-05-15: qty 100

## 2022-05-15 MED ORDER — SODIUM CHLORIDE 0.9% IV SOLUTION: Freq: Once | INTRAVENOUS | Status: DC

## 2022-05-15 MED ORDER — MORPHINE BOLUS VIA INFUSION: 1.0000 mg | INTRAVENOUS | Status: DC | PRN

## 2022-05-16 LAB — BPAM RBC
Blood Product Expiration Date: 202401252359
Unit Type and Rh: 7300

## 2022-05-16 LAB — TYPE AND SCREEN
ABO/RH(D): B POS
Antibody Screen: NEGATIVE
Unit division: 0

## 2022-05-19 LAB — CULTURE, BLOOD (ROUTINE X 2)
Culture: NO GROWTH
Culture: NO GROWTH
Special Requests: ADEQUATE

## 2022-05-19 LAB — HEPATITIS B SURFACE ANTIBODY, QUANTITATIVE: Hep B S AB Quant (Post): 45.1 m[IU]/mL (ref >9.9)

## 2022-05-29 NOTE — Progress Notes (Incomplete)
Received patient in bed to unit.  Alert and oriented.  Informed consent signed and in chart.   Treatment initiated: *** Treatment completed: ***  Patient tolerated well.  Transported back to the room  Alert, without acute distress.  Hand-off given to patient's nurse.   Access used: fistula Access issues: none  Total UF removed: 1000 Medication(s) given: guaifenesin 57m Post HD VS: *** Post HD weight: unable to get   [facility]

## 2022-05-29 NOTE — IPAL (Addendum)
  Interdisciplinary Goals of Care Family Meeting   Date carried out: 06/05/22  Location of the meeting: Phone conference  Member's involved: Physician and Other:   Sister, [name] and brother Josephina Gip  Durable Power of Attorney or acting medical decision maker: Sister, [name] and brother Josephina Gip  Discussion: We discussed goals of care for [name] who is currently in respiratory distress, using accessory/abdominal muscles to breath and somnolent. RR in 30s and is inaccurate on the telemetry flow sheets. His RN, Mel Almond messaged me this AM to let me know that he continues to be in respiratory distress and thus I assessed him at the bedside.[name] He received dialysis last night but significant improvement has not been seen. Hgb is 6.7. I have contacted his sister and brother separately via phone and both mention that they noted his continued decline when they visited him this past weekend. Both are in agreement with transition to comfort care and Morphine infusion for respiratory distress. His brother is out of town and will not be able to see him. His sister lives in [place] and is aware that he may expire today. Bedside RN has been notified. We are all in agreement with this plan for Mr Tippen.  Code status:   Code Status: DNR   Disposition: In-patient comfort care  Time spent for the meeting: 35 min    Debbe Odea, MD  05-Jun-2022, 8:22 AM

## 2022-05-29 NOTE — ED Notes (Signed)
Patient family states they do no have a funeral home in mind at this time.

## 2022-05-29 NOTE — ED Notes (Signed)
Performed eye care on the patient at this time.

## 2022-05-29 NOTE — Discharge Planning (Signed)
Disposition plan is to transition to comfort care and transfer to [facility] when bed comes available.

## 2022-05-29 NOTE — ED Notes (Signed)
Pt's Talent bumped to 4L due to an increase in work of breahting O2 saturations remain 98%

## 2022-05-29 NOTE — ED Notes (Signed)
Pt's brief and linens changed

## 2022-05-29 NOTE — Progress Notes (Deleted)
Daniel [name]   [phone] [phone]   Clinic Follow up Note   Patient Care Team: [name], NP as PCP - General (Internal Medicine) Lorretta Harp, MD as PCP - Cardiology (Cardiology) Center, [facility], NP as [place], Rosanne Ashing, MD as Consulting Physician (Interventional Radiology)  Date of Service:  02-Jun-2022  CHIEF COMPLAINT: f/u of [place], anemia   CURRENT THERAPY: IV iron as needed -s/p Y90 11/28/21   ASSESSMENT: *** Daniel [name] is a 62 y.o. male with   No problem-specific Assessment & Plan notes found for this encounter.  ***   PLAN:  SUMMARY OF ONCOLOGIC HISTORY: Oncology History  Hepatocellular carcinoma ([place])  02/07/2021 Imaging   CLINICAL DATA:  Abdominal distension   EXAM: CT ABDOMEN AND PELVIS WITHOUT CONTRAST  IMPRESSION: Cholelithiasis with possible mild gallbladder wall thickening. Correlate for symptoms of right upper quadrant pain and consider further evaluation with gallbladder ultrasound.   Otherwise, no acute findings in the abdomen or pelvis.   Aortic aneurysm NOS (ICD10-I71.9).   02/07/2021 Imaging   CLINICAL DATA:  Cholelithiasis. Question cholecystitis. Abdominal distension.   EXAM: ULTRASOUND ABDOMEN LIMITED RIGHT UPPER QUADRANT  IMPRESSION: 1. Cholelithiasis with associated nonspecific gallbladder wall thickening. No pericholecystic fluid or sonographic Percell Miller sign reported. Gallbladder wall thickening can be seen in the setting of chronic liver disease. Correlate with clinical exam for acute cholecystitis. 2. Limited ultrasound to penetration of sonographic waves.   03/13/2021 Imaging   CLINICAL DATA:  62 year old male with generalized abdominal pain and bright red blood in stool x3 days.   EXAM: CT ABDOMEN AND PELVIS WITHOUT CONTRAST  IMPRESSION: 1. Chronic cholelithiasis, with indistinct appearance of  the gallbladder wall similar to the CT last month. However, relatively contracted state of the gallbladder argues against Acute Cholecystitis. But if there is right upper quadrant pain recommend Right Upper Quadrant Ultrasound may be valuable.   2. No other acute or inflammatory process identified in the non-contrast abdomen or pelvis.   3. Diffuse calcified atherosclerosis, Aortic atherosclerosis (ICD10-I70.0). Left nephrolithiasis. Mild large bowel diverticulosis.   07/23/2021 PET scan   IMPRESSION: 1. The ground-glass nodule in the RIGHT lower lobe is not well demonstrated on the nondiagnostic CT portion the PET-CT scan. No hypermetabolic lesion present on the PET data set within the RIGHT lower lobe. 2. Bands of intensely hypermetabolic peripheral atelectasis in the medial RIGHT lower lobe and lateral LEFT lower lobe/lingula. Favor benign benign atelectasis or other inflammatory process. Recommend follow-up CT (6 - 8 weeks) to demonstrate resolution. If pattern persists or increased tissue sampling may be warranted. 3. No metastatic adenopathy or distant metastatic disease. 4. Incidental findings of large central non hypermetabolic lesion in the liver. Recommend MRI of the liver with contrast for further characterization.   08/05/2021 Imaging   CLINICAL DATA:  GI bleed, melena, history of duodenal ulcer, anemia and end-stage renal disease.   EXAM: CT ANGIOGRAPHY ABDOMEN AND PELVIS WITH CONTRAST AND WITHOUT CONTRAST  IMPRESSION: 1. No active bleeding is identified into the gastrointestinal tract during arterial and venous phases of CT imaging. 2. Irregular outpouching along the medial aspect of the proximal duodenum near the expected position of the duodenal bulb or post bulbar duodenum. This is highly suspicious for a focal duodenal ulcer. There is some adjacent edema in the retroperitoneal fat. This may be the source of GI bleeding clinically. 3. Detection of a rounded 4.4 cm  central hepatic mass near the  juncture of segments IVb and VIII as well as a possible adjacent 2.2 cm mass in segment VIII. Main differential considerations are some type of well differentiated carcinoma such as hepatocellular carcinoma or neuroendocrine carcinoma. Metastatic disease or cholangiocarcinoma is not excluded. Further evaluation with MRI of the abdomen with and without contrast is warranted.   08/06/2021 Initial Diagnosis   Hepatocellular carcinoma ([place])   08/07/2021 Imaging   CLINICAL DATA:  Liver mass, history of hepatitis-C   EXAM: MRI ABDOMEN WITHOUT AND WITH CONTRAST (INCLUDING MRCP)  IMPRESSION: 1. Hepatic cirrhosis. 5 cm mass centered in segment 4A is LI-RADS 5, consistent with hepatocellular carcinoma. 2. Adjacent 2 cm mass in the central right hepatic lobe as described, LI-RADS 3. Consider follow-up in 3-6 months. 3. Mild splenomegaly likely secondary to portal hypertension. 4. Trace ascites. 5. Small bilateral pleural effusions. Abdominal wall subcutaneous edema noted.   08/07/2021 Cancer Staging   Staging form: Liver, AJCC 8th Edition - Clinical stage from 08/07/2021: Stage II (cT2, cN0, cM0) - Signed by Truitt Merle, MD on 08/30/2021 Stage prefix: Initial diagnosis      INTERVAL HISTORY: *** Daniel [name] is here for a follow up of [place], anemia  He was last seen by me on 02/06/2022 He presents to the clinic     All other systems were reviewed with the patient and are negative.  MEDICAL HISTORY:  Past Medical History:  Diagnosis Date   Anemia    Diabetes mellitus without complication [facility])    patient denies   Dialysis patient [facility])    End stage chronic kidney disease ([place])    Hypertension    ICH (intracerebral hemorrhage) ([place]) 05/20/2017   PAD (peripheral artery disease) ([facility])    Shoulder pain, left 06/28/2013    SURGICAL HISTORY: Past Surgical History:  Procedure Laterality Date   A/V FISTULAGRAM N/A 08/15/2020   Procedure: A/V FISTULAGRAM -  Left Upper;  Surgeon: Cherre Robins, MD;  Location: [place] [facility];  Service: Cardiovascular;  Laterality: N/A;   ABDOMINAL AORTOGRAM W/LOWER EXTREMITY N/A 04/08/2021   Procedure: ABDOMINAL AORTOGRAM W/LOWER EXTREMITY;  Surgeon: Waynetta Sandy, MD;  Location: [place] [facility];  Service: Cardiovascular;  Laterality: N/A;   AMPUTATION Left 04/16/2021   Procedure: LEFT BELOW KNEE AMPUTATION;  Surgeon: Angelia Mould, MD;  Location: [place];  Service: Vascular;  Laterality: Left;   AMPUTATION Left 06/17/2021   Procedure: REVISION AMPUTATION BELOW KNEE;  Surgeon: Broadus John, MD;  Location: [place];  Service: Vascular;  Laterality: Left;   APPENDECTOMY     APPLICATION OF WOUND VAC Left 06/17/2021   Procedure: APPLICATION OF WOUND VAC;  Surgeon: Broadus John, MD;  Location: [place];  Service: Vascular;  Laterality: Left;   AV FISTULA PLACEMENT Left 08/03/2019   Procedure: LEFT ARM ARTERIOVENOUS (AV) CEPHALIC  FISTULA CREATION;  Surgeon: Waynetta Sandy, MD;  Location: [place];  Service: Vascular;  Laterality: Left;   BIOPSY  06/30/2019   Procedure: BIOPSY;  Surgeon: Ronald Lobo, MD;  Location: [facility] ENDOSCOPY;  Service: Endoscopy;;   BIOPSY  08/02/2019   Procedure: BIOPSY;  Surgeon: Yetta Flock, MD;  Location: [facility] ENDOSCOPY;  Service: Gastroenterology;;   BIOPSY  02/08/2021   Procedure: BIOPSY;  Surgeon: Sharyn Creamer, MD;  Location: [facility] ENDOSCOPY;  Service: Gastroenterology;;   BIOPSY  02/24/2021   Procedure: BIOPSY;  Surgeon: [name], MD;  Location: [facility] ENDOSCOPY;  Service: Gastroenterology;;   BIOPSY  03/26/2021   Procedure: BIOPSY;  Surgeon: Lavena Bullion, DO;  Location: [place];  Service: Gastroenterology;;   BIOPSY  08/06/2021   Procedure: BIOPSY;  Surgeon: [name], MD;  Location: [place];  Service: Gastroenterology;;   BIOPSY  09/15/2021   Procedure: BIOPSY;  Surgeon: Sharyn Creamer, MD;  Location: [place];   Service: Gastroenterology;;   BRONCHIAL BIOPSY  12/26/2021   Procedure: BRONCHIAL BIOPSIES;  Surgeon: Juanito Doom, MD;  Location: [place];  Service: Cardiopulmonary;;   BRONCHIAL WASHINGS  12/26/2021   Procedure: BRONCHIAL WASHINGS;  Surgeon: Juanito Doom, MD;  Location: [place];  Service: Cardiopulmonary;;   COLONOSCOPY  01/23/2012   Procedure: COLONOSCOPY;  Surgeon: Danie Binder, MD;  [facility];  Service: Endoscopy;  Laterality: N/A;  11:10 AM   COLONOSCOPY WITH PROPOFOL N/A 06/30/2019   Procedure: COLONOSCOPY WITH PROPOFOL;  Surgeon: Ronald Lobo, MD;  Location: [place];  Service: Endoscopy;  Laterality: N/A;   ENTEROSCOPY N/A 08/02/2019   Procedure: ENTEROSCOPY;  Surgeon: Yetta Flock, MD;  Location: [facility] ENDOSCOPY;  Service: Gastroenterology;  Laterality: N/A;   ENTEROSCOPY N/A 02/24/2021   Procedure: ENTEROSCOPY;  Surgeon: [name], MD;  Location: [facility] ENDOSCOPY;  Service: Gastroenterology;  Laterality: N/A;   ENTEROSCOPY N/A 03/26/2021   Procedure: ENTEROSCOPY;  Surgeon: Lavena Bullion, DO;  Location: [place];  Service: Gastroenterology;  Laterality: N/A;   ENTEROSCOPY N/A 07/05/2021   Procedure: ENTEROSCOPY;  Surgeon: Carol Ada, MD;  Location: [place];  Service: Gastroenterology;  Laterality: N/A;   ENTEROSCOPY N/A 08/06/2021   Procedure: ENTEROSCOPY;  Surgeon: [name], MD;  Location: [facility] ENDOSCOPY;  Service: Gastroenterology;  Laterality: N/A;   ENTEROSCOPY N/A 08/24/2021   Procedure: ENTEROSCOPY;  Surgeon: Ladene Artist, MD;  Location: [facility] ENDOSCOPY;  Service: Gastroenterology;  Laterality: N/A;   ENTEROSCOPY N/A 09/15/2021   Procedure: ENTEROSCOPY;  Surgeon: Sharyn Creamer, MD;  Location: [facility] ENDOSCOPY;  Service: Gastroenterology;  Laterality: N/A;   ENTEROSCOPY N/A 10/06/2021   Procedure: ENTEROSCOPY;  Surgeon: [name], MD;  Location: [facility] ENDOSCOPY;  Service: Gastroenterology;  Laterality:  N/A;   ENTEROSCOPY N/A 11/14/2021   Procedure: ENTEROSCOPY;  Surgeon: Doran Stabler, MD;  Location: [facility] ENDOSCOPY;  Service: Gastroenterology;  Laterality: N/A;   ESOPHAGOGASTRODUODENOSCOPY N/A 08/10/2020   Procedure: ESOPHAGOGASTRODUODENOSCOPY (EGD);  Surgeon: Jerene Bears, MD;  Location: [facility] ENDOSCOPY;  Service: Gastroenterology;  Laterality: N/A;   ESOPHAGOGASTRODUODENOSCOPY (EGD) WITH PROPOFOL N/A 06/30/2019   Procedure: ESOPHAGOGASTRODUODENOSCOPY (EGD) WITH PROPOFOL;  Surgeon: Ronald Lobo, MD;  Location: [place];  Service: Endoscopy;  Laterality: N/A;   ESOPHAGOGASTRODUODENOSCOPY (EGD) WITH PROPOFOL N/A 01/12/2021   Procedure: ESOPHAGOGASTRODUODENOSCOPY (EGD) WITH PROPOFOL;  Surgeon: Sharyn Creamer, MD;  Location: [place];  Service: Gastroenterology;  Laterality: N/A;   ESOPHAGOGASTRODUODENOSCOPY (EGD) WITH PROPOFOL N/A 02/08/2021   Procedure: ESOPHAGOGASTRODUODENOSCOPY (EGD) WITH PROPOFOL;  Surgeon: Sharyn Creamer, MD;  Location: [place];  Service: Gastroenterology;  Laterality: N/A;   FISTULA SUPERFICIALIZATION Left 10/17/2019   Procedure: LEFT UPPER EXTREMITY FISTULA REVISION, SIDE BRANCH LIGATION,  AND SUPERFICIALIZATION;  Surgeon: Marty Heck, MD;  Location: [place];  Service: Vascular;  Laterality: Left;   FISTULA SUPERFICIALIZATION Left [id number]   Procedure: PLICATION OF ANEURYSM LEFT ARTERIOVENOUS FISTULA;  Surgeon: Angelia Mould, MD;  Location: [place];  Service: Vascular;  Laterality: Left;   FLEXIBLE SIGMOIDOSCOPY N/A 07/05/2021   Procedure: FLEXIBLE SIGMOIDOSCOPY;  Surgeon: Carol Ada, MD;  Location: [place];  Service: Gastroenterology;  Laterality: N/A;  GIVENS CAPSULE STUDY N/A 06/30/2019   Procedure: GIVENS CAPSULE STUDY;  Surgeon: Ronald Lobo, MD;  Location: [facility] ENDOSCOPY;  Service: Endoscopy;  Laterality: N/A;   GIVENS CAPSULE STUDY N/A 08/25/2021   Procedure: GIVENS CAPSULE STUDY;  Surgeon: Ladene Artist, MD;  Location: [place];  Service: Gastroenterology;  Laterality: N/A;   GIVENS CAPSULE STUDY N/A 10/06/2021   Procedure: GIVENS CAPSULE STUDY;  Surgeon: [name], MD;  Location: [place];  Service: Gastroenterology;  Laterality: N/A;   HEMOSTASIS CLIP PLACEMENT  08/10/2020   Procedure: HEMOSTASIS CLIP PLACEMENT;  Surgeon: Jerene Bears, MD;  Location: [place];  Service: Gastroenterology;;   HEMOSTASIS CLIP PLACEMENT  01/12/2021   Procedure: HEMOSTASIS CLIP PLACEMENT;  Surgeon: Sharyn Creamer, MD;  Location: [place];  Service: Gastroenterology;;   HEMOSTASIS CONTROL  08/02/2019   Procedure: HEMOSTASIS CONTROL;  Surgeon: Yetta Flock, MD;  Location: [place];  Service: Gastroenterology;;   HOT HEMOSTASIS  02/24/2021   Procedure: HOT HEMOSTASIS (ARGON PLASMA COAGULATION/BICAP);  Surgeon: [name], MD;  Location: [facility] ENDOSCOPY;  Service: Gastroenterology;;   HOT HEMOSTASIS N/A 03/26/2021   Procedure: HOT HEMOSTASIS (ARGON PLASMA COAGULATION/BICAP);  Surgeon: Lavena Bullion, DO;  Location: [facility] ENDOSCOPY;  Service: Gastroenterology;  Laterality: N/A;   HOT HEMOSTASIS N/A 07/05/2021   Procedure: HOT HEMOSTASIS (ARGON PLASMA COAGULATION/BICAP);  Surgeon: Carol Ada, MD;  Location: [place];  Service: Gastroenterology;  Laterality: N/A;   HOT HEMOSTASIS N/A 09/15/2021   Procedure: HOT HEMOSTASIS (ARGON PLASMA COAGULATION/BICAP);  Surgeon: Sharyn Creamer, MD;  Location: [place];  Service: Gastroenterology;  Laterality: N/A;   HOT HEMOSTASIS N/A 10/06/2021   Procedure: HOT HEMOSTASIS (ARGON PLASMA COAGULATION/BICAP);  Surgeon: [name], MD;  Location: [place];  Service: Gastroenterology;  Laterality: N/A;   INCISION AND DRAINAGE ABSCESS N/A 06/29/2016   Procedure: INCISION AND DRAINAGE ABDOMINAL WALL ABSCESS;  Surgeon: Alphonsa Overall, MD;  Location: [facility];  Service: General;  Laterality: N/A;   INSERTION OF DIALYSIS CATHETER Right 08/03/2019   Procedure:  INSERTION OF DIALYSIS CATHETER;  Surgeon: Waynetta Sandy, MD;  Location: [place];  Service: Vascular;  Laterality: Right;   INSERTION OF DIALYSIS CATHETER Right 10/22/2019   Procedure: INSERTION OF 23CM TUNNELED DIALYSIS CATHETER RIGHT INTERNAL JUGULAR;  Surgeon: Angelia Mould, MD;  Location: [place];  Service: Vascular;  Laterality: Right;   INSERTION OF DIALYSIS CATHETER Right 08/12/2020   Procedure: INSERTION OF Right internal Jugular TUNNELED  DIALYSIS CATHETER.;  Surgeon: Waynetta Sandy, MD;  Location: [place];  Service: Vascular;  Laterality: Right;   INSERTION OF DIALYSIS CATHETER N/A 04/19/2021   Procedure: INSERTION OF TUNNELED DIALYSIS CATHETER;  Surgeon: Angelia Mould, MD;  Location: [place];  Service: Vascular;  Laterality: N/A;   IR 3D INDEPENDENT WKST  11/07/2021   IR ANGIOGRAM SELECTIVE EACH ADDITIONAL VESSEL  11/07/2021   IR ANGIOGRAM SELECTIVE EACH ADDITIONAL VESSEL  11/07/2021   IR ANGIOGRAM SELECTIVE EACH ADDITIONAL VESSEL  11/07/2021   IR ANGIOGRAM SELECTIVE EACH ADDITIONAL VESSEL  11/28/2021   IR ANGIOGRAM VISCERAL SELECTIVE  11/07/2021   IR ANGIOGRAM VISCERAL SELECTIVE  11/28/2021   IR EMBO ARTERIAL NOT HEMORR HEMANG INC GUIDE ROADMAPPING  11/07/2021   IR EMBO TUMOR ORGAN ISCHEMIA INFARCT INC GUIDE ROADMAPPING  11/28/2021   IR PARACENTESIS  02/20/2022   IR RADIOLOGIST EVAL & MGMT  09/12/2021   IR US GUIDE VASC ACCESS LEFT  11/07/2021   IR US GUIDE VASC ACCESS LEFT  11/28/2021  Left heel surgery     LOWER EXTREMITY ANGIOGRAPHY N/A 07/08/2021   Procedure: Lower Extremity Angiography;  Surgeon: Cherre Robins, MD;  Location: [place] [facility];  Service: Cardiovascular;  Laterality: N/A;   PENILE BIOPSY N/A 03/26/2020   Procedure: PENILE ULCER DEBRIDEMENT;  Surgeon: Remi Haggard, MD;  [facility];  Service: Urology;  Laterality: N/A;  30 MINS   PERIPHERAL VASCULAR INTERVENTION Right 07/08/2021   Procedure: PERIPHERAL VASCULAR INTERVENTION;   Surgeon: Cherre Robins, MD;  Location: [place] [facility];  Service: Cardiovascular;  Laterality: Right;   SCLEROTHERAPY  01/12/2021   Procedure: SCLEROTHERAPY;  Surgeon: Sharyn Creamer, MD;  Location: [facility] ENDOSCOPY;  Service: Gastroenterology;;   Clide Deutscher  02/24/2021   Procedure: Clide Deutscher;  Surgeon: [name], MD;  Location: [place];  Service: Gastroenterology;;   SUBMUCOSAL TATTOO INJECTION  08/24/2021   Procedure: SUBMUCOSAL TATTOO INJECTION;  Surgeon: Ladene Artist, MD;  Location: [place];  Service: Gastroenterology;;   SUBMUCOSAL TATTOO INJECTION  09/15/2021   Procedure: SUBMUCOSAL TATTOO INJECTION;  Surgeon: Sharyn Creamer, MD;  Location: [place];  Service: Gastroenterology;;   VIDEO BRONCHOSCOPY N/A 12/26/2021   Procedure: VIDEO BRONCHOSCOPY WITH FLUORO;  Surgeon: Juanito Doom, MD;  Location: [place];  Service: Cardiopulmonary;  Laterality: N/A;    I have reviewed the social history and family history with the patient and they are unchanged from previous note.  ALLERGIES:  is allergic to seroquel [quetiapine] and dilaudid [hydromorphone hcl].  MEDICATIONS:  No current facility-administered medications for this visit.   Current Outpatient Medications  Medication Sig Dispense Refill   acetaminophen (TYLENOL) 325 MG tablet Take 2 tablets (650 mg total) by mouth every 6 (six) hours.     albuterol (VENTOLIN HFA) 108 (90 Base) MCG/ACT inhaler Inhale 2 puffs into the lungs every 6 (six) hours as needed for wheezing or shortness of breath. 8 g 2   amLODipine (NORVASC) 10 MG tablet Take 1 tablet (10 mg total) by mouth daily.     atorvastatin (LIPITOR) 10 MG tablet Take 1 tablet (10 mg total) by mouth daily. 30 tablet 2   Calcium Carbonate Antacid (CALCIUM CARBONATE, DOSED IN MG ELEMENTAL CALCIUM,) 1250 MG/5ML SUSP Take 5 mLs (500 mg of elemental calcium total) by mouth every 6 (six) hours as needed for indigestion. [id number] mL 0   folic acid  (FOLVITE) 1 MG tablet Take 1 tablet (1 mg total) by mouth daily.     lactulose (CHRONULAC) 10 GM/15ML solution Take 30 mLs (20 g total) by mouth 3 (three) times daily as needed for mild constipation. 236 mL 0   multivitamin (RENA-VIT) TABS tablet Take 1 tablet by mouth daily.  0   pantoprazole (PROTONIX) 40 MG tablet Take 40 mg by mouth 2 (two) times daily before a meal.     traZODone (DESYREL) 50 MG tablet Take 1 tablet (50 mg total) by mouth daily as needed for sleep. 90 tablet 0   Facility-Administered Medications Ordered in Other Visits  Medication Dose Route Frequency Provider Last Rate Last Admin   0.9 %  sodium chloride infusion (Manually program via Guardrails IV Fluids)   Intravenous Once Etta Quill, DO   Held at 05-23-22 0507   acetaminophen (TYLENOL) suppository 650 mg  650 mg Rectal Q6H PRN Marcelyn Bruins, MD       LORazepam (ATIVAN) injection 1 mg  1 mg Intravenous Q6H PRN Etta Quill, DO   1 mg at May 23, 2022 [phone]  morphine 138m in NS 1073m(26m63mL) infusion - premix  1 mg/hr Intravenous Continuous RizDebbe OdeaD 1 mL/hr at 04/30/15/2443 1 mg/hr at 01/02-17-2443   sodium chloride flush (NS) 0.9 % injection 3 mL  3 mL Intravenous Q12H MelMarcelyn BruinsD        PHYSICAL EXAMINATION: ECOG PERFORMANCE STATUS: {CHL ONC ECOG PS:WU:[id number] were no vitals filed for this visit. Wt Readings from Last 3 Encounters:  05/23/2022 208 lb 15.9 oz (94.8 kg)  05/05/22 208 lb 15.9 oz (94.8 kg)  04/08/22 211 lb 3.2 oz (95.8 kg)    {Only keep what was examined. If exam not performed, can use .CEXAM } GENERAL:alert, no distress and comfortable SKIN: skin color, texture, turgor are normal, no rashes or significant lesions EYES: normal, Conjunctiva are pink and non-injected, sclera clear {OROPHARYNX:no exudate, no erythema and lips, buccal mucosa, and tongue normal}  NECK: supple, thyroid normal size, non-tender, without nodularity LYMPH:  no palpable lymphadenopathy  in the cervical, axillary {or inguinal} LUNGS: clear to auscultation and percussion with normal breathing effort HEART: regular rate & rhythm and no murmurs and no lower extremity edema ABDOMEN:abdomen soft, non-tender and normal bowel sounds Musculoskeletal:no cyanosis of digits and no clubbing  NEURO: alert & oriented x 3 with fluent speech, no focal motor/sensory deficits  LABORATORY DATA:  I have reviewed the data as listed    Latest Ref Rng & Units 1/12024/02/17 4:02 AM 05/18/2022    7:42 PM 05/25/2022    5:30 PM  CBC  WBC 4.0 - 10.5 K/uL 5.1  4.7    Hemoglobin 13.0 - 17.0 g/dL 6.7  7.1  8.2   Hematocrit 39.0 - 52.0 % 20.0  21.2  24.0   Platelets 150 - 400 K/uL 107  100          Latest Ref Rng & Units 1/12024/02/17 4:02 AM 05/07/2022    5:30 PM 05/02/2022   12:39 PM  CMP  Glucose 70 - 99 mg/dL 82   102   BUN 8 - 23 mg/dL 46   93   Creatinine 0.61 - 1.24 mg/dL 7.27   12.10   Sodium 135 - 145 mmol/L 136  141  140   Potassium 3.5 - 5.1 mmol/L 4.4  4.9  5.6   Chloride 98 - 111 mmol/L 97   101   CO2 22 - 32 mmol/L 25     Calcium 8.9 - 10.3 mg/dL 8.6     Total Protein 6.5 - 8.1 g/dL 7.7     Total Bilirubin 0.3 - 1.2 mg/dL 1.1     Alkaline Phos 38 - 126 U/L 102     AST 15 - 41 U/L 149     ALT 0 - 44 U/L 60         RADIOGRAPHIC STUDIES: I have personally reviewed the radiological images as listed and agreed with the findings in the report. CT ABDOMEN PELVIS WO CONTRAST  Result Date: 05/26/2022 CLINICAL DATA:  Acute mesenteric ischemia. EXAM: CT ABDOMEN AND PELVIS WITHOUT CONTRAST TECHNIQUE: Multidetector CT imaging of the abdomen and pelvis was performed following the standard protocol without IV contrast. RADIATION DOSE REDUCTION: This exam was performed according to the departmental dose-optimization program which includes automated exposure control, adjustment of the mA and/or kV according to patient size and/or use of iterative reconstruction technique. COMPARISON:   04/11/2022 FINDINGS: Lower chest: Mild cardiac enlargement. Aortic atherosclerosis and coronary artery calcifications. No pericardial  effusion. Visualized portions of the trachea and esophagus are unremarkable. No enlarged axillary, or mediastinal lymph nodes. Large left pleural effusion and moderate right pleural effusion identified. Bilateral atelectasis and airspace disease is noted involving the left upper lobe, left lower lobe, and right middle lobe. Hepatobiliary: Within the limitations of unenhanced technique there is no suspicious liver abnormality. Scattered liver calcifications noted. Gallstone is identified measuring 4 mm. Gallbladder appears decompressed. Pancreas: Unremarkable. No pancreatic ductal dilatation or surrounding inflammatory changes. Spleen: Normal in size without focal abnormality. Adrenals/Urinary Tract: Normal adrenal glands. Small stone within inferior pole of the left kidney is unchanged measuring 4 mm. No nephrolithiasis, hydronephrosis or mass noted. Urinary bladder is unremarkable. Stomach/Bowel: Stomach appears normal. No pathologic dilatation of the large or small bowel loops. No significant bowel wall thickening or inflammation. No signs of pneumatosis. Appendix is not visualized. No pericecal inflammation identified. Vascular/Lymphatic: Extensive aortic atherosclerosis and branch vessel involvement. No signs of portal venous gas. No abdominopelvic adenopathy. Reproductive: Prostate is unremarkable. Other: No free fluid or fluid collections. No signs of pneumoperitoneum. Musculoskeletal: Spondylosis identified within the lower thoracic spine. No acute or suspicious osseous findings. IMPRESSION: 1. No acute findings identified within the abdomen or pelvis. 2. No signs of bowel wall thickening, pneumatosis or portal venous gas. 3. Large left pleural effusion and moderate right pleural effusion with bilateral atelectasis and airspace disease involving the left upper lobe, left lower  lobe, and right middle lobe. 4. Gallstone. 5. Nonobstructing left renal calculus. 6.  Aortic Atherosclerosis (ICD10-I70.0). Electronically Signed   By: Kerby Moors M.D.   On: 05/16/2022 17:01   CT Cervical Spine Wo Contrast  Result Date: 05/24/2022 CLINICAL DATA:  Neck trauma.  Intoxicated. EXAM: CT CERVICAL SPINE WITHOUT CONTRAST TECHNIQUE: Multidetector CT imaging of the cervical spine was performed without intravenous contrast. Multiplanar CT image reconstructions were also generated. RADIATION DOSE REDUCTION: This exam was performed according to the departmental dose-optimization program which includes automated exposure control, adjustment of the mA and/or kV according to patient size and/or use of iterative reconstruction technique. COMPARISON:  March 29, 2022 FINDINGS: Alignment: The study is degraded by motion artifact. No significant alignment abnormalities. Skull base and vertebrae: No acute fracture. No primary bone lesion or focal pathologic process. Soft tissues and spinal canal: No prevertebral fluid or swelling. No visible canal hematoma. Disc levels:  Multilevel osteoarthritic changes Upper chest: Large left pleural effusion. Other: None. IMPRESSION: 1. No acute fractures or subluxation of the cervical spine, accounting for significant motion artifact. 2. Multilevel osteoarthritic changes of the cervical spine. 3. Large left pleural effusion. Electronically Signed   By: Fidela Salisbury M.D.   On: 05/13/2022 12:52   CT MAXILLOFACIAL WO CONTRAST  Result Date: 05/17/2022 CLINICAL DATA:  Facial trauma EXAM: CT MAXILLOFACIAL WITHOUT CONTRAST TECHNIQUE: Multidetector CT imaging of the maxillofacial structures was performed. Multiplanar CT image reconstructions were also generated. RADIATION DOSE REDUCTION: This exam was performed according to the departmental dose-optimization program which includes automated exposure control, adjustment of the mA and/or kV according to patient size and/or  use of iterative reconstruction technique. COMPARISON:  CT head 04/15/2022 FINDINGS: Osseous: There is no acute facial bone fracture. There is no evidence of mandibular dislocation. There is no suspicious osseous lesion. Orbits: The globes are intact.  There is no retrobulbar hematoma. Sinuses: Clear. Soft tissues: Unremarkable. Limited intracranial: Assessed on the separately dictated CT head. Other: There are multiple dental caries and periapical lucencies. IMPRESSION: No acute facial bone fracture. Electronically Signed  By: Valetta Mole M.D.   On: 05/25/2022 12:51   CT HEAD WO CONTRAST  Result Date: 05/26/2022 CLINICAL DATA:  Mental status change. EXAM: CT HEAD WITHOUT CONTRAST TECHNIQUE: Contiguous axial images were obtained from the base of the skull through the vertex without intravenous contrast. RADIATION DOSE REDUCTION: This exam was performed according to the departmental dose-optimization program which includes automated exposure control, adjustment of the mA and/or kV according to patient size and/or use of iterative reconstruction technique. COMPARISON:  April 15, 2022 FINDINGS: Brain: No evidence of acute infarction, hemorrhage, hydrocephalus, extra-axial collection or mass lesion/mass effect. Moderate brain parenchymal volume loss and deep white matter microangiopathy. Vascular: Significant calcific atherosclerotic disease of the vertebral arteries and intra cavernous carotid arteries. Skull: Normal. Negative for fracture or focal lesion. Sinuses/Orbits: No acute finding. Other: None. IMPRESSION: 1. No acute intracranial abnormality. 2. Moderate brain parenchymal volume loss and deep white matter microangiopathy. Electronically Signed   By: Fidela Salisbury M.D.   On: 04/30/2022 12:47   DG Chest Port 1 View  Result Date: 04/29/2022 CLINICAL DATA:  Altered mental status, history of diabetes EXAM: PORTABLE CHEST 1 VIEW COMPARISON:  Chest x-ray April 24, 2022 FINDINGS: Unchanged  cardiomegaly. Bilateral reticular pulmonary opacities. Large left pleural effusion and moderate right pleural effusion. No large pneumothorax. No acute osseous abnormality. Vascular stent overlying the left axilla. The visualized upper abdomen is unremarkable. IMPRESSION: 1. Large left and moderate right pleural effusions. 2. Bilateral reticular pulmonary opacities, likely moderate pulmonary edema. 3. Cardiomegaly. Electronically Signed   By: Beryle Flock M.D.   On: 05/27/2022 12:10      No orders of the defined types were placed in this encounter.  All questions were answered. The patient knows to call the clinic with any problems, questions or concerns. No barriers to learning was detected. The total time spent in the appointment was {CHL ONC TIME VISIT - WR:[id number].     Baldemar Friday, CMA 22-May-2022   I, Audry Riles, CMA, am acting as scribe for Truitt Merle, MD.   {Add scribe attestation statement}

## 2022-05-29 NOTE — Consult Note (Signed)
Consultation Note Date: 2022/06/07   Patient Name: Daniel [name]  DOB: [birth year]  MRN: [id number]  Age / Sex: 62 y.o., male  PCP: [name], NP Referring Physician: Debbe Odea, MD  Reason for Consultation: {Reason for Consult:23484}  HPI/Patient Profile: 62 y.o. male  with past medical history of *** admitted on 05/08/2022 with ***.     Clinical Assessment and Goals of Care: I have reviewed medical records including EPIC notes, labs, and imaging. Received report from primary RN - ***   Went to visit patient at bedside - *** family/visitors present. Patient was lying in bed awake, alert, oriented, and able to participate in conversation***. No signs or non-verbal gestures of pain or discomfort noted. No respiratory distress, increased work of breathing, or secretions noted. ***  Met with ***  to discuss diagnosis, prognosis, GOC, EOL wishes, disposition, and options.  I introduced Palliative Medicine as specialized medical care for people living with serious illness. It focuses on providing relief from the symptoms and stress of a serious illness. The goal is to improve quality of life for both the patient and the family.  We discussed a brief life review of the patient as well as functional and nutritional status.   We discussed patient's current illness and what it means in the larger context of patient's on-going co-morbidities.  Natural disease trajectory and expectations at EOL were discussed. I attempted to elicit values and goals of care important to the patient. The difference between aggressive medical intervention and comfort care was considered in light of the patient's goals of care.   Hospice and Palliative Care services outpatient were explained and offered.  Advance directives, concepts specific to code status, artificial feeding and hydration, and rehospitalization were  considered and discussed.  Visit also consisted of discussions dealing with the complex and emotionally intense issues of symptom management and palliative care in the setting of serious and potentially life-threatening illness. Palliative care team will continue to support patient, patient's family, and medical team.  Discussed with patient/family the importance of continued conversation with each other*** and the medical providers regarding overall plan of care and treatment options, ensuring decisions are within the context of the patient's values and GOCs.    Questions and concerns were addressed. The patient/family was encouraged to call with questions and/or concerns. PMT card was provided.   Primary Decision Maker: {Primary Decision IRWER:15400}    SUMMARY OF RECOMMENDATIONS   ***   Code Status/Advance Care Planning: {Palliative Code status:23503}  Palliative Prophylaxis:  {Palliative Prophylaxis:21015}  Additional Recommendations (Limitations, Scope, Preferences): {Recommended Scope and Preferences:21019}  Psycho-social/Spiritual:  Desire for further Chaplaincy support:{YES NO:22349} Created space and opportunity for patient and family*** to express thoughts and feelings regarding patient's current medical situation.  Emotional support and therapeutic listening provided.  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 The following  aspects are pertinent.  Past Medical History:  Diagnosis Date   Anemia    Diabetes mellitus without complication ([place])    patient denies  Dialysis patient [facility])    End stage chronic kidney disease ([place])    Hypertension    ICH (intracerebral hemorrhage) ([place]) 05/20/2017   PAD (peripheral artery disease) (HCC)    Shoulder pain, left 06/28/2013   Social History   Socioeconomic History   Marital status: Single    Spouse name: Not on file   Number of children: 2   Years of education: 12   Highest education level: 12th grade  Occupational History   Occupation: disabled  Tobacco Use   Smoking status: Every Day    Packs/day: 0.50    Years: 45.00    Total pack years: 22.50    Types: Cigarettes   Smokeless tobacco: Never   Tobacco comments:    Pt left before information given  Vaping Use   Vaping Use: Never used  Substance and Sexual Activity   Alcohol use: Not Currently   Drug use: Not Currently    Types: "Crack" cocaine    Comment: last in 2020   Sexual activity: Yes    Birth control/protection: None  Other Topics Concern   Not on file  Social History Narrative   Goes to dialysis M-W-F, [facility] using a wheelchair at present, waiting for prosthetic. Looking for handicapped housing for immediate move in.   Social Determinants of Health   Financial Resource Strain: High Risk (11/01/2021)   Overall Financial Resource Strain (CARDIA)    Difficulty of Paying Living Expenses: Hard  Food Insecurity: No Food Insecurity (04/06/2022)   Hunger Vital Sign    Worried About Running Out of Food in the Last Year: Never true    Ran Out of Food in the Last Year: Never true  Transportation Needs: No Transportation Needs (04/06/2022)   PRAPARE - Hydrologist (Medical): No    Lack of Transportation (Non-Medical): No  Recent Concern: Transportation Needs - Unmet Transportation Needs (03/04/2022)   PRAPARE - Transportation    Lack of Transportation (Medical): Yes     Lack of Transportation (Non-Medical): Yes  Physical Activity: Insufficiently Active (11/01/2021)   Exercise Vital Sign    Days of Exercise per Week: 1 day    Minutes of Exercise per Session: 20 min  Stress: No Stress Concern Present (11/01/2021)   [place]    Feeling of Stress : Not at all  Social Connections: Socially Isolated (11/01/2021)   Social Connection and Isolation Panel [NHANES]    Frequency of Communication with Friends and Family: More than three times a week    Frequency of Social Gatherings with Friends and Family: More than three times a week    Attends Religious Services: Never    Marine scientist or Organizations: No    Attends Music therapist: Never    Marital Status: Divorced   Family History  Problem Relation Age of Onset   Colon cancer Neg Hx    Scheduled Meds:  antiseptic oral rinse  15 mL Topical BID   Continuous Infusions:  HYDROmorphone 1 mg/hr (23-May-2022 1142)   PRN Meds:.acetaminophen **OR** acetaminophen, diphenhydrAMINE, glycopyrrolate **OR** glycopyrrolate **OR** glycopyrrolate, haloperidol **OR** haloperidol **OR** haloperidol lactate, HYDROmorphone, LORazepam **OR** LORazepam, ondansetron **OR** ondansetron (ZOFRAN) IV, polyvinyl alcohol Medications Prior to Admission:  Prior to Admission medications   Medication Sig Start Date End Date Taking? Authorizing Provider  acetaminophen (TYLENOL) 325 MG tablet Take 2 tablets (650 mg total) by mouth every 6 (six) hours. 05/05/22  Yes Mercy Riding,  MD  albuterol (VENTOLIN HFA) 108 (90 Base) MCG/ACT inhaler Inhale 2 puffs into the lungs every 6 (six) hours as needed for wheezing or shortness of breath. 05/05/22  Yes Mercy Riding, MD  amLODipine (NORVASC) 10 MG tablet Take 1 tablet (10 mg total) by mouth daily. 05/06/22  Yes Mercy Riding, MD  atorvastatin (LIPITOR) 10 MG tablet Take 1 tablet (10 mg total) by mouth daily. 05/05/22  08/03/22 Yes Mercy Riding, MD  Calcium Carbonate Antacid (CALCIUM CARBONATE, DOSED IN MG ELEMENTAL CALCIUM,) 1250 MG/5ML SUSP Take 5 mLs (500 mg of elemental calcium total) by mouth every 6 (six) hours as needed for indigestion. 03/29/22  Yes Charlynne Cousins, MD  folic acid (FOLVITE) 1 MG tablet Take 1 tablet (1 mg total) by mouth daily. 05/06/22  Yes Mercy Riding, MD  lactulose (CHRONULAC) 10 GM/15ML solution Take 30 mLs (20 g total) by mouth 3 (three) times daily as needed for mild constipation. 05/05/22  Yes Mercy Riding, MD  multivitamin (RENA-VIT) TABS tablet Take 1 tablet by mouth daily. 05/05/22  Yes Mercy Riding, MD  pantoprazole (PROTONIX) 40 MG tablet Take 40 mg by mouth 2 (two) times daily before a meal.   Yes [provider]  traZODone (DESYREL) 50 MG tablet Take 1 tablet (50 mg total) by mouth daily as needed for sleep. 02/10/22  Yes [name], NP  colchicine 0.6 MG tablet Take 0.5 tablets (0.3 mg total) by mouth 2 (two) times daily. 07/24/20 07/29/20  Noemi Chapel, MD  furosemide (LASIX) 40 MG tablet Take 1 tablet (40 mg total) by mouth daily. 07/25/19 02/09/20  Ladona Horns, MD   Allergies  Allergen Reactions   Seroquel [Quetiapine] Other (See Comments)    Tardive kinesia Dystonia    Dilaudid [Hydromorphone Hcl] Itching and Other (See Comments)    Pt reports itchiness after IM injection    Review of Systems  Physical Exam  Vital Signs: BP (!) 122/48   Pulse 89   Temp 98.6 F (37 C) (Oral)   Resp (!) 29   Ht '5\' 11"'$  (1.803 m)   Wt 94.8 kg   SpO2 (!) 85%   BMI 29.15 kg/m  Pain Scale: 0-10   Pain Score: Asleep   SpO2: SpO2: (!) 85 % O2 Device:SpO2: (!) 85 % O2 Flow Rate: .O2 Flow Rate (L/min): 4 L/min  IO: Intake/output summary:  Intake/Output Summary (Last 24 hours) at 05-19-2022 1225 Last data filed at May 19, 2022 1143 Gross per 24 hour  Intake 7.68 ml  Output 1000 ml  Net -992.32 ml    LBM:   Baseline Weight: Weight: 94.8 kg Most recent  weight: Weight: 94.8 kg     Palliative Assessment/Data:     Time In: *** Time Out: *** Time Total: *** Greater than 50%  of this time was spent counseling and coordinating care related to the above assessment and plan.  Signed by: Lin Landsman, NP   Please contact Palliative Medicine Team phone at [phone] for questions and concerns.  For individual provider: See Amion  *Portions of this note are a verbal dictation therefore any spelling and/or grammatical errors are due to the "[place] [employer]" system interpretation.

## 2022-05-29 NOTE — Death Summary Note (Signed)
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)   Essential hypertension   PAD (peripheral artery disease) ([place])   Hepatocellular carcinoma (HCC)   GERD without esophagitis   Anemia due to chronic kidney disease and iron deficiency anemia   Diabetes mellitus type 2, controlled, with complications (HCC)   Cocaine abuse (HCC)   Tobacco use disorder   Chronic pain disorder   Hx of BKA, left (HCC)   Cognitive decline   Protein-calorie malnutrition, severe     This was a 62 year old male who is wheelchair-bound, with numerous ED visits in Aug 07, 2021 who is now residing at [facility] with with end-stage renal disease, COVID infection in 12/18 with pneumonia and pulmonary edema requiring intubation, medical noncompliance, alcohol abuse hepatitis C cirrhosis, hepatocellular cancer, peripheral artery disease with a right SFA stenting, left BKA, multiple hospital admissions for GI bleed, Barrett's esophagus, duodenal AVM, junctional AVM, severe protein calorie malnutrition, ongoing issues with agitation who was brought to the ED from dialysis because he was severely agitated.  In the ED he became acutely hypoxic with pulse ox in the 80s.  CT scan revealed bilateral pleural effusions noted to be large on the left and moderate on the right and bilateral reticular  pulmonary opacities due to pulmonary edema he was taken for dialysis.    By the following morning the patient was lethargic and continued to be tachypneic with accessory muscle use.  Hemoglobin was 6.7.  No bleeding noted on exam.  Further discussions between myself and his sister and his brothers were completed and it was decided that as the patient was continuing to deteriorate despite residing in a SNF over the past few weeks, that it was time to transition him to comfort care.  Due to his respiratory distress, a morphine infusion was initiated and the patient expired at 1527.     The results of significant diagnostics from this hospitalization (including imaging, microbiology, ancillary and laboratory) are listed below for reference.   Significant Diagnostic Studies: CT ABDOMEN PELVIS WO CONTRAST  Result Date: 2022-06-13 CLINICAL DATA:  Acute mesenteric ischemia. EXAM: CT ABDOMEN AND PELVIS WITHOUT CONTRAST TECHNIQUE: Multidetector CT imaging of the abdomen and pelvis was performed following the standard protocol without IV contrast. RADIATION DOSE REDUCTION: This exam was performed according to the departmental dose-optimization program which includes automated exposure control, adjustment of the mA and/or kV according to patient size and/or use of iterative reconstruction technique. COMPARISON:  04/11/2022 FINDINGS: Lower chest: Mild cardiac enlargement. Aortic atherosclerosis and coronary artery calcifications. No pericardial effusion. Visualized portions of the trachea and esophagus are unremarkable. No enlarged axillary, or mediastinal lymph nodes. Large left pleural effusion and moderate right pleural effusion identified. Bilateral atelectasis and airspace disease is noted involving the left upper lobe, left lower lobe, and right middle lobe. Hepatobiliary: Within the limitations of unenhanced technique there is no suspicious liver  abnormality. Scattered liver calcifications noted. Gallstone is  identified measuring 4 mm. Gallbladder appears decompressed. Pancreas: Unremarkable. No pancreatic ductal dilatation or surrounding inflammatory changes. Spleen: Normal in size without focal abnormality. Adrenals/Urinary Tract: Normal adrenal glands. Small stone within inferior pole of the left kidney is unchanged measuring 4 mm. No nephrolithiasis, hydronephrosis or mass noted. Urinary bladder is unremarkable. Stomach/Bowel: Stomach appears normal. No pathologic dilatation of the large or small bowel loops. No significant bowel wall thickening or inflammation. No signs of pneumatosis. Appendix is not visualized. No pericecal inflammation identified. Vascular/Lymphatic: Extensive aortic atherosclerosis and branch vessel involvement. No signs of portal venous gas. No abdominopelvic adenopathy. Reproductive: Prostate is unremarkable. Other: No free fluid or fluid collections. No signs of pneumoperitoneum. Musculoskeletal: Spondylosis identified within the lower thoracic spine. No acute or suspicious osseous findings. IMPRESSION: 1. No acute findings identified within the abdomen or pelvis. 2. No signs of bowel wall thickening, pneumatosis or portal venous gas. 3. Large left pleural effusion and moderate right pleural effusion with bilateral atelectasis and airspace disease involving the left upper lobe, left lower lobe, and right middle lobe. 4. Gallstone. 5. Nonobstructing left renal calculus. 6.  Aortic Atherosclerosis (ICD10-I70.0). Electronically Signed   By: Kerby Moors M.D.   On: 05/17/2022 17:01   CT Cervical Spine Wo Contrast  Result Date: 05/25/2022 CLINICAL DATA:  Neck trauma.  Intoxicated. EXAM: CT CERVICAL SPINE WITHOUT CONTRAST TECHNIQUE: Multidetector CT imaging of the cervical spine was performed without intravenous contrast. Multiplanar CT image reconstructions were also generated. RADIATION DOSE REDUCTION: This exam was performed according to the departmental dose-optimization program which  includes automated exposure control, adjustment of the mA and/or kV according to patient size and/or use of iterative reconstruction technique. COMPARISON:  March 29, 2022 FINDINGS: Alignment: The study is degraded by motion artifact. No significant alignment abnormalities. Skull base and vertebrae: No acute fracture. No primary bone lesion or focal pathologic process. Soft tissues and spinal canal: No prevertebral fluid or swelling. No visible canal hematoma. Disc levels:  Multilevel osteoarthritic changes Upper chest: Large left pleural effusion. Other: None. IMPRESSION: 1. No acute fractures or subluxation of the cervical spine, accounting for significant motion artifact. 2. Multilevel osteoarthritic changes of the cervical spine. 3. Large left pleural effusion. Electronically Signed   By: Fidela Salisbury M.D.   On: 04/29/2022 12:52   CT MAXILLOFACIAL WO CONTRAST  Result Date: 05/06/2022 CLINICAL DATA:  Facial trauma EXAM: CT MAXILLOFACIAL WITHOUT CONTRAST TECHNIQUE: Multidetector CT imaging of the maxillofacial structures was performed. Multiplanar CT image reconstructions were also generated. RADIATION DOSE REDUCTION: This exam was performed according to the departmental dose-optimization program which includes automated exposure control, adjustment of the mA and/or kV according to patient size and/or use of iterative reconstruction technique. COMPARISON:  CT head 04/15/2022 FINDINGS: Osseous: There is no acute facial bone fracture. There is no evidence of mandibular dislocation. There is no suspicious osseous lesion. Orbits: The globes are intact.  There is no retrobulbar hematoma. Sinuses: Clear. Soft tissues: Unremarkable. Limited intracranial: Assessed on the separately dictated CT head. Other: There are multiple dental caries and periapical lucencies. IMPRESSION: No acute facial bone fracture. Electronically Signed   By: Valetta Mole M.D.   On: 05/07/2022 12:51   CT HEAD WO CONTRAST  Result  Date: 05/22/2022 CLINICAL DATA:  Mental status change. EXAM: CT HEAD WITHOUT CONTRAST TECHNIQUE: Contiguous axial images were obtained from the base of the skull through the vertex without intravenous contrast. RADIATION DOSE REDUCTION: This exam was performed according  to the departmental dose-optimization program which includes automated exposure control, adjustment of the mA and/or kV according to patient size and/or use of iterative reconstruction technique. COMPARISON:  April 15, 2022 FINDINGS: Brain: No evidence of acute infarction, hemorrhage, hydrocephalus, extra-axial collection or mass lesion/mass effect. Moderate brain parenchymal volume loss and deep white matter microangiopathy. Vascular: Significant calcific atherosclerotic disease of the vertebral arteries and intra cavernous carotid arteries. Skull: Normal. Negative for fracture or focal lesion. Sinuses/Orbits: No acute finding. Other: None. IMPRESSION: 1. No acute intracranial abnormality. 2. Moderate brain parenchymal volume loss and deep white matter microangiopathy. Electronically Signed   By: Fidela Salisbury M.D.   On: 05/25/2022 12:47   DG Chest Port 1 View  Result Date: 05/12/2022 CLINICAL DATA:  Altered mental status, history of diabetes EXAM: PORTABLE CHEST 1 VIEW COMPARISON:  Chest x-ray April 24, 2022 FINDINGS: Unchanged cardiomegaly. Bilateral reticular pulmonary opacities. Large left pleural effusion and moderate right pleural effusion. No large pneumothorax. No acute osseous abnormality. Vascular stent overlying the left axilla. The visualized upper abdomen is unremarkable. IMPRESSION: 1. Large left and moderate right pleural effusions. 2. Bilateral reticular pulmonary opacities, likely moderate pulmonary edema. 3. Cardiomegaly. Electronically Signed   By: Beryle Flock M.D.   On: 05/26/2022 12:10   DG Chest Port 1 View  Result Date: 04/24/2022 CLINICAL DATA:  Pneumonia. EXAM: PORTABLE CHEST 1 VIEW COMPARISON:   Radiographs 04/19/2022 and 04/14/2022.  CT 12/25/2021. FINDINGS: 0937 hours. Patient is rotated to the right. Allowing for this, the heart size and mediastinal contours are stable. The heart appears mildly enlarged. A feeding tube is in place, projecting below the diaphragm, tip not visualized. Diffuse bilateral airspace opacities demonstrate interval improvement on the right. There are probable small bilateral pleural effusions. No evidence of pneumothorax or acute osseous abnormality. Telemetry leads overlie the chest. IMPRESSION: Interval improvement in right lung aeration with persistent diffuse bilateral airspace opacities. Probable small bilateral pleural effusions. Electronically Signed   By: Richardean Sale M.D.   On: 04/24/2022 09:51   DG Chest Port 1 View  Result Date: 04/19/2022 CLINICAL DATA:  Respiratory failure.  COVID positive. EXAM: PORTABLE CHEST 1 VIEW COMPARISON:  04/14/2022 FINDINGS: Interval extubation. Right IJ central line is no longer evident. A feeding tube passes into the stomach although the distal tip position is not included on the film. Bilateral asymmetric airspace disease is similar to prior with confluent disease in the left mid lung and right base. The cardio pericardial silhouette is enlarged. Similar bibasilar collapse/consolidation with small bilateral pleural effusions. The cardio pericardial silhouette is enlarged. Telemetry leads overlie the chest. IMPRESSION: 1. Interval extubation and removal of right IJ central line. 2. Similar appearance of bilateral asymmetric airspace disease with small bilateral pleural effusions. Electronically Signed   By: Misty Stanley M.D.   On: 04/19/2022 10:51   DG Abd Portable 1V  Result Date: 04/18/2022 CLINICAL DATA:  Feeding tube placement EXAM: PORTABLE ABDOMEN - 1 VIEW COMPARISON:  04/14/2022 FINDINGS: Feeding tube with the tip projecting over the stomach. Mild gaseous distension of the small bowel and colon. No bowel dilatation to  suggest bowel obstruction. No evidence of pneumoperitoneum, portal venous gas or pneumatosis. No pathologic calcifications along the expected course of the ureters. No acute osseous abnormality. IMPRESSION: 1. Feeding tube with the tip projecting over the stomach. Electronically Signed   By: Kathreen Devoid M.D.   On: 04/18/2022 15:15    Microbiology: Recent Results (from the past 240 hour(s))  Resp panel by RT-PCR (  RSV, Flu A&B, Covid) Anterior Nasal Swab     Status: None   Collection Time: 05/18/2022 11:32 AM   Specimen: Anterior Nasal Swab  Result Value Ref Range Status   SARS Coronavirus 2 by RT PCR NEGATIVE NEGATIVE Final    Comment: (NOTE) SARS-CoV-2 target nucleic acids are NOT DETECTED.  The SARS-CoV-2 RNA is generally detectable in upper respiratory specimens during the acute phase of infection. The lowest concentration of SARS-CoV-2 viral copies this assay can detect is 138 copies/mL. A negative result does not preclude SARS-Cov-2 infection and should not be used as the sole basis for treatment or other patient management decisions. A negative result may occur with  improper specimen collection/handling, submission of specimen other than nasopharyngeal swab, presence of viral mutation(s) within the areas targeted by this assay, and inadequate number of viral copies(<138 copies/mL). A negative result must be combined with clinical observations, patient history, and epidemiological information. The expected result is Negative.  Fact Sheet for Patients:  EntrepreneurPulse.com.au  Fact Sheet for Healthcare Providers:  IncredibleEmployment.be  This test is no t yet approved or cleared by the [place] FDA and  has been authorized for detection and/or diagnosis of SARS-CoV-2 by FDA under an Emergency Use Authorization (EUA). This EUA will remain  in effect (meaning this test can be used) for the duration of the COVID-19 declaration under  Section 564(b)(1) of the Act, 21 U.S.C.section 360bbb-3(b)(1), unless the authorization is terminated  or revoked sooner.       Influenza A by PCR NEGATIVE NEGATIVE Final   Influenza B by PCR NEGATIVE NEGATIVE Final    Comment: (NOTE) The Xpert Xpress SARS-CoV-2/FLU/RSV plus assay is intended as an aid in the diagnosis of influenza from Nasopharyngeal swab specimens and should not be used as a sole basis for treatment. Nasal washings and aspirates are unacceptable for Xpert Xpress SARS-CoV-2/FLU/RSV testing.  Fact Sheet for Patients: EntrepreneurPulse.com.au  Fact Sheet for Healthcare Providers: IncredibleEmployment.be  This test is not yet approved or cleared by the [place] FDA and has been authorized for detection and/or diagnosis of SARS-CoV-2 by FDA under an Emergency Use Authorization (EUA). This EUA will remain in effect (meaning this test can be used) for the duration of the COVID-19 declaration under Section 564(b)(1) of the Act, 21 U.S.C. section 360bbb-3(b)(1), unless the authorization is terminated or revoked.     Resp Syncytial Virus by PCR NEGATIVE NEGATIVE Final    Comment: (NOTE) Fact Sheet for Patients: EntrepreneurPulse.com.au  Fact Sheet for Healthcare Providers: IncredibleEmployment.be  This test is not yet approved or cleared by the [place] FDA and has been authorized for detection and/or diagnosis of SARS-CoV-2 by FDA under an Emergency Use Authorization (EUA). This EUA will remain in effect (meaning this test can be used) for the duration of the COVID-19 declaration under Section 564(b)(1) of the Act, 21 U.S.C. section 360bbb-3(b)(1), unless the authorization is terminated or revoked.  Performed at [facility], [place] [address] [place], [place] [address]   Blood Culture (Routine X 2)     Status: None (Preliminary result)   Collection Time: 05/04/2022 12:00 PM    Specimen: BLOOD RIGHT ARM  Result Value Ref Range Status   Specimen Description BLOOD RIGHT ARM  Final   Special Requests   Final    BOTTLES DRAWN AEROBIC AND ANAEROBIC Blood Culture results may not be optimal due to an inadequate volume of blood received in culture bottles   Culture   Final    NO  GROWTH < 24 HOURS Performed at [facility], [place] [address], [place], [place] [address]    Report Status PENDING  Incomplete  Blood Culture (Routine X 2)     Status: None (Preliminary result)   Collection Time: 05/27/2022  5:16 PM   Specimen: BLOOD RIGHT HAND  Result Value Ref Range Status   Specimen Description BLOOD RIGHT HAND  Final   Special Requests   Final    BOTTLES DRAWN AEROBIC AND ANAEROBIC Blood Culture adequate volume   Culture   Final    NO GROWTH < 24 HOURS Performed at [facility], [place] [address], [place], [place] [address]    Report Status PENDING  Incomplete    Time spent: 50 minutes  Signed: Debbe Odea, MD Jun 14, 2022

## 2022-05-29 NOTE — ED Notes (Signed)
Physician notified of time of death 49.

## 2022-05-29 NOTE — Progress Notes (Signed)
Noted that pt has transitioned to comfort care. No further dialysis. Will sign off. Please call if I can assist with anything.    Madelon Lips MD [facility] [phone]

## 2022-05-29 NOTE — ED Notes (Signed)
Patient placement notified.

## 2022-05-29 NOTE — ED Notes (Signed)
Patient placed in bag, bag tagged and taken to morgue at this time.

## 2022-05-29 NOTE — ED Notes (Signed)
Patient becoming a little restless. Pulled EKG wires off.

## 2022-05-29 DEATH — deceased

## 2023-08-06 IMAGING — DX DG CHEST 2V
2 series · 2 of 2 positions shown · non-contrast
Comparison: None.

CLINICAL DATA: Chest pain

EXAM:
CHEST - 2 VIEW

[chest lat]
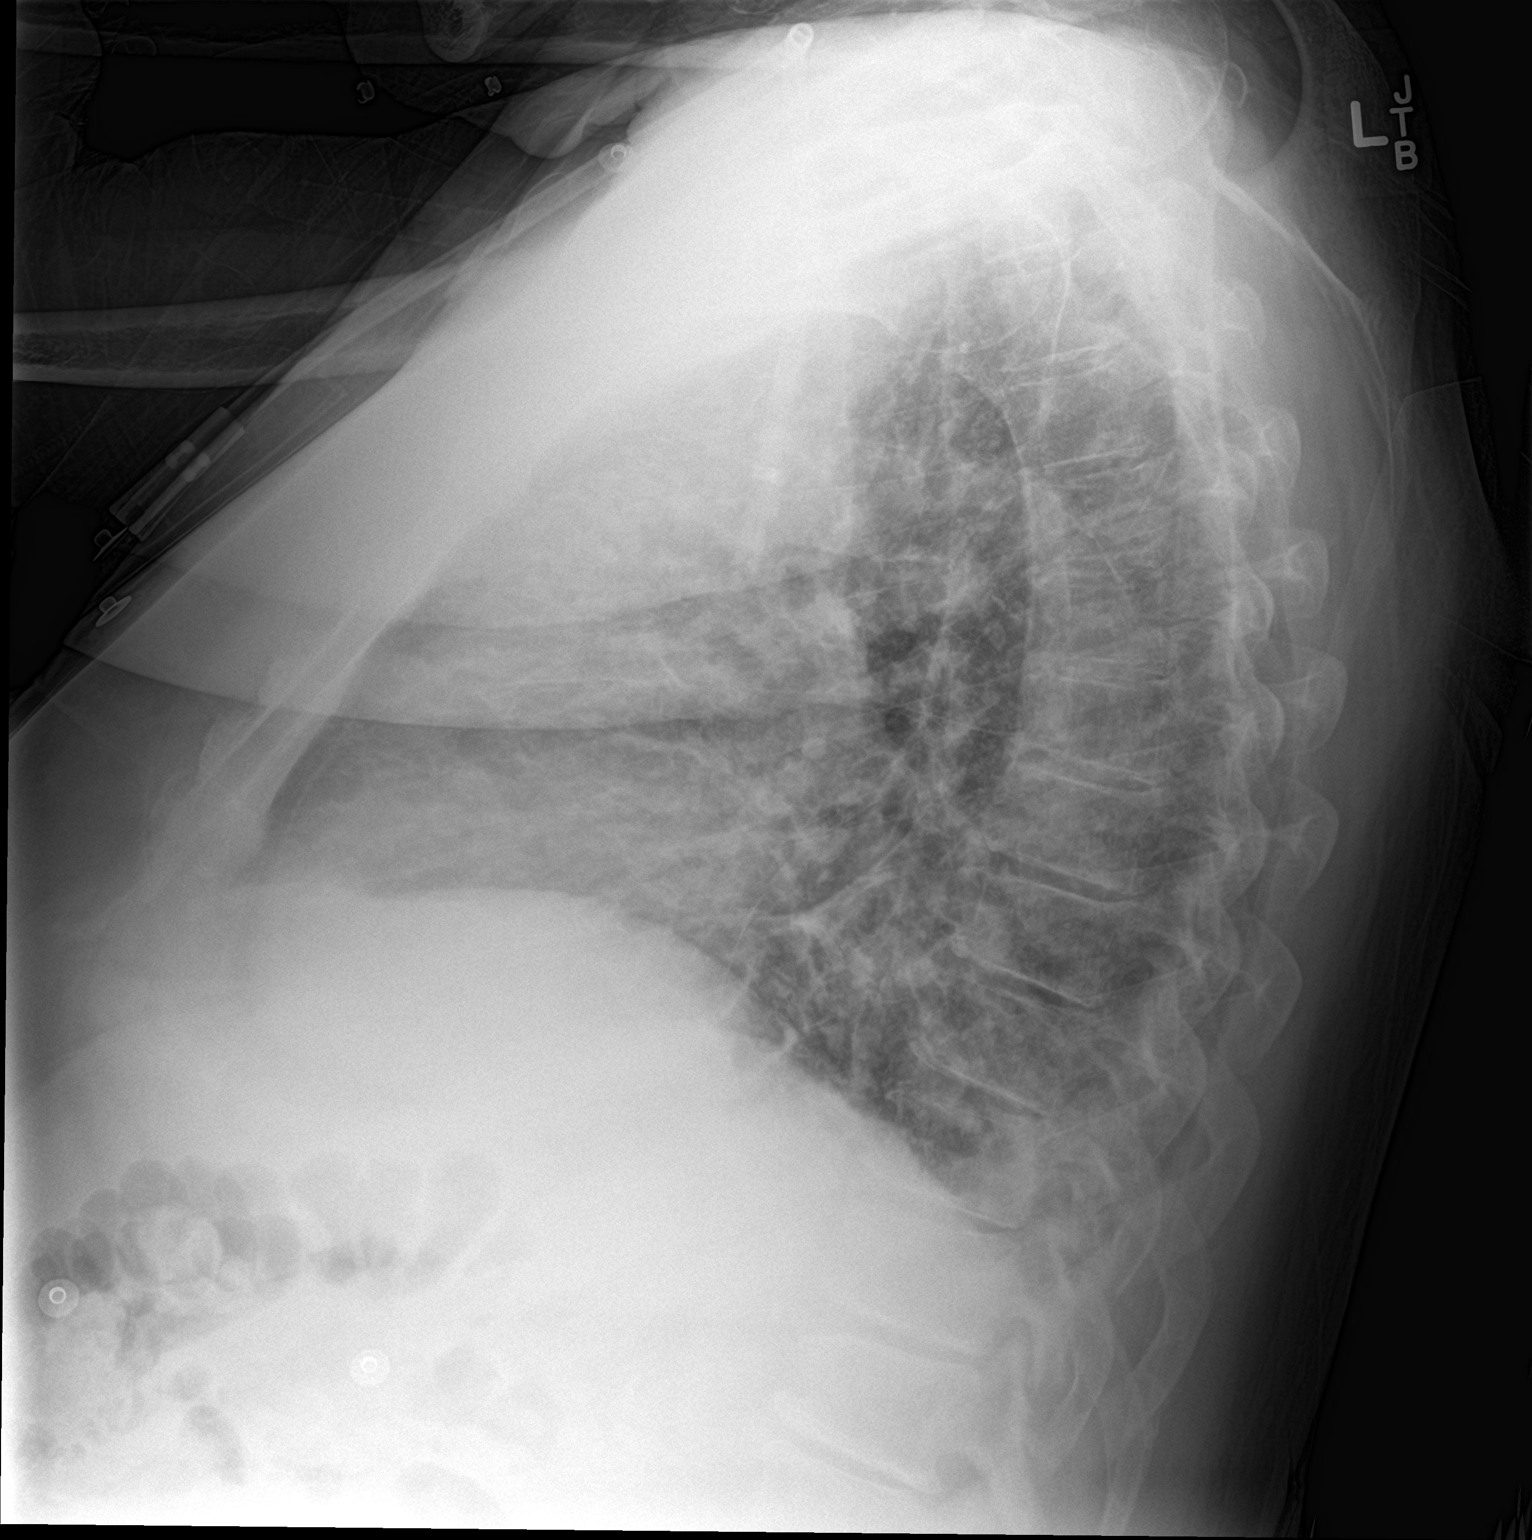

[chest ap]
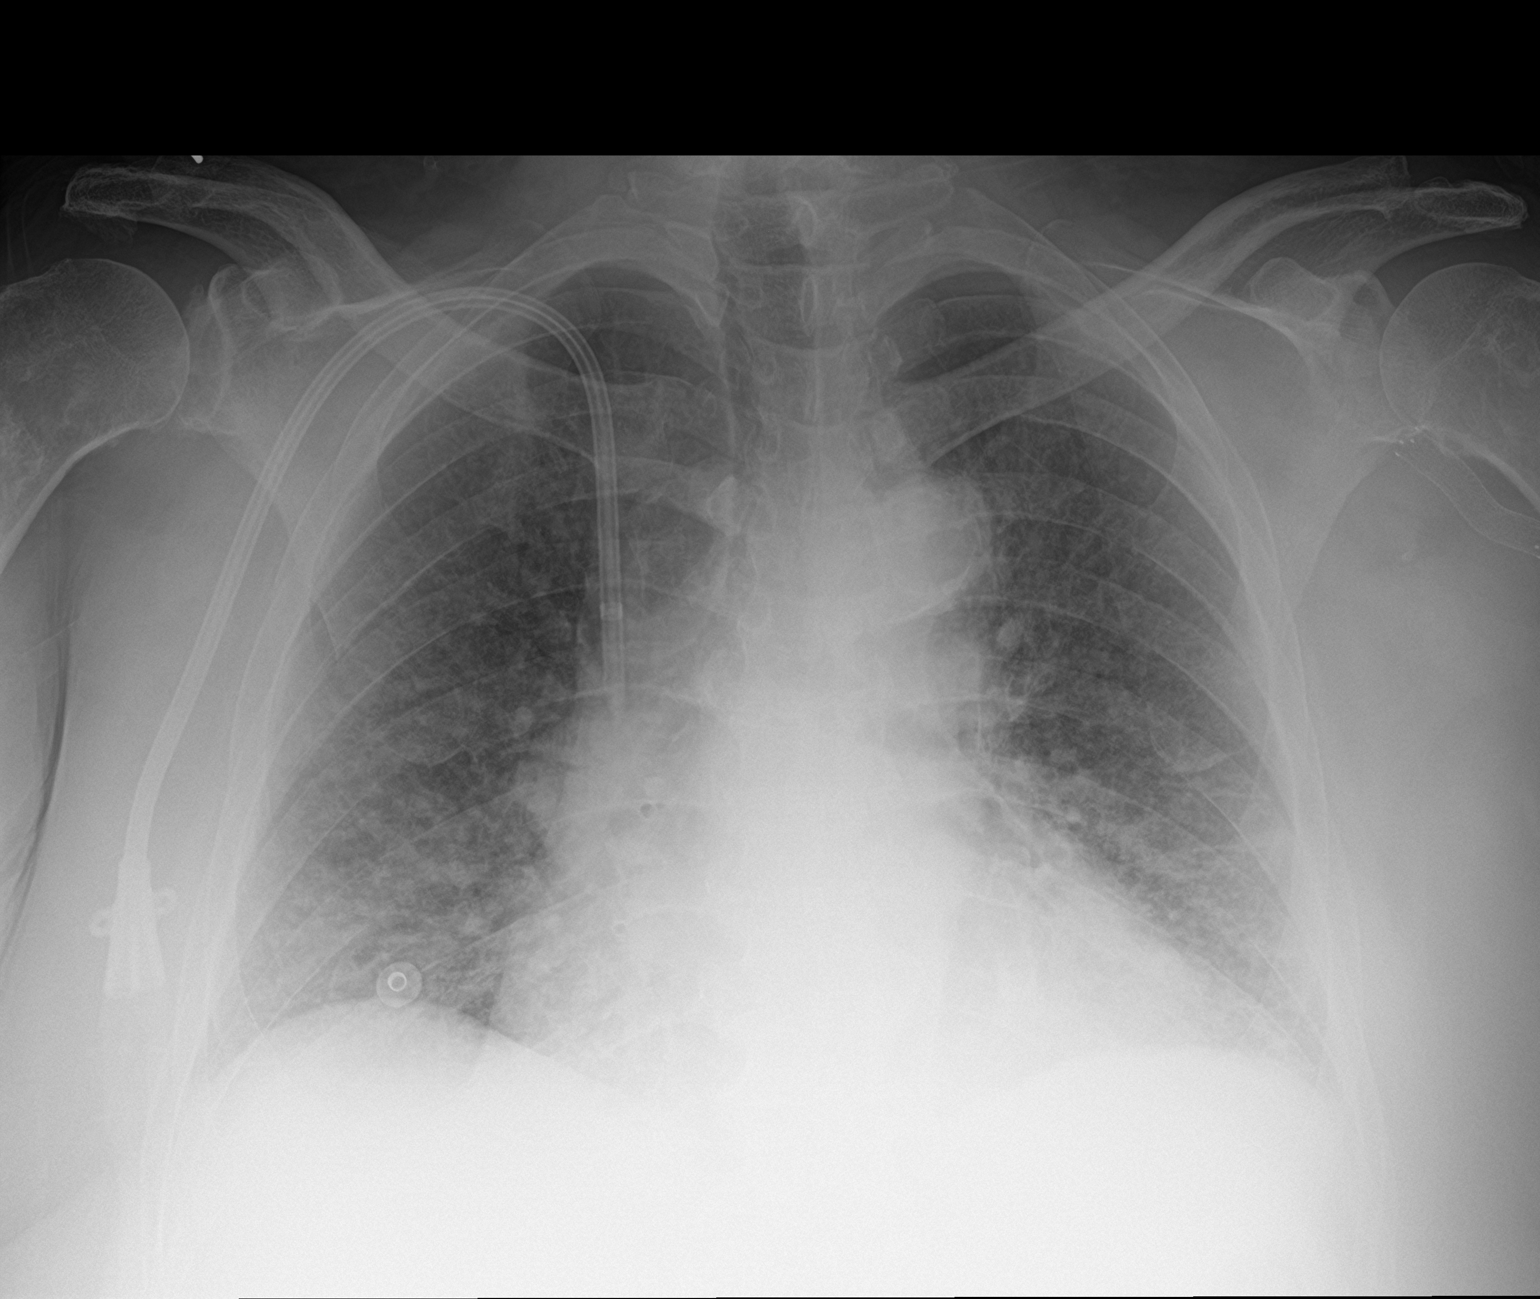

[2 of 2 positions shown; findings below may reference images not displayed]

FINDINGS: Right neck catheter tip overlies the mid superior vena cava.
Unchanged, enlarged cardiac silhouette. There are diffuse
interstitial opacities. There are lower lung alveolar opacities.
Trace effusions. No visible pneumothorax. No acute osseous
abnormality. Left axillary stent.
IMPRESSION: Mild pulmonary edema and trace bilateral pleural effusions.
Cardiomegaly.

## 2023-08-07 IMAGING — CT CT TIBIA FIBULA *L* W/ CM
3 of 4 series · 13 of 33 positions shown, 16 images · IV contrast (agent unspecified)
Comparison: CT examination dated January 05, 2021

CLINICAL DATA: Lower leg trauma, status post left BKA [DATE] with
pain.

EXAM:
CT OF THE LOWER LEFT EXTREMITY WITH CONTRAST
TECHNIQUE: Multidetector CT imaging of the lower left extremity was performed
according to the standard protocol following intravenous contrast
administration.

[Series 4: lfov ext 3.0 b40s · axial · 0.56mm/px · z∈[+374,+614]mm · 7 of 96 slices shown, 9 images]
[im 8/96  soft-tissue]
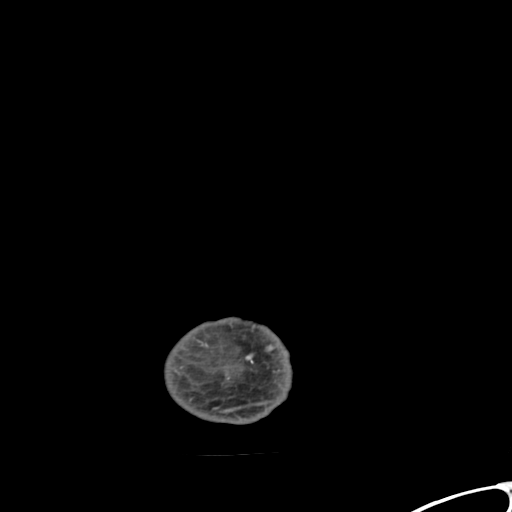
[im 8/96  bone]
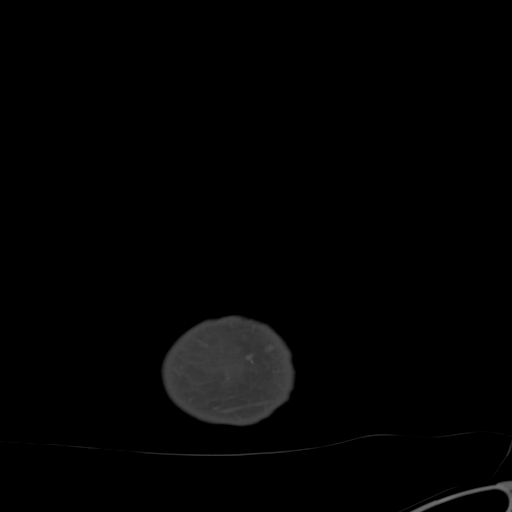
[im 22/96  bone]
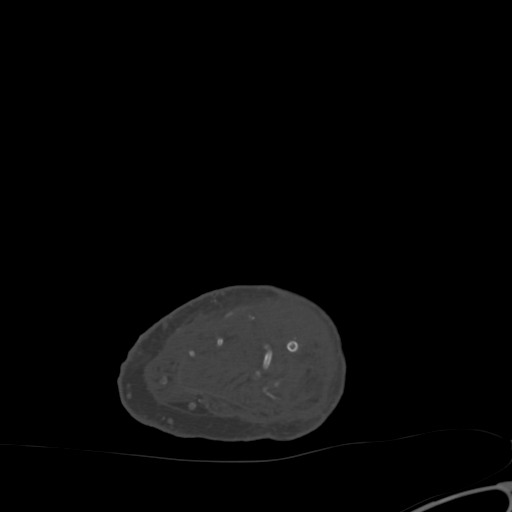
[im 37/96  bone]
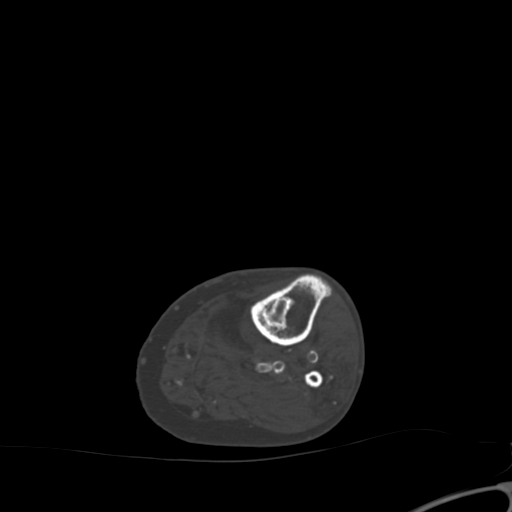
[im 52/96  bone]
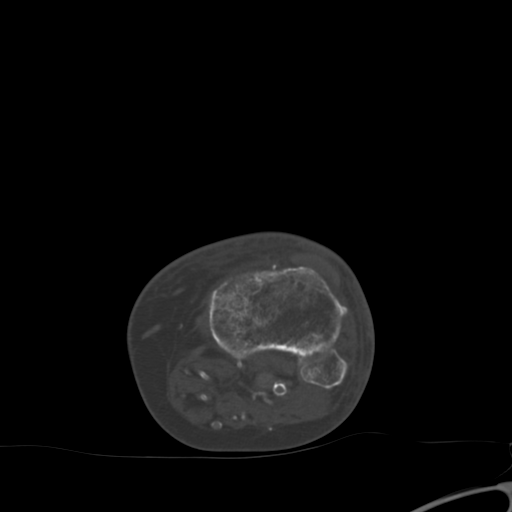
[im 59/96  soft-tissue]
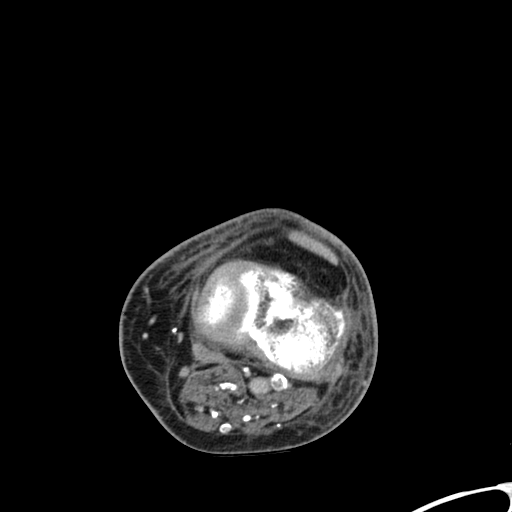
[im 59/96  bone]
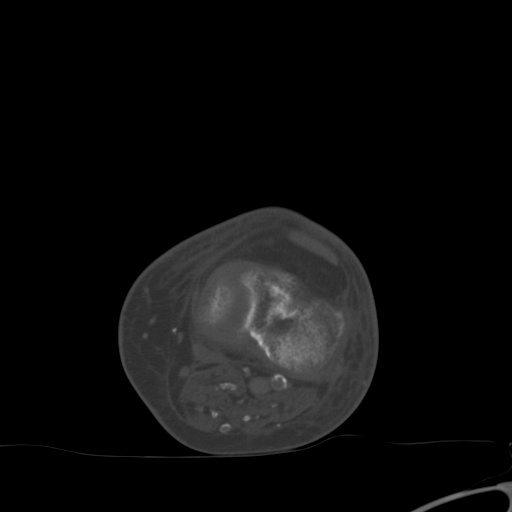
[im 74/96  bone]
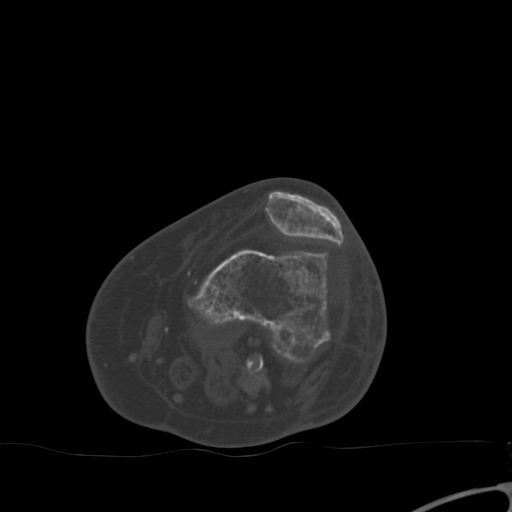
[im 88/96  bone]
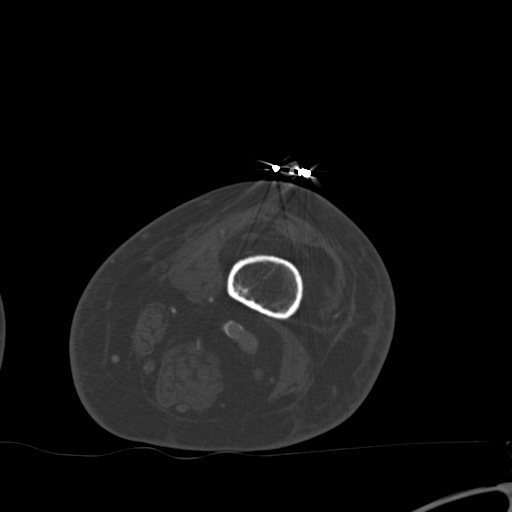

[Series 5: coronal bone · coronal · 0.45mm/px · 1 of 126 slices shown]
[im 63/126  bone]
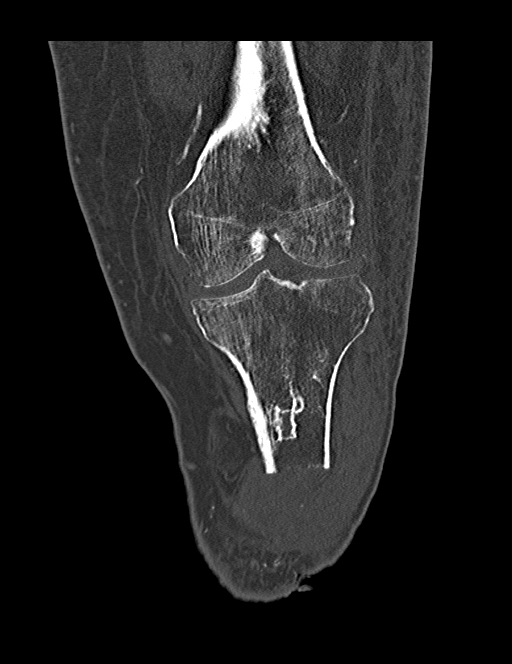

[Series 8: sagittalsoft tissue · sagittal · 0.39mm/px · 5 of 111 slices shown, 6 images]
[im 37/111  bone]
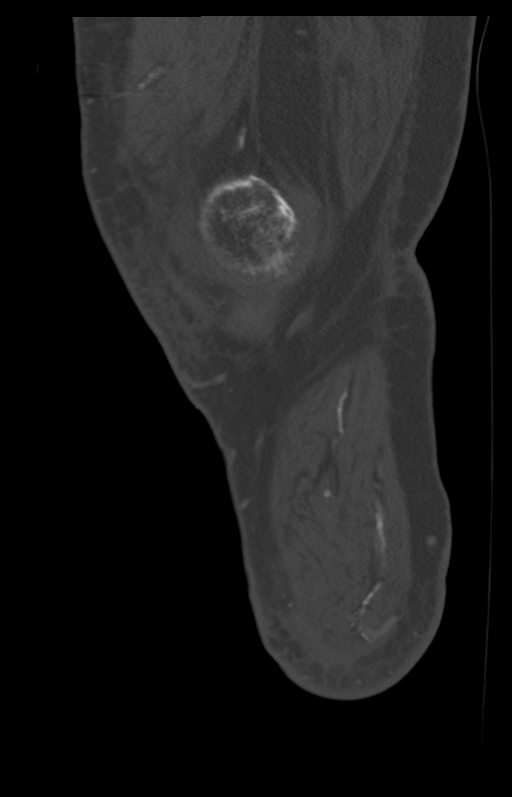
[im 46/111  bone]
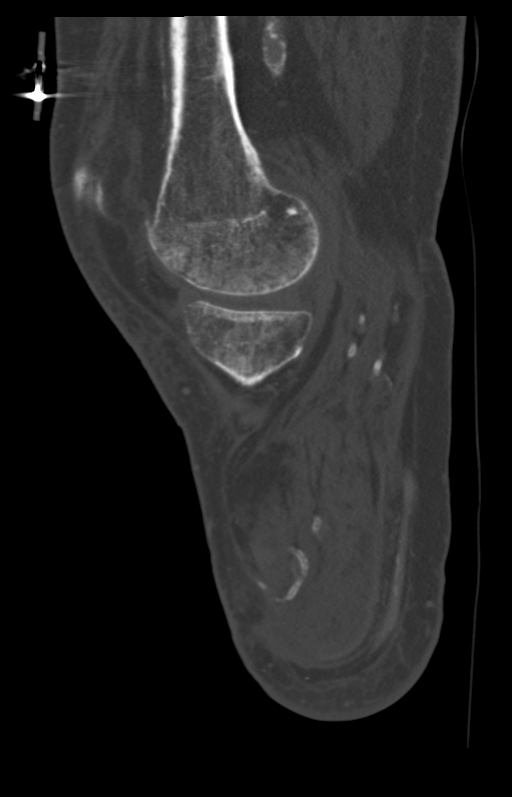
[im 56/111  soft-tissue]
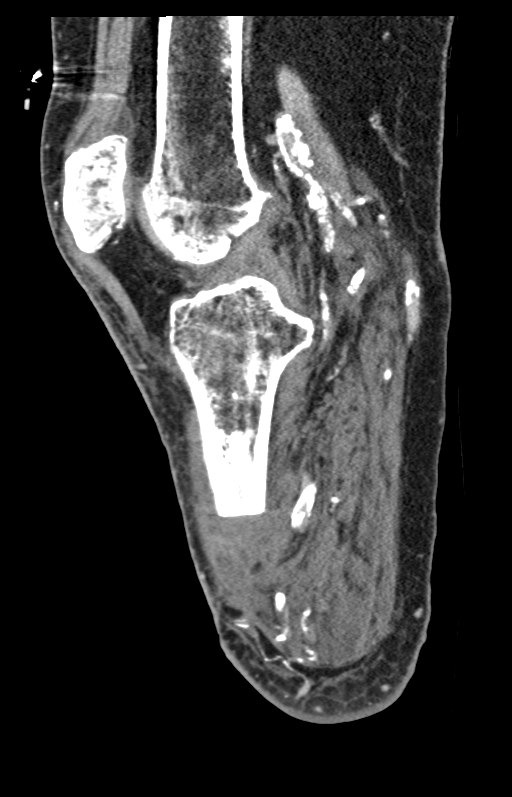
[im 56/111  bone]
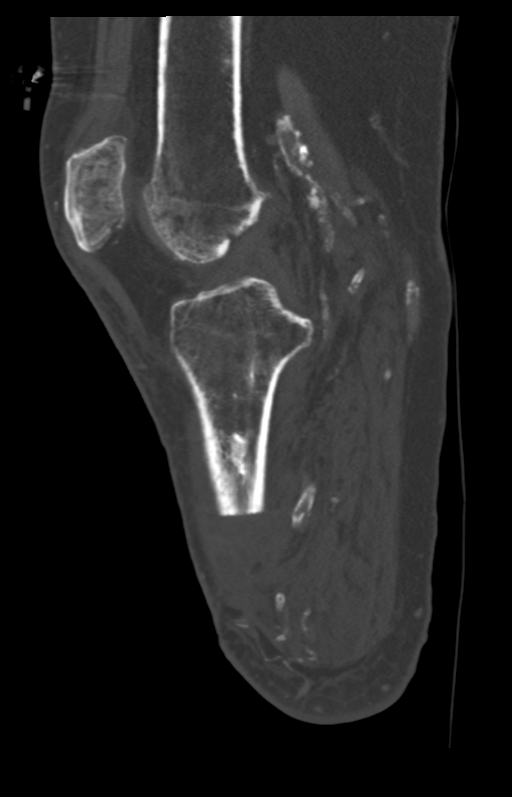
[im 65/111  bone]
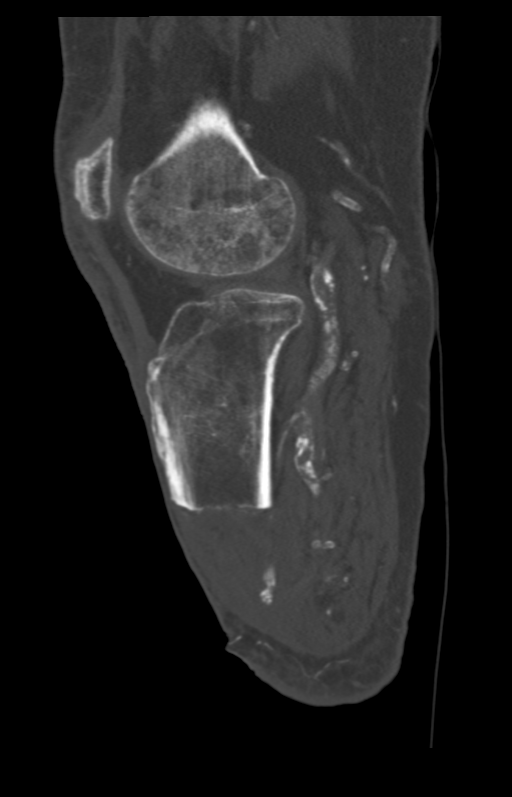
[im 74/111  bone]
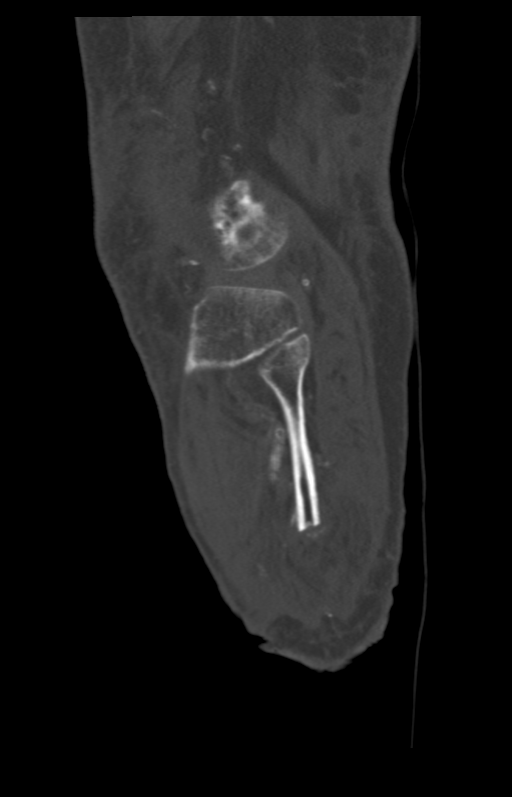

[13 of 33 positions shown; findings below may reference images not displayed]

RADIATION DOSE REDUCTION: This exam was performed according to the
departmental dose-optimization program which includes automated
exposure control, adjustment of the mA and/or kV according to
patient size and/or use of iterative reconstruction technique.

CONTRAST:  100mL OMNIPAQUE IOHEXOL 350 MG/ML SOLN
FINDINGS: Bones/Joint/Cartilage

Status post below-knee amputation. No fracture or dislocation. No
cortical erosion or periosteal reaction. Sclerotic lesion in the
tibia, likely chronic bone infarct, unchanged. Normal alignment. No
joint effusion.

Ligaments

Ligaments are suboptimally evaluated by CT.

Muscles and Tendons
No intramuscular hematoma fluid collection.

Soft tissue
Mild skin thickening and subcutaneous soft tissue edema consistent
with mild cellulitis. No drainable fluid collection or abscess.
Prominent vascular calcifications.
IMPRESSION: 1. Postsurgical changes for prior below-knee amputation without
evidence of fracture or acute osseous abnormality.
2. No evidence of osteomyelitis.
3. Mild subcutaneous soft tissue swelling and edema without evidence
of drainable fluid collection or abscess.
4. Prominent vascular calcifications.
